# Patient Record
Sex: Female | Born: 1970 | Race: Black or African American | Hispanic: No | Marital: Single | Smoking: Never smoker
Health system: Southern US, Community
[De-identification: ages and names within clinical notes are randomized; demographics above are authoritative.]

## PROBLEM LIST (undated history)

## (undated) DIAGNOSIS — N289 Disorder of kidney and ureter, unspecified: Secondary | ICD-10-CM

## (undated) DIAGNOSIS — R7881 Bacteremia: Secondary | ICD-10-CM

## (undated) DIAGNOSIS — I5189 Other ill-defined heart diseases: Secondary | ICD-10-CM

## (undated) DIAGNOSIS — E7800 Pure hypercholesterolemia, unspecified: Secondary | ICD-10-CM

## (undated) DIAGNOSIS — N186 End stage renal disease: Secondary | ICD-10-CM

## (undated) DIAGNOSIS — M1990 Unspecified osteoarthritis, unspecified site: Secondary | ICD-10-CM

## (undated) DIAGNOSIS — I161 Hypertensive emergency: Secondary | ICD-10-CM

## (undated) DIAGNOSIS — M109 Gout, unspecified: Secondary | ICD-10-CM

## (undated) DIAGNOSIS — E119 Type 2 diabetes mellitus without complications: Secondary | ICD-10-CM

## (undated) DIAGNOSIS — I340 Nonrheumatic mitral (valve) insufficiency: Secondary | ICD-10-CM

## (undated) DIAGNOSIS — T829XXD Unspecified complication of cardiac and vascular prosthetic device, implant and graft, subsequent encounter: Secondary | ICD-10-CM

## (undated) DIAGNOSIS — I10 Essential (primary) hypertension: Secondary | ICD-10-CM

## (undated) DIAGNOSIS — I5022 Chronic systolic (congestive) heart failure: Secondary | ICD-10-CM

## (undated) HISTORY — DX: Other ill-defined heart diseases: I51.89

## (undated) HISTORY — PX: AV FISTULA PLACEMENT: SHX1204

## (undated) HISTORY — DX: Chronic systolic (congestive) heart failure: I50.22

## (undated) HISTORY — DX: Nonrheumatic mitral (valve) insufficiency: I34.0

## (undated) HISTORY — DX: Other disorders of calcium metabolism: E83.59

---

## 2019-05-11 ENCOUNTER — Emergency Department (HOSPITAL_COMMUNITY)
Admission: EM | Admit: 2019-05-11 | Discharge: 2019-05-11 | Disposition: A | Discharge status: Home / Self Care | Payer: Medicare Other | Attending: Emergency Medicine | Admitting: Emergency Medicine

## 2019-05-11 ENCOUNTER — Other Ambulatory Visit: Payer: Self-pay

## 2019-05-11 ENCOUNTER — Emergency Department (HOSPITAL_COMMUNITY): Payer: Medicare Other

## 2019-05-11 ENCOUNTER — Encounter (HOSPITAL_COMMUNITY): Payer: Self-pay | Admitting: Emergency Medicine

## 2019-05-11 DIAGNOSIS — Z20822 Contact with and (suspected) exposure to covid-19: Secondary | ICD-10-CM | POA: Insufficient documentation

## 2019-05-11 DIAGNOSIS — Z992 Dependence on renal dialysis: Secondary | ICD-10-CM | POA: Insufficient documentation

## 2019-05-11 DIAGNOSIS — Z7984 Long term (current) use of oral hypoglycemic drugs: Secondary | ICD-10-CM | POA: Diagnosis not present

## 2019-05-11 DIAGNOSIS — N186 End stage renal disease: Secondary | ICD-10-CM | POA: Insufficient documentation

## 2019-05-11 DIAGNOSIS — E1122 Type 2 diabetes mellitus with diabetic chronic kidney disease: Secondary | ICD-10-CM | POA: Insufficient documentation

## 2019-05-11 DIAGNOSIS — R112 Nausea with vomiting, unspecified: Secondary | ICD-10-CM | POA: Diagnosis present

## 2019-05-11 DIAGNOSIS — Z79899 Other long term (current) drug therapy: Secondary | ICD-10-CM | POA: Diagnosis not present

## 2019-05-11 DIAGNOSIS — I120 Hypertensive chronic kidney disease with stage 5 chronic kidney disease or end stage renal disease: Secondary | ICD-10-CM | POA: Diagnosis not present

## 2019-05-11 HISTORY — DX: Disorder of kidney and ureter, unspecified: N28.9

## 2019-05-11 HISTORY — DX: Type 2 diabetes mellitus without complications: E11.9

## 2019-05-11 HISTORY — DX: Pure hypercholesterolemia, unspecified: E78.00

## 2019-05-11 HISTORY — DX: Essential (primary) hypertension: I10

## 2019-05-11 LAB — COMPREHENSIVE METABOLIC PANEL
ALT: 18 U/L (ref 0–44)
AST: 18 U/L (ref 15–41)
Albumin: 4 g/dL (ref 3.5–5.0)
Alkaline Phosphatase: 69 U/L (ref 38–126)
Anion gap: 14 (ref 5–15)
BUN: 44 mg/dL — ABNORMAL HIGH (ref 6–20)
CO2: 26 mmol/L (ref 22–32)
Calcium: 8.8 mg/dL — ABNORMAL LOW (ref 8.9–10.3)
Chloride: 95 mmol/L — ABNORMAL LOW (ref 98–111)
Creatinine, Ser: 10.25 mg/dL — ABNORMAL HIGH (ref 0.44–1.00)
GFR calc Af Amer: 5 mL/min — ABNORMAL LOW (ref >60)
GFR calc non Af Amer: 4 mL/min — ABNORMAL LOW (ref >60)
Glucose, Bld: 313 mg/dL — ABNORMAL HIGH (ref 70–99)
Potassium: 5.2 mmol/L — ABNORMAL HIGH (ref 3.5–5.1)
Sodium: 135 mmol/L (ref 135–145)
Total Bilirubin: 0.6 mg/dL (ref 0.3–1.2)
Total Protein: 7.8 g/dL (ref 6.5–8.1)

## 2019-05-11 LAB — CBC
HCT: 23.9 % — ABNORMAL LOW (ref 36.0–46.0)
Hemoglobin: 7.7 g/dL — ABNORMAL LOW (ref 12.0–15.0)
MCH: 30.4 pg (ref 26.0–34.0)
MCHC: 32.2 g/dL (ref 30.0–36.0)
MCV: 94.5 fL (ref 80.0–100.0)
Platelets: 164 10*3/uL (ref 150–400)
RBC: 2.53 MIL/uL — ABNORMAL LOW (ref 3.87–5.11)
RDW: 14.6 % (ref 11.5–15.5)
WBC: 9 10*3/uL (ref 4.0–10.5)
nRBC: 0 % (ref 0.0–0.2)

## 2019-05-11 LAB — LIPASE, BLOOD: Lipase: 64 U/L — ABNORMAL HIGH (ref 11–51)

## 2019-05-11 LAB — RESPIRATORY PANEL BY RT PCR (FLU A&B, COVID)
Influenza A by PCR: NEGATIVE
Influenza B by PCR: NEGATIVE
SARS Coronavirus 2 by RT PCR: NEGATIVE

## 2019-05-11 MED ORDER — ONDANSETRON HCL 4 MG PO TABS: 4.0000 mg | ORAL_TABLET | Freq: Four times a day (QID) | ORAL | 0 refills | Status: DC

## 2019-05-11 MED ORDER — ONDANSETRON HCL 4 MG/2ML IJ SOLN
4.0000 mg | Freq: Once | INTRAMUSCULAR | Status: AC
  Administered 2019-05-11: 4 mg via INTRAVENOUS
  Filled 2019-05-11: qty 2

## 2019-05-11 NOTE — ED Triage Notes (Signed)
Pt reports emesis, abd pain, frequent bowel movements. Pt reports had dialysis yesterday and was at dialysis today for sequential session but reports due to pt emesis did not want to perform dialysis. Pt reports dyspnea with exertion and reports " I get this way when I don't have dialysis."

## 2019-05-11 NOTE — ED Provider Notes (Signed)
[facility] EMERGENCY DEPARTMENT Provider Note   CSN: [id number] Arrival date & time: 05/11/19  1143     History Chief Complaint  Patient presents with  . Emesis    Courtney Gray is a 49 y.o. female.  Patient is a 49 year old obese female with past medical history of diabetes, hypercholesterolemia, hypertension, end-stage renal disease on dialysis presenting to the emergency department for nausea, vomiting for the last couple of days.  Patient reports that she completed dialysis yesterday and returned today for subsequent to have treatment which she is usually scheduled for but was told to come and be evaluated in the emergency department due to her symptoms.  She reports that she has had about 4 episodes of nonbloody vomiting in the last 2 days with generalized stomach upset.  She makes only a small amount of urine per day since dialysis.  Denies any diarrhea.  Her thinks she might have had a fever with a temporal thermometer today at dialysis.  Also reports congestion and runny nose for the last several days.  Denies any chest pain, shortness of breath, leg swelling, hemoptysis        Past Medical History:  Diagnosis Date  . Diabetes mellitus without complication ([place])   . High cholesterol   . Hypertension   . Renal disorder     There are no problems to display for this patient.   Past Surgical History:  Procedure Laterality Date  . AV FISTULA PLACEMENT     x2, unsuccessful.      OB History   No obstetric history on file.     History reviewed. No pertinent family history.  Social History   Tobacco Use  . Smoking status: Never Smoker  . Smokeless tobacco: Never Used  Substance Use Topics  . Alcohol use: Never  . Drug use: Never    Home Medications Prior to Admission medications   Medication Sig Start Date End Date Taking? Authorizing Provider  amLODipine (NORVASC) 10 MG tablet Take 1 tablet by mouth daily. 03/27/18  Yes [provider]    atorvastatin (LIPITOR) 10 MG tablet Take 10 mg by mouth at bedtime. 03/29/19  Yes [provider]  carvedilol (COREG) 25 MG tablet Take 25 mg by mouth 2 (two) times daily. 04/20/19  Yes [provider]  gabapentin (NEURONTIN) 100 MG capsule Take 1 capsule by mouth at bedtime. 04/20/19  Yes [provider]  glipiZIDE (GLUCOTROL) 5 MG tablet Take 5 mg by mouth 2 (two) times daily. 03/18/19  Yes [provider]  LANTUS SOLOSTAR 100 UNIT/ML Solostar Pen Inject 10 Units into the skin at bedtime. 03/18/19  Yes [provider]  lisinopril (ZESTRIL) 40 MG tablet Take 40 mg by mouth daily. 04/20/19  Yes [provider]  tiZANidine (ZANAFLEX) 4 MG tablet Take 4 mg by mouth 3 (three) times daily. 04/20/19  Yes [provider]  ondansetron (ZOFRAN) 4 MG tablet Take 1 tablet (4 mg total) by mouth every 6 (six) hours. 05/11/19   Alveria Apley, PA-C    Allergies    Benzonatate, Hydralazine, Hydrocodone, Morphine, and Oxycodone  Review of Systems   Review of Systems  Constitutional: Positive for fatigue and fever. Negative for appetite change and chills.  HENT: Positive for congestion, rhinorrhea and sinus pain.   Respiratory: Negative for cough and shortness of breath.   Cardiovascular: Negative for chest pain.  Gastrointestinal: Positive for abdominal pain, nausea and vomiting. Negative for diarrhea.  Genitourinary: Negative for dysuria and pelvic  pain.  Musculoskeletal: Negative for arthralgias and myalgias.  Skin: Negative for rash.  Neurological: Negative for dizziness, light-headedness and headaches.  Hematological: Does not bruise/bleed easily.    Physical Exam Updated Vital Signs BP (!) 157/74   Pulse 66   Temp 98.9 F (37.2 C)   Resp 20   Ht 5\' 5"  (1.651 m)   Wt (!) 158.8 kg   SpO2 95%   BMI 58.24 kg/m   Physical Exam Vitals and nursing note reviewed.  Constitutional:      General: She is not in acute distress.     Appearance: Normal appearance. She is obese. She is not ill-appearing, toxic-appearing or diaphoretic.  HENT:     Head: Normocephalic and atraumatic.     Mouth/Throat:     Mouth: Mucous membranes are moist.  Eyes:     Conjunctiva/sclera: Conjunctivae normal.  Cardiovascular:     Rate and Rhythm: Normal rate and regular rhythm.     Pulses: Normal pulses.  Pulmonary:     Effort: Pulmonary effort is normal.     Breath sounds: Normal breath sounds.  Abdominal:     Palpations: Abdomen is soft.     Tenderness: There is abdominal tenderness (very mild and diffuse). There is no right CVA tenderness, left CVA tenderness, guarding or rebound.  Skin:    General: Skin is warm and dry.     Capillary Refill: Capillary refill takes less than 2 seconds.     Coloration: Skin is not jaundiced.     Findings: No bruising or erythema.  Neurological:     Mental Status: She is alert.  Psychiatric:        Mood and Affect: Mood normal.     ED Results / Procedures / Treatments   Labs (all labs ordered are listed, but only abnormal results are displayed) Labs Reviewed  LIPASE, BLOOD - Abnormal; Notable for the following components:      Result Value   Lipase 64 (*)    All other components within normal limits  COMPREHENSIVE METABOLIC PANEL - Abnormal; Notable for the following components:   Potassium 5.2 (*)    Chloride 95 (*)    Glucose, Bld 313 (*)    BUN 44 (*)    Creatinine, Ser 10.25 (*)    Calcium 8.8 (*)    GFR calc non Af Amer 4 (*)    GFR calc Af Amer 5 (*)    All other components within normal limits  CBC - Abnormal; Notable for the following components:   RBC 2.53 (*)    Hemoglobin 7.7 (*)    HCT 23.9 (*)    All other components within normal limits  RESPIRATORY PANEL BY RT PCR (FLU A&B, COVID)    EKG None  Radiology DG Chest Portable 1 View  Result Date: 05/11/2019 CLINICAL DATA:  Vomiting, abdominal pain, frequent bowel movements, had dialysis yesterday, congestion,  diabetes mellitus, hypertension EXAM: PORTABLE CHEST 1 VIEW COMPARISON:  Portable exam 1247 hours without priors for comparison FINDINGS: RIGHT jugular dual-lumen central venous catheter with tip projecting over high RIGHT atrium. Enlargement of cardiac silhouette with pulmonary vascular congestion. Lungs clear. No definite infiltrate, pleural effusion or pneumothorax. IMPRESSION: Enlargement of cardiac silhouette with pulmonary vascular congestion. No definite acute infiltrate. Electronically Signed   By: Lavonia Dana M.D.   On: 05/11/2019 13:01    Procedures Procedures (including critical care time)  Medications Ordered in ED Medications  ondansetron (ZOFRAN) injection 4 mg (4 mg Intravenous Given 05/11/19  1255)    ED Course  I have reviewed the triage vital signs and the nursing notes.  Pertinent labs & imaging results that were available during my care of the patient were reviewed by me and considered in my medical decision making (see chart for details).  Clinical Course as of May 12 821  Wed May 11, 2019  1357 49 y/o presenting from dialysis due to n/v and diffuse abd pain for the last few days. Patient feels like "I have a virus". Patient appears well on my exam. She is mildly hypertensive but this may be due to the fact that she is in need of some dialysis. She completed a session yesterday but was to go for an additional 2 hours today which is not unusual for her. Her workup is largely negative. She has no significant pain or peritoneal signs on my exam either. Discussed with Dr. Roderic Palau and we agree she is ok for d/c. Will send home with rx for zofran. She is due for dialysis tomorrow again and has appt set up   [KM]    Clinical Course User Index [KM] Kristine Royal   MDM Rules/Calculators/A&P                      Based on review of vitals, medical screening exam, lab work and/or imaging, there does not appear to be an acute, emergent etiology for the patient's symptoms.  Counseled pt on good return precautions and encouraged both PCP and ED follow-up as needed.  Prior to discharge, I also discussed incidental imaging findings with patient in detail and advised appropriate, recommended follow-up in detail.  Clinical Impression: 1. Non-intractable vomiting with nausea, unspecified vomiting type     Disposition: Discharge  Prior to providing a prescription for a controlled substance, I independently reviewed the patient's recent prescription history on the [facility]. The patient had no recent or regular prescriptions and was deemed appropriate for a brief, less than 3 day prescription of narcotic for acute analgesia.  This note was prepared with assistance of Systems analyst. Occasional wrong-word or sound-a-like substitutions may have occurred due to the inherent limitations of voice recognition software.  Final Clinical Impression(s) / ED Diagnoses Final diagnoses:  Non-intractable vomiting with nausea, unspecified vomiting type    Rx / DC Orders ED Discharge Orders         Ordered    ondansetron (ZOFRAN) 4 MG tablet  Every 6 hours     05/11/19 1409           Kristine Royal 05/13/19 [id number]    Milton Ferguson, MD 05/16/19 [phone]

## 2019-05-11 NOTE — Discharge Instructions (Signed)
Thank you for allowing me to care for you today. Please return to the emergency department if you have new or worsening symptoms. Take your medications as instructed.  ° °

## 2019-05-19 ENCOUNTER — Other Ambulatory Visit (HOSPITAL_COMMUNITY): Payer: Self-pay | Admitting: Nephrology

## 2019-05-19 DIAGNOSIS — Z992 Dependence on renal dialysis: Secondary | ICD-10-CM

## 2019-05-23 ENCOUNTER — Ambulatory Visit (HOSPITAL_COMMUNITY): Payer: Medicare Other

## 2019-05-26 ENCOUNTER — Other Ambulatory Visit: Payer: Self-pay | Admitting: Radiology

## 2019-05-27 ENCOUNTER — Encounter (HOSPITAL_COMMUNITY): Payer: Self-pay

## 2019-05-27 ENCOUNTER — Ambulatory Visit (HOSPITAL_COMMUNITY): Admission: RE | Admit: 2019-05-27 | Payer: Medicare Other | Source: Ambulatory Visit

## 2019-06-06 DIAGNOSIS — E8770 Fluid overload, unspecified: Secondary | ICD-10-CM | POA: Diagnosis not present

## 2019-06-06 DIAGNOSIS — E8779 Other fluid overload: Secondary | ICD-10-CM | POA: Diagnosis not present

## 2019-06-06 DIAGNOSIS — Z23 Encounter for immunization: Secondary | ICD-10-CM | POA: Diagnosis not present

## 2019-06-06 DIAGNOSIS — N186 End stage renal disease: Secondary | ICD-10-CM | POA: Diagnosis not present

## 2019-06-06 DIAGNOSIS — Z992 Dependence on renal dialysis: Secondary | ICD-10-CM | POA: Diagnosis not present

## 2019-06-07 DIAGNOSIS — Z23 Encounter for immunization: Secondary | ICD-10-CM | POA: Diagnosis not present

## 2019-06-07 DIAGNOSIS — E8770 Fluid overload, unspecified: Secondary | ICD-10-CM | POA: Diagnosis not present

## 2019-06-07 DIAGNOSIS — N186 End stage renal disease: Secondary | ICD-10-CM | POA: Diagnosis not present

## 2019-06-07 DIAGNOSIS — Z992 Dependence on renal dialysis: Secondary | ICD-10-CM | POA: Diagnosis not present

## 2019-06-07 DIAGNOSIS — E8779 Other fluid overload: Secondary | ICD-10-CM | POA: Diagnosis not present

## 2019-06-07 DIAGNOSIS — N2581 Secondary hyperparathyroidism of renal origin: Secondary | ICD-10-CM | POA: Diagnosis not present

## 2019-06-09 DIAGNOSIS — E8770 Fluid overload, unspecified: Secondary | ICD-10-CM | POA: Diagnosis not present

## 2019-06-09 DIAGNOSIS — E8779 Other fluid overload: Secondary | ICD-10-CM | POA: Diagnosis not present

## 2019-06-09 DIAGNOSIS — N186 End stage renal disease: Secondary | ICD-10-CM | POA: Diagnosis not present

## 2019-06-09 DIAGNOSIS — Z23 Encounter for immunization: Secondary | ICD-10-CM | POA: Diagnosis not present

## 2019-06-09 DIAGNOSIS — Z992 Dependence on renal dialysis: Secondary | ICD-10-CM | POA: Diagnosis not present

## 2019-06-11 DIAGNOSIS — E8779 Other fluid overload: Secondary | ICD-10-CM | POA: Diagnosis not present

## 2019-06-11 DIAGNOSIS — Z23 Encounter for immunization: Secondary | ICD-10-CM | POA: Diagnosis not present

## 2019-06-11 DIAGNOSIS — E8770 Fluid overload, unspecified: Secondary | ICD-10-CM | POA: Diagnosis not present

## 2019-06-11 DIAGNOSIS — N186 End stage renal disease: Secondary | ICD-10-CM | POA: Diagnosis not present

## 2019-06-11 DIAGNOSIS — Z992 Dependence on renal dialysis: Secondary | ICD-10-CM | POA: Diagnosis not present

## 2019-06-13 ENCOUNTER — Ambulatory Visit: Payer: Medicare Other | Admitting: "Endocrinology

## 2019-06-14 DIAGNOSIS — E8770 Fluid overload, unspecified: Secondary | ICD-10-CM | POA: Diagnosis not present

## 2019-06-14 DIAGNOSIS — N186 End stage renal disease: Secondary | ICD-10-CM | POA: Diagnosis not present

## 2019-06-14 DIAGNOSIS — Z23 Encounter for immunization: Secondary | ICD-10-CM | POA: Diagnosis not present

## 2019-06-14 DIAGNOSIS — E8779 Other fluid overload: Secondary | ICD-10-CM | POA: Diagnosis not present

## 2019-06-14 DIAGNOSIS — Z992 Dependence on renal dialysis: Secondary | ICD-10-CM | POA: Diagnosis not present

## 2019-06-14 DIAGNOSIS — N2581 Secondary hyperparathyroidism of renal origin: Secondary | ICD-10-CM | POA: Diagnosis not present

## 2019-06-16 DIAGNOSIS — Z992 Dependence on renal dialysis: Secondary | ICD-10-CM | POA: Diagnosis not present

## 2019-06-16 DIAGNOSIS — N186 End stage renal disease: Secondary | ICD-10-CM | POA: Diagnosis not present

## 2019-06-16 DIAGNOSIS — Z23 Encounter for immunization: Secondary | ICD-10-CM | POA: Diagnosis not present

## 2019-06-16 DIAGNOSIS — E8770 Fluid overload, unspecified: Secondary | ICD-10-CM | POA: Diagnosis not present

## 2019-06-16 DIAGNOSIS — E8779 Other fluid overload: Secondary | ICD-10-CM | POA: Diagnosis not present

## 2019-06-17 DIAGNOSIS — E1165 Type 2 diabetes mellitus with hyperglycemia: Secondary | ICD-10-CM | POA: Diagnosis not present

## 2019-06-17 DIAGNOSIS — Z299 Encounter for prophylactic measures, unspecified: Secondary | ICD-10-CM | POA: Diagnosis not present

## 2019-06-17 DIAGNOSIS — I10 Essential (primary) hypertension: Secondary | ICD-10-CM | POA: Diagnosis not present

## 2019-06-17 DIAGNOSIS — N186 End stage renal disease: Secondary | ICD-10-CM | POA: Diagnosis not present

## 2019-06-17 DIAGNOSIS — K0889 Other specified disorders of teeth and supporting structures: Secondary | ICD-10-CM | POA: Diagnosis not present

## 2019-06-17 DIAGNOSIS — E1122 Type 2 diabetes mellitus with diabetic chronic kidney disease: Secondary | ICD-10-CM | POA: Diagnosis not present

## 2019-06-18 DIAGNOSIS — E8779 Other fluid overload: Secondary | ICD-10-CM | POA: Diagnosis not present

## 2019-06-18 DIAGNOSIS — N186 End stage renal disease: Secondary | ICD-10-CM | POA: Diagnosis not present

## 2019-06-18 DIAGNOSIS — E8770 Fluid overload, unspecified: Secondary | ICD-10-CM | POA: Diagnosis not present

## 2019-06-18 DIAGNOSIS — Z23 Encounter for immunization: Secondary | ICD-10-CM | POA: Diagnosis not present

## 2019-06-18 DIAGNOSIS — Z992 Dependence on renal dialysis: Secondary | ICD-10-CM | POA: Diagnosis not present

## 2019-06-21 DIAGNOSIS — E8770 Fluid overload, unspecified: Secondary | ICD-10-CM | POA: Diagnosis not present

## 2019-06-21 DIAGNOSIS — Z23 Encounter for immunization: Secondary | ICD-10-CM | POA: Diagnosis not present

## 2019-06-21 DIAGNOSIS — N186 End stage renal disease: Secondary | ICD-10-CM | POA: Diagnosis not present

## 2019-06-21 DIAGNOSIS — Z992 Dependence on renal dialysis: Secondary | ICD-10-CM | POA: Diagnosis not present

## 2019-06-21 DIAGNOSIS — E8779 Other fluid overload: Secondary | ICD-10-CM | POA: Diagnosis not present

## 2019-06-23 DIAGNOSIS — N186 End stage renal disease: Secondary | ICD-10-CM | POA: Diagnosis not present

## 2019-06-23 DIAGNOSIS — Z23 Encounter for immunization: Secondary | ICD-10-CM | POA: Diagnosis not present

## 2019-06-23 DIAGNOSIS — E8779 Other fluid overload: Secondary | ICD-10-CM | POA: Diagnosis not present

## 2019-06-23 DIAGNOSIS — Z992 Dependence on renal dialysis: Secondary | ICD-10-CM | POA: Diagnosis not present

## 2019-06-23 DIAGNOSIS — E8770 Fluid overload, unspecified: Secondary | ICD-10-CM | POA: Diagnosis not present

## 2019-06-25 DIAGNOSIS — Z992 Dependence on renal dialysis: Secondary | ICD-10-CM | POA: Diagnosis not present

## 2019-06-25 DIAGNOSIS — N186 End stage renal disease: Secondary | ICD-10-CM | POA: Diagnosis not present

## 2019-06-25 DIAGNOSIS — E8779 Other fluid overload: Secondary | ICD-10-CM | POA: Diagnosis not present

## 2019-06-25 DIAGNOSIS — Z23 Encounter for immunization: Secondary | ICD-10-CM | POA: Diagnosis not present

## 2019-06-25 DIAGNOSIS — E8770 Fluid overload, unspecified: Secondary | ICD-10-CM | POA: Diagnosis not present

## 2019-06-28 DIAGNOSIS — N186 End stage renal disease: Secondary | ICD-10-CM | POA: Diagnosis not present

## 2019-06-28 DIAGNOSIS — D509 Iron deficiency anemia, unspecified: Secondary | ICD-10-CM | POA: Diagnosis not present

## 2019-06-28 DIAGNOSIS — E8770 Fluid overload, unspecified: Secondary | ICD-10-CM | POA: Diagnosis not present

## 2019-06-28 DIAGNOSIS — Z992 Dependence on renal dialysis: Secondary | ICD-10-CM | POA: Diagnosis not present

## 2019-06-28 DIAGNOSIS — Z23 Encounter for immunization: Secondary | ICD-10-CM | POA: Diagnosis not present

## 2019-06-28 DIAGNOSIS — E119 Type 2 diabetes mellitus without complications: Secondary | ICD-10-CM | POA: Diagnosis not present

## 2019-06-28 DIAGNOSIS — E8779 Other fluid overload: Secondary | ICD-10-CM | POA: Diagnosis not present

## 2019-06-30 DIAGNOSIS — N186 End stage renal disease: Secondary | ICD-10-CM | POA: Diagnosis not present

## 2019-06-30 DIAGNOSIS — Z23 Encounter for immunization: Secondary | ICD-10-CM | POA: Diagnosis not present

## 2019-06-30 DIAGNOSIS — E8770 Fluid overload, unspecified: Secondary | ICD-10-CM | POA: Diagnosis not present

## 2019-06-30 DIAGNOSIS — Z992 Dependence on renal dialysis: Secondary | ICD-10-CM | POA: Diagnosis not present

## 2019-06-30 DIAGNOSIS — E8779 Other fluid overload: Secondary | ICD-10-CM | POA: Diagnosis not present

## 2019-07-01 ENCOUNTER — Encounter: Payer: Self-pay | Admitting: Vascular Surgery

## 2019-07-01 ENCOUNTER — Other Ambulatory Visit: Payer: Self-pay

## 2019-07-01 ENCOUNTER — Ambulatory Visit (INDEPENDENT_AMBULATORY_CARE_PROVIDER_SITE_OTHER): Payer: Medicare Other | Admitting: Vascular Surgery

## 2019-07-01 VITALS — BP 186/92 | HR 83 | Temp 97.6°F | Resp 16 | Ht 65.0 in | Wt 367.0 lb

## 2019-07-01 DIAGNOSIS — N186 End stage renal disease: Secondary | ICD-10-CM | POA: Diagnosis not present

## 2019-07-01 NOTE — Progress Notes (Signed)
Patient ID: Courtney Gray, female   DOB: 10/29/1970, 49 y.o.   MRN: [id number]  Reason for Consult: New Patient (Initial Visit) (to schedule fistulagram)   Referred by Anthonette Legato, MD  Subjective:     HPI:  Courtney Gray is a 49 y.o. female history of end-stage she states that she is now dialyzing in [place] after moving from [place].  Previous dialysis access placed twice in her right upper extremity which appears to be 1 fistula and a revision.  Currently on dialysis via catheter.  She is left-hand dominant.  No previous left arm access procedures.  She has had multiple infiltration events in the right upper extremity  Past Medical History:  Diagnosis Date  . Diabetes mellitus without complication ([place])   . High cholesterol   . Hypertension   . Renal disorder    History reviewed. No pertinent family history. Past Surgical History:  Procedure Laterality Date  . AV FISTULA PLACEMENT     x2, unsuccessful.     Short Social History:  Social History   Tobacco Use  . Smoking status: Never Smoker  . Smokeless tobacco: Never Used  Substance Use Topics  . Alcohol use: Never    Allergies  Allergen Reactions  . Benzonatate     Other reaction(s): Hallucinations  . Hydralazine     Other reaction(s): Hallucinations  . No Known Allergies   . Hydrocodone Itching  . Morphine Itching  . Oxycodone Itching    Current Outpatient Medications  Medication Sig Dispense Refill  . amLODipine (NORVASC) 10 MG tablet Take 1 tablet by mouth daily.    Marland Kitchen atorvastatin (LIPITOR) 10 MG tablet Take 10 mg by mouth at bedtime.    . carvedilol (COREG) 25 MG tablet Take 25 mg by mouth 2 (two) times daily.    Marland Kitchen gabapentin (NEURONTIN) 100 MG capsule Take 1 capsule by mouth at bedtime.    Marland Kitchen glipiZIDE (GLUCOTROL) 5 MG tablet Take 5 mg by mouth 2 (two) times daily.    Marland Kitchen LANTUS SOLOSTAR 100 UNIT/ML Solostar Pen Inject 10 Units into the skin at bedtime.    Marland Kitchen lisinopril (ZESTRIL) 40 MG tablet  Take 40 mg by mouth daily.    . ondansetron (ZOFRAN) 4 MG tablet Take 1 tablet (4 mg total) by mouth every 6 (six) hours. 12 tablet 0  . tiZANidine (ZANAFLEX) 4 MG tablet Take 4 mg by mouth 3 (three) times daily.     No current facility-administered medications for this visit.    Review of Systems  Constitutional:  Constitutional negative. HENT: HENT negative.  Eyes: Eyes negative.  Respiratory: Respiratory negative.  Cardiovascular: Cardiovascular negative.  GI: Gastrointestinal negative.  Musculoskeletal: Musculoskeletal negative.  Skin: Skin negative.  Neurological: Neurological negative. Hematologic: Hematologic/lymphatic negative.  Psychiatric: Psychiatric negative.        Objective:  Objective   Vitals:   07/01/19 1045  BP: (!) 186/92  Pulse: 83  Resp: 16  Temp: 97.6 F (36.4 C)  TempSrc: Temporal  SpO2: 99%  Weight: (!) 367 lb (166.5 kg)  Height: 5\' 5"  (1.651 m)   Body mass index is 61.07 kg/m.  Physical Exam HENT:     Head: Normocephalic.     Nose: Nose normal.  Eyes:     Pupils: Pupils are equal, round, and reactive to light.  Pulmonary:     Effort: Pulmonary effort is normal.  Abdominal:     General: Abdomen is flat.     Palpations: Abdomen is soft.  Musculoskeletal:  General: Swelling present. Normal range of motion.     Comments: There is pulsatility pulsatility in the right upper extremity but appears to be too deep for cannulation  Skin:    General: Skin is warm.     Capillary Refill: Capillary refill takes less than 2 seconds.  Neurological:     General: No focal deficit present.     Mental Status: She is alert.  Psychiatric:        Mood and Affect: Mood normal.        Behavior: Behavior normal.        Thought Content: Thought content normal.        Judgment: Judgment normal.     Data: No studies today     Assessment/Plan:     49 year old female on dialysis via tunneled dialysis catheter.  Has a previous what appears to be  right arm cephalic vein fistula creation with superficialization.  This has been infiltrated has never really worked.  I have recommended fistulogram with further procedures from there which would possibly be revision of the fistula versus conversion to graft in the right upper extremity which could be placed more lateral on the arm and would be less likely to be infiltrated.  At the very worst we would have to consider left arm access which is her dominant arm.  She demonstrates good understanding we will get her scheduled today.     Waynetta Sandy MD [facility]

## 2019-07-02 DIAGNOSIS — E8779 Other fluid overload: Secondary | ICD-10-CM | POA: Diagnosis not present

## 2019-07-02 DIAGNOSIS — Z23 Encounter for immunization: Secondary | ICD-10-CM | POA: Diagnosis not present

## 2019-07-02 DIAGNOSIS — E8770 Fluid overload, unspecified: Secondary | ICD-10-CM | POA: Diagnosis not present

## 2019-07-02 DIAGNOSIS — Z992 Dependence on renal dialysis: Secondary | ICD-10-CM | POA: Diagnosis not present

## 2019-07-02 DIAGNOSIS — N186 End stage renal disease: Secondary | ICD-10-CM | POA: Diagnosis not present

## 2019-07-02 DIAGNOSIS — E785 Hyperlipidemia, unspecified: Secondary | ICD-10-CM | POA: Diagnosis not present

## 2019-07-05 DIAGNOSIS — D509 Iron deficiency anemia, unspecified: Secondary | ICD-10-CM | POA: Diagnosis not present

## 2019-07-05 DIAGNOSIS — N2581 Secondary hyperparathyroidism of renal origin: Secondary | ICD-10-CM | POA: Diagnosis not present

## 2019-07-05 DIAGNOSIS — Z23 Encounter for immunization: Secondary | ICD-10-CM | POA: Diagnosis not present

## 2019-07-05 DIAGNOSIS — Z992 Dependence on renal dialysis: Secondary | ICD-10-CM | POA: Diagnosis not present

## 2019-07-05 DIAGNOSIS — E8770 Fluid overload, unspecified: Secondary | ICD-10-CM | POA: Diagnosis not present

## 2019-07-05 DIAGNOSIS — E8779 Other fluid overload: Secondary | ICD-10-CM | POA: Diagnosis not present

## 2019-07-05 DIAGNOSIS — N186 End stage renal disease: Secondary | ICD-10-CM | POA: Diagnosis not present

## 2019-07-06 DIAGNOSIS — N186 End stage renal disease: Secondary | ICD-10-CM | POA: Diagnosis not present

## 2019-07-06 DIAGNOSIS — Z992 Dependence on renal dialysis: Secondary | ICD-10-CM | POA: Diagnosis not present

## 2019-07-07 DIAGNOSIS — Z9111 Patient's noncompliance with dietary regimen: Secondary | ICD-10-CM | POA: Diagnosis not present

## 2019-07-07 DIAGNOSIS — Z992 Dependence on renal dialysis: Secondary | ICD-10-CM | POA: Diagnosis not present

## 2019-07-07 DIAGNOSIS — N186 End stage renal disease: Secondary | ICD-10-CM | POA: Diagnosis not present

## 2019-07-07 DIAGNOSIS — E8770 Fluid overload, unspecified: Secondary | ICD-10-CM | POA: Diagnosis not present

## 2019-07-07 DIAGNOSIS — Z23 Encounter for immunization: Secondary | ICD-10-CM | POA: Diagnosis not present

## 2019-07-07 DIAGNOSIS — E8779 Other fluid overload: Secondary | ICD-10-CM | POA: Diagnosis not present

## 2019-07-08 ENCOUNTER — Other Ambulatory Visit (HOSPITAL_COMMUNITY)
Admission: RE | Admit: 2019-07-08 | Discharge: 2019-07-08 | Disposition: A | Discharge status: Home / Self Care | Payer: Medicare Other | Source: Ambulatory Visit | Attending: Vascular Surgery | Admitting: Vascular Surgery

## 2019-07-09 DIAGNOSIS — Z23 Encounter for immunization: Secondary | ICD-10-CM | POA: Diagnosis not present

## 2019-07-09 DIAGNOSIS — E8770 Fluid overload, unspecified: Secondary | ICD-10-CM | POA: Diagnosis not present

## 2019-07-09 DIAGNOSIS — E8779 Other fluid overload: Secondary | ICD-10-CM | POA: Diagnosis not present

## 2019-07-09 DIAGNOSIS — N186 End stage renal disease: Secondary | ICD-10-CM | POA: Diagnosis not present

## 2019-07-09 DIAGNOSIS — Z992 Dependence on renal dialysis: Secondary | ICD-10-CM | POA: Diagnosis not present

## 2019-07-09 DIAGNOSIS — Z9111 Patient's noncompliance with dietary regimen: Secondary | ICD-10-CM | POA: Diagnosis not present

## 2019-07-11 ENCOUNTER — Encounter (HOSPITAL_COMMUNITY): Admission: RE | Payer: Self-pay | Source: Home / Self Care

## 2019-07-11 ENCOUNTER — Ambulatory Visit (HOSPITAL_COMMUNITY): Admission: RE | Admit: 2019-07-11 | Payer: Medicare Other | Source: Home / Self Care | Admitting: Vascular Surgery

## 2019-07-11 SURGERY — A/V FISTULAGRAM
Anesthesia: LOCAL | Laterality: Right

## 2019-07-12 DIAGNOSIS — Z23 Encounter for immunization: Secondary | ICD-10-CM | POA: Diagnosis not present

## 2019-07-12 DIAGNOSIS — N186 End stage renal disease: Secondary | ICD-10-CM | POA: Diagnosis not present

## 2019-07-12 DIAGNOSIS — Z9111 Patient's noncompliance with dietary regimen: Secondary | ICD-10-CM | POA: Diagnosis not present

## 2019-07-12 DIAGNOSIS — Z992 Dependence on renal dialysis: Secondary | ICD-10-CM | POA: Diagnosis not present

## 2019-07-12 DIAGNOSIS — E8770 Fluid overload, unspecified: Secondary | ICD-10-CM | POA: Diagnosis not present

## 2019-07-12 DIAGNOSIS — E8779 Other fluid overload: Secondary | ICD-10-CM | POA: Diagnosis not present

## 2019-07-14 DIAGNOSIS — Z992 Dependence on renal dialysis: Secondary | ICD-10-CM | POA: Diagnosis not present

## 2019-07-14 DIAGNOSIS — Z9111 Patient's noncompliance with dietary regimen: Secondary | ICD-10-CM | POA: Diagnosis not present

## 2019-07-14 DIAGNOSIS — Z23 Encounter for immunization: Secondary | ICD-10-CM | POA: Diagnosis not present

## 2019-07-14 DIAGNOSIS — E8770 Fluid overload, unspecified: Secondary | ICD-10-CM | POA: Diagnosis not present

## 2019-07-14 DIAGNOSIS — N186 End stage renal disease: Secondary | ICD-10-CM | POA: Diagnosis not present

## 2019-07-14 DIAGNOSIS — E8779 Other fluid overload: Secondary | ICD-10-CM | POA: Diagnosis not present

## 2019-07-16 DIAGNOSIS — E8779 Other fluid overload: Secondary | ICD-10-CM | POA: Diagnosis not present

## 2019-07-16 DIAGNOSIS — Z992 Dependence on renal dialysis: Secondary | ICD-10-CM | POA: Diagnosis not present

## 2019-07-16 DIAGNOSIS — E8770 Fluid overload, unspecified: Secondary | ICD-10-CM | POA: Diagnosis not present

## 2019-07-16 DIAGNOSIS — N186 End stage renal disease: Secondary | ICD-10-CM | POA: Diagnosis not present

## 2019-07-16 DIAGNOSIS — Z9111 Patient's noncompliance with dietary regimen: Secondary | ICD-10-CM | POA: Diagnosis not present

## 2019-07-16 DIAGNOSIS — Z23 Encounter for immunization: Secondary | ICD-10-CM | POA: Diagnosis not present

## 2019-07-18 DIAGNOSIS — N186 End stage renal disease: Secondary | ICD-10-CM | POA: Diagnosis not present

## 2019-07-18 DIAGNOSIS — E8779 Other fluid overload: Secondary | ICD-10-CM | POA: Diagnosis not present

## 2019-07-18 DIAGNOSIS — Z23 Encounter for immunization: Secondary | ICD-10-CM | POA: Diagnosis not present

## 2019-07-18 DIAGNOSIS — Z992 Dependence on renal dialysis: Secondary | ICD-10-CM | POA: Diagnosis not present

## 2019-07-18 DIAGNOSIS — E8770 Fluid overload, unspecified: Secondary | ICD-10-CM | POA: Diagnosis not present

## 2019-07-18 DIAGNOSIS — Z9111 Patient's noncompliance with dietary regimen: Secondary | ICD-10-CM | POA: Diagnosis not present

## 2019-07-19 DIAGNOSIS — Z992 Dependence on renal dialysis: Secondary | ICD-10-CM | POA: Diagnosis not present

## 2019-07-19 DIAGNOSIS — N2581 Secondary hyperparathyroidism of renal origin: Secondary | ICD-10-CM | POA: Diagnosis not present

## 2019-07-19 DIAGNOSIS — Z9111 Patient's noncompliance with dietary regimen: Secondary | ICD-10-CM | POA: Diagnosis not present

## 2019-07-19 DIAGNOSIS — Z23 Encounter for immunization: Secondary | ICD-10-CM | POA: Diagnosis not present

## 2019-07-19 DIAGNOSIS — E8779 Other fluid overload: Secondary | ICD-10-CM | POA: Diagnosis not present

## 2019-07-19 DIAGNOSIS — E8770 Fluid overload, unspecified: Secondary | ICD-10-CM | POA: Diagnosis not present

## 2019-07-19 DIAGNOSIS — N186 End stage renal disease: Secondary | ICD-10-CM | POA: Diagnosis not present

## 2019-07-21 DIAGNOSIS — Z992 Dependence on renal dialysis: Secondary | ICD-10-CM | POA: Diagnosis not present

## 2019-07-21 DIAGNOSIS — Z9111 Patient's noncompliance with dietary regimen: Secondary | ICD-10-CM | POA: Diagnosis not present

## 2019-07-21 DIAGNOSIS — Z23 Encounter for immunization: Secondary | ICD-10-CM | POA: Diagnosis not present

## 2019-07-21 DIAGNOSIS — N186 End stage renal disease: Secondary | ICD-10-CM | POA: Diagnosis not present

## 2019-07-21 DIAGNOSIS — E8779 Other fluid overload: Secondary | ICD-10-CM | POA: Diagnosis not present

## 2019-07-21 DIAGNOSIS — E8770 Fluid overload, unspecified: Secondary | ICD-10-CM | POA: Diagnosis not present

## 2019-07-23 DIAGNOSIS — Z992 Dependence on renal dialysis: Secondary | ICD-10-CM | POA: Diagnosis not present

## 2019-07-23 DIAGNOSIS — Z23 Encounter for immunization: Secondary | ICD-10-CM | POA: Diagnosis not present

## 2019-07-23 DIAGNOSIS — Z9111 Patient's noncompliance with dietary regimen: Secondary | ICD-10-CM | POA: Diagnosis not present

## 2019-07-23 DIAGNOSIS — E8770 Fluid overload, unspecified: Secondary | ICD-10-CM | POA: Diagnosis not present

## 2019-07-23 DIAGNOSIS — E8779 Other fluid overload: Secondary | ICD-10-CM | POA: Diagnosis not present

## 2019-07-23 DIAGNOSIS — N186 End stage renal disease: Secondary | ICD-10-CM | POA: Diagnosis not present

## 2019-07-26 DIAGNOSIS — Z9111 Patient's noncompliance with dietary regimen: Secondary | ICD-10-CM | POA: Diagnosis not present

## 2019-07-26 DIAGNOSIS — E8770 Fluid overload, unspecified: Secondary | ICD-10-CM | POA: Diagnosis not present

## 2019-07-26 DIAGNOSIS — Z992 Dependence on renal dialysis: Secondary | ICD-10-CM | POA: Diagnosis not present

## 2019-07-26 DIAGNOSIS — N186 End stage renal disease: Secondary | ICD-10-CM | POA: Diagnosis not present

## 2019-07-26 DIAGNOSIS — Z23 Encounter for immunization: Secondary | ICD-10-CM | POA: Diagnosis not present

## 2019-07-26 DIAGNOSIS — E8779 Other fluid overload: Secondary | ICD-10-CM | POA: Diagnosis not present

## 2019-07-28 DIAGNOSIS — E8779 Other fluid overload: Secondary | ICD-10-CM | POA: Diagnosis not present

## 2019-07-28 DIAGNOSIS — Z9111 Patient's noncompliance with dietary regimen: Secondary | ICD-10-CM | POA: Diagnosis not present

## 2019-07-28 DIAGNOSIS — Z23 Encounter for immunization: Secondary | ICD-10-CM | POA: Diagnosis not present

## 2019-07-28 DIAGNOSIS — Z992 Dependence on renal dialysis: Secondary | ICD-10-CM | POA: Diagnosis not present

## 2019-07-28 DIAGNOSIS — E8770 Fluid overload, unspecified: Secondary | ICD-10-CM | POA: Diagnosis not present

## 2019-07-28 DIAGNOSIS — N186 End stage renal disease: Secondary | ICD-10-CM | POA: Diagnosis not present

## 2019-07-30 DIAGNOSIS — Z992 Dependence on renal dialysis: Secondary | ICD-10-CM | POA: Diagnosis not present

## 2019-07-30 DIAGNOSIS — Z9111 Patient's noncompliance with dietary regimen: Secondary | ICD-10-CM | POA: Diagnosis not present

## 2019-07-30 DIAGNOSIS — N186 End stage renal disease: Secondary | ICD-10-CM | POA: Diagnosis not present

## 2019-07-30 DIAGNOSIS — Z23 Encounter for immunization: Secondary | ICD-10-CM | POA: Diagnosis not present

## 2019-07-30 DIAGNOSIS — E8770 Fluid overload, unspecified: Secondary | ICD-10-CM | POA: Diagnosis not present

## 2019-07-30 DIAGNOSIS — E8779 Other fluid overload: Secondary | ICD-10-CM | POA: Diagnosis not present

## 2019-08-02 DIAGNOSIS — N186 End stage renal disease: Secondary | ICD-10-CM | POA: Diagnosis not present

## 2019-08-02 DIAGNOSIS — E8779 Other fluid overload: Secondary | ICD-10-CM | POA: Diagnosis not present

## 2019-08-02 DIAGNOSIS — Z23 Encounter for immunization: Secondary | ICD-10-CM | POA: Diagnosis not present

## 2019-08-02 DIAGNOSIS — E8770 Fluid overload, unspecified: Secondary | ICD-10-CM | POA: Diagnosis not present

## 2019-08-02 DIAGNOSIS — Z9111 Patient's noncompliance with dietary regimen: Secondary | ICD-10-CM | POA: Diagnosis not present

## 2019-08-02 DIAGNOSIS — D509 Iron deficiency anemia, unspecified: Secondary | ICD-10-CM | POA: Diagnosis not present

## 2019-08-02 DIAGNOSIS — N2581 Secondary hyperparathyroidism of renal origin: Secondary | ICD-10-CM | POA: Diagnosis not present

## 2019-08-02 DIAGNOSIS — Z992 Dependence on renal dialysis: Secondary | ICD-10-CM | POA: Diagnosis not present

## 2019-08-04 DIAGNOSIS — N186 End stage renal disease: Secondary | ICD-10-CM | POA: Diagnosis not present

## 2019-08-04 DIAGNOSIS — E8779 Other fluid overload: Secondary | ICD-10-CM | POA: Diagnosis not present

## 2019-08-04 DIAGNOSIS — Z992 Dependence on renal dialysis: Secondary | ICD-10-CM | POA: Diagnosis not present

## 2019-08-04 DIAGNOSIS — Z23 Encounter for immunization: Secondary | ICD-10-CM | POA: Diagnosis not present

## 2019-08-04 DIAGNOSIS — Z9111 Patient's noncompliance with dietary regimen: Secondary | ICD-10-CM | POA: Diagnosis not present

## 2019-08-04 DIAGNOSIS — E8770 Fluid overload, unspecified: Secondary | ICD-10-CM | POA: Diagnosis not present

## 2019-08-05 DIAGNOSIS — E8770 Fluid overload, unspecified: Secondary | ICD-10-CM | POA: Diagnosis not present

## 2019-08-05 DIAGNOSIS — Z7689 Persons encountering health services in other specified circumstances: Secondary | ICD-10-CM | POA: Diagnosis not present

## 2019-08-05 DIAGNOSIS — E8779 Other fluid overload: Secondary | ICD-10-CM | POA: Diagnosis not present

## 2019-08-05 DIAGNOSIS — N186 End stage renal disease: Secondary | ICD-10-CM | POA: Diagnosis not present

## 2019-08-05 DIAGNOSIS — Z299 Encounter for prophylactic measures, unspecified: Secondary | ICD-10-CM | POA: Diagnosis not present

## 2019-08-05 DIAGNOSIS — Z23 Encounter for immunization: Secondary | ICD-10-CM | POA: Diagnosis not present

## 2019-08-05 DIAGNOSIS — Z992 Dependence on renal dialysis: Secondary | ICD-10-CM | POA: Diagnosis not present

## 2019-08-05 DIAGNOSIS — Z9111 Patient's noncompliance with dietary regimen: Secondary | ICD-10-CM | POA: Diagnosis not present

## 2019-08-05 DIAGNOSIS — Z789 Other specified health status: Secondary | ICD-10-CM | POA: Diagnosis not present

## 2019-08-05 DIAGNOSIS — E1165 Type 2 diabetes mellitus with hyperglycemia: Secondary | ICD-10-CM | POA: Diagnosis not present

## 2019-08-05 DIAGNOSIS — K0889 Other specified disorders of teeth and supporting structures: Secondary | ICD-10-CM | POA: Diagnosis not present

## 2019-08-05 DIAGNOSIS — E7800 Pure hypercholesterolemia, unspecified: Secondary | ICD-10-CM | POA: Diagnosis not present

## 2019-08-06 DIAGNOSIS — Z992 Dependence on renal dialysis: Secondary | ICD-10-CM | POA: Diagnosis not present

## 2019-08-06 DIAGNOSIS — N186 End stage renal disease: Secondary | ICD-10-CM | POA: Diagnosis not present

## 2019-08-06 DIAGNOSIS — N2581 Secondary hyperparathyroidism of renal origin: Secondary | ICD-10-CM | POA: Diagnosis not present

## 2019-08-06 DIAGNOSIS — E8770 Fluid overload, unspecified: Secondary | ICD-10-CM | POA: Diagnosis not present

## 2019-08-09 DIAGNOSIS — N186 End stage renal disease: Secondary | ICD-10-CM | POA: Diagnosis not present

## 2019-08-09 DIAGNOSIS — N2581 Secondary hyperparathyroidism of renal origin: Secondary | ICD-10-CM | POA: Diagnosis not present

## 2019-08-09 DIAGNOSIS — E8770 Fluid overload, unspecified: Secondary | ICD-10-CM | POA: Diagnosis not present

## 2019-08-09 DIAGNOSIS — Z992 Dependence on renal dialysis: Secondary | ICD-10-CM | POA: Diagnosis not present

## 2019-08-11 DIAGNOSIS — N2581 Secondary hyperparathyroidism of renal origin: Secondary | ICD-10-CM | POA: Diagnosis not present

## 2019-08-11 DIAGNOSIS — Z992 Dependence on renal dialysis: Secondary | ICD-10-CM | POA: Diagnosis not present

## 2019-08-11 DIAGNOSIS — N186 End stage renal disease: Secondary | ICD-10-CM | POA: Diagnosis not present

## 2019-08-11 DIAGNOSIS — E8770 Fluid overload, unspecified: Secondary | ICD-10-CM | POA: Diagnosis not present

## 2019-08-13 DIAGNOSIS — Z992 Dependence on renal dialysis: Secondary | ICD-10-CM | POA: Diagnosis not present

## 2019-08-13 DIAGNOSIS — E8770 Fluid overload, unspecified: Secondary | ICD-10-CM | POA: Diagnosis not present

## 2019-08-13 DIAGNOSIS — N2581 Secondary hyperparathyroidism of renal origin: Secondary | ICD-10-CM | POA: Diagnosis not present

## 2019-08-13 DIAGNOSIS — N186 End stage renal disease: Secondary | ICD-10-CM | POA: Diagnosis not present

## 2019-08-15 DIAGNOSIS — Z299 Encounter for prophylactic measures, unspecified: Secondary | ICD-10-CM | POA: Diagnosis not present

## 2019-08-15 DIAGNOSIS — I10 Essential (primary) hypertension: Secondary | ICD-10-CM | POA: Diagnosis not present

## 2019-08-15 DIAGNOSIS — N186 End stage renal disease: Secondary | ICD-10-CM | POA: Diagnosis not present

## 2019-08-15 DIAGNOSIS — J069 Acute upper respiratory infection, unspecified: Secondary | ICD-10-CM | POA: Diagnosis not present

## 2019-08-15 DIAGNOSIS — Z992 Dependence on renal dialysis: Secondary | ICD-10-CM | POA: Diagnosis not present

## 2019-08-16 DIAGNOSIS — Z20822 Contact with and (suspected) exposure to covid-19: Secondary | ICD-10-CM | POA: Diagnosis not present

## 2019-08-16 DIAGNOSIS — Z885 Allergy status to narcotic agent status: Secondary | ICD-10-CM | POA: Diagnosis not present

## 2019-08-16 DIAGNOSIS — Z992 Dependence on renal dialysis: Secondary | ICD-10-CM | POA: Diagnosis not present

## 2019-08-16 DIAGNOSIS — Z7401 Bed confinement status: Secondary | ICD-10-CM | POA: Diagnosis not present

## 2019-08-16 DIAGNOSIS — I10 Essential (primary) hypertension: Secondary | ICD-10-CM | POA: Diagnosis not present

## 2019-08-16 DIAGNOSIS — R1110 Vomiting, unspecified: Secondary | ICD-10-CM | POA: Diagnosis not present

## 2019-08-16 DIAGNOSIS — I120 Hypertensive chronic kidney disease with stage 5 chronic kidney disease or end stage renal disease: Secondary | ICD-10-CM | POA: Diagnosis not present

## 2019-08-16 DIAGNOSIS — E875 Hyperkalemia: Secondary | ICD-10-CM | POA: Diagnosis not present

## 2019-08-16 DIAGNOSIS — N186 End stage renal disease: Secondary | ICD-10-CM | POA: Diagnosis not present

## 2019-08-16 DIAGNOSIS — E119 Type 2 diabetes mellitus without complications: Secondary | ICD-10-CM | POA: Diagnosis not present

## 2019-08-17 DIAGNOSIS — Z885 Allergy status to narcotic agent status: Secondary | ICD-10-CM | POA: Diagnosis not present

## 2019-08-17 DIAGNOSIS — Z992 Dependence on renal dialysis: Secondary | ICD-10-CM | POA: Diagnosis not present

## 2019-08-17 DIAGNOSIS — Z79899 Other long term (current) drug therapy: Secondary | ICD-10-CM | POA: Diagnosis not present

## 2019-08-17 DIAGNOSIS — E1122 Type 2 diabetes mellitus with diabetic chronic kidney disease: Secondary | ICD-10-CM | POA: Diagnosis not present

## 2019-08-17 DIAGNOSIS — Z7984 Long term (current) use of oral hypoglycemic drugs: Secondary | ICD-10-CM | POA: Diagnosis not present

## 2019-08-17 DIAGNOSIS — N186 End stage renal disease: Secondary | ICD-10-CM | POA: Diagnosis not present

## 2019-08-17 DIAGNOSIS — I120 Hypertensive chronic kidney disease with stage 5 chronic kidney disease or end stage renal disease: Secondary | ICD-10-CM | POA: Diagnosis not present

## 2019-08-17 DIAGNOSIS — E875 Hyperkalemia: Secondary | ICD-10-CM | POA: Diagnosis not present

## 2019-08-18 DIAGNOSIS — N2581 Secondary hyperparathyroidism of renal origin: Secondary | ICD-10-CM | POA: Diagnosis not present

## 2019-08-18 DIAGNOSIS — N186 End stage renal disease: Secondary | ICD-10-CM | POA: Diagnosis not present

## 2019-08-18 DIAGNOSIS — Z992 Dependence on renal dialysis: Secondary | ICD-10-CM | POA: Diagnosis not present

## 2019-08-18 DIAGNOSIS — E8770 Fluid overload, unspecified: Secondary | ICD-10-CM | POA: Diagnosis not present

## 2019-08-20 DIAGNOSIS — Z992 Dependence on renal dialysis: Secondary | ICD-10-CM | POA: Diagnosis not present

## 2019-08-20 DIAGNOSIS — N186 End stage renal disease: Secondary | ICD-10-CM | POA: Diagnosis not present

## 2019-08-20 DIAGNOSIS — N2581 Secondary hyperparathyroidism of renal origin: Secondary | ICD-10-CM | POA: Diagnosis not present

## 2019-08-20 DIAGNOSIS — E8770 Fluid overload, unspecified: Secondary | ICD-10-CM | POA: Diagnosis not present

## 2019-08-22 DIAGNOSIS — E8770 Fluid overload, unspecified: Secondary | ICD-10-CM | POA: Diagnosis not present

## 2019-08-22 DIAGNOSIS — Z992 Dependence on renal dialysis: Secondary | ICD-10-CM | POA: Diagnosis not present

## 2019-08-22 DIAGNOSIS — N2581 Secondary hyperparathyroidism of renal origin: Secondary | ICD-10-CM | POA: Diagnosis not present

## 2019-08-22 DIAGNOSIS — N186 End stage renal disease: Secondary | ICD-10-CM | POA: Diagnosis not present

## 2019-08-23 DIAGNOSIS — Z992 Dependence on renal dialysis: Secondary | ICD-10-CM | POA: Diagnosis not present

## 2019-08-23 DIAGNOSIS — E8770 Fluid overload, unspecified: Secondary | ICD-10-CM | POA: Diagnosis not present

## 2019-08-23 DIAGNOSIS — N2581 Secondary hyperparathyroidism of renal origin: Secondary | ICD-10-CM | POA: Diagnosis not present

## 2019-08-23 DIAGNOSIS — N186 End stage renal disease: Secondary | ICD-10-CM | POA: Diagnosis not present

## 2019-08-25 DIAGNOSIS — E8770 Fluid overload, unspecified: Secondary | ICD-10-CM | POA: Diagnosis not present

## 2019-08-25 DIAGNOSIS — Z992 Dependence on renal dialysis: Secondary | ICD-10-CM | POA: Diagnosis not present

## 2019-08-25 DIAGNOSIS — N186 End stage renal disease: Secondary | ICD-10-CM | POA: Diagnosis not present

## 2019-08-25 DIAGNOSIS — N2581 Secondary hyperparathyroidism of renal origin: Secondary | ICD-10-CM | POA: Diagnosis not present

## 2019-08-27 DIAGNOSIS — Z992 Dependence on renal dialysis: Secondary | ICD-10-CM | POA: Diagnosis not present

## 2019-08-27 DIAGNOSIS — N2581 Secondary hyperparathyroidism of renal origin: Secondary | ICD-10-CM | POA: Diagnosis not present

## 2019-08-27 DIAGNOSIS — N186 End stage renal disease: Secondary | ICD-10-CM | POA: Diagnosis not present

## 2019-08-27 DIAGNOSIS — E8770 Fluid overload, unspecified: Secondary | ICD-10-CM | POA: Diagnosis not present

## 2019-08-30 DIAGNOSIS — E8770 Fluid overload, unspecified: Secondary | ICD-10-CM | POA: Diagnosis not present

## 2019-08-30 DIAGNOSIS — N186 End stage renal disease: Secondary | ICD-10-CM | POA: Diagnosis not present

## 2019-08-30 DIAGNOSIS — Z992 Dependence on renal dialysis: Secondary | ICD-10-CM | POA: Diagnosis not present

## 2019-08-30 DIAGNOSIS — N2581 Secondary hyperparathyroidism of renal origin: Secondary | ICD-10-CM | POA: Diagnosis not present

## 2019-09-01 DIAGNOSIS — N186 End stage renal disease: Secondary | ICD-10-CM | POA: Diagnosis not present

## 2019-09-01 DIAGNOSIS — E8770 Fluid overload, unspecified: Secondary | ICD-10-CM | POA: Diagnosis not present

## 2019-09-01 DIAGNOSIS — Z992 Dependence on renal dialysis: Secondary | ICD-10-CM | POA: Diagnosis not present

## 2019-09-01 DIAGNOSIS — N2581 Secondary hyperparathyroidism of renal origin: Secondary | ICD-10-CM | POA: Diagnosis not present

## 2019-09-03 DIAGNOSIS — N186 End stage renal disease: Secondary | ICD-10-CM | POA: Diagnosis not present

## 2019-09-03 DIAGNOSIS — E8770 Fluid overload, unspecified: Secondary | ICD-10-CM | POA: Diagnosis not present

## 2019-09-03 DIAGNOSIS — N2581 Secondary hyperparathyroidism of renal origin: Secondary | ICD-10-CM | POA: Diagnosis not present

## 2019-09-03 DIAGNOSIS — Z992 Dependence on renal dialysis: Secondary | ICD-10-CM | POA: Diagnosis not present

## 2019-09-05 DIAGNOSIS — Z992 Dependence on renal dialysis: Secondary | ICD-10-CM | POA: Diagnosis not present

## 2019-09-05 DIAGNOSIS — N186 End stage renal disease: Secondary | ICD-10-CM | POA: Diagnosis not present

## 2019-09-06 DIAGNOSIS — N2581 Secondary hyperparathyroidism of renal origin: Secondary | ICD-10-CM | POA: Diagnosis not present

## 2019-09-06 DIAGNOSIS — Z992 Dependence on renal dialysis: Secondary | ICD-10-CM | POA: Diagnosis not present

## 2019-09-06 DIAGNOSIS — E8770 Fluid overload, unspecified: Secondary | ICD-10-CM | POA: Diagnosis not present

## 2019-09-06 DIAGNOSIS — E8779 Other fluid overload: Secondary | ICD-10-CM | POA: Diagnosis not present

## 2019-09-06 DIAGNOSIS — N186 End stage renal disease: Secondary | ICD-10-CM | POA: Diagnosis not present

## 2019-09-08 DIAGNOSIS — N186 End stage renal disease: Secondary | ICD-10-CM | POA: Diagnosis not present

## 2019-09-08 DIAGNOSIS — Z992 Dependence on renal dialysis: Secondary | ICD-10-CM | POA: Diagnosis not present

## 2019-09-08 DIAGNOSIS — E8779 Other fluid overload: Secondary | ICD-10-CM | POA: Diagnosis not present

## 2019-09-08 DIAGNOSIS — E8770 Fluid overload, unspecified: Secondary | ICD-10-CM | POA: Diagnosis not present

## 2019-09-08 DIAGNOSIS — N2581 Secondary hyperparathyroidism of renal origin: Secondary | ICD-10-CM | POA: Diagnosis not present

## 2019-09-09 DIAGNOSIS — E8779 Other fluid overload: Secondary | ICD-10-CM | POA: Diagnosis not present

## 2019-09-09 DIAGNOSIS — E8770 Fluid overload, unspecified: Secondary | ICD-10-CM | POA: Diagnosis not present

## 2019-09-09 DIAGNOSIS — Z992 Dependence on renal dialysis: Secondary | ICD-10-CM | POA: Diagnosis not present

## 2019-09-09 DIAGNOSIS — N186 End stage renal disease: Secondary | ICD-10-CM | POA: Diagnosis not present

## 2019-09-09 DIAGNOSIS — N2581 Secondary hyperparathyroidism of renal origin: Secondary | ICD-10-CM | POA: Diagnosis not present

## 2019-09-10 DIAGNOSIS — N186 End stage renal disease: Secondary | ICD-10-CM | POA: Diagnosis not present

## 2019-09-10 DIAGNOSIS — N2581 Secondary hyperparathyroidism of renal origin: Secondary | ICD-10-CM | POA: Diagnosis not present

## 2019-09-10 DIAGNOSIS — E8779 Other fluid overload: Secondary | ICD-10-CM | POA: Diagnosis not present

## 2019-09-10 DIAGNOSIS — Z992 Dependence on renal dialysis: Secondary | ICD-10-CM | POA: Diagnosis not present

## 2019-09-10 DIAGNOSIS — E8770 Fluid overload, unspecified: Secondary | ICD-10-CM | POA: Diagnosis not present

## 2019-09-13 ENCOUNTER — Telehealth: Payer: Self-pay

## 2019-09-13 DIAGNOSIS — Z992 Dependence on renal dialysis: Secondary | ICD-10-CM | POA: Diagnosis not present

## 2019-09-13 DIAGNOSIS — N2581 Secondary hyperparathyroidism of renal origin: Secondary | ICD-10-CM | POA: Diagnosis not present

## 2019-09-13 DIAGNOSIS — E8770 Fluid overload, unspecified: Secondary | ICD-10-CM | POA: Diagnosis not present

## 2019-09-13 DIAGNOSIS — E8779 Other fluid overload: Secondary | ICD-10-CM | POA: Diagnosis not present

## 2019-09-13 DIAGNOSIS — N186 End stage renal disease: Secondary | ICD-10-CM | POA: Diagnosis not present

## 2019-09-13 NOTE — Telephone Encounter (Signed)
Joy with [facility] called to let us know pt is unwilling to have fistulogram on 6/14 as she is moving to [place] on that day.  Pt is requesting an arrival time of 11am but has been explained to that this is often difficult, as procedures start early in the morning. Joy will call back in the next couple of weeks to r/s her fistulogram for pt.

## 2019-09-15 DIAGNOSIS — E8779 Other fluid overload: Secondary | ICD-10-CM | POA: Diagnosis not present

## 2019-09-15 DIAGNOSIS — N186 End stage renal disease: Secondary | ICD-10-CM | POA: Diagnosis not present

## 2019-09-15 DIAGNOSIS — E8770 Fluid overload, unspecified: Secondary | ICD-10-CM | POA: Diagnosis not present

## 2019-09-15 DIAGNOSIS — N2581 Secondary hyperparathyroidism of renal origin: Secondary | ICD-10-CM | POA: Diagnosis not present

## 2019-09-15 DIAGNOSIS — Z992 Dependence on renal dialysis: Secondary | ICD-10-CM | POA: Diagnosis not present

## 2019-09-17 DIAGNOSIS — E8770 Fluid overload, unspecified: Secondary | ICD-10-CM | POA: Diagnosis not present

## 2019-09-17 DIAGNOSIS — N2581 Secondary hyperparathyroidism of renal origin: Secondary | ICD-10-CM | POA: Diagnosis not present

## 2019-09-17 DIAGNOSIS — Z992 Dependence on renal dialysis: Secondary | ICD-10-CM | POA: Diagnosis not present

## 2019-09-17 DIAGNOSIS — N186 End stage renal disease: Secondary | ICD-10-CM | POA: Diagnosis not present

## 2019-09-17 DIAGNOSIS — E8779 Other fluid overload: Secondary | ICD-10-CM | POA: Diagnosis not present

## 2019-09-20 DIAGNOSIS — E8770 Fluid overload, unspecified: Secondary | ICD-10-CM | POA: Diagnosis not present

## 2019-09-20 DIAGNOSIS — E8779 Other fluid overload: Secondary | ICD-10-CM | POA: Diagnosis not present

## 2019-09-20 DIAGNOSIS — N2581 Secondary hyperparathyroidism of renal origin: Secondary | ICD-10-CM | POA: Diagnosis not present

## 2019-09-20 DIAGNOSIS — N186 End stage renal disease: Secondary | ICD-10-CM | POA: Diagnosis not present

## 2019-09-20 DIAGNOSIS — Z992 Dependence on renal dialysis: Secondary | ICD-10-CM | POA: Diagnosis not present

## 2019-09-22 DIAGNOSIS — Z992 Dependence on renal dialysis: Secondary | ICD-10-CM | POA: Diagnosis not present

## 2019-09-22 DIAGNOSIS — N186 End stage renal disease: Secondary | ICD-10-CM | POA: Diagnosis not present

## 2019-09-22 DIAGNOSIS — E8770 Fluid overload, unspecified: Secondary | ICD-10-CM | POA: Diagnosis not present

## 2019-09-22 DIAGNOSIS — N2581 Secondary hyperparathyroidism of renal origin: Secondary | ICD-10-CM | POA: Diagnosis not present

## 2019-09-22 DIAGNOSIS — E8779 Other fluid overload: Secondary | ICD-10-CM | POA: Diagnosis not present

## 2019-09-24 DIAGNOSIS — E8770 Fluid overload, unspecified: Secondary | ICD-10-CM | POA: Diagnosis not present

## 2019-09-24 DIAGNOSIS — E8779 Other fluid overload: Secondary | ICD-10-CM | POA: Diagnosis not present

## 2019-09-24 DIAGNOSIS — Z992 Dependence on renal dialysis: Secondary | ICD-10-CM | POA: Diagnosis not present

## 2019-09-24 DIAGNOSIS — N186 End stage renal disease: Secondary | ICD-10-CM | POA: Diagnosis not present

## 2019-09-24 DIAGNOSIS — N2581 Secondary hyperparathyroidism of renal origin: Secondary | ICD-10-CM | POA: Diagnosis not present

## 2019-09-27 DIAGNOSIS — Z992 Dependence on renal dialysis: Secondary | ICD-10-CM | POA: Diagnosis not present

## 2019-09-27 DIAGNOSIS — E8770 Fluid overload, unspecified: Secondary | ICD-10-CM | POA: Diagnosis not present

## 2019-09-27 DIAGNOSIS — N186 End stage renal disease: Secondary | ICD-10-CM | POA: Diagnosis not present

## 2019-09-27 DIAGNOSIS — N2581 Secondary hyperparathyroidism of renal origin: Secondary | ICD-10-CM | POA: Diagnosis not present

## 2019-09-27 DIAGNOSIS — E8779 Other fluid overload: Secondary | ICD-10-CM | POA: Diagnosis not present

## 2019-09-28 DIAGNOSIS — Z992 Dependence on renal dialysis: Secondary | ICD-10-CM | POA: Diagnosis not present

## 2019-09-28 DIAGNOSIS — E8770 Fluid overload, unspecified: Secondary | ICD-10-CM | POA: Diagnosis not present

## 2019-09-28 DIAGNOSIS — E8779 Other fluid overload: Secondary | ICD-10-CM | POA: Diagnosis not present

## 2019-09-28 DIAGNOSIS — N186 End stage renal disease: Secondary | ICD-10-CM | POA: Diagnosis not present

## 2019-09-28 DIAGNOSIS — N2581 Secondary hyperparathyroidism of renal origin: Secondary | ICD-10-CM | POA: Diagnosis not present

## 2019-09-29 DIAGNOSIS — E8770 Fluid overload, unspecified: Secondary | ICD-10-CM | POA: Diagnosis not present

## 2019-09-29 DIAGNOSIS — E8779 Other fluid overload: Secondary | ICD-10-CM | POA: Diagnosis not present

## 2019-09-29 DIAGNOSIS — N186 End stage renal disease: Secondary | ICD-10-CM | POA: Diagnosis not present

## 2019-09-29 DIAGNOSIS — N2581 Secondary hyperparathyroidism of renal origin: Secondary | ICD-10-CM | POA: Diagnosis not present

## 2019-09-29 DIAGNOSIS — Z992 Dependence on renal dialysis: Secondary | ICD-10-CM | POA: Diagnosis not present

## 2019-10-01 DIAGNOSIS — Z992 Dependence on renal dialysis: Secondary | ICD-10-CM | POA: Diagnosis not present

## 2019-10-01 DIAGNOSIS — E8779 Other fluid overload: Secondary | ICD-10-CM | POA: Diagnosis not present

## 2019-10-01 DIAGNOSIS — N2581 Secondary hyperparathyroidism of renal origin: Secondary | ICD-10-CM | POA: Diagnosis not present

## 2019-10-01 DIAGNOSIS — N186 End stage renal disease: Secondary | ICD-10-CM | POA: Diagnosis not present

## 2019-10-01 DIAGNOSIS — E8770 Fluid overload, unspecified: Secondary | ICD-10-CM | POA: Diagnosis not present

## 2019-10-02 DIAGNOSIS — R0902 Hypoxemia: Secondary | ICD-10-CM | POA: Diagnosis not present

## 2019-10-02 DIAGNOSIS — D649 Anemia, unspecified: Secondary | ICD-10-CM | POA: Diagnosis not present

## 2019-10-02 DIAGNOSIS — R918 Other nonspecific abnormal finding of lung field: Secondary | ICD-10-CM | POA: Diagnosis not present

## 2019-10-02 DIAGNOSIS — Z992 Dependence on renal dialysis: Secondary | ICD-10-CM | POA: Diagnosis not present

## 2019-10-02 DIAGNOSIS — J9811 Atelectasis: Secondary | ICD-10-CM | POA: Diagnosis not present

## 2019-10-02 DIAGNOSIS — I120 Hypertensive chronic kidney disease with stage 5 chronic kidney disease or end stage renal disease: Secondary | ICD-10-CM | POA: Diagnosis not present

## 2019-10-02 DIAGNOSIS — R112 Nausea with vomiting, unspecified: Secondary | ICD-10-CM | POA: Diagnosis not present

## 2019-10-02 DIAGNOSIS — R748 Abnormal levels of other serum enzymes: Secondary | ICD-10-CM | POA: Diagnosis not present

## 2019-10-02 DIAGNOSIS — E785 Hyperlipidemia, unspecified: Secondary | ICD-10-CM | POA: Diagnosis not present

## 2019-10-02 DIAGNOSIS — M1990 Unspecified osteoarthritis, unspecified site: Secondary | ICD-10-CM | POA: Diagnosis not present

## 2019-10-02 DIAGNOSIS — N186 End stage renal disease: Secondary | ICD-10-CM | POA: Diagnosis not present

## 2019-10-02 DIAGNOSIS — E1122 Type 2 diabetes mellitus with diabetic chronic kidney disease: Secondary | ICD-10-CM | POA: Diagnosis not present

## 2019-10-02 DIAGNOSIS — R110 Nausea: Secondary | ICD-10-CM | POA: Diagnosis not present

## 2019-10-02 DIAGNOSIS — I10 Essential (primary) hypertension: Secondary | ICD-10-CM | POA: Diagnosis not present

## 2019-10-02 DIAGNOSIS — I517 Cardiomegaly: Secondary | ICD-10-CM | POA: Diagnosis not present

## 2019-10-02 DIAGNOSIS — E875 Hyperkalemia: Secondary | ICD-10-CM | POA: Diagnosis not present

## 2019-10-02 DIAGNOSIS — Z8673 Personal history of transient ischemic attack (TIA), and cerebral infarction without residual deficits: Secondary | ICD-10-CM | POA: Diagnosis not present

## 2019-10-02 DIAGNOSIS — R52 Pain, unspecified: Secondary | ICD-10-CM | POA: Diagnosis not present

## 2019-10-02 DIAGNOSIS — R600 Localized edema: Secondary | ICD-10-CM | POA: Diagnosis not present

## 2019-10-04 ENCOUNTER — Other Ambulatory Visit: Payer: Self-pay

## 2019-10-04 DIAGNOSIS — N2581 Secondary hyperparathyroidism of renal origin: Secondary | ICD-10-CM | POA: Diagnosis not present

## 2019-10-04 DIAGNOSIS — N186 End stage renal disease: Secondary | ICD-10-CM | POA: Diagnosis not present

## 2019-10-04 DIAGNOSIS — D509 Iron deficiency anemia, unspecified: Secondary | ICD-10-CM | POA: Diagnosis not present

## 2019-10-04 DIAGNOSIS — E8779 Other fluid overload: Secondary | ICD-10-CM | POA: Diagnosis not present

## 2019-10-04 DIAGNOSIS — Z992 Dependence on renal dialysis: Secondary | ICD-10-CM | POA: Diagnosis not present

## 2019-10-04 DIAGNOSIS — E8770 Fluid overload, unspecified: Secondary | ICD-10-CM | POA: Diagnosis not present

## 2019-10-04 DIAGNOSIS — E119 Type 2 diabetes mellitus without complications: Secondary | ICD-10-CM | POA: Diagnosis not present

## 2019-10-05 DIAGNOSIS — I10 Essential (primary) hypertension: Secondary | ICD-10-CM | POA: Diagnosis not present

## 2019-10-05 DIAGNOSIS — Z992 Dependence on renal dialysis: Secondary | ICD-10-CM | POA: Diagnosis not present

## 2019-10-05 DIAGNOSIS — E785 Hyperlipidemia, unspecified: Secondary | ICD-10-CM | POA: Diagnosis not present

## 2019-10-05 DIAGNOSIS — N186 End stage renal disease: Secondary | ICD-10-CM | POA: Diagnosis not present

## 2019-10-06 DIAGNOSIS — E8779 Other fluid overload: Secondary | ICD-10-CM | POA: Diagnosis not present

## 2019-10-06 DIAGNOSIS — N186 End stage renal disease: Secondary | ICD-10-CM | POA: Diagnosis not present

## 2019-10-06 DIAGNOSIS — N2581 Secondary hyperparathyroidism of renal origin: Secondary | ICD-10-CM | POA: Diagnosis not present

## 2019-10-06 DIAGNOSIS — Z992 Dependence on renal dialysis: Secondary | ICD-10-CM | POA: Diagnosis not present

## 2019-10-07 DIAGNOSIS — E8779 Other fluid overload: Secondary | ICD-10-CM | POA: Diagnosis not present

## 2019-10-07 DIAGNOSIS — N2581 Secondary hyperparathyroidism of renal origin: Secondary | ICD-10-CM | POA: Diagnosis not present

## 2019-10-07 DIAGNOSIS — N186 End stage renal disease: Secondary | ICD-10-CM | POA: Diagnosis not present

## 2019-10-07 DIAGNOSIS — Z992 Dependence on renal dialysis: Secondary | ICD-10-CM | POA: Diagnosis not present

## 2019-10-08 DIAGNOSIS — Z992 Dependence on renal dialysis: Secondary | ICD-10-CM | POA: Diagnosis not present

## 2019-10-08 DIAGNOSIS — E8779 Other fluid overload: Secondary | ICD-10-CM | POA: Diagnosis not present

## 2019-10-08 DIAGNOSIS — N186 End stage renal disease: Secondary | ICD-10-CM | POA: Diagnosis not present

## 2019-10-08 DIAGNOSIS — N2581 Secondary hyperparathyroidism of renal origin: Secondary | ICD-10-CM | POA: Diagnosis not present

## 2019-10-11 DIAGNOSIS — N186 End stage renal disease: Secondary | ICD-10-CM | POA: Diagnosis not present

## 2019-10-11 DIAGNOSIS — Z992 Dependence on renal dialysis: Secondary | ICD-10-CM | POA: Diagnosis not present

## 2019-10-11 DIAGNOSIS — E8779 Other fluid overload: Secondary | ICD-10-CM | POA: Diagnosis not present

## 2019-10-11 DIAGNOSIS — N2581 Secondary hyperparathyroidism of renal origin: Secondary | ICD-10-CM | POA: Diagnosis not present

## 2019-10-13 DIAGNOSIS — N2581 Secondary hyperparathyroidism of renal origin: Secondary | ICD-10-CM | POA: Diagnosis not present

## 2019-10-13 DIAGNOSIS — N186 End stage renal disease: Secondary | ICD-10-CM | POA: Diagnosis not present

## 2019-10-13 DIAGNOSIS — E8779 Other fluid overload: Secondary | ICD-10-CM | POA: Diagnosis not present

## 2019-10-13 DIAGNOSIS — Z992 Dependence on renal dialysis: Secondary | ICD-10-CM | POA: Diagnosis not present

## 2019-10-14 ENCOUNTER — Inpatient Hospital Stay (HOSPITAL_COMMUNITY): Admission: RE | Admit: 2019-10-14 | Payer: Medicaid Other | Source: Ambulatory Visit

## 2019-10-15 ENCOUNTER — Other Ambulatory Visit (HOSPITAL_COMMUNITY): Payer: Medicaid Other

## 2019-10-15 DIAGNOSIS — N186 End stage renal disease: Secondary | ICD-10-CM | POA: Diagnosis not present

## 2019-10-15 DIAGNOSIS — Z992 Dependence on renal dialysis: Secondary | ICD-10-CM | POA: Diagnosis not present

## 2019-10-15 DIAGNOSIS — E8779 Other fluid overload: Secondary | ICD-10-CM | POA: Diagnosis not present

## 2019-10-15 DIAGNOSIS — N2581 Secondary hyperparathyroidism of renal origin: Secondary | ICD-10-CM | POA: Diagnosis not present

## 2019-10-17 ENCOUNTER — Encounter (HOSPITAL_COMMUNITY): Admission: RE | Payer: Self-pay | Source: Home / Self Care

## 2019-10-17 ENCOUNTER — Ambulatory Visit (HOSPITAL_COMMUNITY): Admission: RE | Admit: 2019-10-17 | Payer: Medicare Other | Source: Home / Self Care | Admitting: Vascular Surgery

## 2019-10-17 SURGERY — Surgical Case
Anesthesia: *Unknown

## 2019-10-17 SURGERY — A/V FISTULAGRAM
Anesthesia: LOCAL | Laterality: Right

## 2019-10-18 DIAGNOSIS — N2581 Secondary hyperparathyroidism of renal origin: Secondary | ICD-10-CM | POA: Diagnosis not present

## 2019-10-18 DIAGNOSIS — Z992 Dependence on renal dialysis: Secondary | ICD-10-CM | POA: Diagnosis not present

## 2019-10-18 DIAGNOSIS — N186 End stage renal disease: Secondary | ICD-10-CM | POA: Diagnosis not present

## 2019-10-18 DIAGNOSIS — E8779 Other fluid overload: Secondary | ICD-10-CM | POA: Diagnosis not present

## 2019-10-20 DIAGNOSIS — N186 End stage renal disease: Secondary | ICD-10-CM | POA: Diagnosis not present

## 2019-10-20 DIAGNOSIS — N2581 Secondary hyperparathyroidism of renal origin: Secondary | ICD-10-CM | POA: Diagnosis not present

## 2019-10-20 DIAGNOSIS — Z992 Dependence on renal dialysis: Secondary | ICD-10-CM | POA: Diagnosis not present

## 2019-10-20 DIAGNOSIS — E8779 Other fluid overload: Secondary | ICD-10-CM | POA: Diagnosis not present

## 2019-10-22 DIAGNOSIS — N2581 Secondary hyperparathyroidism of renal origin: Secondary | ICD-10-CM | POA: Diagnosis not present

## 2019-10-22 DIAGNOSIS — Z992 Dependence on renal dialysis: Secondary | ICD-10-CM | POA: Diagnosis not present

## 2019-10-22 DIAGNOSIS — N186 End stage renal disease: Secondary | ICD-10-CM | POA: Diagnosis not present

## 2019-10-22 DIAGNOSIS — E8779 Other fluid overload: Secondary | ICD-10-CM | POA: Diagnosis not present

## 2019-10-25 DIAGNOSIS — N2581 Secondary hyperparathyroidism of renal origin: Secondary | ICD-10-CM | POA: Diagnosis not present

## 2019-10-25 DIAGNOSIS — E8779 Other fluid overload: Secondary | ICD-10-CM | POA: Diagnosis not present

## 2019-10-25 DIAGNOSIS — Z992 Dependence on renal dialysis: Secondary | ICD-10-CM | POA: Diagnosis not present

## 2019-10-25 DIAGNOSIS — N186 End stage renal disease: Secondary | ICD-10-CM | POA: Diagnosis not present

## 2019-10-27 DIAGNOSIS — E8779 Other fluid overload: Secondary | ICD-10-CM | POA: Diagnosis not present

## 2019-10-27 DIAGNOSIS — N186 End stage renal disease: Secondary | ICD-10-CM | POA: Diagnosis not present

## 2019-10-27 DIAGNOSIS — Z992 Dependence on renal dialysis: Secondary | ICD-10-CM | POA: Diagnosis not present

## 2019-10-27 DIAGNOSIS — N2581 Secondary hyperparathyroidism of renal origin: Secondary | ICD-10-CM | POA: Diagnosis not present

## 2019-10-29 DIAGNOSIS — E8779 Other fluid overload: Secondary | ICD-10-CM | POA: Diagnosis not present

## 2019-10-29 DIAGNOSIS — Z992 Dependence on renal dialysis: Secondary | ICD-10-CM | POA: Diagnosis not present

## 2019-10-29 DIAGNOSIS — N186 End stage renal disease: Secondary | ICD-10-CM | POA: Diagnosis not present

## 2019-10-29 DIAGNOSIS — N2581 Secondary hyperparathyroidism of renal origin: Secondary | ICD-10-CM | POA: Diagnosis not present

## 2019-11-01 DIAGNOSIS — E8779 Other fluid overload: Secondary | ICD-10-CM | POA: Diagnosis not present

## 2019-11-01 DIAGNOSIS — Z992 Dependence on renal dialysis: Secondary | ICD-10-CM | POA: Diagnosis not present

## 2019-11-01 DIAGNOSIS — N2581 Secondary hyperparathyroidism of renal origin: Secondary | ICD-10-CM | POA: Diagnosis not present

## 2019-11-01 DIAGNOSIS — N186 End stage renal disease: Secondary | ICD-10-CM | POA: Diagnosis not present

## 2019-11-03 ENCOUNTER — Other Ambulatory Visit: Payer: Self-pay

## 2019-11-03 DIAGNOSIS — N186 End stage renal disease: Secondary | ICD-10-CM | POA: Diagnosis not present

## 2019-11-03 DIAGNOSIS — E8779 Other fluid overload: Secondary | ICD-10-CM | POA: Diagnosis not present

## 2019-11-03 DIAGNOSIS — Z992 Dependence on renal dialysis: Secondary | ICD-10-CM | POA: Diagnosis not present

## 2019-11-03 DIAGNOSIS — N2581 Secondary hyperparathyroidism of renal origin: Secondary | ICD-10-CM | POA: Diagnosis not present

## 2019-11-03 MED ORDER — SODIUM CHLORIDE 0.9 % IV SOLN
250.0000 mL | INTRAVENOUS | Status: DC | PRN
Start: 2019-11-03 — End: 2019-11-29

## 2019-11-05 DIAGNOSIS — N2581 Secondary hyperparathyroidism of renal origin: Secondary | ICD-10-CM | POA: Diagnosis not present

## 2019-11-05 DIAGNOSIS — Z992 Dependence on renal dialysis: Secondary | ICD-10-CM | POA: Diagnosis not present

## 2019-11-05 DIAGNOSIS — N186 End stage renal disease: Secondary | ICD-10-CM | POA: Diagnosis not present

## 2019-11-05 DIAGNOSIS — E8779 Other fluid overload: Secondary | ICD-10-CM | POA: Diagnosis not present

## 2019-11-08 DIAGNOSIS — Z23 Encounter for immunization: Secondary | ICD-10-CM | POA: Diagnosis not present

## 2019-11-08 DIAGNOSIS — Z992 Dependence on renal dialysis: Secondary | ICD-10-CM | POA: Diagnosis not present

## 2019-11-08 DIAGNOSIS — N2581 Secondary hyperparathyroidism of renal origin: Secondary | ICD-10-CM | POA: Diagnosis not present

## 2019-11-08 DIAGNOSIS — N186 End stage renal disease: Secondary | ICD-10-CM | POA: Diagnosis not present

## 2019-11-10 DIAGNOSIS — N186 End stage renal disease: Secondary | ICD-10-CM | POA: Diagnosis not present

## 2019-11-10 DIAGNOSIS — N2581 Secondary hyperparathyroidism of renal origin: Secondary | ICD-10-CM | POA: Diagnosis not present

## 2019-11-10 DIAGNOSIS — Z23 Encounter for immunization: Secondary | ICD-10-CM | POA: Diagnosis not present

## 2019-11-10 DIAGNOSIS — Z992 Dependence on renal dialysis: Secondary | ICD-10-CM | POA: Diagnosis not present

## 2019-11-12 DIAGNOSIS — Z992 Dependence on renal dialysis: Secondary | ICD-10-CM | POA: Diagnosis not present

## 2019-11-12 DIAGNOSIS — N2581 Secondary hyperparathyroidism of renal origin: Secondary | ICD-10-CM | POA: Diagnosis not present

## 2019-11-12 DIAGNOSIS — Z23 Encounter for immunization: Secondary | ICD-10-CM | POA: Diagnosis not present

## 2019-11-12 DIAGNOSIS — N186 End stage renal disease: Secondary | ICD-10-CM | POA: Diagnosis not present

## 2019-11-15 DIAGNOSIS — Z23 Encounter for immunization: Secondary | ICD-10-CM | POA: Diagnosis not present

## 2019-11-15 DIAGNOSIS — Z992 Dependence on renal dialysis: Secondary | ICD-10-CM | POA: Diagnosis not present

## 2019-11-15 DIAGNOSIS — N2581 Secondary hyperparathyroidism of renal origin: Secondary | ICD-10-CM | POA: Diagnosis not present

## 2019-11-15 DIAGNOSIS — N186 End stage renal disease: Secondary | ICD-10-CM | POA: Diagnosis not present

## 2019-11-17 DIAGNOSIS — N186 End stage renal disease: Secondary | ICD-10-CM | POA: Diagnosis not present

## 2019-11-17 DIAGNOSIS — Z992 Dependence on renal dialysis: Secondary | ICD-10-CM | POA: Diagnosis not present

## 2019-11-17 DIAGNOSIS — N2581 Secondary hyperparathyroidism of renal origin: Secondary | ICD-10-CM | POA: Diagnosis not present

## 2019-11-17 DIAGNOSIS — Z23 Encounter for immunization: Secondary | ICD-10-CM | POA: Diagnosis not present

## 2019-11-19 DIAGNOSIS — N2581 Secondary hyperparathyroidism of renal origin: Secondary | ICD-10-CM | POA: Diagnosis not present

## 2019-11-19 DIAGNOSIS — Z23 Encounter for immunization: Secondary | ICD-10-CM | POA: Diagnosis not present

## 2019-11-19 DIAGNOSIS — N186 End stage renal disease: Secondary | ICD-10-CM | POA: Diagnosis not present

## 2019-11-19 DIAGNOSIS — Z992 Dependence on renal dialysis: Secondary | ICD-10-CM | POA: Diagnosis not present

## 2019-11-21 DIAGNOSIS — N186 End stage renal disease: Secondary | ICD-10-CM | POA: Diagnosis not present

## 2019-11-21 DIAGNOSIS — J329 Chronic sinusitis, unspecified: Secondary | ICD-10-CM | POA: Diagnosis not present

## 2019-11-21 DIAGNOSIS — Z299 Encounter for prophylactic measures, unspecified: Secondary | ICD-10-CM | POA: Diagnosis not present

## 2019-11-21 DIAGNOSIS — I10 Essential (primary) hypertension: Secondary | ICD-10-CM | POA: Diagnosis not present

## 2019-11-21 DIAGNOSIS — Z789 Other specified health status: Secondary | ICD-10-CM | POA: Diagnosis not present

## 2019-11-21 DIAGNOSIS — Z992 Dependence on renal dialysis: Secondary | ICD-10-CM | POA: Diagnosis not present

## 2019-11-22 DIAGNOSIS — Z992 Dependence on renal dialysis: Secondary | ICD-10-CM | POA: Diagnosis not present

## 2019-11-22 DIAGNOSIS — N186 End stage renal disease: Secondary | ICD-10-CM | POA: Diagnosis not present

## 2019-11-22 DIAGNOSIS — Z23 Encounter for immunization: Secondary | ICD-10-CM | POA: Diagnosis not present

## 2019-11-22 DIAGNOSIS — N2581 Secondary hyperparathyroidism of renal origin: Secondary | ICD-10-CM | POA: Diagnosis not present

## 2019-11-24 DIAGNOSIS — N186 End stage renal disease: Secondary | ICD-10-CM | POA: Diagnosis not present

## 2019-11-24 DIAGNOSIS — Z23 Encounter for immunization: Secondary | ICD-10-CM | POA: Diagnosis not present

## 2019-11-24 DIAGNOSIS — N2581 Secondary hyperparathyroidism of renal origin: Secondary | ICD-10-CM | POA: Diagnosis not present

## 2019-11-24 DIAGNOSIS — Z992 Dependence on renal dialysis: Secondary | ICD-10-CM | POA: Diagnosis not present

## 2019-11-26 DIAGNOSIS — Z992 Dependence on renal dialysis: Secondary | ICD-10-CM | POA: Diagnosis not present

## 2019-11-26 DIAGNOSIS — N186 End stage renal disease: Secondary | ICD-10-CM | POA: Diagnosis not present

## 2019-11-26 DIAGNOSIS — Z23 Encounter for immunization: Secondary | ICD-10-CM | POA: Diagnosis not present

## 2019-11-26 DIAGNOSIS — N2581 Secondary hyperparathyroidism of renal origin: Secondary | ICD-10-CM | POA: Diagnosis not present

## 2019-11-28 ENCOUNTER — Inpatient Hospital Stay (HOSPITAL_COMMUNITY): Payer: Medicare Other

## 2019-11-28 ENCOUNTER — Observation Stay (HOSPITAL_COMMUNITY)
Admission: AD | Admit: 2019-11-28 | Discharge: 2019-11-29 | Disposition: A | Discharge status: Home / Self Care | Payer: Medicare Other | Attending: Family Medicine | Admitting: Family Medicine

## 2019-11-28 ENCOUNTER — Encounter (HOSPITAL_COMMUNITY)
Admission: AD | Disposition: A | Discharge status: Home / Self Care | Payer: Self-pay | Source: Home / Self Care | Attending: Internal Medicine

## 2019-11-28 DIAGNOSIS — D638 Anemia in other chronic diseases classified elsewhere: Secondary | ICD-10-CM | POA: Diagnosis not present

## 2019-11-28 DIAGNOSIS — I160 Hypertensive urgency: Secondary | ICD-10-CM | POA: Diagnosis present

## 2019-11-28 DIAGNOSIS — J9601 Acute respiratory failure with hypoxia: Principal | ICD-10-CM | POA: Insufficient documentation

## 2019-11-28 DIAGNOSIS — E1122 Type 2 diabetes mellitus with diabetic chronic kidney disease: Secondary | ICD-10-CM | POA: Insufficient documentation

## 2019-11-28 DIAGNOSIS — D631 Anemia in chronic kidney disease: Secondary | ICD-10-CM | POA: Diagnosis present

## 2019-11-28 DIAGNOSIS — Z79899 Other long term (current) drug therapy: Secondary | ICD-10-CM | POA: Diagnosis not present

## 2019-11-28 DIAGNOSIS — I120 Hypertensive chronic kidney disease with stage 5 chronic kidney disease or end stage renal disease: Secondary | ICD-10-CM | POA: Insufficient documentation

## 2019-11-28 DIAGNOSIS — E8770 Fluid overload, unspecified: Secondary | ICD-10-CM | POA: Diagnosis not present

## 2019-11-28 DIAGNOSIS — Z20822 Contact with and (suspected) exposure to covid-19: Secondary | ICD-10-CM | POA: Diagnosis not present

## 2019-11-28 DIAGNOSIS — IMO0002 Reserved for concepts with insufficient information to code with codable children: Secondary | ICD-10-CM

## 2019-11-28 DIAGNOSIS — Z794 Long term (current) use of insulin: Secondary | ICD-10-CM

## 2019-11-28 DIAGNOSIS — J9621 Acute and chronic respiratory failure with hypoxia: Secondary | ICD-10-CM | POA: Diagnosis present

## 2019-11-28 DIAGNOSIS — N186 End stage renal disease: Secondary | ICD-10-CM | POA: Insufficient documentation

## 2019-11-28 DIAGNOSIS — Z992 Dependence on renal dialysis: Secondary | ICD-10-CM | POA: Diagnosis not present

## 2019-11-28 DIAGNOSIS — E1129 Type 2 diabetes mellitus with other diabetic kidney complication: Secondary | ICD-10-CM | POA: Diagnosis not present

## 2019-11-28 DIAGNOSIS — R0602 Shortness of breath: Secondary | ICD-10-CM

## 2019-11-28 DIAGNOSIS — R0902 Hypoxemia: Secondary | ICD-10-CM | POA: Diagnosis not present

## 2019-11-28 DIAGNOSIS — I517 Cardiomegaly: Secondary | ICD-10-CM | POA: Diagnosis not present

## 2019-11-28 DIAGNOSIS — Z7984 Long term (current) use of oral hypoglycemic drugs: Secondary | ICD-10-CM | POA: Diagnosis not present

## 2019-11-28 DIAGNOSIS — N189 Chronic kidney disease, unspecified: Secondary | ICD-10-CM | POA: Diagnosis present

## 2019-11-28 DIAGNOSIS — E1169 Type 2 diabetes mellitus with other specified complication: Secondary | ICD-10-CM

## 2019-11-28 DIAGNOSIS — E11649 Type 2 diabetes mellitus with hypoglycemia without coma: Secondary | ICD-10-CM | POA: Diagnosis present

## 2019-11-28 LAB — CBC WITH DIFFERENTIAL/PLATELET
Abs Immature Granulocytes: 0.08 10*3/uL — ABNORMAL HIGH (ref 0.00–0.07)
Basophils Absolute: 0.1 10*3/uL (ref 0.0–0.1)
Basophils Relative: 0 %
Eosinophils Absolute: 0.4 10*3/uL (ref 0.0–0.5)
Eosinophils Relative: 3 %
HCT: 28.8 % — ABNORMAL LOW (ref 36.0–46.0)
Hemoglobin: 8.8 g/dL — ABNORMAL LOW (ref 12.0–15.0)
Immature Granulocytes: 1 %
Lymphocytes Relative: 9 %
Lymphs Abs: 1.2 10*3/uL (ref 0.7–4.0)
MCH: 29.5 pg (ref 26.0–34.0)
MCHC: 30.6 g/dL (ref 30.0–36.0)
MCV: 96.6 fL (ref 80.0–100.0)
Monocytes Absolute: 0.7 10*3/uL (ref 0.1–1.0)
Monocytes Relative: 6 %
Neutro Abs: 10.9 10*3/uL — ABNORMAL HIGH (ref 1.7–7.7)
Neutrophils Relative %: 81 %
Platelets: 237 10*3/uL (ref 150–400)
RBC: 2.98 MIL/uL — ABNORMAL LOW (ref 3.87–5.11)
RDW: 14.3 % (ref 11.5–15.5)
WBC: 13.4 10*3/uL — ABNORMAL HIGH (ref 4.0–10.5)
nRBC: 0 % (ref 0.0–0.2)

## 2019-11-28 LAB — BRAIN NATRIURETIC PEPTIDE: B Natriuretic Peptide: 629.7 pg/mL — ABNORMAL HIGH (ref 0.0–100.0)

## 2019-11-28 LAB — HEPATITIS B CORE ANTIBODY, TOTAL: Hep B Core Total Ab: NONREACTIVE

## 2019-11-28 LAB — POCT I-STAT, CHEM 8
BUN: 54 mg/dL — ABNORMAL HIGH (ref 6–20)
Calcium, Ion: 1.02 mmol/L — ABNORMAL LOW (ref 1.15–1.40)
Chloride: 100 mmol/L (ref 98–111)
Creatinine, Ser: 12.1 mg/dL — ABNORMAL HIGH (ref 0.44–1.00)
Glucose, Bld: 162 mg/dL — ABNORMAL HIGH (ref 70–99)
HCT: 28 % — ABNORMAL LOW (ref 36.0–46.0)
Hemoglobin: 9.5 g/dL — ABNORMAL LOW (ref 12.0–15.0)
Potassium: 4.7 mmol/L (ref 3.5–5.1)
Sodium: 139 mmol/L (ref 135–145)
TCO2: 24 mmol/L (ref 22–32)

## 2019-11-28 LAB — HEPATITIS B SURFACE ANTIGEN: Hepatitis B Surface Ag: NONREACTIVE

## 2019-11-28 LAB — RENAL FUNCTION PANEL
Albumin: 3.5 g/dL (ref 3.5–5.0)
Anion gap: 15 (ref 5–15)
BUN: 57 mg/dL — ABNORMAL HIGH (ref 6–20)
CO2: 23 mmol/L (ref 22–32)
Calcium: 8.1 mg/dL — ABNORMAL LOW (ref 8.9–10.3)
Chloride: 100 mmol/L (ref 98–111)
Creatinine, Ser: 12.2 mg/dL — ABNORMAL HIGH (ref 0.44–1.00)
GFR calc Af Amer: 4 mL/min — ABNORMAL LOW (ref >60)
GFR calc non Af Amer: 3 mL/min — ABNORMAL LOW (ref >60)
Glucose, Bld: 128 mg/dL — ABNORMAL HIGH (ref 70–99)
Phosphorus: 7.5 mg/dL — ABNORMAL HIGH (ref 2.5–4.6)
Potassium: 5.1 mmol/L (ref 3.5–5.1)
Sodium: 138 mmol/L (ref 135–145)

## 2019-11-28 LAB — HIV ANTIBODY (ROUTINE TESTING W REFLEX): HIV Screen 4th Generation wRfx: NONREACTIVE

## 2019-11-28 LAB — TROPONIN I (HIGH SENSITIVITY): Troponin I (High Sensitivity): 84 ng/L — ABNORMAL HIGH (ref <18)

## 2019-11-28 LAB — GLUCOSE, CAPILLARY
Glucose-Capillary: 114 mg/dL — ABNORMAL HIGH (ref 70–99)
Glucose-Capillary: 126 mg/dL — ABNORMAL HIGH (ref 70–99)

## 2019-11-28 LAB — HEPATITIS B SURFACE ANTIBODY,QUALITATIVE: Hep B S Ab: REACTIVE — AB

## 2019-11-28 LAB — SARS CORONAVIRUS 2 BY RT PCR (HOSPITAL ORDER, PERFORMED IN ~~LOC~~ HOSPITAL LAB): SARS Coronavirus 2: NEGATIVE

## 2019-11-28 SURGERY — INVASIVE LAB ABORTED CASE

## 2019-11-28 MED ORDER — NITROGLYCERIN 0.4 MG SL SUBL: 0.4000 mg | SUBLINGUAL_TABLET | SUBLINGUAL | Status: DC | PRN

## 2019-11-28 MED ORDER — GABAPENTIN 100 MG PO CAPS: 200.0000 mg | ORAL_CAPSULE | ORAL | Status: DC

## 2019-11-28 MED ORDER — DIPHENHYDRAMINE HCL 25 MG PO CAPS: 50.0000 mg | ORAL_CAPSULE | Freq: Every day | ORAL | Status: DC | PRN

## 2019-11-28 MED ORDER — ACETAMINOPHEN 325 MG PO TABS
ORAL_TABLET | ORAL | Status: AC
  Administered 2019-11-28: 650 mg via ORAL
  Filled 2019-11-28: qty 2

## 2019-11-28 MED ORDER — SODIUM CHLORIDE 0.9% FLUSH: 3.0000 mL | INTRAVENOUS | Status: DC | PRN

## 2019-11-28 MED ORDER — ACETAMINOPHEN 650 MG RE SUPP
650.0000 mg | Freq: Four times a day (QID) | RECTAL | Status: DC | PRN
  Filled 2019-11-28: qty 1

## 2019-11-28 MED ORDER — DOCUSATE SODIUM 100 MG PO CAPS: 100.0000 mg | ORAL_CAPSULE | Freq: Every day | ORAL | Status: DC | PRN

## 2019-11-28 MED ORDER — INSULIN GLARGINE 100 UNIT/ML ~~LOC~~ SOLN
12.0000 [IU] | Freq: Every day | SUBCUTANEOUS | Status: DC
  Administered 2019-11-29: 12 [IU] via SUBCUTANEOUS
  Filled 2019-11-28 (×2): qty 0.12

## 2019-11-28 MED ORDER — LABETALOL HCL 5 MG/ML IV SOLN
10.0000 mg | INTRAVENOUS | Status: DC | PRN
  Administered 2019-11-28: 10 mg via INTRAVENOUS
  Filled 2019-11-28: qty 4

## 2019-11-28 MED ORDER — GABAPENTIN 100 MG PO CAPS: 100.0000 mg | ORAL_CAPSULE | ORAL | Status: DC

## 2019-11-28 MED ORDER — LABETALOL HCL 5 MG/ML IV SOLN
INTRAVENOUS | Status: AC
  Filled 2019-11-28: qty 4

## 2019-11-28 MED ORDER — ACETAMINOPHEN 325 MG PO TABS: 650.0000 mg | ORAL_TABLET | Freq: Four times a day (QID) | ORAL | Status: DC | PRN

## 2019-11-28 MED ORDER — SEVELAMER CARBONATE 800 MG PO TABS: 800.0000 mg | ORAL_TABLET | ORAL | Status: DC

## 2019-11-28 MED ORDER — SIMETHICONE 80 MG PO CHEW: 80.0000 mg | CHEWABLE_TABLET | Freq: Four times a day (QID) | ORAL | Status: DC | PRN

## 2019-11-28 MED ORDER — FAMOTIDINE 20 MG PO TABS
20.0000 mg | ORAL_TABLET | Freq: Every day | ORAL | Status: DC | PRN
  Administered 2019-11-29: 20 mg via ORAL
  Filled 2019-11-28: qty 1

## 2019-11-28 MED ORDER — ASPIRIN EC 81 MG PO TBEC
81.0000 mg | DELAYED_RELEASE_TABLET | Freq: Every day | ORAL | Status: DC
  Administered 2019-11-29: 81 mg via ORAL
  Filled 2019-11-28: qty 1

## 2019-11-28 MED ORDER — ALBUTEROL SULFATE (2.5 MG/3ML) 0.083% IN NEBU: 2.5000 mg | INHALATION_SOLUTION | Freq: Four times a day (QID) | RESPIRATORY_TRACT | Status: DC | PRN

## 2019-11-28 MED ORDER — SODIUM CHLORIDE 0.9% FLUSH: 3.0000 mL | Freq: Two times a day (BID) | INTRAVENOUS | Status: DC

## 2019-11-28 MED ORDER — CARVEDILOL 25 MG PO TABS
25.0000 mg | ORAL_TABLET | Freq: Two times a day (BID) | ORAL | Status: DC
  Administered 2019-11-28 (×2): 25 mg via ORAL
  Filled 2019-11-28 (×3): qty 1

## 2019-11-28 MED ORDER — TIZANIDINE HCL 4 MG PO TABS: 4.0000 mg | ORAL_TABLET | Freq: Three times a day (TID) | ORAL | Status: DC | PRN

## 2019-11-28 MED ORDER — LISINOPRIL 5 MG PO TABS
5.0000 mg | ORAL_TABLET | Freq: Every day | ORAL | Status: DC
  Administered 2019-11-28 – 2019-11-29 (×2): 5 mg via ORAL
  Filled 2019-11-28 (×2): qty 1

## 2019-11-28 MED ORDER — LABETALOL HCL 5 MG/ML IV SOLN
20.0000 mg | Freq: Once | INTRAVENOUS | Status: AC
  Administered 2019-11-28: 20 mg via INTRAVENOUS

## 2019-11-28 MED ORDER — HEPARIN SODIUM (PORCINE) 1000 UNIT/ML IJ SOLN
INTRAMUSCULAR | Status: AC
  Filled 2019-11-28: qty 4

## 2019-11-28 MED ORDER — SALINE SPRAY 0.65 % NA SOLN
1.0000 | NASAL | Status: DC | PRN
  Filled 2019-11-28: qty 44

## 2019-11-28 MED ORDER — ATORVASTATIN CALCIUM 10 MG PO TABS
10.0000 mg | ORAL_TABLET | Freq: Every evening | ORAL | Status: DC
  Administered 2019-11-28 – 2019-11-29 (×2): 10 mg via ORAL
  Filled 2019-11-28 (×2): qty 1

## 2019-11-28 MED ORDER — RENA-VITE PO TABS
1.0000 | ORAL_TABLET | Freq: Every day | ORAL | Status: DC
  Administered 2019-11-29: 1 via ORAL
  Filled 2019-11-28: qty 1

## 2019-11-28 MED ORDER — HEPARIN SODIUM (PORCINE) 5000 UNIT/ML IJ SOLN
5000.0000 [IU] | Freq: Three times a day (TID) | INTRAMUSCULAR | Status: DC
  Administered 2019-11-28 (×2): 5000 [IU] via SUBCUTANEOUS
  Filled 2019-11-28 (×2): qty 1

## 2019-11-28 MED ORDER — SEVELAMER CARBONATE 800 MG PO TABS
1600.0000 mg | ORAL_TABLET | Freq: Three times a day (TID) | ORAL | Status: DC
  Administered 2019-11-29 (×2): 1600 mg via ORAL
  Filled 2019-11-28 (×2): qty 2

## 2019-11-28 MED ORDER — AMLODIPINE BESYLATE 10 MG PO TABS
10.0000 mg | ORAL_TABLET | Freq: Every day | ORAL | Status: DC
  Administered 2019-11-28 – 2019-11-29 (×2): 10 mg via ORAL
  Filled 2019-11-28 (×2): qty 1

## 2019-11-28 MED ORDER — LIDOCAINE-PRILOCAINE 2.5-2.5 % EX CREA: 1.0000 "application " | TOPICAL_CREAM | CUTANEOUS | Status: DC | PRN

## 2019-11-28 MED ORDER — INSULIN ASPART 100 UNIT/ML ~~LOC~~ SOLN
0.0000 [IU] | Freq: Three times a day (TID) | SUBCUTANEOUS | Status: DC
  Administered 2019-11-29 (×2): 1 [IU] via SUBCUTANEOUS

## 2019-11-28 MED ORDER — CHLORHEXIDINE GLUCONATE CLOTH 2 % EX PADS
6.0000 | MEDICATED_PAD | Freq: Every day | CUTANEOUS | Status: DC
  Administered 2019-11-29: 6 via TOPICAL

## 2019-11-28 SURGICAL SUPPLY — 10 items
BAG SNAP BAND KOVER 36X36 (MISCELLANEOUS) ×1 IMPLANT
COVER DOME SNAP 22 D (MISCELLANEOUS) ×1 IMPLANT
KIT MICROPUNCTURE NIT STIFF (SHEATH) IMPLANT
MAT PREVALON FULL STRYKER (MISCELLANEOUS) IMPLANT
PROTECTION STATION PRESSURIZED (MISCELLANEOUS)
SHEATH PROBE COVER 6X72 (BAG) ×1 IMPLANT
STATION PROTECTION PRESSURIZED (MISCELLANEOUS) ×1 IMPLANT
STOPCOCK MORSE 400PSI 3WAY (MISCELLANEOUS) ×1 IMPLANT
TRAY PV CATH (CUSTOM PROCEDURE TRAY) ×1 IMPLANT
TUBING CIL FLEX 10 FLL-RA (TUBING) ×1 IMPLANT

## 2019-11-28 NOTE — Progress Notes (Addendum)
Breathing easier; respirations less labored, less tachypneic. Dry nonproductive cough.  Patient stating she can't go home like this.

## 2019-11-28 NOTE — Progress Notes (Signed)
Patient transported from cath lab to 3E08 on bipap without complications. Patient taken off bipap after arrival and placed on 3L [place]. Vitals are stable. RN at bedside.

## 2019-11-28 NOTE — H&P (Signed)
Subjective:    Courtney Gray is a 49 y.o. female history of end-stage she states that she is now dialyzing in [place] after moving from [place].  Previous dialysis access placed twice in her right upper extremity which appears to be 1 fistula and a revision.  Currently on dialysis via catheter.  She is left-hand dominant.  No previous left arm access procedures.  She has had multiple infiltration events in the right upper extremity      Past Medical History:  Diagnosis Date  . Diabetes mellitus without complication ([place])   . High cholesterol   . Hypertension   . Renal disorder    History reviewed. No pertinent family history.      Past Surgical History:  Procedure Laterality Date  . AV FISTULA PLACEMENT     x2, unsuccessful.     Short Social History:  Social History       Tobacco Use  . Smoking status: Never Smoker  . Smokeless tobacco: Never Used  Substance Use Topics  . Alcohol use: Never         Allergies  Allergen Reactions  . Benzonatate     Other reaction(s): Hallucinations  . Hydralazine     Other reaction(s): Hallucinations  . No Known Allergies   . Hydrocodone Itching  . Morphine Itching  . Oxycodone Itching          Current Outpatient Medications  Medication Sig Dispense Refill  . amLODipine (NORVASC) 10 MG tablet Take 1 tablet by mouth daily.    Marland Kitchen atorvastatin (LIPITOR) 10 MG tablet Take 10 mg by mouth at bedtime.    . carvedilol (COREG) 25 MG tablet Take 25 mg by mouth 2 (two) times daily.    Marland Kitchen gabapentin (NEURONTIN) 100 MG capsule Take 1 capsule by mouth at bedtime.    Marland Kitchen glipiZIDE (GLUCOTROL) 5 MG tablet Take 5 mg by mouth 2 (two) times daily.    Marland Kitchen LANTUS SOLOSTAR 100 UNIT/ML Solostar Pen Inject 10 Units into the skin at bedtime.    Marland Kitchen lisinopril (ZESTRIL) 40 MG tablet Take 40 mg by mouth daily.    . ondansetron (ZOFRAN) 4 MG tablet Take 1 tablet (4 mg total) by mouth every 6 (six) hours. 12 tablet 0    . tiZANidine (ZANAFLEX) 4 MG tablet Take 4 mg by mouth 3 (three) times daily.     No current facility-administered medications for this visit.    Review of Systems  Constitutional:  Constitutional negative. HENT: HENT negative.  Eyes: Eyes negative.  Respiratory: Respiratory negative.  Cardiovascular: Cardiovascular negative.  GI: Gastrointestinal negative.  Musculoskeletal: Musculoskeletal negative.  Skin: Skin negative.  Neurological: Neurological negative. Hematologic: Hematologic/lymphatic negative.  Psychiatric: Psychiatric negative.        Objective:   Vitals:   11/28/19 0555  BP: (!) 173/108  Pulse: 87  Resp: 16  Temp: 98.4 F (36.9 C)  SpO2: 97%     Physical Exam HENT:     Head: Normocephalic.     Nose: Nose normal.  Eyes:     Pupils: Pupils are equal, round, and reactive to light.  Pulmonary:     Effort: Pulmonary effort is normal.  Abdominal:     General: Abdomen is flat.     Palpations: Abdomen is soft.  Musculoskeletal:        General: Swelling present. Normal range of motion.     Comments: There is pulsatility pulsatility in the right upper extremity but appears to be too deep for cannulation  Skin:    General: Skin is warm.     Capillary Refill: Capillary refill takes less than 2 seconds.  Neurological:     General: No focal deficit present.     Mental Status: She is alert.  Psychiatric:        Mood and Affect: Mood normal.        Behavior: Behavior normal.        Thought Content: Thought content normal.        Judgment: Judgment normal.         Assessment/Plan:      49 year old female on dialysis via tunneled dialysis catheter.  Has a previous what appears to be right arm cephalic vein fistula creation with superficialization.  This has been infiltrated has never really worked.  I have recommended fistulogram with further procedures from there which would possibly be revision of the fistula versus conversion to graft in  the right upper extremity which could be placed more lateral on the arm and would be less likely to be infiltrated.  At the very worst we would have to consider left arm access which is her dominant arm.    Hanson Medeiros C. Donzetta Matters, MD [facility] [place] Office: [phone] Pager: [phone]

## 2019-11-28 NOTE — Consult Note (Signed)
[facility] Nephrology Consult Note: Reason for Consult: To manage dialysis and dialysis related needs Referring Physician: Dr Tamala Julian, R  HPI:  Courtney Gray is an 49 y.o. female with history of HTN, HLD, DM, morbid obesity, ESRD on HD TTS at [facility], presented for outpatient fistulogram when she was short of breath on the table therefore we are consulted to manage dialysis.  Patient reports she is regular to her dialysis, last was on Saturday.  Usually gets 4 hours of treatment and she has been watching fluid.  She has right IJ tunnel catheter for the access.  The AV fistula has been clotted for around 2 years therefore she is following with vascular for further plan including fistulogram. On the table during procedure she was complaining of shortness of breath.  The chest x-ray with bibasilar atelectasis and possible edema. She currently on BiPAP.  Blood pressure elevated.  She has mild increased work of breathing however able to speak full sentences.  Denies chest pain, nausea, vomiting, headache or dizziness.  She reports getting heparin with her dialysis. Lab consistent with a potassium 5.1, phosphorus 7.5, hemoglobin 8.8.  Past Medical History:  Diagnosis Date  . Diabetes mellitus without complication ([place])   . High cholesterol   . Hypertension   . Renal disorder     Past Surgical History:  Procedure Laterality Date  . AV FISTULA PLACEMENT     x2, unsuccessful.     No family history on file.  Social History:  reports that she has never smoked. She has never used smokeless tobacco. She reports that she does not drink alcohol and does not use drugs.  Allergies:  Allergies  Allergen Reactions  . Benzonatate     Hallucinations  . Hydralazine     Hallucinations  . Amoxicillin Nausea And Vomiting  . Clonidine     Altered mental state  . Hydrocodone Itching  . Morphine Itching  . Oxycodone Itching    Medications: I have reviewed the patient's current  medications.   Results for orders placed or performed during the hospital encounter of 11/28/19 (from the past 48 hour(s))  SARS Coronavirus 2 by RT PCR (hospital order, performed in [facility] hospital lab) Nasopharyngeal Nasopharyngeal Swab     Status: None   Collection Time: 11/28/19  6:23 AM   Specimen: Nasopharyngeal Swab  Result Value Ref Range   SARS Coronavirus 2 NEGATIVE NEGATIVE    Comment: (NOTE) SARS-CoV-2 target nucleic acids are NOT DETECTED.  The SARS-CoV-2 RNA is generally detectable in upper and lower respiratory specimens during the acute phase of infection. The lowest concentration of SARS-CoV-2 viral copies this assay can detect is 250 copies / mL. A negative result does not preclude SARS-CoV-2 infection and should not be used as the sole basis for treatment or other patient management decisions.  A negative result may occur with improper specimen collection / handling, submission of specimen other than nasopharyngeal swab, presence of viral mutation(s) within the areas targeted by this assay, and inadequate number of viral copies (<250 copies / mL). A negative result must be combined with clinical observations, patient history, and epidemiological information.  Fact Sheet for Patients:   StrictlyIdeas.no  Fact Sheet for Healthcare Providers: BankingDealers.co.za  This test is not yet approved or  cleared by the [place] FDA and has been authorized for detection and/or diagnosis of SARS-CoV-2 by FDA under an Emergency Use Authorization (EUA).  This EUA will remain in effect (meaning this test can be used)  for the duration of the COVID-19 declaration under Section 564(b)(1) of the Act, 21 U.S.C. section 360bbb-3(b)(1), unless the authorization is terminated or revoked sooner.  Performed at [facility], [place] [address] [place], [place] [address]   I-STAT, [place] [address]     Status: Abnormal   Collection  Time: 11/28/19  6:46 AM  Result Value Ref Range   Sodium 139 135 - 145 mmol/L   Potassium 4.7 3.5 - 5.1 mmol/L   Chloride 100 98 - 111 mmol/L   BUN 54 (H) 6 - 20 mg/dL   Creatinine, Ser 12.10 (H) 0.44 - 1.00 mg/dL   Glucose, Bld 162 (H) 70 - 99 mg/dL    Comment: Glucose reference range applies only to samples taken after fasting for at least 8 hours.   Calcium, Ion 1.02 (L) 1.15 - 1.40 mmol/L   TCO2 24 22 - 32 mmol/L   Hemoglobin 9.5 (L) 12.0 - 15.0 g/dL   HCT 28.0 (L) 36 - 46 %  CBC with Differential/Platelet     Status: Abnormal   Collection Time: 11/28/19 11:49 AM  Result Value Ref Range   WBC 13.4 (H) 4.0 - 10.5 K/uL   RBC 2.98 (L) 3.87 - 5.11 MIL/uL   Hemoglobin 8.8 (L) 12.0 - 15.0 g/dL   HCT 28.8 (L) 36 - 46 %   MCV 96.6 80.0 - 100.0 fL   MCH 29.5 26.0 - 34.0 pg   MCHC 30.6 30.0 - 36.0 g/dL   RDW 14.3 11.5 - 15.5 %   Platelets 237 150 - 400 K/uL   nRBC 0.0 0.0 - 0.2 %   Neutrophils Relative % 81 %   Neutro Abs 10.9 (H) 1.7 - 7.7 K/uL   Lymphocytes Relative 9 %   Lymphs Abs 1.2 0.7 - 4.0 K/uL   Monocytes Relative 6 %   Monocytes Absolute 0.7 0 - 1 K/uL   Eosinophils Relative 3 %   Eosinophils Absolute 0.4 0 - 0 K/uL   Basophils Relative 0 %   Basophils Absolute 0.1 0 - 0 K/uL   Immature Granulocytes 1 %   Abs Immature Granulocytes 0.08 (H) 0.00 - 0.07 K/uL    Comment: Performed at [facility], [address] [place], [place] [address]  Renal function panel     Status: Abnormal   Collection Time: 11/28/19 11:49 AM  Result Value Ref Range   Sodium 138 135 - 145 mmol/L   Potassium 5.1 3.5 - 5.1 mmol/L   Chloride 100 98 - 111 mmol/L   CO2 23 22 - 32 mmol/L   Glucose, Bld 128 (H) 70 - 99 mg/dL    Comment: Glucose reference range applies only to samples taken after fasting for at least 8 hours.   BUN 57 (H) 6 - 20 mg/dL   Creatinine, Ser 12.20 (H) 0.44 - 1.00 mg/dL   Calcium 8.1 (L) 8.9 - 10.3 mg/dL   Phosphorus 7.5 (H) 2.5 - 4.6 mg/dL   Albumin 3.5 3.5 -  5.0 g/dL   GFR calc non Af Amer 3 (L) >60 mL/min   GFR calc Af Amer 4 (L) >60 mL/min   Anion gap 15 5 - 15    Comment: Performed at [facility] [address] [place], [place] [address]    X-ray chest PA or AP  Result Date: 11/28/2019 CLINICAL DATA:  Hypoxia. EXAM: CHEST  1 VIEW COMPARISON:  May 11, 2019. FINDINGS: Stable cardiomegaly. No pneumothorax or pleural effusion is noted.  Mild bibasilar subsegmental atelectasis or edema is noted. Bony thorax is unremarkable. Right internal jugular dialysis catheter is unchanged in position. IMPRESSION: Mild bibasilar subsegmental atelectasis or edema. Electronically Signed   By: Marijo Conception M.D.   On: 11/28/2019 11:30    ROS: As per H&P.  Rest systems were reviewed and are negative. Blood pressure (!) 191/92, pulse 69, temperature 98.4 F (36.9 C), temperature source Oral, resp. rate 18, height 5\' 5"  (1.651 m), weight (!) 158.8 kg, SpO2 100 %. Gen: NAD, comfortable Respiratory: Clear bilateral, no wheezing or crackle Cardiovascular: Regular rate rhythm S1-S2 normal, no rubs GI: Abdomen soft, nontender, nondistended Extremities, no cyanosis or clubbing, no edema Skin: No rash or ulcer Neurology: Alert, awake, following commands, oriented Dialysis Access: Right IJ TDC.  AV fistula has thrill and bruit.  Assessment/Plan:  #Acute shortness of breath/pulmonary edema: Plan for dialysis today with a goal UF of 3 to 4 kg.  Currently on BiPAP.  # ESRD: TTS at [facility].  HD today as above.  She will probably need another treatment tomorrow.  We can use tunnel catheter for the access.  Further plan of AV fistula including fistulogram per vascular surgery.  # Hypertensive urgency: Volume management with dialysis.  Resume home antihypertensive medication.  # Anemia of ESRD: Hemoglobin 8.8.  We will try to obtain outpatient record about erythropoietin and iron studies.  # Metabolic Bone Disease: Phosphorus level elevated therefore I will  start sevelamer.   Arlee Santosuosso Tanna Furry 11/28/2019, 12:51 PM

## 2019-11-28 NOTE — Progress Notes (Signed)
PCXR done

## 2019-11-28 NOTE — Plan of Care (Signed)

## 2019-11-28 NOTE — Op Note (Signed)
   Patient presented for outpatient fistulogram.  Unfortunately when she was placed on the table she began having difficulty breathing.  Upon sitting her up she did have significant coughing.  She was unable to lie flat and the procedure was subsequently canceled and will be rescheduled for another nondialysis day.  Jetta Murray C. Donzetta Matters, MD [facility] Office: [phone] Pager: [phone]

## 2019-11-28 NOTE — Progress Notes (Signed)
RT here to place patient on CPAP

## 2019-11-28 NOTE — H&P (Signed)
History and Physical    Courtney Gray IZT:[id number] DOB: 03-16-1971 DOA: 11/28/2019  Referring MD/NP/PA: Servando Snare, MD PCP: Patient, No Pcp Per  Patient coming from: Preop  Chief Complaint: Evaluation for AV fistula placed  I have personally briefly reviewed patient's old medical records in [place]   HPI: Courtney Gray is a 49 y.o. female with medical history significant of ESRD on HD(TTS), HTN, HLD, DM type II, and morbid obesity who initially presented for evaluation for AV fistula placement with vascular surgery.  Plan had been to do a fistulogram to decide on best possible placement of fistula due to multiple infiltration events in the right upper extremity.  She noticed that over the last 2 days she has not been able to lay flat.  Patient associated symptoms to her recently getting over a sinus infection and being anemic.  Noted associated symptoms of leg swelling and a nonproductive cough.  She dialyzes at [facility] dialysis in [place] and last had a full dialysis session on 8/21.  However, in preparation for the procedure patient was noted to be unable to lay back even 30 degrees.  She complained of being unable to catch her breath and was noted to have O2 saturation dropping less than 88%.  BiPAP was placed due to patient's symptoms.  Vital signs revealed her to be afebrile, pulse 69-1 15, respiration 18-32, and blood pressure elevated up to 222/62.  Labs obtained this morning revealed hemoglobin 9.5, potassium 4.7, BUN 54, creatinine 12.1, and  ionized calcium 1.02.  Chest x-ray showed mild bibasilar atelectasis or edema.  TRH was called from preop holding to admit patient.  Review of Systems  Constitutional: Positive for malaise/fatigue. Negative for fever.  HENT: Positive for sore throat. Negative for hearing loss.   Eyes: Negative for photophobia and discharge.  Respiratory: Positive for cough and shortness of breath. Negative for sputum production.   Cardiovascular:  Positive for chest pain and leg swelling.  Gastrointestinal: Negative for abdominal pain and vomiting.  Genitourinary: Negative for dysuria and hematuria.  Musculoskeletal: Negative for falls.  Neurological: Negative for focal weakness and loss of consciousness.  Endo/Heme/Allergies: Does not bruise/bleed easily.  Psychiatric/Behavioral: Negative for substance abuse. The patient has insomnia.     Past Medical History:  Diagnosis Date  . Diabetes mellitus without complication ([place])   . High cholesterol   . Hypertension   . Renal disorder     Past Surgical History:  Procedure Laterality Date  . AV FISTULA PLACEMENT     x2, unsuccessful.      reports that she has never smoked. She has never used smokeless tobacco. She reports that she does not drink alcohol and does not use drugs.  Allergies  Allergen Reactions  . Benzonatate     Hallucinations  . Hydralazine     Hallucinations  . Amoxicillin Nausea And Vomiting  . Clonidine     Altered mental state  . Hydrocodone Itching  . Morphine Itching  . Oxycodone Itching    No family history on file.  Prior to Admission medications   Medication Sig Start Date End Date Taking? Authorizing Provider  acetaminophen (TYLENOL) 500 MG tablet Take 1,000 mg by mouth every 6 (six) hours as needed for moderate pain or headache.   Yes [provider]  amLODipine (NORVASC) 10 MG tablet Take 10 mg by mouth daily at 12 noon.  03/27/18  Yes [provider]  aspirin EC 81 MG tablet Take 81 mg by mouth daily.  Yes [provider]  atorvastatin (LIPITOR) 10 MG tablet Take 10 mg by mouth every evening.  03/29/19  Yes [provider]  B Complex-C-Folic Acid (DIALYVITE TABLET) TABS Take 1 tablet by mouth daily.   Yes [provider]  carvedilol (COREG) 25 MG tablet Take 25 mg by mouth 2 (two) times daily. 04/20/19  Yes [provider]  diphenhydrAMINE (BENADRYL) 25 MG tablet Take 50 mg by mouth  daily as needed for allergies.   Yes [provider]  docusate sodium (COLACE) 100 MG capsule Take 100 mg by mouth daily as needed for mild constipation.    Yes [provider]  famotidine (PEPCID) 20 MG tablet Take 20 mg by mouth daily as needed for heartburn or indigestion.   Yes [provider]  gabapentin (NEURONTIN) 100 MG capsule Take 100-200 mg by mouth See admin instructions. Take 100 mg at night. Take 200 mg after dialysis on Tuesdays, Thursdays,and Saturdays 04/20/19  Yes [provider]  glipiZIDE (GLUCOTROL) 5 MG tablet Take 5 mg by mouth daily.   Yes [provider]  LANTUS SOLOSTAR 100 UNIT/ML Solostar Pen Inject 12 Units into the skin at bedtime.  03/18/19  Yes [provider]  lisinopril (ZESTRIL) 5 MG tablet Take 5 mg by mouth daily. 11/11/19  Yes [provider]  nitroGLYCERIN (NITROSTAT) 0.4 MG SL tablet Place 0.4 mg under the tongue every 5 (five) minutes as needed for chest pain.   Yes [provider]  ondansetron (ZOFRAN) 4 MG tablet Take 1 tablet (4 mg total) by mouth every 6 (six) hours. Patient taking differently: Take 4 mg by mouth every 8 (eight) hours as needed for nausea or vomiting.  05/11/19  Yes Madilyn Hook A, PA-C  sevelamer carbonate (RENVELA) 800 MG tablet Take 800-1,600 mg by mouth See admin instructions. Take 1600 mg with each meal and 800 mg with each snack 05/12/19  Yes [provider]  Simethicone (GAS-X PO) Take 1 tablet by mouth daily as needed (gas).   Yes [provider]  sodium chloride (OCEAN) 0.65 % SOLN nasal spray Place 1 spray into both nostrils in the morning and at bedtime.   Yes [provider]  tiZANidine (ZANAFLEX) 4 MG tablet Take 4 mg by mouth every 8 (eight) hours.  04/20/19  Yes [provider]  lidocaine-prilocaine (EMLA) cream Apply 1 application topically as needed (port access).    [provider]    Physical  Exam:  Constitutional: Morbidly obese female who appears to be more comfortable on BiPAP Vitals:   11/28/19 0925 11/28/19 0930 11/28/19 0940 11/28/19 0955  BP: (!) 222/62 (!) 145/105 (!) 171/99 (!) 196/94  Pulse: 88 69 69 71  Resp: (!) 24 19 19  (!) 24  Temp:      TempSrc:      SpO2: 97% 96% 97% 97%  Weight:      Height:       Eyes: PERRL, lids and conjunctivae normal ENMT: Mucous membranes are moist. Posterior pharynx clear of any exudate or lesions.   Neck: normal, supple, no masses, no thyromegaly Respiratory: Tachypneic with positive crackles heard in the mid to lower lung fields.  Currently on BiPAP with O2 saturations maintained greater than 90%. Cardiovascular: Regular rate and rhythm, no murmurs / rubs / gallops.  Trace lower extremity edema. 2+ pedal pulses. No carotid bruits.  Abdomen: no tenderness, no masses palpated. No hepatosplenomegaly. Bowel sounds positive.  Musculoskeletal: no clubbing / cyanosis. No joint deformity  upper and lower extremities. Good ROM, no contractures. Normal muscle tone.  Skin: no rashes, lesions, ulcers. No induration Neurologic: CN 2-12 grossly intact. Sensation intact, DTR normal. Strength 5/5 in all 4.  Psychiatric: Normal judgment and insight. Alert and oriented x 3. Normal mood.     Labs on Admission: I have personally reviewed following labs and imaging studies  CBC: Recent Labs  Lab 11/28/19 0646  HGB 9.5*  HCT 17.0*   Basic Metabolic Panel: Recent Labs  Lab 11/28/19 0646  NA 139  K 4.7  CL 100  GLUCOSE 162*  BUN 54*  CREATININE 12.10*   GFR: Estimated Creatinine Clearance: 8.7 mL/min (A) (by C-G formula based on SCr of 12.1 mg/dL (H)). Liver Function Tests: No results for input(s): AST, ALT, ALKPHOS, BILITOT, PROT, ALBUMIN in the last 168 hours. No results for input(s): LIPASE, AMYLASE in the last 168 hours. No results for input(s): AMMONIA in the last 168 hours. Coagulation Profile: No results for input(s): INR,  PROTIME in the last 168 hours. Cardiac Enzymes: No results for input(s): CKTOTAL, CKMB, CKMBINDEX, TROPONINI in the last 168 hours. BNP (last 3 results) No results for input(s): PROBNP in the last 8760 hours. HbA1C: No results for input(s): HGBA1C in the last 72 hours. CBG: No results for input(s): GLUCAP in the last 168 hours. Lipid Profile: No results for input(s): CHOL, HDL, LDLCALC, TRIG, CHOLHDL, LDLDIRECT in the last 72 hours. Thyroid Function Tests: No results for input(s): TSH, T4TOTAL, FREET4, T3FREE, THYROIDAB in the last 72 hours. Anemia Panel: No results for input(s): VITAMINB12, FOLATE, FERRITIN, TIBC, IRON, RETICCTPCT in the last 72 hours. Urine analysis: No results found for: COLORURINE, APPEARANCEUR, LABSPEC, PHURINE, GLUCOSEU, HGBUR, BILIRUBINUR, KETONESUR, PROTEINUR, UROBILINOGEN, NITRITE, LEUKOCYTESUR Sepsis Labs: Recent Results (from the past 240 hour(s))  SARS Coronavirus 2 by RT PCR (hospital order, performed in [facility] hospital lab) Nasopharyngeal Nasopharyngeal Swab     Status: None   Collection Time: 11/28/19  6:23 AM   Specimen: Nasopharyngeal Swab  Result Value Ref Range Status   SARS Coronavirus 2 NEGATIVE NEGATIVE Final    Comment: (NOTE) SARS-CoV-2 target nucleic acids are NOT DETECTED.  The SARS-CoV-2 RNA is generally detectable in upper and lower respiratory specimens during the acute phase of infection. The lowest concentration of SARS-CoV-2 viral copies this assay can detect is 250 copies / mL. A negative result does not preclude SARS-CoV-2 infection and should not be used as the sole basis for treatment or other patient management decisions.  A negative result may occur with improper specimen collection / handling, submission of specimen other than nasopharyngeal swab, presence of viral mutation(s) within the areas targeted by this assay, and inadequate number of viral copies (<250 copies / mL). A negative result must be combined with  clinical observations, patient history, and epidemiological information.  Fact Sheet for Patients:   StrictlyIdeas.no  Fact Sheet for Healthcare Providers: BankingDealers.co.za  This test is not yet approved or  cleared by the [place] FDA and has been authorized for detection and/or diagnosis of SARS-CoV-2 by FDA under an Emergency Use Authorization (EUA).  This EUA will remain in effect (meaning this test can be used) for the duration of the COVID-19 declaration under Section 564(b)(1) of the Act, 21 U.S.C. section 360bbb-3(b)(1), unless the authorization is terminated or revoked sooner.  Performed at [facility], [place] [address], [place], [place] [address]      Radiological Exams on Admission: No results found.  Chest x-ray: Independently reviewed.  Chest x-ray noted show concerning for mild bilateral pulmonary edema  Assessment/Plan Acute respiratory failure with hypoxia likely secondary to fluid overload: Patient was noted to have significant respiratory distress after attempts to lay down prior to her procedure.  O2 saturations noted to drop less than 88%.  On physical exam patient with crackles in the mid to lower lung fields.  Chest x-ray on BiPAP revealed mild basilar atelectasis/edema. -Admit to a progressive bed -Continuous pulse oximetry with oxygen as needed to maintain O2 saturation -Continue BiPAP and wean off when able -N.p.o. while on BiPAP -Albuterol nebs as needed for shortness of breath/wheezing -Check CBC with differential -Check BNP and troponin  ESRD on HD: Patient had presented for need of AV fistula access with vascular surgery.  Normally dialyzes at [facility] dialysis care in [place], [place].  She last dialyzed 2 days ago and is on a Tuesday, Thursday, Saturday schedule.  Labs from today noted potassium 4.7, BUN 54, and creatinine 12.10 on admission.  Suspect symptoms above secondary to patient  being acutely fluid overloaded. -Nephrology consulted and plan on taking patient for hemodialysis today -Monitor renal function panels daily -Follow-up with vascular surgery to see if they plan on possibly reattempting procedure while hospitalized.   Hypertensive urgency: Acute.  Systolic blood pressures were noted to be up into the 220s.  Patient had been given 20 mg of labetalol IV x1 dose.  Home blood pressure regimen includes Coreg 25 mg 2 times daily, lisinopril 5 mg daily, amlodipine 10 mg daily at noon. -Restart home blood pressure medications when able -Labetalol IV as needed as needed for elevated blood pressure greater than 180.  Diabetes mellitus type 2: Initial glucose which is mildly elevated at 162.  Home medication regimen includes glipizide 5 mg daily and Lantus 12 units nightly. -Hypoglycemic protocols -Check hemoglobin A1c -Hold glipizide -Continue Lantus 12 units nightly -CBGs before every meal with very sensitive SSI -Adjust insulin regimen as needed  Anemia of chronic disease: Hemoglobin 9.5 g/dL on admission which appears improved from previous. -Follow-up CBC with differential  Morbid obesity: BMI 58.24 kg/m  DVT prophylaxis: Heparin Code Status: Full Family Communication: To be updated Disposition Plan: Hopefully discharge home in 2 to 3 days Consults called: Nephrology Admission status: inpatient   Norval Morton MD [facility] Pager [phone]   If 7PM-7AM, please contact night-coverage [email or web address] Password [email or web address]  11/28/2019, 10:45 AM

## 2019-11-29 DIAGNOSIS — Z7984 Long term (current) use of oral hypoglycemic drugs: Secondary | ICD-10-CM | POA: Diagnosis not present

## 2019-11-29 DIAGNOSIS — Z992 Dependence on renal dialysis: Secondary | ICD-10-CM | POA: Diagnosis not present

## 2019-11-29 DIAGNOSIS — J9601 Acute respiratory failure with hypoxia: Secondary | ICD-10-CM | POA: Diagnosis not present

## 2019-11-29 DIAGNOSIS — E8770 Fluid overload, unspecified: Secondary | ICD-10-CM | POA: Diagnosis not present

## 2019-11-29 DIAGNOSIS — I160 Hypertensive urgency: Secondary | ICD-10-CM | POA: Diagnosis not present

## 2019-11-29 DIAGNOSIS — E1129 Type 2 diabetes mellitus with other diabetic kidney complication: Secondary | ICD-10-CM | POA: Diagnosis not present

## 2019-11-29 DIAGNOSIS — I120 Hypertensive chronic kidney disease with stage 5 chronic kidney disease or end stage renal disease: Secondary | ICD-10-CM | POA: Diagnosis not present

## 2019-11-29 DIAGNOSIS — Z79899 Other long term (current) drug therapy: Secondary | ICD-10-CM | POA: Diagnosis not present

## 2019-11-29 DIAGNOSIS — N186 End stage renal disease: Secondary | ICD-10-CM | POA: Diagnosis not present

## 2019-11-29 DIAGNOSIS — E1122 Type 2 diabetes mellitus with diabetic chronic kidney disease: Secondary | ICD-10-CM | POA: Diagnosis not present

## 2019-11-29 LAB — RENAL FUNCTION PANEL
Albumin: 3.3 g/dL — ABNORMAL LOW (ref 3.5–5.0)
Anion gap: 14 (ref 5–15)
BUN: 32 mg/dL — ABNORMAL HIGH (ref 6–20)
CO2: 24 mmol/L (ref 22–32)
Calcium: 8.3 mg/dL — ABNORMAL LOW (ref 8.9–10.3)
Chloride: 97 mmol/L — ABNORMAL LOW (ref 98–111)
Creatinine, Ser: 7.93 mg/dL — ABNORMAL HIGH (ref 0.44–1.00)
GFR calc Af Amer: 6 mL/min — ABNORMAL LOW (ref >60)
GFR calc non Af Amer: 5 mL/min — ABNORMAL LOW (ref >60)
Glucose, Bld: 158 mg/dL — ABNORMAL HIGH (ref 70–99)
Phosphorus: 5.1 mg/dL — ABNORMAL HIGH (ref 2.5–4.6)
Potassium: 3.5 mmol/L (ref 3.5–5.1)
Sodium: 135 mmol/L (ref 135–145)

## 2019-11-29 LAB — CBC
HCT: 26.5 % — ABNORMAL LOW (ref 36.0–46.0)
Hemoglobin: 8.4 g/dL — ABNORMAL LOW (ref 12.0–15.0)
MCH: 30.7 pg (ref 26.0–34.0)
MCHC: 31.7 g/dL (ref 30.0–36.0)
MCV: 96.7 fL (ref 80.0–100.0)
Platelets: 236 10*3/uL (ref 150–400)
RBC: 2.74 MIL/uL — ABNORMAL LOW (ref 3.87–5.11)
RDW: 14.5 % (ref 11.5–15.5)
WBC: 11.2 10*3/uL — ABNORMAL HIGH (ref 4.0–10.5)
nRBC: 0 % (ref 0.0–0.2)

## 2019-11-29 LAB — TROPONIN I (HIGH SENSITIVITY): Troponin I (High Sensitivity): 69 ng/L — ABNORMAL HIGH (ref <18)

## 2019-11-29 LAB — GLUCOSE, CAPILLARY
Glucose-Capillary: 178 mg/dL — ABNORMAL HIGH (ref 70–99)
Glucose-Capillary: 153 mg/dL — ABNORMAL HIGH (ref 70–99)
Glucose-Capillary: 134 mg/dL — ABNORMAL HIGH (ref 70–99)

## 2019-11-29 MED ORDER — HEPARIN SODIUM (PORCINE) 1000 UNIT/ML IJ SOLN
INTRAMUSCULAR | Status: AC
  Administered 2019-11-29: 1000 [IU]
  Filled 2019-11-29: qty 1

## 2019-11-29 MED ORDER — SEVELAMER CARBONATE 800 MG PO TABS: 1600.0000 mg | ORAL_TABLET | Freq: Three times a day (TID) | ORAL | Status: DC

## 2019-11-29 MED ORDER — SEVELAMER CARBONATE 800 MG PO TABS
800.0000 mg | ORAL_TABLET | ORAL | Status: DC | PRN
  Filled 2019-11-29: qty 1

## 2019-11-29 MED ORDER — CHLORHEXIDINE GLUCONATE CLOTH 2 % EX PADS: 6.0000 | MEDICATED_PAD | Freq: Every day | CUTANEOUS | Status: DC

## 2019-11-29 NOTE — Progress Notes (Signed)
D/C instructions given and reviewed. No questions asked at this time, encouraged to call with any concerns.

## 2019-11-29 NOTE — Discharge Instructions (Signed)
Courtney Gray,  You were in the hospital because of trouble breathing from too much fluid. You received hemodialysis with improvement of symptoms. You also qualified for oxygen. Please follow-up with your primary care physician.

## 2019-11-29 NOTE — Plan of Care (Signed)

## 2019-11-29 NOTE — Progress Notes (Signed)
Patient concerned about getting a cpap and O2 for home use. Says she needs a sleep study according to her healthcare team. She states has a hard time sleeping at night and her O2 sats decrease while she sleeps.

## 2019-11-29 NOTE — Progress Notes (Signed)
Courtney Gray  Assessment/ Plan: Pt is a 49 y.o. yo female  with history of HTN, HLD, DM, morbid obesity, ESRD on HD TTS at [facility], presented for outpatient fistulogram when she was short of breath on the table therefore we are consulted to manage dialysis.  #Acute shortness of breath/pulmonary edema: Had dialysis yesterday with 1.7 L UF, tolerated well.  Plan for next HD today as per schedule.  Shortness of breath is better.  Still requiring some oxygen however clinically looks much better.  # ESRD: TTS at [place].  Plan for hemodialysis today via tunneled dialysis catheter. Further plan of AV fistula including fistulogram per vascular surgery.  # Hypertensive urgency: Volume management with dialysis.  Continue home antihypertensive medication.  Monitor BP.  # Anemia of ESRD:  Hemoglobin is stable.  She might be able to go home today after dialysis.  ESA and iron treatment as outpatient.   # Metabolic Bone Disease:  Continue Renvela.  Subjective: Seen and examined at bedside.  Reports shortness of breath is much better.  Denies nausea, vomiting, chest pain.  Plan for another treatment today.  Likely going home today. Objective Vital signs in last 24 hours: Vitals:   11/28/19 2259 11/28/19 2354 11/29/19 0356 11/29/19 0800  BP: (!) 175/45 (!) 155/75 (!) 159/104 (!) 166/98  Pulse: 71 64 68 61  Resp: 16 20 19 19   Temp: 98.1 F (36.7 C) 98.3 F (36.8 C) 97.8 F (36.6 C) 98.1 F (36.7 C)  TempSrc: Oral Oral Oral Oral  SpO2: 100% 100% 100% 100%  Weight: (!) 167.7 kg  (!) 167.1 kg   Height:       Weight change: 9.341 kg  Intake/Output Summary (Last 24 hours) at 11/29/2019 1112 Last data filed at 11/29/2019 0500 Gross per 24 hour  Intake 480 ml  Output 1733 ml  Net -1253 ml       Labs: Basic Metabolic Panel: Recent Labs  Lab 11/28/19 0646 11/28/19 1149 11/29/19 0120  NA 139 138 135  K 4.7 5.1 3.5  CL 100 100 97*  CO2  --  23  24  GLUCOSE 162* 128* 158*  BUN 54* 57* 32*  CREATININE 12.10* 12.20* 7.93*  CALCIUM  --  8.1* 8.3*  PHOS  --  7.5* 5.1*   Liver Function Tests: Recent Labs  Lab 11/28/19 1149 11/29/19 0120  ALBUMIN 3.5 3.3*   No results for input(s): LIPASE, AMYLASE in the last 168 hours. No results for input(s): AMMONIA in the last 168 hours. CBC: Recent Labs  Lab 11/28/19 0646 11/28/19 1149 11/29/19 0120  WBC  --  13.4* 11.2*  NEUTROABS  --  10.9*  --   HGB 9.5* 8.8* 8.4*  HCT 28.0* 28.8* 26.5*  MCV  --  96.6 96.7  PLT  --  237 236   Cardiac Enzymes: No results for input(s): CKTOTAL, CKMB, CKMBINDEX, TROPONINI in the last 168 hours. CBG: Recent Labs  Lab 11/28/19 1419 11/28/19 2352 11/29/19 0612  GLUCAP 126* 114* 178*    Iron Studies: No results for input(s): IRON, TIBC, TRANSFERRIN, FERRITIN in the last 72 hours. Studies/Results: X-ray chest PA or AP  Result Date: 11/28/2019 CLINICAL DATA:  Hypoxia. EXAM: CHEST  1 VIEW COMPARISON:  May 11, 2019. FINDINGS: Stable cardiomegaly. No pneumothorax or pleural effusion is noted. Mild bibasilar subsegmental atelectasis or edema is noted. Bony thorax is unremarkable. Right internal jugular dialysis catheter is unchanged in position. IMPRESSION: Mild bibasilar subsegmental atelectasis or edema.  Electronically Signed   By: Marijo Conception M.D.   On: 11/28/2019 11:30   ABORTED INVASIVE LAB PROCEDURE  Result Date: 11/28/2019 This case was aborted.   Medications: Infusions:   Scheduled Medications: . amLODipine  10 mg Oral Q1200  . aspirin EC  81 mg Oral Daily  . atorvastatin  10 mg Oral QPM  . carvedilol  25 mg Oral BID  . Chlorhexidine Gluconate Cloth  6 each Topical Q0600  . Chlorhexidine Gluconate Cloth  6 each Topical Q0600  . [START ON 11/30/2019] gabapentin  100 mg Oral Once per day on Sun Mon Wed Fri  . gabapentin  200 mg Oral Once per day on Tue Thu Sat  . heparin  5,000 Units Subcutaneous Q8H  . insulin aspart  0-6  Units Subcutaneous TID WC  . insulin glargine  12 Units Subcutaneous QHS  . lisinopril  5 mg Oral Daily  . multivitamin  1 tablet Oral QHS  . sevelamer carbonate  1,600 mg Oral TID WC  . sevelamer carbonate  1,600 mg Oral TID WC    have reviewed scheduled and prn medications.  Physical Exam: General:NAD, comfortable Heart:RRR, s1s2 nl, no rub Lungs:clear b/l, no crackle Abdomen:soft, Non-tender, non-distended Extremities:No edema Dialysis Access: Has a TDC, AV fistula has  thrill  Shavanna Furnari Tanna Furry 11/29/2019,11:12 AM  LOS: 1 day  Pager: [phone]

## 2019-11-29 NOTE — Discharge Summary (Signed)
Physician Discharge Summary  Courtney Gray GYK:[id number] DOB: Oct 25, 1970 DOA: 11/28/2019  PCP: Patient, No Pcp Per  Admit date: 11/28/2019 Discharge date: 11/29/2019  Admitted From: Home Disposition: Home  Recommendations for Outpatient Follow-up:  1. Follow up with PCP in 1 week 2. Please obtain BMP/CBC in one week 3. Please follow up on the following pending results: None  Discharge Condition: Stable CODE STATUS: Full code Diet recommendation: Renal/carb modified   Brief/Interim Summary:  Admission HPI written by Norval Morton, MD   HPI: Courtney Gray is a 48 y.o. female with medical history significant of ESRD on HD(TTS), HTN, HLD, DM type II, and morbid obesity who initially presented for evaluation for AV fistula placement with vascular surgery.  Plan had been to do a fistulogram to decide on best possible placement of fistula due to multiple infiltration events in the right upper extremity.  She noticed that over the last 2 days she has not been able to lay flat.  Patient associated symptoms to her recently getting over a sinus infection and being anemic.  Noted associated symptoms of leg swelling and a nonproductive cough.  She dialyzes at [facility] dialysis in [place] and last had a full dialysis session on 8/21.  However, in preparation for the procedure patient was noted to be unable to lay back even 30 degrees.  She complained of being unable to catch her breath and was noted to have O2 saturation dropping less than 88%.  BiPAP was placed due to patient's symptoms.  Vital signs revealed her to be afebrile, pulse 69-1 15, respiration 18-32, and blood pressure elevated up to 222/62.  Labs obtained this morning revealed hemoglobin 9.5, potassium 4.7, BUN 54, creatinine 12.1, and  ionized calcium 1.02.  Chest x-ray showed mild bibasilar atelectasis or edema.  TRH was called from preop holding to admit patient.    Hospital course:  Acute respiratory failure with  hypoxia Secondary to volume overload. BiPAP required on admission. Symptoms improved with short HD session overnight. Patient ambulated and qualifies for home oxygen. Home with 3 L via [place].  Volume overload Secondary to need for HD. Improved with HD inpatient.  ESRD on HD Hypertensive urgency Diabetes mellitus, type 2 On insulin. Discontinued glipizide  Anemia of chronic disease Morbid obesity Body mass index is 61.3 kg/m.  Discharge Diagnoses:  Principal Problem:   Acute respiratory failure with hypoxia (HCC) Active Problems:   Fluid overload   Hypertensive urgency   Type 2 diabetes mellitus with other specified complication (HCC)   Anemia of chronic disease   Morbid obesity ([place])    Discharge Instructions   Allergies as of 11/29/2019      Reactions   Benzonatate    Hallucinations   Hydralazine    Hallucinations   Amoxicillin Nausea And Vomiting   Clonidine    Altered mental state   Hydrocodone Itching   Morphine Itching   Oxycodone Itching      Medication List    TAKE these medications   acetaminophen 500 MG tablet Commonly known as: TYLENOL Take 1,000 mg by mouth every 6 (six) hours as needed for moderate pain or headache.   amLODipine 10 MG tablet Commonly known as: NORVASC Take 10 mg by mouth daily at 12 noon.   aspirin EC 81 MG tablet Take 81 mg by mouth daily.   atorvastatin 10 MG tablet Commonly known as: LIPITOR Take 10 mg by mouth every evening.   carvedilol 25 MG tablet Commonly known as: COREG Take  25 mg by mouth 2 (two) times daily.   DIALYVITE TABLET Tabs Take 1 tablet by mouth daily.   diphenhydrAMINE 25 MG tablet Commonly known as: BENADRYL Take 50 mg by mouth daily as needed for allergies.   docusate sodium 100 MG capsule Commonly known as: COLACE Take 100 mg by mouth daily as needed for mild constipation.   famotidine 20 MG tablet Commonly known as: PEPCID Take 20 mg by mouth daily as needed for heartburn or  indigestion.   gabapentin 100 MG capsule Commonly known as: NEURONTIN Take 100-200 mg by mouth See admin instructions. Take 100 mg at night. Take 200 mg after dialysis on Tuesdays, Thursdays,and Saturdays   GAS-X PO Take 1 tablet by mouth daily as needed (gas).   glipiZIDE 5 MG tablet Commonly known as: GLUCOTROL Take 5 mg by mouth daily.   Lantus SoloStar 100 UNIT/ML Solostar Pen Generic drug: insulin glargine Inject 12 Units into the skin at bedtime.   lidocaine-prilocaine cream Commonly known as: EMLA Apply 1 application topically as needed (port access).   lisinopril 5 MG tablet Commonly known as: ZESTRIL Take 5 mg by mouth daily.   nitroGLYCERIN 0.4 MG SL tablet Commonly known as: NITROSTAT Place 0.4 mg under the tongue every 5 (five) minutes as needed for chest pain.   ondansetron 4 MG tablet Commonly known as: ZOFRAN Take 1 tablet (4 mg total) by mouth every 6 (six) hours. What changed:   when to take this  reasons to take this   sevelamer carbonate 800 MG tablet Commonly known as: RENVELA Take 800-1,600 mg by mouth See admin instructions. Take 1600 mg with each meal and 800 mg with each snack   sodium chloride 0.65 % Soln nasal spray Commonly known as: OCEAN Place 1 spray into both nostrils in the morning and at bedtime.   tiZANidine 4 MG tablet Commonly known as: ZANAFLEX Take 4 mg by mouth every 8 (eight) hours.            Durable Medical Equipment  (From admission, onward)         Start     Ordered   11/29/19 1401  For home use only DME oxygen  Once       Question Answer Comment  Length of Need Lifetime   Mode or (Route) Nasal cannula   Liters per Minute 3   Frequency Continuous (stationary and portable oxygen unit needed)   Oxygen delivery system Gas      11/29/19 1400          Allergies  Allergen Reactions  . Benzonatate     Hallucinations  . Hydralazine     Hallucinations  . Amoxicillin Nausea And Vomiting  . Clonidine      Altered mental state  . Hydrocodone Itching  . Morphine Itching  . Oxycodone Itching    Consultations:  Nephrology   Procedures/Studies: X-ray chest PA or AP  Result Date: 11/28/2019 CLINICAL DATA:  Hypoxia. EXAM: CHEST  1 VIEW COMPARISON:  May 11, 2019. FINDINGS: Stable cardiomegaly. No pneumothorax or pleural effusion is noted. Mild bibasilar subsegmental atelectasis or edema is noted. Bony thorax is unremarkable. Right internal jugular dialysis catheter is unchanged in position. IMPRESSION: Mild bibasilar subsegmental atelectasis or edema. Electronically Signed   By: Marijo Conception M.D.   On: 11/28/2019 11:30   ABORTED INVASIVE LAB PROCEDURE  Result Date: 11/28/2019 This case was aborted.      Subjective: Breathing improved.  Discharge Exam: Vitals:   11/29/19  0356 11/29/19 0800  BP: (!) 159/104 (!) 166/98  Pulse: 68 61  Resp: 19 19  Temp: 97.8 F (36.6 C) 98.1 F (36.7 C)  SpO2: 100% 100%   Vitals:   11/28/19 2259 11/28/19 2354 11/29/19 0356 11/29/19 0800  BP: (!) 175/45 (!) 155/75 (!) 159/104 (!) 166/98  Pulse: 71 64 68 61  Resp: 16 20 19 19   Temp: 98.1 F (36.7 C) 98.3 F (36.8 C) 97.8 F (36.6 C) 98.1 F (36.7 C)  TempSrc: Oral Oral Oral Oral  SpO2: 100% 100% 100% 100%  Weight: (!) 167.7 kg  (!) 167.1 kg   Height:        General: Pt is alert, awake, not in acute distress Cardiovascular: RRR, S1/S2 +, no rubs, no gallops Respiratory: Diminished, no wheezing, no rhonchi Abdominal: Soft, NT, ND, bowel sounds + Extremities: BLE edema, no cyanosis    The results of significant diagnostics from this hospitalization (including imaging, microbiology, ancillary and laboratory) are listed below for reference.     Microbiology: Recent Results (from the past 240 hour(s))  SARS Coronavirus 2 by RT PCR (hospital order, performed in [facility] hospital lab) Nasopharyngeal Nasopharyngeal Swab     Status: None   Collection Time: 11/28/19  6:23 AM    Specimen: Nasopharyngeal Swab  Result Value Ref Range Status   SARS Coronavirus 2 NEGATIVE NEGATIVE Final    Comment: (NOTE) SARS-CoV-2 target nucleic acids are NOT DETECTED.  The SARS-CoV-2 RNA is generally detectable in upper and lower respiratory specimens during the acute phase of infection. The lowest concentration of SARS-CoV-2 viral copies this assay can detect is 250 copies / mL. A negative result does not preclude SARS-CoV-2 infection and should not be used as the sole basis for treatment or other patient management decisions.  A negative result may occur with improper specimen collection / handling, submission of specimen other than nasopharyngeal swab, presence of viral mutation(s) within the areas targeted by this assay, and inadequate number of viral copies (<250 copies / mL). A negative result must be combined with clinical observations, patient history, and epidemiological information.  Fact Sheet for Patients:   StrictlyIdeas.no  Fact Sheet for Healthcare Providers: BankingDealers.co.za  This test is not yet approved or  cleared by the [place] FDA and has been authorized for detection and/or diagnosis of SARS-CoV-2 by FDA under an Emergency Use Authorization (EUA).  This EUA will remain in effect (meaning this test can be used) for the duration of the COVID-19 declaration under Section 564(b)(1) of the Act, 21 U.S.C. section 360bbb-3(b)(1), unless the authorization is terminated or revoked sooner.  Performed at [facility], [place] [address], [place], [place] [address]      Labs: BNP (last 3 results) Recent Labs    11/28/19 1149  BNP 301.6*   Basic Metabolic Panel: Recent Labs  Lab 11/28/19 0646 11/28/19 1149 11/29/19 0120  NA 139 138 135  K 4.7 5.1 3.5  CL 100 100 97*  CO2  --  23 24  GLUCOSE 162* 128* 158*  BUN 54* 57* 32*  CREATININE 12.10* 12.20* 7.93*  CALCIUM  --  8.1* 8.3*  PHOS   --  7.5* 5.1*   Liver Function Tests: Recent Labs  Lab 11/28/19 1149 11/29/19 0120  ALBUMIN 3.5 3.3*   No results for input(s): LIPASE, AMYLASE in the last 168 hours. No results for input(s): AMMONIA in the last 168 hours. CBC: Recent Labs  Lab 11/28/19 0646 11/28/19 1149 11/29/19 0120  WBC  --  13.4* 11.2*  NEUTROABS  --  10.9*  --   HGB 9.5* 8.8* 8.4*  HCT 28.0* 28.8* 26.5*  MCV  --  96.6 96.7  PLT  --  237 236   Cardiac Enzymes: No results for input(s): CKTOTAL, CKMB, CKMBINDEX, TROPONINI in the last 168 hours. BNP: Invalid input(s): POCBNP CBG: Recent Labs  Lab 11/28/19 1419 11/28/19 2352 11/29/19 0612  GLUCAP 126* 114* 178*   D-Dimer No results for input(s): DDIMER in the last 72 hours. Hgb A1c No results for input(s): HGBA1C in the last 72 hours. Lipid Profile No results for input(s): CHOL, HDL, LDLCALC, TRIG, CHOLHDL, LDLDIRECT in the last 72 hours. Thyroid function studies No results for input(s): TSH, T4TOTAL, T3FREE, THYROIDAB in the last 72 hours.  Invalid input(s): FREET3 Anemia work up No results for input(s): VITAMINB12, FOLATE, FERRITIN, TIBC, IRON, RETICCTPCT in the last 72 hours. Urinalysis No results found for: COLORURINE, APPEARANCEUR, [place], [place], [place], [place], [place], [place], PROTEINUR, UROBILINOGEN, NITRITE, LEUKOCYTESUR Sepsis Labs Invalid input(s): PROCALCITONIN,  WBC,  LACTICIDVEN Microbiology Recent Results (from the past 240 hour(s))  SARS Coronavirus 2 by RT PCR (hospital order, performed in [facility] hospital lab) Nasopharyngeal Nasopharyngeal Swab     Status: None   Collection Time: 11/28/19  6:23 AM   Specimen: Nasopharyngeal Swab  Result Value Ref Range Status   SARS Coronavirus 2 NEGATIVE NEGATIVE Final    Comment: (NOTE) SARS-CoV-2 target nucleic acids are NOT DETECTED.  The SARS-CoV-2 RNA is generally detectable in upper and lower respiratory specimens during the acute phase of infection. The  lowest concentration of SARS-CoV-2 viral copies this assay can detect is 250 copies / mL. A negative result does not preclude SARS-CoV-2 infection and should not be used as the sole basis for treatment or other patient management decisions.  A negative result may occur with improper specimen collection / handling, submission of specimen other than nasopharyngeal swab, presence of viral mutation(s) within the areas targeted by this assay, and inadequate number of viral copies (<250 copies / mL). A negative result must be combined with clinical observations, patient history, and epidemiological information.  Fact Sheet for Patients:   StrictlyIdeas.no  Fact Sheet for Healthcare Providers: BankingDealers.co.za  This test is not yet approved or  cleared by the [place] FDA and has been authorized for detection and/or diagnosis of SARS-CoV-2 by FDA under an Emergency Use Authorization (EUA).  This EUA will remain in effect (meaning this test can be used) for the duration of the COVID-19 declaration under Section 564(b)(1) of the Act, 21 U.S.C. section 360bbb-3(b)(1), unless the authorization is terminated or revoked sooner.  Performed at [facility], [place] [address], [place], [place] [address]      SIGNED:   Cordelia Poche, MD [facility] 11/29/2019, 9:29 AM

## 2019-11-29 NOTE — Progress Notes (Signed)
Renal Navigator received notification from HD RN that patient may be discharged today and may be able to go to her OP HD unit for treatment. Per clinic, her seat time is 12:15pm [facility]). Per bedside RN, patient needs to be evaluated for home O2, which will then need to be put in place prior to discharge readiness. Navigator discussed barriers to quick discharge today with patient's clinic, who can offer OP HD tomorrow at 2:30pm if this is acceptable.  Navigator met with patient at bedside to discuss. She is thankful that her breathing/potential need for home O2 is being evaluated, as this has been a concern for her. Navigator initially asked RN to call when O2 assessment has been completed, but feels there will not be adequate time for patient to get to her OP HD appointment today if home O2 is needed and if she is late to this appointment, she will not get a full treatment, which is needed given respiratory status. Transportation also poses as a barrier, as patient relies on [facility] for OP HD transportation. She lives with her daughter who does not have a car. It may be too late by the time we know about discharge to put [facility] in place for today or tomorrow. Navigator, patient and Dr. Carolin Sicks discussed and have decided that patient will dialyze here today. Patient was agreeable to anything, but appreciative of plan for HD here. She will need transportation home after-RN CM notified. Navigator notified inpt and outpatient HD units of plan. Navigator will fax discharge summary and Renal note to OP HD clinic to provide continuity of care.  Alphonzo Cruise, [place] Renal Navigator [phone]

## 2019-11-29 NOTE — TOC Progression Note (Signed)
Transition of Care [facility]) - Progression Note    Patient Details  Name: Courtney Gray MRN: [id number] Date of Birth: Feb 12, 1971  Transition of [facility] CM/SW Contact  Zenon Mayo, RN Phone Number: 11/29/2019, 3:55 PM  Clinical Narrative:    Patient is back from HD, she will need transportation home, she has the oxygen in her room.  NCM will assist with transport home.        Expected Discharge Plan and Services           Expected Discharge Date: 11/29/19                                     Social Determinants of Health (SDOH) Interventions    Readmission Risk Interventions No flowsheet data found.

## 2019-11-29 NOTE — Care Management CC44 (Signed)
Condition Code 44 Documentation Completed  Patient Details  Name: Rashiya Lofland MRN: [id number] Date of Birth: Aug 31, 1970   Condition Code 44 given:  Yes Patient signature on Condition Code 44 notice:  Yes Documentation of 2 MD's agreement:  Yes Code 44 added to claim:  Yes    Zenon Mayo, RN 11/29/2019, 4:12 PM

## 2019-11-29 NOTE — Care Management Obs Status (Signed)
[place] NOTIFICATION   Patient Details  Name: Kauri Garson MRN: [id number] Date of Birth: 10/09/1970   Medicare Observation Status Notification Given:  Yes    Zenon Mayo, RN 11/29/2019, 4:12 PM

## 2019-11-29 NOTE — Progress Notes (Signed)
  Mobility Specialist Criteria Algorithm Info.  SATURATION QUALIFICATIONS: (This note is used to comply with regulatory documentation for home oxygen)  Patient Saturations on Room Air at Rest = 93%  Patient Saturations on Room Air while Ambulating = 87%  Patient Saturations on 3 Liters of oxygen while Ambulating = 98%  Please briefly explain why patient needs home oxygen: Pt desaturated on room air while ambulating requiring 3L O2 to maintain a saturation > 95%.  Mobility Team:  [place] elevated:Self regulated Activity: Ambulated in hall (In chair before and after ambulation) Range of motion: Active;All extremities Level of assistance: Standby assist, set-up cues, supervision of patient - no hands on Assistive device: Cane Minutes sitting in chair:  Minutes stood: 20 minutes Minutes ambulated: 20 minutes Distance ambulated (ft): 1500 ft Mobility response: Tolerated well;RN notified (Required standing rest breaks x5) Bed Position: Chair (Recliner chair)  Pt tolerated ambulation well w/o any complaints. Was 2/4 dyspneic requiring multiple standing rest breaks x5 to catch breath. Completed education on energy conservation and pursed breathing.  11/29/2019 1:57 PM

## 2019-11-30 LAB — HEMOGLOBIN A1C
Hgb A1c MFr Bld: 8.4 % — ABNORMAL HIGH (ref 4.8–5.6)
Mean Plasma Glucose: 194 mg/dL

## 2019-12-01 DIAGNOSIS — N186 End stage renal disease: Secondary | ICD-10-CM | POA: Diagnosis not present

## 2019-12-01 DIAGNOSIS — N2581 Secondary hyperparathyroidism of renal origin: Secondary | ICD-10-CM | POA: Diagnosis not present

## 2019-12-01 DIAGNOSIS — Z992 Dependence on renal dialysis: Secondary | ICD-10-CM | POA: Diagnosis not present

## 2019-12-01 DIAGNOSIS — Z23 Encounter for immunization: Secondary | ICD-10-CM | POA: Diagnosis not present

## 2019-12-05 DIAGNOSIS — N2581 Secondary hyperparathyroidism of renal origin: Secondary | ICD-10-CM | POA: Diagnosis not present

## 2019-12-05 DIAGNOSIS — N186 End stage renal disease: Secondary | ICD-10-CM | POA: Diagnosis not present

## 2019-12-05 DIAGNOSIS — Z23 Encounter for immunization: Secondary | ICD-10-CM | POA: Diagnosis not present

## 2019-12-05 DIAGNOSIS — Z992 Dependence on renal dialysis: Secondary | ICD-10-CM | POA: Diagnosis not present

## 2019-12-06 DIAGNOSIS — Z992 Dependence on renal dialysis: Secondary | ICD-10-CM | POA: Diagnosis not present

## 2019-12-06 DIAGNOSIS — N2581 Secondary hyperparathyroidism of renal origin: Secondary | ICD-10-CM | POA: Diagnosis not present

## 2019-12-06 DIAGNOSIS — I10 Essential (primary) hypertension: Secondary | ICD-10-CM | POA: Diagnosis not present

## 2019-12-06 DIAGNOSIS — E785 Hyperlipidemia, unspecified: Secondary | ICD-10-CM | POA: Diagnosis not present

## 2019-12-06 DIAGNOSIS — N186 End stage renal disease: Secondary | ICD-10-CM | POA: Diagnosis not present

## 2019-12-06 DIAGNOSIS — Z23 Encounter for immunization: Secondary | ICD-10-CM | POA: Diagnosis not present

## 2019-12-08 DIAGNOSIS — N186 End stage renal disease: Secondary | ICD-10-CM | POA: Diagnosis not present

## 2019-12-08 DIAGNOSIS — N2581 Secondary hyperparathyroidism of renal origin: Secondary | ICD-10-CM | POA: Diagnosis not present

## 2019-12-08 DIAGNOSIS — E8779 Other fluid overload: Secondary | ICD-10-CM | POA: Diagnosis not present

## 2019-12-08 DIAGNOSIS — Z992 Dependence on renal dialysis: Secondary | ICD-10-CM | POA: Diagnosis not present

## 2019-12-08 DIAGNOSIS — E8770 Fluid overload, unspecified: Secondary | ICD-10-CM | POA: Diagnosis not present

## 2019-12-10 DIAGNOSIS — Z992 Dependence on renal dialysis: Secondary | ICD-10-CM | POA: Diagnosis not present

## 2019-12-10 DIAGNOSIS — E8779 Other fluid overload: Secondary | ICD-10-CM | POA: Diagnosis not present

## 2019-12-10 DIAGNOSIS — N186 End stage renal disease: Secondary | ICD-10-CM | POA: Diagnosis not present

## 2019-12-10 DIAGNOSIS — E8770 Fluid overload, unspecified: Secondary | ICD-10-CM | POA: Diagnosis not present

## 2019-12-10 DIAGNOSIS — N2581 Secondary hyperparathyroidism of renal origin: Secondary | ICD-10-CM | POA: Diagnosis not present

## 2019-12-13 DIAGNOSIS — N2581 Secondary hyperparathyroidism of renal origin: Secondary | ICD-10-CM | POA: Diagnosis not present

## 2019-12-13 DIAGNOSIS — E8779 Other fluid overload: Secondary | ICD-10-CM | POA: Diagnosis not present

## 2019-12-13 DIAGNOSIS — E8770 Fluid overload, unspecified: Secondary | ICD-10-CM | POA: Diagnosis not present

## 2019-12-13 DIAGNOSIS — N186 End stage renal disease: Secondary | ICD-10-CM | POA: Diagnosis not present

## 2019-12-13 DIAGNOSIS — Z992 Dependence on renal dialysis: Secondary | ICD-10-CM | POA: Diagnosis not present

## 2019-12-15 DIAGNOSIS — E8770 Fluid overload, unspecified: Secondary | ICD-10-CM | POA: Diagnosis not present

## 2019-12-15 DIAGNOSIS — E8779 Other fluid overload: Secondary | ICD-10-CM | POA: Diagnosis not present

## 2019-12-15 DIAGNOSIS — N186 End stage renal disease: Secondary | ICD-10-CM | POA: Diagnosis not present

## 2019-12-15 DIAGNOSIS — Z992 Dependence on renal dialysis: Secondary | ICD-10-CM | POA: Diagnosis not present

## 2019-12-15 DIAGNOSIS — N2581 Secondary hyperparathyroidism of renal origin: Secondary | ICD-10-CM | POA: Diagnosis not present

## 2019-12-15 NOTE — Progress Notes (Signed)
Attempted to reach pt Courtney Gray multiple attempts to reschedule for Rt arm fistulogram with possible revision versus graft conversion with Dr. Donzetta Matters. UTR letter mailed to patient.

## 2019-12-17 DIAGNOSIS — E8770 Fluid overload, unspecified: Secondary | ICD-10-CM | POA: Diagnosis not present

## 2019-12-17 DIAGNOSIS — N186 End stage renal disease: Secondary | ICD-10-CM | POA: Diagnosis not present

## 2019-12-17 DIAGNOSIS — Z992 Dependence on renal dialysis: Secondary | ICD-10-CM | POA: Diagnosis not present

## 2019-12-17 DIAGNOSIS — N2581 Secondary hyperparathyroidism of renal origin: Secondary | ICD-10-CM | POA: Diagnosis not present

## 2019-12-17 DIAGNOSIS — E8779 Other fluid overload: Secondary | ICD-10-CM | POA: Diagnosis not present

## 2019-12-20 DIAGNOSIS — N186 End stage renal disease: Secondary | ICD-10-CM | POA: Diagnosis not present

## 2019-12-20 DIAGNOSIS — Z992 Dependence on renal dialysis: Secondary | ICD-10-CM | POA: Diagnosis not present

## 2019-12-20 DIAGNOSIS — E8770 Fluid overload, unspecified: Secondary | ICD-10-CM | POA: Diagnosis not present

## 2019-12-20 DIAGNOSIS — E8779 Other fluid overload: Secondary | ICD-10-CM | POA: Diagnosis not present

## 2019-12-20 DIAGNOSIS — N2581 Secondary hyperparathyroidism of renal origin: Secondary | ICD-10-CM | POA: Diagnosis not present

## 2019-12-22 DIAGNOSIS — Z992 Dependence on renal dialysis: Secondary | ICD-10-CM | POA: Diagnosis not present

## 2019-12-22 DIAGNOSIS — N2581 Secondary hyperparathyroidism of renal origin: Secondary | ICD-10-CM | POA: Diagnosis not present

## 2019-12-22 DIAGNOSIS — E8779 Other fluid overload: Secondary | ICD-10-CM | POA: Diagnosis not present

## 2019-12-22 DIAGNOSIS — N186 End stage renal disease: Secondary | ICD-10-CM | POA: Diagnosis not present

## 2019-12-22 DIAGNOSIS — E8770 Fluid overload, unspecified: Secondary | ICD-10-CM | POA: Diagnosis not present

## 2019-12-23 DIAGNOSIS — Z7189 Other specified counseling: Secondary | ICD-10-CM | POA: Diagnosis not present

## 2019-12-23 DIAGNOSIS — Z0000 Encounter for general adult medical examination without abnormal findings: Secondary | ICD-10-CM | POA: Diagnosis not present

## 2019-12-23 DIAGNOSIS — E1165 Type 2 diabetes mellitus with hyperglycemia: Secondary | ICD-10-CM | POA: Diagnosis not present

## 2019-12-23 DIAGNOSIS — I10 Essential (primary) hypertension: Secondary | ICD-10-CM | POA: Diagnosis not present

## 2019-12-23 DIAGNOSIS — E119 Type 2 diabetes mellitus without complications: Secondary | ICD-10-CM | POA: Diagnosis not present

## 2019-12-23 DIAGNOSIS — Z299 Encounter for prophylactic measures, unspecified: Secondary | ICD-10-CM | POA: Diagnosis not present

## 2019-12-24 DIAGNOSIS — E8770 Fluid overload, unspecified: Secondary | ICD-10-CM | POA: Diagnosis not present

## 2019-12-24 DIAGNOSIS — Z992 Dependence on renal dialysis: Secondary | ICD-10-CM | POA: Diagnosis not present

## 2019-12-24 DIAGNOSIS — E8779 Other fluid overload: Secondary | ICD-10-CM | POA: Diagnosis not present

## 2019-12-24 DIAGNOSIS — N186 End stage renal disease: Secondary | ICD-10-CM | POA: Diagnosis not present

## 2019-12-24 DIAGNOSIS — N2581 Secondary hyperparathyroidism of renal origin: Secondary | ICD-10-CM | POA: Diagnosis not present

## 2019-12-27 DIAGNOSIS — E8779 Other fluid overload: Secondary | ICD-10-CM | POA: Diagnosis not present

## 2019-12-27 DIAGNOSIS — Z992 Dependence on renal dialysis: Secondary | ICD-10-CM | POA: Diagnosis not present

## 2019-12-27 DIAGNOSIS — N2581 Secondary hyperparathyroidism of renal origin: Secondary | ICD-10-CM | POA: Diagnosis not present

## 2019-12-27 DIAGNOSIS — N186 End stage renal disease: Secondary | ICD-10-CM | POA: Diagnosis not present

## 2019-12-27 DIAGNOSIS — E8770 Fluid overload, unspecified: Secondary | ICD-10-CM | POA: Diagnosis not present

## 2019-12-28 DIAGNOSIS — J9601 Acute respiratory failure with hypoxia: Secondary | ICD-10-CM | POA: Diagnosis not present

## 2019-12-29 DIAGNOSIS — N2581 Secondary hyperparathyroidism of renal origin: Secondary | ICD-10-CM | POA: Diagnosis not present

## 2019-12-29 DIAGNOSIS — Z992 Dependence on renal dialysis: Secondary | ICD-10-CM | POA: Diagnosis not present

## 2019-12-29 DIAGNOSIS — N186 End stage renal disease: Secondary | ICD-10-CM | POA: Diagnosis not present

## 2019-12-29 DIAGNOSIS — E8770 Fluid overload, unspecified: Secondary | ICD-10-CM | POA: Diagnosis not present

## 2019-12-29 DIAGNOSIS — E8779 Other fluid overload: Secondary | ICD-10-CM | POA: Diagnosis not present

## 2019-12-30 DIAGNOSIS — J9601 Acute respiratory failure with hypoxia: Secondary | ICD-10-CM | POA: Diagnosis not present

## 2019-12-31 DIAGNOSIS — N186 End stage renal disease: Secondary | ICD-10-CM | POA: Diagnosis not present

## 2019-12-31 DIAGNOSIS — E8770 Fluid overload, unspecified: Secondary | ICD-10-CM | POA: Diagnosis not present

## 2019-12-31 DIAGNOSIS — E8779 Other fluid overload: Secondary | ICD-10-CM | POA: Diagnosis not present

## 2019-12-31 DIAGNOSIS — N2581 Secondary hyperparathyroidism of renal origin: Secondary | ICD-10-CM | POA: Diagnosis not present

## 2019-12-31 DIAGNOSIS — Z992 Dependence on renal dialysis: Secondary | ICD-10-CM | POA: Diagnosis not present

## 2020-01-03 DIAGNOSIS — E8770 Fluid overload, unspecified: Secondary | ICD-10-CM | POA: Diagnosis not present

## 2020-01-03 DIAGNOSIS — E8779 Other fluid overload: Secondary | ICD-10-CM | POA: Diagnosis not present

## 2020-01-03 DIAGNOSIS — D509 Iron deficiency anemia, unspecified: Secondary | ICD-10-CM | POA: Diagnosis not present

## 2020-01-03 DIAGNOSIS — Z992 Dependence on renal dialysis: Secondary | ICD-10-CM | POA: Diagnosis not present

## 2020-01-03 DIAGNOSIS — E119 Type 2 diabetes mellitus without complications: Secondary | ICD-10-CM | POA: Diagnosis not present

## 2020-01-03 DIAGNOSIS — N186 End stage renal disease: Secondary | ICD-10-CM | POA: Diagnosis not present

## 2020-01-03 DIAGNOSIS — N2581 Secondary hyperparathyroidism of renal origin: Secondary | ICD-10-CM | POA: Diagnosis not present

## 2020-01-04 DIAGNOSIS — E8779 Other fluid overload: Secondary | ICD-10-CM | POA: Diagnosis not present

## 2020-01-04 DIAGNOSIS — E8770 Fluid overload, unspecified: Secondary | ICD-10-CM | POA: Diagnosis not present

## 2020-01-04 DIAGNOSIS — N2581 Secondary hyperparathyroidism of renal origin: Secondary | ICD-10-CM | POA: Diagnosis not present

## 2020-01-04 DIAGNOSIS — Z992 Dependence on renal dialysis: Secondary | ICD-10-CM | POA: Diagnosis not present

## 2020-01-04 DIAGNOSIS — N186 End stage renal disease: Secondary | ICD-10-CM | POA: Diagnosis not present

## 2020-01-05 DIAGNOSIS — N186 End stage renal disease: Secondary | ICD-10-CM | POA: Diagnosis not present

## 2020-01-05 DIAGNOSIS — E8770 Fluid overload, unspecified: Secondary | ICD-10-CM | POA: Diagnosis not present

## 2020-01-05 DIAGNOSIS — N2581 Secondary hyperparathyroidism of renal origin: Secondary | ICD-10-CM | POA: Diagnosis not present

## 2020-01-05 DIAGNOSIS — Z992 Dependence on renal dialysis: Secondary | ICD-10-CM | POA: Diagnosis not present

## 2020-01-05 DIAGNOSIS — E8779 Other fluid overload: Secondary | ICD-10-CM | POA: Diagnosis not present

## 2020-01-07 DIAGNOSIS — N186 End stage renal disease: Secondary | ICD-10-CM | POA: Diagnosis not present

## 2020-01-07 DIAGNOSIS — E8770 Fluid overload, unspecified: Secondary | ICD-10-CM | POA: Diagnosis not present

## 2020-01-07 DIAGNOSIS — N2581 Secondary hyperparathyroidism of renal origin: Secondary | ICD-10-CM | POA: Diagnosis not present

## 2020-01-07 DIAGNOSIS — Z992 Dependence on renal dialysis: Secondary | ICD-10-CM | POA: Diagnosis not present

## 2020-01-10 DIAGNOSIS — N2581 Secondary hyperparathyroidism of renal origin: Secondary | ICD-10-CM | POA: Diagnosis not present

## 2020-01-10 DIAGNOSIS — E8770 Fluid overload, unspecified: Secondary | ICD-10-CM | POA: Diagnosis not present

## 2020-01-10 DIAGNOSIS — N186 End stage renal disease: Secondary | ICD-10-CM | POA: Diagnosis not present

## 2020-01-10 DIAGNOSIS — Z992 Dependence on renal dialysis: Secondary | ICD-10-CM | POA: Diagnosis not present

## 2020-01-11 DIAGNOSIS — N186 End stage renal disease: Secondary | ICD-10-CM | POA: Diagnosis not present

## 2020-01-11 DIAGNOSIS — E8770 Fluid overload, unspecified: Secondary | ICD-10-CM | POA: Diagnosis not present

## 2020-01-11 DIAGNOSIS — N2581 Secondary hyperparathyroidism of renal origin: Secondary | ICD-10-CM | POA: Diagnosis not present

## 2020-01-11 DIAGNOSIS — Z992 Dependence on renal dialysis: Secondary | ICD-10-CM | POA: Diagnosis not present

## 2020-01-12 DIAGNOSIS — Z992 Dependence on renal dialysis: Secondary | ICD-10-CM | POA: Diagnosis not present

## 2020-01-12 DIAGNOSIS — E8770 Fluid overload, unspecified: Secondary | ICD-10-CM | POA: Diagnosis not present

## 2020-01-12 DIAGNOSIS — N186 End stage renal disease: Secondary | ICD-10-CM | POA: Diagnosis not present

## 2020-01-12 DIAGNOSIS — N2581 Secondary hyperparathyroidism of renal origin: Secondary | ICD-10-CM | POA: Diagnosis not present

## 2020-01-14 DIAGNOSIS — Z992 Dependence on renal dialysis: Secondary | ICD-10-CM | POA: Diagnosis not present

## 2020-01-14 DIAGNOSIS — N186 End stage renal disease: Secondary | ICD-10-CM | POA: Diagnosis not present

## 2020-01-14 DIAGNOSIS — E8770 Fluid overload, unspecified: Secondary | ICD-10-CM | POA: Diagnosis not present

## 2020-01-14 DIAGNOSIS — N2581 Secondary hyperparathyroidism of renal origin: Secondary | ICD-10-CM | POA: Diagnosis not present

## 2020-01-17 DIAGNOSIS — N186 End stage renal disease: Secondary | ICD-10-CM | POA: Diagnosis not present

## 2020-01-17 DIAGNOSIS — E8770 Fluid overload, unspecified: Secondary | ICD-10-CM | POA: Diagnosis not present

## 2020-01-17 DIAGNOSIS — Z992 Dependence on renal dialysis: Secondary | ICD-10-CM | POA: Diagnosis not present

## 2020-01-17 DIAGNOSIS — N2581 Secondary hyperparathyroidism of renal origin: Secondary | ICD-10-CM | POA: Diagnosis not present

## 2020-01-19 DIAGNOSIS — E8770 Fluid overload, unspecified: Secondary | ICD-10-CM | POA: Diagnosis not present

## 2020-01-19 DIAGNOSIS — N186 End stage renal disease: Secondary | ICD-10-CM | POA: Diagnosis not present

## 2020-01-19 DIAGNOSIS — Z992 Dependence on renal dialysis: Secondary | ICD-10-CM | POA: Diagnosis not present

## 2020-01-19 DIAGNOSIS — N2581 Secondary hyperparathyroidism of renal origin: Secondary | ICD-10-CM | POA: Diagnosis not present

## 2020-01-20 DIAGNOSIS — I10 Essential (primary) hypertension: Secondary | ICD-10-CM | POA: Diagnosis not present

## 2020-01-20 DIAGNOSIS — E1122 Type 2 diabetes mellitus with diabetic chronic kidney disease: Secondary | ICD-10-CM | POA: Diagnosis not present

## 2020-01-20 DIAGNOSIS — E1165 Type 2 diabetes mellitus with hyperglycemia: Secondary | ICD-10-CM | POA: Diagnosis not present

## 2020-01-20 DIAGNOSIS — M79674 Pain in right toe(s): Secondary | ICD-10-CM | POA: Diagnosis not present

## 2020-01-20 DIAGNOSIS — Z299 Encounter for prophylactic measures, unspecified: Secondary | ICD-10-CM | POA: Diagnosis not present

## 2020-01-21 DIAGNOSIS — N186 End stage renal disease: Secondary | ICD-10-CM | POA: Diagnosis not present

## 2020-01-21 DIAGNOSIS — Z992 Dependence on renal dialysis: Secondary | ICD-10-CM | POA: Diagnosis not present

## 2020-01-21 DIAGNOSIS — E8770 Fluid overload, unspecified: Secondary | ICD-10-CM | POA: Diagnosis not present

## 2020-01-21 DIAGNOSIS — N2581 Secondary hyperparathyroidism of renal origin: Secondary | ICD-10-CM | POA: Diagnosis not present

## 2020-01-24 ENCOUNTER — Emergency Department (HOSPITAL_COMMUNITY)
Admission: EM | Admit: 2020-01-24 | Discharge: 2020-01-25 | Disposition: A | Discharge status: Home / Self Care | Payer: Medicare Other | Attending: Emergency Medicine | Admitting: Emergency Medicine

## 2020-01-24 ENCOUNTER — Other Ambulatory Visit: Payer: Self-pay

## 2020-01-24 ENCOUNTER — Encounter (HOSPITAL_COMMUNITY): Payer: Self-pay

## 2020-01-24 DIAGNOSIS — Z794 Long term (current) use of insulin: Secondary | ICD-10-CM | POA: Insufficient documentation

## 2020-01-24 DIAGNOSIS — Z992 Dependence on renal dialysis: Secondary | ICD-10-CM | POA: Insufficient documentation

## 2020-01-24 DIAGNOSIS — I10 Essential (primary) hypertension: Secondary | ICD-10-CM | POA: Diagnosis not present

## 2020-01-24 DIAGNOSIS — E8770 Fluid overload, unspecified: Secondary | ICD-10-CM | POA: Diagnosis not present

## 2020-01-24 DIAGNOSIS — E119 Type 2 diabetes mellitus without complications: Secondary | ICD-10-CM | POA: Diagnosis not present

## 2020-01-24 DIAGNOSIS — Z7982 Long term (current) use of aspirin: Secondary | ICD-10-CM | POA: Insufficient documentation

## 2020-01-24 DIAGNOSIS — R252 Cramp and spasm: Secondary | ICD-10-CM | POA: Insufficient documentation

## 2020-01-24 DIAGNOSIS — Z79899 Other long term (current) drug therapy: Secondary | ICD-10-CM | POA: Insufficient documentation

## 2020-01-24 DIAGNOSIS — N186 End stage renal disease: Secondary | ICD-10-CM | POA: Diagnosis not present

## 2020-01-24 DIAGNOSIS — N2581 Secondary hyperparathyroidism of renal origin: Secondary | ICD-10-CM | POA: Diagnosis not present

## 2020-01-24 LAB — CBC WITH DIFFERENTIAL/PLATELET
Abs Immature Granulocytes: 0.05 10*3/uL (ref 0.00–0.07)
Basophils Absolute: 0.1 10*3/uL (ref 0.0–0.1)
Basophils Relative: 0 %
Eosinophils Absolute: 0.4 10*3/uL (ref 0.0–0.5)
Eosinophils Relative: 3 %
HCT: 32.9 % — ABNORMAL LOW (ref 36.0–46.0)
Hemoglobin: 10.4 g/dL — ABNORMAL LOW (ref 12.0–15.0)
Immature Granulocytes: 0 %
Lymphocytes Relative: 9 %
Lymphs Abs: 1 10*3/uL (ref 0.7–4.0)
MCH: 30.9 pg (ref 26.0–34.0)
MCHC: 31.6 g/dL (ref 30.0–36.0)
MCV: 97.6 fL (ref 80.0–100.0)
Monocytes Absolute: 0.5 10*3/uL (ref 0.1–1.0)
Monocytes Relative: 5 %
Neutro Abs: 9.5 10*3/uL — ABNORMAL HIGH (ref 1.7–7.7)
Neutrophils Relative %: 83 %
Platelets: 253 10*3/uL (ref 150–400)
RBC: 3.37 MIL/uL — ABNORMAL LOW (ref 3.87–5.11)
RDW: 15.3 % (ref 11.5–15.5)
WBC: 11.6 10*3/uL — ABNORMAL HIGH (ref 4.0–10.5)
nRBC: 0 % (ref 0.0–0.2)

## 2020-01-24 LAB — COMPREHENSIVE METABOLIC PANEL
ALT: 15 U/L (ref 0–44)
AST: 16 U/L (ref 15–41)
Albumin: 4.1 g/dL (ref 3.5–5.0)
Alkaline Phosphatase: 83 U/L (ref 38–126)
Anion gap: 18 — ABNORMAL HIGH (ref 5–15)
BUN: 24 mg/dL — ABNORMAL HIGH (ref 6–20)
CO2: 26 mmol/L (ref 22–32)
Calcium: 8.9 mg/dL (ref 8.9–10.3)
Chloride: 91 mmol/L — ABNORMAL LOW (ref 98–111)
Creatinine, Ser: 7.65 mg/dL — ABNORMAL HIGH (ref 0.44–1.00)
GFR, Estimated: 6 mL/min — ABNORMAL LOW (ref >60)
Glucose, Bld: 80 mg/dL (ref 70–99)
Potassium: 3.2 mmol/L — ABNORMAL LOW (ref 3.5–5.1)
Sodium: 135 mmol/L (ref 135–145)
Total Bilirubin: 1 mg/dL (ref 0.3–1.2)
Total Protein: 9.1 g/dL — ABNORMAL HIGH (ref 6.5–8.1)

## 2020-01-24 LAB — CBG MONITORING, ED: Glucose-Capillary: 81 mg/dL (ref 70–99)

## 2020-01-24 MED ORDER — HYDRALAZINE HCL 25 MG PO TABS: 25.0000 mg | ORAL_TABLET | Freq: Once | ORAL | Status: DC

## 2020-01-24 NOTE — ED Notes (Signed)
This RN spoke with Delcie Roch at [employer]. Transportation home arranged with the assistance of Delcie Roch through [employer] States that transportation services can take up to 3 hours for transportation after discharge.  [place] number [phone] and can call [phone] to check status of transportation request.  Pt made aware of transportation at this time.

## 2020-01-24 NOTE — ED Notes (Signed)
This RN spoke to [employer] regarding transportation. Per [employer], transportation is projected to be 0045, but if staff have not heard from staff prior to or at [place] then to call and request a status update. This RN verbalized understanding at this time.

## 2020-01-24 NOTE — ED Notes (Signed)
Pt assisted into a recliner for comfort at this time. Provided with a meal tray and drink per request. All questions and concerns voiced about current plan of care addressed at this time. Pt educated on transportation arrangements and is in agreement. This RN notes that patient is sitting in recliner, new oxygen tank at 2,000 psi in place at 3L [place]. Pt sitting in front of nurses stations for better observation.

## 2020-01-24 NOTE — ED Notes (Addendum)
Introduced self to patient and explained role in plan of care. Bed is locked in the lowest position, side rails x2, call bell within reach. All questions and concerns voiced addressed by this RN. ED provider at bedside for assessment at this time.

## 2020-01-24 NOTE — ED Notes (Signed)
Pt provided a cup of ice at this time, educated that pt is okay to eat and drink. This RN notes that patient is tolerating PO intake without distress. Pt provided with a warm blanket and placed on a new portable oxygen tank at this time.

## 2020-01-24 NOTE — Discharge Instructions (Signed)
Please call your PCP in the morning to discuss your ED visit and be sure to make your route dialysis appointment.

## 2020-01-24 NOTE — ED Provider Notes (Signed)
[facility] Provider Note   CSN: [id number] Arrival date & time: 01/24/20  1652     History Chief Complaint  Patient presents with  . Hypertension    Courtney Gray is a 49 y.o. female.  The history is provided by the patient. No language interpreter was used.  Hypertension This is a recurrent problem. The current episode started 12 to 24 hours ago. The problem occurs constantly. The problem has been gradually worsening. Nothing aggravates the symptoms. Nothing relieves the symptoms. She has tried nothing for the symptoms. The treatment provided no relief.  Pt complains of cramps in her legs and her blood pressure being high.  Pt reports she had full dialysis today.  Pt reports her blood pressure goes up when she is hot and stressed.  Pt reports she did not eat or drink today.  Pt reports she has leg cramps when she does not eat and drink.      Past Medical History:  Diagnosis Date  . Diabetes mellitus without complication ([place])   . High cholesterol   . Hypertension   . Renal disorder     Patient Active Problem List   Diagnosis Date Noted  . Acute respiratory failure with hypoxia ([place]) 11/28/2019  . Fluid overload 11/28/2019  . Hypertensive urgency 11/28/2019  . Type 2 diabetes mellitus with other specified complication ([place]) 45/06/8880  . Anemia of chronic disease 11/28/2019  . Morbid obesity ([place]) 11/28/2019    Past Surgical History:  Procedure Laterality Date  . AV FISTULA PLACEMENT     x2, unsuccessful.      OB History   No obstetric history on file.     History reviewed. No pertinent family history.  Social History   Tobacco Use  . Smoking status: Never Smoker  . Smokeless tobacco: Never Used  Substance Use Topics  . Alcohol use: Never  . Drug use: Never    Home Medications Prior to Admission medications   Medication Sig Start Date End Date Taking? Authorizing Provider  acetaminophen (TYLENOL) 500 MG tablet Take 1,000 mg  by mouth every 6 (six) hours as needed for moderate pain or headache.    [provider]  amLODipine (NORVASC) 10 MG tablet Take 10 mg by mouth daily at 12 noon.  03/27/18   [provider]  aspirin EC 81 MG tablet Take 81 mg by mouth daily.    [provider]  atorvastatin (LIPITOR) 10 MG tablet Take 10 mg by mouth every evening.  03/29/19   [provider]  B Complex-C-Folic Acid (DIALYVITE TABLET) TABS Take 1 tablet by mouth daily.    [provider]  carvedilol (COREG) 25 MG tablet Take 25 mg by mouth 2 (two) times daily. 04/20/19   [provider]  diphenhydrAMINE (BENADRYL) 25 MG tablet Take 50 mg by mouth daily as needed for allergies.    [provider]  docusate sodium (COLACE) 100 MG capsule Take 100 mg by mouth daily as needed for mild constipation.     [provider]  famotidine (PEPCID) 20 MG tablet Take 20 mg by mouth daily as needed for heartburn or indigestion.    [provider]  gabapentin (NEURONTIN) 100 MG capsule Take 100-200 mg by mouth See admin instructions. Take 100 mg at night. Take 200 mg after dialysis on Tuesdays, Thursdays,and Saturdays 04/20/19   [provider]  glipiZIDE (GLUCOTROL) 5 MG tablet Take 5 mg by mouth daily.    [provider]  LANTUS SOLOSTAR 100 UNIT/ML Solostar Pen Inject 12 Units into the skin at bedtime.  03/18/19   [provider]  lidocaine-prilocaine (EMLA) cream Apply 1 application topically as needed (port access).    [provider]  lisinopril (ZESTRIL) 5 MG tablet Take 5 mg by mouth daily. 11/11/19   [provider]  nitroGLYCERIN (NITROSTAT) 0.4 MG SL tablet Place 0.4 mg under the tongue every 5 (five) minutes as needed for chest pain.    [provider]  ondansetron (ZOFRAN) 4 MG tablet Take 1 tablet (4 mg total) by mouth every 6 (six) hours. Patient taking differently: Take 4 mg by mouth every 8 (eight) hours  as needed for nausea or vomiting.  05/11/19   Alveria Apley, PA-C  sevelamer carbonate (RENVELA) 800 MG tablet Take 800-1,600 mg by mouth See admin instructions. Take 1600 mg with each meal and 800 mg with each snack 05/12/19   [provider]  Simethicone (GAS-X PO) Take 1 tablet by mouth daily as needed (gas).    [provider]  sodium chloride (OCEAN) 0.65 % SOLN nasal spray Place 1 spray into both nostrils in the morning and at bedtime.    [provider]  tiZANidine (ZANAFLEX) 4 MG tablet Take 4 mg by mouth every 8 (eight) hours.  04/20/19   [provider]    Allergies    Benzonatate, Hydralazine, Amoxicillin, Clonidine, Hydrocodone, Morphine, and Oxycodone  Review of Systems   Review of Systems  All other systems reviewed and are negative.   Physical Exam Updated Vital Signs BP (!) 215/96 (BP Location: Left Arm)   Pulse (!) 55   Temp 98.7 F (37.1 C) (Oral)   Resp (!) 24   Ht 5\' 5"  (1.651 m)   Wt (!) 163.3 kg   SpO2 100%   BMI 59.91 kg/m   Physical Exam Vitals and nursing note reviewed.  Constitutional:      Appearance: She is well-developed.  HENT:     Head: Normocephalic.  Cardiovascular:     Rate and Rhythm: Normal rate.  Pulmonary:     Effort: Pulmonary effort is normal.  Abdominal:     General: There is no distension.  Musculoskeletal:        General: Normal range of motion.     Cervical back: Normal range of motion.  Skin:    General: Skin is warm.  Neurological:     Mental Status: She is alert and oriented to person, place, and time.  Psychiatric:        Mood and Affect: Mood normal.     ED Results / Procedures / Treatments   Labs (all labs ordered are listed, but only abnormal results are displayed) Labs Reviewed  CBC WITH DIFFERENTIAL/PLATELET  COMPREHENSIVE METABOLIC PANEL  CBG MONITORING, ED    EKG None  Radiology No results found.  Procedures Procedures (including critical care  time)  Medications Ordered in ED Medications - No data to display  ED Course  I have reviewed the triage vital signs and the nursing notes.  Pertinent labs & imaging results that were available during my care of the patient were reviewed by me and considered in my medical decision making (see chart for details).    MDM Rules/Calculators/A&P                          Pt's care turned over to Dr. Laverta Baltimore for reevaluation  Final Clinical Impression(s) / ED  Diagnoses Final diagnoses:  Hypertension, unspecified type  Leg cramps    Rx / DC Orders ED Discharge Orders    None       Sidney Ace 01/24/20 1843    Margette Fast, MD 01/24/20 [phone]

## 2020-01-24 NOTE — ED Triage Notes (Signed)
Pt arrives EMS, coming from Dialysis center.  Reports that pt took all home BP medications today, Amlodipine 10mg  at 1330 and Lisinopril at 1130.  Pt had full course of dialysis today at Saturday, pt still remains hypertensive.  EMS BP was 230/126.  Pt is alert and oriented, all other vitals remain stable.

## 2020-01-25 DIAGNOSIS — I10 Essential (primary) hypertension: Secondary | ICD-10-CM | POA: Diagnosis not present

## 2020-01-25 MED ORDER — ASPIRIN EC 81 MG PO TBEC
81.0000 mg | DELAYED_RELEASE_TABLET | Freq: Once | ORAL | Status: AC
  Administered 2020-01-25: 81 mg via ORAL
  Filled 2020-01-25: qty 1

## 2020-01-25 MED ORDER — ONDANSETRON 4 MG PO TBDP
4.0000 mg | ORAL_TABLET | Freq: Once | ORAL | Status: AC
  Administered 2020-01-25: 4 mg via ORAL
  Filled 2020-01-25: qty 1

## 2020-01-25 MED ORDER — GABAPENTIN 100 MG PO CAPS
200.0000 mg | ORAL_CAPSULE | Freq: Once | ORAL | Status: AC
  Administered 2020-01-25: 200 mg via ORAL
  Filled 2020-01-25: qty 2

## 2020-01-25 MED ORDER — LISINOPRIL 5 MG PO TABS
5.0000 mg | ORAL_TABLET | Freq: Every day | ORAL | Status: DC
  Administered 2020-01-25: 5 mg via ORAL
  Filled 2020-01-25 (×2): qty 1

## 2020-01-25 MED ORDER — ATORVASTATIN CALCIUM 10 MG PO TABS
10.0000 mg | ORAL_TABLET | Freq: Every day | ORAL | Status: DC
  Administered 2020-01-25: 10 mg via ORAL
  Filled 2020-01-25 (×2): qty 1

## 2020-01-25 MED ORDER — INSULIN GLARGINE 100 UNIT/ML ~~LOC~~ SOLN
12.0000 [IU] | Freq: Every day | SUBCUTANEOUS | Status: DC
  Administered 2020-01-25: 12 [IU] via SUBCUTANEOUS
  Filled 2020-01-25 (×2): qty 0.12

## 2020-01-25 MED ORDER — CARVEDILOL 12.5 MG PO TABS
25.0000 mg | ORAL_TABLET | Freq: Once | ORAL | Status: DC
  Filled 2020-01-25: qty 2

## 2020-01-25 MED ORDER — TIZANIDINE HCL 4 MG PO TABS
4.0000 mg | ORAL_TABLET | Freq: Once | ORAL | Status: AC
  Administered 2020-01-25: 4 mg via ORAL
  Filled 2020-01-25: qty 1

## 2020-01-25 NOTE — Clinical Social Work Note (Signed)
Transition of Care [facility]) - Emergency Department Mini Assessment  Patient Details  Name: Courtney Gray MRN: [id number] Date of Birth: [birth year]  Transition of Care [facility] CM/SW Contact:    Sherie Don, LCSW Phone Number: 01/25/2020, 9:21 AM  Clinical Narrative: Patient is a 49 year old female who presented to the ED for hypertension. CSW received call from RN that the patient was discharged yesterday evening, but her transportation through [employer] with her insurance benefits did not arrive to transport her home. CSW called [phone] to follow up with the status of the transportation reservation [phone]. Per [employer], the patient is scheduled to be picked up today, but they are awaiting driver availability so [employer] [facility] will contact CSW or the ED secretary when they have an ETA.   ED Mini Assessment: What brought you to the Emergency Department? 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 Acute respiratory failure with hypoxia ([place]) 11/28/2019  . Fluid overload 11/28/2019  . Hypertensive urgency 11/28/2019  . Type 2 diabetes mellitus with other specified complication ([place]) 56/38/9373  . Anemia of chronic disease 11/28/2019  . Morbid obesity ([place]) 11/28/2019   PCP:  Monico Blitz, MD Pharmacy:   [facility],  [place] [address] [place] [address] Phone: [phone] Fax: [phone]

## 2020-01-25 NOTE — ED Notes (Signed)
Pt assisted to bathroom and back to chair

## 2020-01-25 NOTE — ED Notes (Signed)
Pt informed that [facility] does not have transportation after midnight and she would need to wait til 7am; pt verbalized understanding

## 2020-01-25 NOTE — ED Notes (Signed)
Spoke with Nicole Kindred from [facility] reports that pt does not have an assigned driver to pick her up.  He will reach out to Supervisor to escalate for a driver.

## 2020-01-25 NOTE — ED Notes (Signed)
Pt requesting her night time meds; Dr. Tomi Bamberger informed and order given to administer her medications

## 2020-01-26 DIAGNOSIS — Z992 Dependence on renal dialysis: Secondary | ICD-10-CM | POA: Diagnosis not present

## 2020-01-26 DIAGNOSIS — E8770 Fluid overload, unspecified: Secondary | ICD-10-CM | POA: Diagnosis not present

## 2020-01-26 DIAGNOSIS — N2581 Secondary hyperparathyroidism of renal origin: Secondary | ICD-10-CM | POA: Diagnosis not present

## 2020-01-26 DIAGNOSIS — N186 End stage renal disease: Secondary | ICD-10-CM | POA: Diagnosis not present

## 2020-01-27 DIAGNOSIS — J9601 Acute respiratory failure with hypoxia: Secondary | ICD-10-CM | POA: Diagnosis not present

## 2020-01-28 DIAGNOSIS — N186 End stage renal disease: Secondary | ICD-10-CM | POA: Diagnosis not present

## 2020-01-28 DIAGNOSIS — Z992 Dependence on renal dialysis: Secondary | ICD-10-CM | POA: Diagnosis not present

## 2020-01-28 DIAGNOSIS — E8770 Fluid overload, unspecified: Secondary | ICD-10-CM | POA: Diagnosis not present

## 2020-01-28 DIAGNOSIS — N2581 Secondary hyperparathyroidism of renal origin: Secondary | ICD-10-CM | POA: Diagnosis not present

## 2020-01-29 DIAGNOSIS — J9601 Acute respiratory failure with hypoxia: Secondary | ICD-10-CM | POA: Diagnosis not present

## 2020-01-31 DIAGNOSIS — E8770 Fluid overload, unspecified: Secondary | ICD-10-CM | POA: Diagnosis not present

## 2020-01-31 DIAGNOSIS — Z992 Dependence on renal dialysis: Secondary | ICD-10-CM | POA: Diagnosis not present

## 2020-01-31 DIAGNOSIS — N186 End stage renal disease: Secondary | ICD-10-CM | POA: Diagnosis not present

## 2020-01-31 DIAGNOSIS — N2581 Secondary hyperparathyroidism of renal origin: Secondary | ICD-10-CM | POA: Diagnosis not present

## 2020-02-02 DIAGNOSIS — N186 End stage renal disease: Secondary | ICD-10-CM | POA: Diagnosis not present

## 2020-02-02 DIAGNOSIS — N2581 Secondary hyperparathyroidism of renal origin: Secondary | ICD-10-CM | POA: Diagnosis not present

## 2020-02-02 DIAGNOSIS — E8770 Fluid overload, unspecified: Secondary | ICD-10-CM | POA: Diagnosis not present

## 2020-02-02 DIAGNOSIS — Z992 Dependence on renal dialysis: Secondary | ICD-10-CM | POA: Diagnosis not present

## 2020-02-03 DIAGNOSIS — I10 Essential (primary) hypertension: Secondary | ICD-10-CM | POA: Diagnosis not present

## 2020-02-03 DIAGNOSIS — E785 Hyperlipidemia, unspecified: Secondary | ICD-10-CM | POA: Diagnosis not present

## 2020-02-04 DIAGNOSIS — E8770 Fluid overload, unspecified: Secondary | ICD-10-CM | POA: Diagnosis not present

## 2020-02-04 DIAGNOSIS — N186 End stage renal disease: Secondary | ICD-10-CM | POA: Diagnosis not present

## 2020-02-04 DIAGNOSIS — Z992 Dependence on renal dialysis: Secondary | ICD-10-CM | POA: Diagnosis not present

## 2020-02-04 DIAGNOSIS — N2581 Secondary hyperparathyroidism of renal origin: Secondary | ICD-10-CM | POA: Diagnosis not present

## 2020-02-05 DIAGNOSIS — N186 End stage renal disease: Secondary | ICD-10-CM | POA: Diagnosis not present

## 2020-02-05 DIAGNOSIS — Z992 Dependence on renal dialysis: Secondary | ICD-10-CM | POA: Diagnosis not present

## 2020-02-07 DIAGNOSIS — N186 End stage renal disease: Secondary | ICD-10-CM | POA: Diagnosis not present

## 2020-02-07 DIAGNOSIS — E8779 Other fluid overload: Secondary | ICD-10-CM | POA: Diagnosis not present

## 2020-02-07 DIAGNOSIS — N2581 Secondary hyperparathyroidism of renal origin: Secondary | ICD-10-CM | POA: Diagnosis not present

## 2020-02-07 DIAGNOSIS — E8770 Fluid overload, unspecified: Secondary | ICD-10-CM | POA: Diagnosis not present

## 2020-02-07 DIAGNOSIS — Z992 Dependence on renal dialysis: Secondary | ICD-10-CM | POA: Diagnosis not present

## 2020-02-08 DIAGNOSIS — K3184 Gastroparesis: Secondary | ICD-10-CM | POA: Diagnosis not present

## 2020-02-08 DIAGNOSIS — R0602 Shortness of breath: Secondary | ICD-10-CM | POA: Diagnosis not present

## 2020-02-08 DIAGNOSIS — K582 Mixed irritable bowel syndrome: Secondary | ICD-10-CM | POA: Diagnosis not present

## 2020-02-08 DIAGNOSIS — I10 Essential (primary) hypertension: Secondary | ICD-10-CM | POA: Diagnosis not present

## 2020-02-08 DIAGNOSIS — Z2821 Immunization not carried out because of patient refusal: Secondary | ICD-10-CM | POA: Diagnosis not present

## 2020-02-08 DIAGNOSIS — E1122 Type 2 diabetes mellitus with diabetic chronic kidney disease: Secondary | ICD-10-CM | POA: Diagnosis not present

## 2020-02-08 DIAGNOSIS — Z299 Encounter for prophylactic measures, unspecified: Secondary | ICD-10-CM | POA: Diagnosis not present

## 2020-02-09 DIAGNOSIS — N2581 Secondary hyperparathyroidism of renal origin: Secondary | ICD-10-CM | POA: Diagnosis not present

## 2020-02-09 DIAGNOSIS — E8779 Other fluid overload: Secondary | ICD-10-CM | POA: Diagnosis not present

## 2020-02-09 DIAGNOSIS — E8770 Fluid overload, unspecified: Secondary | ICD-10-CM | POA: Diagnosis not present

## 2020-02-09 DIAGNOSIS — N186 End stage renal disease: Secondary | ICD-10-CM | POA: Diagnosis not present

## 2020-02-09 DIAGNOSIS — Z992 Dependence on renal dialysis: Secondary | ICD-10-CM | POA: Diagnosis not present

## 2020-02-11 DIAGNOSIS — Z992 Dependence on renal dialysis: Secondary | ICD-10-CM | POA: Diagnosis not present

## 2020-02-11 DIAGNOSIS — E8779 Other fluid overload: Secondary | ICD-10-CM | POA: Diagnosis not present

## 2020-02-11 DIAGNOSIS — N186 End stage renal disease: Secondary | ICD-10-CM | POA: Diagnosis not present

## 2020-02-11 DIAGNOSIS — N2581 Secondary hyperparathyroidism of renal origin: Secondary | ICD-10-CM | POA: Diagnosis not present

## 2020-02-11 DIAGNOSIS — E8770 Fluid overload, unspecified: Secondary | ICD-10-CM | POA: Diagnosis not present

## 2020-02-14 DIAGNOSIS — E8779 Other fluid overload: Secondary | ICD-10-CM | POA: Diagnosis not present

## 2020-02-14 DIAGNOSIS — Z992 Dependence on renal dialysis: Secondary | ICD-10-CM | POA: Diagnosis not present

## 2020-02-14 DIAGNOSIS — N186 End stage renal disease: Secondary | ICD-10-CM | POA: Diagnosis not present

## 2020-02-14 DIAGNOSIS — E8770 Fluid overload, unspecified: Secondary | ICD-10-CM | POA: Diagnosis not present

## 2020-02-14 DIAGNOSIS — N2581 Secondary hyperparathyroidism of renal origin: Secondary | ICD-10-CM | POA: Diagnosis not present

## 2020-02-16 DIAGNOSIS — Z992 Dependence on renal dialysis: Secondary | ICD-10-CM | POA: Diagnosis not present

## 2020-02-16 DIAGNOSIS — E8779 Other fluid overload: Secondary | ICD-10-CM | POA: Diagnosis not present

## 2020-02-16 DIAGNOSIS — N186 End stage renal disease: Secondary | ICD-10-CM | POA: Diagnosis not present

## 2020-02-16 DIAGNOSIS — E8770 Fluid overload, unspecified: Secondary | ICD-10-CM | POA: Diagnosis not present

## 2020-02-16 DIAGNOSIS — N2581 Secondary hyperparathyroidism of renal origin: Secondary | ICD-10-CM | POA: Diagnosis not present

## 2020-02-18 DIAGNOSIS — E8779 Other fluid overload: Secondary | ICD-10-CM | POA: Diagnosis not present

## 2020-02-18 DIAGNOSIS — N186 End stage renal disease: Secondary | ICD-10-CM | POA: Diagnosis not present

## 2020-02-18 DIAGNOSIS — Z992 Dependence on renal dialysis: Secondary | ICD-10-CM | POA: Diagnosis not present

## 2020-02-18 DIAGNOSIS — N2581 Secondary hyperparathyroidism of renal origin: Secondary | ICD-10-CM | POA: Diagnosis not present

## 2020-02-18 DIAGNOSIS — E8770 Fluid overload, unspecified: Secondary | ICD-10-CM | POA: Diagnosis not present

## 2020-02-20 DIAGNOSIS — E8779 Other fluid overload: Secondary | ICD-10-CM | POA: Diagnosis not present

## 2020-02-20 DIAGNOSIS — N186 End stage renal disease: Secondary | ICD-10-CM | POA: Diagnosis not present

## 2020-02-20 DIAGNOSIS — Z992 Dependence on renal dialysis: Secondary | ICD-10-CM | POA: Diagnosis not present

## 2020-02-20 DIAGNOSIS — N2581 Secondary hyperparathyroidism of renal origin: Secondary | ICD-10-CM | POA: Diagnosis not present

## 2020-02-20 DIAGNOSIS — E8770 Fluid overload, unspecified: Secondary | ICD-10-CM | POA: Diagnosis not present

## 2020-02-21 DIAGNOSIS — N2581 Secondary hyperparathyroidism of renal origin: Secondary | ICD-10-CM | POA: Diagnosis not present

## 2020-02-21 DIAGNOSIS — N186 End stage renal disease: Secondary | ICD-10-CM | POA: Diagnosis not present

## 2020-02-21 DIAGNOSIS — Z992 Dependence on renal dialysis: Secondary | ICD-10-CM | POA: Diagnosis not present

## 2020-02-21 DIAGNOSIS — E8779 Other fluid overload: Secondary | ICD-10-CM | POA: Diagnosis not present

## 2020-02-21 DIAGNOSIS — E8770 Fluid overload, unspecified: Secondary | ICD-10-CM | POA: Diagnosis not present

## 2020-02-23 DIAGNOSIS — E8779 Other fluid overload: Secondary | ICD-10-CM | POA: Diagnosis not present

## 2020-02-23 DIAGNOSIS — Z992 Dependence on renal dialysis: Secondary | ICD-10-CM | POA: Diagnosis not present

## 2020-02-23 DIAGNOSIS — N2581 Secondary hyperparathyroidism of renal origin: Secondary | ICD-10-CM | POA: Diagnosis not present

## 2020-02-23 DIAGNOSIS — N186 End stage renal disease: Secondary | ICD-10-CM | POA: Diagnosis not present

## 2020-02-23 DIAGNOSIS — E8770 Fluid overload, unspecified: Secondary | ICD-10-CM | POA: Diagnosis not present

## 2020-02-25 DIAGNOSIS — E8779 Other fluid overload: Secondary | ICD-10-CM | POA: Diagnosis not present

## 2020-02-25 DIAGNOSIS — E8770 Fluid overload, unspecified: Secondary | ICD-10-CM | POA: Diagnosis not present

## 2020-02-25 DIAGNOSIS — N2581 Secondary hyperparathyroidism of renal origin: Secondary | ICD-10-CM | POA: Diagnosis not present

## 2020-02-25 DIAGNOSIS — N186 End stage renal disease: Secondary | ICD-10-CM | POA: Diagnosis not present

## 2020-02-25 DIAGNOSIS — Z992 Dependence on renal dialysis: Secondary | ICD-10-CM | POA: Diagnosis not present

## 2020-02-27 DIAGNOSIS — J9601 Acute respiratory failure with hypoxia: Secondary | ICD-10-CM | POA: Diagnosis not present

## 2020-02-28 DIAGNOSIS — E8770 Fluid overload, unspecified: Secondary | ICD-10-CM | POA: Diagnosis not present

## 2020-02-28 DIAGNOSIS — E8779 Other fluid overload: Secondary | ICD-10-CM | POA: Diagnosis not present

## 2020-02-28 DIAGNOSIS — N186 End stage renal disease: Secondary | ICD-10-CM | POA: Diagnosis not present

## 2020-02-28 DIAGNOSIS — N2581 Secondary hyperparathyroidism of renal origin: Secondary | ICD-10-CM | POA: Diagnosis not present

## 2020-02-28 DIAGNOSIS — Z992 Dependence on renal dialysis: Secondary | ICD-10-CM | POA: Diagnosis not present

## 2020-02-29 DIAGNOSIS — E8770 Fluid overload, unspecified: Secondary | ICD-10-CM | POA: Diagnosis not present

## 2020-02-29 DIAGNOSIS — Z992 Dependence on renal dialysis: Secondary | ICD-10-CM | POA: Diagnosis not present

## 2020-02-29 DIAGNOSIS — N186 End stage renal disease: Secondary | ICD-10-CM | POA: Diagnosis not present

## 2020-02-29 DIAGNOSIS — E8779 Other fluid overload: Secondary | ICD-10-CM | POA: Diagnosis not present

## 2020-02-29 DIAGNOSIS — N2581 Secondary hyperparathyroidism of renal origin: Secondary | ICD-10-CM | POA: Diagnosis not present

## 2020-03-03 DIAGNOSIS — N186 End stage renal disease: Secondary | ICD-10-CM | POA: Diagnosis not present

## 2020-03-03 DIAGNOSIS — E8779 Other fluid overload: Secondary | ICD-10-CM | POA: Diagnosis not present

## 2020-03-03 DIAGNOSIS — E8770 Fluid overload, unspecified: Secondary | ICD-10-CM | POA: Diagnosis not present

## 2020-03-03 DIAGNOSIS — N2581 Secondary hyperparathyroidism of renal origin: Secondary | ICD-10-CM | POA: Diagnosis not present

## 2020-03-03 DIAGNOSIS — Z992 Dependence on renal dialysis: Secondary | ICD-10-CM | POA: Diagnosis not present

## 2020-03-06 DIAGNOSIS — N2581 Secondary hyperparathyroidism of renal origin: Secondary | ICD-10-CM | POA: Diagnosis not present

## 2020-03-06 DIAGNOSIS — E785 Hyperlipidemia, unspecified: Secondary | ICD-10-CM | POA: Diagnosis not present

## 2020-03-06 DIAGNOSIS — Z992 Dependence on renal dialysis: Secondary | ICD-10-CM | POA: Diagnosis not present

## 2020-03-06 DIAGNOSIS — E8779 Other fluid overload: Secondary | ICD-10-CM | POA: Diagnosis not present

## 2020-03-06 DIAGNOSIS — I10 Essential (primary) hypertension: Secondary | ICD-10-CM | POA: Diagnosis not present

## 2020-03-06 DIAGNOSIS — E8770 Fluid overload, unspecified: Secondary | ICD-10-CM | POA: Diagnosis not present

## 2020-03-06 DIAGNOSIS — N186 End stage renal disease: Secondary | ICD-10-CM | POA: Diagnosis not present

## 2020-03-08 DIAGNOSIS — Z992 Dependence on renal dialysis: Secondary | ICD-10-CM | POA: Diagnosis not present

## 2020-03-08 DIAGNOSIS — N186 End stage renal disease: Secondary | ICD-10-CM | POA: Diagnosis not present

## 2020-03-08 DIAGNOSIS — E8770 Fluid overload, unspecified: Secondary | ICD-10-CM | POA: Diagnosis not present

## 2020-03-10 DIAGNOSIS — E8770 Fluid overload, unspecified: Secondary | ICD-10-CM | POA: Diagnosis not present

## 2020-03-10 DIAGNOSIS — Z992 Dependence on renal dialysis: Secondary | ICD-10-CM | POA: Diagnosis not present

## 2020-03-10 DIAGNOSIS — N186 End stage renal disease: Secondary | ICD-10-CM | POA: Diagnosis not present

## 2020-03-13 DIAGNOSIS — N186 End stage renal disease: Secondary | ICD-10-CM | POA: Diagnosis not present

## 2020-03-13 DIAGNOSIS — E8770 Fluid overload, unspecified: Secondary | ICD-10-CM | POA: Diagnosis not present

## 2020-03-13 DIAGNOSIS — Z992 Dependence on renal dialysis: Secondary | ICD-10-CM | POA: Diagnosis not present

## 2020-03-15 DIAGNOSIS — E8770 Fluid overload, unspecified: Secondary | ICD-10-CM | POA: Diagnosis not present

## 2020-03-15 DIAGNOSIS — Z992 Dependence on renal dialysis: Secondary | ICD-10-CM | POA: Diagnosis not present

## 2020-03-15 DIAGNOSIS — N186 End stage renal disease: Secondary | ICD-10-CM | POA: Diagnosis not present

## 2020-03-17 DIAGNOSIS — E8770 Fluid overload, unspecified: Secondary | ICD-10-CM | POA: Diagnosis not present

## 2020-03-17 DIAGNOSIS — Z992 Dependence on renal dialysis: Secondary | ICD-10-CM | POA: Diagnosis not present

## 2020-03-17 DIAGNOSIS — N186 End stage renal disease: Secondary | ICD-10-CM | POA: Diagnosis not present

## 2020-03-20 DIAGNOSIS — N2581 Secondary hyperparathyroidism of renal origin: Secondary | ICD-10-CM | POA: Diagnosis not present

## 2020-03-20 DIAGNOSIS — E8770 Fluid overload, unspecified: Secondary | ICD-10-CM | POA: Diagnosis not present

## 2020-03-20 DIAGNOSIS — Z992 Dependence on renal dialysis: Secondary | ICD-10-CM | POA: Diagnosis not present

## 2020-03-20 DIAGNOSIS — N186 End stage renal disease: Secondary | ICD-10-CM | POA: Diagnosis not present

## 2020-03-22 DIAGNOSIS — Z992 Dependence on renal dialysis: Secondary | ICD-10-CM | POA: Diagnosis not present

## 2020-03-22 DIAGNOSIS — N186 End stage renal disease: Secondary | ICD-10-CM | POA: Diagnosis not present

## 2020-03-22 DIAGNOSIS — E8770 Fluid overload, unspecified: Secondary | ICD-10-CM | POA: Diagnosis not present

## 2020-03-24 DIAGNOSIS — E8770 Fluid overload, unspecified: Secondary | ICD-10-CM | POA: Diagnosis not present

## 2020-03-24 DIAGNOSIS — Z992 Dependence on renal dialysis: Secondary | ICD-10-CM | POA: Diagnosis not present

## 2020-03-24 DIAGNOSIS — N186 End stage renal disease: Secondary | ICD-10-CM | POA: Diagnosis not present

## 2020-03-27 DIAGNOSIS — E8770 Fluid overload, unspecified: Secondary | ICD-10-CM | POA: Diagnosis not present

## 2020-03-27 DIAGNOSIS — N186 End stage renal disease: Secondary | ICD-10-CM | POA: Diagnosis not present

## 2020-03-27 DIAGNOSIS — Z992 Dependence on renal dialysis: Secondary | ICD-10-CM | POA: Diagnosis not present

## 2020-03-29 DIAGNOSIS — N186 End stage renal disease: Secondary | ICD-10-CM | POA: Diagnosis not present

## 2020-03-29 DIAGNOSIS — E8770 Fluid overload, unspecified: Secondary | ICD-10-CM | POA: Diagnosis not present

## 2020-03-29 DIAGNOSIS — Z992 Dependence on renal dialysis: Secondary | ICD-10-CM | POA: Diagnosis not present

## 2020-03-30 DIAGNOSIS — E8770 Fluid overload, unspecified: Secondary | ICD-10-CM | POA: Diagnosis not present

## 2020-03-30 DIAGNOSIS — N186 End stage renal disease: Secondary | ICD-10-CM | POA: Diagnosis not present

## 2020-03-30 DIAGNOSIS — Z992 Dependence on renal dialysis: Secondary | ICD-10-CM | POA: Diagnosis not present

## 2020-04-02 DIAGNOSIS — E8770 Fluid overload, unspecified: Secondary | ICD-10-CM | POA: Diagnosis not present

## 2020-04-02 DIAGNOSIS — N186 End stage renal disease: Secondary | ICD-10-CM | POA: Diagnosis not present

## 2020-04-02 DIAGNOSIS — Z992 Dependence on renal dialysis: Secondary | ICD-10-CM | POA: Diagnosis not present

## 2020-04-05 DIAGNOSIS — E8770 Fluid overload, unspecified: Secondary | ICD-10-CM | POA: Diagnosis not present

## 2020-04-05 DIAGNOSIS — N186 End stage renal disease: Secondary | ICD-10-CM | POA: Diagnosis not present

## 2020-04-05 DIAGNOSIS — Z992 Dependence on renal dialysis: Secondary | ICD-10-CM | POA: Diagnosis not present

## 2020-04-06 DIAGNOSIS — N186 End stage renal disease: Secondary | ICD-10-CM | POA: Diagnosis not present

## 2020-04-06 DIAGNOSIS — Z992 Dependence on renal dialysis: Secondary | ICD-10-CM | POA: Diagnosis not present

## 2020-04-07 DIAGNOSIS — N186 End stage renal disease: Secondary | ICD-10-CM | POA: Diagnosis not present

## 2020-04-07 DIAGNOSIS — J9601 Acute respiratory failure with hypoxia: Secondary | ICD-10-CM | POA: Diagnosis not present

## 2020-04-07 DIAGNOSIS — Z992 Dependence on renal dialysis: Secondary | ICD-10-CM | POA: Diagnosis not present

## 2020-04-08 ENCOUNTER — Emergency Department (HOSPITAL_COMMUNITY)
Admission: EM | Admit: 2020-04-08 | Discharge: 2020-04-08 | Disposition: A | Discharge status: Home / Self Care | Payer: Medicare Other | Attending: Emergency Medicine | Admitting: Emergency Medicine

## 2020-04-08 ENCOUNTER — Other Ambulatory Visit: Payer: Self-pay

## 2020-04-08 ENCOUNTER — Encounter (HOSPITAL_COMMUNITY): Payer: Self-pay | Admitting: Emergency Medicine

## 2020-04-08 ENCOUNTER — Emergency Department (HOSPITAL_COMMUNITY): Payer: Medicare Other

## 2020-04-08 DIAGNOSIS — R069 Unspecified abnormalities of breathing: Secondary | ICD-10-CM | POA: Diagnosis not present

## 2020-04-08 DIAGNOSIS — Z79899 Other long term (current) drug therapy: Secondary | ICD-10-CM | POA: Insufficient documentation

## 2020-04-08 DIAGNOSIS — Z20822 Contact with and (suspected) exposure to covid-19: Secondary | ICD-10-CM | POA: Diagnosis not present

## 2020-04-08 DIAGNOSIS — Z794 Long term (current) use of insulin: Secondary | ICD-10-CM | POA: Diagnosis not present

## 2020-04-08 DIAGNOSIS — R059 Cough, unspecified: Secondary | ICD-10-CM | POA: Diagnosis not present

## 2020-04-08 DIAGNOSIS — I10 Essential (primary) hypertension: Secondary | ICD-10-CM | POA: Diagnosis not present

## 2020-04-08 DIAGNOSIS — Z7984 Long term (current) use of oral hypoglycemic drugs: Secondary | ICD-10-CM | POA: Diagnosis not present

## 2020-04-08 DIAGNOSIS — R0602 Shortness of breath: Secondary | ICD-10-CM | POA: Insufficient documentation

## 2020-04-08 DIAGNOSIS — R1110 Vomiting, unspecified: Secondary | ICD-10-CM | POA: Diagnosis not present

## 2020-04-08 DIAGNOSIS — I700 Atherosclerosis of aorta: Secondary | ICD-10-CM | POA: Diagnosis not present

## 2020-04-08 DIAGNOSIS — E119 Type 2 diabetes mellitus without complications: Secondary | ICD-10-CM | POA: Insufficient documentation

## 2020-04-08 DIAGNOSIS — Z7982 Long term (current) use of aspirin: Secondary | ICD-10-CM | POA: Insufficient documentation

## 2020-04-08 DIAGNOSIS — R1084 Generalized abdominal pain: Secondary | ICD-10-CM | POA: Diagnosis not present

## 2020-04-08 DIAGNOSIS — G4489 Other headache syndrome: Secondary | ICD-10-CM | POA: Diagnosis not present

## 2020-04-08 LAB — CBC WITH DIFFERENTIAL/PLATELET
Abs Immature Granulocytes: 0.06 10*3/uL (ref 0.00–0.07)
Basophils Absolute: 0.1 10*3/uL (ref 0.0–0.1)
Basophils Relative: 1 %
Eosinophils Absolute: 0.3 10*3/uL (ref 0.0–0.5)
Eosinophils Relative: 3 %
HCT: 37.9 % (ref 36.0–46.0)
Hemoglobin: 11.7 g/dL — ABNORMAL LOW (ref 12.0–15.0)
Immature Granulocytes: 1 %
Lymphocytes Relative: 11 %
Lymphs Abs: 1.1 10*3/uL (ref 0.7–4.0)
MCH: 31.4 pg (ref 26.0–34.0)
MCHC: 30.9 g/dL (ref 30.0–36.0)
MCV: 101.6 fL — ABNORMAL HIGH (ref 80.0–100.0)
Monocytes Absolute: 0.9 10*3/uL (ref 0.1–1.0)
Monocytes Relative: 9 %
Neutro Abs: 7.9 10*3/uL — ABNORMAL HIGH (ref 1.7–7.7)
Neutrophils Relative %: 75 %
Platelets: 185 10*3/uL (ref 150–400)
RBC: 3.73 MIL/uL — ABNORMAL LOW (ref 3.87–5.11)
RDW: 14.8 % (ref 11.5–15.5)
WBC: 10.3 10*3/uL (ref 4.0–10.5)
nRBC: 0.2 % (ref 0.0–0.2)

## 2020-04-08 LAB — HEPATIC FUNCTION PANEL
ALT: 14 U/L (ref 0–44)
AST: 13 U/L — ABNORMAL LOW (ref 15–41)
Albumin: 4.1 g/dL (ref 3.5–5.0)
Alkaline Phosphatase: 73 U/L (ref 38–126)
Bilirubin, Direct: 0.1 mg/dL (ref 0.0–0.2)
Indirect Bilirubin: 0.6 mg/dL (ref 0.3–0.9)
Total Bilirubin: 0.7 mg/dL (ref 0.3–1.2)
Total Protein: 8.2 g/dL — ABNORMAL HIGH (ref 6.5–8.1)

## 2020-04-08 LAB — BASIC METABOLIC PANEL
Anion gap: 10 (ref 5–15)
BUN: 31 mg/dL — ABNORMAL HIGH (ref 6–20)
CO2: 26 mmol/L (ref 22–32)
Calcium: 9.1 mg/dL (ref 8.9–10.3)
Chloride: 98 mmol/L (ref 98–111)
Creatinine, Ser: 10.4 mg/dL — ABNORMAL HIGH (ref 0.44–1.00)
GFR, Estimated: 4 mL/min — ABNORMAL LOW (ref >60)
Glucose, Bld: 73 mg/dL (ref 70–99)
Potassium: 4.1 mmol/L (ref 3.5–5.1)
Sodium: 134 mmol/L — ABNORMAL LOW (ref 135–145)

## 2020-04-08 LAB — RESP PANEL BY RT-PCR (FLU A&B, COVID) ARPGX2
Influenza A by PCR: NEGATIVE
Influenza B by PCR: NEGATIVE
SARS Coronavirus 2 by RT PCR: NEGATIVE

## 2020-04-08 LAB — LIPASE, BLOOD: Lipase: 27 U/L (ref 11–51)

## 2020-04-08 MED ORDER — DOXYCYCLINE HYCLATE 100 MG PO CAPS: 100.0000 mg | ORAL_CAPSULE | Freq: Two times a day (BID) | ORAL | 0 refills | Status: DC

## 2020-04-08 MED ORDER — LEVOFLOXACIN 500 MG PO TABS: 500.0000 mg | ORAL_TABLET | Freq: Once | ORAL | Status: DC

## 2020-04-08 MED ORDER — ONDANSETRON 4 MG PO TBDP
4.0000 mg | ORAL_TABLET | Freq: Once | ORAL | Status: DC
  Filled 2020-04-08: qty 1

## 2020-04-08 MED ORDER — PROMETHAZINE HCL 25 MG RE SUPP: 25.0000 mg | Freq: Four times a day (QID) | RECTAL | 0 refills | Status: DC | PRN

## 2020-04-08 MED ORDER — PROMETHAZINE HCL 25 MG/ML IJ SOLN
12.5000 mg | Freq: Once | INTRAMUSCULAR | Status: AC
  Administered 2020-04-08: 12.5 mg via INTRAVENOUS
  Filled 2020-04-08: qty 1

## 2020-04-08 MED ORDER — DOXYCYCLINE HYCLATE 100 MG PO TABS
100.0000 mg | ORAL_TABLET | Freq: Once | ORAL | Status: AC
  Administered 2020-04-08: 100 mg via ORAL
  Filled 2020-04-08: qty 1

## 2020-04-08 NOTE — ED Provider Notes (Signed)
[facility] EMERGENCY DEPARTMENT Provider Note   CSN: [id number] Arrival date & time: 04/08/20  1018     History Chief Complaint  Patient presents with  . Shortness of Breath    Courtney Gray is a 50 y.o. female.  Patient with cough and vomiting.  Patient states that she vomits when she coughs.  This is been going on for a number days no fever no chills.  The history is provided by the patient.  Cough Cough characteristics:  Dry Sputum characteristics:  Nondescript Severity:  Moderate Onset quality:  Sudden Timing:  Constant Progression:  Waxing and waning Chronicity:  New Smoker: no   Context: not animal exposure   Relieved by:  Nothing Worsened by:  Nothing Ineffective treatments:  None tried Associated symptoms: no chest pain, no eye discharge, no headaches and no rash        Past Medical History:  Diagnosis Date  . Diabetes mellitus without complication ([place])   . High cholesterol   . Hypertension   . Renal disorder     Patient Active Problem List   Diagnosis Date Noted  . Acute respiratory failure with hypoxia ([place]) 11/28/2019  . Fluid overload 11/28/2019  . Hypertensive urgency 11/28/2019  . Type 2 diabetes mellitus with other specified complication ([place]) 95/63/8756  . Anemia of chronic disease 11/28/2019  . Morbid obesity ([place]) 11/28/2019    Past Surgical History:  Procedure Laterality Date  . AV FISTULA PLACEMENT     x2, unsuccessful.      OB History    Gravida  2   Para  2   Term  2   Preterm      AB      Living  2     SAB      IAB      Ectopic      Multiple      Live Births              Family History  Problem Relation Age of Onset  . Diabetes Mother   . Diabetes Father     Social History   Tobacco Use  . Smoking status: Never Smoker  . Smokeless tobacco: Never Used  Vaping Use  . Vaping Use: Never used  Substance Use Topics  . Alcohol use: Never  . Drug use: Never    Home Medications Prior to  Admission medications   Medication Sig Start Date End Date Taking? Authorizing Provider  doxycycline (VIBRAMYCIN) 100 MG capsule Take 1 capsule (100 mg total) by mouth 2 (two) times daily. One po bid x 7 days 04/08/20  Yes Milton Ferguson, MD  promethazine (PHENERGAN) 25 MG suppository Place 1 suppository (25 mg total) rectally every 6 (six) hours as needed for nausea or vomiting. 04/08/20  Yes Milton Ferguson, MD  acetaminophen (TYLENOL) 500 MG tablet Take 1,000 mg by mouth every 6 (six) hours as needed for moderate pain or headache.    [provider]  amLODipine (NORVASC) 10 MG tablet Take 10 mg by mouth daily at 12 noon.  03/27/18   [provider]  aspirin EC 81 MG tablet Take 81 mg by mouth daily.    [provider]  atorvastatin (LIPITOR) 10 MG tablet Take 10 mg by mouth every evening.  03/29/19   [provider]  carvedilol (COREG) 25 MG tablet Take 25 mg by mouth 2 (two) times daily. 04/20/19   [provider]  diphenhydrAMINE (BENADRYL) 25 MG tablet Take 50 mg by  mouth daily as needed for allergies.    [provider]  docusate sodium (COLACE) 100 MG capsule Take 100 mg by mouth daily as needed for mild constipation.     [provider]  famotidine (PEPCID) 20 MG tablet Take 20 mg by mouth daily as needed for heartburn or indigestion.    [provider]  gabapentin (NEURONTIN) 100 MG capsule Take 100-200 mg by mouth See admin instructions. Take 100 mg at night. Take 200 mg after dialysis on Tuesdays, Thursdays,and Saturdays 04/20/19   [provider]  glipiZIDE (GLUCOTROL) 5 MG tablet Take 5 mg by mouth daily.    [provider]  LANTUS SOLOSTAR 100 UNIT/ML Solostar Pen Inject 12 Units into the skin at bedtime.  03/18/19   [provider]  lidocaine-prilocaine (EMLA) cream Apply 1 application topically as needed (port access).    [provider]  lisinopril (ZESTRIL) 5 MG tablet Take 5 mg by  mouth daily. 11/11/19   [provider]  nitroGLYCERIN (NITROSTAT) 0.4 MG SL tablet Place 0.4 mg under the tongue every 5 (five) minutes as needed for chest pain.    [provider]  ondansetron (ZOFRAN) 4 MG tablet Take 1 tablet (4 mg total) by mouth every 6 (six) hours. Patient not taking: Reported on 01/24/2020 05/11/19   Madilyn Hook A, PA-C  sevelamer carbonate (RENVELA) 800 MG tablet Take 800-1,600 mg by mouth See admin instructions. Take 1600 mg with each meal and 800 mg with each snack Patient not taking: Reported on 01/24/2020 05/12/19   [provider]  Simethicone (GAS-X PO) Take 1 tablet by mouth daily as needed (gas).    [provider]  sodium chloride (OCEAN) 0.65 % SOLN nasal spray Place 1 spray into both nostrils in the morning and at bedtime.    [provider]  tiZANidine (ZANAFLEX) 4 MG tablet Take 4 mg by mouth every 8 (eight) hours.  04/20/19   [provider]    Allergies    Benzonatate, Hydralazine, Amoxicillin, Clonidine, Hydrocodone, Morphine, and Oxycodone  Review of Systems   Review of Systems  Constitutional: Negative for appetite change and fatigue.  HENT: Negative for congestion, ear discharge and sinus pressure.   Eyes: Negative for discharge.  Respiratory: Positive for cough.   Cardiovascular: Negative for chest pain.  Gastrointestinal: Negative for abdominal pain and diarrhea.  Genitourinary: Negative for frequency and hematuria.  Musculoskeletal: Negative for back pain.  Skin: Negative for rash.  Neurological: Negative for seizures and headaches.  Psychiatric/Behavioral: Negative for hallucinations.    Physical Exam Updated Vital Signs BP (!) 218/88   Pulse (!) 59   Temp 98.5 F (36.9 C) (Oral)   Resp 19   Ht 5\' 5"  (1.651 m)   Wt (!) 163.3 kg   SpO2 93%   BMI 59.91 kg/m   Physical Exam Vitals and nursing note reviewed.  Constitutional:      Appearance: She is well-developed.  HENT:      Head: Normocephalic.     Nose: Nose normal.  Eyes:     General: No scleral icterus.    Extraocular Movements: EOM normal.     Conjunctiva/sclera: Conjunctivae normal.  Neck:     Thyroid: No thyromegaly.  Cardiovascular:     Rate and Rhythm: Normal rate and regular rhythm.     Heart sounds: No murmur heard. No friction rub. No gallop.   Pulmonary:     Breath sounds: No stridor. No wheezing or rales.  Chest:  Chest wall: No tenderness.  Abdominal:     General: There is no distension.     Tenderness: There is no abdominal tenderness. There is no rebound.  Musculoskeletal:        General: No edema. Normal range of motion.     Cervical back: Neck supple.  Lymphadenopathy:     Cervical: No cervical adenopathy.  Skin:    Findings: No erythema or rash.  Neurological:     Mental Status: She is alert and oriented to person, place, and time.     Motor: No abnormal muscle tone.     Coordination: Coordination normal.  Psychiatric:        Mood and Affect: Mood and affect normal.        Behavior: Behavior normal.     ED Results / Procedures / Treatments   Labs (all labs ordered are listed, but only abnormal results are displayed) Labs Reviewed  CBC WITH DIFFERENTIAL/PLATELET - Abnormal; Notable for the following components:      Result Value   RBC 3.73 (*)    Hemoglobin 11.7 (*)    MCV 101.6 (*)    Neutro Abs 7.9 (*)    All other components within normal limits  BASIC METABOLIC PANEL - Abnormal; Notable for the following components:   Sodium 134 (*)    BUN 31 (*)    Creatinine, Ser 10.40 (*)    GFR, Estimated 4 (*)    All other components within normal limits  HEPATIC FUNCTION PANEL - Abnormal; Notable for the following components:   Total Protein 8.2 (*)    AST 13 (*)    All other components within normal limits  RESP PANEL BY RT-PCR (FLU A&B, COVID) ARPGX2  LIPASE, BLOOD    EKG None  Radiology CT ABDOMEN PELVIS WO CONTRAST  Result Date: 04/08/2020 CLINICAL  DATA:  Acute generalized abdominal pain. EXAM: CT ABDOMEN AND PELVIS WITHOUT CONTRAST TECHNIQUE: Multidetector CT imaging of the abdomen and pelvis was performed following the standard protocol without IV contrast. COMPARISON:  None. FINDINGS: Lower chest: No acute abnormality. Hepatobiliary: No focal liver abnormality is seen. No gallstones, gallbladder wall thickening, or biliary dilatation. Pancreas: Unremarkable. No pancreatic ductal dilatation or surrounding inflammatory changes. Spleen: Normal in size without focal abnormality. Adrenals/Urinary Tract: Adrenal glands appear normal. Severe bilateral renal atrophy is noted consistent with history of end-stage renal disease. No hydronephrosis or renal obstruction is noted. Urinary bladder is decompressed. Stomach/Bowel: Stomach is within normal limits. Appendix appears normal. No evidence of bowel wall thickening, distention, or inflammatory changes. Vascular/Lymphatic: Aortic atherosclerosis. No enlarged abdominal or pelvic lymph nodes. Reproductive: Uterus and bilateral adnexa are unremarkable. Other: No abdominal wall hernia or abnormality. No abdominopelvic ascites. Musculoskeletal: No acute or significant osseous findings. IMPRESSION: 1. Severe bilateral renal atrophy is noted consistent with history of end-stage renal disease. 2. No acute abnormality seen in the abdomen or pelvis. 3. Aortic atherosclerosis. Aortic Atherosclerosis (ICD10-I70.0). Electronically Signed   By: Marijo Conception M.D.   On: 04/08/2020 13:13    Procedures Procedures (including critical care time)  Medications Ordered in ED Medications  ondansetron (ZOFRAN-ODT) disintegrating tablet 4 mg (4 mg Oral Not Given 04/08/20 1229)  promethazine (PHENERGAN) injection 12.5 mg (has no administration in time range)  doxycycline (VIBRA-TABS) tablet 100 mg (has no administration in time range)    ED Course  I have reviewed the triage vital signs and the nursing notes.  Pertinent labs &  imaging results that were available during my  care of the patient were reviewed by me and considered in my medical decision making (see chart for details).    MDM Rules/Calculators/A&P                          Labs and CT unremarkable.  Patient does not have Covid.  Patient given nausea medicine and doxycycline for possible atypical infection.  And will follow up with her doctor Final Clinical Impression(s) / ED Diagnoses Final diagnoses:  Cough    Rx / DC Orders ED Discharge Orders         Ordered    promethazine (PHENERGAN) 25 MG suppository  Every 6 hours PRN        04/08/20 1450    doxycycline (VIBRAMYCIN) 100 MG capsule  2 times daily        04/08/20 1450           Milton Ferguson, MD 04/12/20 1045

## 2020-04-08 NOTE — ED Notes (Signed)
Pt to CT at this time.

## 2020-04-08 NOTE — Discharge Instructions (Addendum)
Follow-up with [facility] gastroenterology if your stomach does not improve.

## 2020-04-08 NOTE — ED Triage Notes (Signed)
Pt presented to via [facility] for SOB, x 1 week, decreased appetite, chills, cough and headache. Last dialysis was Thurs , (Tues, ' thurs' sat pt.) Afebrile. 4 lpm applied b/o= 138/82, 71, 100% 4 lpm, 99.1

## 2020-04-08 NOTE — ED Notes (Addendum)
Entered room and introduced self to patient. Pt appears to be resting in bed, respirations are even and unlabored with equal chest rise and fall. Bed is locked in the lowest position, side rails x2, call bell within reach. Pt educated on call light use and hourly rounding, verbalized understanding and in agreement at this time. All questions and concerns voiced addressed. Refreshments offered and provided per patient request. Cardiac monitor in place with vital signs cycling q30 minutes. Will continue to monitor.  

## 2020-04-09 DIAGNOSIS — Z992 Dependence on renal dialysis: Secondary | ICD-10-CM | POA: Diagnosis not present

## 2020-04-09 DIAGNOSIS — Z789 Other specified health status: Secondary | ICD-10-CM | POA: Diagnosis not present

## 2020-04-09 DIAGNOSIS — K529 Noninfective gastroenteritis and colitis, unspecified: Secondary | ICD-10-CM | POA: Diagnosis not present

## 2020-04-09 DIAGNOSIS — R059 Cough, unspecified: Secondary | ICD-10-CM | POA: Diagnosis not present

## 2020-04-09 DIAGNOSIS — N186 End stage renal disease: Secondary | ICD-10-CM | POA: Diagnosis not present

## 2020-04-09 DIAGNOSIS — Z299 Encounter for prophylactic measures, unspecified: Secondary | ICD-10-CM | POA: Diagnosis not present

## 2020-04-10 DIAGNOSIS — N2581 Secondary hyperparathyroidism of renal origin: Secondary | ICD-10-CM | POA: Diagnosis not present

## 2020-04-10 DIAGNOSIS — N186 End stage renal disease: Secondary | ICD-10-CM | POA: Diagnosis not present

## 2020-04-10 DIAGNOSIS — E119 Type 2 diabetes mellitus without complications: Secondary | ICD-10-CM | POA: Diagnosis not present

## 2020-04-10 DIAGNOSIS — D509 Iron deficiency anemia, unspecified: Secondary | ICD-10-CM | POA: Diagnosis not present

## 2020-04-10 DIAGNOSIS — Z992 Dependence on renal dialysis: Secondary | ICD-10-CM | POA: Diagnosis not present

## 2020-04-12 DIAGNOSIS — Z992 Dependence on renal dialysis: Secondary | ICD-10-CM | POA: Diagnosis not present

## 2020-04-12 DIAGNOSIS — N186 End stage renal disease: Secondary | ICD-10-CM | POA: Diagnosis not present

## 2020-04-14 DIAGNOSIS — Z992 Dependence on renal dialysis: Secondary | ICD-10-CM | POA: Diagnosis not present

## 2020-04-14 DIAGNOSIS — N186 End stage renal disease: Secondary | ICD-10-CM | POA: Diagnosis not present

## 2020-04-17 DIAGNOSIS — N2581 Secondary hyperparathyroidism of renal origin: Secondary | ICD-10-CM | POA: Diagnosis not present

## 2020-04-17 DIAGNOSIS — N186 End stage renal disease: Secondary | ICD-10-CM | POA: Diagnosis not present

## 2020-04-17 DIAGNOSIS — Z992 Dependence on renal dialysis: Secondary | ICD-10-CM | POA: Diagnosis not present

## 2020-04-19 DIAGNOSIS — N186 End stage renal disease: Secondary | ICD-10-CM | POA: Diagnosis not present

## 2020-04-19 DIAGNOSIS — Z992 Dependence on renal dialysis: Secondary | ICD-10-CM | POA: Diagnosis not present

## 2020-04-21 DIAGNOSIS — N186 End stage renal disease: Secondary | ICD-10-CM | POA: Diagnosis not present

## 2020-04-21 DIAGNOSIS — Z992 Dependence on renal dialysis: Secondary | ICD-10-CM | POA: Diagnosis not present

## 2020-04-24 DIAGNOSIS — N186 End stage renal disease: Secondary | ICD-10-CM | POA: Diagnosis not present

## 2020-04-24 DIAGNOSIS — Z992 Dependence on renal dialysis: Secondary | ICD-10-CM | POA: Diagnosis not present

## 2020-04-26 DIAGNOSIS — N186 End stage renal disease: Secondary | ICD-10-CM | POA: Diagnosis not present

## 2020-04-26 DIAGNOSIS — Z992 Dependence on renal dialysis: Secondary | ICD-10-CM | POA: Diagnosis not present

## 2020-04-30 DIAGNOSIS — N186 End stage renal disease: Secondary | ICD-10-CM | POA: Diagnosis not present

## 2020-04-30 DIAGNOSIS — Z992 Dependence on renal dialysis: Secondary | ICD-10-CM | POA: Diagnosis not present

## 2020-05-01 DIAGNOSIS — Z992 Dependence on renal dialysis: Secondary | ICD-10-CM | POA: Diagnosis not present

## 2020-05-01 DIAGNOSIS — N186 End stage renal disease: Secondary | ICD-10-CM | POA: Diagnosis not present

## 2020-05-01 DIAGNOSIS — N2581 Secondary hyperparathyroidism of renal origin: Secondary | ICD-10-CM | POA: Diagnosis not present

## 2020-05-01 DIAGNOSIS — D509 Iron deficiency anemia, unspecified: Secondary | ICD-10-CM | POA: Diagnosis not present

## 2020-05-03 DIAGNOSIS — Z992 Dependence on renal dialysis: Secondary | ICD-10-CM | POA: Diagnosis not present

## 2020-05-03 DIAGNOSIS — N186 End stage renal disease: Secondary | ICD-10-CM | POA: Diagnosis not present

## 2020-05-07 DIAGNOSIS — Z992 Dependence on renal dialysis: Secondary | ICD-10-CM | POA: Diagnosis not present

## 2020-05-07 DIAGNOSIS — Z789 Other specified health status: Secondary | ICD-10-CM | POA: Diagnosis not present

## 2020-05-07 DIAGNOSIS — Z20822 Contact with and (suspected) exposure to covid-19: Secondary | ICD-10-CM | POA: Diagnosis not present

## 2020-05-07 DIAGNOSIS — Z299 Encounter for prophylactic measures, unspecified: Secondary | ICD-10-CM | POA: Diagnosis not present

## 2020-05-07 DIAGNOSIS — R059 Cough, unspecified: Secondary | ICD-10-CM | POA: Diagnosis not present

## 2020-05-07 DIAGNOSIS — N186 End stage renal disease: Secondary | ICD-10-CM | POA: Diagnosis not present

## 2020-05-08 DIAGNOSIS — N186 End stage renal disease: Secondary | ICD-10-CM | POA: Diagnosis not present

## 2020-05-08 DIAGNOSIS — Z992 Dependence on renal dialysis: Secondary | ICD-10-CM | POA: Diagnosis not present

## 2020-05-08 DIAGNOSIS — E8770 Fluid overload, unspecified: Secondary | ICD-10-CM | POA: Diagnosis not present

## 2020-05-08 DIAGNOSIS — N2581 Secondary hyperparathyroidism of renal origin: Secondary | ICD-10-CM | POA: Diagnosis not present

## 2020-05-10 DIAGNOSIS — E8770 Fluid overload, unspecified: Secondary | ICD-10-CM | POA: Diagnosis not present

## 2020-05-10 DIAGNOSIS — Z992 Dependence on renal dialysis: Secondary | ICD-10-CM | POA: Diagnosis not present

## 2020-05-10 DIAGNOSIS — N186 End stage renal disease: Secondary | ICD-10-CM | POA: Diagnosis not present

## 2020-05-12 DIAGNOSIS — N186 End stage renal disease: Secondary | ICD-10-CM | POA: Diagnosis not present

## 2020-05-12 DIAGNOSIS — Z992 Dependence on renal dialysis: Secondary | ICD-10-CM | POA: Diagnosis not present

## 2020-05-12 DIAGNOSIS — E8770 Fluid overload, unspecified: Secondary | ICD-10-CM | POA: Diagnosis not present

## 2020-05-15 DIAGNOSIS — J9691 Respiratory failure, unspecified with hypoxia: Secondary | ICD-10-CM | POA: Diagnosis not present

## 2020-05-15 DIAGNOSIS — E8770 Fluid overload, unspecified: Secondary | ICD-10-CM | POA: Diagnosis not present

## 2020-05-15 DIAGNOSIS — Z992 Dependence on renal dialysis: Secondary | ICD-10-CM | POA: Diagnosis not present

## 2020-05-15 DIAGNOSIS — N186 End stage renal disease: Secondary | ICD-10-CM | POA: Diagnosis not present

## 2020-05-17 DIAGNOSIS — Z992 Dependence on renal dialysis: Secondary | ICD-10-CM | POA: Diagnosis not present

## 2020-05-17 DIAGNOSIS — E8770 Fluid overload, unspecified: Secondary | ICD-10-CM | POA: Diagnosis not present

## 2020-05-17 DIAGNOSIS — N186 End stage renal disease: Secondary | ICD-10-CM | POA: Diagnosis not present

## 2020-05-19 DIAGNOSIS — N186 End stage renal disease: Secondary | ICD-10-CM | POA: Diagnosis not present

## 2020-05-19 DIAGNOSIS — Z992 Dependence on renal dialysis: Secondary | ICD-10-CM | POA: Diagnosis not present

## 2020-05-19 DIAGNOSIS — E8770 Fluid overload, unspecified: Secondary | ICD-10-CM | POA: Diagnosis not present

## 2020-05-22 DIAGNOSIS — E8770 Fluid overload, unspecified: Secondary | ICD-10-CM | POA: Diagnosis not present

## 2020-05-22 DIAGNOSIS — Z992 Dependence on renal dialysis: Secondary | ICD-10-CM | POA: Diagnosis not present

## 2020-05-22 DIAGNOSIS — N186 End stage renal disease: Secondary | ICD-10-CM | POA: Diagnosis not present

## 2020-05-22 DIAGNOSIS — N2581 Secondary hyperparathyroidism of renal origin: Secondary | ICD-10-CM | POA: Diagnosis not present

## 2020-05-24 DIAGNOSIS — E8770 Fluid overload, unspecified: Secondary | ICD-10-CM | POA: Diagnosis not present

## 2020-05-24 DIAGNOSIS — Z992 Dependence on renal dialysis: Secondary | ICD-10-CM | POA: Diagnosis not present

## 2020-05-24 DIAGNOSIS — N186 End stage renal disease: Secondary | ICD-10-CM | POA: Diagnosis not present

## 2020-05-25 DIAGNOSIS — Z992 Dependence on renal dialysis: Secondary | ICD-10-CM | POA: Diagnosis not present

## 2020-05-25 DIAGNOSIS — E8770 Fluid overload, unspecified: Secondary | ICD-10-CM | POA: Diagnosis not present

## 2020-05-25 DIAGNOSIS — N186 End stage renal disease: Secondary | ICD-10-CM | POA: Diagnosis not present

## 2020-05-26 DIAGNOSIS — Z992 Dependence on renal dialysis: Secondary | ICD-10-CM | POA: Diagnosis not present

## 2020-05-26 DIAGNOSIS — E8770 Fluid overload, unspecified: Secondary | ICD-10-CM | POA: Diagnosis not present

## 2020-05-26 DIAGNOSIS — N186 End stage renal disease: Secondary | ICD-10-CM | POA: Diagnosis not present

## 2020-05-29 DIAGNOSIS — N186 End stage renal disease: Secondary | ICD-10-CM | POA: Diagnosis not present

## 2020-05-29 DIAGNOSIS — Z992 Dependence on renal dialysis: Secondary | ICD-10-CM | POA: Diagnosis not present

## 2020-05-29 DIAGNOSIS — E8770 Fluid overload, unspecified: Secondary | ICD-10-CM | POA: Diagnosis not present

## 2020-05-31 DIAGNOSIS — N186 End stage renal disease: Secondary | ICD-10-CM | POA: Diagnosis not present

## 2020-05-31 DIAGNOSIS — E8770 Fluid overload, unspecified: Secondary | ICD-10-CM | POA: Diagnosis not present

## 2020-05-31 DIAGNOSIS — Z992 Dependence on renal dialysis: Secondary | ICD-10-CM | POA: Diagnosis not present

## 2020-06-02 DIAGNOSIS — N186 End stage renal disease: Secondary | ICD-10-CM | POA: Diagnosis not present

## 2020-06-02 DIAGNOSIS — E8770 Fluid overload, unspecified: Secondary | ICD-10-CM | POA: Diagnosis not present

## 2020-06-02 DIAGNOSIS — Z992 Dependence on renal dialysis: Secondary | ICD-10-CM | POA: Diagnosis not present

## 2020-06-04 DIAGNOSIS — N186 End stage renal disease: Secondary | ICD-10-CM | POA: Diagnosis not present

## 2020-06-04 DIAGNOSIS — Z992 Dependence on renal dialysis: Secondary | ICD-10-CM | POA: Diagnosis not present

## 2020-06-05 DIAGNOSIS — D509 Iron deficiency anemia, unspecified: Secondary | ICD-10-CM | POA: Diagnosis not present

## 2020-06-05 DIAGNOSIS — E8770 Fluid overload, unspecified: Secondary | ICD-10-CM | POA: Diagnosis not present

## 2020-06-05 DIAGNOSIS — N186 End stage renal disease: Secondary | ICD-10-CM | POA: Diagnosis not present

## 2020-06-05 DIAGNOSIS — Z992 Dependence on renal dialysis: Secondary | ICD-10-CM | POA: Diagnosis not present

## 2020-06-05 DIAGNOSIS — E8779 Other fluid overload: Secondary | ICD-10-CM | POA: Diagnosis not present

## 2020-06-05 DIAGNOSIS — N2581 Secondary hyperparathyroidism of renal origin: Secondary | ICD-10-CM | POA: Diagnosis not present

## 2020-06-07 DIAGNOSIS — N186 End stage renal disease: Secondary | ICD-10-CM | POA: Diagnosis not present

## 2020-06-07 DIAGNOSIS — Z992 Dependence on renal dialysis: Secondary | ICD-10-CM | POA: Diagnosis not present

## 2020-06-07 DIAGNOSIS — E8770 Fluid overload, unspecified: Secondary | ICD-10-CM | POA: Diagnosis not present

## 2020-06-07 DIAGNOSIS — N2581 Secondary hyperparathyroidism of renal origin: Secondary | ICD-10-CM | POA: Diagnosis not present

## 2020-06-07 DIAGNOSIS — E8779 Other fluid overload: Secondary | ICD-10-CM | POA: Diagnosis not present

## 2020-06-08 DIAGNOSIS — E8770 Fluid overload, unspecified: Secondary | ICD-10-CM | POA: Diagnosis not present

## 2020-06-08 DIAGNOSIS — E8779 Other fluid overload: Secondary | ICD-10-CM | POA: Diagnosis not present

## 2020-06-08 DIAGNOSIS — N2581 Secondary hyperparathyroidism of renal origin: Secondary | ICD-10-CM | POA: Diagnosis not present

## 2020-06-08 DIAGNOSIS — N186 End stage renal disease: Secondary | ICD-10-CM | POA: Diagnosis not present

## 2020-06-08 DIAGNOSIS — Z992 Dependence on renal dialysis: Secondary | ICD-10-CM | POA: Diagnosis not present

## 2020-06-09 DIAGNOSIS — Z992 Dependence on renal dialysis: Secondary | ICD-10-CM | POA: Diagnosis not present

## 2020-06-09 DIAGNOSIS — N2581 Secondary hyperparathyroidism of renal origin: Secondary | ICD-10-CM | POA: Diagnosis not present

## 2020-06-09 DIAGNOSIS — N186 End stage renal disease: Secondary | ICD-10-CM | POA: Diagnosis not present

## 2020-06-09 DIAGNOSIS — E8779 Other fluid overload: Secondary | ICD-10-CM | POA: Diagnosis not present

## 2020-06-09 DIAGNOSIS — E8770 Fluid overload, unspecified: Secondary | ICD-10-CM | POA: Diagnosis not present

## 2020-06-12 DIAGNOSIS — E8779 Other fluid overload: Secondary | ICD-10-CM | POA: Diagnosis not present

## 2020-06-12 DIAGNOSIS — N2581 Secondary hyperparathyroidism of renal origin: Secondary | ICD-10-CM | POA: Diagnosis not present

## 2020-06-12 DIAGNOSIS — E8770 Fluid overload, unspecified: Secondary | ICD-10-CM | POA: Diagnosis not present

## 2020-06-12 DIAGNOSIS — N186 End stage renal disease: Secondary | ICD-10-CM | POA: Diagnosis not present

## 2020-06-12 DIAGNOSIS — J9691 Respiratory failure, unspecified with hypoxia: Secondary | ICD-10-CM | POA: Diagnosis not present

## 2020-06-12 DIAGNOSIS — Z992 Dependence on renal dialysis: Secondary | ICD-10-CM | POA: Diagnosis not present

## 2020-06-14 DIAGNOSIS — E8779 Other fluid overload: Secondary | ICD-10-CM | POA: Diagnosis not present

## 2020-06-14 DIAGNOSIS — E8770 Fluid overload, unspecified: Secondary | ICD-10-CM | POA: Diagnosis not present

## 2020-06-14 DIAGNOSIS — N2581 Secondary hyperparathyroidism of renal origin: Secondary | ICD-10-CM | POA: Diagnosis not present

## 2020-06-14 DIAGNOSIS — Z992 Dependence on renal dialysis: Secondary | ICD-10-CM | POA: Diagnosis not present

## 2020-06-14 DIAGNOSIS — N186 End stage renal disease: Secondary | ICD-10-CM | POA: Diagnosis not present

## 2020-06-16 DIAGNOSIS — Z992 Dependence on renal dialysis: Secondary | ICD-10-CM | POA: Diagnosis not present

## 2020-06-16 DIAGNOSIS — N2581 Secondary hyperparathyroidism of renal origin: Secondary | ICD-10-CM | POA: Diagnosis not present

## 2020-06-16 DIAGNOSIS — E8779 Other fluid overload: Secondary | ICD-10-CM | POA: Diagnosis not present

## 2020-06-16 DIAGNOSIS — E8770 Fluid overload, unspecified: Secondary | ICD-10-CM | POA: Diagnosis not present

## 2020-06-16 DIAGNOSIS — N186 End stage renal disease: Secondary | ICD-10-CM | POA: Diagnosis not present

## 2020-06-19 DIAGNOSIS — N186 End stage renal disease: Secondary | ICD-10-CM | POA: Diagnosis not present

## 2020-06-19 DIAGNOSIS — E8770 Fluid overload, unspecified: Secondary | ICD-10-CM | POA: Diagnosis not present

## 2020-06-19 DIAGNOSIS — N2581 Secondary hyperparathyroidism of renal origin: Secondary | ICD-10-CM | POA: Diagnosis not present

## 2020-06-19 DIAGNOSIS — E8779 Other fluid overload: Secondary | ICD-10-CM | POA: Diagnosis not present

## 2020-06-19 DIAGNOSIS — Z992 Dependence on renal dialysis: Secondary | ICD-10-CM | POA: Diagnosis not present

## 2020-06-21 DIAGNOSIS — N2581 Secondary hyperparathyroidism of renal origin: Secondary | ICD-10-CM | POA: Diagnosis not present

## 2020-06-21 DIAGNOSIS — Z992 Dependence on renal dialysis: Secondary | ICD-10-CM | POA: Diagnosis not present

## 2020-06-21 DIAGNOSIS — E8770 Fluid overload, unspecified: Secondary | ICD-10-CM | POA: Diagnosis not present

## 2020-06-21 DIAGNOSIS — N186 End stage renal disease: Secondary | ICD-10-CM | POA: Diagnosis not present

## 2020-06-21 DIAGNOSIS — E8779 Other fluid overload: Secondary | ICD-10-CM | POA: Diagnosis not present

## 2020-06-23 DIAGNOSIS — E8770 Fluid overload, unspecified: Secondary | ICD-10-CM | POA: Diagnosis not present

## 2020-06-23 DIAGNOSIS — E8779 Other fluid overload: Secondary | ICD-10-CM | POA: Diagnosis not present

## 2020-06-23 DIAGNOSIS — N186 End stage renal disease: Secondary | ICD-10-CM | POA: Diagnosis not present

## 2020-06-23 DIAGNOSIS — N2581 Secondary hyperparathyroidism of renal origin: Secondary | ICD-10-CM | POA: Diagnosis not present

## 2020-06-23 DIAGNOSIS — Z992 Dependence on renal dialysis: Secondary | ICD-10-CM | POA: Diagnosis not present

## 2020-06-26 DIAGNOSIS — Z992 Dependence on renal dialysis: Secondary | ICD-10-CM | POA: Diagnosis not present

## 2020-06-26 DIAGNOSIS — E8770 Fluid overload, unspecified: Secondary | ICD-10-CM | POA: Diagnosis not present

## 2020-06-26 DIAGNOSIS — N2581 Secondary hyperparathyroidism of renal origin: Secondary | ICD-10-CM | POA: Diagnosis not present

## 2020-06-26 DIAGNOSIS — E8779 Other fluid overload: Secondary | ICD-10-CM | POA: Diagnosis not present

## 2020-06-26 DIAGNOSIS — N186 End stage renal disease: Secondary | ICD-10-CM | POA: Diagnosis not present

## 2020-06-28 DIAGNOSIS — N2581 Secondary hyperparathyroidism of renal origin: Secondary | ICD-10-CM | POA: Diagnosis not present

## 2020-06-28 DIAGNOSIS — E8779 Other fluid overload: Secondary | ICD-10-CM | POA: Diagnosis not present

## 2020-06-28 DIAGNOSIS — E8770 Fluid overload, unspecified: Secondary | ICD-10-CM | POA: Diagnosis not present

## 2020-06-28 DIAGNOSIS — N186 End stage renal disease: Secondary | ICD-10-CM | POA: Diagnosis not present

## 2020-06-28 DIAGNOSIS — Z992 Dependence on renal dialysis: Secondary | ICD-10-CM | POA: Diagnosis not present

## 2020-06-30 DIAGNOSIS — Z992 Dependence on renal dialysis: Secondary | ICD-10-CM | POA: Diagnosis not present

## 2020-06-30 DIAGNOSIS — N2581 Secondary hyperparathyroidism of renal origin: Secondary | ICD-10-CM | POA: Diagnosis not present

## 2020-06-30 DIAGNOSIS — E8770 Fluid overload, unspecified: Secondary | ICD-10-CM | POA: Diagnosis not present

## 2020-06-30 DIAGNOSIS — N186 End stage renal disease: Secondary | ICD-10-CM | POA: Diagnosis not present

## 2020-06-30 DIAGNOSIS — E8779 Other fluid overload: Secondary | ICD-10-CM | POA: Diagnosis not present

## 2020-07-03 DIAGNOSIS — D509 Iron deficiency anemia, unspecified: Secondary | ICD-10-CM | POA: Diagnosis not present

## 2020-07-03 DIAGNOSIS — E8779 Other fluid overload: Secondary | ICD-10-CM | POA: Diagnosis not present

## 2020-07-03 DIAGNOSIS — Z992 Dependence on renal dialysis: Secondary | ICD-10-CM | POA: Diagnosis not present

## 2020-07-03 DIAGNOSIS — E119 Type 2 diabetes mellitus without complications: Secondary | ICD-10-CM | POA: Diagnosis not present

## 2020-07-03 DIAGNOSIS — E8770 Fluid overload, unspecified: Secondary | ICD-10-CM | POA: Diagnosis not present

## 2020-07-03 DIAGNOSIS — N2581 Secondary hyperparathyroidism of renal origin: Secondary | ICD-10-CM | POA: Diagnosis not present

## 2020-07-03 DIAGNOSIS — N186 End stage renal disease: Secondary | ICD-10-CM | POA: Diagnosis not present

## 2020-07-04 DIAGNOSIS — E1165 Type 2 diabetes mellitus with hyperglycemia: Secondary | ICD-10-CM | POA: Diagnosis not present

## 2020-07-04 DIAGNOSIS — E785 Hyperlipidemia, unspecified: Secondary | ICD-10-CM | POA: Diagnosis not present

## 2020-07-05 DIAGNOSIS — E8779 Other fluid overload: Secondary | ICD-10-CM | POA: Diagnosis not present

## 2020-07-05 DIAGNOSIS — N186 End stage renal disease: Secondary | ICD-10-CM | POA: Diagnosis not present

## 2020-07-05 DIAGNOSIS — Z992 Dependence on renal dialysis: Secondary | ICD-10-CM | POA: Diagnosis not present

## 2020-07-05 DIAGNOSIS — E8770 Fluid overload, unspecified: Secondary | ICD-10-CM | POA: Diagnosis not present

## 2020-07-05 DIAGNOSIS — N2581 Secondary hyperparathyroidism of renal origin: Secondary | ICD-10-CM | POA: Diagnosis not present

## 2020-07-07 DIAGNOSIS — N186 End stage renal disease: Secondary | ICD-10-CM | POA: Diagnosis not present

## 2020-07-07 DIAGNOSIS — N2581 Secondary hyperparathyroidism of renal origin: Secondary | ICD-10-CM | POA: Diagnosis not present

## 2020-07-07 DIAGNOSIS — Z992 Dependence on renal dialysis: Secondary | ICD-10-CM | POA: Diagnosis not present

## 2020-07-10 DIAGNOSIS — N2581 Secondary hyperparathyroidism of renal origin: Secondary | ICD-10-CM | POA: Diagnosis not present

## 2020-07-10 DIAGNOSIS — N186 End stage renal disease: Secondary | ICD-10-CM | POA: Diagnosis not present

## 2020-07-10 DIAGNOSIS — Z992 Dependence on renal dialysis: Secondary | ICD-10-CM | POA: Diagnosis not present

## 2020-07-12 DIAGNOSIS — E559 Vitamin D deficiency, unspecified: Secondary | ICD-10-CM | POA: Diagnosis not present

## 2020-07-12 DIAGNOSIS — N186 End stage renal disease: Secondary | ICD-10-CM | POA: Diagnosis not present

## 2020-07-12 DIAGNOSIS — N2581 Secondary hyperparathyroidism of renal origin: Secondary | ICD-10-CM | POA: Diagnosis not present

## 2020-07-12 DIAGNOSIS — Z992 Dependence on renal dialysis: Secondary | ICD-10-CM | POA: Diagnosis not present

## 2020-07-13 DIAGNOSIS — J9691 Respiratory failure, unspecified with hypoxia: Secondary | ICD-10-CM | POA: Diagnosis not present

## 2020-07-14 DIAGNOSIS — N2581 Secondary hyperparathyroidism of renal origin: Secondary | ICD-10-CM | POA: Diagnosis not present

## 2020-07-14 DIAGNOSIS — N186 End stage renal disease: Secondary | ICD-10-CM | POA: Diagnosis not present

## 2020-07-14 DIAGNOSIS — Z992 Dependence on renal dialysis: Secondary | ICD-10-CM | POA: Diagnosis not present

## 2020-07-17 DIAGNOSIS — Z992 Dependence on renal dialysis: Secondary | ICD-10-CM | POA: Diagnosis not present

## 2020-07-17 DIAGNOSIS — N186 End stage renal disease: Secondary | ICD-10-CM | POA: Diagnosis not present

## 2020-07-17 DIAGNOSIS — N2581 Secondary hyperparathyroidism of renal origin: Secondary | ICD-10-CM | POA: Diagnosis not present

## 2020-07-19 DIAGNOSIS — N2581 Secondary hyperparathyroidism of renal origin: Secondary | ICD-10-CM | POA: Diagnosis not present

## 2020-07-19 DIAGNOSIS — N186 End stage renal disease: Secondary | ICD-10-CM | POA: Diagnosis not present

## 2020-07-19 DIAGNOSIS — Z992 Dependence on renal dialysis: Secondary | ICD-10-CM | POA: Diagnosis not present

## 2020-07-21 DIAGNOSIS — Z992 Dependence on renal dialysis: Secondary | ICD-10-CM | POA: Diagnosis not present

## 2020-07-21 DIAGNOSIS — N2581 Secondary hyperparathyroidism of renal origin: Secondary | ICD-10-CM | POA: Diagnosis not present

## 2020-07-21 DIAGNOSIS — N186 End stage renal disease: Secondary | ICD-10-CM | POA: Diagnosis not present

## 2020-07-24 DIAGNOSIS — Z992 Dependence on renal dialysis: Secondary | ICD-10-CM | POA: Diagnosis not present

## 2020-07-24 DIAGNOSIS — N2581 Secondary hyperparathyroidism of renal origin: Secondary | ICD-10-CM | POA: Diagnosis not present

## 2020-07-24 DIAGNOSIS — N186 End stage renal disease: Secondary | ICD-10-CM | POA: Diagnosis not present

## 2020-07-26 DIAGNOSIS — N2581 Secondary hyperparathyroidism of renal origin: Secondary | ICD-10-CM | POA: Diagnosis not present

## 2020-07-26 DIAGNOSIS — N186 End stage renal disease: Secondary | ICD-10-CM | POA: Diagnosis not present

## 2020-07-26 DIAGNOSIS — Z992 Dependence on renal dialysis: Secondary | ICD-10-CM | POA: Diagnosis not present

## 2020-07-28 DIAGNOSIS — Z992 Dependence on renal dialysis: Secondary | ICD-10-CM | POA: Diagnosis not present

## 2020-07-28 DIAGNOSIS — N186 End stage renal disease: Secondary | ICD-10-CM | POA: Diagnosis not present

## 2020-07-28 DIAGNOSIS — N2581 Secondary hyperparathyroidism of renal origin: Secondary | ICD-10-CM | POA: Diagnosis not present

## 2020-07-31 DIAGNOSIS — Z992 Dependence on renal dialysis: Secondary | ICD-10-CM | POA: Diagnosis not present

## 2020-07-31 DIAGNOSIS — N186 End stage renal disease: Secondary | ICD-10-CM | POA: Diagnosis not present

## 2020-07-31 DIAGNOSIS — N2581 Secondary hyperparathyroidism of renal origin: Secondary | ICD-10-CM | POA: Diagnosis not present

## 2020-07-31 DIAGNOSIS — D509 Iron deficiency anemia, unspecified: Secondary | ICD-10-CM | POA: Diagnosis not present

## 2020-08-02 DIAGNOSIS — Z992 Dependence on renal dialysis: Secondary | ICD-10-CM | POA: Diagnosis not present

## 2020-08-02 DIAGNOSIS — E46 Unspecified protein-calorie malnutrition: Secondary | ICD-10-CM | POA: Diagnosis not present

## 2020-08-02 DIAGNOSIS — N186 End stage renal disease: Secondary | ICD-10-CM | POA: Diagnosis not present

## 2020-08-02 DIAGNOSIS — N2581 Secondary hyperparathyroidism of renal origin: Secondary | ICD-10-CM | POA: Diagnosis not present

## 2020-08-04 DIAGNOSIS — N186 End stage renal disease: Secondary | ICD-10-CM | POA: Diagnosis not present

## 2020-08-04 DIAGNOSIS — Z992 Dependence on renal dialysis: Secondary | ICD-10-CM | POA: Diagnosis not present

## 2020-08-04 DIAGNOSIS — N2581 Secondary hyperparathyroidism of renal origin: Secondary | ICD-10-CM | POA: Diagnosis not present

## 2020-08-07 DIAGNOSIS — N2581 Secondary hyperparathyroidism of renal origin: Secondary | ICD-10-CM | POA: Diagnosis not present

## 2020-08-07 DIAGNOSIS — Z992 Dependence on renal dialysis: Secondary | ICD-10-CM | POA: Diagnosis not present

## 2020-08-07 DIAGNOSIS — N186 End stage renal disease: Secondary | ICD-10-CM | POA: Diagnosis not present

## 2020-08-09 DIAGNOSIS — N186 End stage renal disease: Secondary | ICD-10-CM | POA: Diagnosis not present

## 2020-08-09 DIAGNOSIS — Z992 Dependence on renal dialysis: Secondary | ICD-10-CM | POA: Diagnosis not present

## 2020-08-09 DIAGNOSIS — E559 Vitamin D deficiency, unspecified: Secondary | ICD-10-CM | POA: Diagnosis not present

## 2020-08-09 DIAGNOSIS — N2581 Secondary hyperparathyroidism of renal origin: Secondary | ICD-10-CM | POA: Diagnosis not present

## 2020-08-11 DIAGNOSIS — N2581 Secondary hyperparathyroidism of renal origin: Secondary | ICD-10-CM | POA: Diagnosis not present

## 2020-08-11 DIAGNOSIS — N186 End stage renal disease: Secondary | ICD-10-CM | POA: Diagnosis not present

## 2020-08-11 DIAGNOSIS — Z992 Dependence on renal dialysis: Secondary | ICD-10-CM | POA: Diagnosis not present

## 2020-08-12 DIAGNOSIS — J9691 Respiratory failure, unspecified with hypoxia: Secondary | ICD-10-CM | POA: Diagnosis not present

## 2020-08-14 DIAGNOSIS — N2581 Secondary hyperparathyroidism of renal origin: Secondary | ICD-10-CM | POA: Diagnosis not present

## 2020-08-14 DIAGNOSIS — Z992 Dependence on renal dialysis: Secondary | ICD-10-CM | POA: Diagnosis not present

## 2020-08-14 DIAGNOSIS — N186 End stage renal disease: Secondary | ICD-10-CM | POA: Diagnosis not present

## 2020-08-16 DIAGNOSIS — N2581 Secondary hyperparathyroidism of renal origin: Secondary | ICD-10-CM | POA: Diagnosis not present

## 2020-08-16 DIAGNOSIS — N186 End stage renal disease: Secondary | ICD-10-CM | POA: Diagnosis not present

## 2020-08-16 DIAGNOSIS — Z992 Dependence on renal dialysis: Secondary | ICD-10-CM | POA: Diagnosis not present

## 2020-08-18 DIAGNOSIS — Z992 Dependence on renal dialysis: Secondary | ICD-10-CM | POA: Diagnosis not present

## 2020-08-18 DIAGNOSIS — N2581 Secondary hyperparathyroidism of renal origin: Secondary | ICD-10-CM | POA: Diagnosis not present

## 2020-08-18 DIAGNOSIS — N186 End stage renal disease: Secondary | ICD-10-CM | POA: Diagnosis not present

## 2020-08-21 DIAGNOSIS — N186 End stage renal disease: Secondary | ICD-10-CM | POA: Diagnosis not present

## 2020-08-21 DIAGNOSIS — Z992 Dependence on renal dialysis: Secondary | ICD-10-CM | POA: Diagnosis not present

## 2020-08-21 DIAGNOSIS — N2581 Secondary hyperparathyroidism of renal origin: Secondary | ICD-10-CM | POA: Diagnosis not present

## 2020-08-23 DIAGNOSIS — N186 End stage renal disease: Secondary | ICD-10-CM | POA: Diagnosis not present

## 2020-08-23 DIAGNOSIS — Z992 Dependence on renal dialysis: Secondary | ICD-10-CM | POA: Diagnosis not present

## 2020-08-23 DIAGNOSIS — N2581 Secondary hyperparathyroidism of renal origin: Secondary | ICD-10-CM | POA: Diagnosis not present

## 2020-08-25 ENCOUNTER — Other Ambulatory Visit: Payer: Self-pay

## 2020-08-25 ENCOUNTER — Emergency Department (HOSPITAL_COMMUNITY)
Admission: EM | Admit: 2020-08-25 | Discharge: 2020-08-25 | Disposition: A | Discharge status: Home / Self Care | Payer: Medicare Other | Source: Home / Self Care | Attending: Emergency Medicine | Admitting: Emergency Medicine

## 2020-08-25 ENCOUNTER — Emergency Department (HOSPITAL_COMMUNITY): Payer: Medicare Other

## 2020-08-25 ENCOUNTER — Encounter (HOSPITAL_COMMUNITY): Payer: Self-pay | Admitting: Emergency Medicine

## 2020-08-25 DIAGNOSIS — I10 Essential (primary) hypertension: Secondary | ICD-10-CM | POA: Insufficient documentation

## 2020-08-25 DIAGNOSIS — Z7982 Long term (current) use of aspirin: Secondary | ICD-10-CM | POA: Insufficient documentation

## 2020-08-25 DIAGNOSIS — T8249XA Other complication of vascular dialysis catheter, initial encounter: Secondary | ICD-10-CM | POA: Insufficient documentation

## 2020-08-25 DIAGNOSIS — Z992 Dependence on renal dialysis: Secondary | ICD-10-CM | POA: Diagnosis not present

## 2020-08-25 DIAGNOSIS — E119 Type 2 diabetes mellitus without complications: Secondary | ICD-10-CM | POA: Insufficient documentation

## 2020-08-25 DIAGNOSIS — Z7984 Long term (current) use of oral hypoglycemic drugs: Secondary | ICD-10-CM | POA: Insufficient documentation

## 2020-08-25 DIAGNOSIS — N19 Unspecified kidney failure: Secondary | ICD-10-CM | POA: Insufficient documentation

## 2020-08-25 DIAGNOSIS — N186 End stage renal disease: Secondary | ICD-10-CM | POA: Diagnosis not present

## 2020-08-25 DIAGNOSIS — R531 Weakness: Secondary | ICD-10-CM | POA: Insufficient documentation

## 2020-08-25 DIAGNOSIS — T82838A Hemorrhage of vascular prosthetic devices, implants and grafts, initial encounter: Secondary | ICD-10-CM | POA: Diagnosis not present

## 2020-08-25 DIAGNOSIS — Z789 Other specified health status: Secondary | ICD-10-CM

## 2020-08-25 DIAGNOSIS — N2581 Secondary hyperparathyroidism of renal origin: Secondary | ICD-10-CM | POA: Diagnosis not present

## 2020-08-25 DIAGNOSIS — E8770 Fluid overload, unspecified: Secondary | ICD-10-CM | POA: Diagnosis not present

## 2020-08-25 DIAGNOSIS — Z79899 Other long term (current) drug therapy: Secondary | ICD-10-CM | POA: Insufficient documentation

## 2020-08-25 LAB — CBC WITH DIFFERENTIAL/PLATELET
Abs Immature Granulocytes: 0.06 10*3/uL (ref 0.00–0.07)
Basophils Absolute: 0.1 10*3/uL (ref 0.0–0.1)
Basophils Relative: 0 %
Eosinophils Absolute: 0.5 10*3/uL (ref 0.0–0.5)
Eosinophils Relative: 4 %
HCT: 35.2 % — ABNORMAL LOW (ref 36.0–46.0)
Hemoglobin: 10.7 g/dL — ABNORMAL LOW (ref 12.0–15.0)
Immature Granulocytes: 1 %
Lymphocytes Relative: 8 %
Lymphs Abs: 0.9 10*3/uL (ref 0.7–4.0)
MCH: 31.3 pg (ref 26.0–34.0)
MCHC: 30.4 g/dL (ref 30.0–36.0)
MCV: 102.9 fL — ABNORMAL HIGH (ref 80.0–100.0)
Monocytes Absolute: 0.8 10*3/uL (ref 0.1–1.0)
Monocytes Relative: 7 %
Neutro Abs: 9 10*3/uL — ABNORMAL HIGH (ref 1.7–7.7)
Neutrophils Relative %: 80 %
Platelets: 174 10*3/uL (ref 150–400)
RBC: 3.42 MIL/uL — ABNORMAL LOW (ref 3.87–5.11)
RDW: 13.4 % (ref 11.5–15.5)
WBC: 11.3 10*3/uL — ABNORMAL HIGH (ref 4.0–10.5)
nRBC: 0 % (ref 0.0–0.2)

## 2020-08-25 LAB — BASIC METABOLIC PANEL
Anion gap: 10 (ref 5–15)
BUN: 33 mg/dL — ABNORMAL HIGH (ref 6–20)
CO2: 29 mmol/L (ref 22–32)
Calcium: 8.4 mg/dL — ABNORMAL LOW (ref 8.9–10.3)
Chloride: 98 mmol/L (ref 98–111)
Creatinine, Ser: 7.39 mg/dL — ABNORMAL HIGH (ref 0.44–1.00)
GFR, Estimated: 6 mL/min — ABNORMAL LOW (ref >60)
Glucose, Bld: 132 mg/dL — ABNORMAL HIGH (ref 70–99)
Potassium: 3.1 mmol/L — ABNORMAL LOW (ref 3.5–5.1)
Sodium: 137 mmol/L (ref 135–145)

## 2020-08-25 NOTE — ED Notes (Signed)
Pt taking her PO HTN medications.

## 2020-08-25 NOTE — Discharge Instructions (Addendum)
You are suppose to get a call Monday morning from interventional radiology to tell you what time and where you need to be on Monday to get your dialysis catheter replaced.  If you do not hear from them by 9:30 AM Monday then you can call [phone] and tell them you are supposed to get your dialysis catheter replaced on Monday.  The nephrologist Dr. Posey Pronto is the physician who I spoke to today who wants it done on Monday.  If the catheter comes out completely you do not need to be seen right away just put a bandage over it and get seen Monday

## 2020-08-25 NOTE — ED Provider Notes (Signed)
[facility] EMERGENCY DEPARTMENT Provider Note   CSN: [id number] Arrival date & time: 08/25/20  1455     History Chief Complaint  Patient presents with  . Vascular Access Problem    Courtney Gray is a 50 y.o. female.  Patient was getting dialysis today when her dialysis catheter came out of her chest about 5 cm.  She was sent over here for further evaluation.  She got about half her dialysis time today.  Patient has no  The history is provided by the patient and medical records. No language interpreter was used.  Weakness Severity:  Unable to specify Onset quality:  Sudden Timing:  Constant Progression:  Waxing and waning Chronicity:  New Context: not alcohol use   Relieved by:  Nothing Worsened by:  Nothing Ineffective treatments:  None tried Associated symptoms: no abdominal pain, no chest pain, no cough, no diarrhea, no frequency, no headaches and no seizures        Past Medical History:  Diagnosis Date  . Diabetes mellitus without complication ([place])   . High cholesterol   . Hypertension   . Renal disorder     Patient Active Problem List   Diagnosis Date Noted  . Acute respiratory failure with hypoxia ([place]) 11/28/2019  . Fluid overload 11/28/2019  . Hypertensive urgency 11/28/2019  . Type 2 diabetes mellitus with other specified complication ([place]) [id number]  . Anemia of chronic disease 11/28/2019  . Morbid obesity ([place]) 11/28/2019    Past Surgical History:  Procedure Laterality Date  . AV FISTULA PLACEMENT     x2, unsuccessful.      OB History    Gravida  2   Para  2   Term  2   Preterm      AB      Living  2     SAB      IAB      Ectopic      Multiple      Live Births              Family History  Problem Relation Age of Onset  . Diabetes Mother   . Diabetes Father     Social History   Tobacco Use  . Smoking status: Never Smoker  . Smokeless tobacco: Never Used  Vaping Use  . Vaping Use: Never used  Substance  Use Topics  . Alcohol use: Never  . Drug use: Never    Home Medications Prior to Admission medications   Medication Sig Start Date End Date Taking? Authorizing Provider  acetaminophen (TYLENOL) 500 MG tablet Take 1,000 mg by mouth every 6 (six) hours as needed for moderate pain or headache.   Yes [provider]  amLODipine (NORVASC) 10 MG tablet Take 10 mg by mouth daily at 12 noon.  03/27/18  Yes [provider]  aspirin EC 81 MG tablet Take 81 mg by mouth daily.   Yes [provider]  atorvastatin (LIPITOR) 10 MG tablet Take 10 mg by mouth every evening.  03/29/19  Yes [provider]  carvedilol (COREG) 25 MG tablet Take 25 mg by mouth 2 (two) times daily. 04/20/19  Yes [provider]  diphenhydrAMINE (BENADRYL) 25 MG tablet Take 50 mg by mouth daily as needed for allergies.   Yes [provider]  docusate sodium (COLACE) 100 MG capsule Take 100 mg by mouth daily as needed for mild constipation.    Yes [provider]  famotidine (PEPCID) 20 MG tablet Take  20 mg by mouth daily as needed for heartburn or indigestion.   Yes [provider]  gabapentin (NEURONTIN) 100 MG capsule Take 100-200 mg by mouth See admin instructions. Take 100 mg at night. Take 200 mg after dialysis on Tuesdays, Thursdays,and Saturdays 04/20/19  Yes [provider]  glipiZIDE (GLUCOTROL) 5 MG tablet Take 5 mg by mouth daily.   Yes [provider]  LANTUS SOLOSTAR 100 UNIT/ML Solostar Pen Inject 12 Units into the skin at bedtime.  03/18/19  Yes [provider]  lidocaine-prilocaine (EMLA) cream Apply 1 application topically as needed (port access).   Yes [provider]  lisinopril (ZESTRIL) 5 MG tablet Take 5 mg by mouth daily. 11/11/19  Yes [provider]  nitroGLYCERIN (NITROSTAT) 0.4 MG SL tablet Place 0.4 mg under the tongue every 5 (five) minutes as needed for chest pain.   Yes [provider]  ondansetron (ZOFRAN) 4 MG tablet Take 1 tablet (4 mg total) by mouth every 6 (six) hours. 05/11/19  Yes Alveria Apley, PA-C  promethazine (PHENERGAN) 25 MG suppository Place 1 suppository (25 mg total) rectally every 6 (six) hours as needed for nausea or vomiting. 04/08/20  Yes Milton Ferguson, MD  Simethicone (GAS-X PO) Take 1 tablet by mouth daily as needed (gas).   Yes [provider]  sodium chloride (OCEAN) 0.65 % SOLN nasal spray Place 1 spray into both nostrils in the morning and at bedtime.   Yes [provider]  tiZANidine (ZANAFLEX) 4 MG tablet Take 4 mg by mouth every 8 (eight) hours.  04/20/19  Yes [provider]    Allergies    Benzonatate, Hydralazine, Amoxicillin, Clonidine, Morphine, and Oxycodone  Review of Systems   Review of Systems  Constitutional: Negative for appetite change and fatigue.  HENT: Negative for congestion, ear discharge and sinus pressure.   Eyes: Negative for discharge.  Respiratory: Negative for cough.   Cardiovascular: Negative for chest pain.       Dialysis catheter slipping out of chest  Gastrointestinal: Negative for abdominal pain and diarrhea.  Genitourinary: Negative for frequency and hematuria.  Musculoskeletal: Negative for back pain.  Skin: Negative for rash.  Neurological: Negative for seizures and headaches.  Psychiatric/Behavioral: Negative for hallucinations.    Physical Exam Updated Vital Signs BP (!) 238/107 (BP Location: Left Wrist)   Pulse 66   Temp 97.9 F (36.6 C) (Oral)   Resp 20   Ht '5\' 5"'$  (1.651 m)   Wt (!) 163.3 kg   SpO2 100%   BMI 59.91 kg/m   Physical Exam Vitals and nursing note reviewed.  Constitutional:      Appearance: She is well-developed.  HENT:     Head: Normocephalic.  Eyes:     General: No scleral icterus.    Conjunctiva/sclera: Conjunctivae normal.  Neck:     Thyroid: No thyromegaly.  Cardiovascular:     Rate and Rhythm: Normal rate and regular rhythm.     Heart  sounds: No murmur heard. No friction rub. No gallop.   Pulmonary:     Breath sounds: No stridor. No wheezing or rales.     Comments: Patient has a right subclavian dialysis catheter that has come out about 5 cm out of her chest Chest:     Chest wall: No tenderness.  Abdominal:     General: There is no distension.     Tenderness: There is no abdominal tenderness. There is no rebound.  Musculoskeletal:  General: Normal range of motion.     Cervical back: Neck supple.  Lymphadenopathy:     Cervical: No cervical adenopathy.  Skin:    Findings: No erythema or rash.  Neurological:     Mental Status: She is alert and oriented to person, place, and time.     Motor: No abnormal muscle tone.     Coordination: Coordination normal.  Psychiatric:        Behavior: Behavior normal.     ED Results / Procedures / Treatments   Labs (all labs ordered are listed, but only abnormal results are displayed) Labs Reviewed  CBC WITH DIFFERENTIAL/PLATELET - Abnormal; Notable for the following components:      Result Value   WBC 11.3 (*)    RBC 3.42 (*)    Hemoglobin 10.7 (*)    HCT 35.2 (*)    MCV 102.9 (*)    Neutro Abs 9.0 (*)    All other components within normal limits  BASIC METABOLIC PANEL - Abnormal; Notable for the following components:   Potassium 3.1 (*)    Glucose, Bld 132 (*)    BUN 33 (*)    Creatinine, Ser 7.39 (*)    Calcium 8.4 (*)    GFR, Estimated 6 (*)    All other components within normal limits    EKG None  Radiology DG Chest Port 1 View  Result Date: 08/25/2020 CLINICAL DATA:  Weakness. EXAM: PORTABLE CHEST 1 VIEW COMPARISON:  November 28, 2019 FINDINGS: A right-sided venous catheter is seen with its distal tip overlying the midportion of the superior vena cava. This is approximately 5.0 cm proximal to the junction of the superior vena cava and right atrium. There is no evidence of acute infiltrate, pleural effusion or pneumothorax. Stable moderate to marked  severity enlargement of the cardiac silhouette is seen. The visualized skeletal structures are unremarkable. IMPRESSION: Right-sided venous catheter positioning, as described above. Electronically Signed   By: Virgina Norfolk M.D.   On: 08/25/2020 15:43    Procedures Procedures   Medications Ordered in ED Medications - No data to display  ED Course  I have reviewed the triage vital signs and the nursing notes.  Pertinent labs & imaging results that were available during my care of the patient were reviewed by me and considered in my medical decision making (see chart for details). I spoke with nephrology Dr. Posey Pronto and he stated that the patient needs to have interventional radiology at [facility] replace the catheter on Monday.  I called IR and they are going to contact her Monday morning and tell her where to go and when   MDM Rules/Calculators/A&P                         Patient with a dialysis catheter that slipping out of her chest.  She is going in 2 days to [facility] to get it replaced x-ray shows no heart failure and lab work just shows kidney failure but no need for dialysis today  Final Clinical Impression(s) / ED Diagnoses Final diagnoses:  Problem with vascular access    Rx / DC Orders ED Discharge Orders    None       Milton Ferguson, MD 08/25/20 1718

## 2020-08-25 NOTE — ED Triage Notes (Signed)
Pt arrived via [facility] from dialysis. Pt was receiving dialysis when her right chest dialysis catheter slide out about 4 to 5 inches. Pt only received about 2 hours of her treatment.

## 2020-08-25 NOTE — Care Management (Addendum)
Consulted as patient is without transportation to home. She wears oxygen and walks with a cane. Has Medicaid uses [employer] she can call number ( given to nurse) and give them her code to see if they will take her home and arrange for transportation on Monday for catheter exchange. IF they do not go to [place], then will arrange safe transport.    [place] scheduled

## 2020-08-27 ENCOUNTER — Other Ambulatory Visit (HOSPITAL_COMMUNITY): Payer: Self-pay | Admitting: Medical

## 2020-08-27 ENCOUNTER — Telehealth (HOSPITAL_COMMUNITY): Payer: Self-pay

## 2020-08-27 DIAGNOSIS — N186 End stage renal disease: Secondary | ICD-10-CM

## 2020-08-27 NOTE — Telephone Encounter (Signed)
Returned pt's call, no answer, left vm. AW  

## 2020-08-28 ENCOUNTER — Other Ambulatory Visit: Payer: Self-pay

## 2020-08-28 ENCOUNTER — Emergency Department (HOSPITAL_COMMUNITY): Payer: Medicare Other

## 2020-08-28 ENCOUNTER — Inpatient Hospital Stay (HOSPITAL_COMMUNITY)
Admission: EM | Admit: 2020-08-28 | Discharge: 2020-08-31 | DRG: 640 | Disposition: A | Discharge status: Home / Self Care | Payer: Medicare Other | Attending: Hospitalist | Admitting: Hospitalist

## 2020-08-28 ENCOUNTER — Ambulatory Visit (HOSPITAL_COMMUNITY)
Admission: RE | Admit: 2020-08-28 | Discharge: 2020-08-28 | Disposition: A | Discharge status: Home / Self Care | Payer: Medicare Other | Source: Ambulatory Visit | Attending: Medical | Admitting: Medical

## 2020-08-28 DIAGNOSIS — R531 Weakness: Secondary | ICD-10-CM | POA: Diagnosis present

## 2020-08-28 DIAGNOSIS — Z794 Long term (current) use of insulin: Secondary | ICD-10-CM

## 2020-08-28 DIAGNOSIS — Z888 Allergy status to other drugs, medicaments and biological substances status: Secondary | ICD-10-CM

## 2020-08-28 DIAGNOSIS — E1122 Type 2 diabetes mellitus with diabetic chronic kidney disease: Secondary | ICD-10-CM | POA: Diagnosis present

## 2020-08-28 DIAGNOSIS — Z4902 Encounter for fitting and adjustment of peritoneal dialysis catheter: Secondary | ICD-10-CM | POA: Insufficient documentation

## 2020-08-28 DIAGNOSIS — I161 Hypertensive emergency: Secondary | ICD-10-CM | POA: Diagnosis not present

## 2020-08-28 DIAGNOSIS — I953 Hypotension of hemodialysis: Secondary | ICD-10-CM | POA: Diagnosis not present

## 2020-08-28 DIAGNOSIS — J9690 Respiratory failure, unspecified, unspecified whether with hypoxia or hypercapnia: Secondary | ICD-10-CM | POA: Diagnosis present

## 2020-08-28 DIAGNOSIS — E8770 Fluid overload, unspecified: Principal | ICD-10-CM | POA: Diagnosis present

## 2020-08-28 DIAGNOSIS — Z885 Allergy status to narcotic agent status: Secondary | ICD-10-CM | POA: Diagnosis not present

## 2020-08-28 DIAGNOSIS — E11649 Type 2 diabetes mellitus with hypoglycemia without coma: Secondary | ICD-10-CM | POA: Diagnosis not present

## 2020-08-28 DIAGNOSIS — Z7984 Long term (current) use of oral hypoglycemic drugs: Secondary | ICD-10-CM

## 2020-08-28 DIAGNOSIS — M898X9 Other specified disorders of bone, unspecified site: Secondary | ICD-10-CM | POA: Diagnosis not present

## 2020-08-28 DIAGNOSIS — Z7689 Persons encountering health services in other specified circumstances: Secondary | ICD-10-CM | POA: Diagnosis not present

## 2020-08-28 DIAGNOSIS — N250 Renal osteodystrophy: Secondary | ICD-10-CM | POA: Diagnosis not present

## 2020-08-28 DIAGNOSIS — R509 Fever, unspecified: Secondary | ICD-10-CM | POA: Diagnosis not present

## 2020-08-28 DIAGNOSIS — D631 Anemia in chronic kidney disease: Secondary | ICD-10-CM | POA: Diagnosis not present

## 2020-08-28 DIAGNOSIS — Z833 Family history of diabetes mellitus: Secondary | ICD-10-CM | POA: Diagnosis not present

## 2020-08-28 DIAGNOSIS — R0603 Acute respiratory distress: Secondary | ICD-10-CM | POA: Diagnosis not present

## 2020-08-28 DIAGNOSIS — I517 Cardiomegaly: Secondary | ICD-10-CM | POA: Diagnosis not present

## 2020-08-28 DIAGNOSIS — J9621 Acute and chronic respiratory failure with hypoxia: Secondary | ICD-10-CM | POA: Diagnosis present

## 2020-08-28 DIAGNOSIS — Z79899 Other long term (current) drug therapy: Secondary | ICD-10-CM

## 2020-08-28 DIAGNOSIS — T8242XA Displacement of vascular dialysis catheter, initial encounter: Secondary | ICD-10-CM | POA: Diagnosis present

## 2020-08-28 DIAGNOSIS — Z20822 Contact with and (suspected) exposure to covid-19: Secondary | ICD-10-CM | POA: Diagnosis present

## 2020-08-28 DIAGNOSIS — Z881 Allergy status to other antibiotic agents status: Secondary | ICD-10-CM

## 2020-08-28 DIAGNOSIS — N186 End stage renal disease: Secondary | ICD-10-CM | POA: Insufficient documentation

## 2020-08-28 DIAGNOSIS — E785 Hyperlipidemia, unspecified: Secondary | ICD-10-CM | POA: Diagnosis not present

## 2020-08-28 DIAGNOSIS — I132 Hypertensive heart and chronic kidney disease with heart failure and with stage 5 chronic kidney disease, or end stage renal disease: Secondary | ICD-10-CM | POA: Diagnosis not present

## 2020-08-28 DIAGNOSIS — I509 Heart failure, unspecified: Secondary | ICD-10-CM | POA: Diagnosis not present

## 2020-08-28 DIAGNOSIS — J9601 Acute respiratory failure with hypoxia: Secondary | ICD-10-CM

## 2020-08-28 DIAGNOSIS — Z992 Dependence on renal dialysis: Secondary | ICD-10-CM | POA: Diagnosis not present

## 2020-08-28 DIAGNOSIS — Y828 Other medical devices associated with adverse incidents: Secondary | ICD-10-CM | POA: Diagnosis present

## 2020-08-28 DIAGNOSIS — J449 Chronic obstructive pulmonary disease, unspecified: Secondary | ICD-10-CM | POA: Diagnosis not present

## 2020-08-28 DIAGNOSIS — Z7982 Long term (current) use of aspirin: Secondary | ICD-10-CM

## 2020-08-28 DIAGNOSIS — E7800 Pure hypercholesterolemia, unspecified: Secondary | ICD-10-CM | POA: Diagnosis not present

## 2020-08-28 DIAGNOSIS — Z6841 Body Mass Index (BMI) 40.0 and over, adult: Secondary | ICD-10-CM

## 2020-08-28 DIAGNOSIS — J90 Pleural effusion, not elsewhere classified: Secondary | ICD-10-CM | POA: Diagnosis not present

## 2020-08-28 DIAGNOSIS — R21 Rash and other nonspecific skin eruption: Secondary | ICD-10-CM | POA: Diagnosis not present

## 2020-08-28 DIAGNOSIS — E8779 Other fluid overload: Secondary | ICD-10-CM | POA: Diagnosis not present

## 2020-08-28 DIAGNOSIS — J9811 Atelectasis: Secondary | ICD-10-CM | POA: Diagnosis not present

## 2020-08-28 HISTORY — PX: IR FLUORO GUIDE CV LINE RIGHT: IMG2283

## 2020-08-28 HISTORY — PX: IR FLUORO GUIDE CV LINE LEFT: IMG2282

## 2020-08-28 LAB — CBC WITH DIFFERENTIAL/PLATELET
Abs Immature Granulocytes: 0.06 10*3/uL (ref 0.00–0.07)
Basophils Absolute: 0 10*3/uL (ref 0.0–0.1)
Basophils Relative: 0 %
Eosinophils Absolute: 0.6 10*3/uL — ABNORMAL HIGH (ref 0.0–0.5)
Eosinophils Relative: 4 %
HCT: 36 % (ref 36.0–46.0)
Hemoglobin: 10.9 g/dL — ABNORMAL LOW (ref 12.0–15.0)
Immature Granulocytes: 1 %
Lymphocytes Relative: 9 %
Lymphs Abs: 1.2 10*3/uL (ref 0.7–4.0)
MCH: 31.2 pg (ref 26.0–34.0)
MCHC: 30.3 g/dL (ref 30.0–36.0)
MCV: 103.2 fL — ABNORMAL HIGH (ref 80.0–100.0)
Monocytes Absolute: 0.8 10*3/uL (ref 0.1–1.0)
Monocytes Relative: 6 %
Neutro Abs: 10.4 10*3/uL — ABNORMAL HIGH (ref 1.7–7.7)
Neutrophils Relative %: 80 %
Platelets: 194 10*3/uL (ref 150–400)
RBC: 3.49 MIL/uL — ABNORMAL LOW (ref 3.87–5.11)
RDW: 13.3 % (ref 11.5–15.5)
WBC: 13 10*3/uL — ABNORMAL HIGH (ref 4.0–10.5)
nRBC: 0 % (ref 0.0–0.2)

## 2020-08-28 LAB — RESP PANEL BY RT-PCR (FLU A&B, COVID) ARPGX2
Influenza A by PCR: NEGATIVE
Influenza B by PCR: NEGATIVE
SARS Coronavirus 2 by RT PCR: NEGATIVE

## 2020-08-28 LAB — HEPATITIS B SURFACE ANTIBODY,QUALITATIVE: Hep B S Ab: NONREACTIVE

## 2020-08-28 LAB — I-STAT CHEM 8, ED
BUN: 69 mg/dL — ABNORMAL HIGH (ref 6–20)
Calcium, Ion: 0.87 mmol/L — CL (ref 1.15–1.40)
Chloride: 106 mmol/L (ref 98–111)
Creatinine, Ser: 15.8 mg/dL — ABNORMAL HIGH (ref 0.44–1.00)
Glucose, Bld: 132 mg/dL — ABNORMAL HIGH (ref 70–99)
HCT: 32 % — ABNORMAL LOW (ref 36.0–46.0)
Hemoglobin: 10.9 g/dL — ABNORMAL LOW (ref 12.0–15.0)
Potassium: 4.3 mmol/L (ref 3.5–5.1)
Sodium: 137 mmol/L (ref 135–145)
TCO2: 22 mmol/L (ref 22–32)

## 2020-08-28 LAB — COMPREHENSIVE METABOLIC PANEL
ALT: 20 U/L (ref 0–44)
AST: 19 U/L (ref 15–41)
Albumin: 3.9 g/dL (ref 3.5–5.0)
Alkaline Phosphatase: 58 U/L (ref 38–126)
Anion gap: 14 (ref 5–15)
BUN: 71 mg/dL — ABNORMAL HIGH (ref 6–20)
CO2: 22 mmol/L (ref 22–32)
Calcium: 8.4 mg/dL — ABNORMAL LOW (ref 8.9–10.3)
Chloride: 102 mmol/L (ref 98–111)
Creatinine, Ser: 14.79 mg/dL — ABNORMAL HIGH (ref 0.44–1.00)
GFR, Estimated: 3 mL/min — ABNORMAL LOW (ref >60)
Glucose, Bld: 134 mg/dL — ABNORMAL HIGH (ref 70–99)
Potassium: 4.4 mmol/L (ref 3.5–5.1)
Sodium: 138 mmol/L (ref 135–145)
Total Bilirubin: 0.9 mg/dL (ref 0.3–1.2)
Total Protein: 7.5 g/dL (ref 6.5–8.1)

## 2020-08-28 LAB — HEPATITIS B SURFACE ANTIGEN: Hepatitis B Surface Ag: NONREACTIVE

## 2020-08-28 LAB — GLUCOSE, CAPILLARY: Glucose-Capillary: 71 mg/dL (ref 70–99)

## 2020-08-28 LAB — TROPONIN I (HIGH SENSITIVITY): Troponin I (High Sensitivity): 22 ng/L — ABNORMAL HIGH (ref <18)

## 2020-08-28 MED ORDER — HEPARIN SODIUM (PORCINE) 1000 UNIT/ML IJ SOLN
INTRAMUSCULAR | Status: AC
  Filled 2020-08-28: qty 1

## 2020-08-28 MED ORDER — NITROGLYCERIN IN D5W 200-5 MCG/ML-% IV SOLN
0.0000 ug/min | INTRAVENOUS | Status: DC
  Administered 2020-08-28 (×2): 5 ug/min via INTRAVENOUS
  Administered 2020-08-29: 120 ug/min via INTRAVENOUS
  Filled 2020-08-28 (×2): qty 250

## 2020-08-28 MED ORDER — SODIUM CHLORIDE 0.9 % IV SOLN
12.5000 mg | Freq: Four times a day (QID) | INTRAVENOUS | Status: DC | PRN
  Administered 2020-08-30: 12.5 mg via INTRAVENOUS
  Filled 2020-08-28: qty 0.5

## 2020-08-28 MED ORDER — VANCOMYCIN HCL 1500 MG/300ML IV SOLN
1500.0000 mg | Freq: Once | INTRAVENOUS | Status: AC
  Administered 2020-08-28: 1500 mg via INTRAVENOUS
  Filled 2020-08-28: qty 300

## 2020-08-28 MED ORDER — CHLORHEXIDINE GLUCONATE CLOTH 2 % EX PADS
6.0000 | MEDICATED_PAD | Freq: Every day | CUTANEOUS | Status: DC
  Administered 2020-08-29: 6 via TOPICAL

## 2020-08-28 MED ORDER — HEPARIN SODIUM (PORCINE) 5000 UNIT/ML IJ SOLN
5000.0000 [IU] | Freq: Three times a day (TID) | INTRAMUSCULAR | Status: DC
  Filled 2020-08-28 (×3): qty 1

## 2020-08-28 MED ORDER — POLYETHYLENE GLYCOL 3350 17 G PO PACK: 17.0000 g | PACK | Freq: Every day | ORAL | Status: DC | PRN

## 2020-08-28 MED ORDER — INSULIN ASPART 100 UNIT/ML IJ SOLN: 0.0000 [IU] | INTRAMUSCULAR | Status: DC

## 2020-08-28 MED ORDER — VANCOMYCIN HCL IN DEXTROSE 1-5 GM/200ML-% IV SOLN: 1000.0000 mg | Freq: Once | INTRAVENOUS | Status: DC

## 2020-08-28 MED ORDER — DOCUSATE SODIUM 100 MG PO CAPS: 100.0000 mg | ORAL_CAPSULE | Freq: Two times a day (BID) | ORAL | Status: DC | PRN

## 2020-08-28 MED ORDER — LIDOCAINE HCL (PF) 1 % IJ SOLN
INTRAMUSCULAR | Status: AC
  Filled 2020-08-28: qty 30

## 2020-08-28 MED ORDER — IOHEXOL 300 MG/ML  SOLN
50.0000 mL | Freq: Once | INTRAMUSCULAR | Status: AC | PRN
  Administered 2020-08-28: 20 mL via INTRAVENOUS

## 2020-08-28 MED ORDER — DARBEPOETIN ALFA 25 MCG/0.42ML IJ SOSY: 25.0000 ug | PREFILLED_SYRINGE | INTRAMUSCULAR | Status: DC

## 2020-08-28 NOTE — ED Provider Notes (Signed)
[facility] EMERGENCY DEPARTMENT Provider Note   CSN: [id number] Arrival date & time: 08/28/20  1538     History Chief Complaint  Patient presents with  . Respiratory Distress    Courtney Gray is a 50 y.o. female.  The history is provided by the patient and medical records. No language interpreter was used.  Shortness of Breath Severity:  Severe Onset quality:  Gradual Duration:  30 minutes Timing:  Constant Progression:  Unchanged Chronicity:  Recurrent Relieved by:  Nothing Worsened by:  Nothing Associated symptoms: no abdominal pain, no chest pain, no claudication, no cough, no diaphoresis, no fever, no headaches, no hemoptysis, no sputum production, no vomiting and no wheezing   Risk factors: obesity        Past Medical History:  Diagnosis Date  . Diabetes mellitus without complication ([place])   . High cholesterol   . Hypertension   . Renal disorder     Patient Active Problem List   Diagnosis Date Noted  . Acute respiratory failure with hypoxia ([place]) 11/28/2019  . Fluid overload 11/28/2019  . Hypertensive urgency 11/28/2019  . Type 2 diabetes mellitus with other specified complication ([place]) [id number]  . Anemia of chronic disease 11/28/2019  . Morbid obesity ([place]) 11/28/2019    Past Surgical History:  Procedure Laterality Date  . AV FISTULA PLACEMENT     x2, unsuccessful.      OB History    Gravida  2   Para  2   Term  2   Preterm      AB      Living  2     SAB      IAB      Ectopic      Multiple      Live Births              Family History  Problem Relation Age of Onset  . Diabetes Mother   . Diabetes Father     Social History   Tobacco Use  . Smoking status: Never Smoker  . Smokeless tobacco: Never Used  Vaping Use  . Vaping Use: Never used  Substance Use Topics  . Alcohol use: Never  . Drug use: Never    Home Medications Prior to Admission medications   Medication Sig Start Date End Date  Taking? Authorizing Provider  acetaminophen (TYLENOL) 500 MG tablet Take 1,000 mg by mouth every 6 (six) hours as needed for moderate pain or headache.    [provider]  amLODipine (NORVASC) 10 MG tablet Take 10 mg by mouth daily at 12 noon.  03/27/18   [provider]  aspirin EC 81 MG tablet Take 81 mg by mouth daily.    [provider]  atorvastatin (LIPITOR) 10 MG tablet Take 10 mg by mouth every evening.  03/29/19   [provider]  carvedilol (COREG) 25 MG tablet Take 25 mg by mouth 2 (two) times daily. 04/20/19   [provider]  diphenhydrAMINE (BENADRYL) 25 MG tablet Take 50 mg by mouth daily as needed for allergies.    [provider]  docusate sodium (COLACE) 100 MG capsule Take 100 mg by mouth daily as needed for mild constipation.     [provider]  famotidine (PEPCID) 20 MG tablet Take 20 mg by mouth daily as needed for heartburn or indigestion.    [provider]  gabapentin (NEURONTIN) 100 MG capsule Take 100-200 mg by mouth See admin instructions. Take 100 mg  at night. Take 200 mg after dialysis on Tuesdays, Thursdays,and Saturdays 04/20/19   [provider]  glipiZIDE (GLUCOTROL) 5 MG tablet Take 5 mg by mouth daily.    [provider]  LANTUS SOLOSTAR 100 UNIT/ML Solostar Pen Inject 12 Units into the skin at bedtime.  03/18/19   [provider]  lidocaine-prilocaine (EMLA) cream Apply 1 application topically as needed (port access).    [provider]  lisinopril (ZESTRIL) 5 MG tablet Take 5 mg by mouth daily. 11/11/19   [provider]  nitroGLYCERIN (NITROSTAT) 0.4 MG SL tablet Place 0.4 mg under the tongue every 5 (five) minutes as needed for chest pain.    [provider]  ondansetron (ZOFRAN) 4 MG tablet Take 1 tablet (4 mg total) by mouth every 6 (six) hours. 05/11/19   Alveria Apley, PA-C  promethazine (PHENERGAN) 25 MG suppository Place 1 suppository  (25 mg total) rectally every 6 (six) hours as needed for nausea or vomiting. 04/08/20   Milton Ferguson, MD  Simethicone (GAS-X PO) Take 1 tablet by mouth daily as needed (gas).    [provider]  sodium chloride (OCEAN) 0.65 % SOLN nasal spray Place 1 spray into both nostrils in the morning and at bedtime.    [provider]  tiZANidine (ZANAFLEX) 4 MG tablet Take 4 mg by mouth every 8 (eight) hours.  04/20/19   [provider]    Allergies    Benzonatate, Hydralazine, Amoxicillin, Clonidine, Morphine, and Oxycodone  Review of Systems   Review of Systems  Constitutional: Negative for chills, diaphoresis, fatigue and fever.  HENT: Negative for congestion.   Respiratory: Positive for chest tightness and shortness of breath. Negative for cough, hemoptysis, sputum production, wheezing and stridor.   Cardiovascular: Positive for leg swelling. Negative for chest pain, palpitations and claudication.  Gastrointestinal: Negative for abdominal pain, constipation, diarrhea, nausea and vomiting.  Musculoskeletal: Negative for back pain.  Neurological: Negative for light-headedness and headaches.  Psychiatric/Behavioral: Negative for agitation and confusion.  All other systems reviewed and are negative.   Physical Exam Updated Vital Signs BP (!) 239/105   Pulse 74   Temp 98.2 F (36.8 C) (Oral)   Resp 19   SpO2 100%   Physical Exam Vitals and nursing note reviewed.  Constitutional:      Appearance: She is well-developed. She is ill-appearing.  HENT:     Head: Normocephalic and atraumatic.     Nose: No congestion or rhinorrhea.     Mouth/Throat:     Mouth: Mucous membranes are moist.     Pharynx: No oropharyngeal exudate or posterior oropharyngeal erythema.  Eyes:     Extraocular Movements: Extraocular movements intact.     Conjunctiva/sclera: Conjunctivae normal.     Pupils: Pupils are equal, round, and reactive to light.  Cardiovascular:     Rate and Rhythm:  Normal rate and regular rhythm.     Heart sounds: No murmur heard.   Pulmonary:     Effort: Respiratory distress present.     Breath sounds: Rales present. No rhonchi.  Chest:     Chest wall: No tenderness.  Abdominal:     General: Abdomen is flat.     Palpations: Abdomen is soft.     Tenderness: There is no abdominal tenderness. There is no right CVA tenderness, guarding or rebound.  Musculoskeletal:        General: No tenderness.     Cervical back: Neck supple. No tenderness.  Right lower leg: Edema present.     Left lower leg: Edema present.  Skin:    General: Skin is warm and dry.     Capillary Refill: Capillary refill takes less than 2 seconds.     Findings: No erythema.  Neurological:     General: No focal deficit present.     Mental Status: She is alert.  Psychiatric:        Mood and Affect: Mood normal.     ED Results / Procedures / Treatments   Labs (all labs ordered are listed, but only abnormal results are displayed) Labs Reviewed  CBC WITH DIFFERENTIAL/PLATELET - Abnormal; Notable for the following components:      Result Value   WBC 13.0 (*)    RBC 3.49 (*)    Hemoglobin 10.9 (*)    MCV 103.2 (*)    Neutro Abs 10.4 (*)    Eosinophils Absolute 0.6 (*)    All other components within normal limits  COMPREHENSIVE METABOLIC PANEL - Abnormal; Notable for the following components:   Glucose, Bld 134 (*)    BUN 71 (*)    Creatinine, Ser 14.79 (*)    Calcium 8.4 (*)    GFR, Estimated 3 (*)    All other components within normal limits  I-STAT CHEM 8, ED - Abnormal; Notable for the following components:   BUN 69 (*)    Creatinine, Ser 15.80 (*)    Glucose, Bld 132 (*)    Calcium, Ion 0.87 (*)    Hemoglobin 10.9 (*)    HCT 32.0 (*)    All other components within normal limits  TROPONIN I (HIGH SENSITIVITY) - Abnormal; Notable for the following components:   Troponin I (High Sensitivity) 22 (*)    All other components within normal limits  TROPONIN I  (HIGH SENSITIVITY) - Abnormal; Notable for the following components:   Troponin I (High Sensitivity) 34 (*)    All other components within normal limits  RESP PANEL BY RT-PCR (FLU A&B, COVID) ARPGX2  MRSA PCR SCREENING  HEPATITIS B SURFACE ANTIBODY,QUALITATIVE  HEPATITIS B SURFACE ANTIGEN  GLUCOSE, CAPILLARY  BASIC METABOLIC PANEL  CBC  MAGNESIUM  PHOSPHORUS  HEMOGLOBIN A1C  HEPATITIS B SURFACE ANTIBODY, QUANTITATIVE    EKG EKG Interpretation  Date/Time:  Tuesday Aug 28 2020 15:43:55 EDT Ventricular Rate:  77 PR Interval:  171 QRS Duration: 105 QT Interval:  412 QTC Calculation: 467 R Axis:   67 Text Interpretation: Sinus rhythm Probable left atrial enlargement When compared to prior, similar appearance. NO sTEMI Confirmed by Antony Blackbird [phone]) on 08/29/2020 12:24:29 AM   Radiology IR Fluoro Guide CV Line Left  Result Date: 08/28/2020 INDICATION: 50 year old female with a history of malfunctioning and displaced hemodialysis catheter EXAM: PLACEMENT OF TUNNELED HEMODIALYSIS CATHETER WITH IMAGING GUIDANCE MEDICATIONS: 1 g vancomycin. The antibiotic was given in an appropriate time interval prior to skin puncture. ANESTHESIA/SEDATION: No conscious sedation with lack of IV access. FLUOROSCOPY TIME:  Fluoroscopy Time: 2 minutes 6 seconds (56.5 mGy). COMPLICATIONS: None PROCEDURE: Informed written consent was obtained from the patient after a discussion of the risks, benefits, and alternatives to treatment. Questions regarding the procedure were encouraged and answered. The right neck and chest, including the indwelling catheter were prepped with chlorhexidine in a sterile fashion, and a sterile drape was applied covering the operative field. Maximum barrier sterile technique with sterile gowns and gloves were used for the procedure. A timeout was performed prior to the initiation of the procedure.  Initial imaging of the chest demonstrates that the catheter has been withdrawn 2 above the  clavicle, outside of the internal jugular vein. Gentle mount of contrast injected confirms that the catheter has been withdrawn entirely outside of the vein. The catheter was then removed entirely. Through the tract in angle [place] the catheter was placed. Gentle contrast injected identifies a thin residual tract through the internal jugular vein. We were thus 6s full with advancing a Glidewire through this tract and into the IVC. Once the Glidewire was within the IVC the [place] the catheter was removed. Twelve French dilation of the tract was performed on the Glidewire. We then placed a new 23 cm tip to cuff catheter. Once the catheter was in position, the wire was removed and the introducers were removed. Both of the hubs of the catheter were aspirated and flushed. Final catheter positioning was confirmed and documented with a spot radiographic image. The catheter aspirates and flushes normally. The catheter was flushed with appropriate volume heparin dwells. Catheter was then sutured to the chest. The patient tolerated the procedure well without immediate post procedural complication. IMPRESSION: Status post placement of a right IJ tunneled hemodialysis catheter, replacing a displaced and withdrawn, nonfunctional hemodialysis catheter. Signed, Dulcy Fanny. Dellia Nims, RPVI [facility] Radiology Electronically Signed   By: Corrie Mckusick D.O.   On: 08/28/2020 17:25   DG Chest Portable 1 View  Result Date: 08/28/2020 CLINICAL DATA:  50 year old female with respiratory failure. EXAM: PORTABLE CHEST 1 VIEW COMPARISON:  Chest radiograph dated 08/25/2020. FINDINGS: Right IJ dialysis catheter with tip over the right atrium. There is shallow inspiration. There is cardiomegaly with vascular congestion and edema. Significant worsening of CHF or fluid overload compared to the prior radiograph. Small bilateral pleural effusions and bibasilar atelectasis. Pneumonia is not  excluded. No pneumothorax. No acute osseous pathology. IMPRESSION: Cardiomegaly with significant worsening of CHF or fluid overload compared to the prior radiograph. Electronically Signed   By: Anner Crete M.D.   On: 08/28/2020 16:23    Procedures Procedures   CRITICAL CARE Performed by: Gwenyth Allegra [name] Total critical care time: 45 minutes Critical care time was exclusive of separately billable procedures and treating other patients. Critical care was necessary to treat or prevent imminent or life-threatening deterioration. Critical care was time spent personally by me on the following activities: development of treatment plan with patient and/or surrogate as well as nursing, discussions with consultants, evaluation of patient's response to treatment, examination of patient, obtaining history from patient or surrogate, ordering and performing treatments and interventions, ordering and review of laboratory studies, ordering and review of radiographic studies, pulse oximetry and re-evaluation of patient's condition.   Medications Ordered in ED Medications  nitroGLYCERIN 50 mg in dextrose 5 % 250 mL (0.2 mg/mL) infusion (5 mcg/min Intravenous New Bag/Given 08/28/20 2308)  Chlorhexidine Gluconate Cloth 2 % PADS 6 each (has no administration in time range)  docusate sodium (COLACE) capsule 100 mg (has no administration in time range)  polyethylene glycol (MIRALAX / GLYCOLAX) packet 17 g (has no administration in time range)  heparin injection 5,000 Units (has no administration in time range)  insulin aspart (novoLOG) injection 0-6 Units (0 Units Subcutaneous Not Given 08/28/20 2341)  promethazine (PHENERGAN) 12.5 mg in sodium chloride 0.9 % 50 mL IVPB (has no administration in time range)  Darbepoetin Alfa (ARANESP) injection 25 mcg (has no administration in time range)    ED Course  I have reviewed the triage vital  signs and the nursing notes.  Pertinent labs & imaging results that  were available during my care of the patient were reviewed by me and considered in my medical decision making (see chart for details).    MDM Rules/Calculators/A&P                          Jernei Padillo is a 50 y.o. female with a past medical history significant for ESRD on dialysis TTS, hypertension, hypercholesterolemia, diabetes, morbid obesity, and previous respiratory failure with hypoxia who presents in respiratory distress from IR procedure.  Patient reportedly was having a dialysis catheter placed in her right upper chest and after having the procedure, instead of being discharged started having worsened breathing.  She is normally on 3 L nasal cannula but had to be scheduled for liters and then on arrival escalated to nonrebreather.  She reports that her shortness of breath was not present prior to the procedure and she is denying any chest pain.  She has increased work of breathing and oxygen saturations were getting into the 80s.  Patient had blood pressure elevated to the 99991111 systolic range.  Previous team concerned about flash pulmonary edema related to laying flat for the procedure.  On exam, patient is in respiratory distress.  She is tachypneic but is not tachycardic.  She is afebrile.  She is on nonrebreather and has difficulty speaking.  Her breath sounds had rales in all lung fields with no significant wheezing or rhonchi appreciated.  Chest and back were nontender.  Patient is requesting the BiPAP mask.  She denies any complaints before procedure and scheduled for dialysis tomorrow.  She missed dialysis today because there was a problem with her catheter which is why was being replaced.  Patient has not had dialysis today and only had a partial treatment on Saturday.  Patient started on BiPAP and started on a nitro drip for her likely hypertensive emergency and flash pulmonary edema.  We will get screening labs including a COVID swab.  She denies any other complaints and denies  any chest pain.  Given her symmetric breath sounds, low suspicion for a procedurally related pneumothorax we will get chest x-ray as well.   Will call nephrology now as anticipate she will need dialysis emergently.  She now has the dialysis catheter in place to be able to get it.  Anticipate admission and discussion with critical care based on her response to BiPAP and blood pressure control.    Final Clinical Impression(s) / ED Diagnoses Final diagnoses:  Respiratory distress  Hypertensive emergency  Hypervolemia, unspecified hypervolemia type      Clinical Impression: 1. Respiratory distress   2. Hypertensive emergency   3. Hypervolemia, unspecified hypervolemia type     Disposition: Admit  This note was prepared with assistance of Dragon voice recognition software. Occasional wrong-word or sound-a-like substitutions may have occurred due to the inherent limitations of voice recognition software.     Keyon Winnick, Gwenyth Allegra, MD 08/29/20 [phone]

## 2020-08-28 NOTE — Progress Notes (Signed)
Patient stated unable to lay flat for procedure and needs to transfer herself due to fluid shift. Patient on home oxygen 3L [place]. History of being placed on Cpap and admission. Placed head of bed elevated position of comfort for procedure. Patient became short of breath and oxygen increased 4L [place]. Patient stated cannot be transferred to stretcher and needs to use stool and sit in wheelchair. Patient informed need to be evaluated in ED and verbalized understanding. HR 90 pulse oximetry 94%-96% on 4L [place] Transferred patient to ED and reported given to [employer]

## 2020-08-28 NOTE — H&P (Signed)
NAME:  Courtney Gray, MRN:  [id number], DOB:  Jun 29, 1970, LOS: 0 ADMISSION DATE:  08/28/2020, CONSULTATION DATE:  08/28/2020 REFERRING MD:  Dr. Sherry Ruffing, CHIEF COMPLAINT:  Acute respiratory distress    History of Present Illness:  Courtney Gray is a 50 y.o. female with PMHx significant for ESRD (on HD T/Th/Sat), HTN, HLD, T2DM and morbid obesity who presented for elective tunnel HD cath insertion. Patient developed acute respiratory distress post-procedure. Transferred from IR to the ED for further evaluation.   On ED transfer, patient was noted to have tachypnea and hypertensive crisis with BP 220/96. CXR demonstrated cardiomegaly with worsening CHF vs. fluid overload. Given tachypnea with respiratory distress and concern for volume overload in the setting of ESRD, patient was placed on BiPAP and Nephrology was consulted for emergency dialysis.   PCCM was consulted for admission and further management.  Pertinent  Medical History  ESRD on HD T, TH, Sat HTN HLD Type 2 diabetes Morbid obesity  Significant Hospital Events: Including procedures, antibiotic start and stop dates in addition to other pertinent events   . 5/24 presented for elective exchange of tunneled HD cath and later developed acute respiratory distress felt secondary to volume overload. Placed on BiPAP.  Interim History / Subjective:  As above   Objective   Blood pressure (!) 169/86, pulse 67, temperature 98.2 F (36.8 C), temperature source Oral, resp. rate (!) 22, SpO2 97 %.    FiO2 (%):  [35 %] 35 %  No intake or output data in the 24 hours ending 08/28/20 1724 There were no vitals filed for this visit.  Physical Examination: General: Acutely ill-appearing middle-aged female in mild respiratory distress. Appears anxious. HEENT: [place]/AT, anicteric sclera, PERRL, moist mucous membranes. Neuro: Awake, oriented x 4. Responds to verbal stimuli. Following commands consistently. Moves all 4 extremities  spontaneously. CV: RRR, no m/g/r. PULM: Breathing labored on BiPAP. Lung fields with bilateral crackles. GI: Soft, nontender, nondistended.  Extremities: Bilateral symmetric 2+ pitting LE edema noted. Skin: Warm/dry, no rashes.  Labs/imaging that I have personally reviewed    5/24 CXR with cardiomegaly with significant for worsening CHF or fluid overload.  5/24 Pertinent lab; glucose 134, creatinine 14.79, HS troponin 22, WBC 13.0, Hgb 10.9  Resolved Hospital Problem list     Assessment & Plan:  Acute Hypoxic Respiratory Failure  -In the setting of volume overload, occurring post-HD catheter exchange P: Continue BIPAP Wean FiO2 for sats greater than 90%. Head of bed elevated 30 degrees. Volume removal per HD  Ensure adequate pulmonary hygiene   ESRD  -on iHF T, TH, Sa -Taken off of HD one hour early during last session (5/21) due to catheter issues - S/p tunneled dialysis catheter replacement 5/24 P: Nephrology following  Follow renal function Trend Bmet Avoid nephrotoxins Ensure adequate renal perfusion  HD per nephrology   Hypertensive urgency  Hx of HTN  -Home medications not yet reconciled  P: PRN IV antihypertensives  Continuous telemetry  IV NTG drip, continue during HD, wean as able  Hx of diabetes  P: SSI  CBG q4   Best practice  Diet:  NPO while on BIPAP Pain/Anxiety/Delirium protocol (if indicated): No VAP protocol (if indicated): Not indicated DVT prophylaxis: LMWH GI prophylaxis: PPI Glucose control:  SSI Yes Central venous access:  N/A Arterial line:  N/A Foley:  N/A Mobility:  bed rest  PT consulted: N/A Last date of multidisciplinary goals of care discussion Pending  Code Status:  full code Disposition: ICU  Labs  CBC: Recent Labs  Lab 08/25/20 1551 08/28/20 1538 08/28/20 1604  WBC 11.3* 13.0*  --   NEUTROABS 9.0* 10.4*  --   HGB 10.7* 10.9* 10.9*  HCT 35.2* 36.0 32.0*  MCV 102.9* 103.2*  --   PLT 174 194  --     Basic  Metabolic Panel: Recent Labs  Lab 08/25/20 1551 08/28/20 1538 08/28/20 1604  NA 137 138 137  K 3.1* 4.4 4.3  CL 98 102 106  CO2 29 22  --   GLUCOSE 132* 134* 132*  BUN 33* 71* 69*  CREATININE 7.39* 14.79* 15.80*  CALCIUM 8.4* 8.4*  --    GFR: Estimated Creatinine Clearance: 6.7 mL/min (A) (by C-G formula based on SCr of 15.8 mg/dL (H)). Recent Labs  Lab 08/25/20 1551 08/28/20 1538  WBC 11.3* 13.0*    Liver Function Tests: Recent Labs  Lab 08/28/20 1538  AST 19  ALT 20  ALKPHOS 58  BILITOT 0.9  PROT 7.5  ALBUMIN 3.9   No results for input(s): LIPASE, AMYLASE in the last 168 hours. No results for input(s): AMMONIA in the last 168 hours.  ABG    Component Value Date/Time   TCO2 22 08/28/2020 1604     Coagulation Profile: No results for input(s): INR, PROTIME in the last 168 hours.  Cardiac Enzymes: No results for input(s): CKTOTAL, CKMB, CKMBINDEX, TROPONINI in the last 168 hours.  HbA1C: Hgb A1c MFr Bld  Date/Time Value Ref Range Status  11/28/2019 08:00 PM 8.4 (H) 4.8 - 5.6 % Final    Comment:    (NOTE)         Prediabetes: 5.7 - 6.4         Diabetes: >6.4         Glycemic control for adults with diabetes: <7.0     CBG: No results for input(s): GLUCAP in the last 168 hours.  Review of Systems:   Please see the history of present illness. All other systems reviewed and are negative   Past Medical History:  She,  has a past medical history of Diabetes mellitus without complication ([place]), High cholesterol, Hypertension, and Renal disorder.   Surgical History:   Past Surgical History:  Procedure Laterality Date  . AV FISTULA PLACEMENT     x2, unsuccessful.      Social History:   reports that she has never smoked. She has never used smokeless tobacco. She reports that she does not drink alcohol and does not use drugs.   Family History:  Her family history includes Diabetes in her father and mother.   Allergies Allergies  Allergen  Reactions  . Benzonatate Other (See Comments)    Hallucinations  . Hydralazine Other (See Comments)    Hallucinations  . Amoxicillin Nausea And Vomiting  . Clonidine Other (See Comments)    Altered mental state  . Morphine Itching  . Oxycodone Itching     Home Medications  Prior to Admission medications   Medication Sig Start Date End Date Taking? Authorizing Provider  acetaminophen (TYLENOL) 500 MG tablet Take 1,000 mg by mouth every 6 (six) hours as needed for moderate pain or headache.    [provider]  amLODipine (NORVASC) 10 MG tablet Take 10 mg by mouth daily at 12 noon.  03/27/18   [provider]  aspirin EC 81 MG tablet Take 81 mg by mouth daily.    [provider]  atorvastatin (LIPITOR) 10 MG tablet Take 10 mg by mouth every evening.  03/29/19   [provider]  carvedilol (COREG) 25 MG tablet Take 25 mg by mouth 2 (two) times daily. 04/20/19   [provider]  diphenhydrAMINE (BENADRYL) 25 MG tablet Take 50 mg by mouth daily as needed for allergies.    [provider]  docusate sodium (COLACE) 100 MG capsule Take 100 mg by mouth daily as needed for mild constipation.     [provider]  famotidine (PEPCID) 20 MG tablet Take 20 mg by mouth daily as needed for heartburn or indigestion.    [provider]  gabapentin (NEURONTIN) 100 MG capsule Take 100-200 mg by mouth See admin instructions. Take 100 mg at night. Take 200 mg after dialysis on Tuesdays, Thursdays,and Saturdays 04/20/19   [provider]  glipiZIDE (GLUCOTROL) 5 MG tablet Take 5 mg by mouth daily.    [provider]  LANTUS SOLOSTAR 100 UNIT/ML Solostar Pen Inject 12 Units into the skin at bedtime.  03/18/19   [provider]  lidocaine-prilocaine (EMLA) cream Apply 1 application topically as needed (port access).    [provider]  lisinopril (ZESTRIL) 5 MG tablet Take 5 mg by mouth daily. 11/11/19    [provider]  nitroGLYCERIN (NITROSTAT) 0.4 MG SL tablet Place 0.4 mg under the tongue every 5 (five) minutes as needed for chest pain.    [provider]  ondansetron (ZOFRAN) 4 MG tablet Take 1 tablet (4 mg total) by mouth every 6 (six) hours. 05/11/19   Alveria Apley, PA-C  promethazine (PHENERGAN) 25 MG suppository Place 1 suppository (25 mg total) rectally every 6 (six) hours as needed for nausea or vomiting. 04/08/20   Milton Ferguson, MD  Simethicone (GAS-X PO) Take 1 tablet by mouth daily as needed (gas).    [provider]  sodium chloride (OCEAN) 0.65 % SOLN nasal spray Place 1 spray into both nostrils in the morning and at bedtime.    [provider]  tiZANidine (ZANAFLEX) 4 MG tablet Take 4 mg by mouth every 8 (eight) hours.  04/20/19   [provider]     Critical care time:    Rhae Lerner [facility] 08/28/20 5:57 PM  Please see [email or web address] for pager details.  From 7A-7P if no response, please call [phone] After hours, please call ELink [phone]

## 2020-08-28 NOTE — Progress Notes (Signed)
Pt transported from Dialysis to 2M10 on Bipap without complications.

## 2020-08-28 NOTE — Progress Notes (Addendum)
Pt with episode of syncope around 2030 during tx. Unresponsive and diaphoretic, remained NSR on tele. 59m ns given and nitro drip stopped. Hypotension resolved and pt a/ox4 post interventions.  Pt remained stable throughout rest of treatment with stable vitals. Likely d/t elevated UFR with nitro. ICU RN notified of incident during report. Dialysis completed without further issue. MD D. SAlbanotified and nephrology paged.

## 2020-08-28 NOTE — Consult Note (Signed)
[facility] Renal Consultation Note    Indication for Consultation:  Management of ESRD/hemodialysis; anemia, hypertension/volume and secondary hyperparathyroidism  HPI: Courtney Gray is a 50 y.o. female with a PMH significant for DM, HTN, morbid obesity, and ESRD on HD TTS at [facility] who had her RIJ TDC fall out during HD on 08/25/20.  She was sent to IR for replacement of RIJ [facility] today but then developed acute respiratory distress after the procedure and was transferred to the emergency room.  In the ED she was noted to be hypoxic, tachycardic, and hypertensive.  CXR with cardiomegaly and worsening CHF/volume overload.  She was placed on BiPAP and oxygen with improvement of her acute distress.  We were consulted to provide urgent HD with UF to help with her respiratory status and HTN.  Past Medical History:  Diagnosis Date  . Diabetes mellitus without complication ([place])   . High cholesterol   . Hypertension   . Renal disorder    Past Surgical History:  Procedure Laterality Date  . AV FISTULA PLACEMENT     x2, unsuccessful.   . IR FLUORO GUIDE CV LINE LEFT  08/28/2020   Family History:   Family History  Problem Relation Age of Onset  . Diabetes Mother   . Diabetes Father    Social History:  reports that she has never smoked. She has never used smokeless tobacco. She reports that she does not drink alcohol and does not use drugs. Allergies  Allergen Reactions  . Benzonatate Other (See Comments)    Hallucinations  . Hydralazine Other (See Comments)    Hallucinations  . Amoxicillin Nausea And Vomiting  . Clonidine Other (See Comments)    Altered mental state  . Morphine Itching  . Oxycodone Itching   Prior to Admission medications   Medication Sig Start Date End Date Taking? Authorizing Provider  acetaminophen (TYLENOL) 500 MG tablet Take 1,000 mg by mouth every 6 (six) hours as needed for moderate pain or headache.    [provider]  amLODipine  (NORVASC) 10 MG tablet Take 10 mg by mouth daily at 12 noon.  03/27/18   [provider]  aspirin EC 81 MG tablet Take 81 mg by mouth daily.    [provider]  atorvastatin (LIPITOR) 10 MG tablet Take 10 mg by mouth every evening.  03/29/19   [provider]  carvedilol (COREG) 25 MG tablet Take 25 mg by mouth 2 (two) times daily. 04/20/19   [provider]  diphenhydrAMINE (BENADRYL) 25 MG tablet Take 50 mg by mouth daily as needed for allergies.    [provider]  docusate sodium (COLACE) 100 MG capsule Take 100 mg by mouth daily as needed for mild constipation.     [provider]  famotidine (PEPCID) 20 MG tablet Take 20 mg by mouth daily as needed for heartburn or indigestion.    [provider]  gabapentin (NEURONTIN) 100 MG capsule Take 100-200 mg by mouth See admin instructions. Take 100 mg at night. Take 200 mg after dialysis on Tuesdays, Thursdays,and Saturdays 04/20/19   [provider]  glipiZIDE (GLUCOTROL) 5 MG tablet Take 5 mg by mouth daily.    [provider]  LANTUS SOLOSTAR 100 UNIT/ML Solostar Pen Inject 12 Units into the skin at bedtime.  03/18/19   [provider]  lidocaine-prilocaine (EMLA) cream Apply 1 application topically as needed (port access).    [provider]  lisinopril (ZESTRIL) 5 MG tablet Take  5 mg by mouth daily. 11/11/19   [provider]  nitroGLYCERIN (NITROSTAT) 0.4 MG SL tablet Place 0.4 mg under the tongue every 5 (five) minutes as needed for chest pain.    [provider]  ondansetron (ZOFRAN) 4 MG tablet Take 1 tablet (4 mg total) by mouth every 6 (six) hours. 05/11/19   Alveria Apley, PA-C  promethazine (PHENERGAN) 25 MG suppository Place 1 suppository (25 mg total) rectally every 6 (six) hours as needed for nausea or vomiting. 04/08/20   Milton Ferguson, MD  Simethicone (GAS-X PO) Take 1 tablet by mouth daily as needed (gas).    [provider]  sodium chloride (OCEAN) 0.65 % SOLN nasal spray Place 1 spray into both nostrils in the morning and at bedtime.    [provider]  tiZANidine (ZANAFLEX) 4 MG tablet Take 4 mg by mouth every 8 (eight) hours.  04/20/19   [provider]   Current Facility-Administered Medications  Medication Dose Route Frequency Provider Last Rate Last Admin  . [START ON 08/29/2020] Chlorhexidine Gluconate Cloth 2 % PADS 6 each  6 each Topical Q0600 Edrick Oh, MD      . docusate sodium (COLACE) capsule 100 mg  100 mg Oral BID PRN Candee Furbish, MD      . heparin injection 5,000 Units  5,000 Units Subcutaneous Q8H Candee Furbish, MD      . insulin aspart (novoLOG) injection 0-6 Units  0-6 Units Subcutaneous Q4H Candee Furbish, MD      . nitroGLYCERIN 50 mg in dextrose 5 % 250 mL (0.2 mg/mL) infusion  0-200 mcg/min Intravenous Continuous Tegeler, Gwenyth Allegra, MD 1.5 mL/hr at 08/28/20 1600 5 mcg/min at 08/28/20 1600  . polyethylene glycol (MIRALAX / GLYCOLAX) packet 17 g  17 g Oral Daily PRN Candee Furbish, MD       Current Outpatient Medications  Medication Sig Dispense Refill  . acetaminophen (TYLENOL) 500 MG tablet Take 1,000 mg by mouth every 6 (six) hours as needed for moderate pain or headache.    Marland Kitchen amLODipine (NORVASC) 10 MG tablet Take 10 mg by mouth daily at 12 noon.     Marland Kitchen aspirin EC 81 MG tablet Take 81 mg by mouth daily.    Marland Kitchen atorvastatin (LIPITOR) 10 MG tablet Take 10 mg by mouth every evening.     . carvedilol (COREG) 25 MG tablet Take 25 mg by mouth 2 (two) times daily.    . diphenhydrAMINE (BENADRYL) 25 MG tablet Take 50 mg by mouth daily as needed for allergies.    Marland Kitchen docusate sodium (COLACE) 100 MG capsule Take 100 mg by mouth daily as needed for mild constipation.     . famotidine (PEPCID) 20 MG tablet Take 20 mg by mouth daily as needed for heartburn or indigestion.    . gabapentin (NEURONTIN) 100 MG capsule Take 100-200 mg by mouth See admin  instructions. Take 100 mg at night. Take 200 mg after dialysis on Tuesdays, Thursdays,and Saturdays    . glipiZIDE (GLUCOTROL) 5 MG tablet Take 5 mg by mouth daily.    Marland Kitchen LANTUS SOLOSTAR 100 UNIT/ML Solostar Pen Inject 12 Units into the skin at bedtime.     . lidocaine-prilocaine (EMLA) cream Apply 1 application topically as needed (port access).    Marland Kitchen lisinopril (ZESTRIL) 5 MG tablet Take 5 mg by mouth daily.    . nitroGLYCERIN (NITROSTAT) 0.4 MG SL tablet Place 0.4 mg under the tongue every 5 (five)  minutes as needed for chest pain.    Marland Kitchen ondansetron (ZOFRAN) 4 MG tablet Take 1 tablet (4 mg total) by mouth every 6 (six) hours. 12 tablet 0  . promethazine (PHENERGAN) 25 MG suppository Place 1 suppository (25 mg total) rectally every 6 (six) hours as needed for nausea or vomiting. 12 each 0  . Simethicone (GAS-X PO) Take 1 tablet by mouth daily as needed (gas).    . sodium chloride (OCEAN) 0.65 % SOLN nasal spray Place 1 spray into both nostrils in the morning and at bedtime.    Marland Kitchen tiZANidine (ZANAFLEX) 4 MG tablet Take 4 mg by mouth every 8 (eight) hours.      Facility-Administered Medications Ordered in Other Encounters  Medication Dose Route Frequency Provider Last Rate Last Admin  . heparin sodium (porcine) 1000 UNIT/ML injection           . heparin sodium (porcine) 1000 UNIT/ML injection           . heparin sodium (porcine) 1000 UNIT/ML injection           . lidocaine (PF) (XYLOCAINE) 1 % injection            Labs: Basic Metabolic Panel: Recent Labs  Lab 08/25/20 1551 08/28/20 1538 08/28/20 1604  NA 137 138 137  K 3.1* 4.4 4.3  CL 98 102 106  CO2 29 22  --   GLUCOSE 132* 134* 132*  BUN 33* 71* 69*  CREATININE 7.39* 14.79* 15.80*  CALCIUM 8.4* 8.4*  --    Liver Function Tests: Recent Labs  Lab 08/28/20 1538  AST 19  ALT 20  ALKPHOS 58  BILITOT 0.9  PROT 7.5  ALBUMIN 3.9   No results for input(s): LIPASE, AMYLASE in the last 168 hours. No results for input(s): AMMONIA in  the last 168 hours. CBC: Recent Labs  Lab 08/25/20 1551 08/28/20 1538 08/28/20 1604  WBC 11.3* 13.0*  --   NEUTROABS 9.0* 10.4*  --   HGB 10.7* 10.9* 10.9*  HCT 35.2* 36.0 32.0*  MCV 102.9* 103.2*  --   PLT 174 194  --    Cardiac Enzymes: No results for input(s): CKTOTAL, CKMB, CKMBINDEX, TROPONINI in the last 168 hours. CBG: No results for input(s): GLUCAP in the last 168 hours. Iron Studies: No results for input(s): IRON, TIBC, TRANSFERRIN, FERRITIN in the last 72 hours. Studies/Results: IR Fluoro Guide CV Line Left  Result Date: 08/28/2020 INDICATION: 50 year old female with a history of malfunctioning and displaced hemodialysis catheter EXAM: PLACEMENT OF TUNNELED HEMODIALYSIS CATHETER WITH IMAGING GUIDANCE MEDICATIONS: 1 g vancomycin. The antibiotic was given in an appropriate time interval prior to skin puncture. ANESTHESIA/SEDATION: No conscious sedation with lack of IV access. FLUOROSCOPY TIME:  Fluoroscopy Time: 2 minutes 6 seconds (56.5 mGy). COMPLICATIONS: None PROCEDURE: Informed written consent was obtained from the patient after a discussion of the risks, benefits, and alternatives to treatment. Questions regarding the procedure were encouraged and answered. The right neck and chest, including the indwelling catheter were prepped with chlorhexidine in a sterile fashion, and a sterile drape was applied covering the operative field. Maximum barrier sterile technique with sterile gowns and gloves were used for the procedure. A timeout was performed prior to the initiation of the procedure. Initial imaging of the chest demonstrates that the catheter has been withdrawn 2 above the clavicle, outside of the internal jugular vein. Gentle mount of contrast injected confirms that the catheter has been withdrawn entirely outside of the vein. The catheter was  then removed entirely. Through the tract in angle [place] the catheter was placed. Gentle contrast injected identifies a thin residual  tract through the internal jugular vein. We were thus 6s full with advancing a Glidewire through this tract and into the IVC. Once the Glidewire was within the IVC the [place] the catheter was removed. Twelve French dilation of the tract was performed on the Glidewire. We then placed a new 23 cm tip to cuff catheter. Once the catheter was in position, the wire was removed and the introducers were removed. Both of the hubs of the catheter were aspirated and flushed. Final catheter positioning was confirmed and documented with a spot radiographic image. The catheter aspirates and flushes normally. The catheter was flushed with appropriate volume heparin dwells. Catheter was then sutured to the chest. The patient tolerated the procedure well without immediate post procedural complication. IMPRESSION: Status post placement of a right IJ tunneled hemodialysis catheter, replacing a displaced and withdrawn, nonfunctional hemodialysis catheter. Signed, Dulcy Fanny. Dellia Nims, RPVI [facility] Radiology Electronically Signed   By: Corrie Mckusick D.O.   On: 08/28/2020 17:25   DG Chest Portable 1 View  Result Date: 08/28/2020 CLINICAL DATA:  50 year old female with respiratory failure. EXAM: PORTABLE CHEST 1 VIEW COMPARISON:  Chest radiograph dated 08/25/2020. FINDINGS: Right IJ dialysis catheter with tip over the right atrium. There is shallow inspiration. There is cardiomegaly with vascular congestion and edema. Significant worsening of CHF or fluid overload compared to the prior radiograph. Small bilateral pleural effusions and bibasilar atelectasis. Pneumonia is not excluded. No pneumothorax. No acute osseous pathology. IMPRESSION: Cardiomegaly with significant worsening of CHF or fluid overload compared to the prior radiograph. Electronically Signed   By: Anner Crete M.D.   On: 08/28/2020 16:23    ROS: Pertinent items are noted in HPI. Physical Exam: Vitals:    08/28/20 1645 08/28/20 1723 08/28/20 1745 08/28/20 1806  BP: (!) 169/86 (!) 175/84 (!) 176/74   Pulse:  (!) 58 63   Resp: (!) 22 15 (!) 21 17  Temp:   98 F (36.7 C) 97.7 F (36.5 C)  TempSrc:   Tympanic Axillary  SpO2:  96% 93%       Weight change:  No intake or output data in the 24 hours ending 08/28/20 1816 BP (!) 176/74 (BP Location: Right Arm)   Pulse 63   Temp 97.7 F (36.5 C) (Axillary)   Resp 17   SpO2 93%  General appearance: alert, cooperative, no distress, morbidly obese and wearing BiPAP Head: Normocephalic, without obvious abnormality, atraumatic Eyes: negative findings: lids and lashes normal, conjunctivae and sclerae normal and corneas clear Resp: rales bibasilar and rhonchi bibasilar Cardio: regular rate and rhythm and faint HS, no rub GI: soft, non-tender; bowel sounds normal; no masses,  no organomegaly Extremities: edema 1+ bilateral lower extremity pitting edema Dialysis Access:  Dialysis Orders: Center: [name]  on TTS . EDW 169 kg HD Bath 1K/2.5Ca  Time 4 hours Heparin 2600 unit bolus then 600 units/hr. Access RIJ TDC BFR 400 DFR 500    Hectoral 5 mcg IV/HD Epogen 7000   Units IV/HD    Other cinacalcet 30 mg every HD  Assessment/Plan: 1.  acute hypoxic respiratory distress - presumably due to volume overload.  Plan for urgent HD with UF and follow response. 2.  ESRD -  For urgent HD as above. 3.  Hypertension/volume  - had hypertensive urgency in ED after procedure.  Will UF and  follow BP.  May need another session of HD tomorrow. 4.  Anemia  - stable.  Will give ESA 5.  Metabolic bone disease -  Resume outpatient meds 6.  Nutrition -  Renal diet, carb modified 7. Disposition - pending response to HD and UF.  Will be admitted to SDU/ICU and PCCM following.   Donetta Potts, MD [place] Pager [phone] 08/28/2020, 6:16 PM

## 2020-08-28 NOTE — Procedures (Signed)
Interventional Radiology Procedure Note  Procedure: Placement of a right IJ approach tunneled HD catheter.  23cm tip to cuff.   Tip is positioned at the superior cavoatrial junction and catheter is ready for immediate use.  Complications: None Recommendations:  - Ok to use - Do not submerge - Routine line care   Signed,  Dulcy Fanny. Earleen Newport, DO

## 2020-08-28 NOTE — ED Triage Notes (Signed)
Pt here from IR with resp distress. Pt with audible crackles, labored breathing. Dialysis pt, T, TH, Sat, last dialysis Saturday was cut 1 hour short.

## 2020-08-28 NOTE — Progress Notes (Signed)
Pt transported to hemodialysis on BIPAP. No complications noted.

## 2020-08-29 ENCOUNTER — Encounter (HOSPITAL_COMMUNITY): Payer: Self-pay

## 2020-08-29 DIAGNOSIS — R0603 Acute respiratory distress: Secondary | ICD-10-CM

## 2020-08-29 DIAGNOSIS — I161 Hypertensive emergency: Secondary | ICD-10-CM | POA: Diagnosis not present

## 2020-08-29 LAB — BASIC METABOLIC PANEL
Anion gap: 15 (ref 5–15)
BUN: 34 mg/dL — ABNORMAL HIGH (ref 6–20)
CO2: 23 mmol/L (ref 22–32)
Calcium: 8.8 mg/dL — ABNORMAL LOW (ref 8.9–10.3)
Chloride: 99 mmol/L (ref 98–111)
Creatinine, Ser: 8.43 mg/dL — ABNORMAL HIGH (ref 0.44–1.00)
GFR, Estimated: 5 mL/min — ABNORMAL LOW (ref >60)
Glucose, Bld: 78 mg/dL (ref 70–99)
Potassium: 3.9 mmol/L (ref 3.5–5.1)
Sodium: 137 mmol/L (ref 135–145)

## 2020-08-29 LAB — MRSA PCR SCREENING: MRSA by PCR: NEGATIVE

## 2020-08-29 LAB — RENAL FUNCTION PANEL
Albumin: 3.3 g/dL — ABNORMAL LOW (ref 3.5–5.0)
Anion gap: 12 (ref 5–15)
BUN: 40 mg/dL — ABNORMAL HIGH (ref 6–20)
CO2: 25 mmol/L (ref 22–32)
Calcium: 7.9 mg/dL — ABNORMAL LOW (ref 8.9–10.3)
Chloride: 96 mmol/L — ABNORMAL LOW (ref 98–111)
Creatinine, Ser: 9.48 mg/dL — ABNORMAL HIGH (ref 0.44–1.00)
GFR, Estimated: 5 mL/min — ABNORMAL LOW (ref >60)
Glucose, Bld: 90 mg/dL (ref 70–99)
Phosphorus: 3.7 mg/dL (ref 2.5–4.6)
Potassium: 3.4 mmol/L — ABNORMAL LOW (ref 3.5–5.1)
Sodium: 133 mmol/L — ABNORMAL LOW (ref 135–145)

## 2020-08-29 LAB — GLUCOSE, CAPILLARY
Glucose-Capillary: 101 mg/dL — ABNORMAL HIGH (ref 70–99)
Glucose-Capillary: 66 mg/dL — ABNORMAL LOW (ref 70–99)
Glucose-Capillary: 105 mg/dL — ABNORMAL HIGH (ref 70–99)
Glucose-Capillary: 63 mg/dL — ABNORMAL LOW (ref 70–99)
Glucose-Capillary: 81 mg/dL (ref 70–99)

## 2020-08-29 LAB — CBC
HCT: 35.8 % — ABNORMAL LOW (ref 36.0–46.0)
HCT: 28.6 % — ABNORMAL LOW (ref 36.0–46.0)
Hemoglobin: 11.4 g/dL — ABNORMAL LOW (ref 12.0–15.0)
Hemoglobin: 8.8 g/dL — ABNORMAL LOW (ref 12.0–15.0)
MCH: 32 pg (ref 26.0–34.0)
MCH: 31.3 pg (ref 26.0–34.0)
MCHC: 31.8 g/dL (ref 30.0–36.0)
MCHC: 30.8 g/dL (ref 30.0–36.0)
MCV: 100.6 fL — ABNORMAL HIGH (ref 80.0–100.0)
MCV: 101.8 fL — ABNORMAL HIGH (ref 80.0–100.0)
Platelets: 190 10*3/uL (ref 150–400)
Platelets: 151 10*3/uL (ref 150–400)
RBC: 3.56 MIL/uL — ABNORMAL LOW (ref 3.87–5.11)
RBC: 2.81 MIL/uL — ABNORMAL LOW (ref 3.87–5.11)
RDW: 13.2 % (ref 11.5–15.5)
RDW: 12.9 % (ref 11.5–15.5)
WBC: 16.2 10*3/uL — ABNORMAL HIGH (ref 4.0–10.5)
WBC: 11.3 10*3/uL — ABNORMAL HIGH (ref 4.0–10.5)
nRBC: 0 % (ref 0.0–0.2)
nRBC: 0 % (ref 0.0–0.2)

## 2020-08-29 LAB — MAGNESIUM: Magnesium: 2.3 mg/dL (ref 1.7–2.4)

## 2020-08-29 LAB — HEPATITIS B SURFACE ANTIBODY, QUANTITATIVE: Hep B S AB Quant (Post): 5.9 m[IU]/mL — ABNORMAL LOW (ref >9.9)

## 2020-08-29 LAB — PHOSPHORUS: Phosphorus: 3.6 mg/dL (ref 2.5–4.6)

## 2020-08-29 LAB — TROPONIN I (HIGH SENSITIVITY): Troponin I (High Sensitivity): 34 ng/L — ABNORMAL HIGH (ref <18)

## 2020-08-29 MED ORDER — SALINE SPRAY 0.65 % NA SOLN
1.0000 | Freq: Two times a day (BID) | NASAL | Status: DC
  Administered 2020-08-29 – 2020-08-30 (×3): 1 via NASAL
  Filled 2020-08-29: qty 44

## 2020-08-29 MED ORDER — CHLORHEXIDINE GLUCONATE CLOTH 2 % EX PADS
6.0000 | MEDICATED_PAD | Freq: Every day | CUTANEOUS | Status: DC
  Administered 2020-08-31: 6 via TOPICAL

## 2020-08-29 MED ORDER — NICARDIPINE HCL IN NACL 20-0.86 MG/200ML-% IV SOLN
3.0000 mg/h | INTRAVENOUS | Status: DC
  Filled 2020-08-29: qty 200

## 2020-08-29 MED ORDER — LIDOCAINE-PRILOCAINE 2.5-2.5 % EX CREA: 1.0000 "application " | TOPICAL_CREAM | CUTANEOUS | Status: DC | PRN

## 2020-08-29 MED ORDER — LIDOCAINE HCL (PF) 1 % IJ SOLN: 5.0000 mL | INTRAMUSCULAR | Status: DC | PRN

## 2020-08-29 MED ORDER — LISINOPRIL 5 MG PO TABS
5.0000 mg | ORAL_TABLET | Freq: Every day | ORAL | Status: DC
  Administered 2020-08-29 – 2020-08-31 (×3): 5 mg via ORAL
  Filled 2020-08-29 (×4): qty 1

## 2020-08-29 MED ORDER — GABAPENTIN 100 MG PO CAPS
100.0000 mg | ORAL_CAPSULE | Freq: Three times a day (TID) | ORAL | Status: DC
  Administered 2020-08-29 – 2020-08-31 (×6): 100 mg via ORAL
  Filled 2020-08-29 (×7): qty 1

## 2020-08-29 MED ORDER — LIDOCAINE-PRILOCAINE 2.5-2.5 % EX CREA
1.0000 "application " | TOPICAL_CREAM | CUTANEOUS | Status: DC | PRN
  Filled 2020-08-29: qty 5

## 2020-08-29 MED ORDER — GABAPENTIN 100 MG PO CAPS: 100.0000 mg | ORAL_CAPSULE | Freq: Every day | ORAL | Status: DC

## 2020-08-29 MED ORDER — DOXERCALCIFEROL 4 MCG/2ML IV SOLN
5.0000 ug | INTRAVENOUS | Status: DC
  Filled 2020-08-29: qty 4

## 2020-08-29 MED ORDER — SODIUM CHLORIDE 0.9 % IV SOLN: 100.0000 mL | INTRAVENOUS | Status: DC | PRN

## 2020-08-29 MED ORDER — INSULIN ASPART 100 UNIT/ML IJ SOLN
0.0000 [IU] | Freq: Three times a day (TID) | INTRAMUSCULAR | Status: DC
  Administered 2020-08-31: 2 [IU] via SUBCUTANEOUS
  Administered 2020-08-31: 3 [IU] via SUBCUTANEOUS

## 2020-08-29 MED ORDER — DEXTROSE 50 % IV SOLN
INTRAVENOUS | Status: AC
  Administered 2020-08-29: 12.5 g via INTRAVENOUS
  Filled 2020-08-29: qty 50

## 2020-08-29 MED ORDER — TIZANIDINE HCL 2 MG PO TABS
4.0000 mg | ORAL_TABLET | Freq: Three times a day (TID) | ORAL | Status: DC
  Administered 2020-08-29 – 2020-08-31 (×7): 4 mg via ORAL
  Filled 2020-08-29: qty 2
  Filled 2020-08-29 (×3): qty 1
  Filled 2020-08-29: qty 2
  Filled 2020-08-29 (×4): qty 1

## 2020-08-29 MED ORDER — DEXTROSE 50 % IV SOLN
12.5000 g | INTRAVENOUS | Status: AC
  Administered 2020-08-29: 12.5 g via INTRAVENOUS

## 2020-08-29 MED ORDER — CARVEDILOL 25 MG PO TABS
25.0000 mg | ORAL_TABLET | Freq: Two times a day (BID) | ORAL | Status: DC
  Administered 2020-08-29 – 2020-08-31 (×4): 25 mg via ORAL
  Filled 2020-08-29 (×5): qty 1

## 2020-08-29 MED ORDER — SIMETHICONE 80 MG PO CHEW
80.0000 mg | CHEWABLE_TABLET | Freq: Four times a day (QID) | ORAL | Status: DC | PRN
  Filled 2020-08-29: qty 1

## 2020-08-29 MED ORDER — NITROGLYCERIN IN D5W 200-5 MCG/ML-% IV SOLN
0.0000 ug/min | INTRAVENOUS | Status: DC
  Administered 2020-08-29: 5 ug/min via INTRAVENOUS
  Administered 2020-08-29: 100 ug/min via INTRAVENOUS
  Filled 2020-08-29: qty 250

## 2020-08-29 MED ORDER — CINACALCET HCL 30 MG PO TABS
30.0000 mg | ORAL_TABLET | ORAL | Status: DC
  Filled 2020-08-29: qty 1

## 2020-08-29 MED ORDER — ACETAMINOPHEN 325 MG PO TABS
650.0000 mg | ORAL_TABLET | Freq: Four times a day (QID) | ORAL | Status: DC | PRN
  Administered 2020-08-29 – 2020-08-30 (×2): 650 mg via ORAL
  Filled 2020-08-29 (×2): qty 2

## 2020-08-29 MED ORDER — ALTEPLASE 2 MG IJ SOLR
2.0000 mg | Freq: Once | INTRAMUSCULAR | Status: DC | PRN
  Filled 2020-08-29: qty 2

## 2020-08-29 MED ORDER — CHLORHEXIDINE GLUCONATE CLOTH 2 % EX PADS
6.0000 | MEDICATED_PAD | Freq: Every day | CUTANEOUS | Status: DC
  Administered 2020-08-29 – 2020-08-31 (×2): 6 via TOPICAL

## 2020-08-29 MED ORDER — HEPARIN SODIUM (PORCINE) 1000 UNIT/ML DIALYSIS: 1000.0000 [IU] | INTRAMUSCULAR | Status: DC | PRN

## 2020-08-29 MED ORDER — AMLODIPINE BESYLATE 10 MG PO TABS
10.0000 mg | ORAL_TABLET | Freq: Every day | ORAL | Status: DC
  Administered 2020-08-29 – 2020-08-31 (×2): 10 mg via ORAL
  Filled 2020-08-29 (×4): qty 1

## 2020-08-29 MED ORDER — GABAPENTIN 100 MG PO CAPS: 100.0000 mg | ORAL_CAPSULE | ORAL | Status: DC

## 2020-08-29 MED ORDER — NITROGLYCERIN 0.4 MG SL SUBL: 0.4000 mg | SUBLINGUAL_TABLET | SUBLINGUAL | Status: DC | PRN

## 2020-08-29 MED ORDER — ASPIRIN EC 81 MG PO TBEC
81.0000 mg | DELAYED_RELEASE_TABLET | Freq: Every day | ORAL | Status: DC
  Administered 2020-08-29 – 2020-08-31 (×3): 81 mg via ORAL
  Filled 2020-08-29 (×4): qty 1

## 2020-08-29 MED ORDER — DOCUSATE SODIUM 100 MG PO CAPS: 100.0000 mg | ORAL_CAPSULE | Freq: Every day | ORAL | Status: DC | PRN

## 2020-08-29 MED ORDER — ATORVASTATIN CALCIUM 10 MG PO TABS
10.0000 mg | ORAL_TABLET | Freq: Every evening | ORAL | Status: DC
  Administered 2020-08-29 – 2020-08-30 (×2): 10 mg via ORAL
  Filled 2020-08-29 (×3): qty 1

## 2020-08-29 MED ORDER — PENTAFLUOROPROP-TETRAFLUOROETH EX AERO: 1.0000 "application " | INHALATION_SPRAY | CUTANEOUS | Status: DC | PRN

## 2020-08-29 MED ORDER — INSULIN GLARGINE 100 UNIT/ML ~~LOC~~ SOLN
12.0000 [IU] | Freq: Every day | SUBCUTANEOUS | Status: DC
  Filled 2020-08-29: qty 0.12

## 2020-08-29 MED ORDER — FAMOTIDINE 20 MG PO TABS: 20.0000 mg | ORAL_TABLET | Freq: Every day | ORAL | Status: DC | PRN

## 2020-08-29 MED ORDER — GLIPIZIDE 5 MG PO TABS
5.0000 mg | ORAL_TABLET | Freq: Every day | ORAL | Status: DC
  Administered 2020-08-29: 5 mg via ORAL
  Filled 2020-08-29: qty 1

## 2020-08-29 NOTE — Progress Notes (Signed)
NAME:  Courtney Gray, MRN:  [id number], DOB:  April 23, 1970, LOS: 1 ADMISSION DATE:  08/28/2020, CONSULTATION DATE:  08/28/2020 REFERRING MD:  Dr. Sherry Ruffing, CHIEF COMPLAINT:  Acute respiratory distress    History of Present Illness:   50 year old woman with hx of ESRD on HD TTS, HTN, HLD, T2DM and morbid obesity, presenting with hypertensive emergency and respiratory failure.  Has been having access issues and had revision of  Now on BIPAP and nitroglycerin drip feeling slightly better but still very dyspneic.  Receievd urgent HD 5/24 and started on nitro gtt for BP control.  Pertinent  Medical History   ESRD on HD T, TH, Sat HTN HLD Type 2 diabetes Morbid obesity  Significant Hospital Events: Including procedures, antibiotic start and stop dates in addition to other pertinent events    5/24 presented for elective exchange of tunneled HD cath and later developed acute respiratory distress felt secondary to volume overload. Placed on BiPAP.   Interim History / Subjective:  Patient is feeling better. SOB has improved. Has no complaint.   Objective   Blood pressure (!) 156/143, pulse 67, temperature 98.2 F (36.8 C), temperature source Axillary, resp. rate 17, SpO2 98 %.    FiO2 (%):  [35 %] 35 %   Intake/Output Summary (Last 24 hours) at 08/29/2020 0739 Last data filed at 08/29/2020 0700 Gross per 24 hour  Intake 211.32 ml  Output 3115 ml  Net -2903.68 ml   There were no vitals filed for this visit.  Examination: Gen:      Obese lady. No acute distress HEENT:  EOMI, sclera anicteric Lungs:    On [place], bibasilar fine crackles bilaterally; normal respiratory effort CV:         Regular rate and rhythm; no murmurs Abd:      + bowel sounds; soft, non-tender; no palpable masses, no distension Ext:    2-3+ bl pitting edema; adequate peripheral perfusion Skin:      Warm and dry; no rash Neuro:    Alert and oriented x 3. Moves all of the extremities, follows commands and answers all  of the questions appropriately   Labs/imaging that I havepersonally reviewed  (right click and "Reselect all SmartList Selections" daily)   BUN/Cr: 34 / 8.43 Hb 11.4 Resolved Hospital Problem list     Assessment & Plan:   Acute hypoxemic respiratory failure due to hypertensive urgency and volume overload.   Dry weight 154kg, w today is 163  -Now off of Bipap -S/p urgent HD last night. Will do another HD today per nephrology -On Nitro drip 120. Resuming home antihypertensive meds -SBP goal<160 -Starting diet  DM2  Class 3 obesity   CBG (last 3)  Recent Labs    08/28/20 2325  GLUCAP 71   -On ssi -Starting diet -Monitor CBG  Best practice (right click and "Reselect all SmartList Selections" daily)  Diet:  Oral Pain/Anxiety/Delirium protocol (if indicated): No VAP protocol (if indicated): Not indicated DVT prophylaxis: LMWH GI prophylaxis: PPI Glucose control:  SSI Yes Central venous access:  N/A Arterial line:  N/A Foley:  N/A Mobility:  bed rest  PT consulted: N/A Last date of multidisciplinary goals of care discussion '[]'$  Code Status:  full code Disposition: ICU  Labs   CBC: Recent Labs  Lab 08/25/20 1551 08/28/20 1538 08/28/20 1604 08/28/20 2310  WBC 11.3* 13.0*  --  16.2*  NEUTROABS 9.0* 10.4*  --   --   HGB 10.7* 10.9* 10.9* 11.4*  HCT 35.2*  36.0 32.0* 35.8*  MCV 102.9* 103.2*  --  100.6*  PLT 174 194  --  99991111    Basic Metabolic Panel: Recent Labs  Lab 08/25/20 1551 08/28/20 1538 08/28/20 1604 08/28/20 2310  NA 137 138 137 137  K 3.1* 4.4 4.3 3.9  CL 98 102 106 99  CO2 29 22  --  23  GLUCOSE 132* 134* 132* 78  BUN 33* 71* 69* 34*  CREATININE 7.39* 14.79* 15.80* 8.43*  CALCIUM 8.4* 8.4*  --  8.8*  MG  --   --   --  2.3  PHOS  --   --   --  3.6   GFR: Estimated Creatinine Clearance: 12.5 mL/min (A) (by C-G formula based on SCr of 8.43 mg/dL (H)). Recent Labs  Lab 08/25/20 1551 08/28/20 1538 08/28/20 2310  WBC 11.3* 13.0*  16.2*    Liver Function Tests: Recent Labs  Lab 08/28/20 1538  AST 19  ALT 20  ALKPHOS 58  BILITOT 0.9  PROT 7.5  ALBUMIN 3.9   No results for input(s): LIPASE, AMYLASE in the last 168 hours. No results for input(s): AMMONIA in the last 168 hours.  ABG    Component Value Date/Time   TCO2 22 08/28/2020 1604     Coagulation Profile: No results for input(s): INR, PROTIME in the last 168 hours.  Cardiac Enzymes: No results for input(s): CKTOTAL, CKMB, CKMBINDEX, TROPONINI in the last 168 hours.  HbA1C: Hgb A1c MFr Bld  Date/Time Value Ref Range Status  11/28/2019 08:00 PM 8.4 (H) 4.8 - 5.6 % Final    Comment:    (NOTE)         Prediabetes: 5.7 - 6.4         Diabetes: >6.4         Glycemic control for adults with diabetes: <7.0     CBG: Recent Labs  Lab 08/28/20 2325  GLUCAP 71    Review of Systems:   SOB (improved) Cough  Past Medical History:  She,  has a past medical history of Diabetes mellitus without complication ([place]), High cholesterol, Hypertension, and Renal disorder.   Surgical History:   Past Surgical History:  Procedure Laterality Date  . AV FISTULA PLACEMENT     x2, unsuccessful.   . IR FLUORO GUIDE CV LINE RIGHT  08/28/2020     Social History:   reports that she has never smoked. She has never used smokeless tobacco. She reports that she does not drink alcohol and does not use drugs.   Family History:  Her family history includes Diabetes in her father and mother.   Allergies Allergies  Allergen Reactions  . Benzonatate Other (See Comments)    Hallucinations  . Hydralazine Other (See Comments)    Hallucinations  . Amoxicillin Nausea And Vomiting  . Clonidine Other (See Comments)    Altered mental state  . Morphine Itching  . Oxycodone Itching     Home Medications  Prior to Admission medications   Medication Sig Start Date End Date Taking? Authorizing Provider  acetaminophen (TYLENOL) 500 MG tablet Take 1,000 mg by mouth  every 6 (six) hours as needed for moderate pain or headache.    [provider]  amLODipine (NORVASC) 10 MG tablet Take 10 mg by mouth daily at 12 noon.  03/27/18   [provider]  aspirin EC 81 MG tablet Take 81 mg by mouth daily.    [provider]  atorvastatin (LIPITOR) 10 MG tablet Take 10  mg by mouth every evening.  03/29/19   [provider]  carvedilol (COREG) 25 MG tablet Take 25 mg by mouth 2 (two) times daily. 04/20/19   [provider]  diphenhydrAMINE (BENADRYL) 25 MG tablet Take 50 mg by mouth daily as needed for allergies.    [provider]  docusate sodium (COLACE) 100 MG capsule Take 100 mg by mouth daily as needed for mild constipation.     [provider]  famotidine (PEPCID) 20 MG tablet Take 20 mg by mouth daily as needed for heartburn or indigestion.    [provider]  gabapentin (NEURONTIN) 100 MG capsule Take 100-200 mg by mouth See admin instructions. Take 100 mg at night. Take 200 mg after dialysis on Tuesdays, Thursdays,and Saturdays 04/20/19   [provider]  glipiZIDE (GLUCOTROL) 5 MG tablet Take 5 mg by mouth daily.    [provider]  LANTUS SOLOSTAR 100 UNIT/ML Solostar Pen Inject 12 Units into the skin at bedtime.  03/18/19   [provider]  lidocaine-prilocaine (EMLA) cream Apply 1 application topically as needed (port access).    [provider]  lisinopril (ZESTRIL) 5 MG tablet Take 5 mg by mouth daily. 11/11/19   [provider]  nitroGLYCERIN (NITROSTAT) 0.4 MG SL tablet Place 0.4 mg under the tongue every 5 (five) minutes as needed for chest pain.    [provider]  ondansetron (ZOFRAN) 4 MG tablet Take 1 tablet (4 mg total) by mouth every 6 (six) hours. 05/11/19   Alveria Apley, PA-C  promethazine (PHENERGAN) 25 MG suppository Place 1 suppository (25 mg total) rectally every 6 (six) hours as needed for nausea or vomiting. 04/08/20    Milton Ferguson, MD  Simethicone (GAS-X PO) Take 1 tablet by mouth daily as needed (gas).    [provider]  sodium chloride (OCEAN) 0.65 % SOLN nasal spray Place 1 spray into both nostrils in the morning and at bedtime.    [provider]  tiZANidine (ZANAFLEX) 4 MG tablet Take 4 mg by mouth every 8 (eight) hours.  04/20/19   [provider]     Critical care time: Dewayne Hatch, MD [id number] 08/29/2020, 7:58 AM Pager: [phone]

## 2020-08-29 NOTE — Progress Notes (Signed)
Patient completed 2.45 hours of a 3.5 hour treatment. She requested to end treatment due to having it scheduled already for tomorrow. Alerted on call MD floor RN aware.

## 2020-08-29 NOTE — Progress Notes (Signed)
[facility] Progress Note  Name: Catheryn Younis MRN: [id number] DOB: 24-Apr-1970   Subjective:  had HD overnight with 3.1 kg UF. Feels ok this am but states breathing is only about "70%" which states is better but not great. She has been on nitro gtt at 120 mg/min. States they normally pull 4 kg.  States AVF placed 3 years ago and hasn't worked.   Review of systems:  Reports shortness of breath though improved   ---------- Background on consult:  Lindalou Masloski is a 50 y.o. female with a PMH significant for DM, HTN, morbid obesity, and ESRD on HD TTS at [facility] who had her RIJ TDC fall out during HD on 08/25/20.  She was sent to IR for replacement of RIJ [facility] today but then developed acute respiratory distress after the procedure and was transferred to the emergency room.  In the ED she was noted to be hypoxic, tachycardic, and hypertensive.  CXR with cardiomegaly and worsening CHF/volume overload.  She was placed on BiPAP and oxygen with improvement of her acute distress.  We were consulted to provide urgent HD with UF to help with her respiratory status and HTN.   Intake/Output Summary (Last 24 hours) at 08/29/2020 0740 Last data filed at 08/29/2020 0700 Gross per 24 hour  Intake 211.32 ml  Output 3115 ml  Net -2903.68 ml    Vitals:  Vitals:   08/29/20 0645 08/29/20 0700 08/29/20 0715 08/29/20 0730  BP: (!) 178/72 (!) 187/82 (!) 182/85 (!) 156/143  Pulse: 67 66 66 67  Resp: '19 13 17 17  '$ Temp:      TempSrc:      SpO2: 96% 95% 96% 98%     Physical Exam:  General adult female in bed in no acute distress HEENT normocephalic atraumatic extraocular movements intact sclera anicteric Neck supple trachea midline Lungs clear to auscultation bilaterally normal work of breathing at rest and inc with exertion  Heart S1S2 no rub Abdomen soft nontender nondistended Extremities 2+ edema  Psych normal mood and affect Neuro alert and oriented x 3 provides hx and follows  commands Access RIJ nontunneled catheter in place; right AVF bruit no thrill   Medications reviewed   Labs:  BMP Latest Ref Rng & Units 08/28/2020 08/28/2020 08/28/2020  Glucose 70 - 99 mg/dL 78 132(H) 134(H)  BUN 6 - 20 mg/dL 34(H) 69(H) 71(H)  Creatinine 0.44 - 1.00 mg/dL 8.43(H) 15.80(H) 14.79(H)  Sodium 135 - 145 mmol/L 137 137 138  Potassium 3.5 - 5.1 mmol/L 3.9 4.3 4.4  Chloride 98 - 111 mmol/L 99 106 102  CO2 22 - 32 mmol/L 23 - 22  Calcium 8.9 - 10.3 mg/dL 8.8(L) - 8.4(L)    HD Center: [facility]  on TTS   (these may be from old orders as patient states EDW 167.5 kg.   EDW 167.5 kg  1K/2.5 Ca Heparin 2600 units bolus then 600 unit hourly 4 hours  BF 400  DF 500 RIJ catheter Last post weight - 5/21 169.4 kg  Patient prefers to keep BP elevated per HD charge RN (pt thinks 150 is low); struggle  Hectoral 5 mcg IV/HD Epogen 7000 Units IV/HD    Other cinacalcet 30 mg every HD   Assessment/Plan:   1.  Acute hypoxic respiratory distress - presumably due to volume overload.  Plan for urgent HD with UF and follow response.  2.  ESRD - HD today to optimize volume HTN emergency then resume HD per TTS schedule.  Late PM 5/24 labs ok.  If still on nitro gtt will need to be done in ICU  3. HTN - HTN urgency in ER after procedure. S/p UF with HD.  On nitro gtt currently and team is restarting her meds  4. Anemia CKD - defer ESA at this time.  Hb 11.4  5. Metabolic bone disease sensipar with HD; continue. Continue hectorol while here.   Disposition - she is in ICU.  On nitro gtt    Claudia Desanctis, MD 08/29/2020 8:10 AM

## 2020-08-30 DIAGNOSIS — I161 Hypertensive emergency: Secondary | ICD-10-CM | POA: Diagnosis not present

## 2020-08-30 LAB — GLUCOSE, CAPILLARY
Glucose-Capillary: 95 mg/dL (ref 70–99)
Glucose-Capillary: 91 mg/dL (ref 70–99)
Glucose-Capillary: 77 mg/dL (ref 70–99)
Glucose-Capillary: 67 mg/dL — ABNORMAL LOW (ref 70–99)
Glucose-Capillary: 81 mg/dL (ref 70–99)
Glucose-Capillary: 142 mg/dL — ABNORMAL HIGH (ref 70–99)
Glucose-Capillary: 108 mg/dL — ABNORMAL HIGH (ref 70–99)
Glucose-Capillary: 174 mg/dL — ABNORMAL HIGH (ref 70–99)

## 2020-08-30 LAB — BASIC METABOLIC PANEL
Anion gap: 11 (ref 5–15)
BUN: 25 mg/dL — ABNORMAL HIGH (ref 6–20)
CO2: 27 mmol/L (ref 22–32)
Calcium: 8.2 mg/dL — ABNORMAL LOW (ref 8.9–10.3)
Chloride: 95 mmol/L — ABNORMAL LOW (ref 98–111)
Creatinine, Ser: 7.63 mg/dL — ABNORMAL HIGH (ref 0.44–1.00)
GFR, Estimated: 6 mL/min — ABNORMAL LOW (ref >60)
Glucose, Bld: 78 mg/dL (ref 70–99)
Potassium: 3.6 mmol/L (ref 3.5–5.1)
Sodium: 133 mmol/L — ABNORMAL LOW (ref 135–145)

## 2020-08-30 LAB — CBC
HCT: 30 % — ABNORMAL LOW (ref 36.0–46.0)
Hemoglobin: 9.3 g/dL — ABNORMAL LOW (ref 12.0–15.0)
MCH: 31.2 pg (ref 26.0–34.0)
MCHC: 31 g/dL (ref 30.0–36.0)
MCV: 100.7 fL — ABNORMAL HIGH (ref 80.0–100.0)
Platelets: 158 10*3/uL (ref 150–400)
RBC: 2.98 MIL/uL — ABNORMAL LOW (ref 3.87–5.11)
RDW: 13.1 % (ref 11.5–15.5)
WBC: 12.5 10*3/uL — ABNORMAL HIGH (ref 4.0–10.5)
nRBC: 0 % (ref 0.0–0.2)

## 2020-08-30 LAB — HEMOGLOBIN A1C
Hgb A1c MFr Bld: 6.6 % — ABNORMAL HIGH (ref 4.8–5.6)
Mean Plasma Glucose: 143 mg/dL

## 2020-08-30 LAB — IRON AND TIBC
Iron: 32 ug/dL (ref 28–170)
Saturation Ratios: 18 % (ref 10.4–31.8)
TIBC: 175 ug/dL — ABNORMAL LOW (ref 250–450)
UIBC: 143 ug/dL

## 2020-08-30 LAB — FERRITIN: Ferritin: 940 ng/mL — ABNORMAL HIGH (ref 11–307)

## 2020-08-30 LAB — MAGNESIUM: Magnesium: 2.1 mg/dL (ref 1.7–2.4)

## 2020-08-30 LAB — PHOSPHORUS: Phosphorus: 4.5 mg/dL (ref 2.5–4.6)

## 2020-08-30 MED ORDER — HEPARIN SODIUM (PORCINE) 1000 UNIT/ML IJ SOLN
INTRAMUSCULAR | Status: AC
  Administered 2020-08-30: 1000 [IU] via INTRAVENOUS_CENTRAL
  Filled 2020-08-30: qty 1

## 2020-08-30 MED ORDER — DARBEPOETIN ALFA 60 MCG/0.3ML IJ SOSY
60.0000 ug | PREFILLED_SYRINGE | Freq: Once | INTRAMUSCULAR | Status: AC
  Administered 2020-08-30: 60 ug via INTRAVENOUS
  Filled 2020-08-30 (×2): qty 0.3

## 2020-08-30 MED ORDER — DARBEPOETIN ALFA 40 MCG/0.4ML IJ SOSY
PREFILLED_SYRINGE | INTRAMUSCULAR | Status: AC
  Filled 2020-08-30: qty 0.4

## 2020-08-30 MED ORDER — ONDANSETRON 4 MG PO TBDP: 4.0000 mg | ORAL_TABLET | Freq: Four times a day (QID) | ORAL | Status: DC | PRN

## 2020-08-30 MED ORDER — CINACALCET HCL 30 MG PO TABS
ORAL_TABLET | ORAL | Status: AC
  Filled 2020-08-30: qty 1

## 2020-08-30 MED ORDER — DOXERCALCIFEROL 4 MCG/2ML IV SOLN
INTRAVENOUS | Status: AC
  Administered 2020-08-30: 5 ug via INTRAVENOUS
  Filled 2020-08-30: qty 4

## 2020-08-30 NOTE — Progress Notes (Signed)
   NAME:  Courtney Gray, MRN:  [id number], DOB:  21-Oct-1970, LOS: 2 ADMISSION DATE:  08/28/2020, CONSULTATION DATE:  08/28/2020 REFERRING MD:  Dr. Sherry Ruffing, CHIEF COMPLAINT:  Acute respiratory distress    History of Present Illness:   50 year old woman with hx of ESRD on HD TTS, HTN, HLD, T2DM and morbid obesity, presenting with hypertensive emergency and respiratory failure.  Has been having access issues and had revision of  Now on BIPAP and nitroglycerin drip feeling slightly better but still very dyspneic.  Receievd urgent HD 5/24 and started on nitro gtt for BP control.  Pertinent  Medical History   ESRD on HD T, TH, Sat HTN HLD Type 2 diabetes Morbid obesity  Significant Hospital Events: Including procedures, antibiotic start and stop dates in addition to other pertinent events    5/24 presented for elective exchange of tunneled HD cath and later developed acute respiratory distress felt secondary to volume overload. Placed on BiPAP.   Interim History / Subjective:  Weaned off nitro gtt.  Bps are fluctuating.  Sugars low.  Objective   Blood pressure (!) 160/71, pulse (!) 57, temperature 97.8 F (36.6 C), temperature source Oral, resp. rate 10, weight (!) 174.2 kg, SpO2 100 %.        Intake/Output Summary (Last 24 hours) at 08/30/2020 0723 Last data filed at 08/30/2020 0700 Gross per 24 hour  Intake 409.15 ml  Output 3027 ml  Net -2617.85 ml   Filed Weights   08/29/20 1945 08/30/20 0459  Weight: (!) 172 kg (!) 174.2 kg    Examination: Constitutional: no acute distress  Eyes: EOMI, pupils equal Ears, nose, mouth, and throat: [place] 4, MMM Cardiovascular: slightly bradycardic, ext warm Respiratory: diminished at bases, no accessory muscle use Gastrointestinal: soft, +BS Ext: improving edema Skin: No rashes, normal turgor Neurologic: Moves all 4 ext to command, good strength Psychiatric: good insight    Labs/imaging that I havepersonally reviewed  (right  click and "Reselect all SmartList Selections" daily)  Chemistries improved with HD CBC stable Weight is up?  Resolved Hospital Problem list     Assessment & Plan:   Acute hypoxemic respiratory failure due to hypertensive urgency and volume overload.  Off BIPAP x 24h Dry weight 154kg, weight today is 174?  -S/p HD 5/25 and 5/26 -Renal diet -Continue home meds, will hold off on isordil for now given BP at eval is 117/62  DM2with hypoglycemia Class 3 obesity  - Hold oral antiglycemic agents - Liberalize diet  Best practice (right click and "Reselect all SmartList Selections" daily)  Diet:  Oral Pain/Anxiety/Delirium protocol (if indicated): No VAP protocol (if indicated): Not indicated DVT prophylaxis: LMWH GI prophylaxis: PPI Glucose control:  SSI Yes Central venous access:  N/A Arterial line:  N/A Foley:  N/A Mobility:  bed rest  PT consulted: N/A Last date of multidisciplinary goals of care discussion [n/a] Code Status:  full code Disposition: stable for floor, appreciate TRH taking over starting 08/31/20  Erskine Emery MD [facility]  Prefer epic messenger for cross cover needs If after hours, please call E-link

## 2020-08-30 NOTE — Progress Notes (Signed)
[facility] Progress Note  Name: Courtney Gray MRN: [id number] DOB: June 23, 1970   Subjective:  had HD on 5/25 pm with 3.0 kg UF.  She is off of nitro.  States she doesn't take over 4 kg off with HD.  She states is on 3-4 liters oxygen at home and is on 5 liters here.   Review of systems:   denies shortness of breath  Some nausea no vomiting; states gastroparesis.   Denies chest pain   ---------- Background on consult:  Courtney Gray is a 50 y.o. female with a PMH significant for DM, HTN, morbid obesity, and ESRD on HD TTS at [facility] who had her RIJ TDC fall out during HD on 08/25/20.  She was sent to IR for replacement of RIJ [facility] today but then developed acute respiratory distress after the procedure and was transferred to the emergency room.  In the ED she was noted to be hypoxic, tachycardic, and hypertensive.  CXR with cardiomegaly and worsening CHF/volume overload.  She was placed on BiPAP and oxygen with improvement of her acute distress.  We were consulted to provide urgent HD with UF to help with her respiratory status and HTN.   Intake/Output Summary (Last 24 hours) at 08/30/2020 0704 Last data filed at 08/30/2020 0400 Gross per 24 hour  Intake 392.68 ml  Output 3027 ml  Net -2634.32 ml    Vitals:  Vitals:   08/30/20 0200 08/30/20 0330 08/30/20 0342 08/30/20 0459  BP: 123/62 (!) 160/71    Pulse: (!) 49 (!) 57    Resp: 16 10    Temp:   97.8 F (36.6 C)   TempSrc:   Oral   SpO2: 99% 100%    Weight:    (!) 174.2 kg     Physical Exam:   General adult female in bed in no acute distress HEENT normocephalic atraumatic extraocular movements intact sclera anicteric Neck supple trachea midline Lungs clear to auscultation bilaterally unlabored at rest on 5 liters  Heart S1S2 no rub Abdomen soft nontender nondistended Extremities 2+ edema  Psych normal mood and affect Neuro alert and oriented x 3 provides hx and follows commands Access RIJ nontunneled  catheter in place; right AVF bruit no thrill   Medications reviewed   Labs:  BMP Latest Ref Rng & Units 08/30/2020 08/29/2020 08/28/2020  Glucose 70 - 99 mg/dL 78 90 78  BUN 6 - 20 mg/dL 25(H) 40(H) 34(H)  Creatinine 0.44 - 1.00 mg/dL 7.63(H) 9.48(H) 8.43(H)  Sodium 135 - 145 mmol/L 133(L) 133(L) 137  Potassium 3.5 - 5.1 mmol/L 3.6 3.4(L) 3.9  Chloride 98 - 111 mmol/L 95(L) 96(L) 99  CO2 22 - 32 mmol/L '27 25 23  '$ Calcium 8.9 - 10.3 mg/dL 8.2(L) 7.9(L) 8.8(L)    HD Center: [place]  on TTS   (these may be from old orders as patient states EDW 167.5 kg.   EDW 167.5 kg  1K/2.5 Ca Heparin 2600 units bolus then 600 unit hourly 4 hours  BF 400  DF 500 RIJ catheter Last post weight - 5/21 169.4 kg  Patient prefers to keep BP elevated per HD charge RN (her HD outpatient unit pt thinks 150 is low)  Hectoral 5 mcg IV/HD Epogen 7000 Units IV/HD    Other cinacalcet 30 mg every HD   Assessment/Plan:   1.  Acute hypoxic respiratory distress - presumably due to volume overload. S/p serial HD  2.  ESRD - HD per TTS schedule.  S/p additional  HD on 5/25 to optimize.  On a 1 K bath outpatient.  Continue 2K here   3. HTN - HTN urgency in ER after procedure. S/p UF with HD.  Team restarted home meds.  Optimize volume with HD.  She has evidence of overload on exam   4. Anemia CKD - aranesp 60 mcg on 5/26  5. Metabolic bone disease. Phos ok. sensipar with HD - continue here. Continue hectorol while here.   Disposition - per primary team    Claudia Desanctis, MD 08/30/2020 7:26 AM

## 2020-08-30 NOTE — Progress Notes (Signed)
Round on patient today secondary to concern during HD treatment. Spoke with patient at great length to gain insights. This nurse with plan to follow-up with evening nurse and leadership. This nurse also spoke with the patient about reasons for her admission. Of note the patient is trying to start a vegan diet and is finding it difficult with the renal diet. Patient also verbalizes difficulty following her fluid restrictions in the outpatient setting. This nurse re-educated on the importance of fluid restriction. Patient verbalizes an understanding. Will place consult to the registered dietician and provide handouts on hyperphosphatemia and hyperkalemia. Will continue to follow to resolve.  Dorthey Sawyer, RN  Dialysis Nurse Coordinator [phone]

## 2020-08-30 NOTE — Evaluation (Signed)
Physical Therapy Evaluation Patient Details Name: Courtney Gray MRN: [id number] DOB: [birth year] Today's Date: 08/30/2020   History of Present Illness  50 year old woman 5/24 presented for elective exchange of tunneled HD cath and later developed acute respiratory distress and HTN urgency felt secondary to volume overload. Placed on BiPAP.  PMH: ESRD on HD TTS, HTN, HLD, T2DM and morbid obesity  Clinical Impression  Pt admitted with above diagnosis. Pt was able to ambulate with supervision with rollator.  Pt educated on how to use the rollator today. Pt demonstrates good technique and is interested in getting one for home for incr stability. Will follow acutely.  Pt currently with functional limitations due to the deficits listed below (see PT Problem List). Pt will benefit from skilled PT to increase their independence and safety with mobility to allow discharge to the venue listed below.      Follow Up Recommendations No PT follow up    Equipment Recommendations  Other (comment) (Bariatric rollator)    Recommendations for Other Services       Precautions / Restrictions Precautions Precautions: Fall Restrictions Weight Bearing Restrictions: No      Mobility  Bed Mobility Overal bed mobility: Independent             General bed mobility comments: Took incr time but pt was able to come to EOB without assist.    Transfers Overall transfer level: Needs assistance Equipment used: 4-wheeled walker Transfers: Sit to/from Stand Sit to Stand: Min guard;Supervision         General transfer comment: Pt demonstrates safe technique with sit to stand.  Is able to ensure brakes are locked on rollator prior to use each stand attempt.  Ambulation/Gait Ambulation/Gait assistance: Supervision Gait Distance (Feet): 280 Feet Assistive device: 4-wheeled walker Gait Pattern/deviations: Step-through pattern;Decreased stride length;Drifts right/left   Gait velocity interpretation: <1.8  ft/sec, indicate of risk for recurrent falls General Gait Details: Pt was able to ambulate with rollator with good safety.  Pt used appropriate technique to take a sitting rest break locking brakes appropriately.  Stairs            Wheelchair Mobility    Modified Rankin (Stroke Patients Only)       Balance Overall balance assessment: Needs assistance Sitting-balance support: No upper extremity supported;Feet supported Sitting balance-[name] Scale: Fair     Standing balance support: Bilateral upper extremity supported;During functional activity;No upper extremity supported Standing balance-[name] Scale: Fair Standing balance comment: can stand statically without rollator/cane.  Pt relies on UE support of rollator with dynamic activity.                             Pertinent Vitals/Pain Pain Assessment: No/denies pain    Home Living Family/patient expects to be discharged to:: Private residence Living Arrangements: Children Available Help at Discharge: Family;Available 24 hours/day Type of Home: Apartment Home Access: Stairs to enter   Entrance Stairs-Number of Steps: 1 Home Layout: One level Home Equipment: Hand held shower head;Shower seat;Cane - single point;Grab bars - tub/shower      Prior Function Level of Independence: Independent with assistive device(s)         Comments: used cane PTA     Hand Dominance   Dominant Hand: Left    Extremity/Trunk Assessment   Upper Extremity Assessment Upper Extremity Assessment: Defer to OT evaluation    Lower Extremity Assessment Lower Extremity Assessment: Generalized weakness    Cervical /  Trunk Assessment Cervical / Trunk Assessment: Normal  Communication   Communication: No difficulties  Cognition Arousal/Alertness: Awake/alert Behavior During Therapy: WFL for tasks assessed/performed Overall Cognitive Status: Within Functional Limits for tasks assessed                                         General Comments General comments (skin integrity, edema, etc.): 57 bpm at rest; 100% 6LO2 at rest.  Pt desat to 87% on 6L with activity but did not sustain at 87% for longer than a few seconds.  Pt HR to 124 bpm but at times the waveform looked like artifact.  DOE at end of walk 3/4.    Exercises     Assessment/Plan    PT Assessment Patient needs continued PT services  PT Problem List Decreased activity tolerance;Decreased balance;Decreased knowledge of use of DME;Decreased safety awareness;Decreased knowledge of precautions;Cardiopulmonary status limiting activity;Decreased mobility       PT Treatment Interventions DME instruction;Gait training;Functional mobility training;Therapeutic activities;Therapeutic exercise;Balance training;Patient/family education    PT Goals (Current goals can be found in the Care Plan section)  Acute Rehab PT Goals Patient Stated Goal: to go home PT Goal Formulation: With patient Time For Goal Achievement: 09/13/20 Potential to Achieve Goals: Good    Frequency Min 3X/week   Barriers to discharge        Co-evaluation               AM-PAC PT "6 Clicks" Mobility  Outcome Measure Help needed turning from your back to your side while in a flat bed without using bedrails?: None Help needed moving from lying on your back to sitting on the side of a flat bed without using bedrails?: None Help needed moving to and from a bed to a chair (including a wheelchair)?: None Help needed standing up from a chair using your arms (e.g., wheelchair or bedside chair)?: None Help needed to walk in hospital room?: A Little Help needed climbing 3-5 steps with a railing? : A Little 6 Click Score: 22    End of Session Equipment Utilized During Treatment: Gait belt;Oxygen Activity Tolerance: Patient limited by fatigue Patient left: in chair;with call bell/phone within reach;with chair alarm set Nurse Communication: Mobility status PT Visit  Diagnosis: Muscle weakness (generalized) (M62.81)    Time: [id number] PT Time Calculation (min) (ACUTE ONLY): 42 min   Charges:   PT Evaluation $PT Eval Moderate Complexity: 1 Mod PT Treatments $Gait Training: 23-37 mins        Gerry Heaphy M,PT Acute Rehab Services [phone] (pager)  Alvira Philips 08/30/2020, 12:59 PM

## 2020-08-31 DIAGNOSIS — J9621 Acute and chronic respiratory failure with hypoxia: Secondary | ICD-10-CM

## 2020-08-31 LAB — CBC
HCT: 30.7 % — ABNORMAL LOW (ref 36.0–46.0)
Hemoglobin: 9.6 g/dL — ABNORMAL LOW (ref 12.0–15.0)
MCH: 31.4 pg (ref 26.0–34.0)
MCHC: 31.3 g/dL (ref 30.0–36.0)
MCV: 100.3 fL — ABNORMAL HIGH (ref 80.0–100.0)
Platelets: 161 10*3/uL (ref 150–400)
RBC: 3.06 MIL/uL — ABNORMAL LOW (ref 3.87–5.11)
RDW: 13.2 % (ref 11.5–15.5)
WBC: 10.9 10*3/uL — ABNORMAL HIGH (ref 4.0–10.5)
nRBC: 0 % (ref 0.0–0.2)

## 2020-08-31 LAB — MAGNESIUM: Magnesium: 2.1 mg/dL (ref 1.7–2.4)

## 2020-08-31 LAB — BASIC METABOLIC PANEL
Anion gap: 10 (ref 5–15)
BUN: 27 mg/dL — ABNORMAL HIGH (ref 6–20)
CO2: 28 mmol/L (ref 22–32)
Calcium: 8.3 mg/dL — ABNORMAL LOW (ref 8.9–10.3)
Chloride: 94 mmol/L — ABNORMAL LOW (ref 98–111)
Creatinine, Ser: 7.44 mg/dL — ABNORMAL HIGH (ref 0.44–1.00)
GFR, Estimated: 6 mL/min — ABNORMAL LOW (ref >60)
Glucose, Bld: 192 mg/dL — ABNORMAL HIGH (ref 70–99)
Potassium: 3.9 mmol/L (ref 3.5–5.1)
Sodium: 132 mmol/L — ABNORMAL LOW (ref 135–145)

## 2020-08-31 LAB — PHOSPHORUS: Phosphorus: 5.1 mg/dL — ABNORMAL HIGH (ref 2.5–4.6)

## 2020-08-31 LAB — GLUCOSE, CAPILLARY
Glucose-Capillary: 172 mg/dL — ABNORMAL HIGH (ref 70–99)
Glucose-Capillary: 212 mg/dL — ABNORMAL HIGH (ref 70–99)

## 2020-08-31 MED ORDER — DOXERCALCIFEROL 4 MCG/2ML IV SOLN: 5.0000 ug | INTRAVENOUS | Status: DC

## 2020-08-31 MED ORDER — CINACALCET HCL 30 MG PO TABS: 30.0000 mg | ORAL_TABLET | ORAL | 0 refills | Status: DC

## 2020-08-31 NOTE — TOC Initial Note (Signed)
Transition of Care [facility]) - Initial/Assessment Note    Patient Details  Name: Courtney Gray MRN: [id number] Date of Birth: March 06, 1971  [facility] CM/SW Contact:    Marilu Favre, RN Phone Number: 08/31/2020, 11:28 AM  Clinical Narrative:                 Patient from home. Wanting a purple rollator. Ordered rollator through [place] with [facility], they do not have a purple rollator. Patient aware and is OK with another color. [employer] will bring rollator to room.   PAtient has home oxygen with [employer]. She has a portable tank at bedside , however it is empty. Patient stated she was fine to go home with no oxygen. NCM advised that [place] is [place] long drive. NCM called [employer] to bring portable tank to room prior to discharge. Patient aware.   When patient ready for discharge, bedside nurse will call [facility].   Expected Discharge Plan: Home/Self Care Barriers to Discharge: No Barriers Identified   Patient Goals and CMS Choice Patient states their goals for this hospitalization and ongoing recovery are:: to go home CMS Medicare.gov Compare Post Acute Care list provided to:: Patient    Expected Discharge Plan and Services Expected Discharge Plan: Home/Self Care       Living arrangements for the past 2 months: Single Family Home Expected Discharge Date: 08/31/20               DME Arranged: Oxygen DME Agency: [place] Date DME Agency Contacted: 08/31/20 Time DME Agency Contacted: [phone] Representative spoke with at DME Agency: [place]  portable oxygen tank [place] Arranged: NA          Prior Living Arrangements/Services Living arrangements for the past 2 months: Single Family Home Lives with:: Self Patient language and need for interpreter reviewed:: Yes Do you feel safe going back to the place where you live?: Yes      Need for Family Participation in Patient Care: Yes (Comment) Care giver support system in place?: Yes (comment) Current home services:  DME Criminal Activity/Legal Involvement Pertinent to Current Situation/Hospitalization: No - Comment as needed  Activities of Daily Living Home Assistive Devices/Equipment: Oxygen ADL Screening (condition at time of admission) Patient's cognitive ability adequate to safely complete daily activities?: Yes Is the patient deaf or have difficulty hearing?: No Does the patient have difficulty seeing, even when wearing glasses/contacts?: No Does the patient have difficulty concentrating, remembering, or making decisions?: No Patient able to express need for assistance with ADLs?: Yes Does the patient have difficulty dressing or bathing?: No Independently performs ADLs?: Yes (appropriate for developmental age) Does the patient have difficulty walking or climbing stairs?: Yes Weakness of Legs: None Weakness of Arms/Hands: None  Permission Sought/Granted   Permission granted to share information with : No              Emotional Assessment Appearance:: Appears stated age Attitude/Demeanor/Rapport: Engaged Affect (typically observed): Accepting Orientation: : Oriented to Self,Oriented to Place,Oriented to  Time,Oriented to Situation Alcohol / Substance Use: Not Applicable Psych Involvement: No (comment)  Admission diagnosis:  Respiratory failure ([place]) [J96.90] Respiratory distress [R06.03] Hypertensive emergency [I16.1] Hypervolemia, unspecified hypervolemia type [E87.70] Patient Active Problem List   Diagnosis Date Noted  . Respiratory distress   . Hypertensive emergency   . Respiratory failure ([place]) 08/28/2020  . Acute respiratory failure with hypoxia ([place]) 11/28/2019  . Fluid overload 11/28/2019  . Hypertensive urgency 11/28/2019  . Type 2 diabetes mellitus with  other specified complication ([place]) [id number]  . Anemia of chronic disease 11/28/2019  . Morbid obesity ([place]) 11/28/2019   PCP:  Monico Blitz, MD Pharmacy:   [facility] [place], [place] [address] Phone: [phone] Fax: [phone]     Social Determinants of Health (SDOH) Interventions    Readmission Risk Interventions No flowsheet data found.

## 2020-08-31 NOTE — Discharge Summary (Signed)
Physician Discharge Summary   Courtney Gray  female DOB: [birth year]  [id number]  PCP: Monico Blitz, MD  Admit date: 08/28/2020 Discharge date: 08/31/2020  Admitted From: home Disposition:  home CODE STATUS: Full code  Discharge Instructions    Discharge instructions   Complete by: As directed    Your blood sugars dropped while on glipizide.  Please stop taking glipizide.   [facility] Course:  For full details, please see H&P, progress notes, consult notes and ancillary notes.  Briefly,  Courtney Gray is a 50 year old woman with hx of ESRD on HD TTS, HTN, HLD,T2DMand morbid obesity.  Pt presented for elective tunnel HD cath insertion. Patient developed acute respiratory distress post-procedure. Transferred from IR to the ED for further evaluation.   On ED transfer, patient was noted to have tachypnea and hypertensive crisis with BP 220/96. CXR demonstrated cardiomegaly with worsening CHF vs. fluid overload. Given tachypnea with respiratory distress and concern for volume overload in the setting of ESRD, patient was placed on BiPAP and nitro gtt, and Nephrology was consulted for emergency dialysis.   PCCM was consulted for admission and further management.  Pt was transferred out of ICU on 5/27.  Acute on chronic hypoxemic respiratory failure due to hypertensive urgency and volume overload Chronic hypoxic respiratory failure on home 3L O2 Pt received HD 5/25 and 5/26 and was off BiPAP and back on home 3L O2 prior to discharge.  Pt worked with PT and felt eager and ready for discharge.  BP improved after nitro gtt and dialysis.  Pt was discharged on home amlodipine, coreg, Lisinopril.  DM2 Class 3 obesity Pt did received home glipizide during her initial hospitalization, and had 2 episodes of hypoglycemia.  Given her ESRD status, glipizide was held and d/c'ed at discharge.  Pt continued on home Lantus.   Discharge Diagnoses:  Active Problems:    Respiratory failure (HCC)   Respiratory distress   Hypertensive emergency   30 Day Unplanned Readmission Risk Score   Flowsheet Row ED to Hosp-Admission (Current) from 08/28/2020 in [facility]  30 Day Unplanned Readmission Risk Score (%) 25.98 Filed at 08/31/2020 0801     This score is the patient's risk of an unplanned readmission within 30 days of being discharged (0 -100%). The score is based on dignosis, age, lab data, medications, orders, and past utilization.   Low:  0-14.9   Medium: 15-21.9   High: 22-29.9   Extreme: 30 and above        Discharge Instructions:  Allergies as of 08/31/2020      Reactions   Benzonatate Other (See Comments)   Hallucinations   Hydralazine Other (See Comments)   Hallucinations   Amoxicillin Nausea And Vomiting   Clonidine Other (See Comments)   Altered mental state   Morphine Itching   Oxycodone Itching      Medication List    STOP taking these medications   glipiZIDE 5 MG tablet Commonly known as: GLUCOTROL     TAKE these medications   acetaminophen 500 MG tablet Commonly known as: TYLENOL Take 1,000 mg by mouth every 6 (six) hours as needed for moderate pain or headache.   amLODipine 10 MG tablet Commonly known as: NORVASC Take 10 mg by mouth See admin instructions. Every other day (on dialysis days) if blood pressure is in the 200's.   aspirin EC 81 MG tablet Take 81 mg by mouth  daily.   atorvastatin 10 MG tablet Commonly known as: LIPITOR Take 10 mg by mouth daily.   carvedilol 25 MG tablet Commonly known as: COREG Take 25 mg by mouth 2 (two) times daily.   cinacalcet 30 MG tablet Commonly known as: SENSIPAR Take 1 tablet (30 mg total) by mouth Every Tuesday,Thursday,and Saturday with dialysis. Start taking on: Sep 01, 2020   diphenhydrAMINE 25 MG tablet Commonly known as: BENADRYL Take 50 mg by mouth daily as needed for allergies.   docusate sodium 100 MG capsule Commonly  known as: COLACE Take 100 mg by mouth daily.   doxercalciferol 4 MCG/2ML injection Commonly known as: HECTOROL Inject 2.5 mLs (5 mcg total) into the vein Every Tuesday,Thursday,and Saturday with dialysis. Start taking on: Sep 01, 2020   famotidine 20 MG tablet Commonly known as: PEPCID Take 20 mg by mouth daily as needed for heartburn or indigestion.   gabapentin 100 MG capsule Commonly known as: NEURONTIN Take 100-200 mg by mouth See admin instructions. Take 100 mg twice daily on non dialysis days.  Take 100 mg at 9am before dialysis, '100mg'$  after dialysis at 5pm, and '100mg'$  at 1am on Tuesdays, Thursdays,and Saturdays.   GAS-X PO Take 1 tablet by mouth daily as needed (gas).   Lantus SoloStar 100 UNIT/ML Solostar Pen Generic drug: insulin glargine Inject 12 Units into the skin at bedtime.   lidocaine-prilocaine cream Commonly known as: EMLA Apply 1 application topically as needed (port access).   lisinopril 5 MG tablet Commonly known as: ZESTRIL Take 5 mg by mouth daily.   nitroGLYCERIN 0.4 MG SL tablet Commonly known as: NITROSTAT Place 0.4 mg under the tongue every 5 (five) minutes as needed for chest pain.   sodium chloride 0.65 % Soln nasal spray Commonly known as: OCEAN Place 1 spray into both nostrils in the morning and at bedtime.   tiZANidine 4 MG tablet Commonly known as: ZANAFLEX Take 4 mg by mouth every 8 (eight) hours.            Durable Medical Equipment  (From admission, onward)         Start     Ordered   08/31/20 0844  For home use only DME 4 wheeled rolling walker with seat  Once       Comments: Bariatric rollator  Question:  Patient needs a walker to treat with the following condition  Answer:  Weakness   08/31/20 0844           Follow-up Information    Monico Blitz, MD. Schedule an appointment as soon as possible for a visit in 1 week(s).   Specialty: Internal Medicine Contact information: [address] [place]  [address] [phone]               Allergies  Allergen Reactions  . Benzonatate Other (See Comments)    Hallucinations  . Hydralazine Other (See Comments)    Hallucinations  . Amoxicillin Nausea And Vomiting  . Clonidine Other (See Comments)    Altered mental state  . Morphine Itching  . Oxycodone Itching     The results of significant diagnostics from this hospitalization (including imaging, microbiology, ancillary and laboratory) are listed below for reference.   Consultations:   Procedures/Studies: DG Chest Portable 1 View  Result Date: 08/28/2020 CLINICAL DATA:  50 year old female with respiratory failure. EXAM: PORTABLE CHEST 1 VIEW COMPARISON:  Chest radiograph dated 08/25/2020. FINDINGS: Right IJ dialysis catheter with tip over the right atrium. There is shallow inspiration.  There is cardiomegaly with vascular congestion and edema. Significant worsening of CHF or fluid overload compared to the prior radiograph. Small bilateral pleural effusions and bibasilar atelectasis. Pneumonia is not excluded. No pneumothorax. No acute osseous pathology. IMPRESSION: Cardiomegaly with significant worsening of CHF or fluid overload compared to the prior radiograph. Electronically Signed   By: Anner Crete M.D.   On: 08/28/2020 16:23   DG Chest Port 1 View  Result Date: 08/25/2020 CLINICAL DATA:  Weakness. EXAM: PORTABLE CHEST 1 VIEW COMPARISON:  November 28, 2019 FINDINGS: A right-sided venous catheter is seen with its distal tip overlying the midportion of the superior vena cava. This is approximately 5.0 cm proximal to the junction of the superior vena cava and right atrium. There is no evidence of acute infiltrate, pleural effusion or pneumothorax. Stable moderate to marked severity enlargement of the cardiac silhouette is seen. The visualized skeletal structures are unremarkable. IMPRESSION: Right-sided venous catheter positioning, as described above. Electronically Signed   By:  Virgina Norfolk M.D.   On: 08/25/2020 15:43      Labs: BNP (last 3 results) Recent Labs    11/28/19 1149  BNP 0000000*   Basic Metabolic Panel: Recent Labs  Lab 08/28/20 1538 08/28/20 1604 08/28/20 2310 08/29/20 1948 08/30/20 0224 08/31/20 0714  NA 138 137 137 133* 133* 132*  K 4.4 4.3 3.9 3.4* 3.6 3.9  CL 102 106 99 96* 95* 94*  CO2 22  --  '23 25 27 28  '$ GLUCOSE 134* 132* 78 90 78 192*  BUN 71* 69* 34* 40* 25* 27*  CREATININE 14.79* 15.80* 8.43* 9.48* 7.63* 7.44*  CALCIUM 8.4*  --  8.8* 7.9* 8.2* 8.3*  MG  --   --  2.3  --  2.1 2.1  PHOS  --   --  3.6 3.7 4.5 5.1*   Liver Function Tests: Recent Labs  Lab 08/28/20 1538 08/29/20 1948  AST 19  --   ALT 20  --   ALKPHOS 58  --   BILITOT 0.9  --   PROT 7.5  --   ALBUMIN 3.9 3.3*   No results for input(s): LIPASE, AMYLASE in the last 168 hours. No results for input(s): AMMONIA in the last 168 hours. CBC: Recent Labs  Lab 08/25/20 1551 08/28/20 1538 08/28/20 1604 08/28/20 2310 08/29/20 1948 08/30/20 0224 08/31/20 0714  WBC 11.3* 13.0*  --  16.2* 11.3* 12.5* 10.9*  NEUTROABS 9.0* 10.4*  --   --   --   --   --   HGB 10.7* 10.9* 10.9* 11.4* 8.8* 9.3* 9.6*  HCT 35.2* 36.0 32.0* 35.8* 28.6* 30.0* 30.7*  MCV 102.9* 103.2*  --  100.6* 101.8* 100.7* 100.3*  PLT 174 194  --  190 151 158 161   Cardiac Enzymes: No results for input(s): CKTOTAL, CKMB, CKMBINDEX, TROPONINI in the last 168 hours. BNP: Invalid input(s): POCBNP CBG: Recent Labs  Lab 08/30/20 0723 08/30/20 1111 08/30/20 1730 08/30/20 2131 08/31/20 0731  GLUCAP 81 142* 108* 174* 172*   D-Dimer No results for input(s): DDIMER in the last 72 hours. Hgb A1c Recent Labs    08/28/20 2310  HGBA1C 6.6*   Lipid Profile No results for input(s): CHOL, HDL, LDLCALC, TRIG, CHOLHDL, LDLDIRECT in the last 72 hours. Thyroid function studies No results for input(s): TSH, T4TOTAL, T3FREE, THYROIDAB in the last 72 hours.  Invalid input(s):  FREET3 Anemia work up Recent Labs    08/30/20 0224  FERRITIN 940*  TIBC 175*  IRON 32  Urinalysis No results found for: COLORURINE, APPEARANCEUR, LABSPEC, [facility], GLUCOSEU, [place], [place], [place], PROTEINUR, UROBILINOGEN, NITRITE, LEUKOCYTESUR Sepsis Labs Invalid input(s): PROCALCITONIN,  WBC,  LACTICIDVEN Microbiology Recent Results (from the past 240 hour(s))  Resp Panel by RT-PCR (Flu A&B, Covid) Nasopharyngeal Swab     Status: None   Collection Time: 08/28/20  3:54 PM   Specimen: Nasopharyngeal Swab; Nasopharyngeal(NP) swabs in vial transport medium  Result Value Ref Range Status   SARS Coronavirus 2 by RT PCR NEGATIVE NEGATIVE Final    Comment: (NOTE) SARS-CoV-2 target nucleic acids are NOT DETECTED.  The SARS-CoV-2 RNA is generally detectable in upper respiratory specimens during the acute phase of infection. The lowest concentration of SARS-CoV-2 viral copies this assay can detect is 138 copies/mL. A negative result does not preclude SARS-Cov-2 infection and should not be used as the sole basis for treatment or other patient management decisions. A negative result may occur with  improper specimen collection/handling, submission of specimen other than nasopharyngeal swab, presence of viral mutation(s) within the areas targeted by this assay, and inadequate number of viral copies(<138 copies/mL). A negative result must be combined with clinical observations, patient history, and epidemiological information. The expected result is Negative.  Fact Sheet for Patients:  EntrepreneurPulse.com.au  Fact Sheet for Healthcare Providers:  IncredibleEmployment.be  This test is no t yet approved or cleared by the [place] FDA and  has been authorized for detection and/or diagnosis of SARS-CoV-2 by FDA under an Emergency Use Authorization (EUA). This EUA will remain  in effect (meaning this test can be used) for the duration of  the COVID-19 declaration under Section 564(b)(1) of the Act, 21 U.S.C.section 360bbb-3(b)(1), unless the authorization is terminated  or revoked sooner.       Influenza A by PCR NEGATIVE NEGATIVE Final   Influenza B by PCR NEGATIVE NEGATIVE Final    Comment: (NOTE) The Xpert Xpress SARS-CoV-2/FLU/RSV plus assay is intended as an aid in the diagnosis of influenza from Nasopharyngeal swab specimens and should not be used as a sole basis for treatment. Nasal washings and aspirates are unacceptable for Xpert Xpress SARS-CoV-2/FLU/RSV testing.  Fact Sheet for Patients: EntrepreneurPulse.com.au  Fact Sheet for Healthcare Providers: IncredibleEmployment.be  This test is not yet approved or cleared by the [place] FDA and has been authorized for detection and/or diagnosis of SARS-CoV-2 by FDA under an Emergency Use Authorization (EUA). This EUA will remain in effect (meaning this test can be used) for the duration of the COVID-19 declaration under Section 564(b)(1) of the Act, 21 U.S.C. section 360bbb-3(b)(1), unless the authorization is terminated or revoked.  Performed at [facility], [place] [address], [place], [place] [address]   MRSA PCR Screening     Status: None   Collection Time: 08/28/20 11:48 PM   Specimen: Nasopharyngeal  Result Value Ref Range Status   MRSA by PCR NEGATIVE NEGATIVE Final    Comment:        The GeneXpert MRSA Assay (FDA approved for NASAL specimens only), is one component of a comprehensive MRSA colonization surveillance program. It is not intended to diagnose MRSA infection nor to guide or monitor treatment for MRSA infections. Performed at [facility], [place] [address] [place], [place] [address]      Total time spend on discharging this patient, including the last patient exam, discussing the hospital stay, instructions for ongoing care as it relates to all pertinent caregivers, as well as  preparing the medical discharge records, prescriptions, and/or referrals  as applicable, is 40 minutes.    Enzo Bi, MD  [facility] 08/31/2020, 10:04 AM

## 2020-08-31 NOTE — Progress Notes (Signed)
[facility] Progress Note  Name: Courtney Gray MRN: [id number] DOB: 1971/01/05   Subjective:  had HD on 5/26 pm with 2 kg UF.  States she doesn't take over 4 kg off with HD.  She states is on 3-4 liters oxygen at home and is on 3 liters oxygen here. She's excited to be going home today.   Review of systems:     denies shortness of breath  Denies n/v Denies chest pain   ---------- Background on consult:  Courtney Gray is a 50 y.o. female with a PMH significant for DM, HTN, morbid obesity, and ESRD on HD TTS at [facility] who had her RIJ TDC fall out during HD on 08/25/20.  She was sent to IR for replacement of RIJ [facility] today but then developed acute respiratory distress after the procedure and was transferred to the emergency room.  In the ED she was noted to be hypoxic, tachycardic, and hypertensive.  CXR with cardiomegaly and worsening CHF/volume overload.  She was placed on BiPAP and oxygen with improvement of her acute distress.  We were consulted to provide urgent HD with UF to help with her respiratory status and HTN.   Intake/Output Summary (Last 24 hours) at 08/31/2020 1152 Last data filed at 08/31/2020 0903 Gross per 24 hour  Intake 350 ml  Output 2000 ml  Net -1650 ml    Vitals:  Vitals:   08/31/20 0000 08/31/20 0621 08/31/20 0625 08/31/20 1112  BP: (!) 157/75  (!) 153/63 (!) 161/57  Pulse: (!) 58  (!) 59   Resp: 20  17   Temp: 98.2 F (36.8 C)  98.9 F (37.2 C)   TempSrc: Oral  Oral   SpO2: 100%  100% 100%  Weight:  (!) 170 kg       Physical Exam:  General adult female in bed in no acute distress HEENT normocephalic atraumatic extraocular movements intact sclera anicteric Neck supple trachea midline Lungs clear to auscultation bilaterally unlabored at rest on 5 liters  Heart S1S2 no rub Abdomen soft nontender nondistended Extremities 2+ edema  Psych normal mood and affect Neuro alert and oriented x 3 provides hx and follows commands Access  RIJ tunneled catheter in place  Medications reviewed   Labs:  BMP Latest Ref Rng & Units 08/31/2020 08/30/2020 08/29/2020  Glucose 70 - 99 mg/dL 192(H) 78 90  BUN 6 - 20 mg/dL 27(H) 25(H) 40(H)  Creatinine 0.44 - 1.00 mg/dL 7.44(H) 7.63(H) 9.48(H)  Sodium 135 - 145 mmol/L 132(L) 133(L) 133(L)  Potassium 3.5 - 5.1 mmol/L 3.9 3.6 3.4(L)  Chloride 98 - 111 mmol/L 94(L) 95(L) 96(L)  CO2 22 - 32 mmol/L '28 27 25  '$ Calcium 8.9 - 10.3 mg/dL 8.3(L) 8.2(L) 7.9(L)    HD Center: [facility]  on TTS   (these may be from old orders as patient states EDW 167.5 kg.   EDW 167.5 kg  1K/2.5 Ca Heparin 2600 units bolus then 600 unit hourly 4 hours  BF 400  DF 500 RIJ catheter Last post weight - 5/21 169.4 kg  Patient prefers to keep BP elevated per HD charge RN (her HD outpatient unit pt thinks 150 is low)  Hectoral 5 mcg IV/HD Epogen 7000 Units IV/HD    Other cinacalcet 30 mg every HD   Assessment/Plan:   1.  Acute hypoxic respiratory distress - presumably due to volume overload. S/p serial HD  2.  ESRD - HD per TTS schedule.  S/p additional HD on 5/25 to  optimize.    3. HTN - HTN urgency in ER after procedure. S/p UF with HD.  Team restarted home meds.  Optimize volume with HD - nephrology will continue efforts as outpatient   4. Anemia CKD - aranesp 60 mcg on 5/26  5. Metabolic bone disease. Phos ok. sensipar with HD - continue here. Continue hectorol while here.   Disposition - per primary team.  She is discharging home today.    Claudia Desanctis, MD 08/31/2020 11:59 AM

## 2020-08-31 NOTE — Progress Notes (Signed)
Discharge instructions given and patient stated understanding.

## 2020-08-31 NOTE — Progress Notes (Signed)
Renal Navigator notified patient's outpatient HD clinic/[name] of patient's plan for discharge today and faxed Nephrology note for continuity of care.   Alphonzo Cruise, [place] Renal Navigator [phone]

## 2020-08-31 NOTE — Progress Notes (Signed)
Nutrition Education Note  RD consulted for Renal Education-specifically focusing on fluid restriction but also discussing pt's desire to transition to a vegan diet.   Reviewed pt's current diet at home; pt currently eats seafood, chicken and eggs but no red meat. Pt reports she is hoping to eventually transition to a vegan/plant based diet but reports she is going to take it slowly. Discussed plant-based source of protein including tofu, soy, dried beans/legumes, lentils. Explained that pt my substitute this items for poultry/seafood at meal times. Provided pt with sample vegetarian/vegan recipes incorporating some of these ingredients. Reviewed importance of limiting high potassium fruit and veggies in her diet, food lists provided. Pt with hx of hyperkalemia, runs on 1 K+ bath as outpatient. Potassium currently wdl.   Also provided patient with handouts from AND and [facility] with regards to transitioning to a vegan or vegetarian/plant based diet  Pt is very knowledgeable with regards to renal diet. With regards to fluid restriction, pt reports she is aware of many of the techniques to manage thirst including using hard candy/sour candy/gum, frozen grapes/berries, etc. Discussed use of dry mouth mouthwashes, pt may suck on ice but must include this in her total fluid volume for the day. Provided pt with handout on managing fluid in dialysis patients .  Explained why diet restrictions are needed and provided lists of foods to limit/avoid that are high potassium, sodium, and phosphorus. Provided specific recommendations on safer alternatives of these foods. Strongly encouraged compliance of this diet.   Encouraged pt to discuss specific diet questions/concerns with RD at HD outpatient facility. Pt reports she talks to the dietitian at the outpatient facility but does not feel he has been very helpful. Teach back method used.  Expect good compliance.  Body mass index is 62.37 kg/m. Pt meets criteria for  morbid obesity based on current BMI.  Pt is being discharged to home today.     Kerman Passey MS, RDN, LDN, CNSC Registered Dietitian III Clinical Nutrition RD Pager and On-Call Pager Number Located in [place]

## 2020-09-01 ENCOUNTER — Encounter (HOSPITAL_COMMUNITY): Payer: Self-pay

## 2020-09-01 ENCOUNTER — Inpatient Hospital Stay (HOSPITAL_COMMUNITY)
Admission: EM | Admit: 2020-09-01 | Discharge: 2020-09-07 | DRG: 314 | Disposition: A | Discharge status: Home / Self Care | Payer: Medicare Other | Attending: Internal Medicine | Admitting: Internal Medicine

## 2020-09-01 ENCOUNTER — Observation Stay (HOSPITAL_COMMUNITY): Payer: Medicare Other

## 2020-09-01 ENCOUNTER — Emergency Department (HOSPITAL_COMMUNITY): Payer: Medicare Other

## 2020-09-01 DIAGNOSIS — Z452 Encounter for adjustment and management of vascular access device: Secondary | ICD-10-CM

## 2020-09-01 DIAGNOSIS — N2581 Secondary hyperparathyroidism of renal origin: Secondary | ICD-10-CM | POA: Diagnosis not present

## 2020-09-01 DIAGNOSIS — Z9981 Dependence on supplemental oxygen: Secondary | ICD-10-CM

## 2020-09-01 DIAGNOSIS — Z992 Dependence on renal dialysis: Secondary | ICD-10-CM

## 2020-09-01 DIAGNOSIS — G4733 Obstructive sleep apnea (adult) (pediatric): Secondary | ICD-10-CM | POA: Diagnosis present

## 2020-09-01 DIAGNOSIS — Z833 Family history of diabetes mellitus: Secondary | ICD-10-CM

## 2020-09-01 DIAGNOSIS — E785 Hyperlipidemia, unspecified: Secondary | ICD-10-CM | POA: Diagnosis present

## 2020-09-01 DIAGNOSIS — N189 Chronic kidney disease, unspecified: Secondary | ICD-10-CM | POA: Diagnosis present

## 2020-09-01 DIAGNOSIS — E1169 Type 2 diabetes mellitus with other specified complication: Secondary | ICD-10-CM | POA: Diagnosis not present

## 2020-09-01 DIAGNOSIS — B965 Pseudomonas (aeruginosa) (mallei) (pseudomallei) as the cause of diseases classified elsewhere: Secondary | ICD-10-CM | POA: Diagnosis not present

## 2020-09-01 DIAGNOSIS — Z885 Allergy status to narcotic agent status: Secondary | ICD-10-CM | POA: Diagnosis not present

## 2020-09-01 DIAGNOSIS — I161 Hypertensive emergency: Secondary | ICD-10-CM | POA: Diagnosis not present

## 2020-09-01 DIAGNOSIS — I10 Essential (primary) hypertension: Secondary | ICD-10-CM | POA: Diagnosis present

## 2020-09-01 DIAGNOSIS — Z880 Allergy status to penicillin: Secondary | ICD-10-CM

## 2020-09-01 DIAGNOSIS — Z794 Long term (current) use of insulin: Secondary | ICD-10-CM | POA: Diagnosis present

## 2020-09-01 DIAGNOSIS — T80211A Bloodstream infection due to central venous catheter, initial encounter: Secondary | ICD-10-CM | POA: Diagnosis not present

## 2020-09-01 DIAGNOSIS — E8770 Fluid overload, unspecified: Secondary | ICD-10-CM | POA: Diagnosis present

## 2020-09-01 DIAGNOSIS — E7800 Pure hypercholesterolemia, unspecified: Secondary | ICD-10-CM | POA: Diagnosis not present

## 2020-09-01 DIAGNOSIS — N186 End stage renal disease: Secondary | ICD-10-CM | POA: Diagnosis not present

## 2020-09-01 DIAGNOSIS — D638 Anemia in other chronic diseases classified elsewhere: Secondary | ICD-10-CM | POA: Diagnosis not present

## 2020-09-01 DIAGNOSIS — Z20822 Contact with and (suspected) exposure to covid-19: Secondary | ICD-10-CM | POA: Diagnosis present

## 2020-09-01 DIAGNOSIS — D631 Anemia in chronic kidney disease: Secondary | ICD-10-CM | POA: Diagnosis not present

## 2020-09-01 DIAGNOSIS — R110 Nausea: Secondary | ICD-10-CM | POA: Diagnosis present

## 2020-09-01 DIAGNOSIS — E1122 Type 2 diabetes mellitus with diabetic chronic kidney disease: Secondary | ICD-10-CM | POA: Diagnosis present

## 2020-09-01 DIAGNOSIS — J9611 Chronic respiratory failure with hypoxia: Secondary | ICD-10-CM | POA: Diagnosis present

## 2020-09-01 DIAGNOSIS — R7881 Bacteremia: Secondary | ICD-10-CM | POA: Diagnosis present

## 2020-09-01 DIAGNOSIS — R509 Fever, unspecified: Secondary | ICD-10-CM | POA: Diagnosis present

## 2020-09-01 DIAGNOSIS — E871 Hypo-osmolality and hyponatremia: Secondary | ICD-10-CM | POA: Diagnosis not present

## 2020-09-01 DIAGNOSIS — I120 Hypertensive chronic kidney disease with stage 5 chronic kidney disease or end stage renal disease: Secondary | ICD-10-CM | POA: Diagnosis present

## 2020-09-01 DIAGNOSIS — Z79899 Other long term (current) drug therapy: Secondary | ICD-10-CM

## 2020-09-01 DIAGNOSIS — R531 Weakness: Secondary | ICD-10-CM | POA: Diagnosis not present

## 2020-09-01 DIAGNOSIS — B9689 Other specified bacterial agents as the cause of diseases classified elsewhere: Secondary | ICD-10-CM | POA: Diagnosis present

## 2020-09-01 DIAGNOSIS — Z888 Allergy status to other drugs, medicaments and biological substances status: Secondary | ICD-10-CM

## 2020-09-01 DIAGNOSIS — Z6841 Body Mass Index (BMI) 40.0 and over, adult: Secondary | ICD-10-CM

## 2020-09-01 DIAGNOSIS — Z7982 Long term (current) use of aspirin: Secondary | ICD-10-CM

## 2020-09-01 LAB — CBC WITH DIFFERENTIAL/PLATELET
Abs Immature Granulocytes: 0.05 10*3/uL (ref 0.00–0.07)
Basophils Absolute: 0 10*3/uL (ref 0.0–0.1)
Basophils Relative: 0 %
Eosinophils Absolute: 0.1 10*3/uL (ref 0.0–0.5)
Eosinophils Relative: 1 %
HCT: 29.8 % — ABNORMAL LOW (ref 36.0–46.0)
Hemoglobin: 9.2 g/dL — ABNORMAL LOW (ref 12.0–15.0)
Immature Granulocytes: 0 %
Lymphocytes Relative: 6 %
Lymphs Abs: 0.7 10*3/uL (ref 0.7–4.0)
MCH: 31.9 pg (ref 26.0–34.0)
MCHC: 30.9 g/dL (ref 30.0–36.0)
MCV: 103.5 fL — ABNORMAL HIGH (ref 80.0–100.0)
Monocytes Absolute: 1.1 10*3/uL — ABNORMAL HIGH (ref 0.1–1.0)
Monocytes Relative: 9 %
Neutro Abs: 10 10*3/uL — ABNORMAL HIGH (ref 1.7–7.7)
Neutrophils Relative %: 84 %
Platelets: 158 10*3/uL (ref 150–400)
RBC: 2.88 MIL/uL — ABNORMAL LOW (ref 3.87–5.11)
RDW: 13.1 % (ref 11.5–15.5)
WBC: 12 10*3/uL — ABNORMAL HIGH (ref 4.0–10.5)
nRBC: 0 % (ref 0.0–0.2)

## 2020-09-01 LAB — COMPREHENSIVE METABOLIC PANEL
ALT: 17 U/L (ref 0–44)
AST: 14 U/L — ABNORMAL LOW (ref 15–41)
Albumin: 3.5 g/dL (ref 3.5–5.0)
Alkaline Phosphatase: 49 U/L (ref 38–126)
Anion gap: 11 (ref 5–15)
BUN: 51 mg/dL — ABNORMAL HIGH (ref 6–20)
CO2: 24 mmol/L (ref 22–32)
Calcium: 8 mg/dL — ABNORMAL LOW (ref 8.9–10.3)
Chloride: 96 mmol/L — ABNORMAL LOW (ref 98–111)
Creatinine, Ser: 11.23 mg/dL — ABNORMAL HIGH (ref 0.44–1.00)
GFR, Estimated: 4 mL/min — ABNORMAL LOW (ref >60)
Glucose, Bld: 179 mg/dL — ABNORMAL HIGH (ref 70–99)
Potassium: 3.9 mmol/L (ref 3.5–5.1)
Sodium: 131 mmol/L — ABNORMAL LOW (ref 135–145)
Total Bilirubin: 0.7 mg/dL (ref 0.3–1.2)
Total Protein: 7.4 g/dL (ref 6.5–8.1)

## 2020-09-01 LAB — RESP PANEL BY RT-PCR (FLU A&B, COVID) ARPGX2
Influenza A by PCR: NEGATIVE
Influenza B by PCR: NEGATIVE
SARS Coronavirus 2 by RT PCR: NEGATIVE

## 2020-09-01 LAB — GLUCOSE, CAPILLARY: Glucose-Capillary: 114 mg/dL — ABNORMAL HIGH (ref 70–99)

## 2020-09-01 LAB — LIPASE, BLOOD: Lipase: 35 U/L (ref 11–51)

## 2020-09-01 MED ORDER — SODIUM CHLORIDE 0.9% FLUSH
3.0000 mL | Freq: Two times a day (BID) | INTRAVENOUS | Status: DC
  Administered 2020-09-02 – 2020-09-07 (×8): 3 mL via INTRAVENOUS

## 2020-09-01 MED ORDER — CARVEDILOL 12.5 MG PO TABS
25.0000 mg | ORAL_TABLET | Freq: Two times a day (BID) | ORAL | Status: DC
  Administered 2020-09-01: 25 mg via ORAL
  Filled 2020-09-01: qty 2

## 2020-09-01 MED ORDER — NITROGLYCERIN 0.4 MG SL SUBL: 0.4000 mg | SUBLINGUAL_TABLET | SUBLINGUAL | Status: DC | PRN

## 2020-09-01 MED ORDER — ATORVASTATIN CALCIUM 10 MG PO TABS
10.0000 mg | ORAL_TABLET | Freq: Every day | ORAL | Status: DC
  Administered 2020-09-01 – 2020-09-06 (×5): 10 mg via ORAL
  Filled 2020-09-01 (×6): qty 1

## 2020-09-01 MED ORDER — ACETAMINOPHEN 650 MG RE SUPP: 650.0000 mg | Freq: Four times a day (QID) | RECTAL | Status: DC | PRN

## 2020-09-01 MED ORDER — SODIUM CHLORIDE 0.9 % IV SOLN
2.0000 g | Freq: Once | INTRAVENOUS | Status: AC
  Administered 2020-09-01: 2 g via INTRAVENOUS
  Filled 2020-09-01: qty 2

## 2020-09-01 MED ORDER — IOHEXOL 9 MG/ML PO SOLN
ORAL | Status: AC
  Filled 2020-09-01: qty 1000

## 2020-09-01 MED ORDER — TIZANIDINE HCL 4 MG PO TABS
4.0000 mg | ORAL_TABLET | Freq: Three times a day (TID) | ORAL | Status: DC
  Administered 2020-09-01 – 2020-09-07 (×17): 4 mg via ORAL
  Filled 2020-09-01 (×17): qty 1

## 2020-09-01 MED ORDER — GABAPENTIN 100 MG PO CAPS: 100.0000 mg | ORAL_CAPSULE | ORAL | Status: DC

## 2020-09-01 MED ORDER — SENNOSIDES-DOCUSATE SODIUM 8.6-50 MG PO TABS
2.0000 | ORAL_TABLET | Freq: Every day | ORAL | Status: DC
  Administered 2020-09-02 – 2020-09-06 (×5): 2 via ORAL
  Filled 2020-09-01 (×5): qty 2

## 2020-09-01 MED ORDER — INSULIN ASPART 100 UNIT/ML IJ SOLN: 0.0000 [IU] | Freq: Every day | INTRAMUSCULAR | Status: DC

## 2020-09-01 MED ORDER — INSULIN ASPART 100 UNIT/ML IJ SOLN
0.0000 [IU] | Freq: Three times a day (TID) | INTRAMUSCULAR | Status: DC
  Administered 2020-09-02: 2 [IU] via SUBCUTANEOUS
  Administered 2020-09-04 – 2020-09-07 (×7): 1 [IU] via SUBCUTANEOUS

## 2020-09-01 MED ORDER — VANCOMYCIN HCL 2000 MG/400ML IV SOLN
2000.0000 mg | Freq: Once | INTRAVENOUS | Status: AC
  Administered 2020-09-01: 2000 mg via INTRAVENOUS
  Filled 2020-09-01: qty 400

## 2020-09-01 MED ORDER — ONDANSETRON HCL 4 MG PO TABS: 4.0000 mg | ORAL_TABLET | Freq: Four times a day (QID) | ORAL | Status: DC | PRN

## 2020-09-01 MED ORDER — SODIUM CHLORIDE 0.9% FLUSH
3.0000 mL | INTRAVENOUS | Status: DC | PRN
  Administered 2020-09-04: 3 mL via INTRAVENOUS

## 2020-09-01 MED ORDER — HEPARIN SODIUM (PORCINE) 5000 UNIT/ML IJ SOLN
5000.0000 [IU] | Freq: Three times a day (TID) | INTRAMUSCULAR | Status: DC
  Administered 2020-09-02: 5000 [IU] via SUBCUTANEOUS
  Filled 2020-09-01 (×6): qty 1

## 2020-09-01 MED ORDER — AMLODIPINE BESYLATE 5 MG PO TABS: 10.0000 mg | ORAL_TABLET | ORAL | Status: DC

## 2020-09-01 MED ORDER — FAMOTIDINE 20 MG PO TABS
20.0000 mg | ORAL_TABLET | Freq: Every day | ORAL | Status: DC
  Administered 2020-09-04 – 2020-09-07 (×4): 20 mg via ORAL
  Filled 2020-09-01 (×5): qty 1

## 2020-09-01 MED ORDER — ACETAMINOPHEN 325 MG PO TABS
650.0000 mg | ORAL_TABLET | Freq: Four times a day (QID) | ORAL | Status: DC | PRN
  Administered 2020-09-02 – 2020-09-05 (×4): 650 mg via ORAL
  Administered 2020-09-05: 325 mg via ORAL
  Administered 2020-09-06: 650 mg via ORAL
  Filled 2020-09-01 (×6): qty 2

## 2020-09-01 MED ORDER — CHLORHEXIDINE GLUCONATE CLOTH 2 % EX PADS
6.0000 | MEDICATED_PAD | Freq: Every day | CUTANEOUS | Status: DC
  Administered 2020-09-02 – 2020-09-03 (×2): 6 via TOPICAL

## 2020-09-01 MED ORDER — DILTIAZEM HCL 25 MG/5ML IV SOLN
15.0000 mg | Freq: Once | INTRAVENOUS | Status: AC
  Administered 2020-09-01: 15 mg via INTRAVENOUS
  Filled 2020-09-01: qty 5

## 2020-09-01 MED ORDER — ASPIRIN EC 81 MG PO TBEC
81.0000 mg | DELAYED_RELEASE_TABLET | Freq: Every day | ORAL | Status: DC
  Administered 2020-09-02 – 2020-09-07 (×6): 81 mg via ORAL
  Filled 2020-09-01 (×6): qty 1

## 2020-09-01 MED ORDER — CINACALCET HCL 30 MG PO TABS
30.0000 mg | ORAL_TABLET | ORAL | Status: DC
  Filled 2020-09-01: qty 1

## 2020-09-01 MED ORDER — TRAZODONE HCL 50 MG PO TABS: 50.0000 mg | ORAL_TABLET | Freq: Every evening | ORAL | Status: DC | PRN

## 2020-09-01 MED ORDER — SODIUM CHLORIDE 0.9% FLUSH
3.0000 mL | Freq: Two times a day (BID) | INTRAVENOUS | Status: DC
  Administered 2020-09-06: 3 mL via INTRAVENOUS

## 2020-09-01 MED ORDER — SODIUM CHLORIDE 0.9 % IV SOLN: 250.0000 mL | INTRAVENOUS | Status: DC | PRN

## 2020-09-01 MED ORDER — HEPARIN SODIUM (PORCINE) 1000 UNIT/ML IJ SOLN
INTRAMUSCULAR | Status: AC
  Administered 2020-09-02: 3800 [IU] via INTRAVENOUS_CENTRAL
  Filled 2020-09-01: qty 4

## 2020-09-01 MED ORDER — DOXERCALCIFEROL 4 MCG/2ML IV SOLN
INTRAVENOUS | Status: AC
  Administered 2020-09-01: 5 ug via INTRAVENOUS
  Filled 2020-09-01: qty 4

## 2020-09-01 MED ORDER — SODIUM CHLORIDE 0.9 % IV SOLN
1.0000 g | INTRAVENOUS | Status: DC
  Filled 2020-09-01: qty 1

## 2020-09-01 MED ORDER — HEPARIN SODIUM (PORCINE) 1000 UNIT/ML DIALYSIS: 1000.0000 [IU] | INTRAMUSCULAR | Status: DC | PRN

## 2020-09-01 MED ORDER — ONDANSETRON HCL 4 MG/2ML IJ SOLN: 4.0000 mg | Freq: Four times a day (QID) | INTRAMUSCULAR | Status: DC | PRN

## 2020-09-01 MED ORDER — SODIUM CHLORIDE 0.9 % IV SOLN: 100.0000 mL | INTRAVENOUS | Status: DC | PRN

## 2020-09-01 MED ORDER — INSULIN GLARGINE 100 UNIT/ML ~~LOC~~ SOLN
12.0000 [IU] | Freq: Every day | SUBCUTANEOUS | Status: DC
  Administered 2020-09-02 – 2020-09-06 (×6): 12 [IU] via SUBCUTANEOUS
  Filled 2020-09-01 (×9): qty 0.12

## 2020-09-01 MED ORDER — LABETALOL HCL 5 MG/ML IV SOLN
10.0000 mg | INTRAVENOUS | Status: DC | PRN
  Administered 2020-09-01 – 2020-09-06 (×4): 10 mg via INTRAVENOUS
  Filled 2020-09-01 (×5): qty 4

## 2020-09-01 MED ORDER — ALTEPLASE 2 MG IJ SOLR: 2.0000 mg | Freq: Once | INTRAMUSCULAR | Status: DC | PRN

## 2020-09-01 MED ORDER — LIDOCAINE-PRILOCAINE 2.5-2.5 % EX CREA: 1.0000 "application " | TOPICAL_CREAM | CUTANEOUS | Status: DC | PRN

## 2020-09-01 MED ORDER — VANCOMYCIN HCL IN DEXTROSE 1-5 GM/200ML-% IV SOLN: 1000.0000 mg | INTRAVENOUS | Status: DC | PRN

## 2020-09-01 MED ORDER — POLYETHYLENE GLYCOL 3350 17 G PO PACK: 17.0000 g | PACK | Freq: Every day | ORAL | Status: DC | PRN

## 2020-09-01 MED ORDER — LISINOPRIL 5 MG PO TABS: 5.0000 mg | ORAL_TABLET | Freq: Every day | ORAL | Status: DC

## 2020-09-01 MED ORDER — GABAPENTIN 100 MG PO CAPS
100.0000 mg | ORAL_CAPSULE | ORAL | Status: DC
  Administered 2020-09-02 – 2020-09-06 (×7): 100 mg via ORAL
  Filled 2020-09-01 (×8): qty 1

## 2020-09-01 MED ORDER — HEPARIN SODIUM (PORCINE) 1000 UNIT/ML DIALYSIS: 3800.0000 [IU] | INTRAMUSCULAR | Status: DC | PRN

## 2020-09-01 MED ORDER — GABAPENTIN 100 MG PO CAPS
100.0000 mg | ORAL_CAPSULE | ORAL | Status: DC
  Administered 2020-09-02 – 2020-09-07 (×7): 100 mg via ORAL
  Filled 2020-09-01 (×7): qty 1

## 2020-09-01 MED ORDER — DIPHENHYDRAMINE HCL 25 MG PO CAPS
50.0000 mg | ORAL_CAPSULE | Freq: Every day | ORAL | Status: DC | PRN
  Administered 2020-09-04: 50 mg via ORAL
  Filled 2020-09-01: qty 2

## 2020-09-01 MED ORDER — DOXERCALCIFEROL 4 MCG/2ML IV SOLN
5.0000 ug | INTRAVENOUS | Status: DC
  Administered 2020-09-06: 5 ug via INTRAVENOUS
  Filled 2020-09-01 (×2): qty 4

## 2020-09-01 MED ORDER — BISACODYL 10 MG RE SUPP: 10.0000 mg | Freq: Every day | RECTAL | Status: DC | PRN

## 2020-09-01 NOTE — ED Notes (Signed)
Pt stated she could not drink any more contrast. States she needs to know dry weight wants a scale. Got roll scale  to pt room klg was 172.8.

## 2020-09-01 NOTE — ED Notes (Signed)
Limb restriction applied to right arm

## 2020-09-01 NOTE — Procedures (Signed)
   HEMODIALYSIS TREATMENT NOTE:  3.5 hour heparin-free HD completed via RIJ TDC.   Hypertensive throughout session with SBP climbing despite ultrafiltration.  Labetalol 39m IVP given once (per standing order) halfway through treatment for peak BP 229/87.  SBP remained >200 despite this.  During the last hour of treatment, Cardizem 143mIVP was given along with scheduled Coreg 2558mo - per Dr. ZieClearence PedSBPs < 170 thereafter.  Goal met: 4.3 liters removed.  No interruption in UF.  Vancomycin 2g load given during last 1.5 hours of treatment.      AngRockwell AlexandriaN

## 2020-09-01 NOTE — H&P (Signed)
Patient Demographics:    Courtney Gray, is a 50 y.o. female  MRN: [id number]   DOB - Aug 12, 1970  Admit Date - 09/01/2020  Outpatient Primary MD for the patient is Medicine, [facility]   Assessment & Plan:    Principal Problem:   Fever Active Problems:   HTN (hypertension)   ESRD (end stage renal disease) (HCC)   Chronic respiratory failure with hypoxia (HCC)   Type 2 diabetes mellitus with other specified complication (HCC)   Anemia of chronic disease   Morbid obesity ([place])    1)Febrile illness of unknown etiology----now with abdominal discomfort and nausea, CT abdomen and pelvis pending, WBC 12 -Patient had fevers above 102 on 08/31/2020 and again on 09/01/2020 -Patient had a new right IJ HD catheter placed on 08/28/2020 after previous HD catheter fell out on 08/25/2020 -Patient is on hemodialysis on the autonomic ER -Okay to cover empirically with IV vancomycin and cefepime pending blood culture data and CT abdomen and pelvis report -persistent fevers in the hemodialysis patient with new right IJ HD catheter-needs IV antibiotics pending bacteremia and sepsis rule out   2)ESRD--has been on hemodialysis for about 4 years -Last HD session on 08/30/2020 -Discussed with Dr. Harrie Jeans from nephrology service -Currently does not appear to be volume overloaded -Right upper extremity AV fistula is nonfunctional hemodialysis is being done through right IJ HD catheter which was placed on 08/28/2020 (prior HD catheter fell out on 08/25/20)  3) chronic anemia of ESRD--- Hgb currently at baseline  -Procrit/Aranesp per nephrology team  4)DM2-current A1c 6.6 reflecting excellent diabetic control PTA -Continue Lantus insulin 12 units qhs Use Novolog/Humalog Sliding scale insulin with Accu-Cheks/Fingersticks as  ordered -Glipizide was recently discontinued  5)HTN-okay to continue lisinopril, Coreg and amlodipine  may use IV labetalol when necessary  Every 4 hours for systolic blood pressure over 160 mmhg  6)HLD-  C/n Lipitor and aspirin  7) chronic hypoxic respiratory failure--- appears to be at baseline, continue oxygen supplementation via nasal cannula  8)Morbid Obesity-suspect OSA -Low calorie diet, portion control and increase physical activity discussed with patient -Body mass index is 62.37 kg/m.  -- CPAP/BiPAP as advised qhs    Disposition/Need for in-Hospital Stay- patient unable to be discharged at this time due to --persistent fevers in the hemodialysis patient with new right IJ HD catheter-needs IV antibiotics pending bacteremia and sepsis rule out  Dispo: The patient is from: Home              Anticipated d/c is to: Home              Anticipated d/c date is: 1 day              Patient currently is not medically stable to d/c. Barriers: Not Clinically Stable-    With History of - Reviewed by me  Past Medical History:  Diagnosis Date  . Diabetes mellitus without complication ([place])   . High  cholesterol   . Hypertension   . Renal disorder       Past Surgical History:  Procedure Laterality Date  . AV FISTULA PLACEMENT     x2, unsuccessful.   . IR FLUORO GUIDE CV LINE RIGHT  08/28/2020    Chief Complaint  Patient presents with  . Weakness  . Fever      HPI:    Courtney Gray  is a 50 y.o. female with past medical history relevant for morbid obesity, HTN, chronic hypoxic respiratory failure on 2 to 3 L of oxygen via nasal cannula, ESRD on HD Tuesdays Thursdays and Saturdays with last HD session on 08/30/2020, diabetes and HTN who presents to the ED with recurrent fevers -Patient was apparently discharged from [facility] on 08/31/2020 after being admitted on 08/28/2020 for volume overload and hypertensive urgency with BiPAP and IV nitro and diarrhea back-to-back  hemodialysis sessions -- As per patient on 08/31/2020 when she got home she had fever above 102 and chills took some Tylenol and felt better -Patient went to hemodialysis session on 09/01/2020 and was found to have a fever above 102 so she was sent for hemodialysis center to the ED for further evaluation -Patient endorses right upper quadrant tenderness with nausea but no emesis last BM 08/31/2020, no diarrhea, no chest pains no palpitations no shortness of breath -Patient denies productive cough -In the ED chest x-ray without acute findings and COVID negative -In ED WBC is 12.0 hemoglobin is 9.2 -Lipase is 35, sodium is 131 chloride is 96 glucose 170 creatinine 11.2 -CT abdomen pelvis ordered and pending -Blood cultures pending -Patient does not make urine --Patient had a new right IJ HD catheter placed on 08/28/2020 after previous HD catheter fell out on 08/25/2020    Review of systems:    In addition to the HPI above,   A full Review of  Systems was done, all other systems reviewed are negative except as noted above in HPI , .    Social History:  Reviewed by me    Social History   Tobacco Use  . Smoking status: Never Smoker  . Smokeless tobacco: Never Used  Substance Use Topics  . Alcohol use: Never     Family History :  Reviewed by me    Family History  Problem Relation Age of Onset  . Diabetes Mother   . Diabetes Father      Home Medications:   Prior to Admission medications   Medication Sig Start Date End Date Taking? Authorizing Provider  acetaminophen (TYLENOL) 500 MG tablet Take 1,000 mg by mouth every 6 (six) hours as needed for moderate pain or headache.   Yes [provider]  amLODipine (NORVASC) 10 MG tablet Take 10 mg by mouth See admin instructions. Every other day (on dialysis days) if blood pressure is in the 200's. 03/27/18  Yes [provider]  aspirin EC 81 MG tablet Take 81 mg by mouth daily.   Yes [provider]   atorvastatin (LIPITOR) 10 MG tablet Take 10 mg by mouth daily. 03/29/19  Yes [provider]  carvedilol (COREG) 25 MG tablet Take 25 mg by mouth 2 (two) times daily. 04/20/19  Yes [provider]  diphenhydrAMINE (BENADRYL) 25 MG tablet Take 50 mg by mouth daily as needed for allergies.   Yes [provider]  docusate sodium (COLACE) 100 MG capsule Take 100 mg by mouth daily.   Yes [provider]  doxercalciferol (HECTOROL) 4 MCG/2ML  injection Inject 2.5 mLs (5 mcg total) into the vein Every Tuesday,Thursday,and Saturday with dialysis. 09/01/20  Yes Enzo Bi, MD  gabapentin (NEURONTIN) 100 MG capsule Take 100-200 mg by mouth See admin instructions. Take 100 mg twice daily on non dialysis days.  Take 100 mg at 9am before dialysis, '100mg'$  after dialysis at 5pm, and '100mg'$  at 1am on Tuesdays, Thursdays,and Saturdays. 04/20/19  Yes [provider]  LANTUS SOLOSTAR 100 UNIT/ML Solostar Pen Inject 12 Units into the skin at bedtime.  03/18/19  Yes [provider]  lisinopril (ZESTRIL) 5 MG tablet Take 5 mg by mouth daily. 11/11/19  Yes [provider]  nitroGLYCERIN (NITROSTAT) 0.4 MG SL tablet Place 0.4 mg under the tongue every 5 (five) minutes as needed for chest pain.   Yes [provider]  Simethicone (GAS-X PO) Take 1 tablet by mouth daily as needed (gas).   Yes [provider]  sodium chloride (OCEAN) 0.65 % SOLN nasal spray Place 1 spray into both nostrils in the morning and at bedtime.   Yes [provider]  tiZANidine (ZANAFLEX) 4 MG tablet Take 4 mg by mouth every 8 (eight) hours.  04/20/19  Yes [provider]  cinacalcet (SENSIPAR) 30 MG tablet Take 1 tablet (30 mg total) by mouth Every Tuesday,Thursday,and Saturday with dialysis. 09/01/20 11/30/20  Enzo Bi, MD  famotidine (PEPCID) 20 MG tablet Take 20 mg by mouth daily as needed for heartburn or indigestion. Patient not taking: Reported on 09/01/2020     [provider]  lidocaine-prilocaine (EMLA) cream Apply 1 application topically as needed (port access). Patient not taking: Reported on 09/01/2020    [provider]     Allergies:     Allergies  Allergen Reactions  . Benzonatate Other (See Comments)    Hallucinations  . Hydralazine Other (See Comments)    Hallucinations  . Amoxicillin Nausea And Vomiting  . Clonidine Other (See Comments)    Altered mental state  . Morphine Itching  . Oxycodone Itching     Physical Exam:   Vitals  Blood pressure (!) 179/58, pulse 65, temperature 98.7 F (37.1 C), temperature source Rectal, resp. rate 18, height '5\' 5"'$  (1.651 m), weight (!) 170 kg, SpO2 100 %.  Physical Examination: General appearance - alert, morbidly obese appearing, and in no distress  Mental status - alert, oriented to person, place, and time,  Eyes - sclera anicteric Nose- [place] 3L/min Neck - supple, no JVD elevation , Chest - clear  to auscultation bilaterally, symmetrical air movement,  Heart - S1 and S2 normal, regular  Abdomen - soft, right upper quadrant tenderness without rebound or guarding, no CVA area tenderness, nondistended, increased truncal adiposity Neurological - screening mental status exam normal, neck supple without rigidity, cranial nerves II through XII intact, DTR's normal and symmetric Extremities - +ve pitting pedal edema noted, intact peripheral pulses  Skin - warm, dry MSK-right arm AV fistula is nonfunctional -Right IJ HD catheter site clean dry intact     Data Review:    CBC Recent Labs  Lab 08/28/20 1538 08/28/20 1604 08/28/20 2310 08/29/20 1948 08/30/20 0224 08/31/20 0714 09/01/20 1500  WBC 13.0*  --  16.2* 11.3* 12.5* 10.9* 12.0*  HGB 10.9*   < > 11.4* 8.8* 9.3* 9.6* 9.2*  HCT 36.0   < > 35.8* 28.6* 30.0* 30.7* 29.8*  PLT 194  --  190 151 158 161 158  MCV 103.2*  --  100.6* 101.8* 100.7* 100.3* 103.5[facility]  31.2  --  32.0 31.3 31.2 31.4 31.9  MCHC 30.3  --   31.8 30.8 31.0 31.3 30.9  RDW 13.3  --  13.2 12.9 13.1 13.2 13.1  LYMPHSABS 1.2  --   --   --   --   --  0.7  MONOABS 0.8  --   --   --   --   --  1.1*  EOSABS 0.6*  --   --   --   --   --  0.1  BASOSABS 0.0  --   --   --   --   --  0.0   < > = values in this interval not displayed.    Chemistries  Recent Labs  Lab 08/28/20 1538 08/28/20 1604 08/28/20 2310 08/29/20 1948 08/30/20 0224 08/31/20 0714 09/01/20 1500  NA 138   < > 137 133* 133* 132* 131*  K 4.4   < > 3.9 3.4* 3.6 3.9 3.9  CL 102   < > 99 96* 95* 94* 96*  CO2 22  --  '23 25 27 28 24  '$ GLUCOSE 134*   < > 78 90 78 192* 179*  BUN 71*   < > 34* 40* 25* 27* 51*  CREATININE 14.79*   < > 8.43* 9.48* 7.63* 7.44* 11.23*  CALCIUM 8.4*  --  8.8* 7.9* 8.2* 8.3* 8.0*  MG  --   --  2.3  --  2.1 2.1  --   AST 19  --   --   --   --   --  14*  ALT 20  --   --   --   --   --  17  ALKPHOS 58  --   --   --   --   --  49  BILITOT 0.9  --   --   --   --   --  0.7   < > = values in this interval not displayed.   ------------------------------------------------------------------------------------------------------------------ estimated creatinine clearance is 9.7 mL/min (A) (by C-G formula based on SCr of 11.23 mg/dL (H)). ------------------------------------------------------------------------------------------------------------------ No results for input(s): TSH, T4TOTAL, T3FREE, THYROIDAB in the last 72 hours.  Invalid input(s): FREET3   Coagulation profile No results for input(s): INR, PROTIME in the last 168 hours. ------------------------------------------------------------------------------------------------------------------- No results for input(s): DDIMER in the last 72 hours. -------------------------------------------------------------------------------------------------------------------  Cardiac Enzymes No results for input(s): CKMB, TROPONINI, MYOGLOBIN in the last 168 hours.  Invalid input(s):  CK ------------------------------------------------------------------------------------------------------------------    Component Value Date/Time   BNP 629.7 (H) 11/28/2019 1149    Urinalysis No results found for: COLORURINE, APPEARANCEUR, LABSPEC, PHURINE, GLUCOSEU, HGBUR, BILIRUBINUR, KETONESUR, PROTEINUR, UROBILINOGEN, NITRITE, LEUKOCYTESUR    Imaging Results:    DG Chest Portable 1 View  Result Date: 09/01/2020 CLINICAL DATA:  Elevated temperature during dialysis today. Weakness and fever. EXAM: PORTABLE CHEST 1 VIEW COMPARISON:  08/28/2020 and older exams. FINDINGS: Cardiac silhouette normal in size.  No mediastinal or hilar masses. Lungs clear. Opacity noted at the lung bases on the recent prior study is no longer visualized. Lung bases are somewhat suboptimally visualized due to the significant overlying soft tissue. Cannot exclude a small effusion. No pneumothorax. Right anterior chest wall internal jugular tunneled central venous catheter is stable. IMPRESSION: 1. No acute cardiopulmonary disease. Electronically Signed   By: Lajean Manes M.D.   On: 09/01/2020 14:55    Radiological Exams on Admission: DG Chest Portable 1 View  Result Date: 09/01/2020 CLINICAL DATA:  Elevated temperature  during dialysis today. Weakness and fever. EXAM: PORTABLE CHEST 1 VIEW COMPARISON:  08/28/2020 and older exams. FINDINGS: Cardiac silhouette normal in size.  No mediastinal or hilar masses. Lungs clear. Opacity noted at the lung bases on the recent prior study is no longer visualized. Lung bases are somewhat suboptimally visualized due to the significant overlying soft tissue. Cannot exclude a small effusion. No pneumothorax. Right anterior chest wall internal jugular tunneled central venous catheter is stable. IMPRESSION: 1. No acute cardiopulmonary disease. Electronically Signed   By: Lajean Manes M.D.   On: 09/01/2020 14:55    DVT Prophylaxis -SCD /heparin AM Labs Ordered, also please review  Full Orders  Family Communication: Admission, patients condition and plan of care including tests being ordered have been discussed with the patient  who indicate understanding and agree with the plan   Code Status - Full Code  Likely DC to  home  Condition   -stable  Roxan Hockey M.D on 09/01/2020 at 6:09 PM Go to [email or web address] -  for contact info  [facility] - Office  [phone]

## 2020-09-01 NOTE — ED Triage Notes (Signed)
103 temp at dialysis today, unable to get dialysis, pt complaining of weakness and fever, needs a negative COVID test to get dialysis, EMS gave pt 1g tylenol PO.

## 2020-09-01 NOTE — ED Provider Notes (Signed)
Emergency Department Provider Note   I have reviewed the triage vital signs and the nursing notes.   HISTORY  Chief Complaint Weakness and Fever   HPI Courtney Gray is a 50 y.o. female with past medical history of end-stage renal disease, diabetes, high cholesterol, hypertension, presents to the emergency department with fever from dialysis.  She was discharged from the hospital yesterday after hypoxic respiratory failure and hypertensive emergency.  She required BiPAP with her shortness of breath.  She had complications at her dialysis access and had an HD cath placed in the right chest.  She states has been feeling okay since discharge with some mild weakness.  No cough or specific shortness of breath.  She has some mild abdominal pain on the right abdomen which she associates with injecting herself with insulin in this area.  Denies any rashes.  She does not make urine.  When she showed up to dialysis her temp was 102 F and was brought to the emergency department by EMS.   Past Medical History:  Diagnosis Date  . Diabetes mellitus without complication ([place])   . High cholesterol   . Hypertension   . Renal disorder     Patient Active Problem List   Diagnosis Date Noted  . Respiratory distress   . Hypertensive emergency   . Respiratory failure ([place]) 08/28/2020  . Acute respiratory failure with hypoxia ([place]) 11/28/2019  . Fluid overload 11/28/2019  . Hypertensive urgency 11/28/2019  . Type 2 diabetes mellitus with other specified complication ([place]) [id number]  . Anemia of chronic disease 11/28/2019  . Morbid obesity ([place]) 11/28/2019    Past Surgical History:  Procedure Laterality Date  . AV FISTULA PLACEMENT     x2, unsuccessful.   . IR FLUORO GUIDE CV LINE RIGHT  08/28/2020    Allergies Benzonatate, Hydralazine, Amoxicillin, Clonidine, Morphine, and Oxycodone  Family History  Problem Relation Age of Onset  . Diabetes Mother   . Diabetes Father     Social  History Social History   Tobacco Use  . Smoking status: Never Smoker  . Smokeless tobacco: Never Used  Vaping Use  . Vaping Use: Never used  Substance Use Topics  . Alcohol use: Never  . Drug use: Never    Review of Systems  Constitutional: Positive fever/chills Eyes: No visual changes. ENT: No sore throat. Cardiovascular: Denies chest pain. Respiratory: Denies shortness of breath. Gastrointestinal: No abdominal pain.  No nausea, no vomiting.  No diarrhea.  No constipation. Genitourinary: Negative for dysuria. Musculoskeletal: Negative for back pain. Skin: Negative for rash. Neurological: Negative for headaches, focal weakness or numbness.  10-point ROS otherwise negative.  ____________________________________________   PHYSICAL EXAM:  VITAL SIGNS: ED Triage Vitals  Enc Vitals Group     BP 09/01/20 1400 (!) 185/76     Pulse Rate 09/01/20 1340 69     Resp 09/01/20 1400 (!) 30     Temp --      Temp src --      SpO2 09/01/20 1340 100 %     Weight 09/01/20 1341 (!) 374 lb 12.5 oz (170 kg)     Height 09/01/20 1341 '5\' 5"'$  (1.651 m)   Constitutional: Alert and oriented. Well appearing and in no acute distress. Eyes: Conjunctivae are normal.  Head: Atraumatic. Nose: No congestion/rhinnorhea. Mouth/Throat: Mucous membranes are moist.  Neck: No stridor.  Cardiovascular: Normal rate, regular rhythm. Good peripheral circulation. Grossly normal heart sounds.  Respiratory: Normal respiratory effort.  No retractions. Lungs CTAB.  Gastrointestinal: Soft and nontender. No distention.  Musculoskeletal: No lower extremity tenderness nor edema. No gross deformities of extremities. Neurologic:  Normal speech and language. No gross focal neurologic deficits are appreciated.  Skin:  Skin is warm, dry and intact. No rash noted. Well appearing HD cath in the right chest without surrounding erythema.    ____________________________________________   LABS (all labs ordered are  listed, but only abnormal results are displayed)  Labs Reviewed  COMPREHENSIVE METABOLIC PANEL - Abnormal; Notable for the following components:      Result Value   Sodium 131 (*)    Chloride 96 (*)    Glucose, Bld 179 (*)    BUN 51 (*)    Creatinine, Ser 11.23 (*)    Calcium 8.0 (*)    AST 14 (*)    GFR, Estimated 4 (*)    All other components within normal limits  CBC WITH DIFFERENTIAL/PLATELET - Abnormal; Notable for the following components:   WBC 12.0 (*)    RBC 2.88 (*)    Hemoglobin 9.2 (*)    HCT 29.8 (*)    MCV 103.5 (*)    Neutro Abs 10.0 (*)    Monocytes Absolute 1.1 (*)    All other components within normal limits  RESP PANEL BY RT-PCR (FLU A&B, COVID) [id number]  CULTURE, BLOOD (ROUTINE X 2)  CULTURE, BLOOD (ROUTINE X 2)  LIPASE, BLOOD   ____________________________________________  RADIOLOGY  DG Chest Portable 1 View  Result Date: 09/01/2020 CLINICAL DATA:  Elevated temperature during dialysis today. Weakness and fever. EXAM: PORTABLE CHEST 1 VIEW COMPARISON:  08/28/2020 and older exams. FINDINGS: Cardiac silhouette normal in size.  No mediastinal or hilar masses. Lungs clear. Opacity noted at the lung bases on the recent prior study is no longer visualized. Lung bases are somewhat suboptimally visualized due to the significant overlying soft tissue. Cannot exclude a small effusion. No pneumothorax. Right anterior chest wall internal jugular tunneled central venous catheter is stable. IMPRESSION: 1. No acute cardiopulmonary disease. Electronically Signed   By: Lajean Manes M.D.   On: 09/01/2020 14:55    ____________________________________________   PROCEDURES  Procedure(s) performed:   Procedures  None  ____________________________________________   INITIAL IMPRESSION / ASSESSMENT AND PLAN / ED COURSE  Pertinent labs & imaging results that were available during my care of the patient were reviewed by me and considered in my medical decision making (see  chart for details).   Patient presents to the emergency department with fever and some fatigue after recent hospitalization.  She missed dialysis today as she showed up with fever.  No clear source for infection.  COVID and flu are negative.  I have sent blood cultures.  The patient's HD cath is overall well-appearing with no obvious signs of infection.  Chest x-ray without infiltrate.  Patient does have leukocytosis.  She is afebrile here but we will continue to monitor her inpatient and arrange for dialysis in the next 24 hours.  Not see an emergent indication for dialysis on review of lab work.   Discussed patient's case with [facility] to request admission. Patient and family (if present) updated with plan. Care transferred to [facility] service.  I reviewed all nursing notes, vitals, pertinent old records, EKGs, labs, imaging (as available).    ____________________________________________  FINAL CLINICAL IMPRESSION(S) / ED DIAGNOSES  Final diagnoses:  Fever, unspecified fever cause    Note:  This document was prepared using Dragon voice recognition software and may include unintentional dictation errors.  Nanda Quinton, MD, [facility] Emergency Medicine    Trevell Pariseau, [facility], MD 09/01/20 [id number]

## 2020-09-01 NOTE — Progress Notes (Signed)
Pharmacy Antibiotic Note  Courtney Gray is a 50 y.o. female admitted on 09/01/2020 with sepsis.  Pharmacy has been consulted for Vancomycin and cefepime dosing.  Plan: Vancomycin 2gm IV loading dose, then 1gm IV every Hemodialysis session Cefepime 2gm IV loading dose, then 1gm IV q24h F/U cxs and clinical progress Monitor V/S, labs and levels as indicated  Height: '5\' 5"'$  (165.1 cm) Weight: (!) 170 kg (374 lb 12.5 oz) IBW/kg (Calculated) : 57  Temp (24hrs), Avg:98.3 F (36.8 C), Min:97.8 F (36.6 C), Max:98.7 F (37.1 C)  Recent Labs  Lab 08/28/20 2310 08/29/20 1948 08/30/20 0224 08/31/20 0714 09/01/20 1500  WBC 16.2* 11.3* 12.5* 10.9* 12.0*  CREATININE 8.43* 9.48* 7.63* 7.44* 11.23*    Estimated Creatinine Clearance: 9.7 mL/min (A) (by C-G formula based on SCr of 11.23 mg/dL (H)).    Allergies  Allergen Reactions  . Benzonatate Other (See Comments)    Hallucinations  . Hydralazine Other (See Comments)    Hallucinations  . Amoxicillin Nausea And Vomiting  . Clonidine Other (See Comments)    Altered mental state  . Morphine Itching  . Oxycodone Itching    Antimicrobials this admission: Vancomycin 5/28 >>  Cefepime 5/28 >>   Dose adjustments this admission: Adjust based on HD sessions  Microbiology results: 5/28 BCx: pending 5/28 UCx: pending  MRSA PCR:   Thank you for allowing pharmacy to be a part of this patient's care.  Isac Sarna, BS [place], [place] Clinical Pharmacist Pager [phone] 09/01/2020 5:05 PM

## 2020-09-02 ENCOUNTER — Observation Stay (HOSPITAL_COMMUNITY): Payer: Medicare Other

## 2020-09-02 ENCOUNTER — Other Ambulatory Visit: Payer: Self-pay

## 2020-09-02 DIAGNOSIS — E1169 Type 2 diabetes mellitus with other specified complication: Secondary | ICD-10-CM | POA: Diagnosis not present

## 2020-09-02 DIAGNOSIS — I10 Essential (primary) hypertension: Secondary | ICD-10-CM | POA: Diagnosis not present

## 2020-09-02 DIAGNOSIS — E871 Hypo-osmolality and hyponatremia: Secondary | ICD-10-CM | POA: Diagnosis not present

## 2020-09-02 DIAGNOSIS — Z880 Allergy status to penicillin: Secondary | ICD-10-CM | POA: Diagnosis not present

## 2020-09-02 DIAGNOSIS — M47816 Spondylosis without myelopathy or radiculopathy, lumbar region: Secondary | ICD-10-CM | POA: Diagnosis not present

## 2020-09-02 DIAGNOSIS — Z452 Encounter for adjustment and management of vascular access device: Secondary | ICD-10-CM | POA: Diagnosis not present

## 2020-09-02 DIAGNOSIS — E1122 Type 2 diabetes mellitus with diabetic chronic kidney disease: Secondary | ICD-10-CM | POA: Diagnosis present

## 2020-09-02 DIAGNOSIS — N2581 Secondary hyperparathyroidism of renal origin: Secondary | ICD-10-CM | POA: Diagnosis not present

## 2020-09-02 DIAGNOSIS — Z992 Dependence on renal dialysis: Secondary | ICD-10-CM | POA: Diagnosis not present

## 2020-09-02 DIAGNOSIS — I120 Hypertensive chronic kidney disease with stage 5 chronic kidney disease or end stage renal disease: Secondary | ICD-10-CM | POA: Diagnosis present

## 2020-09-02 DIAGNOSIS — Z9981 Dependence on supplemental oxygen: Secondary | ICD-10-CM | POA: Diagnosis not present

## 2020-09-02 DIAGNOSIS — I509 Heart failure, unspecified: Secondary | ICD-10-CM | POA: Diagnosis not present

## 2020-09-02 DIAGNOSIS — N261 Atrophy of kidney (terminal): Secondary | ICD-10-CM | POA: Diagnosis not present

## 2020-09-02 DIAGNOSIS — B965 Pseudomonas (aeruginosa) (mallei) (pseudomallei) as the cause of diseases classified elsewhere: Secondary | ICD-10-CM | POA: Diagnosis not present

## 2020-09-02 DIAGNOSIS — I132 Hypertensive heart and chronic kidney disease with heart failure and with stage 5 chronic kidney disease, or end stage renal disease: Secondary | ICD-10-CM | POA: Diagnosis not present

## 2020-09-02 DIAGNOSIS — I161 Hypertensive emergency: Secondary | ICD-10-CM | POA: Diagnosis present

## 2020-09-02 DIAGNOSIS — E785 Hyperlipidemia, unspecified: Secondary | ICD-10-CM | POA: Diagnosis present

## 2020-09-02 DIAGNOSIS — R509 Fever, unspecified: Secondary | ICD-10-CM | POA: Diagnosis not present

## 2020-09-02 DIAGNOSIS — T80212A Local infection due to central venous catheter, initial encounter: Secondary | ICD-10-CM | POA: Diagnosis not present

## 2020-09-02 DIAGNOSIS — I700 Atherosclerosis of aorta: Secondary | ICD-10-CM | POA: Diagnosis not present

## 2020-09-02 DIAGNOSIS — Z888 Allergy status to other drugs, medicaments and biological substances status: Secondary | ICD-10-CM | POA: Diagnosis not present

## 2020-09-02 DIAGNOSIS — E8779 Other fluid overload: Secondary | ICD-10-CM | POA: Diagnosis not present

## 2020-09-02 DIAGNOSIS — N186 End stage renal disease: Secondary | ICD-10-CM | POA: Diagnosis not present

## 2020-09-02 DIAGNOSIS — D631 Anemia in chronic kidney disease: Secondary | ICD-10-CM | POA: Diagnosis not present

## 2020-09-02 DIAGNOSIS — E7800 Pure hypercholesterolemia, unspecified: Secondary | ICD-10-CM | POA: Diagnosis present

## 2020-09-02 DIAGNOSIS — D638 Anemia in other chronic diseases classified elsewhere: Secondary | ICD-10-CM | POA: Diagnosis not present

## 2020-09-02 DIAGNOSIS — T80211A Bloodstream infection due to central venous catheter, initial encounter: Secondary | ICD-10-CM | POA: Diagnosis present

## 2020-09-02 DIAGNOSIS — J9611 Chronic respiratory failure with hypoxia: Secondary | ICD-10-CM | POA: Diagnosis not present

## 2020-09-02 DIAGNOSIS — Z833 Family history of diabetes mellitus: Secondary | ICD-10-CM | POA: Diagnosis not present

## 2020-09-02 DIAGNOSIS — Z885 Allergy status to narcotic agent status: Secondary | ICD-10-CM | POA: Diagnosis not present

## 2020-09-02 DIAGNOSIS — Z4901 Encounter for fitting and adjustment of extracorporeal dialysis catheter: Secondary | ICD-10-CM | POA: Diagnosis not present

## 2020-09-02 DIAGNOSIS — Z20822 Contact with and (suspected) exposure to covid-19: Secondary | ICD-10-CM | POA: Diagnosis present

## 2020-09-02 DIAGNOSIS — Z6841 Body Mass Index (BMI) 40.0 and over, adult: Secondary | ICD-10-CM | POA: Diagnosis not present

## 2020-09-02 DIAGNOSIS — R7881 Bacteremia: Secondary | ICD-10-CM | POA: Diagnosis not present

## 2020-09-02 DIAGNOSIS — Z794 Long term (current) use of insulin: Secondary | ICD-10-CM | POA: Diagnosis not present

## 2020-09-02 LAB — CBC
HCT: 30.2 % — ABNORMAL LOW (ref 36.0–46.0)
Hemoglobin: 9.2 g/dL — ABNORMAL LOW (ref 12.0–15.0)
MCH: 31.4 pg (ref 26.0–34.0)
MCHC: 30.5 g/dL (ref 30.0–36.0)
MCV: 103.1 fL — ABNORMAL HIGH (ref 80.0–100.0)
Platelets: 153 10*3/uL (ref 150–400)
RBC: 2.93 MIL/uL — ABNORMAL LOW (ref 3.87–5.11)
RDW: 13.1 % (ref 11.5–15.5)
WBC: 14.9 10*3/uL — ABNORMAL HIGH (ref 4.0–10.5)
nRBC: 0 % (ref 0.0–0.2)

## 2020-09-02 LAB — BLOOD CULTURE ID PANEL (REFLEXED) - BCID2

## 2020-09-02 LAB — COMPREHENSIVE METABOLIC PANEL
ALT: 23 U/L (ref 0–44)
AST: 25 U/L (ref 15–41)
Albumin: 3.6 g/dL (ref 3.5–5.0)
Alkaline Phosphatase: 52 U/L (ref 38–126)
Anion gap: 16 — ABNORMAL HIGH (ref 5–15)
BUN: 30 mg/dL — ABNORMAL HIGH (ref 6–20)
CO2: 23 mmol/L (ref 22–32)
Calcium: 8.1 mg/dL — ABNORMAL LOW (ref 8.9–10.3)
Chloride: 93 mmol/L — ABNORMAL LOW (ref 98–111)
Creatinine, Ser: 8.01 mg/dL — ABNORMAL HIGH (ref 0.44–1.00)
GFR, Estimated: 6 mL/min — ABNORMAL LOW (ref >60)
Glucose, Bld: 144 mg/dL — ABNORMAL HIGH (ref 70–99)
Potassium: 3.3 mmol/L — ABNORMAL LOW (ref 3.5–5.1)
Sodium: 132 mmol/L — ABNORMAL LOW (ref 135–145)
Total Bilirubin: 0.9 mg/dL (ref 0.3–1.2)
Total Protein: 7.5 g/dL (ref 6.5–8.1)

## 2020-09-02 LAB — GLUCOSE, CAPILLARY
Glucose-Capillary: 116 mg/dL — ABNORMAL HIGH (ref 70–99)
Glucose-Capillary: 136 mg/dL — ABNORMAL HIGH (ref 70–99)
Glucose-Capillary: 207 mg/dL — ABNORMAL HIGH (ref 70–99)
Glucose-Capillary: 156 mg/dL — ABNORMAL HIGH (ref 70–99)
Glucose-Capillary: 211 mg/dL — ABNORMAL HIGH (ref 70–99)

## 2020-09-02 LAB — HEPATITIS B SURFACE ANTIGEN: Hepatitis B Surface Ag: NONREACTIVE

## 2020-09-02 MED ORDER — CARVEDILOL 12.5 MG PO TABS
12.5000 mg | ORAL_TABLET | Freq: Two times a day (BID) | ORAL | Status: DC
  Administered 2020-09-02 – 2020-09-04 (×4): 12.5 mg via ORAL
  Filled 2020-09-02 (×5): qty 1

## 2020-09-02 MED ORDER — SODIUM CHLORIDE 0.9 % IV SOLN: 2.0000 g | INTRAVENOUS | Status: DC

## 2020-09-02 MED ORDER — CHLORHEXIDINE GLUCONATE CLOTH 2 % EX PADS: 6.0000 | MEDICATED_PAD | Freq: Every day | CUTANEOUS | Status: DC

## 2020-09-02 MED ORDER — CEFTRIAXONE SODIUM 1 G IJ SOLR
2.0000 g | Freq: Once | INTRAMUSCULAR | Status: AC
  Administered 2020-09-02: 2 g via INTRAMUSCULAR
  Filled 2020-09-02: qty 20

## 2020-09-02 NOTE — Plan of Care (Signed)
Patient was at [facility] in [place] for fever and this afternoon was found to have gram negative rod bacteremia   Plan for HD tomorrow AM and then will need to remove the hemodialysis catheter after HD.    Potassium normally high outpatient (she's on a 1K bath outpatient).  Would defer repletion for now   After HD will plan for line holiday and then later this week replacing her tunneled catheter  I spoke with Dr. Constance Haw and she is able to pull the tunneled catheter after dialysis tomorrow AM.  Agree with local anesthetic.   Claudia Desanctis, MD 09/02/2020  4:06 PM

## 2020-09-02 NOTE — Progress Notes (Signed)
[facility]  Patient refuses any additional IV access. I came to the hospital for IV access with central line.  Discussed that she has a bacterial infection in her bloodstream.  Recommendation would be for IV antibiotics.  She says she will only take oral. Updated Dr. Dyann Kief.   He will cover patient with IM medication.  Curlene Labrum, MD [facility] [address] [place], [place] [address] [phone] (office)

## 2020-09-02 NOTE — Progress Notes (Signed)
Pt refused sq heparin injection. MD [facility] notified.

## 2020-09-02 NOTE — Progress Notes (Signed)
Date and time results received: 09/02/20  @ 1210   Test: Blood culture prelim Critical Value: Gram (-) rods in aerobic bottle only  Name of Provider Notified: [place]  Orders Received? Or Actions Taken?: none at present

## 2020-09-02 NOTE — Progress Notes (Signed)
Went in to assess patient for IV placement using ultrasound. Patient told this RN to not place IV in [facility] as she has had multiple placed there that do not last. Assessment of upper and lower arm done using ultrasound. 1 stick attempted and unsuccessful in upper arm. AC assessed to be the only adequate location to place IV however patient is insistent she does not want IV placed there. Patient states she has been through this too often and just left another hospital where this occurred as well. Patient states she knows she has the right to refuse.  Patient is also not a candidate for midline or PICC placement at this time.  Primary RN Crystal informed and notified Dr. Dyann Kief.

## 2020-09-02 NOTE — Plan of Care (Signed)

## 2020-09-02 NOTE — Progress Notes (Signed)
PROGRESS NOTE    Courtney Gray  [id number] DOB: [birth year] DOA: 09/01/2020 PCP: Medicine, [facility]   Chief Complaint  Patient presents with  . Weakness  . Fever    Brief admission narrative:  As per H&P written by Dr. Denton Brick 09/01/2020 Courtney Gray  is a 50 y.o. female with past medical history relevant for morbid obesity, HTN, chronic hypoxic respiratory failure on 2 to 3 L of oxygen via nasal cannula, ESRD on HD Tuesdays Thursdays and Saturdays with last HD session on 08/30/2020, diabetes and HTN who presents to the ED with recurrent fevers -Patient was apparently discharged from [facility] on 08/31/2020 after being admitted on 08/28/2020 for volume overload and hypertensive urgency with BiPAP and IV nitro and diarrhea back-to-back hemodialysis sessions -- As per patient on 08/31/2020 when she got home she had fever above 102 and chills took some Tylenol and felt better -Patient went to hemodialysis session on 09/01/2020 and was found to have a fever above 102 so she was sent for hemodialysis center to the ED for further evaluation -Patient endorses right upper quadrant tenderness with nausea but no emesis last BM 08/31/2020, no diarrhea, no chest pains no palpitations no shortness of breath -Patient denies productive cough -In the ED chest x-ray without acute findings and COVID negative -In ED WBC is 12.0 hemoglobin is 9.2 -Lipase is 35, sodium is 131 chloride is 96 glucose 170 creatinine 11.2 -CT abdomen pelvis ordered and pending -Blood cultures pending -Patient does not make urine --Patient had a new right IJ HD catheter placed on 08/28/2020 after previous HD catheter fell out on 08/25/2020  Assessment & Plan: 1-fever in the setting of gram-negative rods bacteremia -Final speciation and sensitivity pending -Patient has lost IV and is refusing to get central line; antibiotic has been transition to Rocephin 2 g intramuscularly until able to have IV  access. -Nephrology service has denied accessing dialysis catheter for infusions at this moment. -Continue as needed antipyretics -Follow results of CT abdomen -Continue supportive care and follow clinical response.  2-ESRD -Dialysis today Tuesday-Thursday-Saturday -Nephrology service is on board and will follow further recommendations and dialysis orders while inpatient.  3-anemia of chronic kidney disease -IV iron and Epogen therapy as per nephrology service.  4-morbid obesity -Low calorie diet, portion control and increase physical activity has been discussed with patient -Body mass index is 53.12 kg/m.  5-hypertension -Continue to follow vital signs and adjust antihypertensive agents as needed -Soft blood pressure appreciated this morning, patient reported feeling lightheaded and medication has not been adjusted.  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  7-hyperlipidemia -Continue Lipitor.  8-chronic respiratory failure with hypoxia -Continue chronic oxygen supplementation and as needed bronchodilators.   DVT prophylaxis: Heparin Code Status: Full code Family Communication: No family at bedside. Disposition:   Status is: Inpatient  Dispo: The patient is from: Home              Anticipated d/c is to: Home              Patient currently no medically stable for discharge; still spiking fever and now with blood cultures suggesting gram-negative rods bacteremia.   Difficult to place patient no     Consultants:   Nephrology  General surgery (consulted to assist with IV access; something that was refused and declined by patient)   Procedures:  See below for x-ray reports.   Antimicrobials:  Rocephin and vancomycin   Subjective: Still spiking fever overnight; reporting no chest pain, no nausea, no vomiting.  Objective: Vitals:   09/02/20  0519 09/02/20 0524 09/02/20 0800 09/02/20 0835  BP: (!) 153/73  (!) 106/55 125/74  Pulse: 60  (!) 53 (!) 53  Resp: '20  20 20  '$ Temp: 98.6 F (37 C)  97.9 F (36.6 C)   TempSrc: Oral  Oral   SpO2: 98%  100%   Weight: (!) 167.5 kg (!) 144.8 kg    Height:        Intake/Output Summary (Last 24 hours) at 09/02/2020 1414 Last data filed at 09/02/2020 1100 Gross per 24 hour  Intake 980 ml  Output 4000 ml  Net -3020 ml   Filed Weights   09/01/20 2045 09/02/20 0519 09/02/20 0524  Weight: (!) 172.8 kg (!) 167.5 kg (!) 144.8 kg    Examination:  General exam: Appears calm and comfortable; currently afebrile; no chest pain, no nausea, no vomiting.  Overnight still spiking fever. Respiratory system: Clear to auscultation. Respiratory effort normal.  Good oxygen saturations on chronic supplementation. Cardiovascular system: S1 & S2 heard, RRR.  No rubs or gallops appreciated; unable to assess JVD with body habitus. Gastrointestinal system: Abdomen is obese, nondistended, soft and nontender. No organomegaly or masses felt. Normal bowel sounds heard. Central nervous system: Alert and oriented. No focal neurological deficits. Extremities: Cyanosis or clubbing. Skin: No petechiae. Psychiatry: Judgement and insight appear normal. Mood & affect appropriate.     Data Reviewed: I have personally reviewed following labs and imaging studies  CBC: Recent Labs  Lab 08/28/20 1538 08/28/20 1604 08/29/20 1948 08/30/20 0224 08/31/20 0714 09/01/20 1500 09/02/20 0617  WBC 13.0*   < > 11.3* 12.5* 10.9* 12.0* 14.9*  NEUTROABS 10.4*  --   --   --   --  10.0*  --   HGB 10.9*   < > 8.8* 9.3* 9.6* 9.2* 9.2*  HCT 36.0   < > 28.6* 30.0* 30.7* 29.8* 30.2*  MCV 103.2*   < > 101.8* 100.7* 100.3* 103.5* 103.1*  PLT 194   < > 151 158 161 158 153   < > = values in this interval not displayed.    Basic Metabolic Panel: Recent Labs  Lab 08/28/20 2310 08/29/20 1948 08/30/20 0224 08/31/20 0714  09/01/20 1500 09/02/20 0617  NA 137 133* 133* 132* 131* 132*  K 3.9 3.4* 3.6 3.9 3.9 3.3*  CL 99 96* 95* 94* 96* 93*  CO2 '23 25 27 28 24 23  '$ GLUCOSE 78 90 78 192* 179* 144*  BUN 34* 40* 25* 27* 51* 30*  CREATININE 8.43* 9.48* 7.63* 7.44* 11.23* 8.01*  CALCIUM 8.8* 7.9* 8.2* 8.3* 8.0* 8.1*  MG 2.3  --  2.1 2.1  --   --   PHOS 3.6 3.7 4.5 5.1*  --   --     GFR: Estimated Creatinine Clearance: 12.2 mL/min (A) (by C-G formula based on SCr of 8.01 mg/dL (H)).  Liver Function Tests: Recent Labs  Lab 08/28/20 1538 08/29/20 1948 09/01/20 1500 09/02/20 0617  AST 19  --  14* 25  ALT 20  --  17 23  ALKPHOS 58  --  49 52  BILITOT 0.9  --  0.7 0.9  PROT 7.5  --  7.4 7.5  ALBUMIN 3.9 3.3* 3.5 3.6    CBG: Recent Labs  Lab 08/31/20 1128 09/01/20 2017 09/02/20 0133 09/02/20 0736 09/02/20 1145  GLUCAP 212* 114* 116* 136* 207*  Recent Results (from the past 240 hour(s))  Resp Panel by RT-PCR (Flu A&B, Covid) Nasopharyngeal Swab     Status: None   Collection Time: 08/28/20  3:54 PM   Specimen: Nasopharyngeal Swab; Nasopharyngeal(NP) swabs in vial transport medium  Result Value Ref Range Status   SARS Coronavirus 2 by RT PCR NEGATIVE NEGATIVE Final    Comment: (NOTE) SARS-CoV-2 target nucleic acids are NOT DETECTED.  The SARS-CoV-2 RNA is generally detectable in upper respiratory specimens during the acute phase of infection. The lowest concentration of SARS-CoV-2 viral copies this assay can detect is 138 copies/mL. A negative result does not preclude SARS-Cov-2 infection and should not be used as the sole basis for treatment or other patient management decisions. A negative result may occur with  improper specimen collection/handling, submission of specimen other than nasopharyngeal swab, presence of viral mutation(s) within the areas targeted by this assay, and inadequate number of viral copies(<138 copies/mL). A negative result must be combined with clinical  observations, patient history, and epidemiological information. The expected result is Negative.  Fact Sheet for Patients:  EntrepreneurPulse.com.au  Fact Sheet for Healthcare Providers:  IncredibleEmployment.be  This test is no t yet approved or cleared by the [place] FDA and  has been authorized for detection and/or diagnosis of SARS-CoV-2 by FDA under an Emergency Use Authorization (EUA). This EUA will remain  in effect (meaning this test can be used) for the duration of the COVID-19 declaration under Section 564(b)(1) of the Act, 21 U.S.C.section 360bbb-3(b)(1), unless the authorization is terminated  or revoked sooner.       Influenza A by PCR NEGATIVE NEGATIVE Final   Influenza B by PCR NEGATIVE NEGATIVE Final    Comment: (NOTE) The Xpert Xpress SARS-CoV-2/FLU/RSV plus assay is intended as an aid in the diagnosis of influenza from Nasopharyngeal swab specimens and should not be used as a sole basis for treatment. Nasal washings and aspirates are unacceptable for Xpert Xpress SARS-CoV-2/FLU/RSV testing.  Fact Sheet for Patients: EntrepreneurPulse.com.au  Fact Sheet for Healthcare Providers: IncredibleEmployment.be  This test is not yet approved or cleared by the [place] FDA and has been authorized for detection and/or diagnosis of SARS-CoV-2 by FDA under an Emergency Use Authorization (EUA). This EUA will remain in effect (meaning this test can be used) for the duration of the COVID-19 declaration under Section 564(b)(1) of the Act, 21 U.S.C. section 360bbb-3(b)(1), unless the authorization is terminated or revoked.  Performed at [facility], [place] [address] [place], [place] [address]   MRSA PCR Screening     Status: None   Collection Time: 08/28/20 11:48 PM   Specimen: Nasopharyngeal  Result Value Ref Range Status   MRSA by PCR NEGATIVE NEGATIVE Final    Comment:         The GeneXpert MRSA Assay (FDA approved for NASAL specimens only), is one component of a comprehensive MRSA colonization surveillance program. It is not intended to diagnose MRSA infection nor to guide or monitor treatment for MRSA infections. Performed at [facility], [place] [name], [place], [place] [address]   Resp Panel by RT-PCR (Flu A&B, Covid) Nasopharyngeal Swab     Status: None   Collection Time: 09/01/20  1:45 PM   Specimen: Nasopharyngeal Swab; Nasopharyngeal(NP) swabs in vial transport medium  Result Value Ref Range Status   SARS Coronavirus 2 by RT PCR NEGATIVE NEGATIVE Final    Comment: (NOTE) SARS-CoV-2 target nucleic acids are NOT DETECTED.  The SARS-CoV-2 RNA is  generally detectable in upper respiratory specimens during the acute phase of infection. The lowest concentration of SARS-CoV-2 viral copies this assay can detect is 138 copies/mL. A negative result does not preclude SARS-Cov-2 infection and should not be used as the sole basis for treatment or other patient management decisions. A negative result may occur with  improper specimen collection/handling, submission of specimen other than nasopharyngeal swab, presence of viral mutation(s) within the areas targeted by this assay, and inadequate number of viral copies(<138 copies/mL). A negative result must be combined with clinical observations, patient history, and epidemiological information. The expected result is Negative.  Fact Sheet for Patients:  EntrepreneurPulse.com.au  Fact Sheet for Healthcare Providers:  IncredibleEmployment.be  This test is no t yet approved or cleared by the [place] FDA and  has been authorized for detection and/or diagnosis of SARS-CoV-2 by FDA under an Emergency Use Authorization (EUA). This EUA will remain  in effect (meaning this test can be used) for the duration of the COVID-19 declaration under Section 564(b)(1) of the  Act, 21 U.S.C.section 360bbb-3(b)(1), unless the authorization is terminated  or revoked sooner.       Influenza A by PCR NEGATIVE NEGATIVE Final   Influenza B by PCR NEGATIVE NEGATIVE Final    Comment: (NOTE) The Xpert Xpress SARS-CoV-2/FLU/RSV plus assay is intended as an aid in the diagnosis of influenza from Nasopharyngeal swab specimens and should not be used as a sole basis for treatment. Nasal washings and aspirates are unacceptable for Xpert Xpress SARS-CoV-2/FLU/RSV testing.  Fact Sheet for Patients: EntrepreneurPulse.com.au  Fact Sheet for Healthcare Providers: IncredibleEmployment.be  This test is not yet approved or cleared by the [place] FDA and has been authorized for detection and/or diagnosis of SARS-CoV-2 by FDA under an Emergency Use Authorization (EUA). This EUA will remain in effect (meaning this test can be used) for the duration of the COVID-19 declaration under Section 564(b)(1) of the Act, 21 U.S.C. section 360bbb-3(b)(1), unless the authorization is terminated or revoked.  Performed at [facility], [address], [place], [place] [address]   Culture, blood (routine x 2)     Status: None (Preliminary result)   Collection Time: 09/01/20  3:00 PM   Specimen: Right Antecubital; Blood  Result Value Ref Range Status   Specimen Description RIGHT ANTECUBITAL  Final   Special Requests   Final    Blood Culture results may not be optimal due to an inadequate volume of blood received in culture bottles BOTTLES DRAWN AEROBIC AND ANAEROBIC   Culture  Setup Time   Final    GRAM NEGATIVE RODS Gram Stain Report Called to,Read Back By and Verified With: RACHEAL TATE,RN '@1154'$  09/02/2020 KAY AEROBIC BOTTLE ONLY Performed at [facility], [address], [place], [place] [address]    Culture PENDING  Incomplete   Report Status PENDING  Incomplete  Culture, blood (routine x 2)     Status: None (Preliminary result)   Collection  Time: 09/01/20  3:49 PM   Specimen: BLOOD LEFT HAND  Result Value Ref Range Status   Specimen Description BLOOD LEFT HAND  Final   Special Requests   Final    Blood Culture adequate volume BOTTLES DRAWN AEROBIC AND ANAEROBIC   Culture  Setup Time   Final    GRAM NEGATIVE RODS ANAEROBIC BOTTLE ONLY CRYSTAL REYNOLDS,RN '@1306'$  09/02/2020 KAY Performed at [facility], [address] [place], [place] [address]    Culture PENDING  Incomplete   Report Status PENDING  Incomplete  Radiology Studies: DG Chest Portable 1 View  Result Date: 09/01/2020 CLINICAL DATA:  Elevated temperature during dialysis today. Weakness and fever. EXAM: PORTABLE CHEST 1 VIEW COMPARISON:  08/28/2020 and older exams. FINDINGS: Cardiac silhouette normal in size.  No mediastinal or hilar masses. Lungs clear. Opacity noted at the lung bases on the recent prior study is no longer visualized. Lung bases are somewhat suboptimally visualized due to the significant overlying soft tissue. Cannot exclude a small effusion. No pneumothorax. Right anterior chest wall internal jugular tunneled central venous catheter is stable. IMPRESSION: 1. No acute cardiopulmonary disease. Electronically Signed   By: Lajean Manes M.D.   On: 09/01/2020 14:55   Scheduled Meds: . aspirin EC  81 mg Oral Daily  . atorvastatin  10 mg Oral q1800  . carvedilol  12.5 mg Oral BID  . Chlorhexidine Gluconate Cloth  6 each Topical Q0600  . cinacalcet  30 mg Oral Q T,Th,Sa-HD  . doxercalciferol  5 mcg Intravenous Q T,Th,Sa-HD  . famotidine  20 mg Oral Daily  . gabapentin  100 mg Oral 2 times per day on Sun Mon Wed Fri   And  . gabapentin  100 mg Oral 3 times per day on Tue Thu Sat  . heparin  5,000 Units Subcutaneous Q8H  . insulin aspart  0-5 Units Subcutaneous QHS  . insulin aspart  0-6 Units Subcutaneous TID WC  . insulin glargine  12 Units Subcutaneous QHS  . senna-docusate  2 tablet Oral QHS  . sodium chloride flush  3 mL Intravenous Q12H  .  sodium chloride flush  3 mL Intravenous Q12H  . tiZANidine  4 mg Oral Q8H   Continuous Infusions: . sodium chloride    . sodium chloride    . sodium chloride    . ceFEPime (MAXIPIME) IV    . vancomycin       LOS: 0 days    Time spent: 30 minutes  Barton Dubois, MD [facility]   To contact the attending provider between 7A-7P or the covering provider during after hours 7P-7A, please log into the web site [email or web address] and access using universal [place] password for that web site. If you do not have the password, please call the hospital operator.  09/02/2020, 2:14 PM

## 2020-09-03 ENCOUNTER — Inpatient Hospital Stay (HOSPITAL_COMMUNITY): Payer: Medicare Other

## 2020-09-03 DIAGNOSIS — N186 End stage renal disease: Secondary | ICD-10-CM | POA: Diagnosis not present

## 2020-09-03 DIAGNOSIS — J9611 Chronic respiratory failure with hypoxia: Secondary | ICD-10-CM | POA: Diagnosis not present

## 2020-09-03 DIAGNOSIS — D638 Anemia in other chronic diseases classified elsewhere: Secondary | ICD-10-CM | POA: Diagnosis not present

## 2020-09-03 DIAGNOSIS — R509 Fever, unspecified: Secondary | ICD-10-CM | POA: Diagnosis not present

## 2020-09-03 DIAGNOSIS — R7881 Bacteremia: Secondary | ICD-10-CM

## 2020-09-03 LAB — CBC
HCT: 28.6 % — ABNORMAL LOW (ref 36.0–46.0)
Hemoglobin: 8.9 g/dL — ABNORMAL LOW (ref 12.0–15.0)
MCH: 31.8 pg (ref 26.0–34.0)
MCHC: 31.1 g/dL (ref 30.0–36.0)
MCV: 102.1 fL — ABNORMAL HIGH (ref 80.0–100.0)
Platelets: 158 10*3/uL (ref 150–400)
RBC: 2.8 MIL/uL — ABNORMAL LOW (ref 3.87–5.11)
RDW: 13.2 % (ref 11.5–15.5)
WBC: 10 10*3/uL (ref 4.0–10.5)
nRBC: 0 % (ref 0.0–0.2)

## 2020-09-03 LAB — RENAL FUNCTION PANEL
Albumin: 3.4 g/dL — ABNORMAL LOW (ref 3.5–5.0)
Anion gap: 13 (ref 5–15)
BUN: 54 mg/dL — ABNORMAL HIGH (ref 6–20)
CO2: 25 mmol/L (ref 22–32)
Calcium: 7.8 mg/dL — ABNORMAL LOW (ref 8.9–10.3)
Chloride: 91 mmol/L — ABNORMAL LOW (ref 98–111)
Creatinine, Ser: 11.37 mg/dL — ABNORMAL HIGH (ref 0.44–1.00)
GFR, Estimated: 4 mL/min — ABNORMAL LOW (ref >60)
Glucose, Bld: 239 mg/dL — ABNORMAL HIGH (ref 70–99)
Phosphorus: 5.9 mg/dL — ABNORMAL HIGH (ref 2.5–4.6)
Potassium: 3.5 mmol/L (ref 3.5–5.1)
Sodium: 129 mmol/L — ABNORMAL LOW (ref 135–145)

## 2020-09-03 LAB — GLUCOSE, CAPILLARY
Glucose-Capillary: 116 mg/dL — ABNORMAL HIGH (ref 70–99)
Glucose-Capillary: 132 mg/dL — ABNORMAL HIGH (ref 70–99)
Glucose-Capillary: 214 mg/dL — ABNORMAL HIGH (ref 70–99)

## 2020-09-03 MED ORDER — DARBEPOETIN ALFA 100 MCG/0.5ML IJ SOSY
100.0000 ug | PREFILLED_SYRINGE | Freq: Once | INTRAMUSCULAR | Status: AC
  Administered 2020-09-06: 100 ug via INTRAVENOUS
  Filled 2020-09-03: qty 0.5

## 2020-09-03 MED ORDER — SODIUM CHLORIDE 0.9 % IV SOLN
1.0000 g | Freq: Once | INTRAVENOUS | Status: AC
  Administered 2020-09-03: 1 g via INTRAVENOUS
  Filled 2020-09-03 (×2): qty 1

## 2020-09-03 MED ORDER — ALPRAZOLAM 0.5 MG PO TABS
0.5000 mg | ORAL_TABLET | Freq: Once | ORAL | Status: AC
  Administered 2020-09-03: 0.5 mg via ORAL
  Filled 2020-09-03: qty 1

## 2020-09-03 MED ORDER — LIDOCAINE-EPINEPHRINE (PF) 2 %-1:200000 IJ SOLN
20.0000 mL | Freq: Once | INTRAMUSCULAR | Status: DC
  Filled 2020-09-03: qty 20

## 2020-09-03 MED ORDER — HEPARIN SODIUM (PORCINE) 1000 UNIT/ML IJ SOLN
INTRAMUSCULAR | Status: AC
  Filled 2020-09-03: qty 6

## 2020-09-03 MED ORDER — SODIUM CHLORIDE 0.9 % IV SOLN
1.0000 g | INTRAVENOUS | Status: DC
  Administered 2020-09-05: 1 g via INTRAVENOUS
  Filled 2020-09-03 (×3): qty 1

## 2020-09-03 MED ORDER — AMLODIPINE BESYLATE 5 MG PO TABS
10.0000 mg | ORAL_TABLET | Freq: Every day | ORAL | Status: DC
  Administered 2020-09-03 – 2020-09-07 (×5): 10 mg via ORAL
  Filled 2020-09-03 (×5): qty 2

## 2020-09-03 NOTE — Procedures (Signed)
Procedure Note  09/03/20    Preoperative Diagnosis: ESRD on dialysis, tunneled right internal jugular in place, bacteremia, poor IV access    Postoperative Diagnosis: Same   Procedure(s) Performed: Removal of tunneled line from right internal jugular vein, placement of external jugular left intravenous access    Surgeon: Lanell Matar. Constance Haw, MD   Assistants: None   Anesthesia: 1% lidocaine   Complications: None    Indications: Courtney Gray is a 50 y.o. with ESRD on dialysis who has a tunneled line in place for dialysis and bacteremia. She needs the tunneled line removed but also needs access due to inability to get an intravenous access. We discussed removal of the tunneled line and risk of bleeding, infection, needing additional access and risk of placement of a new central line including bleeding, infection pneumothorax. We discussed attempting external jugular access on the left prior to central access and she agreed.  A JACHO approved time out was performed.   Procedure: The patient placed semi upright, and the right tunneled catheter bandage was removed. The area was prepped and lidocaine 1% with epinephrine was injected around the area. The cuff of the tunneled catheter was dissected out with scissors. My assistant held pressure at the site of insertion of the right internal jugular, and I removed the catheter. A gauze and tegaderm dressing were placed on the exit site.  Pressure was held for 15 minutes on the internal jugular puncture site. No hematoma or bleeding was noted.   From here the external jugular on the left was identified with ultrasound.  With RN assistance the left external jugular vein was accessed with a 20 gauge IV which flushed and drew back blood. This will allow the patient to have a line holiday to clear her bacteremia.    The patient tolerated the procedure well, and the CXR was ordered to confirm removal of the tunneled line.    Courtney Labrum, MD [facility] [address] [place], [place] [address] [phone] (office)

## 2020-09-03 NOTE — Progress Notes (Signed)
[facility]  CXR documented tunneled catheter removed.  No complications.   Curlene Labrum, MD [facility] [address] [place], [place] [address] [phone] (office)

## 2020-09-03 NOTE — Progress Notes (Signed)
PROGRESS NOTE    Courtney Gray  [id number] DOB: 1970-10-23 DOA: 09/01/2020 PCP: Medicine, [facility]   Chief Complaint  Patient presents with  . Weakness  . Fever    Brief admission narrative:  As per H&P written by Dr. Denton Brick 09/01/2020 Courtney Gray  is a 50 y.o. female with past medical history relevant for morbid obesity, HTN, chronic hypoxic respiratory failure on 2 to 3 L of oxygen via nasal cannula, ESRD on HD Tuesdays Thursdays and Saturdays with last HD session on 08/30/2020, diabetes and HTN who presents to the ED with recurrent fevers -Patient was apparently discharged from [facility] on 08/31/2020 after being admitted on 08/28/2020 for volume overload and hypertensive urgency with BiPAP and IV nitro and diarrhea back-to-back hemodialysis sessions -- As per patient on 08/31/2020 when she got home she had fever above 102 and chills took some Tylenol and felt better -Patient went to hemodialysis session on 09/01/2020 and was found to have a fever above 102 so she was sent for hemodialysis center to the ED for further evaluation -Patient endorses right upper quadrant tenderness with nausea but no emesis last BM 08/31/2020, no diarrhea, no chest pains no palpitations no shortness of breath -Patient denies productive cough -In the ED chest x-ray without acute findings and COVID negative -In ED WBC is 12.0 hemoglobin is 9.2 -Lipase is 35, sodium is 131 chloride is 96 glucose 170 creatinine 11.2 -CT abdomen pelvis ordered and pending -Blood cultures pending -Patient does not make urine --Patient had a new right IJ HD catheter placed on 08/28/2020 after previous HD catheter fell out on 08/25/2020  Assessment & Plan: 1-fever in the setting of gram-negative rods/Pseudomonas bacteremia -Final speciation and sensitivity pending -Patient with positive Pseudomonas bacteremia as per blood culture results -Will receive dialysis today in anticipation for dialysis catheter removal  and line holiday -Repeating blood cultures on 09/04/2020 -Continue IV cefepime. Maybe Tressie Ellis to be continue later on as an outpatient to complete therapy. -Continue as needed antipyretics -CT abdomen not demonstrating source of infection. -Continue supportive care and follow clinical response.  2-ESRD -Patient chronically on Tuesday-Thursday-Saturday dialysis schedule; will have hemodialysis today in anticipation of catheter removal and line holiday. -Nephrology service is on board and we will continue to follow further recommendations and dialysis orders while inpatient.  3-anemia of chronic kidney disease -IV iron and Epogen therapy as per nephrology service. -Hemoglobin stable and no signs of overt bleeding currently.  4-morbid obesity -Low calorie diet, portion control and increase physical activity has been discussed with patient -Body mass index is 60.39 kg/m.  5-hypertension -Continue to follow vital signs and adjust antihypertensive agents as needed -Soft blood pressure appreciated this morning, patient reported feeling lightheaded and medication has not been adjusted.  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  7-hyperlipidemia -Continue Lipitor.  8-chronic respiratory failure with hypoxia -Continue chronic oxygen supplementation and as needed bronchodilators. -Good saturation on chronic supplementation.  Reports no shortness of breath.   DVT prophylaxis: Heparin Code Status: Full code Family Communication: No family at bedside. Disposition:   Status is: Inpatient  Dispo: The patient is from: Home              Anticipated d/c is to: Home              Patient currently no medically stable for discharge; still spiking fever and now with blood cultures suggesting gram-negative  rods bacteremia.   Difficult to place patient no     Consultants:    Nephrology  General surgery (consulted to assist with IV access and for removal of previous dialysis catheter).   Procedures:  See below for x-ray reports.   Antimicrobials:  Initially Rocephin and vancomycin; based on culture results patient initiated on cefepime. Vancomycin discontinued 09/03/2020   Subjective: No fever in the last 24 hours appreciated; denies chest pain, no nausea, no vomiting, no palpitations or shortness of breath.  Objective: Vitals:   09/03/20 1130 09/03/20 1150 09/03/20 1156 09/03/20 1346  BP: (!) 220/77 (!) 193/77  (!) 185/63  Pulse: 61 63  66  Resp:   16 18  Temp:   98.8 F (37.1 C) 99.6 F (37.6 C)  TempSrc:   Oral Oral  SpO2:    100%  Weight:      Height:        Intake/Output Summary (Last 24 hours) at 09/03/2020 1656 Last data filed at 09/03/2020 1120 Gross per 24 hour  Intake 240 ml  Output 1923 ml  Net -1683 ml   Filed Weights   09/02/20 0524 09/03/20 0500 09/03/20 0700  Weight: (!) 144.8 kg (!) 164.6 kg (!) 164.6 kg    Examination: General exam: Alert, awake, oriented x 3; currently afebrile, no chest pain, no nausea, no vomiting, no palpitations and expressed no acute distress.  Patient slightly frustrated and overwhelmed with ongoing COVID infection and he needs for hemodialysis catheter removal and new need for IV access. Respiratory system: Good air movement bilaterally; no wheezing or crackles on exam.  No using accessory muscles.  Good oxygen saturation on chronic supplementation. Cardiovascular system:RRR. No murmurs, rubs, gallops.  Unable to assess JVD with body habitus. Gastrointestinal system: Abdomen is obese, nondistended, soft and nontender. No organomegaly or masses felt. Normal bowel sounds heard. Central nervous system: Alert and oriented. No focal neurological deficits. Extremities: No cyanosis or clubbing. Skin: No petechiae. Psychiatry: Judgement and insight appear normal. Mood & affect appropriate.   Data  Reviewed: I have personally reviewed following labs and imaging studies  CBC: Recent Labs  Lab 08/28/20 1538 08/28/20 1604 08/30/20 0224 08/31/20 0714 09/01/20 1500 09/02/20 0617 09/03/20 0914  WBC 13.0*   < > 12.5* 10.9* 12.0* 14.9* 10.0  NEUTROABS 10.4*  --   --   --  10.0*  --   --   HGB 10.9*   < > 9.3* 9.6* 9.2* 9.2* 8.9*  HCT 36.0   < > 30.0* 30.7* 29.8* 30.2* 28.6*  MCV 103.2*   < > 100.7* 100.3* 103.5* 103.1* 102.1*  PLT 194   < > 158 161 158 153 158   < > = values in this interval not displayed.    Basic Metabolic Panel: Recent Labs  Lab 08/28/20 2310 08/29/20 1948 08/30/20 0224 08/31/20 0714 09/01/20 1500 09/02/20 0617 09/03/20 0914  NA 137 133* 133* 132* 131* 132* 129*  K 3.9 3.4* 3.6 3.9 3.9 3.3* 3.5  CL 99 96* 95* 94* 96* 93* 91*  CO2 '23 25 27 28 24 23 25  '$ GLUCOSE 78 90 78 192* 179* 144* 239*  BUN 34* 40* 25* 27* 51* 30* 54*  CREATININE 8.43* 9.48* 7.63* 7.44* 11.23* 8.01* 11.37*  CALCIUM 8.8* 7.9* 8.2* 8.3* 8.0* 8.1* 7.8*  MG 2.3  --  2.1 2.1  --   --   --   PHOS 3.6 3.7 4.5 5.1*  --   --  5.9*    GFR:  Estimated Creatinine Clearance: 9.3 mL/min (A) (by C-G formula based on SCr of 11.37 mg/dL (H)).  Liver Function Tests: Recent Labs  Lab 08/28/20 1538 08/29/20 1948 09/01/20 1500 09/02/20 0617 09/03/20 0914  AST 19  --  14* 25  --   ALT 20  --  17 23  --   ALKPHOS 58  --  49 52  --   BILITOT 0.9  --  0.7 0.9  --   PROT 7.5  --  7.4 7.5  --   ALBUMIN 3.9 3.3* 3.5 3.6 3.4*   CBG: Recent Labs  Lab 09/02/20 0736 09/02/20 1145 09/02/20 1623 09/02/20 2129 09/03/20 1604  GLUCAP 136* 207* 156* 211* 132*    Recent Results (from the past 240 hour(s))  Resp Panel by RT-PCR (Flu A&B, Covid) Nasopharyngeal Swab     Status: None   Collection Time: 08/28/20  3:54 PM   Specimen: Nasopharyngeal Swab; Nasopharyngeal(NP) swabs in vial transport medium  Result Value Ref Range Status   SARS Coronavirus 2 by RT PCR NEGATIVE NEGATIVE Final     Comment: (NOTE) SARS-CoV-2 target nucleic acids are NOT DETECTED.  The SARS-CoV-2 RNA is generally detectable in upper respiratory specimens during the acute phase of infection. The lowest concentration of SARS-CoV-2 viral copies this assay can detect is 138 copies/mL. A negative result does not preclude SARS-Cov-2 infection and should not be used as the sole basis for treatment or other patient management decisions. A negative result may occur with  improper specimen collection/handling, submission of specimen other than nasopharyngeal swab, presence of viral mutation(s) within the areas targeted by this assay, and inadequate number of viral copies(<138 copies/mL). A negative result must be combined with clinical observations, patient history, and epidemiological information. The expected result is Negative.  Fact Sheet for Patients:  EntrepreneurPulse.com.au  Fact Sheet for Healthcare Providers:  IncredibleEmployment.be  This test is no t yet approved or cleared by the [place] FDA and  has been authorized for detection and/or diagnosis of SARS-CoV-2 by FDA under an Emergency Use Authorization (EUA). This EUA will remain  in effect (meaning this test can be used) for the duration of the COVID-19 declaration under Section 564(b)(1) of the Act, 21 U.S.C.section 360bbb-3(b)(1), unless the authorization is terminated  or revoked sooner.       Influenza A by PCR NEGATIVE NEGATIVE Final   Influenza B by PCR NEGATIVE NEGATIVE Final    Comment: (NOTE) The Xpert Xpress SARS-CoV-2/FLU/RSV plus assay is intended as an aid in the diagnosis of influenza from Nasopharyngeal swab specimens and should not be used as a sole basis for treatment. Nasal washings and aspirates are unacceptable for Xpert Xpress SARS-CoV-2/FLU/RSV testing.  Fact Sheet for Patients: EntrepreneurPulse.com.au  Fact Sheet for Healthcare  Providers: IncredibleEmployment.be  This test is not yet approved or cleared by the [place] FDA and has been authorized for detection and/or diagnosis of SARS-CoV-2 by FDA under an Emergency Use Authorization (EUA). This EUA will remain in effect (meaning this test can be used) for the duration of the COVID-19 declaration under Section 564(b)(1) of the Act, 21 U.S.C. section 360bbb-3(b)(1), unless the authorization is terminated or revoked.  Performed at [facility], [place] [address] [place], [place] [address]   MRSA PCR Screening     Status: None   Collection Time: 08/28/20 11:48 PM   Specimen: Nasopharyngeal  Result Value Ref Range Status   MRSA by PCR NEGATIVE NEGATIVE Final    Comment:  The GeneXpert MRSA Assay (FDA approved for NASAL specimens only), is one component of a comprehensive MRSA colonization surveillance program. It is not intended to diagnose MRSA infection nor to guide or monitor treatment for MRSA infections. Performed at [facility], [place] [address] [place], [place] [address]   Resp Panel by RT-PCR (Flu A&B, Covid) Nasopharyngeal Swab     Status: None   Collection Time: 09/01/20  1:45 PM   Specimen: Nasopharyngeal Swab; Nasopharyngeal(NP) swabs in vial transport medium  Result Value Ref Range Status   SARS Coronavirus 2 by RT PCR NEGATIVE NEGATIVE Final    Comment: (NOTE) SARS-CoV-2 target nucleic acids are NOT DETECTED.  The SARS-CoV-2 RNA is generally detectable in upper respiratory specimens during the acute phase of infection. The lowest concentration of SARS-CoV-2 viral copies this assay can detect is 138 copies/mL. A negative result does not preclude SARS-Cov-2 infection and should not be used as the sole basis for treatment or other patient management decisions. A negative result may occur with  improper specimen collection/handling, submission of specimen other than nasopharyngeal swab, presence of  viral mutation(s) within the areas targeted by this assay, and inadequate number of viral copies(<138 copies/mL). A negative result must be combined with clinical observations, patient history, and epidemiological information. The expected result is Negative.  Fact Sheet for Patients:  EntrepreneurPulse.com.au  Fact Sheet for Healthcare Providers:  IncredibleEmployment.be  This test is no t yet approved or cleared by the [place] FDA and  has been authorized for detection and/or diagnosis of SARS-CoV-2 by FDA under an Emergency Use Authorization (EUA). This EUA will remain  in effect (meaning this test can be used) for the duration of the COVID-19 declaration under Section 564(b)(1) of the Act, 21 U.S.C.section 360bbb-3(b)(1), unless the authorization is terminated  or revoked sooner.       Influenza A by PCR NEGATIVE NEGATIVE Final   Influenza B by PCR NEGATIVE NEGATIVE Final    Comment: (NOTE) The Xpert Xpress SARS-CoV-2/FLU/RSV plus assay is intended as an aid in the diagnosis of influenza from Nasopharyngeal swab specimens and should not be used as a sole basis for treatment. Nasal washings and aspirates are unacceptable for Xpert Xpress SARS-CoV-2/FLU/RSV testing.  Fact Sheet for Patients: EntrepreneurPulse.com.au  Fact Sheet for Healthcare Providers: IncredibleEmployment.be  This test is not yet approved or cleared by the [place] FDA and has been authorized for detection and/or diagnosis of SARS-CoV-2 by FDA under an Emergency Use Authorization (EUA). This EUA will remain in effect (meaning this test can be used) for the duration of the COVID-19 declaration under Section 564(b)(1) of the Act, 21 U.S.C. section 360bbb-3(b)(1), unless the authorization is terminated or revoked.  Performed at [facility], [address], [place], [place] [address]   Culture, blood (routine x 2)      Status: Abnormal (Preliminary result)   Collection Time: 09/01/20  3:00 PM   Specimen: Right Antecubital; Blood  Result Value Ref Range Status   Specimen Description   Final    RIGHT ANTECUBITAL Performed at [facility], [address] [place], [place] [address]    Special Requests   Final    Blood Culture results may not be optimal due to an inadequate volume of blood received in culture bottles BOTTLES DRAWN AEROBIC AND ANAEROBIC Performed at [facility], [address], [place], [place] [address]    Culture  Setup Time   Final    GRAM NEGATIVE RODS Gram Stain Report Called to,Read Back  By and Verified With: RACHEAL TATE,RN '@1154'$  09/02/2020 KAY AEROBIC BOTTLE ONLY CRITICAL RESULT CALLED TO, READ BACK BY AND VERIFIED WITH: CRYSTAL REYNOLDS RN '@1625'$  09/02/20 EB    Culture (A)  Final    PSEUDOMONAS AERUGINOSA SUSCEPTIBILITIES TO FOLLOW Performed at [facility], [address], [place], [place] [address]    Report Status PENDING  Incomplete  Blood Culture ID Panel (Reflexed)     Status: Abnormal   Collection Time: 09/01/20  3:00 PM  Result Value Ref Range Status   Enterococcus faecalis NOT DETECTED NOT DETECTED Final   Enterococcus Faecium NOT DETECTED NOT DETECTED Final   Listeria monocytogenes NOT DETECTED NOT DETECTED Final   Staphylococcus species NOT DETECTED NOT DETECTED Final   Staphylococcus aureus (BCID) NOT DETECTED NOT DETECTED Final   Staphylococcus epidermidis NOT DETECTED NOT DETECTED Final   Staphylococcus lugdunensis NOT DETECTED NOT DETECTED Final   Streptococcus species NOT DETECTED NOT DETECTED Final   Streptococcus agalactiae NOT DETECTED NOT DETECTED Final   Streptococcus pneumoniae NOT DETECTED NOT DETECTED Final   Streptococcus pyogenes NOT DETECTED NOT DETECTED Final   A.calcoaceticus-baumannii NOT DETECTED NOT DETECTED Final   Bacteroides fragilis NOT DETECTED NOT DETECTED Final   Enterobacterales NOT DETECTED NOT DETECTED Final   Enterobacter  cloacae complex NOT DETECTED NOT DETECTED Final   Escherichia coli NOT DETECTED NOT DETECTED Final   Klebsiella aerogenes NOT DETECTED NOT DETECTED Final   Klebsiella oxytoca NOT DETECTED NOT DETECTED Final   Klebsiella pneumoniae NOT DETECTED NOT DETECTED Final   Proteus species NOT DETECTED NOT DETECTED Final   Salmonella species NOT DETECTED NOT DETECTED Final   Serratia marcescens NOT DETECTED NOT DETECTED Final   Haemophilus influenzae NOT DETECTED NOT DETECTED Final   Neisseria meningitidis NOT DETECTED NOT DETECTED Final   Pseudomonas aeruginosa DETECTED (A) NOT DETECTED Final    Comment: CRITICAL RESULT CALLED TO, READ BACK BY AND VERIFIED WITH: CRYSTAL REYNOLDS RN '@1625'$  09/02/20 EB    Stenotrophomonas maltophilia NOT DETECTED NOT DETECTED Final   Candida albicans NOT DETECTED NOT DETECTED Final   Candida auris NOT DETECTED NOT DETECTED Final   Candida glabrata NOT DETECTED NOT DETECTED Final   Candida krusei NOT DETECTED NOT DETECTED Final   Candida parapsilosis NOT DETECTED NOT DETECTED Final   Candida tropicalis NOT DETECTED NOT DETECTED Final   Cryptococcus neoformans/gattii NOT DETECTED NOT DETECTED Final   CTX-M ESBL NOT DETECTED NOT DETECTED Final   Carbapenem resistance IMP NOT DETECTED NOT DETECTED Final   Carbapenem resistance KPC NOT DETECTED NOT DETECTED Final   Carbapenem resistance NDM NOT DETECTED NOT DETECTED Final   Carbapenem resistance VIM NOT DETECTED NOT DETECTED Final    Comment: Performed at [facility], [address] [place], [place] [address]  Culture, blood (routine x 2)     Status: Abnormal (Preliminary result)   Collection Time: 09/01/20  3:49 PM   Specimen: BLOOD LEFT HAND  Result Value Ref Range Status   Specimen Description   Final    BLOOD LEFT HAND Performed at [facility], [address], [place], [place] [address]    Special Requests   Final    Blood Culture adequate volume BOTTLES DRAWN AEROBIC AND ANAEROBIC Performed at [facility], [address], [place], [place] [address]    Culture  Setup Time   Final    GRAM NEGATIVE RODS ANAEROBIC BOTTLE ONLY CRYSTAL REYNOLDS,RN '@1306'$  09/02/2020 KAY ANAEROBIC BOTTLE GRAM NEGATIVE RODS RESULT PREVIOUSLY CALLED Performed at [facility], [address] [place], [place] [address]    Culture PSEUDOMONAS AERUGINOSA (A)  Final   Report Status PENDING  Incomplete     Radiology Studies: CT ABDOMEN PELVIS WO CONTRAST  Result Date: 09/02/2020 CLINICAL DATA:  Abdominal pain and fever EXAM: CT ABDOMEN AND PELVIS WITHOUT CONTRAST TECHNIQUE: Multidetector CT imaging of the abdomen and pelvis was performed following the standard protocol without IV contrast. COMPARISON:  04/08/2020 FINDINGS: Lower chest: Mild scarring is noted in the lung bases bilaterally. No sizable effusion is seen. Hepatobiliary: No focal liver abnormality is seen. No gallstones, gallbladder wall thickening, or biliary dilatation. Pancreas: Unremarkable. No pancreatic ductal dilatation or surrounding inflammatory changes. Spleen: Normal in size without focal abnormality. Adrenals/Urinary Tract: Adrenal glands are within normal limits. Kidneys demonstrate mild atrophy bilaterally. Perinephric stranding is noted similar to that seen on prior exams. Vascular calcifications are noted. No obstructive changes are seen. Bladder is decompressed. Stomach/Bowel: The appendix is well visualized and within normal limits. Colon shows no obstructive or inflammatory changes. Small bowel and stomach appear within normal limits. Vascular/Lymphatic: Aortic atherosclerosis. No enlarged abdominal or pelvic lymph nodes. Reproductive: Uterus and bilateral adnexa are unremarkable. Other: No abdominal wall hernia or abnormality. No abdominopelvic ascites. Musculoskeletal: Degenerative changes of lumbar spine are noted. IMPRESSION: Renal atrophy similar to that seen on the prior exam. No acute abnormality noted. Electronically Signed   By: Inez Catalina M.D.   On: 09/02/2020 15:47   DG Chest Port 1 View  Result Date: 09/03/2020 CLINICAL DATA:  Status post dialysis catheter removal EXAM: PORTABLE CHEST 1 VIEW COMPARISON:  09/01/2020 FINDINGS: Right jugular dialysis catheter has been removed in the interval. Cardiac shadow remains prominent. Lungs are well aerated bilaterally. No focal infiltrate or effusion is seen. No acute bony abnormality is noted. IMPRESSION: No acute abnormality noted. Electronically Signed   By: Inez Catalina M.D.   On: 09/03/2020 15:39   Scheduled Meds: . amLODipine  10 mg Oral Daily  . aspirin EC  81 mg Oral Daily  . atorvastatin  10 mg Oral q1800  . carvedilol  12.5 mg Oral BID  . Chlorhexidine Gluconate Cloth  6 each Topical Q0600  . cinacalcet  30 mg Oral Q T,Th,Sa-HD  . darbepoetin (ARANESP) injection - DIALYSIS  100 mcg Intravenous Once  . doxercalciferol  5 mcg Intravenous Q T,Th,Sa-HD  . famotidine  20 mg Oral Daily  . gabapentin  100 mg Oral 2 times per day on Sun Mon Wed Fri   And  . gabapentin  100 mg Oral 3 times per day on Tue Thu Sat  . heparin  5,000 Units Subcutaneous Q8H  . heparin sodium (porcine)      . insulin aspart  0-5 Units Subcutaneous QHS  . insulin aspart  0-6 Units Subcutaneous TID WC  . insulin glargine  12 Units Subcutaneous QHS  . lidocaine-EPINEPHrine  20 mL Intradermal Once  . senna-docusate  2 tablet Oral QHS  . sodium chloride flush  3 mL Intravenous Q12H  . sodium chloride flush  3 mL Intravenous Q12H  . tiZANidine  4 mg Oral Q8H   Continuous Infusions: . sodium chloride    . sodium chloride    . sodium chloride    . [START ON 09/04/2020] ceFEPime (MAXIPIME) IV       LOS: 1 day    Time spent: 30 minutes  Barton Dubois, MD [facility]   To contact the attending provider between 7A-7P or the covering provider during  after hours 7P-7A, please log into the web site [email or web address] and access using universal [place] password for that web site. If you do  not have the password, please call the hospital operator.  09/03/2020, 4:56 PM

## 2020-09-03 NOTE — Progress Notes (Addendum)
[facility] NEPHROLOGY PROGRESS NOTE  Assessment/ Plan: Pt is a 50 y.o. yo female with morbid obesity, DM, HTN, ESRD on HD TTS at [facility] was admitted with fever, seen as a consultation for ESRD management.  Outpatient HD: [name], TTS, EDW 167.5 kg, 1K/2.5 Ca,Heparin 2600 units bolus then 600 unit hourly,4 hours,BF 400,DF 500,RIJ catheter Hectoral 28mg IV/HD Epogen 7000Units IV/HD  Othercinacalcet 30 mg every HD  #Acute febrile illness due to Pseudomonas bacteremia related with dialysis catheter.  Currently on IV cefepime with dialysis.  Plan to remove tunneled HD catheter today by Dr BConstance Hawafter the dialysis session.  Recommend catheter holiday for few days and repeat culture in the morning.  If the repeat culture is negative for 48 hours then I think it is safe to place another HD catheter.  I have discussed this with the patient at the HD unit and her daughter over the phone.  Both of them agreed with the plan.  May consider ID consult/curbside if they are not available at this hospital.  # ESRD TTS: Off schedule dialysis today because of need of discontinuing HD catheter.  Dr. BConstance Hawis following for the access needs, appreciated.  Most likely next dialysis will be on Thursday. Patient has some edema and elevated blood pressure however she is not willing to UF more than 2 kg.  Education provided to the patient.  Recommend fluid restriction.  # Anemia: Monitor hemoglobin.  Order a dose of Aranesp today.  No iron because of bacteremia.  # Secondary hyperparathyroidism: Monitor calcium and phosphorus level.  Given mild hypercalcemia I will hold Sensipar.  # HTN/volume: Elevated blood pressure and volume up.  Patient refused UF more than 2 L today.  Blood pressure is variable which she attributes to stress.  Continue carvedilol and I will resume amlodipine ( home med).   #Hyponatremia, hypervolemic: Managed with dialysis.  Fluid restriction.  Monitor  lab.  Discussed with the primary team and dialysis nurse ALevada Dy  Subjective: Seen and examined in dialysis room.  Patient was frustrated with multiple attempts of placing IV access, hospitalization etc.  I explained about bacteremia, need for catheter removal, catheter holiday, reinsertion of catheter after few days etc. with the patient.  Her daughter was over the phone.  She denies chest pain, shortness of breath, nausea or vomiting.  Blood pressure was variable throughout the dialysis. Objective Vital signs in last 24 hours: Vitals:   09/03/20 1015 09/03/20 1030 09/03/20 1045 09/03/20 1100  BP: (!) 217/77 (!) 225/87 (!) 215/75 (!) 217/76  Pulse: (!) 59 60 (!) 58 60  Resp:      Temp:      TempSrc:      SpO2:      Weight:      Height:       Weight change: -5.4 kg  Intake/Output Summary (Last 24 hours) at 09/03/2020 1109 Last data filed at 09/02/2020 1900 Gross per 24 hour  Intake 720 ml  Output --  Net 720 ml       Labs: Basic Metabolic Panel: Recent Labs  Lab 08/30/20 0224 08/31/20 0714 09/01/20 1500 09/02/20 0617 09/03/20 0914  NA 133* 132* 131* 132* 129*  K 3.6 3.9 3.9 3.3* 3.5  CL 95* 94* 96* 93* 91*  CO2 '27 28 24 23 25  '$ GLUCOSE 78 192* 179* 144* 239*  BUN 25* 27* 51* 30* 54*  CREATININE 7.63* 7.44* 11.23* 8.01* 11.37*  CALCIUM 8.2* 8.3* 8.0* 8.1* 7.8*  PHOS 4.5  5.1*  --   --  5.9*   Liver Function Tests: Recent Labs  Lab 08/28/20 1538 08/29/20 1948 09/01/20 1500 09/02/20 0617 09/03/20 0914  AST 19  --  14* 25  --   ALT 20  --  17 23  --   ALKPHOS 58  --  49 52  --   BILITOT 0.9  --  0.7 0.9  --   PROT 7.5  --  7.4 7.5  --   ALBUMIN 3.9   < > 3.5 3.6 3.4*   < > = values in this interval not displayed.   Recent Labs  Lab 09/01/20 1500  LIPASE 35   No results for input(s): AMMONIA in the last 168 hours. CBC: Recent Labs  Lab 08/28/20 1538 08/28/20 1604 08/30/20 0224 08/31/20 0714 09/01/20 1500 09/02/20 0617 09/03/20 0914  WBC 13.0*    < > 12.5* 10.9* 12.0* 14.9* 10.0  NEUTROABS 10.4*  --   --   --  10.0*  --   --   HGB 10.9*   < > 9.3* 9.6* 9.2* 9.2* 8.9*  HCT 36.0   < > 30.0* 30.7* 29.8* 30.2* 28.6*  MCV 103.2*   < > 100.7* 100.3* 103.5* 103.1* 102.1*  PLT 194   < > 158 161 158 153 158   < > = values in this interval not displayed.   Cardiac Enzymes: No results for input(s): CKTOTAL, CKMB, CKMBINDEX, TROPONINI in the last 168 hours. CBG: Recent Labs  Lab 09/02/20 0133 09/02/20 0736 09/02/20 1145 09/02/20 1623 09/02/20 2129  GLUCAP 116* 136* 207* 156* 211*    Iron Studies: No results for input(s): IRON, TIBC, TRANSFERRIN, FERRITIN in the last 72 hours. Studies/Results: CT ABDOMEN PELVIS WO CONTRAST  Result Date: 09/02/2020 CLINICAL DATA:  Abdominal pain and fever EXAM: CT ABDOMEN AND PELVIS WITHOUT CONTRAST TECHNIQUE: Multidetector CT imaging of the abdomen and pelvis was performed following the standard protocol without IV contrast. COMPARISON:  04/08/2020 FINDINGS: Lower chest: Mild scarring is noted in the lung bases bilaterally. No sizable effusion is seen. Hepatobiliary: No focal liver abnormality is seen. No gallstones, gallbladder wall thickening, or biliary dilatation. Pancreas: Unremarkable. No pancreatic ductal dilatation or surrounding inflammatory changes. Spleen: Normal in size without focal abnormality. Adrenals/Urinary Tract: Adrenal glands are within normal limits. Kidneys demonstrate mild atrophy bilaterally. Perinephric stranding is noted similar to that seen on prior exams. Vascular calcifications are noted. No obstructive changes are seen. Bladder is decompressed. Stomach/Bowel: The appendix is well visualized and within normal limits. Colon shows no obstructive or inflammatory changes. Small bowel and stomach appear within normal limits. Vascular/Lymphatic: Aortic atherosclerosis. No enlarged abdominal or pelvic lymph nodes. Reproductive: Uterus and bilateral adnexa are unremarkable. Other: No  abdominal wall hernia or abnormality. No abdominopelvic ascites. Musculoskeletal: Degenerative changes of lumbar spine are noted. IMPRESSION: Renal atrophy similar to that seen on the prior exam. No acute abnormality noted. Electronically Signed   By: Inez Catalina M.D.   On: 09/02/2020 15:47   DG Chest Portable 1 View  Result Date: 09/01/2020 CLINICAL DATA:  Elevated temperature during dialysis today. Weakness and fever. EXAM: PORTABLE CHEST 1 VIEW COMPARISON:  08/28/2020 and older exams. FINDINGS: Cardiac silhouette normal in size.  No mediastinal or hilar masses. Lungs clear. Opacity noted at the lung bases on the recent prior study is no longer visualized. Lung bases are somewhat suboptimally visualized due to the significant overlying soft tissue. Cannot exclude a small effusion. No pneumothorax. Right anterior chest wall internal  jugular tunneled central venous catheter is stable. IMPRESSION: 1. No acute cardiopulmonary disease. Electronically Signed   By: Lajean Manes M.D.   On: 09/01/2020 14:55    Medications: Infusions: . sodium chloride    . sodium chloride    . sodium chloride    . ceFEPime (MAXIPIME) IV 1 g (09/03/20 1050)  . [START ON 09/04/2020] ceFEPime (MAXIPIME) IV      Scheduled Medications: . aspirin EC  81 mg Oral Daily  . atorvastatin  10 mg Oral q1800  . carvedilol  12.5 mg Oral BID  . Chlorhexidine Gluconate Cloth  6 each Topical Q0600  . cinacalcet  30 mg Oral Q T,Th,Sa-HD  . doxercalciferol  5 mcg Intravenous Q T,Th,Sa-HD  . famotidine  20 mg Oral Daily  . gabapentin  100 mg Oral 2 times per day on Sun Mon Wed Fri   And  . gabapentin  100 mg Oral 3 times per day on Tue Thu Sat  . heparin  5,000 Units Subcutaneous Q8H  . heparin sodium (porcine)      . insulin aspart  0-5 Units Subcutaneous QHS  . insulin aspart  0-6 Units Subcutaneous TID WC  . insulin glargine  12 Units Subcutaneous QHS  . lidocaine-EPINEPHrine  20 mL Intradermal Once  . senna-docusate  2  tablet Oral QHS  . sodium chloride flush  3 mL Intravenous Q12H  . sodium chloride flush  3 mL Intravenous Q12H  . tiZANidine  4 mg Oral Q8H    have reviewed scheduled and prn medications.  Physical Exam: General:NAD, comfortable Heart:RRR, s1s2 nl Lungs:clear b/l, no crackle Abdomen:soft, Non-tender, non-distended Extremities: LE edema 1+ Dialysis Access: Right IJ TDC catheter site looks clean  Courtney Gray 09/03/2020,11:09 AM  LOS: 1 day

## 2020-09-03 NOTE — Procedures (Signed)
   HEMODIALYSIS TREATMENT NOTE:  Pt has multiple complaints - upset about not being included in discussions yesterday regarding IV access, needs to be kept informed of plan and changes to plan, concerned about UF goal for dialysis.  "I'm below my dry weight.  How can you pull fluid that's not there?"  We had a lengthy discussion about this.  She is hypertensive and has 2+ pitting pedal edema.  She repeatedly expressed concern about cramping / being unable to tolerate UF.  Agreed to "try for 2 liters; that's enough of a fluid challenge" but then later said "if you can work some magic to where I won't cramp go for it."  Halfway through session she realized goal was set for 4L and she became tearful and upset, claiming I never told her I was trying to pull 4L, said no one is communicating with her about anything, and declared she was "just joking" when she told me to "go for it [UF goal]" .  Of note, BP was steadily rising throughout treatment and she had no cramps. Still, she refuses UF goal of 4L.  2 liters had been removed at this point and UF was stopped for the remainder of the session.  Dr. Carolin Sicks is aware.  4 hour session completed via RIJ TDC which will be removed later today.  BP steadily rose throughout treatment.  Pt agreed to allow Labetalol '10mg'$  IVP although "My pressure is connected to my emotions."  Carvedilol 12.'5mg'$  po also given.  BP came down to 154/63 p62 but then increased after blood was returned.  Pt wants some time to relax prior to catheter removal.  No changes from pre-dialysis assessment.    [name], RN

## 2020-09-04 DIAGNOSIS — N186 End stage renal disease: Secondary | ICD-10-CM | POA: Diagnosis not present

## 2020-09-04 DIAGNOSIS — D638 Anemia in other chronic diseases classified elsewhere: Secondary | ICD-10-CM | POA: Diagnosis not present

## 2020-09-04 DIAGNOSIS — J9611 Chronic respiratory failure with hypoxia: Secondary | ICD-10-CM | POA: Diagnosis not present

## 2020-09-04 DIAGNOSIS — R509 Fever, unspecified: Secondary | ICD-10-CM | POA: Diagnosis not present

## 2020-09-04 LAB — CULTURE, BLOOD (ROUTINE X 2): Special Requests: ADEQUATE

## 2020-09-04 LAB — GLUCOSE, CAPILLARY
Glucose-Capillary: 151 mg/dL — ABNORMAL HIGH (ref 70–99)
Glucose-Capillary: 161 mg/dL — ABNORMAL HIGH (ref 70–99)
Glucose-Capillary: 192 mg/dL — ABNORMAL HIGH (ref 70–99)

## 2020-09-04 MED ORDER — LISINOPRIL 5 MG PO TABS
5.0000 mg | ORAL_TABLET | Freq: Every day | ORAL | Status: DC
  Administered 2020-09-04 – 2020-09-07 (×4): 5 mg via ORAL
  Filled 2020-09-04 (×4): qty 1

## 2020-09-04 MED ORDER — CARVEDILOL 12.5 MG PO TABS
25.0000 mg | ORAL_TABLET | Freq: Two times a day (BID) | ORAL | Status: DC
  Administered 2020-09-04 – 2020-09-07 (×5): 25 mg via ORAL
  Filled 2020-09-04 (×6): qty 2

## 2020-09-04 NOTE — Plan of Care (Signed)
  Problem: Education: Goal: Knowledge of General Education information will improve Description Including pain rating scale, medication(s)/side effects and non-pharmacologic comfort measures Outcome: Progressing   Problem: Health Behavior/Discharge Planning: Goal: Ability to manage health-related needs will improve Outcome: Progressing   

## 2020-09-04 NOTE — Progress Notes (Signed)
PROGRESS NOTE    Courtney Gray  [id number] DOB: Sep 24, 1970 DOA: 09/01/2020 PCP: Medicine, [facility] Internal   Chief Complaint  Patient presents with  . Weakness  . Fever    Brief admission narrative:  As per H&P written by Dr. Denton Brick 09/01/2020 Courtney Gray  is a 50 y.o. female with past medical history relevant for morbid obesity, HTN, chronic hypoxic respiratory failure on 2 to 3 L of oxygen via nasal cannula, ESRD on HD Tuesdays Thursdays and Saturdays with last HD session on 08/30/2020, diabetes and HTN who presents to the ED with recurrent fevers -Patient was apparently discharged from [facility] on 08/31/2020 after being admitted on 08/28/2020 for volume overload and hypertensive urgency with BiPAP and IV nitro and diarrhea back-to-back hemodialysis sessions -- As per patient on 08/31/2020 when she got home she had fever above 102 and chills took some Tylenol and felt better -Patient went to hemodialysis session on 09/01/2020 and was found to have a fever above 102 so she was sent for hemodialysis center to the ED for further evaluation -Patient endorses right upper quadrant tenderness with nausea but no emesis last BM 08/31/2020, no diarrhea, no chest pains no palpitations no shortness of breath -Patient denies productive cough -In the ED chest x-ray without acute findings and COVID negative -In ED WBC is 12.0 hemoglobin is 9.2 -Lipase is 35, sodium is 131 chloride is 96 glucose 170 creatinine 11.2 -CT abdomen pelvis ordered and pending -Blood cultures pending -Patient does not make urine --Patient had a new right IJ HD catheter placed on 08/28/2020 after previous HD catheter fell out on 08/25/2020  Assessment & Plan: 1-fever in the setting of gram-negative rods/Pseudomonas bacteremia -Patient with positive Pseudomonas bacteremia as per blood culture results. -Dialysis catheter removed on 09/03/2020 after last dialysis treatment. -Repeating blood cultures on 09/04/2020  done. -Continue IV cefepime for now. Maybe Tressie Ellis to be continue later on as an outpatient to complete therapy. -Cultures results demonstrating pansensitive Pseudomonas. -Continue as needed antipyretics -Continue supportive care and follow clinical response. -Orders for IR to replace dialysis catheter on 09-06-20 in place.  2-ESRD -Patient chronically on Tuesday-Thursday-Saturday dialysis schedule -Last hemodialysis as an inpatient on 09/03/2020; at this moment we are anticipating a line holiday while providing antibiotics for his Pseudomonas bacteremia and will have intention to replace dialysis catheter on 6-22 for her next dialysis treatment. -Nephrology service is on board and we will continue to follow further recommendations and dialysis orders while inpatient.  3-anemia of chronic kidney disease -IV iron and Epogen therapy as per nephrology service. -Hemoglobin stable and no signs of overt bleeding currently.  4-morbid obesity -Low calorie diet, portion control and increase physical activity has been discussed with patient -Body mass index is 60.39 kg/m.  5-hypertension -Continue to follow vital signs and adjust antihypertensive agents as needed -Heart healthy/renal diet encouraged. 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  7-hyperlipidemia -Continue Lipitor. -Low-fat diet encouraged.  8-chronic respiratory failure with hypoxia -Continue chronic oxygen supplementation and as needed bronchodilators. -Good saturation on chronic supplementation.   -Reports no shortness of breath.   DVT prophylaxis: Heparin Code Status: Full code Family Communication: No family at bedside. Disposition:   Status is: Inpatient  Dispo: The patient is from: Home              Anticipated d/c is to: Home  Patient currently no medically  stable for discharge; still spiking fever and now with blood cultures suggesting gram-negative rods bacteremia.   Difficult to place patient no     Consultants:   Nephrology  General surgery (consulted to assist with IV access and for removal of previous dialysis catheter).   Procedures:  See below for x-ray reports.   Antimicrobials:  Initially Rocephin and vancomycin Vancomycin discontinued 09/03/2020 Cefepime    Subjective: T-max 99.6 on 09/03/2020; no frank fever appreciated.  Patient reports no hardness or chills sensations.  Denies shortness of breath, chest pain, nausea, vomiting, abdominal pain and orthopnea currently.  Reports that amount of food provided in her tray was not sufficient.  Objective: Vitals:   09/03/20 1346 09/03/20 2129 09/04/20 0527 09/04/20 1500  BP: (!) 185/63 (!) 159/79 (!) 177/77 (!) 190/75  Pulse: 66 62 64 60  Resp: '18 20 19   '$ Temp: 99.6 F (37.6 C) 99.1 F (37.3 C) 98.2 F (36.8 C) 98.2 F (36.8 C)  TempSrc: Oral Oral Oral Oral  SpO2: 100% 100% 100% 100%  Weight:      Height:        Intake/Output Summary (Last 24 hours) at 09/04/2020 1552 Last data filed at 09/04/2020 1300 Gross per 24 hour  Intake 960 ml  Output --  Net 960 ml   Filed Weights   09/02/20 0524 09/03/20 0500 09/03/20 0700  Weight: (!) 144.8 kg (!) 164.6 kg (!) 164.6 kg    Examination: General exam: Alert, awake, oriented x 3; sinew to be afebrile; no chest pain, no nausea, no vomiting, no palpitations and is in no acute distress. Respiratory system: Good air movement bilaterally; no wheezing or crackles appreciated on exam.  No use of accessory muscles seen.  Good oxygen saturation on chronic supplementation appreciated. Cardiovascular system:RRR. No murmurs, rubs, gallops.  Unable to properly assess JVD with body habitus. Gastrointestinal system: Abdomen is obese, nondistended, soft and nontender. No organomegaly or masses felt. Normal bowel sounds heard. Central  nervous system: Alert and oriented. No focal neurological deficits. Extremities: No cyanosis or clubbing. Skin: No petechiae. Psychiatry: Judgement and insight appear normal. Mood & affect appropriate.    Data Reviewed: I have personally reviewed following labs and imaging studies  CBC: Recent Labs  Lab 08/30/20 0224 08/31/20 0714 09/01/20 1500 09/02/20 0617 09/03/20 0914  WBC 12.5* 10.9* 12.0* 14.9* 10.0  NEUTROABS  --   --  10.0*  --   --   HGB 9.3* 9.6* 9.2* 9.2* 8.9*  HCT 30.0* 30.7* 29.8* 30.2* 28.6*  MCV 100.7* 100.3* 103.5* 103.1* 102.1*  PLT 158 161 158 153 0000000    Basic Metabolic Panel: Recent Labs  Lab 08/28/20 2310 08/29/20 1948 08/30/20 0224 08/31/20 0714 09/01/20 1500 09/02/20 0617 09/03/20 0914  NA 137 133* 133* 132* 131* 132* 129*  K 3.9 3.4* 3.6 3.9 3.9 3.3* 3.5  CL 99 96* 95* 94* 96* 93* 91*  CO2 '23 25 27 28 24 23 25  '$ GLUCOSE 78 90 78 192* 179* 144* 239*  BUN 34* 40* 25* 27* 51* 30* 54*  CREATININE 8.43* 9.48* 7.63* 7.44* 11.23* 8.01* 11.37*  CALCIUM 8.8* 7.9* 8.2* 8.3* 8.0* 8.1* 7.8*  MG 2.3  --  2.1 2.1  --   --   --   PHOS 3.6 3.7 4.5 5.1*  --   --  5.9*    GFR: Estimated Creatinine Clearance: 9.3 mL/min (A) (by C-G formula based on SCr of 11.37 mg/dL (H)).  Liver Function  Tests: Recent Labs  Lab 08/29/20 1948 09/01/20 1500 09/02/20 0617 09/03/20 0914  AST  --  14* 25  --   ALT  --  17 23  --   ALKPHOS  --  49 52  --   BILITOT  --  0.7 0.9  --   PROT  --  7.4 7.5  --   ALBUMIN 3.3* 3.5 3.6 3.4*   CBG: Recent Labs  Lab 09/03/20 1152 09/03/20 1604 09/03/20 2236 09/04/20 0736 09/04/20 1117  GLUCAP 116* 132* 214* 151* 161*    Recent Results (from the past 240 hour(s))  Resp Panel by RT-PCR (Flu A&B, Covid) Nasopharyngeal Swab     Status: None   Collection Time: 08/28/20  3:54 PM   Specimen: Nasopharyngeal Swab; Nasopharyngeal(NP) swabs in vial transport medium  Result Value Ref Range Status   SARS Coronavirus 2 by RT PCR  NEGATIVE NEGATIVE Final    Comment: (NOTE) SARS-CoV-2 target nucleic acids are NOT DETECTED.  The SARS-CoV-2 RNA is generally detectable in upper respiratory specimens during the acute phase of infection. The lowest concentration of SARS-CoV-2 viral copies this assay can detect is 138 copies/mL. A negative result does not preclude SARS-Cov-2 infection and should not be used as the sole basis for treatment or other patient management decisions. A negative result may occur with  improper specimen collection/handling, submission of specimen other than nasopharyngeal swab, presence of viral mutation(s) within the areas targeted by this assay, and inadequate number of viral copies(<138 copies/mL). A negative result must be combined with clinical observations, patient history, and epidemiological information. The expected result is Negative.  Fact Sheet for Patients:  EntrepreneurPulse.com.au  Fact Sheet for Healthcare Providers:  IncredibleEmployment.be  This test is no t yet approved or cleared by the [place] FDA and  has been authorized for detection and/or diagnosis of SARS-CoV-2 by FDA under an Emergency Use Authorization (EUA). This EUA will remain  in effect (meaning this test can be used) for the duration of the COVID-19 declaration under Section 564(b)(1) of the Act, 21 U.S.C.section 360bbb-3(b)(1), unless the authorization is terminated  or revoked sooner.       Influenza A by PCR NEGATIVE NEGATIVE Final   Influenza B by PCR NEGATIVE NEGATIVE Final    Comment: (NOTE) The Xpert Xpress SARS-CoV-2/FLU/RSV plus assay is intended as an aid in the diagnosis of influenza from Nasopharyngeal swab specimens and should not be used as a sole basis for treatment. Nasal washings and aspirates are unacceptable for Xpert Xpress SARS-CoV-2/FLU/RSV testing.  Fact Sheet for Patients: EntrepreneurPulse.com.au  Fact Sheet for  Healthcare Providers: IncredibleEmployment.be  This test is not yet approved or cleared by the [place] FDA and has been authorized for detection and/or diagnosis of SARS-CoV-2 by FDA under an Emergency Use Authorization (EUA). This EUA will remain in effect (meaning this test can be used) for the duration of the COVID-19 declaration under Section 564(b)(1) of the Act, 21 U.S.C. section 360bbb-3(b)(1), unless the authorization is terminated or revoked.  Performed at [facility], [place] [address], [place], [place] [address]   MRSA PCR Screening     Status: None   Collection Time: 08/28/20 11:48 PM   Specimen: Nasopharyngeal  Result Value Ref Range Status   MRSA by PCR NEGATIVE NEGATIVE Final    Comment:        The GeneXpert MRSA Assay (FDA approved for NASAL specimens only), is one component of a comprehensive MRSA colonization surveillance program. It is not intended  to diagnose MRSA infection nor to guide or monitor treatment for MRSA infections. Performed at [facility], [place] [address] [place], [place] [address]   Resp Panel by RT-PCR (Flu A&B, Covid) Nasopharyngeal Swab     Status: None   Collection Time: 09/01/20  1:45 PM   Specimen: Nasopharyngeal Swab; Nasopharyngeal(NP) swabs in vial transport medium  Result Value Ref Range Status   SARS Coronavirus 2 by RT PCR NEGATIVE NEGATIVE Final    Comment: (NOTE) SARS-CoV-2 target nucleic acids are NOT DETECTED.  The SARS-CoV-2 RNA is generally detectable in upper respiratory specimens during the acute phase of infection. The lowest concentration of SARS-CoV-2 viral copies this assay can detect is 138 copies/mL. A negative result does not preclude SARS-Cov-2 infection and should not be used as the sole basis for treatment or other patient management decisions. A negative result may occur with  improper specimen collection/handling, submission of specimen other than nasopharyngeal swab,  presence of viral mutation(s) within the areas targeted by this assay, and inadequate number of viral copies(<138 copies/mL). A negative result must be combined with clinical observations, patient history, and epidemiological information. The expected result is Negative.  Fact Sheet for Patients:  EntrepreneurPulse.com.au  Fact Sheet for Healthcare Providers:  IncredibleEmployment.be  This test is no t yet approved or cleared by the [place] FDA and  has been authorized for detection and/or diagnosis of SARS-CoV-2 by FDA under an Emergency Use Authorization (EUA). This EUA will remain  in effect (meaning this test can be used) for the duration of the COVID-19 declaration under Section 564(b)(1) of the Act, 21 U.S.C.section 360bbb-3(b)(1), unless the authorization is terminated  or revoked sooner.       Influenza A by PCR NEGATIVE NEGATIVE Final   Influenza B by PCR NEGATIVE NEGATIVE Final    Comment: (NOTE) The Xpert Xpress SARS-CoV-2/FLU/RSV plus assay is intended as an aid in the diagnosis of influenza from Nasopharyngeal swab specimens and should not be used as a sole basis for treatment. Nasal washings and aspirates are unacceptable for Xpert Xpress SARS-CoV-2/FLU/RSV testing.  Fact Sheet for Patients: EntrepreneurPulse.com.au  Fact Sheet for Healthcare Providers: IncredibleEmployment.be  This test is not yet approved or cleared by the [place] FDA and has been authorized for detection and/or diagnosis of SARS-CoV-2 by FDA under an Emergency Use Authorization (EUA). This EUA will remain in effect (meaning this test can be used) for the duration of the COVID-19 declaration under Section 564(b)(1) of the Act, 21 U.S.C. section 360bbb-3(b)(1), unless the authorization is terminated or revoked.  Performed at [facility], [address], [place], [place] [address]   Culture, blood (routine x  2)     Status: Abnormal   Collection Time: 09/01/20  3:00 PM   Specimen: Right Antecubital; Blood  Result Value Ref Range Status   Specimen Description   Final    RIGHT ANTECUBITAL Performed at [facility], [address] [place], [place] [address]    Special Requests   Final    Blood Culture results may not be optimal due to an inadequate volume of blood received in culture bottles BOTTLES DRAWN AEROBIC AND ANAEROBIC Performed at [facility], [address] [place], [place] [address]    Culture  Setup Time   Final    GRAM NEGATIVE RODS Gram Stain Report Called to,Read Back By and Verified With: RACHEAL TATE,RN '@1154'$  09/02/2020 KAY AEROBIC BOTTLE ONLY CRITICAL RESULT CALLED TO, READ BACK BY AND VERIFIED WITH: CRYSTAL REYNOLDS RN '@1625'$   09/02/20 EB Performed at [place] [address] [place], [place] [address]    Culture PSEUDOMONAS AERUGINOSA (A)  Final   Report Status 09/04/2020 FINAL  Final   Organism ID, Bacteria PSEUDOMONAS AERUGINOSA  Final      Susceptibility   Pseudomonas aeruginosa - MIC*    CEFTAZIDIME <=1 SENSITIVE Sensitive     CIPROFLOXACIN <=0.25 SENSITIVE Sensitive     GENTAMICIN <=1 SENSITIVE Sensitive     IMIPENEM 1 SENSITIVE Sensitive     PIP/TAZO <=4 SENSITIVE Sensitive     CEFEPIME 1 SENSITIVE Sensitive     * PSEUDOMONAS AERUGINOSA  Blood Culture ID Panel (Reflexed)     Status: Abnormal   Collection Time: 09/01/20  3:00 PM  Result Value Ref Range Status   Enterococcus faecalis NOT DETECTED NOT DETECTED Final   Enterococcus Faecium NOT DETECTED NOT DETECTED Final   Listeria monocytogenes NOT DETECTED NOT DETECTED Final   Staphylococcus species NOT DETECTED NOT DETECTED Final   Staphylococcus aureus (BCID) NOT DETECTED NOT DETECTED Final   Staphylococcus epidermidis NOT DETECTED NOT DETECTED Final   Staphylococcus lugdunensis NOT DETECTED NOT DETECTED Final   Streptococcus species NOT DETECTED NOT DETECTED Final   Streptococcus agalactiae NOT  DETECTED NOT DETECTED Final   Streptococcus pneumoniae NOT DETECTED NOT DETECTED Final   Streptococcus pyogenes NOT DETECTED NOT DETECTED Final   A.calcoaceticus-baumannii NOT DETECTED NOT DETECTED Final   Bacteroides fragilis NOT DETECTED NOT DETECTED Final   Enterobacterales NOT DETECTED NOT DETECTED Final   Enterobacter cloacae complex NOT DETECTED NOT DETECTED Final   Escherichia coli NOT DETECTED NOT DETECTED Final   Klebsiella aerogenes NOT DETECTED NOT DETECTED Final   Klebsiella oxytoca NOT DETECTED NOT DETECTED Final   Klebsiella pneumoniae NOT DETECTED NOT DETECTED Final   Proteus species NOT DETECTED NOT DETECTED Final   Salmonella species NOT DETECTED NOT DETECTED Final   Serratia marcescens NOT DETECTED NOT DETECTED Final   Haemophilus influenzae NOT DETECTED NOT DETECTED Final   Neisseria meningitidis NOT DETECTED NOT DETECTED Final   Pseudomonas aeruginosa DETECTED (A) NOT DETECTED Final    Comment: CRITICAL RESULT CALLED TO, READ BACK BY AND VERIFIED WITH: CRYSTAL REYNOLDS RN '@1625'$  09/02/20 EB    Stenotrophomonas maltophilia NOT DETECTED NOT DETECTED Final   Candida albicans NOT DETECTED NOT DETECTED Final   Candida auris NOT DETECTED NOT DETECTED Final   Candida glabrata NOT DETECTED NOT DETECTED Final   Candida krusei NOT DETECTED NOT DETECTED Final   Candida parapsilosis NOT DETECTED NOT DETECTED Final   Candida tropicalis NOT DETECTED NOT DETECTED Final   Cryptococcus neoformans/gattii NOT DETECTED NOT DETECTED Final   CTX-M ESBL NOT DETECTED NOT DETECTED Final   Carbapenem resistance IMP NOT DETECTED NOT DETECTED Final   Carbapenem resistance KPC NOT DETECTED NOT DETECTED Final   Carbapenem resistance NDM NOT DETECTED NOT DETECTED Final   Carbapenem resistance VIM NOT DETECTED NOT DETECTED Final    Comment: Performed at [facility], [address] [place], [place] [address]  Culture, blood (routine x 2)     Status: Abnormal   Collection Time: 09/01/20   3:49 PM   Specimen: BLOOD LEFT HAND  Result Value Ref Range Status   Specimen Description   Final    BLOOD LEFT HAND Performed at [facility], [address], [place], [place] [address]    Special Requests   Final    Blood Culture adequate volume BOTTLES DRAWN AEROBIC AND ANAEROBIC Performed at [facility],  [address], [place], [place] [address]    Culture  Setup Time   Final    GRAM NEGATIVE RODS ANAEROBIC BOTTLE ONLY CRYSTAL REYNOLDS,RN '@1306'$  09/02/2020 KAY ANAEROBIC BOTTLE GRAM NEGATIVE RODS RESULT PREVIOUSLY CALLED Performed at [facility], [address] [place], [place] [address]    Culture (A)  Final    PSEUDOMONAS AERUGINOSA SUSCEPTIBILITIES PERFORMED ON PREVIOUS CULTURE WITHIN THE LAST 5 DAYS. Performed at [facility], [place] [address], [place], [place] [address]    Report Status 09/04/2020 FINAL  Final  Culture, blood (Routine X 2) w Reflex to ID Panel     Status: None (Preliminary result)   Collection Time: 09/03/20 11:54 PM   Specimen: BLOOD LEFT FOREARM  Result Value Ref Range Status   Specimen Description BLOOD LEFT FOREARM  Final   Special Requests   Final    BOTTLES DRAWN AEROBIC AND ANAEROBIC Blood Culture adequate volume   Culture   Final    NO GROWTH < 12 HOURS Performed at [facility], [address], [place], [place] [address]    Report Status PENDING  Incomplete  Culture, blood (Routine X 2) w Reflex to ID Panel     Status: None (Preliminary result)   Collection Time: 09/03/20 11:59 PM   Specimen: BLOOD LEFT HAND  Result Value Ref Range Status   Specimen Description BLOOD LEFT HAND  Final   Special Requests   Final    BOTTLES DRAWN AEROBIC AND ANAEROBIC Blood Culture adequate volume   Culture   Final    NO GROWTH < 12 HOURS Performed at [facility], [address], [place], [place] [address]    Report Status PENDING  Incomplete     Radiology Studies: DG Chest Port 1 View  Result Date: 09/03/2020 CLINICAL DATA:  Status post  dialysis catheter removal EXAM: PORTABLE CHEST 1 VIEW COMPARISON:  09/01/2020 FINDINGS: Right jugular dialysis catheter has been removed in the interval. Cardiac shadow remains prominent. Lungs are well aerated bilaterally. No focal infiltrate or effusion is seen. No acute bony abnormality is noted. IMPRESSION: No acute abnormality noted. Electronically Signed   By: Inez Catalina M.D.   On: 09/03/2020 15:39   Scheduled Meds: . amLODipine  10 mg Oral Daily  . aspirin EC  81 mg Oral Daily  . atorvastatin  10 mg Oral q1800  . carvedilol  12.5 mg Oral BID  . Chlorhexidine Gluconate Cloth  6 each Topical Q0600  . darbepoetin (ARANESP) injection - DIALYSIS  100 mcg Intravenous Once  . doxercalciferol  5 mcg Intravenous Q T,Th,Sa-HD  . famotidine  20 mg Oral Daily  . gabapentin  100 mg Oral 2 times per day on Sun Mon Wed Fri   And  . gabapentin  100 mg Oral 3 times per day on Tue Thu Sat  . heparin  5,000 Units Subcutaneous Q8H  . insulin aspart  0-5 Units Subcutaneous QHS  . insulin aspart  0-6 Units Subcutaneous TID WC  . insulin glargine  12 Units Subcutaneous QHS  . lidocaine-EPINEPHrine  20 mL Intradermal Once  . lisinopril  5 mg Oral Daily  . senna-docusate  2 tablet Oral QHS  . sodium chloride flush  3 mL Intravenous Q12H  . sodium chloride flush  3 mL Intravenous Q12H  . tiZANidine  4 mg Oral Q8H   Continuous Infusions: . sodium chloride    . sodium chloride    . sodium chloride    . ceFEPime (MAXIPIME) IV  LOS: 2 days    Time spent: 30 minutes  Barton Dubois, MD [facility]   To contact the attending provider between 7A-7P or the covering provider during after hours 7P-7A, please log into the web site [email or web address] and access using universal [place] password for that web site. If you do not have the password, please call the hospital operator.  09/04/2020, 3:52 PM

## 2020-09-04 NOTE — Progress Notes (Signed)
Taking over patient care for night shift. Informed by dayshift RN that patient had multiple complaints today and had spoken with management. Went into patient's room and received bedside report. Patient had nothing to add at that time.

## 2020-09-04 NOTE — Progress Notes (Signed)
Assumed care of patient at 0700. I was informed by the night RN caring for patient that patient had multiple complaints and concerns and had requested the night charge nurse to come speak with her. Informed by night RN that charge nurse had attempted to speak with her twice but patient was busy both times. Day charge nurse, Mardene Celeste, went and spoke with her to address her concerns. Later in the afternoon, Angie Sophronia Simas was rounding on patients on the floor and I requested that she round on this patient. Ace Gins, the Therapist, sports, rounded on patient this afternoon around 4pm. Estill Bamberg gave patient her card. Later I went into patient's room and asked her if Angie and Estill Bamberg had addressed her concerns. She said she was satisfied with the conversation she had with them. While I was in the room patient received a phone call. I told her I would come back and check on her. About 20 minutes later I went back into her room and she asked me for an admission packet because she did not have one. I went into her room again to give her the packet and she was then on the phone with her daughter. She had me find the page and circle the phone number for patient experience. She stated that she wanted to talk with Estill Bamberg or Angie again or someone above them. I told her the best thing to do at this point would be for her to call [facility]. She called Estill Bamberg and left her a Advertising account executive. I told patient that I would pass on this information to the night charge nurse which I did. I explained the situation to [employer]  Bedside report given to Augustin Coupe. Patient asked if she had any questions about the report or if she wanted to add any information. She said that she had nothing to add at the moment.

## 2020-09-04 NOTE — Progress Notes (Addendum)
[facility] NEPHROLOGY PROGRESS NOTE  Assessment/ Plan: Pt is a 50 y.o. yo female with morbid obesity, DM, HTN, ESRD on HD TTS at [facility] was admitted with fever, seen as a consultation for ESRD management.  Outpatient HD: [name], TTS, EDW 167.5 kg, 1K/2.5 Ca,Heparin 2600 units bolus then 600 unit hourly,4 hours,BF 400,DF 500,RIJ catheter Hectoral 46mg IV/HD Epogen 7000Units IV/HD  Othercinacalcet 30 mg every HD  #Acute febrile illness due to Pseudomonas bacteremia related with dialysis catheter.  Currently on IV cefepime with dialysis.  The HD catheter was removed on 5/30 after dialysis.  The culture was repeated.  If the culture is negative in 48 hours then I think it is safe to place another HD catheter.  Dr. BConstance Hawis not able to place HD catheter on Wednesday or Thursday therefore patient might need to go to [facility].  I have discussed this with the patient and the primary team.  May consider ID consult/curbside if they are not available at this hospital.  # ESRD TTS: Received HD on 5/30 off schedule because of need need to remove HD catheter.  She is clinically stable.  Plan for next HD on Thursday.    # Anemia: Monitor hemoglobin.  Continue ESA.  No iron because of bacteremia.  # Secondary hyperparathyroidism: Monitor calcium and phosphorus level.  Given mild hypocalcemia, I will hold Sensipar.  # HTN/volume: BP remains elevated.  Continue amlodipine and carvedilol.  Resumed lisinopril (home medication).  May be able to uptitrate the dose.  #Hyponatremia, hypervolemic: Managed with dialysis.  Fluid restriction.  Monitor lab.  Discussed with the primary team.  Subjective: The HD catheter was removed yesterday.  Patient was sitting on chair comfortable.  She reports frustration because of her ongoing medical condition, hospitalization etc.  Denies chest pain, shortness of breath, nausea, vomiting, headache or dizziness. Objective Vital signs in last 24  hours: Vitals:   09/03/20 1156 09/03/20 1346 09/03/20 2129 09/04/20 0527  BP:  (!) 185/63 (!) 159/79 (!) 177/77  Pulse:  66 62 64  Resp: '16 18 20 19  '$ Temp: 98.8 F (37.1 C) 99.6 F (37.6 C) 99.1 F (37.3 C) 98.2 F (36.8 C)  TempSrc: Oral Oral Oral Oral  SpO2:  100% 100% 100%  Weight:      Height:       Weight change:   Intake/Output Summary (Last 24 hours) at 09/04/2020 0945 Last data filed at 09/03/2020 1120 Gross per 24 hour  Intake --  Output 1923 ml  Net -1923 ml       Labs: Basic Metabolic Panel: Recent Labs  Lab 08/30/20 0224 08/31/20 0714 09/01/20 1500 09/02/20 0617 09/03/20 0914  NA 133* 132* 131* 132* 129*  K 3.6 3.9 3.9 3.3* 3.5  CL 95* 94* 96* 93* 91*  CO2 '27 28 24 23 25  '$ GLUCOSE 78 192* 179* 144* 239*  BUN 25* 27* 51* 30* 54*  CREATININE 7.63* 7.44* 11.23* 8.01* 11.37*  CALCIUM 8.2* 8.3* 8.0* 8.1* 7.8*  PHOS 4.5 5.1*  --   --  5.9*   Liver Function Tests: Recent Labs  Lab 08/28/20 1538 08/29/20 1948 09/01/20 1500 09/02/20 0617 09/03/20 0914  AST 19  --  14* 25  --   ALT 20  --  17 23  --   ALKPHOS 58  --  49 52  --   BILITOT 0.9  --  0.7 0.9  --   PROT 7.5  --  7.4 7.5  --  ALBUMIN 3.9   < > 3.5 3.6 3.4*   < > = values in this interval not displayed.   Recent Labs  Lab 09/01/20 1500  LIPASE 35   No results for input(s): AMMONIA in the last 168 hours. CBC: Recent Labs  Lab 08/28/20 1538 08/28/20 1604 08/30/20 0224 08/31/20 0714 09/01/20 1500 09/02/20 0617 09/03/20 0914  WBC 13.0*   < > 12.5* 10.9* 12.0* 14.9* 10.0  NEUTROABS 10.4*  --   --   --  10.0*  --   --   HGB 10.9*   < > 9.3* 9.6* 9.2* 9.2* 8.9*  HCT 36.0   < > 30.0* 30.7* 29.8* 30.2* 28.6*  MCV 103.2*   < > 100.7* 100.3* 103.5* 103.1* 102.1*  PLT 194   < > 158 161 158 153 158   < > = values in this interval not displayed.   Cardiac Enzymes: No results for input(s): CKTOTAL, CKMB, CKMBINDEX, TROPONINI in the last 168 hours. CBG: Recent Labs  Lab  09/02/20 2129 09/03/20 1152 09/03/20 1604 09/03/20 2236 09/04/20 0736  GLUCAP 211* 116* 132* 214* 151*    Iron Studies: No results for input(s): IRON, TIBC, TRANSFERRIN, FERRITIN in the last 72 hours. Studies/Results: CT ABDOMEN PELVIS WO CONTRAST  Result Date: 09/02/2020 CLINICAL DATA:  Abdominal pain and fever EXAM: CT ABDOMEN AND PELVIS WITHOUT CONTRAST TECHNIQUE: Multidetector CT imaging of the abdomen and pelvis was performed following the standard protocol without IV contrast. COMPARISON:  04/08/2020 FINDINGS: Lower chest: Mild scarring is noted in the lung bases bilaterally. No sizable effusion is seen. Hepatobiliary: No focal liver abnormality is seen. No gallstones, gallbladder wall thickening, or biliary dilatation. Pancreas: Unremarkable. No pancreatic ductal dilatation or surrounding inflammatory changes. Spleen: Normal in size without focal abnormality. Adrenals/Urinary Tract: Adrenal glands are within normal limits. Kidneys demonstrate mild atrophy bilaterally. Perinephric stranding is noted similar to that seen on prior exams. Vascular calcifications are noted. No obstructive changes are seen. Bladder is decompressed. Stomach/Bowel: The appendix is well visualized and within normal limits. Colon shows no obstructive or inflammatory changes. Small bowel and stomach appear within normal limits. Vascular/Lymphatic: Aortic atherosclerosis. No enlarged abdominal or pelvic lymph nodes. Reproductive: Uterus and bilateral adnexa are unremarkable. Other: No abdominal wall hernia or abnormality. No abdominopelvic ascites. Musculoskeletal: Degenerative changes of lumbar spine are noted. IMPRESSION: Renal atrophy similar to that seen on the prior exam. No acute abnormality noted. Electronically Signed   By: Inez Catalina M.D.   On: 09/02/2020 15:47   DG Chest Port 1 View  Result Date: 09/03/2020 CLINICAL DATA:  Status post dialysis catheter removal EXAM: PORTABLE CHEST 1 VIEW COMPARISON:   09/01/2020 FINDINGS: Right jugular dialysis catheter has been removed in the interval. Cardiac shadow remains prominent. Lungs are well aerated bilaterally. No focal infiltrate or effusion is seen. No acute bony abnormality is noted. IMPRESSION: No acute abnormality noted. Electronically Signed   By: Inez Catalina M.D.   On: 09/03/2020 15:39    Medications: Infusions: . sodium chloride    . sodium chloride    . sodium chloride    . ceFEPime (MAXIPIME) IV      Scheduled Medications: . amLODipine  10 mg Oral Daily  . aspirin EC  81 mg Oral Daily  . atorvastatin  10 mg Oral q1800  . carvedilol  12.5 mg Oral BID  . Chlorhexidine Gluconate Cloth  6 each Topical Q0600  . darbepoetin (ARANESP) injection - DIALYSIS  100 mcg Intravenous Once  . doxercalciferol  5 mcg Intravenous Q T,Th,Sa-HD  . famotidine  20 mg Oral Daily  . gabapentin  100 mg Oral 2 times per day on Sun Mon Wed Fri   And  . gabapentin  100 mg Oral 3 times per day on Tue Thu Sat  . heparin  5,000 Units Subcutaneous Q8H  . insulin aspart  0-5 Units Subcutaneous QHS  . insulin aspart  0-6 Units Subcutaneous TID WC  . insulin glargine  12 Units Subcutaneous QHS  . lidocaine-EPINEPHrine  20 mL Intradermal Once  . senna-docusate  2 tablet Oral QHS  . sodium chloride flush  3 mL Intravenous Q12H  . sodium chloride flush  3 mL Intravenous Q12H  . tiZANidine  4 mg Oral Q8H    have reviewed scheduled and prn medications.  Physical Exam: General:NAD, comfortable Heart:RRR, s1s2 nl Lungs: Clear b/l, no crackle Abdomen:soft, Non-tender, non-distended Extremities: Trace LE edema  Dialysis Access: Right IJ TDC catheter was removed.  Lani Mendiola Prasad Jorje Vanatta 09/04/2020,9:45 AM  LOS: 2 days

## 2020-09-04 NOTE — Progress Notes (Signed)
Performed shift physical assessment with patient. No complaints at this time. Patient voiced concern of being in room and encouraged her to walk in hallway. Retrieved portable O2 tank and readied patient. Patient used rolling walker and walked approximately 150 feet. Patient stopped once to rest and continued on back to room. Tolerated well. Patient hooked back up to wall O2. Petroleum jelly applied to feet and new socks applied. Left in good condition. Told her I would be back at 10 pm with meds

## 2020-09-04 NOTE — Progress Notes (Signed)
Patient ambulated in hallway with staff. Patient in no distress at this time.

## 2020-09-05 DIAGNOSIS — J9611 Chronic respiratory failure with hypoxia: Secondary | ICD-10-CM | POA: Diagnosis not present

## 2020-09-05 DIAGNOSIS — R7881 Bacteremia: Secondary | ICD-10-CM

## 2020-09-05 DIAGNOSIS — N186 End stage renal disease: Secondary | ICD-10-CM | POA: Diagnosis not present

## 2020-09-05 DIAGNOSIS — I10 Essential (primary) hypertension: Secondary | ICD-10-CM | POA: Diagnosis not present

## 2020-09-05 DIAGNOSIS — Z794 Long term (current) use of insulin: Secondary | ICD-10-CM

## 2020-09-05 DIAGNOSIS — E1169 Type 2 diabetes mellitus with other specified complication: Secondary | ICD-10-CM

## 2020-09-05 LAB — GLUCOSE, CAPILLARY
Glucose-Capillary: 177 mg/dL — ABNORMAL HIGH (ref 70–99)
Glucose-Capillary: 181 mg/dL — ABNORMAL HIGH (ref 70–99)
Glucose-Capillary: 173 mg/dL — ABNORMAL HIGH (ref 70–99)
Glucose-Capillary: 175 mg/dL — ABNORMAL HIGH (ref 70–99)
Glucose-Capillary: 217 mg/dL — ABNORMAL HIGH (ref 70–99)

## 2020-09-05 MED ORDER — VANCOMYCIN HCL 1500 MG/300ML IV SOLN: 1500.0000 mg | INTRAVENOUS | Status: AC

## 2020-09-05 MED ORDER — CHLORHEXIDINE GLUCONATE CLOTH 2 % EX PADS: 6.0000 | MEDICATED_PAD | Freq: Every day | CUTANEOUS | Status: DC

## 2020-09-05 NOTE — Progress Notes (Signed)
[facility] ROUNDING NOTE   Subjective:   Brief history: 50 year old lady I diabetes hypertension stage renal disease Tuesday Thursday Saturday dialysis.  Admitted with fever cultures positive for Pseudomonas bacteremia probably related to catheter related infection.  HD catheter was removed 09/03/2020.  Interventional radiology to place catheter 09/06/2020.  Blood pressure 179/90 pulse 60 temperature 98 O2 sats 98% nasal cannula  Labs pending 09/05/2020   last potassium 3.5 CO2 25 sodium 139 creatinine 11.36 calcium 7.8 albumin 3.4 phosphorus 5.9 hemoglobin 8.9  Outpatient HD: [name], TTS, EDW 167.5 kg, 1K/2.5 Ca,Heparin 2600 units bolus then 600 unit hourly,4 hours,BF 400,DF 500,RIJ catheter Hectoral 42mg IV/HD Epogen 7000Units IV/HD  Othercinacalcet 30 mg every HD   Objective:  Vital signs in last 24 hours:  Temp:  [98.1 F (36.7 C)-98.2 F (36.8 C)] 98.2 F (36.8 C) (06/01 0516) Pulse Rate:  [60-63] 63 (06/01 0516) Resp:  [18-19] 18 (06/01 0516) BP: (146-190)/(70-90) 179/90 (06/01 0516) SpO2:  [98 %-100 %] 98 % (06/01 0516) Weight:  [163.7 kg] 163.7 kg (06/01 0616)  Weight change:  Filed Weights   09/03/20 0500 09/03/20 0700 09/05/20 0616  Weight: (!) 164.6 kg (!) 164.6 kg (!) 163.7 kg    Intake/Output: I/O last 3 completed shifts: In: 960 [P.O.:960] Out: 1923 [[id number]  Intake/Output this shift:  No intake/output data recorded.  CVS- RRR RS- CTA ABD- BS present soft non-distended EXT- no edema   Basic Metabolic Panel: Recent Labs  Lab 08/29/20 1948 08/30/20 0224 08/31/20 0714 09/01/20 1500 09/02/20 0617 09/03/20 0914  NA 133* 133* 132* 131* 132* 129*  K 3.4* 3.6 3.9 3.9 3.3* 3.5  CL 96* 95* 94* 96* 93* 91*  CO2 '25 27 28 24 23 25  '$ GLUCOSE 90 78 192* 179* 144* 239*  BUN 40* 25* 27* 51* 30* 54*  CREATININE 9.48* 7.63* 7.44* 11.23* 8.01* 11.37*  CALCIUM 7.9* 8.2* 8.3* 8.0* 8.1* 7.8*  MG  --  2.1 2.1  --   --   --   PHOS 3.7  4.5 5.1*  --   --  5.9*    Liver Function Tests: Recent Labs  Lab 08/29/20 1948 09/01/20 1500 09/02/20 0617 09/03/20 0914  AST  --  14* 25  --   ALT  --  17 23  --   ALKPHOS  --  49 52  --   BILITOT  --  0.7 0.9  --   PROT  --  7.4 7.5  --   ALBUMIN 3.3* 3.5 3.6 3.4*   Recent Labs  Lab 09/01/20 1500  LIPASE 35   No results for input(s): AMMONIA in the last 168 hours.  CBC: Recent Labs  Lab 08/30/20 0224 08/31/20 0714 09/01/20 1500 09/02/20 0617 09/03/20 0914  WBC 12.5* 10.9* 12.0* 14.9* 10.0  NEUTROABS  --   --  10.0*  --   --   HGB 9.3* 9.6* 9.2* 9.2* 8.9*  HCT 30.0* 30.7* 29.8* 30.2* 28.6*  MCV 100.7* 100.3* 103.5* 103.1* 102.1*  PLT 158 161 158 153 158    Cardiac Enzymes: No results for input(s): CKTOTAL, CKMB, CKMBINDEX, TROPONINI in the last 168 hours.  BNP: Invalid input(s): POCBNP  CBG: Recent Labs  Lab 09/03/20 1604 09/03/20 2236 09/04/20 0736 09/04/20 1117 09/04/20 1657  GLUCAP 132* 214* 151* 161* 192*    Microbiology: Results for orders placed or performed during the hospital encounter of 09/01/20  Resp Panel by RT-PCR (Flu A&B, Covid) Nasopharyngeal Swab     Status:  None   Collection Time: 09/01/20  1:45 PM   Specimen: Nasopharyngeal Swab; Nasopharyngeal(NP) swabs in vial transport medium  Result Value Ref Range Status   SARS Coronavirus 2 by RT PCR NEGATIVE NEGATIVE Final    Comment: (NOTE) SARS-CoV-2 target nucleic acids are NOT DETECTED.  The SARS-CoV-2 RNA is generally detectable in upper respiratory specimens during the acute phase of infection. The lowest concentration of SARS-CoV-2 viral copies this assay can detect is 138 copies/mL. A negative result does not preclude SARS-Cov-2 infection and should not be used as the sole basis for treatment or other patient management decisions. A negative result may occur with  improper specimen collection/handling, submission of specimen other than nasopharyngeal swab, presence of viral  mutation(s) within the areas targeted by this assay, and inadequate number of viral copies(<138 copies/mL). A negative result must be combined with clinical observations, patient history, and epidemiological information. The expected result is Negative.  Fact Sheet for Patients:  EntrepreneurPulse.com.au  Fact Sheet for Healthcare Providers:  IncredibleEmployment.be  This test is no t yet approved or cleared by the [place] FDA and  has been authorized for detection and/or diagnosis of SARS-CoV-2 by FDA under an Emergency Use Authorization (EUA). This EUA will remain  in effect (meaning this test can be used) for the duration of the COVID-19 declaration under Section 564(b)(1) of the Act, 21 U.S.C.section 360bbb-3(b)(1), unless the authorization is terminated  or revoked sooner.       Influenza A by PCR NEGATIVE NEGATIVE Final   Influenza B by PCR NEGATIVE NEGATIVE Final    Comment: (NOTE) The Xpert Xpress SARS-CoV-2/FLU/RSV plus assay is intended as an aid in the diagnosis of influenza from Nasopharyngeal swab specimens and should not be used as a sole basis for treatment. Nasal washings and aspirates are unacceptable for Xpert Xpress SARS-CoV-2/FLU/RSV testing.  Fact Sheet for Patients: EntrepreneurPulse.com.au  Fact Sheet for Healthcare Providers: IncredibleEmployment.be  This test is not yet approved or cleared by the [place] FDA and has been authorized for detection and/or diagnosis of SARS-CoV-2 by FDA under an Emergency Use Authorization (EUA). This EUA will remain in effect (meaning this test can be used) for the duration of the COVID-19 declaration under Section 564(b)(1) of the Act, 21 U.S.C. section 360bbb-3(b)(1), unless the authorization is terminated or revoked.  Performed at [facility], [address], [place], [place] [address]   Culture, blood (routine x 2)     Status:  Abnormal   Collection Time: 09/01/20  3:00 PM   Specimen: Right Antecubital; Blood  Result Value Ref Range Status   Specimen Description   Final    RIGHT ANTECUBITAL Performed at [facility], [address], [place], [place] [address]    Special Requests   Final    Blood Culture results may not be optimal due to an inadequate volume of blood received in culture bottles BOTTLES DRAWN AEROBIC AND ANAEROBIC Performed at [facility], [address], [place], [place] [address]    Culture  Setup Time   Final    GRAM NEGATIVE RODS Gram Stain Report Called to,Read Back By and Verified With: RACHEAL TATE,RN '@1154'$  09/02/2020 KAY AEROBIC BOTTLE ONLY CRITICAL RESULT CALLED TO, READ BACK BY AND VERIFIED WITH: CRYSTAL REYNOLDS RN '@1625'$  09/02/20 EB Performed at [facility] [address], [place], [place] [address]    Culture PSEUDOMONAS AERUGINOSA (A)  Final   Report Status 09/04/2020 FINAL  Final   Organism ID, Bacteria PSEUDOMONAS AERUGINOSA  Final  Susceptibility   Pseudomonas aeruginosa - MIC*    CEFTAZIDIME <=1 SENSITIVE Sensitive     CIPROFLOXACIN <=0.25 SENSITIVE Sensitive     GENTAMICIN <=1 SENSITIVE Sensitive     IMIPENEM 1 SENSITIVE Sensitive     PIP/TAZO <=4 SENSITIVE Sensitive     CEFEPIME 1 SENSITIVE Sensitive     * PSEUDOMONAS AERUGINOSA  Blood Culture ID Panel (Reflexed)     Status: Abnormal   Collection Time: 09/01/20  3:00 PM  Result Value Ref Range Status   Enterococcus faecalis NOT DETECTED NOT DETECTED Final   Enterococcus Faecium NOT DETECTED NOT DETECTED Final   Listeria monocytogenes NOT DETECTED NOT DETECTED Final   Staphylococcus species NOT DETECTED NOT DETECTED Final   Staphylococcus aureus (BCID) NOT DETECTED NOT DETECTED Final   Staphylococcus epidermidis NOT DETECTED NOT DETECTED Final   Staphylococcus lugdunensis NOT DETECTED NOT DETECTED Final   Streptococcus species NOT DETECTED NOT DETECTED Final   Streptococcus agalactiae NOT DETECTED NOT  DETECTED Final   Streptococcus pneumoniae NOT DETECTED NOT DETECTED Final   Streptococcus pyogenes NOT DETECTED NOT DETECTED Final   A.calcoaceticus-baumannii NOT DETECTED NOT DETECTED Final   Bacteroides fragilis NOT DETECTED NOT DETECTED Final   Enterobacterales NOT DETECTED NOT DETECTED Final   Enterobacter cloacae complex NOT DETECTED NOT DETECTED Final   Escherichia coli NOT DETECTED NOT DETECTED Final   Klebsiella aerogenes NOT DETECTED NOT DETECTED Final   Klebsiella oxytoca NOT DETECTED NOT DETECTED Final   Klebsiella pneumoniae NOT DETECTED NOT DETECTED Final   Proteus species NOT DETECTED NOT DETECTED Final   Salmonella species NOT DETECTED NOT DETECTED Final   Serratia marcescens NOT DETECTED NOT DETECTED Final   Haemophilus influenzae NOT DETECTED NOT DETECTED Final   Neisseria meningitidis NOT DETECTED NOT DETECTED Final   Pseudomonas aeruginosa DETECTED (A) NOT DETECTED Final    Comment: CRITICAL RESULT CALLED TO, READ BACK BY AND VERIFIED WITH: CRYSTAL REYNOLDS RN '@1625'$  09/02/20 EB    Stenotrophomonas maltophilia NOT DETECTED NOT DETECTED Final   Candida albicans NOT DETECTED NOT DETECTED Final   Candida auris NOT DETECTED NOT DETECTED Final   Candida glabrata NOT DETECTED NOT DETECTED Final   Candida krusei NOT DETECTED NOT DETECTED Final   Candida parapsilosis NOT DETECTED NOT DETECTED Final   Candida tropicalis NOT DETECTED NOT DETECTED Final   Cryptococcus neoformans/gattii NOT DETECTED NOT DETECTED Final   CTX-M ESBL NOT DETECTED NOT DETECTED Final   Carbapenem resistance IMP NOT DETECTED NOT DETECTED Final   Carbapenem resistance KPC NOT DETECTED NOT DETECTED Final   Carbapenem resistance NDM NOT DETECTED NOT DETECTED Final   Carbapenem resistance VIM NOT DETECTED NOT DETECTED Final    Comment: Performed at [facility], [address] [place], [place] [address]  Culture, blood (routine x 2)     Status: Abnormal   Collection Time: 09/01/20  3:49 PM    Specimen: BLOOD LEFT HAND  Result Value Ref Range Status   Specimen Description   Final    BLOOD LEFT HAND Performed at [facility], [address], [place], [place] [address]    Special Requests   Final    Blood Culture adequate volume BOTTLES DRAWN AEROBIC AND ANAEROBIC Performed at [facility], [address], [place], [place] [address]    Culture  Setup Time   Final    GRAM NEGATIVE RODS ANAEROBIC BOTTLE ONLY CRYSTAL REYNOLDS,RN '@1306'$  09/02/2020 KAY ANAEROBIC BOTTLE GRAM NEGATIVE RODS RESULT PREVIOUSLY CALLED Performed at [facility], [address]  [place], [place] [address]    Culture (A)  Final    PSEUDOMONAS AERUGINOSA SUSCEPTIBILITIES PERFORMED ON PREVIOUS CULTURE WITHIN THE LAST 5 DAYS. Performed at [facility], [place] [address], [place], [place] [address]    Report Status 09/04/2020 FINAL  Final  Culture, blood (Routine X 2) w Reflex to ID Panel     Status: None (Preliminary result)   Collection Time: 09/03/20 11:54 PM   Specimen: BLOOD LEFT FOREARM  Result Value Ref Range Status   Specimen Description BLOOD LEFT FOREARM  Final   Special Requests   Final    BOTTLES DRAWN AEROBIC AND ANAEROBIC Blood Culture adequate volume   Culture   Final    NO GROWTH < 12 HOURS Performed at [facility], [address] [place], [place] [address]    Report Status PENDING  Incomplete  Culture, blood (Routine X 2) w Reflex to ID Panel     Status: None (Preliminary result)   Collection Time: 09/03/20 11:59 PM   Specimen: BLOOD LEFT HAND  Result Value Ref Range Status   Specimen Description BLOOD LEFT HAND  Final   Special Requests   Final    BOTTLES DRAWN AEROBIC AND ANAEROBIC Blood Culture adequate volume   Culture   Final    NO GROWTH < 12 HOURS Performed at [facility], [address] [place],  [address]    Report Status PENDING  Incomplete    Coagulation Studies: No results for input(s): LABPROT, INR in the last 72 hours.  Urinalysis: No results for  input(s): COLORURINE, LABSPEC, PHURINE, GLUCOSEU, HGBUR, BILIRUBINUR, KETONESUR, PROTEINUR, UROBILINOGEN, NITRITE, LEUKOCYTESUR in the last 72 hours.  Invalid input(s): APPERANCEUR    Imaging: DG Chest Port 1 View  Result Date: 09/03/2020 CLINICAL DATA:  Status post dialysis catheter removal EXAM: PORTABLE CHEST 1 VIEW COMPARISON:  09/01/2020 FINDINGS: Right jugular dialysis catheter has been removed in the interval. Cardiac shadow remains prominent. Lungs are well aerated bilaterally. No focal infiltrate or effusion is seen. No acute bony abnormality is noted. IMPRESSION: No acute abnormality noted. Electronically Signed   By: Inez Catalina M.D.   On: 09/03/2020 15:39     Medications:   . sodium chloride    . sodium chloride    . sodium chloride    . ceFEPime (MAXIPIME) IV     . amLODipine  10 mg Oral Daily  . aspirin EC  81 mg Oral Daily  . atorvastatin  10 mg Oral q1800  . carvedilol  25 mg Oral BID  . Chlorhexidine Gluconate Cloth  6 each Topical Q0600  . darbepoetin (ARANESP) injection - DIALYSIS  100 mcg Intravenous Once  . doxercalciferol  5 mcg Intravenous Q T,Th,Sa-HD  . famotidine  20 mg Oral Daily  . gabapentin  100 mg Oral 2 times per day on Sun Mon Wed Fri   And  . gabapentin  100 mg Oral 3 times per day on Tue Thu Sat  . heparin  5,000 Units Subcutaneous Q8H  . insulin aspart  0-5 Units Subcutaneous QHS  . insulin aspart  0-6 Units Subcutaneous TID WC  . insulin glargine  12 Units Subcutaneous QHS  . lidocaine-EPINEPHrine  20 mL Intradermal Once  . lisinopril  5 mg Oral Daily  . senna-docusate  2 tablet Oral QHS  . sodium chloride flush  3 mL Intravenous Q12H  . sodium chloride flush  3 mL Intravenous Q12H  . tiZANidine  4 mg Oral Q8H   sodium chloride, sodium  chloride, sodium chloride, acetaminophen **OR** acetaminophen, alteplase, bisacodyl, diphenhydrAMINE, heparin, labetalol, lidocaine-prilocaine, nitroGLYCERIN, ondansetron **OR** ondansetron (ZOFRAN) IV,  polyethylene glycol, sodium chloride flush, traZODone  Assessment/ Plan:  #Acute febrile illness due to Pseudomonas bacteremia related with dialysis catheter.  Currently on IV cefepime with dialysis.  The HD catheter was removed on 5/30 after dialysis.  The culture was repeated.  If the culture is negative in 48 hours then I think it is safe to place another HD catheter.  Patient to go to interventional radiology [facility] 09/06/2020  # ESRD TTS: Received HD on 5/30 off schedule because of need need to remove HD catheter.  She is clinically stable.  Plan for next HD on 09/06/2020  # Anemia: Monitor hemoglobin.  Continue ESA.  No iron because of bacteremia.  # Secondary hyperparathyroidism: Monitor calcium and phosphorus level.    Sensipar on hold at this point  # HTN/volume: BP remains elevated.  Continue amlodipine and carvedilol.  Resumed lisinopril (home medication).  May be able to uptitrate the dose.  #Hyponatremia, hypervolemic: Managed with dialysis.  Fluid restriction.  Monitor lab.   LOS: [place] '@TODAY''@6'$ :44 AM

## 2020-09-05 NOTE — Progress Notes (Signed)
Entering patient's room again this am to give insulin and address needs at this time. Breakfast tray and coffee were warmed for patient, also she requested grits instead of oatmeal that was resolved as well.Cleaned off her table and filled water pitcher with ice and water.No other needs noted at this time.

## 2020-09-05 NOTE — Progress Notes (Signed)
Patient rounded on this morning, Knocked before entering the room and introduced myself, asked if their was anything she needed at this time report was also being given by night shift nurse..Will continue to monitor and assess needs.

## 2020-09-05 NOTE — Progress Notes (Signed)
Medication pass this am with patient, I went over each medication with the patient. Patient verbalized concerns about receiving baked chips that she wasn't suppose to have, patient is very concerned. I reached out to Dietitian Colman Cater to help with educating the patient regarding her diet. Before leaving the room, I asked if patient needed anything else. Patient requested clean towels and wash cloths, patient provided with both.Will continue to monitor.

## 2020-09-05 NOTE — Progress Notes (Signed)
IR was requested for a image guided hemodialysis catheter placement on 6/2.   Discussed IR schedule with [facility] IR charge RN, 12 pm on 09/06/20 is available.  Informed RN Larene Beach from [facility], patient to be transfered via [facility] to [facility] IR by 11~11:30 am on 6/2.  Patient to be transferred back to [facility] once procedure is done.   The procedure is tentatively scheduled at 12pm on 09/06/20.  Npo at midnight.  Vancomycin ordered.  Repeat blood cx on 5/30 showed no growth in 1 days.  CBC with no leukocytosis, VSS  Formal consult to follow once patient transfered to [facility] IR.    Armando Gang Aireanna Luellen PA-C 09/05/2020 10:26 AM

## 2020-09-05 NOTE — Progress Notes (Signed)
Attempted to round on patient, staff stated that she was asleep, requested that I not distrub her.

## 2020-09-05 NOTE — Progress Notes (Signed)
PROGRESS NOTE  Courtney Gray [id number] DOB: 1971/04/01 DOA: 09/01/2020 PCP: Medicine, [facility]  Brief History:  50 y.o. female with past medical history relevant for morbid obesity, HTN, chronic hypoxic respiratory failure on 2 to 3 L of oxygen via nasal cannula, ESRD on HD Tuesdays Thursdays and Saturdays with last HD session on 08/30/2020, diabetes and HTN who presents to the ED with recurrent fevers -Patient was apparently discharged from [facility] on 08/31/2020 after being admitted on 08/28/2020 for volume overload and hypertensive urgency with BiPAP and IV nitro and diarrhea back-to-back hemodialysis sessions -- As per patient on 08/31/2020 when she got home she had fever above 102 and chills took some Tylenol and felt better -Patient went to hemodialysis session on 09/01/2020 and was found to have a fever above 102 so she was sent for hemodialysis center to the ED for further evaluation -Patient endorses right upper quadrant tenderness with nausea but no emesis last BM 08/31/2020, no diarrhea, no chest pains no palpitations no shortness of breath -Patient denies productive cough -In the ED chest x-ray without acute findings and COVID negative -In ED WBC is 12.0 hemoglobin is 9.2 -Lipase is 35, sodium is 131 chloride is 96 glucose 170 creatinine 11.2 -CT abdomen pelvis ordered and pending -Blood cultures pending -Patient does not make urine --Patient had a new right IJ HD catheter placed on 08/28/2020 after previous HD catheter fell out on 08/25/2020  Assessment/Plan: Pseudomonas bacteremia -09/01/20 blood culture = Pseudomonas -09/03/20 blood culture = neg -Dialysis catheter removed on 09/03/2020 after last dialysis treatment. -Repeating blood cultures on 09/04/2020 done. -Continue IV cefepime for now. -Cultures results demonstrating pansensitive Pseudomonas. -Continue as needed antipyretics -Continue supportive care and follow clinical response. -Orders for IR  to replace dialysis catheter on 09-06-20 in place.  ESRD -Patient chronically on Tuesday-Thursday-Saturday dialysis schedule -Last hemodialysis as an inpatient on 09/03/2020; at this moment we are anticipating a line holiday while providing antibiotics for his Pseudomonas bacteremia and will have intention to replace dialysis catheter on 6-22 for her next dialysis treatment. -Nephrology service is on board and we will continue to follow further recommendations and dialysis orders while inpatient.  anemia of chronic kidney disease -IV iron and Epogen therapy as per nephrology service. -Hemoglobin stable and no signs of overt bleeding currently.  morbid obesity -Low calorie diet, portion control and increase physical activity has been discussed with patient -Body mass index is 60.39 kg/m.  hypertension -Continue to follow vital signs and adjust antihypertensive agents as needed -Heart healthy/renal diet encouraged. 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  hyperlipidemia -Continue Lipitor. -Low-fat diet encouraged.  chronic respiratory failure with hypoxia -Continue chronic oxygen supplementationat 3L -Good saturation on chronic supplementation.   -Reports no shortness of breath.      Status is: Inpatient  Remains inpatient appropriate because:IV treatments appropriate due to intensity of illness or inability to take PO   Dispo: The patient is from: Home              Anticipated d/c is to: Home              Patient currently is not medically stable to d/c.   Difficult to place patient No        Family Communication:   Family at bedside  Consultants:  renal  Code Status:  FULL  DVT Prophylaxis:  [place] Heparin    Procedures: As Listed in Progress Note  Above  Antibiotics: Cefepime 5/28>>>      Subjective: Patient denies fevers, chills, headache, chest pain, dyspnea, nausea, vomiting, diarrhea, abdominal pain, dysuria, hematuria, hematochezia, and melena.   Objective: Vitals:   09/05/20 0516 09/05/20 0616 09/05/20 0800 09/05/20 1328  BP: (!) 179/90  (!) 141/70 (!) 151/96  Pulse: 63   60  Resp: 18     Temp: 98.2 F (36.8 C)   97.9 F (36.6 C)  TempSrc: Oral   Oral  SpO2: 98%   100%  Weight:  (!) 163.7 kg    Height:        Intake/Output Summary (Last 24 hours) at 09/05/2020 1453 Last data filed at 09/05/2020 0931 Gross per 24 hour  Intake 100 ml  Output --  Net 100 ml   Weight change:  Exam:   General:  Pt is alert, follows commands appropriately, not in acute distress  HEENT: No icterus, No thrush, No neck mass, [place]/AT  Cardiovascular: RRR, S1/S2, no rubs, no gallops  Respiratory: fine bibasilar crackles. No wheeze  Abdomen: Soft/+BS, non tender, non distended, no guarding  Extremities: 2+LE edema, No lymphangitis, No petechiae, No rashes, no synovitis   Data Reviewed: I have personally reviewed following labs and imaging studies Basic Metabolic Panel: Recent Labs  Lab 08/29/20 1948 08/30/20 0224 08/31/20 0714 09/01/20 1500 09/02/20 0617 09/03/20 0914  NA 133* 133* 132* 131* 132* 129*  K 3.4* 3.6 3.9 3.9 3.3* 3.5  CL 96* 95* 94* 96* 93* 91*  CO2 '25 27 28 24 23 25  '$ GLUCOSE 90 78 192* 179* 144* 239*  BUN 40* 25* 27* 51* 30* 54*  CREATININE 9.48* 7.63* 7.44* 11.23* 8.01* 11.37*  CALCIUM 7.9* 8.2* 8.3* 8.0* 8.1* 7.8*  MG  --  2.1 2.1  --   --   --   PHOS 3.7 4.5 5.1*  --   --  5.9*   Liver Function Tests: Recent Labs  Lab 08/29/20 1948 09/01/20 1500 09/02/20 0617 09/03/20 0914  AST  --  14* 25  --   ALT  --  17 23  --   ALKPHOS  --  49 52  --   BILITOT  --  0.7 0.9  --   PROT  --  7.4 7.5  --   ALBUMIN 3.3* 3.5 3.6 3.4*   Recent Labs  Lab 09/01/20 1500  LIPASE 35   No results for  input(s): AMMONIA in the last 168 hours. Coagulation Profile: No results for input(s): INR, PROTIME in the last 168 hours. CBC: Recent Labs  Lab 08/30/20 0224 08/31/20 0714 09/01/20 1500 09/02/20 0617 09/03/20 0914  WBC 12.5* 10.9* 12.0* 14.9* 10.0  NEUTROABS  --   --  10.0*  --   --   HGB 9.3* 9.6* 9.2* 9.2* 8.9*  HCT 30.0* 30.7* 29.8* 30.2* 28.6*  MCV 100.7* 100.3* 103.5* 103.1* 102.1*  PLT 158 161 158 153 158   Cardiac Enzymes: No results for input(s): CKTOTAL, CKMB, CKMBINDEX, TROPONINI in the last 168 hours. BNP: Invalid input(s): POCBNP CBG: Recent Labs  Lab 09/04/20 0736 09/04/20 1117 09/04/20 1657 09/05/20 0742 09/05/20 1150  GLUCAP 151* 161* 192* 181* 173*   HbA1C: No results for input(s): HGBA1C in the last 72 hours. Urine analysis: No results found for: COLORURINE, APPEARANCEUR, LABSPEC, PHURINE, GLUCOSEU, HGBUR, BILIRUBINUR, KETONESUR, PROTEINUR, UROBILINOGEN, NITRITE, LEUKOCYTESUR Sepsis Labs: '@LABRCNTIP'$ (procalcitonin:4,lacticidven:4) ) Recent  Results (from the past 240 hour(s))  Resp Panel by RT-PCR (Flu A&B, Covid) Nasopharyngeal Swab     Status: None   Collection Time: 08/28/20  3:54 PM   Specimen: Nasopharyngeal Swab; Nasopharyngeal(NP) swabs in vial transport medium  Result Value Ref Range Status   SARS Coronavirus 2 by RT PCR NEGATIVE NEGATIVE Final    Comment: (NOTE) SARS-CoV-2 target nucleic acids are NOT DETECTED.  The SARS-CoV-2 RNA is generally detectable in upper respiratory specimens during the acute phase of infection. The lowest concentration of SARS-CoV-2 viral copies this assay can detect is 138 copies/mL. A negative result does not preclude SARS-Cov-2 infection and should not be used as the sole basis for treatment or other patient management decisions. A negative result may occur with  improper specimen collection/handling, submission of specimen other than nasopharyngeal swab, presence of viral mutation(s) within the areas  targeted by this assay, and inadequate number of viral copies(<138 copies/mL). A negative result must be combined with clinical observations, patient history, and epidemiological information. The expected result is Negative.  Fact Sheet for Patients:  EntrepreneurPulse.com.au  Fact Sheet for Healthcare Providers:  IncredibleEmployment.be  This test is no t yet approved or cleared by the [place] FDA and  has been authorized for detection and/or diagnosis of SARS-CoV-2 by FDA under an Emergency Use Authorization (EUA). This EUA will remain  in effect (meaning this test can be used) for the duration of the COVID-19 declaration under Section 564(b)(1) of the Act, 21 U.S.C.section 360bbb-3(b)(1), unless the authorization is terminated  or revoked sooner.       Influenza A by PCR NEGATIVE NEGATIVE Final   Influenza B by PCR NEGATIVE NEGATIVE Final    Comment: (NOTE) The Xpert Xpress SARS-CoV-2/FLU/RSV plus assay is intended as an aid in the diagnosis of influenza from Nasopharyngeal swab specimens and should not be used as a sole basis for treatment. Nasal washings and aspirates are unacceptable for Xpert Xpress SARS-CoV-2/FLU/RSV testing.  Fact Sheet for Patients: EntrepreneurPulse.com.au  Fact Sheet for Healthcare Providers: IncredibleEmployment.be  This test is not yet approved or cleared by the [place] FDA and has been authorized for detection and/or diagnosis of SARS-CoV-2 by FDA under an Emergency Use Authorization (EUA). This EUA will remain in effect (meaning this test can be used) for the duration of the COVID-19 declaration under Section 564(b)(1) of the Act, 21 U.S.C. section 360bbb-3(b)(1), unless the authorization is terminated or revoked.  Performed at [facility], [place] [address], [place], [place] [address]   MRSA PCR Screening     Status: None   Collection Time: 08/28/20  11:48 PM   Specimen: Nasopharyngeal  Result Value Ref Range Status   MRSA by PCR NEGATIVE NEGATIVE Final    Comment:        The GeneXpert MRSA Assay (FDA approved for NASAL specimens only), is one component of a comprehensive MRSA colonization surveillance program. It is not intended to diagnose MRSA infection nor to guide or monitor treatment for MRSA infections. Performed at [facility], [place] [address], [place], [place] [address]   Resp Panel by RT-PCR (Flu A&B, Covid) Nasopharyngeal Swab     Status: None   Collection Time: 09/01/20  1:45 PM   Specimen: Nasopharyngeal Swab; Nasopharyngeal(NP) swabs in vial transport medium  Result Value Ref Range Status   SARS Coronavirus 2 by RT PCR NEGATIVE NEGATIVE Final    Comment: (NOTE) SARS-CoV-2 target nucleic acids are NOT DETECTED.  The SARS-CoV-2 RNA is generally  detectable in upper respiratory specimens during the acute phase of infection. The lowest concentration of SARS-CoV-2 viral copies this assay can detect is 138 copies/mL. A negative result does not preclude SARS-Cov-2 infection and should not be used as the sole basis for treatment or other patient management decisions. A negative result may occur with  improper specimen collection/handling, submission of specimen other than nasopharyngeal swab, presence of viral mutation(s) within the areas targeted by this assay, and inadequate number of viral copies(<138 copies/mL). A negative result must be combined with clinical observations, patient history, and epidemiological information. The expected result is Negative.  Fact Sheet for Patients:  EntrepreneurPulse.com.au  Fact Sheet for Healthcare Providers:  IncredibleEmployment.be  This test is no t yet approved or cleared by the [place] FDA and  has been authorized for detection and/or diagnosis of SARS-CoV-2 by FDA under an Emergency Use Authorization (EUA). This EUA will  remain  in effect (meaning this test can be used) for the duration of the COVID-19 declaration under Section 564(b)(1) of the Act, 21 U.S.C.section 360bbb-3(b)(1), unless the authorization is terminated  or revoked sooner.       Influenza A by PCR NEGATIVE NEGATIVE Final   Influenza B by PCR NEGATIVE NEGATIVE Final    Comment: (NOTE) The Xpert Xpress SARS-CoV-2/FLU/RSV plus assay is intended as an aid in the diagnosis of influenza from Nasopharyngeal swab specimens and should not be used as a sole basis for treatment. Nasal washings and aspirates are unacceptable for Xpert Xpress SARS-CoV-2/FLU/RSV testing.  Fact Sheet for Patients: EntrepreneurPulse.com.au  Fact Sheet for Healthcare Providers: IncredibleEmployment.be  This test is not yet approved or cleared by the [place] FDA and has been authorized for detection and/or diagnosis of SARS-CoV-2 by FDA under an Emergency Use Authorization (EUA). This EUA will remain in effect (meaning this test can be used) for the duration of the COVID-19 declaration under Section 564(b)(1) of the Act, 21 U.S.C. section 360bbb-3(b)(1), unless the authorization is terminated or revoked.  Performed at [facility], [address], [place], [place] [address]   Culture, blood (routine x 2)     Status: Abnormal   Collection Time: 09/01/20  3:00 PM   Specimen: Right Antecubital; Blood  Result Value Ref Range Status   Specimen Description   Final    RIGHT ANTECUBITAL Performed at [facility], [address] [place], [place] [address]    Special Requests   Final    Blood Culture results may not be optimal due to an inadequate volume of blood received in culture bottles BOTTLES DRAWN AEROBIC AND ANAEROBIC Performed at [facility], [address] [place], [place] [address]    Culture  Setup Time   Final    GRAM NEGATIVE RODS Gram Stain Report Called to,Read Back By and Verified With: RACHEAL TATE,RN  '@1154'$  09/02/2020 KAY AEROBIC BOTTLE ONLY CRITICAL RESULT CALLED TO, READ BACK BY AND VERIFIED WITH: CRYSTAL REYNOLDS RN '@1625'$  09/02/20 EB Performed at [facility] [address] [place], [place] [address]    Culture PSEUDOMONAS AERUGINOSA (A)  Final   Report Status 09/04/2020 FINAL  Final   Organism ID, Bacteria PSEUDOMONAS AERUGINOSA  Final      Susceptibility   Pseudomonas aeruginosa - MIC*    CEFTAZIDIME <=1 SENSITIVE Sensitive     CIPROFLOXACIN <=0.25 SENSITIVE Sensitive     GENTAMICIN <=1 SENSITIVE Sensitive     IMIPENEM 1 SENSITIVE Sensitive     PIP/TAZO <=4 SENSITIVE Sensitive     CEFEPIME  1 SENSITIVE Sensitive     * PSEUDOMONAS AERUGINOSA  Blood Culture ID Panel (Reflexed)     Status: Abnormal   Collection Time: 09/01/20  3:00 PM  Result Value Ref Range Status   Enterococcus faecalis NOT DETECTED NOT DETECTED Final   Enterococcus Faecium NOT DETECTED NOT DETECTED Final   Listeria monocytogenes NOT DETECTED NOT DETECTED Final   Staphylococcus species NOT DETECTED NOT DETECTED Final   Staphylococcus aureus (BCID) NOT DETECTED NOT DETECTED Final   Staphylococcus epidermidis NOT DETECTED NOT DETECTED Final   Staphylococcus lugdunensis NOT DETECTED NOT DETECTED Final   Streptococcus species NOT DETECTED NOT DETECTED Final   Streptococcus agalactiae NOT DETECTED NOT DETECTED Final   Streptococcus pneumoniae NOT DETECTED NOT DETECTED Final   Streptococcus pyogenes NOT DETECTED NOT DETECTED Final   A.calcoaceticus-baumannii NOT DETECTED NOT DETECTED Final   Bacteroides fragilis NOT DETECTED NOT DETECTED Final   Enterobacterales NOT DETECTED NOT DETECTED Final   Enterobacter cloacae complex NOT DETECTED NOT DETECTED Final   Escherichia coli NOT DETECTED NOT DETECTED Final   Klebsiella aerogenes NOT DETECTED NOT DETECTED Final   Klebsiella oxytoca NOT DETECTED NOT DETECTED Final   Klebsiella pneumoniae NOT DETECTED NOT DETECTED Final   Proteus species NOT DETECTED NOT  DETECTED Final   Salmonella species NOT DETECTED NOT DETECTED Final   Serratia marcescens NOT DETECTED NOT DETECTED Final   Haemophilus influenzae NOT DETECTED NOT DETECTED Final   Neisseria meningitidis NOT DETECTED NOT DETECTED Final   Pseudomonas aeruginosa DETECTED (A) NOT DETECTED Final    Comment: CRITICAL RESULT CALLED TO, READ BACK BY AND VERIFIED WITH: CRYSTAL REYNOLDS RN '@1625'$  09/02/20 EB    Stenotrophomonas maltophilia NOT DETECTED NOT DETECTED Final   Candida albicans NOT DETECTED NOT DETECTED Final   Candida auris NOT DETECTED NOT DETECTED Final   Candida glabrata NOT DETECTED NOT DETECTED Final   Candida krusei NOT DETECTED NOT DETECTED Final   Candida parapsilosis NOT DETECTED NOT DETECTED Final   Candida tropicalis NOT DETECTED NOT DETECTED Final   Cryptococcus neoformans/gattii NOT DETECTED NOT DETECTED Final   CTX-M ESBL NOT DETECTED NOT DETECTED Final   Carbapenem resistance IMP NOT DETECTED NOT DETECTED Final   Carbapenem resistance KPC NOT DETECTED NOT DETECTED Final   Carbapenem resistance NDM NOT DETECTED NOT DETECTED Final   Carbapenem resistance VIM NOT DETECTED NOT DETECTED Final    Comment: Performed at [facility], [address], [place], [place] [address]  Culture, blood (routine x 2)     Status: Abnormal   Collection Time: 09/01/20  3:49 PM   Specimen: BLOOD LEFT HAND  Result Value Ref Range Status   Specimen Description   Final    BLOOD LEFT HAND Performed at [facility], [address] [place], [place] [address]    Special Requests   Final    Blood Culture adequate volume BOTTLES DRAWN AEROBIC AND ANAEROBIC Performed at [facility], [address], [place], [place] [address]    Culture  Setup Time   Final    GRAM NEGATIVE RODS ANAEROBIC BOTTLE ONLY CRYSTAL REYNOLDS,RN '@1306'$  09/02/2020 KAY ANAEROBIC BOTTLE GRAM NEGATIVE RODS RESULT PREVIOUSLY CALLED Performed at [facility], [address] [place], [place] [address]    Culture (A)   Final    PSEUDOMONAS AERUGINOSA SUSCEPTIBILITIES PERFORMED ON PREVIOUS CULTURE WITHIN THE LAST 5 DAYS. Performed at [facility], [place] [address] [place], [place] [address]    Report Status 09/04/2020 FINAL  Final  Culture, blood (Routine  X 2) w Reflex to ID Panel     Status: None (Preliminary result)   Collection Time: 09/03/20 11:54 PM   Specimen: BLOOD LEFT FOREARM  Result Value Ref Range Status   Specimen Description BLOOD LEFT FOREARM  Final   Special Requests   Final    BOTTLES DRAWN AEROBIC AND ANAEROBIC Blood Culture adequate volume   Culture   Final    NO GROWTH 1 DAY Performed at [facility], [address] [place], [place] [address]    Report Status PENDING  Incomplete  Culture, blood (Routine X 2) w Reflex to ID Panel     Status: None (Preliminary result)   Collection Time: 09/03/20 11:59 PM   Specimen: BLOOD LEFT HAND  Result Value Ref Range Status   Specimen Description BLOOD LEFT HAND  Final   Special Requests   Final    BOTTLES DRAWN AEROBIC AND ANAEROBIC Blood Culture adequate volume   Culture   Final    NO GROWTH 1 DAY Performed at [facility], [address], [place], [place] [address]    Report Status PENDING  Incomplete     Scheduled Meds: . amLODipine  10 mg Oral Daily  . aspirin EC  81 mg Oral Daily  . atorvastatin  10 mg Oral q1800  . carvedilol  25 mg Oral BID  . Chlorhexidine Gluconate Cloth  6 each Topical Q0600  . Chlorhexidine Gluconate Cloth  6 each Topical Q0600  . darbepoetin (ARANESP) injection - DIALYSIS  100 mcg Intravenous Once  . doxercalciferol  5 mcg Intravenous Q T,Th,Sa-HD  . famotidine  20 mg Oral Daily  . gabapentin  100 mg Oral 2 times per day on Sun Mon Wed Fri   And  . gabapentin  100 mg Oral 3 times per day on Tue Thu Sat  . heparin  5,000 Units Subcutaneous Q8H  . insulin aspart  0-5 Units Subcutaneous QHS  . insulin aspart  0-6 Units Subcutaneous TID WC  . insulin glargine  12 Units Subcutaneous QHS  .  lidocaine-EPINEPHrine  20 mL Intradermal Once  . lisinopril  5 mg Oral Daily  . senna-docusate  2 tablet Oral QHS  . sodium chloride flush  3 mL Intravenous Q12H  . sodium chloride flush  3 mL Intravenous Q12H  . tiZANidine  4 mg Oral Q8H   Continuous Infusions: . sodium chloride    . sodium chloride    . sodium chloride    . ceFEPime (MAXIPIME) IV    . [START ON 09/06/2020] vancomycin      Procedures/Studies: CT ABDOMEN PELVIS WO CONTRAST  Result Date: 09/02/2020 CLINICAL DATA:  Abdominal pain and fever EXAM: CT ABDOMEN AND PELVIS WITHOUT CONTRAST TECHNIQUE: Multidetector CT imaging of the abdomen and pelvis was performed following the standard protocol without IV contrast. COMPARISON:  04/08/2020 FINDINGS: Lower chest: Mild scarring is noted in the lung bases bilaterally. No sizable effusion is seen. Hepatobiliary: No focal liver abnormality is seen. No gallstones, gallbladder wall thickening, or biliary dilatation. Pancreas: Unremarkable. No pancreatic ductal dilatation or surrounding inflammatory changes. Spleen: Normal in size without focal abnormality. Adrenals/Urinary Tract: Adrenal glands are within normal limits. Kidneys demonstrate mild atrophy bilaterally. Perinephric stranding is noted similar to that seen on prior exams. Vascular calcifications are noted. No obstructive changes are seen. Bladder is decompressed. Stomach/Bowel: The appendix is well visualized and within normal limits. Colon shows no obstructive or inflammatory changes. Small bowel and stomach appear within normal limits. Vascular/Lymphatic: Aortic atherosclerosis. No  enlarged abdominal or pelvic lymph nodes. Reproductive: Uterus and bilateral adnexa are unremarkable. Other: No abdominal wall hernia or abnormality. No abdominopelvic ascites. Musculoskeletal: Degenerative changes of lumbar spine are noted. IMPRESSION: Renal atrophy similar to that seen on the prior exam. No acute abnormality noted. Electronically Signed    By: Inez Catalina M.D.   On: 09/02/2020 15:47   DG Chest Port 1 View  Result Date: 09/03/2020 CLINICAL DATA:  Status post dialysis catheter removal EXAM: PORTABLE CHEST 1 VIEW COMPARISON:  09/01/2020 FINDINGS: Right jugular dialysis catheter has been removed in the interval. Cardiac shadow remains prominent. Lungs are well aerated bilaterally. No focal infiltrate or effusion is seen. No acute bony abnormality is noted. IMPRESSION: No acute abnormality noted. Electronically Signed   By: Inez Catalina M.D.   On: 09/03/2020 15:39   DG Chest Portable 1 View  Result Date: 09/01/2020 CLINICAL DATA:  Elevated temperature during dialysis today. Weakness and fever. EXAM: PORTABLE CHEST 1 VIEW COMPARISON:  08/28/2020 and older exams. FINDINGS: Cardiac silhouette normal in size.  No mediastinal or hilar masses. Lungs clear. Opacity noted at the lung bases on the recent prior study is no longer visualized. Lung bases are somewhat suboptimally visualized due to the significant overlying soft tissue. Cannot exclude a small effusion. No pneumothorax. Right anterior chest wall internal jugular tunneled central venous catheter is stable. IMPRESSION: 1. No acute cardiopulmonary disease. Electronically Signed   By: Lajean Manes M.D.   On: 09/01/2020 14:55   DG Chest Portable 1 View  Result Date: 08/28/2020 CLINICAL DATA:  50 year old female with respiratory failure. EXAM: PORTABLE CHEST 1 VIEW COMPARISON:  Chest radiograph dated 08/25/2020. FINDINGS: Right IJ dialysis catheter with tip over the right atrium. There is shallow inspiration. There is cardiomegaly with vascular congestion and edema. Significant worsening of CHF or fluid overload compared to the prior radiograph. Small bilateral pleural effusions and bibasilar atelectasis. Pneumonia is not excluded. No pneumothorax. No acute osseous pathology. IMPRESSION: Cardiomegaly with significant worsening of CHF or fluid overload compared to the prior radiograph.  Electronically Signed   By: Anner Crete M.D.   On: 08/28/2020 16:23   DG Chest Port 1 View  Result Date: 08/25/2020 CLINICAL DATA:  Weakness. EXAM: PORTABLE CHEST 1 VIEW COMPARISON:  November 28, 2019 FINDINGS: A right-sided venous catheter is seen with its distal tip overlying the midportion of the superior vena cava. This is approximately 5.0 cm proximal to the junction of the superior vena cava and right atrium. There is no evidence of acute infiltrate, pleural effusion or pneumothorax. Stable moderate to marked severity enlargement of the cardiac silhouette is seen. The visualized skeletal structures are unremarkable. IMPRESSION: Right-sided venous catheter positioning, as described above. Electronically Signed   By: Virgina Norfolk M.D.   On: 08/25/2020 15:43    Orson Eva, DO  [facility]  If 7PM-7AM, please contact night-coverage [email or web address] Password [email or web address] 09/05/2020, 2:53 PM   LOS: 3 days

## 2020-09-05 NOTE — Progress Notes (Addendum)
Patient called needing help getting in bed. Helped her lay down in bed and adjusted pillows behind her back and neck. Covered her with blankets and moved bedside table/trashcan within reach. Call bell within reach. Also brought in toiletry items she requested for bed bath in the a.m.

## 2020-09-05 NOTE — Progress Notes (Signed)
Again attempted to round on patient, still resting at this time.

## 2020-09-06 ENCOUNTER — Ambulatory Visit (HOSPITAL_COMMUNITY)
Admission: RE | Admit: 2020-09-06 | Discharge: 2020-09-06 | Disposition: A | Discharge status: Home / Self Care | Payer: Medicare Other | Source: Ambulatory Visit | Attending: Internal Medicine | Admitting: Internal Medicine

## 2020-09-06 ENCOUNTER — Encounter (HOSPITAL_COMMUNITY): Payer: Self-pay

## 2020-09-06 DIAGNOSIS — J9611 Chronic respiratory failure with hypoxia: Secondary | ICD-10-CM | POA: Diagnosis not present

## 2020-09-06 DIAGNOSIS — Z4901 Encounter for fitting and adjustment of extracorporeal dialysis catheter: Secondary | ICD-10-CM | POA: Diagnosis not present

## 2020-09-06 DIAGNOSIS — Z7982 Long term (current) use of aspirin: Secondary | ICD-10-CM | POA: Insufficient documentation

## 2020-09-06 DIAGNOSIS — Z885 Allergy status to narcotic agent status: Secondary | ICD-10-CM | POA: Insufficient documentation

## 2020-09-06 DIAGNOSIS — Z794 Long term (current) use of insulin: Secondary | ICD-10-CM | POA: Insufficient documentation

## 2020-09-06 DIAGNOSIS — R7881 Bacteremia: Secondary | ICD-10-CM | POA: Insufficient documentation

## 2020-09-06 DIAGNOSIS — Z992 Dependence on renal dialysis: Secondary | ICD-10-CM | POA: Insufficient documentation

## 2020-09-06 DIAGNOSIS — Z79899 Other long term (current) drug therapy: Secondary | ICD-10-CM | POA: Insufficient documentation

## 2020-09-06 DIAGNOSIS — Z888 Allergy status to other drugs, medicaments and biological substances status: Secondary | ICD-10-CM | POA: Insufficient documentation

## 2020-09-06 DIAGNOSIS — I120 Hypertensive chronic kidney disease with stage 5 chronic kidney disease or end stage renal disease: Secondary | ICD-10-CM | POA: Insufficient documentation

## 2020-09-06 DIAGNOSIS — E1122 Type 2 diabetes mellitus with diabetic chronic kidney disease: Secondary | ICD-10-CM | POA: Insufficient documentation

## 2020-09-06 DIAGNOSIS — Z881 Allergy status to other antibiotic agents status: Secondary | ICD-10-CM | POA: Insufficient documentation

## 2020-09-06 DIAGNOSIS — N186 End stage renal disease: Secondary | ICD-10-CM | POA: Diagnosis not present

## 2020-09-06 HISTORY — PX: IR US GUIDE VASC ACCESS LEFT: IMG2389

## 2020-09-06 HISTORY — PX: IR PERC TUN PERIT CATH WO PORT S&I /IMAG: IMG2327

## 2020-09-06 LAB — GLUCOSE, CAPILLARY
Glucose-Capillary: 161 mg/dL — ABNORMAL HIGH (ref 70–99)
Glucose-Capillary: 138 mg/dL — ABNORMAL HIGH (ref 70–99)

## 2020-09-06 MED ORDER — MIDAZOLAM HCL 2 MG/2ML IJ SOLN
INTRAMUSCULAR | Status: AC | PRN
  Administered 2020-09-06: 1 mg via INTRAVENOUS

## 2020-09-06 MED ORDER — MIDAZOLAM HCL 2 MG/2ML IJ SOLN
INTRAMUSCULAR | Status: AC
  Filled 2020-09-06: qty 4

## 2020-09-06 MED ORDER — HEPARIN SODIUM (PORCINE) 1000 UNIT/ML IJ SOLN
INTRAMUSCULAR | Status: AC
  Filled 2020-09-06: qty 1

## 2020-09-06 MED ORDER — HEPARIN SODIUM (PORCINE) 1000 UNIT/ML IJ SOLN
INTRAMUSCULAR | Status: AC
  Filled 2020-09-06: qty 5

## 2020-09-06 MED ORDER — HEPARIN SODIUM (PORCINE) 1000 UNIT/ML IJ SOLN
INTRAMUSCULAR | Status: AC | PRN
  Administered 2020-09-06 (×2): 1.2 mL via INTRAVENOUS

## 2020-09-06 MED ORDER — VANCOMYCIN HCL IN DEXTROSE 1-5 GM/200ML-% IV SOLN: 1000.0000 mg | Freq: Once | INTRAVENOUS | Status: DC

## 2020-09-06 MED ORDER — VANCOMYCIN HCL 1500 MG/300ML IV SOLN
1500.0000 mg | INTRAVENOUS | Status: AC
  Administered 2020-09-06: 1500 mg via INTRAVENOUS
  Filled 2020-09-06: qty 300

## 2020-09-06 MED ORDER — LIDOCAINE HCL (PF) 1 % IJ SOLN
INTRAMUSCULAR | Status: AC | PRN
  Administered 2020-09-06: 5 mL

## 2020-09-06 MED ORDER — HEPARIN SODIUM (PORCINE) 1000 UNIT/ML IJ SOLN
4200.0000 [IU] | Freq: Once | INTRAMUSCULAR | Status: AC
  Administered 2020-09-06: 4200 [IU]

## 2020-09-06 MED ORDER — DARBEPOETIN ALFA 100 MCG/0.5ML IJ SOSY
PREFILLED_SYRINGE | INTRAMUSCULAR | Status: AC
  Filled 2020-09-06: qty 0.5

## 2020-09-06 MED ORDER — FENTANYL CITRATE (PF) 100 MCG/2ML IJ SOLN
INTRAMUSCULAR | Status: AC
  Filled 2020-09-06: qty 2

## 2020-09-06 MED ORDER — DOXERCALCIFEROL 4 MCG/2ML IV SOLN
INTRAVENOUS | Status: AC
  Filled 2020-09-06: qty 4

## 2020-09-06 MED ORDER — LIDOCAINE HCL (PF) 1 % IJ SOLN
INTRAMUSCULAR | Status: AC
  Filled 2020-09-06: qty 30

## 2020-09-06 MED ORDER — SODIUM CHLORIDE 0.9 % IV SOLN
INTRAVENOUS | Status: AC | PRN
  Administered 2020-09-06: 250 mL via INTRAVENOUS

## 2020-09-06 MED ORDER — SODIUM CHLORIDE 0.9 % IV SOLN: 2.0000 g | INTRAVENOUS | Status: DC

## 2020-09-06 MED ORDER — FENTANYL CITRATE (PF) 100 MCG/2ML IJ SOLN
INTRAMUSCULAR | Status: AC | PRN
  Administered 2020-09-06: 50 ug via INTRAVENOUS

## 2020-09-06 MED ORDER — GELATIN ABSORBABLE 12-7 MM EX MISC
CUTANEOUS | Status: AC
  Filled 2020-09-06: qty 1

## 2020-09-06 NOTE — Progress Notes (Signed)
PROGRESS NOTE  Courtney Gray [id number] DOB: Feb 18, 1971 DOA: 09/01/2020 PCP: [facility], [facility]  Brief History:  50 y.o.femalewith past medical history relevant for morbid obesity, HTN, chronic hypoxic respiratory failure on 2 to 3 L of oxygen via nasal cannula, ESRD on HD Tuesdays Thursdays and Saturdays with last HD session on 08/30/2020,diabetes and HTN who presents to the ED with recurrent fevers -Patient was apparently discharged from [facility] on 08/31/2020 after being admitted on 08/28/2020 for volume overload and hypertensive urgency with BiPAP and IV nitro and diarrhea back-to-back hemodialysis sessions --As per patient on 08/31/2020 when she got home she had fever above 102 and chills took some Tylenol and felt better -Patient went to hemodialysis session on 09/01/2020 and was found to have a fever above 102 so she was sent for hemodialysis center to the ED for further evaluation -Patient endorses right upper quadrant tenderness with nausea but no emesis last BM 08/31/2020,no diarrhea, no chest pains no palpitations no shortness of breath -Patient denies productive cough -In the ED chest x-ray without acute findings and COVID negative -In ED WBC is 12.0 hemoglobin is 9.2 -Lipase is 35,sodium is 131 chloride is 96 glucose 170 creatinine 11.2 -CT abdomen pelvis ordered and pending -Blood cultures pending -Patient does not make urine --Patient had a new right IJ HD catheter placed on 08/28/2020 after previous HD catheter fell out on 08/25/2020  Assessment/Plan: Pseudomonas bacteremia -09/01/20 blood culture = Pseudomonas -09/03/20 blood culture = neg -Dialysis catheter removed on 09/03/2020 after last dialysis treatment. -Repeating blood cultures on 09/03/2020--neg -Continue IV cefepimefor now. -Cultures results demonstrating pansensitive Pseudomonas. -Continue as needed antipyretics -Continue supportive care and follow clinical response. -IR placed new  dialysis catheter on 09-06-20  -plan to d/c with cefepime or ceftazidime on HD with last dose on 09/15/20 HD  ESRD -Patient chronically on Tuesday-Thursday-Saturday dialysis schedule -Last hemodialysis as an inpatient on 09/03/2020; at this moment we are anticipating a line holiday while providing antibiotics for his Pseudomonas bacteremia and will have intention to replace dialysis catheter on 6-22 forhernext dialysis treatment. -Nephrology service is on board and we will continue to follow further recommendations and dialysis orders while inpatient.  anemia of chronic kidney disease -IV iron and Epogen therapy as per nephrology service. -Hemoglobin stable and no signs of overt bleeding currently.  morbid obesity -Low calorie diet, portion control and increase physical activity has been discussed with patient -Body mass index is 60.39 kg/m.  hypertension -Continue to follow vital signs and adjust antihypertensive agents as needed -Heart healthy/renal diet encouraged. 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  hyperlipidemia -Continue Lipitor. -Low-fat diet encouraged.  chronic respiratory failure with hypoxia -Continue chronic oxygen supplementationat 3L -Good saturation on chronic supplementation. -Reports no shortness of breath.      Status is: Inpatient  Remains inpatient appropriate because:IV treatments appropriate due to intensity of illness or inability to take PO   Dispo: The patient is from: Home  Anticipated d/c is to: Home  Patient currently is not medically stable to d/c.              Difficult to place patient No        Family Communication: no  Family at bedside  Consultants:  renal  Code Status:  FULL  DVT  Prophylaxis:  [place] Heparin  Procedures: As Listed in Progress Note Above  Antibiotics: Cefepime 5/28>>>    Subjective: Patient denies fevers, chills, headache, chest pain, dyspnea, nausea, vomiting, diarrhea, abdominal pain, dysuria,   Objective: Vitals:   09/06/20 1611 09/06/20 1630 09/06/20 1700 09/06/20 1716  BP: (!) 170/100 (!) 180/82 (!) 179/75 (!) 167/75  Pulse: 62 64 65 60  Resp: '16 16 16 16  '$ Temp: 98 F (36.7 C)     TempSrc: Oral     SpO2: 100%     Weight:      Height:        Intake/Output Summary (Last 24 hours) at 09/06/2020 1755 Last data filed at 09/06/2020 1423 Gross per 24 hour  Intake 996.11 ml  Output --  Net 996.11 ml   Weight change: 1 kg Exam:   General:  Pt is alert, follows commands appropriately, not in acute distress  HEENT: No icterus, No thrush, No neck mass, /AT  Cardiovascular: RRR, S1/S2, no rubs, no gallops  Respiratory: bibasilar rales. No wheeze  Abdomen: Soft/+BS, non tender, non distended, no guarding  Extremities: 1+ LE edema, No lymphangitis, No petechiae, No rashes, no synovitis   Data Reviewed: I have personally reviewed following labs and imaging studies Basic Metabolic Panel: Recent Labs  Lab 08/31/20 0714 09/01/20 1500 09/02/20 0617 09/03/20 0914  NA 132* 131* 132* 129*  K 3.9 3.9 3.3* 3.5  CL 94* 96* 93* 91*  CO2 '28 24 23 25  '$ GLUCOSE 192* 179* 144* 239*  BUN 27* 51* 30* 54*  CREATININE 7.44* 11.23* 8.01* 11.37*  CALCIUM 8.3* 8.0* 8.1* 7.8*  MG 2.1  --   --   --   PHOS 5.1*  --   --  5.9*   Liver Function Tests: Recent Labs  Lab 09/01/20 1500 09/02/20 0617 09/03/20 0914  AST 14* 25  --   ALT 17 23  --   ALKPHOS 49 52  --   BILITOT 0.7 0.9  --   PROT 7.4 7.5  --   ALBUMIN 3.5 3.6 3.4*   Recent Labs  Lab 09/01/20 1500  LIPASE 35   No results for input(s): AMMONIA in the last 168 hours. Coagulation Profile: No results for input(s): INR, PROTIME in the last 168 hours. CBC: Recent Labs   Lab 08/31/20 0714 09/01/20 1500 09/02/20 0617 09/03/20 0914  WBC 10.9* 12.0* 14.9* 10.0  NEUTROABS  --  10.0*  --   --   HGB 9.6* 9.2* 9.2* 8.9*  HCT 30.7* 29.8* 30.2* 28.6*  MCV 100.3* 103.5* 103.1* 102.1*  PLT 161 158 153 158   Cardiac Enzymes: No results for input(s): CKTOTAL, CKMB, CKMBINDEX, TROPONINI in the last 168 hours. BNP: Invalid input(s): POCBNP CBG: Recent Labs  Lab 09/05/20 0742 09/05/20 1150 09/05/20 1718 09/05/20 2205 09/06/20 0728  GLUCAP 181* 173* 175* 217* 161*   HbA1C: No results for input(s): HGBA1C in the last 72 hours. Urine analysis: No results found for: COLORURINE, APPEARANCEUR, LABSPEC, PHURINE, GLUCOSEU, HGBUR, BILIRUBINUR, KETONESUR, PROTEINUR, UROBILINOGEN, NITRITE, LEUKOCYTESUR Sepsis Labs: '@LABRCNTIP'$ (procalcitonin:4,lacticidven:4) ) Recent Results (from the past 240 hour(s))  Resp Panel by RT-PCR (Flu A&B, Covid) Nasopharyngeal Swab     Status: None   Collection Time: 08/28/20  3:54 PM   Specimen: Nasopharyngeal Swab; Nasopharyngeal(NP) swabs in vial transport medium  Result Value Ref Range Status   SARS Coronavirus 2 by RT PCR NEGATIVE NEGATIVE Final    Comment: (NOTE) SARS-CoV-2 target nucleic acids are NOT DETECTED.  The SARS-CoV-2 RNA is generally detectable in upper  respiratory specimens during the acute phase of infection. The lowest concentration of SARS-CoV-2 viral copies this assay can detect is 138 copies/mL. A negative result does not preclude SARS-Cov-2 infection and should not be used as the sole basis for treatment or other patient management decisions. A negative result may occur with  improper specimen collection/handling, submission of specimen other than nasopharyngeal swab, presence of viral mutation(s) within the areas targeted by this assay, and inadequate number of viral copies(<138 copies/mL). A negative result must be combined with clinical observations, patient history, and epidemiological information.  The expected result is Negative.  Fact Sheet for Patients:  EntrepreneurPulse.com.au  Fact Sheet for Healthcare Providers:  IncredibleEmployment.be  This test is no t yet approved or cleared by the [place] FDA and  has been authorized for detection and/or diagnosis of SARS-CoV-2 by FDA under an Emergency Use Authorization (EUA). This EUA will remain  in effect (meaning this test can be used) for the duration of the COVID-19 declaration under Section 564(b)(1) of the Act, 21 U.S.C.section 360bbb-3(b)(1), unless the authorization is terminated  or revoked sooner.       Influenza A by PCR NEGATIVE NEGATIVE Final   Influenza B by PCR NEGATIVE NEGATIVE Final    Comment: (NOTE) The Xpert Xpress SARS-CoV-2/FLU/RSV plus assay is intended as an aid in the diagnosis of influenza from Nasopharyngeal swab specimens and should not be used as a sole basis for treatment. Nasal washings and aspirates are unacceptable for Xpert Xpress SARS-CoV-2/FLU/RSV testing.  Fact Sheet for Patients: EntrepreneurPulse.com.au  Fact Sheet for Healthcare Providers: IncredibleEmployment.be  This test is not yet approved or cleared by the [place] FDA and has been authorized for detection and/or diagnosis of SARS-CoV-2 by FDA under an Emergency Use Authorization (EUA). This EUA will remain in effect (meaning this test can be used) for the duration of the COVID-19 declaration under Section 564(b)(1) of the Act, 21 U.S.C. section 360bbb-3(b)(1), unless the authorization is terminated or revoked.  Performed at [facility], [place] [address] [place], [place] [address]   MRSA PCR Screening     Status: None   Collection Time: 08/28/20 11:48 PM   Specimen: Nasopharyngeal  Result Value Ref Range Status   MRSA by PCR NEGATIVE NEGATIVE Final    Comment:        The GeneXpert MRSA Assay (FDA approved for NASAL specimens only),  is one component of a comprehensive MRSA colonization surveillance program. It is not intended to diagnose MRSA infection nor to guide or monitor treatment for MRSA infections. Performed at [facility], [place] [address], [place], [place] [address]   Resp Panel by RT-PCR (Flu A&B, Covid) Nasopharyngeal Swab     Status: None   Collection Time: 09/01/20  1:45 PM   Specimen: Nasopharyngeal Swab; Nasopharyngeal(NP) swabs in vial transport medium  Result Value Ref Range Status   SARS Coronavirus 2 by RT PCR NEGATIVE NEGATIVE Final    Comment: (NOTE) SARS-CoV-2 target nucleic acids are NOT DETECTED.  The SARS-CoV-2 RNA is generally detectable in upper respiratory specimens during the acute phase of infection. The lowest concentration of SARS-CoV-2 viral copies this assay can detect is 138 copies/mL. A negative result does not preclude SARS-Cov-2 infection and should not be used as the sole basis for treatment or other patient management decisions. A negative result may occur with  improper specimen collection/handling, submission of specimen other than nasopharyngeal swab, presence of viral mutation(s) within the areas targeted by this assay, and inadequate  number of viral copies(<138 copies/mL). A negative result must be combined with clinical observations, patient history, and epidemiological information. The expected result is Negative.  Fact Sheet for Patients:  EntrepreneurPulse.com.au  Fact Sheet for Healthcare Providers:  IncredibleEmployment.be  This test is no t yet approved or cleared by the [place] FDA and  has been authorized for detection and/or diagnosis of SARS-CoV-2 by FDA under an Emergency Use Authorization (EUA). This EUA will remain  in effect (meaning this test can be used) for the duration of the COVID-19 declaration under Section 564(b)(1) of the Act, 21 U.S.C.section 360bbb-3(b)(1), unless the authorization is  terminated  or revoked sooner.       Influenza A by PCR NEGATIVE NEGATIVE Final   Influenza B by PCR NEGATIVE NEGATIVE Final    Comment: (NOTE) The Xpert Xpress SARS-CoV-2/FLU/RSV plus assay is intended as an aid in the diagnosis of influenza from Nasopharyngeal swab specimens and should not be used as a sole basis for treatment. Nasal washings and aspirates are unacceptable for Xpert Xpress SARS-CoV-2/FLU/RSV testing.  Fact Sheet for Patients: EntrepreneurPulse.com.au  Fact Sheet for Healthcare Providers: IncredibleEmployment.be  This test is not yet approved or cleared by the [place] FDA and has been authorized for detection and/or diagnosis of SARS-CoV-2 by FDA under an Emergency Use Authorization (EUA). This EUA will remain in effect (meaning this test can be used) for the duration of the COVID-19 declaration under Section 564(b)(1) of the Act, 21 U.S.C. section 360bbb-3(b)(1), unless the authorization is terminated or revoked.  Performed at [facility], [address] [place], [place] [address]   Culture, blood (routine x 2)     Status: Abnormal   Collection Time: 09/01/20  3:00 PM   Specimen: Right Antecubital; Blood  Result Value Ref Range Status   Specimen Description   Final    RIGHT ANTECUBITAL Performed at [facility], [address] [place], [place] [address]    Special Requests   Final    Blood Culture results may not be optimal due to an inadequate volume of blood received in culture bottles BOTTLES DRAWN AEROBIC AND ANAEROBIC Performed at [facility], [address] [place], [place] [address]    Culture  Setup Time   Final    GRAM NEGATIVE RODS Gram Stain Report Called to,Read Back By and Verified With: RACHEAL TATE,RN '@1154'$  09/02/2020 KAY AEROBIC BOTTLE ONLY CRITICAL RESULT CALLED TO, READ BACK BY AND VERIFIED WITH: CRYSTAL REYNOLDS RN '@1625'$  09/02/20 EB Performed at [facility], [place] [address],  [place], [place] [address]    Culture PSEUDOMONAS AERUGINOSA (A)  Final   Report Status 09/04/2020 FINAL  Final   Organism ID, Bacteria PSEUDOMONAS AERUGINOSA  Final      Susceptibility   Pseudomonas aeruginosa - MIC*    CEFTAZIDIME <=1 SENSITIVE Sensitive     CIPROFLOXACIN <=0.25 SENSITIVE Sensitive     GENTAMICIN <=1 SENSITIVE Sensitive     IMIPENEM 1 SENSITIVE Sensitive     PIP/TAZO <=4 SENSITIVE Sensitive     CEFEPIME 1 SENSITIVE Sensitive     * PSEUDOMONAS AERUGINOSA  Blood Culture ID Panel (Reflexed)     Status: Abnormal   Collection Time: 09/01/20  3:00 PM  Result Value Ref Range Status   Enterococcus faecalis NOT DETECTED NOT DETECTED Final   Enterococcus Faecium NOT DETECTED NOT DETECTED Final   Listeria monocytogenes NOT DETECTED NOT DETECTED Final   Staphylococcus species NOT DETECTED NOT DETECTED Final   Staphylococcus aureus (BCID) NOT DETECTED  NOT DETECTED Final   Staphylococcus epidermidis NOT DETECTED NOT DETECTED Final   Staphylococcus lugdunensis NOT DETECTED NOT DETECTED Final   Streptococcus species NOT DETECTED NOT DETECTED Final   Streptococcus agalactiae NOT DETECTED NOT DETECTED Final   Streptococcus pneumoniae NOT DETECTED NOT DETECTED Final   Streptococcus pyogenes NOT DETECTED NOT DETECTED Final   A.calcoaceticus-baumannii NOT DETECTED NOT DETECTED Final   Bacteroides fragilis NOT DETECTED NOT DETECTED Final   Enterobacterales NOT DETECTED NOT DETECTED Final   Enterobacter cloacae complex NOT DETECTED NOT DETECTED Final   Escherichia coli NOT DETECTED NOT DETECTED Final   Klebsiella aerogenes NOT DETECTED NOT DETECTED Final   Klebsiella oxytoca NOT DETECTED NOT DETECTED Final   Klebsiella pneumoniae NOT DETECTED NOT DETECTED Final   Proteus species NOT DETECTED NOT DETECTED Final   Salmonella species NOT DETECTED NOT DETECTED Final   Serratia marcescens NOT DETECTED NOT DETECTED Final   Haemophilus influenzae NOT DETECTED NOT DETECTED Final   Neisseria  meningitidis NOT DETECTED NOT DETECTED Final   Pseudomonas aeruginosa DETECTED (A) NOT DETECTED Final    Comment: CRITICAL RESULT CALLED TO, READ BACK BY AND VERIFIED WITH: CRYSTAL REYNOLDS RN '@1625'$  09/02/20 EB    Stenotrophomonas maltophilia NOT DETECTED NOT DETECTED Final   Candida albicans NOT DETECTED NOT DETECTED Final   Candida auris NOT DETECTED NOT DETECTED Final   Candida glabrata NOT DETECTED NOT DETECTED Final   Candida krusei NOT DETECTED NOT DETECTED Final   Candida parapsilosis NOT DETECTED NOT DETECTED Final   Candida tropicalis NOT DETECTED NOT DETECTED Final   Cryptococcus neoformans/gattii NOT DETECTED NOT DETECTED Final   CTX-M ESBL NOT DETECTED NOT DETECTED Final   Carbapenem resistance IMP NOT DETECTED NOT DETECTED Final   Carbapenem resistance KPC NOT DETECTED NOT DETECTED Final   Carbapenem resistance NDM NOT DETECTED NOT DETECTED Final   Carbapenem resistance VIM NOT DETECTED NOT DETECTED Final    Comment: Performed at [facility], [address] [place], [place] [address]  Culture, blood (routine x 2)     Status: Abnormal   Collection Time: 09/01/20  3:49 PM   Specimen: BLOOD LEFT HAND  Result Value Ref Range Status   Specimen Description   Final    BLOOD LEFT HAND Performed at [facility], [address] [place], [place] [address]    Special Requests   Final    Blood Culture adequate volume BOTTLES DRAWN AEROBIC AND ANAEROBIC Performed at [facility], [address] [place], [place] [address]    Culture  Setup Time   Final    GRAM NEGATIVE RODS ANAEROBIC BOTTLE ONLY CRYSTAL REYNOLDS,RN '@1306'$  09/02/2020 KAY ANAEROBIC BOTTLE GRAM NEGATIVE RODS RESULT PREVIOUSLY CALLED Performed at [facility], [address] [place], [place] [address]    Culture (A)  Final    PSEUDOMONAS AERUGINOSA SUSCEPTIBILITIES PERFORMED ON PREVIOUS CULTURE WITHIN THE LAST 5 DAYS. Performed at [facility], [place] [address] [place], [place] [address]    Report  Status 09/04/2020 FINAL  Final  Culture, blood (Routine X 2) w Reflex to ID Panel     Status: None (Preliminary result)   Collection Time: 09/03/20 11:54 PM   Specimen: BLOOD LEFT FOREARM  Result Value Ref Range Status   Specimen Description BLOOD LEFT FOREARM  Final   Special Requests   Final    BOTTLES DRAWN AEROBIC AND ANAEROBIC Blood Culture adequate volume   Culture   Final    NO GROWTH 2 DAYS Performed at [facility], [address], [place], [place] [address]    Report Status PENDING  Incomplete  Culture, blood (Routine X 2) w Reflex to ID Panel     Status: None (Preliminary result)   Collection Time: 09/03/20 11:59 PM   Specimen: BLOOD LEFT HAND  Result Value Ref Range Status   Specimen Description BLOOD LEFT HAND  Final   Special Requests   Final    BOTTLES DRAWN AEROBIC AND ANAEROBIC Blood Culture adequate volume   Culture   Final    NO GROWTH 2 DAYS Performed at [facility], [address], [place], [place] [address]    Report Status PENDING  Incomplete     Scheduled Meds: . amLODipine  10 mg Oral Daily  . aspirin EC  81 mg Oral Daily  . atorvastatin  10 mg Oral q1800  . carvedilol  25 mg Oral BID  . Chlorhexidine Gluconate Cloth  6 each Topical Q0600  . Chlorhexidine Gluconate Cloth  6 each Topical Q0600  . Darbepoetin Alfa      . darbepoetin (ARANESP) injection - DIALYSIS  100 mcg Intravenous Once  . doxercalciferol      . doxercalciferol  5 mcg Intravenous Q T,Th,Sa-HD  . famotidine  20 mg Oral Daily  . gabapentin  100 mg Oral 2 times per day on Sun Mon Wed Fri   And  . gabapentin  100 mg Oral 3 times per day on Tue Thu Sat  . heparin  5,000 Units Subcutaneous Q8H  . heparin sodium (porcine)      . heparin sodium (porcine)  4,200 Units Intracatheter Once  . insulin aspart  0-5 Units Subcutaneous QHS  . insulin aspart  0-6 Units Subcutaneous TID WC  . insulin glargine  12 Units Subcutaneous QHS  . lidocaine-EPINEPHrine  20 mL Intradermal Once  .  lisinopril  5 mg Oral Daily  . senna-docusate  2 tablet Oral QHS  . sodium chloride flush  3 mL Intravenous Q12H  . sodium chloride flush  3 mL Intravenous Q12H  . tiZANidine  4 mg Oral Q8H   Continuous Infusions: . sodium chloride    . sodium chloride    . sodium chloride    . ceFEPime (MAXIPIME) IV    . vancomycin      Procedures/Studies: CT ABDOMEN PELVIS WO CONTRAST  Result Date: 09/02/2020 CLINICAL DATA:  Abdominal pain and fever EXAM: CT ABDOMEN AND PELVIS WITHOUT CONTRAST TECHNIQUE: Multidetector CT imaging of the abdomen and pelvis was performed following the standard protocol without IV contrast. COMPARISON:  04/08/2020 FINDINGS: Lower chest: Mild scarring is noted in the lung bases bilaterally. No sizable effusion is seen. Hepatobiliary: No focal liver abnormality is seen. No gallstones, gallbladder wall thickening, or biliary dilatation. Pancreas: Unremarkable. No pancreatic ductal dilatation or surrounding inflammatory changes. Spleen: Normal in size without focal abnormality. Adrenals/Urinary Tract: Adrenal glands are within normal limits. Kidneys demonstrate mild atrophy bilaterally. Perinephric stranding is noted similar to that seen on prior exams. Vascular calcifications are noted. No obstructive changes are seen. Bladder is decompressed. Stomach/Bowel: The appendix is well visualized and within normal limits. Colon shows no obstructive or inflammatory changes. Small bowel and stomach appear within normal limits. Vascular/Lymphatic: Aortic atherosclerosis. No enlarged abdominal or pelvic lymph nodes. Reproductive: Uterus and bilateral adnexa are unremarkable. Other: No abdominal wall hernia or abnormality. No abdominopelvic ascites. Musculoskeletal: Degenerative changes of lumbar spine are noted. IMPRESSION: Renal atrophy similar to that seen on the prior exam. No acute abnormality noted. Electronically Signed  By: Inez Catalina M.D.   On: 09/02/2020 15:47   IR US Guide Vasc  Access Left  Result Date: 09/06/2020 CLINICAL DATA:  History of end-stage renal disease. Previously placed right-sided tunneled hemodialysis catheter has been removed due to bacteremia. Bacteremia has been adequately treated and the patient now requires a new tunneled dialysis catheter. EXAM: TUNNELED CENTRAL VENOUS HEMODIALYSIS CATHETER PLACEMENT WITH ULTRASOUND AND FLUOROSCOPIC GUIDANCE ANESTHESIA/SEDATION: 1.0 mg IV Versed; 50 mcg IV Fentanyl. Total Moderate Sedation Time:   32 minutes. The patient's level of consciousness and physiologic status were continuously monitored during the procedure by Radiology nursing. MEDICATIONS: 1.5 g IV vancomycin. FLUOROSCOPY TIME:  42 seconds.  10.7 mGy. PROCEDURE: The procedure, risks, benefits, and alternatives were explained to the patient. Questions regarding the procedure were encouraged and answered. The patient understands and consents to the procedure. A timeout was performed prior to initiating the procedure. The left neck and chest were prepped with chlorhexidine in a sterile fashion, and a sterile drape was applied covering the operative field. Maximum barrier sterile technique with sterile gowns and gloves were used for the procedure. Local anesthesia was provided with 1% lidocaine. After creating a small venotomy incision, a 21 gauge needle was advanced into the left internal jugular vein under direct, real-time ultrasound guidance. Ultrasound image documentation was performed. After securing guidewire access, an 8 Fr dilator was placed. A J-wire was kinked to measure appropriate catheter length. A Palindrome tunneled hemodialysis catheter measuring 28 cm from tip to cuff was chosen for placement. This was tunneled in a retrograde fashion from the chest wall to the venotomy incision. At the venotomy, serial dilatation was performed and a 15 Fr peel-away sheath was placed over a guidewire. The catheter was then placed through the sheath and the sheath removed.  Final catheter positioning was confirmed and documented with a fluoroscopic spot image. The catheter was aspirated, flushed with saline, and injected with appropriate volume heparin dwells. The venotomy incision was closed with subcuticular 4-0 Vicryl. Dermabond was applied to the incision. The catheter exit site was secured with 0-Prolene retention sutures. COMPLICATIONS: None.  No pneumothorax. FINDINGS: After catheter placement, the tip lies in the right atrium. The catheter aspirates normally and is ready for immediate use. IMPRESSION: Placement of tunneled hemodialysis catheter via the left internal jugular vein. The catheter tip lies in the right atrium. The catheter is ready for immediate use. Electronically Signed   By: Aletta Edouard M.D.   On: 09/06/2020 16:12   DG Chest Port 1 View  Result Date: 09/03/2020 CLINICAL DATA:  Status post dialysis catheter removal EXAM: PORTABLE CHEST 1 VIEW COMPARISON:  09/01/2020 FINDINGS: Right jugular dialysis catheter has been removed in the interval. Cardiac shadow remains prominent. Lungs are well aerated bilaterally. No focal infiltrate or effusion is seen. No acute bony abnormality is noted. IMPRESSION: No acute abnormality noted. Electronically Signed   By: Inez Catalina M.D.   On: 09/03/2020 15:39   DG Chest Portable 1 View  Result Date: 09/01/2020 CLINICAL DATA:  Elevated temperature during dialysis today. Weakness and fever. EXAM: PORTABLE CHEST 1 VIEW COMPARISON:  08/28/2020 and older exams. FINDINGS: Cardiac silhouette normal in size.  No mediastinal or hilar masses. Lungs clear. Opacity noted at the lung bases on the recent prior study is no longer visualized. Lung bases are somewhat suboptimally visualized due to the significant overlying soft tissue. Cannot exclude a small effusion. No pneumothorax. Right anterior chest wall internal jugular tunneled central venous catheter is stable. IMPRESSION: 1.  No acute cardiopulmonary disease. Electronically  Signed   By: Lajean Manes M.D.   On: 09/01/2020 14:55   DG Chest Portable 1 View  Result Date: 08/28/2020 CLINICAL DATA:  50 year old female with respiratory failure. EXAM: PORTABLE CHEST 1 VIEW COMPARISON:  Chest radiograph dated 08/25/2020. FINDINGS: Right IJ dialysis catheter with tip over the right atrium. There is shallow inspiration. There is cardiomegaly with vascular congestion and edema. Significant worsening of CHF or fluid overload compared to the prior radiograph. Small bilateral pleural effusions and bibasilar atelectasis. Pneumonia is not excluded. No pneumothorax. No acute osseous pathology. IMPRESSION: Cardiomegaly with significant worsening of CHF or fluid overload compared to the prior radiograph. Electronically Signed   By: Anner Crete M.D.   On: 08/28/2020 16:23   DG Chest Port 1 View  Result Date: 08/25/2020 CLINICAL DATA:  Weakness. EXAM: PORTABLE CHEST 1 VIEW COMPARISON:  November 28, 2019 FINDINGS: A right-sided venous catheter is seen with its distal tip overlying the midportion of the superior vena cava. This is approximately 5.0 cm proximal to the junction of the superior vena cava and right atrium. There is no evidence of acute infiltrate, pleural effusion or pneumothorax. Stable moderate to marked severity enlargement of the cardiac silhouette is seen. The visualized skeletal structures are unremarkable. IMPRESSION: Right-sided venous catheter positioning, as described above. Electronically Signed   By: Virgina [place] M.D.   On: 08/25/2020 15:43   IR TUNNELED CENTRAL VENOUS CATHETER PLACEMENT  Result Date: 09/06/2020 CLINICAL DATA:  History of end-stage renal disease. Previously placed right-sided tunneled hemodialysis catheter has been removed due to bacteremia. Bacteremia has been adequately treated and the patient now requires a new tunneled dialysis catheter. EXAM: TUNNELED CENTRAL VENOUS HEMODIALYSIS CATHETER PLACEMENT WITH ULTRASOUND AND FLUOROSCOPIC GUIDANCE  ANESTHESIA/SEDATION: 1.0 mg IV Versed; 50 mcg IV Fentanyl. Total Moderate Sedation Time:   32 minutes. The patient's level of consciousness and physiologic status were continuously monitored during the procedure by Radiology nursing. MEDICATIONS: 1.5 g IV vancomycin. FLUOROSCOPY TIME:  42 seconds.  10.7 mGy. PROCEDURE: The procedure, risks, benefits, and alternatives were explained to the patient. Questions regarding the procedure were encouraged and answered. The patient understands and consents to the procedure. A timeout was performed prior to initiating the procedure. The left neck and chest were prepped with chlorhexidine in a sterile fashion, and a sterile drape was applied covering the operative field. Maximum barrier sterile technique with sterile gowns and gloves were used for the procedure. Local anesthesia was provided with 1% lidocaine. After creating a small venotomy incision, a 21 gauge needle was advanced into the left internal jugular vein under direct, real-time ultrasound guidance. Ultrasound image documentation was performed. After securing guidewire access, an 8 Fr dilator was placed. A J-wire was kinked to measure appropriate catheter length. A Palindrome tunneled hemodialysis catheter measuring 28 cm from tip to cuff was chosen for placement. This was tunneled in a retrograde fashion from the chest wall to the venotomy incision. At the venotomy, serial dilatation was performed and a 15 Fr peel-away sheath was placed over a guidewire. The catheter was then placed through the sheath and the sheath removed. Final catheter positioning was confirmed and documented with a fluoroscopic spot image. The catheter was aspirated, flushed with saline, and injected with appropriate volume heparin dwells. The venotomy incision was closed with subcuticular 4-0 Vicryl. Dermabond was applied to the incision. The catheter exit site was secured with 0-Prolene retention sutures. COMPLICATIONS: None.  No  pneumothorax. FINDINGS: After catheter placement, the  tip lies in the right atrium. The catheter aspirates normally and is ready for immediate use. IMPRESSION: Placement of tunneled hemodialysis catheter via the left internal jugular vein. The catheter tip lies in the right atrium. The catheter is ready for immediate use. Electronically Signed   By: Aletta Edouard M.D.   On: 09/06/2020 16:12    Orson Eva, DO  [facility]  If 7PM-7AM, please contact night-coverage [email or web address] Password [email or web address] 09/06/2020, 5:55 PM   LOS: 4 days

## 2020-09-06 NOTE — Progress Notes (Signed)
[facility] ROUNDING NOTE   Subjective:   Brief history: 50 year old lady I diabetes hypertension stage renal disease Tuesday Thursday Saturday dialysis.  Admitted with fever cultures positive for Pseudomonas bacteremia probably related to catheter related infection.  HD catheter was removed 09/03/2020.  Interventional radiology to place catheter 09/06/2020.  Blood pressure 189/88 pulse 68 temperature 97.6 O2 sats 90% 3 L nasal cannula  There were no labs 09/05/2020 or 09/06/2020.  It may be a good idea to check daily labs.  Outpatient HD: [name], TTS, EDW 167.5 kg, 1K/2.5 Ca,Heparin 2600 units bolus then 600 unit hourly,4 hours,BF 400,DF 500,RIJ catheter Hectoral 15mg IV/HD Epogen 7000Units IV/HD  Othercinacalcet 30 mg every HD   Objective:  Vital signs in last 24 hours:  Temp:  [97.6 F (36.4 C)-97.9 F (36.6 C)] 97.6 F (36.4 C) (06/01 2038) Pulse Rate:  [57-68] 68 (06/02 0648) Resp:  [18] 18 (06/02 0648) BP: (141-189)/(57-96) 189/88 (06/02 0648) SpO2:  [100 %] 100 % (06/02 0648)  Weight change:  Filed Weights   09/03/20 0500 09/03/20 0700 09/05/20 0616  Weight: (!) 164.6 kg (!) 164.6 kg (!) 163.7 kg    Intake/Output: I/O last 3 completed shifts: In: 1670.9 [P.O.:1574; IV Piggyback:96.9] Out: -    Intake/Output this shift:  Total I/O In: 280 [P.O.:280] Out: -   General:NAD, comfortable Heart:RRR, s1s2 nl Lungs: Clear b/l, no crackle Abdomen:soft, Non-tender, non-distended Extremities: Trace LE edema  Dialysis Access: None at present   Basic Metabolic Panel: Recent Labs  Lab 08/31/20 0714 09/01/20 1500 09/02/20 0617 09/03/20 0914  NA 132* 131* 132* 129*  K 3.9 3.9 3.3* 3.5  CL 94* 96* 93* 91*  CO2 '28 24 23 25  '$ GLUCOSE 192* 179* 144* 239*  BUN 27* 51* 30* 54*  CREATININE 7.44* 11.23* 8.01* 11.37*  CALCIUM 8.3* 8.0* 8.1* 7.8*  MG 2.1  --   --   --   PHOS 5.1*  --   --  5.9*    Liver Function Tests: Recent Labs  Lab  09/01/20 1500 09/02/20 0617 09/03/20 0914  AST 14* 25  --   ALT 17 23  --   ALKPHOS 49 52  --   BILITOT 0.7 0.9  --   PROT 7.4 7.5  --   ALBUMIN 3.5 3.6 3.4*   Recent Labs  Lab 09/01/20 1500  LIPASE 35   No results for input(s): AMMONIA in the last 168 hours.  CBC: Recent Labs  Lab 08/31/20 0714 09/01/20 1500 09/02/20 0617 09/03/20 0914  WBC 10.9* 12.0* 14.9* 10.0  NEUTROABS  --  10.0*  --   --   HGB 9.6* 9.2* 9.2* 8.9*  HCT 30.7* 29.8* 30.2* 28.6*  MCV 100.3* 103.5* 103.1* 102.1*  PLT 161 158 153 158    Cardiac Enzymes: No results for input(s): CKTOTAL, CKMB, CKMBINDEX, TROPONINI in the last 168 hours.  BNP: Invalid input(s): POCBNP  CBG: Recent Labs  Lab 09/04/20 2115 09/05/20 0742 09/05/20 1150 09/05/20 1718 09/05/20 2205  GLUCAP 177* 181* 173* 175* 217*    Microbiology: Results for orders placed or performed during the hospital encounter of 09/01/20  Resp Panel by RT-PCR (Flu A&B, Covid) Nasopharyngeal Swab     Status: None   Collection Time: 09/01/20  1:45 PM   Specimen: Nasopharyngeal Swab; Nasopharyngeal(NP) swabs in vial transport medium  Result Value Ref Range Status   SARS Coronavirus 2 by RT PCR NEGATIVE NEGATIVE Final    Comment: (NOTE) SARS-CoV-2 target nucleic acids are NOT  DETECTED.  The SARS-CoV-2 RNA is generally detectable in upper respiratory specimens during the acute phase of infection. The lowest concentration of SARS-CoV-2 viral copies this assay can detect is 138 copies/mL. A negative result does not preclude SARS-Cov-2 infection and should not be used as the sole basis for treatment or other patient management decisions. A negative result may occur with  improper specimen collection/handling, submission of specimen other than nasopharyngeal swab, presence of viral mutation(s) within the areas targeted by this assay, and inadequate number of viral copies(<138 copies/mL). A negative result must be combined with clinical  observations, patient history, and epidemiological information. The expected result is Negative.  Fact Sheet for Patients:  EntrepreneurPulse.com.au  Fact Sheet for Healthcare Providers:  IncredibleEmployment.be  This test is no t yet approved or cleared by the [place] FDA and  has been authorized for detection and/or diagnosis of SARS-CoV-2 by FDA under an Emergency Use Authorization (EUA). This EUA will remain  in effect (meaning this test can be used) for the duration of the COVID-19 declaration under Section 564(b)(1) of the Act, 21 U.S.C.section 360bbb-3(b)(1), unless the authorization is terminated  or revoked sooner.       Influenza A by PCR NEGATIVE NEGATIVE Final   Influenza B by PCR NEGATIVE NEGATIVE Final    Comment: (NOTE) The Xpert Xpress SARS-CoV-2/FLU/RSV plus assay is intended as an aid in the diagnosis of influenza from Nasopharyngeal swab specimens and should not be used as a sole basis for treatment. Nasal washings and aspirates are unacceptable for Xpert Xpress SARS-CoV-2/FLU/RSV testing.  Fact Sheet for Patients: EntrepreneurPulse.com.au  Fact Sheet for Healthcare Providers: IncredibleEmployment.be  This test is not yet approved or cleared by the [place] FDA and has been authorized for detection and/or diagnosis of SARS-CoV-2 by FDA under an Emergency Use Authorization (EUA). This EUA will remain in effect (meaning this test can be used) for the duration of the COVID-19 declaration under Section 564(b)(1) of the Act, 21 U.S.C. section 360bbb-3(b)(1), unless the authorization is terminated or revoked.  Performed at [facility], [address], [place], [place] [address]   Culture, blood (routine x 2)     Status: Abnormal   Collection Time: 09/01/20  3:00 PM   Specimen: Right Antecubital; Blood  Result Value Ref Range Status   Specimen Description   Final    RIGHT  ANTECUBITAL Performed at [facility], [address] [place], [place] [address]    Special Requests   Final    Blood Culture results may not be optimal due to an inadequate volume of blood received in culture bottles BOTTLES DRAWN AEROBIC AND ANAEROBIC Performed at [facility], [address] [place], [place] [address]    Culture  Setup Time   Final    GRAM NEGATIVE RODS Gram Stain Report Called to,Read Back By and Verified With: RACHEAL TATE,RN '@1154'$  09/02/2020 KAY AEROBIC BOTTLE ONLY CRITICAL RESULT CALLED TO, READ BACK BY AND VERIFIED WITH: CRYSTAL REYNOLDS RN '@1625'$  09/02/20 EB Performed at [facility] [address] [place], [place] [address]    Culture PSEUDOMONAS AERUGINOSA (A)  Final   Report Status 09/04/2020 FINAL  Final   Organism ID, Bacteria PSEUDOMONAS AERUGINOSA  Final      Susceptibility   Pseudomonas aeruginosa - MIC*    CEFTAZIDIME <=1 SENSITIVE Sensitive     CIPROFLOXACIN <=0.25 SENSITIVE Sensitive     GENTAMICIN <=1 SENSITIVE Sensitive     IMIPENEM 1 SENSITIVE Sensitive     PIP/TAZO <=4  SENSITIVE Sensitive     CEFEPIME 1 SENSITIVE Sensitive     * PSEUDOMONAS AERUGINOSA  Blood Culture ID Panel (Reflexed)     Status: Abnormal   Collection Time: 09/01/20  3:00 PM  Result Value Ref Range Status   Enterococcus faecalis NOT DETECTED NOT DETECTED Final   Enterococcus Faecium NOT DETECTED NOT DETECTED Final   Listeria monocytogenes NOT DETECTED NOT DETECTED Final   Staphylococcus species NOT DETECTED NOT DETECTED Final   Staphylococcus aureus (BCID) NOT DETECTED NOT DETECTED Final   Staphylococcus epidermidis NOT DETECTED NOT DETECTED Final   Staphylococcus lugdunensis NOT DETECTED NOT DETECTED Final   Streptococcus species NOT DETECTED NOT DETECTED Final   Streptococcus agalactiae NOT DETECTED NOT DETECTED Final   Streptococcus pneumoniae NOT DETECTED NOT DETECTED Final   Streptococcus pyogenes NOT DETECTED NOT DETECTED Final   A.calcoaceticus-baumannii  NOT DETECTED NOT DETECTED Final   Bacteroides fragilis NOT DETECTED NOT DETECTED Final   Enterobacterales NOT DETECTED NOT DETECTED Final   Enterobacter cloacae complex NOT DETECTED NOT DETECTED Final   Escherichia coli NOT DETECTED NOT DETECTED Final   Klebsiella aerogenes NOT DETECTED NOT DETECTED Final   Klebsiella oxytoca NOT DETECTED NOT DETECTED Final   Klebsiella pneumoniae NOT DETECTED NOT DETECTED Final   Proteus species NOT DETECTED NOT DETECTED Final   Salmonella species NOT DETECTED NOT DETECTED Final   Serratia marcescens NOT DETECTED NOT DETECTED Final   Haemophilus influenzae NOT DETECTED NOT DETECTED Final   Neisseria meningitidis NOT DETECTED NOT DETECTED Final   Pseudomonas aeruginosa DETECTED (A) NOT DETECTED Final    Comment: CRITICAL RESULT CALLED TO, READ BACK BY AND VERIFIED WITH: CRYSTAL REYNOLDS RN '@1625'$  09/02/20 EB    Stenotrophomonas maltophilia NOT DETECTED NOT DETECTED Final   Candida albicans NOT DETECTED NOT DETECTED Final   Candida auris NOT DETECTED NOT DETECTED Final   Candida glabrata NOT DETECTED NOT DETECTED Final   Candida krusei NOT DETECTED NOT DETECTED Final   Candida parapsilosis NOT DETECTED NOT DETECTED Final   Candida tropicalis NOT DETECTED NOT DETECTED Final   Cryptococcus neoformans/gattii NOT DETECTED NOT DETECTED Final   CTX-M ESBL NOT DETECTED NOT DETECTED Final   Carbapenem resistance IMP NOT DETECTED NOT DETECTED Final   Carbapenem resistance KPC NOT DETECTED NOT DETECTED Final   Carbapenem resistance NDM NOT DETECTED NOT DETECTED Final   Carbapenem resistance VIM NOT DETECTED NOT DETECTED Final    Comment: Performed at [facility], [address], [place], [place] [address]  Culture, blood (routine x 2)     Status: Abnormal   Collection Time: 09/01/20  3:49 PM   Specimen: BLOOD LEFT HAND  Result Value Ref Range Status   Specimen Description   Final    BLOOD LEFT HAND Performed at [facility], [address],  [place], [place] [address]    Special Requests   Final    Blood Culture adequate volume BOTTLES DRAWN AEROBIC AND ANAEROBIC Performed at [facility], [address] [place], [place] [address]    Culture  Setup Time   Final    GRAM NEGATIVE RODS ANAEROBIC BOTTLE ONLY CRYSTAL REYNOLDS,RN '@1306'$  09/02/2020 KAY ANAEROBIC BOTTLE GRAM NEGATIVE RODS RESULT PREVIOUSLY CALLED Performed at [facility], [address], [place], [place] [address]    Culture (A)  Final    PSEUDOMONAS AERUGINOSA SUSCEPTIBILITIES PERFORMED ON PREVIOUS CULTURE WITHIN THE LAST 5 DAYS. Performed at [facility], [place] [address], [place], [place] [address]    Report Status 09/04/2020  FINAL  Final  Culture, blood (Routine X 2) w Reflex to ID Panel     Status: None (Preliminary result)   Collection Time: 09/03/20 11:54 PM   Specimen: BLOOD LEFT FOREARM  Result Value Ref Range Status   Specimen Description BLOOD LEFT FOREARM  Final   Special Requests   Final    BOTTLES DRAWN AEROBIC AND ANAEROBIC Blood Culture adequate volume   Culture   Final    NO GROWTH 1 DAY Performed at [facility], [address], [place], [place] [address]    Report Status PENDING  Incomplete  Culture, blood (Routine X 2) w Reflex to ID Panel     Status: None (Preliminary result)   Collection Time: 09/03/20 11:59 PM   Specimen: BLOOD LEFT HAND  Result Value Ref Range Status   Specimen Description BLOOD LEFT HAND  Final   Special Requests   Final    BOTTLES DRAWN AEROBIC AND ANAEROBIC Blood Culture adequate volume   Culture   Final    NO GROWTH 1 DAY Performed at [facility], [address], [place], [place] [address]    Report Status PENDING  Incomplete    Coagulation Studies: No results for input(s): LABPROT, INR in the last 72 hours.  Urinalysis: No results for input(s): COLORURINE, LABSPEC, PHURINE, GLUCOSEU, HGBUR, BILIRUBINUR, KETONESUR, PROTEINUR, UROBILINOGEN, NITRITE, LEUKOCYTESUR in the last 72 hours.  Invalid input(s):  APPERANCEUR    Imaging: No results found.   Medications:   . sodium chloride    . sodium chloride    . sodium chloride    . ceFEPime (MAXIPIME) IV 1 g (09/05/20 1652)  . vancomycin     . amLODipine  10 mg Oral Daily  . aspirin EC  81 mg Oral Daily  . atorvastatin  10 mg Oral q1800  . carvedilol  25 mg Oral BID  . Chlorhexidine Gluconate Cloth  6 each Topical Q0600  . Chlorhexidine Gluconate Cloth  6 each Topical Q0600  . darbepoetin (ARANESP) injection - DIALYSIS  100 mcg Intravenous Once  . doxercalciferol  5 mcg Intravenous Q T,Th,Sa-HD  . famotidine  20 mg Oral Daily  . gabapentin  100 mg Oral 2 times per day on Sun Mon Wed Fri   And  . gabapentin  100 mg Oral 3 times per day on Tue Thu Sat  . heparin  5,000 Units Subcutaneous Q8H  . insulin aspart  0-5 Units Subcutaneous QHS  . insulin aspart  0-6 Units Subcutaneous TID WC  . insulin glargine  12 Units Subcutaneous QHS  . lidocaine-EPINEPHrine  20 mL Intradermal Once  . lisinopril  5 mg Oral Daily  . senna-docusate  2 tablet Oral QHS  . sodium chloride flush  3 mL Intravenous Q12H  . sodium chloride flush  3 mL Intravenous Q12H  . tiZANidine  4 mg Oral Q8H   sodium chloride, sodium chloride, sodium chloride, acetaminophen **OR** acetaminophen, alteplase, bisacodyl, diphenhydrAMINE, heparin, labetalol, lidocaine-prilocaine, nitroGLYCERIN, ondansetron **OR** ondansetron (ZOFRAN) IV, polyethylene glycol, sodium chloride flush, traZODone  Assessment/ Plan:  #Acute febrile illness due to Pseudomonas bacteremia related with dialysis catheter.  Currently on IV cefepime with dialysis.  The HD catheter was removed on 5/30 after dialysis.  The culture was repeated.  If the culture is negative in 48 hours then I think it is safe to place another HD catheter.  Patient to go to interventional radiology [facility] 09/06/2020  # ESRD TTS: Received HD on 5/30 off schedule because of need  need to remove HD catheter.  She is  clinically stable.  Plan for next HD on 09/06/2020  # Anemia: Monitor hemoglobin.  Continue ESA.  No iron because of bacteremia.  # Secondary hyperparathyroidism: Monitor calcium and phosphorus level.    Sensipar on hold at this point  # HTN/volume: BP remains elevated.  Continue amlodipine and carvedilol.  Resumed lisinopril (home medication).  May be able to uptitrate the dose.  #Hyponatremia, hypervolemic: Managed with dialysis.  Fluid restriction.  Monitor lab.   LOS: Mack '@TODAY''@6'$ :53 AM

## 2020-09-06 NOTE — Consult Note (Signed)
Chief Complaint: Patient was seen in consultation today for renal failure, bacteremia  Referring Physician(s): Notus  Supervising Physician: Aletta Edouard  Patient Status: [facility] - In-pt  History of Present Illness: Courtney Gray is a 50 y.o. female with history of DM, HTN, chronic renal disease s/p AV fistula creation x2 which are non-functional who is HD dependent via HD catheter placement.  She recently underwent placement 08/28/20, however returned to the ED 09/01/20 with fever and chills.  She was found to have pseudomonas bacteremia and her tunneled dialysis was removed.  She is now in need of replacement.  She has been on several days on IV abx, and her repeat blood cultures have been negative for 48 hrs.   Patient presents to [facility] Radiology today in her usual state of health.  She is feeling better.  She has not noticed any drainage from her prior catheter site.  In fact, she reports she never had any tenderness, erythema, drainage, or pus related to the catheter. She has a L neck IV in place as her only IV access currently.   Past Medical History:  Diagnosis Date  . Diabetes mellitus without complication ([place])   . High cholesterol   . Hypertension   . Renal disorder     Past Surgical History:  Procedure Laterality Date  . AV FISTULA PLACEMENT     x2, unsuccessful.   . IR FLUORO GUIDE CV LINE RIGHT  08/28/2020    Allergies: Benzonatate, Hydralazine, Amoxicillin, Clonidine, Morphine, and Oxycodone  Medications: Prior to Admission medications   Medication Sig Start Date End Date Taking? Authorizing Provider  acetaminophen (TYLENOL) 500 MG tablet Take 1,000 mg by mouth every 6 (six) hours as needed for moderate pain or headache.    [provider]  amLODipine (NORVASC) 10 MG tablet Take 10 mg by mouth See admin instructions. Every other day (on dialysis days) if blood pressure is in the 200's. 03/27/18   [provider]  aspirin EC 81 MG tablet  Take 81 mg by mouth daily.    [provider]  atorvastatin (LIPITOR) 10 MG tablet Take 10 mg by mouth daily. 03/29/19   [provider]  carvedilol (COREG) 25 MG tablet Take 25 mg by mouth 2 (two) times daily. 04/20/19   [provider]  cinacalcet (SENSIPAR) 30 MG tablet Take 1 tablet (30 mg total) by mouth Every Tuesday,Thursday,and Saturday with dialysis. 09/01/20 11/30/20  Enzo Bi, MD  diphenhydrAMINE (BENADRYL) 25 MG tablet Take 50 mg by mouth daily as needed for allergies.    [provider]  docusate sodium (COLACE) 100 MG capsule Take 100 mg by mouth daily.    [provider]  doxercalciferol (HECTOROL) 4 MCG/2ML injection Inject 2.5 mLs (5 mcg total) into the vein Every Tuesday,Thursday,and Saturday with dialysis. 09/01/20   Enzo Bi, MD  famotidine (PEPCID) 20 MG tablet Take 20 mg by mouth daily as needed for heartburn or indigestion. Patient not taking: Reported on 09/01/2020    [provider]  gabapentin (NEURONTIN) 100 MG capsule Take 100-200 mg by mouth See admin instructions. Take 100 mg twice daily on non dialysis days.  Take 100 mg at 9am before dialysis, '100mg'$  after dialysis at 5pm, and '100mg'$  at 1am on Tuesdays, Thursdays,and Saturdays. 04/20/19   [provider]  LANTUS SOLOSTAR 100 UNIT/ML Solostar Pen Inject 12 Units into the skin at bedtime.  03/18/19   [provider]  lidocaine-prilocaine (EMLA) cream Apply 1 application topically as needed (port  access). Patient not taking: Reported on 09/01/2020    [provider]  lisinopril (ZESTRIL) 5 MG tablet Take 5 mg by mouth daily. 11/11/19   [provider]  nitroGLYCERIN (NITROSTAT) 0.4 MG SL tablet Place 0.4 mg under the tongue every 5 (five) minutes as needed for chest pain.    [provider]  Simethicone (GAS-X PO) Take 1 tablet by mouth daily as needed (gas).    [provider]  sodium chloride (OCEAN) 0.65 % SOLN nasal  spray Place 1 spray into both nostrils in the morning and at bedtime.    [provider]  tiZANidine (ZANAFLEX) 4 MG tablet Take 4 mg by mouth every 8 (eight) hours.  04/20/19   [provider]     Family History  Problem Relation Age of Onset  . Diabetes Mother   . Diabetes Father     Social History   Socioeconomic History  . Marital status: Single    Spouse name: Not on file  . Number of children: Not on file  . Years of education: Not on file  . Highest education level: Not on file  Occupational History  . Not on file  Tobacco Use  . Smoking status: Never Smoker  . Smokeless tobacco: Never Used  Vaping Use  . Vaping Use: Never used  Substance and Sexual Activity  . Alcohol use: Never  . Drug use: Never  . Sexual activity: Not on file  Other Topics Concern  . Not on file  Social History Narrative  . Not on file   Social Determinants of Health   Financial Resource Strain: Not on file  Food Insecurity: Not on file  Transportation Needs: Not on file  Physical Activity: Not on file  Stress: Not on file  Social Connections: Not on file     Review of Systems: A 12 point ROS discussed and pertinent positives are indicated in the HPI above.  All other systems are negative.  Review of Systems  Constitutional: Negative for fatigue and fever.  Respiratory: Negative for cough and shortness of breath.   Cardiovascular: Negative for chest pain.  Gastrointestinal: Negative for abdominal pain.  Musculoskeletal: Negative for back pain.  Psychiatric/Behavioral: Negative for behavioral problems and confusion.    Vital Signs: There were no vitals taken for this visit.  Physical Exam Vitals and nursing note reviewed.  Constitutional:      Appearance: Normal appearance.  HENT:     Mouth/Throat:     Mouth: Mucous membranes are moist.     Pharynx: Oropharynx is clear.  Neck:     Comments: Dressing in place from recent removal. Clean and dry.  L neck IV  in place.  Cardiovascular:     Rate and Rhythm: Normal rate and regular rhythm.  Pulmonary:     Effort: Pulmonary effort is normal. No respiratory distress.     Breath sounds: Normal breath sounds.  Musculoskeletal:     Cervical back: Normal range of motion and neck supple.  Skin:    General: Skin is warm and dry.  Neurological:     General: No focal deficit present.     Mental Status: She is alert and oriented to person, place, and time. Mental status is at baseline.  Psychiatric:        Mood and Affect: Mood normal.        Behavior: Behavior normal.        Thought Content: Thought content normal.  Judgment: Judgment normal.      MD Evaluation Airway: WNL Heart: WNL Abdomen: WNL Chest/ Lungs: WNL ASA  Classification: 3 Mallampati/Airway Score: Two   Imaging: CT ABDOMEN PELVIS WO CONTRAST  Result Date: 09/02/2020 CLINICAL DATA:  Abdominal pain and fever EXAM: CT ABDOMEN AND PELVIS WITHOUT CONTRAST TECHNIQUE: Multidetector CT imaging of the abdomen and pelvis was performed following the standard protocol without IV contrast. COMPARISON:  04/08/2020 FINDINGS: Lower chest: Mild scarring is noted in the lung bases bilaterally. No sizable effusion is seen. Hepatobiliary: No focal liver abnormality is seen. No gallstones, gallbladder wall thickening, or biliary dilatation. Pancreas: Unremarkable. No pancreatic ductal dilatation or surrounding inflammatory changes. Spleen: Normal in size without focal abnormality. Adrenals/Urinary Tract: Adrenal glands are within normal limits. Kidneys demonstrate mild atrophy bilaterally. Perinephric stranding is noted similar to that seen on prior exams. Vascular calcifications are noted. No obstructive changes are seen. Bladder is decompressed. Stomach/Bowel: The appendix is well visualized and within normal limits. Colon shows no obstructive or inflammatory changes. Small bowel and stomach appear within normal limits. Vascular/Lymphatic: Aortic  atherosclerosis. No enlarged abdominal or pelvic lymph nodes. Reproductive: Uterus and bilateral adnexa are unremarkable. Other: No abdominal wall hernia or abnormality. No abdominopelvic ascites. Musculoskeletal: Degenerative changes of lumbar spine are noted. IMPRESSION: Renal atrophy similar to that seen on the prior exam. No acute abnormality noted. Electronically Signed   By: Inez Catalina M.D.   On: 09/02/2020 15:47   DG Chest Port 1 View  Result Date: 09/03/2020 CLINICAL DATA:  Status post dialysis catheter removal EXAM: PORTABLE CHEST 1 VIEW COMPARISON:  09/01/2020 FINDINGS: Right jugular dialysis catheter has been removed in the interval. Cardiac shadow remains prominent. Lungs are well aerated bilaterally. No focal infiltrate or effusion is seen. No acute bony abnormality is noted. IMPRESSION: No acute abnormality noted. Electronically Signed   By: Inez Catalina M.D.   On: 09/03/2020 15:39   DG Chest Portable 1 View  Result Date: 09/01/2020 CLINICAL DATA:  Elevated temperature during dialysis today. Weakness and fever. EXAM: PORTABLE CHEST 1 VIEW COMPARISON:  08/28/2020 and older exams. FINDINGS: Cardiac silhouette normal in size.  No mediastinal or hilar masses. Lungs clear. Opacity noted at the lung bases on the recent prior study is no longer visualized. Lung bases are somewhat suboptimally visualized due to the significant overlying soft tissue. Cannot exclude a small effusion. No pneumothorax. Right anterior chest wall internal jugular tunneled central venous catheter is stable. IMPRESSION: 1. No acute cardiopulmonary disease. Electronically Signed   By: Lajean Manes M.D.   On: 09/01/2020 14:55   DG Chest Portable 1 View  Result Date: 08/28/2020 CLINICAL DATA:  50 year old female with respiratory failure. EXAM: PORTABLE CHEST 1 VIEW COMPARISON:  Chest radiograph dated 08/25/2020. FINDINGS: Right IJ dialysis catheter with tip over the right atrium. There is shallow inspiration. There is  cardiomegaly with vascular congestion and edema. Significant worsening of CHF or fluid overload compared to the prior radiograph. Small bilateral pleural effusions and bibasilar atelectasis. Pneumonia is not excluded. No pneumothorax. No acute osseous pathology. IMPRESSION: Cardiomegaly with significant worsening of CHF or fluid overload compared to the prior radiograph. Electronically Signed   By: Anner Crete M.D.   On: 08/28/2020 16:23   DG Chest Port 1 View  Result Date: 08/25/2020 CLINICAL DATA:  Weakness. EXAM: PORTABLE CHEST 1 VIEW COMPARISON:  November 28, 2019 FINDINGS: A right-sided venous catheter is seen with its distal tip overlying the midportion of the superior vena cava. This is approximately  5.0 cm proximal to the junction of the superior vena cava and right atrium. There is no evidence of acute infiltrate, pleural effusion or pneumothorax. Stable moderate to marked severity enlargement of the cardiac silhouette is seen. The visualized skeletal structures are unremarkable. IMPRESSION: Right-sided venous catheter positioning, as described above. Electronically Signed   By: Virgina Norfolk M.D.   On: 08/25/2020 15:43    Labs:  CBC: Recent Labs    08/31/20 0714 09/01/20 1500 09/02/20 0617 09/03/20 0914  WBC 10.9* 12.0* 14.9* 10.0  HGB 9.6* 9.2* 9.2* 8.9*  HCT 30.7* 29.8* 30.2* 28.6*  PLT 161 158 153 158    COAGS: No results for input(s): INR, APTT in the last 8760 hours.  BMP: Recent Labs    11/28/19 1149 11/29/19 0120 01/24/20 1733 08/31/20 0714 09/01/20 1500 09/02/20 0617 09/03/20 0914  NA 138 135   < > 132* 131* 132* 129*  K 5.1 3.5   < > 3.9 3.9 3.3* 3.5  CL 100 97*   < > 94* 96* 93* 91*  CO2 23 24   < > '28 24 23 25  '$ GLUCOSE 128* 158*   < > 192* 179* 144* 239*  BUN 57* 32*   < > 27* 51* 30* 54*  CALCIUM 8.1* 8.3*   < > 8.3* 8.0* 8.1* 7.8*  CREATININE 12.20* 7.93*   < > 7.44* 11.23* 8.01* 11.37*  GFRNONAA 3* 5*   < > 6* 4* 6* 4*  GFRAA 4* 6*  --   --    --   --   --    < > = values in this interval not displayed.    LIVER FUNCTION TESTS: Recent Labs    04/08/20 1246 08/28/20 1538 08/29/20 1948 09/01/20 1500 09/02/20 0617 09/03/20 0914  BILITOT 0.7 0.9  --  0.7 0.9  --   AST 13* 19  --  14* 25  --   ALT 14 20  --  17 23  --   ALKPHOS 73 58  --  49 52  --   PROT 8.2* 7.5  --  7.4 7.5  --   ALBUMIN 4.1 3.9 3.3* 3.5 3.6 3.4*    TUMOR MARKERS: No results for input(s): AFPTM, CEA, CA199, CHROMGRNA in the last 8760 hours.  Assessment and Plan: Renal Failure, HD dependent Bacteremia Patient on HD via tunneled HD catheters.  Recent s/p placement 08/28/20, then admitted with fever, chills, Pseudomonas bacteremia.  Her fever curve and WBC have improved.  She is feeling better after several days of IV abx.  Repeat cultures have thus far been negative. She is in need of access for HD.  Plan made for replacement of tunneled HD catheter today in IR.  Patients from [facility] with plans to return to unit.  She has been NPO.  She is agreeable to proceed.   Risks and benefits discussed with the patient including, but not limited to bleeding, infection, vascular injury, pneumothorax which may require chest tube placement, air embolism or even death  All of the patient's questions were answered, patient is agreeable to proceed. Consent signed and in chart.  Thank you for this interesting consult.  I greatly enjoyed meeting Courtney Gray and look forward to participating in their care.  A copy of this report was sent to the requesting provider on this date.  Electronically Signed: Docia Barrier, PA 09/06/2020, 12:25 PM   I spent a total of 40 Minutes    in face to face in  clinical consultation, greater than 50% of which was counseling/coordinating care for renal failure.

## 2020-09-06 NOTE — Plan of Care (Signed)

## 2020-09-06 NOTE — Procedures (Signed)
Interventional Radiology Procedure Note  Procedure: Tunneled HD catheter placement  Complications: None  Estimated Blood Loss: < 10 mL  Findings: 28 cm tip to cuff length Palindrome catheter placed via left IJ vein. Tip in RA. OK to use.  Venetia Night. Kathlene Cote, M.D Pager:  [phone]

## 2020-09-06 NOTE — Sedation Documentation (Signed)
[facility] at bedside to transport pt. Disconnected from unit monitor. Pt transferred to [place] independently. Tolerated well. No s/s of distress at this time.

## 2020-09-06 NOTE — Progress Notes (Signed)
PRN Labetalol effective SBP below 170 at this time  09/06/20 1716  Vitals  BP (!) 167/75  Pulse Rate 60  Resp 16  MEWS COLOR  MEWS Score Color Green  MEWS Score  MEWS Temp 0  MEWS Systolic 0  MEWS Pulse 0  MEWS RR 0  MEWS LOC 0  MEWS Score 0

## 2020-09-06 NOTE — TOC Initial Note (Addendum)
Transition of Care [facility]) - Initial/Assessment Note    Patient Details  Name: Courtney Courtney MRN: [id number] Date of Birth: 1970/06/01  [facility] CM/SW Contact:    Ihor Gully, LCSW Phone Number: 09/06/2020, 1:34 PM  Clinical Narrative:                 Patient from home with adult daughter. At baseline, patient ambulates independently. When she feels weakened she uses a cane. Patient is on HD at [facility]. Spoke with Joy with [facility] and discussed that patient will need IV abx at each HD session. TOC to send IV abx order when it is ready.  Patient on home oxygen through [place]. Patient states that she will need a portable delivered to her room at d/c by [employer].  Patient uses her [facility] transportation to go to appointments and HD.   Expected Discharge Plan: Home/Self Care Barriers to Discharge: Continued Medical Work up   Patient Goals and CMS Choice Patient states their goals for this hospitalization and ongoing recovery are:: return home      Expected Discharge Plan and Services Expected Discharge Plan: Home/Self Care     Post Acute Care Choice: Dialysis Living arrangements for the past 2 months: Single Family Home                                      Prior Living Arrangements/Services Living arrangements for the past 2 months: Single Family Home Lives with:: Adult Children Patient language and need for interpreter reviewed:: Yes Do you feel safe going back to the place where you live?: Yes      Need for Family Participation in Patient Care: Yes (Comment) Care giver support system in place?: Yes (comment) Current home services: DME Criminal Activity/Legal Involvement Pertinent to Current Situation/Hospitalization: No - Comment as needed  Activities of Daily Living Home Assistive Devices/Equipment: CBG Meter ADL Screening (condition at time of admission) Patient's cognitive ability adequate to safely complete daily activities?: Yes Is the  patient deaf or have difficulty hearing?: No Does the patient have difficulty seeing, even when wearing glasses/contacts?: No Does the patient have difficulty concentrating, remembering, or making decisions?: No Patient able to express need for assistance with ADLs?: Yes Does the patient have difficulty dressing or bathing?: No Independently performs ADLs?: Yes (appropriate for developmental age) Does the patient have difficulty walking or climbing stairs?: No Weakness of Legs: None Weakness of Arms/Hands: None  Permission Sought/Granted                  Emotional Assessment     Affect (typically observed): Appropriate Orientation: : Oriented to Self,Oriented to Place,Oriented to  Time,Oriented to Situation Alcohol / Substance Use: Not Applicable Psych Involvement: No (comment)  Admission diagnosis:  Fever [R50.9] Fever, unspecified fever cause [R50.9] Patient Active Problem List   Diagnosis Date Noted  . Bacteremia   . Fever 09/01/2020  . HTN (hypertension) 09/01/2020  . ESRD (end stage renal disease) ([place]) 09/01/2020  . Chronic respiratory failure with hypoxia ([place]) 09/01/2020  . Respiratory distress   . Hypertensive emergency   . Respiratory failure ([place]) 08/28/2020  . Acute respiratory failure with hypoxia ([place]) 11/28/2019  . Fluid overload 11/28/2019  . Hypertensive urgency 11/28/2019  . Type 2 diabetes mellitus with other specified complication ([place]) [id number]  . Anemia of chronic disease 11/28/2019  . Morbid obesity ([place]) 11/28/2019   PCP:  Medicine, [facility] Pharm[facility]:   [place], [place] [address] [place] [address] Phone: [phone] Fax: [phone]     Social Determinants of Health (SDOH) Interventions    Readmission Risk Interventions No flowsheet data found.

## 2020-09-06 NOTE — Progress Notes (Signed)
Patient transported by [facility] to [facility] for IR portacath placement

## 2020-09-06 NOTE — Progress Notes (Signed)
PRN Labetalol given for BP 179/75.   09/06/20 1700  Vitals  BP (!) 179/75

## 2020-09-06 NOTE — Progress Notes (Signed)
Arrived via [facility] from [facility] for tunneled dialysis catheter placement appointment in [place]. Report received from [facility] EMT. PIV in Left EJ flushed and patent. Denies need to use restroom prior to procedure. Remains on 3L nasal cannula as baseline home oxygen use.

## 2020-09-06 NOTE — Progress Notes (Signed)
Patient returned [facility] post procedure for IR Hemodialysis catheter placed to left side. Patient alert and oriented. Given snack per her request and transferred to dialysis . Refused her [facility] states they gave her antibiotics at [facility] . Will notiify Dr. Carles Collet

## 2020-09-07 DIAGNOSIS — N186 End stage renal disease: Secondary | ICD-10-CM | POA: Diagnosis not present

## 2020-09-07 DIAGNOSIS — R7881 Bacteremia: Secondary | ICD-10-CM | POA: Diagnosis not present

## 2020-09-07 DIAGNOSIS — J9611 Chronic respiratory failure with hypoxia: Secondary | ICD-10-CM | POA: Diagnosis not present

## 2020-09-07 LAB — RENAL FUNCTION PANEL
Albumin: 3.6 g/dL (ref 3.5–5.0)
Anion gap: 12 (ref 5–15)
BUN: 46 mg/dL — ABNORMAL HIGH (ref 6–20)
CO2: 27 mmol/L (ref 22–32)
Calcium: 8.2 mg/dL — ABNORMAL LOW (ref 8.9–10.3)
Chloride: 96 mmol/L — ABNORMAL LOW (ref 98–111)
Creatinine, Ser: 10.7 mg/dL — ABNORMAL HIGH (ref 0.44–1.00)
GFR, Estimated: 4 mL/min — ABNORMAL LOW (ref >60)
Glucose, Bld: 162 mg/dL — ABNORMAL HIGH (ref 70–99)
Phosphorus: 5.6 mg/dL — ABNORMAL HIGH (ref 2.5–4.6)
Potassium: 3.9 mmol/L (ref 3.5–5.1)
Sodium: 135 mmol/L (ref 135–145)

## 2020-09-07 LAB — CBC
HCT: 31 % — ABNORMAL LOW (ref 36.0–46.0)
Hemoglobin: 9.5 g/dL — ABNORMAL LOW (ref 12.0–15.0)
MCH: 31.8 pg (ref 26.0–34.0)
MCHC: 30.6 g/dL (ref 30.0–36.0)
MCV: 103.7 fL — ABNORMAL HIGH (ref 80.0–100.0)
Platelets: 188 10*3/uL (ref 150–400)
RBC: 2.99 MIL/uL — ABNORMAL LOW (ref 3.87–5.11)
RDW: 12.9 % (ref 11.5–15.5)
WBC: 9.4 10*3/uL (ref 4.0–10.5)
nRBC: 0 % (ref 0.0–0.2)

## 2020-09-07 LAB — GLUCOSE, CAPILLARY
Glucose-Capillary: 166 mg/dL — ABNORMAL HIGH (ref 70–99)
Glucose-Capillary: 157 mg/dL — ABNORMAL HIGH (ref 70–99)
Glucose-Capillary: 167 mg/dL — ABNORMAL HIGH (ref 70–99)

## 2020-09-07 MED ORDER — LISINOPRIL 10 MG PO TABS: 10.0000 mg | ORAL_TABLET | Freq: Every day | ORAL | 1 refills | Status: DC

## 2020-09-07 MED ORDER — CHLORHEXIDINE GLUCONATE CLOTH 2 % EX PADS: 6.0000 | MEDICATED_PAD | Freq: Every day | CUTANEOUS | Status: DC

## 2020-09-07 MED ORDER — LISINOPRIL 10 MG PO TABS: 10.0000 mg | ORAL_TABLET | Freq: Every day | ORAL | Status: DC

## 2020-09-07 NOTE — TOC Transition Note (Addendum)
Transition of Care [facility]) - CM/SW Discharge Note   Patient Details  Name: Courtney Gray MRN: [id number] Date of Birth: 06-17-1970  Transition of Care [facility]) CM/SW Contact:  Natasha Bence, LCSW Phone Number: 09/07/2020, 12:02 PM   Clinical Narrative:    CSW notified of patient's readiness for discharge. CSW faxed discharge summary for IV antibiotics with hemo dialysis to [facility] at [phone]. TOC signing off.   Addendum CSW received "Busy" results form fax. CSW attempted again with same results. CSW attempted to call facilty. CSW received busy signal when call was made. CSW contacted [facility] who's staff reported that [facility] had been having issues with their phone line. CSW contacted [facility] admissions. [facility] admissions provided CSW with [name]'s Admin's email. CSW emailed Patient's discharge summary. TOC signing off.    Final next level of care: Home/Self Care Barriers to Discharge: Barriers Resolved   Patient Goals and CMS Choice Patient states their goals for this hospitalization and ongoing recovery are:: Return home CMS Medicare.gov Compare Post Acute Care list provided to:: Patient Choice offered to / list presented to : Patient  Discharge Placement                    Patient and family notified of of transfer: 09/07/20  Discharge Plan and Services     Post Acute Care Choice: Dialysis                               Social Determinants of Health (SDOH) Interventions     Readmission Risk Interventions No flowsheet data found.

## 2020-09-07 NOTE — Progress Notes (Signed)
[facility] NEPHROLOGY PROGRESS NOTE  Assessment/ Plan: Pt is a 50 y.o. yo female with morbid obesity, DM, HTN, ESRD on HD TTS at [facility] was admitted with fever, seen as a consultation for ESRD management.  Outpatient HD: [name], TTS, EDW 167.5 kg, 1K/2.5 Ca,Heparin 2600 units bolus then 600 unit hourly,4 hours,BF 400,DF 500,RIJ catheter Hectoral 37mg IV/HD Epogen 7000Units IV/HD  Othercinacalcet 30 mg every HD  #Acute febrile illness due to Pseudomonas bacteremia related with dialysis catheter.   The HD catheter was removed on 5/30 after dialysis.  The repeat blood culture done on 5/30 is no growth in more than 3 days.  The tunnel catheter was placed on 09/06/2020 by IR on left side.  She is afebrile and clinically recovering well.  Plan for IV ceftazidime day postdialysis till 6/11 per primary team.  # ESRD TTS: Last HD on 6/2 with 2 L UF, tolerated well.  Clinically stable.  No issue with the new catheter.  Plan to resume regular dialysis tomorrow.  Reportedly the patient is being discharged home today.   # Anemia: Monitor hemoglobin.  Continue ESA.  No iron because of bacteremia.  # Secondary hyperparathyroidism: Monitor calcium and phosphorus level.  Given mild hypocalcemia, I will continue to hold Sensipar.  # HTN/volume: BP remains elevated.  I will increase lisinopril to 10 mg.  Continue amlodipine and carvedilol.  UF during HD.  #Hyponatremia, hypervolemic: Managed with dialysis.  Fluid restriction.  Monitor lab. Discussed with the primary team.  Subjective: Patient reports he is doing well and ready to go home today.  She had new catheter placed yesterday and received dialysis.  She denies nausea, vomiting, chest pain, shortness of breath.  Remains afebrile.  She is very cheerful today.  Objective Vital signs in last 24 hours: Vitals:   09/06/20 1911 09/06/20 2037 09/07/20 0446 09/07/20 0500  BP: (!) 160/70 (!) 169/74 (!) 173/81   Pulse: 66 60 (!)  59   Resp: '16 20 20   '$ Temp: 98.2 F (36.8 C) 98.4 F (36.9 C) 98.1 F (36.7 C)   TempSrc: Oral Oral Oral   SpO2: 100% 100% 100%   Weight: (!) 162 kg   4.63 kg  Height:       Weight change: 0 kg  Intake/Output Summary (Last 24 hours) at 09/07/2020 1106 Last data filed at 09/07/2020 0900 Gross per 24 hour  Intake 905.17 ml  Output 2000 ml  Net -1094.83 ml       Labs: Basic Metabolic Panel: Recent Labs  Lab 09/02/20 0617 09/03/20 0914 09/07/20 0601  NA 132* 129* 135  K 3.3* 3.5 3.9  CL 93* 91* 96*  CO2 '23 25 27  '$ GLUCOSE 144* 239* 162*  BUN 30* 54* 46*  CREATININE 8.01* 11.37* 10.70*  CALCIUM 8.1* 7.8* 8.2*  PHOS  --  5.9* 5.6*   Liver Function Tests: Recent Labs  Lab 09/01/20 1500 09/02/20 0617 09/03/20 0914 09/07/20 0601  AST 14* 25  --   --   ALT 17 23  --   --   ALKPHOS 49 52  --   --   BILITOT 0.7 0.9  --   --   PROT 7.4 7.5  --   --   ALBUMIN 3.5 3.6 3.4* 3.6   Recent Labs  Lab 09/01/20 1500  LIPASE 35   No results for input(s): AMMONIA in the last 168 hours. CBC: Recent Labs  Lab 09/01/20 1500 09/02/20 0617 09/03/20 0914 09/07/20 0601  WBC 12.0* 14.9* 10.0 9.4  NEUTROABS 10.0*  --   --   --   HGB 9.2* 9.2* 8.9* 9.5*  HCT 29.8* 30.2* 28.6* 31.0*  MCV 103.5* 103.1* 102.1* 103.7*  PLT 158 153 158 188   Cardiac Enzymes: No results for input(s): CKTOTAL, CKMB, CKMBINDEX, TROPONINI in the last 168 hours. CBG: Recent Labs  Lab 09/05/20 1718 09/05/20 2205 09/06/20 0728 09/06/20 2112 09/07/20 0755  GLUCAP 175* 217* 161* 138* 157*    Iron Studies: No results for input(s): IRON, TIBC, TRANSFERRIN, FERRITIN in the last 72 hours. Studies/Results: IR US Guide Vasc Access Left  Result Date: 09/06/2020 CLINICAL DATA:  History of end-stage renal disease. Previously placed right-sided tunneled hemodialysis catheter has been removed due to bacteremia. Bacteremia has been adequately treated and the patient now requires a new tunneled dialysis  catheter. EXAM: TUNNELED CENTRAL VENOUS HEMODIALYSIS CATHETER PLACEMENT WITH ULTRASOUND AND FLUOROSCOPIC GUIDANCE ANESTHESIA/SEDATION: 1.0 mg IV Versed; 50 mcg IV Fentanyl. Total Moderate Sedation Time:   32 minutes. The patient's level of consciousness and physiologic status were continuously monitored during the procedure by Radiology nursing. MEDICATIONS: 1.5 g IV vancomycin. FLUOROSCOPY TIME:  42 seconds.  10.7 mGy. PROCEDURE: The procedure, risks, benefits, and alternatives were explained to the patient. Questions regarding the procedure were encouraged and answered. The patient understands and consents to the procedure. A timeout was performed prior to initiating the procedure. The left neck and chest were prepped with chlorhexidine in a sterile fashion, and a sterile drape was applied covering the operative field. Maximum barrier sterile technique with sterile gowns and gloves were used for the procedure. Local anesthesia was provided with 1% lidocaine. After creating a small venotomy incision, a 21 gauge needle was advanced into the left internal jugular vein under direct, real-time ultrasound guidance. Ultrasound image documentation was performed. After securing guidewire access, an 8 Fr dilator was placed. A J-wire was kinked to measure appropriate catheter length. A Palindrome tunneled hemodialysis catheter measuring 28 cm from tip to cuff was chosen for placement. This was tunneled in a retrograde fashion from the chest wall to the venotomy incision. At the venotomy, serial dilatation was performed and a 15 Fr peel-away sheath was placed over a guidewire. The catheter was then placed through the sheath and the sheath removed. Final catheter positioning was confirmed and documented with a fluoroscopic spot image. The catheter was aspirated, flushed with saline, and injected with appropriate volume heparin dwells. The venotomy incision was closed with subcuticular 4-0 Vicryl. Dermabond was applied to the  incision. The catheter exit site was secured with 0-Prolene retention sutures. COMPLICATIONS: None.  No pneumothorax. FINDINGS: After catheter placement, the tip lies in the right atrium. The catheter aspirates normally and is ready for immediate use. IMPRESSION: Placement of tunneled hemodialysis catheter via the left internal jugular vein. The catheter tip lies in the right atrium. The catheter is ready for immediate use. Electronically Signed   By: Aletta Edouard M.D.   On: 09/06/2020 16:12   IR TUNNELED CENTRAL VENOUS CATHETER PLACEMENT  Result Date: 09/06/2020 CLINICAL DATA:  History of end-stage renal disease. Previously placed right-sided tunneled hemodialysis catheter has been removed due to bacteremia. Bacteremia has been adequately treated and the patient now requires a new tunneled dialysis catheter. EXAM: TUNNELED CENTRAL VENOUS HEMODIALYSIS CATHETER PLACEMENT WITH ULTRASOUND AND FLUOROSCOPIC GUIDANCE ANESTHESIA/SEDATION: 1.0 mg IV Versed; 50 mcg IV Fentanyl. Total Moderate Sedation Time:   32 minutes. The patient's level of consciousness and physiologic status were continuously monitored during  the procedure by Radiology nursing. MEDICATIONS: 1.5 g IV vancomycin. FLUOROSCOPY TIME:  42 seconds.  10.7 mGy. PROCEDURE: The procedure, risks, benefits, and alternatives were explained to the patient. Questions regarding the procedure were encouraged and answered. The patient understands and consents to the procedure. A timeout was performed prior to initiating the procedure. The left neck and chest were prepped with chlorhexidine in a sterile fashion, and a sterile drape was applied covering the operative field. Maximum barrier sterile technique with sterile gowns and gloves were used for the procedure. Local anesthesia was provided with 1% lidocaine. After creating a small venotomy incision, a 21 gauge needle was advanced into the left internal jugular vein under direct, real-time ultrasound guidance.  Ultrasound image documentation was performed. After securing guidewire access, an 8 Fr dilator was placed. A J-wire was kinked to measure appropriate catheter length. A Palindrome tunneled hemodialysis catheter measuring 28 cm from tip to cuff was chosen for placement. This was tunneled in a retrograde fashion from the chest wall to the venotomy incision. At the venotomy, serial dilatation was performed and a 15 Fr peel-away sheath was placed over a guidewire. The catheter was then placed through the sheath and the sheath removed. Final catheter positioning was confirmed and documented with a fluoroscopic spot image. The catheter was aspirated, flushed with saline, and injected with appropriate volume heparin dwells. The venotomy incision was closed with subcuticular 4-0 Vicryl. Dermabond was applied to the incision. The catheter exit site was secured with 0-Prolene retention sutures. COMPLICATIONS: None.  No pneumothorax. FINDINGS: After catheter placement, the tip lies in the right atrium. The catheter aspirates normally and is ready for immediate use. IMPRESSION: Placement of tunneled hemodialysis catheter via the left internal jugular vein. The catheter tip lies in the right atrium. The catheter is ready for immediate use. Electronically Signed   By: Aletta Edouard M.D.   On: 09/06/2020 16:12    Medications: Infusions: . sodium chloride    . sodium chloride    . sodium chloride    . ceFEPime (MAXIPIME) IV      Scheduled Medications: . amLODipine  10 mg Oral Daily  . aspirin EC  81 mg Oral Daily  . atorvastatin  10 mg Oral q1800  . carvedilol  25 mg Oral BID  . Chlorhexidine Gluconate Cloth  6 each Topical Q0600  . Chlorhexidine Gluconate Cloth  6 each Topical Q0600  . doxercalciferol  5 mcg Intravenous Q T,Th,Sa-HD  . famotidine  20 mg Oral Daily  . gabapentin  100 mg Oral 2 times per day on Sun Mon Wed Fri   And  . gabapentin  100 mg Oral 3 times per day on Tue Thu Sat  . heparin  5,000  Units Subcutaneous Q8H  . insulin aspart  0-5 Units Subcutaneous QHS  . insulin aspart  0-6 Units Subcutaneous TID WC  . insulin glargine  12 Units Subcutaneous QHS  . lidocaine-EPINEPHrine  20 mL Intradermal Once  . lisinopril  5 mg Oral Daily  . senna-docusate  2 tablet Oral QHS  . sodium chloride flush  3 mL Intravenous Q12H  . sodium chloride flush  3 mL Intravenous Q12H  . tiZANidine  4 mg Oral Q8H    have reviewed scheduled and prn medications.  Physical Exam: General:NAD, comfortable Heart:RRR, s1s2 nl Lungs: Clear b/l, no crackle Abdomen:soft, Non-tender, non-distended Extremities: Trace LE edema  Dialysis Access: Left IJ TDC catheter site is clean.  Sherrick Araki Tanna Furry 09/07/2020,11:06 AM  LOS: 5  days

## 2020-09-07 NOTE — Progress Notes (Signed)
EJ removed from left neck.  No bleeding noted.  Dressing changed to hemodialysis catheter due gauze being saturated.  Discharge instructions reviewed and to continue with OP dialysis tomorrow.  Transported by WC to main entrance to [employer] and to go home.

## 2020-09-07 NOTE — Discharge Summary (Addendum)
Physician Discharge Summary  Courtney Gray [id number] DOB: 04-13-1970 DOA: 09/01/2020  PCP: [facility]  Admit date: 09/01/2020 Discharge date: 09/07/2020  Admitted From: Home Disposition:  Home   Recommendations for Outpatient Follow-up:  1. Follow up with PCP in 1-2 weeks 2. Please obtain BMP/CBC in one week    Equipment/Devices: 3L oxygen [place]  Discharge Condition: Stable CODE STATUS: FULL Diet recommendation: Heart Healthy / Carb Modified    Brief/Interim Summary: 50 y.o.femalewith past medical history relevant for morbid obesity, HTN, chronic hypoxic respiratory failure on 2 to 3 L of oxygen via nasal cannula, ESRD on HD Tuesdays Thursdays and Saturdays with last HD session on 08/30/2020,diabetes and HTN who presents to the ED with recurrent fevers -Patient was apparently discharged from [facility] on 08/31/2020 after being admitted on 08/28/2020 for volume overload and hypertensive urgency with BiPAP and IV nitro and diarrhea back-to-back hemodialysis sessions --As per patient on 08/31/2020 when she got home she had fever above 102 and chills took some Tylenol and felt better -Patient went to hemodialysis session on 09/01/2020 and was found to have a fever above 102 so she was sent for hemodialysis center to the ED for further evaluation -Patient endorses right upper quadrant tenderness with nausea but no emesis last BM 08/31/2020,no diarrhea, no chest pains no palpitations no shortness of breath -Patient denies productive cough -In the ED chest x-ray without acute findings and COVID negative -In ED WBC is 12.0 hemoglobin is 9.2 -Lipase is 35,sodium is 131 chloride is 96 glucose 170 creatinine 11.2 -CT abdomen pelvis neg for acute abnormalities -Blood cultures pending -Patient does not make urine --Patient had a new right IJ HD catheter placed on 08/28/2020 after previous HD catheter fell out on 08/25/2020 prior to this admission  Discharge Diagnoses:   Pseudomonas bacteremia -09/01/20 blood culture = Pseudomonas -09/03/20 blood culture = remains neg on day of dd/c -Dialysis catheter removed on 09/03/2020 after last dialysis treatment. -Repeating blood cultures on 09/03/2020--neg -Continue IV cefepimefor now. -Cultures results demonstrating pansensitive Pseudomonas. -Continue as needed antipyretics -Continue supportive care and follow clinical response. -IR placed new dialysis catheter on 09-06-20  -last HD on 09/06/20 -plan d/c with ceftazidime 2 grams with each HD (Tues-Thur-Sat)  with last dose on 09/15/20 which would last till 14 days from last neg blood culture  ESRD -Patient chronically on Tuesday-Thursday-Saturday dialysis schedule -Last hemodialysis as an inpatient on 09/03/2020; at this moment we are anticipating a line holiday while providing antibiotics for his Pseudomonas bacteremia and will have intention to replace dialysis catheter on 6-22 forhernext dialysis treatment. -Nephrology service is on board and we will continue to follow further recommendations and dialysis orders while inpatient.  anemia of chronic kidney disease -IV iron and Epogen therapy as per nephrology service. -Hemoglobin stable and no signs of overt bleeding currently.  morbid obesity -Low calorie diet, portion control and increase physical activity has been discussed with patient -Body mass index is 60.39 kg/m.  hypertension -Continue to follow vital signs and adjust antihypertensive agents as needed -Heart healthy/renal diet encouraged. 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  hyperlipidemia -Continue Lipitor. -Low-fat diet encouraged.  chronic respiratory failure with hypoxia -Continue chronic oxygen supplementationat 3L -Good saturation on chronic  supplementation. -Reports no shortness of breath.  Discharge Instructions   Allergies as of 09/07/2020      Reactions  Benzonatate Other (See Comments)   Hallucinations   Hydralazine Other (See Comments)   Hallucinations   Amoxicillin Nausea And Vomiting   Clonidine Other (See Comments)   Altered mental state   Morphine Itching   Oxycodone Itching      Medication List    STOP taking these medications   lidocaine-prilocaine cream Commonly known as: EMLA     TAKE these medications   acetaminophen 500 MG tablet Commonly known as: TYLENOL Take 1,000 mg by mouth every 6 (six) hours as needed for moderate pain or headache.   amLODipine 10 MG tablet Commonly known as: NORVASC Take 10 mg by mouth See admin instructions. Every other day (on dialysis days) if blood pressure is in the 200's.   aspirin EC 81 MG tablet Take 81 mg by mouth daily.   atorvastatin 10 MG tablet Commonly known as: LIPITOR Take 10 mg by mouth daily.   carvedilol 25 MG tablet Commonly known as: COREG Take 25 mg by mouth 2 (two) times daily.   cinacalcet 30 MG tablet Commonly known as: SENSIPAR Take 1 tablet (30 mg total) by mouth Every Tuesday,Thursday,and Saturday with dialysis.   diphenhydrAMINE 25 MG tablet Commonly known as: BENADRYL Take 50 mg by mouth daily as needed for allergies.   docusate sodium 100 MG capsule Commonly known as: COLACE Take 100 mg by mouth daily.   doxercalciferol 4 MCG/2ML injection Commonly known as: HECTOROL Inject 2.5 mLs (5 mcg total) into the vein Every Tuesday,Thursday,and Saturday with dialysis.   famotidine 20 MG tablet Commonly known as: PEPCID Take 20 mg by mouth daily as needed for heartburn or indigestion.   gabapentin 100 MG capsule Commonly known as: NEURONTIN Take 100-200 mg by mouth See admin instructions. Take 100 mg twice daily on non dialysis days.  Take 100 mg at 9am before dialysis, '100mg'$  after dialysis at 5pm, and '100mg'$  at 1am on  Tuesdays, Thursdays,and Saturdays.   GAS-X PO Take 1 tablet by mouth daily as needed (gas).   Lantus SoloStar 100 UNIT/ML Solostar Pen Generic drug: insulin glargine Inject 12 Units into the skin at bedtime.   lisinopril 10 MG tablet Commonly known as: ZESTRIL Take 1 tablet (10 mg total) by mouth daily. Start taking on: September 08, 2020 What changed:   medication strength  how much to take   nitroGLYCERIN 0.4 MG SL tablet Commonly known as: NITROSTAT Place 0.4 mg under the tongue every 5 (five) minutes as needed for chest pain.   sodium chloride 0.65 % Soln nasal spray Commonly known as: OCEAN Place 1 spray into both nostrils in the morning and at bedtime.   tiZANidine 4 MG tablet Commonly known as: ZANAFLEX Take 4 mg by mouth every 8 (eight) hours.       Allergies  Allergen Reactions  . Benzonatate Other (See Comments)    Hallucinations  . Hydralazine Other (See Comments)    Hallucinations  . Amoxicillin Nausea And Vomiting  . Clonidine Other (See Comments)    Altered mental state  . Morphine Itching  . Oxycodone Itching    Consultations:  renal   Procedures/Studies: CT ABDOMEN PELVIS WO CONTRAST  Result Date: 09/02/2020 CLINICAL DATA:  Abdominal pain and fever EXAM: CT ABDOMEN AND PELVIS WITHOUT CONTRAST TECHNIQUE: Multidetector CT imaging of the abdomen and pelvis was performed following the standard protocol without IV contrast. COMPARISON:  04/08/2020 FINDINGS: Lower chest: Mild scarring is noted in the lung bases bilaterally. No sizable effusion is seen. Hepatobiliary: No focal liver abnormality  is seen. No gallstones, gallbladder wall thickening, or biliary dilatation. Pancreas: Unremarkable. No pancreatic ductal dilatation or surrounding inflammatory changes. Spleen: Normal in size without focal abnormality. Adrenals/Urinary Tract: Adrenal glands are within normal limits. Kidneys demonstrate mild atrophy bilaterally. Perinephric stranding is noted similar to  that seen on prior exams. Vascular calcifications are noted. No obstructive changes are seen. Bladder is decompressed. Stomach/Bowel: The appendix is well visualized and within normal limits. Colon shows no obstructive or inflammatory changes. Small bowel and stomach appear within normal limits. Vascular/Lymphatic: Aortic atherosclerosis. No enlarged abdominal or pelvic lymph nodes. Reproductive: Uterus and bilateral adnexa are unremarkable. Other: No abdominal wall hernia or abnormality. No abdominopelvic ascites. Musculoskeletal: Degenerative changes of lumbar spine are noted. IMPRESSION: Renal atrophy similar to that seen on the prior exam. No acute abnormality noted. Electronically Signed   By: Inez Catalina M.D.   On: 09/02/2020 15:47   IR US Guide Vasc Access Left  Result Date: 09/06/2020 CLINICAL DATA:  History of end-stage renal disease. Previously placed right-sided tunneled hemodialysis catheter has been removed due to bacteremia. Bacteremia has been adequately treated and the patient now requires a new tunneled dialysis catheter. EXAM: TUNNELED CENTRAL VENOUS HEMODIALYSIS CATHETER PLACEMENT WITH ULTRASOUND AND FLUOROSCOPIC GUIDANCE ANESTHESIA/SEDATION: 1.0 mg IV Versed; 50 mcg IV Fentanyl. Total Moderate Sedation Time:   32 minutes. The patient's level of consciousness and physiologic status were continuously monitored during the procedure by Radiology nursing. MEDICATIONS: 1.5 g IV vancomycin. FLUOROSCOPY TIME:  42 seconds.  10.7 mGy. PROCEDURE: The procedure, risks, benefits, and alternatives were explained to the patient. Questions regarding the procedure were encouraged and answered. The patient understands and consents to the procedure. A timeout was performed prior to initiating the procedure. The left neck and chest were prepped with chlorhexidine in a sterile fashion, and a sterile drape was applied covering the operative field. Maximum barrier sterile technique with sterile gowns and gloves  were used for the procedure. Local anesthesia was provided with 1% lidocaine. After creating a small venotomy incision, a 21 gauge needle was advanced into the left internal jugular vein under direct, real-time ultrasound guidance. Ultrasound image documentation was performed. After securing guidewire access, an 8 Fr dilator was placed. A J-wire was kinked to measure appropriate catheter length. A Palindrome tunneled hemodialysis catheter measuring 28 cm from tip to cuff was chosen for placement. This was tunneled in a retrograde fashion from the chest wall to the venotomy incision. At the venotomy, serial dilatation was performed and a 15 Fr peel-away sheath was placed over a guidewire. The catheter was then placed through the sheath and the sheath removed. Final catheter positioning was confirmed and documented with a fluoroscopic spot image. The catheter was aspirated, flushed with saline, and injected with appropriate volume heparin dwells. The venotomy incision was closed with subcuticular 4-0 Vicryl. Dermabond was applied to the incision. The catheter exit site was secured with 0-Prolene retention sutures. COMPLICATIONS: None.  No pneumothorax. FINDINGS: After catheter placement, the tip lies in the right atrium. The catheter aspirates normally and is ready for immediate use. IMPRESSION: Placement of tunneled hemodialysis catheter via the left internal jugular vein. The catheter tip lies in the right atrium. The catheter is ready for immediate use. Electronically Signed   By: Aletta Edouard M.D.   On: 09/06/2020 16:12   DG Chest Port 1 View  Result Date: 09/03/2020 CLINICAL DATA:  Status post dialysis catheter removal EXAM: PORTABLE CHEST 1 VIEW COMPARISON:  09/01/2020 FINDINGS: Right jugular dialysis catheter has  been removed in the interval. Cardiac shadow remains prominent. Lungs are well aerated bilaterally. No focal infiltrate or effusion is seen. No acute bony abnormality is noted. IMPRESSION: No  acute abnormality noted. Electronically Signed   By: Inez Catalina M.D.   On: 09/03/2020 15:39   DG Chest Portable 1 View  Result Date: 09/01/2020 CLINICAL DATA:  Elevated temperature during dialysis today. Weakness and fever. EXAM: PORTABLE CHEST 1 VIEW COMPARISON:  08/28/2020 and older exams. FINDINGS: Cardiac silhouette normal in size.  No mediastinal or hilar masses. Lungs clear. Opacity noted at the lung bases on the recent prior study is no longer visualized. Lung bases are somewhat suboptimally visualized due to the significant overlying soft tissue. Cannot exclude a small effusion. No pneumothorax. Right anterior chest wall internal jugular tunneled central venous catheter is stable. IMPRESSION: 1. No acute cardiopulmonary disease. Electronically Signed   By: Lajean Manes M.D.   On: 09/01/2020 14:55   DG Chest Portable 1 View  Result Date: 08/28/2020 CLINICAL DATA:  50 year old female with respiratory failure. EXAM: PORTABLE CHEST 1 VIEW COMPARISON:  Chest radiograph dated 08/25/2020. FINDINGS: Right IJ dialysis catheter with tip over the right atrium. There is shallow inspiration. There is cardiomegaly with vascular congestion and edema. Significant worsening of CHF or fluid overload compared to the prior radiograph. Small bilateral pleural effusions and bibasilar atelectasis. Pneumonia is not excluded. No pneumothorax. No acute osseous pathology. IMPRESSION: Cardiomegaly with significant worsening of CHF or fluid overload compared to the prior radiograph. Electronically Signed   By: Anner Crete M.D.   On: 08/28/2020 16:23   DG Chest Port 1 View  Result Date: 08/25/2020 CLINICAL DATA:  Weakness. EXAM: PORTABLE CHEST 1 VIEW COMPARISON:  November 28, 2019 FINDINGS: A right-sided venous catheter is seen with its distal tip overlying the midportion of the superior vena cava. This is approximately 5.0 cm proximal to the junction of the superior vena cava and right atrium. There is no evidence of  acute infiltrate, pleural effusion or pneumothorax. Stable moderate to marked severity enlargement of the cardiac silhouette is seen. The visualized skeletal structures are unremarkable. IMPRESSION: Right-sided venous catheter positioning, as described above. Electronically Signed   By: Virgina Norfolk M.D.   On: 08/25/2020 15:43   IR TUNNELED CENTRAL VENOUS CATHETER PLACEMENT  Result Date: 09/06/2020 CLINICAL DATA:  History of end-stage renal disease. Previously placed right-sided tunneled hemodialysis catheter has been removed due to bacteremia. Bacteremia has been adequately treated and the patient now requires a new tunneled dialysis catheter. EXAM: TUNNELED CENTRAL VENOUS HEMODIALYSIS CATHETER PLACEMENT WITH ULTRASOUND AND FLUOROSCOPIC GUIDANCE ANESTHESIA/SEDATION: 1.0 mg IV Versed; 50 mcg IV Fentanyl. Total Moderate Sedation Time:   32 minutes. The patient's level of consciousness and physiologic status were continuously monitored during the procedure by Radiology nursing. MEDICATIONS: 1.5 g IV vancomycin. FLUOROSCOPY TIME:  42 seconds.  10.7 mGy. PROCEDURE: The procedure, risks, benefits, and alternatives were explained to the patient. Questions regarding the procedure were encouraged and answered. The patient understands and consents to the procedure. A timeout was performed prior to initiating the procedure. The left neck and chest were prepped with chlorhexidine in a sterile fashion, and a sterile drape was applied covering the operative field. Maximum barrier sterile technique with sterile gowns and gloves were used for the procedure. Local anesthesia was provided with 1% lidocaine. After creating a small venotomy incision, a 21 gauge needle was advanced into the left internal jugular vein under direct, real-time ultrasound guidance. Ultrasound image documentation was performed.  After securing guidewire access, an 8 Fr dilator was placed. A J-wire was kinked to measure appropriate catheter length. A  Palindrome tunneled hemodialysis catheter measuring 28 cm from tip to cuff was chosen for placement. This was tunneled in a retrograde fashion from the chest wall to the venotomy incision. At the venotomy, serial dilatation was performed and a 15 Fr peel-away sheath was placed over a guidewire. The catheter was then placed through the sheath and the sheath removed. Final catheter positioning was confirmed and documented with a fluoroscopic spot image. The catheter was aspirated, flushed with saline, and injected with appropriate volume heparin dwells. The venotomy incision was closed with subcuticular 4-0 Vicryl. Dermabond was applied to the incision. The catheter exit site was secured with 0-Prolene retention sutures. COMPLICATIONS: None.  No pneumothorax. FINDINGS: After catheter placement, the tip lies in the right atrium. The catheter aspirates normally and is ready for immediate use. IMPRESSION: Placement of tunneled hemodialysis catheter via the left internal jugular vein. The catheter tip lies in the right atrium. The catheter is ready for immediate use. Electronically Signed   By: Aletta Edouard M.D.   On: 09/06/2020 16:12         Discharge Exam: Vitals:   09/06/20 2037 09/07/20 0446  BP: (!) 169/74 (!) 173/81  Pulse: 60 (!) 59  Resp: 20 20  Temp: 98.4 F (36.9 C) 98.1 F (36.7 C)  SpO2: 100% 100%   Vitals:   09/06/20 1911 09/06/20 2037 09/07/20 0446 09/07/20 0500  BP: (!) 160/70 (!) 169/74 (!) 173/81   Pulse: 66 60 (!) 59   Resp: '16 20 20   '$ Temp: 98.2 F (36.8 C) 98.4 F (36.9 C) 98.1 F (36.7 C)   TempSrc: Oral Oral Oral   SpO2: 100% 100% 100%   Weight: (!) 162 kg   4.63 kg  Height:        General: Pt is alert, awake, not in acute distress Cardiovascular: RRR, S1/S2 +, no rubs, no gallops Respiratory: CTA bilaterally, no wheezing, no rhonchi Abdominal: Soft, NT, ND, bowel sounds + Extremities: 1 + LE edema, no cyanosis   The results of significant diagnostics from  this hospitalization (including imaging, microbiology, ancillary and laboratory) are listed below for reference.    Significant Diagnostic Studies: CT ABDOMEN PELVIS WO CONTRAST  Result Date: 09/02/2020 CLINICAL DATA:  Abdominal pain and fever EXAM: CT ABDOMEN AND PELVIS WITHOUT CONTRAST TECHNIQUE: Multidetector CT imaging of the abdomen and pelvis was performed following the standard protocol without IV contrast. COMPARISON:  04/08/2020 FINDINGS: Lower chest: Mild scarring is noted in the lung bases bilaterally. No sizable effusion is seen. Hepatobiliary: No focal liver abnormality is seen. No gallstones, gallbladder wall thickening, or biliary dilatation. Pancreas: Unremarkable. No pancreatic ductal dilatation or surrounding inflammatory changes. Spleen: Normal in size without focal abnormality. Adrenals/Urinary Tract: Adrenal glands are within normal limits. Kidneys demonstrate mild atrophy bilaterally. Perinephric stranding is noted similar to that seen on prior exams. Vascular calcifications are noted. No obstructive changes are seen. Bladder is decompressed. Stomach/Bowel: The appendix is well visualized and within normal limits. Colon shows no obstructive or inflammatory changes. Small bowel and stomach appear within normal limits. Vascular/Lymphatic: Aortic atherosclerosis. No enlarged abdominal or pelvic lymph nodes. Reproductive: Uterus and bilateral adnexa are unremarkable. Other: No abdominal wall hernia or abnormality. No abdominopelvic ascites. Musculoskeletal: Degenerative changes of lumbar spine are noted. IMPRESSION: Renal atrophy similar to that seen on the prior exam. No acute abnormality noted. Electronically Signed   By:  Inez Catalina M.D.   On: 09/02/2020 15:47   IR US Guide Vasc Access Left  Result Date: 09/06/2020 CLINICAL DATA:  History of end-stage renal disease. Previously placed right-sided tunneled hemodialysis catheter has been removed due to bacteremia. Bacteremia has been  adequately treated and the patient now requires a new tunneled dialysis catheter. EXAM: TUNNELED CENTRAL VENOUS HEMODIALYSIS CATHETER PLACEMENT WITH ULTRASOUND AND FLUOROSCOPIC GUIDANCE ANESTHESIA/SEDATION: 1.0 mg IV Versed; 50 mcg IV Fentanyl. Total Moderate Sedation Time:   32 minutes. The patient's level of consciousness and physiologic status were continuously monitored during the procedure by Radiology nursing. MEDICATIONS: 1.5 g IV vancomycin. FLUOROSCOPY TIME:  42 seconds.  10.7 mGy. PROCEDURE: The procedure, risks, benefits, and alternatives were explained to the patient. Questions regarding the procedure were encouraged and answered. The patient understands and consents to the procedure. A timeout was performed prior to initiating the procedure. The left neck and chest were prepped with chlorhexidine in a sterile fashion, and a sterile drape was applied covering the operative field. Maximum barrier sterile technique with sterile gowns and gloves were used for the procedure. Local anesthesia was provided with 1% lidocaine. After creating a small venotomy incision, a 21 gauge needle was advanced into the left internal jugular vein under direct, real-time ultrasound guidance. Ultrasound image documentation was performed. After securing guidewire access, an 8 Fr dilator was placed. A J-wire was kinked to measure appropriate catheter length. A Palindrome tunneled hemodialysis catheter measuring 28 cm from tip to cuff was chosen for placement. This was tunneled in a retrograde fashion from the chest wall to the venotomy incision. At the venotomy, serial dilatation was performed and a 15 Fr peel-away sheath was placed over a guidewire. The catheter was then placed through the sheath and the sheath removed. Final catheter positioning was confirmed and documented with a fluoroscopic spot image. The catheter was aspirated, flushed with saline, and injected with appropriate volume heparin dwells. The venotomy incision  was closed with subcuticular 4-0 Vicryl. Dermabond was applied to the incision. The catheter exit site was secured with 0-Prolene retention sutures. COMPLICATIONS: None.  No pneumothorax. FINDINGS: After catheter placement, the tip lies in the right atrium. The catheter aspirates normally and is ready for immediate use. IMPRESSION: Placement of tunneled hemodialysis catheter via the left internal jugular vein. The catheter tip lies in the right atrium. The catheter is ready for immediate use. Electronically Signed   By: Aletta Edouard M.D.   On: 09/06/2020 16:12   DG Chest Port 1 View  Result Date: 09/03/2020 CLINICAL DATA:  Status post dialysis catheter removal EXAM: PORTABLE CHEST 1 VIEW COMPARISON:  09/01/2020 FINDINGS: Right jugular dialysis catheter has been removed in the interval. Cardiac shadow remains prominent. Lungs are well aerated bilaterally. No focal infiltrate or effusion is seen. No acute bony abnormality is noted. IMPRESSION: No acute abnormality noted. Electronically Signed   By: Inez Catalina M.D.   On: 09/03/2020 15:39   DG Chest Portable 1 View  Result Date: 09/01/2020 CLINICAL DATA:  Elevated temperature during dialysis today. Weakness and fever. EXAM: PORTABLE CHEST 1 VIEW COMPARISON:  08/28/2020 and older exams. FINDINGS: Cardiac silhouette normal in size.  No mediastinal or hilar masses. Lungs clear. Opacity noted at the lung bases on the recent prior study is no longer visualized. Lung bases are somewhat suboptimally visualized due to the significant overlying soft tissue. Cannot exclude a small effusion. No pneumothorax. Right anterior chest wall internal jugular tunneled central venous catheter is stable. IMPRESSION: 1. No  acute cardiopulmonary disease. Electronically Signed   By: Lajean Manes M.D.   On: 09/01/2020 14:55   DG Chest Portable 1 View  Result Date: 08/28/2020 CLINICAL DATA:  50 year old female with respiratory failure. EXAM: PORTABLE CHEST 1 VIEW COMPARISON:   Chest radiograph dated 08/25/2020. FINDINGS: Right IJ dialysis catheter with tip over the right atrium. There is shallow inspiration. There is cardiomegaly with vascular congestion and edema. Significant worsening of CHF or fluid overload compared to the prior radiograph. Small bilateral pleural effusions and bibasilar atelectasis. Pneumonia is not excluded. No pneumothorax. No acute osseous pathology. IMPRESSION: Cardiomegaly with significant worsening of CHF or fluid overload compared to the prior radiograph. Electronically Signed   By: Anner Crete M.D.   On: 08/28/2020 16:23   DG Chest Port 1 View  Result Date: 08/25/2020 CLINICAL DATA:  Weakness. EXAM: PORTABLE CHEST 1 VIEW COMPARISON:  November 28, 2019 FINDINGS: A right-sided venous catheter is seen with its distal tip overlying the midportion of the superior vena cava. This is approximately 5.0 cm proximal to the junction of the superior vena cava and right atrium. There is no evidence of acute infiltrate, pleural effusion or pneumothorax. Stable moderate to marked severity enlargement of the cardiac silhouette is seen. The visualized skeletal structures are unremarkable. IMPRESSION: Right-sided venous catheter positioning, as described above. Electronically Signed   By: Virgina Norfolk M.D.   On: 08/25/2020 15:43   IR TUNNELED CENTRAL VENOUS CATHETER PLACEMENT  Result Date: 09/06/2020 CLINICAL DATA:  History of end-stage renal disease. Previously placed right-sided tunneled hemodialysis catheter has been removed due to bacteremia. Bacteremia has been adequately treated and the patient now requires a new tunneled dialysis catheter. EXAM: TUNNELED CENTRAL VENOUS HEMODIALYSIS CATHETER PLACEMENT WITH ULTRASOUND AND FLUOROSCOPIC GUIDANCE ANESTHESIA/SEDATION: 1.0 mg IV Versed; 50 mcg IV Fentanyl. Total Moderate Sedation Time:   32 minutes. The patient's level of consciousness and physiologic status were continuously monitored during the procedure by  Radiology nursing. MEDICATIONS: 1.5 g IV vancomycin. FLUOROSCOPY TIME:  42 seconds.  10.7 mGy. PROCEDURE: The procedure, risks, benefits, and alternatives were explained to the patient. Questions regarding the procedure were encouraged and answered. The patient understands and consents to the procedure. A timeout was performed prior to initiating the procedure. The left neck and chest were prepped with chlorhexidine in a sterile fashion, and a sterile drape was applied covering the operative field. Maximum barrier sterile technique with sterile gowns and gloves were used for the procedure. Local anesthesia was provided with 1% lidocaine. After creating a small venotomy incision, a 21 gauge needle was advanced into the left internal jugular vein under direct, real-time ultrasound guidance. Ultrasound image documentation was performed. After securing guidewire access, an 8 Fr dilator was placed. A J-wire was kinked to measure appropriate catheter length. A Palindrome tunneled hemodialysis catheter measuring 28 cm from tip to cuff was chosen for placement. This was tunneled in a retrograde fashion from the chest wall to the venotomy incision. At the venotomy, serial dilatation was performed and a 15 Fr peel-away sheath was placed over a guidewire. The catheter was then placed through the sheath and the sheath removed. Final catheter positioning was confirmed and documented with a fluoroscopic spot image. The catheter was aspirated, flushed with saline, and injected with appropriate volume heparin dwells. The venotomy incision was closed with subcuticular 4-0 Vicryl. Dermabond was applied to the incision. The catheter exit site was secured with 0-Prolene retention sutures. COMPLICATIONS: None.  No pneumothorax. FINDINGS: After catheter placement, the tip lies  in the right atrium. The catheter aspirates normally and is ready for immediate use. IMPRESSION: Placement of tunneled hemodialysis catheter via the left internal  jugular vein. The catheter tip lies in the right atrium. The catheter is ready for immediate use. Electronically Signed   By: Aletta Edouard M.D.   On: 09/06/2020 16:12     Microbiology: Recent Results (from the past 240 hour(s))  Resp Panel by RT-PCR (Flu A&B, Covid) Nasopharyngeal Swab     Status: None   Collection Time: 08/28/20  3:54 PM   Specimen: Nasopharyngeal Swab; Nasopharyngeal(NP) swabs in vial transport medium  Result Value Ref Range Status   SARS Coronavirus 2 by RT PCR NEGATIVE NEGATIVE Final    Comment: (NOTE) SARS-CoV-2 target nucleic acids are NOT DETECTED.  The SARS-CoV-2 RNA is generally detectable in upper respiratory specimens during the acute phase of infection. The lowest concentration of SARS-CoV-2 viral copies this assay can detect is 138 copies/mL. A negative result does not preclude SARS-Cov-2 infection and should not be used as the sole basis for treatment or other patient management decisions. A negative result may occur with  improper specimen collection/handling, submission of specimen other than nasopharyngeal swab, presence of viral mutation(s) within the areas targeted by this assay, and inadequate number of viral copies(<138 copies/mL). A negative result must be combined with clinical observations, patient history, and epidemiological information. The expected result is Negative.  Fact Sheet for Patients:  EntrepreneurPulse.com.au  Fact Sheet for Healthcare Providers:  IncredibleEmployment.be  This test is no t yet approved or cleared by the [place] FDA and  has been authorized for detection and/or diagnosis of SARS-CoV-2 by FDA under an Emergency Use Authorization (EUA). This EUA will remain  in effect (meaning this test can be used) for the duration of the COVID-19 declaration under Section 564(b)(1) of the Act, 21 U.S.C.section 360bbb-3(b)(1), unless the authorization is terminated  or revoked  sooner.       Influenza A by PCR NEGATIVE NEGATIVE Final   Influenza B by PCR NEGATIVE NEGATIVE Final    Comment: (NOTE) The Xpert Xpress SARS-CoV-2/FLU/RSV plus assay is intended as an aid in the diagnosis of influenza from Nasopharyngeal swab specimens and should not be used as a sole basis for treatment. Nasal washings and aspirates are unacceptable for Xpert Xpress SARS-CoV-2/FLU/RSV testing.  Fact Sheet for Patients: EntrepreneurPulse.com.au  Fact Sheet for Healthcare Providers: IncredibleEmployment.be  This test is not yet approved or cleared by the [place] FDA and has been authorized for detection and/or diagnosis of SARS-CoV-2 by FDA under an Emergency Use Authorization (EUA). This EUA will remain in effect (meaning this test can be used) for the duration of the COVID-19 declaration under Section 564(b)(1) of the Act, 21 U.S.C. section 360bbb-3(b)(1), unless the authorization is terminated or revoked.  Performed at [facility], [place] [address], [place], [place] [address]   MRSA PCR Screening     Status: None   Collection Time: 08/28/20 11:48 PM   Specimen: Nasopharyngeal  Result Value Ref Range Status   MRSA by PCR NEGATIVE NEGATIVE Final    Comment:        The GeneXpert MRSA Assay (FDA approved for NASAL specimens only), is one component of a comprehensive MRSA colonization surveillance program. It is not intended to diagnose MRSA infection nor to guide or monitor treatment for MRSA infections. Performed at [facility], [place] [address], [place], [place] [address]   Resp Panel by RT-PCR (Flu A&B, Covid) Nasopharyngeal  Swab     Status: None   Collection Time: 09/01/20  1:45 PM   Specimen: Nasopharyngeal Swab; Nasopharyngeal(NP) swabs in vial transport medium  Result Value Ref Range Status   SARS Coronavirus 2 by RT PCR NEGATIVE NEGATIVE Final    Comment: (NOTE) SARS-CoV-2 target nucleic acids are NOT  DETECTED.  The SARS-CoV-2 RNA is generally detectable in upper respiratory specimens during the acute phase of infection. The lowest concentration of SARS-CoV-2 viral copies this assay can detect is 138 copies/mL. A negative result does not preclude SARS-Cov-2 infection and should not be used as the sole basis for treatment or other patient management decisions. A negative result may occur with  improper specimen collection/handling, submission of specimen other than nasopharyngeal swab, presence of viral mutation(s) within the areas targeted by this assay, and inadequate number of viral copies(<138 copies/mL). A negative result must be combined with clinical observations, patient history, and epidemiological information. The expected result is Negative.  Fact Sheet for Patients:  EntrepreneurPulse.com.au  Fact Sheet for Healthcare Providers:  IncredibleEmployment.be  This test is no t yet approved or cleared by the [place] FDA and  has been authorized for detection and/or diagnosis of SARS-CoV-2 by FDA under an Emergency Use Authorization (EUA). This EUA will remain  in effect (meaning this test can be used) for the duration of the COVID-19 declaration under Section 564(b)(1) of the Act, 21 U.S.C.section 360bbb-3(b)(1), unless the authorization is terminated  or revoked sooner.       Influenza A by PCR NEGATIVE NEGATIVE Final   Influenza B by PCR NEGATIVE NEGATIVE Final    Comment: (NOTE) The Xpert Xpress SARS-CoV-2/FLU/RSV plus assay is intended as an aid in the diagnosis of influenza from Nasopharyngeal swab specimens and should not be used as a sole basis for treatment. Nasal washings and aspirates are unacceptable for Xpert Xpress SARS-CoV-2/FLU/RSV testing.  Fact Sheet for Patients: EntrepreneurPulse.com.au  Fact Sheet for Healthcare Providers: IncredibleEmployment.be  This test is not yet  approved or cleared by the [place] FDA and has been authorized for detection and/or diagnosis of SARS-CoV-2 by FDA under an Emergency Use Authorization (EUA). This EUA will remain in effect (meaning this test can be used) for the duration of the COVID-19 declaration under Section 564(b)(1) of the Act, 21 U.S.C. section 360bbb-3(b)(1), unless the authorization is terminated or revoked.  Performed at [facility], [address], [place], [place] [address]   Culture, blood (routine x 2)     Status: Abnormal   Collection Time: 09/01/20  3:00 PM   Specimen: Right Antecubital; Blood  Result Value Ref Range Status   Specimen Description   Final    RIGHT ANTECUBITAL Performed at [facility], [address] [place], [place] [address]    Special Requests   Final    Blood Culture results may not be optimal due to an inadequate volume of blood received in culture bottles BOTTLES DRAWN AEROBIC AND ANAEROBIC Performed at [facility], [address], [place], [place] [address]    Culture  Setup Time   Final    GRAM NEGATIVE RODS Gram Stain Report Called to,Read Back By and Verified With: RACHEAL TATE,RN '@1154'$  09/02/2020 KAY AEROBIC BOTTLE ONLY CRITICAL RESULT CALLED TO, READ BACK BY AND VERIFIED WITH: CRYSTAL REYNOLDS RN '@1625'$  09/02/20 EB Performed at [facility], [place] [address] [place], [place] [address]    Culture PSEUDOMONAS AERUGINOSA (A)  Final   Report Status 09/04/2020 FINAL  Final   Organism ID,  Bacteria PSEUDOMONAS AERUGINOSA  Final      Susceptibility   Pseudomonas aeruginosa - MIC*    CEFTAZIDIME <=1 SENSITIVE Sensitive     CIPROFLOXACIN <=0.25 SENSITIVE Sensitive     GENTAMICIN <=1 SENSITIVE Sensitive     IMIPENEM 1 SENSITIVE Sensitive     PIP/TAZO <=4 SENSITIVE Sensitive     CEFEPIME 1 SENSITIVE Sensitive     * PSEUDOMONAS AERUGINOSA  Blood Culture ID Panel (Reflexed)     Status: Abnormal   Collection Time: 09/01/20  3:00 PM  Result Value Ref Range Status    Enterococcus faecalis NOT DETECTED NOT DETECTED Final   Enterococcus Faecium NOT DETECTED NOT DETECTED Final   Listeria monocytogenes NOT DETECTED NOT DETECTED Final   Staphylococcus species NOT DETECTED NOT DETECTED Final   Staphylococcus aureus (BCID) NOT DETECTED NOT DETECTED Final   Staphylococcus epidermidis NOT DETECTED NOT DETECTED Final   Staphylococcus lugdunensis NOT DETECTED NOT DETECTED Final   Streptococcus species NOT DETECTED NOT DETECTED Final   Streptococcus agalactiae NOT DETECTED NOT DETECTED Final   Streptococcus pneumoniae NOT DETECTED NOT DETECTED Final   Streptococcus pyogenes NOT DETECTED NOT DETECTED Final   A.calcoaceticus-baumannii NOT DETECTED NOT DETECTED Final   Bacteroides fragilis NOT DETECTED NOT DETECTED Final   Enterobacterales NOT DETECTED NOT DETECTED Final   Enterobacter cloacae complex NOT DETECTED NOT DETECTED Final   Escherichia coli NOT DETECTED NOT DETECTED Final   Klebsiella aerogenes NOT DETECTED NOT DETECTED Final   Klebsiella oxytoca NOT DETECTED NOT DETECTED Final   Klebsiella pneumoniae NOT DETECTED NOT DETECTED Final   Proteus species NOT DETECTED NOT DETECTED Final   Salmonella species NOT DETECTED NOT DETECTED Final   Serratia marcescens NOT DETECTED NOT DETECTED Final   Haemophilus influenzae NOT DETECTED NOT DETECTED Final   Neisseria meningitidis NOT DETECTED NOT DETECTED Final   Pseudomonas aeruginosa DETECTED (A) NOT DETECTED Final    Comment: CRITICAL RESULT CALLED TO, READ BACK BY AND VERIFIED WITH: CRYSTAL REYNOLDS RN '@1625'$  09/02/20 EB    Stenotrophomonas maltophilia NOT DETECTED NOT DETECTED Final   Candida albicans NOT DETECTED NOT DETECTED Final   Candida auris NOT DETECTED NOT DETECTED Final   Candida glabrata NOT DETECTED NOT DETECTED Final   Candida krusei NOT DETECTED NOT DETECTED Final   Candida parapsilosis NOT DETECTED NOT DETECTED Final   Candida tropicalis NOT DETECTED NOT DETECTED Final   Cryptococcus  neoformans/gattii NOT DETECTED NOT DETECTED Final   CTX-M ESBL NOT DETECTED NOT DETECTED Final   Carbapenem resistance IMP NOT DETECTED NOT DETECTED Final   Carbapenem resistance KPC NOT DETECTED NOT DETECTED Final   Carbapenem resistance NDM NOT DETECTED NOT DETECTED Final   Carbapenem resistance VIM NOT DETECTED NOT DETECTED Final    Comment: Performed at [facility], [address], [place], [place] [address]  Culture, blood (routine x 2)     Status: Abnormal   Collection Time: 09/01/20  3:49 PM   Specimen: BLOOD LEFT HAND  Result Value Ref Range Status   Specimen Description   Final    BLOOD LEFT HAND Performed at [facility], [address] [place], [place] [address]    Special Requests   Final    Blood Culture adequate volume BOTTLES DRAWN AEROBIC AND ANAEROBIC Performed at [facility], [address] [place], [place] [address]    Culture  Setup Time   Final    GRAM NEGATIVE RODS ANAEROBIC BOTTLE ONLY CRYSTAL REYNOLDS,RN '@1306'$  09/02/2020 KAY ANAEROBIC BOTTLE GRAM NEGATIVE RODS RESULT  PREVIOUSLY CALLED Performed at [facility], [address] [place], [place] [address]    Culture (A)  Final    PSEUDOMONAS AERUGINOSA SUSCEPTIBILITIES PERFORMED ON PREVIOUS CULTURE WITHIN THE LAST 5 DAYS. Performed at [facility], [place] [address] [place], [place] [address]    Report Status 09/04/2020 FINAL  Final  Culture, blood (Routine X 2) w Reflex to ID Panel     Status: None (Preliminary result)   Collection Time: 09/03/20 11:54 PM   Specimen: BLOOD LEFT FOREARM  Result Value Ref Range Status   Specimen Description BLOOD LEFT FOREARM  Final   Special Requests   Final    BOTTLES DRAWN AEROBIC AND ANAEROBIC Blood Culture adequate volume   Culture   Final    NO GROWTH 3 DAYS Performed at [facility], [address], [place], [place] [address]    Report Status PENDING  Incomplete  Culture, blood (Routine X 2) w Reflex to ID Panel     Status: None (Preliminary result)    Collection Time: 09/03/20 11:59 PM   Specimen: BLOOD LEFT HAND  Result Value Ref Range Status   Specimen Description BLOOD LEFT HAND  Final   Special Requests   Final    BOTTLES DRAWN AEROBIC AND ANAEROBIC Blood Culture adequate volume   Culture   Final    NO GROWTH 3 DAYS Performed at [facility], [address], [place], [place] [address]    Report Status PENDING  Incomplete     Labs: Basic Metabolic Panel: Recent Labs  Lab 09/01/20 1500 09/02/20 0617 09/03/20 0914 09/07/20 0601  NA 131* 132* 129* 135  K 3.9 3.3* 3.5 3.9  CL 96* 93* 91* 96*  CO2 '24 23 25 27  '$ GLUCOSE 179* 144* 239* 162*  BUN 51* 30* 54* 46*  CREATININE 11.23* 8.01* 11.37* 10.70*  CALCIUM 8.0* 8.1* 7.8* 8.2*  PHOS  --   --  5.9* 5.6*   Liver Function Tests: Recent Labs  Lab 09/01/20 1500 09/02/20 0617 09/03/20 0914 09/07/20 0601  AST 14* 25  --   --   ALT 17 23  --   --   ALKPHOS 49 52  --   --   BILITOT 0.7 0.9  --   --   PROT 7.4 7.5  --   --   ALBUMIN 3.5 3.6 3.4* 3.6   Recent Labs  Lab 09/01/20 1500  LIPASE 35   No results for input(s): AMMONIA in the last 168 hours. CBC: Recent Labs  Lab 09/01/20 1500 09/02/20 0617 09/03/20 0914 09/07/20 0601  WBC 12.0* 14.9* 10.0 9.4  NEUTROABS 10.0*  --   --   --   HGB 9.2* 9.2* 8.9* 9.5*  HCT 29.8* 30.2* 28.6* 31.0*  MCV 103.5* 103.1* 102.1* 103.7*  PLT 158 153 158 188   Cardiac Enzymes: No results for input(s): CKTOTAL, CKMB, CKMBINDEX, TROPONINI in the last 168 hours. BNP: Invalid input(s): POCBNP CBG: Recent Labs  Lab 09/05/20 1718 09/05/20 2205 09/06/20 0728 09/06/20 2112 09/07/20 0755  GLUCAP 175* 217* 161* 138* 157*    Time coordinating discharge:  36 minutes  Signed:  Orson Eva, DO [facility] Pager: [phone] 09/07/2020, 11:14 AM

## 2020-09-07 NOTE — Care Management Important Message (Signed)
Important Message  Patient Details  Name: Courtney Gray MRN: [id number] Date of Birth: [birth year]   Medicare Important Message Given:  Yes  Late entry, given on 09/06/20   Tommy Medal 09/07/2020, 11:28 AM

## 2020-09-08 DIAGNOSIS — N2581 Secondary hyperparathyroidism of renal origin: Secondary | ICD-10-CM | POA: Diagnosis not present

## 2020-09-08 DIAGNOSIS — N186 End stage renal disease: Secondary | ICD-10-CM | POA: Diagnosis not present

## 2020-09-08 DIAGNOSIS — Z992 Dependence on renal dialysis: Secondary | ICD-10-CM | POA: Diagnosis not present

## 2020-09-09 LAB — CULTURE, BLOOD (ROUTINE X 2)
Culture: NO GROWTH
Culture: NO GROWTH
Special Requests: ADEQUATE
Special Requests: ADEQUATE

## 2020-09-11 DIAGNOSIS — Z992 Dependence on renal dialysis: Secondary | ICD-10-CM | POA: Diagnosis not present

## 2020-09-11 DIAGNOSIS — N2581 Secondary hyperparathyroidism of renal origin: Secondary | ICD-10-CM | POA: Diagnosis not present

## 2020-09-11 DIAGNOSIS — N186 End stage renal disease: Secondary | ICD-10-CM | POA: Diagnosis not present

## 2020-09-12 DIAGNOSIS — J9691 Respiratory failure, unspecified with hypoxia: Secondary | ICD-10-CM | POA: Diagnosis not present

## 2020-09-13 DIAGNOSIS — Z992 Dependence on renal dialysis: Secondary | ICD-10-CM | POA: Diagnosis not present

## 2020-09-13 DIAGNOSIS — E559 Vitamin D deficiency, unspecified: Secondary | ICD-10-CM | POA: Diagnosis not present

## 2020-09-13 DIAGNOSIS — N2581 Secondary hyperparathyroidism of renal origin: Secondary | ICD-10-CM | POA: Diagnosis not present

## 2020-09-13 DIAGNOSIS — N186 End stage renal disease: Secondary | ICD-10-CM | POA: Diagnosis not present

## 2020-09-15 DIAGNOSIS — N2581 Secondary hyperparathyroidism of renal origin: Secondary | ICD-10-CM | POA: Diagnosis not present

## 2020-09-15 DIAGNOSIS — Z992 Dependence on renal dialysis: Secondary | ICD-10-CM | POA: Diagnosis not present

## 2020-09-15 DIAGNOSIS — N186 End stage renal disease: Secondary | ICD-10-CM | POA: Diagnosis not present

## 2020-09-18 DIAGNOSIS — Z992 Dependence on renal dialysis: Secondary | ICD-10-CM | POA: Diagnosis not present

## 2020-09-18 DIAGNOSIS — N2581 Secondary hyperparathyroidism of renal origin: Secondary | ICD-10-CM | POA: Diagnosis not present

## 2020-09-18 DIAGNOSIS — N186 End stage renal disease: Secondary | ICD-10-CM | POA: Diagnosis not present

## 2020-09-20 DIAGNOSIS — Z992 Dependence on renal dialysis: Secondary | ICD-10-CM | POA: Diagnosis not present

## 2020-09-20 DIAGNOSIS — N186 End stage renal disease: Secondary | ICD-10-CM | POA: Diagnosis not present

## 2020-09-20 DIAGNOSIS — N2581 Secondary hyperparathyroidism of renal origin: Secondary | ICD-10-CM | POA: Diagnosis not present

## 2020-09-22 DIAGNOSIS — N186 End stage renal disease: Secondary | ICD-10-CM | POA: Diagnosis not present

## 2020-09-22 DIAGNOSIS — Z992 Dependence on renal dialysis: Secondary | ICD-10-CM | POA: Diagnosis not present

## 2020-09-22 DIAGNOSIS — N2581 Secondary hyperparathyroidism of renal origin: Secondary | ICD-10-CM | POA: Diagnosis not present

## 2020-09-25 DIAGNOSIS — N186 End stage renal disease: Secondary | ICD-10-CM | POA: Diagnosis not present

## 2020-09-25 DIAGNOSIS — N2581 Secondary hyperparathyroidism of renal origin: Secondary | ICD-10-CM | POA: Diagnosis not present

## 2020-09-25 DIAGNOSIS — Z992 Dependence on renal dialysis: Secondary | ICD-10-CM | POA: Diagnosis not present

## 2020-09-27 DIAGNOSIS — N2581 Secondary hyperparathyroidism of renal origin: Secondary | ICD-10-CM | POA: Diagnosis not present

## 2020-09-27 DIAGNOSIS — Z992 Dependence on renal dialysis: Secondary | ICD-10-CM | POA: Diagnosis not present

## 2020-09-27 DIAGNOSIS — N186 End stage renal disease: Secondary | ICD-10-CM | POA: Diagnosis not present

## 2020-09-29 DIAGNOSIS — N186 End stage renal disease: Secondary | ICD-10-CM | POA: Diagnosis not present

## 2020-09-29 DIAGNOSIS — N2581 Secondary hyperparathyroidism of renal origin: Secondary | ICD-10-CM | POA: Diagnosis not present

## 2020-09-29 DIAGNOSIS — Z992 Dependence on renal dialysis: Secondary | ICD-10-CM | POA: Diagnosis not present

## 2020-10-01 DIAGNOSIS — N186 End stage renal disease: Secondary | ICD-10-CM | POA: Diagnosis not present

## 2020-10-01 DIAGNOSIS — Z299 Encounter for prophylactic measures, unspecified: Secondary | ICD-10-CM | POA: Diagnosis not present

## 2020-10-01 DIAGNOSIS — A498 Other bacterial infections of unspecified site: Secondary | ICD-10-CM | POA: Diagnosis not present

## 2020-10-01 DIAGNOSIS — E1165 Type 2 diabetes mellitus with hyperglycemia: Secondary | ICD-10-CM | POA: Diagnosis not present

## 2020-10-01 DIAGNOSIS — E1122 Type 2 diabetes mellitus with diabetic chronic kidney disease: Secondary | ICD-10-CM | POA: Diagnosis not present

## 2020-10-02 DIAGNOSIS — E119 Type 2 diabetes mellitus without complications: Secondary | ICD-10-CM | POA: Diagnosis not present

## 2020-10-02 DIAGNOSIS — D509 Iron deficiency anemia, unspecified: Secondary | ICD-10-CM | POA: Diagnosis not present

## 2020-10-02 DIAGNOSIS — Z992 Dependence on renal dialysis: Secondary | ICD-10-CM | POA: Diagnosis not present

## 2020-10-02 DIAGNOSIS — N186 End stage renal disease: Secondary | ICD-10-CM | POA: Diagnosis not present

## 2020-10-02 DIAGNOSIS — N2581 Secondary hyperparathyroidism of renal origin: Secondary | ICD-10-CM | POA: Diagnosis not present

## 2020-10-04 DIAGNOSIS — E785 Hyperlipidemia, unspecified: Secondary | ICD-10-CM | POA: Diagnosis not present

## 2020-10-04 DIAGNOSIS — N186 End stage renal disease: Secondary | ICD-10-CM | POA: Diagnosis not present

## 2020-10-04 DIAGNOSIS — R5383 Other fatigue: Secondary | ICD-10-CM | POA: Diagnosis not present

## 2020-10-04 DIAGNOSIS — N2581 Secondary hyperparathyroidism of renal origin: Secondary | ICD-10-CM | POA: Diagnosis not present

## 2020-10-04 DIAGNOSIS — Z992 Dependence on renal dialysis: Secondary | ICD-10-CM | POA: Diagnosis not present

## 2020-10-06 DIAGNOSIS — E8770 Fluid overload, unspecified: Secondary | ICD-10-CM | POA: Diagnosis not present

## 2020-10-06 DIAGNOSIS — Z992 Dependence on renal dialysis: Secondary | ICD-10-CM | POA: Diagnosis not present

## 2020-10-06 DIAGNOSIS — N186 End stage renal disease: Secondary | ICD-10-CM | POA: Diagnosis not present

## 2020-10-06 DIAGNOSIS — N2581 Secondary hyperparathyroidism of renal origin: Secondary | ICD-10-CM | POA: Diagnosis not present

## 2020-10-09 DIAGNOSIS — N2581 Secondary hyperparathyroidism of renal origin: Secondary | ICD-10-CM | POA: Diagnosis not present

## 2020-10-09 DIAGNOSIS — Z992 Dependence on renal dialysis: Secondary | ICD-10-CM | POA: Diagnosis not present

## 2020-10-09 DIAGNOSIS — E8770 Fluid overload, unspecified: Secondary | ICD-10-CM | POA: Diagnosis not present

## 2020-10-09 DIAGNOSIS — N186 End stage renal disease: Secondary | ICD-10-CM | POA: Diagnosis not present

## 2020-10-11 DIAGNOSIS — N186 End stage renal disease: Secondary | ICD-10-CM | POA: Diagnosis not present

## 2020-10-11 DIAGNOSIS — N2581 Secondary hyperparathyroidism of renal origin: Secondary | ICD-10-CM | POA: Diagnosis not present

## 2020-10-11 DIAGNOSIS — Z992 Dependence on renal dialysis: Secondary | ICD-10-CM | POA: Diagnosis not present

## 2020-10-11 DIAGNOSIS — E8770 Fluid overload, unspecified: Secondary | ICD-10-CM | POA: Diagnosis not present

## 2020-10-12 DIAGNOSIS — J9691 Respiratory failure, unspecified with hypoxia: Secondary | ICD-10-CM | POA: Diagnosis not present

## 2020-10-13 DIAGNOSIS — Z992 Dependence on renal dialysis: Secondary | ICD-10-CM | POA: Diagnosis not present

## 2020-10-13 DIAGNOSIS — N2581 Secondary hyperparathyroidism of renal origin: Secondary | ICD-10-CM | POA: Diagnosis not present

## 2020-10-13 DIAGNOSIS — N186 End stage renal disease: Secondary | ICD-10-CM | POA: Diagnosis not present

## 2020-10-13 DIAGNOSIS — E8770 Fluid overload, unspecified: Secondary | ICD-10-CM | POA: Diagnosis not present

## 2020-10-16 DIAGNOSIS — Z992 Dependence on renal dialysis: Secondary | ICD-10-CM | POA: Diagnosis not present

## 2020-10-16 DIAGNOSIS — N2581 Secondary hyperparathyroidism of renal origin: Secondary | ICD-10-CM | POA: Diagnosis not present

## 2020-10-16 DIAGNOSIS — E8770 Fluid overload, unspecified: Secondary | ICD-10-CM | POA: Diagnosis not present

## 2020-10-16 DIAGNOSIS — N186 End stage renal disease: Secondary | ICD-10-CM | POA: Diagnosis not present

## 2020-10-18 DIAGNOSIS — Z992 Dependence on renal dialysis: Secondary | ICD-10-CM | POA: Diagnosis not present

## 2020-10-18 DIAGNOSIS — N186 End stage renal disease: Secondary | ICD-10-CM | POA: Diagnosis not present

## 2020-10-18 DIAGNOSIS — E8770 Fluid overload, unspecified: Secondary | ICD-10-CM | POA: Diagnosis not present

## 2020-10-18 DIAGNOSIS — N2581 Secondary hyperparathyroidism of renal origin: Secondary | ICD-10-CM | POA: Diagnosis not present

## 2020-10-20 DIAGNOSIS — E8770 Fluid overload, unspecified: Secondary | ICD-10-CM | POA: Diagnosis not present

## 2020-10-20 DIAGNOSIS — N186 End stage renal disease: Secondary | ICD-10-CM | POA: Diagnosis not present

## 2020-10-20 DIAGNOSIS — N2581 Secondary hyperparathyroidism of renal origin: Secondary | ICD-10-CM | POA: Diagnosis not present

## 2020-10-20 DIAGNOSIS — Z992 Dependence on renal dialysis: Secondary | ICD-10-CM | POA: Diagnosis not present

## 2020-10-21 ENCOUNTER — Other Ambulatory Visit: Payer: Self-pay

## 2020-10-21 ENCOUNTER — Emergency Department (HOSPITAL_COMMUNITY)
Admission: EM | Admit: 2020-10-21 | Discharge: 2020-10-21 | Disposition: A | Discharge status: Home / Self Care | Payer: Medicare Other | Attending: Emergency Medicine | Admitting: Emergency Medicine

## 2020-10-21 ENCOUNTER — Encounter (HOSPITAL_COMMUNITY): Payer: Self-pay

## 2020-10-21 ENCOUNTER — Emergency Department (HOSPITAL_COMMUNITY): Payer: Medicare Other

## 2020-10-21 DIAGNOSIS — R6889 Other general symptoms and signs: Secondary | ICD-10-CM | POA: Diagnosis not present

## 2020-10-21 DIAGNOSIS — I517 Cardiomegaly: Secondary | ICD-10-CM | POA: Diagnosis not present

## 2020-10-21 DIAGNOSIS — I120 Hypertensive chronic kidney disease with stage 5 chronic kidney disease or end stage renal disease: Secondary | ICD-10-CM | POA: Insufficient documentation

## 2020-10-21 DIAGNOSIS — N186 End stage renal disease: Secondary | ICD-10-CM | POA: Diagnosis not present

## 2020-10-21 DIAGNOSIS — Z95828 Presence of other vascular implants and grafts: Secondary | ICD-10-CM

## 2020-10-21 DIAGNOSIS — T82594A Other mechanical complication of infusion catheter, initial encounter: Secondary | ICD-10-CM | POA: Diagnosis not present

## 2020-10-21 DIAGNOSIS — Z7982 Long term (current) use of aspirin: Secondary | ICD-10-CM | POA: Diagnosis not present

## 2020-10-21 DIAGNOSIS — Z79899 Other long term (current) drug therapy: Secondary | ICD-10-CM | POA: Insufficient documentation

## 2020-10-21 DIAGNOSIS — Y713 Surgical instruments, materials and cardiovascular devices (including sutures) associated with adverse incidents: Secondary | ICD-10-CM | POA: Insufficient documentation

## 2020-10-21 DIAGNOSIS — R0902 Hypoxemia: Secondary | ICD-10-CM | POA: Diagnosis not present

## 2020-10-21 DIAGNOSIS — Z978 Presence of other specified devices: Secondary | ICD-10-CM

## 2020-10-21 DIAGNOSIS — Z794 Long term (current) use of insulin: Secondary | ICD-10-CM | POA: Insufficient documentation

## 2020-10-21 DIAGNOSIS — Z743 Need for continuous supervision: Secondary | ICD-10-CM | POA: Diagnosis not present

## 2020-10-21 DIAGNOSIS — E1122 Type 2 diabetes mellitus with diabetic chronic kidney disease: Secondary | ICD-10-CM | POA: Insufficient documentation

## 2020-10-21 DIAGNOSIS — Z452 Encounter for adjustment and management of vascular access device: Secondary | ICD-10-CM | POA: Diagnosis not present

## 2020-10-21 DIAGNOSIS — R739 Hyperglycemia, unspecified: Secondary | ICD-10-CM | POA: Diagnosis not present

## 2020-10-21 DIAGNOSIS — D631 Anemia in chronic kidney disease: Secondary | ICD-10-CM | POA: Insufficient documentation

## 2020-10-21 DIAGNOSIS — Z992 Dependence on renal dialysis: Secondary | ICD-10-CM | POA: Insufficient documentation

## 2020-10-21 DIAGNOSIS — I10 Essential (primary) hypertension: Secondary | ICD-10-CM | POA: Diagnosis not present

## 2020-10-21 NOTE — ED Triage Notes (Signed)
Pt arrives via [facility] for bleeding to dialysis port to left chest. Pt says she might have scratched it while she was hot and site was itching. No active bleeding present. Stitches intact to top and bottom of catheter.

## 2020-10-21 NOTE — ED Provider Notes (Signed)
[facility] EMERGENCY DEPARTMENT Provider Note   CSN: [id number] Arrival date & time: 10/21/20  0205     History Chief Complaint  Patient presents with   Vascular Access Problem    Courtney Gray is a 50 y.o. female.  Patient with a tunneled catheter in her left subclavian.  Said it was itching tonight and she was scratching.  Shortly afterwards she started having oozing of blood per       Past Medical History:  Diagnosis Date   Diabetes mellitus without complication ([place])    High cholesterol    Hypertension    Renal disorder     Patient Active Problem List   Diagnosis Date Noted   Bacteremia    Fever 09/01/2020   HTN (hypertension) 09/01/2020   ESRD (end stage renal disease) ([place]) 09/01/2020   Chronic respiratory failure with hypoxia ([place]) 09/01/2020   Respiratory distress    Hypertensive emergency    Respiratory failure ([place]) 08/28/2020   Acute respiratory failure with hypoxia ([place]) 11/28/2019   Fluid overload 11/28/2019   Hypertensive urgency 11/28/2019   Type 2 diabetes mellitus with other specified complication ([place]) [id number]   Anemia of chronic disease 11/28/2019   Morbid obesity ([place]) 11/28/2019    Past Surgical History:  Procedure Laterality Date   AV FISTULA PLACEMENT     x2, unsuccessful.    IR FLUORO GUIDE CV LINE RIGHT  08/28/2020   IR PERC TUN PERIT CATH WO PORT S&I /IMAG  09/06/2020   IR US GUIDE VASC ACCESS LEFT  09/06/2020     OB History     Gravida  2   Para  2   Term  2   Preterm      AB      Living  2      SAB      IAB      Ectopic      Multiple      Live Births              Family History  Problem Relation Age of Onset   Diabetes Mother    Diabetes Father     Social History   Tobacco Use   Smoking status: Never   Smokeless tobacco: Never  Vaping Use   Vaping Use: Never used  Substance Use Topics   Alcohol use: Never   Drug use: Never    Home Medications Prior to Admission medications    Medication Sig Start Date End Date Taking? Authorizing Provider  acetaminophen (TYLENOL) 500 MG tablet Take 1,000 mg by mouth every 6 (six) hours as needed for moderate pain or headache.    [provider]  amLODipine (NORVASC) 10 MG tablet Take 10 mg by mouth See admin instructions. Every other day (on dialysis days) if blood pressure is in the 200's. 03/27/18   [provider]  aspirin EC 81 MG tablet Take 81 mg by mouth daily.    [provider]  atorvastatin (LIPITOR) 10 MG tablet Take 10 mg by mouth daily. 03/29/19   [provider]  carvedilol (COREG) 25 MG tablet Take 25 mg by mouth 2 (two) times daily. 04/20/19   [provider]  cinacalcet (SENSIPAR) 30 MG tablet Take 1 tablet (30 mg total) by mouth Every Tuesday,Thursday,and Saturday with dialysis. 09/01/20 11/30/20  Enzo Bi, MD  diphenhydrAMINE (BENADRYL) 25 MG tablet Take 50 mg by mouth daily as needed for allergies.    [provider]  docusate sodium (COLACE) 100  MG capsule Take 100 mg by mouth daily.    [provider]  doxercalciferol (HECTOROL) 4 MCG/2ML injection Inject 2.5 mLs (5 mcg total) into the vein Every Tuesday,Thursday,and Saturday with dialysis. 09/01/20   Enzo Bi, MD  famotidine (PEPCID) 20 MG tablet Take 20 mg by mouth daily as needed for heartburn or indigestion. Patient not taking: Reported on 09/01/2020    [provider]  gabapentin (NEURONTIN) 100 MG capsule Take 100-200 mg by mouth See admin instructions. Take 100 mg twice daily on non dialysis days.  Take 100 mg at 9am before dialysis, '100mg'$  after dialysis at 5pm, and '100mg'$  at 1am on Tuesdays, Thursdays,and Saturdays. 04/20/19   [provider]  LANTUS SOLOSTAR 100 UNIT/ML Solostar Pen Inject 12 Units into the skin at bedtime.  03/18/19   [provider]  lisinopril (ZESTRIL) 10 MG tablet Take 1 tablet (10 mg total) by mouth daily. 09/08/20   Orson Eva, MD  nitroGLYCERIN  (NITROSTAT) 0.4 MG SL tablet Place 0.4 mg under the tongue every 5 (five) minutes as needed for chest pain.    [provider]  Simethicone (GAS-X PO) Take 1 tablet by mouth daily as needed (gas).    [provider]  sodium chloride (OCEAN) 0.65 % SOLN nasal spray Place 1 spray into both nostrils in the morning and at bedtime.    [provider]  tiZANidine (ZANAFLEX) 4 MG tablet Take 4 mg by mouth every 8 (eight) hours.  04/20/19   [provider]    Allergies    Benzonatate, Hydralazine, Amoxicillin, Clonidine, Morphine, and Oxycodone  Review of Systems   Review of Systems  All other systems reviewed and are negative.  Physical Exam Updated Vital Signs BP (!) 217/78 (BP Location: Left Wrist)   Pulse 89   Temp 98.6 F (37 C)   Resp 19   SpO2 98%   Physical Exam Vitals and nursing note reviewed.  Constitutional:      Appearance: She is well-developed.  HENT:     Head: Normocephalic and atraumatic.     Mouth/Throat:     Mouth: Mucous membranes are moist.     Pharynx: Oropharynx is clear.  Eyes:     Pupils: Pupils are equal, round, and reactive to light.  Cardiovascular:     Rate and Rhythm: Normal rate and regular rhythm.  Pulmonary:     Effort: No respiratory distress.     Breath sounds: No stridor.  Abdominal:     General: There is no distension.  Musculoskeletal:        General: No swelling or tenderness.     Cervical back: Normal range of motion.  Skin:    General: Skin is warm and dry.     Comments: Dried blood around vascath insertion site. Cath intact. Sutures intact. Appears to be at likely appropriate positioning.   Neurological:     General: No focal deficit present.     Mental Status: She is alert.    ED Results / Procedures / Treatments   Labs (all labs ordered are listed, but only abnormal results are displayed) Labs Reviewed - No data to display  EKG None  Radiology DG Chest 2 View  Result Date:  10/21/2020 CLINICAL DATA:  Pericatheter hemorrhage EXAM: CHEST - 2 VIEW COMPARISON:  09/03/2020 FINDINGS: Lungs are clear. No pneumothorax or pleural effusion. Mild cardiomegaly is stable. Left internal jugular hemodialysis catheter tips noted within the superior right atrium. Pulmonary vascularity is normal. No acute bone  abnormality. IMPRESSION: No active cardiopulmonary disease. Left internal jugular dialysis catheter in appropriate position. Stable cardiomegaly. Electronically Signed   By: Fidela Salisbury MD   On: 10/21/2020 03:43    Procedures Procedures   Medications Ordered in ED Medications - No data to display  ED Course  I have reviewed the triage vital signs and the nursing notes.  Pertinent labs & imaging results that were available during my care of the patient were reviewed by me and considered in my medical decision making (see chart for details).    MDM Rules/Calculators/A&P                          Observed for 4 hours without rebleeding after bandage placed. Suspect it was from being loosened up when she scratched it. Very low suspicion for cahteter malfunction or vascular bleed.  Cath intact on xr.  Stable for discharge.   Final Clinical Impression(s) / ED Diagnoses Final diagnoses:  Port-A-Cath in place  Central vascular catheter in place upon arrival    Rx / DC Orders ED Discharge Orders     None        Danetta Prom, Corene Cornea, MD 10/21/20 [phone]

## 2020-10-22 DIAGNOSIS — E8770 Fluid overload, unspecified: Secondary | ICD-10-CM | POA: Diagnosis not present

## 2020-10-22 DIAGNOSIS — Z992 Dependence on renal dialysis: Secondary | ICD-10-CM | POA: Diagnosis not present

## 2020-10-22 DIAGNOSIS — N186 End stage renal disease: Secondary | ICD-10-CM | POA: Diagnosis not present

## 2020-10-22 DIAGNOSIS — N2581 Secondary hyperparathyroidism of renal origin: Secondary | ICD-10-CM | POA: Diagnosis not present

## 2020-10-23 DIAGNOSIS — N2581 Secondary hyperparathyroidism of renal origin: Secondary | ICD-10-CM | POA: Diagnosis not present

## 2020-10-23 DIAGNOSIS — N186 End stage renal disease: Secondary | ICD-10-CM | POA: Diagnosis not present

## 2020-10-23 DIAGNOSIS — E8770 Fluid overload, unspecified: Secondary | ICD-10-CM | POA: Diagnosis not present

## 2020-10-23 DIAGNOSIS — Z992 Dependence on renal dialysis: Secondary | ICD-10-CM | POA: Diagnosis not present

## 2020-10-25 ENCOUNTER — Telehealth: Payer: Self-pay | Admitting: *Deleted

## 2020-10-25 DIAGNOSIS — N2581 Secondary hyperparathyroidism of renal origin: Secondary | ICD-10-CM | POA: Diagnosis not present

## 2020-10-25 DIAGNOSIS — N186 End stage renal disease: Secondary | ICD-10-CM | POA: Diagnosis not present

## 2020-10-25 DIAGNOSIS — Z992 Dependence on renal dialysis: Secondary | ICD-10-CM | POA: Diagnosis not present

## 2020-10-25 DIAGNOSIS — E8770 Fluid overload, unspecified: Secondary | ICD-10-CM | POA: Diagnosis not present

## 2020-10-25 NOTE — Telephone Encounter (Signed)
Received call from Ramiro Harvest PA about scheduling an appt for pt. Pt had AVF placed in 2018 that has never been used. In aug 2021 pt had resp distress during fistulogram and the procedure was aborted. Pt has been using [facility] for dialysis and has been reluctant to pursue perm access. She is now willing to discuss perm access. I have scheduled her an appt with Dr. Donzetta Matters.

## 2020-10-27 DIAGNOSIS — Z992 Dependence on renal dialysis: Secondary | ICD-10-CM | POA: Diagnosis not present

## 2020-10-27 DIAGNOSIS — E8770 Fluid overload, unspecified: Secondary | ICD-10-CM | POA: Diagnosis not present

## 2020-10-27 DIAGNOSIS — N2581 Secondary hyperparathyroidism of renal origin: Secondary | ICD-10-CM | POA: Diagnosis not present

## 2020-10-27 DIAGNOSIS — N186 End stage renal disease: Secondary | ICD-10-CM | POA: Diagnosis not present

## 2020-10-30 DIAGNOSIS — N2581 Secondary hyperparathyroidism of renal origin: Secondary | ICD-10-CM | POA: Diagnosis not present

## 2020-10-30 DIAGNOSIS — E8770 Fluid overload, unspecified: Secondary | ICD-10-CM | POA: Diagnosis not present

## 2020-10-30 DIAGNOSIS — N186 End stage renal disease: Secondary | ICD-10-CM | POA: Diagnosis not present

## 2020-10-30 DIAGNOSIS — D509 Iron deficiency anemia, unspecified: Secondary | ICD-10-CM | POA: Diagnosis not present

## 2020-10-30 DIAGNOSIS — Z992 Dependence on renal dialysis: Secondary | ICD-10-CM | POA: Diagnosis not present

## 2020-11-01 DIAGNOSIS — N186 End stage renal disease: Secondary | ICD-10-CM | POA: Diagnosis not present

## 2020-11-01 DIAGNOSIS — N2581 Secondary hyperparathyroidism of renal origin: Secondary | ICD-10-CM | POA: Diagnosis not present

## 2020-11-01 DIAGNOSIS — E8770 Fluid overload, unspecified: Secondary | ICD-10-CM | POA: Diagnosis not present

## 2020-11-01 DIAGNOSIS — Z992 Dependence on renal dialysis: Secondary | ICD-10-CM | POA: Diagnosis not present

## 2020-11-03 DIAGNOSIS — Z992 Dependence on renal dialysis: Secondary | ICD-10-CM | POA: Diagnosis not present

## 2020-11-03 DIAGNOSIS — N186 End stage renal disease: Secondary | ICD-10-CM | POA: Diagnosis not present

## 2020-11-03 DIAGNOSIS — N2581 Secondary hyperparathyroidism of renal origin: Secondary | ICD-10-CM | POA: Diagnosis not present

## 2020-11-03 DIAGNOSIS — E8770 Fluid overload, unspecified: Secondary | ICD-10-CM | POA: Diagnosis not present

## 2020-11-04 DIAGNOSIS — Z992 Dependence on renal dialysis: Secondary | ICD-10-CM | POA: Diagnosis not present

## 2020-11-04 DIAGNOSIS — E785 Hyperlipidemia, unspecified: Secondary | ICD-10-CM | POA: Diagnosis not present

## 2020-11-04 DIAGNOSIS — R5383 Other fatigue: Secondary | ICD-10-CM | POA: Diagnosis not present

## 2020-11-04 DIAGNOSIS — N186 End stage renal disease: Secondary | ICD-10-CM | POA: Diagnosis not present

## 2020-11-06 DIAGNOSIS — Z992 Dependence on renal dialysis: Secondary | ICD-10-CM | POA: Diagnosis not present

## 2020-11-06 DIAGNOSIS — N2581 Secondary hyperparathyroidism of renal origin: Secondary | ICD-10-CM | POA: Diagnosis not present

## 2020-11-06 DIAGNOSIS — N186 End stage renal disease: Secondary | ICD-10-CM | POA: Diagnosis not present

## 2020-11-08 DIAGNOSIS — N186 End stage renal disease: Secondary | ICD-10-CM | POA: Diagnosis not present

## 2020-11-08 DIAGNOSIS — N2581 Secondary hyperparathyroidism of renal origin: Secondary | ICD-10-CM | POA: Diagnosis not present

## 2020-11-08 DIAGNOSIS — Z992 Dependence on renal dialysis: Secondary | ICD-10-CM | POA: Diagnosis not present

## 2020-11-10 DIAGNOSIS — Z992 Dependence on renal dialysis: Secondary | ICD-10-CM | POA: Diagnosis not present

## 2020-11-10 DIAGNOSIS — N186 End stage renal disease: Secondary | ICD-10-CM | POA: Diagnosis not present

## 2020-11-10 DIAGNOSIS — N2581 Secondary hyperparathyroidism of renal origin: Secondary | ICD-10-CM | POA: Diagnosis not present

## 2020-11-12 DIAGNOSIS — J9691 Respiratory failure, unspecified with hypoxia: Secondary | ICD-10-CM | POA: Diagnosis not present

## 2020-11-13 DIAGNOSIS — Z992 Dependence on renal dialysis: Secondary | ICD-10-CM | POA: Diagnosis not present

## 2020-11-13 DIAGNOSIS — N2581 Secondary hyperparathyroidism of renal origin: Secondary | ICD-10-CM | POA: Diagnosis not present

## 2020-11-13 DIAGNOSIS — N186 End stage renal disease: Secondary | ICD-10-CM | POA: Diagnosis not present

## 2020-11-15 DIAGNOSIS — N186 End stage renal disease: Secondary | ICD-10-CM | POA: Diagnosis not present

## 2020-11-15 DIAGNOSIS — N2581 Secondary hyperparathyroidism of renal origin: Secondary | ICD-10-CM | POA: Diagnosis not present

## 2020-11-15 DIAGNOSIS — Z992 Dependence on renal dialysis: Secondary | ICD-10-CM | POA: Diagnosis not present

## 2020-11-17 DIAGNOSIS — N2581 Secondary hyperparathyroidism of renal origin: Secondary | ICD-10-CM | POA: Diagnosis not present

## 2020-11-17 DIAGNOSIS — Z992 Dependence on renal dialysis: Secondary | ICD-10-CM | POA: Diagnosis not present

## 2020-11-17 DIAGNOSIS — N186 End stage renal disease: Secondary | ICD-10-CM | POA: Diagnosis not present

## 2020-11-20 DIAGNOSIS — N2581 Secondary hyperparathyroidism of renal origin: Secondary | ICD-10-CM | POA: Diagnosis not present

## 2020-11-20 DIAGNOSIS — Z992 Dependence on renal dialysis: Secondary | ICD-10-CM | POA: Diagnosis not present

## 2020-11-20 DIAGNOSIS — N186 End stage renal disease: Secondary | ICD-10-CM | POA: Diagnosis not present

## 2020-11-22 DIAGNOSIS — N2581 Secondary hyperparathyroidism of renal origin: Secondary | ICD-10-CM | POA: Diagnosis not present

## 2020-11-22 DIAGNOSIS — Z992 Dependence on renal dialysis: Secondary | ICD-10-CM | POA: Diagnosis not present

## 2020-11-22 DIAGNOSIS — N186 End stage renal disease: Secondary | ICD-10-CM | POA: Diagnosis not present

## 2020-11-23 ENCOUNTER — Ambulatory Visit: Payer: Medicaid Other | Admitting: Vascular Surgery

## 2020-11-24 DIAGNOSIS — Z992 Dependence on renal dialysis: Secondary | ICD-10-CM | POA: Diagnosis not present

## 2020-11-24 DIAGNOSIS — N186 End stage renal disease: Secondary | ICD-10-CM | POA: Diagnosis not present

## 2020-11-24 DIAGNOSIS — N2581 Secondary hyperparathyroidism of renal origin: Secondary | ICD-10-CM | POA: Diagnosis not present

## 2020-11-26 ENCOUNTER — Other Ambulatory Visit: Payer: Self-pay

## 2020-11-26 ENCOUNTER — Other Ambulatory Visit (HOSPITAL_COMMUNITY): Payer: Medicaid Other

## 2020-11-26 ENCOUNTER — Emergency Department (HOSPITAL_COMMUNITY): Payer: Medicare Other

## 2020-11-26 ENCOUNTER — Inpatient Hospital Stay (HOSPITAL_COMMUNITY)
Admission: EM | Admit: 2020-11-26 | Discharge: 2020-11-28 | DRG: 640 | Disposition: A | Discharge status: Home / Self Care | Payer: Medicare Other | Attending: Family Medicine | Admitting: Family Medicine

## 2020-11-26 DIAGNOSIS — Z794 Long term (current) use of insulin: Secondary | ICD-10-CM | POA: Diagnosis not present

## 2020-11-26 DIAGNOSIS — R0602 Shortness of breath: Secondary | ICD-10-CM | POA: Diagnosis not present

## 2020-11-26 DIAGNOSIS — Z9981 Dependence on supplemental oxygen: Secondary | ICD-10-CM | POA: Diagnosis not present

## 2020-11-26 DIAGNOSIS — I248 Other forms of acute ischemic heart disease: Secondary | ICD-10-CM | POA: Diagnosis not present

## 2020-11-26 DIAGNOSIS — Z833 Family history of diabetes mellitus: Secondary | ICD-10-CM

## 2020-11-26 DIAGNOSIS — R059 Cough, unspecified: Secondary | ICD-10-CM | POA: Diagnosis not present

## 2020-11-26 DIAGNOSIS — D631 Anemia in chronic kidney disease: Secondary | ICD-10-CM | POA: Diagnosis present

## 2020-11-26 DIAGNOSIS — N2581 Secondary hyperparathyroidism of renal origin: Secondary | ICD-10-CM | POA: Diagnosis present

## 2020-11-26 DIAGNOSIS — Z20822 Contact with and (suspected) exposure to covid-19: Secondary | ICD-10-CM | POA: Diagnosis not present

## 2020-11-26 DIAGNOSIS — J9601 Acute respiratory failure with hypoxia: Secondary | ICD-10-CM | POA: Diagnosis not present

## 2020-11-26 DIAGNOSIS — Z79899 Other long term (current) drug therapy: Secondary | ICD-10-CM

## 2020-11-26 DIAGNOSIS — N250 Renal osteodystrophy: Secondary | ICD-10-CM | POA: Diagnosis not present

## 2020-11-26 DIAGNOSIS — Z992 Dependence on renal dialysis: Secondary | ICD-10-CM

## 2020-11-26 DIAGNOSIS — Z7982 Long term (current) use of aspirin: Secondary | ICD-10-CM

## 2020-11-26 DIAGNOSIS — R0989 Other specified symptoms and signs involving the circulatory and respiratory systems: Secondary | ICD-10-CM

## 2020-11-26 DIAGNOSIS — I120 Hypertensive chronic kidney disease with stage 5 chronic kidney disease or end stage renal disease: Secondary | ICD-10-CM | POA: Diagnosis present

## 2020-11-26 DIAGNOSIS — R0689 Other abnormalities of breathing: Secondary | ICD-10-CM | POA: Diagnosis not present

## 2020-11-26 DIAGNOSIS — E1140 Type 2 diabetes mellitus with diabetic neuropathy, unspecified: Secondary | ICD-10-CM | POA: Diagnosis not present

## 2020-11-26 DIAGNOSIS — E8770 Fluid overload, unspecified: Principal | ICD-10-CM | POA: Diagnosis present

## 2020-11-26 DIAGNOSIS — J811 Chronic pulmonary edema: Secondary | ICD-10-CM | POA: Diagnosis not present

## 2020-11-26 DIAGNOSIS — K219 Gastro-esophageal reflux disease without esophagitis: Secondary | ICD-10-CM | POA: Diagnosis present

## 2020-11-26 DIAGNOSIS — J9611 Chronic respiratory failure with hypoxia: Secondary | ICD-10-CM | POA: Diagnosis present

## 2020-11-26 DIAGNOSIS — J310 Chronic rhinitis: Secondary | ICD-10-CM | POA: Diagnosis not present

## 2020-11-26 DIAGNOSIS — Z881 Allergy status to other antibiotic agents status: Secondary | ICD-10-CM | POA: Diagnosis not present

## 2020-11-26 DIAGNOSIS — R0609 Other forms of dyspnea: Secondary | ICD-10-CM | POA: Diagnosis not present

## 2020-11-26 DIAGNOSIS — Z6841 Body Mass Index (BMI) 40.0 and over, adult: Secondary | ICD-10-CM | POA: Diagnosis not present

## 2020-11-26 DIAGNOSIS — E1122 Type 2 diabetes mellitus with diabetic chronic kidney disease: Secondary | ICD-10-CM | POA: Diagnosis not present

## 2020-11-26 DIAGNOSIS — I509 Heart failure, unspecified: Secondary | ICD-10-CM | POA: Diagnosis not present

## 2020-11-26 DIAGNOSIS — I160 Hypertensive urgency: Secondary | ICD-10-CM | POA: Diagnosis not present

## 2020-11-26 DIAGNOSIS — I517 Cardiomegaly: Secondary | ICD-10-CM | POA: Diagnosis not present

## 2020-11-26 DIAGNOSIS — N186 End stage renal disease: Secondary | ICD-10-CM | POA: Diagnosis present

## 2020-11-26 DIAGNOSIS — J810 Acute pulmonary edema: Secondary | ICD-10-CM | POA: Diagnosis not present

## 2020-11-26 DIAGNOSIS — D649 Anemia, unspecified: Secondary | ICD-10-CM | POA: Diagnosis not present

## 2020-11-26 DIAGNOSIS — Z743 Need for continuous supervision: Secondary | ICD-10-CM | POA: Diagnosis not present

## 2020-11-26 DIAGNOSIS — G894 Chronic pain syndrome: Secondary | ICD-10-CM | POA: Diagnosis not present

## 2020-11-26 DIAGNOSIS — J9621 Acute and chronic respiratory failure with hypoxia: Secondary | ICD-10-CM | POA: Diagnosis present

## 2020-11-26 DIAGNOSIS — Z885 Allergy status to narcotic agent status: Secondary | ICD-10-CM | POA: Diagnosis not present

## 2020-11-26 DIAGNOSIS — Z888 Allergy status to other drugs, medicaments and biological substances status: Secondary | ICD-10-CM | POA: Diagnosis not present

## 2020-11-26 DIAGNOSIS — I161 Hypertensive emergency: Secondary | ICD-10-CM | POA: Diagnosis present

## 2020-11-26 DIAGNOSIS — E1169 Type 2 diabetes mellitus with other specified complication: Secondary | ICD-10-CM | POA: Diagnosis present

## 2020-11-26 DIAGNOSIS — E785 Hyperlipidemia, unspecified: Secondary | ICD-10-CM | POA: Diagnosis not present

## 2020-11-26 DIAGNOSIS — E1129 Type 2 diabetes mellitus with other diabetic kidney complication: Secondary | ICD-10-CM | POA: Diagnosis not present

## 2020-11-26 DIAGNOSIS — R6889 Other general symptoms and signs: Secondary | ICD-10-CM | POA: Diagnosis not present

## 2020-11-26 DIAGNOSIS — E8779 Other fluid overload: Secondary | ICD-10-CM | POA: Diagnosis not present

## 2020-11-26 LAB — COMPREHENSIVE METABOLIC PANEL
ALT: 20 U/L (ref 0–44)
AST: 16 U/L (ref 15–41)
Albumin: 4.3 g/dL (ref 3.5–5.0)
Alkaline Phosphatase: 64 U/L (ref 38–126)
Anion gap: 12 (ref 5–15)
BUN: 57 mg/dL — ABNORMAL HIGH (ref 6–20)
CO2: 24 mmol/L (ref 22–32)
Calcium: 7.9 mg/dL — ABNORMAL LOW (ref 8.9–10.3)
Chloride: 98 mmol/L (ref 98–111)
Creatinine, Ser: 11.52 mg/dL — ABNORMAL HIGH (ref 0.44–1.00)
GFR, Estimated: 4 mL/min — ABNORMAL LOW (ref >60)
Glucose, Bld: 202 mg/dL — ABNORMAL HIGH (ref 70–99)
Potassium: 4.7 mmol/L (ref 3.5–5.1)
Sodium: 134 mmol/L — ABNORMAL LOW (ref 135–145)
Total Bilirubin: 0.7 mg/dL (ref 0.3–1.2)
Total Protein: 8.9 g/dL — ABNORMAL HIGH (ref 6.5–8.1)

## 2020-11-26 LAB — GLUCOSE, CAPILLARY: Glucose-Capillary: 81 mg/dL (ref 70–99)

## 2020-11-26 LAB — CBC WITH DIFFERENTIAL/PLATELET
Abs Immature Granulocytes: 0.08 10*3/uL — ABNORMAL HIGH (ref 0.00–0.07)
Basophils Absolute: 0.1 10*3/uL (ref 0.0–0.1)
Basophils Relative: 0 %
Eosinophils Absolute: 0.4 10*3/uL (ref 0.0–0.5)
Eosinophils Relative: 3 %
HCT: 42.5 % (ref 36.0–46.0)
Hemoglobin: 13.1 g/dL (ref 12.0–15.0)
Immature Granulocytes: 1 %
Lymphocytes Relative: 11 %
Lymphs Abs: 1.5 10*3/uL (ref 0.7–4.0)
MCH: 31.3 pg (ref 26.0–34.0)
MCHC: 30.8 g/dL (ref 30.0–36.0)
MCV: 101.4 fL — ABNORMAL HIGH (ref 80.0–100.0)
Monocytes Absolute: 0.8 10*3/uL (ref 0.1–1.0)
Monocytes Relative: 6 %
Neutro Abs: 10.7 10*3/uL — ABNORMAL HIGH (ref 1.7–7.7)
Neutrophils Relative %: 79 %
Platelets: 222 10*3/uL (ref 150–400)
RBC: 4.19 MIL/uL (ref 3.87–5.11)
RDW: 13.9 % (ref 11.5–15.5)
WBC: 13.6 10*3/uL — ABNORMAL HIGH (ref 4.0–10.5)
nRBC: 0 % (ref 0.0–0.2)

## 2020-11-26 LAB — I-STAT CHEM 8, ED
BUN: 88 mg/dL — ABNORMAL HIGH (ref 6–20)
Calcium, Ion: 0.84 mmol/L — CL (ref 1.15–1.40)
Chloride: 104 mmol/L (ref 98–111)
Creatinine, Ser: 12.7 mg/dL — ABNORMAL HIGH (ref 0.44–1.00)
Glucose, Bld: 200 mg/dL — ABNORMAL HIGH (ref 70–99)
HCT: 44 % (ref 36.0–46.0)
Hemoglobin: 15 g/dL (ref 12.0–15.0)
Potassium: 7.4 mmol/L (ref 3.5–5.1)
Sodium: 135 mmol/L (ref 135–145)
TCO2: 27 mmol/L (ref 22–32)

## 2020-11-26 LAB — TROPONIN I (HIGH SENSITIVITY)
Troponin I (High Sensitivity): 30 ng/L — ABNORMAL HIGH (ref <18)
Troponin I (High Sensitivity): 50 ng/L — ABNORMAL HIGH (ref <18)

## 2020-11-26 LAB — RESP PANEL BY RT-PCR (FLU A&B, COVID) ARPGX2
Influenza A by PCR: NEGATIVE
Influenza B by PCR: NEGATIVE
SARS Coronavirus 2 by RT PCR: NEGATIVE

## 2020-11-26 LAB — BLOOD GAS, VENOUS
Acid-base deficit: 2.5 mmol/L — ABNORMAL HIGH (ref 0.0–2.0)
Bicarbonate: 21 mmol/L (ref 20.0–28.0)
FIO2: 100
O2 Saturation: 91.7 %
Patient temperature: 36.5
pCO2, Ven: 59.4 mmHg (ref 44.0–60.0)
pH, Ven: 7.229 — ABNORMAL LOW (ref 7.250–7.430)
pO2, Ven: 75.9 mmHg — ABNORMAL HIGH (ref 32.0–45.0)

## 2020-11-26 LAB — POTASSIUM: Potassium: 4.5 mmol/L (ref 3.5–5.1)

## 2020-11-26 LAB — HEPATITIS B SURFACE ANTIGEN: Hepatitis B Surface Ag: NONREACTIVE

## 2020-11-26 MED ORDER — HEPARIN SODIUM (PORCINE) 5000 UNIT/ML IJ SOLN
5000.0000 [IU] | Freq: Three times a day (TID) | INTRAMUSCULAR | Status: DC
  Administered 2020-11-26 – 2020-11-27 (×2): 5000 [IU] via SUBCUTANEOUS
  Filled 2020-11-26 (×5): qty 1

## 2020-11-26 MED ORDER — ONDANSETRON HCL 4 MG/2ML IJ SOLN
4.0000 mg | Freq: Four times a day (QID) | INTRAMUSCULAR | Status: DC | PRN
  Administered 2020-11-27: 4 mg via INTRAVENOUS
  Filled 2020-11-26 (×2): qty 2

## 2020-11-26 MED ORDER — HEPARIN SODIUM (PORCINE) 1000 UNIT/ML DIALYSIS: 1000.0000 [IU] | INTRAMUSCULAR | Status: DC | PRN

## 2020-11-26 MED ORDER — ASPIRIN EC 81 MG PO TBEC
81.0000 mg | DELAYED_RELEASE_TABLET | Freq: Every day | ORAL | Status: DC
  Administered 2020-11-27 – 2020-11-28 (×2): 81 mg via ORAL
  Filled 2020-11-26 (×2): qty 1

## 2020-11-26 MED ORDER — ALTEPLASE 2 MG IJ SOLR
2.0000 mg | Freq: Once | INTRAMUSCULAR | Status: AC | PRN
  Filled 2020-11-26: qty 2

## 2020-11-26 MED ORDER — SALINE SPRAY 0.65 % NA SOLN
1.0000 | NASAL | Status: DC | PRN
  Filled 2020-11-26: qty 44

## 2020-11-26 MED ORDER — ONDANSETRON HCL 4 MG PO TABS
4.0000 mg | ORAL_TABLET | Freq: Four times a day (QID) | ORAL | Status: DC | PRN
  Administered 2020-11-28: 4 mg via ORAL
  Filled 2020-11-26: qty 1

## 2020-11-26 MED ORDER — CINACALCET HCL 30 MG PO TABS
30.0000 mg | ORAL_TABLET | ORAL | Status: DC
  Administered 2020-11-27: 30 mg via ORAL
  Filled 2020-11-26 (×2): qty 1

## 2020-11-26 MED ORDER — INSULIN GLARGINE 100 UNIT/ML ~~LOC~~ SOLN
10.0000 [IU] | Freq: Every day | SUBCUTANEOUS | Status: DC
  Administered 2020-11-27: 10 [IU] via SUBCUTANEOUS
  Filled 2020-11-26 (×3): qty 0.1

## 2020-11-26 MED ORDER — LABETALOL HCL 5 MG/ML IV SOLN
20.0000 mg | Freq: Once | INTRAVENOUS | Status: AC
  Administered 2020-11-26: 20 mg via INTRAVENOUS
  Filled 2020-11-26: qty 4

## 2020-11-26 MED ORDER — TIZANIDINE HCL 2 MG PO TABS
4.0000 mg | ORAL_TABLET | Freq: Three times a day (TID) | ORAL | Status: DC
  Administered 2020-11-26 – 2020-11-28 (×5): 4 mg via ORAL
  Filled 2020-11-26 (×5): qty 2

## 2020-11-26 MED ORDER — CARVEDILOL 12.5 MG PO TABS
25.0000 mg | ORAL_TABLET | Freq: Two times a day (BID) | ORAL | Status: DC
  Administered 2020-11-26 – 2020-11-28 (×4): 25 mg via ORAL
  Filled 2020-11-26 (×4): qty 2

## 2020-11-26 MED ORDER — AMLODIPINE BESYLATE 5 MG PO TABS
5.0000 mg | ORAL_TABLET | Freq: Every day | ORAL | Status: DC
  Administered 2020-11-28: 5 mg via ORAL
  Filled 2020-11-26 (×2): qty 1

## 2020-11-26 MED ORDER — HEPARIN SODIUM (PORCINE) 1000 UNIT/ML DIALYSIS
20.0000 [IU]/kg | INTRAMUSCULAR | Status: DC | PRN
  Administered 2020-11-26: 3300 [IU] via INTRAVENOUS_CENTRAL

## 2020-11-26 MED ORDER — NITROGLYCERIN IN D5W 200-5 MCG/ML-% IV SOLN
0.0000 ug/min | INTRAVENOUS | Status: DC
  Administered 2020-11-26: 5 ug/min via INTRAVENOUS
  Filled 2020-11-26: qty 250

## 2020-11-26 MED ORDER — ACETAMINOPHEN 325 MG PO TABS: 650.0000 mg | ORAL_TABLET | Freq: Four times a day (QID) | ORAL | Status: DC | PRN

## 2020-11-26 MED ORDER — NITROGLYCERIN IN D5W 200-5 MCG/ML-% IV SOLN: 5.0000 ug/min | INTRAVENOUS | Status: DC

## 2020-11-26 MED ORDER — DOCUSATE SODIUM 100 MG PO CAPS
100.0000 mg | ORAL_CAPSULE | Freq: Every day | ORAL | Status: DC
  Filled 2020-11-26: qty 1

## 2020-11-26 MED ORDER — GABAPENTIN 100 MG PO CAPS
100.0000 mg | ORAL_CAPSULE | ORAL | Status: DC
  Administered 2020-11-27: 100 mg via ORAL
  Filled 2020-11-26 (×2): qty 1

## 2020-11-26 MED ORDER — ALBUTEROL SULFATE (2.5 MG/3ML) 0.083% IN NEBU: 10.0000 mg | INHALATION_SOLUTION | Freq: Once | RESPIRATORY_TRACT | Status: DC

## 2020-11-26 MED ORDER — LORATADINE 10 MG PO TABS
10.0000 mg | ORAL_TABLET | Freq: Every day | ORAL | Status: DC
  Administered 2020-11-27 – 2020-11-28 (×2): 10 mg via ORAL
  Filled 2020-11-26 (×2): qty 1

## 2020-11-26 MED ORDER — INSULIN ASPART 100 UNIT/ML IJ SOLN
0.0000 [IU] | Freq: Three times a day (TID) | INTRAMUSCULAR | Status: DC
  Administered 2020-11-27 (×2): 2 [IU] via SUBCUTANEOUS
  Administered 2020-11-28: 1 [IU] via SUBCUTANEOUS

## 2020-11-26 MED ORDER — CHLORHEXIDINE GLUCONATE CLOTH 2 % EX PADS
6.0000 | MEDICATED_PAD | Freq: Every day | CUTANEOUS | Status: DC
  Administered 2020-11-27 – 2020-11-28 (×2): 6 via TOPICAL

## 2020-11-26 MED ORDER — DOXERCALCIFEROL 4 MCG/2ML IV SOLN: 5.0000 ug | INTRAVENOUS | Status: DC

## 2020-11-26 MED ORDER — DEXTROSE 50 % IV SOLN: 1.0000 | Freq: Once | INTRAVENOUS | Status: DC

## 2020-11-26 MED ORDER — ACETAMINOPHEN 650 MG RE SUPP: 650.0000 mg | Freq: Four times a day (QID) | RECTAL | Status: DC | PRN

## 2020-11-26 MED ORDER — INSULIN ASPART 100 UNIT/ML IV SOLN: 5.0000 [IU] | Freq: Once | INTRAVENOUS | Status: DC

## 2020-11-26 MED ORDER — GABAPENTIN 100 MG PO CAPS
100.0000 mg | ORAL_CAPSULE | ORAL | Status: DC
  Administered 2020-11-26 – 2020-11-28 (×2): 100 mg via ORAL
  Filled 2020-11-26 (×2): qty 1

## 2020-11-26 MED ORDER — CALCIUM GLUCONATE-NACL 1-0.675 GM/50ML-% IV SOLN: 1.0000 g | Freq: Once | INTRAVENOUS | Status: DC

## 2020-11-26 MED ORDER — SODIUM CHLORIDE 0.9 % IV SOLN: 100.0000 mL | INTRAVENOUS | Status: DC | PRN

## 2020-11-26 MED ORDER — ATORVASTATIN CALCIUM 10 MG PO TABS
10.0000 mg | ORAL_TABLET | Freq: Every day | ORAL | Status: DC
  Administered 2020-11-27 – 2020-11-28 (×2): 10 mg via ORAL
  Filled 2020-11-26 (×2): qty 1

## 2020-11-26 MED ORDER — INSULIN ASPART 100 UNIT/ML IJ SOLN: 0.0000 [IU] | Freq: Every day | INTRAMUSCULAR | Status: DC

## 2020-11-26 MED ORDER — DM-GUAIFENESIN ER 30-600 MG PO TB12
1.0000 | ORAL_TABLET | Freq: Two times a day (BID) | ORAL | Status: DC
  Administered 2020-11-26 – 2020-11-27 (×3): 1 via ORAL
  Filled 2020-11-26 (×3): qty 1

## 2020-11-26 MED ORDER — NITROGLYCERIN 0.4 MG SL SUBL
0.4000 mg | SUBLINGUAL_TABLET | Freq: Once | SUBLINGUAL | Status: AC
  Administered 2020-11-26: 0.4 mg via SUBLINGUAL
  Filled 2020-11-26: qty 1

## 2020-11-26 MED ORDER — PANTOPRAZOLE SODIUM 40 MG PO TBEC
40.0000 mg | DELAYED_RELEASE_TABLET | Freq: Every day | ORAL | Status: DC
  Administered 2020-11-26 – 2020-11-28 (×3): 40 mg via ORAL
  Filled 2020-11-26 (×3): qty 1

## 2020-11-26 MED ORDER — NITROGLYCERIN IN D5W 200-5 MCG/ML-% IV SOLN
INTRAVENOUS | Status: AC
  Administered 2020-11-26: 50 ug/min via INTRAVENOUS
  Filled 2020-11-26: qty 250

## 2020-11-26 MED ORDER — HEPARIN SODIUM (PORCINE) 1000 UNIT/ML DIALYSIS
4200.0000 [IU] | INTRAMUSCULAR | Status: DC | PRN
  Administered 2020-11-26 – 2020-11-27 (×2): 4200 [IU] via INTRAVENOUS_CENTRAL
  Filled 2020-11-26 (×3): qty 5

## 2020-11-26 MED ORDER — GABAPENTIN 100 MG PO CAPS: 100.0000 mg | ORAL_CAPSULE | ORAL | Status: DC

## 2020-11-26 NOTE — Progress Notes (Signed)
Was informed that this patient was in ER with SOB/pulm edema/malignant HTN-  req bipap and nitro drip  Last HD OP orders in the chart were  [name] TTS 4 hours TDC 1 K bath 400 BFR-  EDW 167.5 Heparin yes Hect 5 Epo 7000 and sensipar 30 mg per treatment   Have contacted Levada Dy, the HD nurse to come in and do a semi emergent treatment on this patient today.  Full renal consult to follow   Courtney Gray

## 2020-11-26 NOTE — ED Triage Notes (Signed)
Pt arrived by [facility]. Called out from home for SOB. In route she became more lethargic and SOB. Pt arrived on CPAP. Pt went from 100% to 70% in route. Pt now in ED on bipap.

## 2020-11-26 NOTE — ED Provider Notes (Addendum)
[facility] EMERGENCY DEPARTMENT Provider Note   CSN: [id number] Arrival date & time: 11/26/20  1053     History Chief Complaint  Patient presents with   Shortness of Breath    Courtney Gray is a 50 y.o. female with PMH DM, ESRD on dialysis Tuesday Thursday Saturday who presents to the emergency department in active respiratory distress.  Per EMS, the patient was not requiring external oxygen when she initially was seen in her home, but while in the ambulance, had an acute worsening of her shortness of breath with associated hypertension and hypoxia.  The patient arrives on CPAP saturating 98% with blood pressures greater than 200.  She appears diaphoretic and in respiratory distress with accessory muscle movement.   Shortness of Breath Associated symptoms: cough   Associated symptoms: no abdominal pain, no chest pain, no ear pain, no fever, no rash, no sore throat and no vomiting       Past Medical History:  Diagnosis Date   Diabetes mellitus without complication ([place])    High cholesterol    Hypertension    Renal disorder     Patient Active Problem List   Diagnosis Date Noted   Bacteremia    Fever 09/01/2020   HTN (hypertension) 09/01/2020   ESRD (end stage renal disease) ([place]) 09/01/2020   Chronic respiratory failure with hypoxia ([place]) 09/01/2020   Respiratory distress    Hypertensive emergency    Respiratory failure ([place]) 08/28/2020   Acute respiratory failure with hypoxia ([place]) 11/28/2019   Fluid overload 11/28/2019   Hypertensive urgency 11/28/2019   Type 2 diabetes mellitus with other specified complication ([place]) [id number]   Anemia of chronic disease 11/28/2019   Morbid obesity ([place]) 11/28/2019    Past Surgical History:  Procedure Laterality Date   AV FISTULA PLACEMENT     x2, unsuccessful.    IR FLUORO GUIDE CV LINE RIGHT  08/28/2020   IR PERC TUN PERIT CATH WO PORT S&I /IMAG  09/06/2020   IR US GUIDE VASC ACCESS LEFT  09/06/2020     OB History      Gravida  2   Para  2   Term  2   Preterm      AB      Living  2      SAB      IAB      Ectopic      Multiple      Live Births              Family History  Problem Relation Age of Onset   Diabetes Mother    Diabetes Father     Social History   Tobacco Use   Smoking status: Never   Smokeless tobacco: Never  Vaping Use   Vaping Use: Never used  Substance Use Topics   Alcohol use: Never   Drug use: Never    Home Medications Prior to Admission medications   Medication Sig Start Date End Date Taking? Authorizing Provider  acetaminophen (TYLENOL) 500 MG tablet Take 1,000 mg by mouth every 6 (six) hours as needed for moderate pain or headache.    [provider]  amLODipine (NORVASC) 10 MG tablet Take 10 mg by mouth See admin instructions. Every other day (on dialysis days) if blood pressure is in the 200's. 03/27/18   [provider]  aspirin EC 81 MG tablet Take 81 mg by mouth daily.    [provider]  atorvastatin (LIPITOR) 10 MG tablet Take 10  mg by mouth daily. 03/29/19   [provider]  carvedilol (COREG) 25 MG tablet Take 25 mg by mouth 2 (two) times daily. 04/20/19   [provider]  cinacalcet (SENSIPAR) 30 MG tablet Take 1 tablet (30 mg total) by mouth Every Tuesday,Thursday,and Saturday with dialysis. 09/01/20 11/30/20  Enzo Bi, MD  diphenhydrAMINE (BENADRYL) 25 MG tablet Take 50 mg by mouth daily as needed for allergies.    [provider]  docusate sodium (COLACE) 100 MG capsule Take 100 mg by mouth daily.    [provider]  doxercalciferol (HECTOROL) 4 MCG/2ML injection Inject 2.5 mLs (5 mcg total) into the vein Every Tuesday,Thursday,and Saturday with dialysis. 09/01/20   Enzo Bi, MD  famotidine (PEPCID) 20 MG tablet Take 20 mg by mouth daily as needed for heartburn or indigestion. Patient not taking: Reported on 09/01/2020    [provider]  gabapentin (NEURONTIN) 100 MG  capsule Take 100-200 mg by mouth See admin instructions. Take 100 mg twice daily on non dialysis days.  Take 100 mg at 9am before dialysis, '100mg'$  after dialysis at 5pm, and '100mg'$  at 1am on Tuesdays, Thursdays,and Saturdays. 04/20/19   [provider]  LANTUS SOLOSTAR 100 UNIT/ML Solostar Pen Inject 12 Units into the skin at bedtime.  03/18/19   [provider]  lisinopril (ZESTRIL) 10 MG tablet Take 1 tablet (10 mg total) by mouth daily. 09/08/20   Orson Eva, MD  nitroGLYCERIN (NITROSTAT) 0.4 MG SL tablet Place 0.4 mg under the tongue every 5 (five) minutes as needed for chest pain.    [provider]  Simethicone (GAS-X PO) Take 1 tablet by mouth daily as needed (gas).    [provider]  sodium chloride (OCEAN) 0.65 % SOLN nasal spray Place 1 spray into both nostrils in the morning and at bedtime.    [provider]  tiZANidine (ZANAFLEX) 4 MG tablet Take 4 mg by mouth every 8 (eight) hours.  04/20/19   [provider]    Allergies    Benzonatate, Hydralazine, Amoxicillin, Clonidine, Morphine, and Oxycodone  Review of Systems   Review of Systems  Constitutional:  Negative for chills and fever.  HENT:  Negative for ear pain and sore throat.   Eyes:  Negative for pain and visual disturbance.  Respiratory:  Positive for cough and shortness of breath.   Cardiovascular:  Negative for chest pain and palpitations.  Gastrointestinal:  Negative for abdominal pain and vomiting.  Genitourinary:  Negative for dysuria and hematuria.  Musculoskeletal:  Negative for arthralgias and back pain.  Skin:  Negative for color change and rash.  Neurological:  Negative for seizures and syncope.  All other systems reviewed and are negative.  Physical Exam Updated Vital Signs BP (!) 176/106   Pulse 60   Temp 97.7 F (36.5 C) (Axillary)   Resp 15   Ht '5\' 5"'$  (1.651 m)   Wt (!) 163 kg   SpO2 100%   BMI 59.80 kg/m   Physical Exam Vitals and nursing note  reviewed.  Constitutional:      General: She is in acute distress.     Appearance: She is well-developed. She is ill-appearing and diaphoretic.  HENT:     Head: Normocephalic and atraumatic.  Eyes:     Conjunctiva/sclera: Conjunctivae normal.  Cardiovascular:     Rate and Rhythm: Regular rhythm. Tachycardia present.     Heart sounds: No murmur heard. Pulmonary:     Effort: Tachypnea, accessory muscle usage and  respiratory distress present.     Breath sounds: Rales present.  Abdominal:     Palpations: Abdomen is soft.     Tenderness: There is no abdominal tenderness.  Musculoskeletal:     Cervical back: Neck supple.  Skin:    General: Skin is warm.  Neurological:     Mental Status: She is alert.    ED Results / Procedures / Treatments   Labs (all labs ordered are listed, but only abnormal results are displayed) Labs Reviewed  CBC WITH DIFFERENTIAL/PLATELET - Abnormal; Notable for the following components:      Result Value   WBC 13.6 (*)    MCV 101.4 (*)    Neutro Abs 10.7 (*)    Abs Immature Granulocytes 0.08 (*)    All other components within normal limits  COMPREHENSIVE METABOLIC PANEL - Abnormal; Notable for the following components:   Sodium 134 (*)    Glucose, Bld 202 (*)    BUN 57 (*)    Creatinine, Ser 11.52 (*)    Calcium 7.9 (*)    Total Protein 8.9 (*)    GFR, Estimated 4 (*)    All other components within normal limits  BLOOD GAS, VENOUS - Abnormal; Notable for the following components:   pH, Ven 7.229 (*)    pO2, Ven 75.9 (*)    Acid-base deficit 2.5 (*)    All other components within normal limits  I-STAT CHEM 8, ED - Abnormal; Notable for the following components:   Potassium 7.4 (*)    BUN 88 (*)    Creatinine, Ser 12.70 (*)    Glucose, Bld 200 (*)    Calcium, Ion 0.84 (*)    All other components within normal limits  TROPONIN I (HIGH SENSITIVITY) - Abnormal; Notable for the following components:   Troponin I (High Sensitivity) 30 (*)    All  other components within normal limits  TROPONIN I (HIGH SENSITIVITY) - Abnormal; Notable for the following components:   Troponin I (High Sensitivity) 50 (*)    All other components within normal limits  RESP PANEL BY RT-PCR (FLU A&B, COVID) ARPGX2  POTASSIUM  POTASSIUM  POTASSIUM  POTASSIUM  HEPATITIS B SURFACE ANTIGEN  POTASSIUM    EKG EKG Interpretation  Date/Time:  Monday November 26 2020 11:07:41 EDT Ventricular Rate:  86 PR Interval:  173 QRS Duration: 103 QT Interval:  400 QTC Calculation: 479 R Axis:   80 Text Interpretation: Sinus rhythm Probable left atrial enlargement Confirmed by Leannah Guse ([id number]) on 11/26/2020 4:04:50 PM  Radiology DG Chest Portable 1 View  Result Date: 11/26/2020 CLINICAL DATA:  Severe shortness of breath, lethargy, arrived on CPAP, history hypertension, diabetes mellitus, renal disease on dialysis EXAM: PORTABLE CHEST 1 VIEW COMPARISON:  Portable exam 1108 hours compared to 10/21/2020 FINDINGS: LEFT jugular central venous catheter with tip projecting over cavoatrial junction. Enlargement of cardiac silhouette with pulmonary vascular congestion. Scattered infiltrates consistent with pulmonary edema. Lateral RIGHT lung base excluded. No gross pleural effusion or pneumothorax. IMPRESSION: Enlargement of cardiac silhouette with pulmonary vascular congestion and probable mild pulmonary edema. Electronically Signed   By: Lavonia Dana M.D.   On: 11/26/2020 11:34    Procedures Procedures   Medications Ordered in ED Medications  Chlorhexidine Gluconate Cloth 2 % PADS 6 each (has no administration in time range)  0.9 %  sodium chloride infusion (has no administration in time range)  0.9 %  sodium chloride infusion (has no administration in time range)  alteplase (CATHFLO  ACTIVASE) injection 2 mg (has no administration in time range)  heparin injection 3,300 Units (has no administration in time range)  heparin injection 5,000 Units (5,000 Units  Subcutaneous Given 11/26/20 1326)  heparin injection 4,200 Units (has no administration in time range)  nitroGLYCERIN (NITROSTAT) SL tablet 0.4 mg (0.4 mg Sublingual Given 11/26/20 1110)  labetalol (NORMODYNE) injection 20 mg (20 mg Intravenous Given 11/26/20 1319)    ED Course  I have reviewed the triage vital signs and the nursing notes.  Pertinent labs & imaging results that were available during my care of the patient were reviewed by me and considered in my medical decision making (see chart for details).    MDM Rules/Calculators/A&P                           Patient seen in the emergency department for evaluation of respiratory distress.  Physical exam reveals a toxic appearing patient in respiratory distress with diaphoresis and accessory muscle movement.  Patient immediately placed on BiPAP and in the setting of probable fluid overload with rales on exam and significant hypertension, patient clinically appears to be in flash pulmonary edema.  This was confirmed on chest x-ray.  Patient given 0.4 mg sublingual nitro and started on a nitro drip at 50/min and titrated up to 100.  Initial i-STAT with a potassium of 7.4, but I suspect this was likely hemolyzed as CMP with a normal potassium of 4.7, creatinine 11.52, BUN 57.  Patient respiratory effort improved with BiPAP and nitroglycerin, and patient was admitted to the ICU.  CRITICAL CARE Performed by: Teressa Lower   Total critical care time: 30 minutes  Critical care time was exclusive of separately billable procedures and treating other patients.  Critical care was necessary to treat or prevent imminent or life-threatening deterioration.  Critical care was time spent personally by me on the following activities: development of treatment plan with patient and/or surrogate as well as nursing, discussions with consultants, evaluation of patient's response to treatment, examination of patient, obtaining history from patient or  surrogate, ordering and performing treatments and interventions, ordering and review of laboratory studies, ordering and review of radiographic studies, pulse oximetry and re-evaluation of patient's condition.  Final Clinical Impression(s) / ED Diagnoses Final diagnoses:  None    Rx / DC Orders ED Discharge Orders     None        Shane Badeaux, MD 11/26/20 1607    Teressa Lower, MD 11/26/20 [phone]

## 2020-11-26 NOTE — Procedures (Signed)
    HEMODIALYSIS TREATMENT NOTE:  Emergent HD performed in ED12.  Pt verbalized fear of cramping with aggressive UF but agreed to attempt 5L goal over 4 hours.  Catheter entry site is unremarkable.  Dressing was changed per policy.  NTG infusion was discontinued by primary RN at onset of HD per Dr. Anson Fret order to maximize UF.  HD was initiated at Qb 300 per pt's request "or the machine will alarm."  Lines were reversed twice.  Frequent AP alarms with movement, coughing, deep inspiration.  Average Qb 279m/min.  Will attempt activase prior to next session.  Historically, pt tolerates a maximum removal of 2 liters per session.  After 3L had been removed pt reported abdominal cramping radiating to her back unrelieved with NS bolus and interruption in UF.  She asked to end the session after 3 hours and [facility].  All blood was returned.  Net UF 2.9 liters.  She was able to transition from BiPAP to 4L O2 via [place] post-dialysis.   ARockwell Alexandria RN

## 2020-11-26 NOTE — ED Notes (Signed)
Verbal order by MD to titrate drip to 121mg/hr on nitro.

## 2020-11-26 NOTE — ED Notes (Addendum)
Date and time results received: 11/26/20 3:16 PM  Test: troponin Critical Value: 69  Name of Provider Notified: Dr Johnna Acosta  Orders Received?  see orders

## 2020-11-26 NOTE — ED Triage Notes (Signed)
MD at bedside for MSE

## 2020-11-26 NOTE — ED Notes (Signed)
Verbal order to titrate drip to 23mg/hr on nitro.

## 2020-11-26 NOTE — H&P (Signed)
History and Physical    Courtney Gray [id number] DOB: Oct 22, 1970 DOA: 11/26/2020  PCP: [facility], [facility]   Patient coming from: Home  I have personally briefly reviewed patient's old medical records in [place]  Chief Complaint: Shortness of breath, hypoxia and orthopnea  HPI: Courtney Gray is a 50 y.o. 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  Patient reports symptoms appears sudden in the last 24-48 hours and progressing.  She expressed experiencing intermittent frothy coughing spells and inability to lay down flat.  Patient reported no significant fluid removal during her scheduled dialysis on Saturday, 11/24/2020 and expressed increase fluid intake as she has been battling some sinusitis with increased dry mouth.  Patient reports no fever, no chills, no melena, no hematochezia, no chest pain, no palpitations, no focal weakness, no sick contacts.  Patient is now vaccinated against COVID  ED Course: Significantly hypoxic and requiring BiPAP to maintain O2 sats on presentation to the emergency department.  Patient also noted to have hypertensive urgency with flat elevation in troponins.  Chest x-ray with positive vascular congestion and interstitial edema.  Nephrology service was consulted for emergent dialysis.  TRH has been called to place patient in the hospital for further evaluation and management.  Review of Systems: As per HPI otherwise all other systems reviewed and are negative.  Past Medical History:  Diagnosis Date   Diabetes mellitus without complication (HCC)    High cholesterol    Hypertension     Renal disorder     Past Surgical History:  Procedure Laterality Date   AV FISTULA PLACEMENT     x2, unsuccessful.    IR FLUORO GUIDE CV LINE RIGHT  08/28/2020   IR PERC TUN PERIT CATH WO PORT S&I /IMAG  09/06/2020   IR US GUIDE VASC ACCESS LEFT  09/06/2020    Social History  reports that she has never smoked. She has never used smokeless tobacco. She reports that she does not drink alcohol and does not use drugs.  Allergies  Allergen Reactions   Benzonatate Other (See Comments)    Hallucinations   Hydralazine Other (See Comments)    Hallucinations   Amoxicillin Nausea And Vomiting   Clonidine Other (See Comments)    Altered mental state   Morphine Itching   Oxycodone Itching    Family History  Problem Relation Age of Onset   Diabetes Mother    Diabetes Father     Prior to Admission medications   Medication Sig Start Date End Date Taking? Authorizing Provider  acetaminophen (TYLENOL) 500 MG tablet Take 1,000 mg by mouth every 6 (six) hours as needed for moderate pain or headache.    [provider]  amLODipine (NORVASC) 10 MG tablet Take 10 mg by mouth See admin instructions. Every other day (on dialysis days) if blood pressure is in the 200's. 03/27/18   [provider]  aspirin EC 81 MG tablet Take 81 mg by mouth daily.    [provider]  atorvastatin (LIPITOR) 10 MG tablet Take 10 mg by mouth daily. 03/29/19   [provider]  carvedilol (COREG) 25 MG tablet Take 25 mg by mouth 2 (two) times daily. 04/20/19   [provider]  cinacalcet (SENSIPAR) 30 MG tablet Take 1 tablet (30 mg total) by mouth Every Tuesday,Thursday,and Saturday with dialysis. 09/01/20 11/30/20  Enzo Bi, MD  diphenhydrAMINE (BENADRYL) 25 MG tablet Take 50 mg by mouth daily as needed for allergies.    [provider]  docusate sodium (COLACE) 100 MG capsule Take 100 mg by mouth daily.    [provider]  doxercalciferol (HECTOROL) 4 MCG/2ML  injection Inject 2.5 mLs (5 mcg total) into the vein Every Tuesday,Thursday,and Saturday with dialysis. 09/01/20   Enzo Bi, MD  famotidine (PEPCID) 20 MG tablet Take 20 mg by mouth daily as needed for heartburn or indigestion. Patient not taking: Reported on 09/01/2020    [provider]  gabapentin (NEURONTIN) 100 MG capsule Take 100-200 mg by mouth See admin instructions. Take 100 mg twice daily on non dialysis days.  Take 100 mg at 9am before dialysis, '100mg'$  after dialysis at 5pm, and '100mg'$  at 1am on Tuesdays, Thursdays,and Saturdays. 04/20/19   [provider]  LANTUS SOLOSTAR 100 UNIT/ML Solostar Pen Inject 12 Units into the skin at bedtime.  03/18/19   [provider]  lisinopril (ZESTRIL) 10 MG tablet Take 1 tablet (10 mg total) by mouth daily. 09/08/20   Orson Eva, MD  nitroGLYCERIN (NITROSTAT) 0.4 MG SL tablet Place 0.4 mg under the tongue every 5 (five) minutes as needed for chest pain.    [provider]  Simethicone (GAS-X PO) Take 1 tablet by mouth daily as needed (gas).    [provider]  sodium chloride (OCEAN) 0.65 % SOLN nasal spray Place 1 spray into both nostrils in the morning and at bedtime.    [provider]  tiZANidine (ZANAFLEX) 4 MG tablet Take 4 mg by mouth every 8 (eight) hours.  04/20/19   [provider]    Physical Exam: Vitals:   11/26/20 1700 11/26/20 1730 11/26/20 1745 11/26/20 1800  BP: (!) 171/94 (!) 192/94 (!) 198/96 (!) 178/80  Pulse: 64 63 61 65  Resp: '16 16 16 14  '$ Temp:      TempSrc:      SpO2:  100% 100% 100%  Weight:      Height:        Constitutional: NAD, calm, comfortable; denying chest pain.  Patient has been seen after dialysis and after the use of BiPAP.  She was in significant respiratory distress on presentation. Vitals:   11/26/20 1700 11/26/20 1730 11/26/20 1745 11/26/20 1800  BP: (!) 171/94 (!) 192/94 (!) 198/96 (!) 178/80  Pulse: 64 63 61 65  Resp: '16 16 16 14  '$ Temp:       TempSrc:      SpO2:  100% 100% 100%  Weight:      Height:       Eyes: PERRL, lids and conjunctivae normal, no icterus, no nystagmus. ENMT: Mucous membranes are moist. Posterior pharynx clear of any exudate or lesions. Poor dentition with some missing pieces. Neck: normal, supple, no masses, no thyromegaly, JVD unable to assess due to body habitus Respiratory: Positive crackles bilaterally, no using accessory muscles.  4 L nasal cannula supplementation in place at time of my evaluation (patient was using BiPAP earlier). Cardiovascular: Regular rate and rhythm, no rubs, no gallops, positive 2-3+ edema/lymphedema Abdomen: Obese, no tenderness, no masses palpated. No hepatosplenomegaly. Bowel sounds positive.  Musculoskeletal: no clubbing / cyanosis. No joint deformity upper and lower extremities. Good ROM, no contractures. Normal muscle tone.  Skin: no petechiae. Neurologic: CN 2-12 grossly intact.  Sensation intact, DTR normal. Strength 5/5 in all 4.  Psychiatric: Normal judgment and insight. Alert and oriented x 3. Normal mood.   Labs on Admission: I have personally reviewed following labs and imaging studies  CBC: Recent Labs  Lab 11/26/20 1111 11/26/20 1122  WBC 13.6*  --   NEUTROABS 10.7*  --   HGB 13.1 15.0  HCT 42.5 44.0  MCV 101.4*  --   PLT 222  --     Basic Metabolic Panel: Recent Labs  Lab 11/26/20 1111 11/26/20 1122 11/26/20 1138  NA 134* 135  --   K 4.7 7.4* 4.5  CL 98 104  --   CO2 24  --   --   GLUCOSE 202* 200*  --   BUN 57* 88*  --   CREATININE 11.52* 12.70*  --   CALCIUM 7.9*  --   --     GFR: Estimated Creatinine Clearance: 8.3 mL/min (A) (by C-G formula based on SCr of 12.7 mg/dL (H)).  Liver Function Tests: Recent Labs  Lab 11/26/20 1111  AST 16  ALT 20  ALKPHOS 64  BILITOT 0.7  PROT 8.9*  ALBUMIN 4.3   Radiological Exams on Admission: DG Chest Portable 1 View  Result Date: 11/26/2020 CLINICAL DATA:  Severe shortness of breath,  lethargy, arrived on CPAP, history hypertension, diabetes mellitus, renal disease on dialysis EXAM: PORTABLE CHEST 1 VIEW COMPARISON:  Portable exam 1108 hours compared to 10/21/2020 FINDINGS: LEFT jugular central venous catheter with tip projecting over cavoatrial junction. Enlargement of cardiac silhouette with pulmonary vascular congestion. Scattered infiltrates consistent with pulmonary edema. Lateral RIGHT lung base excluded. No gross pleural effusion or pneumothorax. IMPRESSION: Enlargement of cardiac silhouette with pulmonary vascular congestion and probable mild pulmonary edema. Electronically Signed   By: Lavonia Dana M.D.   On: 11/26/2020 11:34    EKG: Independently reviewed. No acute ischemic changes. Sinus rhythm   Assessment/Plan 1-acute on chronic respiratory failure with hypoxia (HCC) -Patient with underlying history of chronic respiratory failure using 3 L nasal cannula supplementation; presented in the need of BiPAP secondary to acute pulmonary edema. -Emergent dialysis provided by nephrology service -Low-sodium diet has been encouraged -Patient received nitroglycerin drip and BiPAP which alleviated her respiratory distress. -Repeat chest x-ray in a.m. -Most likely will require back-to-back HD treatments in order to facilitate resolution of pulmonary edema and vascular congestion. -Wean oxygen down to baseline as tolerated.  2-Hypertensive urgency -In the setting of not receiving her medications along with pulmonary edema. -Improve after dialysis and IV labetalol -Patient also received nitroglycerin. -Will resume home antihypertensive agents and follow vital signs closely.  3-Type 2 diabetes mellitus with other specified complication ([place]) -Started on sliding scale insulin and will continue the use of Lantus nightly -Modified carbohydrate diet has been ordered.  4-Morbid obesity (HCC) -Body mass index is 59.8 kg/m. -Low calorie diet, portion control and increase physical  activity discussed with patient.  5-ESRD (end stage renal disease) [facility]) -Nephrology consulted and will follow recommendations for dialysis treatments.  6-secondary hyperparathyroidism -Continue Hectorol and Sensipar  7-gastroesophageal reflux disease -Continue PPI  8-chronic pain -Continue the use of Neurontin and as needed tizanidine.  9-allergy rhinitis -Continue Claritin, Mucinex and as needed nasal spray.  10-hyperlipidemia -Continue statins.  11-elevated troponin -In the setting of demand ischemia from hypertensive urgency and pulmonary edema in a patient with underlying end-stage renal disease -Will check 2D echo -Continue beta-blocker and statins -Patient denies chest pain.  Sinus rhythm, no acute ischemic changes.  DVT prophylaxis: Heparin Code Status:  Full code Family Communication:  No family at bedside. Disposition Plan:   Patient is from:  Home  Anticipated DC to:  Home  Anticipated DC date:  To be determined  Anticipated DC barriers: Stabilization of patient's respiratory status  Consults called:  Nephrology service. Admission status:  Inpatient, length of stay more than 2 midnights; stepdown bed.  Severity of Illness: The appropriate patient status for this patient is INPATIENT. Inpatient status is judged to be reasonable and necessary in order to provide the required intensity of service to ensure the patient's safety. The patient's presenting symptoms, physical exam findings, and initial radiographic and laboratory data in the context of their chronic comorbidities is felt to place them at high risk for further clinical deterioration. Furthermore, it is not anticipated that the patient will be medically stable for discharge from the hospital within 2 midnights of admission. The following factors support the patient status of inpatient.   " The patient's presenting symptoms include SOB and hypoxia. " The worrisome physical exam findings include requiring  BiPAP to maintain O2 sat above 90%; positive crackles on auscultation, tachypnea and difficulty speaking in full sentences.. " The initial radiographic and laboratory data are worrisome because of chest x-ray demonstrating vascular congestion and pulmonary edema.. " The chronic co-morbidities include end-stage renal disease, chronic respiratory failure with hypoxia (normally uses 3 L nasal cannula supplementation), morbid obesity..   * I certify that at the point of admission it is my clinical judgment that the patient will require inpatient hospital care spanning beyond 2 midnights from the point of admission due to high intensity of service, high risk for further deterioration and high frequency of surveillance required.Barton Dubois MD [facility]  How to contact the [facility] Attending or Consulting provider [place] or covering provider during after hours [place], for this patient?   Check the care team in [facility] and look for a) attending/consulting [facility] provider listed and b) the [facility] team listed Log into [email or web address] and use [facility]'s universal password to access. If you do not have the password, please contact the hospital operator. Locate the [facility] provider you are looking for under [facility] and page to a number that you can be directly reached. If you still have difficulty reaching the provider, please page the [facility] (Director on Call) for the Hospitalists listed on amion for assistance.  11/26/2020, 6:31 PM

## 2020-11-26 NOTE — Progress Notes (Signed)
**Note De-Identified  Obfuscation** Patient removed from BIPAP and placed on 4 L [place], tolerating well, VS WNL.  RRT to continue to monitor.

## 2020-11-26 NOTE — Progress Notes (Signed)
Attempted to set patient up on BIPAP pt just received meal to eat will attempt again later

## 2020-11-27 ENCOUNTER — Inpatient Hospital Stay (HOSPITAL_COMMUNITY): Payer: Medicare Other

## 2020-11-27 DIAGNOSIS — R0609 Other forms of dyspnea: Secondary | ICD-10-CM

## 2020-11-27 LAB — GLUCOSE, CAPILLARY
Glucose-Capillary: 171 mg/dL — ABNORMAL HIGH (ref 70–99)
Glucose-Capillary: 177 mg/dL — ABNORMAL HIGH (ref 70–99)
Glucose-Capillary: 141 mg/dL — ABNORMAL HIGH (ref 70–99)
Glucose-Capillary: 127 mg/dL — ABNORMAL HIGH (ref 70–99)

## 2020-11-27 LAB — ECHOCARDIOGRAM COMPLETE
AR max vel: 2.19 cm2
AV Area VTI: 2.03 cm2
AV Area mean vel: 2.03 cm2
AV Mean grad: 8 mmHg
AV Peak grad: 16.6 mmHg
Ao pk vel: 2.04 m/s
Area-P 1/2: 1.87 cm2
Height: 65 in
S' Lateral: 3.48 cm
Weight: 5777.82 oz

## 2020-11-27 LAB — BASIC METABOLIC PANEL
Anion gap: 16 — ABNORMAL HIGH (ref 5–15)
BUN: 48 mg/dL — ABNORMAL HIGH (ref 6–20)
CO2: 21 mmol/L — ABNORMAL LOW (ref 22–32)
Calcium: 7.3 mg/dL — ABNORMAL LOW (ref 8.9–10.3)
Chloride: 97 mmol/L — ABNORMAL LOW (ref 98–111)
Creatinine, Ser: 10.4 mg/dL — ABNORMAL HIGH (ref 0.44–1.00)
GFR, Estimated: 4 mL/min — ABNORMAL LOW (ref >60)
Glucose, Bld: 138 mg/dL — ABNORMAL HIGH (ref 70–99)
Potassium: 4.1 mmol/L (ref 3.5–5.1)
Sodium: 134 mmol/L — ABNORMAL LOW (ref 135–145)

## 2020-11-27 LAB — CBC
HCT: 33.3 % — ABNORMAL LOW (ref 36.0–46.0)
Hemoglobin: 10.3 g/dL — ABNORMAL LOW (ref 12.0–15.0)
MCH: 31.5 pg (ref 26.0–34.0)
MCHC: 30.9 g/dL (ref 30.0–36.0)
MCV: 101.8 fL — ABNORMAL HIGH (ref 80.0–100.0)
Platelets: 163 10*3/uL (ref 150–400)
RBC: 3.27 MIL/uL — ABNORMAL LOW (ref 3.87–5.11)
RDW: 13.6 % (ref 11.5–15.5)
WBC: 10.4 10*3/uL (ref 4.0–10.5)
nRBC: 0 % (ref 0.0–0.2)

## 2020-11-27 LAB — PHOSPHORUS: Phosphorus: 6 mg/dL — ABNORMAL HIGH (ref 2.5–4.6)

## 2020-11-27 LAB — MAGNESIUM: Magnesium: 2.2 mg/dL (ref 1.7–2.4)

## 2020-11-27 LAB — MRSA NEXT GEN BY PCR, NASAL: MRSA by PCR Next Gen: NOT DETECTED

## 2020-11-27 MED ORDER — DARBEPOETIN ALFA 60 MCG/0.3ML IJ SOSY
PREFILLED_SYRINGE | INTRAMUSCULAR | Status: AC
  Administered 2020-11-27: 60 ug via INTRAVENOUS
  Filled 2020-11-27: qty 0.3

## 2020-11-27 MED ORDER — CALCITRIOL 0.25 MCG PO CAPS
1.0000 ug | ORAL_CAPSULE | ORAL | Status: DC
  Administered 2020-11-27: 1 ug via ORAL
  Filled 2020-11-27: qty 4

## 2020-11-27 MED ORDER — HEPARIN SODIUM (PORCINE) 1000 UNIT/ML IJ SOLN
INTRAMUSCULAR | Status: AC
  Administered 2020-11-27: 3300 [IU] via INTRAVENOUS_CENTRAL
  Filled 2020-11-27: qty 8

## 2020-11-27 MED ORDER — LABETALOL HCL 5 MG/ML IV SOLN
20.0000 mg | Freq: Once | INTRAVENOUS | Status: AC
  Administered 2020-11-27: 20 mg via INTRAVENOUS
  Filled 2020-11-27: qty 4

## 2020-11-27 MED ORDER — LOPERAMIDE HCL 2 MG PO CAPS
2.0000 mg | ORAL_CAPSULE | ORAL | Status: DC | PRN
  Administered 2020-11-27: 2 mg via ORAL
  Filled 2020-11-27 (×2): qty 1

## 2020-11-27 MED ORDER — ALTEPLASE 2 MG IJ SOLR
INTRAMUSCULAR | Status: AC
  Administered 2020-11-27: 4 mg
  Filled 2020-11-27: qty 4

## 2020-11-27 MED ORDER — CHLORHEXIDINE GLUCONATE CLOTH 2 % EX PADS
6.0000 | MEDICATED_PAD | Freq: Every day | CUTANEOUS | Status: DC
  Administered 2020-11-27 – 2020-11-28 (×2): 6 via TOPICAL

## 2020-11-27 MED ORDER — DARBEPOETIN ALFA 60 MCG/0.3ML IJ SOSY
60.0000 ug | PREFILLED_SYRINGE | INTRAMUSCULAR | Status: DC
  Filled 2020-11-27: qty 0.3

## 2020-11-27 NOTE — Progress Notes (Signed)
  Echocardiogram 2D Echocardiogram has been performed.  Courtney Gray 11/27/2020, 10:04 AM

## 2020-11-27 NOTE — Progress Notes (Signed)
PROGRESS NOTE    Courtney Gray  [id number] DOB: [birth year] DOA: 11/26/2020 PCP: Medicine, [facility]   Chief Complaint  Patient presents with   Shortness of Breath    Brief admission narrative:  Courtney Gray is a 50 y.o. 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  Patient reports symptoms appears sudden in the last 24-48 hours and progressing.  She expressed experiencing intermittent frothy coughing spells and inability to lay down flat.  Patient reported no significant fluid removal during her scheduled dialysis on Saturday, 11/24/2020 and expressed increase fluid intake as she has been battling some sinusitis with increased dry mouth.  Patient reports no fever, no chills, no melena, no hematochezia, no chest pain, no palpitations, no focal weakness, no sick contacts.   Patient is now vaccinated against COVID   ED Course: Significantly hypoxic and requiring BiPAP to maintain O2 sats on presentation to the emergency department.  Patient also noted to have hypertensive urgency with flat elevation in troponins.  Chest x-ray with positive vascular congestion and interstitial edema.  Nephrology service was consulted for emergent dialysis.  TR[facility] has been called to place patient in the hospital for further evaluation and management.  Assessment & Plan: 1-acute on chronic respiratory failure with hypoxia -In the setting of pulmonary edema and vascular congestion. -Improved after emergent dialysis provided on 820-second 22 -Still with some decreased breath sounds at her bases bilaterally; no frank crackles or wheezing. -Patient  remains afebrile. -Repeat x-ray demonstrating improvement in vascular congestion but is still having decreased lung volumes with persistent fluid changes in her lungs. -Saturation is back to baseline on chronic supplementation (3-4 L nasal cannula supplementation). -Plan is for dialysis again today in order to get patient back on schedule and facilitate extra fluid removal and volume status optimization. -Patient instructed to follow low-sodium diet and to be compliant with restricted fluid intake. -Continue to follow daily weights and strict I's and O's.  2-hypertensive urgency -Patient off nitro drip -Overall improvement in her blood pressure appreciated; is still running high prior to dialysis -Nephrology service asking to hold patient's Norvasc in morning to have more room for fluid removal during dialysis. -Continue the use of Coreg and as needed labetalol.  3-elevated troponin -In the setting of demand ischemia -Patient reports no chest pain. -2D echo reassuring, preserved ejection fraction and no wall motion abnormalities.  There is moderate left ventricle hypertrophy but diastolic parameters are indeterminate. -Continue blood pressure management  4-morbid obesity -Body mass index is 60.09 kg/m. -Low calorie diet and portion control discussed with patient.  5-type 2 diabetes with nephropathy -Continue sliding scale insulin and Lantus -Follow CBGs and adjust management as needed Modified carbohydrate diet has been ordered.  6-ESRD/anemia of chronic kidney disease -No overt bleeding appreciated -Epogen therapy as per nephrology discretion -Continue to follow nephrology recommendations for dialysis management  7-secondary hyperparathyroidism -Continue Hectorol and Sensipar  8-gastroesophageal reflux disease -Continue PPI.  9-chronic neuropathic pain -Continue the use of Neurontin and as needed tizanidine.  10-hyperlipidemia -Continue statins.  11-allergy  rhinitis -Continue Claritin, Mucinex and as needed saline spray.   DVT prophylaxis: Heparin Code Status: Full code. Family Communication: No family at bedside. Disposition:   Status is: Inpatient  Remains inpatient  appropriate because:IV treatments appropriate due to intensity of illness or inability to take PO  Dispo: The patient is from: Home              Anticipated d/c is to: Home              Patient currently is not medically stable to d/c. Probably ready for discharge in am. Patient needs assistance with transportation back home.   Difficult to place patient No   Consultants:  Nephrology service  Procedures:  See below for x-ray reports. 2D echo: 1. Left ventricular ejection fraction, by estimation, is 55 to 60%. The  left ventricle has normal function. The left ventricle has no regional  wall motion abnormalities. There is moderate left ventricular hypertrophy.  Left ventricular diastolic  parameters are indeterminate. Elevated left atrial pressure.   2. Right ventricular systolic function is normal. The right ventricular  size is normal. Tricuspid regurgitation signal is inadequate for assessing  PA pressure.   3. The mitral valve is abnormal. Mild mitral valve regurgitation. No  evidence of mitral stenosis.   4. The aortic valve is tricuspid. There is mild calcification of the  aortic valve. There is mild thickening of the aortic valve. Aortic valve  regurgitation is not visualized. No aortic stenosis is present.   Antimicrobials:  None   Subjective: Morbidly obese, reporting no chest pain or palpitations.  Expressing improvement in her breathing and currently down to 3-4 L nasal cannula supplementation.  Patient reports some abdominal pain and also diarrhea.  Objective: Vitals:   11/27/20 0720 11/27/20 0800 11/27/20 0900 11/27/20 1100  BP:  (!) 158/69 (!) 167/52   Pulse: (!) 49 (!) 56 (!) 51 (!) 58  Resp: '20 16 17 19  '$ Temp: 98.5 F (36.9 C)   98.5 F  (36.9 C)  TempSrc: Oral   Oral  SpO2: 100% 100% 100% 100%  Weight:      Height:        Intake/Output Summary (Last 24 hours) at 11/27/2020 1512 Last data filed at 11/26/2020 1730 Gross per 24 hour  Intake 2.39 ml  Output 2967 ml  Net -2964.61 ml   Filed Weights   11/26/20 1415 11/26/20 1851 11/27/20 0400  Weight: (!) 163 kg (!) 163.8 kg (!) 163.8 kg    Examination:  General exam: Appears more comfortable today; complaining of intermittent abdominal pain and having some diarrhea.  No nausea, no vomiting, back to 3-4 L nasal cannula supplementation at this moment.  No fever Respiratory system: Improved air movement bilaterally, no using accessory muscles; decreased breath sounds at the bases. Cardiovascular system: S1 & S2 heard, RRR.  No rubs or gallops; unable to assess JVD Due to body habitus Gastrointestinal system: Abdomen is obese nondistended, soft and mildly tender to palpation in her right lower and right flank area.  No guarding.  Positive bowel sounds.   Central nervous system: Alert and oriented. No focal neurological deficits. Extremities: No cyanosis or clubbing. Skin: No petechiae. Psychiatry: Judgement and insight appear normal. Mood & affect appropriate.     Data Reviewed: I have personally reviewed following labs and imaging studies  CBC: Recent Labs  Lab 11/26/20 1111 11/26/20 1122 11/27/20 0442  WBC 13.6*  --  10.4  NEUTROABS 10.7*  --   --   HGB 13.1 15.0 10.3*  HCT 42.5 44.0 33.3*  MCV 101.4*  --  101.8*  PLT 222  --  [id number]    Basic Metabolic Panel: Recent Labs  Lab 11/26/20 1111 11/26/20 1122 11/26/20 1138 11/27/20 0442  NA 134* 135  --  134*  K 4.7 7.4* 4.5 4.1  CL 98 104  --  97*  CO2 24  --   --  21*  GLUCOSE 202* 200*  --  138*  BUN 57* 88*  --  48*  CREATININE 11.52* 12.70*  --  10.40*  CALCIUM 7.9*  --   --  7.3*  MG  --   --   --  2.2  PHOS  --   --   --  6.0*    GFR: Estimated Creatinine Clearance: 10.2 mL/min (A) (by C-G  formula based on SCr of 10.4 mg/dL (H)).  Liver Function Tests: Recent Labs  Lab 11/26/20 1111  AST 16  ALT 20  ALKPHOS 64  BILITOT 0.7  PROT 8.9*  ALBUMIN 4.3    CBG: Recent Labs  Lab 11/26/20 2122 11/27/20 0719 11/27/20 1102  GLUCAP 81 171* 177*    Recent Results (from the past 240 hour(s))  Resp Panel by RT-PCR (Flu A&B, Covid) Nasopharyngeal Swab     Status: None   Collection Time: 11/26/20  1:02 PM   Specimen: Nasopharyngeal Swab; Nasopharyngeal(NP) swabs in vial transport medium  Result Value Ref Range Status   SARS Coronavirus 2 by RT PCR NEGATIVE NEGATIVE Final    Comment: (NOTE) SARS-CoV-2 target nucleic acids are NOT DETECTED.  The SARS-CoV-2 RNA is generally detectable in upper respiratory specimens during the acute phase of infection. The lowest concentration of SARS-CoV-2 viral copies this assay can detect is 138 copies/mL. A negative result does not preclude SARS-Cov-2 infection and should not be used as the sole basis for treatment or other patient management decisions. A negative result may occur with  improper specimen collection/handling, submission of specimen other than nasopharyngeal swab, presence of viral mutation(s) within the areas targeted by this assay, and inadequate number of viral copies(<138 copies/mL). A negative result must be combined with clinical observations, patient history, and epidemiological information. The expected result is Negative.  Fact Sheet for Patients:  EntrepreneurPulse.com.au  Fact Sheet for Healthcare Providers:  IncredibleEmployment.be  This test is no t yet approved or cleared by the [place] FDA and  has been authorized for detection and/or diagnosis of SARS-CoV-2 by FDA under an Emergency Use Authorization (EUA). This EUA will remain  in effect (meaning this test can be used) for the duration of the COVID-19 declaration under Section 564(b)(1) of the Act,  21 U.S.C.section 360bbb-3(b)(1), unless the authorization is terminated  or revoked sooner.       Influenza A by PCR NEGATIVE NEGATIVE Final   Influenza B by PCR NEGATIVE NEGATIVE Final    Comment: (NOTE) The Xpert Xpress SARS-CoV-2/FLU/RSV plus assay is intended as an aid in the diagnosis of influenza from Nasopharyngeal swab specimens and should not be used as a sole basis for treatment. Nasal washings and aspirates are unacceptable for Xpert Xpress SARS-CoV-2/FLU/RSV testing.  Fact Sheet for Patients: EntrepreneurPulse.com.au  Fact Sheet for Healthcare Providers: IncredibleEmployment.be  This test is not yet approved or cleared by the [place] FDA and has been authorized for detection and/or diagnosis of SARS-CoV-2 by FDA under an Emergency Use Authorization (EUA). This EUA will remain in effect (meaning this test can be used) for the duration of the COVID-19 declaration under Section 564(b)(1) of the Act, 21 U.S.C. section 360bbb-3(b)(1), unless the authorization is terminated or revoked.  Performed at [facility], [address], [place], [place] [address]  MRSA Next Gen by PCR, Nasal     Status: None   Collection Time: 11/26/20  6:51 PM   Specimen: Nasal Mucosa; Nasal Swab  Result Value Ref Range Status   MRSA by PCR Next Gen NOT DETECTED NOT DETECTED Final    Comment: (NOTE) The GeneXpert MRSA Assay (FDA approved for NASAL specimens only), is one component of a comprehensive MRSA colonization surveillance program. It is not intended to diagnose MRSA infection nor to guide or monitor treatment for MRSA infections. Test performance is not FDA approved in patients less than 56 years old. Performed at [facility], [address], [place],  [address]      Radiology Studies: DG Chest 2 View  Result Date: 11/27/2020 CLINICAL DATA:  50 year old female with history of pulmonary vascular congestion. EXAM: CHEST - 2 VIEW  COMPARISON:  Chest x-ray 11/26/2020. FINDINGS: Left-sided internal jugular PermCath with tip terminating at the superior cavoatrial junction. Lung volumes are low. No consolidative airspace disease. Possible trace bilateral pleural effusions. No pneumothorax. There is cephalization of the pulmonary vasculature and slight indistinctness of the interstitial markings suggestive of mild pulmonary edema. Mild cardiomegaly. The patient is rotated to the left on today's exam, resulting in distortion of the mediastinal contours and reduced diagnostic sensitivity and specificity for mediastinal pathology. Atherosclerotic calcifications in the thoracic aorta. IMPRESSION: 1. Low lung volumes with persistent (but improving) evidence of congestive heart failure, as above. 2. Aortic atherosclerosis. Electronically Signed   By: Vinnie Langton M.D.   On: 11/27/2020 06:06   DG Chest Portable 1 View  Result Date: 11/26/2020 CLINICAL DATA:  Severe shortness of breath, lethargy, arrived on CPAP, history hypertension, diabetes mellitus, renal disease on dialysis EXAM: PORTABLE CHEST 1 VIEW COMPARISON:  Portable exam 1108 hours compared to 10/21/2020 FINDINGS: LEFT jugular central venous catheter with tip projecting over cavoatrial junction. Enlargement of cardiac silhouette with pulmonary vascular congestion. Scattered infiltrates consistent with pulmonary edema. Lateral RIGHT lung base excluded. No gross pleural effusion or pneumothorax. IMPRESSION: Enlargement of cardiac silhouette with pulmonary vascular congestion and probable mild pulmonary edema. Electronically Signed   By: Lavonia Dana M.D.   On: 11/26/2020 11:34   ECHOCARDIOGRAM COMPLETE  Result Date: 11/27/2020    ECHOCARDIOGRAM REPORT   Patient Name:   JASMANE LARANCE Date of Exam: 11/27/2020 Medical Rec #:  [id number]       Height:       65.0 in Accession #:    [id number]      Weight:       361.1 lb Date of Birth:  [birth year]        BSA:          2.543 m Patient Age:     77 years        BP:           167/52 mmHg Patient Gender: F               HR:           51 bpm. Exam Location:  [facility] Procedure: 2D Echo, Cardiac Doppler and Color Doppler Indications:    Dyspnea  History:        Patient has no prior history of Echocardiogram examinations.                 Signs/Symptoms:Shortness of Breath, Dyspnea and ESRD, resp.                 failure; Risk Factors:morbid obesity, Hypertension, Diabetes and  Dyslipidemia.  Sonographer:    Dustin Flock RDCS Referring Phys: [id number] Camdynn Maranto  Sonographer Comments: Technically difficult study due to poor echo windows and patient is morbidly obese. IMPRESSIONS  1. Left ventricular ejection fraction, by estimation, is 55 to 60%. The left ventricle has normal function. The left ventricle has no regional wall motion abnormalities. There is moderate left ventricular hypertrophy. Left ventricular diastolic parameters are indeterminate. Elevated left atrial pressure.  2. Right ventricular systolic function is normal. The right ventricular size is normal. Tricuspid regurgitation signal is inadequate for assessing PA pressure.  3. The mitral valve is abnormal. Mild mitral valve regurgitation. No evidence of mitral stenosis.  4. The aortic valve is tricuspid. There is mild calcification of the aortic valve. There is mild thickening of the aortic valve. Aortic valve regurgitation is not visualized. No aortic stenosis is present. FINDINGS  Left Ventricle: Left ventricular ejection fraction, by estimation, is 55 to 60%. The left ventricle has normal function. The left ventricle has no regional wall motion abnormalities. The left ventricular internal cavity size was normal in size. There is  moderate left ventricular hypertrophy. Left ventricular diastolic parameters are indeterminate. Elevated left atrial pressure. Right Ventricle: The right ventricular size is normal. No increase in right ventricular wall thickness. Right ventricular  systolic function is normal. Tricuspid regurgitation signal is inadequate for assessing PA pressure. Left Atrium: Left atrial size was normal in size. Right Atrium: Right atrial size was normal in size. Pericardium: There is no evidence of pericardial effusion. Mitral Valve: The mitral valve is abnormal. There is mild thickening of the mitral valve leaflet(s). There is mild calcification of the mitral valve leaflet(s). Mild mitral annular calcification. Mild mitral valve regurgitation. No evidence of mitral valve stenosis. Tricuspid Valve: The tricuspid valve is normal in structure. Tricuspid valve regurgitation is mild . No evidence of tricuspid stenosis. Aortic Valve: The aortic valve is tricuspid. There is mild calcification of the aortic valve. There is mild thickening of the aortic valve. There is mild aortic valve annular calcification. Aortic valve regurgitation is not visualized. No aortic stenosis  is present. Aortic valve mean gradient measures 8.0 mmHg. Aortic valve peak gradient measures 16.6 mmHg. Aortic valve area, by VTI measures 2.03 cm. Pulmonic Valve: The pulmonic valve was not well visualized. Pulmonic valve regurgitation is not visualized. No evidence of pulmonic stenosis. Aorta: The aortic root is normal in size and structure. IAS/Shunts: The interatrial septum was not well visualized.  LEFT VENTRICLE PLAX 2D LVIDd:         4.88 cm  Diastology LVIDs:         3.48 cm  LV e' medial:    4.47 cm/s LV PW:         1.56 cm  LV E/e' medial:  23.5 LV IVS:        1.58 cm  LV e' lateral:   6.31 cm/s LVOT diam:     2.40 cm  LV E/e' lateral: 16.6 LV SV:         100 LV SV Index:   39 LVOT Area:     4.52 cm  RIGHT VENTRICLE RV Basal diam:  3.72 cm RV S prime:     15.00 cm/s TAPSE (M-mode): 2.3 cm LEFT ATRIUM             Index       RIGHT ATRIUM           Index LA diam:        4.60  cm 1.81 cm/m  RA Area:     23.40 cm LA Vol (A2C):   83.8 ml 32.96 ml/m RA Volume:   75.50 ml  29.69 ml/m LA Vol (A4C):   75.9  ml 29.85 ml/m LA Biplane Vol: 85.5 ml 33.62 ml/m  AORTIC VALVE AV Area (Vmax):    2.19 cm AV Area (Vmean):   2.03 cm AV Area (VTI):     2.03 cm AV Vmax:           204.00 cm/s AV Vmean:          134.000 cm/s AV VTI:            0.492 m AV Peak Grad:      16.6 mmHg AV Mean Grad:      8.0 mmHg LVOT Vmax:         98.80 cm/s LVOT Vmean:        60.000 cm/s LVOT VTI:          0.221 m LVOT/AV VTI ratio: 0.45  AORTA Ao Root diam: 2.70 cm MITRAL VALVE MV Area (PHT): 1.87 cm     SHUNTS MV Decel Time: 405 msec     Systemic VTI:  0.22 m MV E velocity: 105.00 cm/s  Systemic Diam: 2.40 cm MV A velocity: 78.30 cm/s MV E/A ratio:  1.34 Carlyle Dolly MD Electronically signed by Carlyle Dolly MD Signature Date/Time: 11/27/2020/11:50:36 AM    Final      Scheduled Meds:  amLODipine  5 mg Oral Daily   aspirin EC  81 mg Oral Daily   atorvastatin  10 mg Oral Daily   calcitRIOL  1 mcg Oral Q T,Th,Sa-HD   carvedilol  25 mg Oral BID   Chlorhexidine Gluconate Cloth  6 each Topical Q0600   Chlorhexidine Gluconate Cloth  6 each Topical Q0600   cinacalcet  30 mg Oral Q T,Th,Sa-HD   darbepoetin (ARANESP) injection - DIALYSIS  60 mcg Intravenous Q Tue-HD   dextromethorphan-guaiFENesin  1 tablet Oral BID   docusate sodium  100 mg Oral Daily   gabapentin  100 mg Oral 2 times per day on Sun Mon Wed Fri   gabapentin  100 mg Oral 3 times per day on Tue Thu Sat   heparin injection (subcutaneous)  5,000 Units Subcutaneous Q8H   heparin sodium (porcine)       insulin aspart  0-5 Units Subcutaneous QHS   insulin aspart  0-9 Units Subcutaneous TID WC   insulin glargine  10 Units Subcutaneous QHS   labetalol  20 mg Intravenous Once   loratadine  10 mg Oral Daily   pantoprazole  40 mg Oral Daily   tiZANidine  4 mg Oral Q8H   Continuous Infusions:  sodium chloride     sodium chloride     nitroGLYCERIN Stopped (11/26/20 2300)     LOS: 1 day    Time spent: 35 minutes   Barton Dubois, MD [facility]   To  contact the attending provider between 7A-7P or the covering provider during after hours 7P-7A, please log into the web site [email or web address] and access using universal [place] password for that web site. If you do not have the password, please call the hospital operator.  11/27/2020, 3:12 PM

## 2020-11-27 NOTE — Procedures (Signed)
   HEMODIALYSIS TREATMENT NOTE:  Activase instilled pre-HD x 2 hours.  Pt wanted to wait until after a meal and pain pill before she was dialyzed.  In the interim, had multiple requests (O2 concentrator, CPAP or sleep study to assess for need) which have been discussed with attending and TOC team.  Was scheduled for abdominal CT prior to HD but she has declined the oral contrast.  She was concerned the stitches had come loose on her HD catheter and requested the dressing be loosened so she could inspect the site herself.  Dressing was reinforced as she didn't want it changed.  She later asked if dressing could be partially removed to expose the sutures.    Asked for UF to be turned off after 2.9 liters removed due to cramping.  We were attempting a 3.5 liter goal, although she pointed out earlier that I had "failed to discuss that" with her.  She next asked to end treatment 20 minutes early, again, for cramping in her hands.  She signed the [facility] waiver.  Post-dialysis weight on standing scale was 163.0 kg.  "My dry weight exactly."  No changes from pre-HD assessment.   Total run time: 3 hours 40 minutes Net UF 2.6 liters   [name], RN

## 2020-11-27 NOTE — Consult Note (Signed)
Reason for Consult: To manage dialysis and dialysis related needs Referring Physician: Dyann Kief  Courtney Gray is an 50 y.o. female with morbid obesity, DM, HTN, chronic respiratory failure with baseline O2 at 3 L [place], chronic pain as well as ESRD-  TTS [name]-  according to HD unit she dictates her UF goal and BFR at the kidney center-  has not been getting to her EDW as OP due to large gains-  was scheduled for an extra treatment this week.  Unfortunately presented 8/22 with SOB/orthopnea/PND-  CXR c/w pulm edema-  req bipap and NTG drip-  req emergent HD-  only able to remove 2900 and she signed off early. She feels better -  speaking in full sentences-  back down to 4 liters- says she uses that sometimes at home.     Dialyzes at [facility]-  TTS - 4 hours  EDW 163- but not getting to-  left out on Sat at 166.6. HD Bath 1K/2.5 calc, Dialyzer rev300, Heparin load 2600- then 600 per hour. Access [facility] (refuses other ) -  400 BFR but refuses that high.  Epo 7600, calcitriol 1 mcg, sensipar 30 q treatment   Past Medical History:  Diagnosis Date   Diabetes mellitus without complication (HCC)    High cholesterol    Hypertension    Renal disorder     Past Surgical History:  Procedure Laterality Date   AV FISTULA PLACEMENT     x2, unsuccessful.    IR FLUORO GUIDE CV LINE RIGHT  08/28/2020   IR PERC TUN PERIT CATH WO PORT S&I /IMAG  09/06/2020   IR US GUIDE VASC ACCESS LEFT  09/06/2020    Family History  Problem Relation Age of Onset   Diabetes Mother    Diabetes Father     Social History:  reports that she has never smoked. She has never used smokeless tobacco. She reports that she does not drink alcohol and does not use drugs.  Allergies:  Allergies  Allergen Reactions   Benzonatate Other (See Comments)    Hallucinations   Hydralazine Other (See Comments)    Hallucinations   Amoxicillin Nausea And Vomiting   Clonidine Other (See Comments)    Altered mental state   Morphine  Itching   Oxycodone Itching    Medications: I have reviewed the patient's current medications.  Results for orders placed or performed during the hospital encounter of 11/26/20 (from the past 48 hour(s))  CBC with Differential     Status: Abnormal   Collection Time: 11/26/20 11:11 AM  Result Value Ref Range   WBC 13.6 (H) 4.0 - 10.5 K/uL   RBC 4.19 3.87 - 5.11 MIL/uL   Hemoglobin 13.1 12.0 - 15.0 g/dL   HCT 42.5 36.0 - 46.0 %   MCV 101.4 (H) 80.0 - 100.0 fL   MCH 31.3 26.0 - 34.0 pg   MCHC 30.8 30.0 - 36.0 g/dL   RDW 13.9 11.5 - 15.5 %   Platelets 222 150 - 400 K/uL   nRBC 0.0 0.0 - 0.2 %   Neutrophils Relative % 79 %   Neutro Abs 10.7 (H) 1.7 - 7.7 K/uL   Lymphocytes Relative 11 %   Lymphs Abs 1.5 0.7 - 4.0 K/uL   Monocytes Relative 6 %   Monocytes Absolute 0.8 0.1 - 1.0 K/uL   Eosinophils Relative 3 %   Eosinophils Absolute 0.4 0.0 - 0.5 K/uL   Basophils Relative 0 %   Basophils Absolute 0.1 0.0 -  0.1 K/uL   Immature Granulocytes 1 %   Abs Immature Granulocytes 0.08 (H) 0.00 - 0.07 K/uL    Comment: Performed at [facility], [address] [place], [place] [address]  Comprehensive metabolic panel     Status: Abnormal   Collection Time: 11/26/20 11:11 AM  Result Value Ref Range   Sodium 134 (L) 135 - 145 mmol/L   Potassium 4.7 3.5 - 5.1 mmol/L   Chloride 98 98 - 111 mmol/L   CO2 24 22 - 32 mmol/L   Glucose, Bld 202 (H) 70 - 99 mg/dL    Comment: Glucose reference range applies only to samples taken after fasting for at least 8 hours.   BUN 57 (H) 6 - 20 mg/dL   Creatinine, Ser 11.52 (H) 0.44 - 1.00 mg/dL   Calcium 7.9 (L) 8.9 - 10.3 mg/dL   Total Protein 8.9 (H) 6.5 - 8.1 g/dL   Albumin 4.3 3.5 - 5.0 g/dL   AST 16 15 - 41 U/L   ALT 20 0 - 44 U/L   Alkaline Phosphatase 64 38 - 126 U/L   Total Bilirubin 0.7 0.3 - 1.2 mg/dL   GFR, Estimated 4 (L) >60 mL/min    Comment: (NOTE) Calculated using the CKD-EPI Creatinine Equation (2021)    Anion gap 12 5 - 15     Comment: Performed at [facility], [address], [place], [place] [address]  Troponin I (High Sensitivity)     Status: Abnormal   Collection Time: 11/26/20 11:11 AM  Result Value Ref Range   Troponin I (High Sensitivity) 30 (H) <18 ng/L    Comment: (NOTE) Elevated high sensitivity troponin I (hsTnI) values and significant  changes across serial measurements may suggest ACS but many other  chronic and acute conditions are known to elevate hsTnI results.  Refer to the "Links" section for chest pain algorithms and additional  guidance. Performed at [facility], [address] [place], [place] [address]   I-stat chem 8, ED (not at [facility] or [facility])     Status: Abnormal   Collection Time: 11/26/20 11:22 AM  Result Value Ref Range   Sodium 135 135 - 145 mmol/L   Potassium 7.4 (HH) 3.5 - 5.1 mmol/L   Chloride 104 98 - 111 mmol/L   BUN 88 (H) 6 - 20 mg/dL   Creatinine, Ser 12.70 (H) 0.44 - 1.00 mg/dL   Glucose, Bld 200 (H) 70 - 99 mg/dL    Comment: Glucose reference range applies only to samples taken after fasting for at least 8 hours.   Calcium, Ion 0.84 (LL) 1.15 - 1.40 mmol/L   TCO2 27 22 - 32 mmol/L   Hemoglobin 15.0 12.0 - 15.0 g/dL   HCT 44.0 36.0 - 46.0 %  Blood gas, venous (at [facility] and [facility], not at [facility])     Status: Abnormal   Collection Time: 11/26/20 11:37 AM  Result Value Ref Range   FIO2 100.00    pH, Ven 7.229 (L) 7.250 - 7.430   pCO2, Ven 59.4 44.0 - 60.0 mmHg   pO2, Ven 75.9 (H) 32.0 - 45.0 mmHg   Bicarbonate 21.0 20.0 - 28.0 mmol/L   Acid-base deficit 2.5 (H) 0.0 - 2.0 mmol/L   O2 Saturation 91.7 %   Patient temperature 36.5     Comment: Performed at [facility], [address], [place], [place] [address]  Potassium     Status: None   Collection Time: 11/26/20 11:38 AM  Result Value Ref Range  Potassium 4.5 3.5 - 5.1 mmol/L    Comment: DELTA CHECK NOTED Performed at [facility], [address] [place], [place] [address]   Hepatitis B surface antigen     Status: None    Collection Time: 11/26/20 11:38 AM  Result Value Ref Range   Hepatitis B Surface Ag NON REACTIVE NON REACTIVE    Comment: Performed at [place] [address] [place], [place] [address]  Troponin I (High Sensitivity)     Status: Abnormal   Collection Time: 11/26/20 12:25 PM  Result Value Ref Range   Troponin I (High Sensitivity) 50 (H) <18 ng/L    Comment: RESULT CALLED TO, READ BACK BY AND VERIFIED WITH: OAKLEY,B [id number] ON 8.22.22 BY RUCINSKI,B DELTA CHECK NOTED (NOTE) Elevated high sensitivity troponin I (hsTnI) values and significant  changes across serial measurements may suggest ACS but many other  chronic and acute conditions are known to elevate hsTnI results.  Refer to the Links section for chest pain algorithms and additional  guidance. Performed at [facility], [address], [place], [place] [address]   Resp Panel by RT-PCR (Flu A&B, Covid) Nasopharyngeal Swab     Status: None   Collection Time: 11/26/20  1:02 PM   Specimen: Nasopharyngeal Swab; Nasopharyngeal(NP) swabs in vial transport medium  Result Value Ref Range   SARS Coronavirus 2 by RT PCR NEGATIVE NEGATIVE    Comment: (NOTE) SARS-CoV-2 target nucleic acids are NOT DETECTED.  The SARS-CoV-2 RNA is generally detectable in upper respiratory specimens during the acute phase of infection. The lowest concentration of SARS-CoV-2 viral copies this assay can detect is 138 copies/mL. A negative result does not preclude SARS-Cov-2 infection and should not be used as the sole basis for treatment or other patient management decisions. A negative result may occur with  improper specimen collection/handling, submission of specimen other than nasopharyngeal swab, presence of viral mutation(s) within the areas targeted by this assay, and inadequate number of viral copies(<138 copies/mL). A negative result must be combined with clinical observations, patient history, and epidemiological information. The  expected result is Negative.  Fact Sheet for Patients:  EntrepreneurPulse.com.au  Fact Sheet for Healthcare Providers:  IncredibleEmployment.be  This test is no t yet approved or cleared by the [place] FDA and  has been authorized for detection and/or diagnosis of SARS-CoV-2 by FDA under an Emergency Use Authorization (EUA). This EUA will remain  in effect (meaning this test can be used) for the duration of the COVID-19 declaration under Section 564(b)(1) of the Act, 21 U.S.C.section 360bbb-3(b)(1), unless the authorization is terminated  or revoked sooner.       Influenza A by PCR NEGATIVE NEGATIVE   Influenza B by PCR NEGATIVE NEGATIVE    Comment: (NOTE) The Xpert Xpress SARS-CoV-2/FLU/RSV plus assay is intended as an aid in the diagnosis of influenza from Nasopharyngeal swab specimens and should not be used as a sole basis for treatment. Nasal washings and aspirates are unacceptable for Xpert Xpress SARS-CoV-2/FLU/RSV testing.  Fact Sheet for Patients: EntrepreneurPulse.com.au  Fact Sheet for Healthcare Providers: IncredibleEmployment.be  This test is not yet approved or cleared by the [place] FDA and has been authorized for detection and/or diagnosis of SARS-CoV-2 by FDA under an Emergency Use Authorization (EUA). This EUA will remain in effect (meaning this test can be used) for the duration of the COVID-19 declaration under Section 564(b)(1) of the Act, 21 U.S.C. section 360bbb-3(b)(1), unless the authorization is terminated or revoked.  Performed at [facility], [address], [place], [place] [address]   Glucose, capillary     Status: None   Collection Time: 11/26/20  9:22 PM  Result Value Ref Range   Glucose-Capillary 81 70 - 99 mg/dL    Comment: Glucose reference range applies only to samples taken after fasting for at least 8 hours.   Comment 1 Notify RN    Comment 2 Document  in Chart   Basic metabolic panel     Status: Abnormal   Collection Time: 11/27/20  4:42 AM  Result Value Ref Range   Sodium 134 (L) 135 - 145 mmol/L   Potassium 4.1 3.5 - 5.1 mmol/L   Chloride 97 (L) 98 - 111 mmol/L   CO2 21 (L) 22 - 32 mmol/L   Glucose, Bld 138 (H) 70 - 99 mg/dL    Comment: Glucose reference range applies only to samples taken after fasting for at least 8 hours.   BUN 48 (H) 6 - 20 mg/dL   Creatinine, Ser 10.40 (H) 0.44 - 1.00 mg/dL   Calcium 7.3 (L) 8.9 - 10.3 mg/dL   GFR, Estimated 4 (L) >60 mL/min    Comment: (NOTE) Calculated using the CKD-EPI Creatinine Equation (2021)    Anion gap 16 (H) 5 - 15    Comment: Performed at [facility], [address] [place], [place] [address]  CBC     Status: Abnormal   Collection Time: 11/27/20  4:42 AM  Result Value Ref Range   WBC 10.4 4.0 - 10.5 K/uL   RBC 3.27 (L) 3.87 - 5.11 MIL/uL   Hemoglobin 10.3 (L) 12.0 - 15.0 g/dL   HCT 33.3 (L) 36.0 - 46.0 %   MCV 101.8 (H) 80.0 - 100.0 fL   MCH 31.5 26.0 - 34.0 pg   MCHC 30.9 30.0 - 36.0 g/dL   RDW 13.6 11.5 - 15.5 %   Platelets 163 150 - 400 K/uL   nRBC 0.0 0.0 - 0.2 %    Comment: Performed at [facility], [address] [place], [place] [address]  Magnesium     Status: None   Collection Time: 11/27/20  4:42 AM  Result Value Ref Range   Magnesium 2.2 1.7 - 2.4 mg/dL    Comment: Performed at [facility], [address] [place], [place] [address]  Phosphorus     Status: Abnormal   Collection Time: 11/27/20  4:42 AM  Result Value Ref Range   Phosphorus 6.0 (H) 2.5 - 4.6 mg/dL    Comment: Performed at [facility], [name], [place], [place] [address]  Glucose, capillary     Status: Abnormal   Collection Time: 11/27/20  7:19 AM  Result Value Ref Range   Glucose-Capillary 171 (H) 70 - 99 mg/dL    Comment: Glucose reference range applies only to samples taken after fasting for at least 8 hours.    DG Chest 2 View  Result Date: 11/27/2020 CLINICAL DATA:   50 year old female with history of pulmonary vascular congestion. EXAM: CHEST - 2 VIEW COMPARISON:  Chest x-ray 11/26/2020. FINDINGS: Left-sided internal jugular PermCath with tip terminating at the superior cavoatrial junction. Lung volumes are low. No consolidative airspace disease. Possible trace bilateral pleural effusions. No pneumothorax. There is cephalization of the pulmonary vasculature and slight indistinctness of the interstitial markings suggestive of mild pulmonary edema. Mild cardiomegaly. The patient is rotated to the left on today's exam, resulting in distortion of the mediastinal contours and reduced diagnostic sensitivity and specificity for mediastinal  pathology. Atherosclerotic calcifications in the thoracic aorta. IMPRESSION: 1. Low lung volumes with persistent (but improving) evidence of congestive heart failure, as above. 2. Aortic atherosclerosis. Electronically Signed   By: Vinnie Langton M.D.   On: 11/27/2020 06:06   DG Chest Portable 1 View  Result Date: 11/26/2020 CLINICAL DATA:  Severe shortness of breath, lethargy, arrived on CPAP, history hypertension, diabetes mellitus, renal disease on dialysis EXAM: PORTABLE CHEST 1 VIEW COMPARISON:  Portable exam 1108 hours compared to 10/21/2020 FINDINGS: LEFT jugular central venous catheter with tip projecting over cavoatrial junction. Enlargement of cardiac silhouette with pulmonary vascular congestion. Scattered infiltrates consistent with pulmonary edema. Lateral RIGHT lung base excluded. No gross pleural effusion or pneumothorax. IMPRESSION: Enlargement of cardiac silhouette with pulmonary vascular congestion and probable mild pulmonary edema. Electronically Signed   By: Lavonia Dana M.D.   On: 11/26/2020 11:34    ROS: LLQ cramping-  sinus congestion that is clear Blood pressure (!) 178/66, pulse (!) 49, temperature 98.5 F (36.9 C), temperature source Oral, resp. rate 20, height '5\' 5"'$  (1.651 m), weight (!) 163.8 kg, SpO2 100  %. General appearance: alert and no distress Resp: diminished breath sounds bilaterally Cardio: regular rate and rhythm, S1, S2 normal, no murmur, click, rub or gallop GI: soft, non-tender; bowel sounds normal; no masses,  no organomegaly Extremities: edema 2+  in lower legs TDC in left chest-  clean and dry   Assessment/Plan: 50 year old BF with many medical issues including ESRD-  admitted with volume overload severe enough to require bipap and nitro drip 1 SOB-  c/w volume overload - out of critical condition with removal of 2900 yesterday but still have a ways to go-  HD again today with more removal 2 ESRD: normally TTS via [facility]-  signs off and dictates the parameters of her treatment and that is an issue-  got her to agree that most likely her EDW should be lower-  agrees in theory to more volume removal today.  Done yesterday extra and plan on HD today on schedule 3 Hypertension: malignant and impacted by the extra volume-  is better than on admission-  amlodipine and coreg.... 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 Metabolic Bone Disease: will continue her OP meds of calcitriol and sensipar -  no binder listed on med list-  last phos 6-  no action for now 6. Sinus congestion-  no WBC-  on claritin and mucinex  Courtney Gray 11/27/2020, 8:33 AM

## 2020-11-27 NOTE — Progress Notes (Signed)
Second attempt to put BIPAP on patient

## 2020-11-27 NOTE — Progress Notes (Signed)
Pt refused to drink oral contrast. Pt informed that abd CT scan can not be performed without drinking contrast. Pt aware and still refused. Also, refused sq heparin. Pt educated on importance of taking heparin as ordered including all risks associated with not taking. Teach back method used to verify understanding. Pt still refusedsq heparin at this time. Bp elevated >200.  Attending notified. Prn bp med requested. Waiting for medication verification and will give per orders.

## 2020-11-28 LAB — GLUCOSE, CAPILLARY
Glucose-Capillary: 136 mg/dL — ABNORMAL HIGH (ref 70–99)
Glucose-Capillary: 132 mg/dL — ABNORMAL HIGH (ref 70–99)

## 2020-11-28 MED ORDER — LABETALOL HCL 200 MG PO TABS
100.0000 mg | ORAL_TABLET | Freq: Two times a day (BID) | ORAL | Status: DC
  Administered 2020-11-28: 100 mg via ORAL
  Filled 2020-11-28: qty 1

## 2020-11-28 MED ORDER — PANTOPRAZOLE SODIUM 40 MG PO TBEC: 40.0000 mg | DELAYED_RELEASE_TABLET | Freq: Every day | ORAL | 5 refills | Status: DC

## 2020-11-28 MED ORDER — ATORVASTATIN CALCIUM 10 MG PO TABS: 10.0000 mg | ORAL_TABLET | Freq: Every day | ORAL | 5 refills | Status: DC

## 2020-11-28 MED ORDER — AMLODIPINE BESYLATE 10 MG PO TABS: 10.0000 mg | ORAL_TABLET | Freq: Every day | ORAL | 5 refills | Status: DC

## 2020-11-28 MED ORDER — CARVEDILOL 25 MG PO TABS: 25.0000 mg | ORAL_TABLET | Freq: Two times a day (BID) | ORAL | 3 refills | Status: DC

## 2020-11-28 MED ORDER — ONDANSETRON HCL 4 MG/2ML IJ SOLN
4.0000 mg | Freq: Once | INTRAMUSCULAR | Status: AC
  Administered 2020-11-28: 4 mg via INTRAVENOUS

## 2020-11-28 MED ORDER — CINACALCET HCL 30 MG PO TABS: 30.0000 mg | ORAL_TABLET | ORAL | 4 refills | Status: AC

## 2020-11-28 MED ORDER — ISOSORBIDE MONONITRATE ER 30 MG PO TB24: 30.0000 mg | ORAL_TABLET | Freq: Every day | ORAL | 11 refills | Status: DC

## 2020-11-28 MED ORDER — AMLODIPINE BESYLATE 5 MG PO TABS
10.0000 mg | ORAL_TABLET | Freq: Every day | ORAL | Status: DC
  Administered 2020-11-28: 10 mg via ORAL
  Filled 2020-11-28: qty 2

## 2020-11-28 MED ORDER — LISINOPRIL 20 MG PO TABS: 20.0000 mg | ORAL_TABLET | Freq: Every day | ORAL | 3 refills | Status: DC

## 2020-11-28 MED ORDER — ASPIRIN EC 81 MG PO TBEC: 81.0000 mg | DELAYED_RELEASE_TABLET | Freq: Every day | ORAL | 11 refills | Status: DC

## 2020-11-28 NOTE — Discharge Summary (Signed)
Courtney Gray, is a 50 y.o. female  DOB 1970-08-30  MRN [id number].  Admission date:  11/26/2020  Admitting Physician  Barton Dubois, MD  Discharge Date:  11/28/2020   Primary MD  Medicine, [facility] Internal  Recommendations for primary care physician for things to follow:   1)Very low-salt diet advised 2)Weigh yourself daily, call if you gain more than 3 pounds in 1 day or more than 5 pounds in 1 week as your Hemodialysis may need to be adjusted 3)Limit your Fluid  intake to no more than 40 ounces (1.2 Liters) per day 4)Your blood pressure medications have been adjusted please note several changes   Admission Diagnosis  Flash pulmonary edema ([place]) [J81.0] Acute respiratory failure with hypoxia ([place]) [J96.01] Pulmonary vascular congestion [R09.89]   Discharge Diagnosis  Flash pulmonary edema ([place]) [J81.0] Acute respiratory failure with hypoxia ([place]) [J96.01] Pulmonary vascular congestion [R09.89]    Principal Problem:   Acute respiratory failure with hypoxia (HCC) Active Problems:   ESRD (end stage renal disease) (HCC)   Chronic respiratory failure with hypoxia (HCC)   Hypertensive urgency   Type 2 diabetes mellitus with other specified complication ([place])   Morbid obesity ([place])   Hypertensive emergency      Past Medical History:  Diagnosis Date   Diabetes mellitus without complication ([place])    High cholesterol    Hypertension    Renal disorder     Past Surgical History:  Procedure Laterality Date   AV FISTULA PLACEMENT     x2, unsuccessful.    IR FLUORO GUIDE CV LINE RIGHT  08/28/2020   IR PERC TUN PERIT CATH WO PORT S&I /IMAG  09/06/2020   IR US GUIDE VASC ACCESS LEFT  09/06/2020     HPI  from the history and physical done on the day of admission:      Chief Complaint: Shortness of breath, hypoxia and orthopnea   HPI: Courtney Gray is a 50 y.o. 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  Patient reports symptoms appears sudden in the last 24-48 hours and progressing.  She expressed experiencing intermittent frothy coughing spells and inability to lay down flat.  Patient reported no significant fluid removal during her scheduled dialysis on Saturday, 11/24/2020 and expressed increase fluid intake as she has been battling some sinusitis with increased dry mouth.  Patient reports no fever, no chills, no melena, no hematochezia, no chest pain, no palpitations, no focal weakness, no sick contacts.   Patient is now vaccinated against COVID   ED Course: Significantly hypoxic and requiring BiPAP to maintain O2 sats on presentation to the emergency department.  Patient also noted to have hypertensive urgency with flat elevation in troponins.  Chest x-ray with positive vascular congestion and interstitial edema.  Nephrology service was consulted for emergent dialysis.  TRH has been called to place patient in the hospital for further evaluation and management.   Review of Systems: As per HPI otherwise all other systems reviewed and are negative.     Hospital Course:     Assessment & Plan: 1-acute on chronic respiratory failure with hypoxia -In the setting of pulmonary edema and vascular congestion. Plan was for serial/back-to-back hemodialysis on 11/26/2020 and 11/27/2020 -Patient signed off prematurely from hemodialysis and HD, she signed [facility] waiver each day on 11/26/2020 and 11/27/2020 -Chest x-ray done before hemodialysis on 11/27/2020 showed improved CHF -On 11/28/2020--patient requested discharge home so wanted to do  outpatient dialysis instead -Patient says she was breathing better, she felt better, she was able to come off oxygen   2-hypertensive urgency--resolved -Patient off nitro drip -BP control improved significantly after back-to-back hemodialysis sessions, okay to discharge on amlodipine, Coreg, isosorbide and lisinopril   3-elevated troponin -In the setting of demand ischemia -Patient reports no chest pain. -2D echo reassuring, preserved ejection fraction and no wall motion abnormalities.  There is moderate left ventricle hypertrophy but diastolic parameters are indeterminate. -No ACS type symptoms   4-morbid obesity -Body mass index is 60.09 kg/m. -Low calorie diet and portion control discussed with patient.   5-type 2 diabetes with nephropathy -Recent A1c 6.6 reflecting excellent diabetic control PTA -Continue PTA diabetic regimen   6-ESRD/anemia of chronic kidney disease -No overt bleeding appreciated -Epogen therapy as per nephrology discretion -Continue to follow nephrology recommendations for dialysis management   7-secondary hyperparathyroidism -Continue Hectorol and Sensipar   8-gastroesophageal reflux disease -Continue Protonix  9-chronic neuropathic pain -Continue the use of Neurontin and as needed tizanidine.   10-hyperlipidemia -Continue Lipitor.   11-allergy rhinitis -Continue Claritin, Mucinex and as needed saline spray.     Code Status: Full code. Family Communication: Patient insisted on leaving  disposition:   Dispo: The patient is from: Home              Anticipated d/c is to: Home               Consultants:  Nephrology service   Discharge Condition: Stable  Follow UP--PCP and nephrologist  Diet and Activity recommendation:  As advised  Discharge Instructions    Discharge Instructions     Call MD for:  difficulty breathing, headache or visual disturbances   Complete by: As directed    Call MD for:  persistant dizziness or  light-headedness   Complete by: As directed    Call MD for:  persistant nausea and vomiting   Complete by: As directed    Call MD for:  temperature >100.4   Complete by: As directed    Diet - low sodium heart healthy   Complete by: As directed    Diet Carb Modified   Complete by: As directed    Discharge instructions   Complete by: As directed    1)Very low-salt diet advised 2)Weigh yourself daily, call if you gain more than 3 pounds in 1 day or more than 5 pounds in 1 week as your Hemodialysis may need to be adjusted 3)Limit your Fluid  intake to no more than 40 ounces (1.2 Liters) per day 4)Your blood pressure medications have been adjusted please note several changes   Increase activity slowly   Complete by: As directed    No wound care   Complete by: As directed          Discharge Medications  Allergies as of 11/28/2020       Reactions   Benzonatate Other (See Comments)   Hallucinations   Hydralazine Other (See Comments)   Hallucinations   Amoxicillin Nausea And Vomiting   Clonidine Other (See Comments)   Altered mental state   Morphine Itching   Oxycodone Itching        Medication List     STOP taking these medications    famotidine 20 MG tablet Commonly known as: PEPCID       TAKE these medications    acetaminophen 500 MG tablet Commonly known as: TYLENOL Take 1,000 mg by mouth every 6 (six) hours as needed for moderate pain or headache.   amLODipine 10 MG tablet Commonly known as: NORVASC Take 1 tablet (10 mg total) by mouth daily. Start taking on: November 29, 2020 What changed:  when to take this additional instructions   aspirin EC 81 MG tablet Take 1 tablet (81 mg total) by mouth daily with breakfast. What changed: when to take this   atorvastatin 10 MG tablet Commonly known as: LIPITOR Take 1 tablet (10 mg total) by mouth daily.   carvedilol 25 MG tablet Commonly known as: COREG Take 1 tablet (25 mg total) by mouth 2 (two)  times daily.   cinacalcet 30 MG tablet Commonly known as: SENSIPAR Take 1 tablet (30 mg total) by mouth Every Tuesday,Thursday,and Saturday with dialysis. Start taking on: November 29, 2020   diphenhydrAMINE 25 MG tablet Commonly known as: BENADRYL Take 50 mg by mouth daily as needed for allergies.   docusate sodium 100 MG capsule Commonly known as: COLACE Take 100 mg by mouth daily.   doxercalciferol 4 MCG/2ML injection Commonly known as: HECTOROL Inject 2.5 mLs (5 mcg total) into the vein Every Tuesday,Thursday,and Saturday with dialysis.   gabapentin 100 MG capsule Commonly known as: NEURONTIN Take 100-200 mg by mouth See admin instructions. Take 100 mg twice daily on non dialysis days.  Take 100 mg at 9am before dialysis, '100mg'$  after dialysis at 5pm, and '100mg'$  at 1am on Tuesdays, Thursdays,and Saturdays.   GAS-X PO Take 1 tablet by mouth daily as needed (gas).   isosorbide mononitrate 30 MG 24 hr tablet Commonly known as: IMDUR Take 1 tablet (30 mg total) by mouth daily.   Lantus SoloStar 100 UNIT/ML Solostar Pen Generic drug: insulin glargine Inject 12 Units into the skin at bedtime.   lisinopril 20 MG tablet Commonly known as: ZESTRIL Take 1 tablet (20 mg total) by mouth daily. What changed:  medication strength how much to take   loperamide 2 MG tablet Commonly known as: IMODIUM A-D Take 2 mg by mouth 4 (four) times daily as needed for diarrhea or loose stools.   nitroGLYCERIN 0.4 MG SL tablet Commonly known as: NITROSTAT Place 0.4 mg under the tongue every 5 (five) minutes as needed for chest pain.   pantoprazole 40 MG tablet Commonly known as: PROTONIX Take 1 tablet (40 mg total) by mouth daily. Start taking on: November 29, 2020   sodium chloride 0.65 % Soln nasal spray Commonly known as: OCEAN Place 1 spray into both nostrils in the morning and at bedtime.   tiZANidine 4 MG tablet Commonly known as: ZANAFLEX Take 4 mg by mouth every 8 (eight)  hours.        Major procedures and Radiology Reports - PLEASE review detailed and final reports for all details, in brief -   DG Chest 2 View  Result Date: 11/27/2020 CLINICAL DATA:  50 year old female with history of pulmonary vascular congestion. EXAM: CHEST - 2 VIEW COMPARISON:  Chest x-ray 11/26/2020. FINDINGS: Left-sided internal jugular PermCath with tip terminating at the superior cavoatrial junction. Lung volumes are low. No consolidative airspace disease. Possible trace bilateral pleural effusions. No pneumothorax. There is cephalization of the pulmonary vasculature and slight indistinctness of the interstitial markings suggestive of mild pulmonary edema. Mild cardiomegaly. The patient is rotated to the left on today's exam, resulting in distortion of the mediastinal contours and reduced diagnostic sensitivity and specificity for mediastinal pathology. Atherosclerotic calcifications in the thoracic aorta. IMPRESSION: 1. Low lung volumes with persistent (but improving) evidence of congestive heart failure, as above. 2. Aortic atherosclerosis. Electronically Signed   By: Vinnie Langton M.D.   On: 11/27/2020 06:06   DG Chest Portable 1 View  Result Date: 11/26/2020 CLINICAL DATA:  Severe shortness of breath, lethargy, arrived on CPAP, history hypertension, diabetes mellitus, renal disease on dialysis EXAM: PORTABLE CHEST 1 VIEW COMPARISON:  Portable exam 1108 hours compared to 10/21/2020 FINDINGS: LEFT jugular central venous catheter with tip projecting over cavoatrial junction. Enlargement of cardiac silhouette with pulmonary vascular congestion. Scattered infiltrates consistent with pulmonary edema. Lateral RIGHT lung base excluded. No gross pleural effusion or pneumothorax. IMPRESSION: Enlargement of cardiac silhouette with pulmonary vascular congestion and probable mild pulmonary edema. Electronically Signed   By: Lavonia Dana M.D.   On: 11/26/2020 11:34   ECHOCARDIOGRAM  COMPLETE  Result Date: 11/27/2020    ECHOCARDIOGRAM REPORT   Patient Name:   LATITA INGALLS Date of Exam: 11/27/2020 Medical Rec #:  [id number]       Height:       65.0 in Accession #:    [id number]      Weight:       361.1 lb Date of Birth:  04-07-1971        BSA:          2.543 m Patient Age:    22 years        BP:           167/52 mmHg Patient Gender: F               HR:           51 bpm. Exam Location:  [facility] Procedure: 2D Echo, Cardiac Doppler and Color Doppler Indications:    Dyspnea  History:        Patient has no prior history of Echocardiogram examinations.                 Signs/Symptoms:Shortness of Breath, Dyspnea and ESRD, resp.                 failure; Risk Factors:morbid obesity, Hypertension, Diabetes and                 Dyslipidemia.  Sonographer:    Dustin Flock RDCS Referring Phys: [phone] CARLOS MADERA  Sonographer Comments: Technically difficult study due to poor echo windows and patient is morbidly obese. IMPRESSIONS  1. Left ventricular ejection fraction, by estimation, is 55 to 60%. The left ventricle has normal function. The left ventricle has no regional wall motion abnormalities. There is moderate left ventricular hypertrophy. Left ventricular diastolic parameters are indeterminate. Elevated left atrial pressure.  2. Right ventricular systolic function is normal. The right ventricular size is normal. Tricuspid regurgitation signal is inadequate for assessing PA pressure.  3. The mitral valve is abnormal. Mild mitral valve regurgitation. No evidence of mitral stenosis.  4.  The aortic valve is tricuspid. There is mild calcification of the aortic valve. There is mild thickening of the aortic valve. Aortic valve regurgitation is not visualized. No aortic stenosis is present. FINDINGS  Left Ventricle: Left ventricular ejection fraction, by estimation, is 55 to 60%. The left ventricle has normal function. The left ventricle has no regional wall motion abnormalities. The left ventricular  internal cavity size was normal in size. There is  moderate left ventricular hypertrophy. Left ventricular diastolic parameters are indeterminate. Elevated left atrial pressure. Right Ventricle: The right ventricular size is normal. No increase in right ventricular wall thickness. Right ventricular systolic function is normal. Tricuspid regurgitation signal is inadequate for assessing PA pressure. Left Atrium: Left atrial size was normal in size. Right Atrium: Right atrial size was normal in size. Pericardium: There is no evidence of pericardial effusion. Mitral Valve: The mitral valve is abnormal. There is mild thickening of the mitral valve leaflet(s). There is mild calcification of the mitral valve leaflet(s). Mild mitral annular calcification. Mild mitral valve regurgitation. No evidence of mitral valve stenosis. Tricuspid Valve: The tricuspid valve is normal in structure. Tricuspid valve regurgitation is mild . No evidence of tricuspid stenosis. Aortic Valve: The aortic valve is tricuspid. There is mild calcification of the aortic valve. There is mild thickening of the aortic valve. There is mild aortic valve annular calcification. Aortic valve regurgitation is not visualized. No aortic stenosis  is present. Aortic valve mean gradient measures 8.0 mmHg. Aortic valve peak gradient measures 16.6 mmHg. Aortic valve area, by VTI measures 2.03 cm. Pulmonic Valve: The pulmonic valve was not well visualized. Pulmonic valve regurgitation is not visualized. No evidence of pulmonic stenosis. Aorta: The aortic root is normal in size and structure. IAS/Shunts: The interatrial septum was not well visualized.  LEFT VENTRICLE PLAX 2D LVIDd:         4.88 cm  Diastology LVIDs:         3.48 cm  LV e' medial:    4.47 cm/s LV PW:         1.56 cm  LV E/e' medial:  23.5 LV IVS:        1.58 cm  LV e' lateral:   6.31 cm/s LVOT diam:     2.40 cm  LV E/e' lateral: 16.6 LV SV:         100 LV SV Index:   39 LVOT Area:     4.52 cm  RIGHT  VENTRICLE RV Basal diam:  3.72 cm RV S prime:     15.00 cm/s TAPSE (M-mode): 2.3 cm LEFT ATRIUM             Index       RIGHT ATRIUM           Index LA diam:        4.60 cm 1.81 cm/m  RA Area:     23.40 cm LA Vol (A2C):   83.8 ml 32.96 ml/m RA Volume:   75.50 ml  29.69 ml/m LA Vol (A4C):   75.9 ml 29.85 ml/m LA Biplane Vol: 85.5 ml 33.62 ml/m  AORTIC VALVE AV Area (Vmax):    2.19 cm AV Area (Vmean):   2.03 cm AV Area (VTI):     2.03 cm AV Vmax:           204.00 cm/s AV Vmean:          134.000 cm/s AV VTI:            0.492  m AV Peak Grad:      16.6 mmHg AV Mean Grad:      8.0 mmHg LVOT Vmax:         98.80 cm/s LVOT Vmean:        60.000 cm/s LVOT VTI:          0.221 m LVOT/AV VTI ratio: 0.45  AORTA Ao Root diam: 2.70 cm MITRAL VALVE MV Area (PHT): 1.87 cm     SHUNTS MV Decel Time: 405 msec     Systemic VTI:  0.22 m MV E velocity: 105.00 cm/s  Systemic Diam: 2.40 cm MV A velocity: 78.30 cm/s MV E/A ratio:  1.34 Carlyle Dolly MD Electronically signed by Carlyle Dolly MD Signature Date/Time: 11/27/2020/11:50:36 AM    Final     Micro Results   Recent Results (from the past 240 hour(s))  Resp Panel by RT-PCR (Flu A&B, Covid) Nasopharyngeal Swab     Status: None   Collection Time: 11/26/20  1:02 PM   Specimen: Nasopharyngeal Swab; Nasopharyngeal(NP) swabs in vial transport medium  Result Value Ref Range Status   SARS Coronavirus 2 by RT PCR NEGATIVE NEGATIVE Final    Comment: (NOTE) SARS-CoV-2 target nucleic acids are NOT DETECTED.  The SARS-CoV-2 RNA is generally detectable in upper respiratory specimens during the acute phase of infection. The lowest concentration of SARS-CoV-2 viral copies this assay can detect is 138 copies/mL. A negative result does not preclude SARS-Cov-2 infection and should not be used as the sole basis for treatment or other patient management decisions. A negative result may occur with  improper specimen collection/handling, submission of specimen other than  nasopharyngeal swab, presence of viral mutation(s) within the areas targeted by this assay, and inadequate number of viral copies(<138 copies/mL). A negative result must be combined with clinical observations, patient history, and epidemiological information. The expected result is Negative.  Fact Sheet for Patients:  EntrepreneurPulse.com.au  Fact Sheet for Healthcare Providers:  IncredibleEmployment.be  This test is no t yet approved or cleared by the [place] FDA and  has been authorized for detection and/or diagnosis of SARS-CoV-2 by FDA under an Emergency Use Authorization (EUA). This EUA will remain  in effect (meaning this test can be used) for the duration of the COVID-19 declaration under Section 564(b)(1) of the Act, 21 U.S.C.section 360bbb-3(b)(1), unless the authorization is terminated  or revoked sooner.       Influenza A by PCR NEGATIVE NEGATIVE Final   Influenza B by PCR NEGATIVE NEGATIVE Final    Comment: (NOTE) The Xpert Xpress SARS-CoV-2/FLU/RSV plus assay is intended as an aid in the diagnosis of influenza from Nasopharyngeal swab specimens and should not be used as a sole basis for treatment. Nasal washings and aspirates are unacceptable for Xpert Xpress SARS-CoV-2/FLU/RSV testing.  Fact Sheet for Patients: EntrepreneurPulse.com.au  Fact Sheet for Healthcare Providers: IncredibleEmployment.be  This test is not yet approved or cleared by the [place] FDA and has been authorized for detection and/or diagnosis of SARS-CoV-2 by FDA under an Emergency Use Authorization (EUA). This EUA will remain in effect (meaning this test can be used) for the duration of the COVID-19 declaration under Section 564(b)(1) of the Act, 21 U.S.C. section 360bbb-3(b)(1), unless the authorization is terminated or revoked.  Performed at [facility], [address] [place], [place] [address]   MRSA  Next Gen by PCR, Nasal     Status: None   Collection Time: 11/26/20  6:51 PM   Specimen: Nasal Mucosa; Nasal Swab  Result Value Ref Range Status   MRSA by PCR Next Gen NOT DETECTED NOT DETECTED Final    Comment: (NOTE) The GeneXpert MRSA Assay (FDA approved for NASAL specimens only), is one component of a comprehensive MRSA colonization surveillance program. It is not intended to diagnose MRSA infection nor to guide or monitor treatment for MRSA infections. Test performance is not FDA approved in patients less than 46 years old. Performed at [facility], [address] [place], [place] [address]        Today   Subjective    Irha Arnet today has no new complaints No fever  Or chills  -Chest pains no palpitations no dizziness no shortness of breath, denies orthopnea or dyspnea on exertion -Patient requesting discharge home -She prefers outpatient hemodialysis          Patient has been seen and examined prior to discharge   Objective   Blood pressure (!) 177/62, pulse (!) 58, temperature 98.4 F (36.9 C), temperature source Oral, resp. rate 16, height '5\' 5"'$  (1.651 m), weight (!) 163 kg, SpO2 100 %.   Intake/Output Summary (Last 24 hours) at 11/28/2020 1226 Last data filed at 11/28/2020 0200 Gross per 24 hour  Intake 33 ml  Output 2667 ml  Net -2634 ml    Exam Gen:- Awake Alert, morbidly obese, no acute distress, speaking in complete sentences HEENT:- [place].AT, No sclera icterus Neck-Supple Neck,No JVD,.  Lungs-fair symmetrical air movement, no wheezing  CV- S1, S2 normal, regular Abd-  +ve B.Sounds, Abd Soft, No tenderness,    Extremity/Skin:-trace  edema,   good pulses Psych-affect is appropriate, oriented x3 Neuro-no new focal deficits, no tremors    Data Review   CBC w Diff:  Lab Results  Component Value Date   WBC 10.4 11/27/2020   HGB 10.3 (L) 11/27/2020   HCT 33.3 (L) 11/27/2020   PLT 163 11/27/2020   LYMPHOPCT 11 11/26/2020   MONOPCT 6 11/26/2020    EOSPCT 3 11/26/2020   BASOPCT 0 11/26/2020    CMP:  Lab Results  Component Value Date   NA 134 (L) 11/27/2020   K 4.1 11/27/2020   CL 97 (L) 11/27/2020   CO2 21 (L) 11/27/2020   BUN 48 (H) 11/27/2020   CREATININE 10.40 (H) 11/27/2020   PROT 8.9 (H) 11/26/2020   ALBUMIN 4.3 11/26/2020   BILITOT 0.7 11/26/2020   ALKPHOS 64 11/26/2020   AST 16 11/26/2020   ALT 20 11/26/2020  .  Total Discharge time is about 33 minutes  Roxan Hockey M.D on 11/28/2020 at 12:26 PM  Go to [email or web address] -  for contact info  [facility] - Office  [phone]

## 2020-11-28 NOTE — Progress Notes (Signed)
Pt ready for d/c, reviewed d/c plan and new medications with pt, PIV removed, belongings returned. No distress noted.

## 2020-11-28 NOTE — Plan of Care (Signed)
Pt is alert and oriented, denies pain. Pt expressed that she did not feel as tho she is receiving the appropriate care. When ask to elaborate pt stated that she was concerned that she is at her usual dry weight following dialysis but is concerned that she may become fluid overloaded again. I asked pt what her fluid allowance is, she stated 1L, I asked pt if she had been going over 1L just prior to hospitalization. Pt stated that she was drinking extra fluid due to congestion. I assured pt that she is at the desired dry weight as well as educated pt to the importance of maintaining strict control of her fluid intake. She expressed that she understood.   Problem: Education: Goal: Knowledge of General Education information will improve Description: Including pain rating scale, medication(s)/side effects and non-pharmacologic comfort measures Outcome: Progressing   Problem: Health Behavior/Discharge Planning: Goal: Ability to manage health-related needs will improve Outcome: Progressing   Problem: Clinical Measurements: Goal: Ability to maintain clinical measurements within normal limits will improve Outcome: Progressing Goal: Will remain free from infection Outcome: Progressing Goal: Diagnostic test results will improve Outcome: Progressing Goal: Respiratory complications will improve Outcome: Progressing Goal: Cardiovascular complication will be avoided Outcome: Progressing   Problem: Activity: Goal: Risk for activity intolerance will decrease Outcome: Progressing   Problem: Nutrition: Goal: Adequate nutrition will be maintained Outcome: Progressing   Problem: Coping: Goal: Level of anxiety will decrease Outcome: Progressing   Problem: Elimination: Goal: Will not experience complications related to bowel motility Outcome: Progressing Goal: Will not experience complications related to urinary retention Outcome: Progressing   Problem: Pain Managment: Goal: General experience of  comfort will improve Outcome: Progressing   Problem: Safety: Goal: Ability to remain free from injury will improve Outcome: Progressing   Problem: Skin Integrity: Goal: Risk for impaired skin integrity will decrease Outcome: Progressing

## 2020-11-28 NOTE — Discharge Instructions (Signed)
1)Very low-salt diet advised 2)Weigh yourself daily, call if you gain more than 3 pounds in 1 day or more than 5 pounds in 1 week as your Hemodialysis may need to be adjusted 3)Limit your Fluid  intake to no more than 40 ounces (1.2 Liters) per day 4)Your blood pressure medications have been adjusted please note several changes

## 2020-11-28 NOTE — Progress Notes (Signed)
Patient ID: Courtney Gray, female   DOB: 1970/12/12, 50 y.o.   MRN: [id number]  [facility] Progress Note    Subjective:   Feels better   Objective:   BP (!) 214/86   Pulse (!) 50   Temp 98.4 F (36.9 C) (Oral)   Resp 18   Ht '5\' 5"'$  (1.651 m)   Wt (!) 163 kg   SpO2 100%   BMI 59.80 kg/m   Intake/Output: I/O last 3 completed shifts: In: 60 [P.O.:30; Other:3] Out: 2667 [Other:2667]   Intake/Output this shift:  No intake/output data recorded. Weight change: 0 kg  Physical Exam: Gen: NAD, morbidly obese CVS:RRR Resp:CTA Abd: obese, +BS, soft, NT/ND Ext: trace pretibial edema  Labs: BMET Recent Labs  Lab 11/26/20 1111 11/26/20 1122 11/26/20 1138 11/27/20 0442  NA 134* 135  --  134*  K 4.7 7.4* 4.5 4.1  CL 98 104  --  97*  CO2 24  --   --  21*  GLUCOSE 202* 200*  --  138*  BUN 57* 88*  --  48*  CREATININE 11.52* 12.70*  --  10.40*  ALBUMIN 4.3  --   --   --   CALCIUM 7.9*  --   --  7.3*  PHOS  --   --   --  6.0*   CBC Recent Labs  Lab 11/26/20 1111 11/26/20 1122 11/27/20 0442  WBC 13.6*  --  10.4  NEUTROABS 10.7*  --   --   HGB 13.1 15.0 10.3*  HCT 42.5 44.0 33.3*  MCV 101.4*  --  101.8*  PLT 222  --  163      Medications:     amLODipine  10 mg Oral Daily   aspirin EC  81 mg Oral Daily   atorvastatin  10 mg Oral Daily   calcitRIOL  1 mcg Oral Q T,Th,Sa-HD   carvedilol  25 mg Oral BID   Chlorhexidine Gluconate Cloth  6 each Topical Q0600   Chlorhexidine Gluconate Cloth  6 each Topical Q0600   cinacalcet  30 mg Oral Q T,Th,Sa-HD   darbepoetin (ARANESP) injection - DIALYSIS  60 mcg Intravenous Q Tue-HD   dextromethorphan-guaiFENesin  1 tablet Oral BID   docusate sodium  100 mg Oral Daily   gabapentin  100 mg Oral 2 times per day on Sun Mon Wed Fri   gabapentin  100 mg Oral 3 times per day on Tue Thu Sat   heparin injection (subcutaneous)  5,000 Units Subcutaneous Q8H   insulin aspart  0-5 Units Subcutaneous QHS   insulin  aspart  0-9 Units Subcutaneous TID WC   insulin glargine  10 Units Subcutaneous QHS   labetalol  100 mg Oral BID   loratadine  10 mg Oral Daily   pantoprazole  40 mg Oral Daily   tiZANidine  4 mg Oral Q8H   Dialyzes at [facility]-  TTS - 4 hours  EDW 163- but not getting to-  left out on Sat at 166.6. HD Bath 1K/2.5 calc, Dialyzer rev300, Heparin load 2600- then 600 per hour. Access [facility] (refuses other ) -  400 BFR but refuses that high.  Epo 7600, calcitriol 1 mcg, sensipar 30 q treatment   Assessment/ Plan:   Acute hypoxic respiratory failure - due to volume overload.  Markedly improved with serial dialysis. ESRD - continue with TTS schedule at [facility]. Anemia:stable CKD-MBD: continue with meds Nutrition: renal diet Hypertension/Volume - pt with large idwg and s/o dialysis  early.  She will likely require longer HD as an outpatient and/or better fluid restriction. Disposition - stable for discharge and for outpatient HD tomorrow.   Donetta Potts, MD [place] Pager [phone] 11/28/2020, 10:52 AM

## 2020-11-29 DIAGNOSIS — N2581 Secondary hyperparathyroidism of renal origin: Secondary | ICD-10-CM | POA: Diagnosis not present

## 2020-11-29 DIAGNOSIS — Z992 Dependence on renal dialysis: Secondary | ICD-10-CM | POA: Diagnosis not present

## 2020-11-29 DIAGNOSIS — N186 End stage renal disease: Secondary | ICD-10-CM | POA: Diagnosis not present

## 2020-12-01 DIAGNOSIS — Z992 Dependence on renal dialysis: Secondary | ICD-10-CM | POA: Diagnosis not present

## 2020-12-01 DIAGNOSIS — N186 End stage renal disease: Secondary | ICD-10-CM | POA: Diagnosis not present

## 2020-12-01 DIAGNOSIS — N2581 Secondary hyperparathyroidism of renal origin: Secondary | ICD-10-CM | POA: Diagnosis not present

## 2020-12-04 DIAGNOSIS — N2581 Secondary hyperparathyroidism of renal origin: Secondary | ICD-10-CM | POA: Diagnosis not present

## 2020-12-04 DIAGNOSIS — Z992 Dependence on renal dialysis: Secondary | ICD-10-CM | POA: Diagnosis not present

## 2020-12-04 DIAGNOSIS — N186 End stage renal disease: Secondary | ICD-10-CM | POA: Diagnosis not present

## 2020-12-05 DIAGNOSIS — R5383 Other fatigue: Secondary | ICD-10-CM | POA: Diagnosis not present

## 2020-12-05 DIAGNOSIS — E785 Hyperlipidemia, unspecified: Secondary | ICD-10-CM | POA: Diagnosis not present

## 2020-12-06 DIAGNOSIS — E8770 Fluid overload, unspecified: Secondary | ICD-10-CM | POA: Diagnosis not present

## 2020-12-06 DIAGNOSIS — N2581 Secondary hyperparathyroidism of renal origin: Secondary | ICD-10-CM | POA: Diagnosis not present

## 2020-12-06 DIAGNOSIS — N186 End stage renal disease: Secondary | ICD-10-CM | POA: Diagnosis not present

## 2020-12-06 DIAGNOSIS — Z992 Dependence on renal dialysis: Secondary | ICD-10-CM | POA: Diagnosis not present

## 2020-12-06 DIAGNOSIS — E8779 Other fluid overload: Secondary | ICD-10-CM | POA: Diagnosis not present

## 2020-12-07 DIAGNOSIS — N186 End stage renal disease: Secondary | ICD-10-CM | POA: Diagnosis not present

## 2020-12-07 DIAGNOSIS — E1165 Type 2 diabetes mellitus with hyperglycemia: Secondary | ICD-10-CM | POA: Diagnosis not present

## 2020-12-07 DIAGNOSIS — Z299 Encounter for prophylactic measures, unspecified: Secondary | ICD-10-CM | POA: Diagnosis not present

## 2020-12-07 DIAGNOSIS — J309 Allergic rhinitis, unspecified: Secondary | ICD-10-CM | POA: Diagnosis not present

## 2020-12-07 DIAGNOSIS — E1122 Type 2 diabetes mellitus with diabetic chronic kidney disease: Secondary | ICD-10-CM | POA: Diagnosis not present

## 2020-12-08 ENCOUNTER — Emergency Department (HOSPITAL_COMMUNITY)
Admission: EM | Admit: 2020-12-08 | Discharge: 2020-12-09 | Disposition: A | Discharge status: Home / Self Care | Payer: Medicare Other | Attending: Emergency Medicine | Admitting: Emergency Medicine

## 2020-12-08 ENCOUNTER — Encounter (HOSPITAL_COMMUNITY): Payer: Self-pay | Admitting: Emergency Medicine

## 2020-12-08 ENCOUNTER — Other Ambulatory Visit: Payer: Self-pay

## 2020-12-08 ENCOUNTER — Emergency Department (HOSPITAL_COMMUNITY): Payer: Medicare Other

## 2020-12-08 DIAGNOSIS — Z794 Long term (current) use of insulin: Secondary | ICD-10-CM | POA: Insufficient documentation

## 2020-12-08 DIAGNOSIS — T8249XA Other complication of vascular dialysis catheter, initial encounter: Secondary | ICD-10-CM | POA: Insufficient documentation

## 2020-12-08 DIAGNOSIS — E8779 Other fluid overload: Secondary | ICD-10-CM | POA: Diagnosis not present

## 2020-12-08 DIAGNOSIS — Z743 Need for continuous supervision: Secondary | ICD-10-CM | POA: Diagnosis not present

## 2020-12-08 DIAGNOSIS — Z992 Dependence on renal dialysis: Secondary | ICD-10-CM | POA: Insufficient documentation

## 2020-12-08 DIAGNOSIS — I120 Hypertensive chronic kidney disease with stage 5 chronic kidney disease or end stage renal disease: Secondary | ICD-10-CM | POA: Diagnosis not present

## 2020-12-08 DIAGNOSIS — Z79899 Other long term (current) drug therapy: Secondary | ICD-10-CM | POA: Diagnosis not present

## 2020-12-08 DIAGNOSIS — T829XXA Unspecified complication of cardiac and vascular prosthetic device, implant and graft, initial encounter: Secondary | ICD-10-CM

## 2020-12-08 DIAGNOSIS — J90 Pleural effusion, not elsewhere classified: Secondary | ICD-10-CM | POA: Diagnosis not present

## 2020-12-08 DIAGNOSIS — J9811 Atelectasis: Secondary | ICD-10-CM | POA: Diagnosis not present

## 2020-12-08 DIAGNOSIS — Z7982 Long term (current) use of aspirin: Secondary | ICD-10-CM | POA: Diagnosis not present

## 2020-12-08 DIAGNOSIS — N186 End stage renal disease: Secondary | ICD-10-CM | POA: Insufficient documentation

## 2020-12-08 DIAGNOSIS — E1122 Type 2 diabetes mellitus with diabetic chronic kidney disease: Secondary | ICD-10-CM | POA: Diagnosis not present

## 2020-12-08 DIAGNOSIS — N2581 Secondary hyperparathyroidism of renal origin: Secondary | ICD-10-CM | POA: Diagnosis not present

## 2020-12-08 DIAGNOSIS — I517 Cardiomegaly: Secondary | ICD-10-CM | POA: Diagnosis not present

## 2020-12-08 DIAGNOSIS — R6889 Other general symptoms and signs: Secondary | ICD-10-CM | POA: Diagnosis not present

## 2020-12-08 DIAGNOSIS — E8770 Fluid overload, unspecified: Secondary | ICD-10-CM | POA: Diagnosis not present

## 2020-12-08 LAB — CBC
HCT: 33.1 % — ABNORMAL LOW (ref 36.0–46.0)
Hemoglobin: 10.2 g/dL — ABNORMAL LOW (ref 12.0–15.0)
MCH: 31.6 pg (ref 26.0–34.0)
MCHC: 30.8 g/dL (ref 30.0–36.0)
MCV: 102.5 fL — ABNORMAL HIGH (ref 80.0–100.0)
Platelets: 161 10*3/uL (ref 150–400)
RBC: 3.23 MIL/uL — ABNORMAL LOW (ref 3.87–5.11)
RDW: 13.4 % (ref 11.5–15.5)
WBC: 11.8 10*3/uL — ABNORMAL HIGH (ref 4.0–10.5)
nRBC: 0 % (ref 0.0–0.2)

## 2020-12-08 LAB — BASIC METABOLIC PANEL
Anion gap: 12 (ref 5–15)
BUN: 67 mg/dL — ABNORMAL HIGH (ref 6–20)
CO2: 23 mmol/L (ref 22–32)
Calcium: 7.4 mg/dL — ABNORMAL LOW (ref 8.9–10.3)
Chloride: 102 mmol/L (ref 98–111)
Creatinine, Ser: 14.2 mg/dL — ABNORMAL HIGH (ref 0.44–1.00)
GFR, Estimated: 3 mL/min — ABNORMAL LOW (ref >60)
Glucose, Bld: 203 mg/dL — ABNORMAL HIGH (ref 70–99)
Potassium: 4.7 mmol/L (ref 3.5–5.1)
Sodium: 137 mmol/L (ref 135–145)

## 2020-12-08 LAB — DIFFERENTIAL
Abs Immature Granulocytes: 0.06 10*3/uL (ref 0.00–0.07)
Basophils Absolute: 0.1 10*3/uL (ref 0.0–0.1)
Basophils Relative: 0 %
Eosinophils Absolute: 0.4 10*3/uL (ref 0.0–0.5)
Eosinophils Relative: 4 %
Immature Granulocytes: 1 %
Lymphocytes Relative: 11 %
Lymphs Abs: 1.3 10*3/uL (ref 0.7–4.0)
Monocytes Absolute: 0.8 10*3/uL (ref 0.1–1.0)
Monocytes Relative: 7 %
Neutro Abs: 9.2 10*3/uL — ABNORMAL HIGH (ref 1.7–7.7)
Neutrophils Relative %: 77 %

## 2020-12-08 MED ORDER — HEPARIN SODIUM (PORCINE) 1000 UNIT/ML DIALYSIS
2600.0000 [IU] | INTRAMUSCULAR | Status: DC | PRN
  Administered 2020-12-08: 2600 [IU] via INTRAVENOUS_CENTRAL

## 2020-12-08 MED ORDER — ALTEPLASE 2 MG IJ SOLR
2.0000 mg | Freq: Once | INTRAMUSCULAR | Status: AC | PRN
  Administered 2020-12-08: 4 mg
  Filled 2020-12-08: qty 2

## 2020-12-08 MED ORDER — HEPARIN SODIUM (PORCINE) 1000 UNIT/ML DIALYSIS: 1000.0000 [IU] | INTRAMUSCULAR | Status: DC | PRN

## 2020-12-08 MED ORDER — CARVEDILOL 12.5 MG PO TABS
25.0000 mg | ORAL_TABLET | Freq: Once | ORAL | Status: AC
  Administered 2020-12-08: 25 mg via ORAL
  Filled 2020-12-08: qty 2

## 2020-12-08 MED ORDER — SODIUM CHLORIDE 0.9 % IV SOLN: 100.0000 mL | INTRAVENOUS | Status: DC | PRN

## 2020-12-08 MED ORDER — HEPARIN SODIUM (PORCINE) 1000 UNIT/ML IJ SOLN
INTRAMUSCULAR | Status: AC
  Administered 2020-12-08: 4200 [IU] via INTRAVENOUS_CENTRAL
  Filled 2020-12-08: qty 5

## 2020-12-08 MED ORDER — ALTEPLASE 2 MG IJ SOLR
INTRAMUSCULAR | Status: AC
  Filled 2020-12-08: qty 4

## 2020-12-08 MED ORDER — CHLORHEXIDINE GLUCONATE CLOTH 2 % EX PADS: 6.0000 | MEDICATED_PAD | Freq: Every day | CUTANEOUS | Status: DC

## 2020-12-08 NOTE — ED Provider Notes (Signed)
[facility] EMERGENCY DEPARTMENT Provider Note   CSN: [id number] Arrival date & time: 12/08/20  1721     History Chief Complaint  Patient presents with   Vascular Access Problem    Courtney Gray is a 50 y.o. female.  HPI 51 year old female presents with a dialysis catheter complication.  Since leaving the hospital she has been having issues with getting good flow rates to her left chest dialysis catheter.  This was placed back in June.  Apparently she had to have lower flow rates to get dialysis done.  They can only use one of the ports 2 days ago and then today could not get any access.  She was sent here for evaluation.  She denies any pain, shortness of breath, fevers, or any other new/concerning symptoms.  Past Medical History:  Diagnosis Date   Diabetes mellitus without complication ([place])    High cholesterol    Hypertension    Renal disorder     Patient Active Problem List   Diagnosis Date Noted   Bacteremia    Fever 09/01/2020   HTN (hypertension) 09/01/2020   ESRD (end stage renal disease) ([place]) 09/01/2020   Chronic respiratory failure with hypoxia ([place]) 09/01/2020   Respiratory distress    Hypertensive emergency    Respiratory failure ([place]) 08/28/2020   Acute respiratory failure with hypoxia ([place]) 11/28/2019   Fluid overload 11/28/2019   Hypertensive urgency 11/28/2019   Type 2 diabetes mellitus with other specified complication ([place]) [id number]   Anemia of chronic disease 11/28/2019   Morbid obesity ([place]) 11/28/2019    Past Surgical History:  Procedure Laterality Date   AV FISTULA PLACEMENT     x2, unsuccessful.    IR FLUORO GUIDE CV LINE RIGHT  08/28/2020   IR PERC TUN PERIT CATH WO PORT S&I /IMAG  09/06/2020   IR US GUIDE VASC ACCESS LEFT  09/06/2020     OB History     Gravida  2   Para  2   Term  2   Preterm      AB      Living  2      SAB      IAB      Ectopic      Multiple      Live Births              Family History   Problem Relation Age of Onset   Diabetes Mother    Diabetes Father     Social History   Tobacco Use   Smoking status: Never   Smokeless tobacco: Never  Vaping Use   Vaping Use: Never used  Substance Use Topics   Alcohol use: Never   Drug use: Never    Home Medications Prior to Admission medications   Medication Sig Start Date End Date Taking? Authorizing Provider  acetaminophen (TYLENOL) 500 MG tablet Take 1,000 mg by mouth every 6 (six) hours as needed for moderate pain or headache.    [provider]  amLODipine (NORVASC) 10 MG tablet Take 1 tablet (10 mg total) by mouth daily. 11/29/20   Roxan Hockey, MD  aspirin EC 81 MG tablet Take 1 tablet (81 mg total) by mouth daily with breakfast. 11/28/20   Denton Brick, Courage, MD  atorvastatin (LIPITOR) 10 MG tablet Take 1 tablet (10 mg total) by mouth daily. 11/28/20   Roxan Hockey, MD  carvedilol (COREG) 25 MG tablet Take 1 tablet (25 mg total) by mouth 2 (two) times daily. 11/28/20  Roxan Hockey, MD  cinacalcet (SENSIPAR) 30 MG tablet Take 1 tablet (30 mg total) by mouth Every Tuesday,Thursday,and Saturday with dialysis. 11/29/20 02/27/21  Roxan Hockey, MD  diphenhydrAMINE (BENADRYL) 25 MG tablet Take 50 mg by mouth daily as needed for allergies.    [provider]  docusate sodium (COLACE) 100 MG capsule Take 100 mg by mouth daily.    [provider]  doxercalciferol (HECTOROL) 4 MCG/2ML injection Inject 2.5 mLs (5 mcg total) into the vein Every Tuesday,Thursday,and Saturday with dialysis. 09/01/20   Enzo Bi, MD  gabapentin (NEURONTIN) 100 MG capsule Take 100-200 mg by mouth See admin instructions. Take 100 mg twice daily on non dialysis days.  Take 100 mg at 9am before dialysis, '100mg'$  after dialysis at 5pm, and '100mg'$  at 1am on Tuesdays, Thursdays,and Saturdays. 04/20/19   [provider]  isosorbide mononitrate (IMDUR) 30 MG 24 hr tablet Take 1 tablet (30 mg total) by mouth daily. 11/28/20  11/28/21  Roxan Hockey, MD  LANTUS SOLOSTAR 100 UNIT/ML Solostar Pen Inject 12 Units into the skin at bedtime.  03/18/19   [provider]  lisinopril (ZESTRIL) 20 MG tablet Take 1 tablet (20 mg total) by mouth daily. 11/28/20   Roxan Hockey, MD  loperamide (IMODIUM A-D) 2 MG tablet Take 2 mg by mouth 4 (four) times daily as needed for diarrhea or loose stools.    [provider]  nitroGLYCERIN (NITROSTAT) 0.4 MG SL tablet Place 0.4 mg under the tongue every 5 (five) minutes as needed for chest pain.    [provider]  pantoprazole (PROTONIX) 40 MG tablet Take 1 tablet (40 mg total) by mouth daily. 11/29/20   Roxan Hockey, MD  Simethicone (GAS-X PO) Take 1 tablet by mouth daily as needed (gas).    [provider]  sodium chloride (OCEAN) 0.65 % SOLN nasal spray Place 1 spray into both nostrils in the morning and at bedtime.    [provider]  tiZANidine (ZANAFLEX) 4 MG tablet Take 4 mg by mouth every 8 (eight) hours.  04/20/19   [provider]    Allergies    Benzonatate, Hydralazine, Amoxicillin, Clonidine, Morphine, and Oxycodone  Review of Systems   Review of Systems  Constitutional:  Negative for fever.  Respiratory:  Negative for shortness of breath.   Cardiovascular:  Negative for chest pain.  Skin:  Negative for color change.  All other systems reviewed and are negative.  Physical Exam Updated Vital Signs BP (!) 220/80   Pulse (!) 52   Temp 98.2 F (36.8 C) (Oral)   Resp 17   Ht '5\' 5"'$  (1.651 m)   Wt (!) 165 kg   SpO2 100%   BMI 60.53 kg/m   Physical Exam Vitals and nursing note reviewed.  Constitutional:      Appearance: She is well-developed. She is obese.  HENT:     Head: Normocephalic and atraumatic.     Right Ear: External ear normal.     Left Ear: External ear normal.     Nose: Nose normal.  Eyes:     General:        Right eye: No discharge.        Left eye: No discharge.  Cardiovascular:      Rate and Rhythm: Normal rate and regular rhythm.     Heart sounds: Normal heart sounds.  Pulmonary:     Effort: Pulmonary effort is normal.     Breath sounds: Normal breath sounds.  Abdominal:  Palpations: Abdomen is soft.     Tenderness: There is no abdominal tenderness.  Skin:    General: Skin is warm and dry.  Neurological:     Mental Status: She is alert.  Psychiatric:        Mood and Affect: Mood is not anxious.    ED Results / Procedures / Treatments   Labs (all labs ordered are listed, but only abnormal results are displayed) Labs Reviewed  BASIC METABOLIC PANEL - Abnormal; Notable for the following components:      Result Value   Glucose, Bld 203 (*)    BUN 67 (*)    Creatinine, Ser 14.20 (*)    Calcium 7.4 (*)    GFR, Estimated 3 (*)    All other components within normal limits  CBC - Abnormal; Notable for the following components:   WBC 11.8 (*)    RBC 3.23 (*)    Hemoglobin 10.2 (*)    HCT 33.1 (*)    MCV 102.5 (*)    All other components within normal limits  DIFFERENTIAL - Abnormal; Notable for the following components:   Neutro Abs 9.2 (*)    All other components within normal limits  CBC WITH DIFFERENTIAL/PLATELET    EKG None  Radiology DG Chest Portable 1 View  Result Date: 12/08/2020 CLINICAL DATA:  Dialysis catheter malfunction. EXAM: PORTABLE CHEST 1 VIEW COMPARISON:  11/27/2020 FINDINGS: Left-sided dialysis catheter unchanged in position with tip in the SVC. No discontinuity or kink. Similar cardiomegaly. Unchanged mediastinal contours. Aortic atherosclerosis. Vascular congestion with improvement from prior exam. Limited assessment for pleural effusion on this AP view. Mild bibasilar atelectasis. No pneumothorax. Stable osseous structures. IMPRESSION: 1. Left-sided dialysis catheter unchanged in position with tip in the SVC. No discontinuity or kink. 2. Unchanged cardiomegaly. Persistent but improving vascular congestion from prior. Electronically  Signed   By: Keith Rake M.D.   On: 12/08/2020 18:34    Procedures Procedures   Medications Ordered in ED Medications  Chlorhexidine Gluconate Cloth 2 % PADS 6 each (has no administration in time range)  0.9 %  sodium chloride infusion (has no administration in time range)  0.9 %  sodium chloride infusion (has no administration in time range)  heparin injection 1,000 Units (has no administration in time range)  heparin injection 2,600 Units (2,600 Units Dialysis Given 12/08/20 2020)  heparin sodium (porcine) 1000 UNIT/ML injection (has no administration in time range)  alteplase (CATHFLO ACTIVASE) injection 2 mg (4 mg Intracatheter Given 12/08/20 1900)  carvedilol (COREG) tablet 25 mg (25 mg Oral Given 12/08/20 2342)    ED Course  I have reviewed the triage vital signs and the nursing notes.  Pertinent labs & imaging results that were available during my care of the patient were reviewed by me and considered in my medical decision making (see chart for details).    MDM Rules/Calculators/A&P                           Nephrology consulted and nephrology nurse was able to come down and troubleshoot the catheter and take her to dialysis.  If the dialysis catheter is working for a full session and she can likely be discharged.  If it does not work she may need admission.  She is currently off the floor and care was transferred to Dr. Wyvonnia Dusky. Final Clinical Impression(s) / ED Diagnoses Final diagnoses:  Complication associated with dialysis catheter    Rx /  DC Orders ED Discharge Orders     None        Sherwood Gambler, MD 12/08/20 2350

## 2020-12-08 NOTE — Progress Notes (Signed)
Informed of patient in ER. Sent from home HD unit due to catheter malfunction. Discussed with HD RN, catheter flushing and aspirating without resistance. Markedly hypertensive but on RA sat'ing 100%. Persistent but improving vascular congestion with tip of TDC in IVC with no discontinuity or kinks on CXR today. Labs pending. Will attempt a short HD treatment today, 2.5 hrs with same outpatient heparin bolus (2,600 units). If any issues with catheter, then will need to get [facility] exchanged. Please contact us if patient is to be admitted for full consult.  Gean Quint, MD [facility]

## 2020-12-08 NOTE — ED Triage Notes (Signed)
Pt arrives via [facility] after being referred from dialysis center d/t port access problems. She was able to complete part of session on Thursday but went today and they were unable to give her dialysis. Port placed in June. Also noted to be hypertensive per EMS, 200/114 and then 180/80. Took HTN meds this morning.

## 2020-12-08 NOTE — ED Notes (Signed)
Pt states her sbp is normally 170s-190s at baseline despite taking bp meds (carvedilol, amlodipine, lisinopril - took all 3 this morning)

## 2020-12-09 DIAGNOSIS — Z7401 Bed confinement status: Secondary | ICD-10-CM | POA: Diagnosis not present

## 2020-12-09 DIAGNOSIS — T8249XA Other complication of vascular dialysis catheter, initial encounter: Secondary | ICD-10-CM | POA: Diagnosis not present

## 2020-12-09 DIAGNOSIS — R6889 Other general symptoms and signs: Secondary | ICD-10-CM | POA: Diagnosis not present

## 2020-12-09 DIAGNOSIS — Z743 Need for continuous supervision: Secondary | ICD-10-CM | POA: Diagnosis not present

## 2020-12-09 DIAGNOSIS — R279 Unspecified lack of coordination: Secondary | ICD-10-CM | POA: Diagnosis not present

## 2020-12-09 MED ORDER — GABAPENTIN 100 MG PO CAPS
100.0000 mg | ORAL_CAPSULE | Freq: Once | ORAL | Status: AC
  Administered 2020-12-09: 100 mg via ORAL
  Filled 2020-12-09: qty 1

## 2020-12-09 MED ORDER — ACETAMINOPHEN 500 MG PO TABS
1000.0000 mg | ORAL_TABLET | Freq: Once | ORAL | Status: DC
  Filled 2020-12-09: qty 2

## 2020-12-09 MED ORDER — TIZANIDINE HCL 4 MG PO TABS
4.0000 mg | ORAL_TABLET | Freq: Once | ORAL | Status: AC
  Administered 2020-12-09: 4 mg via ORAL
  Filled 2020-12-09: qty 1

## 2020-12-09 NOTE — Discharge Instructions (Addendum)
Take your medications as prescribed and follow-up with your dialysis center as scheduled.  Return to the ED with new or worsening symptoms

## 2020-12-09 NOTE — ED Notes (Signed)
Pt given sandwich, chips,

## 2020-12-09 NOTE — ED Provider Notes (Signed)
Patient returned from dialysis.  She tolerated well.  Dialysis staff reports her catheter is functioning appropriately.  Patient denies any chest pain or shortness of breath.  She was hypertensive and received her regular Coreg. She is requesting tizanidine and gabapentin.  She is not due for any other medications for blood pressure tonight.  No hypoxia on her home oxygen.  She feels that she is breathing comfortably.  No chest pain.  Discussed follow-up with her doctor.  Take your blood pressure medications as prescribed and continue her dialysis as scheduled.  Return precautions discussed   Ezequiel Essex, MD 12/09/20 [phone]

## 2020-12-09 NOTE — ED Notes (Signed)
Patient still currently awaiting discharge ride from [facility]

## 2020-12-09 NOTE — ED Notes (Signed)
Patient repositioned and placed in recliner in hallway

## 2020-12-11 DIAGNOSIS — E8770 Fluid overload, unspecified: Secondary | ICD-10-CM | POA: Diagnosis not present

## 2020-12-11 DIAGNOSIS — E8779 Other fluid overload: Secondary | ICD-10-CM | POA: Diagnosis not present

## 2020-12-11 DIAGNOSIS — N2581 Secondary hyperparathyroidism of renal origin: Secondary | ICD-10-CM | POA: Diagnosis not present

## 2020-12-11 DIAGNOSIS — Z992 Dependence on renal dialysis: Secondary | ICD-10-CM | POA: Diagnosis not present

## 2020-12-11 DIAGNOSIS — N186 End stage renal disease: Secondary | ICD-10-CM | POA: Diagnosis not present

## 2020-12-13 ENCOUNTER — Other Ambulatory Visit (HOSPITAL_COMMUNITY): Payer: Self-pay | Admitting: Medical

## 2020-12-13 ENCOUNTER — Other Ambulatory Visit (HOSPITAL_COMMUNITY): Payer: Self-pay | Admitting: Physician Assistant

## 2020-12-13 DIAGNOSIS — N2581 Secondary hyperparathyroidism of renal origin: Secondary | ICD-10-CM | POA: Diagnosis not present

## 2020-12-13 DIAGNOSIS — E8779 Other fluid overload: Secondary | ICD-10-CM | POA: Diagnosis not present

## 2020-12-13 DIAGNOSIS — E8770 Fluid overload, unspecified: Secondary | ICD-10-CM | POA: Diagnosis not present

## 2020-12-13 DIAGNOSIS — Z992 Dependence on renal dialysis: Secondary | ICD-10-CM | POA: Diagnosis not present

## 2020-12-13 DIAGNOSIS — N186 End stage renal disease: Secondary | ICD-10-CM | POA: Diagnosis not present

## 2020-12-13 DIAGNOSIS — J9691 Respiratory failure, unspecified with hypoxia: Secondary | ICD-10-CM | POA: Diagnosis not present

## 2020-12-14 ENCOUNTER — Encounter (HOSPITAL_COMMUNITY): Payer: Self-pay | Admitting: Interventional Radiology

## 2020-12-14 ENCOUNTER — Ambulatory Visit (HOSPITAL_COMMUNITY)
Admission: RE | Admit: 2020-12-14 | Discharge: 2020-12-14 | Disposition: A | Discharge status: Home / Self Care | Payer: Medicare Other | Source: Ambulatory Visit | Attending: Medical | Admitting: Medical

## 2020-12-14 ENCOUNTER — Other Ambulatory Visit: Payer: Self-pay

## 2020-12-14 DIAGNOSIS — N186 End stage renal disease: Secondary | ICD-10-CM | POA: Insufficient documentation

## 2020-12-14 DIAGNOSIS — Y841 Kidney dialysis as the cause of abnormal reaction of the patient, or of later complication, without mention of misadventure at the time of the procedure: Secondary | ICD-10-CM | POA: Diagnosis not present

## 2020-12-14 DIAGNOSIS — T8242XA Displacement of vascular dialysis catheter, initial encounter: Secondary | ICD-10-CM | POA: Diagnosis not present

## 2020-12-14 DIAGNOSIS — T829XXA Unspecified complication of cardiac and vascular prosthetic device, implant and graft, initial encounter: Secondary | ICD-10-CM | POA: Insufficient documentation

## 2020-12-14 HISTORY — PX: IR FLUORO GUIDE CV LINE LEFT: IMG2282

## 2020-12-14 LAB — GLUCOSE, CAPILLARY: Glucose-Capillary: 213 mg/dL — ABNORMAL HIGH (ref 70–99)

## 2020-12-14 MED ORDER — SODIUM CHLORIDE 0.9 % IV SOLN: INTRAVENOUS | Status: DC

## 2020-12-14 MED ORDER — VANCOMYCIN HCL 1500 MG/300ML IV SOLN
1500.0000 mg | INTRAVENOUS | Status: DC
  Filled 2020-12-14 (×2): qty 300

## 2020-12-14 MED ORDER — DIPHENHYDRAMINE HCL 25 MG PO CAPS: 25.0000 mg | ORAL_CAPSULE | Freq: Once | ORAL | Status: DC

## 2020-12-14 MED ORDER — DIPHENHYDRAMINE HCL 25 MG PO CAPS
25.0000 mg | ORAL_CAPSULE | ORAL | Status: AC
  Administered 2020-12-14: 25 mg via ORAL
  Filled 2020-12-14: qty 1

## 2020-12-14 MED ORDER — VANCOMYCIN HCL 1500 MG/300ML IV SOLN
1500.0000 mg | INTRAVENOUS | Status: AC
  Administered 2020-12-14: 1500 mg via INTRAVENOUS
  Filled 2020-12-14: qty 300

## 2020-12-14 MED ORDER — CHLORHEXIDINE GLUCONATE 4 % EX LIQD
CUTANEOUS | Status: AC
  Filled 2020-12-14: qty 15

## 2020-12-14 MED ORDER — HEPARIN SODIUM (PORCINE) 1000 UNIT/ML IJ SOLN
INTRAMUSCULAR | Status: AC
  Filled 2020-12-14: qty 1

## 2020-12-14 MED ORDER — LIDOCAINE HCL (PF) 1 % IJ SOLN
INTRAMUSCULAR | Status: DC | PRN
  Administered 2020-12-14: 10 mL

## 2020-12-14 MED ORDER — LIDOCAINE HCL 1 % IJ SOLN
INTRAMUSCULAR | Status: AC
  Filled 2020-12-14: qty 20

## 2020-12-14 NOTE — Procedures (Signed)
Interventional Radiology Procedure Note  Procedure: Exchange for a new left IJ 28 cm Palindrome HD catheter.  Tips positioned into the RA and catheter working very well.   Complications: None  Estimated Blood Loss: None  Recommendations: - Routine line care   Signed,  Criselda Peaches, MD

## 2020-12-14 NOTE — Progress Notes (Signed)
Pt tolerated procedure well. D/C home by wheelchair with transport. awake and alert. In no distress.

## 2020-12-15 DIAGNOSIS — N2581 Secondary hyperparathyroidism of renal origin: Secondary | ICD-10-CM | POA: Diagnosis not present

## 2020-12-15 DIAGNOSIS — E8779 Other fluid overload: Secondary | ICD-10-CM | POA: Diagnosis not present

## 2020-12-15 DIAGNOSIS — N186 End stage renal disease: Secondary | ICD-10-CM | POA: Diagnosis not present

## 2020-12-15 DIAGNOSIS — Z992 Dependence on renal dialysis: Secondary | ICD-10-CM | POA: Diagnosis not present

## 2020-12-15 DIAGNOSIS — E8770 Fluid overload, unspecified: Secondary | ICD-10-CM | POA: Diagnosis not present

## 2020-12-18 DIAGNOSIS — E8779 Other fluid overload: Secondary | ICD-10-CM | POA: Diagnosis not present

## 2020-12-18 DIAGNOSIS — Z992 Dependence on renal dialysis: Secondary | ICD-10-CM | POA: Diagnosis not present

## 2020-12-18 DIAGNOSIS — E8770 Fluid overload, unspecified: Secondary | ICD-10-CM | POA: Diagnosis not present

## 2020-12-18 DIAGNOSIS — N2581 Secondary hyperparathyroidism of renal origin: Secondary | ICD-10-CM | POA: Diagnosis not present

## 2020-12-18 DIAGNOSIS — N186 End stage renal disease: Secondary | ICD-10-CM | POA: Diagnosis not present

## 2020-12-20 DIAGNOSIS — N2581 Secondary hyperparathyroidism of renal origin: Secondary | ICD-10-CM | POA: Diagnosis not present

## 2020-12-20 DIAGNOSIS — E8770 Fluid overload, unspecified: Secondary | ICD-10-CM | POA: Diagnosis not present

## 2020-12-20 DIAGNOSIS — N186 End stage renal disease: Secondary | ICD-10-CM | POA: Diagnosis not present

## 2020-12-20 DIAGNOSIS — Z992 Dependence on renal dialysis: Secondary | ICD-10-CM | POA: Diagnosis not present

## 2020-12-20 DIAGNOSIS — E8779 Other fluid overload: Secondary | ICD-10-CM | POA: Diagnosis not present

## 2020-12-22 DIAGNOSIS — E8770 Fluid overload, unspecified: Secondary | ICD-10-CM | POA: Diagnosis not present

## 2020-12-22 DIAGNOSIS — E8779 Other fluid overload: Secondary | ICD-10-CM | POA: Diagnosis not present

## 2020-12-22 DIAGNOSIS — Z992 Dependence on renal dialysis: Secondary | ICD-10-CM | POA: Diagnosis not present

## 2020-12-22 DIAGNOSIS — N2581 Secondary hyperparathyroidism of renal origin: Secondary | ICD-10-CM | POA: Diagnosis not present

## 2020-12-22 DIAGNOSIS — N186 End stage renal disease: Secondary | ICD-10-CM | POA: Diagnosis not present

## 2020-12-23 DIAGNOSIS — E109 Type 1 diabetes mellitus without complications: Secondary | ICD-10-CM | POA: Diagnosis not present

## 2020-12-23 DIAGNOSIS — Z0100 Encounter for examination of eyes and vision without abnormal findings: Secondary | ICD-10-CM | POA: Diagnosis not present

## 2020-12-25 DIAGNOSIS — N2581 Secondary hyperparathyroidism of renal origin: Secondary | ICD-10-CM | POA: Diagnosis not present

## 2020-12-25 DIAGNOSIS — E8779 Other fluid overload: Secondary | ICD-10-CM | POA: Diagnosis not present

## 2020-12-25 DIAGNOSIS — N186 End stage renal disease: Secondary | ICD-10-CM | POA: Diagnosis not present

## 2020-12-25 DIAGNOSIS — E8770 Fluid overload, unspecified: Secondary | ICD-10-CM | POA: Diagnosis not present

## 2020-12-25 DIAGNOSIS — Z992 Dependence on renal dialysis: Secondary | ICD-10-CM | POA: Diagnosis not present

## 2020-12-27 DIAGNOSIS — E8779 Other fluid overload: Secondary | ICD-10-CM | POA: Diagnosis not present

## 2020-12-27 DIAGNOSIS — N2581 Secondary hyperparathyroidism of renal origin: Secondary | ICD-10-CM | POA: Diagnosis not present

## 2020-12-27 DIAGNOSIS — E8770 Fluid overload, unspecified: Secondary | ICD-10-CM | POA: Diagnosis not present

## 2020-12-27 DIAGNOSIS — N186 End stage renal disease: Secondary | ICD-10-CM | POA: Diagnosis not present

## 2020-12-27 DIAGNOSIS — Z992 Dependence on renal dialysis: Secondary | ICD-10-CM | POA: Diagnosis not present

## 2020-12-29 DIAGNOSIS — Z992 Dependence on renal dialysis: Secondary | ICD-10-CM | POA: Diagnosis not present

## 2020-12-29 DIAGNOSIS — N2581 Secondary hyperparathyroidism of renal origin: Secondary | ICD-10-CM | POA: Diagnosis not present

## 2020-12-29 DIAGNOSIS — N186 End stage renal disease: Secondary | ICD-10-CM | POA: Diagnosis not present

## 2020-12-29 DIAGNOSIS — E8770 Fluid overload, unspecified: Secondary | ICD-10-CM | POA: Diagnosis not present

## 2020-12-29 DIAGNOSIS — E8779 Other fluid overload: Secondary | ICD-10-CM | POA: Diagnosis not present

## 2020-12-31 DIAGNOSIS — N186 End stage renal disease: Secondary | ICD-10-CM | POA: Diagnosis not present

## 2020-12-31 DIAGNOSIS — Z992 Dependence on renal dialysis: Secondary | ICD-10-CM | POA: Diagnosis not present

## 2020-12-31 DIAGNOSIS — E8770 Fluid overload, unspecified: Secondary | ICD-10-CM | POA: Diagnosis not present

## 2020-12-31 DIAGNOSIS — E8779 Other fluid overload: Secondary | ICD-10-CM | POA: Diagnosis not present

## 2020-12-31 DIAGNOSIS — N2581 Secondary hyperparathyroidism of renal origin: Secondary | ICD-10-CM | POA: Diagnosis not present

## 2021-01-01 DIAGNOSIS — D509 Iron deficiency anemia, unspecified: Secondary | ICD-10-CM | POA: Diagnosis not present

## 2021-01-01 DIAGNOSIS — Z992 Dependence on renal dialysis: Secondary | ICD-10-CM | POA: Diagnosis not present

## 2021-01-01 DIAGNOSIS — N186 End stage renal disease: Secondary | ICD-10-CM | POA: Diagnosis not present

## 2021-01-01 DIAGNOSIS — N2581 Secondary hyperparathyroidism of renal origin: Secondary | ICD-10-CM | POA: Diagnosis not present

## 2021-01-01 DIAGNOSIS — E8770 Fluid overload, unspecified: Secondary | ICD-10-CM | POA: Diagnosis not present

## 2021-01-01 DIAGNOSIS — E8779 Other fluid overload: Secondary | ICD-10-CM | POA: Diagnosis not present

## 2021-01-02 ENCOUNTER — Ambulatory Visit: Payer: Medicaid Other | Admitting: Vascular Surgery

## 2021-01-03 DIAGNOSIS — N2581 Secondary hyperparathyroidism of renal origin: Secondary | ICD-10-CM | POA: Diagnosis not present

## 2021-01-03 DIAGNOSIS — Z992 Dependence on renal dialysis: Secondary | ICD-10-CM | POA: Diagnosis not present

## 2021-01-03 DIAGNOSIS — N186 End stage renal disease: Secondary | ICD-10-CM | POA: Diagnosis not present

## 2021-01-03 DIAGNOSIS — E8770 Fluid overload, unspecified: Secondary | ICD-10-CM | POA: Diagnosis not present

## 2021-01-03 DIAGNOSIS — E8779 Other fluid overload: Secondary | ICD-10-CM | POA: Diagnosis not present

## 2021-01-04 DIAGNOSIS — R5383 Other fatigue: Secondary | ICD-10-CM | POA: Diagnosis not present

## 2021-01-04 DIAGNOSIS — Z992 Dependence on renal dialysis: Secondary | ICD-10-CM | POA: Diagnosis not present

## 2021-01-04 DIAGNOSIS — E785 Hyperlipidemia, unspecified: Secondary | ICD-10-CM | POA: Diagnosis not present

## 2021-01-04 DIAGNOSIS — N186 End stage renal disease: Secondary | ICD-10-CM | POA: Diagnosis not present

## 2021-01-05 DIAGNOSIS — E8779 Other fluid overload: Secondary | ICD-10-CM | POA: Diagnosis not present

## 2021-01-05 DIAGNOSIS — Z992 Dependence on renal dialysis: Secondary | ICD-10-CM | POA: Diagnosis not present

## 2021-01-05 DIAGNOSIS — N186 End stage renal disease: Secondary | ICD-10-CM | POA: Diagnosis not present

## 2021-01-08 DIAGNOSIS — E119 Type 2 diabetes mellitus without complications: Secondary | ICD-10-CM | POA: Diagnosis not present

## 2021-01-08 DIAGNOSIS — Z992 Dependence on renal dialysis: Secondary | ICD-10-CM | POA: Diagnosis not present

## 2021-01-08 DIAGNOSIS — E8779 Other fluid overload: Secondary | ICD-10-CM | POA: Diagnosis not present

## 2021-01-08 DIAGNOSIS — D509 Iron deficiency anemia, unspecified: Secondary | ICD-10-CM | POA: Diagnosis not present

## 2021-01-08 DIAGNOSIS — N2581 Secondary hyperparathyroidism of renal origin: Secondary | ICD-10-CM | POA: Diagnosis not present

## 2021-01-08 DIAGNOSIS — N186 End stage renal disease: Secondary | ICD-10-CM | POA: Diagnosis not present

## 2021-01-10 DIAGNOSIS — N186 End stage renal disease: Secondary | ICD-10-CM | POA: Diagnosis not present

## 2021-01-10 DIAGNOSIS — Z992 Dependence on renal dialysis: Secondary | ICD-10-CM | POA: Diagnosis not present

## 2021-01-10 DIAGNOSIS — E8779 Other fluid overload: Secondary | ICD-10-CM | POA: Diagnosis not present

## 2021-01-12 DIAGNOSIS — J9691 Respiratory failure, unspecified with hypoxia: Secondary | ICD-10-CM | POA: Diagnosis not present

## 2021-01-12 DIAGNOSIS — E8779 Other fluid overload: Secondary | ICD-10-CM | POA: Diagnosis not present

## 2021-01-12 DIAGNOSIS — Z992 Dependence on renal dialysis: Secondary | ICD-10-CM | POA: Diagnosis not present

## 2021-01-12 DIAGNOSIS — N186 End stage renal disease: Secondary | ICD-10-CM | POA: Diagnosis not present

## 2021-01-15 DIAGNOSIS — N186 End stage renal disease: Secondary | ICD-10-CM | POA: Diagnosis not present

## 2021-01-15 DIAGNOSIS — Z992 Dependence on renal dialysis: Secondary | ICD-10-CM | POA: Diagnosis not present

## 2021-01-15 DIAGNOSIS — E8779 Other fluid overload: Secondary | ICD-10-CM | POA: Diagnosis not present

## 2021-01-17 DIAGNOSIS — Z992 Dependence on renal dialysis: Secondary | ICD-10-CM | POA: Diagnosis not present

## 2021-01-17 DIAGNOSIS — N186 End stage renal disease: Secondary | ICD-10-CM | POA: Diagnosis not present

## 2021-01-17 DIAGNOSIS — E8779 Other fluid overload: Secondary | ICD-10-CM | POA: Diagnosis not present

## 2021-01-18 DIAGNOSIS — E8779 Other fluid overload: Secondary | ICD-10-CM | POA: Diagnosis not present

## 2021-01-18 DIAGNOSIS — N186 End stage renal disease: Secondary | ICD-10-CM | POA: Diagnosis not present

## 2021-01-18 DIAGNOSIS — Z992 Dependence on renal dialysis: Secondary | ICD-10-CM | POA: Diagnosis not present

## 2021-01-21 DIAGNOSIS — E8779 Other fluid overload: Secondary | ICD-10-CM | POA: Diagnosis not present

## 2021-01-21 DIAGNOSIS — N186 End stage renal disease: Secondary | ICD-10-CM | POA: Diagnosis not present

## 2021-01-21 DIAGNOSIS — Z992 Dependence on renal dialysis: Secondary | ICD-10-CM | POA: Diagnosis not present

## 2021-01-22 DIAGNOSIS — Z992 Dependence on renal dialysis: Secondary | ICD-10-CM | POA: Diagnosis not present

## 2021-01-22 DIAGNOSIS — E8779 Other fluid overload: Secondary | ICD-10-CM | POA: Diagnosis not present

## 2021-01-22 DIAGNOSIS — N186 End stage renal disease: Secondary | ICD-10-CM | POA: Diagnosis not present

## 2021-01-22 DIAGNOSIS — N2581 Secondary hyperparathyroidism of renal origin: Secondary | ICD-10-CM | POA: Diagnosis not present

## 2021-01-23 ENCOUNTER — Ambulatory Visit: Payer: Medicaid Other | Admitting: Vascular Surgery

## 2021-01-24 DIAGNOSIS — Z992 Dependence on renal dialysis: Secondary | ICD-10-CM | POA: Diagnosis not present

## 2021-01-24 DIAGNOSIS — E8779 Other fluid overload: Secondary | ICD-10-CM | POA: Diagnosis not present

## 2021-01-24 DIAGNOSIS — N186 End stage renal disease: Secondary | ICD-10-CM | POA: Diagnosis not present

## 2021-01-25 DIAGNOSIS — Z299 Encounter for prophylactic measures, unspecified: Secondary | ICD-10-CM | POA: Diagnosis not present

## 2021-01-25 DIAGNOSIS — E1165 Type 2 diabetes mellitus with hyperglycemia: Secondary | ICD-10-CM | POA: Diagnosis not present

## 2021-01-25 DIAGNOSIS — J9691 Respiratory failure, unspecified with hypoxia: Secondary | ICD-10-CM | POA: Diagnosis not present

## 2021-01-25 DIAGNOSIS — Z713 Dietary counseling and surveillance: Secondary | ICD-10-CM | POA: Diagnosis not present

## 2021-01-26 DIAGNOSIS — N186 End stage renal disease: Secondary | ICD-10-CM | POA: Diagnosis not present

## 2021-01-26 DIAGNOSIS — E8779 Other fluid overload: Secondary | ICD-10-CM | POA: Diagnosis not present

## 2021-01-26 DIAGNOSIS — Z992 Dependence on renal dialysis: Secondary | ICD-10-CM | POA: Diagnosis not present

## 2021-01-29 DIAGNOSIS — N186 End stage renal disease: Secondary | ICD-10-CM | POA: Diagnosis not present

## 2021-01-29 DIAGNOSIS — Z992 Dependence on renal dialysis: Secondary | ICD-10-CM | POA: Diagnosis not present

## 2021-01-29 DIAGNOSIS — E8779 Other fluid overload: Secondary | ICD-10-CM | POA: Diagnosis not present

## 2021-01-30 ENCOUNTER — Ambulatory Visit: Payer: Medicaid Other | Admitting: Vascular Surgery

## 2021-01-31 DIAGNOSIS — N186 End stage renal disease: Secondary | ICD-10-CM | POA: Diagnosis not present

## 2021-01-31 DIAGNOSIS — E8779 Other fluid overload: Secondary | ICD-10-CM | POA: Diagnosis not present

## 2021-01-31 DIAGNOSIS — Z992 Dependence on renal dialysis: Secondary | ICD-10-CM | POA: Diagnosis not present

## 2021-02-02 DIAGNOSIS — Z992 Dependence on renal dialysis: Secondary | ICD-10-CM | POA: Diagnosis not present

## 2021-02-02 DIAGNOSIS — N186 End stage renal disease: Secondary | ICD-10-CM | POA: Diagnosis not present

## 2021-02-02 DIAGNOSIS — E8779 Other fluid overload: Secondary | ICD-10-CM | POA: Diagnosis not present

## 2021-02-04 DIAGNOSIS — Z992 Dependence on renal dialysis: Secondary | ICD-10-CM | POA: Diagnosis not present

## 2021-02-04 DIAGNOSIS — E785 Hyperlipidemia, unspecified: Secondary | ICD-10-CM | POA: Diagnosis not present

## 2021-02-04 DIAGNOSIS — N186 End stage renal disease: Secondary | ICD-10-CM | POA: Diagnosis not present

## 2021-02-04 DIAGNOSIS — R5383 Other fatigue: Secondary | ICD-10-CM | POA: Diagnosis not present

## 2021-02-05 DIAGNOSIS — I509 Heart failure, unspecified: Secondary | ICD-10-CM | POA: Diagnosis not present

## 2021-02-05 DIAGNOSIS — E8779 Other fluid overload: Secondary | ICD-10-CM | POA: Diagnosis not present

## 2021-02-05 DIAGNOSIS — N2581 Secondary hyperparathyroidism of renal origin: Secondary | ICD-10-CM | POA: Diagnosis not present

## 2021-02-05 DIAGNOSIS — Z992 Dependence on renal dialysis: Secondary | ICD-10-CM | POA: Diagnosis not present

## 2021-02-05 DIAGNOSIS — E8770 Fluid overload, unspecified: Secondary | ICD-10-CM | POA: Diagnosis not present

## 2021-02-05 DIAGNOSIS — N186 End stage renal disease: Secondary | ICD-10-CM | POA: Diagnosis not present

## 2021-02-07 DIAGNOSIS — E8779 Other fluid overload: Secondary | ICD-10-CM | POA: Diagnosis not present

## 2021-02-07 DIAGNOSIS — N186 End stage renal disease: Secondary | ICD-10-CM | POA: Diagnosis not present

## 2021-02-07 DIAGNOSIS — I509 Heart failure, unspecified: Secondary | ICD-10-CM | POA: Diagnosis not present

## 2021-02-07 DIAGNOSIS — Z992 Dependence on renal dialysis: Secondary | ICD-10-CM | POA: Diagnosis not present

## 2021-02-07 DIAGNOSIS — E8770 Fluid overload, unspecified: Secondary | ICD-10-CM | POA: Diagnosis not present

## 2021-02-08 DIAGNOSIS — Z0000 Encounter for general adult medical examination without abnormal findings: Secondary | ICD-10-CM | POA: Diagnosis not present

## 2021-02-08 DIAGNOSIS — R059 Cough, unspecified: Secondary | ICD-10-CM | POA: Diagnosis not present

## 2021-02-08 DIAGNOSIS — K0889 Other specified disorders of teeth and supporting structures: Secondary | ICD-10-CM | POA: Diagnosis not present

## 2021-02-08 DIAGNOSIS — Z299 Encounter for prophylactic measures, unspecified: Secondary | ICD-10-CM | POA: Diagnosis not present

## 2021-02-08 DIAGNOSIS — Z7189 Other specified counseling: Secondary | ICD-10-CM | POA: Diagnosis not present

## 2021-02-08 DIAGNOSIS — J329 Chronic sinusitis, unspecified: Secondary | ICD-10-CM | POA: Diagnosis not present

## 2021-02-09 DIAGNOSIS — Z992 Dependence on renal dialysis: Secondary | ICD-10-CM | POA: Diagnosis not present

## 2021-02-09 DIAGNOSIS — N186 End stage renal disease: Secondary | ICD-10-CM | POA: Diagnosis not present

## 2021-02-09 DIAGNOSIS — E8779 Other fluid overload: Secondary | ICD-10-CM | POA: Diagnosis not present

## 2021-02-09 DIAGNOSIS — I509 Heart failure, unspecified: Secondary | ICD-10-CM | POA: Diagnosis not present

## 2021-02-09 DIAGNOSIS — E8770 Fluid overload, unspecified: Secondary | ICD-10-CM | POA: Diagnosis not present

## 2021-02-12 DIAGNOSIS — J9691 Respiratory failure, unspecified with hypoxia: Secondary | ICD-10-CM | POA: Diagnosis not present

## 2021-02-12 DIAGNOSIS — E8779 Other fluid overload: Secondary | ICD-10-CM | POA: Diagnosis not present

## 2021-02-12 DIAGNOSIS — I509 Heart failure, unspecified: Secondary | ICD-10-CM | POA: Diagnosis not present

## 2021-02-12 DIAGNOSIS — E8770 Fluid overload, unspecified: Secondary | ICD-10-CM | POA: Diagnosis not present

## 2021-02-12 DIAGNOSIS — Z992 Dependence on renal dialysis: Secondary | ICD-10-CM | POA: Diagnosis not present

## 2021-02-12 DIAGNOSIS — N186 End stage renal disease: Secondary | ICD-10-CM | POA: Diagnosis not present

## 2021-02-13 DIAGNOSIS — J9691 Respiratory failure, unspecified with hypoxia: Secondary | ICD-10-CM | POA: Diagnosis not present

## 2021-02-14 DIAGNOSIS — I509 Heart failure, unspecified: Secondary | ICD-10-CM | POA: Diagnosis not present

## 2021-02-14 DIAGNOSIS — N186 End stage renal disease: Secondary | ICD-10-CM | POA: Diagnosis not present

## 2021-02-14 DIAGNOSIS — E8779 Other fluid overload: Secondary | ICD-10-CM | POA: Diagnosis not present

## 2021-02-14 DIAGNOSIS — E8770 Fluid overload, unspecified: Secondary | ICD-10-CM | POA: Diagnosis not present

## 2021-02-14 DIAGNOSIS — Z992 Dependence on renal dialysis: Secondary | ICD-10-CM | POA: Diagnosis not present

## 2021-02-16 DIAGNOSIS — E8779 Other fluid overload: Secondary | ICD-10-CM | POA: Diagnosis not present

## 2021-02-16 DIAGNOSIS — Z992 Dependence on renal dialysis: Secondary | ICD-10-CM | POA: Diagnosis not present

## 2021-02-16 DIAGNOSIS — E8770 Fluid overload, unspecified: Secondary | ICD-10-CM | POA: Diagnosis not present

## 2021-02-16 DIAGNOSIS — I509 Heart failure, unspecified: Secondary | ICD-10-CM | POA: Diagnosis not present

## 2021-02-16 DIAGNOSIS — N186 End stage renal disease: Secondary | ICD-10-CM | POA: Diagnosis not present

## 2021-02-18 ENCOUNTER — Other Ambulatory Visit (HOSPITAL_COMMUNITY): Payer: Self-pay | Admitting: Medical

## 2021-02-18 DIAGNOSIS — N186 End stage renal disease: Secondary | ICD-10-CM

## 2021-02-19 ENCOUNTER — Other Ambulatory Visit: Payer: Self-pay

## 2021-02-19 ENCOUNTER — Emergency Department (HOSPITAL_COMMUNITY)
Admission: EM | Admit: 2021-02-19 | Discharge: 2021-02-20 | Disposition: A | Discharge status: Home / Self Care | Payer: Medicare Other | Attending: Emergency Medicine | Admitting: Emergency Medicine

## 2021-02-19 ENCOUNTER — Ambulatory Visit (HOSPITAL_COMMUNITY)
Admission: RE | Admit: 2021-02-19 | Discharge: 2021-02-19 | Disposition: A | Discharge status: Home / Self Care | Payer: Medicare Other | Source: Ambulatory Visit | Attending: Medical | Admitting: Medical

## 2021-02-19 ENCOUNTER — Other Ambulatory Visit: Payer: Self-pay | Admitting: Radiology

## 2021-02-19 DIAGNOSIS — Y841 Kidney dialysis as the cause of abnormal reaction of the patient, or of later complication, without mention of misadventure at the time of the procedure: Secondary | ICD-10-CM | POA: Insufficient documentation

## 2021-02-19 DIAGNOSIS — Z20822 Contact with and (suspected) exposure to covid-19: Secondary | ICD-10-CM | POA: Insufficient documentation

## 2021-02-19 DIAGNOSIS — R0602 Shortness of breath: Secondary | ICD-10-CM | POA: Diagnosis not present

## 2021-02-19 DIAGNOSIS — E1122 Type 2 diabetes mellitus with diabetic chronic kidney disease: Secondary | ICD-10-CM | POA: Insufficient documentation

## 2021-02-19 DIAGNOSIS — Z539 Procedure and treatment not carried out, unspecified reason: Secondary | ICD-10-CM | POA: Insufficient documentation

## 2021-02-19 DIAGNOSIS — E162 Hypoglycemia, unspecified: Secondary | ICD-10-CM

## 2021-02-19 DIAGNOSIS — I120 Hypertensive chronic kidney disease with stage 5 chronic kidney disease or end stage renal disease: Secondary | ICD-10-CM | POA: Diagnosis not present

## 2021-02-19 DIAGNOSIS — Z992 Dependence on renal dialysis: Secondary | ICD-10-CM | POA: Insufficient documentation

## 2021-02-19 DIAGNOSIS — E875 Hyperkalemia: Secondary | ICD-10-CM | POA: Insufficient documentation

## 2021-02-19 DIAGNOSIS — Z452 Encounter for adjustment and management of vascular access device: Secondary | ICD-10-CM

## 2021-02-19 DIAGNOSIS — T8241XA Breakdown (mechanical) of vascular dialysis catheter, initial encounter: Secondary | ICD-10-CM | POA: Insufficient documentation

## 2021-02-19 DIAGNOSIS — E11649 Type 2 diabetes mellitus with hypoglycemia without coma: Secondary | ICD-10-CM | POA: Diagnosis not present

## 2021-02-19 DIAGNOSIS — N186 End stage renal disease: Secondary | ICD-10-CM | POA: Insufficient documentation

## 2021-02-19 DIAGNOSIS — T8249XA Other complication of vascular dialysis catheter, initial encounter: Secondary | ICD-10-CM | POA: Diagnosis not present

## 2021-02-19 LAB — POCT I-STAT, CHEM 8
BUN: >130 mg/dL — ABNORMAL HIGH (ref 6–20)
Calcium, Ion: 1.09 mmol/L — ABNORMAL LOW (ref 1.15–1.40)
Chloride: 104 mmol/L (ref 98–111)
Creatinine, Ser: >18 mg/dL — ABNORMAL HIGH (ref 0.44–1.00)
Glucose, Bld: 77 mg/dL (ref 70–99)
HCT: 34 % — ABNORMAL LOW (ref 36.0–46.0)
Hemoglobin: 11.6 g/dL — ABNORMAL LOW (ref 12.0–15.0)
Potassium: 6.9 mmol/L (ref 3.5–5.1)
Sodium: 138 mmol/L (ref 135–145)
TCO2: 27 mmol/L (ref 22–32)

## 2021-02-19 LAB — HEPATITIS B SURFACE ANTIGEN: Hepatitis B Surface Ag: NONREACTIVE

## 2021-02-19 LAB — RESP PANEL BY RT-PCR (FLU A&B, COVID) ARPGX2
Influenza A by PCR: NEGATIVE
Influenza B by PCR: NEGATIVE
SARS Coronavirus 2 by RT PCR: NEGATIVE

## 2021-02-19 LAB — BASIC METABOLIC PANEL
Anion gap: 20 — ABNORMAL HIGH (ref 5–15)
BUN: 113 mg/dL — ABNORMAL HIGH (ref 6–20)
CO2: 19 mmol/L — ABNORMAL LOW (ref 22–32)
Calcium: 8.7 mg/dL — ABNORMAL LOW (ref 8.9–10.3)
Chloride: 100 mmol/L (ref 98–111)
Creatinine, Ser: 18.72 mg/dL — ABNORMAL HIGH (ref 0.44–1.00)
GFR, Estimated: 2 mL/min — ABNORMAL LOW (ref >60)
Glucose, Bld: 76 mg/dL (ref 70–99)
Potassium: 7 mmol/L (ref 3.5–5.1)
Sodium: 139 mmol/L (ref 135–145)

## 2021-02-19 LAB — GLUCOSE, CAPILLARY
Glucose-Capillary: 123 mg/dL — ABNORMAL HIGH (ref 70–99)
Glucose-Capillary: 46 mg/dL — ABNORMAL LOW (ref 70–99)
Glucose-Capillary: 80 mg/dL (ref 70–99)

## 2021-02-19 LAB — CBG MONITORING, ED
Glucose-Capillary: 81 mg/dL (ref 70–99)
Glucose-Capillary: 136 mg/dL — ABNORMAL HIGH (ref 70–99)

## 2021-02-19 LAB — I-STAT BETA HCG BLOOD, ED (MC, WL, AP ONLY): I-stat hCG, quantitative: 5.1 m[IU]/mL — ABNORMAL HIGH (ref <5)

## 2021-02-19 LAB — HEPATITIS B SURFACE ANTIBODY,QUALITATIVE: Hep B S Ab: NONREACTIVE

## 2021-02-19 MED ORDER — LIDOCAINE HCL (PF) 1 % IJ SOLN: 5.0000 mL | INTRAMUSCULAR | Status: DC | PRN

## 2021-02-19 MED ORDER — SODIUM CHLORIDE 0.9% FLUSH
3.0000 mL | Freq: Two times a day (BID) | INTRAVENOUS | Status: DC
  Administered 2021-02-19: 3 mL via INTRAVENOUS

## 2021-02-19 MED ORDER — PENTAFLUOROPROP-TETRAFLUOROETH EX AERO: 1.0000 "application " | INHALATION_SPRAY | CUTANEOUS | Status: DC | PRN

## 2021-02-19 MED ORDER — SODIUM CHLORIDE 0.9 % IV SOLN: 250.0000 mL | INTRAVENOUS | Status: DC | PRN

## 2021-02-19 MED ORDER — HEPARIN SODIUM (PORCINE) 1000 UNIT/ML DIALYSIS
1000.0000 [IU] | INTRAMUSCULAR | Status: DC | PRN
  Filled 2021-02-19 (×2): qty 1

## 2021-02-19 MED ORDER — LIDOCAINE HCL 1 % IJ SOLN
INTRAMUSCULAR | Status: AC
  Filled 2021-02-19: qty 20

## 2021-02-19 MED ORDER — SODIUM ZIRCONIUM CYCLOSILICATE 10 G PO PACK
10.0000 g | PACK | Freq: Once | ORAL | Status: AC
  Administered 2021-02-19: 10 g via ORAL
  Filled 2021-02-19: qty 1

## 2021-02-19 MED ORDER — CARVEDILOL 12.5 MG PO TABS
25.0000 mg | ORAL_TABLET | Freq: Once | ORAL | Status: AC
  Administered 2021-02-19: 25 mg via ORAL
  Filled 2021-02-19: qty 2

## 2021-02-19 MED ORDER — AMLODIPINE BESYLATE 5 MG PO TABS
10.0000 mg | ORAL_TABLET | Freq: Once | ORAL | Status: AC
  Administered 2021-02-19: 10 mg via ORAL
  Filled 2021-02-19: qty 2

## 2021-02-19 MED ORDER — SODIUM CHLORIDE 0.9 % IV SOLN: 100.0000 mL | INTRAVENOUS | Status: DC | PRN

## 2021-02-19 MED ORDER — DEXTROSE 50 % IV SOLN: 25.0000 mL | Freq: Once | INTRAVENOUS | Status: AC

## 2021-02-19 MED ORDER — ALTEPLASE 2 MG IJ SOLR: 2.0000 mg | Freq: Once | INTRAMUSCULAR | Status: DC | PRN

## 2021-02-19 MED ORDER — SODIUM CHLORIDE 0.9 % IV SOLN: INTRAVENOUS | Status: DC

## 2021-02-19 MED ORDER — CHLORHEXIDINE GLUCONATE CLOTH 2 % EX PADS: 6.0000 | MEDICATED_PAD | Freq: Every day | CUTANEOUS | Status: DC

## 2021-02-19 MED ORDER — DEXTROSE 50 % IV SOLN
INTRAVENOUS | Status: AC
  Administered 2021-02-19: 25 mL via INTRAVENOUS
  Filled 2021-02-19: qty 50

## 2021-02-19 MED ORDER — LIDOCAINE-PRILOCAINE 2.5-2.5 % EX CREA: 1.0000 "application " | TOPICAL_CREAM | CUTANEOUS | Status: DC | PRN

## 2021-02-19 MED ORDER — SODIUM CHLORIDE 0.9% FLUSH: 3.0000 mL | INTRAVENOUS | Status: DC | PRN

## 2021-02-19 MED ORDER — HEPARIN SODIUM (PORCINE) 1000 UNIT/ML IJ SOLN
INTRAMUSCULAR | Status: AC
  Filled 2021-02-19: qty 1

## 2021-02-19 MED ORDER — LIDOCAINE-EPINEPHRINE 1 %-1:100000 IJ SOLN
INTRAMUSCULAR | Status: AC
  Filled 2021-02-19: qty 1

## 2021-02-19 NOTE — ED Provider Notes (Signed)
[facility] EMERGENCY DEPARTMENT Provider Note   CSN: [id number] Arrival date & time: 02/19/21  1716     History Chief Complaint  Patient presents with   Hypoglycemia   Abnormal Lab    Courtney Gray is a 50 y.o. female.  HPI 50 year old female presents after being sent down from radiology with a potassium of 7.0.  Patient was in IR to have HD catheter exchange as she has been having difficulty getting full dialysis treatments because of slow filtration rates.  Patient also started to feel drowsy because she had not been eating for the procedure and her glucose had up going down to 46.  She was given D50.  Patient states that from a hypoglycemia standpoint she is feeling better.  She otherwise denies acute illness such as fever, shortness of breath, chest pain.  Has been having issues with this catheter for about 2 weeks.  Past Medical History:  Diagnosis Date   Diabetes mellitus without complication ([place])    High cholesterol    Hypertension    Renal disorder     Patient Active Problem List   Diagnosis Date Noted   Bacteremia    Fever 09/01/2020   HTN (hypertension) 09/01/2020   ESRD (end stage renal disease) ([place]) 09/01/2020   Chronic respiratory failure with hypoxia ([place]) 09/01/2020   Respiratory distress    Hypertensive emergency    Respiratory failure ([place]) 08/28/2020   Acute respiratory failure with hypoxia ([place]) 11/28/2019   Fluid overload 11/28/2019   Hypertensive urgency 11/28/2019   Type 2 diabetes mellitus with other specified complication ([place]) 26/83/4196   Anemia of chronic disease 11/28/2019   Morbid obesity ([place]) 11/28/2019    Past Surgical History:  Procedure Laterality Date   AV FISTULA PLACEMENT     x2, unsuccessful.    IR FLUORO GUIDE CV LINE LEFT  12/14/2020   IR FLUORO GUIDE CV LINE RIGHT  08/28/2020   IR PERC TUN PERIT CATH WO PORT S&I /IMAG  09/06/2020   IR US GUIDE VASC ACCESS LEFT  09/06/2020     OB History     Gravida  2    Para  2   Term  2   Preterm      AB      Living  2      SAB      IAB      Ectopic      Multiple      Live Births              Family History  Problem Relation Age of Onset   Diabetes Mother    Diabetes Father     Social History   Tobacco Use   Smoking status: Never   Smokeless tobacco: Never  Vaping Use   Vaping Use: Never used  Substance Use Topics   Alcohol use: Never   Drug use: Never    Home Medications Prior to Admission medications   Medication Sig Start Date End Date Taking? Authorizing Provider  acetaminophen (TYLENOL) 500 MG tablet Take 1,000 mg by mouth every 6 (six) hours as needed for moderate pain or headache.    [provider]  amLODipine (NORVASC) 10 MG tablet Take 1 tablet (10 mg total) by mouth daily. Patient taking differently: Take 10 mg by mouth at bedtime. 11/29/20   Roxan Hockey, MD  aspirin EC 81 MG tablet Take 1 tablet (81 mg total) by mouth daily with breakfast. 11/28/20   Roxan Hockey, MD  atorvastatin (LIPITOR) 10 MG tablet Take 1 tablet (10 mg total) by mouth daily. Patient taking differently: Take 10 mg by mouth at bedtime. 11/28/20   Roxan Hockey, MD  calcium carbonate (TUMS SMOOTHIES) 750 MG chewable tablet Chew 750-1,500 mg by mouth See admin instructions. Take 1500 mg with each meal and 750 mg with each snack    [provider]  carvedilol (COREG) 25 MG tablet Take 1 tablet (25 mg total) by mouth 2 (two) times daily. 11/28/20   Roxan Hockey, MD  cinacalcet (SENSIPAR) 30 MG tablet Take 1 tablet (30 mg total) by mouth Every Tuesday,Thursday,and Saturday with dialysis. 11/29/20 02/27/21  Roxan Hockey, MD  diphenhydrAMINE (BENADRYL) 25 MG tablet Take 25 mg by mouth daily as needed for allergies or sleep.    [provider]  docusate sodium (COLACE) 100 MG capsule Take 100 mg by mouth 2 (two) times daily.    [provider]  doxercalciferol (HECTOROL) 4 MCG/2ML injection  Inject 2.5 mLs (5 mcg total) into the vein Every Tuesday,Thursday,and Saturday with dialysis. 09/01/20   Enzo Bi, MD  gabapentin (NEURONTIN) 100 MG capsule Take 100-200 mg by mouth See admin instructions. Take 100 mg twice daily on non dialysis days.  Take 100 mg at 9am before dialysis. Take 100-200 mg at bedtime. May take a 100 mg in the evening as needed for pain. Tuesdays, Thursdays,and Saturdays. 04/20/19   [provider]  glipiZIDE (GLUCOTROL) 5 MG tablet Take 5 mg by mouth daily.    [provider]  isosorbide mononitrate (IMDUR) 30 MG 24 hr tablet Take 1 tablet (30 mg total) by mouth daily. 11/28/20 11/28/21  Roxan Hockey, MD  LANTUS SOLOSTAR 100 UNIT/ML Solostar Pen Inject 13 Units into the skin at bedtime. 03/18/19   [provider]  lisinopril (ZESTRIL) 20 MG tablet Take 1 tablet (20 mg total) by mouth daily. 11/28/20   Roxan Hockey, MD  loperamide (IMODIUM A-D) 2 MG tablet Take 4 mg by mouth as needed for diarrhea or loose stools.    [provider]  nitroGLYCERIN (NITROSTAT) 0.4 MG SL tablet Place 0.4 mg under the tongue every 5 (five) minutes as needed for chest pain.    [provider]  pantoprazole (PROTONIX) 40 MG tablet Take 1 tablet (40 mg total) by mouth daily. 11/29/20   Roxan Hockey, MD  Phenol (CHLORASEPTIC MT) Use as directed 4 sprays in the mouth or throat 4 (four) times daily as needed (throat pain).    [provider]  simethicone (MYLICON) 510 MG chewable tablet Chew 125 mg by mouth 2 (two) times daily.    [provider]  sodium chloride (OCEAN) 0.65 % SOLN nasal spray Place 1 spray into both nostrils 2 (two) times daily as needed for congestion.    [provider]  tiZANidine (ZANAFLEX) 4 MG tablet Take 4 mg by mouth every 8 (eight) hours.  04/20/19   [provider]    Allergies    Benzonatate, Hydralazine, Amoxicillin, Clonidine, Morphine, and Oxycodone  Review of Systems    Review of Systems  Constitutional:  Negative for fever.  Respiratory:  Negative for cough and shortness of breath.   Cardiovascular:  Negative for chest pain.  Gastrointestinal:  Negative for abdominal pain and vomiting.  All other systems reviewed and are negative.  Physical Exam Updated Vital Signs BP (!) 170/71   Pulse 60   Temp 97.8 F (36.6 C) (Oral)   Resp 15   Ht 5\' 5"  (1.651 m)  Wt (!) 168.9 kg   SpO2 100%   BMI 61.96 kg/m   Physical Exam Vitals and nursing note reviewed.  Constitutional:      Appearance: She is well-developed. She is obese.  HENT:     Head: Normocephalic and atraumatic.     Right Ear: External ear normal.     Left Ear: External ear normal.     Nose: Nose normal.  Eyes:     General:        Right eye: No discharge.        Left eye: No discharge.  Cardiovascular:     Rate and Rhythm: Normal rate and regular rhythm.     Heart sounds: Normal heart sounds.  Pulmonary:     Effort: Pulmonary effort is normal.     Breath sounds: Normal breath sounds.  Abdominal:     General: There is no distension.  Skin:    General: Skin is warm and dry.  Neurological:     Mental Status: She is alert.  Psychiatric:        Mood and Affect: Mood is not anxious.    ED Results / Procedures / Treatments   Labs (all labs ordered are listed, but only abnormal results are displayed) Labs Reviewed  CBG MONITORING, ED - Abnormal; Notable for the following components:      Result Value   Glucose-Capillary 136 (*)    All other components within normal limits  I-STAT BETA HCG BLOOD, ED (MC, [facility], [facility] ONLY) - Abnormal; Notable for the following components:   I-stat hCG, quantitative 5.1 (*)    All other components within normal limits  RESP PANEL BY RT-PCR (FLU A&B, COVID) ARPGX2  HEPATITIS B SURFACE ANTIGEN  HEPATITIS B SURFACE ANTIBODY,QUALITATIVE  HEPATITIS B SURFACE ANTIBODY, QUANTITATIVE  CBG MONITORING, ED  CBG MONITORING, ED  CBG MONITORING, ED     EKG ED ECG REPORT   Date: 02/19/2021  Rate: 62  Rhythm: normal sinus rhythm  QRS Axis: normal  Intervals: normal  ST/T Wave abnormalities: normal  Conduction Disutrbances:none  Narrative Interpretation:   Old EKG Reviewed: unchanged  I have personally reviewed the EKG tracing and agree with the computerized printout as noted.    Radiology No results found.  Procedures Procedures   Medications Ordered in ED Medications  sodium chloride flush (NS) 0.9 % injection 3 mL (3 mLs Intravenous Given 02/19/21 2131)  sodium chloride flush (NS) 0.9 % injection 3 mL (has no administration in time range)  0.9 %  sodium chloride infusion (has no administration in time range)  Chlorhexidine Gluconate Cloth 2 % PADS 6 each (has no administration in time range)  pentafluoroprop-tetrafluoroeth (GEBAUERS) aerosol 1 application (has no administration in time range)  lidocaine (PF) (XYLOCAINE) 1 % injection 5 mL (has no administration in time range)  lidocaine-prilocaine (EMLA) cream 1 application (has no administration in time range)  0.9 %  sodium chloride infusion (has no administration in time range)  0.9 %  sodium chloride infusion (has no administration in time range)  heparin injection 1,000 Units (has no administration in time range)  alteplase (CATHFLO ACTIVASE) injection 2 mg (has no administration in time range)  sodium zirconium cyclosilicate (LOKELMA) packet 10 g (10 g Oral Given 02/19/21 2005)  amLODipine (NORVASC) tablet 10 mg (10 mg Oral Given 02/19/21 2042)  carvedilol (COREG) tablet 25 mg (25 mg Oral Given 02/19/21 2042)    ED Course  I have reviewed the triage vital signs and the nursing notes.  Pertinent labs & imaging results that were available during my care of the patient were reviewed by me and considered in my medical decision making (see chart for details).  Clinical Course as of 02/19/21 2313  Tue Feb 19, 2021  1827 I discussed with nephrology, Dr Joylene Grapes.  Asks for lokelma, he will dialyze, then d/c and she can re-arrange outpatient replacement of dialysis catheter. Given ECG appears stable, asks for now insulin/glucose or driving in of potassium to make dialysis easier [SG]    Clinical Course User Index [SG] Sherwood Gambler, MD   MDM Rules/Calculators/A&P                           Patient has remained stable in the ED save her hypertension.  ECG without obvious acute changes to reflect the hyperkalemia.  She is now going to dialysis and will likely be discharged when she is done. Final Clinical Impression(s) / ED Diagnoses Final diagnoses:  Hyperkalemia  Hypoglycemia    Rx / DC Orders ED Discharge Orders     None        Sherwood Gambler, MD 02/19/21 2313

## 2021-02-19 NOTE — ED Notes (Signed)
Pt offered a sandwich and gingerale at this time

## 2021-02-19 NOTE — Progress Notes (Signed)
Patient of Javier Docker but well known to our service.  Came in for catheter exchange with IR but K was 7 so sent to ER. Usual schedule is TTS. Catheter is functional but flows have been low.  Give lokelma 10g now. COVID test and hepatitis B serologies. Will perform 3hr HD session then suggest discharge and reschedule catheter exchange.

## 2021-02-19 NOTE — Progress Notes (Signed)
Notified via lab patient critical potassium of 7.0 from [facility].  Patient taken to the ED to be treated for critical value.

## 2021-02-19 NOTE — ED Notes (Signed)
Report given to Dialysis RN at this time. Waiting for transport to take pt to unit.

## 2021-02-19 NOTE — Progress Notes (Signed)
Patient presented to IR today for HD catheter exchange. She has not had a complete dialysis treatment in over two weeks due to slow flow through her catheter. I-STAT chem 8 showed a potassium of 6.9. The IR team drew a BMET and sent this to the lab - results still pending. The patient later complained of feeling weak  - CBG was 46. She received D50.   IR recommending patient  go to the ED for further evaluation/admission, nephrology consult and dialysis. We are hoping she can receive enough HD to get her potassium below 6 and IR can safely sedate/exchange HD catheter. Ramiro Harvest, [facility] who referred patient to [place] today was made aware.   Soyla Dryer, [place] [phone] 02/19/2021, 5:05 PM

## 2021-02-19 NOTE — Progress Notes (Signed)
Pt very drowsy but still arousable and able to hold a conversation. Current CBG = 46. Dr. Serafina Royals notified. Per MD, give D50 at this time.

## 2021-02-19 NOTE — ED Notes (Signed)
HD called and pt will be able to go in approx. 1 hour. Pt resting comfortable on stretcher at this time. No acute changes noted. Will continue to monitor.

## 2021-02-19 NOTE — ED Triage Notes (Signed)
Pt arrives from IR for hypoglycemia and hyperkalemia. Pt was in IR for procedure to fix her dialysis catheter which has not been flowing appropriately for 2 weeks. CBG 46, 1 amp D50 given CBG up to 86. States she is a diabetic and has not eaten since yesterday. Istat revealed K 6.6. [place] IV started and K sent off to the lab. Tuesday, Thursday, Saturday dialysis. Last tx Sat. Pt states she hasn't had a good tx in 2 week. Pt wears 3 L [place] at baseline.

## 2021-02-20 ENCOUNTER — Emergency Department (HOSPITAL_COMMUNITY): Payer: Medicare Other

## 2021-02-20 DIAGNOSIS — E875 Hyperkalemia: Secondary | ICD-10-CM | POA: Diagnosis not present

## 2021-02-20 DIAGNOSIS — E11649 Type 2 diabetes mellitus with hypoglycemia without coma: Secondary | ICD-10-CM | POA: Diagnosis not present

## 2021-02-20 DIAGNOSIS — I10 Essential (primary) hypertension: Secondary | ICD-10-CM | POA: Diagnosis not present

## 2021-02-20 DIAGNOSIS — R0602 Shortness of breath: Secondary | ICD-10-CM | POA: Diagnosis not present

## 2021-02-20 DIAGNOSIS — T8249XA Other complication of vascular dialysis catheter, initial encounter: Secondary | ICD-10-CM | POA: Diagnosis not present

## 2021-02-20 HISTORY — PX: IR FLUORO GUIDE CV LINE LEFT: IMG2282

## 2021-02-20 HISTORY — PX: IR PTA VENOUS EXCEPT DIALYSIS CIRCUIT: IMG6126

## 2021-02-20 HISTORY — PX: IR VENOCAVAGRAM SVC: IMG679

## 2021-02-20 MED ORDER — LIDOCAINE HCL 1 % IJ SOLN
INTRAMUSCULAR | Status: AC
  Filled 2021-02-20: qty 20

## 2021-02-20 MED ORDER — LIDOCAINE HCL (PF) 1 % IJ SOLN
INTRAMUSCULAR | Status: DC | PRN
  Administered 2021-02-20: 10 mL

## 2021-02-20 MED ORDER — HEPARIN SODIUM (PORCINE) 1000 UNIT/ML IJ SOLN
INTRAMUSCULAR | Status: AC
  Filled 2021-02-20: qty 1

## 2021-02-20 MED ORDER — CHLORHEXIDINE GLUCONATE 4 % EX LIQD
CUTANEOUS | Status: AC
  Filled 2021-02-20: qty 15

## 2021-02-20 MED ORDER — IOHEXOL 300 MG/ML  SOLN
100.0000 mL | Freq: Once | INTRAMUSCULAR | Status: AC | PRN
  Administered 2021-02-20: 50 mL via INTRAVENOUS

## 2021-02-20 NOTE — Progress Notes (Signed)
Inpatient Diabetes Program Recommendations  AACE/ADA: New Consensus Statement on Inpatient Glycemic Control   Target Ranges:  Prepandial:   less than 140 mg/dL      Peak postprandial:   less than 180 mg/dL (1-2 hours)      Critically ill patients:  140 - 180 mg/dL  Results for KAETLYN, NOA (MRN [id number]) as of 02/20/2021 14:18  Ref. Range 02/19/2021 13:16 02/19/2021 16:37 02/19/2021 17:04 02/19/2021 17:24 02/19/2021 19:04  Glucose-Capillary Latest Ref Range: 70 - 99 mg/dL 123 (H) 46 (L) 80 81 136 (H)    Review of Glycemic Control  Diabetes history: DM2 Outpatient Diabetes medications: Glipizide 5 mg daily, Lantus 13 units QHS Current orders for Inpatient glycemic control: None; in ER  Inpatient Diabetes Program Recommendations:    Insulin: No CBG checked since 02/19/21 at 19:04. Please consider ordering CBGs AC&HS and Novolog 0-9 units TID with meals.  NOTE: Sent secure message to Dr. Kathrynn Humble and [facility], [place].  Thanks, Barnie Alderman, RN, MSN, CDE Diabetes Coordinator Inpatient Diabetes Program [phone] (Team Pager from 8am to 5pm)

## 2021-02-20 NOTE — ED Notes (Signed)
Patient went to dialysis.

## 2021-02-20 NOTE — ED Notes (Signed)
Refused discharge vitals.

## 2021-02-20 NOTE — ED Notes (Signed)
Pt off unit.. unable to assess vitals

## 2021-02-20 NOTE — ED Notes (Signed)
Patient noted to be up for discharge. Patient expressing concerns that the problem she checked in for was not fixed. She was under the understanding that IR MD was to re-eval and possibly fix the dialysis cath. Patient voicing frustrated concerns to this nurse. MD notified of patient concerns.

## 2021-02-20 NOTE — ED Notes (Signed)
Pt assisted into a recliner. Pt also asked for soap, water, basin, washcloths to clean up. New gown provided and new linen placed on stretcher. No acute changes noted. Will continue to monitor.

## 2021-02-20 NOTE — ED Notes (Signed)
MD notified of pt being back in the department. Pt updated with plan of care and being D/C home like Dr. Renette Butters discussed. Pt states she wants to stay to have her vascath fixed and not have to come back plus the difficulty with transport. MD aware. Will continue to monitor.

## 2021-02-20 NOTE — Procedures (Signed)
CVC poorly functioning for dialysis treatment. Lines reversed. Unable to aspirate from arterial line. Per clinic activase not working. Very positional. Set up changed mid-treatment due to clotting pressure from low BFR. Unable to reach prescribed BFR.

## 2021-02-20 NOTE — ED Notes (Signed)
To IR via stretcher.

## 2021-02-20 NOTE — Discharge Planning (Signed)
RNCM consulted regarding transportation needs for pt.  RNCM provided [place] [facility] as pt has no transportation from hospital.  RNCM has [place] and Release of Liability Form on file from 11/30/19.  Pt signed waiver, therefore agreeing to written terms.

## 2021-02-20 NOTE — ED Notes (Signed)
ED Provider at bedside. Pt to stay and be evaluated by the dayshift IR team for vascath replacement per MD. No acute changes noted. Pt updated with plan of care. Will continue to monitor.

## 2021-02-20 NOTE — ED Provider Notes (Signed)
[facility] Emergency Department Provider Note MRN:  [id number]  Arrival date & time: 02/20/21     Chief Complaint   Catheter problem History of Present Illness   Courtney Gray is a 50 y.o. year-old female with a history of hypertension, diabetes, ESRD presenting to the ED with chief complaint of catheter problem.  Patient was here in the emergency department yesterday and found to have hyperkalemia, underwent emergent dialysis and is back to the emergency department for evaluation.  Has been having issues with her tunneled line.  Not able to get full treatments.  She is currently without complaints but is worried about the plan to go home and try to follow-up and get a new catheter at some point and continue trying her dialysis, which has not been going well.  Review of Systems  A complete 10 system review of systems was obtained and all systems are negative except as noted in the HPI and PMH.   Patient's Health History    Past Medical History:  Diagnosis Date   Diabetes mellitus without complication ([place])    High cholesterol    Hypertension    Renal disorder     Past Surgical History:  Procedure Laterality Date   AV FISTULA PLACEMENT     x2, unsuccessful.    IR FLUORO GUIDE CV LINE LEFT  12/14/2020   IR FLUORO GUIDE CV LINE RIGHT  08/28/2020   IR PERC TUN PERIT CATH WO PORT S&I /IMAG  09/06/2020   IR US GUIDE VASC ACCESS LEFT  09/06/2020    Family History  Problem Relation Age of Onset   Diabetes Mother    Diabetes Father     Social History   Socioeconomic History   Marital status: Single    Spouse name: Not on file   Number of children: Not on file   Years of education: Not on file   Highest education level: Not on file  Occupational History   Not on file  Tobacco Use   Smoking status: Never   Smokeless tobacco: Never  Vaping Use   Vaping Use: Never used  Substance and Sexual Activity   Alcohol use: Never   Drug use: Never   Sexual  activity: Not on file  Other Topics Concern   Not on file  Social History Narrative   Not on file   Social Determinants of Health   Financial Resource Strain: Not on file  Food Insecurity: Not on file  Transportation Needs: Not on file  Physical Activity: Not on file  Stress: Not on file  Social Connections: Not on file  Intimate Partner Violence: Not on file     Physical Exam   Vitals:   02/20/21 0358 02/20/21 0430  BP: (!) (P) 165/70 (!) 157/65  Pulse: (!) (P) 56 65  Resp: (P) 20 20  Temp: (!) (P) 97.4 F (36.3 C) 98 F (36.7 C)  SpO2: (P) 100% 100%    CONSTITUTIONAL: Well-appearing, NAD NEURO:  Alert and oriented x 3, no focal deficits EYES:  eyes equal and reactive ENT/NECK:  no LAD, no JVD CARDIO: Regular rate, well-perfused, normal S1 and S2; tunneled line left chest PULM:  CTAB no wheezing or rhonchi GI/GU:  normal bowel sounds, non-distended, non-tender MSK/SPINE:  No gross deformities, no edema SKIN:  no rash, atraumatic PSYCH:  Appropriate speech and behavior  *Additional and/or pertinent findings included in MDM below  Diagnostic and Interventional Summary    EKG Interpretation  Date/Time:  Ventricular Rate:    PR Interval:    QRS Duration:   QT Interval:    QTC Calculation:   R Axis:     Text Interpretation:         Labs Reviewed  CBG MONITORING, ED - Abnormal; Notable for the following components:      Result Value   Glucose-Capillary 136 (*)    All other components within normal limits  I-STAT BETA HCG BLOOD, ED ([facility], [facility], [facility] ONLY) - Abnormal; Notable for the following components:   I-stat hCG, quantitative 5.1 (*)    All other components within normal limits  RESP PANEL BY RT-PCR (FLU A&B, COVID) ARPGX2  HEPATITIS B SURFACE ANTIGEN  HEPATITIS B SURFACE ANTIBODY,QUALITATIVE  HEPATITIS B SURFACE ANTIBODY, QUANTITATIVE  CBG MONITORING, ED  CBG MONITORING, ED  CBG MONITORING, ED    No orders to display    Medications  sodium  chloride flush (NS) 0.9 % injection 3 mL (3 mLs Intravenous Given 02/19/21 2131)  sodium chloride flush (NS) 0.9 % injection 3 mL (has no administration in time range)  0.9 %  sodium chloride infusion (has no administration in time range)  Chlorhexidine Gluconate Cloth 2 % PADS 6 each (has no administration in time range)  pentafluoroprop-tetrafluoroeth (GEBAUERS) aerosol 1 application (has no administration in time range)  lidocaine (PF) (XYLOCAINE) 1 % injection 5 mL (has no administration in time range)  lidocaine-prilocaine (EMLA) cream 1 application (has no administration in time range)  0.9 %  sodium chloride infusion (has no administration in time range)  0.9 %  sodium chloride infusion (has no administration in time range)  heparin injection 1,000 Units (has no administration in time range)  alteplase (CATHFLO ACTIVASE) injection 2 mg (has no administration in time range)  sodium zirconium cyclosilicate (LOKELMA) packet 10 g (10 g Oral Given 02/19/21 2005)  amLODipine (NORVASC) tablet 10 mg (10 mg Oral Given 02/19/21 2042)  carvedilol (COREG) tablet 25 mg (25 mg Oral Given 02/19/21 2042)     Procedures  /  Critical Care Procedures  ED Course and Medical Decision Making  I have reviewed the triage vital signs, the nursing notes, and pertinent available records from the EMR.  Listed above are laboratory and imaging tests that I personally ordered, reviewed, and interpreted and then considered in my medical decision making (see below for details).  Patient seems to be doing well postdialysis, there is minimal documentation but it sounds like the dialysis session was again very challenging due to the tunneled line not functioning properly.  The plan was for discharge after dialysis however patient is very hesitant regarding this plan.  I reached out to Dr. Osborne Casco again to discuss any other options.  Plan is to consult the morning IR team to see if they can do a tunneled catheter line  exchange at some point today.  Patient is very happy about this potential management plan.  Seems to be the safest plan for the patient given that her inadequate dialysis sessions led to a dangerously high potassium level yesterday.  Signed out to oncoming provider at shift change.  Barth Kirks. [place]onia Small, [place] [email or web address]  Final Clinical Impressions(s) / ED Diagnoses     ICD-10-CM   1. Hyperkalemia  E87.5     2. Hypoglycemia  E16.2       ED Discharge Orders     None        Discharge Instructions Discussed with and Provided  to Patient:   Discharge Instructions   None       Maudie Flakes, MD 02/20/21 [phone]

## 2021-02-20 NOTE — Discharge Instructions (Addendum)
You have a new hemodialysis catheter placed. X-ray does not show any fluid overload.  Please get the dialysis as planned tomorrow.

## 2021-02-20 NOTE — ED Provider Notes (Addendum)
  Physical Exam  BP (!) 174/54   Pulse (!) 56   Temp 98 F (36.7 C) (Oral)   Resp 12   Ht 5\' 5"  (1.651 m)   Wt (!) (P) 165.4 kg   SpO2 100%   BMI (P) 60.68 kg/m   Physical Exam  ED Course/Procedures   Clinical Course as of 02/20/21 0824  Tue Feb 19, 2021  1827 I discussed with nephrology, Dr Joylene Grapes. Asks for lokelma, he will dialyze, then d/c and she can re-arrange outpatient replacement of dialysis catheter. Given ECG appears stable, asks for now insulin/glucose or driving in of potassium to make dialysis easier [SG]    Clinical Course User Index [SG] Sherwood Gambler, MD    .Critical Care Performed by: Varney Biles, MD Authorized by: Varney Biles, MD   Critical care provider statement:    Critical care time (minutes):  30   Critical care was time spent personally by me on the following activities:  Development of treatment plan with patient or surrogate, discussions with consultants, evaluation of patient's response to treatment, examination of patient, ordering and review of laboratory studies, ordering and review of radiographic studies, ordering and performing treatments and interventions, pulse oximetry, re-evaluation of patient's condition and review of old charts  MDM   Patient had come in with hyperkalemia.  She received dialysis, but was informed that her tunneled catheter is not working well.  She will need that to be exchanged.  Dr. Sedonia Small had seen the patient last night.  Patient is now dialyzed, I consulted interventional radiology and spoke with Dr.Mugweru, who has taken patient's information and will schedule her for tunneled cath exchange on urgent basis.  Pt made aware of the plan.    Varney Biles, MD 02/20/21 0825  10 AM: There were a lot of barriers to moving the procedure tomorrow.  Spoke with interventional radiology again and they had taken the patient for tunnel catheter placement in the afternoon.  I spoke with Dr. Marval Regal,  nephrology.  He does not think patient needs emergent hemodialysis.  We discussed that patient did not have complete dialysis as far as fluid removal yesterday.  Patient is already scheduled with hemodialysis tomorrow at her facility, that has been confirmed by [place] with [facility] in [place].  Dr. Loletha Grayer does not think that we need to do any further hemodialysis in the hospital setting.  Patient will be discharged. I will get an x-ray of her chest and i-STAT Chem-8 before she is discharged to ensure there is no clinically concenring hyper-K or volume overload.    Varney Biles, MD 02/20/21 1424  3:14 PM SW helping patient get transportation.    Varney Biles, MD 02/20/21 [phone]

## 2021-02-20 NOTE — Procedures (Signed)
Vascular and Interventional Radiology Procedure Note  Patient: Courtney Gray DOB: 1970/12/12 Medical Record Number: [id number] Note Date/Time: 02/20/21 1:11 PM   Performing Physician: Michaelle Birks, MD Assistant(s): None  Diagnosis: ESRD requiring Hemodialysis and malfunctioning HD catheter  Procedure:  CENTRAL VENOGRAM and VENOPLASTY TUNNELED HEMODIALYSIS CATHETER EXCHANGE  Anesthesia: Local Anesthetic Complications: None Estimated Blood Loss: Minimal Specimens:  None  Findings:  Malfunctioning LIJV HD catheter, unable to aspirate from arterial limb. Central venogram and venoplasty of fibrin sheath with 37mm balloon. Successful exchange for a left-sided, 27 cm (tip-to-cuff), tunneled hemodialysis catheter with the tip of the catheter in the proximal right atrium.  Plan: Catheter ready for use.  See detailed procedure note with images in PACS. The patient tolerated the procedure well without incident or complication and was returned to Floor Bed in stable condition.    Michaelle Birks, MD [facility] Radiology   Pager. [place]

## 2021-02-20 NOTE — ED Notes (Signed)
ED Provider at bedside. 

## 2021-02-21 DIAGNOSIS — N186 End stage renal disease: Secondary | ICD-10-CM | POA: Diagnosis not present

## 2021-02-21 DIAGNOSIS — Z992 Dependence on renal dialysis: Secondary | ICD-10-CM | POA: Diagnosis not present

## 2021-02-21 DIAGNOSIS — E8779 Other fluid overload: Secondary | ICD-10-CM | POA: Diagnosis not present

## 2021-02-21 DIAGNOSIS — I509 Heart failure, unspecified: Secondary | ICD-10-CM | POA: Diagnosis not present

## 2021-02-21 DIAGNOSIS — E8770 Fluid overload, unspecified: Secondary | ICD-10-CM | POA: Diagnosis not present

## 2021-02-21 LAB — HEPATITIS B SURFACE ANTIBODY, QUANTITATIVE: Hep B S AB Quant (Post): <3.1 m[IU]/mL — ABNORMAL LOW (ref >9.9)

## 2021-02-23 DIAGNOSIS — Z992 Dependence on renal dialysis: Secondary | ICD-10-CM | POA: Diagnosis not present

## 2021-02-23 DIAGNOSIS — I509 Heart failure, unspecified: Secondary | ICD-10-CM | POA: Diagnosis not present

## 2021-02-23 DIAGNOSIS — E8779 Other fluid overload: Secondary | ICD-10-CM | POA: Diagnosis not present

## 2021-02-23 DIAGNOSIS — N186 End stage renal disease: Secondary | ICD-10-CM | POA: Diagnosis not present

## 2021-02-23 DIAGNOSIS — E8770 Fluid overload, unspecified: Secondary | ICD-10-CM | POA: Diagnosis not present

## 2021-02-26 DIAGNOSIS — E8779 Other fluid overload: Secondary | ICD-10-CM | POA: Diagnosis not present

## 2021-02-26 DIAGNOSIS — Z992 Dependence on renal dialysis: Secondary | ICD-10-CM | POA: Diagnosis not present

## 2021-02-26 DIAGNOSIS — N186 End stage renal disease: Secondary | ICD-10-CM | POA: Diagnosis not present

## 2021-02-26 DIAGNOSIS — E8770 Fluid overload, unspecified: Secondary | ICD-10-CM | POA: Diagnosis not present

## 2021-02-26 DIAGNOSIS — I509 Heart failure, unspecified: Secondary | ICD-10-CM | POA: Diagnosis not present

## 2021-02-27 DIAGNOSIS — E8770 Fluid overload, unspecified: Secondary | ICD-10-CM | POA: Diagnosis not present

## 2021-02-27 DIAGNOSIS — I509 Heart failure, unspecified: Secondary | ICD-10-CM | POA: Diagnosis not present

## 2021-02-27 DIAGNOSIS — N186 End stage renal disease: Secondary | ICD-10-CM | POA: Diagnosis not present

## 2021-02-27 DIAGNOSIS — E8779 Other fluid overload: Secondary | ICD-10-CM | POA: Diagnosis not present

## 2021-02-27 DIAGNOSIS — Z7689 Persons encountering health services in other specified circumstances: Secondary | ICD-10-CM | POA: Diagnosis not present

## 2021-02-27 DIAGNOSIS — Z992 Dependence on renal dialysis: Secondary | ICD-10-CM | POA: Diagnosis not present

## 2021-03-01 DIAGNOSIS — Z992 Dependence on renal dialysis: Secondary | ICD-10-CM | POA: Diagnosis not present

## 2021-03-01 DIAGNOSIS — N186 End stage renal disease: Secondary | ICD-10-CM | POA: Diagnosis not present

## 2021-03-01 DIAGNOSIS — I509 Heart failure, unspecified: Secondary | ICD-10-CM | POA: Diagnosis not present

## 2021-03-01 DIAGNOSIS — E8770 Fluid overload, unspecified: Secondary | ICD-10-CM | POA: Diagnosis not present

## 2021-03-01 DIAGNOSIS — E8779 Other fluid overload: Secondary | ICD-10-CM | POA: Diagnosis not present

## 2021-03-02 DIAGNOSIS — Z992 Dependence on renal dialysis: Secondary | ICD-10-CM | POA: Diagnosis not present

## 2021-03-02 DIAGNOSIS — N186 End stage renal disease: Secondary | ICD-10-CM | POA: Diagnosis not present

## 2021-03-02 DIAGNOSIS — E8770 Fluid overload, unspecified: Secondary | ICD-10-CM | POA: Diagnosis not present

## 2021-03-02 DIAGNOSIS — E8779 Other fluid overload: Secondary | ICD-10-CM | POA: Diagnosis not present

## 2021-03-02 DIAGNOSIS — I509 Heart failure, unspecified: Secondary | ICD-10-CM | POA: Diagnosis not present

## 2021-03-05 DIAGNOSIS — Z992 Dependence on renal dialysis: Secondary | ICD-10-CM | POA: Diagnosis not present

## 2021-03-05 DIAGNOSIS — E8779 Other fluid overload: Secondary | ICD-10-CM | POA: Diagnosis not present

## 2021-03-05 DIAGNOSIS — I509 Heart failure, unspecified: Secondary | ICD-10-CM | POA: Diagnosis not present

## 2021-03-05 DIAGNOSIS — E8770 Fluid overload, unspecified: Secondary | ICD-10-CM | POA: Diagnosis not present

## 2021-03-05 DIAGNOSIS — N186 End stage renal disease: Secondary | ICD-10-CM | POA: Diagnosis not present

## 2021-03-06 DIAGNOSIS — Z992 Dependence on renal dialysis: Secondary | ICD-10-CM | POA: Diagnosis not present

## 2021-03-06 DIAGNOSIS — N186 End stage renal disease: Secondary | ICD-10-CM | POA: Diagnosis not present

## 2021-03-06 DIAGNOSIS — I10 Essential (primary) hypertension: Secondary | ICD-10-CM | POA: Diagnosis not present

## 2021-03-06 DIAGNOSIS — E7800 Pure hypercholesterolemia, unspecified: Secondary | ICD-10-CM | POA: Diagnosis not present

## 2021-03-07 DIAGNOSIS — Z992 Dependence on renal dialysis: Secondary | ICD-10-CM | POA: Diagnosis not present

## 2021-03-07 DIAGNOSIS — N186 End stage renal disease: Secondary | ICD-10-CM | POA: Diagnosis not present

## 2021-03-09 DIAGNOSIS — N186 End stage renal disease: Secondary | ICD-10-CM | POA: Diagnosis not present

## 2021-03-09 DIAGNOSIS — Z992 Dependence on renal dialysis: Secondary | ICD-10-CM | POA: Diagnosis not present

## 2021-03-12 DIAGNOSIS — Z992 Dependence on renal dialysis: Secondary | ICD-10-CM | POA: Diagnosis not present

## 2021-03-12 DIAGNOSIS — N186 End stage renal disease: Secondary | ICD-10-CM | POA: Diagnosis not present

## 2021-03-14 DIAGNOSIS — N186 End stage renal disease: Secondary | ICD-10-CM | POA: Diagnosis not present

## 2021-03-14 DIAGNOSIS — Z992 Dependence on renal dialysis: Secondary | ICD-10-CM | POA: Diagnosis not present

## 2021-03-15 DIAGNOSIS — J9691 Respiratory failure, unspecified with hypoxia: Secondary | ICD-10-CM | POA: Diagnosis not present

## 2021-03-16 DIAGNOSIS — Z992 Dependence on renal dialysis: Secondary | ICD-10-CM | POA: Diagnosis not present

## 2021-03-16 DIAGNOSIS — N186 End stage renal disease: Secondary | ICD-10-CM | POA: Diagnosis not present

## 2021-03-19 DIAGNOSIS — N2581 Secondary hyperparathyroidism of renal origin: Secondary | ICD-10-CM | POA: Diagnosis not present

## 2021-03-19 DIAGNOSIS — N186 End stage renal disease: Secondary | ICD-10-CM | POA: Diagnosis not present

## 2021-03-19 DIAGNOSIS — Z992 Dependence on renal dialysis: Secondary | ICD-10-CM | POA: Diagnosis not present

## 2021-03-21 DIAGNOSIS — N186 End stage renal disease: Secondary | ICD-10-CM | POA: Diagnosis not present

## 2021-03-21 DIAGNOSIS — Z992 Dependence on renal dialysis: Secondary | ICD-10-CM | POA: Diagnosis not present

## 2021-03-22 DIAGNOSIS — Z299 Encounter for prophylactic measures, unspecified: Secondary | ICD-10-CM | POA: Diagnosis not present

## 2021-03-22 DIAGNOSIS — H109 Unspecified conjunctivitis: Secondary | ICD-10-CM | POA: Diagnosis not present

## 2021-03-22 DIAGNOSIS — Z2821 Immunization not carried out because of patient refusal: Secondary | ICD-10-CM | POA: Diagnosis not present

## 2021-03-22 DIAGNOSIS — I10 Essential (primary) hypertension: Secondary | ICD-10-CM | POA: Diagnosis not present

## 2021-03-22 DIAGNOSIS — Z7689 Persons encountering health services in other specified circumstances: Secondary | ICD-10-CM | POA: Diagnosis not present

## 2021-03-22 DIAGNOSIS — M79671 Pain in right foot: Secondary | ICD-10-CM | POA: Diagnosis not present

## 2021-03-22 DIAGNOSIS — E1165 Type 2 diabetes mellitus with hyperglycemia: Secondary | ICD-10-CM | POA: Diagnosis not present

## 2021-03-23 DIAGNOSIS — Z992 Dependence on renal dialysis: Secondary | ICD-10-CM | POA: Diagnosis not present

## 2021-03-23 DIAGNOSIS — N186 End stage renal disease: Secondary | ICD-10-CM | POA: Diagnosis not present

## 2021-03-26 DIAGNOSIS — Z992 Dependence on renal dialysis: Secondary | ICD-10-CM | POA: Diagnosis not present

## 2021-03-26 DIAGNOSIS — N186 End stage renal disease: Secondary | ICD-10-CM | POA: Diagnosis not present

## 2021-03-28 DIAGNOSIS — Z992 Dependence on renal dialysis: Secondary | ICD-10-CM | POA: Diagnosis not present

## 2021-03-28 DIAGNOSIS — N186 End stage renal disease: Secondary | ICD-10-CM | POA: Diagnosis not present

## 2021-03-30 DIAGNOSIS — Z992 Dependence on renal dialysis: Secondary | ICD-10-CM | POA: Diagnosis not present

## 2021-03-30 DIAGNOSIS — N186 End stage renal disease: Secondary | ICD-10-CM | POA: Diagnosis not present

## 2021-04-02 DIAGNOSIS — D509 Iron deficiency anemia, unspecified: Secondary | ICD-10-CM | POA: Diagnosis not present

## 2021-04-02 DIAGNOSIS — N2581 Secondary hyperparathyroidism of renal origin: Secondary | ICD-10-CM | POA: Diagnosis not present

## 2021-04-02 DIAGNOSIS — N186 End stage renal disease: Secondary | ICD-10-CM | POA: Diagnosis not present

## 2021-04-02 DIAGNOSIS — Z992 Dependence on renal dialysis: Secondary | ICD-10-CM | POA: Diagnosis not present

## 2021-04-02 DIAGNOSIS — E119 Type 2 diabetes mellitus without complications: Secondary | ICD-10-CM | POA: Diagnosis not present

## 2021-04-04 DIAGNOSIS — N186 End stage renal disease: Secondary | ICD-10-CM | POA: Diagnosis not present

## 2021-04-04 DIAGNOSIS — Z992 Dependence on renal dialysis: Secondary | ICD-10-CM | POA: Diagnosis not present

## 2021-04-05 DIAGNOSIS — I10 Essential (primary) hypertension: Secondary | ICD-10-CM | POA: Diagnosis not present

## 2021-04-05 DIAGNOSIS — E7800 Pure hypercholesterolemia, unspecified: Secondary | ICD-10-CM | POA: Diagnosis not present

## 2021-04-06 DIAGNOSIS — N186 End stage renal disease: Secondary | ICD-10-CM | POA: Diagnosis not present

## 2021-04-06 DIAGNOSIS — Z992 Dependence on renal dialysis: Secondary | ICD-10-CM | POA: Diagnosis not present

## 2021-04-09 DIAGNOSIS — Z992 Dependence on renal dialysis: Secondary | ICD-10-CM | POA: Diagnosis not present

## 2021-04-09 DIAGNOSIS — N186 End stage renal disease: Secondary | ICD-10-CM | POA: Diagnosis not present

## 2021-04-11 DIAGNOSIS — N186 End stage renal disease: Secondary | ICD-10-CM | POA: Diagnosis not present

## 2021-04-11 DIAGNOSIS — Z992 Dependence on renal dialysis: Secondary | ICD-10-CM | POA: Diagnosis not present

## 2021-04-13 DIAGNOSIS — Z992 Dependence on renal dialysis: Secondary | ICD-10-CM | POA: Diagnosis not present

## 2021-04-13 DIAGNOSIS — N186 End stage renal disease: Secondary | ICD-10-CM | POA: Diagnosis not present

## 2021-04-15 DIAGNOSIS — J9691 Respiratory failure, unspecified with hypoxia: Secondary | ICD-10-CM | POA: Diagnosis not present

## 2021-04-16 DIAGNOSIS — N186 End stage renal disease: Secondary | ICD-10-CM | POA: Diagnosis not present

## 2021-04-16 DIAGNOSIS — Z992 Dependence on renal dialysis: Secondary | ICD-10-CM | POA: Diagnosis not present

## 2021-04-18 DIAGNOSIS — Z992 Dependence on renal dialysis: Secondary | ICD-10-CM | POA: Diagnosis not present

## 2021-04-18 DIAGNOSIS — N186 End stage renal disease: Secondary | ICD-10-CM | POA: Diagnosis not present

## 2021-04-20 DIAGNOSIS — Z992 Dependence on renal dialysis: Secondary | ICD-10-CM | POA: Diagnosis not present

## 2021-04-20 DIAGNOSIS — N186 End stage renal disease: Secondary | ICD-10-CM | POA: Diagnosis not present

## 2021-04-22 ENCOUNTER — Other Ambulatory Visit: Payer: Self-pay

## 2021-04-22 ENCOUNTER — Encounter (HOSPITAL_COMMUNITY): Payer: Self-pay

## 2021-04-22 ENCOUNTER — Emergency Department (HOSPITAL_COMMUNITY): Payer: Medicare Other

## 2021-04-22 ENCOUNTER — Observation Stay (HOSPITAL_COMMUNITY)
Admission: EM | Admit: 2021-04-22 | Discharge: 2021-04-23 | Disposition: A | Discharge status: Home / Self Care | Payer: Medicare Other | Attending: Internal Medicine | Admitting: Internal Medicine

## 2021-04-22 DIAGNOSIS — D72829 Elevated white blood cell count, unspecified: Secondary | ICD-10-CM | POA: Insufficient documentation

## 2021-04-22 DIAGNOSIS — R079 Chest pain, unspecified: Secondary | ICD-10-CM | POA: Diagnosis not present

## 2021-04-22 DIAGNOSIS — Z992 Dependence on renal dialysis: Secondary | ICD-10-CM | POA: Insufficient documentation

## 2021-04-22 DIAGNOSIS — E1165 Type 2 diabetes mellitus with hyperglycemia: Secondary | ICD-10-CM | POA: Insufficient documentation

## 2021-04-22 DIAGNOSIS — Z7984 Long term (current) use of oral hypoglycemic drugs: Secondary | ICD-10-CM | POA: Diagnosis not present

## 2021-04-22 DIAGNOSIS — Z794 Long term (current) use of insulin: Secondary | ICD-10-CM | POA: Diagnosis not present

## 2021-04-22 DIAGNOSIS — D539 Nutritional anemia, unspecified: Secondary | ICD-10-CM | POA: Diagnosis not present

## 2021-04-22 DIAGNOSIS — Z20822 Contact with and (suspected) exposure to covid-19: Secondary | ICD-10-CM | POA: Insufficient documentation

## 2021-04-22 DIAGNOSIS — D631 Anemia in chronic kidney disease: Secondary | ICD-10-CM | POA: Diagnosis not present

## 2021-04-22 DIAGNOSIS — Z743 Need for continuous supervision: Secondary | ICD-10-CM | POA: Diagnosis not present

## 2021-04-22 DIAGNOSIS — N186 End stage renal disease: Secondary | ICD-10-CM | POA: Diagnosis not present

## 2021-04-22 DIAGNOSIS — I120 Hypertensive chronic kidney disease with stage 5 chronic kidney disease or end stage renal disease: Secondary | ICD-10-CM | POA: Diagnosis not present

## 2021-04-22 DIAGNOSIS — Z79899 Other long term (current) drug therapy: Secondary | ICD-10-CM | POA: Insufficient documentation

## 2021-04-22 DIAGNOSIS — R0689 Other abnormalities of breathing: Secondary | ICD-10-CM | POA: Diagnosis not present

## 2021-04-22 DIAGNOSIS — I517 Cardiomegaly: Secondary | ICD-10-CM | POA: Diagnosis not present

## 2021-04-22 DIAGNOSIS — E782 Mixed hyperlipidemia: Secondary | ICD-10-CM | POA: Insufficient documentation

## 2021-04-22 DIAGNOSIS — E1122 Type 2 diabetes mellitus with diabetic chronic kidney disease: Secondary | ICD-10-CM | POA: Diagnosis not present

## 2021-04-22 DIAGNOSIS — Z7982 Long term (current) use of aspirin: Secondary | ICD-10-CM | POA: Diagnosis not present

## 2021-04-22 DIAGNOSIS — I10 Essential (primary) hypertension: Secondary | ICD-10-CM | POA: Diagnosis not present

## 2021-04-22 DIAGNOSIS — E1169 Type 2 diabetes mellitus with other specified complication: Secondary | ICD-10-CM

## 2021-04-22 DIAGNOSIS — R0789 Other chest pain: Secondary | ICD-10-CM | POA: Diagnosis not present

## 2021-04-22 DIAGNOSIS — M546 Pain in thoracic spine: Secondary | ICD-10-CM | POA: Diagnosis present

## 2021-04-22 DIAGNOSIS — R6889 Other general symptoms and signs: Secondary | ICD-10-CM | POA: Diagnosis not present

## 2021-04-22 MED ORDER — ASPIRIN 81 MG PO CHEW
324.0000 mg | CHEWABLE_TABLET | Freq: Once | ORAL | Status: AC
  Administered 2021-04-22: 324 mg via ORAL
  Filled 2021-04-22: qty 4
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
## 2021-04-23 ENCOUNTER — Observation Stay (HOSPITAL_COMMUNITY): Payer: Medicare Other

## 2021-04-23 ENCOUNTER — Observation Stay (HOSPITAL_BASED_OUTPATIENT_CLINIC_OR_DEPARTMENT_OTHER): Payer: Medicare Other

## 2021-04-23 DIAGNOSIS — E1165 Type 2 diabetes mellitus with hyperglycemia: Secondary | ICD-10-CM

## 2021-04-23 DIAGNOSIS — D539 Nutritional anemia, unspecified: Secondary | ICD-10-CM | POA: Diagnosis not present

## 2021-04-23 DIAGNOSIS — Z20822 Contact with and (suspected) exposure to covid-19: Secondary | ICD-10-CM | POA: Diagnosis not present

## 2021-04-23 DIAGNOSIS — E782 Mixed hyperlipidemia: Secondary | ICD-10-CM | POA: Diagnosis not present

## 2021-04-23 DIAGNOSIS — R079 Chest pain, unspecified: Secondary | ICD-10-CM | POA: Diagnosis present

## 2021-04-23 DIAGNOSIS — Z7984 Long term (current) use of oral hypoglycemic drugs: Secondary | ICD-10-CM | POA: Diagnosis not present

## 2021-04-23 DIAGNOSIS — D6489 Other specified anemias: Secondary | ICD-10-CM | POA: Diagnosis not present

## 2021-04-23 DIAGNOSIS — E875 Hyperkalemia: Secondary | ICD-10-CM | POA: Diagnosis not present

## 2021-04-23 DIAGNOSIS — Z992 Dependence on renal dialysis: Secondary | ICD-10-CM | POA: Diagnosis not present

## 2021-04-23 DIAGNOSIS — N250 Renal osteodystrophy: Secondary | ICD-10-CM | POA: Diagnosis not present

## 2021-04-23 DIAGNOSIS — N186 End stage renal disease: Secondary | ICD-10-CM

## 2021-04-23 DIAGNOSIS — Z794 Long term (current) use of insulin: Secondary | ICD-10-CM | POA: Diagnosis not present

## 2021-04-23 DIAGNOSIS — M546 Pain in thoracic spine: Secondary | ICD-10-CM | POA: Diagnosis present

## 2021-04-23 DIAGNOSIS — I10 Essential (primary) hypertension: Secondary | ICD-10-CM

## 2021-04-23 DIAGNOSIS — I120 Hypertensive chronic kidney disease with stage 5 chronic kidney disease or end stage renal disease: Secondary | ICD-10-CM | POA: Diagnosis not present

## 2021-04-23 DIAGNOSIS — Z7982 Long term (current) use of aspirin: Secondary | ICD-10-CM | POA: Diagnosis not present

## 2021-04-23 DIAGNOSIS — Z79899 Other long term (current) drug therapy: Secondary | ICD-10-CM | POA: Diagnosis not present

## 2021-04-23 DIAGNOSIS — D72829 Elevated white blood cell count, unspecified: Secondary | ICD-10-CM | POA: Diagnosis not present

## 2021-04-23 DIAGNOSIS — E1122 Type 2 diabetes mellitus with diabetic chronic kidney disease: Secondary | ICD-10-CM | POA: Diagnosis not present

## 2021-04-23 DIAGNOSIS — D631 Anemia in chronic kidney disease: Secondary | ICD-10-CM | POA: Diagnosis not present

## 2021-04-23 DIAGNOSIS — M7989 Other specified soft tissue disorders: Secondary | ICD-10-CM | POA: Diagnosis not present

## 2021-04-23 LAB — BASIC METABOLIC PANEL
Anion gap: 14 (ref 5–15)
Anion gap: 16 — ABNORMAL HIGH (ref 5–15)
BUN: 87 mg/dL — ABNORMAL HIGH (ref 6–20)
BUN: 89 mg/dL — ABNORMAL HIGH (ref 6–20)
CO2: 20 mmol/L — ABNORMAL LOW (ref 22–32)
CO2: 21 mmol/L — ABNORMAL LOW (ref 22–32)
Calcium: 8.5 mg/dL — ABNORMAL LOW (ref 8.9–10.3)
Calcium: 8.6 mg/dL — ABNORMAL LOW (ref 8.9–10.3)
Chloride: 102 mmol/L (ref 98–111)
Chloride: 99 mmol/L (ref 98–111)
Creatinine, Ser: 14.09 mg/dL — ABNORMAL HIGH (ref 0.44–1.00)
Creatinine, Ser: 14.54 mg/dL — ABNORMAL HIGH (ref 0.44–1.00)
GFR, Estimated: 3 mL/min — ABNORMAL LOW (ref >60)
GFR, Estimated: 3 mL/min — ABNORMAL LOW (ref >60)
Glucose, Bld: 162 mg/dL — ABNORMAL HIGH (ref 70–99)
Glucose, Bld: 173 mg/dL — ABNORMAL HIGH (ref 70–99)
Potassium: 6 mmol/L — ABNORMAL HIGH (ref 3.5–5.1)
Potassium: 5.5 mmol/L — ABNORMAL HIGH (ref 3.5–5.1)
Sodium: 136 mmol/L (ref 135–145)
Sodium: 136 mmol/L (ref 135–145)

## 2021-04-23 LAB — HIV ANTIBODY (ROUTINE TESTING W REFLEX): HIV Screen 4th Generation wRfx: NONREACTIVE

## 2021-04-23 LAB — APTT: aPTT: 40 seconds — ABNORMAL HIGH (ref 24–36)

## 2021-04-23 LAB — ECHOCARDIOGRAM LIMITED
Height: 65 in
S' Lateral: 3.3 cm
Weight: 6084.7 oz

## 2021-04-23 LAB — CBC
HCT: 29.7 % — ABNORMAL LOW (ref 36.0–46.0)
HCT: 25.9 % — ABNORMAL LOW (ref 36.0–46.0)
Hemoglobin: 8.9 g/dL — ABNORMAL LOW (ref 12.0–15.0)
Hemoglobin: 7.8 g/dL — ABNORMAL LOW (ref 12.0–15.0)
MCH: 30.5 pg (ref 26.0–34.0)
MCH: 30.5 pg (ref 26.0–34.0)
MCHC: 30 g/dL (ref 30.0–36.0)
MCHC: 30.1 g/dL (ref 30.0–36.0)
MCV: 101.7 fL — ABNORMAL HIGH (ref 80.0–100.0)
MCV: 101.2 fL — ABNORMAL HIGH (ref 80.0–100.0)
Platelets: 197 10*3/uL (ref 150–400)
Platelets: 169 10*3/uL (ref 150–400)
RBC: 2.92 MIL/uL — ABNORMAL LOW (ref 3.87–5.11)
RBC: 2.56 MIL/uL — ABNORMAL LOW (ref 3.87–5.11)
RDW: 14.2 % (ref 11.5–15.5)
RDW: 14.2 % (ref 11.5–15.5)
WBC: 12.5 10*3/uL — ABNORMAL HIGH (ref 4.0–10.5)
WBC: 10.6 10*3/uL — ABNORMAL HIGH (ref 4.0–10.5)
nRBC: 0 % (ref 0.0–0.2)
nRBC: 0 % (ref 0.0–0.2)

## 2021-04-23 LAB — TROPONIN I (HIGH SENSITIVITY)
Troponin I (High Sensitivity): 56 ng/L — ABNORMAL HIGH (ref <18)
Troponin I (High Sensitivity): 56 ng/L — ABNORMAL HIGH (ref <18)

## 2021-04-23 LAB — GLUCOSE, CAPILLARY
Glucose-Capillary: 199 mg/dL — ABNORMAL HIGH (ref 70–99)
Glucose-Capillary: 188 mg/dL — ABNORMAL HIGH (ref 70–99)

## 2021-04-23 LAB — MAGNESIUM: Magnesium: 2.4 mg/dL (ref 1.7–2.4)

## 2021-04-23 LAB — FOLATE: Folate: 7.8 ng/mL (ref >5.9)

## 2021-04-23 LAB — RESP PANEL BY RT-PCR (FLU A&B, COVID) ARPGX2
Influenza A by PCR: NEGATIVE
Influenza B by PCR: NEGATIVE
SARS Coronavirus 2 by RT PCR: NEGATIVE

## 2021-04-23 LAB — VITAMIN B12: Vitamin B-12: 599 pg/mL (ref 180–914)

## 2021-04-23 LAB — HEMOGLOBIN A1C
Hgb A1c MFr Bld: 8.9 % — ABNORMAL HIGH (ref 4.8–5.6)
Mean Plasma Glucose: 208.73 mg/dL

## 2021-04-23 LAB — HEPATITIS B SURFACE ANTIBODY,QUALITATIVE: Hep B S Ab: NONREACTIVE

## 2021-04-23 LAB — PHOSPHORUS: Phosphorus: 9.6 mg/dL — ABNORMAL HIGH (ref 2.5–4.6)

## 2021-04-23 LAB — HEPATITIS B SURFACE ANTIGEN: Hepatitis B Surface Ag: NONREACTIVE

## 2021-04-23 MED ORDER — ISOSORBIDE MONONITRATE ER 60 MG PO TB24: 60.0000 mg | ORAL_TABLET | Freq: Every day | ORAL | Status: DC

## 2021-04-23 MED ORDER — DARBEPOETIN ALFA 60 MCG/0.3ML IJ SOSY
PREFILLED_SYRINGE | INTRAMUSCULAR | Status: AC
  Administered 2021-04-23: 60 ug via INTRAVENOUS
  Filled 2021-04-23: qty 0.3

## 2021-04-23 MED ORDER — GABAPENTIN 100 MG PO CAPS
100.0000 mg | ORAL_CAPSULE | ORAL | Status: DC
  Administered 2021-04-23: 100 mg via ORAL
  Filled 2021-04-23: qty 1

## 2021-04-23 MED ORDER — NITROGLYCERIN 0.4 MG SL SUBL: 0.4000 mg | SUBLINGUAL_TABLET | SUBLINGUAL | Status: DC | PRN

## 2021-04-23 MED ORDER — INSULIN GLARGINE-YFGN 100 UNIT/ML ~~LOC~~ SOLN
6.0000 [IU] | Freq: Every day | SUBCUTANEOUS | Status: DC
  Filled 2021-04-23: qty 0.06

## 2021-04-23 MED ORDER — INSULIN ASPART 100 UNIT/ML IJ SOLN: 0.0000 [IU] | Freq: Every day | INTRAMUSCULAR | Status: DC

## 2021-04-23 MED ORDER — ALTEPLASE 2 MG IJ SOLR: 2.0000 mg | Freq: Once | INTRAMUSCULAR | Status: DC | PRN

## 2021-04-23 MED ORDER — ISOSORBIDE MONONITRATE ER 60 MG PO TB24: 60.0000 mg | ORAL_TABLET | Freq: Every day | ORAL | 11 refills | Status: DC

## 2021-04-23 MED ORDER — SODIUM CHLORIDE 0.9 % IV SOLN: 100.0000 mL | INTRAVENOUS | Status: DC | PRN

## 2021-04-23 MED ORDER — TIZANIDINE HCL 4 MG PO TABS
4.0000 mg | ORAL_TABLET | Freq: Three times a day (TID) | ORAL | Status: DC
  Administered 2021-04-23: 4 mg via ORAL
  Filled 2021-04-23: qty 1

## 2021-04-23 MED ORDER — DOXERCALCIFEROL 4 MCG/2ML IV SOLN: 5.0000 ug | INTRAVENOUS | Status: DC

## 2021-04-23 MED ORDER — GABAPENTIN 100 MG PO CAPS: 100.0000 mg | ORAL_CAPSULE | ORAL | Status: DC

## 2021-04-23 MED ORDER — HEPARIN SODIUM (PORCINE) 1000 UNIT/ML DIALYSIS
1000.0000 [IU] | INTRAMUSCULAR | Status: DC | PRN
  Administered 2021-04-23: 4200 [IU] via INTRAVENOUS_CENTRAL

## 2021-04-23 MED ORDER — ISOSORBIDE MONONITRATE ER 60 MG PO TB24
30.0000 mg | ORAL_TABLET | Freq: Every day | ORAL | Status: DC
  Administered 2021-04-23: 30 mg via ORAL
  Filled 2021-04-23: qty 1

## 2021-04-23 MED ORDER — AMLODIPINE BESYLATE 5 MG PO TABS
10.0000 mg | ORAL_TABLET | Freq: Every day | ORAL | Status: DC
  Administered 2021-04-23: 10 mg via ORAL
  Filled 2021-04-23: qty 2

## 2021-04-23 MED ORDER — PANTOPRAZOLE SODIUM 40 MG PO TBEC
40.0000 mg | DELAYED_RELEASE_TABLET | Freq: Every day | ORAL | Status: DC
  Administered 2021-04-23: 40 mg via ORAL
  Filled 2021-04-23: qty 1

## 2021-04-23 MED ORDER — HEPARIN SODIUM (PORCINE) 1000 UNIT/ML DIALYSIS: 20.0000 [IU]/kg | INTRAMUSCULAR | Status: DC | PRN

## 2021-04-23 MED ORDER — DOXERCALCIFEROL 4 MCG/2ML IV SOLN
INTRAVENOUS | Status: AC
  Administered 2021-04-23: 5 ug via INTRAVENOUS
  Filled 2021-04-23: qty 2

## 2021-04-23 MED ORDER — HEPARIN SODIUM (PORCINE) 1000 UNIT/ML IJ SOLN
INTRAMUSCULAR | Status: AC
  Administered 2021-04-23: 3500 [IU] via INTRAVENOUS_CENTRAL
  Filled 2021-04-23: qty 10

## 2021-04-23 MED ORDER — ATORVASTATIN CALCIUM 10 MG PO TABS
10.0000 mg | ORAL_TABLET | Freq: Every day | ORAL | Status: DC
  Administered 2021-04-23: 10 mg via ORAL
  Filled 2021-04-23: qty 1

## 2021-04-23 MED ORDER — LISINOPRIL 10 MG PO TABS
20.0000 mg | ORAL_TABLET | Freq: Every day | ORAL | Status: DC
  Administered 2021-04-23: 20 mg via ORAL
  Filled 2021-04-23: qty 2

## 2021-04-23 MED ORDER — ASPIRIN EC 81 MG PO TBEC
81.0000 mg | DELAYED_RELEASE_TABLET | Freq: Every day | ORAL | Status: DC
  Administered 2021-04-23: 81 mg via ORAL
  Filled 2021-04-23: qty 1

## 2021-04-23 MED ORDER — SODIUM ZIRCONIUM CYCLOSILICATE 5 G PO PACK
10.0000 g | PACK | Freq: Once | ORAL | Status: AC
  Administered 2021-04-23: 10 g via ORAL
  Filled 2021-04-23: qty 2

## 2021-04-23 MED ORDER — HEPARIN SODIUM (PORCINE) 5000 UNIT/ML IJ SOLN
5000.0000 [IU] | Freq: Three times a day (TID) | INTRAMUSCULAR | Status: DC
  Administered 2021-04-23: 5000 [IU] via SUBCUTANEOUS
  Filled 2021-04-23: qty 1

## 2021-04-23 MED ORDER — INSULIN ASPART 100 UNIT/ML IJ SOLN
0.0000 [IU] | Freq: Three times a day (TID) | INTRAMUSCULAR | Status: DC
  Administered 2021-04-23: 1 [IU] via SUBCUTANEOUS

## 2021-04-23 MED ORDER — CHLORHEXIDINE GLUCONATE CLOTH 2 % EX PADS: 6.0000 | MEDICATED_PAD | Freq: Every day | CUTANEOUS | Status: DC

## 2021-04-23 MED ORDER — DARBEPOETIN ALFA 60 MCG/0.3ML IJ SOSY
60.0000 ug | PREFILLED_SYRINGE | Freq: Once | INTRAMUSCULAR | Status: AC
  Filled 2021-04-23: qty 0.3

## 2021-04-23 MED ORDER — ISOSORBIDE MONONITRATE ER 60 MG PO TB24: 30.0000 mg | ORAL_TABLET | Freq: Every day | ORAL | Status: DC

## 2021-04-23 MED ORDER — CARVEDILOL 12.5 MG PO TABS
25.0000 mg | ORAL_TABLET | Freq: Two times a day (BID) | ORAL | Status: DC
  Administered 2021-04-23: 25 mg via ORAL
  Filled 2021-04-23: qty 2

## 2021-04-23 MED ORDER — INSULIN ASPART 100 UNIT/ML IJ SOLN
2.0000 [IU] | Freq: Three times a day (TID) | INTRAMUSCULAR | Status: DC
  Administered 2021-04-23: 2 [IU] via SUBCUTANEOUS

## 2021-04-23 NOTE — Discharge Summary (Signed)
Courtney Gray, is a 51 y.o. female  DOB November 18, 1970  MRN [id number].  Admission date:  04/22/2021  Admitting Physician  Bernadette Hoit, DO  Discharge Date:  04/23/2021   Primary MD  Medicine, [facility]  Recommendations for primary care physician for things to follow:   1)INcrease isosorbide/Imdur to 60 mg daily-- 2) please follow-up with PCP at Medicine, [facility] -as previously advised 3) please go for hemodialysis on Thursday, 25 April 2021   Admission Diagnosis  Chest pain [R07.9] Nonspecific chest pain [R07.9]   Discharge Diagnosis  Chest pain [R07.9] Nonspecific chest pain [R07.9]    Principal Problem:   Chest pain      Past Medical History:  Diagnosis Date   Diabetes mellitus without complication ([place])    High cholesterol    Hypertension    Renal disorder     Past Surgical History:  Procedure Laterality Date   AV FISTULA PLACEMENT     x2, unsuccessful.    IR FLUORO GUIDE CV LINE LEFT  12/14/2020   IR FLUORO GUIDE CV LINE LEFT  02/20/2021   IR FLUORO GUIDE CV LINE RIGHT  08/28/2020   IR PERC TUN PERIT CATH WO PORT S&I /IMAG  09/06/2020   IR PTA VENOUS EXCEPT DIALYSIS CIRCUIT  02/20/2021   IR US GUIDE VASC ACCESS LEFT  09/06/2020   IR VENOCAVAGRAM SVC  02/20/2021     HPI  from the history and physical done on the day of admission:     Chief Complaint: Chest pain   HPI: Courtney Gray is a 51 y.o. 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  Pain was described as pressure, "like an elephant  sitting on my chest ".  Patient states that the chest pain also have a reproducible component.  Chest pain was rated as 10/10 on pain scale, it was nonradiating and self resolved after about 30 minutes there was no diaphoresis.  Chest pain occurred again in the evening, she took nitroglycerin and this alleviated the chest pain she decided to go to the ED for further evaluation and management.  Patient ambulates with a cane at baseline   ED Course:  In the emergency department, she was hemodynamically stable.  BP was 159/69.  Work-up in the ED showed hyperkalemia, hyperglycemia, BUN/creatinine 87/14.09 with an eGFR of 3.  CBC showed leukocytosis, macrocytic anemia.  Troponin x1 was 56. Chest x-ray showed stable cardiomegaly without evidence of acute or active cardiopulmonary disease Aspirin 324 mg x 1 was given, Lokelma was given.  Hospitalist was asked to admit patient for further evaluation and management.   Review of Systems: A full 10 point Review of Systems was done, except as stated above, all other  Review of systems were negative.       Hospital Course:       Chest pain  Cardiovascular risk factors include hypertension, hyperlipidemia, obesity, T2DM Continue on telemetry monitored unit no significant arrhythmias   Troponins x 1 - 56 (chronically elevated) -Patient ruled out for ACS by EKGs and cardiac enzymes EKG personally reviewed showed NSR at a rate of 68 continue bpm -Cardiology consult appreciated Echo with EF of 55 to 60% , Continue aspirin,  -Cardiology service recommends increasing isosorbide to 60 mg daily -Cardiology service recommends against further cardiovascular testing at this time   DM2--A1c 8.9 reflecting uncontrolled DM with hyperglycemia PTA- -Follow-up with PCP for adjustment of insulin regimen strongly advised  Hyperkalemia K+ 6.0, no acute EKG changes, Lokelma was given -Corrected with hemodialysis  Leukocytosis possibly reactive -WBC is down to 10.6  from 12.5 no evidence of acute infection at this time  Macrocytic anemia as well as anemia of ESRD --Procrit/ESA agent as per nephrology service -   Chronic respiratory failure Continuous supplemental oxygen per home regimen   ESRD on HD (TThS) -Discussed with cardiology/nephrology service -Patient received hemodialysis prior to discharge   GERD Continue Protonix  Essential hypertension Continue Norvasc, lisinopril, and Coreg -Isosorbide adjusted  Mixed hyperlipidemia Continue Lipitor   Chronic neuropathic pain Continue the use of Neurontin and tizanidine.   Secondary hyperparathyroidism Continue Hectorol  Obesity (BMI 59.91 kg/) Patient was counseled on diet and lifestyle modification Patient will need to follow-up with outpatient PCP for weight loss program     Discharge Condition: Stable, chest pain-free  Follow UP   Follow-up Information     Medicine, [facility]. Schedule an appointment as soon as possible for a visit in 1 week(s).   Specialty: Internal Medicine Contact information: [place] [address] [phone]                  Consults obtained -cardiology and nephrology  Diet and Activity recommendation:  As advised  Discharge Instructions     Discharge Instructions     Call MD for:  difficulty breathing, headache or visual disturbances   Complete by: As directed    Call MD for:  persistant dizziness or light-headedness   Complete by: As directed    Call MD for:  persistant nausea and vomiting   Complete by: As directed    Call MD for:  severe uncontrolled pain   Complete by: As directed    Call MD for:  temperature >100.4   Complete by: As directed    Diet - low sodium heart healthy   Complete by: As directed    Diet Carb Modified   Complete by: As directed    Discharge instructions   Complete by: As directed    1)INcrease isosorbide/Imdur to 60 mg daily-- 2) please follow-up with PCP at Medicine, [facility]  Internal -as previously advised 3) please go for hemodialysis on Thursday, 25 April 2021   Increase activity slowly   Complete by: As directed    No wound care   Complete by: As directed          Discharge Medications     Allergies as of 04/23/2021       Reactions   Benzonatate Other (See Comments)   Hallucinations   Hydralazine Other (See Comments)   Hallucinations   Amoxicillin Nausea And Vomiting   Clonidine Other (See Comments)   Altered mental state   Morphine Itching   Oxycodone Itching  Medication List     TAKE these medications    acetaminophen 500 MG tablet Commonly known as: TYLENOL Take 1,000 mg by mouth every 6 (six) hours as needed for moderate pain or headache.   amLODipine 10 MG tablet Commonly known as: NORVASC Take 1 tablet (10 mg total) by mouth daily. What changed: when to take this   aspirin EC 81 MG tablet Take 1 tablet (81 mg total) by mouth daily with breakfast.   atorvastatin 10 MG tablet Commonly known as: LIPITOR Take 1 tablet (10 mg total) by mouth daily. What changed: when to take this   carvedilol 25 MG tablet Commonly known as: COREG Take 1 tablet (25 mg total) by mouth 2 (two) times daily. What changed: additional instructions   CHLORASEPTIC MT Use as directed 4 sprays in the mouth or throat 4 (four) times daily as needed (throat pain).   diphenhydrAMINE 25 MG tablet Commonly known as: BENADRYL Take 25 mg by mouth daily as needed for allergies or sleep.   docusate sodium 100 MG capsule Commonly known as: COLACE Take 100 mg by mouth 2 (two) times daily.   doxercalciferol 4 MCG/2ML injection Commonly known as: HECTOROL Inject 2.5 mLs (5 mcg total) into the vein Every Tuesday,Thursday,and Saturday with dialysis.   gabapentin 100 MG capsule Commonly known as: NEURONTIN Take 100-200 mg by mouth See admin instructions. Take 100 mg twice daily on non dialysis days.  Take 100 mg at 9am before dialysis. Take  100-200 mg at bedtime. May take a 100 mg in the evening as needed for pain. Tuesdays, Thursdays,and Saturdays.   glipiZIDE 2.5 MG 24 hr tablet Commonly known as: GLUCOTROL XL Take 2.5 mg by mouth daily.   isosorbide mononitrate 60 MG 24 hr tablet Commonly known as: IMDUR Take 1 tablet (60 mg total) by mouth daily. What changed:  medication strength how much to take   lanthanum 500 MG chewable tablet Commonly known as: FOSRENOL Chew 3 tablets by mouth 3 (three) times daily.   Lantus SoloStar 100 UNIT/ML Solostar Pen Generic drug: insulin glargine Inject 12 Units into the skin at bedtime.   lisinopril 20 MG tablet Commonly known as: ZESTRIL Take 1 tablet (20 mg total) by mouth daily.   loperamide 2 MG tablet Commonly known as: IMODIUM A-D Take 4 mg by mouth as needed for diarrhea or loose stools.   nitroGLYCERIN 0.4 MG SL tablet Commonly known as: NITROSTAT Place 0.4 mg under the tongue every 5 (five) minutes as needed for chest pain.   pantoprazole 40 MG tablet Commonly known as: PROTONIX Take 1 tablet (40 mg total) by mouth daily.   sodium chloride 0.65 % Soln nasal spray Commonly known as: OCEAN Place 1 spray into both nostrils 2 (two) times daily as needed for congestion.   SPS 15 GM/60ML suspension Generic drug: sodium polystyrene Take 60 mLs by mouth See admin instructions. Weekly as needed to lower potassium   tiZANidine 4 MG tablet Commonly known as: ZANAFLEX Take 4 mg by mouth every 8 (eight) hours. Take with gabapentin at same time   Tums Smoothies 750 MG chewable tablet Generic drug: calcium carbonate Chew 750-1,500 mg by mouth See admin instructions. Take 1500 mg with each meal and 750 mg with each snack        Major procedures and Radiology Reports - PLEASE review detailed and final reports for all details, in brief -   DG Chest 2 View  Result Date: 04/22/2021 CLINICAL DATA:  Chest pain. EXAM:  CHEST - 2 VIEW COMPARISON:  None. FINDINGS: There  is stable left internal jugular venous catheter positioning. There is mild to moderate severity enlargement of the cardiac silhouette which is unchanged in severity. Both lungs are clear. The visualized skeletal structures are unremarkable. IMPRESSION: Stable cardiomegaly without evidence of acute or active cardiopulmonary disease. Electronically Signed   By: Virgina Norfolk M.D.   On: 04/22/2021 21:56   DG Foot 2 Views Right  Result Date: 04/23/2021 CLINICAL DATA:  Evaluate for fracture. Complains of pain in the first and second phalanx of the right foot. EXAM: RIGHT FOOT - 2 VIEW COMPARISON:  None. FINDINGS: The bones appear diffusely osteopenic. There is diffuse soft tissue swelling identified. Extensive vascular calcifications are identified. Hallux valgus deformity. Second through fifth hammertoe deformities. No signs of acute fracture or dislocation. IMPRESSION: 1. No acute bone abnormality. 2. Osteopenia. 3. Soft tissue swelling. Electronically Signed   By: Kerby Moors M.D.   On: 04/23/2021 05:28   ECHOCARDIOGRAM LIMITED  Result Date: 04/23/2021    ECHOCARDIOGRAM LIMITED REPORT   Patient Name:   ALPHONSINE MINIUM Date of Exam: 04/23/2021 Medical Rec #:  [id number]       Height:       65.0 in Accession #:    [id number]      Weight:       380.3 lb Date of Birth:  Oct 21, 1970        BSA:          2.599 m Patient Age:    18 years        BP:           165/93 mmHg Patient Gender: F               HR:           68 bpm. Exam Location:  [facility] Procedure: Limited Echo Indications:    Assess EF and wall motion  History:        Patient has prior history of Echocardiogram examinations, most                 recent 11/27/2020. Signs/Symptoms:Chest Pain; Risk                 Factors:Hypertension and Diabetes.  Sonographer:    Wenda Low Referring Phys: [id number] Erma Heritage  Sonographer Comments: Technically challenging study due to limited acoustic windows, Technically difficult study due to poor echo  windows and patient is morbidly obese. Study completed with patient sitting in recliner. She refused to be moved to the bed. IMPRESSIONS  1. Left ventricular ejection fraction, by estimation, is 55 to 60%. The left ventricle has normal function. Left ventricular endocardial border not optimally defined to evaluate regional wall motion.  2. Limited echo to evaluate LV function. FINDINGS  Left Ventricle: Left ventricular ejection fraction, by estimation, is 55 to 60%. The left ventricle has normal function. Left ventricular endocardial border not optimally defined to evaluate regional wall motion. The left ventricular internal cavity size was normal in size. Suboptimal image quality limits for assessment of left ventricular hypertrophy. LEFT VENTRICLE PLAX 2D LVIDd:         4.80 cm LVIDs:         3.30 cm LV PW:         1.70 cm LV IVS:        1.60 cm  LEFT ATRIUM         Index LA diam:    4.20 cm  1.62 cm/m Carlyle Dolly MD Electronically signed by Carlyle Dolly MD Signature Date/Time: 04/23/2021/11:29:46 AM    Final     Micro Results   Recent Results (from the past 240 hour(s))  Resp Panel by RT-PCR (Flu A&B, Covid) Nasopharyngeal Swab     Status: None   Collection Time: 04/23/21  4:53 AM   Specimen: Nasopharyngeal Swab; Nasopharyngeal(NP) swabs in vial transport medium  Result Value Ref Range Status   SARS Coronavirus 2 by RT PCR NEGATIVE NEGATIVE Final    Comment: (NOTE) SARS-CoV-2 target nucleic acids are NOT DETECTED.  The SARS-CoV-2 RNA is generally detectable in upper respiratory specimens during the acute phase of infection. The lowest concentration of SARS-CoV-2 viral copies this assay can detect is 138 copies/mL. A negative result does not preclude SARS-Cov-2 infection and should not be used as the sole basis for treatment or other patient management decisions. A negative result may occur with  improper specimen collection/handling, submission of specimen other than nasopharyngeal swab,  presence of viral mutation(s) within the areas targeted by this assay, and inadequate number of viral copies(<138 copies/mL). A negative result must be combined with clinical observations, patient history, and epidemiological information. The expected result is Negative.  Fact Sheet for Patients:  EntrepreneurPulse.com.au  Fact Sheet for Healthcare Providers:  IncredibleEmployment.be  This test is no t yet approved or cleared by the [place] FDA and  has been authorized for detection and/or diagnosis of SARS-CoV-2 by FDA under an Emergency Use Authorization (EUA). This EUA will remain  in effect (meaning this test can be used) for the duration of the COVID-19 declaration under Section 564(b)(1) of the Act, 21 U.S.C.section 360bbb-3(b)(1), unless the authorization is terminated  or revoked sooner.       Influenza A by PCR NEGATIVE NEGATIVE Final   Influenza B by PCR NEGATIVE NEGATIVE Final    Comment: (NOTE) The Xpert Xpress SARS-CoV-2/FLU/RSV plus assay is intended as an aid in the diagnosis of influenza from Nasopharyngeal swab specimens and should not be used as a sole basis for treatment. Nasal washings and aspirates are unacceptable for Xpert Xpress SARS-CoV-2/FLU/RSV testing.  Fact Sheet for Patients: EntrepreneurPulse.com.au  Fact Sheet for Healthcare Providers: IncredibleEmployment.be  This test is not yet approved or cleared by the [place] FDA and has been authorized for detection and/or diagnosis of SARS-CoV-2 by FDA under an Emergency Use Authorization (EUA). This EUA will remain in effect (meaning this test can be used) for the duration of the COVID-19 declaration under Section 564(b)(1) of the Act, 21 U.S.C. section 360bbb-3(b)(1), unless the authorization is terminated or revoked.  Performed at [facility], [address] [place], [place] [address]    Today   Subjective     Salima Rumer today has no new complaints, -No further chest pains -Tolerated hemodialysis well          Patient has been seen and examined prior to discharge   Objective   Blood pressure (!) 162/58, pulse 64, temperature 98.6 F (37 C), temperature source Oral, resp. rate 18, height 5' 5" (1.651 m), weight (!) 168.5 kg, SpO2 99 %.   Intake/Output Summary (Last 24 hours) at 04/23/2021 1729 Last data filed at 04/23/2021 1645 Gross per 24 hour  Intake 116 ml  Output 4049 ml  Net -3933 ml    Exam Gen:- Awake Alert, no acute distress, morbidly obese, HEENT:- [place].AT, No sclera icterus Neck-Supple Neck,No JVD,.  Lungs-  CTAB , good air movement bilaterally  CV- S1, S2 normal,  regular Abd-  +ve B.Sounds, Abd Soft, No tenderness,   increased truncal adiposity Extremity/Skin:- No  edema,   good pulses, and RUE AVF, + bruit, pulsatile Psych-affect is appropriate, oriented x3 Neuro-no new focal deficits, no tremors    Data Review   CBC w Diff:  Lab Results  Component Value Date   WBC 10.6 (H) 04/23/2021   HGB 7.8 (L) 04/23/2021   HCT 25.9 (L) 04/23/2021   PLT 169 04/23/2021   LYMPHOPCT 11 12/08/2020   MONOPCT 7 12/08/2020   EOSPCT 4 12/08/2020   BASOPCT 0 12/08/2020    CMP:  Lab Results  Component Value Date   NA 136 04/23/2021   K 5.5 (H) 04/23/2021   CL 99 04/23/2021   CO2 21 (L) 04/23/2021   BUN 89 (H) 04/23/2021   CREATININE 14.54 (H) 04/23/2021   PROT 8.9 (H) 11/26/2020   ALBUMIN 4.3 11/26/2020   BILITOT 0.7 11/26/2020   ALKPHOS 64 11/26/2020   AST 16 11/26/2020   ALT 20 11/26/2020  .   Total Discharge time is about 33 minutes  Roxan Hockey M.D on 04/23/2021 at 5:29 PM  Go to [email or web address] -  for contact info  [facility] - Office  [phone]

## 2021-04-23 NOTE — Progress Notes (Signed)
Patient arrived to floor. Explained admission diagnosis, reason to see the cardiologist and nephrologist.  Educated patient on reasons for chest pain and answered questions as needed for patient.   Explained to patient that she would probably have further cardiac testing (echo, stress test, etc) to rule out cardiac event.  Explained to patient that dialysis would probably be performed later today.  Also explained to patient about meals times and food choices and options with dietary. Patient verbalized understanding at this time.

## 2021-04-23 NOTE — Progress Notes (Signed)
Patient off floor to dialysis

## 2021-04-23 NOTE — Consult Note (Addendum)
Cardiology Consultation:   Patient ID: Courtney Gray MRN: [id number]; DOB: [birth year]  Admit date: 04/22/2021 Date of Consult: 04/23/2021  PCP:  [facility]   [facility] Providers Cardiologist: New to [facility] - Dr. Harl Bowie  Patient Profile:   Courtney Gray is a 51 y.o. female with a hx of HTN, HLD, Type 2 DM, ESRD (on HD - T/TH/SAT schedule) and chronic hypoxic respiratory failure (on 3L [place] at baseline) who is being seen 04/23/2021 for the evaluation of chest pain at the request of Dr. Josephine Cables.  History of Present Illness:   Courtney Gray presented to [facility] ED during the evening hours of 04/22/2021 for evaluation of chest pain which had been occurring intermittently throughout the day. In talking with the patient today, she reports initially having stomach discomfort on Sunday evening and took some Gas-X with improvement in symptoms. The following morning, she reports having pressure along her chest and tried taking Gas-X along with fiber powder as she thought her symptoms were possibly due to a GI etiology but her pain persisted. She did go to the restroom and reports straining for several hours but feels like her symptoms might have improved during that time. She had recurrent pain last night which prompted her to come to the ED. She did take SL NTG x1 at home and says this did help with her symptoms. Denies any recurrent pain since admission. She reports having baseline dyspnea on exertion and is on 3 to 4 L nasal cannula at baseline. 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 She uses a walker or cane for ambulation. She denies any specific orthopnea, PND or pitting edema. She is concerned that she is significantly above her dry weight as she reports her prior dry weight was 165 kg and she is now at 172 kg. She no longer produces urine.  She is unaware of any personal history of CAD or cardiac arrhythmias. Reports she was told she had CHF in the past.   No known family history of cardiac issues.  Initial labs show WBC 12.5, Hgb 8.9 (previously 11.6 in 02/2021), platelets 197, Na+ 136, K+ 6.0 and creatinine 14.  Negative for COVID and influenza. Initial Hs Troponin 56 with repeat of 56 which is similar to prior values over the past year. CXR showed stable cardiomegaly without acute findings. EKG shows normal sinus rhythm, heart rate 68 with no acute ST changes when compared to prior tracings.  She did receive Lokelma while in the ED but a repeat BMET was not obtained this AM.    Past Medical History:  Diagnosis Date   Diabetes mellitus without complication ([place])    High cholesterol    Hypertension    Renal disorder     Past Surgical History:  Procedure Laterality Date   AV FISTULA PLACEMENT     x2, unsuccessful.    IR FLUORO GUIDE CV LINE LEFT  12/14/2020   IR FLUORO GUIDE CV LINE LEFT  02/20/2021   IR FLUORO GUIDE CV LINE RIGHT  08/28/2020   IR PERC TUN PERIT CATH WO PORT S&I /IMAG  09/06/2020   IR PTA VENOUS EXCEPT DIALYSIS CIRCUIT  02/20/2021   IR US GUIDE VASC ACCESS LEFT  09/06/2020   IR VENOCAVAGRAM SVC  02/20/2021     Home Medications:  Prior to Admission medications   Medication Sig Start Date End Date Taking? Authorizing Provider  acetaminophen (TYLENOL) 500 MG tablet Take 1,000 mg by mouth every 6 (six) hours  as needed for moderate pain or headache.   Yes [provider]  amLODipine (NORVASC) 10 MG tablet Take 1 tablet (10 mg total) by mouth daily. Patient taking differently: Take 10 mg by mouth at bedtime. 11/29/20  Yes Roxan Hockey, MD  aspirin EC 81 MG tablet Take 1 tablet (81 mg total) by mouth daily with breakfast. 11/28/20  Yes Emokpae, Courage, MD  atorvastatin (LIPITOR) 10 MG tablet Take 1 tablet (10 mg total) by mouth daily. Patient taking differently: Take 10 mg by mouth at bedtime. 11/28/20  Yes Emokpae, Courage, MD  calcium carbonate (TUMS SMOOTHIES) 750 MG chewable tablet Chew 750-1,500 mg by mouth See  admin instructions. Take 1500 mg with each meal and 750 mg with each snack   Yes [provider]  carvedilol (COREG) 25 MG tablet Take 1 tablet (25 mg total) by mouth 2 (two) times daily. Patient taking differently: Take 25 mg by mouth 2 (two) times daily. 12.5mg   twice a day on dialysis days Tuesday Thur. And Sat. 11/28/20  Yes Emokpae, Courage, MD  diphenhydrAMINE (BENADRYL) 25 MG tablet Take 25 mg by mouth daily as needed for allergies or sleep.   Yes [provider]  docusate sodium (COLACE) 100 MG capsule Take 100 mg by mouth 2 (two) times daily.   Yes [provider]  doxercalciferol (HECTOROL) 4 MCG/2ML injection Inject 2.5 mLs (5 mcg total) into the vein Every Tuesday,Thursday,and Saturday with dialysis. 09/01/20  Yes Enzo Bi, MD  gabapentin (NEURONTIN) 100 MG capsule Take 100-200 mg by mouth See admin instructions. Take 100 mg twice daily on non dialysis days.  Take 100 mg at 9am before dialysis. Take 100-200 mg at bedtime. May take a 100 mg in the evening as needed for pain. Tuesdays, Thursdays,and Saturdays. 04/20/19  Yes [provider]  glipiZIDE (GLUCOTROL XL) 2.5 MG 24 hr tablet Take 2.5 mg by mouth daily. 03/22/21  Yes [provider]  isosorbide mononitrate (IMDUR) 30 MG 24 hr tablet Take 1 tablet (30 mg total) by mouth daily. 11/28/20 11/28/21 Yes Emokpae, Courage, MD  lanthanum (FOSRENOL) 500 MG chewable tablet Chew 3 tablets by mouth 3 (three) times daily. 04/02/21  Yes [provider]  LANTUS SOLOSTAR 100 UNIT/ML Solostar Pen Inject 12 Units into the skin at bedtime. 03/18/19  Yes [provider]  lisinopril (ZESTRIL) 20 MG tablet Take 1 tablet (20 mg total) by mouth daily. 11/28/20  Yes Emokpae, Courage, MD  loperamide (IMODIUM A-D) 2 MG tablet Take 4 mg by mouth as needed for diarrhea or loose stools.   Yes [provider]  nitroGLYCERIN (NITROSTAT) 0.4 MG SL tablet Place 0.4 mg under the tongue every 5 (five)  minutes as needed for chest pain.   Yes [provider]  pantoprazole (PROTONIX) 40 MG tablet Take 1 tablet (40 mg total) by mouth daily. 11/29/20  Yes Emokpae, Courage, MD  Phenol (CHLORASEPTIC MT) Use as directed 4 sprays in the mouth or throat 4 (four) times daily as needed (throat pain).   Yes [provider]  sodium chloride (OCEAN) 0.65 % SOLN nasal spray Place 1 spray into both nostrils 2 (two) times daily as needed for congestion.   Yes [provider]  SPS 15 GM/60ML suspension Take 60 mLs by mouth See admin instructions. Weekly as needed to lower potassium 02/07/21  Yes [provider]  tiZANidine (ZANAFLEX) 4 MG tablet Take 4 mg by mouth every 8 (eight) hours. Take with gabapentin at same time 04/20/19  Yes [provider]    Inpatient Medications: Scheduled Meds:  amLODipine  10 mg Oral Daily   aspirin EC  81 mg Oral Daily   atorvastatin  10 mg Oral Daily   carvedilol  25 mg Oral BID   doxercalciferol  5 mcg Intravenous Q T,Th,Sa-HD   gabapentin  100 mg Oral Once per day on Tue Thu Sat   [START ON 04/24/2021] gabapentin  100 mg Oral 2 times per day on Sun Mon Wed Fri   heparin  5,000 Units Subcutaneous Q8H   insulin aspart  0-5 Units Subcutaneous QHS   insulin aspart  0-6 Units Subcutaneous TID WC   insulin aspart  2 Units Subcutaneous TID WC   insulin glargine-yfgn  6 Units Subcutaneous QHS   lisinopril  20 mg Oral Daily   pantoprazole  40 mg Oral Daily   tiZANidine  4 mg Oral Q8H   Continuous Infusions:  PRN Meds: nitroGLYCERIN  Allergies:    Allergies  Allergen Reactions   Benzonatate Other (See Comments)    Hallucinations   Hydralazine Other (See Comments)    Hallucinations   Amoxicillin Nausea And Vomiting   Clonidine Other (See Comments)    Altered mental state   Morphine Itching   Oxycodone Itching    Social History:   Social History   Socioeconomic History   Marital status: Single    Spouse name: Not on  file   Number of children: Not on file   Years of education: Not on file   Highest education level: Not on file  Occupational History   Not on file  Tobacco Use   Smoking status: Never   Smokeless tobacco: Never  Vaping Use   Vaping Use: Never used  Substance and Sexual Activity   Alcohol use: Never   Drug use: Never   Sexual activity: Not on file  Other Topics Concern   Not on file  Social History Narrative   Not on file   Social Determinants of Health   Financial Resource Strain: Not on file  Food Insecurity: Not on file  Transportation Needs: Not on file  Physical Activity: Not on file  Stress: Not on file  Social Connections: Not on file  Intimate Partner Violence: Not on file    Family History:    Family History  Problem Relation Age of Onset   Diabetes Mother    Diabetes Father      ROS:  Please see the history of present illness.   All other ROS reviewed and negative.     Physical Exam/Data:   Vitals:   04/23/21 0130 04/23/21 0321 04/23/21 0700 04/23/21 0757  BP: (!) 154/67 (!) 158/63 (!) 165/93 (!) 165/93  Pulse: 72 66 68 68  Resp: 15 (!) 23 18 18   Temp:   98 F (36.7 C)   TempSrc:    Oral  SpO2: 99% 100% 100%   Weight:   (!) 172.5 kg (!) 172.5 kg  Height:   5\' 5"  (1.651 m) 5\' 5"  (1.651 m)   No intake or output data in the 24 hours ending 04/23/21 0846 Last 3 Weights 04/23/2021 04/23/2021 04/22/2021  Weight (lbs) 380 lb 4.7 oz 380 lb 4.7 oz 360 lb  Weight (kg) 172.5 kg 172.5 kg 163.295 kg     Body mass index is 63.28 kg/m.  General: Pleasant, morbidly obese female appearing in no acute distress.  HEENT: normal Neck: JVD difficult to assess secondary to body habitus.  Vascular: No carotid  bruits; Distal pulses 2+ bilaterally Cardiac:  normal S1, S2; RRR; no murmur.  Lungs:  clear to auscultation bilaterally, no wheezing, rhonchi or rales  Abd: soft, nontender, no hepatomegaly  Ext: trace ankle edema bilaterally.  Musculoskeletal:  No  deformities, BUE and BLE strength normal and equal Skin: warm and dry  Neuro:  CNs 2-12 intact, no focal abnormalities noted Psych:  Normal affect   EKG:  The EKG was personally reviewed and demonstrates: normal sinus rhythm, heart rate 68 with no acute ST changes when compared to prior tracings.  Telemetry:  Telemetry was personally reviewed and demonstrates:  NSR, HR in 60's to 70's.   Relevant CV Studies:  Echocardiogram: 11/27/2020 IMPRESSIONS     1. Left ventricular ejection fraction, by estimation, is 55 to 60%. The  left ventricle has normal function. The left ventricle has no regional  wall motion abnormalities. There is moderate left ventricular hypertrophy.  Left ventricular diastolic  parameters are indeterminate. Elevated left atrial pressure.   2. Right ventricular systolic function is normal. The right ventricular  size is normal. Tricuspid regurgitation signal is inadequate for assessing  PA pressure.   3. The mitral valve is abnormal. Mild mitral valve regurgitation. No  evidence of mitral stenosis.   4. The aortic valve is tricuspid. There is mild calcification of the  aortic valve. There is mild thickening of the aortic valve. Aortic valve  regurgitation is not visualized. No aortic stenosis is present.   Laboratory Data:  High Sensitivity Troponin:   Recent Labs  Lab 04/22/21 0211 04/23/21 0453  TROPONINIHS 56* 56*     Chemistry Recent Labs  Lab 04/22/21 0211 04/23/21 0900  NA 136  --   K 6.0*  --   CL 102  --   CO2 20*  --   GLUCOSE 162*  --   BUN 87*  --   CREATININE 14.09*  --   CALCIUM 8.5*  --   MG  --  2.4  GFRNONAA 3*  --   ANIONGAP 14  --     No results for input(s): PROT, ALBUMIN, AST, ALT, ALKPHOS, BILITOT in the last 168 hours. Lipids No results for input(s): CHOL, TRIG, HDL, LABVLDL, LDLCALC, CHOLHDL in the last 168 hours.  Hematology Recent Labs  Lab 04/22/21 0211  WBC 12.5*  RBC 2.92*  HGB 8.9*  HCT 29.7*  MCV 101.7*   MCH 30.5  MCHC 30.0  RDW 14.2  PLT 197   Thyroid No results for input(s): TSH, FREET4 in the last 168 hours.  BNPNo results for input(s): BNP, PROBNP in the last 168 hours.  DDimer No results for input(s): DDIMER in the last 168 hours.   Radiology/Studies:  DG Chest 2 View  Result Date: 04/22/2021 CLINICAL DATA:  Chest pain. EXAM: CHEST - 2 VIEW COMPARISON:  None. FINDINGS: There is stable left internal jugular venous catheter positioning. There is mild to moderate severity enlargement of the cardiac silhouette which is unchanged in severity. Both lungs are clear. The visualized skeletal structures are unremarkable. IMPRESSION: Stable cardiomegaly without evidence of acute or active cardiopulmonary disease. Electronically Signed   By: Virgina Norfolk M.D.   On: 04/22/2021 21:56   DG Foot 2 Views Right  Result Date: 04/23/2021 CLINICAL DATA:  Evaluate for fracture. Complains of pain in the first and second phalanx of the right foot. EXAM: RIGHT FOOT - 2 VIEW COMPARISON:  None. FINDINGS: The bones appear diffusely osteopenic. There is diffuse soft tissue swelling identified. Extensive vascular  calcifications are identified. Hallux valgus deformity. Second through fifth hammertoe deformities. No signs of acute fracture or dislocation. IMPRESSION: 1. No acute bone abnormality. 2. Osteopenia. 3. Soft tissue swelling. Electronically Signed   By: Kerby Moors M.D.   On: 04/23/2021 05:28     Assessment and Plan:   1. Chest Pain with Mixed Features/Dyspnea on Exertion - She reports 2 main episodes of chest pain yesterday with the first lasting for over an hour but the second lasting for 15 to 20 minutes and resolving with nitroglycerin. She does have baseline dyspnea on exertion which is likely multifactorial in the setting of her chronic hypoxic respiratory failure, likely OSA, anemia, body habitus and deconditioning. - Hs Troponin values have been flat at 56 and her EKG is without acute ST  changes.  Will plan to obtain a limited echocardiogram for reassessment of her EF and wall motion. She did consume breakfast this morning but is overall not an ideal candidate for noninvasive ischemic evaluation given her body habitus with BMI of 63. If her echo shows change from prior imaging in 11/2020, she may require a cardiac catheterization for definitive evaluation. If echocardiogram is reassuring, would likely focus on medical management initially and can titrate Imdur (listed as being on 30mg  daily PTA but not yet ordered). She is already on ASA 81 mg daily, Coreg 25 mg twice daily and Lipitor 10 mg daily.  2. HTN - Her blood pressure is elevated at 165/93 on most recent check but she has not yet received her morning medications. Currently on Amlodipine 10mg  daily, Coreg 25mg  BID, and Lisinopril 20mg  daily. Will defer continuation of Lisinopril to Nephrology in the setting of her elevated K+. Will order her PTA Imdur.   3. HLD - She has been continued on PTA Atorvastatin 10mg  daily.   4. ESRD/Hyperkalemia - Nephrology has been consulted by the admitting team. Her K+ was at 6.0 on admission and she received Lokelma. Will recheck K+ this AM. Due for HD later today.   5. Chronic Hypoxic Respiratory Failure - She in on 3-4L [place] at baseline.   6. Anemia - Likely in the setting of chronic disease. Hgb was at 11.6 in 02/2021. At 8.9 this admission and repeat labs pending. Further workup per the admitting team.    Risk Assessment/Risk Scores:     HEAR Score (for undifferentiated chest pain):  HEAR Score: 4   For questions or updates, please contact [place] Please consult [email or web address] for contact info under    Signed, Erma Heritage, PA-C  04/23/2021 8:46 AM  Attending note Patient seen and discussed with PA Ahmed Prima, I agree with her documentation. 51 yo female history of HTN, HL, DM2, ESRD, chronic respiratory failure on 3L  at home presents with chest pain. Reports  initially stomach pains and constipation yesterday. Later in the day epigastric and midchest pain/pressure 10/10, lasted about 20 minutes. She thought was gas. Recurrent episode later in the day similar in character, had some improvement with SL NG   K 6 BUN 87 Cr 14 WBC 12.5 Hgb 8.9 Trop 56-->56--> COVID neg flu neg CXR no acute process EKG NSR, no ischemic changes  11/2020 echo: LVEF 19-14%, indet diastolic fxn, normal RV, mild MR   1.Chest pain - EKG without ischemic changes, chronic mild trop elevation in setting of ESRD that is flat and not consistent with ACS - BMI 63 would make noninvasive stress testing insufficent. Essentially if the pretest probability was high enough would have to  consider cath. At this time no convincing objective evidence of ACS. Will f/u limited echo, if no significant finding would not pursue further cardiac testing and would increase imdur to 60mg  and monitor symptoms   2.Anemia - Hgb 8.9 on admission, down from 11.6 2 months ago - per primary team  3. ESRD - per nephrology - she reports she is above her dry weihgt, unclear if could be playing a role in her symptoms. Has not missed any HD sessions - nephrology to see, monitor symptoms after HD.    Addendum 1130AM Limited echo shows normal LVEF. No plans for additional cardiac testing, we did increase her imdur to 60mg  daily. Monitor how she feels after HD today, we will sign off inpatient care.    Carlyle Dolly MD

## 2021-04-23 NOTE — Discharge Instructions (Signed)
1)INcrease isosorbide/Imdur to 60 mg daily-- 2) please follow-up with PCP at [facility], [facility] -as previously advised 3) please go for hemodialysis on Thursday, 25 April 2021

## 2021-04-23 NOTE — Consult Note (Signed)
[facility] Renal Consultation Note    Indication for Consultation:  Management of ESRD/hemodialysis; anemia, hypertension/volume and secondary hyperparathyroidism  HPI: Courtney Gray is a 51 y.o. female with a PMH significant for DM, HTN, morbid obesity, and ESRD on HD TTS at [facility] who presented to [facility] ED with substernal chest pain that woke her from sleep on the morning of 04/23/21.  CP was relieved with NTG.  In the ED she was hemodynamically stable.  CXR with stable cardiomegaly without acute cardiopulmonary disease.  Pt was admitted for observation and we were consulted to provide HD during her hospitalization.    She is currently without chest pain.  Past Medical History:  Diagnosis Date   Diabetes mellitus without complication (HCC)    High cholesterol    Hypertension    Renal disorder    Past Surgical History:  Procedure Laterality Date   AV FISTULA PLACEMENT     x2, unsuccessful.    IR FLUORO GUIDE CV LINE LEFT  12/14/2020   IR FLUORO GUIDE CV LINE LEFT  02/20/2021   IR FLUORO GUIDE CV LINE RIGHT  08/28/2020   IR PERC TUN PERIT CATH WO PORT S&I /IMAG  09/06/2020   IR PTA VENOUS EXCEPT DIALYSIS CIRCUIT  02/20/2021   IR US GUIDE VASC ACCESS LEFT  09/06/2020   IR VENOCAVAGRAM SVC  02/20/2021   Family History:   Family History  Problem Relation Age of Onset   Diabetes Mother    Diabetes Father    Social History:  reports that she has never smoked. She has never used smokeless tobacco. She reports that she does not drink alcohol and does not use drugs. Allergies  Allergen Reactions   Benzonatate Other (See Comments)    Hallucinations   Hydralazine Other (See Comments)    Hallucinations   Amoxicillin Nausea And Vomiting   Clonidine Other (See Comments)    Altered mental state   Morphine Itching   Oxycodone Itching   Prior to Admission medications   Medication Sig Start Date End Date Taking? Authorizing Provider  acetaminophen (TYLENOL) 500 MG tablet  Take 1,000 mg by mouth every 6 (six) hours as needed for moderate pain or headache.   Yes [provider]  amLODipine (NORVASC) 10 MG tablet Take 1 tablet (10 mg total) by mouth daily. Patient taking differently: Take 10 mg by mouth at bedtime. 11/29/20  Yes Roxan Hockey, MD  aspirin EC 81 MG tablet Take 1 tablet (81 mg total) by mouth daily with breakfast. 11/28/20  Yes Emokpae, Courage, MD  atorvastatin (LIPITOR) 10 MG tablet Take 1 tablet (10 mg total) by mouth daily. Patient taking differently: Take 10 mg by mouth at bedtime. 11/28/20  Yes Emokpae, Courage, MD  calcium carbonate (TUMS SMOOTHIES) 750 MG chewable tablet Chew 750-1,500 mg by mouth See admin instructions. Take 1500 mg with each meal and 750 mg with each snack   Yes [provider]  carvedilol (COREG) 25 MG tablet Take 1 tablet (25 mg total) by mouth 2 (two) times daily. Patient taking differently: Take 25 mg by mouth 2 (two) times daily. 12.5mg   twice a day on dialysis days Tuesday Thur. And Sat. 11/28/20  Yes Emokpae, Courage, MD  diphenhydrAMINE (BENADRYL) 25 MG tablet Take 25 mg by mouth daily as needed for allergies or sleep.   Yes [provider]  docusate sodium (COLACE) 100 MG capsule Take 100 mg by mouth 2 (two) times daily.   Yes [provider]  doxercalciferol (  HECTOROL) 4 MCG/2ML injection Inject 2.5 mLs (5 mcg total) into the vein Every Tuesday,Thursday,and Saturday with dialysis. 09/01/20  Yes Enzo Bi, MD  gabapentin (NEURONTIN) 100 MG capsule Take 100-200 mg by mouth See admin instructions. Take 100 mg twice daily on non dialysis days.  Take 100 mg at 9am before dialysis. Take 100-200 mg at bedtime. May take a 100 mg in the evening as needed for pain. Tuesdays, Thursdays,and Saturdays. 04/20/19  Yes [provider]  glipiZIDE (GLUCOTROL XL) 2.5 MG 24 hr tablet Take 2.5 mg by mouth daily. 03/22/21  Yes [provider]  isosorbide mononitrate (IMDUR) 30 MG 24 hr tablet  Take 1 tablet (30 mg total) by mouth daily. 11/28/20 11/28/21 Yes Emokpae, Courage, MD  lanthanum (FOSRENOL) 500 MG chewable tablet Chew 3 tablets by mouth 3 (three) times daily. 04/02/21  Yes [provider]  LANTUS SOLOSTAR 100 UNIT/ML Solostar Pen Inject 12 Units into the skin at bedtime. 03/18/19  Yes [provider]  lisinopril (ZESTRIL) 20 MG tablet Take 1 tablet (20 mg total) by mouth daily. 11/28/20  Yes Emokpae, Courage, MD  loperamide (IMODIUM A-D) 2 MG tablet Take 4 mg by mouth as needed for diarrhea or loose stools.   Yes [provider]  nitroGLYCERIN (NITROSTAT) 0.4 MG SL tablet Place 0.4 mg under the tongue every 5 (five) minutes as needed for chest pain.   Yes [provider]  pantoprazole (PROTONIX) 40 MG tablet Take 1 tablet (40 mg total) by mouth daily. 11/29/20  Yes Emokpae, Courage, MD  Phenol (CHLORASEPTIC MT) Use as directed 4 sprays in the mouth or throat 4 (four) times daily as needed (throat pain).   Yes [provider]  sodium chloride (OCEAN) 0.65 % SOLN nasal spray Place 1 spray into both nostrils 2 (two) times daily as needed for congestion.   Yes [provider]  SPS 15 GM/60ML suspension Take 60 mLs by mouth See admin instructions. Weekly as needed to lower potassium 02/07/21  Yes [provider]  tiZANidine (ZANAFLEX) 4 MG tablet Take 4 mg by mouth every 8 (eight) hours. Take with gabapentin at same time 04/20/19  Yes [provider]   Current Facility-Administered Medications  Medication Dose Route Frequency Provider Last Rate Last Admin   amLODipine (NORVASC) tablet 10 mg  10 mg Oral Daily Adefeso, Oladapo, DO   10 mg at 04/23/21 5176   aspirin EC tablet 81 mg  81 mg Oral Daily Adefeso, Oladapo, DO   81 mg at 04/23/21 0942   atorvastatin (LIPITOR) tablet 10 mg  10 mg Oral Daily Adefeso, Oladapo, DO   10 mg at 04/23/21 0942   carvedilol (COREG) tablet 25 mg  25 mg Oral BID Adefeso, Oladapo, DO   25 mg  at 04/23/21 0941   Chlorhexidine Gluconate Cloth 2 % PADS 6 each  6 each Topical Q0600 Donato Heinz, MD       doxercalciferol (HECTOROL) injection 5 mcg  5 mcg Intravenous Q T,Th,Sa-HD Adefeso, Oladapo, DO       gabapentin (NEURONTIN) capsule 100 mg  100 mg Oral Once per day on Tue Thu Sat Adefeso, Oladapo, DO   100 mg at 04/23/21 0944   [START ON 04/24/2021] gabapentin (NEURONTIN) capsule 100 mg  100 mg Oral 2 times per day on Sun Mon Wed Fri Roxan Hockey, MD       heparin injection 5,000 Units  5,000 Units Subcutaneous Q8H Adefeso, Oladapo, DO   5,000 Units at 04/23/21 [phone]  insulin aspart (novoLOG) injection 0-5 Units  0-5 Units Subcutaneous QHS Adefeso, Oladapo, DO       insulin aspart (novoLOG) injection 0-6 Units  0-6 Units Subcutaneous TID WC Adefeso, Oladapo, DO   1 Units at 04/23/21 0944   insulin aspart (novoLOG) injection 2 Units  2 Units Subcutaneous TID WC Adefeso, Oladapo, DO   2 Units at 04/23/21 0943   insulin glargine-yfgn (SEMGLEE) injection 6 Units  6 Units Subcutaneous QHS Adefeso, Oladapo, DO       [START ON 04/24/2021] isosorbide mononitrate (IMDUR) 24 hr tablet 60 mg  60 mg Oral Daily Branch, Alphonse Guild, MD       lisinopril (ZESTRIL) tablet 20 mg  20 mg Oral Daily Adefeso, Oladapo, DO   20 mg at 04/23/21 0942   nitroGLYCERIN (NITROSTAT) SL tablet 0.4 mg  0.4 mg Sublingual Q5 min PRN Adefeso, Oladapo, DO       pantoprazole (PROTONIX) EC tablet 40 mg  40 mg Oral Daily Adefeso, Oladapo, DO   40 mg at 04/23/21 0942   tiZANidine (ZANAFLEX) tablet 4 mg  4 mg Oral Q8H Adefeso, Oladapo, DO   4 mg at 04/23/21 0810   Labs: Basic Metabolic Panel: Recent Labs  Lab 04/22/21 0211 04/23/21 0900  NA 136  --   K 6.0*  --   CL 102  --   CO2 20*  --   GLUCOSE 162*  --   BUN 87*  --   CREATININE 14.09*  --   CALCIUM 8.5*  --   PHOS  --  9.6*   Liver Function Tests: No results for input(s): AST, ALT, ALKPHOS, BILITOT, PROT, ALBUMIN in the last 168 hours. No results for  input(s): LIPASE, AMYLASE in the last 168 hours. No results for input(s): AMMONIA in the last 168 hours. CBC: Recent Labs  Lab 04/22/21 0211  WBC 12.5*  HGB 8.9*  HCT 29.7*  MCV 101.7*  PLT 197   Cardiac Enzymes: No results for input(s): CKTOTAL, CKMB, CKMBINDEX, TROPONINI in the last 168 hours. CBG: Recent Labs  Lab 04/23/21 0902  GLUCAP 199*   Iron Studies: No results for input(s): IRON, TIBC, TRANSFERRIN, FERRITIN in the last 72 hours. Studies/Results: DG Chest 2 View  Result Date: 04/22/2021 CLINICAL DATA:  Chest pain. EXAM: CHEST - 2 VIEW COMPARISON:  None. FINDINGS: There is stable left internal jugular venous catheter positioning. There is mild to moderate severity enlargement of the cardiac silhouette which is unchanged in severity. Both lungs are clear. The visualized skeletal structures are unremarkable. IMPRESSION: Stable cardiomegaly without evidence of acute or active cardiopulmonary disease. Electronically Signed   By: Virgina Norfolk M.D.   On: 04/22/2021 21:56   DG Foot 2 Views Right  Result Date: 04/23/2021 CLINICAL DATA:  Evaluate for fracture. Complains of pain in the first and second phalanx of the right foot. EXAM: RIGHT FOOT - 2 VIEW COMPARISON:  None. FINDINGS: The bones appear diffusely osteopenic. There is diffuse soft tissue swelling identified. Extensive vascular calcifications are identified. Hallux valgus deformity. Second through fifth hammertoe deformities. No signs of acute fracture or dislocation. IMPRESSION: 1. No acute bone abnormality. 2. Osteopenia. 3. Soft tissue swelling. Electronically Signed   By: Kerby Moors M.D.   On: 04/23/2021 05:28    ROS: Pertinent items are noted in HPI. Physical Exam: Vitals:   04/23/21 0130 04/23/21 0321 04/23/21 0700 04/23/21 0757  BP: (!) 154/67 (!) 158/63 (!) 165/93 (!) 165/93  Pulse: 72 66 68 68  Resp: 15 (!)  23 18 18   Temp:   98 F (36.7 C)   TempSrc:    Oral  SpO2: 99% 100% 100%   Weight:   (!)  172.5 kg (!) 172.5 kg  Height:   5\' 5"  (1.651 m) 5\' 5"  (1.651 m)      Weight change:  No intake or output data in the 24 hours ending 04/23/21 1039 BP (!) 165/93    Pulse 68    Temp 98 F (36.7 C)    Resp 18    Ht 5\' 5"  (1.651 m)    Wt (!) 172.5 kg    SpO2 100%    BMI 63.28 kg/m  General appearance: fatigued, no distress, and morbidly obese Head: Normocephalic, without obvious abnormality, atraumatic Resp: clear to auscultation bilaterally Cardio: regular rate and rhythm, S1, S2 normal, no murmur, click, rub or gallop GI: soft, non-tender; bowel sounds normal; no masses,  no organomegaly Extremities: extremities normal, atraumatic, no cyanosis or edema and RUE AVF without thrill, + bruit, pulsatile Dialysis Access:  Dialyzes at [facility]-  TTS - 4 hours  EDW 164.5kg. HD Bath 1K/2.5 calc, Dialyzer rev300, Heparin load 2600- then 600 per hour. Access [facility] (refuses other ) -  400 BFR but refuses that high.  Epo 7600, calcitriol 1 mcg, sensipar 30 q treatment   Assessment/Plan:  Chest pain - Cardiology following.  Troponin I flat at 56.  For ECHO today per Cardiology.  ESRD -  plan for HD today.  Hypertension/volume  - Pt is 8 kg above edw.  Will try to uf 4-5 liters  Anemia  - stable  Metabolic bone disease -  continue with home meds  Nutrition - renal diet Hyperkalemia - plan for HD with 1K bath (her usual outpatient prescription).   Donetta Potts, MD [place] Pager [phone] 04/23/2021, 10:39 AM

## 2021-04-23 NOTE — ED Notes (Signed)
This nurse unable to start IV, attempted twice on left side. Lab also was unable to obtain labs due to pt wanting ultrasound and not wanting to be stuck multiple times.

## 2021-04-23 NOTE — Progress Notes (Incomplete)
*  PRELIMINARY RESULTS* Echocardiogram Limited 2D Echocardiogram has been performed.  Courtney Gray 04/23/2021, 11:06 AM

## 2021-04-23 NOTE — H&P (Signed)
History and Physical  Courtney Gray:[id number] DOB: January 25, 1971 DOA: 04/22/2021  Referring physician: Merrily Pew, MD PCP: Medicine, [facility] Internal  Patient coming from: Home  Chief Complaint: Chest pain  HPI: Courtney Gray is a 51 y.o. 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  Pain was described as pressure, "like an elephant sitting on my chest ".  Patient states that the chest pain also have a reproducible component.  Chest pain was rated as 10/10 on pain scale, it was nonradiating and self resolved after about 30 minutes there was no diaphoresis.  Chest pain occurred again in the evening, she took nitroglycerin and this alleviated the chest pain she decided to go to the ED for further evaluation and management.  Patient ambulates with a cane at baseline  ED Course:  In the emergency department, she was hemodynamically stable.  BP was 159/69.  Work-up in the ED showed hyperkalemia, hyperglycemia, BUN/creatinine 87/14.09 with an eGFR of 3.  CBC showed leukocytosis, macrocytic anemia.  Troponin x1 was 56. Chest x-ray showed stable cardiomegaly without evidence of acute or active cardiopulmonary disease Aspirin 324 mg x 1 was given, Lokelma was given.  Hospitalist was asked to admit patient for further evaluation and management.  Review of Systems: A full 10 point Review of Systems was done, except as stated above, all other Review of systems were negative.  Past Medical History:  Diagnosis Date   Diabetes mellitus without complication (HCC)    High cholesterol    Hypertension    Renal disorder     Past Surgical History:  Procedure Laterality Date   AV FISTULA PLACEMENT     x2, unsuccessful.    IR FLUORO GUIDE CV LINE LEFT  12/14/2020   IR FLUORO GUIDE CV LINE LEFT  02/20/2021   IR FLUORO GUIDE CV LINE RIGHT  08/28/2020   IR PERC TUN PERIT CATH WO PORT S&I /IMAG  09/06/2020   IR PTA VENOUS EXCEPT DIALYSIS CIRCUIT  02/20/2021   IR US GUIDE VASC ACCESS LEFT  09/06/2020   IR VENOCAVAGRAM SVC  02/20/2021    Social History:  reports that she has never smoked. She has never used smokeless tobacco. She reports that she does not drink alcohol and does not use drugs.   Allergies  Allergen Reactions   Benzonatate Other (See Comments)    Hallucinations   Hydralazine Other (See Comments)    Hallucinations   Amoxicillin Nausea And Vomiting   Clonidine Other (See Comments)    Altered mental state   Morphine Itching   Oxycodone Itching    Family History  Problem Relation Age of Onset   Diabetes Mother    Diabetes Father     Prior to Admission medications   Medication Sig Start Date End Date Taking? Authorizing Provider  acetaminophen (TYLENOL) 500 MG tablet Take 1,000 mg by mouth every 6 (six) hours as needed for moderate pain or headache.    [provider]  amLODipine (NORVASC) 10 MG tablet Take 1 tablet (10 mg total) by mouth daily. Patient taking differently: Take 10 mg by mouth at bedtime. 11/29/20   Roxan Hockey, MD  aspirin EC 81 MG tablet Take 1 tablet (81  mg total) by mouth daily with breakfast. 11/28/20   Roxan Hockey, MD  atorvastatin (LIPITOR) 10 MG tablet Take 1 tablet (10 mg total) by mouth daily. Patient taking differently: Take 10 mg by mouth at bedtime. 11/28/20   Roxan Hockey, MD  calcium carbonate (TUMS SMOOTHIES) 750 MG chewable tablet Chew 750-1,500 mg by mouth See admin instructions. Take 1500 mg with each meal and 750 mg with each snack    [provider]  carvedilol (COREG) 25 MG tablet Take 1 tablet (25 mg total) by mouth 2 (two)  times daily. 11/28/20   Roxan Hockey, MD  diphenhydrAMINE (BENADRYL) 25 MG tablet Take 25 mg by mouth daily as needed for allergies or sleep.    [provider]  docusate sodium (COLACE) 100 MG capsule Take 100 mg by mouth 2 (two) times daily.    [provider]  doxercalciferol (HECTOROL) 4 MCG/2ML injection Inject 2.5 mLs (5 mcg total) into the vein Every Tuesday,Thursday,and Saturday with dialysis. 09/01/20   Enzo Bi, MD  gabapentin (NEURONTIN) 100 MG capsule Take 100-200 mg by mouth See admin instructions. Take 100 mg twice daily on non dialysis days.  Take 100 mg at 9am before dialysis. Take 100-200 mg at bedtime. May take a 100 mg in the evening as needed for pain. Tuesdays, Thursdays,and Saturdays. 04/20/19   [provider]  glipiZIDE (GLUCOTROL) 5 MG tablet Take 5 mg by mouth daily.    [provider]  isosorbide mononitrate (IMDUR) 30 MG 24 hr tablet Take 1 tablet (30 mg total) by mouth daily. 11/28/20 11/28/21  Roxan Hockey, MD  LANTUS SOLOSTAR 100 UNIT/ML Solostar Pen Inject 13 Units into the skin at bedtime. 03/18/19   [provider]  lisinopril (ZESTRIL) 20 MG tablet Take 1 tablet (20 mg total) by mouth daily. 11/28/20   Roxan Hockey, MD  loperamide (IMODIUM A-D) 2 MG tablet Take 4 mg by mouth as needed for diarrhea or loose stools.    [provider]  nitroGLYCERIN (NITROSTAT) 0.4 MG SL tablet Place 0.4 mg under the tongue every 5 (five) minutes as needed for chest pain.    [provider]  pantoprazole (PROTONIX) 40 MG tablet Take 1 tablet (40 mg total) by mouth daily. 11/29/20   Roxan Hockey, MD  Phenol (CHLORASEPTIC MT) Use as directed 4 sprays in the mouth or throat 4 (four) times daily as needed (throat pain).    [provider]  simethicone (MYLICON) 700 MG chewable tablet Chew 125 mg by mouth 2 (two) times daily.    [provider]  sodium chloride (OCEAN) 0.65 % SOLN nasal spray Place 1  spray into both nostrils 2 (two) times daily as needed for congestion.    [provider]  SPS 15 GM/60ML suspension Take 60 mLs by mouth See admin instructions. Weekly as needed to lower potassium 02/07/21   [provider]  tiZANidine (ZANAFLEX) 4 MG tablet Take 4 mg by mouth every 8 (eight) hours.  04/20/19   [provider]    Physical Exam: BP (!) 158/63    Pulse 66    Temp 97.9 F (36.6 C)    Resp (!) 23    Ht 5' 5"  (1.651 m)    Wt (!) 163.3 kg    SpO2 100%    BMI 59.91 kg/m   General: 51 y.o. year-old female well developed well nourished in no acute distress.  Alert and oriented x3. HEENT: NCAT, EOMI Neck: Supple, trachea medial Cardiovascular: Regular  rate and rhythm with no rubs or gallops.  No thyromegaly or JVD noted.  No lower extremity edema. 2/4 pulses in all 4 extremities. Respiratory: Clear to auscultation with no wheezes or rales. Good inspiratory effort. Abdomen: Soft, nontender nondistended with normal bowel sounds x4 quadrants. Muskuloskeletal: No cyanosis, clubbing or edema noted bilaterally Neuro: CN II-XII intact, strength 5/5 x 4, sensation, reflexes intact Skin: No ulcerative lesions noted or rashes Psychiatry: Judgement and insight appear normal. Mood is appropriate for condition and setting          Labs on Admission:  Basic Metabolic Panel: Recent Labs  Lab 04/22/21 0211  NA 136  K 6.0*  CL 102  CO2 20*  GLUCOSE 162*  BUN 87*  CREATININE 14.09*  CALCIUM 8.5*   Liver Function Tests: No results for input(s): AST, ALT, ALKPHOS, BILITOT, PROT, ALBUMIN in the last 168 hours. No results for input(s): LIPASE, AMYLASE in the last 168 hours. No results for input(s): AMMONIA in the last 168 hours. CBC: Recent Labs  Lab 04/22/21 0211  WBC 12.5*  HGB 8.9*  HCT 29.7*  MCV 101.7*  PLT 197   Cardiac Enzymes: No results for input(s): CKTOTAL, CKMB, CKMBINDEX, TROPONINI in the last 168 hours.  BNP (last 3 results) No results  for input(s): BNP in the last 8760 hours.  ProBNP (last 3 results) No results for input(s): PROBNP in the last 8760 hours.  CBG: No results for input(s): GLUCAP in the last 168 hours.  Radiological Exams on Admission: DG Chest 2 View  Result Date: 04/22/2021 CLINICAL DATA:  Chest pain. EXAM: CHEST - 2 VIEW COMPARISON:  None. FINDINGS: There is stable left internal jugular venous catheter positioning. There is mild to moderate severity enlargement of the cardiac silhouette which is unchanged in severity. Both lungs are clear. The visualized skeletal structures are unremarkable. IMPRESSION: Stable cardiomegaly without evidence of acute or active cardiopulmonary disease. Electronically Signed   By: Virgina Norfolk M.D.   On: 04/22/2021 21:56    EKG: I independently viewed the EKG done and my findings are as followed: Normal sinus rhythm at a rate of 68 bpm  Assessment/Plan Present on Admission:  Chest pain  Principal Problem:   Chest pain   Chest pain rule out ACS Cardiovascular risk factors include hypertension, hyperlipidemia, obesity, T2DM Continue telemetry  Troponins x 1 - 56 (chronically elevated) EKG personally reviewed showed NSR at a rate of 68 continue bpm Cardiology will be consulted to help decide if Stress test is needed in am Versus other  diagnostic modalities.    Continue aspirin, nitroglycerin prn  Hyperglycemia secondary to T2DM Continue ISS and hypoglycemic protocol Continue Semglee 6 units nightly (home insulin dose at 13 units nightly)  Hyperkalemia K+ 6.0, no acute EKG changes, Lokelma was given This will be corrected during dialysis  Leukocytosis possibly reactive WBC 12.5, continue to monitor WBC with morning labs  Macrocytic anemia MCV 101.7; H/H 8.9/29.7 Folate and vitamin B12 levels will be checked  Chronic respiratory failure Continuous supplemental oxygen per home regimen  ESRD on HD (TThS) Last HD was on Saturday (1/14) Nephrology  consult was placed for maintenance dialysis today  GERD Continue Protonix  Essential hypertension Continue Norvasc, lisinopril, and Coreg  Mixed hyperlipidemia Continue Lipitor  Chronic neuropathic pain Continue the use of Neurontin and tizanidine.  Secondary hyperparathyroidism Continue Hectorol  Obesity (BMI 59.91 kg/) Patient was counseled on diet and lifestyle modification Patient will need to follow-up with outpatient PCP for weight loss program  DVT prophylaxis: Heparin subcu  Code Status: Full code  Family Communication: None at bedside  Disposition Plan:  Patient is from:                        home Anticipated DC to:                   SNF or family members home Anticipated DC date:               2-3 days Anticipated DC barriers:          Patient requires inpatient management due to chest pain requiring pending cardiology consult and hemodialysis pending nephrology consult  Consults called: Nephrology, cardiology  Admission status: Observation    Bernadette Hoit MD [facility]   04/23/2021, 4:16 AM

## 2021-04-23 NOTE — Procedures (Signed)
° ° °  HEMODIALYSIS TREATMENT NOTE:   After d/w Dr. Marval Regal and at pt's request, HD time was extended to 4:45 to attempt UF of 5L.  Pre-HD K 5.5 - low K dialysate utilized as per her outpatient prescription. Hgb 7.8 - Darbepoetin 92mcg given.  4.75 hour session completed.  4L removed.  UF was interrupted for 20 minutes for hypotension (asymptomatic).  Standing post weight 168.5kg (EDW 165 kg).    She states she now wishes to dc home.  Daughter arrived just as HD was ending and can provide transportation.  Fluid and dietary discretion were advised.   [name], RN

## 2021-04-23 NOTE — ED Notes (Signed)
Attempted ultrasound IV, was able to obtain blood but was unable to establish IV. Blood sent to lab.

## 2021-04-24 LAB — HEPATITIS B SURFACE ANTIBODY, QUANTITATIVE: Hep B S AB Quant (Post): <3.1 m[IU]/mL — ABNORMAL LOW (ref >9.9)

## 2021-04-25 DIAGNOSIS — N186 End stage renal disease: Secondary | ICD-10-CM | POA: Diagnosis not present

## 2021-04-25 DIAGNOSIS — Z992 Dependence on renal dialysis: Secondary | ICD-10-CM | POA: Diagnosis not present

## 2021-04-26 DIAGNOSIS — E1165 Type 2 diabetes mellitus with hyperglycemia: Secondary | ICD-10-CM | POA: Diagnosis not present

## 2021-04-26 DIAGNOSIS — Z992 Dependence on renal dialysis: Secondary | ICD-10-CM | POA: Diagnosis not present

## 2021-04-26 DIAGNOSIS — G629 Polyneuropathy, unspecified: Secondary | ICD-10-CM | POA: Diagnosis not present

## 2021-04-26 DIAGNOSIS — I700 Atherosclerosis of aorta: Secondary | ICD-10-CM | POA: Diagnosis not present

## 2021-04-26 DIAGNOSIS — N186 End stage renal disease: Secondary | ICD-10-CM | POA: Diagnosis not present

## 2021-04-26 DIAGNOSIS — Z299 Encounter for prophylactic measures, unspecified: Secondary | ICD-10-CM | POA: Diagnosis not present

## 2021-04-27 DIAGNOSIS — N186 End stage renal disease: Secondary | ICD-10-CM | POA: Diagnosis not present

## 2021-04-27 DIAGNOSIS — Z992 Dependence on renal dialysis: Secondary | ICD-10-CM | POA: Diagnosis not present

## 2021-04-29 DIAGNOSIS — E1165 Type 2 diabetes mellitus with hyperglycemia: Secondary | ICD-10-CM | POA: Diagnosis not present

## 2021-04-29 DIAGNOSIS — I10 Essential (primary) hypertension: Secondary | ICD-10-CM | POA: Diagnosis not present

## 2021-04-29 DIAGNOSIS — E1122 Type 2 diabetes mellitus with diabetic chronic kidney disease: Secondary | ICD-10-CM | POA: Diagnosis not present

## 2021-04-29 DIAGNOSIS — N186 End stage renal disease: Secondary | ICD-10-CM | POA: Diagnosis not present

## 2021-04-29 DIAGNOSIS — Z789 Other specified health status: Secondary | ICD-10-CM | POA: Diagnosis not present

## 2021-04-29 DIAGNOSIS — Z299 Encounter for prophylactic measures, unspecified: Secondary | ICD-10-CM | POA: Diagnosis not present

## 2021-04-30 DIAGNOSIS — Z992 Dependence on renal dialysis: Secondary | ICD-10-CM | POA: Diagnosis not present

## 2021-04-30 DIAGNOSIS — N186 End stage renal disease: Secondary | ICD-10-CM | POA: Diagnosis not present

## 2021-05-02 DIAGNOSIS — N186 End stage renal disease: Secondary | ICD-10-CM | POA: Diagnosis not present

## 2021-05-02 DIAGNOSIS — Z992 Dependence on renal dialysis: Secondary | ICD-10-CM | POA: Diagnosis not present

## 2021-05-04 DIAGNOSIS — Z992 Dependence on renal dialysis: Secondary | ICD-10-CM | POA: Diagnosis not present

## 2021-05-04 DIAGNOSIS — N186 End stage renal disease: Secondary | ICD-10-CM | POA: Diagnosis not present

## 2021-05-07 DIAGNOSIS — E1165 Type 2 diabetes mellitus with hyperglycemia: Secondary | ICD-10-CM | POA: Diagnosis not present

## 2021-05-07 DIAGNOSIS — E7800 Pure hypercholesterolemia, unspecified: Secondary | ICD-10-CM | POA: Diagnosis not present

## 2021-05-07 DIAGNOSIS — N186 End stage renal disease: Secondary | ICD-10-CM | POA: Diagnosis not present

## 2021-05-07 DIAGNOSIS — Z789 Other specified health status: Secondary | ICD-10-CM | POA: Diagnosis not present

## 2021-05-07 DIAGNOSIS — Z299 Encounter for prophylactic measures, unspecified: Secondary | ICD-10-CM | POA: Diagnosis not present

## 2021-05-07 DIAGNOSIS — I10 Essential (primary) hypertension: Secondary | ICD-10-CM | POA: Diagnosis not present

## 2021-05-07 DIAGNOSIS — Z992 Dependence on renal dialysis: Secondary | ICD-10-CM | POA: Diagnosis not present

## 2021-05-07 DIAGNOSIS — K3184 Gastroparesis: Secondary | ICD-10-CM | POA: Diagnosis not present

## 2021-05-09 ENCOUNTER — Other Ambulatory Visit (HOSPITAL_COMMUNITY): Payer: Self-pay | Admitting: Medical

## 2021-05-09 DIAGNOSIS — Z992 Dependence on renal dialysis: Secondary | ICD-10-CM | POA: Diagnosis not present

## 2021-05-09 DIAGNOSIS — N186 End stage renal disease: Secondary | ICD-10-CM

## 2021-05-11 DIAGNOSIS — I959 Hypotension, unspecified: Secondary | ICD-10-CM | POA: Diagnosis not present

## 2021-05-11 DIAGNOSIS — N186 End stage renal disease: Secondary | ICD-10-CM | POA: Diagnosis not present

## 2021-05-11 DIAGNOSIS — Z992 Dependence on renal dialysis: Secondary | ICD-10-CM | POA: Diagnosis not present

## 2021-05-13 ENCOUNTER — Other Ambulatory Visit (HOSPITAL_COMMUNITY): Payer: Self-pay | Admitting: Medical

## 2021-05-13 ENCOUNTER — Other Ambulatory Visit: Payer: Self-pay

## 2021-05-13 ENCOUNTER — Ambulatory Visit (HOSPITAL_COMMUNITY)
Admission: RE | Admit: 2021-05-13 | Discharge: 2021-05-13 | Disposition: A | Discharge status: Home / Self Care | Payer: Medicare Other | Source: Ambulatory Visit | Attending: Medical | Admitting: Medical

## 2021-05-13 DIAGNOSIS — R0602 Shortness of breath: Secondary | ICD-10-CM | POA: Diagnosis not present

## 2021-05-13 DIAGNOSIS — N186 End stage renal disease: Secondary | ICD-10-CM

## 2021-05-13 DIAGNOSIS — I2510 Atherosclerotic heart disease of native coronary artery without angina pectoris: Secondary | ICD-10-CM | POA: Diagnosis not present

## 2021-05-13 DIAGNOSIS — E875 Hyperkalemia: Secondary | ICD-10-CM | POA: Diagnosis not present

## 2021-05-13 DIAGNOSIS — T82510A Breakdown (mechanical) of surgically created arteriovenous fistula, initial encounter: Secondary | ICD-10-CM | POA: Diagnosis not present

## 2021-05-13 DIAGNOSIS — M79672 Pain in left foot: Secondary | ICD-10-CM | POA: Diagnosis not present

## 2021-05-13 DIAGNOSIS — Z992 Dependence on renal dialysis: Secondary | ICD-10-CM | POA: Insufficient documentation

## 2021-05-13 DIAGNOSIS — K648 Other hemorrhoids: Secondary | ICD-10-CM | POA: Diagnosis not present

## 2021-05-13 DIAGNOSIS — K594 Anal spasm: Secondary | ICD-10-CM | POA: Diagnosis not present

## 2021-05-13 DIAGNOSIS — Y841 Kidney dialysis as the cause of abnormal reaction of the patient, or of later complication, without mention of misadventure at the time of the procedure: Secondary | ICD-10-CM | POA: Insufficient documentation

## 2021-05-13 DIAGNOSIS — E1165 Type 2 diabetes mellitus with hyperglycemia: Secondary | ICD-10-CM | POA: Diagnosis not present

## 2021-05-13 DIAGNOSIS — T8241XA Breakdown (mechanical) of vascular dialysis catheter, initial encounter: Secondary | ICD-10-CM | POA: Insufficient documentation

## 2021-05-13 DIAGNOSIS — J9611 Chronic respiratory failure with hypoxia: Secondary | ICD-10-CM | POA: Diagnosis not present

## 2021-05-13 DIAGNOSIS — I517 Cardiomegaly: Secondary | ICD-10-CM | POA: Diagnosis not present

## 2021-05-13 DIAGNOSIS — K581 Irritable bowel syndrome with constipation: Secondary | ICD-10-CM | POA: Diagnosis not present

## 2021-05-13 DIAGNOSIS — R079 Chest pain, unspecified: Secondary | ICD-10-CM | POA: Diagnosis not present

## 2021-05-13 DIAGNOSIS — E1122 Type 2 diabetes mellitus with diabetic chronic kidney disease: Secondary | ICD-10-CM | POA: Diagnosis not present

## 2021-05-13 DIAGNOSIS — I350 Nonrheumatic aortic (valve) stenosis: Secondary | ICD-10-CM | POA: Diagnosis not present

## 2021-05-13 DIAGNOSIS — I953 Hypotension of hemodialysis: Secondary | ICD-10-CM | POA: Diagnosis not present

## 2021-05-13 DIAGNOSIS — K921 Melena: Secondary | ICD-10-CM | POA: Diagnosis not present

## 2021-05-13 DIAGNOSIS — Z20822 Contact with and (suspected) exposure to covid-19: Secondary | ICD-10-CM | POA: Diagnosis not present

## 2021-05-13 DIAGNOSIS — K625 Hemorrhage of anus and rectum: Secondary | ICD-10-CM | POA: Diagnosis not present

## 2021-05-13 DIAGNOSIS — I5032 Chronic diastolic (congestive) heart failure: Secondary | ICD-10-CM | POA: Diagnosis not present

## 2021-05-13 DIAGNOSIS — Z9981 Dependence on supplemental oxygen: Secondary | ICD-10-CM | POA: Diagnosis not present

## 2021-05-13 DIAGNOSIS — I132 Hypertensive heart and chronic kidney disease with heart failure and with stage 5 chronic kidney disease, or end stage renal disease: Secondary | ICD-10-CM | POA: Diagnosis not present

## 2021-05-13 DIAGNOSIS — D631 Anemia in chronic kidney disease: Secondary | ICD-10-CM | POA: Diagnosis not present

## 2021-05-13 DIAGNOSIS — J90 Pleural effusion, not elsewhere classified: Secondary | ICD-10-CM | POA: Diagnosis not present

## 2021-05-13 DIAGNOSIS — Z7689 Persons encountering health services in other specified circumstances: Secondary | ICD-10-CM | POA: Diagnosis not present

## 2021-05-13 DIAGNOSIS — E871 Hypo-osmolality and hyponatremia: Secondary | ICD-10-CM | POA: Diagnosis not present

## 2021-05-13 DIAGNOSIS — I214 Non-ST elevation (NSTEMI) myocardial infarction: Secondary | ICD-10-CM | POA: Diagnosis not present

## 2021-05-13 DIAGNOSIS — M79671 Pain in right foot: Secondary | ICD-10-CM | POA: Diagnosis not present

## 2021-05-13 DIAGNOSIS — T8249XA Other complication of vascular dialysis catheter, initial encounter: Secondary | ICD-10-CM | POA: Diagnosis not present

## 2021-05-13 HISTORY — PX: IR FLUORO GUIDE CV LINE RIGHT: IMG2283

## 2021-05-13 HISTORY — PX: IR PTA VENOUS EXCEPT DIALYSIS CIRCUIT: IMG6126

## 2021-05-13 MED ORDER — VANCOMYCIN HCL IN DEXTROSE 1-5 GM/200ML-% IV SOLN
INTRAVENOUS | Status: AC
  Administered 2021-05-13: 1000 mg via INTRAVENOUS
  Filled 2021-05-13: qty 200

## 2021-05-13 MED ORDER — VANCOMYCIN HCL IN DEXTROSE 1-5 GM/200ML-% IV SOLN: 1000.0000 mg | Freq: Once | INTRAVENOUS | Status: AC

## 2021-05-13 MED ORDER — HEPARIN SODIUM (PORCINE) 1000 UNIT/ML IJ SOLN
INTRAMUSCULAR | Status: AC
  Filled 2021-05-13: qty 10

## 2021-05-13 MED ORDER — LIDOCAINE HCL 1 % IJ SOLN
INTRAMUSCULAR | Status: AC
  Filled 2021-05-13: qty 20

## 2021-05-13 MED ORDER — IOHEXOL 300 MG/ML  SOLN
100.0000 mL | Freq: Once | INTRAMUSCULAR | Status: AC | PRN
  Administered 2021-05-13: 15 mL via INTRA_ARTERIAL

## 2021-05-14 DIAGNOSIS — Z992 Dependence on renal dialysis: Secondary | ICD-10-CM | POA: Diagnosis not present

## 2021-05-14 DIAGNOSIS — N186 End stage renal disease: Secondary | ICD-10-CM | POA: Diagnosis not present

## 2021-05-16 ENCOUNTER — Emergency Department: Payer: Self-pay

## 2021-05-16 ENCOUNTER — Emergency Department (HOSPITAL_COMMUNITY): Payer: Medicare Other

## 2021-05-16 ENCOUNTER — Encounter (HOSPITAL_COMMUNITY): Payer: Self-pay | Admitting: Emergency Medicine

## 2021-05-16 ENCOUNTER — Inpatient Hospital Stay (HOSPITAL_COMMUNITY)
Admission: EM | Admit: 2021-05-16 | Discharge: 2021-05-24 | DRG: 280 | Disposition: A | Discharge status: Home Health | Payer: Medicare Other | Attending: Internal Medicine | Admitting: Internal Medicine

## 2021-05-16 ENCOUNTER — Observation Stay (HOSPITAL_COMMUNITY): Payer: Medicare Other

## 2021-05-16 DIAGNOSIS — I350 Nonrheumatic aortic (valve) stenosis: Secondary | ICD-10-CM | POA: Diagnosis not present

## 2021-05-16 DIAGNOSIS — R7989 Other specified abnormal findings of blood chemistry: Secondary | ICD-10-CM | POA: Diagnosis present

## 2021-05-16 DIAGNOSIS — D631 Anemia in chronic kidney disease: Secondary | ICD-10-CM | POA: Diagnosis not present

## 2021-05-16 DIAGNOSIS — Z6841 Body Mass Index (BMI) 40.0 and over, adult: Secondary | ICD-10-CM

## 2021-05-16 DIAGNOSIS — E8779 Other fluid overload: Secondary | ICD-10-CM | POA: Diagnosis not present

## 2021-05-16 DIAGNOSIS — M79671 Pain in right foot: Secondary | ICD-10-CM | POA: Diagnosis not present

## 2021-05-16 DIAGNOSIS — T82510A Breakdown (mechanical) of surgically created arteriovenous fistula, initial encounter: Secondary | ICD-10-CM | POA: Diagnosis not present

## 2021-05-16 DIAGNOSIS — Z880 Allergy status to penicillin: Secondary | ICD-10-CM

## 2021-05-16 DIAGNOSIS — M79672 Pain in left foot: Secondary | ICD-10-CM | POA: Diagnosis not present

## 2021-05-16 DIAGNOSIS — K594 Anal spasm: Secondary | ICD-10-CM | POA: Diagnosis not present

## 2021-05-16 DIAGNOSIS — Z20822 Contact with and (suspected) exposure to covid-19: Secondary | ICD-10-CM | POA: Diagnosis not present

## 2021-05-16 DIAGNOSIS — Z992 Dependence on renal dialysis: Secondary | ICD-10-CM | POA: Diagnosis not present

## 2021-05-16 DIAGNOSIS — I953 Hypotension of hemodialysis: Secondary | ICD-10-CM | POA: Diagnosis not present

## 2021-05-16 DIAGNOSIS — K625 Hemorrhage of anus and rectum: Secondary | ICD-10-CM | POA: Diagnosis present

## 2021-05-16 DIAGNOSIS — E875 Hyperkalemia: Secondary | ICD-10-CM | POA: Diagnosis present

## 2021-05-16 DIAGNOSIS — K219 Gastro-esophageal reflux disease without esophagitis: Secondary | ICD-10-CM | POA: Diagnosis present

## 2021-05-16 DIAGNOSIS — R739 Hyperglycemia, unspecified: Secondary | ICD-10-CM | POA: Diagnosis not present

## 2021-05-16 DIAGNOSIS — Z7984 Long term (current) use of oral hypoglycemic drugs: Secondary | ICD-10-CM

## 2021-05-16 DIAGNOSIS — I132 Hypertensive heart and chronic kidney disease with heart failure and with stage 5 chronic kidney disease, or end stage renal disease: Secondary | ICD-10-CM | POA: Diagnosis present

## 2021-05-16 DIAGNOSIS — J90 Pleural effusion, not elsewhere classified: Secondary | ICD-10-CM | POA: Diagnosis not present

## 2021-05-16 DIAGNOSIS — R52 Pain, unspecified: Secondary | ICD-10-CM

## 2021-05-16 DIAGNOSIS — Z79899 Other long term (current) drug therapy: Secondary | ICD-10-CM

## 2021-05-16 DIAGNOSIS — K649 Unspecified hemorrhoids: Secondary | ICD-10-CM | POA: Diagnosis not present

## 2021-05-16 DIAGNOSIS — Z4901 Encounter for fitting and adjustment of extracorporeal dialysis catheter: Secondary | ICD-10-CM | POA: Diagnosis not present

## 2021-05-16 DIAGNOSIS — Z7982 Long term (current) use of aspirin: Secondary | ICD-10-CM

## 2021-05-16 DIAGNOSIS — R6889 Other general symptoms and signs: Secondary | ICD-10-CM | POA: Diagnosis not present

## 2021-05-16 DIAGNOSIS — E1165 Type 2 diabetes mellitus with hyperglycemia: Secondary | ICD-10-CM | POA: Diagnosis not present

## 2021-05-16 DIAGNOSIS — I5032 Chronic diastolic (congestive) heart failure: Secondary | ICD-10-CM | POA: Diagnosis not present

## 2021-05-16 DIAGNOSIS — E785 Hyperlipidemia, unspecified: Secondary | ICD-10-CM | POA: Diagnosis present

## 2021-05-16 DIAGNOSIS — Z9981 Dependence on supplemental oxygen: Secondary | ICD-10-CM

## 2021-05-16 DIAGNOSIS — E871 Hypo-osmolality and hyponatremia: Secondary | ICD-10-CM | POA: Diagnosis not present

## 2021-05-16 DIAGNOSIS — Z794 Long term (current) use of insulin: Secondary | ICD-10-CM | POA: Diagnosis not present

## 2021-05-16 DIAGNOSIS — N186 End stage renal disease: Secondary | ICD-10-CM | POA: Diagnosis not present

## 2021-05-16 DIAGNOSIS — D72829 Elevated white blood cell count, unspecified: Secondary | ICD-10-CM

## 2021-05-16 DIAGNOSIS — G8929 Other chronic pain: Secondary | ICD-10-CM | POA: Diagnosis present

## 2021-05-16 DIAGNOSIS — Y712 Prosthetic and other implants, materials and accessory cardiovascular devices associated with adverse incidents: Secondary | ICD-10-CM | POA: Diagnosis not present

## 2021-05-16 DIAGNOSIS — Z743 Need for continuous supervision: Secondary | ICD-10-CM | POA: Diagnosis not present

## 2021-05-16 DIAGNOSIS — I10 Essential (primary) hypertension: Secondary | ICD-10-CM | POA: Diagnosis present

## 2021-05-16 DIAGNOSIS — R079 Chest pain, unspecified: Secondary | ICD-10-CM | POA: Diagnosis not present

## 2021-05-16 DIAGNOSIS — I214 Non-ST elevation (NSTEMI) myocardial infarction: Principal | ICD-10-CM | POA: Diagnosis present

## 2021-05-16 DIAGNOSIS — I700 Atherosclerosis of aorta: Secondary | ICD-10-CM | POA: Diagnosis not present

## 2021-05-16 DIAGNOSIS — I517 Cardiomegaly: Secondary | ICD-10-CM | POA: Diagnosis not present

## 2021-05-16 DIAGNOSIS — E1169 Type 2 diabetes mellitus with other specified complication: Secondary | ICD-10-CM | POA: Diagnosis not present

## 2021-05-16 DIAGNOSIS — K648 Other hemorrhoids: Secondary | ICD-10-CM | POA: Diagnosis not present

## 2021-05-16 DIAGNOSIS — K581 Irritable bowel syndrome with constipation: Secondary | ICD-10-CM | POA: Diagnosis not present

## 2021-05-16 DIAGNOSIS — N261 Atrophy of kidney (terminal): Secondary | ICD-10-CM | POA: Diagnosis not present

## 2021-05-16 DIAGNOSIS — I120 Hypertensive chronic kidney disease with stage 5 chronic kidney disease or end stage renal disease: Secondary | ICD-10-CM | POA: Diagnosis not present

## 2021-05-16 DIAGNOSIS — M898X9 Other specified disorders of bone, unspecified site: Secondary | ICD-10-CM | POA: Diagnosis present

## 2021-05-16 DIAGNOSIS — J9611 Chronic respiratory failure with hypoxia: Secondary | ICD-10-CM | POA: Diagnosis not present

## 2021-05-16 DIAGNOSIS — I2510 Atherosclerotic heart disease of native coronary artery without angina pectoris: Secondary | ICD-10-CM | POA: Diagnosis not present

## 2021-05-16 DIAGNOSIS — K921 Melena: Secondary | ICD-10-CM | POA: Diagnosis present

## 2021-05-16 DIAGNOSIS — N189 Chronic kidney disease, unspecified: Secondary | ICD-10-CM | POA: Diagnosis not present

## 2021-05-16 DIAGNOSIS — M8580 Other specified disorders of bone density and structure, unspecified site: Secondary | ICD-10-CM | POA: Diagnosis present

## 2021-05-16 DIAGNOSIS — M7989 Other specified soft tissue disorders: Secondary | ICD-10-CM | POA: Diagnosis not present

## 2021-05-16 DIAGNOSIS — K5909 Other constipation: Secondary | ICD-10-CM | POA: Diagnosis present

## 2021-05-16 DIAGNOSIS — F419 Anxiety disorder, unspecified: Secondary | ICD-10-CM | POA: Diagnosis present

## 2021-05-16 DIAGNOSIS — Z9115 Patient's noncompliance with renal dialysis: Secondary | ICD-10-CM

## 2021-05-16 DIAGNOSIS — Z833 Family history of diabetes mellitus: Secondary | ICD-10-CM

## 2021-05-16 DIAGNOSIS — E1122 Type 2 diabetes mellitus with diabetic chronic kidney disease: Secondary | ICD-10-CM | POA: Diagnosis not present

## 2021-05-16 DIAGNOSIS — K644 Residual hemorrhoidal skin tags: Secondary | ICD-10-CM | POA: Diagnosis present

## 2021-05-16 DIAGNOSIS — Z885 Allergy status to narcotic agent status: Secondary | ICD-10-CM

## 2021-05-16 DIAGNOSIS — E1129 Type 2 diabetes mellitus with other diabetic kidney complication: Secondary | ICD-10-CM | POA: Diagnosis not present

## 2021-05-16 DIAGNOSIS — R0789 Other chest pain: Secondary | ICD-10-CM | POA: Diagnosis not present

## 2021-05-16 DIAGNOSIS — Z452 Encounter for adjustment and management of vascular access device: Secondary | ICD-10-CM | POA: Diagnosis not present

## 2021-05-16 DIAGNOSIS — N185 Chronic kidney disease, stage 5: Secondary | ICD-10-CM | POA: Diagnosis not present

## 2021-05-16 DIAGNOSIS — Z888 Allergy status to other drugs, medicaments and biological substances status: Secondary | ICD-10-CM

## 2021-05-16 DIAGNOSIS — R0602 Shortness of breath: Secondary | ICD-10-CM | POA: Diagnosis not present

## 2021-05-16 DIAGNOSIS — E7800 Pure hypercholesterolemia, unspecified: Secondary | ICD-10-CM | POA: Diagnosis present

## 2021-05-16 DIAGNOSIS — N250 Renal osteodystrophy: Secondary | ICD-10-CM | POA: Diagnosis not present

## 2021-05-16 DIAGNOSIS — J9691 Respiratory failure, unspecified with hypoxia: Secondary | ICD-10-CM | POA: Diagnosis not present

## 2021-05-16 DIAGNOSIS — T829XXA Unspecified complication of cardiac and vascular prosthetic device, implant and graft, initial encounter: Secondary | ICD-10-CM

## 2021-05-16 DIAGNOSIS — R1010 Upper abdominal pain, unspecified: Secondary | ICD-10-CM | POA: Diagnosis present

## 2021-05-16 LAB — TROPONIN I (HIGH SENSITIVITY)
Troponin I (High Sensitivity): 837 ng/L (ref <18)
Troponin I (High Sensitivity): 1355 ng/L (ref <18)

## 2021-05-16 LAB — RESP PANEL BY RT-PCR (FLU A&B, COVID) ARPGX2
Influenza A by PCR: NEGATIVE
Influenza B by PCR: NEGATIVE
SARS Coronavirus 2 by RT PCR: NEGATIVE

## 2021-05-16 LAB — CBC WITH DIFFERENTIAL/PLATELET
Abs Immature Granulocytes: 0.11 10*3/uL — ABNORMAL HIGH (ref 0.00–0.07)
Basophils Absolute: 0.1 10*3/uL (ref 0.0–0.1)
Basophils Relative: 1 %
Eosinophils Absolute: 0.4 10*3/uL (ref 0.0–0.5)
Eosinophils Relative: 3 %
HCT: 28.9 % — ABNORMAL LOW (ref 36.0–46.0)
Hemoglobin: 8.5 g/dL — ABNORMAL LOW (ref 12.0–15.0)
Immature Granulocytes: 1 %
Lymphocytes Relative: 8 %
Lymphs Abs: 1.1 10*3/uL (ref 0.7–4.0)
MCH: 29.6 pg (ref 26.0–34.0)
MCHC: 29.4 g/dL — ABNORMAL LOW (ref 30.0–36.0)
MCV: 100.7 fL — ABNORMAL HIGH (ref 80.0–100.0)
Monocytes Absolute: 0.9 10*3/uL (ref 0.1–1.0)
Monocytes Relative: 7 %
Neutro Abs: 10.2 10*3/uL — ABNORMAL HIGH (ref 1.7–7.7)
Neutrophils Relative %: 80 %
Platelets: 217 10*3/uL (ref 150–400)
RBC: 2.87 MIL/uL — ABNORMAL LOW (ref 3.87–5.11)
RDW: 15.5 % (ref 11.5–15.5)
WBC: 12.7 10*3/uL — ABNORMAL HIGH (ref 4.0–10.5)
nRBC: 0.3 % — ABNORMAL HIGH (ref 0.0–0.2)

## 2021-05-16 LAB — COMPREHENSIVE METABOLIC PANEL
ALT: 17 U/L (ref 0–44)
AST: 14 U/L — ABNORMAL LOW (ref 15–41)
Albumin: 3.5 g/dL (ref 3.5–5.0)
Alkaline Phosphatase: 69 U/L (ref 38–126)
Anion gap: 13 (ref 5–15)
BUN: 71 mg/dL — ABNORMAL HIGH (ref 6–20)
CO2: 24 mmol/L (ref 22–32)
Calcium: 9 mg/dL (ref 8.9–10.3)
Chloride: 93 mmol/L — ABNORMAL LOW (ref 98–111)
Creatinine, Ser: 14.11 mg/dL — ABNORMAL HIGH (ref 0.44–1.00)
GFR, Estimated: 3 mL/min — ABNORMAL LOW (ref >60)
Glucose, Bld: 166 mg/dL — ABNORMAL HIGH (ref 70–99)
Potassium: 5.1 mmol/L (ref 3.5–5.1)
Sodium: 130 mmol/L — ABNORMAL LOW (ref 135–145)
Total Bilirubin: 0.7 mg/dL (ref 0.3–1.2)
Total Protein: 7.9 g/dL (ref 6.5–8.1)

## 2021-05-16 LAB — CBG MONITORING, ED: Glucose-Capillary: 73 mg/dL (ref 70–99)

## 2021-05-16 LAB — GLUCOSE, CAPILLARY: Glucose-Capillary: 148 mg/dL — ABNORMAL HIGH (ref 70–99)

## 2021-05-16 MED ORDER — INSULIN ASPART 100 UNIT/ML IJ SOLN: 0.0000 [IU] | Freq: Four times a day (QID) | INTRAMUSCULAR | Status: DC

## 2021-05-16 MED ORDER — GABAPENTIN 100 MG PO CAPS
100.0000 mg | ORAL_CAPSULE | ORAL | Status: DC
  Administered 2021-05-17 – 2021-05-22 (×9): 100 mg via ORAL
  Filled 2021-05-16 (×7): qty 1

## 2021-05-16 MED ORDER — WITCH HAZEL-GLYCERIN EX PADS
1.0000 "application " | MEDICATED_PAD | Freq: Two times a day (BID) | CUTANEOUS | Status: DC | PRN
  Administered 2021-05-17 – 2021-05-20 (×3): 1 via TOPICAL
  Filled 2021-05-16 (×3): qty 100

## 2021-05-16 MED ORDER — ASPIRIN 81 MG PO CHEW
324.0000 mg | CHEWABLE_TABLET | Freq: Once | ORAL | Status: AC
  Administered 2021-05-16: 324 mg via ORAL
  Filled 2021-05-16: qty 4

## 2021-05-16 MED ORDER — SODIUM CHLORIDE 0.9 % IV SOLN: INTRAVENOUS | Status: DC

## 2021-05-16 MED ORDER — ATORVASTATIN CALCIUM 10 MG PO TABS
10.0000 mg | ORAL_TABLET | Freq: Every day | ORAL | Status: DC
  Administered 2021-05-17 – 2021-05-23 (×8): 10 mg via ORAL
  Filled 2021-05-16 (×8): qty 1

## 2021-05-16 MED ORDER — NITROGLYCERIN 0.4 MG SL SUBL: 0.4000 mg | SUBLINGUAL_TABLET | SUBLINGUAL | Status: DC | PRN

## 2021-05-16 MED ORDER — ASPIRIN 81 MG PO CHEW: 324.0000 mg | CHEWABLE_TABLET | Freq: Once | ORAL | Status: DC

## 2021-05-16 MED ORDER — GABAPENTIN 100 MG PO CAPS: 100.0000 mg | ORAL_CAPSULE | ORAL | Status: DC

## 2021-05-16 MED ORDER — CARVEDILOL 25 MG PO TABS
25.0000 mg | ORAL_TABLET | ORAL | Status: DC
  Filled 2021-05-16 (×2): qty 1

## 2021-05-16 MED ORDER — ONDANSETRON HCL 4 MG/2ML IJ SOLN: 4.0000 mg | Freq: Four times a day (QID) | INTRAMUSCULAR | Status: DC | PRN

## 2021-05-16 MED ORDER — CARVEDILOL 25 MG PO TABS
25.0000 mg | ORAL_TABLET | ORAL | Status: DC
  Administered 2021-05-17: 25 mg via ORAL
  Filled 2021-05-16 (×2): qty 1

## 2021-05-16 MED ORDER — CALCIUM CARBONATE ANTACID 500 MG PO CHEW
1500.0000 mg | CHEWABLE_TABLET | Freq: Three times a day (TID) | ORAL | Status: DC
  Administered 2021-05-17 – 2021-05-22 (×9): 1500 mg via ORAL
  Filled 2021-05-16 (×7): qty 8

## 2021-05-16 MED ORDER — DIPHENHYDRAMINE HCL 25 MG PO CAPS
50.0000 mg | ORAL_CAPSULE | Freq: Once | ORAL | Status: AC | PRN
  Administered 2021-05-17: 50 mg via ORAL
  Filled 2021-05-16: qty 2

## 2021-05-16 MED ORDER — NITROGLYCERIN 0.4 MG SL SUBL
0.4000 mg | SUBLINGUAL_TABLET | SUBLINGUAL | Status: AC | PRN
  Administered 2021-05-18 – 2021-05-24 (×3): 0.4 mg via SUBLINGUAL
  Filled 2021-05-16 (×3): qty 1

## 2021-05-16 MED ORDER — ISOSORBIDE MONONITRATE ER 60 MG PO TB24
60.0000 mg | ORAL_TABLET | Freq: Every day | ORAL | Status: DC
  Administered 2021-05-16 – 2021-05-19 (×4): 60 mg via ORAL
  Filled 2021-05-16 (×4): qty 1

## 2021-05-16 MED ORDER — ONDANSETRON HCL 4 MG PO TABS: 4.0000 mg | ORAL_TABLET | Freq: Four times a day (QID) | ORAL | Status: DC | PRN

## 2021-05-16 MED ORDER — HEPARIN BOLUS VIA INFUSION: 4000.0000 [IU] | Freq: Once | INTRAVENOUS | Status: DC

## 2021-05-16 MED ORDER — GABAPENTIN 100 MG PO CAPS
100.0000 mg | ORAL_CAPSULE | ORAL | Status: DC
  Administered 2021-05-18 – 2021-05-23 (×3): 100 mg via ORAL
  Filled 2021-05-16 (×5): qty 1

## 2021-05-16 MED ORDER — HEPARIN (PORCINE) 25000 UT/250ML-% IV SOLN: 1400.0000 [IU]/h | INTRAVENOUS | Status: DC

## 2021-05-16 MED ORDER — PANTOPRAZOLE SODIUM 40 MG PO TBEC
40.0000 mg | DELAYED_RELEASE_TABLET | Freq: Every day | ORAL | Status: DC
  Administered 2021-05-16 – 2021-05-24 (×9): 40 mg via ORAL
  Filled 2021-05-16 (×9): qty 1

## 2021-05-16 MED ORDER — CALCIUM CARBONATE ANTACID 500 MG PO CHEW
1.5000 | CHEWABLE_TABLET | ORAL | Status: DC | PRN
  Filled 2021-05-16 (×5): qty 2

## 2021-05-16 MED ORDER — CARVEDILOL 12.5 MG PO TABS: 25.0000 mg | ORAL_TABLET | Freq: Two times a day (BID) | ORAL | Status: DC

## 2021-05-16 NOTE — ED Notes (Signed)
[facility] is here  

## 2021-05-16 NOTE — ED Notes (Signed)
Patient has 3 hemorrhoids to rectum; rectum examine performed by Almyra Free, [place]

## 2021-05-16 NOTE — Progress Notes (Signed)
Spoke with Roselyn Reef, RN regarding the PICC order.  PICC cannot be placed in patient without nephrology approval due to the fact that she is active dialysis patient.  Roselyn Reef, RN states that she is notifying the doctor about this.  Will continue to monitor.

## 2021-05-16 NOTE — ED Provider Notes (Signed)
[facility] EMERGENCY DEPARTMENT Provider Note   CSN: [id number] Arrival date & time: 05/16/21  1106     History  Chief Complaint  Patient presents with   Blood In Stools    Courtney Gray is a 51 y.o. female with a history including type 2 diabetes, end-stage renal disease on dialysis Tuesday Thursday Saturday, GERD, morbid obesity presenting with multiple complaints.  Firstly, she has complaints of rectal bleeding with bowel movements along with pain full swelling at her rectum.  She describes pain with every bowel movement and has noted moderate blood in the toilet bowl with each bowel movement.  She is not bleeding in between episodes of bowel movements.  She has had a history of constipation in the past but has been taking OTC medications to try to prevent straining.  She denies abdominal pain, nausea or vomiting.  Secondly, she has complaints of persistent chest pain since her previous admission for this last month.  She describes midsternal chest pressure which is triggered by eating meals but can occur at other times as well.  She was admitted for this complaint last month at which time her workup was stable.  She did not undergo any provocative testing, but had a stable echocardiogram at which time medicine management was employed. She states that when she eats she has a heavy sensation in the middle of her chest which eventually improves.  She also is her pain seems worse at night frequently waking her from sleep.  She has been placed on Protonix and is also using Tums which does offer some relief but not completely relief.  She endorses waking at night with this pain as well.  She does have shortness of breath which is a chronic finding for her.  Not worsened today.  The history is provided by the patient.      Home Medications Prior to Admission medications   Medication Sig Start Date End Date Taking? Authorizing Provider  acetaminophen (TYLENOL) 500 MG tablet Take 1,000 mg by  mouth every 6 (six) hours as needed for moderate pain or headache.   Yes [provider]  amLODipine (NORVASC) 10 MG tablet Take 1 tablet (10 mg total) by mouth daily. Patient taking differently: Take 10 mg by mouth daily as needed (Blood pressusre). 11/29/20  Yes Roxan Hockey, MD  aspirin EC 81 MG tablet Take 1 tablet (81 mg total) by mouth daily with breakfast. 11/28/20  Yes Emokpae, Courage, MD  atorvastatin (LIPITOR) 10 MG tablet Take 1 tablet (10 mg total) by mouth daily. Patient taking differently: Take 10 mg by mouth at bedtime. 11/28/20  Yes Emokpae, Courage, MD  calcium carbonate (TUMS SMOOTHIES) 750 MG chewable tablet Chew 750-1,500 mg by mouth See admin instructions. Take 1500 mg with each meal and 750 mg with each snack   Yes [provider]  carvedilol (COREG) 25 MG tablet Take 1 tablet (25 mg total) by mouth 2 (two) times daily. Patient taking differently: Take 25 mg by mouth 2 (two) times daily. Do not take on dialysis days Tuesday Thur. And Sat. Take as needed. Take if blood pressure is over 140 11/28/20  Yes Emokpae, Courage, MD  diphenhydrAMINE (BENADRYL) 25 MG tablet Take 25 mg by mouth daily as needed for allergies or sleep.   Yes [provider]  docusate sodium (COLACE) 100 MG capsule Take 100 mg by mouth 2 (two) times daily.   Yes [provider]  doxercalciferol (HECTOROL) 4 MCG/2ML injection Inject 2.5 mLs (5 mcg  total) into the vein Every Tuesday,Thursday,and Saturday with dialysis. 09/01/20  Yes Enzo Bi, MD  gabapentin (NEURONTIN) 100 MG capsule Take 100-200 mg by mouth See admin instructions. Take 100 mg twice daily on non dialysis days.  Take 100 mg at 9am before dialysis. Take 100-200 mg at bedtime. May take a 100 mg in the evening as needed for pain. Tuesdays, Thursdays,and Saturdays. 04/20/19  Yes [provider]  glipiZIDE (GLUCOTROL XL) 2.5 MG 24 hr tablet Take 2.5 mg by mouth daily. 03/22/21  Yes [provider]   isosorbide mononitrate (IMDUR) 60 MG 24 hr tablet Take 1 tablet (60 mg total) by mouth daily. 04/23/21 04/23/22 Yes Emokpae, Courage, MD  LANTUS SOLOSTAR 100 UNIT/ML Solostar Pen Inject 12 Units into the skin at bedtime. 03/18/19  Yes [provider]  lisinopril (ZESTRIL) 20 MG tablet Take 1 tablet (20 mg total) by mouth daily. Patient taking differently: Take 20 mg by mouth daily as needed (Blood pressure). 11/28/20  Yes Emokpae, Courage, MD  loperamide (IMODIUM A-D) 2 MG tablet Take 4 mg by mouth as needed for diarrhea or loose stools.   Yes [provider]  nitroGLYCERIN (NITROSTAT) 0.4 MG SL tablet Place 0.4 mg under the tongue every 5 (five) minutes as needed for chest pain.   Yes [provider]  pantoprazole (PROTONIX) 40 MG tablet Take 1 tablet (40 mg total) by mouth daily. 11/29/20  Yes Emokpae, Courage, MD  Phenol (CHLORASEPTIC MT) Use as directed 4 sprays in the mouth or throat 4 (four) times daily as needed (throat pain).   Yes [provider]  tiZANidine (ZANAFLEX) 4 MG tablet Take 4 mg by mouth every 8 (eight) hours. Take with gabapentin at same time 04/20/19  Yes [provider]      Allergies    Benzonatate, Hydralazine, Amoxicillin, Clonidine, Morphine, and Oxycodone    Review of Systems   Review of Systems  Constitutional:  Negative for chills and fever.  HENT:  Negative for congestion and sore throat.   Eyes: Negative.   Respiratory:  Positive for shortness of breath. Negative for chest tightness.   Cardiovascular:  Positive for chest pain.  Gastrointestinal:  Positive for anal bleeding and rectal pain. Negative for abdominal pain, constipation and nausea.  Genitourinary: Negative.   Musculoskeletal:  Negative for arthralgias, joint swelling and neck pain.  Skin: Negative.  Negative for rash and wound.  Neurological:  Negative for dizziness, weakness, light-headedness, numbness and headaches.  Psychiatric/Behavioral: Negative.     All other systems reviewed and are negative.  Physical Exam Updated Vital Signs BP 106/83    Pulse 83    Temp 98.8 F (37.1 C) (Oral)    Resp 20    Ht 5\' 5"  (1.651 m)    Wt (!) 168.5 kg    SpO2 100%    BMI 61.82 kg/m  Physical Exam Vitals and nursing note reviewed. Exam conducted with a chaperone present.  Constitutional:      Appearance: She is well-developed. She is not ill-appearing.  HENT:     Head: Normocephalic and atraumatic.  Eyes:     Conjunctiva/sclera: Conjunctivae normal.  Cardiovascular:     Rate and Rhythm: Normal rate and regular rhythm.     Heart sounds: Normal heart sounds.  Pulmonary:     Effort: Pulmonary effort is normal.     Breath sounds: Normal breath sounds. No wheezing or rhonchi.     Comments: Distant breath sounds throughout all lung fields. Abdominal:  General: Abdomen is protuberant. Bowel sounds are normal.     Palpations: Abdomen is soft.     Tenderness: There is no abdominal tenderness.  Genitourinary:    Rectum: External hemorrhoid present.     Comments: 3 External hemorrhoids present,  tender but not thrombosed.  Soft brown stool present externally. No visible blood.  Musculoskeletal:        General: Normal range of motion.     Cervical back: Normal range of motion.  Skin:    General: Skin is warm and dry.  Neurological:     Mental Status: She is alert.    ED Results / Procedures / Treatments   Labs (all labs ordered are listed, but only abnormal results are displayed) Labs Reviewed  CBC WITH DIFFERENTIAL/PLATELET - Abnormal; Notable for the following components:      Result Value   WBC 12.7 (*)    RBC 2.87 (*)    Hemoglobin 8.5 (*)    HCT 28.9 (*)    MCV 100.7 (*)    MCHC 29.4 (*)    nRBC 0.3 (*)    Neutro Abs 10.2 (*)    Abs Immature Granulocytes 0.11 (*)    All other components within normal limits  COMPREHENSIVE METABOLIC PANEL - Abnormal; Notable for the following components:   Sodium 130 (*)    Chloride 93 (*)     Glucose, Bld 166 (*)    BUN 71 (*)    Creatinine, Ser 14.11 (*)    AST 14 (*)    GFR, Estimated 3 (*)    All other components within normal limits  TROPONIN I (HIGH SENSITIVITY) - Abnormal; Notable for the following components:   Troponin I (High Sensitivity) 837 (*)    All other components within normal limits  TROPONIN I (HIGH SENSITIVITY) - Abnormal; Notable for the following components:   Troponin I (High Sensitivity) 1,355 (*)    All other components within normal limits  RESP PANEL BY RT-PCR (FLU A&B, COVID) ARPGX2  CBC    EKG EKG Interpretation  Date/Time:  Thursday May 16 2021 11:45:33 EST Ventricular Rate:  76 PR Interval:  168 QRS Duration: 107 QT Interval:  409 QTC Calculation: 460 R Axis:   72 Text Interpretation: Sinus rhythm no ST elevation, no arrhythmia Confirmed by Noemi Chapel [phone]) on 05/16/2021 3:21:52 PM  Radiology DG Chest Port 1 View  Result Date: 05/16/2021 CLINICAL DATA:  Shortness of breath, chest pain EXAM: PORTABLE CHEST 1 VIEW COMPARISON:  Previous studies including the examination of 04/22/2021 FINDINGS: Transverse diameter of heart is increased. Central pulmonary vessels are prominent without signs of alveolar pulmonary edema. There is no focal pulmonary consolidation. Left lateral CP angle is indistinct. There is questionable minimal blunting of right lateral CP angle. There is no pneumothorax. IMPRESSION: Cardiomegaly. Central pulmonary vessels are prominent suggesting mild CHF. There are no signs of alveolar pulmonary edema or focal pulmonary consolidation. Small bilateral pleural effusions, more so on the left side. Electronically Signed   By: Elmer Picker M.D.   On: 05/16/2021 12:11   [place] EKG SITE RITE  Result Date: 05/16/2021 If Site Rite image not attached, placement could not be confirmed due to current cardiac rhythm.   Procedures Procedures  Try to prevent straining.  Medications Ordered in ED Medications  nitroGLYCERIN  (NITROSTAT) SL tablet 0.4 mg (has no administration in time range)  aspirin chewable tablet 324 mg (324 mg Oral Given 05/16/21 1331)    ED Course/ Medical Decision Making/  A&P                           Medical Decision Making Patient presenting with 2 complaints today, rectal bleeding with rectal pain associated with hemorrhoids.  Second complaint is ongoing chest pressure intermittent since her last hospitalization in January, worsened with food intake and frequently wakes from sleep.  Described as pressure.  No nausea or vomiting, diaphoresis with these episodes.  Amount and/or Complexity of Data Reviewed External Data Reviewed: labs and notes.    Details: Review of prior hospitalization including cardiology consult and hospital course.  Labs reviewed also from this hospitalization. Labs: ordered.    Details: Labs showing mild leukocytosis with a WBC count of 12.7.  She has a hemoglobin of 8.5, this is a macrocytic anemia, this is actually a little bit improved from prior hemoglobin which was 7.8.  Her creatinine is consistent with her kidney failure disease.  Her first troponin is concernedly elevated at 837.  Repeat troponin is 1355. Radiology: ordered.    Details: Mild chronic CHF on chest x-ray. ECG/medicine tests: ordered.    Details: No acute STEMI, normal sinus rhythm. Discussion of management or test interpretation with external provider(s): No acute STEMI, normal sinus rhythm.  Patient discussed with Dr. Admission of cardiology including presentation, labs and EKG.  He has recommended hospitalist admission here but with plans for her to get transferred to [facility] when there is a bed available.  He has deferred heparin at this point given her rectal bleeding.  Cardiology consult pending.  Discussed with Dr. Denton Brick of hospitalist service who accepts pt for admission.  Risk Prescription drug management. Decision regarding hospitalization.           Final Clinical Impression(s)  / ED Diagnoses Final diagnoses:  NSTEMI (non-ST elevated myocardial infarction) ([place])  Hemorrhoids, unspecified hemorrhoid type  Rectal bleeding    Rx / DC Orders ED Discharge Orders     None         Landis Martins 05/16/21 Delia Chimes, MD 05/17/21 [phone]

## 2021-05-16 NOTE — ED Notes (Signed)
Report given to [facility]. 

## 2021-05-16 NOTE — ED Notes (Signed)
Report Given to Summer, Rn.

## 2021-05-16 NOTE — ED Notes (Signed)
Patient does not complain of any chest pain at this time. Patient states she only has chest pain when moving.

## 2021-05-16 NOTE — ED Notes (Signed)
PICC team called this nurse and states, unless nephrology signs off on the patient to place PICC line, the vascular team cannot and willnot place PICC line. Vascular team also states there should be orders for IJ. Dr. Sabra Heck made aware and verbalized to document interaction. Awaiting further orders.

## 2021-05-16 NOTE — ED Notes (Signed)
Right arm extremity limb alert. 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 Fistula has not been used in 4 years. Patient has Perm Cath in Left IJ.

## 2021-05-16 NOTE — ED Provider Notes (Signed)
Medical screening examination/treatment/procedure(s) were conducted as a shared visit with non-physician practitioner(s) and myself.  I personally evaluated the patient during the encounter.  Clinical Impression:   Final diagnoses:  NSTEMI (non-ST elevated myocardial infarction) (H[facility])  Hemorrhoids, unspecified hemorrhoid type  Rectal bleeding    This patient is a unfortunately morbidly obese patient with a BMI of 61 at the height of 5 foot 5, she presents to the hospital with a complaint of possible rectal bleeding or hemorrhoidal bleeding and also found to have some ongoing chest pain.  The patient definitely has evidence of myocardial infarction based on a troponin of 830, thankfully the EKG does not show STEMI.  The hemoglobin is 8.5 which seems to be baseline or slightly higher than recently checked, January 17 it was 7.8, January 16 it was 8.9, back in November it was 11.6.  She is not anticoagulated, she is on dialysis, accesses in the right chest.  Right arm is restricted.  On exam the patient has clear lungs, clear heart sounds, she is not tachycardic, she is wide awake and answering question appropriately.  She has ongoing chest pain and is tearful regarding her diagnosis.  With a severely elevated troponin discussion with cardiology was had and they decided that they would like to do further investigation which may involve catheterization however given the patient's body habitus this may be restricted significantly.  They would like for her to be admitted to the hospital service and transferred to [facility].  They will consult.  The patient is agreeable to the plan, she is critically ill  .Critical Care Performed by: Noemi Chapel, MD Authorized by: Noemi Chapel, MD   Critical care provider statement:    Critical care time (minutes):  30   Critical care time was exclusive of:  Separately billable procedures and treating other patients and teaching time   Critical care was necessary  to treat or prevent imminent or life-threatening deterioration of the following conditions:  Cardiac failure   Critical care was time spent personally by me on the following activities:  Development of treatment plan with patient or surrogate, discussions with consultants, evaluation of patient's response to treatment, examination of patient, ordering and review of laboratory studies, ordering and review of radiographic studies, ordering and performing treatments and interventions, pulse oximetry, re-evaluation of patient's condition, review of old charts and obtaining history from patient or surrogate   I assumed direction of critical care for this patient from another provider in my specialty: no     Care discussed with: admitting provider   Comments:          Noemi Chapel, MD 05/17/21 [phone]

## 2021-05-16 NOTE — H&P (Signed)
History and Physical    Courtney Gray RDE:[id number] DOB: July 11, 1970 DOA: 05/16/2021  PCP: [facility], [facility]   Patient coming from: Home  I have personally briefly reviewed patient's old medical records in [place]  Chief Complaint: Bloody stool, chest pain  HPI: Courtney Gray is a 51 y.o. female with medical history significant for ESRD, DM, HTN. Patient presented to the ED with complaints of bleeding from her rectum.  She reports 2 episodes of same, 2 days ago and today.  She reports there was a lot of blood that filled the toilet, with presence of clots.  She denies black stools.  No abdominal pain.  No vomiting of blood.  She does not take NSAIDs.  Reports she has never had a colonoscopy. Patient was recently admitted for chest pain 1/16 to 1/17, cardiology was consulted, recommended some changes to her medication further cardiac testing was deferred at that time. She reports since discharge chest pain is persistent.  Chest pain is midsternal, present at rest and with exertion.  Nonradiating.  She reports difficulty breathing with mild exertion.  No leg swelling.   ED Course: Temperature 98.8.  Heart rate 72-99.  Respiratory rate 14-25.  Blood pressure systolic 631 497.  O2 sat 100% on room air.  Troponin 837 > 1355.  Sodium 130.  WBC 12.7.  Hemoglobin stable at 8.5. EDP did a rectal exam, findings of hemorrhoids. Cardiology was consulted, recommended admission to [facility].  Review of Systems: As per HPI all other systems reviewed and negative.  Past Medical History:  Diagnosis Date   Diabetes mellitus without complication (HCC)    High cholesterol    Hypertension    Renal disorder     Past Surgical History:  Procedure Laterality Date   AV FISTULA PLACEMENT     x2, unsuccessful.    IR FLUORO GUIDE CV LINE LEFT  12/14/2020   IR FLUORO GUIDE CV LINE LEFT  02/20/2021   IR FLUORO GUIDE CV LINE RIGHT  08/28/2020   IR FLUORO GUIDE CV LINE RIGHT  05/13/2021   IR PERC  TUN PERIT CATH WO PORT S&I /IMAG  09/06/2020   IR PTA VENOUS EXCEPT DIALYSIS CIRCUIT  02/20/2021   IR PTA VENOUS EXCEPT DIALYSIS CIRCUIT  05/13/2021   IR US GUIDE VASC ACCESS LEFT  09/06/2020   IR VENOCAVAGRAM SVC  02/20/2021     reports that she has never smoked. She has never used smokeless tobacco. She reports that she does not drink alcohol and does not use drugs.  Allergies  Allergen Reactions   Benzonatate Other (See Comments)    Hallucinations   Hydralazine Other (See Comments)    Hallucinations   Amoxicillin Nausea And Vomiting   Clonidine Other (See Comments)    Altered mental state   Morphine Itching   Oxycodone Itching    Family History  Problem Relation Age of Onset   Diabetes Mother    Diabetes Father    Prior to Admission medications   Medication Sig Start Date End Date Taking? Authorizing Provider  acetaminophen (TYLENOL) 500 MG tablet Take 1,000 mg by mouth every 6 (six) hours as needed for moderate pain or headache.   Yes [provider]  amLODipine (NORVASC) 10 MG tablet Take 1 tablet (10 mg total) by mouth daily. Patient taking differently: Take 10 mg by mouth daily as needed (Blood pressusre). 11/29/20  Yes Roxan Hockey, MD  aspirin EC 81 MG tablet Take 1 tablet (81 mg total) by mouth daily  with breakfast. 11/28/20  Yes Akoni Parton, [place], MD  atorvastatin (LIPITOR) 10 MG tablet Take 1 tablet (10 mg total) by mouth daily. Patient taking differently: Take 10 mg by mouth at bedtime. 11/28/20  Yes Briona Korpela, Courage, MD  calcium carbonate (TUMS SMOOTHIES) 750 MG chewable tablet Chew 750-1,500 mg by mouth See admin instructions. Take 1500 mg with each meal and 750 mg with each snack   Yes [provider]  carvedilol (COREG) 25 MG tablet Take 1 tablet (25 mg total) by mouth 2 (two) times daily. Patient taking differently: Take 25 mg by mouth 2 (two) times daily. Do not take on dialysis days Tuesday Thur. And Sat. Take as needed. Take if blood pressure is  over 140 11/28/20  Yes Caroljean Monsivais, Courage, MD  diphenhydrAMINE (BENADRYL) 25 MG tablet Take 25 mg by mouth daily as needed for allergies or sleep.   Yes [provider]  docusate sodium (COLACE) 100 MG capsule Take 100 mg by mouth 2 (two) times daily.   Yes [provider]  doxercalciferol (HECTOROL) 4 MCG/2ML injection Inject 2.5 mLs (5 mcg total) into the vein Every Tuesday,Thursday,and Saturday with dialysis. 09/01/20  Yes Enzo Bi, MD  gabapentin (NEURONTIN) 100 MG capsule Take 100-200 mg by mouth See admin instructions. Take 100 mg twice daily on non dialysis days.  Take 100 mg at 9am before dialysis. Take 100-200 mg at bedtime. May take a 100 mg in the evening as needed for pain. Tuesdays, Thursdays,and Saturdays. 04/20/19  Yes [provider]  glipiZIDE (GLUCOTROL XL) 2.5 MG 24 hr tablet Take 2.5 mg by mouth daily. 03/22/21  Yes [provider]  isosorbide mononitrate (IMDUR) 60 MG 24 hr tablet Take 1 tablet (60 mg total) by mouth daily. 04/23/21 04/23/22 Yes Azalie Harbeck, Courage, MD  LANTUS SOLOSTAR 100 UNIT/ML Solostar Pen Inject 12 Units into the skin at bedtime. 03/18/19  Yes [provider]  lisinopril (ZESTRIL) 20 MG tablet Take 1 tablet (20 mg total) by mouth daily. Patient taking differently: Take 20 mg by mouth daily as needed (Blood pressure). 11/28/20  Yes Kit Mollett, Courage, MD  loperamide (IMODIUM A-D) 2 MG tablet Take 4 mg by mouth as needed for diarrhea or loose stools.   Yes [provider]  nitroGLYCERIN (NITROSTAT) 0.4 MG SL tablet Place 0.4 mg under the tongue every 5 (five) minutes as needed for chest pain.   Yes [provider]  pantoprazole (PROTONIX) 40 MG tablet Take 1 tablet (40 mg total) by mouth daily. 11/29/20  Yes Dajahnae Vondra, Courage, MD  Phenol (CHLORASEPTIC MT) Use as directed 4 sprays in the mouth or throat 4 (four) times daily as needed (throat pain).   Yes [provider]  tiZANidine (ZANAFLEX) 4 MG tablet  Take 4 mg by mouth every 8 (eight) hours. Take with gabapentin at same time 04/20/19  Yes [provider]    Physical Exam: Vitals:   05/16/21 1730 05/16/21 1800 05/16/21 1930 05/16/21 1937  BP: (!) 158/67 (!) 156/73 (!) 145/64   Pulse: 88 92 83   Resp:  18 14   Temp:    98.8 F (37.1 C)  TempSrc:    Oral  SpO2: 100% 100% 100%   Weight:      Height:        Constitutional: Morbidly obese, NAD, calm,  Vitals:   05/16/21 1730 05/16/21 1800 05/16/21 1930 05/16/21 1937  BP: (!) 158/67 (!) 156/73 (!) 145/64   Pulse: 88 92 83   Resp:  18 14  Temp:    98.8 F (37.1 C)  TempSrc:    Oral  SpO2: 100% 100% 100%   Weight:      Height:       Eyes: PERRL, lids and conjunctivae normal ENMT: Mucous membranes are moist.  Neck: normal, supple, no masses, no thyromegaly Respiratory: clear to auscultation bilaterally, no wheezing, no crackles. Normal respiratory effort. No accessory muscle use.  Cardiovascular: Regular rate and rhythm, no murmurs / rubs / gallops.  Trace bilateral extremity edema. 2+ pedal pulses.  HD access left upper chest. Abdomen: obese, no tenderness, no masses palpated. No hepatosplenomegaly. Bowel sounds positive.  Musculoskeletal: no clubbing / cyanosis. No joint deformity upper and lower extremities. Good ROM, no contractures. Normal muscle tone.  Skin: no rashes, lesions, ulcers. No induration Neurologic: No apparent cranial nerve abnormality, moving extremities spontaneously. Psychiatric: Normal judgment and insight. Alert and oriented x 3. Normal mood.   Labs on Admission: I have personally reviewed following labs and imaging studies  CBC: Recent Labs  Lab 05/16/21 1115  WBC 12.7*  NEUTROABS 10.2*  HGB 8.5*  HCT 28.9*  MCV 100.7*  PLT 353   Basic Metabolic Panel: Recent Labs  Lab 05/16/21 1115  NA 130*  K 5.1  CL 93*  CO2 24  GLUCOSE 166*  BUN 71*  CREATININE 14.11*  CALCIUM 9.0   GFR: Estimated Creatinine Clearance: 7.6 mL/min (A)  (by C-G formula based on SCr of 14.11 mg/dL (H)). Liver Function Tests: Recent Labs  Lab 05/16/21 1115  AST 14*  ALT 17  ALKPHOS 69  BILITOT 0.7  PROT 7.9  ALBUMIN 3.5   CBG: Recent Labs  Lab 05/16/21 1919  GLUCAP 73    Radiological Exams on Admission: DG Chest Portable 1 View  Result Date: 05/16/2021 CLINICAL DATA:  IV access EXAM: PORTABLE CHEST 1 VIEW COMPARISON:  05/16/2021 FINDINGS: Left dialysis catheter with tip over the cavoatrial junction region, unchanged. New right central venous catheter is present with tip over the low SVC region. No pneumothorax. Cardiac enlargement. No vascular congestion, edema, or consolidation. Mediastinal contours appear intact. IMPRESSION: Appliances appear in satisfactory position.  Cardiac enlargement. Electronically Signed   By: Lucienne Capers M.D.   On: 05/16/2021 19:30   DG Chest Port 1 View  Result Date: 05/16/2021 CLINICAL DATA:  Shortness of breath, chest pain EXAM: PORTABLE CHEST 1 VIEW COMPARISON:  Previous studies including the examination of 04/22/2021 FINDINGS: Transverse diameter of heart is increased. Central pulmonary vessels are prominent without signs of alveolar pulmonary edema. There is no focal pulmonary consolidation. Left lateral CP angle is indistinct. There is questionable minimal blunting of right lateral CP angle. There is no pneumothorax. IMPRESSION: Cardiomegaly. Central pulmonary vessels are prominent suggesting mild CHF. There are no signs of alveolar pulmonary edema or focal pulmonary consolidation. Small bilateral pleural effusions, more so on the left side. Electronically Signed   By: Elmer Picker M.D.   On: 05/16/2021 12:11   [place] EKG SITE RITE  Result Date: 05/16/2021 If Site Rite image not attached, placement could not be confirmed due to current cardiac rhythm.   EKG: Independently reviewed.  Sinus rhythm rate 76, QTc 460.  No ST or T wave abnormalities compared to prior EKG.  Assessment/Plan Principal  Problem:   NSTEMI (non-ST elevated myocardial infarction) ([place]) Active Problems:   Type 2 diabetes mellitus with other specified complication ([place])   Morbid obesity ([place])   HTN (hypertension)   ESRD (end stage renal disease) (HCC)   Chronic  respiratory failure with hypoxia (HCC)   NSTEMI-chest pains, troponin elevated and trending up 837 >> 1355.  EKG unchanged.   - Evaluated by cardiology here at [facility]- Dr. Johnsie Cancel, recommended admission to [facility].  Hold off on heparin due to rectal bleeding.  Patient high risk for Cath. Continue nitrates, beta-blocker can use IV nitro if pain reoccurs.  Recheck echo in the morning.  Will need GI evaluation and nephrology to do dialysis. -Trend troponins -Repeat EKG in the morning -Care or instructions to page cardiology on arrival to [facility] -N.p.o. midnight - PICC line requested by EDP and placed  Rectal bleeding-2 episodes of same.  Has hemorrhoids.  Has never had colonoscopy.  Denies prior GI bleed, no melena, denies NSAID use.  Hemoglobin today stable 8.5, baseline 7.8-10.3, vitals stable. -Will need GI evaluation, as requested by cardiology, please consult GI in the morning -Trend CBC  ESRD-schedule Tuesday Thursday Saturday.  Last HD session 2 days ago.  She did not get dialyzed today.  At this time she appears euvolemic.  Without congestion or edema. -Please consult nephrology in the morning.  Uncontrolled diabetes mellitus-random glucose 166 > 73.  Hemoglobin A1c 8.9 - SSi-Us, Q6h -Hold home Lantus  Hyponatremia sodium 130, baseline 1 36-1 38. - per hd  Hypertension-  -Resume Imdur, carvedilol. -She takes Norvasc and lisinopril as needed.  Chronic hypoxic respiratory failure-on 3 L.  O2 sats currently 100%.   DVT prophylaxis: SCDs Code Status: Full Family Communication: Daughter at bedside Disposition Plan:   ~ 2 days Consults called:  Cardiology.  Please consult nephrology and GI in the morning. Admission status: Inpt  tele  I certify that at the point of admission it is my clinical judgment that the patient will require inpatient hospital care spanning beyond 2 midnights from the point of admission due to high intensity of service, high risk for further deterioration and high frequency of surveillance required.    Bethena Roys MD [facility]  05/16/2021, 8:34 PM

## 2021-05-16 NOTE — ED Notes (Signed)
PICC team at bedside

## 2021-05-16 NOTE — ED Notes (Signed)
Patient given Juice, crackers, and applesauce.

## 2021-05-16 NOTE — Consult Note (Signed)
CARDIOLOGY CONSULT NOTE       Patient ID: Courtney Gray MRN: [id number] DOB/AGE: 1971-04-06 51 y.o.  Admit date: 05/16/2021 Referring Physician: Evalee Jefferson AP ED Primary Physician: Medicine, Ledell Noss Internal Primary Cardiologist: Carlyle Dolly Reason for Consultation: SEMI  Active Problems:   * No active hospital problems. *   HPI:  51 y.o. morbidly obese black female on chronic dialysis History of DM, HLD, HTN  Notes indicate chronic respiratory failure on 3 L [place] at home Seen by Dr Harl Bowie 04/23/21 for chest pain some GI overtones but SL nitro helped Dr Harl Bowie felt TTE showed no RWMA normal EF and troponin negative Added imdur and did not feel any other w/u needed. She returns to ER today missing dialysis She has a hemorrhoidal bleed Chronically anemic with Hct stable 28.9.  She also complained of intermittent SSCP First troponin is elevated this time at 837.  She is currently pain free with no acute ECG changes   ROS All other systems reviewed and negative except as noted above  Past Medical History:  Diagnosis Date   Diabetes mellitus without complication (HCC)    High cholesterol    Hypertension    Renal disorder     Family History  Problem Relation Age of Onset   Diabetes Mother    Diabetes Father     Social History   Socioeconomic History   Marital status: Single    Spouse name: Not on file   Number of children: Not on file   Years of education: Not on file   Highest education level: Not on file  Occupational History   Not on file  Tobacco Use   Smoking status: Never   Smokeless tobacco: Never  Vaping Use   Vaping Use: Never used  Substance and Sexual Activity   Alcohol use: Never   Drug use: Never   Sexual activity: Not on file  Other Topics Concern   Not on file  Social History Narrative   Not on file   Social Determinants of Health   Financial Resource Strain: Not on file  Food Insecurity: Not on file  Transportation Needs: Not on file  Physical  Activity: Not on file  Stress: Not on file  Social Connections: Not on file  Intimate Partner Violence: Not on file    Past Surgical History:  Procedure Laterality Date   AV FISTULA PLACEMENT     x2, unsuccessful.    IR FLUORO GUIDE CV LINE LEFT  12/14/2020   IR FLUORO GUIDE CV LINE LEFT  02/20/2021   IR FLUORO GUIDE CV LINE RIGHT  08/28/2020   IR FLUORO GUIDE CV LINE RIGHT  05/13/2021   IR PERC TUN PERIT CATH WO PORT S&I /IMAG  09/06/2020   IR PTA VENOUS EXCEPT DIALYSIS CIRCUIT  02/20/2021   IR PTA VENOUS EXCEPT DIALYSIS CIRCUIT  05/13/2021   IR US GUIDE VASC ACCESS LEFT  09/06/2020   IR VENOCAVAGRAM SVC  02/20/2021      Current Facility-Administered Medications:    heparin ADULT infusion 100 units/mL (25000 units/26mL), 1,400 Units/hr, Intravenous, Continuous, Milton Ferguson, MD   heparin bolus via infusion 4,000 Units, 4,000 Units, Intravenous, Once, Milton Ferguson, MD   nitroGLYCERIN (NITROSTAT) SL tablet 0.4 mg, 0.4 mg, Sublingual, Q5 min PRN, Evalee Jefferson, PA-C  Current Outpatient Medications:    acetaminophen (TYLENOL) 500 MG tablet, Take 1,000 mg by mouth every 6 (six) hours as needed for moderate pain or headache., Disp: , Rfl:    amLODipine (NORVASC) 10  MG tablet, Take 1 tablet (10 mg total) by mouth daily. (Patient taking differently: Take 10 mg by mouth at bedtime.), Disp: 30 tablet, Rfl: 5   aspirin EC 81 MG tablet, Take 1 tablet (81 mg total) by mouth daily with breakfast., Disp: 30 tablet, Rfl: 11   atorvastatin (LIPITOR) 10 MG tablet, Take 1 tablet (10 mg total) by mouth daily. (Patient taking differently: Take 10 mg by mouth at bedtime.), Disp: 30 tablet, Rfl: 5   calcium carbonate (TUMS SMOOTHIES) 750 MG chewable tablet, Chew 750-1,500 mg by mouth See admin instructions. Take 1500 mg with each meal and 750 mg with each snack, Disp: , Rfl:    carvedilol (COREG) 25 MG tablet, Take 1 tablet (25 mg total) by mouth 2 (two) times daily. (Patient taking differently: Take 25 mg by  mouth 2 (two) times daily. 12.5mg   twice a day on dialysis days Tuesday Thur. And Sat.), Disp: 60 tablet, Rfl: 3   diphenhydrAMINE (BENADRYL) 25 MG tablet, Take 25 mg by mouth daily as needed for allergies or sleep., Disp: , Rfl:    docusate sodium (COLACE) 100 MG capsule, Take 100 mg by mouth 2 (two) times daily., Disp: , Rfl:    doxercalciferol (HECTOROL) 4 MCG/2ML injection, Inject 2.5 mLs (5 mcg total) into the vein Every Tuesday,Thursday,and Saturday with dialysis., Disp: 2 mL, Rfl:    gabapentin (NEURONTIN) 100 MG capsule, Take 100-200 mg by mouth See admin instructions. Take 100 mg twice daily on non dialysis days.  Take 100 mg at 9am before dialysis. Take 100-200 mg at bedtime. May take a 100 mg in the evening as needed for pain. Tuesdays, Thursdays,and Saturdays., Disp: , Rfl:    glipiZIDE (GLUCOTROL XL) 2.5 MG 24 hr tablet, Take 2.5 mg by mouth daily., Disp: , Rfl:    isosorbide mononitrate (IMDUR) 60 MG 24 hr tablet, Take 1 tablet (60 mg total) by mouth daily., Disp: 30 tablet, Rfl: 11   lanthanum (FOSRENOL) 500 MG chewable tablet, Chew 3 tablets by mouth 3 (three) times daily., Disp: , Rfl:    LANTUS SOLOSTAR 100 UNIT/ML Solostar Pen, Inject 12 Units into the skin at bedtime., Disp: , Rfl:    lisinopril (ZESTRIL) 20 MG tablet, Take 1 tablet (20 mg total) by mouth daily., Disp: 30 tablet, Rfl: 3   loperamide (IMODIUM A-D) 2 MG tablet, Take 4 mg by mouth as needed for diarrhea or loose stools., Disp: , Rfl:    nitroGLYCERIN (NITROSTAT) 0.4 MG SL tablet, Place 0.4 mg under the tongue every 5 (five) minutes as needed for chest pain., Disp: , Rfl:    pantoprazole (PROTONIX) 40 MG tablet, Take 1 tablet (40 mg total) by mouth daily., Disp: 30 tablet, Rfl: 5   Phenol (CHLORASEPTIC MT), Use as directed 4 sprays in the mouth or throat 4 (four) times daily as needed (throat pain)., Disp: , Rfl:    sodium chloride (OCEAN) 0.65 % SOLN nasal spray, Place 1 spray into both nostrils 2 (two) times daily as  needed for congestion., Disp: , Rfl:    SPS 15 GM/60ML suspension, Take 60 mLs by mouth See admin instructions. Weekly as needed to lower potassium, Disp: , Rfl:    tiZANidine (ZANAFLEX) 4 MG tablet, Take 4 mg by mouth every 8 (eight) hours. Take with gabapentin at same time, Disp: , Rfl:   heparin  4,000 Units Intravenous Once    heparin      Physical Exam: Blood pressure 106/83, pulse 83, temperature 98.8 F (37.1  C), temperature source Oral, resp. rate 20, height 5\' 5"  (1.651 m), weight (!) 168.5 kg, SpO2 100 %.    Morbidly obese black female Lungs clear anteriorly Distant heart sounds Huge pannus unable to easily palpate femorals Fistual in RUE Palpable left radial Plus one edema bilaterally   Labs:   Lab Results  Component Value Date   WBC 12.7 (H) 05/16/2021   HGB 8.5 (L) 05/16/2021   HCT 28.9 (L) 05/16/2021   MCV 100.7 (H) 05/16/2021   PLT 217 05/16/2021    Recent Labs  Lab 05/16/21 1115  NA 130*  K 5.1  CL 93*  CO2 24  BUN 71*  CREATININE 14.11*  CALCIUM 9.0  PROT 7.9  BILITOT 0.7  ALKPHOS 69  ALT 17  AST 14*  GLUCOSE 166*   No results found for: CKTOTAL, CKMB, CKMBINDEX, TROPONINI No results found for: CHOL No results found for: HDL No results found for: LDLCALC No results found for: TRIG No results found for: CHOLHDL No results found for: LDLDIRECT    Radiology: DG Chest 2 View  Result Date: 04/22/2021 CLINICAL DATA:  Chest pain. EXAM: CHEST - 2 VIEW COMPARISON:  None. FINDINGS: There is stable left internal jugular venous catheter positioning. There is mild to moderate severity enlargement of the cardiac silhouette which is unchanged in severity. Both lungs are clear. The visualized skeletal structures are unremarkable. IMPRESSION: Stable cardiomegaly without evidence of acute or active cardiopulmonary disease. Electronically Signed   By: Virgina Norfolk M.D.   On: 04/22/2021 21:56   IR Fluoro Guide CV Line Right  Result Date:  05/13/2021 INDICATION: 51 year old female with a history of end-stage renal disease on hemodialysis via a left IJ 28 cm palindrome hemodialysis catheter. This catheter was last exchanged on 02/20/2021 at which time balloon disruption of an underlying fibrin sheath was performed. The catheter then worked well until recently when she began experience positional dependence and poor flows during hemodialysis. She presents for catheter evaluation with possible repeat balloon disruption and replacement. Of note, in the future the patient would like to switch to a right sided hemodialysis catheter. However, she was not prepared for moderate sedation today. EXAM: IR RIGHT FLUORO GUIDE CV LINE; IR PTA VENOUS EXCEPT DIALYSIS CIRCUIT MEDICATIONS: None. ANESTHESIA/SEDATION: None. FLUOROSCOPY TIME:  145 mGy air kerma COMPLICATIONS: None immediate. PROCEDURE: Informed written consent was obtained from the patient after a thorough discussion of the procedural risks, benefits and alternatives. All questions were addressed. Maximal Sterile Barrier Technique was utilized including caps, mask, sterile gowns, sterile gloves, sterile drape, hand hygiene and skin antiseptic. A timeout was performed prior to the initiation of the procedure. Initial spot fluoroscopic imaging was performed. The 28 cm palindrome tunneled hemodialysis catheter is relatively well position with the catheter tip overlying the upper right atrium. The venous port flushes and aspirates easily. The arterial port could not be aspirated. Contrast injection was first performed through the venous port. This demonstrates normal filling of the right atrium. Local anesthesia was attained by infiltration with 1% lidocaine. The retention cuff was released from the surrounding soft tissues using sharp dissection. A stiff glidewire was advanced through the venous lumen, through the right heart and into the inferior vena cava. As the catheter was manipulated, aspiration was  maintained on the arterial port in till there was successful aspiration of the indwelling heparin and blood. At this time, contrast was injected through the arterial lumen under digital subtraction angiography. There is clear evidence of a fibrin sheath along  the distal aspect of the catheter. This is likely the underlying source of catheter malfunction. The catheter was removed. A 16 mm atlas balloon was advanced over the wire and positioned in the region of the fibrin sheath. The balloon was then inflated to full effacement disrupting the fibrin sheath. The balloon was removed. A new 28 cm palindrome tunneled hemodialysis catheter was advanced over the wire and position with the tip in the right mid atrium. Both the arterial and venous lumens flush and aspirate easily. The catheter was flushed with saline in the lumens locked with 2.1 mL heparin. The catheter was capped and secured to the skin with 0 Prolene suture. Sterile bandages were applied. The patient tolerated the procedure well. IMPRESSION: 1. Fibrin sheath partially obstructing flow through the arterial lumen. 2. Successful balloon disruption of fibrin sheath. 3. Successful exchange for a new 28 cm palindrome tunneled hemodialysis catheter. Catheter tips are in the right mid atrium and the device is ready for immediate use. Electronically Signed   By: Jacqulynn Cadet M.D.   On: 05/13/2021 13:09   DG Chest Port 1 View  Result Date: 05/16/2021 CLINICAL DATA:  Shortness of breath, chest pain EXAM: PORTABLE CHEST 1 VIEW COMPARISON:  Previous studies including the examination of 04/22/2021 FINDINGS: Transverse diameter of heart is increased. Central pulmonary vessels are prominent without signs of alveolar pulmonary edema. There is no focal pulmonary consolidation. Left lateral CP angle is indistinct. There is questionable minimal blunting of right lateral CP angle. There is no pneumothorax. IMPRESSION: Cardiomegaly. Central pulmonary vessels are  prominent suggesting mild CHF. There are no signs of alveolar pulmonary edema or focal pulmonary consolidation. Small bilateral pleural effusions, more so on the left side. Electronically Signed   By: Elmer Picker M.D.   On: 05/16/2021 12:11   DG Foot 2 Views Right  Result Date: 04/23/2021 CLINICAL DATA:  Evaluate for fracture. Complains of pain in the first and second phalanx of the right foot. EXAM: RIGHT FOOT - 2 VIEW COMPARISON:  None. FINDINGS: The bones appear diffusely osteopenic. There is diffuse soft tissue swelling identified. Extensive vascular calcifications are identified. Hallux valgus deformity. Second through fifth hammertoe deformities. No signs of acute fracture or dislocation. IMPRESSION: 1. No acute bone abnormality. 2. Osteopenia. 3. Soft tissue swelling. Electronically Signed   By: Kerby Moors M.D.   On: 04/23/2021 05:28   IR PTA VENOUS EXCEPT DIALYSIS CIRCUIT  Result Date: 05/13/2021 INDICATION: 51 year old female with a history of end-stage renal disease on hemodialysis via a left IJ 28 cm palindrome hemodialysis catheter. This catheter was last exchanged on 02/20/2021 at which time balloon disruption of an underlying fibrin sheath was performed. The catheter then worked well until recently when she began experience positional dependence and poor flows during hemodialysis. She presents for catheter evaluation with possible repeat balloon disruption and replacement. Of note, in the future the patient would like to switch to a right sided hemodialysis catheter. However, she was not prepared for moderate sedation today. EXAM: IR RIGHT FLUORO GUIDE CV LINE; IR PTA VENOUS EXCEPT DIALYSIS CIRCUIT MEDICATIONS: None. ANESTHESIA/SEDATION: None. FLUOROSCOPY TIME:  145 mGy air kerma COMPLICATIONS: None immediate. PROCEDURE: Informed written consent was obtained from the patient after a thorough discussion of the procedural risks, benefits and alternatives. All questions were addressed.  Maximal Sterile Barrier Technique was utilized including caps, mask, sterile gowns, sterile gloves, sterile drape, hand hygiene and skin antiseptic. A timeout was performed prior to the initiation of the procedure. Initial spot fluoroscopic  imaging was performed. The 28 cm palindrome tunneled hemodialysis catheter is relatively well position with the catheter tip overlying the upper right atrium. The venous port flushes and aspirates easily. The arterial port could not be aspirated. Contrast injection was first performed through the venous port. This demonstrates normal filling of the right atrium. Local anesthesia was attained by infiltration with 1% lidocaine. The retention cuff was released from the surrounding soft tissues using sharp dissection. A stiff glidewire was advanced through the venous lumen, through the right heart and into the inferior vena cava. As the catheter was manipulated, aspiration was maintained on the arterial port in till there was successful aspiration of the indwelling heparin and blood. At this time, contrast was injected through the arterial lumen under digital subtraction angiography. There is clear evidence of a fibrin sheath along the distal aspect of the catheter. This is likely the underlying source of catheter malfunction. The catheter was removed. A 16 mm atlas balloon was advanced over the wire and positioned in the region of the fibrin sheath. The balloon was then inflated to full effacement disrupting the fibrin sheath. The balloon was removed. A new 28 cm palindrome tunneled hemodialysis catheter was advanced over the wire and position with the tip in the right mid atrium. Both the arterial and venous lumens flush and aspirate easily. The catheter was flushed with saline in the lumens locked with 2.1 mL heparin. The catheter was capped and secured to the skin with 0 Prolene suture. Sterile bandages were applied. The patient tolerated the procedure well. IMPRESSION: 1. Fibrin  sheath partially obstructing flow through the arterial lumen. 2. Successful balloon disruption of fibrin sheath. 3. Successful exchange for a new 28 cm palindrome tunneled hemodialysis catheter. Catheter tips are in the right mid atrium and the device is ready for immediate use. Electronically Signed   By: Jacqulynn Cadet M.D.   On: 05/13/2021 13:09   ECHOCARDIOGRAM LIMITED  Result Date: 04/23/2021    ECHOCARDIOGRAM LIMITED REPORT   Patient Name:   MALLORI ARAQUE Date of Exam: 04/23/2021 Medical Rec #:  [id number]       Height:       65.0 in Accession #:    [id number]      Weight:       380.3 lb Date of Birth:  Oct 22, 1970        BSA:          2.599 m Patient Age:    67 years        BP:           165/93 mmHg Patient Gender: F               HR:           68 bpm. Exam Location:  [facility] Procedure: Limited Echo Indications:    Assess EF and wall motion  History:        Patient has prior history of Echocardiogram examinations, most                 recent 11/27/2020. Signs/Symptoms:Chest Pain; Risk                 Factors:Hypertension and Diabetes.  Sonographer:    Wenda Low Referring Phys: [id number] Erma Heritage  Sonographer Comments: Technically challenging study due to limited acoustic windows, Technically difficult study due to poor echo windows and patient is morbidly obese. Study completed with patient sitting in recliner. She refused to be moved to the bed.  IMPRESSIONS  1. Left ventricular ejection fraction, by estimation, is 55 to 60%. The left ventricle has normal function. Left ventricular endocardial border not optimally defined to evaluate regional wall motion.  2. Limited echo to evaluate LV function. FINDINGS  Left Ventricle: Left ventricular ejection fraction, by estimation, is 55 to 60%. The left ventricle has normal function. Left ventricular endocardial border not optimally defined to evaluate regional wall motion. The left ventricular internal cavity size was normal in size. Suboptimal  image quality limits for assessment of left ventricular hypertrophy. LEFT VENTRICLE PLAX 2D LVIDd:         4.80 cm LVIDs:         3.30 cm LV PW:         1.70 cm LV IVS:        1.60 cm  LEFT ATRIUM         Index LA diam:    4.20 cm 1.62 cm/m Carlyle Dolly MD Electronically signed by Carlyle Dolly MD Signature Date/Time: 04/23/2021/11:29:46 AM    Final    [place] EKG SITE RITE  Result Date: 05/16/2021 If Site Rite image not attached, placement could not be confirmed due to current cardiac rhythm.   EKG: SR rate 76 normal   ASSESSMENT AND PLAN:  SEMI:  recurrent SSCP since seen by Dr Harl Bowie and in hospital end of January Previously troponin negative and ECG still not acute. No tropnin elevation to 837. She is high risk for cath. Would not cath from femoral high risk of bleeding Typically would not cath dialysis patient from [place] but in this case would think left radial better than femoral. No heparin with hemorrhoid  bleeding Hct stable Continue nitrates and beta blocker Can use iv nitro if pain recurs. Hospitalist admission with transfer to [facility] on hospitalist service when bed available Will need GI to assess further bleeding risk of hemorrhoid and ability to use heparin/DAT Will need nephrology to do dialysis Pending results of serial troponin and ECG she will likely need cath from left radial. She has multiple co morbidities and extremely limited functional status  Can recheck TTE in am to see if EF still normal or new RWMA but previous limited echo had poor windows   Signed: Jenkins Rouge 05/16/2021, 4:22 PM

## 2021-05-16 NOTE — ED Triage Notes (Signed)
Pt to the ED with complaints of rectal bleeding, intermittent chest pain, and weakness.  Pt is on chronic oxygen 3-4 liters at all times. The pt last had dialysis on Tuesday.

## 2021-05-17 ENCOUNTER — Inpatient Hospital Stay (HOSPITAL_COMMUNITY): Payer: Medicare Other

## 2021-05-17 ENCOUNTER — Other Ambulatory Visit: Payer: Self-pay

## 2021-05-17 DIAGNOSIS — I214 Non-ST elevation (NSTEMI) myocardial infarction: Secondary | ICD-10-CM | POA: Diagnosis not present

## 2021-05-17 DIAGNOSIS — M79671 Pain in right foot: Secondary | ICD-10-CM | POA: Diagnosis not present

## 2021-05-17 DIAGNOSIS — K921 Melena: Secondary | ICD-10-CM

## 2021-05-17 DIAGNOSIS — I132 Hypertensive heart and chronic kidney disease with heart failure and with stage 5 chronic kidney disease, or end stage renal disease: Secondary | ICD-10-CM | POA: Diagnosis not present

## 2021-05-17 DIAGNOSIS — N185 Chronic kidney disease, stage 5: Secondary | ICD-10-CM | POA: Diagnosis not present

## 2021-05-17 DIAGNOSIS — Z6841 Body Mass Index (BMI) 40.0 and over, adult: Secondary | ICD-10-CM

## 2021-05-17 DIAGNOSIS — N186 End stage renal disease: Secondary | ICD-10-CM | POA: Diagnosis not present

## 2021-05-17 DIAGNOSIS — Z20822 Contact with and (suspected) exposure to covid-19: Secondary | ICD-10-CM | POA: Diagnosis not present

## 2021-05-17 DIAGNOSIS — K625 Hemorrhage of anus and rectum: Secondary | ICD-10-CM | POA: Diagnosis present

## 2021-05-17 DIAGNOSIS — D631 Anemia in chronic kidney disease: Secondary | ICD-10-CM | POA: Diagnosis present

## 2021-05-17 DIAGNOSIS — J9611 Chronic respiratory failure with hypoxia: Secondary | ICD-10-CM | POA: Diagnosis not present

## 2021-05-17 LAB — ECHOCARDIOGRAM COMPLETE
AR max vel: 1.47 cm2
AV Area VTI: 1.58 cm2
AV Area mean vel: 1.41 cm2
AV Mean grad: 13 mmHg
AV Peak grad: 24 mmHg
Ao pk vel: 2.45 m/s
Area-P 1/2: 2.41 cm2
Height: 65 in
S' Lateral: 3.4 cm
Weight: 6003.57 oz

## 2021-05-17 LAB — BASIC METABOLIC PANEL
Anion gap: 16 — ABNORMAL HIGH (ref 5–15)
BUN: 76 mg/dL — ABNORMAL HIGH (ref 6–20)
CO2: 22 mmol/L (ref 22–32)
Calcium: 8.9 mg/dL (ref 8.9–10.3)
Chloride: 95 mmol/L — ABNORMAL LOW (ref 98–111)
Creatinine, Ser: 15.64 mg/dL — ABNORMAL HIGH (ref 0.44–1.00)
GFR, Estimated: 3 mL/min — ABNORMAL LOW (ref >60)
Glucose, Bld: 139 mg/dL — ABNORMAL HIGH (ref 70–99)
Potassium: 5 mmol/L (ref 3.5–5.1)
Sodium: 133 mmol/L — ABNORMAL LOW (ref 135–145)

## 2021-05-17 LAB — CBC
HCT: 28.2 % — ABNORMAL LOW (ref 36.0–46.0)
HCT: 28.2 % — ABNORMAL LOW (ref 36.0–46.0)
Hemoglobin: 8.4 g/dL — ABNORMAL LOW (ref 12.0–15.0)
Hemoglobin: 8.2 g/dL — ABNORMAL LOW (ref 12.0–15.0)
MCH: 29.7 pg (ref 26.0–34.0)
MCH: 29.3 pg (ref 26.0–34.0)
MCHC: 29.8 g/dL — ABNORMAL LOW (ref 30.0–36.0)
MCHC: 29.1 g/dL — ABNORMAL LOW (ref 30.0–36.0)
MCV: 99.6 fL (ref 80.0–100.0)
MCV: 100.7 fL — ABNORMAL HIGH (ref 80.0–100.0)
Platelets: 220 10*3/uL (ref 150–400)
Platelets: 205 10*3/uL (ref 150–400)
RBC: 2.83 MIL/uL — ABNORMAL LOW (ref 3.87–5.11)
RBC: 2.8 MIL/uL — ABNORMAL LOW (ref 3.87–5.11)
RDW: 15.9 % — ABNORMAL HIGH (ref 11.5–15.5)
RDW: 16 % — ABNORMAL HIGH (ref 11.5–15.5)
WBC: 14.3 10*3/uL — ABNORMAL HIGH (ref 4.0–10.5)
WBC: 11.5 10*3/uL — ABNORMAL HIGH (ref 4.0–10.5)
nRBC: 0.1 % (ref 0.0–0.2)
nRBC: 0 % (ref 0.0–0.2)

## 2021-05-17 LAB — GLUCOSE, CAPILLARY
Glucose-Capillary: 132 mg/dL — ABNORMAL HIGH (ref 70–99)
Glucose-Capillary: 127 mg/dL — ABNORMAL HIGH (ref 70–99)
Glucose-Capillary: 106 mg/dL — ABNORMAL HIGH (ref 70–99)
Glucose-Capillary: 96 mg/dL (ref 70–99)
Glucose-Capillary: 109 mg/dL — ABNORMAL HIGH (ref 70–99)
Glucose-Capillary: 191 mg/dL — ABNORMAL HIGH (ref 70–99)

## 2021-05-17 LAB — TROPONIN I (HIGH SENSITIVITY): Troponin I (High Sensitivity): 1843 ng/L (ref <18)

## 2021-05-17 LAB — PHOSPHORUS: Phosphorus: 8.2 mg/dL — ABNORMAL HIGH (ref 2.5–4.6)

## 2021-05-17 MED ORDER — INSULIN ASPART 100 UNIT/ML IJ SOLN
0.0000 [IU] | Freq: Three times a day (TID) | INTRAMUSCULAR | Status: DC
  Administered 2021-05-18 (×2): 2 [IU] via SUBCUTANEOUS
  Administered 2021-05-19 (×3): 1 [IU] via SUBCUTANEOUS
  Administered 2021-05-20: 2 [IU] via SUBCUTANEOUS
  Administered 2021-05-20: 1 [IU] via SUBCUTANEOUS
  Administered 2021-05-21: 2 [IU] via SUBCUTANEOUS
  Administered 2021-05-22 – 2021-05-23 (×2): 1 [IU] via SUBCUTANEOUS
  Administered 2021-05-23 – 2021-05-24 (×2): 2 [IU] via SUBCUTANEOUS

## 2021-05-17 MED ORDER — TIZANIDINE HCL 4 MG PO TABS
4.0000 mg | ORAL_TABLET | Freq: Every day | ORAL | Status: DC
  Administered 2021-05-17 – 2021-05-19 (×2): 4 mg via ORAL
  Filled 2021-05-17 (×3): qty 1

## 2021-05-17 MED ORDER — CHLORHEXIDINE GLUCONATE CLOTH 2 % EX PADS
6.0000 | MEDICATED_PAD | Freq: Every day | CUTANEOUS | Status: DC
  Administered 2021-05-17 – 2021-05-24 (×6): 6 via TOPICAL

## 2021-05-17 MED ORDER — INSULIN ASPART 100 UNIT/ML IJ SOLN
0.0000 [IU] | Freq: Every day | INTRAMUSCULAR | Status: DC
  Administered 2021-05-19: 2 [IU] via SUBCUTANEOUS

## 2021-05-17 MED ORDER — SODIUM CHLORIDE 0.9 % IV SOLN: INTRAVENOUS | Status: DC

## 2021-05-17 MED ORDER — CALCITRIOL 0.5 MCG PO CAPS
1.0000 ug | ORAL_CAPSULE | ORAL | Status: DC
  Administered 2021-05-18 – 2021-05-23 (×2): 1 ug via ORAL
  Filled 2021-05-17 (×3): qty 2

## 2021-05-17 MED ORDER — SODIUM CHLORIDE 0.9 % IV SOLN
62.5000 mg | INTRAVENOUS | Status: DC
  Administered 2021-05-23: 62.5 mg via INTRAVENOUS
  Filled 2021-05-17: qty 5

## 2021-05-17 MED ORDER — PERFLUTREN LIPID MICROSPHERE
1.0000 mL | INTRAVENOUS | Status: AC | PRN
  Administered 2021-05-17: 3 mL via INTRAVENOUS
  Filled 2021-05-17: qty 10

## 2021-05-17 MED ORDER — ASPIRIN 81 MG PO CHEW
81.0000 mg | CHEWABLE_TABLET | Freq: Every day | ORAL | Status: DC
  Administered 2021-05-18 – 2021-05-24 (×6): 81 mg via ORAL
  Filled 2021-05-17 (×7): qty 1

## 2021-05-17 MED ORDER — SODIUM CHLORIDE 0.9% FLUSH: 3.0000 mL | INTRAVENOUS | Status: DC | PRN

## 2021-05-17 MED ORDER — TIZANIDINE HCL 4 MG PO TABS
4.0000 mg | ORAL_TABLET | Freq: Once | ORAL | Status: AC
  Administered 2021-05-17: 4 mg via ORAL
  Filled 2021-05-17: qty 1

## 2021-05-17 MED ORDER — SODIUM CHLORIDE 0.9% FLUSH
3.0000 mL | Freq: Two times a day (BID) | INTRAVENOUS | Status: DC
  Administered 2021-05-17 – 2021-05-22 (×10): 3 mL via INTRAVENOUS

## 2021-05-17 MED ORDER — ACETAMINOPHEN 325 MG PO TABS
650.0000 mg | ORAL_TABLET | Freq: Four times a day (QID) | ORAL | Status: DC | PRN
  Administered 2021-05-17 – 2021-05-24 (×18): 650 mg via ORAL
  Filled 2021-05-17 (×18): qty 2

## 2021-05-17 MED ORDER — HEPARIN SODIUM (PORCINE) 1000 UNIT/ML IJ SOLN
INTRAMUSCULAR | Status: AC
  Filled 2021-05-17: qty 5

## 2021-05-17 MED ORDER — CHLORHEXIDINE GLUCONATE CLOTH 2 % EX PADS
6.0000 | MEDICATED_PAD | Freq: Every day | CUTANEOUS | Status: DC
  Administered 2021-05-18: 6 via TOPICAL

## 2021-05-17 MED ORDER — SENNOSIDES-DOCUSATE SODIUM 8.6-50 MG PO TABS
1.0000 | ORAL_TABLET | Freq: Two times a day (BID) | ORAL | Status: DC
  Administered 2021-05-17 – 2021-05-19 (×5): 1 via ORAL
  Filled 2021-05-17 (×6): qty 1

## 2021-05-17 MED ORDER — HYDROCODONE-ACETAMINOPHEN 5-325 MG PO TABS
1.0000 | ORAL_TABLET | Freq: Four times a day (QID) | ORAL | Status: DC | PRN
Start: 2021-05-17 — End: 2021-05-24
  Administered 2021-05-17 – 2021-05-24 (×19): 1 via ORAL
  Filled 2021-05-17 (×20): qty 1

## 2021-05-17 MED ORDER — SODIUM CHLORIDE 0.9 % IV SOLN: 250.0000 mL | INTRAVENOUS | Status: DC | PRN

## 2021-05-17 MED ORDER — ASPIRIN 81 MG PO CHEW
81.0000 mg | CHEWABLE_TABLET | ORAL | Status: AC
  Administered 2021-05-17: 81 mg via ORAL
  Filled 2021-05-17: qty 1

## 2021-05-17 NOTE — Consult Note (Addendum)
[facility] Consult: 9:37 AM 05/17/2021  LOS: 1 day    Referring Provider: Dr Claybon Jabs  Primary Care Physician:  Medicine, [facility] Primary Gastroenterologist:  unassigned, none    Reason for Consultation: Hematochezia, abdominal pain.   HPI: Courtney Gray is a 51 y.o. female.  PMH IDDM.  ESRD on HD TTS.  Hypertension.  Degenerative spine/disc disease.  Cardiomegaly.  Morbid obesity.  Chronic oxygen requirements 3 to 4 L 24/7.  No formal testing for OSA.  Chronic anemia.  Osteopenia. No previous EGD, colonoscopy or gastric emptying study  Evaluation about 3 weeks ago for SS chest pain with GI overtones in that chest symptoms at rest and with activity which showed some improvement with increase pantoprazole 40 daily to 40 bid.  SL nitro helped.  Troponins negative.  TTE with no wall motion abnormalities, normal EF.  Imdur added.  Patient has chronic constipation and that she has to strain to have bowel movements on a frequent basis.  However there are days when she will have 3 or 4 bowel movements over the period of a few hours in the morning, on dialysis day takes Imodium to avoid having to evacuate while on dialysis.  She has tried stool softeners but nothing additionally to treat constipation.  Her other strategy for constipation is fruit juices.  Occasional scant blood on tissue paper.  Occasional rectal pain.  On Tuesday morning as well as yesterday, Thursday, morning strained to have a bowel movement felt some rectal pain and produced a large amount of blood which turned the commode water entirely red.  She did not have a bowel movement on Wednesday or yet today, Friday.  Moderate severity, diffuse abdominal pain for a few days, worse yesterday morning w TTP on abdominal exam.  On rectal exam yesterday in  the ED had nonthrombosed hemorrhoids, brown/nonbloody stool.    Additional GI issue includes what she has been told is gastroparesis though there is never been any formal testing for this.  She has frequent nausea, reflux symptoms.  Last week her PPI was changed from once to twice a day which resulted in improving symptoms.  Labs show markedly elevated troponins 837 to 1843.   Cardiology had tentatively scheduled catheterization for today but because of the bleeding and potential need for DAPT, this has been postponed pending GI evaluation/opinion.  Lab abnormalities include sodium low 133.  Glucose 130s to 170s.  GFR 3.  Normal LFTs and lipase. Hgb 8.2, was between 10 and 11.5 in September through November 2022.  MCV 101.7, was 102 in fall 2022.  B12, folate, iron, iron sat normal.  Ferritin elevated and TIBC low in May 2022.  Lives in [place] with her daughter.  Previously lived in the [place] area.  Family history negative for peptic ulcer disease, GI bleed, anemia, colorectal cancer.  Both parents had kidney disease.    Past Medical History:  Diagnosis Date   Diabetes mellitus without complication (HCC)    High cholesterol    Hypertension    Renal disorder  Past Surgical History:  Procedure Laterality Date   AV FISTULA PLACEMENT     x2, unsuccessful.    IR FLUORO GUIDE CV LINE LEFT  12/14/2020   IR FLUORO GUIDE CV LINE LEFT  02/20/2021   IR FLUORO GUIDE CV LINE RIGHT  08/28/2020   IR FLUORO GUIDE CV LINE RIGHT  05/13/2021   IR PERC TUN PERIT CATH WO PORT S&I /IMAG  09/06/2020   IR PTA VENOUS EXCEPT DIALYSIS CIRCUIT  02/20/2021   IR PTA VENOUS EXCEPT DIALYSIS CIRCUIT  05/13/2021   IR US GUIDE VASC ACCESS LEFT  09/06/2020   IR VENOCAVAGRAM SVC  02/20/2021    Prior to Admission medications   Medication Sig Start Date End Date Taking? Authorizing Provider  acetaminophen (TYLENOL) 500 MG tablet Take 1,000 mg by mouth every 6 (six) hours as needed for moderate pain or  headache.   Yes [provider]  amLODipine (NORVASC) 10 MG tablet Take 1 tablet (10 mg total) by mouth daily. Patient taking differently: Take 10 mg by mouth daily as needed (Blood pressusre). 11/29/20  Yes Roxan Hockey, MD  aspirin EC 81 MG tablet Take 1 tablet (81 mg total) by mouth daily with breakfast. 11/28/20  Yes Emokpae, Courage, MD  atorvastatin (LIPITOR) 10 MG tablet Take 1 tablet (10 mg total) by mouth daily. Patient taking differently: Take 10 mg by mouth at bedtime. 11/28/20  Yes Emokpae, Courage, MD  calcium carbonate (TUMS SMOOTHIES) 750 MG chewable tablet Chew 750-1,500 mg by mouth See admin instructions. Take 1500 mg with each meal and 750 mg with each snack   Yes [provider]  carvedilol (COREG) 25 MG tablet Take 1 tablet (25 mg total) by mouth 2 (two) times daily. Patient taking differently: Take 25 mg by mouth 2 (two) times daily. Do not take on dialysis days Tuesday Thur. And Sat. Take as needed. Take if blood pressure is over 140 11/28/20  Yes Emokpae, Courage, MD  diphenhydrAMINE (BENADRYL) 25 MG tablet Take 25 mg by mouth daily as needed for allergies or sleep.   Yes [provider]  docusate sodium (COLACE) 100 MG capsule Take 100 mg by mouth 2 (two) times daily.   Yes [provider]  doxercalciferol (HECTOROL) 4 MCG/2ML injection Inject 2.5 mLs (5 mcg total) into the vein Every Tuesday,Thursday,and Saturday with dialysis. 09/01/20  Yes Enzo Bi, MD  gabapentin (NEURONTIN) 100 MG capsule Take 100-200 mg by mouth See admin instructions. Take 100 mg twice daily on non dialysis days.  Take 100 mg at 9am before dialysis. Take 100-200 mg at bedtime. May take a 100 mg in the evening as needed for pain. Tuesdays, Thursdays,and Saturdays. 04/20/19  Yes [provider]  glipiZIDE (GLUCOTROL XL) 2.5 MG 24 hr tablet Take 2.5 mg by mouth daily. 03/22/21  Yes [provider]  isosorbide mononitrate (IMDUR) 60 MG 24 hr tablet Take 1  tablet (60 mg total) by mouth daily. 04/23/21 04/23/22 Yes Emokpae, Courage, MD  LANTUS SOLOSTAR 100 UNIT/ML Solostar Pen Inject 12 Units into the skin at bedtime. 03/18/19  Yes [provider]  lisinopril (ZESTRIL) 20 MG tablet Take 1 tablet (20 mg total) by mouth daily. Patient taking differently: Take 20 mg by mouth daily as needed (Blood pressure). 11/28/20  Yes Emokpae, Courage, MD  loperamide (IMODIUM A-D) 2 MG tablet Take 4 mg by mouth as needed for diarrhea or loose stools.   Yes [provider]  nitroGLYCERIN (NITROSTAT) 0.4 MG SL tablet Place 0.4 mg  under the tongue every 5 (five) minutes as needed for chest pain.   Yes [provider]  pantoprazole (PROTONIX) 40 MG tablet Take 1 tablet (40 mg total) by mouth daily. 11/29/20  Yes Emokpae, Courage, MD  Phenol (CHLORASEPTIC MT) Use as directed 4 sprays in the mouth or throat 4 (four) times daily as needed (throat pain).   Yes [provider]  tiZANidine (ZANAFLEX) 4 MG tablet Take 4 mg by mouth every 8 (eight) hours. Take with gabapentin at same time 04/20/19  Yes [provider]    Scheduled Meds:  aspirin  81 mg Oral Daily   atorvastatin  10 mg Oral QHS   calcium carbonate  1,500 mg Oral TID WC   carvedilol  25 mg Oral 2 times per day on Sun Mon Wed Fri   carvedilol  25 mg Oral Q T,Th,Sa-HD   Chlorhexidine Gluconate Cloth  6 each Topical Daily   [START ON 05/18/2021] gabapentin  100 mg Oral Once per day on Tue Thu Sat   gabapentin  100 mg Oral 2 times per day on Sun Mon Wed Fri   insulin aspart  0-6 Units Subcutaneous Q6H   isosorbide mononitrate  60 mg Oral Daily   pantoprazole  40 mg Oral Daily   senna-docusate  1 tablet Oral BID   sodium chloride flush  3 mL Intravenous Q12H   Infusions:  sodium chloride     sodium chloride     PRN Meds: sodium chloride, acetaminophen, calcium carbonate, nitroGLYCERIN, ondansetron **OR** ondansetron (ZOFRAN) IV, sodium chloride flush, witch  hazel-glycerin   Allergies as of 05/16/2021 - Review Complete 05/16/2021  Allergen Reaction Noted   Benzonatate Other (See Comments) 04/20/2017   Hydralazine Other (See Comments) 04/20/2017   Amoxicillin Nausea And Vomiting 10/05/2019   Clonidine Other (See Comments) 11/18/2019   Morphine Itching 03/02/2017   Oxycodone Itching 03/02/2017    Family History  Problem Relation Age of Onset   Diabetes Mother    Diabetes Father     Social History   Socioeconomic History   Marital status: Single    Spouse name: Not on file   Number of children: Not on file   Years of education: Not on file   Highest education level: Not on file  Occupational History   Not on file  Tobacco Use   Smoking status: Never   Smokeless tobacco: Never  Vaping Use   Vaping Use: Never used  Substance and Sexual Activity   Alcohol use: Never   Drug use: Never   Sexual activity: Not on file  Other Topics Concern   Not on file  Social History Narrative   Not on file   Social Determinants of Health   Financial Resource Strain: Not on file  Food Insecurity: Not on file  Transportation Needs: Not on file  Physical Activity: Not on file  Stress: Not on file  Social Connections: Not on file  Intimate Partner Violence: Not on file    REVIEW OF SYSTEMS: Constitutional: Gets extremely fatigued following dialysis.  Because of this her dialysis sessions were prolonged from 4 to 4-1/2 hours.  Weight stable between 350 to 360 pounds where it fluctuates back-and-forth. ENT:  No nose bleeds Pulm: Stable dyspnea.  No active cough. CV:  No palpitations, no LE edema.  Substernal chest pain is not severe.  Occurs at rest and with her minor activity.  Patient is not able to exert herself heavily due to musculoskeletal issues. GU:  No hematuria,  no frequency Gyn.  Not entirely menopausal.  Last year she had 3 menstrual bleeds. GI: See HPI.  No dysphagia. Heme: Denies unusual bleeding or bruising. Transfusions:  Per HPI. Neuro: Suffers tremors in her hands she attributes to gabapentin and tizanidine.  No headaches, no peripheral tingling or numbness Derm: Dry skin with occasional pruritus. Endocrine:  No sweats or chills.  No polyuria or dysuria.  Patient does not check her blood sugars with any regularity and goes more than a week between checks.  She is not on sliding scale insulin. Immunization: Not queried. Travel:  None beyond local counties in last few months.    PHYSICAL EXAM: Vital signs in last 24 hours: Vitals:   05/16/21 2000 05/16/21 2133  BP: (!) 148/108 127/83  Pulse: 95 88  Resp: (!) 21 20  Temp:  98.8 F (37.1 C)  SpO2: 100% 98%   Wt Readings from Last 3 Encounters:  05/16/21 (!) 170.2 kg  04/23/21 (!) 168.5 kg  02/20/21 (!) (P) 165.4 kg    General: Alert.  Morbidly obese.  No acute distress.  Appears stated age. Head: No facial asymmetry or signs of head trauma. Eyes: No conjunctival pallor or scleral icterus.  EOMI. Ears: No hearing deficit. Nose: No congestion or discharge. Mouth: Missing teeth.  Moist, clear oral mucosa.  Tongue midline. Neck: Obese without obvious JVD.  No masses. Lungs: Diminished breath sounds bilaterally.  No labored breathing, adventitious sounds or dyspnea.  No cough. Heart: RRR.  No MRG.  S1, S2 present Abdomen: Obese, soft.  Mild to moderate diffuse tenderness without focus.  No HSM, masses, bruits, hernias appreciated..   Rectal: No digital exam but visual exam reveals nonthrombosed hemorrhoids.  There is a tiny nonbleeding focus/ulcer in one of the hemorrhoids.  Do not see obvious tears. Musc/Skeltl: No obvious joint deformities. Extremities: Nonpitting edema/swelling in the lower extremities. Neurologic: No obvious deficits though formal strength testing not performed.  Tremors not observed. Skin: Shallow, excoriated area on the mid right back from patient's scratching. Nodes: No cervical adenopathy. Psych: Anxious but pleasant,  appreciative of detailed explanation of issues.  Intake/Output from previous day: No intake/output data recorded. Intake/Output this shift: No intake/output data recorded.  LAB RESULTS: Recent Labs    05/16/21 1115 05/17/21 0628  WBC 12.7* 11.5*  HGB 8.5* 8.2*  HCT 28.9* 28.2*  PLT 217 205   BMET Lab Results  Component Value Date   NA 133 (L) 05/17/2021   NA 130 (L) 05/16/2021   NA 136 04/23/2021   K 5.0 05/17/2021   K 5.1 05/16/2021   K 5.5 (H) 04/23/2021   CL 95 (L) 05/17/2021   CL 93 (L) 05/16/2021   CL 99 04/23/2021   CO2 22 05/17/2021   CO2 24 05/16/2021   CO2 21 (L) 04/23/2021   GLUCOSE 139 (H) 05/17/2021   GLUCOSE 166 (H) 05/16/2021   GLUCOSE 173 (H) 04/23/2021   BUN 76 (H) 05/17/2021   BUN 71 (H) 05/16/2021   BUN 89 (H) 04/23/2021   CREATININE 15.64 (H) 05/17/2021   CREATININE 14.11 (H) 05/16/2021   CREATININE 14.54 (H) 04/23/2021   CALCIUM 8.9 05/17/2021   CALCIUM 9.0 05/16/2021   CALCIUM 8.6 (L) 04/23/2021   LFT Recent Labs    05/16/21 1115  PROT 7.9  ALBUMIN 3.5  AST 14*  ALT 17  ALKPHOS 69  BILITOT 0.7   PT/INR No results found for: INR, PROTIME Hepatitis Panel No results for input(s): HEPBSAG, HCVAB, HEPAIGM, HEPBIGM in  the last 72 hours. C-Diff No components found for: CDIFF Lipase     Component Value Date/Time   LIPASE 35 09/01/2020 1500    Drugs of Abuse  No results found for: LABOPIA, COCAINSCRNUR, LABBENZ, AMPHETMU, THCU, LABBARB   RADIOLOGY STUDIES: DG Chest Portable 1 View  Result Date: 05/16/2021 CLINICAL DATA:  IV access EXAM: PORTABLE CHEST 1 VIEW COMPARISON:  05/16/2021 FINDINGS: Left dialysis catheter with tip over the cavoatrial junction region, unchanged. New right central venous catheter is present with tip over the low SVC region. No pneumothorax. Cardiac enlargement. No vascular congestion, edema, or consolidation. Mediastinal contours appear intact. IMPRESSION: Appliances appear in satisfactory position.  Cardiac  enlargement. Electronically Signed   By: Lucienne Capers M.D.   On: 05/16/2021 19:30   DG Chest Port 1 View  Result Date: 05/16/2021 CLINICAL DATA:  Shortness of breath, chest pain EXAM: PORTABLE CHEST 1 VIEW COMPARISON:  Previous studies including the examination of 04/22/2021 FINDINGS: Transverse diameter of heart is increased. Central pulmonary vessels are prominent without signs of alveolar pulmonary edema. There is no focal pulmonary consolidation. Left lateral CP angle is indistinct. There is questionable minimal blunting of right lateral CP angle. There is no pneumothorax. IMPRESSION: Cardiomegaly. Central pulmonary vessels are prominent suggesting mild CHF. There are no signs of alveolar pulmonary edema or focal pulmonary consolidation. Small bilateral pleural effusions, more so on the left side. Electronically Signed   By: Elmer Picker M.D.   On: 05/16/2021 12:11   [place] EKG SITE RITE  Result Date: 05/16/2021 If Site Rite image not attached, placement could not be confirmed due to current cardiac rhythm.    IMPRESSION:     Hematochezia, abd pain.  No prior colonoscopy.  Differential includes ischemic colitis, diverticular bleeding, anal fissure, hemorrhoidal bleeding (though volume of bleeding and lack of significant previous rectal bleeding weighs against hemorrhoids as source).  Background GERD and or gastroparesis.  Symptoms improved with pantoprazole.  No formal testing (no GES or EGD).    NSTEMI.  Cardiac cath on hold due to hematochezia.  CXR with cardiomegaly, mild CHF, small pleural effusions.  Chronic anemia, macrocytic indices.  May 2022 elevated ferritin, low TIBC but otherwise normal iron/B12/folate studies.    Chronic oxygen requirements, 3 to 4 L 24/7.     ESRD.  HD TTS.  Dialysis patient for 5.5 years  IDDM     Anxiety.  Morbid obesity.    PLAN:       Per Dr Candis Schatz.  High risk for sedation required for colonoscopy.     Azucena Freed  05/17/2021,  9:37 AM Phone [phone]  I have taken a history, reviewed the chart and examined the patient. I performed a substantive portion of this encounter, including complete performance of at least one of the key components, in conjunction with the APP. I agree with the APP's note, impression and recommendations  51 year old female with history of morbid obesity, end-stage renal disease on dialysis, insulin-dependent diabetes who was admitted with symptoms of rectal bleeding, found to have significant troponin anemia and diagnosed with NSTEMI.  Cardiology is planning on doing a cardiac catheterization with possible stenting, but requests colonoscopy prior to stenting in the event there is a l high risk of bleeding lesion which would be difficult to control on dual antiplatelet therapy. The patient reports to me having a large bloody bowel movement on Tuesday, then no bowel movement on Wednesday, then passing a normal stool on Thursday.  Today she thought she  had to go the bathroom, but was unable to.  She had a small amount of blood on the toilet paper when she wiped.  Her hemoglobin has been stable for the past month, which suggest against a significant bleed.  The patient states she has been diagnosed with IBS and has issues with chronic abdominal pain, and although she had abdominal pain in the last few days, she said it is not significantly more severe than her usual.  She has significant rectal discomfort with the passage of stool and sometimes with just sitting. With regards to her NSTEMI, the patient denies chest pain or chest pressure, but states that when she gets up and walks around she does have a little heaviness in her chest and shortness of breath.  On my exam, there were external hemorrhoids.  I was not able to positively identify an anal fissure either visually or via palpation, but the patient had significant discomfort with palpation suspicious for a fissure.  The intermittent nature of her  bleeding is also more consistent with a benign anorectal source. I am less suspicious of diverticular bleeding given the patient's report of a normal bowel movement yesterday and the stable hemoglobin from a month ago.  I am less suspicious of ischemic colitis given the lack of severity of her abdominal pain and the lack of bloody stool in the last 48 hours. Under normal circumstances we would proceed with a colonoscopy tomorrow.  However, given the patient's NSTEMI and continuing rise in troponins, I think there needs to be an increased threshold for subjecting her to a sedated procedure.  Anesthesia from the colonoscopy can potentially precipitate fatal cardiac events in someone with significant coronary artery disease is untreated.  I discussed this patient's case with Dr. Stanford Breed and relayed to him my suspicion of a benign anorectal outlet source for her hematochezia and my concerns about doing a colonoscopy with an NSTEMI.  He understood my concerns, and also relayed his concerns about the potential for causing serious bleeding she requires intervention and is committed to dual antiplatelet therapy without possibility for cessation.  This is a very valid concern.   If she does not require an emergent catheterization, I suggested that we wait a little longer to see if she is going to show any further evidence of GI bleeding, and also see which directions her troponins are going.  If she has no bleeding overnight, I think her risk of having a significant GI bleed is low, and would recommend she proceed with catheterization prior to colonoscopy.  If she does have bleeding overnight, then we can proceed with a colonoscopy Sunday, assuming that her heart is felt to be stable enough.  Dr. Stanford Breed was amenable to watching and waiting another day.  In the meantime, we can get a CT of the abdomen which can assess for finding such as ischemic colitis.  Hematochezia, suspected anorectal - Observe today for  ongoing bleeding, recheck hemoglobin in the morning - If ongoing bleeding and drop in hemoglobin, plan for bowel prep tomorrow and colonoscopy on Sunday - Consider CT of the abdomen pelvis to assess for evidence of ischemic colitis   Eris Breck E. Candis Schatz, MD [facility]

## 2021-05-17 NOTE — Consult Note (Signed)
Renal Service Consult Note [facility]  Courtney Gray 05/17/2021 Courtney Blazing, MD Requesting Physician: Dr. Alfredia Ferguson Reason for Consult: ESRD pt w/ rectal bleeding and chest pain HPI: The patient is a 51 y.o. year-old w/ hx of DM2, ESRD on HD [name] TTS, HL, HTN , chronic resp failure on 3L O2 [place] at home, chronic pain who presented 2/09 w/ c/o rectal bleeding having 2 episodes, the 1st was 2 days ago and the 2nd was the day of presentation. Also had some chest pain and ^trop as below. In ED VSS, BP  160/ 88, Trop  837 > 1355. Hb was 8.5. Digital exam in ED showed hemorrhoids. Pt admitted last night w/ dx of NSTEMI, rectal bleeding. Last HD was on 2/07. Asked to see for dialysis.   Pt seen in room. On HD for 5 yrs, gets HD at [facility] on TTS schedule. Weighed here today and is "up 5 kg".  Has sig DOE, okay at rest though. Hard to lie flat.   ROS - denies CP, no joint pain, no HA, no blurry vision, no rash, no diarrhea, no nausea/ vomiting, no dysuria, no difficulty voiding   Past Medical History  Past Medical History:  Diagnosis Date   Diabetes mellitus without complication ([place])    High cholesterol    Hypertension    Renal disorder    Past Surgical History  Past Surgical History:  Procedure Laterality Date   AV FISTULA PLACEMENT     x2, unsuccessful.    IR FLUORO GUIDE CV LINE LEFT  12/14/2020   IR FLUORO GUIDE CV LINE LEFT  02/20/2021   IR FLUORO GUIDE CV LINE RIGHT  08/28/2020   IR FLUORO GUIDE CV LINE RIGHT  05/13/2021   IR PERC TUN PERIT CATH WO PORT S&I /IMAG  09/06/2020   IR PTA VENOUS EXCEPT DIALYSIS CIRCUIT  02/20/2021   IR PTA VENOUS EXCEPT DIALYSIS CIRCUIT  05/13/2021   IR US GUIDE VASC ACCESS LEFT  09/06/2020   IR VENOCAVAGRAM SVC  02/20/2021   Family History  Family History  Problem Relation Age of Onset   Diabetes Mother    Diabetes Father    Social History  reports that she has never smoked. She has never used smokeless tobacco. She reports  that she does not drink alcohol and does not use drugs. Allergies  Allergies  Allergen Reactions   Benzonatate Other (See Comments)    Hallucinations   Hydralazine Other (See Comments)    Hallucinations   Amoxicillin Nausea And Vomiting   Clonidine Other (See Comments)    Altered mental state   Morphine Itching   Oxycodone Itching   Home medications Prior to Admission medications   Medication Sig Start Date End Date Taking? Authorizing Provider  acetaminophen (TYLENOL) 500 MG tablet Take 1,000 mg by mouth every 6 (six) hours as needed for moderate pain or headache.   Yes [provider]  amLODipine (NORVASC) 10 MG tablet Take 1 tablet (10 mg total) by mouth daily. Patient taking differently: Take 10 mg by mouth daily as needed (Blood pressusre). 11/29/20  Yes Roxan Hockey, MD  aspirin EC 81 MG tablet Take 1 tablet (81 mg total) by mouth daily with breakfast. 11/28/20  Yes Emokpae, Courage, MD  atorvastatin (LIPITOR) 10 MG tablet Take 1 tablet (10 mg total) by mouth daily. Patient taking differently: Take 10 mg by mouth at bedtime. 11/28/20  Yes Emokpae, Courage, MD  calcium carbonate (TUMS SMOOTHIES) 750 MG chewable tablet  Chew 750-1,500 mg by mouth See admin instructions. Take 1500 mg with each meal and 750 mg with each snack   Yes [provider]  carvedilol (COREG) 25 MG tablet Take 1 tablet (25 mg total) by mouth 2 (two) times daily. Patient taking differently: Take 25 mg by mouth 2 (two) times daily. Do not take on dialysis days Tuesday Thur. And Sat. Take as needed. Take if blood pressure is over 140 11/28/20  Yes Emokpae, Courage, MD  diphenhydrAMINE (BENADRYL) 25 MG tablet Take 25 mg by mouth daily as needed for allergies or sleep.   Yes [provider]  docusate sodium (COLACE) 100 MG capsule Take 100 mg by mouth 2 (two) times daily.   Yes [provider]  doxercalciferol (HECTOROL) 4 MCG/2ML injection Inject 2.5 mLs (5 mcg total) into the vein  Every Tuesday,Thursday,and Saturday with dialysis. 09/01/20  Yes Enzo Bi, MD  gabapentin (NEURONTIN) 100 MG capsule Take 100-200 mg by mouth See admin instructions. Take 100 mg twice daily on non dialysis days.  Take 100 mg at 9am before dialysis. Take 100-200 mg at bedtime. May take a 100 mg in the evening as needed for pain. Tuesdays, Thursdays,and Saturdays. 04/20/19  Yes [provider]  glipiZIDE (GLUCOTROL XL) 2.5 MG 24 hr tablet Take 2.5 mg by mouth daily. 03/22/21  Yes [provider]  isosorbide mononitrate (IMDUR) 60 MG 24 hr tablet Take 1 tablet (60 mg total) by mouth daily. 04/23/21 04/23/22 Yes Emokpae, Courage, MD  LANTUS SOLOSTAR 100 UNIT/ML Solostar Pen Inject 12 Units into the skin at bedtime. 03/18/19  Yes [provider]  lisinopril (ZESTRIL) 20 MG tablet Take 1 tablet (20 mg total) by mouth daily. Patient taking differently: Take 20 mg by mouth daily as needed (Blood pressure). 11/28/20  Yes Emokpae, Courage, MD  loperamide (IMODIUM A-D) 2 MG tablet Take 4 mg by mouth as needed for diarrhea or loose stools.   Yes [provider]  nitroGLYCERIN (NITROSTAT) 0.4 MG SL tablet Place 0.4 mg under the tongue every 5 (five) minutes as needed for chest pain.   Yes [provider]  pantoprazole (PROTONIX) 40 MG tablet Take 1 tablet (40 mg total) by mouth daily. 11/29/20  Yes Emokpae, Courage, MD  Phenol (CHLORASEPTIC MT) Use as directed 4 sprays in the mouth or throat 4 (four) times daily as needed (throat pain).   Yes [provider]  tiZANidine (ZANAFLEX) 4 MG tablet Take 4 mg by mouth every 8 (eight) hours. Take with gabapentin at same time 04/20/19  Yes [provider]     Vitals:   05/16/21 2000 05/16/21 2133 05/17/21 1139 05/17/21 1246  BP: (!) 148/108 127/83 (!) 177/67 (!) 135/56  Pulse: 95 88 69 77  Resp: (!) 21 20 20 20   Temp:  98.8 F (37.1 C) 97.7 F (36.5 C) 98.6 F (37 C)  TempSrc:  Oral Oral Oral  SpO2: 100%  98%  100%  Weight:  (!) 170.2 kg    Height:  5\' 5"  (1.651 m)     Exam Gen alert, no distress No rash, cyanosis or gangrene Sclera anicteric, throat clear  No jvd or bruits Chest faint rales R base, L clear RRR no MRG Abd soft ntnd no mass or ascites +bs obese GU deferred MS no joint effusions or deformity Ext no LE or UE edema, no wounds or ulcers Neuro is alert, Ox 3 , nf  L IJ TDC, old nonfunctioning RUE AVF  Home meds include - norvasc 10, coreg, lipitor, neurontin, glipizide , imdur, lantus insulin, lisinopril 20mg  prn, sl ntg prn, protonix, prns/ vits/ supp    OP HD: TTS [name]  4h 47min   400/500  165kg  1K/2.5Ca bath  TDC /(not using AVF) Hep 2600 + 1500 unit /hr  - rocaltrol 1.0 ug tiw  - mircera 200 ug q 2 wks, last 2/03  - venofer 50 mg weekly      CXR 2/09 - borderline edema    Na 133  K 5.0  BUN 76  Cr 15.6  CO2 22  CA 8.9    WBC 11K     HB 8.4, 8.2  Assessment/ Plan: NSTEMI - per cards/ pmd DOE - w/ borderline edema per CXR. Up 5kg by wts. Plan HD this evening. ESRD - on HD TTS.  Last HD was 2/07. HD tonight. Due to staffing issues we are doing stable pts only 2 times this week.  DM2 - per pmd Anemia ckd - Hb 8's, next esa due on 2/17. Cont weekly IV fe.  MBD ckd - Ca in range, add on phos. Cont vdra, ?binder.        Kelly Splinter  MD 05/17/2021, 2:28 PM  Recent Labs  Lab 05/17/21 0053 05/17/21 0628  WBC 14.3* 11.5*  HGB 8.4* 8.2*   Recent Labs  Lab 05/16/21 1115 05/17/21 0628  K 5.1 5.0  BUN 71* 76*  CREATININE 14.11* 15.64*  ALBUMIN 3.5  --   CALCIUM 9.0 8.9

## 2021-05-17 NOTE — Progress Notes (Signed)
Patient taken to HD via bed at 1720hrs.

## 2021-05-17 NOTE — Progress Notes (Signed)
Patient remains in HD at change of shift, report given to Rayford Halsted RN.

## 2021-05-17 NOTE — Progress Notes (Signed)
Patient states she does not want to sign consent for cardiac catheterization at this time, she would like to talk to doctor about plan.  States she is concerned about HD not being done.  Cardiology notified.

## 2021-05-17 NOTE — Assessment & Plan Note (Signed)
-  Complicates overall prognosis and care -Estimated body mass index is 62.33 kg/m as calculated from the following:   Height as of this encounter: 5\' 5"  (1.651 m).   Weight as of this encounter: 169.9 kg.  -Weight Loss and Dietary Counseling given

## 2021-05-17 NOTE — Progress Notes (Addendum)
Progress Note  Patient Name: Courtney Gray Date of Encounter: 05/17/2021  [facility] Cardiologist: Carlyle Dolly  Subjective   No CP or dyspnea  Inpatient Medications    Scheduled Meds:  atorvastatin  10 mg Oral QHS   calcium carbonate  1,500 mg Oral TID WC   carvedilol  25 mg Oral 2 times per day on Sun Mon Wed Fri   carvedilol  25 mg Oral Q T,Th,Sa-HD   Chlorhexidine Gluconate Cloth  6 each Topical Daily   [START ON 05/18/2021] gabapentin  100 mg Oral Once per day on Tue Thu Sat   gabapentin  100 mg Oral 2 times per day on Sun Mon Wed Fri   insulin aspart  0-6 Units Subcutaneous Q6H   isosorbide mononitrate  60 mg Oral Daily   pantoprazole  40 mg Oral Daily   sodium chloride flush  3 mL Intravenous Q12H   Continuous Infusions:  sodium chloride     sodium chloride     PRN Meds: sodium chloride, acetaminophen, calcium carbonate, nitroGLYCERIN, ondansetron **OR** ondansetron (ZOFRAN) IV, sodium chloride flush, witch hazel-glycerin   Vital Signs    Vitals:   05/16/21 1930 05/16/21 1937 05/16/21 2000 05/16/21 2133  BP: (!) 145/64  (!) 148/108 127/83  Pulse: 83  95 88  Resp: 14  (!) 21 20  Temp:  98.8 F (37.1 C)  98.8 F (37.1 C)  TempSrc:  Oral  Oral  SpO2: 100%  100% 98%  Weight:    (!) 170.2 kg  Height:    5\' 5"  (1.651 m)   No intake or output data in the 24 hours ending 05/17/21 0831 Last 3 Weights 05/16/2021 05/16/2021 04/23/2021  Weight (lbs) 375 lb 3.6 oz 371 lb 7.6 oz 371 lb 7.6 oz  Weight (kg) 170.2 kg 168.5 kg 168.5 kg      Telemetry    Sinus - Personally Reviewed  Physical Exam   GEN: No acute distress.  Morbidly obese Neck: No JVD Cardiac: RRR, no murmurs, rubs, or gallops.  Respiratory: Clear to auscultation bilaterally. GI: Soft, nontender, non-distended  MS: No edema Neuro:  Nonfocal  Psych: Normal affect   Labs    High Sensitivity Troponin:   Recent Labs  Lab 04/22/21 0211 04/23/21 0453 05/16/21 1345 05/16/21 1615  05/17/21 0053  TROPONINIHS 56* 56* 837* 1,355* 1,843*     Chemistry Recent Labs  Lab 05/16/21 1115 05/17/21 0628  NA 130* 133*  K 5.1 5.0  CL 93* 95*  CO2 24 22  GLUCOSE 166* 139*  BUN 71* 76*  CREATININE 14.11* 15.64*  CALCIUM 9.0 8.9  PROT 7.9  --   ALBUMIN 3.5  --   AST 14*  --   ALT 17  --   ALKPHOS 69  --   BILITOT 0.7  --   GFRNONAA 3* 3*  ANIONGAP 13 16*     Hematology Recent Labs  Lab 05/16/21 1115 05/17/21 0628  WBC 12.7* 11.5*  RBC 2.87* 2.80*  HGB 8.5* 8.2*  HCT 28.9* 28.2*  MCV 100.7* 100.7*  MCH 29.6 29.3  MCHC 29.4* 29.1*  RDW 15.5 16.0*  PLT 217 205    Radiology    DG Chest Portable 1 View  Result Date: 05/16/2021 CLINICAL DATA:  IV access EXAM: PORTABLE CHEST 1 VIEW COMPARISON:  05/16/2021 FINDINGS: Left dialysis catheter with tip over the cavoatrial junction region, unchanged. New right central venous catheter is present with tip over the low SVC region. No pneumothorax. Cardiac enlargement.  No vascular congestion, edema, or consolidation. Mediastinal contours appear intact. IMPRESSION: Appliances appear in satisfactory position.  Cardiac enlargement. Electronically Signed   By: Lucienne Capers M.D.   On: 05/16/2021 19:30   DG Chest Port 1 View  Result Date: 05/16/2021 CLINICAL DATA:  Shortness of breath, chest pain EXAM: PORTABLE CHEST 1 VIEW COMPARISON:  Previous studies including the examination of 04/22/2021 FINDINGS: Transverse diameter of heart is increased. Central pulmonary vessels are prominent without signs of alveolar pulmonary edema. There is no focal pulmonary consolidation. Left lateral CP angle is indistinct. There is questionable minimal blunting of right lateral CP angle. There is no pneumothorax. IMPRESSION: Cardiomegaly. Central pulmonary vessels are prominent suggesting mild CHF. There are no signs of alveolar pulmonary edema or focal pulmonary consolidation. Small bilateral pleural effusions, more so on the left side.  Electronically Signed   By: Elmer Picker M.D.   On: 05/16/2021 12:11   [place] EKG SITE RITE  Result Date: 05/16/2021 If Site Rite image not attached, placement could not be confirmed due to current cardiac rhythm.   Patient Profile     51 y.o. female with past medical history of end-stage renal disease dialysis dependent, hypertension, diabetes mellitus, hyperlipidemia with non-ST elevation myocardial infarction.  Assessment & Plan    1 non-ST elevation myocardial infarction-patient is pain-free.  Plan to continue aspirin, carvedilol and statin.  Patient is not on heparin given recent rectal bleeding.  Plan ultimately is for cardiac catheterization.  However need GI input concerning use of heparin and dual antiplatelet therapy given ongoing rectal bleeding.  We will plan catheterization pending their evaluation.  2 rectal bleeding-GI evaluation today.  Hemoglobin unchanged.  3 end-stage renal disease-we will need nephrology for dialysis.  4 anemia-likely secondary to renal insufficiency with possible contribution from rectal bleeding.  Hemoglobin unchanged.  5 hypertension-continue carvedilol and isosorbide.  Follow blood pressure and adjust regimen as needed.  6 morbid obesity-needs weight loss.  For questions or updates, please contact [place] Please consult [email or web address] for contact info under        Signed, Kirk Ruths, MD  05/17/2021, 8:31 AM

## 2021-05-17 NOTE — Assessment & Plan Note (Addendum)
Hemoglobin A1c 8.9 on 04/23/2021, poorly controlled.  Home regimen includes Lantus 12 units subcutaneously daily, glipizide 2.5 mg p.o. daily.

## 2021-05-17 NOTE — Assessment & Plan Note (Addendum)
Patient dialyzes on a Tuesday/Thursday/Saturday schedule outpatient.  Patient had HD catheter exchanged on 05/24/2021.  Continue outpatient hemodialysis as scheduled.

## 2021-05-17 NOTE — Progress Notes (Signed)
°   05/17/21 1155  Clinical Encounter Type  Visited With Patient and family together  Visit Type Initial;Spiritual support  Referral From Nurse  Consult/Referral To None   Chaplain responded to a spiritual consult for prayer. Patient has received a lot of information which she was trying to process. Family is with her and we gathered in prayer asking for clarity in thought for all involved in this process.    [place] Resident [facility]  [phone]
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
## 2021-05-17 NOTE — Assessment & Plan Note (Addendum)
Home regimen includes carvedilol, Imdur, amlodipine, lisinopril.  Continue carvedilol 12 mg p.o. twice daily on nondialysis days and may take on dialysis days following HD if SBP greater than 140 per cardiology.  Discontinued home Imdur and lisinopril.  Continue amlodipine as needed.  Continue aspirin and statin.  Outpatient follow-up with cardiology.

## 2021-05-17 NOTE — Assessment & Plan Note (Addendum)
Patient reported episodes of rectal bleeding on admission, onset 2 days prior to hospitalization.  Patient with history of IBS, constipation predominant with EDP findings of hemorrhoids on rectal examination.  No previous history of colonoscopy in the past.  See abdomen/pelvis without contrast with no findings to explain localized rectal bleeding.  Seen by [facility]; and to defer colonoscopy to outpatient. Outpatient follow-up scheduled with GI, Dr. Candis Schatz on 06/11/2021.

## 2021-05-17 NOTE — Assessment & Plan Note (Addendum)
Continue home 2 L per nasal cannula

## 2021-05-17 NOTE — Assessment & Plan Note (Addendum)
Anemia panel iron level 25, TIBC 179, saturation ratios of 14%, ferritin level of 833, 4 level 9.2, vitamin B12 642. Nephrology plans ferric gluconate 62.5 mg IV every Thursday

## 2021-05-17 NOTE — Progress Notes (Signed)
Telephone consult with Volney Presser, pt had central line placed at [facility]. Documentation is labeled PICC but placed in chest. Informed RN it is OK to document on line as entered, it is clear on documentation line is placed in chest.

## 2021-05-17 NOTE — Hospital Course (Addendum)
Courtney Gray is a 51 year old female with past medical history significant for ESRD on HD TTS, type 2 diabetes mellitus, essential hypertension, hyperlipidemia, neuropathy, IBS, anemia of chronic renal disease, morbid obesity who presented to the ED on 2/9 with reported rectal bleeding, persistent chest pain.  Patient reports 2 episodes of bleeding with no presence of clots, bright red in color.  Denies black stools and denies NSAID abuse.  No history of colonoscopy in the past.  No recent medication changes.  In addition, patient with continued chest pain since recent discharge, midsternal and present both at rest and with exertion; nonradiating with associated shortness of breath.  Denies lower extremity edema.  In the ED, temperature 98.8 F, HR 72, RR 25, SBP 160.  SPO2 100% on room air.  Sodium 130, potassium 5.1, chloride 93, CO2 24, glucose 166, BUN 71, creatinine 14.11, AST 14, ALT 17, total bilirubin 0.7.  WBC 12.7, hemoglobin 8.5, platelets 217.  COVID-19 PCR negative.  Influenza A/B PCR negative.  Chest x-ray with cardiomegaly, no alveolar pulmonary edema or focal consolidation.  EDP performed rectal exam with findings of hemorrhoids.  Cardiology was consulted and recommended transfer to [facility] for further evaluation and GI consultation for GI bleeding.  TRH consulted for further evaluation and management of rectal bleeding and chest pain.

## 2021-05-17 NOTE — Assessment & Plan Note (Addendum)
Body mass index is 60.83 kg/m.  Discussed with patient needs for aggressive lifestyle changes/weight loss as this complicates all facets of care. Outpatient follow-up with PCP.  May benefit from bariatric evaluation outpatient.

## 2021-05-17 NOTE — Assessment & Plan Note (Addendum)
Left foot x-ray with no fracture/dislocation, plantar spur noted calcaneus with no focal lytic lesions.  Uric acid level 4.6, within normal limits. Continue home gabapentin.

## 2021-05-17 NOTE — Progress Notes (Signed)
I discussed the patient with Dr. Candis Schatz.  He feels this was likely a hemorrhoidal bleed but patient does eventually require colonoscopy.  She will be high risk given her size and recent non-ST elevation myocardial infarction.  We have agreed that we will watch her over the weekend for any recurrent GI bleeding.  If she has recurrences then she may need colonoscopy prior to catheterization.  If no recurrences we will plan catheterization on Monday (if patient requires PCI she will need to be on dual antiplatelet therapy).  If she has issues over the weekend then we may need to proceed on a more urgent basis. Kirk Ruths, MD

## 2021-05-17 NOTE — Assessment & Plan Note (Addendum)
Patient presenting with persistent chest pain, constant, nonradiating localized to her anterior chest associated with shortness of breath. hs troponin 837>1355>1843>652>625>302>326.  TTE 2/10 with LVEF 55-60%, LV normal function, no regional wall motion abnormalities, moderate concentric LVH, grade 2 diastolic dysfunction, LA mildly dilated, moderate/severe mitral valve alveolar calcification with mild MV regurgitation, mild aortic valve stenosis.  Cardiology was consulted and followed during hospital course, now signed off with recommendations of outpatient follow-up; with deferment of heart catheterization outpatient.  Continue carvedilol 25 mg p.o. twice daily on nondialysis days and may take during HD days following HD if SBP greater than 140 per cardiology.  Outpatient follow-up with cardiology in 3 to 4 weeks.  Continue aspirin and statin.

## 2021-05-18 DIAGNOSIS — K625 Hemorrhage of anus and rectum: Secondary | ICD-10-CM

## 2021-05-18 DIAGNOSIS — I10 Essential (primary) hypertension: Secondary | ICD-10-CM

## 2021-05-18 DIAGNOSIS — N186 End stage renal disease: Secondary | ICD-10-CM | POA: Diagnosis not present

## 2021-05-18 DIAGNOSIS — I214 Non-ST elevation (NSTEMI) myocardial infarction: Secondary | ICD-10-CM | POA: Diagnosis not present

## 2021-05-18 DIAGNOSIS — N189 Chronic kidney disease, unspecified: Secondary | ICD-10-CM | POA: Diagnosis not present

## 2021-05-18 DIAGNOSIS — K649 Unspecified hemorrhoids: Secondary | ICD-10-CM | POA: Diagnosis not present

## 2021-05-18 DIAGNOSIS — N185 Chronic kidney disease, stage 5: Secondary | ICD-10-CM | POA: Diagnosis not present

## 2021-05-18 DIAGNOSIS — E785 Hyperlipidemia, unspecified: Secondary | ICD-10-CM | POA: Diagnosis present

## 2021-05-18 DIAGNOSIS — D631 Anemia in chronic kidney disease: Secondary | ICD-10-CM

## 2021-05-18 LAB — CBC WITH DIFFERENTIAL/PLATELET
Abs Immature Granulocytes: 0.07 10*3/uL (ref 0.00–0.07)
Basophils Absolute: 0 10*3/uL (ref 0.0–0.1)
Basophils Relative: 0 %
Eosinophils Absolute: 0.4 10*3/uL (ref 0.0–0.5)
Eosinophils Relative: 3 %
HCT: 26.1 % — ABNORMAL LOW (ref 36.0–46.0)
Hemoglobin: 7.9 g/dL — ABNORMAL LOW (ref 12.0–15.0)
Immature Granulocytes: 1 %
Lymphocytes Relative: 10 %
Lymphs Abs: 1.1 10*3/uL (ref 0.7–4.0)
MCH: 30.2 pg (ref 26.0–34.0)
MCHC: 30.3 g/dL (ref 30.0–36.0)
MCV: 99.6 fL (ref 80.0–100.0)
Monocytes Absolute: 0.9 10*3/uL (ref 0.1–1.0)
Monocytes Relative: 8 %
Neutro Abs: 8.9 10*3/uL — ABNORMAL HIGH (ref 1.7–7.7)
Neutrophils Relative %: 78 %
Platelets: 187 10*3/uL (ref 150–400)
RBC: 2.62 MIL/uL — ABNORMAL LOW (ref 3.87–5.11)
RDW: 16.1 % — ABNORMAL HIGH (ref 11.5–15.5)
WBC: 11.4 10*3/uL — ABNORMAL HIGH (ref 4.0–10.5)
nRBC: 0 % (ref 0.0–0.2)

## 2021-05-18 LAB — COMPREHENSIVE METABOLIC PANEL
ALT: 14 U/L (ref 0–44)
AST: 14 U/L — ABNORMAL LOW (ref 15–41)
Albumin: 3.1 g/dL — ABNORMAL LOW (ref 3.5–5.0)
Alkaline Phosphatase: 59 U/L (ref 38–126)
Anion gap: 12 (ref 5–15)
BUN: 47 mg/dL — ABNORMAL HIGH (ref 6–20)
CO2: 25 mmol/L (ref 22–32)
Calcium: 8.6 mg/dL — ABNORMAL LOW (ref 8.9–10.3)
Chloride: 97 mmol/L — ABNORMAL LOW (ref 98–111)
Creatinine, Ser: 11.57 mg/dL — ABNORMAL HIGH (ref 0.44–1.00)
GFR, Estimated: 4 mL/min — ABNORMAL LOW (ref >60)
Glucose, Bld: 236 mg/dL — ABNORMAL HIGH (ref 70–99)
Potassium: 4.7 mmol/L (ref 3.5–5.1)
Sodium: 134 mmol/L — ABNORMAL LOW (ref 135–145)
Total Bilirubin: 0.3 mg/dL (ref 0.3–1.2)
Total Protein: 7 g/dL (ref 6.5–8.1)

## 2021-05-18 LAB — TROPONIN I (HIGH SENSITIVITY)
Troponin I (High Sensitivity): 652 ng/L (ref <18)
Troponin I (High Sensitivity): 625 ng/L (ref <18)

## 2021-05-18 LAB — MAGNESIUM: Magnesium: 2.3 mg/dL (ref 1.7–2.4)

## 2021-05-18 LAB — LIPID PANEL
Cholesterol: 107 mg/dL (ref 0–200)
HDL: 23 mg/dL — ABNORMAL LOW (ref >40)
LDL Cholesterol: 40 mg/dL (ref 0–99)
Total CHOL/HDL Ratio: 4.7 RATIO
Triglycerides: 222 mg/dL — ABNORMAL HIGH (ref <150)
VLDL: 44 mg/dL — ABNORMAL HIGH (ref 0–40)

## 2021-05-18 LAB — GLUCOSE, CAPILLARY
Glucose-Capillary: 216 mg/dL — ABNORMAL HIGH (ref 70–99)
Glucose-Capillary: 221 mg/dL — ABNORMAL HIGH (ref 70–99)
Glucose-Capillary: 138 mg/dL — ABNORMAL HIGH (ref 70–99)
Glucose-Capillary: 206 mg/dL — ABNORMAL HIGH (ref 70–99)

## 2021-05-18 LAB — IRON AND TIBC
Iron: 25 ug/dL — ABNORMAL LOW (ref 28–170)
Saturation Ratios: 14 % (ref 10.4–31.8)
TIBC: 179 ug/dL — ABNORMAL LOW (ref 250–450)
UIBC: 154 ug/dL

## 2021-05-18 LAB — PHOSPHORUS: Phosphorus: 6.2 mg/dL — ABNORMAL HIGH (ref 2.5–4.6)

## 2021-05-18 LAB — FERRITIN: Ferritin: 833 ng/mL — ABNORMAL HIGH (ref 11–307)

## 2021-05-18 LAB — RETICULOCYTES
Immature Retic Fract: 43.4 % — ABNORMAL HIGH (ref 2.3–15.9)
RBC.: 2.61 MIL/uL — ABNORMAL LOW (ref 3.87–5.11)
Retic Count, Absolute: 134.4 10*3/uL (ref 19.0–186.0)
Retic Ct Pct: 5.2 % — ABNORMAL HIGH (ref 0.4–3.1)

## 2021-05-18 LAB — VITAMIN B12: Vitamin B-12: 642 pg/mL (ref 180–914)

## 2021-05-18 LAB — FOLATE: Folate: 9.2 ng/mL (ref >5.9)

## 2021-05-18 MED ORDER — HEPARIN SODIUM (PORCINE) 1000 UNIT/ML IJ SOLN
INTRAMUSCULAR | Status: AC
  Administered 2021-05-18: 3200 [IU]
  Filled 2021-05-18: qty 3

## 2021-05-18 NOTE — Progress Notes (Addendum)
Progress Note  Patient Name: Courtney Gray Date of Encounter: 05/18/2021  [facility] Cardiologist: Carlyle Dolly  Subjective   Complains of some chest discomfort this morning after she sat up.  She says it hurts worse when she takes a deep breath then and thinks she might have slept wrong on her side.  She does not know if it was similar to what she came in with.  She denies any  shortness of breath .  Still having some hemorrhoidal bleeding with mild blood in the toilet and some of the more falls when wiping  Inpatient Medications    Scheduled Meds:  aspirin  81 mg Oral Daily   atorvastatin  10 mg Oral QHS   calcitRIOL  1 mcg Oral Q T,Th,Sa-HD   calcium carbonate  1,500 mg Oral TID WC   carvedilol  25 mg Oral 2 times per day on Sun Mon Wed Fri   carvedilol  25 mg Oral Q T,Th,Sa-HD   Chlorhexidine Gluconate Cloth  6 each Topical Daily   Chlorhexidine Gluconate Cloth  6 each Topical Q0600   gabapentin  100 mg Oral Once per day on Tue Thu Sat   gabapentin  100 mg Oral 2 times per day on Sun Mon Wed Fri   insulin aspart  0-5 Units Subcutaneous QHS   insulin aspart  0-6 Units Subcutaneous TID WC   isosorbide mononitrate  60 mg Oral Daily   pantoprazole  40 mg Oral Daily   senna-docusate  1 tablet Oral BID   sodium chloride flush  3 mL Intravenous Q12H   tiZANidine  4 mg Oral Daily   Continuous Infusions:  sodium chloride     sodium chloride     [START ON 05/23/2021] ferric gluconate (FERRLECIT) IVPB     PRN Meds: sodium chloride, acetaminophen, calcium carbonate, HYDROcodone-acetaminophen, nitroGLYCERIN, ondansetron **OR** ondansetron (ZOFRAN) IV, sodium chloride flush, witch hazel-glycerin   Vital Signs    Vitals:   05/17/21 2100 05/17/21 2134 05/17/21 2234 05/18/21 0400  BP: (!) 155/43 (!) 149/53 (!) 157/49 (!) 137/41  Pulse: 75 74 74 68  Resp: 17 18  18   Temp: 97.8 F (36.6 C) 98.4 F (36.9 C)  97.9 F (36.6 C)  TempSrc: Temporal Oral  Oral  SpO2: 100%  100% 98% 99%  Weight: (!) 167.9 kg (!) 168.3 kg    Height:        Intake/Output Summary (Last 24 hours) at 05/18/2021 7591 Last data filed at 05/17/2021 2100 Gross per 24 hour  Intake 240 ml  Output 1895 ml  Net -1655 ml   Last 3 Weights 05/17/2021 05/17/2021 05/17/2021  Weight (lbs) 371 lb 0.6 oz 370 lb 2.4 oz 374 lb 9 oz  Weight (kg) 168.3 kg 167.9 kg 169.9 kg      Telemetry    NSR - Personally Reviewed  Physical Exam   GEN: Well nourished, well developed in no acute distress HEENT: Normal NECK: No JVD; No carotid bruits LYMPHATICS: No lymphadenopathy CARDIAC:RRR, no murmurs, rubs, gallops RESPIRATORY:  Clear to auscultation without rales, wheezing or rhonchi  ABDOMEN: Soft, non-tender, non-distended MUSCULOSKELETAL:  No edema; No deformity  SKIN: Warm and dry NEUROLOGIC:  Alert and oriented x 3 PSYCHIATRIC:  Normal affect   Labs    High Sensitivity Troponin:   Recent Labs  Lab 04/22/21 0211 04/23/21 0453 05/16/21 1345 05/16/21 1615 05/17/21 0053  TROPONINIHS 56* 56* 837* 1,355* 1,843*      Chemistry Recent Labs  Lab 05/16/21 1115 05/17/21  1884 05/18/21 0148  NA 130* 133* 134*  K 5.1 5.0 4.7  CL 93* 95* 97*  CO2 24 22 25   GLUCOSE 166* 139* 236*  BUN 71* 76* 47*  CREATININE 14.11* 15.64* 11.57*  CALCIUM 9.0 8.9 8.6*  MG  --   --  2.3  PROT 7.9  --  7.0  ALBUMIN 3.5  --  3.1*  AST 14*  --  14*  ALT 17  --  14  ALKPHOS 69  --  59  BILITOT 0.7  --  0.3  GFRNONAA 3* 3* 4*  ANIONGAP 13 16* 12      Hematology Recent Labs  Lab 05/17/21 0053 05/17/21 0628 05/18/21 0148 05/18/21 0149  WBC 14.3* 11.5* 11.4*  --   RBC 2.83* 2.80* 2.62* 2.61*  HGB 8.4* 8.2* 7.9*  --   HCT 28.2* 28.2* 26.1*  --   MCV 99.6 100.7* 99.6  --   MCH 29.7 29.3 30.2  --   MCHC 29.8* 29.1* 30.3  --   RDW 15.9* 16.0* 16.1*  --   PLT 220 205 187  --      Radiology    DG Chest Portable 1 View  Result Date: 05/16/2021 CLINICAL DATA:  IV access EXAM: PORTABLE CHEST 1  VIEW COMPARISON:  05/16/2021 FINDINGS: Left dialysis catheter with tip over the cavoatrial junction region, unchanged. New right central venous catheter is present with tip over the low SVC region. No pneumothorax. Cardiac enlargement. No vascular congestion, edema, or consolidation. Mediastinal contours appear intact. IMPRESSION: Appliances appear in satisfactory position.  Cardiac enlargement. Electronically Signed   By: Lucienne Capers M.D.   On: 05/16/2021 19:30   DG Chest Port 1 View  Result Date: 05/16/2021 CLINICAL DATA:  Shortness of breath, chest pain EXAM: PORTABLE CHEST 1 VIEW COMPARISON:  Previous studies including the examination of 04/22/2021 FINDINGS: Transverse diameter of heart is increased. Central pulmonary vessels are prominent without signs of alveolar pulmonary edema. There is no focal pulmonary consolidation. Left lateral CP angle is indistinct. There is questionable minimal blunting of right lateral CP angle. There is no pneumothorax. IMPRESSION: Cardiomegaly. Central pulmonary vessels are prominent suggesting mild CHF. There are no signs of alveolar pulmonary edema or focal pulmonary consolidation. Small bilateral pleural effusions, more so on the left side. Electronically Signed   By: Elmer Picker M.D.   On: 05/16/2021 12:11   DG Foot 2 Views Left  Result Date: 05/17/2021 CLINICAL DATA:  Pain and swelling EXAM: LEFT FOOT - 2 VIEW COMPARISON:  None. FINDINGS: No fracture or dislocation is seen. Extensive arterial calcifications are seen. Plantar spur is seen in calcaneus. There is mild hallux valgus deformity. Cortical irregularity in the medial aspect of head of the first metatarsal may be residual from previous injury. IMPRESSION: No fracture or dislocation is seen. There are no focal lytic lesions. Plantar spur is seen in calcaneus. Electronically Signed   By: Elmer Picker M.D.   On: 05/17/2021 13:41   DG Foot 2 Views Right  Result Date: 05/17/2021 CLINICAL DATA:   Pain and swelling EXAM: RIGHT FOOT - 2 VIEW COMPARISON:  04/23/2021 FINDINGS: No fracture or dislocation is seen. Plantar spur is seen in calcaneus. Degenerative changes are noted in the intertarsal joints with bony spurs in the dorsal aspect. Extensive arterial calcifications are seen in the soft tissues. There is mild hallux valgus deformity. IMPRESSION: No fracture or dislocation is seen. There are no focal lytic lesions. Plantar spur is seen in calcaneus. Electronically  Signed   By: Elmer Picker M.D.   On: 05/17/2021 13:43   ECHOCARDIOGRAM COMPLETE  Result Date: 05/17/2021    ECHOCARDIOGRAM REPORT   Patient Name:   LONA SIX Date of Exam: 05/17/2021 Medical Rec #:  [id number]       Height:       65.0 in Accession #:    [id number]      Weight:       375.2 lb Date of Birth:  [birth year]        BSA:          2.585 m Patient Age:    51 years        BP:           127/83 mmHg Patient Gender: F               HR:           70 bpm. Exam Location:  Inpatient Procedure: 2D Echo, Cardiac Doppler, Color Doppler and Intracardiac            Opacification Agent Indications:    NSTEMI  History:        Patient has prior history of Echocardiogram examinations, most                 recent 04/23/2021. Risk Factors:Hypertension, Diabetes and                 Dyslipidemia. ESRD.  Sonographer:    Clayton Lefort RDCS (AE) Referring Phys: [id number] [facility]  Sonographer Comments: Patient is morbidly obese. Image acquisition challenging due to patient body habitus. IMPRESSIONS  1. Left ventricular ejection fraction, by estimation, is 55 to 60%. The left ventricle has normal function. The left ventricle has no regional wall motion abnormalities. There is moderate concentric left ventricular hypertrophy. Left ventricular diastolic parameters are consistent with Grade II diastolic dysfunction (pseudonormalization). Elevated left atrial pressure.  2. Right ventricular systolic function is normal. The right ventricular size is  normal. Tricuspid regurgitation signal is inadequate for assessing PA pressure.  3. Left atrial size was mildly dilated.  4. The mitral valve is degenerative. Mild mitral valve regurgitation. No evidence of mitral stenosis. The mean mitral valve gradient is 3.0 mmHg with average heart rate of 68 bpm. Moderate to severe mitral annular calcification.  5. The aortic valve is tricuspid. There is moderate calcification of the aortic valve. There is moderate thickening of the aortic valve. Aortic valve regurgitation is not visualized. Mild aortic valve stenosis. Aortic valve mean gradient measures 13.0 mmHg. Aortic valve Vmax measures 2.45 m/s. Comparison(s): Prior images reviewed side by side. There is now mild aortic stenosis. FINDINGS  Left Ventricle: Left ventricular ejection fraction, by estimation, is 55 to 60%. The left ventricle has normal function. The left ventricle has no regional wall motion abnormalities. Definity contrast agent was given IV to delineate the left ventricular  endocardial borders. The left ventricular internal cavity size was normal in size. There is moderate concentric left ventricular hypertrophy. Left ventricular diastolic parameters are consistent with Grade II diastolic dysfunction (pseudonormalization).  Elevated left atrial pressure. Right Ventricle: The right ventricular size is normal. No increase in right ventricular wall thickness. Right ventricular systolic function is normal. Tricuspid regurgitation signal is inadequate for assessing PA pressure. Left Atrium: Left atrial size was mildly dilated. Right Atrium: Right atrial size was normal in size. Pericardium: There is no evidence of pericardial effusion. Mitral Valve: The mitral valve is degenerative in appearance. Moderate to severe  mitral annular calcification. Mild mitral valve regurgitation. No evidence of mitral valve stenosis. MV peak gradient, 6.7 mmHg. The mean mitral valve gradient is 3.0 mmHg with average heart rate of  68 bpm. Tricuspid Valve: The tricuspid valve is normal in structure. Tricuspid valve regurgitation is trivial. Aortic Valve: The aortic valve is tricuspid. There is moderate calcification of the aortic valve. There is moderate thickening of the aortic valve. Aortic valve regurgitation is not visualized. Mild aortic stenosis is present. Aortic valve mean gradient measures 13.0 mmHg. Aortic valve peak gradient measures 24.0 mmHg. Aortic valve area, by VTI measures 1.58 cm. Pulmonic Valve: The pulmonic valve was grossly normal. Pulmonic valve regurgitation is not visualized. Aorta: The aortic root is normal in size and structure. IAS/Shunts: No atrial level shunt detected by color flow Doppler.  LEFT VENTRICLE PLAX 2D LVIDd:         5.00 cm   Diastology LVIDs:         3.40 cm   LV e' medial:    5.70 cm/s LV PW:         1.60 cm   LV E/e' medial:  20.7 LV IVS:        1.80 cm   LV e' lateral:   7.58 cm/s LVOT diam:     2.16 cm   LV E/e' lateral: 15.6 LV SV:         81 LV SV Index:   31 LVOT Area:     3.66 cm  RIGHT VENTRICLE RV Basal diam:  3.30 cm RV S prime:     17.70 cm/s TAPSE (M-mode): 3.3 cm LEFT ATRIUM              Index        RIGHT ATRIUM           Index LA diam:        3.00 cm  1.16 cm/m   RA Area:     14.60 cm LA Vol (A2C):   104.0 ml 40.24 ml/m  RA Volume:   37.00 ml  14.32 ml/m LA Vol (A4C):   67.4 ml  26.08 ml/m LA Biplane Vol: 83.6 ml  32.35 ml/m  AORTIC VALVE AV Area (Vmax):    1.47 cm AV Area (Vmean):   1.41 cm AV Area (VTI):     1.58 cm AV Vmax:           245.00 cm/s AV Vmean:          168.000 cm/s AV VTI:            0.516 m AV Peak Grad:      24.0 mmHg AV Mean Grad:      13.0 mmHg LVOT Vmax:         98.50 cm/s LVOT Vmean:        64.600 cm/s LVOT VTI:          0.222 m LVOT/AV VTI ratio: 0.43  AORTA Ao Root diam: 3.30 cm Ao Asc diam:  3.40 cm MITRAL VALVE MV Area (PHT): 2.41 cm     SHUNTS MV Peak grad:  6.7 mmHg     Systemic VTI:  0.22 m MV Mean grad:  3.0 mmHg     Systemic Diam: 2.16 cm MV  Vmax:       1.29 m/s MV Vmean:      83.9 cm/s MV Decel Time: 315 msec MV E velocity: 118.00 cm/s MV A velocity: 108.00 cm/s MV E/A ratio:  1.09 Mihai Croitoru  MD Electronically signed by Sanda Klein MD Signature Date/Time: 05/17/2021/1:29:46 PM    Final    [place] EKG SITE RITE  Result Date: 05/16/2021 If Site Rite image not attached, placement could not be confirmed due to current cardiac rhythm.   Patient Profile     51 y.o. female with past medical history of end-stage renal disease dialysis dependent, hypertension, diabetes mellitus, hyperlipidemia with non-ST elevation myocardial infarction.  Assessment & Plan    1 non-ST elevation myocardial infarction -She is having some chest discomfort this morning but it does not sound cardiac.  It is worse with deep breathing.  She thinks it was the way she slept last night -I will get a stat EKG and send off a set of at hs troponin -2D echo showed normal LV function with EF 55 to 60% no focal wall motion maladies.  There is moderate LVH and grade 2 diastolic dysfunction. -Continue aspirin 81 mg daily, carvedilol 25 mg twice daily and atorvastatin 10 mg daily  -Patient is not on heparin given recent rectal bleeding.   -Tentatively plan for cardiac catheterization on Monday  -She has had some hemorrhoidal bleeding and if she has any recurrent GI bleeding over the weekend will need colonoscopy prior to  2 rectal bleeding -GI feels this is hemorrhoidal bleeding but still having moderate blood in the toilet and on washcloths when wiping -Hemoglobin dropped from 8.2-7.9 this morning  -Await further recs from GI  3 end-stage renal disease -Nephrology following  4 anemia -likely secondary to renal insufficiency with possible contribution from rectal bleeding.   -Hemoglobin slightly decreased from yesterday but may be due to blood draws  5 hypertension -BP is well controlled on exam -Continue carvedilol 25 mg daily twice daily and Imdur 60 mg  daily  6 morbid obesity-needs weight loss.  7 aortic valve stenosis -Mild by echo this admission with mean aortic valve gradient 13 mmHg  I have spent a total of 30 minutes with patient reviewing 2D echo, telemetry, EKGs, labs and examining patient as well as establishing an assessment and plan that was discussed with the patient.  > 50% of time was spent in direct patient care.     For questions or updates, please contact [place] Please consult [email or web address] for contact info under        Signed, Fransico Him, MD  05/18/2021, 8:11 AM

## 2021-05-18 NOTE — Progress Notes (Addendum)
[facility] Progress Note  CC:  Hematochezia, abdominal pain.  Subjective: Last bowel movement was 2 days ago.  She went to the bathroom to urinate, passed some gas  and she saw a few drops of dark red blood in the toilet water.  Her RN noted bright red blood on the bed pad this morning.  She has minor anterolateral constant described as sharp cut like pain.  She has intermittent upper abdominal pain which is not severe.  No nausea or vomiting.  She had midsternal chest pain earlier this morning, twelve-lead EKG was done and she was seen by cardiologist Dr. Golden Hurter.  No chest pain, palpitations or shortness of breath at this time.   Objective:  Vital signs in last 24 hours: Temp:  [97.6 F (36.4 C)-98.7 F (37.1 C)] 98.7 F (37.1 C) (02/11 1124) Pulse Rate:  [58-77] 69 (02/11 1124) Resp:  [13-25] 16 (02/11 1124) BP: (83-177)/(26-147) 131/105 (02/11 1124) SpO2:  [98 %-100 %] 100 % (02/11 1124) Weight:  [167.9 kg-169.9 kg] 168.3 kg (02/10 2134) Last BM Date: 05/17/21 General: Morbidly obese 51 year old female sitting up in the chair in no acute distress Heart: Regular rate and rhythm, no murmurs. Pulm: Sounds clear, diminished in the bases. Abdomen: Soft, mild upper abdominal tenderness without rebound or guarding. Extremities:  Without edema. RUW AV fistula with + bruit and thrill.  Neurologic:  Alert and  oriented x 4. Grossly normal neurologically. Psych:  Alert and cooperative. Normal mood and affect.  Intake/Output from previous day: 02/10 0701 - 02/11 0700 In: 240 [P.O.:240] Out: 1895  Intake/Output this shift: No intake/output data recorded.  Lab Results: Recent Labs    05/17/21 0053 05/17/21 0628 05/18/21 0148  WBC 14.3* 11.5* 11.4*  HGB 8.4* 8.2* 7.9*  HCT 28.2* 28.2* 26.1*  PLT 220 205 187   BMET Recent Labs    05/16/21 1115 05/17/21 0628 05/18/21 0148  NA 130* 133* 134*  K 5.1 5.0 4.7  CL 93* 95* 97*  CO2 24 22 25   GLUCOSE 166*  139* 236*  BUN 71* 76* 47*  CREATININE 14.11* 15.64* 11.57*  CALCIUM 9.0 8.9 8.6*   LFT Recent Labs    05/18/21 0148  PROT 7.0  ALBUMIN 3.1*  AST 14*  ALT 14  ALKPHOS 59  BILITOT 0.3   PT/INR No results for input(s): LABPROT, INR in the last 72 hours. Hepatitis Panel No results for input(s): HEPBSAG, HCVAB, HEPAIGM, HEPBIGM in the last 72 hours.  DG Chest Portable 1 View  Result Date: 05/16/2021 CLINICAL DATA:  IV access EXAM: PORTABLE CHEST 1 VIEW COMPARISON:  05/16/2021 FINDINGS: Left dialysis catheter with tip over the cavoatrial junction region, unchanged. New right central venous catheter is present with tip over the low SVC region. No pneumothorax. Cardiac enlargement. No vascular congestion, edema, or consolidation. Mediastinal contours appear intact. IMPRESSION: Appliances appear in satisfactory position.  Cardiac enlargement. Electronically Signed   By: Lucienne Capers M.D.   On: 05/16/2021 19:30   DG Chest Port 1 View  Result Date: 05/16/2021 CLINICAL DATA:  Shortness of breath, chest pain EXAM: PORTABLE CHEST 1 VIEW COMPARISON:  Previous studies including the examination of 04/22/2021 FINDINGS: Transverse diameter of heart is increased. Central pulmonary vessels are prominent without signs of alveolar pulmonary edema. There is no focal pulmonary consolidation. Left lateral CP angle is indistinct. There is questionable minimal blunting of right lateral CP angle. There is no pneumothorax. IMPRESSION: Cardiomegaly. Central pulmonary vessels are prominent suggesting mild  CHF. There are no signs of alveolar pulmonary edema or focal pulmonary consolidation. Small bilateral pleural effusions, more so on the left side. Electronically Signed   By: Elmer Picker M.D.   On: 05/16/2021 12:11   DG Foot 2 Views Left  Result Date: 05/17/2021 CLINICAL DATA:  Pain and swelling EXAM: LEFT FOOT - 2 VIEW COMPARISON:  None. FINDINGS: No fracture or dislocation is seen. Extensive arterial  calcifications are seen. Plantar spur is seen in calcaneus. There is mild hallux valgus deformity. Cortical irregularity in the medial aspect of head of the first metatarsal may be residual from previous injury. IMPRESSION: No fracture or dislocation is seen. There are no focal lytic lesions. Plantar spur is seen in calcaneus. Electronically Signed   By: Elmer Picker M.D.   On: 05/17/2021 13:41   DG Foot 2 Views Right  Result Date: 05/17/2021 CLINICAL DATA:  Pain and swelling EXAM: RIGHT FOOT - 2 VIEW COMPARISON:  04/23/2021 FINDINGS: No fracture or dislocation is seen. Plantar spur is seen in calcaneus. Degenerative changes are noted in the intertarsal joints with bony spurs in the dorsal aspect. Extensive arterial calcifications are seen in the soft tissues. There is mild hallux valgus deformity. IMPRESSION: No fracture or dislocation is seen. There are no focal lytic lesions. Plantar spur is seen in calcaneus. Electronically Signed   By: Elmer Picker M.D.   On: 05/17/2021 13:43   ECHOCARDIOGRAM COMPLETE  Result Date: 05/17/2021    ECHOCARDIOGRAM REPORT   Patient Name:   Courtney Gray Date of Exam: 05/17/2021 Medical Rec #:  [id number]       Height:       65.0 in Accession #:    [id number]      Weight:       375.2 lb Date of Birth:  1970-06-09        BSA:          2.585 m Patient Age:    37 years        BP:           127/83 mmHg Patient Gender: F               HR:           70 bpm. Exam Location:  Inpatient Procedure: 2D Echo, Cardiac Doppler, Color Doppler and Intracardiac            Opacification Agent Indications:    NSTEMI  History:        Patient has prior history of Echocardiogram examinations, most                 recent 04/23/2021. Risk Factors:Hypertension, Diabetes and                 Dyslipidemia. ESRD.  Sonographer:    Clayton Lefort RDCS (AE) Referring Phys: [id number] Leanne Chang [facility]  Sonographer Comments: Patient is morbidly obese. Image acquisition challenging due to patient body  habitus. IMPRESSIONS  1. Left ventricular ejection fraction, by estimation, is 55 to 60%. The left ventricle has normal function. The left ventricle has no regional wall motion abnormalities. There is moderate concentric left ventricular hypertrophy. Left ventricular diastolic parameters are consistent with Grade II diastolic dysfunction (pseudonormalization). Elevated left atrial pressure.  2. Right ventricular systolic function is normal. The right ventricular size is normal. Tricuspid regurgitation signal is inadequate for assessing PA pressure.  3. Left atrial size was mildly dilated.  4. The mitral valve is degenerative. Mild mitral valve regurgitation. No  evidence of mitral stenosis. The mean mitral valve gradient is 3.0 mmHg with average heart rate of 68 bpm. Moderate to severe mitral annular calcification.  5. The aortic valve is tricuspid. There is moderate calcification of the aortic valve. There is moderate thickening of the aortic valve. Aortic valve regurgitation is not visualized. Mild aortic valve stenosis. Aortic valve mean gradient measures 13.0 mmHg. Aortic valve Vmax measures 2.45 m/s. Comparison(s): Prior images reviewed side by side. There is now mild aortic stenosis. FINDINGS  Left Ventricle: Left ventricular ejection fraction, by estimation, is 55 to 60%. The left ventricle has normal function. The left ventricle has no regional wall motion abnormalities. Definity contrast agent was given IV to delineate the left ventricular  endocardial borders. The left ventricular internal cavity size was normal in size. There is moderate concentric left ventricular hypertrophy. Left ventricular diastolic parameters are consistent with Grade II diastolic dysfunction (pseudonormalization).  Elevated left atrial pressure. Right Ventricle: The right ventricular size is normal. No increase in right ventricular wall thickness. Right ventricular systolic function is normal. Tricuspid regurgitation signal is  inadequate for assessing PA pressure. Left Atrium: Left atrial size was mildly dilated. Right Atrium: Right atrial size was normal in size. Pericardium: There is no evidence of pericardial effusion. Mitral Valve: The mitral valve is degenerative in appearance. Moderate to severe mitral annular calcification. Mild mitral valve regurgitation. No evidence of mitral valve stenosis. MV peak gradient, 6.7 mmHg. The mean mitral valve gradient is 3.0 mmHg with average heart rate of 68 bpm. Tricuspid Valve: The tricuspid valve is normal in structure. Tricuspid valve regurgitation is trivial. Aortic Valve: The aortic valve is tricuspid. There is moderate calcification of the aortic valve. There is moderate thickening of the aortic valve. Aortic valve regurgitation is not visualized. Mild aortic stenosis is present. Aortic valve mean gradient measures 13.0 mmHg. Aortic valve peak gradient measures 24.0 mmHg. Aortic valve area, by VTI measures 1.58 cm. Pulmonic Valve: The pulmonic valve was grossly normal. Pulmonic valve regurgitation is not visualized. Aorta: The aortic root is normal in size and structure. IAS/Shunts: No atrial level shunt detected by color flow Doppler.  LEFT VENTRICLE PLAX 2D LVIDd:         5.00 cm   Diastology LVIDs:         3.40 cm   LV e' medial:    5.70 cm/s LV PW:         1.60 cm   LV E/e' medial:  20.7 LV IVS:        1.80 cm   LV e' lateral:   7.58 cm/s LVOT diam:     2.16 cm   LV E/e' lateral: 15.6 LV SV:         81 LV SV Index:   31 LVOT Area:     3.66 cm  RIGHT VENTRICLE RV Basal diam:  3.30 cm RV S prime:     17.70 cm/s TAPSE (M-mode): 3.3 cm LEFT ATRIUM              Index        RIGHT ATRIUM           Index LA diam:        3.00 cm  1.16 cm/m   RA Area:     14.60 cm LA Vol (A2C):   104.0 ml 40.24 ml/m  RA Volume:   37.00 ml  14.32 ml/m LA Vol (A4C):   67.4 ml  26.08 ml/m LA Biplane Vol: 83.6 ml  32.35 ml/m  AORTIC VALVE AV Area (Vmax):    1.47 cm AV Area (Vmean):   1.41 cm AV Area (VTI):      1.58 cm AV Vmax:           245.00 cm/s AV Vmean:          168.000 cm/s AV VTI:            0.516 m AV Peak Grad:      24.0 mmHg AV Mean Grad:      13.0 mmHg LVOT Vmax:         98.50 cm/s LVOT Vmean:        64.600 cm/s LVOT VTI:          0.222 m LVOT/AV VTI ratio: 0.43  AORTA Ao Root diam: 3.30 cm Ao Asc diam:  3.40 cm MITRAL VALVE MV Area (PHT): 2.41 cm     SHUNTS MV Peak grad:  6.7 mmHg     Systemic VTI:  0.22 m MV Mean grad:  3.0 mmHg     Systemic Diam: 2.16 cm MV Vmax:       1.29 m/s MV Vmean:      83.9 cm/s MV Decel Time: 315 msec MV E velocity: 118.00 cm/s MV A velocity: 108.00 cm/s MV E/A ratio:  1.09 Mihai Croitoru MD Electronically signed by Sanda Klein MD Signature Date/Time: 05/17/2021/1:29:46 PM    Final    [place] EKG SITE RITE  Result Date: 05/16/2021 If Site Rite image not attached, placement could not be confirmed due to current cardiac rhythm.   Assessment / Plan:  1) Rectal bleeding, suspect hemorrhoidal and possible anal fissure. She passes a small amount of red blood per the rectum this morning, patient reports blood was darker red, RN reported blood is bright red. Hg 8.2 -> today 7.9. Per rectal exam by Dr. Candis Schatz 2/10 patient had external hemorrhoids and anal fissure was suspected. -Plan was to consider a colonoscopy 05/19/2021 if she continued to have rectal bleeding and if her Hg level dropped. I discussed scheduling a colonoscopy tomorrow. Patient wishes to wait a few days, needs time to rest before pursuing a colonoscopy. -Changed her diet to clear liquids in case she is appropriate and agreeable to pursuing a colonoscopy tomorrow -Defer further recommendations to Dr. Candis Schatz  2) Chronic anemia. Hg 8.2 -> today 7.9.  On IV Venofer weekly with dialysis.   3) Admitted to the hospital 05/16/2021 with chest pain. + NSTEMI.   Cardiac cath on hold due to hematochezia.  4) ESRD on HD every T/TH/Sat  5) DM II   6) Chronic respiratory failure on oxygen 2L [place]  7) Mild upper  abdominal pain -Continue Pantoprazole 40 mg p.o. daily -CTAP if abdominal pain worsens   Principal Problem:   NSTEMI (non-ST elevated myocardial infarction) ([facility]) Active Problems:   Type 2 diabetes mellitus with other specified complication (HCC)   Morbid obesity (HCC)   HTN (hypertension)   ESRD (end stage renal disease) (HCC)   Chronic respiratory failure with hypoxia (HCC)   Rectal bleeding   Bilateral foot pain   Anemia of chronic renal failure   Morbid obesity with BMI of 60.0-69.9, adult ([place])     LOS: 2 days   Noralyn Pick  05/18/2021, 11:34 AM  I have taken a history, reviewed the chart and examined the patient. I performed a substantive portion of this encounter, including complete performance of at least one of the key components, in conjunction with the  APP. I agree with the APP's note, impression and recommendations  Please see note yesterday for in-depth discussion on my thoughts on the etiology of her hematochezia.  Overnight, patient had some more bright red blood per rectum in the absence of passage of stool.  The patient describes this bleeding is similar to the bright red blood she has had intermittently in the past.  She has not had any of the larger volume bloody bowel movements since Thursday morning (which was then followed by a normal bowel movement later that day).  Her hemoglobin has drifted by few tenths of a point each day without any significant abrupt changes.  The likelihood of her having high risk bleeding lesion in her colon is very low.  Diverticular bleeding can present with profuse hematochezia, but she has not had any such bowel movements in 48 hours.  In addition, it would be impossible to identify a suspect diverticulum to treat in the absence of active bleeding (which she does not currently have). Hemorrhoid banding could be considered to reduce her risk of worsening bleeding after she is placed on dual antiplatelet therapy.  However, the  bleeding risk following hemorrhoid banding is highest in the first 2 weeks after the bands were placed.  I recommended against proceeding with a bowel prep today and colonoscopy tomorrow given the absence of any clinically significant bleeding and the very high risk of perioperative cardiovascular complications given NSTEMI.   I think we can get the CT scan now the patient has been dialyzed, and this will further exclude etiologies such as ischemic colitis or obvious colonic mass.  Continue treating constipation with senna twice daily, add MiraLAX daily if no bowel movement by tomorrow  We will reassess patient tomorrow.  Tawnie Ehresman E. Candis Schatz, MD [facility]
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
## 2021-05-18 NOTE — Assessment & Plan Note (Addendum)
Lipid panel with total cholesterol 107, HDL 23, LDL 40, triglycerides of 222, VLDL 44.  Continue atorvastatin 10 mg PO nightly

## 2021-05-18 NOTE — Progress Notes (Addendum)
Pt having 6/10 CP mid-sternum. At 1324, BP 140/94, 1 nitro given. MD Turner notified. At 1329, CP 0/10, BP 157/73. EKG obtained. PA Steinauer notified. MD Johney Frame on the floor, notified.

## 2021-05-18 NOTE — Progress Notes (Signed)
Patient having lots of physical requests/needs and some emotional. Unsure if possibly related to anxiety or just a desire to have someone in the room. Foot pain remained elevated most of shift as patient feels PRN meds are starting to be less effective. Some of her home meds are ordered differently here, if at all, and patient expressing frustration around that. Encouraging to discuss during rounds. Wrote down several of patient's concerns to help her remember for discussion. Also has concerns around her blood pressure meds given before/around dialysis, as her evening session on 2/10 caused her pressure to drop to 90s/40s (per report) and she states she became symptomatic - lightheaded, weak, etc. She asked to skip her night time carvedilol for fear of another incident. But still had an episode this shift mid-night getting from the bathroom back to her bed where she felt weak and "funny", not completely able to articulate how feeling, maybe some drowsiness, and felt like even her voice briefly sounded different to her. Resolved on own. PRN Tucks pads utilized for hemorrhoid bleeding (moderate blood in toilet and some on washcloths when wiping).

## 2021-05-19 ENCOUNTER — Inpatient Hospital Stay (HOSPITAL_COMMUNITY): Payer: Medicare Other

## 2021-05-19 DIAGNOSIS — I214 Non-ST elevation (NSTEMI) myocardial infarction: Secondary | ICD-10-CM | POA: Diagnosis not present

## 2021-05-19 DIAGNOSIS — K625 Hemorrhage of anus and rectum: Secondary | ICD-10-CM | POA: Diagnosis not present

## 2021-05-19 DIAGNOSIS — N186 End stage renal disease: Secondary | ICD-10-CM | POA: Diagnosis not present

## 2021-05-19 DIAGNOSIS — N185 Chronic kidney disease, stage 5: Secondary | ICD-10-CM | POA: Diagnosis not present

## 2021-05-19 DIAGNOSIS — N189 Chronic kidney disease, unspecified: Secondary | ICD-10-CM | POA: Diagnosis not present

## 2021-05-19 DIAGNOSIS — I10 Essential (primary) hypertension: Secondary | ICD-10-CM | POA: Diagnosis not present

## 2021-05-19 DIAGNOSIS — K649 Unspecified hemorrhoids: Secondary | ICD-10-CM | POA: Diagnosis not present

## 2021-05-19 LAB — CBC WITH DIFFERENTIAL/PLATELET
Abs Immature Granulocytes: 0.08 10*3/uL — ABNORMAL HIGH (ref 0.00–0.07)
Basophils Absolute: 0 10*3/uL (ref 0.0–0.1)
Basophils Relative: 0 %
Eosinophils Absolute: 0.4 10*3/uL (ref 0.0–0.5)
Eosinophils Relative: 3 %
HCT: 28.1 % — ABNORMAL LOW (ref 36.0–46.0)
Hemoglobin: 8.3 g/dL — ABNORMAL LOW (ref 12.0–15.0)
Immature Granulocytes: 1 %
Lymphocytes Relative: 10 %
Lymphs Abs: 1.1 10*3/uL (ref 0.7–4.0)
MCH: 29.2 pg (ref 26.0–34.0)
MCHC: 29.5 g/dL — ABNORMAL LOW (ref 30.0–36.0)
MCV: 98.9 fL (ref 80.0–100.0)
Monocytes Absolute: 1.1 10*3/uL — ABNORMAL HIGH (ref 0.1–1.0)
Monocytes Relative: 10 %
Neutro Abs: 8.5 10*3/uL — ABNORMAL HIGH (ref 1.7–7.7)
Neutrophils Relative %: 76 %
Platelets: 176 10*3/uL (ref 150–400)
RBC: 2.84 MIL/uL — ABNORMAL LOW (ref 3.87–5.11)
RDW: 15.9 % — ABNORMAL HIGH (ref 11.5–15.5)
WBC: 11.3 10*3/uL — ABNORMAL HIGH (ref 4.0–10.5)
nRBC: 0 % (ref 0.0–0.2)

## 2021-05-19 LAB — URIC ACID: Uric Acid, Serum: 4.6 mg/dL (ref 2.5–7.1)

## 2021-05-19 LAB — COMPREHENSIVE METABOLIC PANEL
ALT: 14 U/L (ref 0–44)
AST: 14 U/L — ABNORMAL LOW (ref 15–41)
Albumin: 3.3 g/dL — ABNORMAL LOW (ref 3.5–5.0)
Alkaline Phosphatase: 64 U/L (ref 38–126)
Anion gap: 13 (ref 5–15)
BUN: 39 mg/dL — ABNORMAL HIGH (ref 6–20)
CO2: 26 mmol/L (ref 22–32)
Calcium: 9.6 mg/dL (ref 8.9–10.3)
Chloride: 95 mmol/L — ABNORMAL LOW (ref 98–111)
Creatinine, Ser: 10.92 mg/dL — ABNORMAL HIGH (ref 0.44–1.00)
GFR, Estimated: 4 mL/min — ABNORMAL LOW (ref >60)
Glucose, Bld: 187 mg/dL — ABNORMAL HIGH (ref 70–99)
Potassium: 4.7 mmol/L (ref 3.5–5.1)
Sodium: 134 mmol/L — ABNORMAL LOW (ref 135–145)
Total Bilirubin: 0.4 mg/dL (ref 0.3–1.2)
Total Protein: 7.9 g/dL (ref 6.5–8.1)

## 2021-05-19 LAB — GLUCOSE, CAPILLARY
Glucose-Capillary: 164 mg/dL — ABNORMAL HIGH (ref 70–99)
Glucose-Capillary: 170 mg/dL — ABNORMAL HIGH (ref 70–99)
Glucose-Capillary: 164 mg/dL — ABNORMAL HIGH (ref 70–99)
Glucose-Capillary: 171 mg/dL — ABNORMAL HIGH (ref 70–99)
Glucose-Capillary: 197 mg/dL — ABNORMAL HIGH (ref 70–99)
Glucose-Capillary: 201 mg/dL — ABNORMAL HIGH (ref 70–99)

## 2021-05-19 LAB — MAGNESIUM: Magnesium: 2.2 mg/dL (ref 1.7–2.4)

## 2021-05-19 LAB — PHOSPHORUS: Phosphorus: 6.8 mg/dL — ABNORMAL HIGH (ref 2.5–4.6)

## 2021-05-19 MED ORDER — MIDODRINE HCL 5 MG PO TABS: 5.0000 mg | ORAL_TABLET | Freq: Three times a day (TID) | ORAL | Status: DC

## 2021-05-19 MED ORDER — SODIUM CHLORIDE 0.9 % IV BOLUS
250.0000 mL | Freq: Once | INTRAVENOUS | Status: AC
  Administered 2021-05-19: 250 mL via INTRAVENOUS

## 2021-05-19 MED ORDER — SODIUM CHLORIDE 0.9% FLUSH
3.0000 mL | Freq: Two times a day (BID) | INTRAVENOUS | Status: DC
  Administered 2021-05-19 – 2021-05-23 (×8): 3 mL via INTRAVENOUS

## 2021-05-19 MED ORDER — MIDODRINE HCL 5 MG PO TABS
5.0000 mg | ORAL_TABLET | Freq: Two times a day (BID) | ORAL | Status: DC
  Administered 2021-05-21 – 2021-05-22 (×2): 5 mg via ORAL
  Filled 2021-05-19 (×4): qty 1

## 2021-05-19 MED ORDER — TIZANIDINE HCL 4 MG PO TABS
4.0000 mg | ORAL_TABLET | Freq: Every day | ORAL | Status: DC
  Administered 2021-05-19 – 2021-05-21 (×3): 4 mg via ORAL
  Filled 2021-05-19 (×4): qty 1

## 2021-05-19 MED ORDER — POLYETHYLENE GLYCOL 3350 17 G PO PACK
17.0000 g | PACK | Freq: Two times a day (BID) | ORAL | Status: DC
  Filled 2021-05-19: qty 1

## 2021-05-19 MED ORDER — SODIUM CHLORIDE 0.9 % IV BOLUS: 250.0000 mL | Freq: Once | INTRAVENOUS | Status: DC

## 2021-05-19 NOTE — Progress Notes (Signed)
Only scant/small bleed noticed when patient wiped with a papertowel. Made patient aware of plan for CT scan with oral contrast to possibly replace doing colonoscopy. Patient had many questions and concerns, mostly around the idea of drinking a liter of fluid after just having had dialysis and with inability to produce urine. Contacted physician who stated it was not an emergent scan and if patient did not feel comfortable, she could wait to discuss further with day team. She decided to hold off and wants to have her daughter involved for rounds. Patient expressing general anxiety and concern with her care. She states that it takes time for her to mentally and emotionally process new information, and that when new procedures and things are ordered for her it often overwhelms her. She would like information in more tangible mediums, like paper information and videos. She is beginning to anticipate heart cath planned for Monday but still lacking knowledge around what is involved. Began explaining procedure and what to expect with help from charge nurse, but still need physician explanation and to sign consent form.

## 2021-05-19 NOTE — Progress Notes (Signed)
Pt reports feeling sleepy, SBP in the 80's. Paged cardiology advised to contact attending. Paged attending and advised Dr. Alfredia Ferguson. 20 bolus of normal saline order.

## 2021-05-19 NOTE — Progress Notes (Addendum)
[facility] Progress Note  Subjective: rapid response called this am for low BP w/ symptoms, they dc'd coreg and imdur and gave NS bolus 250 cc I believe. SOB better since HD x 2 otherwise.   Vitals:   05/19/21 0853 05/19/21 1000 05/19/21 1005 05/19/21 1255  BP: (!) 149/72 (!) 82/30 (!) 95/47 (!) 99/57  Pulse: 71     Resp:      Temp:      TempSrc:      SpO2:      Weight:      Height:        Exam: Gen alert, no distress No jvd or bruits Chest mostly clear, faint basilar rales , better RRR no MRG Abd soft ntnd no mass or ascites +bs obese Ext no LE or UE edema Neuro is alert, Ox 3 , nf  L IJ TDC, old nonfunctioning RUE AVF     Home meds include - norvasc 10, coreg, lipitor, neurontin, glipizide , imdur, lantus insulin, lisinopril 20mg  prn, sl ntg prn, protonix, prns/ vits/ supp      OP HD: TTS [name]  4h 39min   400/500  165kg  1K/2.5Ca bath  TDC /(not using AVF) Hep 2600 + 1500 unit /hr  - rocaltrol 1.0 ug tiw  - mircera 200 ug q 2 wks, last 2/03  - venofer 50 mg weekly       CXR 2/09 - borderline edema      Assessment/ Plan: NSTEMI - per cards. Plan is for [facility] on Monday.  DOE - due to vol overload, had early edema by CXR and was up 5kg on admission. Has had HD x 2 here w/ 5 L total net UF, wt's are down close to dry wt, 1kg up. Next HD Tuesday. Lower vol if BP's tolerate HypOtension - imdur dc'd today and got NS bolus due to low BP in the 80's. BP's have been high last 2 days but are now low, possibly due to HD/ vol lowering vs ischemia vs other. Will dc coreg also now and start midodrine. Going for L heart cath tomorrow.  ESRD - on HD TTS.  Had HD here Fri and Sat. Next HD Tuesday 2/14 to get back on schedule.  DM2 - per pmd Anemia ckd - Hb 8's, next esa due on 2/17. Cont weekly IV fe.  MBD ckd - Ca in range, add on phos. Cont vdra, ?binder.           Rob Doctor, hospital 05/18/2021, 2:15 PM   Recent Labs  Lab 05/18/21 0148 05/19/21 0404  K 4.7  4.7  BUN 47* 39*  CREATININE 11.57* 10.92*  ALBUMIN 3.1* 3.3*  CALCIUM 8.6* 9.6  PHOS 6.2* 6.8*  HGB 7.9* 8.3*    Inpatient medications:  aspirin  81 mg Oral Daily   atorvastatin  10 mg Oral QHS   calcitRIOL  1 mcg Oral Q T,Th,Sa-HD   calcium carbonate  1,500 mg Oral TID WC   carvedilol  25 mg Oral 2 times per day on Sun Mon Wed Fri   carvedilol  25 mg Oral Q T,Th,Sa-HD   Chlorhexidine Gluconate Cloth  6 each Topical Daily   Chlorhexidine Gluconate Cloth  6 each Topical Q0600   gabapentin  100 mg Oral Once per day on Tue Thu Sat   gabapentin  100 mg Oral 2 times per day on Sun Mon Wed Fri   insulin aspart  0-5 Units Subcutaneous QHS   insulin aspart  0-6  Units Subcutaneous TID WC   pantoprazole  40 mg Oral Daily   senna-docusate  1 tablet Oral BID   sodium chloride flush  3 mL Intravenous Q12H   sodium chloride flush  3 mL Intravenous Q12H   tiZANidine  4 mg Oral QHS    sodium chloride     sodium chloride     [START ON 05/23/2021] ferric gluconate (FERRLECIT) IVPB     sodium chloride, acetaminophen, calcium carbonate, HYDROcodone-acetaminophen, nitroGLYCERIN, ondansetron **OR** ondansetron (ZOFRAN) IV, sodium chloride flush, witch hazel-glycerin

## 2021-05-19 NOTE — Progress Notes (Signed)
[facility] Progress Note  Subjective: no new c/o  Vitals:   05/18/21 1949 05/18/21 2100 05/18/21 2210 05/19/21 0447  BP: 114/70 (!) 147/62 130/74 (!) 139/52  Pulse: 78     Resp: 20   20  Temp: 98.9 F (37.2 C)   98.1 F (36.7 C)  TempSrc: Oral   Oral  SpO2:    99%  Weight:  (!) 166.4 kg    Height:        Exam: Gen alert, no distress No jvd or bruits Chest CTA bilat RRR no MRG Abd soft ntnd no mass or ascites +bs obese Ext no LE or UE edema Neuro is alert, Ox 3 , nf  L IJ TDC, old nonfunctioning RUE AVF     Home meds include - norvasc 10, coreg, lipitor, neurontin, glipizide , imdur, lantus insulin, lisinopril 20mg  prn, sl ntg prn, protonix, prns/ vits/ supp      OP HD: TTS [name]  4h 41min   400/500  165kg  1K/2.5Ca bath  TDC /(not using AVF) Hep 2600 + 1500 unit /hr  - rocaltrol 1.0 ug tiw  - mircera 200 ug q 2 wks, last 2/03  - venofer 50 mg weekly       CXR 2/09 - borderline edema      Assessment/ Plan: NSTEMI - per cards/ pmd DOE - due to vol overload. Early edema by CXR. Up 5kg by wts. Had HD yesterday w/ 2 L off. Planning HD again today, max UF.  ESRD - on HD TTS.  Last HD was 2/07. HD today to get back on schedule, lower vol further as above.  DM2 - per pmd Anemia ckd - Hb 8's, next esa due on 2/17. Cont weekly IV fe.  MBD ckd - Ca in range, add on phos. Cont vdra, ?binder.           Rob Doctor, hospital 05/18/2021, 2:15 PM   Recent Labs  Lab 05/18/21 0148 05/19/21 0404  K 4.7 4.7  BUN 47* 39*  CREATININE 11.57* 10.92*  ALBUMIN 3.1* 3.3*  CALCIUM 8.6* 9.6  PHOS 6.2* 6.8*  HGB 7.9* 8.3*   Inpatient medications:  aspirin  81 mg Oral Daily   atorvastatin  10 mg Oral QHS   calcitRIOL  1 mcg Oral Q T,Th,Sa-HD   calcium carbonate  1,500 mg Oral TID WC   carvedilol  25 mg Oral 2 times per day on Sun Mon Wed Fri   carvedilol  25 mg Oral Q T,Th,Sa-HD   Chlorhexidine Gluconate Cloth  6 each Topical Daily   Chlorhexidine Gluconate  Cloth  6 each Topical Q0600   gabapentin  100 mg Oral Once per day on Tue Thu Sat   gabapentin  100 mg Oral 2 times per day on Sun Mon Wed Fri   insulin aspart  0-5 Units Subcutaneous QHS   insulin aspart  0-6 Units Subcutaneous TID WC   isosorbide mononitrate  60 mg Oral Daily   pantoprazole  40 mg Oral Daily   senna-docusate  1 tablet Oral BID   sodium chloride flush  3 mL Intravenous Q12H   tiZANidine  4 mg Oral Daily    sodium chloride     sodium chloride     [START ON 05/23/2021] ferric gluconate (FERRLECIT) IVPB     sodium chloride, acetaminophen, calcium carbonate, HYDROcodone-acetaminophen, nitroGLYCERIN, ondansetron **OR** ondansetron (ZOFRAN) IV, sodium chloride flush, witch hazel-glycerin

## 2021-05-19 NOTE — Progress Notes (Signed)
[facility] ROUNDING NOTE   Subjective: No acute events overnight.  Patient still is yet to have bowel movement.  She tried again to have a bowel movement, but passed only small amounts of bright red blood, consistent with hemorrhoidal bleeding.  We again discussed doing the CT scan to further exclude etiologies such as colon mass or ischemic colitis.  She had significant concerns about drinking the contrast and the effect it would have on her volume status.  I tried to allay her concerns that the contrast does not door by the kidneys and it should have no effect on her intravascular volume.  Objective: Vital signs in last 24 hours: Temp:  [98.1 F (36.7 C)-98.9 F (37.2 C)] 98.1 F (36.7 C) (02/12 0447) Pulse Rate:  [71-78] 71 (02/12 0853) Resp:  [20] 20 (02/12 0447) BP: (82-149)/(30-74) 135/60 (02/12 1635) SpO2:  [99 %] 99 % (02/12 0447) Weight:  [166.4 kg] 166.4 kg (02/11 2100) Last BM Date: 05/17/21 General: NAD, morbidly obese, sitting in chair     Intake/Output from previous day: 02/11 0701 - 02/12 0700 In: -  Out: 3000  Intake/Output this shift: Total I/O In: 480 [P.O.:480] Out: -    Lab Results: Recent Labs    05/17/21 0628 05/18/21 0148 05/19/21 0404  WBC 11.5* 11.4* 11.3*  HGB 8.2* 7.9* 8.3*  PLT 205 187 176  MCV 100.7* 99.6 98.9   BMET Recent Labs    05/17/21 0628 05/18/21 0148 05/19/21 0404  NA 133* 134* 134*  K 5.0 4.7 4.7  CL 95* 97* 95*  CO2 22 25 26   GLUCOSE 139* 236* 187*  BUN 76* 47* 39*  CREATININE 15.64* 11.57* 10.92*  CALCIUM 8.9 8.6* 9.6   LFT Recent Labs    05/18/21 0148 05/19/21 0404  PROT 7.0 7.9  ALBUMIN 3.1* 3.3*  AST 14* 14*  ALT 14 14  ALKPHOS 59 64  BILITOT 0.3 0.4   PT/INR No results for input(s): INR in the last 72 hours.    Imaging/Other results: CT ABDOMEN PELVIS WO CONTRAST  Result Date: 05/19/2021 CLINICAL DATA:  Rectal bleeding, rule out GMS EXAM: CT ABDOMEN AND PELVIS WITHOUT CONTRAST  TECHNIQUE: Multidetector CT imaging of the abdomen and pelvis was performed following the standard protocol without IV contrast. RADIATION DOSE REDUCTION: This exam was performed according to the departmental dose-optimization program which includes automated exposure control, adjustment of the mA and/or kV according to patient size and/or use of iterative reconstruction technique. COMPARISON:  09/02/2020 FINDINGS: Lower chest: No acute abnormality. Extensive three-vessel artery calcifications. Hepatobiliary: No solid liver abnormality is seen. No gallstones, gallbladder wall thickening, or biliary dilatation. Pancreas: Unremarkable. No pancreatic ductal dilatation or surrounding inflammatory changes. Spleen: Normal in size without significant abnormality. Adrenals/Urinary Tract: Adrenal glands are unremarkable. Very atrophic bilateral kidneys. No calculi or hydronephrosis. Bladder is unremarkable. Stomach/Bowel: Stomach is within normal limits. Appendix appears normal. No evidence of bowel wall thickening, distention, or inflammatory changes. Previously ingested barium contrast admixed with stool in the distal colon and rectum. Vascular/Lymphatic: Aortic atherosclerosis and vascular calcinosis. No enlarged abdominal or pelvic lymph nodes. Reproductive: No mass or other significant abnormality. Other: No abdominal wall hernia or abnormality. No ascites. Musculoskeletal: No acute or significant osseous findings. IMPRESSION: 1. Previously ingested barium contrast admixed with stool in the distal colon and rectum. Within this limitation, no acute noncontrast CT findings of the abdomen or pelvis to explain or localize rectal bleeding. 2. Very atrophic bilateral kidneys. 3. Coronary artery disease. Aortic Atherosclerosis (ICD10-I70.0).  Electronically Signed   By: Delanna Ahmadi M.D.   On: 05/19/2021 18:02      Assessment and Plan:  51 year old female with morbid obesity, end-stage renal disease presented with bright  red blood per rectum, found to have NSTEMI with troponins peaking at 1800.  Her hemoglobin has been stable since January.  Her description of her bleeding is consistent with hemorrhoidal plus minus anal fissures.  As previously noted, a colonoscopy is warranted, but an urgent colonoscopy in the midst of an NSTEMI was felt to be high risk in the absence of active bleeding.  Given her stable hemoglobin since admission, in the absence of any GI bleeding, I recommended against proceeding with a colonoscopy prior to addressing her coronary artery disease.  We will increase her bowel regimen today by adding MiraLAX twice daily in addition to her senna twice daily to improve her constipation and reduce hemorrhoidal bleeding.  The patient was apparently not willing to drink contrast for her CT scan which was intended to exclude etiologies such as ischemic colitis or an obvious colonic mass.  I do not think her cardiac catheterization needs to be delayed because of this.  Hematochezia, secondary to hemorrhoids vs anal fissure - Add MiraLAX twice daily to senna twice daily - Would recommend adding fiber supplementation on a daily basis as well - Patient will need a colonoscopy in the near future, pending results of her cardiac catheterization - We will arrange for outpatient follow-up with myself. -GI will sign off for now.  Please reconsult if patient has significant overt bleeding or acute drop in hemoglobin   [name], MD  05/19/2021, 6:25 PM [facility]

## 2021-05-19 NOTE — Progress Notes (Addendum)
Progress Note  Patient Name: Courtney Gray Date of Encounter: 05/19/2021  [facility] Cardiologist: Carlyle Dolly  Subjective  Patient had chest pain yesterday but was atypical and improved with belching. HS troponin continues to trend downward.  EKG was nonischemic.  She has not had any more chest pain since then.  Has a lot of questions about the heart cath.  Still only having scant bleeding when wiping on a paper towel.  From presumed hemorrhoids.  Plan is for CT scan with oral contrast to replace colonoscopy today.  Normal sinus rhythm  Inpatient Medications    Scheduled Meds:  aspirin  81 mg Oral Daily   atorvastatin  10 mg Oral QHS   calcitRIOL  1 mcg Oral Q T,Th,Sa-HD   calcium carbonate  1,500 mg Oral TID WC   carvedilol  25 mg Oral 2 times per day on Sun Mon Wed Fri   carvedilol  25 mg Oral Q T,Th,Sa-HD   Chlorhexidine Gluconate Cloth  6 each Topical Daily   Chlorhexidine Gluconate Cloth  6 each Topical Q0600   gabapentin  100 mg Oral Once per day on Tue Thu Sat   gabapentin  100 mg Oral 2 times per day on Sun Mon Wed Fri   insulin aspart  0-5 Units Subcutaneous QHS   insulin aspart  0-6 Units Subcutaneous TID WC   isosorbide mononitrate  60 mg Oral Daily   pantoprazole  40 mg Oral Daily   senna-docusate  1 tablet Oral BID   sodium chloride flush  3 mL Intravenous Q12H   tiZANidine  4 mg Oral Daily   Continuous Infusions:  sodium chloride     sodium chloride     [START ON 05/23/2021] ferric gluconate (FERRLECIT) IVPB     PRN Meds: sodium chloride, acetaminophen, calcium carbonate, HYDROcodone-acetaminophen, nitroGLYCERIN, ondansetron **OR** ondansetron (ZOFRAN) IV, sodium chloride flush, witch hazel-glycerin   Vital Signs    Vitals:   05/18/21 1949 05/18/21 2100 05/18/21 2210 05/19/21 0447  BP: 114/70 (!) 147/62 130/74 (!) 139/52  Pulse: 78     Resp: 20   20  Temp: 98.9 F (37.2 C)   98.1 F (36.7 C)  TempSrc: Oral   Oral  SpO2:    99%  Weight:   (!) 166.4 kg    Height:        Intake/Output Summary (Last 24 hours) at 05/19/2021 0801 Last data filed at 05/18/2021 1810 Gross per 24 hour  Intake --  Output 3000 ml  Net -3000 ml    Last 3 Weights 05/18/2021 05/18/2021 05/17/2021  Weight (lbs) 366 lb 13.5 oz 375 lb 14.2 oz 371 lb 0.6 oz  Weight (kg) 166.4 kg 170.5 kg 168.3 kg      Telemetry    NSR - Personally Reviewed  Physical Exam   GEN: Well nourished, well developed in no acute distress, morbidly obese HEENT: Normal NECK: No JVD; No carotid bruits LYMPHATICS: No lymphadenopathy CARDIAC:RRR, no murmurs, rubs, gallops RESPIRATORY:  Clear to auscultation without rales, wheezing or rhonchi  ABDOMEN: Soft, non-tender, non-distended MUSCULOSKELETAL:  No edema; No deformity  SKIN: Warm and dry NEUROLOGIC:  Alert and oriented x 3 PSYCHIATRIC:  Normal affect   Labs    High Sensitivity Troponin:   Recent Labs  Lab 05/16/21 1345 05/16/21 1615 05/17/21 0053 05/18/21 0837 05/18/21 1038  TROPONINIHS 837* 1,355* 1,843* 652* 625*      Chemistry Recent Labs  Lab 05/16/21 1115 05/17/21 0628 05/18/21 0148 05/19/21 0404  NA 130*  133* 134* 134*  K 5.1 5.0 4.7 4.7  CL 93* 95* 97* 95*  CO2 24 22 25 26   GLUCOSE 166* 139* 236* 187*  BUN 71* 76* 47* 39*  CREATININE 14.11* 15.64* 11.57* 10.92*  CALCIUM 9.0 8.9 8.6* 9.6  MG  --   --  2.3 2.2  PROT 7.9  --  7.0 7.9  ALBUMIN 3.5  --  3.1* 3.3*  AST 14*  --  14* 14*  ALT 17  --  14 14  ALKPHOS 69  --  59 64  BILITOT 0.7  --  0.3 0.4  GFRNONAA 3* 3* 4* 4*  ANIONGAP 13 16* 12 13      Hematology Recent Labs  Lab 05/17/21 0628 05/18/21 0148 05/18/21 0149 05/19/21 0404  WBC 11.5* 11.4*  --  11.3*  RBC 2.80* 2.62* 2.61* 2.84*  HGB 8.2* 7.9*  --  8.3*  HCT 28.2* 26.1*  --  28.1*  MCV 100.7* 99.6  --  98.9  MCH 29.3 30.2  --  29.2  MCHC 29.1* 30.3  --  29.5*  RDW 16.0* 16.1*  --  15.9*  PLT 205 187  --  176     Radiology    DG Foot 2 Views Left  Result  Date: 05/17/2021 CLINICAL DATA:  Pain and swelling EXAM: LEFT FOOT - 2 VIEW COMPARISON:  None. FINDINGS: No fracture or dislocation is seen. Extensive arterial calcifications are seen. Plantar spur is seen in calcaneus. There is mild hallux valgus deformity. Cortical irregularity in the medial aspect of head of the first metatarsal may be residual from previous injury. IMPRESSION: No fracture or dislocation is seen. There are no focal lytic lesions. Plantar spur is seen in calcaneus. Electronically Signed   By: Elmer Picker M.D.   On: 05/17/2021 13:41   DG Foot 2 Views Right  Result Date: 05/17/2021 CLINICAL DATA:  Pain and swelling EXAM: RIGHT FOOT - 2 VIEW COMPARISON:  04/23/2021 FINDINGS: No fracture or dislocation is seen. Plantar spur is seen in calcaneus. Degenerative changes are noted in the intertarsal joints with bony spurs in the dorsal aspect. Extensive arterial calcifications are seen in the soft tissues. There is mild hallux valgus deformity. IMPRESSION: No fracture or dislocation is seen. There are no focal lytic lesions. Plantar spur is seen in calcaneus. Electronically Signed   By: Elmer Picker M.D.   On: 05/17/2021 13:43   ECHOCARDIOGRAM COMPLETE  Result Date: 05/17/2021    ECHOCARDIOGRAM REPORT   Patient Name:   Courtney Gray Date of Exam: 05/17/2021 Medical Rec #:  [id number]       Height:       65.0 in Accession #:    [id number]      Weight:       375.2 lb Date of Birth:  1970/11/24        BSA:          2.585 m Patient Age:    51 years        BP:           127/83 mmHg Patient Gender: F               HR:           70 bpm. Exam Location:  Inpatient Procedure: 2D Echo, Cardiac Doppler, Color Doppler and Intracardiac            Opacification Agent Indications:    NSTEMI  History:        Patient  has prior history of Echocardiogram examinations, most                 recent 04/23/2021. Risk Factors:Hypertension, Diabetes and                 Dyslipidemia. ESRD.  Sonographer:    Clayton Lefort RDCS (AE) Referring Phys: [id number] Leanne Chang [facility]  Sonographer Comments: Patient is morbidly obese. Image acquisition challenging due to patient body habitus. IMPRESSIONS  1. Left ventricular ejection fraction, by estimation, is 55 to 60%. The left ventricle has normal function. The left ventricle has no regional wall motion abnormalities. There is moderate concentric left ventricular hypertrophy. Left ventricular diastolic parameters are consistent with Grade II diastolic dysfunction (pseudonormalization). Elevated left atrial pressure.  2. Right ventricular systolic function is normal. The right ventricular size is normal. Tricuspid regurgitation signal is inadequate for assessing PA pressure.  3. Left atrial size was mildly dilated.  4. The mitral valve is degenerative. Mild mitral valve regurgitation. No evidence of mitral stenosis. The mean mitral valve gradient is 3.0 mmHg with average heart rate of 68 bpm. Moderate to severe mitral annular calcification.  5. The aortic valve is tricuspid. There is moderate calcification of the aortic valve. There is moderate thickening of the aortic valve. Aortic valve regurgitation is not visualized. Mild aortic valve stenosis. Aortic valve mean gradient measures 13.0 mmHg. Aortic valve Vmax measures 2.45 m/s. Comparison(s): Prior images reviewed side by side. There is now mild aortic stenosis. FINDINGS  Left Ventricle: Left ventricular ejection fraction, by estimation, is 55 to 60%. The left ventricle has normal function. The left ventricle has no regional wall motion abnormalities. Definity contrast agent was given IV to delineate the left ventricular  endocardial borders. The left ventricular internal cavity size was normal in size. There is moderate concentric left ventricular hypertrophy. Left ventricular diastolic parameters are consistent with Grade II diastolic dysfunction (pseudonormalization).  Elevated left atrial pressure. Right Ventricle: The right  ventricular size is normal. No increase in right ventricular wall thickness. Right ventricular systolic function is normal. Tricuspid regurgitation signal is inadequate for assessing PA pressure. Left Atrium: Left atrial size was mildly dilated. Right Atrium: Right atrial size was normal in size. Pericardium: There is no evidence of pericardial effusion. Mitral Valve: The mitral valve is degenerative in appearance. Moderate to severe mitral annular calcification. Mild mitral valve regurgitation. No evidence of mitral valve stenosis. MV peak gradient, 6.7 mmHg. The mean mitral valve gradient is 3.0 mmHg with average heart rate of 68 bpm. Tricuspid Valve: The tricuspid valve is normal in structure. Tricuspid valve regurgitation is trivial. Aortic Valve: The aortic valve is tricuspid. There is moderate calcification of the aortic valve. There is moderate thickening of the aortic valve. Aortic valve regurgitation is not visualized. Mild aortic stenosis is present. Aortic valve mean gradient measures 13.0 mmHg. Aortic valve peak gradient measures 24.0 mmHg. Aortic valve area, by VTI measures 1.58 cm. Pulmonic Valve: The pulmonic valve was grossly normal. Pulmonic valve regurgitation is not visualized. Aorta: The aortic root is normal in size and structure. IAS/Shunts: No atrial level shunt detected by color flow Doppler.  LEFT VENTRICLE PLAX 2D LVIDd:         5.00 cm   Diastology LVIDs:         3.40 cm   LV e' medial:    5.70 cm/s LV PW:         1.60 cm   LV E/e' medial:  20.7 LV IVS:  1.80 cm   LV e' lateral:   7.58 cm/s LVOT diam:     2.16 cm   LV E/e' lateral: 15.6 LV SV:         81 LV SV Index:   31 LVOT Area:     3.66 cm  RIGHT VENTRICLE RV Basal diam:  3.30 cm RV S prime:     17.70 cm/s TAPSE (M-mode): 3.3 cm LEFT ATRIUM              Index        RIGHT ATRIUM           Index LA diam:        3.00 cm  1.16 cm/m   RA Area:     14.60 cm LA Vol (A2C):   104.0 ml 40.24 ml/m  RA Volume:   37.00 ml  14.32 ml/m  LA Vol (A4C):   67.4 ml  26.08 ml/m LA Biplane Vol: 83.6 ml  32.35 ml/m  AORTIC VALVE AV Area (Vmax):    1.47 cm AV Area (Vmean):   1.41 cm AV Area (VTI):     1.58 cm AV Vmax:           245.00 cm/s AV Vmean:          168.000 cm/s AV VTI:            0.516 m AV Peak Grad:      24.0 mmHg AV Mean Grad:      13.0 mmHg LVOT Vmax:         98.50 cm/s LVOT Vmean:        64.600 cm/s LVOT VTI:          0.222 m LVOT/AV VTI ratio: 0.43  AORTA Ao Root diam: 3.30 cm Ao Asc diam:  3.40 cm MITRAL VALVE MV Area (PHT): 2.41 cm     SHUNTS MV Peak grad:  6.7 mmHg     Systemic VTI:  0.22 m MV Mean grad:  3.0 mmHg     Systemic Diam: 2.16 cm MV Vmax:       1.29 m/s MV Vmean:      83.9 cm/s MV Decel Time: 315 msec MV E velocity: 118.00 cm/s MV A velocity: 108.00 cm/s MV E/A ratio:  1.09 Mihai Croitoru MD Electronically signed by Sanda Klein MD Signature Date/Time: 05/17/2021/1:29:46 PM    Final     Patient Profile     51 y.o. female with past medical history of end-stage renal disease dialysis dependent, hypertension, diabetes mellitus, hyperlipidemia with non-ST elevation myocardial infarction.  Assessment & Plan    1 non-ST elevation myocardial infarction -She had chest discomfort yesterday but was relieved with belching.  At bedtime troponin is trending downward.  EKG was nonischemic.  She has had no further chest pain.-I will get a stat EKG and send off a set of at hs troponin -2D echo showed normal LV function with EF 55 to 60% no focal wall motion abnormalities.  There is moderate LVH and grade 2 diastolic dysfunction. -Continue aspirin 81 mg daily and atorvastatin 10 mg daily  -Hold carvedilol due to soft blood pressure  -We will stop Imdur for now due to soft blood pressure -Patient is not on heparin given recent rectal bleeding.   -Plan is for left heart cath tomorrow Shared Decision Making/Informed Consent for The risks [stroke (1 in 1000), death (1 in 1000), kidney failure [usually temporary] (1 in 500),  bleeding (1 in 200), allergic reaction [possibly  serious] (1 in 200)], benefits (diagnostic support and management of coronary artery disease) and alternatives of a cardiac catheterization were discussed in detail with Ms. Waldman and she is willing to proceed.   2 rectal bleeding -GI feels this is hemorrhoidal bleeding but still having moderate blood in the toilet and on washcloths when wiping -Hemoglobin dropped from 8.2-7.9 this morning  -Plan is for CT with oral contrast to replace colonoscopy  3 end-stage renal disease -Nephrology following  4 anemia -likely secondary to renal insufficiency with possible contribution from rectal bleeding.   -Hemoglobin has increased from 7.9-8.3 this morning  5 hypertension -BP is well controlled on exam and actually soft today -Carvedilol held last night and this morning.  Will defer blood pressure management to her nephrologist  6 morbid obesity-needs weight loss.  7 aortic valve stenosis -Mild by echo this admission with mean aortic valve gradient 13 mmHg  I have spent a total of 35 minutes with patient reviewing 2D echo, telemetry, EKGs, labs and examining patient as well as establishing an assessment and plan that was discussed with the patient.  > 50% of time was spent in direct patient care.     For questions or updates, please contact [place] Please consult [email or web address] for contact info under        Signed, Fransico Him, MD  05/19/2021, 8:01 AM
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
## 2021-05-20 ENCOUNTER — Encounter (HOSPITAL_COMMUNITY)
Admission: EM | Disposition: A | Discharge status: Home / Self Care | Payer: Self-pay | Source: Home / Self Care | Attending: Internal Medicine

## 2021-05-20 DIAGNOSIS — K649 Unspecified hemorrhoids: Secondary | ICD-10-CM | POA: Diagnosis present

## 2021-05-20 DIAGNOSIS — I214 Non-ST elevation (NSTEMI) myocardial infarction: Secondary | ICD-10-CM | POA: Diagnosis not present

## 2021-05-20 DIAGNOSIS — N186 End stage renal disease: Secondary | ICD-10-CM | POA: Diagnosis not present

## 2021-05-20 DIAGNOSIS — Z6841 Body Mass Index (BMI) 40.0 and over, adult: Secondary | ICD-10-CM

## 2021-05-20 DIAGNOSIS — E1169 Type 2 diabetes mellitus with other specified complication: Secondary | ICD-10-CM

## 2021-05-20 DIAGNOSIS — K625 Hemorrhage of anus and rectum: Secondary | ICD-10-CM | POA: Diagnosis not present

## 2021-05-20 LAB — COMPREHENSIVE METABOLIC PANEL WITH GFR
ALT: 13 U/L (ref 0–44)
AST: 15 U/L (ref 15–41)
Albumin: 3.1 g/dL — ABNORMAL LOW (ref 3.5–5.0)
Alkaline Phosphatase: 57 U/L (ref 38–126)
Anion gap: 14 (ref 5–15)
BUN: 58 mg/dL — ABNORMAL HIGH (ref 6–20)
CO2: 24 mmol/L (ref 22–32)
Calcium: 8.7 mg/dL — ABNORMAL LOW (ref 8.9–10.3)
Chloride: 96 mmol/L — ABNORMAL LOW (ref 98–111)
Creatinine, Ser: 13.96 mg/dL — ABNORMAL HIGH (ref 0.44–1.00)
GFR, Estimated: 3 mL/min — ABNORMAL LOW
Glucose, Bld: 287 mg/dL — ABNORMAL HIGH (ref 70–99)
Potassium: 4.9 mmol/L (ref 3.5–5.1)
Sodium: 134 mmol/L — ABNORMAL LOW (ref 135–145)
Total Bilirubin: 0.5 mg/dL (ref 0.3–1.2)
Total Protein: 7.3 g/dL (ref 6.5–8.1)

## 2021-05-20 LAB — GLUCOSE, CAPILLARY
Glucose-Capillary: 240 mg/dL — ABNORMAL HIGH (ref 70–99)
Glucose-Capillary: 130 mg/dL — ABNORMAL HIGH (ref 70–99)
Glucose-Capillary: 170 mg/dL — ABNORMAL HIGH (ref 70–99)
Glucose-Capillary: 160 mg/dL — ABNORMAL HIGH (ref 70–99)

## 2021-05-20 LAB — CBC WITH DIFFERENTIAL/PLATELET
Abs Immature Granulocytes: 0.07 10*3/uL (ref 0.00–0.07)
Basophils Absolute: 0.1 10*3/uL (ref 0.0–0.1)
Basophils Relative: 1 %
Eosinophils Absolute: 0.4 10*3/uL (ref 0.0–0.5)
Eosinophils Relative: 4 %
HCT: 25.8 % — ABNORMAL LOW (ref 36.0–46.0)
Hemoglobin: 7.7 g/dL — ABNORMAL LOW (ref 12.0–15.0)
Immature Granulocytes: 1 %
Lymphocytes Relative: 13 %
Lymphs Abs: 1.3 10*3/uL (ref 0.7–4.0)
MCH: 30 pg (ref 26.0–34.0)
MCHC: 29.8 g/dL — ABNORMAL LOW (ref 30.0–36.0)
MCV: 100.4 fL — ABNORMAL HIGH (ref 80.0–100.0)
Monocytes Absolute: 0.9 10*3/uL (ref 0.1–1.0)
Monocytes Relative: 10 %
Neutro Abs: 6.8 10*3/uL (ref 1.7–7.7)
Neutrophils Relative %: 71 %
Platelets: 167 10*3/uL (ref 150–400)
RBC: 2.57 MIL/uL — ABNORMAL LOW (ref 3.87–5.11)
RDW: 15.9 % — ABNORMAL HIGH (ref 11.5–15.5)
WBC: 9.5 10*3/uL (ref 4.0–10.5)
nRBC: 0 % (ref 0.0–0.2)

## 2021-05-20 LAB — MAGNESIUM: Magnesium: 2.2 mg/dL (ref 1.7–2.4)

## 2021-05-20 LAB — PHOSPHORUS: Phosphorus: 8.3 mg/dL — ABNORMAL HIGH (ref 2.5–4.6)

## 2021-05-20 LAB — TROPONIN I (HIGH SENSITIVITY): Troponin I (High Sensitivity): 302 ng/L (ref <18)

## 2021-05-20 LAB — URIC ACID: Uric Acid, Serum: 6.4 mg/dL (ref 2.5–7.1)

## 2021-05-20 SURGERY — LEFT HEART CATH AND CORONARY ANGIOGRAPHY
Anesthesia: LOCAL

## 2021-05-20 MED ORDER — NALOXEGOL OXALATE 25 MG PO TABS: 25.0000 mg | ORAL_TABLET | Freq: Every day | ORAL | Status: DC

## 2021-05-20 MED ORDER — HYDROCORTISONE ACETATE 25 MG RE SUPP
25.0000 mg | Freq: Two times a day (BID) | RECTAL | Status: DC
  Administered 2021-05-20: 25 mg via RECTAL
  Filled 2021-05-20 (×11): qty 1

## 2021-05-20 MED ORDER — PEG-KCL-NACL-NASULF-NA ASC-C 100 G PO SOLR
1.0000 | Freq: Once | ORAL | Status: AC
  Administered 2021-05-20: 200 g via ORAL
  Filled 2021-05-20: qty 1

## 2021-05-20 MED ORDER — LINACLOTIDE 145 MCG PO CAPS
145.0000 ug | ORAL_CAPSULE | Freq: Every day | ORAL | Status: DC
  Administered 2021-05-21: 145 ug via ORAL
  Filled 2021-05-20 (×2): qty 1

## 2021-05-20 MED ORDER — ASPIRIN 81 MG PO CHEW
81.0000 mg | CHEWABLE_TABLET | ORAL | Status: AC
  Administered 2021-05-20: 81 mg via ORAL
  Filled 2021-05-20: qty 1

## 2021-05-20 MED ORDER — SIMETHICONE 80 MG PO CHEW
80.0000 mg | CHEWABLE_TABLET | Freq: Four times a day (QID) | ORAL | Status: DC | PRN
  Administered 2021-05-20 – 2021-05-23 (×4): 80 mg via ORAL
  Filled 2021-05-20 (×5): qty 1

## 2021-05-20 MED ORDER — SODIUM CHLORIDE 0.9 % IV SOLN: INTRAVENOUS | Status: DC

## 2021-05-20 MED ORDER — ASPIRIN 81 MG PO CHEW: 81.0000 mg | CHEWABLE_TABLET | ORAL | Status: DC

## 2021-05-20 NOTE — Assessment & Plan Note (Addendum)
Gastroenterology now signed off and deferring colonoscopy to outpatient setting.  Continue Anusol.  Outpatient follow-up with GI scheduled on 06/11/2021
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
## 2021-05-20 NOTE — Progress Notes (Addendum)
Progress Note  Patient Name: Courtney Gray Date of Encounter: 05/20/2021  Primary Cardiologist: None   Subjective   Patient is seen at bedside with the daughter.  She reports a bloody bowel movement this morning which she has to strain.  States that the blood was dark red.  Her last BM was last Thursday.  She is concerned about the bleeding and would not proceed with a heart cath and to bleeding is resolved.  She expresses frustration with her weight.  Say that she has gained significant weight due to her medications which she did not remember the names.  We talked briefly about multidisciplinary approach to her weight loss.  Inpatient Medications    Scheduled Meds:  aspirin  81 mg Oral Daily   atorvastatin  10 mg Oral QHS   calcitRIOL  1 mcg Oral Q T,Th,Sa-HD   calcium carbonate  1,500 mg Oral TID WC   Chlorhexidine Gluconate Cloth  6 each Topical Daily   gabapentin  100 mg Oral Once per day on Tue Thu Sat   gabapentin  100 mg Oral 2 times per day on Sun Mon Wed Fri   insulin aspart  0-5 Units Subcutaneous QHS   insulin aspart  0-6 Units Subcutaneous TID WC   midodrine  5 mg Oral BID WC   pantoprazole  40 mg Oral Daily   polyethylene glycol  17 g Oral BID   senna-docusate  1 tablet Oral BID   sodium chloride flush  3 mL Intravenous Q12H   sodium chloride flush  3 mL Intravenous Q12H   tiZANidine  4 mg Oral QHS   Continuous Infusions:  sodium chloride     sodium chloride     sodium chloride     [START ON 05/23/2021] ferric gluconate (FERRLECIT) IVPB     sodium chloride     PRN Meds: sodium chloride, acetaminophen, calcium carbonate, HYDROcodone-acetaminophen, nitroGLYCERIN, ondansetron **OR** ondansetron (ZOFRAN) IV, simethicone, sodium chloride flush, witch hazel-glycerin   Vital Signs    Vitals:   05/19/21 1425 05/19/21 1635 05/19/21 2000 05/20/21 0735  BP: (!) 98/41 135/60 (!) 137/58 124/89  Pulse:   78   Resp:   20   Temp:   98.8 F (37.1 C)   TempSrc:    Oral   SpO2:   100%   Weight:      Height:        Intake/Output Summary (Last 24 hours) at 05/20/2021 0849 Last data filed at 05/19/2021 1703 Gross per 24 hour  Intake 480 ml  Output --  Net 480 ml   Filed Weights   05/17/21 2134 05/18/21 1415 05/18/21 2100  Weight: (!) 168.3 kg (!) 170.5 kg (!) 166.4 kg    Telemetry    Sinus rhythm- Personally Reviewed  ECG    2/13: Normal sinus rhythm- Personally Reviewed  Physical Exam   GEN: No acute distress. On home O2 3 L Neck: Difficult to visualize due to her body habitus Cardiac: RRR, no murmurs, rubs, or gallops.  Respiratory: Clear to auscultation bilaterally. MS: Trace edema of bilateral lower extremity.  Warm to touch. Neuro:  Nonfocal  Psych: Normal affect   Labs    Chemistry Recent Labs  Lab 05/18/21 0148 05/19/21 0404 05/20/21 0618  NA 134* 134* 134*  K 4.7 4.7 4.9  CL 97* 95* 96*  CO2 25 26 24   GLUCOSE 236* 187* 287*  BUN 47* 39* 58*  CREATININE 11.57* 10.92* 13.96*  CALCIUM 8.6* 9.6 8.7*  PROT  7.0 7.9 7.3  ALBUMIN 3.1* 3.3* 3.1*  AST 14* 14* 15  ALT 14 14 13   ALKPHOS 59 64 57  BILITOT 0.3 0.4 0.5  GFRNONAA 4* 4* 3*  ANIONGAP 12 13 14      Hematology Recent Labs  Lab 05/18/21 0148 05/18/21 0149 05/19/21 0404 05/20/21 0618  WBC 11.4*  --  11.3* 9.5  RBC 2.62* 2.61* 2.84* 2.57*  HGB 7.9*  --  8.3* 7.7*  HCT 26.1*  --  28.1* 25.8*  MCV 99.6  --  98.9 100.4*  MCH 30.2  --  29.2 30.0  MCHC 30.3  --  29.5* 29.8*  RDW 16.1*  --  15.9* 15.9*  PLT 187  --  176 167    Cardiac EnzymesNo results for input(s): TROPONINI in the last 168 hours. No results for input(s): TROPIPOC in the last 168 hours.   BNPNo results for input(s): BNP, PROBNP in the last 168 hours.   DDimer No results for input(s): DDIMER in the last 168 hours.   Radiology    CT ABDOMEN PELVIS WO CONTRAST  Result Date: 05/19/2021 CLINICAL DATA:  Rectal bleeding, rule out GMS EXAM: CT ABDOMEN AND PELVIS WITHOUT CONTRAST  TECHNIQUE: Multidetector CT imaging of the abdomen and pelvis was performed following the standard protocol without IV contrast. RADIATION DOSE REDUCTION: This exam was performed according to the departmental dose-optimization program which includes automated exposure control, adjustment of the mA and/or kV according to patient size and/or use of iterative reconstruction technique. COMPARISON:  09/02/2020 FINDINGS: Lower chest: No acute abnormality. Extensive three-vessel artery calcifications. Hepatobiliary: No solid liver abnormality is seen. No gallstones, gallbladder wall thickening, or biliary dilatation. Pancreas: Unremarkable. No pancreatic ductal dilatation or surrounding inflammatory changes. Spleen: Normal in size without significant abnormality. Adrenals/Urinary Tract: Adrenal glands are unremarkable. Very atrophic bilateral kidneys. No calculi or hydronephrosis. Bladder is unremarkable. Stomach/Bowel: Stomach is within normal limits. Appendix appears normal. No evidence of bowel wall thickening, distention, or inflammatory changes. Previously ingested barium contrast admixed with stool in the distal colon and rectum. Vascular/Lymphatic: Aortic atherosclerosis and vascular calcinosis. No enlarged abdominal or pelvic lymph nodes. Reproductive: No mass or other significant abnormality. Other: No abdominal wall hernia or abnormality. No ascites. Musculoskeletal: No acute or significant osseous findings. IMPRESSION: 1. Previously ingested barium contrast admixed with stool in the distal colon and rectum. Within this limitation, no acute noncontrast CT findings of the abdomen or pelvis to explain or localize rectal bleeding. 2. Very atrophic bilateral kidneys. 3. Coronary artery disease. Aortic Atherosclerosis (ICD10-I70.0). Electronically Signed   By: Delanna Ahmadi M.D.   On: 05/19/2021 18:02    Cardiac Studies   ECHOCARDIOGRAM COMPLETE  Result Date: 05/17/2021    ECHOCARDIOGRAM REPORT   Patient Name:    Courtney Gray Date of Exam: 05/17/2021 Medical Rec #:  [id number]       Height:       65.0 in Accession #:    [id number]      Weight:       375.2 lb Date of Birth:  [birth year]        BSA:          2.585 m Patient Age:    51 years        BP:           127/83 mmHg Patient Gender: F               HR:           70 bpm.  Exam Location:  Inpatient Procedure: 2D Echo, Cardiac Doppler, Color Doppler and Intracardiac            Opacification Agent Indications:    NSTEMI  History:        Patient has prior history of Echocardiogram examinations, most                 recent 04/23/2021. Risk Factors:Hypertension, Diabetes and                 Dyslipidemia. ESRD.  Sonographer:    Clayton Lefort RDCS (AE) Referring Phys: [id number] Leanne Chang [facility]  Sonographer Comments: Patient is morbidly obese. Image acquisition challenging due to patient body habitus. IMPRESSIONS  1. Left ventricular ejection fraction, by estimation, is 55 to 60%. The left ventricle has normal function. The left ventricle has no regional wall motion abnormalities. There is moderate concentric left ventricular hypertrophy. Left ventricular diastolic parameters are consistent with Grade II diastolic dysfunction (pseudonormalization). Elevated left atrial pressure.  2. Right ventricular systolic function is normal. The right ventricular size is normal. Tricuspid regurgitation signal is inadequate for assessing PA pressure.  3. Left atrial size was mildly dilated.  4. The mitral valve is degenerative. Mild mitral valve regurgitation. No evidence of mitral stenosis. The mean mitral valve gradient is 3.0 mmHg with average heart rate of 68 bpm. Moderate to severe mitral annular calcification.  5. The aortic valve is tricuspid. There is moderate calcification of the aortic valve. There is moderate thickening of the aortic valve. Aortic valve regurgitation is not visualized. Mild aortic valve stenosis. Aortic valve mean gradient measures 13.0 mmHg. Aortic valve Vmax measures  2.45 m/s. Comparison(s): Prior images reviewed side by side. There is now mild aortic stenosis.   Patient Profile     51 year old female with past medical history of ESRD on HD, hypertension, hyperlipidemia, type 2 diabetes, who was admitted for rectal bleeding.  Currently evaluated for NSTEMI.  Assessment & Plan    NSTEMI  Troponin peak at 1820 down to 600.  EKG throughout this admission has been sinus rhythm without any ST changes.  Echocardiogram this admission was unremarkable with normal EF and no regional wall abnormalities.  No significant valvular dysfunction.  No heparin due to GI bleed. -Original plan was LHC today.  However patient declined due to a bloody bowel movement this morning.  GI was reconsulted. -Continue holding Coreg, Imdur and lisinopril given hypotension.  Blood pressure improved today with midodrine.  If evidence of CAD on cath, will try to slowly resume GDMT. -Continue aspirin.   Hematochezia Likely hemorrhoidal bleeding per GI.  The toilet bowl appears dark red, suspect old bleed.  Hemoglobin dropped slightly from 8.3-7.7 today. -Pending GI evaluation. -Trend hemoglobin -She will need better bowel regiment to prevent straining  ESRD on HD (TTS) She no longer makes urine.  She had 3 L output with HD on 2/11.  HD again tomorrow.  Electrolytes WNL.  Morbid obesity We talked briefly about multidisciplinary approach to help with weight problem. -She will need weight loss counseling after discharge  Hyperlipidemia LDL 40 -Continue atorvastatin 10 mg  Type 2 diabetes -Per primary team   For questions or updates, please contact Sophia Please consult [email or web address] for contact info under Cardiology/STEMI.      Signed, Gaylan Gerold, DO  05/20/2021, 8:49 AM     I have seen and examined the patient along with Gaylan Gerold, DO .  I have reviewed the chart, notes and new data.  I agree with his note.  Key new complaints: No active chest pain or  dyspnea.  Had a bloody stool earlier this morning. Key examination changes: Super-morbidly obese.  Limited cardiovascular examination possible, but no definitive abnormalities detected.  Dialysis catheter and PICC line in place.  Nonfunctioning AV fistula right upper extremity.  No edema.  Clear lungs.  Unable to evaluate JVD. Key new findings / data: Mild-moderate rise and fall pattern of high-sensitivity troponin I, peak around 1800.  Normal ECG.  No wall motion abnormalities on echocardiogram.  PLAN: She has had a small NSTEMI by biomarkers and did have chest pain earlier.  Unfortunately, ECG and echocardiogram do not give any clues as to the extent of possibly ischemic myocardium that remains in jeopardy.  Not a good candidate for noninvasive studies such as CT angiography or nuclear perfusion studies due to BMI over 60.   Note extensive coronary calcifications incidentally seen on CT of the abdomen pelvis, as well as extensive, possibly even occlusive disease in the mesenteric arteries. Best test to assess coronary status is invasive coronary angiography, preferably via radial approach to reduce risk of bleeding, not withstanding her end-stage renal disease status. However, I do not think we should proceed to any percutaneous intervention with angioplasty or stent placement until we know for sure there is no serious active bleeding from the gastrointestinal tract. In my opinion, the safest way to proceed would be to perform a diagnostic coronary angiogram first, to make sure that she is not in danger of major cardiovascular events during colonoscopy.  If the diagnostic study shows low risk findings that do not require revascularization, we will focus on medical therapy only.  If angiographic findings suggest that she needs percutaneous revascularization, would have to wait until GI bleeding source has healed.  Sanda Klein, MD, [place] [phone] 05/20/2021, 11:36 AM

## 2021-05-20 NOTE — Progress Notes (Signed)
[facility] Progress Note  Subjective: pt seen and examined bedside. Daughter at bedside as well. Had a bloody BM this AM. Had issues with low BP (typically happens towards the end of her outpatient HD treatments) over the weekend which improved.  Vitals:   05/19/21 1425 05/19/21 1635 05/19/21 2000 05/20/21 0735  BP: (!) 98/41 135/60 (!) 137/58 124/89  Pulse:   78   Resp:   20   Temp:   98.8 F (37.1 C)   TempSrc:   Oral   SpO2:   100%   Weight:      Height:        Exam: Gen: NAD, sitting up in chair Rext: cta bl Cardiac: s1s2, rrr Abd: soft ntnd no mass or ascites +bs obese Ext: trace edema b/l Neuro: alert, awake, moves all ext spontaneously, speech clear and coherent Dialysis access: [facility], old nonfunctioning RUE AVF     Recent Labs  Lab 05/19/21 0404 05/20/21 0618  K 4.7 4.9  BUN 39* 58*  CREATININE 10.92* 13.96*  ALBUMIN 3.3* 3.1*  CALCIUM 9.6 8.7*  PHOS 6.8* 8.3*  HGB 8.3* 7.7*   Inpatient medications:  aspirin  81 mg Oral Daily   atorvastatin  10 mg Oral QHS   calcitRIOL  1 mcg Oral Q T,Th,Sa-HD   calcium carbonate  1,500 mg Oral TID WC   Chlorhexidine Gluconate Cloth  6 each Topical Daily   gabapentin  100 mg Oral Once per day on Tue Thu Sat   gabapentin  100 mg Oral 2 times per day on Sun Mon Wed Fri   insulin aspart  0-5 Units Subcutaneous QHS   insulin aspart  0-6 Units Subcutaneous TID WC   midodrine  5 mg Oral BID WC   pantoprazole  40 mg Oral Daily   polyethylene glycol  17 g Oral BID   senna-docusate  1 tablet Oral BID   sodium chloride flush  3 mL Intravenous Q12H   sodium chloride flush  3 mL Intravenous Q12H   tiZANidine  4 mg Oral QHS    sodium chloride     sodium chloride     sodium chloride     [START ON 05/23/2021] ferric gluconate (FERRLECIT) IVPB     sodium chloride     sodium chloride, acetaminophen, calcium carbonate, HYDROcodone-acetaminophen, nitroGLYCERIN, ondansetron **OR** ondansetron (ZOFRAN) IV,  simethicone, sodium chloride flush, witch hazel-glycerin   Home meds include - norvasc 10, coreg, lipitor, neurontin, glipizide , imdur, lantus insulin, lisinopril 20mg  prn, sl ntg prn, protonix, prns/ vits/ supp     OP HD: TTS [name]  4h 74min   400/500  165kg  1K/2.5Ca bath  TDC /(not using AVF) Hep 2600 + 1500 unit /hr  - rocaltrol 1.0 ug tiw  - mircera 200 ug q 2 wks, last 2/03  - venofer 50 mg weekly       CXR 2/09 - borderline edema     Assessment/ Plan: NSTEMI - per cards. Plan is for LHC once eval'd by GI DOE - due to vol overload, had early edema by CXR and was up 5kg on admission. Has had HD x 2 here w/ 5 L total net UF, wt's are down close to dry wt, 1kg up. Next HD Tuesday. Lower vol if BP's tolerate HypOtension - imdur dc' and got NS bolus due to low BP in the 80's. BP's have been high last 2 days but are now low, possibly due to HD/ vol lowering vs ischemia  vs other. Coreg d/c'ed also now and start midodrine. BP better today. LHC pending ESRD - on HD TTS.  Had HD here Fri and Sat. Next HD Tuesday 2/14 to get back on TTS schedule.  DM2 - per pmd Anemia ckd, hematochezia - next esa due on 2/17. Cont weekly IV fe. GI to see patient, hgb down to 7.7 MBD ckd - Ca in range, add on phos. Cont vdra, ?binder.    Gean Quint, MD [facility]

## 2021-05-20 NOTE — Progress Notes (Signed)
Pt has decided she does not want to complete Movie prep as ordered by GI MD for constipation tonight d/t having cardiac cath in am. I had conversation with her and her daughter regarding why this was ordered-which they both understand, but would prefer to wait until after procedure tomorrow am. Pt is very anxious about cardiac cath, and also concerned she will be unable to rest tonight d/t frequent need to go to bathroom, as well as developing loose stools pre procedure. Pt has multiple complaints about care/caregivers /MD's during this hospitalization which I will pass on to Unit leadership to address. Jessie Foot, RN

## 2021-05-20 NOTE — H&P (Signed)
ESRD on dialysis Recent rectal bleeding Non-ST elevation MI with coronary angio being done to define anatomy

## 2021-05-20 NOTE — Progress Notes (Signed)
Pt had complained of Chest pain after pt was pulled up in bed. EKG done, nothing significant changes noted. BP 152/133. Will recheck. Dr. Chana Bode, Dr.Nguyen updated. New order received for troponin level.

## 2021-05-20 NOTE — Care Management Important Message (Signed)
Important Message  Patient Details  Name: Courtney Gray MRN: [id number] Date of Birth: 10/10/1970   Medicare Important Message Given:  Yes     Shelda Altes 05/20/2021, 9:23 AM

## 2021-05-20 NOTE — Progress Notes (Addendum)
Attending physician's note   I have taken a history, reviewed the chart, and examined the patient. I performed a substantive portion of this encounter, including complete performance of at least one of the key components, in conjunction with the APP. I agree with the APP's note, impression, and recommendations with my edits.   I had a long conversation with the patient and her daughter at bedside today.  Bloody BM this a.m. with straining.  Last BM was 4 days ago.  She does report a longstanding history of chronic constipation, but other than using MiraLAX x3 days at 1 point in the past, does not take any laxatives, stool softeners, etc. regularly.  H/H otherwise largely stable at 7.7/25.8.  We discussed diagnostic and treatment options.  I discussed the elevated risks of colonoscopy and sedation with the patient today.  Clinically seems most c/w hemorrhoidal bleeding or possibly stercoral ulcer.  I also discussed her care with her inpatient Cardiologist.  We are in agreement that diagnostic cardiac catheterization tomorrow takes precedence.  If significant findings that require cardiac intervention and subsequent DAPT, plan for subsequent inpatient colonoscopy which would then allow for percutaneous cardiovascular revascularization.  I discussed that with the patient and her daughter today, and they are also in agreement.  - Okay for diet today from my standpoint - Agree with bowel preparation for treatment of chronic constipation - Will start Linzess - Will be okay to restart MiraLAX 3-7 days after starting Linzess if needed to maintain soft stools - Anusol suppositories x7-10 days - Timing of colonoscopy depending on cardiac catheterization.  If no significant CV disease that requires revascularization, can defer colonoscopy to a later date after recovery    Gerrit Heck, DO, FACG [phone] office          Progress Note   Subjective  Chief Complaint: Constipation, rectal  bleeding  Today, the patient tells me that she had her first bowel movement in about 5 days this morning and had to strain a lot.  She saw bright red blood in the toilet with her stool (which is still there at time of my interview).  Since then she has not had anything else.  Tells me she is frustrated with ongoing bleeding and wants to be on a better regimen for constipation because "MiraLAX is not working".  Tells me she is tired as she did not get good rest and everybody keeps telling her about the same problems including her heart.  Tells me she is slightly overwhelmed.  Denies any new abdominal pain but tells me she feels full up.  Apparently has history of IBS-C as an outpatient for which she uses juice, she has never been on medicines.   Objective   Vital signs in last 24 hours: Temp:  [98.8 F (37.1 C)] 98.8 F (37.1 C) (02/12 2000) Pulse Rate:  [78] 78 (02/12 2000) Resp:  [20] 20 (02/12 2000) BP: (98-137)/(41-89) 124/89 (02/13 0735) SpO2:  [100 %] 100 % (02/12 2000) Last BM Date: 05/16/21 General:    Morbidly obese African-American female in NAD Heart:  Regular rate and rhythm; no murmurs Lungs: Respirations even and unlabored, lungs CTA bilaterally Abdomen:  Soft, mild generalized TTP and nondistended. Normal bowel sounds. Psych:  Cooperative. Normal mood and affect.  Intake/Output from previous day: 02/12 0701 - 02/13 0700 In: 480 [P.O.:480] Out: -   Lab Results: Recent Labs    05/18/21 0148 05/19/21 0404 05/20/21 0618  WBC 11.4* 11.3* 9.5  HGB 7.9* 8.3* 7.7*  HCT 26.1* 28.1* 25.8*  PLT 187 176 167   BMET Recent Labs    05/18/21 0148 05/19/21 0404 05/20/21 0618  NA 134* 134* 134*  K 4.7 4.7 4.9  CL 97* 95* 96*  CO2 25 26 24   GLUCOSE 236* 187* 287*  BUN 47* 39* 58*  CREATININE 11.57* 10.92* 13.96*  CALCIUM 8.6* 9.6 8.7*   LFT Recent Labs    05/20/21 0618  PROT 7.3  ALBUMIN 3.1*  AST 15  ALT 13  ALKPHOS 57  BILITOT 0.5    Studies/Results: CT  ABDOMEN PELVIS WO CONTRAST  Result Date: 05/19/2021 CLINICAL DATA:  Rectal bleeding, rule out GMS EXAM: CT ABDOMEN AND PELVIS WITHOUT CONTRAST TECHNIQUE: Multidetector CT imaging of the abdomen and pelvis was performed following the standard protocol without IV contrast. RADIATION DOSE REDUCTION: This exam was performed according to the departmental dose-optimization program which includes automated exposure control, adjustment of the mA and/or kV according to patient size and/or use of iterative reconstruction technique. COMPARISON:  09/02/2020 FINDINGS: Lower chest: No acute abnormality. Extensive three-vessel artery calcifications. Hepatobiliary: No solid liver abnormality is seen. No gallstones, gallbladder wall thickening, or biliary dilatation. Pancreas: Unremarkable. No pancreatic ductal dilatation or surrounding inflammatory changes. Spleen: Normal in size without significant abnormality. Adrenals/Urinary Tract: Adrenal glands are unremarkable. Very atrophic bilateral kidneys. No calculi or hydronephrosis. Bladder is unremarkable. Stomach/Bowel: Stomach is within normal limits. Appendix appears normal. No evidence of bowel wall thickening, distention, or inflammatory changes. Previously ingested barium contrast admixed with stool in the distal colon and rectum. Vascular/Lymphatic: Aortic atherosclerosis and vascular calcinosis. No enlarged abdominal or pelvic lymph nodes. Reproductive: No mass or other significant abnormality. Other: No abdominal wall hernia or abnormality. No ascites. Musculoskeletal: No acute or significant osseous findings. IMPRESSION: 1. Previously ingested barium contrast admixed with stool in the distal colon and rectum. Within this limitation, no acute noncontrast CT findings of the abdomen or pelvis to explain or localize rectal bleeding. 2. Very atrophic bilateral kidneys. 3. Coronary artery disease. Aortic Atherosclerosis (ICD10-I70.0). Electronically Signed   By: Delanna Ahmadi  M.D.   On: 05/19/2021 18:02      Assessment / Plan:   Assessment: 1.  Hematochezia, secondary to hemorrhoids versus anal fissure: Another episode this morning with a bowel movement for which she strained 2.  End-stage renal disease on dialysis 3.  Morbid obesity 4.  NSTEMI  Plan: 1.  No plans for emergent endoscopic procedures.  At some point in the future may benefit from a colonoscopy pending results of her cardiac cath as an outpatient 2.  Ordered movi prep to be completed today. 3.  Placed orders for heart healthy diet and n.p.o. at midnight (cardiology has plans for cath tomorrow) 4.  Stopped Senokot and MiraLAX as patient tells me these are not working.  We will try Linzess 145 mcg daily which will start tomorrow after bowel prep today 5.  Ordered Hydrocortisone (Anusol) 25 mg suppositories twice daily times a week   With no plans for colonoscopy we will likely sign off.  Patient can follow-up in outpatient clinic to adjust her bowel regimen as needed. Let us know if we can be of any further assistance.    LOS: 4 days   Levin Erp  05/20/2021, 12:44 PM

## 2021-05-21 ENCOUNTER — Encounter (HOSPITAL_COMMUNITY)
Admission: EM | Disposition: A | Discharge status: Home / Self Care | Payer: Self-pay | Source: Home / Self Care | Attending: Internal Medicine

## 2021-05-21 ENCOUNTER — Telehealth: Payer: Self-pay

## 2021-05-21 DIAGNOSIS — E875 Hyperkalemia: Secondary | ICD-10-CM | POA: Diagnosis present

## 2021-05-21 DIAGNOSIS — N185 Chronic kidney disease, stage 5: Secondary | ICD-10-CM | POA: Diagnosis not present

## 2021-05-21 DIAGNOSIS — D72829 Elevated white blood cell count, unspecified: Secondary | ICD-10-CM

## 2021-05-21 DIAGNOSIS — K625 Hemorrhage of anus and rectum: Secondary | ICD-10-CM | POA: Diagnosis not present

## 2021-05-21 DIAGNOSIS — I214 Non-ST elevation (NSTEMI) myocardial infarction: Secondary | ICD-10-CM | POA: Diagnosis not present

## 2021-05-21 DIAGNOSIS — K649 Unspecified hemorrhoids: Secondary | ICD-10-CM | POA: Diagnosis not present

## 2021-05-21 LAB — CBC WITH DIFFERENTIAL/PLATELET
Abs Immature Granulocytes: 0.08 10*3/uL — ABNORMAL HIGH (ref 0.00–0.07)
Basophils Absolute: 0.1 10*3/uL (ref 0.0–0.1)
Basophils Relative: 0 %
Eosinophils Absolute: 0.5 10*3/uL (ref 0.0–0.5)
Eosinophils Relative: 5 %
HCT: 26.7 % — ABNORMAL LOW (ref 36.0–46.0)
Hemoglobin: 7.9 g/dL — ABNORMAL LOW (ref 12.0–15.0)
Immature Granulocytes: 1 %
Lymphocytes Relative: 11 %
Lymphs Abs: 1.3 10*3/uL (ref 0.7–4.0)
MCH: 29.3 pg (ref 26.0–34.0)
MCHC: 29.6 g/dL — ABNORMAL LOW (ref 30.0–36.0)
MCV: 98.9 fL (ref 80.0–100.0)
Monocytes Absolute: 1.1 10*3/uL — ABNORMAL HIGH (ref 0.1–1.0)
Monocytes Relative: 9 %
Neutro Abs: 8.7 10*3/uL — ABNORMAL HIGH (ref 1.7–7.7)
Neutrophils Relative %: 74 %
Platelets: 183 10*3/uL (ref 150–400)
RBC: 2.7 MIL/uL — ABNORMAL LOW (ref 3.87–5.11)
RDW: 15.6 % — ABNORMAL HIGH (ref 11.5–15.5)
WBC: 11.7 10*3/uL — ABNORMAL HIGH (ref 4.0–10.5)
nRBC: 0 % (ref 0.0–0.2)

## 2021-05-21 LAB — COMPREHENSIVE METABOLIC PANEL
ALT: 14 U/L (ref 0–44)
AST: 15 U/L (ref 15–41)
Albumin: 3.4 g/dL — ABNORMAL LOW (ref 3.5–5.0)
Alkaline Phosphatase: 58 U/L (ref 38–126)
Anion gap: 16 — ABNORMAL HIGH (ref 5–15)
BUN: 72 mg/dL — ABNORMAL HIGH (ref 6–20)
CO2: 22 mmol/L (ref 22–32)
Calcium: 9.1 mg/dL (ref 8.9–10.3)
Chloride: 94 mmol/L — ABNORMAL LOW (ref 98–111)
Creatinine, Ser: 16.61 mg/dL — ABNORMAL HIGH (ref 0.44–1.00)
GFR, Estimated: 2 mL/min — ABNORMAL LOW (ref >60)
Glucose, Bld: 239 mg/dL — ABNORMAL HIGH (ref 70–99)
Potassium: 5.3 mmol/L — ABNORMAL HIGH (ref 3.5–5.1)
Sodium: 132 mmol/L — ABNORMAL LOW (ref 135–145)
Total Bilirubin: 0.4 mg/dL (ref 0.3–1.2)
Total Protein: 7.8 g/dL (ref 6.5–8.1)

## 2021-05-21 LAB — TROPONIN I (HIGH SENSITIVITY): Troponin I (High Sensitivity): 326 ng/L (ref <18)

## 2021-05-21 LAB — GLUCOSE, CAPILLARY
Glucose-Capillary: 206 mg/dL — ABNORMAL HIGH (ref 70–99)
Glucose-Capillary: 134 mg/dL — ABNORMAL HIGH (ref 70–99)
Glucose-Capillary: 113 mg/dL — ABNORMAL HIGH (ref 70–99)
Glucose-Capillary: 196 mg/dL — ABNORMAL HIGH (ref 70–99)

## 2021-05-21 LAB — MAGNESIUM: Magnesium: 2.4 mg/dL (ref 1.7–2.4)

## 2021-05-21 LAB — PHOSPHORUS: Phosphorus: 9.1 mg/dL — ABNORMAL HIGH (ref 2.5–4.6)

## 2021-05-21 SURGERY — LEFT HEART CATH AND CORONARY ANGIOGRAPHY
Anesthesia: LOCAL

## 2021-05-21 MED ORDER — SODIUM CHLORIDE 0.9 % IV SOLN: INTRAVENOUS | Status: DC

## 2021-05-21 MED ORDER — SEVELAMER CARBONATE 800 MG PO TABS
800.0000 mg | ORAL_TABLET | Freq: Three times a day (TID) | ORAL | Status: DC
  Administered 2021-05-21: 800 mg via ORAL
  Filled 2021-05-21 (×2): qty 1

## 2021-05-21 MED ORDER — GABAPENTIN 100 MG PO CAPS
100.0000 mg | ORAL_CAPSULE | Freq: Once | ORAL | Status: AC
  Administered 2021-05-21: 100 mg via ORAL
  Filled 2021-05-21: qty 1

## 2021-05-21 MED ORDER — LIDOCAINE HCL (PF) 1 % IJ SOLN
INTRAMUSCULAR | Status: AC
  Filled 2021-05-21: qty 30

## 2021-05-21 MED ORDER — PANTOPRAZOLE SODIUM 20 MG PO TBEC
20.0000 mg | DELAYED_RELEASE_TABLET | Freq: Once | ORAL | Status: AC
  Administered 2021-05-21: 20 mg via ORAL
  Filled 2021-05-21: qty 1

## 2021-05-21 MED ORDER — VERAPAMIL HCL 2.5 MG/ML IV SOLN
INTRAVENOUS | Status: AC
  Filled 2021-05-21: qty 2

## 2021-05-21 MED ORDER — HEPARIN (PORCINE) IN NACL 1000-0.9 UT/500ML-% IV SOLN
INTRAVENOUS | Status: AC
  Filled 2021-05-21: qty 1000

## 2021-05-21 NOTE — Assessment & Plan Note (Addendum)
Patient with elevated potassium, received Lokelma and improved/resolved with HD.  Potassium 4.7 at time of discharge.

## 2021-05-21 NOTE — Progress Notes (Signed)
Consult for R IJ "line kinked". Red lumen with sluggish blood return. Brisk blood return noted from purple lumen. Lab drawn. Line noted to be bent with dressing near clavicle. Recommended dressing change to remove the mechanical occlusion in the line. Pt in agreement. Dressing change done, line now secured toward posterior neck, removing 2 bends in the catheter.

## 2021-05-21 NOTE — Assessment & Plan Note (Signed)
-  WBC went from 9.5 -> 11.7 -Likely reactive -Continue Motrin trend for signs and symptoms of infection -Repeat CBC in a.m.

## 2021-05-21 NOTE — Progress Notes (Signed)
[facility] Progress Note  Subjective: pt seen and examined bedside. Daughter at bedside as well. Started bowel prep---has been having a lot of diarrhea and now with some rectal/anal pain, unable to sit for a long time. She wants to hold off on LHC because of her frequent BM's. She also wants to delay HD to later today for the same reasons.  Vitals:   05/20/21 1253 05/20/21 2023 05/21/21 0002 05/21/21 0453  BP:  (!) 161/98 (!) 157/55 (!) 140/57  Pulse: 78 90 76 69  Resp: 18     Temp: 98.3 F (36.8 C) 99.7 F (37.6 C)  97.6 F (36.4 C)  TempSrc: Oral Oral  Oral  SpO2: 100% 99% 100% 97%  Weight:      Height:        Exam: Gen: NAD, sitting up in chair Rext: cta bl Cardiac: s1s2, rrr Abd: soft ntnd no mass or ascites +bs obese Ext: trace edema b/l Neuro: alert, awake, moves all ext spontaneously, speech clear and coherent Dialysis access: [facility], old nonfunctioning RUE AVF     Recent Labs  Lab 05/20/21 0618 05/21/21 0638  K 4.9 5.3*  BUN 58* 72*  CREATININE 13.96* 16.61*  ALBUMIN 3.1* 3.4*  CALCIUM 8.7* 9.1  PHOS 8.3* 9.1*  HGB 7.7* 7.9*   Inpatient medications:  aspirin  81 mg Oral Daily   atorvastatin  10 mg Oral QHS   calcitRIOL  1 mcg Oral Q T,Th,Sa-HD   calcium carbonate  1,500 mg Oral TID WC   Chlorhexidine Gluconate Cloth  6 each Topical Daily   gabapentin  100 mg Oral Once per day on Tue Thu Sat   gabapentin  100 mg Oral 2 times per day on Sun Mon Wed Fri   hydrocortisone  25 mg Rectal BID   insulin aspart  0-5 Units Subcutaneous QHS   insulin aspart  0-6 Units Subcutaneous TID WC   linaclotide  145 mcg Oral QAC breakfast   midodrine  5 mg Oral BID WC   pantoprazole  40 mg Oral Daily   sodium chloride flush  3 mL Intravenous Q12H   sodium chloride flush  3 mL Intravenous Q12H   tiZANidine  4 mg Oral QHS    sodium chloride     sodium chloride     sodium chloride 10 mL/hr at 05/21/21 0624   [START ON 05/23/2021] ferric gluconate  (FERRLECIT) IVPB     sodium chloride     sodium chloride, acetaminophen, calcium carbonate, HYDROcodone-acetaminophen, nitroGLYCERIN, ondansetron **OR** ondansetron (ZOFRAN) IV, simethicone, sodium chloride flush, witch hazel-glycerin   Home meds include - norvasc 10, coreg, lipitor, neurontin, glipizide , imdur, lantus insulin, lisinopril 20mg  prn, sl ntg prn, protonix, prns/ vits/ supp     OP HD: TTS [name]  4h 22min   400/500  165kg  1K/2.5Ca bath  TDC /(not using AVF) Hep 2600 + 1500 unit /hr  - rocaltrol 1.0 ug tiw  - mircera 200 ug q 2 wks, last 2/03  - venofer 50 mg weekly       CXR 2/09 - borderline edema     Assessment/ Plan: NSTEMI - per cards. LHC pending DOE - due to vol overload, had early edema by CXR and was up 5kg on admission. Has had HD x 2 here w/ 5 L total net UF, wt's are down close to dry wt, 1kg up. UF as tolerated HypOtension - imdur dc' and got NS bolus due to low BP in the  80's over the weekend. Bps are now stable, low BP possibly due to HD/ vol lowering vs ischemia vs other. Coreg d/c'ed also now and started midodrine. BP better today. LHC pending ESRD - on HD TTS.  Had HD here Fri and Sat. HD later today. Recommend renal diet. DM2 - per pmd Anemia ckd, hematochezia - next esa due on 2/17. Cont weekly IV fe. GI following, hgb stable 7.9 MBD ckd - Ca in range, phos high. Cont vdra, on tums tidac, starting renvela  Gean Quint, MD [employer]

## 2021-05-21 NOTE — Progress Notes (Signed)
°   05/21/21 1400  Clinical Encounter Type  Visited With Patient and family together  Visit Type Follow-up  Consult/Referral To None   Chaplain visited with patient to see how she was doing. On my first visit the patient shared many decisions she was facing. Today she was a confident of what is taking place. Patient shared some of her concerns about what her new normal will look like when she returns home. She expressed concern for her daughter with the added stress she was going to have. Patient is learning to advocate for herself realizing it is important to do so, and both her and her daughter realize that self care is going to be more critical that it has ever been for one another  Lindsay Resident [facility] [phone]
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
## 2021-05-21 NOTE — Telephone Encounter (Signed)
Pt scheduled to see Dr. Candis Schatz 06/11/21 at 11:10am. Letter mailed to pt.

## 2021-05-21 NOTE — Telephone Encounter (Signed)
-----   Message from [name], MD sent at 05/19/2021  9:05 PM EST ----- Courtney Gray,  Can you get a follow up clinic appointment for this patient (hematochezia)?

## 2021-05-21 NOTE — Progress Notes (Addendum)
Progress Note  Patient Name: Courtney Gray Date of Encounter: 05/21/2021  Primary Cardiologist: None   Subjective   Patient is seen this morning with her daughter. She in acute distress due to anal pain. She reports a small BM early this morning at 4 AM which was bloody. She then had another large BM later that was not bloody. States that her back side is hurting after the bowel movement. She is concerned about have diarrhea this AM while getting her heart cath.   Endorses SOB due to anal pain but denies any CP.   Inpatient Medications    Scheduled Meds:  aspirin  81 mg Oral Daily   atorvastatin  10 mg Oral QHS   calcitRIOL  1 mcg Oral Q T,Th,Sa-HD   calcium carbonate  1,500 mg Oral TID WC   Chlorhexidine Gluconate Cloth  6 each Topical Daily   gabapentin  100 mg Oral Once per day on Tue Thu Sat   gabapentin  100 mg Oral 2 times per day on Sun Mon Wed Fri   hydrocortisone  25 mg Rectal BID   insulin aspart  0-5 Units Subcutaneous QHS   insulin aspart  0-6 Units Subcutaneous TID WC   linaclotide  145 mcg Oral QAC breakfast   midodrine  5 mg Oral BID WC   pantoprazole  40 mg Oral Daily   sodium chloride flush  3 mL Intravenous Q12H   sodium chloride flush  3 mL Intravenous Q12H   tiZANidine  4 mg Oral QHS   Continuous Infusions:  sodium chloride     sodium chloride     sodium chloride 10 mL/hr at 05/21/21 0624   [START ON 05/23/2021] ferric gluconate (FERRLECIT) IVPB     sodium chloride     PRN Meds: sodium chloride, acetaminophen, calcium carbonate, HYDROcodone-acetaminophen, nitroGLYCERIN, ondansetron **OR** ondansetron (ZOFRAN) IV, simethicone, sodium chloride flush, witch hazel-glycerin   Vital Signs    Vitals:   05/20/21 1253 05/20/21 2023 05/21/21 0002 05/21/21 0453  BP:  (!) 161/98 (!) 157/55 (!) 140/57  Pulse: 78 90 76 69  Resp: 18     Temp: 98.3 F (36.8 C) 99.7 F (37.6 C)  97.6 F (36.4 C)  TempSrc: Oral Oral  Oral  SpO2: 100% 99% 100% 97%  Weight:       Height:       No intake or output data in the 24 hours ending 05/21/21 0837 Filed Weights   05/17/21 2134 05/18/21 1415 05/18/21 2100  Weight: (!) 168.3 kg (!) 170.5 kg (!) 166.4 kg    Telemetry    NSR - Personally Reviewed  ECG    2/14 - NSR, mild ST depression of V5-6 - Personally Reviewed  Physical Exam   GEN: In acute distress.   Cardiac: RRR, no murmurs, rubs, or gallops.  Respiratory: Clear to auscultation bilaterally. MS: trace edema bilat LE. Extremities warm to touch.  Neuro:  Nonfocal  Psych: Normal affect   Labs    Chemistry Recent Labs  Lab 05/19/21 0404 05/20/21 0618 05/21/21 0638  NA 134* 134* 132*  K 4.7 4.9 5.3*  CL 95* 96* 94*  CO2 26 24 22   GLUCOSE 187* 287* 239*  BUN 39* 58* 72*  CREATININE 10.92* 13.96* 16.61*  CALCIUM 9.6 8.7* 9.1  PROT 7.9 7.3 7.8  ALBUMIN 3.3* 3.1* 3.4*  AST 14* 15 15  ALT 14 13 14   ALKPHOS 64 57 58  BILITOT 0.4 0.5 0.4  GFRNONAA 4* 3* 2*  ANIONGAP 13 14 16*     Hematology Recent Labs  Lab 05/19/21 0404 05/20/21 0618 05/21/21 0638  WBC 11.3* 9.5 11.7*  RBC 2.84* 2.57* 2.70*  HGB 8.3* 7.7* 7.9*  HCT 28.1* 25.8* 26.7*  MCV 98.9 100.4* 98.9  MCH 29.2 30.0 29.3  MCHC 29.5* 29.8* 29.6*  RDW 15.9* 15.9* 15.6*  PLT 176 167 183    Cardiac EnzymesNo results for input(s): TROPONINI in the last 168 hours. No results for input(s): TROPIPOC in the last 168 hours.   BNPNo results for input(s): BNP, PROBNP in the last 168 hours.   DDimer No results for input(s): DDIMER in the last 168 hours.   Radiology    CT ABDOMEN PELVIS WO CONTRAST  Result Date: 05/19/2021 CLINICAL DATA:  Rectal bleeding, rule out GMS EXAM: CT ABDOMEN AND PELVIS WITHOUT CONTRAST TECHNIQUE: Multidetector CT imaging of the abdomen and pelvis was performed following the standard protocol without IV contrast. RADIATION DOSE REDUCTION: This exam was performed according to the departmental dose-optimization program which includes automated  exposure control, adjustment of the mA and/or kV according to patient size and/or use of iterative reconstruction technique. COMPARISON:  09/02/2020 FINDINGS: Lower chest: No acute abnormality. Extensive three-vessel artery calcifications. Hepatobiliary: No solid liver abnormality is seen. No gallstones, gallbladder wall thickening, or biliary dilatation. Pancreas: Unremarkable. No pancreatic ductal dilatation or surrounding inflammatory changes. Spleen: Normal in size without significant abnormality. Adrenals/Urinary Tract: Adrenal glands are unremarkable. Very atrophic bilateral kidneys. No calculi or hydronephrosis. Bladder is unremarkable. Stomach/Bowel: Stomach is within normal limits. Appendix appears normal. No evidence of bowel wall thickening, distention, or inflammatory changes. Previously ingested barium contrast admixed with stool in the distal colon and rectum. Vascular/Lymphatic: Aortic atherosclerosis and vascular calcinosis. No enlarged abdominal or pelvic lymph nodes. Reproductive: No mass or other significant abnormality. Other: No abdominal wall hernia or abnormality. No ascites. Musculoskeletal: No acute or significant osseous findings. IMPRESSION: 1. Previously ingested barium contrast admixed with stool in the distal colon and rectum. Within this limitation, no acute noncontrast CT findings of the abdomen or pelvis to explain or localize rectal bleeding. 2. Very atrophic bilateral kidneys. 3. Coronary artery disease. Aortic Atherosclerosis (ICD10-I70.0). Electronically Signed   By: Delanna Ahmadi M.D.   On: 05/19/2021 18:02    Cardiac Studies   Result Date: 05/17/2021    ECHOCARDIOGRAM REPORT   Patient Name:   Courtney Gray Date of Exam: 05/17/2021 Medical Rec #:  [id number]       Height:       65.0 in Accession #:    [id number]      Weight:       375.2 lb Date of Birth:  1970-07-23        BSA:          2.585 m Patient Age:    51 years        BP:           127/83 mmHg Patient Gender: F                HR:           70 bpm. Exam Location:  Inpatient Procedure: 2D Echo, Cardiac Doppler, Color Doppler and Intracardiac            Opacification Agent Indications:    NSTEMI  History:        Patient has prior history of Echocardiogram examinations, most  recent 04/23/2021. Risk Factors:Hypertension, Diabetes and                 Dyslipidemia. ESRD.  Sonographer:    Clayton Lefort RDCS (AE) Referring Phys: [id number] Leanne Chang [facility]  Sonographer Comments: Patient is morbidly obese. Image acquisition challenging due to patient body habitus. IMPRESSIONS  1. Left ventricular ejection fraction, by estimation, is 55 to 60%. The left ventricle has normal function. The left ventricle has no regional wall motion abnormalities. There is moderate concentric left ventricular hypertrophy. Left ventricular diastolic parameters are consistent with Grade II diastolic dysfunction (pseudonormalization). Elevated left atrial pressure.  2. Right ventricular systolic function is normal. The right ventricular size is normal. Tricuspid regurgitation signal is inadequate for assessing PA pressure.  3. Left atrial size was mildly dilated.  4. The mitral valve is degenerative. Mild mitral valve regurgitation. No evidence of mitral stenosis. The mean mitral valve gradient is 3.0 mmHg with average heart rate of 68 bpm. Moderate to severe mitral annular calcification.  5. The aortic valve is tricuspid. There is moderate calcification of the aortic valve. There is moderate thickening of the aortic valve. Aortic valve regurgitation is not visualized. Mild aortic valve stenosis. Aortic valve mean gradient measures 13.0 mmHg. Aortic valve Vmax measures 2.45 m/s. Comparison(s): Prior images reviewed side by side. There is now mild aortic stenosis.   Patient Profile     51 year old female with past medical history of ESRD on HD, hypertension, hyperlipidemia, type 2 diabetes, who was admitted for rectal bleeding.  Currently being  evaluated for NSTEMI.   Assessment & Plan    NSTEMI  Patient needs a LHC to rule out severe CAD causing her chest pain. She is not a candidate for other methods such as CTA due to body habitus. Will not perform any intervention until GI bleed stabilizes. If there is no significant obstruction, she may proceed with a colonoscopy.  -Her LHC this AM was delayed due to her GI issue. Will check with the cath lab about her schedule.  -Continue holding Coreg, Imdur and lisinopril given hypotension.  Blood pressure improved today with midodrine.  If evidence of CAD on cath, will try to slowly resume GDMT. -Continue aspirin.    Hematochezia Likely hemorrhoidal bleeding per GI.  Hgb stable today. She now has anal pain due to excessive straining this AM.  -Trend hemoglobin -Continue bowel regimen to prevent straining   ESRD on HD (TTS) She no longer makes urine.  She had 3 L output with HD on 2/11. The original plan is to get HD after LHC but now LHC is delayed. Will discuss with the team about her schedule. She does not appear volume overloaded on exam today   Morbid obesity She will need weight loss counseling after discharge   Hyperlipidemia LDL 40 -Continue atorvastatin 10 mg   Type 2 diabetes -Per primary team   For questions or updates, please contact [place] Please consult [email or web address] for contact info under Cardiology/STEMI.      Signed, Gaylan Gerold, DO  05/21/2021, 8:37 AM     I have seen and examined the patient along with Gaylan Gerold, DO.  I have reviewed the chart, notes and new data.  I agree with PA/NP's note.  Key new complaints: Complains of runny bowel movement and perirectal pain.  No chest pain.  Denies shortness of breath. Key examination changes: No change Key new findings / data: Labs consistent with renal failure with creatinine 16.6, BUN 72 and  borderline abnormal potassium at 5.3.  Troponin flat.  Hemoglobin 7.9.  PLAN: Mrs. Massmann has decided that  she will not have cardiac catheterization today due to her intestinal issues. Despite my efforts to explain how complicated it is to set up this necessary procedure due to her multiple comorbid conditions, she has decided to cancel the procedure for today. This is the second time this has happened.  Regardless of the reasons, it is leading to what I consider to be unnecessary delays in her care, as well as complicating the logistics of already complex coordination between the invasive cardiology lab, the hemodialysis unit and even the gastroenterology service.  Sanda Klein, MD, [place] [phone] 05/21/2021, 12:05 PM

## 2021-05-22 DIAGNOSIS — J9611 Chronic respiratory failure with hypoxia: Secondary | ICD-10-CM

## 2021-05-22 DIAGNOSIS — I214 Non-ST elevation (NSTEMI) myocardial infarction: Secondary | ICD-10-CM | POA: Diagnosis not present

## 2021-05-22 DIAGNOSIS — N185 Chronic kidney disease, stage 5: Secondary | ICD-10-CM | POA: Diagnosis not present

## 2021-05-22 DIAGNOSIS — K625 Hemorrhage of anus and rectum: Secondary | ICD-10-CM | POA: Diagnosis not present

## 2021-05-22 DIAGNOSIS — K649 Unspecified hemorrhoids: Secondary | ICD-10-CM

## 2021-05-22 DIAGNOSIS — M79671 Pain in right foot: Secondary | ICD-10-CM

## 2021-05-22 DIAGNOSIS — M79672 Pain in left foot: Secondary | ICD-10-CM

## 2021-05-22 LAB — CBC WITH DIFFERENTIAL/PLATELET
Abs Immature Granulocytes: 0.04 10*3/uL (ref 0.00–0.07)
Basophils Absolute: 0.1 10*3/uL (ref 0.0–0.1)
Basophils Relative: 0 %
Eosinophils Absolute: 0.6 10*3/uL — ABNORMAL HIGH (ref 0.0–0.5)
Eosinophils Relative: 5 %
HCT: 25.5 % — ABNORMAL LOW (ref 36.0–46.0)
Hemoglobin: 7.4 g/dL — ABNORMAL LOW (ref 12.0–15.0)
Immature Granulocytes: 0 %
Lymphocytes Relative: 10 %
Lymphs Abs: 1.2 10*3/uL (ref 0.7–4.0)
MCH: 29.1 pg (ref 26.0–34.0)
MCHC: 29 g/dL — ABNORMAL LOW (ref 30.0–36.0)
MCV: 100.4 fL — ABNORMAL HIGH (ref 80.0–100.0)
Monocytes Absolute: 1 10*3/uL (ref 0.1–1.0)
Monocytes Relative: 9 %
Neutro Abs: 8.7 10*3/uL — ABNORMAL HIGH (ref 1.7–7.7)
Neutrophils Relative %: 76 %
Platelets: 179 10*3/uL (ref 150–400)
RBC: 2.54 MIL/uL — ABNORMAL LOW (ref 3.87–5.11)
RDW: 15.6 % — ABNORMAL HIGH (ref 11.5–15.5)
WBC: 11.6 10*3/uL — ABNORMAL HIGH (ref 4.0–10.5)
nRBC: 0 % (ref 0.0–0.2)

## 2021-05-22 LAB — COMPREHENSIVE METABOLIC PANEL
ALT: 13 U/L (ref 0–44)
AST: 13 U/L — ABNORMAL LOW (ref 15–41)
Albumin: 3.2 g/dL — ABNORMAL LOW (ref 3.5–5.0)
Alkaline Phosphatase: 55 U/L (ref 38–126)
Anion gap: 15 (ref 5–15)
BUN: 84 mg/dL — ABNORMAL HIGH (ref 6–20)
CO2: 23 mmol/L (ref 22–32)
Calcium: 8.8 mg/dL — ABNORMAL LOW (ref 8.9–10.3)
Chloride: 96 mmol/L — ABNORMAL LOW (ref 98–111)
Creatinine, Ser: 18.74 mg/dL — ABNORMAL HIGH (ref 0.44–1.00)
GFR, Estimated: 2 mL/min — ABNORMAL LOW (ref >60)
Glucose, Bld: 206 mg/dL — ABNORMAL HIGH (ref 70–99)
Potassium: 6.2 mmol/L — ABNORMAL HIGH (ref 3.5–5.1)
Sodium: 134 mmol/L — ABNORMAL LOW (ref 135–145)
Total Bilirubin: 0.5 mg/dL (ref 0.3–1.2)
Total Protein: 7.5 g/dL (ref 6.5–8.1)

## 2021-05-22 LAB — GLUCOSE, CAPILLARY
Glucose-Capillary: 130 mg/dL — ABNORMAL HIGH (ref 70–99)
Glucose-Capillary: 163 mg/dL — ABNORMAL HIGH (ref 70–99)
Glucose-Capillary: 140 mg/dL — ABNORMAL HIGH (ref 70–99)

## 2021-05-22 LAB — MAGNESIUM: Magnesium: 2.5 mg/dL — ABNORMAL HIGH (ref 1.7–2.4)

## 2021-05-22 LAB — PHOSPHORUS: Phosphorus: 10.6 mg/dL — ABNORMAL HIGH (ref 2.5–4.6)

## 2021-05-22 MED ORDER — SODIUM ZIRCONIUM CYCLOSILICATE 10 G PO PACK
10.0000 g | PACK | Freq: Once | ORAL | Status: AC
  Administered 2021-05-22: 10 g via ORAL
  Filled 2021-05-22: qty 1

## 2021-05-22 MED ORDER — TIZANIDINE HCL 4 MG PO TABS
4.0000 mg | ORAL_TABLET | Freq: Two times a day (BID) | ORAL | Status: DC
  Administered 2021-05-22 – 2021-05-24 (×4): 4 mg via ORAL
  Filled 2021-05-22 (×5): qty 1

## 2021-05-22 MED ORDER — CARVEDILOL 25 MG PO TABS: 25.0000 mg | ORAL_TABLET | Freq: Every day | ORAL | Status: DC | PRN

## 2021-05-22 MED ORDER — CARVEDILOL 25 MG PO TABS
25.0000 mg | ORAL_TABLET | ORAL | Status: DC
  Administered 2021-05-24: 25 mg via ORAL
  Filled 2021-05-22: qty 1

## 2021-05-22 MED ORDER — ALTEPLASE 2 MG IJ SOLR
INTRAMUSCULAR | Status: AC
  Administered 2021-05-22: 4 mg
  Filled 2021-05-22: qty 4

## 2021-05-22 NOTE — Progress Notes (Signed)
[facility] Progress Note   Subjective:   Patient seen and examined at bedside in HD.  Tolerating well so far.  Heart cath cancelled by patient due to diarrhea, also why dialysis postponed until today.  Diarrhea improved today.  Reports mild CP.  Denies SOB, abdominal pain, and n/v.  Objective Vitals:   05/21/21 0002 05/21/21 0453 05/21/21 1945 05/22/21 0745  BP: (!) 157/55 (!) 140/57 (!) 148/85 (!) 198/62  Pulse: 76 69 68 67  Resp:   18 19  Temp:  97.6 F (36.4 C) 98.3 F (36.8 C) 98.5 F (36.9 C)  TempSrc:  Oral Oral   SpO2: 100% 97% 100% 100%  Weight:    (!) 169.5 kg  Height:       Physical Exam General:chronically ill appearing, obese female in NAD Heart:RRR, no mrg Lungs:CTAB anteriorly  Abdomen:soft, NTND Extremities:trace LE edema Dialysis Access: TDC in use   Filed Weights   05/18/21 1415 05/18/21 2100 05/22/21 0745  Weight: (!) 170.5 kg (!) 166.4 kg (!) 169.5 kg    Intake/Output Summary (Last 24 hours) at 05/22/2021 0815 Last data filed at 05/22/2021 0300 Gross per 24 hour  Intake 578.58 ml  Output --  Net 578.58 ml    Additional Objective Labs: Basic Metabolic Panel: Recent Labs  Lab 05/20/21 0618 05/21/21 0638 05/22/21 0606  NA 134* 132* 134*  K 4.9 5.3* 6.2*  CL 96* 94* 96*  CO2 24 22 23   GLUCOSE 287* 239* 206*  BUN 58* 72* 84*  CREATININE 13.96* 16.61* 18.74*  CALCIUM 8.7* 9.1 8.8*  PHOS 8.3* 9.1* 10.6*   Liver Function Tests: Recent Labs  Lab 05/20/21 0618 05/21/21 0638 05/22/21 0606  AST 15 15 13*  ALT 13 14 13   ALKPHOS 57 58 55  BILITOT 0.5 0.4 0.5  PROT 7.3 7.8 7.5  ALBUMIN 3.1* 3.4* 3.2*   No results for input(s): LIPASE, AMYLASE in the last 168 hours. CBC: Recent Labs  Lab 05/18/21 0148 05/19/21 0404 05/20/21 0618 05/21/21 0638 05/22/21 0606  WBC 11.4* 11.3* 9.5 11.7* 11.6*  NEUTROABS 8.9* 8.5* 6.8 8.7* 8.7*  HGB 7.9* 8.3* 7.7* 7.9* 7.4*  HCT 26.1* 28.1* 25.8* 26.7* 25.5*  MCV 99.6 98.9 100.4* 98.9  100.4*  PLT 187 176 167 183 179   CBG: Recent Labs  Lab 05/20/21 2145 05/21/21 0738 05/21/21 1154 05/21/21 1551 05/21/21 2054  GLUCAP 160* 206* 134* 113* 196*   IMedications:  sodium chloride     sodium chloride     sodium chloride 10 mL/hr at 05/21/21 0624   [START ON 05/23/2021] ferric gluconate (FERRLECIT) IVPB     sodium chloride      aspirin  81 mg Oral Daily   atorvastatin  10 mg Oral QHS   calcitRIOL  1 mcg Oral Q T,Th,Sa-HD   calcium carbonate  1,500 mg Oral TID WC   Chlorhexidine Gluconate Cloth  6 each Topical Daily   gabapentin  100 mg Oral Once per day on Tue Thu Sat   gabapentin  100 mg Oral 2 times per day on Sun Mon Wed Fri   hydrocortisone  25 mg Rectal BID   insulin aspart  0-5 Units Subcutaneous QHS   insulin aspart  0-6 Units Subcutaneous TID WC   linaclotide  145 mcg Oral QAC breakfast   midodrine  5 mg Oral BID WC   pantoprazole  40 mg Oral Daily   sevelamer carbonate  800 mg Oral TID WC   sodium chloride flush  3 mL Intravenous Q12H   sodium chloride flush  3 mL Intravenous Q12H   sodium zirconium cyclosilicate  10 g Oral Once   tiZANidine  4 mg Oral QHS    Dialysis Orders: TTS [name]  4h 59min   400/500  165kg  1K/2.5Ca bath  TDC /(not using AVF) Hep 2600 + 1500 unit /hr  - rocaltrol 1.0 ug tiw  - mircera 200 ug q 2 wks, last 2/03  - venofer 50 mg weekly       CXR 2/09 - borderline edema     Assessment/ Plan: NSTEMI - per cards. LHC pending DOE - due to vol overload, had early edema by CXR and was up 5kg on admission. 4kg over dry if weight correct, UF as tolerated.  HypOtension - imdur dc' and got NS bolus due to low BP in the 80's over the weekend. BP elevated this AM.  Midodrine held.  Should improve post HD.  May need to add back coreg. LHC pending ESRD - on HD TTS.  HD today off schedule.  Will resume regular schedule tomorrow if possible.  DM2 - per pmd Anemia ckd, hematochezia - next esa due on 2/17. Cont weekly IV fe. GI  following, hgb drop 7.4. MBD ckd - Ca in range, phos high. Cont vdra, on tums tidac, starting renvela Nutrition - renal diet w/fluid restrictions.   Jen Mow, PA-C [facility] 05/22/2021,8:15 AM  LOS: 6 days

## 2021-05-22 NOTE — Progress Notes (Signed)
Progress Note   Patient: Courtney Gray SEG:[id number] DOB: Aug 16, 1970 DOA: 05/16/2021     6 DOS: the patient was seen and examined on 05/22/2021   Brief hospital course: Courtney Gray is a 51 year old female with past medical history significant for ESRD on HD TTS, type 2 diabetes mellitus, essential hypertension, hyperlipidemia, neuropathy, IBS, anemia of chronic renal disease, morbid obesity who presented to the ED on 2/9 with reported rectal bleeding, persistent chest pain.  Patient reports 2 episodes of bleeding with no presence of clots, bright red in color.  Denies black stools and denies NSAID abuse.  No history of colonoscopy in the past.  No recent medication changes.  In addition, patient with continued chest pain since recent discharge, midsternal and present both at rest and with exertion; nonradiating with associated shortness of breath.  Denies lower extremity edema.  In the ED, temperature 98.8 F, HR 72, RR 25, SBP 160.  SPO2 100% on room air.  Sodium 130, potassium 5.1, chloride 93, CO2 24, glucose 166, BUN 71, creatinine 14.11, AST 14, ALT 17, total bilirubin 0.7.  WBC 12.7, hemoglobin 8.5, platelets 217.  COVID-19 PCR negative.  Influenza A/B PCR negative.  Chest x-ray with cardiomegaly, no alveolar pulmonary edema or focal consolidation.  EDP performed rectal exam with findings of hemorrhoids.  Cardiology was consulted and recommended transfer to [facility] for further evaluation and GI consultation for GI bleeding.  TRH consulted for further evaluation and management of rectal bleeding and chest pain.  Assessment and Plan: * NSTEMI (non-ST elevated myocardial infarction) ([place])- (present on admission) Patient presenting with persistent chest pain, constant, nonradiating localized to her anterior chest associated with shortness of breath. hs troponin 837>1355>[id number]>652>625>302>326.  TTE 2/10 with LVEF 55-60%, LV normal function, no regional wall motion abnormalities, moderate  concentric LVH, grade 2 diastolic dysfunction, LA mildly dilated, moderate/severe mitral valve alveolar calcification with mild MV regurgitation, mild aortic valve stenosis. --Cardiology following, appreciate assistance --Carvedilol 25 mg p.o. twice daily on Sun/Mon/Wed/Fri --Cardiology plans LHC; patient has refused on 2 occasions so far during hospitalization due to GI symptoms/diarrhea; now looks like cardiology deferring heart catheterization to outpatient --Continue to monitor on telemetry --Further per cardiology  Rectal bleeding- (present on admission) Patient reported episodes of rectal bleeding on admission, onset 2 days prior to hospitalization.  Patient with history of IBS, constipation predominant with EDP findings of hemorrhoids on rectal examination.  No previous history of colonoscopy in the past.  See abdomen/pelvis without contrast with no findings to explain localized rectal bleeding.  Seen by [facility]; and to defer colonoscopy to outpatient unless patient will require stenting with DAPT after LHC; then will plan inpatient colonoscopy. --[facility] following, appreciate assistance --Continue to closely monitor bowel movements, hemoglobin  ESRD (end stage renal disease) ([place])- (present on admission) Patient dialyzes on a Tuesday/Thursday/Saturday schedule outpatient. --Nephrology following, appreciate assistance --Renal diet, strict I's and O's, daily weights --HD today   HTN (hypertension)- (present on admission) Home regimen includes carvedilol, Imdur, amlodipine, lisinopril. --Continue carvedilol 25 mg p.o. twice daily on Sun/Mon/Wed/Fri --Holding home Imdur, amlodipine, lisinopril for borderline hypotension --Continue aspirin and statin --Continue monitor BP closely  Chronic respiratory failure with hypoxia (HCC)- (present on admission) --Continue home 2 L per nasal cannula   Anemia of chronic renal failure- (present on admission) Anemia panel iron level 25, TIBC  179, saturation ratios of 14%, ferritin level of 833, 4 level 9.2, vitamin B12 642 --Nephrology plans ferric gluconate 62.5 mg IV every Thursday --Repeat  CBC in a.m.   Type 2 diabetes mellitus with other specified complication ([place])- (present on admission) Hemoglobin A1c 8.9 on 04/23/2021, poorly controlled.  Home regimen includes Lantus 12 units subcutaneously daily, glipizide 2.5 mg p.o. daily.   Bilateral foot pain- (present on admission) Left foot x-ray with no fracture/dislocation, plantar spur noted calcaneus with no focal lytic lesions.  Uric acid level 4.6, within normal limits. --Continue home gabapentin --Norco 5-325mg  PO q6h PRN moderate pain   Hyperlipidemia- (present on admission) Lipid panel with total cholesterol 107, HDL 23, LDL 40, triglycerides of 222, VLDL 44 --started atorvastatin 10 mg PO nightly  Hemorrhoids- (present on admission) --GI following, appreciate assistance --Currently deferring colonoscopy at this time; likely outpatient given cardiology not planning on heart catheterization now inpatient. --Continue Anusol   Hyperkalemia- (present on admission) Potassium 6.2 this morning, plan HD today. --Receiving [facility] --Further per nephrology --Monitor on telemetry. --Repeat BMP in a.m.  Morbid obesity with BMI of 60.0-69.9, adult (HCC) Body mass index is 60.83 kg/m.  Discussed with patient needs for aggressive lifestyle changes/weight loss as this complicates all facets of care. Outpatient follow-up with PCP.  May benefit from bariatric evaluation outpatient.         Subjective:  Patient seen examined bedside, resting comfortably.  Currently in HD.  Currently chest pain-free.  Patient reports, "I want to make sure I am not having any diarrhea before performing heart catheterization as I will be immobile on the table".  Seen by cardiology, likely plan to defer heart catheterization to outpatient.  No other specific complaints or concerns at this  time.  Denies headache, no dizziness, no chest pain, palpitations, no shortness of breath, no abdominal pain, no weakness, no fatigue, no paresthesias.  No acute events overnight per nursing staff.  Physical Exam: Vitals:   05/22/21 1000 05/22/21 1030 05/22/21 1052 05/22/21 1100  BP: (!) 186/69 (!) 145/67 (!) 146/106 90/68  Pulse: 71 96 76 80  Resp: 18 15 15 20   Temp:   97.7 F (36.5 C) 97.7 F (36.5 C)  TempSrc:      SpO2: 100% (!) 88% 100% 100%  Weight:    (!) 165.8 kg  Height:       Physical Exam GEN: 51 yo female in NAD, alert and oriented x 3, obese HEENT: NCAT, PERRL, EOMI, sclera clear, MMM PULM: CTAB w/o wheezes/crackles, normal respiratory effort, on 2 L nasal cannula with SPO2 100% at rest CV: RRR w/o M/G/R GI: abd soft, NTND, NABS, no R/G/M MSK: no peripheral edema, muscle strength globally intact 5/5 bilateral upper/lower extremities NEURO: CN II-XII intact, no focal deficits, sensation to light touch intact PSYCH: normal mood/affect Integumentary: dry/intact, no rashes or wounds   Data Reviewed:  CBC, BMP reviewed personally this morning.  Family Communication: No family present at bedside this morning  Disposition: Status is: Inpatient Remains inpatient appropriate because: Need to finalize recommendations from cardiology and gastroenterology prior to readiness for discharge, if no planned procedures inpatient, anticipate discharge home tomorrow.          Planned Discharge Destination: Home     Time spent: 49 minutes spent on chart review, discussion with nursing staff, consultants, updating family and interview/physical exam; more than 50% of that time was spent in counseling and/or coordination of care.  Author: Donnamarie Poag [place] DO 05/22/2021 1:42 PM  For on call review [email or web address].

## 2021-05-22 NOTE — Progress Notes (Addendum)
Progress Note  Patient Name: Courtney Gray Date of Encounter: 05/22/2021  Primary Cardiologist: None   Subjective   Patient is seen in HD.  She appears comfortable in no acute respiratory distress.  She did not have any additional bowel meant after yesterday morning.  Said that her anal spasm and pain has resolved.  Still worried about having diarrhea.  She would like to watch her bowel meant today and decide if she will get a heart cath tomorrow.  Inpatient Medications    Scheduled Meds:  aspirin  81 mg Oral Daily   atorvastatin  10 mg Oral QHS   calcitRIOL  1 mcg Oral Q T,Th,Sa-HD   calcium carbonate  1,500 mg Oral TID WC   Chlorhexidine Gluconate Cloth  6 each Topical Daily   gabapentin  100 mg Oral Once per day on Tue Thu Sat   gabapentin  100 mg Oral 2 times per day on Sun Mon Wed Fri   hydrocortisone  25 mg Rectal BID   insulin aspart  0-5 Units Subcutaneous QHS   insulin aspart  0-6 Units Subcutaneous TID WC   linaclotide  145 mcg Oral QAC breakfast   midodrine  5 mg Oral BID WC   pantoprazole  40 mg Oral Daily   sevelamer carbonate  800 mg Oral TID WC   sodium chloride flush  3 mL Intravenous Q12H   sodium chloride flush  3 mL Intravenous Q12H   sodium zirconium cyclosilicate  10 g Oral Once   tiZANidine  4 mg Oral QHS   Continuous Infusions:  sodium chloride     sodium chloride     sodium chloride 10 mL/hr at 05/21/21 0624   [START ON 05/23/2021] ferric gluconate (FERRLECIT) IVPB     sodium chloride     PRN Meds: sodium chloride, acetaminophen, calcium carbonate, HYDROcodone-acetaminophen, nitroGLYCERIN, ondansetron **OR** ondansetron (ZOFRAN) IV, simethicone, sodium chloride flush, witch hazel-glycerin   Vital Signs    Vitals:   05/21/21 0002 05/21/21 0453 05/21/21 1945 05/22/21 0745  BP: (!) 157/55 (!) 140/57 (!) 148/85 (!) 198/62  Pulse: 76 69 68 67  Resp:   18 19  Temp:  97.6 F (36.4 C) 98.3 F (36.8 C) 98.5 F (36.9 C)  TempSrc:  Oral Oral    SpO2: 100% 97% 100% 100%  Weight:      Height:        Intake/Output Summary (Last 24 hours) at 05/22/2021 0810 Last data filed at 05/22/2021 0300 Gross per 24 hour  Intake 578.58 ml  Output --  Net 578.58 ml   Filed Weights   05/17/21 2134 05/18/21 1415 05/18/21 2100  Weight: (!) 168.3 kg (!) 170.5 kg (!) 166.4 kg    Telemetry    ECG   2/14 - NSR, mild ST depression of V5-6 - Personally Reviewed  Physical Exam   GEN: No acute distress.   Neck: Could not assess due to body habitus Cardiac: RRR, no murmurs, rubs, or gallops.  Respiratory: Normal work of breathing GI: Soft, nontender, non-distended  MS: Trace edema; No deformity. Neuro:  Nonfocal  Psych: Normal affect   Labs    Chemistry Recent Labs  Lab 05/20/21 0618 05/21/21 0638 05/22/21 0606  NA 134* 132* 134*  K 4.9 5.3* 6.2*  CL 96* 94* 96*  CO2 24 22 23   GLUCOSE 287* 239* 206*  BUN 58* 72* 84*  CREATININE 13.96* 16.61* 18.74*  CALCIUM 8.7* 9.1 8.8*  PROT 7.3 7.8 7.5  ALBUMIN 3.1* 3.4*  3.2*  AST 15 15 13*  ALT 13 14 13   ALKPHOS 57 58 55  BILITOT 0.5 0.4 0.5  GFRNONAA 3* 2* 2*  ANIONGAP 14 16* 15     Hematology Recent Labs  Lab 05/20/21 0618 05/21/21 0638 05/22/21 0606  WBC 9.5 11.7* 11.6*  RBC 2.57* 2.70* 2.54*  HGB 7.7* 7.9* 7.4*  HCT 25.8* 26.7* 25.5*  MCV 100.4* 98.9 100.4*  MCH 30.0 29.3 29.1  MCHC 29.8* 29.6* 29.0*  RDW 15.9* 15.6* 15.6*  PLT 167 183 179    Cardiac EnzymesNo results for input(s): TROPONINI in the last 168 hours. No results for input(s): TROPIPOC in the last 168 hours.   BNPNo results for input(s): BNP, PROBNP in the last 168 hours.   DDimer No results for input(s): DDIMER in the last 168 hours.   Radiology    No results found.  Cardiac Studies   Result Date: 05/17/2021    ECHOCARDIOGRAM REPORT   Patient Name:   EVEE LISKA Date of Exam: 05/17/2021 Medical Rec #:  [id number]       Height:       65.0 in Accession #:    [id number]      Weight:        375.2 lb Date of Birth:  06/11/1970        BSA:          2.585 m Patient Age:    52 years        BP:           127/83 mmHg Patient Gender: F               HR:           70 bpm. Exam Location:  Inpatient Procedure: 2D Echo, Cardiac Doppler, Color Doppler and Intracardiac            Opacification Agent Indications:    NSTEMI  History:        Patient has prior history of Echocardiogram examinations, most                 recent 04/23/2021. Risk Factors:Hypertension, Diabetes and                 Dyslipidemia. ESRD.  Sonographer:    Clayton Lefort RDCS (AE) Referring Phys: [id number] Leanne Chang [facility]  Sonographer Comments: Patient is morbidly obese. Image acquisition challenging due to patient body habitus. IMPRESSIONS  1. Left ventricular ejection fraction, by estimation, is 55 to 60%. The left ventricle has normal function. The left ventricle has no regional wall motion abnormalities. There is moderate concentric left ventricular hypertrophy. Left ventricular diastolic parameters are consistent with Grade II diastolic dysfunction (pseudonormalization). Elevated left atrial pressure.  2. Right ventricular systolic function is normal. The right ventricular size is normal. Tricuspid regurgitation signal is inadequate for assessing PA pressure.  3. Left atrial size was mildly dilated.  4. The mitral valve is degenerative. Mild mitral valve regurgitation. No evidence of mitral stenosis. The mean mitral valve gradient is 3.0 mmHg with average heart rate of 68 bpm. Moderate to severe mitral annular calcification.  5. The aortic valve is tricuspid. There is moderate calcification of the aortic valve. There is moderate thickening of the aortic valve. Aortic valve regurgitation is not visualized. Mild aortic valve stenosis. Aortic valve mean gradient measures 13.0 mmHg. Aortic valve Vmax measures 2.45 m/s. Comparison(s): Prior images reviewed side by side. There is now mild aortic stenosis.   Patient Profile  51 year old female  with past medical history of ESRD on HD, hypertension, hyperlipidemia, type 2 diabetes, who was admitted for rectal bleeding.  Currently being evaluated for NSTEMI.   Assessment & Plan    NSTEMI  Patient needs a LHC to rule out severe CAD causing her chest pain. She is not a candidate for other methods such as CTA due to body habitus. Will not perform any intervention until GI bleed stabilizes. If there is no significant obstruction, she may proceed with a colonoscopy.  -LHC canceled yesterday due to GI issue.  Patient still not comfortable getting it today concerning for diarrhea. -Will resume Coreg today. Monitor HR.  -Unclear benefit of ACE/ARB in ESRD.  -Will not resume Imdur given she did not report symptom of persistent chest pain.  -Continue aspirin.    Hematochezia Hemoglobin 7.4 down from 7.9. No further BM since yesterday.  -Trend hemoglobin -Continue bowel regimen to prevent straining   ESRD on HD (TTS) Patient did not get HD yesterday. HD this morning.  Does not appear volume overloaded on exam.    Morbid obesity She will need weight loss counseling after discharge   Hyperlipidemia LDL 40 -Continue atorvastatin 10 mg   Type 2 diabetes -Per primary team  For questions or updates, please contact [place] Please consult [email or web address] for contact info under Cardiology/STEMI.      Signed, Gaylan Gerold, DO  05/22/2021, 8:10 AM      In the absence of active angina, now > 72 hours from presentation, with normal ECG and absence of wall motion abnormalities on echocardiography, coronary angiography is no longer urgent. I do believe she would benefit from cardiac cath and angiography for further diagnosis and risk stratification, as there is no appropriate noninvasive study that would answer those questions.  We have scheduled the cardiac cath several times, on consecutive days, and the patient has canceled the procedure due to GI issues. The active GI issues also make  it undesirable to perform any revascularization procedure until the bleeding has subsided.  The cardiac catheterization can be scheduled as an outpatient, when the patient feels she is ready.  Meanwhile continue ASA, statin and beta blocker therapy. OK to provide with SL NTG as a prn medication (although she has not had any angina in the last 3 days). She should promptly return to hospital for chest discomfort not relieved within 25 minutes.  Sanda Klein, MD, [facility] [phone] office [phone] pager

## 2021-05-22 NOTE — Progress Notes (Addendum)
Attending physician's note   I have taken a history, reviewed the chart, and examined the patient. I performed a substantive portion of this encounter, including complete performance of at least one of the key components, in conjunction with the APP. I agree with the APP's note, impression, and recommendations with my edits.   Patient declined dialysis yesterday and declined cardiac catheterization yesterday and again today citing concerns about having diarrhea during procedure.  Was given dose of Linzess yesterday with subsequent diarrhea.  No hematochezia yesterday or today.  No stools today.  1) Chronic constipation - Suspected IBS-C - Had a long conversation with the patient today regarding acute and chronic treatment of chronic constipation.  Seems like the Linzess was actually efficacious as she had diarrhea.  I explained that this can happen not infrequently in up to a week after starting Linzess.  She would ultimately like to be on Linzess or similar medication as an outpatient, but not now as an inpatient - Continue adequate fluid intake - Continue MiraLAX 1 cap/day for soft stools until ready to add Linzess back to her regimen - Again, if starting Linzess or similar medication as an outpatient, can start taking on a Saturday so to minimize disruption to hemodialysis as much as possible  2) Hematochezia 3) Hemorrhoids - Exacerbation of hemorrhoids related to underlying constipation.  No bleeding the last 2 days - Continue Anusol suppositories - Continue treatment of underlying constipation as above - As previously discussed, no plan for colonoscopy right now given uncertain cardiac disease.  If cardiac catheterization shows nonobstructive disease, would continue out medical management alone for her GI issues.  If obstructive disease that requires revascularization, plan would be for colonoscopy to rule out high risk lesions that  could then allow for cardiac revascularization with antiplatelet therapy/anticoagulation as needed.  Nonetheless, as she is currently without hematochezia and is now declining cardiac catheterization, no plan to move forward with colonoscopy at this juncture  4) NSTEMI - As above, patient declined cardiac catheterization multiple times.  Per Cardiology notes, this will be now scheduled as an outpatient when patient is ready  5) ESRD - HD per Nephrology  6) Diabetes 7) Chronic Nausea - Discussed relationship of diabetes and some of her GI complaints, to include risk of gastroparesis, decreased bowel transit, etc. - Can consider outpatient gastric emptying study  GI service will remain available as needed  Gerrit Heck, DO, [place] [phone] office                Daily Rounding Note  05/22/2021, 11:04 AM  LOS: 6 days   SUBJECTIVE:   Chief complaint:  bleeding PR, abd pain, anxiety, NSTEMI   Laxatives on Sat Sunday, bloody BM on Monday.  Linzess yesterday AM, subsequent non-bloody diarhea.     Tolerating solid food.  No cath yet.    OBJECTIVE:         Vital signs in last 24 hours:    Temp:  [98.3 F (36.8 C)-98.5 F (36.9 C)] 98.5 F (36.9 C) (02/15 0758) Pulse Rate:  [65-96] 96 (02/15 1030) Resp:  [11-20] 15 (02/15 1030) BP: (145-198)/(57-85) 145/67 (02/15 1030) SpO2:  [88 %-100 %] 88 % (02/15 1030) Weight:  [169.5 kg] 169.5 kg (02/15 0745) Last BM Date : 05/21/21 Filed Weights   05/18/21 1415 05/18/21 2100 05/22/21 0745  Weight: (!) 170.5 kg (!) 166.4 kg (!) 169.5 kg   General: obese, anxious   Heart: RRR Chest: no dyspnea or dough  Abdomen: soft, obese.  Mild general abd tenderness, no G/R.  BS active  Extremities: edema Neuro/Psych:  oriented x 3.  Fluid speech.  Anxious.    Intake/Output from previous day: 02/14 0701 - 02/15 0700 In: 578.6 [P.O.:480; I.V.:98.6] Out: -   Intake/Output this shift: No intake/output data recorded.  Lab Results: Recent  Labs    05/20/21 0618 05/21/21 0638 05/22/21 0606  WBC 9.5 11.7* 11.6*  HGB 7.7* 7.9* 7.4*  HCT 25.8* 26.7* 25.5*  PLT 167 183 179   BMET Recent Labs    05/20/21 0618 05/21/21 0638 05/22/21 0606  NA 134* 132* 134*  K 4.9 5.3* 6.2*  CL 96* 94* 96*  CO2 24 22 23   GLUCOSE 287* 239* 206*  BUN 58* 72* 84*  CREATININE 13.96* 16.61* 18.74*  CALCIUM 8.7* 9.1 8.8*   LFT Recent Labs    05/20/21 0618 05/21/21 0638 05/22/21 0606  PROT 7.3 7.8 7.5  ALBUMIN 3.1* 3.4* 3.2*  AST 15 15 13*  ALT 13 14 13   ALKPHOS 57 58 55  BILITOT 0.5 0.4 0.5   PT/INR No results for input(s): LABPROT, INR in the last 72 hours. Hepatitis Panel No results for input(s): HEPBSAG, HCVAB, HEPAIGM, HEPBIGM in the last 72 hours.  Studies/Results: No results found.  Scheduled Meds:  aspirin  81 mg Oral Daily   atorvastatin  10 mg Oral QHS   calcitRIOL  1 mcg Oral Q T,Th,Sa-HD   calcium carbonate  1,500 mg Oral TID WC   carvedilol  25 mg Oral BID   Chlorhexidine Gluconate Cloth  6 each Topical Daily   gabapentin  100 mg Oral Once per day on Tue Thu Sat   gabapentin  100 mg Oral 2 times per day on Sun Mon Wed Fri   hydrocortisone  25 mg Rectal BID   insulin aspart  0-5 Units Subcutaneous QHS   insulin aspart  0-6 Units Subcutaneous TID WC   linaclotide  145 mcg Oral QAC breakfast   midodrine  5 mg Oral BID WC   pantoprazole  40 mg Oral Daily   sevelamer carbonate  800 mg Oral TID WC   sodium chloride flush  3 mL Intravenous Q12H   sodium chloride flush  3 mL Intravenous Q12H   sodium zirconium cyclosilicate  10 g Oral Once   tiZANidine  4 mg Oral QHS   Continuous Infusions:  sodium chloride     sodium chloride     sodium chloride 10 mL/hr at 05/21/21 0624   [START ON 05/23/2021] ferric gluconate (FERRLECIT) IVPB     sodium chloride     PRN Meds:.sodium chloride, acetaminophen, calcium carbonate, HYDROcodone-acetaminophen, nitroGLYCERIN, ondansetron **OR** ondansetron (ZOFRAN) IV,  simethicone, sodium chloride flush, witch hazel-glycerin   ASSESMENT:     Bleeding PR in pt w chronic constipation, hemorrhoids.  High risk for colonoscopy/sedation due to obesity,  chronic oxygen, NSTEMI.  Linzess added 2/13.    Chronic anemia.  Macrocytic indices.  Iron, TIBC low.  Elevated ferritin. B12 and folate okay.  Q. Thursday ferrlecit in place.  No PRBCs to date.      Chronic, frequent nausea, GERD sxs.  Told she has gastroparesis but never had formal testing for this.  Recent increase from q day to BID PPI at home.  Now on Protonix 40 mg po daily.      NSTEMI.  Pt declined cardiac cath due to diarrhea issues.  Not clear when it will happen.      ESRD.  Recently refused dialysis but under went dialysis today.  Anxiety, this is a significant issue and colors patient's medical decision making.  Not currently on any behavioral health meds.    PLAN   If, when she undergoes cardiac cath, she has significant coronary disease requiring intervention, GI will proceed with colonoscopy.  If nonobstructive CAD, plan to treat constipation and hemorrhoids as doing and not pursue colonoscopy while inpt but consider this in outpt setting.    Stopping Liinzess for now, will strategize constipation mgt w Dr C, orders to follow.      Azucena Freed  05/22/2021, 11:04 AM Phone [phone]

## 2021-05-23 DIAGNOSIS — I214 Non-ST elevation (NSTEMI) myocardial infarction: Secondary | ICD-10-CM | POA: Diagnosis not present

## 2021-05-23 LAB — BASIC METABOLIC PANEL
Anion gap: 16 — ABNORMAL HIGH (ref 5–15)
BUN: 67 mg/dL — ABNORMAL HIGH (ref 6–20)
CO2: 23 mmol/L (ref 22–32)
Calcium: 9.2 mg/dL (ref 8.9–10.3)
Chloride: 95 mmol/L — ABNORMAL LOW (ref 98–111)
Creatinine, Ser: 15.77 mg/dL — ABNORMAL HIGH (ref 0.44–1.00)
GFR, Estimated: 2 mL/min — ABNORMAL LOW (ref >60)
Glucose, Bld: 232 mg/dL — ABNORMAL HIGH (ref 70–99)
Potassium: 5.4 mmol/L — ABNORMAL HIGH (ref 3.5–5.1)
Sodium: 134 mmol/L — ABNORMAL LOW (ref 135–145)

## 2021-05-23 LAB — CBC
HCT: 26.1 % — ABNORMAL LOW (ref 36.0–46.0)
Hemoglobin: 7.7 g/dL — ABNORMAL LOW (ref 12.0–15.0)
MCH: 29.7 pg (ref 26.0–34.0)
MCHC: 29.5 g/dL — ABNORMAL LOW (ref 30.0–36.0)
MCV: 100.8 fL — ABNORMAL HIGH (ref 80.0–100.0)
Platelets: 178 10*3/uL (ref 150–400)
RBC: 2.59 MIL/uL — ABNORMAL LOW (ref 3.87–5.11)
RDW: 15.7 % — ABNORMAL HIGH (ref 11.5–15.5)
WBC: 10.5 10*3/uL (ref 4.0–10.5)
nRBC: 0 % (ref 0.0–0.2)

## 2021-05-23 LAB — GLUCOSE, CAPILLARY
Glucose-Capillary: 208 mg/dL — ABNORMAL HIGH (ref 70–99)
Glucose-Capillary: 164 mg/dL — ABNORMAL HIGH (ref 70–99)
Glucose-Capillary: 138 mg/dL — ABNORMAL HIGH (ref 70–99)
Glucose-Capillary: 150 mg/dL — ABNORMAL HIGH (ref 70–99)

## 2021-05-23 LAB — HEPATITIS B SURFACE ANTIGEN: Hepatitis B Surface Ag: NONREACTIVE

## 2021-05-23 LAB — HEPATITIS B SURFACE ANTIBODY,QUALITATIVE: Hep B S Ab: NONREACTIVE

## 2021-05-23 LAB — HEPATITIS B CORE ANTIBODY, TOTAL: Hep B Core Total Ab: NONREACTIVE

## 2021-05-23 MED ORDER — GABAPENTIN 100 MG PO CAPS
100.0000 mg | ORAL_CAPSULE | Freq: Once | ORAL | Status: AC
  Administered 2021-05-23: 100 mg via ORAL
  Filled 2021-05-23: qty 1

## 2021-05-23 MED ORDER — HEPARIN SODIUM (PORCINE) 1000 UNIT/ML IJ SOLN
INTRAMUSCULAR | Status: AC
  Administered 2021-05-23: 3200 [IU]
  Filled 2021-05-23: qty 3

## 2021-05-23 MED ORDER — GABAPENTIN 100 MG PO CAPS: 100.0000 mg | ORAL_CAPSULE | ORAL | Status: DC

## 2021-05-23 MED ORDER — GABAPENTIN 100 MG PO CAPS: 100.0000 mg | ORAL_CAPSULE | Freq: Two times a day (BID) | ORAL | Status: DC

## 2021-05-23 MED ORDER — GABAPENTIN 100 MG PO CAPS
100.0000 mg | ORAL_CAPSULE | Freq: Two times a day (BID) | ORAL | Status: DC
  Administered 2021-05-24: 100 mg via ORAL
  Filled 2021-05-23: qty 1

## 2021-05-23 MED ORDER — DOCUSATE SODIUM 100 MG PO CAPS
100.0000 mg | ORAL_CAPSULE | Freq: Every day | ORAL | Status: DC | PRN
  Administered 2021-05-23: 100 mg via ORAL
  Filled 2021-05-23: qty 1

## 2021-05-23 NOTE — Evaluation (Signed)
Occupational Therapy Evaluation Patient Details Name: Courtney Gray MRN: [id number] DOB: Sep 07, 1970 Today's Date: 05/23/2021   History of Present Illness 51 yo admitted 2/9 with chest pain and rectal bleed. Pt with NSTEMI. PMhx: ESRD on HD TTS, DM, HTN, HLD, IBS, neuropathy, obesity   Clinical Impression   PTA pt lives at home with her daughter who assists with ADL and IADL tasks as needed. At baseline, pt uses 3LO2 and ambulates with a cane. Recently experienced a foot injury and has been using a RW. Pt wants to be more independent with self care and would greatly benefit from education on compensatory techniques in addition to use of AE. Acute OT to follow. Recommend follow up with [facility]. Pt asking about getting a home health aide - CM notified.      Recommendations for follow up therapy are one component of a multi-disciplinary discharge planning process, led by the attending physician.  Recommendations may be updated based on patient status, additional functional criteria and insurance authorization.   Follow Up Recommendations  Home health OT    Assistance Recommended at Discharge Intermittent Supervision/Assistance  Patient can return home with the following A little help with walking and/or transfers;A lot of help with bathing/dressing/bathroom;Assistance with cooking/housework;Assist for transportation;Help with stairs or ramp for entrance    Functional Status Assessment  Patient has had a recent decline in their functional status and demonstrates the ability to make significant improvements in function in a reasonable and predictable amount of time.  Equipment Recommendations  None recommended by OT    Recommendations for Other Services       Precautions / Restrictions Precautions Precautions: Fall Restrictions Weight Bearing Restrictions: No      Mobility Bed Mobility               General bed mobility comments: EOB on arrival    Transfers Overall transfer  level: Modified independent                        Balance Overall balance assessment: Mild deficits observed, not formally tested                                         ADL either performed or assessed with clinical judgement   ADL Overall ADL's : Needs assistance/impaired     Grooming: Modified independent;Standing   Upper Body Bathing: Minimal assistance Upper Body Bathing Details (indicate cue type and reason): ssist to get under skin folds Lower Body Bathing: Moderate assistance;Sit to/from stand   Upper Body Dressing : Set up   Lower Body Dressing: Moderate assistance;Sit to/from stand (pt unable o reach feet)   Toilet Transfer: Supervision/safety;Ambulation   Toileting- Clothing Manipulation and Hygiene: Maximal assistance Toileting - Clothing Manipulation Details (indicate cue type and reason): difficulty with pericare     Functional mobility during ADLs: Standard walker;Supervision/safety General ADL Comments: Would benefit from AE for pericare nad LB ADL; pt unable to get out of her tub - has a showerseat however may benefit form a tub bench     Vision         Perception     Praxis      Pertinent Vitals/Pain Pain Assessment Pain Assessment: Faces Faces Pain Scale: Hurts little more Pain Location: bil feet Pain Descriptors / Indicators: Aching Pain Intervention(s): Limited activity within patient's tolerance     Hand Dominance  Left   Extremity/Trunk Assessment Upper Extremity Assessment Upper Extremity Assessment: Overall WFL for tasks assessed   Lower Extremity Assessment Lower Extremity Assessment: Defer to PT evaluation   Cervical / Trunk Assessment Cervical / Trunk Assessment: Lordotic   Communication Communication Communication: No difficulties   Cognition Arousal/Alertness: Awake/alert Behavior During Therapy: WFL for tasks assessed/performed Overall Cognitive Status: Within Functional Limits for tasks  assessed                                       General Comments  VSS on RA    Exercises     Shoulder Instructions      Home Living Family/patient expects to be discharged to:: Private residence Living Arrangements: Children Available Help at Discharge: Family;Available 24 hours/day Type of Home: Apartment Home Access: Stairs to enter Entrance Stairs-Number of Steps: 1 Entrance Stairs-Rails: None Home Layout: One level     Bathroom Shower/Tub: Tub/shower unit;Curtain   Bathroom Toilet: Standard Bathroom Accessibility: No   Home Equipment: Hand held shower head;Shower seat;Cane - single point;Grab bars - tub/shower;Rollator (4 wheels)          Prior Functioning/Environment Prior Level of Function : Needs assist       Physical Assist : ADLs (physical)     Mobility Comments: walks with rollator ADLs Comments: daughter assists with pericare, bathing, dressing        OT Problem List: Decreased activity tolerance;Decreased knowledge of use of DME or AE;Cardiopulmonary status limiting activity;Obesity;Pain      OT Treatment/Interventions: Self-care/ADL training;Energy conservation;DME and/or AE instruction;Therapeutic activities;Patient/family education    OT Goals(Current goals can be found in the care plan section) Acute Rehab OT Goals Patient Stated Goal: to bemore independent OT Goal Formulation: With patient Time For Goal Achievement: 06/07/21 Potential to Achieve Goals: Good  OT Frequency: Min 3X/week    Co-evaluation              AM-PAC OT "6 Clicks" Daily Activity     Outcome Measure Help from another person eating meals?: None Help from another person taking care of personal grooming?: None Help from another person toileting, which includes using toliet, bedpan, or urinal?: A Lot Help from another person bathing (including washing, rinsing, drying)?: A Lot Help from another person to put on and taking off regular upper body  clothing?: A Little Help from another person to put on and taking off regular lower body clothing?: A Lot 6 Click Score: 17   End of Session Equipment Utilized During Treatment: Rolling walker (2 wheels) Nurse Communication: Mobility status;Other (comment) (DC needs)  Activity Tolerance: Patient tolerated treatment well Patient left: Other (comment) (with PT)  OT Visit Diagnosis: Muscle weakness (generalized) (M62.81);Pain Pain - Right/Left: Right Pain - part of body:  (B feet; R>L)                Time: 5400-8676 OT Time Calculation (min): 15 min Charges:  OT General Charges $OT Visit: 1 Visit OT Evaluation $OT Eval Moderate Complexity: [place], OT/L   Acute OT Clinical Specialist [place] Pager [phone] Office [phone]   [facility] 05/23/2021, 11:24 AM

## 2021-05-23 NOTE — Progress Notes (Signed)
Attempted service recovery on patient and hemodialysis concerns. Department leadership aware. Will follow-up with nephrology leadership.  Dorthey Sawyer, RN  Dialysis Coordinator [phone]

## 2021-05-23 NOTE — Care Management Important Message (Signed)
Important Message  Patient Details  Name: Courtney Gray MRN: [id number] Date of Birth: Jul 09, 1970   Medicare Important Message Given:  Yes     Shelda Altes 05/23/2021, 9:52 AM

## 2021-05-23 NOTE — Progress Notes (Signed)
PROGRESS NOTE    Courtney Gray  WSF:[id number] DOB: 25-Jan-1971 DOA: 05/16/2021 PCP: Medicine, [facility]    Brief Narrative:  Courtney Gray is a 51 year old female with past medical history significant for ESRD on HD TTS, type 2 diabetes mellitus, essential hypertension, hyperlipidemia, neuropathy, IBS, anemia of chronic renal disease, morbid obesity who presented to the ED on 2/9 with reported rectal bleeding, persistent chest pain.  Patient reports 2 episodes of bleeding with no presence of clots, bright red in color.  Denies black stools and denies NSAID abuse.  No history of colonoscopy in the past.  No recent medication changes.  In addition, patient with continued chest pain since recent discharge, midsternal and present both at rest and with exertion; nonradiating with associated shortness of breath.  Denies lower extremity edema.  In the ED, temperature 98.8 F, HR 72, RR 25, SBP 160.  SPO2 100% on room air.  Sodium 130, potassium 5.1, chloride 93, CO2 24, glucose 166, BUN 71, creatinine 14.11, AST 14, ALT 17, total bilirubin 0.7.  WBC 12.7, hemoglobin 8.5, platelets 217.  COVID-19 PCR negative.  Influenza A/B PCR negative.  Chest x-ray with cardiomegaly, no alveolar pulmonary edema or focal consolidation.  EDP performed rectal exam with findings of hemorrhoids.  Cardiology was consulted and recommended transfer to [facility] for further evaluation and GI consultation for GI bleeding.  TRH consulted for further evaluation and management of rectal bleeding and chest pain.    Assessment & Plan:   Assessment and Plan: * NSTEMI (non-ST elevated myocardial infarction) ([place])- (present on admission) Patient presenting with persistent chest pain, constant, nonradiating localized to her anterior chest associated with shortness of breath. hs troponin [id number]>652>625>302>326.  TTE 2/10 with LVEF 55-60%, LV normal function, no regional wall motion abnormalities, moderate concentric  LVH, grade 2 diastolic dysfunction, LA mildly dilated, moderate/severe mitral valve alveolar calcification with mild MV regurgitation, mild aortic valve stenosis.  Cardiology was consulted and followed during hospital course, now signed off with recommendations of outpatient follow-up; with deferment of heart catheterization outpatient. --Carvedilol 25 mg p.o. twice daily on Sun/Mon/Wed/Fri --Continue to monitor on telemetry  Rectal bleeding- (present on admission) Patient reported episodes of rectal bleeding on admission, onset 2 days prior to hospitalization.  Patient with history of IBS, constipation predominant with EDP findings of hemorrhoids on rectal examination.  No previous history of colonoscopy in the past.  See abdomen/pelvis without contrast with no findings to explain localized rectal bleeding.  Seen by  GI; and to defer colonoscopy to outpatient. Outpatient follow-up scheduled with GI, Dr. Candis Schatz on 06/11/2021.  ESRD (end stage renal disease) ([place])- (present on admission) Patient dialyzes on a Tuesday/Thursday/Saturday schedule outpatient. --Nephrology following, appreciate assistance --Renal diet, strict I's and O's, daily weights --HD today; has had issues with her current catheter, may need exchange   HTN (hypertension)- (present on admission) Home regimen includes carvedilol, Imdur, amlodipine, lisinopril. --Continue carvedilol 25 mg p.o. twice daily on Sun/Mon/Wed/Fri --Holding home Imdur, amlodipine, lisinopril for borderline hypotension --Continue aspirin and statin --Continue monitor BP closely  Chronic respiratory failure with hypoxia (HCC)- (present on admission) --Continue home 2 L per nasal cannula   Anemia of chronic renal failure- (present on admission) Anemia panel iron level 25, TIBC 179, saturation ratios of 14%, ferritin level of 833, 4 level 9.2, vitamin B12 642 --Nephrology plans ferric gluconate 62.5 mg IV every Thursday   Type 2 diabetes  mellitus with other specified complication ([place])- (present on admission) Hemoglobin A1c 8.9 on 04/23/2021,  poorly controlled.  Home regimen includes Lantus 12 units subcutaneously daily, glipizide 2.5 mg p.o. daily.   Bilateral foot pain- (present on admission) Left foot x-ray with no fracture/dislocation, plantar spur noted calcaneus with no focal lytic lesions.  Uric acid level 4.6, within normal limits. --Continue home gabapentin --Norco 5-325mg  PO q6h PRN moderate pain   Hyperlipidemia- (present on admission) Lipid panel with total cholesterol 107, HDL 23, LDL 40, triglycerides of 222, VLDL 44 --started atorvastatin 10 mg PO nightly  Hemorrhoids- (present on admission) Gastroenterology now signed off and deferring colonoscopy to outpatient setting.  Continue Anusol.  Outpatient follow-up with GI scheduled on 06/11/2021   Hyperkalemia- (present on admission) Potassium 6.2 this morning, plan HD today. --Receiving [facility] --Further per nephrology --Monitor on telemetry. --Repeat BMP in a.m.  Morbid obesity with BMI of 60.0-69.9, adult (HCC) Body mass index is 60.83 kg/m.  Discussed with patient needs for aggressive lifestyle changes/weight loss as this complicates all facets of care. Outpatient follow-up with PCP.  May benefit from bariatric evaluation outpatient.     DVT prophylaxis: SCDs Start: 05/16/21 2243    Code Status: Full Code Family Communication:   Disposition Plan:  Level of care: Telemetry Status is: Inpatient Remains inpatient appropriate because: Awaiting nephrology sign off, may need HD catheter exchange due to issues with catheter during hemodialysis yesterday, plan HD today.     Consultants:  [facility] -signed off 2/15; deferring colonoscopy outpatient Cardiology -signed off 2/16; deferring heart catheterization outpatient Nephrology  Procedures:  TTE  Antimicrobials:  None   Subjective: Patient seen examined bedside, resting comfortably.   Sitting at edge of bed.  Family present.  Cardiology and GI signed off and deferring colonoscopy/heart catheterization to outpatient setting.  Seen by nephrology this morning, concern with issues with HD catheter and may need exchange; attempting HD today.  Seen by PT/OT with recommendations of home health on discharge.  No other questions or concerns at this time, patient remains chest pain-free.  Patient further denies headache, no dizziness, no fever/chills/night sweats, no palpitations, no nausea/vomiting/diarrhea, no abdominal pain, no weakness, no fatigue, no paresthesias.  No acute events overnight per nursing staff.  Objective: Vitals:   05/22/21 1916 05/23/21 0935 05/23/21 1025 05/23/21 1127  BP: (!) 115/47   (!) 111/32  Pulse: 71 72 (!) 103 69  Resp: 18   16  Temp: 98.2 F (36.8 C)     TempSrc: Oral     SpO2: 100% 100% 100% 100%  Weight:      Height:       No intake or output data in the 24 hours ending 05/23/21 1231 Filed Weights   05/18/21 2100 05/22/21 0745 05/22/21 1100  Weight: (!) 166.4 kg (!) 169.5 kg (!) 165.8 kg    Examination:  Physical Exam: GEN: NAD, alert and oriented x 3, obese HEENT: NCAT, PERRL, EOMI, sclera clear, MMM PULM: CTAB w/o wheezes/crackles, normal respiratory effort on 2 L nasal cannula with SPO2 96% at rest which is her typical baseline CV: RRR w/o M/G/R GI: abd soft, NTND, NABS, no R/G/M MSK: no peripheral edema, muscle strength globally intact 5/5 bilateral upper/lower extremities NEURO: CN II-XII intact, no focal deficits, sensation to light touch intact PSYCH: normal mood/affect Integumentary: dry/intact, no rashes or wounds    Data Reviewed: I have personally reviewed following labs and imaging studies  CBC: Recent Labs  Lab 05/18/21 0148 05/19/21 0404 05/20/21 0618 05/21/21 3500 05/22/21 0606 05/23/21 0637  WBC 11.4* 11.3* 9.5 11.7* 11.6* 10.5  NEUTROABS 8.9* 8.5* 6.8 8.7* 8.7*  --   HGB 7.9* 8.3* 7.7* 7.9* 7.4* 7.7*  HCT  26.1* 28.1* 25.8* 26.7* 25.5* 26.1*  MCV 99.6 98.9 100.4* 98.9 100.4* 100.8*  PLT 187 176 167 183 179 299   Basic Metabolic Panel: Recent Labs  Lab 05/18/21 0148 05/19/21 0404 05/20/21 0618 05/21/21 0638 05/22/21 0606 05/23/21 0637  NA 134* 134* 134* 132* 134* 134*  K 4.7 4.7 4.9 5.3* 6.2* 5.4*  CL 97* 95* 96* 94* 96* 95*  CO2 25 26 24 22 23 23   GLUCOSE 236* 187* 287* 239* 206* 232*  BUN 47* 39* 58* 72* 84* 67*  CREATININE 11.57* 10.92* 13.96* 16.61* 18.74* 15.77*  CALCIUM 8.6* 9.6 8.7* 9.1 8.8* 9.2  MG 2.3 2.2 2.2 2.4 2.5*  --   PHOS 6.2* 6.8* 8.3* 9.1* 10.6*  --    GFR: Estimated Creatinine Clearance: 6.7 mL/min (A) (by C-G formula based on SCr of 15.77 mg/dL (H)). Liver Function Tests: Recent Labs  Lab 05/18/21 0148 05/19/21 0404 05/20/21 0618 05/21/21 3716 05/22/21 0606  AST 14* 14* 15 15 13*  ALT 14 14 13 14 13   ALKPHOS 59 64 57 58 55  BILITOT 0.3 0.4 0.5 0.4 0.5  PROT 7.0 7.9 7.3 7.8 7.5  ALBUMIN 3.1* 3.3* 3.1* 3.4* 3.2*   No results for input(s): LIPASE, AMYLASE in the last 168 hours. No results for input(s): AMMONIA in the last 168 hours. Coagulation Profile: No results for input(s): INR, PROTIME in the last 168 hours. Cardiac Enzymes: No results for input(s): CKTOTAL, CKMB, CKMBINDEX, TROPONINI in the last 168 hours. BNP (last 3 results) No results for input(s): PROBNP in the last 8760 hours. HbA1C: No results for input(s): HGBA1C in the last 72 hours. CBG: Recent Labs  Lab 05/22/21 1216 05/22/21 1629 05/22/21 2245 05/23/21 0743 05/23/21 1142  GLUCAP 130* 163* 140* 208* 164*   Lipid Profile: No results for input(s): CHOL, HDL, LDLCALC, TRIG, CHOLHDL, LDLDIRECT in the last 72 hours. Thyroid Function Tests: No results for input(s): TSH, T4TOTAL, FREET4, T3FREE, THYROIDAB in the last 72 hours. Anemia Panel: No results for input(s): VITAMINB12, FOLATE, FERRITIN, TIBC, IRON, RETICCTPCT in the last 72 hours. Sepsis Labs: No results for input(s):  PROCALCITON, LATICACIDVEN in the last 168 hours.  Recent Results (from the past 240 hour(s))  Resp Panel by RT-PCR (Flu A&B, Covid) Nasopharyngeal Swab     Status: None   Collection Time: 05/16/21 12:35 PM   Specimen: Nasopharyngeal Swab; Nasopharyngeal(NP) swabs in vial transport medium  Result Value Ref Range Status   SARS Coronavirus 2 by RT PCR NEGATIVE NEGATIVE Final    Comment: (NOTE) SARS-CoV-2 target nucleic acids are NOT DETECTED.  The SARS-CoV-2 RNA is generally detectable in upper respiratory specimens during the acute phase of infection. The lowest concentration of SARS-CoV-2 viral copies this assay can detect is 138 copies/mL. A negative result does not preclude SARS-Cov-2 infection and should not be used as the sole basis for treatment or other patient management decisions. A negative result may occur with  improper specimen collection/handling, submission of specimen other than nasopharyngeal swab, presence of viral mutation(s) within the areas targeted by this assay, and inadequate number of viral copies(<138 copies/mL). A negative result must be combined with clinical observations, patient history, and epidemiological information. The expected result is Negative.  Fact Sheet for Patients:  EntrepreneurPulse.com.au  Fact Sheet for Healthcare Providers:  IncredibleEmployment.be  This test is no t yet approved or cleared by the [place] FDA  and  has been authorized for detection and/or diagnosis of SARS-CoV-2 by FDA under an Emergency Use Authorization (EUA). This EUA will remain  in effect (meaning this test can be used) for the duration of the COVID-19 declaration under Section 564(b)(1) of the Act, 21 U.S.C.section 360bbb-3(b)(1), unless the authorization is terminated  or revoked sooner.       Influenza A by PCR NEGATIVE NEGATIVE Final   Influenza B by PCR NEGATIVE NEGATIVE Final    Comment: (NOTE) The Xpert Xpress  SARS-CoV-2/FLU/RSV plus assay is intended as an aid in the diagnosis of influenza from Nasopharyngeal swab specimens and should not be used as a sole basis for treatment. Nasal washings and aspirates are unacceptable for Xpert Xpress SARS-CoV-2/FLU/RSV testing.  Fact Sheet for Patients: EntrepreneurPulse.com.au  Fact Sheet for Healthcare Providers: IncredibleEmployment.be  This test is not yet approved or cleared by the [place] FDA and has been authorized for detection and/or diagnosis of SARS-CoV-2 by FDA under an Emergency Use Authorization (EUA). This EUA will remain in effect (meaning this test can be used) for the duration of the COVID-19 declaration under Section 564(b)(1) of the Act, 21 U.S.C. section 360bbb-3(b)(1), unless the authorization is terminated or revoked.  Performed at [facility], [address], [place], [place] [address]          Radiology Studies: No results found.      Scheduled Meds:  aspirin  81 mg Oral Daily   atorvastatin  10 mg Oral QHS   calcitRIOL  1 mcg Oral Q T,Th,Sa-HD   calcium carbonate  1,500 mg Oral TID WC   [START ON 05/24/2021] carvedilol  25 mg Oral 2 times per day on Sun Mon Wed Fri   Chlorhexidine Gluconate Cloth  6 each Topical Daily   gabapentin  100 mg Oral Once per day on Tue Thu Sat   gabapentin  100 mg Oral 2 times per day on Sun Mon Wed Fri   hydrocortisone  25 mg Rectal BID   insulin aspart  0-5 Units Subcutaneous QHS   insulin aspart  0-6 Units Subcutaneous TID WC   pantoprazole  40 mg Oral Daily   sevelamer carbonate  800 mg Oral TID WC   sodium chloride flush  3 mL Intravenous Q12H   sodium chloride flush  3 mL Intravenous Q12H   tiZANidine  4 mg Oral BID   Continuous Infusions:  sodium chloride     sodium chloride     sodium chloride 10 mL/hr at 05/21/21 8466   ferric gluconate (FERRLECIT) IVPB     sodium chloride       LOS: 7 days    Time spent: 47 minutes  spent on chart review, discussion with nursing staff, consultants, updating family and interview/physical exam; more than 50% of that time was spent in counseling and/or coordination of care.    Emara Lichter J [place] DO [facility] Available via Epic secure chat 7am-7pm After these hours, please refer to coverage provider listed on [email or web address] 05/23/2021, 12:31 PM

## 2021-05-23 NOTE — Progress Notes (Addendum)
Progress Note  Patient Name: Courtney Gray Date of Encounter: 05/23/2021  Primary Cardiologist: None   Subjective   She feels very stressed about all of her medical problems. She feels she does not tolerate 4 hr and 45 minutes dialysis sessions She feels her dry weight is too high Her abdomen is doing okay today No chest pain  Inpatient Medications    Scheduled Meds:  aspirin  81 mg Oral Daily   atorvastatin  10 mg Oral QHS   calcitRIOL  1 mcg Oral Q T,Th,Sa-HD   calcium carbonate  1,500 mg Oral TID WC   [START ON 05/24/2021] carvedilol  25 mg Oral 2 times per day on Sun Mon Wed Fri   Chlorhexidine Gluconate Cloth  6 each Topical Daily   gabapentin  100 mg Oral Once per day on Tue Thu Sat   gabapentin  100 mg Oral 2 times per day on Sun Mon Wed Fri   hydrocortisone  25 mg Rectal BID   insulin aspart  0-5 Units Subcutaneous QHS   insulin aspart  0-6 Units Subcutaneous TID WC   pantoprazole  40 mg Oral Daily   sevelamer carbonate  800 mg Oral TID WC   sodium chloride flush  3 mL Intravenous Q12H   sodium chloride flush  3 mL Intravenous Q12H   tiZANidine  4 mg Oral BID   Continuous Infusions:  sodium chloride     sodium chloride     sodium chloride 10 mL/hr at 05/21/21 9518   ferric gluconate (FERRLECIT) IVPB     sodium chloride     PRN Meds: sodium chloride, acetaminophen, calcium carbonate, carvedilol, docusate sodium, HYDROcodone-acetaminophen, nitroGLYCERIN, ondansetron **OR** ondansetron (ZOFRAN) IV, simethicone, sodium chloride flush, witch hazel-glycerin   Vital Signs    Vitals:   05/22/21 1052 05/22/21 1100 05/22/21 1632 05/22/21 1916  BP: (!) 146/106 90/68 128/70 (!) 115/47  Pulse: 76 80 71 71  Resp: 15 20 16 18   Temp: 97.7 F (36.5 C) 97.7 F (36.5 C) 98.8 F (37.1 C) 98.2 F (36.8 C)  TempSrc:   Oral Oral  SpO2: 100% 100% 100% 100%  Weight:  (!) 165.8 kg    Height:        Intake/Output Summary (Last 24 hours) at 05/23/2021 0815 Last data  filed at 05/22/2021 1100 Gross per 24 hour  Intake --  Output 3627 ml  Net -3627 ml   Filed Weights   05/18/21 2100 05/22/21 0745 05/22/21 1100  Weight: (!) 166.4 kg (!) 169.5 kg (!) 165.8 kg    Telemetry   Sinus rhythm  ECG   None today Personally Reviewed  Physical Exam   General: Well developed, morbidly obese, female with BMI 60, in no acute distress Head: Eyes PERRLA, Head normocephalic and atraumatic Lungs: Clear bilaterally to auscultation. Heart: HRRR S1 S2, without rub or gallop. No murmur. 4/4 extremity pulses are 2+ & equal. JVD seen on the left at 9 cm, right side has an IV Abdomen: Bowel sounds are present, abdomen soft and non-tender without masses or  hernias noted. Msk: Normal strength and tone for age. Extremities: No clubbing, cyanosis or edema.    Skin:  No rashes or lesions noted. Neuro: Alert and oriented X 3. Psych:  Good affect, responds appropriately   Labs    Chemistry Recent Labs  Lab 05/20/21 0618 05/21/21 0638 05/22/21 0606 05/23/21 0637  NA 134* 132* 134* 134*  K 4.9 5.3* 6.2* 5.4*  CL 96* 94* 96* 95*  CO2 24 22 23 23   GLUCOSE 287* 239* 206* 232*  BUN 58* 72* 84* 67*  CREATININE 13.96* 16.61* 18.74* 15.77*  CALCIUM 8.7* 9.1 8.8* 9.2  PROT 7.3 7.8 7.5  --   ALBUMIN 3.1* 3.4* 3.2*  --   AST 15 15 13*  --   ALT 13 14 13   --   ALKPHOS 57 58 55  --   BILITOT 0.5 0.4 0.5  --   GFRNONAA 3* 2* 2* 2*  ANIONGAP 14 16* 15 16*     Hematology Recent Labs  Lab 05/21/21 0638 05/22/21 0606 05/23/21 0637  WBC 11.7* 11.6* 10.5  RBC 2.70* 2.54* 2.59*  HGB 7.9* 7.4* 7.7*  HCT 26.7* 25.5* 26.1*  MCV 98.9 100.4* 100.8*  MCH 29.3 29.1 29.7  MCHC 29.6* 29.0* 29.5*  RDW 15.6* 15.6* 15.7*  PLT 183 179 178   Lab Results  Component Value Date   CHOL 107 05/18/2021   HDL 23 (L) 05/18/2021   LDLCALC 40 05/18/2021   TRIG 222 (H) 05/18/2021   CHOLHDL 4.7 05/18/2021    Lab Results  Component Value Date   HGBA1C 8.9 (H) 04/23/2021    No results found for: TSH  Iron/TIBC/Ferritin/ %Sat    Component Value Date/Time   IRON 25 (L) 05/18/2021 0148   TIBC 179 (L) 05/18/2021 0148   FERRITIN 833 (H) 05/18/2021 0148   IRONPCTSAT 14 05/18/2021 0148    Cardiac Enzymes  High Sensitivity Troponin:   Recent Labs  Lab 05/17/21 0053 05/18/21 0837 05/18/21 1038 05/20/21 1648 05/21/21 0006  TROPONINIHS 1,843* 652* 625* 302* 326*     BNPNo results for input(s): BNP, PROBNP in the last 168 hours.   DDimer No results for input(s): DDIMER in the last 168 hours.   Radiology    No results found.  Cardiac Studies   Result Date: 05/17/2021    ECHOCARDIOGRAM REPORT   Patient Name:   Courtney Gray Date of Exam: 05/17/2021 Medical Rec #:  [id number]       Height:       65.0 in Accession #:    [id number]      Weight:       375.2 lb Date of Birth:  September 22, 1970        BSA:          2.585 m Patient Age:    84 years        BP:           127/83 mmHg Patient Gender: F               HR:           70 bpm.   IMPRESSIONS  1. Left ventricular ejection fraction, by estimation, is 55 to 60%. The left ventricle has normal function. The left ventricle has no regional wall motion abnormalities. There is moderate concentric left ventricular hypertrophy. Left ventricular diastolic parameters are consistent with Grade II diastolic dysfunction (pseudonormalization). Elevated left atrial pressure.  2. Right ventricular systolic function is normal. The right ventricular size is normal. Tricuspid regurgitation signal is inadequate for assessing PA pressure.  3. Left atrial size was mildly dilated.  4. The mitral valve is degenerative. Mild mitral valve regurgitation. No evidence of mitral stenosis. The mean mitral valve gradient is 3.0 mmHg with average heart rate of 68 bpm. Moderate to severe mitral annular calcification.  5. The aortic valve is tricuspid. There is moderate calcification of the aortic valve. There is moderate thickening of the  aortic valve.  Aortic valve regurgitation is not visualized. Mild aortic valve stenosis. Aortic valve mean gradient measures 13.0 mmHg. Aortic valve Vmax measures 2.45 m/s. Comparison(s): Prior images reviewed side by side. There is now mild aortic stenosis.   Patient Profile     51 year old female with past medical history of ESRD on HD, hypertension, hyperlipidemia, type 2 diabetes, who was admitted for rectal bleeding.  Currently being evaluated for NSTEMI.   Assessment & Plan    NSTEMI  -Has not yet had LHC due to GI bleeding -Per Dr. Victorino December note 02/15, defer cath for now because of her other medical issues and follow-up as an outpatient to schedule it then.  Due to body habitus, not a candidate for any other procedures - Explained this to her patient and a friend/relative in the room.  She was very grateful that she did not have to think about a procedure at this admission.  She is happy to follow-up as an outpatient. -Continue ASA 81 mg, Coreg 25 mg twice daily on nondialysis days -Continue Lipitor 10 mg daily, LDL is 40 and LFTs are okay -SBP 90 at times, no chest pain, hold off on Imdur -Discuss with MD if she should be on DAPT   Hematochezia -No more bowel movement since yesterday - Hemoglobin slightly improved at 7.7 - +Iron deficiency by labs - Per IM   ESRD on HD (TTS) -Lungs are clear, no significant edema, mild JVD on exam - She had HD yesterday and will get it again today -Patient feels her dry weight should be reduced   Morbid obesity -Nutritional counseling   Hyperlipidemia -Continue Lipitor at current dose with LDL 40   Type 2 diabetes -Per IM  For questions or updates, please contact [facility] Please consult [email or web address] for contact info under Cardiology/STEMI.      Signed, Rosaria Ferries, PA-C  05/23/2021, 8:15 AM     I have seen and examined the patient along with Rosaria Ferries, PA-C.  I have reviewed the chart, notes and new data.  I agree with PA/NP's  note.  Key new complaints: no angina, walking in hallway with PT Key examination changes: no overt hypervolemia Key new findings / data: stable Hgb Aspirin 81 mg daily Atorvastatin 10 mg daily Carvedilol 25 mg twice daily on nondialysis days (do not take before hemodialysis, take after hemodialysis if systolic blood pressure over 140 mmHg) PLAN: [facility] will sign off.   Medication Recommendations:   Aspirin 81 mg daily Atorvastatin 10 mg daily Carvedilol 25 mg twice daily on nondialysis days (do not take before hemodialysis, take after hemodialysis if systolic blood pressure over 140 mmHg) Other recommendations (labs, testing, etc): Monitor hemoglobin as outpatient (this can be done at dialysis center) Follow up as an outpatient: Arrange follow-up in 3-4 weeks   Sanda Klein, MD, [place] [phone] 05/23/2021, 10:19 AM

## 2021-05-23 NOTE — Progress Notes (Signed)
[facility] Progress Note   Subjective:   Had issues with HD catheter yesterday, BFR dropped to 200s. Cath flo dwell overnight. Pt reports she does usually require heparin with HD but this has been on hold due to blood in stool. Also requests alcohol not be used to clean around her catheter as it irritates her skin. K+ 5.4. Denies SOB, CP, abdominal pain and nausea.   Objective Vitals:   05/22/21 1052 05/22/21 1100 05/22/21 1632 05/22/21 1916  BP: (!) 146/106 90/68 128/70 (!) 115/47  Pulse: 76 80 71 71  Resp: 15 20 16 18   Temp: 97.7 F (36.5 C) 97.7 F (36.5 C) 98.8 F (37.1 C) 98.2 F (36.8 C)  TempSrc:   Oral Oral  SpO2: 100% 100% 100% 100%  Weight:  (!) 165.8 kg    Height:       Physical Exam General: Obese appearing female, alert and in NAD Heart: RRR, no murmurs, rubs or gallops Lungs: CTA bilaterally without wheezing, rhonchi or rales Abdomen: Soft, non-distended, +BS Extremities: No edema b/l lower extremities  Dialysis Access: [facility] with dry, intact bandage  Additional Objective Labs: Basic Metabolic Panel: Recent Labs  Lab 05/20/21 0618 05/21/21 0638 05/22/21 0606 05/23/21 0637  NA 134* 132* 134* 134*  K 4.9 5.3* 6.2* 5.4*  CL 96* 94* 96* 95*  CO2 24 22 23 23   GLUCOSE 287* 239* 206* 232*  BUN 58* 72* 84* 67*  CREATININE 13.96* 16.61* 18.74* 15.77*  CALCIUM 8.7* 9.1 8.8* 9.2  PHOS 8.3* 9.1* 10.6*  --    Liver Function Tests: Recent Labs  Lab 05/20/21 0618 05/21/21 0638 05/22/21 0606  AST 15 15 13*  ALT 13 14 13   ALKPHOS 57 58 55  BILITOT 0.5 0.4 0.5  PROT 7.3 7.8 7.5  ALBUMIN 3.1* 3.4* 3.2*   No results for input(s): LIPASE, AMYLASE in the last 168 hours. CBC: Recent Labs  Lab 05/19/21 0404 05/20/21 0618 05/21/21 4503 05/22/21 0606 05/23/21 0637  WBC 11.3* 9.5 11.7* 11.6* 10.5  NEUTROABS 8.5* 6.8 8.7* 8.7*  --   HGB 8.3* 7.7* 7.9* 7.4* 7.7*  HCT 28.1* 25.8* 26.7* 25.5* 26.1*  MCV 98.9 100.4* 98.9 100.4* 100.8*  PLT 176  167 183 179 178   Blood Culture    Component Value Date/Time   SDES BLOOD LEFT HAND 09/03/2020 2359   SPECREQUEST  09/03/2020 2359    BOTTLES DRAWN AEROBIC AND ANAEROBIC Blood Culture adequate volume   CULT  09/03/2020 2359    NO GROWTH 5 DAYS Performed at [facility], [address], [place], [place] [address]    REPTSTATUS 09/09/2020 FINAL 09/03/2020 2359    Cardiac Enzymes: No results for input(s): CKTOTAL, CKMB, CKMBINDEX, TROPONINI in the last 168 hours. CBG: Recent Labs  Lab 05/21/21 2054 05/22/21 1216 05/22/21 1629 05/22/21 2245 05/23/21 0743  GLUCAP 196* 130* 163* 140* 208*   Iron Studies: No results for input(s): IRON, TIBC, TRANSFERRIN, FERRITIN in the last 72 hours. @lablastinr3 @ Studies/Results: No results found. Medications:  sodium chloride     sodium chloride     sodium chloride 10 mL/hr at 05/21/21 0624   ferric gluconate (FERRLECIT) IVPB     sodium chloride      aspirin  81 mg Oral Daily   atorvastatin  10 mg Oral QHS   calcitRIOL  1 mcg Oral Q T,Th,Sa-HD   calcium carbonate  1,500 mg Oral TID WC   [START ON 05/24/2021] carvedilol  25 mg Oral 2 times per day  on Sun Mon Wed Fri   Chlorhexidine Gluconate Cloth  6 each Topical Daily   gabapentin  100 mg Oral Once per day on Tue Thu Sat   gabapentin  100 mg Oral 2 times per day on Sun Mon Wed Fri   hydrocortisone  25 mg Rectal BID   insulin aspart  0-5 Units Subcutaneous QHS   insulin aspart  0-6 Units Subcutaneous TID WC   pantoprazole  40 mg Oral Daily   sevelamer carbonate  800 mg Oral TID WC   sodium chloride flush  3 mL Intravenous Q12H   sodium chloride flush  3 mL Intravenous Q12H   tiZANidine  4 mg Oral BID    Dialysis Orders: TTS [name]  4h 26min   400/500  165kg  1K/2.5Ca bath  TDC /(not using AVF) Hep 2600 + 1500 unit /hr  - rocaltrol 1.0 ug tiw  - mircera 200 ug q 2 wks, last 2/03  - venofer 50 mg weekly       CXR 2/09 - borderline edema  Assessment/Plan: NSTEMI - per  cards. ? Possibly planning [facility] inpatient, was cancelled yesterday due to diarrhea.  DOE - due to vol overload, had early edema by CXR and was up 5kg on admission. Now close to EDW, continue UF as tolerated.  HypOtension - imdur dc' and got NS bolus due to low BP in the 80's over the weekend. BP now slightly elevated.  Midodrine held.  Should improve post HD.  May need to add back coreg.  ESRD - on HD TTS.  Had HD yesterday off schedule. TDC issues yesterday, cath flo dwell overnight. Likely due to not receiving heparin but anticoagulation is on hold due to hematochezia.  Will attempt HD again today to ensure [facility] is running well, may need exchange if it continues to have issues.  DM2 - per pmd Anemia ckd, hematochezia - next esa due on 2/17. Cont weekly IV fe. GI following, hgb 7.7 MBD ckd - Ca in range, phos high. Cont vdra, on tums tidac, starting renvela Nutrition - renal diet w/fluid restrictions.   Anice Paganini, PA-C 05/23/2021, 8:29 AM  [facility] Pager: [phone]
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
## 2021-05-23 NOTE — TOC Initial Note (Addendum)
Transition of Care [facility]) - Initial/Assessment Note    Patient Details  Name: Courtney Gray MRN: [id number] Date of Birth: May 26, 1970  Transition of [facility] CM/SW Contact:    Bethena Roys, RN Phone Number: 05/23/2021, 1:16 PM  Clinical Narrative:  Risk for readmission assessment completed. Case Manager discussed home health needs with the patient. Patient currently lives in [place] with her daughter. Patient states she has oxygen via [employer] at 3 Liters. Patient has a portable oxygen tank for travel home. Patient is ESRD-HD TTS. Patient has transportation to HD and additional appointments. Patient had questions regarding in home aide. Case Manager did discuss with both patient and daughter how to navigate with her DSS Clinical Social Worker in [place] to see if she appropriate for hours for an in home aide. Case Manager offered patient choice for home health PT-patient declined OT services. Patient chose [facility]- staffing not available in [facility]. 2nd choice was [facility]- call sent to them- awaiting call back. If [facility] cannot accept the patient Case Manager will call [facility].    1344 05-23-21 [facility] called back and the office can accept the patient. Start of care to begin within 24-48 hours post transition home. No further needs from Case Manager at this time.  Expected Discharge Plan: [place] Barriers to Discharge: No Barriers Identified   Patient Goals and CMS Choice Patient states their goals for this hospitalization and ongoing recovery are:: to return home CMS Medicare.gov Compare Post Acute Care list provided to:: Patient Choice offered to / list presented to : Patient, Adult Children  Expected Discharge Plan and Services Expected Discharge Plan: [place] In-house Referral: NA Discharge Planning Services: CM Consult Post Acute Care Choice: [place] arrangements for the past 2 months: Apartment                  DME Arranged: N/A DME Agency: NA       HH Arranged: PT   Date HH Agency Contacted: 05/23/21 Time HH Agency Contacted: [phone]    Prior Living Arrangements/Services Living arrangements for the past 2 months: Apartment Lives with:: Adult Children Patient language and need for interpreter reviewed:: Yes Do you feel safe going back to the place where you live?: Yes      Need for Family Participation in Patient Care: Yes (Comment) Care giver support system in place?: Yes (comment)   Criminal Activity/Legal Involvement Pertinent to Current Situation/Hospitalization: No - Comment as needed  Activities of Daily Living Home Assistive Devices/Equipment: Walker (specify type) ADL Screening (condition at time of admission) Patient's cognitive ability adequate to safely complete daily activities?: Yes Is the patient deaf or have difficulty hearing?: No Does the patient have difficulty seeing, even when wearing glasses/contacts?: No Does the patient have difficulty concentrating, remembering, or making decisions?: No Patient able to express need for assistance with ADLs?: Yes Does the patient have difficulty dressing or bathing?: Yes Independently performs ADLs?: No Communication: Independent Dressing (OT): Needs assistance Is this a change from baseline?: Change from baseline, expected to last >3 days Grooming: Needs assistance Is this a change from baseline?: Change from baseline, expected to last >3 days Feeding: Independent Bathing: Needs assistance Is this a change from baseline?: Change from baseline, expected to last >3 days Toileting: Needs assistance Is this a change from baseline?: Change from baseline, expected to last >3days In/Out Bed: Needs assistance Is this a change from baseline?: Change from baseline, expected to last >3  days Walks in Home: Needs assistance Is this a change from baseline?: Change from baseline, expected to last >3 days Does the patient have  difficulty walking or climbing stairs?: Yes Weakness of Legs: Both Weakness of Arms/Hands: None  Permission Sought/Granted Permission sought to share information with : Case Manager, Family Supports, Chartered certified accountant granted to share information with : Yes, Verbal Permission Granted              Emotional Assessment Appearance:: Appears stated age Attitude/Demeanor/Rapport: Engaged Affect (typically observed): Appropriate Orientation: : Oriented to Situation, Oriented to  Time, Oriented to Place, Oriented to Self Alcohol / Substance Use: Not Applicable Psych Involvement: No (comment)  Admission diagnosis:  Rectal bleeding [K62.5] NSTEMI (non-ST elevated myocardial infarction) ([place]) [I21.4] Hemorrhoids, unspecified hemorrhoid type [K64.9] Patient Active Problem List   Diagnosis Date Noted   Hyperkalemia 05/21/2021   Hemorrhoids    Hyperlipidemia 05/18/2021   Rectal bleeding 05/17/2021   Bilateral foot pain 05/17/2021   Anemia of chronic renal failure 05/17/2021   Morbid obesity with BMI of 60.0-69.9, adult ([place]) 05/17/2021   NSTEMI (non-ST elevated myocardial infarction) ([place]) 05/16/2021   Chest pain 04/23/2021   Bacteremia    Fever 09/01/2020   HTN (hypertension) 09/01/2020   ESRD (end stage renal disease) ([place]) 09/01/2020   Chronic respiratory failure with hypoxia ([place]) 09/01/2020   Respiratory distress    Hypertensive emergency    Respiratory failure ([place]) 08/28/2020   Acute respiratory failure with hypoxia ([place]) 11/28/2019   Fluid overload 11/28/2019   Hypertensive urgency 11/28/2019   Type 2 diabetes mellitus with other specified complication ([place]) 88/02/314   Anemia of chronic disease 11/28/2019   PCP:  Medicine, [facility]:   [place] [place] [address] Phone: [phone] Fax: [phone]  Readmission Risk Interventions Readmission Risk Prevention  Plan 05/23/2021  Transportation Screening Complete  Medication Review (RN Care Manager) Complete  PCP or Specialist appointment within 3-5 days of discharge Complete  HRI or [place] Complete  SW Recovery Care/Counseling Consult Complete  Palliative Care Screening Not [place] Not Applicable

## 2021-05-24 ENCOUNTER — Inpatient Hospital Stay (HOSPITAL_COMMUNITY): Payer: Medicare Other

## 2021-05-24 DIAGNOSIS — I214 Non-ST elevation (NSTEMI) myocardial infarction: Secondary | ICD-10-CM | POA: Diagnosis not present

## 2021-05-24 HISTORY — PX: IR FLUORO GUIDE CV LINE RIGHT: IMG2283

## 2021-05-24 HISTORY — PX: IR REMOVAL TUN CV CATH W/O FL: IMG2289

## 2021-05-24 HISTORY — PX: IR US GUIDE VASC ACCESS RIGHT: IMG2390

## 2021-05-24 LAB — GLUCOSE, CAPILLARY
Glucose-Capillary: 166 mg/dL — ABNORMAL HIGH (ref 70–99)
Glucose-Capillary: 201 mg/dL — ABNORMAL HIGH (ref 70–99)

## 2021-05-24 LAB — RENAL FUNCTION PANEL
Albumin: 3.1 g/dL — ABNORMAL LOW (ref 3.5–5.0)
Anion gap: 13 (ref 5–15)
BUN: 52 mg/dL — ABNORMAL HIGH (ref 6–20)
CO2: 24 mmol/L (ref 22–32)
Calcium: 9 mg/dL (ref 8.9–10.3)
Chloride: 95 mmol/L — ABNORMAL LOW (ref 98–111)
Creatinine, Ser: 12.75 mg/dL — ABNORMAL HIGH (ref 0.44–1.00)
GFR, Estimated: 3 mL/min — ABNORMAL LOW (ref >60)
Glucose, Bld: 227 mg/dL — ABNORMAL HIGH (ref 70–99)
Phosphorus: 8 mg/dL — ABNORMAL HIGH (ref 2.5–4.6)
Potassium: 4.7 mmol/L (ref 3.5–5.1)
Sodium: 132 mmol/L — ABNORMAL LOW (ref 135–145)

## 2021-05-24 MED ORDER — FENTANYL CITRATE (PF) 100 MCG/2ML IJ SOLN
INTRAMUSCULAR | Status: AC | PRN
  Administered 2021-05-24: 50 ug via INTRAVENOUS
  Administered 2021-05-24 (×2): 25 ug via INTRAVENOUS

## 2021-05-24 MED ORDER — FENTANYL CITRATE (PF) 100 MCG/2ML IJ SOLN
INTRAMUSCULAR | Status: AC
  Filled 2021-05-24: qty 4

## 2021-05-24 MED ORDER — HYDROCORTISONE ACETATE 25 MG RE SUPP: 25.0000 mg | Freq: Two times a day (BID) | RECTAL | 0 refills | Status: AC

## 2021-05-24 MED ORDER — NITROGLYCERIN 0.4 MG SL SUBL: 0.4000 mg | SUBLINGUAL_TABLET | SUBLINGUAL | 3 refills | Status: DC | PRN

## 2021-05-24 MED ORDER — MIDAZOLAM HCL 2 MG/2ML IJ SOLN
INTRAMUSCULAR | Status: AC | PRN
  Administered 2021-05-24: 1 mg via INTRAVENOUS
  Administered 2021-05-24: .5 mg via INTRAVENOUS

## 2021-05-24 MED ORDER — VANCOMYCIN HCL IN DEXTROSE 1-5 GM/200ML-% IV SOLN
INTRAVENOUS | Status: AC
  Administered 2021-05-24: 1000 mg via INTRAVENOUS
  Filled 2021-05-24: qty 200

## 2021-05-24 MED ORDER — LIDOCAINE-EPINEPHRINE 1 %-1:100000 IJ SOLN
INTRAMUSCULAR | Status: AC
  Filled 2021-05-24: qty 1

## 2021-05-24 MED ORDER — MIDAZOLAM HCL 2 MG/2ML IJ SOLN
INTRAMUSCULAR | Status: AC
  Filled 2021-05-24: qty 4

## 2021-05-24 MED ORDER — LIDOCAINE HCL (PF) 1 % IJ SOLN
INTRAMUSCULAR | Status: AC | PRN
  Administered 2021-05-24: 10 mL

## 2021-05-24 MED ORDER — HEPARIN SODIUM (PORCINE) 1000 UNIT/ML IJ SOLN
INTRAMUSCULAR | Status: AC
  Administered 2021-05-24: 2800 [IU]
  Filled 2021-05-24: qty 10

## 2021-05-24 MED ORDER — VANCOMYCIN HCL IN DEXTROSE 1-5 GM/200ML-% IV SOLN
1000.0000 mg | INTRAVENOUS | Status: AC
  Filled 2021-05-24: qty 200

## 2021-05-24 NOTE — Procedures (Signed)
°  Procedure: R IJ tunneled HD CVC placement Palindrome 23  Removal of L tunneled CVC   EBL:   minimal Complications:  none immediate  See full dictation in [employer].  Dillard Cannon MD Main # [phone] Pager  [phone] Mobile [phone]

## 2021-05-24 NOTE — Discharge Summary (Signed)
Physician Discharge Summary  Courtney Gray SVX:[id number] DOB: 11/13/1970 DOA: 05/16/2021  PCP: Medicine, [facility]  Admit date: 05/16/2021 Discharge date: 05/24/2021  Admitted From: Home Disposition: Home  Recommendations for Outpatient Follow-up:  Follow up with PCP in 1-2 weeks Follow-up with cardiology in 3-4 weeks following hospitalization Follow-up with gastroenterology, Dr. Candis Schatz as scheduled on 06/11/2021 Continue Anusol suppositories twice daily to complete 7-day course  Home Health: PT/OT Equipment/Devices: None  Discharge Condition: Stable CODE STATUS: Full code Diet recommendation: Renal diet  History of present illness:  Courtney Gray is a 51 year old female with past medical history significant for ESRD on HD TTS, type 2 diabetes mellitus, essential hypertension, hyperlipidemia, neuropathy, IBS, anemia of chronic renal disease, morbid obesity who presented to the ED on 2/9 with reported rectal bleeding, persistent chest pain.  Patient reports 2 episodes of bleeding with no presence of clots, bright red in color.  Denies black stools and denies NSAID abuse.  No history of colonoscopy in the past.  No recent medication changes.  In addition, patient with continued chest pain since recent discharge, midsternal and present both at rest and with exertion; nonradiating with associated shortness of breath.  Denies lower extremity edema.  In the ED, temperature 98.8 F, HR 72, RR 25, SBP 160.  SPO2 100% on room air.  Sodium 130, potassium 5.1, chloride 93, CO2 24, glucose 166, BUN 71, creatinine 14.11, AST 14, ALT 17, total bilirubin 0.7.  WBC 12.7, hemoglobin 8.5, platelets 217.  COVID-19 PCR negative.  Influenza A/B PCR negative.  Chest x-ray with cardiomegaly, no alveolar pulmonary edema or focal consolidation.  EDP performed rectal exam with findings of hemorrhoids.  Cardiology was consulted and recommended transfer to [facility] for further evaluation and GI  consultation for GI bleeding.  TRH consulted for further evaluation and management of rectal bleeding and chest pain.  Hospital course:  Assessment and Plan: * NSTEMI (non-ST elevated myocardial infarction) ([place])- (present on admission) Patient presenting with persistent chest pain, constant, nonradiating localized to her anterior chest associated with shortness of breath. hs troponin 837>1355>1843>652>625>302>326.  TTE 2/10 with LVEF 55-60%, LV normal function, no regional wall motion abnormalities, moderate concentric LVH, grade 2 diastolic dysfunction, LA mildly dilated, moderate/severe mitral valve alveolar calcification with mild MV regurgitation, mild aortic valve stenosis.  Cardiology was consulted and followed during hospital course, now signed off with recommendations of outpatient follow-up; with deferment of heart catheterization outpatient.  Continue carvedilol 25 mg p.o. twice daily on nondialysis days and may take during HD days following HD if SBP greater than 140 per cardiology.  Outpatient follow-up with cardiology in 3 to 4 weeks.  Continue aspirin and statin.  Rectal bleeding- (present on admission) Patient reported episodes of rectal bleeding on admission, onset 2 days prior to hospitalization.  Patient with history of IBS, constipation predominant with EDP findings of hemorrhoids on rectal examination.  No previous history of colonoscopy in the past.  See abdomen/pelvis without contrast with no findings to explain localized rectal bleeding.  Seen by [facility]; and to defer colonoscopy to outpatient. Outpatient follow-up scheduled with GI, Dr. Candis Schatz on 06/11/2021.  ESRD (end stage renal disease) ([place])- (present on admission) Patient dialyzes on a Tuesday/Thursday/Saturday schedule outpatient.  Patient had HD catheter exchanged on 05/24/2021.  Continue outpatient hemodialysis as scheduled.   HTN (hypertension)- (present on admission) Home regimen includes carvedilol, Imdur,  amlodipine, lisinopril.  Continue carvedilol 12 mg p.o. twice daily on nondialysis days and may take on dialysis days following  HD if SBP greater than 140 per cardiology.  Discontinued home Imdur and lisinopril.  Continue amlodipine as needed.  Continue aspirin and statin.  Outpatient follow-up with cardiology.  Chronic respiratory failure with hypoxia (HCC)- (present on admission) Continue home 2 L per nasal cannula   Anemia of chronic renal failure- (present on admission) Anemia panel iron level 25, TIBC 179, saturation ratios of 14%, ferritin level of 833, 4 level 9.2, vitamin B12 642. Nephrology plans ferric gluconate 62.5 mg IV every Thursday   Type 2 diabetes mellitus with other specified complication ([place])- (present on admission) Hemoglobin A1c 8.9 on 04/23/2021, poorly controlled.  Home regimen includes Lantus 12 units subcutaneously daily, glipizide 2.5 mg p.o. daily.   Bilateral foot pain- (present on admission) Left foot x-ray with no fracture/dislocation, plantar spur noted calcaneus with no focal lytic lesions.  Uric acid level 4.6, within normal limits. Continue home gabapentin.   Hyperlipidemia- (present on admission) Lipid panel with total cholesterol 107, HDL 23, LDL 40, triglycerides of 222, VLDL 44.  Continue atorvastatin 10 mg PO nightly  Hemorrhoids- (present on admission) Gastroenterology now signed off and deferring colonoscopy to outpatient setting.  Continue Anusol.  Outpatient follow-up with GI scheduled on 06/11/2021   Hyperkalemia- (present on admission) Patient with elevated potassium, received Lokelma and improved/resolved with HD.  Potassium 4.7 at time of discharge.  Morbid obesity with BMI of 60.0-69.9, adult (HCC) Body mass index is 60.83 kg/m.  Discussed with patient needs for aggressive lifestyle changes/weight loss as this complicates all facets of care. Outpatient follow-up with PCP.  May benefit from bariatric evaluation  outpatient.        Discharge Diagnoses:  Principal Problem:   NSTEMI (non-ST elevated myocardial infarction) ([place]) Active Problems:   Rectal bleeding   ESRD (end stage renal disease) ([place])   HTN (hypertension)   Chronic respiratory failure with hypoxia (HCC)   Anemia of chronic renal failure   Type 2 diabetes mellitus with other specified complication (HCC)   Bilateral foot pain   Hyperlipidemia   Hemorrhoids   Hyperkalemia   Morbid obesity with BMI of 60.0-69.9, adult [facility])    Discharge Instructions  Discharge Instructions     Call MD for:  difficulty breathing, headache or visual disturbances   Complete by: As directed    Call MD for:  extreme fatigue   Complete by: As directed    Call MD for:  persistant dizziness or light-headedness   Complete by: As directed    Call MD for:  persistant nausea and vomiting   Complete by: As directed    Call MD for:  severe uncontrolled pain   Complete by: As directed    Call MD for:  temperature >100.4   Complete by: As directed    Diet - low sodium heart healthy   Complete by: As directed    Increase activity slowly   Complete by: As directed       Allergies as of 05/24/2021       Reactions   Benzonatate Other (See Comments)   Hallucinations   Hydralazine Other (See Comments)   Hallucinations   Amoxicillin Nausea And Vomiting   Clonidine Other (See Comments)   Altered mental state   Morphine Itching   Oxycodone Itching        Medication List     STOP taking these medications    isosorbide mononitrate 60 MG 24 hr tablet Commonly known as: IMDUR   lisinopril 20 MG tablet Commonly known as: ZESTRIL  TAKE these medications    acetaminophen 500 MG tablet Commonly known as: TYLENOL Take 1,000 mg by mouth every 6 (six) hours as needed for moderate pain or headache.   amLODipine 10 MG tablet Commonly known as: NORVASC Take 1 tablet (10 mg total) by mouth daily. What changed:  when to take  this reasons to take this   aspirin EC 81 MG tablet Take 1 tablet (81 mg total) by mouth daily with breakfast.   atorvastatin 10 MG tablet Commonly known as: LIPITOR Take 1 tablet (10 mg total) by mouth daily. What changed: when to take this   carvedilol 25 MG tablet Commonly known as: COREG Take 1 tablet (25 mg total) by mouth 2 (two) times daily. What changed: additional instructions   CHLORASEPTIC MT Use as directed 4 sprays in the mouth or throat 4 (four) times daily as needed (throat pain).   diphenhydrAMINE 25 MG tablet Commonly known as: BENADRYL Take 25 mg by mouth daily as needed for allergies or sleep.   docusate sodium 100 MG capsule Commonly known as: COLACE Take 100 mg by mouth 2 (two) times daily.   doxercalciferol 4 MCG/2ML injection Commonly known as: HECTOROL Inject 2.5 mLs (5 mcg total) into the vein Every Tuesday,Thursday,and Saturday with dialysis.   gabapentin 100 MG capsule Commonly known as: NEURONTIN Take 100-200 mg by mouth See admin instructions. Take 100 mg twice daily on non dialysis days.  Take 100 mg at 9am before dialysis. Take 100-200 mg at bedtime. May take a 100 mg in the evening as needed for pain. Tuesdays, Thursdays,and Saturdays.   glipiZIDE 2.5 MG 24 hr tablet Commonly known as: GLUCOTROL XL Take 2.5 mg by mouth daily.   hydrocortisone 25 MG suppository Commonly known as: ANUSOL-HC Place 1 suppository (25 mg total) rectally 2 (two) times daily for 3 days.   Lantus SoloStar 100 UNIT/ML Solostar Pen Generic drug: insulin glargine Inject 12 Units into the skin at bedtime.   loperamide 2 MG tablet Commonly known as: IMODIUM A-D Take 4 mg by mouth as needed for diarrhea or loose stools.   nitroGLYCERIN 0.4 MG SL tablet Commonly known as: NITROSTAT Place 0.4 mg under the tongue every 5 (five) minutes as needed for chest pain.   pantoprazole 40 MG tablet Commonly known as: PROTONIX Take 1 tablet (40 mg total) by mouth daily.    tiZANidine 4 MG tablet Commonly known as: ZANAFLEX Take 4 mg by mouth every 8 (eight) hours. Take with gabapentin at same time   Tums Smoothies 750 MG chewable tablet Generic drug: calcium carbonate Chew 750-1,500 mg by mouth See admin instructions. Take 1500 mg with each meal and 750 mg with each snack        Follow-up Information     Care, [facility] Follow up.   Specialty: Home Health Services Why: [place] to call with visit times. HD days are TTS- please shedule on off days- MWF. Contact information: [address] [place] [address] [phone]         Medicine, [facility]. Schedule an appointment as soon as possible for a visit in 1 week(s).   Specialty: Internal Medicine Contact information: [place] [address] [phone]                Allergies  Allergen Reactions   Benzonatate Other (See Comments)    Hallucinations   Hydralazine Other (See Comments)    Hallucinations   Amoxicillin Nausea  And Vomiting   Clonidine Other (See Comments)    Altered mental state   Morphine Itching   Oxycodone Itching    Consultations: Cardiology Nephrology Gastroenterology   Procedures/Studies: CT ABDOMEN PELVIS WO CONTRAST  Result Date: 05/19/2021 CLINICAL DATA:  Rectal bleeding, rule out GMS EXAM: CT ABDOMEN AND PELVIS WITHOUT CONTRAST TECHNIQUE: Multidetector CT imaging of the abdomen and pelvis was performed following the standard protocol without IV contrast. RADIATION DOSE REDUCTION: This exam was performed according to the departmental dose-optimization program which includes automated exposure control, adjustment of the mA and/or kV according to patient size and/or use of iterative reconstruction technique. COMPARISON:  09/02/2020 FINDINGS: Lower chest: No acute abnormality. Extensive three-vessel artery calcifications. Hepatobiliary: No solid liver abnormality is seen. No gallstones,  gallbladder wall thickening, or biliary dilatation. Pancreas: Unremarkable. No pancreatic ductal dilatation or surrounding inflammatory changes. Spleen: Normal in size without significant abnormality. Adrenals/Urinary Tract: Adrenal glands are unremarkable. Very atrophic bilateral kidneys. No calculi or hydronephrosis. Bladder is unremarkable. Stomach/Bowel: Stomach is within normal limits. Appendix appears normal. No evidence of bowel wall thickening, distention, or inflammatory changes. Previously ingested barium contrast admixed with stool in the distal colon and rectum. Vascular/Lymphatic: Aortic atherosclerosis and vascular calcinosis. No enlarged abdominal or pelvic lymph nodes. Reproductive: No mass or other significant abnormality. Other: No abdominal wall hernia or abnormality. No ascites. Musculoskeletal: No acute or significant osseous findings. IMPRESSION: 1. Previously ingested barium contrast admixed with stool in the distal colon and rectum. Within this limitation, no acute noncontrast CT findings of the abdomen or pelvis to explain or localize rectal bleeding. 2. Very atrophic bilateral kidneys. 3. Coronary artery disease. Aortic Atherosclerosis (ICD10-I70.0). Electronically Signed   By: Delanna Ahmadi M.D.   On: 05/19/2021 18:02   IR Fluoro Guide CV Line Right  Result Date: 05/13/2021 INDICATION: 51 year old female with a history of end-stage renal disease on hemodialysis via a left IJ 28 cm palindrome hemodialysis catheter. This catheter was last exchanged on 02/20/2021 at which time balloon disruption of an underlying fibrin sheath was performed. The catheter then worked well until recently when she began experience positional dependence and poor flows during hemodialysis. She presents for catheter evaluation with possible repeat balloon disruption and replacement. Of note, in the future the patient would like to switch to a right sided hemodialysis catheter. However, she was not prepared for  moderate sedation today. EXAM: IR RIGHT FLUORO GUIDE CV LINE; IR PTA VENOUS EXCEPT DIALYSIS CIRCUIT MEDICATIONS: None. ANESTHESIA/SEDATION: None. FLUOROSCOPY TIME:  145 mGy air kerma COMPLICATIONS: None immediate. PROCEDURE: Informed written consent was obtained from the patient after a thorough discussion of the procedural risks, benefits and alternatives. All questions were addressed. Maximal Sterile Barrier Technique was utilized including caps, mask, sterile gowns, sterile gloves, sterile drape, hand hygiene and skin antiseptic. A timeout was performed prior to the initiation of the procedure. Initial spot fluoroscopic imaging was performed. The 28 cm palindrome tunneled hemodialysis catheter is relatively well position with the catheter tip overlying the upper right atrium. The venous port flushes and aspirates easily. The arterial port could not be aspirated. Contrast injection was first performed through the venous port. This demonstrates normal filling of the right atrium. Local anesthesia was attained by infiltration with 1% lidocaine. The retention cuff was released from the surrounding soft tissues using sharp dissection. A stiff glidewire was advanced through the venous lumen, through the right heart and into the inferior vena cava. As the catheter was manipulated, aspiration was maintained on the arterial port  in till there was successful aspiration of the indwelling heparin and blood. At this time, contrast was injected through the arterial lumen under digital subtraction angiography. There is clear evidence of a fibrin sheath along the distal aspect of the catheter. This is likely the underlying source of catheter malfunction. The catheter was removed. A 16 mm atlas balloon was advanced over the wire and positioned in the region of the fibrin sheath. The balloon was then inflated to full effacement disrupting the fibrin sheath. The balloon was removed. A new 28 cm palindrome tunneled hemodialysis  catheter was advanced over the wire and position with the tip in the right mid atrium. Both the arterial and venous lumens flush and aspirate easily. The catheter was flushed with saline in the lumens locked with 2.1 mL heparin. The catheter was capped and secured to the skin with 0 Prolene suture. Sterile bandages were applied. The patient tolerated the procedure well. IMPRESSION: 1. Fibrin sheath partially obstructing flow through the arterial lumen. 2. Successful balloon disruption of fibrin sheath. 3. Successful exchange for a new 28 cm palindrome tunneled hemodialysis catheter. Catheter tips are in the right mid atrium and the device is ready for immediate use. Electronically Signed   By: Jacqulynn Cadet M.D.   On: 05/13/2021 13:09   DG Chest Portable 1 View  Result Date: 05/16/2021 CLINICAL DATA:  IV access EXAM: PORTABLE CHEST 1 VIEW COMPARISON:  05/16/2021 FINDINGS: Left dialysis catheter with tip over the cavoatrial junction region, unchanged. New right central venous catheter is present with tip over the low SVC region. No pneumothorax. Cardiac enlargement. No vascular congestion, edema, or consolidation. Mediastinal contours appear intact. IMPRESSION: Appliances appear in satisfactory position.  Cardiac enlargement. Electronically Signed   By: Lucienne Capers M.D.   On: 05/16/2021 19:30   DG Chest Port 1 View  Result Date: 05/16/2021 CLINICAL DATA:  Shortness of breath, chest pain EXAM: PORTABLE CHEST 1 VIEW COMPARISON:  Previous studies including the examination of 04/22/2021 FINDINGS: Transverse diameter of heart is increased. Central pulmonary vessels are prominent without signs of alveolar pulmonary edema. There is no focal pulmonary consolidation. Left lateral CP angle is indistinct. There is questionable minimal blunting of right lateral CP angle. There is no pneumothorax. IMPRESSION: Cardiomegaly. Central pulmonary vessels are prominent suggesting mild CHF. There are no signs of alveolar  pulmonary edema or focal pulmonary consolidation. Small bilateral pleural effusions, more so on the left side. Electronically Signed   By: Elmer Picker M.D.   On: 05/16/2021 12:11   DG Foot 2 Views Left  Result Date: 05/17/2021 CLINICAL DATA:  Pain and swelling EXAM: LEFT FOOT - 2 VIEW COMPARISON:  None. FINDINGS: No fracture or dislocation is seen. Extensive arterial calcifications are seen. Plantar spur is seen in calcaneus. There is mild hallux valgus deformity. Cortical irregularity in the medial aspect of head of the first metatarsal may be residual from previous injury. IMPRESSION: No fracture or dislocation is seen. There are no focal lytic lesions. Plantar spur is seen in calcaneus. Electronically Signed   By: Elmer Picker M.D.   On: 05/17/2021 13:41   DG Foot 2 Views Right  Result Date: 05/17/2021 CLINICAL DATA:  Pain and swelling EXAM: RIGHT FOOT - 2 VIEW COMPARISON:  04/23/2021 FINDINGS: No fracture or dislocation is seen. Plantar spur is seen in calcaneus. Degenerative changes are noted in the intertarsal joints with bony spurs in the dorsal aspect. Extensive arterial calcifications are seen in the soft tissues. There is mild hallux valgus  deformity. IMPRESSION: No fracture or dislocation is seen. There are no focal lytic lesions. Plantar spur is seen in calcaneus. Electronically Signed   By: Elmer Picker M.D.   On: 05/17/2021 13:43   IR PTA VENOUS EXCEPT DIALYSIS CIRCUIT  Result Date: 05/13/2021 INDICATION: 50 year old female with a history of end-stage renal disease on hemodialysis via a left IJ 28 cm palindrome hemodialysis catheter. This catheter was last exchanged on 02/20/2021 at which time balloon disruption of an underlying fibrin sheath was performed. The catheter then worked well until recently when she began experience positional dependence and poor flows during hemodialysis. She presents for catheter evaluation with possible repeat balloon disruption and  replacement. Of note, in the future the patient would like to switch to a right sided hemodialysis catheter. However, she was not prepared for moderate sedation today. EXAM: IR RIGHT FLUORO GUIDE CV LINE; IR PTA VENOUS EXCEPT DIALYSIS CIRCUIT MEDICATIONS: None. ANESTHESIA/SEDATION: None. FLUOROSCOPY TIME:  145 mGy air kerma COMPLICATIONS: None immediate. PROCEDURE: Informed written consent was obtained from the patient after a thorough discussion of the procedural risks, benefits and alternatives. All questions were addressed. Maximal Sterile Barrier Technique was utilized including caps, mask, sterile gowns, sterile gloves, sterile drape, hand hygiene and skin antiseptic. A timeout was performed prior to the initiation of the procedure. Initial spot fluoroscopic imaging was performed. The 28 cm palindrome tunneled hemodialysis catheter is relatively well position with the catheter tip overlying the upper right atrium. The venous port flushes and aspirates easily. The arterial port could not be aspirated. Contrast injection was first performed through the venous port. This demonstrates normal filling of the right atrium. Local anesthesia was attained by infiltration with 1% lidocaine. The retention cuff was released from the surrounding soft tissues using sharp dissection. A stiff glidewire was advanced through the venous lumen, through the right heart and into the inferior vena cava. As the catheter was manipulated, aspiration was maintained on the arterial port in till there was successful aspiration of the indwelling heparin and blood. At this time, contrast was injected through the arterial lumen under digital subtraction angiography. There is clear evidence of a fibrin sheath along the distal aspect of the catheter. This is likely the underlying source of catheter malfunction. The catheter was removed. A 16 mm atlas balloon was advanced over the wire and positioned in the region of the fibrin sheath. The  balloon was then inflated to full effacement disrupting the fibrin sheath. The balloon was removed. A new 28 cm palindrome tunneled hemodialysis catheter was advanced over the wire and position with the tip in the right mid atrium. Both the arterial and venous lumens flush and aspirate easily. The catheter was flushed with saline in the lumens locked with 2.1 mL heparin. The catheter was capped and secured to the skin with 0 Prolene suture. Sterile bandages were applied. The patient tolerated the procedure well. IMPRESSION: 1. Fibrin sheath partially obstructing flow through the arterial lumen. 2. Successful balloon disruption of fibrin sheath. 3. Successful exchange for a new 28 cm palindrome tunneled hemodialysis catheter. Catheter tips are in the right mid atrium and the device is ready for immediate use. Electronically Signed   By: Jacqulynn Cadet M.D.   On: 05/13/2021 13:09   ECHOCARDIOGRAM COMPLETE  Result Date: 05/17/2021    ECHOCARDIOGRAM REPORT   Patient Name:   Courtney Gray Date of Exam: 05/17/2021 Medical Rec #:  [id number]       Height:       65.0 in  Accession #:    [id number]      Weight:       375.2 lb Date of Birth:  [birth year]        BSA:          2.585 m Patient Age:    51 years        BP:           127/83 mmHg Patient Gender: F               HR:           70 bpm. Exam Location:  Inpatient Procedure: 2D Echo, Cardiac Doppler, Color Doppler and Intracardiac            Opacification Agent Indications:    NSTEMI  History:        Patient has prior history of Echocardiogram examinations, most                 recent 04/23/2021. Risk Factors:Hypertension, Diabetes and                 Dyslipidemia. ESRD.  Sonographer:    Clayton Lefort RDCS (AE) Referring Phys: [id number] Leanne Chang [facility]  Sonographer Comments: Patient is morbidly obese. Image acquisition challenging due to patient body habitus. IMPRESSIONS  1. Left ventricular ejection fraction, by estimation, is 55 to 60%. The left ventricle has normal  function. The left ventricle has no regional wall motion abnormalities. There is moderate concentric left ventricular hypertrophy. Left ventricular diastolic parameters are consistent with Grade II diastolic dysfunction (pseudonormalization). Elevated left atrial pressure.  2. Right ventricular systolic function is normal. The right ventricular size is normal. Tricuspid regurgitation signal is inadequate for assessing PA pressure.  3. Left atrial size was mildly dilated.  4. The mitral valve is degenerative. Mild mitral valve regurgitation. No evidence of mitral stenosis. The mean mitral valve gradient is 3.0 mmHg with average heart rate of 68 bpm. Moderate to severe mitral annular calcification.  5. The aortic valve is tricuspid. There is moderate calcification of the aortic valve. There is moderate thickening of the aortic valve. Aortic valve regurgitation is not visualized. Mild aortic valve stenosis. Aortic valve mean gradient measures 13.0 mmHg. Aortic valve Vmax measures 2.45 m/s. Comparison(s): Prior images reviewed side by side. There is now mild aortic stenosis. FINDINGS  Left Ventricle: Left ventricular ejection fraction, by estimation, is 55 to 60%. The left ventricle has normal function. The left ventricle has no regional wall motion abnormalities. Definity contrast agent was given IV to delineate the left ventricular  endocardial borders. The left ventricular internal cavity size was normal in size. There is moderate concentric left ventricular hypertrophy. Left ventricular diastolic parameters are consistent with Grade II diastolic dysfunction (pseudonormalization).  Elevated left atrial pressure. Right Ventricle: The right ventricular size is normal. No increase in right ventricular wall thickness. Right ventricular systolic function is normal. Tricuspid regurgitation signal is inadequate for assessing PA pressure. Left Atrium: Left atrial size was mildly dilated. Right Atrium: Right atrial size was  normal in size. Pericardium: There is no evidence of pericardial effusion. Mitral Valve: The mitral valve is degenerative in appearance. Moderate to severe mitral annular calcification. Mild mitral valve regurgitation. No evidence of mitral valve stenosis. MV peak gradient, 6.7 mmHg. The mean mitral valve gradient is 3.0 mmHg with average heart rate of 68 bpm. Tricuspid Valve: The tricuspid valve is normal in structure. Tricuspid valve regurgitation is trivial. Aortic Valve: The aortic valve is tricuspid. There is moderate  calcification of the aortic valve. There is moderate thickening of the aortic valve. Aortic valve regurgitation is not visualized. Mild aortic stenosis is present. Aortic valve mean gradient measures 13.0 mmHg. Aortic valve peak gradient measures 24.0 mmHg. Aortic valve area, by VTI measures 1.58 cm. Pulmonic Valve: The pulmonic valve was grossly normal. Pulmonic valve regurgitation is not visualized. Aorta: The aortic root is normal in size and structure. IAS/Shunts: No atrial level shunt detected by color flow Doppler.  LEFT VENTRICLE PLAX 2D LVIDd:         5.00 cm   Diastology LVIDs:         3.40 cm   LV e' medial:    5.70 cm/s LV PW:         1.60 cm   LV E/e' medial:  20.7 LV IVS:        1.80 cm   LV e' lateral:   7.58 cm/s LVOT diam:     2.16 cm   LV E/e' lateral: 15.6 LV SV:         81 LV SV Index:   31 LVOT Area:     3.66 cm  RIGHT VENTRICLE RV Basal diam:  3.30 cm RV S prime:     17.70 cm/s TAPSE (M-mode): 3.3 cm LEFT ATRIUM              Index        RIGHT ATRIUM           Index LA diam:        3.00 cm  1.16 cm/m   RA Area:     14.60 cm LA Vol (A2C):   104.0 ml 40.24 ml/m  RA Volume:   37.00 ml  14.32 ml/m LA Vol (A4C):   67.4 ml  26.08 ml/m LA Biplane Vol: 83.6 ml  32.35 ml/m  AORTIC VALVE AV Area (Vmax):    1.47 cm AV Area (Vmean):   1.41 cm AV Area (VTI):     1.58 cm AV Vmax:           245.00 cm/s AV Vmean:          168.000 cm/s AV VTI:            0.516 m AV Peak Grad:       24.0 mmHg AV Mean Grad:      13.0 mmHg LVOT Vmax:         98.50 cm/s LVOT Vmean:        64.600 cm/s LVOT VTI:          0.222 m LVOT/AV VTI ratio: 0.43  AORTA Ao Root diam: 3.30 cm Ao Asc diam:  3.40 cm MITRAL VALVE MV Area (PHT): 2.41 cm     SHUNTS MV Peak grad:  6.7 mmHg     Systemic VTI:  0.22 m MV Mean grad:  3.0 mmHg     Systemic Diam: 2.16 cm MV Vmax:       1.29 m/s MV Vmean:      83.9 cm/s MV Decel Time: 315 msec MV E velocity: 118.00 cm/s MV A velocity: 108.00 cm/s MV E/A ratio:  1.09 Mihai Croitoru MD Electronically signed by Sanda Klein MD Signature Date/Time: 05/17/2021/1:29:46 PM    Final    [place] EKG SITE RITE  Result Date: 05/16/2021 If Site Rite image not attached, placement could not be confirmed due to current cardiac rhythm.    Subjective: Patient seen examined bedside, resting comfortably.  Multiple family members present.  Awaiting for HD catheter exchange this morning then plan for discharge home.  Has outpatient follow-up with cardiology and gastroenterology.  No other questions or concerns at this time.  Denies headache, no visual changes, no chest pain, no palpitations, no shortness of breath, no abdominal pain, no weakness, no fatigue, no paresthesias.  No acute events overnight per nursing staff.  Discharge Exam: Vitals:   05/24/21 1155 05/24/21 1200  BP: (!) 163/68 (!) 165/69  Pulse: 67 66  Resp: 16 16  Temp:    SpO2: 100% 100%   Vitals:   05/24/21 0435 05/24/21 1140 05/24/21 1155 05/24/21 1200  BP: (!) 149/45 (!) 163/53 (!) 163/68 (!) 165/69  Pulse: 74 66 67 66  Resp:  16 16 16   Temp: 98.1 F (36.7 C)     TempSrc: Oral     SpO2: 100% 100% 100% 100%  Weight:      Height:        Physical Exam: GEN: NAD, alert and oriented x 3, obese HEENT: NCAT, PERRL, EOMI, sclera clear, MMM PULM: CTAB w/o wheezes/crackles, normal respiratory effort, on 2 L nasal cannula which is her baseline CV: RRR w/o M/G/R GI: abd soft, NTND, NABS, no R/G/M MSK: no peripheral edema,  muscle strength globally intact 5/5 bilateral upper/lower extremities NEURO: CN II-XII intact, no focal deficits, sensation to light touch intact PSYCH: normal mood/affect Integumentary: dry/intact, no rashes or wounds    The results of significant diagnostics from this hospitalization (including imaging, microbiology, ancillary and laboratory) are listed below for reference.     Microbiology: Recent Results (from the past 240 hour(s))  Resp Panel by RT-PCR (Flu A&B, Covid) Nasopharyngeal Swab     Status: None   Collection Time: 05/16/21 12:35 PM   Specimen: Nasopharyngeal Swab; Nasopharyngeal(NP) swabs in vial transport medium  Result Value Ref Range Status   SARS Coronavirus 2 by RT PCR NEGATIVE NEGATIVE Final    Comment: (NOTE) SARS-CoV-2 target nucleic acids are NOT DETECTED.  The SARS-CoV-2 RNA is generally detectable in upper respiratory specimens during the acute phase of infection. The lowest concentration of SARS-CoV-2 viral copies this assay can detect is 138 copies/mL. A negative result does not preclude SARS-Cov-2 infection and should not be used as the sole basis for treatment or other patient management decisions. A negative result may occur with  improper specimen collection/handling, submission of specimen other than nasopharyngeal swab, presence of viral mutation(s) within the areas targeted by this assay, and inadequate number of viral copies(<138 copies/mL). A negative result must be combined with clinical observations, patient history, and epidemiological information. The expected result is Negative.  Fact Sheet for Patients:  EntrepreneurPulse.com.au  Fact Sheet for Healthcare Providers:  IncredibleEmployment.be  This test is no t yet approved or cleared by the [place] FDA and  has been authorized for detection and/or diagnosis of SARS-CoV-2 by FDA under an Emergency Use Authorization (EUA). This EUA will remain   in effect (meaning this test can be used) for the duration of the COVID-19 declaration under Section 564(b)(1) of the Act, 21 U.S.C.section 360bbb-3(b)(1), unless the authorization is terminated  or revoked sooner.       Influenza A by PCR NEGATIVE NEGATIVE Final   Influenza B by PCR NEGATIVE NEGATIVE Final    Comment: (NOTE) The Xpert Xpress SARS-CoV-2/FLU/RSV plus assay is intended as an aid in the diagnosis of influenza from Nasopharyngeal swab specimens and should not be used as a sole basis for treatment. Nasal washings and aspirates are unacceptable  for Xpert Xpress SARS-CoV-2/FLU/RSV testing.  Fact Sheet for Patients: EntrepreneurPulse.com.au  Fact Sheet for Healthcare Providers: IncredibleEmployment.be  This test is not yet approved or cleared by the [place] FDA and has been authorized for detection and/or diagnosis of SARS-CoV-2 by FDA under an Emergency Use Authorization (EUA). This EUA will remain in effect (meaning this test can be used) for the duration of the COVID-19 declaration under Section 564(b)(1) of the Act, 21 U.S.C. section 360bbb-3(b)(1), unless the authorization is terminated or revoked.  Performed at [facility], [address], [place], [place] [address]      Labs: BNP (last 3 results) No results for input(s): BNP in the last 8760 hours. Basic Metabolic Panel: Recent Labs  Lab 05/18/21 0148 05/19/21 0404 05/20/21 0618 05/21/21 0086 05/22/21 0606 05/23/21 0637 05/24/21 0456  NA 134* 134* 134* 132* 134* 134* 132*  K 4.7 4.7 4.9 5.3* 6.2* 5.4* 4.7  CL 97* 95* 96* 94* 96* 95* 95*  CO2 25 26 24 22 23 23 24   GLUCOSE 236* 187* 287* 239* 206* 232* 227*  BUN 47* 39* 58* 72* 84* 67* 52*  CREATININE 11.57* 10.92* 13.96* 16.61* 18.74* 15.77* 12.75*  CALCIUM 8.6* 9.6 8.7* 9.1 8.8* 9.2 9.0  MG 2.3 2.2 2.2 2.4 2.5*  --   --   PHOS 6.2* 6.8* 8.3* 9.1* 10.6*  --  8.0*   Liver Function Tests: Recent Labs   Lab 05/18/21 0148 05/19/21 0404 05/20/21 0618 05/21/21 0638 05/22/21 0606 05/24/21 0456  AST 14* 14* 15 15 13*  --   ALT 14 14 13 14 13   --   ALKPHOS 59 64 57 58 55  --   BILITOT 0.3 0.4 0.5 0.4 0.5  --   PROT 7.0 7.9 7.3 7.8 7.5  --   ALBUMIN 3.1* 3.3* 3.1* 3.4* 3.2* 3.1*   No results for input(s): LIPASE, AMYLASE in the last 168 hours. No results for input(s): AMMONIA in the last 168 hours. CBC: Recent Labs  Lab 05/18/21 0148 05/19/21 0404 05/20/21 0618 05/21/21 7619 05/22/21 0606 05/23/21 0637  WBC 11.4* 11.3* 9.5 11.7* 11.6* 10.5  NEUTROABS 8.9* 8.5* 6.8 8.7* 8.7*  --   HGB 7.9* 8.3* 7.7* 7.9* 7.4* 7.7*  HCT 26.1* 28.1* 25.8* 26.7* 25.5* 26.1*  MCV 99.6 98.9 100.4* 98.9 100.4* 100.8*  PLT 187 176 167 183 179 178   Cardiac Enzymes: No results for input(s): CKTOTAL, CKMB, CKMBINDEX, TROPONINI in the last 168 hours. BNP: Invalid input(s): POCBNP CBG: Recent Labs  Lab 05/23/21 0743 05/23/21 1142 05/23/21 1646 05/23/21 2055 05/24/21 0759  GLUCAP 208* 164* 138* 150* 166*   D-Dimer No results for input(s): DDIMER in the last 72 hours. Hgb A1c No results for input(s): HGBA1C in the last 72 hours. Lipid Profile No results for input(s): CHOL, HDL, LDLCALC, TRIG, CHOLHDL, LDLDIRECT in the last 72 hours. Thyroid function studies No results for input(s): TSH, T4TOTAL, T3FREE, THYROIDAB in the last 72 hours.  Invalid input(s): FREET3 Anemia work up No results for input(s): VITAMINB12, FOLATE, FERRITIN, TIBC, IRON, RETICCTPCT in the last 72 hours. Urinalysis No results found for: COLORURINE, APPEARANCEUR, [place], [place], [place], [place], [place], [place], PROTEINUR, UROBILINOGEN, NITRITE, LEUKOCYTESUR Sepsis Labs Invalid input(s): PROCALCITONIN,  WBC,  LACTICIDVEN Microbiology Recent Results (from the past 240 hour(s))  Resp Panel by RT-PCR (Flu A&B, Covid) Nasopharyngeal Swab     Status: None   Collection Time: 05/16/21 12:35 PM   Specimen:  Nasopharyngeal Swab; Nasopharyngeal(NP) swabs in vial transport medium  Result Value  Ref Range Status   SARS Coronavirus 2 by RT PCR NEGATIVE NEGATIVE Final    Comment: (NOTE) SARS-CoV-2 target nucleic acids are NOT DETECTED.  The SARS-CoV-2 RNA is generally detectable in upper respiratory specimens during the acute phase of infection. The lowest concentration of SARS-CoV-2 viral copies this assay can detect is 138 copies/mL. A negative result does not preclude SARS-Cov-2 infection and should not be used as the sole basis for treatment or other patient management decisions. A negative result may occur with  improper specimen collection/handling, submission of specimen other than nasopharyngeal swab, presence of viral mutation(s) within the areas targeted by this assay, and inadequate number of viral copies(<138 copies/mL). A negative result must be combined with clinical observations, patient history, and epidemiological information. The expected result is Negative.  Fact Sheet for Patients:  EntrepreneurPulse.com.au  Fact Sheet for Healthcare Providers:  IncredibleEmployment.be  This test is no t yet approved or cleared by the [place] FDA and  has been authorized for detection and/or diagnosis of SARS-CoV-2 by FDA under an Emergency Use Authorization (EUA). This EUA will remain  in effect (meaning this test can be used) for the duration of the COVID-19 declaration under Section 564(b)(1) of the Act, 21 U.S.C.section 360bbb-3(b)(1), unless the authorization is terminated  or revoked sooner.       Influenza A by PCR NEGATIVE NEGATIVE Final   Influenza B by PCR NEGATIVE NEGATIVE Final    Comment: (NOTE) The Xpert Xpress SARS-CoV-2/FLU/RSV plus assay is intended as an aid in the diagnosis of influenza from Nasopharyngeal swab specimens and should not be used as a sole basis for treatment. Nasal washings and aspirates are unacceptable for  Xpert Xpress SARS-CoV-2/FLU/RSV testing.  Fact Sheet for Patients: EntrepreneurPulse.com.au  Fact Sheet for Healthcare Providers: IncredibleEmployment.be  This test is not yet approved or cleared by the [place] FDA and has been authorized for detection and/or diagnosis of SARS-CoV-2 by FDA under an Emergency Use Authorization (EUA). This EUA will remain in effect (meaning this test can be used) for the duration of the COVID-19 declaration under Section 564(b)(1) of the Act, 21 U.S.C. section 360bbb-3(b)(1), unless the authorization is terminated or revoked.  Performed at [facility], [address] [place], [place] [address]      Time coordinating discharge: Over 30 minutes  SIGNED:   Jhovanny Guinta J [place] DO  [facility] 05/24/2021, 12:05 PM.

## 2021-05-24 NOTE — Consult Note (Signed)
Chief Complaint: Low pressures during dialysis.Request if for tunneled HD catheter exchange -placement ( possible move to RIJ). Possible fibrin sheath removal.   Referring Physician(s): L. Penninger PA  Supervising Physician: Arne Cleveland  Patient Status: [facility] - In-pt  History of Present Illness: Courtney Gray is a 51 y.o. female History of DM, HTN, ESRD via HD via a LIJ 28 cm palindrome catheter.  Presented to the ED  at [facility] on 2.9.23 with melena and persistent chest pain. Last exchanged on 2.6.23 with angioplasty and fibrin sheath removal.  Found to have sluggish return on the arterial line and low pressures. LIJ placed on 6.2022 after line holiday Per procedure note dated 2.6.23. The patient would like a RIJ catheter during her next exchange. Patient presents for tunneled HD catheter exchange - placement and possible fibrin sheath removal.   Endorses anxiety. Patient alert and laying in bed, calm and comfortable. Denies any fevers, headache, chest pain, SOB, cough, abdominal pain, nausea, vomiting or bleeding. Return precautions and treatment recommendations and follow-up discussed with the patient  who is agreeable with the plan.    Past Medical History:  Diagnosis Date   Diabetes mellitus without complication (HCC)    High cholesterol    Hypertension    Renal disorder     Past Surgical History:  Procedure Laterality Date   AV FISTULA PLACEMENT     x2, unsuccessful.    IR FLUORO GUIDE CV LINE LEFT  12/14/2020   IR FLUORO GUIDE CV LINE LEFT  02/20/2021   IR FLUORO GUIDE CV LINE RIGHT  08/28/2020   IR FLUORO GUIDE CV LINE RIGHT  05/13/2021   IR PERC TUN PERIT CATH WO PORT S&I /IMAG  09/06/2020   IR PTA VENOUS EXCEPT DIALYSIS CIRCUIT  02/20/2021   IR PTA VENOUS EXCEPT DIALYSIS CIRCUIT  05/13/2021   IR US GUIDE VASC ACCESS LEFT  09/06/2020   IR VENOCAVAGRAM SVC  02/20/2021    Allergies: Benzonatate, Hydralazine, Amoxicillin, Clonidine, Morphine, and  Oxycodone  Medications: Prior to Admission medications   Medication Sig Start Date End Date Taking? Authorizing Provider  acetaminophen (TYLENOL) 500 MG tablet Take 1,000 mg by mouth every 6 (six) hours as needed for moderate pain or headache.   Yes [provider]  amLODipine (NORVASC) 10 MG tablet Take 1 tablet (10 mg total) by mouth daily. Patient taking differently: Take 10 mg by mouth daily as needed (Blood pressure). 11/29/20  Yes Hemophage, Courage, MD  aspirin EC 81 MG tablet Take 1 tablet (81 mg total) by mouth daily with breakfast. 11/28/20  Yes Hemophage, Courage, MD  atorvastatin (LIPITOR) 10 MG tablet Take 1 tablet (10 mg total) by mouth daily. Patient taking differently: Take 10 mg by mouth at bedtime. 11/28/20  Yes Hemophage, Courage, MD  calcium carbonate (TUMS SMOOTHIES) 750 MG chewable tablet Chew 750-1,500 mg by mouth See admin instructions. Take 1500 mg with each meal and 750 mg with each snack   Yes [provider]  carvedilol (COREG) 25 MG tablet Take 1 tablet (25 mg total) by mouth 2 (two) times daily. Patient taking differently: Take 25 mg by mouth 2 (two) times daily. Do not take on dialysis days Tuesday Thur. And Sat. Take as needed. Take if blood pressure is over 140 11/28/20  Yes Hemophage, Courage, MD  diphenhydrAMINE (BENADRYL) 25 MG tablet Take 25 mg by mouth daily as needed for allergies or sleep.   Yes [provider]  docusate sodium (COLACE) 100 MG capsule Take 100  mg by mouth 2 (two) times daily.   Yes [provider]  doxercalciferol (HECTOROL) 4 MCG/2ML injection Inject 2.5 mLs (5 mcg total) into the vein Every Tuesday,Thursday,and Saturday with dialysis. 09/01/20  Yes Enzo Bi, MD  gabapentin (NEURONTIN) 100 MG capsule Take 100-200 mg by mouth See admin instructions. Take 100 mg twice daily on non dialysis days.  Take 100 mg at 9am before dialysis. Take 100-200 mg at bedtime. May take a 100 mg in the evening as needed for pain.  Tuesdays, Thursdays,and Saturdays. 04/20/19  Yes [provider]  glipiZIDE (GLUCOTROL XL) 2.5 MG 24 hr tablet Take 2.5 mg by mouth daily. 03/22/21  Yes [provider]  isosorbide mononitrate (IMDUR) 60 MG 24 hr tablet Take 1 tablet (60 mg total) by mouth daily. 04/23/21 04/23/22 Yes Hemophage, Courage, MD  LANTUS SOLOSTAR 100 UNIT/ML Solostar Pen Inject 12 Units into the skin at bedtime. 03/18/19  Yes [provider]  lisinopril (ZESTRIL) 20 MG tablet Take 1 tablet (20 mg total) by mouth daily. Patient taking differently: Take 20 mg by mouth daily as needed (Blood pressure). 11/28/20  Yes Hemophage, Courage, MD  loperamide (IMODIUM A-D) 2 MG tablet Take 4 mg by mouth as needed for diarrhea or loose stools.   Yes [provider]  nitroGLYCERIN (NITROSTAT) 0.4 MG SL tablet Place 0.4 mg under the tongue every 5 (five) minutes as needed for chest pain.   Yes [provider]  pantoprazole (PROTONIX) 40 MG tablet Take 1 tablet (40 mg total) by mouth daily. 11/29/20  Yes Hemophage, Courage, MD  Phenol (CHLORASEPTIC MT) Use as directed 4 sprays in the mouth or throat 4 (four) times daily as needed (throat pain).   Yes [provider]  tiZANidine (ZANAFLEX) 4 MG tablet Take 4 mg by mouth every 8 (eight) hours. Take with gabapentin at same time 04/20/19  Yes [provider]     Family History  Problem Relation Age of Onset   Diabetes Mother    Diabetes Father     Social History   Socioeconomic History   Marital status: Single    Spouse name: Not on file   Number of children: Not on file   Years of education: Not on file   Highest education level: Not on file  Occupational History   Not on file  Tobacco Use   Smoking status: Never   Smokeless tobacco: Never  Vaping Use   Vaping Use: Never used  Substance and Sexual Activity   Alcohol use: Never   Drug use: Never   Sexual activity: Not on file  Other Topics Concern   Not on file   Social History Narrative   Not on file   Social Determinants of Health   Financial Resource Strain: Not on file  Food Insecurity: Not on file  Transportation Needs: Not on file  Physical Activity: Not on file  Stress: Not on file  Social Connections: Not on file    Review of Systems: A 12 point ROS discussed and pertinent positives are indicated in the HPI above.  All other systems are negative.  Review of Systems  Constitutional:  Negative for fatigue and fever.  HENT:  Negative for congestion.   Respiratory:  Negative for cough and shortness of breath.   Gastrointestinal:  Negative for abdominal pain, diarrhea, nausea and vomiting.   Vital Signs: BP (!) 149/45 (BP Location: Left Arm)    Pulse 74    Temp 98.1 F (36.7 C) (Oral)  Resp 14    Ht 5\' 5"  (1.651 m)    Wt (!) 365 lb 8.4 oz (165.8 kg)    SpO2 100%    BMI 60.83 kg/m   Physical Exam Vitals and nursing note reviewed.  Constitutional:      Appearance: She is well-developed.  HENT:     Head: Normocephalic and atraumatic.  Eyes:     Conjunctiva/sclera: Conjunctivae normal.  Cardiovascular:     Rate and Rhythm: Normal rate and regular rhythm.     Heart sounds: Normal heart sounds.     Comments: LIJ tunneled HD catheter. RIJ central line. Pulmonary:     Effort: Pulmonary effort is normal.     Breath sounds: Normal breath sounds.  Musculoskeletal:     Cervical back: Normal range of motion.  Neurological:     Mental Status: She is alert and oriented to person, place, and time.    Imaging: CT ABDOMEN PELVIS WO CONTRAST  Result Date: 05/19/2021 CLINICAL DATA:  Rectal bleeding, rule out GMS EXAM: CT ABDOMEN AND PELVIS WITHOUT CONTRAST TECHNIQUE: Multidetector CT imaging of the abdomen and pelvis was performed following the standard protocol without IV contrast. RADIATION DOSE REDUCTION: This exam was performed according to the departmental dose-optimization program which includes automated exposure control,  adjustment of the mA and/or kV according to patient size and/or use of iterative reconstruction technique. COMPARISON:  09/02/2020 FINDINGS: Lower chest: No acute abnormality. Extensive three-vessel artery calcifications. Hepatobiliary: No solid liver abnormality is seen. No gallstones, gallbladder wall thickening, or biliary dilatation. Pancreas: Unremarkable. No pancreatic ductal dilatation or surrounding inflammatory changes. Spleen: Normal in size without significant abnormality. Adrenals/Urinary Tract: Adrenal glands are unremarkable. Very atrophic bilateral kidneys. No calculi or hydronephrosis. Bladder is unremarkable. Stomach/Bowel: Stomach is within normal limits. Appendix appears normal. No evidence of bowel wall thickening, distention, or inflammatory changes. Previously ingested barium contrast admixed with stool in the distal colon and rectum. Vascular/Lymphatic: Aortic atherosclerosis and vascular calcinosis. No enlarged abdominal or pelvic lymph nodes. Reproductive: No mass or other significant abnormality. Other: No abdominal wall hernia or abnormality. No ascites. Musculoskeletal: No acute or significant osseous findings. IMPRESSION: 1. Previously ingested barium contrast admixed with stool in the distal colon and rectum. Within this limitation, no acute noncontrast CT findings of the abdomen or pelvis to explain or localize rectal bleeding. 2. Very atrophic bilateral kidneys. 3. Coronary artery disease. Aortic Atherosclerosis (ICD10-I70.0). Electronically Signed   By: Delanna Ahmadi M.D.   On: 05/19/2021 18:02   IR Fluoro Guide CV Line Right  Result Date: 05/13/2021 INDICATION: 51 year old female with a history of end-stage renal disease on hemodialysis via a left IJ 28 cm palindrome hemodialysis catheter. This catheter was last exchanged on 02/20/2021 at which time balloon disruption of an underlying fibrin sheath was performed. The catheter then worked well until recently when she began  experience positional dependence and poor flows during hemodialysis. She presents for catheter evaluation with possible repeat balloon disruption and replacement. Of note, in the future the patient would like to switch to a right sided hemodialysis catheter. However, she was not prepared for moderate sedation today. EXAM: IR RIGHT FLUORO GUIDE CV LINE; IR PTA VENOUS EXCEPT DIALYSIS CIRCUIT MEDICATIONS: None. ANESTHESIA/SEDATION: None. FLUOROSCOPY TIME:  145 mGy air kerma COMPLICATIONS: None immediate. PROCEDURE: Informed written consent was obtained from the patient after a thorough discussion of the procedural risks, benefits and alternatives. All questions were addressed. Maximal Sterile Barrier Technique was utilized including caps, mask, sterile gowns, sterile gloves, sterile  drape, hand hygiene and skin antiseptic. A timeout was performed prior to the initiation of the procedure. Initial spot fluoroscopic imaging was performed. The 28 cm palindrome tunneled hemodialysis catheter is relatively well position with the catheter tip overlying the upper right atrium. The venous port flushes and aspirates easily. The arterial port could not be aspirated. Contrast injection was first performed through the venous port. This demonstrates normal filling of the right atrium. Local anesthesia was attained by infiltration with 1% lidocaine. The retention cuff was released from the surrounding soft tissues using sharp dissection. A stiff glidewire was advanced through the venous lumen, through the right heart and into the inferior vena cava. As the catheter was manipulated, aspiration was maintained on the arterial port in till there was successful aspiration of the indwelling heparin and blood. At this time, contrast was injected through the arterial lumen under digital subtraction angiography. There is clear evidence of a fibrin sheath along the distal aspect of the catheter. This is likely the underlying source of catheter  malfunction. The catheter was removed. A 16 mm atlas balloon was advanced over the wire and positioned in the region of the fibrin sheath. The balloon was then inflated to full effacement disrupting the fibrin sheath. The balloon was removed. A new 28 cm palindrome tunneled hemodialysis catheter was advanced over the wire and position with the tip in the right mid atrium. Both the arterial and venous lumens flush and aspirate easily. The catheter was flushed with saline in the lumens locked with 2.1 mL heparin. The catheter was capped and secured to the skin with 0 Prolene suture. Sterile bandages were applied. The patient tolerated the procedure well. IMPRESSION: 1. Fibrin sheath partially obstructing flow through the arterial lumen. 2. Successful balloon disruption of fibrin sheath. 3. Successful exchange for a new 28 cm palindrome tunneled hemodialysis catheter. Catheter tips are in the right mid atrium and the device is ready for immediate use. Electronically Signed   By: Jacqulynn Cadet M.D.   On: 05/13/2021 13:09   DG Chest Portable 1 View  Result Date: 05/16/2021 CLINICAL DATA:  IV access EXAM: PORTABLE CHEST 1 VIEW COMPARISON:  05/16/2021 FINDINGS: Left dialysis catheter with tip over the cavoatrial junction region, unchanged. New right central venous catheter is present with tip over the low SVC region. No pneumothorax. Cardiac enlargement. No vascular congestion, edema, or consolidation. Mediastinal contours appear intact. IMPRESSION: Appliances appear in satisfactory position.  Cardiac enlargement. Electronically Signed   By: Lucienne Capers M.D.   On: 05/16/2021 19:30   DG Chest Port 1 View  Result Date: 05/16/2021 CLINICAL DATA:  Shortness of breath, chest pain EXAM: PORTABLE CHEST 1 VIEW COMPARISON:  Previous studies including the examination of 04/22/2021 FINDINGS: Transverse diameter of heart is increased. Central pulmonary vessels are prominent without signs of alveolar pulmonary edema.  There is no focal pulmonary consolidation. Left lateral CP angle is indistinct. There is questionable minimal blunting of right lateral CP angle. There is no pneumothorax. IMPRESSION: Cardiomegaly. Central pulmonary vessels are prominent suggesting mild CHF. There are no signs of alveolar pulmonary edema or focal pulmonary consolidation. Small bilateral pleural effusions, more so on the left side. Electronically Signed   By: Elmer Picker M.D.   On: 05/16/2021 12:11   DG Foot 2 Views Left  Result Date: 05/17/2021 CLINICAL DATA:  Pain and swelling EXAM: LEFT FOOT - 2 VIEW COMPARISON:  None. FINDINGS: No fracture or dislocation is seen. Extensive arterial calcifications are seen. Plantar spur is seen  in calcaneus. There is mild hallux valgus deformity. Cortical irregularity in the medial aspect of head of the first metatarsal may be residual from previous injury. IMPRESSION: No fracture or dislocation is seen. There are no focal lytic lesions. Plantar spur is seen in calcaneus. Electronically Signed   By: Elmer Picker M.D.   On: 05/17/2021 13:41   DG Foot 2 Views Right  Result Date: 05/17/2021 CLINICAL DATA:  Pain and swelling EXAM: RIGHT FOOT - 2 VIEW COMPARISON:  04/23/2021 FINDINGS: No fracture or dislocation is seen. Plantar spur is seen in calcaneus. Degenerative changes are noted in the intertarsal joints with bony spurs in the dorsal aspect. Extensive arterial calcifications are seen in the soft tissues. There is mild hallux valgus deformity. IMPRESSION: No fracture or dislocation is seen. There are no focal lytic lesions. Plantar spur is seen in calcaneus. Electronically Signed   By: Elmer Picker M.D.   On: 05/17/2021 13:43   IR PTA VENOUS EXCEPT DIALYSIS CIRCUIT  Result Date: 05/13/2021 INDICATION: 51 year old female with a history of end-stage renal disease on hemodialysis via a left IJ 28 cm palindrome hemodialysis catheter. This catheter was last exchanged on 02/20/2021 at  which time balloon disruption of an underlying fibrin sheath was performed. The catheter then worked well until recently when she began experience positional dependence and poor flows during hemodialysis. She presents for catheter evaluation with possible repeat balloon disruption and replacement. Of note, in the future the patient would like to switch to a right sided hemodialysis catheter. However, she was not prepared for moderate sedation today. EXAM: IR RIGHT FLUORO GUIDE CV LINE; IR PTA VENOUS EXCEPT DIALYSIS CIRCUIT MEDICATIONS: None. ANESTHESIA/SEDATION: None. FLUOROSCOPY TIME:  145 mGy air kerma COMPLICATIONS: None immediate. PROCEDURE: Informed written consent was obtained from the patient after a thorough discussion of the procedural risks, benefits and alternatives. All questions were addressed. Maximal Sterile Barrier Technique was utilized including caps, mask, sterile gowns, sterile gloves, sterile drape, hand hygiene and skin antiseptic. A timeout was performed prior to the initiation of the procedure. Initial spot fluoroscopic imaging was performed. The 28 cm palindrome tunneled hemodialysis catheter is relatively well position with the catheter tip overlying the upper right atrium. The venous port flushes and aspirates easily. The arterial port could not be aspirated. Contrast injection was first performed through the venous port. This demonstrates normal filling of the right atrium. Local anesthesia was attained by infiltration with 1% lidocaine. The retention cuff was released from the surrounding soft tissues using sharp dissection. A stiff glidewire was advanced through the venous lumen, through the right heart and into the inferior vena cava. As the catheter was manipulated, aspiration was maintained on the arterial port in till there was successful aspiration of the indwelling heparin and blood. At this time, contrast was injected through the arterial lumen under digital subtraction  angiography. There is clear evidence of a fibrin sheath along the distal aspect of the catheter. This is likely the underlying source of catheter malfunction. The catheter was removed. A 16 mm atlas balloon was advanced over the wire and positioned in the region of the fibrin sheath. The balloon was then inflated to full effacement disrupting the fibrin sheath. The balloon was removed. A new 28 cm palindrome tunneled hemodialysis catheter was advanced over the wire and position with the tip in the right mid atrium. Both the arterial and venous lumens flush and aspirate easily. The catheter was flushed with saline in the lumens locked with 2.1 mL heparin. The  catheter was capped and secured to the skin with 0 Prolene suture. Sterile bandages were applied. The patient tolerated the procedure well. IMPRESSION: 1. Fibrin sheath partially obstructing flow through the arterial lumen. 2. Successful balloon disruption of fibrin sheath. 3. Successful exchange for a new 28 cm palindrome tunneled hemodialysis catheter. Catheter tips are in the right mid atrium and the device is ready for immediate use. Electronically Signed   By: Jacqulynn Cadet M.D.   On: 05/13/2021 13:09   ECHOCARDIOGRAM COMPLETE  Result Date: 05/17/2021    ECHOCARDIOGRAM REPORT   Patient Name:   Courtney Gray Date of Exam: 05/17/2021 Medical Rec #:  [id number]       Height:       65.0 in Accession #:    [id number]      Weight:       375.2 lb Date of Birth:  [birth year]        BSA:          2.585 m Patient Age:    69 years        BP:           127/83 mmHg Patient Gender: F               HR:           70 bpm. Exam Location:  Inpatient Procedure: 2D Echo, Cardiac Doppler, Color Doppler and Intracardiac            Opacification Agent Indications:    NSTEMI  History:        Patient has prior history of Echocardiogram examinations, most                 recent 04/23/2021. Risk Factors:Hypertension, Diabetes and                 Dyslipidemia. ESRD.  Sonographer:     Clayton Lefort RDCS (AE) Referring Phys: [id number] Leanne Chang [facility]  Sonographer Comments: Patient is morbidly obese. Image acquisition challenging due to patient body habitus. IMPRESSIONS  1. Left ventricular ejection fraction, by estimation, is 55 to 60%. The left ventricle has normal function. The left ventricle has no regional wall motion abnormalities. There is moderate concentric left ventricular hypertrophy. Left ventricular diastolic parameters are consistent with Grade II diastolic dysfunction (pseudonormalization). Elevated left atrial pressure.  2. Right ventricular systolic function is normal. The right ventricular size is normal. Tricuspid regurgitation signal is inadequate for assessing PA pressure.  3. Left atrial size was mildly dilated.  4. The mitral valve is degenerative. Mild mitral valve regurgitation. No evidence of mitral stenosis. The mean mitral valve gradient is 3.0 mmHg with average heart rate of 68 bpm. Moderate to severe mitral annular calcification.  5. The aortic valve is tricuspid. There is moderate calcification of the aortic valve. There is moderate thickening of the aortic valve. Aortic valve regurgitation is not visualized. Mild aortic valve stenosis. Aortic valve mean gradient measures 13.0 mmHg. Aortic valve Vmax measures 2.45 m/s. Comparison(s): Prior images reviewed side by side. There is now mild aortic stenosis. FINDINGS  Left Ventricle: Left ventricular ejection fraction, by estimation, is 55 to 60%. The left ventricle has normal function. The left ventricle has no regional wall motion abnormalities. Definity contrast agent was given IV to delineate the left ventricular  endocardial borders. The left ventricular internal cavity size was normal in size. There is moderate concentric left ventricular hypertrophy. Left ventricular diastolic parameters are consistent with Grade II diastolic dysfunction (pseudonormalization).  Elevated left  atrial pressure. Right Ventricle: The  right ventricular size is normal. No increase in right ventricular wall thickness. Right ventricular systolic function is normal. Tricuspid regurgitation signal is inadequate for assessing PA pressure. Left Atrium: Left atrial size was mildly dilated. Right Atrium: Right atrial size was normal in size. Pericardium: There is no evidence of pericardial effusion. Mitral Valve: The mitral valve is degenerative in appearance. Moderate to severe mitral annular calcification. Mild mitral valve regurgitation. No evidence of mitral valve stenosis. MV peak gradient, 6.7 mmHg. The mean mitral valve gradient is 3.0 mmHg with average heart rate of 68 bpm. Tricuspid Valve: The tricuspid valve is normal in structure. Tricuspid valve regurgitation is trivial. Aortic Valve: The aortic valve is tricuspid. There is moderate calcification of the aortic valve. There is moderate thickening of the aortic valve. Aortic valve regurgitation is not visualized. Mild aortic stenosis is present. Aortic valve mean gradient measures 13.0 mmHg. Aortic valve peak gradient measures 24.0 mmHg. Aortic valve area, by VTI measures 1.58 cm. Pulmonic Valve: The pulmonic valve was grossly normal. Pulmonic valve regurgitation is not visualized. Aorta: The aortic root is normal in size and structure. IAS/Shunts: No atrial level shunt detected by color flow Doppler.  LEFT VENTRICLE PLAX 2D LVIDd:         5.00 cm   Diastology LVIDs:         3.40 cm   LV e' medial:    5.70 cm/s LV PW:         1.60 cm   LV E/e' medial:  20.7 LV IVS:        1.80 cm   LV e' lateral:   7.58 cm/s LVOT diam:     2.16 cm   LV E/e' lateral: 15.6 LV SV:         81 LV SV Index:   31 LVOT Area:     3.66 cm  RIGHT VENTRICLE RV Basal diam:  3.30 cm RV S prime:     17.70 cm/s TAPSE (M-mode): 3.3 cm LEFT ATRIUM              Index        RIGHT ATRIUM           Index LA diam:        3.00 cm  1.16 cm/m   RA Area:     14.60 cm LA Vol (A2C):   104.0 ml 40.24 ml/m  RA Volume:   37.00 ml  14.32  ml/m LA Vol (A4C):   67.4 ml  26.08 ml/m LA Biplane Vol: 83.6 ml  32.35 ml/m  AORTIC VALVE AV Area (Vmax):    1.47 cm AV Area (Vmean):   1.41 cm AV Area (VTI):     1.58 cm AV Vmax:           245.00 cm/s AV Vmean:          168.000 cm/s AV VTI:            0.516 m AV Peak Grad:      24.0 mmHg AV Mean Grad:      13.0 mmHg LVOT Vmax:         98.50 cm/s LVOT Vmean:        64.600 cm/s LVOT VTI:          0.222 m LVOT/AV VTI ratio: 0.43  AORTA Ao Root diam: 3.30 cm Ao Asc diam:  3.40 cm MITRAL VALVE MV Area (PHT): 2.41 cm     SHUNTS MV  Peak grad:  6.7 mmHg     Systemic VTI:  0.22 m MV Mean grad:  3.0 mmHg     Systemic Diam: 2.16 cm MV Vmax:       1.29 m/s MV Vmean:      83.9 cm/s MV Decel Time: 315 msec MV E velocity: 118.00 cm/s MV A velocity: 108.00 cm/s MV E/A ratio:  1.09 Mihai Croitoru MD Electronically signed by Sanda Klein MD Signature Date/Time: 05/17/2021/1:29:46 PM    Final    [place] EKG SITE RITE  Result Date: 05/16/2021 If Site Rite image not attached, placement could not be confirmed due to current cardiac rhythm.   Labs:  CBC: Recent Labs    05/20/21 0618 05/21/21 0638 05/22/21 0606 05/23/21 0637  WBC 9.5 11.7* 11.6* 10.5  HGB 7.7* 7.9* 7.4* 7.7*  HCT 25.8* 26.7* 25.5* 26.1*  PLT 167 183 179 178    COAGS: Recent Labs    04/23/21 1214  APTT 40*    BMP: Recent Labs    05/21/21 0638 05/22/21 0606 05/23/21 0637 05/24/21 0456  NA 132* 134* 134* 132*  K 5.3* 6.2* 5.4* 4.7  CL 94* 96* 95* 95*  CO2 22 23 23 24   GLUCOSE 239* 206* 232* 227*  BUN 72* 84* 67* 52*  CALCIUM 9.1 8.8* 9.2 9.0  CREATININE 16.61* 18.74* 15.77* 12.75*  GFRNONAA 2* 2* 2* 3*    LIVER FUNCTION TESTS: Recent Labs    05/19/21 0404 05/20/21 0618 05/21/21 0638 05/22/21 0606 05/24/21 0456  BILITOT 0.4 0.5 0.4 0.5  --   AST 14* 15 15 13*  --   ALT 14 13 14 13   --   ALKPHOS 64 57 58 55  --   PROT 7.9 7.3 7.8 7.5  --   ALBUMIN 3.3* 3.1* 3.4* 3.2* 3.1*     Assessment and Plan:  51 y.o.  female inpatient. History of DM, HTN, ESRD via HD via a LIJ 28 cm palindrome catheter.  Presented to the ED  at [facility] on 2.9.23 with melena and persistent chest pain. Last exchanged on 2.6.23 with angioplasty and fibrin sheath removal.  Found to have sluggish return on the arterial line and low pressures.Marland Kitchen LIJ placed 6.2022 after line holiday. Per procedure note dated 2.6.23. Of note patient has a RIJ central line placed on 2.9.23. The patient would like a RIJ catheter during her next exchange. Patient presents for tunneled HD catheter exchange - placement and possible fibrin sheath removal.   Sodium 132, chloride 95, BUN 52, Cr 12.75, Albumin 3.1, GFR < 3. Chest xray from 2.9.23 shows LIJ tunneled HD catheter and a RIJ tunneled central line. Sodium 132, BUN 52, Cr 12.75, GFR < 3. Allergies include amxocillin, morphone, oxycodone. Patient has been NPO since midnight.Patient has been NPO since midnight.  Risks and benefits discussed with the patient including, but not limited to bleeding, infection, vascular injury, pneumothorax which may require chest tube placement, air embolism or even death.  All of the patient's questions were answered, patient is agreeable to proceed. Consent signed and in chart.   Thank you for this interesting consult.  I greatly enjoyed meeting Lucresia Simic and look forward to participating in their care.  A copy of this report was sent to the requesting provider on this date.  Electronically Signed: Jacqualine Mau, NP 05/24/2021, 9:47 AM   I spent a total of 40 Minutes    in face to face in clinical consultation, greater than 50% of which was counseling/coordinating  care for HD catheter exchange possible fibrin sheath removal.

## 2021-05-24 NOTE — Progress Notes (Signed)
D/C order noted. Contacted [facility] and spoke to Cougar, Therapist, sports. Clinic advised of pt's d/c today and that pt will resume care tomorrow. D/C summary faxed to clinic for continuation of care.   Melven Sartorius Renal Navigator [phone]

## 2021-05-24 NOTE — Progress Notes (Signed)
[facility] Progress Note   Subjective:   Pt seen after placement of a new R IJ TDC. She has a visitor and declines exam at present but I did speak to her daughter. They would like her dialysis time reduced, explained this will need to be determined by her outpatient nephrologist. Otherwise, no new concerns.   Objective Vitals:   05/24/21 1210 05/24/21 1215 05/24/21 1220 05/24/21 1235  BP: (!) 168/56 (!) 178/66 (!) 165/63 (!) 149/53  Pulse: 66 65 64 64  Resp: 16 14 15 14   Temp:      TempSrc:      SpO2: 100% 100% 100% 100%  Weight:      Height:       Physical Exam General: obese appearing female, alert and in NAD Full exam deferred  Additional Objective Labs: Basic Metabolic Panel: Recent Labs  Lab 05/21/21 0638 05/22/21 0606 05/23/21 0637 05/24/21 0456  NA 132* 134* 134* 132*  K 5.3* 6.2* 5.4* 4.7  CL 94* 96* 95* 95*  CO2 22 23 23 24   GLUCOSE 239* 206* 232* 227*  BUN 72* 84* 67* 52*  CREATININE 16.61* 18.74* 15.77* 12.75*  CALCIUM 9.1 8.8* 9.2 9.0  PHOS 9.1* 10.6*  --  8.0*   Liver Function Tests: Recent Labs  Lab 05/20/21 0618 05/21/21 0638 05/22/21 0606 05/24/21 0456  AST 15 15 13*  --   ALT 13 14 13   --   ALKPHOS 57 58 55  --   BILITOT 0.5 0.4 0.5  --   PROT 7.3 7.8 7.5  --   ALBUMIN 3.1* 3.4* 3.2* 3.1*   No results for input(s): LIPASE, AMYLASE in the last 168 hours. CBC: Recent Labs  Lab 05/19/21 0404 05/20/21 0618 05/21/21 2094 05/22/21 0606 05/23/21 0637  WBC 11.3* 9.5 11.7* 11.6* 10.5  NEUTROABS 8.5* 6.8 8.7* 8.7*  --   HGB 8.3* 7.7* 7.9* 7.4* 7.7*  HCT 28.1* 25.8* 26.7* 25.5* 26.1*  MCV 98.9 100.4* 98.9 100.4* 100.8*  PLT 176 167 183 179 178   Blood Culture    Component Value Date/Time   SDES BLOOD LEFT HAND 09/03/2020 2359   SPECREQUEST  09/03/2020 2359    BOTTLES DRAWN AEROBIC AND ANAEROBIC Blood Culture adequate volume   CULT  09/03/2020 2359    NO GROWTH 5 DAYS Performed at [facility], [address],  [place], [place] [address]    REPTSTATUS 09/09/2020 FINAL 09/03/2020 2359    Cardiac Enzymes: No results for input(s): CKTOTAL, CKMB, CKMBINDEX, TROPONINI in the last 168 hours. CBG: Recent Labs  Lab 05/23/21 0743 05/23/21 1142 05/23/21 1646 05/23/21 2055 05/24/21 0759  GLUCAP 208* 164* 138* 150* 166*   Iron Studies: No results for input(s): IRON, TIBC, TRANSFERRIN, FERRITIN in the last 72 hours. @lablastinr3 @ Studies/Results: No results found. Medications:  sodium chloride     sodium chloride     sodium chloride 10 mL/hr at 05/21/21 0624   ferric gluconate (FERRLECIT) IVPB 62.5 mg (05/23/21 1340)   sodium chloride      aspirin  81 mg Oral Daily   atorvastatin  10 mg Oral QHS   calcitRIOL  1 mcg Oral Q T,Th,Sa-HD   calcium carbonate  1,500 mg Oral TID WC   carvedilol  25 mg Oral 2 times per day on Sun Mon Wed Fri   Chlorhexidine Gluconate Cloth  6 each Topical Daily   fentaNYL       gabapentin  100 mg Oral Q12H   hydrocortisone  25 mg  Rectal BID   insulin aspart  0-5 Units Subcutaneous QHS   insulin aspart  0-6 Units Subcutaneous TID WC   lidocaine-EPINEPHrine       midazolam       pantoprazole  40 mg Oral Daily   sevelamer carbonate  800 mg Oral TID WC   sodium chloride flush  3 mL Intravenous Q12H   sodium chloride flush  3 mL Intravenous Q12H   tiZANidine  4 mg Oral BID    Dialysis Orders: TTS [name]  4h 57min   400/500  165kg  1K/2.5Ca bath  TDC /(not using AVF) Hep 2600 + 1500 unit /hr  - rocaltrol 1.0 ug tiw  - mircera 200 ug q 2 wks, last 2/03  - venofer 50 mg weekly       CXR 2/09 - borderline edema  Assessment/Plan: NSTEMI - per cardiology, planned for outpatient follow up DOE - due to vol overload, had early edema by CXR and was up 5kg on admission. Now close to EDW, continue UF as tolerated.  HypOtension - imdur dc' and got NS bolus due to low BP in the 80's over the weekend. BP now slightly elevated.  Midodrine held.  Should improve post HD.   Coreg resumed per cardiology.  ESRD - on HD TTS.  TDC issues this admission, cath flo dwell overnight and still poorly functional so had a new R IJ TDC placed today.Anticoagulation has been on hold due to hematochezia.  DM2 - per pmd Anemia ckd, hematochezia - next esa due on 2/17. Cont weekly IV fe. GI following, hgb 7.7 MBD ckd - Ca in range, phos high. Cont vdra, on tums tid ac, started renvela here Nutrition - renal diet w/fluid restrictions.     Anice Paganini, PA-C 05/24/2021, 1:00 PM  [facility] Pager: [phone]

## 2021-05-24 NOTE — TOC Transition Note (Signed)
Transition of Care [facility]) - CM/SW Discharge Note   Patient Details  Name: Courtney Gray MRN: [id number] Date of Birth: [birth year]  Transition of Care [facility]) CM/SW Contact:  Bethena Roys, RN Phone Number: 05/24/2021, 10:24 AM   Clinical Narrative:  Case Manager did speak with the patient regarding disposition needs. Patient is aware that Alvis Lemmings will visit for PT needs. Patient discussed that she has emotions regarding her heart attack. Patient voices that she would benefit from a therapist or counselor. Case Manager did discuss palliative care with the patient and she declines services at this time. Case Manager did state that she could call her insurance to see if which therapist in [facility] are in network. Patient did ask if the MD could write a note for railings for support at her apartment- MD is willing to do so. No further needs identified from Case Manager at this time.   Final next level of care: [place] Barriers to Discharge: No Barriers Identified   Patient Goals and CMS Choice Patient states their goals for this hospitalization and ongoing recovery are:: to return home CMS Medicare.gov Compare Post Acute Care list provided to:: Patient Choice offered to / list presented to : Patient, Adult Children  Discharge Placement                       Discharge Plan and Services In-house Referral: NA Discharge Planning Services: CM Consult Post Acute Care Choice: Home Health          DME Arranged: N/A DME Agency: NA       HH Arranged: PT   Date HH Agency Contacted: 05/23/21 Time [place] Contacted: [phone]    Readmission Risk Interventions Readmission Risk Prevention Plan 05/23/2021  Transportation Screening Complete  Medication Review Press photographer) Complete  PCP or Specialist appointment within 3-5 days of discharge Complete  HRI or [place] Complete  SW Recovery Care/Counseling Consult Complete  [place] Not Applicable

## 2021-05-24 NOTE — Progress Notes (Signed)
Occupational Therapy Treatment Patient Details Name: Courtney Gray MRN: [id number] DOB: 1970/09/15 Today's Date: 05/24/2021   History of present illness 51 yo admitted 2/9 with chest pain and rectal bleed. Pt with NSTEMI. PMhx: ESRD on HD TTS, DM, HTN, HLD, IBS, neuropathy, obesity   OT comments  Began education on compensatory strategies for ADL and use of AE. Session limited by pt going to procedure. Will attempt to follow up after procedure as schedule allows. Discussed recommendation for HHOT and pt stated she misunderstood and wants HHOT.    Recommendations for follow up therapy are one component of a multi-disciplinary discharge planning process, led by the attending physician.  Recommendations may be updated based on patient status, additional functional criteria and insurance authorization.    Follow Up Recommendations  Home health OT    Assistance Recommended at Discharge Intermittent Supervision/Assistance  Patient can return home with the following  A little help with walking and/or transfers;A lot of help with bathing/dressing/bathroom;Assistance with cooking/housework;Assist for transportation;Help with stairs or ramp for entrance   Equipment Recommendations  None recommended by OT    Recommendations for Other Services      Precautions / Restrictions Precautions Precautions: Fall Restrictions Weight Bearing Restrictions: No       Mobility Bed Mobility Overal bed mobility: Modified Independent                  Transfers Overall transfer level: Modified independent                       Balance                                           ADL either performed or assessed with clinical judgement   ADL Overall ADL's : Needs assistance/impaired                                       General ADL Comments: Educated pt on use fo AE for LB ADL wtih use of reacher adn sock aid; demonstrated use of sponge for bathing;  began educaiton on use of toilet tongs however transport in to take pt; daughter present for education    Extremity/Trunk Assessment Upper Extremity Assessment Upper Extremity Assessment: Overall WFL for tasks assessed   Lower Extremity Assessment Lower Extremity Assessment: Defer to PT evaluation        Vision       Perception     Praxis      Cognition Arousal/Alertness: Awake/alert Behavior During Therapy: WFL for tasks assessed/performed Overall Cognitive Status: Within Functional Limits for tasks assessed                                          Exercises      Shoulder Instructions       General Comments      Pertinent Vitals/ Pain       Pain Assessment Pain Assessment: Faces Faces Pain Scale: Hurts little more Pain Location: bil feet Pain Descriptors / Indicators: Aching Pain Intervention(s): Limited activity within patient's tolerance  Home Living  Prior Functioning/Environment              Frequency  Min 3X/week        Progress Toward Goals  OT Goals(current goals can now be found in the care plan section)  Progress towards OT goals: Progressing toward goals  Acute Rehab OT Goals Patient Stated Goal: to be more independent OT Goal Formulation: With patient Time For Goal Achievement: 06/07/21 Potential to Achieve Goals: Good ADL Goals Pt Will Perform Lower Body Bathing: with set-up;with supervision;sit to/from stand;with adaptive equipment Pt Will Perform Lower Body Dressing: with set-up;with supervision;with adaptive equipment;sit to/from stand Pt Will Perform Toileting - Clothing Manipulation and hygiene: with set-up;with supervision;sit to/from stand;sitting/lateral leans;with adaptive equipment Additional ADL Goal #1: Pt will independently verbalize 3 energy conservation strategies  Plan Discharge plan remains appropriate    Co-evaluation                  AM-PAC OT "6 Clicks" Daily Activity     Outcome Measure   Help from another person eating meals?: None Help from another person taking care of personal grooming?: None Help from another person toileting, which includes using toliet, bedpan, or urinal?: A Lot Help from another person bathing (including washing, rinsing, drying)?: A Lot Help from another person to put on and taking off regular upper body clothing?: A Little Help from another person to put on and taking off regular lower body clothing?: A Lot 6 Click Score: 17    End of Session    OT Visit Diagnosis: Muscle weakness (generalized) (M62.81);Pain Pain - part of body:  (feet)   Activity Tolerance Other (comment) (treatment limited by pt going for procedure)   Patient Left in bed;with call bell/phone within reach;with family/visitor present   Nurse Communication Other (comment) (DC needs)        Time: 1010-1026 OT Time Calculation (min): 16 min  Charges: OT General Charges $OT Visit: 1 Visit OT Treatments $Self Care/Home Management : 8-22 mins  Maurie Boettcher, OT/L   Acute OT Clinical Specialist [place] Pager [phone] Office [phone]   [facility] 05/24/2021, 11:15 AM

## 2021-05-24 NOTE — TOC Transition Note (Signed)
Transition of Care [facility]) - CM/SW Discharge Note   Patient Details  Name: Courtney Gray MRN: [id number] Date of Birth: [birth year]  Transition [facility]) CM/SW Contact:  Milas Gain, [place] Phone Number: 05/24/2021, 2:42 PM   Clinical Narrative:     Patient will DC to: Home address-[address] [address]  Anticipated DC date: 05/24/2021  Family notified: Antoinette   Transport by: Larence Penning transport/uber  ?  Per MD patient ready for DC to home . RN, patient, patient's family, notified of DC. Antoinette informed CSW she can assist with helping patient get in and out of car. Melburn Popper transport requested for patient.  CSW signing off.   Final next level of care: [place] Barriers to Discharge: No Barriers Identified   Patient Goals and CMS Choice Patient states their goals for this hospitalization and ongoing recovery are:: to return home CMS Medicare.gov Compare Post Acute Care list provided to:: Patient Choice offered to / list presented to : Patient, Adult Children  Discharge Placement                       Discharge Plan and Services In-house Referral: NA Discharge Planning Services: CM Consult Post Acute Care Choice: Home Health          DME Arranged: N/A DME Agency: NA       HH Arranged: PT   Date HH Agency Contacted: 05/23/21 Time HH Agency Contacted: [phone]    Social Determinants of Health (SDOH) Interventions     Readmission Risk Interventions Readmission Risk Prevention Plan 05/23/2021  Transportation Screening Complete  Medication Review Press photographer) Complete  PCP or Specialist appointment within 3-5 days of discharge Complete  HRI or [place] Complete  SW Recovery Care/Counseling Consult Complete  [place] Not Applicable

## 2021-05-24 NOTE — Progress Notes (Signed)
Patient has significant anxiety and stress around current admission for chest pain/NSTEMI. During her admission she expresses that stress continues to trigger chest pain and she has worry of that being serious and triggering another heart attack. Worries about what to do at home and asked about resources to manage her stress. Ordered Transition of Care. Encouraged patient and daughter to continue asking all the questions they need during rounds.

## 2021-05-24 NOTE — Progress Notes (Signed)
Occupational Therapy Treatment Note  Completed education regarding use of AE to increase independence with LB ADL and pericare. Also educated on use of Interdry to decrease MASD in skin folds. Recommend follow up [place].    05/24/21 1500  OT Visit Information  Last OT Received On 05/24/21  Assistance Needed +1  History of Present Illness 51 yo admitted 2/9 with chest pain and rectal bleed. Pt with NSTEMI. PMhx: ESRD on HD TTS, DM, HTN, HLD, IBS, neuropathy, obesity  Precautions  Precautions Fall  Pain Assessment  Pain Assessment Faces  Faces Pain Scale 2  Pain Location bil feet  Pain Descriptors / Indicators Aching  Pain Intervention(s) Limited activity within patient's tolerance  Cognition  Arousal/Alertness Awake/alert  Behavior During Therapy WFL for tasks assessed/performed  Overall Cognitive Status Within Functional Limits for tasks assessed  ADL  General ADL Comments Educated pt/daughter on AE (reacher, long handled sponge, wide sock aide and toilet tong) to help with LB ADL and pericare. Independence significantly improved with use of AE. Also educated pt/daughter on use of Interdry in skin folds to reduce MASD - verbalized understanding.  OT - End of Session  Activity Tolerance Patient tolerated treatment well  Patient left in bed  Nurse Communication Other (comment) (DC needs)  OT Assessment/Plan  OT Plan Other (comment) (further needs to be addressed byt HHOT)  Pain - part of body  (feet)  Follow Up Recommendations Home health OT  Assistance recommended at discharge Intermittent Supervision/Assistance  Patient can return home with the following A little help with walking and/or transfers;A lot of help with bathing/dressing/bathroom;Assistance with cooking/housework;Assist for transportation;Help with stairs or ramp for entrance  OT Equipment None recommended by OT  AM-PAC OT "6 Clicks" Daily Activity Outcome Measure (Version 2)  Help from another person eating meals? 4   Help from another person taking care of personal grooming? 4  Help from another person toileting, which includes using toliet, bedpan, or urinal? 3  Help from another person bathing (including washing, rinsing, drying)? 2  Help from another person to put on and taking off regular upper body clothing? 3  Help from another person to put on and taking off regular lower body clothing? 3  6 Click Score 19  OT Goal Progression  Progress towards OT goals Progressing toward goals  Acute Rehab OT Goals  Patient Stated Goal to be less dependent on her daughter  OT Goal Formulation With patient  Time For Goal Achievement 06/07/21  Potential to Achieve Goals Good  ADL Goals  Pt Will Perform Lower Body Bathing with set-up;with supervision;sit to/from stand;with adaptive equipment  Pt Will Perform Lower Body Dressing with set-up;with supervision;with adaptive equipment;sit to/from stand  Pt Will Perform Toileting - Clothing Manipulation and hygiene with set-up;with supervision;sit to/from stand;sitting/lateral leans;with adaptive equipment  Additional ADL Goal #1 Pt will independently verbalize 3 energy conservation strategies  OT Time Calculation  OT Start Time (ACUTE ONLY) 1305  OT Stop Time (ACUTE ONLY) 1326  OT Time Calculation (min) 21 min  OT General Charges  $OT Visit 1 Visit  OT Treatments  $Self Care/Home Management  8-22 mins   Maurie Boettcher, OT/L   Acute OT Clinical Specialist [place] Pager [phone] Office [phone]

## 2021-05-24 NOTE — Progress Notes (Signed)
PT Cancellation Note  Patient Details Name: Courtney Gray MRN: [id number] DOB: 1970-06-28   Cancelled Treatment:    Reason Eval/Treat Not Completed: Patient at procedure or test/unavailable (pt getting ready for transport for procedure. Pt declined need to practice step today before D/C)   Asra Gambrel B [name] 05/24/2021, 10:29 AM Bayard Males, PT Acute Rehabilitation Services Pager: [phone] Office: [phone]

## 2021-05-25 DIAGNOSIS — Z992 Dependence on renal dialysis: Secondary | ICD-10-CM | POA: Diagnosis not present

## 2021-05-25 DIAGNOSIS — N186 End stage renal disease: Secondary | ICD-10-CM | POA: Diagnosis not present

## 2021-05-28 DIAGNOSIS — N186 End stage renal disease: Secondary | ICD-10-CM | POA: Diagnosis not present

## 2021-05-28 DIAGNOSIS — Z992 Dependence on renal dialysis: Secondary | ICD-10-CM | POA: Diagnosis not present

## 2021-05-29 DIAGNOSIS — K649 Unspecified hemorrhoids: Secondary | ICD-10-CM | POA: Diagnosis not present

## 2021-05-29 DIAGNOSIS — Z789 Other specified health status: Secondary | ICD-10-CM | POA: Diagnosis not present

## 2021-05-29 DIAGNOSIS — I2510 Atherosclerotic heart disease of native coronary artery without angina pectoris: Secondary | ICD-10-CM | POA: Diagnosis not present

## 2021-05-29 DIAGNOSIS — Z992 Dependence on renal dialysis: Secondary | ICD-10-CM | POA: Diagnosis not present

## 2021-05-29 DIAGNOSIS — M79606 Pain in leg, unspecified: Secondary | ICD-10-CM | POA: Diagnosis not present

## 2021-05-29 DIAGNOSIS — Z299 Encounter for prophylactic measures, unspecified: Secondary | ICD-10-CM | POA: Diagnosis not present

## 2021-05-30 DIAGNOSIS — N186 End stage renal disease: Secondary | ICD-10-CM | POA: Diagnosis not present

## 2021-05-30 DIAGNOSIS — Z992 Dependence on renal dialysis: Secondary | ICD-10-CM | POA: Diagnosis not present

## 2021-05-31 DIAGNOSIS — D631 Anemia in chronic kidney disease: Secondary | ICD-10-CM | POA: Diagnosis not present

## 2021-05-31 DIAGNOSIS — I1311 Hypertensive heart and chronic kidney disease without heart failure, with stage 5 chronic kidney disease, or end stage renal disease: Secondary | ICD-10-CM | POA: Diagnosis not present

## 2021-05-31 DIAGNOSIS — E1122 Type 2 diabetes mellitus with diabetic chronic kidney disease: Secondary | ICD-10-CM | POA: Diagnosis not present

## 2021-05-31 DIAGNOSIS — J90 Pleural effusion, not elsewhere classified: Secondary | ICD-10-CM | POA: Diagnosis not present

## 2021-05-31 DIAGNOSIS — I700 Atherosclerosis of aorta: Secondary | ICD-10-CM | POA: Diagnosis not present

## 2021-05-31 DIAGNOSIS — Z9181 History of falling: Secondary | ICD-10-CM | POA: Diagnosis not present

## 2021-05-31 DIAGNOSIS — K581 Irritable bowel syndrome with constipation: Secondary | ICD-10-CM | POA: Diagnosis not present

## 2021-05-31 DIAGNOSIS — J9611 Chronic respiratory failure with hypoxia: Secondary | ICD-10-CM | POA: Diagnosis not present

## 2021-05-31 DIAGNOSIS — Z7982 Long term (current) use of aspirin: Secondary | ICD-10-CM | POA: Diagnosis not present

## 2021-05-31 DIAGNOSIS — Z992 Dependence on renal dialysis: Secondary | ICD-10-CM | POA: Diagnosis not present

## 2021-05-31 DIAGNOSIS — N186 End stage renal disease: Secondary | ICD-10-CM | POA: Diagnosis not present

## 2021-05-31 DIAGNOSIS — Z794 Long term (current) use of insulin: Secondary | ICD-10-CM | POA: Diagnosis not present

## 2021-05-31 DIAGNOSIS — I214 Non-ST elevation (NSTEMI) myocardial infarction: Secondary | ICD-10-CM | POA: Diagnosis not present

## 2021-05-31 DIAGNOSIS — Z7984 Long term (current) use of oral hypoglycemic drugs: Secondary | ICD-10-CM | POA: Diagnosis not present

## 2021-05-31 DIAGNOSIS — E785 Hyperlipidemia, unspecified: Secondary | ICD-10-CM | POA: Diagnosis not present

## 2021-05-31 DIAGNOSIS — I083 Combined rheumatic disorders of mitral, aortic and tricuspid valves: Secondary | ICD-10-CM | POA: Diagnosis not present

## 2021-05-31 DIAGNOSIS — Z9981 Dependence on supplemental oxygen: Secondary | ICD-10-CM | POA: Diagnosis not present

## 2021-05-31 DIAGNOSIS — I2510 Atherosclerotic heart disease of native coronary artery without angina pectoris: Secondary | ICD-10-CM | POA: Diagnosis not present

## 2021-05-31 DIAGNOSIS — E1140 Type 2 diabetes mellitus with diabetic neuropathy, unspecified: Secondary | ICD-10-CM | POA: Diagnosis not present

## 2021-06-01 DIAGNOSIS — Z992 Dependence on renal dialysis: Secondary | ICD-10-CM | POA: Diagnosis not present

## 2021-06-01 DIAGNOSIS — N186 End stage renal disease: Secondary | ICD-10-CM | POA: Diagnosis not present

## 2021-06-03 DIAGNOSIS — K581 Irritable bowel syndrome with constipation: Secondary | ICD-10-CM | POA: Diagnosis not present

## 2021-06-03 DIAGNOSIS — I1311 Hypertensive heart and chronic kidney disease without heart failure, with stage 5 chronic kidney disease, or end stage renal disease: Secondary | ICD-10-CM | POA: Diagnosis not present

## 2021-06-03 DIAGNOSIS — J90 Pleural effusion, not elsewhere classified: Secondary | ICD-10-CM | POA: Diagnosis not present

## 2021-06-03 DIAGNOSIS — Z992 Dependence on renal dialysis: Secondary | ICD-10-CM | POA: Diagnosis not present

## 2021-06-03 DIAGNOSIS — E1140 Type 2 diabetes mellitus with diabetic neuropathy, unspecified: Secondary | ICD-10-CM | POA: Diagnosis not present

## 2021-06-03 DIAGNOSIS — E1122 Type 2 diabetes mellitus with diabetic chronic kidney disease: Secondary | ICD-10-CM | POA: Diagnosis not present

## 2021-06-03 DIAGNOSIS — I700 Atherosclerosis of aorta: Secondary | ICD-10-CM | POA: Diagnosis not present

## 2021-06-03 DIAGNOSIS — J9611 Chronic respiratory failure with hypoxia: Secondary | ICD-10-CM | POA: Diagnosis not present

## 2021-06-03 DIAGNOSIS — Z9181 History of falling: Secondary | ICD-10-CM | POA: Diagnosis not present

## 2021-06-03 DIAGNOSIS — Z7984 Long term (current) use of oral hypoglycemic drugs: Secondary | ICD-10-CM | POA: Diagnosis not present

## 2021-06-03 DIAGNOSIS — N186 End stage renal disease: Secondary | ICD-10-CM | POA: Diagnosis not present

## 2021-06-03 DIAGNOSIS — D631 Anemia in chronic kidney disease: Secondary | ICD-10-CM | POA: Diagnosis not present

## 2021-06-03 DIAGNOSIS — E785 Hyperlipidemia, unspecified: Secondary | ICD-10-CM | POA: Diagnosis not present

## 2021-06-03 DIAGNOSIS — Z7982 Long term (current) use of aspirin: Secondary | ICD-10-CM | POA: Diagnosis not present

## 2021-06-03 DIAGNOSIS — Z9981 Dependence on supplemental oxygen: Secondary | ICD-10-CM | POA: Diagnosis not present

## 2021-06-03 DIAGNOSIS — Z794 Long term (current) use of insulin: Secondary | ICD-10-CM | POA: Diagnosis not present

## 2021-06-03 DIAGNOSIS — I083 Combined rheumatic disorders of mitral, aortic and tricuspid valves: Secondary | ICD-10-CM | POA: Diagnosis not present

## 2021-06-03 DIAGNOSIS — I2510 Atherosclerotic heart disease of native coronary artery without angina pectoris: Secondary | ICD-10-CM | POA: Diagnosis not present

## 2021-06-03 DIAGNOSIS — I214 Non-ST elevation (NSTEMI) myocardial infarction: Secondary | ICD-10-CM | POA: Diagnosis not present

## 2021-06-04 DIAGNOSIS — E7800 Pure hypercholesterolemia, unspecified: Secondary | ICD-10-CM | POA: Diagnosis not present

## 2021-06-04 DIAGNOSIS — N186 End stage renal disease: Secondary | ICD-10-CM | POA: Diagnosis not present

## 2021-06-04 DIAGNOSIS — I10 Essential (primary) hypertension: Secondary | ICD-10-CM | POA: Diagnosis not present

## 2021-06-04 DIAGNOSIS — Z992 Dependence on renal dialysis: Secondary | ICD-10-CM | POA: Diagnosis not present

## 2021-06-06 DIAGNOSIS — N186 End stage renal disease: Secondary | ICD-10-CM | POA: Diagnosis not present

## 2021-06-06 DIAGNOSIS — Z992 Dependence on renal dialysis: Secondary | ICD-10-CM | POA: Diagnosis not present

## 2021-06-06 DIAGNOSIS — E8779 Other fluid overload: Secondary | ICD-10-CM | POA: Diagnosis not present

## 2021-06-06 DIAGNOSIS — Z7689 Persons encountering health services in other specified circumstances: Secondary | ICD-10-CM | POA: Diagnosis not present

## 2021-06-07 DIAGNOSIS — M79672 Pain in left foot: Secondary | ICD-10-CM | POA: Diagnosis not present

## 2021-06-07 DIAGNOSIS — Z7689 Persons encountering health services in other specified circumstances: Secondary | ICD-10-CM | POA: Diagnosis not present

## 2021-06-07 DIAGNOSIS — M79671 Pain in right foot: Secondary | ICD-10-CM | POA: Diagnosis not present

## 2021-06-07 DIAGNOSIS — M109 Gout, unspecified: Secondary | ICD-10-CM | POA: Diagnosis not present

## 2021-06-08 DIAGNOSIS — N186 End stage renal disease: Secondary | ICD-10-CM | POA: Diagnosis not present

## 2021-06-08 DIAGNOSIS — E8779 Other fluid overload: Secondary | ICD-10-CM | POA: Diagnosis not present

## 2021-06-08 DIAGNOSIS — Z992 Dependence on renal dialysis: Secondary | ICD-10-CM | POA: Diagnosis not present

## 2021-06-10 ENCOUNTER — Inpatient Hospital Stay (HOSPITAL_COMMUNITY)
Admission: EM | Admit: 2021-06-10 | Discharge: 2021-06-12 | DRG: 640 | Disposition: A | Discharge status: Home / Self Care | Payer: Medicare Other | Attending: Internal Medicine | Admitting: Internal Medicine

## 2021-06-10 ENCOUNTER — Other Ambulatory Visit: Payer: Self-pay

## 2021-06-10 ENCOUNTER — Encounter (HOSPITAL_COMMUNITY): Payer: Self-pay | Admitting: Emergency Medicine

## 2021-06-10 ENCOUNTER — Emergency Department (HOSPITAL_COMMUNITY): Payer: Medicare Other

## 2021-06-10 DIAGNOSIS — Z794 Long term (current) use of insulin: Secondary | ICD-10-CM

## 2021-06-10 DIAGNOSIS — I517 Cardiomegaly: Secondary | ICD-10-CM | POA: Diagnosis not present

## 2021-06-10 DIAGNOSIS — Z6841 Body Mass Index (BMI) 40.0 and over, adult: Secondary | ICD-10-CM

## 2021-06-10 DIAGNOSIS — E7800 Pure hypercholesterolemia, unspecified: Secondary | ICD-10-CM | POA: Diagnosis present

## 2021-06-10 DIAGNOSIS — J80 Acute respiratory distress syndrome: Secondary | ICD-10-CM | POA: Diagnosis not present

## 2021-06-10 DIAGNOSIS — E785 Hyperlipidemia, unspecified: Secondary | ICD-10-CM | POA: Diagnosis present

## 2021-06-10 DIAGNOSIS — Z7984 Long term (current) use of oral hypoglycemic drugs: Secondary | ICD-10-CM

## 2021-06-10 DIAGNOSIS — Z20822 Contact with and (suspected) exposure to covid-19: Secondary | ICD-10-CM | POA: Diagnosis present

## 2021-06-10 DIAGNOSIS — N186 End stage renal disease: Secondary | ICD-10-CM | POA: Diagnosis not present

## 2021-06-10 DIAGNOSIS — Z713 Dietary counseling and surveillance: Secondary | ICD-10-CM

## 2021-06-10 DIAGNOSIS — Z7982 Long term (current) use of aspirin: Secondary | ICD-10-CM | POA: Diagnosis not present

## 2021-06-10 DIAGNOSIS — E871 Hypo-osmolality and hyponatremia: Secondary | ICD-10-CM | POA: Diagnosis not present

## 2021-06-10 DIAGNOSIS — E1169 Type 2 diabetes mellitus with other specified complication: Secondary | ICD-10-CM | POA: Diagnosis not present

## 2021-06-10 DIAGNOSIS — E875 Hyperkalemia: Secondary | ICD-10-CM | POA: Diagnosis present

## 2021-06-10 DIAGNOSIS — I120 Hypertensive chronic kidney disease with stage 5 chronic kidney disease or end stage renal disease: Secondary | ICD-10-CM | POA: Diagnosis not present

## 2021-06-10 DIAGNOSIS — I10 Essential (primary) hypertension: Secondary | ICD-10-CM | POA: Diagnosis not present

## 2021-06-10 DIAGNOSIS — E1122 Type 2 diabetes mellitus with diabetic chronic kidney disease: Secondary | ICD-10-CM | POA: Diagnosis present

## 2021-06-10 DIAGNOSIS — J810 Acute pulmonary edema: Secondary | ICD-10-CM | POA: Diagnosis not present

## 2021-06-10 DIAGNOSIS — D72829 Elevated white blood cell count, unspecified: Secondary | ICD-10-CM | POA: Diagnosis present

## 2021-06-10 DIAGNOSIS — Z833 Family history of diabetes mellitus: Secondary | ICD-10-CM

## 2021-06-10 DIAGNOSIS — Z743 Need for continuous supervision: Secondary | ICD-10-CM | POA: Diagnosis not present

## 2021-06-10 DIAGNOSIS — J9601 Acute respiratory failure with hypoxia: Secondary | ICD-10-CM | POA: Diagnosis not present

## 2021-06-10 DIAGNOSIS — E8770 Fluid overload, unspecified: Principal | ICD-10-CM | POA: Diagnosis present

## 2021-06-10 DIAGNOSIS — E1165 Type 2 diabetes mellitus with hyperglycemia: Secondary | ICD-10-CM | POA: Diagnosis present

## 2021-06-10 DIAGNOSIS — Z888 Allergy status to other drugs, medicaments and biological substances status: Secondary | ICD-10-CM

## 2021-06-10 DIAGNOSIS — Z5329 Procedure and treatment not carried out because of patient's decision for other reasons: Secondary | ICD-10-CM | POA: Diagnosis not present

## 2021-06-10 DIAGNOSIS — D631 Anemia in chronic kidney disease: Secondary | ICD-10-CM | POA: Diagnosis present

## 2021-06-10 DIAGNOSIS — Z992 Dependence on renal dialysis: Secondary | ICD-10-CM | POA: Diagnosis not present

## 2021-06-10 DIAGNOSIS — J9621 Acute and chronic respiratory failure with hypoxia: Secondary | ICD-10-CM | POA: Diagnosis not present

## 2021-06-10 DIAGNOSIS — Z9114 Patient's other noncompliance with medication regimen: Secondary | ICD-10-CM

## 2021-06-10 DIAGNOSIS — Z9981 Dependence on supplemental oxygen: Secondary | ICD-10-CM

## 2021-06-10 DIAGNOSIS — M898X9 Other specified disorders of bone, unspecified site: Secondary | ICD-10-CM | POA: Diagnosis not present

## 2021-06-10 DIAGNOSIS — M109 Gout, unspecified: Secondary | ICD-10-CM | POA: Diagnosis not present

## 2021-06-10 DIAGNOSIS — K5900 Constipation, unspecified: Secondary | ICD-10-CM | POA: Diagnosis present

## 2021-06-10 DIAGNOSIS — Z885 Allergy status to narcotic agent status: Secondary | ICD-10-CM

## 2021-06-10 DIAGNOSIS — Z79899 Other long term (current) drug therapy: Secondary | ICD-10-CM

## 2021-06-10 DIAGNOSIS — R0602 Shortness of breath: Secondary | ICD-10-CM | POA: Diagnosis not present

## 2021-06-10 DIAGNOSIS — R6889 Other general symptoms and signs: Secondary | ICD-10-CM | POA: Diagnosis not present

## 2021-06-10 DIAGNOSIS — I161 Hypertensive emergency: Secondary | ICD-10-CM | POA: Diagnosis present

## 2021-06-10 DIAGNOSIS — J90 Pleural effusion, not elsewhere classified: Secondary | ICD-10-CM | POA: Diagnosis not present

## 2021-06-10 LAB — CBC WITH DIFFERENTIAL/PLATELET
Abs Immature Granulocytes: 0.23 10*3/uL — ABNORMAL HIGH (ref 0.00–0.07)
Basophils Absolute: 0 10*3/uL (ref 0.0–0.1)
Basophils Relative: 0 %
Eosinophils Absolute: 0 10*3/uL (ref 0.0–0.5)
Eosinophils Relative: 0 %
HCT: 27.5 % — ABNORMAL LOW (ref 36.0–46.0)
Hemoglobin: 7.9 g/dL — ABNORMAL LOW (ref 12.0–15.0)
Immature Granulocytes: 1 %
Lymphocytes Relative: 4 %
Lymphs Abs: 0.7 10*3/uL (ref 0.7–4.0)
MCH: 29 pg (ref 26.0–34.0)
MCHC: 28.7 g/dL — ABNORMAL LOW (ref 30.0–36.0)
MCV: 101.1 fL — ABNORMAL HIGH (ref 80.0–100.0)
Monocytes Absolute: 0.7 10*3/uL (ref 0.1–1.0)
Monocytes Relative: 4 %
Neutro Abs: 17.2 10*3/uL — ABNORMAL HIGH (ref 1.7–7.7)
Neutrophils Relative %: 91 %
Platelets: 222 10*3/uL (ref 150–400)
RBC: 2.72 MIL/uL — ABNORMAL LOW (ref 3.87–5.11)
RDW: 15.9 % — ABNORMAL HIGH (ref 11.5–15.5)
WBC: 18.9 10*3/uL — ABNORMAL HIGH (ref 4.0–10.5)
nRBC: 0 % (ref 0.0–0.2)

## 2021-06-10 LAB — BASIC METABOLIC PANEL
Anion gap: 19 — ABNORMAL HIGH (ref 5–15)
BUN: 89 mg/dL — ABNORMAL HIGH (ref 6–20)
CO2: 20 mmol/L — ABNORMAL LOW (ref 22–32)
Calcium: 9 mg/dL (ref 8.9–10.3)
Chloride: 93 mmol/L — ABNORMAL LOW (ref 98–111)
Creatinine, Ser: 12.41 mg/dL — ABNORMAL HIGH (ref 0.44–1.00)
GFR, Estimated: 3 mL/min — ABNORMAL LOW (ref >60)
Glucose, Bld: 325 mg/dL — ABNORMAL HIGH (ref 70–99)
Potassium: 6.1 mmol/L — ABNORMAL HIGH (ref 3.5–5.1)
Sodium: 132 mmol/L — ABNORMAL LOW (ref 135–145)

## 2021-06-10 MED ORDER — NITROGLYCERIN IN D5W 200-5 MCG/ML-% IV SOLN: 50.0000 ug/min | INTRAVENOUS | Status: DC

## 2021-06-10 MED ORDER — INSULIN ASPART 100 UNIT/ML IV SOLN: 5.0000 [IU] | Freq: Once | INTRAVENOUS | Status: DC

## 2021-06-10 MED ORDER — SODIUM ZIRCONIUM CYCLOSILICATE 5 G PO PACK: 10.0000 g | PACK | Freq: Once | ORAL | Status: DC

## 2021-06-10 MED ORDER — DEXTROSE 50 % IV SOLN: 1.0000 | Freq: Once | INTRAVENOUS | Status: DC

## 2021-06-10 MED ORDER — CHLORHEXIDINE GLUCONATE CLOTH 2 % EX PADS: 6.0000 | MEDICATED_PAD | Freq: Every day | CUTANEOUS | Status: DC

## 2021-06-10 NOTE — ED Triage Notes (Signed)
Pt c/o sob started today. Pt did not finish her dialysis session Saturday due to foot pain.  ?

## 2021-06-10 NOTE — Progress Notes (Addendum)
Nephrology Brief note: ?Called by ER at [facility] hospital. ?ESRD TTS patient p/w sob, pulmonary edema, hypoxia and hyperkalemia. Recent hospital discharge. ? ?Plan to do HD tonight urgently. ?TDC, UF as tolerated. ?Discussed with dialysis nurse and EDP. ? ?OP HD orders as per recent nephrology note on 05/24/21 ?TTS [name] ? 4h 76min   400/500  165kg  1K/2.5Ca bath  TDC /(not using AVF) Hep 2600 + 1500 unit /hr ? - rocaltrol 1.0 ug tiw ? - mircera 200 ug q 2 wks, last 2/03 ? - venofer 50 mg weekly  ? ?Full consult to follow. ?Dr. Carolin Sicks. ?

## 2021-06-11 ENCOUNTER — Ambulatory Visit: Payer: Medicare Other | Admitting: Gastroenterology

## 2021-06-11 DIAGNOSIS — Z20822 Contact with and (suspected) exposure to covid-19: Secondary | ICD-10-CM | POA: Diagnosis present

## 2021-06-11 DIAGNOSIS — R0602 Shortness of breath: Secondary | ICD-10-CM | POA: Diagnosis not present

## 2021-06-11 DIAGNOSIS — J9601 Acute respiratory failure with hypoxia: Secondary | ICD-10-CM | POA: Diagnosis not present

## 2021-06-11 DIAGNOSIS — J9621 Acute and chronic respiratory failure with hypoxia: Secondary | ICD-10-CM | POA: Diagnosis not present

## 2021-06-11 DIAGNOSIS — Z794 Long term (current) use of insulin: Secondary | ICD-10-CM

## 2021-06-11 DIAGNOSIS — E785 Hyperlipidemia, unspecified: Secondary | ICD-10-CM | POA: Diagnosis not present

## 2021-06-11 DIAGNOSIS — Z992 Dependence on renal dialysis: Secondary | ICD-10-CM | POA: Diagnosis not present

## 2021-06-11 DIAGNOSIS — E1165 Type 2 diabetes mellitus with hyperglycemia: Secondary | ICD-10-CM | POA: Diagnosis present

## 2021-06-11 DIAGNOSIS — Z6841 Body Mass Index (BMI) 40.0 and over, adult: Secondary | ICD-10-CM | POA: Diagnosis not present

## 2021-06-11 DIAGNOSIS — I120 Hypertensive chronic kidney disease with stage 5 chronic kidney disease or end stage renal disease: Secondary | ICD-10-CM | POA: Diagnosis not present

## 2021-06-11 DIAGNOSIS — M898X9 Other specified disorders of bone, unspecified site: Secondary | ICD-10-CM | POA: Diagnosis present

## 2021-06-11 DIAGNOSIS — D72829 Elevated white blood cell count, unspecified: Secondary | ICD-10-CM | POA: Diagnosis present

## 2021-06-11 DIAGNOSIS — E8779 Other fluid overload: Secondary | ICD-10-CM | POA: Diagnosis not present

## 2021-06-11 DIAGNOSIS — Z7984 Long term (current) use of oral hypoglycemic drugs: Secondary | ICD-10-CM | POA: Diagnosis not present

## 2021-06-11 DIAGNOSIS — E1122 Type 2 diabetes mellitus with diabetic chronic kidney disease: Secondary | ICD-10-CM | POA: Diagnosis present

## 2021-06-11 DIAGNOSIS — Z79899 Other long term (current) drug therapy: Secondary | ICD-10-CM | POA: Diagnosis not present

## 2021-06-11 DIAGNOSIS — E8770 Fluid overload, unspecified: Secondary | ICD-10-CM | POA: Diagnosis present

## 2021-06-11 DIAGNOSIS — D631 Anemia in chronic kidney disease: Secondary | ICD-10-CM | POA: Diagnosis not present

## 2021-06-11 DIAGNOSIS — E1169 Type 2 diabetes mellitus with other specified complication: Secondary | ICD-10-CM | POA: Diagnosis not present

## 2021-06-11 DIAGNOSIS — N186 End stage renal disease: Secondary | ICD-10-CM | POA: Diagnosis not present

## 2021-06-11 DIAGNOSIS — Z7982 Long term (current) use of aspirin: Secondary | ICD-10-CM | POA: Diagnosis not present

## 2021-06-11 DIAGNOSIS — I161 Hypertensive emergency: Secondary | ICD-10-CM | POA: Diagnosis not present

## 2021-06-11 DIAGNOSIS — K5900 Constipation, unspecified: Secondary | ICD-10-CM | POA: Diagnosis present

## 2021-06-11 DIAGNOSIS — E871 Hypo-osmolality and hyponatremia: Secondary | ICD-10-CM | POA: Diagnosis present

## 2021-06-11 DIAGNOSIS — E875 Hyperkalemia: Secondary | ICD-10-CM | POA: Diagnosis not present

## 2021-06-11 DIAGNOSIS — E7800 Pure hypercholesterolemia, unspecified: Secondary | ICD-10-CM | POA: Diagnosis present

## 2021-06-11 DIAGNOSIS — M109 Gout, unspecified: Secondary | ICD-10-CM | POA: Diagnosis present

## 2021-06-11 DIAGNOSIS — J810 Acute pulmonary edema: Secondary | ICD-10-CM | POA: Diagnosis not present

## 2021-06-11 LAB — BLOOD GAS, VENOUS
Acid-Base Excess: 0.2 mmol/L (ref 0.0–2.0)
Bicarbonate: 26.7 mmol/L (ref 20.0–28.0)
Drawn by: 6381
FIO2: 36 %
O2 Saturation: 72.3 %
Patient temperature: 36.8
pCO2, Ven: 53 mmHg (ref 44–60)
pH, Ven: 7.31 (ref 7.25–7.43)
pO2, Ven: 45 mmHg (ref 32–45)

## 2021-06-11 LAB — COMPREHENSIVE METABOLIC PANEL
ALT: 14 U/L (ref 0–44)
AST: 14 U/L — ABNORMAL LOW (ref 15–41)
Albumin: 3.9 g/dL (ref 3.5–5.0)
Alkaline Phosphatase: 65 U/L (ref 38–126)
Anion gap: 15 (ref 5–15)
BUN: 44 mg/dL — ABNORMAL HIGH (ref 6–20)
CO2: 27 mmol/L (ref 22–32)
Calcium: 9.1 mg/dL (ref 8.9–10.3)
Chloride: 93 mmol/L — ABNORMAL LOW (ref 98–111)
Creatinine, Ser: 7.46 mg/dL — ABNORMAL HIGH (ref 0.44–1.00)
GFR, Estimated: 6 mL/min — ABNORMAL LOW (ref >60)
Glucose, Bld: 94 mg/dL (ref 70–99)
Potassium: 3.8 mmol/L (ref 3.5–5.1)
Sodium: 135 mmol/L (ref 135–145)
Total Bilirubin: 0.8 mg/dL (ref 0.3–1.2)
Total Protein: 8.8 g/dL — ABNORMAL HIGH (ref 6.5–8.1)

## 2021-06-11 LAB — CBC WITH DIFFERENTIAL/PLATELET
Abs Immature Granulocytes: 0.15 10*3/uL — ABNORMAL HIGH (ref 0.00–0.07)
Basophils Absolute: 0.1 10*3/uL (ref 0.0–0.1)
Basophils Relative: 0 %
Eosinophils Absolute: 0.1 10*3/uL (ref 0.0–0.5)
Eosinophils Relative: 1 %
HCT: 27.2 % — ABNORMAL LOW (ref 36.0–46.0)
Hemoglobin: 8 g/dL — ABNORMAL LOW (ref 12.0–15.0)
Immature Granulocytes: 1 %
Lymphocytes Relative: 9 %
Lymphs Abs: 1.5 10*3/uL (ref 0.7–4.0)
MCH: 29.1 pg (ref 26.0–34.0)
MCHC: 29.4 g/dL — ABNORMAL LOW (ref 30.0–36.0)
MCV: 98.9 fL (ref 80.0–100.0)
Monocytes Absolute: 1.2 10*3/uL — ABNORMAL HIGH (ref 0.1–1.0)
Monocytes Relative: 7 %
Neutro Abs: 14.3 10*3/uL — ABNORMAL HIGH (ref 1.7–7.7)
Neutrophils Relative %: 82 %
Platelets: 226 10*3/uL (ref 150–400)
RBC: 2.75 MIL/uL — ABNORMAL LOW (ref 3.87–5.11)
RDW: 16 % — ABNORMAL HIGH (ref 11.5–15.5)
WBC: 17.4 10*3/uL — ABNORMAL HIGH (ref 4.0–10.5)
nRBC: 0 % (ref 0.0–0.2)

## 2021-06-11 LAB — RESP PANEL BY RT-PCR (FLU A&B, COVID) ARPGX2
Influenza A by PCR: NEGATIVE
Influenza B by PCR: NEGATIVE
SARS Coronavirus 2 by RT PCR: NEGATIVE

## 2021-06-11 LAB — CBG MONITORING, ED
Glucose-Capillary: 251 mg/dL — ABNORMAL HIGH (ref 70–99)
Glucose-Capillary: 107 mg/dL — ABNORMAL HIGH (ref 70–99)
Glucose-Capillary: 87 mg/dL (ref 70–99)
Glucose-Capillary: 114 mg/dL — ABNORMAL HIGH (ref 70–99)
Glucose-Capillary: 105 mg/dL — ABNORMAL HIGH (ref 70–99)

## 2021-06-11 LAB — TSH: TSH: 3.935 u[IU]/mL (ref 0.350–4.500)

## 2021-06-11 LAB — MAGNESIUM: Magnesium: 2 mg/dL (ref 1.7–2.4)

## 2021-06-11 MED ORDER — ACETAMINOPHEN 650 MG RE SUPP: 650.0000 mg | Freq: Four times a day (QID) | RECTAL | Status: DC | PRN

## 2021-06-11 MED ORDER — INSULIN GLARGINE-YFGN 100 UNIT/ML ~~LOC~~ SOLN
8.0000 [IU] | Freq: Every day | SUBCUTANEOUS | Status: DC
Start: 2021-06-11 — End: 2021-06-12
  Administered 2021-06-11 – 2021-06-12 (×2): 8 [IU] via SUBCUTANEOUS
  Filled 2021-06-11 (×3): qty 0.08

## 2021-06-11 MED ORDER — HEPARIN SODIUM (PORCINE) 1000 UNIT/ML DIALYSIS
20.0000 [IU]/kg | INTRAMUSCULAR | Status: DC | PRN
  Administered 2021-06-11: 3300 [IU] via INTRAVENOUS_CENTRAL

## 2021-06-11 MED ORDER — ALTEPLASE 2 MG IJ SOLR
2.0000 mg | Freq: Once | INTRAMUSCULAR | Status: DC | PRN
  Filled 2021-06-11: qty 2

## 2021-06-11 MED ORDER — ASPIRIN EC 81 MG PO TBEC
81.0000 mg | DELAYED_RELEASE_TABLET | Freq: Every day | ORAL | Status: DC
  Administered 2021-06-11 – 2021-06-12 (×2): 81 mg via ORAL
  Filled 2021-06-11 (×2): qty 1

## 2021-06-11 MED ORDER — TIZANIDINE HCL 4 MG PO TABS
4.0000 mg | ORAL_TABLET | Freq: Three times a day (TID) | ORAL | Status: DC
Start: 2021-06-11 — End: 2021-06-12
  Administered 2021-06-11 – 2021-06-12 (×4): 4 mg via ORAL
  Filled 2021-06-11 (×4): qty 1

## 2021-06-11 MED ORDER — GABAPENTIN 100 MG PO CAPS
200.0000 mg | ORAL_CAPSULE | ORAL | Status: DC
  Administered 2021-06-11: 200 mg via ORAL

## 2021-06-11 MED ORDER — PREDNISONE 10 MG PO TABS
10.0000 mg | ORAL_TABLET | Freq: Every day | ORAL | Status: AC
  Administered 2021-06-12: 10 mg via ORAL
  Filled 2021-06-11: qty 1

## 2021-06-11 MED ORDER — HEPARIN SODIUM (PORCINE) 5000 UNIT/ML IJ SOLN: 5000.0000 [IU] | Freq: Three times a day (TID) | INTRAMUSCULAR | Status: DC

## 2021-06-11 MED ORDER — HEPARIN SODIUM (PORCINE) 1000 UNIT/ML DIALYSIS
1000.0000 [IU] | INTRAMUSCULAR | Status: DC | PRN
  Administered 2021-06-11: 3800 [IU] via INTRAVENOUS_CENTRAL

## 2021-06-11 MED ORDER — ACETAMINOPHEN 325 MG PO TABS: 650.0000 mg | ORAL_TABLET | Freq: Four times a day (QID) | ORAL | Status: DC | PRN

## 2021-06-11 MED ORDER — INSULIN ASPART 100 UNIT/ML IJ SOLN: 0.0000 [IU] | Freq: Four times a day (QID) | INTRAMUSCULAR | Status: DC

## 2021-06-11 MED ORDER — PREDNISONE 10 MG PO TABS
5.0000 mg | ORAL_TABLET | Freq: Every day | ORAL | Status: DC
Start: 2021-06-13 — End: 2021-06-12

## 2021-06-11 MED ORDER — SIMETHICONE 80 MG PO CHEW: 80.0000 mg | CHEWABLE_TABLET | Freq: Four times a day (QID) | ORAL | Status: DC | PRN

## 2021-06-11 MED ORDER — CARVEDILOL 12.5 MG PO TABS
25.0000 mg | ORAL_TABLET | Freq: Two times a day (BID) | ORAL | Status: DC
  Administered 2021-06-11: 25 mg via ORAL
  Filled 2021-06-11 (×3): qty 2

## 2021-06-11 MED ORDER — DIPHENHYDRAMINE HCL 25 MG PO CAPS: 25.0000 mg | ORAL_CAPSULE | Freq: Every day | ORAL | Status: DC | PRN

## 2021-06-11 MED ORDER — NITROGLYCERIN 0.4 MG SL SUBL: 0.4000 mg | SUBLINGUAL_TABLET | SUBLINGUAL | Status: DC | PRN

## 2021-06-11 MED ORDER — SODIUM CHLORIDE 0.9 % IV SOLN: 100.0000 mL | INTRAVENOUS | Status: DC | PRN

## 2021-06-11 MED ORDER — INSULIN ASPART 100 UNIT/ML IJ SOLN: 10.0000 [IU] | Freq: Once | INTRAMUSCULAR | Status: DC

## 2021-06-11 MED ORDER — FENTANYL CITRATE PF 50 MCG/ML IJ SOSY
12.5000 ug | PREFILLED_SYRINGE | INTRAMUSCULAR | Status: DC | PRN
  Administered 2021-06-12: 50 ug via INTRAVENOUS
  Filled 2021-06-11: qty 1

## 2021-06-11 MED ORDER — GABAPENTIN 100 MG PO CAPS
100.0000 mg | ORAL_CAPSULE | ORAL | Status: DC
Start: 2021-06-12 — End: 2021-06-12

## 2021-06-11 MED ORDER — GABAPENTIN 100 MG PO CAPS: 100.0000 mg | ORAL_CAPSULE | ORAL | Status: DC

## 2021-06-11 MED ORDER — ONDANSETRON HCL 4 MG/2ML IJ SOLN: 4.0000 mg | Freq: Four times a day (QID) | INTRAMUSCULAR | Status: DC | PRN

## 2021-06-11 MED ORDER — GABAPENTIN 100 MG PO CAPS
200.0000 mg | ORAL_CAPSULE | Freq: Every day | ORAL | Status: DC
  Administered 2021-06-12: 200 mg via ORAL
  Filled 2021-06-11 (×2): qty 2

## 2021-06-11 MED ORDER — TIZANIDINE HCL 4 MG PO TABS: 4.0000 mg | ORAL_TABLET | Freq: Three times a day (TID) | ORAL | Status: DC

## 2021-06-11 MED ORDER — ONDANSETRON HCL 4 MG PO TABS
4.0000 mg | ORAL_TABLET | Freq: Four times a day (QID) | ORAL | Status: DC | PRN
  Administered 2021-06-11: 4 mg via ORAL
  Filled 2021-06-11: qty 1

## 2021-06-11 MED ORDER — AMLODIPINE BESYLATE 5 MG PO TABS
10.0000 mg | ORAL_TABLET | Freq: Every day | ORAL | Status: DC
  Filled 2021-06-11 (×2): qty 2

## 2021-06-11 MED ORDER — HYDROCODONE-ACETAMINOPHEN 5-325 MG PO TABS
1.0000 | ORAL_TABLET | Freq: Once | ORAL | Status: AC
  Administered 2021-06-11: 1 via ORAL
  Filled 2021-06-11: qty 1

## 2021-06-11 MED ORDER — GABAPENTIN 100 MG PO CAPS
100.0000 mg | ORAL_CAPSULE | Freq: Every day | ORAL | Status: DC
  Administered 2021-06-11 – 2021-06-12 (×2): 100 mg via ORAL
  Filled 2021-06-11 (×2): qty 1

## 2021-06-11 MED ORDER — INSULIN ASPART 100 UNIT/ML IJ SOLN
0.0000 [IU] | Freq: Three times a day (TID) | INTRAMUSCULAR | Status: DC
  Administered 2021-06-12: 3 [IU] via SUBCUTANEOUS
  Filled 2021-06-11: qty 1

## 2021-06-11 MED ORDER — HEPARIN SODIUM (PORCINE) 5000 UNIT/ML IJ SOLN
5000.0000 [IU] | Freq: Three times a day (TID) | INTRAMUSCULAR | Status: DC
  Filled 2021-06-11: qty 1

## 2021-06-11 MED ORDER — OXYCODONE HCL 5 MG PO TABS
5.0000 mg | ORAL_TABLET | ORAL | Status: DC | PRN
  Filled 2021-06-11: qty 1

## 2021-06-11 MED ORDER — ATORVASTATIN CALCIUM 10 MG PO TABS
10.0000 mg | ORAL_TABLET | Freq: Every day | ORAL | Status: DC
  Administered 2021-06-12: 10 mg via ORAL
  Filled 2021-06-11: qty 1

## 2021-06-11 MED ORDER — CALCIUM CARBONATE ANTACID 500 MG PO CHEW
1500.0000 mg | CHEWABLE_TABLET | Freq: Three times a day (TID) | ORAL | Status: DC
  Administered 2021-06-11: 1500 mg via ORAL
  Filled 2021-06-11: qty 8

## 2021-06-11 MED ORDER — PREDNISONE 10 MG PO TABS
15.0000 mg | ORAL_TABLET | Freq: Every day | ORAL | Status: AC
  Administered 2021-06-11: 15 mg via ORAL
  Filled 2021-06-11: qty 2

## 2021-06-11 MED ORDER — PANTOPRAZOLE SODIUM 40 MG PO TBEC
40.0000 mg | DELAYED_RELEASE_TABLET | Freq: Every day | ORAL | Status: DC
  Administered 2021-06-11 – 2021-06-12 (×2): 40 mg via ORAL
  Filled 2021-06-11 (×2): qty 1

## 2021-06-11 MED ORDER — DOCUSATE SODIUM 100 MG PO CAPS
100.0000 mg | ORAL_CAPSULE | Freq: Two times a day (BID) | ORAL | Status: DC
  Administered 2021-06-11 – 2021-06-12 (×3): 100 mg via ORAL
  Filled 2021-06-11 (×3): qty 1

## 2021-06-11 NOTE — Assessment & Plan Note (Signed)
-   Continue statin medication  

## 2021-06-11 NOTE — ED Notes (Signed)
Joelyn Oms, MD with [facility] returned my call and reports that the pt is to continue with discharge and have outpatient HD completed as instructed, the pt is encouraged to return if she develops difficulty breathing before her next scheduled HD appt tomorrow 06/12/21. Social worker at bedside per pts request ?

## 2021-06-11 NOTE — H&P (Signed)
History and Physical    Patient: Courtney Gray BJS:[id number] DOB: May 15, 1970 DOA: 06/10/2021 DOS: the patient was seen and examined on 06/11/2021 PCP: Medicine, [facility]  Patient coming from: Home  Chief Complaint:  Chief Complaint  Patient presents with   Shortness of Breath   HPI: Courtney Gray is a 51 y.o. female with medical history significant of morbid obesity, diabetes mellitus type 2, hyperlipidemia, hypertension, ESRD, and more presents ED with a chief complaint of fluid overload.  Patient reports that she did not finish a full dialysis session on Saturday.  Her normal schedule is Tuesday Thursday Saturday, so she is due today.  She reports her session was cut short because of her gout pain she had to go home.  Today she started becoming short of breath which was worse when she would talk or when she would exert herself.  She also noticed rattling in her lungs when she was breathing.  She became more fatigued.  Patient denies chest pain, but admits to exertional palpitations.  She denies any change in her normal headache and also denies any blurry vision.  She reports she has been taking prednisone which make her sugar and her blood pressure go up.  She knew that with all these symptoms she was fluid overloaded so she came into the ED.  Patient does report she was taking the prednisone for gouty arthritis.  She is also been constipated and feeling a tightness and fullness in her abdomen.  Patient admits to nausea but denies vomiting.  Patient has antihypertensives at home, amlodipine and Coreg.  She reports last time she took Coreg was March 6.  She is not sure the last time she took amlodipine.  She reports she does not take either one of them every day.  She was advised to check her blood pressure and if it is over 140 then take 1 of those medications per her report.  Patient has no further complaints at this time.  Patient does not smoke, does not drink alcohol, does not use illicit  drugs.  She is vaccinated for COVID.  Patient is full code. Review of Systems: As mentioned in the history of present illness. All other systems reviewed and are negative. Past Medical History:  Diagnosis Date   Diabetes mellitus without complication (HCC)    High cholesterol    Hypertension    Renal disorder    Past Surgical History:  Procedure Laterality Date   AV FISTULA PLACEMENT     x2, unsuccessful.    IR FLUORO GUIDE CV LINE LEFT  12/14/2020   IR FLUORO GUIDE CV LINE LEFT  02/20/2021   IR FLUORO GUIDE CV LINE RIGHT  08/28/2020   IR FLUORO GUIDE CV LINE RIGHT  05/13/2021   IR FLUORO GUIDE CV LINE RIGHT  05/24/2021   IR PERC TUN PERIT CATH WO PORT S&I /IMAG  09/06/2020   IR PTA VENOUS EXCEPT DIALYSIS CIRCUIT  02/20/2021   IR PTA VENOUS EXCEPT DIALYSIS CIRCUIT  05/13/2021   IR REMOVAL TUN CV CATH W/O FL  05/24/2021   IR US GUIDE VASC ACCESS LEFT  09/06/2020   IR US GUIDE VASC ACCESS RIGHT  05/24/2021   IR VENOCAVAGRAM SVC  02/20/2021   Social History:  reports that she has never smoked. She has never used smokeless tobacco. She reports that she does not drink alcohol and does not use drugs.  Allergies  Allergen Reactions   Benzonatate Other (See Comments)    Hallucinations   Hydralazine  Other (See Comments)    Hallucinations   Amoxicillin Nausea And Vomiting   Clonidine Other (See Comments)    Altered mental state   Morphine Itching   Oxycodone Itching    Family History  Problem Relation Age of Onset   Diabetes Mother    Diabetes Father     Prior to Admission medications   Medication Sig Start Date End Date Taking? Authorizing Provider  acetaminophen (TYLENOL) 500 MG tablet Take 1,000 mg by mouth every 6 (six) hours as needed for moderate pain or headache.   Yes [provider]  amLODipine (NORVASC) 10 MG tablet Take 1 tablet (10 mg total) by mouth daily. Patient taking differently: Take 10 mg by mouth daily as needed (Blood pressusre). 11/29/20  Yes Roxan Hockey,  MD  aspirin EC 81 MG tablet Take 1 tablet (81 mg total) by mouth daily with breakfast. 11/28/20  Yes Emokpae, Courage, MD  atorvastatin (LIPITOR) 10 MG tablet Take 1 tablet (10 mg total) by mouth daily. Patient taking differently: Take 10 mg by mouth at bedtime. 11/28/20  Yes Emokpae, Courage, MD  calcium carbonate (TUMS SMOOTHIES) 750 MG chewable tablet Chew 750-1,500 mg by mouth See admin instructions. Take 1500 mg with each meal and 750 mg with each snack   Yes [provider]  carvedilol (COREG) 25 MG tablet Take 1 tablet (25 mg total) by mouth 2 (two) times daily. Patient taking differently: Take 25 mg by mouth 2 (two) times daily. Do not take on dialysis days Tuesday Thur. And Sat. Take as needed. Take if blood pressure is over 140 11/28/20  Yes Emokpae, Courage, MD  diphenhydrAMINE (BENADRYL) 25 MG tablet Take 25 mg by mouth daily as needed for allergies or sleep.   Yes [provider]  docusate sodium (COLACE) 100 MG capsule Take 100 mg by mouth 2 (two) times daily.   Yes [provider]  gabapentin (NEURONTIN) 100 MG capsule Take 100-200 mg by mouth See admin instructions. 1 tablet in the morning and 1 tablet around 5pm then take 1 tablet at bedtime. Dialysis days take 2 capsules at 5pm 04/20/19  Yes [provider]  glipiZIDE (GLUCOTROL XL) 2.5 MG 24 hr tablet Take 2.5 mg by mouth daily. 03/22/21  Yes [provider]  HYDROcodone-acetaminophen (NORCO/VICODIN) 5-325 MG tablet Take 1 tablet by mouth 3 (three) times daily as needed. 05/29/21  Yes [provider]  LANTUS SOLOSTAR 100 UNIT/ML Solostar Pen Inject 12 Units into the skin at bedtime. 03/18/19  Yes [provider]  loperamide (IMODIUM A-D) 2 MG tablet Take 4 mg by mouth as needed for diarrhea or loose stools.   Yes [provider]  nitroGLYCERIN (NITROSTAT) 0.4 MG SL tablet Place 1 tablet (0.4 mg total) under the tongue every 5 (five) minutes as needed for chest pain.  05/24/21  Yes [place] Eric J, DO  pantoprazole (PROTONIX) 40 MG tablet Take 1 tablet (40 mg total) by mouth daily. 11/29/20  Yes Emokpae, Courage, MD  Phenol (CHLORASEPTIC MT) Use as directed 4 sprays in the mouth or throat 4 (four) times daily as needed (throat pain).   Yes [provider]  predniSONE (STERAPRED UNI-PAK 21 TAB) 5 MG (21) TBPK tablet Take 5 mg by mouth See admin instructions. Take as directed 6,5,4 ,3,2,1 06/07/21  Yes [provider]  PROCTOZONE-HC 2.5 % rectal cream Place 1 application. rectally daily as needed for hemorrhoids. 05/29/21  Yes [provider]  simethicone (MYLICON) 299 MG chewable tablet Chew  125 mg by mouth 2 (two) times daily as needed for flatulence.   Yes [provider]  tiZANidine (ZANAFLEX) 4 MG tablet Take 4 mg by mouth every 8 (eight) hours. Take with gabapentin at same time 04/20/19  Yes [provider]  doxercalciferol (HECTOROL) 4 MCG/2ML injection Inject 2.5 mLs (5 mcg total) into the vein Every Tuesday,Thursday,and Saturday with dialysis. Patient not taking: Reported on 06/10/2021 09/01/20   Enzo Bi, MD    Physical Exam: Vitals:   06/11/21 0315 06/11/21 0330 06/11/21 0345 06/11/21 0415  BP: (!) 216/66 (!) 202/77 (!) 209/85 (!) 184/70  Pulse: 66 66 67 66  Resp: 15 16 18 18   Temp:      TempSrc:      SpO2:      Weight:      Height:       1.  General: Patient lying supine in bed, head of bed elevated, no acute distress   2. Psychiatric: Alert and oriented x 3, mood and behavior normal for situation, pleasant and cooperative with exam   3. Neurologic: Speech and language are normal, face is symmetric, moves all 4 extremities voluntarily, at baseline without acute deficits on limited exam   4. HEENMT:  Head is atraumatic, normocephalic, pupils reactive to light, neck is supple, trachea is midline, mucous membranes are moist   5. Respiratory : Lungs are difficult to auscultate due to body habitus, but  at the time of my exam they seem to be clear to auscultation bilaterally without wheezing, rhonchi, rales, no cyanosis, no increase in work of breathing or accessory muscle use   6. Cardiovascular : Heart rate normal, rhythm is regular, no murmurs, rubs or gallops, significant peripheral edema, peripheral pulses palpated   7. Gastrointestinal:  Abdomen is soft, nondistended, mildly tender diffusely, bowel sounds active, no masses or organomegaly palpated   8. Skin:  Skin is warm, dry and intact with mild erythema at right lower extremity great toe.  There is also Vaseline between her toes that initially appears as drainage, but is not.   9.Musculoskeletal:  No acute deformities or trauma, no asymmetry in tone,  peripheral pulses palpated, no tenderness to palpation in the extremities  Data Reviewed:  In the ED Temp 98.3, heart rate 60-88, respiratory 15-26, blood pressure 123/ 108-224/100s With O2 sats 97% on 4 L Leukocytosis 19, hemoglobin 7.9, platelets 222 Chemistry panel reveals a creatinine of 12.41 and a hyperglycemia at 325 Chest x-ray shows perihilar congestion and bilateral interstitial edema with small pleural effusions findings consistent with CHF or fluid overload EKG shows a heart rate 73, QTc 462, sinus rhythm Patient hypoxic and placed on 4 L nasal cannula Patient initially placed on BiPAP, but not on BiPAP at time of dialysis Admission requested for hypertensive emergency  Assessment and Plan: * Acute respiratory failure with hypoxia (HCC) Oxygen requirement up to 4 L Secondary to fluid overload and hypertensive emergency with pulmonary edema BiPAP ordered in the ED, but not started Patient comfortable on 4 L during dialysis at the time of my exam Continue nitro drip Continue to monitor  ESRD (end stage renal disease) [facility]) Consult nephrology Patient in dialysis at the time of my exam Trend chemistry, Phos, magnesium Continue to  monitor  Leukocytosis Leukocytosis 19 No consolidation on chest x-ray No other infectious symptoms Respiratory panel negative Likely related to recent gout flare and prednisone taper Trend in the a.m.  Hyperkalemia Secondary to ESRD and partial dialysis at last session Initially [facility]  ordered We will, was held so patient can go to dialysis Trend chemistry in the morning after dialysis Continue to monitor  Hyperlipidemia Continue statin medication  Morbid obesity with BMI of 60.0-69.9, adult [facility]) Patient given extensive counseling regarding weight loss, tracking calories, appropriate diets and appropriate exercises Patient verbalizes understanding  Hypertensive emergency Blood pressures as high as 224/100 Continue to titrate nitro drip Life-threatening endorgan damage evidenced by respiratory failure Continue to monitor  Type 2 diabetes mellitus with other specified complication [facility]) Patient has a glucose of 325 in the ED, her bicarb 20, gap 19 pH normal on VBG Mentation normal Does not meet criteria for DKA Every 4 hour sliding scale coverage Trend chemistry in the a.m. Closely monitor CBGs Last hemoglobin A1c was 8.9 Continue reduced dose of basal insulin Continue to monitor       Advance Care Planning:   Code Status: Full Code   Consults: Nephrology  Family Communication: No family at bedside  Severity of Illness: The appropriate patient status for this patient is INPATIENT. Inpatient status is judged to be reasonable and necessary in order to provide the required intensity of service to ensure the patient's safety. The patient's presenting symptoms, physical exam findings, and initial radiographic and laboratory data in the context of their chronic comorbidities is felt to place them at high risk for further clinical deterioration. Furthermore, it is not anticipated that the patient will be medically stable for discharge from the hospital within 2 midnights  of admission.   * I certify that at the point of admission it is my clinical judgment that the patient will require inpatient hospital care spanning beyond 2 midnights from the point of admission due to high intensity of service, high risk for further deterioration and high frequency of surveillance required.*  Author: Rolla Plate, DO 06/11/2021 4:22 AM  For on call review [email or web address].

## 2021-06-11 NOTE — ED Notes (Addendum)
Rounded on patient. She states she has not heard from her appeal and that she also was awaiting a call from her doctor. Discussed transportation options and comfort. States she needs her medication especially her steroids. ? ?Camera operator, Mardee Postin aware of patient's needs for meds. She will contact hospitalist for further orders. ?

## 2021-06-11 NOTE — Discharge Instructions (Addendum)
Please go to hemodialysis on your regular schedule.   ? ? ?IMPORTANT INFORMATION: PAY CLOSE ATTENTION  ? ?PHYSICIAN DISCHARGE INSTRUCTIONS ? ?Follow with Primary care provider  Medicine, [facility]  and other consultants as instructed by your Hospitalist Physician ? ?SEEK MEDICAL CARE OR RETURN TO EMERGENCY ROOM IF SYMPTOMS COME BACK, WORSEN OR NEW PROBLEM DEVELOPS  ? ?Please note: ?You were cared for by a hospitalist during your hospital stay. Every effort will be made to forward records to your primary care provider.  You can request that your primary care provider send for your hospital records if they have not received them.  Once you are discharged, your primary care physician will handle any further medical issues. Please note that NO REFILLS for any discharge medications will be authorized once you are discharged, as it is imperative that you return to your primary care physician (or establish a relationship with a primary care physician if you do not have one) for your post hospital discharge needs so that they can reassess your need for medications and monitor your lab values. ? ?Please get a complete blood count and chemistry panel checked by your Primary MD at your next visit, and again as instructed by your Primary MD. ? ?Get Medicines reviewed and adjusted: ?Please take all your medications with you for your next visit with your Primary MD ? ?Laboratory/radiological data: ?Please request your Primary MD to go over all hospital tests and procedure/radiological results at the follow up, please ask your primary care provider to get all Hospital records sent to his/her office. ? ?In some cases, they will be blood work, cultures and biopsy results pending at the time of your discharge. Please request that your primary care provider follow up on these results. ? ?If you are diabetic, please bring your blood sugar readings with you to your follow up appointment with primary care.   ? ?Please call and make  your follow up appointments as soon as possible.   ? ?Also Note the following: ?If you experience worsening of your admission symptoms, develop shortness of breath, life threatening emergency, suicidal or homicidal thoughts you must seek medical attention immediately by calling 911 or calling your MD immediately  if symptoms less severe. ? ?You must read complete instructions/literature along with all the possible adverse reactions/side effects for all the Medicines you take and that have been prescribed to you. Take any new Medicines after you have completely understood and accpet all the possible adverse reactions/side effects.  ? ?Do not drive when taking Pain medications or sleeping medications (Benzodiazepines) ? ?Do not take more than prescribed Pain, Sleep and Anxiety Medications. It is not advisable to combine anxiety,sleep and pain medications without talking with your primary care practitioner ? ?Special Instructions: If you have smoked or chewed Tobacco  in the last 2 yrs please stop smoking, stop any regular Alcohol  and or any Recreational drug use. ? ?Wear Seat belts while driving.  Do not drive if taking any narcotic, mind altering or controlled substances or recreational drugs or alcohol.  ? ? ? ? ? ?

## 2021-06-11 NOTE — ED Notes (Signed)
Courtney Oms, MD reports that the director can speak with the pt as he has nothing new to add since the last conversation that he had with the pt, AC contacted to speak with the pt ?

## 2021-06-11 NOTE — ED Notes (Signed)
MD notified re: pts questions pertaining to HD and her dry weight, attempting to speak with nephrology, pt requesting to talk with Social worker,  pt encouraged to contact transportation or let this RN know if she will require transportation in the event she continues to be up for discharge, PT aware that her requests have been discussed with the Wynetta Emery, MD ?

## 2021-06-11 NOTE — Progress Notes (Signed)
Still attempting to patient on machine with no success. MD in dialysis room talking with patient Rt will check back in a few minutes ?

## 2021-06-11 NOTE — Assessment & Plan Note (Addendum)
Patient has a glucose of 325 in the ED, her bicarb 20, gap 19 ?pH normal on VBG ?Mentation normal ?Does not meet criteria for DKA ?Every 4 hour sliding scale coverage ?04/23/21 hemoglobin A1c was 8.9 ?Continue reduced dose of basal insulin ? ?

## 2021-06-11 NOTE — Assessment & Plan Note (Signed)
Pt is currently completing a steroid taper.  ?

## 2021-06-11 NOTE — Assessment & Plan Note (Signed)
Patient given extensive counseling regarding weight loss, tracking calories, appropriate diets and appropriate exercises ?Patient verbalizes understanding ?

## 2021-06-11 NOTE — Discharge Summary (Signed)
Physician Discharge Summary  Kensli Bowley ACZ:[id number] DOB: Jan 13, 1971 DOA: 06/10/2021  PCP: [facility]  Admit date: 06/10/2021 Discharge date: 06/11/2021  Admitted From:  Home  Disposition: Home   Recommendations for Outpatient Follow-up:  Resume regular hemodialysis schedule Please see your PCP for follow up in 1 week Follow up with cardiologist as scheduled.   Discharge Condition: STABLE   CODE STATUS: FULL  DIET: resume renal carb modified    Brief Hospitalization Summary: Please see all hospital notes, images, labs for full details of the hospitalization. ADMISSION HPI:   51 y.o. female with medical history significant of morbid obesity, diabetes mellitus type 2, hyperlipidemia, hypertension, ESRD, and more presents ED with a chief complaint of fluid overload.  Patient reports that she did not finish a full dialysis session on Saturday.  Her normal schedule is Tuesday Thursday Saturday, so she is due today.  She reports her session was cut short because of her gout pain she had to go home.  Today she started becoming short of breath which was worse when she would talk or when she would exert herself.  She also noticed rattling in her lungs when she was breathing.  She became more fatigued.  Patient denies chest pain, but admits to exertional palpitations.  She denies any change in her normal headache and also denies any blurry vision.  She reports she has been taking prednisone which make her sugar and her blood pressure go up.  She knew that with all these symptoms she was fluid overloaded so she came into the ED.  Patient does report she was taking the prednisone for gouty arthritis.  She is also been constipated and feeling a tightness and fullness in her abdomen.  Patient admits to nausea but denies vomiting.  Patient has antihypertensives at home, amlodipine and Coreg.  She reports last time she took Coreg was March 6.  She is not sure the last time she took amlodipine.  She  reports she does not take either one of them every day.  She was advised to check her blood pressure and if it is over 140 then take 1 of those medications per her report.  Patient has no further complaints at this time.   Patient does not smoke, does not drink alcohol, does not use illicit drugs.  She is vaccinated for COVID.  Patient is full code.  Hospital Course Pt was volume overloaded from not having completed her HD treatment and needed emergency hemodialysis treatment. Nephrologist and dialysis nurse called and HD was completed and 4L was removed with improvement in weight, BP and volume status.  Pt was evaluated again this morning by nephrologist Dr. Joelyn Oms and no further HD needed for today.  He says patient can resume her regular HD schedule 3/9.  BPs have improved and patient off all drips, not requiring bipap and back at baseline status. DC home today.    Discharge Diagnoses:  Principal Problem:   Acute respiratory failure with hypoxia ([place]) Active Problems:   Type 2 diabetes mellitus with other specified complication (HCC)   Hypertensive emergency   ESRD (end stage renal disease) ([place])   Morbid obesity with BMI of 60.0-69.9, adult (HCC)   Hyperlipidemia   Hyperkalemia   Leukocytosis  Discharge Instructions:  Allergies as of 06/11/2021       Reactions   Benzonatate Other (See Comments)   Hallucinations   Hydralazine Other (See Comments)   Hallucinations   Amoxicillin Nausea And Vomiting   Clonidine Other (  See Comments)   Altered mental state   Morphine Itching   Oxycodone Itching        Medication List     STOP taking these medications    doxercalciferol 4 MCG/2ML injection Commonly known as: HECTOROL   glipiZIDE 2.5 MG 24 hr tablet Commonly known as: GLUCOTROL XL       TAKE these medications    acetaminophen 500 MG tablet Commonly known as: TYLENOL Take 1,000 mg by mouth every 6 (six) hours as needed for moderate pain or headache.   amLODipine 10  MG tablet Commonly known as: NORVASC Take 1 tablet (10 mg total) by mouth daily. What changed:  when to take this reasons to take this   aspirin EC 81 MG tablet Take 1 tablet (81 mg total) by mouth daily with breakfast.   atorvastatin 10 MG tablet Commonly known as: LIPITOR Take 1 tablet (10 mg total) by mouth daily. What changed: when to take this   carvedilol 25 MG tablet Commonly known as: COREG Take 1 tablet (25 mg total) by mouth 2 (two) times daily. What changed: additional instructions   CHLORASEPTIC MT Use as directed 4 sprays in the mouth or throat 4 (four) times daily as needed (throat pain).   diphenhydrAMINE 25 MG tablet Commonly known as: BENADRYL Take 25 mg by mouth daily as needed for allergies or sleep.   docusate sodium 100 MG capsule Commonly known as: COLACE Take 100 mg by mouth 2 (two) times daily.   gabapentin 100 MG capsule Commonly known as: NEURONTIN Take 100-200 mg by mouth See admin instructions. 1 tablet in the morning and 1 tablet around 5pm then take 1 tablet at bedtime. Dialysis days take 2 capsules at 5pm   HYDROcodone-acetaminophen 5-325 MG tablet Commonly known as: NORCO/VICODIN Take 1 tablet by mouth 3 (three) times daily as needed.   Lantus SoloStar 100 UNIT/ML Solostar Pen Generic drug: insulin glargine Inject 12 Units into the skin at bedtime.   loperamide 2 MG tablet Commonly known as: IMODIUM A-D Take 4 mg by mouth as needed for diarrhea or loose stools.   nitroGLYCERIN 0.4 MG SL tablet Commonly known as: NITROSTAT Place 1 tablet (0.4 mg total) under the tongue every 5 (five) minutes as needed for chest pain.   pantoprazole 40 MG tablet Commonly known as: PROTONIX Take 1 tablet (40 mg total) by mouth daily.   predniSONE 5 MG (21) Tbpk tablet Commonly known as: STERAPRED UNI-PAK 21 TAB Take 5 mg by mouth See admin instructions. Take as directed 6,5,4 ,3,2,1   Proctozone-HC 2.5 % rectal cream Generic drug:  hydrocortisone Place 1 application. rectally daily as needed for hemorrhoids.   simethicone 125 MG chewable tablet Commonly known as: MYLICON Chew 102 mg by mouth 2 (two) times daily as needed for flatulence.   tiZANidine 4 MG tablet Commonly known as: ZANAFLEX Take 4 mg by mouth every 8 (eight) hours. Take with gabapentin at same time   Tums Smoothies 750 MG chewable tablet Generic drug: calcium carbonate Chew 750-1,500 mg by mouth See admin instructions. Take 1500 mg with each meal and 750 mg with each snack        Follow-up Information     [facility]. Schedule an appointment as soon as possible for a visit.   Specialty: Internal Medicine Why: Hospital Follow Up Contact information: [place] [id number] [phone]                Allergies  Allergen Reactions   Benzonatate Other (See Comments)    Hallucinations   Hydralazine Other (See Comments)    Hallucinations   Amoxicillin Nausea And Vomiting   Clonidine Other (See Comments)    Altered mental state   Morphine Itching   Oxycodone Itching   Allergies as of 06/11/2021       Reactions   Benzonatate Other (See Comments)   Hallucinations   Hydralazine Other (See Comments)   Hallucinations   Amoxicillin Nausea And Vomiting   Clonidine Other (See Comments)   Altered mental state   Morphine Itching   Oxycodone Itching        Medication List     STOP taking these medications    doxercalciferol 4 MCG/2ML injection Commonly known as: HECTOROL   glipiZIDE 2.5 MG 24 hr tablet Commonly known as: GLUCOTROL XL       TAKE these medications    acetaminophen 500 MG tablet Commonly known as: TYLENOL Take 1,000 mg by mouth every 6 (six) hours as needed for moderate pain or headache.   amLODipine 10 MG tablet Commonly known as: NORVASC Take 1 tablet (10 mg total) by mouth daily. What changed:  when to take this reasons to take this   aspirin EC 81 MG tablet Take 1 tablet  (81 mg total) by mouth daily with breakfast.   atorvastatin 10 MG tablet Commonly known as: LIPITOR Take 1 tablet (10 mg total) by mouth daily. What changed: when to take this   carvedilol 25 MG tablet Commonly known as: COREG Take 1 tablet (25 mg total) by mouth 2 (two) times daily. What changed: additional instructions   CHLORASEPTIC MT Use as directed 4 sprays in the mouth or throat 4 (four) times daily as needed (throat pain).   diphenhydrAMINE 25 MG tablet Commonly known as: BENADRYL Take 25 mg by mouth daily as needed for allergies or sleep.   docusate sodium 100 MG capsule Commonly known as: COLACE Take 100 mg by mouth 2 (two) times daily.   gabapentin 100 MG capsule Commonly known as: NEURONTIN Take 100-200 mg by mouth See admin instructions. 1 tablet in the morning and 1 tablet around 5pm then take 1 tablet at bedtime. Dialysis days take 2 capsules at 5pm   HYDROcodone-acetaminophen 5-325 MG tablet Commonly known as: NORCO/VICODIN Take 1 tablet by mouth 3 (three) times daily as needed.   Lantus SoloStar 100 UNIT/ML Solostar Pen Generic drug: insulin glargine Inject 12 Units into the skin at bedtime.   loperamide 2 MG tablet Commonly known as: IMODIUM A-D Take 4 mg by mouth as needed for diarrhea or loose stools.   nitroGLYCERIN 0.4 MG SL tablet Commonly known as: NITROSTAT Place 1 tablet (0.4 mg total) under the tongue every 5 (five) minutes as needed for chest pain.   pantoprazole 40 MG tablet Commonly known as: PROTONIX Take 1 tablet (40 mg total) by mouth daily.   predniSONE 5 MG (21) Tbpk tablet Commonly known as: STERAPRED UNI-PAK 21 TAB Take 5 mg by mouth See admin instructions. Take as directed 6,5,4 ,3,2,1   Proctozone-HC 2.5 % rectal cream Generic drug: hydrocortisone Place 1 application. rectally daily as needed for hemorrhoids.   simethicone 125 MG chewable tablet Commonly known as: MYLICON Chew 287 mg by mouth 2 (two) times daily as needed  for flatulence.   tiZANidine 4 MG tablet Commonly known as: ZANAFLEX Take 4 mg by mouth every 8 (eight) hours. Take with gabapentin at same time   Tums Smoothies 750  MG chewable tablet Generic drug: calcium carbonate Chew 750-1,500 mg by mouth See admin instructions. Take 1500 mg with each meal and 750 mg with each snack        Procedures/Studies: CT ABDOMEN PELVIS WO CONTRAST  Result Date: 05/19/2021 CLINICAL DATA:  Rectal bleeding, rule out GMS EXAM: CT ABDOMEN AND PELVIS WITHOUT CONTRAST TECHNIQUE: Multidetector CT imaging of the abdomen and pelvis was performed following the standard protocol without IV contrast. RADIATION DOSE REDUCTION: This exam was performed according to the departmental dose-optimization program which includes automated exposure control, adjustment of the mA and/or kV according to patient size and/or use of iterative reconstruction technique. COMPARISON:  09/02/2020 FINDINGS: Lower chest: No acute abnormality. Extensive three-vessel artery calcifications. Hepatobiliary: No solid liver abnormality is seen. No gallstones, gallbladder wall thickening, or biliary dilatation. Pancreas: Unremarkable. No pancreatic ductal dilatation or surrounding inflammatory changes. Spleen: Normal in size without significant abnormality. Adrenals/Urinary Tract: Adrenal glands are unremarkable. Very atrophic bilateral kidneys. No calculi or hydronephrosis. Bladder is unremarkable. Stomach/Bowel: Stomach is within normal limits. Appendix appears normal. No evidence of bowel wall thickening, distention, or inflammatory changes. Previously ingested barium contrast admixed with stool in the distal colon and rectum. Vascular/Lymphatic: Aortic atherosclerosis and vascular calcinosis. No enlarged abdominal or pelvic lymph nodes. Reproductive: No mass or other significant abnormality. Other: No abdominal wall hernia or abnormality. No ascites. Musculoskeletal: No acute or significant osseous findings.  IMPRESSION: 1. Previously ingested barium contrast admixed with stool in the distal colon and rectum. Within this limitation, no acute noncontrast CT findings of the abdomen or pelvis to explain or localize rectal bleeding. 2. Very atrophic bilateral kidneys. 3. Coronary artery disease. Aortic Atherosclerosis (ICD10-I70.0). Electronically Signed   By: Delanna Ahmadi M.D.   On: 05/19/2021 18:02   IR Fluoro Guide CV Line Right  Result Date: 05/24/2021 CLINICAL DATA:  End-stage renal disease needing hemodialysis long-term. Poor function of left tunneled IJ hemodialysis catheter despite recent revision. Contralateral catheter placement requested. EXAM: TUNNELED HEMODIALYSIS CATHETER PLACEMENT WITH ULTRASOUND AND FLUOROSCOPIC GUIDANCE REMOVAL OF TUNNELED HEMODIALYSIS CATHETER TECHNIQUE: The procedure, risks, benefits, and alternatives were explained to the patient. Questions regarding the procedure were encouraged and answered. The patient understands and consents to the procedure. As antibiotic prophylaxis, vancomycin 1 G was ordered pre-procedure and administered intravenously within one hour of incision.Patency of the right IJ vein was confirmed with ultrasound with image documentation. An appropriate skin site was determined. Region was prepped using maximum barrier technique including cap and mask, sterile gown, sterile gloves, large sterile sheet, and Chlorhexidine as cutaneous antisepsis. The region was infiltrated locally with 1% lidocaine. Intravenous Fentanyl 164mcg and Versed 1.5mg  were administered as conscious sedation during continuous monitoring of the patient's level of consciousness and physiological / cardiorespiratory status by the radiology RN, with a total moderate sedation time of 15 minutes. Under real-time ultrasound guidance, the right IJ vein was accessed with a 21 gauge micropuncture needle; the needle tip within the vein was confirmed with ultrasound image documentation. Needle exchanged  over the 018 guidewire for transitional dilator, which allowed advancement of a Benson wire into the IVC. Over this, an MPA catheter was advanced. A Palindrome 23 hemodialysis catheter was tunneled from the right anterior chest wall approach to the right IJ dermatotomy site. The MPA catheter was exchanged over an Amplatz wire for serial vascular dilators which allow placement of a peel-away sheath, through which the catheter was advanced under intermittent fluoroscopy, positioned with its tips in the proximal and midright atrium. Spot  chest radiograph confirms good catheter position. No pneumothorax. Catheter was flushed and primed per protocol. Catheter secured externally with O Prolene sutures. The right IJ dermatotomy site was closed with Dermabond. Attention was turned to the previously placed left tunneled hemodialysis catheter. The retention sutures were cut. The catheter was removed intact using gentle traction. The patient tolerated the procedure well. COMPLICATIONS: COMPLICATIONS None immediate FLUOROSCOPY: Radiation Exposure Index (as provided by the fluoroscopic device): 55 mGy Kerma COMPARISON:  None IMPRESSION: 1. Technically successful placement of tunneled right IJ hemodialysis catheter with ultrasound and fluoroscopic guidance. Ready for routine use. 2. Removal of tunneled left chest hemodialysis catheter ACCESS: Remains approachable for percutaneous intervention as needed. Electronically Signed   By: Lucrezia Europe M.D.   On: 05/24/2021 14:13   IR Fluoro Guide CV Line Right  Result Date: 05/13/2021 INDICATION: 51 year old female with a history of end-stage renal disease on hemodialysis via a left IJ 28 cm palindrome hemodialysis catheter. This catheter was last exchanged on 02/20/2021 at which time balloon disruption of an underlying fibrin sheath was performed. The catheter then worked well until recently when she began experience positional dependence and poor flows during hemodialysis. She  presents for catheter evaluation with possible repeat balloon disruption and replacement. Of note, in the future the patient would like to switch to a right sided hemodialysis catheter. However, she was not prepared for moderate sedation today. EXAM: IR RIGHT FLUORO GUIDE CV LINE; IR PTA VENOUS EXCEPT DIALYSIS CIRCUIT MEDICATIONS: None. ANESTHESIA/SEDATION: None. FLUOROSCOPY TIME:  145 mGy air kerma COMPLICATIONS: None immediate. PROCEDURE: Informed written consent was obtained from the patient after a thorough discussion of the procedural risks, benefits and alternatives. All questions were addressed. Maximal Sterile Barrier Technique was utilized including caps, mask, sterile gowns, sterile gloves, sterile drape, hand hygiene and skin antiseptic. A timeout was performed prior to the initiation of the procedure. Initial spot fluoroscopic imaging was performed. The 28 cm palindrome tunneled hemodialysis catheter is relatively well position with the catheter tip overlying the upper right atrium. The venous port flushes and aspirates easily. The arterial port could not be aspirated. Contrast injection was first performed through the venous port. This demonstrates normal filling of the right atrium. Local anesthesia was attained by infiltration with 1% lidocaine. The retention cuff was released from the surrounding soft tissues using sharp dissection. A stiff glidewire was advanced through the venous lumen, through the right heart and into the inferior vena cava. As the catheter was manipulated, aspiration was maintained on the arterial port in till there was successful aspiration of the indwelling heparin and blood. At this time, contrast was injected through the arterial lumen under digital subtraction angiography. There is clear evidence of a fibrin sheath along the distal aspect of the catheter. This is likely the underlying source of catheter malfunction. The catheter was removed. A 16 mm atlas balloon was  advanced over the wire and positioned in the region of the fibrin sheath. The balloon was then inflated to full effacement disrupting the fibrin sheath. The balloon was removed. A new 28 cm palindrome tunneled hemodialysis catheter was advanced over the wire and position with the tip in the right mid atrium. Both the arterial and venous lumens flush and aspirate easily. The catheter was flushed with saline in the lumens locked with 2.1 mL heparin. The catheter was capped and secured to the skin with 0 Prolene suture. Sterile bandages were applied. The patient tolerated the procedure well. IMPRESSION: 1. Fibrin sheath partially obstructing flow through  the arterial lumen. 2. Successful balloon disruption of fibrin sheath. 3. Successful exchange for a new 28 cm palindrome tunneled hemodialysis catheter. Catheter tips are in the right mid atrium and the device is ready for immediate use. Electronically Signed   By: Jacqulynn Cadet M.D.   On: 05/13/2021 13:09   IR Removal Tun Cv Cath W/O FL  Result Date: 05/24/2021 CLINICAL DATA:  End-stage renal disease needing hemodialysis long-term. Poor function of left tunneled IJ hemodialysis catheter despite recent revision. Contralateral catheter placement requested. EXAM: TUNNELED HEMODIALYSIS CATHETER PLACEMENT WITH ULTRASOUND AND FLUOROSCOPIC GUIDANCE REMOVAL OF TUNNELED HEMODIALYSIS CATHETER TECHNIQUE: The procedure, risks, benefits, and alternatives were explained to the patient. Questions regarding the procedure were encouraged and answered. The patient understands and consents to the procedure. As antibiotic prophylaxis, vancomycin 1 G was ordered pre-procedure and administered intravenously within one hour of incision.Patency of the right IJ vein was confirmed with ultrasound with image documentation. An appropriate skin site was determined. Region was prepped using maximum barrier technique including cap and mask, sterile gown, sterile gloves, large sterile sheet,  and Chlorhexidine as cutaneous antisepsis. The region was infiltrated locally with 1% lidocaine. Intravenous Fentanyl 141mcg and Versed 1.5mg  were administered as conscious sedation during continuous monitoring of the patient's level of consciousness and physiological / cardiorespiratory status by the radiology RN, with a total moderate sedation time of 15 minutes. Under real-time ultrasound guidance, the right IJ vein was accessed with a 21 gauge micropuncture needle; the needle tip within the vein was confirmed with ultrasound image documentation. Needle exchanged over the 018 guidewire for transitional dilator, which allowed advancement of a Benson wire into the IVC. Over this, an MPA catheter was advanced. A Palindrome 23 hemodialysis catheter was tunneled from the right anterior chest wall approach to the right IJ dermatotomy site. The MPA catheter was exchanged over an Amplatz wire for serial vascular dilators which allow placement of a peel-away sheath, through which the catheter was advanced under intermittent fluoroscopy, positioned with its tips in the proximal and midright atrium. Spot chest radiograph confirms good catheter position. No pneumothorax. Catheter was flushed and primed per protocol. Catheter secured externally with O Prolene sutures. The right IJ dermatotomy site was closed with Dermabond. Attention was turned to the previously placed left tunneled hemodialysis catheter. The retention sutures were cut. The catheter was removed intact using gentle traction. The patient tolerated the procedure well. COMPLICATIONS: COMPLICATIONS None immediate FLUOROSCOPY: Radiation Exposure Index (as provided by the fluoroscopic device): 55 mGy Kerma COMPARISON:  None IMPRESSION: 1. Technically successful placement of tunneled right IJ hemodialysis catheter with ultrasound and fluoroscopic guidance. Ready for routine use. 2. Removal of tunneled left chest hemodialysis catheter ACCESS: Remains approachable for  percutaneous intervention as needed. Electronically Signed   By: Lucrezia Europe M.D.   On: 05/24/2021 14:13   IR US Guide Vasc Access Right  Result Date: 05/24/2021 CLINICAL DATA:  End-stage renal disease needing hemodialysis long-term. Poor function of left tunneled IJ hemodialysis catheter despite recent revision. Contralateral catheter placement requested. EXAM: TUNNELED HEMODIALYSIS CATHETER PLACEMENT WITH ULTRASOUND AND FLUOROSCOPIC GUIDANCE REMOVAL OF TUNNELED HEMODIALYSIS CATHETER TECHNIQUE: The procedure, risks, benefits, and alternatives were explained to the patient. Questions regarding the procedure were encouraged and answered. The patient understands and consents to the procedure. As antibiotic prophylaxis, vancomycin 1 G was ordered pre-procedure and administered intravenously within one hour of incision.Patency of the right IJ vein was confirmed with ultrasound with image documentation. An appropriate skin site was determined. Region was prepped  using maximum barrier technique including cap and mask, sterile gown, sterile gloves, large sterile sheet, and Chlorhexidine as cutaneous antisepsis. The region was infiltrated locally with 1% lidocaine. Intravenous Fentanyl 153mcg and Versed 1.5mg  were administered as conscious sedation during continuous monitoring of the patient's level of consciousness and physiological / cardiorespiratory status by the radiology RN, with a total moderate sedation time of 15 minutes. Under real-time ultrasound guidance, the right IJ vein was accessed with a 21 gauge micropuncture needle; the needle tip within the vein was confirmed with ultrasound image documentation. Needle exchanged over the 018 guidewire for transitional dilator, which allowed advancement of a Benson wire into the IVC. Over this, an MPA catheter was advanced. A Palindrome 23 hemodialysis catheter was tunneled from the right anterior chest wall approach to the right IJ dermatotomy site. The MPA catheter  was exchanged over an Amplatz wire for serial vascular dilators which allow placement of a peel-away sheath, through which the catheter was advanced under intermittent fluoroscopy, positioned with its tips in the proximal and midright atrium. Spot chest radiograph confirms good catheter position. No pneumothorax. Catheter was flushed and primed per protocol. Catheter secured externally with O Prolene sutures. The right IJ dermatotomy site was closed with Dermabond. Attention was turned to the previously placed left tunneled hemodialysis catheter. The retention sutures were cut. The catheter was removed intact using gentle traction. The patient tolerated the procedure well. COMPLICATIONS: COMPLICATIONS None immediate FLUOROSCOPY: Radiation Exposure Index (as provided by the fluoroscopic device): 55 mGy Kerma COMPARISON:  None IMPRESSION: 1. Technically successful placement of tunneled right IJ hemodialysis catheter with ultrasound and fluoroscopic guidance. Ready for routine use. 2. Removal of tunneled left chest hemodialysis catheter ACCESS: Remains approachable for percutaneous intervention as needed. Electronically Signed   By: Lucrezia Europe M.D.   On: 05/24/2021 14:13   DG Chest Port 1 View  Result Date: 06/10/2021 CLINICAL DATA:  Hemodialysis patient with shortness of breath, did not finish dialysis session two days ago due to foot pain. EXAM: PORTABLE CHEST 1 VIEW COMPARISON:  Portable chest 05/16/2021. FINDINGS: The heart is moderately enlarged. Interval removal left IJ dialysis catheter with interval insertion of a right IJ dialysis catheter with tip in the right atrium interval removal right IJ central line. There is increased perihilar vascular congestion, mild increased interstitial consolidation consistent with edema and development of small pleural effusions. There is a stable mediastinum with calcification in the aortic arch, mild aortic tortuosity. Perihilar hazy opacities are also present most likely  ground-glass edema, less likely pneumonitis. IMPRESSION: Cardiomegaly with perihilar vascular congestion and bilateral interstitial edema with small pleural effusions, findings consistent with CHF or fluid overload. Additional perihilar opacities probably due to ground-glass edema, less likely infectious process. Clinical correlation and radiographic follow-up recommended. Electronically Signed   By: Telford Nab M.D.   On: 06/10/2021 22:15   DG Chest Portable 1 View  Result Date: 05/16/2021 CLINICAL DATA:  IV access EXAM: PORTABLE CHEST 1 VIEW COMPARISON:  05/16/2021 FINDINGS: Left dialysis catheter with tip over the cavoatrial junction region, unchanged. New right central venous catheter is present with tip over the low SVC region. No pneumothorax. Cardiac enlargement. No vascular congestion, edema, or consolidation. Mediastinal contours appear intact. IMPRESSION: Appliances appear in satisfactory position.  Cardiac enlargement. Electronically Signed   By: Lucienne Capers M.D.   On: 05/16/2021 19:30   DG Chest Port 1 View  Result Date: 05/16/2021 CLINICAL DATA:  Shortness of breath, chest pain EXAM: PORTABLE CHEST 1 VIEW COMPARISON:  Previous studies including the examination of 04/22/2021 FINDINGS: Transverse diameter of heart is increased. Central pulmonary vessels are prominent without signs of alveolar pulmonary edema. There is no focal pulmonary consolidation. Left lateral CP angle is indistinct. There is questionable minimal blunting of right lateral CP angle. There is no pneumothorax. IMPRESSION: Cardiomegaly. Central pulmonary vessels are prominent suggesting mild CHF. There are no signs of alveolar pulmonary edema or focal pulmonary consolidation. Small bilateral pleural effusions, more so on the left side. Electronically Signed   By: Elmer Picker M.D.   On: 05/16/2021 12:11   DG Foot 2 Views Left  Result Date: 05/17/2021 CLINICAL DATA:  Pain and swelling EXAM: LEFT FOOT - 2 VIEW  COMPARISON:  None. FINDINGS: No fracture or dislocation is seen. Extensive arterial calcifications are seen. Plantar spur is seen in calcaneus. There is mild hallux valgus deformity. Cortical irregularity in the medial aspect of head of the first metatarsal may be residual from previous injury. IMPRESSION: No fracture or dislocation is seen. There are no focal lytic lesions. Plantar spur is seen in calcaneus. Electronically Signed   By: Elmer Picker M.D.   On: 05/17/2021 13:41   DG Foot 2 Views Right  Result Date: 05/17/2021 CLINICAL DATA:  Pain and swelling EXAM: RIGHT FOOT - 2 VIEW COMPARISON:  04/23/2021 FINDINGS: No fracture or dislocation is seen. Plantar spur is seen in calcaneus. Degenerative changes are noted in the intertarsal joints with bony spurs in the dorsal aspect. Extensive arterial calcifications are seen in the soft tissues. There is mild hallux valgus deformity. IMPRESSION: No fracture or dislocation is seen. There are no focal lytic lesions. Plantar spur is seen in calcaneus. Electronically Signed   By: Elmer Picker M.D.   On: 05/17/2021 13:43   IR PTA VENOUS EXCEPT DIALYSIS CIRCUIT  Result Date: 05/13/2021 INDICATION: 51 year old female with a history of end-stage renal disease on hemodialysis via a left IJ 28 cm palindrome hemodialysis catheter. This catheter was last exchanged on 02/20/2021 at which time balloon disruption of an underlying fibrin sheath was performed. The catheter then worked well until recently when she began experience positional dependence and poor flows during hemodialysis. She presents for catheter evaluation with possible repeat balloon disruption and replacement. Of note, in the future the patient would like to switch to a right sided hemodialysis catheter. However, she was not prepared for moderate sedation today. EXAM: IR RIGHT FLUORO GUIDE CV LINE; IR PTA VENOUS EXCEPT DIALYSIS CIRCUIT MEDICATIONS: None. ANESTHESIA/SEDATION: None. FLUOROSCOPY  TIME:  145 mGy air kerma COMPLICATIONS: None immediate. PROCEDURE: Informed written consent was obtained from the patient after a thorough discussion of the procedural risks, benefits and alternatives. All questions were addressed. Maximal Sterile Barrier Technique was utilized including caps, mask, sterile gowns, sterile gloves, sterile drape, hand hygiene and skin antiseptic. A timeout was performed prior to the initiation of the procedure. Initial spot fluoroscopic imaging was performed. The 28 cm palindrome tunneled hemodialysis catheter is relatively well position with the catheter tip overlying the upper right atrium. The venous port flushes and aspirates easily. The arterial port could not be aspirated. Contrast injection was first performed through the venous port. This demonstrates normal filling of the right atrium. Local anesthesia was attained by infiltration with 1% lidocaine. The retention cuff was released from the surrounding soft tissues using sharp dissection. A stiff glidewire was advanced through the venous lumen, through the right heart and into the inferior vena cava. As the catheter was manipulated, aspiration was maintained on the  arterial port in till there was successful aspiration of the indwelling heparin and blood. At this time, contrast was injected through the arterial lumen under digital subtraction angiography. There is clear evidence of a fibrin sheath along the distal aspect of the catheter. This is likely the underlying source of catheter malfunction. The catheter was removed. A 16 mm atlas balloon was advanced over the wire and positioned in the region of the fibrin sheath. The balloon was then inflated to full effacement disrupting the fibrin sheath. The balloon was removed. A new 28 cm palindrome tunneled hemodialysis catheter was advanced over the wire and position with the tip in the right mid atrium. Both the arterial and venous lumens flush and aspirate easily. The catheter  was flushed with saline in the lumens locked with 2.1 mL heparin. The catheter was capped and secured to the skin with 0 Prolene suture. Sterile bandages were applied. The patient tolerated the procedure well. IMPRESSION: 1. Fibrin sheath partially obstructing flow through the arterial lumen. 2. Successful balloon disruption of fibrin sheath. 3. Successful exchange for a new 28 cm palindrome tunneled hemodialysis catheter. Catheter tips are in the right mid atrium and the device is ready for immediate use. Electronically Signed   By: Jacqulynn Cadet M.D.   On: 05/13/2021 13:09   ECHOCARDIOGRAM COMPLETE  Result Date: 05/17/2021    ECHOCARDIOGRAM REPORT   Patient Name:   HILDA RYNDERS Date of Exam: 05/17/2021 Medical Rec #:  [id number]       Height:       65.0 in Accession #:    [id number]      Weight:       375.2 lb Date of Birth:  28-Jan-1971        BSA:          2.585 m Patient Age:    30 years        BP:           127/83 mmHg Patient Gender: F               HR:           70 bpm. Exam Location:  Inpatient Procedure: 2D Echo, Cardiac Doppler, Color Doppler and Intracardiac            Opacification Agent Indications:    NSTEMI  History:        Patient has prior history of Echocardiogram examinations, most                 recent 04/23/2021. Risk Factors:Hypertension, Diabetes and                 Dyslipidemia. ESRD.  Sonographer:    Clayton Lefort RDCS (AE) Referring Phys: [id number] Leanne Chang [facility]  Sonographer Comments: Patient is morbidly obese. Image acquisition challenging due to patient body habitus. IMPRESSIONS  1. Left ventricular ejection fraction, by estimation, is 55 to 60%. The left ventricle has normal function. The left ventricle has no regional wall motion abnormalities. There is moderate concentric left ventricular hypertrophy. Left ventricular diastolic parameters are consistent with Grade II diastolic dysfunction (pseudonormalization). Elevated left atrial pressure.  2. Right ventricular systolic  function is normal. The right ventricular size is normal. Tricuspid regurgitation signal is inadequate for assessing PA pressure.  3. Left atrial size was mildly dilated.  4. The mitral valve is degenerative. Mild mitral valve regurgitation. No evidence of mitral stenosis. The mean mitral valve gradient is 3.0 mmHg with average heart rate of 68 bpm.  Moderate to severe mitral annular calcification.  5. The aortic valve is tricuspid. There is moderate calcification of the aortic valve. There is moderate thickening of the aortic valve. Aortic valve regurgitation is not visualized. Mild aortic valve stenosis. Aortic valve mean gradient measures 13.0 mmHg. Aortic valve Vmax measures 2.45 m/s. Comparison(s): Prior images reviewed side by side. There is now mild aortic stenosis. FINDINGS  Left Ventricle: Left ventricular ejection fraction, by estimation, is 55 to 60%. The left ventricle has normal function. The left ventricle has no regional wall motion abnormalities. Definity contrast agent was given IV to delineate the left ventricular  endocardial borders. The left ventricular internal cavity size was normal in size. There is moderate concentric left ventricular hypertrophy. Left ventricular diastolic parameters are consistent with Grade II diastolic dysfunction (pseudonormalization).  Elevated left atrial pressure. Right Ventricle: The right ventricular size is normal. No increase in right ventricular wall thickness. Right ventricular systolic function is normal. Tricuspid regurgitation signal is inadequate for assessing PA pressure. Left Atrium: Left atrial size was mildly dilated. Right Atrium: Right atrial size was normal in size. Pericardium: There is no evidence of pericardial effusion. Mitral Valve: The mitral valve is degenerative in appearance. Moderate to severe mitral annular calcification. Mild mitral valve regurgitation. No evidence of mitral valve stenosis. MV peak gradient, 6.7 mmHg. The mean mitral valve  gradient is 3.0 mmHg with average heart rate of 68 bpm. Tricuspid Valve: The tricuspid valve is normal in structure. Tricuspid valve regurgitation is trivial. Aortic Valve: The aortic valve is tricuspid. There is moderate calcification of the aortic valve. There is moderate thickening of the aortic valve. Aortic valve regurgitation is not visualized. Mild aortic stenosis is present. Aortic valve mean gradient measures 13.0 mmHg. Aortic valve peak gradient measures 24.0 mmHg. Aortic valve area, by VTI measures 1.58 cm. Pulmonic Valve: The pulmonic valve was grossly normal. Pulmonic valve regurgitation is not visualized. Aorta: The aortic root is normal in size and structure. IAS/Shunts: No atrial level shunt detected by color flow Doppler.  LEFT VENTRICLE PLAX 2D LVIDd:         5.00 cm   Diastology LVIDs:         3.40 cm   LV e' medial:    5.70 cm/s LV PW:         1.60 cm   LV E/e' medial:  20.7 LV IVS:        1.80 cm   LV e' lateral:   7.58 cm/s LVOT diam:     2.16 cm   LV E/e' lateral: 15.6 LV SV:         81 LV SV Index:   31 LVOT Area:     3.66 cm  RIGHT VENTRICLE RV Basal diam:  3.30 cm RV S prime:     17.70 cm/s TAPSE (M-mode): 3.3 cm LEFT ATRIUM              Index        RIGHT ATRIUM           Index LA diam:        3.00 cm  1.16 cm/m   RA Area:     14.60 cm LA Vol (A2C):   104.0 ml 40.24 ml/m  RA Volume:   37.00 ml  14.32 ml/m LA Vol (A4C):   67.4 ml  26.08 ml/m LA Biplane Vol: 83.6 ml  32.35 ml/m  AORTIC VALVE AV Area (Vmax):    1.47 cm AV Area (Vmean):  1.41 cm AV Area (VTI):     1.58 cm AV Vmax:           245.00 cm/s AV Vmean:          168.000 cm/s AV VTI:            0.516 m AV Peak Grad:      24.0 mmHg AV Mean Grad:      13.0 mmHg LVOT Vmax:         98.50 cm/s LVOT Vmean:        64.600 cm/s LVOT VTI:          0.222 m LVOT/AV VTI ratio: 0.43  AORTA Ao Root diam: 3.30 cm Ao Asc diam:  3.40 cm MITRAL VALVE MV Area (PHT): 2.41 cm     SHUNTS MV Peak grad:  6.7 mmHg     Systemic VTI:  0.22 m MV Mean  grad:  3.0 mmHg     Systemic Diam: 2.16 cm MV Vmax:       1.29 m/s MV Vmean:      83.9 cm/s MV Decel Time: 315 msec MV E velocity: 118.00 cm/s MV A velocity: 108.00 cm/s MV E/A ratio:  1.09 Mihai Croitoru MD Electronically signed by Sanda Klein MD Signature Date/Time: 05/17/2021/1:29:46 PM    Final    [place] EKG SITE RITE  Result Date: 05/16/2021 If Site Rite image not attached, placement could not be confirmed due to current cardiac rhythm.    Subjective: Pt sitting up in recliner, calm comfortable, no distress.  No chest pain or SOB complaints.    Discharge Exam: Vitals:   06/11/21 0926 06/11/21 1052  BP: (!) 166/95 (!) 110/43  Pulse:  63  Resp: (!) 21 15  Temp:    SpO2:  94%   Vitals:   06/11/21 0830 06/11/21 0845 06/11/21 0926 06/11/21 1052  BP: (!) 174/70  (!) 166/95 (!) 110/43  Pulse: 70 71  63  Resp: (!) 22 15 (!) 21 15  Temp:      TempSrc:      SpO2: 99% 100%  94%  Weight:      Height:       General: Pt is morbidly obese, alert, awake, not in acute distress Cardiovascular: distant heart sounds Respiratory: no increased work of breathing,  no wheezing, no rhonchi Abdominal: Soft, NT, ND, bowel sounds + Extremities:  no cyanosis   The results of significant diagnostics from this hospitalization (including imaging, microbiology, ancillary and laboratory) are listed below for reference.     Microbiology: Recent Results (from the past 240 hour(s))  Resp Panel by RT-PCR (Flu A&B, Covid) Nasopharyngeal Swab     Status: None   Collection Time: 06/10/21 10:40 PM   Specimen: Nasopharyngeal Swab; Nasopharyngeal(NP) swabs in vial transport medium  Result Value Ref Range Status   SARS Coronavirus 2 by RT PCR NEGATIVE NEGATIVE Final    Comment: (NOTE) SARS-CoV-2 target nucleic acids are NOT DETECTED.  The SARS-CoV-2 RNA is generally detectable in upper respiratory specimens during the acute phase of infection. The lowest concentration of SARS-CoV-2 viral copies this assay can  detect is 138 copies/mL. A negative result does not preclude SARS-Cov-2 infection and should not be used as the sole basis for treatment or other patient management decisions. A negative result may occur with  improper specimen collection/handling, submission of specimen other than nasopharyngeal swab, presence of viral mutation(s) within the areas targeted by this assay, and inadequate number of viral copies(<138 copies/mL). A negative  result must be combined with clinical observations, patient history, and epidemiological information. The expected result is Negative.  Fact Sheet for Patients:  EntrepreneurPulse.com.au  Fact Sheet for Healthcare Providers:  IncredibleEmployment.be  This test is no t yet approved or cleared by the [place] FDA and  has been authorized for detection and/or diagnosis of SARS-CoV-2 by FDA under an Emergency Use Authorization (EUA). This EUA will remain  in effect (meaning this test can be used) for the duration of the COVID-19 declaration under Section 564(b)(1) of the Act, 21 U.S.C.section 360bbb-3(b)(1), unless the authorization is terminated  or revoked sooner.       Influenza A by PCR NEGATIVE NEGATIVE Final   Influenza B by PCR NEGATIVE NEGATIVE Final    Comment: (NOTE) The Xpert Xpress SARS-CoV-2/FLU/RSV plus assay is intended as an aid in the diagnosis of influenza from Nasopharyngeal swab specimens and should not be used as a sole basis for treatment. Nasal washings and aspirates are unacceptable for Xpert Xpress SARS-CoV-2/FLU/RSV testing.  Fact Sheet for Patients: EntrepreneurPulse.com.au  Fact Sheet for Healthcare Providers: IncredibleEmployment.be  This test is not yet approved or cleared by the [place] FDA and has been authorized for detection and/or diagnosis of SARS-CoV-2 by FDA under an Emergency Use Authorization (EUA). This EUA will remain in  effect (meaning this test can be used) for the duration of the COVID-19 declaration under Section 564(b)(1) of the Act, 21 U.S.C. section 360bbb-3(b)(1), unless the authorization is terminated or revoked.  Performed at [facility], [address] [place], [place] [address]      Labs: BNP (last 3 results) No results for input(s): BNP in the last 8760 hours. Basic Metabolic Panel: Recent Labs  Lab 06/10/21 2124 06/11/21 0825  NA 132* 135  K 6.1* 3.8  CL 93* 93*  CO2 20* 27  GLUCOSE 325* 94  BUN 89* 44*  CREATININE 12.41* 7.46*  CALCIUM 9.0 9.1  MG  --  2.0   Liver Function Tests: Recent Labs  Lab 06/11/21 0825  AST 14*  ALT 14  ALKPHOS 65  BILITOT 0.8  PROT 8.8*  ALBUMIN 3.9   No results for input(s): LIPASE, AMYLASE in the last 168 hours. No results for input(s): AMMONIA in the last 168 hours. CBC: Recent Labs  Lab 06/10/21 2124 06/11/21 0825  WBC 18.9* 17.4*  NEUTROABS 17.2* 14.3*  HGB 7.9* 8.0*  HCT 27.5* 27.2*  MCV 101.1* 98.9  PLT 222 226   Cardiac Enzymes: No results for input(s): CKTOTAL, CKMB, CKMBINDEX, TROPONINI in the last 168 hours. BNP: Invalid input(s): POCBNP CBG: Recent Labs  Lab 06/11/21 0116 06/11/21 0425 06/11/21 0801  GLUCAP 251* 107* 87   D-Dimer No results for input(s): DDIMER in the last 72 hours. Hgb A1c No results for input(s): HGBA1C in the last 72 hours. Lipid Profile No results for input(s): CHOL, HDL, LDLCALC, TRIG, CHOLHDL, LDLDIRECT in the last 72 hours. Thyroid function studies Recent Labs    06/11/21 0825  TSH 3.935   Anemia work up No results for input(s): VITAMINB12, FOLATE, FERRITIN, TIBC, IRON, RETICCTPCT in the last 72 hours. Urinalysis No results found for: COLORURINE, APPEARANCEUR, [place], [place], [place], [place], [place], [place], PROTEINUR, UROBILINOGEN, NITRITE, LEUKOCYTESUR Sepsis Labs Invalid input(s): PROCALCITONIN,  WBC,  LACTICIDVEN Microbiology Recent Results (from the past 240  hour(s))  Resp Panel by RT-PCR (Flu A&B, Covid) Nasopharyngeal Swab     Status: None   Collection Time: 06/10/21 10:40 PM   Specimen: Nasopharyngeal Swab; Nasopharyngeal(NP) swabs in vial  transport medium  Result Value Ref Range Status   SARS Coronavirus 2 by RT PCR NEGATIVE NEGATIVE Final    Comment: (NOTE) SARS-CoV-2 target nucleic acids are NOT DETECTED.  The SARS-CoV-2 RNA is generally detectable in upper respiratory specimens during the acute phase of infection. The lowest concentration of SARS-CoV-2 viral copies this assay can detect is 138 copies/mL. A negative result does not preclude SARS-Cov-2 infection and should not be used as the sole basis for treatment or other patient management decisions. A negative result may occur with  improper specimen collection/handling, submission of specimen other than nasopharyngeal swab, presence of viral mutation(s) within the areas targeted by this assay, and inadequate number of viral copies(<138 copies/mL). A negative result must be combined with clinical observations, patient history, and epidemiological information. The expected result is Negative.  Fact Sheet for Patients:  EntrepreneurPulse.com.au  Fact Sheet for Healthcare Providers:  IncredibleEmployment.be  This test is no t yet approved or cleared by the [place] FDA and  has been authorized for detection and/or diagnosis of SARS-CoV-2 by FDA under an Emergency Use Authorization (EUA). This EUA will remain  in effect (meaning this test can be used) for the duration of the COVID-19 declaration under Section 564(b)(1) of the Act, 21 U.S.C.section 360bbb-3(b)(1), unless the authorization is terminated  or revoked sooner.       Influenza A by PCR NEGATIVE NEGATIVE Final   Influenza B by PCR NEGATIVE NEGATIVE Final    Comment: (NOTE) The Xpert Xpress SARS-CoV-2/FLU/RSV plus assay is intended as an aid in the diagnosis of influenza from  Nasopharyngeal swab specimens and should not be used as a sole basis for treatment. Nasal washings and aspirates are unacceptable for Xpert Xpress SARS-CoV-2/FLU/RSV testing.  Fact Sheet for Patients: EntrepreneurPulse.com.au  Fact Sheet for Healthcare Providers: IncredibleEmployment.be  This test is not yet approved or cleared by the [place] FDA and has been authorized for detection and/or diagnosis of SARS-CoV-2 by FDA under an Emergency Use Authorization (EUA). This EUA will remain in effect (meaning this test can be used) for the duration of the COVID-19 declaration under Section 564(b)(1) of the Act, 21 U.S.C. section 360bbb-3(b)(1), unless the authorization is terminated or revoked.  Performed at [facility], [address] [place], [place] [address]     Time coordinating discharge:   SIGNED:  Irwin Brakeman, MD  [facility] 06/11/2021, 11:43 AM How to contact the [facility] Attending or Consulting provider [place] or covering provider during after hours [place], for this patient?  Check the care team in [facility] and look for a) attending/consulting [facility] provider listed and b) the [facility] team listed Log into [email or web address] and use [place]'s universal password to access. If you do not have the password, please contact the hospital operator. Locate the [facility] provider you are looking for under [facility] and page to a number that you can be directly reached. If you still have difficulty reaching the provider, please page the [facility] (Director on Call) for the Hospitalists listed on amion for assistance.

## 2021-06-11 NOTE — ED Provider Notes (Signed)
[facility] EMERGENCY DEPARTMENT Provider Note   CSN: [id number] Arrival date & time: 06/10/21  1938     History  Chief Complaint  Patient presents with   Shortness of Breath    Courtney Gray is a 51 y.o. female.  HPI      51 y.o. female with medical history significant of morbid obesity, diabetes mellitus type 2, hyperlipidemia, hypertension, ESRD, and more presents ED with a chief complaint of shortness of breath.  Patient indicates that because of her gout, she did not get her complete hemodialysis on Saturday.  Yesterday she started noticing some shortness of breath, but today the shortness of breath is progressed.  She is having worsening shortness of breath when laying down.  She denies any associated chest pain.  Home Medications Prior to Admission medications   Medication Sig Start Date End Date Taking? Authorizing Provider  acetaminophen (TYLENOL) 500 MG tablet Take 1,000 mg by mouth every 6 (six) hours as needed for moderate pain or headache.   Yes [provider]  amLODipine (NORVASC) 10 MG tablet Take 1 tablet (10 mg total) by mouth daily. Patient taking differently: Take 10 mg by mouth daily as needed (Blood pressusre). 11/29/20  Yes Roxan Hockey, MD  aspirin EC 81 MG tablet Take 1 tablet (81 mg total) by mouth daily with breakfast. 11/28/20  Yes Emokpae, Courage, MD  atorvastatin (LIPITOR) 10 MG tablet Take 1 tablet (10 mg total) by mouth daily. Patient taking differently: Take 10 mg by mouth at bedtime. 11/28/20  Yes Emokpae, Courage, MD  calcium carbonate (TUMS SMOOTHIES) 750 MG chewable tablet Chew 750-1,500 mg by mouth See admin instructions. Take 1500 mg with each meal and 750 mg with each snack   Yes [provider]  carvedilol (COREG) 25 MG tablet Take 1 tablet (25 mg total) by mouth 2 (two) times daily. Patient taking differently: Take 25 mg by mouth 2 (two) times daily. Do not take on dialysis days Tuesday Thur. And Sat. Take as needed.  Take if blood pressure is over 140 11/28/20  Yes Emokpae, Courage, MD  diphenhydrAMINE (BENADRYL) 25 MG tablet Take 25 mg by mouth daily as needed for allergies or sleep.   Yes [provider]  docusate sodium (COLACE) 100 MG capsule Take 100 mg by mouth 2 (two) times daily.   Yes [provider]  gabapentin (NEURONTIN) 100 MG capsule Take 100-200 mg by mouth See admin instructions. 1 tablet in the morning and 1 tablet around 5pm then take 1 tablet at bedtime. Dialysis days take 2 capsules at 5pm 04/20/19  Yes [provider]  glipiZIDE (GLUCOTROL XL) 2.5 MG 24 hr tablet Take 2.5 mg by mouth daily. 03/22/21  Yes [provider]  HYDROcodone-acetaminophen (NORCO/VICODIN) 5-325 MG tablet Take 1 tablet by mouth 3 (three) times daily as needed. 05/29/21  Yes [provider]  LANTUS SOLOSTAR 100 UNIT/ML Solostar Pen Inject 12 Units into the skin at bedtime. 03/18/19  Yes [provider]  loperamide (IMODIUM A-D) 2 MG tablet Take 4 mg by mouth as needed for diarrhea or loose stools.   Yes [provider]  nitroGLYCERIN (NITROSTAT) 0.4 MG SL tablet Place 1 tablet (0.4 mg total) under the tongue every 5 (five) minutes as needed for chest pain. 05/24/21  Yes [place] Eric J, DO  pantoprazole (PROTONIX) 40 MG tablet Take 1 tablet (40 mg total) by mouth daily. 11/29/20  Yes Emokpae, Courage, MD  Phenol (CHLORASEPTIC MT) Use as directed 4 sprays in  the mouth or throat 4 (four) times daily as needed (throat pain).   Yes [provider]  predniSONE (STERAPRED UNI-PAK 21 TAB) 5 MG (21) TBPK tablet Take 5 mg by mouth See admin instructions. Take as directed 6,5,4 ,3,2,1 06/07/21  Yes [provider]  PROCTOZONE-HC 2.5 % rectal cream Place 1 application. rectally daily as needed for hemorrhoids. 05/29/21  Yes [provider]  simethicone (MYLICON) 284 MG chewable tablet Chew 125 mg by mouth 2 (two) times daily as needed for flatulence.    Yes [provider]  tiZANidine (ZANAFLEX) 4 MG tablet Take 4 mg by mouth every 8 (eight) hours. Take with gabapentin at same time 04/20/19  Yes [provider]  doxercalciferol (HECTOROL) 4 MCG/2ML injection Inject 2.5 mLs (5 mcg total) into the vein Every Tuesday,Thursday,and Saturday with dialysis. Patient not taking: Reported on 06/10/2021 09/01/20   Enzo Bi, MD      Allergies    Benzonatate, Hydralazine, Amoxicillin, Clonidine, Morphine, and Oxycodone    Review of Systems   Review of Systems  All other systems reviewed and are negative.  Physical Exam Updated Vital Signs BP (!) 194/71 (BP Location: Right Arm)    Pulse 68    Temp 98.3 F (36.8 C) (Oral)    Resp 18    Ht 5\' 5"  (1.651 m)    Wt (!) 165.8 kg    SpO2 97%    BMI 60.83 kg/m  Physical Exam Vitals and nursing note reviewed.  Constitutional:      Appearance: She is well-developed.  HENT:     Head: Atraumatic.  Cardiovascular:     Rate and Rhythm: Normal rate.  Pulmonary:     Effort: Pulmonary effort is normal. Tachypnea present.     Breath sounds: Examination of the right-lower field reveals rales. Examination of the left-lower field reveals rales. Rales present.     Comments: Mild respiratory distress Musculoskeletal:     Cervical back: Normal range of motion and neck supple.  Skin:    General: Skin is warm and dry.  Neurological:     Mental Status: She is alert and oriented to person, place, and time.    ED Results / Procedures / Treatments   Labs (all labs ordered are listed, but only abnormal results are displayed) Labs Reviewed  BASIC METABOLIC PANEL - Abnormal; Notable for the following components:      Result Value   Sodium 132 (*)    Potassium 6.1 (*)    Chloride 93 (*)    CO2 20 (*)    Glucose, Bld 325 (*)    BUN 89 (*)    Creatinine, Ser 12.41 (*)    GFR, Estimated 3 (*)    Anion gap 19 (*)    All other components within normal limits  CBC WITH DIFFERENTIAL/PLATELET -  Abnormal; Notable for the following components:   WBC 18.9 (*)    RBC 2.72 (*)    Hemoglobin 7.9 (*)    HCT 27.5 (*)    MCV 101.1 (*)    MCHC 28.7 (*)    RDW 15.9 (*)    Neutro Abs 17.2 (*)    Abs Immature Granulocytes 0.23 (*)    All other components within normal limits  RESP PANEL BY RT-PCR (FLU A&B, COVID) [id number]    EKG EKG Interpretation  Date/Time:  Monday June 10 2021 21:29:55 EST Ventricular Rate:  73 PR Interval:  168 QRS Duration: 117 QT Interval:  419 QTC Calculation: 462  R Axis:   89 Text Interpretation: Sinus rhythm Nonspecific intraventricular conduction delay Minimal ST depression, inferior leads No acute changes Confirmed by Varney Biles [phone]) on 06/10/2021 10:41:25 PM  Radiology DG Chest Port 1 View  Result Date: 06/10/2021 CLINICAL DATA:  Hemodialysis patient with shortness of breath, did not finish dialysis session two days ago due to foot pain. EXAM: PORTABLE CHEST 1 VIEW COMPARISON:  Portable chest 05/16/2021. FINDINGS: The heart is moderately enlarged. Interval removal left IJ dialysis catheter with interval insertion of a right IJ dialysis catheter with tip in the right atrium interval removal right IJ central line. There is increased perihilar vascular congestion, mild increased interstitial consolidation consistent with edema and development of small pleural effusions. There is a stable mediastinum with calcification in the aortic arch, mild aortic tortuosity. Perihilar hazy opacities are also present most likely ground-glass edema, less likely pneumonitis. IMPRESSION: Cardiomegaly with perihilar vascular congestion and bilateral interstitial edema with small pleural effusions, findings consistent with CHF or fluid overload. Additional perihilar opacities probably due to ground-glass edema, less likely infectious process. Clinical correlation and radiographic follow-up recommended. Electronically Signed   By: Telford Nab M.D.   On: 06/10/2021 22:15     Procedures .Critical Care Performed by: Varney Biles, MD Authorized by: Varney Biles, MD   Critical care provider statement:    Critical care time (minutes):  11   Critical care was necessary to treat or prevent imminent or life-threatening deterioration of the following conditions:  Metabolic crisis and renal failure   Critical care was time spent personally by me on the following activities:  Development of treatment plan with patient or surrogate, discussions with consultants, evaluation of patient's response to treatment, examination of patient, ordering and review of laboratory studies, ordering and review of radiographic studies, ordering and performing treatments and interventions, pulse oximetry, re-evaluation of patient's condition, review of old charts and obtaining history from patient or surrogate    Medications Ordered in ED Medications  insulin aspart (novoLOG) injection 5 Units (has no administration in time range)    And  dextrose 50 % solution 50 mL (has no administration in time range)  sodium zirconium cyclosilicate (LOKELMA) packet 10 g (has no administration in time range)  nitroGLYCERIN 50 mg in dextrose 5 % 250 mL (0.2 mg/mL) infusion (has no administration in time range)  Chlorhexidine Gluconate Cloth 2 % PADS 6 each (has no administration in time range)    ED Course/ Medical Decision Making/ A&P                           Medical Decision Making Amount and/or Complexity of Data Reviewed Labs: ordered. Radiology: ordered.  Risk OTC drugs. Prescription drug management. Decision regarding hospitalization.    This patient presents to the ED with chief complaint(s) of shortness of breath with pertinent past medical history of ESRD on HD, diabetes, morbid obesity and congestive heart failure which further complicates the presenting complaint. The complaint involves an extensive differential diagnosis and treatment options and also carries with it a high  risk of complications and morbidity.    The differential diagnosis includes: Hypertensive emergency, pulmonary edema, CHF exacerbation, ACS, pleural effusion, pneumonia  The initial plan is to get x-ray of the chest and basic labs.  Had incomplete last session of dialysis.  Indicates that she is having orthopnea, paroxysmal nocturnal dyspnea like symptoms.  Shortness of breath is getting worse.  Noted to be hypertensive in the ER.  Discussed case with Dr. Carolin Sicks nephrology.  They will try to do dialysis soon as possible, but think it is less likely it will be done until morning.  We will start nitro drip and put patient on BiPAP. We will give Lokelma, insulin and D50 for hyperkalemia.  Additional history obtained: Records reviewed previous admission documents    Final Clinical Impression(s) / ED Diagnoses Final diagnoses:  Acute pulmonary edema [facility])    Rx / DC Orders ED Discharge Orders     None         Varney Biles, MD 06/11/21 2315

## 2021-06-11 NOTE — ED Notes (Signed)
Pt is requesting her potassium to be rechecked at this time. Pt was informed that it was checked this morning at 0825 and was normal. Pt seems to be satisfied with the explanation. ? ?

## 2021-06-11 NOTE — ED Notes (Signed)
AC speaking with the pt at this time re: pt concerns ?

## 2021-06-11 NOTE — Assessment & Plan Note (Addendum)
Blood pressures as high as 224/100 ?Continue to titrate nitro drip>>weaned off once pt took home meds ?Patient noncompliant with home meds ?

## 2021-06-11 NOTE — TOC Progression Note (Signed)
?  Transition of Care (TOC) Screening Note ? ? ?Patient Details  ?Name: Courtney Gray ?Date of Birth: 08/19/1970 ? ? ?Transition of Care (TOC) CM/SW Contact:    ?Shade Flood, LCSW ?Phone Number: ?06/11/2021, 11:43 AM ? ? ? ?Transition of Care Department [facility]) has reviewed patient and no TOC needs have been identified at this time. We will continue to monitor patient advancement through interdisciplinary progression rounds. If new patient transition needs arise, please place a TOC consult. ? ? ?

## 2021-06-11 NOTE — Consult Note (Signed)
Courtney Gray Admit Date: 06/10/2021 06/11/2021 Rexene Agent Requesting Physician:  Murvin Natal MD  Reason for Consult:  ESRD, hypervolemia, SOB/AHRF, Hyperkalemia HPI:  11F ESRD Courtney Gray THS R IJ TDC, PMH including DM 2, obesity, hyperlipidemia, hypertension, presented yesterday to the ED with shortness of breath and edema.  Her last treatment was on 3/4 and she signed off early because of progressive pain in her foot, leaving by her report 2 kg above her EDW.  Upon presentation to the ED her blood pressures were elevated, she required 4 L nasal cannula (she uses home oxygen) and her potassium was 6.1.  She received dialysis already last night, having 3.9 L of UF.  She tells me that this started because of progressive pain in her right foot which has been treated as a gout flare.  She feels that the prednisone she is receiving increased her oral intake and also the pain caused her to sign off early.  The foot still hurts.  She does feel much improved this morning, having no dyspnea above her baseline.  Other notable labs include a hemoglobin of 8.0, WBC of 17.4 which could be related to use of corticosteroids, bicarbonate of 27.  She does not currently have a functioning fistula or graft.  ROS Denies fever, chills, no productive cough Balance of 12 systems is negative w/ exceptions as above  PMH  Past Medical History:  Diagnosis Date   Diabetes mellitus without complication (HCC)    High cholesterol    Hypertension    Renal disorder    PSH  Past Surgical History:  Procedure Laterality Date   AV FISTULA PLACEMENT     x2, unsuccessful.    IR FLUORO GUIDE CV LINE LEFT  12/14/2020   IR FLUORO GUIDE CV LINE LEFT  02/20/2021   IR FLUORO GUIDE CV LINE RIGHT  08/28/2020   IR FLUORO GUIDE CV LINE RIGHT  05/13/2021   IR FLUORO GUIDE CV LINE RIGHT  05/24/2021   IR PERC TUN PERIT CATH WO PORT S&I /IMAG  09/06/2020   IR PTA VENOUS EXCEPT DIALYSIS CIRCUIT  02/20/2021   IR PTA VENOUS EXCEPT DIALYSIS  CIRCUIT  05/13/2021   IR REMOVAL TUN CV CATH W/O FL  05/24/2021   IR US GUIDE VASC ACCESS LEFT  09/06/2020   IR US GUIDE VASC ACCESS RIGHT  05/24/2021   IR VENOCAVAGRAM SVC  02/20/2021   FH  Family History  Problem Relation Age of Onset   Diabetes Mother    Diabetes Father    SH  reports that she has never smoked. She has never used smokeless tobacco. She reports that she does not drink alcohol and does not use drugs. Allergies  Allergies  Allergen Reactions   Benzonatate Other (See Comments)    Hallucinations   Hydralazine Other (See Comments)    Hallucinations   Amoxicillin Nausea And Vomiting   Clonidine Other (See Comments)    Altered mental state   Morphine Itching   Oxycodone Itching   Home medications Prior to Admission medications   Medication Sig Start Date End Date Taking? Authorizing Provider  acetaminophen (TYLENOL) 500 MG tablet Take 1,000 mg by mouth every 6 (six) hours as needed for moderate pain or headache.   Yes [provider]  amLODipine (NORVASC) 10 MG tablet Take 1 tablet (10 mg total) by mouth daily. Patient taking differently: Take 10 mg by mouth daily as needed (Blood pressusre). 11/29/20  Yes Roxan Hockey, MD  aspirin EC 81 MG  tablet Take 1 tablet (81 mg total) by mouth daily with breakfast. 11/28/20  Yes Emokpae, Courage, MD  atorvastatin (LIPITOR) 10 MG tablet Take 1 tablet (10 mg total) by mouth daily. Patient taking differently: Take 10 mg by mouth at bedtime. 11/28/20  Yes Emokpae, Courage, MD  calcium carbonate (TUMS SMOOTHIES) 750 MG chewable tablet Chew 750-1,500 mg by mouth See admin instructions. Take 1500 mg with each meal and 750 mg with each snack   Yes [provider]  carvedilol (COREG) 25 MG tablet Take 1 tablet (25 mg total) by mouth 2 (two) times daily. Patient taking differently: Take 25 mg by mouth 2 (two) times daily. Do not take on dialysis days Tuesday Thur. And Sat. Take as needed. Take if blood pressure is over 140  11/28/20  Yes Emokpae, Courage, MD  diphenhydrAMINE (BENADRYL) 25 MG tablet Take 25 mg by mouth daily as needed for allergies or sleep.   Yes [provider]  docusate sodium (COLACE) 100 MG capsule Take 100 mg by mouth 2 (two) times daily.   Yes [provider]  gabapentin (NEURONTIN) 100 MG capsule Take 100-200 mg by mouth See admin instructions. 1 tablet in the morning and 1 tablet around 5pm then take 1 tablet at bedtime. Dialysis days take 2 capsules at 5pm 04/20/19  Yes [provider]  glipiZIDE (GLUCOTROL XL) 2.5 MG 24 hr tablet Take 2.5 mg by mouth daily. 03/22/21  Yes [provider]  HYDROcodone-acetaminophen (NORCO/VICODIN) 5-325 MG tablet Take 1 tablet by mouth 3 (three) times daily as needed. 05/29/21  Yes [provider]  LANTUS SOLOSTAR 100 UNIT/ML Solostar Pen Inject 12 Units into the skin at bedtime. 03/18/19  Yes [provider]  loperamide (IMODIUM A-D) 2 MG tablet Take 4 mg by mouth as needed for diarrhea or loose stools.   Yes [provider]  nitroGLYCERIN (NITROSTAT) 0.4 MG SL tablet Place 1 tablet (0.4 mg total) under the tongue every 5 (five) minutes as needed for chest pain. 05/24/21  Yes [place] Eric J, DO  pantoprazole (PROTONIX) 40 MG tablet Take 1 tablet (40 mg total) by mouth daily. 11/29/20  Yes Emokpae, Courage, MD  Phenol (CHLORASEPTIC MT) Use as directed 4 sprays in the mouth or throat 4 (four) times daily as needed (throat pain).   Yes [provider]  predniSONE (STERAPRED UNI-PAK 21 TAB) 5 MG (21) TBPK tablet Take 5 mg by mouth See admin instructions. Take as directed 6,5,4 ,3,2,1 06/07/21  Yes [provider]  PROCTOZONE-HC 2.5 % rectal cream Place 1 application. rectally daily as needed for hemorrhoids. 05/29/21  Yes [provider]  simethicone (MYLICON) 998 MG chewable tablet Chew 125 mg by mouth 2 (two) times daily as needed for flatulence.   Yes [provider]   tiZANidine (ZANAFLEX) 4 MG tablet Take 4 mg by mouth every 8 (eight) hours. Take with gabapentin at same time 04/20/19  Yes [provider]  doxercalciferol (HECTOROL) 4 MCG/2ML injection Inject 2.5 mLs (5 mcg total) into the vein Every Tuesday,Thursday,and Saturday with dialysis. Patient not taking: Reported on 06/10/2021 09/01/20   Enzo Bi, MD    Current Medications Scheduled Meds:  amLODipine  10 mg Oral Daily   aspirin EC  81 mg Oral Q breakfast   atorvastatin  10 mg Oral QHS   calcium carbonate  1,500 mg Oral TID with meals   carvedilol  25 mg Oral BID   Chlorhexidine Gluconate Cloth  6 each Topical Q0600  docusate sodium  100 mg Oral BID   gabapentin  100 mg Oral Daily   [START ON 06/12/2021] gabapentin  100 mg Oral Once per day on Sun Mon Wed Fri   gabapentin  200 mg Oral QHS   gabapentin  200 mg Oral Once per day on Tue Thu Sat   heparin  5,000 Units Subcutaneous Q8H   insulin aspart  0-6 Units Subcutaneous TID WC   insulin glargine-yfgn  8 Units Subcutaneous QHS   pantoprazole  40 mg Oral Daily   tiZANidine  4 mg Oral TID   Continuous Infusions:  sodium chloride     sodium chloride     nitroGLYCERIN     PRN Meds:.sodium chloride, sodium chloride, acetaminophen **OR** acetaminophen, alteplase, diphenhydrAMINE, fentaNYL (SUBLIMAZE) injection, heparin, heparin, nitroGLYCERIN, ondansetron **OR** ondansetron (ZOFRAN) IV, oxyCODONE, simethicone  CBC Recent Labs  Lab 06/10/21 2124 06/11/21 0825  WBC 18.9* 17.4*  NEUTROABS 17.2* 14.3*  HGB 7.9* 8.0*  HCT 27.5* 27.2*  MCV 101.1* 98.9  PLT 222 754   Basic Metabolic Panel Recent Labs  Lab 06/10/21 2124 06/11/21 0825  NA 132* 135  K 6.1* 3.8  CL 93* 93*  CO2 20* 27  GLUCOSE 325* 94  BUN 89* 44*  CREATININE 12.41* 7.46*  CALCIUM 9.0 9.1    Physical Exam   Blood pressure (!) 174/70, pulse 71, temperature 98.2 F (36.8 C), temperature source Oral, resp. rate 15, height 5\' 5"  (1.651 m), weight (!) 165.8  kg, SpO2 100 %. GEN: NAD, on supplemental O2, obese ENT: NCAT EYES: EOMI CV: Regular, normal S1 and S2 PULM: Diminished throughout, no crackles, normal work of breathing ABD: Significant adiposity, limited exam, soft SKIN: No rashes or lesions EXT: 1+ edema  Assessment 35F ESRD THS admitted with inc SOB, Hyperkalemia, Acute HTN.   ESRD THS R IJ Courtney Gray:  Presented with SOB, Inc K, s/p HD overnight 3.9L UF, symptoms improved, K now 3.8 Cont on THS schedule now, next Tx 3/9 inpatient or outpatient HTN/Vol: some scale weight discrepancy. Seems she needs to achieve her outpatient EDW Anemia, unclear history, trend, outpatient management Right foot pain, treated as gout, per primary Leukocytosis likely secondary to corticosteroids BMM: Calcium stable, no acute/inpatient issues  Plan No further dialysis today Probably safe to receive dialysis on her outpatient schedule 06/13/21 Will follow along   Rexene Agent  06/11/2021, 9:15 AM

## 2021-06-11 NOTE — ED Notes (Signed)
Requested MD to come speak with the pt prior to d/c ?

## 2021-06-11 NOTE — Assessment & Plan Note (Addendum)
Oxygen requirement up to 4 L ?Secondary to fluid overload and hypertensive emergency with pulmonary edema ?BiPAP ordered in the ED, but not started ?Patient comfortable on 4 L during dialysis at the time of my exam ?Continue nitro drip>>weaned off ?

## 2021-06-11 NOTE — Progress Notes (Signed)
BIPAP place in patient medium mask with 4lpm bleed in auto titrate mode. Patient tolerate well machine plugged in red outlet, patient in dialysis ?

## 2021-06-11 NOTE — ED Notes (Signed)
Courtney Gray, Pt experience, pt is speaking with her HD team to arrange HD tomorrow, per Courtney, [facility] will not transport to HD tomorrow if it is not scheduled by the dialysis service in [place], so the pt is working on that at this time, at this time the pt is waiting on a [employer] to determine if she could get an appeal for the hospital discharge which may take 2 days, at this time the pt is not admitted and is attempting to self arrange HD for tomorrow before being discharge home and will require transportation from this facility, Charge RN aware ?

## 2021-06-11 NOTE — Progress Notes (Signed)
Still waiting to set up patient ?

## 2021-06-11 NOTE — ED Notes (Signed)
Pt still in recliner ?

## 2021-06-11 NOTE — Assessment & Plan Note (Signed)
Leukocytosis 19 ?No consolidation on chest x-ray ?No other infectious symptoms ?Respiratory panel negative ?Likely related to recent gout flare and prednisone taper ?Trend in the a.m. ?

## 2021-06-11 NOTE — ED Notes (Addendum)
Returned to patient room and moved her to [address] to make room for an incoming emergent patient. Patient relocated to [address] with her belongings and oxygen. Patient states she is not being heard and wants to stay because she does not feel it is safe to go home due to her dry weight being 168 and she only was pulled to 173. She said she is on steroids for gout and that makes her thirsty and hungry and she feels this is contributing to her fluid overload. She said her doctor told her she should not stop them suddenly so she feels the steroids will cause her to be dialyzed more often while she is on them. She states he recently had a heart attack and was afraid to go home only to have to return tomorrow for dialysis and that transportation was difficult for her. She does not feel physicians have adequately explained her condition to her. D Dr. Richarda Blade spoke with patient about alternative to hospitalization and for her to consult with her nephrologist to determine if he feels she needs additional dialysis tomorrow. Patient was attempting to do this with A. Smith.  ?

## 2021-06-11 NOTE — Progress Notes (Signed)
06/11/2021 ?7:02 PM ? ?Pt is medically stable to discharge home and has been discharged.  I discussed with patient and with nephrologist who agreed that she can discharge home today.  She is on room air and pulse ox is 100%.  Pt refusing to go home. She also is refusing treatments in the hospital. She is refusing to take home meds, refusing DVT prophylaxis, etc.  I explained to her that because of her immunocompromised state it is much more risky for her to be holding in the ED hallway all day and night than for her to be home because of her risk of exposure to communicable disease. She insists on appealing discharge and is content to hold in the ED. We reordered her home steroid that she was taking for gout but she has refused to take it. We reviewed her labs with her and they are stable. Her potassium corrected after HD and her hemoglobin is stable.   Courtney Natal  MD ?

## 2021-06-11 NOTE — ED Notes (Signed)
Per off going paramedic, the pt just returned from HD, and meds were not given at the time ordered d/t pt not being available ?

## 2021-06-11 NOTE — ED Notes (Signed)
Pt moved from stretcher to bedside recliner, walker at bedside, pt tolerated well ?

## 2021-06-11 NOTE — ED Notes (Signed)
Patient Experience Advocate Courtney Gray and I went in to see th patient regarding her concern over being discharged. Patient states she is on the phone with CMS appealing her discharge. We were asked to wait and come back in 5 minutes. ?

## 2021-06-11 NOTE — Progress Notes (Signed)
Waiting to put patient on BIPAP, pt going to dialysis will setup there ?

## 2021-06-11 NOTE — Assessment & Plan Note (Deleted)
Oxygen requirement up to 4 L ?Secondary to pulmonary edema which is secondary to hypertensive emergency ?BiPAP ordered in the ED, but not started ?Patient comfortable on 4 L during dialysis at the time of my exam ?Continue nitro drip ?Continue to monitor ?

## 2021-06-11 NOTE — Assessment & Plan Note (Addendum)
Consulted nephrology ?Patient in dialysis at the time of my exam ?Trend chemistry, Phos, magnesium ?Received HD early am 3/7 ?

## 2021-06-11 NOTE — ED Notes (Signed)
pt received HD, pharmacist notified and discussed medications, pt not to receive 10 units at this time, will use sliding scale insulin, meds that were due last night was marked as not given d/t pt unavailable as she was receiving HD, the next due time will auto generate according to the Pharmacist, pt received Heparin with HD, per pharmacy next dose will need to be given this afternoon ?

## 2021-06-11 NOTE — Procedures (Signed)
? ?  EMERGENT HEMODIALYSIS TREATMENT NOTE: ? ? ?Ready to receive pt in HD unit at [place].  As of 0200, pt is refusing to come up due to pain in her foot.  I asked ED staff to relay to patient that I would not be coming to the ED to dialyze her as there is another patient currently receiving HD in our unit. ? ?___________________________________________________________________ ? ? ?Pt arrived at [place] [facility] and HD started at [place].  224/100, NSR 60s, saturating 96% on her baseline home O2 of 4L. Catheter entry site was unremarkable.  TDC tolerated a max flow of 350 while maintaining stable arterial pressures. Pt agreed to 4L goal over a 4 hour treatment.  1K bath was utilized for the first hour, then 2K.  Hypertensive throughout treatment and tolerating UF.  After 2.3 liters removed, pt asked to pause UF.  "I feel like if I move I'm going to cramp."  UF was paused for 25 minutes, then resumed with her permission.   ? ?4 hour session completed.  All blood was returned.  Net UF 3.9 L.  After HD, pt was returned to ED to await bed placement.  Dr. Clearence Ped still requests stepdown bed; she would like to start NTG gtt.  Above d/w Marylen Ponto, EMT-P in the ED. ? ? ?[name], RN ?

## 2021-06-11 NOTE — Progress Notes (Signed)
Pt refused BIPAP machine ?

## 2021-06-11 NOTE — ED Notes (Signed)
Lab notified to collect 5am labs ?

## 2021-06-11 NOTE — Assessment & Plan Note (Addendum)
Secondary to ESRD and partial dialysis at last session ?Initially Courtney Gray [facility] ordered ?Improved with HD ?

## 2021-06-11 NOTE — Progress Notes (Signed)
Inpatient Diabetes Program Recommendations ? ?AACE/ADA: New Consensus Statement on Inpatient Glycemic Control (2015) ? ?Target Ranges:  Prepandial:   less than 140 mg/dL ?     Peak postprandial:   less than 180 mg/dL (1-2 hours) ?     Critically ill patients:  140 - 180 mg/dL  ? ?Lab Results  ?Component Value Date  ? GLUCAP 87 06/11/2021  ? HGBA1C 8.9 (H) 04/23/2021  ? ? ?Review of Glycemic Control ? Latest Reference Range & Units 06/11/21 01:16 06/11/21 04:25 06/11/21 08:01  ?Glucose-Capillary 70 - 99 mg/dL 251 (H) 107 (H) 87  ? ?Diabetes history:  ?DM 2 ?Outpatient Diabetes medications:  ?Lantus 12 units q HS ?Glucotrol 2.5 mg daily ?Current orders for Inpatient glycemic control:  ?Novolog 0-6 units tid with meas ?Semglee 8 units q HS ? ?Inpatient Diabetes Program Recommendations:   ? ?Agree with current orders.  ? ?Thanks,  ?Adah Perl, RN, BC-ADM ?Inpatient Diabetes Coordinator ?Pager [phone]  (8a-5p) ? ? ?

## 2021-06-11 NOTE — Care Management Important Message (Signed)
Important Message ? ?Patient Details  ?Name: Courtney Gray ?MRN: [id number] ?Date of Birth: [birth year] ? ? ?Medicare Important Message Given:  Yes (late entry, copy given at 1:10pm 06/11/21) ? ? ? ? ?Tommy Medal ?06/11/2021, 2:34 PM ?

## 2021-06-12 DIAGNOSIS — E875 Hyperkalemia: Secondary | ICD-10-CM

## 2021-06-12 DIAGNOSIS — J9621 Acute and chronic respiratory failure with hypoxia: Secondary | ICD-10-CM

## 2021-06-12 DIAGNOSIS — Z992 Dependence on renal dialysis: Secondary | ICD-10-CM | POA: Diagnosis not present

## 2021-06-12 DIAGNOSIS — N186 End stage renal disease: Secondary | ICD-10-CM

## 2021-06-12 DIAGNOSIS — J810 Acute pulmonary edema: Secondary | ICD-10-CM | POA: Insufficient documentation

## 2021-06-12 DIAGNOSIS — I161 Hypertensive emergency: Secondary | ICD-10-CM

## 2021-06-12 DIAGNOSIS — E8779 Other fluid overload: Secondary | ICD-10-CM | POA: Diagnosis not present

## 2021-06-12 LAB — CBG MONITORING, ED
Glucose-Capillary: 253 mg/dL — ABNORMAL HIGH (ref 70–99)
Glucose-Capillary: 279 mg/dL — ABNORMAL HIGH (ref 70–99)

## 2021-06-12 LAB — HEMOGLOBIN AND HEMATOCRIT, BLOOD
HCT: 24.3 % — ABNORMAL LOW (ref 36.0–46.0)
Hemoglobin: 7.3 g/dL — ABNORMAL LOW (ref 12.0–15.0)

## 2021-06-12 LAB — MAGNESIUM: Magnesium: 2 mg/dL (ref 1.7–2.4)

## 2021-06-12 LAB — RENAL FUNCTION PANEL
Albumin: 3.4 g/dL — ABNORMAL LOW (ref 3.5–5.0)
Anion gap: 14 (ref 5–15)
BUN: 74 mg/dL — ABNORMAL HIGH (ref 6–20)
CO2: 22 mmol/L (ref 22–32)
Calcium: 8.5 mg/dL — ABNORMAL LOW (ref 8.9–10.3)
Chloride: 93 mmol/L — ABNORMAL LOW (ref 98–111)
Creatinine, Ser: 10.2 mg/dL — ABNORMAL HIGH (ref 0.44–1.00)
GFR, Estimated: 4 mL/min — ABNORMAL LOW (ref >60)
Glucose, Bld: 350 mg/dL — ABNORMAL HIGH (ref 70–99)
Phosphorus: 8.7 mg/dL — ABNORMAL HIGH (ref 2.5–4.6)
Potassium: 6.2 mmol/L — ABNORMAL HIGH (ref 3.5–5.1)
Sodium: 129 mmol/L — ABNORMAL LOW (ref 135–145)

## 2021-06-12 MED ORDER — INSULIN ASPART 100 UNIT/ML IJ SOLN: 3.0000 [IU] | Freq: Three times a day (TID) | INTRAMUSCULAR | Status: DC

## 2021-06-12 MED ORDER — SODIUM ZIRCONIUM CYCLOSILICATE 5 G PO PACK: 10.0000 g | PACK | Freq: Once | ORAL | Status: DC

## 2021-06-12 MED ORDER — CHLORHEXIDINE GLUCONATE CLOTH 2 % EX PADS: 6.0000 | MEDICATED_PAD | Freq: Every day | CUTANEOUS | Status: DC

## 2021-06-12 NOTE — ED Notes (Signed)
Pt given bag lunch to take with her ?

## 2021-06-12 NOTE — ED Notes (Signed)
[facility] called to transport pt to [place]. Informed by dispatch [facility] that he cancelled the transportation due to pt still being in the ED. Spoke with social worker [place], Tim Mertzon, and Tiffany DON for approval to set up transportation with [facility] to transport pt to [place] and home after her dialysis. Consent was given transportation set up with [facility] at this time. ?

## 2021-06-12 NOTE — TOC Progression Note (Signed)
Transition of Care (TOC) - Progression Note  ? ? ?Patient Details  ?Name: Courtney Gray ?MRN: [id number] ?Date of Birth: 21-Jan-1971 ? ?Transition of Care (TOC) CM/SW Contact  ?Salome Arnt, LCSW ?Phone Number: ?06/12/2021, 9:58 AM ? ?Clinical Narrative:  Per ED RN, pt plans to go to outpatient dialysis today and will drop appeal. ED has arranged transportation to and from dialysis today with [facility].   ? ? ? ?  ?Barriers to Discharge: Barriers Resolved ? ?Expected Discharge Plan and Services ?  ?  ?  ?  ?  ?Expected Discharge Date: 06/11/21               ?  ?  ?  ?  ?  ?  ?  ?  ?  ?  ? ? ?Social Determinants of Health (SDOH) Interventions ?  ? ?Readmission Risk Interventions ?Readmission Risk Prevention Plan 05/23/2021  ?Transportation Screening Complete  ?Medication Review Press photographer) Complete  ?PCP or Specialist appointment within 3-5 days of discharge Complete  ?[place] or Home Care Consult Complete  ?SW Recovery Care/Counseling Consult Complete  ?Palliative Care Screening Not Applicable  ?[place] Not Applicable  ? ? ?

## 2021-06-12 NOTE — ED Notes (Signed)
Pt states that gout is causing pain and that she would like something for it. Patient provided with 52mcg of Fentanyl  ?

## 2021-06-12 NOTE — ED Notes (Signed)
Pt upset that lab "woke her up, since I just got in a room and just got to sleep." Pt stating she does not want anymore blood draws and wants to be left alone. CBG and VS refused at this time.  ?

## 2021-06-12 NOTE — Progress Notes (Addendum)
[facility] ?NEPHROLOGY PROGRESS NOTE ? ?Assessment/ Plan: ?Pt is a 51 y.o. yo female  with HTN, DM, HLD, obesity, ESRD [name] TTS via right IJ TDC presents with shortness of breath, fluid overload, hyperkalemia and generalized weakness. ? ?#ESRD on HD TTS, patient with significant fluid overload, hyperkalemia.  I think patient will benefit from dialysis today.  Reportedly, the outpatient unit can take her around 11:00 today.  Patient is agreed to go to outpatient HD if transportation can be arranged and I think this is reasonable for her and earliest she can receive HD. ?If she remains in the hospital today for some reason then, I will go ahead and order 3 hours of treatment.  She will need regular HD tomorrow, hopefully outpatient. ? ?It was discussed with the patient, ER nurse, dialysis nurse and Dr. Carles Collet. ? ?#Hyperkalemia: Seems to be chronic issue.  Order a dose of Lokelma.  HD today. ? ?# Anemia of ESRD: Hemoglobin down trended probably dilution.  Unclear if she received ESA outpatient.   ? 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  Need to take binders and manage outpatient. ? ?# HTN/volume: Significant volume overload.  The UF as tolerated.  Discussed about salt and fluid restriction.  Continue antihypertensives. ? ?#Hyponatremia, hypervolemic: Managed with dialysis and ultrafiltration. ? ?Subjective: Seen and examined in the ER.  Patient is concerned about significant fluid overload.  Reports baseline shortness of breath.  Denies nausea vomiting or chest pain. ?Objective ?Vital signs in last 24 hours: ?Vitals:  ? 06/11/21 1300 06/11/21 1330 06/11/21 1600 06/12/21 0859  ?BP: 131/68 (!) 147/72 (!) 204/70 (!) 187/65  ?Pulse:   70 72  ?Resp: (!) 25 (!) 26 20 20   ?Temp:      ?TempSrc:      ?SpO2:   100% 98%  ?Weight:      ?Height:      ? ?Weight change:  ?No intake or output data in the 24 hours ending 06/12/21 0929 ? ? ? ? ?Labs: ?Basic Metabolic Panel: ?Recent Labs  ?Lab  06/10/21 ?2124 06/11/21 ?0825 06/12/21 ?4132  ?NA 132* 135 129*  ?K 6.1* 3.8 6.2*  ?CL 93* 93* 93*  ?CO2 20* 27 22  ?GLUCOSE 325* 94 350*  ?BUN 89* 44* 74*  ?CREATININE 12.41* 7.46* 10.20*  ?CALCIUM 9.0 9.1 8.5*  ?PHOS  --   --  8.7*  ? ?Liver Function Tests: ?Recent Labs  ?Lab 06/11/21 ?0825 06/12/21 ?4401  ?AST 14*  --   ?ALT 14  --   ?ALKPHOS 65  --   ?BILITOT 0.8  --   ?PROT 8.8*  --   ?ALBUMIN 3.9 3.4*  ? ?No results for input(s): LIPASE, AMYLASE in the last 168 hours. ?No results for input(s): AMMONIA in the last 168 hours. ?CBC: ?Recent Labs  ?Lab 06/10/21 ?2124 06/11/21 ?0825 06/12/21 ?0272  ?WBC 18.9* 17.4*  --   ?NEUTROABS 17.2* 14.3*  --   ?HGB 7.9* 8.0* 7.3*  ?HCT 27.5* 27.2* 24.3*  ?MCV 101.1* 98.9  --   ?PLT 222 226  --   ? ?Cardiac Enzymes: ?No results for input(s): CKTOTAL, CKMB, CKMBINDEX, TROPONINI in the last 168 hours. ?CBG: ?Recent Labs  ?Lab 06/11/21 ?0801 06/11/21 ?1158 06/11/21 ?1639 06/12/21 ?0119 06/12/21 ?5366  ?GLUCAP 87 114* 105* 253* 279*  ? ? ?Iron Studies: No results for input(s): IRON, TIBC, TRANSFERRIN, FERRITIN in the last 72 hours. ?Studies/Results: ?DG Chest Port 1 View ? ?Result Date: 06/10/2021 ?CLINICAL DATA:  Hemodialysis patient with shortness of breath, did not finish dialysis session two days ago due to foot pain. EXAM: PORTABLE CHEST 1 VIEW COMPARISON:  Portable chest 05/16/2021. FINDINGS: The heart is moderately enlarged. Interval removal left IJ dialysis catheter with interval insertion of a right IJ dialysis catheter with tip in the right atrium interval removal right IJ central line. There is increased perihilar vascular congestion, mild increased interstitial consolidation consistent with edema and development of small pleural effusions. There is a stable mediastinum with calcification in the aortic arch, mild aortic tortuosity. Perihilar hazy opacities are also present most likely ground-glass edema, less likely pneumonitis. IMPRESSION: Cardiomegaly with perihilar  vascular congestion and bilateral interstitial edema with small pleural effusions, findings consistent with CHF or fluid overload. Additional perihilar opacities probably due to ground-glass edema, less likely infectious process. Clinical correlation and radiographic follow-up recommended. Electronically Signed   By: Telford Nab M.D.   On: 06/10/2021 22:15   ? ?Medications: ?Infusions: ? sodium chloride    ? sodium chloride    ? ? ?Scheduled Medications: ? amLODipine  10 mg Oral Daily  ? aspirin EC  81 mg Oral Q breakfast  ? atorvastatin  10 mg Oral QHS  ? calcium carbonate  1,500 mg Oral TID with meals  ? carvedilol  25 mg Oral BID  ? Chlorhexidine Gluconate Cloth  6 each Topical Q0600  ? docusate sodium  100 mg Oral BID  ? gabapentin  100 mg Oral Daily  ? gabapentin  100 mg Oral Once per day on Sun Mon Wed Fri  ? gabapentin  200 mg Oral QHS  ? gabapentin  200 mg Oral Once per day on Tue Thu Sat  ? heparin  5,000 Units Subcutaneous Q8H  ? insulin aspart  0-6 Units Subcutaneous TID WC  ? insulin aspart  3 Units Subcutaneous TID WC  ? insulin glargine-yfgn  8 Units Subcutaneous QHS  ? pantoprazole  40 mg Oral Daily  ? [START ON 06/13/2021] predniSONE  5 mg Oral Q breakfast  ? tiZANidine  4 mg Oral TID  ? ? have reviewed scheduled and prn medications. ? ?Physical Exam: ?General:NAD, comfortable ?Heart:RRR, s1s2 nl ?Lungs: Distant breath sound, no increased work of breathing ?Abdomen:soft, Non-tender,  ?Extremities:LE edema+ ?Dialysis Access: Right IJ TDC ? ?Courtney Gray Tanna Furry ?06/12/2021,9:29 AM ? LOS: 1 day  ? ?

## 2021-06-12 NOTE — ED Notes (Signed)
Pt agrees to discharge to dialysis center, working on transportation to dialysis clinic and then home for discharge ?

## 2021-06-12 NOTE — Progress Notes (Addendum)
PROGRESS NOTE  Courtney Gray TWS:[id number] DOB: [birth year] DOA: 06/10/2021 PCP: [facility]  Brief History:   51 y.o. female with medical history significant of morbid obesity, diabetes mellitus type 2, hyperlipidemia, hypertension, ESRD, and more presents ED with a chief complaint of fluid overload.  Patient reports that she did not finish a full dialysis session on Saturday.  Her normal schedule is Tuesday Thursday Saturday, so she is due today.  She reports her session was cut short because of her gout pain she had to go home.  Today she started becoming short of breath which was worse when she would talk or when she would exert herself.  She also noticed rattling in her lungs when she was breathing.  She became more fatigued.  Patient denies chest pain, but admits to exertional palpitations.  She denies any change in her normal headache and also denies any blurry vision.  She reports she has been taking prednisone which make her sugar and her blood pressure go up.  She knew that with all these symptoms she was fluid overloaded so she came into the ED.  Patient does report she was taking the prednisone for gouty arthritis.  She is also been constipated and feeling a tightness and fullness in her abdomen.  Patient admits to nausea but denies vomiting.  Patient has antihypertensives at home, amlodipine and Coreg.  She reports last time she took Coreg was March 6.  She is not sure the last time she took amlodipine.  She reports she does not take either one of them every day.  She was advised to check her blood pressure and if it is over 140 then take 1 of those medications per her report.  Patient has no further complaints at this time.   Patient does not smoke, does not drink alcohol, does not use illicit drugs.  She is vaccinated for COVID.  Patient is full code.   Hospital Course Pt was volume overloaded from not having completed her HD treatment and needed emergency hemodialysis  treatment. Nephrologist and dialysis nurse called and HD was completed and 4L was removed on early morning of 3/7 with improvement in weight, BP and volume status.  Pt was evaluated again this morning by nephrologist Dr. Joelyn Oms and no further HD needed for 3/7.  He says patient can resume her regular HD schedule 3/9.  BPs have improved and patient off all drips, not requiring bipap and back at baseline status. DC home today.  DC was delayed as patient contested her discharge.  During the entire hospitalization, she repeatedly refused to take her po meds and refused vital signs and blood draws.  On 3/8 morning, patient continues to refuse discharge initially.  However, nephrology went to speak with pt and arranged for patient to have oupt HD on 3/8 (off day for patient) at 1115 AM.  Ultimately, patient agreed with this plan and transportation was arranged to transport patient to HD at her outpatient center.  Lokelma x 1 was given to pt prior to her d/c 06/12/21 am.    Assessment and Plan: * Acute on chronic respiratory failure with hypoxia (HCC) Oxygen requirement up to 4 L Secondary to fluid overload and hypertensive emergency with pulmonary edema BiPAP ordered in the ED, but not started Patient comfortable on 4 L during dialysis at the time of my exam Continue nitro drip>>weaned off  ESRD (end stage renal disease) ([place]) Consulted nephrology Patient in dialysis  at the time of my exam Trend chemistry, Phos, magnesium Received HD early am 3/7  Acute gout Pt is currently completing a steroid taper.   Leukocytosis Leukocytosis 19 No consolidation on chest x-ray No other infectious symptoms Respiratory panel negative Likely related to recent gout flare and prednisone taper Trend in the a.m.  Hyperkalemia Secondary to ESRD and partial dialysis at last session Initially [facility] ordered Improved with HD  Hyperlipidemia Continue statin medication  Morbid obesity with BMI of 60.0-69.9,  adult [facility]) Patient given extensive counseling regarding weight loss, tracking calories, appropriate diets and appropriate exercises Patient verbalizes understanding  Hypertensive emergency Blood pressures as high as 224/100 Continue to titrate nitro drip>>weaned off once pt took home meds Patient noncompliant with home meds  Type 2 diabetes mellitus with other specified complication [facility]) Patient has a glucose of 325 in the ED, her bicarb 20, gap 19 pH normal on VBG Mentation normal Does not meet criteria for DKA Every 4 hour sliding scale coverage 04/23/21 hemoglobin A1c was 8.9 Continue reduced dose of basal insulin          Status is: Inpatient Remains inpatient appropriate because: severity of illness requiring emergent HD and acute on chronic respiratory failure    Family Communication:   no Family at bedside  Consultants:  renal  Code Status:  FULL  DVT Prophylaxis:  [place] Heparin--refuses   Procedures: As Listed in Progress Note Above  Antibiotics: None   Total time spent 55 minutes.  Greater than 50% spent face to face counseling and coordinating care.     Subjective: Patient denies fevers, chills, headache, chest pain, dyspnea, nausea, vomiting, diarrhea, abdominal pain,   Objective: Vitals:   06/11/21 1230 06/11/21 1300 06/11/21 1330 06/11/21 1600  BP: (!) 145/45 131/68 (!) 147/72 (!) 204/70  Pulse:    70  Resp: 18 (!) 25 (!) 26 20  Temp:      TempSrc:      SpO2:    100%  Weight:      Height:       No intake or output data in the 24 hours ending 06/12/21 0841 Weight change:  Exam:  General:  Pt is alert, follows commands appropriately, not in acute distress HEENT: No icterus, No thrush, No neck mass, [place]/AT Cardiovascular: RRR, S1/S2, no rubs, no gallops Respiratory: bibasilar crackles. No wheeze Abdomen: Soft/+BS, non tender, non distended, no guarding Extremities: trace LE edema, No lymphangitis, No petechiae, No rashes, no  synovitis   Data Reviewed: I have personally reviewed following labs and imaging studies Basic Metabolic Panel: Recent Labs  Lab 06/10/21 2124 06/11/21 0825 06/12/21 0556  NA 132* 135 129*  K 6.1* 3.8 6.2*  CL 93* 93* 93*  CO2 20* 27 22  GLUCOSE 325* 94 350*  BUN 89* 44* 74*  CREATININE 12.41* 7.46* 10.20*  CALCIUM 9.0 9.1 8.5*  MG  --  2.0 2.0  PHOS  --   --  8.7*   Liver Function Tests: Recent Labs  Lab 06/11/21 0825 06/12/21 0556  AST 14*  --   ALT 14  --   ALKPHOS 65  --   BILITOT 0.8  --   PROT 8.8*  --   ALBUMIN 3.9 3.4*   No results for input(s): LIPASE, AMYLASE in the last 168 hours. No results for input(s): AMMONIA in the last 168 hours. Coagulation Profile: No results for input(s): INR, PROTIME in the last 168 hours. CBC: Recent Labs  Lab 06/10/21 2124 06/11/21 0825 06/12/21  0556  WBC 18.9* 17.4*  --   NEUTROABS 17.2* 14.3*  --   HGB 7.9* 8.0* 7.3*  HCT 27.5* 27.2* 24.3*  MCV 101.1* 98.9  --   PLT 222 226  --    Cardiac Enzymes: No results for input(s): CKTOTAL, CKMB, CKMBINDEX, TROPONINI in the last 168 hours. BNP: Invalid input(s): POCBNP CBG: Recent Labs  Lab 06/11/21 0425 06/11/21 0801 06/11/21 1158 06/11/21 1639 06/12/21 0119  GLUCAP 107* 87 114* 105* 253*   HbA1C: No results for input(s): HGBA1C in the last 72 hours. Urine analysis: No results found for: COLORURINE, APPEARANCEUR, LABSPEC, PHURINE, GLUCOSEU, HGBUR, BILIRUBINUR, KETONESUR, PROTEINUR, UROBILINOGEN, NITRITE, LEUKOCYTESUR Sepsis Labs: @LABRCNTIP (procalcitonin:4,lacticidven:4) ) Recent Results (from the past 240 hour(s))  Resp Panel by RT-PCR (Flu A&B, Covid) Nasopharyngeal Swab     Status: None   Collection Time: 06/10/21 10:40 PM   Specimen: Nasopharyngeal Swab; Nasopharyngeal(NP) swabs in vial transport medium  Result Value Ref Range Status   SARS Coronavirus 2 by RT PCR NEGATIVE NEGATIVE Final    Comment: (NOTE) SARS-CoV-2 target nucleic acids are NOT  DETECTED.  The SARS-CoV-2 RNA is generally detectable in upper respiratory specimens during the acute phase of infection. The lowest concentration of SARS-CoV-2 viral copies this assay can detect is 138 copies/mL. A negative result does not preclude SARS-Cov-2 infection and should not be used as the sole basis for treatment or other patient management decisions. A negative result may occur with  improper specimen collection/handling, submission of specimen other than nasopharyngeal swab, presence of viral mutation(s) within the areas targeted by this assay, and inadequate number of viral copies(<138 copies/mL). A negative result must be combined with clinical observations, patient history, and epidemiological information. The expected result is Negative.  Fact Sheet for Patients:  EntrepreneurPulse.com.au  Fact Sheet for Healthcare Providers:  IncredibleEmployment.be  This test is no t yet approved or cleared by the [place] FDA and  has been authorized for detection and/or diagnosis of SARS-CoV-2 by FDA under an Emergency Use Authorization (EUA). This EUA will remain  in effect (meaning this test can be used) for the duration of the COVID-19 declaration under Section 564(b)(1) of the Act, 21 U.S.C.section 360bbb-3(b)(1), unless the authorization is terminated  or revoked sooner.       Influenza A by PCR NEGATIVE NEGATIVE Final   Influenza B by PCR NEGATIVE NEGATIVE Final    Comment: (NOTE) The Xpert Xpress SARS-CoV-2/FLU/RSV plus assay is intended as an aid in the diagnosis of influenza from Nasopharyngeal swab specimens and should not be used as a sole basis for treatment. Nasal washings and aspirates are unacceptable for Xpert Xpress SARS-CoV-2/FLU/RSV testing.  Fact Sheet for Patients: EntrepreneurPulse.com.au  Fact Sheet for Healthcare Providers: IncredibleEmployment.be  This test is not yet  approved or cleared by the [place] FDA and has been authorized for detection and/or diagnosis of SARS-CoV-2 by FDA under an Emergency Use Authorization (EUA). This EUA will remain in effect (meaning this test can be used) for the duration of the COVID-19 declaration under Section 564(b)(1) of the Act, 21 U.S.C. section 360bbb-3(b)(1), unless the authorization is terminated or revoked.  Performed at [facility], [address], [place], [place] [address]      Scheduled Meds:  amLODipine  10 mg Oral Daily   aspirin EC  81 mg Oral Q breakfast   atorvastatin  10 mg Oral QHS   calcium carbonate  1,500 mg Oral TID with meals   carvedilol  25 mg Oral BID  Chlorhexidine Gluconate Cloth  6 each Topical Q0600   docusate sodium  100 mg Oral BID   gabapentin  100 mg Oral Daily   gabapentin  100 mg Oral Once per day on Sun Mon Wed Fri   gabapentin  200 mg Oral QHS   gabapentin  200 mg Oral Once per day on Tue Thu Sat   heparin  5,000 Units Subcutaneous Q8H   insulin aspart  0-6 Units Subcutaneous TID WC   insulin aspart  3 Units Subcutaneous TID WC   insulin glargine-yfgn  8 Units Subcutaneous QHS   pantoprazole  40 mg Oral Daily   predniSONE  10 mg Oral Q breakfast   Followed by   Derrill Memo ON 06/13/2021] predniSONE  5 mg Oral Q breakfast   tiZANidine  4 mg Oral TID   Continuous Infusions:  sodium chloride     sodium chloride      Procedures/Studies: CT ABDOMEN PELVIS WO CONTRAST  Result Date: 05/19/2021 CLINICAL DATA:  Rectal bleeding, rule out GMS EXAM: CT ABDOMEN AND PELVIS WITHOUT CONTRAST TECHNIQUE: Multidetector CT imaging of the abdomen and pelvis was performed following the standard protocol without IV contrast. RADIATION DOSE REDUCTION: This exam was performed according to the departmental dose-optimization program which includes automated exposure control, adjustment of the mA and/or kV according to patient size and/or use of iterative reconstruction technique. COMPARISON:   09/02/2020 FINDINGS: Lower chest: No acute abnormality. Extensive three-vessel artery calcifications. Hepatobiliary: No solid liver abnormality is seen. No gallstones, gallbladder wall thickening, or biliary dilatation. Pancreas: Unremarkable. No pancreatic ductal dilatation or surrounding inflammatory changes. Spleen: Normal in size without significant abnormality. Adrenals/Urinary Tract: Adrenal glands are unremarkable. Very atrophic bilateral kidneys. No calculi or hydronephrosis. Bladder is unremarkable. Stomach/Bowel: Stomach is within normal limits. Appendix appears normal. No evidence of bowel wall thickening, distention, or inflammatory changes. Previously ingested barium contrast admixed with stool in the distal colon and rectum. Vascular/Lymphatic: Aortic atherosclerosis and vascular calcinosis. No enlarged abdominal or pelvic lymph nodes. Reproductive: No mass or other significant abnormality. Other: No abdominal wall hernia or abnormality. No ascites. Musculoskeletal: No acute or significant osseous findings. IMPRESSION: 1. Previously ingested barium contrast admixed with stool in the distal colon and rectum. Within this limitation, no acute noncontrast CT findings of the abdomen or pelvis to explain or localize rectal bleeding. 2. Very atrophic bilateral kidneys. 3. Coronary artery disease. Aortic Atherosclerosis (ICD10-I70.0). Electronically Signed   By: Delanna Ahmadi M.D.   On: 05/19/2021 18:02   IR Fluoro Guide CV Line Right  Result Date: 05/24/2021 CLINICAL DATA:  End-stage renal disease needing hemodialysis long-term. Poor function of left tunneled IJ hemodialysis catheter despite recent revision. Contralateral catheter placement requested. EXAM: TUNNELED HEMODIALYSIS CATHETER PLACEMENT WITH ULTRASOUND AND FLUOROSCOPIC GUIDANCE REMOVAL OF TUNNELED HEMODIALYSIS CATHETER TECHNIQUE: The procedure, risks, benefits, and alternatives were explained to the patient. Questions regarding the procedure  were encouraged and answered. The patient understands and consents to the procedure. As antibiotic prophylaxis, vancomycin 1 G was ordered pre-procedure and administered intravenously within one hour of incision.Patency of the right IJ vein was confirmed with ultrasound with image documentation. An appropriate skin site was determined. Region was prepped using maximum barrier technique including cap and mask, sterile gown, sterile gloves, large sterile sheet, and Chlorhexidine as cutaneous antisepsis. The region was infiltrated locally with 1% lidocaine. Intravenous Fentanyl 150mcg and Versed 1.5mg  were administered as conscious sedation during continuous monitoring of the patient's level of consciousness and physiological / cardiorespiratory status by the radiology  RN, with a total moderate sedation time of 15 minutes. Under real-time ultrasound guidance, the right IJ vein was accessed with a 21 gauge micropuncture needle; the needle tip within the vein was confirmed with ultrasound image documentation. Needle exchanged over the 018 guidewire for transitional dilator, which allowed advancement of a Benson wire into the IVC. Over this, an MPA catheter was advanced. A Palindrome 23 hemodialysis catheter was tunneled from the right anterior chest wall approach to the right IJ dermatotomy site. The MPA catheter was exchanged over an Amplatz wire for serial vascular dilators which allow placement of a peel-away sheath, through which the catheter was advanced under intermittent fluoroscopy, positioned with its tips in the proximal and midright atrium. Spot chest radiograph confirms good catheter position. No pneumothorax. Catheter was flushed and primed per protocol. Catheter secured externally with O Prolene sutures. The right IJ dermatotomy site was closed with Dermabond. Attention was turned to the previously placed left tunneled hemodialysis catheter. The retention sutures were cut. The catheter was removed intact  using gentle traction. The patient tolerated the procedure well. COMPLICATIONS: COMPLICATIONS None immediate FLUOROSCOPY: Radiation Exposure Index (as provided by the fluoroscopic device): 55 mGy Kerma COMPARISON:  None IMPRESSION: 1. Technically successful placement of tunneled right IJ hemodialysis catheter with ultrasound and fluoroscopic guidance. Ready for routine use. 2. Removal of tunneled left chest hemodialysis catheter ACCESS: Remains approachable for percutaneous intervention as needed. Electronically Signed   By: Lucrezia Europe M.D.   On: 05/24/2021 14:13   IR Fluoro Guide CV Line Right  Result Date: 05/13/2021 INDICATION: 51 year old female with a history of end-stage renal disease on hemodialysis via a left IJ 28 cm palindrome hemodialysis catheter. This catheter was last exchanged on 02/20/2021 at which time balloon disruption of an underlying fibrin sheath was performed. The catheter then worked well until recently when she began experience positional dependence and poor flows during hemodialysis. She presents for catheter evaluation with possible repeat balloon disruption and replacement. Of note, in the future the patient would like to switch to a right sided hemodialysis catheter. However, she was not prepared for moderate sedation today. EXAM: IR RIGHT FLUORO GUIDE CV LINE; IR PTA VENOUS EXCEPT DIALYSIS CIRCUIT MEDICATIONS: None. ANESTHESIA/SEDATION: None. FLUOROSCOPY TIME:  145 mGy air kerma COMPLICATIONS: None immediate. PROCEDURE: Informed written consent was obtained from the patient after a thorough discussion of the procedural risks, benefits and alternatives. All questions were addressed. Maximal Sterile Barrier Technique was utilized including caps, mask, sterile gowns, sterile gloves, sterile drape, hand hygiene and skin antiseptic. A timeout was performed prior to the initiation of the procedure. Initial spot fluoroscopic imaging was performed. The 28 cm palindrome tunneled hemodialysis  catheter is relatively well position with the catheter tip overlying the upper right atrium. The venous port flushes and aspirates easily. The arterial port could not be aspirated. Contrast injection was first performed through the venous port. This demonstrates normal filling of the right atrium. Local anesthesia was attained by infiltration with 1% lidocaine. The retention cuff was released from the surrounding soft tissues using sharp dissection. A stiff glidewire was advanced through the venous lumen, through the right heart and into the inferior vena cava. As the catheter was manipulated, aspiration was maintained on the arterial port in till there was successful aspiration of the indwelling heparin and blood. At this time, contrast was injected through the arterial lumen under digital subtraction angiography. There is clear evidence of a fibrin sheath along the distal aspect of the catheter. This is  likely the underlying source of catheter malfunction. The catheter was removed. A 16 mm atlas balloon was advanced over the wire and positioned in the region of the fibrin sheath. The balloon was then inflated to full effacement disrupting the fibrin sheath. The balloon was removed. A new 28 cm palindrome tunneled hemodialysis catheter was advanced over the wire and position with the tip in the right mid atrium. Both the arterial and venous lumens flush and aspirate easily. The catheter was flushed with saline in the lumens locked with 2.1 mL heparin. The catheter was capped and secured to the skin with 0 Prolene suture. Sterile bandages were applied. The patient tolerated the procedure well. IMPRESSION: 1. Fibrin sheath partially obstructing flow through the arterial lumen. 2. Successful balloon disruption of fibrin sheath. 3. Successful exchange for a new 28 cm palindrome tunneled hemodialysis catheter. Catheter tips are in the right mid atrium and the device is ready for immediate use. Electronically Signed    By: Jacqulynn Cadet M.D.   On: 05/13/2021 13:09   IR Removal Tun Cv Cath W/O FL  Result Date: 05/24/2021 CLINICAL DATA:  End-stage renal disease needing hemodialysis long-term. Poor function of left tunneled IJ hemodialysis catheter despite recent revision. Contralateral catheter placement requested. EXAM: TUNNELED HEMODIALYSIS CATHETER PLACEMENT WITH ULTRASOUND AND FLUOROSCOPIC GUIDANCE REMOVAL OF TUNNELED HEMODIALYSIS CATHETER TECHNIQUE: The procedure, risks, benefits, and alternatives were explained to the patient. Questions regarding the procedure were encouraged and answered. The patient understands and consents to the procedure. As antibiotic prophylaxis, vancomycin 1 G was ordered pre-procedure and administered intravenously within one hour of incision.Patency of the right IJ vein was confirmed with ultrasound with image documentation. An appropriate skin site was determined. Region was prepped using maximum barrier technique including cap and mask, sterile gown, sterile gloves, large sterile sheet, and Chlorhexidine as cutaneous antisepsis. The region was infiltrated locally with 1% lidocaine. Intravenous Fentanyl 176mcg and Versed 1.5mg  were administered as conscious sedation during continuous monitoring of the patient's level of consciousness and physiological / cardiorespiratory status by the radiology RN, with a total moderate sedation time of 15 minutes. Under real-time ultrasound guidance, the right IJ vein was accessed with a 21 gauge micropuncture needle; the needle tip within the vein was confirmed with ultrasound image documentation. Needle exchanged over the 018 guidewire for transitional dilator, which allowed advancement of a Benson wire into the IVC. Over this, an MPA catheter was advanced. A Palindrome 23 hemodialysis catheter was tunneled from the right anterior chest wall approach to the right IJ dermatotomy site. The MPA catheter was exchanged over an Amplatz wire for serial vascular  dilators which allow placement of a peel-away sheath, through which the catheter was advanced under intermittent fluoroscopy, positioned with its tips in the proximal and midright atrium. Spot chest radiograph confirms good catheter position. No pneumothorax. Catheter was flushed and primed per protocol. Catheter secured externally with O Prolene sutures. The right IJ dermatotomy site was closed with Dermabond. Attention was turned to the previously placed left tunneled hemodialysis catheter. The retention sutures were cut. The catheter was removed intact using gentle traction. The patient tolerated the procedure well. COMPLICATIONS: COMPLICATIONS None immediate FLUOROSCOPY: Radiation Exposure Index (as provided by the fluoroscopic device): 55 mGy Kerma COMPARISON:  None IMPRESSION: 1. Technically successful placement of tunneled right IJ hemodialysis catheter with ultrasound and fluoroscopic guidance. Ready for routine use. 2. Removal of tunneled left chest hemodialysis catheter ACCESS: Remains approachable for percutaneous intervention as needed. Electronically Signed   By: Keturah Barre  Vernard Gambles M.D.   On: 05/24/2021 14:13   IR US Guide Vasc Access Right  Result Date: 05/24/2021 CLINICAL DATA:  End-stage renal disease needing hemodialysis long-term. Poor function of left tunneled IJ hemodialysis catheter despite recent revision. Contralateral catheter placement requested. EXAM: TUNNELED HEMODIALYSIS CATHETER PLACEMENT WITH ULTRASOUND AND FLUOROSCOPIC GUIDANCE REMOVAL OF TUNNELED HEMODIALYSIS CATHETER TECHNIQUE: The procedure, risks, benefits, and alternatives were explained to the patient. Questions regarding the procedure were encouraged and answered. The patient understands and consents to the procedure. As antibiotic prophylaxis, vancomycin 1 G was ordered pre-procedure and administered intravenously within one hour of incision.Patency of the right IJ vein was confirmed with ultrasound with image documentation. An  appropriate skin site was determined. Region was prepped using maximum barrier technique including cap and mask, sterile gown, sterile gloves, large sterile sheet, and Chlorhexidine as cutaneous antisepsis. The region was infiltrated locally with 1% lidocaine. Intravenous Fentanyl 134mcg and Versed 1.5mg  were administered as conscious sedation during continuous monitoring of the patient's level of consciousness and physiological / cardiorespiratory status by the radiology RN, with a total moderate sedation time of 15 minutes. Under real-time ultrasound guidance, the right IJ vein was accessed with a 21 gauge micropuncture needle; the needle tip within the vein was confirmed with ultrasound image documentation. Needle exchanged over the 018 guidewire for transitional dilator, which allowed advancement of a Benson wire into the IVC. Over this, an MPA catheter was advanced. A Palindrome 23 hemodialysis catheter was tunneled from the right anterior chest wall approach to the right IJ dermatotomy site. The MPA catheter was exchanged over an Amplatz wire for serial vascular dilators which allow placement of a peel-away sheath, through which the catheter was advanced under intermittent fluoroscopy, positioned with its tips in the proximal and midright atrium. Spot chest radiograph confirms good catheter position. No pneumothorax. Catheter was flushed and primed per protocol. Catheter secured externally with O Prolene sutures. The right IJ dermatotomy site was closed with Dermabond. Attention was turned to the previously placed left tunneled hemodialysis catheter. The retention sutures were cut. The catheter was removed intact using gentle traction. The patient tolerated the procedure well. COMPLICATIONS: COMPLICATIONS None immediate FLUOROSCOPY: Radiation Exposure Index (as provided by the fluoroscopic device): 55 mGy Kerma COMPARISON:  None IMPRESSION: 1. Technically successful placement of tunneled right IJ hemodialysis  catheter with ultrasound and fluoroscopic guidance. Ready for routine use. 2. Removal of tunneled left chest hemodialysis catheter ACCESS: Remains approachable for percutaneous intervention as needed. Electronically Signed   By: Lucrezia Europe M.D.   On: 05/24/2021 14:13   DG Chest Port 1 View  Result Date: 06/10/2021 CLINICAL DATA:  Hemodialysis patient with shortness of breath, did not finish dialysis session two days ago due to foot pain. EXAM: PORTABLE CHEST 1 VIEW COMPARISON:  Portable chest 05/16/2021. FINDINGS: The heart is moderately enlarged. Interval removal left IJ dialysis catheter with interval insertion of a right IJ dialysis catheter with tip in the right atrium interval removal right IJ central line. There is increased perihilar vascular congestion, mild increased interstitial consolidation consistent with edema and development of small pleural effusions. There is a stable mediastinum with calcification in the aortic arch, mild aortic tortuosity. Perihilar hazy opacities are also present most likely ground-glass edema, less likely pneumonitis. IMPRESSION: Cardiomegaly with perihilar vascular congestion and bilateral interstitial edema with small pleural effusions, findings consistent with CHF or fluid overload. Additional perihilar opacities probably due to ground-glass edema, less likely infectious process. Clinical correlation and radiographic follow-up recommended. Electronically Signed  By: Telford Nab M.D.   On: 06/10/2021 22:15   DG Chest Portable 1 View  Result Date: 05/16/2021 CLINICAL DATA:  IV access EXAM: PORTABLE CHEST 1 VIEW COMPARISON:  05/16/2021 FINDINGS: Left dialysis catheter with tip over the cavoatrial junction region, unchanged. New right central venous catheter is present with tip over the low SVC region. No pneumothorax. Cardiac enlargement. No vascular congestion, edema, or consolidation. Mediastinal contours appear intact. IMPRESSION: Appliances appear in satisfactory  position.  Cardiac enlargement. Electronically Signed   By: Lucienne Capers M.D.   On: 05/16/2021 19:30   DG Chest Port 1 View  Result Date: 05/16/2021 CLINICAL DATA:  Shortness of breath, chest pain EXAM: PORTABLE CHEST 1 VIEW COMPARISON:  Previous studies including the examination of 04/22/2021 FINDINGS: Transverse diameter of heart is increased. Central pulmonary vessels are prominent without signs of alveolar pulmonary edema. There is no focal pulmonary consolidation. Left lateral CP angle is indistinct. There is questionable minimal blunting of right lateral CP angle. There is no pneumothorax. IMPRESSION: Cardiomegaly. Central pulmonary vessels are prominent suggesting mild CHF. There are no signs of alveolar pulmonary edema or focal pulmonary consolidation. Small bilateral pleural effusions, more so on the left side. Electronically Signed   By: Elmer Picker M.D.   On: 05/16/2021 12:11   DG Foot 2 Views Left  Result Date: 05/17/2021 CLINICAL DATA:  Pain and swelling EXAM: LEFT FOOT - 2 VIEW COMPARISON:  None. FINDINGS: No fracture or dislocation is seen. Extensive arterial calcifications are seen. Plantar spur is seen in calcaneus. There is mild hallux valgus deformity. Cortical irregularity in the medial aspect of head of the first metatarsal may be residual from previous injury. IMPRESSION: No fracture or dislocation is seen. There are no focal lytic lesions. Plantar spur is seen in calcaneus. Electronically Signed   By: Elmer Picker M.D.   On: 05/17/2021 13:41   DG Foot 2 Views Right  Result Date: 05/17/2021 CLINICAL DATA:  Pain and swelling EXAM: RIGHT FOOT - 2 VIEW COMPARISON:  04/23/2021 FINDINGS: No fracture or dislocation is seen. Plantar spur is seen in calcaneus. Degenerative changes are noted in the intertarsal joints with bony spurs in the dorsal aspect. Extensive arterial calcifications are seen in the soft tissues. There is mild hallux valgus deformity. IMPRESSION: No  fracture or dislocation is seen. There are no focal lytic lesions. Plantar spur is seen in calcaneus. Electronically Signed   By: Elmer Picker M.D.   On: 05/17/2021 13:43   IR PTA VENOUS EXCEPT DIALYSIS CIRCUIT  Result Date: 05/13/2021 INDICATION: 50 year old female with a history of end-stage renal disease on hemodialysis via a left IJ 28 cm palindrome hemodialysis catheter. This catheter was last exchanged on 02/20/2021 at which time balloon disruption of an underlying fibrin sheath was performed. The catheter then worked well until recently when she began experience positional dependence and poor flows during hemodialysis. She presents for catheter evaluation with possible repeat balloon disruption and replacement. Of note, in the future the patient would like to switch to a right sided hemodialysis catheter. However, she was not prepared for moderate sedation today. EXAM: IR RIGHT FLUORO GUIDE CV LINE; IR PTA VENOUS EXCEPT DIALYSIS CIRCUIT MEDICATIONS: None. ANESTHESIA/SEDATION: None. FLUOROSCOPY TIME:  145 mGy air kerma COMPLICATIONS: None immediate. PROCEDURE: Informed written consent was obtained from the patient after a thorough discussion of the procedural risks, benefits and alternatives. All questions were addressed. Maximal Sterile Barrier Technique was utilized including caps, mask, sterile gowns, sterile gloves, sterile drape, hand  hygiene and skin antiseptic. A timeout was performed prior to the initiation of the procedure. Initial spot fluoroscopic imaging was performed. The 28 cm palindrome tunneled hemodialysis catheter is relatively well position with the catheter tip overlying the upper right atrium. The venous port flushes and aspirates easily. The arterial port could not be aspirated. Contrast injection was first performed through the venous port. This demonstrates normal filling of the right atrium. Local anesthesia was attained by infiltration with 1% lidocaine. The retention cuff  was released from the surrounding soft tissues using sharp dissection. A stiff glidewire was advanced through the venous lumen, through the right heart and into the inferior vena cava. As the catheter was manipulated, aspiration was maintained on the arterial port in till there was successful aspiration of the indwelling heparin and blood. At this time, contrast was injected through the arterial lumen under digital subtraction angiography. There is clear evidence of a fibrin sheath along the distal aspect of the catheter. This is likely the underlying source of catheter malfunction. The catheter was removed. A 16 mm atlas balloon was advanced over the wire and positioned in the region of the fibrin sheath. The balloon was then inflated to full effacement disrupting the fibrin sheath. The balloon was removed. A new 28 cm palindrome tunneled hemodialysis catheter was advanced over the wire and position with the tip in the right mid atrium. Both the arterial and venous lumens flush and aspirate easily. The catheter was flushed with saline in the lumens locked with 2.1 mL heparin. The catheter was capped and secured to the skin with 0 Prolene suture. Sterile bandages were applied. The patient tolerated the procedure well. IMPRESSION: 1. Fibrin sheath partially obstructing flow through the arterial lumen. 2. Successful balloon disruption of fibrin sheath. 3. Successful exchange for a new 28 cm palindrome tunneled hemodialysis catheter. Catheter tips are in the right mid atrium and the device is ready for immediate use. Electronically Signed   By: Jacqulynn Cadet M.D.   On: 05/13/2021 13:09   ECHOCARDIOGRAM COMPLETE  Result Date: 05/17/2021    ECHOCARDIOGRAM REPORT   Patient Name:   KIALEE KHAM Date of Exam: 05/17/2021 Medical Rec #:  [id number]       Height:       65.0 in Accession #:    [id number]      Weight:       375.2 lb Date of Birth:  1970-07-12        BSA:          2.585 m Patient Age:    45 years         BP:           127/83 mmHg Patient Gender: F               HR:           70 bpm. Exam Location:  Inpatient Procedure: 2D Echo, Cardiac Doppler, Color Doppler and Intracardiac            Opacification Agent Indications:    NSTEMI  History:        Patient has prior history of Echocardiogram examinations, most                 recent 04/23/2021. Risk Factors:Hypertension, Diabetes and                 Dyslipidemia. ESRD.  Sonographer:    Clayton Lefort RDCS (AE) Referring Phys: [id number] [facility]  Sonographer Comments: Patient is  morbidly obese. Image acquisition challenging due to patient body habitus. IMPRESSIONS  1. Left ventricular ejection fraction, by estimation, is 55 to 60%. The left ventricle has normal function. The left ventricle has no regional wall motion abnormalities. There is moderate concentric left ventricular hypertrophy. Left ventricular diastolic parameters are consistent with Grade II diastolic dysfunction (pseudonormalization). Elevated left atrial pressure.  2. Right ventricular systolic function is normal. The right ventricular size is normal. Tricuspid regurgitation signal is inadequate for assessing PA pressure.  3. Left atrial size was mildly dilated.  4. The mitral valve is degenerative. Mild mitral valve regurgitation. No evidence of mitral stenosis. The mean mitral valve gradient is 3.0 mmHg with average heart rate of 68 bpm. Moderate to severe mitral annular calcification.  5. The aortic valve is tricuspid. There is moderate calcification of the aortic valve. There is moderate thickening of the aortic valve. Aortic valve regurgitation is not visualized. Mild aortic valve stenosis. Aortic valve mean gradient measures 13.0 mmHg. Aortic valve Vmax measures 2.45 m/s. Comparison(s): Prior images reviewed side by side. There is now mild aortic stenosis. FINDINGS  Left Ventricle: Left ventricular ejection fraction, by estimation, is 55 to 60%. The left ventricle has normal function. The left  ventricle has no regional wall motion abnormalities. Definity contrast agent was given IV to delineate the left ventricular  endocardial borders. The left ventricular internal cavity size was normal in size. There is moderate concentric left ventricular hypertrophy. Left ventricular diastolic parameters are consistent with Grade II diastolic dysfunction (pseudonormalization).  Elevated left atrial pressure. Right Ventricle: The right ventricular size is normal. No increase in right ventricular wall thickness. Right ventricular systolic function is normal. Tricuspid regurgitation signal is inadequate for assessing PA pressure. Left Atrium: Left atrial size was mildly dilated. Right Atrium: Right atrial size was normal in size. Pericardium: There is no evidence of pericardial effusion. Mitral Valve: The mitral valve is degenerative in appearance. Moderate to severe mitral annular calcification. Mild mitral valve regurgitation. No evidence of mitral valve stenosis. MV peak gradient, 6.7 mmHg. The mean mitral valve gradient is 3.0 mmHg with average heart rate of 68 bpm. Tricuspid Valve: The tricuspid valve is normal in structure. Tricuspid valve regurgitation is trivial. Aortic Valve: The aortic valve is tricuspid. There is moderate calcification of the aortic valve. There is moderate thickening of the aortic valve. Aortic valve regurgitation is not visualized. Mild aortic stenosis is present. Aortic valve mean gradient measures 13.0 mmHg. Aortic valve peak gradient measures 24.0 mmHg. Aortic valve area, by VTI measures 1.58 cm. Pulmonic Valve: The pulmonic valve was grossly normal. Pulmonic valve regurgitation is not visualized. Aorta: The aortic root is normal in size and structure. IAS/Shunts: No atrial level shunt detected by color flow Doppler.  LEFT VENTRICLE PLAX 2D LVIDd:         5.00 cm   Diastology LVIDs:         3.40 cm   LV e' medial:    5.70 cm/s LV PW:         1.60 cm   LV E/e' medial:  20.7 LV IVS:         1.80 cm   LV e' lateral:   7.58 cm/s LVOT diam:     2.16 cm   LV E/e' lateral: 15.6 LV SV:         81 LV SV Index:   31 LVOT Area:     3.66 cm  RIGHT VENTRICLE RV Basal diam:  3.30 cm RV S  prime:     17.70 cm/s TAPSE (M-mode): 3.3 cm LEFT ATRIUM              Index        RIGHT ATRIUM           Index LA diam:        3.00 cm  1.16 cm/m   RA Area:     14.60 cm LA Vol (A2C):   104.0 ml 40.24 ml/m  RA Volume:   37.00 ml  14.32 ml/m LA Vol (A4C):   67.4 ml  26.08 ml/m LA Biplane Vol: 83.6 ml  32.35 ml/m  AORTIC VALVE AV Area (Vmax):    1.47 cm AV Area (Vmean):   1.41 cm AV Area (VTI):     1.58 cm AV Vmax:           245.00 cm/s AV Vmean:          168.000 cm/s AV VTI:            0.516 m AV Peak Grad:      24.0 mmHg AV Mean Grad:      13.0 mmHg LVOT Vmax:         98.50 cm/s LVOT Vmean:        64.600 cm/s LVOT VTI:          0.222 m LVOT/AV VTI ratio: 0.43  AORTA Ao Root diam: 3.30 cm Ao Asc diam:  3.40 cm MITRAL VALVE MV Area (PHT): 2.41 cm     SHUNTS MV Peak grad:  6.7 mmHg     Systemic VTI:  0.22 m MV Mean grad:  3.0 mmHg     Systemic Diam: 2.16 cm MV Vmax:       1.29 m/s MV Vmean:      83.9 cm/s MV Decel Time: 315 msec MV E velocity: 118.00 cm/s MV A velocity: 108.00 cm/s MV E/A ratio:  1.09 Mihai Croitoru MD Electronically signed by Sanda Klein MD Signature Date/Time: 05/17/2021/1:29:46 PM    Final    [place] EKG SITE RITE  Result Date: 05/16/2021 If Site Rite image not attached, placement could not be confirmed due to current cardiac rhythm.   Orson Eva, DO  [facility]  If 7PM-7AM, please contact night-coverage [email or web address] Password [email or web address] 06/12/2021, 8:41 AM   LOS: 1 day

## 2021-06-12 NOTE — ED Notes (Signed)
Pt upset because she is unable to sleep due to being in hallway and due to bright lights in hallway. Explained to patient that we didn't have any rooms available at the moment but once we do we would move her into a room. ?

## 2021-06-12 NOTE — ED Notes (Signed)
Pt requested something to eat, was unable to find her the sandwich that she wanted but gave her meal tray with [place], green beans and mashed potatoes was given.   ?

## 2021-06-13 DIAGNOSIS — N186 End stage renal disease: Secondary | ICD-10-CM | POA: Diagnosis not present

## 2021-06-13 DIAGNOSIS — E8779 Other fluid overload: Secondary | ICD-10-CM | POA: Diagnosis not present

## 2021-06-13 DIAGNOSIS — Z992 Dependence on renal dialysis: Secondary | ICD-10-CM | POA: Diagnosis not present

## 2021-06-13 DIAGNOSIS — J9691 Respiratory failure, unspecified with hypoxia: Secondary | ICD-10-CM | POA: Diagnosis not present

## 2021-06-14 ENCOUNTER — Ambulatory Visit: Payer: Medicare Other | Admitting: Nurse Practitioner

## 2021-06-14 DIAGNOSIS — I10 Essential (primary) hypertension: Secondary | ICD-10-CM | POA: Diagnosis not present

## 2021-06-14 DIAGNOSIS — M109 Gout, unspecified: Secondary | ICD-10-CM | POA: Diagnosis not present

## 2021-06-14 DIAGNOSIS — Z299 Encounter for prophylactic measures, unspecified: Secondary | ICD-10-CM | POA: Diagnosis not present

## 2021-06-15 DIAGNOSIS — N186 End stage renal disease: Secondary | ICD-10-CM | POA: Diagnosis not present

## 2021-06-15 DIAGNOSIS — E8779 Other fluid overload: Secondary | ICD-10-CM | POA: Diagnosis not present

## 2021-06-15 DIAGNOSIS — Z992 Dependence on renal dialysis: Secondary | ICD-10-CM | POA: Diagnosis not present

## 2021-06-18 ENCOUNTER — Ambulatory Visit (HOSPITAL_BASED_OUTPATIENT_CLINIC_OR_DEPARTMENT_OTHER): Payer: Medicare Other | Admitting: General Practice

## 2021-06-18 DIAGNOSIS — E8779 Other fluid overload: Secondary | ICD-10-CM | POA: Diagnosis not present

## 2021-06-18 DIAGNOSIS — Z992 Dependence on renal dialysis: Secondary | ICD-10-CM | POA: Diagnosis not present

## 2021-06-18 DIAGNOSIS — N186 End stage renal disease: Secondary | ICD-10-CM | POA: Diagnosis not present

## 2021-06-20 DIAGNOSIS — Z992 Dependence on renal dialysis: Secondary | ICD-10-CM | POA: Diagnosis not present

## 2021-06-20 DIAGNOSIS — N186 End stage renal disease: Secondary | ICD-10-CM | POA: Diagnosis not present

## 2021-06-20 DIAGNOSIS — E8779 Other fluid overload: Secondary | ICD-10-CM | POA: Diagnosis not present

## 2021-06-21 DIAGNOSIS — I10 Essential (primary) hypertension: Secondary | ICD-10-CM | POA: Diagnosis not present

## 2021-06-21 DIAGNOSIS — E1165 Type 2 diabetes mellitus with hyperglycemia: Secondary | ICD-10-CM | POA: Diagnosis not present

## 2021-06-21 DIAGNOSIS — G629 Polyneuropathy, unspecified: Secondary | ICD-10-CM | POA: Diagnosis not present

## 2021-06-21 DIAGNOSIS — M79671 Pain in right foot: Secondary | ICD-10-CM | POA: Diagnosis not present

## 2021-06-21 DIAGNOSIS — Z299 Encounter for prophylactic measures, unspecified: Secondary | ICD-10-CM | POA: Diagnosis not present

## 2021-06-21 NOTE — Progress Notes (Incomplete)
?Cardiology Office Note:   ? ?Date:  06/21/2021  ? ?ID:  Courtney Gray, DOB 09-Oct-1970, MRN [id number] ? ?PCP:  [facility]  ?Cardiologist:  None ? ?Referring MD: Medicine, [facility]  ? ?No chief complaint on file. ? ? ?History of Present Illness:   ? ?Courtney Gray is a 51 y.o. female with a hx of *** who is seen as a new consult at the request of Medicine, [facility] for the evaluation and management of ***. ? ?Cardiovascular risk factors: ?Prior clinical ASCVD:  ?Comorbid conditions, including hypertension, hyperlipidemia, diabetes, chronic kidney disease:  ?Metabolic syndrome/Obesity: ?Chronic inflammatory conditions: ?Tobacco use history: ?Family history: ?Prior cardiac testing and/or incidental findings on other testing (ie coronary calcium): ?Exercise level: ?Current diet: ? ?Overall, ? ?*** denies any palpitations, chest pain, shortness of breath, or peripheral edema. No lightheadedness, headaches, syncope, orthopnea, or PND. ? ?Past Medical History:  ?Diagnosis Date  ? Diabetes mellitus without complication ([place])   ? High cholesterol   ? Hypertension   ? Renal disorder   ? ? ?Past Surgical History:  ?Procedure Laterality Date  ? AV FISTULA PLACEMENT    ? x2, unsuccessful.   ? IR FLUORO GUIDE CV LINE LEFT  12/14/2020  ? IR FLUORO GUIDE CV LINE LEFT  02/20/2021  ? IR FLUORO GUIDE CV LINE RIGHT  08/28/2020  ? IR FLUORO GUIDE CV LINE RIGHT  05/13/2021  ? IR FLUORO GUIDE CV LINE RIGHT  05/24/2021  ? IR PERC TUN PERIT CATH WO PORT S&I /IMAG  09/06/2020  ? IR PTA VENOUS EXCEPT DIALYSIS CIRCUIT  02/20/2021  ? IR PTA VENOUS EXCEPT DIALYSIS CIRCUIT  05/13/2021  ? IR REMOVAL TUN CV CATH W/O FL  05/24/2021  ? IR US GUIDE VASC ACCESS LEFT  09/06/2020  ? IR US GUIDE VASC ACCESS RIGHT  05/24/2021  ? IR VENOCAVAGRAM SVC  02/20/2021  ? ? ?Current Medications: ?Current Outpatient Medications on File Prior to Visit  ?Medication Sig  ? acetaminophen (TYLENOL) 500 MG tablet Take 1,000 mg by mouth every 6 (six) hours as  needed for moderate pain or headache.  ? amLODipine (NORVASC) 10 MG tablet Take 1 tablet (10 mg total) by mouth daily. (Patient taking differently: Take 10 mg by mouth daily as needed (Blood pressusre).)  ? aspirin EC 81 MG tablet Take 1 tablet (81 mg total) by mouth daily with breakfast.  ? atorvastatin (LIPITOR) 10 MG tablet Take 1 tablet (10 mg total) by mouth daily. (Patient taking differently: Take 10 mg by mouth at bedtime.)  ? calcium carbonate (TUMS SMOOTHIES) 750 MG chewable tablet Chew 750-1,500 mg by mouth See admin instructions. Take 1500 mg with each meal and 750 mg with each snack  ? carvedilol (COREG) 25 MG tablet Take 1 tablet (25 mg total) by mouth 2 (two) times daily. (Patient taking differently: Take 25 mg by mouth 2 (two) times daily. Do not take on dialysis days Tuesday Thur. And Sat. Take as needed. Take if blood pressure is over 140)  ? diphenhydrAMINE (BENADRYL) 25 MG tablet Take 25 mg by mouth daily as needed for allergies or sleep.  ? docusate sodium (COLACE) 100 MG capsule Take 100 mg by mouth 2 (two) times daily.  ? gabapentin (NEURONTIN) 100 MG capsule Take 100-200 mg by mouth See admin instructions. 1 tablet in the morning and 1 tablet around 5pm then take 1 tablet at bedtime. Dialysis days take 2 capsules at 5pm  ? HYDROcodone-acetaminophen (NORCO/VICODIN) 5-325 MG tablet Take 1 tablet  by mouth 3 (three) times daily as needed.  ? LANTUS SOLOSTAR 100 UNIT/ML Solostar Pen Inject 12 Units into the skin at bedtime.  ? loperamide (IMODIUM A-D) 2 MG tablet Take 4 mg by mouth as needed for diarrhea or loose stools.  ? nitroGLYCERIN (NITROSTAT) 0.4 MG SL tablet Place 1 tablet (0.4 mg total) under the tongue every 5 (five) minutes as needed for chest pain.  ? pantoprazole (PROTONIX) 40 MG tablet Take 1 tablet (40 mg total) by mouth daily.  ? Phenol (CHLORASEPTIC MT) Use as directed 4 sprays in the mouth or throat 4 (four) times daily as needed (throat pain).  ? predniSONE (STERAPRED UNI-PAK 21  TAB) 5 MG (21) TBPK tablet Take 5 mg by mouth See admin instructions. Take as directed 6,5,4 ,3,2,1  ? PROCTOZONE-HC 2.5 % rectal cream Place 1 application. rectally daily as needed for hemorrhoids.  ? simethicone (MYLICON) 027 MG chewable tablet Chew 125 mg by mouth 2 (two) times daily as needed for flatulence.  ? tiZANidine (ZANAFLEX) 4 MG tablet Take 4 mg by mouth every 8 (eight) hours. Take with gabapentin at same time  ? ?No current facility-administered medications on file prior to visit.  ?  ? ?Allergies:   Benzonatate, Hydralazine, Amoxicillin, Clonidine, Morphine, and Oxycodone  ? ?Social History  ? ?Tobacco Use  ? Smoking status: Never  ? Smokeless tobacco: Never  ?Vaping Use  ? Vaping Use: Never used  ?Substance Use Topics  ? Alcohol use: Never  ? Drug use: Never  ? ? ?Family History: ?family history includes Diabetes in her father and mother. ? ?ROS:   ?Please see the history of present illness.  Additional pertinent ROS: ?Constitutional: Negative for chills, fever, night sweats, unintentional weight loss  ?HENT: Negative for ear pain and hearing loss.   ?Eyes: Negative for loss of vision and eye pain.  ?Respiratory: Negative for cough, sputum, wheezing.   ?Cardiovascular: See HPI. ?Gastrointestinal: Negative for abdominal pain, melena, and hematochezia.  ?Genitourinary: Negative for dysuria and hematuria.  ?Musculoskeletal: Negative for falls and myalgias.  ?Skin: Negative for itching and rash.  ?Neurological: Negative for focal weakness, focal sensory changes and loss of consciousness.  ?Endo/Heme/Allergies: Does not bruise/bleed easily.   ? ? ?EKGs/Labs/Other Studies Reviewed:   ? ?The following studies were reviewed today: ? ?*** ? ?EKG:  EKG is personally reviewed.   ?06/24/2021: *** ? ?Recent Labs: ?06/11/2021: ALT 14; Platelets 226; TSH 3.935 ?06/12/2021: BUN 74; Creatinine, Ser 10.20; Hemoglobin 7.3; Magnesium 2.0; Potassium 6.2; Sodium 129  ? ?Recent Lipid Panel ?   ?Component Value Date/Time  ? CHOL  107 05/18/2021 0150  ? TRIG 222 (H) 05/18/2021 0150  ? HDL 23 (L) 05/18/2021 0150  ? CHOLHDL 4.7 05/18/2021 0150  ? VLDL 44 (H) 05/18/2021 0150  ? [place] 40 05/18/2021 0150  ? ? ?Physical Exam:   ? ?VS:  There were no vitals taken for this visit.   ? ?Wt Readings from Last 3 Encounters:  ?06/11/21 (!) 365 lb 8.4 oz (165.8 kg)  ?05/22/21 (!) 365 lb 8.4 oz (165.8 kg)  ?04/23/21 (!) 371 lb 7.6 oz (168.5 kg)  ?  ?GEN: Well nourished, well developed in no acute distress ?HEENT: Normal, moist mucous membranes ?NECK: No JVD ?CARDIAC: regular rhythm, normal S1 and S2, no rubs or gallops. No murmur. ?VASCULAR: Radial and DP pulses 2+ bilaterally. No carotid bruits ?RESPIRATORY:  Clear to auscultation without rales, wheezing or rhonchi  ?ABDOMEN: Soft, non-tender, non-distended ?MUSCULOSKELETAL:  Ambulates independently ?SKIN:  Warm and dry, no edema ?NEUROLOGIC:  Alert and oriented x 3. No focal neuro deficits noted. ?PSYCHIATRIC:  Normal affect   ? ?ASSESSMENT:   ? ?No diagnosis found. ?PLAN:   ? ? ?Cardiac risk counseling and prevention recommendations: ?-recommend heart healthy/Mediterranean diet, with whole grains, fruits, vegetable, fish, lean meats, nuts, and olive oil. Limit salt. ?-recommend moderate walking, 3-5 times/week for 30-50 minutes each session. Aim for at least 150 minutes.week. Goal should be pace of 3 miles/hours, or walking 1.5 miles in 30 minutes ?-recommend avoidance of tobacco products. Avoid excess alcohol. ?-ASCVD risk score: ?The ASCVD Risk score (Arnett DK, et al., 2019) failed to calculate for the following reasons: ?  The patient has a prior MI or stroke diagnosis   ? ?Plan for follow up: *** or sooner as needed. ? ?Buford Dresser, MD, PhD, [facility] ?[place]   ? ?Medication Adjustments/Labs and Tests Ordered: ?Current medicines are reviewed at length with the patient today.  Concerns regarding medicines are outlined above.  ? ?No orders of the defined types were placed  in this encounter. ? ?No orders of the defined types were placed in this encounter. ? ?There are no Patient Instructions on file for this visit.  ? ?I,Mathew Stumpf,acting as a Education administrator for [employer]

## 2021-06-22 DIAGNOSIS — Z992 Dependence on renal dialysis: Secondary | ICD-10-CM | POA: Diagnosis not present

## 2021-06-22 DIAGNOSIS — N186 End stage renal disease: Secondary | ICD-10-CM | POA: Diagnosis not present

## 2021-06-22 DIAGNOSIS — E8779 Other fluid overload: Secondary | ICD-10-CM | POA: Diagnosis not present

## 2021-06-24 ENCOUNTER — Ambulatory Visit (HOSPITAL_BASED_OUTPATIENT_CLINIC_OR_DEPARTMENT_OTHER): Payer: Medicare Other | Admitting: Cardiology

## 2021-06-25 DIAGNOSIS — Z992 Dependence on renal dialysis: Secondary | ICD-10-CM | POA: Diagnosis not present

## 2021-06-25 DIAGNOSIS — E8779 Other fluid overload: Secondary | ICD-10-CM | POA: Diagnosis not present

## 2021-06-25 DIAGNOSIS — N186 End stage renal disease: Secondary | ICD-10-CM | POA: Diagnosis not present

## 2021-06-26 DIAGNOSIS — M79671 Pain in right foot: Secondary | ICD-10-CM | POA: Diagnosis not present

## 2021-06-26 DIAGNOSIS — Z992 Dependence on renal dialysis: Secondary | ICD-10-CM | POA: Diagnosis not present

## 2021-06-26 DIAGNOSIS — Z299 Encounter for prophylactic measures, unspecified: Secondary | ICD-10-CM | POA: Diagnosis not present

## 2021-06-26 DIAGNOSIS — I10 Essential (primary) hypertension: Secondary | ICD-10-CM | POA: Diagnosis not present

## 2021-06-27 DIAGNOSIS — E8779 Other fluid overload: Secondary | ICD-10-CM | POA: Diagnosis not present

## 2021-06-27 DIAGNOSIS — N186 End stage renal disease: Secondary | ICD-10-CM | POA: Diagnosis not present

## 2021-06-27 DIAGNOSIS — Z992 Dependence on renal dialysis: Secondary | ICD-10-CM | POA: Diagnosis not present

## 2021-06-28 ENCOUNTER — Ambulatory Visit: Payer: Medicare Other | Admitting: Nurse Practitioner

## 2021-06-29 DIAGNOSIS — E8779 Other fluid overload: Secondary | ICD-10-CM | POA: Diagnosis not present

## 2021-06-29 DIAGNOSIS — Z992 Dependence on renal dialysis: Secondary | ICD-10-CM | POA: Diagnosis not present

## 2021-06-29 DIAGNOSIS — N186 End stage renal disease: Secondary | ICD-10-CM | POA: Diagnosis not present

## 2021-07-01 DIAGNOSIS — M109 Gout, unspecified: Secondary | ICD-10-CM | POA: Diagnosis not present

## 2021-07-01 DIAGNOSIS — I10 Essential (primary) hypertension: Secondary | ICD-10-CM | POA: Diagnosis not present

## 2021-07-01 DIAGNOSIS — E1122 Type 2 diabetes mellitus with diabetic chronic kidney disease: Secondary | ICD-10-CM | POA: Diagnosis not present

## 2021-07-01 DIAGNOSIS — E1165 Type 2 diabetes mellitus with hyperglycemia: Secondary | ICD-10-CM | POA: Diagnosis not present

## 2021-07-01 DIAGNOSIS — Z299 Encounter for prophylactic measures, unspecified: Secondary | ICD-10-CM | POA: Diagnosis not present

## 2021-07-01 DIAGNOSIS — M79671 Pain in right foot: Secondary | ICD-10-CM | POA: Diagnosis not present

## 2021-07-02 DIAGNOSIS — Z992 Dependence on renal dialysis: Secondary | ICD-10-CM | POA: Diagnosis not present

## 2021-07-02 DIAGNOSIS — E8779 Other fluid overload: Secondary | ICD-10-CM | POA: Diagnosis not present

## 2021-07-02 DIAGNOSIS — N186 End stage renal disease: Secondary | ICD-10-CM | POA: Diagnosis not present

## 2021-07-02 DIAGNOSIS — E119 Type 2 diabetes mellitus without complications: Secondary | ICD-10-CM | POA: Diagnosis not present

## 2021-07-03 DIAGNOSIS — R2681 Unsteadiness on feet: Secondary | ICD-10-CM | POA: Diagnosis not present

## 2021-07-03 DIAGNOSIS — M1388 Other specified arthritis, other site: Secondary | ICD-10-CM | POA: Diagnosis not present

## 2021-07-03 DIAGNOSIS — G629 Polyneuropathy, unspecified: Secondary | ICD-10-CM | POA: Diagnosis not present

## 2021-07-04 DIAGNOSIS — E8779 Other fluid overload: Secondary | ICD-10-CM | POA: Diagnosis not present

## 2021-07-04 DIAGNOSIS — I10 Essential (primary) hypertension: Secondary | ICD-10-CM | POA: Diagnosis not present

## 2021-07-04 DIAGNOSIS — E7800 Pure hypercholesterolemia, unspecified: Secondary | ICD-10-CM | POA: Diagnosis not present

## 2021-07-04 DIAGNOSIS — N186 End stage renal disease: Secondary | ICD-10-CM | POA: Diagnosis not present

## 2021-07-04 DIAGNOSIS — Z992 Dependence on renal dialysis: Secondary | ICD-10-CM | POA: Diagnosis not present

## 2021-07-05 DIAGNOSIS — Z992 Dependence on renal dialysis: Secondary | ICD-10-CM | POA: Diagnosis not present

## 2021-07-05 DIAGNOSIS — E1165 Type 2 diabetes mellitus with hyperglycemia: Secondary | ICD-10-CM | POA: Diagnosis not present

## 2021-07-05 DIAGNOSIS — Z299 Encounter for prophylactic measures, unspecified: Secondary | ICD-10-CM | POA: Diagnosis not present

## 2021-07-05 DIAGNOSIS — N186 End stage renal disease: Secondary | ICD-10-CM | POA: Diagnosis not present

## 2021-07-05 DIAGNOSIS — R0602 Shortness of breath: Secondary | ICD-10-CM | POA: Diagnosis not present

## 2021-07-05 DIAGNOSIS — M109 Gout, unspecified: Secondary | ICD-10-CM | POA: Diagnosis not present

## 2021-07-09 ENCOUNTER — Encounter (HOSPITAL_COMMUNITY): Payer: Self-pay

## 2021-07-09 ENCOUNTER — Inpatient Hospital Stay (HOSPITAL_COMMUNITY)
Admission: EM | Admit: 2021-07-09 | Discharge: 2021-07-12 | DRG: 637 | Disposition: A | Discharge status: Skilled Nursing Facility | Payer: Medicare Other | Attending: Internal Medicine | Admitting: Internal Medicine

## 2021-07-09 ENCOUNTER — Emergency Department (HOSPITAL_COMMUNITY): Payer: Medicare Other

## 2021-07-09 ENCOUNTER — Other Ambulatory Visit: Payer: Self-pay

## 2021-07-09 DIAGNOSIS — I499 Cardiac arrhythmia, unspecified: Secondary | ICD-10-CM | POA: Diagnosis not present

## 2021-07-09 DIAGNOSIS — I509 Heart failure, unspecified: Secondary | ICD-10-CM | POA: Diagnosis not present

## 2021-07-09 DIAGNOSIS — G9341 Metabolic encephalopathy: Secondary | ICD-10-CM | POA: Diagnosis present

## 2021-07-09 DIAGNOSIS — Z91158 Patient's noncompliance with renal dialysis for other reason: Secondary | ICD-10-CM

## 2021-07-09 DIAGNOSIS — R0600 Dyspnea, unspecified: Secondary | ICD-10-CM | POA: Diagnosis not present

## 2021-07-09 DIAGNOSIS — E1142 Type 2 diabetes mellitus with diabetic polyneuropathy: Secondary | ICD-10-CM | POA: Diagnosis present

## 2021-07-09 DIAGNOSIS — E1169 Type 2 diabetes mellitus with other specified complication: Secondary | ICD-10-CM | POA: Diagnosis not present

## 2021-07-09 DIAGNOSIS — I120 Hypertensive chronic kidney disease with stage 5 chronic kidney disease or end stage renal disease: Secondary | ICD-10-CM | POA: Diagnosis present

## 2021-07-09 DIAGNOSIS — E162 Hypoglycemia, unspecified: Secondary | ICD-10-CM | POA: Diagnosis not present

## 2021-07-09 DIAGNOSIS — Z6841 Body Mass Index (BMI) 40.0 and over, adult: Secondary | ICD-10-CM | POA: Diagnosis not present

## 2021-07-09 DIAGNOSIS — K649 Unspecified hemorrhoids: Secondary | ICD-10-CM | POA: Diagnosis present

## 2021-07-09 DIAGNOSIS — J9611 Chronic respiratory failure with hypoxia: Secondary | ICD-10-CM | POA: Diagnosis not present

## 2021-07-09 DIAGNOSIS — M109 Gout, unspecified: Secondary | ICD-10-CM | POA: Diagnosis present

## 2021-07-09 DIAGNOSIS — E11649 Type 2 diabetes mellitus with hypoglycemia without coma: Principal | ICD-10-CM | POA: Diagnosis present

## 2021-07-09 DIAGNOSIS — E875 Hyperkalemia: Secondary | ICD-10-CM | POA: Diagnosis not present

## 2021-07-09 DIAGNOSIS — Z992 Dependence on renal dialysis: Secondary | ICD-10-CM | POA: Diagnosis not present

## 2021-07-09 DIAGNOSIS — E1122 Type 2 diabetes mellitus with diabetic chronic kidney disease: Secondary | ICD-10-CM | POA: Diagnosis not present

## 2021-07-09 DIAGNOSIS — Z862 Personal history of diseases of the blood and blood-forming organs and certain disorders involving the immune mechanism: Secondary | ICD-10-CM | POA: Diagnosis present

## 2021-07-09 DIAGNOSIS — R9431 Abnormal electrocardiogram [ECG] [EKG]: Secondary | ICD-10-CM | POA: Diagnosis not present

## 2021-07-09 DIAGNOSIS — Z9981 Dependence on supplemental oxygen: Secondary | ICD-10-CM

## 2021-07-09 DIAGNOSIS — Z79899 Other long term (current) drug therapy: Secondary | ICD-10-CM | POA: Diagnosis not present

## 2021-07-09 DIAGNOSIS — N2581 Secondary hyperparathyroidism of renal origin: Secondary | ICD-10-CM | POA: Diagnosis present

## 2021-07-09 DIAGNOSIS — E8770 Fluid overload, unspecified: Secondary | ICD-10-CM | POA: Diagnosis present

## 2021-07-09 DIAGNOSIS — Z7982 Long term (current) use of aspirin: Secondary | ICD-10-CM

## 2021-07-09 DIAGNOSIS — Z743 Need for continuous supervision: Secondary | ICD-10-CM | POA: Diagnosis not present

## 2021-07-09 DIAGNOSIS — Z7984 Long term (current) use of oral hypoglycemic drugs: Secondary | ICD-10-CM

## 2021-07-09 DIAGNOSIS — N186 End stage renal disease: Secondary | ICD-10-CM | POA: Diagnosis present

## 2021-07-09 DIAGNOSIS — J449 Chronic obstructive pulmonary disease, unspecified: Secondary | ICD-10-CM | POA: Diagnosis present

## 2021-07-09 DIAGNOSIS — N189 Chronic kidney disease, unspecified: Secondary | ICD-10-CM | POA: Diagnosis present

## 2021-07-09 DIAGNOSIS — Z794 Long term (current) use of insulin: Secondary | ICD-10-CM | POA: Diagnosis present

## 2021-07-09 DIAGNOSIS — T50995A Adverse effect of other drugs, medicaments and biological substances, initial encounter: Secondary | ICD-10-CM | POA: Diagnosis present

## 2021-07-09 DIAGNOSIS — M7989 Other specified soft tissue disorders: Secondary | ICD-10-CM | POA: Diagnosis not present

## 2021-07-09 DIAGNOSIS — I160 Hypertensive urgency: Secondary | ICD-10-CM | POA: Diagnosis present

## 2021-07-09 DIAGNOSIS — R6889 Other general symptoms and signs: Secondary | ICD-10-CM | POA: Diagnosis not present

## 2021-07-09 DIAGNOSIS — R4020 Unspecified coma: Secondary | ICD-10-CM | POA: Diagnosis not present

## 2021-07-09 DIAGNOSIS — Z833 Family history of diabetes mellitus: Secondary | ICD-10-CM | POA: Diagnosis not present

## 2021-07-09 DIAGNOSIS — E7800 Pure hypercholesterolemia, unspecified: Secondary | ICD-10-CM | POA: Diagnosis present

## 2021-07-09 DIAGNOSIS — D638 Anemia in other chronic diseases classified elsewhere: Secondary | ICD-10-CM | POA: Diagnosis present

## 2021-07-09 DIAGNOSIS — D631 Anemia in chronic kidney disease: Secondary | ICD-10-CM | POA: Diagnosis present

## 2021-07-09 DIAGNOSIS — I517 Cardiomegaly: Secondary | ICD-10-CM | POA: Diagnosis not present

## 2021-07-09 DIAGNOSIS — E785 Hyperlipidemia, unspecified: Secondary | ICD-10-CM | POA: Diagnosis present

## 2021-07-09 HISTORY — DX: Gout, unspecified: M10.9

## 2021-07-09 HISTORY — DX: Unspecified osteoarthritis, unspecified site: M19.90

## 2021-07-09 HISTORY — DX: Unspecified complication of cardiac and vascular prosthetic device, implant and graft, subsequent encounter: T82.9XXD

## 2021-07-09 LAB — GLUCOSE, CAPILLARY
Glucose-Capillary: 101 mg/dL — ABNORMAL HIGH (ref 70–99)
Glucose-Capillary: 22 mg/dL — CL (ref 70–99)
Glucose-Capillary: 39 mg/dL — CL (ref 70–99)
Glucose-Capillary: 45 mg/dL — ABNORMAL LOW (ref 70–99)
Glucose-Capillary: 46 mg/dL — ABNORMAL LOW (ref 70–99)
Glucose-Capillary: 48 mg/dL — ABNORMAL LOW (ref 70–99)
Glucose-Capillary: 53 mg/dL — ABNORMAL LOW (ref 70–99)
Glucose-Capillary: 70 mg/dL (ref 70–99)
Glucose-Capillary: 73 mg/dL (ref 70–99)
Glucose-Capillary: 80 mg/dL (ref 70–99)
Glucose-Capillary: 80 mg/dL (ref 70–99)

## 2021-07-09 LAB — CBG MONITORING, ED
Glucose-Capillary: 229 mg/dL — ABNORMAL HIGH (ref 70–99)
Glucose-Capillary: 102 mg/dL — ABNORMAL HIGH (ref 70–99)
Glucose-Capillary: 37 mg/dL — CL (ref 70–99)
Glucose-Capillary: 39 mg/dL — CL (ref 70–99)
Glucose-Capillary: 61 mg/dL — ABNORMAL LOW (ref 70–99)
Glucose-Capillary: 32 mg/dL — CL (ref 70–99)
Glucose-Capillary: 38 mg/dL — CL (ref 70–99)
Glucose-Capillary: 185 mg/dL — ABNORMAL HIGH (ref 70–99)

## 2021-07-09 LAB — CBC WITH DIFFERENTIAL/PLATELET
Abs Immature Granulocytes: 0.04 10*3/uL (ref 0.00–0.07)
Basophils Absolute: 0 10*3/uL (ref 0.0–0.1)
Basophils Relative: 0 %
Eosinophils Absolute: 0.1 10*3/uL (ref 0.0–0.5)
Eosinophils Relative: 1 %
HCT: 27.1 % — ABNORMAL LOW (ref 36.0–46.0)
Hemoglobin: 7.7 g/dL — ABNORMAL LOW (ref 12.0–15.0)
Immature Granulocytes: 1 %
Lymphocytes Relative: 6 %
Lymphs Abs: 0.4 10*3/uL — ABNORMAL LOW (ref 0.7–4.0)
MCH: 29.1 pg (ref 26.0–34.0)
MCHC: 28.4 g/dL — ABNORMAL LOW (ref 30.0–36.0)
MCV: 102.3 fL — ABNORMAL HIGH (ref 80.0–100.0)
Monocytes Absolute: 0.6 10*3/uL (ref 0.1–1.0)
Monocytes Relative: 7 %
Neutro Abs: 6.6 10*3/uL (ref 1.7–7.7)
Neutrophils Relative %: 85 %
Platelets: 207 10*3/uL (ref 150–400)
RBC: 2.65 MIL/uL — ABNORMAL LOW (ref 3.87–5.11)
RDW: 17.1 % — ABNORMAL HIGH (ref 11.5–15.5)
WBC: 7.7 10*3/uL (ref 4.0–10.5)
nRBC: 0 % (ref 0.0–0.2)

## 2021-07-09 LAB — URIC ACID: Uric Acid, Serum: 9.2 mg/dL — ABNORMAL HIGH (ref 2.5–7.1)

## 2021-07-09 LAB — BASIC METABOLIC PANEL
Anion gap: 19 — ABNORMAL HIGH (ref 5–15)
BUN: 108 mg/dL — ABNORMAL HIGH (ref 6–20)
CO2: 21 mmol/L — ABNORMAL LOW (ref 22–32)
Calcium: 9 mg/dL (ref 8.9–10.3)
Chloride: 102 mmol/L (ref 98–111)
Creatinine, Ser: 18.98 mg/dL — ABNORMAL HIGH (ref 0.44–1.00)
GFR, Estimated: 2 mL/min — ABNORMAL LOW (ref >60)
Glucose, Bld: 60 mg/dL — ABNORMAL LOW (ref 70–99)
Potassium: 6.3 mmol/L (ref 3.5–5.1)
Sodium: 142 mmol/L (ref 135–145)

## 2021-07-09 LAB — BRAIN NATRIURETIC PEPTIDE: B Natriuretic Peptide: 1448 pg/mL — ABNORMAL HIGH (ref 0.0–100.0)

## 2021-07-09 LAB — GLUCOSE, RANDOM: Glucose, Bld: 75 mg/dL (ref 70–99)

## 2021-07-09 MED ORDER — ALTEPLASE 2 MG IJ SOLR
2.0000 mg | Freq: Once | INTRAMUSCULAR | Status: DC | PRN
  Filled 2021-07-09: qty 2

## 2021-07-09 MED ORDER — DEXTROSE 50 % IV SOLN
25.0000 g | INTRAVENOUS | Status: DC | PRN
  Administered 2021-07-09 – 2021-07-10 (×7): 25 g via INTRAVENOUS
  Filled 2021-07-09: qty 100
  Filled 2021-07-09 (×2): qty 50
  Filled 2021-07-09: qty 150

## 2021-07-09 MED ORDER — DIPHENHYDRAMINE HCL 25 MG PO CAPS
25.0000 mg | ORAL_CAPSULE | Freq: Every day | ORAL | Status: DC | PRN
  Administered 2021-07-10 – 2021-07-12 (×3): 25 mg via ORAL
  Filled 2021-07-09 (×3): qty 1

## 2021-07-09 MED ORDER — HYDROCODONE-ACETAMINOPHEN 5-325 MG PO TABS
2.0000 | ORAL_TABLET | Freq: Once | ORAL | Status: AC
  Administered 2021-07-09: 2 via ORAL
  Filled 2021-07-09: qty 2

## 2021-07-09 MED ORDER — PANTOPRAZOLE SODIUM 40 MG PO TBEC
40.0000 mg | DELAYED_RELEASE_TABLET | Freq: Every day | ORAL | Status: DC
  Administered 2021-07-12: 40 mg via ORAL
  Filled 2021-07-09 (×3): qty 1

## 2021-07-09 MED ORDER — INSULIN GLARGINE-YFGN 100 UNIT/ML ~~LOC~~ SOLN
12.0000 [IU] | Freq: Every day | SUBCUTANEOUS | Status: DC
  Filled 2021-07-09 (×2): qty 0.12

## 2021-07-09 MED ORDER — SODIUM CHLORIDE 0.9 % IV SOLN: 250.0000 mL | INTRAVENOUS | Status: DC | PRN

## 2021-07-09 MED ORDER — HYDRALAZINE HCL 20 MG/ML IJ SOLN: 10.0000 mg | INTRAMUSCULAR | Status: DC | PRN

## 2021-07-09 MED ORDER — CARVEDILOL 12.5 MG PO TABS
25.0000 mg | ORAL_TABLET | Freq: Two times a day (BID) | ORAL | Status: DC
  Administered 2021-07-10 – 2021-07-12 (×4): 25 mg via ORAL
  Filled 2021-07-09 (×4): qty 2

## 2021-07-09 MED ORDER — DEXTROSE 50 % IV SOLN
1.0000 | Freq: Once | INTRAVENOUS | Status: AC
  Administered 2021-07-09: 50 mL via INTRAVENOUS

## 2021-07-09 MED ORDER — DEXTROSE 50 % IV SOLN
INTRAVENOUS | Status: AC
Start: 2021-07-09 — End: 2021-07-09
  Administered 2021-07-09: 50 mL via INTRAVENOUS
  Filled 2021-07-09: qty 50

## 2021-07-09 MED ORDER — IPRATROPIUM BROMIDE 0.02 % IN SOLN
0.5000 mg | Freq: Four times a day (QID) | RESPIRATORY_TRACT | Status: DC | PRN
  Administered 2021-07-09: 0.5 mg via RESPIRATORY_TRACT
  Filled 2021-07-09: qty 2.5

## 2021-07-09 MED ORDER — LEVALBUTEROL HCL 0.63 MG/3ML IN NEBU: 0.6300 mg | INHALATION_SOLUTION | Freq: Four times a day (QID) | RESPIRATORY_TRACT | Status: DC | PRN

## 2021-07-09 MED ORDER — DEXTROSE 10 % IV SOLN: INTRAVENOUS | Status: DC

## 2021-07-09 MED ORDER — GABAPENTIN 100 MG PO CAPS
100.0000 mg | ORAL_CAPSULE | ORAL | Status: DC | PRN
  Administered 2021-07-11: 100 mg via ORAL

## 2021-07-09 MED ORDER — SODIUM CHLORIDE 0.9% FLUSH
3.0000 mL | Freq: Two times a day (BID) | INTRAVENOUS | Status: DC
  Administered 2021-07-09 – 2021-07-12 (×6): 3 mL via INTRAVENOUS

## 2021-07-09 MED ORDER — CALCIUM CARBONATE ANTACID 500 MG PO CHEW
1500.0000 mg | CHEWABLE_TABLET | Freq: Three times a day (TID) | ORAL | Status: DC
  Administered 2021-07-10: 1500 mg via ORAL
  Filled 2021-07-09: qty 8

## 2021-07-09 MED ORDER — DEXTROSE 10 % IV SOLN
INTRAVENOUS | Status: DC
Start: 2021-07-09 — End: 2021-07-09

## 2021-07-09 MED ORDER — BISACODYL 5 MG PO TBEC: 5.0000 mg | DELAYED_RELEASE_TABLET | Freq: Every day | ORAL | Status: DC | PRN

## 2021-07-09 MED ORDER — ATORVASTATIN CALCIUM 10 MG PO TABS
10.0000 mg | ORAL_TABLET | Freq: Every day | ORAL | Status: DC
  Administered 2021-07-11: 10 mg via ORAL
  Filled 2021-07-09: qty 1

## 2021-07-09 MED ORDER — SODIUM CHLORIDE 0.9 % IV SOLN: 100.0000 mL | INTRAVENOUS | Status: DC | PRN

## 2021-07-09 MED ORDER — HEPARIN SODIUM (PORCINE) 5000 UNIT/ML IJ SOLN
5000.0000 [IU] | Freq: Three times a day (TID) | INTRAMUSCULAR | Status: DC
  Administered 2021-07-09 – 2021-07-12 (×7): 5000 [IU] via SUBCUTANEOUS
  Filled 2021-07-09 (×8): qty 1

## 2021-07-09 MED ORDER — ASPIRIN EC 81 MG PO TBEC
81.0000 mg | DELAYED_RELEASE_TABLET | Freq: Every day | ORAL | Status: DC
  Administered 2021-07-10 – 2021-07-12 (×2): 81 mg via ORAL
  Filled 2021-07-09 (×3): qty 1

## 2021-07-09 MED ORDER — NITROGLYCERIN 0.4 MG SL SUBL: 0.4000 mg | SUBLINGUAL_TABLET | SUBLINGUAL | Status: DC | PRN

## 2021-07-09 MED ORDER — CHLORHEXIDINE GLUCONATE CLOTH 2 % EX PADS: 6.0000 | MEDICATED_PAD | Freq: Every day | CUTANEOUS | Status: DC

## 2021-07-09 MED ORDER — DEXTROSE 50 % IV SOLN
50.0000 mL | Freq: Once | INTRAVENOUS | Status: AC
  Administered 2021-07-09: 50 mL via INTRAVENOUS

## 2021-07-09 MED ORDER — HYDROCORTISONE (PERIANAL) 2.5 % EX CREA
1.0000 "application " | TOPICAL_CREAM | Freq: Every day | CUTANEOUS | Status: DC | PRN
  Filled 2021-07-09: qty 28.35

## 2021-07-09 MED ORDER — AMLODIPINE BESYLATE 5 MG PO TABS
10.0000 mg | ORAL_TABLET | Freq: Once | ORAL | Status: AC
  Administered 2021-07-09: 10 mg via ORAL
  Filled 2021-07-09: qty 2

## 2021-07-09 MED ORDER — PENTAFLUOROPROP-TETRAFLUOROETH EX AERO: 1.0000 "application " | INHALATION_SPRAY | CUTANEOUS | Status: DC | PRN

## 2021-07-09 MED ORDER — IPRATROPIUM-ALBUTEROL 0.5-2.5 (3) MG/3ML IN SOLN
3.0000 mL | Freq: Once | RESPIRATORY_TRACT | Status: AC
  Administered 2021-07-09: 3 mL via RESPIRATORY_TRACT
  Filled 2021-07-09: qty 3

## 2021-07-09 MED ORDER — AMLODIPINE BESYLATE 5 MG PO TABS
10.0000 mg | ORAL_TABLET | Freq: Every day | ORAL | Status: DC
  Administered 2021-07-12: 10 mg via ORAL
  Filled 2021-07-09 (×3): qty 2

## 2021-07-09 MED ORDER — INSULIN GLARGINE 100 UNIT/ML SOLOSTAR PEN: 12.0000 [IU] | PEN_INJECTOR | Freq: Every day | SUBCUTANEOUS | Status: DC

## 2021-07-09 MED ORDER — GABAPENTIN 100 MG PO CAPS
100.0000 mg | ORAL_CAPSULE | Freq: Three times a day (TID) | ORAL | Status: DC
  Administered 2021-07-10 (×2): 100 mg via ORAL
  Filled 2021-07-09 (×3): qty 1

## 2021-07-09 MED ORDER — LIDOCAINE-PRILOCAINE 2.5-2.5 % EX CREA: 1.0000 "application " | TOPICAL_CREAM | CUTANEOUS | Status: DC | PRN

## 2021-07-09 MED ORDER — SODIUM CHLORIDE 0.9% FLUSH
3.0000 mL | Freq: Two times a day (BID) | INTRAVENOUS | Status: DC
  Administered 2021-07-09 – 2021-07-12 (×2): 3 mL via INTRAVENOUS

## 2021-07-09 MED ORDER — ONDANSETRON HCL 4 MG PO TABS: 4.0000 mg | ORAL_TABLET | Freq: Four times a day (QID) | ORAL | Status: DC | PRN

## 2021-07-09 MED ORDER — LABETALOL HCL 5 MG/ML IV SOLN: 10.0000 mg | INTRAVENOUS | Status: DC | PRN

## 2021-07-09 MED ORDER — HYDROMORPHONE HCL 1 MG/ML IJ SOLN
0.5000 mg | INTRAMUSCULAR | Status: DC | PRN
  Administered 2021-07-09 (×2): 1 mg via INTRAVENOUS
  Filled 2021-07-09 (×3): qty 1

## 2021-07-09 MED ORDER — LIDOCAINE HCL (PF) 1 % IJ SOLN: 5.0000 mL | INTRAMUSCULAR | Status: DC | PRN

## 2021-07-09 MED ORDER — DOCUSATE SODIUM 100 MG PO CAPS
100.0000 mg | ORAL_CAPSULE | Freq: Two times a day (BID) | ORAL | Status: DC
  Administered 2021-07-12: 100 mg via ORAL
  Filled 2021-07-09 (×4): qty 1

## 2021-07-09 MED ORDER — CALCIUM CARBONATE ANTACID 500 MG PO CHEW: 750.0000 mg | CHEWABLE_TABLET | ORAL | Status: DC

## 2021-07-09 MED ORDER — CARVEDILOL 12.5 MG PO TABS
25.0000 mg | ORAL_TABLET | Freq: Once | ORAL | Status: AC
  Administered 2021-07-09: 25 mg via ORAL
  Filled 2021-07-09: qty 2

## 2021-07-09 MED ORDER — DEXTROSE 50 % IV SOLN
25.0000 g | INTRAVENOUS | Status: AC
  Administered 2021-07-09: 25 g via INTRAVENOUS
  Filled 2021-07-09: qty 50

## 2021-07-09 MED ORDER — HEPARIN SODIUM (PORCINE) 1000 UNIT/ML DIALYSIS
1000.0000 [IU] | INTRAMUSCULAR | Status: DC | PRN
  Administered 2021-07-11: 3800 [IU] via INTRAVENOUS_CENTRAL

## 2021-07-09 MED ORDER — DEXTROSE 50 % IV SOLN: 50.0000 mL | Freq: Once | INTRAVENOUS | Status: AC

## 2021-07-09 MED ORDER — HYDROCODONE-ACETAMINOPHEN 5-325 MG PO TABS
1.0000 | ORAL_TABLET | Freq: Three times a day (TID) | ORAL | Status: DC | PRN
  Administered 2021-07-10 – 2021-07-11 (×2): 1 via ORAL
  Filled 2021-07-09 (×2): qty 1

## 2021-07-09 MED ORDER — SODIUM ZIRCONIUM CYCLOSILICATE 5 G PO PACK
10.0000 g | PACK | Freq: Once | ORAL | Status: AC
  Administered 2021-07-09: 10 g via ORAL
  Filled 2021-07-09: qty 2

## 2021-07-09 MED ORDER — ACETAMINOPHEN 325 MG PO TABS
650.0000 mg | ORAL_TABLET | Freq: Four times a day (QID) | ORAL | Status: DC | PRN
Start: 2021-07-09 — End: 2021-07-12
  Administered 2021-07-12: 650 mg via ORAL
  Filled 2021-07-09 (×2): qty 2

## 2021-07-09 MED ORDER — INSULIN ASPART 100 UNIT/ML IJ SOLN: 0.0000 [IU] | Freq: Three times a day (TID) | INTRAMUSCULAR | Status: DC

## 2021-07-09 MED ORDER — HYDROMORPHONE HCL 1 MG/ML IJ SOLN
1.0000 mg | INTRAMUSCULAR | Status: DC | PRN
  Administered 2021-07-09 – 2021-07-10 (×5): 1 mg via INTRAVENOUS
  Filled 2021-07-09 (×5): qty 1

## 2021-07-09 MED ORDER — GABAPENTIN 100 MG PO CAPS
100.0000 mg | ORAL_CAPSULE | ORAL | Status: DC
  Administered 2021-07-11: 100 mg via ORAL
  Filled 2021-07-09: qty 1

## 2021-07-09 MED ORDER — GLUCAGON HCL RDNA (DIAGNOSTIC) 1 MG IJ SOLR
1.0000 mg | Freq: Once | INTRAMUSCULAR | Status: AC
  Administered 2021-07-09: 1 mg via INTRAMUSCULAR
  Filled 2021-07-09: qty 1

## 2021-07-09 MED ORDER — ONDANSETRON HCL 4 MG/2ML IJ SOLN
4.0000 mg | Freq: Four times a day (QID) | INTRAMUSCULAR | Status: DC | PRN
  Administered 2021-07-09 – 2021-07-10 (×2): 4 mg via INTRAVENOUS
  Filled 2021-07-09 (×2): qty 2

## 2021-07-09 MED ORDER — TRAZODONE HCL 50 MG PO TABS: 25.0000 mg | ORAL_TABLET | Freq: Every evening | ORAL | Status: DC | PRN

## 2021-07-09 MED ORDER — SODIUM CHLORIDE 0.9% FLUSH
3.0000 mL | INTRAVENOUS | Status: DC | PRN
Start: 2021-07-09 — End: 2021-07-12

## 2021-07-09 MED ORDER — ACETAMINOPHEN 650 MG RE SUPP: 650.0000 mg | Freq: Four times a day (QID) | RECTAL | Status: DC | PRN

## 2021-07-09 NOTE — Assessment & Plan Note (Signed)
-   Nonbleeding continue rectal steroids ?

## 2021-07-09 NOTE — Hospital Course (Addendum)
? ?  Courtney Gray is a 51 year old African-American female with significant history of morbid obesity, DM 2, HTN, HLD, chronic respiratory failure on 4 L at home , ESRD hemodialysis on Tuesday -Thursdays - Saturdays... Presenting today as she missed her hemodialysis on Saturday. ?She was supposed to go today, that again as she was not feeling well, was having neuropathic foot pain, ? ?Patient reports she was not feeling well this morning, was hypoglycemic for unknown reason.  Her blood sugar on arrival was 44 1 amp of D50 was given, blood sugar has improved to 133, ? ?She did not take her home medication this morning...  ? ?-Patient was admitted with uncontrolled hypertension as well as hyperkalemia in the setting of missed hemodialysis.  She was also noted to be hypoglycemic per daughter at bedside.  She has undergone hemodialysis on 4/4 and this was uneventful.  Unfortunately, she has remained quite hypoglycemic overnight requiring D10 infusion.  She continues to have some periods of ongoing confusion overnight and into this morning.  Blood glucose levels have stabilized. ?

## 2021-07-09 NOTE — Procedures (Signed)
? ?  HEMODIALYSIS TREATMENT NOTE: ? ? ?Pt was transferred from ED to HD unit for emergent HD session r/t hyperkalemia.  spO2 100% on 3L O2 via [place] (on home O2 3L chronically).  Drowsy but oriented.  CNA who transported pt reported last CBG was 185. ? ?One hour into treatment pt was noted to be increasingly restless, hypoxic and hypoglycemic--CBG 23, required NRB to keep sats>90.  One amp D50 was given, HD was stopped, and pt was transferred to ICU.  After she stabilized (two more amps of D50 + initiation of D10 infusion), HD was resumed and tx was completed. ? ?Blood pressures are being taken in pt's left ankle and the readings are widely variable and questionable. ? ? ?Total run time: 3:30 (3 hours total on 1K, as ordered, last 30 minutes on 2K) ?Net UF 742 + 3000  = 3.7 liters. ? ? ? ?[name], RN ? ?

## 2021-07-09 NOTE — Assessment & Plan Note (Signed)
-   In setting of end-stage renal disease, missed hemodialysis today ?-Pursuing hemodialysis today ?

## 2021-07-09 NOTE — Assessment & Plan Note (Signed)
-   Missed hemodialysis today, ?-Resuming hemodialysis ?

## 2021-07-09 NOTE — Progress Notes (Signed)
Inpatient Diabetes Program Recommendations ? ?AACE/ADA: New Consensus Statement on Inpatient Glycemic Control (2015) ? ?Target Ranges:  Prepandial:   less than 140 mg/dL ?     Peak postprandial:   less than 180 mg/dL (1-2 hours) ?     Critically ill patients:  140 - 180 mg/dL  ? ?Lab Results  ?Component Value Date  ? GLUCAP 229 (H) 07/09/2021  ? HGBA1C 8.9 (H) 04/23/2021  ? ? ?Review of Glycemic Control ? Latest Reference Range & Units 07/09/21 10:40  ?Glucose-Capillary 70 - 99 mg/dL 229 (H)  ?(H): Data is abnormally high ?Diabetes history: Type 2 DM ?Outpatient Diabetes medications: Lantus 12 units QHS ?Current orders for Inpatient glycemic control: none ? ?Inpatient Diabetes Program Recommendations:   ?Noted hypoglycemia per EMS note. Assuming related to renal status in the setting of missed HD.  ?Consider adding CBGs Q4H.  ? ?Thanks, ?Bronson Curb, MSN, RNC-OB ?Diabetes Coordinator ?[phone] (8a-5p) ? ? ? ?

## 2021-07-09 NOTE — Assessment & Plan Note (Signed)
-   Counseled regarding weight loss, follow-up PCP, ?Healthy diet and exercise ?Follow-up with PCP regarding weight loss ? ? ?

## 2021-07-09 NOTE — Assessment & Plan Note (Signed)
-  Denies have having any headache, visual changes, or focal neurological findings ?- Systolic blood pressure> 585 on arrival, diastolic>100 ?-Patient is received home medication including Norvasc, Coreg ?-As needed labetalol ordered ?-Titrating on lowering blood pressure slowly ?

## 2021-07-09 NOTE — Assessment & Plan Note (Signed)
-   Monitoring H&H closely ?Currently stable ?- ?

## 2021-07-09 NOTE — ED Provider Notes (Signed)
?[facility] ?Provider Note ? ? ?CSN: [id number] ?Arrival date & time: 07/09/21  1022 ? ?  ? ?History ? ?Chief Complaint  ?Patient presents with  ? Hypoglycemia  ? ? ?Courtney Gray is a 51 y.o. female. ? ?Patient has a history of hypertension diabetes renal failure, patient was altered this morning and the family could not wake her up.  Her sugar was low.  They gave her some glucose and she improved and called the paramedics to give her more glucose.  She states that she started an over-the-counter medicine for neuritis for her gout in her foot and the side effect is dropping her sugar.  She missed her last dialysis today and today she was scheduled to get it done but did not go. ? ?The history is provided by the patient and a relative. No language interpreter was used.  ?Altered Mental Status ?Presenting symptoms: behavior changes and confusion   ?Severity:  Severe ?Most recent episode:  Today ?Episode history:  Multiple ?Timing:  Intermittent ?Progression:  Resolved ?Chronicity:  New ?Context: not alcohol use   ?Associated symptoms: no abdominal pain, no hallucinations, no headaches, no rash and no seizures   ? ?  ? ?Home Medications ?Prior to Admission medications   ?Medication Sig Start Date End Date Taking? Authorizing Provider  ?acetaminophen (TYLENOL) 500 MG tablet Take 1,000 mg by mouth every 6 (six) hours as needed for moderate pain or headache.   Yes [provider]  ?amLODipine (NORVASC) 10 MG tablet Take 1 tablet (10 mg total) by mouth daily. ?Patient taking differently: Take 10 mg by mouth daily as needed (Blood pressusre). 11/29/20  Yes Roxan Hockey, MD  ?aspirin EC 81 MG tablet Take 1 tablet (81 mg total) by mouth daily with breakfast. 11/28/20  Yes Emokpae, Courage, MD  ?atorvastatin (LIPITOR) 10 MG tablet Take 1 tablet (10 mg total) by mouth daily. ?Patient taking differently: Take 10 mg by mouth at bedtime. 11/28/20  Yes Roxan Hockey, MD  ?calcium carbonate (TUMS  SMOOTHIES) 750 MG chewable tablet Chew 750-1,500 mg by mouth See admin instructions. Take 1500 mg with each meal and 750 mg with each snack   Yes [provider]  ?carvedilol (COREG) 25 MG tablet Take 1 tablet (25 mg total) by mouth 2 (two) times daily. ?Patient taking differently: Take 25 mg by mouth 2 (two) times daily. Do not take on dialysis days Tuesday Thur. And Sat. Take as needed. Take if blood pressure is over 140 11/28/20  Yes Emokpae, Courage, MD  ?diphenhydrAMINE (BENADRYL) 25 MG tablet Take 25 mg by mouth daily as needed for allergies or sleep.   Yes [provider]  ?docusate sodium (COLACE) 100 MG capsule Take 100 mg by mouth 2 (two) times daily.   Yes [provider]  ?gabapentin (NEURONTIN) 100 MG capsule Take 100-200 mg by mouth See admin instructions. 1 tablet in the morning and 1 tablet around 5pm then take 1 tablet at bedtime. Dialysis days take 2 capsules at 5pm 04/20/19  Yes [provider]  ?glipiZIDE (GLUCOTROL XL) 2.5 MG 24 hr tablet Take 2.5 mg by mouth daily with breakfast.   Yes [provider]  ?HYDROcodone-acetaminophen (NORCO/VICODIN) 5-325 MG tablet Take 1 tablet by mouth 3 (three) times daily as needed. 05/29/21  Yes [provider]  ?LANTUS SOLOSTAR 100 UNIT/ML Solostar Pen Inject 12 Units into the skin at bedtime. 03/18/19  Yes [provider]  ?loperamide (IMODIUM A-D) 2 MG tablet Take 4 mg by  mouth as needed for diarrhea or loose stools.   Yes [provider]  ?nitroGLYCERIN (NITROSTAT) 0.4 MG SL tablet Place 1 tablet (0.4 mg total) under the tongue every 5 (five) minutes as needed for chest pain. 05/24/21  Yes [place] Donnamarie Poag, DO  ?pantoprazole (PROTONIX) 40 MG tablet Take 1 tablet (40 mg total) by mouth daily. 11/29/20  Yes Roxan Hockey, MD  ?Phenol (CHLORASEPTIC MT) Use as directed 4 sprays in the mouth or throat 4 (four) times daily as needed (throat pain).   Yes [provider]  ?simethicone  (MYLICON) 778 MG chewable tablet Chew 125 mg by mouth 2 (two) times daily as needed for flatulence.   Yes [provider]  ?tiZANidine (ZANAFLEX) 4 MG tablet Take 4 mg by mouth every 8 (eight) hours. Take with gabapentin at same time 04/20/19  Yes [provider]  ?predniSONE (STERAPRED UNI-PAK 21 TAB) 5 MG (21) TBPK tablet Take 5 mg by mouth See admin instructions. Take as directed 6,5,4 ,3,2,1 ?Patient not taking: Reported on 07/09/2021 06/07/21   [provider]  ?   ? ?Allergies    ?Benzonatate, Hydralazine, Amoxicillin, Clonidine, Morphine, and Oxycodone   ? ?Review of Systems   ?Review of Systems  ?Constitutional:  Negative for appetite change and fatigue.  ?HENT:  Negative for congestion, ear discharge and sinus pressure.   ?Eyes:  Negative for discharge.  ?Respiratory:  Negative for cough.   ?Cardiovascular:  Negative for chest pain.  ?Gastrointestinal:  Negative for abdominal pain and diarrhea.  ?Genitourinary:  Negative for frequency and hematuria.  ?Musculoskeletal:  Negative for back pain.  ?Skin:  Negative for rash.  ?Neurological:  Negative for seizures and headaches.  ?Psychiatric/Behavioral:  Positive for confusion. Negative for hallucinations.   ? ?Physical Exam ?Updated Vital Signs ?BP (!) 208/114 (BP Location: Left Arm)   Pulse 71   Temp 97.9 ?F (36.6 ?C) (Oral)   Resp 20   Ht 5\' 5"  (1.651 m)   Wt (!) 167.8 kg   SpO2 100%   BMI 61.57 kg/m?  ?Physical Exam ?Vitals reviewed.  ?Constitutional:   ?   Appearance: She is well-developed.  ?HENT:  ?   Head: Normocephalic.  ?   Nose: Nose normal.  ?Eyes:  ?   General: No scleral icterus. ?   Conjunctiva/sclera: Conjunctivae normal.  ?Neck:  ?   Thyroid: No thyromegaly.  ?Cardiovascular:  ?   Rate and Rhythm: Normal rate and regular rhythm.  ?   Heart sounds: No murmur heard. ?  No friction rub. No gallop.  ?Pulmonary:  ?   Breath sounds: No stridor. No wheezing or rales.  ?Chest:  ?   Chest wall: No tenderness.  ?Abdominal:  ?    General: There is no distension.  ?   Tenderness: There is no abdominal tenderness. There is no rebound.  ?Musculoskeletal:     ?   General: Normal range of motion.  ?   Cervical back: Neck supple.  ?Lymphadenopathy:  ?   Cervical: No cervical adenopathy.  ?Skin: ?   Findings: No erythema or rash.  ?Neurological:  ?   Mental Status: She is oriented to person, place, and time.  ?   Motor: No abnormal muscle tone.  ?   Coordination: Coordination normal.  ?Psychiatric:     ?   Behavior: Behavior normal.  ? ? ?ED Results / Procedures / Treatments   ?Labs ?(all labs ordered are listed, but only abnormal results are displayed) ?Labs Reviewed  ?  CBC WITH DIFFERENTIAL/PLATELET - Abnormal; Notable for the following components:  ?    Result Value  ? RBC 2.65 (*)   ? Hemoglobin 7.7 (*)   ? HCT 27.1 (*)   ? MCV 102.3 (*)   ? MCHC 28.4 (*)   ? RDW 17.1 (*)   ? Lymphs Abs 0.4 (*)   ? All other components within normal limits  ?BASIC METABOLIC PANEL - Abnormal; Notable for the following components:  ? Potassium 6.3 (*)   ? CO2 21 (*)   ? Glucose, Bld 60 (*)   ? BUN 108 (*)   ? Creatinine, Ser 18.98 (*)   ? GFR, Estimated 2 (*)   ? Anion gap 19 (*)   ? All other components within normal limits  ?URIC ACID - Abnormal; Notable for the following components:  ? Uric Acid, Serum 9.2 (*)   ? All other components within normal limits  ?CBG MONITORING, ED - Abnormal; Notable for the following components:  ? Glucose-Capillary 229 (*)   ? All other components within normal limits  ?CBG MONITORING, ED - Abnormal; Notable for the following components:  ? Glucose-Capillary 39 (*)   ? All other components within normal limits  ?BRAIN NATRIURETIC PEPTIDE  ? ? ?EKG ?EKG Interpretation ? ?Date/Time:  Tuesday July 09 2021 11:00:49 EDT ?Ventricular Rate:  67 ?PR Interval:  172 ?QRS Duration: 127 ?QT Interval:  443 ?QTC Calculation: 468 ?R Axis:     ?Text Interpretation: [facility]AS[facility] Derived Leads Confirmed by Milton Ferguson [phone]) on 07/09/2021 11:46:45  AM ? ?Radiology ?DG Chest Port 1 View ? ?Result Date: 07/09/2021 ?CLINICAL DATA:  Dyspnea EXAM: PORTABLE CHEST 1 VIEW COMPARISON:  06/10/2021 chest radiograph. FINDINGS: Right internal jugular central venous catheter

## 2021-07-09 NOTE — Assessment & Plan Note (Addendum)
-   We will resume home medication of glipizide ?-We will check her blood sugar q. [place] with SSI coverage ?-Hemoglobin A1c 8.9 in January 2023 ?

## 2021-07-09 NOTE — Progress Notes (Signed)
I was informed that this patient is in the ER with low sugar and gout-  will be admitted-  is TTS at [facility] but missed Sat and today.  K is 6.3, BUN of 108 and is volume overloaded.  Will look to get her a treatment here in the next 24 hours - will be seen in person tomorrow  ? ? ?Dialysis Orders: as of Feb 2023 ?TTS [name] ? 4h 30min   400/500  165kg  1K/2.5Ca bath  TDC /(not using AVF) Hep 2600 + 1500 unit /hr ? - rocaltrol 1.0 ug tiw ? - mircera 200 ug q 2 wks ? - venofer 50 mg weekly  ? ?Louis Meckel ? ?

## 2021-07-09 NOTE — Assessment & Plan Note (Signed)
-   Continue statins -atorvastatin 10 mg daily ?

## 2021-07-09 NOTE — Assessment & Plan Note (Signed)
-   In the setting of end-stage renal disease ?-Potassium 6.3 ?-Nephrology has been notified by ED provider ?-Instructed patient to receive [facility] we will proceed with hemodialysis later today ?Currently asymptomatic denies any chest pain, no change in rhythm or EKG ?

## 2021-07-09 NOTE — ED Triage Notes (Signed)
"  Call came out for hypoglycemia, on our arrival blood sugar was 44 and patient had decreased responsiveness, gave 1 amp D50, came up to 133. She was supposed to have dialysis Saturday and did not go, she was also supposed to have dialysis today and did not go because her foot hurts from neuropathy. On oxygen at home at 4L via " per EMS ?

## 2021-07-09 NOTE — ED Notes (Signed)
Date and time results received: 07/09/21 1146 ? ?Test: potassium ?Critical Value: 6.3 ? ?Name of Provider Notified: Dr. Roderic Palau ? ?Orders Received? Or Actions Taken?: notified ?

## 2021-07-09 NOTE — H&P (Signed)
?History and Physical  ? ?Patient: Courtney Gray                            PCP: [facility]                    ?DOB: [birth year]            DOA: 07/09/2021 ?NWG:[id number]             DOS: 07/09/2021, 12:43 PM ? ?Medicine, [facility] ? ?Patient coming from:   HOME  ?I have personally reviewed patient's medical records, in electronic medical records, including:  ?[facility] link, and care everywhere.  ? ? ?Chief Complaint:  ? ?Chief Complaint  ?Patient presents with  ? Hypoglycemia  ? ? ?History of present illness:  ? ? ? ? ?Courtney Gray is a 51 year old African-American female with significant history of morbid obesity, DM 2, HTN, HLD, chronic respiratory failure on 4 L at home , ESRD hemodialysis on Tuesday -Thursdays - Saturdays... Presenting today as she missed her hemodialysis on Saturday. ?She was supposed to go today, that again as she was not feeling well, was having neuropathic foot pain, ? ?Patient reports she was not feeling well this morning, was hypoglycemic for unknown reason.  Her blood sugar on arrival was 44 1 amp of D50 was given, blood sugar has improved to 133, ? ?She did not take her home medication this morning...  ? ? ?ED; ?Blood pressure (!) 208/114, pulse 71, temperature 97.9 ?F (36.6 ?C), temperature source Oral, resp. rate 20, height 5\' 5"  (1.651 m), weight (!) 167.8 kg, SpO2 100 %. ? ?Labs; ?Hemoglobin 7.7, hematocrit 27.1, sodium 142, potassium 6.3, BUN 108 Creatinine 18.98,   serum glucose level 60, following CBG 229 ? ? ?Patient noted to be in accelerated hypertensive state, and hyperkalemic state ?Nephrologist was consulted for urgent hemodialysis today. ?[name] has been given ? ? ?Note patient had a similar presentation and discharged on 06/11/2021 ? ? ? Patient Denies having: Fever, Chills, Cough, SOB, Chest Pain, Abd pain, N/V/D, headache, dizziness, lightheadedness,  Dysuria, Joint pain, rash, open wounds ? ?ED Course:   ?Blood pressure (!) 208/114, pulse 71,  temperature 97.9 ?F (36.6 ?C), temperature source Oral, resp. rate 20, height 5\' 5"  (1.651 m), weight (!) 167.8 kg, SpO2 100 %. ?Abnormal labs; ? ? ?Review of Systems: As per HPI, otherwise 10 point review of systems were negative.  ? ?---------------------------------------------------------------------------------------------------------------------- ? ?Allergies  ?Allergen Reactions  ? Benzonatate Other (See Comments)  ?  Hallucinations  ? Hydralazine Other (See Comments)  ?  Hallucinations  ? Amoxicillin Nausea And Vomiting  ? Clonidine Other (See Comments)  ?  Altered mental state  ? Morphine Itching  ? Oxycodone Itching  ? ? ?Home MEDs:  ?Prior to Admission medications   ?Medication Sig Start Date End Date Taking? Authorizing Provider  ?acetaminophen (TYLENOL) 500 MG tablet Take 1,000 mg by mouth every 6 (six) hours as needed for moderate pain or headache.   Yes [provider]  ?amLODipine (NORVASC) 10 MG tablet Take 1 tablet (10 mg total) by mouth daily. ?Patient taking differently: Take 10 mg by mouth daily as needed (Blood pressusre). 11/29/20  Yes Roxan Hockey, MD  ?aspirin EC 81 MG tablet Take 1 tablet (81 mg total) by mouth daily with breakfast. 11/28/20  Yes Emokpae, Courage, MD  ?atorvastatin (LIPITOR) 10 MG tablet Take 1 tablet (10 mg total) by mouth daily. ?  Patient taking differently: Take 10 mg by mouth at bedtime. 11/28/20  Yes Roxan Hockey, MD  ?calcium carbonate (TUMS SMOOTHIES) 750 MG chewable tablet Chew 750-1,500 mg by mouth See admin instructions. Take 1500 mg with each meal and 750 mg with each snack   Yes [provider]  ?carvedilol (COREG) 25 MG tablet Take 1 tablet (25 mg total) by mouth 2 (two) times daily. ?Patient taking differently: Take 25 mg by mouth 2 (two) times daily. Do not take on dialysis days Tuesday Thur. And Sat. Take as needed. Take if blood pressure is over 140 11/28/20  Yes Emokpae, Courage, MD  ?diphenhydrAMINE (BENADRYL) 25 MG tablet Take 25 mg  by mouth daily as needed for allergies or sleep.   Yes [provider]  ?docusate sodium (COLACE) 100 MG capsule Take 100 mg by mouth 2 (two) times daily.   Yes [provider]  ?gabapentin (NEURONTIN) 100 MG capsule Take 100-200 mg by mouth See admin instructions. 1 tablet in the morning and 1 tablet around 5pm then take 1 tablet at bedtime. Dialysis days take 2 capsules at 5pm 04/20/19  Yes [provider]  ?glipiZIDE (GLUCOTROL XL) 2.5 MG 24 hr tablet Take 2.5 mg by mouth daily with breakfast.   Yes [provider]  ?HYDROcodone-acetaminophen (NORCO/VICODIN) 5-325 MG tablet Take 1 tablet by mouth 3 (three) times daily as needed. 05/29/21  Yes [provider]  ?LANTUS SOLOSTAR 100 UNIT/ML Solostar Pen Inject 12 Units into the skin at bedtime. 03/18/19  Yes [provider]  ?loperamide (IMODIUM A-D) 2 MG tablet Take 4 mg by mouth as needed for diarrhea or loose stools.   Yes [provider]  ?nitroGLYCERIN (NITROSTAT) 0.4 MG SL tablet Place 1 tablet (0.4 mg total) under the tongue every 5 (five) minutes as needed for chest pain. 05/24/21  Yes [place] Donnamarie Poag, DO  ?pantoprazole (PROTONIX) 40 MG tablet Take 1 tablet (40 mg total) by mouth daily. 11/29/20  Yes Roxan Hockey, MD  ?Phenol (CHLORASEPTIC MT) Use as directed 4 sprays in the mouth or throat 4 (four) times daily as needed (throat pain).   Yes [provider]  ?simethicone (MYLICON) 017 MG chewable tablet Chew 125 mg by mouth 2 (two) times daily as needed for flatulence.   Yes [provider]  ?tiZANidine (ZANAFLEX) 4 MG tablet Take 4 mg by mouth every 8 (eight) hours. Take with gabapentin at same time 04/20/19  Yes [provider]  ?predniSONE (STERAPRED UNI-PAK 21 TAB) 5 MG (21) TBPK tablet Take 5 mg by mouth See admin instructions. Take as directed 6,5,4 ,3,2,1 ?Patient not taking: Reported on 07/09/2021 06/07/21   [provider]  ? ? ?PRN MEDs: sodium chloride,  acetaminophen **OR** acetaminophen, bisacodyl, HYDROmorphone (DILAUDID) injection, ipratropium, labetalol, levalbuterol, ondansetron **OR** ondansetron (ZOFRAN) IV, sodium chloride flush, traZODone ? ?Past Medical History:  ?Diagnosis Date  ? Arthritis   ? Diabetes mellitus without complication ([place])   ? Dialysis complication, subsequent encounter   ? Gout   ? High cholesterol   ? Hypertension   ? Renal disorder   ? ? ?Past Surgical History:  ?Procedure Laterality Date  ? AV FISTULA PLACEMENT    ? x2, unsuccessful.   ? IR FLUORO GUIDE CV LINE LEFT  12/14/2020  ? IR FLUORO GUIDE CV LINE LEFT  02/20/2021  ? IR FLUORO GUIDE CV LINE RIGHT  08/28/2020  ? IR FLUORO GUIDE CV LINE RIGHT  05/13/2021  ? IR FLUORO GUIDE CV LINE RIGHT  05/24/2021  ? IR PERC TUN PERIT CATH WO PORT S&I /IMAG  09/06/2020  ? IR PTA VENOUS EXCEPT DIALYSIS CIRCUIT  02/20/2021  ? IR PTA VENOUS EXCEPT DIALYSIS CIRCUIT  05/13/2021  ? IR REMOVAL TUN CV CATH W/O FL  05/24/2021  ? IR US GUIDE VASC ACCESS LEFT  09/06/2020  ? IR US GUIDE VASC ACCESS RIGHT  05/24/2021  ? IR VENOCAVAGRAM SVC  02/20/2021  ? ? ? reports that she has never smoked. She has never used smokeless tobacco. She reports that she does not drink alcohol and does not use drugs. ? ? ?Family History  ?Problem Relation Age of Onset  ? Diabetes Mother   ? Diabetes Father   ? ? ?Physical Exam:  ? ?Vitals:  ? 07/09/21 1039 07/09/21 1048  ?BP:  (!) 208/114  ?Pulse:  71  ?Resp:  20  ?Temp:  97.9 ?F (36.6 ?C)  ?TempSrc:  Oral  ?SpO2:  100%  ?Weight: (!) 167.8 kg   ?Height: 5\' 5"  (1.651 m)   ? ?Constitutional: NAD, calm, comfortable ?Eyes: PERRL, lids and conjunctivae normal ?ENMT: Mucous membranes are moist. Posterior pharynx clear of any exudate or lesions.Normal dentition.  ?Neck: normal, supple, no masses, no thyromegaly ?Respiratory: clear to auscultation bilaterally, no wheezing, no crackles. Normal respiratory effort. No accessory muscle use.  ?Cardiovascular: Regular rate and rhythm, no murmurs / rubs /  gallops. No extremity edema. 2+ pedal pulses. No carotid bruits.  ?Abdomen: no tenderness, no masses palpated. No hepatosplenomegaly. Bowel sounds positive.  ?Musculoskeletal: no clubbing / cyanosis. No joint d

## 2021-07-10 DIAGNOSIS — N2581 Secondary hyperparathyroidism of renal origin: Secondary | ICD-10-CM | POA: Diagnosis present

## 2021-07-10 DIAGNOSIS — J449 Chronic obstructive pulmonary disease, unspecified: Secondary | ICD-10-CM | POA: Diagnosis present

## 2021-07-10 DIAGNOSIS — E8779 Other fluid overload: Secondary | ICD-10-CM | POA: Diagnosis not present

## 2021-07-10 DIAGNOSIS — E162 Hypoglycemia, unspecified: Secondary | ICD-10-CM | POA: Diagnosis present

## 2021-07-10 DIAGNOSIS — E785 Hyperlipidemia, unspecified: Secondary | ICD-10-CM | POA: Diagnosis not present

## 2021-07-10 DIAGNOSIS — I160 Hypertensive urgency: Secondary | ICD-10-CM | POA: Diagnosis present

## 2021-07-10 DIAGNOSIS — R269 Unspecified abnormalities of gait and mobility: Secondary | ICD-10-CM | POA: Diagnosis not present

## 2021-07-10 DIAGNOSIS — Z91158 Patient's noncompliance with renal dialysis for other reason: Secondary | ICD-10-CM | POA: Diagnosis not present

## 2021-07-10 DIAGNOSIS — M109 Gout, unspecified: Secondary | ICD-10-CM | POA: Diagnosis not present

## 2021-07-10 DIAGNOSIS — Z9981 Dependence on supplemental oxygen: Secondary | ICD-10-CM | POA: Diagnosis not present

## 2021-07-10 DIAGNOSIS — E1122 Type 2 diabetes mellitus with diabetic chronic kidney disease: Secondary | ICD-10-CM | POA: Diagnosis present

## 2021-07-10 DIAGNOSIS — G9341 Metabolic encephalopathy: Secondary | ICD-10-CM | POA: Diagnosis present

## 2021-07-10 DIAGNOSIS — E1169 Type 2 diabetes mellitus with other specified complication: Secondary | ICD-10-CM | POA: Diagnosis present

## 2021-07-10 DIAGNOSIS — R2689 Other abnormalities of gait and mobility: Secondary | ICD-10-CM | POA: Diagnosis not present

## 2021-07-10 DIAGNOSIS — Z992 Dependence on renal dialysis: Secondary | ICD-10-CM | POA: Diagnosis not present

## 2021-07-10 DIAGNOSIS — Z833 Family history of diabetes mellitus: Secondary | ICD-10-CM | POA: Diagnosis not present

## 2021-07-10 DIAGNOSIS — E1165 Type 2 diabetes mellitus with hyperglycemia: Secondary | ICD-10-CM | POA: Diagnosis not present

## 2021-07-10 DIAGNOSIS — E1142 Type 2 diabetes mellitus with diabetic polyneuropathy: Secondary | ICD-10-CM | POA: Diagnosis present

## 2021-07-10 DIAGNOSIS — N250 Renal osteodystrophy: Secondary | ICD-10-CM | POA: Diagnosis not present

## 2021-07-10 DIAGNOSIS — J9621 Acute and chronic respiratory failure with hypoxia: Secondary | ICD-10-CM | POA: Diagnosis not present

## 2021-07-10 DIAGNOSIS — Z6841 Body Mass Index (BMI) 40.0 and over, adult: Secondary | ICD-10-CM | POA: Diagnosis not present

## 2021-07-10 DIAGNOSIS — Z79899 Other long term (current) drug therapy: Secondary | ICD-10-CM | POA: Diagnosis not present

## 2021-07-10 DIAGNOSIS — R531 Weakness: Secondary | ICD-10-CM | POA: Diagnosis not present

## 2021-07-10 DIAGNOSIS — E1340 Other specified diabetes mellitus with diabetic neuropathy, unspecified: Secondary | ICD-10-CM | POA: Diagnosis not present

## 2021-07-10 DIAGNOSIS — E7800 Pure hypercholesterolemia, unspecified: Secondary | ICD-10-CM | POA: Diagnosis present

## 2021-07-10 DIAGNOSIS — D638 Anemia in other chronic diseases classified elsewhere: Secondary | ICD-10-CM | POA: Diagnosis not present

## 2021-07-10 DIAGNOSIS — M6281 Muscle weakness (generalized): Secondary | ICD-10-CM | POA: Diagnosis not present

## 2021-07-10 DIAGNOSIS — I120 Hypertensive chronic kidney disease with stage 5 chronic kidney disease or end stage renal disease: Secondary | ICD-10-CM | POA: Diagnosis not present

## 2021-07-10 DIAGNOSIS — N186 End stage renal disease: Secondary | ICD-10-CM | POA: Diagnosis not present

## 2021-07-10 DIAGNOSIS — E8770 Fluid overload, unspecified: Secondary | ICD-10-CM

## 2021-07-10 DIAGNOSIS — J9611 Chronic respiratory failure with hypoxia: Secondary | ICD-10-CM | POA: Diagnosis present

## 2021-07-10 DIAGNOSIS — E11649 Type 2 diabetes mellitus with hypoglycemia without coma: Secondary | ICD-10-CM | POA: Diagnosis not present

## 2021-07-10 DIAGNOSIS — Z7401 Bed confinement status: Secondary | ICD-10-CM | POA: Diagnosis not present

## 2021-07-10 DIAGNOSIS — E875 Hyperkalemia: Secondary | ICD-10-CM | POA: Diagnosis not present

## 2021-07-10 DIAGNOSIS — I10 Essential (primary) hypertension: Secondary | ICD-10-CM | POA: Diagnosis not present

## 2021-07-10 DIAGNOSIS — R6889 Other general symptoms and signs: Secondary | ICD-10-CM | POA: Diagnosis not present

## 2021-07-10 DIAGNOSIS — K649 Unspecified hemorrhoids: Secondary | ICD-10-CM | POA: Diagnosis present

## 2021-07-10 DIAGNOSIS — D631 Anemia in chronic kidney disease: Secondary | ICD-10-CM | POA: Diagnosis not present

## 2021-07-10 DIAGNOSIS — Z743 Need for continuous supervision: Secondary | ICD-10-CM | POA: Diagnosis not present

## 2021-07-10 LAB — CBC
HCT: 25.5 % — ABNORMAL LOW (ref 36.0–46.0)
Hemoglobin: 7.2 g/dL — ABNORMAL LOW (ref 12.0–15.0)
MCH: 29 pg (ref 26.0–34.0)
MCHC: 28.2 g/dL — ABNORMAL LOW (ref 30.0–36.0)
MCV: 102.8 fL — ABNORMAL HIGH (ref 80.0–100.0)
Platelets: 159 10*3/uL (ref 150–400)
RBC: 2.48 MIL/uL — ABNORMAL LOW (ref 3.87–5.11)
RDW: 17 % — ABNORMAL HIGH (ref 11.5–15.5)
WBC: 10.6 10*3/uL — ABNORMAL HIGH (ref 4.0–10.5)
nRBC: 0 % (ref 0.0–0.2)

## 2021-07-10 LAB — HEPATITIS B SURFACE ANTIGEN: Hepatitis B Surface Ag: NONREACTIVE

## 2021-07-10 LAB — GLUCOSE, CAPILLARY
Glucose-Capillary: 105 mg/dL — ABNORMAL HIGH (ref 70–99)
Glucose-Capillary: 117 mg/dL — ABNORMAL HIGH (ref 70–99)
Glucose-Capillary: 121 mg/dL — ABNORMAL HIGH (ref 70–99)
Glucose-Capillary: 122 mg/dL — ABNORMAL HIGH (ref 70–99)
Glucose-Capillary: 123 mg/dL — ABNORMAL HIGH (ref 70–99)
Glucose-Capillary: 130 mg/dL — ABNORMAL HIGH (ref 70–99)
Glucose-Capillary: 135 mg/dL — ABNORMAL HIGH (ref 70–99)
Glucose-Capillary: 140 mg/dL — ABNORMAL HIGH (ref 70–99)
Glucose-Capillary: 173 mg/dL — ABNORMAL HIGH (ref 70–99)
Glucose-Capillary: 183 mg/dL — ABNORMAL HIGH (ref 70–99)
Glucose-Capillary: 23 mg/dL — CL (ref 70–99)
Glucose-Capillary: 48 mg/dL — ABNORMAL LOW (ref 70–99)
Glucose-Capillary: 51 mg/dL — ABNORMAL LOW (ref 70–99)
Glucose-Capillary: 62 mg/dL — ABNORMAL LOW (ref 70–99)
Glucose-Capillary: 63 mg/dL — ABNORMAL LOW (ref 70–99)
Glucose-Capillary: 70 mg/dL (ref 70–99)
Glucose-Capillary: 70 mg/dL (ref 70–99)
Glucose-Capillary: 71 mg/dL (ref 70–99)
Glucose-Capillary: 78 mg/dL (ref 70–99)
Glucose-Capillary: 80 mg/dL (ref 70–99)
Glucose-Capillary: 88 mg/dL (ref 70–99)
Glucose-Capillary: 89 mg/dL (ref 70–99)
Glucose-Capillary: 90 mg/dL (ref 70–99)
Glucose-Capillary: 91 mg/dL (ref 70–99)
Glucose-Capillary: 99 mg/dL (ref 70–99)

## 2021-07-10 LAB — BASIC METABOLIC PANEL
Anion gap: 16 — ABNORMAL HIGH (ref 5–15)
BUN: 59 mg/dL — ABNORMAL HIGH (ref 6–20)
CO2: 22 mmol/L (ref 22–32)
Calcium: 8.5 mg/dL — ABNORMAL LOW (ref 8.9–10.3)
Chloride: 95 mmol/L — ABNORMAL LOW (ref 98–111)
Creatinine, Ser: 12.08 mg/dL — ABNORMAL HIGH (ref 0.44–1.00)
GFR, Estimated: 3 mL/min — ABNORMAL LOW (ref >60)
Glucose, Bld: 120 mg/dL — ABNORMAL HIGH (ref 70–99)
Potassium: 4.9 mmol/L (ref 3.5–5.1)
Sodium: 133 mmol/L — ABNORMAL LOW (ref 135–145)

## 2021-07-10 LAB — HEMOGLOBIN A1C
Hgb A1c MFr Bld: 5.7 % — ABNORMAL HIGH (ref 4.8–5.6)
Mean Plasma Glucose: 117 mg/dL

## 2021-07-10 LAB — MRSA NEXT GEN BY PCR, NASAL: MRSA by PCR Next Gen: NOT DETECTED

## 2021-07-10 LAB — HEPATITIS B SURFACE ANTIBODY,QUALITATIVE: Hep B S Ab: NONREACTIVE

## 2021-07-10 LAB — SEDIMENTATION RATE: Sed Rate: 58 mm/hr — ABNORMAL HIGH (ref 0–22)

## 2021-07-10 LAB — PROTIME-INR
INR: 1.3 — ABNORMAL HIGH (ref 0.8–1.2)
Prothrombin Time: 15.9 seconds — ABNORMAL HIGH (ref 11.4–15.2)

## 2021-07-10 LAB — C-REACTIVE PROTEIN: CRP: 8.2 mg/dL — ABNORMAL HIGH (ref <1.0)

## 2021-07-10 LAB — URIC ACID: Uric Acid, Serum: 5.9 mg/dL (ref 2.5–7.1)

## 2021-07-10 MED ORDER — GLUCAGON HCL RDNA (DIAGNOSTIC) 1 MG IJ SOLR
1.0000 mg | Freq: Once | INTRAMUSCULAR | Status: AC | PRN
  Administered 2021-07-10: 1 mg via INTRAVENOUS
  Filled 2021-07-10: qty 1

## 2021-07-10 MED ORDER — OCTREOTIDE ACETATE 50 MCG/ML IJ SOLN
50.0000 ug | Freq: Once | INTRAMUSCULAR | Status: AC
  Administered 2021-07-10: 50 ug via SUBCUTANEOUS
  Filled 2021-07-10: qty 1

## 2021-07-10 MED ORDER — PREDNISONE 20 MG PO TABS
40.0000 mg | ORAL_TABLET | Freq: Every day | ORAL | Status: DC
  Administered 2021-07-10 – 2021-07-12 (×3): 40 mg via ORAL
  Filled 2021-07-10 (×3): qty 2

## 2021-07-10 MED ORDER — CHLORHEXIDINE GLUCONATE CLOTH 2 % EX PADS
6.0000 | MEDICATED_PAD | Freq: Every day | CUTANEOUS | Status: DC
  Administered 2021-07-10 – 2021-07-11 (×2): 6 via TOPICAL

## 2021-07-10 MED ORDER — OCTREOTIDE ACETATE 100 MCG/ML IJ SOLN
INTRAMUSCULAR | Status: AC
  Filled 2021-07-10: qty 1

## 2021-07-10 NOTE — [place] FL2 (Signed)
?[place] MEDICAID FL2 LEVEL OF CARE SCREENING TOOL  ?  ? ?IDENTIFICATION  ?Patient Name: ?Courtney Gray Birthdate: 10/23/1970 Sex: female Admission Date (Current Location): ?07/09/2021  ?[place] and [place] Number: [place] ?[id number] [place] and Address:  ?[facility] [address], [place] ?     Provider Number: ?[id number]  ?Attending Physician Name and Address:  ?Manuella Ghazi, Pratik D, DO ? Relative Name and Phone Number:  ?  ?   ?Current Level of Care: ?Hospital Recommended Level of Care: ?[place] Prior Approval Number: ?  ? ?Date Approved/Denied: ?  PASRR Number: ?[id number] A ? ?Discharge Plan: ?SNF ?  ? ?Current Diagnoses: ?Patient Active Problem List  ? Diagnosis Date Noted  ? Hypoglycemia 07/10/2021  ? Volume overload 07/09/2021  ? Acute pulmonary edema (HCC)   ? Acute gout 06/11/2021  ? Hyperkalemia 05/21/2021  ? Hemorrhoids   ? Hyperlipidemia 05/18/2021  ? Rectal bleeding 05/17/2021  ? Morbid obesity with BMI of 60.0-69.9, adult ([place]) 05/17/2021  ? NSTEMI (non-ST elevated myocardial infarction) ([place]) 05/16/2021  ? HTN (hypertension) 09/01/2020  ? ESRD (end stage renal disease) ([place]) 09/01/2020  ? Chronic respiratory failure with hypoxia ([place]) 09/01/2020  ? Hypertensive urgency 11/28/2019  ? Type 2 diabetes mellitus with other specified complication ([place]) 16/96/7893  ? Anemia of chronic disease 11/28/2019  ? ? ?Orientation RESPIRATION BLADDER Height & Weight   ?  ?Self, Situation, Place ? O2 (4L) Incontinent Weight: (!) 381 lb 9.9 oz (173.1 kg) ?Height:  5\' 5"  (165.1 cm)  ?BEHAVIORAL SYMPTOMS/MOOD NEUROLOGICAL BOWEL NUTRITION STATUS  ?    Incontinent Diet (Clear liquid. See d/c summary for updates.)  ?AMBULATORY STATUS COMMUNICATION OF NEEDS Skin   ?Extensive Assist Verbally Other (Comment) (Redness to right foot) ?  ?  ?  ?    ?     ?     ? ? ?Personal Care Assistance Level of Assistance  ?Bathing, Feeding, Dressing Bathing Assistance: Maximum assistance ?Feeding  assistance: Limited assistance ?Dressing Assistance: Maximum assistance ?   ? ?Functional Limitations Info  ?Sight, Hearing, Speech Sight Info: Adequate ?Hearing Info: Adequate ?Speech Info: Adequate  ? ? ?SPECIAL CARE FACTORS FREQUENCY  ?PT (By licensed PT)   ?  ?PT Frequency: 5x weekly ?  ?  ?  ?  ?   ? ? ?Contractures    ? ? ?Additional Factors Info  ?Code Status, Allergies Code Status Info: Full code ?Allergies Info: Benzonatate, Hydralazine, Amoxicillin, Clonidine, Morphine, Oxycodone ?  ?  ?  ?   ? ?Current Medications (07/10/2021):  This is the current hospital active medication list ?Current Facility-Administered Medications  ?Medication Dose Route Frequency Provider Last Rate Last Admin  ? 0.9 %  sodium chloride infusion  100 mL Intravenous PRN Corliss Parish, MD      ? 0.9 %  sodium chloride infusion  100 mL Intravenous PRN Corliss Parish, MD      ? 0.9 %  sodium chloride infusion  250 mL Intravenous PRN Shahmehdi, Seyed A, MD      ? acetaminophen (TYLENOL) tablet 650 mg  650 mg Oral Q6H PRN Shahmehdi, Seyed A, MD      ? Or  ? acetaminophen (TYLENOL) suppository 650 mg  650 mg Rectal Q6H PRN Shahmehdi, Seyed A, MD      ? alteplase (CATHFLO ACTIVASE) injection 2 mg  2 mg Intracatheter Once PRN Corliss Parish, MD      ? amLODipine (NORVASC) tablet 10 mg  10 mg Oral Daily  ShahmehdiValeria Batman, MD      ? aspirin EC tablet 81 mg  81 mg Oral Q breakfast Shahmehdi, Seyed A, MD   81 mg at 07/10/21 0849  ? atorvastatin (LIPITOR) tablet 10 mg  10 mg Oral QHS Shahmehdi, Seyed A, MD      ? bisacodyl (DULCOLAX) EC tablet 5 mg  5 mg Oral Daily PRN Shahmehdi, Seyed A, MD      ? calcium carbonate (TUMS - dosed in mg elemental calcium) chewable tablet 1,500 mg  1,500 mg Oral TID with meals Shahmehdi, Seyed A, MD   1,500 mg at 07/10/21 0849  ? calcium carbonate (TUMS - dosed in mg elemental calcium) chewable tablet 750 mg  750 mg Oral With snacks Shahmehdi, Seyed A, MD      ? carvedilol (COREG) tablet 25 mg   25 mg Oral BID Shahmehdi, Seyed A, MD   25 mg at 07/10/21 1045  ? Chlorhexidine Gluconate Cloth 2 % PADS 6 each  6 each Topical Daily Deatra James, MD   6 each at 07/10/21 0944  ? dextrose 10 % infusion   Intravenous Continuous Heath Lark D, DO 50 mL/hr at 07/10/21 0944 Rate Change at 07/10/21 0944  ? dextrose 50 % solution 25 g  25 g Intravenous Q15 min PRN Zierle-Ghosh, Asia B, DO   25 g at 07/10/21 0330  ? diphenhydrAMINE (BENADRYL) capsule 25 mg  25 mg Oral Daily PRN Shahmehdi, Seyed A, MD      ? docusate sodium (COLACE) capsule 100 mg  100 mg Oral BID Shahmehdi, Seyed A, MD      ? gabapentin (NEURONTIN) capsule 100 mg  100 mg Oral TID Skipper Cliche A, MD   100 mg at 07/10/21 1038  ? gabapentin (NEURONTIN) capsule 100 mg  100 mg Oral Q dialysis Shahmehdi, Seyed A, MD      ? [START ON 07/11/2021] gabapentin (NEURONTIN) capsule 100 mg  100 mg Oral Q T,Th,Sa-HD Shahmehdi, Seyed A, MD      ? heparin injection 1,000 Units  1,000 Units Dialysis PRN Corliss Parish, MD      ? heparin injection 5,000 Units  5,000 Units Subcutaneous Q8H Deatra James, MD   5,000 Units at 07/10/21 1326  ? HYDROcodone-acetaminophen (NORCO/VICODIN) 5-325 MG per tablet 1 tablet  1 tablet Oral TID PRN Deatra James, MD   1 tablet at 07/10/21 1331  ? hydrocortisone (ANUSOL-HC) 2.5 % rectal cream 1 application.  1 application. Rectal Daily PRN Shahmehdi, Seyed A, MD      ? HYDROmorphone (DILAUDID) injection 1 mg  1 mg Intravenous Q2H PRN Shahmehdi, Seyed A, MD   1 mg at 07/10/21 1050  ? insulin aspart (novoLOG) injection 0-6 Units  0-6 Units Subcutaneous TID WC Shahmehdi, Seyed A, MD      ? ipratropium (ATROVENT) nebulizer solution 0.5 mg  0.5 mg Nebulization Q6H PRN Shahmehdi, Seyed A, MD   0.5 mg at 07/09/21 1332  ? labetalol (NORMODYNE) injection 10 mg  10 mg Intravenous Q2H PRN Shahmehdi, Seyed A, MD      ? levalbuterol (XOPENEX) nebulizer solution 0.63 mg  0.63 mg Nebulization Q6H PRN Shahmehdi, Seyed A, MD      ?  lidocaine (PF) (XYLOCAINE) 1 % injection 5 mL  5 mL Intradermal PRN Corliss Parish, MD      ? lidocaine-prilocaine (EMLA) cream 1 application.  1 application. Topical PRN Corliss Parish, MD      ? nitroGLYCERIN (NITROSTAT) SL tablet 0.4  mg  0.4 mg Sublingual Q5 min PRN Shahmehdi, Seyed A, MD      ? ondansetron (ZOFRAN) tablet 4 mg  4 mg Oral Q6H PRN Shahmehdi, Seyed A, MD      ? Or  ? ondansetron (ZOFRAN) injection 4 mg  4 mg Intravenous Q6H PRN Skipper Cliche A, MD   4 mg at 07/10/21 0902  ? pantoprazole (PROTONIX) EC tablet 40 mg  40 mg Oral Daily Shahmehdi, Seyed A, MD      ? pentafluoroprop-tetrafluoroeth (GEBAUERS) aerosol 1 application.  1 application. Topical PRN Corliss Parish, MD      ? predniSONE (DELTASONE) tablet 40 mg  40 mg Oral Q breakfast Heath Lark D, DO   40 mg at 07/10/21 1038  ? sodium chloride flush (NS) 0.9 % injection 3 mL  3 mL Intravenous Q12H Shahmehdi, Seyed A, MD   3 mL at 07/09/21 1333  ? sodium chloride flush (NS) 0.9 % injection 3 mL  3 mL Intravenous Q12H Shahmehdi, Seyed A, MD   3 mL at 07/10/21 0944  ? sodium chloride flush (NS) 0.9 % injection 3 mL  3 mL Intravenous PRN Shahmehdi, Seyed A, MD      ? traZODone (DESYREL) tablet 25 mg  25 mg Oral QHS PRN Deatra James, MD      ? ? ? ?Discharge Medications: ?Please see discharge summary for a list of discharge medications. ? ?Relevant Imaging Results: ? ?Relevant Lab Results: ? ? ?Additional Information ?SSN: [id number]. Dialysis at [facility] 2nd shift. ? ?Salome Arnt, LCSW ? ? ? ? ?

## 2021-07-10 NOTE — Progress Notes (Signed)
Inpatient Diabetes Program Recommendations ? ?AACE/ADA: New Consensus Statement on Inpatient Glycemic Control  ? ?Target Ranges:  Prepandial:   less than 140 mg/dL ?     Peak postprandial:   less than 180 mg/dL (1-2 hours) ?     Critically ill patients:  140 - 180 mg/dL  ? ? Latest Reference Range & Units 07/10/21 02:44 07/10/21 03:05 07/10/21 03:26 07/10/21 03:45 07/10/21 04:10 07/10/21 04:48 07/10/21 06:16 07/10/21 07:55  ?Glucose-Capillary 70 - 99 mg/dL 71 70 62 (L) 140 (H) 122 (H) 121 (H) 135 (H) 117 (H)  ? ? Latest Reference Range & Units 07/10/21 00:04 07/10/21 00:35 07/10/21 00:52 07/10/21 01:13 07/10/21 01:35 07/10/21 02:01 07/10/21 02:23  ?Glucose-Capillary 70 - 99 mg/dL 70 90 80 51 (L) 91 48 (L) 89  ? ? Latest Reference Range & Units 07/09/21 20:24 07/09/21 20:59 07/09/21 22:02 07/09/21 22:36 07/09/21 22:39 07/09/21 22:54 07/09/21 23:21 07/09/21 23:48  ?Glucose-Capillary 70 - 99 mg/dL 48 (L) 80 70 39 (LL) 22 (LL) 73 45 (L) 101 (H)  ? ?Review of Glycemic Control ? ?Diabetes history: DM2 ?Outpatient Diabetes medications: Lantus 12 units QHS ?Current orders for Inpatient glycemic control: Semglee 12 units QHS, Novolog 0-6 units TID with meals ? ?Inpatient Diabetes Program Recommendations:   ? ?Insulin: No insulin given since arrival to hospital and multiple hypoglycemia episodes over past 12 hours. Please discontinue Semglee. ? ?Thanks, ?Barnie Alderman, RN, MSN, CDE ?Diabetes Coordinator ?Inpatient Diabetes Program ?[phone] (Team Pager from 8am to 5pm) ? ? ? ?

## 2021-07-10 NOTE — Evaluation (Addendum)
Physical Therapy Evaluation ?Patient Details ?Name: Courtney Gray ?MRN: [id number] ?DOB: [birth year] ?Today's Date: 07/10/2021 ? ?History of Present Illness ? Courtney Gray is a 51 year old African-American female with significant history of morbid obesity, DM 2, HTN, HLD, chronic respiratory failure on 4 L at home , ESRD hemodialysis on Tuesday -Thursdays - Saturdays... Presenting today as she missed her hemodialysis on Saturday.  She was supposed to go today, that again as she was not feeling well, was having neuropathic foot pain,     Patient reports she was not feeling well this morning, was hypoglycemic for unknown reason.  Her blood sugar on arrival was 44 1 amp of D50 was given, blood sugar has improved to 133,     She did not take her home medication this morning...      -Patient was admitted with uncontrolled hypertension as well as hyperkalemia in the setting of missed hemodialysis.  She was also noted to be hypoglycemic per daughter at bedside.  She has undergone hemodialysis on 4/4 and this was uneventful.  Unfortunately, she has remained quite hypoglycemic overnight requiring D10 infusion. ?  ?Clinical Impression ? Patient demonstrates slow labored movement for sitting up at bedside with poor tolerance for handling of BLE due to increased pain mostly in right ankle/foot.  Patient required [facility] fully raised and required frequent rest breaks mostly due to drowsiness after receiving pain medication.  Patient unable to stand or scoot laterally due to generalized weakness and required bed in head down position and Max assist to reposition when put back to bed.  Patient will benefit from continued skilled physical therapy in hospital and recommended venue below to increase strength, balance, endurance for safe ADLs and gait.  ?   ?   ? ?Recommendations for follow up therapy are one component of a multi-disciplinary discharge planning process, led by the attending physician.  Recommendations may be updated based  on patient status, additional functional criteria and insurance authorization. ? ?Follow Up Recommendations Skilled nursing-short term rehab (<3 hours/day) ? ?  ?Assistance Recommended at Discharge Intermittent Supervision/Assistance  ?Patient can return home with the following ? A lot of help with bathing/dressing/bathroom;Help with stairs or ramp for entrance;Assistance with cooking/housework;A lot of help with walking and/or transfers ? ?  ?Equipment Recommendations None recommended by PT  ?Recommendations for Other Services ?    ?  ?Functional Status Assessment Patient has had a recent decline in their functional status and demonstrates the ability to make significant improvements in function in a reasonable and predictable amount of time.  ? ?  ?Precautions / Restrictions Precautions ?Precautions: Fall ?Restrictions ?Weight Bearing Restrictions: No  ? ?  ? ?Mobility ? Bed Mobility ?Overal bed mobility: Needs Assistance ?Bed Mobility: Sit to Supine, Supine to Sit ?  ?  ?Supine to sit: Min assist, HOB elevated ?Sit to supine: Mod assist, Max assist ?  ?General bed mobility comments: required HOB fully raised for supine to sitting, Mod/max to move BLE during sit to supine ?  ? ?Transfers ?  ?  ?  ?  ?  ?  ?  ?  ?  ?  ?  ? ?Ambulation/Gait ?  ?  ?  ?  ?  ?  ?  ?  ? ?Stairs ?  ?  ?  ?  ?  ? ?Wheelchair Mobility ?  ? ?Modified Rankin (Stroke Patients Only) ?  ? ?  ? ?Balance Overall balance assessment: Needs assistance ?Sitting-balance support: Feet supported, No upper extremity supported ?Sitting  balance-[name] Scale: Fair ?Sitting balance - Comments: fair/good seated at EOB ?  ?  ?  ?  ?  ?  ?  ?  ?  ?  ?  ?  ?  ?  ?  ?   ? ? ? ?Pertinent Vitals/Pain Pain Assessment ?Pain Assessment: Faces ?Faces Pain Scale: Hurts whole lot ?Pain Location: BLE mostly right ankle/foot ?Pain Descriptors / Indicators: Sore, Grimacing, Guarding, Sharp ?Pain Intervention(s): Limited activity within patient's tolerance, Monitored during  session, Repositioned, Premedicated before session  ? ? ?Home Living Family/patient expects to be discharged to:: Private residence ?Living Arrangements: Children ?Available Help at Discharge: Family;Available 24 hours/day ?Type of Home: Apartment ?Home Access: Stairs to enter ?Entrance Stairs-Rails: None ?Entrance Stairs-Number of Steps: 1 ?  ?Home Layout: One level ?Home Equipment: Hand held shower head;Shower seat;Cane - single point;Grab bars - tub/shower;Rollator (4 wheels) ?   ?  ?Prior Function Prior Level of Function : Needs assist ?  ?  ?  ?Physical Assist : ADLs (physical);Mobility (physical) ?Mobility (physical): Bed mobility;Transfers;Gait;Stairs ?  ?Mobility Comments: household Geologist, engineering, recently assisted for transfers and using wheelchair due to severe right foot/ankle pain due to gout, "per patient & daughter ?ADLs Comments: daughter assists with pericare, bathing, dressing ?  ? ? ?Hand Dominance  ? Dominant Hand: Left ? ?  ?Extremity/Trunk Assessment  ? Upper Extremity Assessment ?Upper Extremity Assessment: Defer to OT evaluation ?  ? ?Lower Extremity Assessment ?Lower Extremity Assessment: Generalized weakness;RLE deficits/detail ?RLE Deficits / Details: grossly -3/5 mostly due ankle/foot pain ?RLE: Unable to fully assess due to pain ?RLE Sensation: WNL ?RLE Coordination: WNL ?  ? ?Cervical / Trunk Assessment ?Cervical / Trunk Assessment: Normal  ?Communication  ? Communication: No difficulties  ?Cognition Arousal/Alertness: Awake/alert ?Behavior During Therapy: [facility] for tasks assessed/performed ?Overall Cognitive Status: Within Functional Limits for tasks assessed ?  ?  ?  ?  ?  ?  ?  ?  ?  ?  ?  ?  ?  ?  ?  ?  ?  ?  ?  ? ?  ?General Comments   ? ?  ?Exercises    ? ?Assessment/Plan  ?  ?PT Assessment Patient needs continued PT services  ?PT Problem List Decreased strength;Decreased activity tolerance;Decreased balance;Decreased mobility ? ?   ?  ?PT Treatment Interventions DME  instruction;Gait training;Stair training;Functional mobility training;Therapeutic activities;Therapeutic exercise;Patient/family education;Balance training   ? ?PT Goals (Current goals can be found in the Care Plan section)  ?Acute Rehab PT Goals ?Patient Stated Goal: return home after rehab ?PT Goal Formulation: With patient/family ?Time For Goal Achievement: 07/24/21 ?Potential to Achieve Goals: Good ? ?  ?Frequency Min 3X/week ?  ? ? ?Co-evaluation   ?  ?  ?  ?  ? ? ?  ?AM-PAC PT "6 Clicks" Mobility  ?Outcome Measure Help needed turning from your back to your side while in a flat bed without using bedrails?: A Lot ?Help needed moving from lying on your back to sitting on the side of a flat bed without using bedrails?: A Lot ?Help needed moving to and from a bed to a chair (including a wheelchair)?: Total ?Help needed standing up from a chair using your arms (e.g., wheelchair or bedside chair)?: Total ?Help needed to walk in hospital room?: Total ?Help needed climbing 3-5 steps with a railing? : Total ?6 Click Score: 8 ? ?  ?End of Session Equipment Utilized During Treatment: Oxygen ?Activity Tolerance: Patient tolerated treatment well;Patient limited  by fatigue;Patient limited by pain ?Patient left: in bed;with call bell/phone within reach ?Nurse Communication: Mobility status ?PT Visit Diagnosis: Unsteadiness on feet (R26.81);Other abnormalities of gait and mobility (R26.89);Muscle weakness (generalized) (M62.81) ?  ? ?Time: 9826-4158 ?PT Time Calculation (min) (ACUTE ONLY): 30 min ? ? ?Charges:   PT Evaluation ?$PT Eval Moderate Complexity: 1 Mod ?PT Treatments ?$Therapeutic Activity: 23-37 mins ?  ?   ? ? ?3:13 PM, 07/10/21 ?Lonell Grandchild, MPT ?Physical Therapist with [facility] ?[facility] ?[phone] office ?[phone] mobile phone ? ? ?

## 2021-07-10 NOTE — Progress Notes (Addendum)
1945:  Assumed care of pt at this time.  ?2055:  Dr. Lazarus Salines notified of frequent low blood sugars and needing to give D50.  Received order for PRN order.   ?2305:  Dr. Lazarus Salines notified of blood sugars continuing to drop even with receiving D50 and being on D10 infusion.  She ordered glucagon IM at this time.   ?0015:  Dr. Lazarus Salines on the unit to check on how blood sugars are going and ordered another dose of glucagon IV.  See MAR and lab results for specific times and results.   ?0200:  Received order for octreatide SQ at this time to assist with blood sugar drops. Continuing to check blood sugars as needed.  ?0600:  Blood sugars have remained stable since 0345.  Ranged from 121 to 140.  Made dr aware.  ?

## 2021-07-10 NOTE — Consult Note (Signed)
[facility] ?Renal Consultation Note  ?  ?Indication for Consultation:  Management of ESRD/hemodialysis; anemia, hypertension/volume and secondary hyperparathyroidism ? ?HPI: Courtney Gray is a 51 y.o. female with a PMH significant for DM, HTN, COPD (on home oxygen), noncompliance with HD, morbid obesity, and ESRD on HD TTS at [facility] who presented to [facility] ED with AMS and hypoglycemia.  In the ED, she was noted to be confused/lethargic.  BP 208/114, afebrile, RR 20.  Labs were notable for Hgb 7.7, K 6.3, BUN 108, Cr 18.98, BNP 1448, uric acid 9.2, glucose 39.  She missed HD on Saturday because she wasn't feeling well and has been signing off of HD an hour early due to agitation and right foot pain.  We were consulted to provide urgent HD.  She was admitted under observation initially and given IV D50 and underwent urgent HD yesterday.  After an hour of HD, she became agitated and hypoxic.  CBG was 23 at that time and required NRB.  Given another amp of D50 and transferred to ICU for D10 infusion.  HD was resumed and able to UF 3.7 liters.  She remains lethargic this am and CBG was 78 despite D10 infusion. ? ?Past Medical History:  ?Diagnosis Date  ? Arthritis   ? Diabetes mellitus without complication ([place])   ? Dialysis complication, subsequent encounter   ? Gout   ? High cholesterol   ? Hypertension   ? Renal disorder   ? ?Past Surgical History:  ?Procedure Laterality Date  ? AV FISTULA PLACEMENT    ? x2, unsuccessful.   ? IR FLUORO GUIDE CV LINE LEFT  12/14/2020  ? IR FLUORO GUIDE CV LINE LEFT  02/20/2021  ? IR FLUORO GUIDE CV LINE RIGHT  08/28/2020  ? IR FLUORO GUIDE CV LINE RIGHT  05/13/2021  ? IR FLUORO GUIDE CV LINE RIGHT  05/24/2021  ? IR PERC TUN PERIT CATH WO PORT S&I /IMAG  09/06/2020  ? IR PTA VENOUS EXCEPT DIALYSIS CIRCUIT  02/20/2021  ? IR PTA VENOUS EXCEPT DIALYSIS CIRCUIT  05/13/2021  ? IR REMOVAL TUN CV CATH W/O FL  05/24/2021  ? IR US GUIDE VASC ACCESS LEFT  09/06/2020  ? IR US GUIDE VASC  ACCESS RIGHT  05/24/2021  ? IR VENOCAVAGRAM SVC  02/20/2021  ? ?Family History:   ?Family History  ?Problem Relation Age of Onset  ? Diabetes Mother   ? Diabetes Father   ? ?Social History: ? reports that she has never smoked. She has never used smokeless tobacco. She reports that she does not drink alcohol and does not use drugs. ?Allergies  ?Allergen Reactions  ? Benzonatate Other (See Comments)  ?  Hallucinations  ? Hydralazine Other (See Comments)  ?  Hallucinations  ? Amoxicillin Nausea And Vomiting  ? Clonidine Other (See Comments)  ?  Altered mental state  ? Morphine Itching  ? Oxycodone Itching  ? ?Prior to Admission medications   ?Medication Sig Start Date End Date Taking? Authorizing Provider  ?acetaminophen (TYLENOL) 500 MG tablet Take 1,000 mg by mouth every 6 (six) hours as needed for moderate pain or headache.   Yes [provider]  ?amLODipine (NORVASC) 10 MG tablet Take 1 tablet (10 mg total) by mouth daily. ?Patient taking differently: Take 10 mg by mouth daily as needed (Blood pressusre). 11/29/20  Yes Roxan Hockey, MD  ?aspirin EC 81 MG tablet Take 1 tablet (81 mg total) by mouth daily with breakfast. 11/28/20  Yes Emokpae, Courage,  MD  ?atorvastatin (LIPITOR) 10 MG tablet Take 1 tablet (10 mg total) by mouth daily. ?Patient taking differently: Take 10 mg by mouth at bedtime. 11/28/20  Yes Roxan Hockey, MD  ?calcium carbonate (TUMS SMOOTHIES) 750 MG chewable tablet Chew 750-1,500 mg by mouth See admin instructions. Take 1500 mg with each meal and 750 mg with each snack   Yes [provider]  ?carvedilol (COREG) 25 MG tablet Take 1 tablet (25 mg total) by mouth 2 (two) times daily. ?Patient taking differently: Take 25 mg by mouth 2 (two) times daily. Do not take on dialysis days Tuesday Thur. And Sat. Take as needed. Take if blood pressure is over 140 11/28/20  Yes Emokpae, Courage, MD  ?diphenhydrAMINE (BENADRYL) 25 MG tablet Take 25 mg by mouth daily as needed for allergies  or sleep.   Yes [provider]  ?docusate sodium (COLACE) 100 MG capsule Take 100 mg by mouth 2 (two) times daily.   Yes [provider]  ?gabapentin (NEURONTIN) 100 MG capsule Take 100-200 mg by mouth See admin instructions. 1 tablet in the morning and 1 tablet around 5pm then take 1 tablet at bedtime. Dialysis days take 2 capsules at 5pm 04/20/19  Yes [provider]  ?glipiZIDE (GLUCOTROL XL) 2.5 MG 24 hr tablet Take 2.5 mg by mouth daily with breakfast.   Yes [provider]  ?HYDROcodone-acetaminophen (NORCO/VICODIN) 5-325 MG tablet Take 1 tablet by mouth 3 (three) times daily as needed. 05/29/21  Yes [provider]  ?LANTUS SOLOSTAR 100 UNIT/ML Solostar Pen Inject 12 Units into the skin at bedtime. 03/18/19  Yes [provider]  ?loperamide (IMODIUM A-D) 2 MG tablet Take 4 mg by mouth as needed for diarrhea or loose stools.   Yes [provider]  ?nitroGLYCERIN (NITROSTAT) 0.4 MG SL tablet Place 1 tablet (0.4 mg total) under the tongue every 5 (five) minutes as needed for chest pain. 05/24/21  Yes [place] Donnamarie Poag, DO  ?pantoprazole (PROTONIX) 40 MG tablet Take 1 tablet (40 mg total) by mouth daily. 11/29/20  Yes Roxan Hockey, MD  ?Phenol (CHLORASEPTIC MT) Use as directed 4 sprays in the mouth or throat 4 (four) times daily as needed (throat pain).   Yes [provider]  ?simethicone (MYLICON) 597 MG chewable tablet Chew 125 mg by mouth 2 (two) times daily as needed for flatulence.   Yes [provider]  ?tiZANidine (ZANAFLEX) 4 MG tablet Take 4 mg by mouth every 8 (eight) hours. Take with gabapentin at same time 04/20/19  Yes [provider]  ?predniSONE (STERAPRED UNI-PAK 21 TAB) 5 MG (21) TBPK tablet Take 5 mg by mouth See admin instructions. Take as directed 6,5,4 ,3,2,1 ?Patient not taking: Reported on 07/09/2021 06/07/21   [provider]  ? ?Current Facility-Administered Medications  ?Medication Dose Route  Frequency Provider Last Rate Last Admin  ? 0.9 %  sodium chloride infusion  100 mL Intravenous PRN Corliss Parish, MD      ? 0.9 %  sodium chloride infusion  100 mL Intravenous PRN Corliss Parish, MD      ? 0.9 %  sodium chloride infusion  250 mL Intravenous PRN Shahmehdi, Seyed A, MD      ? acetaminophen (TYLENOL) tablet 650 mg  650 mg Oral Q6H PRN Shahmehdi, Seyed A, MD      ? Or  ? acetaminophen (TYLENOL) suppository 650 mg  650 mg Rectal Q6H PRN Shahmehdi, Valeria Batman, MD      ? alteplase (  CATHFLO ACTIVASE) injection 2 mg  2 mg Intracatheter Once PRN Corliss Parish, MD      ? amLODipine (NORVASC) tablet 10 mg  10 mg Oral Daily Shahmehdi, Seyed A, MD      ? aspirin EC tablet 81 mg  81 mg Oral Q breakfast Skipper Cliche A, MD   81 mg at 07/10/21 0849  ? atorvastatin (LIPITOR) tablet 10 mg  10 mg Oral QHS Shahmehdi, Seyed A, MD      ? bisacodyl (DULCOLAX) EC tablet 5 mg  5 mg Oral Daily PRN Shahmehdi, Seyed A, MD      ? calcium carbonate (TUMS - dosed in mg elemental calcium) chewable tablet 1,500 mg  1,500 mg Oral TID with meals Shahmehdi, Seyed A, MD   1,500 mg at 07/10/21 0849  ? calcium carbonate (TUMS - dosed in mg elemental calcium) chewable tablet 750 mg  750 mg Oral With snacks Shahmehdi, Seyed A, MD      ? carvedilol (COREG) tablet 25 mg  25 mg Oral BID Shahmehdi, Seyed A, MD   25 mg at 07/10/21 1045  ? Chlorhexidine Gluconate Cloth 2 % PADS 6 each  6 each Topical Daily Deatra James, MD   6 each at 07/10/21 0944  ? dextrose 10 % infusion   Intravenous Continuous Heath Lark D, DO 50 mL/hr at 07/10/21 0944 Rate Change at 07/10/21 0944  ? dextrose 50 % solution 25 g  25 g Intravenous Q15 min PRN Zierle-Ghosh, Asia B, DO   25 g at 07/10/21 0330  ? diphenhydrAMINE (BENADRYL) capsule 25 mg  25 mg Oral Daily PRN Shahmehdi, Seyed A, MD      ? docusate sodium (COLACE) capsule 100 mg  100 mg Oral BID Shahmehdi, Seyed A, MD      ? gabapentin (NEURONTIN) capsule 100 mg  100 mg Oral TID  Skipper Cliche A, MD   100 mg at 07/10/21 1038  ? gabapentin (NEURONTIN) capsule 100 mg  100 mg Oral Q dialysis Shahmehdi, Seyed A, MD      ? [START ON 07/11/2021] gabapentin (NEURONTIN) capsule 100 mg  100 mg Oral

## 2021-07-10 NOTE — Progress Notes (Signed)
Pt had an episode of emesis this morning when trying to eat breakfast. MD made aware. Order changed to clear liquid diet and for D10 rate to be changed. MD wants CBG's to be spot checked approx. Every 2 hours. Steroids also started for patient's complains of gout and glucose control.  ? ?Will continue to monitor. ?

## 2021-07-10 NOTE — Progress Notes (Signed)
?PROGRESS NOTE ? ? ? ?Courtney Gray  JJO:[id number] DOB: 1970/10/09 DOA: 07/09/2021 ?PCP: [facility] ? ? ?Brief Narrative:  ? ? ?Courtney Gray is a 51 year old African-American female with significant history of morbid obesity, DM 2, HTN, HLD, chronic respiratory failure on 4 L at home , ESRD hemodialysis on Tuesday -Thursdays - Saturdays... Presenting today as she missed her hemodialysis on Saturday. ?She was supposed to go today, that again as she was not feeling well, was having neuropathic foot pain, ? ?Patient reports she was not feeling well this morning, was hypoglycemic for unknown reason.  Her blood sugar on arrival was 44 1 amp of D50 was given, blood sugar has improved to 133, ? ?She did not take her home medication this morning...  ? ?-Patient was admitted with uncontrolled hypertension as well as hyperkalemia in the setting of missed hemodialysis.  She was also noted to be hypoglycemic per daughter at bedside.  She has undergone hemodialysis on 4/4 and this was uneventful.  Unfortunately, she has remained quite hypoglycemic overnight requiring D10 infusion.  ? ? ?Assessment & Plan: ?  ?Principal Problem: ?  Volume overload ?Active Problems: ?  Hyperkalemia ?  Hypertensive urgency ?  Type 2 diabetes mellitus with other specified complication ([place]) ?  ESRD (end stage renal disease) ([place]) ?  Anemia of chronic disease ?  Morbid obesity with BMI of 60.0-69.9, adult ([place]) ?  Hemorrhoids ?  Hyperlipidemia ? ?Assessment and Plan: ? ? ?Volume overload ?- In setting of end-stage renal disease, missed hemodialysis today ?-Pursuing hemodialysis today ?  ?Hyperkalemia-resolved ?- In the setting of end-stage renal disease ?-Continue to monitor ?  ?ESRD (end stage renal disease) ([place]) ?-Hemodialysis on 4/4, plan to repeat 4/6 ? ?Acute gout flare ?-Start on prednisone taper/5 ?  ?Type 2 diabetes mellitus with hypoglycemia ?-Hold home glipizide and Semglee ?-A1c 5.7% which has dropped from 8.9% 1/23 ?-Query  if this is related to her ALA supplementation at home ?-Continue D10 infusion and monitor closely ?-Clear liquid diet for nausea ?  ?Hypertensive urgency-resolved ?-Denies have having any headache, visual changes, or focal neurological findings ?- Systolic blood pressure> 160 on arrival, diastolic>100 ?-Patient is received home medication including Norvasc, Coreg ?-As needed labetalol ordered ?-Titrating on lowering blood pressure slowly ?  ?Hemorrhoids ?- Nonbleeding continue rectal steroids ?  ?Morbid obesity with BMI of 60.0-69.9, adult ([place]) ?- Counseled regarding weight loss, follow-up PCP, ?Healthy diet and exercise ?Follow-up with PCP regarding weight loss ?  ?  ?Anemia of chronic disease ?- Monitoring H&H closely and transfuse for hemoglobin less than 7 ?-Aranesp to be given with hemodialysis tomorrow ?  ?Hyperlipidemia ?- Continue statins -atorvastatin 10 mg daily ?  ?DVT prophylaxis: Heparin ?Code Status: Full ?Family Communication: Daughter at bedside 4/5 ?Disposition Plan:  ?Status is: Observation ?The patient will require care spanning > 2 midnights and should be moved to inpatient because: Need for IV medications and ongoing close monitoring. ? ? ?Consultants:  ?Nephrology ? ?Procedures:  ?See below ? ?Antimicrobials:  ?None ? ? ?Subjective: ?Patient seen and evaluated today with ongoing confusion and lethargy noted.  She was noted to have significant amounts of hypoglycemia overnight requiring initiation of D10 infusion as well as administration of glucagon and octreotide.  Daughter at bedside states that she is worried this may be related to her ALA supplement that she has been taking for neuropathy. ? ?Objective: ?Vitals:  ? 07/10/21 1000 07/10/21 1030 07/10/21 1100 07/10/21 1130  ?BP: 139/73 (!) 158/97 (!) 161/100 Marland Kitchen)  156/76  ?Pulse: 70  71 74  ?Resp: (!) 21 16 (!) 8 10  ?Temp:   97.8 ?F (36.6 ?C)   ?TempSrc:   Oral   ?SpO2: 97%  95% 100%  ?Weight:      ?Height:      ? ? ?Intake/Output Summary  (Last 24 hours) at 07/10/2021 1203 ?Last data filed at 07/10/2021 0600 ?Gross per 24 hour  ?Intake 1218.25 ml  ?Output 3742 ml  ?Net -2523.75 ml  ? ?Filed Weights  ? 07/09/21 1720 07/09/21 1922 07/10/21 0500  ?Weight: (!) 167.8 kg (!) 176.3 kg (!) 173.1 kg  ? ? ?Examination: ? ?General exam: Appears calm and comfortable, morbidly obese, lethargic ?Respiratory system: Clear to auscultation. Respiratory effort normal.  Currently on nasal cannula ?Cardiovascular system: S1 & S2 heard, RRR.  ?Gastrointestinal system: Abdomen is soft ?Central nervous system: Awake and intermittently somnolent ?Extremities: No edema ?Skin: No significant lesions noted ?Psychiatry: Flat affect. ? ? ? ?Data Reviewed: I have personally reviewed following labs and imaging studies ? ?CBC: ?Recent Labs  ?Lab 07/09/21 ?1105 07/10/21 ?0424  ?WBC 7.7 10.6*  ?NEUTROABS 6.6  --   ?HGB 7.7* 7.2*  ?HCT 27.1* 25.5*  ?MCV 102.3* 102.8*  ?PLT 207 159  ? ?Basic Metabolic Panel: ?Recent Labs  ?Lab 07/09/21 ?1105 07/09/21 ?2139 07/10/21 ?0424  ?NA 142  --  133*  ?K 6.3*  --  4.9  ?CL 102  --  95*  ?CO2 21*  --  22  ?GLUCOSE 60* 75 120*  ?BUN 108*  --  59*  ?CREATININE 18.98*  --  12.08*  ?CALCIUM 9.0  --  8.5*  ? ?GFR: ?Estimated Creatinine Clearance: 9 mL/min (A) (by C-G formula based on SCr of 12.08 mg/dL (H)). ?Liver Function Tests: ?No results for input(s): AST, ALT, ALKPHOS, BILITOT, PROT, ALBUMIN in the last 168 hours. ?No results for input(s): LIPASE, AMYLASE in the last 168 hours. ?No results for input(s): AMMONIA in the last 168 hours. ?Coagulation Profile: ?Recent Labs  ?Lab 07/10/21 ?5409  ?INR 1.3*  ? ?Cardiac Enzymes: ?No results for input(s): CKTOTAL, CKMB, CKMBINDEX, TROPONINI in the last 168 hours. ?BNP (last 3 results) ?No results for input(s): PROBNP in the last 8760 hours. ?HbA1C: ?Recent Labs  ?  07/09/21 ?1429  ?HGBA1C 5.7*  ? ?CBG: ?Recent Labs  ?Lab 07/10/21 ?0448 07/10/21 ?8119 07/10/21 ?1478 07/10/21 ?1033 07/10/21 ?1121  ?GLUCAP 121*  135* 117* 78 99  ? ?Lipid Profile: ?No results for input(s): CHOL, HDL, LDLCALC, TRIG, CHOLHDL, LDLDIRECT in the last 72 hours. ?Thyroid Function Tests: ?No results for input(s): TSH, T4TOTAL, FREET4, T3FREE, THYROIDAB in the last 72 hours. ?Anemia Panel: ?No results for input(s): VITAMINB12, FOLATE, FERRITIN, TIBC, IRON, RETICCTPCT in the last 72 hours. ?Sepsis Labs: ?No results for input(s): PROCALCITON, LATICACIDVEN in the last 168 hours. ? ?Recent Results (from the past 240 hour(s))  ?MRSA Next Gen by PCR, Nasal     Status: None  ? Collection Time: 07/10/21  6:50 AM  ? Specimen: Nasal Mucosa; Nasal Swab  ?Result Value Ref Range Status  ? MRSA by PCR Next Gen NOT DETECTED NOT DETECTED Final  ?  Comment: (NOTE) ?The GeneXpert MRSA Assay (FDA approved for NASAL specimens only), ?is one component of a comprehensive MRSA colonization surveillance ?program. It is not intended to diagnose MRSA infection nor to guide ?or monitor treatment for MRSA infections. ?Test performance is not FDA approved in patients less than 2 years ?old. ?Performed at [facility], [place]  [address] [place], [place] [address] ?  ?  ? ? ? ? ? ?Radiology Studies: ?DG Chest Port 1 View ? ?Result Date: 07/09/2021 ?CLINICAL DATA:  Dyspnea EXAM: PORTABLE CHEST 1 VIEW COMPARISON:  06/10/2021 chest radiograph. FINDINGS: Right internal jugular central venous catheter terminates over the cavoatrial junction. Stable cardiomediastinal silhouette with mild cardiomegaly. No pneumothorax. No pleural effusion. Mild pulmonary edema. Mild hazy bibasilar lung opacities, favor atelectasis. IMPRESSION: 1. Mild congestive heart failure. 2. Mild hazy bibasilar lung opacities, favor atelectasis. Electronically Signed   By: Ilona Sorrel M.D.   On: 07/09/2021 11:29  ? ?DG Foot Complete Right ? ?Result Date: 07/09/2021 ?CLINICAL DATA:  Foot pain.  Neuropathy. EXAM: RIGHT FOOT COMPLETE - 3+ VIEW COMPARISON:  05/17/2021 FINDINGS: Soft tissue swelling. Extensive regional  arterial calcification. No evidence of fracture or apparent osteomyelitis. Small bunion incidentally noted. IMPRESSION: Soft tissue swelling. Vascular calcification. Small bunion. No acute radiographic finding.

## 2021-07-10 NOTE — TOC Initial Note (Signed)
Transition of Care (TOC) - Initial/Assessment Note  ? ? ?Patient Details  ?Name: Courtney Gray ?MRN: [id number] ?Date of Birth: 1970-10-28 ? ?Transition of Care (TOC) CM/SW Contact:    ?Salome Arnt, LCSW ?Phone Number: ?07/10/2021, 2:41 PM ? ?Clinical Narrative:   Pt admitted due to volume overload and hyperkalemia. Assessment completed with daughter, Courtney Gray. Pt's daughter reports pt lives with her. She is on TTS 2nd shift at [facility] in [place]. PT evaluated pt and recommend SNF. LCSW discussed placement process and daughter requests SNF in [place] due to dialysis being there. LCSW will initiate bed search. CMA starting authorization.  ? ? ?Expected Discharge Plan: [place] ?Barriers to Discharge: Continued Medical Work up ? ? ?Patient Goals and CMS Choice ?Patient states their goals for this hospitalization and ongoing recovery are:: short term rehab ?  ?Choice offered to / list presented to : Adult Children ? ?Expected Discharge Plan and Services ?Expected Discharge Plan: [place] ?In-house Referral: Clinical Social Work ?  ?  ?Living arrangements for the past 2 months: Apartment ?                ?  ?  ?  ?  ?  ?  ?  ?  ?  ?  ? ?Prior Living Arrangements/Services ?Living arrangements for the past 2 months: Apartment ?Lives with:: Adult Children ?Patient language and need for interpreter reviewed:: Yes ?Do you feel safe going back to the place where you live?: Yes      ?Need for Family Participation in Patient Care: Yes (Comment) ?Care giver support system in place?: Yes (comment) ?Current home services: DME (wheelchair, home O2) ?Criminal Activity/Legal Involvement Pertinent to Current Situation/Hospitalization: No - Comment as needed ? ?Activities of Daily Living ?Home Assistive Devices/Equipment: None ?ADL Screening (condition at time of admission) ?Patient's cognitive ability adequate to safely complete daily activities?: No ?Is the patient deaf or have difficulty  hearing?: No ?Does the patient have difficulty seeing, even when wearing glasses/contacts?: No ?Does the patient have difficulty concentrating, remembering, or making decisions?: Yes ?Patient able to express need for assistance with ADLs?: Yes ?Does the patient have difficulty dressing or bathing?: No ?Independently performs ADLs?: No ?Does the patient have difficulty walking or climbing stairs?: Yes ?Weakness of Legs: Both ?Weakness of Arms/Hands: None ? ?Permission Sought/Granted ?  ?  ?   ?   ?   ?   ? ?Emotional Assessment ?  ?  ?  ?  ?Alcohol / Substance Use: Not Applicable ?Psych Involvement: No (comment) ? ?Admission diagnosis:  Hyperkalemia [E87.5] ?Volume overload [E87.70] ?Hypoglycemia [E16.2] ?Patient Active Problem List  ? Diagnosis Date Noted  ? Hypoglycemia 07/10/2021  ? Volume overload 07/09/2021  ? Acute pulmonary edema (HCC)   ? Acute gout 06/11/2021  ? Hyperkalemia 05/21/2021  ? Hemorrhoids   ? Hyperlipidemia 05/18/2021  ? Rectal bleeding 05/17/2021  ? Morbid obesity with BMI of 60.0-69.9, adult ([place]) 05/17/2021  ? NSTEMI (non-ST elevated myocardial infarction) ([place]) 05/16/2021  ? HTN (hypertension) 09/01/2020  ? ESRD (end stage renal disease) ([place]) 09/01/2020  ? Chronic respiratory failure with hypoxia ([place]) 09/01/2020  ? Hypertensive urgency 11/28/2019  ? Type 2 diabetes mellitus with other specified complication ([place]) 72/53/6644  ? Anemia of chronic disease 11/28/2019  ? ?PCP:  [facility] ?Pharmacy:   ?[facility] ?[place] ?[place] [address] ?Phone: [phone] Fax: [phone] ? ? ? ? ?Social  Determinants of Health (SDOH) Interventions ?  ? ?Readmission Risk Interventions ? ?  05/23/2021  ?  1:12 PM  ?Readmission Risk Prevention Plan  ?Transportation Screening Complete  ?Medication Review Press photographer) Complete  ?PCP or Specialist appointment within 3-5 days of discharge Complete  ?[place] or Home Care Consult Complete  ?SW  Recovery Care/Counseling Consult Complete  ?Palliative Care Screening Not Applicable  ?[place] Not Applicable  ? ? ? ?

## 2021-07-10 NOTE — Plan of Care (Signed)
?  Problem: Acute Rehab PT Goals(only PT should resolve) ?Goal: Pt Will Go Supine/Side To Sit ?Outcome: Progressing ?Flowsheets (Taken 07/10/2021 1510) ?Pt will go Supine/Side to Sit: ? with min guard assist ? with minimal assist ?Goal: Patient Will Transfer Sit To/From Stand ?Outcome: Progressing ?Flowsheets (Taken 07/10/2021 1510) ?Patient will transfer sit to/from stand: with moderate assist ?Goal: Pt Will Transfer Bed To Chair/Chair To Bed ?Outcome: Progressing ?Flowsheets (Taken 07/10/2021 1510) ?Pt will Transfer Bed to Chair/Chair to Bed: with mod assist ?Goal: Pt Will Ambulate ?Outcome: Progressing ?Flowsheets (Taken 07/10/2021 1510) ?Pt will Ambulate: ? 10 feet ? with moderate assist ? with rolling walker ?  ?3:10 PM, 07/10/21 ?Lonell Grandchild, MPT ?Physical Therapist with [facility] ?[facility] ?[phone] office ?[phone] mobile phone ? ?

## 2021-07-11 DIAGNOSIS — E8770 Fluid overload, unspecified: Secondary | ICD-10-CM | POA: Diagnosis not present

## 2021-07-11 LAB — BLOOD GAS, VENOUS
Acid-base deficit: 4.2 mmol/L — ABNORMAL HIGH (ref 0.0–2.0)
Bicarbonate: 21.6 mmol/L (ref 20.0–28.0)
Drawn by: 6381
FIO2: 32 %
O2 Saturation: 100 %
Patient temperature: 36.7
pCO2, Ven: 41 mmHg — ABNORMAL LOW (ref 44–60)
pH, Ven: 7.32 (ref 7.25–7.43)
pO2, Ven: 100 mmHg — ABNORMAL HIGH (ref 32–45)

## 2021-07-11 LAB — CBC
HCT: 26.8 % — ABNORMAL LOW (ref 36.0–46.0)
Hemoglobin: 7.5 g/dL — ABNORMAL LOW (ref 12.0–15.0)
MCH: 27.6 pg (ref 26.0–34.0)
MCHC: 28 g/dL — ABNORMAL LOW (ref 30.0–36.0)
MCV: 98.5 fL (ref 80.0–100.0)
Platelets: 221 10*3/uL (ref 150–400)
RBC: 2.72 MIL/uL — ABNORMAL LOW (ref 3.87–5.11)
RDW: 16.4 % — ABNORMAL HIGH (ref 11.5–15.5)
WBC: 6.8 10*3/uL (ref 4.0–10.5)
nRBC: 0 % (ref 0.0–0.2)

## 2021-07-11 LAB — GLUCOSE, CAPILLARY
Glucose-Capillary: 155 mg/dL — ABNORMAL HIGH (ref 70–99)
Glucose-Capillary: 141 mg/dL — ABNORMAL HIGH (ref 70–99)
Glucose-Capillary: 128 mg/dL — ABNORMAL HIGH (ref 70–99)
Glucose-Capillary: 153 mg/dL — ABNORMAL HIGH (ref 70–99)
Glucose-Capillary: 179 mg/dL — ABNORMAL HIGH (ref 70–99)

## 2021-07-11 LAB — C-REACTIVE PROTEIN: CRP: 6.5 mg/dL — ABNORMAL HIGH (ref <1.0)

## 2021-07-11 LAB — BASIC METABOLIC PANEL
Anion gap: 19 — ABNORMAL HIGH (ref 5–15)
BUN: 74 mg/dL — ABNORMAL HIGH (ref 6–20)
CO2: 23 mmol/L (ref 22–32)
Calcium: 9.3 mg/dL (ref 8.9–10.3)
Chloride: 88 mmol/L — ABNORMAL LOW (ref 98–111)
Creatinine, Ser: 14.14 mg/dL — ABNORMAL HIGH (ref 0.44–1.00)
GFR, Estimated: 3 mL/min — ABNORMAL LOW (ref >60)
Glucose, Bld: 165 mg/dL — ABNORMAL HIGH (ref 70–99)
Potassium: 6.2 mmol/L — ABNORMAL HIGH (ref 3.5–5.1)
Sodium: 130 mmol/L — ABNORMAL LOW (ref 135–145)

## 2021-07-11 LAB — AMMONIA: Ammonia: 13 umol/L (ref 9–35)

## 2021-07-11 LAB — MAGNESIUM: Magnesium: 2.4 mg/dL (ref 1.7–2.4)

## 2021-07-11 LAB — HEPATITIS B SURFACE ANTIBODY, QUANTITATIVE: Hep B S AB Quant (Post): <3.1 m[IU]/mL — ABNORMAL LOW (ref >9.9)

## 2021-07-11 LAB — SEDIMENTATION RATE: Sed Rate: 50 mm/hr — ABNORMAL HIGH (ref 0–22)

## 2021-07-11 MED ORDER — OXYCODONE HCL 5 MG PO TABS
5.0000 mg | ORAL_TABLET | ORAL | Status: DC | PRN
  Administered 2021-07-11 – 2021-07-12 (×4): 5 mg via ORAL
  Filled 2021-07-11 (×4): qty 1

## 2021-07-11 MED ORDER — HEPARIN SODIUM (PORCINE) 1000 UNIT/ML IJ SOLN
INTRAMUSCULAR | Status: AC
  Administered 2021-07-11: 3500 [IU] via INTRAVENOUS_CENTRAL
  Filled 2021-07-11: qty 7

## 2021-07-11 MED ORDER — HEPARIN SODIUM (PORCINE) 1000 UNIT/ML DIALYSIS: 20.0000 [IU]/kg | INTRAMUSCULAR | Status: DC | PRN

## 2021-07-11 MED ORDER — HYDROMORPHONE HCL 1 MG/ML IJ SOLN: 0.5000 mg | INTRAMUSCULAR | Status: DC | PRN

## 2021-07-11 MED ORDER — DARBEPOETIN ALFA 200 MCG/0.4ML IJ SOSY
200.0000 ug | PREFILLED_SYRINGE | INTRAMUSCULAR | Status: DC
  Filled 2021-07-11: qty 0.4

## 2021-07-11 MED ORDER — DARBEPOETIN ALFA 200 MCG/0.4ML IJ SOSY
PREFILLED_SYRINGE | INTRAMUSCULAR | Status: AC
  Administered 2021-07-11: 200 ug via INTRAVENOUS
  Filled 2021-07-11: qty 0.4

## 2021-07-11 MED ORDER — NYSTATIN 100000 UNIT/GM EX POWD
Freq: Two times a day (BID) | CUTANEOUS | Status: DC
  Filled 2021-07-11: qty 15

## 2021-07-11 NOTE — Progress Notes (Signed)
?PROGRESS NOTE ? ? ? ?Courtney Gray  OEU:[id number] DOB: Nov 08, 1970 DOA: 07/09/2021 ?PCP: [facility] ? ? ?Brief Narrative:  ? ? ?Courtney Gray is a 51 year old African-American female with significant history of morbid obesity, DM 2, HTN, HLD, chronic respiratory failure on 4 L at home , ESRD hemodialysis on Tuesday -Thursdays - Saturdays... Presenting today as she missed her hemodialysis on Saturday. ?She was supposed to go today, that again as she was not feeling well, was having neuropathic foot pain, ? ?Patient reports she was not feeling well this morning, was hypoglycemic for unknown reason.  Her blood sugar on arrival was 44 1 amp of D50 was given, blood sugar has improved to 133, ? ?She did not take her home medication this morning...  ? ?-Patient was admitted with uncontrolled hypertension as well as hyperkalemia in the setting of missed hemodialysis.  She was also noted to be hypoglycemic per daughter at bedside.  She has undergone hemodialysis on 4/4 and this was uneventful.  Unfortunately, she has remained quite hypoglycemic overnight requiring D10 infusion.  She continues to have some periods of ongoing confusion overnight and into this morning.  Blood glucose levels have stabilized.  ? ? ?Assessment & Plan: ?  ?Principal Problem: ?  Volume overload ?Active Problems: ?  Hyperkalemia ?  Hypertensive urgency ?  Type 2 diabetes mellitus with other specified complication ([place]) ?  ESRD (end stage renal disease) ([place]) ?  Anemia of chronic disease ?  Morbid obesity with BMI of 60.0-69.9, adult ([place]) ?  Hemorrhoids ?  Hyperlipidemia ?  Hypoglycemia ? ?Assessment and Plan: ? ? ?Acute encephalopathy ?-Suspect metabolic, ongoing ?-TSH and VBG within normal limits ?-Check ammonia levels ?-PT recommending SNF on discharge ? ?Volume overload ?- In setting of end-stage renal disease, missed hemodialysis today ?-Pursuing hemodialysis today ?  ?Hyperkalemia ?- In the setting of end-stage renal  disease ?-Continue to monitor ?-Plans for hemodialysis today 4/6 ?  ?ESRD (end stage renal disease) ([place]) ?-Hemodialysis on 4/4, plan to repeat 4/6 ?  ?Acute gout flare ?-Start on prednisone taper 4/5 ?-No significant improvement noted as of yet ?  ?Type 2 diabetes mellitus with hypoglycemia ?-Hold home glipizide and Semglee ?-A1c 5.7% which has dropped from 8.9% 1/23 ?-Query if this is related to her ALA supplementation at home ?-Continue D10 infusion and monitor closely ?-Clear liquid diet for nausea ?  ?Hypertensive urgency-resolved ?-Denies have having any headache, visual changes, or focal neurological findings ?- Systolic blood pressure> 154 on arrival, diastolic>100 ?-Patient is received home medication including Norvasc, Coreg ?-As needed labetalol ordered ?-Titrating on lowering blood pressure slowly ?  ?Hemorrhoids ?- Nonbleeding continue rectal steroids ?  ?Morbid obesity with BMI of 60.0-69.9, adult ([place]) ?- Counseled regarding weight loss, follow-up PCP, ?Healthy diet and exercise ?Follow-up with PCP regarding weight loss ?  ?  ?Anemia of chronic disease ?- Monitoring H&H closely and transfuse for hemoglobin less than 7 ?-Aranesp to be given with hemodialysis today ?  ?Hyperlipidemia ?- Continue statins -atorvastatin 10 mg daily ?  ?DVT prophylaxis: Heparin ?Code Status: Full ?Family Communication: Daughter at bedside 4/5 ?Disposition Plan:  ?Status is: Inpatient ?Remains inpatient appropriate because: Ongoing confusion and need for IV medications. ?  ?  ?Consultants:  ?Nephrology ?  ?Procedures:  ?See below ?  ?Antimicrobials:  ?None ?  ? ?Subjective: ?Patient seen and evaluated today with improvement in alertness and confusion, but still continues to have some ongoing motor control issues and difficulty with forgetfulness.  She was less  alert last night and had VBG performed which was unremarkable.  Blood glucose levels stabilizing. ? ?Objective: ?Vitals:  ? 07/11/21 0800 07/11/21 0900 07/11/21 0930  07/11/21 1000  ?BP: (!) 166/69 (!) 174/121  (!) 134/110  ?Pulse: 83  69 67  ?Resp: 20 16 14 15   ?Temp:      ?TempSrc:      ?SpO2: 100%  100% 100%  ?Weight:      ?Height:      ? ? ?Intake/Output Summary (Last 24 hours) at 07/11/2021 1031 ?Last data filed at 07/11/2021 0841 ?Gross per 24 hour  ?Intake 1197.43 ml  ?Output --  ?Net 1197.43 ml  ? ?Filed Weights  ? 07/09/21 1922 07/10/21 0500 07/11/21 0500  ?Weight: (!) 176.3 kg (!) 173.1 kg (!) 174.7 kg  ? ? ?Examination: ? ?General exam: Appears calm and comfortable, morbidly obese ?Respiratory system: Clear to auscultation. Respiratory effort normal.  Nasal cannula. ?Cardiovascular system: S1 & S2 heard, RRR.  ?Gastrointestinal system: Abdomen is soft ?Central nervous system: Alert and awake ?Extremities: No edema ?Skin: No significant lesions noted ?Psychiatry: Flat affect. ? ? ? ?Data Reviewed: I have personally reviewed following labs and imaging studies ? ?CBC: ?Recent Labs  ?Lab 07/09/21 ?1105 07/10/21 ?0424 07/11/21 ?6269  ?WBC 7.7 10.6* 6.8  ?NEUTROABS 6.6  --   --   ?HGB 7.7* 7.2* 7.5*  ?HCT 27.1* 25.5* 26.8*  ?MCV 102.3* 102.8* 98.5  ?PLT 207 159 221  ? ?Basic Metabolic Panel: ?Recent Labs  ?Lab 07/09/21 ?1105 07/09/21 ?2139 07/10/21 ?0424 07/11/21 ?4854  ?NA 142  --  133* 130*  ?K 6.3*  --  4.9 6.2*  ?CL 102  --  95* 88*  ?CO2 21*  --  22 23  ?GLUCOSE 60* 75 120* 165*  ?BUN 108*  --  59* 74*  ?CREATININE 18.98*  --  12.08* 14.14*  ?CALCIUM 9.0  --  8.5* 9.3  ?MG  --   --   --  2.4  ? ?GFR: ?Estimated Creatinine Clearance: 7.7 mL/min (A) (by C-G formula based on SCr of 14.14 mg/dL (H)). ?Liver Function Tests: ?No results for input(s): AST, ALT, ALKPHOS, BILITOT, PROT, ALBUMIN in the last 168 hours. ?No results for input(s): LIPASE, AMYLASE in the last 168 hours. ?No results for input(s): AMMONIA in the last 168 hours. ?Coagulation Profile: ?Recent Labs  ?Lab 07/10/21 ?6270  ?INR 1.3*  ? ?Cardiac Enzymes: ?No results for input(s): CKTOTAL, CKMB, CKMBINDEX,  TROPONINI in the last 168 hours. ?BNP (last 3 results) ?No results for input(s): PROBNP in the last 8760 hours. ?HbA1C: ?Recent Labs  ?  07/09/21 ?1429  ?HGBA1C 5.7*  ? ?CBG: ?Recent Labs  ?Lab 07/10/21 ?1614 07/10/21 ?2023 07/10/21 ?2315 07/11/21 ?0444 07/11/21 ?0734  ?GLUCAP 130* 173* 183* 155* 141*  ? ?Lipid Profile: ?No results for input(s): CHOL, HDL, LDLCALC, TRIG, CHOLHDL, LDLDIRECT in the last 72 hours. ?Thyroid Function Tests: ?No results for input(s): TSH, T4TOTAL, FREET4, T3FREE, THYROIDAB in the last 72 hours. ?Anemia Panel: ?No results for input(s): VITAMINB12, FOLATE, FERRITIN, TIBC, IRON, RETICCTPCT in the last 72 hours. ?Sepsis Labs: ?No results for input(s): PROCALCITON, LATICACIDVEN in the last 168 hours. ? ?Recent Results (from the past 240 hour(s))  ?MRSA Next Gen by PCR, Nasal     Status: None  ? Collection Time: 07/10/21  6:50 AM  ? Specimen: Nasal Mucosa; Nasal Swab  ?Result Value Ref Range Status  ? MRSA by PCR Next Gen NOT DETECTED NOT DETECTED Final  ?  Comment: (  NOTE) ?The GeneXpert MRSA Assay (FDA approved for NASAL specimens only), ?is one component of a comprehensive MRSA colonization surveillance ?program. It is not intended to diagnose MRSA infection nor to guide ?or monitor treatment for MRSA infections. ?Test performance is not FDA approved in patients less than 2 years ?old. ?Performed at [facility], [address] [place], [place] [address] ?  ?  ? ? ? ? ? ?Radiology Studies: ?DG Chest Port 1 View ? ?Result Date: 07/09/2021 ?CLINICAL DATA:  Dyspnea EXAM: PORTABLE CHEST 1 VIEW COMPARISON:  06/10/2021 chest radiograph. FINDINGS: Right internal jugular central venous catheter terminates over the cavoatrial junction. Stable cardiomediastinal silhouette with mild cardiomegaly. No pneumothorax. No pleural effusion. Mild pulmonary edema. Mild hazy bibasilar lung opacities, favor atelectasis. IMPRESSION: 1. Mild congestive heart failure. 2. Mild hazy bibasilar lung opacities, favor  atelectasis. Electronically Signed   By: Ilona Sorrel M.D.   On: 07/09/2021 11:29  ? ?DG Foot Complete Right ? ?Result Date: 07/09/2021 ?CLINICAL DATA:  Foot pain.  Neuropathy. EXAM: RIGHT FOOT COMPLETE - 3+ VIEW COMPARISON:  02/1

## 2021-07-11 NOTE — Progress Notes (Addendum)
2315- Upon reassessment pt is very drowsy and has brief eye opening response to physical stimulus. Pt falls asleep as soon as stimulus is decreased and is snoring and drooling. Pt was able to answer questions earlier in shift. PRN pain medications administered earlier in day (see eMAR) Dr. Josephine Cables notified and orders received for arterial blood gas.  ? ?With continued stimulus and attempt to draw blood gas pt became more responsive and is able to answer questions and follow commands. ? ?0030- pt is alert and responsive to verbal stimuli. Pt's daughter updated via telephone.  ? ?RT unable to draw arterial blood gas. Dr. Josephine Cables notified and order changed for venous draw.  ?

## 2021-07-11 NOTE — Progress Notes (Signed)
Patient ID: Courtney Gray, female   DOB: 10/02/1970, 51 y.o.   MRN: [id number] ?S: More awake and alert today but still lethargic.  Off of D10 drip. ?O:BP (!) 134/110   Pulse 67   Temp 98.6 ?F (37 ?C) (Oral)   Resp 15   Ht 5\' 5"  (1.651 m)   Wt (!) 174.7 kg   SpO2 100%   BMI 64.10 kg/m?  ? ?Intake/Output Summary (Last 24 hours) at 07/11/2021 1010 ?Last data filed at 07/11/2021 0841 ?Gross per 24 hour  ?Intake 1197.43 ml  ?Output --  ?Net 1197.43 ml  ? ?Intake/Output: ?I/O last 3 completed shifts: ?In: 2221.2 [P.O.:240; I.V.:1981.2] ?Out: 3000 [Other:3000] ? Intake/Output this shift: ? Total I/O ?In: 194.5 [P.O.:75; I.V.:119.5] ?Out: -  ?Weight change: 6.895 kg ?Gen:NAD ?CVS:RRR ?Resp: CTA ?Abd: +BS, soft, NT/ND ?Ext: 1+ edema BLE, LUE AVG faint bruit, pulsatile ? ?Recent Labs  ?Lab 07/09/21 ?1105 07/09/21 ?2139 07/10/21 ?0424 07/11/21 ?6629  ?NA 142  --  133* 130*  ?K 6.3*  --  4.9 6.2*  ?CL 102  --  95* 88*  ?CO2 21*  --  22 23  ?GLUCOSE 60* 75 120* 165*  ?BUN 108*  --  59* 74*  ?CREATININE 18.98*  --  12.08* 14.14*  ?CALCIUM 9.0  --  8.5* 9.3  ? ?Liver Function Tests: ?No results for input(s): AST, ALT, ALKPHOS, BILITOT, PROT, ALBUMIN in the last 168 hours. ?No results for input(s): LIPASE, AMYLASE in the last 168 hours. ?No results for input(s): AMMONIA in the last 168 hours. ?CBC: ?Recent Labs  ?Lab 07/09/21 ?1105 07/10/21 ?0424 07/11/21 ?4765  ?WBC 7.7 10.6* 6.8  ?NEUTROABS 6.6  --   --   ?HGB 7.7* 7.2* 7.5*  ?HCT 27.1* 25.5* 26.8*  ?MCV 102.3* 102.8* 98.5  ?PLT 207 159 221  ? ?Cardiac Enzymes: ?No results for input(s): CKTOTAL, CKMB, CKMBINDEX, TROPONINI in the last 168 hours. ?CBG: ?Recent Labs  ?Lab 07/10/21 ?1614 07/10/21 ?2023 07/10/21 ?2315 07/11/21 ?0444 07/11/21 ?0734  ?GLUCAP 130* 173* 183* 155* 141*  ? ? ?Iron Studies: No results for input(s): IRON, TIBC, TRANSFERRIN, FERRITIN in the last 72 hours. ?Studies/Results: ?DG Chest Port 1 View ? ?Result Date: 07/09/2021 ?CLINICAL DATA:  Dyspnea EXAM:  PORTABLE CHEST 1 VIEW COMPARISON:  06/10/2021 chest radiograph. FINDINGS: Right internal jugular central venous catheter terminates over the cavoatrial junction. Stable cardiomediastinal silhouette with mild cardiomegaly. No pneumothorax. No pleural effusion. Mild pulmonary edema. Mild hazy bibasilar lung opacities, favor atelectasis. IMPRESSION: 1. Mild congestive heart failure. 2. Mild hazy bibasilar lung opacities, favor atelectasis. Electronically Signed   By: Ilona Sorrel M.D.   On: 07/09/2021 11:29  ? ?DG Foot Complete Right ? ?Result Date: 07/09/2021 ?CLINICAL DATA:  Foot pain.  Neuropathy. EXAM: RIGHT FOOT COMPLETE - 3+ VIEW COMPARISON:  05/17/2021 FINDINGS: Soft tissue swelling. Extensive regional arterial calcification. No evidence of fracture or apparent osteomyelitis. Small bunion incidentally noted. IMPRESSION: Soft tissue swelling. Vascular calcification. Small bunion. No acute radiographic finding. Electronically Signed   By: Nelson Chimes M.D.   On: 07/09/2021 11:22   ? amLODipine  10 mg Oral Daily  ? aspirin EC  81 mg Oral Q breakfast  ? atorvastatin  10 mg Oral QHS  ? calcium carbonate  1,500 mg Oral TID with meals  ? calcium carbonate  750 mg Oral With snacks  ? carvedilol  25 mg Oral BID  ? Chlorhexidine Gluconate Cloth  6 each Topical Daily  ? darbepoetin (ARANESP) injection - DIALYSIS  200 mcg Intravenous Q Thu-HD  ? docusate sodium  100 mg Oral BID  ? gabapentin  100 mg Oral TID  ? gabapentin  100 mg Oral Q T,Th,Sa-HD  ? heparin  5,000 Units Subcutaneous Q8H  ? insulin aspart  0-6 Units Subcutaneous TID WC  ? nystatin   Topical BID  ? pantoprazole  40 mg Oral Daily  ? predniSONE  40 mg Oral Q breakfast  ? sodium chloride flush  3 mL Intravenous Q12H  ? sodium chloride flush  3 mL Intravenous Q12H  ? ? ?BMET ?   ?Component Value Date/Time  ? NA 130 (L) 07/11/2021 9758  ? K 6.2 (H) 07/11/2021 8325  ? CL 88 (L) 07/11/2021 4982  ? CO2 23 07/11/2021 0627  ? GLUCOSE 165 (H) 07/11/2021 6415  ? BUN 74  (H) 07/11/2021 8309  ? CREATININE 14.14 (H) 07/11/2021 4076  ? CALCIUM 9.3 07/11/2021 0627  ? GFRNONAA 3 (L) 07/11/2021 8088  ? GFRAA 6 (L) 11/29/2019 0120  ? ?CBC ?   ?Component Value Date/Time  ? WBC 6.8 07/11/2021 0627  ? RBC 2.72 (L) 07/11/2021 1103  ? HGB 7.5 (L) 07/11/2021 1594  ? HCT 26.8 (L) 07/11/2021 5859  ? PLT 221 07/11/2021 0627  ? MCV 98.5 07/11/2021 0627  ? MCH 27.6 07/11/2021 0627  ? MCHC 28.0 (L) 07/11/2021 2924  ? RDW 16.4 (H) 07/11/2021 4628  ? LYMPHSABS 0.4 (L) 07/09/2021 1105  ? MONOABS 0.6 07/09/2021 1105  ? EOSABS 0.1 07/09/2021 1105  ? BASOSABS 0.0 07/09/2021 1105  ? ? ?Dialysis Orders:  [name]-  TTS - 4:45  EDW 165kg. ?HD Bath 1K/2.5 calc, Dialyzer rev300, Heparin load 1300- then 800 per hour. Access [facility] (refuses other ) -  400 BFR but refuses that high.  Micera 200 mcg IV every 2 weeks, calcitriol 1 mcg, sensipar 30 q treatment  ?  ?Assessment/Plan: ? Hypoglycemia - persistent and required D10 infusion, currently off.  Advance diet as tolerated.  Hold all diabetic medications.  Started on prednisone taper for gout and also help with glucose levels. ? ESRD -  Will continue with HD on TTS schedule ? Hyperkalemia - improved with HD to 4.9 but is up again to 6.2.  will use 1K bath today. ? Hypertension/volume  - UF as tolerated.  Bp under better control since admission and HD. ? Anemia  - Hgb low, transfuse if falls below 7.  Will dose aranesp with HD tomorrow. ? Metabolic bone disease -  continue with meds when able to take po ? Nutrition - renal diet ? Gout - started prednisone taper ? DM type 2 - stop glipizide as above. ? ?Donetta Potts, MD ?[facility] ? ? ?

## 2021-07-11 NOTE — TOC Progression Note (Addendum)
Transition of Care (TOC) - Progression Note  ? ? ?Patient Details  ?Name: Courtney Gray ?MRN: [id number] ?Date of Birth: 04/09/1970 ? ?Transition of Care (TOC) CM/SW Contact  ?Boneta Lucks, RN ?Phone Number: ?07/11/2021, 12:40 PM ? ?Clinical Narrative:   No bed offers, Sent FL2 out to more facilities for a bed offer. DC plan 2-3 days. TOC to follow  INS AUTH started.  ? ?Addendum : [facility] offered, Daughter accepted.  ? ?Expected Discharge Plan: [place] ?Barriers to Discharge: Continued Medical Work up ? ?Expected Discharge Plan and Services ?Expected Discharge Plan: [place] ?In-house Referral: Clinical Social Work ?  ? Living arrangements for the past 2 months: Apartment ?               ?   ?Readmission Risk Interventions ? ?  05/23/2021  ?  1:12 PM  ?Readmission Risk Prevention Plan  ?Transportation Screening Complete  ?Medication Review Press photographer) Complete  ?PCP or Specialist appointment within 3-5 days of discharge Complete  ?[place] or Home Care Consult Complete  ?SW Recovery Care/Counseling Consult Complete  ?Palliative Care Screening Not Applicable  ?[place] Not Applicable  ? ? ?

## 2021-07-11 NOTE — Procedures (Signed)
? ?  HEMODIALYSIS TREATMENT NOTE: ? ? ?Uneventful 3.5 hour treatment completed using RIJ TDC. Goal met: 4 liters removed. No interruption in UF.  All blood was returned. ? ?[name], RN ?

## 2021-07-12 DIAGNOSIS — R6889 Other general symptoms and signs: Secondary | ICD-10-CM | POA: Diagnosis not present

## 2021-07-12 DIAGNOSIS — M109 Gout, unspecified: Secondary | ICD-10-CM | POA: Diagnosis not present

## 2021-07-12 DIAGNOSIS — J9621 Acute and chronic respiratory failure with hypoxia: Secondary | ICD-10-CM | POA: Diagnosis not present

## 2021-07-12 DIAGNOSIS — K219 Gastro-esophageal reflux disease without esophagitis: Secondary | ICD-10-CM | POA: Diagnosis not present

## 2021-07-12 DIAGNOSIS — D649 Anemia, unspecified: Secondary | ICD-10-CM | POA: Diagnosis not present

## 2021-07-12 DIAGNOSIS — R2689 Other abnormalities of gait and mobility: Secondary | ICD-10-CM | POA: Diagnosis not present

## 2021-07-12 DIAGNOSIS — E8770 Fluid overload, unspecified: Secondary | ICD-10-CM | POA: Diagnosis not present

## 2021-07-12 DIAGNOSIS — Z743 Need for continuous supervision: Secondary | ICD-10-CM | POA: Diagnosis not present

## 2021-07-12 DIAGNOSIS — J9691 Respiratory failure, unspecified with hypoxia: Secondary | ICD-10-CM | POA: Diagnosis not present

## 2021-07-12 DIAGNOSIS — Z992 Dependence on renal dialysis: Secondary | ICD-10-CM | POA: Diagnosis not present

## 2021-07-12 DIAGNOSIS — N250 Renal osteodystrophy: Secondary | ICD-10-CM | POA: Diagnosis not present

## 2021-07-12 DIAGNOSIS — N186 End stage renal disease: Secondary | ICD-10-CM | POA: Diagnosis not present

## 2021-07-12 DIAGNOSIS — M1990 Unspecified osteoarthritis, unspecified site: Secondary | ICD-10-CM | POA: Diagnosis not present

## 2021-07-12 DIAGNOSIS — D638 Anemia in other chronic diseases classified elsewhere: Secondary | ICD-10-CM | POA: Diagnosis not present

## 2021-07-12 DIAGNOSIS — R269 Unspecified abnormalities of gait and mobility: Secondary | ICD-10-CM | POA: Diagnosis not present

## 2021-07-12 DIAGNOSIS — D631 Anemia in chronic kidney disease: Secondary | ICD-10-CM | POA: Diagnosis not present

## 2021-07-12 DIAGNOSIS — E11649 Type 2 diabetes mellitus with hypoglycemia without coma: Secondary | ICD-10-CM | POA: Diagnosis not present

## 2021-07-12 DIAGNOSIS — E1340 Other specified diabetes mellitus with diabetic neuropathy, unspecified: Secondary | ICD-10-CM | POA: Diagnosis not present

## 2021-07-12 DIAGNOSIS — G629 Polyneuropathy, unspecified: Secondary | ICD-10-CM | POA: Diagnosis not present

## 2021-07-12 DIAGNOSIS — R531 Weakness: Secondary | ICD-10-CM | POA: Diagnosis not present

## 2021-07-12 DIAGNOSIS — E8779 Other fluid overload: Secondary | ICD-10-CM | POA: Diagnosis not present

## 2021-07-12 DIAGNOSIS — I10 Essential (primary) hypertension: Secondary | ICD-10-CM | POA: Diagnosis not present

## 2021-07-12 DIAGNOSIS — I120 Hypertensive chronic kidney disease with stage 5 chronic kidney disease or end stage renal disease: Secondary | ICD-10-CM | POA: Diagnosis not present

## 2021-07-12 DIAGNOSIS — E785 Hyperlipidemia, unspecified: Secondary | ICD-10-CM | POA: Diagnosis not present

## 2021-07-12 DIAGNOSIS — E875 Hyperkalemia: Secondary | ICD-10-CM | POA: Diagnosis not present

## 2021-07-12 DIAGNOSIS — M6281 Muscle weakness (generalized): Secondary | ICD-10-CM | POA: Diagnosis not present

## 2021-07-12 DIAGNOSIS — E1165 Type 2 diabetes mellitus with hyperglycemia: Secondary | ICD-10-CM | POA: Diagnosis not present

## 2021-07-12 DIAGNOSIS — E119 Type 2 diabetes mellitus without complications: Secondary | ICD-10-CM | POA: Diagnosis not present

## 2021-07-12 DIAGNOSIS — M79671 Pain in right foot: Secondary | ICD-10-CM | POA: Diagnosis not present

## 2021-07-12 DIAGNOSIS — Z299 Encounter for prophylactic measures, unspecified: Secondary | ICD-10-CM | POA: Diagnosis not present

## 2021-07-12 DIAGNOSIS — Z7401 Bed confinement status: Secondary | ICD-10-CM | POA: Diagnosis not present

## 2021-07-12 DIAGNOSIS — E559 Vitamin D deficiency, unspecified: Secondary | ICD-10-CM | POA: Diagnosis not present

## 2021-07-12 DIAGNOSIS — N2581 Secondary hyperparathyroidism of renal origin: Secondary | ICD-10-CM | POA: Diagnosis not present

## 2021-07-12 LAB — BASIC METABOLIC PANEL
Anion gap: 16 — ABNORMAL HIGH (ref 5–15)
BUN: 60 mg/dL — ABNORMAL HIGH (ref 6–20)
CO2: 24 mmol/L (ref 22–32)
Calcium: 9 mg/dL (ref 8.9–10.3)
Chloride: 89 mmol/L — ABNORMAL LOW (ref 98–111)
Creatinine, Ser: 11.03 mg/dL — ABNORMAL HIGH (ref 0.44–1.00)
GFR, Estimated: 4 mL/min — ABNORMAL LOW (ref >60)
Glucose, Bld: 107 mg/dL — ABNORMAL HIGH (ref 70–99)
Potassium: 4.7 mmol/L (ref 3.5–5.1)
Sodium: 129 mmol/L — ABNORMAL LOW (ref 135–145)

## 2021-07-12 LAB — GLUCOSE, CAPILLARY
Glucose-Capillary: 105 mg/dL — ABNORMAL HIGH (ref 70–99)
Glucose-Capillary: 112 mg/dL — ABNORMAL HIGH (ref 70–99)

## 2021-07-12 LAB — CBC
HCT: 25.3 % — ABNORMAL LOW (ref 36.0–46.0)
Hemoglobin: 7.7 g/dL — ABNORMAL LOW (ref 12.0–15.0)
MCH: 29.1 pg (ref 26.0–34.0)
MCHC: 30.4 g/dL (ref 30.0–36.0)
MCV: 95.5 fL (ref 80.0–100.0)
Platelets: 236 10*3/uL (ref 150–400)
RBC: 2.65 MIL/uL — ABNORMAL LOW (ref 3.87–5.11)
RDW: 15.9 % — ABNORMAL HIGH (ref 11.5–15.5)
WBC: 8.5 10*3/uL (ref 4.0–10.5)
nRBC: 0.2 % (ref 0.0–0.2)

## 2021-07-12 LAB — SEDIMENTATION RATE: Sed Rate: 41 mm/hr — ABNORMAL HIGH (ref 0–22)

## 2021-07-12 LAB — C-REACTIVE PROTEIN: CRP: 3.4 mg/dL — ABNORMAL HIGH (ref <1.0)

## 2021-07-12 MED ORDER — OXYCODONE HCL 5 MG PO TABS: 5.0000 mg | ORAL_TABLET | ORAL | 0 refills | Status: DC | PRN

## 2021-07-12 MED ORDER — PREDNISONE 10 MG PO TABS: ORAL_TABLET | ORAL | 0 refills | Status: AC

## 2021-07-12 NOTE — Care Management Important Message (Signed)
Important Message ? ?Patient Details  ?Name: Courtney Gray ?MRN: [id number] ?Date of Birth: 1970/12/31 ? ? ?Medicare Important Message Given:  Yes ? ?Reviewed Medicare IM with Mirian Mo, daughter, via room phone [phone]).  Aware of Medicare right to appeal discharge.  Declined copy at this time.  ? ? ?Dannette Barbara ?07/12/2021, 2:51 PM ?

## 2021-07-12 NOTE — TOC Transition Note (Signed)
Transition of Care (TOC) - CM/SW Discharge Note ? ? ?Patient Details  ?Name: Courtney Gray ?MRN: [id number] ?Date of Birth: [birth year] ? ?Transition of Care (TOC) CM/SW Contact:  ?Boneta Lucks, RN ?Phone Number: ?07/12/2021, 12:39 PM ? ? ?Clinical Narrative:   Auth received, DC summary send to [facility]. RN to call report. EMS will be scheduled when [facility] is ready. Med necessity completed. TOC left daughter a message with discharge plan.  ? ?Final next level of care: [place] ?Barriers to Discharge: Barriers Resolved ? ? ?Patient Goals and CMS Choice ?Patient states their goals for this hospitalization and ongoing recovery are:: SNF ?CMS Medicare.gov Compare Post Acute Care list provided to:: Patient Represenative (must comment) ?Choice offered to / list presented to : Adult Children ? ?Discharge Placement ?  ?           ?  ?Patient to be transferred to facility by: EMS ?Name of family member notified: Antoinette- left message ?Patient and family notified of of transfer: 07/12/21 ? ?Discharge Plan and Services ?In-house Referral: Clinical Social Work ?  ?         ? ?Readmission Risk Interventions ? ?  07/12/2021  ? 12:38 PM 05/23/2021  ?  1:12 PM  ?Readmission Risk Prevention Plan  ?Transportation Screening Complete Complete  ?Medication Review Press photographer) Complete Complete  ?PCP or Specialist appointment within 3-5 days of discharge Complete Complete  ?[place] or Home Care Consult Complete Complete  ?SW Recovery Care/Counseling Consult Complete Complete  ?Palliative Care Screening Not Applicable Not Applicable  ?Skilled Nursing Facility Complete Not Applicable  ? ? ? ? ? ?

## 2021-07-12 NOTE — Progress Notes (Signed)
Patient ID: Courtney Gray, female   DOB: [birth year], 51 y.o.   MRN: [id number] ?S: Patient awake and alert.  States that she has a funny sensation breathing but saturations are very good.  She is lying flat without any problems.  No chest pain.  Does not think she needs a lot of fluid off tomorrow despite her blood pressure being fairly high ?O:BP (!) 189/82   Pulse 64   Temp 97.6 ?F (36.4 ?C) (Oral)   Resp 20   Ht 5\' 5"  (1.651 m)   Wt (!) 174.7 kg   SpO2 100%   BMI 64.09 kg/m?  ? ?Intake/Output Summary (Last 24 hours) at 07/12/2021 1121 ?Last data filed at 07/11/2021 1431 ?Gross per 24 hour  ?Intake --  ?Output 4000 ml  ?Net -4000 ml  ? ?Intake/Output: ?I/O last 3 completed shifts: ?In: 647.9 [P.O.:75; I.V.:572.9] ?Out: 4000 [Other:4000] ? Intake/Output this shift: ? No intake/output data recorded. ?Weight change: -0.026 kg ?Gen:NAD, lying in bed ?CVS: Normal rate, no audible rub ?Resp: Bilateral chest rise with no increased work of breathing ?Abd: +BS, soft, NT/ND ?Ext: 1+ edema BLE, warm and well perfused, no cyanosis ? ?Recent Labs  ?Lab 07/09/21 ?1105 07/09/21 ?2139 07/10/21 ?0424 07/11/21 ?0272 07/12/21 ?5366  ?NA 142  --  133* 130* 129*  ?K 6.3*  --  4.9 6.2* 4.7  ?CL 102  --  95* 88* 89*  ?CO2 21*  --  22 23 24   ?GLUCOSE 60* 75 120* 165* 107*  ?BUN 108*  --  59* 74* 60*  ?CREATININE 18.98*  --  12.08* 14.14* 11.03*  ?CALCIUM 9.0  --  8.5* 9.3 9.0  ? ?Liver Function Tests: ?No results for input(s): AST, ALT, ALKPHOS, BILITOT, PROT, ALBUMIN in the last 168 hours. ?No results for input(s): LIPASE, AMYLASE in the last 168 hours. ?Recent Labs  ?Lab 07/11/21 ?1048  ?AMMONIA 13  ? ?CBC: ?Recent Labs  ?Lab 07/09/21 ?1105 07/10/21 ?0424 07/11/21 ?4403 07/12/21 ?4742  ?WBC 7.7 10.6* 6.8 8.5  ?NEUTROABS 6.6  --   --   --   ?HGB 7.7* 7.2* 7.5* 7.7*  ?HCT 27.1* 25.5* 26.8* 25.3*  ?MCV 102.3* 102.8* 98.5 95.5  ?PLT 207 159 221 236  ? ?Cardiac Enzymes: ?No results for input(s): CKTOTAL, CKMB, CKMBINDEX, TROPONINI in the  last 168 hours. ?CBG: ?Recent Labs  ?Lab 07/11/21 ?1108 07/11/21 ?1554 07/11/21 ?1953 07/12/21 ?0730 07/12/21 ?1119  ?GLUCAP 128* 153* 179* 105* 112*  ? ? ?Iron Studies: No results for input(s): IRON, TIBC, TRANSFERRIN, FERRITIN in the last 72 hours. ?Studies/Results: ?No results found. ? amLODipine  10 mg Oral Daily  ? aspirin EC  81 mg Oral Q breakfast  ? atorvastatin  10 mg Oral QHS  ? calcium carbonate  1,500 mg Oral TID with meals  ? calcium carbonate  750 mg Oral With snacks  ? carvedilol  25 mg Oral BID  ? Chlorhexidine Gluconate Cloth  6 each Topical Daily  ? darbepoetin (ARANESP) injection - DIALYSIS  200 mcg Intravenous Q Thu-HD  ? docusate sodium  100 mg Oral BID  ? gabapentin  100 mg Oral Q T,Th,Sa-HD  ? heparin  5,000 Units Subcutaneous Q8H  ? insulin aspart  0-6 Units Subcutaneous TID WC  ? nystatin   Topical BID  ? pantoprazole  40 mg Oral Daily  ? predniSONE  40 mg Oral Q breakfast  ? sodium chloride flush  3 mL Intravenous Q12H  ? sodium chloride flush  3 mL Intravenous Q12H  ? ? ?  BMET ?   ?Component Value Date/Time  ? NA 129 (L) 07/12/2021 0836  ? K 4.7 07/12/2021 0836  ? CL 89 (L) 07/12/2021 0836  ? CO2 24 07/12/2021 0836  ? GLUCOSE 107 (H) 07/12/2021 0836  ? BUN 60 (H) 07/12/2021 0836  ? CREATININE 11.03 (H) 07/12/2021 0836  ? CALCIUM 9.0 07/12/2021 0836  ? GFRNONAA 4 (L) 07/12/2021 5329  ? GFRAA 6 (L) 11/29/2019 0120  ? ?CBC ?   ?Component Value Date/Time  ? WBC 8.5 07/12/2021 0836  ? RBC 2.65 (L) 07/12/2021 0836  ? HGB 7.7 (L) 07/12/2021 0836  ? HCT 25.3 (L) 07/12/2021 0836  ? PLT 236 07/12/2021 0836  ? MCV 95.5 07/12/2021 0836  ? MCH 29.1 07/12/2021 0836  ? MCHC 30.4 07/12/2021 0836  ? RDW 15.9 (H) 07/12/2021 0836  ? LYMPHSABS 0.4 (L) 07/09/2021 1105  ? MONOABS 0.6 07/09/2021 1105  ? EOSABS 0.1 07/09/2021 1105  ? BASOSABS 0.0 07/09/2021 1105  ? ? ?Dialysis Orders:  [name]-  TTS - 4:45  EDW 165kg. ?HD Bath 1K/2.5 calc, Dialyzer rev300, Heparin load 1300- then 800 per hour. Access [facility]  (refuses other ) -  400 BFR but refuses that high.  Micera 200 mcg IV every 2 weeks, calcitriol 1 mcg, sensipar 30 q treatment  ?  ?Assessment/Plan: ? Hypoglycemia -has improved no longer requiring IV glucose ? ESRD -  Will continue with HD on TTS schedule ? Hyperkalemia -intermittent issue requiring Lokelma.  Also used a 1K bath on 4/6.  Continue to monitor ? Hypertension/volume  -blood pressure remains high but sometimes not getting her blood pressure medications.  I think the patient continues to have a fair amount of volume that she needs removed.  Patient is fairly resistant.  Remove UF as patient allows.  Continue home blood pressure medicines ? Anemia  - Hgb low, transfuse if falls below 7.  aranesp given on 4/6. ? Metabolic bone disease -  continue with meds when able to take po ? Nutrition - renal diet ? Gout - mgmt per primary ? DM type 2 - stop glipizide as above. ?10. Dispo: she stable to DC and resume outpatient dialysis from nephrology perspective if she is no longer having hypoxia. ? ? ?

## 2021-07-12 NOTE — Discharge Summary (Signed)
Physician Discharge Summary  ?Jacqueleen Pulver EXH:[id number] DOB: 10-18-1970 DOA: 07/09/2021 ? ?PCP: Medicine, [facility] ? ?Admit date: 07/09/2021 ? ?Discharge date: 07/12/2021 ? ?Admitted From:Home ? ?Disposition:  SNF ? ?Recommendations for Outpatient Follow-up:  ?Follow up with PCP in 1-2 weeks ?Continue on prednisone taper as prescribed for acute gout flare ?Continue other medications as noted below, hold home glipizide and Lantus for now and monitor blood glucose levels and resume as needed ?Discussed discontinuation of alpha lipoic acid (ALA) supplement as this may have been contributing to her hypoglycemia ?Narcotic prescription provided in chart for 10 tablets and 0 refills ?Resume hemodialysis 4/8 for TTS schedule ? ?Home Health: None ? ?Equipment/Devices: None ? ?Discharge Condition:Stable ? ?CODE STATUS: Full ? ?Diet recommendation: Heart Healthy/carb modified ? ?Brief/Interim Summary: ?Per HPI: ?Courtney Gray is a 51 year old African-American female with significant history of morbid obesity, DM 2, HTN, HLD, chronic respiratory failure on 4 L at home , ESRD hemodialysis on Tuesday -Thursdays - Saturdays... Presenting today as she missed her hemodialysis on Saturday. ?She was supposed to go today, that again as she was not feeling well, was having neuropathic foot pain, ?  ?Patient reports she was not feeling well this morning, was hypoglycemic for unknown reason.  Her blood sugar on arrival was 44 1 amp of D50 was given, blood sugar has improved to 133. ? ?-Patient was admitted with uncontrolled hypertension as well as hyperkalemia in the setting of missed hemodialysis.  She was also noted to be hypoglycemic per daughter at bedside.  She did become profoundly hypoglycemic during the course of hospitalization requiring D10 infusion temporarily as well as glucagon and octreotide.  Her home glipizide and insulin were discontinued.  It appears that she was taking alpha lipoic acid supplements at home to help  with her neuropathy and this may have contributed to her severe hypoglycemia.  She has undergone hemodialysis during the stay which was otherwise uneventful.  Her mentation has improved and her glucose levels are currently stable as she is tolerating diet.  She does have acute gout flare for which she has been started on prednisone taper and continues to have some ongoing pain that will hopefully improve with time.  She has been assessed by PT with recommendation for SNF/rehab at this time. ? ?Discharge Diagnoses:  ?Principal Problem: ?  Volume overload ?Active Problems: ?  Hyperkalemia ?  Hypertensive urgency ?  Type 2 diabetes mellitus with other specified complication ([place]) ?  ESRD (end stage renal disease) ([place]) ?  Anemia of chronic disease ?  Morbid obesity with BMI of 60.0-69.9, adult ([place]) ?  Hemorrhoids ?  Hyperlipidemia ?  Hypoglycemia ? ?Principal discharge diagnosis: Severe hypoglycemia likely in the setting of alpha lipoic acid use with missed hemodialysis.  Acute gout flare to right foot. ? ?Discharge Instructions ? ?Discharge Instructions   ? ? Diet - low sodium heart healthy   Complete by: As directed ?  ? Increase activity slowly   Complete by: As directed ?  ? No wound care   Complete by: As directed ?  ? ?  ? ?Allergies as of 07/12/2021   ? ?   Reactions  ? Benzonatate Other (See Comments)  ? Hallucinations  ? Hydralazine Other (See Comments)  ? Hallucinations  ? Amoxicillin Nausea And Vomiting  ? Clonidine Other (See Comments)  ? Altered mental state  ? Morphine Itching  ? Oxycodone Itching  ? ?  ? ?  ?Medication List  ?  ? ?STOP taking these  medications   ? ?glipiZIDE 2.5 MG 24 hr tablet ?Commonly known as: GLUCOTROL XL ?  ?HYDROcodone-acetaminophen 5-325 MG tablet ?Commonly known as: NORCO/VICODIN ?  ?Lantus SoloStar 100 UNIT/ML Solostar Pen ?Generic drug: insulin glargine ?  ?predniSONE 5 MG (21) Tbpk tablet ?Commonly known as: STERAPRED UNI-PAK 21 TAB ?Replaced by: predniSONE 10 MG tablet ?   ?tiZANidine 4 MG tablet ?Commonly known as: ZANAFLEX ?  ? ?  ? ?TAKE these medications   ? ?acetaminophen 500 MG tablet ?Commonly known as: TYLENOL ?Take 1,000 mg by mouth every 6 (six) hours as needed for moderate pain or headache. ?  ?amLODipine 10 MG tablet ?Commonly known as: NORVASC ?Take 1 tablet (10 mg total) by mouth daily. ?What changed:  ?when to take this ?reasons to take this ?  ?aspirin EC 81 MG tablet ?Take 1 tablet (81 mg total) by mouth daily with breakfast. ?  ?atorvastatin 10 MG tablet ?Commonly known as: LIPITOR ?Take 1 tablet (10 mg total) by mouth daily. ?What changed: when to take this ?  ?carvedilol 25 MG tablet ?Commonly known as: COREG ?Take 1 tablet (25 mg total) by mouth 2 (two) times daily. ?What changed: additional instructions ?  ?CHLORASEPTIC MT ?Use as directed 4 sprays in the mouth or throat 4 (four) times daily as needed (throat pain). ?  ?diphenhydrAMINE 25 MG tablet ?Commonly known as: BENADRYL ?Take 25 mg by mouth daily as needed for allergies or sleep. ?  ?docusate sodium 100 MG capsule ?Commonly known as: COLACE ?Take 100 mg by mouth 2 (two) times daily. ?  ?gabapentin 100 MG capsule ?Commonly known as: NEURONTIN ?Take 100-200 mg by mouth See admin instructions. 1 tablet in the morning and 1 tablet around 5pm then take 1 tablet at bedtime. Dialysis days take 2 capsules at 5pm ?  ?loperamide 2 MG tablet ?Commonly known as: IMODIUM A-D ?Take 4 mg by mouth as needed for diarrhea or loose stools. ?  ?nitroGLYCERIN 0.4 MG SL tablet ?Commonly known as: NITROSTAT ?Place 1 tablet (0.4 mg total) under the tongue every 5 (five) minutes as needed for chest pain. ?  ?oxyCODONE 5 MG immediate release tablet ?Commonly known as: Oxy IR/ROXICODONE ?Take 1 tablet (5 mg total) by mouth every 4 (four) hours as needed for breakthrough pain. ?  ?pantoprazole 40 MG tablet ?Commonly known as: PROTONIX ?Take 1 tablet (40 mg total) by mouth daily. ?  ?predniSONE 10 MG tablet ?Commonly known as:  DELTASONE ?Take 4 tablets (40 mg total) by mouth daily with breakfast for 2 days, THEN 3 tablets (30 mg total) daily with breakfast for 2 days, THEN 2 tablets (20 mg total) daily with breakfast for 2 days, THEN 1 tablet (10 mg total) daily with breakfast for 2 days. ?Start taking on: July 13, 2021 ?Replaces: predniSONE 5 MG (21) Tbpk tablet ?  ?simethicone 125 MG chewable tablet ?Commonly known as: MYLICON ?Chew 125 mg by mouth 2 (two) times daily as needed for flatulence. ?  ?Tums Smoothies 750 MG chewable tablet ?Generic drug: calcium carbonate ?Chew 750-1,500 mg by mouth See admin instructions. Take 1500 mg with each meal and 750 mg with each snack ?  ? ?  ? ? Contact information for follow-up providers   ? ? Medicine, [facility]. Schedule an appointment as soon as possible for a visit in 1 week(s).   ?Specialty: Internal Medicine ?Contact information: ?[address] ?[place] [address] ?[phone] ? ? ?  ?  ? ?  ?  ? ?  Contact information for after-discharge care   ? ? Destination   ? ? [facility] .   ?Service: Skilled Nursing ?Contact information: ?[place] ?[place] ?[phone] ? ?  ?  ? ?  ?  ? ?  ?  ? ?  ? ?Allergies  ?Allergen Reactions  ? Benzonatate Other (See Comments)  ?  Hallucinations  ? Hydralazine Other (See Comments)  ?  Hallucinations  ? Amoxicillin Nausea And Vomiting  ? Clonidine Other (See Comments)  ?  Altered mental state  ? Morphine Itching  ? Oxycodone Itching  ? ? ?Consultations: ?Nephrology ? ? ?Procedures/Studies: ?DG Chest Port 1 View ? ?Result Date: 07/09/2021 ?CLINICAL DATA:  Dyspnea EXAM: PORTABLE CHEST 1 VIEW COMPARISON:  06/10/2021 chest radiograph. FINDINGS: Right internal jugular central venous catheter terminates over the cavoatrial junction. Stable cardiomediastinal silhouette with mild cardiomegaly. No pneumothorax. No pleural effusion. Mild pulmonary edema. Mild hazy bibasilar lung opacities, favor atelectasis.  IMPRESSION: 1. Mild congestive heart failure. 2. Mild hazy bibasilar lung opacities, favor atelectasis. Electronically Signed   By: Ilona Sorrel M.D.   On: 07/09/2021 11:29  ? ?DG Foot Complete Right ? ?Result D

## 2021-07-13 DIAGNOSIS — E559 Vitamin D deficiency, unspecified: Secondary | ICD-10-CM | POA: Diagnosis not present

## 2021-07-13 DIAGNOSIS — N250 Renal osteodystrophy: Secondary | ICD-10-CM | POA: Diagnosis not present

## 2021-07-13 DIAGNOSIS — Z992 Dependence on renal dialysis: Secondary | ICD-10-CM | POA: Diagnosis not present

## 2021-07-13 DIAGNOSIS — N186 End stage renal disease: Secondary | ICD-10-CM | POA: Diagnosis not present

## 2021-07-13 DIAGNOSIS — N2581 Secondary hyperparathyroidism of renal origin: Secondary | ICD-10-CM | POA: Diagnosis not present

## 2021-07-14 DIAGNOSIS — J9691 Respiratory failure, unspecified with hypoxia: Secondary | ICD-10-CM | POA: Diagnosis not present

## 2021-07-15 DIAGNOSIS — E875 Hyperkalemia: Secondary | ICD-10-CM | POA: Diagnosis not present

## 2021-07-15 DIAGNOSIS — Z992 Dependence on renal dialysis: Secondary | ICD-10-CM | POA: Diagnosis not present

## 2021-07-15 DIAGNOSIS — M109 Gout, unspecified: Secondary | ICD-10-CM | POA: Diagnosis not present

## 2021-07-15 DIAGNOSIS — E785 Hyperlipidemia, unspecified: Secondary | ICD-10-CM | POA: Diagnosis not present

## 2021-07-15 DIAGNOSIS — D649 Anemia, unspecified: Secondary | ICD-10-CM | POA: Diagnosis not present

## 2021-07-15 DIAGNOSIS — N186 End stage renal disease: Secondary | ICD-10-CM | POA: Diagnosis not present

## 2021-07-15 DIAGNOSIS — I10 Essential (primary) hypertension: Secondary | ICD-10-CM | POA: Diagnosis not present

## 2021-07-15 DIAGNOSIS — E119 Type 2 diabetes mellitus without complications: Secondary | ICD-10-CM | POA: Diagnosis not present

## 2021-07-16 DIAGNOSIS — Z992 Dependence on renal dialysis: Secondary | ICD-10-CM | POA: Diagnosis not present

## 2021-07-16 DIAGNOSIS — N2581 Secondary hyperparathyroidism of renal origin: Secondary | ICD-10-CM | POA: Diagnosis not present

## 2021-07-16 DIAGNOSIS — E559 Vitamin D deficiency, unspecified: Secondary | ICD-10-CM | POA: Diagnosis not present

## 2021-07-16 DIAGNOSIS — N250 Renal osteodystrophy: Secondary | ICD-10-CM | POA: Diagnosis not present

## 2021-07-16 DIAGNOSIS — N186 End stage renal disease: Secondary | ICD-10-CM | POA: Diagnosis not present

## 2021-07-18 DIAGNOSIS — N2581 Secondary hyperparathyroidism of renal origin: Secondary | ICD-10-CM | POA: Diagnosis not present

## 2021-07-18 DIAGNOSIS — E559 Vitamin D deficiency, unspecified: Secondary | ICD-10-CM | POA: Diagnosis not present

## 2021-07-18 DIAGNOSIS — N186 End stage renal disease: Secondary | ICD-10-CM | POA: Diagnosis not present

## 2021-07-18 DIAGNOSIS — Z992 Dependence on renal dialysis: Secondary | ICD-10-CM | POA: Diagnosis not present

## 2021-07-18 DIAGNOSIS — N250 Renal osteodystrophy: Secondary | ICD-10-CM | POA: Diagnosis not present

## 2021-07-19 DIAGNOSIS — M109 Gout, unspecified: Secondary | ICD-10-CM | POA: Diagnosis not present

## 2021-07-19 DIAGNOSIS — D649 Anemia, unspecified: Secondary | ICD-10-CM | POA: Diagnosis not present

## 2021-07-19 DIAGNOSIS — I10 Essential (primary) hypertension: Secondary | ICD-10-CM | POA: Diagnosis not present

## 2021-07-19 DIAGNOSIS — E785 Hyperlipidemia, unspecified: Secondary | ICD-10-CM | POA: Diagnosis not present

## 2021-07-19 DIAGNOSIS — Z992 Dependence on renal dialysis: Secondary | ICD-10-CM | POA: Diagnosis not present

## 2021-07-19 DIAGNOSIS — E119 Type 2 diabetes mellitus without complications: Secondary | ICD-10-CM | POA: Diagnosis not present

## 2021-07-19 DIAGNOSIS — N186 End stage renal disease: Secondary | ICD-10-CM | POA: Diagnosis not present

## 2021-07-19 DIAGNOSIS — G629 Polyneuropathy, unspecified: Secondary | ICD-10-CM | POA: Diagnosis not present

## 2021-07-20 DIAGNOSIS — N250 Renal osteodystrophy: Secondary | ICD-10-CM | POA: Diagnosis not present

## 2021-07-20 DIAGNOSIS — Z992 Dependence on renal dialysis: Secondary | ICD-10-CM | POA: Diagnosis not present

## 2021-07-20 DIAGNOSIS — N186 End stage renal disease: Secondary | ICD-10-CM | POA: Diagnosis not present

## 2021-07-20 DIAGNOSIS — N2581 Secondary hyperparathyroidism of renal origin: Secondary | ICD-10-CM | POA: Diagnosis not present

## 2021-07-20 DIAGNOSIS — E559 Vitamin D deficiency, unspecified: Secondary | ICD-10-CM | POA: Diagnosis not present

## 2021-07-22 DIAGNOSIS — E785 Hyperlipidemia, unspecified: Secondary | ICD-10-CM | POA: Diagnosis not present

## 2021-07-22 DIAGNOSIS — I10 Essential (primary) hypertension: Secondary | ICD-10-CM | POA: Diagnosis not present

## 2021-07-22 DIAGNOSIS — J9621 Acute and chronic respiratory failure with hypoxia: Secondary | ICD-10-CM | POA: Diagnosis not present

## 2021-07-22 DIAGNOSIS — E11649 Type 2 diabetes mellitus with hypoglycemia without coma: Secondary | ICD-10-CM | POA: Diagnosis not present

## 2021-07-22 DIAGNOSIS — N186 End stage renal disease: Secondary | ICD-10-CM | POA: Diagnosis not present

## 2021-07-23 DIAGNOSIS — N2581 Secondary hyperparathyroidism of renal origin: Secondary | ICD-10-CM | POA: Diagnosis not present

## 2021-07-23 DIAGNOSIS — Z992 Dependence on renal dialysis: Secondary | ICD-10-CM | POA: Diagnosis not present

## 2021-07-23 DIAGNOSIS — E559 Vitamin D deficiency, unspecified: Secondary | ICD-10-CM | POA: Diagnosis not present

## 2021-07-23 DIAGNOSIS — N186 End stage renal disease: Secondary | ICD-10-CM | POA: Diagnosis not present

## 2021-07-23 DIAGNOSIS — N250 Renal osteodystrophy: Secondary | ICD-10-CM | POA: Diagnosis not present

## 2021-07-24 DIAGNOSIS — E119 Type 2 diabetes mellitus without complications: Secondary | ICD-10-CM | POA: Diagnosis not present

## 2021-07-24 DIAGNOSIS — I10 Essential (primary) hypertension: Secondary | ICD-10-CM | POA: Diagnosis not present

## 2021-07-25 DIAGNOSIS — N186 End stage renal disease: Secondary | ICD-10-CM | POA: Diagnosis not present

## 2021-07-25 DIAGNOSIS — Z992 Dependence on renal dialysis: Secondary | ICD-10-CM | POA: Diagnosis not present

## 2021-07-25 DIAGNOSIS — N2581 Secondary hyperparathyroidism of renal origin: Secondary | ICD-10-CM | POA: Diagnosis not present

## 2021-07-25 DIAGNOSIS — E559 Vitamin D deficiency, unspecified: Secondary | ICD-10-CM | POA: Diagnosis not present

## 2021-07-25 DIAGNOSIS — N250 Renal osteodystrophy: Secondary | ICD-10-CM | POA: Diagnosis not present

## 2021-07-26 DIAGNOSIS — D649 Anemia, unspecified: Secondary | ICD-10-CM | POA: Diagnosis not present

## 2021-07-26 DIAGNOSIS — N186 End stage renal disease: Secondary | ICD-10-CM | POA: Diagnosis not present

## 2021-07-26 DIAGNOSIS — E1165 Type 2 diabetes mellitus with hyperglycemia: Secondary | ICD-10-CM | POA: Diagnosis not present

## 2021-07-26 DIAGNOSIS — M1990 Unspecified osteoarthritis, unspecified site: Secondary | ICD-10-CM | POA: Diagnosis not present

## 2021-07-26 DIAGNOSIS — Z299 Encounter for prophylactic measures, unspecified: Secondary | ICD-10-CM | POA: Diagnosis not present

## 2021-07-26 DIAGNOSIS — E11649 Type 2 diabetes mellitus with hypoglycemia without coma: Secondary | ICD-10-CM | POA: Diagnosis not present

## 2021-07-26 DIAGNOSIS — G629 Polyneuropathy, unspecified: Secondary | ICD-10-CM | POA: Diagnosis not present

## 2021-07-26 DIAGNOSIS — E119 Type 2 diabetes mellitus without complications: Secondary | ICD-10-CM | POA: Diagnosis not present

## 2021-07-26 DIAGNOSIS — M109 Gout, unspecified: Secondary | ICD-10-CM | POA: Diagnosis not present

## 2021-07-26 DIAGNOSIS — K219 Gastro-esophageal reflux disease without esophagitis: Secondary | ICD-10-CM | POA: Diagnosis not present

## 2021-07-26 DIAGNOSIS — I10 Essential (primary) hypertension: Secondary | ICD-10-CM | POA: Diagnosis not present

## 2021-07-26 DIAGNOSIS — Z992 Dependence on renal dialysis: Secondary | ICD-10-CM | POA: Diagnosis not present

## 2021-07-27 DIAGNOSIS — N250 Renal osteodystrophy: Secondary | ICD-10-CM | POA: Diagnosis not present

## 2021-07-27 DIAGNOSIS — Z992 Dependence on renal dialysis: Secondary | ICD-10-CM | POA: Diagnosis not present

## 2021-07-27 DIAGNOSIS — N2581 Secondary hyperparathyroidism of renal origin: Secondary | ICD-10-CM | POA: Diagnosis not present

## 2021-07-27 DIAGNOSIS — E559 Vitamin D deficiency, unspecified: Secondary | ICD-10-CM | POA: Diagnosis not present

## 2021-07-27 DIAGNOSIS — N186 End stage renal disease: Secondary | ICD-10-CM | POA: Diagnosis not present

## 2021-07-29 DIAGNOSIS — M109 Gout, unspecified: Secondary | ICD-10-CM | POA: Diagnosis not present

## 2021-07-29 DIAGNOSIS — E785 Hyperlipidemia, unspecified: Secondary | ICD-10-CM | POA: Diagnosis not present

## 2021-07-29 DIAGNOSIS — I10 Essential (primary) hypertension: Secondary | ICD-10-CM | POA: Diagnosis not present

## 2021-07-29 DIAGNOSIS — Z992 Dependence on renal dialysis: Secondary | ICD-10-CM | POA: Diagnosis not present

## 2021-07-29 DIAGNOSIS — E119 Type 2 diabetes mellitus without complications: Secondary | ICD-10-CM | POA: Diagnosis not present

## 2021-07-29 DIAGNOSIS — N186 End stage renal disease: Secondary | ICD-10-CM | POA: Diagnosis not present

## 2021-07-29 DIAGNOSIS — M1990 Unspecified osteoarthritis, unspecified site: Secondary | ICD-10-CM | POA: Diagnosis not present

## 2021-07-29 DIAGNOSIS — D649 Anemia, unspecified: Secondary | ICD-10-CM | POA: Diagnosis not present

## 2021-07-30 DIAGNOSIS — N250 Renal osteodystrophy: Secondary | ICD-10-CM | POA: Diagnosis not present

## 2021-07-30 DIAGNOSIS — N186 End stage renal disease: Secondary | ICD-10-CM | POA: Diagnosis not present

## 2021-07-30 DIAGNOSIS — E559 Vitamin D deficiency, unspecified: Secondary | ICD-10-CM | POA: Diagnosis not present

## 2021-07-30 DIAGNOSIS — Z7689 Persons encountering health services in other specified circumstances: Secondary | ICD-10-CM | POA: Diagnosis not present

## 2021-07-30 DIAGNOSIS — Z992 Dependence on renal dialysis: Secondary | ICD-10-CM | POA: Diagnosis not present

## 2021-07-30 DIAGNOSIS — N2581 Secondary hyperparathyroidism of renal origin: Secondary | ICD-10-CM | POA: Diagnosis not present

## 2021-07-31 DIAGNOSIS — E1122 Type 2 diabetes mellitus with diabetic chronic kidney disease: Secondary | ICD-10-CM | POA: Diagnosis not present

## 2021-07-31 DIAGNOSIS — E1140 Type 2 diabetes mellitus with diabetic neuropathy, unspecified: Secondary | ICD-10-CM | POA: Diagnosis not present

## 2021-07-31 DIAGNOSIS — M6281 Muscle weakness (generalized): Secondary | ICD-10-CM | POA: Diagnosis not present

## 2021-07-31 DIAGNOSIS — N186 End stage renal disease: Secondary | ICD-10-CM | POA: Diagnosis not present

## 2021-07-31 DIAGNOSIS — D631 Anemia in chronic kidney disease: Secondary | ICD-10-CM | POA: Diagnosis not present

## 2021-07-31 DIAGNOSIS — I120 Hypertensive chronic kidney disease with stage 5 chronic kidney disease or end stage renal disease: Secondary | ICD-10-CM | POA: Diagnosis not present

## 2021-07-31 DIAGNOSIS — M10371 Gout due to renal impairment, right ankle and foot: Secondary | ICD-10-CM | POA: Diagnosis not present

## 2021-08-01 DIAGNOSIS — Z7689 Persons encountering health services in other specified circumstances: Secondary | ICD-10-CM | POA: Diagnosis not present

## 2021-08-01 DIAGNOSIS — Z992 Dependence on renal dialysis: Secondary | ICD-10-CM | POA: Diagnosis not present

## 2021-08-01 DIAGNOSIS — E559 Vitamin D deficiency, unspecified: Secondary | ICD-10-CM | POA: Diagnosis not present

## 2021-08-01 DIAGNOSIS — N250 Renal osteodystrophy: Secondary | ICD-10-CM | POA: Diagnosis not present

## 2021-08-01 DIAGNOSIS — N2581 Secondary hyperparathyroidism of renal origin: Secondary | ICD-10-CM | POA: Diagnosis not present

## 2021-08-01 DIAGNOSIS — N186 End stage renal disease: Secondary | ICD-10-CM | POA: Diagnosis not present

## 2021-08-02 DIAGNOSIS — M10371 Gout due to renal impairment, right ankle and foot: Secondary | ICD-10-CM | POA: Diagnosis not present

## 2021-08-02 DIAGNOSIS — I120 Hypertensive chronic kidney disease with stage 5 chronic kidney disease or end stage renal disease: Secondary | ICD-10-CM | POA: Diagnosis not present

## 2021-08-02 DIAGNOSIS — D631 Anemia in chronic kidney disease: Secondary | ICD-10-CM | POA: Diagnosis not present

## 2021-08-02 DIAGNOSIS — E1122 Type 2 diabetes mellitus with diabetic chronic kidney disease: Secondary | ICD-10-CM | POA: Diagnosis not present

## 2021-08-02 DIAGNOSIS — E1140 Type 2 diabetes mellitus with diabetic neuropathy, unspecified: Secondary | ICD-10-CM | POA: Diagnosis not present

## 2021-08-02 DIAGNOSIS — N186 End stage renal disease: Secondary | ICD-10-CM | POA: Diagnosis not present

## 2021-08-03 ENCOUNTER — Observation Stay (HOSPITAL_COMMUNITY): Payer: 59

## 2021-08-03 ENCOUNTER — Inpatient Hospital Stay (HOSPITAL_COMMUNITY)
Admission: EM | Admit: 2021-08-03 | Discharge: 2021-08-28 | DRG: 239 | Disposition: A | Discharge status: Home / Self Care | Payer: 59 | Attending: Family Medicine | Admitting: Family Medicine

## 2021-08-03 ENCOUNTER — Emergency Department (HOSPITAL_COMMUNITY): Payer: 59

## 2021-08-03 ENCOUNTER — Other Ambulatory Visit: Payer: Self-pay

## 2021-08-03 DIAGNOSIS — M79673 Pain in unspecified foot: Secondary | ICD-10-CM

## 2021-08-03 DIAGNOSIS — I998 Other disorder of circulatory system: Secondary | ICD-10-CM | POA: Diagnosis not present

## 2021-08-03 DIAGNOSIS — E119 Type 2 diabetes mellitus without complications: Secondary | ICD-10-CM | POA: Diagnosis not present

## 2021-08-03 DIAGNOSIS — E1142 Type 2 diabetes mellitus with diabetic polyneuropathy: Secondary | ICD-10-CM | POA: Diagnosis present

## 2021-08-03 DIAGNOSIS — I96 Gangrene, not elsewhere classified: Secondary | ICD-10-CM

## 2021-08-03 DIAGNOSIS — E7800 Pure hypercholesterolemia, unspecified: Secondary | ICD-10-CM | POA: Diagnosis present

## 2021-08-03 DIAGNOSIS — M898X9 Other specified disorders of bone, unspecified site: Secondary | ICD-10-CM | POA: Diagnosis not present

## 2021-08-03 DIAGNOSIS — L98499 Non-pressure chronic ulcer of skin of other sites with unspecified severity: Secondary | ICD-10-CM | POA: Diagnosis present

## 2021-08-03 DIAGNOSIS — R6889 Other general symptoms and signs: Secondary | ICD-10-CM | POA: Diagnosis not present

## 2021-08-03 DIAGNOSIS — E1122 Type 2 diabetes mellitus with diabetic chronic kidney disease: Secondary | ICD-10-CM | POA: Diagnosis not present

## 2021-08-03 DIAGNOSIS — N186 End stage renal disease: Secondary | ICD-10-CM | POA: Diagnosis not present

## 2021-08-03 DIAGNOSIS — J9611 Chronic respiratory failure with hypoxia: Secondary | ICD-10-CM | POA: Diagnosis not present

## 2021-08-03 DIAGNOSIS — I2510 Atherosclerotic heart disease of native coronary artery without angina pectoris: Secondary | ICD-10-CM | POA: Diagnosis present

## 2021-08-03 DIAGNOSIS — M79672 Pain in left foot: Secondary | ICD-10-CM | POA: Diagnosis not present

## 2021-08-03 DIAGNOSIS — G4710 Hypersomnia, unspecified: Secondary | ICD-10-CM | POA: Diagnosis not present

## 2021-08-03 DIAGNOSIS — E11649 Type 2 diabetes mellitus with hypoglycemia without coma: Secondary | ICD-10-CM | POA: Diagnosis not present

## 2021-08-03 DIAGNOSIS — Z9981 Dependence on supplemental oxygen: Secondary | ICD-10-CM | POA: Diagnosis not present

## 2021-08-03 DIAGNOSIS — R52 Pain, unspecified: Secondary | ICD-10-CM | POA: Diagnosis not present

## 2021-08-03 DIAGNOSIS — I252 Old myocardial infarction: Secondary | ICD-10-CM

## 2021-08-03 DIAGNOSIS — Z5919 Other inadequate housing: Secondary | ICD-10-CM | POA: Diagnosis not present

## 2021-08-03 DIAGNOSIS — M109 Gout, unspecified: Secondary | ICD-10-CM | POA: Diagnosis present

## 2021-08-03 DIAGNOSIS — Z992 Dependence on renal dialysis: Secondary | ICD-10-CM

## 2021-08-03 DIAGNOSIS — D631 Anemia in chronic kidney disease: Secondary | ICD-10-CM | POA: Diagnosis present

## 2021-08-03 DIAGNOSIS — I701 Atherosclerosis of renal artery: Secondary | ICD-10-CM | POA: Diagnosis not present

## 2021-08-03 DIAGNOSIS — E1143 Type 2 diabetes mellitus with diabetic autonomic (poly)neuropathy: Secondary | ICD-10-CM | POA: Diagnosis present

## 2021-08-03 DIAGNOSIS — F32A Depression, unspecified: Secondary | ICD-10-CM | POA: Diagnosis present

## 2021-08-03 DIAGNOSIS — Z880 Allergy status to penicillin: Secondary | ICD-10-CM

## 2021-08-03 DIAGNOSIS — E1152 Type 2 diabetes mellitus with diabetic peripheral angiopathy with gangrene: Secondary | ICD-10-CM | POA: Diagnosis not present

## 2021-08-03 DIAGNOSIS — I7090 Unspecified atherosclerosis: Secondary | ICD-10-CM | POA: Diagnosis not present

## 2021-08-03 DIAGNOSIS — M2011 Hallux valgus (acquired), right foot: Secondary | ICD-10-CM | POA: Diagnosis not present

## 2021-08-03 DIAGNOSIS — Z833 Family history of diabetes mellitus: Secondary | ICD-10-CM

## 2021-08-03 DIAGNOSIS — Z885 Allergy status to narcotic agent status: Secondary | ICD-10-CM

## 2021-08-03 DIAGNOSIS — I10 Essential (primary) hypertension: Secondary | ICD-10-CM | POA: Diagnosis not present

## 2021-08-03 DIAGNOSIS — Z7982 Long term (current) use of aspirin: Secondary | ICD-10-CM

## 2021-08-03 DIAGNOSIS — Z743 Need for continuous supervision: Secondary | ICD-10-CM | POA: Diagnosis not present

## 2021-08-03 DIAGNOSIS — M7989 Other specified soft tissue disorders: Secondary | ICD-10-CM | POA: Diagnosis not present

## 2021-08-03 DIAGNOSIS — R262 Difficulty in walking, not elsewhere classified: Secondary | ICD-10-CM

## 2021-08-03 DIAGNOSIS — L0390 Cellulitis, unspecified: Secondary | ICD-10-CM | POA: Diagnosis not present

## 2021-08-03 DIAGNOSIS — I739 Peripheral vascular disease, unspecified: Secondary | ICD-10-CM | POA: Diagnosis not present

## 2021-08-03 DIAGNOSIS — I70261 Atherosclerosis of native arteries of extremities with gangrene, right leg: Secondary | ICD-10-CM | POA: Diagnosis present

## 2021-08-03 DIAGNOSIS — I120 Hypertensive chronic kidney disease with stage 5 chronic kidney disease or end stage renal disease: Secondary | ICD-10-CM | POA: Diagnosis present

## 2021-08-03 DIAGNOSIS — M2530 Other instability, unspecified joint: Secondary | ICD-10-CM | POA: Diagnosis not present

## 2021-08-03 DIAGNOSIS — Z79899 Other long term (current) drug therapy: Secondary | ICD-10-CM

## 2021-08-03 DIAGNOSIS — Z888 Allergy status to other drugs, medicaments and biological substances status: Secondary | ICD-10-CM

## 2021-08-03 DIAGNOSIS — G546 Phantom limb syndrome with pain: Secondary | ICD-10-CM | POA: Diagnosis not present

## 2021-08-03 DIAGNOSIS — M1990 Unspecified osteoarthritis, unspecified site: Secondary | ICD-10-CM | POA: Diagnosis not present

## 2021-08-03 DIAGNOSIS — Z6841 Body Mass Index (BMI) 40.0 and over, adult: Secondary | ICD-10-CM

## 2021-08-03 DIAGNOSIS — K5730 Diverticulosis of large intestine without perforation or abscess without bleeding: Secondary | ICD-10-CM | POA: Diagnosis not present

## 2021-08-03 DIAGNOSIS — M79642 Pain in left hand: Secondary | ICD-10-CM | POA: Diagnosis not present

## 2021-08-03 DIAGNOSIS — K3184 Gastroparesis: Secondary | ICD-10-CM | POA: Diagnosis not present

## 2021-08-03 HISTORY — DX: Other disorder of circulatory system: I99.8

## 2021-08-03 HISTORY — DX: Pain in unspecified foot: M79.673

## 2021-08-03 LAB — CBC WITH DIFFERENTIAL/PLATELET
Abs Immature Granulocytes: 0.03 10*3/uL (ref 0.00–0.07)
Basophils Absolute: 0.1 10*3/uL (ref 0.0–0.1)
Basophils Relative: 1 %
Eosinophils Absolute: 0.3 10*3/uL (ref 0.0–0.5)
Eosinophils Relative: 3 %
HCT: 28 % — ABNORMAL LOW (ref 36.0–46.0)
Hemoglobin: 7.8 g/dL — ABNORMAL LOW (ref 12.0–15.0)
Immature Granulocytes: 0 %
Lymphocytes Relative: 9 %
Lymphs Abs: 0.9 10*3/uL (ref 0.7–4.0)
MCH: 28.7 pg (ref 26.0–34.0)
MCHC: 27.9 g/dL — ABNORMAL LOW (ref 30.0–36.0)
MCV: 102.9 fL — ABNORMAL HIGH (ref 80.0–100.0)
Monocytes Absolute: 1 10*3/uL (ref 0.1–1.0)
Monocytes Relative: 10 %
Neutro Abs: 7.8 10*3/uL — ABNORMAL HIGH (ref 1.7–7.7)
Neutrophils Relative %: 77 %
Platelets: 212 10*3/uL (ref 150–400)
RBC: 2.72 MIL/uL — ABNORMAL LOW (ref 3.87–5.11)
RDW: 18.2 % — ABNORMAL HIGH (ref 11.5–15.5)
WBC: 10 10*3/uL (ref 4.0–10.5)
nRBC: 0 % (ref 0.0–0.2)

## 2021-08-03 LAB — HEPATITIS B SURFACE ANTIBODY,QUALITATIVE: Hep B S Ab: NONREACTIVE

## 2021-08-03 LAB — CBG MONITORING, ED: Glucose-Capillary: 143 mg/dL — ABNORMAL HIGH (ref 70–99)

## 2021-08-03 LAB — COMPREHENSIVE METABOLIC PANEL
ALT: 15 U/L (ref 0–44)
AST: 17 U/L (ref 15–41)
Albumin: 3.1 g/dL — ABNORMAL LOW (ref 3.5–5.0)
Alkaline Phosphatase: 61 U/L (ref 38–126)
Anion gap: 16 — ABNORMAL HIGH (ref 5–15)
BUN: 52 mg/dL — ABNORMAL HIGH (ref 6–20)
CO2: 24 mmol/L (ref 22–32)
Calcium: 7.9 mg/dL — ABNORMAL LOW (ref 8.9–10.3)
Chloride: 99 mmol/L (ref 98–111)
Creatinine, Ser: 11.89 mg/dL — ABNORMAL HIGH (ref 0.44–1.00)
GFR, Estimated: 3 mL/min — ABNORMAL LOW (ref >60)
Glucose, Bld: 160 mg/dL — ABNORMAL HIGH (ref 70–99)
Potassium: 4 mmol/L (ref 3.5–5.1)
Sodium: 139 mmol/L (ref 135–145)
Total Bilirubin: 0.7 mg/dL (ref 0.3–1.2)
Total Protein: 7.2 g/dL (ref 6.5–8.1)

## 2021-08-03 LAB — CK: Total CK: 131 U/L (ref 38–234)

## 2021-08-03 LAB — HEPATITIS B SURFACE ANTIGEN: Hepatitis B Surface Ag: NONREACTIVE

## 2021-08-03 MED ORDER — SODIUM CHLORIDE 0.9% FLUSH
3.0000 mL | Freq: Two times a day (BID) | INTRAVENOUS | Status: DC
  Administered 2021-08-03 – 2021-08-08 (×6): 3 mL via INTRAVENOUS

## 2021-08-03 MED ORDER — SODIUM CHLORIDE 0.9 % IV SOLN: 250.0000 mL | INTRAVENOUS | Status: DC | PRN

## 2021-08-03 MED ORDER — CHLORHEXIDINE GLUCONATE CLOTH 2 % EX PADS: 6.0000 | MEDICATED_PAD | Freq: Every day | CUTANEOUS | Status: DC

## 2021-08-03 MED ORDER — PANTOPRAZOLE SODIUM 40 MG PO TBEC
40.0000 mg | DELAYED_RELEASE_TABLET | Freq: Every day | ORAL | Status: DC
  Administered 2021-08-04 – 2021-08-28 (×21): 40 mg via ORAL
  Filled 2021-08-03 (×23): qty 1

## 2021-08-03 MED ORDER — LOPERAMIDE HCL 2 MG PO TABS: 4.0000 mg | ORAL_TABLET | ORAL | Status: DC | PRN

## 2021-08-03 MED ORDER — FENTANYL CITRATE PF 50 MCG/ML IJ SOSY
50.0000 ug | PREFILLED_SYRINGE | Freq: Once | INTRAMUSCULAR | Status: AC
  Administered 2021-08-03: 50 ug via INTRAVENOUS
  Filled 2021-08-03: qty 1

## 2021-08-03 MED ORDER — PENTAFLUOROPROP-TETRAFLUOROETH EX AERO: 1.0000 "application " | INHALATION_SPRAY | CUTANEOUS | Status: DC | PRN

## 2021-08-03 MED ORDER — CALCIUM CARBONATE ANTACID 500 MG PO CHEW
1.5000 | CHEWABLE_TABLET | ORAL | Status: DC
  Administered 2021-08-03 – 2021-08-27 (×9): 300 mg via ORAL
  Filled 2021-08-03 (×7): qty 2

## 2021-08-03 MED ORDER — SODIUM CHLORIDE 0.9% FLUSH: 3.0000 mL | INTRAVENOUS | Status: DC | PRN

## 2021-08-03 MED ORDER — MORPHINE SULFATE (PF) 4 MG/ML IV SOLN
4.0000 mg | Freq: Once | INTRAVENOUS | Status: AC
  Administered 2021-08-03: 4 mg via INTRAVENOUS
  Filled 2021-08-03: qty 1

## 2021-08-03 MED ORDER — ALTEPLASE 2 MG IJ SOLR
2.0000 mg | Freq: Once | INTRAMUSCULAR | Status: DC | PRN
  Filled 2021-08-03: qty 2

## 2021-08-03 MED ORDER — DOCUSATE SODIUM 100 MG PO CAPS
100.0000 mg | ORAL_CAPSULE | Freq: Two times a day (BID) | ORAL | Status: DC
  Administered 2021-08-03 – 2021-08-28 (×43): 100 mg via ORAL
  Filled 2021-08-03 (×45): qty 1

## 2021-08-03 MED ORDER — NITROGLYCERIN 0.4 MG SL SUBL: 0.4000 mg | SUBLINGUAL_TABLET | SUBLINGUAL | Status: DC | PRN

## 2021-08-03 MED ORDER — DIPHENHYDRAMINE HCL 50 MG/ML IJ SOLN
25.0000 mg | Freq: Once | INTRAMUSCULAR | Status: AC
  Administered 2021-08-04: 25 mg via INTRAVENOUS
  Filled 2021-08-03: qty 1

## 2021-08-03 MED ORDER — HEPARIN SODIUM (PORCINE) 1000 UNIT/ML DIALYSIS
1000.0000 [IU] | INTRAMUSCULAR | Status: DC | PRN
  Filled 2021-08-03: qty 1

## 2021-08-03 MED ORDER — LIDOCAINE-PRILOCAINE 2.5-2.5 % EX CREA: 1.0000 "application " | TOPICAL_CREAM | CUTANEOUS | Status: DC | PRN

## 2021-08-03 MED ORDER — DIPHENHYDRAMINE HCL 25 MG PO CAPS
25.0000 mg | ORAL_CAPSULE | Freq: Every day | ORAL | Status: DC | PRN
  Administered 2021-08-04 – 2021-08-05 (×2): 25 mg via ORAL
  Filled 2021-08-03 (×3): qty 1

## 2021-08-03 MED ORDER — HEPARIN BOLUS VIA INFUSION
5000.0000 [IU] | Freq: Once | INTRAVENOUS | Status: AC
  Administered 2021-08-04: 5000 [IU] via INTRAVENOUS

## 2021-08-03 MED ORDER — ACETAMINOPHEN 500 MG PO TABS: 1000.0000 mg | ORAL_TABLET | Freq: Four times a day (QID) | ORAL | Status: DC | PRN

## 2021-08-03 MED ORDER — GABAPENTIN 100 MG PO CAPS: 100.0000 mg | ORAL_CAPSULE | ORAL | Status: DC

## 2021-08-03 MED ORDER — LIDOCAINE HCL (PF) 1 % IJ SOLN: 5.0000 mL | INTRAMUSCULAR | Status: DC | PRN

## 2021-08-03 MED ORDER — HEPARIN (PORCINE) 25000 UT/250ML-% IV SOLN
2400.0000 [IU]/h | INTRAVENOUS | Status: DC
  Administered 2021-08-04 – 2021-08-05 (×2): 1600 [IU]/h via INTRAVENOUS
  Filled 2021-08-03 (×6): qty 250

## 2021-08-03 MED ORDER — HEPARIN SODIUM (PORCINE) 1000 UNIT/ML DIALYSIS: 1300.0000 [IU] | Freq: Once | INTRAMUSCULAR | Status: AC

## 2021-08-03 MED ORDER — ASPIRIN 81 MG PO TBEC
81.0000 mg | DELAYED_RELEASE_TABLET | Freq: Every day | ORAL | Status: DC
  Administered 2021-08-04 – 2021-08-28 (×22): 81 mg via ORAL
  Filled 2021-08-03 (×22): qty 1

## 2021-08-03 MED ORDER — IOHEXOL 350 MG/ML SOLN
100.0000 mL | Freq: Once | INTRAVENOUS | Status: AC | PRN
  Administered 2021-08-03: 100 mL via INTRAVENOUS

## 2021-08-03 MED ORDER — SODIUM CHLORIDE 0.9 % IV SOLN: 100.0000 mL | INTRAVENOUS | Status: DC | PRN

## 2021-08-03 MED ORDER — OXYCODONE HCL 5 MG PO TABS
5.0000 mg | ORAL_TABLET | ORAL | Status: DC | PRN
  Administered 2021-08-03 – 2021-08-04 (×4): 5 mg via ORAL
  Filled 2021-08-03 (×4): qty 1

## 2021-08-03 MED ORDER — CALCIUM CARBONATE ANTACID 500 MG PO CHEW
1500.0000 mg | CHEWABLE_TABLET | Freq: Three times a day (TID) | ORAL | Status: DC
  Administered 2021-08-04 – 2021-08-24 (×15): 1500 mg via ORAL
  Filled 2021-08-03 (×29): qty 8

## 2021-08-03 MED ORDER — LOPERAMIDE HCL 2 MG PO CAPS: 4.0000 mg | ORAL_CAPSULE | ORAL | Status: DC | PRN

## 2021-08-03 MED ORDER — GABAPENTIN 100 MG PO CAPS
100.0000 mg | ORAL_CAPSULE | Freq: Three times a day (TID) | ORAL | Status: DC
  Administered 2021-08-03 – 2021-08-05 (×7): 100 mg via ORAL
  Filled 2021-08-03 (×8): qty 1

## 2021-08-03 NOTE — Progress Notes (Signed)
Patient is refusing Heparin bolus and drip at this time. Pt states she was told in the ED she does not have a clot. Pt also refused to allow this nurse to assess foot or check pulses with doppler. MD made aware.  ?

## 2021-08-03 NOTE — Plan of Care (Signed)

## 2021-08-03 NOTE — ED Notes (Signed)
Pt alert, NAD, calm, interactive, resps e/u, speaking in clear complete sentences. EDP and EDPA at [facility]. IV established.  ?

## 2021-08-03 NOTE — Progress Notes (Signed)
ANTICOAGULATION CONSULT NOTE - Initial Consult ? ?Pharmacy Consult for Heparin ?Indication:  ischemic limb ? ?Allergies  ?Allergen Reactions  ? Benzonatate Other (See Comments)  ?  Hallucinations  ? Hydralazine Other (See Comments)  ?  Hallucinations  ? Amoxicillin Nausea And Vomiting  ? Clonidine Other (See Comments)  ?  Altered mental state  ? Morphine Itching  ? Oxycodone Itching  ? ? ?Patient Measurements: ?Height: 5\' 5"  (165.1 cm) ?Weight: (!) 163.3 kg (360 lb) ?IBW/kg (Calculated) : 57 ?HEPARIN DW (KG): 98.9  ? ?Vital Signs: ?Temp: 98.1 ?F (36.7 ?C) (04/29 3151) ?Temp Source: Oral (04/29 7616) ?BP: 135/88 (04/29 0737) ?Pulse Rate: 81 (04/29 1130) ? ?Labs: ?Recent Labs  ?  08/03/21 ?1217  ?HGB 7.8*  ?HCT 28.0*  ?PLT 212  ?CREATININE 11.89*  ? ? ?Estimated Creatinine Clearance: 8.8 mL/min (A) (by C-G formula based on SCr of 11.89 mg/dL (H)). ? ? ?Medical History: ?Past Medical History:  ?Diagnosis Date  ? Arthritis   ? Diabetes mellitus without complication ([employer])   ? Dialysis complication, subsequent encounter   ? Gout   ? High cholesterol   ? Hypertension   ? Renal disorder   ? ? ?Medications:  ?See med rec ? ?Assessment: ?51 yo female presents to ED with bilateral foot pain. Patient  with ESRD and on HD T,Th and Sat. Patient states earlier this week she started having bilateral foot pain similar to gout versus neuropathy. Its burning, worse with ambulation, and has been hurting since January 2023. Patient is not on oral anticoagulants. Pharmacy asked to start heparin for concerns of ischemic leg. ? ? ?Goal of Therapy:  ?Heparin level 0.3-0.7 units/ml ?Monitor platelets by anticoagulation protocol: Yes ?  ?Plan:  ?Give 5000 units bolus x 1 ?Start heparin infusion at 1600 units/hr ?Check anti-Xa level in ~6 hours and daily while on heparin ?Continue to monitor H&H and platelets ? ?Isac Sarna, BS Pharm D, BCPS ?Clinical Pharmacist ?08/03/2021,4:40 PM ? ? ?
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
## 2021-08-03 NOTE — Evaluation (Signed)
Physical Therapy Evaluation Patient Details Name: Courtney Gray MRN: [id number] DOB: [birth year] Today's Date: 08/03/2021  History of Present Illness  Courtney Gray is a 51 y.o. female.        Foot Pain        Patient with medical history notable for type 2 diabetes, dialysis, gout, hypertension, hyperlipidemia presents today due to bilateral foot pain and left middle finger pain.  Patient states she started having issues ambulating in January, she has had intermittent gout flare and neuropathy which is impaired her mobility despite walkers and wheelchairs.  She says she was admitted recently and discharged 6 days ago from a SNF.  She is getting home health but states she is unsteady at home, unable to safely take care of herself at her daughter's home.  She does not feel safe in the event of an accident or emergency in the home because she would be unable to ambulate independently.       The finger pain started after checking her blood sugar, she is having pain since then.  She says it was a blood pressure, she cut it with her nail clippers which she swears were clean.  Has not had any purulent discharge but notices pain with flexion of that finger.    Patient states earlier this week she started having bilateral foot pain similar to gout versus neuropathy.  She also states she dropped her oxygen tank on her right foot.  she is on 3 L of supplemental oxygen at baseline    Clinical Impression  Patient is in bed with HOB elevated on therapist arrival.  Agreeable to therapy evaluation.  Patient verbalizes significant bilateral foot pain that limits her function. Her feet are quite swollen and noted 2nd and 3rd toe on the Right foot have darkened skin.  Patient comes to the EOB taking extra time due to pain .  She is able to sit on the EOB with feet supported and hands supported. Therapist assists patient in donning shoes. She performs sit to stand to RW with mod A from therapist with max compliant of pain.   She stands for 20 seconds then needs to sit back down.  Patient states she needs to use the bathroom and has been constipated for several days.  Therapist brings in a [facility] and assists patient from bed to [facility] with min assist step pivot transfer with max compliant of pain.  Therapist left patient sitting on [facility] with nurse call button in reach.  Notified nursing of patient mobility status and leaving her on BSC.  Patient will benefit from continued skilled therapy services during her stay here at the hospital and at the following recommended venue of care to address deficits and promote optimal functional mobility. Of note, patient states she primarily uses WC for mobility but her apartment is not WC accessible; also she is unable to perform sit to stand without assistance and her daughter works some so she is sometimes stuck sitting for hours at a time until her daughter gets home.        Recommendations for follow up therapy are one component of a multi-disciplinary discharge planning process, led by the attending physician.  Recommendations may be updated based on patient status, additional functional criteria and insurance authorization.  Follow Up Recommendations Skilled nursing-short term rehab (<3 hours/day)    Assistance Recommended at Discharge Frequent or constant Supervision/Assistance  Patient can return home with the following  A lot of help with bathing/dressing/bathroom;A lot of help with  walking and/or transfers;Assistance with cooking/housework;Assist for transportation;Help with stairs or ramp for entrance    Equipment Recommendations None recommended by PT  Recommendations for Other Services       Functional Status Assessment Patient has had a recent decline in their functional status and demonstrates the ability to make significant improvements in function in a reasonable and predictable amount of time.     Precautions / Restrictions Precautions Precautions:  Fall Restrictions Weight Bearing Restrictions: No      Mobility  Bed Mobility Overal bed mobility: Needs Assistance (for legs) Bed Mobility: Supine to Sit           General bed mobility comments: takes extra time; needs Assist with legs    Transfers Overall transfer level: Needs assistance Equipment used: Rolling walker (2 wheels) Transfers: Sit to/from Stand, Bed to chair/wheelchair/BSC Sit to Stand: Mod assist, From elevated surface   Step pivot transfers: Mod assist       General transfer comment: feet very painful with transfers, standing    Ambulation/Gait                  Stairs            Wheelchair Mobility    Modified Rankin (Stroke Patients Only)       Balance Overall balance assessment: Needs assistance Sitting-balance support: Bilateral upper extremity supported, Feet supported Sitting balance-[name] Scale: Good     Standing balance support: Bilateral upper extremity supported, During functional activity, Reliant on assistive device for balance Standing balance-[name] Scale: Fair Standing balance comment: fair to good with RW and therapist mod to min A                             Pertinent Vitals/Pain Pain Assessment Pain Assessment: 0-10 Pain Score: 10-Worst pain ever Pain Location: feet Pain Descriptors / Indicators: Constant, Numbness, Burning, Sharp, Sore Pain Intervention(s): Limited activity within patient's tolerance, Monitored during session    Home Living Family/patient expects to be discharged to:: Private residence Living Arrangements: Children Available Help at Discharge: Family;Available PRN/intermittently Type of Home: Apartment Home Access: Stairs to enter (2 steps no railings) Entrance Stairs-Rails: None Entrance Stairs-Number of Steps: 2   Home Layout: One level Home Equipment: Hand held shower head;Shower seat;Cane - single point;Grab bars - tub/shower;Rollator (4 wheels) Additional Comments:  states she is unable to perform sit to stand at home without assistance    Prior Function Prior Level of Function : Needs assist             Mobility Comments: 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 darkened skin 2nd and 3rd toes on Right foot)    Cervical / Trunk Assessment Cervical / Trunk Assessment: Normal (forward flex at trunk with standing.)  Communication   Communication: No difficulties  Cognition Arousal/Alertness: Awake/alert Behavior During Therapy: WFL for tasks assessed/performed Overall Cognitive Status: Within Functional Limits for tasks assessed  General Comments: teary, moaning with standing, transfer sit to stand and bed to [facility]        General Comments      Exercises     Assessment/Plan    PT Assessment Patient needs continued PT services  PT Problem List Decreased strength;Decreased range of motion;Decreased activity tolerance;Decreased balance;Decreased mobility;Decreased coordination;Obesity;Pain       PT Treatment Interventions Gait training;Functional mobility training;Therapeutic activities;Therapeutic exercise;Balance training;Neuromuscular re-education;Patient/family education;Wheelchair mobility training    PT Goals (Current goals can be found in the Care Plan section)  Acute Rehab PT Goals Patient Stated Goal: return home PT Goal Formulation: With patient Potential to Achieve Goals: Good     Frequency Min 2X/week     Co-evaluation               AM-PAC PT "6 Clicks" Mobility  Outcome Measure Help needed turning from your back to your side while in a flat bed without using bedrails?: A Little Help needed moving from lying on your back to sitting on the side of a flat bed without using bedrails?: A Little Help needed moving to and from a bed to a chair (including a wheelchair)?: A Lot Help needed standing up from a chair using your arms (e.g., wheelchair or bedside chair)?: A Lot Help needed to walk in hospital room?: A Lot Help needed climbing 3-5 steps with a railing? : A Lot 6 Click Score: 14    End of Session Equipment Utilized During Treatment: Oxygen Activity Tolerance: Patient limited by pain;Patient limited by fatigue Patient left: with call bell/phone within reach;Other (comment) (on [facility]; nursing notified) Nurse Communication: Mobility status;Other (comment) (patient on [facility]) PT Visit Diagnosis: Other abnormalities of gait and mobility (R26.89);Muscle weakness (generalized) (M62.81);Difficulty in walking, not elsewhere classified (R26.2);Pain Pain - Right/Left: Left Pain - part of body: Ankle and joints of foot    Time: 1610-9604 PT Time Calculation (min) (ACUTE ONLY): 33 min   Charges:   PT Evaluation $PT Eval Low Complexity: 1 Low PT Treatments $Therapeutic Activity: 8-22 mins        11:35 AM, 08/03/21 [place] Small Bardia Wangerin MPT [facility] [place] [phone] Ph:[phone]

## 2021-08-03 NOTE — ED Notes (Signed)
Pt helped off bedside commode and back into bed. Pt given hand sanitizer per request.  ?

## 2021-08-03 NOTE — ED Triage Notes (Signed)
Pt arrived by EMS from [facility] complaining of bilateral foot pain from her gout. States she has not been able to walk due to pain. Pt also dropped an O2 tank on her left foot. Was recently d/c from rehab facility on Monday.  ?Middle finger on left hand started to swell and cause pain.  ? ?Pt on 3L [place] chronically  ?

## 2021-08-03 NOTE — H&P (Signed)
?History and Physical  ? ? ?Courtney Gray MBE:[id number] DOB: 09/16/1970 DOA: 08/03/2021 ? ?PCP: Medicine, [facility]  ?Patient coming from: Home ? ?Chief Complaint: Foot pain right greater than left cannot walk needs to go back to rehab ? ?HPI: Courtney Gray is a 51 y.o. female with medical history significant of end-stage renal disease on dialysis Tuesday Thursday Saturday, gout, diabetes, hypertension just discharged from rehab this past Monday was told that she ran out of her days and that she needed to pay out-of-pocket for any further rehab.  She has [place] in [employer].  She was sent home.  She reports that she has been having pain particularly in her right foot for several weeks.  She is overall a poor historian.  Patient insist this is her gout.  Patient is being referred for admission by the emergency department for placement and gout in her feet.  However on closer examination patient appears to have an ischemic right foot with her second and third toes black and blue and necrotic appearing.  Patient says that she does not know how long her toes have been that way but it is been probably several weeks.  She was hospitalized here and discharged on April 7 she does not recall if her foot looked like that but they have been painful for quite some time.  Case management on-call has been called and are working on getting her back into rehab as patient does not feel like she can ambulate and get around because of the pain.  She is missed her Thursday dialysis.  She denies any shortness of breath or chest pain. ? ?ED Course: Case management called for placement, nephrology service also consulted ? ?Review of Systems: As per HPI otherwise 10 point review of systems negative.  ? ?Past Medical History:  ?Diagnosis Date  ? Arthritis   ? Diabetes mellitus without complication ([place])   ? Dialysis complication, subsequent encounter   ? Gout   ? High cholesterol   ? Hypertension   ? Renal disorder   ? ? ?Past Surgical  History:  ?Procedure Laterality Date  ? AV FISTULA PLACEMENT    ? x2, unsuccessful.   ? IR FLUORO GUIDE CV LINE LEFT  12/14/2020  ? IR FLUORO GUIDE CV LINE LEFT  02/20/2021  ? IR FLUORO GUIDE CV LINE RIGHT  08/28/2020  ? IR FLUORO GUIDE CV LINE RIGHT  05/13/2021  ? IR FLUORO GUIDE CV LINE RIGHT  05/24/2021  ? IR PERC TUN PERIT CATH WO PORT S&I /IMAG  09/06/2020  ? IR PTA VENOUS EXCEPT DIALYSIS CIRCUIT  02/20/2021  ? IR PTA VENOUS EXCEPT DIALYSIS CIRCUIT  05/13/2021  ? IR REMOVAL TUN CV CATH W/O FL  05/24/2021  ? IR US GUIDE VASC ACCESS LEFT  09/06/2020  ? IR US GUIDE VASC ACCESS RIGHT  05/24/2021  ? IR VENOCAVAGRAM SVC  02/20/2021  ? ? ? reports that she has never smoked. She has never used smokeless tobacco. She reports that she does not drink alcohol and does not use drugs. ? ?Allergies  ?Allergen Reactions  ? Benzonatate Other (See Comments)  ?  Hallucinations  ? Hydralazine Other (See Comments)  ?  Hallucinations  ? Amoxicillin Nausea And Vomiting  ? Clonidine Other (See Comments)  ?  Altered mental state  ? Morphine Itching  ? Oxycodone Itching  ? ? ?Family History  ?Problem Relation Age of Onset  ? Diabetes Mother   ? Diabetes Father   ? ? ?Prior to Admission  medications   ?Medication Sig Start Date End Date Taking? Authorizing Provider  ?acetaminophen (TYLENOL) 500 MG tablet Take 1,000 mg by mouth every 6 (six) hours as needed for moderate pain or headache.   Yes [provider]  ?aspirin EC 81 MG tablet Take 1 tablet (81 mg total) by mouth daily with breakfast. 11/28/20  Yes Emokpae, Courage, MD  ?atorvastatin (LIPITOR) 10 MG tablet Take 1 tablet (10 mg total) by mouth daily. ?Patient taking differently: Take 10 mg by mouth at bedtime. 11/28/20  Yes Roxan Hockey, MD  ?calcium carbonate (TUMS SMOOTHIES) 750 MG chewable tablet Chew 750-1,500 mg by mouth See admin instructions. Take 1500 mg with each meal and 750 mg with each snack   Yes [provider]  ?carvedilol (COREG) 25 MG tablet Take 1 tablet (25  mg total) by mouth 2 (two) times daily. ?Patient taking differently: Take 25 mg by mouth 2 (two) times daily. Do not take on dialysis days Tuesday Thur. And Sat. Take as needed. Take if blood pressure is over 140 11/28/20  Yes Emokpae, Courage, MD  ?diphenhydrAMINE (BENADRYL) 25 MG tablet Take 25 mg by mouth daily as needed for allergies or sleep.   Yes [provider]  ?docusate sodium (COLACE) 100 MG capsule Take 100 mg by mouth 2 (two) times daily.   Yes [provider]  ?gabapentin (NEURONTIN) 100 MG capsule Take 100-200 mg by mouth See admin instructions. 1 tablet in the morning and 1 tablet around 5pm then take 1 tablet at bedtime. Dialysis days take 2 capsules at 5pm 04/20/19  Yes [provider]  ?loperamide (IMODIUM A-D) 2 MG tablet Take 4 mg by mouth as needed for diarrhea or loose stools.   Yes [provider]  ?nitroGLYCERIN (NITROSTAT) 0.4 MG SL tablet Place 1 tablet (0.4 mg total) under the tongue every 5 (five) minutes as needed for chest pain. 05/24/21  Yes [place] Donnamarie Poag, DO  ?oxyCODONE (OXY IR/ROXICODONE) 5 MG immediate release tablet Take 1 tablet (5 mg total) by mouth every 4 (four) hours as needed for breakthrough pain. 07/12/21  Yes Shah, Pratik D, DO  ?pantoprazole (PROTONIX) 40 MG tablet Take 1 tablet (40 mg total) by mouth daily. 11/29/20  Yes Roxan Hockey, MD  ?Phenol (CHLORASEPTIC MT) Use as directed 4 sprays in the mouth or throat 4 (four) times daily as needed (throat pain).   Yes [provider]  ?simethicone (MYLICON) 644 MG chewable tablet Chew 125 mg by mouth 2 (two) times daily as needed for flatulence.   Yes [provider]  ?amLODipine (NORVASC) 10 MG tablet Take 1 tablet (10 mg total) by mouth daily. ?Patient taking differently: Take 10 mg by mouth daily as needed (Blood pressusre). 11/29/20   Roxan Hockey, MD  ? ? ?Physical Exam: ?Vitals:  ? 08/03/21 0934 08/03/21 0937 08/03/21 1130  ?BP:  135/88   ?Pulse:  92 81  ?Resp:   (!) 23   ?Temp:  98.1 ?F (36.7 ?C)   ?TempSrc:  Oral   ?SpO2: 96% 100% 100%  ?Weight:  (!) 163.3 kg   ?Height:  5\' 5"  (1.651 m)   ? ? ? ? ?Constitutional: NAD, calm, comfortable ?Vitals:  ? 08/03/21 0934 08/03/21 0347 08/03/21 1130  ?BP:  135/88   ?Pulse:  92 81  ?Resp:  (!) 23   ?Temp:  98.1 ?F (36.7 ?C)   ?TempSrc:  Oral   ?SpO2: 96% 100% 100%  ?Weight:  (!) 163.3 kg   ?Height:  5\' 5"  (1.651 m)   ? ?Eyes: PERRL, lids and conjunctivae normal ?ENMT: Mucous membranes are moist. Posterior pharynx clear of any exudate or lesions.Normal dentition.  ?Neck: normal, supple, no masses, no thyromegaly ?Respiratory: clear to auscultation bilaterally, no wheezing, no crackles. Normal respiratory effort. No accessory muscle use.  ?Cardiovascular: Regular rate and rhythm, no murmurs / rubs / gallops.  1+ bilateral lower edema.  Decreased bilateral pedal pulses. No carotid bruits.  ?Abdomen: no tenderness, no masses palpated. No hepatosplenomegaly. Bowel sounds positive.  ?Musculoskeletal: no clubbing /positive lower extremity cyanosis. No joint deformity upper and lower extremities. Good ROM, no contractures. Normal muscle tone.  ?Skin: Patient has ischemic right second and third toes with some bluish discoloration on her forefoot her left foot appears better perfused to the her right there is some surrounding erythema to the right second and third toes also ?Neurologic: CN 2-12 grossly intact. Sensation intact, DTR normal. Strength 5/5 in all 4.  ?Psychiatric: Normal judgment and insight. Alert and oriented x 3. Normal mood.  ? ? ?Labs on Admission: I have personally reviewed following labs and imaging studies ? ?CBC: ?Recent Labs  ?Lab 08/03/21 ?1217  ?WBC 10.0  ?NEUTROABS 7.8*  ?HGB 7.8*  ?HCT 28.0*  ?MCV 102.9*  ?PLT 212  ? ?Basic Metabolic Panel: ?Recent Labs  ?Lab 08/03/21 ?1217  ?NA 139  ?K 4.0  ?CL 99  ?CO2 24  ?GLUCOSE 160*  ?BUN 52*  ?CREATININE 11.89*  ?CALCIUM 7.9*  ? ?GFR: ?Estimated Creatinine Clearance: 8.8 mL/min  (A) (by C-G formula based on SCr of 11.89 mg/dL (H)). ?Liver Function Tests: ?Recent Labs  ?Lab 08/03/21 ?1217  ?AST 17  ?ALT 15  ?ALKPHOS 61  ?BILITOT 0.7  ?PROT 7.2  ?ALBUMIN 3.1*  ? ?No results fo

## 2021-08-03 NOTE — Progress Notes (Signed)
CSW received notification from PA stating patient needs additional therapies as she is unable to function independently at home without assistance. Patient agreeable to placement in [facility]. ? ?CSW completed FL2 and faxed patient's clinicals to local facilities for review. ? ?Madilyn Fireman, MSW, LCSW ?Transitions of Care  Clinical Social Worker II ?[phone] ? ?

## 2021-08-03 NOTE — NC FL2 (Signed)
?[place] MEDICAID FL2 LEVEL OF CARE SCREENING TOOL  ?  ? ?IDENTIFICATION  ?Patient Name: ?Courtney Gray Birthdate: 1970/09/06 Sex: female Admission Date (Current Location): ?08/03/2021  ?[place] and [place] Number: ? [place] and Address:  ?[place] [address] [place] ?     Provider Number: ?[id number]  ?Attending Physician Name and Address:  ?Milton Ferguson, MD ? Relative Name and Phone Number:  ?  ?   ?Current Level of Care: ?Hospital Recommended Level of Care: ?[place] Prior Approval Number: ?  ? ?Date Approved/Denied: ?  PASRR Number: ?[id number] A ? ?Discharge Plan: ?SNF ?  ? ?Current Diagnoses: ?Patient Active Problem List  ? Diagnosis Date Noted  ? Hypoglycemia 07/10/2021  ? Volume overload 07/09/2021  ? Acute pulmonary edema (HCC)   ? Acute gout 06/11/2021  ? Hyperkalemia 05/21/2021  ? Hemorrhoids   ? Hyperlipidemia 05/18/2021  ? Rectal bleeding 05/17/2021  ? Morbid obesity with BMI of 60.0-69.9, adult ([place]) 05/17/2021  ? NSTEMI (non-ST elevated myocardial infarction) ([place]) 05/16/2021  ? HTN (hypertension) 09/01/2020  ? ESRD (end stage renal disease) ([place]) 09/01/2020  ? Chronic respiratory failure with hypoxia ([place]) 09/01/2020  ? Hypertensive urgency 11/28/2019  ? Type 2 diabetes mellitus with other specified complication ([place]) 58/52/7782  ? Anemia of chronic disease 11/28/2019  ? ? ?Orientation RESPIRATION BLADDER Height & Weight   ?  ?Self, Time, Situation, Place ? O2 (4L) Incontinent Weight: (!) 360 lb (163.3 kg) ?Height:  5\' 5"  (165.1 cm)  ?BEHAVIORAL SYMPTOMS/MOOD NEUROLOGICAL BOWEL NUTRITION STATUS  ?    Incontinent Diet  ?AMBULATORY STATUS COMMUNICATION OF NEEDS Skin   ?Extensive Assist Verbally Normal ?  ?  ?  ?    ?     ?     ? ? ?Personal Care Assistance Level of Assistance  ?Bathing, Feeding, Dressing Bathing Assistance: Maximum assistance ?Feeding assistance: Limited assistance ?Dressing Assistance: Maximum assistance ?    ? ?Functional Limitations Info  ?Sight, Hearing, Speech Sight Info: Adequate ?Hearing Info: Adequate ?Speech Info: Adequate  ? ? ?SPECIAL CARE FACTORS FREQUENCY  ?PT (By licensed PT), OT (By licensed OT)   ?  ?PT Frequency: 5x weekly ?OT Frequency: 5x weekly ?  ?  ?  ?   ? ? ?Contractures Contractures Info: Not present  ? ? ?Additional Factors Info  ?Code Status, Allergies Code Status Info: Full Code ?Allergies Info: Benzonatate, Hydralazine, Amoxicillin, Clonidine, Morphine, Oxycodone ?  ?  ?  ?   ? ?Current Medications (08/03/2021):  This is the current hospital active medication list ?No current facility-administered medications for this encounter.  ? ?Current Outpatient Medications  ?Medication Sig Dispense Refill  ? acetaminophen (TYLENOL) 500 MG tablet Take 1,000 mg by mouth every 6 (six) hours as needed for moderate pain or headache.    ? amLODipine (NORVASC) 10 MG tablet Take 1 tablet (10 mg total) by mouth daily. (Patient taking differently: Take 10 mg by mouth daily as needed (Blood pressusre).) 30 tablet 5  ? aspirin EC 81 MG tablet Take 1 tablet (81 mg total) by mouth daily with breakfast. 30 tablet 11  ? atorvastatin (LIPITOR) 10 MG tablet Take 1 tablet (10 mg total) by mouth daily. (Patient taking differently: Take 10 mg by mouth at bedtime.) 30 tablet 5  ? calcium carbonate (TUMS SMOOTHIES) 750 MG chewable tablet Chew 750-1,500 mg by mouth See admin instructions. Take 1500 mg with each meal and 750 mg with each snack    ?  carvedilol (COREG) 25 MG tablet Take 1 tablet (25 mg total) by mouth 2 (two) times daily. (Patient taking differently: Take 25 mg by mouth 2 (two) times daily. Do not take on dialysis days Tuesday Thur. And Sat. Take as needed. Take if blood pressure is over 140) 60 tablet 3  ? diphenhydrAMINE (BENADRYL) 25 MG tablet Take 25 mg by mouth daily as needed for allergies or sleep.    ? docusate sodium (COLACE) 100 MG capsule Take 100 mg by mouth 2 (two) times daily.    ? gabapentin  (NEURONTIN) 100 MG capsule Take 100-200 mg by mouth See admin instructions. 1 tablet in the morning and 1 tablet around 5pm then take 1 tablet at bedtime. Dialysis days take 2 capsules at 5pm    ? loperamide (IMODIUM A-D) 2 MG tablet Take 4 mg by mouth as needed for diarrhea or loose stools.    ? nitroGLYCERIN (NITROSTAT) 0.4 MG SL tablet Place 1 tablet (0.4 mg total) under the tongue every 5 (five) minutes as needed for chest pain. 10 tablet 3  ? oxyCODONE (OXY IR/ROXICODONE) 5 MG immediate release tablet Take 1 tablet (5 mg total) by mouth every 4 (four) hours as needed for breakthrough pain. 10 tablet 0  ? pantoprazole (PROTONIX) 40 MG tablet Take 1 tablet (40 mg total) by mouth daily. 30 tablet 5  ? Phenol (CHLORASEPTIC MT) Use as directed 4 sprays in the mouth or throat 4 (four) times daily as needed (throat pain).    ? simethicone (MYLICON) 741 MG chewable tablet Chew 125 mg by mouth 2 (two) times daily as needed for flatulence.    ? ? ? ?Discharge Medications: ?Please see discharge summary for a list of discharge medications. ? ?Relevant Imaging Results: ? ?Relevant Lab Results: ? ? ?Additional Information ?SSN: [id number]. Dialysis at [place] 2nd shift. ? ?Archie Endo, LCSW ? ? ? ? ?

## 2021-08-04 DIAGNOSIS — N186 End stage renal disease: Secondary | ICD-10-CM | POA: Diagnosis not present

## 2021-08-04 DIAGNOSIS — M1990 Unspecified osteoarthritis, unspecified site: Secondary | ICD-10-CM | POA: Diagnosis present

## 2021-08-04 DIAGNOSIS — Z9981 Dependence on supplemental oxygen: Secondary | ICD-10-CM | POA: Diagnosis not present

## 2021-08-04 DIAGNOSIS — I252 Old myocardial infarction: Secondary | ICD-10-CM | POA: Diagnosis not present

## 2021-08-04 DIAGNOSIS — E11649 Type 2 diabetes mellitus with hypoglycemia without coma: Secondary | ICD-10-CM | POA: Diagnosis not present

## 2021-08-04 DIAGNOSIS — Z6841 Body Mass Index (BMI) 40.0 and over, adult: Secondary | ICD-10-CM | POA: Diagnosis not present

## 2021-08-04 DIAGNOSIS — E1152 Type 2 diabetes mellitus with diabetic peripheral angiopathy with gangrene: Secondary | ICD-10-CM | POA: Diagnosis present

## 2021-08-04 DIAGNOSIS — E1143 Type 2 diabetes mellitus with diabetic autonomic (poly)neuropathy: Secondary | ICD-10-CM | POA: Diagnosis present

## 2021-08-04 DIAGNOSIS — Z833 Family history of diabetes mellitus: Secondary | ICD-10-CM | POA: Diagnosis not present

## 2021-08-04 DIAGNOSIS — R52 Pain, unspecified: Secondary | ICD-10-CM | POA: Diagnosis not present

## 2021-08-04 DIAGNOSIS — D631 Anemia in chronic kidney disease: Secondary | ICD-10-CM | POA: Diagnosis not present

## 2021-08-04 DIAGNOSIS — I998 Other disorder of circulatory system: Secondary | ICD-10-CM | POA: Diagnosis not present

## 2021-08-04 DIAGNOSIS — Z992 Dependence on renal dialysis: Secondary | ICD-10-CM | POA: Diagnosis not present

## 2021-08-04 DIAGNOSIS — J9611 Chronic respiratory failure with hypoxia: Secondary | ICD-10-CM | POA: Diagnosis present

## 2021-08-04 DIAGNOSIS — L0390 Cellulitis, unspecified: Secondary | ICD-10-CM | POA: Diagnosis not present

## 2021-08-04 DIAGNOSIS — I120 Hypertensive chronic kidney disease with stage 5 chronic kidney disease or end stage renal disease: Secondary | ICD-10-CM | POA: Diagnosis not present

## 2021-08-04 DIAGNOSIS — E7800 Pure hypercholesterolemia, unspecified: Secondary | ICD-10-CM | POA: Diagnosis not present

## 2021-08-04 DIAGNOSIS — I2510 Atherosclerotic heart disease of native coronary artery without angina pectoris: Secondary | ICD-10-CM | POA: Diagnosis present

## 2021-08-04 DIAGNOSIS — M109 Gout, unspecified: Secondary | ICD-10-CM | POA: Diagnosis present

## 2021-08-04 DIAGNOSIS — M898X9 Other specified disorders of bone, unspecified site: Secondary | ICD-10-CM | POA: Diagnosis present

## 2021-08-04 DIAGNOSIS — E1122 Type 2 diabetes mellitus with diabetic chronic kidney disease: Secondary | ICD-10-CM | POA: Diagnosis present

## 2021-08-04 DIAGNOSIS — E1142 Type 2 diabetes mellitus with diabetic polyneuropathy: Secondary | ICD-10-CM | POA: Diagnosis present

## 2021-08-04 DIAGNOSIS — G4710 Hypersomnia, unspecified: Secondary | ICD-10-CM | POA: Diagnosis not present

## 2021-08-04 DIAGNOSIS — G546 Phantom limb syndrome with pain: Secondary | ICD-10-CM | POA: Diagnosis not present

## 2021-08-04 DIAGNOSIS — M79673 Pain in unspecified foot: Secondary | ICD-10-CM | POA: Diagnosis not present

## 2021-08-04 DIAGNOSIS — F32A Depression, unspecified: Secondary | ICD-10-CM | POA: Diagnosis present

## 2021-08-04 DIAGNOSIS — Z5919 Other inadequate housing: Secondary | ICD-10-CM | POA: Diagnosis not present

## 2021-08-04 DIAGNOSIS — I70261 Atherosclerosis of native arteries of extremities with gangrene, right leg: Secondary | ICD-10-CM | POA: Diagnosis present

## 2021-08-04 DIAGNOSIS — I10 Essential (primary) hypertension: Secondary | ICD-10-CM | POA: Diagnosis not present

## 2021-08-04 DIAGNOSIS — E1129 Type 2 diabetes mellitus with other diabetic kidney complication: Secondary | ICD-10-CM | POA: Diagnosis not present

## 2021-08-04 DIAGNOSIS — E119 Type 2 diabetes mellitus without complications: Secondary | ICD-10-CM | POA: Diagnosis not present

## 2021-08-04 DIAGNOSIS — I96 Gangrene, not elsewhere classified: Secondary | ICD-10-CM | POA: Diagnosis not present

## 2021-08-04 DIAGNOSIS — N250 Renal osteodystrophy: Secondary | ICD-10-CM | POA: Diagnosis not present

## 2021-08-04 LAB — CBC
HCT: 28.2 % — ABNORMAL LOW (ref 36.0–46.0)
Hemoglobin: 7.8 g/dL — ABNORMAL LOW (ref 12.0–15.0)
MCH: 27.8 pg (ref 26.0–34.0)
MCHC: 27.7 g/dL — ABNORMAL LOW (ref 30.0–36.0)
MCV: 100.4 fL — ABNORMAL HIGH (ref 80.0–100.0)
Platelets: 247 10*3/uL (ref 150–400)
RBC: 2.81 MIL/uL — ABNORMAL LOW (ref 3.87–5.11)
RDW: 18 % — ABNORMAL HIGH (ref 11.5–15.5)
WBC: 10.1 10*3/uL (ref 4.0–10.5)
nRBC: 0.2 % (ref 0.0–0.2)

## 2021-08-04 LAB — BASIC METABOLIC PANEL
Anion gap: 15 (ref 5–15)
BUN: 57 mg/dL — ABNORMAL HIGH (ref 6–20)
CO2: 24 mmol/L (ref 22–32)
Calcium: 8 mg/dL — ABNORMAL LOW (ref 8.9–10.3)
Chloride: 98 mmol/L (ref 98–111)
Creatinine, Ser: 13.97 mg/dL — ABNORMAL HIGH (ref 0.44–1.00)
GFR, Estimated: 3 mL/min — ABNORMAL LOW (ref >60)
Glucose, Bld: 108 mg/dL — ABNORMAL HIGH (ref 70–99)
Potassium: 3.7 mmol/L (ref 3.5–5.1)
Sodium: 137 mmol/L (ref 135–145)

## 2021-08-04 LAB — GLUCOSE, CAPILLARY
Glucose-Capillary: 100 mg/dL — ABNORMAL HIGH (ref 70–99)
Glucose-Capillary: 110 mg/dL — ABNORMAL HIGH (ref 70–99)

## 2021-08-04 LAB — PHOSPHORUS: Phosphorus: 6.5 mg/dL — ABNORMAL HIGH (ref 2.5–4.6)

## 2021-08-04 LAB — HEPARIN LEVEL (UNFRACTIONATED)
Heparin Unfractionated: <0.1 IU/mL — ABNORMAL LOW (ref 0.30–0.70)
Heparin Unfractionated: 0.57 IU/mL (ref 0.30–0.70)

## 2021-08-04 MED ORDER — MORPHINE SULFATE (PF) 4 MG/ML IV SOLN
4.0000 mg | INTRAVENOUS | Status: DC | PRN
  Administered 2021-08-04: 4 mg via INTRAVENOUS
  Filled 2021-08-04: qty 1

## 2021-08-04 MED ORDER — HEPARIN SODIUM (PORCINE) 1000 UNIT/ML IJ SOLN
INTRAMUSCULAR | Status: AC
  Administered 2021-08-04: 1300 [IU] via INTRAVENOUS_CENTRAL
  Filled 2021-08-04: qty 7

## 2021-08-04 MED ORDER — CALCIUM ACETATE (PHOS BINDER) 667 MG PO CAPS: 1334.0000 mg | ORAL_CAPSULE | Freq: Three times a day (TID) | ORAL | Status: DC

## 2021-08-04 MED ORDER — OXYCODONE HCL 5 MG PO TABS
10.0000 mg | ORAL_TABLET | ORAL | Status: DC | PRN
Start: 2021-08-04 — End: 2021-08-05
  Administered 2021-08-05 (×2): 10 mg via ORAL
  Filled 2021-08-04 (×3): qty 2

## 2021-08-04 MED ORDER — BACITRACIN-NEOMYCIN-POLYMYXIN OINTMENT TUBE
TOPICAL_OINTMENT | Freq: Two times a day (BID) | CUTANEOUS | Status: DC
  Administered 2021-08-06 – 2021-08-09 (×3): 1 via TOPICAL
  Filled 2021-08-04: qty 14.17

## 2021-08-04 MED ORDER — MORPHINE SULFATE (PF) 4 MG/ML IV SOLN
4.0000 mg | Freq: Once | INTRAVENOUS | Status: AC
  Administered 2021-08-04: 4 mg via INTRAVENOUS
  Filled 2021-08-04: qty 1

## 2021-08-04 NOTE — Progress Notes (Signed)
ANTICOAGULATION CONSULT NOTE -  ? ?Pharmacy Consult for Heparin ?Indication:  ischemic limb ? ?Allergies  ?Allergen Reactions  ? Benzonatate Other (See Comments)  ?  Hallucinations  ? Hydralazine Other (See Comments)  ?  Hallucinations  ? Amoxicillin Nausea And Vomiting  ? Clonidine Other (See Comments)  ?  Altered mental state  ? Morphine Itching  ? Oxycodone Itching  ?  Pt tolerated hospital admission 07/2021  ? ? ?Patient Measurements: ?Height: 5\' 5"  (165.1 cm) ?Weight: (!) 173.3 kg (382 lb 0.9 oz) ?IBW/kg (Calculated) : 57 ?HEPARIN DW (KG): 98.9  ? ?Vital Signs: ?BP: 164/73 (04/30 1900) ?Pulse Rate: 86 (04/30 1900) ? ?Labs: ?Recent Labs  ?  08/03/21 ?1217 08/04/21 ?5621 08/04/21 ?1714  ?HGB 7.8* 7.8*  --   ?HCT 28.0* 28.2*  --   ?PLT 212 247  --   ?HEPARINUNFRC  --  <0.10* 0.57  ?CREATININE 11.89* 13.97*  --   ?CKTOTAL 131  --   --   ? ? ? ?Estimated Creatinine Clearance: 7.8 mL/min (A) (by C-G formula based on SCr of 13.97 mg/dL (H)). ? ? ?Medical History: ?Past Medical History:  ?Diagnosis Date  ? Arthritis   ? Diabetes mellitus without complication ([place])   ? Dialysis complication, subsequent encounter   ? Gout   ? High cholesterol   ? Hypertension   ? Renal disorder   ? ? ?Medications:  ?See med rec ? ?Assessment: ?51 yo female presents to ED with bilateral foot pain. Patient  with ESRD and on HD T,Th and Sat. Patient states earlier this week she started having bilateral foot pain similar to gout versus neuropathy. Its burning, worse with ambulation, and has been hurting since January 2023. Patient is not on oral anticoagulants. Pharmacy asked to start heparin for concerns of ischemic leg.  ?4/30 Patient finallly agreed to start heparin today ? ?HL 0.57, therapeutic, No issues with infusion ? ?Goal of Therapy:  ?Heparin level 0.3-0.7 units/ml ?Monitor platelets by anticoagulation protocol: Yes ?  ?Plan:  ?Continue heparin infusion at 1600 units/hr ?Check anti-Xa level in ~6 hours and daily while on  heparin ?Continue to monitor H&H and platelets ? ?Isac Sarna, BS Pharm D, BCPS ?Clinical Pharmacist ?08/04/2021,7:05 PM ? ? ?

## 2021-08-04 NOTE — Progress Notes (Signed)
? ?Fruma [place] WOE:[id number] DOB: 1970-06-25 DOA: 08/03/2021 ? ?PCP: [facility], [facility]  ?Patient coming from: Home ? ?Chief Complaint: Foot pain right greater than left cannot walk needs to go back to rehab ? ?HPI: Courtney Gray is a 51 y.o. female with medical history significant of end-stage renal disease on dialysis Tuesday Thursday Saturday, gout, diabetes, hypertension just discharged from rehab this past Monday was told that she ran out of her days and that she needed to pay out-of-pocket for any further rehab.  She has [place] in [employer].  She was sent home.  She reports that she has been having pain particularly in her right foot for several weeks.  She is overall a poor historian.  Patient insist this is her gout.  Patient is being referred for admission by the emergency department for placement and gout in her feet.  However on closer examination patient appears to have an ischemic right foot with her second and third toes black and blue and necrotic appearing.  Patient says that she does not know how long her toes have been that way but it is been probably several weeks.  She was hospitalized here and discharged on April 7 she does not recall if her foot looked like that but they have been painful for quite some time.  Case management on-call has been called and are working on getting her back into rehab as patient does not feel like she can ambulate and get around because of the pain.  She is missed her Thursday dialysis.  She denies any shortness of breath or chest pain. ? ?ED Course: Case management called for placement, nephrology service also consulted ? ?Subjective ?Foot still painful.  Toe still black.  Skin started to slough off.  Patient refused heparin drip last night.  She also refused for [place] to check Doppler pulses of her feet. ? ?Physical Exam: ?Vitals:  ? 08/03/21 1800 08/03/21 2205 08/04/21 0434 08/04/21 0500  ?BP: (!) 148/98 (!) 158/66 (!) 152/60   ?Pulse: 84 96 100   ?Resp: 20  20 20    ?Temp: 98.3 ?F (36.8 ?C)  98.1 ?F (36.7 ?C)   ?TempSrc:   Oral   ?SpO2: 100% 100% 97%   ?Weight:    (!) 173.4 kg  ?Height:      ? ? ? ? ?Constitutional: NAD, calm, comfortable ?Vitals:  ? 08/03/21 1800 08/03/21 2205 08/04/21 0434 08/04/21 0500  ?BP: (!) 148/98 (!) 158/66 (!) 152/60   ?Pulse: 84 96 100   ?Resp: 20 20 20    ?Temp: 98.3 ?F (36.8 ?C)  98.1 ?F (36.7 ?C)   ?TempSrc:   Oral   ?SpO2: 100% 100% 97%   ?Weight:    (!) 173.4 kg  ?Height:      ? ?Eyes: PERRL, lids and conjunctivae normal ?ENMT: Mucous membranes are moist. Posterior pharynx clear of any exudate or lesions.Normal dentition.  ?Neck: normal, supple, no masses, no thyromegaly ?Respiratory: clear to auscultation bilaterally, no wheezing, no crackles. Normal respiratory effort. No accessory muscle use.  ?Cardiovascular: Regular rate and rhythm, no murmurs / rubs / gallops.  1+ bilateral lower edema.  Decreased bilateral pedal pulses. No carotid bruits.  ?Abdomen: no tenderness, no masses palpated. No hepatosplenomegaly. Bowel sounds positive.  ?Musculoskeletal: no clubbing /positive lower extremity cyanosis. No joint deformity upper and lower extremities. Good ROM, no contractures. Normal muscle tone.  ?Skin: Patient has ischemic right second and third toes with some bluish discoloration on her forefoot her left foot appears better  perfused to the her right there is some surrounding erythema to the right second and third toes also ?Neurologic: CN 2-12 grossly intact. Sensation intact, DTR normal. Strength 5/5 in all 4.  ?Psychiatric: Normal judgment and insight. Alert and oriented x 3. Normal mood.  ? ? ?Labs on Admission: I have personally reviewed following labs and imaging studies ? ?CBC: ?Recent Labs  ?Lab 08/03/21 ?1217 08/04/21 ?1962  ?WBC 10.0 10.1  ?NEUTROABS 7.8*  --   ?HGB 7.8* 7.8*  ?HCT 28.0* 28.2*  ?MCV 102.9* 100.4*  ?PLT 212 247  ? ?Basic Metabolic Panel: ?Recent Labs  ?Lab 08/03/21 ?1217 08/04/21 ?2297  ?NA 139 137  ?K 4.0 3.7   ?CL 99 98  ?CO2 24 24  ?GLUCOSE 160* 108*  ?BUN 52* 57*  ?CREATININE 11.89* 13.97*  ?CALCIUM 7.9* 8.0*  ? ?GFR: ?Estimated Creatinine Clearance: 7.8 mL/min (A) (by C-G formula based on SCr of 13.97 mg/dL (H)). ?Liver Function Tests: ?Recent Labs  ?Lab 08/03/21 ?1217  ?AST 17  ?ALT 15  ?ALKPHOS 61  ?BILITOT 0.7  ?PROT 7.2  ?ALBUMIN 3.1*  ? ?No results for input(s): LIPASE, AMYLASE in the last 168 hours. ?No results for input(s): AMMONIA in the last 168 hours. ?Coagulation Profile: ?No results for input(s): INR, PROTIME in the last 168 hours. ?Cardiac Enzymes: ?Recent Labs  ?Lab 08/03/21 ?1217  ?CKTOTAL 131  ? ?BNP (last 3 results) ?No results for input(s): PROBNP in the last 8760 hours. ?HbA1C: ?No results for input(s): HGBA1C in the last 72 hours. ?CBG: ?Recent Labs  ?Lab 08/03/21 ?1141 08/04/21 ?0724  ?GLUCAP 143* 100*  ? ?Lipid Profile: ?No results for input(s): CHOL, HDL, LDLCALC, TRIG, CHOLHDL, LDLDIRECT in the last 72 hours. ?Thyroid Function Tests: ?No results for input(s): TSH, T4TOTAL, FREET4, T3FREE, THYROIDAB in the last 72 hours. ?Anemia Panel: ?No results for input(s): VITAMINB12, FOLATE, FERRITIN, TIBC, IRON, RETICCTPCT in the last 72 hours. ?Urine analysis: ?No results found for: COLORURINE, APPEARANCEUR, [place], [place], [place], [place], [place], KETONESUR, PROTEINUR, [place], NITRITE, LEUKOCYTESUR ?Sepsis Labs: !!!!!!!!!!!!!!!!!!!!!!!!!!!!!!!!!!!!!!!!!!!! ?@[email or web address] (procalcitonin:4,lacticidven:4) ?)No results found for this or any previous visit (from the past 240 hour(s)).  ? ?Radiological Exams on Admission: ?CT Angio Aortobifemoral W and/or Wo Contrast ? ?Result Date: 08/03/2021 ?CLINICAL DATA:  Provided history: Claudication or leg ischemia Peripheral arterial disease (PAD), asymptomatic EXAM: CT ANGIOGRAPHY OF ABDOMINAL AORTA WITH ILIOFEMORAL RUNOFF TECHNIQUE: Multidetector CT imaging of the abdomen, pelvis and lower extremities was performed using the standard protocol during bolus  administration of intravenous contrast. Multiplanar CT image reconstructions and MIPs were obtained to evaluate the vascular anatomy. RADIATION DOSE REDUCTION: This exam was performed according to the departmental dose-optimization program which includes automated exposure control, adjustment of the mA and/or kV according to patient size and/or use of iterative reconstruction technique. CONTRAST:  174mL OMNIPAQUE IOHEXOL 350 MG/ML SOLN COMPARISON:  None. FINDINGS: VASCULAR There is poor arterial opacification, examination is marginally diagnostic best. Aorta: Aortic atherosclerosis. No aortic aneurysm. No severe stenosis. Celiac: Densely calcified throughout its course.  No acute findings. SMA: Densely calcified. Replaced right hepatic artery. Poor opacification of the SMA, nondiagnostic evaluation of branches. Renals: 2 right renal arteries. Superior renal arteries severely stenotic and densely calcified at the origin. Lower renal artery is densely calcified and severely stenotic. Mildly stenotic left renal artery. Patient with end-stage renal disease. IMA: Patent but calcified. RIGHT Lower Extremity Inflow: Nondiagnostic assessment due to bolus timing and soft tissue attenuation from habitus. Densely calcified. Outflow: Essentially nondiagnostic assessment. Densely calcified. There is some opacification  of the common, superficial femoral and profundus arteries. No obvious occlusive disease, however more detailed assessment is limited. Runoff: Densely calcified.  Nondiagnostic assessment. LEFT Lower Extremity Inflow: Nondiagnostic assessment due to bolus timing and soft tissue attenuation from habitus. Densely calcified. Outflow: Essentially nondiagnostic assessment. Densely calcified. There is some opacification of the common, superficial femoral and profundus femoral arteries. No obvious occlusive disease, however more detailed assessment is limited. Runoff: Densely calcified.  Nondiagnostic assessment. Veins:  Venous system is unopacified and suboptimally assessed. Review of the MIP images confirms the above findings. NON-VASCULAR Lower chest: Trace bilateral pleural effusions. Cardiomegaly with coronary artery c

## 2021-08-04 NOTE — Progress Notes (Signed)
Patient being transferred to [facility] for continued care, transporting via carelink. Writer called report spoke wit accepting nurse [place] verbalized understanding of report provide. Patient' IV remain with heparin gtt infusing 16units/hr. Patient's belongings packed and sent with carelink and discharge packet given to carelink RN. ?

## 2021-08-04 NOTE — Progress Notes (Signed)
Reinforced teaching on heparin drip. Patient ok with administration. Heparin bolus administered. Awaiting IV by [place] to start continuous after losing IV access. Patient made decision to transfer to [facility] as discussed this morning with MD. MD made aware and initiated that process. Will continue to do frequent rounding's and reinforce education with patient. Call light within reach.  ?

## 2021-08-04 NOTE — Consult Note (Addendum)
[facility] ?Nephrology Consult Note: ?Reason for Consult: To manage dialysis and dialysis related needs ?Referring Physician: Dr. Shanon Brow, Raelene Bott ? ?HPI:  ?Courtney Gray is an 51 y.o. female with past medical history significant for DM, HTN, morbid obesity, COPD with chronic hypoxic respiratory failure on home oxygen, ESRD on HD TTS at [facility], recently discharged from rehab facility admitted for leg pain and safe placement, seen as a consultation for the management of ESRD. ?It sounds like the patient was discharged from the rehab facility after completing her days there.  She was sent home.  She reports having generalized body pain especially her right foot for several weeks.  She was placed on IV heparin however CT angio of aorta and femoral artery unremarkable except chronic calcification. ?The patient is hemodynamically stable, on oxygen which is her home dose. ?The labs showed potassium 3.7, BUN 57, creatinine 13.97, hemoglobin 7.8. ?Per patient her last dialysis was on Thursday.  She reports not able to complete full treatment because of leg pain. ?She is currently admitted under observation status. ? ?OP HD orders from recent hospitalization. ?Dialysis Orders:  [name]-  TTS - 4:45  EDW 165kg. ?HD Bath 1K/2.5 calc, Dialyzer rev300, Heparin load 1300- then 800 per hour. Access [facility] (refuses other ) -  400 BFR but refuses that high.  Micera 200 mcg IV every 2 weeks, calcitriol 1 mcg, sensipar 30 q treatm ? ?Past Medical History:  ?Diagnosis Date  ? Arthritis   ? Diabetes mellitus without complication ([place])   ? Dialysis complication, subsequent encounter   ? Gout   ? High cholesterol   ? Hypertension   ? Renal disorder   ? ? ?Past Surgical History:  ?Procedure Laterality Date  ? AV FISTULA PLACEMENT    ? x2, unsuccessful.   ? IR FLUORO GUIDE CV LINE LEFT  12/14/2020  ? IR FLUORO GUIDE CV LINE LEFT  02/20/2021  ? IR FLUORO GUIDE CV LINE RIGHT  08/28/2020  ? IR FLUORO GUIDE CV LINE RIGHT   05/13/2021  ? IR FLUORO GUIDE CV LINE RIGHT  05/24/2021  ? IR PERC TUN PERIT CATH WO PORT S&I /IMAG  09/06/2020  ? IR PTA VENOUS EXCEPT DIALYSIS CIRCUIT  02/20/2021  ? IR PTA VENOUS EXCEPT DIALYSIS CIRCUIT  05/13/2021  ? IR REMOVAL TUN CV CATH W/O FL  05/24/2021  ? IR US GUIDE VASC ACCESS LEFT  09/06/2020  ? IR US GUIDE VASC ACCESS RIGHT  05/24/2021  ? IR VENOCAVAGRAM SVC  02/20/2021  ? ? ?Family History  ?Problem Relation Age of Onset  ? Diabetes Mother   ? Diabetes Father   ? ? ?Social History:  reports that she has never smoked. She has never used smokeless tobacco. She reports that she does not drink alcohol and does not use drugs. ? ?Allergies:  ?Allergies  ?Allergen Reactions  ? Benzonatate Other (See Comments)  ?  Hallucinations  ? Hydralazine Other (See Comments)  ?  Hallucinations  ? Amoxicillin Nausea And Vomiting  ? Clonidine Other (See Comments)  ?  Altered mental state  ? Morphine Itching  ? Oxycodone Itching  ? ? ?Medications: I have reviewed the patient's current medications. ? ? ?Results for orders placed or performed during the hospital encounter of 08/03/21 (from the past 48 hour(s))  ?POC CBG, ED     Status: Abnormal  ? Collection Time: 08/03/21 11:41 AM  ?Result Value Ref Range  ? Glucose-Capillary 143 (H) 70 - 99 mg/dL  ?  Comment: Glucose reference range applies only to samples taken after fasting for at least 8 hours.  ?CBC with Differential     Status: Abnormal  ? Collection Time: 08/03/21 12:17 PM  ?Result Value Ref Range  ? WBC 10.0 4.0 - 10.5 K/uL  ? RBC 2.72 (L) 3.87 - 5.11 MIL/uL  ? Hemoglobin 7.8 (L) 12.0 - 15.0 g/dL  ? HCT 28.0 (L) 36.0 - 46.0 %  ? MCV 102.9 (H) 80.0 - 100.0 fL  ? MCH 28.7 26.0 - 34.0 pg  ? MCHC 27.9 (L) 30.0 - 36.0 g/dL  ? RDW 18.2 (H) 11.5 - 15.5 %  ? Platelets 212 150 - 400 K/uL  ? nRBC 0.0 0.0 - 0.2 %  ? Neutrophils Relative % 77 %  ? Neutro Abs 7.8 (H) 1.7 - 7.7 K/uL  ? Lymphocytes Relative 9 %  ? Lymphs Abs 0.9 0.7 - 4.0 K/uL  ? Monocytes Relative 10 %  ? Monocytes Absolute  1.0 0.1 - 1.0 K/uL  ? Eosinophils Relative 3 %  ? Eosinophils Absolute 0.3 0.0 - 0.5 K/uL  ? Basophils Relative 1 %  ? Basophils Absolute 0.1 0.0 - 0.1 K/uL  ? Immature Granulocytes 0 %  ? Abs Immature Granulocytes 0.03 0.00 - 0.07 K/uL  ?  Comment: Performed at [facility], [address], [place], [place] [address]  ?Comprehensive metabolic panel     Status: Abnormal  ? Collection Time: 08/03/21 12:17 PM  ?Result Value Ref Range  ? Sodium 139 135 - 145 mmol/L  ? Potassium 4.0 3.5 - 5.1 mmol/L  ? Chloride 99 98 - 111 mmol/L  ? CO2 24 22 - 32 mmol/L  ? Glucose, Bld 160 (H) 70 - 99 mg/dL  ?  Comment: Glucose reference range applies only to samples taken after fasting for at least 8 hours.  ? BUN 52 (H) 6 - 20 mg/dL  ? Creatinine, Ser 11.89 (H) 0.44 - 1.00 mg/dL  ? Calcium 7.9 (L) 8.9 - 10.3 mg/dL  ? Total Protein 7.2 6.5 - 8.1 g/dL  ? Albumin 3.1 (L) 3.5 - 5.0 g/dL  ? AST 17 15 - 41 U/L  ? ALT 15 0 - 44 U/L  ? Alkaline Phosphatase 61 38 - 126 U/L  ? Total Bilirubin 0.7 0.3 - 1.2 mg/dL  ? GFR, Estimated 3 (L) >60 mL/min  ?  Comment: (NOTE) ?Calculated using the CKD-EPI Creatinine Equation (2021) ?  ? Anion gap 16 (H) 5 - 15  ?  Comment: Performed at [facility], [address] [place], [place] [address]  ?CK     Status: None  ? Collection Time: 08/03/21 12:17 PM  ?Result Value Ref Range  ? Total CK 131 38 - 234 U/L  ?  Comment: Performed at [facility], [address] [place], [place] [address]  ?Hepatitis B surface antigen     Status: None  ? Collection Time: 08/03/21  4:06 PM  ?Result Value Ref Range  ? Hepatitis B Surface Ag NON REACTIVE NON REACTIVE  ?  Comment: Performed at [facility], [place] [address] [place], [place] [address]  ?Hepatitis B surface antibody     Status: None  ? Collection Time: 08/03/21  4:06 PM  ?Result Value Ref Range  ? Hep B S Ab NON REACTIVE NON REACTIVE  ?  Comment: (NOTE) ?Inconsistent with immunity, less than 10 mIU/mL. ? ?Performed at [facility], [place] [address]  [place], [place] ?[address] ?  ?Basic  metabolic panel     Status: Abnormal  ? Collection Time: 08/04/21  5:06 AM  ?Result Value Ref Range  ? Sodium 137 135 - 145 mmol/L  ? Potassium 3.7 3.5 - 5.1 mmol/L  ? Chloride 98 98 - 111 mmol/L  ? CO2 24 22 - 32 mmol/L  ? Glucose, Bld 108 (H) 70 - 99 mg/dL  ?  Comment: Glucose reference range applies only to samples taken after fasting for at least 8 hours.  ? BUN 57 (H) 6 - 20 mg/dL  ? Creatinine, Ser 13.97 (H) 0.44 - 1.00 mg/dL  ? Calcium 8.0 (L) 8.9 - 10.3 mg/dL  ? GFR, Estimated 3 (L) >60 mL/min  ?  Comment: (NOTE) ?Calculated using the CKD-EPI Creatinine Equation (2021) ?  ? Anion gap 15 5 - 15  ?  Comment: Performed at [facility], [address] [place], [place] [address]  ?CBC     Status: Abnormal  ? Collection Time: 08/04/21  5:06 AM  ?Result Value Ref Range  ? WBC 10.1 4.0 - 10.5 K/uL  ? RBC 2.81 (L) 3.87 - 5.11 MIL/uL  ? Hemoglobin 7.8 (L) 12.0 - 15.0 g/dL  ? HCT 28.2 (L) 36.0 - 46.0 %  ? MCV 100.4 (H) 80.0 - 100.0 fL  ? MCH 27.8 26.0 - 34.0 pg  ? MCHC 27.7 (L) 30.0 - 36.0 g/dL  ? RDW 18.0 (H) 11.5 - 15.5 %  ? Platelets 247 150 - 400 K/uL  ? nRBC 0.2 0.0 - 0.2 %  ?  Comment: Performed at [facility], [address], [place], [place] [address]  ?Heparin level (unfractionated)     Status: Abnormal  ? Collection Time: 08/04/21  5:06 AM  ?Result Value Ref Range  ? Heparin Unfractionated <0.10 (L) 0.30 - 0.70 IU/mL  ?  Comment: (NOTE) ?The clinical reportable range upper limit is being lowered to >1.10 ?to align with the FDA approved guidance for the current laboratory ?assay. ? ?If heparin results are below expected values, and patient dosage has  ?been confirmed, suggest follow up testing of antithrombin III levels. ?Performed at [facility], [address], [place],  [address] ?  ?Glucose, capillary     Status: Abnormal  ? Collection Time: 08/04/21  7:24 AM  ?Result Value Ref Range  ? Glucose-Capillary 100 (H) 70 - 99 mg/dL  ?  Comment: Glucose reference range  applies only to samples taken after fasting for at least 8 hours.  ? ? ?CT Angio Aortobifemoral W and/or Wo Contrast ? ?Result Date: 08/03/2021 ?CLINICAL DATA:  Provided history: Claudication or leg ischemia [place]

## 2021-08-05 ENCOUNTER — Encounter (HOSPITAL_COMMUNITY): Payer: 59

## 2021-08-05 ENCOUNTER — Encounter (HOSPITAL_COMMUNITY): Payer: Self-pay | Admitting: Family Medicine

## 2021-08-05 DIAGNOSIS — I96 Gangrene, not elsewhere classified: Secondary | ICD-10-CM | POA: Diagnosis not present

## 2021-08-05 DIAGNOSIS — N186 End stage renal disease: Secondary | ICD-10-CM | POA: Diagnosis not present

## 2021-08-05 DIAGNOSIS — E119 Type 2 diabetes mellitus without complications: Secondary | ICD-10-CM | POA: Diagnosis not present

## 2021-08-05 DIAGNOSIS — J9611 Chronic respiratory failure with hypoxia: Secondary | ICD-10-CM

## 2021-08-05 DIAGNOSIS — I10 Essential (primary) hypertension: Secondary | ICD-10-CM

## 2021-08-05 DIAGNOSIS — Z992 Dependence on renal dialysis: Secondary | ICD-10-CM | POA: Diagnosis not present

## 2021-08-05 DIAGNOSIS — M79673 Pain in unspecified foot: Secondary | ICD-10-CM | POA: Diagnosis not present

## 2021-08-05 DIAGNOSIS — I998 Other disorder of circulatory system: Secondary | ICD-10-CM

## 2021-08-05 LAB — CBC
HCT: 28 % — ABNORMAL LOW (ref 36.0–46.0)
Hemoglobin: 8 g/dL — ABNORMAL LOW (ref 12.0–15.0)
MCH: 28.7 pg (ref 26.0–34.0)
MCHC: 28.6 g/dL — ABNORMAL LOW (ref 30.0–36.0)
MCV: 100.4 fL — ABNORMAL HIGH (ref 80.0–100.0)
Platelets: 221 10*3/uL (ref 150–400)
RBC: 2.79 MIL/uL — ABNORMAL LOW (ref 3.87–5.11)
RDW: 18 % — ABNORMAL HIGH (ref 11.5–15.5)
WBC: 7 10*3/uL (ref 4.0–10.5)
nRBC: 0.3 % — ABNORMAL HIGH (ref 0.0–0.2)

## 2021-08-05 LAB — BASIC METABOLIC PANEL
Anion gap: 14 (ref 5–15)
BUN: 32 mg/dL — ABNORMAL HIGH (ref 6–20)
CO2: 25 mmol/L (ref 22–32)
Calcium: 8.2 mg/dL — ABNORMAL LOW (ref 8.9–10.3)
Chloride: 98 mmol/L (ref 98–111)
Creatinine, Ser: 10.76 mg/dL — ABNORMAL HIGH (ref 0.44–1.00)
GFR, Estimated: 4 mL/min — ABNORMAL LOW (ref >60)
Glucose, Bld: 109 mg/dL — ABNORMAL HIGH (ref 70–99)
Potassium: 3.8 mmol/L (ref 3.5–5.1)
Sodium: 137 mmol/L (ref 135–145)

## 2021-08-05 LAB — HEPATITIS B SURFACE ANTIBODY, QUANTITATIVE: Hep B S AB Quant (Post): <3.1 m[IU]/mL — ABNORMAL LOW (ref >9.9)

## 2021-08-05 LAB — HEPARIN LEVEL (UNFRACTIONATED)
Heparin Unfractionated: <0.1 IU/mL — ABNORMAL LOW (ref 0.30–0.70)
Heparin Unfractionated: 0.12 IU/mL — ABNORMAL LOW (ref 0.30–0.70)

## 2021-08-05 LAB — GLUCOSE, CAPILLARY
Glucose-Capillary: 116 mg/dL — ABNORMAL HIGH (ref 70–99)
Glucose-Capillary: 141 mg/dL — ABNORMAL HIGH (ref 70–99)
Glucose-Capillary: 121 mg/dL — ABNORMAL HIGH (ref 70–99)

## 2021-08-05 MED ORDER — ATORVASTATIN CALCIUM 10 MG PO TABS
10.0000 mg | ORAL_TABLET | Freq: Every day | ORAL | Status: DC
  Administered 2021-08-05 – 2021-08-27 (×21): 10 mg via ORAL
  Filled 2021-08-05 (×24): qty 1

## 2021-08-05 MED ORDER — NYSTATIN 100000 UNIT/GM EX POWD
Freq: Three times a day (TID) | CUTANEOUS | Status: DC
  Administered 2021-08-16 – 2021-08-22 (×2): 1 via TOPICAL
  Filled 2021-08-05 (×4): qty 15

## 2021-08-05 MED ORDER — CARVEDILOL 6.25 MG PO TABS
6.2500 mg | ORAL_TABLET | Freq: Two times a day (BID) | ORAL | Status: DC
  Administered 2021-08-06 – 2021-08-22 (×7): 6.25 mg via ORAL
  Filled 2021-08-05 (×25): qty 1

## 2021-08-05 MED ORDER — CALCITRIOL 0.25 MCG PO CAPS
1.0000 ug | ORAL_CAPSULE | ORAL | Status: DC
  Administered 2021-08-06 – 2021-08-27 (×6): 1 ug via ORAL
  Filled 2021-08-05 (×5): qty 4
  Filled 2021-08-05: qty 2
  Filled 2021-08-05 (×3): qty 4

## 2021-08-05 MED ORDER — SODIUM CHLORIDE 0.9% FLUSH: 10.0000 mL | INTRAVENOUS | Status: DC | PRN

## 2021-08-05 MED ORDER — ACETAMINOPHEN 500 MG PO TABS
1000.0000 mg | ORAL_TABLET | Freq: Four times a day (QID) | ORAL | Status: DC | PRN
  Administered 2021-08-10 – 2021-08-27 (×7): 1000 mg via ORAL
  Filled 2021-08-05 (×7): qty 2

## 2021-08-05 MED ORDER — DIPHENHYDRAMINE HCL 25 MG PO CAPS
25.0000 mg | ORAL_CAPSULE | Freq: Three times a day (TID) | ORAL | Status: DC | PRN
  Administered 2021-08-05 – 2021-08-06 (×2): 25 mg via ORAL
  Filled 2021-08-05 (×3): qty 1

## 2021-08-05 MED ORDER — HYDROCODONE-ACETAMINOPHEN 7.5-325 MG PO TABS
1.0000 | ORAL_TABLET | Freq: Four times a day (QID) | ORAL | Status: DC | PRN
  Administered 2021-08-05 – 2021-08-06 (×4): 1 via ORAL
  Filled 2021-08-05 (×5): qty 1

## 2021-08-05 MED ORDER — SODIUM CHLORIDE 0.9% FLUSH
10.0000 mL | Freq: Two times a day (BID) | INTRAVENOUS | Status: DC
  Administered 2021-08-08 – 2021-08-27 (×22): 10 mL

## 2021-08-05 MED ORDER — INSULIN ASPART 100 UNIT/ML IJ SOLN
0.0000 [IU] | Freq: Three times a day (TID) | INTRAMUSCULAR | Status: DC
  Administered 2021-08-12 – 2021-08-14 (×2): 1 [IU] via SUBCUTANEOUS
  Administered 2021-08-14: 3 [IU] via SUBCUTANEOUS
  Administered 2021-08-15: 2 [IU] via SUBCUTANEOUS
  Administered 2021-08-16: 1 [IU] via SUBCUTANEOUS
  Administered 2021-08-16: 3 [IU] via SUBCUTANEOUS
  Administered 2021-08-18: 2 [IU] via SUBCUTANEOUS
  Administered 2021-08-18 – 2021-08-28 (×4): 1 [IU] via SUBCUTANEOUS

## 2021-08-05 MED ORDER — CINACALCET HCL 30 MG PO TABS
30.0000 mg | ORAL_TABLET | ORAL | Status: DC
  Administered 2021-08-06 – 2021-08-13 (×4): 30 mg via ORAL
  Filled 2021-08-05 (×4): qty 1

## 2021-08-05 NOTE — Progress Notes (Signed)
ANTICOAGULATION CONSULT NOTE ? ?Pharmacy Consult for Heparin ?Indication:  ischemic limb ? ?Allergies  ?Allergen Reactions  ? Benzonatate Other (See Comments)  ?  Hallucinations  ? Hydralazine Other (See Comments)  ?  Hallucinations  ? Amoxicillin Nausea And Vomiting  ? Clonidine Other (See Comments)  ?  Altered mental state  ? Chlorhexidine Itching  ? Morphine Itching  ? Oxycodone Itching  ?  Pt tolerated hospital admission 07/2021  ? ? ?Patient Measurements: ?Height: 5\' 5"  (165.1 cm) ?Weight: (!) 168.2 kg (370 lb 13 oz) ?IBW/kg (Calculated) : 57 ?HEPARIN DW (KG): 100.3  ? ?Vital Signs: ?Temp: 98.2 ?F (36.8 ?C) (05/01 1631) ?Temp Source: Oral (05/01 3817) ?BP: 124/92 (05/01 1631) ?Pulse Rate: 60 (05/01 1631) ? ?Labs: ?Recent Labs  ?  08/03/21 ?1217 08/03/21 ?1217 08/04/21 ?7116 08/04/21 ?1714 08/05/21 ?5790 08/05/21 ?1430  ?HGB 7.8*  --  7.8*  --  8.0*  --   ?HCT 28.0*  --  28.2*  --  28.0*  --   ?PLT 212  --  247  --  221  --   ?HEPARINUNFRC  --    < > <0.10* 0.57 <0.10* 0.12*  ?CREATININE 11.89*  --  13.97*  --  10.76*  --   ?CKTOTAL 131  --   --   --   --   --   ? < > = values in this interval not displayed.  ? ? ? ?Estimated Creatinine Clearance: 9.9 mL/min (A) (by C-G formula based on SCr of 10.76 mg/dL (H)). ? ? ?Medical History: ?Past Medical History:  ?Diagnosis Date  ? Arthritis   ? Diabetes mellitus without complication ([place])   ? Dialysis complication, subsequent encounter   ? Gout   ? High cholesterol   ? Hypertension   ? Renal disorder   ? ? ?Medications:  ?See med rec ? ?Assessment: ?51 yo female presents to ED with bilateral foot pain. Patient  with ESRD and on HD T,Th and Sat. Patient states earlier this week she started having bilateral foot pain similar to gout versus neuropathy. Its burning, worse with ambulation, and has been hurting since January 2023. Patient is not on oral anticoagulants. Pharmacy consulted to start heparin for concerns of ischemic leg.  ? ?Heparin came back low after drawn out  of HD cath. Will increase rate then re-draw from HD cath again by IV team.  ? ?Goal of Therapy:  ?Heparin level 0.3-0.7 units/ml ?Monitor platelets by anticoagulation protocol: Yes ?  ?Plan:  ?Increase heparin infusion 1900 units/hr  ?Check heparin level when able and plan daily while on heparin ?Continue to monitor H&H and platelets ? ? ?Onnie Boer, PharmD, BCIDP, AAHIVP, CPP ?Infectious Disease Pharmacist ?08/05/2021 5:56 PM ? ? ? ?

## 2021-08-05 NOTE — Progress Notes (Signed)
Advised of pt's admission at progression this am. Pt is for snf placement per chart. Pt receives out-pt HD at [facility] on TTS. Pt needs to arrive at 10:15 for 10:30 chair time. Will assist as needed.  ? ?Melven Sartorius ?Renal Navigator ?[phone] ?

## 2021-08-05 NOTE — Progress Notes (Signed)
NEW ADMISSION NOTE ?New Admission Note:  ?  ?Arrival Method: Stretcher ?Mental Orientation: A&O X 4 ?Telemetry:# 18 ?Assessment: Completed ?Skin: Redness to lower abdominal folds, Rt necrotic toes, rt finger pain. ?IV: Rt UA fistula, Rt IJ. ?Pain: 8/10, toes, fingers ?Tubes: None ?Safety Measures: Reviewed with patient.  ?Admission: Initiated ?[facility] Orientation: Patient has been oriented to the room, unit and staff.  ?Family: spoke with pt on the phone ?  ?Orders have been reviewed and implemented. Will continue to monitor the patient. Call light has been placed within reach and bed alarm has been activated.  ?

## 2021-08-05 NOTE — Progress Notes (Signed)
ANTICOAGULATION CONSULT NOTE ? ?Pharmacy Consult for Heparin ?Indication:  ischemic limb ? ?Allergies  ?Allergen Reactions  ? Benzonatate Other (See Comments)  ?  Hallucinations  ? Hydralazine Other (See Comments)  ?  Hallucinations  ? Amoxicillin Nausea And Vomiting  ? Clonidine Other (See Comments)  ?  Altered mental state  ? Chlorhexidine Itching  ? Morphine Itching  ? Oxycodone Itching  ?  Pt tolerated hospital admission 07/2021  ? ? ?Patient Measurements: ?Height: 5\' 5"  (165.1 cm) ?Weight: (!) 168.2 kg (370 lb 13 oz) ?IBW/kg (Calculated) : 57 ?HEPARIN DW (KG): 100.3  ? ?Vital Signs: ?Temp: 98.1 ?F (36.7 ?C) (05/01 4268) ?Temp Source: Oral (05/01 3419) ?BP: 145/54 (05/01 0905) ?Pulse Rate: 94 (05/01 0905) ? ?Labs: ?Recent Labs  ?  08/03/21 ?1217 08/04/21 ?6222 08/04/21 ?1714 08/05/21 ?9798  ?HGB 7.8* 7.8*  --  8.0*  ?HCT 28.0* 28.2*  --  28.0*  ?PLT 212 247  --  221  ?HEPARINUNFRC  --  <0.10* 0.57 <0.10*  ?CREATININE 11.89* 13.97*  --  10.76*  ?CKTOTAL 131  --   --   --   ? ? ? ?Estimated Creatinine Clearance: 9.9 mL/min (A) (by C-G formula based on SCr of 10.76 mg/dL (H)). ? ? ?Medical History: ?Past Medical History:  ?Diagnosis Date  ? Arthritis   ? Diabetes mellitus without complication ([place])   ? Dialysis complication, subsequent encounter   ? Gout   ? High cholesterol   ? Hypertension   ? Renal disorder   ? ? ?Medications:  ?See med rec ? ?Assessment: ?51 yo female presents to ED with bilateral foot pain. Patient  with ESRD and on HD T,Th and Sat. Patient states earlier this week she started having bilateral foot pain similar to gout versus neuropathy. Its burning, worse with ambulation, and has been hurting since January 2023. Patient is not on oral anticoagulants. Pharmacy consulted to start heparin for concerns of ischemic leg.  ? ?No issues with infusion per RN. Unable to obtain repeat heparin level this morning. Have consulted IV team. Was previously therapeutic on this rate, prior to transfer. Hgb/Plt  stable. Plan for HD today. ? ?Goal of Therapy:  ?Heparin level 0.3-0.7 units/ml ?Monitor platelets by anticoagulation protocol: Yes ?  ?Plan:  ?Continue heparin infusion at 1600 units/hr for now ?Check heparin level when able and plan daily while on heparin ?Continue to monitor H&H and platelets ? ? ? ?Thank you for allowing [place] to participate in this patients care. ?Jens Som, PharmD ?08/05/2021 11:28 AM ? ?**Pharmacist phone directory can be found on [email or web address] listed under [place]** ? ? ?

## 2021-08-05 NOTE — Progress Notes (Signed)
[facility] PROGRESS NOTE   Courtney Gray:[id number] DOB: Oct 09, 1970 DOA: 08/03/2021  PCP: [facility]  Brief History/Interval Summary: 51 y.o. female with medical history significant of end-stage renal disease on dialysis Tuesday Thursday Saturday, gout, diabetes, hypertension just discharged from rehab this past Monday was told that she ran out of her days and that she needed to pay out-of-pocket for any further rehab. She was sent home.  Patient is a poor historian but presented mainly for pain in her right foot.  There was concern for ischemia.  Patient was hospitalized for further management.    Consultants: Vascular surgery.  Nephrology  Procedures: Hemodialysis    Subjective/Interval History: Patient is a poor historian.  Complains of some pain in the right foot.  She thinks she has an infection in her toes.  She was explained that there is concern for vascular compromise.  Does not seem to fully understand this.    Assessment/Plan:  Ischemic right foot There is evidence for gangrenous changes in some of her toes of the right foot.  Underwent CT angiogram which was inconclusive due to significant calcification.  Case was discussed with vascular surgery and the patient was transferred here.  She will likely need to undergo arteriogram.  Await vascular surgery input.  Remains on IV heparin.  End-stage renal disease Apparently missed her dialysis last week.  Nephrology is following.  Was dialyzed yesterday.  Chronic respiratory failure with hypoxia Chronically on 3 L of oxygen by nasal cannula.  Respiratory status seems to be stable.  Diabetes mellitus type 2 with renal complications, ESRD It looks like she had episodes of hypoglycemia during previous hospitalization.  HbA1c was 5.7.  Her glipizide and glargine were discontinued.  She required D10 infusion at that time. We will monitor CBGs.  Very sensitive SSI.  Essential hypertension Currently not  on any antihypertensives.  Was apparently on amlodipine and carvedilol previously.  Blood pressure noted to be elevated. Resume her home medications.  Anemia of chronic kidney disease Hemoglobin is low but stable.  History of gout Recently had gout flare treated with steroids.  History of hemorrhoids Stable.  Hyperlipidemia Supposed to be on statin prior to admission.  Will resume.  Morbid obesity Estimated body mass index is 61.71 kg/m as calculated from the following:   Height as of this encounter: 5\' 5"  (1.651 m).   Weight as of this encounter: 168.2 kg.   DVT Prophylaxis: On IV heparin Code Status: Full code Family Communication: Discussed with patient Disposition Plan: To be determined  Status is: Inpatient Remains inpatient appropriate because: Concern for ischemia involving right lower extremity      Medications: Scheduled:  aspirin EC  81 mg Oral Q breakfast   calcium carbonate  1,500 mg Oral TID with meals   calcium carbonate  1.5 tablet Oral With snacks   Chlorhexidine Gluconate Cloth  6 each Topical Q0600   docusate sodium  100 mg Oral BID   gabapentin  100 mg Oral TID   [START ON 08/06/2021] gabapentin  100 mg Oral Q T,Th,Sa-HD   neomycin-bacitracin-polymyxin   Topical BID   nystatin   Topical TID   pantoprazole  40 mg Oral Daily   sodium chloride flush  3 mL Intravenous Q12H   Continuous:  sodium chloride     sodium chloride     sodium chloride     heparin 1,600 Units/hr (08/04/21 1034)   WJX:BJYNWG chloride, sodium chloride, sodium chloride, acetaminophen, alteplase, diphenhydrAMINE, heparin, lidocaine (PF),  lidocaine-prilocaine, loperamide, nitroGLYCERIN, oxyCODONE, pentafluoroprop-tetrafluoroeth, sodium chloride flush  Antibiotics: Anti-infectives (From admission, onward)    None       Objective:  Vital Signs  Vitals:   08/04/21 2131 08/04/21 2348 08/05/21 0432 08/05/21 0905  BP: (!) 147/64 (!) 154/92 (!) 166/86 (!) 145/54  Pulse: 94  (!) 102 94 94  Resp: 20 19 20 19   Temp: 98.4 F (36.9 C) 98.8 F (37.1 C) 97.8 F (36.6 C) 98.1 F (36.7 C)  TempSrc: Oral Oral Oral Oral  SpO2: 100% 95% 94% 99%  Weight:  (!) 168.2 kg    Height:  5\' 5"  (1.651 m)      Intake/Output Summary (Last 24 hours) at 08/05/2021 1114 Last data filed at 08/05/2021 0800 Gross per 24 hour  Intake 715.96 ml  Output 2594 ml  Net -1878.04 ml   Filed Weights   08/04/21 1615 08/04/21 2030 08/04/21 2348  Weight: (!) 173.3 kg (!) 170.8 kg (!) 168.2 kg    General appearance: Awake alert.  In no distress Resp: Clear to auscultation bilaterally.  Normal effort Cardio: S1-S2 is normal regular.  No S3-S4.  No rubs murmurs or bruit GI: Abdomen is soft.  Nontender nondistended.  Bowel sounds are present normal.  No masses organomegaly Extremities: Gangrenous appearing toes on the right foot noted.  Cool to touch.  Pulses not palpable. Neurologic:  No focal neurological deficits.    Lab Results:  Data Reviewed: I have personally reviewed following labs and reports of the imaging studies  CBC: Recent Labs  Lab 08/03/21 1217 08/04/21 0506 08/05/21 0651  WBC 10.0 10.1 7.0  NEUTROABS 7.8*  --   --   HGB 7.8* 7.8* 8.0*  HCT 28.0* 28.2* 28.0*  MCV 102.9* 100.4* 100.4*  PLT 212 247 221    Basic Metabolic Panel: Recent Labs  Lab 08/03/21 1217 08/04/21 0506 08/05/21 0651  NA 139 137 137  K 4.0 3.7 3.8  CL 99 98 98  CO2 24 24 25   GLUCOSE 160* 108* 109*  BUN 52* 57* 32*  CREATININE 11.89* 13.97* 10.76*  CALCIUM 7.9* 8.0* 8.2*  PHOS  --  6.5*  --     GFR: Estimated Creatinine Clearance: 9.9 mL/min (A) (by C-G formula based on SCr of 10.76 mg/dL (H)).  Liver Function Tests: Recent Labs  Lab 08/03/21 1217  AST 17  ALT 15  ALKPHOS 61  BILITOT 0.7  PROT 7.2  ALBUMIN 3.1*     Cardiac Enzymes: Recent Labs  Lab 08/03/21 1217  CKTOTAL 131    CBG: Recent Labs  Lab 08/03/21 1141 08/04/21 0724 08/04/21 1948  GLUCAP 143* 100*  110*     Radiology Studies: CT Angio Aortobifemoral W and/or Wo Contrast  Result Date: 08/03/2021 CLINICAL DATA:  Provided history: Claudication or leg ischemia Peripheral arterial disease (PAD), asymptomatic EXAM: CT ANGIOGRAPHY OF ABDOMINAL AORTA WITH ILIOFEMORAL RUNOFF TECHNIQUE: Multidetector CT imaging of the abdomen, pelvis and lower extremities was performed using the standard protocol during bolus administration of intravenous contrast. Multiplanar CT image reconstructions and MIPs were obtained to evaluate the vascular anatomy. RADIATION DOSE REDUCTION: This exam was performed according to the departmental dose-optimization program which includes automated exposure control, adjustment of the mA and/or kV according to patient size and/or use of iterative reconstruction technique. CONTRAST:  OMNIPAQUE IOHEXOL 350 MG/ML SOLN COMPARISON:  None. FINDINGS: VASCULAR There is poor arterial opacification, examination is marginally diagnostic best. Aorta: Aortic atherosclerosis. No aortic aneurysm. No severe stenosis. Celiac: Densely calcified throughout  its course.  No acute findings. SMA: Densely calcified. Replaced right hepatic artery. Poor opacification of the SMA, nondiagnostic evaluation of branches. Renals: 2 right renal arteries. Superior renal arteries severely stenotic and densely calcified at the origin. Lower renal artery is densely calcified and severely stenotic. Mildly stenotic left renal artery. Patient with end-stage renal disease. IMA: Patent but calcified. RIGHT Lower Extremity Inflow: Nondiagnostic assessment due to bolus timing and soft tissue attenuation from habitus. Densely calcified. Outflow: Essentially nondiagnostic assessment. Densely calcified. There is some opacification of the common, superficial femoral and profundus arteries. No obvious occlusive disease, however more detailed assessment is limited. Runoff: Densely calcified.  Nondiagnostic assessment. LEFT Lower  Extremity Inflow: Nondiagnostic assessment due to bolus timing and soft tissue attenuation from habitus. Densely calcified. Outflow: Essentially nondiagnostic assessment. Densely calcified. There is some opacification of the common, superficial femoral and profundus femoral arteries. No obvious occlusive disease, however more detailed assessment is limited. Runoff: Densely calcified.  Nondiagnostic assessment. Veins: Venous system is unopacified and suboptimally assessed. Review of the MIP images confirms the above findings. NON-VASCULAR Lower chest: Trace bilateral pleural effusions. Cardiomegaly with coronary artery calcifications. Hepatobiliary: The liver is enlarged. Suspected hepatic steatosis. No evidence of focal lesion on this arterial phase exam. Gallbladder physiologically distended, no calcified stone. No biliary dilatation. Pancreas: No ductal dilatation or inflammation. Spleen: Normal in size without focal abnormality. Adrenals/Urinary Tract: Normal adrenal glands. Marked bilateral renal parenchymal atrophy consistent end-stage renal disease. There is symmetric perinephric edema. Empty bladder. Stomach/Bowel: Nondistended stomach. No obvious bowel obstruction or inflammation. Sigmoid diverticulosis without diverticulitis. Lymphatic: There is no bulky adenopathy, although assessment is limited by soft tissue attenuation from habitus. Reproductive: Uterine vascular calcifications, otherwise unremarkable. No adnexal mass. Other: No ascites.  Subcutaneous edema of the abdominal pannus. Musculoskeletal: Subcutaneous edema of the left lower extremity more prominent distally, however the entire extremity is involved. Subcutaneous edema of the right lower extremity from the calf through the foot. There is scattered soft tissue calcifications in the subcutaneous tissue is typical of end-stage renal disease. Please note that assessment of the lower extremity osseous structures is limited due to wide field-of-view  imaging. No obvious acute osseous findings. Bilateral knee osteoarthritis. IMPRESSION: VASCULAR 1. Essentially nondiagnostic CTA. The abdominal aorta and all branches are densely calcified. Contrast bolus timing is poor, and soft tissue attenuation from habitus obscures assessment of the iliac arteries and outflow. 2. Flow is demonstrated within both superficial femoral arteries, without obvious occlusive disease. The arteries are densely calcified and there is poor contrast opacification limiting further assessment. Nondiagnostic assessment below the knees. 3. As clinically indicated, catheter angiography is recommended for further assessment. NON-VASCULAR 1. Enlarged liver with suspected steatosis. 2. Cardiomegaly. 3. Sigmoid diverticulosis without diverticulitis. Electronically Signed   By: Narda Rutherford M.D.   On: 08/03/2021 19:12       LOS: 1 day   Katasha Riga  [facility] Pager on [email or web address]  08/05/2021, 11:14 AM

## 2021-08-05 NOTE — Progress Notes (Addendum)
Spoke with Kenney Houseman, pharmacist and explained that MD order is needed for HD cath access for labs. Explained that PIV cannot be placed for labs and since she'll need them regularly for heparin level monitoring that central access is recommended. HD cath does not have a pigtail (2 lumens).  ? ?VAT consult placed for PIV for labs. VAT RN messaged primary RN and informed her that team does not place PIV's for labs. If HD cath to be accessed, MD order needed.  ?Naleigha Raimondi Lorita Officer, RN ? ?

## 2021-08-05 NOTE — Progress Notes (Signed)
Called and notified pharmacy that Heparin level was drawn from HD catheter site per MD order. Fran Lowes, RN VAST ?

## 2021-08-05 NOTE — Plan of Care (Signed)
  Problem: Education: Goal: Knowledge of disease and its progression will improve Outcome: Progressing   

## 2021-08-05 NOTE — Progress Notes (Signed)
[facility] ?Progress Note ? ?Subjective: pt seen in room.  ? ?Vitals:  ? 08/04/21 2131 08/04/21 2348 08/05/21 0432 08/05/21 0905  ?BP: (!) 147/64 (!) 154/92 (!) 166/86 (!) 145/54  ?Pulse: 94 (!) 102 94 94  ?Resp: 20 19 20 19   ?Temp: 98.4 ?F (36.9 ?C) 98.8 ?F (37.1 ?C) 97.8 ?F (36.6 ?C) 98.1 ?F (36.7 ?C)  ?TempSrc: Oral Oral Oral Oral  ?SpO2: 100% 95% 94% 99%  ?Weight:  (!) 168.2 kg    ?Height:  5\' 5"  (1.651 m)    ? ? ?Exam: ?Gen alert, no distress ?No rash, cyanosis or gangrene ?Sclera anicteric, throat clear  ?No jvd or bruits ?Chest clear bilat to bases, no rales/ wheezing ?RRR no RG ?Abd soft obese ntnd no mass or ascites +bs ?GU defer ?MS no joint effusions or deformity ?Ext trace pretib edema, 2 toes on R foot w/ gangrenous changes, skin sloughing ?Neuro is alert, Ox 3 , nf ? RIJ TDC ? ? ? Home meds: asa, lipitor, tums, coreg 25 bid, neurontin, oxy IR, protonix, norvasc 10, prns/ vits/ supps ? ? ? OP HD: [name] ? 4h 76min  165kg  1K/2.5Ca bath  TDC  400 BFR ? - 4/29 > HBsAg neg w/ Abs < 10 ? - mircera 200ug q2 ? - calcitriol 1 ug tiw ? - sensipar 30 mg tiw ? ? ?Assessment/ Plan: ?Bilateral foot pain - w/ ischemic changes R foot+ gangrenous toes x 2. VVS consulted, planning for arteriogram.  ?ESRD - on HD TTS.  Had HD here yesterday 4/30. Next HD tomorrow ?Volume - bp's slightly high, no sig edema on exam. No resp issues, lungs clear. Plan 2.5 UFG w/ next HD.  ?HTN - home meds per pmd as needed.  ?Anemia ckd - Hb 7- 9 here. Gets esa at OP unit.  ?MBD ckd - CCa in range and phos up a bit. Cont sensipar and vdra w/ HD tiw. Not sure if on binders.  ? ? ? ? ?Rob Doctor, hospital ?08/05/2021, 12:30 PM ? ? ?Recent Labs  ?Lab 08/03/21 ?1217 08/04/21 ?9629 08/05/21 ?5284  ?HGB 7.8* 7.8* 8.0*  ?ALBUMIN 3.1*  --   --   ?CALCIUM 7.9* 8.0* 8.2*  ?PHOS  --  6.5*  --   ?CREATININE 11.89* 13.97* 10.76*  ?K 4.0 3.7 3.8  ? ?Inpatient medications: ? aspirin EC  81 mg Oral Q breakfast  ? atorvastatin  10 mg Oral QHS   ? calcium carbonate  1,500 mg Oral TID with meals  ? calcium carbonate  1.5 tablet Oral With snacks  ? carvedilol  6.25 mg Oral BID WC  ? Chlorhexidine Gluconate Cloth  6 each Topical Q0600  ? docusate sodium  100 mg Oral BID  ? gabapentin  100 mg Oral TID  ? [START ON 08/06/2021] gabapentin  100 mg Oral Q T,Th,Sa-HD  ? insulin aspart  0-6 Units Subcutaneous TID WC  ? neomycin-bacitracin-polymyxin   Topical BID  ? nystatin   Topical TID  ? pantoprazole  40 mg Oral Daily  ? sodium chloride flush  10-40 mL Intracatheter Q12H  ? sodium chloride flush  3 mL Intravenous Q12H  ? ? sodium chloride    ? sodium chloride    ? sodium chloride    ? heparin 1,600 Units/hr (08/04/21 1034)  ? ?sodium chloride, sodium chloride, sodium chloride, acetaminophen, alteplase, diphenhydrAMINE, heparin, lidocaine (PF), lidocaine-prilocaine, loperamide, nitroGLYCERIN, oxyCODONE, pentafluoroprop-tetrafluoroeth, sodium chloride flush, sodium chloride flush ? ? ? ? ? ? ?
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
## 2021-08-06 ENCOUNTER — Inpatient Hospital Stay (HOSPITAL_COMMUNITY): Payer: 59

## 2021-08-06 DIAGNOSIS — M79673 Pain in unspecified foot: Secondary | ICD-10-CM | POA: Diagnosis not present

## 2021-08-06 DIAGNOSIS — I998 Other disorder of circulatory system: Secondary | ICD-10-CM | POA: Diagnosis not present

## 2021-08-06 DIAGNOSIS — I96 Gangrene, not elsewhere classified: Secondary | ICD-10-CM | POA: Diagnosis not present

## 2021-08-06 DIAGNOSIS — J9611 Chronic respiratory failure with hypoxia: Secondary | ICD-10-CM | POA: Diagnosis not present

## 2021-08-06 DIAGNOSIS — I10 Essential (primary) hypertension: Secondary | ICD-10-CM | POA: Diagnosis not present

## 2021-08-06 LAB — CBC
HCT: 28.7 % — ABNORMAL LOW (ref 36.0–46.0)
Hemoglobin: 7.8 g/dL — ABNORMAL LOW (ref 12.0–15.0)
MCH: 27.8 pg (ref 26.0–34.0)
MCHC: 27.2 g/dL — ABNORMAL LOW (ref 30.0–36.0)
MCV: 102.1 fL — ABNORMAL HIGH (ref 80.0–100.0)
Platelets: 178 10*3/uL (ref 150–400)
RBC: 2.81 MIL/uL — ABNORMAL LOW (ref 3.87–5.11)
RDW: 17.8 % — ABNORMAL HIGH (ref 11.5–15.5)
WBC: 8.5 10*3/uL (ref 4.0–10.5)
nRBC: 0.7 % — ABNORMAL HIGH (ref 0.0–0.2)

## 2021-08-06 LAB — BASIC METABOLIC PANEL
Anion gap: 12 (ref 5–15)
BUN: 19 mg/dL (ref 6–20)
CO2: 27 mmol/L (ref 22–32)
Calcium: 8.1 mg/dL — ABNORMAL LOW (ref 8.9–10.3)
Chloride: 99 mmol/L (ref 98–111)
Creatinine, Ser: 8.49 mg/dL — ABNORMAL HIGH (ref 0.44–1.00)
GFR, Estimated: 5 mL/min — ABNORMAL LOW (ref >60)
Glucose, Bld: 72 mg/dL (ref 70–99)
Potassium: 3.9 mmol/L (ref 3.5–5.1)
Sodium: 138 mmol/L (ref 135–145)

## 2021-08-06 LAB — GLUCOSE, CAPILLARY
Glucose-Capillary: 92 mg/dL (ref 70–99)
Glucose-Capillary: 85 mg/dL (ref 70–99)
Glucose-Capillary: 69 mg/dL — ABNORMAL LOW (ref 70–99)
Glucose-Capillary: 72 mg/dL (ref 70–99)
Glucose-Capillary: 77 mg/dL (ref 70–99)

## 2021-08-06 LAB — HEPARIN LEVEL (UNFRACTIONATED)
Heparin Unfractionated: <0.1 IU/mL — ABNORMAL LOW (ref 0.30–0.70)
Heparin Unfractionated: 0.12 IU/mL — ABNORMAL LOW (ref 0.30–0.70)

## 2021-08-06 MED ORDER — HYDROCODONE-ACETAMINOPHEN 7.5-325 MG PO TABS: 1.0000 | ORAL_TABLET | Freq: Once | ORAL | Status: DC

## 2021-08-06 MED ORDER — GABAPENTIN 100 MG PO CAPS: 100.0000 mg | ORAL_CAPSULE | Freq: Two times a day (BID) | ORAL | Status: DC

## 2021-08-06 MED ORDER — HYDROMORPHONE HCL 1 MG/ML IJ SOLN
0.5000 mg | INTRAMUSCULAR | Status: DC | PRN
  Administered 2021-08-06 – 2021-08-14 (×32): 0.5 mg via INTRAVENOUS
  Filled 2021-08-06: qty 1
  Filled 2021-08-06 (×7): qty 0.5
  Filled 2021-08-06: qty 1
  Filled 2021-08-06 (×23): qty 0.5
  Filled 2021-08-06: qty 1
  Filled 2021-08-06 (×2): qty 0.5

## 2021-08-06 MED ORDER — HYDROXYZINE HCL 25 MG PO TABS
25.0000 mg | ORAL_TABLET | Freq: Three times a day (TID) | ORAL | Status: DC | PRN
  Administered 2021-08-06 – 2021-08-23 (×14): 25 mg via ORAL
  Filled 2021-08-06 (×14): qty 1

## 2021-08-06 MED ORDER — HEPARIN SODIUM (PORCINE) 1000 UNIT/ML IJ SOLN
1.9000 mL | Freq: Once | INTRAMUSCULAR | Status: AC
  Administered 2021-08-06: 1900 [IU] via INTRAVENOUS

## 2021-08-06 MED ORDER — HEPARIN BOLUS VIA INFUSION
2000.0000 [IU] | Freq: Once | INTRAVENOUS | Status: AC
Start: 2021-08-06 — End: 2021-08-06
  Administered 2021-08-06: 2000 [IU] via INTRAVENOUS
  Filled 2021-08-06: qty 2000

## 2021-08-06 MED ORDER — GABAPENTIN 100 MG PO CAPS
100.0000 mg | ORAL_CAPSULE | Freq: Two times a day (BID) | ORAL | Status: DC
  Administered 2021-08-07 – 2021-08-28 (×41): 100 mg via ORAL
  Filled 2021-08-06 (×42): qty 1

## 2021-08-06 MED ORDER — HEPARIN BOLUS VIA INFUSION
2500.0000 [IU] | Freq: Once | INTRAVENOUS | Status: AC
  Administered 2021-08-06: 2500 [IU] via INTRAVENOUS
  Filled 2021-08-06: qty 2500

## 2021-08-06 MED ORDER — TRAMADOL HCL 50 MG PO TABS
50.0000 mg | ORAL_TABLET | Freq: Four times a day (QID) | ORAL | Status: DC | PRN
  Administered 2021-08-06 – 2021-08-16 (×21): 50 mg via ORAL
  Filled 2021-08-06 (×22): qty 1

## 2021-08-06 NOTE — Progress Notes (Signed)
ANTICOAGULATION CONSULT NOTE ? ?Pharmacy Consult for Heparin ?Indication:  ischemic limb ? ?Allergies  ?Allergen Reactions  ? Benzonatate Other (See Comments)  ?  Hallucinations  ? Hydralazine Other (See Comments)  ?  Hallucinations  ? Amoxicillin Nausea And Vomiting  ? Clonidine Other (See Comments)  ?  Altered mental state  ? Chlorhexidine Itching  ? Morphine Itching  ? Oxycodone Itching  ?  Pt tolerated hospital admission 07/2021  ? ? ?Patient Measurements: ?Height: 5\' 5"  (165.1 cm) ?Weight: (Abnormal) 168.2 kg (370 lb 13 oz) ?IBW/kg (Calculated) : 57 ?HEPARIN DW (KG): 100.3  ? ?Vital Signs: ?Temp: 98.2 ?F (36.8 ?C) (05/01 2054) ?BP: 124/63 (05/01 2054) ?Pulse Rate: 84 (05/01 2054) ? ?Labs: ?Recent Labs  ?  08/03/21 ?1217 08/04/21 ?6578 08/04/21 ?1714 08/05/21 ?4696 08/05/21 ?1430 08/06/21 ?0428  ?HGB 7.8* 7.8*  --  8.0*  --   --   ?HCT 28.0* 28.2*  --  28.0*  --   --   ?PLT 212 247  --  221  --   --   ?HEPARINUNFRC  --  <0.10*   < > <0.10* 0.12* <0.10*  ?CREATININE 11.89* 13.97*  --  10.76*  --   --   ?CKTOTAL 131  --   --   --   --   --   ? < > = values in this interval not displayed.  ? ? ? ?Estimated Creatinine Clearance: 9.9 mL/min (A) (by C-G formula based on SCr of 10.76 mg/dL (H)). ? ? ?Medical History: ?Past Medical History:  ?Diagnosis Date  ? Arthritis   ? Diabetes mellitus without complication ([place])   ? Dialysis complication, subsequent encounter   ? Gout   ? High cholesterol   ? Hypertension   ? Renal disorder   ? ? ?Assessment: ?51 yo female presents to ED with bilateral foot pain. Patient  with ESRD and on HD T,Th and Sat. Patient states earlier this week she started having bilateral foot pain similar to gout versus neuropathy. Its burning, worse with ambulation, and has been hurting since January 2023. Patient is not on oral anticoagulants. Pharmacy consulted to start heparin for concerns of ischemic leg.  ? ?Heparin level undetectable: <0.10 after rate increase, RN reports labs collected  peripherally from right arm; suspect the therapeutic level from 4/30 was a result of previous bolus. Per RN no issues with infusion or s/sx of bleeding. CBC stable, Hgb remains low 8.0. ? ?Goal of Therapy:  ?Heparin level 0.3-0.7 units/ml ?Monitor platelets by anticoagulation protocol: Yes ?  ?Plan:  ?Bolus IV heparin 2000 units ?Increase heparin infusion 2200 units/hr  ?Check heparin level when able and plan daily while on heparin ?Continue to monitor H&H and platelets ? ? ?Georga Bora, PharmD ?Clinical Pharmacist ?08/06/2021 5:44 AM ?Please check AMION for all [place] numbers ? ? ? ?

## 2021-08-06 NOTE — Plan of Care (Signed)
?  Problem: Education: ?Goal: Knowledge of General Education information will improve ?Description: Including pain rating scale, medication(s)/side effects and non-pharmacologic comfort measures ?Outcome: Completed/Met ?  ?

## 2021-08-06 NOTE — Progress Notes (Signed)
? ?[facility] ?PROGRESS NOTE ? ? ?Courtney Gray YWV:[id number] DOB: 1970-08-28 DOA: 08/03/2021 ? ?PCP: [facility], [name] ? ?Brief History/Interval Summary: 51 y.o. female with medical history significant of end-stage renal disease on dialysis Tuesday Thursday Saturday, gout, diabetes, hypertension just discharged from rehab this past Monday was told that she ran out of her days and that she needed to pay out-of-pocket for any further rehab. She was sent home.  Patient is a poor historian but presented mainly for pain in her right foot.  There was concern for ischemia.  Patient was hospitalized for further management.   ? ?Consultants: Vascular surgery.  Nephrology.  Podiatry ? ?Procedures: Hemodialysis ? ? ? ?Subjective/Interval History: ?Patient seen at hemodialysis.  Wants her pain medication regimen to be modified.  Currently 8 out of 10 pain in the right foot.  Denies any shortness of breath.  Complaining of excessive somnolence.   ? ? ?Assessment/Plan: ? ?Ischemic right foot ?There is evidence for gangrenous changes in some of her toes of the right foot.  Underwent CT angiogram which was inconclusive due to significant calcification.  Case was discussed with vascular surgery and the patient was transferred here. ?Vascular surgery plans arteriogram tomorrow.  ABIs pending.  Remains on IV heparin. ?Pain medication regimen adjusted. ?Vascular surgery is recommending podiatry input as she may need amputation of toes.  Will consult podiatry. ? ?End-stage renal disease, Tuesday Thursday Saturday schedule ?Apparently missed her dialysis last week.  Nephrology is following.  ? ?Chronic respiratory failure with hypoxia ?Chronically on 3 L of oxygen by nasal cannula.  Respiratory status seems to be stable. ? ?Diabetes mellitus type 2 with renal complications, ESRD ?It looks like she had episodes of hypoglycemia during previous hospitalization.  HbA1c was 5.7.  Her glipizide and glargine were discontinued.   She required D10 infusion at that time. ?We will monitor CBGs.  Very sensitive SSI. ? ?Essential hypertension ?Currently not on any antihypertensives.  Was apparently on amlodipine and carvedilol previously.  Carvedilol was resumed at a lower dose.  Continue to monitor for now.  May need to resume amlodipine as well. ? ?Anemia of chronic kidney disease ?Hemoglobin is low but stable. ? ?Questionable history of gout ?Apparently treated for gout of her right foot recently. ? ?History of hemorrhoids ?Stable. ? ?Hyperlipidemia ?Continue atorvastatin ? ?Excessive somnolence ?Likely due to combination of medications and acute illness.  We will cut back on dose of gabapentin.  Judicious use of narcotics. ? ?Morbid obesity ?Estimated body mass index is 62.51 kg/m? as calculated from the following: ?  Height as of this encounter: 5\' 5"  (1.651 m). ?  Weight as of this encounter: 170.4 kg. ? ? ?DVT Prophylaxis: On IV heparin ?Code Status: Full code ?Family Communication: Discussed with patient ?Disposition Plan: To be determined ? ?Status is: Inpatient ?Remains inpatient appropriate because: Concern for ischemia involving right lower extremity ? ? ? ? ? ?Medications: Scheduled: ? aspirin EC  81 mg Oral Q breakfast  ? atorvastatin  10 mg Oral QHS  ? calcitRIOL  1 mcg Oral Q T,Th,Sa-HD  ? calcium carbonate  1,500 mg Oral TID with meals  ? calcium carbonate  1.5 tablet Oral With snacks  ? carvedilol  6.25 mg Oral BID WC  ? cinacalcet  30 mg Oral Q T,Th,Sa-HD  ? docusate sodium  100 mg Oral BID  ? [START ON 08/07/2021] gabapentin  100 mg Oral BID  ? HYDROcodone-acetaminophen  1 tablet Oral Once  ? insulin aspart  0-6  Units Subcutaneous TID WC  ? neomycin-bacitracin-polymyxin   Topical BID  ? nystatin   Topical TID  ? pantoprazole  40 mg Oral Daily  ? sodium chloride flush  10-40 mL Intracatheter Q12H  ? sodium chloride flush  3 mL Intravenous Q12H  ? ?Continuous: ? sodium chloride    ? sodium chloride    ? sodium chloride    ?  heparin 2,200 Units/hr (08/06/21 0555)  ? ?VVO:HYWVPX chloride, sodium chloride, sodium chloride, acetaminophen, alteplase, diphenhydrAMINE, heparin, HYDROcodone-acetaminophen, HYDROmorphone (DILAUDID) injection, lidocaine (PF), lidocaine-prilocaine, loperamide, nitroGLYCERIN, pentafluoroprop-tetrafluoroeth, sodium chloride flush, sodium chloride flush ? ?Antibiotics: ?Anti-infectives (From admission, onward)  ? ? None  ? ?  ? ? ?Objective: ? ?Vital Signs ? ?Vitals:  ? 08/06/21 0900 08/06/21 0930 08/06/21 1000 08/06/21 1030  ?BP: (!) 116/100 (!) 164/91 (!) 182/85 106/80  ?Pulse: 85 (!) 112 (!) 111 65  ?Resp: 14 17 16  (!) 23  ?Temp:      ?TempSrc:      ?SpO2:      ?Weight:      ?Height:      ? ? ?Intake/Output Summary (Last 24 hours) at 08/06/2021 1101 ?Last data filed at 08/06/2021 0600 ?Gross per 24 hour  ?Intake 1329.61 ml  ?Output 0 ml  ?Net 1329.61 ml  ? ? ?Filed Weights  ? 08/04/21 2030 08/04/21 2348 08/06/21 0745  ?Weight: (!) 170.8 kg (!) 168.2 kg (!) 170.4 kg  ? ? ?General appearance: Awake alert.  In no distress ?Resp: Clear to auscultation bilaterally.  Normal effort ?Cardio: S1-S2 is normal regular.  No S3-S4.  No rubs murmurs or bruit ?GI: Abdomen is soft.  Nontender nondistended.  Bowel sounds are present normal.  No masses organomegaly ?Extremities: Gangrenous appearing second third fourth toe on the right foot.  Cool to touch. ?Neurologic:   No focal neurological deficits.  ? ? ?Lab Results: ? ?Data Reviewed: I have personally reviewed following labs and reports of the imaging studies ? ?CBC: ?Recent Labs  ?Lab 08/03/21 ?1217 08/04/21 ?1062 08/05/21 ?6948  ?WBC 10.0 10.1 7.0  ?NEUTROABS 7.8*  --   --   ?HGB 7.8* 7.8* 8.0*  ?HCT 28.0* 28.2* 28.0*  ?MCV 102.9* 100.4* 100.4*  ?PLT 212 247 221  ? ? ? ?Basic Metabolic Panel: ?Recent Labs  ?Lab 08/03/21 ?1217 08/04/21 ?5462 08/05/21 ?7035  ?NA 139 137 137  ?K 4.0 3.7 3.8  ?CL 99 98 98  ?CO2 24 24 25   ?GLUCOSE 160* 108* 109*  ?BUN 52* 57* 32*  ?CREATININE 11.89*  13.97* 10.76*  ?CALCIUM 7.9* 8.0* 8.2*  ?PHOS  --  6.5*  --   ? ? ? ?GFR: ?Estimated Creatinine Clearance: 10 mL/min (A) (by C-G formula based on SCr of 10.76 mg/dL (H)). ? ?Liver Function Tests: ?Recent Labs  ?Lab 08/03/21 ?1217  ?AST 17  ?ALT 15  ?ALKPHOS 61  ?BILITOT 0.7  ?PROT 7.2  ?ALBUMIN 3.1*  ? ? ? ? ?Cardiac Enzymes: ?Recent Labs  ?Lab 08/03/21 ?1217  ?CKTOTAL 131  ? ? ? ?CBG: ?Recent Labs  ?Lab 08/04/21 ?1948 08/05/21 ?1145 08/05/21 ?1628 08/05/21 ?2053 08/06/21 ?0723  ?GLUCAP 110* 116* 141* 121* 92  ? ? ? ? ?Radiology Studies: ?No results found. ? ? ? ? LOS: 2 days  ? ?Bonnielee Haff ? ?[facility] ?Pager on [email or web address] ? ?08/06/2021, 11:01 AM ? ? ?

## 2021-08-06 NOTE — Progress Notes (Signed)
Physical Therapy Treatment ?Patient Details ?Name: Courtney Gray ?MRN: [id number] ?DOB: 03-16-1971 ?Today's Date: 08/06/2021 ? ? ?History of Present Illness Courtney Gray is a 51 y.o. female.        Foot Pain        Patient with medical history notable for type 2 diabetes, dialysis, gout, hypertension, hyperlipidemia presents today due to bilateral foot pain and left middle finger pain.  Patient states she started having issues ambulating in January, she has had intermittent gout flare and neuropathy which is impaired her mobility despite walkers and wheelchairs.  She says she was admitted recently and discharged 6 days ago from a SNF.  She is getting home health but states she is unsteady at home, unable to safely take care of herself at her daughter's home.  She does not feel safe in the event of an accident or emergency in the home because she would be unable to ambulate independently.       The finger pain started after checking her blood sugar, she is having pain since then.  She says it was a blood pressure, she cut it with her nail clippers which she swears were clean.  Has not had any purulent discharge but notices pain with flexion of that finger.    Patient states earlier this week she started having bilateral foot pain similar to gout versus neuropathy.  She also states she dropped her oxygen tank on her right foot.  she is on 3 L of supplemental oxygen at baseline ? ?  ?PT Comments  ? ? Patient received in bed, daughter present in room. Patient requires increased time due to concerns regarding plan and pain in B feet. She is able to sit dangled at edge of bed with R LE up on chair. Bed mobility mod independent. She is very painful and would not be able to tolerate standing at this time. Planning for procedure tomorrow ( arteriogram). She will continue to benefit from skilled PT while here to improve functional mobility and independence.  ?    ?Recommendations for follow up therapy are one component of a  multi-disciplinary discharge planning process, led by the attending physician.  Recommendations may be updated based on patient status, additional functional criteria and insurance authorization. ? ?Follow Up Recommendations ? Skilled nursing-short term rehab (<3 hours/day) ?  ?  ?Assistance Recommended at Discharge Frequent or constant Supervision/Assistance  ?Patient can return home with the following A lot of help with walking and/or transfers;Assistance with cooking/housework;Assist for transportation;Help with stairs or ramp for entrance;A little help with bathing/dressing/bathroom ?  ?Equipment Recommendations ? Hospital bed  ?  ?Recommendations for Other Services   ? ? ?  ?Precautions / Restrictions Precautions ?Precaution Comments: mod fall ?Restrictions ?Weight Bearing Restrictions: No  ?  ? ?Mobility ? Bed Mobility ?Overal bed mobility: Modified Independent ?Bed Mobility: Supine to Sit, Sit to Supine ?  ?  ?Supine to sit: Modified independent (Device/Increase time) ?Sit to supine: Modified independent (Device/Increase time) ?  ?General bed mobility comments: mod independent, use of bed rails ?  ? ?Transfers ?  ?  ?  ?  ?  ?  ?  ?  ?  ?General transfer comment: not attempted this date due to pain in B feet. Toes black on right foot ?  ? ?Ambulation/Gait ?  ?  ?  ?  ?  ?  ?  ?General Gait Details: not attempted due to pain ? ? ?Stairs ?  ?  ?  ?  ?  ? ? ?  Wheelchair Mobility ?  ? ?Modified Rankin (Stroke Patients Only) ?  ? ? ?  ?Balance Overall balance assessment: Modified Independent ?Sitting-balance support: No upper extremity supported, Feet unsupported ?Sitting balance-[name] Scale: Good ?  ?  ?  ?  ?  ?  ?  ?  ?  ?  ?  ?  ?  ?  ?  ?  ?  ? ?  ?Cognition Arousal/Alertness: Awake/alert ?Behavior During Therapy: [facility] for tasks assessed/performed ?Overall Cognitive Status: Within Functional Limits for tasks assessed ?  ?  ?  ?  ?  ?  ?  ?  ?  ?  ?  ?  ?  ?  ?  ?  ?General Comments: tearful, + time and  attention needed due to concerns ?  ?  ? ?  ?Exercises   ? ?  ?General Comments   ?  ?  ? ?Pertinent Vitals/Pain Pain Assessment ?Pain Assessment: 0-10 ?Pain Score: 10-Worst pain ever ?Pain Location: feet ?Pain Descriptors / Indicators: Constant, Numbness, Burning, Sharp, Sore ?Pain Intervention(s): Monitored during session, Repositioned  ? ? ?Home Living   ?  ?  ?  ?  ?  ?  ?  ?  ?  ?   ?  ?Prior Function    ?  ?  ?   ? ?PT Goals (current goals can now be found in the care plan section) Acute Rehab PT Goals ?Patient Stated Goal: be independent ?PT Goal Formulation: With patient ?Potential to Achieve Goals: Fair ?Progress towards PT goals: Not progressing toward goals - comment (pain limited) ? ?  ?Frequency ? ? ? Min 2X/week ? ? ? ?  ?PT Plan Current plan remains appropriate  ? ? ?Co-evaluation   ?  ?  ?  ?  ? ?  ?AM-PAC PT "6 Clicks" Mobility   ?Outcome Measure ? Help needed turning from your back to your side while in a flat bed without using bedrails?: A Little ?Help needed moving from lying on your back to sitting on the side of a flat bed without using bedrails?: A Little ?Help needed moving to and from a bed to a chair (including a wheelchair)?: A Lot ?Help needed standing up from a chair using your arms (e.g., wheelchair or bedside chair)?: A Lot ?Help needed to walk in hospital room?: A Lot ?Help needed climbing 3-5 steps with a railing? : A Lot ?6 Click Score: 14 ? ?  ?End of Session Equipment Utilized During Treatment: Oxygen ?Activity Tolerance: Patient limited by pain;Patient limited by fatigue ?Patient left: in bed;with call bell/phone within reach;with family/visitor present ?Nurse Communication: Mobility status ?PT Visit Diagnosis: Other abnormalities of gait and mobility (R26.89);Muscle weakness (generalized) (M62.81);Difficulty in walking, not elsewhere classified (R26.2);Pain ?Pain - Right/Left:  (Bilateral) ?Pain - part of body: Ankle and joints of foot ?  ? ? ?Time: 0347-4259 ?PT Time  Calculation (min) (ACUTE ONLY): 57 min ? ?Charges:  $Therapeutic Activity: 23-37 mins          ?          ? ?Amanda Cockayne, PT, GCS ?08/06/21,2:48 PM ? ?

## 2021-08-06 NOTE — Progress Notes (Signed)
Attempted ABI x2, first attempt patient in HD, second attempt patient eating. Will attempt again when patient is available as schedule permits. ? ?08/06/2021 1:18 PM ?Kelby Aline., MHA, RVT, RDCS, RDMS  ?

## 2021-08-06 NOTE — Progress Notes (Signed)
[facility] ?Progress Note ? ?Subjective: pt seen in room. R foot hurting. Wants to come off early of HD.  ? ?Vitals:  ? 08/06/21 0930 08/06/21 1000 08/06/21 1030 08/06/21 1100  ?BP: (!) 164/91 (!) 182/85 106/80 (!) (P) 173/68  ?Pulse: (!) 112 (!) 111 65 (P) 91  ?Resp: 17 16 (!) 23 (P) 12  ?Temp:      ?TempSrc:      ?SpO2:      ?Weight:      ?Height:      ? ? ?Exam: ?Gen alert, no distress ?No jvd or bruits ?Chest CTA bilat ?RRR no RG ?Abd soft obese ntnd no mass or ascites +bs ?Ext trace pretib edema, 2 toes on R foot w/ gangrenous changes ?Neuro is alert, Ox 3 , nf ? RIJ TDC ? ? ? Home meds: asa, lipitor, tums, coreg 25 bid, neurontin, oxy IR, protonix, norvasc 10, prns/ vits/ supps ? ? ? OP HD: [name] ? 4h 55min  165kg  1K/2.5Ca bath  TDC  400 BFR ? - 4/29 labs showed HBsAg neg w/ Abs < 10 ? - mircera 200ug q2 ? - calcitriol 1 ug tiw ? - sensipar 30 mg tiw ? ? ?Assessment/ Plan: ?Bilateral foot pain - w/ ischemic changes R foot+ gangrenous toes x 2. VVS consulted, planning for arteriogram.  ?ESRD - on HD TTS.  Had hospital HD on 4/30. Next HD today in progress. ?Volume - bp's slightly high, no sig edema on exam. No resp issues, lungs clear. Plan 2.5 UFG w/ HD today as tolerated.  ?HTN - getting low dose coreg here, BP's wnl.  ?Anemia ckd - Hb 7- 9 here. Gets esa at OP unit.  ?MBD ckd - CCa in range and phos up a bit. Cont sensipar and vdra w/ HD tiw. Not sure if on binders.  ? ? ? ? ?Courtney Gray Courtney Gray ?08/06/2021, 11:12 AM ? ? ?Recent Labs  ?Lab 08/03/21 ?1217 08/04/21 ?4656 08/05/21 ?8127  ?HGB 7.8* 7.8* 8.0*  ?ALBUMIN 3.1*  --   --   ?CALCIUM 7.9* 8.0* 8.2*  ?PHOS  --  6.5*  --   ?CREATININE 11.89* 13.97* 10.76*  ?K 4.0 3.7 3.8  ? ? ?Inpatient medications: ? aspirin EC  81 mg Oral Q breakfast  ? atorvastatin  10 mg Oral QHS  ? calcitRIOL  1 mcg Oral Q T,Th,Sa-HD  ? calcium carbonate  1,500 mg Oral TID with meals  ? calcium carbonate  1.5 tablet Oral With snacks  ? carvedilol  6.25 mg Oral BID WC   ? cinacalcet  30 mg Oral Q T,Th,Sa-HD  ? docusate sodium  100 mg Oral BID  ? [START ON 08/07/2021] gabapentin  100 mg Oral BID  ? HYDROcodone-acetaminophen  1 tablet Oral Once  ? insulin aspart  0-6 Units Subcutaneous TID WC  ? neomycin-bacitracin-polymyxin   Topical BID  ? nystatin   Topical TID  ? pantoprazole  40 mg Oral Daily  ? sodium chloride flush  10-40 mL Intracatheter Q12H  ? sodium chloride flush  3 mL Intravenous Q12H  ? ? sodium chloride    ? sodium chloride    ? sodium chloride    ? heparin 2,200 Units/hr (08/06/21 0555)  ? ?sodium chloride, sodium chloride, sodium chloride, acetaminophen, alteplase, diphenhydrAMINE, heparin, HYDROcodone-acetaminophen, HYDROmorphone (DILAUDID) injection, lidocaine (PF), lidocaine-prilocaine, loperamide, nitroGLYCERIN, pentafluoroprop-tetrafluoroeth, sodium chloride flush, sodium chloride flush ? ? ? ? ? ? ?

## 2021-08-06 NOTE — Progress Notes (Signed)
?[facility] ?Progress Note  ? ?Subjective:  ?Seen in HD unit. UF goal 3L. Having a lot of pain in foot.  ? ?Objective ?Vitals:  ? 08/05/21 2054 08/06/21 0545 08/06/21 0745 08/06/21 0800  ?BP: 124/63 103/74  (!) (P) 184/84  ?Pulse: 84 89  (!) (P) 108  ?Resp: 16 16  (P) 16  ?Temp: 98.2 ?F (36.8 ?C) 98.2 ?F (36.8 ?C)  (P) 98 ?F (36.7 ?C)  ?TempSrc:    (P) Oral  ?SpO2: 97% 96%  (P) 94%  ?Weight:   (!) (P) 170.4 kg   ?Height:      ?  ?(!) (P) 170.4 kg ? ?Additional Objective ?Labs: ?Basic Metabolic Panel: ?Recent Labs  ?Lab 08/03/21 ?1217 08/04/21 ?2542 08/05/21 ?7062  ?NA 139 137 137  ?K 4.0 3.7 3.8  ?CL 99 98 98  ?CO2 24 24 25   ?GLUCOSE 160* 108* 109*  ?BUN 52* 57* 32*  ?CREATININE 11.89* 13.97* 10.76*  ?CALCIUM 7.9* 8.0* 8.2*  ?PHOS  --  6.5*  --   ? ?CBC: ?Recent Labs  ?Lab 08/03/21 ?1217 08/04/21 ?3762 08/05/21 ?8315  ?WBC 10.0 10.1 7.0  ?NEUTROABS 7.8*  --   --   ?HGB 7.8* 7.8* 8.0*  ?HCT 28.0* 28.2* 28.0*  ?MCV 102.9* 100.4* 100.4*  ?PLT 212 247 221  ? ?Blood Culture ?   ?Component Value Date/Time  ? SDES BLOOD LEFT HAND 09/03/2020 2359  ? SPECREQUEST  09/03/2020 2359  ?  BOTTLES DRAWN AEROBIC AND ANAEROBIC Blood Culture adequate volume  ? CULT  09/03/2020 2359  ?  NO GROWTH 5 DAYS ?Performed at [facility], [address] [place],  [address] ?  ? REPTSTATUS 09/09/2020 FINAL 09/03/2020 2359  ? ? ? ?Physical Exam ?General: Obese woman, on nasal O2, nad  ?Heart: Distant No m,r,g  ?Lungs: Clear bilaterally  ?Abdomen: soft, non -tender ?Extremities: Trace LE edema; dusky toes R foot  ?Dialysis Access: R IJ TDC  ? ?Medications: ? sodium chloride    ? sodium chloride    ? sodium chloride    ? heparin 2,200 Units/hr (08/06/21 0555)  ? ? aspirin EC  81 mg Oral Q breakfast  ? atorvastatin  10 mg Oral QHS  ? calcitRIOL  1 mcg Oral Q T,Th,Sa-HD  ? calcium carbonate  1,500 mg Oral TID with meals  ? calcium carbonate  1.5 tablet Oral With snacks  ? carvedilol  6.25 mg Oral BID WC  ? cinacalcet  30 mg  Oral Q T,Th,Sa-HD  ? docusate sodium  100 mg Oral BID  ? [START ON 08/07/2021] gabapentin  100 mg Oral BID  ? HYDROcodone-acetaminophen  1 tablet Oral Once  ? insulin aspart  0-6 Units Subcutaneous TID WC  ? neomycin-bacitracin-polymyxin   Topical BID  ? nystatin   Topical TID  ? pantoprazole  40 mg Oral Daily  ? sodium chloride flush  10-40 mL Intracatheter Q12H  ? sodium chloride flush  3 mL Intravenous Q12H  ? ? ?Dialysis Orders:  ?[name] ? 4h 72min  165kg  1K/2.5Ca bath  TDC  400 BFR ? - 4/29 > HBsAg neg w/ Abs < 10 ? - mircera 200ug q2 ? - calcitriol 1 ug tiw ? - sensipar 30 mg tiw ? ?Assessment/Plan: ?Bilateral foot pain - w/ ischemic changes R foot+ gangrenous toes x 2. VVS consulted, planning for arteriogram tomorrow.  ?ESRD - on HD TTS.  HD today.  ?Volume - bp's slightly high, no sig edema on exam. No  resp issues, lungs clear. Attempting 3L UF with HD today  ?HTN - home meds per pmd as needed.  ?Anemia ckd - Hb 8.  Gets esa at OP unit.  ?MBD ckd - CCa in range and phos up a bit. Cont sensipar and vdra w/ HD tiw. Tums as binder. ?Chronic hypoxic respiratory failure - on home oxygen  ? ?[name] PA-C ?[name] ?08/06/2021,8:52 AM ? ? ? ? ? ? ? ?

## 2021-08-06 NOTE — Progress Notes (Signed)
ANTICOAGULATION CONSULT NOTE ? ?Pharmacy Consult for Heparin ?Indication:  ischemic limb ? ?Allergies  ?Allergen Reactions  ? Benzonatate Other (See Comments)  ?  Hallucinations  ? Hydralazine Other (See Comments)  ?  Hallucinations  ? Amoxicillin Nausea And Vomiting  ? Clonidine Other (See Comments)  ?  Altered mental state  ? Chlorhexidine Itching  ? Morphine Itching  ? Oxycodone Itching  ?  Pt tolerated hospital admission 07/2021  ? ? ?Patient Measurements: ?Height: 5\' 5"  (165.1 cm) ?Weight: (!) 168.2 kg (370 lb 13 oz) ?IBW/kg (Calculated) : 57 ?HEPARIN DW (KG): 100.3  ? ?Vital Signs: ?Temp: 99.1 ?F (37.3 ?C) (05/02 2020) ?Temp Source: Oral (05/02 2020) ?BP: 130/62 (05/02 2020) ?Pulse Rate: 79 (05/02 2020) ? ?Labs: ?Recent Labs  ?  08/04/21 ?4580 08/04/21 ?1714 08/05/21 ?9983 08/05/21 ?1430 08/06/21 ?0428 08/06/21 ?1707 08/06/21 ?1956  ?HGB 7.8*  --  8.0*  --   --   --  7.8*  ?HCT 28.2*  --  28.0*  --   --   --  28.7*  ?PLT 247  --  221  --   --   --  178  ?HEPARINUNFRC <0.10*   < > <0.10* 0.12* <0.10*  --  0.12*  ?CREATININE 13.97*  --  10.76*  --   --  8.49*  --   ? < > = values in this interval not displayed.  ? ? ? ?Estimated Creatinine Clearance: 12.6 mL/min (A) (by C-G formula based on SCr of 8.49 mg/dL (H)). ? ? ?Medical History: ?Past Medical History:  ?Diagnosis Date  ? Arthritis   ? Diabetes mellitus without complication ([place])   ? Dialysis complication, subsequent encounter   ? Gout   ? High cholesterol   ? Hypertension   ? Renal disorder   ? ? ?Assessment: ?51 yo female presents to ED with bilateral foot pain. Patient  with ESRD and on HD T,Th and Sat. Patient states earlier this week she started having bilateral foot pain similar to gout versus neuropathy. Its burning, worse with ambulation, and has been hurting since January 2023. Patient is not on oral anticoagulants. Pharmacy consulted to start heparin for concerns of ischemic leg.  ? ?Heparin level undetectable: <0.10 after rate increase, RN  reports labs collected peripherally from right arm; suspect the therapeutic level from 4/30 was a result of previous bolus. Per RN no issues with infusion or s/sx of bleeding. CBC stable, Hgb remains low 8.0. ? ?Was finally able to draw the heparin level tonight with a peripheral stick. It came back subtherapeutic again. We will increase the rate and check in AM.  ?Goal of Therapy:  ?Heparin level 0.3-0.7 units/ml ?Monitor platelets by anticoagulation protocol: Yes ?  ?Plan:  ?Bolus IV heparin 2500 units ?Increase heparin infusion 2400 units/hr  ?Check heparin level when able and plan daily while on heparin ?Continue to monitor H&H and platelets ? ? ?Onnie Boer, PharmD, BCIDP, AAHIVP, CPP ?Infectious Disease Pharmacist ?08/06/2021 9:16 PM ? ? ? ? ?

## 2021-08-06 NOTE — Progress Notes (Signed)
[facility] ? ?Subjective  - no complaints. ? ? ?Objective ?103/74 ?89 ?98.2 ?F (36.8 ?C) ?16 ?96% ? ?Intake/Output Summary (Last 24 hours) at 08/06/2021 2637 ?Last data filed at 08/06/2021 0600 ?Gross per 24 hour  ?Intake 1629.61 ml  ?Output 0 ml  ?Net 1629.61 ml  ? ? ? ? ? ?Laboratory ?Lab Results: ?Recent Labs  ?  08/04/21 ?8588 08/05/21 ?5027  ?WBC 10.1 7.0  ?HGB 7.8* 8.0*  ?HCT 28.2* 28.0*  ?PLT 247 221  ? ?BMET ?Recent Labs  ?  08/04/21 ?7412 08/05/21 ?8786  ?NA 137 137  ?K 3.7 3.8  ?CL 98 98  ?CO2 24 25  ?GLUCOSE 108* 109*  ?BUN 57* 32*  ?CREATININE 13.97* 10.76*  ?CALCIUM 8.0* 8.2*  ? ? ?COAG ?Lab Results  ?Component Value Date  ? INR 1.3 (H) 07/10/2021  ? ?No results found for: PTT ? ?Assessment/Planning: ? ?51 year old female that vascular surgery was consulted for right foot ischemia with gangrene as pictured above.  Plan aortogram with lower extremity arteriogram with a focus on the right leg tomorrow in the Cath Lab with Dr. Virl Cagey.  ABI's are still pending today.  This will allow her to get dialysis today.  Please keep n.p.o. after midnight.  Would engage podiatry as I suspect she will ultimately require toe amputations.  Risks benefits discussed.  She will be higher risk given her morbid obesity with a BMI of 61.  Discussed she would have to lay flat with moderate sedation and risk of groin complication. ? ?Marty Heck ?08/06/2021 ?6:27 AM ?-- ? ? ?

## 2021-08-06 NOTE — Progress Notes (Signed)
Per Dr Joylene Grapes nephrologist no lab draw from HD catheter. ?

## 2021-08-06 NOTE — Progress Notes (Signed)
PT Cancellation Note ? ?Patient Details ?Name: Courtney Gray ?MRN: [id number] ?DOB: Apr 14, 1970 ? ? ?Cancelled Treatment:    Reason Eval/Treat Not Completed: Patient at procedure or test/unavailable. Patient in HD this morning. Will re-attempt later today as time/availability allows.  ? ? ?[name] ?08/06/2021, 11:18 AM ? ? ?

## 2021-08-07 ENCOUNTER — Encounter (HOSPITAL_COMMUNITY): Payer: Self-pay | Admitting: Vascular Surgery

## 2021-08-07 ENCOUNTER — Encounter (HOSPITAL_COMMUNITY)
Admission: EM | Disposition: A | Discharge status: Home / Self Care | Payer: Self-pay | Source: Home / Self Care | Attending: Family Medicine

## 2021-08-07 ENCOUNTER — Inpatient Hospital Stay (HOSPITAL_COMMUNITY): Payer: 59

## 2021-08-07 ENCOUNTER — Ambulatory Visit (HOSPITAL_COMMUNITY): Admission: RE | Admit: 2021-08-07 | Payer: 59 | Source: Home / Self Care | Admitting: Vascular Surgery

## 2021-08-07 DIAGNOSIS — N186 End stage renal disease: Secondary | ICD-10-CM | POA: Diagnosis not present

## 2021-08-07 DIAGNOSIS — J9611 Chronic respiratory failure with hypoxia: Secondary | ICD-10-CM | POA: Diagnosis not present

## 2021-08-07 DIAGNOSIS — M79673 Pain in unspecified foot: Secondary | ICD-10-CM | POA: Diagnosis not present

## 2021-08-07 DIAGNOSIS — I96 Gangrene, not elsewhere classified: Secondary | ICD-10-CM | POA: Diagnosis not present

## 2021-08-07 HISTORY — PX: ABDOMINAL AORTOGRAM W/LOWER EXTREMITY: CATH118223

## 2021-08-07 LAB — GLUCOSE, CAPILLARY
Glucose-Capillary: 65 mg/dL — ABNORMAL LOW (ref 70–99)
Glucose-Capillary: 104 mg/dL — ABNORMAL HIGH (ref 70–99)
Glucose-Capillary: 72 mg/dL (ref 70–99)
Glucose-Capillary: 93 mg/dL (ref 70–99)

## 2021-08-07 LAB — CBC
HCT: 28.3 % — ABNORMAL LOW (ref 36.0–46.0)
Hemoglobin: 8.1 g/dL — ABNORMAL LOW (ref 12.0–15.0)
MCH: 28.7 pg (ref 26.0–34.0)
MCHC: 28.6 g/dL — ABNORMAL LOW (ref 30.0–36.0)
MCV: 100.4 fL — ABNORMAL HIGH (ref 80.0–100.0)
Platelets: 219 10*3/uL (ref 150–400)
RBC: 2.82 MIL/uL — ABNORMAL LOW (ref 3.87–5.11)
RDW: 18 % — ABNORMAL HIGH (ref 11.5–15.5)
WBC: 7.4 10*3/uL (ref 4.0–10.5)
nRBC: 0.4 % — ABNORMAL HIGH (ref 0.0–0.2)

## 2021-08-07 LAB — BASIC METABOLIC PANEL
Anion gap: 14 (ref 5–15)
BUN: 24 mg/dL — ABNORMAL HIGH (ref 6–20)
CO2: 24 mmol/L (ref 22–32)
Calcium: 7.8 mg/dL — ABNORMAL LOW (ref 8.9–10.3)
Chloride: 97 mmol/L — ABNORMAL LOW (ref 98–111)
Creatinine, Ser: 10.11 mg/dL — ABNORMAL HIGH (ref 0.44–1.00)
GFR, Estimated: 4 mL/min — ABNORMAL LOW (ref >60)
Glucose, Bld: 64 mg/dL — ABNORMAL LOW (ref 70–99)
Potassium: 4.1 mmol/L (ref 3.5–5.1)
Sodium: 135 mmol/L (ref 135–145)

## 2021-08-07 LAB — HEPARIN LEVEL (UNFRACTIONATED): Heparin Unfractionated: 0.31 IU/mL (ref 0.30–0.70)

## 2021-08-07 SURGERY — ABDOMINAL AORTOGRAM W/LOWER EXTREMITY
Anesthesia: LOCAL

## 2021-08-07 MED ORDER — DEXTROSE 50 % IV SOLN
INTRAVENOUS | Status: AC
  Administered 2021-08-07: 25 g
  Filled 2021-08-07: qty 50

## 2021-08-07 MED ORDER — MIDAZOLAM HCL 2 MG/2ML IJ SOLN
INTRAMUSCULAR | Status: DC | PRN
Start: 2021-08-07 — End: 2021-08-07
  Administered 2021-08-07: 1 mg via INTRAVENOUS

## 2021-08-07 MED ORDER — IODIXANOL 320 MG/ML IV SOLN
INTRAVENOUS | Status: DC | PRN
  Administered 2021-08-07: 132 mL

## 2021-08-07 MED ORDER — HEPARIN SODIUM (PORCINE) 1000 UNIT/ML DIALYSIS
1000.0000 [IU] | INTRAMUSCULAR | Status: DC | PRN
  Administered 2021-08-08: 1000 [IU] via INTRAVENOUS_CENTRAL
  Filled 2021-08-07: qty 1

## 2021-08-07 MED ORDER — LIDOCAINE-PRILOCAINE 2.5-2.5 % EX CREA: 1.0000 "application " | TOPICAL_CREAM | CUTANEOUS | Status: DC | PRN

## 2021-08-07 MED ORDER — ONDANSETRON HCL 4 MG/2ML IJ SOLN: 4.0000 mg | Freq: Four times a day (QID) | INTRAMUSCULAR | Status: DC | PRN

## 2021-08-07 MED ORDER — HEPARIN SODIUM (PORCINE) 1000 UNIT/ML DIALYSIS
20.0000 [IU]/kg | INTRAMUSCULAR | Status: DC | PRN
  Administered 2021-08-08: 3400 [IU] via INTRAVENOUS_CENTRAL
  Filled 2021-08-07: qty 4

## 2021-08-07 MED ORDER — LABETALOL HCL 5 MG/ML IV SOLN
INTRAVENOUS | Status: DC | PRN
  Administered 2021-08-07: 20 mg via INTRAVENOUS

## 2021-08-07 MED ORDER — SODIUM CHLORIDE 0.9 % IV SOLN: 250.0000 mL | INTRAVENOUS | Status: DC | PRN

## 2021-08-07 MED ORDER — HEPARIN SODIUM (PORCINE) 5000 UNIT/ML IJ SOLN: 5000.0000 [IU] | Freq: Three times a day (TID) | INTRAMUSCULAR | Status: DC

## 2021-08-07 MED ORDER — SODIUM CHLORIDE 0.9% FLUSH
3.0000 mL | Freq: Two times a day (BID) | INTRAVENOUS | Status: DC
  Administered 2021-08-07 – 2021-08-08 (×3): 3 mL via INTRAVENOUS

## 2021-08-07 MED ORDER — FENTANYL CITRATE (PF) 100 MCG/2ML IJ SOLN
INTRAMUSCULAR | Status: DC | PRN
Start: 2021-08-07 — End: 2021-08-07
  Administered 2021-08-07 (×2): 50 ug via INTRAVENOUS

## 2021-08-07 MED ORDER — MIDAZOLAM HCL 2 MG/2ML IJ SOLN
INTRAMUSCULAR | Status: AC
  Filled 2021-08-07: qty 2

## 2021-08-07 MED ORDER — SODIUM CHLORIDE 0.9 % IV SOLN: 100.0000 mL | INTRAVENOUS | Status: DC | PRN

## 2021-08-07 MED ORDER — MUPIROCIN 2 % EX OINT
1.0000 "application " | TOPICAL_OINTMENT | Freq: Two times a day (BID) | CUTANEOUS | Status: DC
  Administered 2021-08-07 – 2021-08-24 (×34): 1 via TOPICAL
  Filled 2021-08-07 (×6): qty 22

## 2021-08-07 MED ORDER — ACETAMINOPHEN 325 MG PO TABS: 650.0000 mg | ORAL_TABLET | ORAL | Status: DC | PRN

## 2021-08-07 MED ORDER — LABETALOL HCL 5 MG/ML IV SOLN
INTRAVENOUS | Status: AC
  Filled 2021-08-07: qty 4

## 2021-08-07 MED ORDER — LIDOCAINE HCL (PF) 1 % IJ SOLN
INTRAMUSCULAR | Status: DC | PRN
  Administered 2021-08-07: 12 mL

## 2021-08-07 MED ORDER — HEPARIN (PORCINE) IN NACL 1000-0.9 UT/500ML-% IV SOLN
INTRAVENOUS | Status: DC | PRN
  Administered 2021-08-07 (×2): 500 mL

## 2021-08-07 MED ORDER — PENTAFLUOROPROP-TETRAFLUOROETH EX AERO: 1.0000 "application " | INHALATION_SPRAY | CUTANEOUS | Status: DC | PRN

## 2021-08-07 MED ORDER — FENTANYL CITRATE (PF) 100 MCG/2ML IJ SOLN
INTRAMUSCULAR | Status: AC
  Filled 2021-08-07: qty 2

## 2021-08-07 MED ORDER — HEPARIN (PORCINE) IN NACL 1000-0.9 UT/500ML-% IV SOLN
INTRAVENOUS | Status: AC
  Filled 2021-08-07: qty 1000

## 2021-08-07 MED ORDER — LIDOCAINE 5 % EX PTCH
1.0000 | MEDICATED_PATCH | CUTANEOUS | Status: DC
  Administered 2021-08-11 – 2021-08-13 (×3): 1 via TRANSDERMAL
  Filled 2021-08-07 (×9): qty 1

## 2021-08-07 MED ORDER — ALTEPLASE 2 MG IJ SOLR: 2.0000 mg | Freq: Once | INTRAMUSCULAR | Status: DC | PRN

## 2021-08-07 MED ORDER — LIDOCAINE HCL (PF) 1 % IJ SOLN
INTRAMUSCULAR | Status: AC
  Filled 2021-08-07: qty 30

## 2021-08-07 MED ORDER — LABETALOL HCL 5 MG/ML IV SOLN: 10.0000 mg | INTRAVENOUS | Status: DC | PRN

## 2021-08-07 MED ORDER — SODIUM CHLORIDE 0.9% FLUSH: 3.0000 mL | INTRAVENOUS | Status: DC | PRN

## 2021-08-07 MED ORDER — LIDOCAINE HCL (PF) 1 % IJ SOLN: 5.0000 mL | INTRAMUSCULAR | Status: DC | PRN

## 2021-08-07 SURGICAL SUPPLY — 9 items
CATH OMNI FLUSH 5F 65CM (CATHETERS) ×1 IMPLANT
CLOSURE PERCLOSE PROSTYLE (VASCULAR PRODUCTS) ×1 IMPLANT
GLIDEWIRE ADV .035X260CM (WIRE) ×1 IMPLANT
KIT MICROPUNCTURE NIT STIFF (SHEATH) ×1 IMPLANT
KIT PV (KITS) ×2 IMPLANT
SHEATH PINNACLE 5F 10CM (SHEATH) ×2 IMPLANT
TRANSDUCER W/STOPCOCK (MISCELLANEOUS) ×2 IMPLANT
TRAY PV CATH (CUSTOM PROCEDURE TRAY) ×2 IMPLANT
WIRE BENTSON .035X145CM (WIRE) ×1 IMPLANT

## 2021-08-07 NOTE — Progress Notes (Signed)
?PROGRESS NOTE ? ? ? ?Kollyns Mickelson  ERD:[id number] DOB: Jun 23, 1970 DOA: 08/03/2021 ?PCP: Medicine, [facility]  ? ?Brief Narrative:  ?51 y.o. female with medical history significant of end-stage renal disease on hemodialysis, gout, diabetes mellitus type II, hypertension who just discharged from rehab this past Monday and was told that she had run out of her days and that she needed to pay out-of-pocket for any further rehab.  She presented again with worsening right foot pain and there was a concern for ischemic foot.  She was admitted and transferred to [facility]. ? ?Assessment & Plan: ?  ?Ischemic right foot ?-Evidence of gangrenous changes in some of her toes of the right foot. ?-CT angiogram was inconclusive due to significant calcification. ?-Vascular surgery planning for a arteriogram today.  Remains on IV heparin. ?-Podiatry also following. ? ?End-stage renal disease on hemodialysis ?-Nephrology following.  Dialysis as per nephrology schedule ? ?Chronic respiratory failure with hypoxia ?-Chronically on 3 L oxygen via nasal cannula.  Respiratory status currently stable ? ?Diabetes mellitus type 2 with hypoglycemia ?-A1c 5.7. ?-Still has intermittent episodes of hypoglycemia.  Continue CBGs. ?-Glipizide and Lantus were discontinued during last hospitalization because of episodes of hypoglycemia ? ?Essential hypertension ?-Blood pressure intermittently on the lower side.  Currently on lower dose of Coreg.  Amlodipine on hold ? ?Anemia of chronic kidney disease ?Macrocytosis ?-Hemoglobin currently stable.  Transfuse if hemoglobin is less than 7 ? ?Hyperlipidemia ?-continue statin ? ?Hypersomnolence ?-Likely due to combination of illness and medications.  Gabapentin dose has been decreased.  Judicious use of narcotics ? ?History of hemorrhoids ?-Stable ? ?Questionable history of gout ?-apparently treated for gout of her right foot recently ? ?Morbid obesity ?-Outpatient follow-up ? ? ?DVT prophylaxis:  IV heparin ?Code Status: Full ?Family Communication: None at bedside ?Disposition Plan: ?Status is: Inpatient ?Remains inpatient appropriate because: Of severity of illness ? ? ?Consultants: Vascular surgery/podiatry/nephrology ? ?Procedures: None ? ?Antimicrobials: None ? ? ?Subjective: ?Patient seen and examined at bedside.  Nursing staff reports episodes of low blood sugar.  Patient denies any dizziness, worsening shortness of breath, fever or vomiting.  Complains of intermittent lower extremity pain. ? ?Objective: ?Vitals:  ? 08/07/21 1139 08/07/21 1146 08/07/21 1200 08/07/21 1307  ?BP: (!) 141/70 (!) 150/47 (!) 152/54 (!) 131/59  ?Pulse: 70 70 71 72  ?Resp: 20 14 12 16   ?Temp:      ?TempSrc:      ?SpO2: 99% 100% 95% 98%  ?Weight:      ?Height:      ? ? ?Intake/Output Summary (Last 24 hours) at 08/07/2021 1348 ?Last data filed at 08/07/2021 0600 ?Gross per 24 hour  ?Intake 678.63 ml  ?Output 0 ml  ?Net 678.63 ml  ? ?Filed Weights  ? 08/04/21 2348 08/06/21 0745 08/06/21 1115  ?Weight: (!) 168.2 kg (!) 170.4 kg (!) 168.2 kg  ? ? ?Examination: ? ?General exam: Appears calm and comfortable.  Looks chronically ill and deconditioned.  Currently on room air. ?Respiratory system: Bilateral decreased breath sounds at bases with scattered crackles ?Cardiovascular system: S1 & S2 heard, Rate controlled ?Gastrointestinal system: Abdomen is nondistended, soft and nontender. Normal bowel sounds heard. ?Extremities: No cyanosis, clubbing; lower extremity edema present. ?Central nervous system: Alert and oriented.  Slow to respond.  Poor historian.  No focal neurological deficits. Moving extremities ?Skin: Right third and fourth toes with gangrenous changes ?Psychiatry: Affect is mostly flat.  No signs of agitation.   ? ? ?Data Reviewed: I have  personally reviewed following labs and imaging studies ? ?CBC: ?Recent Labs  ?Lab 08/03/21 ?1217 08/04/21 ?9476 08/05/21 ?5465 08/06/21 ?1956 08/07/21 ?0354  ?WBC 10.0 10.1 7.0 8.5 7.4   ?NEUTROABS 7.8*  --   --   --   --   ?HGB 7.8* 7.8* 8.0* 7.8* 8.1*  ?HCT 28.0* 28.2* 28.0* 28.7* 28.3*  ?MCV 102.9* 100.4* 100.4* 102.1* 100.4*  ?PLT 212 247 221 178 219  ? ?Basic Metabolic Panel: ?Recent Labs  ?Lab 08/03/21 ?1217 08/04/21 ?6568 08/05/21 ?1275 08/06/21 ?1707 08/07/21 ?1700  ?NA 139 137 137 138 135  ?K 4.0 3.7 3.8 3.9 4.1  ?CL 99 98 98 99 97*  ?CO2 24 24 25 27 24   ?GLUCOSE 160* 108* 109* 72 64*  ?BUN 52* 57* 32* 19 24*  ?CREATININE 11.89* 13.97* 10.76* 8.49* 10.11*  ?CALCIUM 7.9* 8.0* 8.2* 8.1* 7.8*  ?PHOS  --  6.5*  --   --   --   ? ?GFR: ?Estimated Creatinine Clearance: 10.5 mL/min (A) (by C-G formula based on SCr of 10.11 mg/dL (H)). ?Liver Function Tests: ?Recent Labs  ?Lab 08/03/21 ?1217  ?AST 17  ?ALT 15  ?ALKPHOS 61  ?BILITOT 0.7  ?PROT 7.2  ?ALBUMIN 3.1*  ? ?No results for input(s): LIPASE, AMYLASE in the last 168 hours. ?No results for input(s): AMMONIA in the last 168 hours. ?Coagulation Profile: ?No results for input(s): INR, PROTIME in the last 168 hours. ?Cardiac Enzymes: ?Recent Labs  ?Lab 08/03/21 ?1217  ?CKTOTAL 131  ? ?BNP (last 3 results) ?No results for input(s): PROBNP in the last 8760 hours. ?HbA1C: ?No results for input(s): HGBA1C in the last 72 hours. ?CBG: ?Recent Labs  ?Lab 08/06/21 ?1705 08/06/21 ?1744 08/06/21 ?2104 08/07/21 ?1749 08/07/21 ?0844  ?GLUCAP 69* 72 77 65* 104*  ? ?Lipid Profile: ?No results for input(s): CHOL, HDL, LDLCALC, TRIG, CHOLHDL, LDLDIRECT in the last 72 hours. ?Thyroid Function Tests: ?No results for input(s): TSH, T4TOTAL, FREET4, T3FREE, THYROIDAB in the last 72 hours. ?Anemia Panel: ?No results for input(s): VITAMINB12, FOLATE, FERRITIN, TIBC, IRON, RETICCTPCT in the last 72 hours. ?Sepsis Labs: ?No results for input(s): PROCALCITON, LATICACIDVEN in the last 168 hours. ? ?No results found for this or any previous visit (from the past 240 hour(s)).  ? ? ? ? ? ?Radiology Studies: ?PERIPHERAL VASCULAR CATHETERIZATION ? ?Result Date: 08/07/2021 ?Images  from the original result were not included. Patient name: Jannah Guardiola MRN: [id number] DOB: [birth year] Sex: female 08/07/2021 Pre-operative Diagnosis: Right lower extremity Rutherford 5 critical limb ischemia Post-operative diagnosis:  Same Surgeon:  Broadus John, MD Procedure Performed: 1.  Ultrasound-guided micropuncture access of the left common femoral artery 2.  Aortogram 3.  Second-order cannulation, right lower extremity angiogram 4.  Left lower extremity angiogram 5.  Device assisted closure-Pro-glide 6.  Contrast volume 132 7.  Sedation 40 minutes Indications:  51 year old female that vascular surgery was consulted for right foot ischemia with gangrene as pictured above.  Patient with Rutherford 5 critical limb ischemia in the right leg.  She would benefit from right lower extremity angiography in an effort to define and improve distal perfusion for wound healing. Prior to angiography today, I had a long discussion with her regarding the case, including risks and benefits. She was most worried she would not be able to lie flat.  We have placed her on a wedge in an effort to keep her head slightly elevated.  Furthermore, she is complaining of itching.  She initially wanted to cancel her case,  however elected to proceed after hearing that I would not be able to do her case later today and that she would have to be rescheduled either Thursday or Friday.  Veronica's morbid obesity places her in a high risk category for angiography, especially as the common femoral artery must be accessed.  I discussed with her that I would terminate the case should any portion be deemed unsafe, or if she became able to remain still. Overall the above conversation with Laila was difficult.  All questions she asked were accusatory.  I told Verdene Lennert that I was happy to take care of her and my first priority was her safety.  I appreciate hospital medicine bringing podiatry on board, as she will likely require amputations.  Findings: Aortogram: Bilateral renal arteries not appreciated, aortoiliac system widely patent On the right: Severe calcific disease throughout.  No flow-limiting stenosis appreciated in the common femoral, pr

## 2021-08-07 NOTE — Progress Notes (Signed)
Attempted ABI for third time, however patient was in the OR. Will attempt again as needed when patient is available, as schedule permits. ? ?08/07/2021 8:51 AM ?Kelby Aline., MHA, RVT, RDCS, RDMS   ?

## 2021-08-07 NOTE — Progress Notes (Signed)
ANTICOAGULATION CONSULT NOTE ? ?Pharmacy Consult for Heparin ?Indication:  ischemic limb ? ?Allergies  ?Allergen Reactions  ? Benzonatate Other (See Comments)  ?  Hallucinations  ? Hydralazine Other (See Comments)  ?  Hallucinations  ? Amoxicillin Nausea And Vomiting  ? Clonidine Other (See Comments)  ?  Altered mental state  ? Chlorhexidine Itching  ? Morphine Itching  ? Oxycodone Itching  ?  Pt tolerated hospital admission 07/2021  ? ? ?Patient Measurements: ?Height: 5\' 5"  (165.1 cm) ?Weight: (!) 168.2 kg (370 lb 13 oz) ?IBW/kg (Calculated) : 57 ?HEPARIN DW (KG): 100.3  ? ?Vital Signs: ?Temp: 99.8 ?F (37.7 ?C) (05/03 0456) ?Temp Source: Oral (05/03 0456) ?BP: 92/43 (05/03 0456) ?Pulse Rate: 79 (05/03 0456) ? ?Labs: ?Recent Labs  ?  08/05/21 ?0651 08/05/21 ?1430 08/06/21 ?0428 08/06/21 ?1707 08/06/21 ?1956 08/07/21 ?4917  ?HGB 8.0*  --   --   --  7.8* 8.1*  ?HCT 28.0*  --   --   --  28.7* 28.3*  ?PLT 221  --   --   --  178 219  ?HEPARINUNFRC <0.10*   < > <0.10*  --  0.12* 0.31  ?CREATININE 10.76*  --   --  8.49*  --  10.11*  ? < > = values in this interval not displayed.  ? ? ? ?Estimated Creatinine Clearance: 10.5 mL/min (A) (by C-G formula based on SCr of 10.11 mg/dL (H)). ? ? ?Medical History: ?Past Medical History:  ?Diagnosis Date  ? Arthritis   ? Diabetes mellitus without complication ([place])   ? Dialysis complication, subsequent encounter   ? Gout   ? High cholesterol   ? Hypertension   ? Renal disorder   ? ? ?Assessment: ?51 yo female presents to ED with bilateral foot pain. Patient  with ESRD and on HD T,Th and Sat. Patient states earlier this week she started having bilateral foot pain similar to gout versus neuropathy. Its burning, worse with ambulation, and has been hurting since January 2023. Patient is not on oral anticoagulants. Pharmacy consulted to start heparin for concerns of ischemic leg.  ? ?Heparin level this AM is 0.31 (therapeutic). Hgb stable ? ?Heparin level 0.3-0.7 units/ml ?Monitor  platelets by anticoagulation protocol: Yes ?  ?Plan:  ? ?Continue heparin infusion 2400 units/hr  ?Check confirmatory heparin level when able and plan daily while on heparin ?Continue to monitor H&H and platelets ? ? ?Psalm Arman A. Levada Dy, PharmD, BCPS, FNKF ?Clinical Pharmacist ?[place] ?Please utilize Amion for appropriate phone number to reach the unit pharmacist ([place]) ? ?08/07/2021 8:02 AM ? ? ? ? ?

## 2021-08-07 NOTE — Progress Notes (Signed)
?[facility] ?Progress Note  ? ?Subjective:  ?Dialysis yesterday net UF 2.2L  ?Had LE angiogram with [facility] this am. Comfortable in room. Wants something for itching.  ? ?Objective ?Vitals:  ? 08/07/21 1046 08/07/21 1051 08/07/21 1056 08/07/21 1124  ?BP: (!) 141/42 136/62 (!) 149/74 (!) 147/49  ?Pulse: 68 69 71 70  ?Resp: 16 17 20 16   ?Temp:    97.9 ?F (36.6 ?C)  ?TempSrc:    Oral  ?SpO2: 100% 90% 95% 99%  ?Weight:      ?Height:      ?  ? ? ? ?Additional Objective ?Labs: ?Basic Metabolic Panel: ?Recent Labs  ?Lab 08/04/21 ?0258 08/05/21 ?5277 08/06/21 ?1707 08/07/21 ?8242  ?NA 137 137 138 135  ?K 3.7 3.8 3.9 4.1  ?CL 98 98 99 97*  ?CO2 24 25 27 24   ?GLUCOSE 108* 109* 72 64*  ?BUN 57* 32* 19 24*  ?CREATININE 13.97* 10.76* 8.49* 10.11*  ?CALCIUM 8.0* 8.2* 8.1* 7.8*  ?PHOS 6.5*  --   --   --   ? ? ?CBC: ?Recent Labs  ?Lab 08/03/21 ?1217 08/04/21 ?3536 08/05/21 ?1443 08/06/21 ?1956 08/07/21 ?1540  ?WBC 10.0 10.1 7.0 8.5 7.4  ?NEUTROABS 7.8*  --   --   --   --   ?HGB 7.8* 7.8* 8.0* 7.8* 8.1*  ?HCT 28.0* 28.2* 28.0* 28.7* 28.3*  ?MCV 102.9* 100.4* 100.4* 102.1* 100.4*  ?PLT 212 247 221 178 219  ? ? ?Blood Culture ?   ?Component Value Date/Time  ? SDES BLOOD LEFT HAND 09/03/2020 2359  ? SPECREQUEST  09/03/2020 2359  ?  BOTTLES DRAWN AEROBIC AND ANAEROBIC Blood Culture adequate volume  ? CULT  09/03/2020 2359  ?  NO GROWTH 5 DAYS ?Performed at [facility], [address], [place], [place] [address] ?  ? REPTSTATUS 09/09/2020 FINAL 09/03/2020 2359  ? ? ? ?Physical Exam ?General: Obese woman, on nasal O2, nad  ?Heart: Distant No m,r,g  ?Lungs: Clear bilaterally  ?Abdomen: soft, non -tender ?Extremities: 1+pitting LE edema bilaterally, dusky toes R foot  ?Dialysis Access: R IJ TDC  ? ?Medications: ? sodium chloride    ? heparin 2,400 Units/hr (08/06/21 2119)  ? ? aspirin EC  81 mg Oral Q breakfast  ? atorvastatin  10 mg Oral QHS  ? calcitRIOL  1 mcg Oral Q T,Th,Sa-HD  ? calcium carbonate  1,500 mg Oral TID with  meals  ? calcium carbonate  1.5 tablet Oral With snacks  ? carvedilol  6.25 mg Oral BID WC  ? cinacalcet  30 mg Oral Q T,Th,Sa-HD  ? docusate sodium  100 mg Oral BID  ? gabapentin  100 mg Oral BID  ? insulin aspart  0-6 Units Subcutaneous TID WC  ? mupirocin ointment  1 application. Topical BID  ? neomycin-bacitracin-polymyxin   Topical BID  ? nystatin   Topical TID  ? pantoprazole  40 mg Oral Daily  ? sodium chloride flush  10-40 mL Intracatheter Q12H  ? sodium chloride flush  3 mL Intravenous Q12H  ? ? ?Dialysis Orders:  ?[name] ? 4h 22min  165kg  1K/2.5Ca bath  TDC  400 BFR ? - 4/29 > HBsAg neg w/ Abs < 10 ? - mircera 200ug q2 ? - calcitriol 1 ug tiw ? - sensipar 30 mg tiw ? ?Assessment/Plan: ?Bilateral foot pain - w/ ischemic changes R foot+ gangrenous toes x 2. VVS consulted. LE angiogram today Dr. Virl Cagey - severe calcific disease bilaterally. Does not have  any endovascular options for improvement.  ?ESRD - on HD TTS.  Next HD 5/4  ?Volume - BP ok.  Not to EDW yet.  Continue UF as able for volume.  ?HTN - home meds per pmd as needed.  ?Anemia ckd - Hb 8.  Gets esa at OP unit.  ?MBD ckd - CCa in range and phos up a bit. Cont sensipar and vdra w/ HD tiw. Tums as binder. ?Chronic hypoxic respiratory failure - on home oxygen  ? ?[name] PA-C ?[facility] ?08/07/2021,11:25 AM ? ? ? ? ? ? ? ?

## 2021-08-07 NOTE — Progress Notes (Signed)
Patient returned from cath lab and heparin no longer running. No orders to restart after cath procedure. Spoke with pharmacist regarding heparin levels. Pharmacist reached out to Dr. Virl Cagey and indicated to discontinue heparin at this time.  ?[place] C Loyd [place]  ?

## 2021-08-07 NOTE — Consult Note (Signed)
? ?Reason for Consult: Gangrene of right foot ?Referring Physician: Dr. Maryland Pink ? ?Courtney Gray is an 51 y.o. female.  ?HPI: She is admitted for right foot worsening pain and discoloration.  She injured the foot in January of this year and it has slowly worsened and failed to heal.  The discoloration and blistering started quite significantly over the last week.  She also has a small ulcer on a finger of the left hand that has been there about a week.  Some tenderness in the toes of the left foot as well. ? ?Past Medical History:  ?Diagnosis Date  ? Arthritis   ? Diabetes mellitus without complication ([place])   ? Dialysis complication, subsequent encounter   ? Gout   ? High cholesterol   ? Hypertension   ? Renal disorder   ? ? ?Past Surgical History:  ?Procedure Laterality Date  ? AV FISTULA PLACEMENT    ? x2, unsuccessful.   ? IR FLUORO GUIDE CV LINE LEFT  12/14/2020  ? IR FLUORO GUIDE CV LINE LEFT  02/20/2021  ? IR FLUORO GUIDE CV LINE RIGHT  08/28/2020  ? IR FLUORO GUIDE CV LINE RIGHT  05/13/2021  ? IR FLUORO GUIDE CV LINE RIGHT  05/24/2021  ? IR PERC TUN PERIT CATH WO PORT S&I /IMAG  09/06/2020  ? IR PTA VENOUS EXCEPT DIALYSIS CIRCUIT  02/20/2021  ? IR PTA VENOUS EXCEPT DIALYSIS CIRCUIT  05/13/2021  ? IR REMOVAL TUN CV CATH W/O FL  05/24/2021  ? IR US GUIDE VASC ACCESS LEFT  09/06/2020  ? IR US GUIDE VASC ACCESS RIGHT  05/24/2021  ? IR VENOCAVAGRAM SVC  02/20/2021  ? ? ?Family History  ?Problem Relation Age of Onset  ? Diabetes Mother   ? Diabetes Father   ? ? ?Social History:  reports that she has never smoked. She has never used smokeless tobacco. She reports that she does not drink alcohol and does not use drugs. ? ?Allergies:  ?Allergies  ?Allergen Reactions  ? Benzonatate Other (See Comments)  ?  Hallucinations  ? Hydralazine Other (See Comments)  ?  Hallucinations  ? Amoxicillin Nausea And Vomiting  ? Clonidine Other (See Comments)  ?  Altered mental state  ? Chlorhexidine Itching  ? Morphine Itching  ? Oxycodone  Itching  ?  Pt tolerated hospital admission 07/2021  ? ? ?Medications: I have reviewed the patient's current medications. ? ?Results for orders placed or performed during the hospital encounter of 08/03/21 (from the past 48 hour(s))  ?Glucose, capillary     Status: Abnormal  ? Collection Time: 08/05/21 11:45 AM  ?Result Value Ref Range  ? Glucose-Capillary 116 (H) 70 - 99 mg/dL  ?  Comment: Glucose reference range applies only to samples taken after fasting for at least 8 hours.  ?Heparin level (unfractionated)     Status: Abnormal  ? Collection Time: 08/05/21  2:30 PM  ?Result Value Ref Range  ? Heparin Unfractionated 0.12 (L) 0.30 - 0.70 IU/mL  ?  Comment: (NOTE) ?The clinical reportable range upper limit is being lowered to >1.10 ?to align with the FDA approved guidance for the current laboratory ?assay. ? ?If heparin results are below expected values, and patient dosage has  ?been confirmed, suggest follow up testing of antithrombin III levels. ?Performed at [facility], [place] [address] [place], [place] ?[address] ?  ?Glucose, capillary     Status: Abnormal  ? Collection Time: 08/05/21  4:28 PM  ?Result Value Ref Range  ?  Glucose-Capillary 141 (H) 70 - 99 mg/dL  ?  Comment: Glucose reference range applies only to samples taken after fasting for at least 8 hours.  ?Glucose, capillary     Status: Abnormal  ? Collection Time: 08/05/21  8:53 PM  ?Result Value Ref Range  ? Glucose-Capillary 121 (H) 70 - 99 mg/dL  ?  Comment: Glucose reference range applies only to samples taken after fasting for at least 8 hours.  ?Heparin level (unfractionated)     Status: Abnormal  ? Collection Time: 08/06/21  4:28 AM  ?Result Value Ref Range  ? Heparin Unfractionated <0.10 (L) 0.30 - 0.70 IU/mL  ?  Comment: (NOTE) ?The clinical reportable range upper limit is being lowered to >1.10 ?to align with the FDA approved guidance for the current laboratory ?assay. ? ?If heparin results are below expected values, and patient dosage  has  ?been confirmed, suggest follow up testing of antithrombin III levels. ?Performed at [facility], [place] [address], [place], [place] ?[address] ?  ?Glucose, capillary     Status: None  ? Collection Time: 08/06/21  7:23 AM  ?Result Value Ref Range  ? Glucose-Capillary 92 70 - 99 mg/dL  ?  Comment: Glucose reference range applies only to samples taken after fasting for at least 8 hours.  ?Glucose, capillary     Status: None  ? Collection Time: 08/06/21 11:50 AM  ?Result Value Ref Range  ? Glucose-Capillary 85 70 - 99 mg/dL  ?  Comment: Glucose reference range applies only to samples taken after fasting for at least 8 hours.  ?Glucose, capillary     Status: Abnormal  ? Collection Time: 08/06/21  5:05 PM  ?Result Value Ref Range  ? Glucose-Capillary 69 (L) 70 - 99 mg/dL  ?  Comment: Glucose reference range applies only to samples taken after fasting for at least 8 hours.  ?Basic metabolic panel     Status: Abnormal  ? Collection Time: 08/06/21  5:07 PM  ?Result Value Ref Range  ? Sodium 138 135 - 145 mmol/L  ? Potassium 3.9 3.5 - 5.1 mmol/L  ? Chloride 99 98 - 111 mmol/L  ? CO2 27 22 - 32 mmol/L  ? Glucose, Bld 72 70 - 99 mg/dL  ?  Comment: Glucose reference range applies only to samples taken after fasting for at least 8 hours.  ? BUN 19 6 - 20 mg/dL  ? Creatinine, Ser 8.49 (H) 0.44 - 1.00 mg/dL  ? Calcium 8.1 (L) 8.9 - 10.3 mg/dL  ? GFR, Estimated 5 (L) >60 mL/min  ?  Comment: (NOTE) ?Calculated using the CKD-EPI Creatinine Equation (2021) ?  ? Anion gap 12 5 - 15  ?  Comment: Performed at [facility], [place] [address], [place], [place] [address]  ?Glucose, capillary     Status: None  ? Collection Time: 08/06/21  5:44 PM  ?Result Value Ref Range  ? Glucose-Capillary 72 70 - 99 mg/dL  ?  Comment: Glucose reference range applies only to samples taken after fasting for at least 8 hours.  ?Heparin level (unfractionated)     Status: Abnormal  ? Collection Time: 08/06/21  7:56 PM  ?Result Value Ref Range  ?  Heparin Unfractionated 0.12 (L) 0.30 - 0.70 IU/mL  ?  Comment: (NOTE) ?The clinical reportable range upper limit is being lowered to >1.10 ?to align with the FDA approved guidance for the current laboratory ?assay. ? ?If heparin results are below expected values, and patient dosage  has  ?been confirmed, suggest follow up testing of antithrombin III levels. ?Performed at [facility], [place] [address] [place], [place] ?[address] ?  ?CBC     Status: Abnormal  ? Collection Time: 08/06/21  7:56 PM  ?Result Value Ref Range  ? WBC 8.5 4.0 - 10.5 K/uL  ? RBC 2.81 (L) 3.87 - 5.11 MIL/uL  ? Hemoglobin 7.8 (L) 12.0 - 15.0 g/dL  ? HCT 28.7 (L) 36.0 - 46.0 %  ? MCV 102.1 (H) 80.0 - 100.0 fL  ? MCH 27.8 26.0 - 34.0 pg  ? MCHC 27.2 (L) 30.0 - 36.0 g/dL  ? RDW 17.8 (H) 11.5 - 15.5 %  ? Platelets 178 150 - 400 K/uL  ? nRBC 0.7 (H) 0.0 - 0.2 %  ?  Comment: Performed at [facility], [place] [address] [place], [place] [address]  ?Glucose, capillary     Status: None  ? Collection Time: 08/06/21  9:04 PM  ?Result Value Ref Range  ? Glucose-Capillary 77 70 - 99 mg/dL  ?  Comment: Glucose reference range applies only to samples taken after fasting for at least 8 hours.  ?Basic metabolic panel     Status: Abnormal  ? Collection Time: 08/07/21  6:14 AM  ?Result Value Ref Range  ? Sodium 135 135 - 145 mmol/L  ? Potassium 4.1 3.5 - 5.1 mmol/L  ? Chloride 97 (L) 98 - 111 mmol/L  ? CO2 24 22 - 32 mmol/L  ? Glucose, Bld 64 (L) 70 - 99 mg/dL  ?  Comment: Glucose reference range applies only to samples taken after fasting for at least 8 hours.  ? BUN 24 (H) 6 - 20 mg/dL  ? Creatinine, Ser 10.11 (H) 0.44 - 1.00 mg/dL  ? Calcium 7.8 (L) 8.9 - 10.3 mg/dL  ? GFR, Estimated 4 (L) >60 mL/min  ?  Comment: (NOTE) ?Calculated using the CKD-EPI Creatinine Equation (2021) ?  ? Anion gap 14 5 - 15  ?  Comment: Performed at [facility], [place] [address], [place], [place] [address]  ?Heparin level (unfractionated)     Status: None  ? Collection  Time: 08/07/21  6:14 AM  ?Result Value Ref Range  ? Heparin Unfractionated 0.31 0.30 - 0.70 IU/mL  ?  Comment: (NOTE) ?The clinical reportable range upper limit is being lowered to >1.10 ?to align with the

## 2021-08-07 NOTE — Progress Notes (Signed)
[facility] [place] ? ?Subjective  - Itching this morning ? ? ?Objective ?(!) 92/43 ?79 ?99.8 ?F (37.7 ?C) (Oral) ?18 ?100% ? ?Intake/Output Summary (Last 24 hours) at 08/07/2021 0946 ?Last data filed at 08/07/2021 0600 ?Gross per 24 hour  ?Intake 798.63 ml  ?Output 2297 ml  ?Net -1498.37 ml  ? ?Pic 5/2 ? ? ? ?Laboratory ?Lab Results: ?Recent Labs  ?  08/06/21 ?1956 08/07/21 ?5176  ?WBC 8.5 7.4  ?HGB 7.8* 8.1*  ?HCT 28.7* 28.3*  ?PLT 178 219  ? ? ?BMET ?Recent Labs  ?  08/06/21 ?1707 08/07/21 ?0614  ?NA 138 135  ?K 3.9 4.1  ?CL 99 97*  ?CO2 27 24  ?GLUCOSE 72 64*  ?BUN 19 24*  ?CREATININE 8.49* 10.11*  ?CALCIUM 8.1* 7.8*  ? ? ? ?COAG ?Lab Results  ?Component Value Date  ? INR 1.3 (H) 07/10/2021  ? ?No results found for: PTT ? ?Assessment/Planning: ? ?51 year old female that vascular surgery was consulted for right foot ischemia with gangrene as pictured above.   ? ?Patient with Rutherford 5 critical limb ischemia in the right leg.  She would benefit from right lower extremity angiography in an effort to define and improve distal perfusion for wound healing. ? ?Prior to angiography today, I had a long discussion with her regarding the case, including risks and benefits.  She was most worried she would not be able to lie flat.  We have placed her on a wedge in an effort to keep her head slightly elevated.  Furthermore, she is complaining of itching.  She initially wanted to cancel her case, however elected to proceed after hearing that I would not be able to do her case later today and that she would have to be rescheduled either Thursday or Friday.   ? ?Veronica's morbid obesity places her in a high risk category for angiography, especially as the common femoral artery must be accessed.  I discussed with her that I would terminate the case should any portion be deemed unsafe, or if she became able to remain still. ? ?Overall the above conversation with Idaly was difficult.  All questions she  asked were accusatory.  I told Verdene Lennert that I was happy to take care of her and my first priority was her safety.  I appreciate hospital medicine bringing podiatry on board, as she will likely require amputations. ? ? ?Broadus John ?08/07/2021 ?9:46 AM ?-- ? ? ?

## 2021-08-07 NOTE — Care Management Important Message (Signed)
Important Message ? ?Patient Details  ?Name: Courtney Gray ?MRN: [id number] ?Date of Birth: [birth year] ? ? ?Medicare Important Message Given:  Yes ? ? ? ? ?Vonne Mcdanel ?08/07/2021, 3:45 PM ?

## 2021-08-07 NOTE — Op Note (Signed)
? ? ?Patient name: Courtney Gray MRN: [id number] DOB: [birth year] Sex: female ? ?08/07/2021 ?Pre-operative Diagnosis: Right lower extremity Rutherford 5 critical limb ischemia ?Post-operative diagnosis:  Same ?Surgeon:  Broadus John, MD ?Procedure Performed: ?1.  Ultrasound-guided micropuncture access of the left common femoral artery ?2.  Aortogram ?3.  Second-order cannulation, right lower extremity angiogram ?4.  Left lower extremity angiogram ?5.  Device assisted closure-Pro-glide ?6.  Contrast volume 132 ?7.  Sedation 40 minutes ? ? ?Indications:   ?51 year old female that vascular surgery was consulted for right foot ischemia with gangrene as pictured above.   ?Patient with Rutherford 5 critical limb ischemia in the right leg.  She would benefit from right lower extremity angiography in an effort to define and improve distal perfusion for wound healing. ?Prior to angiography today, I had a long discussion with her regarding the case, including risks and benefits. She was most worried she would not be able to lie flat.  We have placed her on a wedge in an effort to keep her head slightly elevated.  Furthermore, she is complaining of itching.  She initially wanted to cancel her case, however elected to proceed after hearing that I would not be able to do her case later today and that she would have to be rescheduled either Thursday or Friday.   ?Veronica's morbid obesity places her in a high risk category for angiography, especially as the common femoral artery must be accessed.  I discussed with her that I would terminate the case should any portion be deemed unsafe, or if she became able to remain still. ?Overall the above conversation with Christia was difficult.  All questions she asked were accusatory.  I told Verdene Lennert that I was happy to take care of her and my first priority was her safety.  I appreciate hospital medicine bringing podiatry on board, as she will likely require amputations. ? ?Findings:   ?Aortogram: Bilateral renal arteries not appreciated, aortoiliac system widely patent ? ?On the right: Severe calcific disease throughout.  No flow-limiting stenosis appreciated in the common femoral, profunda, superficial femoral, popliteal arteries.  The posterior tibial artery is occluded, peroneal artery is patent to the mid tibia, where it occludes.  There is single-vessel anterior tibial runoff to the ankle with flush occlusion.  The anterior tibial artery has diffuse disease throughout.  Flow into the foot is through collaterals. ? ?The left: Severe calcific disease throughout.  No flow-limiting stenosis appreciated in the common femoral, profunda, superficial femoral, popliteal arteries.  The anterior tibial artery is patent for 4 cm prior to occlusion.  The tibioperoneal trunk is patent with single-vessel runoff being the peroneal artery.  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  Peroneal artery provides runoff to the foot via collaterals. ?  ?Procedure:  The patient was identified in the holding area and taken to room 8.  The patient was then placed supine on the table and prepped and draped in the usual sterile fashion.  A time out was called.  Ultrasound was used to evaluate the left common femoral artery.  It was patent .  A digital ultrasound image was acquired.  A micropuncture needle was used to access the left common femoral artery under ultrasound guidance.  An 018 wire was advanced without resistance and a micropuncture sheath was placed.  The 018 wire was removed and a benson wire was placed.  The micropuncture  sheath was exchanged for a 5 french sheath.  An omniflush catheter was advanced over the wire to the level of L-1.  An abdominal angiogram was obtained.  Next, using the omniflush catheter and a benson wire, the aortic bifurcation was crossed and the catheter was placed into  theright external iliac artery and right runoff was obtained.  left runoff was performed via retrograde sheath injections. ? ?Impression: Severe calcific disease in bilateral lower extremities with single-vessel runoff to the level of the ankle.  This is via the anterior tibial artery on the right, and peroneal artery on the left.  Bilateral feet fill through collaterals. Patient is maximally revascularized on the right. She unfortunately does not have any endovascular or open surgical options to improve perfusion. ? ? ?J. Melene Muller, MD ?[facility] ?Office: [phone] ? ? ? ?

## 2021-08-07 NOTE — Progress Notes (Signed)
Patient arrived to 4E from Cath lab. Vitals taken and stable. Patient placed on tele and CCMD notified. Left groin site assessed and level 0. Right foot assessed. Patient oriented to unit. Call bell within reach. Family at the bedside.  ?Courtney Gray  ?

## 2021-08-07 NOTE — Progress Notes (Signed)
Hypoglycemic Event ? ?CBG: 65 ? ?Treatment: D50 25 mL (12.5 gm) ? ?Symptoms: None ? ?Follow-up CBG: [id number] CBG Result:104 ? ?Possible Reasons for Event: Inadequate meal intake ? ?Comments/MD notified: Patient NPO for procedure, Dr. Starla Link notified and was at bedside. ? ? ? ?Vira Agar ? ? ?

## 2021-08-07 NOTE — Progress Notes (Signed)
?   08/07/21 1005  ?Clinical Encounter Type  ?Visited With Patient not available;Health care provider  ?Visit Type Pre-op;Initial  ?Referral From Nurse ?(Raven I. Judeth Horn, RN)  ?Consult/Referral To Chaplain  ? ?Attempted Spiritual Care Consultation: Request had just changed from 4E to [place]. ?Spoke with nurse in [place], patient is already in Procedure Room. ?Not sure if she will be going to 4E or 60M following surgery. ? ?

## 2021-08-08 ENCOUNTER — Encounter (HOSPITAL_COMMUNITY): Payer: 59

## 2021-08-08 DIAGNOSIS — M79673 Pain in unspecified foot: Secondary | ICD-10-CM | POA: Diagnosis not present

## 2021-08-08 DIAGNOSIS — I998 Other disorder of circulatory system: Secondary | ICD-10-CM | POA: Diagnosis not present

## 2021-08-08 DIAGNOSIS — N186 End stage renal disease: Secondary | ICD-10-CM | POA: Diagnosis not present

## 2021-08-08 DIAGNOSIS — I96 Gangrene, not elsewhere classified: Secondary | ICD-10-CM

## 2021-08-08 DIAGNOSIS — J9611 Chronic respiratory failure with hypoxia: Secondary | ICD-10-CM | POA: Diagnosis not present

## 2021-08-08 LAB — RENAL FUNCTION PANEL
Albumin: 2.9 g/dL — ABNORMAL LOW (ref 3.5–5.0)
Anion gap: 15 (ref 5–15)
BUN: 32 mg/dL — ABNORMAL HIGH (ref 6–20)
CO2: 22 mmol/L (ref 22–32)
Calcium: 7.7 mg/dL — ABNORMAL LOW (ref 8.9–10.3)
Chloride: 97 mmol/L — ABNORMAL LOW (ref 98–111)
Creatinine, Ser: 11.66 mg/dL — ABNORMAL HIGH (ref 0.44–1.00)
GFR, Estimated: 4 mL/min — ABNORMAL LOW (ref >60)
Glucose, Bld: 82 mg/dL (ref 70–99)
Phosphorus: 5.2 mg/dL — ABNORMAL HIGH (ref 2.5–4.6)
Potassium: 4.4 mmol/L (ref 3.5–5.1)
Sodium: 134 mmol/L — ABNORMAL LOW (ref 135–145)

## 2021-08-08 LAB — GLUCOSE, CAPILLARY
Glucose-Capillary: 78 mg/dL (ref 70–99)
Glucose-Capillary: 85 mg/dL (ref 70–99)
Glucose-Capillary: 66 mg/dL — ABNORMAL LOW (ref 70–99)
Glucose-Capillary: 94 mg/dL (ref 70–99)
Glucose-Capillary: 92 mg/dL (ref 70–99)

## 2021-08-08 LAB — CBC
HCT: 26.4 % — ABNORMAL LOW (ref 36.0–46.0)
Hemoglobin: 7.8 g/dL — ABNORMAL LOW (ref 12.0–15.0)
MCH: 29.1 pg (ref 26.0–34.0)
MCHC: 29.5 g/dL — ABNORMAL LOW (ref 30.0–36.0)
MCV: 98.5 fL (ref 80.0–100.0)
Platelets: 211 10*3/uL (ref 150–400)
RBC: 2.68 MIL/uL — ABNORMAL LOW (ref 3.87–5.11)
RDW: 18.3 % — ABNORMAL HIGH (ref 11.5–15.5)
WBC: 7 10*3/uL (ref 4.0–10.5)
nRBC: 0 % (ref 0.0–0.2)

## 2021-08-08 LAB — LIPID PANEL
Cholesterol: 72 mg/dL (ref 0–200)
HDL: 26 mg/dL — ABNORMAL LOW (ref >40)
LDL Cholesterol: 23 mg/dL (ref 0–99)
Total CHOL/HDL Ratio: 2.8 RATIO
Triglycerides: 114 mg/dL (ref <150)
VLDL: 23 mg/dL (ref 0–40)

## 2021-08-08 MED ORDER — HEPARIN SODIUM (PORCINE) 5000 UNIT/ML IJ SOLN
5000.0000 [IU] | Freq: Three times a day (TID) | INTRAMUSCULAR | Status: DC
  Administered 2021-08-08 – 2021-08-14 (×18): 5000 [IU] via SUBCUTANEOUS
  Filled 2021-08-08 (×15): qty 1

## 2021-08-08 MED ORDER — DARBEPOETIN ALFA 200 MCG/0.4ML IJ SOSY
200.0000 ug | PREFILLED_SYRINGE | INTRAMUSCULAR | Status: DC
  Administered 2021-08-10: 200 ug via INTRAVENOUS
  Filled 2021-08-08 (×2): qty 0.4

## 2021-08-08 MED ORDER — CEFAZOLIN SODIUM-DEXTROSE 2-4 GM/100ML-% IV SOLN
2.0000 g | Freq: Once | INTRAVENOUS | Status: AC
  Administered 2021-08-08: 2 g via INTRAVENOUS
  Filled 2021-08-08: qty 100

## 2021-08-08 MED ORDER — CEFAZOLIN SODIUM-DEXTROSE 2-4 GM/100ML-% IV SOLN
2.0000 g | INTRAVENOUS | Status: DC
  Administered 2021-08-10 – 2021-08-13 (×2): 2 g via INTRAVENOUS
  Filled 2021-08-08 (×3): qty 100

## 2021-08-08 NOTE — Plan of Care (Signed)
?  Problem: Health Behavior/Discharge Planning: ?Goal: Ability to manage health-related needs will improve ?Outcome: Progressing ?  ?Problem: Clinical Measurements: ?Goal: Ability to maintain clinical measurements within normal limits will improve ?Outcome: Progressing ?Goal: Will remain free from infection ?Outcome: Progressing ?Goal: Diagnostic test results will improve ?Outcome: Progressing ?Goal: Respiratory complications will improve ?Outcome: Progressing ?Goal: Cardiovascular complication will be avoided ?Outcome: Progressing ?  ?Problem: Activity: ?Goal: Risk for activity intolerance will decrease ?Outcome: Progressing ?  ?Problem: Nutrition: ?Goal: Adequate nutrition will be maintained ?Outcome: Progressing ?  ?Problem: Coping: ?Goal: Level of anxiety will decrease ?Outcome: Progressing ?  ?Problem: Elimination: ?Goal: Will not experience complications related to bowel motility ?Outcome: Progressing ?Goal: Will not experience complications related to urinary retention ?Outcome: Progressing ?  ?Problem: Pain Managment: ?Goal: General experience of comfort will improve ?Outcome: Progressing ?  ?Problem: Safety: ?Goal: Ability to remain free from injury will improve ?Outcome: Progressing ?  ?Problem: Skin Integrity: ?Goal: Risk for impaired skin integrity will decrease ?Outcome: Progressing ?  ?Problem: Education: ?Goal: Knowledge of disease and its progression will improve ?Outcome: Progressing ?  ?Problem: Fluid Volume: ?Goal: Compliance with measures to maintain balanced fluid volume will improve ?Outcome: Progressing ?  ?Problem: Health Behavior/Discharge Planning: ?Goal: Ability to manage health-related needs will improve ?Outcome: Progressing ?  ?Problem: Nutritional: ?Goal: Ability to make healthy dietary choices will improve ?Outcome: Progressing ?  ?Problem: Clinical Measurements: ?Goal: Complications related to the disease process, condition or treatment will be avoided or minimized ?Outcome:  Progressing ?  ?

## 2021-08-08 NOTE — Progress Notes (Signed)
Responded to page to provided prayer and emotional support. Daughter at bedside supporting Mother. Chaplain available as needed. ? ?Courtney Gray, [facility], Pager [phone]  ?

## 2021-08-08 NOTE — Progress Notes (Signed)
?  Subjective:  ?Patient ID: Courtney Gray, female    DOB: [birth year],  MRN: [id number] ? ?Patient was seen at bedside with her daughter for most of our discussion today.  She still having quite a bit of pain.  Not in a good mood and upset about her prospects at saving the foot and leg ? ?Negative for chest pain and shortness of breath ?Review of all other systems is negative ?Objective:  ? ?Vitals:  ? 08/08/21 1853 08/08/21 1943  ?BP: (!) 123/111 (!) 151/86  ?Pulse:    ?Resp: 18   ?Temp:  100.3 ?F (37.9 ?C)  ?SpO2:    ? ?General AA&O x3. Normal mood and affect.  ?Vascular Nonpalpable pedal pulses, the distal forefoot is cool to touch  ?Neurologic Epicritic sensation grossly intact.  ?Dermatologic Increasing dysvascular necrosis, no wet gangrene developing  ?Orthopedic: Muscle intact she is having pain in the toes  ? ? ?Assessment & Plan:  ?Patient was evaluated and treated and all questions answered. ? ?Critical limb ischemia with gangrene ?-I had a long discussion today with the patient and her daughter.  We discussed the results of the angiography and prospects at saving the toes.  I think with the increasing necrosis she likely would not be able to save the toes alone, the only in foot amputation would be transmet or Chopart level.  I discussed with her that my opinion this likely has a very low chance of success.  We discussed the risk of further amputation of this versus a BKA now.  We also discussed the morbidity and mortality of such amputations.  Mostly I tried to listen and empathize with her situation, processing such a rapidly developing issue for her foot has been difficult to deal with understandably from a psychological and emotional standpoint.  She is frustrated and felt like her previous foot pain leading up to this week was ignored and misdiagnosed.  She needs more time to think and decide.  Discussed with her that nothing that would increase urgency for decision would be worsening infection which  I currently do not see.  I will meet with her again tomorrow and see how she is doing, encouraged her and her daughter to let me know if there is any questions I can answer for her.   ? ?Criselda Peaches, DPM ? ?Accessible via secure chat for questions or concerns. ? ?

## 2021-08-08 NOTE — Progress Notes (Signed)
Pharmacy Antibiotic Note ? ?Courtney Gray is a 51 y.o. female  with ischemic R foot s/p angiogram with no options for intervention. Pharmacy has been consulted for ancef dosing for R foot gangrene. She is noted with ESRD on HD TTS.  ? ? ? ?Plan: ?Ancef 2gm IV TTS with HD ?Will follow plans for length of therapy ? ? ?Height: 5\' 5"  (165.1 cm) ?Weight: (!) 168.2 kg (370 lb 13 oz) ?IBW/kg (Calculated) : 57 ? ?Temp (24hrs), Avg:98.3 ?F (36.8 ?C), Min:97.9 ?F (36.6 ?C), Max:98.5 ?F (36.9 ?C) ? ?Recent Labs  ?Lab 08/04/21 ?7035 08/05/21 ?0093 08/06/21 ?1707 08/06/21 ?1956 08/07/21 ?8182 08/08/21 ?9937  ?WBC 10.1 7.0  --  8.5 7.4 7.0  ?CREATININE 13.97* 10.76* 8.49*  --  10.11* 11.66*  ?  ?Estimated Creatinine Clearance: 9.1 mL/min (A) (by C-G formula based on SCr of 11.66 mg/dL (H)).   ? ?Allergies  ?Allergen Reactions  ? Benzonatate Other (See Comments)  ?  Hallucinations  ? Hydralazine Other (See Comments)  ?  Hallucinations  ? Amoxicillin Nausea And Vomiting  ? Clonidine Other (See Comments)  ?  Altered mental state  ? Chlorhexidine Itching  ? Morphine Itching  ? Oxycodone Itching  ?  Pt tolerated hospital admission 07/2021  ? ? ?Antimicrobials this admission: ?5/4 ancef>> ? ? ? ?Thank you for allowing pharmacy to be a part of this patient?s care. ? ?Hildred Laser, PharmD ?Clinical Pharmacist ?**Pharmacist phone directory can now be found on [email or web address] ([email or web address]).  Listed under [place]. ? ? ?

## 2021-08-08 NOTE — Progress Notes (Signed)
PT Cancellation Note ? ?Patient Details ?Name: Courtney Gray ?MRN: [id number] ?DOB: [birth year] ? ? ?Cancelled Treatment:    Reason Eval/Treat Not Completed: (P) Patient at procedure or test/unavailable (at HD dept). Will continue efforts per PT plan of care as schedule permits. ? ? ?Kara Pacer Sevan Mcbroom ?08/08/2021, 9:52 AM ? ? ?

## 2021-08-08 NOTE — Progress Notes (Signed)
?[facility] ?Progress Note  ? ?Subjective:  ?Seen in HD unit. Treatment just getting started. Attempting 3L UF. No new events overnight. Feels "burning" in her foot. She feels depressed, wants to talk to someone.  ? ?Objective ?Vitals:  ? 08/08/21 0830 08/08/21 0844 08/08/21 0850 08/08/21 0900  ?BP:  (!) 116/40 121/69 (!) 138/35  ?Pulse:  73 73 73  ?Resp:  14 (!) 24 20  ?Temp: 98.5 ?F (36.9 ?C)     ?TempSrc: Oral     ?SpO2:    99%  ?Weight: (!) 161.5 kg     ?Height:      ?  ? ?Additional Objective ?Labs: ?Basic Metabolic Panel: ?Recent Labs  ?Lab 08/04/21 ?9767 08/05/21 ?3419 08/06/21 ?1707 08/07/21 ?3790 08/08/21 ?2409  ?NA 137   < > 138 135 134*  ?K 3.7   < > 3.9 4.1 4.4  ?CL 98   < > 99 97* 97*  ?CO2 24   < > 27 24 22   ?GLUCOSE 108*   < > 72 64* 82  ?BUN 57*   < > 19 24* 32*  ?CREATININE 13.97*   < > 8.49* 10.11* 11.66*  ?CALCIUM 8.0*   < > 8.1* 7.8* 7.7*  ?PHOS 6.5*  --   --   --  5.2*  ? < > = values in this interval not displayed.  ? ? ?CBC: ?Recent Labs  ?Lab 08/03/21 ?1217 08/04/21 ?7353 08/05/21 ?2992 08/06/21 ?1956 08/07/21 ?4268 08/08/21 ?3419  ?WBC 10.0 10.1 7.0 8.5 7.4 7.0  ?NEUTROABS 7.8*  --   --   --   --   --   ?HGB 7.8* 7.8* 8.0* 7.8* 8.1* 7.8*  ?HCT 28.0* 28.2* 28.0* 28.7* 28.3* 26.4*  ?MCV 102.9* 100.4* 100.4* 102.1* 100.4* 98.5  ?PLT 212 247 221 178 219 211  ? ? ?Blood Culture ?   ?Component Value Date/Time  ? SDES BLOOD LEFT HAND 09/03/2020 2359  ? SPECREQUEST  09/03/2020 2359  ?  BOTTLES DRAWN AEROBIC AND ANAEROBIC Blood Culture adequate volume  ? CULT  09/03/2020 2359  ?  NO GROWTH 5 DAYS ?Performed at [facility], [address], [place], [place] [address] ?  ? REPTSTATUS 09/09/2020 FINAL 09/03/2020 2359  ? ? ? ?Physical Exam ?General: Obese woman, on nasal O2, nad  ?Heart: Distant No m,r,g  ?Lungs: Clear bilaterally  ?Abdomen: soft, non -tender ?Extremities: 1+pitting LE edema bilaterally, dusky toes R foot  ?Dialysis Access: R IJ TDC  ? ?Medications: ? sodium chloride    ?  sodium chloride    ? sodium chloride    ? sodium chloride    ?  ceFAZolin (ANCEF) IV    ? [START ON 08/10/2021]  ceFAZolin (ANCEF) IV    ? ? aspirin EC  81 mg Oral Q breakfast  ? atorvastatin  10 mg Oral QHS  ? calcitRIOL  1 mcg Oral Q T,Th,Sa-HD  ? calcium carbonate  1,500 mg Oral TID with meals  ? calcium carbonate  1.5 tablet Oral With snacks  ? carvedilol  6.25 mg Oral BID WC  ? cinacalcet  30 mg Oral Q T,Th,Sa-HD  ? docusate sodium  100 mg Oral BID  ? gabapentin  100 mg Oral BID  ? heparin injection (subcutaneous)  5,000 Units Subcutaneous Q8H  ? insulin aspart  0-6 Units Subcutaneous TID WC  ? lidocaine  1 patch Transdermal Q24H  ? mupirocin ointment  1 application. Topical BID  ? neomycin-bacitracin-polymyxin   Topical BID  ?  nystatin   Topical TID  ? pantoprazole  40 mg Oral Daily  ? sodium chloride flush  10-40 mL Intracatheter Q12H  ? sodium chloride flush  3 mL Intravenous Q12H  ? sodium chloride flush  3 mL Intravenous Q12H  ? ? ?Dialysis Orders:  ?[name] TTS ? 4h 19min  165kg  1K/2.5Ca bath  TDC  400 BFR ? - 4/29 > HBsAg neg w/ Abs < 10 ? - mircera 200ug q2 ? - calcitriol 1 ug tiw ? - sensipar 30 mg tiw ? ?Assessment/Plan: ?Bilateral foot pain - w/ ischemic changes R foot+ gangrenous toes x 2. VVS consulted. S/p Bilat LE angiogram 5/3 per Dr. Virl Cagey note - severe calcific disease bilaterally. No further vascular options, at risk of limb loss. ?ESRD - on HD TTS.  HD today  ?Volume - BP ok.  Not to EDW yet.  Continue UF as able for volume.  ?HTN - home meds per pmd as needed.  ?Anemia ckd - Hgb 7.8. Redose ESA with HD Sat.  ?MBD ckd - CCa in range and phos up a bit. Cont sensipar and vdra w/ HD tiw. Tums as binder. ?Chronic hypoxic respiratory failure - on home oxygen  ?Depressed mood -At risk of limb loss, chaplain to see for support.  ? ?[name] PA-C ?[facility] ?08/08/2021,9:03 AM ? ? ? ? ? ? ? ?

## 2021-08-08 NOTE — TOC Progression Note (Addendum)
Transition of Care (TOC) - Progression Note  ? ? ?Patient Details  ?Name: Courtney Gray ?MRN: [id number] ?Date of Birth: 1970-12-22 ? ?Transition of Care (TOC) CM/SW Contact  ?Vinie Sill, LCSW ?Phone Number: ?08/08/2021, 1:30 PM ? ?Clinical Narrative:    ? ?Patient has no bed offers at this time-  ? ?[facility]- left voice message w/Melani  ?[place]- left voice message w/Debbie ? ?TOC will continue to follow and seek placement  ? ? ?Thurmond Butts, MSW, LCSW ?Clinical Social Worker ? ? ? ?Expected Discharge Plan: [place] ?Barriers to Discharge: Ship broker, Continued Medical Work up, SNF Pending bed offer ? ?Expected Discharge Plan and Services ?Expected Discharge Plan: [place] ?In-house Referral: Clinical Social Work ?  ?  ?  ?                ?  ?  ?  ?  ?  ?  ?  ?  ?  ?  ? ? ?Social Determinants of Health (SDOH) Interventions ?  ? ?Readmission Risk Interventions ? ?  07/12/2021  ? 12:38 PM 05/23/2021  ?  1:12 PM  ?Readmission Risk Prevention Plan  ?Transportation Screening Complete Complete  ?Medication Review Press photographer) Complete Complete  ?PCP or Specialist appointment within 3-5 days of discharge Complete Complete  ?[place] or Home Care Consult Complete Complete  ?SW Recovery Care/Counseling Consult Complete Complete  ?Palliative Care Screening Not Applicable Not Applicable  ?Skilled Nursing Facility Complete Not Applicable  ? ? ?

## 2021-08-08 NOTE — Progress Notes (Signed)
?PROGRESS NOTE ? ? ? ?Courtney Gray  LPF:[id number] DOB: Sep 07, 1970 DOA: 08/03/2021 ?PCP: [facility], [facility]  ? ?Brief Narrative:  ?51 y.o. female with medical history significant of end-stage renal disease on hemodialysis, gout, diabetes mellitus type II, hypertension who just discharged from rehab this past Monday and was told that she had run out of her days and that she needed to pay out-of-pocket for any further rehab.  She presented again with worsening right foot pain and there was a concern for ischemic foot.  She was admitted and transferred to [facility].  CT angiogram was inconclusive.  She underwent arteriogram on 08/07/2021.  Podiatry following. ? ?Assessment & Plan: ?  ?Ischemic right foot ?-Evidence of gangrenous changes in some of her toes of the right foot. ?-CT angiogram was inconclusive due to significant calcification. ?-Vascular surgery following: Status post arteriogram on 08/07/2021 which showed severe calcific disease in bilateral lower extremities with no endovascular or open surgical options to improve perfusion.  IV heparin subsequently discontinued by vascular surgery. ?-Podiatry also following.  Recommended to start IV Ancef.  Patient will need surgical intervention at some point. ? ?End-stage renal disease on hemodialysis ?-Nephrology following.  Dialysis as per nephrology schedule ? ?Chronic respiratory failure with hypoxia ?-Chronically on 3 L oxygen via nasal cannula.  Respiratory status currently stable ? ?Diabetes mellitus type 2 with hypoglycemia ?-A1c 5.7. ?-Still has intermittent episodes of hypoglycemia.  Continue CBGs. ?-Glipizide and Lantus were discontinued during last hospitalization because of episodes of hypoglycemia ? ?Essential hypertension ?-Blood pressure intermittently on the lower side.  Currently on lower dose of Coreg.  Amlodipine on hold ? ?Anemia of chronic kidney disease ?Macrocytosis ?-Hemoglobin currently stable.  Transfuse if hemoglobin is less than  7 ? ?Hyperlipidemia ?-continue statin ? ?Hypersomnolence ?-Likely due to combination of illness and medications.  Gabapentin dose has been decreased.  Judicious use of narcotics ? ?History of hemorrhoids ?-Stable ? ?Questionable history of gout ?-apparently treated for gout of her right foot recently ? ?Morbid obesity ?-Outpatient follow-up ? ? ?DVT prophylaxis: Subcutaneous heparin  ?code Status: Full ?Family Communication: None at bedside ?Disposition Plan: ?Status is: Inpatient ?Remains inpatient appropriate because: Of severity of illness ? ? ?Consultants: Vascular surgery/podiatry/nephrology ? ?Procedures: Left lower extremity arteriogram on 08/07/2021 ?Antimicrobials: None ? ? ?Subjective: ?Patient seen and examined at bedside.  Still complains of intermittent lower extremity pain.  No overnight fever, vomiting, agitation reported.  Feels depressed. ?Objective: ?Vitals:  ? 08/07/21 2124 08/07/21 2345 08/08/21 0422 08/08/21 0606  ?BP: (!) 142/65 (!) 108/36 104/70   ?Pulse: 73 74 75   ?Resp: 19 17 12 20   ?Temp:  98.5 ?F (36.9 ?C) 98.4 ?F (36.9 ?C)   ?TempSrc:  Oral Oral   ?SpO2: 100% 98% 97%   ?Weight:      ?Height:      ? ? ?Intake/Output Summary (Last 24 hours) at 08/08/2021 0710 ?Last data filed at 08/08/2021 0100 ?Gross per 24 hour  ?Intake 120 ml  ?Output --  ?Net 120 ml  ? ? ?Filed Weights  ? 08/04/21 2348 08/06/21 0745 08/06/21 1115  ?Weight: (!) 168.2 kg (!) 170.4 kg (!) 168.2 kg  ? ? ?Examination: ? ?General exam: No distress.  Looks chronically ill and deconditioned.  On 3 L oxygen via nasal cannula.   ?Respiratory system: Decreased breath sounds at bases bilaterally with some crackles  ?cardiovascular system: Rate controlled currently; S1-S2 heard  ?gastrointestinal system: Abdomen is still distended; soft and nontender.  Bowel sounds are  heard  ?extremities: Mild lower extremity edema present; no clubbing ?Central nervous system: Awake; still slow to respond.  Poor historian.  No focal neurological  deficits.  Moves extremities  ?skin: Right third and fourth toes with gangrenous changes present ?Psychiatry: Currently not agitated.  Affect is flat. ? ? ?Data Reviewed: I have personally reviewed following labs and imaging studies ? ?CBC: ?Recent Labs  ?Lab 08/03/21 ?1217 08/04/21 ?6629 08/05/21 ?4765 08/06/21 ?1956 08/07/21 ?4650 08/08/21 ?3546  ?WBC 10.0 10.1 7.0 8.5 7.4 7.0  ?NEUTROABS 7.8*  --   --   --   --   --   ?HGB 7.8* 7.8* 8.0* 7.8* 8.1* 7.8*  ?HCT 28.0* 28.2* 28.0* 28.7* 28.3* 26.4*  ?MCV 102.9* 100.4* 100.4* 102.1* 100.4* 98.5  ?PLT 212 247 221 178 219 211  ? ? ?Basic Metabolic Panel: ?Recent Labs  ?Lab 08/03/21 ?1217 08/04/21 ?5681 08/05/21 ?2751 08/06/21 ?1707 08/07/21 ?7001  ?NA 139 137 137 138 135  ?K 4.0 3.7 3.8 3.9 4.1  ?CL 99 98 98 99 97*  ?CO2 24 24 25 27 24   ?GLUCOSE 160* 108* 109* 72 64*  ?BUN 52* 57* 32* 19 24*  ?CREATININE 11.89* 13.97* 10.76* 8.49* 10.11*  ?CALCIUM 7.9* 8.0* 8.2* 8.1* 7.8*  ?PHOS  --  6.5*  --   --   --   ? ? ?GFR: ?Estimated Creatinine Clearance: 10.5 mL/min (A) (by C-G formula based on SCr of 10.11 mg/dL (H)). ?Liver Function Tests: ?Recent Labs  ?Lab 08/03/21 ?1217  ?AST 17  ?ALT 15  ?ALKPHOS 61  ?BILITOT 0.7  ?PROT 7.2  ?ALBUMIN 3.1*  ? ? ?No results for input(s): LIPASE, AMYLASE in the last 168 hours. ?No results for input(s): AMMONIA in the last 168 hours. ?Coagulation Profile: ?No results for input(s): INR, PROTIME in the last 168 hours. ?Cardiac Enzymes: ?Recent Labs  ?Lab 08/03/21 ?1217  ?CKTOTAL 131  ? ? ?BNP (last 3 results) ?No results for input(s): PROBNP in the last 8760 hours. ?HbA1C: ?No results for input(s): HGBA1C in the last 72 hours. ?CBG: ?Recent Labs  ?Lab 08/07/21 ?0812 08/07/21 ?7494 08/07/21 ?1745 08/07/21 ?2110 08/08/21 ?0612  ?GLUCAP 65* 104* 72 93 78  ? ? ?Lipid Profile: ?No results for input(s): CHOL, HDL, LDLCALC, TRIG, CHOLHDL, LDLDIRECT in the last 72 hours. ?Thyroid Function Tests: ?No results for input(s): TSH, T4TOTAL, FREET4, T3FREE,  THYROIDAB in the last 72 hours. ?Anemia Panel: ?No results for input(s): VITAMINB12, FOLATE, FERRITIN, TIBC, IRON, RETICCTPCT in the last 72 hours. ?Sepsis Labs: ?No results for input(s): PROCALCITON, LATICACIDVEN in the last 168 hours. ? ?No results found for this or any previous visit (from the past 240 hour(s)).  ? ? ? ? ? ?Radiology Studies: ?PERIPHERAL VASCULAR CATHETERIZATION ? ?Result Date: 08/07/2021 ?Images from the original result were not included. Patient name: Courtney Gray MRN: [id number] DOB: 05-18-1970 Sex: female 08/07/2021 Pre-operative Diagnosis: Right lower extremity Rutherford 5 critical limb ischemia Post-operative diagnosis:  Same Surgeon:  Broadus John, MD Procedure Performed: 1.  Ultrasound-guided micropuncture access of the left common femoral artery 2.  Aortogram 3.  Second-order cannulation, right lower extremity angiogram 4.  Left lower extremity angiogram 5.  Device assisted closure-Pro-glide 6.  Contrast volume 132 7.  Sedation 40 minutes Indications:  50 year old female that vascular surgery was consulted for right foot ischemia with gangrene as pictured above.  Patient with Rutherford 5 critical limb ischemia in the right leg.  She would benefit from right lower extremity angiography in an effort to define  and improve distal perfusion for wound healing. Prior to angiography today, I had a long discussion with her regarding the case, including risks and benefits. She was most worried she would not be able to lie flat.  We have placed her on a wedge in an effort to keep her head slightly elevated.  Furthermore, she is complaining of itching.  She initially wanted to cancel her case, however elected to proceed after hearing that I would not be able to do her case later today and that she would have to be rescheduled either Thursday or Friday.  Veronica's morbid obesity places her in a high risk category for angiography, especially as the common femoral artery must be accessed.  I  discussed with her that I would terminate the case should any portion be deemed unsafe, or if she became able to remain still. Overall the above conversation with Zandria was difficult.  All questions she asked were

## 2021-08-08 NOTE — Progress Notes (Addendum)
removed 1669mls net fluid unable to remove more due to pt signing off tx ama 1.5 hours and hypotension. AMA form in pt chart.   pt had severe pain in toes mid. tx refused Tylenol and dilaudid stating she always hurts like this on dialysis and she wont take pain medicine on dialysis,.  i told her the dilaudid i could give now and then would be available again when she was finished in 3 hours. pt physically crying and wailing on treatment, pt also refused to reposition.  pts bed is broken and ask primary RN to contact whomever to get this issue fixed. unable to obtain accurate weights as well as inability to get the patient comfortable. pt refused to come to dialysis initially because her bp was low (409 systolic) and she wanted to eat first per transporter. alerted both day and night primary RNS. 1 hour later placed pt in transport to give her time to eat.  pt did not eat in that time and was given a breakfast sandwich upon arrival to dialysis. pt expressed she needed to speak to someone about her "mental" as she is sad about her diagnosis and potential treatment options. Relayed this information to primary care team when they visited her in the dialysis unit.   pt tx started shortly after bp improved and stable throughout treatment until 1.5 hours left. lowest record bp was 95 systolic . fluid removal turned off at this time. pre bp 116/40 post bp 95/30 pt keep removing blood pressure cuff and keeping it at wrist due to constantly rubbing foot during tx,  unable to get a weight due to broken bed.  pt states EDW 168.  closest bed came was 161kgs.  gave calcitriol as ordered. gave heparin 3400 units as ordered. catheter positional as patient moving around a lot ran much better at prescribed bfr when lines reversed.  ?

## 2021-08-08 NOTE — Progress Notes (Signed)
Mobility Specialist Progress Note ? ? 08/08/21 1629  ?Mobility  ?Activity Dangled on edge of bed  ?Level of Assistance Moderate assist, patient does 50-74%  ?Assistive Device  ?(HHA)  ?Activity Response Tolerated well  ?$Mobility charge 1 Mobility  ? ?Pre Mobility: 86 HR, 97% SpO2 ?During Mobility: 89 HR, 91% SpO2 ?Post Mobility: 74 HR, 96% SpO2 ? ?Session limited to pain tolerance today. Received laying on side of bed w/ foot propped on chair. Pt c/o of BLE pain(8/10) and pain in middle digit on L hand. Pt agreed to practice standing. Able to dangle at EOB but gravity to LE increased pain and pt requesting to lay back down. Left supine in bed w/ IV team in room.    ? ?Holland Falling ?Mobility Specialist ?Phone Number [phone] ? ?

## 2021-08-08 NOTE — Progress Notes (Addendum)
[facility] [place] ? ?Subjective  - No new complaints groins and intervention site ? ? ?Objective ?104/70 ?75 ?98.4 ?F (36.9 ?C) (Oral) ?20 ?97% ? ?Intake/Output Summary (Last 24 hours) at 08/08/2021 0831 ?Last data filed at 08/08/2021 0100 ?Gross per 24 hour  ?Intake 120 ml  ?Output --  ?Net 120 ml  ? ? ?Sitting up in bed in HD center ?Denies pain in left groin area, no sensation of edema ?Lungs no labored breathing ?General no acute distress ? 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  There is single-vessel anterior tibial runoff to the ankle with flush occlusion.  The anterior tibial artery has diffuse disease throughout.  Flow into the foot is through collaterals. ? ?Left LE Severe calcific disease throughout.  No flow-limiting stenosis appreciated in the common femoral, profunda, superficial femoral, popliteal arteries.  The anterior tibial artery is patent for 4 cm prior to occlusion.  The tibioperoneal trunk is patent with single-vessel runoff being the peroneal artery.  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  Peroneal artery provides runoff to the foot via collaterals. ? ?The patient has been maximally vascularized.  There are no endovascular or open surgical options to improve perfusion. ?Wound care management per DPM ?She is at risk of toe/limb loss. ?No further vascular options. ? ?Roxy Horseman ?08/08/2021 ?8:31 AM ?-- ? ?Laboratory ?Lab Results: ?Recent Labs  ?  08/07/21 ?3545 08/08/21 ?0620  ?WBC 7.4 7.0  ?HGB 8.1* 7.8*  ?HCT 28.3* 26.4*  ?PLT 219 211  ? ?BMET ?Recent Labs  ?  08/07/21 ?0614 08/08/21 ?0620  ?NA 135 134*  ?K 4.1 4.4  ?CL 97* 97*  ?CO2 24  22  ?GLUCOSE 64* 82  ?BUN 24* 32*  ?CREATININE 10.11* 11.66*  ?CALCIUM 7.8* 7.7*  ? ? ?COAG ?Lab Results  ?Component Value Date  ? INR 1.3 (H) 07/10/2021  ? ?No results found for: PTT ? ? ?I have seen and evaluated the patient. I agree with the PA note as documented above.  Postop day 1 status post right lower extremity arteriogram for tissue loss.  She had right leg runoff to the ankle through the anterior tibial but essentially no outflow into the foot with significant small vessel disease from her diabetes and end-stage renal disease.  Discussed I think she will be high risk for nonhealing toe amputations but her options would be toe amputations versus more proximal amputation like a BKA.  Appreciate podiatry input.  She needs more time to think about this. ? ?Marty Heck, MD ?[facility] ?Office: [phone] ? ?

## 2021-08-09 DIAGNOSIS — I96 Gangrene, not elsewhere classified: Secondary | ICD-10-CM | POA: Diagnosis not present

## 2021-08-09 DIAGNOSIS — I998 Other disorder of circulatory system: Secondary | ICD-10-CM | POA: Diagnosis not present

## 2021-08-09 DIAGNOSIS — M79673 Pain in unspecified foot: Secondary | ICD-10-CM | POA: Diagnosis not present

## 2021-08-09 LAB — BASIC METABOLIC PANEL
Anion gap: 14 (ref 5–15)
BUN: 21 mg/dL — ABNORMAL HIGH (ref 6–20)
CO2: 22 mmol/L (ref 22–32)
Calcium: 7.7 mg/dL — ABNORMAL LOW (ref 8.9–10.3)
Chloride: 99 mmol/L (ref 98–111)
Creatinine, Ser: 9.25 mg/dL — ABNORMAL HIGH (ref 0.44–1.00)
GFR, Estimated: 5 mL/min — ABNORMAL LOW (ref >60)
Glucose, Bld: 96 mg/dL (ref 70–99)
Potassium: 4.2 mmol/L (ref 3.5–5.1)
Sodium: 135 mmol/L (ref 135–145)

## 2021-08-09 LAB — GLUCOSE, CAPILLARY
Glucose-Capillary: 103 mg/dL — ABNORMAL HIGH (ref 70–99)
Glucose-Capillary: 92 mg/dL (ref 70–99)
Glucose-Capillary: 108 mg/dL — ABNORMAL HIGH (ref 70–99)
Glucose-Capillary: 115 mg/dL — ABNORMAL HIGH (ref 70–99)

## 2021-08-09 MED ORDER — ALTEPLASE 2 MG IJ SOLR
2.0000 mg | Freq: Once | INTRAMUSCULAR | Status: DC | PRN
Start: 2021-08-09 — End: 2021-08-13

## 2021-08-09 MED ORDER — SODIUM CHLORIDE 0.9 % IV SOLN: 100.0000 mL | INTRAVENOUS | Status: DC | PRN

## 2021-08-09 MED ORDER — POLYETHYLENE GLYCOL 3350 17 G PO PACK
17.0000 g | PACK | Freq: Every day | ORAL | Status: DC
  Administered 2021-08-09 – 2021-08-24 (×3): 17 g via ORAL
  Filled 2021-08-09 (×11): qty 1

## 2021-08-09 MED ORDER — CHLORHEXIDINE GLUCONATE CLOTH 2 % EX PADS: 6.0000 | MEDICATED_PAD | Freq: Every day | CUTANEOUS | Status: DC

## 2021-08-09 MED ORDER — PENTAFLUOROPROP-TETRAFLUOROETH EX AERO: 1.0000 "application " | INHALATION_SPRAY | CUTANEOUS | Status: DC | PRN

## 2021-08-09 MED ORDER — LIDOCAINE HCL (PF) 1 % IJ SOLN: 5.0000 mL | INTRAMUSCULAR | Status: DC | PRN

## 2021-08-09 MED ORDER — HEPARIN SODIUM (PORCINE) 1000 UNIT/ML DIALYSIS
1000.0000 [IU] | INTRAMUSCULAR | Status: DC | PRN
  Administered 2021-08-13: 1000 [IU] via INTRAVENOUS_CENTRAL
  Filled 2021-08-09 (×3): qty 1

## 2021-08-09 MED ORDER — LIDOCAINE-PRILOCAINE 2.5-2.5 % EX CREA
1.0000 "application " | TOPICAL_CREAM | CUTANEOUS | Status: DC | PRN
  Filled 2021-08-09: qty 5

## 2021-08-09 NOTE — Progress Notes (Signed)
?[facility] ?Progress Note  ? ?Subjective:    ?Seen and examined patient at bedside. Patient's daughter also at bedside. Patient very tearful on current situation with her right foot. Still deciding whether to proceed with amputation. Vascular and Podiatry following. Tolerated yesterday's HD with net UF 1.6L. Plan for HD 08/10/21. ? ?Objective ?Vitals:  ? 08/09/21 0040 08/09/21 0344 08/09/21 0823 08/09/21 1144  ?BP: 98/82 133/84 (!) 164/73 (!) 158/75  ?Pulse: 78 80 80   ?Resp: 14 (!) 21 18 18   ?Temp: 98.4 ?F (36.9 ?C) 97.8 ?F (36.6 ?C) 98.2 ?F (36.8 ?C) 98 ?F (36.7 ?C)  ?TempSrc: Oral Oral Oral   ?SpO2: 90% 90% 92% 96%  ?Weight:      ?Height:      ? ?Physical Exam ?General: Obese woman, on nasal O2, nad  ?Heart: Distant No m,r,g  ?Lungs: Clear bilaterally  ?Abdomen: soft, non -tender ?Extremities: 1+pitting LE edema bilaterally, dusky toes R foot  ?Dialysis Access: R IJ TDC  ? ?Filed Weights  ? 08/06/21 0745 08/06/21 1115 08/08/21 0830  ?Weight: (!) 170.4 kg (!) 168.2 kg (!) 161.5 kg  ? ? ?Intake/Output Summary (Last 24 hours) at 08/09/2021 1251 ?Last data filed at 08/09/2021 7619 ?Gross per 24 hour  ?Intake 480 ml  ?Output --  ?Net 480 ml  ? ? ?Additional Objective ?Labs: ?Basic Metabolic Panel: ?Recent Labs  ?Lab 08/04/21 ?5093 08/05/21 ?2671 08/07/21 ?2458 08/08/21 ?0998 08/09/21 ?3382  ?NA 137   < > 135 134* 135  ?K 3.7   < > 4.1 4.4 4.2  ?CL 98   < > 97* 97* 99  ?CO2 24   < > 24 22 22   ?GLUCOSE 108*   < > 64* 82 96  ?BUN 57*   < > 24* 32* 21*  ?CREATININE 13.97*   < > 10.11* 11.66* 9.25*  ?CALCIUM 8.0*   < > 7.8* 7.7* 7.7*  ?PHOS 6.5*  --   --  5.2*  --   ? < > = values in this interval not displayed.  ? ?Liver Function Tests: ?Recent Labs  ?Lab 08/03/21 ?1217 08/08/21 ?0620  ?AST 17  --   ?ALT 15  --   ?ALKPHOS 61  --   ?BILITOT 0.7  --   ?PROT 7.2  --   ?ALBUMIN 3.1* 2.9*  ? ?No results for input(s): LIPASE, AMYLASE in the last 168 hours. ?CBC: ?Recent Labs  ?Lab 08/03/21 ?1217 08/04/21 ?5053  08/05/21 ?9767 08/06/21 ?1956 08/07/21 ?3419 08/08/21 ?3790  ?WBC 10.0 10.1 7.0 8.5 7.4 7.0  ?NEUTROABS 7.8*  --   --   --   --   --   ?HGB 7.8* 7.8* 8.0* 7.8* 8.1* 7.8*  ?HCT 28.0* 28.2* 28.0* 28.7* 28.3* 26.4*  ?MCV 102.9* 100.4* 100.4* 102.1* 100.4* 98.5  ?PLT 212 247 221 178 219 211  ? ?Blood Culture ?   ?Component Value Date/Time  ? SDES BLOOD LEFT HAND 09/03/2020 2359  ? SPECREQUEST  09/03/2020 2359  ?  BOTTLES DRAWN AEROBIC AND ANAEROBIC Blood Culture adequate volume  ? CULT  09/03/2020 2359  ?  NO GROWTH 5 DAYS ?Performed at [facility], [address], [place], [place] [address] ?  ? REPTSTATUS 09/09/2020 FINAL 09/03/2020 2359  ? ? ?Cardiac Enzymes: ?Recent Labs  ?Lab 08/03/21 ?1217  ?CKTOTAL 131  ? ?CBG: ?Recent Labs  ?Lab 08/08/21 ?1723 08/08/21 ?2001 08/08/21 ?2122 08/09/21 ?3532 08/09/21 ?1142  ?GLUCAP 66* 94 92 103* 92  ? ?Iron Studies: No results  for input(s): IRON, TIBC, TRANSFERRIN, FERRITIN in the last 72 hours. ?Lab Results  ?Component Value Date  ? INR 1.3 (H) 07/10/2021  ? ?Studies/Results: ?No results found. ? ?Medications: ? [START ON 08/10/2021]  ceFAZolin (ANCEF) IV    ? ? aspirin EC  81 mg Oral Q breakfast  ? atorvastatin  10 mg Oral QHS  ? calcitRIOL  1 mcg Oral Q T,Th,Sa-HD  ? calcium carbonate  1,500 mg Oral TID with meals  ? calcium carbonate  1.5 tablet Oral With snacks  ? carvedilol  6.25 mg Oral BID WC  ? cinacalcet  30 mg Oral Q T,Th,Sa-HD  ? [START ON 08/10/2021] darbepoetin (ARANESP) injection - DIALYSIS  200 mcg Intravenous Q Sat-HD  ? docusate sodium  100 mg Oral BID  ? gabapentin  100 mg Oral BID  ? heparin injection (subcutaneous)  5,000 Units Subcutaneous Q8H  ? insulin aspart  0-6 Units Subcutaneous TID WC  ? lidocaine  1 patch Transdermal Q24H  ? mupirocin ointment  1 application. Topical BID  ? neomycin-bacitracin-polymyxin   Topical BID  ? nystatin   Topical TID  ? pantoprazole  40 mg Oral Daily  ? polyethylene glycol  17 g Oral Daily  ? sodium chloride flush  10-40 mL  Intracatheter Q12H  ? ? ?Dialysis Orders: ?[name] ? 4h 53min  165kg  1K/2.5Ca bath  TDC  400 BFR ? - 4/29 > HBsAg neg w/ Abs < 10 ? - mircera 200ug q2 ? - calcitriol 1 ug tiw ? - sensipar 30 mg tiw ? ?Assessment/Plan: ?Bilateral foot pain - w/ ischemic changes R foot+ gangrenous toes x 2. VVS consulted. S/p Bilat LE angiogram 5/3 per Dr. Virl Cagey note - severe calcific disease bilaterally. No further vascular options, at risk of limb loss, patient very emotional about this.Marland Kitchen ?ESRD - on HD TTS. Plan for HD 08/10/21.  ?Volume - BP ok.  Not to EDW yet.  Continue UF as able for volume.  ?HTN - home meds per pmd as needed.  ?Anemia ckd - Hgb 7.8. Redose ESA with HD Sat.  ?MBD ckd - CCa in range and phos up a bit. Cont sensipar and vdra w/ HD tiw. Tums as binder. ?Chronic hypoxic respiratory failure - on home oxygen  ?Depressed mood -At risk of limb loss, chaplain to see for support.  ? ?Tobie Poet, NP ?[facility] ?08/09/2021,12:51 PM ? LOS: 5 days  ?  ?

## 2021-08-09 NOTE — Progress Notes (Signed)
?  Subjective:  ?Patient ID: Courtney Gray, female    DOB: 1971-02-03,  MRN: [id number] ? ?Patient was seen at bedside again today. Foot still painful. She is still considering her deicision ? ?Negative for chest pain and shortness of breath ?Review of all other systems is negative ?Objective:  ? ?Vitals:  ? 08/09/21 1641 08/09/21 2026  ?BP: 140/66 95/79  ?Pulse: 81 77  ?Resp: (!) 22 19  ?Temp: 98 ?F (36.7 ?C) (!) 97.5 ?F (36.4 ?C)  ?SpO2: 93% 95%  ? ?General AA&O x3. Normal mood and affect.  ?Vascular Nonpalpable pedal pulses, the distal forefoot is cool to touch  ?Neurologic Epicritic sensation grossly intact.  ?Dermatologic Increasing dysvascular necrosis, no wet gangrene developing. No malodor or purulence  ?Orthopedic: Muscle intact she is having pain in the toes  ? ? ?Assessment & Plan:  ?Patient was evaluated and treated and all questions answered. ? ?Critical limb ischemia with gangrene ?-We again discussed options,  but mostly tried to provide support as best as possible. She has plans for family to visit next week and they will discuss and she will decide then. We will see  on Monday, I am available over weekend if she or family have questions or would like to meet again. ? ?Criselda Peaches, DPM ? ?Accessible via secure chat for questions or concerns. ? ?

## 2021-08-09 NOTE — Progress Notes (Signed)
[facility] [place] ? ?Subjective  -still debating her options for right lower extremity ? ? ?Objective ?133/84 ?80 ?97.8 ?F (36.6 ?C) (Oral) ?(!) 21 ?90% ? ?Intake/Output Summary (Last 24 hours) at 08/09/2021 0721 ?Last data filed at 08/09/2021 7622 ?Gross per 24 hour  ?Intake 480 ml  ?Output 1684 ml  ?Net -1204 ml  ? ? ?Necrotic right second and third toes with gangrene with some early necrosis of the big toe as well ? ?Laboratory ?Lab Results: ?Recent Labs  ?  08/07/21 ?6333 08/08/21 ?0620  ?WBC 7.4 7.0  ?HGB 8.1* 7.8*  ?HCT 28.3* 26.4*  ?PLT 219 211  ? ?BMET ?Recent Labs  ?  08/08/21 ?0620 08/09/21 ?0242  ?NA 134* 135  ?K 4.4 4.2  ?CL 97* 99  ?CO2 22 22  ?GLUCOSE 82 96  ?BUN 32* 21*  ?CREATININE 11.66* 9.25*  ?CALCIUM 7.7* 7.7*  ? ? ?COAG ?Lab Results  ?Component Value Date  ? INR 1.3 (H) 07/10/2021  ? ?No results found for: PTT ? ?Assessment/Planning: ? ?51 year old female that presented with critical limb ischemia with tissue loss of right lower extremity.  She underwent angiogram earlier in the week that showed inline flow through the AT down to the ankle with no outflow into the foot and severe small vessel disease.  Appreciate podiatry's input and they have been discussing TMA versus chopart level amputation with the patient given very high risk this will not heal.  I have also discussed option of more proximal amputation.  She feels she needs more time to think about her options.  Vascular will follow. ? ?Marty Heck ?08/09/2021 ?7:21 AM ?-- ? ? ?

## 2021-08-09 NOTE — Plan of Care (Signed)
?  Problem: Health Behavior/Discharge Planning: ?Goal: Ability to manage health-related needs will improve ?Outcome: Progressing ?  ?Problem: Clinical Measurements: ?Goal: Ability to maintain clinical measurements within normal limits will improve ?Outcome: Progressing ?Goal: Will remain free from infection ?Outcome: Progressing ?Goal: Diagnostic test results will improve ?Outcome: Progressing ?Goal: Respiratory complications will improve ?Outcome: Progressing ?Goal: Cardiovascular complication will be avoided ?Outcome: Progressing ?  ?Problem: Elimination: ?Goal: Will not experience complications related to bowel motility ?Outcome: Progressing ?Goal: Will not experience complications related to urinary retention ?Outcome: Progressing ?  ?Problem: Coping: ?Goal: Level of anxiety will decrease ?Outcome: Progressing ?  ?Problem: Education: ?Goal: Knowledge of disease and its progression will improve ?Outcome: Progressing ?  ?Problem: Skin Integrity: ?Goal: Risk for impaired skin integrity will decrease ?Outcome: Progressing ?  ?

## 2021-08-09 NOTE — Progress Notes (Signed)
Physical Therapy Treatment ?Patient Details ?Name: Courtney Gray ?MRN: [id number] ?DOB: 25-Jul-1970 ?Today's Date: 08/09/2021 ? ? ?History of Present Illness 51 y.o. female who just discharged from rehab this past Monday and was told that she had run out of her days and that she needed to pay out-of-pocket for any further rehab.  She presented again with worsening right foot pain and there was a concern for ischemic foot. Pt also reports she dropped her O2 canister on her R foot.  She was admitted and transferred to [facility].  CT angiogram was inconclusive.  She underwent arteriogram on 08/07/2021. Podiatry discussing TMA versus chopart level amputation with the patient and vascular MD recommending more proximal amputation. She feels she needs more time to think about her options.  PMH: end-stage renal disease on hemodialysis, gout, diabetes mellitus type II, hypertension ? ?  ?PT Comments  ? ? Pt received in supine, agreeable to therapy session with max encouragement after discussion on benefits of mobility/risks of immobility. Pt however adamantly refusing EOB/OOB mobility this date although she reports feeling constipated. Some discussion of safe plan for transfer OOB to [facility] but pt reporting this will have to happen in future sessions as pain today is 10/10 (RN aware). Pt agreeable to limited bed-level LE exercises and repositioning in bed for pressure relief. Plan to progress EOB/OOB transfers next session if pain better controlled. Pt continues to benefit from PT services to progress toward functional mobility goals.   ?Recommendations for follow up therapy are one component of a multi-disciplinary discharge planning process, led by the attending physician.  Recommendations may be updated based on patient status, additional functional criteria and insurance authorization. ? ?Follow Up Recommendations ? Skilled nursing-short term rehab (<3 hours/day) ?  ?  ?Assistance Recommended at Discharge Frequent or  constant Supervision/Assistance  ?Patient can return home with the following A lot of help with walking and/or transfers;Assistance with cooking/housework;Assist for transportation;Help with stairs or ramp for entrance;A little help with bathing/dressing/bathroom ?  ?Equipment Recommendations ? Hospital bed (continue to assess, may need mechanical lift vs slide board pending progress and surgical plan)  ?  ?Recommendations for Other Services   ? ? ?  ?Precautions / Restrictions Precautions ?Precautions: Fall ?Precaution Comments: mod fall ?Restrictions ?Weight Bearing Restrictions: No  ?  ? ?Mobility ? Bed Mobility ?  ?  ?  ?  ?  ?  ?  ?General bed mobility comments: pt refusing but per RN had been able to sit up with limited assist earlier in day. ?  ? ?Transfers ?  ?  ?  ?  ?  ?  ?  ?  ?  ?General transfer comment: not attempted this date due to pain in B feet. Toes black on right foot. Pt reports "I want to get up to [facility] so I can have a BM but I am not doing it now. The pain is too much." ?  ? ?Ambulation/Gait ?  ?  ?  ?  ?  ?  ?  ?General Gait Details: not attempted due to pain ? ? ?Stairs ?  ?  ?  ?  ?  ? ? ?Wheelchair Mobility ?  ? ?Modified Rankin (Stroke Patients Only) ?  ? ? ?  ?Balance   ?  ?  ?  ?  ?  ?  ?  ?  ?  ?  ?  ?  ?  ?  ?  ?  ?  ?  ?  ? ?  ?  Cognition Arousal/Alertness: Awake/alert ?Behavior During Therapy: [facility] for tasks assessed/performed ?Overall Cognitive Status: Within Functional Limits for tasks assessed ?  ?  ?  ?  ?  ?  ?  ?  ?  ?  ?  ?  ?  ?  ?  ?  ?General Comments: anxious regarding pain, self-limiting due to fatigue (she reports poor sleep). ?  ?  ? ?  ?Exercises Other Exercises ?Other Exercises: BLE AROM: quad sets, SAQ, ankle pumps (only on LLE due to pain), hip abduction (LLE only) heel slides (LLE only), glute sets x10 reps ea handout given ? ?  ?General Comments General comments (skin integrity, edema, etc.): VSS on 3L O2 [place] ?  ?  ? ?Pertinent Vitals/Pain Pain Assessment ?Pain  Assessment: 0-10 ?Pain Score: 10-Worst pain ever ?Pain Location: R toes, L knee (pt reports B knee arthritis) ?Pain Descriptors / Indicators: Constant, Numbness, Burning, Sharp, Sore, Tingling ?Pain Intervention(s): Limited activity within patient's tolerance, Monitored during session, Repositioned, Premedicated before session  ? ? ?Home Living   ?  ?  ?  ?  ?  ?  ?  ?  ?  ?   ?  ?Prior Function    ?  ?  ?   ? ?PT Goals (current goals can now be found in the care plan section) Acute Rehab PT Goals ?Patient Stated Goal: decreased pain, increased independence ?PT Goal Formulation: With patient ?Progress towards PT goals: Not progressing toward goals - comment ? ?  ?Frequency ? ? ? Min 2X/week ? ? ? ?  ?PT Plan Current plan remains appropriate  ? ? ?Co-evaluation   ?  ?  ?  ?  ? ?  ?AM-PAC PT "6 Clicks" Mobility   ?Outcome Measure ? Help needed turning from your back to your side while in a flat bed without using bedrails?: A Little ?Help needed moving from lying on your back to sitting on the side of a flat bed without using bedrails?: A Little ?Help needed moving to and from a bed to a chair (including a wheelchair)?: Total ?Help needed standing up from a chair using your arms (e.g., wheelchair or bedside chair)?: Total ?Help needed to walk in hospital room?: Total ?Help needed climbing 3-5 steps with a railing? : Total ?6 Click Score: 10 ? ?  ?End of Session Equipment Utilized During Treatment: Oxygen ?Activity Tolerance: Patient limited by pain;Patient limited by fatigue ?Patient left: in bed;with call bell/phone within reach;with bed alarm set ?Nurse Communication: Mobility status;Other (comment) (pt refusing EOB/OOB mobility) ?PT Visit Diagnosis: Other abnormalities of gait and mobility (R26.89);Muscle weakness (generalized) (M62.81);Difficulty in walking, not elsewhere classified (R26.2);Pain ?Pain - Right/Left:  (R foot/toes> L knee pain) ?Pain - part of body: Ankle and joints of foot;Knee ?  ? ? ?Time:  2248-2500 ?PT Time Calculation (min) (ACUTE ONLY): 15 min ? ?Charges:  $Therapeutic Exercise: 8-22 mins          ?          ? ?Nichelle Renwick P., PTA ?Acute Rehabilitation Services ?Secure Chat Preferred 9a-5:30pm ?Office: [phone]  ? ? ?Kara Pacer Dakari Cregger ?08/09/2021, 7:09 PM ? ?

## 2021-08-09 NOTE — Progress Notes (Signed)
?PROGRESS NOTE ? ? ? ?Courtney Gray  JXB:[id number] DOB: 07/03/1970 DOA: 08/03/2021 ?PCP: Medicine, [facility]  ? ?Brief Narrative:  ?51 y.o. female with medical history significant of end-stage renal disease on hemodialysis, gout, diabetes mellitus type II, hypertension who just discharged from rehab this past Monday and was told that she had run out of her days and that she needed to pay out-of-pocket for any further rehab.  She presented again with worsening right foot pain and there was a concern for ischemic foot.  She was admitted and transferred to [facility].  CT angiogram was inconclusive.  She underwent arteriogram on 08/07/2021.  Podiatry following. ? ?Assessment & Plan: ?  ?Ischemic right foot ?-Evidence of gangrenous changes in some of her toes of the right foot. ?-CT angiogram was inconclusive due to significant calcification. ?-Vascular surgery following: Status post arteriogram on 08/07/2021 which showed severe calcific disease in bilateral lower extremities with no endovascular or open surgical options to improve perfusion.  IV heparin subsequently discontinued by vascular surgery. ?-Podiatry also following.  Recommended to start IV Ancef.  Podiatry has recommended transmet or Chopart level.  Patient very emotional.  She has not made a decision yet.  Vascular following as well. ? ?End-stage renal disease on hemodialysis ?-Nephrology following.  Dialysis as per nephrology schedule ? ?Chronic respiratory failure with hypoxia ?-Chronically on 3 L oxygen via nasal cannula.  Respiratory status currently stable ? ?Diabetes mellitus type 2 with hypoglycemia ?-A1c 5.7. ?-Still has intermittent episodes of hypoglycemia.  Continue CBGs. ?-Glipizide and Lantus were discontinued during last hospitalization because of episodes of hypoglycemia.  Continue SSI. ? ?Essential hypertension ?-Blood pressure intermittently on the lower side.  Currently on lower dose of Coreg.  Amlodipine on hold ? ?Anemia of  chronic kidney disease ?Macrocytosis ?-Hemoglobin currently stable.  Transfuse if hemoglobin is less than 7 ? ?Hyperlipidemia ?-continue statin ? ?Hypersomnolence ?-Likely due to combination of illness and medications.  Gabapentin dose has been decreased.  Judicious use of narcotics ? ?History of hemorrhoids ?-Stable ? ?Questionable history of gout ?-apparently treated for gout of her right foot recently ? ?Morbid obesity ?-Outpatient follow-up ? ? ?DVT prophylaxis: Subcutaneous heparin  ?code Status: Full ?Family Communication: Daughter at bedside. ?Disposition Plan: ?Status is: Inpatient ?Remains inpatient appropriate because: Of severity of illness ? ? ?Consultants: Vascular surgery/podiatry/nephrology ? ?Procedures: Left lower extremity arteriogram on 08/07/2021 ?Antimicrobials: None ? ? ?Subjective: ? ?Patient seen and examined.  Sleeping.  Daughter also sleeping next to her.  She complains of right foot pain.  When I asked her if she has had made any decision regarding her surgery, she says " no, not yet, 1 day is not enough, I have lived with my leg for 51 years and you tell me that you were going to cut it and want me to make a decision within 1 day".  ?She was reassured that we are not rushing but medically asking and that she can take as much time as she wants to make a decision. ? ?Objective: ?Vitals:  ? 08/08/21 1943 08/09/21 0040 08/09/21 0344 08/09/21 1308  ?BP: (!) 151/86 98/82 133/84 (!) 164/73  ?Pulse:  78 80 80  ?Resp:  14 (!) 21 18  ?Temp: 100.3 ?F (37.9 ?C) 98.4 ?F (36.9 ?C) 97.8 ?F (36.6 ?C) 98.2 ?F (36.8 ?C)  ?TempSrc: Oral Oral Oral Oral  ?SpO2:  90% 90% 92%  ?Weight:      ?Height:      ? ? ?Intake/Output Summary (Last 24  hours) at 08/09/2021 1017 ?Last data filed at 08/09/2021 6301 ?Gross per 24 hour  ?Intake 480 ml  ?Output 1684 ml  ?Net -1204 ml  ? ? ?Filed Weights  ? 08/06/21 0745 08/06/21 1115 08/08/21 0830  ?Weight: (!) 170.4 kg (!) 168.2 kg (!) 161.5 kg  ? ? ?Examination: ? ?General exam:  Appears calm and comfortable, morbidly obese ?Respiratory system: Clear to auscultation. Respiratory effort normal. ?Cardiovascular system: S1 & S2 heard, RRR. No JVD, murmurs, rubs, gallops or clicks. No pedal edema. ?Gastrointestinal system: Abdomen is nondistended, soft and nontender. No organomegaly or masses felt. Normal bowel sounds heard. ?Central nervous system: Alert and oriented. No focal neurological deficits. ?Extremities: Symmetric 5 x 5 power. ?Skin: Dry gangrene right third and fourth toe. ? ? ? ?Data Reviewed: I have personally reviewed following labs and imaging studies ? ?CBC: ?Recent Labs  ?Lab 08/03/21 ?1217 08/04/21 ?6010 08/05/21 ?9323 08/06/21 ?1956 08/07/21 ?5573 08/08/21 ?2202  ?WBC 10.0 10.1 7.0 8.5 7.4 7.0  ?NEUTROABS 7.8*  --   --   --   --   --   ?HGB 7.8* 7.8* 8.0* 7.8* 8.1* 7.8*  ?HCT 28.0* 28.2* 28.0* 28.7* 28.3* 26.4*  ?MCV 102.9* 100.4* 100.4* 102.1* 100.4* 98.5  ?PLT 212 247 221 178 219 211  ? ? ?Basic Metabolic Panel: ?Recent Labs  ?Lab 08/04/21 ?5427 08/05/21 ?0623 08/06/21 ?1707 08/07/21 ?7628 08/08/21 ?3151 08/09/21 ?7616  ?NA 137 137 138 135 134* 135  ?K 3.7 3.8 3.9 4.1 4.4 4.2  ?CL 98 98 99 97* 97* 99  ?CO2 24 25 27 24 22 22   ?GLUCOSE 108* 109* 72 64* 82 96  ?BUN 57* 32* 19 24* 32* 21*  ?CREATININE 13.97* 10.76* 8.49* 10.11* 11.66* 9.25*  ?CALCIUM 8.0* 8.2* 8.1* 7.8* 7.7* 7.7*  ?PHOS 6.5*  --   --   --  5.2*  --   ? ? ?GFR: ?Estimated Creatinine Clearance: 11.2 mL/min (A) (by C-G formula based on SCr of 9.25 mg/dL (H)). ?Liver Function Tests: ?Recent Labs  ?Lab 08/03/21 ?1217 08/08/21 ?0620  ?AST 17  --   ?ALT 15  --   ?ALKPHOS 61  --   ?BILITOT 0.7  --   ?PROT 7.2  --   ?ALBUMIN 3.1* 2.9*  ? ? ?No results for input(s): LIPASE, AMYLASE in the last 168 hours. ?No results for input(s): AMMONIA in the last 168 hours. ?Coagulation Profile: ?No results for input(s): INR, PROTIME in the last 168 hours. ?Cardiac Enzymes: ?Recent Labs  ?Lab 08/03/21 ?1217  ?CKTOTAL 131  ? ? ?BNP  (last 3 results) ?No results for input(s): PROBNP in the last 8760 hours. ?HbA1C: ?No results for input(s): HGBA1C in the last 72 hours. ?CBG: ?Recent Labs  ?Lab 08/08/21 ?1251 08/08/21 ?1723 08/08/21 ?2001 08/08/21 ?2122 08/09/21 ?0737  ?GLUCAP 85 66* 94 92 103*  ? ? ?Lipid Profile: ?Recent Labs  ?  08/08/21 ?0620  ?CHOL 72  ?HDL 26*  ?[place] 23  ?TRIG 114  ?CHOLHDL 2.8  ? ?Thyroid Function Tests: ?No results for input(s): TSH, T4TOTAL, FREET4, T3FREE, THYROIDAB in the last 72 hours. ?Anemia Panel: ?No results for input(s): VITAMINB12, FOLATE, FERRITIN, TIBC, IRON, RETICCTPCT in the last 72 hours. ?Sepsis Labs: ?No results for input(s): PROCALCITON, LATICACIDVEN in the last 168 hours. ? ?No results found for this or any previous visit (from the past 240 hour(s)).  ? ? ? ? ? ?Radiology Studies: ?No results found. ? ? ? ? ? ?Scheduled Meds: ? aspirin EC  81  mg Oral Q breakfast  ? atorvastatin  10 mg Oral QHS  ? calcitRIOL  1 mcg Oral Q T,Th,Sa-HD  ? calcium carbonate  1,500 mg Oral TID with meals  ? calcium carbonate  1.5 tablet Oral With snacks  ? carvedilol  6.25 mg Oral BID WC  ? cinacalcet  30 mg Oral Q T,Th,Sa-HD  ? [START ON 08/10/2021] darbepoetin (ARANESP) injection - DIALYSIS  200 mcg Intravenous Q Sat-HD  ? docusate sodium  100 mg Oral BID  ? gabapentin  100 mg Oral BID  ? heparin injection (subcutaneous)  5,000 Units Subcutaneous Q8H  ? insulin aspart  0-6 Units Subcutaneous TID WC  ? lidocaine  1 patch Transdermal Q24H  ? mupirocin ointment  1 application. Topical BID  ? neomycin-bacitracin-polymyxin   Topical BID  ? nystatin   Topical TID  ? pantoprazole  40 mg Oral Daily  ? polyethylene glycol  17 g Oral Daily  ? sodium chloride flush  10-40 mL Intracatheter Q12H  ? ?Continuous Infusions: ? [START ON 08/10/2021]  ceFAZolin (ANCEF) IV    ? ? ?Darliss Cheney, MD ?[facility] ?08/09/2021, 10:17 AM  ? ?

## 2021-08-10 DIAGNOSIS — I998 Other disorder of circulatory system: Secondary | ICD-10-CM | POA: Diagnosis not present

## 2021-08-10 DIAGNOSIS — M79673 Pain in unspecified foot: Secondary | ICD-10-CM | POA: Diagnosis not present

## 2021-08-10 LAB — GLUCOSE, CAPILLARY
Glucose-Capillary: 85 mg/dL (ref 70–99)
Glucose-Capillary: 92 mg/dL (ref 70–99)
Glucose-Capillary: 101 mg/dL — ABNORMAL HIGH (ref 70–99)

## 2021-08-10 LAB — CBC WITH DIFFERENTIAL/PLATELET
Abs Immature Granulocytes: 0.08 10*3/uL — ABNORMAL HIGH (ref 0.00–0.07)
Basophils Absolute: 0.1 10*3/uL (ref 0.0–0.1)
Basophils Relative: 1 %
Eosinophils Absolute: 0.3 10*3/uL (ref 0.0–0.5)
Eosinophils Relative: 3 %
HCT: 26.1 % — ABNORMAL LOW (ref 36.0–46.0)
Hemoglobin: 7.6 g/dL — ABNORMAL LOW (ref 12.0–15.0)
Immature Granulocytes: 1 %
Lymphocytes Relative: 11 %
Lymphs Abs: 1 10*3/uL (ref 0.7–4.0)
MCH: 28.5 pg (ref 26.0–34.0)
MCHC: 29.1 g/dL — ABNORMAL LOW (ref 30.0–36.0)
MCV: 97.8 fL (ref 80.0–100.0)
Monocytes Absolute: 0.9 10*3/uL (ref 0.1–1.0)
Monocytes Relative: 10 %
Neutro Abs: 7 10*3/uL (ref 1.7–7.7)
Neutrophils Relative %: 74 %
Platelets: 252 10*3/uL (ref 150–400)
RBC: 2.67 MIL/uL — ABNORMAL LOW (ref 3.87–5.11)
RDW: 18.2 % — ABNORMAL HIGH (ref 11.5–15.5)
WBC: 9.4 10*3/uL (ref 4.0–10.5)
nRBC: 0.3 % — ABNORMAL HIGH (ref 0.0–0.2)

## 2021-08-10 LAB — BASIC METABOLIC PANEL
Anion gap: 15 (ref 5–15)
BUN: 27 mg/dL — ABNORMAL HIGH (ref 6–20)
CO2: 23 mmol/L (ref 22–32)
Calcium: 7.8 mg/dL — ABNORMAL LOW (ref 8.9–10.3)
Chloride: 97 mmol/L — ABNORMAL LOW (ref 98–111)
Creatinine, Ser: 11.58 mg/dL — ABNORMAL HIGH (ref 0.44–1.00)
GFR, Estimated: 4 mL/min — ABNORMAL LOW (ref >60)
Glucose, Bld: 91 mg/dL (ref 70–99)
Potassium: 4.2 mmol/L (ref 3.5–5.1)
Sodium: 135 mmol/L (ref 135–145)

## 2021-08-10 LAB — PHOSPHORUS: Phosphorus: 5.1 mg/dL — ABNORMAL HIGH (ref 2.5–4.6)

## 2021-08-10 MED ORDER — HEPARIN SODIUM (PORCINE) 1000 UNIT/ML IJ SOLN
INTRAMUSCULAR | Status: AC
  Filled 2021-08-10: qty 4

## 2021-08-10 NOTE — Progress Notes (Signed)
?PROGRESS NOTE ? ? ? ?Courtney Gray  KXF:[id number] DOB: 07/15/1970 DOA: 08/03/2021 ?PCP: Medicine, [facility]  ? ?Brief Narrative:  ?51 y.o. female with medical history significant of end-stage renal disease on hemodialysis, gout, diabetes mellitus type II, hypertension who just discharged from rehab this past Monday and was told that she had run out of her days and that she needed to pay out-of-pocket for any further rehab.  She presented again with worsening right foot pain and there was a concern for ischemic foot.  She was admitted and transferred to [facility]  CT angiogram was inconclusive.  She underwent arteriogram on 08/07/2021.  Podiatry following. ? ?Assessment & Plan: ?  ?Ischemic right foot ?-Evidence of gangrenous changes in some of her toes of the right foot. ?-CT angiogram was inconclusive due to significant calcification. ?-Vascular surgery following: Status post arteriogram on 08/07/2021 which showed severe calcific disease in bilateral lower extremities with no endovascular or open surgical options to improve perfusion.  IV heparin subsequently discontinued by vascular surgery. ?-Podiatry also following.  Recommended to start IV Ancef.  Podiatry has recommended transmet or Chopart level.  She is still thinking about the decision and says that her family will be here in the hospital early next week and until then, she is not going to make any decision. ? ?End-stage renal disease on hemodialysis ?-Nephrology following.  Dialysis as per nephrology schedule ? ?Chronic respiratory failure with hypoxia ?-Chronically on 3 L oxygen via nasal cannula.  Respiratory status currently stable ? ?Diabetes mellitus type 2 with hypoglycemia ?-A1c 5.7. ?-Still has intermittent episodes of hypoglycemia.  Continue CBGs. ?-Glipizide and Lantus were discontinued during last hospitalization because of episodes of hypoglycemia.  Continue SSI. ? ?Essential hypertension ?Blood pressure now better.  On Coreg.   Amlodipine on hold.  We will watch another day. ? ?Anemia of chronic kidney disease ?Macrocytosis ?-Hemoglobin currently stable.  Transfuse if hemoglobin is less than 7 ? ?Hyperlipidemia ?-continue statin ? ?Hypersomnolence ?-Likely due to combination of illness and medications.  Gabapentin dose has been decreased.  Judicious use of narcotics.  Feeling much better and fully awake and alert today. ? ?History of hemorrhoids ?-Stable ? ?Questionable history of gout ?-apparently treated for gout of her right foot recently ? ?Morbid obesity ?-Outpatient follow-up ? ? ?DVT prophylaxis: Subcutaneous heparin  ?code Status: Full ?Family Communication: None at bedside today. ?Disposition Plan: ?Status is: Inpatient ?Remains inpatient appropriate because: Of severity of illness, patient to make decision about surgical options. ? ? ?Consultants: Vascular surgery/podiatry/nephrology ? ?Procedures: Left lower extremity arteriogram on 08/07/2021 ?Antimicrobials: None ? ? ?Subjective: ? ?Seen and examined in the dialysis unit.  She has no complaints.  She is fully alert and oriented and she is in better mood today.  She will not make any decision of surgery until her family comes to the hospital early next week. ? ?Objective: ?Vitals:  ? 08/10/21 0900 08/10/21 0930 08/10/21 1000 08/10/21 1030  ?BP: (!) 165/69 (!) 110/95 (!) 124/93 (!) 148/63  ?Pulse: 77 76 78 74  ?Resp: 20 20 (!) 22 13  ?Temp:      ?TempSrc:      ?SpO2: 100% 100% 100% 100%  ?Weight:      ?Height:      ? ?No intake or output data in the 24 hours ending 08/10/21 1100 ? ?Filed Weights  ? 08/08/21 0830 08/10/21 0322 08/10/21 0703  ?Weight: (!) 161.5 kg (!) 163.5 kg (!) 172 kg  ? ? ?Examination: ? ?General  exam: Appears calm and comfortable, morbidly obese ?Respiratory system: Clear to auscultation. Respiratory effort normal. ?Cardiovascular system: S1 & S2 heard, RRR. No JVD, murmurs, rubs, gallops or clicks. No pedal edema. ?Gastrointestinal system: Abdomen is  nondistended, soft and nontender. No organomegaly or masses felt. Normal bowel sounds heard. ?Central nervous system: Alert and oriented. No focal neurological deficits. ?Extremities: Symmetric 5 x 5 power. ?Skin: Dry gangrene right third and fourth toe. ?Psychiatry: Judgement and insight appear normal. Mood & affect appropriate.  ? ?Data Reviewed: I have personally reviewed following labs and imaging studies ? ?CBC: ?Recent Labs  ?Lab 08/03/21 ?1217 08/04/21 ?8841 08/05/21 ?6606 08/06/21 ?1956 08/07/21 ?3016 08/08/21 ?0109 08/10/21 ?3235  ?WBC 10.0   < > 7.0 8.5 7.4 7.0 9.4  ?NEUTROABS 7.8*  --   --   --   --   --  7.0  ?HGB 7.8*   < > 8.0* 7.8* 8.1* 7.8* 7.6*  ?HCT 28.0*   < > 28.0* 28.7* 28.3* 26.4* 26.1*  ?MCV 102.9*   < > 100.4* 102.1* 100.4* 98.5 97.8  ?PLT 212   < > 221 178 219 211 252  ? < > = values in this interval not displayed.  ? ? ?Basic Metabolic Panel: ?Recent Labs  ?Lab 08/04/21 ?5732 08/05/21 ?2025 08/06/21 ?1707 08/07/21 ?4270 08/08/21 ?6237 08/09/21 ?6283 08/10/21 ?1517  ?NA 137   < > 138 135 134* 135 135  ?K 3.7   < > 3.9 4.1 4.4 4.2 4.2  ?CL 98   < > 99 97* 97* 99 97*  ?CO2 24   < > 27 24 22 22 23   ?GLUCOSE 108*   < > 72 64* 82 96 91  ?BUN 57*   < > 19 24* 32* 21* 27*  ?CREATININE 13.97*   < > 8.49* 10.11* 11.66* 9.25* 11.58*  ?CALCIUM 8.0*   < > 8.1* 7.8* 7.7* 7.7* 7.8*  ?PHOS 6.5*  --   --   --  5.2*  --  5.1*  ? < > = values in this interval not displayed.  ? ? ?GFR: ?Estimated Creatinine Clearance: 9.3 mL/min (A) (by C-G formula based on SCr of 11.58 mg/dL (H)). ?Liver Function Tests: ?Recent Labs  ?Lab 08/03/21 ?1217 08/08/21 ?0620  ?AST 17  --   ?ALT 15  --   ?ALKPHOS 61  --   ?BILITOT 0.7  --   ?PROT 7.2  --   ?ALBUMIN 3.1* 2.9*  ? ? ?No results for input(s): LIPASE, AMYLASE in the last 168 hours. ?No results for input(s): AMMONIA in the last 168 hours. ?Coagulation Profile: ?No results for input(s): INR, PROTIME in the last 168 hours. ?Cardiac Enzymes: ?Recent Labs  ?Lab 08/03/21 ?1217   ?CKTOTAL 131  ? ? ?BNP (last 3 results) ?No results for input(s): PROBNP in the last 8760 hours. ?HbA1C: ?No results for input(s): HGBA1C in the last 72 hours. ?CBG: ?Recent Labs  ?Lab 08/09/21 ?6160 08/09/21 ?1142 08/09/21 ?1642 08/09/21 ?2108 08/10/21 ?7371  ?GLUCAP 103* 92 108* 115* 85  ? ? ?Lipid Profile: ?Recent Labs  ?  08/08/21 ?0620  ?CHOL 72  ?HDL 26*  ?[place] 23  ?TRIG 114  ?CHOLHDL 2.8  ? ? ?Thyroid Function Tests: ?No results for input(s): TSH, T4TOTAL, FREET4, T3FREE, THYROIDAB in the last 72 hours. ?Anemia Panel: ?No results for input(s): VITAMINB12, FOLATE, FERRITIN, TIBC, IRON, RETICCTPCT in the last 72 hours. ?Sepsis Labs: ?No results for input(s): PROCALCITON, LATICACIDVEN in the last 168 hours. ? ?No results found  for this or any previous visit (from the past 240 hour(s)).  ? ? ? ? ? ?Radiology Studies: ?No results found. ? ? ? ? ? ?Scheduled Meds: ? aspirin EC  81 mg Oral Q breakfast  ? atorvastatin  10 mg Oral QHS  ? calcitRIOL  1 mcg Oral Q T,Th,Sa-HD  ? calcium carbonate  1,500 mg Oral TID with meals  ? calcium carbonate  1.5 tablet Oral With snacks  ? carvedilol  6.25 mg Oral BID WC  ? cinacalcet  30 mg Oral Q T,Th,Sa-HD  ? darbepoetin (ARANESP) injection - DIALYSIS  200 mcg Intravenous Q Sat-HD  ? docusate sodium  100 mg Oral BID  ? gabapentin  100 mg Oral BID  ? heparin injection (subcutaneous)  5,000 Units Subcutaneous Q8H  ? insulin aspart  0-6 Units Subcutaneous TID WC  ? lidocaine  1 patch Transdermal Q24H  ? mupirocin ointment  1 application. Topical BID  ? neomycin-bacitracin-polymyxin   Topical BID  ? nystatin   Topical TID  ? pantoprazole  40 mg Oral Daily  ? polyethylene glycol  17 g Oral Daily  ? sodium chloride flush  10-40 mL Intracatheter Q12H  ? ?Continuous Infusions: ? sodium chloride    ? sodium chloride    ?  ceFAZolin (ANCEF) IV    ? ? ?Darliss Cheney, MD ?[facility] ?08/10/2021, 11:00 AM  ? ?

## 2021-08-10 NOTE — Social Work (Signed)
Pt has no SNF bed offers. ? ?Emeterio Reeve, LCSW ?Clinical Social Worker ? ?

## 2021-08-10 NOTE — Progress Notes (Signed)
?[facility] ?Progress Note  ? ?Subjective:    ?Seen and examined patient on HD. C/o R foot pain. PRN dilaudid already given. Denies SOB, CP, and N/V. She reports low appetite over the past few weeks and is working on increasing her dietary intake. ? ?Objective ?Vitals:  ? 08/10/21 1275 08/10/21 0741 08/10/21 1700 08/10/21 0841  ?BP: (!) 174/74 (!) 181/85 (!) 183/70 (!) 171/82  ?Pulse: 71 73 76 76  ?Resp: 15 15 15 16   ?Temp:      ?TempSrc:      ?SpO2: 100% 100% 98% 100%  ?Weight:      ?Height:      ? ?Physical Exam ?General: Obese woman, on nasal O2, nad  ?Heart: Distant No m,r,g  ?Lungs: Clear bilaterally  ?Abdomen: soft, non -tender ?Extremities: unable to palpate d/t R foot pain, dusky toes R foot  ?Dialysis Access: R IJ TDC  ? ?Filed Weights  ? 08/08/21 0830 08/10/21 0322 08/10/21 0703  ?Weight: (!) 161.5 kg (!) 163.5 kg (!) 172 kg  ? ?No intake or output data in the 24 hours ending 08/10/21 0906 ? ?Additional Objective ?Labs: ?Basic Metabolic Panel: ?Recent Labs  ?Lab 08/04/21 ?1749 08/05/21 ?4496 08/08/21 ?7591 08/09/21 ?6384 08/10/21 ?0258  ?NA 137   < > 134* 135 135  ?K 3.7   < > 4.4 4.2 4.2  ?CL 98   < > 97* 99 97*  ?CO2 24   < > 22 22 23   ?GLUCOSE 108*   < > 82 96 91  ?BUN 57*   < > 32* 21* 27*  ?CREATININE 13.97*   < > 11.66* 9.25* 11.58*  ?CALCIUM 8.0*   < > 7.7* 7.7* 7.8*  ?PHOS 6.5*  --  5.2*  --  5.1*  ? < > = values in this interval not displayed.  ? ?Liver Function Tests: ?Recent Labs  ?Lab 08/03/21 ?1217 08/08/21 ?0620  ?AST 17  --   ?ALT 15  --   ?ALKPHOS 61  --   ?BILITOT 0.7  --   ?PROT 7.2  --   ?ALBUMIN 3.1* 2.9*  ? ?No results for input(s): LIPASE, AMYLASE in the last 168 hours. ?CBC: ?Recent Labs  ?Lab 08/03/21 ?1217 08/04/21 ?6659 08/05/21 ?9357 08/06/21 ?1956 08/07/21 ?0177 08/08/21 ?9390 08/10/21 ?3009  ?WBC 10.0   < > 7.0 8.5 7.4 7.0 9.4  ?NEUTROABS 7.8*  --   --   --   --   --  7.0  ?HGB 7.8*   < > 8.0* 7.8* 8.1* 7.8* 7.6*  ?HCT 28.0*   < > 28.0* 28.7* 28.3* 26.4* 26.1*   ?MCV 102.9*   < > 100.4* 102.1* 100.4* 98.5 97.8  ?PLT 212   < > 221 178 219 211 252  ? < > = values in this interval not displayed.  ? ?Blood Culture ?   ?Component Value Date/Time  ? SDES BLOOD LEFT HAND 09/03/2020 2359  ? SPECREQUEST  09/03/2020 2359  ?  BOTTLES DRAWN AEROBIC AND ANAEROBIC Blood Culture adequate volume  ? CULT  09/03/2020 2359  ?  NO GROWTH 5 DAYS ?Performed at [facility], [address], [place], [place] [address] ?  ? REPTSTATUS 09/09/2020 FINAL 09/03/2020 2359  ? ? ?Cardiac Enzymes: ?Recent Labs  ?Lab 08/03/21 ?1217  ?CKTOTAL 131  ? ?CBG: ?Recent Labs  ?Lab 08/09/21 ?7622 08/09/21 ?1142 08/09/21 ?1642 08/09/21 ?2108 08/10/21 ?6333  ?GLUCAP 103* 92 108* 115* 85  ? ?Iron Studies: No results for input(s): IRON,  TIBC, TRANSFERRIN, FERRITIN in the last 72 hours. ?Lab Results  ?Component Value Date  ? INR 1.3 (H) 07/10/2021  ? ?Studies/Results: ?No results found. ? ?Medications: ? sodium chloride    ? sodium chloride    ?  ceFAZolin (ANCEF) IV    ? ? aspirin EC  81 mg Oral Q breakfast  ? atorvastatin  10 mg Oral QHS  ? calcitRIOL  1 mcg Oral Q T,Th,Sa-HD  ? calcium carbonate  1,500 mg Oral TID with meals  ? calcium carbonate  1.5 tablet Oral With snacks  ? carvedilol  6.25 mg Oral BID WC  ? cinacalcet  30 mg Oral Q T,Th,Sa-HD  ? darbepoetin (ARANESP) injection - DIALYSIS  200 mcg Intravenous Q Sat-HD  ? docusate sodium  100 mg Oral BID  ? gabapentin  100 mg Oral BID  ? heparin injection (subcutaneous)  5,000 Units Subcutaneous Q8H  ? insulin aspart  0-6 Units Subcutaneous TID WC  ? lidocaine  1 patch Transdermal Q24H  ? mupirocin ointment  1 application. Topical BID  ? neomycin-bacitracin-polymyxin   Topical BID  ? nystatin   Topical TID  ? pantoprazole  40 mg Oral Daily  ? polyethylene glycol  17 g Oral Daily  ? sodium chloride flush  10-40 mL Intracatheter Q12H  ? ? ?Dialysis Orders: ?[name] ? 4h 18min  165kg  1K/2.5Ca bath  TDC  400 BFR ? - 4/29 > HBsAg neg w/ Abs < 10 ? - mircera  200ug q2 ? - calcitriol 1 ug tiw ? - sensipar 30 mg tiw ?  ?Assessment/Plan: ?Bilateral foot pain - w/ ischemic changes R foot+ gangrenous toes x VVS consulted. S/p Bilat LE angiogram 5/3 per Dr. Virl Cagey note - severe calcific disease bilaterally. Podiatry also following-now on IV Ancef per their recommendations. No further vascular options, at risk of limb loss, patient very emotional about this.Marland Kitchen ?ESRD - on HD TTS. On HD.  ?Volume - BP high this AM, lower BP with HD.  Not to EDW yet. Continue UF as able for volume. Tolerating UFG 3.5L. ?HTN - home meds per pmd as needed.  ?Anemia ckd - Hgb 7.6. Redose ESA with HD Sat.  ?MBD ckd - CCa in range and phos up a bit. Cont sensipar and vdra w/ HD tiw. Tums as binder. ?Chronic hypoxic respiratory failure - on home oxygen  ?Depressed mood -At risk of limb loss, chaplain to see for support.  ?  ?Tobie Poet, NP ?[facility] ?08/10/2021,9:06 AM ? LOS: 6 days  ?  ?

## 2021-08-10 NOTE — Progress Notes (Signed)
Pt IV working well and does not need an additional IV at this time per primary RN. IV Team will remain available as needed. ?

## 2021-08-11 ENCOUNTER — Inpatient Hospital Stay (HOSPITAL_COMMUNITY): Payer: 59

## 2021-08-11 DIAGNOSIS — I998 Other disorder of circulatory system: Secondary | ICD-10-CM | POA: Diagnosis not present

## 2021-08-11 DIAGNOSIS — L0390 Cellulitis, unspecified: Secondary | ICD-10-CM | POA: Diagnosis not present

## 2021-08-11 DIAGNOSIS — I96 Gangrene, not elsewhere classified: Secondary | ICD-10-CM | POA: Diagnosis not present

## 2021-08-11 DIAGNOSIS — M79673 Pain in unspecified foot: Secondary | ICD-10-CM | POA: Diagnosis not present

## 2021-08-11 LAB — GLUCOSE, CAPILLARY
Glucose-Capillary: 107 mg/dL — ABNORMAL HIGH (ref 70–99)
Glucose-Capillary: 103 mg/dL — ABNORMAL HIGH (ref 70–99)
Glucose-Capillary: 89 mg/dL (ref 70–99)
Glucose-Capillary: 176 mg/dL — ABNORMAL HIGH (ref 70–99)

## 2021-08-11 MED ORDER — LISINOPRIL 20 MG PO TABS
20.0000 mg | ORAL_TABLET | Freq: Every day | ORAL | Status: DC
  Administered 2021-08-13 – 2021-08-17 (×2): 20 mg via ORAL
  Filled 2021-08-11 (×13): qty 1

## 2021-08-11 MED ORDER — NEPRO/CARBSTEADY PO LIQD
237.0000 mL | Freq: Three times a day (TID) | ORAL | Status: DC
  Administered 2021-08-11 – 2021-08-28 (×7): 237 mL via ORAL

## 2021-08-11 NOTE — Progress Notes (Signed)
Pharmacy Antibiotic Note ? ?Courtney Gray is a 51 y.o. female  with ischemic R foot s/p angiogram with no options for intervention. Pharmacy has been consulted for ancef dosing for R foot gangrene. She is noted with ESRD on HD TTS.  Antibiotics to be continued while patient is making decision on amputation. WBC WNL and afebrile. ? ?Plan: ?Continue Ancef 2gm IV TTS with HD ?Will follow plans for length of therapy ? ? ?Height: 5\' 5"  (165.1 cm) ?Weight: (!) 165.7 kg (365 lb 4.8 oz) ?IBW/kg (Calculated) : 57 ? ?Temp (24hrs), Avg:98.3 ?F (36.8 ?C), Min:98 ?F (36.7 ?C), Max:98.7 ?F (37.1 ?C) ? ?Recent Labs  ?Lab 08/05/21 ?0017 08/06/21 ?1707 08/06/21 ?1956 08/07/21 ?4944 08/08/21 ?9675 08/09/21 ?9163 08/10/21 ?8466  ?WBC 7.0  --  8.5 7.4 7.0  --  9.4  ?CREATININE 10.76* 8.49*  --  10.11* 11.66* 9.25* 11.58*  ? ?  ?Estimated Creatinine Clearance: 9.1 mL/min (A) (by C-G formula based on SCr of 11.58 mg/dL (H)).   ? ?Allergies  ?Allergen Reactions  ? Benzonatate Other (See Comments)  ?  Hallucinations  ? Hydralazine Other (See Comments)  ?  Hallucinations  ? Amoxicillin Nausea And Vomiting  ? Clonidine Other (See Comments)  ?  Altered mental state  ? Chlorhexidine Itching  ? Morphine Itching  ? Oxycodone Itching  ?  Pt tolerated hospital admission 07/2021  ? ? ?Antimicrobials this admission: ?5/4 ancef>> ? ?Thank you for allowing pharmacy to participate in this patient's care. ? ?Reatha Harps, PharmD ?PGY1 Pharmacy Resident ?08/11/2021 9:14 AM ?Check [email or web address] for unit specific pharmacy number ? ? ? ?

## 2021-08-11 NOTE — Progress Notes (Signed)
VASCULAR LAB ? ? ? ?Left upper extremity arterial duplex has been performed. ? ?See CV proc for preliminary results. ? ? ?Benedicto Capozzi, RVT ?08/11/2021, 4:52 PM ? ?

## 2021-08-11 NOTE — Progress Notes (Addendum)
?  Progress Note ? ? ? ?08/11/2021 ?9:27 AM ?4 Days Post-Op ? ?Subjective:  Patient has decided to proceed with R BKA.  Also complaining of L 3rd finger pain ? ? ?Vitals:  ? 08/11/21 0346 08/11/21 0800  ?BP: (!) 150/56 (!) 142/75  ?Pulse:  72  ?Resp:  17  ?Temp: 98.7 ?F (37.1 ?C) 98 ?F (36.7 ?C)  ?SpO2:  96%  ? ?Physical Exam: ?Lungs:  non labored ?Extremities:  R foot toe and distal foot gangrene; unable to feel radial pulses; tissue changes L 3rd finger ?Neurologic: a&O ? ?CBC ?   ?Component Value Date/Time  ? WBC 9.4 08/10/2021 0258  ? RBC 2.67 (L) 08/10/2021 0258  ? HGB 7.6 (L) 08/10/2021 0258  ? HCT 26.1 (L) 08/10/2021 0258  ? PLT 252 08/10/2021 0258  ? MCV 97.8 08/10/2021 0258  ? MCH 28.5 08/10/2021 0258  ? MCHC 29.1 (L) 08/10/2021 0258  ? RDW 18.2 (H) 08/10/2021 0258  ? LYMPHSABS 1.0 08/10/2021 0258  ? MONOABS 0.9 08/10/2021 0258  ? EOSABS 0.3 08/10/2021 0258  ? BASOSABS 0.1 08/10/2021 0258  ? ? ?BMET ?   ?Component Value Date/Time  ? NA 135 08/10/2021 0258  ? K 4.2 08/10/2021 0258  ? CL 97 (L) 08/10/2021 0258  ? CO2 23 08/10/2021 0258  ? GLUCOSE 91 08/10/2021 0258  ? BUN 27 (H) 08/10/2021 0258  ? CREATININE 11.58 (H) 08/10/2021 0258  ? CALCIUM 7.8 (L) 08/10/2021 0258  ? GFRNONAA 4 (L) 08/10/2021 0258  ? GFRAA 6 (L) 11/29/2019 0120  ? ? ?INR ?   ?Component Value Date/Time  ? INR 1.3 (H) 07/10/2021 1962  ? ? ? ?Intake/Output Summary (Last 24 hours) at 08/11/2021 0927 ?Last data filed at 08/10/2021 1800 ?Gross per 24 hour  ?Intake 340 ml  ?Output 3000 ml  ?Net -2660 ml  ? ? ? ?Assessment/Plan:  51 y.o. female with R foot gangrene 4 Days Post-Op  ? ?Patient and family have decided to proceed with R BKA; this will likely be scheduled early to mid week ?Check L arm arterial duplex due to worsening tissue changes to L 3rd finger based on image from 4/29 ?Continue ancef ? ? ?Dagoberto Ligas, PA-C ?[facility] ?[phone] ?08/11/2021 ?9:27 AM ? ?I agree with the above assessment and plan as documented by  the PA.  The patient is now amenable to a right below-knee amputation.  This will be scheduled later in the week ? ?Courtney Gray ? ?

## 2021-08-11 NOTE — Progress Notes (Signed)
?PROGRESS NOTE ? ? ? ?Keajah Killough  SWF:[id number] DOB: [birth year] DOA: 08/03/2021 ?PCP: [facility], [facility]  ? ?Brief Narrative:  ?51 y.o. female with medical history significant of end-stage renal disease on hemodialysis, gout, diabetes mellitus type II, hypertension who just discharged from rehab this past Monday and was told that she had run out of her days and that she needed to pay out-of-pocket for any further rehab.  She presented again with worsening right foot pain and there was a concern for ischemic foot.  She was admitted and transferred to [facility].  CT angiogram was inconclusive.  She underwent arteriogram on 08/07/2021.  Podiatry following. ? ?Assessment & Plan: ?  ?Ischemic right foot ?-Evidence of gangrenous changes in some of her toes of the right foot. ?-CT angiogram was inconclusive due to significant calcification. ?-Vascular surgery following: Status post arteriogram on 08/07/2021 which showed severe calcific disease in bilateral lower extremities with no endovascular or open surgical options to improve perfusion.  IV heparin subsequently discontinued by vascular surgery. ?-Podiatry also following.  Recommended to start IV Ancef.  Podiatry has recommended transmet or [place] level.  Vascular is a following.  Patient has made a decision to proceed with right BKA but sometime in the middle of the week.  She has already informed her decision to the vascular surgery. ? ?End-stage renal disease on hemodialysis ?-Nephrology following.  Dialysis as per nephrology schedule ? ?Chronic respiratory failure with hypoxia ?-Chronically on 3 L oxygen via nasal cannula.  Respiratory status currently stable ? ?Diabetes mellitus type 2 with hypoglycemia ?-A1c 5.7. ?-Still has intermittent episodes of hypoglycemia.  Continue CBGs. ?-Glipizide and Lantus were discontinued during last hospitalization because of episodes of hypoglycemia.  Continue SSI. ? ?Essential hypertension ?Blood pressure now  better.  On Coreg.  Amlodipine on hold.  We will watch another day. ? ?Anemia of chronic kidney disease ?Macrocytosis ?-Hemoglobin currently stable.  Transfuse if hemoglobin is less than 7 ? ?Hyperlipidemia ?-continue statin ? ?Hypersomnolence ?-Likely due to combination of illness and medications.  Gabapentin dose has been decreased.  Judicious use of narcotics.  Feeling much better and fully awake and alert Since 08/10/2021. ? ?History of hemorrhoids ?-Stable ? ?Questionable history of gout ?-apparently treated for gout of her right foot recently ? ?Morbid obesity ?-Outpatient follow-up ? ? ?DVT prophylaxis: Subcutaneous heparin  ?code Status: Full ?Family Communication: Daughter at the bedside. ?Disposition Plan: ?Status is: Inpatient ?Remains inpatient appropriate because: Of severity of illness, patient to make decision about surgical options. ? ? ?Consultants: Vascular surgery/podiatry/nephrology ? ?Procedures: Left lower extremity arteriogram on 08/07/2021 ?Antimicrobials: None ? ? ?Subjective: ? ?Seen and examined.  She is fully awake and alert.  Daughter at the bedside.  She tells me that she has made her decision to proceed with right BKA and she is already informed vascular surgery. ? ?Objective: ?Vitals:  ? 08/10/21 2200 08/11/21 0300 08/11/21 0346 08/11/21 0800  ?BP: (!) 148/66  (!) 150/56 (!) 142/75  ?Pulse: 85   72  ?Resp: 18 (!) 22  17  ?Temp: 98 ?F (36.7 ?C)  98.7 ?F (37.1 ?C) 98 ?F (36.7 ?C)  ?TempSrc: Oral  Oral Oral  ?SpO2: 98%   96%  ?Weight:  (!) 165.7 kg    ?Height:      ? ? ?Intake/Output Summary (Last 24 hours) at 08/11/2021 1029 ?Last data filed at 08/10/2021 1800 ?Gross per 24 hour  ?Intake 340 ml  ?Output 3000 ml  ?Net -2660 ml  ? ? ?Filed  Weights  ? 08/10/21 0703 08/10/21 1125 08/11/21 0300  ?Weight: (!) 172 kg (!) 169.1 kg (!) 165.7 kg  ? ? ?Examination: ? ?General exam: Appears calm and comfortable, morbidly obese ?Respiratory system: Clear to auscultation. Respiratory effort  normal. ?Cardiovascular system: S1 & S2 heard, RRR. No JVD, murmurs, rubs, gallops or clicks. No pedal edema. ?Gastrointestinal system: Abdomen is nondistended, soft and nontender. No organomegaly or masses felt. Normal bowel sounds heard. ?Central nervous system: Alert and oriented. No focal neurological deficits. ?Extremities: Symmetric 5 x 5 power. ?Skin: Dry gangrene right third and fourth toe ?Psychiatry: Judgement and insight appear normal. Mood & affect appropriate.  ? ? ?Data Reviewed: I have personally reviewed following labs and imaging studies ? ?CBC: ?Recent Labs  ?Lab 08/05/21 ?0960 08/06/21 ?1956 08/07/21 ?4540 08/08/21 ?9811 08/10/21 ?0258  ?WBC 7.0 8.5 7.4 7.0 9.4  ?NEUTROABS  --   --   --   --  7.0  ?HGB 8.0* 7.8* 8.1* 7.8* 7.6*  ?HCT 28.0* 28.7* 28.3* 26.4* 26.1*  ?MCV 100.4* 102.1* 100.4* 98.5 97.8  ?PLT 221 178 219 211 252  ? ? ?Basic Metabolic Panel: ?Recent Labs  ?Lab 08/06/21 ?1707 08/07/21 ?9147 08/08/21 ?8295 08/09/21 ?6213 08/10/21 ?0258  ?NA 138 135 134* 135 135  ?K 3.9 4.1 4.4 4.2 4.2  ?CL 99 97* 97* 99 97*  ?CO2 27 24 22 22 23   ?GLUCOSE 72 64* 82 96 91  ?BUN 19 24* 32* 21* 27*  ?CREATININE 8.49* 10.11* 11.66* 9.25* 11.58*  ?CALCIUM 8.1* 7.8* 7.7* 7.7* 7.8*  ?PHOS  --   --  5.2*  --  5.1*  ? ? ?GFR: ?Estimated Creatinine Clearance: 9.1 mL/min (A) (by C-G formula based on SCr of 11.58 mg/dL (H)). ?Liver Function Tests: ?Recent Labs  ?Lab 08/08/21 ?0620  ?ALBUMIN 2.9*  ? ? ?No results for input(s): LIPASE, AMYLASE in the last 168 hours. ?No results for input(s): AMMONIA in the last 168 hours. ?Coagulation Profile: ?No results for input(s): INR, PROTIME in the last 168 hours. ?Cardiac Enzymes: ?No results for input(s): CKTOTAL, CKMB, CKMBINDEX, TROPONINI in the last 168 hours. ? ?BNP (last 3 results) ?No results for input(s): PROBNP in the last 8760 hours. ?HbA1C: ?No results for input(s): HGBA1C in the last 72 hours. ?CBG: ?Recent Labs  ?Lab 08/09/21 ?2108 08/10/21 ?0612 08/10/21 ?1631  08/10/21 ?2047 08/11/21 ?0865  ?GLUCAP 115* 85 92 101* 107*  ? ? ?Lipid Profile: ?No results for input(s): CHOL, HDL, LDLCALC, TRIG, CHOLHDL, LDLDIRECT in the last 72 hours. ? ?Thyroid Function Tests: ?No results for input(s): TSH, T4TOTAL, FREET4, T3FREE, THYROIDAB in the last 72 hours. ?Anemia Panel: ?No results for input(s): VITAMINB12, FOLATE, FERRITIN, TIBC, IRON, RETICCTPCT in the last 72 hours. ?Sepsis Labs: ?No results for input(s): PROCALCITON, LATICACIDVEN in the last 168 hours. ? ?No results found for this or any previous visit (from the past 240 hour(s)).  ? ? ? ? ? ?Radiology Studies: ?No results found. ? ? ? ? ? ?Scheduled Meds: ? aspirin EC  81 mg Oral Q breakfast  ? atorvastatin  10 mg Oral QHS  ? calcitRIOL  1 mcg Oral Q T,Th,Sa-HD  ? calcium carbonate  1,500 mg Oral TID with meals  ? calcium carbonate  1.5 tablet Oral With snacks  ? carvedilol  6.25 mg Oral BID WC  ? cinacalcet  30 mg Oral Q T,Th,Sa-HD  ? darbepoetin (ARANESP) injection - DIALYSIS  200 mcg Intravenous Q Sat-HD  ? docusate sodium  100 mg Oral BID  ?  gabapentin  100 mg Oral BID  ? heparin injection (subcutaneous)  5,000 Units Subcutaneous Q8H  ? insulin aspart  0-6 Units Subcutaneous TID WC  ? lidocaine  1 patch Transdermal Q24H  ? lisinopril  20 mg Oral Daily  ? mupirocin ointment  1 application. Topical BID  ? neomycin-bacitracin-polymyxin   Topical BID  ? nystatin   Topical TID  ? pantoprazole  40 mg Oral Daily  ? polyethylene glycol  17 g Oral Daily  ? sodium chloride flush  10-40 mL Intracatheter Q12H  ? ?Continuous Infusions: ? sodium chloride    ? sodium chloride    ?  ceFAZolin (ANCEF) IV Stopped (08/10/21 1505)  ? ? ?Darliss Cheney, MD ?[facility] ?08/11/2021, 10:29 AM  ? ?

## 2021-08-11 NOTE — Progress Notes (Signed)
?[facility] ?Progress Note  ? ?Subjective:    ?Seen and examined patient at bedside. Patient's daughter also at bedside. Tolerated yesterday's HD with net UF 3L. She has been reporting low appetite for several weeks. Also c/o nausea and constipation. Miralax ordered. Reviewed Vascular's recent note. Patient now agreeable to proceed with R BKA-likely procedure to be schedule early-mid this week ? ?Objective ?Vitals:  ? 08/10/21 2200 08/11/21 0300 08/11/21 0346 08/11/21 0800  ?BP: (!) 148/66  (!) 150/56 (!) 142/75  ?Pulse: 85   72  ?Resp: 18 (!) 22  17  ?Temp: 98 ?F (36.7 ?C)  98.7 ?F (37.1 ?C) 98 ?F (36.7 ?C)  ?TempSrc: Oral  Oral Oral  ?SpO2: 98%   96%  ?Weight:  (!) 165.7 kg    ?Height:      ? ?Physical Exam ?General: Obese woman, on nasal O2, nad  ?Heart: Distant No m,r,g  ?Lungs: Clear bilaterally  ?Abdomen: soft, non -tender ?Extremities: unable to palpate d/t R foot pain, dusky toes R foot  ?Dialysis Access: R IJ TDC  ?  ?Filed Weights  ? 08/10/21 0703 08/10/21 1125 08/11/21 0300  ?Weight: (!) 172 kg (!) 169.1 kg (!) 165.7 kg  ? ? ?Intake/Output Summary (Last 24 hours) at 08/11/2021 1214 ?Last data filed at 08/10/2021 1800 ?Gross per 24 hour  ?Intake 340 ml  ?Output --  ?Net 340 ml  ? ? ?Additional Objective ?Labs: ?Basic Metabolic Panel: ?Recent Labs  ?Lab 08/08/21 ?0620 08/09/21 ?0242 08/10/21 ?0258  ?NA 134* 135 135  ?K 4.4 4.2 4.2  ?CL 97* 99 97*  ?CO2 22 22 23   ?GLUCOSE 82 96 91  ?BUN 32* 21* 27*  ?CREATININE 11.66* 9.25* 11.58*  ?CALCIUM 7.7* 7.7* 7.8*  ?PHOS 5.2*  --  5.1*  ? ?Liver Function Tests: ?Recent Labs  ?Lab 08/08/21 ?0620  ?ALBUMIN 2.9*  ? ?No results for input(s): LIPASE, AMYLASE in the last 168 hours. ?CBC: ?Recent Labs  ?Lab 08/05/21 ?4268 08/06/21 ?1956 08/07/21 ?3419 08/08/21 ?6222 08/10/21 ?0258  ?WBC 7.0 8.5 7.4 7.0 9.4  ?NEUTROABS  --   --   --   --  7.0  ?HGB 8.0* 7.8* 8.1* 7.8* 7.6*  ?HCT 28.0* 28.7* 28.3* 26.4* 26.1*  ?MCV 100.4* 102.1* 100.4* 98.5 97.8  ?PLT 221 178 219  211 252  ? ?Blood Culture ?   ?Component Value Date/Time  ? SDES BLOOD LEFT HAND 09/03/2020 2359  ? SPECREQUEST  09/03/2020 2359  ?  BOTTLES DRAWN AEROBIC AND ANAEROBIC Blood Culture adequate volume  ? CULT  09/03/2020 2359  ?  NO GROWTH 5 DAYS ?Performed at [facility], [address] [place], [place] [address] ?  ? REPTSTATUS 09/09/2020 FINAL 09/03/2020 2359  ? ? ?Cardiac Enzymes: ?No results for input(s): CKTOTAL, CKMB, CKMBINDEX, TROPONINI in the last 168 hours. ?CBG: ?Recent Labs  ?Lab 08/09/21 ?2108 08/10/21 ?0612 08/10/21 ?1631 08/10/21 ?2047 08/11/21 ?2119  ?GLUCAP 115* 85 92 101* 107*  ? ?Iron Studies: No results for input(s): IRON, TIBC, TRANSFERRIN, FERRITIN in the last 72 hours. ?Lab Results  ?Component Value Date  ? INR 1.3 (H) 07/10/2021  ? ?Studies/Results: ?No results found. ? ?Medications: ? sodium chloride    ? sodium chloride    ?  ceFAZolin (ANCEF) IV Stopped (08/10/21 1505)  ? ? aspirin EC  81 mg Oral Q breakfast  ? atorvastatin  10 mg Oral QHS  ? calcitRIOL  1 mcg Oral Q T,Th,Sa-HD  ? calcium carbonate  1,500 mg Oral TID  with meals  ? calcium carbonate  1.5 tablet Oral With snacks  ? carvedilol  6.25 mg Oral BID WC  ? cinacalcet  30 mg Oral Q T,Th,Sa-HD  ? darbepoetin (ARANESP) injection - DIALYSIS  200 mcg Intravenous Q Sat-HD  ? docusate sodium  100 mg Oral BID  ? feeding supplement (NEPRO CARB STEADY)  237 mL Oral TID WC  ? gabapentin  100 mg Oral BID  ? heparin injection (subcutaneous)  5,000 Units Subcutaneous Q8H  ? insulin aspart  0-6 Units Subcutaneous TID WC  ? lidocaine  1 patch Transdermal Q24H  ? lisinopril  20 mg Oral Daily  ? mupirocin ointment  1 application. Topical BID  ? neomycin-bacitracin-polymyxin   Topical BID  ? nystatin   Topical TID  ? pantoprazole  40 mg Oral Daily  ? polyethylene glycol  17 g Oral Daily  ? sodium chloride flush  10-40 mL Intracatheter Q12H  ? ? ?Dialysis Orders: ?[name] ? 4h 79min  165kg  1K/2.5Ca bath  TDC  400 BFR ? - 4/29 > HBsAg neg w/  Abs < 10 ? - mircera 200ug q2 ? - calcitriol 1 ug tiw ? - sensipar 30 mg tiw ? ?Assessment/Plan: ?Bilateral foot pain - w/ ischemic changes R foot+ gangrenous toes x VVS consulted. S/p Bilat LE angiogram 5/3 per Dr. Virl Cagey note - severe calcific disease bilaterally. Podiatry also following-now on IV Ancef per their recommendations. No further vascular options. Patient now agreeable to proceed with R BKA-likely procedure to be scheduled early-mid this upcoming week. ?ESRD - on HD TTS. Plan for HD 08/13/21.  ?Volume - BP improving, lower BP with HD.  Not to EDW yet. Continue UF as able for volume. Tolerating UFG 3.5L. ?HTN - home meds per pmd as needed.  ?Anemia ckd - Hgb 7.6. Checking labs in AM. Redose ESA with HD Sat.  ?MBD ckd - CCa in range and phos up a bit. Cont sensipar and vdra w/ HD tiw. Tums as binder. ?Chronic hypoxic respiratory failure - on home oxygen  ?Depressed mood -At risk of limb loss, chaplain to see for support.  ?Nutrition-reporting low appetite over past few weeks. Will check Albumin. Ordered Nepro supplement. ?  ?Tobie Poet, NP ?[facility] ?08/11/2021,12:14 PM ? LOS: 7 days  ?  ?

## 2021-08-12 DIAGNOSIS — I96 Gangrene, not elsewhere classified: Secondary | ICD-10-CM | POA: Diagnosis not present

## 2021-08-12 DIAGNOSIS — I998 Other disorder of circulatory system: Secondary | ICD-10-CM | POA: Diagnosis not present

## 2021-08-12 DIAGNOSIS — M79673 Pain in unspecified foot: Secondary | ICD-10-CM | POA: Diagnosis not present

## 2021-08-12 LAB — CBC WITH DIFFERENTIAL/PLATELET
Abs Immature Granulocytes: 0.2 10*3/uL — ABNORMAL HIGH (ref 0.00–0.07)
Basophils Absolute: 0.1 10*3/uL (ref 0.0–0.1)
Basophils Relative: 1 %
Eosinophils Absolute: 0.4 10*3/uL (ref 0.0–0.5)
Eosinophils Relative: 3 %
HCT: 28.2 % — ABNORMAL LOW (ref 36.0–46.0)
Hemoglobin: 8.5 g/dL — ABNORMAL LOW (ref 12.0–15.0)
Immature Granulocytes: 2 %
Lymphocytes Relative: 10 %
Lymphs Abs: 1.3 10*3/uL (ref 0.7–4.0)
MCH: 28.7 pg (ref 26.0–34.0)
MCHC: 30.1 g/dL (ref 30.0–36.0)
MCV: 95.3 fL (ref 80.0–100.0)
Monocytes Absolute: 2.1 10*3/uL — ABNORMAL HIGH (ref 0.1–1.0)
Monocytes Relative: 16 %
Neutro Abs: 9.1 10*3/uL — ABNORMAL HIGH (ref 1.7–7.7)
Neutrophils Relative %: 68 %
Platelets: 299 10*3/uL (ref 150–400)
RBC: 2.96 MIL/uL — ABNORMAL LOW (ref 3.87–5.11)
RDW: 18 % — ABNORMAL HIGH (ref 11.5–15.5)
WBC: 13.2 10*3/uL — ABNORMAL HIGH (ref 4.0–10.5)
nRBC: 0.4 % — ABNORMAL HIGH (ref 0.0–0.2)

## 2021-08-12 LAB — GLUCOSE, CAPILLARY
Glucose-Capillary: 95 mg/dL (ref 70–99)
Glucose-Capillary: 103 mg/dL — ABNORMAL HIGH (ref 70–99)
Glucose-Capillary: 116 mg/dL — ABNORMAL HIGH (ref 70–99)
Glucose-Capillary: 158 mg/dL — ABNORMAL HIGH (ref 70–99)
Glucose-Capillary: 115 mg/dL — ABNORMAL HIGH (ref 70–99)

## 2021-08-12 LAB — RENAL FUNCTION PANEL
Albumin: 2.8 g/dL — ABNORMAL LOW (ref 3.5–5.0)
Anion gap: 15 (ref 5–15)
BUN: 26 mg/dL — ABNORMAL HIGH (ref 6–20)
CO2: 22 mmol/L (ref 22–32)
Calcium: 7.5 mg/dL — ABNORMAL LOW (ref 8.9–10.3)
Chloride: 94 mmol/L — ABNORMAL LOW (ref 98–111)
Creatinine, Ser: 9.77 mg/dL — ABNORMAL HIGH (ref 0.44–1.00)
GFR, Estimated: 4 mL/min — ABNORMAL LOW (ref >60)
Glucose, Bld: 119 mg/dL — ABNORMAL HIGH (ref 70–99)
Phosphorus: 4.3 mg/dL (ref 2.5–4.6)
Potassium: 4.4 mmol/L (ref 3.5–5.1)
Sodium: 131 mmol/L — ABNORMAL LOW (ref 135–145)

## 2021-08-12 MED ORDER — OXYCODONE HCL 5 MG PO TABS
10.0000 mg | ORAL_TABLET | Freq: Once | ORAL | Status: AC
  Administered 2021-08-12: 10 mg via ORAL
  Filled 2021-08-12: qty 2

## 2021-08-12 NOTE — Treatment Plan (Signed)
Patient has decided to move forward with BKA later in this week.  We will sign off at this point.  Please let us know if there are further issues that we can be of assistance with ? ?Lanae Crumbly, DPM ?08/12/2021 ? ? ?

## 2021-08-12 NOTE — Progress Notes (Signed)
Called to patients room by family member, Patient crying in pain to right foot stating its 10/10 pain. No scheduled or PRN meds due at this time. Paged MD. Waiting on return phone call.  ?

## 2021-08-12 NOTE — TOC Progression Note (Signed)
Transition of Care (TOC) - Progression Note  ? ? ?Patient Details  ?Name: Laynie Espy ?MRN: [id number] ?Date of Birth: Apr 05, 1971 ? ?Transition of Care (TOC) CM/SW Contact  ?Vinie Sill, LCSW ?Phone Number: ?08/12/2021, 1:50 PM ? ?Clinical Narrative:    ? ?CSW contacted [facility]), she offered short term rehab at their facility. CSW informed of dialysis schedule.  ? ?CSW spoke with the patient's daughter and informed of SNF offer.  ? ?TOC will continue to follow and assist with discharge planning.  ? ?Thurmond Butts, MSW, LCSW ?Clinical Social Worker ? ? ? ? ?Expected Discharge Plan: [place] ?Barriers to Discharge: Ship broker, Continued Medical Work up, SNF Pending bed offer ? ?Expected Discharge Plan and Services ?Expected Discharge Plan: [place] ?In-house Referral: Clinical Social Work ?  ?  ?  ?                ?  ?  ?  ?  ?  ?  ?  ?  ?  ?  ? ? ?Social Determinants of Health (SDOH) Interventions ?  ? ?Readmission Risk Interventions ? ?  07/12/2021  ? 12:38 PM 05/23/2021  ?  1:12 PM  ?Readmission Risk Prevention Plan  ?Transportation Screening Complete Complete  ?Medication Review Press photographer) Complete Complete  ?PCP or Specialist appointment within 3-5 days of discharge Complete Complete  ?[place] or Home Care Consult Complete Complete  ?SW Recovery Care/Counseling Consult Complete Complete  ?Palliative Care Screening Not Applicable Not Applicable  ?Skilled Nursing Facility Complete Not Applicable  ? ? ?

## 2021-08-12 NOTE — Progress Notes (Signed)
?[facility] ?Progress Note  ? ?Subjective:    ?Plans for BKA this week - she says Wed.  Only c/o is foot pain.   ? ?Objective ?Vitals:  ? 08/11/21 1248 08/11/21 1709 08/11/21 2200 08/12/21 0333  ?BP: (!) 153/74 140/88 133/67   ?Pulse:  80 80   ?Resp: 15 20 18 18   ?Temp: 98.2 ?F (36.8 ?C) 98.2 ?F (36.8 ?C) 98.1 ?F (36.7 ?C)   ?TempSrc: Oral Oral Oral   ?SpO2: 93% 98%    ?Weight:    (!) 163.7 kg  ?Height:      ? ?Physical Exam ?General: Obese woman, on nasal O2, nad  ?Heart: Distant No m,r,g  ?Lungs: Clear bilaterally  ?Abdomen: soft, non -tender ?Extremities: unable to palpate d/t R foot pain, dusky toes R foot  ?Dialysis Access: R IJ TDC  ?  ?Filed Weights  ? 08/10/21 1125 08/11/21 0300 08/12/21 0333  ?Weight: (!) 169.1 kg (!) 165.7 kg (!) 163.7 kg  ? ?No intake or output data in the 24 hours ending 08/12/21 0821 ? ? ?Additional Objective ?Labs: ?Basic Metabolic Panel: ?Recent Labs  ?Lab 08/08/21 ?3536 08/09/21 ?0242 08/10/21 ?0258 08/12/21 ?1443  ?NA 134* 135 135 131*  ?K 4.4 4.2 4.2 4.4  ?CL 97* 99 97* 94*  ?CO2 22 22 23 22   ?GLUCOSE 82 96 91 119*  ?BUN 32* 21* 27* 26*  ?CREATININE 11.66* 9.25* 11.58* 9.77*  ?CALCIUM 7.7* 7.7* 7.8* 7.5*  ?PHOS 5.2*  --  5.1* 4.3  ? ? ?Liver Function Tests: ?Recent Labs  ?Lab 08/08/21 ?0620 08/12/21 ?0238  ?ALBUMIN 2.9* 2.8*  ? ? ?No results for input(s): LIPASE, AMYLASE in the last 168 hours. ?CBC: ?Recent Labs  ?Lab 08/06/21 ?1956 08/07/21 ?1540 08/08/21 ?0867 08/10/21 ?6195 08/12/21 ?0932  ?WBC 8.5 7.4 7.0 9.4 13.2*  ?NEUTROABS  --   --   --  7.0 9.1*  ?HGB 7.8* 8.1* 7.8* 7.6* 8.5*  ?HCT 28.7* 28.3* 26.4* 26.1* 28.2*  ?MCV 102.1* 100.4* 98.5 97.8 95.3  ?PLT 178 219 211 252 299  ? ? ?Blood Culture ?   ?Component Value Date/Time  ? SDES BLOOD LEFT HAND 09/03/2020 2359  ? SPECREQUEST  09/03/2020 2359  ?  BOTTLES DRAWN AEROBIC AND ANAEROBIC Blood Culture adequate volume  ? CULT  09/03/2020 2359  ?  NO GROWTH 5 DAYS ?Performed at [facility], [address]  [place], [place] [address] ?  ? REPTSTATUS 09/09/2020 FINAL 09/03/2020 2359  ? ? ?Cardiac Enzymes: ?No results for input(s): CKTOTAL, CKMB, CKMBINDEX, TROPONINI in the last 168 hours. ?CBG: ?Recent Labs  ?Lab 08/11/21 ?5809 08/11/21 ?1247 08/11/21 ?1654 08/11/21 ?2046 08/12/21 ?9833  ?GLUCAP 107* 103* 89 176* 103*  ? ? ?Iron Studies: No results for input(s): IRON, TIBC, TRANSFERRIN, FERRITIN in the last 72 hours. ?Lab Results  ?Component Value Date  ? INR 1.3 (H) 07/10/2021  ? ?Studies/Results: ?VAS [place] UPPER EXTREMITY ARTERIAL DUPLEX ? ?Result Date: 08/11/2021 ? UPPER EXTREMITY DUPLEX STUDY Patient Name:  Courtney Gray  Date of Exam:   08/11/2021 Medical Rec #: [id number]        Accession #:    [id number] Date of Birth: April 12, 1970         Patient Gender: F Patient Age:   51 years Exam Location:  [facility] Procedure:      VAS [place] UPPER EXTREMITY ARTERIAL DUPLEX Referring Phys: Dagoberto Ligas --------------------------------------------------------------------------------  Indications: Painful wound on left third digit. Unable to palpate left radial  pulse per note  Other Factors: PAD (right foot and toe gangrene) Limitations: Body habitus (BMI 60.79), patient unable to rest back on bed              secondary to foot pain and SOB. Unable to keep arm rotated or              still. Comparison Study: No prior study Performing Technologist: Sharion Dove RVS  Examination Guidelines: A complete evaluation includes B-mode imaging, spectral Doppler, color Doppler, and power Doppler as needed of all accessible portions of each vessel. Bilateral testing is considered an integral part of a complete examination. Limited examinations for reoccurring indications may be performed as noted.  Left Doppler Findings: +--------------+----------+-----------+--------+-------------------------------+ Site          PSV (cm/s)Waveform   StenosisComments                         +--------------+----------+-----------+--------+-------------------------------+ Subclavian Mid84        multiphasic                                        +--------------+----------+-----------+--------+-------------------------------+ Axillary      65        multiphasic                                        +--------------+----------+-----------+--------+-------------------------------+ Brachial Prox 83        multiphasic                                        +--------------+----------+-----------+--------+-------------------------------+ Brachial Mid  114       multiphasic                                        +--------------+----------+-----------+--------+-------------------------------+ Brachial Dist 101       multiphasic                                        +--------------+----------+-----------+--------+-------------------------------+ Radial Prox   128       multiphasic                                        +--------------+----------+-----------+--------+-------------------------------+ Radial Mid    99        multiphasic                                        +--------------+----------+-----------+--------+-------------------------------+ Radial Dist   207                          Possible 50-74% stenosis noted  in the distal radial            +--------------+----------+-----------+--------+-------------------------------+ Ulnar Prox    71        multiphasic                                        +--------------+----------+-----------+--------+-------------------------------+ Ulnar Mid     42        multiphasic                                        +--------------+----------+-----------+--------+-------------------------------+ Ulnar Dist                                 not visualized                  +--------------+----------+-----------+--------+-------------------------------+  Palmar Arch   87                           unable to visualize full palmar                                            arch or Doppler flow to finger                                             as patient could not keep hand                                             open or still                   +--------------+----------+-----------+--------+-------------------------------+   Summary:  Left: Obstruction noted in the Possible 50-74% stenosis noted in the       distal radial artery. unable to visualize full palmar arch or       Doppler flow to finger as patient could not keep hand open or       still. *See table(s) above for measurements and observations. Electronically signed by Harold Barban MD on 08/11/2021 at 10:01:35 PM.    Final    ? ?Medications: ? sodium chloride    ? sodium chloride    ?  ceFAZolin (ANCEF) IV Stopped (08/10/21 1505)  ? ? aspirin EC  81 mg Oral Q breakfast  ? atorvastatin  10 mg Oral QHS  ? calcitRIOL  1 mcg Oral Q T,Th,Sa-HD  ? calcium carbonate  1,500 mg Oral TID with meals  ? calcium carbonate  1.5 tablet Oral With snacks  ? carvedilol  6.25 mg Oral BID WC  ? cinacalcet  30 mg Oral Q T,Th,Sa-HD  ? darbepoetin (ARANESP) injection - DIALYSIS  200 mcg Intravenous Q Sat-HD  ? docusate sodium  100 mg Oral BID  ? feeding supplement (NEPRO CARB STEADY)  237 mL Oral TID WC  ? gabapentin  100 mg Oral BID  ? heparin injection (subcutaneous)  5,000 Units Subcutaneous Q8H  ? insulin aspart  0-6 Units Subcutaneous TID WC  ? lidocaine  1 patch Transdermal Q24H  ? lisinopril  20 mg Oral Daily  ? mupirocin ointment  1 application. Topical BID  ? neomycin-bacitracin-polymyxin   Topical BID  ? nystatin   Topical TID  ? pantoprazole  40 mg Oral Daily  ? polyethylene glycol  17 g Oral Daily  ? sodium chloride flush  10-40 mL Intracatheter Q12H  ? ? ?Dialysis Orders: ?[name] ? 4h 23min  165kg  1K/2.5Ca bath  TDC  400 BFR ? - 4/29 > HBsAg neg w/ Abs < 10 ? - mircera 200ug q2 ?  - calcitriol 1 ug tiw ? - sensipar 30 mg tiw ? ?Assessment/Plan: ?Bilateral foot pain - w/ ischemic changes R foot+ gangrenous toes x VVS consulted. S/p Bilat LE angiogram 5/3 per Dr. Virl Cagey note - severe calcific disease bilaterally.  Patient now agreeable to proceed with R BKA-likely procedure to be scheduled early-mid this week (she says Wed). ?ESRD -

## 2021-08-12 NOTE — Care Management Important Message (Signed)
Important Message ? ?Patient Details  ?Name: Courtney Gray ?MRN: [id number] ?Date of Birth: 01/14/1971 ? ? ?Medicare Important Message Given:  Yes ? ? ? ? ?Shelda Altes ?08/12/2021, 8:37 AM ?

## 2021-08-12 NOTE — Progress Notes (Signed)
?PROGRESS NOTE ? ? ? ?Courtney Gray  XKP:[id number] DOB: 06-04-1970 DOA: 08/03/2021 ?PCP: [facility], [facility]  ? ?Brief Narrative:  ?51 y.o. female with medical history significant of end-stage renal disease on hemodialysis, gout, diabetes mellitus type II, hypertension who just discharged from rehab this past Monday and was told that she had run out of her days and that she needed to pay out-of-pocket for any further rehab.  She presented again with worsening right foot pain and there was a concern for ischemic foot.  She was admitted and transferred to [facility].  CT angiogram was inconclusive.  She underwent arteriogram on 08/07/2021.  Podiatry following. ? ?Assessment & Plan: ?  ?Ischemic right foot ?-Evidence of gangrenous changes in some of her toes of the right foot. ?-CT angiogram was inconclusive due to significant calcification. ?-Vascular surgery following: Status post arteriogram on 08/07/2021 which showed severe calcific disease in bilateral lower extremities with no endovascular or open surgical options to improve perfusion.  IV heparin subsequently discontinued by vascular surgery. ?-Podiatry also following.  Recommended to start IV Ancef.  Podiatry has recommended transmet or [place] level.  Vascular is a following.  Patient has made a decision to proceed with right BKA which is going to be scheduled for Wednesday by vascular surgery. ? ?End-stage renal disease on hemodialysis ?-Nephrology following.  Dialysis as per nephrology schedule ? ?Chronic respiratory failure with hypoxia ?-Chronically on 3 L oxygen via nasal cannula.  Respiratory status currently stable ? ?Diabetes mellitus type 2 with hypoglycemia ?-A1c 5.7. ?-Still has intermittent episodes of hypoglycemia.  Continue CBGs. ?-Glipizide and Lantus were discontinued during last hospitalization because of episodes of hypoglycemia.  Continue SSI. ? ?Essential hypertension ?Blood pressure now better.  On Coreg.  Amlodipine on hold.  We  will watch another day. ? ?Anemia of chronic kidney disease ?Macrocytosis ?-Hemoglobin currently stable.  Transfuse if hemoglobin is less than 7 ? ?Hyperlipidemia ?-continue statin ? ?Hypersomnolence ?-Likely due to combination of illness and medications.  Gabapentin dose has been decreased.  Judicious use of narcotics.  Feeling much better and fully awake and alert Since 08/10/2021. ? ?History of hemorrhoids ?-Stable ? ?Questionable history of gout ?-apparently treated for gout of her right foot recently ? ?Morbid obesity ?-Outpatient follow-up ? ? ?DVT prophylaxis: Subcutaneous heparin  ?code Status: Full ?Family Communication: None at bedside ?Disposition Plan: ?Status is: Inpatient ?Remains inpatient appropriate because: Of severity of illness, patient to make decision about surgical options. ? ? ?Consultants: Vascular surgery/podiatry/nephrology ? ?Procedures: Left lower extremity arteriogram on 08/07/2021 ?Antimicrobials: None ? ? ?Subjective: ? ?Seen and examined.  No new complaint other than right foot pain.  She has made her decision. ? ?Objective: ?Vitals:  ? 08/11/21 1709 08/11/21 2200 08/12/21 0333 08/12/21 0819  ?BP: 140/88 133/67  (!) 149/73  ?Pulse: 80 80  73  ?Resp: 20 18 18 19   ?Temp: 98.2 ?F (36.8 ?C) 98.1 ?F (36.7 ?C)  97.9 ?F (36.6 ?C)  ?TempSrc: Oral Oral  Oral  ?SpO2: 98%   100%  ?Weight:   (!) 163.7 kg   ?Height:      ? ?No intake or output data in the 24 hours ending 08/12/21 1015 ? ?Filed Weights  ? 08/10/21 1125 08/11/21 0300 08/12/21 0333  ?Weight: (!) 169.1 kg (!) 165.7 kg (!) 163.7 kg  ? ? ?Examination: ? ?General exam: Appears calm and comfortable  ?Respiratory system: Clear to auscultation. Respiratory effort normal. ?Cardiovascular system: S1 & S2 heard, RRR. No JVD, murmurs, rubs, gallops  or clicks. No pedal edema. ?Gastrointestinal system: Abdomen is nondistended, soft and nontender. No organomegaly or masses felt. Normal bowel sounds heard. ?Central nervous system: Alert and oriented.  No focal neurological deficits. ?Extremities: Symmetric 5 x 5 power. ?Skin: Dry gangrene right second third and fourth toe. ?Psychiatry: Judgement and insight appear normal. Mood & affect appropriate.  ? ?Data Reviewed: I have personally reviewed following labs and imaging studies ? ?CBC: ?Recent Labs  ?Lab 08/06/21 ?1956 08/07/21 ?8502 08/08/21 ?7741 08/10/21 ?2878 08/12/21 ?6767  ?WBC 8.5 7.4 7.0 9.4 13.2*  ?NEUTROABS  --   --   --  7.0 9.1*  ?HGB 7.8* 8.1* 7.8* 7.6* 8.5*  ?HCT 28.7* 28.3* 26.4* 26.1* 28.2*  ?MCV 102.1* 100.4* 98.5 97.8 95.3  ?PLT 178 219 211 252 299  ? ? ?Basic Metabolic Panel: ?Recent Labs  ?Lab 08/07/21 ?2094 08/08/21 ?7096 08/09/21 ?2836 08/10/21 ?6294 08/12/21 ?7654  ?NA 135 134* 135 135 131*  ?K 4.1 4.4 4.2 4.2 4.4  ?CL 97* 97* 99 97* 94*  ?CO2 24 22 22 23 22   ?GLUCOSE 64* 82 96 91 119*  ?BUN 24* 32* 21* 27* 26*  ?CREATININE 10.11* 11.66* 9.25* 11.58* 9.77*  ?CALCIUM 7.8* 7.7* 7.7* 7.8* 7.5*  ?PHOS  --  5.2*  --  5.1* 4.3  ? ? ?GFR: ?Estimated Creatinine Clearance: 10.7 mL/min (A) (by C-G formula based on SCr of 9.77 mg/dL (H)). ?Liver Function Tests: ?Recent Labs  ?Lab 08/08/21 ?0620 08/12/21 ?0238  ?ALBUMIN 2.9* 2.8*  ? ? ?No results for input(s): LIPASE, AMYLASE in the last 168 hours. ?No results for input(s): AMMONIA in the last 168 hours. ?Coagulation Profile: ?No results for input(s): INR, PROTIME in the last 168 hours. ?Cardiac Enzymes: ?No results for input(s): CKTOTAL, CKMB, CKMBINDEX, TROPONINI in the last 168 hours. ? ?BNP (last 3 results) ?No results for input(s): PROBNP in the last 8760 hours. ?HbA1C: ?No results for input(s): HGBA1C in the last 72 hours. ?CBG: ?Recent Labs  ?Lab 08/11/21 ?6503 08/11/21 ?1247 08/11/21 ?1654 08/11/21 ?2046 08/12/21 ?5465  ?GLUCAP 107* 103* 89 176* 103*  ? ? ?Lipid Profile: ?No results for input(s): CHOL, HDL, LDLCALC, TRIG, CHOLHDL, LDLDIRECT in the last 72 hours. ? ?Thyroid Function Tests: ?No results for input(s): TSH, T4TOTAL, FREET4, T3FREE,  THYROIDAB in the last 72 hours. ?Anemia Panel: ?No results for input(s): VITAMINB12, FOLATE, FERRITIN, TIBC, IRON, RETICCTPCT in the last 72 hours. ?Sepsis Labs: ?No results for input(s): PROCALCITON, LATICACIDVEN in the last 168 hours. ? ?No results found for this or any previous visit (from the past 240 hour(s)).  ? ? ? ? ? ?Radiology Studies: ?VAS [place] UPPER EXTREMITY ARTERIAL DUPLEX ? ?Result Date: 08/11/2021 ? UPPER EXTREMITY DUPLEX STUDY Patient Name:  LORANDA MASTEL  Date of Exam:   08/11/2021 Medical Rec #: [id number]        Accession #:    [id number] Date of Birth: 12-01-1970         Patient Gender: F Patient Age:   63 years [facility]am [facility] Procedure:      VAS [place] UPPER EXTREMITY ARTERIAL DUPLEX Referring Phys: Dagoberto Ligas --------------------------------------------------------------------------------  Indications: Painful wound on left third digit. Unable to palpate left radial              pulse per note  Other Factors: PAD (right foot and toe gangrene) Limitations: Body habitus (BMI 60.79), patient unable to rest back on bed  secondary to foot pain and SOB. Unable to keep arm rotated or              still. Comparison Study: No prior study Performing Technologist: Sharion Dove RVS  Examination Guidelines: A complete evaluation includes B-mode imaging, spectral Doppler, color Doppler, and power Doppler as needed of all accessible portions of each vessel. Bilateral testing is considered an integral part of a complete examination. Limited examinations for reoccurring indications may be performed as noted.  Left Doppler Findings: +--------------+----------+-----------+--------+-------------------------------+ Site          PSV (cm/s)Waveform   StenosisComments                        +--------------+----------+-----------+--------+-------------------------------+ Subclavian Mid84        multiphasic                                         +--------------+----------+-----------+--------+-------------------------------+ Axillary      65        multiphasic                                        +--------------+----------+-----------+--------+-------------------------------+ Brachia

## 2021-08-12 NOTE — Progress Notes (Addendum)
?  Progress Note ? ? ? ?08/12/2021 ?7:53 AM ?5 Days Post-Op ? ?Subjective:  Patient has decided to proceed with R BKA.  L third finger still painful ? ? ?Vitals:  ? 08/11/21 2200 08/12/21 0333  ?BP: 133/67   ?Pulse: 80   ?Resp: 18 18  ?Temp: 98.1 ?F (36.7 ?C)   ?SpO2:    ? ?Physical Exam: ?Lungs:  non labored ?Extremities:  R distal foot gangrene; L thrid finger wound ?Neurologic: A&O ? ?CBC ?   ?Component Value Date/Time  ? WBC 13.2 (H) 08/12/2021 0238  ? RBC 2.96 (L) 08/12/2021 0238  ? HGB 8.5 (L) 08/12/2021 0238  ? HCT 28.2 (L) 08/12/2021 0238  ? PLT 299 08/12/2021 0238  ? MCV 95.3 08/12/2021 0238  ? MCH 28.7 08/12/2021 0238  ? MCHC 30.1 08/12/2021 0238  ? RDW 18.0 (H) 08/12/2021 0238  ? LYMPHSABS 1.3 08/12/2021 0238  ? MONOABS 2.1 (H) 08/12/2021 0238  ? EOSABS 0.4 08/12/2021 0238  ? BASOSABS 0.1 08/12/2021 0238  ? ? ?BMET ?   ?Component Value Date/Time  ? NA 131 (L) 08/12/2021 0238  ? K 4.4 08/12/2021 0238  ? CL 94 (L) 08/12/2021 0238  ? CO2 22 08/12/2021 0238  ? GLUCOSE 119 (H) 08/12/2021 0238  ? BUN 26 (H) 08/12/2021 0238  ? CREATININE 9.77 (H) 08/12/2021 0238  ? CALCIUM 7.5 (L) 08/12/2021 0238  ? GFRNONAA 4 (L) 08/12/2021 0238  ? GFRAA 6 (L) 11/29/2019 0120  ? ? ?INR ?   ?Component Value Date/Time  ? INR 1.3 (H) 07/10/2021 9450  ? ? ?No intake or output data in the 24 hours ending 08/12/21 0753 ? ? ?Assessment/Plan:  51 y.o. female with distal R foot gangrene; L thrid finger wound 5 Days Post-Op  ? ?Plan is for R BKA Wednesday with Dr. Carlis Abbott ?L UE arterial duplex shows a distal radial artery lesion.  If left finger shows no sign of improvement, she will be considered for left arm angiogram later this week ? ?Dagoberto Ligas, PA-C ?[facility] ?[phone] ?08/12/2021 ?7:53 AM ? ?I have seen and evaluated the patient. I agree with the PA note as documented above.  51 year old female with critical limb ischemia of the right lower extremity with tissue loss.  Underwent angiogram last week showing  inline flow down to the ankle and no outflow into the foot with significant small vessel disease.  Ultimately she has decided for more proximal amputation of the right lower extremity instead of a TMA with podiatry.  I have tentatively posted her for BKA on Wednesday.  She made multiple mentions that she wants one operation with the best chance of healing and I discussed about 40% of BKAs do not heal and that would be an above-knee amputation.  She will continue to think about her options.  Surgery Wednesday. ? ?Marty Heck, MD ?[facility] ?Office: [phone] ? ? ?

## 2021-08-13 DIAGNOSIS — M79673 Pain in unspecified foot: Secondary | ICD-10-CM | POA: Diagnosis not present

## 2021-08-13 DIAGNOSIS — I998 Other disorder of circulatory system: Secondary | ICD-10-CM | POA: Diagnosis not present

## 2021-08-13 LAB — RENAL FUNCTION PANEL
Albumin: 2.7 g/dL — ABNORMAL LOW (ref 3.5–5.0)
Anion gap: 15 (ref 5–15)
BUN: 33 mg/dL — ABNORMAL HIGH (ref 6–20)
CO2: 21 mmol/L — ABNORMAL LOW (ref 22–32)
Calcium: 7.5 mg/dL — ABNORMAL LOW (ref 8.9–10.3)
Chloride: 96 mmol/L — ABNORMAL LOW (ref 98–111)
Creatinine, Ser: 12.02 mg/dL — ABNORMAL HIGH (ref 0.44–1.00)
GFR, Estimated: 3 mL/min — ABNORMAL LOW (ref >60)
Glucose, Bld: 129 mg/dL — ABNORMAL HIGH (ref 70–99)
Phosphorus: 5.2 mg/dL — ABNORMAL HIGH (ref 2.5–4.6)
Potassium: 3.9 mmol/L (ref 3.5–5.1)
Sodium: 132 mmol/L — ABNORMAL LOW (ref 135–145)

## 2021-08-13 LAB — GLUCOSE, CAPILLARY
Glucose-Capillary: 129 mg/dL — ABNORMAL HIGH (ref 70–99)
Glucose-Capillary: 140 mg/dL — ABNORMAL HIGH (ref 70–99)
Glucose-Capillary: 171 mg/dL — ABNORMAL HIGH (ref 70–99)

## 2021-08-13 LAB — CBC
HCT: 29.4 % — ABNORMAL LOW (ref 36.0–46.0)
Hemoglobin: 8.5 g/dL — ABNORMAL LOW (ref 12.0–15.0)
MCH: 27.8 pg (ref 26.0–34.0)
MCHC: 28.9 g/dL — ABNORMAL LOW (ref 30.0–36.0)
MCV: 96.1 fL (ref 80.0–100.0)
Platelets: UNDETERMINED 10*3/uL (ref 150–400)
RBC: 3.06 MIL/uL — ABNORMAL LOW (ref 3.87–5.11)
RDW: 18.1 % — ABNORMAL HIGH (ref 11.5–15.5)
WBC: 14.1 10*3/uL — ABNORMAL HIGH (ref 4.0–10.5)
nRBC: 0.2 % (ref 0.0–0.2)

## 2021-08-13 MED ORDER — HYDROMORPHONE HCL 1 MG/ML IJ SOLN
0.5000 mg | Freq: Once | INTRAMUSCULAR | Status: AC
  Administered 2021-08-13: 0.5 mg via INTRAVENOUS

## 2021-08-13 MED ORDER — DARBEPOETIN ALFA 200 MCG/0.4ML IJ SOSY
200.0000 ug | PREFILLED_SYRINGE | INTRAMUSCULAR | Status: DC
  Filled 2021-08-13 (×2): qty 0.4

## 2021-08-13 MED ORDER — LABETALOL HCL 5 MG/ML IV SOLN: 10.0000 mg | INTRAVENOUS | Status: DC | PRN

## 2021-08-13 MED ORDER — DARBEPOETIN ALFA 200 MCG/0.4ML IJ SOSY
200.0000 ug | PREFILLED_SYRINGE | INTRAMUSCULAR | Status: DC
  Filled 2021-08-13: qty 0.4

## 2021-08-13 NOTE — Plan of Care (Signed)
  Problem: Health Behavior/Discharge Planning: Goal: Ability to manage health-related needs will improve Outcome: Progressing   Problem: Clinical Measurements: Goal: Ability to maintain clinical measurements within normal limits will improve Outcome: Progressing Goal: Will remain free from infection Outcome: Progressing   Problem: Activity: Goal: Risk for activity intolerance will decrease Outcome: Progressing   

## 2021-08-13 NOTE — Progress Notes (Signed)
[facility] [place] ? ?Subjective  -seen in dialysis ? ? ?Objective ?(!) 122/42 ?81 ?98 ?F (36.7 ?C) (Oral) ?14 ?92% ?No intake or output data in the 24 hours ending 08/13/21 0836 ? ? ? ? ?Laboratory ?Lab Results: ?Recent Labs  ?  08/12/21 ?1093  ?WBC 13.2*  ?HGB 8.5*  ?HCT 28.2*  ?PLT 299  ? ?BMET ?Recent Labs  ?  08/12/21 ?0238 08/13/21 ?0619  ?NA 131* 132*  ?K 4.4 3.9  ?CL 94* 96*  ?CO2 22 21*  ?GLUCOSE 119* 129*  ?BUN 26* 33*  ?CREATININE 9.77* 12.02*  ?CALCIUM 7.5* 7.5*  ? ? ?COAG ?Lab Results  ?Component Value Date  ? INR 1.3 (H) 07/10/2021  ? ?No results found for: PTT ? ?Assessment/Planning: ? ?51 year old female with end-stage renal disease and diabetes presents with critical limb ischemia with tissue loss in the right lower extremity.  She underwent angiogram that shows inline flow down to the ankle through the AT and no flow into the foot with significant small vessel disease.  She was seen by podiatry and offered a TMA that would be high risk and instead has opted for more proximal amputation.  I have offered her a BKA but she instead would prefer an AKA as she does not want multiple operations and wants one operation with the best chance of healing.  OR tomorrow.  N.p.o. after midnight. ? ?Marty Heck ?08/13/2021 ?8:36 AM ?-- ? ? ?

## 2021-08-13 NOTE — Progress Notes (Signed)
?PROGRESS NOTE ? ? ? ?Farrah Skoda  LGX:[id number] DOB: Dec 23, 1970 DOA: 08/03/2021 ?PCP: [facility]  ? ?Brief Narrative:  ?51 y.o. female with medical history significant of end-stage renal disease on hemodialysis, gout, diabetes mellitus type II, hypertension who just discharged from rehab this past Monday and was told that she had run out of her days and that she needed to pay out-of-pocket for any further rehab.  She presented again with worsening right foot pain and there was a concern for ischemic foot.  She was admitted and transferred to [facility].  CT angiogram was inconclusive.  She underwent arteriogram on 08/07/2021.  Podiatry following. ? ?Assessment & Plan: ?  ?Ischemic right foot ?-Evidence of gangrenous changes in some of her toes of the right foot. ?-CT angiogram was inconclusive due to significant calcification. ?-Vascular surgery following: Status post arteriogram on 08/07/2021 which showed severe calcific disease in bilateral lower extremities with no endovascular or open surgical options to improve perfusion.  IV heparin subsequently discontinued by vascular surgery. ?-Podiatry also following.  Recommended to start IV Ancef.  Podiatry recommended transmet or [place] level.  Vascular is a following.  Patient instead has made a decision to proceed with right AKA since she does not want to have multiple surgery and wants to have the surgery with the best chances and vascular had recommended her the best chance would be with AKA.  She is a scheduled for OR tomorrow. ? ?End-stage renal disease on hemodialysis ?-Nephrology following.  Dialysis as per nephrology schedule ? ?Chronic respiratory failure with hypoxia ?-Chronically on 3 L oxygen via nasal cannula.  Respiratory status currently stable ? ?Diabetes mellitus type 2 with hypoglycemia ?-A1c 5.7. ?-Still has intermittent episodes of hypoglycemia.  Continue CBGs. ?-Glipizide and Lantus were discontinued during last  hospitalization because of episodes of hypoglycemia.  Continue SSI. ? ?Essential hypertension ?Blood pressure now better.  On Coreg.  Amlodipine on hold.  We will watch another day. ? ?Anemia of chronic kidney disease ?Macrocytosis ?-Hemoglobin currently stable.  Transfuse if hemoglobin is less than 7 ? ?Hyperlipidemia ?-continue statin ? ?Hypersomnolence ?-Likely due to combination of illness and medications.  Gabapentin dose has been decreased.  Judicious use of narcotics.  Feeling much better and fully awake and alert Since 08/10/2021. ? ?History of hemorrhoids ?-Stable ? ?Questionable history of gout ?-apparently treated for gout of her right foot recently ? ?Morbid obesity ?-Outpatient follow-up ? ? ?DVT prophylaxis: Subcutaneous heparin  ?code Status: Full ?Family Communication: None at bedside ?Disposition Plan: ?Status is: Inpatient ?Remains inpatient appropriate because: Of severity of illness, patient to make decision about surgical options. ? ? ?Consultants: Vascular surgery/podiatry/nephrology ? ?Procedures: Left lower extremity arteriogram on 08/07/2021 ?Antimicrobials: None ? ? ?Subjective: ? ?Seen and examined in dialysis unit.  No new complaint other than the right lower extremity pain.  She is already on 2 different opioid medications. ? ?Objective: ?Vitals:  ? 08/13/21 0800 08/13/21 0830 08/13/21 0900 08/13/21 0930  ?BP: (!) 189/114 (!) 122/42 (!) 165/53 (!) 142/47  ?Pulse: 79 81 77 81  ?Resp: 19 14 13  (!) 21  ?Temp:      ?TempSrc:      ?SpO2:      ?Weight:      ?Height:      ? ?No intake or output data in the 24 hours ending 08/13/21 0958 ? ?Filed Weights  ? 08/12/21 0333 08/13/21 0359 08/13/21 0703  ?Weight: (!) 163.7 kg (!) 161.2 kg (!) 161.2 kg  ? ? ?  Examination: ? ?General exam: Appears calm and comfortable, morbidly obese ?Respiratory system: Clear to auscultation. Respiratory effort normal. ?Cardiovascular system: S1 & S2 heard, RRR. No JVD, murmurs, rubs, gallops or clicks. No pedal  edema. ?Gastrointestinal system: Abdomen is nondistended, soft and nontender. No organomegaly or masses felt. Normal bowel sounds heard. ?Central nervous system: Alert and oriented. No focal neurological deficits. ?Extremities: Symmetric 5 x 5 power. ?Skin: Dry gangrene of the right second third and fourth toe. ?Psychiatry: Judgement and insight appear normal. Mood & affect appropriate.  ? ? ?Data Reviewed: I have personally reviewed following labs and imaging studies ? ?CBC: ?Recent Labs  ?Lab 08/07/21 ?2197 08/08/21 ?5883 08/10/21 ?2549 08/12/21 ?8264 08/13/21 ?1583  ?WBC 7.4 7.0 9.4 13.2* 14.1*  ?NEUTROABS  --   --  7.0 9.1*  --   ?HGB 8.1* 7.8* 7.6* 8.5* 8.5*  ?HCT 28.3* 26.4* 26.1* 28.2* 29.4*  ?MCV 100.4* 98.5 97.8 95.3 96.1  ?PLT 219 211 252 299 PLATELET CLUMPS NOTED ON SMEAR, UNABLE TO ESTIMATE  ? ? ?Basic Metabolic Panel: ?Recent Labs  ?Lab 08/08/21 ?0940 08/09/21 ?7680 08/10/21 ?8811 08/12/21 ?0315 08/13/21 ?9458  ?NA 134* 135 135 131* 132*  ?K 4.4 4.2 4.2 4.4 3.9  ?CL 97* 99 97* 94* 96*  ?CO2 22 22 23 22  21*  ?GLUCOSE 82 96 91 119* 129*  ?BUN 32* 21* 27* 26* 33*  ?CREATININE 11.66* 9.25* 11.58* 9.77* 12.02*  ?CALCIUM 7.7* 7.7* 7.8* 7.5* 7.5*  ?PHOS 5.2*  --  5.1* 4.3 5.2*  ? ? ?GFR: ?Estimated Creatinine Clearance: 8.6 mL/min (A) (by C-G formula based on SCr of 12.02 mg/dL (H)). ?Liver Function Tests: ?Recent Labs  ?Lab 08/08/21 ?0620 08/12/21 ?0238 08/13/21 ?5929  ?ALBUMIN 2.9* 2.8* 2.7*  ? ? ?No results for input(s): LIPASE, AMYLASE in the last 168 hours. ?No results for input(s): AMMONIA in the last 168 hours. ?Coagulation Profile: ?No results for input(s): INR, PROTIME in the last 168 hours. ?Cardiac Enzymes: ?No results for input(s): CKTOTAL, CKMB, CKMBINDEX, TROPONINI in the last 168 hours. ? ?BNP (last 3 results) ?No results for input(s): PROBNP in the last 8760 hours. ?HbA1C: ?No results for input(s): HGBA1C in the last 72 hours. ?CBG: ?Recent Labs  ?Lab 08/12/21 ?0607 08/12/21 ?1139  08/12/21 ?1639 08/12/21 ?2057 08/13/21 ?0600  ?GLUCAP 103* 116* 158* 115* 129*  ? ? ?Lipid Profile: ?No results for input(s): CHOL, HDL, LDLCALC, TRIG, CHOLHDL, LDLDIRECT in the last 72 hours. ? ?Thyroid Function Tests: ?No results for input(s): TSH, T4TOTAL, FREET4, T3FREE, THYROIDAB in the last 72 hours. ?Anemia Panel: ?No results for input(s): VITAMINB12, FOLATE, FERRITIN, TIBC, IRON, RETICCTPCT in the last 72 hours. ?Sepsis Labs: ?No results for input(s): PROCALCITON, LATICACIDVEN in the last 168 hours. ? ?No results found for this or any previous visit (from the past 240 hour(s)).  ? ? ? ? ? ?Radiology Studies: ?VAS [place] UPPER EXTREMITY ARTERIAL DUPLEX ? ?Result Date: 08/11/2021 ? UPPER EXTREMITY DUPLEX STUDY Patient Name:  MAXWELL MARTORANO  Date of Exam:   08/11/2021 Medical Rec #: [id number]        Accession #:    [id number] Date of Birth: [birth year]         Patient Gender: F Patient Age:   68 years Exam Location:  [facility] Procedure:      VAS [place] UPPER EXTREMITY ARTERIAL DUPLEX Referring Phys: Dagoberto Ligas --------------------------------------------------------------------------------  Indications: Painful wound on left third digit. Unable to palpate left radial  pulse per note  Other Factors: PAD (right foot and toe gangrene) Limitations: Body habitus (BMI 60.79), patient unable to rest back on bed              secondary to foot pain and SOB. Unable to keep arm rotated or              still. Comparison Study: No prior study Performing Technologist: Sharion Dove RVS  Examination Guidelines: A complete evaluation includes B-mode imaging, spectral Doppler, color Doppler, and power Doppler as needed of all accessible portions of each vessel. Bilateral testing is considered an integral part of a complete examination. Limited examinations for reoccurring indications may be performed as noted.  Left Doppler Findings: +--------------+----------+-----------+--------+-------------------------------+  Site          PSV (cm/s)Waveform   StenosisComments                        +--------------+----------+-----------+--------+-------------------------------+ Subclavian Mid84        multiphasic                                        +-----

## 2021-08-13 NOTE — Progress Notes (Signed)
Physical Therapy Treatment ?Patient Details ?Name: Courtney Gray ?MRN: [id number] ?DOB: 19-Feb-1971 ?Today's Date: 08/13/2021 ? ? ?History of Present Illness 51 y.o. female who just discharged from rehab this past Monday and was told that she had run out of her days and that she needed to pay out-of-pocket for any further rehab.  She presented again with worsening right foot pain and there was a concern for ischemic foot. Pt also reports she dropped her O2 canister on her R foot.  She was admitted and transferred to [facility].  CT angiogram was inconclusive.  She underwent arteriogram on 08/07/2021. Plan for R AKA on 5/10.  PMH: end-stage renal disease on hemodialysis, gout, diabetes mellitus type II, hypertension ? ?  ?PT Comments  ? ? Pt received in supine, agreeable to therapy session with goal of initiating transfer training. Pt reports improved pain control after getting IV pain meds earlier (although mildly drowsy during session) and eager to initiate OOB mobility. Pt able to perform lateral scoot transfer from bed>chair after verbal/visual demo with up to minA and mod cues for body mechanics/sequencing. Pt daughter present during session and encouraging. SpO2/HR WFL on 3L O2 [place]. Pt had multiple questions about therapies after surgery tomorrow and instructed on likely progression of therapies pending pain/medical stability post-op. Pt continues to benefit from PT services to progress toward functional mobility goals.   ?Recommendations for follow up therapy are one component of a multi-disciplinary discharge planning process, led by the attending physician.  Recommendations may be updated based on patient status, additional functional criteria and insurance authorization. ? ?Follow Up Recommendations ? Skilled nursing-short term rehab (<3 hours/day) ?  ?  ?Assistance Recommended at Discharge Frequent or constant Supervision/Assistance  ?Patient can return home with the following A lot of help with walking  and/or transfers;Assistance with cooking/housework;Assist for transportation;Help with stairs or ramp for entrance;A little help with bathing/dressing/bathroom ?  ?Equipment Recommendations ? Hospital bed;Other (comment) (continue to assess, may need mechanical lift vs slide board and wheelchair pending progress and surgical plan; all bariatric size)  ?  ?Recommendations for Other Services   ? ? ?  ?Precautions / Restrictions Precautions ?Precautions: Fall ?Precaution Comments: RLE pain (kept NWB for comfort 5/9) ?Restrictions ?Weight Bearing Restrictions: No  ?  ? ?Mobility ? Bed Mobility ?Overal bed mobility: Needs Assistance ?Bed Mobility: Supine to Sit ?  ?  ?Supine to sit: Min assist ?  ?  ?General bed mobility comments: pt modI for supine to long sit in bed but needing minA and increased time for advancing hips to EOB, transfer pad assist while scooting. ?  ? ?Transfers ?Overall transfer level: Needs assistance ?Equipment used: Sliding board ?Transfers: Sit to/from Stand, Bed to chair/wheelchair/BSC ?  ?  ?  ?  ?  ? Lateral/Scoot Transfers: Min assist, With slide board ?General transfer comment: Pt able to perform with mod cues for technique after initial demo and assist to stabilize slide board/chair while she scooted. Good carryover of instruction. Pt attempted squat at bedside prior to using slide board but was unable to fully lift her hips off the bed when standing with only L leg (pt NWB on RLE per preference due to pain) ?  ? ?Ambulation/Gait ?  ?  ?  ?  ?  ?  ?  ?General Gait Details: not attempted due to pain ? ? ? ?  ?Balance Overall balance assessment: Modified Independent ?Sitting-balance support: No upper extremity supported, Feet unsupported ?Sitting balance-[name] Scale: Good ?  ?  ?  ?  ?  Standing balance comment: pt unable to significantly lift hips off bed when attempting to stand with only LLE support ?  ?  ?  ?  ?  ?  ?  ?  ?  ?  ?  ?  ? ?  ?Cognition Arousal/Alertness: Awake/alert ?Behavior  During Therapy: [facility] for tasks assessed/performed ?Overall Cognitive Status: Within Functional Limits for tasks assessed ?  ?  ?  ?  ?  ?  ?  ?  ?  ?  ?  ?  ?  ?  ?  ?  ?General Comments: Pt drowsy due to premedication with IV pain meds but with improved pain tolerance today and cooperative, eager to progress mobility. Receptive to discussion on what to expect post-op as far as transfers and likely progression of therapies, pending post-op stability/pain. Pt likes to have all the information she can so she has time to process and reports "talking through" transfers with demo prior to attempting decreased her anxiety. ?  ?  ? ?  ?Exercises Other Exercises ?Other Exercises: verbal review for LE therex, pt reports she has been performing on her own when pain allows, likely would benefit from AKA HEP post-op. ? ?  ?General Comments   ?  ?  ? ?Pertinent Vitals/Pain Pain Assessment ?Pain Assessment: 0-10 ?Pain Score: 6  ?Pain Location: R toes/foot ?Pain Descriptors / Indicators: Discomfort ?Pain Intervention(s): Monitored during session, Repositioned, Other (comment), Premedicated before session (per pt she had IV pain meds earlier in day and has lidocaine patch on R foot)  ? ? ? ?PT Goals (current goals can now be found in the care plan section) Acute Rehab PT Goals ?Patient Stated Goal: decreased pain, increased independence ?PT Goal Formulation: With patient ?Progress towards PT goals: Progressing toward goals ? ?  ?Frequency ? ? ? Min 2X/week ? ? ? ?  ?PT Plan Current plan remains appropriate  ? ? ?   ?AM-PAC PT "6 Clicks" Mobility   ?Outcome Measure ? Help needed turning from your back to your side while in a flat bed without using bedrails?: A Little ?Help needed moving from lying on your back to sitting on the side of a flat bed without using bedrails?: A Little ?Help needed moving to and from a bed to a chair (including a wheelchair)?: A Lot (mod cues) ?Help needed standing up from a chair using your arms (e.g.,  wheelchair or bedside chair)?: Total ?Help needed to walk in hospital room?: Total ?Help needed climbing 3-5 steps with a railing? : Total ?6 Click Score: 11 ? ?  ?End of Session Equipment Utilized During Treatment: Oxygen ?Activity Tolerance: Patient tolerated treatment well ?Patient left: with call bell/phone within reach;in chair;with nursing/sitter in room (NT in room setting up her bed) ?Nurse Communication: Mobility status;Other (comment) (pt refusing EOB/OOB mobility) ?PT Visit Diagnosis: Other abnormalities of gait and mobility (R26.89);Muscle weakness (generalized) (M62.81);Difficulty in walking, not elsewhere classified (R26.2);Pain ?Pain - Right/Left:  (R foot/toes> L knee pain) ?Pain - part of body: Ankle and joints of foot;Knee ?  ? ? ?Time: 6606-3016 ?PT Time Calculation (min) (ACUTE ONLY): 36 min ? ?Charges:  $Therapeutic Activity: 23-37 mins          ?          ? ?Teyanna Thielman P., PTA ?Acute Rehabilitation Services ?Secure Chat Preferred 9a-5:30pm ?Office: [phone]  ? ? ?Kara Pacer Aleana Fifita ?08/13/2021, 6:11 PM ? ?

## 2021-08-13 NOTE — Anesthesia Preprocedure Evaluation (Addendum)
Anesthesia Evaluation  ?Patient identified by MRN, date of birth, ID band ?Patient awake ? ? ? ?Airway ?Mallampati: II ? ?TM Distance: >3 FB ? ? ? ? Dental ?  ?Pulmonary ? ?  ?breath sounds clear to auscultation ? ? ? ? ? ? Cardiovascular ?hypertension, + Past MI and + Peripheral Vascular Disease  ? ?Rhythm:Regular Rate:Normal ? ? ?  ?Neuro/Psych ?negative neurological ROS ?   ? GI/Hepatic ?negative GI ROS, Neg liver ROS,   ?Endo/Other  ?diabetes ? Renal/GU ?Renal disease  ? ?  ?Musculoskeletal ? ?(+) Arthritis ,  ? Abdominal ?  ?Peds ? Hematology ?  ?Anesthesia Other Findings ? ? Reproductive/Obstetrics ? ?  ? ? ? ? ? ? ? ? ? ? ? ? ? ?  ?  ? ? ? ? ? ? ? ?Anesthesia Physical ?Anesthesia Plan ? ?ASA: 3 ? ?Anesthesia Plan: General  ? ?Post-op Pain Management:   ? ?Induction: Intravenous ? ?PONV Risk Score and Plan: Ondansetron, Treatment may vary due to age or medical condition and Dexamethasone ? ?Airway Management Planned: Oral ETT and Video Laryngoscope Planned ? ?Additional Equipment:  ? ?Intra-op Plan:  ? ?Post-operative Plan: Possible Post-op intubation/ventilation ? ?Informed Consent: I have reviewed the patients History and Physical, chart, labs and discussed the procedure including the risks, benefits and alternatives for the proposed anesthesia with the patient or authorized representative who has indicated his/her understanding and acceptance.  ? ? ? ?Dental advisory given ? ?Plan Discussed with: Anesthesiologist and CRNA ? ?Anesthesia Plan Comments:   ? ? ? ? ? ?Anesthesia Quick Evaluation ? ?

## 2021-08-13 NOTE — TOC Progression Note (Signed)
Transition of Care (TOC) - Progression Note  ? ? ?Patient Details  ?Name: Courtney Gray ?MRN: [id number] ?Date of Birth: Sep 18, 1970 ? ?Transition of Care (TOC) CM/SW Contact  ?Vinie Sill, LCSW ?Phone Number: ?08/13/2021, 1:54 PM ? ?Clinical Narrative:    ? ?CSW spoke with [facility](Debbie) - she confirmed the patient was there 04/7-04/24 for 17 days. She will have 3 days of full coverage before she will be in co-pay status. Patient has medicaid. CSW will discuss with family options regarding coverage at SNF and co-pays. ? ?Thurmond Butts, MSW, LCSW ?Clinical Social Worker ? ? ? ?Expected Discharge Plan: [place] ?Barriers to Discharge: Ship broker, Continued Medical Work up, SNF Pending bed offer ? ?Expected Discharge Plan and Services ?Expected Discharge Plan: [place] ?In-house Referral: Clinical Social Work ?  ?  ?  ?                ?  ?  ?  ?  ?  ?  ?  ?  ?  ?  ? ? ?Social Determinants of Health (SDOH) Interventions ?  ? ?Readmission Risk Interventions ? ?  07/12/2021  ? 12:38 PM 05/23/2021  ?  1:12 PM  ?Readmission Risk Prevention Plan  ?Transportation Screening Complete Complete  ?Medication Review Press photographer) Complete Complete  ?PCP or Specialist appointment within 3-5 days of discharge Complete Complete  ?[place] or Home Care Consult Complete Complete  ?SW Recovery Care/Counseling Consult Complete Complete  ?Palliative Care Screening Not Applicable Not Applicable  ?Skilled Nursing Facility Complete Not Applicable  ? ? ?

## 2021-08-13 NOTE — Progress Notes (Addendum)
removed 3310mls net fluid. System clotted with 15 minutes left in tx approx 159ml blood loss.  pt in severe pain in foot throughout tx,  nephrology wrote for one time dialudid dose in addition to schedule prn dilaudid dose, gave 2 doses 0.5 mg dilaudid on dialysis for sevre pain.  pt moaned throughout treatment bfr reduced to help with blood flow demand.  pre bp 132/66 post bp 153/73 pre weight 161.2kg post weight 157.5kg bed scales.  catheter ran well reversed, packed with heparin clamped and capped.  gave sensipar, calcitriol as ordered. ?

## 2021-08-13 NOTE — Plan of Care (Signed)
?  Problem: Health Behavior/Discharge Planning: ?Goal: Ability to manage health-related needs will improve ?Outcome: Progressing ?  ?Problem: Clinical Measurements: ?Goal: Will remain free from infection ?Outcome: Progressing ?  ?Problem: Activity: ?Goal: Risk for activity intolerance will decrease ?Outcome: Progressing ?  ?Problem: Elimination: ?Goal: Will not experience complications related to bowel motility ?Outcome: Progressing ?  ?Problem: Skin Integrity: ?Goal: Risk for impaired skin integrity will decrease ?Outcome: Progressing ?  ?

## 2021-08-13 NOTE — Progress Notes (Addendum)
[facility] Progress Note   Subjective:    Seen on HD.  Only c/o is foot pain.    Objective Vitals:   08/13/21 0703 08/13/21 0716 08/13/21 0730 08/13/21 0800  BP: (!) 143/106 132/66 (!) 150/74 (!) 189/114  Pulse: 75 72 73 79  Resp:  16 18 19   Temp: 98 F (36.7 C)     TempSrc: Oral     SpO2:      Weight: (!) 161.2 kg     Height:       Physical Exam General: Obese woman, on nasal O2, nad  Heart: Distant No m,r,g  Lungs: Clear bilaterally  Abdomen: soft, non -tender Extremities: unable to palpate d/t R foot pain, dusky toes R foot  Dialysis Access: R [facility]    Filed Weights   08/12/21 0333 08/13/21 0359 08/13/21 0703  Weight: (!) 163.7 kg (!) 161.2 kg (!) 161.2 kg   No intake or output data in the 24 hours ending 08/13/21 0821   Additional Objective Labs: Basic Metabolic Panel: Recent Labs  Lab 08/10/21 0258 08/12/21 0238 08/13/21 0619  NA 135 131* 132*  K 4.2 4.4 3.9  CL 97* 94* 96*  CO2 23 22 21*  GLUCOSE 91 119* 129*  BUN 27* 26* 33*  CREATININE 11.58* 9.77* 12.02*  CALCIUM 7.8* 7.5* 7.5*  PHOS 5.1* 4.3 5.2*    Liver Function Tests: Recent Labs  Lab 08/08/21 0620 08/12/21 0238 08/13/21 0619  ALBUMIN 2.9* 2.8* 2.7*    No results for input(s): LIPASE, AMYLASE in the last 168 hours. CBC: Recent Labs  Lab 08/06/21 1956 08/07/21 0614 08/08/21 0620 08/10/21 0258 08/12/21 0238  WBC 8.5 7.4 7.0 9.4 13.2*  NEUTROABS  --   --   --  7.0 9.1*  HGB 7.8* 8.1* 7.8* 7.6* 8.5*  HCT 28.7* 28.3* 26.4* 26.1* 28.2*  MCV 102.1* 100.4* 98.5 97.8 95.3  PLT 178 219 211 252 299    Blood Culture    Component Value Date/Time   SDES BLOOD LEFT HAND 09/03/2020 2359   SPECREQUEST  09/03/2020 2359    BOTTLES DRAWN AEROBIC AND ANAEROBIC Blood Culture adequate volume   CULT  09/03/2020 2359    NO GROWTH 5 DAYS Performed at [facility], [address] [place], [place] [address]    REPTSTATUS 09/09/2020 FINAL 09/03/2020 2359    Cardiac  Enzymes: No results for input(s): CKTOTAL, CKMB, CKMBINDEX, TROPONINI in the last 168 hours. CBG: Recent Labs  Lab 08/12/21 0607 08/12/21 1139 08/12/21 1639 08/12/21 2057 08/13/21 0600  GLUCAP 103* 116* 158* 115* 129*    Iron Studies: No results for input(s): IRON, TIBC, TRANSFERRIN, FERRITIN in the last 72 hours. Lab Results  Component Value Date   INR 1.3 (H) 07/10/2021   Studies/Results: VAS [place] UPPER EXTREMITY ARTERIAL DUPLEX  Result Date: 08/11/2021  UPPER EXTREMITY DUPLEX STUDY Patient Name:  Courtney Gray  Date of Exam:   08/11/2021 Medical Rec #: [id number]        Accession #:    [id number] Date of Birth: 1970-08-05         Patient Gender: F Patient Age:   51 years Exam Location:  [facility] Procedure:      VAS [place] UPPER EXTREMITY ARTERIAL DUPLEX Referring Phys: Emilie Rutter --------------------------------------------------------------------------------  Indications: Painful wound on left third digit. Unable to palpate left radial              pulse per note  Other Factors: PAD (right foot and  toe gangrene) Limitations: Body habitus (BMI 60.79), patient unable to rest back on bed              secondary to foot pain and SOB. Unable to keep arm rotated or              still. Comparison Study: No prior study Performing Technologist: Sherren Kerns RVS  Examination Guidelines: A complete evaluation includes B-mode imaging, spectral Doppler, color Doppler, and power Doppler as needed of all accessible portions of each vessel. Bilateral testing is considered an integral part of a complete examination. Limited examinations for reoccurring indications may be performed as noted.  Left Doppler Findings: +--------------+----------+-----------+--------+-------------------------------+ Site          PSV (cm/s)Waveform   StenosisComments                        +--------------+----------+-----------+--------+-------------------------------+ Subclavian Mid84        multiphasic                                         +--------------+----------+-----------+--------+-------------------------------+ Axillary      65        multiphasic                                        +--------------+----------+-----------+--------+-------------------------------+ Brachial Prox 83        multiphasic                                        +--------------+----------+-----------+--------+-------------------------------+ Brachial Mid  114       multiphasic                                        +--------------+----------+-----------+--------+-------------------------------+ Brachial Dist 101       multiphasic                                        +--------------+----------+-----------+--------+-------------------------------+ Radial Prox   128       multiphasic                                        +--------------+----------+-----------+--------+-------------------------------+ Radial Mid    99        multiphasic                                        +--------------+----------+-----------+--------+-------------------------------+ Radial Dist   207                          Possible 50-74% stenosis noted                                             in the distal radial            +--------------+----------+-----------+--------+-------------------------------+  Ulnar Prox    71        multiphasic                                        +--------------+----------+-----------+--------+-------------------------------+ Ulnar Mid     42        multiphasic                                        +--------------+----------+-----------+--------+-------------------------------+ Ulnar Dist                                 not visualized                  +--------------+----------+-----------+--------+-------------------------------+ Palmar Arch   87                           unable to visualize full palmar                                            arch or  Doppler flow to finger                                             as patient could not keep hand                                             open or still                   +--------------+----------+-----------+--------+-------------------------------+   Summary:  Left: Obstruction noted in the Possible 50-74% stenosis noted in the       distal radial artery. unable to visualize full palmar arch or       Doppler flow to finger as patient could not keep hand open or       still. *See table(s) above for measurements and observations. Electronically signed by Coral Else MD on 08/11/2021 at 10:01:35 PM.    Final     Medications:  sodium chloride     sodium chloride      ceFAZolin (ANCEF) IV Stopped (08/10/21 1505)    aspirin EC  81 mg Oral Q breakfast   atorvastatin  10 mg Oral QHS   calcitRIOL  1 mcg Oral Q T,Th,Sa-HD   calcium carbonate  1,500 mg Oral TID with meals   calcium carbonate  1.5 tablet Oral With snacks   carvedilol  6.25 mg Oral BID WC   cinacalcet  30 mg Oral Q T,Th,Sa-HD   darbepoetin (ARANESP) injection - DIALYSIS  200 mcg Intravenous Q Sat-HD   docusate sodium  100 mg Oral BID   feeding supplement (NEPRO CARB STEADY)  237 mL Oral TID WC   gabapentin  100 mg Oral BID   heparin injection (subcutaneous)  5,000 Units Subcutaneous Q8H   insulin aspart  0-6 Units Subcutaneous TID WC  lidocaine  1 patch Transdermal Q24H   lisinopril  20 mg Oral Daily   mupirocin ointment  1 application. Topical BID   neomycin-bacitracin-polymyxin   Topical BID   nystatin   Topical TID   pantoprazole  40 mg Oral Daily   polyethylene glycol  17 g Oral Daily   sodium chloride flush  10-40 mL Intracatheter Q12H    Dialysis Orders: [name]  4h  165kg  1K/2.5Ca bath  TDC  400 BFR  - 4/29 > HBsAg neg w/ Abs < 10  - mircera 200ug q2  - calcitriol 1 ug tiw  - sensipar 30 mg tiw  Assessment/Plan: Bilateral foot pain - w/ ischemic changes R foot+ gangrenous toes x VVS  consulted. S/p Bilat LE angiogram 5/3 per Dr. Karin Lieu note - severe calcific disease bilaterally.  Patient now agreeable to proceed with R BKA-likely procedure to be scheduled early-mid this week (she says Wed). ESRD - on HD TTS. Today on schedule. Severe ischemic foot pain with HD - lower BFR 34mL/min and give extra pain med dose to facilitate ongoing HD Volume - BP improving, lower BP with HD.  Not to EDW yet. Continue UF as able for volume. Tolerating UFG 3.5L. HTN - home meds per pmd as needed.  Anemia ckd - Hgb 8.5. Redose ESA with HD Sat. MBD ckd - CCa in range and phos ok. Cont sensipar and vdra w/ HD tiw. On tums as binder. Chronic hypoxic respiratory failure - on home oxygen  Nutrition-Alb 2.8. Ordered Nepro supplement.   Estill Bakes MD [facility] Pager [phone]

## 2021-08-14 ENCOUNTER — Other Ambulatory Visit: Payer: Self-pay

## 2021-08-14 ENCOUNTER — Inpatient Hospital Stay (HOSPITAL_COMMUNITY): Payer: 59 | Admitting: Certified Registered Nurse Anesthetist

## 2021-08-14 ENCOUNTER — Encounter (HOSPITAL_COMMUNITY)
Admission: EM | Disposition: A | Discharge status: Home / Self Care | Payer: Self-pay | Source: Home / Self Care | Attending: Family Medicine

## 2021-08-14 DIAGNOSIS — I252 Old myocardial infarction: Secondary | ICD-10-CM

## 2021-08-14 DIAGNOSIS — I96 Gangrene, not elsewhere classified: Secondary | ICD-10-CM | POA: Diagnosis not present

## 2021-08-14 DIAGNOSIS — I998 Other disorder of circulatory system: Secondary | ICD-10-CM | POA: Diagnosis not present

## 2021-08-14 DIAGNOSIS — M79673 Pain in unspecified foot: Secondary | ICD-10-CM | POA: Diagnosis not present

## 2021-08-14 DIAGNOSIS — E1152 Type 2 diabetes mellitus with diabetic peripheral angiopathy with gangrene: Secondary | ICD-10-CM

## 2021-08-14 DIAGNOSIS — M1990 Unspecified osteoarthritis, unspecified site: Secondary | ICD-10-CM

## 2021-08-14 DIAGNOSIS — I10 Essential (primary) hypertension: Secondary | ICD-10-CM

## 2021-08-14 HISTORY — PX: AMPUTATION: SHX166

## 2021-08-14 LAB — GLUCOSE, CAPILLARY
Glucose-Capillary: 145 mg/dL — ABNORMAL HIGH (ref 70–99)
Glucose-Capillary: 111 mg/dL — ABNORMAL HIGH (ref 70–99)
Glucose-Capillary: 154 mg/dL — ABNORMAL HIGH (ref 70–99)
Glucose-Capillary: 262 mg/dL — ABNORMAL HIGH (ref 70–99)
Glucose-Capillary: 315 mg/dL — ABNORMAL HIGH (ref 70–99)

## 2021-08-14 LAB — POCT I-STAT, CHEM 8
BUN: 26 mg/dL — ABNORMAL HIGH (ref 6–20)
Calcium, Ion: 0.96 mmol/L — ABNORMAL LOW (ref 1.15–1.40)
Chloride: 96 mmol/L — ABNORMAL LOW (ref 98–111)
Creatinine, Ser: 10.4 mg/dL — ABNORMAL HIGH (ref 0.44–1.00)
Glucose, Bld: 127 mg/dL — ABNORMAL HIGH (ref 70–99)
HCT: 29 % — ABNORMAL LOW (ref 36.0–46.0)
Hemoglobin: 9.9 g/dL — ABNORMAL LOW (ref 12.0–15.0)
Potassium: 3.6 mmol/L (ref 3.5–5.1)
Sodium: 134 mmol/L — ABNORMAL LOW (ref 135–145)
TCO2: 27 mmol/L (ref 22–32)

## 2021-08-14 SURGERY — AMPUTATION, ABOVE KNEE
Anesthesia: General | Site: Knee | Laterality: Right

## 2021-08-14 MED ORDER — CEFAZOLIN SODIUM-DEXTROSE 1-4 GM/50ML-% IV SOLN
1.0000 g | INTRAVENOUS | Status: DC
  Filled 2021-08-14: qty 50

## 2021-08-14 MED ORDER — CEFAZOLIN SODIUM-DEXTROSE 2-4 GM/100ML-% IV SOLN
2.0000 g | INTRAVENOUS | Status: DC
  Administered 2021-08-17: 2 g via INTRAVENOUS
  Filled 2021-08-14: qty 100

## 2021-08-14 MED ORDER — PROPOFOL 10 MG/ML IV BOLUS
INTRAVENOUS | Status: DC | PRN
  Administered 2021-08-14: 80 mg via INTRAVENOUS

## 2021-08-14 MED ORDER — METOPROLOL TARTRATE 5 MG/5ML IV SOLN: 2.0000 mg | INTRAVENOUS | Status: DC | PRN

## 2021-08-14 MED ORDER — SODIUM CHLORIDE 0.9% FLUSH
3.0000 mL | INTRAVENOUS | Status: DC | PRN
  Administered 2021-08-21: 3 mL via INTRAVENOUS

## 2021-08-14 MED ORDER — LIDOCAINE 2% (20 MG/ML) 5 ML SYRINGE
INTRAMUSCULAR | Status: DC | PRN
Start: 2021-08-14 — End: 2021-08-14
  Administered 2021-08-14: 40 mg via INTRAVENOUS

## 2021-08-14 MED ORDER — ONDANSETRON HCL 4 MG/2ML IJ SOLN
4.0000 mg | Freq: Four times a day (QID) | INTRAMUSCULAR | Status: DC | PRN
  Filled 2021-08-14: qty 2

## 2021-08-14 MED ORDER — SODIUM CHLORIDE 0.9 % IV SOLN: INTRAVENOUS | Status: DC | PRN

## 2021-08-14 MED ORDER — FENTANYL CITRATE (PF) 100 MCG/2ML IJ SOLN: 25.0000 ug | INTRAMUSCULAR | Status: DC | PRN

## 2021-08-14 MED ORDER — ROCURONIUM BROMIDE 10 MG/ML (PF) SYRINGE
PREFILLED_SYRINGE | INTRAVENOUS | Status: DC | PRN
  Administered 2021-08-14: 50 mg via INTRAVENOUS

## 2021-08-14 MED ORDER — PROPOFOL 10 MG/ML IV BOLUS
INTRAVENOUS | Status: AC
Start: 2021-08-14 — End: ?
  Filled 2021-08-14: qty 20

## 2021-08-14 MED ORDER — ONDANSETRON HCL 4 MG/2ML IJ SOLN
INTRAMUSCULAR | Status: DC | PRN
  Administered 2021-08-14: 4 mg via INTRAVENOUS

## 2021-08-14 MED ORDER — 0.9 % SODIUM CHLORIDE (POUR BTL) OPTIME
TOPICAL | Status: DC | PRN
  Administered 2021-08-14: 1000 mL

## 2021-08-14 MED ORDER — PHENYLEPHRINE 80 MCG/ML (10ML) SYRINGE FOR IV PUSH (FOR BLOOD PRESSURE SUPPORT)
PREFILLED_SYRINGE | INTRAVENOUS | Status: AC
  Filled 2021-08-14: qty 10

## 2021-08-14 MED ORDER — PHENYLEPHRINE HCL-NACL 20-0.9 MG/250ML-% IV SOLN
INTRAVENOUS | Status: DC | PRN
Start: 2021-08-14 — End: 2021-08-14
  Administered 2021-08-14: 50 ug/min via INTRAVENOUS

## 2021-08-14 MED ORDER — ALBUMIN HUMAN 5 % IV SOLN: INTRAVENOUS | Status: DC | PRN

## 2021-08-14 MED ORDER — DEXAMETHASONE SODIUM PHOSPHATE 10 MG/ML IJ SOLN
INTRAMUSCULAR | Status: AC
  Filled 2021-08-14: qty 1

## 2021-08-14 MED ORDER — HYDROMORPHONE HCL 1 MG/ML IJ SOLN
0.5000 mg | INTRAMUSCULAR | Status: DC | PRN
  Administered 2021-08-14: 1 mg via INTRAVENOUS
  Administered 2021-08-14: 0.5 mg via INTRAVENOUS
  Administered 2021-08-14: 1 mg via INTRAVENOUS
  Administered 2021-08-15 (×2): 0.5 mg via INTRAVENOUS
  Administered 2021-08-15 (×3): 1 mg via INTRAVENOUS
  Administered 2021-08-15: 0.5 mg via INTRAVENOUS
  Administered 2021-08-16 (×2): 1 mg via INTRAVENOUS
  Administered 2021-08-16: 0.5 mg via INTRAVENOUS
  Administered 2021-08-16 – 2021-08-27 (×29): 1 mg via INTRAVENOUS
  Filled 2021-08-14 (×7): qty 1
  Filled 2021-08-14: qty 0.5
  Filled 2021-08-14 (×13): qty 1
  Filled 2021-08-14: qty 0.5
  Filled 2021-08-14 (×7): qty 1
  Filled 2021-08-14: qty 0.5
  Filled 2021-08-14 (×13): qty 1

## 2021-08-14 MED ORDER — LIDOCAINE 2% (20 MG/ML) 5 ML SYRINGE
INTRAMUSCULAR | Status: AC
  Filled 2021-08-14: qty 5

## 2021-08-14 MED ORDER — MAGNESIUM SULFATE 2 GM/50ML IV SOLN: 2.0000 g | Freq: Every day | INTRAVENOUS | Status: DC | PRN

## 2021-08-14 MED ORDER — SODIUM CHLORIDE 0.9% FLUSH
3.0000 mL | Freq: Two times a day (BID) | INTRAVENOUS | Status: DC
  Administered 2021-08-14 – 2021-08-27 (×25): 3 mL via INTRAVENOUS

## 2021-08-14 MED ORDER — FENTANYL CITRATE (PF) 250 MCG/5ML IJ SOLN
INTRAMUSCULAR | Status: DC | PRN
  Administered 2021-08-14 (×2): 50 ug via INTRAVENOUS
  Administered 2021-08-14: 25 ug via INTRAVENOUS

## 2021-08-14 MED ORDER — FENTANYL CITRATE (PF) 250 MCG/5ML IJ SOLN
INTRAMUSCULAR | Status: AC
  Filled 2021-08-14: qty 5

## 2021-08-14 MED ORDER — SODIUM CHLORIDE 0.9 % IV SOLN: 250.0000 mL | INTRAVENOUS | Status: DC | PRN

## 2021-08-14 MED ORDER — ONDANSETRON HCL 4 MG/2ML IJ SOLN
INTRAMUSCULAR | Status: AC
  Filled 2021-08-14: qty 2

## 2021-08-14 MED ORDER — MIDAZOLAM HCL 2 MG/2ML IJ SOLN
INTRAMUSCULAR | Status: AC
  Filled 2021-08-14: qty 2

## 2021-08-14 MED ORDER — CEFAZOLIN SODIUM-DEXTROSE 2-3 GM-%(50ML) IV SOLR
INTRAVENOUS | Status: DC | PRN
  Administered 2021-08-14: 2 g via INTRAVENOUS

## 2021-08-14 MED ORDER — PROPOFOL 10 MG/ML IV BOLUS
INTRAVENOUS | Status: AC
  Filled 2021-08-14: qty 20

## 2021-08-14 MED ORDER — ROCURONIUM BROMIDE 10 MG/ML (PF) SYRINGE
PREFILLED_SYRINGE | INTRAVENOUS | Status: AC
  Filled 2021-08-14: qty 10

## 2021-08-14 MED ORDER — PHENYLEPHRINE 80 MCG/ML (10ML) SYRINGE FOR IV PUSH (FOR BLOOD PRESSURE SUPPORT)
PREFILLED_SYRINGE | INTRAVENOUS | Status: DC | PRN
  Administered 2021-08-14 (×3): 80 ug via INTRAVENOUS
  Administered 2021-08-14 (×2): 120 ug via INTRAVENOUS
  Administered 2021-08-14 (×2): 80 ug via INTRAVENOUS

## 2021-08-14 MED ORDER — HEPARIN SODIUM (PORCINE) 5000 UNIT/ML IJ SOLN
5000.0000 [IU] | Freq: Three times a day (TID) | INTRAMUSCULAR | Status: DC
  Administered 2021-08-15 – 2021-08-28 (×34): 5000 [IU] via SUBCUTANEOUS
  Filled 2021-08-14 (×37): qty 1

## 2021-08-14 MED ORDER — POTASSIUM CHLORIDE CRYS ER 20 MEQ PO TBCR: 20.0000 meq | EXTENDED_RELEASE_TABLET | Freq: Every day | ORAL | Status: DC | PRN

## 2021-08-14 MED ORDER — SUGAMMADEX SODIUM 500 MG/5ML IV SOLN
INTRAVENOUS | Status: DC | PRN
  Administered 2021-08-14 (×5): 100 mg via INTRAVENOUS

## 2021-08-14 MED ORDER — HEPARIN SODIUM (PORCINE) 1000 UNIT/ML IJ SOLN
1900.0000 [IU] | Freq: Once | INTRAMUSCULAR | Status: AC
  Administered 2021-08-14: 1900 [IU]
  Filled 2021-08-14: qty 1.9

## 2021-08-14 MED ORDER — MIDAZOLAM HCL 2 MG/2ML IJ SOLN
INTRAMUSCULAR | Status: DC | PRN
  Administered 2021-08-14: 1 mg via INTRAVENOUS

## 2021-08-14 MED ORDER — DEXAMETHASONE SODIUM PHOSPHATE 10 MG/ML IJ SOLN
INTRAMUSCULAR | Status: DC | PRN
  Administered 2021-08-14: 5 mg via INTRAVENOUS

## 2021-08-14 SURGICAL SUPPLY — 48 items
BAG COUNTER SPONGE SURGICOUNT (BAG) ×2 IMPLANT
BANDAGE ESMARK 6X9 LF (GAUZE/BANDAGES/DRESSINGS) ×1 IMPLANT
BLADE SAW SAG 73X25 THK (BLADE) ×1
BLADE SAW SGTL 73X25 THK (BLADE) ×1 IMPLANT
BNDG COHESIVE 6X5 TAN STRL LF (GAUZE/BANDAGES/DRESSINGS) ×2 IMPLANT
BNDG ELASTIC 4X5.8 VLCR STR LF (GAUZE/BANDAGES/DRESSINGS) ×3 IMPLANT
BNDG ELASTIC 6X5.8 VLCR STR LF (GAUZE/BANDAGES/DRESSINGS) ×2 IMPLANT
BNDG ESMARK 6X9 LF (GAUZE/BANDAGES/DRESSINGS) ×2
BNDG GAUZE ELAST 4 BULKY (GAUZE/BANDAGES/DRESSINGS) ×3 IMPLANT
CANISTER SUCT 3000ML PPV (MISCELLANEOUS) ×2 IMPLANT
CLIP VESOCCLUDE MED 6/CT (CLIP) ×2 IMPLANT
COVER SURGICAL LIGHT HANDLE (MISCELLANEOUS) ×4 IMPLANT
DRAPE DERMATAC (DRAPES) IMPLANT
DRAPE INCISE IOBAN 66X45 STRL (DRAPES) IMPLANT
DRAPE ORTHO SPLIT 77X108 STRL (DRAPES) ×2
DRAPE SURG ORHT 6 SPLT 77X108 (DRAPES) ×2 IMPLANT
DRAPE U-SHAPE 47X51 STRL (DRAPES) IMPLANT
DRESSING PREVENA PLUS CUSTOM (GAUZE/BANDAGES/DRESSINGS) IMPLANT
DRSG ADAPTIC 3X8 NADH LF (GAUZE/BANDAGES/DRESSINGS) ×3 IMPLANT
DRSG PREVENA PLUS CUSTOM (GAUZE/BANDAGES/DRESSINGS)
ELECT CAUTERY BLADE 6.4 (BLADE) ×2 IMPLANT
ELECT REM PT RETURN 9FT ADLT (ELECTROSURGICAL) ×2
ELECTRODE REM PT RTRN 9FT ADLT (ELECTROSURGICAL) ×1 IMPLANT
GAUZE SPONGE 4X4 12PLY STRL (GAUZE/BANDAGES/DRESSINGS) ×3 IMPLANT
GLOVE BIO SURGEON STRL SZ7.5 (GLOVE) ×2 IMPLANT
GLOVE BIOGEL PI IND STRL 8 (GLOVE) ×1 IMPLANT
GLOVE BIOGEL PI INDICATOR 8 (GLOVE) ×1
GOWN STRL REUS W/ TWL XL LVL3 (GOWN DISPOSABLE) ×1 IMPLANT
GOWN STRL REUS W/TWL XL LVL3 (GOWN DISPOSABLE) ×2
KIT BASIN OR (CUSTOM PROCEDURE TRAY) ×2 IMPLANT
KIT TURNOVER KIT B (KITS) ×2 IMPLANT
NS IRRIG 1000ML POUR BTL (IV SOLUTION) ×2 IMPLANT
PACK GENERAL/GYN (CUSTOM PROCEDURE TRAY) ×2 IMPLANT
PAD ARMBOARD 7.5X6 YLW CONV (MISCELLANEOUS) ×4 IMPLANT
PREVENA RESTOR ARTHOFORM 46X30 (CANNISTER) IMPLANT
SPONGE T-LAP 18X18 ~~LOC~~+RFID (SPONGE) ×1 IMPLANT
STAPLER VISISTAT 35W (STAPLE) ×3 IMPLANT
STOCKINETTE IMPERVIOUS LG (DRAPES) ×2 IMPLANT
SUT ETHILON 3 0 PS 1 (SUTURE) IMPLANT
SUT SILK 0 TIES 10X30 (SUTURE) ×1 IMPLANT
SUT SILK 2 0 (SUTURE)
SUT SILK 2 0 SH CR/8 (SUTURE) ×2 IMPLANT
SUT SILK 2-0 18XBRD TIE 12 (SUTURE) ×1 IMPLANT
SUT VIC AB 2-0 CT1 18 (SUTURE) ×5 IMPLANT
SUT VIC AB 3-0 SH 18 (SUTURE) ×1 IMPLANT
TOWEL GREEN STERILE (TOWEL DISPOSABLE) ×4 IMPLANT
UNDERPAD 30X36 HEAVY ABSORB (UNDERPADS AND DIAPERS) ×2 IMPLANT
WATER STERILE IRR 1000ML POUR (IV SOLUTION) ×2 IMPLANT

## 2021-08-14 NOTE — Anesthesia Procedure Notes (Signed)
Procedure Name: Intubation ?Date/Time: 08/14/2021 9:07 AM ?Performed by: Janene Harvey, CRNA ?Pre-anesthesia Checklist: Patient identified, Emergency Drugs available, Suction available and Patient being monitored ?Patient Re-evaluated:Patient Re-evaluated prior to induction ?Oxygen Delivery Method: Circle system utilized ?Preoxygenation: Pre-oxygenation with 100% oxygen ?Induction Type: IV induction ?Ventilation: Two handed mask ventilation required and Oral airway inserted - appropriate to patient size ?Laryngoscope Size: Glidescope and 3 ?Grade View: Grade I ?Tube type: Oral ?Tube size: 7.0 mm ?Number of attempts: 1 ?Airway Equipment and Method: Stylet and Oral airway ?Placement Confirmation: ETT inserted through vocal cords under direct vision, positive ETCO2 and breath sounds checked- equal and bilateral ?Secured at: 22 cm ?Tube secured with: Tape ?Dental Injury: Teeth and Oropharynx as per pre-operative assessment  ?Comments: Elective glidescope ? ? ? ? ?

## 2021-08-14 NOTE — Progress Notes (Signed)
Inpatient Rehabilitation Admissions Coordinator  ? ?[employer] Medicare payor trends will not approve [facility] admit for this diagnosis. Other rehab venues need to be pursued. We will not pursue CIR. ? ?Danne Baxter, RN, MSN ?Rehab Admissions Coordinator ?([phone] ?08/14/2021 1:21 PM ? ?

## 2021-08-14 NOTE — Progress Notes (Signed)
?PROGRESS NOTE ? ? ? ?[facility]  MIW:[id number] DOB: [birth year] DOA: 08/03/2021 ?PCP: [facility], [facility]  ? ?Brief Narrative:  ?51 y.o. female with medical history significant of end-stage renal disease on hemodialysis, gout, diabetes mellitus type II, hypertension who just discharged from rehab this past Monday and was told that she had run out of her days and that she needed to pay out-of-pocket for any further rehab.  She presented again with worsening right foot pain and there was a concern for ischemic foot.  She was admitted and transferred to [facility].  CT angiogram was inconclusive.  She underwent arteriogram on 08/07/2021.  Podiatry following. ? ?Assessment & Plan: ?  ?Ischemic right foot ?-Evidence of gangrenous changes in some of her toes of the right foot. ?-CT angiogram was inconclusive due to significant calcification. ?-Vascular surgery following: Status post arteriogram on 08/07/2021 which showed severe calcific disease in bilateral lower extremities with no endovascular or open surgical options to improve perfusion.  IV heparin subsequently discontinued by vascular surgery. ?-Podiatry also following.  Recommended to start IV Ancef.  Podiatry recommended transmet or [place] level.  Vascular is a following.  Patient instead has made a decision to proceed with right AKA since she does not want to have multiple surgery and wants to have the surgery with the best chances and vascular had recommended her the best chance would be with AKA.  She is a scheduled for OR today. ? ?End-stage renal disease on hemodialysis ?-Nephrology following.  Dialysis as per nephrology schedule ? ?Chronic respiratory failure with hypoxia ?-Chronically on 3 L oxygen via nasal cannula.  Respiratory status currently stable ? ?Diabetes mellitus type 2 with hypoglycemia ?-A1c 5.7. ?-Still has intermittent episodes of hypoglycemia.  Continue CBGs. ?-Glipizide and Lantus were discontinued during last hospitalization  because of episodes of hypoglycemia.  Continue SSI. ? ?Essential hypertension ?Blood pressure now better.  On Coreg.  Amlodipine on hold.  We will watch another day. ? ?Anemia of chronic kidney disease ?Macrocytosis ?-Hemoglobin currently stable.  Transfuse if hemoglobin is less than 7 ? ?Hyperlipidemia ?-continue statin ? ?Hypersomnolence ?-Likely due to combination of illness and medications.  Gabapentin dose has been decreased.  Judicious use of narcotics.  Feeling much better and fully awake and alert Since 08/10/2021. ? ?History of hemorrhoids ?-Stable ? ?Questionable history of gout ?-apparently treated for gout of her right foot recently ? ?Morbid obesity ?-Outpatient follow-up ? ? ?DVT prophylaxis: Subcutaneous heparin  ?code Status: Full ?Family Communication: None at bedside ?Disposition Plan: ?Status is: Inpatient ?Remains inpatient appropriate because: Of severity of illness, patient to make decision about surgical options. ? ? ?Consultants: Vascular surgery/podiatry/nephrology ? ?Procedures: Left lower extremity arteriogram on 08/07/2021 ?Antimicrobials: None ? ? ?Subjective: ? ?Seen and examined in the morning before she went to the OR.  She was slightly nervous.  No complaints ? ?Objective: ?Vitals:  ? 08/14/21 1040 08/14/21 1055 08/14/21 1110 08/14/21 1125  ?BP: (!) 144/60 134/79 (!) 139/39 (!) 125/47  ?Pulse: 81 83 81 80  ?Resp: 17 18 15 14   ?Temp: 98.1 ?F (36.7 ?C)  98.1 ?F (36.7 ?C)   ?TempSrc:      ?SpO2: 100% 100% 100% 100%  ?Weight:      ?Height:      ? ? ?Intake/Output Summary (Last 24 hours) at 08/14/2021 1143 ?Last data filed at 08/14/2021 1110 ?Gross per 24 hour  ?Intake 1340 ml  ?Output 300 ml  ?Net 1040 ml  ? ? ?Filed Weights  ? 08/13/21  3825 08/13/21 1121 08/14/21 0659  ?Weight: (!) 161.2 kg (!) 159.4 kg (!) 159.4 kg  ? ? ?Examination: ? ?General exam: Appears calm and comfortable, morbidly obese ?Respiratory system: Clear to auscultation. Respiratory effort normal. ?Cardiovascular system: S1  & S2 heard, RRR. No JVD, murmurs, rubs, gallops or clicks. No pedal edema. ?Gastrointestinal system: Abdomen is nondistended, soft and nontender. No organomegaly or masses felt. Normal bowel sounds heard. ?Central nervous system: Alert and oriented. No focal neurological deficits. ?Extremities: Symmetric 5 x 5 power. ?Skin: Dry gangrene right second third and fourth toe. ?Psychiatry: Judgement and insight appear normal. Mood & affect appropriate.  ? ?Data Reviewed: I have personally reviewed following labs and imaging studies ? ?CBC: ?Recent Labs  ?Lab 08/08/21 ?0539 08/10/21 ?0258 08/12/21 ?7673 08/13/21 ?4193 08/14/21 ?7902  ?WBC 7.0 9.4 13.2* 14.1*  --   ?NEUTROABS  --  7.0 9.1*  --   --   ?HGB 7.8* 7.6* 8.5* 8.5* 9.9*  ?HCT 26.4* 26.1* 28.2* 29.4* 29.0*  ?MCV 98.5 97.8 95.3 96.1  --   ?PLT 211 252 299 PLATELET CLUMPS NOTED ON SMEAR, UNABLE TO ESTIMATE  --   ? ? ?Basic Metabolic Panel: ?Recent Labs  ?Lab 08/08/21 ?4097 08/09/21 ?3532 08/10/21 ?9924 08/12/21 ?2683 08/13/21 ?4196 08/14/21 ?2229  ?NA 134* 135 135 131* 132* 134*  ?K 4.4 4.2 4.2 4.4 3.9 3.6  ?CL 97* 99 97* 94* 96* 96*  ?CO2 22 22 23 22  21*  --   ?GLUCOSE 82 96 91 119* 129* 127*  ?BUN 32* 21* 27* 26* 33* 26*  ?CREATININE 11.66* 9.25* 11.58* 9.77* 12.02* 10.40*  ?CALCIUM 7.7* 7.7* 7.8* 7.5* 7.5*  --   ?PHOS 5.2*  --  5.1* 4.3 5.2*  --   ? ? ?GFR: ?Estimated Creatinine Clearance: 9.9 mL/min (A) (by C-G formula based on SCr of 10.4 mg/dL (H)). ?Liver Function Tests: ?Recent Labs  ?Lab 08/08/21 ?0620 08/12/21 ?0238 08/13/21 ?7989  ?ALBUMIN 2.9* 2.8* 2.7*  ? ? ?No results for input(s): LIPASE, AMYLASE in the last 168 hours. ?No results for input(s): AMMONIA in the last 168 hours. ?Coagulation Profile: ?No results for input(s): INR, PROTIME in the last 168 hours. ?Cardiac Enzymes: ?No results for input(s): CKTOTAL, CKMB, CKMBINDEX, TROPONINI in the last 168 hours. ? ?BNP (last 3 results) ?No results for input(s): PROBNP in the last 8760 hours. ?HbA1C: ?No  results for input(s): HGBA1C in the last 72 hours. ?CBG: ?Recent Labs  ?Lab 08/13/21 ?0600 08/13/21 ?1610 08/13/21 ?2104 08/14/21 ?2119 08/14/21 ?1039  ?GLUCAP 129* 140* 171* 145* 111*  ? ? ?Lipid Profile: ?No results for input(s): CHOL, HDL, LDLCALC, TRIG, CHOLHDL, LDLDIRECT in the last 72 hours. ? ?Thyroid Function Tests: ?No results for input(s): TSH, T4TOTAL, FREET4, T3FREE, THYROIDAB in the last 72 hours. ?Anemia Panel: ?No results for input(s): VITAMINB12, FOLATE, FERRITIN, TIBC, IRON, RETICCTPCT in the last 72 hours. ?Sepsis Labs: ?No results for input(s): PROCALCITON, LATICACIDVEN in the last 168 hours. ? ?No results found for this or any previous visit (from the past 240 hour(s)).  ? ? ? ? ? ?Radiology Studies: ?No results found. ? ? ? ? ? ?Scheduled Meds: ? [MAR Hold] aspirin EC  81 mg Oral Q breakfast  ? [MAR Hold] atorvastatin  10 mg Oral QHS  ? [MAR Hold] calcitRIOL  1 mcg Oral Q T,Th,Sa-HD  ? [MAR Hold] calcium carbonate  1,500 mg Oral TID with meals  ? [MAR Hold] calcium carbonate  1.5 tablet Oral With snacks  ? [facility] Hold]  carvedilol  6.25 mg Oral BID WC  ? [MAR Hold] cinacalcet  30 mg Oral Q T,Th,Sa-HD  ? [MAR Hold] darbepoetin (ARANESP) injection - DIALYSIS  200 mcg Intravenous Q Sat-HD  ? [MAR Hold] docusate sodium  100 mg Oral BID  ? [MAR Hold] feeding supplement (NEPRO CARB STEADY)  237 mL Oral TID WC  ? [MAR Hold] gabapentin  100 mg Oral BID  ? [MAR Hold] heparin injection (subcutaneous)  5,000 Units Subcutaneous Q8H  ? [MAR Hold] insulin aspart  0-6 Units Subcutaneous TID WC  ? [MAR Hold] lidocaine  1 patch Transdermal Q24H  ? [MAR Hold] lisinopril  20 mg Oral Daily  ? [MAR Hold] mupirocin ointment  1 application. Topical BID  ? [MAR Hold] neomycin-bacitracin-polymyxin   Topical BID  ? [MAR Hold] nystatin   Topical TID  ? [MAR Hold] pantoprazole  40 mg Oral Daily  ? [MAR Hold] polyethylene glycol  17 g Oral Daily  ? [MAR Hold] sodium chloride flush  10-40 mL Intracatheter Q12H  ? ?Continuous  Infusions: ? [MAR Hold]  ceFAZolin (ANCEF) IV 2 g (08/13/21 1258)  ? ? ?Darliss Cheney, MD ?[facility] ?08/14/2021, 11:43 AM  ? ?

## 2021-08-14 NOTE — TOC Progression Note (Signed)
Transition of Care (TOC) - Progression Note  ? ? ?Patient Details  ?Name: Courtney Gray ?MRN: [id number] ?Date of Birth: March 25, 1971 ? ?Transition of Care (TOC) CM/SW Contact  ?Vinie Sill, LCSW ?Phone Number: ?08/14/2021, 2:14 PM ? ?Clinical Narrative:    ? ?CSW spoke with patient's daughter- CSW advised [facility] has confirmed bed offer. Patient need insurance approval prior to discharge to SNF, CSW discussed insurance coverage and supplemental insurance coverage while at [facility]. CSW encourage family to talk with SNF regarding any future co-pays they may be required.  ? ?Thurmond Butts, MSW, LCSW ?Clinical Social Worker ? ? ? ?Expected Discharge Plan: [place] ?Barriers to Discharge: Continued Medical Work up, Ship broker ? ?Expected Discharge Plan and Services ?Expected Discharge Plan: [place] ?In-house Referral: Clinical Social Work ?  ?  ?  ?                ?  ?  ?  ?  ?  ?  ?  ?  ?  ?  ? ? ?Social Determinants of Health (SDOH) Interventions ?  ? ?Readmission Risk Interventions ? ?  07/12/2021  ? 12:38 PM 05/23/2021  ?  1:12 PM  ?Readmission Risk Prevention Plan  ?Transportation Screening Complete Complete  ?Medication Review Press photographer) Complete Complete  ?PCP or Specialist appointment within 3-5 days of discharge Complete Complete  ?[place] or Home Care Consult Complete Complete  ?SW Recovery Care/Counseling Consult Complete Complete  ?Palliative Care Screening Not Applicable Not Applicable  ?Skilled Nursing Facility Complete Not Applicable  ? ? ?

## 2021-08-14 NOTE — Anesthesia Postprocedure Evaluation (Signed)
Anesthesia Post Note ? ?Patient: Courtney Gray ? ?Procedure(s) Performed: AMPUTATION ABOVE KNEE (Right: Knee) ? ?  ? ?Patient location during evaluation: PACU ?Anesthesia Type: General ?Level of consciousness: awake ?Pain management: pain level controlled ?Vital Signs Assessment: post-procedure vital signs reviewed and stable ?Respiratory status: spontaneous breathing ?Cardiovascular status: stable ?Postop Assessment: no apparent nausea or vomiting ?Anesthetic complications: no ? ? ?No notable events documented. ? ?Last Vitals:  ?Vitals:  ? 08/14/21 1150 08/14/21 1151  ?BP: (!) 132/110 132/68  ?Pulse: 84   ?Resp: 17   ?Temp: 36.9 ?C   ?SpO2: 98%   ?  ?Last Pain:  ?Vitals:  ? 08/14/21 1217  ?TempSrc:   ?PainSc: 6   ? ? ?  ?  ?  ?  ?  ?  ? ?Zaela Graley ? ? ? ? ?

## 2021-08-14 NOTE — Progress Notes (Signed)
[facility] ? ?Subjective  - states her right toes have been hurting all night. ? ? ?Objective ?(!) 147/82 ?87 ?98.5 ?F (36.9 ?C) (Oral) ?(!) 22 ?100% ? ?Intake/Output Summary (Last 24 hours) at 08/14/2021 0813 ?Last data filed at 08/13/2021 1121 ?Gross per 24 hour  ?Intake --  ?Output 3338 ml  ?Net -3338 ml  ? ? ? ? ? ?Laboratory ?Lab Results: ?Recent Labs  ?  08/12/21 ?0238 08/13/21 ?2353 08/14/21 ?6144  ?WBC 13.2* 14.1*  --   ?HGB 8.5* 8.5* 9.9*  ?HCT 28.2* 29.4* 29.0*  ?PLT 299 PLATELET CLUMPS NOTED ON SMEAR, UNABLE TO ESTIMATE  --   ? ?BMET ?Recent Labs  ?  08/12/21 ?0238 08/13/21 ?0619 08/14/21 ?0807  ?NA 131* 132* 134*  ?K 4.4 3.9 3.6  ?CL 94* 96* 96*  ?CO2 22 21*  --   ?GLUCOSE 119* 129* 127*  ?BUN 26* 33* 26*  ?CREATININE 9.77* 12.02* 10.40*  ?CALCIUM 7.5* 7.5*  --   ? ? ?COAG ?Lab Results  ?Component Value Date  ? INR 1.3 (H) 07/10/2021  ? ?No results found for: PTT ? ?Assessment/Planning: ? ?51 year old female with end-stage renal disease and diabetes presents with critical limb ischemia with tissue loss in the right lower extremity.  She underwent angiogram that shows inline flow down to the ankle through the AT and no flow into the foot with significant small vessel disease.  She was seen by podiatry and offered a TMA that would be high risk and instead has opted for more proximal amputation.  I have offered her a BKA vs AKA and she would prefer an AKA as she does not want multiple operations and wants one operation with the best chance of healing.   ? ?Marty Heck ?08/14/2021 ?8:13 AM ?-- ? ? ?

## 2021-08-14 NOTE — Op Note (Signed)
? ? ?  OPERATIVE NOTE ? ? ?PROCEDURE: right above-the-knee amputation ? ?PRE-OPERATIVE DIAGNOSIS: right foot gangrene ? ?POST-OPERATIVE DIAGNOSIS: same as above ? ?SURGEON: Marty Heck, MD ? ?ASSISTANT(S): Arlee Muslim, PA ? ?ANESTHESIA: general ? ?ESTIMATED BLOOD LOSS: <200 mL ? ?FINDING(S): ?Right above-knee amputation performed with healthy tissue margins. ? ?SPECIMEN(S):  right above-the-knee amputation ? ?INDICATIONS:   ?Courtney Gray is a 51 y.o. female who presents with right foot gangrene.  The patient is scheduled for a right above-the-knee amputation.  I discussed in depth with the patient the risks, benefits, and alternatives to this procedure.  The patient is aware that the risk of this operation included but are not limited to:  bleeding, infection, myocardial infarction, stroke, death, failure to heal amputation wound, and possible need for more proximal amputation.  The patient is aware of the risks and agrees proceed forward with the procedure.  An assistant was needed to elevate the leg and help complete the amputation and for stump closure. ? ?DESCRIPTION: ?After full informed written consent was obtained from the patient, the patient was brought back to the operating room, and placed supine upon the operating table.  Prior to induction, the patient received IV antibiotics.  The patient was then prepped and draped in the standard fashion for an above-the-knee amputation.  I marked out the anterior and posterior flaps for a fish-mouth type of amputation. I made the incisions for these flaps, and then dissected through the subcutaneous tissue, fascia, and muscles circumferentially.  I elevated  the periosteal tissue more proximal than the anterior skin flap.  I then transected the femur with a power saw at this level.  The femoral vessels were clamped with a kelly clamp.  The posterior flap was completed.  The femoral vessels were ligated with 2-0 silk suture and 2-0 Vicryl suture.  At this  point, the specimen was passed off the field as the above-the-knee amputation.  At this point, I clamped all visibly bleeding arteries and veins using a combination of suture ligation with silk suture.  The stump was washed off with sterile normal saline and no further active bleeding was noted.  I reapproximated the anterior and posterior fascia  with interrupted stitches of 2-0 Vicryl.  This was completed along the entire length of anterior and posterior fascia until there were no more loose space in the fascial line.  The skin was then  reapproximated with staples.  The stump was washed off and dried.  The incision was dressed with [employer] and  then fluffs were applied.  Kerlix was wrapped around the leg and then gently an ACE wrap was applied.   ? ?COMPLICATIONS: None ? ?CONDITION: Stable ? ?Marty Heck, MD ?[facility] ?Office: [phone] ? ?Marty Heck ? ? ?08/14/2021, 10:16 AM ? ?

## 2021-08-14 NOTE — Progress Notes (Signed)
?[facility] ?Progress Note  ? ?Subjective:    ?AKA done this AM.  She is back in room doing fine.   ? ?Objective ?Vitals:  ? 08/14/21 1125 08/14/21 1140 08/14/21 1150 08/14/21 1151  ?BP: (!) 125/47 133/69 (!) 132/110 132/68  ?Pulse: 80 81 84   ?Resp: 14 16 17    ?Temp:   98.4 ?F (36.9 ?C)   ?TempSrc:   Oral   ?SpO2: 100% 100% 98%   ?Weight:      ?Height:      ? ?Physical Exam ?General: Obese woman, on nasal O2, nad  ?Heart: Distant No m,r,g  ?Lungs: Clear bilaterally  ?Abdomen: soft, non -tender ?Extremities: LLE no edema, R AKA noted ?Dialysis Access: R IJ TDC c/d/i ?  ?Filed Weights  ? 08/13/21 0703 08/13/21 1121 08/14/21 0659  ?Weight: (!) 161.2 kg (!) 159.4 kg (!) 159.4 kg  ? ? ?Intake/Output Summary (Last 24 hours) at 08/14/2021 1421 ?Last data filed at 08/14/2021 1110 ?Gross per 24 hour  ?Intake 1340 ml  ?Output 300 ml  ?Net 1040 ml  ? ? ? ?Additional Objective ?Labs: ?Basic Metabolic Panel: ?Recent Labs  ?Lab 08/10/21 ?0258 08/12/21 ?3007 08/13/21 ?6226 08/14/21 ?3335  ?NA 135 131* 132* 134*  ?K 4.2 4.4 3.9 3.6  ?CL 97* 94* 96* 96*  ?CO2 23 22 21*  --   ?GLUCOSE 91 119* 129* 127*  ?BUN 27* 26* 33* 26*  ?CREATININE 11.58* 9.77* 12.02* 10.40*  ?CALCIUM 7.8* 7.5* 7.5*  --   ?PHOS 5.1* 4.3 5.2*  --   ? ? ?Liver Function Tests: ?Recent Labs  ?Lab 08/08/21 ?0620 08/12/21 ?0238 08/13/21 ?4562  ?ALBUMIN 2.9* 2.8* 2.7*  ? ? ?No results for input(s): LIPASE, AMYLASE in the last 168 hours. ?CBC: ?Recent Labs  ?Lab 08/08/21 ?5638 08/10/21 ?0258 08/12/21 ?9373 08/13/21 ?4287 08/14/21 ?6811  ?WBC 7.0 9.4 13.2* 14.1*  --   ?NEUTROABS  --  7.0 9.1*  --   --   ?HGB 7.8* 7.6* 8.5* 8.5* 9.9*  ?HCT 26.4* 26.1* 28.2* 29.4* 29.0*  ?MCV 98.5 97.8 95.3 96.1  --   ?PLT 211 252 299 PLATELET CLUMPS NOTED ON SMEAR, UNABLE TO ESTIMATE  --   ? ? ?Blood Culture ?   ?Component Value Date/Time  ? SDES BLOOD LEFT HAND 09/03/2020 2359  ? SPECREQUEST  09/03/2020 2359  ?  BOTTLES DRAWN AEROBIC AND ANAEROBIC Blood Culture adequate  volume  ? CULT  09/03/2020 2359  ?  NO GROWTH 5 DAYS ?Performed at [facility], [address], [place], [place] [address] ?  ? REPTSTATUS 09/09/2020 FINAL 09/03/2020 2359  ? ? ?Cardiac Enzymes: ?No results for input(s): CKTOTAL, CKMB, CKMBINDEX, TROPONINI in the last 168 hours. ?CBG: ?Recent Labs  ?Lab 08/13/21 ?1610 08/13/21 ?2104 08/14/21 ?0355 08/14/21 ?1039 08/14/21 ?1238  ?GLUCAP 140* 171* 145* 111* 154*  ? ? ?Iron Studies: No results for input(s): IRON, TIBC, TRANSFERRIN, FERRITIN in the last 72 hours. ?Lab Results  ?Component Value Date  ? INR 1.3 (H) 07/10/2021  ? ?Studies/Results: ?No results found. ? ?Medications: ? sodium chloride    ? [START ON 08/15/2021]  ceFAZolin (ANCEF) IV    ? [START ON 08/17/2021]  ceFAZolin (ANCEF) IV    ? magnesium sulfate bolus IVPB    ? ? aspirin EC  81 mg Oral Q breakfast  ? atorvastatin  10 mg Oral QHS  ? calcitRIOL  1 mcg Oral Q T,Th,Sa-HD  ? calcium carbonate  1,500 mg Oral TID with meals  ?  calcium carbonate  1.5 tablet Oral With snacks  ? carvedilol  6.25 mg Oral BID WC  ? cinacalcet  30 mg Oral Q T,Th,Sa-HD  ? [START ON 08/17/2021] darbepoetin (ARANESP) injection - DIALYSIS  200 mcg Intravenous Q Sat-HD  ? docusate sodium  100 mg Oral BID  ? feeding supplement (NEPRO CARB STEADY)  237 mL Oral TID WC  ? gabapentin  100 mg Oral BID  ? [START ON 08/15/2021] heparin injection (subcutaneous)  5,000 Units Subcutaneous Q8H  ? insulin aspart  0-6 Units Subcutaneous TID WC  ? lidocaine  1 patch Transdermal Q24H  ? lisinopril  20 mg Oral Daily  ? mupirocin ointment  1 application. Topical BID  ? neomycin-bacitracin-polymyxin   Topical BID  ? nystatin   Topical TID  ? pantoprazole  40 mg Oral Daily  ? polyethylene glycol  17 g Oral Daily  ? sodium chloride flush  10-40 mL Intracatheter Q12H  ? sodium chloride flush  3 mL Intravenous Q12H  ? ? ?Dialysis Orders: ?[name] ? 4h 76min  165kg  1K/2.5Ca bath  TDC  400 BFR ? - 4/29 > HBsAg neg w/ Abs < 10 ? - mircera 200ug q2 ? -  calcitriol 1 ug tiw ? - sensipar 30 mg tiw ? ?Assessment/Plan: ?Bilateral foot pain - w/ ischemic changes R foot+ gangrenous toes x VVS consulted. S/p Bilat LE angiogram 5/3 per Dr. Virl Cagey note - severe calcific disease bilaterally.  Underwent AKA on 5/10 ?ESRD - on HD TTS. Next tomorrow.  Will need new EDW on DC.  ?Volume - Has been tolerating UF 3.5L.  Will likely need less tomorrow as periop po intake less.  ?HTN - home meds per pmd as needed.  ?Anemia ckd - Hgb 8.5. Redose ESA with HD Sat. 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 Cont sensipar and vdra w/ HD tiw. On tums as binder. ?Chronic hypoxic respiratory failure - on home oxygen  ?Nutrition-Alb 2.8. Ordered Nepro supplement but gives diarrhea so not drinking.  Daughter can bring protein bar. ?  ?Jannifer Hick MD ?[facility] ?Pager [phone] ? ?  ?

## 2021-08-14 NOTE — Transfer of Care (Signed)
Immediate Anesthesia Transfer of Care Note ? ?Patient: Courtney Gray ? ?Procedure(s) Performed: AMPUTATION ABOVE KNEE (Right: Knee) ? ?Patient Location: PACU ? ?Anesthesia Type:General ? ?Level of Consciousness: drowsy and patient cooperative ? ?Airway & Oxygen Therapy: Patient Spontanous Breathing and Patient connected to face mask oxygen ? ?Post-op Assessment: Report given to RN and Post -op Vital signs reviewed and stable ? ?Post vital signs: Reviewed and stable ? ?Last Vitals:  ?Vitals Value Taken Time  ?BP 144/60 08/14/21 1040  ?Temp    ?Pulse 81 08/14/21 1041  ?Resp 15 08/14/21 1041  ?SpO2 100 % 08/14/21 1041  ?Vitals shown include unvalidated device data. ? ?Last Pain:  ?Vitals:  ? 08/14/21 0630  ?TempSrc:   ?PainSc: Asleep  ?   ? ?Patients Stated Pain Goal: 3 (08/13/21 0541) ? ?Complications: No notable events documented. ?

## 2021-08-15 ENCOUNTER — Encounter (HOSPITAL_COMMUNITY): Payer: Self-pay | Admitting: Vascular Surgery

## 2021-08-15 DIAGNOSIS — I998 Other disorder of circulatory system: Secondary | ICD-10-CM | POA: Diagnosis not present

## 2021-08-15 DIAGNOSIS — M79673 Pain in unspecified foot: Secondary | ICD-10-CM | POA: Diagnosis not present

## 2021-08-15 LAB — BASIC METABOLIC PANEL
Anion gap: 13 (ref 5–15)
BUN: 36 mg/dL — ABNORMAL HIGH (ref 6–20)
CO2: 22 mmol/L (ref 22–32)
Calcium: 7.2 mg/dL — ABNORMAL LOW (ref 8.9–10.3)
Chloride: 98 mmol/L (ref 98–111)
Creatinine, Ser: 11.39 mg/dL — ABNORMAL HIGH (ref 0.44–1.00)
GFR, Estimated: 4 mL/min — ABNORMAL LOW (ref >60)
Glucose, Bld: 228 mg/dL — ABNORMAL HIGH (ref 70–99)
Potassium: 5 mmol/L (ref 3.5–5.1)
Sodium: 133 mmol/L — ABNORMAL LOW (ref 135–145)

## 2021-08-15 LAB — GLUCOSE, CAPILLARY
Glucose-Capillary: 234 mg/dL — ABNORMAL HIGH (ref 70–99)
Glucose-Capillary: 222 mg/dL — ABNORMAL HIGH (ref 70–99)
Glucose-Capillary: 150 mg/dL — ABNORMAL HIGH (ref 70–99)
Glucose-Capillary: 242 mg/dL — ABNORMAL HIGH (ref 70–99)

## 2021-08-15 LAB — CBC
HCT: 26.4 % — ABNORMAL LOW (ref 36.0–46.0)
Hemoglobin: 7.8 g/dL — ABNORMAL LOW (ref 12.0–15.0)
MCH: 28.6 pg (ref 26.0–34.0)
MCHC: 29.5 g/dL — ABNORMAL LOW (ref 30.0–36.0)
MCV: 96.7 fL (ref 80.0–100.0)
Platelets: 358 10*3/uL (ref 150–400)
RBC: 2.73 MIL/uL — ABNORMAL LOW (ref 3.87–5.11)
RDW: 18.3 % — ABNORMAL HIGH (ref 11.5–15.5)
WBC: 20.4 10*3/uL — ABNORMAL HIGH (ref 4.0–10.5)
nRBC: 0 % (ref 0.0–0.2)

## 2021-08-15 MED ORDER — HEPARIN SODIUM (PORCINE) 1000 UNIT/ML IJ SOLN
INTRAMUSCULAR | Status: AC
Start: 2021-08-15 — End: 2021-08-15
  Filled 2021-08-15: qty 4

## 2021-08-15 MED ORDER — INSULIN GLARGINE-YFGN 100 UNIT/ML ~~LOC~~ SOLN
10.0000 [IU] | Freq: Every day | SUBCUTANEOUS | Status: DC
Start: 2021-08-15 — End: 2021-08-28
  Administered 2021-08-16 – 2021-08-27 (×12): 10 [IU] via SUBCUTANEOUS
  Filled 2021-08-15 (×14): qty 0.1

## 2021-08-15 MED ORDER — HYDROMORPHONE HCL 1 MG/ML IJ SOLN
1.0000 mg | Freq: Once | INTRAMUSCULAR | Status: AC
  Administered 2021-08-15: 1 mg via INTRAVENOUS
  Filled 2021-08-15: qty 1

## 2021-08-15 NOTE — Progress Notes (Signed)
PT Cancellation Note ? ?Patient Details ?Name: Courtney Gray ?MRN: [id number] ?DOB: July 24, 1970 ? ? ?Cancelled Treatment:    Reason Eval/Treat Not Completed: (P) Patient at procedure or test/unavailable Pt is off floor for HD. PT will follow back after dialysis for Evaluation. ? ?Geoffrey Mankin B. Migdalia Dk PT, DPT ?Acute Rehabilitation Services ?Please use secure chat or  ?Call Office [phone] ? ? ? ?[place] ?08/15/2021, 8:36 AM ? ? ?

## 2021-08-15 NOTE — Progress Notes (Addendum)
?PROGRESS NOTE ? ? ? ?Courtney Gray  WSF:[id number] DOB: 1970/08/19 DOA: 08/03/2021 ?PCP: [facility], [facility]  ? ?Brief Narrative:  ?51 y.o. female with medical history significant of end-stage renal disease on hemodialysis, gout, diabetes mellitus type II, hypertension who just discharged from rehab this past Monday and was told that she had run out of her days and that she needed to pay out-of-pocket for any further rehab.  She presented again with worsening right foot pain and there was a concern for ischemic foot.  She was admitted and transferred to [facility].  CT angiogram was inconclusive.  She underwent arteriogram on 08/07/2021.  Podiatry following. ? ?Assessment & Plan: ?  ?Ischemic right foot ?-Evidence of gangrenous changes in some of her toes of the right foot. ?-CT angiogram was inconclusive due to significant calcification. ?-Vascular surgery following: Status post arteriogram on 08/07/2021 which showed severe calcific disease in bilateral lower extremities with no endovascular or open surgical options to improve perfusion.  IV heparin subsequently discontinued by vascular surgery. ?-Podiatry also following.  Recommended to start IV Ancef.  Podiatry recommended transmet or Chopart level.  Vascular is a following.  Patient instead made a decision to proceed with right AKA since she does not want to have multiple surgery and wants to have the surgery with the best chances and vascular had recommended her the best chance would be with AKA.  She underwent AKA on 08/14/2021.  Complains of pain but appears very comfortable.  Continue current pain management.  She will likely require discharge to SNF.  CIR has rejected her.  Appreciate vascular surgery assistance. ? ?End-stage renal disease on hemodialysis ?-Nephrology following.  Dialysis as per nephrology schedule ? ?Chronic respiratory failure with hypoxia ?-Chronically on 3 L oxygen via nasal cannula.  Respiratory status currently  stable ? ?Diabetes mellitus type 2 with hypoglycemia ?-A1c 5.7. ?-Still has intermittent episodes of hypoglycemia.  Continue CBGs. ?-Glipizide and Lantus were discontinued during last hospitalization because of episodes of hypoglycemia.  Blood sugar elevated.  Will start on Semglee 10 units and continue SSI. ? ?Essential hypertension ?Blood pressure now better.  On Coreg.  Amlodipine on hold.  We will watch another day. ? ?Acute on chronic anemia of chronic kidney disease: Hemoglobin dropped from 9.9 yesterday to 7.8 today, likely postop anemia.  Monitor closely and transfuse if less than 7. ? ?Hyperlipidemia ?-continue statin ? ?Hypersomnolence ?-Likely due to combination of illness and medications.  Gabapentin dose has been decreased.  Judicious use of narcotics.  Feeling much better and fully awake and alert Since 08/10/2021. ? ?History of hemorrhoids ?-Stable ? ?Questionable history of gout ?-apparently treated for gout of her right foot recently ? ?Morbid obesity ?-Outpatient follow-up ? ? ?DVT prophylaxis: Subcutaneous heparin  ?code Status: Full ?Family Communication: None at bedside ?Disposition Plan: ?Status is: Inpatient ?Remains inpatient appropriate because: Of severity of illness, patient to make decision about surgical options. ? ? ?Consultants: Vascular surgery/podiatry/nephrology ? ?Procedures: Left lower extremity arteriogram on 08/07/2021. ?Right AKA ?Antimicrobials: None ? ? ?Subjective: ? ?Seen and examined in dialysis unit.  She complains of pain in the right lower extremity.  No other complaint. ? ?Objective: ?Vitals:  ? 08/15/21 1100 08/15/21 1130 08/15/21 1138 08/15/21 1148  ?BP: (!) 152/71 (!) 143/77 98/79 (!) 158/82  ?Pulse: 89 94 95 95  ?Resp: 14 20 15 19   ?Temp:      ?TempSrc:      ?SpO2: 100% 100% 100% 100%  ?Weight:    (!) 152.8  kg  ?Height:      ? ? ?Intake/Output Summary (Last 24 hours) at 08/15/2021 1235 ?Last data filed at 08/15/2021 1148 ?Gross per 24 hour  ?Intake 250 ml  ?Output 2997  ml  ?Net -2747 ml  ? ? ?Filed Weights  ? 08/15/21 0500 08/15/21 0722 08/15/21 1148  ?Weight: (!) 155.9 kg (!) 155.9 kg (!) 152.8 kg  ? ? ?Examination: ? ?General exam: Appears calm and comfortable, morbidly obese ?Respiratory system: Clear to auscultation. Respiratory effort normal. ?Cardiovascular system: S1 & S2 heard, RRR. No JVD, murmurs, rubs, gallops or clicks. No pedal edema. ?Gastrointestinal system: Abdomen is nondistended, soft and nontender. No organomegaly or masses felt. Normal bowel sounds heard. ?Central nervous system: Alert and oriented. No focal neurological deficits. ?Extremities: Right lower extremity AKA ?Skin: No rashes, lesions or ulcers.  ?Psychiatry: Judgement and insight appear normal. Mood & affect appropriate.  ? ?Data Reviewed: I have personally reviewed following labs and imaging studies ? ?CBC: ?Recent Labs  ?Lab 08/10/21 ?0258 08/12/21 ?4315 08/13/21 ?4008 08/14/21 ?6761 08/15/21 ?9509  ?WBC 9.4 13.2* 14.1*  --  20.4*  ?NEUTROABS 7.0 9.1*  --   --   --   ?HGB 7.6* 8.5* 8.5* 9.9* 7.8*  ?HCT 26.1* 28.2* 29.4* 29.0* 26.4*  ?MCV 97.8 95.3 96.1  --  96.7  ?PLT 252 299 PLATELET CLUMPS NOTED ON SMEAR, UNABLE TO ESTIMATE  --  358  ? ? ?Basic Metabolic Panel: ?Recent Labs  ?Lab 08/09/21 ?0242 08/10/21 ?0258 08/12/21 ?3267 08/13/21 ?1245 08/14/21 ?8099 08/15/21 ?8338  ?NA 135 135 131* 132* 134* 133*  ?K 4.2 4.2 4.4 3.9 3.6 5.0  ?CL 99 97* 94* 96* 96* 98  ?CO2 22 23 22  21*  --  22  ?GLUCOSE 96 91 119* 129* 127* 228*  ?BUN 21* 27* 26* 33* 26* 36*  ?CREATININE 9.25* 11.58* 9.77* 12.02* 10.40* 11.39*  ?CALCIUM 7.7* 7.8* 7.5* 7.5*  --  7.2*  ?PHOS  --  5.1* 4.3 5.2*  --   --   ? ? ?GFR: ?Estimated Creatinine Clearance: 8.8 mL/min (A) (by C-G formula based on SCr of 11.39 mg/dL (H)). ?Liver Function Tests: ?Recent Labs  ?Lab 08/12/21 ?0238 08/13/21 ?2505  ?ALBUMIN 2.8* 2.7*  ? ? ?No results for input(s): LIPASE, AMYLASE in the last 168 hours. ?No results for input(s): AMMONIA in the last 168  hours. ?Coagulation Profile: ?No results for input(s): INR, PROTIME in the last 168 hours. ?Cardiac Enzymes: ?No results for input(s): CKTOTAL, CKMB, CKMBINDEX, TROPONINI in the last 168 hours. ? ?BNP (last 3 results) ?No results for input(s): PROBNP in the last 8760 hours. ?HbA1C: ?No results for input(s): HGBA1C in the last 72 hours. ?CBG: ?Recent Labs  ?Lab 08/14/21 ?1039 08/14/21 ?1238 08/14/21 ?1636 08/14/21 ?2146 08/15/21 ?3976  ?GLUCAP 111* 154* 262* 315* 234*  ? ? ?Lipid Profile: ?No results for input(s): CHOL, HDL, LDLCALC, TRIG, CHOLHDL, LDLDIRECT in the last 72 hours. ? ?Thyroid Function Tests: ?No results for input(s): TSH, T4TOTAL, FREET4, T3FREE, THYROIDAB in the last 72 hours. ?Anemia Panel: ?No results for input(s): VITAMINB12, FOLATE, FERRITIN, TIBC, IRON, RETICCTPCT in the last 72 hours. ?Sepsis Labs: ?No results for input(s): PROCALCITON, LATICACIDVEN in the last 168 hours. ? ?No results found for this or any previous visit (from the past 240 hour(s)).  ? ? ? ? ? ?Radiology Studies: ?No results found. ? ? ? ? ? ?Scheduled Meds: ? aspirin EC  81 mg Oral Q breakfast  ? atorvastatin  10 mg Oral QHS  ?  calcitRIOL  1 mcg Oral Q T,Th,Sa-HD  ? calcium carbonate  1,500 mg Oral TID with meals  ? calcium carbonate  1.5 tablet Oral With snacks  ? carvedilol  6.25 mg Oral BID WC  ? cinacalcet  30 mg Oral Q T,Th,Sa-HD  ? [START ON 08/17/2021] darbepoetin (ARANESP) injection - DIALYSIS  200 mcg Intravenous Q Sat-HD  ? docusate sodium  100 mg Oral BID  ? feeding supplement (NEPRO CARB STEADY)  237 mL Oral TID WC  ? gabapentin  100 mg Oral BID  ? heparin injection (subcutaneous)  5,000 Units Subcutaneous Q8H  ? heparin sodium (porcine)      ? insulin aspart  0-6 Units Subcutaneous TID WC  ? insulin glargine-yfgn  10 Units Subcutaneous Daily  ? lidocaine  1 patch Transdermal Q24H  ? lisinopril  20 mg Oral Daily  ? mupirocin ointment  1 application. Topical BID  ? neomycin-bacitracin-polymyxin   Topical BID  ?  nystatin   Topical TID  ? pantoprazole  40 mg Oral Daily  ? polyethylene glycol  17 g Oral Daily  ? sodium chloride flush  10-40 mL Intracatheter Q12H  ? sodium chloride flush  3 mL Intravenous Q12H  ? ?Continuous Infusions: ? so

## 2021-08-15 NOTE — Progress Notes (Signed)
Pt received HD alert and appropriate, complaining of phantom pain in left leg.  Pt requesting respositioning but required extensive emotional support before during and after.  RN and NT spent more than 30 mins at bedside, along with daughter in the room.  Will cont plan of care ?

## 2021-08-15 NOTE — Progress Notes (Signed)
Physical Therapy Re-Evaluation and Treatment ?Patient Details ?Name: Courtney Gray ?MRN: [id number] ?DOB: 1970/05/29 ?Today's Date: 08/15/2021 ? ? ?History of Present Illness 51 y.o. female who just discharged from rehab this past Monday and was told that she had run out of her days and that she needed to pay out-of-pocket for any further rehab.  She presented again with worsening right foot pain and there was a concern for ischemic foot. Pt also reports she dropped her O2 canister on her R foot.  She was admitted and transferred to [facility].  CT angiogram was inconclusive.  She underwent arteriogram on 08/07/2021. Podiatry discussing TMA versus chopart level amputation with the patient and vascular MD recommending more proximal amputation. She feels she needs more time to think about her options.  PMH: end-stage renal disease on hemodialysis, gout, diabetes mellitus type II, hypertension ? ?  ?PT Comments  ? ? Pt had been left in Trendelenburg by staff at pt request after being pulled up in bed. Provided maximal education about limb loss and grief, anxiety,deep breathing for improved tolerance to movement, phantom limb pain, exercise. Pt eventually able to utilize bed controls to elevate HoB to 50 degrees, perform light palpation of end of limb, and visual input of end of limb location. Pt then able to provide AAROM in hip flexion, and AB/Adduction. Gave realistic picture of need for staff to assist with rolling for bathing and use of bed pan and that she may require pain medication prior to activity. D/c plans remain appropriate. Goals have been reviewed and downgraded secondary to amputation. PT will continue to follow acutely.  ?      ?Recommendations for follow up therapy are one component of a multi-disciplinary discharge planning process, led by the attending physician.  Recommendations may be updated based on patient status, additional functional criteria and insurance authorization. ? ?Follow Up  Recommendations ? Skilled nursing-short term rehab (<3 hours/day) ?  ?  ?Assistance Recommended at Discharge Frequent or constant Supervision/Assistance  ?Patient can return home with the following Assistance with cooking/housework;Assist for transportation;Help with stairs or ramp for entrance;Two people to help with walking and/or transfers;Two people to help with bathing/dressing/bathroom;Direct supervision/assist for medications management ?  ?Equipment Recommendations ? Hospital bed;Other (comment) (mechanical lift and wheelchair pending progress and surgical plan; all bariatric size)  ?  ?   ?Precautions / Restrictions Precautions ?Precautions: Fall ?Precaution Comments: R AKA ?Restrictions ?Weight Bearing Restrictions: No  ?  ? ?Mobility ? Bed Mobility ?Overal bed mobility: Needs Assistance ?Bed Mobility: Supine to Sit ?  ?  ?Supine to sit: Max assist ?  ?  ?General bed mobility comments: maximal encouragement and use of deep breathing for going from trendelenberg position to HoB elevation with pt use of bed controls ?  ? ?  ?  ?  ?  ?  ?  ?   ?Cognition Arousal/Alertness: Awake/alert ?Behavior During Therapy: Anxious ?Overall Cognitive Status: Within Functional Limits for tasks assessed ?  ?  ?  ?  ?  ?  ?  ?  ?  ?  ?  ?  ?  ?  ?  ?  ?General Comments: pt with extreme anxiety about pain with moving today, maximal time spent on education of grief over loss of limb, anxiety management with deep breathing, phantom limb pain causes and techniques for mitigation and realities of need for assistance with bathing and toileting in bed until she can improve mobility ?  ?  ? ?  ?Exercises  General Exercises - Lower Extremity ?Ankle Circles/Pumps: Left, AROM, 5 reps, Supine ?Gluteal Sets: AROM, Both, 10 reps, Supine ?Heel Slides: AROM, Left, 5 reps, Supine ?Hip ABduction/ADduction: AAROM, Right, 5 reps, Supine ?Straight Leg Raises: AAROM, Right, 5 reps, Supine ?Other Exercises ?Other Exercises: deep breathing  exercises ? ?  ?General Comments General comments (skin integrity, edema, etc.): VSS on 3L O2 via [place], maximal education on neuromuscular re-education, techniques for reducing phantom limb pain, grief for loss of limb, ?  ?  ? ?Pertinent Vitals/Pain Pain Assessment ?Pain Assessment: 0-10 ?Pain Score: 8  ?Pain Location: residual limb, phantom pain ?Pain Descriptors / Indicators: Discomfort ?Pain Intervention(s): Monitored during session, Repositioned, Limited activity within patient's tolerance  ? ? ? ?PT Goals (current goals can now be found in the care plan section) Acute Rehab PT Goals ?Patient Stated Goal: decreased pain, increased independence ?PT Goal Formulation: With patient ?Potential to Achieve Goals: Fair ?Progress towards PT goals: Not progressing toward goals - comment (limited by pain and anxiety) ? ?  ?Frequency ? ? ? Min 2X/week ? ? ? ?  ?PT Plan Current plan remains appropriate  ? ? ?   ?AM-PAC PT "6 Clicks" Mobility   ?Outcome Measure ? Help needed turning from your back to your side while in a flat bed without using bedrails?: Total ?Help needed moving from lying on your back to sitting on the side of a flat bed without using bedrails?: Total ?Help needed moving to and from a bed to a chair (including a wheelchair)?: Total ?Help needed standing up from a chair using your arms (e.g., wheelchair or bedside chair)?: Total ?Help needed to walk in hospital room?: Total ?Help needed climbing 3-5 steps with a railing? : Total ?6 Click Score: 6 ? ?  ?End of Session Equipment Utilized During Treatment: Oxygen ?Activity Tolerance: Patient limited by pain;Other (comment) (limited by anxiety) ?Patient left: with call bell/phone within reach;in chair;with nursing/sitter in room;with family/visitor present ?Nurse Communication: Mobility status;Other (comment) (pt refusing EOB/OOB mobility) ?PT Visit Diagnosis: Other abnormalities of gait and mobility (R26.89);Muscle weakness (generalized) (M62.81);Difficulty in  walking, not elsewhere classified (R26.2);Pain ?Pain - Right/Left: Right ?Pain - part of body:  (residual limb) ?  ? ? ?Time: 4239-5320 ?PT Time Calculation (min) (ACUTE ONLY): 37 min ? ?Charges:  $Therapeutic Exercise: 8-22 mins ?$Neuromuscular Re-education: 8-22 mins          ?          ? ?Ura Yingling B. Migdalia Dk PT, DPT ?Acute Rehabilitation Services ?Please use secure chat or  ?Call Office [phone] ? ? ? ?[place] ?08/15/2021, 5:09 PM ? ?

## 2021-08-15 NOTE — Progress Notes (Addendum)
?  Progress Note ? ? ? ?08/15/2021 ?8:02 AM ?1 Day Post-Op ? ?Subjective: Patient was evaluated on her way up to dialysis.  She is relieved that surgery is behind her. ? ? ?Vitals:  ? 08/15/21 0722 08/15/21 0732  ?BP: (!) 153/84 (!) 161/87  ?Pulse: 85 84  ?Resp: 16 17  ?Temp: 97.6 ?F (36.4 ?C)   ?SpO2: 100% 100%  ? ?Physical Exam: ?Lungs: Nonlabored ?Incisions: Dressing left in place right AKA without breakthrough bleeding ?Neurologic: A&O ? ?CBC ?   ?Component Value Date/Time  ? WBC 20.4 (H) 08/15/2021 0137  ? RBC 2.73 (L) 08/15/2021 0137  ? HGB 7.8 (L) 08/15/2021 0137  ? HCT 26.4 (L) 08/15/2021 0137  ? PLT 358 08/15/2021 0137  ? MCV 96.7 08/15/2021 0137  ? MCH 28.6 08/15/2021 0137  ? MCHC 29.5 (L) 08/15/2021 0137  ? RDW 18.3 (H) 08/15/2021 0137  ? LYMPHSABS 1.3 08/12/2021 0238  ? MONOABS 2.1 (H) 08/12/2021 0238  ? EOSABS 0.4 08/12/2021 0238  ? BASOSABS 0.1 08/12/2021 0238  ? ? ?BMET ?   ?Component Value Date/Time  ? NA 133 (L) 08/15/2021 0137  ? K 5.0 08/15/2021 0137  ? CL 98 08/15/2021 0137  ? CO2 22 08/15/2021 0137  ? GLUCOSE 228 (H) 08/15/2021 0137  ? BUN 36 (H) 08/15/2021 0137  ? CREATININE 11.39 (H) 08/15/2021 0137  ? CALCIUM 7.2 (L) 08/15/2021 0137  ? GFRNONAA 4 (L) 08/15/2021 0137  ? GFRAA 6 (L) 11/29/2019 0120  ? ? ?INR ?   ?Component Value Date/Time  ? INR 1.3 (H) 07/10/2021 1660  ? ? ? ?Intake/Output Summary (Last 24 hours) at 08/15/2021 0802 ?Last data filed at 08/14/2021 1930 ?Gross per 24 hour  ?Intake 1590 ml  ?Output 300 ml  ?Net 1290 ml  ? ? ? ?Assessment/Plan:  51 y.o. female is s/p right AKA 1 Day Post-Op  ? ?Continue current dressing for another 24 hours; dressing change tomorrow ?Pain medicine on schedule today ?PT/OT to evaluate for placement ? ? ?Dagoberto Ligas, PA-C ?[facility] ?[phone] ?08/15/2021 ?8:02 AM ? ?I have seen and evaluated the patient. I agree with the PA note as documented above.  Postop day 1 status post right above-knee amputation.  She looks very  comfortable in dialysis.  Dressing is dry.  We will remove this tomorrow. ? ?Marty Heck, MD ?[facility] ?Office: [phone] ? ? ?

## 2021-08-15 NOTE — Progress Notes (Signed)
?[facility] ?Progress Note  ? ?Subjective:    ?On HD this AM - doing great.  ? ?Objective ?Vitals:  ? 08/15/21 0722 08/15/21 0732 08/15/21 0800 08/15/21 0830  ?BP: (!) 153/84 (!) 161/87 (!) 144/68 (!) 167/79  ?Pulse: 85 84 89 84  ?Resp: 16 17  15   ?Temp: 97.6 ?F (36.4 ?C)     ?TempSrc: Oral     ?SpO2: 100% 100% 100% 100%  ?Weight: (!) 155.9 kg     ?Height:      ? ?Physical Exam ?General: Obese woman, on nasal O2, nad  ?Heart: Distant No m,r,g  ?Lungs: Clear bilaterally  ?Abdomen: soft, non -tender ?Extremities: LLE no edema, R AKA noted ?Dialysis Access: R IJ TDC c/d/i ?  ?Filed Weights  ? 08/14/21 0659 08/15/21 0500 08/15/21 0722  ?Weight: (!) 159.4 kg (!) 155.9 kg (!) 155.9 kg  ? ? ?Intake/Output Summary (Last 24 hours) at 08/15/2021 0906 ?Last data filed at 08/14/2021 1930 ?Gross per 24 hour  ?Intake 1590 ml  ?Output 300 ml  ?Net 1290 ml  ? ? ? ?Additional Objective ?Labs: ?Basic Metabolic Panel: ?Recent Labs  ?Lab 08/10/21 ?0258 08/12/21 ?1610 08/13/21 ?9604 08/14/21 ?5409 08/15/21 ?8119  ?NA 135 131* 132* 134* 133*  ?K 4.2 4.4 3.9 3.6 5.0  ?CL 97* 94* 96* 96* 98  ?CO2 23 22 21*  --  22  ?GLUCOSE 91 119* 129* 127* 228*  ?BUN 27* 26* 33* 26* 36*  ?CREATININE 11.58* 9.77* 12.02* 10.40* 11.39*  ?CALCIUM 7.8* 7.5* 7.5*  --  7.2*  ?PHOS 5.1* 4.3 5.2*  --   --   ? ? ?Liver Function Tests: ?Recent Labs  ?Lab 08/12/21 ?0238 08/13/21 ?1478  ?ALBUMIN 2.8* 2.7*  ? ? ?No results for input(s): LIPASE, AMYLASE in the last 168 hours. ?CBC: ?Recent Labs  ?Lab 08/10/21 ?0258 08/12/21 ?2956 08/13/21 ?2130 08/14/21 ?8657 08/15/21 ?8469  ?WBC 9.4 13.2* 14.1*  --  20.4*  ?NEUTROABS 7.0 9.1*  --   --   --   ?HGB 7.6* 8.5* 8.5* 9.9* 7.8*  ?HCT 26.1* 28.2* 29.4* 29.0* 26.4*  ?MCV 97.8 95.3 96.1  --  96.7  ?PLT 252 299 PLATELET CLUMPS NOTED ON SMEAR, UNABLE TO ESTIMATE  --  358  ? ? ?Blood Culture ?   ?Component Value Date/Time  ? SDES BLOOD LEFT HAND 09/03/2020 2359  ? SPECREQUEST  09/03/2020 2359  ?  BOTTLES DRAWN  AEROBIC AND ANAEROBIC Blood Culture adequate volume  ? CULT  09/03/2020 2359  ?  NO GROWTH 5 DAYS ?Performed at [facility], [address] [place], [place] [address] ?  ? REPTSTATUS 09/09/2020 FINAL 09/03/2020 2359  ? ? ?Cardiac Enzymes: ?No results for input(s): CKTOTAL, CKMB, CKMBINDEX, TROPONINI in the last 168 hours. ?CBG: ?Recent Labs  ?Lab 08/14/21 ?1039 08/14/21 ?1238 08/14/21 ?1636 08/14/21 ?2146 08/15/21 ?8413  ?GLUCAP 111* 154* 262* 315* 234*  ? ? ?Iron Studies: No results for input(s): IRON, TIBC, TRANSFERRIN, FERRITIN in the last 72 hours. ?Lab Results  ?Component Value Date  ? INR 1.3 (H) 07/10/2021  ? ?Studies/Results: ?No results found. ? ?Medications: ? sodium chloride    ?  ceFAZolin (ANCEF) IV    ? [START ON 08/17/2021]  ceFAZolin (ANCEF) IV    ? magnesium sulfate bolus IVPB    ? ? aspirin EC  81 mg Oral Q breakfast  ? atorvastatin  10 mg Oral QHS  ? calcitRIOL  1 mcg Oral Q T,Th,Sa-HD  ? calcium carbonate  1,500  mg Oral TID with meals  ? calcium carbonate  1.5 tablet Oral With snacks  ? carvedilol  6.25 mg Oral BID WC  ? cinacalcet  30 mg Oral Q T,Th,Sa-HD  ? [START ON 08/17/2021] darbepoetin (ARANESP) injection - DIALYSIS  200 mcg Intravenous Q Sat-HD  ? docusate sodium  100 mg Oral BID  ? feeding supplement (NEPRO CARB STEADY)  237 mL Oral TID WC  ? gabapentin  100 mg Oral BID  ? heparin injection (subcutaneous)  5,000 Units Subcutaneous Q8H  ? insulin aspart  0-6 Units Subcutaneous TID WC  ? insulin glargine-yfgn  10 Units Subcutaneous Daily  ? lidocaine  1 patch Transdermal Q24H  ? lisinopril  20 mg Oral Daily  ? mupirocin ointment  1 application. Topical BID  ? neomycin-bacitracin-polymyxin   Topical BID  ? nystatin   Topical TID  ? pantoprazole  40 mg Oral Daily  ? polyethylene glycol  17 g Oral Daily  ? sodium chloride flush  10-40 mL Intracatheter Q12H  ? sodium chloride flush  3 mL Intravenous Q12H  ? ? ?Dialysis Orders: ?[name] ? 4h 76min  165kg  1K/2.5Ca bath  TDC  400 BFR ? -  4/29 > HBsAg neg w/ Abs < 10 ? - mircera 200ug q2 ? - calcitriol 1 ug tiw ? - sensipar 30 mg tiw ? ?Assessment/Plan: ?Bilateral foot pain - w/ ischemic changes R foot+ gangrenous toes x VVS consulted. S/p Bilat LE angiogram 5/3 per Dr. Virl Cagey note - severe calcific disease bilaterally.  Underwent AKA on 5/10 ?ESRD - on HD TTS. HD today. Will need new EDW on DC.  ?Volume - Has been tolerating UF 3.5L.  Will likely need less tomorrow as periop po intake less.  ?HTN - home meds per pmd as needed.  ?Anemia ckd - Hgb 8.5 > 7.8 post op. Redose ESA with HD Sat.  Will check iron indices.  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 Cont sensipar and vdra w/ HD tiw. On tums as binder. ?Chronic hypoxic respiratory failure - on home oxygen  ?Nutrition-Alb 2.8. Ordered Nepro supplement but gives diarrhea so not drinking.  Daughter can bring protein bar. ?  ?Jannifer Hick MD ?[facility] ?Pager [phone] ? ?  ?

## 2021-08-15 NOTE — Progress Notes (Signed)
Attempted to see pt today but pt in HD. Will check back as schedule allows. ? ?Jinger Neighbors, OTR/L ?[phone] ?

## 2021-08-16 DIAGNOSIS — M79673 Pain in unspecified foot: Secondary | ICD-10-CM | POA: Diagnosis not present

## 2021-08-16 DIAGNOSIS — I998 Other disorder of circulatory system: Secondary | ICD-10-CM | POA: Diagnosis not present

## 2021-08-16 LAB — GLUCOSE, CAPILLARY
Glucose-Capillary: 273 mg/dL — ABNORMAL HIGH (ref 70–99)
Glucose-Capillary: 107 mg/dL — ABNORMAL HIGH (ref 70–99)
Glucose-Capillary: 157 mg/dL — ABNORMAL HIGH (ref 70–99)
Glucose-Capillary: 113 mg/dL — ABNORMAL HIGH (ref 70–99)

## 2021-08-16 LAB — SURGICAL PATHOLOGY

## 2021-08-16 MED ORDER — SIMETHICONE 80 MG PO CHEW
80.0000 mg | CHEWABLE_TABLET | Freq: Four times a day (QID) | ORAL | Status: DC | PRN
  Administered 2021-08-17: 80 mg via ORAL
  Filled 2021-08-16: qty 1

## 2021-08-16 MED ORDER — HYDROCODONE-ACETAMINOPHEN 5-325 MG PO TABS
1.0000 | ORAL_TABLET | Freq: Four times a day (QID) | ORAL | Status: DC | PRN
  Administered 2021-08-16 – 2021-08-22 (×16): 2 via ORAL
  Administered 2021-08-23: 1 via ORAL
  Administered 2021-08-23 – 2021-08-26 (×5): 2 via ORAL
  Filled 2021-08-16 (×13): qty 2
  Filled 2021-08-16: qty 1
  Filled 2021-08-16 (×7): qty 2
  Filled 2021-08-16: qty 1
  Filled 2021-08-16 (×4): qty 2

## 2021-08-16 NOTE — TOC Progression Note (Signed)
Transition of Care (TOC) - Progression Note  ? ? ?Patient Details  ?Name: Courtney Gray ?MRN: [id number] ?Date of Birth: 07-31-1970 ? ?Transition of Care (TOC) CM/SW Contact  ?Vinie Sill, LCSW ?Phone Number: ?08/16/2021, 5:13 PM ? ?Clinical Narrative:    ? ?[place] admission 2x- unable to get admissions- to advise of anticipated discharge. ? ?CSW informed by MD, patient is medically stable for discharge. CSW started auth once updated PT/Ot notes were completed.  ? ?Pending auth referral # [id number]- will need authorization before she is discharged. Including, dialysis will need to inform her outpatient clinic if discharged over the weekend. ? ?TOC will continue to follow and assist with discharge planning. ? ?Expected Discharge Plan: [place] ?Barriers to Discharge: Continued Medical Work up, Ship broker ? ?Expected Discharge Plan and Services ?Expected Discharge Plan: [place] ?In-house Referral: Clinical Social Work ?  ?  ?  ?                ?  ?  ?  ?  ?  ?  ?  ?  ?  ?  ? ? ?Social Determinants of Health (SDOH) Interventions ?  ? ?Readmission Risk Interventions ? ?  07/12/2021  ? 12:38 PM 05/23/2021  ?  1:12 PM  ?Readmission Risk Prevention Plan  ?Transportation Screening Complete Complete  ?Medication Review Press photographer) Complete Complete  ?PCP or Specialist appointment within 3-5 days of discharge Complete Complete  ?[place] or Home Care Consult Complete Complete  ?SW Recovery Care/Counseling Consult Complete Complete  ?Palliative Care Screening Not Applicable Not Applicable  ?Skilled Nursing Facility Complete Not Applicable  ? ? ?

## 2021-08-16 NOTE — Progress Notes (Addendum)
?  Progress Note ? ? ? ?08/16/2021 ?8:03 AM ?2 Days Post-Op ? ?Subjective:  c/o pain ? ?Afebrile ? ?Vitals:  ? 08/16/21 0615 08/16/21 0748  ?BP: 135/69 135/62  ?Pulse: 83 84  ?Resp: 14 18  ?Temp: 97.6 ?F (36.4 ?C) 97.7 ?F (36.5 ?C)  ?SpO2: 100% 100%  ? ? ?Physical Exam: ?Incisions:  dressing removed and inicision looks good with staples in tact.  ? ? ?CBC ?   ?Component Value Date/Time  ? WBC 20.4 (H) 08/15/2021 0137  ? RBC 2.73 (L) 08/15/2021 0137  ? HGB 7.8 (L) 08/15/2021 0137  ? HCT 26.4 (L) 08/15/2021 0137  ? PLT 358 08/15/2021 0137  ? MCV 96.7 08/15/2021 0137  ? MCH 28.6 08/15/2021 0137  ? MCHC 29.5 (L) 08/15/2021 0137  ? RDW 18.3 (H) 08/15/2021 0137  ? LYMPHSABS 1.3 08/12/2021 0238  ? MONOABS 2.1 (H) 08/12/2021 0238  ? EOSABS 0.4 08/12/2021 0238  ? BASOSABS 0.1 08/12/2021 0238  ? ? ?BMET ?   ?Component Value Date/Time  ? NA 133 (L) 08/15/2021 0137  ? K 5.0 08/15/2021 0137  ? CL 98 08/15/2021 0137  ? CO2 22 08/15/2021 0137  ? GLUCOSE 228 (H) 08/15/2021 0137  ? BUN 36 (H) 08/15/2021 0137  ? CREATININE 11.39 (H) 08/15/2021 0137  ? CALCIUM 7.2 (L) 08/15/2021 0137  ? GFRNONAA 4 (L) 08/15/2021 0137  ? GFRAA 6 (L) 11/29/2019 0120  ? ? ?INR ?   ?Component Value Date/Time  ? INR 1.3 (H) 07/10/2021 9833  ? ? ? ?Intake/Output Summary (Last 24 hours) at 08/16/2021 0803 ?Last data filed at 08/16/2021 0620 ?Gross per 24 hour  ?Intake 200 ml  ?Output 2997 ml  ?Net -2797 ml  ? ? ? ?Assessment/Plan:  51 y.o. female is s/p right above knee amputation  2 Days Post-Op ? ?-dressing removed today and incision looks good.   ?-pain not controlled-will add Norco ?-will check back in on pt Monday if she is still hospitalized.  She will f/u with our office in 4 weeks for staple removal.  Our office will arrange appt.  Please call with questions.  ? ? ?Leontine Locket, PA-C ?[facility] ?[phone] ?08/16/2021 ?8:03 AM ? ? ?I have seen and evaluated the patient. I agree with the PA note as documented above.  Postop day 2  status post right above-knee amputation.  Dressing removed at bedside and stump looks great with staples in place.  Nursing asked about better oral pain control medicine and will add Norco.  Discussed staples stay about 4 to 6 weeks. ? ?Marty Heck, MD ?[facility] ?Office: [phone] ? ? ? ? ? ?  ? ?

## 2021-08-16 NOTE — Progress Notes (Signed)
?[facility] ?Progress Note  ? ?Subjective:    ?^d pain in past 12h.  Wound check looks good per VVS.  Awaiting dispo - to SNF soon.  ? ?Objective ?Vitals:  ? 08/15/21 2325 08/16/21 8889 08/16/21 1694 08/16/21 5038  ?BP: 121/62 135/69 135/62 (!) 141/69  ?Pulse: 87 83 84 83  ?Resp: 13 14 18    ?Temp: 97.8 ?F (36.6 ?C) 97.6 ?F (36.4 ?C) 97.7 ?F (36.5 ?C)   ?TempSrc: Oral Oral Oral   ?SpO2: 100% 100% 100% 100%  ?Weight:      ?Height:      ? ?Physical Exam ?General: Obese woman, on nasal O2, nad  ?Heart: Distant No m,r,g  ?Lungs: Clear bilaterally  ?Abdomen: soft, non -tender ?Extremities: LLE no edema, R AKA noted ?Dialysis Access: R IJ TDC c/d/i ?  ?Filed Weights  ? 08/15/21 0500 08/15/21 0722 08/15/21 1148  ?Weight: (!) 155.9 kg (!) 155.9 kg (!) 152.8 kg  ? ? ?Intake/Output Summary (Last 24 hours) at 08/16/2021 0928 ?Last data filed at 08/16/2021 0620 ?Gross per 24 hour  ?Intake 200 ml  ?Output 2997 ml  ?Net -2797 ml  ? ? ? ?Additional Objective ?Labs: ?Basic Metabolic Panel: ?Recent Labs  ?Lab 08/10/21 ?0258 08/12/21 ?8828 08/13/21 ?0034 08/14/21 ?9179 08/15/21 ?1505  ?NA 135 131* 132* 134* 133*  ?K 4.2 4.4 3.9 3.6 5.0  ?CL 97* 94* 96* 96* 98  ?CO2 23 22 21*  --  22  ?GLUCOSE 91 119* 129* 127* 228*  ?BUN 27* 26* 33* 26* 36*  ?CREATININE 11.58* 9.77* 12.02* 10.40* 11.39*  ?CALCIUM 7.8* 7.5* 7.5*  --  7.2*  ?PHOS 5.1* 4.3 5.2*  --   --   ? ? ?Liver Function Tests: ?Recent Labs  ?Lab 08/12/21 ?0238 08/13/21 ?6979  ?ALBUMIN 2.8* 2.7*  ? ? ?No results for input(s): LIPASE, AMYLASE in the last 168 hours. ?CBC: ?Recent Labs  ?Lab 08/10/21 ?0258 08/12/21 ?4801 08/13/21 ?6553 08/14/21 ?7482 08/15/21 ?7078  ?WBC 9.4 13.2* 14.1*  --  20.4*  ?NEUTROABS 7.0 9.1*  --   --   --   ?HGB 7.6* 8.5* 8.5* 9.9* 7.8*  ?HCT 26.1* 28.2* 29.4* 29.0* 26.4*  ?MCV 97.8 95.3 96.1  --  96.7  ?PLT 252 299 PLATELET CLUMPS NOTED ON SMEAR, UNABLE TO ESTIMATE  --  358  ? ? ?Blood Culture ?   ?Component Value Date/Time  ? SDES BLOOD LEFT  HAND 09/03/2020 2359  ? SPECREQUEST  09/03/2020 2359  ?  BOTTLES DRAWN AEROBIC AND ANAEROBIC Blood Culture adequate volume  ? CULT  09/03/2020 2359  ?  NO GROWTH 5 DAYS ?Performed at [facility], [address], [place], [place] [address] ?  ? REPTSTATUS 09/09/2020 FINAL 09/03/2020 2359  ? ? ?Cardiac Enzymes: ?No results for input(s): CKTOTAL, CKMB, CKMBINDEX, TROPONINI in the last 168 hours. ?CBG: ?Recent Labs  ?Lab 08/15/21 ?9201 08/15/21 ?1223 08/15/21 ?1630 08/15/21 ?2101 08/16/21 ?0071  ?GLUCAP 234* 222* 150* 242* 273*  ? ? ?Iron Studies: No results for input(s): IRON, TIBC, TRANSFERRIN, FERRITIN in the last 72 hours. ?Lab Results  ?Component Value Date  ? INR 1.3 (H) 07/10/2021  ? ?Studies/Results: ?No results found. ? ?Medications: ? sodium chloride    ?  ceFAZolin (ANCEF) IV    ? [START ON 08/17/2021]  ceFAZolin (ANCEF) IV    ? magnesium sulfate bolus IVPB    ? ? aspirin EC  81 mg Oral Q breakfast  ? atorvastatin  10 mg Oral QHS  ?  calcitRIOL  1 mcg Oral Q T,Th,Sa-HD  ? calcium carbonate  1,500 mg Oral TID with meals  ? calcium carbonate  1.5 tablet Oral With snacks  ? carvedilol  6.25 mg Oral BID WC  ? cinacalcet  30 mg Oral Q T,Th,Sa-HD  ? [START ON 08/17/2021] darbepoetin (ARANESP) injection - DIALYSIS  200 mcg Intravenous Q Sat-HD  ? docusate sodium  100 mg Oral BID  ? feeding supplement (NEPRO CARB STEADY)  237 mL Oral TID WC  ? gabapentin  100 mg Oral BID  ? heparin injection (subcutaneous)  5,000 Units Subcutaneous Q8H  ? insulin aspart  0-6 Units Subcutaneous TID WC  ? insulin glargine-yfgn  10 Units Subcutaneous Daily  ? lidocaine  1 patch Transdermal Q24H  ? lisinopril  20 mg Oral Daily  ? mupirocin ointment  1 application. Topical BID  ? neomycin-bacitracin-polymyxin   Topical BID  ? nystatin   Topical TID  ? pantoprazole  40 mg Oral Daily  ? polyethylene glycol  17 g Oral Daily  ? sodium chloride flush  10-40 mL Intracatheter Q12H  ? sodium chloride flush  3 mL Intravenous Q12H  ? ? ?Dialysis  Orders: ?[name] ? 4h 71min  165kg  1K/2.5Ca bath  TDC  400 BFR ? - 4/29 > HBsAg neg w/ Abs < 10 ? - mircera 200ug q2 ? - calcitriol 1 ug tiw ? - sensipar 30 mg tiw ? ?Assessment/Plan: ?Bilateral foot pain - w/ ischemic changes R foot+ gangrenous toes x VVS consulted. S/p Bilat LE angiogram 5/3 per Dr. Virl Cagey note - severe calcific disease bilaterally.  Underwent AKA on 5/10 ?ESRD - on HD TTS. HD tomorrow. Will need new EDW on DC.  ?Volume - euvolemic, UF 3L yesterday.  UF 2-3L tomorrow.  ?HTN - home meds per pmd as needed.  ?Anemia ckd - Hgb 8.5 > 7.8 post op. Redose ESA with HD Sat.  Checking iron indices.  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 Cont sensipar and vdra w/ HD tiw. On tums as binder. ?Chronic hypoxic respiratory failure - on home oxygen  ?Nutrition-Alb 2.8. Ordered Nepro supplement but gives diarrhea so not drinking.  Daughter can bring protein bar. ?OK for d/c per nephrology.   ?  ?Jannifer Hick MD ?[facility] ?Pager [phone] ? ?  ?

## 2021-08-16 NOTE — Progress Notes (Signed)
Mobility Specialist Progress Note ? ? 08/16/21 1824  ?Mobility  ?Activity  ?(Bed Level Exercise)  ?Range of Motion/Exercises All extremities  ?Level of Assistance Moderate assist, patient does 50-74%  ?Assistive Device  ?(HHA)  ?RLE Weight Bearing NWB  ?Activity Response Tolerated fair  ?$Mobility charge 1 Mobility  ? ?Pre Mobility: 94 HR, 125/71 BP, SpO2 ?During Mobility: 101 HR, 89% SpO2 ?Post Mobility: 87 HR, 94% SpO2 ? ?Session limited today d/t decr pain tolerance. Pt unable to manually maneuver R limb or receive assistance for R limb w/o being in excruciating pain. Resulted in bed level exercises targeted at strengthening core and hips. Tolerated exercises moderately, left w/ call bell in reach and all needs met. ? ?Holland Falling ?Mobility Specialist ?Phone Number [phone] ? ?

## 2021-08-16 NOTE — Evaluation (Signed)
Occupational Therapy Evaluation Patient Details Name: Courtney Gray MRN: [id number] DOB: [birth year] Today's Date: 08/16/2021   History of Present Illness 51 y.o. female who just discharged from rehab this past Monday and was told that she had run out of her days and that she needed to pay out-of-pocket for any further rehab.  She presented again with worsening right foot pain and there was a concern for ischemic foot. Pt also reports she dropped her O2 canister on her R foot.  She was admitted and transferred to [facility].  CT angiogram was inconclusive.  She underwent arteriogram on 08/07/2021. Podiatry discussing TMA versus chopart level amputation with the patient and vascular MD recommending more proximal amputation. She feels she needs more time to think about her options.  PMH: end-stage renal disease on hemodialysis, gout, diabetes mellitus type II, hypertension   Clinical Impression   Prior to this admission, patient was living with her daughter who would provide assist with ADL and patient would walk with her rollator household distances. Distances has lessened due to significant pain in R leg. Currently, patient requires significant time to participate due to anxiety and requires visuals in order to understand mobility. Patient engaging in bed mobility, long sitting, and grooming at bed level, taking maximum encouragement in order to complete. Patient is moderate to max A for ADLs, and mod to max A +2 for transfers OOB. OT recommending SNF at discharge due to current level of function. OT will continue to follow acutely.       Recommendations for follow up therapy are one component of a multi-disciplinary discharge planning process, led by the attending physician.  Recommendations may be updated based on patient status, additional functional criteria and insurance authorization.   Follow Up Recommendations  Skilled nursing-short term rehab (<3 hours/day)    Assistance Recommended  at Discharge Frequent or constant Supervision/Assistance  Patient can return home with the following Two people to help with walking and/or transfers;A lot of help with bathing/dressing/bathroom;Assistance with cooking/housework;Direct supervision/assist for financial management;Direct supervision/assist for medications management;Assist for transportation;Help with stairs or ramp for entrance    Functional Status Assessment  Patient has had a recent decline in their functional status and demonstrates the ability to make significant improvements in function in a reasonable and predictable amount of time.  Equipment Recommendations  Other (comment) (Defer to next venue)    Recommendations for Other Services       Precautions / Restrictions Precautions Precautions: Fall Precaution Comments: R AKA Restrictions Weight Bearing Restrictions: Yes      Mobility Bed Mobility Overal bed mobility: Needs Assistance Bed Mobility: Supine to Sit     Supine to sit: Supervision     General bed mobility comments: supervision to come into long sitting in bed, took 50 minutes to progress, did not assess sitting EOB to date    Transfers                   General transfer comment: Did not assess to date due to significant time needed for long sitting      Balance Overall balance assessment: Mild deficits observed, not formally tested                                         ADL either performed or assessed with clinical judgement   ADL Overall ADL's : Needs assistance/impaired Eating/Feeding: Set up;Sitting  Grooming: Set up;Sitting;Bed level   Upper Body Bathing: Set up;Bed level   Lower Body Bathing: Moderate assistance;Bed level   Upper Body Dressing : Set up;Bed level   Lower Body Dressing: Moderate assistance;Maximal assistance;Bed level   Toilet Transfer: +2 for physical assistance;+2 for safety/equipment;Maximal assistance;Total assistance Toilet  Transfer Details (indicate cue type and reason): Bed level         Functional mobility during ADLs: Moderate assistance;Maximal assistance;+2 for physical assistance;+2 for safety/equipment;Cueing for safety;Cueing for sequencing General ADL Comments: Patient with significant anxiety, taking 50 minutes to transition to long sitting and and work on shifting hips to promote OOB movement     Vision         Perception     Praxis      Pertinent Vitals/Pain Pain Assessment Pain Assessment: 0-10 Pain Score: 6  Pain Location: residual limb, phantom pain Pain Descriptors / Indicators: Discomfort Pain Intervention(s): Limited activity within patient's tolerance, Monitored during session, RN gave pain meds during session     Hand Dominance Left   Extremity/Trunk Assessment Upper Extremity Assessment Upper Extremity Assessment: Generalized weakness   Lower Extremity Assessment Lower Extremity Assessment: Defer to PT evaluation   Cervical / Trunk Assessment Cervical / Trunk Assessment: Other exceptions Cervical / Trunk Exceptions: increased body habitus   Communication Communication Communication: No difficulties   Cognition Arousal/Alertness: Awake/alert Behavior During Therapy: Anxious Overall Cognitive Status: Within Functional Limits for tasks assessed                                 General Comments: patient with anxiety and slow processing, but benefits from visuals in order to follow mobility (patient with anxiety and slow processing, but benefits from visuals in order to follow mobility)     General Comments       Exercises     Shoulder Instructions      Home Living Family/patient expects to be discharged to:: Private residence Living Arrangements: Children Available Help at Discharge: Family;Available PRN/intermittently Type of Home: Apartment Home Access: Stairs to enter Entrance Stairs-Number of Steps: 2 Entrance Stairs-Rails: None Home  Layout: One level     Bathroom Shower/Tub: Tub/shower unit;Curtain   Bathroom Toilet: Standard Bathroom Accessibility: No   Home Equipment: Hand held shower head;Shower seat;Cane - single point;Grab bars - tub/shower;Rollator (4 wheels)          Prior Functioning/Environment Prior Level of Function : Needs assist             Mobility Comments: 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, but recently assisted for transfers and using wheelchair due to severe right foot/ankle pain due to gout, "per patient ADLs Comments: daughter assists with pericare, bathing, dressing        OT Problem List: Decreased strength;Decreased range of motion;Decreased activity tolerance;Impaired balance (sitting and/or standing);Decreased coordination;Decreased cognition;Decreased safety awareness;Decreased knowledge of use of DME or AE;Decreased knowledge of precautions;Obesity;Pain      OT Treatment/Interventions: Therapeutic exercise;Self-care/ADL training;Energy conservation;DME and/or AE instruction;Manual therapy;Therapeutic activities;Cognitive remediation/compensation;Patient/family education;Balance training    OT Goals(Current goals can be found in the care plan section) Acute Rehab OT Goals Patient Stated Goal: to get better and get my prosthetic leg OT Goal Formulation: With patient Time For Goal Achievement: 08/30/21 Potential to Achieve Goals: Fair  OT Frequency: Min 2X/week    Co-evaluation PT/OT/SLP Co-Evaluation/Treatment: Yes Reason for Co-Treatment: For patient/therapist safety;To address functional/ADL transfers   OT goals addressed during session:  ADL's and self-care;Strengthening/ROM      AM-PAC OT "6 Clicks" Daily Activity     Outcome Measure Help from another person eating meals?: A Little Help from another person taking care of personal grooming?: A Little Help from another person toileting, which includes using toliet, bedpan, or urinal?: A Lot Help from  another person bathing (including washing, rinsing, drying)?: A Lot Help from another person to put on and taking off regular upper body clothing?: A Little Help from another person to put on and taking off regular lower body clothing?: A Lot 6 Click Score: 15   End of Session Nurse Communication: Mobility status;Need for lift equipment  Activity Tolerance: Patient limited by pain;Patient limited by fatigue Patient left: in bed;with call bell/phone within reach;with bed alarm set  OT Visit Diagnosis: Other abnormalities of gait and mobility (R26.89);Unsteadiness on feet (R26.81);Muscle weakness (generalized) (M62.81);History of falling (Z91.81);Pain Pain - Right/Left: Right Pain - part of body: Leg                Time: 2440-1027 OT Time Calculation (min): 48 min Charges:  OT General Charges $OT Visit: 1 Visit OT Evaluation $OT Eval Moderate Complexity: 1 Mod  Pollyann Glen E. Shanikka Wonders, OTR/L Acute Rehabilitation Services [phone]   Cherlyn Cushing 08/16/2021, 4:07 PM

## 2021-08-16 NOTE — Progress Notes (Signed)
?PROGRESS NOTE ? ? ? ?Chioma Mukherjee  ZDG:[id number] DOB: 01/31/1971 DOA: 08/03/2021 ?PCP: [facility], [facility]  ? ?Brief Narrative:  ?51 y.o. female with medical history significant of end-stage renal disease on hemodialysis, gout, diabetes mellitus type II, hypertension who just discharged from rehab this past Monday and was told that she had run out of her days and that she needed to pay out-of-pocket for any further rehab.  She presented again with worsening right foot pain and there was a concern for ischemic foot.  She was admitted and transferred to [facility].  CT angiogram was inconclusive.  She underwent arteriogram on 08/07/2021.  Podiatry following. ? ?Assessment & Plan: ?  ?Ischemic right foot ?-Evidence of gangrenous changes in some of her toes of the right foot. ?-CT angiogram was inconclusive due to significant calcification. ?-Vascular surgery following: Status post arteriogram on 08/07/2021 which showed severe calcific disease in bilateral lower extremities with no endovascular or open surgical options to improve perfusion.  IV heparin subsequently discontinued by vascular surgery. ?-Podiatry also following.  Recommended to start IV Ancef.  Podiatry recommended transmet or Chopart level.  Vascular is a following.  Patient instead made a decision to proceed with right AKA since she does not want to have multiple surgery and wants to have the surgery with the best chances and vascular had recommended her the best chance would be with AKA.  She underwent AKA on 08/14/2021.  Complains of pain but appears very comfortable.  Continue current pain management.  She will likely require discharge to SNF.  [facility] has rejected her.  Appreciate vascular surgery assistance.  I have sent a message to [facility] about update on placement.  Patient still complains of pain.  Vascular surgery has added Norco on top of IV Dilaudid. ? ?End-stage renal disease on hemodialysis ?-Nephrology following.  Dialysis as per  nephrology schedule ? ?Chronic respiratory failure with hypoxia ?-Chronically on 3 L oxygen via nasal cannula.  Respiratory status currently stable ? ?Diabetes mellitus type 2 with hypoglycemia ?-A1c 5.7. ?-Still has intermittent episodes of hypoglycemia.  Continue CBGs. ?-Glipizide and Lantus were discontinued during last hospitalization because of episodes of hypoglycemia.  Blood sugar were elevated yesterday, I had started her on Semglee 10 units but interestingly, this was not having given to the patient for some reason.  Blood sugar still remains elevated, continue Semglee 10 units as well as SSI. ? ?Essential hypertension ?Blood pressure labile but fairly controlled.  On Coreg.  Amlodipine on hold.  We will watch another day. ? ?Acute on chronic anemia of chronic kidney disease: Hemoglobin dropped from 9.9 yesterday to 7.8 today, likely postop anemia.  Monitor closely and transfuse if less than 7.  For some reason, morning labs are still not collected.  I have reordered CBC today.  We will follow-up. ? ?Hyperlipidemia ?-continue statin ? ?Hypersomnolence ?-Likely due to combination of illness and medications.  Gabapentin dose has been decreased.  Judicious use of narcotics.  Feeling much better and fully awake and alert Since 08/10/2021. ? ?History of hemorrhoids ?-Stable ? ?Questionable history of gout ?-apparently treated for gout of her right foot recently ? ?Morbid obesity ?-Outpatient follow-up ? ? ?DVT prophylaxis: Subcutaneous heparin  ?code Status: Full ?Family Communication: None at bedside ?Disposition Plan: ?Status is: Inpatient ?Remains inpatient appropriate because: Of severity of illness, patient to make decision about surgical options. ? ? ?Consultants: Vascular surgery/podiatry/nephrology ? ?Procedures: Left lower extremity arteriogram on 08/07/2021. ?Right AKA ?Antimicrobials: None ? ? ?Subjective: ? ?Seen  and examined.  No other complaint other than the pain in the right lower extremity.  When  discussed about discharge plan, she says " so you want to get rid of me?"  She was informed that it is not our intention but now that she is medically stable, we will need to discharge her once we find placement. ? ?Objective: ?Vitals:  ? 08/15/21 2325 08/16/21 2542 08/16/21 7062 08/16/21 3762  ?BP: 121/62 135/69 135/62 (!) 141/69  ?Pulse: 87 83 84 83  ?Resp: 13 14 18    ?Temp: 97.8 ?F (36.6 ?C) 97.6 ?F (36.4 ?C) 97.7 ?F (36.5 ?C)   ?TempSrc: Oral Oral Oral   ?SpO2: 100% 100% 100% 100%  ?Weight:      ?Height:      ? ? ?Intake/Output Summary (Last 24 hours) at 08/16/2021 1139 ?Last data filed at 08/16/2021 0620 ?Gross per 24 hour  ?Intake 200 ml  ?Output 2997 ml  ?Net -2797 ml  ? ?Filed Weights  ? 08/15/21 0500 08/15/21 0722 08/15/21 1148  ?Weight: (!) 155.9 kg (!) 155.9 kg (!) 152.8 kg  ? ? ?Examination: ? ?General exam: Appears calm and comfortable, morbidly obese ?Respiratory system: Clear to auscultation. Respiratory effort normal. ?Cardiovascular system: S1 & S2 heard, RRR. No JVD, murmurs, rubs, gallops or clicks. No pedal edema. ?Gastrointestinal system: Abdomen is nondistended, soft and nontender. No organomegaly or masses felt. Normal bowel sounds heard. ?Central nervous system: Alert and oriented. No focal neurological deficits. ?Extremities: Right AKA ?Skin: No rashes, lesions or ulcers.  ?Psychiatry: Judgement and insight appear normal. Mood & affect appropriate.  ? ? ?Data Reviewed: I have personally reviewed following labs and imaging studies ? ?CBC: ?Recent Labs  ?Lab 08/10/21 ?0258 08/12/21 ?8315 08/13/21 ?1761 08/14/21 ?6073 08/15/21 ?7106  ?WBC 9.4 13.2* 14.1*  --  20.4*  ?NEUTROABS 7.0 9.1*  --   --   --   ?HGB 7.6* 8.5* 8.5* 9.9* 7.8*  ?HCT 26.1* 28.2* 29.4* 29.0* 26.4*  ?MCV 97.8 95.3 96.1  --  96.7  ?PLT 252 299 PLATELET CLUMPS NOTED ON SMEAR, UNABLE TO ESTIMATE  --  358  ? ?Basic Metabolic Panel: ?Recent Labs  ?Lab 08/10/21 ?0258 08/12/21 ?2694 08/13/21 ?8546 08/14/21 ?2703 08/15/21 ?5009  ?NA 135  131* 132* 134* 133*  ?K 4.2 4.4 3.9 3.6 5.0  ?CL 97* 94* 96* 96* 98  ?CO2 23 22 21*  --  22  ?GLUCOSE 91 119* 129* 127* 228*  ?BUN 27* 26* 33* 26* 36*  ?CREATININE 11.58* 9.77* 12.02* 10.40* 11.39*  ?CALCIUM 7.8* 7.5* 7.5*  --  7.2*  ?PHOS 5.1* 4.3 5.2*  --   --   ? ?GFR: ?Estimated Creatinine Clearance: 8.8 mL/min (A) (by C-G formula based on SCr of 11.39 mg/dL (H)). ?Liver Function Tests: ?Recent Labs  ?Lab 08/12/21 ?0238 08/13/21 ?3818  ?ALBUMIN 2.8* 2.7*  ? ?No results for input(s): LIPASE, AMYLASE in the last 168 hours. ?No results for input(s): AMMONIA in the last 168 hours. ?Coagulation Profile: ?No results for input(s): INR, PROTIME in the last 168 hours. ?Cardiac Enzymes: ?No results for input(s): CKTOTAL, CKMB, CKMBINDEX, TROPONINI in the last 168 hours. ? ?BNP (last 3 results) ?No results for input(s): PROBNP in the last 8760 hours. ?HbA1C: ?No results for input(s): HGBA1C in the last 72 hours. ?CBG: ?Recent Labs  ?Lab 08/15/21 ?2993 08/15/21 ?1223 08/15/21 ?1630 08/15/21 ?2101 08/16/21 ?7169  ?GLUCAP 234* 222* 150* 242* 273*  ? ?Lipid Profile: ?No results for input(s): CHOL, HDL, LDLCALC, TRIG, CHOLHDL,  LDLDIRECT in the last 72 hours. ? ?Thyroid Function Tests: ?No results for input(s): TSH, T4TOTAL, FREET4, T3FREE, THYROIDAB in the last 72 hours. ?Anemia Panel: ?No results for input(s): VITAMINB12, FOLATE, FERRITIN, TIBC, IRON, RETICCTPCT in the last 72 hours. ?Sepsis Labs: ?No results for input(s): PROCALCITON, LATICACIDVEN in the last 168 hours. ? ?No results found for this or any previous visit (from the past 240 hour(s)).  ? ? ? ? ? ?Radiology Studies: ?No results found. ? ? ? ? ? ?Scheduled Meds: ? aspirin EC  81 mg Oral Q breakfast  ? atorvastatin  10 mg Oral QHS  ? calcitRIOL  1 mcg Oral Q T,Th,Sa-HD  ? calcium carbonate  1,500 mg Oral TID with meals  ? calcium carbonate  1.5 tablet Oral With snacks  ? carvedilol  6.25 mg Oral BID WC  ? cinacalcet  30 mg Oral Q T,Th,Sa-HD  ? [START ON 08/17/2021]  darbepoetin (ARANESP) injection - DIALYSIS  200 mcg Intravenous Q Sat-HD  ? docusate sodium  100 mg Oral BID  ? feeding supplement (NEPRO CARB STEADY)  237 mL Oral TID WC  ? gabapentin  100 mg Oral BID  ? hepa

## 2021-08-16 NOTE — Progress Notes (Signed)
Physical Therapy Treatment ?Patient Details ?Name: Courtney Gray ?MRN: [id number] ?DOB: 1971/02/07 ?Today's Date: 08/16/2021 ? ? ?History of Present Illness 51 y.o. female who just discharged from rehab this past Monday and was told that she had run out of her days and that she needed to pay out-of-pocket for any further rehab.  She presented again with worsening right foot pain and there was a concern for ischemic foot. Pt also reports she dropped her O2 canister on her R foot.  She was admitted and transferred to [facility].  CT angiogram was inconclusive.  She underwent arteriogram on 08/07/2021. 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 PMH: end-stage renal disease on hemodialysis, gout, diabetes mellitus type II, hypertension. ? ?  ?PT Comments  ? ? Pt received in supine, anxious regarding pain/mobility but expresses eagerness to progress functional mobility. Pt needing greatly increased time to initiate/perform bed mobility and able to progress to long sitting in bed with Supervision from elevated HOB posture and mod cues for sequencing/body mechanics. Emphasis on supine/seated LE exercises, benefits of frequent repositioning in bed for pressure relief, techniques for seated scooting for bed mobility and progression to EOB. Pt needing >30 mins to achieve long sitting after extensive discussion for technique and mental preparation, pt limited due to anxiety/pain after long sitting ~15 mins and ultimately defers transfer to edge of bed. Pt continues to benefit from PT services to progress toward functional mobility goals.   ?Recommendations for follow up therapy are one component of a multi-disciplinary discharge planning process, led by the attending physician.  Recommendations may be updated based on patient status, additional functional criteria and insurance authorization. ? ?Follow Up Recommendations ? Skilled nursing-short term rehab (<3 hours/day) ?  ?  ?Assistance Recommended at Discharge Frequent or constant  Supervision/Assistance  ?Patient can return home with the following Assistance with cooking/housework;Assist for transportation;Help with stairs or ramp for entrance;Two people to help with walking and/or transfers;Two people to help with bathing/dressing/bathroom;Direct supervision/assist for medications management ?  ?Equipment Recommendations ? Hospital bed (mechanical lift and wheelchair pending progress and surgical plan; all bariatric size)  ?  ?Recommendations for Other Services   ? ? ?  ?Precautions / Restrictions Precautions ?Precautions: Fall ?Precaution Comments: R AKA ?Restrictions ?Weight Bearing Restrictions: Yes ?RLE Weight Bearing: Non weight bearing  ?  ? ?Mobility ? Bed Mobility ?Overal bed mobility: Needs Assistance ?Bed Mobility: Supine to Sit ?  ?  ?Supine to sit: Supervision ?  ?  ?General bed mobility comments: supervision to come into long sitting in bed using both side rails after HOB elevated to ~50*;  she took 50 minutes to progress, did not assess sitting EOB today as pt defers to attempt. ?  ? ?Transfers ?  ?General transfer comment: Pt needing totalA for very limited posterior scooting in long sitting, pt guarding too much to hike hips/lift off bed. ?  ? ? ? ?  ?Balance Overall balance assessment: Needs assistance ?Sitting-balance support: No upper extremity supported, Feet unsupported ?Sitting balance-[name] Scale: Good ?Sitting balance - Comments: able to perform self-care tasks with BUE unsupported in long sit with no LOB ?  ?  ?  ?  ?  ?  ?  ? ?  ?Cognition Arousal/Alertness: Awake/alert ?Behavior During Therapy: Anxious ?Overall Cognitive Status: Within Functional Limits for tasks assessed ?  ?  ?  ?  ?  ?  ?  ?  ?  ?  ?General Comments: patient with anxiety and slow processing, but benefits from visuals in order  to follow mobility ?  ?  ? ?  ?Exercises General Exercises - Lower Extremity ?Ankle Circles/Pumps: Left, AROM, 5 reps, Supine ?Heel Slides: AROM, Left, 5 reps, Supine ?Hip  ABduction/ADduction: AROM, Left, 5 reps, Supine ?Other Exercises ?Other Exercises: cues for RLE AAROM, pt performed hip ab/adduction and "circles" with minimal ROM due to pain/body habitus ?Other Exercises: long sitting ~15 mins for core engagement/balance progression ?Other Exercises: lateral leans with elbow support to L/R sides ~60 sec each side ? ?  ?General Comments General comments (skin integrity, edema, etc.): SpO2 97-100% on 3L O2 [place], HR 86 bpm long sitting in bed, RR 15; no acute s/sx distress throughout. ?  ?  ? ?Pertinent Vitals/Pain Pain Assessment ?Pain Assessment: Faces ?Faces Pain Scale: Hurts even more ?Pain Location: Residual limb, phantom pain ?Pain Descriptors / Indicators: Discomfort, Operative site guarding, Grimacing ?Pain Intervention(s): Limited activity within patient's tolerance, Monitored during session, Repositioned, RN gave pain meds during session  ? ? ?Home Living Family/patient expects to be discharged to:: Private residence ?Living Arrangements: Children ?Available Help at Discharge: Family;Available PRN/intermittently ?Type of Home: Apartment ?Home Access: Stairs to enter ?Entrance Stairs-Rails: None ?Entrance Stairs-Number of Steps: 2 ?  ?Home Layout: One level ?Home Equipment: Hand held shower head;Shower seat;Cane - single point;Grab bars - tub/shower;Rollator (4 wheels) ?   ?  ?   ? ?PT Goals (current goals can now be found in the care plan section) Acute Rehab PT Goals ?Patient Stated Goal: decreased pain, increased independence ?PT Goal Formulation: With patient ?Potential to Achieve Goals: Fair ?Progress towards PT goals: Progressing toward goals (slowly progressing, to long sitting today) ? ?  ?Frequency ? ? ? Min 2X/week ? ? ? ?  ?PT Plan Current plan remains appropriate  ? ? ?Co-evaluation PT/OT/SLP Co-Evaluation/Treatment: Yes ?Reason for Co-Treatment: For patient/therapist safety;To address functional/ADL transfers ?PT goals addressed during session: Mobility/safety  with mobility;Balance;Strengthening/ROM ?OT goals addressed during session: ADL's and self-care;Strengthening/ROM ?  ? ?  ?AM-PAC PT "6 Clicks" Mobility   ?Outcome Measure ? Help needed turning from your back to your side while in a flat bed without using bedrails?: A Lot ?Help needed moving from lying on your back to sitting on the side of a flat bed without using bedrails?: A Lot ?Help needed moving to and from a bed to a chair (including a wheelchair)?: Total ?Help needed standing up from a chair using your arms (e.g., wheelchair or bedside chair)?: Total ?Help needed to walk in hospital room?: Total ?Help needed climbing 3-5 steps with a railing? : Total ?6 Click Score: 8 ? ?  ?End of Session Equipment Utilized During Treatment: Oxygen ?Activity Tolerance: Patient limited by pain;Other (comment) (anxiety limiting effort) ?Patient left: in bed;with call bell/phone within reach;with bed alarm set;Other (comment) (pt in long sitting in bed, awaiting RN return to assist with peri-care) ?Nurse Communication: Mobility status;Need for lift equipment;Other (comment) (not able to progress to EOB, pt agreeable now to have creams applied, likely needs peri-care as pt unable to clean this area during self-care tasks in session) ?PT Visit Diagnosis: Other abnormalities of gait and mobility (R26.89);Muscle weakness (generalized) (M62.81);Difficulty in walking, not elsewhere classified (R26.2);Pain ?Pain - Right/Left: Right ?Pain - part of body:  (AKA) ?  ? ? ?Time: 9476-5465 ?PT Time Calculation (min) (ACUTE ONLY): 48 min ? ?Charges:  $Therapeutic Exercise: 8-22 mins ?$Therapeutic Activity: 8-22 mins          ?          ? ?Courtney Gray  P., PTA ?Acute Rehabilitation Services ?Secure Chat Preferred 9a-5:30pm ?Office: [phone]  ? ? ?Courtney Gray ?08/16/2021, 4:56 PM ? ?

## 2021-08-16 NOTE — Care Management Important Message (Signed)
Important Message ? ?Patient Details  ?Name: Courtney Gray ?MRN: [id number] ?Date of Birth: 01-Nov-1970 ? ? ?Medicare Important Message Given:  Yes ? ? ? ? ?Shelda Altes ?08/16/2021, 7:58 AM ?

## 2021-08-16 NOTE — Progress Notes (Signed)
Patient refuses CHG bath, she states she is allergic to the wipes. Patient has also refused daily weight stating physical therapy staff can take her weight when they come for her therapy.  ?

## 2021-08-17 DIAGNOSIS — M79673 Pain in unspecified foot: Secondary | ICD-10-CM | POA: Diagnosis not present

## 2021-08-17 DIAGNOSIS — I998 Other disorder of circulatory system: Secondary | ICD-10-CM | POA: Diagnosis not present

## 2021-08-17 LAB — IRON AND TIBC
Iron: 28 ug/dL (ref 28–170)
Saturation Ratios: 20 % (ref 10.4–31.8)
TIBC: 141 ug/dL — ABNORMAL LOW (ref 250–450)
UIBC: 113 ug/dL

## 2021-08-17 LAB — GLUCOSE, CAPILLARY
Glucose-Capillary: 83 mg/dL (ref 70–99)
Glucose-Capillary: 142 mg/dL — ABNORMAL HIGH (ref 70–99)
Glucose-Capillary: 104 mg/dL — ABNORMAL HIGH (ref 70–99)
Glucose-Capillary: 151 mg/dL — ABNORMAL HIGH (ref 70–99)

## 2021-08-17 LAB — HEPATITIS B SURFACE ANTIGEN: Hepatitis B Surface Ag: NONREACTIVE

## 2021-08-17 LAB — FERRITIN: Ferritin: 996 ng/mL — ABNORMAL HIGH (ref 11–307)

## 2021-08-17 MED ORDER — AMLODIPINE BESYLATE 5 MG PO TABS
5.0000 mg | ORAL_TABLET | Freq: Every day | ORAL | Status: DC
  Filled 2021-08-17 (×9): qty 1

## 2021-08-17 MED ORDER — HEPARIN SODIUM (PORCINE) 1000 UNIT/ML IJ SOLN
INTRAMUSCULAR | Status: AC
  Filled 2021-08-17: qty 4

## 2021-08-17 NOTE — Progress Notes (Signed)
?[facility] ?Progress Note  ? ?Subjective:    ?Having gas pain and leg pain this AM.  Requesting HD later in the day which we will facilitate.  No other new issues.  ? ?Objective ?Vitals:  ? 08/17/21 0155 08/17/21 0332 08/17/21 0500 08/17/21 0715  ?BP: (!) 123/55 (!) 123/52  (!) 144/77  ?Pulse: 80 81  80  ?Resp: 13 13  17   ?Temp:  97.7 ?F (36.5 ?C)  97.9 ?F (36.6 ?C)  ?TempSrc:  Oral  Oral  ?SpO2: 100% 100%  100%  ?Weight:   (!) 151.1 kg   ?Height:      ? ?Physical Exam ?General: Obese woman, on nasal O2, nad but in pain ?Heart: Distant No m,r,g  ?Lungs: Clear bilaterally  ?Abdomen: soft, mild TTP ?Extremities: LLE no edema, R AKA noted staples c/d/i ?Dialysis Access: R IJ TDC c/d/i ?  ?Filed Weights  ? 08/15/21 0722 08/15/21 1148 08/17/21 0500  ?Weight: (!) 155.9 kg (!) 152.8 kg (!) 151.1 kg  ? ? ?Intake/Output Summary (Last 24 hours) at 08/17/2021 0842 ?Last data filed at 08/16/2021 2131 ?Gross per 24 hour  ?Intake 220 ml  ?Output --  ?Net 220 ml  ? ? ? ?Additional Objective ?Labs: ?Basic Metabolic Panel: ?Recent Labs  ?Lab 08/12/21 ?0238 08/13/21 ?1027 08/14/21 ?2536 08/15/21 ?0137  ?NA 131* 132* 134* 133*  ?K 4.4 3.9 3.6 5.0  ?CL 94* 96* 96* 98  ?CO2 22 21*  --  22  ?GLUCOSE 119* 129* 127* 228*  ?BUN 26* 33* 26* 36*  ?CREATININE 9.77* 12.02* 10.40* 11.39*  ?CALCIUM 7.5* 7.5*  --  7.2*  ?PHOS 4.3 5.2*  --   --   ? ? ?Liver Function Tests: ?Recent Labs  ?Lab 08/12/21 ?0238 08/13/21 ?6440  ?ALBUMIN 2.8* 2.7*  ? ? ?No results for input(s): LIPASE, AMYLASE in the last 168 hours. ?CBC: ?Recent Labs  ?Lab 08/12/21 ?0238 08/13/21 ?3474 08/14/21 ?2595 08/15/21 ?0137  ?WBC 13.2* 14.1*  --  20.4*  ?NEUTROABS 9.1*  --   --   --   ?HGB 8.5* 8.5* 9.9* 7.8*  ?HCT 28.2* 29.4* 29.0* 26.4*  ?MCV 95.3 96.1  --  96.7  ?PLT 299 PLATELET CLUMPS NOTED ON SMEAR, UNABLE TO ESTIMATE  --  358  ? ? ?Blood Culture ?   ?Component Value Date/Time  ? SDES BLOOD LEFT HAND 09/03/2020 2359  ? SPECREQUEST  09/03/2020 2359  ?   BOTTLES DRAWN AEROBIC AND ANAEROBIC Blood Culture adequate volume  ? CULT  09/03/2020 2359  ?  NO GROWTH 5 DAYS ?Performed at [facility], [address] [place], [place] [address] ?  ? REPTSTATUS 09/09/2020 FINAL 09/03/2020 2359  ? ? ?Cardiac Enzymes: ?No results for input(s): CKTOTAL, CKMB, CKMBINDEX, TROPONINI in the last 168 hours. ?CBG: ?Recent Labs  ?Lab 08/16/21 ?0618 08/16/21 ?1152 08/16/21 ?1554 08/16/21 ?2153 08/17/21 ?6433  ?GLUCAP 273* 107* 157* 113* 83  ? ? ?Iron Studies: No results for input(s): IRON, TIBC, TRANSFERRIN, FERRITIN in the last 72 hours. ?Lab Results  ?Component Value Date  ? INR 1.3 (H) 07/10/2021  ? ?Studies/Results: ?No results found. ? ?Medications: ? sodium chloride    ?  ceFAZolin (ANCEF) IV    ? magnesium sulfate bolus IVPB    ? ? aspirin EC  81 mg Oral Q breakfast  ? atorvastatin  10 mg Oral QHS  ? calcitRIOL  1 mcg Oral Q T,Th,Sa-HD  ? calcium carbonate  1,500 mg Oral TID with meals  ? calcium  carbonate  1.5 tablet Oral With snacks  ? carvedilol  6.25 mg Oral BID WC  ? cinacalcet  30 mg Oral Q T,Th,Sa-HD  ? darbepoetin (ARANESP) injection - DIALYSIS  200 mcg Intravenous Q Sat-HD  ? docusate sodium  100 mg Oral BID  ? feeding supplement (NEPRO CARB STEADY)  237 mL Oral TID WC  ? gabapentin  100 mg Oral BID  ? heparin injection (subcutaneous)  5,000 Units Subcutaneous Q8H  ? insulin aspart  0-6 Units Subcutaneous TID WC  ? insulin glargine-yfgn  10 Units Subcutaneous Daily  ? lidocaine  1 patch Transdermal Q24H  ? lisinopril  20 mg Oral Daily  ? mupirocin ointment  1 application. Topical BID  ? neomycin-bacitracin-polymyxin   Topical BID  ? nystatin   Topical TID  ? pantoprazole  40 mg Oral Daily  ? polyethylene glycol  17 g Oral Daily  ? sodium chloride flush  10-40 mL Intracatheter Q12H  ? sodium chloride flush  3 mL Intravenous Q12H  ? ? ?Dialysis Orders: ?[name] ? 4h 50min  165kg  1K/2.5Ca bath  TDC  400 BFR ? - 4/29 > HBsAg neg w/ Abs < 10 ? - mircera 200ug q2 ? -  calcitriol 1 ug tiw ? - sensipar 30 mg tiw ? ?Assessment/Plan: ?Bilateral foot pain - w/ ischemic changes R foot+ gangrenous toes x VVS consulted. S/p Bilat LE angiogram 5/3 per Dr. Virl Cagey note - severe calcific disease bilaterally.  Underwent AKA on 5/10 ?ESRD - on HD TTS. HD today. Will need new EDW on DC.  ?Volume - euvolemic, UF 3L 5/11.  UF 2-3L today.  ?HTN - home meds per pmd as needed.  ?Anemia ckd - Hgb 8.5 > 7.8 post op. Redose ESA with HD Sat.  Checking iron indices.  ?MBD ckd - CCa trending down and phos ok. Hold sensipar and cont vdra w/ HD tiw. On tums as binder. ?Chronic hypoxic respiratory failure - on home oxygen  ?Nutrition-Alb 2.8. Ordered Nepro supplement but gives diarrhea so not drinking.  Daughter can bring protein bar. ? ?OK for d/c per nephrology.   ?  ?Jannifer Hick MD ?[facility] ?Pager [phone] ? ?  ?

## 2021-08-17 NOTE — Plan of Care (Signed)
?  Problem: Health Behavior/Discharge Planning: ?Goal: Ability to manage health-related needs will improve ?Outcome: Progressing ?  ?Problem: Clinical Measurements: ?Goal: Ability to maintain clinical measurements within normal limits will improve ?Outcome: Progressing ?Goal: Will remain free from infection ?Outcome: Progressing ?Goal: Diagnostic test results will improve ?Outcome: Progressing ?Goal: Respiratory complications will improve ?Outcome: Progressing ?Goal: Cardiovascular complication will be avoided ?Outcome: Progressing ?  ?Problem: Activity: ?Goal: Risk for activity intolerance will decrease ?Outcome: Progressing ?  ?Problem: Nutrition: ?Goal: Adequate nutrition will be maintained ?Outcome: Progressing ?  ?Problem: Coping: ?Goal: Level of anxiety will decrease ?Outcome: Progressing ?  ?Problem: Elimination: ?Goal: Will not experience complications related to bowel motility ?Outcome: Progressing ?Goal: Will not experience complications related to urinary retention ?Outcome: Progressing ?  ?Problem: Pain Managment: ?Goal: General experience of comfort will improve ?Outcome: Progressing ?  ?Problem: Safety: ?Goal: Ability to remain free from injury will improve ?Outcome: Progressing ?  ?Problem: Skin Integrity: ?Goal: Risk for impaired skin integrity will decrease ?Outcome: Progressing ?  ?Problem: Education: ?Goal: Knowledge of disease and its progression will improve ?Outcome: Progressing ?  ?Problem: Fluid Volume: ?Goal: Compliance with measures to maintain balanced fluid volume will improve ?Outcome: Progressing ?  ?Problem: Health Behavior/Discharge Planning: ?Goal: Ability to manage health-related needs will improve ?Outcome: Progressing ?  ?Problem: Nutritional: ?Goal: Ability to make healthy dietary choices will improve ?Outcome: Progressing ?  ?Problem: Clinical Measurements: ?Goal: Complications related to the disease process, condition or treatment will be avoided or minimized ?Outcome:  Progressing ?  ?

## 2021-08-17 NOTE — Progress Notes (Signed)
?PROGRESS NOTE ? ? ? ?Courtney Gray  CBJ:[id number] DOB: [birth year] DOA: 08/03/2021 ?PCP: Medicine, [facility]  ? ?Brief Narrative:  ?51 y.o. female with medical history significant of end-stage renal disease on hemodialysis, gout, diabetes mellitus type II, hypertension who just discharged from rehab this past Monday and was told that she had run out of her days and that she needed to pay out-of-pocket for any further rehab.  She presented again with worsening right foot pain and there was a concern for ischemic foot.  She was admitted and transferred to [facility].  CT angiogram was inconclusive.  She underwent arteriogram on 08/07/2021.  Podiatry following. ? ?Assessment & Plan: ?  ?Ischemic right foot ?-Evidence of gangrenous changes in some of her toes of the right foot. ?-CT angiogram was inconclusive due to significant calcification. ?-Vascular surgery following: Status post arteriogram on 08/07/2021 which showed severe calcific disease in bilateral lower extremities with no endovascular or open surgical options to improve perfusion.  IV heparin subsequently discontinued by vascular surgery. ?-Podiatry also following.  Recommended to start IV Ancef.  Podiatry recommended transmet or [place] level.  Vascular is a following.  Patient instead made a decision to proceed with right AKA since she does not want to have multiple surgery and wants to have the surgery with the best chances and vascular had recommended her the best chance would be with AKA.  She underwent AKA on 08/14/2021.  Complains of pain but appears very comfortable.  Continue current pain management.  She will likely require discharge to SNF.  CIR has rejected her.  Appreciate vascular surgery assistance.  Vascular surgery has added Norco on top of IV Dilaudid.  Pain is now controlled. ? ?End-stage renal disease on hemodialysis ?-Nephrology following.  Dialysis as per nephrology schedule ? ?Chronic respiratory failure with  hypoxia ?-Chronically on 3 L oxygen via nasal cannula.  Respiratory status currently stable ? ?Diabetes mellitus type 2 with hypoglycemia ?-A1c 5.7. ?-Still has intermittent episodes of hypoglycemia.  Continue CBGs. ?-Glipizide and Lantus were discontinued during last hospitalization because of episodes of hypoglycemia.  On Semglee 10 units and SSI.  Blood sugar controlled. ? ?Essential hypertension ?Blood pressure labile but intermittently slightly elevated.  On Coreg.  Amlodipine on hold.  Will resume amlodipine at lower dose of 5 mg. ? ?Acute on chronic anemia of chronic kidney disease: Hemoglobin dropped from 9.9 yesterday to 7.8 today, likely postop anemia.  Monitor closely and transfuse if less than 7.  For some reason, labs were not collected yesterday and today. ? ?Hyperlipidemia ?-continue statin ? ?Hypersomnolence ?-Likely due to combination of illness and medications.  Gabapentin dose has been decreased.  Judicious use of narcotics.  Feeling much better and fully awake and alert Since 08/10/2021. ? ?History of hemorrhoids ?-Stable ? ?Questionable history of gout ?-apparently treated for gout of her right foot recently ? ?Morbid obesity ?-Outpatient follow-up ? ? ?DVT prophylaxis: Subcutaneous heparin  ?code Status: Full ?Family Communication: None at bedside ?Disposition Plan: ?Status is: Inpatient ?Remains inpatient appropriate because: Of severity of illness, patient to make decision about surgical options. ? ? ?Consultants: Vascular surgery/podiatry/nephrology ? ?Procedures: Left lower extremity arteriogram on 08/07/2021. ?Right AKA ?Antimicrobials: None ? ? ?Subjective: ? ?Seen and examined.  No complaints. ? ?Objective: ?Vitals:  ? 08/17/21 0500 08/17/21 0715 08/17/21 1009 08/17/21 1012  ?BP:  (!) 144/77 (!) 150/59 (!) 144/66  ?Pulse:  80 79 79  ?Resp:  17 17 (!) 22  ?Temp:  97.9 ?F (36.6 ?C)  98 ?F (36.7 ?C)   ?TempSrc:  Oral Tympanic   ?SpO2:  100% 100% 100%  ?Weight: (!) 151.1 kg  (!) 151 kg   ?Height:       ? ? ?Intake/Output Summary (Last 24 hours) at 08/17/2021 1033 ?Last data filed at 08/16/2021 2131 ?Gross per 24 hour  ?Intake 120 ml  ?Output --  ?Net 120 ml  ? ? ?Filed Weights  ? 08/15/21 1148 08/17/21 0500 08/17/21 1009  ?Weight: (!) 152.8 kg (!) 151.1 kg (!) 151 kg  ? ? ?Examination: ? ?General exam: Appears calm and comfortable, morbidly obese ?Respiratory system: Clear to auscultation. Respiratory effort normal. ?Cardiovascular system: S1 & S2 heard, RRR. No JVD, murmurs, rubs, gallops or clicks. No pedal edema. ?Gastrointestinal system: Abdomen is nondistended, soft and nontender. No organomegaly or masses felt. Normal bowel sounds heard. ?Central nervous system: Alert and oriented. No focal neurological deficits. ?Extremities: Right AKA ?Skin: No rashes, lesions or ulcers.  ?Psychiatry: Judgement and insight appear normal. Mood & affect appropriate.  ? ?Data Reviewed: I have personally reviewed following labs and imaging studies ? ?CBC: ?Recent Labs  ?Lab 08/12/21 ?0238 08/13/21 ?9562 08/14/21 ?1308 08/15/21 ?0137  ?WBC 13.2* 14.1*  --  20.4*  ?NEUTROABS 9.1*  --   --   --   ?HGB 8.5* 8.5* 9.9* 7.8*  ?HCT 28.2* 29.4* 29.0* 26.4*  ?MCV 95.3 96.1  --  96.7  ?PLT 299 PLATELET CLUMPS NOTED ON SMEAR, UNABLE TO ESTIMATE  --  358  ? ? ?Basic Metabolic Panel: ?Recent Labs  ?Lab 08/12/21 ?0238 08/13/21 ?6578 08/14/21 ?4696 08/15/21 ?0137  ?NA 131* 132* 134* 133*  ?K 4.4 3.9 3.6 5.0  ?CL 94* 96* 96* 98  ?CO2 22 21*  --  22  ?GLUCOSE 119* 129* 127* 228*  ?BUN 26* 33* 26* 36*  ?CREATININE 9.77* 12.02* 10.40* 11.39*  ?CALCIUM 7.5* 7.5*  --  7.2*  ?PHOS 4.3 5.2*  --   --   ? ? ?GFR: ?Estimated Creatinine Clearance: 8.7 mL/min (A) (by C-G formula based on SCr of 11.39 mg/dL (H)). ?Liver Function Tests: ?Recent Labs  ?Lab 08/12/21 ?0238 08/13/21 ?2952  ?ALBUMIN 2.8* 2.7*  ? ? ?No results for input(s): LIPASE, AMYLASE in the last 168 hours. ?No results for input(s): AMMONIA in the last 168 hours. ?Coagulation Profile: ?No  results for input(s): INR, PROTIME in the last 168 hours. ?Cardiac Enzymes: ?No results for input(s): CKTOTAL, CKMB, CKMBINDEX, TROPONINI in the last 168 hours. ? ?BNP (last 3 results) ?No results for input(s): PROBNP in the last 8760 hours. ?HbA1C: ?No results for input(s): HGBA1C in the last 72 hours. ?CBG: ?Recent Labs  ?Lab 08/16/21 ?0618 08/16/21 ?1152 08/16/21 ?1554 08/16/21 ?2153 08/17/21 ?8413  ?GLUCAP 273* 107* 157* 113* 83  ? ? ?Lipid Profile: ?No results for input(s): CHOL, HDL, LDLCALC, TRIG, CHOLHDL, LDLDIRECT in the last 72 hours. ? ?Thyroid Function Tests: ?No results for input(s): TSH, T4TOTAL, FREET4, T3FREE, THYROIDAB in the last 72 hours. ?Anemia Panel: ?No results for input(s): VITAMINB12, FOLATE, FERRITIN, TIBC, IRON, RETICCTPCT in the last 72 hours. ?Sepsis Labs: ?No results for input(s): PROCALCITON, LATICACIDVEN in the last 168 hours. ? ?No results found for this or any previous visit (from the past 240 hour(s)).  ? ? ? ? ? ?Radiology Studies: ?No results found. ? ? ? ? ? ?Scheduled Meds: ? aspirin EC  81 mg Oral Q breakfast  ? atorvastatin  10 mg Oral QHS  ? calcitRIOL  1 mcg Oral Q T,Th,Sa-HD  ?  calcium carbonate  1,500 mg Oral TID with meals  ? calcium carbonate  1.5 tablet Oral With snacks  ? carvedilol  6.25 mg Oral BID WC  ? darbepoetin (ARANESP) injection - DIALYSIS  200 mcg Intravenous Q Sat-HD  ? docusate sodium  100 mg Oral BID  ? feeding supplement (NEPRO CARB STEADY)  237 mL Oral TID WC  ? gabapentin  100 mg Oral BID  ? heparin injection (subcutaneous)  5,000 Units Subcutaneous Q8H  ? insulin aspart  0-6 Units Subcutaneous TID WC  ? insulin glargine-yfgn  10 Units Subcutaneous Daily  ? lidocaine  1 patch Transdermal Q24H  ? lisinopril  20 mg Oral Daily  ? mupirocin ointment  1 application. Topical BID  ? neomycin-bacitracin-polymyxin   Topical BID  ? nystatin   Topical TID  ? pantoprazole  40 mg Oral Daily  ? polyethylene glycol  17 g Oral Daily  ? sodium chloride flush  10-40 mL  Intracatheter Q12H  ? sodium chloride flush  3 mL Intravenous Q12H  ? ?Continuous Infusions: ? sodium chloride    ?  ceFAZolin (ANCEF) IV    ? magnesium sulfate bolus IVPB    ? ? ?Darliss Cheney, MD ?[facility]

## 2021-08-17 NOTE — Progress Notes (Incomplete)
Pt back from HD, refusing BP meds at this time due to history of BP bottoming out  ?

## 2021-08-17 NOTE — Progress Notes (Signed)
PT Cancellation Note ? ?Patient Details ?Name: Courtney Gray ?MRN: [id number] ?DOB: 04/15/1970 ? ? ?Cancelled Treatment:    Reason Eval/Treat Not Completed: Patient at procedure or test/unavailable this morning (at HD). Will continue to follow and attempt as time/schedule allows.  ? ?[place], PT, DPT  ? ?Acute Rehabilitation Department ?Pager #: [phone] - [phone] ? ? ?Sandra Cockayne ?08/17/2021, 11:34 AM ?

## 2021-08-17 NOTE — TOC Progression Note (Signed)
Transition of Care (TOC) - Progression Note  ? ? ?Patient Details  ?Name: Courtney Gray ?MRN: [id number] ?Date of Birth: 02/09/1971 ? ?Transition of Care (TOC) CM/SW Contact  ?Bary Castilla, LCSW ?Phone Number: [phone] ?08/17/2021, 4:14 PM ? ?Clinical Narrative:    ? ?Pt's authorization is still pending. ? ?TOC team will continue to assist with discharge planning needs.  ? ?Expected Discharge Plan: [place] ?Barriers to Discharge: Continued Medical Work up, Ship broker ? ?Expected Discharge Plan and Services ?Expected Discharge Plan: [place] ?In-house Referral: Clinical Social Work ?  ?  ?  ?                ?  ?  ?  ?  ?  ?  ?  ?  ?  ?  ? ? ?Social Determinants of Health (SDOH) Interventions ?  ? ?Readmission Risk Interventions ? ?  07/12/2021  ? 12:38 PM 05/23/2021  ?  1:12 PM  ?Readmission Risk Prevention Plan  ?Transportation Screening Complete Complete  ?Medication Review Press photographer) Complete Complete  ?PCP or Specialist appointment within 3-5 days of discharge Complete Complete  ?[place] or Home Care Consult Complete Complete  ?SW Recovery Care/Counseling Consult Complete Complete  ?Palliative Care Screening Not Applicable Not Applicable  ?Skilled Nursing Facility Complete Not Applicable  ? ? ?

## 2021-08-18 DIAGNOSIS — M79673 Pain in unspecified foot: Secondary | ICD-10-CM | POA: Diagnosis not present

## 2021-08-18 DIAGNOSIS — I998 Other disorder of circulatory system: Secondary | ICD-10-CM | POA: Diagnosis not present

## 2021-08-18 LAB — GLUCOSE, CAPILLARY
Glucose-Capillary: 166 mg/dL — ABNORMAL HIGH (ref 70–99)
Glucose-Capillary: 149 mg/dL — ABNORMAL HIGH (ref 70–99)
Glucose-Capillary: 203 mg/dL — ABNORMAL HIGH (ref 70–99)
Glucose-Capillary: 227 mg/dL — ABNORMAL HIGH (ref 70–99)

## 2021-08-18 MED ORDER — SODIUM CHLORIDE 0.9 % IV SOLN
125.0000 mg | INTRAVENOUS | Status: DC
  Administered 2021-08-20 – 2021-08-27 (×4): 125 mg via INTRAVENOUS
  Filled 2021-08-18 (×6): qty 10

## 2021-08-18 NOTE — Progress Notes (Signed)
?[facility] ?Progress Note  ? ?Subjective:    ?Tolerated HD ok yesterday, no new issues.  For d/c tomorrow daughter tells me.   ? ?Objective ?Vitals:  ? 08/17/21 1853 08/17/21 1958 08/18/21 0014 08/18/21 0400  ?BP: 132/67 (!) 145/65    ?Pulse: 85 85 78 94  ?Resp: 19 13 16 20   ?Temp: 98.2 ?F (36.8 ?C) 98.3 ?F (36.8 ?C)    ?TempSrc: Oral Oral Oral Oral  ?SpO2: 100% 100% 99% 98%  ?Weight:      ?Height:      ? ?Physical Exam ?General: Obese woman, on nasal O2, nad but in pain ?Heart: Distant No m,r,g  ?Lungs: Clear bilaterally  ?Abdomen: soft, mild TTP ?Extremities: LLE no edema, R AKA noted staples c/d/i ?Dialysis Access: R IJ TDC c/d/i ?  ?Filed Weights  ? 08/15/21 1148 08/17/21 0500 08/17/21 1009  ?Weight: (!) 152.8 kg (!) 151.1 kg (!) 151 kg  ? ? ?Intake/Output Summary (Last 24 hours) at 08/18/2021 0814 ?Last data filed at 08/17/2021 2037 ?Gross per 24 hour  ?Intake 3 ml  ?Output --  ?Net 3 ml  ? ? ? ?Additional Objective ?Labs: ?Basic Metabolic Panel: ?Recent Labs  ?Lab 08/12/21 ?0238 08/13/21 ?4967 08/14/21 ?5916 08/15/21 ?0137  ?NA 131* 132* 134* 133*  ?K 4.4 3.9 3.6 5.0  ?CL 94* 96* 96* 98  ?CO2 22 21*  --  22  ?GLUCOSE 119* 129* 127* 228*  ?BUN 26* 33* 26* 36*  ?CREATININE 9.77* 12.02* 10.40* 11.39*  ?CALCIUM 7.5* 7.5*  --  7.2*  ?PHOS 4.3 5.2*  --   --   ? ? ?Liver Function Tests: ?Recent Labs  ?Lab 08/12/21 ?0238 08/13/21 ?3846  ?ALBUMIN 2.8* 2.7*  ? ? ?No results for input(s): LIPASE, AMYLASE in the last 168 hours. ?CBC: ?Recent Labs  ?Lab 08/12/21 ?0238 08/13/21 ?6599 08/14/21 ?3570 08/15/21 ?0137  ?WBC 13.2* 14.1*  --  20.4*  ?NEUTROABS 9.1*  --   --   --   ?HGB 8.5* 8.5* 9.9* 7.8*  ?HCT 28.2* 29.4* 29.0* 26.4*  ?MCV 95.3 96.1  --  96.7  ?PLT 299 PLATELET CLUMPS NOTED ON SMEAR, UNABLE TO ESTIMATE  --  358  ? ? ?Blood Culture ?   ?Component Value Date/Time  ? SDES BLOOD LEFT HAND 09/03/2020 2359  ? SPECREQUEST  09/03/2020 2359  ?  BOTTLES DRAWN AEROBIC AND ANAEROBIC Blood Culture adequate  volume  ? CULT  09/03/2020 2359  ?  NO GROWTH 5 DAYS ?Performed at [facility], [address] [place], [place] [address] ?  ? REPTSTATUS 09/09/2020 FINAL 09/03/2020 2359  ? ? ?Cardiac Enzymes: ?No results for input(s): CKTOTAL, CKMB, CKMBINDEX, TROPONINI in the last 168 hours. ?CBG: ?Recent Labs  ?Lab 08/17/21 ?9030 08/17/21 ?1104 08/17/21 ?1706 08/17/21 ?2122 08/18/21 ?0923  ?GLUCAP 83 142* 104* 151* 166*  ? ? ?Iron Studies:  ?Recent Labs  ?  08/17/21 ?0929  ?IRON 28  ?TIBC 141*  ?FERRITIN 996*  ? ?Lab Results  ?Component Value Date  ? INR 1.3 (H) 07/10/2021  ? ?Studies/Results: ?No results found. ? ?Medications: ? sodium chloride    ?  ceFAZolin (ANCEF) IV 2 g (08/17/21 1657)  ? magnesium sulfate bolus IVPB    ? ? amLODipine  5 mg Oral Daily  ? aspirin EC  81 mg Oral Q breakfast  ? atorvastatin  10 mg Oral QHS  ? calcitRIOL  1 mcg Oral Q T,Th,Sa-HD  ? calcium carbonate  1,500 mg Oral TID with meals  ?  calcium carbonate  1.5 tablet Oral With snacks  ? carvedilol  6.25 mg Oral BID WC  ? darbepoetin (ARANESP) injection - DIALYSIS  200 mcg Intravenous Q Sat-HD  ? docusate sodium  100 mg Oral BID  ? feeding supplement (NEPRO CARB STEADY)  237 mL Oral TID WC  ? gabapentin  100 mg Oral BID  ? heparin injection (subcutaneous)  5,000 Units Subcutaneous Q8H  ? insulin aspart  0-6 Units Subcutaneous TID WC  ? insulin glargine-yfgn  10 Units Subcutaneous Daily  ? lidocaine  1 patch Transdermal Q24H  ? lisinopril  20 mg Oral Daily  ? mupirocin ointment  1 application. Topical BID  ? neomycin-bacitracin-polymyxin   Topical BID  ? nystatin   Topical TID  ? pantoprazole  40 mg Oral Daily  ? polyethylene glycol  17 g Oral Daily  ? sodium chloride flush  10-40 mL Intracatheter Q12H  ? sodium chloride flush  3 mL Intravenous Q12H  ? ? ?Dialysis Orders: ?[name] ? 4h 41min  165kg  1K/2.5Ca bath  TDC  400 BFR ? - 4/29 > HBsAg neg w/ Abs < 10 ? - mircera 200ug q2 ? - calcitriol 1 ug tiw ? - sensipar 30 mg  tiw ? ?Assessment/Plan: ?Bilateral foot pain - w/ ischemic changes R foot+ gangrenous toes x VVS consulted. S/p Bilat LE angiogram 5/3 per Dr. Virl Cagey note - severe calcific disease bilaterally.  Underwent AKA on 5/10 ?ESRD - on HD TTS. HD today. Will need new EDW on DC.  ?Volume - euvolemic, has been tolerated UF 3L with HD.  Will need new EDW on d/c.  Would say 151kg.  ?HTN - home meds per pmd as needed.  ?Anemia ckd - Hgb 8.5 > 7.8 post op. Redose ESA with HD Sat.  Iron sat 20% - give IV iron with hD.  ?MBD ckd - CCa trending down and phos ok. Hold sensipar and cont vdra w/ HD tiw. On tums as binder. ?Chronic hypoxic respiratory failure - on home oxygen  ?Nutrition-Alb 2.8. Ordered Nepro supplement but gives diarrhea so not drinking.  Daughter can bring protein bar. ? ?OK for d/c per nephrology.   ?  ?Jannifer Hick MD ?[facility] ?Pager [phone] ? ?  ?

## 2021-08-18 NOTE — TOC Progression Note (Addendum)
Transition of Care (TOC) - Progression Note  ? ? ?Patient Details  ?Name: Courtney Gray ?MRN: [id number] ?Date of Birth: 23-Jul-1970 ? ?Transition of Care (TOC) CM/SW Contact  ?Bary Castilla, LCSW ?Phone Number:[phone] ?08/18/2021, 11:58 AM ? ?Clinical Narrative:    ? ?CSW checked pt's authorization and it has been approved. ? Everlene Balls #[id number] ?Health Plan #- [id number] ?Authorization dates: 5/14-5/17 ?Fax Number: 1 [phone]   ? ?CSW spoke with [place] from [place] and she let CSW that she does not have any females bed due to renovations and it may not be until Tuesday and to have weekday CSW to follow up on Monday for a better DC date.  ? ?Pt currently does not have any other bed offers. ? ?TOC team will continue to assist with discharge planning needs.  ? ?Expected Discharge Plan: [place] ?Barriers to Discharge: Continued Medical Work up, Ship broker ? ?Expected Discharge Plan and Services ?Expected Discharge Plan: [place] ?In-house Referral: Clinical Social Work ?  ?  ?  ?                ?  ?  ?  ?  ?  ?  ?  ?  ?  ?  ? ? ?Social Determinants of Health (SDOH) Interventions ?  ? ?Readmission Risk Interventions ? ?  07/12/2021  ? 12:38 PM 05/23/2021  ?  1:12 PM  ?Readmission Risk Prevention Plan  ?Transportation Screening Complete Complete  ?Medication Review Press photographer) Complete Complete  ?PCP or Specialist appointment within 3-5 days of discharge Complete Complete  ?[place] or Home Care Consult Complete Complete  ?SW Recovery Care/Counseling Consult Complete Complete  ?Palliative Care Screening Not Applicable Not Applicable  ?Skilled Nursing Facility Complete Not Applicable  ? ? ?

## 2021-08-18 NOTE — Progress Notes (Signed)
?PROGRESS NOTE ? ? ? ?Courtney Gray  RXV:[id number] DOB: Jan 05, 1971 DOA: 08/03/2021 ?PCP: [facility]  ? ?Brief Narrative:  ?51 y.o. female with medical history significant of end-stage renal disease on hemodialysis, gout, diabetes mellitus type II, hypertension who just discharged from rehab this past Monday and was told that she had run out of her days and that she needed to pay out-of-pocket for any further rehab.  She presented again with worsening right foot pain and there was a concern for ischemic foot.  She was admitted and transferred to [facility].  CT angiogram was inconclusive.  She underwent arteriogram on 08/07/2021.  Podiatry following. ? ?Assessment & Plan: ?  ?Ischemic right foot ?-Evidence of gangrenous changes in some of her toes of the right foot. ?-CT angiogram was inconclusive due to significant calcification. ?-Vascular surgery following: Status post arteriogram on 08/07/2021 which showed severe calcific disease in bilateral lower extremities with no endovascular or open surgical options to improve perfusion.  IV heparin subsequently discontinued by vascular surgery. ?-Podiatry also following.  Recommended to start IV Ancef.  Podiatry recommended transmet or [place] level.  Vascular is a following.  Patient instead made a decision to proceed with right AKA since she does not want to have multiple surgery and wants to have the surgery with the best chances and vascular had recommended her the best chance would be with AKA.  She underwent AKA on 08/14/2021.  Complains of pain but appears very comfortable.  Continue current pain management.  She will likely require discharge to SNF.  CIR has rejected her.  Appreciate vascular surgery assistance.  Vascular surgery has added Norco on top of IV Dilaudid.  Pain is now controlled. ? ?End-stage renal disease on hemodialysis ?-Nephrology following.  Dialysis as per nephrology schedule ? ?Chronic respiratory failure with  hypoxia ?-Chronically on 3 L oxygen via nasal cannula.  Respiratory status currently stable ? ?Diabetes mellitus type 2 with hypoglycemia ?-A1c 5.7. ?-Still has intermittent episodes of hypoglycemia.  Continue CBGs. ?-Glipizide and Lantus were discontinued during last hospitalization because of episodes of hypoglycemia.  On Semglee 10 units and SSI.  Blood sugar controlled. ? ?Essential hypertension ?Blood pressure labile but intermittently slightly elevated.  On Coreg.  Amlodipine on hold.  Will resume amlodipine at lower dose of 5 mg. ? ?Acute on chronic anemia of chronic kidney disease: Hemoglobin dropped from 9.9 yesterday to 7.8 today, likely postop anemia.  Monitor closely and transfuse if less than 7.  For some reason, labs were not collected yesterday and today. ? ?Hyperlipidemia ?-continue statin ? ?Hypersomnolence ?-Likely due to combination of illness and medications.  Gabapentin dose has been decreased.  Judicious use of narcotics.  Feeling much better and fully awake and alert Since 08/10/2021. ? ?History of hemorrhoids ?-Stable ? ?Questionable history of gout ?-apparently treated for gout of her right foot recently ? ?Morbid obesity ?-Outpatient follow-up ? ? ?DVT prophylaxis: Subcutaneous heparin  ?code Status: Full ?Family Communication: None at bedside ?Disposition Plan: ?Status is: Inpatient ?Remains inpatient appropriate because: Patient is medically stable.  Waiting for SNF bed placement. ? ? ?Consultants: Vascular surgery/podiatry/nephrology ? ?Procedures: Left lower extremity arteriogram on 08/07/2021. ?Right AKA ?Antimicrobials: None ? ? ?Subjective: ? ?Seen and examined.  She is doing well.  No complaints.  Daughter at the bedside. ? ?Objective: ?Vitals:  ? 08/17/21 1958 08/18/21 0014 08/18/21 0400 08/18/21 0855  ?BP: (!) 145/65   136/61  ?Pulse: 85 78 94 79  ?Resp: 13 16 20 19   ?  Temp: 98.3 ?F (36.8 ?C)   97.8 ?F (36.6 ?C)  ?TempSrc: Oral Oral Oral Oral  ?SpO2: 100% 99% 98% 100%  ?Weight:       ?Height:      ? ? ?Intake/Output Summary (Last 24 hours) at 08/18/2021 1044 ?Last data filed at 08/17/2021 2037 ?Gross per 24 hour  ?Intake 3 ml  ?Output --  ?Net 3 ml  ? ? ?Filed Weights  ? 08/15/21 1148 08/17/21 0500 08/17/21 1009  ?Weight: (!) 152.8 kg (!) 151.1 kg (!) 151 kg  ? ? ?Examination: ? ?General exam: Appears calm and comfortable, morbidly obese ?Respiratory system: Clear to auscultation. Respiratory effort normal. ?Cardiovascular system: S1 & S2 heard, RRR. No JVD, murmurs, rubs, gallops or clicks. No pedal edema. ?Gastrointestinal system: Abdomen is nondistended, soft and nontender. No organomegaly or masses felt. Normal bowel sounds heard. ?Central nervous system: Alert and oriented. No focal neurological deficits. ?Extremities: Right AKA ?Skin: No rashes, lesions or ulcers.  ?Psychiatry: Judgement and insight appear normal. Mood & affect appropriate.  ? ? ?Data Reviewed: I have personally reviewed following labs and imaging studies ? ?CBC: ?Recent Labs  ?Lab 08/12/21 ?0238 08/13/21 ?2703 08/14/21 ?5009 08/15/21 ?0137  ?WBC 13.2* 14.1*  --  20.4*  ?NEUTROABS 9.1*  --   --   --   ?HGB 8.5* 8.5* 9.9* 7.8*  ?HCT 28.2* 29.4* 29.0* 26.4*  ?MCV 95.3 96.1  --  96.7  ?PLT 299 PLATELET CLUMPS NOTED ON SMEAR, UNABLE TO ESTIMATE  --  358  ? ? ?Basic Metabolic Panel: ?Recent Labs  ?Lab 08/12/21 ?0238 08/13/21 ?3818 08/14/21 ?2993 08/15/21 ?0137  ?NA 131* 132* 134* 133*  ?K 4.4 3.9 3.6 5.0  ?CL 94* 96* 96* 98  ?CO2 22 21*  --  22  ?GLUCOSE 119* 129* 127* 228*  ?BUN 26* 33* 26* 36*  ?CREATININE 9.77* 12.02* 10.40* 11.39*  ?CALCIUM 7.5* 7.5*  --  7.2*  ?PHOS 4.3 5.2*  --   --   ? ? ?GFR: ?Estimated Creatinine Clearance: 8.7 mL/min (A) (by C-G formula based on SCr of 11.39 mg/dL (H)). ?Liver Function Tests: ?Recent Labs  ?Lab 08/12/21 ?0238 08/13/21 ?7169  ?ALBUMIN 2.8* 2.7*  ? ? ?No results for input(s): LIPASE, AMYLASE in the last 168 hours. ?No results for input(s): AMMONIA in the last 168 hours. ?Coagulation  Profile: ?No results for input(s): INR, PROTIME in the last 168 hours. ?Cardiac Enzymes: ?No results for input(s): CKTOTAL, CKMB, CKMBINDEX, TROPONINI in the last 168 hours. ? ?BNP (last 3 results) ?No results for input(s): PROBNP in the last 8760 hours. ?HbA1C: ?No results for input(s): HGBA1C in the last 72 hours. ?CBG: ?Recent Labs  ?Lab 08/17/21 ?6789 08/17/21 ?1104 08/17/21 ?1706 08/17/21 ?2122 08/18/21 ?3810  ?GLUCAP 83 142* 104* 151* 166*  ? ? ?Lipid Profile: ?No results for input(s): CHOL, HDL, LDLCALC, TRIG, CHOLHDL, LDLDIRECT in the last 72 hours. ? ?Thyroid Function Tests: ?No results for input(s): TSH, T4TOTAL, FREET4, T3FREE, THYROIDAB in the last 72 hours. ?Anemia Panel: ?Recent Labs  ?  08/17/21 ?0929  ?FERRITIN 996*  ?TIBC 141*  ?IRON 28  ? ?Sepsis Labs: ?No results for input(s): PROCALCITON, LATICACIDVEN in the last 168 hours. ? ?No results found for this or any previous visit (from the past 240 hour(s)).  ? ? ? ? ? ?Radiology Studies: ?No results found. ? ? ? ? ? ?Scheduled Meds: ? amLODipine  5 mg Oral Daily  ? aspirin EC  81 mg Oral Q breakfast  ? atorvastatin  10 mg Oral QHS  ? calcitRIOL  1 mcg Oral Q T,Th,Sa-HD  ? calcium carbonate  1,500 mg Oral TID with meals  ? calcium carbonate  1.5 tablet Oral With snacks  ? carvedilol  6.25 mg Oral BID WC  ? darbepoetin (ARANESP) injection - DIALYSIS  200 mcg Intravenous Q Sat-HD  ? docusate sodium  100 mg Oral BID  ? feeding supplement (NEPRO CARB STEADY)  237 mL Oral TID WC  ? gabapentin  100 mg Oral BID  ? heparin injection (subcutaneous)  5,000 Units Subcutaneous Q8H  ? insulin aspart  0-6 Units Subcutaneous TID WC  ? insulin glargine-yfgn  10 Units Subcutaneous Daily  ? lidocaine  1 patch Transdermal Q24H  ? lisinopril  20 mg Oral Daily  ? mupirocin ointment  1 application. Topical BID  ? neomycin-bacitracin-polymyxin   Topical BID  ? nystatin   Topical TID  ? pantoprazole  40 mg Oral Daily  ? polyethylene glycol  17 g Oral Daily  ? sodium chloride  flush  10-40 mL Intracatheter Q12H  ? sodium chloride flush  3 mL Intravenous Q12H  ? ?Continuous Infusions: ? sodium chloride    ?  ceFAZolin (ANCEF) IV 2 g (08/17/21 1657)  ? [START ON 08/20/2021] ferric gluconate (FERRLECIT

## 2021-08-19 DIAGNOSIS — M79673 Pain in unspecified foot: Secondary | ICD-10-CM | POA: Diagnosis not present

## 2021-08-19 DIAGNOSIS — I998 Other disorder of circulatory system: Secondary | ICD-10-CM | POA: Diagnosis not present

## 2021-08-19 LAB — GLUCOSE, CAPILLARY
Glucose-Capillary: 133 mg/dL — ABNORMAL HIGH (ref 70–99)
Glucose-Capillary: 111 mg/dL — ABNORMAL HIGH (ref 70–99)
Glucose-Capillary: 125 mg/dL — ABNORMAL HIGH (ref 70–99)
Glucose-Capillary: 113 mg/dL — ABNORMAL HIGH (ref 70–99)

## 2021-08-19 MED ORDER — SODIUM CHLORIDE 0.9 % IV SOLN: 100.0000 mL | INTRAVENOUS | Status: DC | PRN

## 2021-08-19 MED ORDER — PENTAFLUOROPROP-TETRAFLUOROETH EX AERO: 1.0000 "application " | INHALATION_SPRAY | CUTANEOUS | Status: DC | PRN

## 2021-08-19 MED ORDER — HEPARIN SODIUM (PORCINE) 1000 UNIT/ML DIALYSIS
1000.0000 [IU] | INTRAMUSCULAR | Status: DC | PRN
  Filled 2021-08-19: qty 1

## 2021-08-19 MED ORDER — LIDOCAINE-PRILOCAINE 2.5-2.5 % EX CREA: 1.0000 "application " | TOPICAL_CREAM | CUTANEOUS | Status: DC | PRN

## 2021-08-19 MED ORDER — ALTEPLASE 2 MG IJ SOLR
2.0000 mg | Freq: Once | INTRAMUSCULAR | Status: DC | PRN
Start: 2021-08-19 — End: 2021-08-20

## 2021-08-19 MED ORDER — HYDROCODONE-ACETAMINOPHEN 5-325 MG PO TABS: 1.0000 | ORAL_TABLET | ORAL | 0 refills | Status: DC | PRN

## 2021-08-19 MED ORDER — LIDOCAINE HCL (PF) 1 % IJ SOLN: 5.0000 mL | INTRAMUSCULAR | Status: DC | PRN

## 2021-08-19 NOTE — Progress Notes (Signed)
? [facility] ?Progress Note  ? ?Subjective:   Patient seen and examined at bedside.  Patient reports pain, swelling and non healing ulcer in L middle finger.  States a few weeks ago she developed a blood blister from a finger stick.  She then drained it and later peeled the skin around it and has been doing wound care to try to help improve it but it is now getting worse.  Per nurse this AM the skin was soft and wet d/t antibiotic ointment and bandaid.  Nurse reports it now appears more swollen than previously with surrounding erythema.   ? ?Objective ?Vitals:  ? 08/18/21 2355 08/19/21 0330 08/19/21 0810 08/19/21 1236  ?BP: (!) 123/54 130/65 (!) 129/52 139/66  ?Pulse: 89 79 84 81  ?Resp: 17 16 19 19   ?Temp: 98.2 ?F (36.8 ?C) 98.2 ?F (36.8 ?C) 98.1 ?F (36.7 ?C) 98.1 ?F (36.7 ?C)  ?TempSrc: Oral Oral Oral Oral  ?SpO2: 100% 100% 100% 100%  ?Weight:  (!) 153.2 kg    ?Height:      ? ?Physical Exam ?[place] female in NAD, on O2 ?Heart:RRR, no mrg ?Lungs:CTAB, nml WOB on RA ?Abdomen:soft, NTND ?Extremities:no LE edema, R AKA w/staples in place, L middle finger with non healing ulcer with surrounding erythema and edema ?Dialysis Access: R IJ TDC  ? ?Filed Weights  ? 08/17/21 0500 08/17/21 1009 08/19/21 0330  ?Weight: (!) 151.1 kg (!) 151 kg (!) 153.2 kg  ? ? ?Intake/Output Summary (Last 24 hours) at 08/19/2021 1428 ?Last data filed at 08/18/2021 2101 ?Gross per 24 hour  ?Intake 3 ml  ?Output --  ?Net 3 ml  ? ? ?Additional Objective ?Labs: ?Basic Metabolic Panel: ?Recent Labs  ?Lab 08/13/21 ?1601 08/14/21 ?0807 08/15/21 ?0137  ?NA 132* 134* 133*  ?K 3.9 3.6 5.0  ?CL 96* 96* 98  ?CO2 21*  --  22  ?GLUCOSE 129* 127* 228*  ?BUN 33* 26* 36*  ?CREATININE 12.02* 10.40* 11.39*  ?CALCIUM 7.5*  --  7.2*  ?PHOS 5.2*  --   --   ? ?Liver Function Tests: ?Recent Labs  ?Lab 08/13/21 ?0932  ?ALBUMIN 2.7*  ? ?CBC: ?Recent Labs  ?Lab 08/13/21 ?3557 08/14/21 ?0807 08/15/21 ?0137  ?WBC 14.1*  --  20.4*  ?HGB 8.5* 9.9* 7.8*   ?HCT 29.4* 29.0* 26.4*  ?MCV 96.1  --  96.7  ?PLT PLATELET CLUMPS NOTED ON SMEAR, UNABLE TO ESTIMATE  --  358  ? ?CBG: ?Recent Labs  ?Lab 08/18/21 ?1121 08/18/21 ?1632 08/18/21 ?2128 08/19/21 ?3220 08/19/21 ?1228  ?GLUCAP 149* 203* 227* 133* 111*  ? ?Iron Studies:  ?Recent Labs  ?  08/17/21 ?0929  ?IRON 28  ?TIBC 141*  ?FERRITIN 996*  ? ?Lab Results  ?Component Value Date  ? INR 1.3 (H) 07/10/2021  ? ?Studies/Results: ?No results found. ? ?Medications: ? sodium chloride    ? [START ON 08/20/2021] ferric gluconate (FERRLECIT) IVPB    ? magnesium sulfate bolus IVPB    ? ? amLODipine  5 mg Oral Daily  ? aspirin EC  81 mg Oral Q breakfast  ? atorvastatin  10 mg Oral QHS  ? calcitRIOL  1 mcg Oral Q T,Th,Sa-HD  ? calcium carbonate  1,500 mg Oral TID with meals  ? calcium carbonate  1.5 tablet Oral With snacks  ? carvedilol  6.25 mg Oral BID WC  ? darbepoetin (ARANESP) injection - DIALYSIS  200 mcg Intravenous Q Sat-HD  ? docusate sodium  100 mg Oral BID  ?  feeding supplement (NEPRO CARB STEADY)  237 mL Oral TID WC  ? gabapentin  100 mg Oral BID  ? heparin injection (subcutaneous)  5,000 Units Subcutaneous Q8H  ? insulin aspart  0-6 Units Subcutaneous TID WC  ? insulin glargine-yfgn  10 Units Subcutaneous Daily  ? lidocaine  1 patch Transdermal Q24H  ? lisinopril  20 mg Oral Daily  ? mupirocin ointment  1 application. Topical BID  ? neomycin-bacitracin-polymyxin   Topical BID  ? nystatin   Topical TID  ? pantoprazole  40 mg Oral Daily  ? polyethylene glycol  17 g Oral Daily  ? sodium chloride flush  10-40 mL Intracatheter Q12H  ? sodium chloride flush  3 mL Intravenous Q12H  ? ? ?Dialysis Orders: ?[name] ? 4h 20min  165kg  1K/2.5Ca bath  TDC  400 BFR ? - 4/29 > HBsAg neg w/ Abs < 10 ? - mircera 200ug q2 ? - calcitriol 1 ug tiw ? - sensipar 30 mg tiw ?  ?Assessment/Plan: ?Bilateral foot pain - w/ ischemic changes R foot+ gangrenous toes x VVS consulted. S/p Bilat LE angiogram 5/3 per Dr. Virl Cagey note - severe  calcific disease bilaterally.  Underwent AKA on 5/10 ?Pain in R middle finger non healing ulcer w/surrounding erythema/edema - nurse notified PMD.  ?ESRD - on HD TTS. HD tomorrow per regular schedule.  ?Volume - euvolemic, has been tolerated UF 3L with HD.  Will need new EDW on d/c. ~151kg.  ?HTN - Blood pressure in goal. Home meds per pmd as needed.  ?Anemia ckd - Hgb 8.5 > 7.8 post op. Redose ESA with HD tomorrow.  Iron sat 20% - give IV iron with hD.  ?MBD ckd - CCa trending down and phos ok. Hold sensipar and cont vdra w/ HD tiw. On tums as binder. ?Chronic hypoxic respiratory failure - on home oxygen  ?Nutrition- Renal diet w/fluid restrictions.  Alb 2.8. Ordered Nepro supplement but gives diarrhea so not drinking.  Daughter can bring protein bar. ?Dispo - SNF, waiting for bed placement ? ?Jen Mow, PA-C ?[facility] ?08/19/2021,2:28 PM ? LOS: 15 days  ? ? ?

## 2021-08-19 NOTE — Progress Notes (Addendum)
?  Progress Note ? ? ? ?08/19/2021 ?7:47 AM ?5 Days Post-Op ? ?Subjective:  no new complaints ? ? ?Vitals:  ? 08/18/21 2355 08/19/21 0330  ?BP: (!) 123/54 130/65  ?Pulse: 89 79  ?Resp: 17 16  ?Temp: 98.2 ?F (36.8 ?C) 98.2 ?F (36.8 ?C)  ?SpO2: 100% 100%  ? ? ?Physical Exam: ?Incisions:  R AKA incision with viable skin edges, no purulence or areas of fluctuance ? ? ?CBC ?   ?Component Value Date/Time  ? WBC 20.4 (H) 08/15/2021 0137  ? RBC 2.73 (L) 08/15/2021 0137  ? HGB 7.8 (L) 08/15/2021 0137  ? HCT 26.4 (L) 08/15/2021 0137  ? PLT 358 08/15/2021 0137  ? MCV 96.7 08/15/2021 0137  ? MCH 28.6 08/15/2021 0137  ? MCHC 29.5 (L) 08/15/2021 0137  ? RDW 18.3 (H) 08/15/2021 0137  ? LYMPHSABS 1.3 08/12/2021 0238  ? MONOABS 2.1 (H) 08/12/2021 0238  ? EOSABS 0.4 08/12/2021 0238  ? BASOSABS 0.1 08/12/2021 0238  ? ? ?BMET ?   ?Component Value Date/Time  ? NA 133 (L) 08/15/2021 0137  ? K 5.0 08/15/2021 0137  ? CL 98 08/15/2021 0137  ? CO2 22 08/15/2021 0137  ? GLUCOSE 228 (H) 08/15/2021 0137  ? BUN 36 (H) 08/15/2021 0137  ? CREATININE 11.39 (H) 08/15/2021 0137  ? CALCIUM 7.2 (L) 08/15/2021 0137  ? GFRNONAA 4 (L) 08/15/2021 0137  ? GFRAA 6 (L) 11/29/2019 0120  ? ? ?INR ?   ?Component Value Date/Time  ? INR 1.3 (H) 07/10/2021 7353  ? ? ? ?Intake/Output Summary (Last 24 hours) at 08/19/2021 0747 ?Last data filed at 08/18/2021 2101 ?Gross per 24 hour  ?Intake 3 ml  ?Output --  ?Net 3 ml  ? ? ? ?Assessment/Plan:  51 y.o. female is s/p right above knee amputation  5 Days Post-Op ? ?- R AKA appears to be healing well ?- Ok for discharge from vascular standpoint ?- Office will arrange staple removal in another 5-6 weeks  ?- We will consider LUE arteriogram in the outpatient setting if the left third finger fails to heal ? ?Dagoberto Ligas, PA-C ?[facility] ?[phone] ?08/19/2021 ?7:47 AM ? ? ? ? ? ?

## 2021-08-19 NOTE — TOC Progression Note (Signed)
Transition of Care (TOC) - Progression Note  ? ? ?Patient Details  ?Name: Courtney Gray ?MRN: [id number] ?Date of Birth: 05-31-1970 ? ?Transition of Care (TOC) CM/SW Contact  ?Vinie Sill, LCSW ?Phone Number: ?08/19/2021, 3:06 PM ? ?Clinical Narrative:   ? ?[facility] [facility]- confirmed bed offer and advised patient can be admitted tomorrow.  ? ?CSW called  back to make sure they confirm outpatient dialysis schedule, at this time, SNF reports per their Regional Director they are unable to accept patient and rescinded their offer. ? ?Patient currently has no other bed offers. CSW will continue SNF search and try to secure placement. ? ?Thurmond Butts, MSW, LCSW ?Clinical Social Worker ? ? ? ? ? ?Expected Discharge Plan: [place] ?Barriers to Discharge: Continued Medical Work up, Ship broker ? ?Expected Discharge Plan and Services ?Expected Discharge Plan: [place] ?In-house Referral: Clinical Social Work ?  ?  ?  ?                ?  ?  ?  ?  ?  ?  ?  ?  ?  ?  ? ? ?Social Determinants of Health (SDOH) Interventions ?  ? ?Readmission Risk Interventions ? ?  07/12/2021  ? 12:38 PM 05/23/2021  ?  1:12 PM  ?Readmission Risk Prevention Plan  ?Transportation Screening Complete Complete  ?Medication Review Press photographer) Complete Complete  ?PCP or Specialist appointment within 3-5 days of discharge Complete Complete  ?[place] or Home Care Consult Complete Complete  ?SW Recovery Care/Counseling Consult Complete Complete  ?Palliative Care Screening Not Applicable Not Applicable  ?Skilled Nursing Facility Complete Not Applicable  ? ? ?

## 2021-08-19 NOTE — Progress Notes (Signed)
Message sent to MD : L hand, 3rd finger (middle) is swollen, painful with a healing blood blister.  She states it came from a blood sugar stick when she was at the rehab prior to admission.  She has been putting ABT ointment and Band-Aids on it but this am it was macerated so I ask her to leave it open to air.  Minimal redness present between 1st and middle phalanges and moderate swelling throughout this finger.  O2 sensor placed on finger registering 90-93 %.   She is concerned.   Mendel Ryder from Nephrology is also aware and present during exam.

## 2021-08-19 NOTE — Progress Notes (Signed)
Physical Therapy Treatment ?Patient Details ?Name: Courtney Gray ?MRN: [id number] ?DOB: 06/16/1970 ?Today's Date: 08/19/2021 ? ? ?History of Present Illness Pt is a 51 y.o. female admitted 4/29 with ischemic R foot. 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 PMH: ESRD on HD, gout, DMII, HTN. ? ?  ?PT Comments  ? ? Pt requiring supervision bed mobility with heavy use of bedrails. Pt prefers decreased hands on assist and moving at her own pace to assist with pain control. Pt performed LLE exercises supine in bed. Declining out of bed due to pain. "My leg just stopped hurting and I don't want to stir it up." Provided set up for bathing at end of session. ?   ?Recommendations for follow up therapy are one component of a multi-disciplinary discharge planning process, led by the attending physician.  Recommendations may be updated based on patient status, additional functional criteria and insurance authorization. ? ?Follow Up Recommendations ? Skilled nursing-short term rehab (<3 hours/day) ?  ?  ?Assistance Recommended at Discharge Frequent or constant Supervision/Assistance  ?Patient can return home with the following Assistance with cooking/housework;Assist for transportation;Help with stairs or ramp for entrance;Two people to help with walking and/or transfers;Two people to help with bathing/dressing/bathroom;Direct supervision/assist for medications management ?  ?Equipment Recommendations ? Hospital bed;Wheelchair (measurements PT);Other (comment) (bariatric, hoyer lift)  ?  ?Recommendations for Other Services   ? ? ?  ?Precautions / Restrictions Precautions ?Precautions: Fall;Other (comment) ?Precaution Comments: R AKA ?Restrictions ?RLE Weight Bearing: Non weight bearing  ?  ? ?Mobility ? Bed Mobility ?Overal bed mobility: Needs Assistance ?Bed Mobility: Supine to Sit ?  ?  ?Supine to sit: Supervision, HOB elevated ?Sit to supine: Supervision, HOB elevated ?  ?General bed mobility comments: Pt able to pull up on lower bed rails and  slide LLE off EOB in modified EOB sitting. ?  ? ?Transfers ?  ?  ?  ?  ?  ?  ?  ?  ?  ?General transfer comment: Pt declining due to not wanting to 'stir up' her pain. ?  ? ?Ambulation/Gait ?  ?  ?  ?  ?  ?  ?  ?  ? ? ?Stairs ?  ?  ?  ?  ?  ? ? ?Wheelchair Mobility ?  ? ?Modified Rankin (Stroke Patients Only) ?  ? ? ?  ?Balance Overall balance assessment: Needs assistance ?Sitting-balance support: No upper extremity supported, Feet unsupported ?Sitting balance-[name] Scale: Good ?  ?  ?  ?  ?  ?  ?  ?  ?  ?  ?  ?  ?  ?  ?  ?  ?  ? ?  ?Cognition Arousal/Alertness: Awake/alert ?Behavior During Therapy: Anxious ?Overall Cognitive Status: Within Functional Limits for tasks assessed ?  ?  ?  ?  ?  ?  ?  ?  ?  ?  ?  ?  ?  ?  ?  ?  ?General Comments: Anxiety and slow progressing. ?  ?  ? ?  ?Exercises General Exercises - Lower Extremity ?Ankle Circles/Pumps: Left, AROM, 10 reps, Supine ?Heel Slides: AROM, Left, 10 reps, Supine ?Hip ABduction/ADduction: AROM, Left, 10 reps, Supine ?Straight Leg Raises: AROM, Left, 5 reps, Supine ?Other Exercises ?Other Exercises: unilateral bridging LLE x 10 ?Other Exercises: Attempted hip abd/add R residual limb. Pt with ms spasm/quivering movement with all ROM attempts. Several minutes required for quivering to cease. ? ?  ?General Comments   ?  ?  ? ?Pertinent Vitals/Pain Pain  Assessment ?Pain Assessment: Faces ?Faces Pain Scale: Hurts little more ?Pain Location: R residual limb ?Pain Descriptors / Indicators: Grimacing, Guarding, Discomfort, Spasm ?Pain Intervention(s): Limited activity within patient's tolerance, Monitored during session, Repositioned  ? ? ?Home Living   ?  ?  ?  ?  ?  ?  ?  ?  ?  ?   ?  ?Prior Function    ?  ?  ?   ? ?PT Goals (current goals can now be found in the care plan section) Acute Rehab PT Goals ?Patient Stated Goal: decreased pain, increased independence ?Progress towards PT goals: Progressing toward goals (slowly) ? ?  ?Frequency ? ? ? Min 2X/week ? ? ? ?   ?PT Plan Current plan remains appropriate  ? ? ?Co-evaluation   ?  ?  ?  ?  ? ?  ?AM-PAC PT "6 Clicks" Mobility   ?Outcome Measure ? Help needed turning from your back to your side while in a flat bed without using bedrails?: A Little ?Help needed moving from lying on your back to sitting on the side of a flat bed without using bedrails?: A Lot ?Help needed moving to and from a bed to a chair (including a wheelchair)?: Total ?Help needed standing up from a chair using your arms (e.g., wheelchair or bedside chair)?: Total ?Help needed to walk in hospital room?: Total ?Help needed climbing 3-5 steps with a railing? : Total ?6 Click Score: 9 ? ?  ?End of Session   ?Activity Tolerance: Patient limited by pain;Other (comment) (anxiety limiting) ?Patient left: in bed;with call bell/phone within reach ?Nurse Communication: Mobility status ?PT Visit Diagnosis: Other abnormalities of gait and mobility (R26.89);Muscle weakness (generalized) (M62.81);Difficulty in walking, not elsewhere classified (R26.2);Pain ?Pain - Right/Left: Right ?Pain - part of body: Leg ?  ? ? ?Time: 8250-0370 ?PT Time Calculation (min) (ACUTE ONLY): 24 min ? ?Charges:  $Therapeutic Exercise: 8-22 mins ?$Therapeutic Activity: 8-22 mins          ?          ? ?Lorrin Goodell, PT  ?Office # [phone] ?Pager [phone] ? ? ? ?Courtney Gray ?08/19/2021, 10:36 AM ? ?

## 2021-08-19 NOTE — Progress Notes (Signed)
?PROGRESS NOTE ? ? ? ?Courtney Gray  SNK:[id number] DOB: 1971/02/26 DOA: 08/03/2021 ?PCP: [facility], [facility]  ? ?Brief Narrative:  ?51 y.o. female with medical history significant of end-stage renal disease on hemodialysis, gout, diabetes mellitus type II, hypertension who just discharged from rehab this past Monday and was told that she had run out of her days and that she needed to pay out-of-pocket for any further rehab.  She presented again with worsening right foot pain and there was a concern for ischemic foot.  She was admitted and transferred to [facility].  CT angiogram was inconclusive.  She underwent arteriogram on 08/07/2021.  Podiatry following. ? ?Assessment & Plan: ?  ?Ischemic right foot ?-Evidence of gangrenous changes in some of her toes of the right foot. ?-CT angiogram was inconclusive due to significant calcification. ?-Vascular surgery following: Status post arteriogram on 08/07/2021 which showed severe calcific disease in bilateral lower extremities with no endovascular or open surgical options to improve perfusion.  IV heparin subsequently discontinued by vascular surgery. ?-Podiatry also following.  Recommended to start IV Ancef.  Podiatry recommended transmet or Chopart level.  Vascular is a following.  Patient instead made a decision to proceed with right AKA since she does not want to have multiple surgery and wants to have the surgery with the best chances and vascular had recommended her the best chance would be with AKA.  She underwent AKA on 08/14/2021.  Complains of pain but appears very comfortable.  Continue current pain management.  She will likely require discharge to SNF.  [facility] has rejected her.  Appreciate vascular surgery assistance.  Vascular surgery has added Norco on top of IV Dilaudid.  Pain is now controlled.  We will discontinue IV antibiotics today. ? ?End-stage renal disease on hemodialysis ?-Nephrology following.  Dialysis as per nephrology schedule ? ?Chronic  respiratory failure with hypoxia ?-Chronically on 3 L oxygen via nasal cannula.  Respiratory status currently stable ? ?Diabetes mellitus type 2 with hypoglycemia ?-A1c 5.7. ?-Still has intermittent episodes of hypoglycemia.  Continue CBGs. ?-Glipizide and Lantus were discontinued during last hospitalization because of episodes of hypoglycemia.  On Semglee 10 units and SSI.  Blood sugar controlled. ? ?Essential hypertension ?Blood pressure labile but intermittently slightly elevated.  On Coreg.  Amlodipine on hold.  Will resume amlodipine at lower dose of 5 mg. ? ?Acute on chronic anemia of chronic kidney disease: Hemoglobin dropped from 9.9 yesterday to 7.8 today, likely postop anemia.  Monitor closely and transfuse if less than 7.  For some reason, labs were not collected yesterday and today. ? ?Hyperlipidemia ?-continue statin ? ?Hypersomnolence ?-Likely due to combination of illness and medications.  Gabapentin dose has been decreased.  Judicious use of narcotics.  Feeling much better and fully awake and alert Since 08/10/2021. ? ?History of hemorrhoids ?-Stable ? ?Questionable history of gout ?-apparently treated for gout of her right foot recently ? ?Morbid obesity ?-Outpatient follow-up ? ? ?DVT prophylaxis: Subcutaneous heparin  ?code Status: Full ?Family Communication: None at bedside ?Disposition Plan: ?Status is: Inpatient ?Remains inpatient appropriate because: Patient is medically stable.  Waiting for SNF bed placement. ? ? ?Consultants: Vascular surgery/podiatry/nephrology ? ?Procedures: Left lower extremity arteriogram on 08/07/2021. ?Right AKA ?Antimicrobials: None ? ? ?Subjective: ? ?Seen and examined.  No complaints.  Informed the patient about the fact that insurance authorization has been received but bed is not available at the facility as of today per TOC note.  She was disappointed. ? ?Objective: ?Vitals:  ?  08/18/21 2000 08/18/21 2355 08/19/21 0330 08/19/21 0810  ?BP: (!) 112/52 (!) 123/54 130/65  (!) 129/52  ?Pulse: 82 89 79 84  ?Resp: 17 17 16 19   ?Temp: 98.1 ?F (36.7 ?C) 98.2 ?F (36.8 ?C) 98.2 ?F (36.8 ?C) 98.1 ?F (36.7 ?C)  ?TempSrc: Oral Oral Oral Oral  ?SpO2: 100% 100% 100% 100%  ?Weight:   (!) 153.2 kg   ?Height:      ? ? ?Intake/Output Summary (Last 24 hours) at 08/19/2021 0959 ?Last data filed at 08/18/2021 2101 ?Gross per 24 hour  ?Intake 3 ml  ?Output --  ?Net 3 ml  ? ? ?Filed Weights  ? 08/17/21 0500 08/17/21 1009 08/19/21 0330  ?Weight: (!) 151.1 kg (!) 151 kg (!) 153.2 kg  ? ? ?Examination: ? ?General exam: Appears calm and comfortable , morbidly obese ?Respiratory system: Clear to auscultation. Respiratory effort normal. ?Cardiovascular system: S1 & S2 heard, RRR. No JVD, murmurs, rubs, gallops or clicks. No pedal edema. ?Gastrointestinal system: Abdomen is nondistended, soft and nontender. No organomegaly or masses felt. Normal bowel sounds heard. ?Central nervous system: Alert and oriented. No focal neurological deficits. ?Extremities: Right AKA ?Skin: No rashes, lesions or ulcers.  ?Psychiatry: Judgement and insight appear normal. Mood & affect appropriate.  ? ?Data Reviewed: I have personally reviewed following labs and imaging studies ? ?CBC: ?Recent Labs  ?Lab 08/13/21 ?0814 08/14/21 ?0807 08/15/21 ?0137  ?WBC 14.1*  --  20.4*  ?HGB 8.5* 9.9* 7.8*  ?HCT 29.4* 29.0* 26.4*  ?MCV 96.1  --  96.7  ?PLT PLATELET CLUMPS NOTED ON SMEAR, UNABLE TO ESTIMATE  --  358  ? ? ?Basic Metabolic Panel: ?Recent Labs  ?Lab 08/13/21 ?4818 08/14/21 ?0807 08/15/21 ?0137  ?NA 132* 134* 133*  ?K 3.9 3.6 5.0  ?CL 96* 96* 98  ?CO2 21*  --  22  ?GLUCOSE 129* 127* 228*  ?BUN 33* 26* 36*  ?CREATININE 12.02* 10.40* 11.39*  ?CALCIUM 7.5*  --  7.2*  ?PHOS 5.2*  --   --   ? ? ?GFR: ?Estimated Creatinine Clearance: 8.8 mL/min (A) (by C-G formula based on SCr of 11.39 mg/dL (H)). ?Liver Function Tests: ?Recent Labs  ?Lab 08/13/21 ?5631  ?ALBUMIN 2.7*  ? ? ?No results for input(s): LIPASE, AMYLASE in the last 168 hours. ?No  results for input(s): AMMONIA in the last 168 hours. ?Coagulation Profile: ?No results for input(s): INR, PROTIME in the last 168 hours. ?Cardiac Enzymes: ?No results for input(s): CKTOTAL, CKMB, CKMBINDEX, TROPONINI in the last 168 hours. ? ?BNP (last 3 results) ?No results for input(s): PROBNP in the last 8760 hours. ?HbA1C: ?No results for input(s): HGBA1C in the last 72 hours. ?CBG: ?Recent Labs  ?Lab 08/18/21 ?4970 08/18/21 ?1121 08/18/21 ?1632 08/18/21 ?2128 08/19/21 ?2637  ?GLUCAP 166* [place] ? ?Lipid Profile: ?No results for input(s): CHOL, HDL, LDLCALC, TRIG, CHOLHDL, LDLDIRECT in the last 72 hours. ? ?Thyroid Function Tests: ?No results for input(s): TSH, T4TOTAL, FREET4, T3FREE, THYROIDAB in the last 72 hours. ?Anemia Panel: ?Recent Labs  ?  08/17/21 ?0929  ?FERRITIN 996*  ?TIBC 141*  ?IRON 28  ? ? ?Sepsis Labs: ?No results for input(s): PROCALCITON, LATICACIDVEN in the last 168 hours. ? ?No results found for this or any previous visit (from the past 240 hour(s)).  ? ? ? ? ? ?Radiology Studies: ?No results found. ? ? ? ? ? ?Scheduled Meds: ? amLODipine  5 mg Oral Daily  ? aspirin EC  81 mg  Oral Q breakfast  ? atorvastatin  10 mg Oral QHS  ? calcitRIOL  1 mcg Oral Q T,Th,Sa-HD  ? calcium carbonate  1,500 mg Oral TID with meals  ? calcium carbonate  1.5 tablet Oral With snacks  ? carvedilol  6.25 mg Oral BID WC  ? darbepoetin (ARANESP) injection - DIALYSIS  200 mcg Intravenous Q Sat-HD  ? docusate sodium  100 mg Oral BID  ? feeding supplement (NEPRO CARB STEADY)  237 mL Oral TID WC  ? gabapentin  100 mg Oral BID  ? heparin injection (subcutaneous)  5,000 Units Subcutaneous Q8H  ? insulin aspart  0-6 Units Subcutaneous TID WC  ? insulin glargine-yfgn  10 Units Subcutaneous Daily  ? lidocaine  1 patch Transdermal Q24H  ? lisinopril  20 mg Oral Daily  ? mupirocin ointment  1 application. Topical BID  ? neomycin-bacitracin-polymyxin   Topical BID  ? nystatin   Topical TID  ? pantoprazole  40 mg  Oral Daily  ? polyethylene glycol  17 g Oral Daily  ? sodium chloride flush  10-40 mL Intracatheter Q12H  ? sodium chloride flush  3 mL Intravenous Q12H  ? ?Continuous Infusions: ? sodium chloride    ?

## 2021-08-20 ENCOUNTER — Inpatient Hospital Stay (HOSPITAL_COMMUNITY): Payer: 59

## 2021-08-20 DIAGNOSIS — I998 Other disorder of circulatory system: Secondary | ICD-10-CM | POA: Diagnosis not present

## 2021-08-20 DIAGNOSIS — M79673 Pain in unspecified foot: Secondary | ICD-10-CM | POA: Diagnosis not present

## 2021-08-20 LAB — RENAL FUNCTION PANEL
Albumin: 2.5 g/dL — ABNORMAL LOW (ref 3.5–5.0)
Anion gap: 12 (ref 5–15)
BUN: 50 mg/dL — ABNORMAL HIGH (ref 6–20)
CO2: 26 mmol/L (ref 22–32)
Calcium: 7.9 mg/dL — ABNORMAL LOW (ref 8.9–10.3)
Chloride: 98 mmol/L (ref 98–111)
Creatinine, Ser: 11.16 mg/dL — ABNORMAL HIGH (ref 0.44–1.00)
GFR, Estimated: 4 mL/min — ABNORMAL LOW (ref >60)
Glucose, Bld: 162 mg/dL — ABNORMAL HIGH (ref 70–99)
Phosphorus: 5.9 mg/dL — ABNORMAL HIGH (ref 2.5–4.6)
Potassium: 4.4 mmol/L (ref 3.5–5.1)
Sodium: 136 mmol/L (ref 135–145)

## 2021-08-20 LAB — CBC
HCT: 26.4 % — ABNORMAL LOW (ref 36.0–46.0)
Hemoglobin: 7.3 g/dL — ABNORMAL LOW (ref 12.0–15.0)
MCH: 27.2 pg (ref 26.0–34.0)
MCHC: 27.7 g/dL — ABNORMAL LOW (ref 30.0–36.0)
MCV: 98.5 fL (ref 80.0–100.0)
Platelets: 348 10*3/uL (ref 150–400)
RBC: 2.68 MIL/uL — ABNORMAL LOW (ref 3.87–5.11)
RDW: 18.6 % — ABNORMAL HIGH (ref 11.5–15.5)
WBC: 21.1 10*3/uL — ABNORMAL HIGH (ref 4.0–10.5)
nRBC: 0.1 % (ref 0.0–0.2)

## 2021-08-20 LAB — GLUCOSE, CAPILLARY
Glucose-Capillary: 172 mg/dL — ABNORMAL HIGH (ref 70–99)
Glucose-Capillary: 131 mg/dL — ABNORMAL HIGH (ref 70–99)
Glucose-Capillary: 145 mg/dL — ABNORMAL HIGH (ref 70–99)

## 2021-08-20 MED ORDER — DARBEPOETIN ALFA 200 MCG/0.4ML IJ SOSY
200.0000 ug | PREFILLED_SYRINGE | INTRAMUSCULAR | Status: DC
  Administered 2021-08-20 – 2021-08-27 (×2): 200 ug via INTRAVENOUS
  Filled 2021-08-20 (×3): qty 0.4

## 2021-08-20 NOTE — Care Management Important Message (Signed)
Important Message ? ?Patient Details  ?Name: Courtney Gray ?MRN: [id number] ?Date of Birth: [birth year] ? ? ?Medicare Important Message Given:  Yes ? ? ? ? ?Shelda Altes ?08/20/2021, 7:53 AM ?

## 2021-08-20 NOTE — Progress Notes (Addendum)
PROGRESS NOTE    Courtney Gray  BOF:[id number] DOB: 12-16-1970 DOA: 08/03/2021 PCP: [facility], [facility]   Brief Narrative:  51 y.o. female with medical history significant of end-stage renal disease on hemodialysis, gout, diabetes mellitus type II, hypertension who just discharged from rehab this past Monday and was told that she had run out of her days and that she needed to pay out-of-pocket for any further rehab.  She presented again with worsening right foot pain and there was a concern for ischemic foot.  She was admitted and transferred to [facility].  CT angiogram was inconclusive.  She underwent arteriogram on 08/07/2021.  Seen by podiatry as well as vascular surgery.  Eventually underwent right AKA.  She is hemodialysis patient.  She was accepted at the same facility and insurance authorization was initiated which was achieved on Sunday however facility has now declined her bed on Monday.  Assessment & Plan:   Ischemic right foot -Evidence of gangrenous changes in some of her toes of the right foot. -CT angiogram was inconclusive due to significant calcification. -Vascular surgery following: Status post arteriogram on 08/07/2021 which showed severe calcific disease in bilateral lower extremities with no endovascular or open surgical options to improve perfusion.  IV heparin subsequently discontinued by vascular surgery. -Podiatry also following.  Recommended to start IV Ancef.  Podiatry recommended transmet or Chopart level.  Vascular is a following.  Patient instead made a decision to proceed with right AKA since she does not want to have multiple surgery and wants to have the surgery with the best chances and vascular had recommended her the best chance would be with AKA.  She underwent AKA on 08/14/2021.  Her pain is controlled on current medications.  Ulcer left middle finger: Apparently, patient had developed this ulcer on the left middle finger even before admission this time  but she never mentioned this to anyone.  She mentioned this to the staff last night.  She has been applying cream on this and according to her, it has been healing.  On my examination, she does seem to have some necrotic skin and I am concerned that she may have osteomyelitis.  I do not see any erythema.  She is tender to palpation.  It is not warmth but it is edematous on examination.  I will proceed with x-ray of the left hand first, if that will be inconclusive, we may need to proceed with either CT or MRI.  Her leukocytosis is also getting worse but she has remained afebrile.  Picture below.     End-stage renal disease on hemodialysis -Nephrology following.  Dialysis as per nephrology schedule  Chronic respiratory failure with hypoxia -Chronically on 3 L oxygen via nasal cannula.  Respiratory status currently stable  Diabetes mellitus type 2 with hypoglycemia -A1c 5.7. -Glipizide and Lantus were discontinued during last hospitalization because of episodes of hypoglycemia.  On Semglee 10 units and SSI.  Blood sugar controlled.  Essential hypertension: Blood pressure controlled.  Continue amlodipine and Coreg.  Acute on chronic anemia of chronic kidney disease: Hemoglobin has remained fairly stable for last few days, currently 7.3.  Transfuse if less than 7.  Hyperlipidemia -continue statin  Hypersomnolence -Likely due to combination of illness and medications.  Gabapentin dose has been decreased.  Judicious use of narcotics.  Feeling much better and fully awake and alert Since 08/10/2021.  History of hemorrhoids -Stable  Questionable history of gout -apparently treated for gout of her right foot recently  Morbid  obesity: Weight loss encouraged.   DVT prophylaxis: Subcutaneous heparin  code Status: Full Family Communication: None at bedside Disposition Plan: Status is: Inpatient Remains inpatient appropriate because: Patient is medically stable.  Waiting for SNF bed  placement.   Consultants: Vascular surgery/podiatry/nephrology  Procedures: Left lower extremity arteriogram on 08/07/2021. Right AKA Antimicrobials: None   Subjective:  Seen and examined in dialysis unit.  She was upset because she was declined a bed at SNF.  Even today, she did not complain of left finger ulcer with me.  I was able to gather more information from her though.  Objective: Vitals:   08/20/21 1000 08/20/21 1030 08/20/21 1100 08/20/21 1213  BP: 137/79 (!) 155/83 (!) 144/61 (!) 152/91  Pulse: 61 74 84 (!) 106  Resp: 17 16 17 17   Temp:    98.1 F (36.7 C)  TempSrc:    Oral  SpO2:   99% 100%  Weight:      Height:        Intake/Output Summary (Last 24 hours) at 08/20/2021 1217 Last data filed at 08/19/2021 2000 Gross per 24 hour  Intake 600 ml  Output --  Net 600 ml    Filed Weights   08/17/21 1009 08/19/21 0330 08/20/21 0730  Weight: (!) 151 kg (!) 153.2 kg (!) 154.2 kg    Examination:  General exam: Appears calm and comfortable, morbidly obese. Respiratory system: Clear to auscultation. Respiratory effort normal. Cardiovascular system: S1 & S2 heard, RRR. No JVD, murmurs, rubs, gallops or clicks. No pedal edema. Gastrointestinal system: Abdomen is nondistended, soft and nontender. No organomegaly or masses felt. Normal bowel sounds heard. Central nervous system: Alert and oriented. No focal neurological deficits. Extremities: Right AKA.  Ulcer at the left middle finger. Skin: No rashes, lesions or ulcers.  Psychiatry: Judgement and insight appear normal. Mood & affect appropriate.   Data Reviewed: I have personally reviewed following labs and imaging studies  CBC: Recent Labs  Lab 08/14/21 0807 08/15/21 0137 08/20/21 0735  WBC  --  20.4* 21.1*  HGB 9.9* 7.8* 7.3*  HCT 29.0* 26.4* 26.4*  MCV  --  96.7 98.5  PLT  --  358 749    Basic Metabolic Panel: Recent Labs  Lab 08/14/21 0807 08/15/21 0137 08/20/21 0735  NA 134* 133* 136  K 3.6 5.0  4.4  CL 96* 98 98  CO2  --  22 26  GLUCOSE 127* 228* 162*  BUN 26* 36* 50*  CREATININE 10.40* 11.39* 11.16*  CALCIUM  --  7.2* 7.9*  PHOS  --   --  5.9*    GFR: Estimated Creatinine Clearance: 9 mL/min (A) (by C-G formula based on SCr of 11.16 mg/dL (H)). Liver Function Tests: Recent Labs  Lab 08/20/21 0735  ALBUMIN 2.5*    No results for input(s): LIPASE, AMYLASE in the last 168 hours. No results for input(s): AMMONIA in the last 168 hours. Coagulation Profile: No results for input(s): INR, PROTIME in the last 168 hours. Cardiac Enzymes: No results for input(s): CKTOTAL, CKMB, CKMBINDEX, TROPONINI in the last 168 hours.  BNP (last 3 results) No results for input(s): PROBNP in the last 8760 hours. HbA1C: No results for input(s): HGBA1C in the last 72 hours. CBG: Recent Labs  Lab 08/19/21 1228 08/19/21 1626 08/19/21 2136 08/20/21 0646 08/20/21 1209  GLUCAP 111* 125* 113* 172* 131*    Lipid Profile: No results for input(s): CHOL, HDL, LDLCALC, TRIG, CHOLHDL, LDLDIRECT in the last 72 hours.  Thyroid Function Tests:  No results for input(s): TSH, T4TOTAL, FREET4, T3FREE, THYROIDAB in the last 72 hours. Anemia Panel: No results for input(s): VITAMINB12, FOLATE, FERRITIN, TIBC, IRON, RETICCTPCT in the last 72 hours.  Sepsis Labs: No results for input(s): PROCALCITON, LATICACIDVEN in the last 168 hours.  No results found for this or any previous visit (from the past 240 hour(s)).       Radiology Studies: No results found.      Scheduled Meds:  amLODipine  5 mg Oral Daily   aspirin EC  81 mg Oral Q breakfast   atorvastatin  10 mg Oral QHS   calcitRIOL  1 mcg Oral Q T,Th,Sa-HD   calcium carbonate  1,500 mg Oral TID with meals   calcium carbonate  1.5 tablet Oral With snacks   carvedilol  6.25 mg Oral BID WC   darbepoetin (ARANESP) injection - DIALYSIS  200 mcg Intravenous Q Tue-HD   docusate sodium  100 mg Oral BID   feeding supplement (NEPRO CARB  STEADY)  237 mL Oral TID WC   gabapentin  100 mg Oral BID   heparin injection (subcutaneous)  5,000 Units Subcutaneous Q8H   insulin aspart  0-6 Units Subcutaneous TID WC   insulin glargine-yfgn  10 Units Subcutaneous Daily   lidocaine  1 patch Transdermal Q24H   lisinopril  20 mg Oral Daily   mupirocin ointment  1 application. Topical BID   nystatin   Topical TID   pantoprazole  40 mg Oral Daily   polyethylene glycol  17 g Oral Daily   sodium chloride flush  10-40 mL Intracatheter Q12H   sodium chloride flush  3 mL Intravenous Q12H   Continuous Infusions:  sodium chloride     ferric gluconate (FERRLECIT) IVPB 125 mg (08/20/21 1055)   magnesium sulfate bolus IVPB      Darliss Cheney, MD [facility] 08/20/2021, 12:17 PM

## 2021-08-20 NOTE — Progress Notes (Signed)
Patient refused blood sugar check this evening. Patient was educated. ?

## 2021-08-20 NOTE — TOC Progression Note (Signed)
Transition of Care (TOC) - Progression Note  ? ? ?Patient Details  ?Name: Courtney Gray ?MRN: [id number] ?Date of Birth: [birth year] ? ?Transition of Care (TOC) CM/SW Contact  ?Vinie Sill, LCSW ?Phone Number: ?08/20/2021, 3:44 PM ? ?Clinical Narrative:    ? ?CSW spoke with patient's daughter. Informed [facility] rescinded their bed offer and she would not to call SNF to discuss any concerns regarding denial. She was agreeable to CSW  sending referrals to other area SNFs for possible placement. CSW informed family to explore other care options incase patient is unable to secure SNF placement.  ? ?CSW has sent referral to [facility] for review but has expressed potential barriers, weight and dialysis needs.   ? ? ? ?Expected Discharge Plan: [place] ?Barriers to Discharge: Continued Medical Work up, Ship broker ? ?Expected Discharge Plan and Services ?Expected Discharge Plan: [place] ?In-house Referral: Clinical Social Work ?  ?  ?  ?                ?  ?  ?  ?  ?  ?  ?  ?  ?  ?  ? ? ?Social Determinants of Health (SDOH) Interventions ?  ? ?Readmission Risk Interventions ? ?  07/12/2021  ? 12:38 PM 05/23/2021  ?  1:12 PM  ?Readmission Risk Prevention Plan  ?Transportation Screening Complete Complete  ?Medication Review Press photographer) Complete Complete  ?PCP or Specialist appointment within 3-5 days of discharge Complete Complete  ?[place] or Home Care Consult Complete Complete  ?SW Recovery Care/Counseling Consult Complete Complete  ?Palliative Care Screening Not Applicable Not Applicable  ?Skilled Nursing Facility Complete Not Applicable  ? ? ?

## 2021-08-20 NOTE — Progress Notes (Signed)
?[facility] ?Progress Note  ? ?Subjective:   Patient seen and examined at bedside during dialysis.  Aggravated she is being woken several time through the night then brought to dialysis early in the AM.  Dialysis going well so far.  Denies CP, SOB, abdominal pain and n/v/d.  ? ?Objective ?Vitals:  ? 08/20/21 0734 08/20/21 0800 08/20/21 0830 08/20/21 0900  ?BP: (!) 162/70 (!) 153/72 (!) 162/68 (!) 156/71  ?Pulse: 78 78 78 77  ?Resp: 16 14 16 14   ?Temp:      ?TempSrc:      ?SpO2:      ?Weight:      ?Height:      ? ?Physical Exam ?General:chronically ill appearing, obese female in NAD ?Heart:RRR, no mrg ?Lungs:CTAB, nml WOB on 2L via [place] ?Abdomen:soft, NTND ?Extremities:no LE edema, R BKA, L middle finger dressed ?Dialysis Access: R IJ TDC in use  ? ?Filed Weights  ? 08/17/21 1009 08/19/21 0330 08/20/21 0730  ?Weight: (!) 151 kg (!) 153.2 kg (!) 154.2 kg  ? ? ?Intake/Output Summary (Last 24 hours) at 08/20/2021 1029 ?Last data filed at 08/19/2021 2000 ?Gross per 24 hour  ?Intake 600 ml  ?Output --  ?Net 600 ml  ? ? ?Additional Objective ?Labs: ?Basic Metabolic Panel: ?Recent Labs  ?Lab 08/14/21 ?0807 08/15/21 ?3790 08/20/21 ?2409  ?NA 134* 133* 136  ?K 3.6 5.0 4.4  ?CL 96* 98 98  ?CO2  --  22 26  ?GLUCOSE 127* 228* 162*  ?BUN 26* 36* 50*  ?CREATININE 10.40* 11.39* 11.16*  ?CALCIUM  --  7.2* 7.9*  ?PHOS  --   --  5.9*  ? ?Liver Function Tests: ?Recent Labs  ?Lab 08/20/21 ?7353  ?ALBUMIN 2.5*  ? ?CBC: ?Recent Labs  ?Lab 08/14/21 ?0807 08/15/21 ?2992 08/20/21 ?4268  ?WBC  --  20.4* 21.1*  ?HGB 9.9* 7.8* 7.3*  ?HCT 29.0* 26.4* 26.4*  ?MCV  --  96.7 98.5  ?PLT  --  358 348  ? ?CBG: ?Recent Labs  ?Lab 08/19/21 ?3419 08/19/21 ?1228 08/19/21 ?1626 08/19/21 ?2136 08/20/21 ?6222  ?GLUCAP 133* 111* 125* 113* 172*  ? ?Medications: ? sodium chloride    ? sodium chloride    ? sodium chloride    ? ferric gluconate (FERRLECIT) IVPB    ? magnesium sulfate bolus IVPB    ? ? amLODipine  5 mg Oral Daily  ? aspirin EC  81 mg  Oral Q breakfast  ? atorvastatin  10 mg Oral QHS  ? calcitRIOL  1 mcg Oral Q T,Th,Sa-HD  ? calcium carbonate  1,500 mg Oral TID with meals  ? calcium carbonate  1.5 tablet Oral With snacks  ? carvedilol  6.25 mg Oral BID WC  ? darbepoetin (ARANESP) injection - DIALYSIS  200 mcg Intravenous Q Sat-HD  ? docusate sodium  100 mg Oral BID  ? feeding supplement (NEPRO CARB STEADY)  237 mL Oral TID WC  ? gabapentin  100 mg Oral BID  ? heparin injection (subcutaneous)  5,000 Units Subcutaneous Q8H  ? insulin aspart  0-6 Units Subcutaneous TID WC  ? insulin glargine-yfgn  10 Units Subcutaneous Daily  ? lidocaine  1 patch Transdermal Q24H  ? lisinopril  20 mg Oral Daily  ? mupirocin ointment  1 application. Topical BID  ? neomycin-bacitracin-polymyxin   Topical BID  ? nystatin   Topical TID  ? pantoprazole  40 mg Oral Daily  ? polyethylene glycol  17 g Oral Daily  ? sodium chloride  flush  10-40 mL Intracatheter Q12H  ? sodium chloride flush  3 mL Intravenous Q12H  ? ? ?Dialysis Orders: ?[name] ? 4h 73min  165kg  1K/2.5Ca bath  TDC  400 BFR ? - 4/29 > HBsAg neg w/ Abs < 10 ? - mircera 200ug q2 ? - calcitriol 1 ug tiw ? - sensipar 30 mg tiw ?  ?Assessment/Plan: ?Bilateral foot pain - w/ ischemic changes R foot+ gangrenous toes x VVS consulted. S/p Bilat LE angiogram 5/3 per Dr. Virl Cagey note - severe calcific disease bilaterally.  Underwent AKA on 5/10 ?Pain in R middle finger non healing ulcer w/surrounding erythema/edema - nurse notified PMD.  ?ESRD - on HD TTS. HD today per regular schedule.  ?Volume - euvolemic, has been tolerated UF 3L with HD.  Will need new EDW on d/c. ~151kg.  ?HTN - Blood pressure in goal. Home meds per pmd as needed. Currently on amlodipine 5mg  qd, carvedilol 6.25mg  BID, and lisinopril 20mg  qd.   ?Anemia ckd - Hgb 7.3 post op. Redose ESA with HD tomorrow, not given on Saturday.  Iron sat 20% - give IV iron with hD.  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 Sensipar on hold. Cont vdra w/ HD tiw. On tums as  binder. ?Chronic hypoxic respiratory failure - on home oxygen  ?Nutrition- Renal diet w/fluid restrictions.  Alb 2.5. Ordered Nepro supplement but gives diarrhea so not drinking.  Daughter can bring protein bar. ?Dispo - SNF, waiting for bed placement ? ?Jen Mow, PA-C ?[facility] ?08/20/2021,10:29 AM ? LOS: 16 days  ? ? ?

## 2021-08-20 NOTE — Progress Notes (Signed)
Pt concerned with left hand middle finger. Bactroban applied. ?Pt has mild serosanguineous drainage of right leg stump.Pt will not allow staff to removed chuck pad with drainage. ? ?Daymon Larsen, RN  ?

## 2021-08-21 DIAGNOSIS — I998 Other disorder of circulatory system: Secondary | ICD-10-CM | POA: Diagnosis not present

## 2021-08-21 DIAGNOSIS — M79673 Pain in unspecified foot: Secondary | ICD-10-CM | POA: Diagnosis not present

## 2021-08-21 LAB — GLUCOSE, CAPILLARY
Glucose-Capillary: 95 mg/dL (ref 70–99)
Glucose-Capillary: 118 mg/dL — ABNORMAL HIGH (ref 70–99)
Glucose-Capillary: 171 mg/dL — ABNORMAL HIGH (ref 70–99)
Glucose-Capillary: 119 mg/dL — ABNORMAL HIGH (ref 70–99)

## 2021-08-21 NOTE — TOC Progression Note (Signed)
Transition of Care (TOC) - Progression Note  ? ? ?Patient Details  ?Name: Courtney Gray ?MRN: [id number] ?Date of Birth: 1970/08/26 ? ?Transition of Care (TOC) CM/SW Contact  ?Vinie Sill, LCSW ?Phone Number: ?08/21/2021, 4:24 PM ? ?Clinical Narrative:    ? ?CSW met with patient and ger daughter. CSW introduced self and explained role. CSW has been in communication with daughter, but explained to the patient of no bed offers. CSW explained bariatric needs and dialysis remains are barriers to placement. CSW also explained she may be in co-pay status. She states understanding. CSW encourage family to explore other care options because not sure of the probability of getting placement at SNF.  ? ?Patient expressed concerns about getting  in and out of the home ( she is dialysis patient) although their is only two steps to get in and out of the home. She reports, her landlord is aware and has agreed to get ramp but has not done so as of yet. He daughter states she will follow back up with her landlord. CSW provide family with [employer] contact number to inquire about the cost of renting a ramp.  ? ?Patient uses [facility] transport services for dialysis. Patient reports she will need hospital bed and prefers [facility] agency, if discharges home.  ? ?RNCM updated ? ?TOC will continue to follow and assist with discharge planning. ? ?Thurmond Butts, MSW, LCSW ?Clinical Social Worker ? ? ? ?Expected Discharge Plan: [place] ?Barriers to Discharge: Ship broker, SNF Pending bed offer (bariatric needs & dialysis needs) ? ?Expected Discharge Plan and Services ?Expected Discharge Plan: [place] ?In-house Referral: Clinical Social Work ?  ?  ?  ?                ?  ?  ?  ?  ?  ?  ?  ?  ?  ?  ? ? ?Social Determinants of Health (SDOH) Interventions ?  ? ?Readmission Risk Interventions ? ?  07/12/2021  ? 12:38 PM 05/23/2021  ?  1:12 PM  ?Readmission Risk Prevention Plan  ?Transportation  Screening Complete Complete  ?Medication Review Press photographer) Complete Complete  ?PCP or Specialist appointment within 3-5 days of discharge Complete Complete  ?[place] or Home Care Consult Complete Complete  ?SW Recovery Care/Counseling Consult Complete Complete  ?Palliative Care Screening Not Applicable Not Applicable  ?Skilled Nursing Facility Complete Not Applicable  ? ? ?

## 2021-08-21 NOTE — Plan of Care (Signed)
  Problem: Health Behavior/Discharge Planning: Goal: Ability to manage health-related needs will improve Outcome: Progressing   

## 2021-08-21 NOTE — Progress Notes (Signed)
Orthopedic Tech Progress Note ?Patient Details:  ?Courtney Gray ?September 26, 1970 ?[id number] ?Patient was not able to have an actual finger splint applied due it clamping down onto her wound. Instead I applied a xeroform wrap with coban to provide padding over the wound. ?Ortho Devices ?Type of Ortho Device: Ace wrap ?Ortho Device/Splint Location: Left middle digit ?Ortho Device/Splint Interventions: Application ?  ?Post Interventions ?Patient Tolerated: Well ? ?Jaicion Laurie E Shonte Beutler ?08/21/2021, 11:57 AM ? ?

## 2021-08-21 NOTE — Progress Notes (Signed)
?PROGRESS NOTE ? ? ? ?Courtney Gray  NOI:[id number] DOB: November 13, 1970 DOA: 08/03/2021 ?PCP: Medicine, [facility]  ? ?Brief Narrative:  ?51 y.o. female with medical history significant of end-stage renal disease on hemodialysis, gout, diabetes mellitus type II, hypertension who just discharged from rehab this past Monday and was told that she had run out of her days and that she needed to pay out-of-pocket for any further rehab.  She presented again with worsening right foot pain and there was a concern for ischemic foot.  She was admitted and transferred to [facility].  CT angiogram was inconclusive.  She underwent arteriogram on 08/07/2021.  Seen by podiatry as well as vascular surgery.  Eventually underwent right AKA.  She is hemodialysis patient.   ? ? ?Assessment & Plan: ?  ?Ischemic right foot ?-underwent AKA on 08/14/2021.  Her pain is controlled on current medications. ? ?Ulcer left middle finger:  ?-Apparently, patient had developed this ulcer on the left middle finger even before admission this time but she never mentioned this to anyone.   ?-She has been applying cream on this and according to her, it has been healing.  ?-x ray x 2 does not show osteomyelitis ?-per vascular:  discontinue ointment; start betadine paint every shift; plan was to allow patient to recover from R AKA prior to considering L arm angiography as an outpatient ? ?End-stage renal disease on hemodialysis ?-Nephrology following.  Dialysis as per nephrology schedule ? ?Chronic respiratory failure with hypoxia ?-Chronically on 3 L oxygen via nasal cannula.  Respiratory status currently stable ? ?Diabetes mellitus type 2 with hypoglycemia ?-A1c 5.7. ?-Glipizide and Lantus were discontinued during last hospitalization because of episodes of hypoglycemia.  On Semglee 10 units and SSI.  Blood sugar controlled. ? ?Essential hypertension:  ?-Continue amlodipine and Coreg. ? ?Acute on chronic anemia of chronic kidney disease: Hemoglobin  has remained fairly stable for last few days, currently 7.3.  Transfuse if less than 7. ? ?Hyperlipidemia ?-continue statin ? ?Hypersomnolence ?-Likely due to combination of illness and medications.  Gabapentin dose has been decreased.  Judicious use of narcotics.  Feeling much better and fully awake and alert Since 08/10/2021. ? ?History of hemorrhoids ?-Stable ? ?Questionable history of gout ?-apparently treated for gout of her right foot recently ? ?Morbid obesity:  ?Estimated body mass index is 55.47 kg/m? as calculated from the following: ?  Height as of this encounter: 5\' 5"  (1.651 m). ?  Weight as of this encounter: 151.2 kg.  ? ? ?DVT prophylaxis: Subcutaneous heparin  ?code Status: Full ?Family Communication: None at bedside ?Disposition Plan: ?Status is: Inpatient ?Remains inpatient appropriate because:   Waiting for SNF bed placement. ? ? ?Consultants: Vascular surgery/podiatry/nephrology ? ?Procedures: Left lower extremity arteriogram on 08/07/2021. ?Right AKA ? ? ? ?Subjective: ? ?No CP, no SOB ? ?Objective: ?Vitals:  ? 08/20/21 1942 08/21/21 0007 08/21/21 6945 08/21/21 0740  ?BP: (!) 137/92 135/86 121/88 (!) 144/87  ?Pulse: 94 88 92 88  ?Resp: 20 20 16 18   ?Temp: 98.3 ?F (36.8 ?C) 98.4 ?F (36.9 ?C) 98.1 ?F (36.7 ?C) 98.3 ?F (36.8 ?C)  ?TempSrc: Oral Oral Oral Oral  ?SpO2: 91% 98% 93% 98%  ?Weight:      ?Height:      ? ? ?Intake/Output Summary (Last 24 hours) at 08/21/2021 1215 ?Last data filed at 08/20/2021 2000 ?Gross per 24 hour  ?Intake 590 ml  ?Output 0 ml  ?Net 590 ml  ? ?Filed Weights  ? 08/19/21 0330  08/20/21 0730 08/20/21 1126  ?Weight: (!) 153.2 kg (!) 154.2 kg (!) 151.2 kg  ? ? ?Examination: ? ? ?General: Appearance:    Severely obese female in no acute distress  ?   ?Lungs:      respirations unlabored  ?Heart:    Normal heart rate.   ?MS:   Right above knee amputation noted.   ?Neurologic:   Awake, alert, oriented x 3. No apparent focal neurological           defect.   ?  ? ?Data Reviewed: I have  personally reviewed following labs and imaging studies ? ?CBC: ?Recent Labs  ?Lab 08/15/21 ?4401 08/20/21 ?0272  ?WBC 20.4* 21.1*  ?HGB 7.8* 7.3*  ?HCT 26.4* 26.4*  ?MCV 96.7 98.5  ?PLT 358 348  ? ?Basic Metabolic Panel: ?Recent Labs  ?Lab 08/15/21 ?5366 08/20/21 ?4403  ?NA 133* 136  ?K 5.0 4.4  ?CL 98 98  ?CO2 22 26  ?GLUCOSE 228* 162*  ?BUN 36* 50*  ?CREATININE 11.39* 11.16*  ?CALCIUM 7.2* 7.9*  ?PHOS  --  5.9*  ? ?GFR: ?Estimated Creatinine Clearance: 8.9 mL/min (A) (by C-G formula based on SCr of 11.16 mg/dL (H)). ?Liver Function Tests: ?Recent Labs  ?Lab 08/20/21 ?4742  ?ALBUMIN 2.5*  ? ?No results for input(s): LIPASE, AMYLASE in the last 168 hours. ?No results for input(s): AMMONIA in the last 168 hours. ?Coagulation Profile: ?No results for input(s): INR, PROTIME in the last 168 hours. ?Cardiac Enzymes: ?No results for input(s): CKTOTAL, CKMB, CKMBINDEX, TROPONINI in the last 168 hours. ? ?BNP (last 3 results) ?No results for input(s): PROBNP in the last 8760 hours. ?HbA1C: ?No results for input(s): HGBA1C in the last 72 hours. ?CBG: ?Recent Labs  ?Lab 08/20/21 ?0646 08/20/21 ?1209 08/20/21 ?1600 08/21/21 ?5956 08/21/21 ?1210  ?GLUCAP 172* 131* 145* 95 118*  ? ?Lipid Profile: ?No results for input(s): CHOL, HDL, LDLCALC, TRIG, CHOLHDL, LDLDIRECT in the last 72 hours. ? ?Thyroid Function Tests: ?No results for input(s): TSH, T4TOTAL, FREET4, T3FREE, THYROIDAB in the last 72 hours. ?Anemia Panel: ?No results for input(s): VITAMINB12, FOLATE, FERRITIN, TIBC, IRON, RETICCTPCT in the last 72 hours. ? ?Sepsis Labs: ?No results for input(s): PROCALCITON, LATICACIDVEN in the last 168 hours. ? ?No results found for this or any previous visit (from the past 240 hour(s)).  ? ? ? ? ? ?Radiology Studies: ?DG Hand 2 View Left ? ?Result Date: 08/20/2021 ?CLINICAL DATA:  Lesion post needle stick EXAM: LEFT HAND - 2 VIEW COMPARISON:  08/03/2021 FINDINGS: There is no evidence of fracture or dislocation. There is no evidence of  arthropathy or other focal bone abnormality. Extensive vascular calcifications. No subcutaneous gas or radiodense foreign body. IMPRESSION: No acute findings Electronically Signed   By: Lucrezia Europe M.D.   On: 08/20/2021 13:04   ? ? ? ? ? ?Scheduled Meds: ? amLODipine  5 mg Oral Daily  ? aspirin EC  81 mg Oral Q breakfast  ? atorvastatin  10 mg Oral QHS  ? calcitRIOL  1 mcg Oral Q T,Th,Sa-HD  ? calcium carbonate  1,500 mg Oral TID with meals  ? calcium carbonate  1.5 tablet Oral With snacks  ? carvedilol  6.25 mg Oral BID WC  ? darbepoetin (ARANESP) injection - DIALYSIS  200 mcg Intravenous Q Tue-HD  ? docusate sodium  100 mg Oral BID  ? feeding supplement (NEPRO CARB STEADY)  237 mL Oral TID WC  ? gabapentin  100 mg Oral BID  ? heparin  injection (subcutaneous)  5,000 Units Subcutaneous Q8H  ? insulin aspart  0-6 Units Subcutaneous TID WC  ? insulin glargine-yfgn  10 Units Subcutaneous Daily  ? lidocaine  1 patch Transdermal Q24H  ? lisinopril  20 mg Oral Daily  ? mupirocin ointment  1 application. Topical BID  ? nystatin   Topical TID  ? pantoprazole  40 mg Oral Daily  ? polyethylene glycol  17 g Oral Daily  ? sodium chloride flush  10-40 mL Intracatheter Q12H  ? sodium chloride flush  3 mL Intravenous Q12H  ? ?Continuous Infusions: ? sodium chloride    ? ferric gluconate (FERRLECIT) IVPB 125 mg (08/20/21 1055)  ? magnesium sulfate bolus IVPB    ? ? ?Geradine Girt, DO ?[facility] ?08/21/2021, 12:15 PM  ? ?

## 2021-08-21 NOTE — Progress Notes (Signed)
?[facility] ?Progress Note  ? ?Subjective:   Patient seen and examined at bedside.  Pain in finger a little better.  Denies CP, SOB, abdominal pain and n/v/d.   ? ?Objective ?Vitals:  ? 08/20/21 1942 08/21/21 0007 08/21/21 1610 08/21/21 0740  ?BP: (!) 137/92 135/86 121/88 (!) 144/87  ?Pulse: 94 88 92 88  ?Resp: 20 20 16 18   ?Temp: 98.3 ?F (36.8 ?C) 98.4 ?F (36.9 ?C) 98.1 ?F (36.7 ?C) 98.3 ?F (36.8 ?C)  ?TempSrc: Oral Oral Oral Oral  ?SpO2: 91% 98% 93% 98%  ?Weight:      ?Height:      ? ?Physical Exam ?General:chronically ill appearing, obese female in NAD ?Heart:RRR, no mrg ?Lungs:CTAB, nml WOB on 2L via [place] ?Abdomen:soft, NTND ?Extremities:no LE edema, R AKA, L middle finger has poorly healing ulcer with darkened tissue at center ?Dialysis Access: R IJ TDC in use  ? ?Filed Weights  ? 08/19/21 0330 08/20/21 0730 08/20/21 1126  ?Weight: (!) 153.2 kg (!) 154.2 kg (!) 151.2 kg  ? ? ?Intake/Output Summary (Last 24 hours) at 08/21/2021 1212 ?Last data filed at 08/20/2021 2000 ?Gross per 24 hour  ?Intake 590 ml  ?Output 0 ml  ?Net 590 ml  ? ? ?Additional Objective ?Labs: ?Basic Metabolic Panel: ?Recent Labs  ?Lab 08/15/21 ?9604 08/20/21 ?5409  ?NA 133* 136  ?K 5.0 4.4  ?CL 98 98  ?CO2 22 26  ?GLUCOSE 228* 162*  ?BUN 36* 50*  ?CREATININE 11.39* 11.16*  ?CALCIUM 7.2* 7.9*  ?PHOS  --  5.9*  ? ?Liver Function Tests: ?Recent Labs  ?Lab 08/20/21 ?8119  ?ALBUMIN 2.5*  ? ?CBC: ?Recent Labs  ?Lab 08/15/21 ?1478 08/20/21 ?2956  ?WBC 20.4* 21.1*  ?HGB 7.8* 7.3*  ?HCT 26.4* 26.4*  ?MCV 96.7 98.5  ?PLT 358 348  ? ?Studies/Results: ?DG Hand 2 View Left ? ?Result Date: 08/20/2021 ?CLINICAL DATA:  Lesion post needle stick EXAM: LEFT HAND - 2 VIEW COMPARISON:  08/03/2021 FINDINGS: There is no evidence of fracture or dislocation. There is no evidence of arthropathy or other focal bone abnormality. Extensive vascular calcifications. No subcutaneous gas or radiodense foreign body. IMPRESSION: No acute findings Electronically  Signed   By: Lucrezia Europe M.D.   On: 08/20/2021 13:04   ? ?Medications: ? sodium chloride    ? ferric gluconate (FERRLECIT) IVPB 125 mg (08/20/21 1055)  ? magnesium sulfate bolus IVPB    ? ? amLODipine  5 mg Oral Daily  ? aspirin EC  81 mg Oral Q breakfast  ? atorvastatin  10 mg Oral QHS  ? calcitRIOL  1 mcg Oral Q T,Th,Sa-HD  ? calcium carbonate  1,500 mg Oral TID with meals  ? calcium carbonate  1.5 tablet Oral With snacks  ? carvedilol  6.25 mg Oral BID WC  ? darbepoetin (ARANESP) injection - DIALYSIS  200 mcg Intravenous Q Tue-HD  ? docusate sodium  100 mg Oral BID  ? feeding supplement (NEPRO CARB STEADY)  237 mL Oral TID WC  ? gabapentin  100 mg Oral BID  ? heparin injection (subcutaneous)  5,000 Units Subcutaneous Q8H  ? insulin aspart  0-6 Units Subcutaneous TID WC  ? insulin glargine-yfgn  10 Units Subcutaneous Daily  ? lidocaine  1 patch Transdermal Q24H  ? lisinopril  20 mg Oral Daily  ? mupirocin ointment  1 application. Topical BID  ? nystatin   Topical TID  ? pantoprazole  40 mg Oral Daily  ? polyethylene glycol  17 g  Oral Daily  ? sodium chloride flush  10-40 mL Intracatheter Q12H  ? sodium chloride flush  3 mL Intravenous Q12H  ? ? ?Dialysis Orders: ?[name] ? 4h 51min  165kg  1K/2.5Ca bath  TDC  400 BFR ? - 4/29 > HBsAg neg w/ Abs < 10 ? - mircera 200ug q2 ? - calcitriol 1 ug tiw ? - sensipar 30 mg tiw ?  ?Assessment/Plan: ?Bilateral foot pain - w/ ischemic changes R foot+ gangrenous toes x VVS consulted. S/p Bilat LE angiogram 5/3 per Dr. Virl Cagey note - severe calcific disease bilaterally.  Underwent AKA on 5/10 ?Pain in R middle finger non healing ulcer w/surrounding erythema/edema.  Xray with no acute findings.  VVS recommends betadine wash every shift. Possible angiography as outpatient of L arm once patient recovered from R AKA.   ?ESRD - on HD TTS. HD tomorrow per regular schedule, 2nd shift if possible ?Volume - mostly euvolemic, has been tolerated UF 3L with HD.  Will need new EDW on  d/c. ~151kg.  ?HTN - Blood pressure well controlled. Home meds per pmd as needed. Currently on amlodipine 5mg  qd, carvedilol 6.25mg  BID, and lisinopril 20mg  qd.   ?Anemia ckd - last Hgb 7.3 post op. Redose ESA with HD yesterday, not given on Saturday.  Iron sat 20% - give IV iron with hD.  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 Sensipar on hold d/y hypocalcemia. Cont vdra w/ HD tiw. On tums as binder. ?Chronic hypoxic respiratory failure - on home oxygen  ?Nutrition- Renal diet w/fluid restrictions.  Alb 2.5. Ordered Nepro supplement but gives diarrhea so not drinking.  Daughter can bring protein bar. ?Dispo - SNF, waiting for bed placement ? ?Jen Mow, PA-C ?[facility] ?08/21/2021,12:12 PM ? LOS: 17 days  ? ? ?

## 2021-08-21 NOTE — TOC Progression Note (Signed)
Transition of Care (TOC) - Progression Note  ? ? ?Patient Details  ?Name: Courtney Gray ?MRN: [id number] ?Date of Birth: [birth year] ? ?Transition of Care (TOC) CM/SW Contact  ?Vinie Sill, LCSW ?Phone Number: ?08/21/2021, 8:55 AM ? ?Clinical Narrative:    ? ?Patient has no bed offers at this time. Daughter is agreeable to sending out referrals in the [place] area. [name] is reviewing - waiting their decision. Sent referral to [facility] and [facility] to review. ? ?TOC will continue to follow and assist with discharge planning. ? ?Thurmond Butts, MSW, LCSW ?Clinical Social Worker ? ? ? ?Expected Discharge Plan: [place] ?Barriers to Discharge: Ship broker, SNF Pending bed offer (bariatric needs & dialysis needs) ? ?Expected Discharge Plan and Services ?Expected Discharge Plan: [place] ?In-house Referral: Clinical Social Work ?  ?  ?  ?                ?  ?  ?  ?  ?  ?  ?  ?  ?  ?  ? ? ?Social Determinants of Health (SDOH) Interventions ?  ? ?Readmission Risk Interventions ? ?  07/12/2021  ? 12:38 PM 05/23/2021  ?  1:12 PM  ?Readmission Risk Prevention Plan  ?Transportation Screening Complete Complete  ?Medication Review Press photographer) Complete Complete  ?PCP or Specialist appointment within 3-5 days of discharge Complete Complete  ?[place] or Home Care Consult Complete Complete  ?SW Recovery Care/Counseling Consult Complete Complete  ?Palliative Care Screening Not Applicable Not Applicable  ?Skilled Nursing Facility Complete Not Applicable  ? ? ?

## 2021-08-21 NOTE — Progress Notes (Signed)
?  Progress Note ? ? ? ?08/21/2021 ?8:58 AM ?7 Days Post-Op ? ?Subjective:  L third finger pain ? ? ?Vitals:  ? 08/21/21 0651 08/21/21 0740  ?BP: 121/88 (!) 144/87  ?Pulse: 92 88  ?Resp: 16 18  ?Temp: 98.1 ?F (36.7 ?C) 98.3 ?F (36.8 ?C)  ?SpO2: 93% 98%  ? ?Physical Exam: ?Lungs:  non labored ?Incision: R AKA with some drainage, no purulence ?Extremities:  L foot warm with no non healing wounds; L thrid finger macerated ?Neurologic: A&O ? ?CBC ?   ?Component Value Date/Time  ? WBC 21.1 (H) 08/20/2021 0735  ? RBC 2.68 (L) 08/20/2021 0735  ? HGB 7.3 (L) 08/20/2021 0735  ? HCT 26.4 (L) 08/20/2021 0735  ? PLT 348 08/20/2021 0735  ? MCV 98.5 08/20/2021 0735  ? MCH 27.2 08/20/2021 0735  ? MCHC 27.7 (L) 08/20/2021 0735  ? RDW 18.6 (H) 08/20/2021 0735  ? LYMPHSABS 1.3 08/12/2021 0238  ? MONOABS 2.1 (H) 08/12/2021 0238  ? EOSABS 0.4 08/12/2021 0238  ? BASOSABS 0.1 08/12/2021 0238  ? ? ?BMET ?   ?Component Value Date/Time  ? NA 136 08/20/2021 0735  ? K 4.4 08/20/2021 0735  ? CL 98 08/20/2021 0735  ? CO2 26 08/20/2021 0735  ? GLUCOSE 162 (H) 08/20/2021 0735  ? BUN 50 (H) 08/20/2021 0735  ? CREATININE 11.16 (H) 08/20/2021 0735  ? CALCIUM 7.9 (L) 08/20/2021 0735  ? GFRNONAA 4 (L) 08/20/2021 0735  ? GFRAA 6 (L) 11/29/2019 0120  ? ? ?INR ?   ?Component Value Date/Time  ? INR 1.3 (H) 07/10/2021 0973  ? ? ? ?Intake/Output Summary (Last 24 hours) at 08/21/2021 0858 ?Last data filed at 08/20/2021 2000 ?Gross per 24 hour  ?Intake 830 ml  ?Output 3000 ml  ?Net -2170 ml  ? ? ? ?Assessment/Plan:  51 y.o. Gray is s/p R AKA; L third finger wound 7 Days Post-Op  ? ?-R AKA with some drainage; will write for dry dressing changes ever shift ?-L third finger wound; discontinue ointment; start betadine paint every shift; plan was to allow patient to recover from R AKA prior to considering L arm angiography as an outpatient;  will discuss with Dr. Carlis Abbott ?-L foot without rest pain or tissue changes.  Educated patient on tibial disease in LLE as well  however will not intervene unless she developed rest pain or tissue loss ? ? ?Dagoberto Ligas, PA-C ?[facility] ?[phone] ?08/21/2021 ?8:58 AM ? ? ? ?

## 2021-08-22 DIAGNOSIS — I998 Other disorder of circulatory system: Secondary | ICD-10-CM | POA: Diagnosis not present

## 2021-08-22 DIAGNOSIS — M79673 Pain in unspecified foot: Secondary | ICD-10-CM | POA: Diagnosis not present

## 2021-08-22 LAB — GLUCOSE, CAPILLARY
Glucose-Capillary: 117 mg/dL — ABNORMAL HIGH (ref 70–99)
Glucose-Capillary: 95 mg/dL (ref 70–99)
Glucose-Capillary: 135 mg/dL — ABNORMAL HIGH (ref 70–99)
Glucose-Capillary: 208 mg/dL — ABNORMAL HIGH (ref 70–99)

## 2021-08-22 NOTE — Progress Notes (Signed)
Patient refused morning labs. Patient stated she wanted to wait to get her labs drawn at dialysis.

## 2021-08-22 NOTE — Progress Notes (Signed)
Pt is refusing CT scan of her middle finger on left hand due to not being able to transfer from bed to CT table. Pt educated on transferring process and still refuses scan states it is "too much in one day." MD notified.  Daymon Larsen, RN

## 2021-08-22 NOTE — Progress Notes (Signed)
Physical Therapy Treatment Patient Details Name: Courtney Gray MRN: [id number] DOB: 1970-11-06 Today's Date: 08/22/2021   History of Present Illness Pt is a 51 y.o. female admitted 4/29 with ischemic R foot. 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 PMH: ESRD on HD, gout, DMII, HTN.    PT Comments    Pt received in supine, agreeable to therapy session with encouragement, with fair participation and tolerance for bed-level/sidelying exercises and transition to EOB sitting. Pt performed bed mobility with increased time due to anxiety/pain and mod cues for technique, needing up to minA for rolling and scooting to EOB, pt Supervision for supine<>long sitting prior to rotating hips. Pt refusing to attempt seated scooting/OOB transfers at end of session but agreeable to sit up EOB and eat lunch, encouraged her to sit up at least 1 hour. RN/mobility specialist notified she will need assist for safe return to supine in ~1hr. Bed pad under her appearing dirty (crumbs on it) but pt refusing to change this at time of session. Encouraged rolling Q2H for pressure relief while in bed vs transition to sitting EOB/sidelying more frequently. Plan to initiate hoyer OOB to chair vs seated scooting next session to prepare pt for sitting upright in chair at future HD appointments. Pt continues to benefit from PT services to progress toward functional mobility goals.   Recommendations for follow up therapy are one component of a multi-disciplinary discharge planning process, led by the attending physician.  Recommendations may be updated based on patient status, additional functional criteria and insurance authorization.  Follow Up Recommendations  Skilled nursing-short term rehab (<3 hours/day)     Assistance Recommended at Discharge Frequent or constant Supervision/Assistance  Patient can return home with the following Assistance with cooking/housework;Assist for transportation;Help with stairs or ramp for entrance;Two people to  help with walking and/or transfers;Two people to help with bathing/dressing/bathroom;Direct supervision/assist for medications management   Equipment Recommendations  Hospital bed;Wheelchair (measurements PT);Other (comment) (all bariatric equipment; mechanical lift, slide board)    Recommendations for Other Services       Precautions / Restrictions Precautions Precautions: Fall;Other (comment) Precaution Comments: R AKA, self-limiting, L finger ulcer Restrictions Weight Bearing Restrictions: Yes RLE Weight Bearing: Non weight bearing     Mobility  Bed Mobility Overal bed mobility: Needs Assistance Bed Mobility: Rolling, Supine to Sit Rolling: Min assist     Sit to supine: Min assist   General bed mobility comments: Supervision for long sitting in bed but needs minA for scooting anterior and rotating hips to EOB; minA for rolling fully onto L side.    Transfers Overall transfer level: Needs assistance                 General transfer comment: Pt declining due to not wanting to 'stir up' her pain, verbal discussion on need to progress to scoot transfers, especially given current deficits and difficulty finding SNF placement. May need hoyer OOB next session if pt unable to initiate scooting.    Ambulation/Gait                   Stairs             Wheelchair Mobility    Modified Rankin (Stroke Patients Only)       Balance Overall balance assessment: Needs assistance Sitting-balance support: No upper extremity supported, Feet unsupported Sitting balance-[name] Scale: Good Sitting balance - Comments: able to perform self-care tasks with BUE unsupported in long sit with no LOB  Cognition Arousal/Alertness: Awake/alert Behavior During Therapy: Anxious Overall Cognitive Status: Within Functional Limits for tasks assessed                                 General Comments: Anxiety and  slow progressing. Self-limiting at times but will do more with encouragement.        Exercises Amputee Exercises Hip Flexion/Marching: AROM, Both, 5 reps, Supine, Sidelying Other Exercises Other Exercises: supine/sidelying RLE AROM: hip circles with residual limb (only a few reps today due to pain) Other Exercises: pt defers seated LE exercises due to pain and wanting to eat lunch    General Comments General comments (skin integrity, edema, etc.): SpO2 100% on 3L O2 [place] (chronic 3L at home); HR 92 bpm sitting EOB      Pertinent Vitals/Pain Pain Assessment Pain Assessment: Faces Faces Pain Scale: Hurts even more Pain Location: R residual limb Pain Descriptors / Indicators: Grimacing, Guarding, Discomfort, Spasm Pain Intervention(s): Monitored during session, Repositioned, Premedicated before session     PT Goals (current goals can now be found in the care plan section) Acute Rehab PT Goals Patient Stated Goal: decreased pain, increased independence PT Goal Formulation: With patient Potential to Achieve Goals: Fair Progress towards PT goals: Progressing toward goals    Frequency    Min 2X/week      PT Plan Current plan remains appropriate       AM-PAC PT "6 Clicks" Mobility   Outcome Measure  Help needed turning from your back to your side while in a flat bed without using bedrails?: A Little Help needed moving from lying on your back to sitting on the side of a flat bed without using bedrails?: A Lot (mod cues) Help needed moving to and from a bed to a chair (including a wheelchair)?: Total Help needed standing up from a chair using your arms (e.g., wheelchair or bedside chair)?: Total Help needed to walk in hospital room?: Total Help needed climbing 3-5 steps with a railing? : Total 6 Click Score: 9    End of Session Equipment Utilized During Treatment: Oxygen Activity Tolerance: Patient limited by pain;Other (comment) (pain/anxiety limiting) Patient left: in  bed;with call bell/phone within reach;with bed alarm set Nurse Communication: Mobility status;Patient requests pain meds;Other (comment) (pt needs bari recliner for her room and needs chuck pad changed out when she gets her next bath.) PT Visit Diagnosis: Other abnormalities of gait and mobility (R26.89);Muscle weakness (generalized) (M62.81);Difficulty in walking, not elsewhere classified (R26.2);Pain Pain - Right/Left: Right Pain - part of body: Leg     Time: 1206-1230 PT Time Calculation (min) (ACUTE ONLY): 24 min  Charges:  $Therapeutic Exercise: 8-22 mins $Therapeutic Activity: 8-22 mins                     Reagen Haberman P., PTA Acute Rehabilitation Services Secure Chat Preferred 9a-5:30pm Office: [place] 08/22/2021, 1:43 PM

## 2021-08-22 NOTE — Progress Notes (Signed)
PROGRESS NOTE    Courtney Gray  FBP:[id number] DOB: 1970-05-03 DOA: 08/03/2021 PCP: [facility]   Brief Narrative:  51 y.o. female with medical history significant of end-stage renal disease on hemodialysis, gout, diabetes mellitus type II, hypertension who just discharged from rehab this past Monday and was told that she had run out of her days and that she needed to pay out-of-pocket for any further rehab.  She presented again with worsening right foot pain and there was a concern for ischemic foot.  She was admitted and transferred to [facility].  CT angiogram was inconclusive.  She underwent arteriogram on 08/07/2021.  Seen by podiatry as well as vascular surgery.  Eventually underwent right AKA.  She is hemodialysis patient.     Assessment & Plan:   Ischemic right foot -underwent AKA on 08/14/2021.  Her pain is controlled on current medications.  Ulcer left middle finger:  -Apparently, patient had developed this ulcer on the left middle finger even before admission this time but she never mentioned this to anyone.   -She has been applying cream on this and according to her, it has been healing.  -x ray x 2 does not show osteomyelitis -get CT scan as WBC count up with no explanation  -per vascular:  discontinue ointment; start betadine paint every shift; plan was to allow patient to recover from R AKA prior to considering L arm angiography as an outpatient  End-stage renal disease on hemodialysis -Nephrology following.  Dialysis as per nephrology schedule  Chronic respiratory failure with hypoxia -Chronically on 3 L oxygen via nasal cannula.  Respiratory status currently stable  Diabetes mellitus type 2 with hypoglycemia -A1c 5.7. -Glipizide and Lantus were discontinued during last hospitalization because of episodes of hypoglycemia.  On Semglee 10 units and SSI.  Blood sugar controlled.  Essential hypertension:  -Continue amlodipine and Coreg.  Acute on  chronic anemia of chronic kidney disease: Hemoglobin has remained fairly stable for last few days, currently 7.3.  Transfuse if less than 7.  Hyperlipidemia -continue statin  Hypersomnolence -Likely due to combination of illness and medications.  Gabapentin dose has been decreased.  Judicious use of narcotics.  Feeling much better and fully awake and alert Since 08/10/2021.  History of hemorrhoids -Stable  Questionable history of gout -apparently treated for gout of her right foot recently  Morbid obesity:  Estimated body mass index is 55.47 kg/m as calculated from the following:   Height as of this encounter: 5\' 5"  (1.651 m).   Weight as of this encounter: 151.2 kg.    DVT prophylaxis: Subcutaneous heparin  code Status: Full Disposition Plan: Status is: Inpatient Remains inpatient appropriate because:   Waiting for SNF bed placement vs home   Consultants: Vascular surgery/podiatry/nephrology  Procedures: Left lower extremity arteriogram on 08/07/2021. Right AKA    Subjective: Refused labs this AM  Objective: Vitals:   08/21/21 2027 08/21/21 2349 08/22/21 0754 08/22/21 1204  BP: (!) 141/68 (!) 120/43 (!) 155/88 138/87  Pulse: 78 94 90 89  Resp: 13 20 20 18   Temp: 98.1 F (36.7 C) 98.1 F (36.7 C) 98.6 F (37 C) 98 F (36.7 C)  TempSrc: Oral Oral Oral Oral  SpO2: 98% 96% 94% 100%  Weight:      Height:        Intake/Output Summary (Last 24 hours) at 08/22/2021 1302 Last data filed at 08/21/2021 2000 Gross per 24 hour  Intake 240 ml  Output --  Net 240 ml  Filed Weights   08/19/21 0330 08/20/21 0730 08/20/21 1126  Weight: (!) 153.2 kg (!) 154.2 kg (!) 151.2 kg    Examination:   General: Appearance:    Severely obese female in no acute distress   Ulcer on left middle finger  Lungs:     respirations unlabored  Heart:    Normal heart rate.    MS:   Right above knee amputation noted.   Neurologic:   Awake, alert, oriented x 3. No apparent focal  neurological           defect.        Data Reviewed: I have personally reviewed following labs and imaging studies  CBC: Recent Labs  Lab 08/20/21 0735  WBC 21.1*  HGB 7.3*  HCT 26.4*  MCV 98.5  PLT 893   Basic Metabolic Panel: Recent Labs  Lab 08/20/21 0735  NA 136  K 4.4  CL 98  CO2 26  GLUCOSE 162*  BUN 50*  CREATININE 11.16*  CALCIUM 7.9*  PHOS 5.9*   GFR: Estimated Creatinine Clearance: 8.9 mL/min (A) (by C-G formula based on SCr of 11.16 mg/dL (H)). Liver Function Tests: Recent Labs  Lab 08/20/21 0735  ALBUMIN 2.5*   No results for input(s): LIPASE, AMYLASE in the last 168 hours. No results for input(s): AMMONIA in the last 168 hours. Coagulation Profile: No results for input(s): INR, PROTIME in the last 168 hours. Cardiac Enzymes: No results for input(s): CKTOTAL, CKMB, CKMBINDEX, TROPONINI in the last 168 hours.  BNP (last 3 results) No results for input(s): PROBNP in the last 8760 hours. HbA1C: No results for input(s): HGBA1C in the last 72 hours. CBG: Recent Labs  Lab 08/21/21 1210 08/21/21 1658 08/21/21 2130 08/22/21 0705 08/22/21 1201  GLUCAP 118* 171* 119* 117* 95   Lipid Profile: No results for input(s): CHOL, HDL, LDLCALC, TRIG, CHOLHDL, LDLDIRECT in the last 72 hours.  Thyroid Function Tests: No results for input(s): TSH, T4TOTAL, FREET4, T3FREE, THYROIDAB in the last 72 hours. Anemia Panel: No results for input(s): VITAMINB12, FOLATE, FERRITIN, TIBC, IRON, RETICCTPCT in the last 72 hours.  Sepsis Labs: No results for input(s): PROCALCITON, LATICACIDVEN in the last 168 hours.  No results found for this or any previous visit (from the past 240 hour(s)).       Radiology Studies: No results found.      Scheduled Meds:  amLODipine  5 mg Oral Daily   aspirin EC  81 mg Oral Q breakfast   atorvastatin  10 mg Oral QHS   calcitRIOL  1 mcg Oral Q T,Th,Sa-HD   calcium carbonate  1,500 mg Oral TID with meals   calcium  carbonate  1.5 tablet Oral With snacks   carvedilol  6.25 mg Oral BID WC   darbepoetin (ARANESP) injection - DIALYSIS  200 mcg Intravenous Q Tue-HD   docusate sodium  100 mg Oral BID   feeding supplement (NEPRO CARB STEADY)  237 mL Oral TID WC   gabapentin  100 mg Oral BID   heparin injection (subcutaneous)  5,000 Units Subcutaneous Q8H   insulin aspart  0-6 Units Subcutaneous TID WC   insulin glargine-yfgn  10 Units Subcutaneous Daily   lidocaine  1 patch Transdermal Q24H   lisinopril  20 mg Oral Daily   mupirocin ointment  1 application. Topical BID   nystatin   Topical TID   pantoprazole  40 mg Oral Daily   polyethylene glycol  17 g Oral Daily   sodium chloride flush  10-40 mL Intracatheter Q12H   sodium chloride flush  3 mL Intravenous Q12H   Continuous Infusions:  sodium chloride     ferric gluconate (FERRLECIT) IVPB 125 mg (08/20/21 1055)   magnesium sulfate bolus IVPB      Geradine Girt, DO [facility] 08/22/2021, 1:02 PM

## 2021-08-22 NOTE — Progress Notes (Signed)
Patient has been sitting on a paper chuck for at least 2 days. The paper chuck is soiled from her stump. She refused to move or roll for staff to remove the soiled chuck. Patient has been sitting in the same spot and will not try. She has scissors and has been cutting at the chuck stating this was her project to get the chuck from under her. RN educated the patient, advising that she is putting herself at risk for infection and or wounds due to remaining on that soiled chuck in the same position. The patient will just dangle her left leg off the bed but will not make any more movement than that. RN advised she would get additional staff to assist in moving her and the patient continued to refuse. The patient was on face time with her sister and daughter who asked to speak to the nurse with questions. The nurse advised them the patient needed to be moved and she could not guarantee the patient would not feel any discomfort. The patient has had a major surgery and is healing but that does not mean she should stay in one spot because she is hurting herself more by doing this. The family convinced the patient to allow staff to reposition her in the bed. When the patient was moved her bottom was able to be cleaned and she did have a small circular, nickel sized pink skin tear, where she had been stuck to the paper chuck.

## 2021-08-22 NOTE — Progress Notes (Signed)
Mobility Specialist Progress Note   08/22/21 1500  Mobility  Activity Turned to back - supine  Level of Assistance Minimal assist, patient does 75% or more  RLE Weight Bearing NWB  Activity Response Tolerated fair  $Mobility charge 1 Mobility   Received pt sitting EOB requesting assistance to lay down. Incr time required d/t decr pain tolerance. Pt c/o pain in R limb and at bed wound sore throughout transition. MinA needed to reposition up in bed . Left w/ call bell placed in reach and IV team in room.   Holland Falling Mobility Specialist Phone Number [phone]

## 2021-08-22 NOTE — Progress Notes (Signed)
   Durable Medical Equipment (From admission, onward)        Start     Ordered  08/22/21 1552  For home use only DME Other see comment  Once      Comments: Slide board 29-30 inch Question:  Length of Need  Answer:  Lifetime  08/22/21 1551  08/22/21 1551  For home use only DME Hospital bed  Once      Question Answer Comment Length of Need Lifetime  Patient has (list medical condition): Amputation and obesity  The above medical condition requires: Patient requires the ability to reposition frequently  Head must be elevated greater than: 30 degrees  Bed type Semi-electric  Support Surface: Gel Overlay    08/22/21 1551

## 2021-08-22 NOTE — TOC Progression Note (Addendum)
Transition of Care (TOC) - Progression Note  Marvetta Gibbons RN, BSN Transitions of Care Unit 4E- RN Case Manager See Treatment Team for direct phone #    Patient Details  Name: Courtney Gray MRN: [id number] Date of Birth: [birth year]  Transition of Care [facility]) CM/SW Contact  Dahlia Client Romeo Rabon, RN Phone Number: 08/22/2021, 3:57 PM  Clinical Narrative:    Per CSW pt has no SNF bed offers and will need to look at return home w/ [facility] services. CM in to speak with pt about transition plans to return home with daughter to assist.  Per pt she uses transport to HD - TTS. Daughter assist as much as she is able to. Pt asking about aide services- explained Medicare does not cover aide services for ADLs, household assistance, discussed applying for Medicaid covered PCS services. Pt voiced understanding- CM will provide PCS application for pt to f/u with her primary care provider.  Pt also asking about handicapped housing which this is also something that she and her daughter will need to look into themselves-provided pt suggestions of reaching out to her DSS caseworker or asking her HD CSW for any community resources regarding handicapped housing .   Discussed with pt HH and DME needs- pt voiced that she was active with [place] for [facility] services and would like to continue [facility] services with them if possible. Will request HHPT/OT/aide and reach out to [facility] to confirm they can still service.  Pt voices that she has rollator, wheelchair, BSC, cane, as well as home 02 w/ [employer] at home. She also now has a ramp that has been installed.  Pt requesting hospital bed and slide board for home- will have [facility] DME orders- Pt voiced that she has not preference for DME agency. Will use in house provider- [facility]. Daughter is home TTS, works MWF- will attempt to have bed delivered within the next 24-48 hr- prior to pt returning home.   Address, phone #s and PCP all confirmed with pt in epic.   Also discussed  transportation home with pt, pt voices that she does not want to go by EMS, she would prefer w/c transport- pt would need to have her w/c in order to transport home via w/c van. Discussed with pt to think about safest way to transport home either EMS, w/c [name] Lei (needs her w/c), or daughter's care if feels she can safely transfer in and out. CM will f/u for final decision regarding transport.   Msg sent to [facility] for [facility] and DME orders.   Call made to [employer] for DME needs- hospital bed and sliding board- will attempt to deliver tomorrow if able.  1645- Per [employer]- pt has home 02 with them not [employer]  Call made to Angie with [place] left- return call pending to confirm [facility] services.  1600- received return call from [facility]- they have confirmed they will service pt again for [facility] needs- referral accepted for HHPT/OT/aide.    Expected Discharge Plan: [place] Barriers to Discharge: No SNF bed, Other (must enter comment), Continued Medical Work up (Awaiting DME delivery of hospital bed)  Expected Discharge Plan and Services Expected Discharge Plan: [place] In-house Referral: Clinical Social Work Discharge Planning Services: CM Consult Post Acute Care Choice: Durable Medical Equipment, Home Health Living arrangements for the past 2 months: Apartment                 DME Arranged: Other see comment,  Hospital bed (Slide board) DME Agency: [facility] Date DME Agency Contacted: 08/22/21 Time DME Agency Contacted: [phone] Representative spoke with at DME Agency: Jodell Cipro HH Arranged: PT, OT, Nurse's Aide [place] Agency: [place] Date [place]: 08/22/21 Time [place]: 60 Representative spoke with at [place]: [place] ([place]) Interventions    Readmission Risk Interventions    07/12/2021   12:38 PM 05/23/2021    1:12 PM  Readmission Risk Prevention Plan  Transportation Screening Complete Complete   Medication Review Press photographer) Complete Complete  PCP or Specialist appointment within 3-5 days of discharge Complete Complete  HRI or Home Care Consult Complete Complete  SW Recovery Care/Counseling Consult Complete Complete  Palliative Care Screening Not Applicable Not [place] Complete Not Applicable

## 2021-08-22 NOTE — Progress Notes (Signed)
Patient is expressing concern over her middle finger on her left hand and becoming upset stating nothing is being done. She wants the finger to be wrapped, but then states it hurts too bad. The patient stated she got a blood blister on the finger when she was at the last SNF from the needles used to take her blood sugar being too long, which caused the blister to form. The patient also stated that she bursted the blister and used fingernail clippers to cut the excess skin off herself. The finger is now dark, swollen and painful to the touch. The patient is insisting a doctor look at her finger. I assured the patient that the doctor has been made aware and it is up to them what the treatment will be if any.

## 2021-08-22 NOTE — Progress Notes (Signed)
[facility] Progress Note   Subjective:   Patient seen and examined at bedside.  Mild pain in L foot.  No other complaints.  Denies CP, SOB, abdominal pain and n/v/d.   Objective Vitals:   08/21/21 1656 08/21/21 2027 08/21/21 2349 08/22/21 0754  BP: (!) 148/70 (!) 141/68 (!) 120/43 (!) 155/88  Pulse: 81 78 94 90  Resp: 16 13 20 20   Temp: 98.1 F (36.7 C) 98.1 F (36.7 C) 98.1 F (36.7 C) 98.6 F (37 C)  TempSrc: Oral Oral Oral Oral  SpO2: 98% 98% 96% 94%  Weight:      Height:       Physical Exam General:chronically ill appearing, obese female in NAD Heart:RRR, no mrg Lungs:CTAB, nml WOB on 3L O2 via [place] Abdomen:soft, NTND Extremities:R AKA - staples intact, trace LLE edema, L middle finger dressed Dialysis Access: R IJ [facility]    Filed Weights   08/19/21 0330 08/20/21 0730 08/20/21 1126  Weight: (!) 153.2 kg (!) 154.2 kg (!) 151.2 kg    Intake/Output Summary (Last 24 hours) at 08/22/2021 1109 Last data filed at 08/21/2021 2000 Gross per 24 hour  Intake 240 ml  Output --  Net 240 ml    Additional Objective Labs: Basic Metabolic Panel: Recent Labs  Lab 08/20/21 0735  NA 136  K 4.4  CL 98  CO2 26  GLUCOSE 162*  BUN 50*  CREATININE 11.16*  CALCIUM 7.9*  PHOS 5.9*   Liver Function Tests: Recent Labs  Lab 08/20/21 0735  ALBUMIN 2.5*   CBC: Recent Labs  Lab 08/20/21 0735  WBC 21.1*  HGB 7.3*  HCT 26.4*  MCV 98.5  PLT 348   CBG: Recent Labs  Lab 08/21/21 0619 08/21/21 1210 08/21/21 1658 08/21/21 2130 08/22/21 0705  GLUCAP 95 118* 171* 119* 117*   Studies/Results: DG Hand 2 View Left  Result Date: 08/20/2021 CLINICAL DATA:  Lesion post needle stick EXAM: LEFT HAND - 2 VIEW COMPARISON:  08/03/2021 FINDINGS: There is no evidence of fracture or dislocation. There is no evidence of arthropathy or other focal bone abnormality. Extensive vascular calcifications. No subcutaneous gas or radiodense foreign body. IMPRESSION: No acute  findings Electronically Signed   By: Lucrezia Europe M.D.   On: 08/20/2021 13:04    Medications:  sodium chloride     ferric gluconate (FERRLECIT) IVPB 125 mg (08/20/21 1055)   magnesium sulfate bolus IVPB      amLODipine  5 mg Oral Daily   aspirin EC  81 mg Oral Q breakfast   atorvastatin  10 mg Oral QHS   calcitRIOL  1 mcg Oral Q T,Th,Sa-HD   calcium carbonate  1,500 mg Oral TID with meals   calcium carbonate  1.5 tablet Oral With snacks   carvedilol  6.25 mg Oral BID WC   darbepoetin (ARANESP) injection - DIALYSIS  200 mcg Intravenous Q Tue-HD   docusate sodium  100 mg Oral BID   feeding supplement (NEPRO CARB STEADY)  237 mL Oral TID WC   gabapentin  100 mg Oral BID   heparin injection (subcutaneous)  5,000 Units Subcutaneous Q8H   insulin aspart  0-6 Units Subcutaneous TID WC   insulin glargine-yfgn  10 Units Subcutaneous Daily   lidocaine  1 patch Transdermal Q24H   lisinopril  20 mg Oral Daily   mupirocin ointment  1 application. Topical BID   nystatin   Topical TID   pantoprazole  40 mg Oral Daily   polyethylene glycol  17 g Oral Daily   sodium chloride flush  10-40 mL Intracatheter Q12H   sodium chloride flush  3 mL Intravenous Q12H    Dialysis Orders: [name]  4h 75min  165kg  1K/2.5Ca bath  TDC  400 BFR  - 4/29 > HBsAg neg w/ Abs < 10  - mircera 200ug q2  - calcitriol 1 ug tiw  - sensipar 30 mg tiw   Assessment/Plan: Bilateral foot pain - w/ ischemic changes R foot+ gangrenous toes x VVS consulted. S/p Bilat LE angiogram 5/3 per Dr. Virl Cagey note - severe calcific disease bilaterally.  Underwent AKA on 5/10 Pain in R middle finger non healing ulcer w/surrounding erythema/edema.  Xray with no acute findings.  VVS recommends betadine wash every shift. Possible angiography as outpatient of L arm once patient recovered from R AKA.   ESRD - on HD TTS. HD today per regular schedule, 2nd shift if possible Volume - mostly euvolemic, has been tolerated UF 3L with HD.   Will need new EDW on d/c. ~151kg.  HTN - Blood pressure well controlled. Home meds per pmd as needed. Currently on amlodipine 5mg  qd, carvedilol 6.25mg  BID, and lisinopril 20mg  qd.   Anemia ckd - last Hgb 7.3 post op. Redose ESA with HD 5/16, not now change to qTues. Iron sat 20% - give IV iron with hD.  MBD ckd - CCa and phos ok. Sensipar on hold d/y hypocalcemia. Cont vdra w/ HD tiw. On tums as binder. Chronic hypoxic respiratory failure - on home oxygen  Nutrition- Renal diet w/fluid restrictions.  Alb 2.5. Ordered Nepro supplement but gives diarrhea so not drinking.  Daughter can bring protein bar. Dispo - SNF, waiting for bed placement  Jen Mow, PA-C [place] 08/22/2021,11:09 AM  LOS: 18 days

## 2021-08-22 NOTE — TOC Progression Note (Signed)
Transition of Care [facility]) - Progression Note    Patient Details  Name: Courtney Gray MRN: [id number] Date of Birth: 1971/01/18  Transition of Care [facility]) CM/SW Contact  Vinie Sill, [place] Phone Number: 08/22/2021, 2:20 PM  Clinical Narrative:     CSW informed patient's daughter, [name] declined.   CSW informed referrals has been sent to other surrounding areas and unfortunately still no bed offers. She inquired about hospital bed and lift chair-informed RNCM will contact. She states understanding.    TOC will continue to follow and assist with discharge planning.   Expected Discharge Plan: Skilled Nursing Facility Barriers to Discharge: Insurance Authorization, SNF Pending bed offer (bariatric needs & dialysis needs)  Expected Discharge Plan and Services Expected Discharge Plan: [place] In-house Referral: Clinical Social Work                                             Social Determinants of Health (SDOH) Interventions    Readmission Risk Interventions    07/12/2021   12:38 PM 05/23/2021    1:12 PM  Readmission Risk Prevention Plan  Transportation Screening Complete Complete  Medication Review Press photographer) Complete Complete  PCP or Specialist appointment within 3-5 days of discharge Complete Complete  HRI or Home Care Consult Complete Complete  SW Recovery Care/Counseling Consult Complete Complete  Palliative Care Screening Not Applicable Not [place] Complete Not Applicable

## 2021-08-23 DIAGNOSIS — M79673 Pain in unspecified foot: Secondary | ICD-10-CM | POA: Diagnosis not present

## 2021-08-23 DIAGNOSIS — I998 Other disorder of circulatory system: Secondary | ICD-10-CM | POA: Diagnosis not present

## 2021-08-23 LAB — GLUCOSE, CAPILLARY
Glucose-Capillary: 138 mg/dL — ABNORMAL HIGH (ref 70–99)
Glucose-Capillary: 112 mg/dL — ABNORMAL HIGH (ref 70–99)
Glucose-Capillary: 93 mg/dL (ref 70–99)
Glucose-Capillary: 149 mg/dL — ABNORMAL HIGH (ref 70–99)

## 2021-08-23 MED ORDER — DM-GUAIFENESIN ER 30-600 MG PO TB12: 1.0000 | ORAL_TABLET | Freq: Two times a day (BID) | ORAL | Status: DC | PRN

## 2021-08-23 MED ORDER — TRAZODONE HCL 50 MG PO TABS
50.0000 mg | ORAL_TABLET | Freq: Every evening | ORAL | Status: DC | PRN
  Administered 2021-08-23 – 2021-08-28 (×5): 50 mg via ORAL
  Filled 2021-08-23 (×6): qty 1

## 2021-08-23 MED ORDER — HEPARIN SODIUM (PORCINE) 1000 UNIT/ML IJ SOLN
INTRAMUSCULAR | Status: AC
  Filled 2021-08-23: qty 4

## 2021-08-23 NOTE — Plan of Care (Signed)
  Problem: Health Behavior/Discharge Planning: Goal: Ability to manage health-related needs will improve Outcome: Progressing   Problem: Clinical Measurements: Goal: Ability to maintain clinical measurements within normal limits will improve Outcome: Progressing Goal: Will remain free from infection Outcome: Progressing Goal: Diagnostic test results will improve Outcome: Progressing Goal: Respiratory complications will improve Outcome: Progressing Goal: Cardiovascular complication will be avoided Outcome: Progressing   Problem: Activity: Goal: Risk for activity intolerance will decrease Outcome: Progressing   Problem: Nutrition: Goal: Adequate nutrition will be maintained Outcome: Progressing   Problem: Coping: Goal: Level of anxiety will decrease Outcome: Progressing   Problem: Elimination: Goal: Will not experience complications related to bowel motility Outcome: Progressing Goal: Will not experience complications related to urinary retention Outcome: Progressing   Problem: Pain Managment: Goal: General experience of comfort will improve Outcome: Progressing   Problem: Safety: Goal: Ability to remain free from injury will improve Outcome: Progressing   Problem: Skin Integrity: Goal: Risk for impaired skin integrity will decrease Outcome: Progressing   Problem: Education: Goal: Knowledge of disease and its progression will improve Outcome: Progressing   Problem: Fluid Volume: Goal: Compliance with measures to maintain balanced fluid volume will improve Outcome: Progressing   Problem: Health Behavior/Discharge Planning: Goal: Ability to manage health-related needs will improve Outcome: Progressing   Problem: Nutritional: Goal: Ability to make healthy dietary choices will improve Outcome: Progressing   Problem: Clinical Measurements: Goal: Complications related to the disease process, condition or treatment will be avoided or minimized Outcome:  Progressing

## 2021-08-23 NOTE — Progress Notes (Signed)
PROGRESS NOTE    Courtney Gray  ZOX:[id number] DOB: September 06, 1970 DOA: 08/03/2021 PCP: [facility]   Brief Narrative:  51 y.o. female with medical history significant of end-stage renal disease on hemodialysis, gout, diabetes mellitus type II, hypertension who just discharged from rehab this past Monday and was told that she had run out of her days and that she needed to pay out-of-pocket for any further rehab.  She presented again with worsening right foot pain and there was a concern for ischemic foot.  She was admitted and transferred to [facility].  CT angiogram was inconclusive.  She underwent arteriogram on 08/07/2021.  Seen by podiatry as well as vascular surgery.  Eventually underwent right AKA.  She is hemodialysis patient.  Initially patient had a bed at SNF which was later taken away, now [facility] team working on placement again.   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  Supportive care, pain control.   Ulcer left middle finger:  -X-rays negative.  CT of the left finger ordered, pending.  Ultimately may require outpatient hand surgery follow-up per vascular. -get CT scan as WBC count up with no explanation  -Per vascular team-discontinue ointment; start betadine paint every shift; plan was to allow patient to recover from R AKA prior to considering L arm angiography as an outpatient   End-stage renal disease on hemodialysis -Nephrology team following   Chronic respiratory failure with hypoxia -Chronically 2 L nasal cannula   Diabetes mellitus type 2 with hypoglycemia Peripheral neuropathy -A1c 5.7.  Home glipizide on hold. -Semglee, sliding scale Accu-Cheks - Gabapentin   Essential hypertension:  -Norvasc and  Coreg  Acute on chronic anemia of chronic kidney disease:  -Closely monitor hemoglobin.   Hyperlipidemia -Statin   Hypersomnolence -Gabapentin dose reduced.  Closely monitor narcotic administration   History of hemorrhoids -Stable  History of gout -apparently treated for gout of her right foot recently  Morbid obesity:  Estimated body mass index is 55.47 kg/m as calculated from the following:   Height as of this encounter: 5\' 5"  (1.651 m).   Weight as of this encounter: 151.2 kg.       DVT prophylaxis: Subcu heparin Code Status: Full code Family Communication:    Status is: Inpatient Remains inpatient appropriate because: Plans for CT of her finger to rule out any deeper infection or osteomyelitis.  TOC team working on the placement in the meantime.     Subjective: Seen during hemodialysis.  Does not have any new complaints at this time.   Examination:  General exam: Appears calm and comfortable  Respiratory system: Clear to auscultation. Respiratory effort normal. Cardiovascular system: S1 & S2 heard, RRR. No JVD, murmurs, rubs, gallops or clicks. No pedal edema. Gastrointestinal system: Abdomen is nondistended, soft and nontender. No organomegaly or masses felt. Normal bowel sounds heard. Central nervous system: Alert and oriented. No focal neurological deficits. Extremities: Symmetric 5 x 5 power. Skin: Left finger dressing in place.  Right AKA dressing noted no evidence of bleeding Psychiatry: Judgement and insight appear normal. Mood & affect appropriate.     Objective: Vitals:   08/22/21 1642 08/22/21 2017 08/23/21 0554 08/23/21 0820  BP: (!) 144/96 (!) 162/73 (!) 146/73 (!) 123/97  Pulse: 86 93 74 77  Resp: 20 20 17 15   Temp: 97.9 F (36.6 C) 98.1 F (36.7 C) 98 F (36.7 C)   TempSrc: Oral Oral Oral Oral  SpO2: 94% 93% 98%   Weight:   (!) 153.3 kg (!) 152.8 kg  Height:        Intake/Output Summary (Last 24 hours) at 08/23/2021 0844 Last data  filed at 08/22/2021 2017 Gross per 24 hour  Intake 840 ml  Output --  Net 840 ml   Filed Weights   08/20/21 1126 08/23/21 0554 08/23/21 0820  Weight: (!) 151.2 kg (!) 153.3 kg (!) 152.8 kg     Data Reviewed:   CBC: Recent Labs  Lab 08/20/21 0735  WBC 21.1*  HGB 7.3*  HCT 26.4*  MCV 98.5  PLT 161   Basic Metabolic Panel: Recent Labs  Lab 08/20/21 0735  NA 136  K 4.4  CL 98  CO2 26  GLUCOSE 162*  BUN 50*  CREATININE 11.16*  CALCIUM 7.9*  PHOS 5.9*   GFR: Estimated Creatinine Clearance: 9 mL/min (A) (by C-G formula based on SCr of 11.16 mg/dL (H)). Liver Function Tests: Recent Labs  Lab 08/20/21 0735  ALBUMIN 2.5*   No results for input(s): LIPASE, AMYLASE in the last 168 hours. No results for input(s): AMMONIA in the last 168 hours. Coagulation Profile: No results for input(s): INR, PROTIME in the last 168 hours. Cardiac Enzymes: No results for input(s): CKTOTAL, CKMB, CKMBINDEX, TROPONINI in the last 168 hours. BNP (last 3 results) No results for input(s): PROBNP in the last 8760 hours. HbA1C: No results for input(s): HGBA1C in the last 72 hours. CBG: Recent Labs  Lab 08/22/21 0705 08/22/21 1201 08/22/21 1639 08/22/21 2149 08/23/21 0636  GLUCAP 117* 95 135* 208* 138*   Lipid Profile: No results for input(s): CHOL, HDL, LDLCALC, TRIG, CHOLHDL, LDLDIRECT in the last 72 hours. Thyroid Function Tests: No results for input(s): TSH, T4TOTAL, FREET4, T3FREE, THYROIDAB in the last 72 hours. Anemia Panel: No results for input(s): VITAMINB12, FOLATE, FERRITIN, TIBC, IRON, RETICCTPCT in the last 72 hours. Sepsis Labs: No results for input(s): PROCALCITON, LATICACIDVEN in the last 168 hours.  No results found for this or any previous visit (from the past 240 hour(s)).       Radiology Studies: No results found.      Scheduled Meds:  amLODipine  5 mg Oral Daily   aspirin EC  81 mg Oral Q breakfast   atorvastatin  10 mg Oral QHS   calcitRIOL   1 mcg Oral Q T,Th,Sa-HD   calcium carbonate  1,500 mg Oral TID with meals   calcium carbonate  1.5 tablet Oral With snacks   carvedilol  6.25 mg Oral BID WC   darbepoetin (ARANESP) injection - DIALYSIS  200 mcg Intravenous Q Tue-HD   docusate sodium  100 mg Oral BID   feeding supplement (NEPRO CARB STEADY)  237 mL Oral TID WC   gabapentin  100 mg Oral BID   heparin injection (subcutaneous)  5,000 Units Subcutaneous Q8H   insulin aspart  0-6 Units Subcutaneous TID WC   insulin glargine-yfgn  10 Units Subcutaneous Daily   lidocaine  1 patch Transdermal Q24H   lisinopril  20 mg Oral Daily   mupirocin ointment  1 application. Topical BID   nystatin   Topical TID   pantoprazole  40 mg Oral Daily   polyethylene glycol  17 g Oral Daily   sodium chloride flush  10-40 mL  Intracatheter Q12H   sodium chloride flush  3 mL Intravenous Q12H   Continuous Infusions:  sodium chloride     ferric gluconate (FERRLECIT) IVPB 125 mg (08/20/21 1055)   magnesium sulfate bolus IVPB       LOS: 19 days   Time spent= 35 mins    Amogh Komatsu Arsenio Loader, MD [facility]  If 7PM-7AM, please contact night-coverage  08/23/2021, 8:44 AM

## 2021-08-23 NOTE — TOC Progression Note (Signed)
Transition of Care (TOC) - Progression Note  Marvetta Gibbons RN, BSN Transitions of Care Unit 4E- RN Case Manager See Treatment Team for direct phone #    Patient Details  Name: Courtney Gray MRN: [id number] Date of Birth: 27-Oct-1970  Transition of Care [facility]) CM/SW Contact  Dahlia Client, Romeo Rabon, RN Phone Number: 08/23/2021, 2:24 PM  Clinical Narrative:    Notified by PT/OT that pt will need hoyer lift for home, called and spoke with Jodell Cipro at [employer] to add lift to DME order for hospital bed- [employer] will deliver lift to home as well.   Therapies also feel pt will need PTAR transport home as safest option as she was unable to transfer to chair.   CM spoke with pt at bedside, discussed above with pt, Pt reports her hospital bed and slide board have already been delivered to the home, explained that [employer] would delivery Lift to the home as well, anticipate by tomorrow.  Discussed EMS transport- which pt is not agreeable to as well- feel pt would not be safe to transfer to car or wheelchair and needs to go by PTAR.   CM provided pt with Medicaid PCS application, pt to f/u with Primary care.  TOC has DME/HH set up and pt ready for transition home from [facility] standpoint when medically cleared by MD- this has been shared with pt as well.    Expected Discharge Plan: [place] Barriers to Discharge: No SNF bed, Other (must enter comment), Continued Medical Work up (Awaiting DME delivery of hospital bed)  Expected Discharge Plan and Services Expected Discharge Plan: [place] In-house Referral: Clinical Social Work Discharge Planning Services: CM Consult Post Acute Care Choice: Durable Medical Equipment, Home Health Living arrangements for the past 2 months: Apartment                 DME Arranged: Other see comment, Hospital bed (Slide board) DME Agency: [employer] Date DME Agency Contacted: 08/22/21 Time DME Agency Contacted: [phone] Representative spoke  with at DME Agency: Jodell Cipro HH Arranged: PT, OT, Nurse's Aide [place] Agency: [place] Date [place]: 08/22/21 Time [place]: 98 Representative spoke with at [place]: [place] ([place]) Interventions    Readmission Risk Interventions    07/12/2021   12:38 PM 05/23/2021    1:12 PM  Readmission Risk Prevention Plan  Transportation Screening Complete Complete  Medication Review Press photographer) Complete Complete  PCP or Specialist appointment within 3-5 days of discharge Complete Complete  HRI or Home Care Consult Complete Complete  SW Recovery Care/Counseling Consult Complete Complete  Palliative Care Screening Not Applicable Not [place] Complete Not Applicable

## 2021-08-23 NOTE — Progress Notes (Signed)
Occupational Therapy Treatment Patient Details Name: Courtney Gray MRN: [id number] DOB: December 22, 1970 Today's Date: 08/23/2021   History of present illness Pt is a 51 y.o. female admitted 4/29 with ischemic R foot. 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 PMH: ESRD on HD, gout, DMII, HTN.   OT comments  Patient continues to make incremental progress towards goals in skilled OT session. Patient's session encompassed attempt at co-treat as patient was refused SNF and needs to be able to complete slide board transfer for dialysis. Patient adamantly refusing OOB attempts, stating she needs to do things in her own time. PTA and OT explaining to patient that therapy would have to recommend lift for OOB activity at home, with patient in agreement. Patient completing lower body dressing in long sitting at set up level, engaging in posterior scooting in long sit, however returning to supine instead  and able to pull herself up with overhead rail a few inches with only minA. +2 for rolling L/R today due to pain but anticipate pt could perform with +1 assist when pt pain properly controlled. OT recommendation made for [facility] with equipment recommendation updated. OT will continue to follow acutely.       Recommendations for follow up therapy are one component of a multi-disciplinary discharge planning process, led by the attending physician.  Recommendations may be updated based on patient status, additional functional criteria and insurance authorization.    Follow Up Recommendations  Home health OT    Assistance Recommended at Discharge Frequent or constant Supervision/Assistance  Patient can return home with the following  Two people to help with walking and/or transfers;A lot of help with bathing/dressing/bathroom;Assistance with cooking/housework;Direct supervision/assist for financial management;Direct supervision/assist for medications management;Assist for transportation;Help with stairs or ramp for entrance    Equipment Recommendations  Hospital bed;Other (comment) Product manager lift)    Recommendations for Other Services      Precautions / Restrictions Precautions Precautions: Fall;Other (comment) Precaution Comments: R AKA, self-limiting, L finger ulcer Restrictions Weight Bearing Restrictions: Yes RLE Weight Bearing: Non weight bearing       Mobility Bed Mobility Overal bed mobility: Needs Assistance Bed Mobility: Rolling, Supine to Sit, Sit to Supine Rolling: Min assist, +2 for safety/equipment   Supine to sit: HOB elevated, Modified independent (Device/Increase time) Sit to supine: Modified independent (Device/Increase time)   General bed mobility comments: Pt received in long sit and able to perform supine<>long sit multiple times during session from hospital bed elevated head posture; pt refusing EOB scooting and when attempting posterior scooting in long sit, pt returns to supine and able to pull herself up with overhead rail a few inches with only minA. +2 for rolling L/R today due to pain but anticipate pt could perform with +1 assist when pt pain properly controlled.    Transfers Overall transfer level: Needs assistance                 General transfer comment: Discussed technique for lateral vs posterior seated scooting in long sit, pt attempting posterior scooting but needing to lay supine to perform with overhead rail due to pain in long sitting. Ultimately pt agreeable it will be safer for her and daughter to use mechanical lift for OOB transfers to her WC.     Balance Overall balance assessment: Needs assistance Sitting-balance support: No upper extremity supported, Feet unsupported Sitting balance-[name] Scale: Good Sitting balance - Comments: able to perform self-care tasks unsupported in long sit with no LOB   Standing balance support: Bilateral upper extremity  supported, During functional activity, Reliant on assistive device for balance Standing balance-[name]  Scale: Fair Standing balance comment: pt unable to attempt due to pain                           ADL either performed or assessed with clinical judgement   ADL Overall ADL's : Needs assistance/impaired     Grooming: Set up;Sitting;Bed level               Lower Body Dressing: Set up;Bed level               Functional mobility during ADLs: Moderate assistance;Maximal assistance;+2 for physical assistance;+2 for safety/equipment;Cueing for safety;Cueing for sequencing General ADL Comments: Patient refusing to attempt slide board transfer, only engaging in scooting and bed level ADLs    Extremity/Trunk Assessment              Vision       Perception     Praxis      Cognition Arousal/Alertness: Awake/alert Behavior During Therapy: Anxious Overall Cognitive Status: Within Functional Limits for tasks assessed                                 General Comments: Anxiety and slow progressing, pt self-limiting due to fear of pain this date despite premedication in HD prior to session.        Exercises      Shoulder Instructions       General Comments      Pertinent Vitals/ Pain       Pain Assessment Pain Assessment: Faces Faces Pain Scale: Hurts even more Pain Location: R residual limb Pain Descriptors / Indicators: Grimacing, Guarding, Discomfort, Spasm, Moaning Pain Intervention(s): Limited activity within patient's tolerance, Monitored during session, Premedicated before session, Repositioned  Home Living                                          Prior Functioning/Environment              Frequency  Min 2X/week        Progress Toward Goals  OT Goals(current goals can now be found in the care plan section)  Progress towards OT goals: Not progressing toward goals - comment (self limiting in session, refusing attempts OOB)  Acute Rehab OT Goals Patient Stated Goal: to get this figured out OT Goal  Formulation: With patient Time For Goal Achievement: 08/30/21 Potential to Achieve Goals: Poor  Plan Discharge plan needs to be updated    Co-evaluation    PT/OT/SLP Co-Evaluation/Treatment: Yes Reason for Co-Treatment: Complexity of the patient's impairments (multi-system involvement);Necessary to address cognition/behavior during functional activity;For patient/therapist safety;To address functional/ADL transfers PT goals addressed during session: Mobility/safety with mobility;Balance;Strengthening/ROM;Proper use of DME OT goals addressed during session: ADL's and self-care;Strengthening/ROM      AM-PAC OT "6 Clicks" Daily Activity     Outcome Measure   Help from another person eating meals?: A Little Help from another person taking care of personal grooming?: A Little Help from another person toileting, which includes using toliet, bedpan, or urinal?: A Lot Help from another person bathing (including washing, rinsing, drying)?: A Lot Help from another person to put on and taking off regular upper body clothing?: A Little Help from another person to  put on and taking off regular lower body clothing?: A Little 6 Click Score: 16    End of Session    OT Visit Diagnosis: Other abnormalities of gait and mobility (R26.89);Unsteadiness on feet (R26.81);Muscle weakness (generalized) (M62.81);History of falling (Z91.81);Pain Pain - Right/Left: Right Pain - part of body: Leg   Activity Tolerance Patient limited by pain;Patient limited by fatigue   Patient Left in bed;with call bell/phone within reach;with bed alarm set   Nurse Communication Mobility status;Need for lift equipment        Time: 9833-8250 OT Time Calculation (min): 28 min  Charges: OT General Charges $OT Visit: 1 Visit OT Treatments $Self Care/Home Management : 8-22 mins  Corinne Ports E. Ayaat Jansma, OTR/L Acute Rehabilitation Services [phone] [place] 08/23/2021, 3:21 PM

## 2021-08-23 NOTE — Progress Notes (Signed)
Pt and family requested to speak w/ charge nurse about several concerns they have about pt going home tomorrow. Family and pt both agree they are not ready for d/c tomorrow due to a couple concerns:  -does pt still need MRI of her finger? -will pt need her other leg checked out/what the plan is for her other leg -lift has not yet been delivered to home -pt has not used a slide board yet and would like practice before going home -will pt be sent home w/ correct pain medications -can pt have a home health RN or aide as additional support for the daughter  I assured her and family that if disposition is deemed unsafe and they are uncomfortable, that d/c can be delayed until concerns are reasonably met.

## 2021-08-23 NOTE — Progress Notes (Signed)
[facility] Progress Note   Subjective:   Pt seen at start of HD. No dialysis related concerns but she is worried about her finger and redness on her right foot. She denies SOB, CP, dizziness, abdominal pain and nausea.   Objective Vitals:   08/22/21 1204 08/22/21 1642 08/22/21 2017 08/23/21 0554  BP: 138/87 (!) 144/96 (!) 162/73 (!) 146/73  Pulse: 89 86 93 74  Resp: 18 20 20 17   Temp: 98 F (36.7 C) 97.9 F (36.6 C) 98.1 F (36.7 C) 98 F (36.7 C)  TempSrc: Oral Oral Oral Oral  SpO2: 100% 94% 93% 98%  Weight:    (!) 153.3 kg  Height:       Physical Exam General: Obese female, alert and in NAD Heart: RRR, no murmurs, rubs or gallops Lungs: CTA bilaterally without wheezing, rhonchi or rales. Breath sounds diminished throughout Abdomen: Soft, non-distended, +BS Extremities: Trace LLE with mild erythema on first and second toes. R AKA Dialysis Access: R IJ TDC accessed  Additional Objective Labs: Basic Metabolic Panel: Recent Labs  Lab 08/20/21 0735  NA 136  K 4.4  CL 98  CO2 26  GLUCOSE 162*  BUN 50*  CREATININE 11.16*  CALCIUM 7.9*  PHOS 5.9*   Liver Function Tests: Recent Labs  Lab 08/20/21 0735  ALBUMIN 2.5*   No results for input(s): LIPASE, AMYLASE in the last 168 hours. CBC: Recent Labs  Lab 08/20/21 0735  WBC 21.1*  HGB 7.3*  HCT 26.4*  MCV 98.5  PLT 348   Blood Culture    Component Value Date/Time   SDES BLOOD LEFT HAND 09/03/2020 2359   SPECREQUEST  09/03/2020 2359    BOTTLES DRAWN AEROBIC AND ANAEROBIC Blood Culture adequate volume   CULT  09/03/2020 2359    NO GROWTH 5 DAYS Performed at [facility], [address] [place], [place] [address]    REPTSTATUS 09/09/2020 FINAL 09/03/2020 2359    Cardiac Enzymes: No results for input(s): CKTOTAL, CKMB, CKMBINDEX, TROPONINI in the last 168 hours. CBG: Recent Labs  Lab 08/22/21 0705 08/22/21 1201 08/22/21 1639 08/22/21 2149 08/23/21 0636  GLUCAP 117* 95 135* 208*  138*   Iron Studies: No results for input(s): IRON, TIBC, TRANSFERRIN, FERRITIN in the last 72 hours. @lablastinr3 @ Studies/Results: No results found. Medications:  sodium chloride     ferric gluconate (FERRLECIT) IVPB 125 mg (08/20/21 1055)   magnesium sulfate bolus IVPB      amLODipine  5 mg Oral Daily   aspirin EC  81 mg Oral Q breakfast   atorvastatin  10 mg Oral QHS   calcitRIOL  1 mcg Oral Q T,Th,Sa-HD   calcium carbonate  1,500 mg Oral TID with meals   calcium carbonate  1.5 tablet Oral With snacks   carvedilol  6.25 mg Oral BID WC   darbepoetin (ARANESP) injection - DIALYSIS  200 mcg Intravenous Q Tue-HD   docusate sodium  100 mg Oral BID   feeding supplement (NEPRO CARB STEADY)  237 mL Oral TID WC   gabapentin  100 mg Oral BID   heparin injection (subcutaneous)  5,000 Units Subcutaneous Q8H   insulin aspart  0-6 Units Subcutaneous TID WC   insulin glargine-yfgn  10 Units Subcutaneous Daily   lidocaine  1 patch Transdermal Q24H   lisinopril  20 mg Oral Daily   mupirocin ointment  1 application. Topical BID   nystatin   Topical TID   pantoprazole  40 mg Oral Daily   polyethylene glycol  17 g Oral Daily   sodium chloride flush  10-40 mL Intracatheter Q12H   sodium chloride flush  3 mL Intravenous Q12H    Dialysis Orders: [name] T[facility]  4h 62min  165kg  1K/2.5Ca bath  TDC  400 BFR  - 4/29 > HBsAg neg w/ Abs < 10  - mircera 200ug q2  - calcitriol 1 ug tiw  - sensipar 30 mg tiw  Assessment/Plan: Bilateral foot pain - w/ ischemic changes R foot+ gangrenous toes x [facility]VS consulted. S/p Bilat LE angiogram 5/3 per Dr. Virl Cagey note - severe calcific disease bilaterally.  Underwent AKA on 5/10. Per VVS/primary team Pain in R middle finger non healing ulcer w/surrounding erythema/edema.  Xray with no acute findings.  VVS recommends betadine wash every shift. Possible angiography as outpatient of L arm once patient recovered from R AKA.   ESRD - on HD TTS but treatment rolled  over to today. Tolerating dialysis well. Next HD tomorrow per regular schedule.  Volume - mostly euvolemic, has been tolerated UF 3L with HD.  Will need new EDW on d/c. ~151kg.  HTN - Blood pressure well controlled overall, moderately elevated pre-HD. Home meds per pmd as needed. Currently on amlodipine 5mg  qd, carvedilol 6.25mg  BID, and lisinopril 20mg  qd.   Anemia ckd - last Hgb 7.3 post op. Redose ESA with HD 5/16, not now change to qTues. Iron sat 20% - give IV iron with hD.  MBD ckd - CCa and phos ok. Sensipar on hold d/y hypocalcemia. Cont vdra w/ HD tiw. On tums as binder. Checking labs with HD today.  Chronic hypoxic respiratory failure - on home oxygen  Nutrition- Renal diet w/fluid restrictions.  Alb 2.5. Ordered Nepro supplement but gives diarrhea so not drinking.  Daughter can bring protein bar. Dispo - SNF, waiting for bed placement  Anice Paganini, PA-C 08/23/2021, 8:27 AM  [facility] Pager: [phone]

## 2021-08-23 NOTE — Progress Notes (Signed)
OT Cancellation Note  Patient Details Name: Courtney Gray MRN: [id number] DOB: 1970-05-17   Cancelled Treatment:    Reason Eval/Treat Not Completed: Patient at procedure or test/ unavailable Patient currently off floor at dialysis, OT will follow up as time permits.   Corinne Ports E. Esty Ahuja, OTR/L Acute Rehabilitation Services [phone] [place] 08/23/2021, 9:39 AM

## 2021-08-23 NOTE — Progress Notes (Signed)
[facility]  Subjective  -no complaint   Objective (!) 123/97 77 98 F (36.7 C) (Oral) 15 98%  Intake/Output Summary (Last 24 hours) at 08/23/2021 0852 Last data filed at 08/22/2021 2017 Gross per 24 hour  Intake 840 ml  Output --  Net 840 ml    Right AKA healing with staples in place  Laboratory Lab Results: No results for input(s): WBC, HGB, HCT, PLT in the last 72 hours. BMET No results for input(s): NA, K, CL, CO2, GLUCOSE, BUN, CREATININE, CALCIUM in the last 72 hours.  COAG Lab Results  Component Value Date   INR 1.3 (H) 07/10/2021   No results found for: PTT  Assessment/Planning:  51 year old female status post right AKA.  I looked at her amputation stump today and this looks good and is healing.  She has follow-up with [place] on 6/13 for staple removal.  She does have a ulcer on the tip of her left third finger.  Discussed she may ultimately require referral to a hand surgeon which can be done on an outpatient basis if this fails to heal.  She does have 1+ radial pulse at the wrist.  Marty Heck 08/23/2021 8:52 AM --

## 2021-08-23 NOTE — Progress Notes (Signed)
Physical Therapy Treatment Patient Details Name: Courtney Gray MRN: [id number] DOB: Aug 02, 1970 Today's Date: 08/23/2021   History of Present Illness Pt is a 51 y.o. female admitted 4/29 with ischemic R foot. 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 PMH: ESRD on HD, gout, DMII, HTN.    PT Comments    Pt received in long sit in bed, c/o severe RLE pain and fatigue after recently returning from HD dept. Pt frustrated due to fatigue and anxiety regarding discharge plan, so extensive discussion and time spent instructing her on safe transfer options for imminent DC home. Pt instructed on use of mechanical lift with sling as safest option for transfers OOB and to wheelchair for HD apts and mobility around the home. Pt will need improved pain control in order to initiate seated scooting. Pt needing up to +2 minA for rolling with increased time due to pain and for scooting in supine toward [facility] with hospital bed rails. Pt continues to benefit from PT services to progress toward functional mobility goals. Continue to recommend SNF as safest DC plan, however if unavailable to pt, she would benefit from max [facility] services to ensure progression of functional mobility.  Recommendations for follow up therapy are one component of a multi-disciplinary discharge planning process, led by the attending physician.  Recommendations may be updated based on patient status, additional functional criteria and insurance authorization.  Follow Up Recommendations  Skilled nursing-short term rehab (<3 hours/day)     Assistance Recommended at Discharge Frequent or constant Supervision/Assistance  Patient can return home with the following Assistance with cooking/housework;Assist for transportation;Help with stairs or ramp for entrance;Two people to help with walking and/or transfers;Two people to help with bathing/dressing/bathroom;Direct supervision/assist for medications management   Equipment Recommendations  Hospital bed;Wheelchair  (measurements PT);Other (comment) (all bariatric equipment; mechanical (hoyer) lift, slide board)    Recommendations for Other Services       Precautions / Restrictions Precautions Precautions: Fall;Other (comment) Precaution Comments: R AKA, self-limiting, L finger ulcer Restrictions Weight Bearing Restrictions: Yes RLE Weight Bearing: Non weight bearing     Mobility  Bed Mobility Overal bed mobility: Needs Assistance Bed Mobility: Rolling, Supine to Sit, Sit to Supine Rolling: Min assist, +2 for safety/equipment   Supine to sit: HOB elevated, Modified independent (Device/Increase time) Sit to supine: Modified independent (Device/Increase time)   General bed mobility comments: Pt received in long sit and able to perform supine<>long sit multiple times during session from hospital bed elevated head posture; pt refusing EOB scooting and when attempting posterior scooting in long sit, pt returns to supine and able to pull herself up with overhead rail a few inches with only minA. +2 for rolling L/R today due to pain but anticipate pt could perform with +1 assist when pt pain properly controlled.    Transfers Overall transfer level: Needs assistance Equipment used:  (bed pads)               General transfer comment: Discussed technique for lateral vs posterior seated scooting in long sit, pt attempting posterior scooting but needing to lay supine to perform with overhead rail due to pain in long sitting. Ultimately pt agreeable it will be safer for her and daughter to use mechanical lift for OOB transfers to her WC.        Information systems manager mobility:  (pt refusing due to pain)  Modified Rankin (Stroke Patients Only)       Balance Overall balance assessment: Needs assistance Sitting-balance support: No upper  extremity supported, Feet unsupported Sitting balance-[name] Scale: Good Sitting balance - Comments: able to perform self-care  tasks unsupported in long sit with no LOB       Standing balance comment: pt unable to attempt due to pain                            Cognition Arousal/Alertness: Awake/alert Behavior During Therapy: Anxious Overall Cognitive Status: Within Functional Limits for tasks assessed                                 General Comments: Anxiety and slow progressing, pt self-limiting due to fear of pain this date despite premedication in HD prior to session.        Exercises General Exercises - Lower Extremity Ankle Circles/Pumps: Left, AROM, 5 reps, Supine Amputee Exercises Hip Flexion/Marching: AROM, Both, 5 reps, Supine Knee Flexion: AROM, Left, 5 reps, Supine    General Comments        Pertinent Vitals/Pain Pain Assessment Pain Assessment: Faces Faces Pain Scale: Hurts even more Pain Location: R residual limb Pain Descriptors / Indicators: Grimacing, Guarding, Discomfort, Spasm, Moaning Pain Intervention(s): Limited activity within patient's tolerance, Premedicated before session, Monitored during session, Repositioned     PT Goals (current goals can now be found in the care plan section) Acute Rehab PT Goals Patient Stated Goal: decreased pain, increased independence PT Goal Formulation: With patient Potential to Achieve Goals: Fair Progress towards PT goals: Not progressing toward goals - comment (pain/fatigue limiting her)    Frequency    Min 2X/week (would benefit from more)      PT Plan Current plan remains appropriate    Co-evaluation PT/OT/SLP Co-Evaluation/Treatment: Yes Reason for Co-Treatment: Complexity of the patient's impairments (multi-system involvement);For patient/therapist safety;To address functional/ADL transfers PT goals addressed during session: Mobility/safety with mobility;Balance;Strengthening/ROM;Proper use of DME        AM-PAC PT "6 Clicks" Mobility   Outcome Measure  Help needed turning from your back to  your side while in a flat bed without using bedrails?: A Little Help needed moving from lying on your back to sitting on the side of a flat bed without using bedrails?: A Lot (mod cues) Help needed moving to and from a bed to a chair (including a wheelchair)?: Total Help needed standing up from a chair using your arms (e.g., wheelchair or bedside chair)?: Total Help needed to walk in hospital room?: Total Help needed climbing 3-5 steps with a railing? : Total 6 Click Score: 9    End of Session Equipment Utilized During Treatment: Oxygen Activity Tolerance: Patient limited by pain;Other (comment);Patient limited by fatigue (pain/anxiety limiting) Patient left: in bed;with call bell/phone within reach;with bed alarm set Nurse Communication: Mobility status;Other (comment);Need for lift equipment (pt stating pain meds not working well enough, will need PTAR transport home) PT Visit Diagnosis: Other abnormalities of gait and mobility (R26.89);Muscle weakness (generalized) (M62.81);Difficulty in walking, not elsewhere classified (R26.2);Pain Pain - Right/Left: Right Pain - part of body: Leg     Time: 1341-1409 PT Time Calculation (min) (ACUTE ONLY): 28 min  Charges:  $Therapeutic Activity: 8-22 mins                     Soraida Vickers P., PTA Acute Rehabilitation Services Secure Chat Preferred 9a-5:30pm Office: [place] 08/23/2021, 2:50 PM

## 2021-08-24 DIAGNOSIS — M79673 Pain in unspecified foot: Secondary | ICD-10-CM | POA: Diagnosis not present

## 2021-08-24 DIAGNOSIS — I998 Other disorder of circulatory system: Secondary | ICD-10-CM | POA: Diagnosis not present

## 2021-08-24 LAB — DIFFERENTIAL
Abs Immature Granulocytes: 0.65 10*3/uL — ABNORMAL HIGH (ref 0.00–0.07)
Basophils Absolute: 0.1 10*3/uL (ref 0.0–0.1)
Basophils Relative: 0 %
Eosinophils Absolute: 0.3 10*3/uL (ref 0.0–0.5)
Eosinophils Relative: 2 %
Immature Granulocytes: 4 %
Lymphocytes Relative: 8 %
Lymphs Abs: 1.4 10*3/uL (ref 0.7–4.0)
Monocytes Absolute: 2 10*3/uL — ABNORMAL HIGH (ref 0.1–1.0)
Monocytes Relative: 11 %
Neutro Abs: 13.7 10*3/uL — ABNORMAL HIGH (ref 1.7–7.7)
Neutrophils Relative %: 75 %

## 2021-08-24 LAB — RENAL FUNCTION PANEL
Albumin: 2.5 g/dL — ABNORMAL LOW (ref 3.5–5.0)
Anion gap: 10 (ref 5–15)
BUN: 27 mg/dL — ABNORMAL HIGH (ref 6–20)
CO2: 28 mmol/L (ref 22–32)
Calcium: 8 mg/dL — ABNORMAL LOW (ref 8.9–10.3)
Chloride: 96 mmol/L — ABNORMAL LOW (ref 98–111)
Creatinine, Ser: 7.33 mg/dL — ABNORMAL HIGH (ref 0.44–1.00)
GFR, Estimated: 6 mL/min — ABNORMAL LOW (ref >60)
Glucose, Bld: 111 mg/dL — ABNORMAL HIGH (ref 70–99)
Phosphorus: 5.1 mg/dL — ABNORMAL HIGH (ref 2.5–4.6)
Potassium: 4.2 mmol/L (ref 3.5–5.1)
Sodium: 134 mmol/L — ABNORMAL LOW (ref 135–145)

## 2021-08-24 LAB — MAGNESIUM: Magnesium: 2.1 mg/dL (ref 1.7–2.4)

## 2021-08-24 LAB — GLUCOSE, CAPILLARY
Glucose-Capillary: 134 mg/dL — ABNORMAL HIGH (ref 70–99)
Glucose-Capillary: 109 mg/dL — ABNORMAL HIGH (ref 70–99)
Glucose-Capillary: 154 mg/dL — ABNORMAL HIGH (ref 70–99)

## 2021-08-24 LAB — CBC
HCT: 24.3 % — ABNORMAL LOW (ref 36.0–46.0)
Hemoglobin: 7 g/dL — ABNORMAL LOW (ref 12.0–15.0)
MCH: 28.3 pg (ref 26.0–34.0)
MCHC: 28.8 g/dL — ABNORMAL LOW (ref 30.0–36.0)
MCV: 98.4 fL (ref 80.0–100.0)
Platelets: 253 10*3/uL (ref 150–400)
RBC: 2.47 MIL/uL — ABNORMAL LOW (ref 3.87–5.11)
RDW: 18.6 % — ABNORMAL HIGH (ref 11.5–15.5)
WBC: 18.1 10*3/uL — ABNORMAL HIGH (ref 4.0–10.5)
nRBC: 0.5 % — ABNORMAL HIGH (ref 0.0–0.2)

## 2021-08-24 MED ORDER — HEPARIN SODIUM (PORCINE) 1000 UNIT/ML IJ SOLN
INTRAMUSCULAR | Status: AC
  Administered 2021-08-24: 1000 [IU]
  Filled 2021-08-24: qty 4

## 2021-08-24 NOTE — Progress Notes (Signed)
[facility] Progress Note   Subjective:   Seen on HD, requests lower UF goal today, goal reduced to overnight gain. Having phantom limb pain. Denies SOB, CP and dizziness.   Objective Vitals:   08/24/21 0634 08/24/21 0811 08/24/21 0825 08/24/21 0900  BP: (!) 156/83 (!) 109/51 (!) 130/59 (!) 145/50  Pulse: 84 85    Resp: 14 19 15 14   Temp: 98.6 F (37 C) 97.9 F (36.6 C)    TempSrc: Oral Temporal    SpO2: 98% 95%    Weight:  (!) 152 kg    Height:       Physical Exam General: Obese female, alert and in NAD Heart: RRR, no murmurs, rubs or gallops Lungs: CTA bilaterally without wheezing, rhonchi or rales. Breath sounds diminished throughout Abdomen: Soft, non-distended, +BS Extremities: No pitting edema LLE. R AKA Dialysis Access: R [facility] accessed  Additional Objective Labs: Basic Metabolic Panel: Recent Labs  Lab 08/20/21 0735 08/24/21 0835  NA 136 134*  K 4.4 4.2  CL 98 96*  CO2 26 28  GLUCOSE 162* 111*  BUN 50* 27*  CREATININE 11.16* 7.33*  CALCIUM 7.9* 8.0*  PHOS 5.9* 5.1*   Liver Function Tests: Recent Labs  Lab 08/20/21 0735 08/24/21 0835  ALBUMIN 2.5* 2.5*   No results for input(s): LIPASE, AMYLASE in the last 168 hours. CBC: Recent Labs  Lab 08/20/21 0735 08/24/21 0834  WBC 21.1* 18.1*  NEUTROABS  --  13.7*  HGB 7.3* 7.0*  HCT 26.4* 24.3*  MCV 98.5 98.4  PLT 348 253   Blood Culture    Component Value Date/Time   SDES BLOOD LEFT HAND 09/03/2020 2359   SPECREQUEST  09/03/2020 2359    BOTTLES DRAWN AEROBIC AND ANAEROBIC Blood Culture adequate volume   CULT  09/03/2020 2359    NO GROWTH 5 DAYS Performed at [facility], [address] [place], [place] [address]    REPTSTATUS 09/09/2020 FINAL 09/03/2020 2359    Cardiac Enzymes: No results for input(s): CKTOTAL, CKMB, CKMBINDEX, TROPONINI in the last 168 hours. CBG: Recent Labs  Lab 08/23/21 0636 08/23/21 1138 08/23/21 1622 08/23/21 2114 08/24/21 0613  GLUCAP 138*  112* 93 149* 134*   Iron Studies: No results for input(s): IRON, TIBC, TRANSFERRIN, FERRITIN in the last 72 hours. @lablastinr3 @ Studies/Results: No results found. Medications:  sodium chloride     ferric gluconate (FERRLECIT) IVPB Stopped (08/23/21 1226)   magnesium sulfate bolus IVPB      amLODipine  5 mg Oral Daily   aspirin EC  81 mg Oral Q breakfast   atorvastatin  10 mg Oral QHS   calcitRIOL  1 mcg Oral Q T,Th,Sa-HD   calcium carbonate  1,500 mg Oral TID with meals   calcium carbonate  1.5 tablet Oral With snacks   carvedilol  6.25 mg Oral BID WC   darbepoetin (ARANESP) injection - DIALYSIS  200 mcg Intravenous Q Tue-HD   docusate sodium  100 mg Oral BID   feeding supplement (NEPRO CARB STEADY)  237 mL Oral TID WC   gabapentin  100 mg Oral BID   heparin injection (subcutaneous)  5,000 Units Subcutaneous Q8H   insulin aspart  0-6 Units Subcutaneous TID WC   insulin glargine-yfgn  10 Units Subcutaneous Daily   lidocaine  1 patch Transdermal Q24H   lisinopril  20 mg Oral Daily   mupirocin ointment  1 application. Topical BID   nystatin   Topical TID   pantoprazole  40 mg Oral  Daily   polyethylene glycol  17 g Oral Daily   sodium chloride flush  10-40 mL Intracatheter Q12H   sodium chloride flush  3 mL Intravenous Q12H    Dialysis Orders: [name]  4h 75min  165kg  1K/2.5Ca bath  TDC  400 BFR  - 4/29 > HBsAg neg w/ Abs < 10  - mircera 200ug q2  - calcitriol 1 ug tiw  - sensipar 30 mg tiw  Assessment/Plan: Bilateral foot pain - w/ ischemic changes R foot+ gangrenous toes x VVS consulted. S/p Bilat LE angiogram 5/3 per Dr. Virl Cagey note - severe calcific disease bilaterally.  Underwent AKA on 5/10. Per VVS/primary team Pain in R middle finger non healing ulcer w/surrounding erythema/edema.  Xray with no acute findings.  VVS recommends betadine wash every shift. Possible angiography as outpatient of L arm once patient recovered from R AKA.   ESRD - on HD TTS.  Tolerating dialysis well. Continue TTS schedule  Volume - mostly euvolemic, has been tolerated UF 3L with HD.  Will need new EDW on d/c. ~151kg.  HTN - Blood pressure well controlled overall, moderately elevated pre-HD. Home meds per pmd as needed. Currently on amlodipine 5mg  qd, carvedilol 6.25mg  BID, and lisinopril 20mg  qd.   Anemia ckd - last Hgb 7.0. Redose ESA with HD 5/16, not now change to qTues. Iron sat 20% - give IV iron with hD. Transfuse for Hgb <7 MBD ckd - CCa and phos ok. Sensipar on hold d/t hypocalcemia. Cont vdra w/ HD tiw. On tums as binder. Checking labs with HD today.  Chronic hypoxic respiratory failure - on home oxygen  Nutrition- Renal diet w/fluid restrictions.  Alb 2.5. Ordered Nepro supplement but gives diarrhea so not drinking.  Daughter can bring protein bar. Dispo - SNF, waiting for bed placement   Anice Paganini, PA-C 08/24/2021, 9:46 AM  [facility] Pager: [phone]

## 2021-08-24 NOTE — Progress Notes (Signed)
PROGRESS NOTE    Courtney Gray  TML:[id number] DOB: 05/08/1970 DOA: 08/03/2021 PCP: [facility]   Brief Narrative:  51 y.o. female with medical history significant of end-stage renal disease on hemodialysis, gout, diabetes mellitus type II, hypertension who just discharged from rehab this past Monday and was told that she had run out of her days and that she needed to pay out-of-pocket for any further rehab.  She presented again with worsening right foot pain and there was a concern for ischemic foot.  She was admitted and transferred to [facility].  CT angiogram was inconclusive.  She underwent arteriogram on 08/07/2021.  Seen by podiatry as well as vascular surgery.  Eventually underwent right AKA.  She is hemodialysis patient.     Assessment & Plan:   Ischemic right foot -underwent AKA on 08/14/2021.  Her pain is controlled on current medications.  Ulcer left middle finger:  -Apparently, patient had developed this ulcer on the left middle finger even before admission this time but she never mentioned this to anyone.   -She has been applying cream on this and according to her, it has been healing.  -x ray x 2 does not show osteomyelitis -per vascular:  discontinue ointment; start betadine paint every shift; plan was to allow patient to recover from R AKA prior to considering L arm angiography as an outpatient.  CT of the left hand is ordered since 08/22/2021 but is not completed yet.  End-stage renal disease on hemodialysis -Nephrology following.  Dialysis as per nephrology schedule  Chronic respiratory failure with hypoxia -Chronically on 2-3 L oxygen via nasal cannula.  Respiratory status currently stable  Diabetes mellitus type 2 with hypoglycemia -A1c 5.7. -Glipizide and Lantus were discontinued during last hospitalization because of episodes of hypoglycemia.  On Semglee 10 units and SSI.  Blood sugar controlled.  Essential hypertension:  -Continue amlodipine  and Coreg.  Acute on chronic anemia of chronic kidney disease: Hemoglobin is drifting down.  Currently 7.0.  Recheck in the morning.  If 7 or below, will transfuse.  Hyperlipidemia -continue statin  Hypersomnolence -Likely due to combination of illness and medications.  Gabapentin dose has been decreased.  Judicious use of narcotics.  Feeling much better and fully awake and alert Since 08/10/2021.  History of hemorrhoids -Stable  Questionable history of gout -apparently treated for gout of her right foot recently  Morbid obesity:  Estimated body mass index is 55.4 kg/m as calculated from the following:   Height as of this encounter: 5\' 5"  (1.651 m).   Weight as of this encounter: 151 kg.    DVT prophylaxis: Subcutaneous heparin  code Status: Full Family Communication: None at bedside Disposition Plan: Status is: Inpatient Remains inpatient appropriate because:   Waiting for DME to be delivered to her home.   Consultants: Vascular surgery/podiatry/nephrology  Procedures: Left lower extremity arteriogram on 08/07/2021. Right AKA    Subjective:  Seen and examined in the dialysis unit.  She has no complaints but has several concerns about going home even when all the DME is delivered.  She is concerned that she does not know how to use the left and needs to be trained and her daughter needs to be trained as well as she does not think her daughter can handle that either.  I have relayed her concerns to TOC.  Objective: Vitals:   08/24/21 1100 08/24/21 1130 08/24/21 1147 08/24/21 1323  BP: (!) 152/68 (!) 157/76 (!) 174/81 115/79  Pulse:  89 94  Resp: 16 15 14 20   Temp:   97.7 F (36.5 C) 98.5 F (36.9 C)  TempSrc:   Temporal Oral  SpO2:   100% 100%  Weight:   (!) 151 kg   Height:        Intake/Output Summary (Last 24 hours) at 08/24/2021 1328 Last data filed at 08/24/2021 1147 Gross per 24 hour  Intake 470 ml  Output 1084 ml  Net -614 ml    Filed Weights    08/24/21 0617 08/24/21 0811 08/24/21 1147  Weight: (!) 152 kg (!) 152 kg (!) 151 kg    Examination: General exam: Appears calm and comfortable, morbidly obese Respiratory system: Clear to auscultation. Respiratory effort normal. Cardiovascular system: S1 & S2 heard, RRR. No JVD, murmurs, rubs, gallops or clicks. No pedal edema. Gastrointestinal system: Abdomen is nondistended, soft and nontender. No organomegaly or masses felt. Normal bowel sounds heard. Central nervous system: Alert and oriented. No focal neurological deficits. Extremities: Right AKA Skin: No rashes, lesions or ulcers.  Psychiatry: Judgement and insight appear normal. Mood & affect appropriate.    Data Reviewed: I have personally reviewed following labs and imaging studies  CBC: Recent Labs  Lab 08/20/21 0735 08/24/21 0834  WBC 21.1* 18.1*  NEUTROABS  --  13.7*  HGB 7.3* 7.0*  HCT 26.4* 24.3*  MCV 98.5 98.4  PLT 348 347    Basic Metabolic Panel: Recent Labs  Lab 08/20/21 0735 08/24/21 0835  NA 136 134*  K 4.4 4.2  CL 98 96*  CO2 26 28  GLUCOSE 162* 111*  BUN 50* 27*  CREATININE 11.16* 7.33*  CALCIUM 7.9* 8.0*  MG  --  2.1  PHOS 5.9* 5.1*    GFR: Estimated Creatinine Clearance: 13.6 mL/min (A) (by C-G formula based on SCr of 7.33 mg/dL (H)). Liver Function Tests: Recent Labs  Lab 08/20/21 0735 08/24/21 0835  ALBUMIN 2.5* 2.5*    No results for input(s): LIPASE, AMYLASE in the last 168 hours. No results for input(s): AMMONIA in the last 168 hours. Coagulation Profile: No results for input(s): INR, PROTIME in the last 168 hours. Cardiac Enzymes: No results for input(s): CKTOTAL, CKMB, CKMBINDEX, TROPONINI in the last 168 hours.  BNP (last 3 results) No results for input(s): PROBNP in the last 8760 hours. HbA1C: No results for input(s): HGBA1C in the last 72 hours. CBG: Recent Labs  Lab 08/23/21 1138 08/23/21 1622 08/23/21 2114 08/24/21 0613 08/24/21 1325  GLUCAP 112* 93 149*  134* 109*    Lipid Profile: No results for input(s): CHOL, HDL, LDLCALC, TRIG, CHOLHDL, LDLDIRECT in the last 72 hours.  Thyroid Function Tests: No results for input(s): TSH, T4TOTAL, FREET4, T3FREE, THYROIDAB in the last 72 hours. Anemia Panel: No results for input(s): VITAMINB12, FOLATE, FERRITIN, TIBC, IRON, RETICCTPCT in the last 72 hours.  Sepsis Labs: No results for input(s): PROCALCITON, LATICACIDVEN in the last 168 hours.  No results found for this or any previous visit (from the past 240 hour(s)).       Radiology Studies: No results found.      Scheduled Meds:  amLODipine  5 mg Oral Daily   aspirin EC  81 mg Oral Q breakfast   atorvastatin  10 mg Oral QHS   calcitRIOL  1 mcg Oral Q T,Th,Sa-HD   calcium carbonate  1,500 mg Oral TID with meals   calcium carbonate  1.5 tablet Oral With snacks   carvedilol  6.25 mg Oral BID WC   darbepoetin (ARANESP)  injection - DIALYSIS  200 mcg Intravenous Q Tue-HD   docusate sodium  100 mg Oral BID   feeding supplement (NEPRO CARB STEADY)  237 mL Oral TID WC   gabapentin  100 mg Oral BID   heparin injection (subcutaneous)  5,000 Units Subcutaneous Q8H   insulin aspart  0-6 Units Subcutaneous TID WC   insulin glargine-yfgn  10 Units Subcutaneous Daily   lidocaine  1 patch Transdermal Q24H   lisinopril  20 mg Oral Daily   mupirocin ointment  1 application. Topical BID   nystatin   Topical TID   pantoprazole  40 mg Oral Daily   polyethylene glycol  17 g Oral Daily   sodium chloride flush  10-40 mL Intracatheter Q12H   sodium chloride flush  3 mL Intravenous Q12H   Continuous Infusions:  sodium chloride     ferric gluconate (FERRLECIT) IVPB Stopped (08/23/21 1226)   magnesium sulfate bolus IVPB      Darliss Cheney, MD [facility] 08/24/2021, 1:28 PM

## 2021-08-24 NOTE — Progress Notes (Signed)
Pt refused CBG.

## 2021-08-24 NOTE — Progress Notes (Signed)
PT Cancellation Note  Patient Details Name: Sully Manzi MRN: [id number] DOB: 13-Jan-1971   Cancelled Treatment:    Reason Eval/Treat Not Completed: (P) Patient at procedure or test/unavailable (pt at HD dept -AM). Making attempt to have pt seen by therapies later in the day when pt available. Will continue efforts per PT plan of care.   Kara Pacer Destinie Thornsberry 08/24/2021, 9:35 AM

## 2021-08-24 NOTE — Plan of Care (Signed)
  Problem: Health Behavior/Discharge Planning: Goal: Ability to manage health-related needs will improve Outcome: Progressing   Problem: Clinical Measurements: Goal: Ability to maintain clinical measurements within normal limits will improve Outcome: Progressing Goal: Will remain free from infection Outcome: Progressing   Problem: Activity: Goal: Risk for activity intolerance will decrease Outcome: Progressing   Problem: Pain Managment: Goal: General experience of comfort will improve Outcome: Progressing   

## 2021-08-25 DIAGNOSIS — M79673 Pain in unspecified foot: Secondary | ICD-10-CM | POA: Diagnosis not present

## 2021-08-25 DIAGNOSIS — I998 Other disorder of circulatory system: Secondary | ICD-10-CM | POA: Diagnosis not present

## 2021-08-25 LAB — CBC
HCT: 24.4 % — ABNORMAL LOW (ref 36.0–46.0)
Hemoglobin: 7.2 g/dL — ABNORMAL LOW (ref 12.0–15.0)
MCH: 28.8 pg (ref 26.0–34.0)
MCHC: 29.5 g/dL — ABNORMAL LOW (ref 30.0–36.0)
MCV: 97.6 fL (ref 80.0–100.0)
Platelets: 259 10*3/uL (ref 150–400)
RBC: 2.5 MIL/uL — ABNORMAL LOW (ref 3.87–5.11)
RDW: 18.4 % — ABNORMAL HIGH (ref 11.5–15.5)
WBC: 17.3 10*3/uL — ABNORMAL HIGH (ref 4.0–10.5)
nRBC: 0.2 % (ref 0.0–0.2)

## 2021-08-25 LAB — GLUCOSE, CAPILLARY
Glucose-Capillary: 111 mg/dL — ABNORMAL HIGH (ref 70–99)
Glucose-Capillary: 113 mg/dL — ABNORMAL HIGH (ref 70–99)
Glucose-Capillary: 148 mg/dL — ABNORMAL HIGH (ref 70–99)
Glucose-Capillary: 129 mg/dL — ABNORMAL HIGH (ref 70–99)

## 2021-08-25 LAB — MAGNESIUM: Magnesium: 2 mg/dL (ref 1.7–2.4)

## 2021-08-25 NOTE — Progress Notes (Signed)
PROGRESS NOTE    Courtney Gray  XBJ:[id number] DOB: 07-17-1970 DOA: 08/03/2021 PCP: [facility]   Brief Narrative:  51 y.o. female with medical history significant of end-stage renal disease on hemodialysis, gout, diabetes mellitus type II, hypertension who just discharged from rehab this past Monday and was told that she had run out of her days and that she needed to pay out-of-pocket for any further rehab.  She presented again with worsening right foot pain and there was a concern for ischemic foot.  She was admitted and transferred to [facility].  CT angiogram was inconclusive.  She underwent arteriogram on 08/07/2021.  Seen by podiatry as well as vascular surgery.  Eventually underwent right AKA.  She is hemodialysis patient.     Assessment & Plan:   Ischemic right foot -underwent AKA on 08/14/2021.  Her pain is controlled on current medications.  Ulcer left middle finger:  -Apparently, patient had developed this ulcer on the left middle finger even before admission this time but she never mentioned this to anyone.   -She has been applying cream on this and according to her, it has been healing.  -x ray x 2 does not show osteomyelitis -per vascular:  discontinue ointment; start betadine paint every shift; plan was to allow patient to recover from R AKA prior to considering L arm angiography as an outpatient.  CT of the left hand is ordered since 08/22/2021 but is not completed yet and according to vascular note, patient has refused CT scan multiple times, they will arrange outpatient follow-up for this.  Left second toe pain: Per vascular note, patient did have black discoloration since admission but that is worse.  Patient herself has not mentioned to me before until this morning.  On examination, she does appear to have brown/black left second toe which is tender to palpation but is cold instead of warm.  Patient is convinced that this is gout.  She was informed and  educated that this is more likely a vascular issue as vascular surgery has documented and does not fit criteria for gout diagnosis.  Patient very emotional and concerned about her left lower extremity as she has been told that she may not have any option for revascularization.  She is worried that she may end up losing her left foot as well.  End-stage renal disease on hemodialysis -Nephrology following.  Dialysis as per nephrology schedule  Chronic respiratory failure with hypoxia -Chronically on 2-3 L oxygen via nasal cannula.  Respiratory status currently stable  Diabetes mellitus type 2 with hypoglycemia -A1c 5.7. -Glipizide and Lantus were discontinued during last hospitalization because of episodes of hypoglycemia.  On Semglee 10 units and SSI.  Blood sugar controlled.  Essential hypertension:  -Continue amlodipine and Coreg.  Acute on chronic anemia of chronic kidney disease: Hemoglobin is drifting down.  Currently 7.0.  Recheck in the morning.  If 7 or below, will transfuse.  Hyperlipidemia -continue statin  Hypersomnolence -Likely due to combination of illness and medications.  Gabapentin dose has been decreased.  Judicious use of narcotics.  Feeling much better and fully awake and alert Since 08/10/2021.  History of hemorrhoids -Stable  Questionable history of gout -apparently treated for gout of her right foot recently  Morbid obesity:  Estimated body mass index is 55.4 kg/m as calculated from the following:   Height as of this encounter: 5\' 5"  (1.651 m).   Weight as of this encounter: 151 kg.    DVT prophylaxis: Subcutaneous heparin  code Status: Full Family Communication: None at bedside Disposition Plan: Status is: Inpatient Remains inpatient appropriate because:   Waiting for DME to be delivered to her home.   Consultants: Vascular surgery/podiatry/nephrology  Procedures: Left lower extremity arteriogram on 08/07/2021. Right AKA    Subjective:  Patient  seen and examined.  Apparently vascular surgery had seen her before I did this morning.  She was already emotional after talking to them.  She was complaining of left second toe pain and was convinced that this was gout.  Further discussion as above.  When I broached the topic of discharge and asked her if she is feeling ready to go home today as she had mentioned to the [facility] yesterday, she quickly became upset and said " I am not sure why you all are trying to discharge me home too soon, I have not received enough for physical therapy yet, I have not received any training about the [facility] lift and neither have by my daughter, I am not ready for discharge today, I will not be ready for discharge tomorrow or the day after".  She was reassured and counseled and was calmed down.   Objective: Vitals:   08/25/21 0400 08/25/21 0421 08/25/21 0806 08/25/21 1112  BP: (!) 134/59  (!) 149/97 (!) 140/58  Pulse: 77  85   Resp: 18  19 18   Temp: 98 F (36.7 C)  (!) 97.5 F (36.4 C) (!) 97.5 F (36.4 C)  TempSrc: Oral  Oral Oral  SpO2: 98%  100% 99%  Weight:  (!) 151 kg    Height:       No intake or output data in the 24 hours ending 08/25/21 1255  Filed Weights   08/24/21 0811 08/24/21 1147 08/25/21 0421  Weight: (!) 152 kg (!) 151 kg (!) 151 kg    Examination:  General exam: Appears calm and comfortable, morbidly obese Respiratory system: Clear to auscultation. Respiratory effort normal. Cardiovascular system: S1 & S2 heard, RRR. No JVD, murmurs, rubs, gallops or clicks. No pedal edema. Gastrointestinal system: Abdomen is nondistended, soft and nontender. No organomegaly or masses felt. Normal bowel sounds heard. Central nervous system: Alert and oriented. No focal neurological deficits. Extremities: Right AKA, staples in place.  Erythematous and edematous left second toe.  Tender to touch. Skin: No rashes, lesions or ulcers.  Psychiatry: Judgement and insight appear normal. Mood & affect  appropriate.    Data Reviewed: I have personally reviewed following labs and imaging studies  CBC: Recent Labs  Lab 08/20/21 0735 08/24/21 0834  WBC 21.1* 18.1*  NEUTROABS  --  13.7*  HGB 7.3* 7.0*  HCT 26.4* 24.3*  MCV 98.5 98.4  PLT 348 161    Basic Metabolic Panel: Recent Labs  Lab 08/20/21 0735 08/24/21 0835  NA 136 134*  K 4.4 4.2  CL 98 96*  CO2 26 28  GLUCOSE 162* 111*  BUN 50* 27*  CREATININE 11.16* 7.33*  CALCIUM 7.9* 8.0*  MG  --  2.1  PHOS 5.9* 5.1*    GFR: Estimated Creatinine Clearance: 13.6 mL/min (A) (by C-G formula based on SCr of 7.33 mg/dL (H)). Liver Function Tests: Recent Labs  Lab 08/20/21 0735 08/24/21 0835  ALBUMIN 2.5* 2.5*    No results for input(s): LIPASE, AMYLASE in the last 168 hours. No results for input(s): AMMONIA in the last 168 hours. Coagulation Profile: No results for input(s): INR, PROTIME in the last 168 hours. Cardiac Enzymes: No results for input(s): CKTOTAL, CKMB, CKMBINDEX,  TROPONINI in the last 168 hours.  BNP (last 3 results) No results for input(s): PROBNP in the last 8760 hours. HbA1C: No results for input(s): HGBA1C in the last 72 hours. CBG: Recent Labs  Lab 08/24/21 0613 08/24/21 1325 08/24/21 2120 08/25/21 0702 08/25/21 1120  GLUCAP 134* 109* 154* 111* 113*    Lipid Profile: No results for input(s): CHOL, HDL, LDLCALC, TRIG, CHOLHDL, LDLDIRECT in the last 72 hours.  Thyroid Function Tests: No results for input(s): TSH, T4TOTAL, FREET4, T3FREE, THYROIDAB in the last 72 hours. Anemia Panel: No results for input(s): VITAMINB12, FOLATE, FERRITIN, TIBC, IRON, RETICCTPCT in the last 72 hours.  Sepsis Labs: No results for input(s): PROCALCITON, LATICACIDVEN in the last 168 hours.  No results found for this or any previous visit (from the past 240 hour(s)).       Radiology Studies: No results found.      Scheduled Meds:  amLODipine  5 mg Oral Daily   aspirin EC  81 mg Oral Q  breakfast   atorvastatin  10 mg Oral QHS   calcitRIOL  1 mcg Oral Q T,Th,Sa-HD   calcium carbonate  1,500 mg Oral TID with meals   calcium carbonate  1.5 tablet Oral With snacks   carvedilol  6.25 mg Oral BID WC   darbepoetin (ARANESP) injection - DIALYSIS  200 mcg Intravenous Q Tue-HD   docusate sodium  100 mg Oral BID   feeding supplement (NEPRO CARB STEADY)  237 mL Oral TID WC   gabapentin  100 mg Oral BID   heparin injection (subcutaneous)  5,000 Units Subcutaneous Q8H   insulin aspart  0-6 Units Subcutaneous TID WC   insulin glargine-yfgn  10 Units Subcutaneous Daily   lidocaine  1 patch Transdermal Q24H   lisinopril  20 mg Oral Daily   nystatin   Topical TID   pantoprazole  40 mg Oral Daily   polyethylene glycol  17 g Oral Daily   sodium chloride flush  10-40 mL Intracatheter Q12H   sodium chloride flush  3 mL Intravenous Q12H   Continuous Infusions:  sodium chloride     ferric gluconate (FERRLECIT) IVPB 125 mg (08/24/21 1849)   magnesium sulfate bolus IVPB      Darliss Cheney, MD [facility] 08/25/2021, 12:55 PM

## 2021-08-25 NOTE — Progress Notes (Signed)
[facility] Progress Note   Subjective:   Signed off HD early yesterday, said she was cold and in pain but no cramping or HD-related concerns. Denies SOB, CP, dizziness, abdominal pain and nausea.   Objective Vitals:   08/24/21 2319 08/25/21 0400 08/25/21 0421 08/25/21 0806  BP: (!) 153/72 (!) 134/59  (!) 149/97  Pulse: 87 77  85  Resp: 17 18  19   Temp:  98 F (36.7 C)  (!) 97.5 F (36.4 C)  TempSrc:  Oral  Oral  SpO2: 97% 98%  100%  Weight:   (!) 151 kg   Height:       Physical Exam General: Obese female, alert and in NAD Heart: RRR, no murmurs, rubs or gallops Lungs: CTA bilaterally without wheezing, rhonchi or rales. Breath sounds diminished throughout Abdomen: Soft, non-distended, +BS Extremities: No pitting edema LLE. R AKA Dialysis Access: R IJ TDC with dry intact bandage  Additional Objective Labs: Basic Metabolic Panel: Recent Labs  Lab 08/20/21 0735 08/24/21 0835  NA 136 134*  K 4.4 4.2  CL 98 96*  CO2 26 28  GLUCOSE 162* 111*  BUN 50* 27*  CREATININE 11.16* 7.33*  CALCIUM 7.9* 8.0*  PHOS 5.9* 5.1*   Liver Function Tests: Recent Labs  Lab 08/20/21 0735 08/24/21 0835  ALBUMIN 2.5* 2.5*   No results for input(s): LIPASE, AMYLASE in the last 168 hours. CBC: Recent Labs  Lab 08/20/21 0735 08/24/21 0834  WBC 21.1* 18.1*  NEUTROABS  --  13.7*  HGB 7.3* 7.0*  HCT 26.4* 24.3*  MCV 98.5 98.4  PLT 348 253   Blood Culture    Component Value Date/Time   SDES BLOOD LEFT HAND 09/03/2020 2359   SPECREQUEST  09/03/2020 2359    BOTTLES DRAWN AEROBIC AND ANAEROBIC Blood Culture adequate volume   CULT  09/03/2020 2359    NO GROWTH 5 DAYS Performed at [facility], [address], [place], [place] [address]    REPTSTATUS 09/09/2020 FINAL 09/03/2020 2359    Cardiac Enzymes: No results for input(s): CKTOTAL, CKMB, CKMBINDEX, TROPONINI in the last 168 hours. CBG: Recent Labs  Lab 08/23/21 2114 08/24/21 0613 08/24/21 1325  08/24/21 2120 08/25/21 0702  GLUCAP 149* 134* 109* 154* 111*   Iron Studies: No results for input(s): IRON, TIBC, TRANSFERRIN, FERRITIN in the last 72 hours. @lablastinr3 @ Studies/Results: No results found. Medications:  sodium chloride     ferric gluconate (FERRLECIT) IVPB 125 mg (08/24/21 1849)   magnesium sulfate bolus IVPB      amLODipine  5 mg Oral Daily   aspirin EC  81 mg Oral Q breakfast   atorvastatin  10 mg Oral QHS   calcitRIOL  1 mcg Oral Q T,Th,Sa-HD   calcium carbonate  1,500 mg Oral TID with meals   calcium carbonate  1.5 tablet Oral With snacks   carvedilol  6.25 mg Oral BID WC   darbepoetin (ARANESP) injection - DIALYSIS  200 mcg Intravenous Q Tue-HD   docusate sodium  100 mg Oral BID   feeding supplement (NEPRO CARB STEADY)  237 mL Oral TID WC   gabapentin  100 mg Oral BID   heparin injection (subcutaneous)  5,000 Units Subcutaneous Q8H   insulin aspart  0-6 Units Subcutaneous TID WC   insulin glargine-yfgn  10 Units Subcutaneous Daily   lidocaine  1 patch Transdermal Q24H   lisinopril  20 mg Oral Daily   nystatin   Topical TID   pantoprazole  40 mg Oral Daily  polyethylene glycol  17 g Oral Daily   sodium chloride flush  10-40 mL Intracatheter Q12H   sodium chloride flush  3 mL Intravenous Q12H    Dialysis Orders: [name]  4h 27min  165kg  1K/2.5Ca bath  TDC  400 BFR  - 4/29 > HBsAg neg w/ Abs < 10  - mircera 200ug q2  - calcitriol 1 ug tiw  - sensipar 30 mg tiw  Assessment/Plan: Bilateral foot pain - w/ ischemic changes R foot+ gangrenous toes x VVS consulted. S/p Bilat LE angiogram 5/3 per Dr. Virl Cagey note - severe calcific disease bilaterally.  Underwent AKA on 5/10. Per VVS/primary team Pain in R middle finger non healing ulcer w/surrounding erythema/edema.  Xray with no acute findings.  VVS recommends betadine wash every shift. Possible angiography as outpatient of L arm once patient recovered from R AKA.   ESRD - on HD TTS. Signed off  early yesterday but no specific HD issues. Continue TTS schedule  Volume - appears euvolemic, Will need new EDW on d/c. ~151kg.  HTN - Blood pressure well controlled overall, moderately elevated pre-HD. Home meds per pmd as needed. Currently on amlodipine 5mg  qd, carvedilol 6.25mg  BID, and lisinopril 20mg  qd.   Anemia ckd - last Hgb 7.0. Receiving aranesp q Tuesday. Iron sat 20% - given dose of IV Fe. Transfuse PRN for Hgb <7 MBD ckd - CCa and phos controlled. Sensipar has on hold d/t hypocalcemia which is improving. Cont vdra w/ HD tiw. On tums as binder.  Chronic hypoxic respiratory failure - on home oxygen  Nutrition- Renal diet w/fluid restrictions.  Alb 2.5. Ordered Nepro supplement but gives diarrhea so not drinking.  Daughter can bring protein bar. Dispo - Now planning to d/c home, says she is waiting for equipment     Anice Paganini, PA-C 08/25/2021, 9:43 AM  [facility] Pager: [phone]

## 2021-08-25 NOTE — Progress Notes (Signed)
  Progress Note    08/25/2021 8:43 AM 11 Days Post-Op  Subjective:  asked by RN to see patient for continued left toe pain, redness, swelling. This apparently has been present since admission but at far lesser degree then right toes. Most of tenderness is in the left 2nd toe. No hurting all the time but tender to touch. Concerned that she was told previously that she had gout in both feet but then developed gangrene on her right   Vitals:   08/25/21 0400 08/25/21 0806  BP: (!) 134/59 (!) 149/97  Pulse: 77 85  Resp: 18 19  Temp: 98 F (36.7 C) (!) 97.5 F (36.4 C)  SpO2: 98% 100%   Physical Exam: Cardiac:  regular Lungs:  non labored, [place] Incisions:  Right AKA staples intact Extremities:  LLE warm. There is some tenderness to palpation of left 2nd and 1st toes. Mildly erythematous and swollen. No tissue loss. Motor and  sensation intact Abdomen:  obese Neurologic: alert and oriented  CBC    Component Value Date/Time   WBC 18.1 (H) 08/24/2021 0834   RBC 2.47 (L) 08/24/2021 0834   HGB 7.0 (L) 08/24/2021 0834   HCT 24.3 (L) 08/24/2021 0834   PLT 253 08/24/2021 0834   MCV 98.4 08/24/2021 0834   MCH 28.3 08/24/2021 0834   MCHC 28.8 (L) 08/24/2021 0834   RDW 18.6 (H) 08/24/2021 0834   LYMPHSABS 1.4 08/24/2021 0834   MONOABS 2.0 (H) 08/24/2021 0834   EOSABS 0.3 08/24/2021 0834   BASOSABS 0.1 08/24/2021 0834    BMET    Component Value Date/Time   NA 134 (L) 08/24/2021 0835   K 4.2 08/24/2021 0835   CL 96 (L) 08/24/2021 0835   CO2 28 08/24/2021 0835   GLUCOSE 111 (H) 08/24/2021 0835   BUN 27 (H) 08/24/2021 0835   CREATININE 7.33 (H) 08/24/2021 0835   CALCIUM 8.0 (L) 08/24/2021 0835   GFRNONAA 6 (L) 08/24/2021 0835   GFRAA 6 (L) 11/29/2019 0120    INR    Component Value Date/Time   INR 1.3 (H) 07/10/2021 0627     Intake/Output Summary (Last 24 hours) at 08/25/2021 0843 Last data filed at 08/24/2021 1147 Gross per 24 hour  Intake --  Output 1084 ml  Net -1084  ml     Assessment/Plan:  51 y.o. female is s/p Right AKA 11 Days Post-Op   Right AKA well appearing Has follow up already arranged on 6/13 for staple removal Left 3rd finger stable. She continues to have concerns about dark appearance CT of left hand pending as she has refused several times. Can arrange angiography as outpatient for further evaluation. Hand is warm with 1+ radial pulse Had a very long discussion with patient regarding her LLE perfusion at that she has single vessel peroneal runoff She likely is at high risk for worsening symptoms of left foot. Discussed with patient that this is not gout but a vascular issue. At this time she is without any rest pain or tissue loss Discussed continuing to monitor her left foot for rest pain or tissue loss. She does not have any revascularization options either open or endovascular   Karoline Caldwell, PA-C [facility] [phone] 08/25/2021 8:43 AM

## 2021-08-26 DIAGNOSIS — M79673 Pain in unspecified foot: Secondary | ICD-10-CM | POA: Diagnosis not present

## 2021-08-26 DIAGNOSIS — I998 Other disorder of circulatory system: Secondary | ICD-10-CM | POA: Diagnosis not present

## 2021-08-26 LAB — GLUCOSE, CAPILLARY
Glucose-Capillary: 117 mg/dL — ABNORMAL HIGH (ref 70–99)
Glucose-Capillary: 141 mg/dL — ABNORMAL HIGH (ref 70–99)
Glucose-Capillary: 82 mg/dL (ref 70–99)
Glucose-Capillary: 124 mg/dL — ABNORMAL HIGH (ref 70–99)

## 2021-08-26 LAB — MAGNESIUM: Magnesium: 2.1 mg/dL (ref 1.7–2.4)

## 2021-08-26 NOTE — Plan of Care (Signed)
  Problem: Health Behavior/Discharge Planning: Goal: Ability to manage health-related needs will improve Outcome: Progressing   

## 2021-08-26 NOTE — Care Management Important Message (Signed)
Important Message  Patient Details  Name: Courtney Gray MRN: [id number] Date of Birth: Sep 05, 1970   Medicare Important Message Given:  Yes     Shelda Altes 08/26/2021, 11:43 AM

## 2021-08-26 NOTE — Progress Notes (Signed)
PROGRESS NOTE    Courtney Gray  HUT:[id number] DOB: [birth year] DOA: 08/03/2021 PCP: [facility]   Brief Narrative:  51 y.o. female with medical history significant of end-stage renal disease on hemodialysis, gout, diabetes mellitus type II, hypertension who just discharged from rehab this past Monday and was told that she had run out of her days and that she needed to pay out-of-pocket for any further rehab.  She presented again with worsening right foot pain and there was a concern for ischemic foot.  She was admitted and transferred to [facility].  CT angiogram was inconclusive.  She underwent arteriogram on 08/07/2021.  Seen by podiatry as well as vascular surgery.  Eventually underwent right AKA.  She is hemodialysis patient.     Assessment & Plan:   Ischemic right foot -underwent AKA on 08/14/2021.  Her pain is controlled on current medications.  Ulcer left middle finger:  -Apparently, patient had developed this ulcer on the left middle finger even before admission this time but she never mentioned this to anyone.   -She has been applying cream on this and according to her, it has been healing.  -x ray x 2 does not show osteomyelitis -per vascular:  discontinue ointment; start betadine paint every shift; plan was to allow patient to recover from R AKA prior to considering L arm angiography as an outpatient.  CT of the left hand is ordered since 08/22/2021 but is not completed yet and according to vascular note, patient has refused CT scan multiple times, they will arrange outpatient follow-up for this.  Left second toe pain: Per vascular note, patient did have black discoloration since admission but that is worse. On examination, she does appear to have brown/black left second toe which is tender to palpation but is cold instead of warm.  Patient is convinced that this is gout.  She was informed and educated that this is more likely a vascular issue as vascular surgery has  documented and does not fit criteria for gout diagnosis.  Management per vascular surgery.  End-stage renal disease on hemodialysis -Nephrology following.  Dialysis as per nephrology schedule  Chronic respiratory failure with hypoxia -Chronically on 2-3 L oxygen via nasal cannula.  Respiratory status currently stable  Diabetes mellitus type 2 with hypoglycemia -A1c 5.7. -Glipizide and Lantus were discontinued during last hospitalization because of episodes of hypoglycemia.  On Semglee 10 units and SSI.  Blood sugar controlled.  Essential hypertension:  -Continue amlodipine and Coreg.  Acute on chronic anemia of chronic kidney disease: Hemoglobin is drifting down.  Currently 7.0.  Recheck in the morning.  If 7 or below, will transfuse.  Hyperlipidemia -continue statin  Hypersomnolence -Likely due to combination of illness and medications.  Gabapentin dose has been decreased.  Judicious use of narcotics.  Feeling much better and fully awake and alert Since 08/10/2021.  History of hemorrhoids -Stable  Questionable history of gout -apparently treated for gout of her right foot recently  Morbid obesity:  Estimated body mass index is 57.67 kg/m as calculated from the following:   Height as of this encounter: 5\' 5"  (1.651 m).   Weight as of this encounter: 157.2 kg.    DVT prophylaxis: Subcutaneous heparin  code Status: Full Family Communication: None at bedside Disposition Plan: Status is: Inpatient Remains inpatient appropriate because: Per TOC, all DME has been delivered to home.  Patient continues to complain that she has not received enough physical therapy, she needs to be trained and her daughter  also needs to be trained for her left before she would feel comfortable going home.   Consultants: Vascular surgery/podiatry/nephrology  Procedures: Left lower extremity arteriogram on 08/07/2021. Right AKA    Subjective:  Seen and examined.  She is upset that she was told  that she is going to be discharged today.  She was still complaining of left second toe pain.  Objective: Vitals:   08/25/21 2059 08/26/21 0636 08/26/21 0807 08/26/21 0906  BP: 127/66 (!) 143/59    Pulse: 85 85  86  Resp: 18 15 20    Temp: 98.5 F (36.9 C) 98.8 F (37.1 C)  98.4 F (36.9 C)  TempSrc: Oral Oral  Axillary  SpO2: 100% 100%    Weight:  (!) 157.2 kg    Height:        Intake/Output Summary (Last 24 hours) at 08/26/2021 1138 Last data filed at 08/25/2021 1300 Gross per 24 hour  Intake 120 ml  Output --  Net 120 ml    Filed Weights   08/24/21 1147 08/25/21 0421 08/26/21 0636  Weight: (!) 151 kg (!) 151 kg (!) 157.2 kg    Examination:  General exam: Appears calm and comfortable, morbidly obese Respiratory system: Clear to auscultation. Respiratory effort normal. Cardiovascular system: S1 & S2 heard, RRR. No JVD, murmurs, rubs, gallops or clicks. No pedal edema. Gastrointestinal system: Abdomen is nondistended, soft and nontender. No organomegaly or masses felt. Normal bowel sounds heard. Central nervous system: Alert and oriented. No focal neurological deficits. Extremities: Right AKA Skin: Edema and erythema of the second left toe.  Ulcer in the finger in the left hand. Psychiatry: Judgement and insight appear normal. Mood upset.   Data Reviewed: I have personally reviewed following labs and imaging studies  CBC: Recent Labs  Lab 08/20/21 0735 08/24/21 0834 08/25/21 1254  WBC 21.1* 18.1* 17.3*  NEUTROABS  --  13.7*  --   HGB 7.3* 7.0* 7.2*  HCT 26.4* 24.3* 24.4*  MCV 98.5 98.4 97.6  PLT 348 253 811    Basic Metabolic Panel: Recent Labs  Lab 08/20/21 0735 08/24/21 0835 08/25/21 1254  NA 136 134*  --   K 4.4 4.2  --   CL 98 96*  --   CO2 26 28  --   GLUCOSE 162* 111*  --   BUN 50* 27*  --   CREATININE 11.16* 7.33*  --   CALCIUM 7.9* 8.0*  --   MG  --  2.1 2.0  PHOS 5.9* 5.1*  --     GFR: Estimated Creatinine Clearance: 13.9 mL/min (A)  (by C-G formula based on SCr of 7.33 mg/dL (H)). Liver Function Tests: Recent Labs  Lab 08/20/21 0735 08/24/21 0835  ALBUMIN 2.5* 2.5*    No results for input(s): LIPASE, AMYLASE in the last 168 hours. No results for input(s): AMMONIA in the last 168 hours. Coagulation Profile: No results for input(s): INR, PROTIME in the last 168 hours. Cardiac Enzymes: No results for input(s): CKTOTAL, CKMB, CKMBINDEX, TROPONINI in the last 168 hours.  BNP (last 3 results) No results for input(s): PROBNP in the last 8760 hours. HbA1C: No results for input(s): HGBA1C in the last 72 hours. CBG: Recent Labs  Lab 08/25/21 0702 08/25/21 1120 08/25/21 1629 08/25/21 2105 08/26/21 0644  GLUCAP 111* 113* 148* 129* 117*    Lipid Profile: No results for input(s): CHOL, HDL, LDLCALC, TRIG, CHOLHDL, LDLDIRECT in the last 72 hours.  Thyroid Function Tests: No results for input(s): TSH, T4TOTAL, FREET4,  T3FREE, THYROIDAB in the last 72 hours. Anemia Panel: No results for input(s): VITAMINB12, FOLATE, FERRITIN, TIBC, IRON, RETICCTPCT in the last 72 hours.  Sepsis Labs: No results for input(s): PROCALCITON, LATICACIDVEN in the last 168 hours.  No results found for this or any previous visit (from the past 240 hour(s)).       Radiology Studies: No results found.      Scheduled Meds:  amLODipine  5 mg Oral Daily   aspirin EC  81 mg Oral Q breakfast   atorvastatin  10 mg Oral QHS   calcitRIOL  1 mcg Oral Q T,Th,Sa-HD   calcium carbonate  1,500 mg Oral TID with meals   calcium carbonate  1.5 tablet Oral With snacks   carvedilol  6.25 mg Oral BID WC   darbepoetin (ARANESP) injection - DIALYSIS  200 mcg Intravenous Q Tue-HD   docusate sodium  100 mg Oral BID   feeding supplement (NEPRO CARB STEADY)  237 mL Oral TID WC   gabapentin  100 mg Oral BID   heparin injection (subcutaneous)  5,000 Units Subcutaneous Q8H   insulin aspart  0-6 Units Subcutaneous TID WC   insulin glargine-yfgn  10  Units Subcutaneous Daily   lidocaine  1 patch Transdermal Q24H   lisinopril  20 mg Oral Daily   nystatin   Topical TID   pantoprazole  40 mg Oral Daily   polyethylene glycol  17 g Oral Daily   sodium chloride flush  10-40 mL Intracatheter Q12H   sodium chloride flush  3 mL Intravenous Q12H   Continuous Infusions:  sodium chloride     ferric gluconate (FERRLECIT) IVPB 125 mg (08/24/21 1849)   magnesium sulfate bolus IVPB      Darliss Cheney, MD [facility] 08/26/2021, 11:38 AM

## 2021-08-26 NOTE — Progress Notes (Addendum)
Physical Therapy Treatment Patient Details Name: Courtney Gray MRN: [id number] DOB: October 22, 1970 Today's Date: 08/26/2021   History of Present Illness Pt is a 51 y.o. female admitted 4/29 with ischemic R foot. 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 PMH: ESRD on HD, gout, DMII, HTN.    PT Comments    Pt received in supine, agreeable to therapy session with encouragement and emphasis on bed mobility and family training on placement of lift pad for mechanical lift transfers OOB. Mechanical lift likely to be safest method for pt/daughter to get her OOB to wheelchair/recliner at home and for hemodialysis appointments so session focus on body mechanics for rolling/repositioning, pressure relief, pursed-lip breathing given pain/anxiety during mobility tasks and pt did well after premedication by nursing and with daughter present via video call on pt's personal phone during transfer training. Pt also given HEP handouts to reinforce technique for slide board transfers to various surfaces once her pain in RLE is improved, for rolling in bed and for LE exercises in bed and seated exercises in chair. Pt daughter reports she is waiting at home today for arrival of technician to deliver mechanical lift and give her further intstruction. Pt continues to benefit from PT services to progress toward functional mobility goals, frequency increased to 3x/wk per discussion with PT until pt DC home, recommend max HH services to ensure pt safety at home as per case mgmt, SNF placement not currently available for her.    Recommendations for follow up therapy are one component of a multi-disciplinary discharge planning process, led by the attending physician.  Recommendations may be updated based on patient status, additional functional criteria and insurance authorization.  Follow Up Recommendations  Skilled nursing-short term rehab (<3 hours/day) (SNF would be best for further education/strengthening but per chart review pt difficult to  place, so recommend max HH services)     Assistance Recommended at Discharge Frequent or constant Supervision/Assistance  Patient can return home with the following Assistance with cooking/housework;Assist for transportation;Help with stairs or ramp for entrance;Two people to help with walking and/or transfers;Two people to help with bathing/dressing/bathroom;Direct supervision/assist for medications management   Equipment Recommendations  Hospital bed;Wheelchair (measurements PT);Other (comment) (all bariatric equipment; mechanical (hoyer) lift, slide board)    Recommendations for Other Services       Precautions / Restrictions Precautions Precautions: Fall;Other (comment) Precaution Comments: R AKA, self-limiting, L finger ulcer, possible hemorrhoid Restrictions Weight Bearing Restrictions: Yes RLE Weight Bearing: Non weight bearing     Mobility  Bed Mobility Overal bed mobility: Needs Assistance Bed Mobility: Rolling, Supine to Sit, Sit to Supine Rolling: Min assist, +2 for safety/equipment   Supine to sit: HOB elevated, Modified independent (Device/Increase time) Sit to supine: Modified independent (Device/Increase time)   General bed mobility comments: Pt performs supine<>long sit prior to rolling for lift pad placement; pt needing +1 physical assist mostly for cues for environmental awareness/body mechanics in narrow bed due to body habitus and second person present for safety/efficiency but not needing to physically assist her to roll.    Transfers Overall transfer level: Needs assistance   Transfers: Bed to chair/wheelchair/BSC             General transfer comment: Pt agreeable it will be safer for her and daughter to use mechanical lift for OOB transfers to her WC initially due to limited progress so far in seated scooting. Pt tolerated mechanical lift well and needed cues for pursed-lip breathing/safe body mechanics during transfer from bed>chair. Transfer via  Lift Equipment:  Maximove       Balance Overall balance assessment: Needs assistance Sitting-balance support: No upper extremity supported, Feet unsupported Sitting balance-[name] Scale: Good Sitting balance - Comments: able to perform self-care tasks unsupported in long sit with no LOB   Standing balance support: Bilateral upper extremity supported, During functional activity, Reliant on assistive device for balance Standing balance-[name] Scale: Fair Standing balance comment: pt unable to attempt due to pain           Cognition Arousal/Alertness: Awake/alert Behavior During Therapy: Anxious Overall Cognitive Status: Within Functional Limits for tasks assessed          General Comments: Anxiety and slow progressing, pt participates better with daughter on video conference to reassure her.        Exercises Other Exercises Other Exercises: HEP handout link: [email or web address]    Access Code: [email or web address] Other Exercises: pt also given printed instructions on rolling, seated scooting with SB for transfer to wheelchair, car and shower bench along with exercise packet in link above to reinforce all activities discussed with PT/OT during hospital stay. No handout available on mechanical lift transfers but company delivering equipment can review this technique with pt's caregiver, she was encouraged to ask questions and perform demo for teachback/understanding.    General Comments General comments (skin integrity, edema, etc.): HR/SpO2 WFL on 3L O2 [place], pt BP slightly soft when taken just prior to session but pt without report of dizziness during transfers/sitting upright and not dyspneic.      Pertinent Vitals/Pain Pain Assessment Pain Assessment: Faces Faces Pain Scale: Hurts little more Pain Location: R residual limb and L 2nd-3rd toes Pain Descriptors / Indicators: Grimacing, Guarding, Discomfort Pain Intervention(s): Monitored during session, Premedicated before session,  Repositioned           PT Goals (current goals can now be found in the care plan section) Acute Rehab PT Goals Patient Stated Goal: decreased pain, increased independence PT Goal Formulation: With patient Potential to Achieve Goals: Fair Progress towards PT goals: Progressing toward goals    Frequency    Min 3X/week (increased as disposition now for home)      PT Plan Current plan remains appropriate       AM-PAC PT "6 Clicks" Mobility   Outcome Measure  Help needed turning from your back to your side while in a flat bed without using bedrails?: A Little Help needed moving from lying on your back to sitting on the side of a flat bed without using bedrails?: A Little Help needed moving to and from a bed to a chair (including a wheelchair)?: Total Help needed standing up from a chair using your arms (e.g., wheelchair or bedside chair)?: Total Help needed to walk in hospital room?: Total Help needed climbing 3-5 steps with a railing? : Total 6 Click Score: 10    End of Session Equipment Utilized During Treatment: Oxygen Activity Tolerance: Patient tolerated treatment well Patient left: in chair;with call bell/phone within reach;Other (comment) (lift pad underneath her for ease of return transfer and pillows under pt bottom in chair for comfort) Nurse Communication: Mobility status;Need for lift equipment;Other (comment) (pt encouraged to sit up in chair 1-2 hours to get comfortable with this posture for HD sessions) PT Visit Diagnosis: Other abnormalities of gait and mobility (R26.89);Muscle weakness (generalized) (M62.81);Difficulty in walking, not elsewhere classified (R26.2);Pain Pain - Right/Left: Right Pain - part of body: Leg     Time: 5462-7035 PT Time Calculation (min) (ACUTE ONLY): 38 min  Charges:  $  Therapeutic Activity: 23-37 mins $Self Care/Home Management: 8-22                     Houston Siren., PTA Acute Rehabilitation Services Secure Chat Preferred  9a-5:30pm Office: [place] 08/26/2021, 2:24 PM

## 2021-08-26 NOTE — TOC Progression Note (Signed)
Transition of Care (TOC) - Progression Note  Marvetta Gibbons RN, BSN Transitions of Care Unit 4E- RN Case Manager See Treatment Team for direct phone #    Patient Details  Name: Courtney Gray MRN: [id number] Date of Birth: 05-11-1970  Transition of Care [facility]) CM/SW Contact  Dahlia Client Romeo Rabon, RN Phone Number: 08/26/2021, 2:48 PM  Clinical Narrative:    Damaris Schooner with Mardene Celeste at [employer] this AM regarding [facility] [place] delivery. Per Mardene Celeste, they have received a shipment in this AM and will have lift to deliver to home today. Mardene Celeste is going to try to get one on the 12noon truck for this afternoon delivery.  Nursing staff working with PT to have daughter educated on [name].     Expected Discharge Plan: [place] Barriers to Discharge: No SNF bed, Other (must enter comment), Continued Medical Work up (Awaiting DME delivery of hospital bed)  Expected Discharge Plan and Services Expected Discharge Plan: [place] In-house Referral: Clinical Social Work Discharge Planning Services: CM Consult Post Acute Care Choice: Durable Medical Equipment, Home Health Living arrangements for the past 2 months: Apartment                 DME Arranged: Other see comment, Hospital bed (Slide board) DME Agency: [employer] Date DME Agency Contacted: 08/22/21 Time DME Agency Contacted: [phone] Representative spoke with at DME Agency: Jodell Cipro HH Arranged: PT, OT, Nurse's Aide [place] Agency: [place] Date [place]: 08/22/21 Time [place]: 33 Representative spoke with at [place]: [place] ([place]) Interventions    Readmission Risk Interventions    07/12/2021   12:38 PM 05/23/2021    1:12 PM  Readmission Risk Prevention Plan  Transportation Screening Complete Complete  Medication Review Press photographer) Complete Complete  PCP or Specialist appointment within 3-5 days of discharge Complete  Complete  HRI or Home Care Consult Complete Complete  SW Recovery Care/Counseling Consult Complete Complete  Palliative Care Screening Not Applicable Not [place] Complete Not Applicable

## 2021-08-26 NOTE — Progress Notes (Signed)
[facility] Progress Note   Subjective:   Patient seen and examined at bedside.  Reports nausea this AM, believes gastroparesis is acting up again.  Denies vomiting, diarrhea, CP, SOB and fatigue.   Objective Vitals:   08/25/21 2059 08/26/21 0636 08/26/21 0807 08/26/21 0906  BP: 127/66 (!) 143/59    Pulse: 85 85  86  Resp: 18 15 20    Temp: 98.5 F (36.9 C) 98.8 F (37.1 C)  98.4 F (36.9 C)  TempSrc: Oral Oral  Axillary  SpO2: 100% 100%    Weight:  (!) 157.2 kg    Height:       Physical Exam General:chronically ill appearing. Obese female in NAD Heart:RRR, no mrg Lungs:CTAB, no crackles, wheezing or rhonchi. Breath sounds diminished. Nml WOB on 3L O2 via [place] Abdomen:soft, NTND Extremities:R AKA, trace LLE edema, darkened toes on L foot Dialysis Access: [facility]   Filed Weights   08/24/21 1147 08/25/21 0421 08/26/21 0636  Weight: (!) 151 kg (!) 151 kg (!) 157.2 kg    Intake/Output Summary (Last 24 hours) at 08/26/2021 1125 Last data filed at 08/25/2021 1300 Gross per 24 hour  Intake 120 ml  Output --  Net 120 ml    Additional Objective Labs: Basic Metabolic Panel: Recent Labs  Lab 08/20/21 0735 08/24/21 0835  NA 136 134*  K 4.4 4.2  CL 98 96*  CO2 26 28  GLUCOSE 162* 111*  BUN 50* 27*  CREATININE 11.16* 7.33*  CALCIUM 7.9* 8.0*  PHOS 5.9* 5.1*   Liver Function Tests: Recent Labs  Lab 08/20/21 0735 08/24/21 0835  ALBUMIN 2.5* 2.5*   CBC: Recent Labs  Lab 08/20/21 0735 08/24/21 0834 08/25/21 1254  WBC 21.1* 18.1* 17.3*  NEUTROABS  --  13.7*  --   HGB 7.3* 7.0* 7.2*  HCT 26.4* 24.3* 24.4*  MCV 98.5 98.4 97.6  PLT 348 253 259    CBG: Recent Labs  Lab 08/25/21 0702 08/25/21 1120 08/25/21 1629 08/25/21 2105 08/26/21 0644  GLUCAP 111* 113* 148* 129* 117*    Medications:  sodium chloride     ferric gluconate (FERRLECIT) IVPB 125 mg (08/24/21 1849)   magnesium sulfate bolus IVPB      amLODipine  5 mg Oral Daily   aspirin  EC  81 mg Oral Q breakfast   atorvastatin  10 mg Oral QHS   calcitRIOL  1 mcg Oral Q T,Th,Sa-HD   calcium carbonate  1,500 mg Oral TID with meals   calcium carbonate  1.5 tablet Oral With snacks   carvedilol  6.25 mg Oral BID WC   darbepoetin (ARANESP) injection - DIALYSIS  200 mcg Intravenous Q Tue-HD   docusate sodium  100 mg Oral BID   feeding supplement (NEPRO CARB STEADY)  237 mL Oral TID WC   gabapentin  100 mg Oral BID   heparin injection (subcutaneous)  5,000 Units Subcutaneous Q8H   insulin aspart  0-6 Units Subcutaneous TID WC   insulin glargine-yfgn  10 Units Subcutaneous Daily   lidocaine  1 patch Transdermal Q24H   lisinopril  20 mg Oral Daily   nystatin   Topical TID   pantoprazole  40 mg Oral Daily   polyethylene glycol  17 g Oral Daily   sodium chloride flush  10-40 mL Intracatheter Q12H   sodium chloride flush  3 mL Intravenous Q12H    Dialysis Orders: [name] T[facility]  4h 71min  165kg  1K/2.5Ca bath  TDC  400 BFR  -  4/29 > HBsAg neg w/ Abs < 10  - mircera 200ug q2  - calcitriol 1 ug tiw  - sensipar 30 mg tiw   Assessment/Plan: Bilateral foot pain - w/ ischemic changes R foot+ gangrenous toes x VVS consulted. S/p Bilat LE angiogram 5/3 per Dr. Virl Cagey note - severe calcific disease bilaterally.  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 Darkened toes on left foot and high risk for worsened symptoms. Per VVS/primary team Pain in R middle finger non healing ulcer w/surrounding erythema/edema.  Xray with no acute findings.  VVS recommends betadine wash every shift. Possible angiography as outpatient of L arm once patient recovered from R AKA.   ESRD - on HD TTS. HD tomorrow per regular schedule.  Volume - appears euvolemic, Will need new EDW on d/c. ~151kg.  HTN - Blood pressure well controlled overall, intermittently elevated, especially piror to HD. Home meds per pmd as needed. Currently on amlodipine 5mg  qd, carvedilol 6.25mg  BID, and lisinopril 20mg  qd.   Anemia ckd - last Hgb 7.2.  Receiving aranesp q Tuesday. Iron sat 20% - given dose of IV Fe. Transfuse PRN for Hgb <7 MBD ckd - CCa and phos controlled. Sensipar has on hold d/t hypocalcemia which is improving. Cont vdra w/ HD TIW. On tums as binder.  Chronic hypoxic respiratory failure - on home oxygen  Nutrition- Renal diet w/fluid restrictions.  Alb 2.5. Ordered Nepro supplement but gives diarrhea so not drinking.  Daughter can bring protein bar. Dispo - Now planning to d/c home, says she is waiting for equipment   Jen Mow, PA-C [place] 08/26/2021,11:25 AM  LOS: 22 days

## 2021-08-26 NOTE — Progress Notes (Signed)
Mobility Specialist: Progress Note   08/26/21 1736  Mobility  Activity Transferred from chair to bed  Level of Assistance +2 (takes two people)  Assistive Device MaxiMove  RLE Weight Bearing NWB  Activity Response Tolerated well  $Mobility charge 1 Mobility   Pt assisted back to bed per request with help from RN. Pt back in bed with call bell and phone in reach. Bed alarm is on.   [facility] Kole Hilyard Mobility Specialist Mobility Specialist 5 North: [phone] Mobility Specialist 6 North: [phone]

## 2021-08-26 NOTE — Progress Notes (Signed)
Pt noted bandaging her R middle finger with gauze and red coban.  She is requesting more supplies for this purpose.

## 2021-08-27 DIAGNOSIS — M79673 Pain in unspecified foot: Secondary | ICD-10-CM | POA: Diagnosis not present

## 2021-08-27 DIAGNOSIS — I998 Other disorder of circulatory system: Secondary | ICD-10-CM | POA: Diagnosis not present

## 2021-08-27 LAB — CBC
HCT: 21.1 % — ABNORMAL LOW (ref 36.0–46.0)
Hemoglobin: 6.1 g/dL — CL (ref 12.0–15.0)
MCH: 28.6 pg (ref 26.0–34.0)
MCHC: 28.9 g/dL — ABNORMAL LOW (ref 30.0–36.0)
MCV: 99.1 fL (ref 80.0–100.0)
Platelets: 220 10*3/uL (ref 150–400)
RBC: 2.13 MIL/uL — ABNORMAL LOW (ref 3.87–5.11)
RDW: 18.7 % — ABNORMAL HIGH (ref 11.5–15.5)
WBC: 15 10*3/uL — ABNORMAL HIGH (ref 4.0–10.5)
nRBC: 0 % (ref 0.0–0.2)

## 2021-08-27 LAB — MAGNESIUM: Magnesium: 2.4 mg/dL (ref 1.7–2.4)

## 2021-08-27 LAB — ABO/RH: ABO/RH(D): O POS

## 2021-08-27 LAB — PREPARE RBC (CROSSMATCH)

## 2021-08-27 MED ORDER — ALBUMIN HUMAN 25 % IV SOLN
INTRAVENOUS | Status: AC
  Filled 2021-08-27: qty 100

## 2021-08-27 MED ORDER — ALBUMIN HUMAN 25 % IV SOLN
25.0000 g | Freq: Once | INTRAVENOUS | Status: AC
  Administered 2021-08-27: 25 g via INTRAVENOUS

## 2021-08-27 MED ORDER — FLUTICASONE PROPIONATE 50 MCG/ACT NA SUSP
2.0000 | Freq: Every day | NASAL | Status: DC
  Filled 2021-08-27: qty 16

## 2021-08-27 MED ORDER — HYDROMORPHONE HCL 1 MG/ML IJ SOLN
0.5000 mg | Freq: Four times a day (QID) | INTRAMUSCULAR | Status: DC | PRN
  Administered 2021-08-27 – 2021-08-28 (×2): 0.5 mg via INTRAVENOUS
  Filled 2021-08-27 (×2): qty 0.5

## 2021-08-27 MED ORDER — HEPARIN SODIUM (PORCINE) 1000 UNIT/ML IJ SOLN
INTRAMUSCULAR | Status: AC
Start: 2021-08-27 — End: 2021-08-27
  Administered 2021-08-27: 1000 [IU]
  Filled 2021-08-27: qty 4

## 2021-08-27 MED ORDER — SODIUM CHLORIDE 0.9% IV SOLUTION: Freq: Once | INTRAVENOUS | Status: AC

## 2021-08-27 MED ORDER — HYDROCODONE-ACETAMINOPHEN 5-325 MG PO TABS
1.0000 | ORAL_TABLET | ORAL | Status: DC | PRN
  Administered 2021-08-27 – 2021-08-28 (×4): 2 via ORAL
  Filled 2021-08-27 (×5): qty 2

## 2021-08-27 NOTE — TOC Progression Note (Signed)
Transition of Care (TOC) - Progression Note  Marvetta Gibbons RN, BSN Transitions of Care Unit 4E- RN Case Manager See Treatment Team for direct phone #    Patient Details  Name: Courtney Gray MRN: [id number] Date of Birth: [birth year]  Transition of Care [facility]) CM/SW Contact  Dahlia Client Romeo Rabon, RN Phone Number: 08/27/2021, 2:59 PM  Clinical Narrative:    CM spoke with pt at bedside regarding concerns about manual lift that was delivered to home. Per pt her daughter is unable to use lift without assistance, and pt is asking about electric lift.  Call made to [facility] with [employer] to ask about electric lift option- per Thedore Mins electric lifts are not typically covered by insurances, and pt would have an out of pocket cost per month. Have asked Zach/[employer] to f/u with pt regarding the out of pocket cost for her and discuss with her the options for electric lift and what she would like to do. If pt decides to go with electric lift then [employer] can change out the current lift. If pt decides to keep the current lift then [employer] can send out someone to give additional training on the lift so pt and daughter feel comfortable using it.  Zach to f/u with this writer once the contact pt.   Pt also asked about electric wheelchair for home, advised pt this is something she would need to f/u with her primary care doctor about as there is paperwork that is needed that is not done while pt is in the hospital.   Pt also discussed in LLOS/QC with [facility] leadership for support with existing barriers to discharge.   TOC to continue to follow.    Expected Discharge Plan: [place] Barriers to Discharge: No SNF bed, Other (must enter comment), Continued Medical Work up (Awaiting DME delivery of hospital bed)  Expected Discharge Plan and Services Expected Discharge Plan: [place] In-house Referral: Clinical Social Work Discharge Planning Services: CM Consult Post Acute Care Choice:  Durable Medical Equipment, Home Health Living arrangements for the past 2 months: Apartment                 DME Arranged: Other see comment, Hospital bed (Slide board) DME Agency: [employer] Date DME Agency Contacted: 08/22/21 Time DME Agency Contacted: [phone] Representative spoke with at DME Agency: Jodell Cipro HH Arranged: PT, OT, Nurse's Aide [place] Agency: [place] Date [place]: 08/22/21 Time [place]: 96 Representative spoke with at [place]: [place] ([place]) Interventions    Readmission Risk Interventions    07/12/2021   12:38 PM 05/23/2021    1:12 PM  Readmission Risk Prevention Plan  Transportation Screening Complete Complete  Medication Review Press photographer) Complete Complete  PCP or Specialist appointment within 3-5 days of discharge Complete Complete  HRI or Home Care Consult Complete Complete  SW Recovery Care/Counseling Consult Complete Complete  Palliative Care Screening Not Applicable Not [place] Complete Not Applicable

## 2021-08-27 NOTE — Progress Notes (Signed)
OT Cancellation Note  Patient Details Name: Courtney Gray MRN: [id number] DOB: 09-08-1970   Cancelled Treatment:    Reason Eval/Treat Not Completed: Patient at procedure or test/ unavailable (In HD) Lodema Hong, [place]  Pager [phone] Office [phone]  Trixie Dredge 08/27/2021, 7:47 AM

## 2021-08-27 NOTE — Progress Notes (Addendum)
PROGRESS NOTE    Courtney Gray  GYB:[id number] DOB: August 01, 1970 DOA: 08/03/2021 PCP: [facility], [facility]   Brief Narrative:  51 y.o. female with medical history significant of end-stage renal disease on hemodialysis, gout, diabetes mellitus type II, hypertension who just discharged from rehab this past Monday and was told that she had run out of her days and that she needed to pay out-of-pocket for any further rehab.  She presented again with worsening right foot pain and there was a concern for ischemic foot.  She was admitted and transferred to [facility].  CT angiogram was inconclusive.  She underwent arteriogram on 08/07/2021.  Seen by podiatry as well as vascular surgery.  Eventually underwent right AKA.  She is hemodialysis patient.     Assessment & Plan:   Ischemic right foot -underwent AKA on 08/14/2021.  Her pain is controlled on current medications.  Ulcer left middle finger:  -Apparently, patient had developed this ulcer on the left middle finger even before admission this time but she never mentioned this to anyone.   -She has been applying cream on this and according to her, it has been healing.  -x ray x 2 does not show osteomyelitis -per vascular:  discontinue ointment; start betadine paint every shift; plan was to allow patient to recover from R AKA prior to considering L arm angiography as an outpatient.  CT of the left hand is ordered since 08/22/2021 but is not completed yet and according to vascular note, patient has refused CT scan multiple times, they will arrange outpatient follow-up for this.  Left second toe pain: Per vascular note, patient did have black discoloration since admission but that is worse. On examination, she does appear to have brown/black left second toe which is tender to palpation but is cold instead of warm.  Patient is convinced that this is gout.  She was informed and educated that this is more likely a vascular issue as vascular surgery has  documented and does not fit criteria for gout diagnosis.  Management per vascular surgery.  End-stage renal disease on hemodialysis -Nephrology following.  Dialysis as per nephrology schedule  Chronic respiratory failure with hypoxia -Chronically on 2-3 L oxygen via nasal cannula.  Respiratory status currently stable  Diabetes mellitus type 2 with hypoglycemia -A1c 5.7. -Glipizide and Lantus were discontinued during last hospitalization because of episodes of hypoglycemia.  On Semglee 10 units and SSI.  Blood sugar controlled.  Essential hypertension:  -Continue amlodipine and Coreg.  Acute on chronic anemia of chronic kidney disease: Hemoglobin is drifting down.  Currently 7.0.  Recheck in the morning.  If 7 or below, will transfuse.  Hyperlipidemia -continue statin  Hypersomnolence -Likely due to combination of illness and medications.  Gabapentin dose has been decreased.  Judicious use of narcotics.  Feeling much better and fully awake and alert Since 08/10/2021.  History of hemorrhoids -Stable  Questionable history of gout -apparently treated for gout of her right foot recently  Disposition: SNF was recommended by PT OT.  She was declined by the only SNF that had initially offered a bed and now no other significant offered a bed.  Plan was discussed with the patient to discharge home with all the DME that she requires.  Reportedly, [place] lift was delivered yesterday.  Patient initially had concerns that she has not received any training and neither her daughter did and that is why she was not comfortable going home.  Per PT, patient did receive some training virtually and  her daughter also had received some training via FaceTime yesterday and Harrel Lemon lift has been arranged.  However patient now states that the [facility] lift is mechanical and requires more than 1 person and she does not have anyone else other than her daughter and patient does not think that it would work until she has  another and or the [place] lift can be replaced electrical.  Concerns conveyed to [facility] team.  PS: This patient was reviewed by [facility] committee and TOC leadership.  Patient has been medically optimized for the last few days for discharge, she has also received all the DME that was needed.  Per TOC, she has been trained and her daughter has been trained to use the [place] lift as well however patient demanded electrical left today.  Per TOC, that is going to be out-of-pocket for her which patient is not willing to pay.  The patient was reviewed by [facility] committee as well.  I was told by Dr. Vernell Leep to place the discharge order.  After my discussion with him, we decided to space out/wean her IV pain medications and transition her to oral pain medication and have a discussion with the patient today and set up expectations for discharge tomorrow.  I went to see the patient at around 2:45 PM, I also took the patient's nurse Sharyn Lull with me as a witness to the conversation.  We sat down in patient's room, I started talking to the patient explaining to everything that is documented above, patient became upset as expected, she resisted going home saying that she has not been trained on anything and that if she were to go home, she will be in the bed and will not be able to go to dialysis and eventually will come back to the hospital.  She also accused to me that I was being dishonest.  Primary RN was in the room as mentioned above.  She asked me to send the people are leadership or physician who is making the decision to discharge her.  She was informed that I am the attending physician and the only physician that will talk to her.  She then told me that she has already requested to replace me (which I confirmed with the nurse in front of the patient, she had not requested as such).  Then she demanded for physician to be replaced.  At that time we ended the conversation and came out of the room.  I have discussed the whole  situation with my leadership.  Morbid obesity:  Estimated body mass index is 56.17 kg/m as calculated from the following:   Height as of this encounter: 5\' 5"  (1.651 m).   Weight as of this encounter: 153.1 kg.    DVT prophylaxis: Subcutaneous heparin  code Status: Full Family Communication: None at bedside Disposition Plan: Status is: Inpatient Remains inpatient appropriate because: As discussed above.  Patient medically stable otherwise.  Consultants: Vascular surgery/podiatry/nephrology  Procedures: Left lower extremity arteriogram on 08/07/2021. Right AKA    Subjective:  Seen and examined in dialysis unit.  She has no physical complaints but a lot of concerns as mentioned above.  Objective: Vitals:   08/27/21 0740 08/27/21 0800 08/27/21 0900 08/27/21 0930  BP: (!) 105/55 (!) 84/55 128/67 (!) 162/90  Pulse:    (!) 27  Resp: 10 13 (!) 22 12  Temp:      TempSrc:      SpO2:    (!) 81%  Weight:  Height:        Intake/Output Summary (Last 24 hours) at 08/27/2021 0957 Last data filed at 08/26/2021 1800 Gross per 24 hour  Intake 110 ml  Output --  Net 110 ml    Filed Weights   08/25/21 0421 08/26/21 0636 08/27/21 0730  Weight: (!) 151 kg (!) 157.2 kg (!) 153.1 kg    Examination:  General exam: Appears calm and comfortable, morbidly obese Respiratory system: Clear to auscultation. Respiratory effort normal. Cardiovascular system: S1 & S2 heard, RRR. No JVD, murmurs, rubs, gallops or clicks. No pedal edema. Gastrointestinal system: Abdomen is nondistended, soft and nontender. No organomegaly or masses felt. Normal bowel sounds heard. Central nervous system: Alert and oriented. No focal neurological deficits. Extremities: Right AKA Skin: Ulcer at the tip of the left index finger, erythema and edema on the second left toe. Psychiatry: Judgement and insight appear normal. Mood & affect appropriate.   Data Reviewed: I have personally reviewed following labs and  imaging studies  CBC: Recent Labs  Lab 08/24/21 0834 08/25/21 1254 08/27/21 0747  WBC 18.1* 17.3* 15.0*  NEUTROABS 13.7*  --   --   HGB 7.0* 7.2* 6.1*  HCT 24.3* 24.4* 21.1*  MCV 98.4 97.6 99.1  PLT 253 259 846    Basic Metabolic Panel: Recent Labs  Lab 08/24/21 0835 08/25/21 1254 08/26/21 1158 08/27/21 0747  NA 134*  --   --   --   K 4.2  --   --   --   CL 96*  --   --   --   CO2 28  --   --   --   GLUCOSE 111*  --   --   --   BUN 27*  --   --   --   CREATININE 7.33*  --   --   --   CALCIUM 8.0*  --   --   --   MG 2.1 2.0 2.1 2.4  PHOS 5.1*  --   --   --     GFR: Estimated Creatinine Clearance: 13.7 mL/min (A) (by C-G formula based on SCr of 7.33 mg/dL (H)). Liver Function Tests: Recent Labs  Lab 08/24/21 0835  ALBUMIN 2.5*    No results for input(s): LIPASE, AMYLASE in the last 168 hours. No results for input(s): AMMONIA in the last 168 hours. Coagulation Profile: No results for input(s): INR, PROTIME in the last 168 hours. Cardiac Enzymes: No results for input(s): CKTOTAL, CKMB, CKMBINDEX, TROPONINI in the last 168 hours.  BNP (last 3 results) No results for input(s): PROBNP in the last 8760 hours. HbA1C: No results for input(s): HGBA1C in the last 72 hours. CBG: Recent Labs  Lab 08/25/21 2105 08/26/21 0644 08/26/21 1241 08/26/21 1550 08/26/21 2213  GLUCAP 129* 117* 141* 82 124*    Lipid Profile: No results for input(s): CHOL, HDL, LDLCALC, TRIG, CHOLHDL, LDLDIRECT in the last 72 hours.  Thyroid Function Tests: No results for input(s): TSH, T4TOTAL, FREET4, T3FREE, THYROIDAB in the last 72 hours. Anemia Panel: No results for input(s): VITAMINB12, FOLATE, FERRITIN, TIBC, IRON, RETICCTPCT in the last 72 hours.  Sepsis Labs: No results for input(s): PROCALCITON, LATICACIDVEN in the last 168 hours.  No results found for this or any previous visit (from the past 240 hour(s)).       Radiology Studies: No results  found.      Scheduled Meds:  sodium chloride   Intravenous Once   amLODipine  5 mg Oral Daily  aspirin EC  81 mg Oral Q breakfast   atorvastatin  10 mg Oral QHS   calcitRIOL  1 mcg Oral Q T,Th,Sa-HD   calcium carbonate  1,500 mg Oral TID with meals   calcium carbonate  1.5 tablet Oral With snacks   carvedilol  6.25 mg Oral BID WC   darbepoetin (ARANESP) injection - DIALYSIS  200 mcg Intravenous Q Tue-HD   docusate sodium  100 mg Oral BID   feeding supplement (NEPRO CARB STEADY)  237 mL Oral TID WC   gabapentin  100 mg Oral BID   heparin injection (subcutaneous)  5,000 Units Subcutaneous Q8H   heparin sodium (porcine)       insulin aspart  0-6 Units Subcutaneous TID WC   insulin glargine-yfgn  10 Units Subcutaneous Daily   lidocaine  1 patch Transdermal Q24H   lisinopril  20 mg Oral Daily   nystatin   Topical TID   pantoprazole  40 mg Oral Daily   polyethylene glycol  17 g Oral Daily   sodium chloride flush  10-40 mL Intracatheter Q12H   sodium chloride flush  3 mL Intravenous Q12H   Continuous Infusions:  sodium chloride     albumin human     ferric gluconate (FERRLECIT) IVPB 125 mg (08/24/21 1849)   magnesium sulfate bolus IVPB      Darliss Cheney, MD [facility] 08/27/2021, 9:57 AM

## 2021-08-27 NOTE — Progress Notes (Signed)
[facility] Progress Note   Subjective:   Patient seen and examined at bedside in dialysis.  Hemoglobin dropped to 6.1 this AM.  Reports BRBPR x2 over the last 2 days which she attributes to constipation and Hx bleeding hemorrhoids.  States "they open up and bleed when I have to strain a lot like I did the last 2 days."  Denies CP, SOB, abdominal pain, n/v/d, and melena. Requests to "pray over the blood" before it is transfused.  Nurse aware.   Objective Vitals:   08/27/21 0900 08/27/21 0930 08/27/21 1000 08/27/21 1030  BP: 128/67 (!) 162/90 111/77 135/66  Pulse:  (!) 27    Resp: (!) 22 12 20 15   Temp:      TempSrc:      SpO2:  (!) 81%    Weight:      Height:       Physical Exam General:Alert, obese female in NAD Heart:RRR, no mrg Lungs:mostly CTAB, breath sounds decreased, nml WOb on 2L via [place] Abdomen:soft, NTND Extremities: trace LLE edema, +R AKA Dialysis Access: TDC in use   Filed Weights   08/25/21 0421 08/26/21 0636 08/27/21 0730  Weight: (!) 151 kg (!) 157.2 kg (!) 153.1 kg    Intake/Output Summary (Last 24 hours) at 08/27/2021 1048 Last data filed at 08/26/2021 1800 Gross per 24 hour  Intake 110 ml  Output --  Net 110 ml    Additional Objective Labs: Basic Metabolic Panel: Recent Labs  Lab 08/24/21 0835  NA 134*  K 4.2  CL 96*  CO2 28  GLUCOSE 111*  BUN 27*  CREATININE 7.33*  CALCIUM 8.0*  PHOS 5.1*   Liver Function Tests: Recent Labs  Lab 08/24/21 0835  ALBUMIN 2.5*   CBC: Recent Labs  Lab 08/24/21 0834 08/25/21 1254 08/27/21 0747  WBC 18.1* 17.3* 15.0*  NEUTROABS 13.7*  --   --   HGB 7.0* 7.2* 6.1*  HCT 24.3* 24.4* 21.1*  MCV 98.4 97.6 99.1  PLT 253 259 220   Blood Culture    Component Value Date/Time   SDES BLOOD LEFT HAND 09/03/2020 2359   SPECREQUEST  09/03/2020 2359    BOTTLES DRAWN AEROBIC AND ANAEROBIC Blood Culture adequate volume   CULT  09/03/2020 2359    NO GROWTH 5 DAYS Performed at [facility], [address] [place], [place] [address]    REPTSTATUS 09/09/2020 FINAL 09/03/2020 2359   CBG: Recent Labs  Lab 08/25/21 2105 08/26/21 0644 08/26/21 1241 08/26/21 1550 08/26/21 2213  GLUCAP 129* 117* 141* 82 124*    Medications:  sodium chloride     albumin human     ferric gluconate (FERRLECIT) IVPB 125 mg (08/24/21 1849)   magnesium sulfate bolus IVPB      sodium chloride   Intravenous Once   amLODipine  5 mg Oral Daily   aspirin EC  81 mg Oral Q breakfast   atorvastatin  10 mg Oral QHS   calcitRIOL  1 mcg Oral Q T,Th,Sa-HD   calcium carbonate  1,500 mg Oral TID with meals   calcium carbonate  1.5 tablet Oral With snacks   carvedilol  6.25 mg Oral BID WC   darbepoetin (ARANESP) injection - DIALYSIS  200 mcg Intravenous Q Tue-HD   docusate sodium  100 mg Oral BID   feeding supplement (NEPRO CARB STEADY)  237 mL Oral TID WC   gabapentin  100 mg Oral BID   heparin injection (subcutaneous)  5,000 Units Subcutaneous Q8H  heparin sodium (porcine)       insulin aspart  0-6 Units Subcutaneous TID WC   insulin glargine-yfgn  10 Units Subcutaneous Daily   lidocaine  1 patch Transdermal Q24H   lisinopril  20 mg Oral Daily   nystatin   Topical TID   pantoprazole  40 mg Oral Daily   polyethylene glycol  17 g Oral Daily   sodium chloride flush  10-40 mL Intracatheter Q12H   sodium chloride flush  3 mL Intravenous Q12H    Dialysis Orders: [name] TTS  4h 19min  165kg  1K/2.5Ca bath  TDC  400 BFR  - 4/29 > HBsAg neg w/ Abs < 10  - mircera 200ug q2  - calcitriol 1 ug tiw  - sensipar 30 mg tiw   Assessment/Plan: Bilateral foot pain - w/ ischemic changes R foot+ gangrenous toes x [facility]VS consulted. S/p Bilat LE angiogram 5/3 per Dr. Virl Cagey note - severe calcific disease bilaterally.  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 Darkened toes on left foot and high risk for worsened symptoms. Per VVS/primary team Pain in R middle finger non healing ulcer w/surrounding erythema/edema.  Xray with  no acute findings.  VVS recommends betadine wash every shift. Possible angiography as outpatient of L arm once patient recovered from R AKA.   ESRD - on HD TTS. HD today per regular schedule.  Volume - appears euvolemic, Will need new EDW on d/c. ~151kg.  HTN - Blood pressure well controlled overall, intermittently elevated.  Home meds per pmd as needed. Currently on amlodipine 5mg  qd, carvedilol 6.25mg  BID, and lisinopril 20mg  qd.   Anemia ckd - Hgb drop to 6.1 likely d/t bleeding hemorrhoids. 2 units pRBC ordered with HD today. Receiving aranesp q Tuesday. Iron sat 20% - given dose of IV Fe. Transfuse PRN for Hgb <7 MBD ckd - CCa and phos controlled. Sensipar has on hold d/t hypocalcemia which is improving. Cont vdra w/ HD TIW. On tums as binder.  Chronic hypoxic respiratory failure - on home oxygen  Nutrition- Renal diet w/fluid restrictions.  Alb 2.5. Ordered Nepro supplement but gives diarrhea so not drinking.  Daughter can bring protein bar. Dispo - Now planning to d/c home, says she is waiting for equipment  Jen Mow, PA-C [place] 08/27/2021,10:48 AM  LOS: 23 days

## 2021-08-27 NOTE — Progress Notes (Signed)
Pt refused cbg checks and insulin and coreg, MD aware, pt educated, she states that her home doc told her not to take coreg when sbp is less than 150

## 2021-08-27 NOTE — Progress Notes (Signed)
Patient is due for blood transfusion of 2 units bag. However, there is only 107mins left in dialysis treatment when blood bank called, therefore postpone administration of the blood here. Primary nurse Meriel Pica was informed regarding situation and endorse blood transfusion in the floor.

## 2021-08-28 ENCOUNTER — Other Ambulatory Visit (HOSPITAL_COMMUNITY): Payer: Self-pay

## 2021-08-28 LAB — TYPE AND SCREEN
ABO/RH(D): O POS
Antibody Screen: NEGATIVE
Unit division: 0
Unit division: 0

## 2021-08-28 LAB — CBC
HCT: 28 % — ABNORMAL LOW (ref 36.0–46.0)
Hemoglobin: 8.4 g/dL — ABNORMAL LOW (ref 12.0–15.0)
MCH: 28.1 pg (ref 26.0–34.0)
MCHC: 30 g/dL (ref 30.0–36.0)
MCV: 93.6 fL (ref 80.0–100.0)
Platelets: 226 10*3/uL (ref 150–400)
RBC: 2.99 MIL/uL — ABNORMAL LOW (ref 3.87–5.11)
RDW: 19.9 % — ABNORMAL HIGH (ref 11.5–15.5)
WBC: 15.1 10*3/uL — ABNORMAL HIGH (ref 4.0–10.5)
nRBC: 0 % (ref 0.0–0.2)

## 2021-08-28 LAB — BPAM RBC
Blood Product Expiration Date: 202306192359
Blood Product Expiration Date: 202306192359
ISSUE DATE / TIME: 202305231606
ISSUE DATE / TIME: 202305232114
Unit Type and Rh: 5100
Unit Type and Rh: 5100

## 2021-08-28 LAB — MAGNESIUM: Magnesium: 2.2 mg/dL (ref 1.7–2.4)

## 2021-08-28 LAB — GLUCOSE, CAPILLARY: Glucose-Capillary: 160 mg/dL — ABNORMAL HIGH (ref 70–99)

## 2021-08-28 MED ORDER — GLIPIZIDE ER 2.5 MG PO TB24
2.5000 mg | ORAL_TABLET | Freq: Every day | ORAL | 0 refills | Status: DC
  Filled 2021-08-28: qty 30, 30d supply, fill #0

## 2021-08-28 MED ORDER — HYDROMORPHONE HCL 1 MG/ML IJ SOLN: 0.5000 mg | Freq: Two times a day (BID) | INTRAMUSCULAR | Status: DC | PRN

## 2021-08-28 MED ORDER — LISINOPRIL 20 MG PO TABS
20.0000 mg | ORAL_TABLET | Freq: Every day | ORAL | 0 refills | Status: DC
  Filled 2021-08-28: qty 30, 30d supply, fill #0

## 2021-08-28 MED ORDER — CARVEDILOL 6.25 MG PO TABS
6.2500 mg | ORAL_TABLET | Freq: Two times a day (BID) | ORAL | 0 refills | Status: DC
Start: 2021-08-28 — End: 2021-10-21
  Filled 2021-08-28: qty 60, 30d supply, fill #0

## 2021-08-28 MED ORDER — TRAMADOL HCL 50 MG PO TABS
50.0000 mg | ORAL_TABLET | Freq: Four times a day (QID) | ORAL | 0 refills | Status: DC | PRN
  Filled 2021-08-28: qty 20, 5d supply, fill #0

## 2021-08-28 MED ORDER — GABAPENTIN 100 MG PO CAPS
100.0000 mg | ORAL_CAPSULE | Freq: Two times a day (BID) | ORAL | 0 refills | Status: DC
  Filled 2021-08-28: qty 60, 30d supply, fill #0

## 2021-08-28 NOTE — Discharge Summary (Signed)
PatientPhysician Discharge Summary  Courtney Gray FIE:[id number] DOB: March 13, 1971 DOA: 08/03/2021  PCP: Medicine, [facility]  Admit date: 08/03/2021 Discharge date: 08/28/2021 30 Day Unplanned Readmission Risk Score    Flowsheet Row ED to Hosp-Admission (Current) from 08/03/2021 in [facility] 4E CV SURGICAL PROGRESSIVE CARE  30 Day Unplanned Readmission Risk Score (%) 45.89 Filed at 08/28/2021 0801       This score is the patient's risk of an unplanned readmission within 30 days of being discharged (0 -100%). The score is based on dignosis, age, lab data, medications, orders, and past utilization.   Low:  0-14.9   Medium: 15-21.9   High: 22-29.9   Extreme: 30 and above          Admitted From: Home Disposition: Home  Recommendations for Outpatient Follow-up:  Follow up with PCP in 1-2 weeks Please obtain BMP/CBC in one week Follow-up for hemodialysis on your schedule. Follow-up with vascular surgery in 4 weeks, office will call you to arrange appointment. Please follow up with your PCP on the following pending results: Unresulted Labs (From admission, onward)     Start     Ordered   08/24/21 0500  Magnesium  Daily,   R     Question:  Specimen collection method  Answer:  Lab=Lab collect   08/23/21 0849   Signed and Held  Renal function panel  Once,   R       Question:  Specimen collection method  Answer:  Lab=Lab collect   Signed and Held   Signed and Held  CBC  Once,   R       Question:  Specimen collection method  Answer:  Lab=Lab collect   Signed and Held   Signed and Held  Renal function panel  Once,   R       Question:  Specimen collection method  Answer:  Lab=Lab collect   Signed and Held   Signed and Held  CBC  Once,   R       Question:  Specimen collection method  Answer:  Lab=Lab collect   Signed and [place]: Yes Equipment/Devices: Multiple Counselling psychologist lift  Discharge Condition: Stable CODE STATUS: Full code Diet  recommendation: Low-sodium/cardiac  Subjective: Patient seen and examined this morning.  No new complaint.  Lengthy discussion with the patient in presence of Dr. Thereasa Solo, Olga Coaster, Shelby Mattocks and charge nurse.  Details below but after that discussion, patient was fully agreeable with the discharge plan.  Brief/Interim Summary: 51 y.o. female with medical history significant of end-stage renal disease on hemodialysis, gout, diabetes mellitus type II, hypertension who just discharged from rehab this past Monday and was told that she had run out of her days and that she needed to pay out-of-pocket for any further rehab.  She presented again with worsening right foot pain and there was a concern for ischemic foot.  She was admitted and transferred to [facility].  CT angiogram was inconclusive.  She underwent arteriogram on 08/07/2021.  Seen by podiatry as well as vascular surgery.  Eventually underwent right AKA.  She is hemodialysis patient.     Ulcer left middle finger:  -Apparently, patient had developed this ulcer on the left middle finger even before admission this time but she never mentioned this to anyone until late during this hospitalization.   -She has been applying cream on this and according to her, it  has been healing.  -x ray x 2 does not show osteomyelitis -per vascular:  discontinue ointment; start betadine paint every shift; plan was to allow patient to recover from R AKA prior to considering L arm angiography as an outpatient.  CT of the left hand was ordered since 08/22/2021 but is not completed yet and according to vascular note, patient has refused CT scan multiple times, they will arrange outpatient follow-up for this.   Left second toe pain: Per vascular note, patient did have black discoloration since admission but that is worse. On examination, she does appear to have brown/black left second toe which is tender to palpation but is cold instead of warm.  Patient is  convinced that this is gout.  She was informed and educated that this is more likely a vascular issue as vascular surgery has documented and does not fit criteria for gout diagnosis.  Following copied from vascular surgery's note from 3 days ago  "Had a very long discussion with patient regarding her LLE perfusion at that she has single vessel peroneal runoff She likely is at high risk for worsening symptoms of left foot. Discussed with patient that this is not gout but a vascular issue. At this time she is without any rest pain or tissue loss Discussed continuing to monitor her left foot for rest pain or tissue loss. She does not have any revascularization options either open or endovascular".  Patient will follow-up with vascular surgery outpatient about this issue as well.   End-stage renal disease on hemodialysis -Nephrology following.  Dialysis as per nephrology schedule.  Follow-up outpatient dialysis schedule.   Chronic respiratory failure with hypoxia -Chronically on 2-3 L oxygen via nasal cannula.  Respiratory status currently stable   Diabetes mellitus type 2 with hypoglycemia -A1c 5.7. -Glipizide and Lantus were discontinued during last hospitalization because of episodes of hypoglycemia.  On Semglee 10 units and SSI.  Blood sugar controlled.  Based on the fact that she was requiring long-acting insulin here, I have prescribed her glipizide ER 2.5 mg daily.   Essential hypertension:  -Continue amlodipine and Coreg.  Acute on chronic anemia of chronic kidney disease: Hemoglobin is drifting down.  It was 6.3 yesterday.  Patient received 2 units of PRBC transfusion and hemoglobin is over 8 today.   Hyperlipidemia -continue statin   Hypersomnolence -Likely due to combination of illness and medications.  Gabapentin dose has been decreased.  Judicious use of narcotics.  Feeling much better and fully awake and alert Since 08/10/2021.   History of hemorrhoids -Stable  Questionable  history of gout -apparently treated for gout of her right foot recently   Disposition: SNF was recommended by PT OT.  She was declined by the only SNF that had initially offered a bed and she was not for bed at any other facility.  Plan was discussed with the patient to discharge home with all the DME that she requires.  All DME's as well as Harrel Lemon lift was delivered 2 to 3 days ago..  Patient initially had concerns that she has not received any training and neither her daughter did and that is why she was not comfortable going home.  Per PT, patient did receive some training virtually and her daughter also had received some training via FaceTime and Harrel Lemon lift has been arranged.  However patient had concerns that the [facility] lift is mechanical and requires more than 1 person and she does not have anyone else other than her daughter and patient does  not think that it would work until she has another and or the [place] lift can be replaced electrical.  Concerns conveyed to [facility] team who had a lengthy discussion with the patient.   Due to patient's continued refusal to be discharged, this patient was reviewed by UM committee and TOC leadership.  Patient has been medically optimized for the last few days for discharge, she has also received all the DME that was needed.  Per TOC, that is going to be out-of-pocket for her which patient is not willing to pay.  On 08/27/2021 I was told by Dr. Vernell Leep to place the discharge order.  After my discussion with him, we decided to space out/wean her IV pain medications and transition her to oral pain medication and have a discussion with the patient and set up expectations for discharge on 08/28/2021.  I went to see the patient at around 2:45 PM, I also took the patient's nurse Sharyn Lull with me as a witness to the conversation.  We sat down in patient's room, I started talking to the patient explaining to everything that is documented above, patient became upset as expected,  she resisted going home saying that she has not been trained on anything and that if she were to go home, she will be in the bed and will not be able to go to dialysis and eventually will come back to the hospital.  She also accused to me that I was being dishonest but she did not elaborate the reason for saying that. She asked me to send the people are leadership or physician who is making the decision to discharge her.  She was informed that I am the attending physician and the only physician that is responsible to talk to her.  She then told me that she has already requested to replace me (which I confirmed with the nurse in front of the patient, she had not requested as such).  Then she demanded for physician to be replaced.  At that time we ended the conversation and came out of the room.  I escalated the case and discussed with my leadership.  Today on 08/28/2021 me along with campus director Dr. Thereasa Solo, [facility] director Olga Coaster, Janett Billow social worker and charge nurse had a lengthy discussion with the patient.  Patient requested an Museum/gallery conservator lift but there is a co-pay required that patient is not agreeable to pay.  She was explained the use of manual Hoyer lift and that the DME company will send a representative to the home today to show the family members how to use it properly.  Patient also stated that she had Medicaid secondary and Janett Billow, Education officer, museum will assist with paperwork to provide additional hours in the home for aid that she requested.  All her questions and concerns were addressed appropriately and at the end of conversation, she fully agree with the discharge plan.  Morbid obesity:  Estimated body mass index is 56.17 kg/m as calculated from the following:   Height as of this encounter: 5\' 5"  (1.651 m).   Weight as of this encounter: 153.1 kg.  Weight loss counseling provided.  Discharge plan was discussed with patient and/or family member and they verbalized understanding and  agreed with it.  Discharge Diagnoses:  Principal Problem:   Ischemic foot pain at rest Active Problems:   ESRD (end stage renal disease) ([place])   Chronic respiratory failure with hypoxia ([facility])   Pain   Gangrene of left foot ([place])  Discharge Instructions   Allergies as of 08/28/2021       Reactions   Benzonatate Other (See Comments)   Hallucinations   Hydralazine Other (See Comments)   Hallucinations   Amoxicillin Nausea And Vomiting   Clonidine Other (See Comments)   Altered mental state   Chlorhexidine Itching   Morphine Itching   Oxycodone Itching   Pt tolerated hospital admission 07/2021        Medication List     STOP taking these medications    CHLORASEPTIC MT   oxyCODONE 5 MG immediate release tablet Commonly known as: Oxy IR/ROXICODONE       TAKE these medications    acetaminophen 500 MG tablet Commonly known as: TYLENOL Take 1,000 mg by mouth every 6 (six) hours as needed for moderate pain or headache.   amLODipine 10 MG tablet Commonly known as: NORVASC Take 1 tablet (10 mg total) by mouth daily. What changed:  when to take this reasons to take this   aspirin EC 81 MG tablet Take 1 tablet (81 mg total) by mouth daily with breakfast.   atorvastatin 10 MG tablet Commonly known as: LIPITOR Take 1 tablet (10 mg total) by mouth daily. What changed: when to take this   carvedilol 6.25 MG tablet Commonly known as: COREG Take 1 tablet (6.25 mg total) by mouth 2 (two) times daily with a meal. What changed:  medication strength how much to take when to take this   diphenhydrAMINE 25 MG tablet Commonly known as: BENADRYL Take 25 mg by mouth daily as needed for allergies or sleep.   docusate sodium 100 MG capsule Commonly known as: COLACE Take 100 mg by mouth 2 (two) times daily.   gabapentin 100 MG capsule Commonly known as: NEURONTIN Take 1 capsule (100 mg total) by mouth 2 (two) times daily. What changed:  how much to take when to  take this additional instructions   glipiZIDE 2.5 MG 24 hr tablet Commonly known as: GLUCOTROL XL Take 1 tablet (2.5 mg total) by mouth daily with breakfast.   HYDROcodone-acetaminophen 5-325 MG tablet Commonly known as: NORCO/VICODIN Take 1-2 tablets by mouth every 4 (four) hours as needed for moderate pain.   lisinopril 20 MG tablet Commonly known as: ZESTRIL Take 1 tablet (20 mg total) by mouth daily.   loperamide 2 MG tablet Commonly known as: IMODIUM A-D Take 4 mg by mouth as needed for diarrhea or loose stools.   nitroGLYCERIN 0.4 MG SL tablet Commonly known as: NITROSTAT Place 1 tablet (0.4 mg total) under the tongue every 5 (five) minutes as needed for chest pain.   pantoprazole 40 MG tablet Commonly known as: PROTONIX Take 1 tablet (40 mg total) by mouth daily.   simethicone 125 MG chewable tablet Commonly known as: MYLICON Chew 458 mg by mouth 2 (two) times daily as needed for flatulence.   traMADol 50 MG tablet Commonly known as: ULTRAM Take 1 tablet (50 mg total) by mouth every 6 (six) hours as needed for moderate pain.   Tums Smoothies 750 MG chewable tablet Generic drug: calcium carbonate Chew 750-1,500 mg by mouth See admin instructions. Take 1500 mg with each meal and 750 mg with each snack               Durable Medical Equipment  (From admission, onward)           Start     Ordered   08/23/21 1424  For home use only DME Other see comment  Once       Comments: Harrel Lemon lift  S/p right AKA  Question:  Length of Need  Answer:  Lifetime   08/23/21 1424   08/22/21 1643  For home use only DME Other see comment  Once       Comments: Slide board 29-30 inch S/P right AKA on 08/14/2021  Question:  Length of Need  Answer:  Lifetime   08/22/21 1643   08/22/21 1551  For home use only DME Hospital bed  Once       Question Answer Comment  Length of Need Lifetime   Patient has (list medical condition): Amputation and obesity   The above medical  condition requires: Patient requires the ability to reposition frequently   Head must be elevated greater than: 30 degrees   Bed type Semi-electric   Support Surface: Gel Overlay      08/22/21 1551            Follow-up Information     [facility] Follow up in 4 week(s).   Specialty: Vascular Surgery Why: Office will call you to arrange your appt (sent) Contact information: [address] [facility] Follow up.   Why: ([facility])- hospital bed and sliding board, hoyer lift arranged- to be delivered to home. Contact information: [place] [address] [place] Follow up.   Why: HHPT/OT/aide arranged- they will contact you to set up home visits- [phone])        [facility] Follow up in 1 week(s).   Specialty: Internal Medicine Contact information: [place] [address] [phone]                Allergies  Allergen Reactions   Benzonatate Other (See Comments)    Hallucinations   Hydralazine Other (See Comments)    Hallucinations   Amoxicillin Nausea And Vomiting   Clonidine Other (See Comments)    Altered mental state   Chlorhexidine Itching   Morphine Itching   Oxycodone Itching    Pt tolerated hospital admission 07/2021    Consultations: Vascular surgery and nephrology   Procedures/Studies: CT Angio Aortobifemoral W and/or Wo Contrast  Result Date: 08/03/2021 CLINICAL DATA:  Provided history: Claudication or leg ischemia Peripheral arterial disease (PAD), asymptomatic EXAM: CT ANGIOGRAPHY OF ABDOMINAL AORTA WITH ILIOFEMORAL RUNOFF TECHNIQUE: Multidetector CT imaging of the abdomen, pelvis and lower extremities was performed using the standard protocol during bolus administration of intravenous contrast. Multiplanar CT image reconstructions and MIPs were obtained to evaluate the  vascular anatomy. RADIATION DOSE REDUCTION: This exam was performed according to the departmental dose-optimization program which includes automated exposure control, adjustment of the mA and/or kV according to patient size and/or use of iterative reconstruction technique. CONTRAST:  154mL OMNIPAQUE IOHEXOL 350 MG/ML SOLN COMPARISON:  None. FINDINGS: VASCULAR There is poor arterial opacification, examination is marginally diagnostic best. Aorta: Aortic atherosclerosis. No aortic aneurysm. No severe stenosis. Celiac: Densely calcified throughout its course.  No acute findings. SMA: Densely calcified. Replaced right hepatic artery. Poor opacification of the SMA, nondiagnostic evaluation of branches. Renals: 2 right renal arteries. Superior renal arteries severely stenotic and densely calcified at the origin. Lower renal artery is densely calcified and severely stenotic. Mildly stenotic left renal artery. Patient with end-stage renal disease. IMA: Patent but calcified. RIGHT Lower Extremity Inflow: Nondiagnostic assessment due  to bolus timing and soft tissue attenuation from habitus. Densely calcified. Outflow: Essentially nondiagnostic assessment. Densely calcified. There is some opacification of the common, superficial femoral and profundus arteries. No obvious occlusive disease, however more detailed assessment is limited. Runoff: Densely calcified.  Nondiagnostic assessment. LEFT Lower Extremity Inflow: Nondiagnostic assessment due to bolus timing and soft tissue attenuation from habitus. Densely calcified. Outflow: Essentially nondiagnostic assessment. Densely calcified. There is some opacification of the common, superficial femoral and profundus femoral arteries. No obvious occlusive disease, however more detailed assessment is limited. Runoff: Densely calcified.  Nondiagnostic assessment. Veins: Venous system is unopacified and suboptimally assessed. Review of the MIP images confirms the above findings.  NON-VASCULAR Lower chest: Trace bilateral pleural effusions. Cardiomegaly with coronary artery calcifications. Hepatobiliary: The liver is enlarged. Suspected hepatic steatosis. No evidence of focal lesion on this arterial phase exam. Gallbladder physiologically distended, no calcified stone. No biliary dilatation. Pancreas: No ductal dilatation or inflammation. Spleen: Normal in size without focal abnormality. Adrenals/Urinary Tract: Normal adrenal glands. Marked bilateral renal parenchymal atrophy consistent end-stage renal disease. There is symmetric perinephric edema. Empty bladder. Stomach/Bowel: Nondistended stomach. No obvious bowel obstruction or inflammation. Sigmoid diverticulosis without diverticulitis. Lymphatic: There is no bulky adenopathy, although assessment is limited by soft tissue attenuation from habitus. Reproductive: Uterine vascular calcifications, otherwise unremarkable. No adnexal mass. Other: No ascites.  Subcutaneous edema of the abdominal pannus. Musculoskeletal: Subcutaneous edema of the left lower extremity more prominent distally, however the entire extremity is involved. Subcutaneous edema of the right lower extremity from the calf through the foot. There is scattered soft tissue calcifications in the subcutaneous tissue is typical of end-stage renal disease. Please note that assessment of the lower extremity osseous structures is limited due to wide field-of-view imaging. No obvious acute osseous findings. Bilateral knee osteoarthritis. IMPRESSION: VASCULAR 1. Essentially nondiagnostic CTA. The abdominal aorta and all branches are densely calcified. Contrast bolus timing is poor, and soft tissue attenuation from habitus obscures assessment of the iliac arteries and outflow. 2. Flow is demonstrated within both superficial femoral arteries, without obvious occlusive disease. The arteries are densely calcified and there is poor contrast opacification limiting further assessment.  Nondiagnostic assessment below the knees. 3. As clinically indicated, catheter angiography is recommended for further assessment. NON-VASCULAR 1. Enlarged liver with suspected steatosis. 2. Cardiomegaly. 3. Sigmoid diverticulosis without diverticulitis. Electronically Signed   By: Keith Rake M.D.   On: 08/03/2021 19:12   DG Hand 2 View Left  Result Date: 08/20/2021 CLINICAL DATA:  Lesion post needle stick EXAM: LEFT HAND - 2 VIEW COMPARISON:  08/03/2021 FINDINGS: There is no evidence of fracture or dislocation. There is no evidence of arthropathy or other focal bone abnormality. Extensive vascular calcifications. No subcutaneous gas or radiodense foreign body. IMPRESSION: No acute findings Electronically Signed   By: Lucrezia Europe M.D.   On: 08/20/2021 13:04   PERIPHERAL VASCULAR CATHETERIZATION  Result Date: 08/07/2021 Images from the original result were not included. Patient name: Courtney Gray MRN: [id number] DOB: Mar 16, 1971 Sex: female 08/07/2021 Pre-operative Diagnosis: Right lower extremity Rutherford 5 critical limb ischemia Post-operative diagnosis:  Same Surgeon:  Broadus John, MD Procedure Performed: 1.  Ultrasound-guided micropuncture access of the left common femoral artery 2.  Aortogram 3.  Second-order cannulation, right lower extremity angiogram 4.  Left lower extremity angiogram 5.  Device assisted closure-Pro-glide 6.  Contrast volume 132 7.  Sedation 40 minutes Indications:  51 year old female that vascular surgery was consulted for right foot ischemia with gangrene as pictured above.  Patient with Rutherford 5 critical limb ischemia in the right leg.  She would benefit from right lower extremity angiography in an effort to define and improve distal perfusion for wound healing. Prior to angiography today, I had a long discussion with her regarding the case, including risks and benefits. She was most worried she would not be able to lie flat.  We have placed her on a wedge in an effort to  keep her head slightly elevated.  Furthermore, she is complaining of itching.  She initially wanted to cancel her case, however elected to proceed after hearing that I would not be able to do her case later today and that she would have to be rescheduled either Thursday or Friday.  Veronica's morbid obesity places her in a high risk category for angiography, especially as the common femoral artery must be accessed.  I discussed with her that I would terminate the case should any portion be deemed unsafe, or if she became able to remain still. Overall the above conversation with Eudelia was difficult.  All questions she asked were accusatory.  I told Verdene Lennert that I was happy to take care of her and my first priority was her safety.  I appreciate hospital medicine bringing podiatry on board, as she will likely require amputations. Findings: Aortogram: Bilateral renal arteries not appreciated, aortoiliac system widely patent On the right: Severe calcific disease throughout.  No flow-limiting stenosis appreciated in the common femoral, profunda, superficial femoral, popliteal arteries.  The posterior tibial artery is occluded, peroneal artery is patent to the mid tibia, where it occludes.  There is single-vessel anterior tibial runoff to the ankle with flush occlusion.  The anterior tibial artery has diffuse disease throughout.  Flow into the foot is through collaterals. The left: Severe calcific disease throughout.  No flow-limiting stenosis appreciated in the common femoral, profunda, superficial femoral, popliteal arteries.  The anterior tibial artery is patent for 4 cm prior to occlusion.  The tibioperoneal trunk is patent with single-vessel runoff being the peroneal artery.  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  Peroneal artery provides runoff to the foot via collaterals.  Procedure:  The patient was  identified in the holding area and taken to room 8.  The patient was then placed supine on the table and prepped and draped in the usual sterile fashion.  A time out was called.  Ultrasound was used to evaluate the left common femoral artery.  It was patent .  A digital ultrasound image was acquired.  A micropuncture needle was used to access the left common femoral artery under ultrasound guidance.  An 018 wire was advanced without resistance and a micropuncture sheath was placed.  The 018 wire was removed and a benson wire was placed.  The micropuncture sheath was exchanged for a 5 french sheath.  An omniflush catheter was advanced over the wire to the level of L-1.  An abdominal angiogram was obtained.  Next, using the omniflush catheter and a benson wire, the aortic bifurcation was crossed and the catheter was placed into theright external iliac artery and right runoff was obtained.  left runoff was performed via retrograde sheath injections. Impression: Severe calcific disease in bilateral lower extremities with single-vessel runoff to the level of the ankle.  This is via the anterior tibial artery on the right, and peroneal artery on the left.  Bilateral feet fill through collaterals.  Patient is maximally revascularized on the right. She unfortunately does not have any endovascular or open surgical options to improve perfusion. Cassandria Santee, MD [facility] Office: [phone]  DG Hand Complete Left  Result Date: 08/03/2021 CLINICAL DATA:  Left hand pain EXAM: LEFT HAND - COMPLETE 3+ VIEW COMPARISON:  None. FINDINGS: There is no evidence of fracture or dislocation. There is no evidence of arthropathy or other focal bone abnormality. Generalized soft tissue swelling, most pronounced along the dorsum of the hand. Extensive atherosclerotic vascular calcifications. IMPRESSION: 1. Soft tissue swelling without acute osseous abnormality. 2. Extensive atherosclerotic vascular  calcifications. Electronically Signed   By: Davina Poke D.O.   On: 08/03/2021 10:53   DG Foot Complete Left  Result Date: 08/03/2021 CLINICAL DATA:  Foot pain EXAM: RIGHT FOOT COMPLETE - 3+ VIEW; LEFT FOOT - COMPLETE 3+ VIEW COMPARISON:  07/09/2021 FINDINGS: No evidence of acute fracture or dislocation of the bilateral feet. Mild hallux valgus deformities bilaterally. No erosion or periosteal elevation is seen. Generalized soft tissue swelling of both feet, left worse than right. Extensive atherosclerotic vascular calcifications of both feet. IMPRESSION: 1. No acute osseous abnormality of the bilateral feet. 2. Generalized soft tissue swelling, left worse than right. 3. Extensive atherosclerotic vascular calcifications bilaterally. Electronically Signed   By: Davina Poke D.O.   On: 08/03/2021 10:52   DG Foot Complete Right  Result Date: 08/03/2021 CLINICAL DATA:  Foot pain EXAM: RIGHT FOOT COMPLETE - 3+ VIEW; LEFT FOOT - COMPLETE 3+ VIEW COMPARISON:  07/09/2021 FINDINGS: No evidence of acute fracture or dislocation of the bilateral feet. Mild hallux valgus deformities bilaterally. No erosion or periosteal elevation is seen. Generalized soft tissue swelling of both feet, left worse than right. Extensive atherosclerotic vascular calcifications of both feet. IMPRESSION: 1. No acute osseous abnormality of the bilateral feet. 2. Generalized soft tissue swelling, left worse than right. 3. Extensive atherosclerotic vascular calcifications bilaterally. Electronically Signed   By: Davina Poke D.O.   On: 08/03/2021 10:52   VAS [place] UPPER EXTREMITY ARTERIAL DUPLEX  Result Date: 08/11/2021  UPPER EXTREMITY DUPLEX STUDY Patient Name:  Courtney Gray  Date of Exam:   08/11/2021 Medical Rec #: [id number]        Accession #:    [id number] Date of Birth: 1970-08-03         Patient Gender: F Patient Age:   60 years Exam Location:  [facility] Procedure:      VAS [place] UPPER EXTREMITY ARTERIAL DUPLEX Referring  Phys: Dagoberto Ligas --------------------------------------------------------------------------------  Indications: Painful wound on left third digit. Unable to palpate left radial              pulse per note  Other Factors: PAD (right foot and toe gangrene) Limitations: Body habitus (BMI 60.79), patient unable to rest back on bed              secondary to foot pain and SOB. Unable to keep arm rotated or              still. Comparison Study: No prior study Performing Technologist: Sharion Dove RVS  Examination Guidelines: A complete evaluation includes B-mode imaging, spectral Doppler, color Doppler, and power Doppler as needed of all accessible portions of each vessel. Bilateral testing is considered an integral part of a complete examination. Limited examinations for reoccurring indications may be performed as noted.  Left Doppler Findings: +--------------+----------+-----------+--------+-------------------------------+ Site          PSV (cm/s)Waveform  StenosisComments                        +--------------+----------+-----------+--------+-------------------------------+ Subclavian Mid84        multiphasic                                        +--------------+----------+-----------+--------+-------------------------------+ Axillary      65        multiphasic                                        +--------------+----------+-----------+--------+-------------------------------+ Brachial Prox 83        multiphasic                                        +--------------+----------+-----------+--------+-------------------------------+ Brachial Mid  114       multiphasic                                        +--------------+----------+-----------+--------+-------------------------------+ Brachial Dist 101       multiphasic                                        +--------------+----------+-----------+--------+-------------------------------+ Radial Prox   128        multiphasic                                        +--------------+----------+-----------+--------+-------------------------------+ Radial Mid    99        multiphasic                                        +--------------+----------+-----------+--------+-------------------------------+ Radial Dist   207                          Possible 50-74% stenosis noted                                             in the distal radial            +--------------+----------+-----------+--------+-------------------------------+ Ulnar Prox    71        multiphasic                                        +--------------+----------+-----------+--------+-------------------------------+ Ulnar Mid     42        multiphasic                                        +--------------+----------+-----------+--------+-------------------------------+ Ulnar Dist  not visualized                  +--------------+----------+-----------+--------+-------------------------------+ Palmar Arch   87                           unable to visualize full palmar                                            arch or Doppler flow to finger                                             as patient could not keep hand                                             open or still                   +--------------+----------+-----------+--------+-------------------------------+   Summary:  Left: Obstruction noted in the Possible 50-74% stenosis noted in the       distal radial artery. unable to visualize full palmar arch or       Doppler flow to finger as patient could not keep hand open or       still. *See table(s) above for measurements and observations. Electronically signed by Harold Barban MD on 08/11/2021 at 10:01:35 PM.    Final      Discharge Exam: Vitals:   08/28/21 0630 08/28/21 0835  BP: (!) 136/50 (!) 142/58  Pulse: 78 78  Resp: 15 14  Temp: 98.2 F (36.8 C) 97.7  F (36.5 C)  SpO2: 98% 100%   Vitals:   08/28/21 0018 08/28/21 0628 08/28/21 0630 08/28/21 0835  BP: (!) 144/73  (!) 136/50 (!) 142/58  Pulse: 88  78 78  Resp: 19  15 14   Temp: 98.6 F (37 C)  98.2 F (36.8 C) 97.7 F (36.5 C)  TempSrc: Axillary  Axillary Axillary  SpO2: 100%  98% 100%  Weight:  (!) 150.1 kg    Height:        General: Pt is alert, awake, not in acute distress, morbidly obese Cardiovascular: RRR, S1/S2 +, no rubs, no gallops Respiratory: CTA bilaterally, no wheezing, no rhonchi Abdominal: Soft, NT, ND, bowel sounds + Extremities: Right AKA, slightly erythematous and edematous left second toe.  Healing ulcer on the tip of the left index finger with no erythema.    The results of significant diagnostics from this hospitalization (including imaging, microbiology, ancillary and laboratory) are listed below for reference.     Microbiology: No results found for this or any previous visit (from the past 240 hour(s)).   Labs: BNP (last 3 results) Recent Labs    07/09/21 1105  BNP 9,924.2*   Basic Metabolic Panel: Recent Labs  Lab 08/24/21 0835 08/25/21 1254 08/26/21 1158 08/27/21 0747 08/28/21 0550  NA 134*  --   --   --   --   K 4.2  --   --   --   --   CL 96*  --   --   --   --  CO2 28  --   --   --   --   GLUCOSE 111*  --   --   --   --   BUN 27*  --   --   --   --   CREATININE 7.33*  --   --   --   --   CALCIUM 8.0*  --   --   --   --   MG 2.1 2.0 2.1 2.4 2.2  PHOS 5.1*  --   --   --   --    Liver Function Tests: Recent Labs  Lab 08/24/21 0835  ALBUMIN 2.5*   No results for input(s): LIPASE, AMYLASE in the last 168 hours. No results for input(s): AMMONIA in the last 168 hours. CBC: Recent Labs  Lab 08/24/21 0834 08/25/21 1254 08/27/21 0747 08/28/21 0550  WBC 18.1* 17.3* 15.0* 15.1*  NEUTROABS 13.7*  --   --   --   HGB 7.0* 7.2* 6.1* 8.4*  HCT 24.3* 24.4* 21.1* 28.0*  MCV 98.4 97.6 99.1 93.6  PLT 253 259 220 226   Cardiac  Enzymes: No results for input(s): CKTOTAL, CKMB, CKMBINDEX, TROPONINI in the last 168 hours. BNP: Invalid input(s): POCBNP CBG: Recent Labs  Lab 08/26/21 0644 08/26/21 1241 08/26/21 1550 08/26/21 2213 08/28/21 0634  GLUCAP 117* 141* 82 124* 160*   D-Dimer No results for input(s): DDIMER in the last 72 hours. Hgb A1c No results for input(s): HGBA1C in the last 72 hours. Lipid Profile No results for input(s): CHOL, HDL, LDLCALC, TRIG, CHOLHDL, LDLDIRECT in the last 72 hours. Thyroid function studies No results for input(s): TSH, T4TOTAL, T3FREE, THYROIDAB in the last 72 hours.  Invalid input(s): FREET3 Anemia work up No results for input(s): VITAMINB12, FOLATE, FERRITIN, TIBC, IRON, RETICCTPCT in the last 72 hours. Urinalysis No results found for: COLORURINE, APPEARANCEUR, LABSPEC, [place], GLUCOSEU, HGBUR, BILIRUBINUR, KETONESUR, PROTEINUR, UROBILINOGEN, NITRITE, LEUKOCYTESUR Sepsis Labs Invalid input(s): PROCALCITONIN,  WBC,  LACTICIDVEN Microbiology No results found for this or any previous visit (from the past 240 hour(s)).   Time coordinating discharge: Over 30 minutes  SIGNED:   Darliss Cheney, MD  [facility] 08/28/2021, 10:03 AM *Please note that this is a verbal dictation therefore any spelling or grammatical errors are due to the "[place]" system interpretation. If 7PM-7AM, please contact night-coverage [email or web address]

## 2021-08-28 NOTE — Progress Notes (Addendum)
Talked to patient at beside along with Dr Doristine Bosworth, Dr Thereasa Solo, Shelby Mattocks and bedside Nurse concerning DCP.   Patient requested an electric hoyer lift but there is a copay required that patient is not agreeable to pay. Explained the use of a manual hoyer lift and [employer] will send a representative to the home to show family members how to use it properly.  Patient stated that she has Medicaid secondary. Janett Billow LCSW will assist with paperwork to provide additional hours in the home.  All questions were answered, patient is in agreement for discharge home today via ambulance. Debbie RNCM updated. Mercie Eon Transition of Care Supervisor [phone]  10:29 am-  talked with Zack with [employer], he will send a rep to the patient's home today for additional training of the use of hoyer lift today. Mindi Slicker RN,MHA,CCM

## 2021-08-28 NOTE — Progress Notes (Addendum)
[facility] Progress Note   Subjective:   Patient seen and examined at bedside.  Sleepy but easily awaken to verbal stimuli.  Denies CP, SOB, abdominal pain, and n/v/d.    Objective Vitals:   08/28/21 0018 08/28/21 0628 08/28/21 0630 08/28/21 0835  BP: (!) 144/73  (!) 136/50 (!) 142/58  Pulse: 88  78 78  Resp: 19  15 14   Temp: 98.6 F (37 C)  98.2 F (36.8 C) 97.7 F (36.5 C)  TempSrc: Axillary  Axillary Axillary  SpO2: 100%  98% 100%  Weight:  (!) 150.1 kg    Height:       Physical Exam General:chronically ill appearing, obese female in NAD Heart:RRR, no mrg Lungs:CTAB, breath sounds decreased, nml WOB on 2L O2 via [place] Abdomen:soft, NTND Extremities:trace LE edema, R AKA Dialysis Access: [facility]   Filed Weights   08/27/21 0730 08/27/21 1210 08/28/21 0628  Weight: (!) 153.1 kg (!) 150.7 kg (!) 150.1 kg    Intake/Output Summary (Last 24 hours) at 08/28/2021 0937 Last data filed at 08/28/2021 0010 Gross per 24 hour  Intake 1115.5 ml  Output 2451 ml  Net -1335.5 ml    Additional Objective Labs: Basic Metabolic Panel: Recent Labs  Lab 08/24/21 0835  NA 134*  K 4.2  CL 96*  CO2 28  GLUCOSE 111*  BUN 27*  CREATININE 7.33*  CALCIUM 8.0*  PHOS 5.1*   Liver Function Tests: Recent Labs  Lab 08/24/21 0835  ALBUMIN 2.5*   CBC: Recent Labs  Lab 08/24/21 0834 08/25/21 1254 08/27/21 0747 08/28/21 0550  WBC 18.1* 17.3* 15.0* 15.1*  NEUTROABS 13.7*  --   --   --   HGB 7.0* 7.2* 6.1* 8.4*  HCT 24.3* 24.4* 21.1* 28.0*  MCV 98.4 97.6 99.1 93.6  PLT 253 259 220 226   CBG: Recent Labs  Lab 08/26/21 0644 08/26/21 1241 08/26/21 1550 08/26/21 2213 08/28/21 0634  GLUCAP 117* 141* 82 124* 160*    Medications:  sodium chloride     ferric gluconate (FERRLECIT) IVPB 125 mg (08/27/21 1056)   magnesium sulfate bolus IVPB      amLODipine  5 mg Oral Daily   aspirin EC  81 mg Oral Q breakfast   atorvastatin  10 mg Oral QHS   calcitRIOL  1 mcg  Oral Q T,Th,Sa-HD   calcium carbonate  1,500 mg Oral TID with meals   calcium carbonate  1.5 tablet Oral With snacks   carvedilol  6.25 mg Oral BID WC   darbepoetin (ARANESP) injection - DIALYSIS  200 mcg Intravenous Q Tue-HD   docusate sodium  100 mg Oral BID   feeding supplement (NEPRO CARB STEADY)  237 mL Oral TID WC   fluticasone  2 spray Each Nare Daily   gabapentin  100 mg Oral BID   heparin injection (subcutaneous)  5,000 Units Subcutaneous Q8H   insulin aspart  0-6 Units Subcutaneous TID WC   insulin glargine-yfgn  10 Units Subcutaneous Daily   lidocaine  1 patch Transdermal Q24H   lisinopril  20 mg Oral Daily   nystatin   Topical TID   pantoprazole  40 mg Oral Daily   polyethylene glycol  17 g Oral Daily   sodium chloride flush  10-40 mL Intracatheter Q12H   sodium chloride flush  3 mL Intravenous Q12H    Dialysis Orders: [name] TTS  4h 78min  165kg  1K/2.5Ca bath  TDC  400 BFR  - 4/29 > HBsAg neg w/  Abs < 10  - mircera 200ug q2  - calcitriol 1 ug tiw  - sensipar 30 mg tiw   Assessment/Plan: Bilateral foot pain - w/ ischemic changes R foot+ gangrenous toes x VVS consulted. S/p Bilat LE angiogram 5/3 per Dr. Virl Cagey note - severe calcific disease bilaterally.  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 Darkened toes on left foot and high risk for worsened symptoms. Per VVS/primary team Pain in R middle finger non healing ulcer w/surrounding erythema/edema.  Xray with no acute findings.  VVS recommends betadine wash every shift. Possible angiography as outpatient of L arm once patient recovered from R AKA.   ESRD - on HD TTS. HD tomorrow per regular schedule.  Volume - appears euvolemic, Will need new EDW on d/c. ~151kg. Continue UF to dry weight. HTN - Blood pressure well controlled overall, intermittently elevated.  Home meds per pmd as needed. Currently on amlodipine 5mg  qd, carvedilol 6.25mg  BID, and lisinopril 20mg  qd.   Anemia ckd - Hgb drop to 6.1 likely d/t bleeding hemorrhoids.  Hgb^8.4 s/p 2 units pRBC. Receiving aranesp q Tuesday. Iron sat 20% - given dose of IV Fe. Transfuse PRN for Hgb <7 MBD ckd - CCa and phos controlled. Sensipar has on hold d/t hypocalcemia which is improving. Cont vdra w/ HD TIW. On tums as binder.  Chronic hypoxic respiratory failure - on home oxygen  Nutrition- Renal diet w/fluid restrictions.  Alb 2.5. Ordered Nepro supplement but gives diarrhea so not drinking.  Daughter can bring protein bar. Dispo - Now planning to d/c home, refusing to d/c yesterday, see PMD notes.   Jen Mow, PA-C [facility] 08/28/2021,9:37 AM  LOS: 24 days

## 2021-08-28 NOTE — Progress Notes (Signed)
Mobility Specialist: Progress Note   08/28/21 1206  Mobility  Activity Transferred from bed to chair  Level of Assistance +2 (takes two people)  RLE Weight Bearing NWB  Activity Response Tolerated well  $Mobility charge 1 Mobility   Pt assisted to stretcher for discharge. Assisted pt with bed positioning and then +2 physical assist for transfer using blanket to assist with slide.   [facility] 2 Jax Abdelrahman Mobility Specialist Mobility Specialist 5 North: [phone] Mobility Specialist 6 North: [phone]

## 2021-08-28 NOTE — Care Management Important Message (Signed)
Important Message  Patient Details  Name: Courtney Gray MRN: [id number] Date of Birth: [birth year]   Medicare Important Message Given:  Yes     Shelda Altes 08/28/2021, 9:30 AM

## 2021-08-28 NOTE — TOC Transition Note (Signed)
Transition of Care (TOC) - CM/SW Discharge Note Marvetta Gibbons RN, BSN Transitions of Care Unit 4E- RN Case Manager See Treatment Team for direct phone #    Patient Details  Name: Courtney Gray MRN: [id number] Date of Birth: 1971/01/18  Transition of Care [facility]) CM/SW Contact:  Dawayne Patricia, RN Phone Number: 08/28/2021, 11:51 AM   Clinical Narrative:    Pt stable for transition home today, MD along with Medical staff Leadership, [facility] leadership and CSW met with pt this AM to discuss transition plan and pt agreeable to transition plan. This CM has spoken with Thedore Mins at [place] regarding additional training on home hoyer lift, which [employer] will schedule for this afternoon in the home with pt and daughter. All DME has been delivered to the home including lift, hospital bed and slide board (for future use). Pt reports she has ramp now as well as wheelchair and other needed DME.  Pt takes [facility] transport to HD, which her HD will schedule today for her outpt HD appointment tomorrow.   Pt has also been provided an additional [place] application for her to f/u with her primary care provider to see about getting aide assistance.   [facility] called for transport- per [facility] they will be here between 1130-1230 to transport, paperwork placed on shadow chart and bedside RN updated.   [facility] notified of discharge for start of care needs- they will contact pt to schedule visits   Final next level of care: Home w Home Health Services Barriers to Discharge: Barriers Resolved   Patient Goals and CMS Choice Patient states their goals for this hospitalization and ongoing recovery are:: to get stronger, be independent CMS Medicare.gov Compare Post Acute Care list provided to:: Patient Choice offered to / list presented to : Patient  Discharge Placement                 Home w/ [facility]      Discharge Plan and Services In-house Referral: Clinical Social Work Discharge Planning Services: CM Consult Post  Acute Care Choice: Museum/gallery conservator, Home Health          DME Arranged: Other see comment, Hospital bed (Slide board) DME Agency: [employer] Date DME Agency Contacted: 08/22/21 Time DME Agency Contacted: [phone] Representative spoke with at DME Agency: Jodell Cipro HH Arranged: PT, OT, Nurse's Aide [place] Agency: [place] Date [place]: 08/22/21 Time [place]: 74 Representative spoke with at [place]: [place] ([place]) Interventions     Readmission Risk Interventions    08/28/2021   11:50 AM 07/12/2021   12:38 PM 05/23/2021    1:12 PM  Readmission Risk Prevention Plan  Transportation Screening Complete Complete Complete  Medication Review Press photographer) Complete Complete Complete  PCP or Specialist appointment within 3-5 days of discharge Complete Complete Complete  HRI or Home Care Consult Complete Complete Complete  SW Recovery Care/Counseling Consult Complete Complete Complete  Palliative Care Screening Not Applicable Not Applicable Not [place] Not Applicable Complete Not Applicable

## 2021-08-28 NOTE — Progress Notes (Signed)
08/28/2021 11:54 AM [facility] is her to take pt home. [place]

## 2021-08-28 NOTE — Progress Notes (Signed)
08/28/2021 11:41 AM  Pt ready for Discharge.  Awaiting [facility] to take pt home. [place]

## 2021-08-28 NOTE — Progress Notes (Addendum)
Pt to d/c to home today. Contacted [employer] and spoke to [place]. Clinic advised pt will d/c today and will resume care at clinic tomorrow. Clinic inquired if pt has a ramp at her home in order for pt to get out of home to go to out-pt HD. They also requested that pt be informed that she needs to arrive at HD with hoyer pad/sling under pt in wheelchair so staff can lift pt from w/c to HD chair. Contacted TOC staff via secure chat. RN CM states that pt informed her that there is a ramp at pt's home. Navigator called and spoke to pt via phone. Pt advised that clinic is aware of pt's d/c and that she will resume tomorrow. Pt also advised that she needs to arrive at clinic tomorrow with hoyer pad/sling under her in wheelchair. Pt voices understanding. Also placed this information on pt's AVS as well. Returned call to clinic and spoke to [place] again. Joy advised that pt is reporting a ramp at the home and that pt was advised to arrive with hoyer pad/sling under pt. Joy to contact transportation today to make them aware that pt will need transportation tomorrow to out-pt HD appt. Will fax d/c summary to clinic for continuation of care once available.   Melven Sartorius Renal Navigator [phone]  Addendum at 12:13 pm: D/C summary faxed to [facility] for continuation of care (fax# [phone])

## 2021-09-03 ENCOUNTER — Encounter (HOSPITAL_COMMUNITY): Payer: Self-pay | Admitting: Emergency Medicine

## 2021-09-03 ENCOUNTER — Telehealth: Payer: Self-pay

## 2021-09-03 ENCOUNTER — Emergency Department (HOSPITAL_COMMUNITY)
Admission: EM | Admit: 2021-09-03 | Discharge: 2021-09-04 | Disposition: A | Discharge status: Home / Self Care | Payer: 59 | Attending: Emergency Medicine | Admitting: Emergency Medicine

## 2021-09-03 ENCOUNTER — Emergency Department (HOSPITAL_COMMUNITY): Payer: 59

## 2021-09-03 ENCOUNTER — Other Ambulatory Visit: Payer: Self-pay

## 2021-09-03 DIAGNOSIS — R630 Anorexia: Secondary | ICD-10-CM | POA: Insufficient documentation

## 2021-09-03 DIAGNOSIS — Z20822 Contact with and (suspected) exposure to covid-19: Secondary | ICD-10-CM | POA: Insufficient documentation

## 2021-09-03 DIAGNOSIS — Z7982 Long term (current) use of aspirin: Secondary | ICD-10-CM | POA: Insufficient documentation

## 2021-09-03 DIAGNOSIS — R531 Weakness: Secondary | ICD-10-CM | POA: Diagnosis present

## 2021-09-03 DIAGNOSIS — R109 Unspecified abdominal pain: Secondary | ICD-10-CM | POA: Insufficient documentation

## 2021-09-03 DIAGNOSIS — R110 Nausea: Secondary | ICD-10-CM | POA: Diagnosis not present

## 2021-09-03 DIAGNOSIS — R5383 Other fatigue: Secondary | ICD-10-CM | POA: Diagnosis not present

## 2021-09-03 LAB — CBC WITH DIFFERENTIAL/PLATELET
Abs Immature Granulocytes: 0.1 10*3/uL — ABNORMAL HIGH (ref 0.00–0.07)
Basophils Absolute: 0.1 10*3/uL (ref 0.0–0.1)
Basophils Relative: 1 %
Eosinophils Absolute: 0.5 10*3/uL (ref 0.0–0.5)
Eosinophils Relative: 4 %
HCT: 29.5 % — ABNORMAL LOW (ref 36.0–46.0)
Hemoglobin: 8.6 g/dL — ABNORMAL LOW (ref 12.0–15.0)
Immature Granulocytes: 1 %
Lymphocytes Relative: 8 %
Lymphs Abs: 1.1 10*3/uL (ref 0.7–4.0)
MCH: 28.2 pg (ref 26.0–34.0)
MCHC: 29.2 g/dL — ABNORMAL LOW (ref 30.0–36.0)
MCV: 96.7 fL (ref 80.0–100.0)
Monocytes Absolute: 0.9 10*3/uL (ref 0.1–1.0)
Monocytes Relative: 7 %
Neutro Abs: 10.6 10*3/uL — ABNORMAL HIGH (ref 1.7–7.7)
Neutrophils Relative %: 79 %
Platelets: 313 10*3/uL (ref 150–400)
RBC: 3.05 MIL/uL — ABNORMAL LOW (ref 3.87–5.11)
RDW: 18.9 % — ABNORMAL HIGH (ref 11.5–15.5)
WBC: 13.3 10*3/uL — ABNORMAL HIGH (ref 4.0–10.5)
nRBC: 0.2 % (ref 0.0–0.2)

## 2021-09-03 LAB — COMPREHENSIVE METABOLIC PANEL
ALT: 16 U/L (ref 0–44)
AST: 23 U/L (ref 15–41)
Albumin: 3 g/dL — ABNORMAL LOW (ref 3.5–5.0)
Alkaline Phosphatase: 77 U/L (ref 38–126)
Anion gap: 11 (ref 5–15)
BUN: 34 mg/dL — ABNORMAL HIGH (ref 6–20)
CO2: 25 mmol/L (ref 22–32)
Calcium: 8.1 mg/dL — ABNORMAL LOW (ref 8.9–10.3)
Chloride: 95 mmol/L — ABNORMAL LOW (ref 98–111)
Creatinine, Ser: 7.16 mg/dL — ABNORMAL HIGH (ref 0.44–1.00)
GFR, Estimated: 6 mL/min — ABNORMAL LOW (ref >60)
Glucose, Bld: 86 mg/dL (ref 70–99)
Potassium: 3.6 mmol/L (ref 3.5–5.1)
Sodium: 131 mmol/L — ABNORMAL LOW (ref 135–145)
Total Bilirubin: 0.4 mg/dL (ref 0.3–1.2)
Total Protein: 7.7 g/dL (ref 6.5–8.1)

## 2021-09-03 LAB — TROPONIN I (HIGH SENSITIVITY)
Troponin I (High Sensitivity): 29 ng/L — ABNORMAL HIGH (ref <18)
Troponin I (High Sensitivity): 27 ng/L — ABNORMAL HIGH (ref <18)

## 2021-09-03 LAB — LIPASE, BLOOD: Lipase: 45 U/L (ref 11–51)

## 2021-09-03 MED ORDER — HYDROCODONE-ACETAMINOPHEN 5-325 MG PO TABS
1.0000 | ORAL_TABLET | ORAL | Status: DC | PRN
  Administered 2021-09-03 – 2021-09-04 (×3): 2 via ORAL
  Filled 2021-09-03 (×3): qty 2

## 2021-09-03 MED ORDER — SODIUM CHLORIDE 0.9 % IV BOLUS
500.0000 mL | Freq: Once | INTRAVENOUS | Status: AC
  Administered 2021-09-03: 500 mL via INTRAVENOUS

## 2021-09-03 NOTE — Telephone Encounter (Signed)
Pt called with c/o "skin pulling away" from staples s/p R AKA. She was offered an appt later today but is in HD this afternoon. Pt is going to check with her transportation about coming in tomorrow for an appt. Here.

## 2021-09-03 NOTE — ED Notes (Signed)
X-ray at bedside

## 2021-09-03 NOTE — ED Triage Notes (Addendum)
Pt to the ED via [facility] from home with complaints of abdominal pain with nausea and decreased appetite.   Pt reports having a Rt AKA performed 2 weeks ago and states she feels like the staples are coming apart. There is no drainage and staples seem to be intact.  Pt reports weakness and lethargy for the past 2*-3 days. The pt is on chronic dialysis with her treatment days being Tuesday, Thursday, and Saturday. The pt did receive dialysis today but left one hour early due to feeling unwell.  The patient wears 3-4 L [place] at home. Pt is also seeking SNF placement as she has no one who can care for her with growing health issues.

## 2021-09-03 NOTE — ED Notes (Signed)
Pt is a dialysis pt and does not produce urine, per pt. Unable to obtain urine sample at this time

## 2021-09-03 NOTE — ED Notes (Addendum)
Graham crackers and gingerale given with pain medications per pt request O2 sensor put on left hand per pt request

## 2021-09-03 NOTE — ED Provider Notes (Signed)
[facility] Provider Note   CSN: [id number] Arrival date & time: 09/03/21  1729     History  Chief Complaint  Patient presents with   Fatigue    Courtney Gray is a 51 y.o. female.  Pt is a 51 yo with pmh of * presenting for weakness and fatigue. 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 Carlis Abbott. Admits to nausea without vomiting. Denies fevers. States wound has mucous/flesh appearance. No purulent drainage or new swelling.   The history is provided by the patient. No language interpreter was used.  Abdominal Pain Associated symptoms: fatigue and nausea   Associated symptoms: no chest pain, no chills, no cough, no dysuria, no fever, no hematuria, no shortness of breath, no sore throat and no vomiting       Home Medications Prior to Admission medications   Medication Sig Start Date End Date Taking? Authorizing Provider  acetaminophen (TYLENOL) 500 MG tablet Take 1,000 mg by mouth every 6 (six) hours as needed for moderate pain or headache.    [provider]  amLODipine (NORVASC) 10 MG tablet Take 1 tablet (10 mg total) by mouth daily. Patient taking differently: Take 10 mg by mouth daily as needed (Blood pressusre). 11/29/20   Roxan Hockey, MD  aspirin EC 81 MG tablet Take 1 tablet (81 mg total) by mouth daily with breakfast. 11/28/20   Denton Brick, Courage, MD  atorvastatin (LIPITOR) 10 MG tablet Take 1 tablet (10 mg total) by mouth daily. Patient taking differently: Take 10 mg by mouth at bedtime. 11/28/20   Roxan Hockey, MD  calcium carbonate (TUMS SMOOTHIES) 750 MG chewable tablet Chew 750-1,500 mg by mouth See admin instructions. Take 1500 mg with each meal and 750 mg with each snack    [provider]  carvedilol (COREG) 6.25 MG tablet Take 1 tablet (6.25 mg total) by mouth 2 (two) times daily with a meal. 08/28/21 09/27/21  Darliss Cheney, MD   diphenhydrAMINE (BENADRYL) 25 MG tablet Take 25 mg by mouth daily as needed for allergies or sleep.    [provider]  docusate sodium (COLACE) 100 MG capsule Take 100 mg by mouth 2 (two) times daily.    [provider]  doxycycline (VIBRAMYCIN) 100 MG capsule Take 100 mg by mouth 2 (two) times daily. 08/30/21   [provider]  gabapentin (NEURONTIN) 100 MG capsule Take 1 capsule (100 mg total) by mouth 2 (two) times daily. 08/28/21 09/27/21  Darliss Cheney, MD  glipiZIDE (GLUCOTROL XL) 2.5 MG 24 hr tablet Take 1 tablet (2.5 mg total) by mouth daily with breakfast. 08/28/21 09/27/21  Darliss Cheney, MD  HYDROcodone-acetaminophen (NORCO/VICODIN) 5-325 MG tablet Take 1-2 tablets by mouth every 4 (four) hours as needed for moderate pain. 08/19/21   Darliss Cheney, MD  lisinopril (ZESTRIL) 20 MG tablet Take 1 tablet (20 mg total) by mouth daily. 08/28/21 09/27/21  Darliss Cheney, MD  loperamide (IMODIUM A-D) 2 MG tablet Take 4 mg by mouth as needed for diarrhea or loose stools.    [provider]  nitroGLYCERIN (NITROSTAT) 0.4 MG SL tablet Place 1 tablet (0.4 mg total) under the tongue every 5 (five) minutes as needed for chest pain. 05/24/21   [place] Eric J, DO  pantoprazole (PROTONIX) 40 MG tablet Take 1 tablet (40 mg total) by mouth daily. 11/29/20   Roxan Hockey, MD  simethicone (MYLICON) 301 MG chewable tablet Chew  125 mg by mouth 2 (two) times daily as needed for flatulence.    [provider]  traMADol (ULTRAM) 50 MG tablet Take 1 tablet (50 mg total) by mouth every 6 (six) hours as needed for moderate pain. 08/28/21   Darliss Cheney, MD      Allergies    Benzonatate, Hydralazine, Amoxicillin, Clonidine, Chlorhexidine, Morphine, and Oxycodone    Review of Systems   Review of Systems  Constitutional:  Positive for fatigue. Negative for chills and fever.  HENT:  Negative for ear pain and sore throat.   Eyes:  Negative for pain and visual disturbance.   Respiratory:  Negative for cough and shortness of breath.   Cardiovascular:  Negative for chest pain and palpitations.  Gastrointestinal:  Positive for nausea. Negative for abdominal pain and vomiting.  Genitourinary:  Negative for dysuria and hematuria.  Musculoskeletal:  Negative for arthralgias and back pain.  Skin:  Negative for color change and rash.  Neurological:  Positive for weakness. Negative for seizures and syncope.  All other systems reviewed and are negative.  Physical Exam Updated Vital Signs BP (!) 136/111   Pulse (!) 57   Temp 97.9 F (36.6 C) (Oral)   Resp 19   Ht 5\' 5"  (1.651 m)   Wt (!) 150.6 kg   SpO2 100%   BMI 55.25 kg/m  Physical Exam Vitals and nursing note reviewed.  Constitutional:      General: She is not in acute distress.    Appearance: She is well-developed. She is morbidly obese.  HENT:     Head: Normocephalic and atraumatic.  Eyes:     Conjunctiva/sclera: Conjunctivae normal.  Cardiovascular:     Rate and Rhythm: Normal rate and regular rhythm.     Heart sounds: No murmur heard. Pulmonary:     Effort: Pulmonary effort is normal. No respiratory distress.     Breath sounds: Normal breath sounds.  Abdominal:     Palpations: Abdomen is soft.     Tenderness: There is no abdominal tenderness.  Musculoskeletal:        General: No swelling.     Cervical back: Neck supple.       Legs:  Skin:    General: Skin is warm and dry.     Capillary Refill: Capillary refill takes less than 2 seconds.  Neurological:     Mental Status: She is alert and oriented to person, place, and time.     GCS: GCS eye subscore is 4. GCS verbal subscore is 5. GCS motor subscore is 6.  Psychiatric:        Mood and Affect: Mood normal.    ED Results / Procedures / Treatments   Labs (all labs ordered are listed, but only abnormal results are displayed) Labs Reviewed  CBC WITH DIFFERENTIAL/PLATELET - Abnormal; Notable for the following components:      Result  Value   WBC 13.3 (*)    RBC 3.05 (*)    Hemoglobin 8.6 (*)    HCT 29.5 (*)    MCHC 29.2 (*)    RDW 18.9 (*)    Neutro Abs 10.6 (*)    Abs Immature Granulocytes 0.10 (*)    All other components within normal limits  COMPREHENSIVE METABOLIC PANEL - Abnormal; Notable for the following components:   Sodium 131 (*)    Chloride 95 (*)    BUN 34 (*)    Creatinine, Ser 7.16 (*)    Calcium 8.1 (*)    Albumin  3.0 (*)    GFR, Estimated 6 (*)    All other components within normal limits  TROPONIN I (HIGH SENSITIVITY) - Abnormal; Notable for the following components:   Troponin I (High Sensitivity) 29 (*)    All other components within normal limits  LIPASE, BLOOD  URINALYSIS, ROUTINE W REFLEX MICROSCOPIC  TROPONIN I (HIGH SENSITIVITY)    EKG None  Radiology DG Chest Portable 1 View  Result Date: 09/03/2021 CLINICAL DATA:  Cough. EXAM: PORTABLE CHEST 1 VIEW COMPARISON:  Chest x-ray 07/09/2021 FINDINGS: Right-sided central venous catheter tip projects over the distal SVC, unchanged. The heart is enlarged, unchanged. Lungs and costophrenic angles are clear. No pleural effusion or pneumothorax. No acute fractures. IMPRESSION: 1. No acute cardiopulmonary process. 2. Stable cardiomegaly. Electronically Signed   By: Ronney Asters M.D.   On: 09/03/2021 21:30    Procedures Procedures    Medications Ordered in ED Medications  HYDROcodone-acetaminophen (NORCO/VICODIN) 5-325 MG per tablet 1-2 tablet (2 tablets Oral Given 09/03/21 2145)  sodium chloride 0.9 % bolus 500 mL (500 mLs Intravenous New Bag/Given 09/03/21 2132)    ED Course/ Medical Decision Making/ A&P                           Medical Decision Making Amount and/or Complexity of Data Reviewed Labs: ordered. Radiology: ordered.  Risk Prescription drug management.   Patient is a 52 year old female presenting for weakness and fatigue.  Patient is alert and oriented x3, no acute distress, afebrile, stable vital signs.    Has  stable EKG with no ST segment elevation or depression.  Troponin elevated at 9.  Previous studies in February of this year demonstrates troponins in 600s ending down to 300s.  Patient denies any chest pain or shortness of breath this time.  Delta troponin pending.  Post week 2 for above-the-knee amputation of the right leg.  Wound appears well with minimal wound dehiscence.  No purulent drainage, swelling, warmth, or erythema.  Low suspicion section.  No signs or symptoms of sepsis.  Also admits to cough.  Chest x-ray demonstrates no aspiration pneumonia.  Patient signed out to oncoming physician while pending delta trop.         Final Clinical Impression(s) / ED Diagnoses Final diagnoses:  Generalized weakness  Decreased appetite    Rx / DC Orders ED Discharge Orders     None         Lianne Cure, DO 70/17/79 2300

## 2021-09-04 ENCOUNTER — Encounter: Payer: Self-pay | Admitting: Physician Assistant

## 2021-09-04 ENCOUNTER — Ambulatory Visit (INDEPENDENT_AMBULATORY_CARE_PROVIDER_SITE_OTHER): Payer: 59 | Admitting: Physician Assistant

## 2021-09-04 DIAGNOSIS — I739 Peripheral vascular disease, unspecified: Secondary | ICD-10-CM

## 2021-09-04 DIAGNOSIS — R531 Weakness: Secondary | ICD-10-CM | POA: Diagnosis not present

## 2021-09-04 LAB — SARS CORONAVIRUS 2 BY RT PCR: SARS Coronavirus 2 by RT PCR: NEGATIVE

## 2021-09-04 NOTE — Progress Notes (Signed)
POST OPERATIVE OFFICE NOTE    CC:  F/u for surgery  HPI:  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  She called yesterday with reports of stump incision separation.  She then went to the [facility] ED yesterday @2100  hour.  Her CC was feeling weak since surgery.  She was examined and discharged in stable.      Pt states she is afraid her right AKA incision is falling apart.  She denies fever and chills, no drainage.  She has home health working with her 1 time a week.  Her daughter is about to start work and they are considering SNF placement so she won't be at home alone.   Allergies  Allergen Reactions   Benzonatate Other (See Comments)    Hallucinations   Hydralazine Other (See Comments)    Hallucinations   Amoxicillin Nausea And Vomiting   Clonidine Other (See Comments)    Altered mental state   Chlorhexidine Itching   Morphine Itching   Oxycodone Itching    Pt tolerated hospital admission 07/2021    Current Outpatient Medications  Medication Sig Dispense Refill   acetaminophen (TYLENOL) 500 MG tablet Take 1,000 mg by mouth every 6 (six) hours as needed for moderate pain or headache.     amLODipine (NORVASC) 10 MG tablet Take 1 tablet (10 mg total) by mouth daily. (Patient taking differently: Take 10 mg by mouth daily as needed (Blood pressusre).) 30 tablet 5   aspirin EC 81 MG tablet Take 1 tablet (81 mg total) by mouth daily with breakfast. 30 tablet 11   atorvastatin (LIPITOR) 10 MG tablet Take 1 tablet (10 mg total) by mouth daily. (Patient taking differently: Take 10 mg by mouth at bedtime.) 30 tablet 5   calcium carbonate (TUMS SMOOTHIES) 750 MG chewable tablet Chew 750-1,500 mg by mouth See admin instructions. Take 1500 mg with each meal and 750 mg with each snack     carvedilol (COREG) 6.25 MG tablet Take 1 tablet (6.25 mg total) by mouth 2 (two) times daily with a meal. 60 tablet 0   diphenhydrAMINE  (BENADRYL) 25 MG tablet Take 25 mg by mouth daily as needed for allergies or sleep.     docusate sodium (COLACE) 100 MG capsule Take 100 mg by mouth 2 (two) times daily.     doxycycline (VIBRAMYCIN) 100 MG capsule Take 100 mg by mouth 2 (two) times daily.     gabapentin (NEURONTIN) 100 MG capsule Take 1 capsule (100 mg total) by mouth 2 (two) times daily. 60 capsule 0   glipiZIDE (GLUCOTROL XL) 2.5 MG 24 hr tablet Take 1 tablet (2.5 mg total) by mouth daily with breakfast. 30 tablet 0   HYDROcodone-acetaminophen (NORCO/VICODIN) 5-325 MG tablet Take 1-2 tablets by mouth every 4 (four) hours as needed for moderate pain. 20 tablet 0   lisinopril (ZESTRIL) 20 MG tablet Take 1 tablet (20 mg total) by mouth daily. 30 tablet 0   loperamide (IMODIUM A-D) 2 MG tablet Take 4 mg by mouth as needed for diarrhea or loose stools.     nitroGLYCERIN (NITROSTAT) 0.4 MG SL tablet Place 1 tablet (0.4 mg total) under the tongue every 5 (five) minutes as needed for chest pain. 10 tablet 3   pantoprazole (PROTONIX) 40 MG tablet Take 1 tablet (40 mg total) by mouth daily. 30 tablet 5   simethicone (MYLICON) 517 MG chewable tablet Chew 125 mg by mouth  2 (two) times daily as needed for flatulence.     traMADol (ULTRAM) 50 MG tablet Take 1 tablet (50 mg total) by mouth every 6 (six) hours as needed for moderate pain. 20 tablet 0   No current facility-administered medications for this visit.   Facility-Administered Medications Ordered in Other Visits  Medication Dose Route Frequency Provider Last Rate Last Admin   HYDROcodone-acetaminophen (NORCO/VICODIN) 5-325 MG per tablet 1-2 tablet  1-2 tablet Oral X5L PRN Lianne Cure, DO   2 tablet at 09/04/21 0845     ROS:  See HPI  Physical Exam:     No drainage or dehiscence of right AKA.      Assessment/Plan:  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    She was reassured that her right AKA is healing well and  she can wash it with soap and water.   She will f/u in 2-3 weeks for incision check and staple removal.     Courtney Gray. [name] [phone]   Clinic MD:  Donzetta Matters

## 2021-09-04 NOTE — ED Notes (Signed)
Assisted pt dress the ulcer on her 3rd digit on left hand

## 2021-09-04 NOTE — ED Notes (Signed)
C-com notified of patient needing transportation back home. 

## 2021-09-04 NOTE — ED Notes (Signed)
Assisted pt in plugging up pt's phone to charge. Pt is comfortable and resting peacefully at this time

## 2021-09-04 NOTE — ED Notes (Signed)
EDP at bedside  

## 2021-09-04 NOTE — ED Notes (Signed)
CSW updated by ED staff that EMS is here to pick pt up for D/C and that pt is aware she is being discharged home.

## 2021-09-04 NOTE — ED Notes (Signed)
Pt requested to speak to EDP regarding why pt was not tested for COVID despite symptoms and why pt is going home when [facility] This RN spoke to EDP. EDP will speak with pt when available

## 2021-09-09 ENCOUNTER — Inpatient Hospital Stay (HOSPITAL_COMMUNITY)
Admission: EM | Admit: 2021-09-09 | Discharge: 2021-10-21 | DRG: 853 | Disposition: A | Discharge status: Skilled Nursing Facility | Payer: 59 | Attending: Internal Medicine | Admitting: Internal Medicine

## 2021-09-09 ENCOUNTER — Encounter (HOSPITAL_COMMUNITY): Payer: Self-pay

## 2021-09-09 ENCOUNTER — Other Ambulatory Visit: Payer: Self-pay

## 2021-09-09 DIAGNOSIS — I96 Gangrene, not elsewhere classified: Secondary | ICD-10-CM

## 2021-09-09 DIAGNOSIS — D72829 Elevated white blood cell count, unspecified: Secondary | ICD-10-CM | POA: Diagnosis present

## 2021-09-09 DIAGNOSIS — R6521 Severe sepsis with septic shock: Secondary | ICD-10-CM | POA: Diagnosis present

## 2021-09-09 DIAGNOSIS — R778 Other specified abnormalities of plasma proteins: Secondary | ICD-10-CM | POA: Diagnosis present

## 2021-09-09 DIAGNOSIS — E11622 Type 2 diabetes mellitus with other skin ulcer: Secondary | ICD-10-CM | POA: Diagnosis present

## 2021-09-09 DIAGNOSIS — K581 Irritable bowel syndrome with constipation: Secondary | ICD-10-CM | POA: Diagnosis present

## 2021-09-09 DIAGNOSIS — N2581 Secondary hyperparathyroidism of renal origin: Secondary | ICD-10-CM | POA: Diagnosis present

## 2021-09-09 DIAGNOSIS — I2510 Atherosclerotic heart disease of native coronary artery without angina pectoris: Secondary | ICD-10-CM | POA: Diagnosis present

## 2021-09-09 DIAGNOSIS — T4275XA Adverse effect of unspecified antiepileptic and sedative-hypnotic drugs, initial encounter: Secondary | ICD-10-CM | POA: Diagnosis not present

## 2021-09-09 DIAGNOSIS — J189 Pneumonia, unspecified organism: Secondary | ICD-10-CM | POA: Diagnosis present

## 2021-09-09 DIAGNOSIS — E162 Hypoglycemia, unspecified: Secondary | ICD-10-CM

## 2021-09-09 DIAGNOSIS — I10 Essential (primary) hypertension: Secondary | ICD-10-CM | POA: Diagnosis present

## 2021-09-09 DIAGNOSIS — I76 Septic arterial embolism: Secondary | ICD-10-CM | POA: Diagnosis present

## 2021-09-09 DIAGNOSIS — R627 Adult failure to thrive: Secondary | ICD-10-CM | POA: Diagnosis present

## 2021-09-09 DIAGNOSIS — R230 Cyanosis: Secondary | ICD-10-CM | POA: Diagnosis present

## 2021-09-09 DIAGNOSIS — I952 Hypotension due to drugs: Secondary | ICD-10-CM | POA: Diagnosis not present

## 2021-09-09 DIAGNOSIS — Z7984 Long term (current) use of oral hypoglycemic drugs: Secondary | ICD-10-CM

## 2021-09-09 DIAGNOSIS — Z9981 Dependence on supplemental oxygen: Secondary | ICD-10-CM

## 2021-09-09 DIAGNOSIS — E46 Unspecified protein-calorie malnutrition: Secondary | ICD-10-CM | POA: Diagnosis not present

## 2021-09-09 DIAGNOSIS — E782 Mixed hyperlipidemia: Secondary | ICD-10-CM | POA: Diagnosis present

## 2021-09-09 DIAGNOSIS — I248 Other forms of acute ischemic heart disease: Secondary | ICD-10-CM | POA: Diagnosis present

## 2021-09-09 DIAGNOSIS — R7989 Other specified abnormal findings of blood chemistry: Secondary | ICD-10-CM | POA: Diagnosis present

## 2021-09-09 DIAGNOSIS — I083 Combined rheumatic disorders of mitral, aortic and tricuspid valves: Secondary | ICD-10-CM | POA: Diagnosis present

## 2021-09-09 DIAGNOSIS — Z794 Long term (current) use of insulin: Secondary | ICD-10-CM

## 2021-09-09 DIAGNOSIS — L89312 Pressure ulcer of right buttock, stage 2: Secondary | ICD-10-CM | POA: Diagnosis present

## 2021-09-09 DIAGNOSIS — L89322 Pressure ulcer of left buttock, stage 2: Secondary | ICD-10-CM | POA: Diagnosis present

## 2021-09-09 DIAGNOSIS — N186 End stage renal disease: Secondary | ICD-10-CM | POA: Diagnosis present

## 2021-09-09 DIAGNOSIS — J9621 Acute and chronic respiratory failure with hypoxia: Secondary | ICD-10-CM | POA: Diagnosis not present

## 2021-09-09 DIAGNOSIS — Z885 Allergy status to narcotic agent status: Secondary | ICD-10-CM

## 2021-09-09 DIAGNOSIS — Z6841 Body Mass Index (BMI) 40.0 and over, adult: Secondary | ICD-10-CM

## 2021-09-09 DIAGNOSIS — Z881 Allergy status to other antibiotic agents status: Secondary | ICD-10-CM

## 2021-09-09 DIAGNOSIS — J9811 Atelectasis: Secondary | ICD-10-CM | POA: Diagnosis not present

## 2021-09-09 DIAGNOSIS — I5032 Chronic diastolic (congestive) heart failure: Secondary | ICD-10-CM | POA: Diagnosis present

## 2021-09-09 DIAGNOSIS — R269 Unspecified abnormalities of gait and mobility: Secondary | ICD-10-CM | POA: Diagnosis present

## 2021-09-09 DIAGNOSIS — Y92239 Unspecified place in hospital as the place of occurrence of the external cause: Secondary | ICD-10-CM | POA: Diagnosis not present

## 2021-09-09 DIAGNOSIS — M898X9 Other specified disorders of bone, unspecified site: Secondary | ICD-10-CM | POA: Diagnosis present

## 2021-09-09 DIAGNOSIS — Z992 Dependence on renal dialysis: Secondary | ICD-10-CM

## 2021-09-09 DIAGNOSIS — E1140 Type 2 diabetes mellitus with diabetic neuropathy, unspecified: Secondary | ICD-10-CM | POA: Diagnosis present

## 2021-09-09 DIAGNOSIS — E1169 Type 2 diabetes mellitus with other specified complication: Secondary | ICD-10-CM | POA: Diagnosis present

## 2021-09-09 DIAGNOSIS — Z89611 Acquired absence of right leg above knee: Secondary | ICD-10-CM

## 2021-09-09 DIAGNOSIS — Z833 Family history of diabetes mellitus: Secondary | ICD-10-CM

## 2021-09-09 DIAGNOSIS — I252 Old myocardial infarction: Secondary | ICD-10-CM

## 2021-09-09 DIAGNOSIS — E872 Acidosis, unspecified: Secondary | ICD-10-CM | POA: Diagnosis present

## 2021-09-09 DIAGNOSIS — A4159 Other Gram-negative sepsis: Principal | ICD-10-CM | POA: Diagnosis present

## 2021-09-09 DIAGNOSIS — E662 Morbid (severe) obesity with alveolar hypoventilation: Secondary | ICD-10-CM | POA: Diagnosis present

## 2021-09-09 DIAGNOSIS — I2584 Coronary atherosclerosis due to calcified coronary lesion: Secondary | ICD-10-CM | POA: Diagnosis present

## 2021-09-09 DIAGNOSIS — D735 Infarction of spleen: Secondary | ICD-10-CM | POA: Diagnosis not present

## 2021-09-09 DIAGNOSIS — E11649 Type 2 diabetes mellitus with hypoglycemia without coma: Secondary | ICD-10-CM | POA: Diagnosis present

## 2021-09-09 DIAGNOSIS — K2970 Gastritis, unspecified, without bleeding: Secondary | ICD-10-CM | POA: Diagnosis not present

## 2021-09-09 DIAGNOSIS — K219 Gastro-esophageal reflux disease without esophagitis: Secondary | ICD-10-CM | POA: Diagnosis present

## 2021-09-09 DIAGNOSIS — Z888 Allergy status to other drugs, medicaments and biological substances status: Secondary | ICD-10-CM

## 2021-09-09 DIAGNOSIS — Z7982 Long term (current) use of aspirin: Secondary | ICD-10-CM

## 2021-09-09 DIAGNOSIS — Z880 Allergy status to penicillin: Secondary | ICD-10-CM

## 2021-09-09 DIAGNOSIS — E871 Hypo-osmolality and hyponatremia: Secondary | ICD-10-CM | POA: Diagnosis not present

## 2021-09-09 DIAGNOSIS — Q2112 Patent foramen ovale: Secondary | ICD-10-CM

## 2021-09-09 DIAGNOSIS — D62 Acute posthemorrhagic anemia: Secondary | ICD-10-CM | POA: Diagnosis not present

## 2021-09-09 DIAGNOSIS — D733 Abscess of spleen: Secondary | ICD-10-CM | POA: Diagnosis present

## 2021-09-09 DIAGNOSIS — L98499 Non-pressure chronic ulcer of skin of other sites with unspecified severity: Secondary | ICD-10-CM | POA: Diagnosis present

## 2021-09-09 DIAGNOSIS — Z79899 Other long term (current) drug therapy: Secondary | ICD-10-CM

## 2021-09-09 DIAGNOSIS — Z7401 Bed confinement status: Secondary | ICD-10-CM

## 2021-09-09 DIAGNOSIS — R1012 Left upper quadrant pain: Secondary | ICD-10-CM

## 2021-09-09 DIAGNOSIS — N920 Excessive and frequent menstruation with regular cycle: Secondary | ICD-10-CM | POA: Diagnosis present

## 2021-09-09 DIAGNOSIS — A419 Sepsis, unspecified organism: Secondary | ICD-10-CM

## 2021-09-09 DIAGNOSIS — M109 Gout, unspecified: Secondary | ICD-10-CM | POA: Diagnosis present

## 2021-09-09 DIAGNOSIS — E1122 Type 2 diabetes mellitus with diabetic chronic kidney disease: Secondary | ICD-10-CM | POA: Diagnosis present

## 2021-09-09 DIAGNOSIS — J9611 Chronic respiratory failure with hypoxia: Secondary | ICD-10-CM | POA: Diagnosis present

## 2021-09-09 DIAGNOSIS — I739 Peripheral vascular disease, unspecified: Secondary | ICD-10-CM

## 2021-09-09 DIAGNOSIS — D631 Anemia in chronic kidney disease: Secondary | ICD-10-CM | POA: Diagnosis present

## 2021-09-09 DIAGNOSIS — I82C13 Acute embolism and thrombosis of internal jugular vein, bilateral: Secondary | ICD-10-CM | POA: Diagnosis present

## 2021-09-09 DIAGNOSIS — L8990 Pressure ulcer of unspecified site, unspecified stage: Secondary | ICD-10-CM | POA: Insufficient documentation

## 2021-09-09 DIAGNOSIS — K750 Abscess of liver: Secondary | ICD-10-CM | POA: Diagnosis not present

## 2021-09-09 DIAGNOSIS — E1152 Type 2 diabetes mellitus with diabetic peripheral angiopathy with gangrene: Secondary | ICD-10-CM | POA: Diagnosis present

## 2021-09-09 DIAGNOSIS — R188 Other ascites: Secondary | ICD-10-CM | POA: Diagnosis present

## 2021-09-09 DIAGNOSIS — I959 Hypotension, unspecified: Secondary | ICD-10-CM | POA: Diagnosis present

## 2021-09-09 DIAGNOSIS — I132 Hypertensive heart and chronic kidney disease with heart failure and with stage 5 chronic kidney disease, or end stage renal disease: Secondary | ICD-10-CM | POA: Diagnosis present

## 2021-09-09 DIAGNOSIS — Z91199 Patient's noncompliance with other medical treatment and regimen due to unspecified reason: Secondary | ICD-10-CM

## 2021-09-10 ENCOUNTER — Inpatient Hospital Stay (HOSPITAL_COMMUNITY): Payer: 59

## 2021-09-10 ENCOUNTER — Encounter (HOSPITAL_COMMUNITY): Payer: Self-pay | Admitting: Internal Medicine

## 2021-09-10 ENCOUNTER — Emergency Department (HOSPITAL_COMMUNITY): Payer: 59

## 2021-09-10 DIAGNOSIS — K750 Abscess of liver: Secondary | ICD-10-CM | POA: Diagnosis not present

## 2021-09-10 DIAGNOSIS — E1169 Type 2 diabetes mellitus with other specified complication: Secondary | ICD-10-CM

## 2021-09-10 DIAGNOSIS — D733 Abscess of spleen: Secondary | ICD-10-CM | POA: Diagnosis not present

## 2021-09-10 DIAGNOSIS — D735 Infarction of spleen: Secondary | ICD-10-CM | POA: Diagnosis present

## 2021-09-10 DIAGNOSIS — Z794 Long term (current) use of insulin: Secondary | ICD-10-CM

## 2021-09-10 DIAGNOSIS — A4159 Other Gram-negative sepsis: Secondary | ICD-10-CM | POA: Diagnosis present

## 2021-09-10 DIAGNOSIS — M87045 Idiopathic aseptic necrosis of left finger(s): Secondary | ICD-10-CM | POA: Diagnosis not present

## 2021-09-10 DIAGNOSIS — E162 Hypoglycemia, unspecified: Secondary | ICD-10-CM

## 2021-09-10 DIAGNOSIS — I248 Other forms of acute ischemic heart disease: Secondary | ICD-10-CM | POA: Diagnosis present

## 2021-09-10 DIAGNOSIS — Z992 Dependence on renal dialysis: Secondary | ICD-10-CM | POA: Diagnosis not present

## 2021-09-10 DIAGNOSIS — E872 Acidosis, unspecified: Secondary | ICD-10-CM

## 2021-09-10 DIAGNOSIS — I10 Essential (primary) hypertension: Secondary | ICD-10-CM

## 2021-09-10 DIAGNOSIS — D631 Anemia in chronic kidney disease: Secondary | ICD-10-CM | POA: Diagnosis present

## 2021-09-10 DIAGNOSIS — Y92239 Unspecified place in hospital as the place of occurrence of the external cause: Secondary | ICD-10-CM | POA: Diagnosis not present

## 2021-09-10 DIAGNOSIS — D62 Acute posthemorrhagic anemia: Secondary | ICD-10-CM | POA: Diagnosis not present

## 2021-09-10 DIAGNOSIS — R188 Other ascites: Secondary | ICD-10-CM | POA: Diagnosis present

## 2021-09-10 DIAGNOSIS — I2510 Atherosclerotic heart disease of native coronary artery without angina pectoris: Secondary | ICD-10-CM | POA: Diagnosis not present

## 2021-09-10 DIAGNOSIS — I340 Nonrheumatic mitral (valve) insufficiency: Secondary | ICD-10-CM | POA: Diagnosis not present

## 2021-09-10 DIAGNOSIS — J9621 Acute and chronic respiratory failure with hypoxia: Secondary | ICD-10-CM | POA: Diagnosis not present

## 2021-09-10 DIAGNOSIS — R778 Other specified abnormalities of plasma proteins: Secondary | ICD-10-CM

## 2021-09-10 DIAGNOSIS — E662 Morbid (severe) obesity with alveolar hypoventilation: Secondary | ICD-10-CM | POA: Diagnosis present

## 2021-09-10 DIAGNOSIS — E11649 Type 2 diabetes mellitus with hypoglycemia without coma: Secondary | ICD-10-CM

## 2021-09-10 DIAGNOSIS — J9611 Chronic respiratory failure with hypoxia: Secondary | ICD-10-CM

## 2021-09-10 DIAGNOSIS — L89322 Pressure ulcer of left buttock, stage 2: Secondary | ICD-10-CM | POA: Diagnosis present

## 2021-09-10 DIAGNOSIS — E1122 Type 2 diabetes mellitus with diabetic chronic kidney disease: Secondary | ICD-10-CM | POA: Diagnosis not present

## 2021-09-10 DIAGNOSIS — E1152 Type 2 diabetes mellitus with diabetic peripheral angiopathy with gangrene: Secondary | ICD-10-CM | POA: Diagnosis present

## 2021-09-10 DIAGNOSIS — I132 Hypertensive heart and chronic kidney disease with heart failure and with stage 5 chronic kidney disease, or end stage renal disease: Secondary | ICD-10-CM | POA: Diagnosis present

## 2021-09-10 DIAGNOSIS — N186 End stage renal disease: Secondary | ICD-10-CM

## 2021-09-10 DIAGNOSIS — I083 Combined rheumatic disorders of mitral, aortic and tricuspid valves: Secondary | ICD-10-CM | POA: Diagnosis present

## 2021-09-10 DIAGNOSIS — J189 Pneumonia, unspecified organism: Secondary | ICD-10-CM

## 2021-09-10 DIAGNOSIS — E782 Mixed hyperlipidemia: Secondary | ICD-10-CM

## 2021-09-10 DIAGNOSIS — I998 Other disorder of circulatory system: Secondary | ICD-10-CM | POA: Diagnosis not present

## 2021-09-10 DIAGNOSIS — Q2112 Patent foramen ovale: Secondary | ICD-10-CM | POA: Diagnosis not present

## 2021-09-10 DIAGNOSIS — I350 Nonrheumatic aortic (valve) stenosis: Secondary | ICD-10-CM | POA: Diagnosis not present

## 2021-09-10 DIAGNOSIS — I96 Gangrene, not elsewhere classified: Secondary | ICD-10-CM | POA: Diagnosis not present

## 2021-09-10 DIAGNOSIS — I361 Nonrheumatic tricuspid (valve) insufficiency: Secondary | ICD-10-CM | POA: Diagnosis not present

## 2021-09-10 DIAGNOSIS — N2581 Secondary hyperparathyroidism of renal origin: Secondary | ICD-10-CM | POA: Diagnosis present

## 2021-09-10 DIAGNOSIS — E46 Unspecified protein-calorie malnutrition: Secondary | ICD-10-CM | POA: Diagnosis not present

## 2021-09-10 DIAGNOSIS — E1151 Type 2 diabetes mellitus with diabetic peripheral angiopathy without gangrene: Secondary | ICD-10-CM | POA: Diagnosis not present

## 2021-09-10 DIAGNOSIS — R7989 Other specified abnormal findings of blood chemistry: Secondary | ICD-10-CM | POA: Diagnosis present

## 2021-09-10 DIAGNOSIS — I5032 Chronic diastolic (congestive) heart failure: Secondary | ICD-10-CM | POA: Diagnosis present

## 2021-09-10 DIAGNOSIS — I70262 Atherosclerosis of native arteries of extremities with gangrene, left leg: Secondary | ICD-10-CM | POA: Diagnosis not present

## 2021-09-10 DIAGNOSIS — J9811 Atelectasis: Secondary | ICD-10-CM | POA: Diagnosis not present

## 2021-09-10 DIAGNOSIS — R6521 Severe sepsis with septic shock: Secondary | ICD-10-CM | POA: Diagnosis present

## 2021-09-10 DIAGNOSIS — I76 Septic arterial embolism: Secondary | ICD-10-CM | POA: Diagnosis present

## 2021-09-10 DIAGNOSIS — Z7984 Long term (current) use of oral hypoglycemic drugs: Secondary | ICD-10-CM | POA: Diagnosis not present

## 2021-09-10 DIAGNOSIS — R7881 Bacteremia: Secondary | ICD-10-CM | POA: Diagnosis not present

## 2021-09-10 DIAGNOSIS — Z6841 Body Mass Index (BMI) 40.0 and over, adult: Secondary | ICD-10-CM | POA: Diagnosis not present

## 2021-09-10 DIAGNOSIS — R1012 Left upper quadrant pain: Secondary | ICD-10-CM

## 2021-09-10 DIAGNOSIS — L89312 Pressure ulcer of right buttock, stage 2: Secondary | ICD-10-CM | POA: Diagnosis present

## 2021-09-10 DIAGNOSIS — D72829 Elevated white blood cell count, unspecified: Secondary | ICD-10-CM | POA: Diagnosis not present

## 2021-09-10 DIAGNOSIS — A419 Sepsis, unspecified organism: Secondary | ICD-10-CM | POA: Diagnosis not present

## 2021-09-10 DIAGNOSIS — I959 Hypotension, unspecified: Secondary | ICD-10-CM | POA: Diagnosis present

## 2021-09-10 HISTORY — PX: IR US GUIDE VASC ACCESS RIGHT: IMG2390

## 2021-09-10 HISTORY — PX: IR VENO/JUGULAR RIGHT: IMG2275

## 2021-09-10 LAB — CBC WITH DIFFERENTIAL/PLATELET
Abs Immature Granulocytes: 0.05 10*3/uL (ref 0.00–0.07)
Basophils Absolute: 0 10*3/uL (ref 0.0–0.1)
Basophils Relative: 0 %
Eosinophils Absolute: 0.1 10*3/uL (ref 0.0–0.5)
Eosinophils Relative: 1 %
HCT: 32.3 % — ABNORMAL LOW (ref 36.0–46.0)
Hemoglobin: 9.5 g/dL — ABNORMAL LOW (ref 12.0–15.0)
Immature Granulocytes: 0 %
Lymphocytes Relative: 4 %
Lymphs Abs: 0.6 10*3/uL — ABNORMAL LOW (ref 0.7–4.0)
MCH: 27.9 pg (ref 26.0–34.0)
MCHC: 29.4 g/dL — ABNORMAL LOW (ref 30.0–36.0)
MCV: 95 fL (ref 80.0–100.0)
Monocytes Absolute: 0.4 10*3/uL (ref 0.1–1.0)
Monocytes Relative: 3 %
Neutro Abs: 13.1 10*3/uL — ABNORMAL HIGH (ref 1.7–7.7)
Neutrophils Relative %: 92 %
Platelets: 384 10*3/uL (ref 150–400)
RBC: 3.4 MIL/uL — ABNORMAL LOW (ref 3.87–5.11)
RDW: 18.6 % — ABNORMAL HIGH (ref 11.5–15.5)
WBC: 14.2 10*3/uL — ABNORMAL HIGH (ref 4.0–10.5)
nRBC: 0.1 % (ref 0.0–0.2)

## 2021-09-10 LAB — ECHOCARDIOGRAM COMPLETE
AR max vel: 0.81 cm2
AV Area VTI: 0.8 cm2
AV Area mean vel: 0.77 cm2
AV Mean grad: 12 mmHg
AV Peak grad: 22.9 mmHg
Ao pk vel: 2.4 m/s
Area-P 1/2: 2.96 cm2
Height: 65 in
MV M vel: 3.91 m/s
MV Peak grad: 61.3 mmHg
MV VTI: 0.99 cm2
S' Lateral: 3.9 cm
Weight: 5312.2 oz

## 2021-09-10 LAB — CBG MONITORING, ED
Glucose-Capillary: 124 mg/dL — ABNORMAL HIGH (ref 70–99)
Glucose-Capillary: 132 mg/dL — ABNORMAL HIGH (ref 70–99)
Glucose-Capillary: 139 mg/dL — ABNORMAL HIGH (ref 70–99)
Glucose-Capillary: 18 mg/dL — CL (ref 70–99)
Glucose-Capillary: 36 mg/dL — CL (ref 70–99)
Glucose-Capillary: 41 mg/dL — CL (ref 70–99)
Glucose-Capillary: 44 mg/dL — CL (ref 70–99)
Glucose-Capillary: 50 mg/dL — ABNORMAL LOW (ref 70–99)
Glucose-Capillary: 61 mg/dL — ABNORMAL LOW (ref 70–99)
Glucose-Capillary: 62 mg/dL — ABNORMAL LOW (ref 70–99)

## 2021-09-10 LAB — I-STAT CHEM 8, ED
BUN: 48 mg/dL — ABNORMAL HIGH (ref 6–20)
Calcium, Ion: 0.95 mmol/L — ABNORMAL LOW (ref 1.15–1.40)
Chloride: 96 mmol/L — ABNORMAL LOW (ref 98–111)
Creatinine, Ser: 11.2 mg/dL — ABNORMAL HIGH (ref 0.44–1.00)
Glucose, Bld: 39 mg/dL — CL (ref 70–99)
HCT: 35 % — ABNORMAL LOW (ref 36.0–46.0)
Hemoglobin: 11.9 g/dL — ABNORMAL LOW (ref 12.0–15.0)
Potassium: 3.3 mmol/L — ABNORMAL LOW (ref 3.5–5.1)
Sodium: 135 mmol/L (ref 135–145)
TCO2: 25 mmol/L (ref 22–32)

## 2021-09-10 LAB — BASIC METABOLIC PANEL WITH GFR
Anion gap: 20 — ABNORMAL HIGH (ref 5–15)
BUN: 57 mg/dL — ABNORMAL HIGH (ref 6–20)
CO2: 21 mmol/L — ABNORMAL LOW (ref 22–32)
Calcium: 8.2 mg/dL — ABNORMAL LOW (ref 8.9–10.3)
Chloride: 94 mmol/L — ABNORMAL LOW (ref 98–111)
Creatinine, Ser: 10.95 mg/dL — ABNORMAL HIGH (ref 0.44–1.00)
GFR, Estimated: 4 mL/min — ABNORMAL LOW (ref >60)
Glucose, Bld: <20 mg/dL — CL (ref 70–99)
Potassium: 4.1 mmol/L (ref 3.5–5.1)
Sodium: 135 mmol/L (ref 135–145)

## 2021-09-10 LAB — CBC
HCT: 30.9 % — ABNORMAL LOW (ref 36.0–46.0)
Hemoglobin: 8.8 g/dL — ABNORMAL LOW (ref 12.0–15.0)
MCH: 27.7 pg (ref 26.0–34.0)
MCHC: 28.5 g/dL — ABNORMAL LOW (ref 30.0–36.0)
MCV: 97.2 fL (ref 80.0–100.0)
Platelets: 325 10*3/uL (ref 150–400)
RBC: 3.18 MIL/uL — ABNORMAL LOW (ref 3.87–5.11)
RDW: 18.6 % — ABNORMAL HIGH (ref 11.5–15.5)
WBC: 36 10*3/uL — ABNORMAL HIGH (ref 4.0–10.5)
nRBC: 0 % (ref 0.0–0.2)

## 2021-09-10 LAB — RENAL FUNCTION PANEL
Albumin: 2.5 g/dL — ABNORMAL LOW (ref 3.5–5.0)
Anion gap: 18 — ABNORMAL HIGH (ref 5–15)
BUN: 54 mg/dL — ABNORMAL HIGH (ref 6–20)
CO2: 19 mmol/L — ABNORMAL LOW (ref 22–32)
Calcium: 8 mg/dL — ABNORMAL LOW (ref 8.9–10.3)
Chloride: 97 mmol/L — ABNORMAL LOW (ref 98–111)
Creatinine, Ser: 10.7 mg/dL — ABNORMAL HIGH (ref 0.44–1.00)
GFR, Estimated: 4 mL/min — ABNORMAL LOW (ref >60)
Glucose, Bld: 75 mg/dL (ref 70–99)
Phosphorus: 5.5 mg/dL — ABNORMAL HIGH (ref 2.5–4.6)
Potassium: 3.3 mmol/L — ABNORMAL LOW (ref 3.5–5.1)
Sodium: 134 mmol/L — ABNORMAL LOW (ref 135–145)

## 2021-09-10 LAB — LACTIC ACID, PLASMA
Lactic Acid, Venous: 3.7 mmol/L (ref 0.5–1.9)
Lactic Acid, Venous: 2.2 mmol/L (ref 0.5–1.9)

## 2021-09-10 LAB — GLUCOSE, CAPILLARY
Glucose-Capillary: 119 mg/dL — ABNORMAL HIGH (ref 70–99)
Glucose-Capillary: 17 mg/dL — CL (ref 70–99)
Glucose-Capillary: 32 mg/dL — CL (ref 70–99)
Glucose-Capillary: 37 mg/dL — CL (ref 70–99)
Glucose-Capillary: 39 mg/dL — CL (ref 70–99)
Glucose-Capillary: 47 mg/dL — ABNORMAL LOW (ref 70–99)
Glucose-Capillary: 48 mg/dL — ABNORMAL LOW (ref 70–99)
Glucose-Capillary: 82 mg/dL (ref 70–99)
Glucose-Capillary: 93 mg/dL (ref 70–99)
Glucose-Capillary: <10 mg/dL — CL (ref 70–99)

## 2021-09-10 LAB — COMPREHENSIVE METABOLIC PANEL
ALT: 11 U/L (ref 0–44)
AST: 18 U/L (ref 15–41)
Albumin: 2.8 g/dL — ABNORMAL LOW (ref 3.5–5.0)
Alkaline Phosphatase: 79 U/L (ref 38–126)
Anion gap: 16 — ABNORMAL HIGH (ref 5–15)
BUN: 49 mg/dL — ABNORMAL HIGH (ref 6–20)
CO2: 24 mmol/L (ref 22–32)
Calcium: 8.4 mg/dL — ABNORMAL LOW (ref 8.9–10.3)
Chloride: 95 mmol/L — ABNORMAL LOW (ref 98–111)
Creatinine, Ser: 10.19 mg/dL — ABNORMAL HIGH (ref 0.44–1.00)
GFR, Estimated: 4 mL/min — ABNORMAL LOW (ref >60)
Glucose, Bld: 42 mg/dL — CL (ref 70–99)
Potassium: 3.4 mmol/L — ABNORMAL LOW (ref 3.5–5.1)
Sodium: 135 mmol/L (ref 135–145)
Total Bilirubin: 0.7 mg/dL (ref 0.3–1.2)
Total Protein: 7.8 g/dL (ref 6.5–8.1)

## 2021-09-10 LAB — TYPE AND SCREEN
ABO/RH(D): O POS
Antibody Screen: NEGATIVE

## 2021-09-10 LAB — HEPATITIS C ANTIBODY: HCV Ab: NONREACTIVE

## 2021-09-10 LAB — PROTIME-INR
INR: 1.3 — ABNORMAL HIGH (ref 0.8–1.2)
Prothrombin Time: 16.1 seconds — ABNORMAL HIGH (ref 11.4–15.2)

## 2021-09-10 LAB — HEPATITIS B CORE ANTIBODY, TOTAL: Hep B Core Total Ab: NONREACTIVE

## 2021-09-10 LAB — HEPATITIS B SURFACE ANTIBODY,QUALITATIVE: Hep B S Ab: NONREACTIVE

## 2021-09-10 LAB — TROPONIN I (HIGH SENSITIVITY)
Troponin I (High Sensitivity): 33 ng/L — ABNORMAL HIGH (ref <18)
Troponin I (High Sensitivity): 57 ng/L — ABNORMAL HIGH (ref <18)

## 2021-09-10 LAB — MRSA NEXT GEN BY PCR, NASAL: MRSA by PCR Next Gen: NOT DETECTED

## 2021-09-10 LAB — HEPATITIS B SURFACE ANTIGEN: Hepatitis B Surface Ag: NONREACTIVE

## 2021-09-10 LAB — LIPASE, BLOOD: Lipase: 37 U/L (ref 11–51)

## 2021-09-10 LAB — HEPARIN LEVEL (UNFRACTIONATED): Heparin Unfractionated: <0.1 IU/mL — ABNORMAL LOW (ref 0.30–0.70)

## 2021-09-10 MED ORDER — GLUCAGON HCL RDNA (DIAGNOSTIC) 1 MG IJ SOLR
1.0000 mg | Freq: Once | INTRAMUSCULAR | Status: AC
  Administered 2021-09-10: 1 mg via INTRAMUSCULAR
  Filled 2021-09-10: qty 1

## 2021-09-10 MED ORDER — LIDOCAINE HCL 1 % IJ SOLN
INTRAMUSCULAR | Status: DC | PRN
  Administered 2021-09-10: 15 mL

## 2021-09-10 MED ORDER — HEPARIN SODIUM (PORCINE) 1000 UNIT/ML DIALYSIS
1000.0000 [IU] | INTRAMUSCULAR | Status: DC | PRN
  Filled 2021-09-10 (×2): qty 1

## 2021-09-10 MED ORDER — CALCIUM CARBONATE ANTACID 500 MG PO CHEW
1500.0000 mg | CHEWABLE_TABLET | Freq: Three times a day (TID) | ORAL | Status: DC
  Administered 2021-09-10 – 2021-09-12 (×7): 1500 mg via ORAL
  Administered 2021-09-14: 1000 mg via ORAL
  Administered 2021-09-16 – 2021-09-22 (×5): 1500 mg via ORAL
  Administered 2021-09-28: 1000 mg via ORAL
  Filled 2021-09-10: qty 8
  Filled 2021-09-10: qty 3
  Filled 2021-09-10 (×4): qty 8
  Filled 2021-09-10 (×3): qty 3
  Filled 2021-09-10 (×10): qty 8
  Filled 2021-09-10: qty 3

## 2021-09-10 MED ORDER — GLUCOSE 4 G PO CHEW
CHEWABLE_TABLET | ORAL | Status: AC
  Administered 2021-09-10: 4 g via ORAL
  Filled 2021-09-10: qty 1

## 2021-09-10 MED ORDER — LIDOCAINE-PRILOCAINE 2.5-2.5 % EX CREA: 1.0000 "application " | TOPICAL_CREAM | CUTANEOUS | Status: DC | PRN

## 2021-09-10 MED ORDER — CALCITRIOL 0.5 MCG PO CAPS
1.0000 ug | ORAL_CAPSULE | ORAL | Status: DC
  Administered 2021-09-14 – 2021-10-19 (×16): 1 ug via ORAL
  Filled 2021-09-10 (×22): qty 2

## 2021-09-10 MED ORDER — DEXTROSE 50 % IV SOLN
1.0000 | Freq: Once | INTRAVENOUS | Status: AC
  Administered 2021-09-10: 50 mL via INTRAVENOUS
  Filled 2021-09-10: qty 50

## 2021-09-10 MED ORDER — HEPARIN BOLUS VIA INFUSION
7000.0000 [IU] | Freq: Once | INTRAVENOUS | Status: AC
Start: 2021-09-10 — End: 2021-09-10
  Administered 2021-09-10: 7000 [IU] via INTRAVENOUS
  Filled 2021-09-10: qty 7000

## 2021-09-10 MED ORDER — FENTANYL CITRATE (PF) 100 MCG/2ML IJ SOLN
25.0000 ug | INTRAMUSCULAR | Status: DC | PRN
  Administered 2021-09-11 – 2021-09-12 (×3): 25 ug via INTRAVENOUS
  Filled 2021-09-10 (×2): qty 2

## 2021-09-10 MED ORDER — SODIUM CHLORIDE 0.9 % IV SOLN
2.0000 g | Freq: Once | INTRAVENOUS | Status: AC
  Administered 2021-09-10: 2 g via INTRAVENOUS
  Filled 2021-09-10: qty 12.5

## 2021-09-10 MED ORDER — FENTANYL CITRATE PF 50 MCG/ML IJ SOSY
50.0000 ug | PREFILLED_SYRINGE | Freq: Once | INTRAMUSCULAR | Status: AC
  Administered 2021-09-10: 50 ug via INTRAVENOUS
  Filled 2021-09-10: qty 1

## 2021-09-10 MED ORDER — PHENYLEPHRINE 80 MCG/ML (10ML) SYRINGE FOR IV PUSH (FOR BLOOD PRESSURE SUPPORT)
PREFILLED_SYRINGE | INTRAVENOUS | Status: AC
  Filled 2021-09-10: qty 10

## 2021-09-10 MED ORDER — DEXTROSE 10 % IV SOLN: INTRAVENOUS | Status: DC

## 2021-09-10 MED ORDER — IOHEXOL 300 MG/ML  SOLN
100.0000 mL | Freq: Once | INTRAMUSCULAR | Status: AC | PRN
Start: 2021-09-10 — End: 2021-09-27
  Administered 2021-09-27: 100 mL via INTRAVENOUS

## 2021-09-10 MED ORDER — ATORVASTATIN CALCIUM 10 MG PO TABS
10.0000 mg | ORAL_TABLET | Freq: Every day | ORAL | Status: DC
Start: 2021-09-10 — End: 2021-10-21
  Administered 2021-09-10 – 2021-10-20 (×38): 10 mg via ORAL
  Filled 2021-09-10 (×38): qty 1

## 2021-09-10 MED ORDER — PHENYLEPHRINE HCL-NACL 20-0.9 MG/250ML-% IV SOLN
25.0000 ug/min | INTRAVENOUS | Status: DC
  Administered 2021-09-10: 25 ug/min via INTRAVENOUS
  Administered 2021-09-11: 120 ug/min via INTRAVENOUS
  Administered 2021-09-11: 110 ug/min via INTRAVENOUS
  Administered 2021-09-11: 120 ug/min via INTRAVENOUS
  Filled 2021-09-10 (×4): qty 250

## 2021-09-10 MED ORDER — SODIUM CHLORIDE 0.9 % IV SOLN: 100.0000 mL | INTRAVENOUS | Status: DC | PRN

## 2021-09-10 MED ORDER — IOHEXOL 300 MG/ML  SOLN
100.0000 mL | Freq: Once | INTRAMUSCULAR | Status: AC | PRN
  Administered 2021-09-10: 100 mL via INTRAVENOUS

## 2021-09-10 MED ORDER — GLUCOSE 4 G PO CHEW
1.0000 | CHEWABLE_TABLET | ORAL | Status: AC
Start: 2021-09-10 — End: 2021-09-10
  Administered 2021-09-10: 4 g via ORAL
  Filled 2021-09-10: qty 1

## 2021-09-10 MED ORDER — GLUCOSE 4 G PO CHEW: 1.0000 | CHEWABLE_TABLET | ORAL | Status: AC | PRN

## 2021-09-10 MED ORDER — LIDOCAINE HCL 1 % IJ SOLN
INTRAMUSCULAR | Status: AC
  Filled 2021-09-10: qty 20

## 2021-09-10 MED ORDER — CINACALCET HCL 30 MG PO TABS
30.0000 mg | ORAL_TABLET | ORAL | Status: DC
  Administered 2021-09-17 – 2021-10-21 (×14): 30 mg via ORAL
  Filled 2021-09-10 (×25): qty 1

## 2021-09-10 MED ORDER — FENTANYL CITRATE PF 50 MCG/ML IJ SOSY: 25.0000 ug | PREFILLED_SYRINGE | INTRAMUSCULAR | Status: DC | PRN

## 2021-09-10 MED ORDER — PANTOPRAZOLE SODIUM 40 MG PO TBEC
40.0000 mg | DELAYED_RELEASE_TABLET | Freq: Every day | ORAL | Status: DC
  Administered 2021-09-11 – 2021-09-14 (×4): 40 mg via ORAL
  Filled 2021-09-10 (×4): qty 1

## 2021-09-10 MED ORDER — FENTANYL CITRATE PF 50 MCG/ML IJ SOSY: 12.5000 ug | PREFILLED_SYRINGE | INTRAMUSCULAR | Status: DC | PRN

## 2021-09-10 MED ORDER — LACTULOSE 10 GM/15ML PO SOLN
30.0000 g | Freq: Three times a day (TID) | ORAL | Status: DC
  Administered 2021-09-10 – 2021-09-28 (×9): 30 g via ORAL
  Filled 2021-09-10 (×29): qty 45

## 2021-09-10 MED ORDER — DEXTROSE 50 % IV SOLN: 50.0000 mL | Freq: Once | INTRAVENOUS | Status: AC

## 2021-09-10 MED ORDER — ACETAMINOPHEN 325 MG PO TABS
650.0000 mg | ORAL_TABLET | Freq: Four times a day (QID) | ORAL | Status: DC | PRN
  Administered 2021-09-18 – 2021-10-21 (×10): 650 mg via ORAL
  Filled 2021-09-10 (×10): qty 2

## 2021-09-10 MED ORDER — VANCOMYCIN HCL IN DEXTROSE 1-5 GM/200ML-% IV SOLN
1000.0000 mg | INTRAVENOUS | Status: DC
  Administered 2021-09-14: 1000 mg via INTRAVENOUS
  Filled 2021-09-10 (×4): qty 200

## 2021-09-10 MED ORDER — SODIUM CHLORIDE 0.9% FLUSH
10.0000 mL | Freq: Two times a day (BID) | INTRAVENOUS | Status: DC
  Administered 2021-09-11: 10 mL
  Administered 2021-09-11: 20 mL
  Administered 2021-09-11 – 2021-09-13 (×4): 10 mL
  Administered 2021-09-13: 40 mL
  Administered 2021-09-14 – 2021-09-16 (×5): 10 mL
  Administered 2021-09-16: 20 mL
  Administered 2021-09-17 – 2021-09-21 (×9): 10 mL
  Administered 2021-09-22: 30 mL
  Administered 2021-09-22 – 2021-10-11 (×30): 10 mL
  Administered 2021-10-11: 30 mL
  Administered 2021-10-12 – 2021-10-13 (×4): 10 mL

## 2021-09-10 MED ORDER — MIDODRINE HCL 5 MG PO TABS
10.0000 mg | ORAL_TABLET | Freq: Three times a day (TID) | ORAL | Status: DC
  Administered 2021-09-10 – 2021-09-13 (×8): 10 mg via ORAL
  Filled 2021-09-10 (×11): qty 2

## 2021-09-10 MED ORDER — GABAPENTIN 100 MG PO CAPS: 200.0000 mg | ORAL_CAPSULE | ORAL | Status: DC

## 2021-09-10 MED ORDER — GLUCAGON HCL RDNA (DIAGNOSTIC) 1 MG IJ SOLR
1.0000 mg | Freq: Once | INTRAMUSCULAR | Status: DC | PRN
Start: 2021-09-10 — End: 2021-10-21

## 2021-09-10 MED ORDER — ONDANSETRON HCL 4 MG/2ML IJ SOLN
4.0000 mg | Freq: Four times a day (QID) | INTRAMUSCULAR | Status: DC | PRN
  Administered 2021-09-11 – 2021-10-11 (×9): 4 mg via INTRAVENOUS
  Filled 2021-09-10 (×8): qty 2

## 2021-09-10 MED ORDER — NITROGLYCERIN 0.4 MG SL SUBL: 0.4000 mg | SUBLINGUAL_TABLET | SUBLINGUAL | Status: DC | PRN

## 2021-09-10 MED ORDER — SENNOSIDES-DOCUSATE SODIUM 8.6-50 MG PO TABS
1.0000 | ORAL_TABLET | Freq: Two times a day (BID) | ORAL | Status: DC
  Administered 2021-09-10 – 2021-10-02 (×28): 1 via ORAL
  Filled 2021-09-10 (×32): qty 1

## 2021-09-10 MED ORDER — GLUCAGON HCL RDNA (DIAGNOSTIC) 1 MG IJ SOLR
INTRAMUSCULAR | Status: AC
  Administered 2021-09-10: 1 mg via INTRAMUSCULAR
  Filled 2021-09-10: qty 1

## 2021-09-10 MED ORDER — ACETAMINOPHEN 650 MG RE SUPP: 650.0000 mg | Freq: Four times a day (QID) | RECTAL | Status: DC | PRN

## 2021-09-10 MED ORDER — SODIUM CHLORIDE 0.9% FLUSH
3.0000 mL | Freq: Two times a day (BID) | INTRAVENOUS | Status: DC
  Administered 2021-09-10 – 2021-09-20 (×18): 3 mL via INTRAVENOUS
  Administered 2021-09-21: 10 mL via INTRAVENOUS
  Administered 2021-09-21 – 2021-10-21 (×40): 3 mL via INTRAVENOUS

## 2021-09-10 MED ORDER — POLYETHYLENE GLYCOL 3350 17 G PO PACK: 17.0000 g | PACK | Freq: Every day | ORAL | Status: DC | PRN

## 2021-09-10 MED ORDER — ONDANSETRON 4 MG PO TBDP
4.0000 mg | ORAL_TABLET | Freq: Once | ORAL | Status: AC
  Administered 2021-09-10: 4 mg via ORAL
  Filled 2021-09-10: qty 1

## 2021-09-10 MED ORDER — VANCOMYCIN HCL 2000 MG/400ML IV SOLN
2000.0000 mg | Freq: Once | INTRAVENOUS | Status: AC
  Administered 2021-09-10: 2000 mg via INTRAVENOUS
  Filled 2021-09-10: qty 400

## 2021-09-10 MED ORDER — DARBEPOETIN ALFA 200 MCG/0.4ML IJ SOSY
200.0000 ug | PREFILLED_SYRINGE | INTRAMUSCULAR | Status: DC
Start: 2021-09-12 — End: 2021-10-04
  Administered 2021-09-19 – 2021-09-26 (×2): 200 ug via INTRAVENOUS
  Filled 2021-09-10 (×7): qty 0.4

## 2021-09-10 MED ORDER — LIDOCAINE HCL (PF) 1 % IJ SOLN: 5.0000 mL | INTRAMUSCULAR | Status: DC | PRN

## 2021-09-10 MED ORDER — SODIUM CHLORIDE 0.9 % IV SOLN
2.0000 g | INTRAVENOUS | Status: DC
  Administered 2021-09-13 – 2021-10-19 (×17): 2 g via INTRAVENOUS
  Filled 2021-09-10 (×21): qty 12.5

## 2021-09-10 MED ORDER — ONDANSETRON HCL 4 MG PO TABS: 4.0000 mg | ORAL_TABLET | Freq: Four times a day (QID) | ORAL | Status: DC | PRN

## 2021-09-10 MED ORDER — DEXTROSE 50 % IV SOLN
INTRAVENOUS | Status: AC
  Filled 2021-09-10: qty 50

## 2021-09-10 MED ORDER — SODIUM CHLORIDE 0.9 % IV SOLN
250.0000 mL | INTRAVENOUS | Status: DC
  Administered 2021-09-12 – 2021-09-26 (×3): 250 mL via INTRAVENOUS

## 2021-09-10 MED ORDER — GLUCOSE 4 G PO CHEW
1.0000 | CHEWABLE_TABLET | Freq: Once | ORAL | Status: AC
  Administered 2021-09-10: 1 via ORAL

## 2021-09-10 MED ORDER — PENTAFLUOROPROP-TETRAFLUOROETH EX AERO: 1.0000 "application " | INHALATION_SPRAY | CUTANEOUS | Status: DC | PRN

## 2021-09-10 MED ORDER — LABETALOL HCL 5 MG/ML IV SOLN: 20.0000 mg | INTRAVENOUS | Status: DC | PRN

## 2021-09-10 MED ORDER — ALTEPLASE 2 MG IJ SOLR
2.0000 mg | Freq: Once | INTRAMUSCULAR | Status: DC | PRN
Start: 2021-09-10 — End: 2021-09-16

## 2021-09-10 MED ORDER — DEXTROSE 50 % IV SOLN
INTRAVENOUS | Status: AC
  Administered 2021-09-10: 50 mL via INTRAVENOUS
  Filled 2021-09-10: qty 50

## 2021-09-10 MED ORDER — GLUCOSE 4 G PO CHEW
1.0000 | CHEWABLE_TABLET | ORAL | Status: DC | PRN
Start: 2021-09-10 — End: 2021-10-21

## 2021-09-10 MED ORDER — GABAPENTIN 100 MG PO CAPS: 100.0000 mg | ORAL_CAPSULE | ORAL | Status: DC

## 2021-09-10 MED ORDER — DEXTROSE 50 % IV SOLN
25.0000 mL | Freq: Once | INTRAVENOUS | Status: AC
  Administered 2021-09-10: 25 mL via INTRAVENOUS
  Filled 2021-09-10: qty 50

## 2021-09-10 MED ORDER — POLYETHYLENE GLYCOL 3350 17 G PO PACK
17.0000 g | PACK | Freq: Every day | ORAL | Status: DC
  Administered 2021-09-10 – 2021-09-18 (×3): 17 g via ORAL
  Filled 2021-09-10 (×9): qty 1

## 2021-09-10 MED ORDER — OCTREOTIDE ACETATE 50 MCG/ML IJ SOLN
50.0000 ug | Freq: Once | INTRAMUSCULAR | Status: AC
Start: 2021-09-10 — End: 2021-09-10
  Administered 2021-09-10: 50 ug via SUBCUTANEOUS
  Filled 2021-09-10: qty 1

## 2021-09-10 MED ORDER — GABAPENTIN 100 MG PO CAPS
100.0000 mg | ORAL_CAPSULE | Freq: Two times a day (BID) | ORAL | Status: DC
  Administered 2021-09-10 – 2021-09-12 (×5): 100 mg via ORAL
  Filled 2021-09-10 (×5): qty 1

## 2021-09-10 MED ORDER — CHLORHEXIDINE GLUCONATE CLOTH 2 % EX PADS
6.0000 | MEDICATED_PAD | Freq: Every day | CUTANEOUS | Status: DC
Start: 2021-09-10 — End: 2021-09-13
  Administered 2021-09-11: 6 via TOPICAL

## 2021-09-10 MED ORDER — GLUCOSE 40 % PO GEL
ORAL | Status: AC
  Filled 2021-09-10: qty 1

## 2021-09-10 MED ORDER — HEPARIN (PORCINE) 25000 UT/250ML-% IV SOLN
2900.0000 [IU]/h | INTRAVENOUS | Status: DC
  Administered 2021-09-10: 1600 [IU]/h via INTRAVENOUS
  Administered 2021-09-12: 2300 [IU]/h via INTRAVENOUS
  Administered 2021-09-12: 2000 [IU]/h via INTRAVENOUS
  Administered 2021-09-13 (×2): 2800 [IU]/h via INTRAVENOUS
  Administered 2021-09-13: 2500 [IU]/h via INTRAVENOUS
  Administered 2021-09-14 – 2021-09-17 (×6): 2800 [IU]/h via INTRAVENOUS
  Administered 2021-09-17: 2900 [IU]/h via INTRAVENOUS
  Administered 2021-09-17: 2800 [IU]/h via INTRAVENOUS
  Administered 2021-09-18: 2900 [IU]/h via INTRAVENOUS
  Filled 2021-09-10 (×17): qty 250

## 2021-09-10 MED ORDER — CALCIUM CARBONATE ANTACID 500 MG PO CHEW: 1500.0000 mg | CHEWABLE_TABLET | ORAL | Status: DC | PRN

## 2021-09-10 MED ORDER — FENTANYL CITRATE (PF) 100 MCG/2ML IJ SOLN: 12.5000 ug | INTRAMUSCULAR | Status: DC | PRN

## 2021-09-10 NOTE — Assessment & Plan Note (Signed)
   Substantial lactic acidosis noted on initial  Possibly secondary to splenic infarction and other areas of poor perfusion or possibly secondary to underlying infectious process  Unable to provide patient with aggressive intravenous form resuscitation due to presence of ESRD  Treating underlying infection with broad-spectrum antibiotics  Performing serial lactic acid levels to ensure downtrending and resolution  Getting vascular surgery involved as noted above, their input is appreciated

## 2021-09-10 NOTE — Assessment & Plan Note (Signed)
.   Continuing home regimen of lipid lowering therapy.  

## 2021-09-10 NOTE — Progress Notes (Signed)
Pharmacy Antibiotic Note  Courtney Gray is a 51 y.o. female admitted on 09/09/2021 with sepsis.  Pharmacy has been consulted for Vancomycin dosing.  Plan: Vancomycin 2000 mg IV x 1, then 1000 mg IV qHD TTS Trend WBC, temp, HD schedule F/U infectious work-up Drug levels as indicated   Temp (24hrs), Avg:100.7 F (38.2 C), Min:99.7 F (37.6 C), Max:101.6 F (38.7 C)  Recent Labs  Lab 09/03/21 1840 09/03/21 2040 09/10/21 0031 09/10/21 0040  WBC 13.3*  --  14.2*  --   CREATININE  --  7.16* 10.19* 11.20*    Estimated Creatinine Clearance: 8.9 mL/min (A) (by C-G formula based on SCr of 11.2 mg/dL (H)).    Allergies  Allergen Reactions   Benzonatate Other (See Comments)    Hallucinations   Hydralazine Other (See Comments)    Hallucinations   Amoxicillin Nausea And Vomiting   Clonidine Other (See Comments)    Altered mental state   Chlorhexidine Itching   Morphine Itching   Oxycodone Itching    Pt tolerated hospital admission 07/2021    Narda Bonds, PharmD, [place] Pharmacist Phone: [phone]

## 2021-09-10 NOTE — Assessment & Plan Note (Addendum)
   Areas of splenic infarction noted on CT imaging of the abdomen pelvis as the cause of the patient's severe abdominal pain  This is particularly concerning in the setting of multiple other areas of sudden vascular compromise including a necrotic looking lesion of the third digit of the left hand hand, recent right lower extremity AKA due to acute limb ischemia as well as several dusky cold toes noted of the left foot with a pulse that cannot be palpated.  Concern for central embolic process.  Possible intracardiac thrombus or even infective endocarditis considering the patient's fever.    Ordering a transthoracic echocardiogram now which can be done today, additionally have discussed case with cardiology further assistance and have scheduled TEE for Friday.    Heparin infusion will be initiated for anticoagulation  Broad-spectrum intravenous antibiotics including vancomycin will be initiated in case of infective endocarditis  As needed low-dose opiate based analgesics for substantial associated pain  I have additionally asked vascular surgery to consult on this patient to provide input on overall management, their input is appreciated.

## 2021-09-10 NOTE — Assessment & Plan Note (Signed)
·   Slightly elevated serial troponins with flat trajectory of elevation °· Patient is chest pain-free °· Likely secondary to underlying illness, plaque rupture is unlikely °· Monitoring patient on telemetry ° ° ° ° ° ° °

## 2021-09-10 NOTE — ED Notes (Signed)
Checked patient blood sugar it was 124 notified RN of blood sugar patient is resting with call bell in reach

## 2021-09-10 NOTE — Assessment & Plan Note (Signed)
   Patient exhibiting recurrent bouts of profound hypoglycemia upon arrival to the emergency department  Likely secondary to poor p.o. intake in the setting of ongoing long-acting insulin use and oral hypoglycemics  Oral hypoglycemics and insulin have been held  Performing Accu-Cheks every 2 hours for now  As needed D50 being administered in addition to D10 infusion  We will space out Accu-Cheks once patient maintains stable blood sugars

## 2021-09-10 NOTE — Assessment & Plan Note (Addendum)
   Considering patient's fever and leukocytosis additional potential source of infection is a developing left lower lobe infiltrate seen on CT imaging of the abdomen pelvis  Providing patient with intravenous cefepime in addition to intravenous vancomycin to empirically cover for potential pneumonia  Blood cultures have been ordered and unfortunately staff had had quite a difficult time successfully drawing these from the patient.  I have asked staff to continue to try until we are able to obtain a sample  Supplemental oxygen to be titrated for bouts of hypoxia

## 2021-09-10 NOTE — ED Notes (Signed)
Patient transported to CT scan . 

## 2021-09-10 NOTE — Consult Note (Addendum)
[facility] [place] CONSULT NOTE   MRN : [id number]  Reason for Consult: Incisional check right AKA,  Referring Physician:   History of Present Illness: 51 y/o female known to our service with recent history of ischemic wounds on the right LE requiring AKA by Dr. Carlis Abbott on 08/14/21.  She has know tibial disease on the left LE with anterior tibial artery is patent for 4 cm prior to occlusion.  The tibioperoneal trunk is patent with single-vessel runoff being the peroneal artery.  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  Peroneal artery provides runoff to the foot via collaterals per angiogram report performed on 08/07/21.    Her Right AKA incision is healing well and she denies drainage, fever or chills.  The left foot has no open wounds, toes are cool to touch, but motor is intact.  She denies rest pain.  She is not ambulatory.     She was brought to [facility] ED by EMS with a chief complaint of abdominal pain.  Past medical history includes: end-stage renal disease (Tues Thurs, Saturday) , chronic diastolic congestive heart failure (Echo 05/2021 ef 55-60% with G2DD), coronary artery disease, insulin-dependent diabetes mellitus type 2 (Hgb A1C 07/09/2021 5.7%), chronic hypoxic respiratory failure (on 2LPM via [place] at home), hyperlipidemia, hypertension,and gout.     She is medically managed on ASA and daily Statin.  Current Facility-Administered Medications  Medication Dose Route Frequency Provider Last Rate Last Admin   acetaminophen (TYLENOL) tablet 650 mg  650 mg Oral Q6H PRN Shalhoub, Sherryll Burger, MD       Or   acetaminophen (TYLENOL) suppository 650 mg  650 mg Rectal Q6H PRN Shalhoub, Sherryll Burger, MD       alteplase (CATHFLO ACTIVASE) injection 2 mg  2 mg Intracatheter Once PRN Shalhoub, Sherryll Burger, MD       atorvastatin (LIPITOR) tablet 10 mg  10 mg Oral QHS Shalhoub, Sherryll Burger, MD        calcium carbonate (TUMS - dosed in mg elemental calcium) chewable tablet 1,500 mg  1,500 mg Oral TID with meals Shalhoub, Sherryll Burger, MD       calcium carbonate (TUMS - dosed in mg elemental calcium) chewable tablet 1,500 mg  1,500 mg Oral PRN Shalhoub, Sherryll Burger, MD       dextrose 10 % infusion   Intravenous Continuous Shalhoub, Sherryll Burger, MD       fentaNYL (SUBLIMAZE) injection 12.5 mcg  12.5 mcg Intravenous Q2H PRN Shalhoub, Sherryll Burger, MD       Or   fentaNYL (SUBLIMAZE) injection 25 mcg  25 mcg Intravenous Q2H PRN Shalhoub, Sherryll Burger, MD       gabapentin (NEURONTIN) capsule 100 mg  100 mg Oral BID Jardin, Carla G, RPH       gabapentin (NEURONTIN) capsule 200 mg  200 mg Oral Q T,Th,Sa-HD Jardin, Carla G, RPH       heparin ADULT infusion 100 units/mL (25000 units/224mL)  1,600 Units/hr Intravenous Continuous Jardin, Carla G, RPH       heparin bolus via infusion 7,000 Units  7,000 Units Intravenous Once Jardin, Carla G, RPH       heparin injection 1,000 Units  1,000 Units Dialysis PRN Shalhoub, Sherryll Burger, MD       labetalol (NORMODYNE) injection 20 mg  20 mg Intravenous Q2H PRN Shalhoub, Sherryll Burger, MD  lidocaine (PF) (XYLOCAINE) 1 % injection 5 mL  5 mL Intradermal PRN Shalhoub, Sherryll Burger, MD       lidocaine-prilocaine (EMLA) cream 1 application.  1 application. Topical PRN Shalhoub, Sherryll Burger, MD       nitroGLYCERIN (NITROSTAT) SL tablet 0.4 mg  0.4 mg Sublingual Q5 min PRN Shalhoub, Sherryll Burger, MD       ondansetron [facility]) tablet 4 mg  4 mg Oral Q6H PRN Shalhoub, Sherryll Burger, MD       Or   ondansetron [facility]) injection 4 mg  4 mg Intravenous Q6H PRN Shalhoub, Sherryll Burger, MD       pantoprazole (PROTONIX) EC tablet 40 mg  40 mg Oral Daily Shalhoub, Sherryll Burger, MD       pentafluoroprop-tetrafluoroeth (GEBAUERS) aerosol 1 application.  1 application. Topical PRN Shalhoub, Sherryll Burger, MD       polyethylene glycol (MIRALAX / GLYCOLAX) packet 17 g  17 g Oral Daily PRN Shalhoub, Sherryll Burger, MD       sodium  chloride flush (NS) 0.9 % injection 3 mL  3 mL Intravenous Q12H Shalhoub, Sherryll Burger, MD       vancomycin (VANCOCIN) IVPB 1000 mg/200 mL premix  1,000 mg Intravenous Q T,Th,Sa-HD Shalhoub, Sherryll Burger, MD       Current Outpatient Medications  Medication Sig Dispense Refill   acetaminophen (TYLENOL) 500 MG tablet Take 1,000 mg by mouth as needed for moderate pain.     amLODipine (NORVASC) 10 MG tablet Take 1 tablet (10 mg total) by mouth daily. (Patient taking differently: Take 10 mg by mouth daily as needed (Blood pressusre).) 30 tablet 5   aspirin EC 81 MG tablet Take 1 tablet (81 mg total) by mouth daily with breakfast. 30 tablet 11   atorvastatin (LIPITOR) 10 MG tablet Take 1 tablet (10 mg total) by mouth daily. (Patient taking differently: Take 10 mg by mouth at bedtime.) 30 tablet 5   calcium carbonate (TUMS SMOOTHIES) 750 MG chewable tablet Chew 1,500 mg by mouth See admin instructions. Take 1500 mg with each meal and 1500 mg with each snack     carvedilol (COREG) 6.25 MG tablet Take 1 tablet (6.25 mg total) by mouth 2 (two) times daily with a meal. (Patient taking differently: Take 6.25 mg by mouth 2 (two) times daily as needed (if BP > 150).) 60 tablet 0   diphenhydrAMINE (BENADRYL) 25 MG tablet Take 25 mg by mouth 2 (two) times daily as needed for allergies.     docusate sodium (COLACE) 100 MG capsule Take 100 mg by mouth 2 (two) times daily.     gabapentin (NEURONTIN) 100 MG capsule Take 1 capsule (100 mg total) by mouth 2 (two) times daily. (Patient taking differently: Take 100 mg by mouth See admin instructions. Take 100 mg by mouth twice a day on non dialysis days. On dialysis day take 100 mg in the morning, 200 mg after dialysis, and then 100 mg at bedtime.) 60 capsule 0   glipiZIDE (GLUCOTROL XL) 2.5 MG 24 hr tablet Take 1 tablet (2.5 mg total) by mouth daily with breakfast. 30 tablet 0   HYDROcodone-acetaminophen (NORCO/VICODIN) 5-325 MG tablet Take 1-2 tablets by mouth every 4 (four) hours  as needed for moderate pain. (Patient taking differently: Take 1 tablet by mouth every 4 (four) hours as needed for moderate pain.) 20 tablet 0   insulin glargine (LANTUS) 100 UNIT/ML Solostar Pen Inject 12 Units into the skin daily.     loperamide (  IMODIUM A-D) 2 MG tablet Take 4 mg by mouth as needed for diarrhea or loose stools.     nitroGLYCERIN (NITROSTAT) 0.4 MG SL tablet Place 1 tablet (0.4 mg total) under the tongue every 5 (five) minutes as needed for chest pain. 10 tablet 3   pantoprazole (PROTONIX) 40 MG tablet Take 1 tablet (40 mg total) by mouth daily. 30 tablet 5   simethicone (MYLICON) 865 MG chewable tablet Chew 125 mg by mouth in the morning and at bedtime.     tiZANidine (ZANAFLEX) 4 MG capsule Take 4 mg by mouth in the morning and at bedtime.     doxycycline (VIBRAMYCIN) 100 MG capsule Take 100 mg by mouth 2 (two) times daily. (Patient not taking: Reported on 09/10/2021)     lisinopril (ZESTRIL) 20 MG tablet Take 1 tablet (20 mg total) by mouth daily. (Patient not taking: Reported on 09/10/2021) 30 tablet 0   traMADol (ULTRAM) 50 MG tablet Take 1 tablet (50 mg total) by mouth every 6 (six) hours as needed for moderate pain. (Patient not taking: Reported on 09/10/2021) 20 tablet 0    Pt meds include: Statin :Yes Betablocker: Yes ASA: Yes Other anticoagulants/antiplatelets: None  Past Medical History:  Diagnosis Date   Arthritis    Diabetes mellitus without complication ([place])    Dialysis complication, subsequent encounter    Gout    High cholesterol    Hypertension    Renal disorder     Past Surgical History:  Procedure Laterality Date   ABDOMINAL AORTOGRAM W/LOWER EXTREMITY N/A 08/07/2021   Procedure: ABDOMINAL AORTOGRAM W/LOWER EXTREMITY;  Surgeon: Broadus John, MD;  Location: [place] [facility];  Service: Cardiovascular;  Laterality: N/A;   AMPUTATION Right 08/14/2021   Procedure: AMPUTATION ABOVE KNEE;  Surgeon: Marty Heck, MD;  Location: [place];  Service:  Vascular;  Laterality: Right;   AV FISTULA PLACEMENT     x2, unsuccessful.    IR FLUORO GUIDE CV LINE LEFT  12/14/2020   IR FLUORO GUIDE CV LINE LEFT  02/20/2021   IR FLUORO GUIDE CV LINE RIGHT  08/28/2020   IR FLUORO GUIDE CV LINE RIGHT  05/13/2021   IR FLUORO GUIDE CV LINE RIGHT  05/24/2021   IR PERC TUN PERIT CATH WO PORT S&I /IMAG  09/06/2020   IR PTA VENOUS EXCEPT DIALYSIS CIRCUIT  02/20/2021   IR PTA VENOUS EXCEPT DIALYSIS CIRCUIT  05/13/2021   IR REMOVAL TUN CV CATH W/O FL  05/24/2021   IR US GUIDE VASC ACCESS LEFT  09/06/2020   IR US GUIDE VASC ACCESS RIGHT  05/24/2021   IR VENOCAVAGRAM SVC  02/20/2021    Social History Social History   Tobacco Use   Smoking status: Never   Smokeless tobacco: Never  Vaping Use   Vaping Use: Never used  Substance Use Topics   Alcohol use: Never   Drug use: Never    Family History Family History  Problem Relation Age of Onset   Diabetes Mother    Diabetes Father     Allergies  Allergen Reactions   Benzonatate Other (See Comments)    Hallucinations   Hydralazine Other (See Comments)    Hallucinations   Amoxicillin Itching and Nausea And Vomiting   Clonidine Other (See Comments)    Altered mental state, insomnia    Chlorhexidine Itching   Morphine Itching   Oxycodone Itching    Pt tolerated hospital admission 07/2021     REVIEW OF SYSTEMS  General: [ ]   Weight loss, [ ]  Fever, [ ]  chills [x] Obese Neurologic: [ ]  Dizziness, [ ]  Blackouts, [ ]  Seizure [ ]  Stroke, [ ]  "Mini stroke", [ ]  Slurred speech, [ ]  Temporary blindness; [ ]  weakness in arms or legs, [ ]  Hoarseness [ ]  Dysphagia Cardiac: [ ]  Chest pain/pressure, [ ]  Shortness of breath at rest [ ]  Shortness of breath with exertion, [ ]  Atrial fibrillation or irregular heartbeat  Vascular: [ ]  Pain in legs with walking, [ ]  Pain in legs at rest, [ ]  Pain in legs at night,  [ ]  Non-healing ulcer, [ ]  Blood clot in vein/DVT,   Pulmonary: [ ]  Home oxygen, [ ]  Productive cough, [ ]   Coughing up blood, [ ]  Asthma,  [ ]  Wheezing [ ]  COPD Musculoskeletal:  [ ]  Arthritis, [ ]  Low back pain, [ ]  Joint pain Hematologic: [ ]  Easy Bruising, [ ]  Anemia; [ ]  Hepatitis Gastrointestinal: [ ]  Blood in stool, [ ]  Gastroesophageal Reflux/heartburn, Urinary: [ ]  chronic Kidney disease, [ ]  on HD - [ ]  MWF or [ ]  TTHS, [ ]  Burning with urination, [ ]  Difficulty urinating Skin: [ ]  Rashes, [ ]  Wounds Psychological: [ ]  Anxiety, [ ]  Depression  Physical Examination Vitals:   09/10/21 0945 09/10/21 0957 09/10/21 1000 09/10/21 1045  BP: (!) 82/49 (!) 82/24 (!) 83/58 (!) 84/54  Pulse:    64  Resp: 18 14 (!) 23 12  Temp:      TempSrc:      SpO2:    100%  Weight:      Height:       Body mass index is 55.25 kg/m.  General:  WDWN in NAD Gait: WC dependent HENT: WNL Eyes: Pupils equal Pulmonary: normal non-labored breathing , without Rales, rhonchi,  wheezing Cardiac: RRR, without  Murmurs, rubs or gallops; No carotid bruits  Skin: no rashes, ulcers noted;  no Gangrene , no cellulitis; no open wounds;   Vascular Exam/Pulses:Doppler AT /DP single on the left LE with Motor and gross sensation intact      Musculoskeletal: no muscle wasting or atrophy; no edema  Neurologic: A&O X 3; Appropriate Affect ;  SENSATION: grossly intact left LE  Speech is fluent/normal   Significant Diagnostic Studies: CBC Lab Results  Component Value Date   WBC 36.0 (H) 09/10/2021   HGB 8.8 (L) 09/10/2021   HCT 30.9 (L) 09/10/2021   MCV 97.2 09/10/2021   PLT 325 09/10/2021    BMET    Component Value Date/Time   NA 134 (L) 09/10/2021 0920   K 3.3 (L) 09/10/2021 0920   CL 97 (L) 09/10/2021 0920   CO2 19 (L) 09/10/2021 0920   GLUCOSE 75 09/10/2021 0920   BUN 54 (H) 09/10/2021 0920   CREATININE 10.70 (H) 09/10/2021 0920   CALCIUM 8.0 (L) 09/10/2021 0920   GFRNONAA 4 (L) 09/10/2021 0920   GFRAA 6 (L) 11/29/2019 0120   Estimated Creatinine Clearance: 9.3 mL/min (A) (by C-G formula  based on SCr of 10.7 mg/dL (H)).  COAG Lab Results  Component Value Date   INR 1.3 (H) 09/10/2021   INR 1.3 (H) 07/10/2021       ASSESSMENT/PLAN:  PAD with recent right AKA for ischemic changes and no re vascularization options.  Known sever tibial disease left LE without ischemic changes such as open wounds.  She denise rest pain and has intact motor on the left LE. The AKA is healing well with intact staples.  If she  develops open wounds on the left LE she will be at high risk of amputation on the left AS well.  She will be seen in our office 09/17/21 for staple removal.  If she is still hospitalized we can take them out then.    No vascular intervention is needed at this time.  Her left LE needs to be protected to prevent open wounds/injury.     Previous angiogram 08/07/21 Findings:  Aortogram: Bilateral renal arteries not appreciated, aortoiliac system widely patent   On the right: Severe calcific disease throughout.  No flow-limiting stenosis appreciated in the common femoral, profunda, superficial femoral, popliteal arteries.  The posterior tibial artery is occluded, peroneal artery is patent to the mid tibia, where it occludes.  There is single-vessel anterior tibial runoff to the ankle with flush occlusion.  The anterior tibial artery has diffuse disease throughout.  Flow into the foot is through collaterals.   The left: Severe calcific disease throughout.  No flow-limiting stenosis appreciated in the common femoral, profunda, superficial femoral, popliteal arteries.  The anterior tibial artery is patent for 4 cm prior to occlusion.  The tibioperoneal trunk is patent with single-vessel runoff being the peroneal artery.  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  Peroneal artery provides runoff to the foot via collaterals.   Roxy Horseman 09/10/2021 10:46 AM   I have  independently interviewed and examined patient and agree with PA assessment and plan above. Well known to our service. Will need hand surgery evaluation of left finger wound with multiphasic flow by recent duplex. CTA chest to rule out embolizing proximal aortic lesion. Staples in R AKA to removed in another week.  Aaliyah Gavel C. Donzetta Matters, MD [facility] [place] Office: [phone] Pager: [phone]

## 2021-09-10 NOTE — Progress Notes (Signed)
IV team consulted for midline. This pt. Is not a candidate for a midline at this time. She is right arm restricted with a CrCl of 9.3. Midlines require clearance to be at least 30. Also, with multiple lumens needed, midline only offers one lumen. IV team is available 24/7 for any other needs.

## 2021-09-10 NOTE — ED Notes (Addendum)
Unable to collect blood specimens ordered despite multiple venipuncture attempts by RN / Phlebotomist , [facility] notified .

## 2021-09-10 NOTE — ED Notes (Signed)
STUCK PATIENT X 3 NO BLOOD RETURN FOR MORNING LABS REPORT TO NURSE BOBBY RN.AND DOCTOR REES MD.

## 2021-09-10 NOTE — Progress Notes (Signed)
[facility] Progress Note Patient Name: Courtney Gray DOB: 17-Oct-1970 MRN: [id number]   Date of Service  09/10/2021  HPI/Events of Note  Patient with low MAP secondary to very low diastolic BP.  eICU Interventions  Peripheral Phenylephrine gtt ordered to augment diastolic BP and MAP.        Kerry Kass Meghin Thivierge 09/10/2021, 9:11 PM

## 2021-09-10 NOTE — Consult Note (Signed)
NAME:  Courtney Gray, MRN:  [id number], DOB:  07-Mar-1971, LOS: 0 ADMISSION DATE:  09/09/2021, CONSULTATION DATE:  6/6 REFERRING MD:  Dr. Pietro Cassis, CHIEF COMPLAINT:  Abdominal Pain    History of Present Illness:  51 y/o F who presented to [facility] ER on 6/5 with reports of abdominal pain, constipation and decreased appetite.    She was admitted from 4/29-5/24 with initial complaints of right foot pain.  She was found to have an ischemic foot with concern for gangrene.  Patient underwent a right AKA.  Additional findings during that hospitalization included an ulceration on the left middle finger and left second toe pain with concern for compromised perfusion (noted single-vessel peroneal runoff and concern for worsening symptoms in left foot).  [facility]VS informed her that she does not have any revascularization options.  X-rays of her hand at that time did not show osteomyelitis.  There were difficulties with disposition at that time that required involvement of TOC for discharge.  She returned to the ER on 5/30 with reports of weakness, fatigue. Wound was clean. She received HD, & work up was negative at that time & she was discharged home.    6/5 with reports of abdominal pain.  The patient reported one week of pain, constipation, nausea and emesis.  She has been on doxycycline for finger infection.  She had fever to 101.6. Her glucose was low and she was treated with IV dextrose.  She also reported LLE toe pain.  Given abdominal pain, CT of the abdomen was obtained which raised question of indistinctness along the margin of the spleen with concern for possible splenic hemorrhage or injury.  After discussion with Radiology, it was recommended to obtain CT with contrast for better images. CTA of the abdomen / pelvis showed hypoenhancing bands in the spleen with a small amount of perisplenic free fluid, suggesting infarcts, trace pleural effusion with atelectasis or infiltrate. Her initial labs showed Na+ 134, K+ 3.3,  Cl 97, CO2 19, glucose 75, BUN 54, Sr Cr 10.70, AG 18, Phos 5.5, albumin 2.5, lactic acid 3.7, WBC 36, Hgb 8.8, and platelets 325. CXR without infiltrate. She had low blood pressure readings with BP assessment on wrist but normal mental status.  Unfortunately, she had very limited IV access.  Nephrology was consulted for HD.  Her baseline is TTS.   PCCM consulted for evaluation of blood pressure and difficult IV access.   Pertinent  Medical History  DM II  HTN HLD  ESRD on HD  Gout   Significant Hospital Events: Including procedures, antibiotic start and stop dates in addition to other pertinent events   6/5 Admit with abdominal pain, work up concerning for splenic infarct  Interim History / Subjective:  Tmax 101.6 Pt reports feeling better after being in ER RN notes limited IV access > had to give oral glucose replacement for hypoglycemia  Objective   Blood pressure 94/68, pulse 66, temperature 99 F (37.2 C), temperature source Oral, resp. rate 14, height 5\' 5"  (1.651 m), weight (!) 150.6 kg, SpO2 98 %.        Intake/Output Summary (Last 24 hours) at 09/10/2021 1110 Last data filed at 09/10/2021 0800 Gross per 24 hour  Intake 400 ml  Output --  Net 400 ml   Filed Weights   09/10/21 0900  Weight: (!) 150.6 kg    Examination: General: chronically ill appearing adult female sitting up on stretcher in NAD HENT: MM pink/moist, [place] O2, anicteric,  Lungs: non-labored at  rest, lungs bilaterally clear  Cardiovascular: s1s2 RRR, no m/r/g Abdomen: protuberant, large panus, bsx4 active  Extremities: warm/dry, R lower extremity amputation with staples intact, small area of drainage Neuro: AAOx4, speech clear, MAE  No active IV access.  Resolved Hospital Problem list      Assessment & Plan:   Fever, R/o Sepsis May be from splenic process, less likely LLL as is more likely atelectasis.  Other differential to consider include surgical site but largely clean without drainage, left  finger wound & constipation (as source of fever).  -follow pan cultures -empiric abx - cefepime/vanco  -follow lactic acid, PCT  -bowel regimen -PRN tylenol   Splenic Infarction ABD Pain  -heparin infusion when IV access available  -follow abd exam  -given recent hx, ? Consider TEE at some point   Hypotension  Hx HTN, HLD  BP being checked on wrist, ? Validity. Normal mental status on exam  -BP soft normal currently -monitor in ICU, may need vasopressors for HD  -continue lipitor  -hold home norvasc, coreg, lisinopril  ESRD  On HD TTS -Nephrology consulted -anticipated HD 6/6, may need vasopressors for HD -Trend BMP / urinary output -Replace electrolytes as indicated -hepatitis antibiodies per Nephro  PVD s/p RLE AKA  AKA in early May per Dr. Carlis Abbott -wound care per VVS -elevate stump, keep clean / dry  Difficult IV Access  Has R IJ temp cath, very small left IJ / appears more narrow on US exam as it nears the clavicle.  Panus too large to place femoral line and compromised flow to LLE prohibits femoral placement.  -IR consulted for urgent IV access if possible, greatly appreciate assistance with patient care  Chronic Hypoxic Respiratory Failure  Suspected OHS -O2 as needed to support sats 90-95% -consider outpatient sleep study  DM II with Hypoglycemia  -hold home agents for now  -allow oral meds / food  -D50w PRN when able to establish IV access  Constipation  -bowel regimen  Left Hand Infection  -hold further doxycycline  -abx as above   Best Practice (right click and "Reselect all SmartList Selections" daily)   Diet/type: Regular consistency (see orders) DVT prophylaxis: systemic heparin GI prophylaxis: PPI Lines: Dialysis Catheter Foley:  N/A Code Status:  full code Last date of multidisciplinary goals of care discussion:  Patient and daughter updated on plan of care.  Labs   CBC: Recent Labs  Lab 09/03/21 1840 09/10/21 0031 09/10/21 0040  09/10/21 0920  WBC 13.3* 14.2*  --  36.0*  NEUTROABS 10.6* 13.1*  --   --   HGB 8.6* 9.5* 11.9* 8.8*  HCT 29.5* 32.3* 35.0* 30.9*  MCV 96.7 95.0  --  97.2  PLT 313 384  --  537    Basic Metabolic Panel: Recent Labs  Lab 09/03/21 2040 09/10/21 0031 09/10/21 0040 09/10/21 0920  NA 131* 135 135 134*  K 3.6 3.4* 3.3* 3.3*  CL 95* 95* 96* 97*  CO2 25 24  --  19*  GLUCOSE 86 42* 39* 75  BUN 34* 49* 48* 54*  CREATININE 7.16* 10.19* 11.20* 10.70*  CALCIUM 8.1* 8.4*  --  8.0*  PHOS  --   --   --  5.5*   GFR: Estimated Creatinine Clearance: 9.3 mL/min (A) (by C-G formula based on SCr of 10.7 mg/dL (H)). Recent Labs  Lab 09/03/21 1840 09/10/21 0031 09/10/21 0530 09/10/21 0920  WBC 13.3* 14.2*  --  36.0*  LATICACIDVEN  --   --  3.7*  2.2*    Liver Function Tests: Recent Labs  Lab 09/03/21 2040 09/10/21 0031 09/10/21 0920  AST 23 18  --   ALT 16 11  --   ALKPHOS 77 79  --   BILITOT 0.4 0.7  --   PROT 7.7 7.8  --   ALBUMIN 3.0* 2.8* 2.5*   Recent Labs  Lab 09/03/21 1840 09/10/21 0031  LIPASE 45 37   No results for input(s): AMMONIA in the last 168 hours.  ABG    Component Value Date/Time   HCO3 21.6 07/11/2021 0036   TCO2 25 09/10/2021 0040   ACIDBASEDEF 4.2 (H) 07/11/2021 0036   O2SAT 100 07/11/2021 0036     Coagulation Profile: Recent Labs  Lab 09/10/21 0403  INR 1.3*    Cardiac Enzymes: No results for input(s): CKTOTAL, CKMB, CKMBINDEX, TROPONINI in the last 168 hours.  HbA1C: Hgb A1c MFr Bld  Date/Time Value Ref Range Status  07/09/2021 02:29 PM 5.7 (H) 4.8 - 5.6 % Final    Comment:    (NOTE)         Prediabetes: 5.7 - 6.4         Diabetes: >6.4         Glycemic control for adults with diabetes: <7.0   04/23/2021 05:12 AM 8.9 (H) 4.8 - 5.6 % Final    Comment:    (NOTE) Pre diabetes:          5.7%-6.4%  Diabetes:              >6.4%  Glycemic control for   <7.0% adults with diabetes     CBG: Recent Labs  Lab 09/10/21 0410  09/10/21 0847 09/10/21 0928 09/10/21 0957 09/10/21 1050  GLUCAP 132* 18* 62* 124* 50*    Review of Systems: Positives in [place]   Gen: Denies fever, chills, weight change, fatigue, night sweats HEENT: Denies blurred vision, double vision, hearing loss, tinnitus, sinus congestion, rhinorrhea, sore throat, neck stiffness, dysphagia PULM: Denies shortness of breath, cough, sputum production, hemoptysis, wheezing CV: Denies chest pain, edema, orthopnea, paroxysmal nocturnal dyspnea, palpitations GI: Denies abdominal pain, nausea, vomiting, diarrhea, hematochezia, melena, constipation, change in bowel habits GU: Denies dysuria, hematuria, polyuria, oliguria, urethral discharge Endocrine: Denies hot or cold intolerance, polyuria, polyphagia or appetite change Derm: Denies rash, dry skin, scaling or peeling skin change Heme: Denies easy bruising, bleeding, bleeding gums Neuro: Denies headache, numbness, weakness, slurred speech, loss of memory or consciousness  Past Medical History:  She,  has a past medical history of Arthritis, Diabetes mellitus without complication ([place]), Dialysis complication, subsequent encounter, Gout, High cholesterol, Hypertension, and Renal disorder.   Surgical History:   Past Surgical History:  Procedure Laterality Date   ABDOMINAL AORTOGRAM W/LOWER EXTREMITY N/A 08/07/2021   Procedure: ABDOMINAL AORTOGRAM W/LOWER EXTREMITY;  Surgeon: Broadus John, MD;  Location: [facility];  Service: Cardiovascular;  Laterality: N/A;   AMPUTATION Right 08/14/2021   Procedure: AMPUTATION ABOVE KNEE;  Surgeon: Marty Heck, MD;  Location: [facility];  Service: Vascular;  Laterality: Right;   AV FISTULA PLACEMENT     x2, unsuccessful.    IR FLUORO GUIDE CV LINE LEFT  12/14/2020   IR FLUORO GUIDE CV LINE LEFT  02/20/2021   IR FLUORO GUIDE CV LINE RIGHT  08/28/2020   IR FLUORO GUIDE CV LINE RIGHT  05/13/2021   IR FLUORO GUIDE CV LINE RIGHT  05/24/2021   IR PERC TUN PERIT CATH  WO PORT S&I Dartha Lodge  09/06/2020  IR PTA VENOUS EXCEPT DIALYSIS CIRCUIT  02/20/2021   IR PTA VENOUS EXCEPT DIALYSIS CIRCUIT  05/13/2021   IR REMOVAL TUN CV CATH W/O FL  05/24/2021   IR US GUIDE VASC ACCESS LEFT  09/06/2020   IR US GUIDE VASC ACCESS RIGHT  05/24/2021   IR VENOCAVAGRAM SVC  02/20/2021     Social History:   reports that she has never smoked. She has never used smokeless tobacco. She reports that she does not drink alcohol and does not use drugs.   Family History:  Her family history includes Diabetes in her father and mother.   Allergies Allergies  Allergen Reactions   Benzonatate Other (See Comments)    Hallucinations   Hydralazine Other (See Comments)    Hallucinations   Amoxicillin Itching and Nausea And Vomiting   Clonidine Other (See Comments)    Altered mental state, insomnia    Chlorhexidine Itching   Morphine Itching   Oxycodone Itching    Pt tolerated hospital admission 07/2021     Home Medications  Prior to Admission medications   Medication Sig Start Date End Date Taking? Authorizing Provider  acetaminophen (TYLENOL) 500 MG tablet Take 1,000 mg by mouth as needed for moderate pain.   Yes [provider]  amLODipine (NORVASC) 10 MG tablet Take 1 tablet (10 mg total) by mouth daily. Patient taking differently: Take 10 mg by mouth daily as needed (Blood pressusre). 11/29/20  Yes Roxan Hockey, MD  aspirin EC 81 MG tablet Take 1 tablet (81 mg total) by mouth daily with breakfast. 11/28/20  Yes Emokpae, Courage, MD  atorvastatin (LIPITOR) 10 MG tablet Take 1 tablet (10 mg total) by mouth daily. Patient taking differently: Take 10 mg by mouth at bedtime. 11/28/20  Yes Emokpae, Courage, MD  calcium carbonate (TUMS SMOOTHIES) 750 MG chewable tablet Chew 1,500 mg by mouth See admin instructions. Take 1500 mg with each meal and 1500 mg with each snack   Yes [provider]  carvedilol (COREG) 6.25 MG tablet Take 1 tablet (6.25 mg total) by mouth 2 (two)  times daily with a meal. Patient taking differently: Take 6.25 mg by mouth 2 (two) times daily as needed (if BP > 150). 08/28/21 09/27/21 Yes Pahwani, Einar Grad, MD  diphenhydrAMINE (BENADRYL) 25 MG tablet Take 25 mg by mouth 2 (two) times daily as needed for allergies.   Yes [provider]  docusate sodium (COLACE) 100 MG capsule Take 100 mg by mouth 2 (two) times daily.   Yes [provider]  gabapentin (NEURONTIN) 100 MG capsule Take 1 capsule (100 mg total) by mouth 2 (two) times daily. Patient taking differently: Take 100 mg by mouth See admin instructions. Take 100 mg by mouth twice a day on non dialysis days. On dialysis day take 100 mg in the morning, 200 mg after dialysis, and then 100 mg at bedtime. 08/28/21 09/27/21 Yes Pahwani, Einar Grad, MD  glipiZIDE (GLUCOTROL XL) 2.5 MG 24 hr tablet Take 1 tablet (2.5 mg total) by mouth daily with breakfast. 08/28/21 09/27/21 Yes Pahwani, Einar Grad, MD  HYDROcodone-acetaminophen (NORCO/VICODIN) 5-325 MG tablet Take 1-2 tablets by mouth every 4 (four) hours as needed for moderate pain. Patient taking differently: Take 1 tablet by mouth every 4 (four) hours as needed for moderate pain. 08/19/21  Yes Pahwani, Einar Grad, MD  insulin glargine (LANTUS) 100 UNIT/ML Solostar Pen Inject 12 Units into the skin daily.   Yes [provider]  loperamide (IMODIUM A-D) 2 MG tablet Take 4 mg by  mouth as needed for diarrhea or loose stools.   Yes [provider]  nitroGLYCERIN (NITROSTAT) 0.4 MG SL tablet Place 1 tablet (0.4 mg total) under the tongue every 5 (five) minutes as needed for chest pain. 05/24/21  Yes [place] Eric J, DO  pantoprazole (PROTONIX) 40 MG tablet Take 1 tablet (40 mg total) by mouth daily. 11/29/20  Yes Roxan Hockey, MD  simethicone (MYLICON) 967 MG chewable tablet Chew 125 mg by mouth in the morning and at bedtime.   Yes [provider]  tiZANidine (ZANAFLEX) 4 MG capsule Take 4 mg by mouth in the morning and at bedtime.    Yes [provider]  doxycycline (VIBRAMYCIN) 100 MG capsule Take 100 mg by mouth 2 (two) times daily. Patient not taking: Reported on 09/10/2021 08/30/21   [provider]  lisinopril (ZESTRIL) 20 MG tablet Take 1 tablet (20 mg total) by mouth daily. Patient not taking: Reported on 09/10/2021 08/28/21 09/27/21  Darliss Cheney, MD  traMADol (ULTRAM) 50 MG tablet Take 1 tablet (50 mg total) by mouth every 6 (six) hours as needed for moderate pain. Patient not taking: Reported on 09/10/2021 08/28/21   Darliss Cheney, MD     Critical care time: 31 minutes     Noe Gens, MSN, APRN, NP-C, AGACNP-BC [facility] 09/10/2021, 1:51 PM   Please see [email or web address] for pager details.   From 7A-7P if no response, please call [phone] After hours, please call ELink [phone]

## 2021-09-10 NOTE — Progress Notes (Signed)
ANTICOAGULATION CONSULT NOTE - Initial Consult  Pharmacy Consult for Heparin infusion Indication:  splenic infarction  Allergies  Allergen Reactions   Benzonatate Other (See Comments)    Hallucinations   Hydralazine Other (See Comments)    Hallucinations   Amoxicillin Itching and Nausea And Vomiting   Clonidine Other (See Comments)    Altered mental state, insomnia    Chlorhexidine Itching   Morphine Itching   Oxycodone Itching    Pt tolerated hospital admission 07/2021    Patient Measurements: Height: 5\' 5"  (165.1 cm) Weight: (!) 150.6 kg (332 lb 0.2 oz) IBW/kg (Calculated) : 57 Heparin Dosing Weight: 95.1 kg  Vital Signs: Temp: 99 F (37.2 C) (06/06 0819) Temp Source: Oral (06/06 0819) BP: 148/63 (06/06 0819) Pulse Rate: 84 (06/06 0819)  Labs: Recent Labs    09/10/21 0031 09/10/21 0040 09/10/21 0340 09/10/21 0403  HGB 9.5* 11.9*  --   --   HCT 32.3* 35.0*  --   --   PLT 384  --   --   --   LABPROT  --   --   --  16.1*  INR  --   --   --  1.3*  CREATININE 10.19* 11.20*  --   --   TROPONINIHS 33*  --  57*  --     Estimated Creatinine Clearance: 8.9 mL/min (A) (by C-G formula based on SCr of 11.2 mg/dL (H)).   Medical History: Past Medical History:  Diagnosis Date   Arthritis    Diabetes mellitus without complication ([place])    Dialysis complication, subsequent encounter    Gout    High cholesterol    Hypertension    Renal disorder     Medications:  (Not in a hospital admission)   Assessment: [name] presented to ED with c/o abdominal pain, constipation, nausea, and vomiting x 1 week. CT abdomen shows splenic infarction. Pharmacy consulted to dose and manage heparin infusion.  Hgb 11.9, Plt 384, INR 1.3 (not on anticoagulation prior to admission)  Goal of Therapy:  Heparin level 0.3-0.7 units/ml Monitor platelets by anticoagulation protocol: Yes   Plan:  Heparin 7000 units x 1, followed by heparin infusion at 1600 units/hr Check heparin level  every 6 hours until in therapeutic range x2 Monitor CBC, heparin level, and for s/sx of bleeding daily  Kaleen Mask 09/10/2021,9:09 AM

## 2021-09-10 NOTE — Assessment & Plan Note (Signed)
   Continue home regimen supplemental oxygen which be titrated upwards for bouts of hypoxia

## 2021-09-10 NOTE — ED Provider Notes (Signed)
[facility] EMERGENCY DEPARTMENT Provider Note   CSN: [id number] Arrival date & time: 09/09/21  2333     History  Chief Complaint  Patient presents with   Nausea   Constipation    Courtney Gray is a 51 y.o. female.  The history is provided by the patient and medical records.  Constipation Courtney Gray is a 51 y.o. female who presents to the Emergency Department complaining of constipation.  She presents to the emergency department complaining of abdominal pain and constipation as well as nausea and vomiting.  Symptoms started 1 week ago with constipation and sensation that she needs to have a bowel movement but unable to get anything out.  She has ESRD and is on hemodialysis Tuesday Thursday Saturday.  She had her session on Saturday and did have a protein shake at that time.  She states that since then she has experienced progressive upper abdominal pain and today has been experiencing nausea with white emesis.  She has shortness of breath due to her abdominal discomfort.  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  No reported fevers at home.  Symptoms are severe, constant, worsening.    Home Medications Prior to Admission medications   Medication Sig Start Date End Date Taking? Authorizing Provider  acetaminophen (TYLENOL) 500 MG tablet Take 1,000 mg by mouth every 6 (six) hours as needed for moderate pain or headache.    [provider]  amLODipine (NORVASC) 10 MG tablet Take 1 tablet (10 mg total) by mouth daily. Patient taking differently: Take 10 mg by mouth daily as needed (Blood pressusre). 11/29/20   Roxan Hockey, MD  aspirin EC 81 MG tablet Take 1 tablet (81 mg total) by mouth daily with breakfast. 11/28/20   Denton Brick, Courage, MD  atorvastatin (LIPITOR) 10 MG tablet Take 1 tablet (10 mg total) by mouth daily. Patient taking differently: Take 10 mg by mouth  at bedtime. 11/28/20   Roxan Hockey, MD  calcium carbonate (TUMS SMOOTHIES) 750 MG chewable tablet Chew 750-1,500 mg by mouth See admin instructions. Take 1500 mg with each meal and 750 mg with each snack    [provider]  carvedilol (COREG) 6.25 MG tablet Take 1 tablet (6.25 mg total) by mouth 2 (two) times daily with a meal. 08/28/21 09/27/21  Darliss Cheney, MD  diphenhydrAMINE (BENADRYL) 25 MG tablet Take 25 mg by mouth daily as needed for allergies or sleep.    [provider]  docusate sodium (COLACE) 100 MG capsule Take 100 mg by mouth 2 (two) times daily.    [provider]  doxycycline (VIBRAMYCIN) 100 MG capsule Take 100 mg by mouth 2 (two) times daily. 08/30/21   [provider]  gabapentin (NEURONTIN) 100 MG capsule Take 1 capsule (100 mg total) by mouth 2 (two) times daily. 08/28/21 09/27/21  Darliss Cheney, MD  glipiZIDE (GLUCOTROL XL) 2.5 MG 24 hr tablet Take 1 tablet (2.5 mg total) by mouth daily with breakfast. 08/28/21 09/27/21  Darliss Cheney, MD  HYDROcodone-acetaminophen (NORCO/VICODIN) 5-325 MG tablet Take 1-2 tablets by mouth every 4 (four) hours as needed for moderate pain. 08/19/21   Darliss Cheney, MD  lisinopril (ZESTRIL) 20 MG tablet Take 1 tablet (20 mg total) by mouth daily. 08/28/21 09/27/21  Darliss Cheney, MD  loperamide (IMODIUM A-D) 2 MG tablet Take 4 mg by mouth as needed for diarrhea or loose stools.    [provider]  nitroGLYCERIN (NITROSTAT) 0.4 MG SL tablet Place 1 tablet (0.4 mg total) under the tongue every 5 (five) minutes as needed for chest pain. 05/24/21   [place] Eric J, DO  pantoprazole (PROTONIX) 40 MG tablet Take 1 tablet (40 mg total) by mouth daily. 11/29/20   Roxan Hockey, MD  simethicone (MYLICON) 162 MG chewable tablet Chew 125 mg by mouth 2 (two) times daily as needed for flatulence.    [provider]  traMADol (ULTRAM) 50 MG tablet Take 1 tablet (50 mg total) by mouth every 6 (six) hours as needed  for moderate pain. 08/28/21   Darliss Cheney, MD      Allergies    Benzonatate, Hydralazine, Amoxicillin, Clonidine, Chlorhexidine, Morphine, and Oxycodone    Review of Systems   Review of Systems  Gastrointestinal:  Positive for constipation.  All other systems reviewed and are negative.  Physical Exam Updated Vital Signs BP (!) 127/113 (BP Location: Left Arm)   Pulse 86   Temp (!) 101.6 F (38.7 C) (Rectal)   Resp 18   SpO2 98%  Physical Exam Vitals and nursing note reviewed.  Constitutional:      General: She is in acute distress.     Appearance: She is well-developed. She is ill-appearing.  HENT:     Head: Normocephalic and atraumatic.  Cardiovascular:     Rate and Rhythm: Regular rhythm. Tachycardia present.     Heart sounds: No murmur heard. Pulmonary:     Effort: Pulmonary effort is normal. No respiratory distress.     Breath sounds: Normal breath sounds.     Comments: Vas-Cath in the right upper chest wall with dressing in place that is clean, dry, intact Abdominal:     Palpations: Abdomen is soft.     Tenderness: There is no guarding or rebound.     Comments: Obese abdomen.  Tenderness to palpation across the entire upper abdomen greatest in the epigastric and left upper quadrant  Musculoskeletal:     Comments: Right lower extremity postsurgical AKA site clean, dry, intact.  There is swelling and ecchymosis to the left second toe with local tenderness to palpation.  Skin:    General: Skin is warm and dry.  Neurological:     Mental Status: She is alert and oriented to person, place, and time.  Psychiatric:        Behavior: Behavior normal.    ED Results / Procedures / Treatments   Labs (all labs ordered are listed, but only abnormal results are displayed) Labs Reviewed  COMPREHENSIVE METABOLIC PANEL - Abnormal; Notable for the following components:      Result Value   Potassium 3.4 (*)    Chloride 95 (*)    Glucose, Bld 42 (*)    BUN 49 (*)     Creatinine, Ser 10.19 (*)    Calcium 8.4 (*)    Albumin 2.8 (*)    GFR, Estimated 4 (*)    Anion gap 16 (*)    All other components within normal limits  CBC WITH DIFFERENTIAL/PLATELET - Abnormal; Notable for the following components:   WBC 14.2 (*)    RBC 3.40 (*)    Hemoglobin 9.5 (*)    HCT 32.3 (*)    MCHC 29.4 (*)    RDW 18.6 (*)    Neutro Abs 13.1 (*)    Lymphs Abs 0.6 (*)    All other components within normal limits  PROTIME-INR - Abnormal; Notable for the following components:  Prothrombin Time 16.1 (*)    INR 1.3 (*)    All other components within normal limits  LACTIC ACID, PLASMA - Abnormal; Notable for the following components:   Lactic Acid, Venous 3.7 (*)    All other components within normal limits  CBG MONITORING, ED - Abnormal; Notable for the following components:   Glucose-Capillary 44 (*)    All other components within normal limits  I-STAT CHEM 8, ED - Abnormal; Notable for the following components:   Potassium 3.3 (*)    Chloride 96 (*)    BUN 48 (*)    Creatinine, Ser 11.20 (*)    Glucose, Bld 39 (*)    Calcium, Ion 0.95 (*)    Hemoglobin 11.9 (*)    HCT 35.0 (*)    All other components within normal limits  CBG MONITORING, ED - Abnormal; Notable for the following components:   Glucose-Capillary 61 (*)    All other components within normal limits  CBG MONITORING, ED - Abnormal; Notable for the following components:   Glucose-Capillary 132 (*)    All other components within normal limits  TROPONIN I (HIGH SENSITIVITY) - Abnormal; Notable for the following components:   Troponin I (High Sensitivity) 33 (*)    All other components within normal limits  TROPONIN I (HIGH SENSITIVITY) - Abnormal; Notable for the following components:   Troponin I (High Sensitivity) 57 (*)    All other components within normal limits  CULTURE, BLOOD (ROUTINE X 2)  CULTURE, BLOOD (ROUTINE X 2)  LIPASE, BLOOD  LACTIC ACID, PLASMA  TYPE AND SCREEN    EKG EKG  Interpretation  Date/Time:  Tuesday September 10 2021 00:18:11 EDT Ventricular Rate:  83 PR Interval:  157 QRS Duration: 120 QT Interval:  337 QTC Calculation: 396 R Axis:   50 Text Interpretation: Sinus rhythm Atrial premature complex Nonspecific intraventricular conduction delay Inferior infarct, old Minimal ST elevation, anterior leads Lateral leads are also involved Confirmed by Quintella Reichert [phone]) on 09/10/2021 12:30:42 AM  Radiology CT Abdomen Pelvis Wo Contrast  Result Date: 09/10/2021 CLINICAL DATA:  Abdominal pain EXAM: CT ABDOMEN AND PELVIS WITHOUT CONTRAST TECHNIQUE: Multidetector CT imaging of the abdomen and pelvis was performed following the standard protocol without IV contrast. RADIATION DOSE REDUCTION: This exam was performed according to the departmental dose-optimization program which includes automated exposure control, adjustment of the mA and/or kV according to patient size and/or use of iterative reconstruction technique. COMPARISON:  05/19/2021 FINDINGS: Lower chest: Mild basilar atelectasis on the left is noted. No sizable effusion is seen. Cardiac enlargement is again noted with dialysis catheter in place. Hepatobiliary: No focal liver abnormality is seen. No gallstones, gallbladder wall thickening, or biliary dilatation. Pancreas: Unremarkable. No pancreatic ductal dilatation or surrounding inflammatory changes. Spleen: Spleen demonstrates decreased attenuation within the substance of the spleen as well as indistinctness along the superior and lateral contour of the spleen. This is of uncertain significance although the possibility of an underlying splenic injury could not be totally excluded. Adrenals/Urinary Tract: Adrenal glands are within normal limits. The kidneys are atrophic consistent with the known end-stage renal disease. Obstructive changes are seen. The bladder is decompressed. Stomach/Bowel: No obstructive or inflammatory changes of the colon are seen. The appendix  is within normal limits. Small bowel and stomach are unremarkable. Vascular/Lymphatic: Diffuse vascular calcifications are seen without acute abnormality. No aneurysmal dilatation is noted. Reproductive: Calcifications are noted within the uterus related to arterial calcifications. No adnexal mass is seen. Other: No abdominal  wall hernia or abnormality. No abdominopelvic ascites. Musculoskeletal: No acute or significant osseous findings. IMPRESSION: Indistinctness along the margin of the spleen as described. This is of uncertain significance although the possibility of a splenic hemorrhage or injury could not be totally excluded. Repeat CT of the abdomen with contrast is recommended for further evaluation. Basilar atelectasis on the left. No findings to suggest bowel obstruction are seen. Changes consistent with end-stage renal disease. Critical Value/emergent results were called by telephone at the time of interpretation on 09/10/2021 at 1:55 am to Dr. Quintella Reichert , who verbally acknowledged these results. Electronically Signed   By: Inez Catalina M.D.   On: 09/10/2021 02:10   CT ABDOMEN W CONTRAST  Result Date: 09/10/2021 CLINICAL DATA:  Nausea, vomiting, and abdominal pain. Abnormal spleen. EXAM: CT ABDOMEN WITH CONTRAST TECHNIQUE: Multidetector CT imaging of the abdomen was performed using the standard protocol following bolus administration of intravenous contrast. RADIATION DOSE REDUCTION: This exam was performed according to the departmental dose-optimization program which includes automated exposure control, adjustment of the mA and/or kV according to patient size and/or use of iterative reconstruction technique. CONTRAST:  141mL OMNIPAQUE IOHEXOL 300 MG/ML  SOLN COMPARISON:  09/10/2021 FINDINGS: Lower chest: Heart is enlarged and coronary artery calcifications are noted. The distal tip of a central venous catheter terminates in the right atrium. There is a small left pleural effusion with atelectasis or  infiltrate at the left lung base. Mild atelectasis is noted at the right lung base. Hepatobiliary: No focal liver abnormality is seen. No gallstones, gallbladder wall thickening, or biliary dilatation. Pancreas: Unremarkable. No pancreatic ductal dilatation or surrounding inflammatory changes. Spleen: Spleen is normal in size. There are linear hypodense bands in the upper and lower spleen with a trace amount of perisplenic free fluid. Adrenals/Urinary Tract: No adrenal nodule or mass. Renal atrophy is noted bilaterally. Vascular calcifications are noted bilaterally. No hydroureteronephrosis bilaterally. Stomach/Bowel: Stomach is within normal limits. The appendix is excluded from the field of view. No evidence of bowel wall thickening, distention, or inflammatory changes. Vascular/Lymphatic: Aortic atherosclerosis. No enlarged abdominal or pelvic lymph nodes. Other: Trace amount of free fluid in the perisplenic space. Musculoskeletal: Degenerative changes in the thoracolumbar spine. No acute osseous abnormality. IMPRESSION: 1. Hypoenhancing bands in the spleen with a small amount of perisplenic free fluid, suggesting infarcts. Laceration or rupture could also be considered if there is a history of trauma. 2. Trace left pleural effusion with atelectasis or infiltrate. 3. Bilateral renal atrophy. 4. Aortic atherosclerosis. Electronically Signed   By: Brett Fairy M.D.   On: 09/10/2021 03:25   DG Chest Port 1 View  Result Date: 09/10/2021 CLINICAL DATA:  Shortness of breath and cough. EXAM: PORTABLE CHEST 1 VIEW COMPARISON:  Sep 03, 2021 FINDINGS: There is stable right-sided venous catheter positioning. There is stable moderate to marked severity enlargement of the cardiac silhouette. There is no evidence of acute infiltrate, pleural effusion or pneumothorax the visualized skeletal structures are unremarkable. IMPRESSION: Stable cardiomegaly without evidence of acute or active cardiopulmonary disease.  Electronically Signed   By: Virgina Norfolk M.D.   On: 09/10/2021 01:16    Procedures Procedures   CRITICAL CARE Performed by: Quintella Reichert   Total critical care time: 45 minutes  Critical care time was exclusive of separately billable procedures and treating other patients.  Critical care was necessary to treat or prevent imminent or life-threatening deterioration.  Critical care was time spent personally by me on the following activities: development of treatment plan  with patient and/or surrogate as well as nursing, discussions with consultants, evaluation of patient's response to treatment, examination of patient, obtaining history from patient or surrogate, ordering and performing treatments and interventions, ordering and review of laboratory studies, ordering and review of radiographic studies, pulse oximetry and re-evaluation of patient's condition.  Medications Ordered in ED Medications  vancomycin (VANCOREADY) IVPB 2000 mg/400 mL (2,000 mg Intravenous New Bag/Given 09/10/21 0552)  vancomycin (VANCOCIN) IVPB 1000 mg/200 mL premix (has no administration in time range)  ondansetron (ZOFRAN-ODT) disintegrating tablet 4 mg (4 mg Oral Given 09/10/21 0011)  glucagon (human recombinant) (GLUCAGEN) injection 1 mg (1 mg Intramuscular Given 09/10/21 0012)  dextrose (GLUTOSE) 40 % oral gel (  Given 09/10/21 0035)  dextrose 50 % solution 25 mL (25 mLs Intravenous Given 09/10/21 0059)  dextrose 50 % solution 25 mL (25 mLs Intravenous Given 09/10/21 0202)  fentaNYL (SUBLIMAZE) injection 50 mcg (50 mcg Intravenous Given 09/10/21 0219)  iohexol (OMNIPAQUE) 300 MG/ML solution 100 mL (100 mLs Intravenous Contrast Given 09/10/21 0307)  ceFEPIme (MAXIPIME) 2 g in sodium chloride 0.9 % 100 mL IVPB (0 g Intravenous Stopped 09/10/21 0550)    ED Course/ Medical Decision Making/ A&P                           Medical Decision Making Amount and/or Complexity of Data Reviewed Labs: ordered. Radiology:  ordered.  Risk Prescription drug management. Decision regarding hospitalization.   Patient with history of ESRD on hemodialysis, recent right lower extremity AKA here for evaluation of 1 week of abdominal pain, vomiting.  Patient ill-appearing on ED arrival with hypoglycemia.  She was treated with oral glucose without improvement.  She was also treated with a half amp of D50 with persistent hypoglycemia and required additional D50.  CBC with leukocytosis, similar when compared to priors.  Initial CT abdomen pelvis obtained due to abdominal pain, constipation with no evidence of obstruction.  Discussed with radiologist concern for possible splenic issue-will check CT abdomen with IV contrast to further evaluate. CT abdomen with IV contrast concerning for splenic infarction.  During ED stay patient did develop fever to 101.6.  She was started on broad spectrum antibiotics for possible sepsis versus splenic abscess.  Lactate and cultures added on.  Multiple staff members attempted to obtain additional blood for cultures-unable to obtain at this time.  On repeat assessments her blood sugar did improve.  Discussed with patient findings of studies and recommendation for admission and she is in agreement with treatment plan.  Hospitalist consulted for admission.        Final Clinical Impression(s) / ED Diagnoses Final diagnoses:  Splenic infarction  Sepsis, due to unspecified organism, unspecified whether acute organ dysfunction present ([place])  ESRD (end stage renal disease) on dialysis ([place])  Left upper quadrant abdominal pain    Rx / DC Orders ED Discharge Orders     None         Quintella Reichert, MD 09/10/21 [phone]

## 2021-09-10 NOTE — ED Notes (Signed)
MD Chand at bedside.

## 2021-09-10 NOTE — ED Triage Notes (Signed)
Pt BIB EMSfor abd pain, decrease appetite, constipation. Pt is dialysis pt. Pt reports AKA performed 2 weeks ago. Pt BS in triage 44. Unable to gain IV access. Pt given IM gluc.

## 2021-09-10 NOTE — ED Notes (Signed)
This RN paged MD Dahal to notify him of pt's recent CBG of 36. MD called this nurse back and inquired on patient's orientation and alertness and told him to continue to keep him updated.

## 2021-09-10 NOTE — ED Notes (Signed)
Help straighten patient up in the bed

## 2021-09-10 NOTE — Procedures (Signed)
Interventional Radiology Procedure Note  Procedure:  1) Ultrasound guided access of the right IJ 2) Limited right central venogram 3) Ultrasound guided access of the right basilic vein 4) Image guided double lumen midline placement  Findings: Please refer to procedural dictation for full description. Right IJ patent peripheral to indwelling line, however unable to pass catheter and wire beyond indwelling line.  Tiny left external jugular versus perithyroidal collateral without definite evidence of direct central vein communication.  Therefore right basilic vein "midline" (traditional double lumen PICC cut to 15 cm) was placed without complication.    Complications: None immediate  Estimated Blood Loss: < 5 mL  Recommendations: RUE midline ready for immediate use.   Ruthann Cancer, MD

## 2021-09-10 NOTE — ED Notes (Signed)
Lunch order placed

## 2021-09-10 NOTE — ED Provider Triage Note (Signed)
Emergency Medicine Provider Triage Evaluation Note  Courtney Gray , a 51 y.o. female  was evaluated in triage.  Pt complains of concern for lower abdominal pain, decreased appetite, and intractable nausea with greater than 20 episodes of NBNB emesis this evening.  Recent AKA of the right leg 2 weeks ago.  No fevers or chills at home.    Review of Systems  Positive: Nausea, vomiting, constipation, decreased appetite Negative: Chest pain, palpitations, sick  Physical Exam  There were no vitals taken for this visit. Gen:   Awake, no distress   Resp:  Normal effort on baseline 3 L of oxygen MSK:   Moves extremities without difficulty  Other:  Right AKA with surgical site well-healing without erythema, induration, or purulent drainage.  Vomiting throughout exam.  Medical Decision Making  Medically screening exam initiated at 12:06 AM.  Appropriate orders placed.  Courtney Gray was informed that the remainder of the evaluation will be completed by another provider, this initial triage assessment does not replace that evaluation, and the importance of remaining in the ED until their evaluation is complete.  CBG critically low, 44.  Patient refusing all p.o. supplementation.  History of dialysis Tuesday/Thursday/Saturday with last session on Saturday.  Very hard IV stick, unable to obtain access in triage.  Glucagon administered IM and charge RN informed of need for room.  Patient transferred to room 15.  This chart was dictated using voice recognition software, Dragon. Despite the best efforts of this provider to proofread and correct errors, errors may still occur which can change documentation meaning.    Emeline Darling, PA-C 09/10/21 0017

## 2021-09-10 NOTE — Progress Notes (Signed)
This RN transported patient to Interventional Radiology, Right upper arm double lumen midline placed, intravenous dextrose given ASAP due to blood glucose of 47 prior to procedure. Recheck blood glucose 119.  Updates given to patient's RN.  Patient appreciative of her care.  Dewaine Oats

## 2021-09-10 NOTE — ED Notes (Signed)
Pt transported to CT ?

## 2021-09-10 NOTE — Progress Notes (Signed)
Pharmacy Antibiotic Note  Courtney Gray is a 51 y.o. female admitted on 09/09/2021 with sepsis.  Pharmacy has been consulted for Vancomycin and Cefepime dosing.  Plan: Cefepime 2g IV x 1 given in ED on 6/6 04:53 Schedule cefepime 2g IV qHD TTS Continue Vancomycin 1000 mg IV qHD TTS Trend WBC, temp, HD schedule F/U infectious work-up Vancomycin levels as indicated   Temp (24hrs), Avg:100.1 F (37.8 C), Min:99 F (37.2 C), Max:101.6 F (38.7 C)  Recent Labs  Lab 09/03/21 1840 09/03/21 2040 09/10/21 0031 09/10/21 0040 09/10/21 0530 09/10/21 0920  WBC 13.3*  --  14.2*  --   --  36.0*  CREATININE  --  7.16* 10.19* 11.20*  --  10.70*  LATICACIDVEN  --   --   --   --  3.7* 2.2*     Estimated Creatinine Clearance: 9.3 mL/min (A) (by C-G formula based on SCr of 10.7 mg/dL (H)).    Allergies  Allergen Reactions   Benzonatate Other (See Comments)    Hallucinations   Hydralazine Other (See Comments)    Hallucinations   Amoxicillin Itching and Nausea And Vomiting   Clonidine Other (See Comments)    Altered mental state, insomnia    Chlorhexidine Itching   Morphine Itching   Oxycodone Itching    Pt tolerated hospital admission 07/2021    Antimicrobials this admission: Cefepime 6/6 >>  Vancomycin 6/6 >>  Dose adjustments this admission: N/A  Microbiology results: 6/6 BCx: pending  Thank you for allowing pharmacy to be a part of this patient's care.  Kaleen Mask 09/10/2021 11:04 AM  Luisa Hart, PharmD, BCPS Clinical Pharmacist 09/10/2021 11:03 AM   Please refer to [facility] for pharmacy phone number

## 2021-09-10 NOTE — Consult Note (Signed)
[facility] Renal Consultation Note  Indication for Consultation:  Management of ESRD/hemodialysis; anemia, hypertension/volume and secondary hyperparathyroidism  HPI: Courtney Gray is a 51 y.o. female with ESRD (HD TTS [name] unit),DM T 2, morbid obesity, htn, chronic Diast. HF, (home oxygen) severe PAD SP Dr. Carlis Abbott R AKA 08/14/21 presents to the ER via EMS was abdominal pain complaints.  Reports increasing abdominal pain unable to move bowels at least last 5 days, they are soon to be hypoglycemic given several doses of IV dextrose, CT abdomen pelvis= suggesting perisplenic infarct.  left lower pleural effusion with atelectasis or infiltrate //was given 50 mcg of fentanyl because of severe pain ER group and being admitted to hospital. Consulted for hemodialysis last dialysis on schedule Saturday.  ER labs K3.3 post 75, WBC 36.0, Hgb 8.8 PLT 325 Note initially presentation BP 329/92 now systolic in the 42A after IV pain meds.  Temp spike to 101.6 in ER  Seen in the ER reporting abdominal discomfort but not in distress.  In addition abdominal pain reports right foot discomfort first 3 toes darkening but no ulcer.  Reports right AKA stump healing but having to take lots of pain medicine for discomfort in R stump and also left foot pain    Past Medical History:  Diagnosis Date   Arthritis    Diabetes mellitus without complication ([place])    Dialysis complication, subsequent encounter    Gout    High cholesterol    Hypertension    Renal disorder     Past Surgical History:  Procedure Laterality Date   ABDOMINAL AORTOGRAM W/LOWER EXTREMITY N/A 08/07/2021   Procedure: ABDOMINAL AORTOGRAM W/LOWER EXTREMITY;  Surgeon: Broadus John, MD;  Location: [place] [facility];  Service: Cardiovascular;  Laterality: N/A;   AMPUTATION Right 08/14/2021   Procedure: AMPUTATION ABOVE KNEE;  Surgeon: Marty Heck, MD;  Location: [place];  Service: Vascular;  Laterality: Right;   AV  FISTULA PLACEMENT     x2, unsuccessful.    IR FLUORO GUIDE CV LINE LEFT  12/14/2020   IR FLUORO GUIDE CV LINE LEFT  02/20/2021   IR FLUORO GUIDE CV LINE RIGHT  08/28/2020   IR FLUORO GUIDE CV LINE RIGHT  05/13/2021   IR FLUORO GUIDE CV LINE RIGHT  05/24/2021   IR PERC TUN PERIT CATH WO PORT S&I /IMAG  09/06/2020   IR PTA VENOUS EXCEPT DIALYSIS CIRCUIT  02/20/2021   IR PTA VENOUS EXCEPT DIALYSIS CIRCUIT  05/13/2021   IR REMOVAL TUN CV CATH W/O FL  05/24/2021   IR US GUIDE VASC ACCESS LEFT  09/06/2020   IR US GUIDE VASC ACCESS RIGHT  05/24/2021   IR VENOCAVAGRAM SVC  02/20/2021      Family History  Problem Relation Age of Onset   Diabetes Mother    Diabetes Father       reports that she has never smoked. She has never used smokeless tobacco. She reports that she does not drink alcohol and does not use drugs.   Allergies  Allergen Reactions   Benzonatate Other (See Comments)    Hallucinations   Hydralazine Other (See Comments)    Hallucinations   Amoxicillin Itching and Nausea And Vomiting   Clonidine Other (See Comments)    Altered mental state, insomnia    Chlorhexidine Itching   Morphine Itching   Oxycodone Itching    Pt tolerated hospital admission 07/2021    Prior to Admission medications   Medication Sig Start Date End Date  Taking? Authorizing Provider  acetaminophen (TYLENOL) 500 MG tablet Take 1,000 mg by mouth as needed for moderate pain.   Yes [provider]  amLODipine (NORVASC) 10 MG tablet Take 1 tablet (10 mg total) by mouth daily. Patient taking differently: Take 10 mg by mouth daily as needed (Blood pressusre). 11/29/20  Yes Roxan Hockey, MD  aspirin EC 81 MG tablet Take 1 tablet (81 mg total) by mouth daily with breakfast. 11/28/20  Yes Emokpae, Courage, MD  atorvastatin (LIPITOR) 10 MG tablet Take 1 tablet (10 mg total) by mouth daily. Patient taking differently: Take 10 mg by mouth at bedtime. 11/28/20  Yes Emokpae, Courage, MD  calcium carbonate (TUMS  SMOOTHIES) 750 MG chewable tablet Chew 1,500 mg by mouth See admin instructions. Take 1500 mg with each meal and 1500 mg with each snack   Yes [provider]  carvedilol (COREG) 6.25 MG tablet Take 1 tablet (6.25 mg total) by mouth 2 (two) times daily with a meal. Patient taking differently: Take 6.25 mg by mouth 2 (two) times daily as needed (if BP > 150). 08/28/21 09/27/21 Yes Pahwani, Einar Grad, MD  diphenhydrAMINE (BENADRYL) 25 MG tablet Take 25 mg by mouth 2 (two) times daily as needed for allergies.   Yes [provider]  docusate sodium (COLACE) 100 MG capsule Take 100 mg by mouth 2 (two) times daily.   Yes [provider]  gabapentin (NEURONTIN) 100 MG capsule Take 1 capsule (100 mg total) by mouth 2 (two) times daily. Patient taking differently: Take 100 mg by mouth See admin instructions. Take 100 mg by mouth twice a day on non dialysis days. On dialysis day take 100 mg in the morning, 200 mg after dialysis, and then 100 mg at bedtime. 08/28/21 09/27/21 Yes Pahwani, Einar Grad, MD  glipiZIDE (GLUCOTROL XL) 2.5 MG 24 hr tablet Take 1 tablet (2.5 mg total) by mouth daily with breakfast. 08/28/21 09/27/21 Yes Pahwani, Einar Grad, MD  HYDROcodone-acetaminophen (NORCO/VICODIN) 5-325 MG tablet Take 1-2 tablets by mouth every 4 (four) hours as needed for moderate pain. Patient taking differently: Take 1 tablet by mouth every 4 (four) hours as needed for moderate pain. 08/19/21  Yes Pahwani, Einar Grad, MD  insulin glargine (LANTUS) 100 UNIT/ML Solostar Pen Inject 12 Units into the skin daily.   Yes [provider]  loperamide (IMODIUM A-D) 2 MG tablet Take 4 mg by mouth as needed for diarrhea or loose stools.   Yes [provider]  nitroGLYCERIN (NITROSTAT) 0.4 MG SL tablet Place 1 tablet (0.4 mg total) under the tongue every 5 (five) minutes as needed for chest pain. 05/24/21  Yes [place] Eric J, DO  pantoprazole (PROTONIX) 40 MG tablet Take 1 tablet (40 mg total) by mouth daily.  11/29/20  Yes Roxan Hockey, MD  simethicone (MYLICON) 262 MG chewable tablet Chew 125 mg by mouth in the morning and at bedtime.   Yes [provider]  tiZANidine (ZANAFLEX) 4 MG capsule Take 4 mg by mouth in the morning and at bedtime.   Yes [provider]  doxycycline (VIBRAMYCIN) 100 MG capsule Take 100 mg by mouth 2 (two) times daily. Patient not taking: Reported on 09/10/2021 08/30/21   [provider]  lisinopril (ZESTRIL) 20 MG tablet Take 1 tablet (20 mg total) by mouth daily. Patient not taking: Reported on 09/10/2021 08/28/21 09/27/21  Darliss Cheney, MD  traMADol (ULTRAM) 50 MG tablet Take 1 tablet (50 mg total) by mouth every 6 (six) hours as needed for moderate pain.  Patient not taking: Reported on 09/10/2021 08/28/21   Darliss Cheney, MD      Results for orders placed or performed during the hospital encounter of 09/09/21 (from the past 48 hour(s))  CBG monitoring, ED     Status: Abnormal   Collection Time: 09/09/21 11:42 PM  Result Value Ref Range   Glucose-Capillary 44 (LL) 70 - 99 mg/dL    Comment: Glucose reference range applies only to samples taken after fasting for at least 8 hours.  Comprehensive metabolic panel     Status: Abnormal   Collection Time: 09/10/21 12:31 AM  Result Value Ref Range   Sodium 135 135 - 145 mmol/L   Potassium 3.4 (L) 3.5 - 5.1 mmol/L   Chloride 95 (L) 98 - 111 mmol/L   CO2 24 22 - 32 mmol/L   Glucose, Bld 42 (LL) 70 - 99 mg/dL    Comment: Glucose reference range applies only to samples taken after fasting for at least 8 hours. CRITICAL RESULT CALLED TO, READ BACK BY AND VERIFIED WITH: SANGELANG B,RN 09/10/21 0119 WAYK    BUN 49 (H) 6 - 20 mg/dL   Creatinine, Ser 10.19 (H) 0.44 - 1.00 mg/dL   Calcium 8.4 (L) 8.9 - 10.3 mg/dL   Total Protein 7.8 6.5 - 8.1 g/dL   Albumin 2.8 (L) 3.5 - 5.0 g/dL   AST 18 15 - 41 U/L   ALT 11 0 - 44 U/L   Alkaline Phosphatase 79 38 - 126 U/L   Total Bilirubin 0.7 0.3 - 1.2 mg/dL   GFR,  Estimated 4 (L) >60 mL/min    Comment: (NOTE) Calculated using the CKD-EPI Creatinine Equation (2021)    Anion gap 16 (H) 5 - 15    Comment: Performed at [facility], [place] [address], [place], [place] [address]  Lipase, blood     Status: None   Collection Time: 09/10/21 12:31 AM  Result Value Ref Range   Lipase 37 11 - 51 U/L    Comment: Performed at [facility], [place] [address], [place], [place] [address]  CBC with Differential     Status: Abnormal   Collection Time: 09/10/21 12:31 AM  Result Value Ref Range   WBC 14.2 (H) 4.0 - 10.5 K/uL   RBC 3.40 (L) 3.87 - 5.11 MIL/uL   Hemoglobin 9.5 (L) 12.0 - 15.0 g/dL   HCT 32.3 (L) 36.0 - 46.0 %   MCV 95.0 80.0 - 100.0 fL   MCH 27.9 26.0 - 34.0 pg   MCHC 29.4 (L) 30.0 - 36.0 g/dL   RDW 18.6 (H) 11.5 - 15.5 %   Platelets 384 150 - 400 K/uL   nRBC 0.1 0.0 - 0.2 %   Neutrophils Relative % 92 %   Neutro Abs 13.1 (H) 1.7 - 7.7 K/uL   Lymphocytes Relative 4 %   Lymphs Abs 0.6 (L) 0.7 - 4.0 K/uL   Monocytes Relative 3 %   Monocytes Absolute 0.4 0.1 - 1.0 K/uL   Eosinophils Relative 1 %   Eosinophils Absolute 0.1 0.0 - 0.5 K/uL   Basophils Relative 0 %   Basophils Absolute 0.0 0.0 - 0.1 K/uL   Immature Granulocytes 0 %   Abs Immature Granulocytes 0.05 0.00 - 0.07 K/uL    Comment: Performed at [facility], [place] [address] [place], [place] [address]  Troponin I (High Sensitivity)     Status: Abnormal   Collection Time: 09/10/21 12:31 AM  Result Value Ref  Range   Troponin I (High Sensitivity) 33 (H) <18 ng/L    Comment: (NOTE) Elevated high sensitivity troponin I (hsTnI) values and significant  changes across serial measurements may suggest ACS but many other  chronic and acute conditions are known to elevate hsTnI results.  Refer to the Links section for chest pain algorithms and additional  guidance. Performed at [facility], [place] [address] [place], [place] [address]   I-stat chem 8, ED     Status:  Abnormal   Collection Time: 09/10/21 12:40 AM  Result Value Ref Range   Sodium 135 135 - 145 mmol/L   Potassium 3.3 (L) 3.5 - 5.1 mmol/L   Chloride 96 (L) 98 - 111 mmol/L   BUN 48 (H) 6 - 20 mg/dL   Creatinine, Ser 11.20 (H) 0.44 - 1.00 mg/dL   Glucose, Bld 39 (LL) 70 - 99 mg/dL    Comment: Glucose reference range applies only to samples taken after fasting for at least 8 hours.   Calcium, Ion 0.95 (L) 1.15 - 1.40 mmol/L   TCO2 25 22 - 32 mmol/L   Hemoglobin 11.9 (L) 12.0 - 15.0 g/dL   HCT 35.0 (L) 36.0 - 46.0 %   Comment NOTIFIED PHYSICIAN   CBG monitoring, ED     Status: Abnormal   Collection Time: 09/10/21  1:50 AM  Result Value Ref Range   Glucose-Capillary 61 (L) 70 - 99 mg/dL    Comment: Glucose reference range applies only to samples taken after fasting for at least 8 hours.   Comment 1 Notify RN   Troponin I (High Sensitivity)     Status: Abnormal   Collection Time: 09/10/21  3:40 AM  Result Value Ref Range   Troponin I (High Sensitivity) 57 (H) <18 ng/L    Comment: RESULT CALLED TO, READ BACK BY AND VERIFIED WITH: SANGELANG B,RN 09/10/21 0459 WAYK (NOTE) Elevated high sensitivity troponin I (hsTnI) values and significant  changes across serial measurements may suggest ACS but many other  chronic and acute conditions are known to elevate hsTnI results.  Refer to the Links section for chest pain algorithms and additional  guidance. Performed at [facility], [place] [address], [place], [place] [address]   Protime-INR     Status: Abnormal   Collection Time: 09/10/21  4:03 AM  Result Value Ref Range   Prothrombin Time 16.1 (H) 11.4 - 15.2 seconds   INR 1.3 (H) 0.8 - 1.2    Comment: (NOTE) INR goal varies based on device and disease states. Performed at [facility], [place] [address] [place], [place] [address]   CBG monitoring, ED     Status: Abnormal   Collection Time: 09/10/21  4:10 AM  Result Value Ref Range   Glucose-Capillary 132 (H) 70 - 99 mg/dL     Comment: Glucose reference range applies only to samples taken after fasting for at least 8 hours.  Type and screen [place]     Status: None   Collection Time: 09/10/21  5:30 AM  Result Value Ref Range   ABO/RH(D) O POS    Antibody Screen NEG    Sample Expiration      09/13/2021,2359 Performed at [facility], [place] [address], [place], [place] [address]   Lactic acid, plasma     Status: Abnormal   Collection Time: 09/10/21  5:30 AM  Result Value Ref Range   Lactic Acid, Venous 3.7 (HH) 0.5 - 1.9  mmol/L    Comment: CRITICAL RESULT CALLED TO, READ BACK BY AND VERIFIED WITHGillis Ends RN 09/10/2021 7616 DAVISB Performed at [facility], [place] [address] [place], [place] [address]   CBG monitoring, ED     Status: Abnormal   Collection Time: 09/10/21  8:47 AM  Result Value Ref Range   Glucose-Capillary 18 (LL) 70 - 99 mg/dL    Comment: Glucose reference range applies only to samples taken after fasting for at least 8 hours.  CBC     Status: Abnormal   Collection Time: 09/10/21  9:20 AM  Result Value Ref Range   WBC 36.0 (H) 4.0 - 10.5 K/uL   RBC 3.18 (L) 3.87 - 5.11 MIL/uL   Hemoglobin 8.8 (L) 12.0 - 15.0 g/dL   HCT 30.9 (L) 36.0 - 46.0 %   MCV 97.2 80.0 - 100.0 fL   MCH 27.7 26.0 - 34.0 pg   MCHC 28.5 (L) 30.0 - 36.0 g/dL   RDW 18.6 (H) 11.5 - 15.5 %   Platelets 325 150 - 400 K/uL   nRBC 0.0 0.0 - 0.2 %    Comment: Performed at [place] [address], [place], [place] [address]   .  ROS: See HPI   Physical Exam: Vitals:   09/10/21 0601 09/10/21 0819  BP: (!) 127/113 (!) 148/63  Pulse: 86 84  Resp: 18 18  Temp:  99 F (37.2 C)  SpO2: 98% 100%     General: Obese female appears older than stated age chronically ill-appearing not in distress currently complaining of abdominal discomfort HEENT: [place], nonicteric, MM dry Neck: No JVD Heart: RRR 2/6 SEM, no rub appreciated2 Lungs: CTA, unlabored breathing 2 L nasal cannula  oxygen Abdomen: Overly obese NABS left-sided abdominal tenderness, soft, abdominal wall edema Extremities: Right AKA stump with surgical staples present minimal edema, Lfirst 3 toes  dusky discoloration, cool to touch ft lower extremity fistula Skin: Left foot discoloration of toes.   Neuro: Alert, O x3 moves all extremities independently no asterixis Dialysis Access: Right IJ tunnel cath nontender no discharge appreciated  Dialysis Orders: [name]  4h 75min  151 kg  1K/2.5Ca bath  TDC  400 BFR  - 4/29 > HBsAg neg w/ Abs < 10 Hep 2,600 load and 1,500 hourly   - mircera 200ug q2 due 09/10/21  - calcitriol 1 ug tiw  - sensipar 30 mg tiw  Assessment/Plan ESRD HD TTS= plan for dialysis today Later today (awaiting BP trend up after given fentanyl in ER), hold heparin on HD Abdominal pain splenic infarct on CT= plan per admit team Chronic respiratory failure with hypoxia (uses home O2 )-CT also showing pneumonia left lower lobe febrile illness-started on vancomycin and cefepime per admitting /blood cultures ordered-with volume  UF as tolerated Anemia of ESRD /acute illness-Hgb 8.8 ESA due today Aranesp 485 mcg Metabolic bone disease -Hectorol) on HD, phosphorus was 5.5 use binders when taking p.o.'s follow-up lab trend PAD status post right AKA/ LLE PAD= VVS following Diabetes mellitus type 2= per admitting  Ernest Haber, PA-C [place] [phone] 09/10/2021, 9:51 AM

## 2021-09-10 NOTE — ED Notes (Signed)
Attempted to call report x 1  

## 2021-09-10 NOTE — IPAL (Signed)
  Interdisciplinary Goals of Care Family Meeting   Date carried out: 09/10/2021  Location of the meeting: Conference room  Member's involved: Nurse Practitioner and Family Member or next of kin  Durable Power of Attorney or acting medical decision maker: Daughter, Lorenso Courier, and Sister.    Discussion: We discussed goals of care for Courtney Gray .  We reviewed patients extensive medical history, most recent hospitalization and current concerns for possible infection, hypotension, ESRD, hypoglycemia, limited IV access and difficulty delivering care patient needs due to access issues. We discussed plan of care moving forward and emergency care was discussed.  Reviewed with family that if patient became critically ill to the point of needing CPR or mechanical ventilation, that these interventions likely would not prolong her life, but rather add to her suffering.  Her daughter is aware of the nature of her mothers illness and complexity of her care.  She and her aunt discussed this very thing this am.  They are concerned about her decline since the beginning of the year and her ability to get back to independent self care.  Encouraged the family to talk about the circumstances of cardiac arrest and mechanical ventilation.  Support offered.  Will need further discussion.   Code status: Full Code  Disposition: Continue current acute care  Time spent for the meeting: 19 minutes   Noe Gens, MSN, APRN, NP-C, AGACNP-BC [facility] 09/10/2021, 3:12 PM   Please see [email or web address] for pager details.   From 7A-7P if no response, please call [phone] After hours, please call ELink [phone]

## 2021-09-10 NOTE — Assessment & Plan Note (Signed)
   Patient receives hemodialysis Tuesday Thursday Saturday  Case discussed with Dr. Posey Pronto with nephrology who will consult for resumption of hemodialysis services

## 2021-09-10 NOTE — Assessment & Plan Note (Signed)
   Currently normotensive  Holding usual regimen of oral hypoglycemics, will resume when able  As needed intravenous antihypertensives for markedly elevated blood pressure

## 2021-09-10 NOTE — H&P (Signed)
History and Physical    Patient: Courtney Gray MRN: [id number] DOA: 09/09/2021  Date of Service: the patient was seen and examined on 09/10/2021  Patient coming from: Home via EMS  Chief Complaint:  Chief Complaint  Patient presents with   Nausea   Constipation    HPI:   51 year old female with past medical history of end-stage renal disease (Tues Thurs, Saturday) , chronic diastolic congestive heart failure (Echo 05/2021 ef 55-60% with G2DD), coronary artery disease (S/P NSTEMI 05/2021), critical limb ischemia and gangrene of the right lower extremity with recent right AKA performed on 5/10 performed by Dr. Carlis Abbott, insulin-dependent diabetes mellitus type 2 (Hgb A1C 07/09/2021 5.7%), chronic hypoxic respiratory failure (on 2LPM via [place] at home), hyperlipidemia, hypertension, gout who presents to [facility] emergency department via EMS due to complaints of abdominal pain.  Patient explains that for the past week she has been experiencing generalized abdominal discomfort that she is attributed to being constipated.  This discomfort went on for the next 5 days and patient continued to not be able to move her bowels.  Patient denies any associated nausea or vomiting.  Approximately 48 hours ago, the patient began to experience a severe left upper quadrant pain.  Patient describes this pain as sharp in quality, nonradiating and worse with any movement or deep inspiration.  As a result it is been difficult for the patient to take a deep breath or ambulate.  Pain has been unrelenting with no alleviating factors.  Patient continued to not be able to move her bowels for the next 48 hours.  Upon further questioning patient denies any trauma, recent travel or sick contacts.  Patient symptoms continued to worsen until EMS was contacted.  They promptly came to evaluate the patient and brought her into [facility] emergency department for evaluation.  Upon evaluation in the emergency  department patient was initially found to be hypoglycemic which was addressed with several doses of intravenous dextrose.  CT imaging of the abdomen and pelvis with contrast revealed hypoenhancing bands in the spleen with a small amount of perisplenic free fluid suggesting infarct.  Patient also exhibited a fever during their stay in the emergency department prompting the ER provider to initiate intravenous vancomycin and cefepime.  50 mcg of fentanyl were administered for the patient's severe abdominal pain.  The hospitalist group was then called to assess the patient for admission to the hospital.  Review of Systems: Review of Systems  Gastrointestinal:  Positive for abdominal pain and constipation.  Neurological:  Positive for weakness.  All other systems reviewed and are negative.   Past Medical History:  Diagnosis Date   Arthritis    Diabetes mellitus without complication ([place])    Dialysis complication, subsequent encounter    Gout    High cholesterol    Hypertension    Renal disorder     Past Surgical History:  Procedure Laterality Date   ABDOMINAL AORTOGRAM W/LOWER EXTREMITY N/A 08/07/2021   Procedure: ABDOMINAL AORTOGRAM W/LOWER EXTREMITY;  Surgeon: Broadus John, MD;  Location: [place] [facility];  Service: Cardiovascular;  Laterality: N/A;   AMPUTATION Right 08/14/2021   Procedure: AMPUTATION ABOVE KNEE;  Surgeon: Marty Heck, MD;  Location: [place];  Service: Vascular;  Laterality: Right;   AV FISTULA PLACEMENT     x2, unsuccessful.    IR FLUORO GUIDE CV LINE LEFT  12/14/2020   IR FLUORO GUIDE CV LINE LEFT  02/20/2021   IR FLUORO GUIDE CV LINE  RIGHT  08/28/2020   IR FLUORO GUIDE CV LINE RIGHT  05/13/2021   IR FLUORO GUIDE CV LINE RIGHT  05/24/2021   IR PERC TUN PERIT CATH WO PORT S&I /IMAG  09/06/2020   IR PTA VENOUS EXCEPT DIALYSIS CIRCUIT  02/20/2021   IR PTA VENOUS EXCEPT DIALYSIS CIRCUIT  05/13/2021   IR REMOVAL TUN CV CATH W/O FL  05/24/2021   IR US GUIDE VASC ACCESS  LEFT  09/06/2020   IR US GUIDE VASC ACCESS RIGHT  05/24/2021   IR VENOCAVAGRAM SVC  02/20/2021    Social History:  reports that she has never smoked. She has never used smokeless tobacco. She reports that she does not drink alcohol and does not use drugs.  Allergies  Allergen Reactions   Benzonatate Other (See Comments)    Hallucinations   Hydralazine Other (See Comments)    Hallucinations   Amoxicillin Itching and Nausea And Vomiting   Clonidine Other (See Comments)    Altered mental state, insomnia    Chlorhexidine Itching   Morphine Itching   Oxycodone Itching    Pt tolerated hospital admission 07/2021    Family History  Problem Relation Age of Onset   Diabetes Mother    Diabetes Father     Prior to Admission medications   Medication Sig Start Date End Date Taking? Authorizing Provider  acetaminophen (TYLENOL) 500 MG tablet Take 1,000 mg by mouth every 6 (six) hours as needed for moderate pain or headache.    [provider]  amLODipine (NORVASC) 10 MG tablet Take 1 tablet (10 mg total) by mouth daily. Patient taking differently: Take 10 mg by mouth daily as needed (Blood pressusre). 11/29/20   Roxan Hockey, MD  aspirin EC 81 MG tablet Take 1 tablet (81 mg total) by mouth daily with breakfast. 11/28/20   Denton Brick, Courage, MD  atorvastatin (LIPITOR) 10 MG tablet Take 1 tablet (10 mg total) by mouth daily. Patient taking differently: Take 10 mg by mouth at bedtime. 11/28/20   Roxan Hockey, MD  calcium carbonate (TUMS SMOOTHIES) 750 MG chewable tablet Chew 750-1,500 mg by mouth See admin instructions. Take 1500 mg with each meal and 750 mg with each snack    [provider]  carvedilol (COREG) 6.25 MG tablet Take 1 tablet (6.25 mg total) by mouth 2 (two) times daily with a meal. 08/28/21 09/27/21  Darliss Cheney, MD  diphenhydrAMINE (BENADRYL) 25 MG tablet Take 25 mg by mouth daily as needed for allergies or sleep.    [provider]  docusate sodium  (COLACE) 100 MG capsule Take 100 mg by mouth 2 (two) times daily.    [provider]  doxycycline (VIBRAMYCIN) 100 MG capsule Take 100 mg by mouth 2 (two) times daily. 08/30/21   [provider]  gabapentin (NEURONTIN) 100 MG capsule Take 1 capsule (100 mg total) by mouth 2 (two) times daily. 08/28/21 09/27/21  Darliss Cheney, MD  glipiZIDE (GLUCOTROL XL) 2.5 MG 24 hr tablet Take 1 tablet (2.5 mg total) by mouth daily with breakfast. 08/28/21 09/27/21  Darliss Cheney, MD  HYDROcodone-acetaminophen (NORCO/VICODIN) 5-325 MG tablet Take 1-2 tablets by mouth every 4 (four) hours as needed for moderate pain. 08/19/21   Darliss Cheney, MD  lisinopril (ZESTRIL) 20 MG tablet Take 1 tablet (20 mg total) by mouth daily. 08/28/21 09/27/21  Darliss Cheney, MD  loperamide (IMODIUM A-D) 2 MG tablet Take 4 mg by mouth as needed for diarrhea or loose stools.    [provider]  nitroGLYCERIN (NITROSTAT) 0.4 MG SL tablet Place 1 tablet (0.4 mg total) under the tongue every 5 (five) minutes as needed for chest pain. 05/24/21   [place] Eric J, DO  pantoprazole (PROTONIX) 40 MG tablet Take 1 tablet (40 mg total) by mouth daily. 11/29/20   Roxan Hockey, MD  simethicone (MYLICON) 867 MG chewable tablet Chew 125 mg by mouth 2 (two) times daily as needed for flatulence.    [provider]  traMADol (ULTRAM) 50 MG tablet Take 1 tablet (50 mg total) by mouth every 6 (six) hours as needed for moderate pain. 08/28/21   Darliss Cheney, MD    Physical Exam:  Vitals:   09/10/21 0417 09/10/21 0601 09/10/21 0819 09/10/21 0900  BP:  (!) 127/113 (!) 148/63   Pulse:  86 84   Resp:  18 18   Temp: (!) 101.6 F (38.7 C)  99 F (37.2 C)   TempSrc: Rectal  Oral   SpO2:  98% 100%   Weight:    (!) 150.6 kg  Height:    5\' 5"  (1.651 m)    Constitutional: Patient is lethargic but arousable and oriented x3.  Patient is in distress due to abdominal pain. Skin: Some dusky discoloration of the first through the  fourth toes of the left foot.  No lesions, poor skin turgor noted. Eyes: Pupils are equally reactive to light.  No evidence of scleral icterus or conjunctival pallor.  ENMT: Slightly dry mucous membranes noted.  Posterior pharynx clear of any exudate or lesions.   Neck: normal, supple, no masses, no thyromegaly.  No evidence of jugular venous distension.   Respiratory: Notable left basilar rales without any evidence of wheezing.  Normal respiratory effort. No accessory muscle use.  Cardiovascular: 3 out of 6 systolic murmur noted.  Regular rate and rhythm.  No extremity edema. Unable to palpate pulses of left foot, verifiable by doppler however.  . No carotid bruits.  Chest:   Nontender without crepitus or deformity.   Back:   Nontender without crepitus or deformity. Abdomen: Severe left-sided abdominal tenderness noted on exam.  Abdomen is soft however.  No evidence of intra-abdominal masses.  Positive bowel sounds noted in all quadrants.   Musculoskeletal: Area of dusky discoloration of third digit of left handWith some surrounding erythema. Additional areas of first 3 digits of the left foot dusky discoloration of the foot are cool to the touch. Patient is status post  Right AKA Neurologic: Lethargic but arousable.  CN 2-12 grossly intact. Sensation intact.  Patient moving all 4 extremities spontaneously.  Patient is following all commands.  Patient is responsive to verbal stimuli.   Psychiatric: Patient exhibits depressed mood with flat affect.   Patient seems to possess insight as to their current situation.    Data Reviewed:  I have personally reviewed and interpreted labs, imaging.  Significant findings are:    Chemistry revealed sodium 135, potassium 3.4, chloride 95, bicarbonate 24, glucose 42, BUN 49, creatinine 10.19. Lipase 37 CBC revealing white blood cell count of 14.2, hemoglobin 9.5, hematocrit 32.3, platelet count of 384 Troponins are 33 and 57. INR 1.3 CT imaging of the  abdomen and pelvis with contrast revealing hypoenhancing bands in the spleen with a small amount of perisplenic free fluid suggesting infarcts.  Trace left pleural effusion with atelectasis or infiltrate.  Bilateral renal atrophy.  EKG: Personally reviewed.  Rhythm is normal sinus rhythm with heart rate of 83 bpm.  Minimal ST segment elevation in the anterior septal  leads but are not significant.    Assessment and Plan: * Splenic infarction Areas of splenic infarction noted on CT imaging of the abdomen pelvis as the cause of the patient's severe abdominal pain This is particularly concerning in the setting of multiple other areas of sudden vascular compromise including a necrotic looking lesion of the third digit of the left hand hand, recent right lower extremity AKA due to acute limb ischemia as well as several dusky cold toes noted of the left foot with a pulse that cannot be palpated. Concern for central embolic process.  Possible intracardiac thrombus or even infective endocarditis considering the patient's fever.   Ordering a transthoracic echocardiogram now which can be done today, additionally have discussed case with cardiology further assistance and have scheduled TEE for Friday.   Heparin infusion will be initiated for anticoagulation Broad-spectrum intravenous antibiotics including vancomycin will be initiated in case of infective endocarditis As needed low-dose opiate based analgesics for substantial associated pain I have additionally asked vascular surgery to consult on this patient to provide input on overall management, their input is appreciated.  Pneumonia of left lower lobe due to infectious organism Considering patient's fever and leukocytosis additional potential source of infection is a developing left lower lobe infiltrate seen on CT imaging of the abdomen pelvis Providing patient with intravenous cefepime in addition to intravenous vancomycin to empirically cover for  potential pneumonia Blood cultures have been ordered and unfortunately staff had had quite a difficult time successfully drawing these from the patient.  I have asked staff to continue to try until we are able to obtain a sample Supplemental oxygen to be titrated for bouts of hypoxia   Lactic acidosis Substantial lactic acidosis noted on initial Possibly secondary to splenic infarction and other areas of poor perfusion or possibly secondary to underlying infectious process Unable to provide patient with aggressive intravenous form resuscitation due to presence of ESRD Treating underlying infection with broad-spectrum antibiotics Performing serial lactic acid levels to ensure downtrending and resolution Getting vascular surgery involved as noted above, their input is appreciated  Elevated troponin level not due myocardial infarction Slightly elevated serial troponins with flat trajectory of elevation Patient is chest pain-free Likely secondary to underlying illness, plaque rupture is unlikely Monitoring patient on telemetry   Chronic respiratory failure with hypoxia (HCC) Continue home regimen supplemental oxygen which be titrated upwards for bouts of hypoxia  ESRD (end stage renal disease) ([place]) Patient receives hemodialysis Tuesday Thursday Saturday Case discussed with Dr. Posey Pronto with nephrology who will consult for resumption of hemodialysis services   Uncontrolled type 2 diabetes mellitus with hypoglycemia, with long-term current use of insulin [facility]) Patient exhibiting recurrent bouts of profound hypoglycemia upon arrival to the emergency department Likely secondary to poor p.o. intake in the setting of ongoing long-acting insulin use and oral hypoglycemics Oral hypoglycemics and insulin have been held Performing Accu-Cheks every 2 hours for now As needed D50 being administered in addition to D10 infusion We will space out Accu-Cheks once patient maintains stable blood  sugars  Essential hypertension Currently normotensive Holding usual regimen of oral hypoglycemics, will resume when able As needed intravenous antihypertensives for markedly elevated blood pressure  Mixed diabetic hyperlipidemia associated with type 2 diabetes mellitus ([place]) Continuing home regimen of lipid lowering therapy.        Code Status:  Full code  code status decision has been confirmed with: daughter/patient Family Communication:  Discussed plan of care with daughter via phone conversation.  Consults: Secure chat  sent to cardiolgy for consult for TEE.  Dr. Donzetta Matters with Vascular Surgery, Dr. Posey Pronto with Nephrology   Severity of Illness:  The appropriate patient status for this patient is INPATIENT. Inpatient status is judged to be reasonable and necessary in order to provide the required intensity of service to ensure the patient's safety. The patient's presenting symptoms, physical exam findings, and initial radiographic and laboratory data in the context of their chronic comorbidities is felt to place them at high risk for further clinical deterioration. Furthermore, it is not anticipated that the patient will be medically stable for discharge from the hospital within 2 midnights of admission.   * I certify that at the point of admission it is my clinical judgment that the patient will require inpatient hospital care spanning beyond 2 midnights from the point of admission due to high intensity of service, high risk for further deterioration and high frequency of surveillance required.*  Author:  Vernelle Emerald MD  09/10/2021 9:33 AM

## 2021-09-11 ENCOUNTER — Inpatient Hospital Stay (HOSPITAL_COMMUNITY): Payer: 59

## 2021-09-11 DIAGNOSIS — Z89611 Acquired absence of right leg above knee: Secondary | ICD-10-CM

## 2021-09-11 DIAGNOSIS — D735 Infarction of spleen: Secondary | ICD-10-CM | POA: Diagnosis not present

## 2021-09-11 DIAGNOSIS — I739 Peripheral vascular disease, unspecified: Secondary | ICD-10-CM

## 2021-09-11 HISTORY — PX: IR FLUORO GUIDE CV LINE LEFT: IMG2282

## 2021-09-11 HISTORY — PX: IR US GUIDE VASC ACCESS LEFT: IMG2389

## 2021-09-11 LAB — CBC WITH DIFFERENTIAL/PLATELET
Abs Immature Granulocytes: 1.96 10*3/uL — ABNORMAL HIGH (ref 0.00–0.07)
Basophils Absolute: 0.1 10*3/uL (ref 0.0–0.1)
Basophils Relative: 0 %
Eosinophils Absolute: 0 10*3/uL (ref 0.0–0.5)
Eosinophils Relative: 0 %
HCT: 30.4 % — ABNORMAL LOW (ref 36.0–46.0)
Hemoglobin: 9 g/dL — ABNORMAL LOW (ref 12.0–15.0)
Immature Granulocytes: 4 %
Lymphocytes Relative: 2 %
Lymphs Abs: 0.8 10*3/uL (ref 0.7–4.0)
MCH: 27.9 pg (ref 26.0–34.0)
MCHC: 29.6 g/dL — ABNORMAL LOW (ref 30.0–36.0)
MCV: 94.1 fL (ref 80.0–100.0)
Monocytes Absolute: 2 10*3/uL — ABNORMAL HIGH (ref 0.1–1.0)
Monocytes Relative: 5 %
Neutro Abs: 39.5 10*3/uL — ABNORMAL HIGH (ref 1.7–7.7)
Neutrophils Relative %: 89 %
Platelets: 328 10*3/uL (ref 150–400)
RBC: 3.23 MIL/uL — ABNORMAL LOW (ref 3.87–5.11)
RDW: 18.9 % — ABNORMAL HIGH (ref 11.5–15.5)
WBC: 44.3 10*3/uL — ABNORMAL HIGH (ref 4.0–10.5)
nRBC: 0 % (ref 0.0–0.2)

## 2021-09-11 LAB — GLUCOSE, CAPILLARY
Glucose-Capillary: 103 mg/dL — ABNORMAL HIGH (ref 70–99)
Glucose-Capillary: 105 mg/dL — ABNORMAL HIGH (ref 70–99)
Glucose-Capillary: 132 mg/dL — ABNORMAL HIGH (ref 70–99)
Glucose-Capillary: 139 mg/dL — ABNORMAL HIGH (ref 70–99)
Glucose-Capillary: 60 mg/dL — ABNORMAL LOW (ref 70–99)
Glucose-Capillary: 62 mg/dL — ABNORMAL LOW (ref 70–99)
Glucose-Capillary: 71 mg/dL (ref 70–99)
Glucose-Capillary: 82 mg/dL (ref 70–99)
Glucose-Capillary: 90 mg/dL (ref 70–99)
Glucose-Capillary: 94 mg/dL (ref 70–99)
Glucose-Capillary: 94 mg/dL (ref 70–99)

## 2021-09-11 LAB — HEPARIN LEVEL (UNFRACTIONATED)
Heparin Unfractionated: 0.86 IU/mL — ABNORMAL HIGH (ref 0.30–0.70)
Heparin Unfractionated: <0.1 IU/mL — ABNORMAL LOW (ref 0.30–0.70)
Heparin Unfractionated: <0.1 IU/mL — ABNORMAL LOW (ref 0.30–0.70)

## 2021-09-11 LAB — COMPREHENSIVE METABOLIC PANEL
ALT: 11 U/L (ref 0–44)
AST: 30 U/L (ref 15–41)
Albumin: 2.4 g/dL — ABNORMAL LOW (ref 3.5–5.0)
Alkaline Phosphatase: 76 U/L (ref 38–126)
Anion gap: 16 — ABNORMAL HIGH (ref 5–15)
BUN: 60 mg/dL — ABNORMAL HIGH (ref 6–20)
CO2: 22 mmol/L (ref 22–32)
Calcium: 7.9 mg/dL — ABNORMAL LOW (ref 8.9–10.3)
Chloride: 94 mmol/L — ABNORMAL LOW (ref 98–111)
Creatinine, Ser: 11.23 mg/dL — ABNORMAL HIGH (ref 0.44–1.00)
GFR, Estimated: 4 mL/min — ABNORMAL LOW (ref >60)
Glucose, Bld: 132 mg/dL — ABNORMAL HIGH (ref 70–99)
Potassium: 4.4 mmol/L (ref 3.5–5.1)
Sodium: 132 mmol/L — ABNORMAL LOW (ref 135–145)
Total Bilirubin: 0.8 mg/dL (ref 0.3–1.2)
Total Protein: 7.1 g/dL (ref 6.5–8.1)

## 2021-09-11 LAB — HEPATITIS B SURFACE ANTIBODY, QUANTITATIVE: Hep B S AB Quant (Post): <3.1 m[IU]/mL — ABNORMAL LOW (ref >9.9)

## 2021-09-11 LAB — PROCALCITONIN: Procalcitonin: 56.09 ng/mL

## 2021-09-11 LAB — LACTIC ACID, PLASMA: Lactic Acid, Venous: 0.9 mmol/L (ref 0.5–1.9)

## 2021-09-11 LAB — MAGNESIUM: Magnesium: 1.8 mg/dL (ref 1.7–2.4)

## 2021-09-11 MED ORDER — DEXTROSE 50 % IV SOLN
INTRAVENOUS | Status: AC
  Filled 2021-09-11: qty 50

## 2021-09-11 MED ORDER — LIDOCAINE HCL 1 % IJ SOLN
INTRAMUSCULAR | Status: AC
  Filled 2021-09-11: qty 20

## 2021-09-11 MED ORDER — FENTANYL CITRATE (PF) 100 MCG/2ML IJ SOLN
INTRAMUSCULAR | Status: AC
  Filled 2021-09-11: qty 2

## 2021-09-11 MED ORDER — GERHARDT'S BUTT CREAM
TOPICAL_CREAM | Freq: Two times a day (BID) | CUTANEOUS | Status: DC
  Administered 2021-09-27 – 2021-10-13 (×2): 1 via TOPICAL
  Administered 2021-10-13: 2 via TOPICAL
  Filled 2021-09-11 (×7): qty 1

## 2021-09-11 MED ORDER — HEPARIN BOLUS VIA INFUSION
2000.0000 [IU] | Freq: Once | INTRAVENOUS | Status: AC
  Administered 2021-09-11: 2000 [IU] via INTRAVENOUS
  Filled 2021-09-11: qty 2000

## 2021-09-11 MED ORDER — DEXTROSE 50 % IV SOLN
12.5000 g | INTRAVENOUS | Status: AC
  Administered 2021-09-11: 12.5 g via INTRAVENOUS

## 2021-09-11 MED ORDER — DICLOFENAC SODIUM 1 % EX GEL
2.0000 g | Freq: Four times a day (QID) | CUTANEOUS | Status: DC
  Administered 2021-09-11 – 2021-10-14 (×35): 2 g via TOPICAL
  Filled 2021-09-11 (×3): qty 100

## 2021-09-11 MED ORDER — NOREPINEPHRINE 4 MG/250ML-% IV SOLN
0.0000 ug/min | INTRAVENOUS | Status: DC
  Administered 2021-09-11: 2 ug/min via INTRAVENOUS
  Administered 2021-09-11: 6 ug/min via INTRAVENOUS
  Filled 2021-09-11 (×2): qty 250

## 2021-09-11 MED ORDER — DEXTROSE 50 % IV SOLN
1.0000 | INTRAVENOUS | Status: AC
Start: 2021-09-11 — End: 2021-09-11
  Administered 2021-09-11: 50 mL via INTRAVENOUS

## 2021-09-11 NOTE — Progress Notes (Signed)
ANTICOAGULATION CONSULT NOTE - Follow Up Consult  Pharmacy Consult for heparin Indication:  splenic infarct  Labs: Recent Labs    09/10/21 0031 09/10/21 0040 09/10/21 0340 09/10/21 0403 09/10/21 0920 09/10/21 1610 09/10/21 1945 09/11/21 0324 09/11/21 1314 09/11/21 2210  HGB 9.5* 11.9*  --   --  8.8*  --   --  9.0*  --   --   HCT 32.3* 35.0*  --   --  30.9*  --   --  30.4*  --   --   PLT 384  --   --   --  325  --   --  328  --   --   LABPROT  --   --   --  16.1*  --   --   --   --   --   --   INR  --   --   --  1.3*  --   --   --   --   --   --   HEPARINUNFRC  --   --   --   --   --   --    < > 0.86* <0.10* <0.10*  CREATININE 10.19* 11.20*  --   --  10.70* 10.95*  --  11.23*  --   --   TROPONINIHS 33*  --  57*  --   --   --   --   --   --   --    < > = values in this interval not displayed.    Assessment: 51yo female subtherapeutic on heparin after rate change; required rate of 2400 units/hr last month for low-therapeutic level; no infusion issues or signs of bleeding per RN.  Goal of Therapy:  Heparin level 0.3-0.7 units/ml   Plan:  Will rebolus with heparin 2000 units and increase heparin infusion by 4 units/kgABW/hr to 2000 units/hr and check level in 8 hours.    Wynona Neat, PharmD, BCPS  09/11/2021,11:30 PM

## 2021-09-11 NOTE — Progress Notes (Addendum)
  Progress Note    09/11/2021 8:04 AM   Subjective:  denies pain in her stump.  Inquires about hand surgery   afebrile  Gtts:  neo, heparin  Vitals:   09/11/21 0730 09/11/21 0735  BP: (!) 136/97   Pulse: 76 85  Resp: 12 (!) 22  Temp:    SpO2: 100% 100%    Physical Exam: General:  no distress Lungs:  non labored Incisions:  right AKA stump looks good Extremities:  + monophasic doppler signals left AT/PT/peroneal; motor and sensory are in tact   CBC    Component Value Date/Time   WBC 44.3 (H) 09/11/2021 0324   RBC 3.23 (L) 09/11/2021 0324   HGB 9.0 (L) 09/11/2021 0324   HCT 30.4 (L) 09/11/2021 0324   PLT 328 09/11/2021 0324   MCV 94.1 09/11/2021 0324   MCH 27.9 09/11/2021 0324   MCHC 29.6 (L) 09/11/2021 0324   RDW 18.9 (H) 09/11/2021 0324   LYMPHSABS 0.8 09/11/2021 0324   MONOABS 2.0 (H) 09/11/2021 0324   EOSABS 0.0 09/11/2021 0324   BASOSABS 0.1 09/11/2021 0324    BMET    Component Value Date/Time   NA 132 (L) 09/11/2021 0324   K 4.4 09/11/2021 0324   CL 94 (L) 09/11/2021 0324   CO2 22 09/11/2021 0324   GLUCOSE 132 (H) 09/11/2021 0324   BUN 60 (H) 09/11/2021 0324   CREATININE 11.23 (H) 09/11/2021 0324   CALCIUM 7.9 (L) 09/11/2021 0324   GFRNONAA 4 (L) 09/11/2021 0324   GFRAA 6 (L) 11/29/2019 0120    INR    Component Value Date/Time   INR 1.3 (H) 09/10/2021 0403     Intake/Output Summary (Last 24 hours) at 09/11/2021 0804 Last data filed at 09/11/2021 0700 Gross per 24 hour  Intake 2197.94 ml  Output --  Net 2197.94 ml     Assessment/Plan:  51 y.o. female is s/p:  S/p right AKA 08/14/2021 by Dr. Carlis Abbott     -leukocytosis-pt afebrile; amputation site looks fine.  Staples to stay for another week. -LLE with monophasic doppler signals AT/PT/peroneal -left radial and ulnar doppler signals present-will need hand surgery consult -CTA chest ordered to rule out embolizing proximal aortic lesion -DVT prophylaxis:  heparin gtt   Leontine Locket,  PA-C [facility] [phone] 09/11/2021 8:04 AM  I have seen and evaluated the patient. I agree with the PA note as documented above. Right AKA looks ok.  Hand surgery consult for left long finger ischemia.  Awaiting CTA Chest for embolic work-up given splenic infarcts.  Echo with no evidence of thrombus.  Marty Heck, MD [facility] Office: [phone]

## 2021-09-11 NOTE — Progress Notes (Signed)
Ok to run levophed through [facility] placed in [employer] per Loanne Drilling MD

## 2021-09-11 NOTE — H&P (View-Only) (Signed)
Reason for Consult:Left long finger ischemia Referring Physician: Rayann Heman Time called: 4709 Time at bedside: [place] is an 51 y.o. female.  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 She was also noted to have ischemic changes to her left long fingertip. This has continued to evolve over the last 5 weeks. She c/o skin changes and pain in the tip. VVS has said she does not have revascularization potential. Hand surgery was consulted today. She is LHD.  Past Medical History:  Diagnosis Date   Arthritis    Diabetes mellitus without complication ([place])    Dialysis complication, subsequent encounter    Gout    High cholesterol    Hypertension    Renal disorder     Past Surgical History:  Procedure Laterality Date   ABDOMINAL AORTOGRAM W/LOWER EXTREMITY N/A 08/07/2021   Procedure: ABDOMINAL AORTOGRAM W/LOWER EXTREMITY;  Surgeon: Broadus John, MD;  Location: [facility];  Service: Cardiovascular;  Laterality: N/A;   AMPUTATION Right 08/14/2021   Procedure: AMPUTATION ABOVE KNEE;  Surgeon: Marty Heck, MD;  Location: [facility];  Service: Vascular;  Laterality: Right;   AV FISTULA PLACEMENT     x2, unsuccessful.    IR FLUORO GUIDE CV LINE LEFT  12/14/2020   IR FLUORO GUIDE CV LINE LEFT  02/20/2021   IR FLUORO GUIDE CV LINE RIGHT  08/28/2020   IR FLUORO GUIDE CV LINE RIGHT  05/13/2021   IR FLUORO GUIDE CV LINE RIGHT  05/24/2021   IR PERC TUN PERIT CATH WO PORT S&I /IMAG  09/06/2020   IR PTA VENOUS EXCEPT DIALYSIS CIRCUIT  02/20/2021   IR PTA VENOUS EXCEPT DIALYSIS CIRCUIT  05/13/2021   IR REMOVAL TUN CV CATH W/O FL  05/24/2021   IR US GUIDE VASC ACCESS LEFT  09/06/2020   IR US GUIDE VASC ACCESS RIGHT  05/24/2021   IR US GUIDE VASC ACCESS RIGHT  09/10/2021   IR VENO/JUGULAR RIGHT  09/10/2021   IR VENOCAVAGRAM SVC  02/20/2021    Family History  Problem Relation Age of Onset    Diabetes Mother    Diabetes Father     Social History:  reports that she has never smoked. She has never used smokeless tobacco. She reports that she does not drink alcohol and does not use drugs.  Allergies:  Allergies  Allergen Reactions   Benzonatate Other (See Comments)    Hallucinations   Hydralazine Other (See Comments)    Hallucinations   Amoxicillin Itching and Nausea And Vomiting   Clonidine Other (See Comments)    Altered mental state, insomnia    Chlorhexidine Itching   Morphine Itching   Oxycodone Itching    Pt tolerated hospital admission 07/2021    Medications: I have reviewed the patient's current medications.  Results for orders placed or performed during the hospital encounter of 09/09/21 (from the past 48 hour(s))  CBG monitoring, ED     Status: Abnormal   Collection Time: 09/09/21 11:42 PM  Result Value Ref Range   Glucose-Capillary 44 (LL) 70 - 99 mg/dL    Comment: Glucose reference range applies only to samples taken after fasting for at least 8 hours.  Comprehensive metabolic panel     Status: Abnormal   Collection Time: 09/10/21 12:31 AM  Result Value Ref Range   Sodium 135 135 - 145 mmol/L   Potassium 3.4 (L) 3.5 -  5.1 mmol/L   Chloride 95 (L) 98 - 111 mmol/L   CO2 24 22 - 32 mmol/L   Glucose, Bld 42 (LL) 70 - 99 mg/dL    Comment: Glucose reference range applies only to samples taken after fasting for at least 8 hours. CRITICAL RESULT CALLED TO, READ BACK BY AND VERIFIED WITH: SANGELANG B,RN 09/10/21 0119 WAYK    BUN 49 (H) 6 - 20 mg/dL   Creatinine, Ser 10.19 (H) 0.44 - 1.00 mg/dL   Calcium 8.4 (L) 8.9 - 10.3 mg/dL   Total Protein 7.8 6.5 - 8.1 g/dL   Albumin 2.8 (L) 3.5 - 5.0 g/dL   AST 18 15 - 41 U/L   ALT 11 0 - 44 U/L   Alkaline Phosphatase 79 38 - 126 U/L   Total Bilirubin 0.7 0.3 - 1.2 mg/dL   GFR, Estimated 4 (L) >60 mL/min    Comment: (NOTE) Calculated using the CKD-EPI Creatinine Equation (2021)    Anion gap 16 (H) 5 - 15     Comment: Performed at [facility], [place] [address] [place], [place] [address]  Lipase, blood     Status: None   Collection Time: 09/10/21 12:31 AM  Result Value Ref Range   Lipase 37 11 - 51 U/L    Comment: Performed at [facility], [place] [address] [place], [place] [address]  CBC with Differential     Status: Abnormal   Collection Time: 09/10/21 12:31 AM  Result Value Ref Range   WBC 14.2 (H) 4.0 - 10.5 K/uL   RBC 3.40 (L) 3.87 - 5.11 MIL/uL   Hemoglobin 9.5 (L) 12.0 - 15.0 g/dL   HCT 32.3 (L) 36.0 - 46.0 %   MCV 95.0 80.0 - 100.0 fL   MCH 27.9 26.0 - 34.0 pg   MCHC 29.4 (L) 30.0 - 36.0 g/dL   RDW 18.6 (H) 11.5 - 15.5 %   Platelets 384 150 - 400 K/uL   nRBC 0.1 0.0 - 0.2 %   Neutrophils Relative % 92 %   Neutro Abs 13.1 (H) 1.7 - 7.7 K/uL   Lymphocytes Relative 4 %   Lymphs Abs 0.6 (L) 0.7 - 4.0 K/uL   Monocytes Relative 3 %   Monocytes Absolute 0.4 0.1 - 1.0 K/uL   Eosinophils Relative 1 %   Eosinophils Absolute 0.1 0.0 - 0.5 K/uL   Basophils Relative 0 %   Basophils Absolute 0.0 0.0 - 0.1 K/uL   Immature Granulocytes 0 %   Abs Immature Granulocytes 0.05 0.00 - 0.07 K/uL    Comment: Performed at [facility], [place] [address] [place], [place] [address]  Troponin I (High Sensitivity)     Status: Abnormal   Collection Time: 09/10/21 12:31 AM  Result Value Ref Range   Troponin I (High Sensitivity) 33 (H) <18 ng/L    Comment: (NOTE) Elevated high sensitivity troponin I (hsTnI) values and significant  changes across serial measurements may suggest ACS but many other  chronic and acute conditions are known to elevate hsTnI results.  Refer to the Links section for chest pain algorithms and additional  guidance. Performed at [facility], [place] [address] [place], [place] [address]   I-stat chem 8, ED     Status: Abnormal   Collection Time: 09/10/21 12:40 AM  Result Value Ref Range   Sodium 135 135 - 145 mmol/L   Potassium 3.3 (L) 3.5 - 5.1 mmol/L    Chloride 96 (L)  98 - 111 mmol/L   BUN 48 (H) 6 - 20 mg/dL   Creatinine, Ser 11.20 (H) 0.44 - 1.00 mg/dL   Glucose, Bld 39 (LL) 70 - 99 mg/dL    Comment: Glucose reference range applies only to samples taken after fasting for at least 8 hours.   Calcium, Ion 0.95 (L) 1.15 - 1.40 mmol/L   TCO2 25 22 - 32 mmol/L   Hemoglobin 11.9 (L) 12.0 - 15.0 g/dL   HCT 35.0 (L) 36.0 - 46.0 %   Comment NOTIFIED PHYSICIAN   CBG monitoring, ED     Status: Abnormal   Collection Time: 09/10/21  1:50 AM  Result Value Ref Range   Glucose-Capillary 61 (L) 70 - 99 mg/dL    Comment: Glucose reference range applies only to samples taken after fasting for at least 8 hours.   Comment 1 Notify RN   Troponin I (High Sensitivity)     Status: Abnormal   Collection Time: 09/10/21  3:40 AM  Result Value Ref Range   Troponin I (High Sensitivity) 57 (H) <18 ng/L    Comment: RESULT CALLED TO, READ BACK BY AND VERIFIED WITH: SANGELANG B,RN 09/10/21 0459 WAYK (NOTE) Elevated high sensitivity troponin I (hsTnI) values and significant  changes across serial measurements may suggest ACS but many other  chronic and acute conditions are known to elevate hsTnI results.  Refer to the Links section for chest pain algorithms and additional  guidance. Performed at [facility], [place] [address] [place], [place] [address]   Protime-INR     Status: Abnormal   Collection Time: 09/10/21  4:03 AM  Result Value Ref Range   Prothrombin Time 16.1 (H) 11.4 - 15.2 seconds   INR 1.3 (H) 0.8 - 1.2    Comment: (NOTE) INR goal varies based on device and disease states. Performed at [facility], [place] [address] [place], [place] [address]   CBG monitoring, ED     Status: Abnormal   Collection Time: 09/10/21  4:10 AM  Result Value Ref Range   Glucose-Capillary 132 (H) 70 - 99 mg/dL    Comment: Glucose reference range applies only to samples taken after fasting for at least 8 hours.  Type and screen [place]     Status: None   Collection Time: 09/10/21  5:30 AM  Result Value Ref Range   ABO/RH(D) O POS    Antibody Screen NEG    Sample Expiration      09/13/2021,2359 Performed at [facility], [place] [address], [place], [place] [address]   Lactic acid, plasma     Status: Abnormal   Collection Time: 09/10/21  5:30 AM  Result Value Ref Range   Lactic Acid, Venous 3.7 (HH) 0.5 - 1.9 mmol/L    Comment: CRITICAL RESULT CALLED TO, READ BACK BY AND VERIFIED WITH: Gillis Ends RN 09/10/2021 [id number] DAVISB Performed at [facility], [place] [address], [place], [place] [address]   CBG monitoring, ED     Status: Abnormal   Collection Time: 09/10/21  8:47 AM  Result Value Ref Range   Glucose-Capillary 18 (LL) 70 - 99 mg/dL    Comment: Glucose reference range applies only to samples taken after fasting for at least 8 hours.  Lactic acid, plasma     Status: Abnormal   Collection Time: 09/10/21  9:20 AM  Result Value Ref Range   Lactic Acid, Venous 2.2 (HH) 0.5 - 1.9 mmol/L  Comment: CRITICAL VALUE NOTED.  VALUE IS CONSISTENT WITH PREVIOUSLY REPORTED AND CALLED VALUE. Performed at [facility], [place] [address], [place], [place] [address]   CBC     Status: Abnormal   Collection Time: 09/10/21  9:20 AM  Result Value Ref Range   WBC 36.0 (H) 4.0 - 10.5 K/uL   RBC 3.18 (L) 3.87 - 5.11 MIL/uL   Hemoglobin 8.8 (L) 12.0 - 15.0 g/dL   HCT 30.9 (L) 36.0 - 46.0 %   MCV 97.2 80.0 - 100.0 fL   MCH 27.7 26.0 - 34.0 pg   MCHC 28.5 (L) 30.0 - 36.0 g/dL   RDW 18.6 (H) 11.5 - 15.5 %   Platelets 325 150 - 400 K/uL   nRBC 0.0 0.0 - 0.2 %    Comment: Performed at [facility], [place] [address], [place], [place] [address]  Renal function panel     Status: Abnormal   Collection Time: 09/10/21  9:20 AM  Result Value Ref Range   Sodium 134 (L) 135 - 145 mmol/L   Potassium 3.3 (L) 3.5 - 5.1 mmol/L   Chloride 97 (L) 98 - 111 mmol/L   CO2 19 (L) 22 - 32 mmol/L   Glucose, Bld 75 70 - 99 mg/dL     Comment: Glucose reference range applies only to samples taken after fasting for at least 8 hours.   BUN 54 (H) 6 - 20 mg/dL   Creatinine, Ser 10.70 (H) 0.44 - 1.00 mg/dL   Calcium 8.0 (L) 8.9 - 10.3 mg/dL   Phosphorus 5.5 (H) 2.5 - 4.6 mg/dL   Albumin 2.5 (L) 3.5 - 5.0 g/dL   GFR, Estimated 4 (L) >60 mL/min    Comment: (NOTE) Calculated using the CKD-EPI Creatinine Equation (2021)    Anion gap 18 (H) 5 - 15    Comment: Performed at [facility] [address], [place], [place] [address]  CBG monitoring, ED     Status: Abnormal   Collection Time: 09/10/21  9:28 AM  Result Value Ref Range   Glucose-Capillary 62 (L) 70 - 99 mg/dL    Comment: Glucose reference range applies only to samples taken after fasting for at least 8 hours.  CBG monitoring, ED     Status: Abnormal   Collection Time: 09/10/21  9:57 AM  Result Value Ref Range   Glucose-Capillary 124 (H) 70 - 99 mg/dL    Comment: Glucose reference range applies only to samples taken after fasting for at least 8 hours.  CBG monitoring, ED     Status: Abnormal   Collection Time: 09/10/21 10:50 AM  Result Value Ref Range   Glucose-Capillary 50 (L) 70 - 99 mg/dL    Comment: Glucose reference range applies only to samples taken after fasting for at least 8 hours.  CBG monitoring, ED     Status: Abnormal   Collection Time: 09/10/21 11:25 AM  Result Value Ref Range   Glucose-Capillary 36 (LL) 70 - 99 mg/dL    Comment: Glucose reference range applies only to samples taken after fasting for at least 8 hours.  CBG monitoring, ED     Status: Abnormal   Collection Time: 09/10/21 12:17 PM  Result Value Ref Range   Glucose-Capillary 41 (LL) 70 - 99 mg/dL    Comment: Glucose reference range applies only to samples taken after fasting for at least 8 hours.   Comment 1 Notify RN   CBG monitoring, ED  Status: Abnormal   Collection Time: 09/10/21 12:57 PM  Result Value Ref Range   Glucose-Capillary 139 (H) 70 - 99 mg/dL     Comment: Glucose reference range applies only to samples taken after fasting for at least 8 hours.  Glucose, capillary     Status: Abnormal   Collection Time: 09/10/21  2:20 PM  Result Value Ref Range   Glucose-Capillary 37 (LL) 70 - 99 mg/dL    Comment: Glucose reference range applies only to samples taken after fasting for at least 8 hours.  Glucose, capillary     Status: Abnormal   Collection Time: 09/10/21  3:23 PM  Result Value Ref Range   Glucose-Capillary 17 (LL) 70 - 99 mg/dL    Comment: Glucose reference range applies only to samples taken after fasting for at least 8 hours.   Comment 1 Repeat Test   Glucose, capillary     Status: Abnormal   Collection Time: 09/10/21  3:26 PM  Result Value Ref Range   Glucose-Capillary 39 (LL) 70 - 99 mg/dL    Comment: Glucose reference range applies only to samples taken after fasting for at least 8 hours.  Glucose, capillary     Status: Abnormal   Collection Time: 09/10/21  3:43 PM  Result Value Ref Range   Glucose-Capillary 32 (LL) 70 - 99 mg/dL    Comment: Glucose reference range applies only to samples taken after fasting for at least 8 hours.   Comment 1 Document in Chart   Hepatitis B surface antigen     Status: None   Collection Time: 09/10/21  3:58 PM  Result Value Ref Range   Hepatitis B Surface Ag NON REACTIVE NON REACTIVE    Comment: Performed at [facility] [address] [place], [place] [address]  Hepatitis B core antibody, total     Status: None   Collection Time: 09/10/21  3:58 PM  Result Value Ref Range   Hep B Core Total Ab NON REACTIVE NON REACTIVE    Comment: Performed at [facility] [address], [place], [place] [address]  Hepatitis B surface antibody,qualitative     Status: None   Collection Time: 09/10/21  3:58 PM  Result Value Ref Range   Hep B S Ab NON REACTIVE NON REACTIVE    Comment: (NOTE) Inconsistent with immunity, less than 10 mIU/mL.  Performed at [facility], [place]  [address], [place], [place] [address]   Hepatitis B surface antibody,quantitative     Status: Abnormal   Collection Time: 09/10/21  3:58 PM  Result Value Ref Range   Hepatitis B-Post <3.1 (L) Immunity>9.9 mIU/mL    Comment: (NOTE)  Status of Immunity                     Anti-HBs Level  ------------------                     -------------- Inconsistent with Immunity                   0.0 - 9.9 Consistent with Immunity                          >9.9 Performed At: [facility] [place], [place] [id number] Rush Farmer MD [id number]   Hepatitis C antibody     Status: None   Collection Time: 09/10/21  3:58 PM  Result Value  Ref Range   HCV Ab NON REACTIVE NON REACTIVE    Comment: (NOTE) Nonreactive HCV antibody screen is consistent with no HCV infections,  unless recent infection is suspected or other evidence exists to indicate HCV infection.  Performed at [facility], [place] [address] [place], [place] [address]   Basic metabolic panel     Status: Abnormal   Collection Time: 09/10/21  4:10 PM  Result Value Ref Range   Sodium 135 135 - 145 mmol/L   Potassium 4.1 3.5 - 5.1 mmol/L    Comment: NO VISIBLE HEMOLYSIS   Chloride 94 (L) 98 - 111 mmol/L   CO2 21 (L) 22 - 32 mmol/L   Glucose, Bld <20 (LL) 70 - 99 mg/dL    Comment: RESULT REPEATED AND VERIFIED CRITICAL RESULT CALLED TO, READ BACK BY AND VERIFIED WITH: M.DYBING,RN _0  063/09/2021 [email or web address]    BUN 57 (H) 6 - 20 mg/dL   Creatinine, Ser 10.95 (H) 0.44 - 1.00 mg/dL   Calcium 8.2 (L) 8.9 - 10.3 mg/dL   GFR, Estimated 4 (L) >60 mL/min    Comment: (NOTE) Calculated using the CKD-EPI Creatinine Equation (2021)    Anion gap 20 (H) 5 - 15    Comment: Performed at [facility] [address], [place], [place] [address]  Glucose, capillary     Status: Abnormal   Collection Time: 09/10/21  4:22 PM  Result Value Ref Range   Glucose-Capillary <10 (LL) 70 - 99 mg/dL    Comment: Glucose reference  range applies only to samples taken after fasting for at least 8 hours.  Glucose, capillary     Status: Abnormal   Collection Time: 09/10/21  5:10 PM  Result Value Ref Range   Glucose-Capillary 48 (L) 70 - 99 mg/dL    Comment: Glucose reference range applies only to samples taken after fasting for at least 8 hours.  MRSA Next Gen by PCR, Nasal     Status: None   Collection Time: 09/10/21  5:36 PM   Specimen: Nasal Mucosa; Nasal Swab  Result Value Ref Range   MRSA by PCR Next Gen NOT DETECTED NOT DETECTED    Comment: (NOTE) The GeneXpert MRSA Assay (FDA approved for NASAL specimens only), is one component of a comprehensive MRSA colonization surveillance program. It is not intended to diagnose MRSA infection nor to guide or monitor treatment for MRSA infections. Test performance is not FDA approved in patients less than 49 years old. Performed at [facility], [place] [address], [place], [place] [address]   Glucose, capillary     Status: Abnormal   Collection Time: 09/10/21  6:02 PM  Result Value Ref Range   Glucose-Capillary 47 (L) 70 - 99 mg/dL    Comment: Glucose reference range applies only to samples taken after fasting for at least 8 hours.  Glucose, capillary     Status: Abnormal   Collection Time: 09/10/21  7:44 PM  Result Value Ref Range   Glucose-Capillary 119 (H) 70 - 99 mg/dL    Comment: Glucose reference range applies only to samples taken after fasting for at least 8 hours.  Heparin level (unfractionated)     Status: Abnormal   Collection Time: 09/10/21  7:45 PM  Result Value Ref Range   Heparin Unfractionated <0.10 (L) 0.30 - 0.70 IU/mL    Comment: (NOTE) The clinical reportable range upper limit is being lowered to >1.10 to align with the FDA approved guidance for the current laboratory  assay.  If heparin results are below expected values, and patient dosage has  been confirmed, suggest follow up testing of antithrombin III levels. Performed at [facility], [place] [address], [place], [place] [address]   Glucose, capillary     Status: None   Collection Time: 09/10/21 10:20 PM  Result Value Ref Range   Glucose-Capillary 93 70 - 99 mg/dL    Comment: Glucose reference range applies only to samples taken after fasting for at least 8 hours.  Glucose, capillary     Status: None   Collection Time: 09/10/21 11:57 PM  Result Value Ref Range   Glucose-Capillary 82 70 - 99 mg/dL    Comment: Glucose reference range applies only to samples taken after fasting for at least 8 hours.  Glucose, capillary     Status: Abnormal   Collection Time: 09/11/21  2:13 AM  Result Value Ref Range   Glucose-Capillary 60 (L) 70 - 99 mg/dL    Comment: Glucose reference range applies only to samples taken after fasting for at least 8 hours.  Magnesium     Status: None   Collection Time: 09/11/21  3:24 AM  Result Value Ref Range   Magnesium 1.8 1.7 - 2.4 mg/dL    Comment: Performed at [facility] [address] [place], [place] [address]  CBC with Differential/Platelet     Status: Abnormal   Collection Time: 09/11/21  3:24 AM  Result Value Ref Range   WBC 44.3 (H) 4.0 - 10.5 K/uL   RBC 3.23 (L) 3.87 - 5.11 MIL/uL   Hemoglobin 9.0 (L) 12.0 - 15.0 g/dL   HCT 30.4 (L) 36.0 - 46.0 %   MCV 94.1 80.0 - 100.0 fL   MCH 27.9 26.0 - 34.0 pg   MCHC 29.6 (L) 30.0 - 36.0 g/dL   RDW 18.9 (H) 11.5 - 15.5 %   Platelets 328 150 - 400 K/uL   nRBC 0.0 0.0 - 0.2 %   Neutrophils Relative % 89 %   Neutro Abs 39.5 (H) 1.7 - 7.7 K/uL   Lymphocytes Relative 2 %   Lymphs Abs 0.8 0.7 - 4.0 K/uL   Monocytes Relative 5 %   Monocytes Absolute 2.0 (H) 0.1 - 1.0 K/uL   Eosinophils Relative 0 %   Eosinophils Absolute 0.0 0.0 - 0.5 K/uL   Basophils Relative 0 %   Basophils Absolute 0.1 0.0 - 0.1 K/uL   Immature Granulocytes 4 %   Abs Immature Granulocytes 1.96 (H) 0.00 - 0.07 K/uL    Comment: Performed at [facility] [address], [place], [place] [address]   Comprehensive metabolic panel     Status: Abnormal   Collection Time: 09/11/21  3:24 AM  Result Value Ref Range   Sodium 132 (L) 135 - 145 mmol/L   Potassium 4.4 3.5 - 5.1 mmol/L   Chloride 94 (L) 98 - 111 mmol/L   CO2 22 22 - 32 mmol/L   Glucose, Bld 132 (H) 70 - 99 mg/dL    Comment: Glucose reference range applies only to samples taken after fasting for at least 8 hours.   BUN 60 (H) 6 - 20 mg/dL   Creatinine, Ser 11.23 (H) 0.44 - 1.00 mg/dL   Calcium 7.9 (L) 8.9 - 10.3 mg/dL   Total Protein 7.1 6.5 - 8.1 g/dL   Albumin 2.4 (L) 3.5 - 5.0 g/dL   AST 30 15 - 41 U/L   ALT 11 0 - 44  U/L   Alkaline Phosphatase 76 38 - 126 U/L   Total Bilirubin 0.8 0.3 - 1.2 mg/dL   GFR, Estimated 4 (L) >60 mL/min    Comment: (NOTE) Calculated using the CKD-EPI Creatinine Equation (2021)    Anion gap 16 (H) 5 - 15    Comment: Performed at [facility] [address] [place], [place] [address]  Heparin level (unfractionated)     Status: Abnormal   Collection Time: 09/11/21  3:24 AM  Result Value Ref Range   Heparin Unfractionated 0.86 (H) 0.30 - 0.70 IU/mL    Comment: (NOTE) The clinical reportable range upper limit is being lowered to >1.10 to align with the FDA approved guidance for the current laboratory assay.  If heparin results are below expected values, and patient dosage has  been confirmed, suggest follow up testing of antithrombin III levels. Performed at [facility], [place] [address], [place], [place] [address]   Glucose, capillary     Status: Abnormal   Collection Time: 09/11/21  3:24 AM  Result Value Ref Range   Glucose-Capillary 139 (H) 70 - 99 mg/dL    Comment: Glucose reference range applies only to samples taken after fasting for at least 8 hours.  Glucose, capillary     Status: Abnormal   Collection Time: 09/11/21  5:45 AM  Result Value Ref Range   Glucose-Capillary 103 (H) 70 - 99 mg/dL    Comment: Glucose reference range applies only to samples taken after  fasting for at least 8 hours.  Glucose, capillary     Status: Abnormal   Collection Time: 09/11/21  8:13 AM  Result Value Ref Range   Glucose-Capillary 132 (H) 70 - 99 mg/dL    Comment: Glucose reference range applies only to samples taken after fasting for at least 8 hours.    CT Abdomen Pelvis Wo Contrast  Result Date: 09/10/2021 CLINICAL DATA:  Abdominal pain EXAM: CT ABDOMEN AND PELVIS WITHOUT CONTRAST TECHNIQUE: Multidetector CT imaging of the abdomen and pelvis was performed following the standard protocol without IV contrast. RADIATION DOSE REDUCTION: This exam was performed according to the departmental dose-optimization program which includes automated exposure control, adjustment of the mA and/or kV according to patient size and/or use of iterative reconstruction technique. COMPARISON:  05/19/2021 FINDINGS: Lower chest: Mild basilar atelectasis on the left is noted. No sizable effusion is seen. Cardiac enlargement is again noted with dialysis catheter in place. Hepatobiliary: No focal liver abnormality is seen. No gallstones, gallbladder wall thickening, or biliary dilatation. Pancreas: Unremarkable. No pancreatic ductal dilatation or surrounding inflammatory changes. Spleen: Spleen demonstrates decreased attenuation within the substance of the spleen as well as indistinctness along the superior and lateral contour of the spleen. This is of uncertain significance although the possibility of an underlying splenic injury could not be totally excluded. Adrenals/Urinary Tract: Adrenal glands are within normal limits. The kidneys are atrophic consistent with the known end-stage renal disease. Obstructive changes are seen. The bladder is decompressed. Stomach/Bowel: No obstructive or inflammatory changes of the colon are seen. The appendix is within normal limits. Small bowel and stomach are unremarkable. Vascular/Lymphatic: Diffuse vascular calcifications are seen without acute abnormality. No  aneurysmal dilatation is noted. Reproductive: Calcifications are noted within the uterus related to arterial calcifications. No adnexal mass is seen. Other: No abdominal wall hernia or abnormality. No abdominopelvic ascites. Musculoskeletal: No acute or significant osseous findings. IMPRESSION: Indistinctness along the margin of the spleen as described. This is of uncertain significance  although the possibility of a splenic hemorrhage or injury could not be totally excluded. Repeat CT of the abdomen with contrast is recommended for further evaluation. Basilar atelectasis on the left. No findings to suggest bowel obstruction are seen. Changes consistent with end-stage renal disease. Critical Value/emergent results were called by telephone at the time of interpretation on 09/10/2021 at 1:55 am to Dr. Quintella Reichert , who verbally acknowledged these results. Electronically Signed   By: Inez Catalina M.D.   On: 09/10/2021 02:10   CT ABDOMEN W CONTRAST  Result Date: 09/10/2021 CLINICAL DATA:  Nausea, vomiting, and abdominal pain. Abnormal spleen. EXAM: CT ABDOMEN WITH CONTRAST TECHNIQUE: Multidetector CT imaging of the abdomen was performed using the standard protocol following bolus administration of intravenous contrast. RADIATION DOSE REDUCTION: This exam was performed according to the departmental dose-optimization program which includes automated exposure control, adjustment of the mA and/or kV according to patient size and/or use of iterative reconstruction technique. CONTRAST:  183m OMNIPAQUE IOHEXOL 300 MG/ML  SOLN COMPARISON:  09/10/2021 FINDINGS: Lower chest: Heart is enlarged and coronary artery calcifications are noted. The distal tip of a central venous catheter terminates in the right atrium. There is a small left pleural effusion with atelectasis or infiltrate at the left lung base. Mild atelectasis is noted at the right lung base. Hepatobiliary: No focal liver abnormality is seen. No gallstones,  gallbladder wall thickening, or biliary dilatation. Pancreas: Unremarkable. No pancreatic ductal dilatation or surrounding inflammatory changes. Spleen: Spleen is normal in size. There are linear hypodense bands in the upper and lower spleen with a trace amount of perisplenic free fluid. Adrenals/Urinary Tract: No adrenal nodule or mass. Renal atrophy is noted bilaterally. Vascular calcifications are noted bilaterally. No hydroureteronephrosis bilaterally. Stomach/Bowel: Stomach is within normal limits. The appendix is excluded from the field of view. No evidence of bowel wall thickening, distention, or inflammatory changes. Vascular/Lymphatic: Aortic atherosclerosis. No enlarged abdominal or pelvic lymph nodes. Other: Trace amount of free fluid in the perisplenic space. Musculoskeletal: Degenerative changes in the thoracolumbar spine. No acute osseous abnormality. IMPRESSION: 1. Hypoenhancing bands in the spleen with a small amount of perisplenic free fluid, suggesting infarcts. Laceration or rupture could also be considered if there is a history of trauma. 2. Trace left pleural effusion with atelectasis or infiltrate. 3. Bilateral renal atrophy. 4. Aortic atherosclerosis. Electronically Signed   By: LBrett FairyM.D.   On: 09/10/2021 03:25   IR Veno/Jugular Right  Result Date: 09/11/2021 INDICATION: 51year old female with history of end-stage renal disease, currently with poor venous access. EXAM: 1. Ultrasound-guided vascular access of the right internal jugular vein. 2. Limited right central venogram. 3. Ultrasound-guided vascular access of the right basilic vein. 4. Placement of right upper extremity midline. MEDICATIONS: None. CONTRAST:  5 mL Omnipaque 300, intravenous FLUOROSCOPY TIME:  6 minutes, 36 seconds, 870.9mGy COMPLICATIONS: None immediate. TECHNIQUE: The procedure, risks, benefits, and alternatives were explained to the patient and informed written consent was obtained. A timeout was performed  prior to the initiation of the procedure. The right neck was prepped and draped in standard fashion. Preprocedure ultrasound evaluation demonstrated partially occluded right internal jugular vein with indwelling right internal jugular vein tunneled hemodialysis catheter. Subdermal Local anesthesia was administered at the planned needle entry site with 1% lidocaine. A small skin nick was made. Under direct ultrasound visualization, a 21 gauge micropuncture was used to puncture the right internal jugular vein. A permanent ultrasound image was captured and stored in the record. A 0.018 inch  wire was inserted in met resistance. Under fluoroscopic guidance, the wire was attempted to be navigated around the indwelling hemodialysis catheter which was unsuccessful. 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 The needle was removed and hemostasis was achieved with manual compression. The left neck was examined under ultrasound which demonstrated an occluded left internal jugular vein. There was a patent vein laterally, possibly representing the external jugular vein, however this vein was not visualized clearly communicating with the central veins. Therefore, the decision was made to place right upper extremity midline. This is the same extremity of the abandoned arteriovenous fistula. The right upper extremity was prepped with chlorhexidine in a sterile fashion, and a sterile drape was applied covering the operative field. Maximum barrier sterile technique with sterile gowns and gloves were used for the procedure. A timeout was performed prior to the initiation of the procedure. Local anesthesia was provided with 1% lidocaine. Under direct ultrasound guidance, the basilic vein was accessed with a micropuncture kit after the overlying soft tissues were anesthetized with 1%  lidocaine. After the overlying soft tissues were anesthetized, a small venotomy incision was created and a micropuncture kit was utilized to access the right basilic vein. Real-time ultrasound guidance was utilized for vascular access including the acquisition of a permanent ultrasound image documenting patency of the accessed vessel. A guidewire was advanced to the level of the axillary vein. The PICC line was cut to 15 cm. A peel-away sheath was placed and a cm, 5 [place], dual lumen was inserted to level of the axillary. A post procedure spot fluoroscopic was obtained. The catheter easily aspirated and flushed and was secured in place with stat lock device. A dressing was applied. The patient tolerated the procedure well without immediate post procedural complication. FINDINGS: Occluded bilateral internal jugular veins. After catheter placement, the tip lies within the right axillary vein the catheter aspirates and flushes normally and is ready for immediate use. IMPRESSION: Successful ultrasound and fluoroscopic guided placement of a right basilic vein approach, 15 cm, 5 [place], dual lumen midline with tip in the right axillary vein. The line is ready for immediate use. Ruthann Cancer, MD [facility] Radiology Electronically Signed   By: Ruthann Cancer M.D.   On: 09/11/2021 08:39   IR US Guide Vasc Access Right  Result Date: 09/11/2021 INDICATION: 51 year old female with history of end-stage renal disease, currently with poor venous access. EXAM: 1. Ultrasound-guided vascular access of the right internal jugular vein. 2. Limited right central venogram. 3. Ultrasound-guided vascular access of the right basilic vein. 4. Placement of right upper extremity midline. MEDICATIONS: None. CONTRAST:  5 mL Omnipaque 300, intravenous FLUOROSCOPY TIME:  6 minutes, 36 seconds, 41.6 mGy COMPLICATIONS: None immediate. TECHNIQUE: The procedure, risks, benefits, and alternatives  were explained to the patient and informed written consent was obtained. A timeout was performed prior to the initiation of the procedure. The right neck was prepped and draped in standard fashion. Preprocedure ultrasound evaluation demonstrated partially occluded right internal jugular vein with indwelling right internal jugular vein tunneled hemodialysis catheter. Subdermal Local anesthesia was administered at the planned needle entry site with 1% lidocaine. A small skin nick was made. Under direct ultrasound visualization, a 21 gauge micropuncture was used to puncture the right internal jugular vein. A permanent ultrasound image was captured and stored in the record. A 0.018 inch wire was inserted in met  resistance. Under fluoroscopic guidance, the wire was attempted to be navigated around the indwelling hemodialysis catheter which was unsuccessful. 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 The needle was removed and hemostasis was achieved with manual compression. The left neck was examined under ultrasound which demonstrated an occluded left internal jugular vein. There was a patent vein laterally, possibly representing the external jugular vein, however this vein was not visualized clearly communicating with the central veins. Therefore, the decision was made to place right upper extremity midline. This is the same extremity of the abandoned arteriovenous fistula. The right upper extremity was prepped with chlorhexidine in a sterile fashion, and a sterile drape was applied covering the operative field. Maximum barrier sterile technique with sterile gowns and gloves were used for the procedure. A timeout was performed prior to the initiation of the procedure. Local anesthesia was provided with 1% lidocaine. Under direct ultrasound guidance, the basilic vein was  accessed with a micropuncture kit after the overlying soft tissues were anesthetized with 1% lidocaine. After the overlying soft tissues were anesthetized, a small venotomy incision was created and a micropuncture kit was utilized to access the right basilic vein. Real-time ultrasound guidance was utilized for vascular access including the acquisition of a permanent ultrasound image documenting patency of the accessed vessel. A guidewire was advanced to the level of the axillary vein. The PICC line was cut to 15 cm. A peel-away sheath was placed and a cm, 5 [place], dual lumen was inserted to level of the axillary. A post procedure spot fluoroscopic was obtained. The catheter easily aspirated and flushed and was secured in place with stat lock device. A dressing was applied. The patient tolerated the procedure well without immediate post procedural complication. FINDINGS: Occluded bilateral internal jugular veins. After catheter placement, the tip lies within the right axillary vein the catheter aspirates and flushes normally and is ready for immediate use. IMPRESSION: Successful ultrasound and fluoroscopic guided placement of a right basilic vein approach, 15 cm, 5 [place], dual lumen midline with tip in the right axillary vein. The line is ready for immediate use. Ruthann Cancer, MD [facility] Radiology Electronically Signed   By: Ruthann Cancer M.D.   On: 09/11/2021 08:39   DG Chest Port 1 View  Result Date: 09/11/2021 CLINICAL DATA:  Acute respiratory difficulty EXAM: PORTABLE CHEST 1 VIEW COMPARISON:  09/10/2021 FINDINGS: Cardiac shadow is enlarged but stable. Gastric catheter is noted extending into the stomach. Right jugular dialysis catheter is again seen and stable. The lungs show mild left basilar atelectasis. No pneumothorax is seen. The recently placed right midline catheter is not appreciated on this exam. IMPRESSION: Mild left basilar atelectasis.  Electronically Signed   By: Inez Catalina M.D.   On: 09/11/2021 03:05   DG Chest Port 1 View  Result Date: 09/10/2021 CLINICAL DATA:  Shortness of breath and cough. EXAM: PORTABLE CHEST 1 VIEW COMPARISON:  Sep 03, 2021 FINDINGS: There is stable right-sided venous catheter positioning. There is stable moderate to marked severity enlargement of the cardiac silhouette. There is no evidence of acute infiltrate, pleural effusion or pneumothorax the visualized skeletal structures are unremarkable. IMPRESSION: Stable cardiomegaly without evidence of acute or active cardiopulmonary disease. Electronically Signed   By: Virgina Norfolk M.D.   On: 09/10/2021 01:16   DG Abd Portable 1V  Result Date: 09/10/2021 CLINICAL DATA:  [id number].  Feeding  tube placement. EXAM: PORTABLE ABDOMEN - 1 VIEW COMPARISON:  None Available. FINDINGS: Enteric tube coursing below the hemidiaphragm with side port overlying the expected region of the gastric lumen and tip collimated off view. Partially visualized dialysis catheter with tip overlying the right atrium. Visualized lungs demonstrate low lung volumes with airspace opacity-please see separately dictated chest x-ray 09/10/2021 and CT abdomen pelvis 09/10/2021. The visualized bowel gas pattern is normal. No radio-opaque calculi or other significant radiographic abnormality are seen. Atherosclerotic plaque. IMPRESSION: 1. Enteric tube in good position with tip collimated off view. 2.  Aortic Atherosclerosis (ICD10-I70.0). Electronically Signed   By: Iven Finn M.D.   On: 09/10/2021 18:19   ECHOCARDIOGRAM COMPLETE  Result Date: 09/10/2021    ECHOCARDIOGRAM REPORT   Patient Name:   Courtney Gray Date of Exam: 09/10/2021 Medical Rec #:  [id number]       Height:       65.0 in Accession #:    [id number]      Weight:       332.0 lb Date of Birth:  [birth year]        BSA:          2.454 m Patient Age:    64 years        BP:           79/35 mmHg Patient Gender: F               HR:            72 bpm. Exam Location:  Inpatient Procedure: 2D Echo, Cardiac Doppler and Color Doppler Indications:    check for thrombus  History:        Patient has prior history of Echocardiogram examinations, most                 recent 05/17/2021. Stroke; Risk Factors:Diabetes, Dyslipidemia                 and Hypertension. Cath2/14/23.  Sonographer:    Luisa Hart RDCS Referring Phys: [id number] [place]  Sonographer Comments: Technically difficult study due to poor echo windows and patient is morbidly obese. Image acquisition challenging due to patient body habitus. IMPRESSIONS  1. Poor quality study. Globally LV function appears normal.  2. Left ventricular ejection fraction, by estimation, is 50 to 55%. The left ventricle has low normal function. Left ventricular endocardial border not optimally defined to evaluate regional wall motion. There is moderate concentric left ventricular hypertrophy. Left ventricular diastolic parameters are consistent with Grade II diastolic dysfunction (pseudonormalization).  3. Right ventricular systolic function was not well visualized. The right ventricular size is not well visualized. There is mildly elevated pulmonary artery systolic pressure.  4. Left atrial size was mild to moderately dilated.  5. The mitral valve is degenerative. Trivial mitral valve regurgitation. No evidence of mitral stenosis.  6. Tricuspid valve regurgitation is mild to moderate.  7. The aortic valve is tricuspid. Aortic valve regurgitation is not visualized. Mild aortic valve stenosis.  8. The inferior vena cava is normal in size with <50% respiratory variability, suggesting right atrial pressure of 8 mmHg. Comparison(s): No significant change from prior study. FINDINGS  Left Ventricle: Left ventricular ejection fraction, by estimation, is 50 to 55%. The left ventricle has low normal function. Left ventricular endocardial border not optimally defined to evaluate regional wall motion. The left ventricular  internal cavity  size was normal in size. There is moderate concentric left ventricular hypertrophy. Left ventricular diastolic parameters  are consistent with Grade II diastolic dysfunction (pseudonormalization). Right Ventricle: The right ventricular size is not well visualized. Right vetricular wall thickness was not well visualized. Right ventricular systolic function was not well visualized. There is mildly elevated pulmonary artery systolic pressure. The tricuspid regurgitant velocity is 2.79 m/s, and with an assumed right atrial pressure of 8 mmHg, the estimated right ventricular systolic pressure is 50.5 mmHg. Left Atrium: Left atrial size was mild to moderately dilated. Right Atrium: Right atrial size was not well visualized. Pericardium: Trivial pericardial effusion is present. Mitral Valve: The mitral valve is degenerative in appearance. Mild to moderate mitral annular calcification. Trivial mitral valve regurgitation. No evidence of mitral valve stenosis. MV peak gradient, 4.2 mmHg. The mean mitral valve gradient is 2.0 mmHg. Tricuspid Valve: The tricuspid valve is grossly normal. Tricuspid valve regurgitation is mild to moderate. No evidence of tricuspid stenosis. Aortic Valve: The aortic valve is tricuspid. Aortic valve regurgitation is not visualized. Mild aortic stenosis is present. Aortic valve mean gradient measures 12.0 mmHg. Aortic valve peak gradient measures 22.9 mmHg. Aortic valve area, by VTI measures 0.80 cm. Pulmonic Valve: The pulmonic valve was grossly normal. Pulmonic valve regurgitation is not visualized. No evidence of pulmonic stenosis. Aorta: The aortic root and ascending aorta are structurally normal, with no evidence of dilitation. Venous: The inferior vena cava is normal in size with less than 50% respiratory variability, suggesting right atrial pressure of 8 mmHg. IAS/Shunts: The interatrial septum was not well visualized.  LEFT VENTRICLE PLAX 2D LVIDd:         4.85 cm    Diastology LVIDs:         3.90 cm   LV e' medial:    6.20 cm/s LV PW:         1.40 cm   LV E/e' medial:  15.6 LV IVS:        1.55 cm   LV e' lateral:   7.33 cm/s LVOT diam:     1.80 cm   LV E/e' lateral: 13.2 LV SV:         34 LV SV Index:   14 LVOT Area:     2.54 cm  RIGHT VENTRICLE RV S prime:     12.60 cm/s TAPSE (M-mode): 2.4 cm LEFT ATRIUM              Index LA diam:        3.60 cm  1.47 cm/m LA Vol (A2C):   77.6 ml  31.63 ml/m LA Vol (A4C):   116.0 ml 47.28 ml/m LA Biplane Vol: 96.2 ml  39.21 ml/m  AORTIC VALVE                     PULMONIC VALVE AV Area (Vmax):    0.81 cm      PV Vmax:       0.94 m/s AV Area (Vmean):   0.77 cm      PV Vmean:      64.000 cm/s AV Area (VTI):     0.80 cm      PV VTI:        0.187 m AV Vmax:           239.50 cm/s   PV Peak grad:  3.5 mmHg AV Vmean:          160.500 cm/s  PV Mean grad:  2.0 mmHg AV VTI:            0.424 m AV Peak  Grad:      22.9 mmHg AV Mean Grad:      12.0 mmHg LVOT Vmax:         76.00 cm/s LVOT Vmean:        48.500 cm/s LVOT VTI:          0.134 m LVOT/AV VTI ratio: 0.32  AORTA Ao Root diam: 3.00 cm Ao Asc diam:  3.30 cm MITRAL VALVE               TRICUSPID VALVE MV Area (PHT): 2.96 cm    TR Peak grad:   31.1 mmHg MV Area VTI:   0.99 cm    TR Vmax:        279.00 cm/s MV Peak grad:  4.2 mmHg MV Mean grad:  2.0 mmHg    SHUNTS MV Vmax:       1.03 m/s    Systemic VTI:  0.13 m MV Vmean:      74.7 cm/s   Systemic Diam: 1.80 cm MV Decel Time: 256 msec MR Peak grad: 61.3 mmHg MR Mean grad: 38.7 mmHg MR Vmax:      391.33 cm/s MR Vmean:     292.0 cm/s MV E velocity: 96.50 cm/s MV A velocity: 77.10 cm/s MV E/A ratio:  1.25 Eleonore Chiquito MD Electronically signed by Eleonore Chiquito MD Signature Date/Time: 09/10/2021/3:23:32 PM    Final    IR PICC PLACEMENT RIGHT >5 YRS INC IMG GUIDE  Result Date: 09/11/2021 INDICATION: 51 year old female with history of end-stage renal disease, currently with poor venous access. EXAM: 1. Ultrasound-guided vascular access of the right  internal jugular vein. 2. Limited right central venogram. 3. Ultrasound-guided vascular access of the right basilic vein. 4. Placement of right upper extremity midline. MEDICATIONS: None. CONTRAST:  5 mL Omnipaque 300, intravenous FLUOROSCOPY TIME:  6 minutes, 36 seconds, 95.2 mGy COMPLICATIONS: None immediate. TECHNIQUE: The procedure, risks, benefits, and alternatives were explained to the patient and informed written consent was obtained. A timeout was performed prior to the initiation of the procedure. The right neck was prepped and draped in standard fashion. Preprocedure ultrasound evaluation demonstrated partially occluded right internal jugular vein with indwelling right internal jugular vein tunneled hemodialysis catheter. Subdermal Local anesthesia was administered at the planned needle entry site with 1% lidocaine. A small skin nick was made. Under direct ultrasound visualization, a 21 gauge micropuncture was used to puncture the right internal jugular vein. A permanent ultrasound image was captured and stored in the record. A 0.018 inch wire was inserted in met resistance. Under fluoroscopic guidance, the wire was attempted to be navigated around the indwelling hemodialysis catheter which was unsuccessful. 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 The needle was removed and hemostasis was achieved with manual compression. The left neck was examined under ultrasound which demonstrated an occluded left internal jugular vein. There was a patent vein laterally, possibly representing the external jugular vein, however this vein was not visualized clearly communicating with the central veins. Therefore, the decision was made to place right upper extremity midline. This is the same extremity of the abandoned arteriovenous fistula. The right upper extremity was  prepped with chlorhexidine in a sterile fashion, and a sterile drape was applied covering the operative field. Maximum barrier sterile technique with sterile gowns and gloves were used for the procedure. A timeout was performed prior to the initiation of the procedure. Local anesthesia  was provided with 1% lidocaine. Under direct ultrasound guidance, the basilic vein was accessed with a micropuncture kit after the overlying soft tissues were anesthetized with 1% lidocaine. After the overlying soft tissues were anesthetized, a small venotomy incision was created and a micropuncture kit was utilized to access the right basilic vein. Real-time ultrasound guidance was utilized for vascular access including the acquisition of a permanent ultrasound image documenting patency of the accessed vessel. A guidewire was advanced to the level of the axillary vein. The PICC line was cut to 15 cm. A peel-away sheath was placed and a cm, 5 [place], dual lumen was inserted to level of the axillary. A post procedure spot fluoroscopic was obtained. The catheter easily aspirated and flushed and was secured in place with stat lock device. A dressing was applied. The patient tolerated the procedure well without immediate post procedural complication. FINDINGS: Occluded bilateral internal jugular veins. After catheter placement, the tip lies within the right axillary vein the catheter aspirates and flushes normally and is ready for immediate use. IMPRESSION: Successful ultrasound and fluoroscopic guided placement of a right basilic vein approach, 15 cm, 5 [place], dual lumen midline with tip in the right axillary vein. The line is ready for immediate use. Ruthann Cancer, MD [facility] Radiology Electronically Signed   By: Ruthann Cancer M.D.   On: 09/11/2021 08:39    Review of Systems  HENT:  Negative for ear discharge, ear pain, hearing loss and tinnitus.   Eyes:  Negative for photophobia  and pain.  Respiratory:  Negative for cough and shortness of breath.   Cardiovascular:  Negative for chest pain.  Gastrointestinal:  Negative for abdominal pain, nausea and vomiting.  Genitourinary:  Negative for dysuria, flank pain, frequency and urgency.  Musculoskeletal:  Positive for arthralgias (Left long finger). Negative for back pain, myalgias and neck pain.  Neurological:  Negative for dizziness and headaches.  Hematological:  Does not bruise/bleed easily.  Psychiatric/Behavioral:  The patient is not nervous/anxious.   Blood pressure (!) 135/58, pulse 73, temperature 98.3 F (36.8 C), temperature source Axillary, resp. rate 14, height _0  (1.651 m), weight (!) 151.3 kg, SpO2 100 %. Physical Exam Constitutional:      General: She is not in acute distress.    Appearance: She is well-developed. She is not diaphoretic.  HENT:     Head: Normocephalic and atraumatic.  Eyes:     General: No scleral icterus.       Right eye: No discharge.        Left eye: No discharge.     Conjunctiva/sclera: Conjunctivae normal.  Cardiovascular:     Rate and Rhythm: Normal rate and regular rhythm.  Pulmonary:     Effort: Pulmonary effort is normal. No respiratory distress.  Musculoskeletal:     Cervical back: Normal range of motion.     Comments: Left shoulder, elbow, wrist, digits- Ischemic appearing long finger with infarcted area volar tip, absent sensation or cap refill distal to DIP joint, coloration changes P2 but soft with intact sensation, P1 essentially spared, mod TTP P3, no instability, no blocks to motion  Sens  Ax/R/M/U intact  Mot   Ax/ R/ PIN/ M/ AIN/ U intact  Rad 0 but monophasic doppler signal present  Skin:    General: Skin is warm and dry.  Neurological:     Mental Status: She is alert.  Psychiatric:        Mood and Affect: Mood normal.  Behavior: Behavior normal.    Assessment/Plan: Left long finger ischemia -- As finger continues to evolve would hold off on  surgery for now. Will need at least tip amputation in the future. Advise treat pain including gabapentin or similar for the neuropathic component. Will also try some voltaren gel. Will update x-rays. F/u with Dr. Tempie Donning on discharge.    Lisette Abu, PA-C Orthopedic Surgery [phone] 09/11/2021, 9:36 AM   Pt seen and examined independently.  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  Has been evolving over the last several months.  No TTP or discoloration of finger proximal to middle aspect of middle phalanx.  Will plan on elective amputation after extent of necrosis declares itself.  Patient is agreeable and all questions answered.  Please let me know if her exam or the condition of her finger changes acutely during her inpatient stay.   Sherilyn Cooter, M.D. [place]

## 2021-09-11 NOTE — Progress Notes (Signed)
Patient ID: Courtney Gray, female   DOB: April 29, 1970, 51 y.o.   MRN: [id number] [facility] Progress Note   Assessment/ Plan:   1.  Abdominal pain/splenic infarct: Ongoing source identification of embolic source contributing to her splenic infarct with ongoing pain control and on heparin drip. 2. ESRD: She is usually on a TTS dialysis schedule and could not undertake hemodialysis yesterday due to hypotension and the need to access dialysis catheter for Neo-Synephrine.  At this time, labs are reviewed and patient examined.  There is no critical dialysis needs based on her electrolytes or volume status and we will continue to follow her closely with efforts of trying to wean her off of pressors.  If remains on pressors and indications emerge, she will need CRRT. 3. Anemia: Low hemoglobin/hematocrit noted, will continue ESA 4. CKD-MBD: Resume phosphorus binders when back on reliable oral intake/diet.  Calcium level acceptable when corrected for albumin.  On Hectorol for PTH suppression. 5.  Peripheral vascular disease status post right above-knee amputation: She had amputation earlier last month by vascular surgery and wound stump appears to be clean/dry with intact staples.  Plans noted by vascular surgery for staple removal as an outpatient. 6.  Fever/hypotension/leukocytosis: Currently suspected to be associated with her splenic infarct and has been evaluated by vascular surgery for integrity of recent amputation stump.  On antimicrobial coverage with cefepime and vancomycin and currently getting Neo-Synephrine drip.  Dialysis catheter after dislodgment of midline PICC line.   Subjective:   Reports some improvement of abdominal discomfort and complains of poor appetite/nausea.  Skin peeling over hands-suspected to be "reaction to doxycycline".   Objective:   BP (!) 136/97   Pulse 85   Temp 98.3 F (36.8 C) (Axillary)   Resp (!) 22   Ht 5\' 5"  (1.651 m)   Wt (!) 151.3 kg   SpO2 100%    BMI 55.51 kg/m   Physical Exam: Gen: Comfortably resting in bed, awake/alert, NGT in situ CVS: Pulse regular rhythm, normal rate, S1 and S2 normal Resp: Diminished breath sounds over bases, poor inspiratory effort.  No distinct rales or rhonchi.  Right IJ TDC with Neo-Synephrine infusion Abd: Soft, obese, tender over upper quadrants Ext: Status post right above-knee amputation with intact staples over stump, 1+ left lower extremity edema with duskiness of toes noted.  Labs: BMET Recent Labs  Lab 09/10/21 0031 09/10/21 0040 09/10/21 0920 09/10/21 1610 09/11/21 0324  NA 135 135 134* 135 132*  K 3.4* 3.3* 3.3* 4.1 4.4  CL 95* 96* 97* 94* 94*  CO2 24  --  19* 21* 22  GLUCOSE 42* 39* 75 <20* 132*  BUN 49* 48* 54* 57* 60*  CREATININE 10.19* 11.20* 10.70* 10.95* 11.23*  CALCIUM 8.4*  --  8.0* 8.2* 7.9*  PHOS  --   --  5.5*  --   --    CBC Recent Labs  Lab 09/10/21 0031 09/10/21 0040 09/10/21 0920 09/11/21 0324  WBC 14.2*  --  36.0* 44.3*  NEUTROABS 13.1*  --   --  39.5*  HGB 9.5* 11.9* 8.8* 9.0*  HCT 32.3* 35.0* 30.9* 30.4*  MCV 95.0  --  97.2 94.1  PLT 384  --  325 328      Medications:     atorvastatin  10 mg Oral QHS   [START ON 09/12/2021] calcitRIOL  1 mcg Oral Q T,Th,Sa-HD   calcium carbonate  1,500 mg Oral TID with meals   Chlorhexidine Gluconate Cloth  6 each  Topical Q0600   [START ON 09/12/2021] cinacalcet  30 mg Oral Q T,Th,Sa-HD   [START ON 09/12/2021] darbepoetin (ARANESP) injection - DIALYSIS  200 mcg Intravenous Q Thu-HD   gabapentin  100 mg Oral BID   gabapentin  200 mg Oral Q T,Th,Sa-HD   lactulose  30 g Oral TID   midodrine  10 mg Oral Q8H   pantoprazole  40 mg Oral Daily   polyethylene glycol  17 g Oral Daily   senna-docusate  1 tablet Oral BID   sodium chloride flush  10-40 mL Intracatheter Q12H   sodium chloride flush  3 mL Intravenous Q12H   Elmarie Shiley, MD 09/11/2021, 8:35 AM

## 2021-09-11 NOTE — Progress Notes (Signed)
[place] Progress Note Patient Name: Courtney Gray DOB: Jun 07, 1970 MRN: [id number]   Date of Service  09/11/2021  HPI/Events of Note  Patient's midline was pulled proximally by 2 inches .  eICU Interventions  Stat portable CXR to document  current position of the midline, will probably require central line placement.         Kerry Kass Desera Graffeo 09/11/2021, 2:36 AM

## 2021-09-11 NOTE — Progress Notes (Addendum)
NAME:  Courtney Gray, MRN:  [id number], DOB:  [birth year], LOS: 1 ADMISSION DATE:  09/09/2021, CONSULTATION DATE:  6/6 REFERRING MD:  Dr. Pietro Cassis, CHIEF COMPLAINT:  Abdominal Pain    History of Present Illness:  51 y/o F who presented to [facility] ER on 6/5 with reports of abdominal pain, constipation and decreased appetite.    She was admitted from 4/29-5/24 with initial complaints of right foot pain.  She was found to have an ischemic foot with concern for gangrene.  Patient underwent a right AKA.  Additional findings during that hospitalization included an ulceration on the left middle finger and left second toe pain with concern for compromised perfusion (noted single-vessel peroneal runoff and concern for worsening symptoms in left foot).  VVS informed her that she does not have any revascularization options.  X-rays of her hand at that time did not show osteomyelitis.  There were difficulties with disposition at that time that required involvement of TOC for discharge.  She returned to the ER on 5/30 with reports of weakness, fatigue. Wound was clean. She received HD, & work up was negative at that time & she was discharged home.    6/5 with reports of abdominal pain.  The patient reported one week of pain, constipation, nausea and emesis.  She has been on doxycycline for finger infection.  She had fever to 101.6. Her glucose was low and she was treated with IV dextrose.  She also reported LLE toe pain.  Given abdominal pain, CT of the abdomen was obtained which raised question of indistinctness along the margin of the spleen with concern for possible splenic hemorrhage or injury.  After discussion with Radiology, it was recommended to obtain CT with contrast for better images. CTA of the abdomen / pelvis showed hypoenhancing bands in the spleen with a small amount of perisplenic free fluid, suggesting infarcts, trace pleural effusion with atelectasis or infiltrate. Her initial labs showed Na+ 134, K+ 3.3,  Cl 97, CO2 19, glucose 75, BUN 54, Sr Cr 10.70, AG 18, Phos 5.5, albumin 2.5, lactic acid 3.7, WBC 36, Hgb 8.8, and platelets 325. CXR without infiltrate. She had low blood pressure readings with BP assessment on wrist but normal mental status.  Unfortunately, she had very limited IV access.  Nephrology was consulted for HD.  Her baseline is TTS.   PCCM consulted for evaluation of blood pressure and difficult IV access.   Pertinent  Medical History  DM II  HTN HLD  ESRD on HD  Gout   Significant Hospital Events: Including procedures, antibiotic start and stop dates in addition to other pertinent events   6/5 Admit with abdominal pain, work up concerning for splenic infarct  Interim History / Subjective:  Remains on neo. NAEON.  Objective   Blood pressure (!) 136/97, pulse 85, temperature 98.3 F (36.8 C), temperature source Axillary, resp. rate (!) 22, height 5\' 5"  (1.651 m), weight (!) 151.3 kg, SpO2 100 %.        Intake/Output Summary (Last 24 hours) at 09/11/2021 0839 Last data filed at 09/11/2021 0700 Gross per 24 hour  Intake 2197.94 ml  Output --  Net 2197.94 ml    Filed Weights   09/10/21 0900 09/10/21 2100  Weight: (!) 150.6 kg (!) 151.3 kg    Examination: General: chronically ill appearing adult female, resting comfortably, in NAD HEENT: MM pink/moist, eomi Lungs: non-labored at rest, lungs bilaterally clear  Cardiovascular: RRR, no m/r/g Abdomen: protuberant, large panus, bsx4 active  Extremities:  warm/dry, R AKA with staples intact, small area of drainage. L hand with wound to tip of  middle finger (see photo below)   Neuro: AAOx3, speech clear, MAE  Assessment & Plan:   Fever - unclear etiology though currently afebrile; however, WBC 44. May be from splenic process, less likely LLL as is more likely atelectasis.  Other differential to consider include surgical site but largely clean without drainage, left finger wound & constipation (as source of fever).   -follow cultures -continue empiric abx given degree of leukocytosis (cefepime/vanco) -check PCT to get baseline then trend -bowel regimen -PRN tylenol   Splenic Infarction -Continue heparin infusion -follow abd exam   Hypotension - ? Accuracy given cuff location. Hx HTN, HLD  -Switch neo to levo, continue as needed to maintain goal MAP > 65 -continue lipitor  -hold home norvasc, coreg, lisinopril  ESRD on HD TTS -Nephrology following, appreciate the assistance -Trend BMP / urinary output -Replace electrolytes as indicated -hepatitis antibiodies per Nephro  PVD s/p right AKA early May 2023 -VVS following, appreciate the assistance -VVS checking CTA chest to r/o embolizing proximal aortic lesion -elevate stump, keep clean / dry  Difficult IV Access  Has R IJ temp cath, very small left IJ / appears more narrow on US exam as it nears the clavicle.  Panus too large to place femoral line and compromised flow to LLE prohibits femoral placement. S/p midline placement by IR 6/6 - Depending on course and need for ongoing pressors etc, might need to ask IR for assistance with CVL placement  Chronic Hypoxic Respiratory Failure  Suspected OHS -O2 as needed to support sats 90-95% -consider outpatient sleep study  DM II with Hypoglycemia  -hold home agents for now   Constipation  -bowel regimen  Left Hand Infection - complicated by ischemia/compromised flow. UE arterial duplex 08/11/21 with possible 50-74% stenosis in distal radial artery/multiphasic flow noted by vascular surgery -hold further doxycycline as currently broadened out to cefepime/vanc -Hand surgery consult recommended by VVS, I have consulted them today 6/7  Best Practice (right click and "Reselect all SmartList Selections" daily)   Diet/type: Regular consistency (see orders) DVT prophylaxis: systemic heparin GI prophylaxis: PPI Lines: Dialysis Catheter Foley:  N/A Code Status:  full code Last date of  multidisciplinary goals of care discussion:  Patient and daughter updated on plan of care.  Critical care time: 40 minutes     Montey Hora, [place] - [facility] For pager details, please see AMION or use Epic chat  After 1900, please call [facility] for cross coverage needs 09/11/2021, 8:58 AM

## 2021-09-11 NOTE — Progress Notes (Signed)
This nurse received IV consult. Upon arrival to unit. It was noted that patient had a midline, not a PICC/CVC. Instructed nurse that vasopressor cannot infuse through a midline. Dr. Lucile Shutters notified. Order received from nephrology to infusing vasopressor through HD. Fran Lowes, RN VAST

## 2021-09-11 NOTE — Consult Note (Signed)
WOC Nurse Consult Note: Reason for Consult:Moisture associated skin damage to bilateral gluteal folds.  Currently having loose stools, admitted with constipation and on lactulose.  Obese body habitus with large abdominal pannus and recent AKA make mobility, in and out of bed challenging.  Is on mattress with low air loss feature.  Will request no disposable briefs or underpads. Skin to remain in contact with dermatherapy linen to wick moisture and improve skin microclimate.   Wound type:Moisture associated skin damage to buttocks Pressure Injury POA: breakdown is combination of pressure and moisture Measurement: left 3 cm x 2 cm x 0.1 cm and right 2 cm x 2 cm x 0.1 cm, obtained by bedside RN. Wound ZLD:JTTS and moist Drainage (amount, consistency, odor) scant weeping  no odor Periwound:intact  exposed to incontinence at times.  Dressing procedure/placement/frequency:Cleanse buttocks wounds with NS and pat dry. Apply gerhardts paste to open wounds.  Leave open to air and in contact with dermatherapy.  Turn and reposition.   Left finger ischemic and orthopedic surgery is following and monitoring for surgical need.  Paint open wound to left finger with betadine daily.  Will not follow at this time.  Please re-consult if needed.  Domenic Moras MSN, RN, FNP-BC CWON Wound, Ostomy, Continence Nurse Pager [phone]

## 2021-09-11 NOTE — Progress Notes (Signed)
ANTICOAGULATION CONSULT NOTE   Pharmacy Consult for Heparin infusion Indication:  splenic infarction  Allergies  Allergen Reactions   Benzonatate Other (See Comments)    Hallucinations   Hydralazine Other (See Comments)    Hallucinations   Amoxicillin Itching and Nausea And Vomiting   Clonidine Other (See Comments)    Altered mental state, insomnia    Chlorhexidine Itching   Morphine Itching   Oxycodone Itching    Pt tolerated hospital admission 07/2021    Patient Measurements: Height: 5\' 5"  (165.1 cm) Weight: (!) 150.6 kg (332 lb 0.2 oz) IBW/kg (Calculated) : 57 Heparin Dosing Weight: 95.1 kg  Vital Signs: Temp: 97.8 F (36.6 C) (06/07 0308) Temp Source: Oral (06/07 0308) BP: 100/47 (06/06 2145) Pulse Rate: 74 (06/06 2200)  Labs: Recent Labs    09/10/21 0031 09/10/21 0040 09/10/21 0340 09/10/21 0403 09/10/21 0920 09/10/21 1610 09/10/21 1945 09/11/21 0324  HGB 9.5* 11.9*  --   --  8.8*  --   --  9.0*  HCT 32.3* 35.0*  --   --  30.9*  --   --  30.4*  PLT 384  --   --   --  325  --   --  328  LABPROT  --   --   --  16.1*  --   --   --   --   INR  --   --   --  1.3*  --   --   --   --   HEPARINUNFRC  --   --   --   --   --   --  <0.10* 0.86*  CREATININE 10.19* 11.20*  --   --  10.70* 10.95*  --  11.23*  TROPONINIHS 33*  --  57*  --   --   --   --   --      Estimated Creatinine Clearance: 8.8 mL/min (A) (by C-G formula based on SCr of 11.23 mg/dL (H)).   Medical History: Past Medical History:  Diagnosis Date   Arthritis    Diabetes mellitus without complication ([place])    Dialysis complication, subsequent encounter    Gout    High cholesterol    Hypertension    Renal disorder     Medications:  Medications Prior to Admission  Medication Sig Dispense Refill Last Dose   acetaminophen (TYLENOL) 500 MG tablet Take 1,000 mg by mouth as needed for moderate pain.   09/09/2021   amLODipine (NORVASC) 10 MG tablet Take 1 tablet (10 mg total) by mouth daily.  (Patient taking differently: Take 10 mg by mouth daily as needed (Blood pressusre).) 30 tablet 5 unk   aspirin EC 81 MG tablet Take 1 tablet (81 mg total) by mouth daily with breakfast. 30 tablet 11 09/09/2021   atorvastatin (LIPITOR) 10 MG tablet Take 1 tablet (10 mg total) by mouth daily. (Patient taking differently: Take 10 mg by mouth at bedtime.) 30 tablet 5 09/08/2021   calcium carbonate (TUMS SMOOTHIES) 750 MG chewable tablet Chew 1,500 mg by mouth See admin instructions. Take 1500 mg with each meal and 1500 mg with each snack   09/09/2021   carvedilol (COREG) 6.25 MG tablet Take 1 tablet (6.25 mg total) by mouth 2 (two) times daily with a meal. (Patient taking differently: Take 6.25 mg by mouth 2 (two) times daily as needed (if BP > 150).) 60 tablet 0 Past Week at unk   diphenhydrAMINE (BENADRYL) 25 MG tablet Take 25 mg by mouth  2 (two) times daily as needed for allergies.   unk   docusate sodium (COLACE) 100 MG capsule Take 100 mg by mouth 2 (two) times daily.   09/09/2021   gabapentin (NEURONTIN) 100 MG capsule Take 1 capsule (100 mg total) by mouth 2 (two) times daily. (Patient taking differently: Take 100 mg by mouth See admin instructions. Take 100 mg by mouth twice a day on non dialysis days. On dialysis day take 100 mg in the morning, 200 mg after dialysis, and then 100 mg at bedtime.) 60 capsule 0 09/09/2021   glipiZIDE (GLUCOTROL XL) 2.5 MG 24 hr tablet Take 1 tablet (2.5 mg total) by mouth daily with breakfast. 30 tablet 0 09/09/2021   HYDROcodone-acetaminophen (NORCO/VICODIN) 5-325 MG tablet Take 1-2 tablets by mouth every 4 (four) hours as needed for moderate pain. (Patient taking differently: Take 1 tablet by mouth every 4 (four) hours as needed for moderate pain.) 20 tablet 0 09/08/2021   insulin glargine (LANTUS) 100 UNIT/ML Solostar Pen Inject 12 Units into the skin daily.   Past Week   loperamide (IMODIUM A-D) 2 MG tablet Take 4 mg by mouth as needed for diarrhea or loose stools.   unk    nitroGLYCERIN (NITROSTAT) 0.4 MG SL tablet Place 1 tablet (0.4 mg total) under the tongue every 5 (five) minutes as needed for chest pain. 10 tablet 3 unk   pantoprazole (PROTONIX) 40 MG tablet Take 1 tablet (40 mg total) by mouth daily. 30 tablet 5 09/09/2021   simethicone (MYLICON) 086 MG chewable tablet Chew 125 mg by mouth in the morning and at bedtime.   09/09/2021   tiZANidine (ZANAFLEX) 4 MG capsule Take 4 mg by mouth in the morning and at bedtime.   09/09/2021   doxycycline (VIBRAMYCIN) 100 MG capsule Take 100 mg by mouth 2 (two) times daily. (Patient not taking: Reported on 09/10/2021)   Completed Course   lisinopril (ZESTRIL) 20 MG tablet Take 1 tablet (20 mg total) by mouth daily. (Patient not taking: Reported on 09/10/2021) 30 tablet 0 Not Taking   traMADol (ULTRAM) 50 MG tablet Take 1 tablet (50 mg total) by mouth every 6 (six) hours as needed for moderate pain. (Patient not taking: Reported on 09/10/2021) 20 tablet 0 Completed Course    Assessment: [name] presented to ED with c/o abdominal pain, constipation, nausea, and vomiting x 1 week. CT abdomen shows splenic infarction. Pharmacy consulted to dose and manage heparin infusion.  Hgb 11.9, Plt 384, INR 1.3 (not on anticoagulation prior to admission)  6/7 AM update: Heparin level above goal  Goal of Therapy:  Heparin level 0.3-0.7 units/ml Monitor platelets by anticoagulation protocol: Yes   Plan:  Dec heparin to 1400 units/hr 1300 heparin level  Narda Bonds, PharmD, BCPS Clinical Pharmacist Phone: [phone]

## 2021-09-11 NOTE — Consult Note (Addendum)
Reason for Consult:Left long finger ischemia Referring Physician: Rayann Heman Time called: 4709 Time at bedside: [place] is an 51 y.o. female.  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 She was also noted to have ischemic changes to her left long fingertip. This has continued to evolve over the last 5 weeks. She c/o skin changes and pain in the tip. VVS has said she does not have revascularization potential. Hand surgery was consulted today. She is LHD.  Past Medical History:  Diagnosis Date   Arthritis    Diabetes mellitus without complication ([place])    Dialysis complication, subsequent encounter    Gout    High cholesterol    Hypertension    Renal disorder     Past Surgical History:  Procedure Laterality Date   ABDOMINAL AORTOGRAM W/LOWER EXTREMITY N/A 08/07/2021   Procedure: ABDOMINAL AORTOGRAM W/LOWER EXTREMITY;  Surgeon: Broadus John, MD;  Location: [facility];  Service: Cardiovascular;  Laterality: N/A;   AMPUTATION Right 08/14/2021   Procedure: AMPUTATION ABOVE KNEE;  Surgeon: Marty Heck, MD;  Location: [facility];  Service: Vascular;  Laterality: Right;   AV FISTULA PLACEMENT     x2, unsuccessful.    IR FLUORO GUIDE CV LINE LEFT  12/14/2020   IR FLUORO GUIDE CV LINE LEFT  02/20/2021   IR FLUORO GUIDE CV LINE RIGHT  08/28/2020   IR FLUORO GUIDE CV LINE RIGHT  05/13/2021   IR FLUORO GUIDE CV LINE RIGHT  05/24/2021   IR PERC TUN PERIT CATH WO PORT S&I /IMAG  09/06/2020   IR PTA VENOUS EXCEPT DIALYSIS CIRCUIT  02/20/2021   IR PTA VENOUS EXCEPT DIALYSIS CIRCUIT  05/13/2021   IR REMOVAL TUN CV CATH W/O FL  05/24/2021   IR US GUIDE VASC ACCESS LEFT  09/06/2020   IR US GUIDE VASC ACCESS RIGHT  05/24/2021   IR US GUIDE VASC ACCESS RIGHT  09/10/2021   IR VENO/JUGULAR RIGHT  09/10/2021   IR VENOCAVAGRAM SVC  02/20/2021    Family History  Problem Relation Age of Onset    Diabetes Mother    Diabetes Father     Social History:  reports that she has never smoked. She has never used smokeless tobacco. She reports that she does not drink alcohol and does not use drugs.  Allergies:  Allergies  Allergen Reactions   Benzonatate Other (See Comments)    Hallucinations   Hydralazine Other (See Comments)    Hallucinations   Amoxicillin Itching and Nausea And Vomiting   Clonidine Other (See Comments)    Altered mental state, insomnia    Chlorhexidine Itching   Morphine Itching   Oxycodone Itching    Pt tolerated hospital admission 07/2021    Medications: I have reviewed the patient's current medications.  Results for orders placed or performed during the hospital encounter of 09/09/21 (from the past 48 hour(s))  CBG monitoring, ED     Status: Abnormal   Collection Time: 09/09/21 11:42 PM  Result Value Ref Range   Glucose-Capillary 44 (LL) 70 - 99 mg/dL    Comment: Glucose reference range applies only to samples taken after fasting for at least 8 hours.  Comprehensive metabolic panel     Status: Abnormal   Collection Time: 09/10/21 12:31 AM  Result Value Ref Range   Sodium 135 135 - 145 mmol/L   Potassium 3.4 (L) 3.5 -  5.1 mmol/L   Chloride 95 (L) 98 - 111 mmol/L   CO2 24 22 - 32 mmol/L   Glucose, Bld 42 (LL) 70 - 99 mg/dL    Comment: Glucose reference range applies only to samples taken after fasting for at least 8 hours. CRITICAL RESULT CALLED TO, READ BACK BY AND VERIFIED WITH: SANGELANG B,RN 09/10/21 0119 WAYK    BUN 49 (H) 6 - 20 mg/dL   Creatinine, Ser 10.19 (H) 0.44 - 1.00 mg/dL   Calcium 8.4 (L) 8.9 - 10.3 mg/dL   Total Protein 7.8 6.5 - 8.1 g/dL   Albumin 2.8 (L) 3.5 - 5.0 g/dL   AST 18 15 - 41 U/L   ALT 11 0 - 44 U/L   Alkaline Phosphatase 79 38 - 126 U/L   Total Bilirubin 0.7 0.3 - 1.2 mg/dL   GFR, Estimated 4 (L) >60 mL/min    Comment: (NOTE) Calculated using the CKD-EPI Creatinine Equation (2021)    Anion gap 16 (H) 5 - 15     Comment: Performed at [facility], [place] [address] [place], [place] [address]  Lipase, blood     Status: None   Collection Time: 09/10/21 12:31 AM  Result Value Ref Range   Lipase 37 11 - 51 U/L    Comment: Performed at [facility], [place] [address] [place], [place] [address]  CBC with Differential     Status: Abnormal   Collection Time: 09/10/21 12:31 AM  Result Value Ref Range   WBC 14.2 (H) 4.0 - 10.5 K/uL   RBC 3.40 (L) 3.87 - 5.11 MIL/uL   Hemoglobin 9.5 (L) 12.0 - 15.0 g/dL   HCT 32.3 (L) 36.0 - 46.0 %   MCV 95.0 80.0 - 100.0 fL   MCH 27.9 26.0 - 34.0 pg   MCHC 29.4 (L) 30.0 - 36.0 g/dL   RDW 18.6 (H) 11.5 - 15.5 %   Platelets 384 150 - 400 K/uL   nRBC 0.1 0.0 - 0.2 %   Neutrophils Relative % 92 %   Neutro Abs 13.1 (H) 1.7 - 7.7 K/uL   Lymphocytes Relative 4 %   Lymphs Abs 0.6 (L) 0.7 - 4.0 K/uL   Monocytes Relative 3 %   Monocytes Absolute 0.4 0.1 - 1.0 K/uL   Eosinophils Relative 1 %   Eosinophils Absolute 0.1 0.0 - 0.5 K/uL   Basophils Relative 0 %   Basophils Absolute 0.0 0.0 - 0.1 K/uL   Immature Granulocytes 0 %   Abs Immature Granulocytes 0.05 0.00 - 0.07 K/uL    Comment: Performed at [facility], [place] [address] [place], [place] [address]  Troponin I (High Sensitivity)     Status: Abnormal   Collection Time: 09/10/21 12:31 AM  Result Value Ref Range   Troponin I (High Sensitivity) 33 (H) <18 ng/L    Comment: (NOTE) Elevated high sensitivity troponin I (hsTnI) values and significant  changes across serial measurements may suggest ACS but many other  chronic and acute conditions are known to elevate hsTnI results.  Refer to the Links section for chest pain algorithms and additional  guidance. Performed at [facility], [place] [address] [place], [place] [address]   I-stat chem 8, ED     Status: Abnormal   Collection Time: 09/10/21 12:40 AM  Result Value Ref Range   Sodium 135 135 - 145 mmol/L   Potassium 3.3 (L) 3.5 - 5.1 mmol/L    Chloride 96 (L)  98 - 111 mmol/L   BUN 48 (H) 6 - 20 mg/dL   Creatinine, Ser 11.20 (H) 0.44 - 1.00 mg/dL   Glucose, Bld 39 (LL) 70 - 99 mg/dL    Comment: Glucose reference range applies only to samples taken after fasting for at least 8 hours.   Calcium, Ion 0.95 (L) 1.15 - 1.40 mmol/L   TCO2 25 22 - 32 mmol/L   Hemoglobin 11.9 (L) 12.0 - 15.0 g/dL   HCT 35.0 (L) 36.0 - 46.0 %   Comment NOTIFIED PHYSICIAN   CBG monitoring, ED     Status: Abnormal   Collection Time: 09/10/21  1:50 AM  Result Value Ref Range   Glucose-Capillary 61 (L) 70 - 99 mg/dL    Comment: Glucose reference range applies only to samples taken after fasting for at least 8 hours.   Comment 1 Notify RN   Troponin I (High Sensitivity)     Status: Abnormal   Collection Time: 09/10/21  3:40 AM  Result Value Ref Range   Troponin I (High Sensitivity) 57 (H) <18 ng/L    Comment: RESULT CALLED TO, READ BACK BY AND VERIFIED WITH: SANGELANG B,RN 09/10/21 0459 WAYK (NOTE) Elevated high sensitivity troponin I (hsTnI) values and significant  changes across serial measurements may suggest ACS but many other  chronic and acute conditions are known to elevate hsTnI results.  Refer to the Links section for chest pain algorithms and additional  guidance. Performed at [facility], [place] [address] [place], [place] [address]   Protime-INR     Status: Abnormal   Collection Time: 09/10/21  4:03 AM  Result Value Ref Range   Prothrombin Time 16.1 (H) 11.4 - 15.2 seconds   INR 1.3 (H) 0.8 - 1.2    Comment: (NOTE) INR goal varies based on device and disease states. Performed at [facility], [place] [address] [place], [place] [address]   CBG monitoring, ED     Status: Abnormal   Collection Time: 09/10/21  4:10 AM  Result Value Ref Range   Glucose-Capillary 132 (H) 70 - 99 mg/dL    Comment: Glucose reference range applies only to samples taken after fasting for at least 8 hours.  Type and screen [place]     Status: None   Collection Time: 09/10/21  5:30 AM  Result Value Ref Range   ABO/RH(D) O POS    Antibody Screen NEG    Sample Expiration      09/13/2021,2359 Performed at [facility], [place] [address], [place], [place] [address]   Lactic acid, plasma     Status: Abnormal   Collection Time: 09/10/21  5:30 AM  Result Value Ref Range   Lactic Acid, Venous 3.7 (HH) 0.5 - 1.9 mmol/L    Comment: CRITICAL RESULT CALLED TO, READ BACK BY AND VERIFIED WITH: Gillis Ends RN 09/10/2021 [id number] DAVISB Performed at [facility], [place] [address], [place], [place] [address]   CBG monitoring, ED     Status: Abnormal   Collection Time: 09/10/21  8:47 AM  Result Value Ref Range   Glucose-Capillary 18 (LL) 70 - 99 mg/dL    Comment: Glucose reference range applies only to samples taken after fasting for at least 8 hours.  Lactic acid, plasma     Status: Abnormal   Collection Time: 09/10/21  9:20 AM  Result Value Ref Range   Lactic Acid, Venous 2.2 (HH) 0.5 - 1.9 mmol/L  Comment: CRITICAL VALUE NOTED.  VALUE IS CONSISTENT WITH PREVIOUSLY REPORTED AND CALLED VALUE. Performed at [facility], [place] [address], [place], [place] [address]   CBC     Status: Abnormal   Collection Time: 09/10/21  9:20 AM  Result Value Ref Range   WBC 36.0 (H) 4.0 - 10.5 K/uL   RBC 3.18 (L) 3.87 - 5.11 MIL/uL   Hemoglobin 8.8 (L) 12.0 - 15.0 g/dL   HCT 30.9 (L) 36.0 - 46.0 %   MCV 97.2 80.0 - 100.0 fL   MCH 27.7 26.0 - 34.0 pg   MCHC 28.5 (L) 30.0 - 36.0 g/dL   RDW 18.6 (H) 11.5 - 15.5 %   Platelets 325 150 - 400 K/uL   nRBC 0.0 0.0 - 0.2 %    Comment: Performed at [facility], [place] [address], [place], [place] [address]  Renal function panel     Status: Abnormal   Collection Time: 09/10/21  9:20 AM  Result Value Ref Range   Sodium 134 (L) 135 - 145 mmol/L   Potassium 3.3 (L) 3.5 - 5.1 mmol/L   Chloride 97 (L) 98 - 111 mmol/L   CO2 19 (L) 22 - 32 mmol/L   Glucose, Bld 75 70 - 99 mg/dL     Comment: Glucose reference range applies only to samples taken after fasting for at least 8 hours.   BUN 54 (H) 6 - 20 mg/dL   Creatinine, Ser 10.70 (H) 0.44 - 1.00 mg/dL   Calcium 8.0 (L) 8.9 - 10.3 mg/dL   Phosphorus 5.5 (H) 2.5 - 4.6 mg/dL   Albumin 2.5 (L) 3.5 - 5.0 g/dL   GFR, Estimated 4 (L) >60 mL/min    Comment: (NOTE) Calculated using the CKD-EPI Creatinine Equation (2021)    Anion gap 18 (H) 5 - 15    Comment: Performed at [facility] [address], [place], [place] [address]  CBG monitoring, ED     Status: Abnormal   Collection Time: 09/10/21  9:28 AM  Result Value Ref Range   Glucose-Capillary 62 (L) 70 - 99 mg/dL    Comment: Glucose reference range applies only to samples taken after fasting for at least 8 hours.  CBG monitoring, ED     Status: Abnormal   Collection Time: 09/10/21  9:57 AM  Result Value Ref Range   Glucose-Capillary 124 (H) 70 - 99 mg/dL    Comment: Glucose reference range applies only to samples taken after fasting for at least 8 hours.  CBG monitoring, ED     Status: Abnormal   Collection Time: 09/10/21 10:50 AM  Result Value Ref Range   Glucose-Capillary 50 (L) 70 - 99 mg/dL    Comment: Glucose reference range applies only to samples taken after fasting for at least 8 hours.  CBG monitoring, ED     Status: Abnormal   Collection Time: 09/10/21 11:25 AM  Result Value Ref Range   Glucose-Capillary 36 (LL) 70 - 99 mg/dL    Comment: Glucose reference range applies only to samples taken after fasting for at least 8 hours.  CBG monitoring, ED     Status: Abnormal   Collection Time: 09/10/21 12:17 PM  Result Value Ref Range   Glucose-Capillary 41 (LL) 70 - 99 mg/dL    Comment: Glucose reference range applies only to samples taken after fasting for at least 8 hours.   Comment 1 Notify RN   CBG monitoring, ED  Status: Abnormal   Collection Time: 09/10/21 12:57 PM  Result Value Ref Range   Glucose-Capillary 139 (H) 70 - 99 mg/dL     Comment: Glucose reference range applies only to samples taken after fasting for at least 8 hours.  Glucose, capillary     Status: Abnormal   Collection Time: 09/10/21  2:20 PM  Result Value Ref Range   Glucose-Capillary 37 (LL) 70 - 99 mg/dL    Comment: Glucose reference range applies only to samples taken after fasting for at least 8 hours.  Glucose, capillary     Status: Abnormal   Collection Time: 09/10/21  3:23 PM  Result Value Ref Range   Glucose-Capillary 17 (LL) 70 - 99 mg/dL    Comment: Glucose reference range applies only to samples taken after fasting for at least 8 hours.   Comment 1 Repeat Test   Glucose, capillary     Status: Abnormal   Collection Time: 09/10/21  3:26 PM  Result Value Ref Range   Glucose-Capillary 39 (LL) 70 - 99 mg/dL    Comment: Glucose reference range applies only to samples taken after fasting for at least 8 hours.  Glucose, capillary     Status: Abnormal   Collection Time: 09/10/21  3:43 PM  Result Value Ref Range   Glucose-Capillary 32 (LL) 70 - 99 mg/dL    Comment: Glucose reference range applies only to samples taken after fasting for at least 8 hours.   Comment 1 Document in Chart   Hepatitis B surface antigen     Status: None   Collection Time: 09/10/21  3:58 PM  Result Value Ref Range   Hepatitis B Surface Ag NON REACTIVE NON REACTIVE    Comment: Performed at [facility] [address] [place], [place] [address]  Hepatitis B core antibody, total     Status: None   Collection Time: 09/10/21  3:58 PM  Result Value Ref Range   Hep B Core Total Ab NON REACTIVE NON REACTIVE    Comment: Performed at [facility] [address], [place], [place] [address]  Hepatitis B surface antibody,qualitative     Status: None   Collection Time: 09/10/21  3:58 PM  Result Value Ref Range   Hep B S Ab NON REACTIVE NON REACTIVE    Comment: (NOTE) Inconsistent with immunity, less than 10 mIU/mL.  Performed at [facility], [place]  [address], [place], [place] [address]   Hepatitis B surface antibody,quantitative     Status: Abnormal   Collection Time: 09/10/21  3:58 PM  Result Value Ref Range   Hepatitis B-Post <3.1 (L) Immunity>9.9 mIU/mL    Comment: (NOTE)  Status of Immunity                     Anti-HBs Level  ------------------                     -------------- Inconsistent with Immunity                   0.0 - 9.9 Consistent with Immunity                          >9.9 Performed At: [facility] [place], [place] [id number] Rush Farmer MD [id number]   Hepatitis C antibody     Status: None   Collection Time: 09/10/21  3:58 PM  Result Value  Ref Range   HCV Ab NON REACTIVE NON REACTIVE    Comment: (NOTE) Nonreactive HCV antibody screen is consistent with no HCV infections,  unless recent infection is suspected or other evidence exists to indicate HCV infection.  Performed at [facility], [place] [address] [place], [place] [address]   Basic metabolic panel     Status: Abnormal   Collection Time: 09/10/21  4:10 PM  Result Value Ref Range   Sodium 135 135 - 145 mmol/L   Potassium 4.1 3.5 - 5.1 mmol/L    Comment: NO VISIBLE HEMOLYSIS   Chloride 94 (L) 98 - 111 mmol/L   CO2 21 (L) 22 - 32 mmol/L   Glucose, Bld <20 (LL) 70 - 99 mg/dL    Comment: RESULT REPEATED AND VERIFIED CRITICAL RESULT CALLED TO, READ BACK BY AND VERIFIED WITH: M.DYBING,RN _0  063/09/2021 [email or web address]    BUN 57 (H) 6 - 20 mg/dL   Creatinine, Ser 10.95 (H) 0.44 - 1.00 mg/dL   Calcium 8.2 (L) 8.9 - 10.3 mg/dL   GFR, Estimated 4 (L) >60 mL/min    Comment: (NOTE) Calculated using the CKD-EPI Creatinine Equation (2021)    Anion gap 20 (H) 5 - 15    Comment: Performed at [facility] [address], [place], [place] [address]  Glucose, capillary     Status: Abnormal   Collection Time: 09/10/21  4:22 PM  Result Value Ref Range   Glucose-Capillary <10 (LL) 70 - 99 mg/dL    Comment: Glucose reference  range applies only to samples taken after fasting for at least 8 hours.  Glucose, capillary     Status: Abnormal   Collection Time: 09/10/21  5:10 PM  Result Value Ref Range   Glucose-Capillary 48 (L) 70 - 99 mg/dL    Comment: Glucose reference range applies only to samples taken after fasting for at least 8 hours.  MRSA Next Gen by PCR, Nasal     Status: None   Collection Time: 09/10/21  5:36 PM   Specimen: Nasal Mucosa; Nasal Swab  Result Value Ref Range   MRSA by PCR Next Gen NOT DETECTED NOT DETECTED    Comment: (NOTE) The GeneXpert MRSA Assay (FDA approved for NASAL specimens only), is one component of a comprehensive MRSA colonization surveillance program. It is not intended to diagnose MRSA infection nor to guide or monitor treatment for MRSA infections. Test performance is not FDA approved in patients less than 49 years old. Performed at [facility], [place] [address], [place], [place] [address]   Glucose, capillary     Status: Abnormal   Collection Time: 09/10/21  6:02 PM  Result Value Ref Range   Glucose-Capillary 47 (L) 70 - 99 mg/dL    Comment: Glucose reference range applies only to samples taken after fasting for at least 8 hours.  Glucose, capillary     Status: Abnormal   Collection Time: 09/10/21  7:44 PM  Result Value Ref Range   Glucose-Capillary 119 (H) 70 - 99 mg/dL    Comment: Glucose reference range applies only to samples taken after fasting for at least 8 hours.  Heparin level (unfractionated)     Status: Abnormal   Collection Time: 09/10/21  7:45 PM  Result Value Ref Range   Heparin Unfractionated <0.10 (L) 0.30 - 0.70 IU/mL    Comment: (NOTE) The clinical reportable range upper limit is being lowered to >1.10 to align with the FDA approved guidance for the current laboratory  assay.  If heparin results are below expected values, and patient dosage has  been confirmed, suggest follow up testing of antithrombin III levels. Performed at [facility], [place] [address], [place], [place] [address]   Glucose, capillary     Status: None   Collection Time: 09/10/21 10:20 PM  Result Value Ref Range   Glucose-Capillary 93 70 - 99 mg/dL    Comment: Glucose reference range applies only to samples taken after fasting for at least 8 hours.  Glucose, capillary     Status: None   Collection Time: 09/10/21 11:57 PM  Result Value Ref Range   Glucose-Capillary 82 70 - 99 mg/dL    Comment: Glucose reference range applies only to samples taken after fasting for at least 8 hours.  Glucose, capillary     Status: Abnormal   Collection Time: 09/11/21  2:13 AM  Result Value Ref Range   Glucose-Capillary 60 (L) 70 - 99 mg/dL    Comment: Glucose reference range applies only to samples taken after fasting for at least 8 hours.  Magnesium     Status: None   Collection Time: 09/11/21  3:24 AM  Result Value Ref Range   Magnesium 1.8 1.7 - 2.4 mg/dL    Comment: Performed at [facility] [address] [place], [place] [address]  CBC with Differential/Platelet     Status: Abnormal   Collection Time: 09/11/21  3:24 AM  Result Value Ref Range   WBC 44.3 (H) 4.0 - 10.5 K/uL   RBC 3.23 (L) 3.87 - 5.11 MIL/uL   Hemoglobin 9.0 (L) 12.0 - 15.0 g/dL   HCT 30.4 (L) 36.0 - 46.0 %   MCV 94.1 80.0 - 100.0 fL   MCH 27.9 26.0 - 34.0 pg   MCHC 29.6 (L) 30.0 - 36.0 g/dL   RDW 18.9 (H) 11.5 - 15.5 %   Platelets 328 150 - 400 K/uL   nRBC 0.0 0.0 - 0.2 %   Neutrophils Relative % 89 %   Neutro Abs 39.5 (H) 1.7 - 7.7 K/uL   Lymphocytes Relative 2 %   Lymphs Abs 0.8 0.7 - 4.0 K/uL   Monocytes Relative 5 %   Monocytes Absolute 2.0 (H) 0.1 - 1.0 K/uL   Eosinophils Relative 0 %   Eosinophils Absolute 0.0 0.0 - 0.5 K/uL   Basophils Relative 0 %   Basophils Absolute 0.1 0.0 - 0.1 K/uL   Immature Granulocytes 4 %   Abs Immature Granulocytes 1.96 (H) 0.00 - 0.07 K/uL    Comment: Performed at [facility] [address], [place], [place] [address]   Comprehensive metabolic panel     Status: Abnormal   Collection Time: 09/11/21  3:24 AM  Result Value Ref Range   Sodium 132 (L) 135 - 145 mmol/L   Potassium 4.4 3.5 - 5.1 mmol/L   Chloride 94 (L) 98 - 111 mmol/L   CO2 22 22 - 32 mmol/L   Glucose, Bld 132 (H) 70 - 99 mg/dL    Comment: Glucose reference range applies only to samples taken after fasting for at least 8 hours.   BUN 60 (H) 6 - 20 mg/dL   Creatinine, Ser 11.23 (H) 0.44 - 1.00 mg/dL   Calcium 7.9 (L) 8.9 - 10.3 mg/dL   Total Protein 7.1 6.5 - 8.1 g/dL   Albumin 2.4 (L) 3.5 - 5.0 g/dL   AST 30 15 - 41 U/L   ALT 11 0 - 44  U/L   Alkaline Phosphatase 76 38 - 126 U/L   Total Bilirubin 0.8 0.3 - 1.2 mg/dL   GFR, Estimated 4 (L) >60 mL/min    Comment: (NOTE) Calculated using the CKD-EPI Creatinine Equation (2021)    Anion gap 16 (H) 5 - 15    Comment: Performed at [facility] [address] [place], [place] [address]  Heparin level (unfractionated)     Status: Abnormal   Collection Time: 09/11/21  3:24 AM  Result Value Ref Range   Heparin Unfractionated 0.86 (H) 0.30 - 0.70 IU/mL    Comment: (NOTE) The clinical reportable range upper limit is being lowered to >1.10 to align with the FDA approved guidance for the current laboratory assay.  If heparin results are below expected values, and patient dosage has  been confirmed, suggest follow up testing of antithrombin III levels. Performed at [facility], [place] [address], [place], [place] [address]   Glucose, capillary     Status: Abnormal   Collection Time: 09/11/21  3:24 AM  Result Value Ref Range   Glucose-Capillary 139 (H) 70 - 99 mg/dL    Comment: Glucose reference range applies only to samples taken after fasting for at least 8 hours.  Glucose, capillary     Status: Abnormal   Collection Time: 09/11/21  5:45 AM  Result Value Ref Range   Glucose-Capillary 103 (H) 70 - 99 mg/dL    Comment: Glucose reference range applies only to samples taken after  fasting for at least 8 hours.  Glucose, capillary     Status: Abnormal   Collection Time: 09/11/21  8:13 AM  Result Value Ref Range   Glucose-Capillary 132 (H) 70 - 99 mg/dL    Comment: Glucose reference range applies only to samples taken after fasting for at least 8 hours.    CT Abdomen Pelvis Wo Contrast  Result Date: 09/10/2021 CLINICAL DATA:  Abdominal pain EXAM: CT ABDOMEN AND PELVIS WITHOUT CONTRAST TECHNIQUE: Multidetector CT imaging of the abdomen and pelvis was performed following the standard protocol without IV contrast. RADIATION DOSE REDUCTION: This exam was performed according to the departmental dose-optimization program which includes automated exposure control, adjustment of the mA and/or kV according to patient size and/or use of iterative reconstruction technique. COMPARISON:  05/19/2021 FINDINGS: Lower chest: Mild basilar atelectasis on the left is noted. No sizable effusion is seen. Cardiac enlargement is again noted with dialysis catheter in place. Hepatobiliary: No focal liver abnormality is seen. No gallstones, gallbladder wall thickening, or biliary dilatation. Pancreas: Unremarkable. No pancreatic ductal dilatation or surrounding inflammatory changes. Spleen: Spleen demonstrates decreased attenuation within the substance of the spleen as well as indistinctness along the superior and lateral contour of the spleen. This is of uncertain significance although the possibility of an underlying splenic injury could not be totally excluded. Adrenals/Urinary Tract: Adrenal glands are within normal limits. The kidneys are atrophic consistent with the known end-stage renal disease. Obstructive changes are seen. The bladder is decompressed. Stomach/Bowel: No obstructive or inflammatory changes of the colon are seen. The appendix is within normal limits. Small bowel and stomach are unremarkable. Vascular/Lymphatic: Diffuse vascular calcifications are seen without acute abnormality. No  aneurysmal dilatation is noted. Reproductive: Calcifications are noted within the uterus related to arterial calcifications. No adnexal mass is seen. Other: No abdominal wall hernia or abnormality. No abdominopelvic ascites. Musculoskeletal: No acute or significant osseous findings. IMPRESSION: Indistinctness along the margin of the spleen as described. This is of uncertain significance  although the possibility of a splenic hemorrhage or injury could not be totally excluded. Repeat CT of the abdomen with contrast is recommended for further evaluation. Basilar atelectasis on the left. No findings to suggest bowel obstruction are seen. Changes consistent with end-stage renal disease. Critical Value/emergent results were called by telephone at the time of interpretation on 09/10/2021 at 1:55 am to Dr. Quintella Reichert , who verbally acknowledged these results. Electronically Signed   By: Inez Catalina M.D.   On: 09/10/2021 02:10   CT ABDOMEN W CONTRAST  Result Date: 09/10/2021 CLINICAL DATA:  Nausea, vomiting, and abdominal pain. Abnormal spleen. EXAM: CT ABDOMEN WITH CONTRAST TECHNIQUE: Multidetector CT imaging of the abdomen was performed using the standard protocol following bolus administration of intravenous contrast. RADIATION DOSE REDUCTION: This exam was performed according to the departmental dose-optimization program which includes automated exposure control, adjustment of the mA and/or kV according to patient size and/or use of iterative reconstruction technique. CONTRAST:  183m OMNIPAQUE IOHEXOL 300 MG/ML  SOLN COMPARISON:  09/10/2021 FINDINGS: Lower chest: Heart is enlarged and coronary artery calcifications are noted. The distal tip of a central venous catheter terminates in the right atrium. There is a small left pleural effusion with atelectasis or infiltrate at the left lung base. Mild atelectasis is noted at the right lung base. Hepatobiliary: No focal liver abnormality is seen. No gallstones,  gallbladder wall thickening, or biliary dilatation. Pancreas: Unremarkable. No pancreatic ductal dilatation or surrounding inflammatory changes. Spleen: Spleen is normal in size. There are linear hypodense bands in the upper and lower spleen with a trace amount of perisplenic free fluid. Adrenals/Urinary Tract: No adrenal nodule or mass. Renal atrophy is noted bilaterally. Vascular calcifications are noted bilaterally. No hydroureteronephrosis bilaterally. Stomach/Bowel: Stomach is within normal limits. The appendix is excluded from the field of view. No evidence of bowel wall thickening, distention, or inflammatory changes. Vascular/Lymphatic: Aortic atherosclerosis. No enlarged abdominal or pelvic lymph nodes. Other: Trace amount of free fluid in the perisplenic space. Musculoskeletal: Degenerative changes in the thoracolumbar spine. No acute osseous abnormality. IMPRESSION: 1. Hypoenhancing bands in the spleen with a small amount of perisplenic free fluid, suggesting infarcts. Laceration or rupture could also be considered if there is a history of trauma. 2. Trace left pleural effusion with atelectasis or infiltrate. 3. Bilateral renal atrophy. 4. Aortic atherosclerosis. Electronically Signed   By: LBrett FairyM.D.   On: 09/10/2021 03:25   IR Veno/Jugular Right  Result Date: 09/11/2021 INDICATION: 51year old female with history of end-stage renal disease, currently with poor venous access. EXAM: 1. Ultrasound-guided vascular access of the right internal jugular vein. 2. Limited right central venogram. 3. Ultrasound-guided vascular access of the right basilic vein. 4. Placement of right upper extremity midline. MEDICATIONS: None. CONTRAST:  5 mL Omnipaque 300, intravenous FLUOROSCOPY TIME:  6 minutes, 36 seconds, 870.9mGy COMPLICATIONS: None immediate. TECHNIQUE: The procedure, risks, benefits, and alternatives were explained to the patient and informed written consent was obtained. A timeout was performed  prior to the initiation of the procedure. The right neck was prepped and draped in standard fashion. Preprocedure ultrasound evaluation demonstrated partially occluded right internal jugular vein with indwelling right internal jugular vein tunneled hemodialysis catheter. Subdermal Local anesthesia was administered at the planned needle entry site with 1% lidocaine. A small skin nick was made. Under direct ultrasound visualization, a 21 gauge micropuncture was used to puncture the right internal jugular vein. A permanent ultrasound image was captured and stored in the record. A 0.018 inch  wire was inserted in met resistance. Under fluoroscopic guidance, the wire was attempted to be navigated around the indwelling hemodialysis catheter which was unsuccessful. 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 The needle was removed and hemostasis was achieved with manual compression. The left neck was examined under ultrasound which demonstrated an occluded left internal jugular vein. There was a patent vein laterally, possibly representing the external jugular vein, however this vein was not visualized clearly communicating with the central veins. Therefore, the decision was made to place right upper extremity midline. This is the same extremity of the abandoned arteriovenous fistula. The right upper extremity was prepped with chlorhexidine in a sterile fashion, and a sterile drape was applied covering the operative field. Maximum barrier sterile technique with sterile gowns and gloves were used for the procedure. A timeout was performed prior to the initiation of the procedure. Local anesthesia was provided with 1% lidocaine. Under direct ultrasound guidance, the basilic vein was accessed with a micropuncture kit after the overlying soft tissues were anesthetized with 1%  lidocaine. After the overlying soft tissues were anesthetized, a small venotomy incision was created and a micropuncture kit was utilized to access the right basilic vein. Real-time ultrasound guidance was utilized for vascular access including the acquisition of a permanent ultrasound image documenting patency of the accessed vessel. A guidewire was advanced to the level of the axillary vein. The PICC line was cut to 15 cm. A peel-away sheath was placed and a cm, 5 [place], dual lumen was inserted to level of the axillary. A post procedure spot fluoroscopic was obtained. The catheter easily aspirated and flushed and was secured in place with stat lock device. A dressing was applied. The patient tolerated the procedure well without immediate post procedural complication. FINDINGS: Occluded bilateral internal jugular veins. After catheter placement, the tip lies within the right axillary vein the catheter aspirates and flushes normally and is ready for immediate use. IMPRESSION: Successful ultrasound and fluoroscopic guided placement of a right basilic vein approach, 15 cm, 5 [place], dual lumen midline with tip in the right axillary vein. The line is ready for immediate use. Ruthann Cancer, MD [facility] Radiology Electronically Signed   By: Ruthann Cancer M.D.   On: 09/11/2021 08:39   IR US Guide Vasc Access Right  Result Date: 09/11/2021 INDICATION: 51 year old female with history of end-stage renal disease, currently with poor venous access. EXAM: 1. Ultrasound-guided vascular access of the right internal jugular vein. 2. Limited right central venogram. 3. Ultrasound-guided vascular access of the right basilic vein. 4. Placement of right upper extremity midline. MEDICATIONS: None. CONTRAST:  5 mL Omnipaque 300, intravenous FLUOROSCOPY TIME:  6 minutes, 36 seconds, 41.6 mGy COMPLICATIONS: None immediate. TECHNIQUE: The procedure, risks, benefits, and alternatives  were explained to the patient and informed written consent was obtained. A timeout was performed prior to the initiation of the procedure. The right neck was prepped and draped in standard fashion. Preprocedure ultrasound evaluation demonstrated partially occluded right internal jugular vein with indwelling right internal jugular vein tunneled hemodialysis catheter. Subdermal Local anesthesia was administered at the planned needle entry site with 1% lidocaine. A small skin nick was made. Under direct ultrasound visualization, a 21 gauge micropuncture was used to puncture the right internal jugular vein. A permanent ultrasound image was captured and stored in the record. A 0.018 inch wire was inserted in met  resistance. Under fluoroscopic guidance, the wire was attempted to be navigated around the indwelling hemodialysis catheter which was unsuccessful. 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 The needle was removed and hemostasis was achieved with manual compression. The left neck was examined under ultrasound which demonstrated an occluded left internal jugular vein. There was a patent vein laterally, possibly representing the external jugular vein, however this vein was not visualized clearly communicating with the central veins. Therefore, the decision was made to place right upper extremity midline. This is the same extremity of the abandoned arteriovenous fistula. The right upper extremity was prepped with chlorhexidine in a sterile fashion, and a sterile drape was applied covering the operative field. Maximum barrier sterile technique with sterile gowns and gloves were used for the procedure. A timeout was performed prior to the initiation of the procedure. Local anesthesia was provided with 1% lidocaine. Under direct ultrasound guidance, the basilic vein was  accessed with a micropuncture kit after the overlying soft tissues were anesthetized with 1% lidocaine. After the overlying soft tissues were anesthetized, a small venotomy incision was created and a micropuncture kit was utilized to access the right basilic vein. Real-time ultrasound guidance was utilized for vascular access including the acquisition of a permanent ultrasound image documenting patency of the accessed vessel. A guidewire was advanced to the level of the axillary vein. The PICC line was cut to 15 cm. A peel-away sheath was placed and a cm, 5 [place], dual lumen was inserted to level of the axillary. A post procedure spot fluoroscopic was obtained. The catheter easily aspirated and flushed and was secured in place with stat lock device. A dressing was applied. The patient tolerated the procedure well without immediate post procedural complication. FINDINGS: Occluded bilateral internal jugular veins. After catheter placement, the tip lies within the right axillary vein the catheter aspirates and flushes normally and is ready for immediate use. IMPRESSION: Successful ultrasound and fluoroscopic guided placement of a right basilic vein approach, 15 cm, 5 [place], dual lumen midline with tip in the right axillary vein. The line is ready for immediate use. Ruthann Cancer, MD [facility] Radiology Electronically Signed   By: Ruthann Cancer M.D.   On: 09/11/2021 08:39   DG Chest Port 1 View  Result Date: 09/11/2021 CLINICAL DATA:  Acute respiratory difficulty EXAM: PORTABLE CHEST 1 VIEW COMPARISON:  09/10/2021 FINDINGS: Cardiac shadow is enlarged but stable. Gastric catheter is noted extending into the stomach. Right jugular dialysis catheter is again seen and stable. The lungs show mild left basilar atelectasis. No pneumothorax is seen. The recently placed right midline catheter is not appreciated on this exam. IMPRESSION: Mild left basilar atelectasis.  Electronically Signed   By: Inez Catalina M.D.   On: 09/11/2021 03:05   DG Chest Port 1 View  Result Date: 09/10/2021 CLINICAL DATA:  Shortness of breath and cough. EXAM: PORTABLE CHEST 1 VIEW COMPARISON:  Sep 03, 2021 FINDINGS: There is stable right-sided venous catheter positioning. There is stable moderate to marked severity enlargement of the cardiac silhouette. There is no evidence of acute infiltrate, pleural effusion or pneumothorax the visualized skeletal structures are unremarkable. IMPRESSION: Stable cardiomegaly without evidence of acute or active cardiopulmonary disease. Electronically Signed   By: Virgina Norfolk M.D.   On: 09/10/2021 01:16   DG Abd Portable 1V  Result Date: 09/10/2021 CLINICAL DATA:  [id number].  Feeding  tube placement. EXAM: PORTABLE ABDOMEN - 1 VIEW COMPARISON:  None Available. FINDINGS: Enteric tube coursing below the hemidiaphragm with side port overlying the expected region of the gastric lumen and tip collimated off view. Partially visualized dialysis catheter with tip overlying the right atrium. Visualized lungs demonstrate low lung volumes with airspace opacity-please see separately dictated chest x-ray 09/10/2021 and CT abdomen pelvis 09/10/2021. The visualized bowel gas pattern is normal. No radio-opaque calculi or other significant radiographic abnormality are seen. Atherosclerotic plaque. IMPRESSION: 1. Enteric tube in good position with tip collimated off view. 2.  Aortic Atherosclerosis (ICD10-I70.0). Electronically Signed   By: Iven Finn M.D.   On: 09/10/2021 18:19   ECHOCARDIOGRAM COMPLETE  Result Date: 09/10/2021    ECHOCARDIOGRAM REPORT   Patient Name:   Courtney Gray Date of Exam: 09/10/2021 Medical Rec #:  [id number]       Height:       65.0 in Accession #:    [id number]      Weight:       332.0 lb Date of Birth:  [birth year]        BSA:          2.454 m Patient Age:    64 years        BP:           79/35 mmHg Patient Gender: F               HR:            72 bpm. Exam Location:  Inpatient Procedure: 2D Echo, Cardiac Doppler and Color Doppler Indications:    check for thrombus  History:        Patient has prior history of Echocardiogram examinations, most                 recent 05/17/2021. Stroke; Risk Factors:Diabetes, Dyslipidemia                 and Hypertension. Cath2/14/23.  Sonographer:    Luisa Hart RDCS Referring Phys: [id number] [place]  Sonographer Comments: Technically difficult study due to poor echo windows and patient is morbidly obese. Image acquisition challenging due to patient body habitus. IMPRESSIONS  1. Poor quality study. Globally LV function appears normal.  2. Left ventricular ejection fraction, by estimation, is 50 to 55%. The left ventricle has low normal function. Left ventricular endocardial border not optimally defined to evaluate regional wall motion. There is moderate concentric left ventricular hypertrophy. Left ventricular diastolic parameters are consistent with Grade II diastolic dysfunction (pseudonormalization).  3. Right ventricular systolic function was not well visualized. The right ventricular size is not well visualized. There is mildly elevated pulmonary artery systolic pressure.  4. Left atrial size was mild to moderately dilated.  5. The mitral valve is degenerative. Trivial mitral valve regurgitation. No evidence of mitral stenosis.  6. Tricuspid valve regurgitation is mild to moderate.  7. The aortic valve is tricuspid. Aortic valve regurgitation is not visualized. Mild aortic valve stenosis.  8. The inferior vena cava is normal in size with <50% respiratory variability, suggesting right atrial pressure of 8 mmHg. Comparison(s): No significant change from prior study. FINDINGS  Left Ventricle: Left ventricular ejection fraction, by estimation, is 50 to 55%. The left ventricle has low normal function. Left ventricular endocardial border not optimally defined to evaluate regional wall motion. The left ventricular  internal cavity  size was normal in size. There is moderate concentric left ventricular hypertrophy. Left ventricular diastolic parameters  are consistent with Grade II diastolic dysfunction (pseudonormalization). Right Ventricle: The right ventricular size is not well visualized. Right vetricular wall thickness was not well visualized. Right ventricular systolic function was not well visualized. There is mildly elevated pulmonary artery systolic pressure. The tricuspid regurgitant velocity is 2.79 m/s, and with an assumed right atrial pressure of 8 mmHg, the estimated right ventricular systolic pressure is 50.5 mmHg. Left Atrium: Left atrial size was mild to moderately dilated. Right Atrium: Right atrial size was not well visualized. Pericardium: Trivial pericardial effusion is present. Mitral Valve: The mitral valve is degenerative in appearance. Mild to moderate mitral annular calcification. Trivial mitral valve regurgitation. No evidence of mitral valve stenosis. MV peak gradient, 4.2 mmHg. The mean mitral valve gradient is 2.0 mmHg. Tricuspid Valve: The tricuspid valve is grossly normal. Tricuspid valve regurgitation is mild to moderate. No evidence of tricuspid stenosis. Aortic Valve: The aortic valve is tricuspid. Aortic valve regurgitation is not visualized. Mild aortic stenosis is present. Aortic valve mean gradient measures 12.0 mmHg. Aortic valve peak gradient measures 22.9 mmHg. Aortic valve area, by VTI measures 0.80 cm. Pulmonic Valve: The pulmonic valve was grossly normal. Pulmonic valve regurgitation is not visualized. No evidence of pulmonic stenosis. Aorta: The aortic root and ascending aorta are structurally normal, with no evidence of dilitation. Venous: The inferior vena cava is normal in size with less than 50% respiratory variability, suggesting right atrial pressure of 8 mmHg. IAS/Shunts: The interatrial septum was not well visualized.  LEFT VENTRICLE PLAX 2D LVIDd:         4.85 cm    Diastology LVIDs:         3.90 cm   LV e' medial:    6.20 cm/s LV PW:         1.40 cm   LV E/e' medial:  15.6 LV IVS:        1.55 cm   LV e' lateral:   7.33 cm/s LVOT diam:     1.80 cm   LV E/e' lateral: 13.2 LV SV:         34 LV SV Index:   14 LVOT Area:     2.54 cm  RIGHT VENTRICLE RV S prime:     12.60 cm/s TAPSE (M-mode): 2.4 cm LEFT ATRIUM              Index LA diam:        3.60 cm  1.47 cm/m LA Vol (A2C):   77.6 ml  31.63 ml/m LA Vol (A4C):   116.0 ml 47.28 ml/m LA Biplane Vol: 96.2 ml  39.21 ml/m  AORTIC VALVE                     PULMONIC VALVE AV Area (Vmax):    0.81 cm      PV Vmax:       0.94 m/s AV Area (Vmean):   0.77 cm      PV Vmean:      64.000 cm/s AV Area (VTI):     0.80 cm      PV VTI:        0.187 m AV Vmax:           239.50 cm/s   PV Peak grad:  3.5 mmHg AV Vmean:          160.500 cm/s  PV Mean grad:  2.0 mmHg AV VTI:            0.424 m AV Peak  Grad:      22.9 mmHg AV Mean Grad:      12.0 mmHg LVOT Vmax:         76.00 cm/s LVOT Vmean:        48.500 cm/s LVOT VTI:          0.134 m LVOT/AV VTI ratio: 0.32  AORTA Ao Root diam: 3.00 cm Ao Asc diam:  3.30 cm MITRAL VALVE               TRICUSPID VALVE MV Area (PHT): 2.96 cm    TR Peak grad:   31.1 mmHg MV Area VTI:   0.99 cm    TR Vmax:        279.00 cm/s MV Peak grad:  4.2 mmHg MV Mean grad:  2.0 mmHg    SHUNTS MV Vmax:       1.03 m/s    Systemic VTI:  0.13 m MV Vmean:      74.7 cm/s   Systemic Diam: 1.80 cm MV Decel Time: 256 msec MR Peak grad: 61.3 mmHg MR Mean grad: 38.7 mmHg MR Vmax:      391.33 cm/s MR Vmean:     292.0 cm/s MV E velocity: 96.50 cm/s MV A velocity: 77.10 cm/s MV E/A ratio:  1.25 Eleonore Chiquito MD Electronically signed by Eleonore Chiquito MD Signature Date/Time: 09/10/2021/3:23:32 PM    Final    IR PICC PLACEMENT RIGHT >5 YRS INC IMG GUIDE  Result Date: 09/11/2021 INDICATION: 51 year old female with history of end-stage renal disease, currently with poor venous access. EXAM: 1. Ultrasound-guided vascular access of the right  internal jugular vein. 2. Limited right central venogram. 3. Ultrasound-guided vascular access of the right basilic vein. 4. Placement of right upper extremity midline. MEDICATIONS: None. CONTRAST:  5 mL Omnipaque 300, intravenous FLUOROSCOPY TIME:  6 minutes, 36 seconds, 95.2 mGy COMPLICATIONS: None immediate. TECHNIQUE: The procedure, risks, benefits, and alternatives were explained to the patient and informed written consent was obtained. A timeout was performed prior to the initiation of the procedure. The right neck was prepped and draped in standard fashion. Preprocedure ultrasound evaluation demonstrated partially occluded right internal jugular vein with indwelling right internal jugular vein tunneled hemodialysis catheter. Subdermal Local anesthesia was administered at the planned needle entry site with 1% lidocaine. A small skin nick was made. Under direct ultrasound visualization, a 21 gauge micropuncture was used to puncture the right internal jugular vein. A permanent ultrasound image was captured and stored in the record. A 0.018 inch wire was inserted in met resistance. Under fluoroscopic guidance, the wire was attempted to be navigated around the indwelling hemodialysis catheter which was unsuccessful. 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 The needle was removed and hemostasis was achieved with manual compression. The left neck was examined under ultrasound which demonstrated an occluded left internal jugular vein. There was a patent vein laterally, possibly representing the external jugular vein, however this vein was not visualized clearly communicating with the central veins. Therefore, the decision was made to place right upper extremity midline. This is the same extremity of the abandoned arteriovenous fistula. The right upper extremity was  prepped with chlorhexidine in a sterile fashion, and a sterile drape was applied covering the operative field. Maximum barrier sterile technique with sterile gowns and gloves were used for the procedure. A timeout was performed prior to the initiation of the procedure. Local anesthesia  was provided with 1% lidocaine. Under direct ultrasound guidance, the basilic vein was accessed with a micropuncture kit after the overlying soft tissues were anesthetized with 1% lidocaine. After the overlying soft tissues were anesthetized, a small venotomy incision was created and a micropuncture kit was utilized to access the right basilic vein. Real-time ultrasound guidance was utilized for vascular access including the acquisition of a permanent ultrasound image documenting patency of the accessed vessel. A guidewire was advanced to the level of the axillary vein. The PICC line was cut to 15 cm. A peel-away sheath was placed and a cm, 5 [place], dual lumen was inserted to level of the axillary. A post procedure spot fluoroscopic was obtained. The catheter easily aspirated and flushed and was secured in place with stat lock device. A dressing was applied. The patient tolerated the procedure well without immediate post procedural complication. FINDINGS: Occluded bilateral internal jugular veins. After catheter placement, the tip lies within the right axillary vein the catheter aspirates and flushes normally and is ready for immediate use. IMPRESSION: Successful ultrasound and fluoroscopic guided placement of a right basilic vein approach, 15 cm, 5 [place], dual lumen midline with tip in the right axillary vein. The line is ready for immediate use. Ruthann Cancer, MD [facility] Radiology Electronically Signed   By: Ruthann Cancer M.D.   On: 09/11/2021 08:39    Review of Systems  HENT:  Negative for ear discharge, ear pain, hearing loss and tinnitus.   Eyes:  Negative for photophobia  and pain.  Respiratory:  Negative for cough and shortness of breath.   Cardiovascular:  Negative for chest pain.  Gastrointestinal:  Negative for abdominal pain, nausea and vomiting.  Genitourinary:  Negative for dysuria, flank pain, frequency and urgency.  Musculoskeletal:  Positive for arthralgias (Left long finger). Negative for back pain, myalgias and neck pain.  Neurological:  Negative for dizziness and headaches.  Hematological:  Does not bruise/bleed easily.  Psychiatric/Behavioral:  The patient is not nervous/anxious.   Blood pressure (!) 135/58, pulse 73, temperature 98.3 F (36.8 C), temperature source Axillary, resp. rate 14, height _0  (1.651 m), weight (!) 151.3 kg, SpO2 100 %. Physical Exam Constitutional:      General: She is not in acute distress.    Appearance: She is well-developed. She is not diaphoretic.  HENT:     Head: Normocephalic and atraumatic.  Eyes:     General: No scleral icterus.       Right eye: No discharge.        Left eye: No discharge.     Conjunctiva/sclera: Conjunctivae normal.  Cardiovascular:     Rate and Rhythm: Normal rate and regular rhythm.  Pulmonary:     Effort: Pulmonary effort is normal. No respiratory distress.  Musculoskeletal:     Cervical back: Normal range of motion.     Comments: Left shoulder, elbow, wrist, digits- Ischemic appearing long finger with infarcted area volar tip, absent sensation or cap refill distal to DIP joint, coloration changes P2 but soft with intact sensation, P1 essentially spared, mod TTP P3, no instability, no blocks to motion  Sens  Ax/R/M/U intact  Mot   Ax/ R/ PIN/ M/ AIN/ U intact  Rad 0 but monophasic doppler signal present  Skin:    General: Skin is warm and dry.  Neurological:     Mental Status: She is alert.  Psychiatric:        Mood and Affect: Mood normal.  Behavior: Behavior normal.    Assessment/Plan: Left long finger ischemia -- As finger continues to evolve would hold off on  surgery for now. Will need at least tip amputation in the future. Advise treat pain including gabapentin or similar for the neuropathic component. Will also try some voltaren gel. Will update x-rays. F/u with Dr. Tempie Donning on discharge.    Lisette Abu, PA-C Orthopedic Surgery [phone] 09/11/2021, 9:36 AM   Pt seen and examined independently.  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  Has been evolving over the last several months.  No TTP or discoloration of finger proximal to middle aspect of middle phalanx.  Will plan on elective amputation after extent of necrosis declares itself.  Patient is agreeable and all questions answered.  Please let me know if her exam or the condition of her finger changes acutely during her inpatient stay.   Sherilyn Cooter, M.D. [place]

## 2021-09-11 NOTE — Progress Notes (Signed)
[facility] Progress Note Patient Name: Courtney Gray DOB: 1971-02-04 MRN: [id number]   Date of Service  09/11/2021  HPI/Events of Note  Midline dislodged.  eICU Interventions  Phenylephrine gtt switched to dialysis catheter with the permission of Nephrology pending AM review by IR.        Kerry Kass Katana Berthold 09/11/2021, 3:33 AM

## 2021-09-11 NOTE — TOC Initial Note (Signed)
Transition of Care [facility]) - Initial/Assessment Note    Patient Details  Name: Courtney Gray MRN: [id number] Date of Birth: 28-Dec-1970  Transition of [facility]) CM/SW Contact:    Tom-Johnson, Renea Ee, RN Phone Number: 09/11/2021, 4:51 PM  Clinical Narrative:                  CM spoke with patient and daughter, Courtney Gray at bedside about needs for post hospital transition. Admitted for Splenic Infarction. Currently on 4 L O2. Recent discharge from the hospital.  From home with daughter. Has two children. 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 Has a cane, rollator, w/c, shower seat and home O2 by [employer] at home. Currently on disability. Uses [employer] Transportation to and from appointments.  PCP is Monico Blitz, MD and uses [facility].  Daughter requesting patient discharge to SNF.CM explained to daughter the process of transitioning at discharge and she voiced understanding.  No PT/OT eval noted at this time. CM will continue to follow with needs.    Barriers to Discharge: Continued Medical Work up   Patient Goals and CMS Choice Patient states their goals for this hospitalization and ongoing recovery are:: Daughter requesting patient go to SNF. CMS Medicare.gov Compare Post Acute Care list provided to:: Patient Choice offered to / list presented to : Patient, Adult Children (Daughter, Courtney Gray)  Expected Discharge Plan and Services     Discharge Planning Services: CM Consult Post Acute Care Choice: Durable Medical Equipment Living arrangements for the past 2 months: Apartment                                      Prior Living Arrangements/Services Living arrangements for the past 2 months: Apartment Lives with:: Adult Children ([place], Chartered certified accountant) Patient language and need for interpreter reviewed:: Yes Do you feel safe going back to the place where you live?: Yes      Need for Family Participation in Patient Care: Yes (Comment) Care giver  support system in place?: Yes (comment) Current home services: DME (Cane, w/c, shower sest, rollator, home O2) Criminal Activity/Legal Involvement Pertinent to Current Situation/Hospitalization: No - Comment as needed  Activities of Daily Living      Permission Sought/Granted Permission sought to share information with : Case Manager, Customer service manager, Family Supports Permission granted to share information with : Yes, Verbal Permission Granted              Emotional Assessment Appearance:: Appears stated age Attitude/Demeanor/Rapport: Engaged, Gracious Affect (typically observed): Accepting, Appropriate, Calm, Hopeful Orientation: : Oriented to Self, Oriented to Place, Oriented to  Time, Oriented to Situation Alcohol / Substance Use: Not Applicable Psych Involvement: No (comment)  Admission diagnosis:  Splenic infarction [D73.5] Hypoglycemia [E16.2] ESRD (end stage renal disease) on dialysis ([place]) [N18.6, Z99.2] Hypotension [I95.9] Sepsis, due to unspecified organism, unspecified whether acute organ dysfunction present ([place]) [A41.9] Left upper quadrant abdominal pain [R10.12] Patient Active Problem List   Diagnosis Date Noted   PVD (peripheral vascular disease) ([place])    Hx of AKA (above knee amputation), right ([place])    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   Pneumonia of left lower lobe due to infectious organism 09/10/2021   Lactic acidosis 09/10/2021   Mixed diabetic hyperlipidemia associated with type 2 diabetes mellitus ([place]) 09/10/2021   Coronary artery disease involving native coronary artery of native heart without angina pectoris 09/10/2021   Elevated  troponin level not due myocardial infarction 09/10/2021   Hypotension 09/10/2021   Gangrene of left foot ([place])    Pain 08/03/2021   Ischemic foot pain at rest 08/03/2021   Hypoglycemia 07/10/2021   Volume overload 07/09/2021   Acute pulmonary edema ([place])    Acute gout 06/11/2021   Hyperkalemia 05/21/2021    Hemorrhoids    Hyperlipidemia 05/18/2021   Rectal bleeding 05/17/2021   Morbid obesity with BMI of 60.0-69.9, adult ([place]) 05/17/2021   NSTEMI (non-ST elevated myocardial infarction) ([place]) 05/16/2021   Essential hypertension 09/01/2020   ESRD (end stage renal disease) ([place]) 09/01/2020   Chronic respiratory failure with hypoxia ([place]) 09/01/2020   Hypertensive urgency 11/28/2019   Uncontrolled type 2 diabetes mellitus with hypoglycemia, with long-term current use of insulin ([place]) 11/28/2019   Anemia of chronic disease 11/28/2019   PCP:  Medicine, Ledell Noss Internal Pharmacy:   [facility] [place] [address] [place] [address] Phone: [phone] Fax: [phone]  [facility] [address] [place] [address] Phone: [phone] Fax: [phone]     Social Determinants of Health (SDOH) Interventions    Readmission Risk Interventions    08/28/2021   11:50 AM 07/12/2021   12:38 PM 05/23/2021    1:12 PM  Readmission Risk Prevention Plan  Transportation Screening Complete Complete Complete  Medication Review Press photographer) Complete Complete Complete  PCP or Specialist appointment within 3-5 days of discharge Complete Complete Complete  HRI or Home Care Consult Complete Complete Complete  SW Recovery Care/Counseling Consult Complete Complete Complete  Palliative Care Screening Not Applicable Not Applicable Not [place] Not Applicable Complete Not Applicable

## 2021-09-11 NOTE — Progress Notes (Signed)
ANTICOAGULATION CONSULT NOTE   Pharmacy Consult for Heparin infusion Indication:  splenic infarction  Allergies  Allergen Reactions   Benzonatate Other (See Comments)    Hallucinations   Hydralazine Other (See Comments)    Hallucinations   Amoxicillin Itching and Nausea And Vomiting   Clonidine Other (See Comments)    Altered mental state, insomnia    Chlorhexidine Itching   Morphine Itching   Oxycodone Itching    Pt tolerated hospital admission 07/2021    Patient Measurements: Height: 5\' 5"  (165.1 cm) Weight: (!) 151.3 kg (333 lb 8.9 oz) IBW/kg (Calculated) : 57 Heparin Dosing Weight: 95.1 kg  Vital Signs: Temp: 98.9 F (37.2 C) (06/07 1227) Temp Source: Axillary (06/07 1227) BP: 113/101 (06/07 1330) Pulse Rate: 80 (06/07 1330)  Labs: Recent Labs    09/10/21 0031 09/10/21 0040 09/10/21 0340 09/10/21 0403 09/10/21 0920 09/10/21 1610 09/10/21 1945 09/11/21 0324 09/11/21 1314  HGB 9.5* 11.9*  --   --  8.8*  --   --  9.0*  --   HCT 32.3* 35.0*  --   --  30.9*  --   --  30.4*  --   PLT 384  --   --   --  325  --   --  328  --   LABPROT  --   --   --  16.1*  --   --   --   --   --   INR  --   --   --  1.3*  --   --   --   --   --   HEPARINUNFRC  --   --   --   --   --   --  <0.10* 0.86* <0.10*  CREATININE 10.19* 11.20*  --   --  10.70* 10.95*  --  11.23*  --   TROPONINIHS 33*  --  57*  --   --   --   --   --   --      Estimated Creatinine Clearance: 8.9 mL/min (A) (by C-G formula based on SCr of 11.23 mg/dL (H)).   Medical History: Past Medical History:  Diagnosis Date   Arthritis    Diabetes mellitus without complication ([place])    Dialysis complication, subsequent encounter    Gout    High cholesterol    Hypertension    Renal disorder     Medications:  Medications Prior to Admission  Medication Sig Dispense Refill Last Dose   acetaminophen (TYLENOL) 500 MG tablet Take 1,000 mg by mouth as needed for moderate pain.   09/09/2021   amLODipine (NORVASC)  10 MG tablet Take 1 tablet (10 mg total) by mouth daily. (Patient taking differently: Take 10 mg by mouth daily as needed (Blood pressusre).) 30 tablet 5 unk   aspirin EC 81 MG tablet Take 1 tablet (81 mg total) by mouth daily with breakfast. 30 tablet 11 09/09/2021   atorvastatin (LIPITOR) 10 MG tablet Take 1 tablet (10 mg total) by mouth daily. (Patient taking differently: Take 10 mg by mouth at bedtime.) 30 tablet 5 09/08/2021   calcium carbonate (TUMS SMOOTHIES) 750 MG chewable tablet Chew 1,500 mg by mouth See admin instructions. Take 1500 mg with each meal and 1500 mg with each snack   09/09/2021   carvedilol (COREG) 6.25 MG tablet Take 1 tablet (6.25 mg total) by mouth 2 (two) times daily with a meal. (Patient taking differently: Take 6.25 mg by mouth 2 (two) times daily as  needed (if BP > 150).) 60 tablet 0 Past Week at unk   diphenhydrAMINE (BENADRYL) 25 MG tablet Take 25 mg by mouth 2 (two) times daily as needed for allergies.   unk   docusate sodium (COLACE) 100 MG capsule Take 100 mg by mouth 2 (two) times daily.   09/09/2021   gabapentin (NEURONTIN) 100 MG capsule Take 1 capsule (100 mg total) by mouth 2 (two) times daily. (Patient taking differently: Take 100 mg by mouth See admin instructions. Take 100 mg by mouth twice a day on non dialysis days. On dialysis day take 100 mg in the morning, 200 mg after dialysis, and then 100 mg at bedtime.) 60 capsule 0 09/09/2021   glipiZIDE (GLUCOTROL XL) 2.5 MG 24 hr tablet Take 1 tablet (2.5 mg total) by mouth daily with breakfast. 30 tablet 0 09/09/2021   HYDROcodone-acetaminophen (NORCO/VICODIN) 5-325 MG tablet Take 1-2 tablets by mouth every 4 (four) hours as needed for moderate pain. (Patient taking differently: Take 1 tablet by mouth every 4 (four) hours as needed for moderate pain.) 20 tablet 0 09/08/2021   insulin glargine (LANTUS) 100 UNIT/ML Solostar Pen Inject 12 Units into the skin daily.   Past Week   loperamide (IMODIUM A-D) 2 MG tablet Take 4 mg by  mouth as needed for diarrhea or loose stools.   unk   nitroGLYCERIN (NITROSTAT) 0.4 MG SL tablet Place 1 tablet (0.4 mg total) under the tongue every 5 (five) minutes as needed for chest pain. 10 tablet 3 unk   pantoprazole (PROTONIX) 40 MG tablet Take 1 tablet (40 mg total) by mouth daily. 30 tablet 5 09/09/2021   simethicone (MYLICON) 248 MG chewable tablet Chew 125 mg by mouth in the morning and at bedtime.   09/09/2021   tiZANidine (ZANAFLEX) 4 MG capsule Take 4 mg by mouth in the morning and at bedtime.   09/09/2021   doxycycline (VIBRAMYCIN) 100 MG capsule Take 100 mg by mouth 2 (two) times daily. (Patient not taking: Reported on 09/10/2021)   Completed Course   lisinopril (ZESTRIL) 20 MG tablet Take 1 tablet (20 mg total) by mouth daily. (Patient not taking: Reported on 09/10/2021) 30 tablet 0 Not Taking   traMADol (ULTRAM) 50 MG tablet Take 1 tablet (50 mg total) by mouth every 6 (six) hours as needed for moderate pain. (Patient not taking: Reported on 09/10/2021) 20 tablet 0 Completed Course    Assessment: [name] presented to ED with c/o abdominal pain, constipation, nausea, and vomiting x 1 week. CT abdomen shows splenic infarction. Pharmacy consulted to dose and manage heparin infusion.  Hgb 11.9, Plt 384, INR 1.3 (not on anticoagulation prior to admission)  6/7 PM update: Heparin level is undetectable - running in a midline; had the nurse hold for 5 min, flush and then draw the level.  Level overnight may not have been drawn correctly  Goal of Therapy:  Heparin level 0.3-0.7 units/ml Monitor platelets by anticoagulation protocol: Yes   Plan:  Increase heparin to 1550 units/hr 2230 heparin level  Alanda Slim, PharmD, [facility] Clinical Pharmacist Please see AMION for all Pharmacists' Contact Phone Numbers 09/11/2021, 2:01 PM

## 2021-09-11 NOTE — Procedures (Signed)
Interventional Radiology Procedure Note  Procedure: Placement of a left EJ approach non tunneled triple lumen catheter.  Tip is positioned at the superior cavoatrial junction and catheter is ready for immediate use.  Complications: None Recommendations:  - Ok to use - Do not submerge - Routine line care   Signed,  Dulcy Fanny. Earleen Newport, DO

## 2021-09-12 ENCOUNTER — Inpatient Hospital Stay (HOSPITAL_COMMUNITY): Payer: 59

## 2021-09-12 ENCOUNTER — Other Ambulatory Visit (HOSPITAL_COMMUNITY): Payer: 59

## 2021-09-12 ENCOUNTER — Other Ambulatory Visit (HOSPITAL_COMMUNITY): Payer: Self-pay | Admitting: Radiology

## 2021-09-12 DIAGNOSIS — D735 Infarction of spleen: Secondary | ICD-10-CM | POA: Diagnosis not present

## 2021-09-12 LAB — BASIC METABOLIC PANEL
Anion gap: 16 — ABNORMAL HIGH (ref 5–15)
BUN: 69 mg/dL — ABNORMAL HIGH (ref 6–20)
CO2: 22 mmol/L (ref 22–32)
Calcium: 7.7 mg/dL — ABNORMAL LOW (ref 8.9–10.3)
Chloride: 92 mmol/L — ABNORMAL LOW (ref 98–111)
Creatinine, Ser: 11.84 mg/dL — ABNORMAL HIGH (ref 0.44–1.00)
GFR, Estimated: 4 mL/min — ABNORMAL LOW (ref >60)
Glucose, Bld: 74 mg/dL (ref 70–99)
Potassium: 3.9 mmol/L (ref 3.5–5.1)
Sodium: 130 mmol/L — ABNORMAL LOW (ref 135–145)

## 2021-09-12 LAB — PHOSPHORUS: Phosphorus: 5.9 mg/dL — ABNORMAL HIGH (ref 2.5–4.6)

## 2021-09-12 LAB — CBC
HCT: 27.5 % — ABNORMAL LOW (ref 36.0–46.0)
Hemoglobin: 8.2 g/dL — ABNORMAL LOW (ref 12.0–15.0)
MCH: 27.6 pg (ref 26.0–34.0)
MCHC: 29.8 g/dL — ABNORMAL LOW (ref 30.0–36.0)
MCV: 92.6 fL (ref 80.0–100.0)
Platelets: 274 10*3/uL (ref 150–400)
RBC: 2.97 MIL/uL — ABNORMAL LOW (ref 3.87–5.11)
RDW: 19.2 % — ABNORMAL HIGH (ref 11.5–15.5)
WBC: 34.3 10*3/uL — ABNORMAL HIGH (ref 4.0–10.5)
nRBC: 0 % (ref 0.0–0.2)

## 2021-09-12 LAB — GLUCOSE, CAPILLARY
Glucose-Capillary: 74 mg/dL (ref 70–99)
Glucose-Capillary: 117 mg/dL — ABNORMAL HIGH (ref 70–99)
Glucose-Capillary: 54 mg/dL — ABNORMAL LOW (ref 70–99)
Glucose-Capillary: 111 mg/dL — ABNORMAL HIGH (ref 70–99)
Glucose-Capillary: 79 mg/dL (ref 70–99)
Glucose-Capillary: 74 mg/dL (ref 70–99)
Glucose-Capillary: 77 mg/dL (ref 70–99)
Glucose-Capillary: 75 mg/dL (ref 70–99)
Glucose-Capillary: 61 mg/dL — ABNORMAL LOW (ref 70–99)
Glucose-Capillary: 82 mg/dL (ref 70–99)
Glucose-Capillary: 73 mg/dL (ref 70–99)
Glucose-Capillary: 68 mg/dL — ABNORMAL LOW (ref 70–99)

## 2021-09-12 LAB — HEPARIN LEVEL (UNFRACTIONATED)
Heparin Unfractionated: 0.12 IU/mL — ABNORMAL LOW (ref 0.30–0.70)
Heparin Unfractionated: 0.17 IU/mL — ABNORMAL LOW (ref 0.30–0.70)

## 2021-09-12 LAB — MAGNESIUM: Magnesium: 1.9 mg/dL (ref 1.7–2.4)

## 2021-09-12 LAB — PROCALCITONIN: Procalcitonin: 55.35 ng/mL

## 2021-09-12 MED ORDER — IOHEXOL 350 MG/ML SOLN
100.0000 mL | Freq: Once | INTRAVENOUS | Status: AC | PRN
Start: 2021-09-12 — End: 2021-09-12
  Administered 2021-09-12: 100 mL via INTRAVENOUS

## 2021-09-12 MED ORDER — RENA-VITE PO TABS
1.0000 | ORAL_TABLET | Freq: Every day | ORAL | Status: DC
  Administered 2021-09-13 – 2021-10-20 (×35): 1 via ORAL
  Filled 2021-09-12 (×35): qty 1

## 2021-09-12 MED ORDER — HEPARIN BOLUS VIA INFUSION
2000.0000 [IU] | Freq: Once | INTRAVENOUS | Status: AC
  Administered 2021-09-13: 2000 [IU] via INTRAVENOUS
  Filled 2021-09-12: qty 2000

## 2021-09-12 MED ORDER — METOCLOPRAMIDE HCL 5 MG/ML IJ SOLN
10.0000 mg | Freq: Four times a day (QID) | INTRAMUSCULAR | Status: DC
  Administered 2021-09-12 – 2021-10-02 (×66): 10 mg via INTRAVENOUS
  Filled 2021-09-12 (×69): qty 2

## 2021-09-12 MED ORDER — FENTANYL CITRATE PF 50 MCG/ML IJ SOSY: 25.0000 ug | PREFILLED_SYRINGE | INTRAMUSCULAR | Status: DC | PRN

## 2021-09-12 MED ORDER — DEXTROSE 50 % IV SOLN
INTRAVENOUS | Status: AC
  Filled 2021-09-12: qty 50

## 2021-09-12 MED ORDER — HYDROMORPHONE HCL 1 MG/ML IJ SOLN
0.5000 mg | INTRAMUSCULAR | Status: DC | PRN
Start: 2021-09-12 — End: 2021-09-12

## 2021-09-12 MED ORDER — NEPRO/CARBSTEADY PO LIQD: 237.0000 mL | Freq: Two times a day (BID) | ORAL | Status: DC

## 2021-09-12 MED ORDER — VITAL 1.5 CAL PO LIQD
1000.0000 mL | ORAL | Status: DC
  Administered 2021-09-12 (×2): 1000 mL
  Filled 2021-09-12: qty 1000

## 2021-09-12 MED ORDER — HYDROMORPHONE HCL 1 MG/ML IJ SOLN
0.5000 mg | Freq: Four times a day (QID) | INTRAMUSCULAR | Status: DC | PRN
  Administered 2021-09-12 – 2021-09-13 (×3): 0.5 mg via INTRAVENOUS
  Filled 2021-09-12 (×2): qty 1
  Filled 2021-09-12: qty 0.5

## 2021-09-12 MED ORDER — DEXTROSE 50 % IV SOLN
25.0000 g | INTRAVENOUS | Status: AC
  Administered 2021-09-12: 25 g via INTRAVENOUS

## 2021-09-12 MED ORDER — FENTANYL CITRATE PF 50 MCG/ML IJ SOSY: 12.5000 ug | PREFILLED_SYRINGE | INTRAMUSCULAR | Status: DC | PRN

## 2021-09-12 MED ORDER — HEPARIN BOLUS VIA INFUSION
2000.0000 [IU] | Freq: Once | INTRAVENOUS | Status: AC
  Administered 2021-09-12: 2000 [IU] via INTRAVENOUS
  Filled 2021-09-12: qty 2000

## 2021-09-12 NOTE — Progress Notes (Signed)
Initial Nutrition Assessment  DOCUMENTATION CODES:   Not applicable  INTERVENTION:   - Recommend exchanging NG tube for Cortrak and bridle on next Cortrak service date of 09/13/21  Initiate trickle tube feeds via NG tube: - Vital 1.5 @ 20 ml/hr (480 ml/day)  Trickle tube feeding regimen provides 720 kcal, 32 grams of protein, and 367 ml of H2O (meets 40% of minimum protein needs and 36% of minimum kcal needs).   RD will monitor for ability to advance tube feeds to goal regimen: - Vital 1.5 @ 50 ml/hr (1200 ml/day) - ProSource TF 45 ml BID  Recommended tube feeding regimen at goal rate would provide 1880 kcal, 103 grams of protein, and 917 ml of H2O.  - Trial Nepro Shake po BID, each supplement provides 425 kcal and 19 grams protein  - Renal MVI daily  NUTRITION DIAGNOSIS:   Inadequate oral intake related to nausea as evidenced by per patient/family report.  GOAL:   Patient will meet greater than or equal to 90% of their needs  MONITOR:   PO intake, Supplement acceptance, Labs, Weight trends, Skin, I & O's, TF tolerance  REASON FOR ASSESSMENT:   Consult Enteral/tube feeding initiation and management (trickle tube feeds)  ASSESSMENT:   51 year old female who presented to the ED on 6/06 with abdominal pain, N/V, constipation. PMH of ESRD on HD, gangrene of RLE s/p recent R AKA, CHF, CAD, T2DM, chronic hypoxic respiratory failure on home oxygen, HLD, HTN, gout, severe PAD. Pt admitted with splenic infarction, PNA of LLL, shock.  RD working remotely.  Received consult for initiation of trickle tube feeds. Noted plan for iHD today at bedside. Per CCM, shock improving and pt off levophed today. Pt with ongoing abdominal pain and nausea with minimal PO intake. No meal completions charted. NG tube placed by nursing on 6/06 with side port overlying gastric lumen per abdominal x-ray.  Unable to obtain diet and weight history at this time. Reviewed weight history in chart. Pt  with weight loss over the last 6 months. Overall, pt has lost 17.7 kg (10.5% weight loss) since 04/23/21. However, pt is s/p R AKA on 08/14/21. Weight loss is likely multifactorial and can be attributed to recent amputation as well and volume status given pt with ESRD on HD. Pt's current weight is consistent with EDW listed in Nephrology's notes.  Per nursing documentation, pt with non-pitting edema to BUE and BLE. True dry weight may lower than most recent EDW.  EDW: 151 kg Admit weight: 150.6 kg Current weight: 150.8 kg  Medications reviewed and include: calcitriol, tums 1500 mg TID, sensipar, aranesp, lactulose 30 grams TID, IV reglan 10 mg q 6 hours, protonix, miralax, senna, IV abx, heparin drip IVF: D10 @ 75 ml/hr  Labs reviewed: sodium 130, ionized calcium 0.95, phosphorus 5.5 on 6/6, WBC 34.3, hemoglobin 8.2 CBG's: 54-117 x 24 hours  I/O's: +4.6 L since admit  NUTRITION - FOCUSED PHYSICAL EXAM:  Unable to complete at this time. RD working remotely.  Diet Order:   Diet Order             Diet renal with fluid restriction Fluid restriction: 1200 mL Fluid; Room service appropriate? Yes; Fluid consistency: Thin  Diet effective now                   EDUCATION NEEDS:   Not appropriate for education at this time  Skin:  Skin Assessment: Skin Integrity Issues: Stage II: bilateral buttocks Incisions: RLE s/p AKA Other:  wound to L hand finger  Last BM:  09/12/21  Height:   Ht Readings from Last 1 Encounters:  09/10/21 5\' 5"  (1.651 m)    Weight:   Wt Readings from Last 1 Encounters:  09/12/21 (!) 150.8 kg    Ideal Body Weight:  52.3 kg (adjusted for R AKA)  BMI:  Body mass index is 55.32 kg/m.  Estimated Nutritional Needs:   Kcal:  1800-2000  Protein:  90-110 grams  Fluid:  1000 ml + UOP    Gustavus Bryant, MS, RD, LDN Inpatient Clinical Dietitian Please see AMiON for contact information.

## 2021-09-12 NOTE — Progress Notes (Addendum)
  Progress Note    09/12/2021 7:29 AM  Subjective:  no complaints  afebrile  Vitals:   09/12/21 0600 09/12/21 0615  BP: (!) 139/55 139/71  Pulse: 69 69  Resp: 17 12  Temp:    SpO2: 100% 100%    Physical Exam: General:  resting comfortably Lungs:  non labored Incisions:  right AKA stump looks fine with staples in tact Extremities:  LLE is warm and well perfused with motor and sensory in tact   CBC    Component Value Date/Time   WBC 34.3 (H) 09/12/2021 0225   RBC 2.97 (L) 09/12/2021 0225   HGB 8.2 (L) 09/12/2021 0225   HCT 27.5 (L) 09/12/2021 0225   PLT 274 09/12/2021 0225   MCV 92.6 09/12/2021 0225   MCH 27.6 09/12/2021 0225   MCHC 29.8 (L) 09/12/2021 0225   RDW 19.2 (H) 09/12/2021 0225   LYMPHSABS 0.8 09/11/2021 0324   MONOABS 2.0 (H) 09/11/2021 0324   EOSABS 0.0 09/11/2021 0324   BASOSABS 0.1 09/11/2021 0324    BMET    Component Value Date/Time   NA 130 (L) 09/12/2021 0225   K 3.9 09/12/2021 0225   CL 92 (L) 09/12/2021 0225   CO2 22 09/12/2021 0225   GLUCOSE 74 09/12/2021 0225   BUN 69 (H) 09/12/2021 0225   CREATININE 11.84 (H) 09/12/2021 0225   CALCIUM 7.7 (L) 09/12/2021 0225   GFRNONAA 4 (L) 09/12/2021 0225   GFRAA 6 (L) 11/29/2019 0120    INR    Component Value Date/Time   INR 1.3 (H) 09/10/2021 0403     Intake/Output Summary (Last 24 hours) at 09/12/2021 0729 Last data filed at 09/12/2021 0600 Gross per 24 hour  Intake 1616.63 ml  Output --  Net 1616.63 ml     Assessment/Plan:  51 y.o. female is s/p:  S/p right AKA 08/14/2021 by Dr. Carlis Abbott      -pt right AKA continues to look fine with staples in tact.  LLE continues to be warm and well perfused with motor and sensory in tact.  -CTA chest ordered a couple of days ago but not yet done.  Awaiting results to rule out embolizing proximal aortic lesion. Echo revealed no evidence of thrombus.  -ortho evaluated pt yesterday and recommend holding on surgery for now but will need at the least tip  amputation and will f/u with Dr. Tempie Donning at discharge.  -pt afebrile and leukocytosis improved today at 34k.  Do not feel her right AKA stump is source for leukocytosis.  -DVT prophylaxis:  heparin gtt   Leontine Locket, PA-C [facility] [phone] 09/12/2021 7:29 AM  I have seen and evaluated the patient. I agree with the PA note as documented above. Right AKA looks ok.  Will remove staples next week.  Hand surgery consult for left long finger ischemia and recommend outpatient follow-up.  Awaiting CTA Chest for embolic work-up given splenic infarcts.  Echo with no evidence of thrombus.  Marty Heck, MD [facility] [place] Office: [phone]

## 2021-09-12 NOTE — Progress Notes (Signed)
NAME:  Courtney Gray, MRN:  [id number], DOB:  31-Oct-1970, LOS: 2 ADMISSION DATE:  09/09/2021, CONSULTATION DATE:  6/6 REFERRING MD:  Dr. Pietro Cassis, CHIEF COMPLAINT:  Abdominal Pain    History of Present Illness:  51 y/o F who presented to [facility] ER on 6/5 with reports of abdominal pain, constipation and decreased appetite.    She was admitted from 4/29-5/24 with initial complaints of right foot pain.  She was found to have an ischemic foot with concern for gangrene.  Patient underwent a right AKA.  Additional findings during that hospitalization included an ulceration on the left middle finger and left second toe pain with concern for compromised perfusion (noted single-vessel peroneal runoff and concern for worsening symptoms in left foot).  VVS informed her that she does not have any revascularization options.  X-rays of her hand at that time did not show osteomyelitis.  There were difficulties with disposition at that time that required involvement of TOC for discharge.  She returned to the ER on 5/30 with reports of weakness, fatigue. Wound was clean. She received HD, & work up was negative at that time & she was discharged home.    6/5 with reports of abdominal pain.  The patient reported one week of pain, constipation, nausea and emesis.  She has been on doxycycline for finger infection.  She had fever to 101.6. Her glucose was low and she was treated with IV dextrose.  She also reported LLE toe pain.  Given abdominal pain, CT of the abdomen was obtained which raised question of indistinctness along the margin of the spleen with concern for possible splenic hemorrhage or injury.  After discussion with Radiology, it was recommended to obtain CT with contrast for better images. CTA of the abdomen / pelvis showed hypoenhancing bands in the spleen with a small amount of perisplenic free fluid, suggesting infarcts, trace pleural effusion with atelectasis or infiltrate. Her initial labs showed Na+ 134, K+ 3.3,  Cl 97, CO2 19, glucose 75, BUN 54, Sr Cr 10.70, AG 18, Phos 5.5, albumin 2.5, lactic acid 3.7, WBC 36, Hgb 8.8, and platelets 325. CXR without infiltrate. She had low blood pressure readings with BP assessment on wrist but normal mental status.  Unfortunately, she had very limited IV access.  Nephrology was consulted for HD.  Her baseline is TTS.   PCCM consulted for evaluation of blood pressure and difficult IV access.   Pertinent  Medical History  DM II  HTN HLD  ESRD on HD  Gout   Significant Hospital Events: Including procedures, antibiotic start and stop dates in addition to other pertinent events   6/5 Admit with abdominal pain, work up concerning for splenic infarct 6/8 off pressors since early AM around 0100  Interim History / Subjective:  Vitals stable. Has ongoing belly pain, unchanged from prior. For HD today. Off pressors since 0100  Objective   Blood pressure (!) 145/67, pulse 70, temperature 97.8 F (36.6 C), temperature source Axillary, resp. rate 13, height 5\' 5"  (1.651 m), weight (!) 150.8 kg, SpO2 100 %.        Intake/Output Summary (Last 24 hours) at 09/12/2021 0924 Last data filed at 09/12/2021 0900 Gross per 24 hour  Intake 1639.65 ml  Output --  Net 1639.65 ml    Filed Weights   09/10/21 0900 09/10/21 2100 09/12/21 0615  Weight: (!) 150.6 kg (!) 151.3 kg (!) 150.8 kg    Examination: General: chronically ill appearing adult female, sleeping and resting comfortably,  in NAD HEENT: MM pink/moist, EOMI Lungs: normal effort. CTAB Cardiovascular: RRR, no m/r/g Abdomen: BS x 4, protuberant, large panus, tender to deep palpation Extremities: warm/dry, R AKA with staples intact, no drainage. L hand with wound to tip of  middle finger (see photo below)   Neuro: AAOx3, speech clear, MAE  Assessment & Plan:   Fever - unclear etiology though currently afebrile; however, WBC 34 (slight improvement) and PCT 55. May be from splenic process, less likely LLL as is  more likely atelectasis.  Other differential to consider include surgical site but largely clean without drainage, left finger wound & constipation (as source of fever).  -follow cultures -continue empiric abx given degree of leukocytosis (cefepime/vanco) -Follow PCT -bowel regimen -PRN tylenol   Splenic Infarction -Continue heparin infusion -follow abd exam   Hypotension - resolved, off pressors Hx HTN, HLD  -continue lipitor  -hold home norvasc, coreg, lisinopril  ESRD on HD TTS -Nephrology following, appreciate the assistance. For HD today -Trend BMP / urinary output  PVD s/p right AKA early May 2023 -VVS following, appreciate the assistance -VVS checking CTA chest to r/o embolizing proximal aortic lesion -elevate stump, keep clean / dry  Difficult IV Access - L EJ CVL placed by IR 6/8. - Maintain TLC line given difficulty with access  Chronic Hypoxic Respiratory Failure  Suspected OHS -O2 as needed to support sats 90-95% -consider outpatient sleep study  DM II with Hypoglycemia  -hold home agents for now   Constipation  -bowel regimen  Left Hand Infection - complicated by ischemia/compromised flow. UE arterial duplex 08/11/21 with possible 50-74% stenosis in distal radial artery/multiphasic flow noted by vascular surgery -hold further doxycycline as currently broadened out to cefepime/vanc -Hand surgery recommending conservative care for now with outpatient follow up. Ultimately will eventually require at least tip amputation -Continue gabapentin for neuropathic pain   Stable for transfer out of ICU.  Will ask TRH to assume care in AM 6/9 with PCCM off.  Best Practice (right click and "Reselect all SmartList Selections" daily)   Diet/type: Regular consistency (see orders) DVT prophylaxis: systemic heparin GI prophylaxis: PPI Lines: Dialysis Catheter, CVL Foley:  N/A Code Status:  full code Last date of multidisciplinary goals of care discussion:  Patient and  daughter updated on plan of care.   Montey Hora, [facility] For pager details, please see AMION or use Epic chat  After 1900, please call [place] for cross coverage needs 09/12/2021, 9:24 AM

## 2021-09-12 NOTE — Progress Notes (Addendum)
ANTICOAGULATION CONSULT NOTE   Pharmacy Consult for Heparin infusion Indication:  splenic infarction  Allergies  Allergen Reactions   Benzonatate Other (See Comments)    Hallucinations   Hydralazine Other (See Comments)    Hallucinations   Amoxicillin Itching and Nausea And Vomiting   Clonidine Other (See Comments)    Altered mental state, insomnia    Chlorhexidine Itching   Morphine Itching   Oxycodone Itching    Pt tolerated hospital admission 07/2021    Patient Measurements: Height: 5\' 5"  (165.1 cm) Weight: (!) 150.8 kg (332 lb 7.3 oz) IBW/kg (Calculated) : 57 Heparin Dosing Weight: 95.1 kg  Vital Signs: Temp: 97.8 F (36.6 C) (06/08 0751) Temp Source: Axillary (06/08 0751) BP: 145/67 (06/08 0900) Pulse Rate: 70 (06/08 0900)  Labs: Recent Labs    09/10/21 0031 09/10/21 0040 09/10/21 0340 09/10/21 0403 09/10/21 0920 09/10/21 1610 09/10/21 1945 09/11/21 0324 09/11/21 1314 09/11/21 2210 09/12/21 0225 09/12/21 0846  HGB 9.5*   < >  --   --  8.8*  --   --  9.0*  --   --  8.2*  --   HCT 32.3*   < >  --   --  30.9*  --   --  30.4*  --   --  27.5*  --   PLT 384  --   --   --  325  --   --  328  --   --  274  --   LABPROT  --   --   --  16.1*  --   --   --   --   --   --   --   --   INR  --   --   --  1.3*  --   --   --   --   --   --   --   --   HEPARINUNFRC  --   --   --   --   --   --    < > 0.86* <0.10* <0.10*  --  0.12*  CREATININE 10.19*   < >  --   --  10.70* 10.95*  --  11.23*  --   --  11.84*  --   TROPONINIHS 33*  --  57*  --   --   --   --   --   --   --   --   --    < > = values in this interval not displayed.     Estimated Creatinine Clearance: 8.4 mL/min (A) (by C-G formula based on SCr of 11.84 mg/dL (H)).   Medical History: Past Medical History:  Diagnosis Date   Arthritis    Diabetes mellitus without complication ([place])    Dialysis complication, subsequent encounter    Gout    High cholesterol    Hypertension    Renal disorder      Medications:  Medications Prior to Admission  Medication Sig Dispense Refill Last Dose   acetaminophen (TYLENOL) 500 MG tablet Take 1,000 mg by mouth as needed for moderate pain.   09/09/2021   amLODipine (NORVASC) 10 MG tablet Take 1 tablet (10 mg total) by mouth daily. (Patient taking differently: Take 10 mg by mouth daily as needed (Blood pressusre).) 30 tablet 5 unk   aspirin EC 81 MG tablet Take 1 tablet (81 mg total) by mouth daily with breakfast. 30 tablet 11 09/09/2021   atorvastatin (LIPITOR) 10 MG tablet Take 1  tablet (10 mg total) by mouth daily. (Patient taking differently: Take 10 mg by mouth at bedtime.) 30 tablet 5 09/08/2021   calcium carbonate (TUMS SMOOTHIES) 750 MG chewable tablet Chew 1,500 mg by mouth See admin instructions. Take 1500 mg with each meal and 1500 mg with each snack   09/09/2021   carvedilol (COREG) 6.25 MG tablet Take 1 tablet (6.25 mg total) by mouth 2 (two) times daily with a meal. (Patient taking differently: Take 6.25 mg by mouth 2 (two) times daily as needed (if BP > 150).) 60 tablet 0 Past Week at unk   diphenhydrAMINE (BENADRYL) 25 MG tablet Take 25 mg by mouth 2 (two) times daily as needed for allergies.   unk   docusate sodium (COLACE) 100 MG capsule Take 100 mg by mouth 2 (two) times daily.   09/09/2021   gabapentin (NEURONTIN) 100 MG capsule Take 1 capsule (100 mg total) by mouth 2 (two) times daily. (Patient taking differently: Take 100 mg by mouth See admin instructions. Take 100 mg by mouth twice a day on non dialysis days. On dialysis day take 100 mg in the morning, 200 mg after dialysis, and then 100 mg at bedtime.) 60 capsule 0 09/09/2021   glipiZIDE (GLUCOTROL XL) 2.5 MG 24 hr tablet Take 1 tablet (2.5 mg total) by mouth daily with breakfast. 30 tablet 0 09/09/2021   HYDROcodone-acetaminophen (NORCO/VICODIN) 5-325 MG tablet Take 1-2 tablets by mouth every 4 (four) hours as needed for moderate pain. (Patient taking differently: Take 1 tablet by mouth every 4  (four) hours as needed for moderate pain.) 20 tablet 0 09/08/2021   insulin glargine (LANTUS) 100 UNIT/ML Solostar Pen Inject 12 Units into the skin daily.   Past Week   loperamide (IMODIUM A-D) 2 MG tablet Take 4 mg by mouth as needed for diarrhea or loose stools.   unk   nitroGLYCERIN (NITROSTAT) 0.4 MG SL tablet Place 1 tablet (0.4 mg total) under the tongue every 5 (five) minutes as needed for chest pain. 10 tablet 3 unk   pantoprazole (PROTONIX) 40 MG tablet Take 1 tablet (40 mg total) by mouth daily. 30 tablet 5 09/09/2021   simethicone (MYLICON) 947 MG chewable tablet Chew 125 mg by mouth in the morning and at bedtime.   09/09/2021   tiZANidine (ZANAFLEX) 4 MG capsule Take 4 mg by mouth in the morning and at bedtime.   09/09/2021   doxycycline (VIBRAMYCIN) 100 MG capsule Take 100 mg by mouth 2 (two) times daily. (Patient not taking: Reported on 09/10/2021)   Completed Course   lisinopril (ZESTRIL) 20 MG tablet Take 1 tablet (20 mg total) by mouth daily. (Patient not taking: Reported on 09/10/2021) 30 tablet 0 Not Taking   traMADol (ULTRAM) 50 MG tablet Take 1 tablet (50 mg total) by mouth every 6 (six) hours as needed for moderate pain. (Patient not taking: Reported on 09/10/2021) 20 tablet 0 Completed Course    Assessment: [name] presented to ED with c/o abdominal pain, constipation, nausea, and vomiting x 1 week. CT abdomen shows splenic infarction. Pharmacy consulted to dose and manage heparin infusion.  Hgb 11.9, Plt 384, INR 1.3 (not on anticoagulation prior to admission)  6/8 AM update: Heparin level is subtherapeutic - running in a midline; had the nurse hold for 5 min, flush and then draw the level.   Goal of Therapy:  Heparin level 0.3-0.7 units/ml Monitor platelets by anticoagulation protocol: Yes   Plan:  Heparin 2000 units IV x 1  Increase heparin to 2300 units/hr 1800 heparin level  Alanda Slim, PharmD, [facility] Clinical Pharmacist Please see AMION for all Pharmacists' Contact  Phone Numbers 09/12/2021, 9:29 AM

## 2021-09-12 NOTE — Progress Notes (Signed)
Patient ID: Courtney Gray, female   DOB: 11/22/1970, 51 y.o.   MRN: [id number] [facility] Progress Note   Assessment/ Plan:   1.  Abdominal pain/splenic infarct: Ongoing source identification of embolic source contributing to her splenic infarct with ongoing pain control and on heparin drip. 2. ESRD: She is usually on a TTS dialysis schedule and had her last hemodialysis treatment on Saturday (5 days ago).  We have been limited in dialysis delivery as she was hypotensive and dialysis catheter was being used for Neo-Synephrine infusion until yesterday.  Blood pressures have improved overnight and she is off pressors and I have ordered for hemodialysis at bedside in the ICU today.. 3. Anemia: Low hemoglobin/hematocrit noted, will continue ESA 4. CKD-MBD: Resume phosphorus binders when back on reliable oral intake/diet.  Calcium level acceptable when corrected for albumin.  On Hectorol for PTH suppression. 5.  Peripheral vascular disease status post right above-knee amputation: She had amputation earlier last month by vascular surgery and wound stump appears to be clean/dry with intact staples.  Plans noted by vascular surgery for staple removal as an outpatient. 6.  Fever/hypotension/leukocytosis: Currently suspected to be associated with her splenic infarct and has been evaluated by vascular surgery for integrity of recent amputation stump.  On antimicrobial coverage with cefepime and vancomycin and now off pressors.   Subjective:   With intermittent abdominal discomfort overnight, appetite still poor.  Off of pressors.   Objective:   BP (!) 145/67   Pulse 70   Temp 97.8 F (36.6 C) (Axillary)   Resp 13   Ht 5\' 5"  (1.651 m)   Wt (!) 150.8 kg   SpO2 100%   BMI 55.32 kg/m   Physical Exam: Gen: Comfortably resting in bed, awakens to conversation/calling her name, NGT in situ CVS: Pulse regular rhythm, normal rate, S1 and S2 normal Resp: Diminished breath sounds over bases,  poor inspiratory effort.  No distinct rales or rhonchi.  Right IJ TDC in place. Abd: Soft, obese, tender over upper quadrants Ext: Status post right above-knee amputation with intact staples over stump, 1+ left lower extremity edema with duskiness of toes noted.  Labs: BMET Recent Labs  Lab 09/10/21 0031 09/10/21 0040 09/10/21 0920 09/10/21 1610 09/11/21 0324 09/12/21 0225  NA 135 135 134* 135 132* 130*  K 3.4* 3.3* 3.3* 4.1 4.4 3.9  CL 95* 96* 97* 94* 94* 92*  CO2 24  --  19* 21* 22 22  GLUCOSE 42* 39* 75 <20* 132* 74  BUN 49* 48* 54* 57* 60* 69*  CREATININE 10.19* 11.20* 10.70* 10.95* 11.23* 11.84*  CALCIUM 8.4*  --  8.0* 8.2* 7.9* 7.7*  PHOS  --   --  5.5*  --   --   --    CBC Recent Labs  Lab 09/10/21 0031 09/10/21 0040 09/10/21 0920 09/11/21 0324 09/12/21 0225  WBC 14.2*  --  36.0* 44.3* 34.3*  NEUTROABS 13.1*  --   --  39.5*  --   HGB 9.5* 11.9* 8.8* 9.0* 8.2*  HCT 32.3* 35.0* 30.9* 30.4* 27.5*  MCV 95.0  --  97.2 94.1 92.6  PLT 384  --  325 328 274      Medications:     atorvastatin  10 mg Oral QHS   calcitRIOL  1 mcg Oral Q T,Th,Sa-HD   calcium carbonate  1,500 mg Oral TID with meals   Chlorhexidine Gluconate Cloth  6 each Topical Q0600   cinacalcet  30 mg Oral Q T,Th,Sa-HD  darbepoetin (ARANESP) injection - DIALYSIS  200 mcg Intravenous Q Thu-HD   diclofenac Sodium  2 g Topical QID   gabapentin  100 mg Oral BID   gabapentin  200 mg Oral Q T,Th,Sa-HD   Gerhardt's butt cream   Topical BID   lactulose  30 g Oral TID   midodrine  10 mg Oral Q8H   pantoprazole  40 mg Oral Daily   polyethylene glycol  17 g Oral Daily   senna-docusate  1 tablet Oral BID   sodium chloride flush  10-40 mL Intracatheter Q12H   sodium chloride flush  3 mL Intravenous Q12H   Elmarie Shiley, MD 09/12/2021, 9:03 AM

## 2021-09-12 NOTE — Progress Notes (Signed)
Pt's NGT dislodged while in CT. NGT will need to be reinserted. Unable to reinsert at this time d/t pt discomfort. Provider and RD notified via secure chat.

## 2021-09-12 NOTE — Progress Notes (Signed)
ANTICOAGULATION CONSULT NOTE   Pharmacy Consult for Heparin infusion Indication:  splenic infarction  Allergies  Allergen Reactions   Benzonatate Other (See Comments)    Hallucinations   Hydralazine Other (See Comments)    Hallucinations   Amoxicillin Itching and Nausea And Vomiting   Clonidine Other (See Comments)    Altered mental state, insomnia    Chlorhexidine Itching   Morphine Itching   Oxycodone Itching    Pt tolerated hospital admission 07/2021    Patient Measurements: Height: 5\' 5"  (165.1 cm) Weight: (!) 150.8 kg (332 lb 7.3 oz) IBW/kg (Calculated) : 57 Heparin Dosing Weight: 95.1 kg  Vital Signs: Temp: 98.3 F (36.8 C) (06/08 1945) Temp Source: Oral (06/08 1945) BP: 141/58 (06/08 1945) Pulse Rate: 71 (06/08 1945)  Labs: Recent Labs    09/10/21 0031 09/10/21 0040 09/10/21 0340 09/10/21 0403 09/10/21 0920 09/10/21 1610 09/10/21 1945 09/11/21 0324 09/11/21 1314 09/11/21 2210 09/12/21 0225 09/12/21 0846 09/12/21 2050  HGB 9.5*   < >  --   --  8.8*  --   --  9.0*  --   --  8.2*  --   --   HCT 32.3*   < >  --   --  30.9*  --   --  30.4*  --   --  27.5*  --   --   PLT 384  --   --   --  325  --   --  328  --   --  274  --   --   LABPROT  --   --   --  16.1*  --   --   --   --   --   --   --   --   --   INR  --   --   --  1.3*  --   --   --   --   --   --   --   --   --   HEPARINUNFRC  --   --   --   --   --   --    < > 0.86*   < > <0.10*  --  0.12* 0.17*  CREATININE 10.19*   < >  --   --  10.70* 10.95*  --  11.23*  --   --  11.84*  --   --   TROPONINIHS 33*  --  57*  --   --   --   --   --   --   --   --   --   --    < > = values in this interval not displayed.     Estimated Creatinine Clearance: 8.4 mL/min (A) (by C-G formula based on SCr of 11.84 mg/dL (H)).   Medical History: Past Medical History:  Diagnosis Date   Arthritis    Diabetes mellitus without complication ([place])    Dialysis complication, subsequent encounter    Gout    High  cholesterol    Hypertension    Renal disorder     Medications:  Medications Prior to Admission  Medication Sig Dispense Refill Last Dose   acetaminophen (TYLENOL) 500 MG tablet Take 1,000 mg by mouth as needed for moderate pain.   09/09/2021   amLODipine (NORVASC) 10 MG tablet Take 1 tablet (10 mg total) by mouth daily. (Patient taking differently: Take 10 mg by mouth daily as needed (Blood pressusre).) 30 tablet 5 unk   aspirin EC  81 MG tablet Take 1 tablet (81 mg total) by mouth daily with breakfast. 30 tablet 11 09/09/2021   atorvastatin (LIPITOR) 10 MG tablet Take 1 tablet (10 mg total) by mouth daily. (Patient taking differently: Take 10 mg by mouth at bedtime.) 30 tablet 5 09/08/2021   calcium carbonate (TUMS SMOOTHIES) 750 MG chewable tablet Chew 1,500 mg by mouth See admin instructions. Take 1500 mg with each meal and 1500 mg with each snack   09/09/2021   carvedilol (COREG) 6.25 MG tablet Take 1 tablet (6.25 mg total) by mouth 2 (two) times daily with a meal. (Patient taking differently: Take 6.25 mg by mouth 2 (two) times daily as needed (if BP > 150).) 60 tablet 0 Past Week at unk   diphenhydrAMINE (BENADRYL) 25 MG tablet Take 25 mg by mouth 2 (two) times daily as needed for allergies.   unk   docusate sodium (COLACE) 100 MG capsule Take 100 mg by mouth 2 (two) times daily.   09/09/2021   gabapentin (NEURONTIN) 100 MG capsule Take 1 capsule (100 mg total) by mouth 2 (two) times daily. (Patient taking differently: Take 100 mg by mouth See admin instructions. Take 100 mg by mouth twice a day on non dialysis days. On dialysis day take 100 mg in the morning, 200 mg after dialysis, and then 100 mg at bedtime.) 60 capsule 0 09/09/2021   glipiZIDE (GLUCOTROL XL) 2.5 MG 24 hr tablet Take 1 tablet (2.5 mg total) by mouth daily with breakfast. 30 tablet 0 09/09/2021   HYDROcodone-acetaminophen (NORCO/VICODIN) 5-325 MG tablet Take 1-2 tablets by mouth every 4 (four) hours as needed for moderate pain. (Patient  taking differently: Take 1 tablet by mouth every 4 (four) hours as needed for moderate pain.) 20 tablet 0 09/08/2021   insulin glargine (LANTUS) 100 UNIT/ML Solostar Pen Inject 12 Units into the skin daily.   Past Week   loperamide (IMODIUM A-D) 2 MG tablet Take 4 mg by mouth as needed for diarrhea or loose stools.   unk   nitroGLYCERIN (NITROSTAT) 0.4 MG SL tablet Place 1 tablet (0.4 mg total) under the tongue every 5 (five) minutes as needed for chest pain. 10 tablet 3 unk   pantoprazole (PROTONIX) 40 MG tablet Take 1 tablet (40 mg total) by mouth daily. 30 tablet 5 09/09/2021   simethicone (MYLICON) 025 MG chewable tablet Chew 125 mg by mouth in the morning and at bedtime.   09/09/2021   tiZANidine (ZANAFLEX) 4 MG capsule Take 4 mg by mouth in the morning and at bedtime.   09/09/2021   doxycycline (VIBRAMYCIN) 100 MG capsule Take 100 mg by mouth 2 (two) times daily. (Patient not taking: Reported on 09/10/2021)   Completed Course   lisinopril (ZESTRIL) 20 MG tablet Take 1 tablet (20 mg total) by mouth daily. (Patient not taking: Reported on 09/10/2021) 30 tablet 0 Not Taking   traMADol (ULTRAM) 50 MG tablet Take 1 tablet (50 mg total) by mouth every 6 (six) hours as needed for moderate pain. (Patient not taking: Reported on 09/10/2021) 20 tablet 0 Completed Course    Assessment: [name] presented to ED with c/o abdominal pain, constipation, nausea, and vomiting x 1 week. CT abdomen shows splenic infarction. Pharmacy consulted to dose and manage heparin infusion.  Hgb 11.9, Plt 384, INR 1.3 (not on anticoagulation prior to admission)  Heparin came back low again this PM. We will give a small bolus and increase rate again. Check level in AM.  Goal of Therapy:  Heparin level 0.3-0.7 units/ml Monitor platelets by anticoagulation protocol: Yes   Plan:  Heparin 2000 units IV x 1 Increase heparin to 2500 units/hr HL in AM  Onnie Boer, PharmD, [place], AAHIVP, CPP Infectious Disease Pharmacist 09/12/2021  9:24 PM

## 2021-09-13 ENCOUNTER — Encounter (HOSPITAL_COMMUNITY)
Admission: EM | Disposition: A | Discharge status: Skilled Nursing Facility | Payer: Self-pay | Source: Home / Self Care | Attending: Internal Medicine

## 2021-09-13 DIAGNOSIS — I739 Peripheral vascular disease, unspecified: Secondary | ICD-10-CM

## 2021-09-13 DIAGNOSIS — L8990 Pressure ulcer of unspecified site, unspecified stage: Secondary | ICD-10-CM | POA: Insufficient documentation

## 2021-09-13 DIAGNOSIS — J9611 Chronic respiratory failure with hypoxia: Secondary | ICD-10-CM | POA: Diagnosis not present

## 2021-09-13 DIAGNOSIS — R778 Other specified abnormalities of plasma proteins: Secondary | ICD-10-CM | POA: Diagnosis not present

## 2021-09-13 DIAGNOSIS — D735 Infarction of spleen: Secondary | ICD-10-CM | POA: Diagnosis not present

## 2021-09-13 DIAGNOSIS — N186 End stage renal disease: Secondary | ICD-10-CM | POA: Diagnosis not present

## 2021-09-13 DIAGNOSIS — I959 Hypotension, unspecified: Secondary | ICD-10-CM

## 2021-09-13 DIAGNOSIS — Z89611 Acquired absence of right leg above knee: Secondary | ICD-10-CM

## 2021-09-13 LAB — GLUCOSE, CAPILLARY
Glucose-Capillary: 100 mg/dL — ABNORMAL HIGH (ref 70–99)
Glucose-Capillary: 100 mg/dL — ABNORMAL HIGH (ref 70–99)
Glucose-Capillary: 103 mg/dL — ABNORMAL HIGH (ref 70–99)
Glucose-Capillary: 125 mg/dL — ABNORMAL HIGH (ref 70–99)
Glucose-Capillary: 146 mg/dL — ABNORMAL HIGH (ref 70–99)
Glucose-Capillary: 172 mg/dL — ABNORMAL HIGH (ref 70–99)
Glucose-Capillary: 35 mg/dL — CL (ref 70–99)
Glucose-Capillary: 85 mg/dL (ref 70–99)

## 2021-09-13 LAB — CBC
HCT: 27.3 % — ABNORMAL LOW (ref 36.0–46.0)
Hemoglobin: 8.3 g/dL — ABNORMAL LOW (ref 12.0–15.0)
MCH: 27.9 pg (ref 26.0–34.0)
MCHC: 30.4 g/dL (ref 30.0–36.0)
MCV: 91.9 fL (ref 80.0–100.0)
Platelets: 203 10*3/uL (ref 150–400)
RBC: 2.97 MIL/uL — ABNORMAL LOW (ref 3.87–5.11)
RDW: 19.4 % — ABNORMAL HIGH (ref 11.5–15.5)
WBC: 26 10*3/uL — ABNORMAL HIGH (ref 4.0–10.5)
nRBC: 0 % (ref 0.0–0.2)

## 2021-09-13 LAB — PROCALCITONIN: Procalcitonin: 41.45 ng/mL

## 2021-09-13 LAB — BASIC METABOLIC PANEL
Anion gap: 10 (ref 5–15)
BUN: 40 mg/dL — ABNORMAL HIGH (ref 6–20)
CO2: 26 mmol/L (ref 22–32)
Calcium: 7.5 mg/dL — ABNORMAL LOW (ref 8.9–10.3)
Chloride: 94 mmol/L — ABNORMAL LOW (ref 98–111)
Creatinine, Ser: 7.85 mg/dL — ABNORMAL HIGH (ref 0.44–1.00)
GFR, Estimated: 6 mL/min — ABNORMAL LOW (ref >60)
Glucose, Bld: 104 mg/dL — ABNORMAL HIGH (ref 70–99)
Potassium: 3.5 mmol/L (ref 3.5–5.1)
Sodium: 130 mmol/L — ABNORMAL LOW (ref 135–145)

## 2021-09-13 LAB — MAGNESIUM: Magnesium: 1.6 mg/dL — ABNORMAL LOW (ref 1.7–2.4)

## 2021-09-13 LAB — HEPARIN LEVEL (UNFRACTIONATED)
Heparin Unfractionated: 0.21 IU/mL — ABNORMAL LOW (ref 0.30–0.70)
Heparin Unfractionated: 0.36 IU/mL (ref 0.30–0.70)

## 2021-09-13 LAB — PHOSPHORUS: Phosphorus: 3.6 mg/dL (ref 2.5–4.6)

## 2021-09-13 SURGERY — ECHOCARDIOGRAM, TRANSESOPHAGEAL
Anesthesia: Monitor Anesthesia Care

## 2021-09-13 MED ORDER — HEPARIN BOLUS VIA INFUSION
2500.0000 [IU] | Freq: Once | INTRAVENOUS | Status: AC
  Administered 2021-09-13: 2500 [IU] via INTRAVENOUS
  Filled 2021-09-13: qty 2500

## 2021-09-13 MED ORDER — GABAPENTIN 100 MG PO CAPS
200.0000 mg | ORAL_CAPSULE | ORAL | Status: DC
  Administered 2021-09-17 – 2021-10-19 (×11): 200 mg via ORAL
  Filled 2021-09-13 (×16): qty 2

## 2021-09-13 MED ORDER — GABAPENTIN 250 MG/5ML PO SOLN: 200.0000 mg | ORAL | Status: DC

## 2021-09-13 MED ORDER — VANCOMYCIN HCL IN DEXTROSE 1-5 GM/200ML-% IV SOLN
1000.0000 mg | INTRAVENOUS | Status: AC
  Administered 2021-09-13: 1000 mg via INTRAVENOUS
  Filled 2021-09-13: qty 200

## 2021-09-13 MED ORDER — GABAPENTIN 250 MG/5ML PO SOLN
100.0000 mg | Freq: Two times a day (BID) | ORAL | Status: DC
  Filled 2021-09-13: qty 2

## 2021-09-13 MED ORDER — GABAPENTIN 100 MG PO CAPS
100.0000 mg | ORAL_CAPSULE | Freq: Two times a day (BID) | ORAL | Status: DC
  Administered 2021-09-13 – 2021-10-21 (×68): 100 mg via ORAL
  Filled 2021-09-13 (×69): qty 1

## 2021-09-13 MED ORDER — HYDROMORPHONE HCL 2 MG PO TABS
2.0000 mg | ORAL_TABLET | ORAL | Status: DC | PRN
  Administered 2021-09-14 – 2021-10-12 (×68): 2 mg via ORAL
  Filled 2021-09-13 (×73): qty 1

## 2021-09-13 MED ORDER — BOOST / RESOURCE BREEZE PO LIQD CUSTOM
1.0000 | Freq: Three times a day (TID) | ORAL | Status: DC
Start: 2021-09-13 — End: 2021-10-21
  Administered 2021-09-13 – 2021-10-21 (×33): 1 via ORAL

## 2021-09-13 MED ORDER — HYDROMORPHONE HCL 1 MG/ML IJ SOLN
0.5000 mg | Freq: Four times a day (QID) | INTRAMUSCULAR | Status: DC | PRN
  Administered 2021-09-14 – 2021-09-16 (×4): 0.5 mg via INTRAVENOUS
  Filled 2021-09-13 (×4): qty 1

## 2021-09-13 NOTE — Progress Notes (Signed)
Discussed with patient her reports that CHG causes her to itch.  I learned from her it is only the CHG 6-Cloth baths that cause her itching not the CHG used to care for her IV/HD sites.  She was agree able to continue to allow use of CHG to care for the IV/HD sites.   Richardean Canal RN, BSN, CCRN-K

## 2021-09-13 NOTE — Progress Notes (Signed)
[facility] Progress Note   Subjective:    Seen and examined patient at bedside. Now off pressors and appears patient downgraded to 6E. She reported vomiting during HD overnight causing treatment to stop an hour early. Also reported low Bps during HD. Noted net UF 1.8L. Currently denies SOB, CP, and N/V.   Objective Vitals:   09/12/21 2352 09/13/21 0222 09/13/21 0300 09/13/21 0332  BP: (!) 154/55  (!) 147/61 (!) 140/46  Pulse:   (!) 103   Resp: 11  17 17   Temp:  98.2 F (36.8 C) 98.5 F (36.9 C) 98.5 F (36.9 C)  TempSrc:  Oral Oral Oral  SpO2:  98%    Weight:    (!) 150.3 kg  Height:       Physical Exam General: Ill-appearing; on 4L via [place]; NAD Heart:S1 and S2; No murmurs, gallops, or rubs Lungs: Clear anteriorly and diminished laterally; distant air movement Abdomen: Large, soft, and non-tender, NGT in place Extremities: Trace edema LLE; R AKA Dialysis Access: R [facility]   Filed Weights   09/12/21 0615 09/12/21 2159 09/13/21 0332  Weight: (!) 150.8 kg (!) 146.7 kg (!) 150.3 kg    Intake/Output Summary (Last 24 hours) at 09/13/2021 0835 Last data filed at 09/13/2021 0420 Gross per 24 hour  Intake 3136.83 ml  Output 1800 ml  Net 1336.83 ml    Additional Objective Labs: Basic Metabolic Panel: Recent Labs  Lab 09/10/21 0920 09/10/21 1610 09/11/21 0324 09/12/21 0225 09/12/21 2050  NA 134* 135 132* 130*  --   K 3.3* 4.1 4.4 3.9  --   CL 97* 94* 94* 92*  --   CO2 19* 21* 22 22  --   GLUCOSE 75 <20* 132* 74  --   BUN 54* 57* 60* 69*  --   CREATININE 10.70* 10.95* 11.23* 11.84*  --   CALCIUM 8.0* 8.2* 7.9* 7.7*  --   PHOS 5.5*  --   --   --  5.9*   Liver Function Tests: Recent Labs  Lab 09/10/21 0031 09/10/21 0920 09/11/21 0324  AST 18  --  30  ALT 11  --  11  ALKPHOS 79  --  76  BILITOT 0.7  --  0.8  PROT 7.8  --  7.1  ALBUMIN 2.8* 2.5* 2.4*   Recent Labs  Lab 09/10/21 0031  LIPASE 37   CBC: Recent Labs  Lab 09/10/21 0031  09/10/21 0040 09/10/21 0920 09/11/21 0324 09/12/21 0225 09/13/21 0751  WBC 14.2*  --  36.0* 44.3* 34.3* 26.0*  NEUTROABS 13.1*  --   --  39.5*  --   --   HGB 9.5*   < > 8.8* 9.0* 8.2* 8.3*  HCT 32.3*   < > 30.9* 30.4* 27.5* 27.3*  MCV 95.0  --  97.2 94.1 92.6 91.9  PLT 384  --  325 328 274 203   < > = values in this interval not displayed.   Blood Culture    Component Value Date/Time   SDES BLOOD RIGHT FOREARM 09/10/2021 1600   SPECREQUEST  09/10/2021 1600    BOTTLES DRAWN AEROBIC ONLY Blood Culture results may not be optimal due to an inadequate volume of blood received in culture bottles   CULT  09/10/2021 1600    NO GROWTH 2 DAYS Performed at [facility], [place] [address], [place], [place] [address]    REPTSTATUS PENDING 09/10/2021 1600    Cardiac Enzymes: No results for input(s): "CKTOTAL", "CKMB", "  CKMBINDEX", "TROPONINI" in the last 168 hours. CBG: Recent Labs  Lab 09/12/21 2209 09/13/21 0008 09/13/21 0201 09/13/21 0408 09/13/21 0642  GLUCAP 68* 85 100* 35* 103*   Iron Studies: No results for input(s): "IRON", "TIBC", "TRANSFERRIN", "FERRITIN" in the last 72 hours. Lab Results  Component Value Date   INR 1.3 (H) 09/10/2021   INR 1.3 (H) 07/10/2021   Studies/Results: DG Abd 1 View  Result Date: 09/12/2021 CLINICAL DATA:  Nasogastric tube placement EXAM: ABDOMEN - 1 VIEW COMPARISON:  09/10/2021 FINDINGS: One view of the lower chest and upper abdomen. One view of the upper abdomen. Nasogastric tube is followed to the level of the gastric body, not visualized more distally. Expected limitations secondary to patient morbid obesity. No gross free intraperitoneal air. Cardiomegaly with probable layering bilateral pleural effusions. Dialysis catheter tip at low SVC. Left subclavian line tip at high SVC. IMPRESSION: Although the nasogastric tube is difficult to follow distally, likely terminates at the gastric body. Electronically Signed   By: Abigail Miyamoto M.D.   On:  09/12/2021 19:48   CT ANGIO CHEST AORTA W/CM & OR WO/CM  Addendum Date: 09/12/2021   ADDENDUM REPORT: 09/12/2021 16:34 ADDENDUM: These results were called by telephone at the time of interpretation on 09/12/2021 at 4:33 pm to provider Dr. Loanne Drilling, who verbally acknowledged these results. Electronically Signed   By: Ronney Asters M.D.   On: 09/12/2021 16:34   Result Date: 09/12/2021 CLINICAL DATA:  Evaluate for embolism.  Chest pain. EXAM: CT ANGIOGRAPHY CHEST WITH CONTRAST TECHNIQUE: Multidetector CT imaging of the chest was performed using the standard protocol during bolus administration of intravenous contrast. Multiplanar CT image reconstructions and MIPs were obtained to evaluate the vascular anatomy. RADIATION DOSE REDUCTION: This exam was performed according to the departmental dose-optimization program which includes automated exposure control, adjustment of the mA and/or kV according to patient size and/or use of iterative reconstruction technique. CONTRAST:  160m OMNIPAQUE IOHEXOL 350 MG/ML SOLN COMPARISON:  None Available. FINDINGS: Cardiovascular: Satisfactory opacification of the pulmonary arteries to the segmental level. No evidence of pulmonary embolism. Heart is enlarged. No pericardial effusion. There are atherosclerotic calcifications of the aorta and coronary arteries. Right-sided central venous catheter tip is in the right atrium. Left-sided central venous catheter tip is at the brachiocephalic SVC junction. Mediastinum/Nodes: No enlarged mediastinal, hilar, or axillary lymph nodes. Esophagus is nondilated. Enteric tube tip is in the upper thoracic esophagus near the level of the thoracic inlet. Visualized thyroid gland is within normal limits. Lungs/Pleura: There is a small amount of atelectasis and airspace disease in the inferior left lower lobe. There are trace bilateral pleural effusions. There are atelectatic changes in the right middle lobe and right lower lobe. No evidence for  pneumothorax. Trachea and central airways are patent. Upper Abdomen: No acute abnormality. Musculoskeletal: No chest wall abnormality. No acute or significant osseous findings. Review of the MIP images confirms the above findings. IMPRESSION: 1. No evidence for pulmonary embolism. 2. Small amount of atelectasis and airspace disease in the left lower lobe worrisome for infection. 3. Trace bilateral pleural effusions. 4. Enteric tube tip is in the proximal thoracic esophagus. Recommend repositioning. Aortic Atherosclerosis (ICD10-I70.0). Electronically Signed: By: ARonney AstersM.D. On: 09/12/2021 16:20   IR Fluoro Guide CV Line Left  Result Date: 09/12/2021 INDICATION: 51year old female referred for central venous catheter EXAM: IMAGE GUIDED PLACEMENT OF LEFT EXTERNAL JUGULAR VEIN CENTRAL VENOUS CATHETER MEDICATIONS: None ANESTHESIA/SEDATION: None FLUOROSCOPY: Radiation Exposure Index (as provided by the fluoroscopic  device): 11 mGy Kerma COMPLICATIONS: None PROCEDURE: Informed written consent was obtained from the patient's family after a discussion of the risks, benefits, and alternatives to treatment. Questions regarding the procedure were encouraged and answered. The right neck was prepped with chlorhexidine in a sterile fashion, and a sterile drape was applied covering the operative field. Maximum barrier sterile technique with sterile gowns and gloves were used for the procedure. A timeout was performed prior to the initiation of the procedure. A micropuncture kit was utilized to access the left external jugular vein under direct, real-time ultrasound guidance after the overlying soft tissues were anesthetized with 1% lidocaine with epinephrine. Ultrasound image documentation was performed. Guidewire was advanced to the level of the IVC. A non tunneled standard triple-lumen catheter was placed. Final catheter positioning was confirmed and documented with a spot radiographic image. The catheter aspirates and  flushes normally. Dressings were applied. The patient tolerated the procedure well without immediate post procedural complication. IMPRESSION: Status post image guided placement left external jugular vein central venous catheter. Signed, Dulcy Fanny. Dellia Nims, RPVI [facility] Radiology Electronically Signed   By: Corrie Mckusick D.O.   On: 09/12/2021 08:29   IR US Guide Vasc Access Left  Result Date: 09/12/2021 INDICATION: 51 year old female referred for central venous catheter EXAM: IMAGE GUIDED PLACEMENT OF LEFT EXTERNAL JUGULAR VEIN CENTRAL VENOUS CATHETER MEDICATIONS: None ANESTHESIA/SEDATION: None FLUOROSCOPY: Radiation Exposure Index (as provided by the fluoroscopic device): 11 mGy Kerma COMPLICATIONS: None PROCEDURE: Informed written consent was obtained from the patient's family after a discussion of the risks, benefits, and alternatives to treatment. Questions regarding the procedure were encouraged and answered. The right neck was prepped with chlorhexidine in a sterile fashion, and a sterile drape was applied covering the operative field. Maximum barrier sterile technique with sterile gowns and gloves were used for the procedure. A timeout was performed prior to the initiation of the procedure. A micropuncture kit was utilized to access the left external jugular vein under direct, real-time ultrasound guidance after the overlying soft tissues were anesthetized with 1% lidocaine with epinephrine. Ultrasound image documentation was performed. Guidewire was advanced to the level of the IVC. A non tunneled standard triple-lumen catheter was placed. Final catheter positioning was confirmed and documented with a spot radiographic image. The catheter aspirates and flushes normally. Dressings were applied. The patient tolerated the procedure well without immediate post procedural complication. IMPRESSION: Status post image guided placement left external jugular  vein central venous catheter. Signed, Dulcy Fanny. Dellia Nims, RPVI [facility] Radiology Electronically Signed   By: Corrie Mckusick D.O.   On: 09/12/2021 08:29   DG Finger Middle Left  Result Date: 09/11/2021 CLINICAL DATA:  [id number], ischemic ulcer at the tip of the left middle finger for a few weeks, evaluate for osteomyelitis EXAM: LEFT MIDDLE FINGER three views COMPARISON:  None Available. FINDINGS: The phalanges with attention of the distal phalanx of the middle finger have a normal appearance. Mild arthropathy and is more prominent at the distal interphalangeal joint. There is a small flat soft tissue defect at the pulp of the middle finger of likely known ulcer. There is mild tapering seen at the tip of the middle finger. Vascular calcifications. IMPRESSION: Phalanges of the middle finger with attention to the distal phalanx have a normal appearance without evidence of osteomyelitis. Mild arthropathy and is most prominent at the DIP. Mild tapering of the pulp of the middle finger. Vascular calcifications. Electronically Signed  By: Frazier Richards M.D.   On: 09/11/2021 10:17    Medications:  sodium chloride Stopped (09/13/21 0341)   ceFEPime (MAXIPIME) IV 200 mL/hr at 09/13/21 0420   dextrose 75 mL/hr at 09/13/21 0744   feeding supplement (VITAL 1.5 CAL) 20 mL/hr at 09/13/21 0420   heparin 2,500 Units/hr (09/13/21 0446)   vancomycin Stopped (09/10/21 1706)    atorvastatin  10 mg Oral QHS   calcitRIOL  1 mcg Oral Q T,Th,Sa-HD   calcium carbonate  1,500 mg Oral TID with meals   Chlorhexidine Gluconate Cloth  6 each Topical Q0600   cinacalcet  30 mg Oral Q T,Th,Sa-HD   darbepoetin (ARANESP) injection - DIALYSIS  200 mcg Intravenous Q Thu-HD   diclofenac Sodium  2 g Topical QID   feeding supplement (NEPRO CARB STEADY)  237 mL Oral BID BM   gabapentin  100 mg Oral Q12H   [START ON 09/14/2021] gabapentin  200 mg Oral Q T,Th,Sa-HD   Gerhardt's butt  cream   Topical BID   lactulose  30 g Oral TID   metoCLOPramide (REGLAN) injection  10 mg Intravenous Q6H   midodrine  10 mg Oral Q8H   multivitamin  1 tablet Oral QHS   pantoprazole  40 mg Oral Daily   polyethylene glycol  17 g Oral Daily   senna-docusate  1 tablet Oral BID   sodium chloride flush  10-40 mL Intracatheter Q12H   sodium chloride flush  3 mL Intravenous Q12H    Dialysis Orders: [name]TS 4h 91mn  151 kg  1K/2.5Ca bath  TDC  400 BFR - 4/29 > HBsAg neg w/ Abs < 10 Hep 2,600 load and 1,500 hourly  - mircera 200ug q2 due 09/10/21 - calcitriol 1 ug tiw - sensipar 30 mg tiw  Assessment/Plan: ESRD HD TTS: now back on routine schedule. Noted pt didn't tolerated HD overnight d/t vomiting and low Bps. Monitor trends closely. Continue Midodrine. Next HD 6/10. Abdominal pain splenic infarct on CT: continue current pain management and heparin drip. Anemia of ESRD /acute illness-Hgb 8.3 Continue Aranesp 2622mcg Metabolic bone disease -PO4 now at goal. Plan to start binders once patient can tolerate solids. PAD status post right AKA/ LLE PAD= VVS following. Plan for staple removal in outpatient. Diabetes mellitus type 2= per admitting  CTobie Poet NP CManly6/12/2021,8:35 AM  LOS: 3 days

## 2021-09-13 NOTE — Progress Notes (Signed)
Progress Note  Patient: Courtney Gray QQV:[id number] DOB: [birth year]  DOA: 09/09/2021  DOS: 09/13/2021    Brief hospital course: Glynnis Gavel is a 51 y.o. 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 She was hypoglycemic, febrile with CT revealing hypoenhancement in the spleen consistent with infarct.   Assessment and Plan: Distributive shock with lactic acidosis: Pt has gangrene of left third digit and splenic infarcts which are the likely culprit for presentation. No definite nidus of infection currently noted.  - Continuing empiric abx for at least 5 days with severity of illness. WBC improving, but still 26k.  - Weaned from pressors 6/8. Continue midodrine. Will add hold parameters. - Monitor blood cultures.   IDT2DM uncontrolled with hypoglycemia: HbA1c 5.7% and recurrently hypoglycemic here.  - Continue D10 gtt and close monitoring.  - Encourage po intake. D/w pt and daughter. We removed NGT w/trickle feeds with plans for cor trak which the patient has declined.  - Hold insulins. Glucagon prn  Splenic infarct: No embolic source noted on echo or CT's. - Heparin gtt - Pain control. DC fentanyl. Change dilaudid to po.   Severe PAD s/p right AKA: Significant vascular compromise of LLE as well.  - VVS consulted, planning outpatient suture removal 6/13, will see here if still admitted. Recommended monitoring and protected LLE as this would be at high risk for amputation if wounded. - Continue statin, anticoagulation. Can restart ASA once more stable.  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  - Hand surgery consulted, recommending outpatient follow up to allow for evolutionary changes prior to amputation.  - CT chest showed no embolizing proximal aortic  lesions. - Vascular surgery stated she has multiphasic flow per recent duplex - Pain control: Gabapentin, tylenol, dilaudid  Chronic hypoxic respiratory failure, suspected OHS, OSA:  - Continue supplemental O2 to maintain SpO2 90-95% to avoid risk of hypercarbia. Continue BiPAP qHS. - Atelectasis noted at LLL, needs to be instructed and encouraged on use of IS.   ESRD:  - Back on TTS scheduled for HD per nephrology.  - Continue midodrine as above to support BP during dialysis. Limited session 6/8.   Poor IV access: s/p left EJ triple lumen catheter placed by IR 6/7.   Morbid obesity: Body mass index is 55.14 kg/m.   HTN: Hold home BP meds, actually requiring midodrine after levophed wean.  - May need to reinitiate antihypertensive Tx once BPs rise.   Demand myocardial ischemia: Mild troponin elevation without angina.  - Defer further work up at this time.   Anemia of ESRD:  - Defer to nephrology whether ESA should be held.  HLD:  - Continue statin  Constipation: LBM 6/8 - Continue regular bowel regimen.   Stage 2 left and buttocks pressure injuries, POA:  - Offload as able.   Needs to start PT/OT  Subjective: Had pain in the left middle finger, pain at right AKA is minimal. Abdominal pain overall improved. She's most irritated by NG tube, hasn't had an appetite but not having nausea or vomiting. Had BM yesterday.   Objective: Vitals:   09/12/21 2352 09/13/21 0222 09/13/21 0300 09/13/21 0332  BP: (!) 154/55  (!) 147/61 (!) 140/46  Pulse:   (!) 103   Resp: 11  17 17  Temp:  98.2 F (36.8 C) 98.5 F (36.9 C) 98.5 F (36.9 C)  TempSrc:  Oral Oral Oral  SpO2:  98%    Weight:    (!) 150.3 kg  Height:       Gen: Chronically ill-appearing morbidly obese 51 y.o. female in no distress Pulm: Nonlabored breathing supplemental oxygen, no crackles or wheezes. CV: Regular rate and rhythm. Flor systolic, soft murmur at base no rub, or gallop. UTD JVD, no pitting dependent  edema. GI: Abdomen with large pannus, tender to deep palpation with normoactive bowel sounds.  Ext: R AKA as pictured elsewhere has no dehiscence or erythema or exudate w/staples in place. LLE with diminished pulses, no open wounds. Left 3rd finger with distal, volar ischemic necrotic change and proximal dry gangrene, tender along mid finger, no sensation distally. No exudate or odor Skin: R HD catheter in IJ, left EJ catheter both w/dressings c/d/I without bleeding or exudate. Neuro: Drowsy but rousable without focal neurological deficits. Psych: Judgement and insight appear marginal.    Data Personally reviewed: CBC: Recent Labs  Lab 09/10/21 0031 09/10/21 0040 09/10/21 0920 09/11/21 0324 09/12/21 0225 09/13/21 0751  WBC 14.2*  --  36.0* 44.3* 34.3* 26.0*  NEUTROABS 13.1*  --   --  39.5*  --   --   HGB 9.5* 11.9* 8.8* 9.0* 8.2* 8.3*  HCT 32.3* 35.0* 30.9* 30.4* 27.5* 27.3*  MCV 95.0  --  97.2 94.1 92.6 91.9  PLT 384  --  325 328 274 762   Basic Metabolic Panel: Recent Labs  Lab 09/10/21 0920 09/10/21 1610 09/11/21 0324 09/12/21 0225 09/12/21 2050 09/13/21 0751  NA 134* 135 132* 130*  --  130*  K 3.3* 4.1 4.4 3.9  --  3.5  CL 97* 94* 94* 92*  --  94*  CO2 19* 21* 22 22  --  26  GLUCOSE 75 <20* 132* 74  --  104*  BUN 54* 57* 60* 69*  --  40*  CREATININE 10.70* 10.95* 11.23* 11.84*  --  7.85*  CALCIUM 8.0* 8.2* 7.9* 7.7*  --  7.5*  MG  --   --  1.8  --  1.9 1.6*  PHOS 5.5*  --   --   --  5.9* 3.6   GFR: Estimated Creatinine Clearance: 12.6 mL/min (A) (by C-G formula based on SCr of 7.85 mg/dL (H)). Liver Function Tests: Recent Labs  Lab 09/10/21 0031 09/10/21 0920 09/11/21 0324  AST 18  --  30  ALT 11  --  11  ALKPHOS 79  --  76  BILITOT 0.7  --  0.8  PROT 7.8  --  7.1  ALBUMIN 2.8* 2.5* 2.4*   Recent Labs  Lab 09/10/21 0031  LIPASE 37   No results for input(s): "AMMONIA" in the last 168 hours. Coagulation Profile: Recent Labs  Lab 09/10/21 0403   INR 1.3*   Cardiac Enzymes: No results for input(s): "CKTOTAL", "CKMB", "CKMBINDEX", "TROPONINI" in the last 168 hours. BNP (last 3 results) No results for input(s): "PROBNP" in the last 8760 hours. HbA1C: No results for input(s): "HGBA1C" in the last 72 hours. CBG: Recent Labs  Lab 09/13/21 0201 09/13/21 0408 09/13/21 0642 09/13/21 0858 09/13/21 1138  GLUCAP 100* 35* 103* 125* 172*   Lipid Profile: No results for input(s): "CHOL", "HDL", "LDLCALC", "TRIG", "CHOLHDL", "LDLDIRECT" in the last 72 hours. Thyroid Function Tests: No results for input(s): "TSH", "T4TOTAL", "FREET4", "T3FREE", "THYROIDAB" in the last 72 hours. Anemia Panel: No results  for input(s): "VITAMINB12", "FOLATE", "FERRITIN", "TIBC", "IRON", "RETICCTPCT" in the last 72 hours. Urine analysis: No results found for: "COLORURINE", "APPEARANCEUR", "LABSPEC", "PHURINE", "GLUCOSEU", "HGBUR", "BILIRUBINUR", "KETONESUR", "PROTEINUR", "UROBILINOGEN", "NITRITE", "LEUKOCYTESUR" Recent Results (from the past 240 hour(s))  SARS Coronavirus 2 by RT PCR (hospital order, performed in [facility] hospital lab) *cepheid single result test* Anterior Nasal Swab     Status: None   Collection Time: 09/04/21  6:52 AM   Specimen: Anterior Nasal Swab  Result Value Ref Range Status   SARS Coronavirus 2 by RT PCR NEGATIVE NEGATIVE Final    Comment: (NOTE) SARS-CoV-2 target nucleic acids are NOT DETECTED.  The SARS-CoV-2 RNA is generally detectable in upper and lower respiratory specimens during the acute phase of infection. The lowest concentration of SARS-CoV-2 viral copies this assay can detect is 250 copies / mL. A negative result does not preclude SARS-CoV-2 infection and should not be used as the sole basis for treatment or other patient management decisions.  A negative result may occur with improper specimen collection / handling, submission of specimen other than nasopharyngeal swab, presence of viral mutation(s) within  the areas targeted by this assay, and inadequate number of viral copies (<250 copies / mL). A negative result must be combined with clinical observations, patient history, and epidemiological information.  Fact Sheet for Patients:   https://www.patel.info/  Fact Sheet for Healthcare Providers: https://hall.com/  This test is not yet approved or  cleared by the [place] FDA and has been authorized for detection and/or diagnosis of SARS-CoV-2 by FDA under an Emergency Use Authorization (EUA).  This EUA will remain in effect (meaning this test can be used) for the duration of the COVID-19 declaration under Section 564(b)(1) of the Act, 21 U.S.C. section 360bbb-3(b)(1), unless the authorization is terminated or revoked sooner.  Performed at [facility], [address], [place], [place] [address]   Culture, blood (routine x 2)     Status: None (Preliminary result)   Collection Time: 09/10/21  5:15 AM   Specimen: BLOOD LEFT HAND  Result Value Ref Range Status   Specimen Description BLOOD LEFT HAND  Final   Special Requests   Final    BOTTLES DRAWN AEROBIC ONLY Blood Culture results may not be optimal due to an inadequate volume of blood received in culture bottles   Culture   Final    NO GROWTH 3 DAYS Performed at [facility], [place] [address], [place], [place] [address]    Report Status PENDING  Incomplete  Culture, blood (routine x 2)     Status: None (Preliminary result)   Collection Time: 09/10/21  4:00 PM   Specimen: BLOOD RIGHT FOREARM  Result Value Ref Range Status   Specimen Description BLOOD RIGHT FOREARM  Final   Special Requests   Final    BOTTLES DRAWN AEROBIC ONLY Blood Culture results may not be optimal due to an inadequate volume of blood received in culture bottles   Culture   Final    NO GROWTH 3 DAYS Performed at [facility], [place] [address], [place], [place] [address]    Report Status PENDING   Incomplete  MRSA Next Gen by PCR, Nasal     Status: None   Collection Time: 09/10/21  5:36 PM   Specimen: Nasal Mucosa; Nasal Swab  Result Value Ref Range Status   MRSA by PCR Next Gen NOT DETECTED NOT DETECTED Final    Comment: (NOTE) The GeneXpert MRSA Assay (FDA approved for NASAL  specimens only), is one component of a comprehensive MRSA colonization surveillance program. It is not intended to diagnose MRSA infection nor to guide or monitor treatment for MRSA infections. Test performance is not FDA approved in patients less than 38 years old. Performed at [facility], [place] [address] [place], [place] [address]      DG Abd 1 View  Result Date: 09/12/2021 CLINICAL DATA:  Nasogastric tube placement EXAM: ABDOMEN - 1 VIEW COMPARISON:  09/10/2021 FINDINGS: One view of the lower chest and upper abdomen. One view of the upper abdomen. Nasogastric tube is followed to the level of the gastric body, not visualized more distally. Expected limitations secondary to patient morbid obesity. No gross free intraperitoneal air. Cardiomegaly with probable layering bilateral pleural effusions. Dialysis catheter tip at low SVC. Left subclavian line tip at high SVC. IMPRESSION: Although the nasogastric tube is difficult to follow distally, likely terminates at the gastric body. Electronically Signed   By: Abigail Miyamoto M.D.   On: 09/12/2021 19:48   CT ANGIO CHEST AORTA W/CM & OR WO/CM  Addendum Date: 09/12/2021   ADDENDUM REPORT: 09/12/2021 16:34 ADDENDUM: These results were called by telephone at the time of interpretation on 09/12/2021 at 4:33 pm to provider Dr. Loanne Drilling, who verbally acknowledged these results. Electronically Signed   By: Ronney Asters M.D.   On: 09/12/2021 16:34   Result Date: 09/12/2021 CLINICAL DATA:  Evaluate for embolism.  Chest pain. EXAM: CT ANGIOGRAPHY CHEST WITH CONTRAST TECHNIQUE: Multidetector CT imaging of the chest was performed using the standard protocol during bolus  administration of intravenous contrast. Multiplanar CT image reconstructions and MIPs were obtained to evaluate the vascular anatomy. RADIATION DOSE REDUCTION: This exam was performed according to the departmental dose-optimization program which includes automated exposure control, adjustment of the mA and/or kV according to patient size and/or use of iterative reconstruction technique. CONTRAST:  185m OMNIPAQUE IOHEXOL 350 MG/ML SOLN COMPARISON:  None Available. FINDINGS: Cardiovascular: Satisfactory opacification of the pulmonary arteries to the segmental level. No evidence of pulmonary embolism. Heart is enlarged. No pericardial effusion. There are atherosclerotic calcifications of the aorta and coronary arteries. Right-sided central venous catheter tip is in the right atrium. Left-sided central venous catheter tip is at the brachiocephalic SVC junction. Mediastinum/Nodes: No enlarged mediastinal, hilar, or axillary lymph nodes. Esophagus is nondilated. Enteric tube tip is in the upper thoracic esophagus near the level of the thoracic inlet. Visualized thyroid gland is within normal limits. Lungs/Pleura: There is a small amount of atelectasis and airspace disease in the inferior left lower lobe. There are trace bilateral pleural effusions. There are atelectatic changes in the right middle lobe and right lower lobe. No evidence for pneumothorax. Trachea and central airways are patent. Upper Abdomen: No acute abnormality. Musculoskeletal: No chest wall abnormality. No acute or significant osseous findings. Review of the MIP images confirms the above findings. IMPRESSION: 1. No evidence for pulmonary embolism. 2. Small amount of atelectasis and airspace disease in the left lower lobe worrisome for infection. 3. Trace bilateral pleural effusions. 4. Enteric tube tip is in the proximal thoracic esophagus. Recommend repositioning. Aortic Atherosclerosis (ICD10-I70.0). Electronically Signed: By: ARonney AstersM.D. On:  09/12/2021 16:20   IR Fluoro Guide CV Line Left  Result Date: 09/12/2021 INDICATION: 51year old female referred for central venous catheter EXAM: IMAGE GUIDED PLACEMENT OF LEFT EXTERNAL JUGULAR VEIN CENTRAL VENOUS CATHETER MEDICATIONS: None ANESTHESIA/SEDATION: None FLUOROSCOPY: Radiation Exposure Index (as provided by the fluoroscopic device): 11 mGy Kerma COMPLICATIONS: None PROCEDURE:  Informed written consent was obtained from the patient's family after a discussion of the risks, benefits, and alternatives to treatment. Questions regarding the procedure were encouraged and answered. The right neck was prepped with chlorhexidine in a sterile fashion, and a sterile drape was applied covering the operative field. Maximum barrier sterile technique with sterile gowns and gloves were used for the procedure. A timeout was performed prior to the initiation of the procedure. A micropuncture kit was utilized to access the left external jugular vein under direct, real-time ultrasound guidance after the overlying soft tissues were anesthetized with 1% lidocaine with epinephrine. Ultrasound image documentation was performed. Guidewire was advanced to the level of the IVC. A non tunneled standard triple-lumen catheter was placed. Final catheter positioning was confirmed and documented with a spot radiographic image. The catheter aspirates and flushes normally. Dressings were applied. The patient tolerated the procedure well without immediate post procedural complication. IMPRESSION: Status post image guided placement left external jugular vein central venous catheter. Signed, Dulcy Fanny. Dellia Nims, RPVI [facility]iology Electronically Signed   By: Corrie Mckusick D.O.   On: 09/12/2021 08:29   IR US Guide Vasc Access Left  Result Date: 09/12/2021 INDICATION: 51 year old female referred for central venous catheter EXAM: IMAGE GUIDED PLACEMENT OF LEFT EXTERNAL JUGULAR VEIN  CENTRAL VENOUS CATHETER MEDICATIONS: None ANESTHESIA/SEDATION: None FLUOROSCOPY: Radiation Exposure Index (as provided by the fluoroscopic device): 11 mGy Kerma COMPLICATIONS: None PROCEDURE: Informed written consent was obtained from the patient's family after a discussion of the risks, benefits, and alternatives to treatment. Questions regarding the procedure were encouraged and answered. The right neck was prepped with chlorhexidine in a sterile fashion, and a sterile drape was applied covering the operative field. Maximum barrier sterile technique with sterile gowns and gloves were used for the procedure. A timeout was performed prior to the initiation of the procedure. A micropuncture kit was utilized to access the left external jugular vein under direct, real-time ultrasound guidance after the overlying soft tissues were anesthetized with 1% lidocaine with epinephrine. Ultrasound image documentation was performed. Guidewire was advanced to the level of the IVC. A non tunneled standard triple-lumen catheter was placed. Final catheter positioning was confirmed and documented with a spot radiographic image. The catheter aspirates and flushes normally. Dressings were applied. The patient tolerated the procedure well without immediate post procedural complication. IMPRESSION: Status post image guided placement left external jugular vein central venous catheter. Signed, Dulcy Fanny. Dellia Nims, RPVI [facility] Radiology Electronically Signed   By: Corrie Mckusick D.O.   On: 09/12/2021 08:29     Family Communication: Daughter by phone today  Disposition: Status is: Inpatient Remains inpatient appropriate because: Remains critically ill Planned Discharge Destination:  TBD  Patrecia Pour, MD 09/13/2021 3:41 PM Page by [email or web address]

## 2021-09-13 NOTE — Progress Notes (Signed)
Received patient in bed, alert and oriented. Informed consent signed and in chart.  Time tx initiated:  Pre HD weight:  Pre HD VS:  Time tx completed:  HD treatment completed. Patient t not tolerated well, c/o feeling io her stomach' Fistula/Graft/HD catheter without signs and symptoms of complications. Patient transported back to the room, alerted and oriented and in no acute distress. Report given to bedside        Murray Hodgkins, Therapist, sports.  Total UF removed: 1.8L  Medication given:  Post HD VS:125/44,  Post HD weight: 145.6kg

## 2021-09-13 NOTE — Progress Notes (Signed)
Reviewed CTA chest and no evidence of proximal embolic source for splenic infarcts.  Right AKA is healing and looks good.  We will remove staples from right AKA next week if still here.  Otherwise has followup on 6/13 for post-op check in our office if discharged over weekend.   Marty Heck, MD [facility] Office: [place]

## 2021-09-13 NOTE — Progress Notes (Signed)
Nutrition Follow-up  DOCUMENTATION CODES:   Not applicable  INTERVENTION:   - Boost Breeze po TID, each supplement provides 250 kcal and 9 grams of protein  - Liberalize diet to Regular with 1200 ml fluid restriction to promote PO intake  - Renal MVI daily  - d/c tube feeding orders  NUTRITION DIAGNOSIS:   Inadequate oral intake related to nausea as evidenced by per patient/family report.  Ongoing  GOAL:   Patient will meet greater than or equal to 90% of their needs  Unmet  MONITOR:   PO intake, Supplement acceptance, Labs, Weight trends, Skin, I & O's  REASON FOR ASSESSMENT:   Consult Enteral/tube feeding initiation and management (advance tube feeds to goal)  ASSESSMENT:   51 year old female who presented to the ED on 6/06 with abdominal pain, N/V, constipation. PMH of ESRD on HD, gangrene of RLE s/p recent R AKA, CHF, CAD, T2DM, chronic hypoxic respiratory failure on home oxygen, HLD, HTN, gout, severe PAD. Pt admitted with splenic infarction, PNA of LLL, shock.  06/06 - NG tube placed 06/08 - NG tube removed during CT scan, replaced by nursing 06/09 - NG tube removed, Cortrak placement attempted but unsuccessful due to pt refusal  Noted pt vomited during HD overnight. Pt did not tolerate HD well due to low blood pressures. Net UF was 1800 ml. Next HD scheduled for 6/10.  Consult received to advance tube feeds to goal; however, pt refused Cortrak placement this morning. Spoke with pt and daughter at bedside. Pt confirms that she does not want Cortrak team to re-attempt placement today. She states that the Cortrak placement was making her "gag" and that she is tired of feeing that way. Discussed with Care Team. Will d/c Cortrak order and tube feeding order at this time.  Spoke with pt in-depth about the importance of adequate PO intake to promote healing, maintain lean muscle mass, and prevent dry weight loss. Pt expresses understanding. She reports that one of he  issues is "spitting up" constantly. Noted pt with emesis bag in hand. Pt reports that she is not vomiting but is spitting up saliva. Encouraged pt to request PRN anti-emetics to help with nausea. Reglan is currently ordered.  Pt reports that Nepro shakes cause her to have diarrhea (which she has been having a lot of) and that she does not want to drink them. She is willing to try Boost Breeze. RD provided a Boost Breeze supplement at time of visit and pt stated that she liked it.  Noted untouched lunch meal tray at bedside (broth, cookie, applesauce, juice). Pt's daughter states that she ordered liquids in hopes pt would tolerate these better than solid foods. RD to liberalize diet to Regular so that pt can have widest variety of food options to choose from to promote PO intake.  Pt reports feeling very weak and tired and wonders why this is. Explained to pt that lack of kcal and protein intake is likely contributing. Pt seemed more motivated to eat and drink at end of RD visit.  EDW: 151 kg Admit weight: 150.6 kg Current weight: 150.3 kg  Current TF: Vital 1.5 @ 20 ml/hr  Medications reviewed and include: calcitriol, tums 1500 mg TID, sensipar, aranesp, Nepro Shake BID, lactulose 30 grams TID, IV reglan 10 mg q 6 hours, rena-vit, protonix, miralax, senna, IV abx, heparin drip IVF: D10 @ 75 ml/hr  Labs reviewed: sodium 130, magnesium 1.6, WBC 26.0, hemoglobin 8.3 CBG's: 35-125 x 24 hours  I/O's: +5.6 L  since admit  NUTRITION - FOCUSED PHYSICAL EXAM:  Flowsheet Row Most Recent Value  Orbital Region No depletion  Upper Arm Region No depletion  Thoracic and Lumbar Region No depletion  Buccal Region No depletion  Temple Region No depletion  Clavicle Bone Region No depletion  Clavicle and Acromion Bone Region No depletion  Scapular Bone Region No depletion  Dorsal Hand No depletion  Patellar Region No depletion  Anterior Thigh Region No depletion  Posterior Calf Region No depletion   Edema (RD Assessment) Moderate  [BUE, BLE]  Hair Reviewed  Eyes Reviewed  Mouth Reviewed  Skin Reviewed  Nails Reviewed       Diet Order:   Diet Order             Diet regular Room service appropriate? Yes; Fluid consistency: Thin; Fluid restriction: 1200 mL Fluid  Diet effective now                   EDUCATION NEEDS:   Education needs have been addressed  Skin:  Skin Assessment: Skin Integrity Issues: Stage II: bilateral buttocks Incisions: RLE s/p AKA Other: wound to L hand finger  Last BM:  09/12/21  Height:   Ht Readings from Last 1 Encounters:  09/10/21 5\' 5"  (1.651 m)    Weight:   Wt Readings from Last 1 Encounters:  09/13/21 (!) 150.3 kg    Ideal Body Weight:  52.3 kg (adjusted for R AKA)  BMI:  Body mass index is 55.14 kg/m.  Estimated Nutritional Needs:   Kcal:  1800-2000  Protein:  90-110 grams  Fluid:  1000 ml + UOP    Gustavus Bryant, MS, RD, LDN Inpatient Clinical Dietitian Please see AMiON for contact information.

## 2021-09-13 NOTE — Progress Notes (Signed)
Pharmacy Antibiotic Note  Courtney Gray is a 51 y.o. female admitted on 09/09/2021 with sepsis. Source unclear - pt has splenic infarct, recent AKA, and recent L hand infection (on doxycycline PTA). Pharmacy has been consulted for Vancomycin and Cefepime dosing. Pt is ESRD on HD TTS, last HD last last night/early this morning. Cefepime given, no vancomycin given overnight.  Plan: Continue cefepime 2g IV qHD TTS Vancomycin 1000mg  IV x1 now then continue 1000mg  qHD TTS   Temp (24hrs), Avg:98.2 F (36.8 C), Min:97.8 F (36.6 C), Max:98.5 F (36.9 C)  Recent Labs  Lab 09/10/21 0031 09/10/21 0040 09/10/21 0530 09/10/21 0920 09/10/21 1610 09/11/21 0324 09/11/21 1809 09/12/21 0225  WBC 14.2*  --   --  36.0*  --  44.3*  --  34.3*  CREATININE 10.19* 11.20*  --  10.70* 10.95* 11.23*  --  11.84*  LATICACIDVEN  --   --  3.7* 2.2*  --   --  0.9  --      Estimated Creatinine Clearance: 8.4 mL/min (A) (by C-G formula based on SCr of 11.84 mg/dL (H)).    Allergies  Allergen Reactions   Benzonatate Other (See Comments)    Hallucinations   Hydralazine Other (See Comments)    Hallucinations   Amoxicillin Itching and Nausea And Vomiting   Clonidine Other (See Comments)    Altered mental state, insomnia    Chlorhexidine Itching   Morphine Itching   Oxycodone Itching    Pt tolerated hospital admission 07/2021    Antimicrobials this admission: Cefepime 6/6 >>  Vancomycin 6/6 >>    Microbiology results: 6/6 BCx: NGTD  Thank you for allowing pharmacy to be a part of this patient's care.   [name], PharmD, BCPS, [facility] Clinical Pharmacist [phone] Please check AMION for all [place] numbers 09/13/2021

## 2021-09-13 NOTE — Progress Notes (Signed)
ANTICOAGULATION CONSULT NOTE   Pharmacy Consult for Heparin infusion Indication:  splenic infarction  Allergies  Allergen Reactions   Benzonatate Other (See Comments)    Hallucinations   Hydralazine Other (See Comments)    Hallucinations   Amoxicillin Itching and Nausea And Vomiting   Clonidine Other (See Comments)    Altered mental state, insomnia    Chlorhexidine Itching    Patient reports it is the CHG baths that cause her itching not the CHG used for caring for her IV/HD access.     Morphine Itching   Oxycodone Itching    Pt tolerated hospital admission 07/2021    Patient Measurements: Height: 5\' 5"  (165.1 cm) Weight: (!) 150.3 kg (331 lb 5.6 oz) IBW/kg (Calculated) : 57 Heparin Dosing Weight: 95.1 kg  Vital Signs: Temp: 98.5 F (36.9 C) (06/09 0332) Temp Source: Oral (06/09 0332) BP: 140/46 (06/09 0332) Pulse Rate: 103 (06/09 0300)  Labs: Recent Labs    09/11/21 0324 09/11/21 1314 09/12/21 0225 09/12/21 0846 09/12/21 2050 09/13/21 0751 09/13/21 1345  HGB 9.0*  --  8.2*  --   --  8.3*  --   HCT 30.4*  --  27.5*  --   --  27.3*  --   PLT 328  --  274  --   --  203  --   HEPARINUNFRC 0.86*   < >  --  0.12* 0.17*  --  0.21*  CREATININE 11.23*  --  11.84*  --   --  7.85*  --    < > = values in this interval not displayed.     Estimated Creatinine Clearance: 12.6 mL/min (A) (by C-G formula based on SCr of 7.85 mg/dL (H)).   Medical History: Past Medical History:  Diagnosis Date   Arthritis    Diabetes mellitus without complication ([place])    Dialysis complication, subsequent encounter    Gout    High cholesterol    Hypertension    Renal disorder     Medications:  Medications Prior to Admission  Medication Sig Dispense Refill Last Dose   acetaminophen (TYLENOL) 500 MG tablet Take 1,000 mg by mouth as needed for moderate pain.   09/09/2021   amLODipine (NORVASC) 10 MG tablet Take 1 tablet (10 mg total) by mouth daily. (Patient taking differently: Take  10 mg by mouth daily as needed (Blood pressusre).) 30 tablet 5 unk   aspirin EC 81 MG tablet Take 1 tablet (81 mg total) by mouth daily with breakfast. 30 tablet 11 09/09/2021   atorvastatin (LIPITOR) 10 MG tablet Take 1 tablet (10 mg total) by mouth daily. (Patient taking differently: Take 10 mg by mouth at bedtime.) 30 tablet 5 09/08/2021   calcium carbonate (TUMS SMOOTHIES) 750 MG chewable tablet Chew 1,500 mg by mouth See admin instructions. Take 1500 mg with each meal and 1500 mg with each snack   09/09/2021   carvedilol (COREG) 6.25 MG tablet Take 1 tablet (6.25 mg total) by mouth 2 (two) times daily with a meal. (Patient taking differently: Take 6.25 mg by mouth 2 (two) times daily as needed (if BP > 150).) 60 tablet 0 Past Week at unk   diphenhydrAMINE (BENADRYL) 25 MG tablet Take 25 mg by mouth 2 (two) times daily as needed for allergies.   unk   docusate sodium (COLACE) 100 MG capsule Take 100 mg by mouth 2 (two) times daily.   09/09/2021   gabapentin (NEURONTIN) 100 MG capsule Take 1 capsule (100 mg total) by  mouth 2 (two) times daily. (Patient taking differently: Take 100 mg by mouth See admin instructions. Take 100 mg by mouth twice a day on non dialysis days. On dialysis day take 100 mg in the morning, 200 mg after dialysis, and then 100 mg at bedtime.) 60 capsule 0 09/09/2021   glipiZIDE (GLUCOTROL XL) 2.5 MG 24 hr tablet Take 1 tablet (2.5 mg total) by mouth daily with breakfast. 30 tablet 0 09/09/2021   HYDROcodone-acetaminophen (NORCO/VICODIN) 5-325 MG tablet Take 1-2 tablets by mouth every 4 (four) hours as needed for moderate pain. (Patient taking differently: Take 1 tablet by mouth every 4 (four) hours as needed for moderate pain.) 20 tablet 0 09/08/2021   insulin glargine (LANTUS) 100 UNIT/ML Solostar Pen Inject 12 Units into the skin daily.   Past Week   loperamide (IMODIUM A-D) 2 MG tablet Take 4 mg by mouth as needed for diarrhea or loose stools.   unk   nitroGLYCERIN (NITROSTAT) 0.4 MG SL  tablet Place 1 tablet (0.4 mg total) under the tongue every 5 (five) minutes as needed for chest pain. 10 tablet 3 unk   pantoprazole (PROTONIX) 40 MG tablet Take 1 tablet (40 mg total) by mouth daily. 30 tablet 5 09/09/2021   simethicone (MYLICON) 794 MG chewable tablet Chew 125 mg by mouth in the morning and at bedtime.   09/09/2021   tiZANidine (ZANAFLEX) 4 MG capsule Take 4 mg by mouth in the morning and at bedtime.   09/09/2021   doxycycline (VIBRAMYCIN) 100 MG capsule Take 100 mg by mouth 2 (two) times daily. (Patient not taking: Reported on 09/10/2021)   Completed Course   lisinopril (ZESTRIL) 20 MG tablet Take 1 tablet (20 mg total) by mouth daily. (Patient not taking: Reported on 09/10/2021) 30 tablet 0 Not Taking   traMADol (ULTRAM) 50 MG tablet Take 1 tablet (50 mg total) by mouth every 6 (six) hours as needed for moderate pain. (Patient not taking: Reported on 09/10/2021) 20 tablet 0 Completed Course    Assessment: [name] presented to ED with c/o abdominal pain, constipation, nausea, and vomiting x 1 week. CT abdomen shows splenic infarction. Pharmacy consulted to dose and manage heparin infusion. Pt not on AC prior to admit.  Repeat heparin level this afternoon remains subtherapeutic at 0.21. Level drawn appropriately by RN, no S/Sx bleeding noted.  Goal of Therapy:  Heparin level 0.3-0.7 units/ml Monitor platelets by anticoagulation protocol: Yes   Plan:  Rebolus heparin 2500 units x1 Increase heparin infusion to 2800 units/h Recheck heparin level in 8h   [name], PharmD, [place], [facility] Clinical Pharmacist [phone] Please check AMION for all [facility] Pharmacy numbers 09/13/2021

## 2021-09-13 NOTE — Care Management Important Message (Signed)
Important Message  Patient Details  Name: Courtney Gray MRN: [id number] Date of Birth: 10-23-1970   Medicare Important Message Given:  Yes     Shelda Altes 09/13/2021, 9:30 AM

## 2021-09-14 DIAGNOSIS — N186 End stage renal disease: Secondary | ICD-10-CM | POA: Diagnosis not present

## 2021-09-14 DIAGNOSIS — R778 Other specified abnormalities of plasma proteins: Secondary | ICD-10-CM | POA: Diagnosis not present

## 2021-09-14 DIAGNOSIS — D735 Infarction of spleen: Secondary | ICD-10-CM | POA: Diagnosis not present

## 2021-09-14 DIAGNOSIS — J9611 Chronic respiratory failure with hypoxia: Secondary | ICD-10-CM | POA: Diagnosis not present

## 2021-09-14 LAB — HEPATIC FUNCTION PANEL
ALT: 10 U/L (ref 0–44)
AST: 16 U/L (ref 15–41)
Albumin: 1.9 g/dL — ABNORMAL LOW (ref 3.5–5.0)
Alkaline Phosphatase: 124 U/L (ref 38–126)
Bilirubin, Direct: 0.2 mg/dL (ref 0.0–0.2)
Indirect Bilirubin: 0.5 mg/dL (ref 0.3–0.9)
Total Bilirubin: 0.7 mg/dL (ref 0.3–1.2)
Total Protein: 6 g/dL — ABNORMAL LOW (ref 6.5–8.1)

## 2021-09-14 LAB — CBC WITH DIFFERENTIAL/PLATELET
Abs Immature Granulocytes: 0.23 10*3/uL — ABNORMAL HIGH (ref 0.00–0.07)
Basophils Absolute: 0.1 10*3/uL (ref 0.0–0.1)
Basophils Relative: 0 %
Eosinophils Absolute: 0.6 10*3/uL — ABNORMAL HIGH (ref 0.0–0.5)
Eosinophils Relative: 3 %
HCT: 26.2 % — ABNORMAL LOW (ref 36.0–46.0)
Hemoglobin: 8.1 g/dL — ABNORMAL LOW (ref 12.0–15.0)
Immature Granulocytes: 1 %
Lymphocytes Relative: 6 %
Lymphs Abs: 1.3 10*3/uL (ref 0.7–4.0)
MCH: 28.2 pg (ref 26.0–34.0)
MCHC: 30.9 g/dL (ref 30.0–36.0)
MCV: 91.3 fL (ref 80.0–100.0)
Monocytes Absolute: 1.9 10*3/uL — ABNORMAL HIGH (ref 0.1–1.0)
Monocytes Relative: 9 %
Neutro Abs: 17 10*3/uL — ABNORMAL HIGH (ref 1.7–7.7)
Neutrophils Relative %: 81 %
Platelets: 190 10*3/uL (ref 150–400)
RBC: 2.87 MIL/uL — ABNORMAL LOW (ref 3.87–5.11)
RDW: 19.5 % — ABNORMAL HIGH (ref 11.5–15.5)
WBC: 21.2 10*3/uL — ABNORMAL HIGH (ref 4.0–10.5)
nRBC: 0 % (ref 0.0–0.2)

## 2021-09-14 LAB — RENAL FUNCTION PANEL
Albumin: 1.9 g/dL — ABNORMAL LOW (ref 3.5–5.0)
Anion gap: 16 — ABNORMAL HIGH (ref 5–15)
BUN: 43 mg/dL — ABNORMAL HIGH (ref 6–20)
CO2: 21 mmol/L — ABNORMAL LOW (ref 22–32)
Calcium: 5.9 mg/dL — CL (ref 8.9–10.3)
Chloride: 93 mmol/L — ABNORMAL LOW (ref 98–111)
Creatinine, Ser: 8.16 mg/dL — ABNORMAL HIGH (ref 0.44–1.00)
GFR, Estimated: 5 mL/min — ABNORMAL LOW (ref >60)
Glucose, Bld: 92 mg/dL (ref 70–99)
Phosphorus: 4.1 mg/dL (ref 2.5–4.6)
Potassium: 4.6 mmol/L (ref 3.5–5.1)
Sodium: 130 mmol/L — ABNORMAL LOW (ref 135–145)

## 2021-09-14 LAB — HEPARIN LEVEL (UNFRACTIONATED): Heparin Unfractionated: 0.36 IU/mL (ref 0.30–0.70)

## 2021-09-14 LAB — GLUCOSE, CAPILLARY
Glucose-Capillary: 123 mg/dL — ABNORMAL HIGH (ref 70–99)
Glucose-Capillary: 112 mg/dL — ABNORMAL HIGH (ref 70–99)

## 2021-09-14 MED ORDER — LIDOCAINE-PRILOCAINE 2.5-2.5 % EX CREA: 1.0000 "application " | TOPICAL_CREAM | CUTANEOUS | Status: DC | PRN

## 2021-09-14 MED ORDER — PROCHLORPERAZINE EDISYLATE 10 MG/2ML IJ SOLN
5.0000 mg | Freq: Four times a day (QID) | INTRAMUSCULAR | Status: DC | PRN
  Administered 2021-09-25 – 2021-10-09 (×3): 5 mg via INTRAVENOUS
  Filled 2021-09-14 (×3): qty 2

## 2021-09-14 MED ORDER — PANTOPRAZOLE SODIUM 40 MG PO TBEC
40.0000 mg | DELAYED_RELEASE_TABLET | Freq: Two times a day (BID) | ORAL | Status: DC
  Administered 2021-09-14 – 2021-10-21 (×69): 40 mg via ORAL
  Filled 2021-09-14 (×68): qty 1

## 2021-09-14 MED ORDER — ALBUTEROL SULFATE (2.5 MG/3ML) 0.083% IN NEBU
2.5000 mg | INHALATION_SOLUTION | RESPIRATORY_TRACT | Status: DC | PRN
  Administered 2021-09-15: 2.5 mg via RESPIRATORY_TRACT
  Filled 2021-09-14 (×2): qty 3

## 2021-09-14 MED ORDER — ALTEPLASE 2 MG IJ SOLR: 2.0000 mg | Freq: Once | INTRAMUSCULAR | Status: DC | PRN

## 2021-09-14 MED ORDER — SUCRALFATE 1 GM/10ML PO SUSP
1.0000 g | Freq: Three times a day (TID) | ORAL | Status: AC
  Administered 2021-09-16: 1 g via ORAL
  Filled 2021-09-14 (×3): qty 10

## 2021-09-14 MED ORDER — PENTAFLUOROPROP-TETRAFLUOROETH EX AERO: 1.0000 "application " | INHALATION_SPRAY | CUTANEOUS | Status: DC | PRN

## 2021-09-14 MED ORDER — ANTICOAGULANT SODIUM CITRATE 4% (200MG/5ML) IV SOLN: 5.0000 mL | Status: DC | PRN

## 2021-09-14 MED ORDER — HEPARIN SODIUM (PORCINE) 1000 UNIT/ML DIALYSIS: 1000.0000 [IU] | INTRAMUSCULAR | Status: DC | PRN

## 2021-09-14 MED ORDER — MIDODRINE HCL 5 MG PO TABS
ORAL_TABLET | ORAL | Status: AC
  Filled 2021-09-14: qty 2

## 2021-09-14 NOTE — Progress Notes (Signed)
[facility] Progress Note   Subjective:    Seen and examined patient at bedside. Still c/o nausea and vomiting clear sputum after eating-concerned about this. Plan for HD today.  Objective Vitals:   09/13/21 1935 09/13/21 2116 09/14/21 0025 09/14/21 0413  BP: (!) 161/75 (!) 157/68 (!) 158/75 (!) 147/59  Pulse: 85  86 83  Resp: 18  17 17   Temp: 97.8 F (36.6 C)  99.6 F (37.6 C) 98.5 F (36.9 C)  TempSrc: Axillary  Oral Oral  SpO2: 100%  100% 95%  Weight:      Height:       Physical Exam General: Ill-appearing; on 4L via [place]; NAD Heart:S1 and S2; No murmurs, gallops, or rubs Lungs: Clear anteriorly and posteriorly-uppers; diminished laterally; distant air movement Abdomen: Large, soft, and non-tender Extremities: No edema LLE; R AKA-no swelling noted at stump Dialysis Access: R [facility]   Filed Weights   09/12/21 0615 09/12/21 2159 09/13/21 0332  Weight: (!) 150.8 kg (!) 146.7 kg (!) 150.3 kg    Intake/Output Summary (Last 24 hours) at 09/14/2021 0858 Last data filed at 09/14/2021 0303 Gross per 24 hour  Intake 2740.54 ml  Output --  Net 2740.54 ml    Additional Objective Labs: Basic Metabolic Panel: Recent Labs  Lab 09/12/21 0225 09/12/21 2050 09/13/21 0751 09/14/21 0636  NA 130*  --  130* 130*  K 3.9  --  3.5 4.6  CL 92*  --  94* 93*  CO2 22  --  26 21*  GLUCOSE 74  --  104* 92  BUN 69*  --  40* 43*  CREATININE 11.84*  --  7.85* 8.16*  CALCIUM 7.7*  --  7.5* 5.9*  PHOS  --  5.9* 3.6 4.1   Liver Function Tests: Recent Labs  Lab 09/10/21 0031 09/10/21 0920 09/11/21 0324 09/14/21 0636  AST 18  --  30 16  ALT 11  --  11 10  ALKPHOS 79  --  76 124  BILITOT 0.7  --  0.8 0.7  PROT 7.8  --  7.1 6.0*  ALBUMIN 2.8* 2.5* 2.4* 1.9*  1.9*   Recent Labs  Lab 09/10/21 0031  LIPASE 37   CBC: Recent Labs  Lab 09/10/21 0031 09/10/21 0040 09/10/21 0920 09/11/21 0324 09/12/21 0225 09/13/21 0751 09/14/21 0636  WBC 14.2*  --  36.0* 44.3*  34.3* 26.0* 21.2*  NEUTROABS 13.1*  --   --  39.5*  --   --  17.0*  HGB 9.5*   < > 8.8* 9.0* 8.2* 8.3* 8.1*  HCT 32.3*   < > 30.9* 30.4* 27.5* 27.3* 26.2*  MCV 95.0  --  97.2 94.1 92.6 91.9 91.3  PLT 384  --  325 328 274 203 190   < > = values in this interval not displayed.   Blood Culture    Component Value Date/Time   SDES BLOOD RIGHT FOREARM 09/10/2021 1600   SPECREQUEST  09/10/2021 1600    BOTTLES DRAWN AEROBIC ONLY Blood Culture results may not be optimal due to an inadequate volume of blood received in culture bottles   CULT  09/10/2021 1600    NO GROWTH 3 DAYS Performed at [facility], [place] [address] [place], [place] [address]    REPTSTATUS PENDING 09/10/2021 1600    Cardiac Enzymes: No results for input(s): "CKTOTAL", "CKMB", "CKMBINDEX", "TROPONINI" in the last 168 hours. CBG: Recent Labs  Lab 09/13/21 9371 09/13/21 1138 09/13/21 1639 09/13/21 2201 09/14/21 0827  GLUCAP 125* 172* 146* 100* 123*   Iron Studies: No results for input(s): "IRON", "TIBC", "TRANSFERRIN", "FERRITIN" in the last 72 hours. Lab Results  Component Value Date   INR 1.3 (H) 09/10/2021   INR 1.3 (H) 07/10/2021   Studies/Results: DG Abd 1 View  Result Date: 09/12/2021 CLINICAL DATA:  Nasogastric tube placement EXAM: ABDOMEN - 1 VIEW COMPARISON:  09/10/2021 FINDINGS: One view of the lower chest and upper abdomen. One view of the upper abdomen. Nasogastric tube is followed to the level of the gastric body, not visualized more distally. Expected limitations secondary to patient morbid obesity. No gross free intraperitoneal air. Cardiomegaly with probable layering bilateral pleural effusions. Dialysis catheter tip at low SVC. Left subclavian line tip at high SVC. IMPRESSION: Although the nasogastric tube is difficult to follow distally, likely terminates at the gastric body. Electronically Signed   By: Abigail Miyamoto M.D.   On: 09/12/2021 19:48   CT ANGIO CHEST AORTA W/CM & OR  WO/CM  Addendum Date: 09/12/2021   ADDENDUM REPORT: 09/12/2021 16:34 ADDENDUM: These results were called by telephone at the time of interpretation on 09/12/2021 at 4:33 pm to provider Dr. Loanne Drilling, who verbally acknowledged these results. Electronically Signed   By: Ronney Asters M.D.   On: 09/12/2021 16:34   Result Date: 09/12/2021 CLINICAL DATA:  Evaluate for embolism.  Chest pain. EXAM: CT ANGIOGRAPHY CHEST WITH CONTRAST TECHNIQUE: Multidetector CT imaging of the chest was performed using the standard protocol during bolus administration of intravenous contrast. Multiplanar CT image reconstructions and MIPs were obtained to evaluate the vascular anatomy. RADIATION DOSE REDUCTION: This exam was performed according to the departmental dose-optimization program which includes automated exposure control, adjustment of the mA and/or kV according to patient size and/or use of iterative reconstruction technique. CONTRAST:  158mL OMNIPAQUE IOHEXOL 350 MG/ML SOLN COMPARISON:  None Available. FINDINGS: Cardiovascular: Satisfactory opacification of the pulmonary arteries to the segmental level. No evidence of pulmonary embolism. Heart is enlarged. No pericardial effusion. There are atherosclerotic calcifications of the aorta and coronary arteries. Right-sided central venous catheter tip is in the right atrium. Left-sided central venous catheter tip is at the brachiocephalic SVC junction. Mediastinum/Nodes: No enlarged mediastinal, hilar, or axillary lymph nodes. Esophagus is nondilated. Enteric tube tip is in the upper thoracic esophagus near the level of the thoracic inlet. Visualized thyroid gland is within normal limits. Lungs/Pleura: There is a small amount of atelectasis and airspace disease in the inferior left lower lobe. There are trace bilateral pleural effusions. There are atelectatic changes in the right middle lobe and right lower lobe. No evidence for pneumothorax. Trachea and central airways are patent. Upper  Abdomen: No acute abnormality. Musculoskeletal: No chest wall abnormality. No acute or significant osseous findings. Review of the MIP images confirms the above findings. IMPRESSION: 1. No evidence for pulmonary embolism. 2. Small amount of atelectasis and airspace disease in the left lower lobe worrisome for infection. 3. Trace bilateral pleural effusions. 4. Enteric tube tip is in the proximal thoracic esophagus. Recommend repositioning. Aortic Atherosclerosis (ICD10-I70.0). Electronically Signed: By: Ronney Asters M.D. On: 09/12/2021 16:20    Medications:  sodium chloride Stopped (09/13/21 0341)   ceFEPime (MAXIPIME) IV Stopped (09/13/21 0424)   dextrose 75 mL/hr at 09/14/21 0303   heparin 2,800 Units/hr (09/14/21 0303)   vancomycin Stopped (09/10/21 1706)    atorvastatin  10 mg Oral QHS   calcitRIOL  1 mcg Oral Q T,Th,Sa-HD   calcium carbonate  1,500 mg Oral TID with meals  cinacalcet  30 mg Oral Q T,Th,Sa-HD   darbepoetin (ARANESP) injection - DIALYSIS  200 mcg Intravenous Q Thu-HD   diclofenac Sodium  2 g Topical QID   feeding supplement  1 Container Oral TID BM   gabapentin  100 mg Oral Q12H   gabapentin  200 mg Oral Q T,Th,Sa-HD   Gerhardt's butt cream   Topical BID   lactulose  30 g Oral TID   metoCLOPramide (REGLAN) injection  10 mg Intravenous Q6H   midodrine  10 mg Oral Q8H   multivitamin  1 tablet Oral QHS   pantoprazole  40 mg Oral Daily   polyethylene glycol  17 g Oral Daily   senna-docusate  1 tablet Oral BID   sodium chloride flush  10-40 mL Intracatheter Q12H   sodium chloride flush  3 mL Intravenous Q12H    Dialysis Orders: [name] TTS 4h 104min  151 kg  1K/2.5Ca bath  TDC  400 BFR - 4/29 > HBsAg neg w/ Abs < 10 Hep 2,600 load and 1,500 hourly  - mircera 200ug q2 due 09/10/21 - calcitriol 1 ug tiw - sensipar 30 mg tiw  Assessment/Plan:  ESRD HD TTS: now back on routine schedule. Noted pt didn't tolerated HD overnight d/t vomiting and low Bps. Monitor  trends closely. Continue Midodrine with HD. Next HD 6/10. Abdominal pain splenic infarct on CT: continue current pain management and heparin drip. Anemia of ESRD /acute illness-Hgb 8.1 Continue Aranesp 791 mcg Metabolic bone disease -PO4 now at goal. Plan to start binders once patient can tolerate solids. PAD status post right AKA/ LLE PAD= VVS following. Plan for staple removal in outpatient. Diabetes mellitus type 2= per primary  Tobie Poet, NP [facility] 09/14/2021,8:58 AM  LOS: 4 days

## 2021-09-14 NOTE — Progress Notes (Signed)
ANTICOAGULATION CONSULT NOTE - Follow Up Consult  Pharmacy Consult for Heparin Indication:  Splenic infarction  Allergies  Allergen Reactions   Benzonatate Other (See Comments)    Hallucinations   Hydralazine Other (See Comments)    Hallucinations   Amoxicillin Itching and Nausea And Vomiting   Clonidine Other (See Comments)    Altered mental state, insomnia    Chlorhexidine Itching    Patient reports it is the CHG baths that cause her itching not the CHG used for caring for her IV/HD access.     Morphine Itching   Oxycodone Itching    Pt tolerated hospital admission 07/2021    Patient Measurements: Height: 5\' 5"  (165.1 cm) Weight: (!) 150.3 kg (331 lb 5.6 oz) IBW/kg (Calculated) : 57 Heparin Dosing Weight: 95.1 kg  Vital Signs: Temp: 98.5 Gray (36.9 C) (06/10 0413) Temp Source: Oral (06/10 0413) BP: 147/59 (06/10 0413) Pulse Rate: 83 (06/10 0413)  Labs: Recent Labs    09/12/21 0225 09/12/21 0846 09/13/21 0751 09/13/21 1345 09/13/21 2339 09/14/21 0636 09/14/21 0800  HGB 8.2*  --  8.3*  --   --  8.1*  --   HCT Courtney.5*  --  Courtney.3*  --   --  26.2*  --   PLT 274  --  203  --   --  190  --   HEPARINUNFRC  --    < >  --  0.21* 0.36  --  0.36  CREATININE 11.84*  --  7.85*  --   --  8.16*  --    < > = values in this interval not displayed.    Estimated Creatinine Clearance: 12.1 mL/min (A) (by C-G formula based on SCr of 8.16 mg/dL (H)).   Medications:  Scheduled:   atorvastatin  10 mg Oral QHS   calcitRIOL  1 mcg Oral Q T,Th,Sa-HD   calcium carbonate  1,500 mg Oral TID with meals   cinacalcet  30 mg Oral Q T,Th,Sa-HD   darbepoetin (ARANESP) injection - DIALYSIS  200 mcg Intravenous Q Thu-HD   diclofenac Sodium  2 g Topical QID   feeding supplement  1 Container Oral TID BM   gabapentin  100 mg Oral Q12H   gabapentin  200 mg Oral Q T,Th,Sa-HD   Gerhardt's butt cream   Topical BID   lactulose  30 g Oral TID   metoCLOPramide (REGLAN) injection  10 mg Intravenous Q6H    midodrine  10 mg Oral Q8H   multivitamin  1 tablet Oral QHS   pantoprazole  40 mg Oral BID   polyethylene glycol  17 g Oral Daily   senna-docusate  1 tablet Oral BID   sodium chloride flush  10-40 mL Intracatheter Q12H   sodium chloride flush  3 mL Intravenous Q12H   sucralfate  1 g Oral TID WC & HS   Infusions:   sodium chloride Stopped (09/13/21 0341)   ceFEPime (MAXIPIME) IV Stopped (09/13/21 0424)   dextrose 75 mL/hr at 09/14/21 0303   heparin 2,800 Units/hr (09/14/21 0915)   vancomycin Stopped (09/10/21 1706)    Assessment: 51 yo Gray presented to ED with c/o abdominal pain, constipation, nausea, and vomiting x 1 week. CT abdomen shows splenic infarction. Pt not on anticoagulation PTA. Pharmacy consulted for heparin dosing.   Heparin level today is therapeutic at 0.36, on 2800 units/hr. Hgb 8.1, plt 190. No line issues or signs/symptoms of bleeding noted.  Goal of Therapy:  Heparin level 0.3-0.7 units/ml Monitor platelets by anticoagulation  protocol: Yes   Plan:  Continue IV heparin at 2800 units/hr. Daily CBC, heparin level. Monitor for signs/symptoms of bleeding.   Vance Peper, PharmD PGY1 Pharmacy Resident Phone [phone] 09/14/2021 10:13 AM   Please check AMION for all [place] phone numbers After 10:00 PM, call [place] [phone]

## 2021-09-14 NOTE — Plan of Care (Signed)

## 2021-09-14 NOTE — Progress Notes (Signed)
Pt reported feeling SOB. Patients vitals were checked and were WDL. Patient requested Non-rebreathe for comfort. MD paged about changes. Will continue to monitor.

## 2021-09-14 NOTE — Evaluation (Signed)
Physical Therapy Evaluation Patient Details Name: Courtney Gray MRN: [id number] DOB: Feb 08, 1971 Today's Date: 09/14/2021  History of Present Illness  Pt is a 51 y.o. female admitted 09/09/21 with severe LUW abdominal pain, hypoglycemia. CT revealing hypoenhancement in the spleen consistent with infarct. 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 PMH includes PAD s/p recent R AKA (08/14/21), ESRD (HD TTS), CHF (chronic 2L O2), DM2, morbid obesity, gout, HTN, HLD.   Clinical Impression  Pt presents with an overall decrease in functional mobility secondary to above. PTA, pt has been having increased difficulty managing at home since recent R AKA (08/14/21); pt limited to bed-level mobility since daughter unable to work [employer], pt relies on emergency services anytime she needs to get out of bed or out of the house. Today, pt requiring maxA for bed-level repositioning; will require maximove lift for OOB transfers with nursing. Pt hopeful for d/c to ALF/LTC due to inability to manage at home; will trial acute PT services with hopes to progress mobility while admitted.   Recommendations for follow up therapy are one component of a multi-disciplinary discharge planning process, led by the attending physician.  Recommendations may be updated based on patient status, additional functional criteria and insurance authorization.  Follow Up Recommendations Long-term institutional care without follow-up therapy - pt requesting ALF/LTC    Assistance Recommended at Discharge Intermittent Supervision/Assistance  Patient can return home with the following  Two people to help with walking and/or transfers;A lot of help with bathing/dressing/bathroom;Assistance with cooking/housework;Assist for transportation;Help with stairs or ramp for entrance    Equipment Recommendations Other (comment) (electric hoyer lift)  Recommendations for Other Services       Functional Status Assessment Patient has had  a recent decline in their functional status and/or demonstrates limited ability to make significant improvements in function in a reasonable and predictable amount of time     Precautions / Restrictions Precautions Precautions: Fall;Other (comment) Precaution Comments: H/o R AKA (08/2021), chronic 2L O2      Mobility  Bed Mobility Overal bed mobility: Needs Assistance Bed Mobility: Supine to Sit           General bed mobility comments: pt able to go from [facility] highly elevated to long sitting, mod indep for LLE on/off bed with preference to let it dangle; pt requiring maxA to scoot R hips with bed pad towards R-side of bed for balance, pt assisting by pulling on bed rail    Transfers                   General transfer comment:  (will require maximove lift)    Ambulation/Gait                  Stairs            Wheelchair Mobility    Modified Rankin (Stroke Patients Only)       Balance Overall balance assessment: Needs assistance Sitting-balance support: No upper extremity supported, Feet unsupported Sitting balance-[name] Scale: Fair Sitting balance - Comments: can maintain unsupported long sitting to perform ADL tasks (including L foot lotion application)                                     Pertinent Vitals/Pain Pain Assessment Pain Assessment: Faces Faces Pain Scale: Hurts a little bit Pain Location: abdomen, R residual limb Pain Descriptors / Indicators: Discomfort Pain Intervention(s): Monitored during  session    Home Living Family/patient expects to be discharged to:: Private residence Living Arrangements: Children Available Help at Discharge: Family;Available PRN/intermittently Type of Home: Apartment Home Access: Ramped entrance       Home Layout: One level Home Equipment: Hand held shower head;Shower seat;Cane - single point;Grab bars - tub/shower;Rollator (4 wheels);Hospital bed Additional Comments: lives with  daughter    Prior Function Prior Level of Function : Needs assist             Mobility Comments: reports daughter cannot pump manual pump on hoyer lift well, so they cannot use it - pt calls fire dept everytime she gets into/out of home. able to push manual w/c herself, but it does not fit in home well. ramp recently got installed. pt hopeful for ALF/LTC ADLs Comments: Daughter assists with bed-level ADL tasks     Hand Dominance        Extremity/Trunk Assessment   Upper Extremity Assessment Upper Extremity Assessment: Generalized weakness (L 3rd finger wound)    Lower Extremity Assessment Lower Extremity Assessment: RLE deficits/detail;LLE deficits/detail RLE Deficits / Details: s/p R AKA (08/14/21) LLE Deficits / Details: functionally at least 3/5 strength observed supine/seated       Communication      Cognition Arousal/Alertness: Awake/alert Behavior During Therapy: WFL for tasks assessed/performed, Flat affect Overall Cognitive Status: Within Functional Limits for tasks assessed                                 General Comments: decreased insight into deficits        General Comments General comments (skin integrity, edema, etc.): increased time discussing d/c plan - pt preference for LTC/ALF since she is having significant difficulty managing at home. VSS on 4L O2 Courtney Gray    Exercises     Assessment/Plan    PT Assessment Patient needs continued PT services  PT Problem List Decreased strength;Decreased range of motion;Decreased activity tolerance;Decreased balance;Decreased mobility;Decreased coordination;Obesity;Pain;Cardiopulmonary status limiting activity       PT Treatment Interventions DME instruction;Functional mobility training;Therapeutic activities;Therapeutic exercise;Balance training;Patient/family education;Wheelchair mobility training    PT Goals (Current goals can be found in the Care Plan section)  Acute Rehab PT Goals Patient Stated  Goal: d/c to LTC/ALF PT Goal Formulation: With patient Time For Goal Achievement: 09/28/21 Potential to Achieve Goals: Fair    Frequency Min 2X/week     Co-evaluation               AM-PAC PT "6 Clicks" Mobility  Outcome Measure Help needed turning from your back to your side while in a flat bed without using bedrails?: A Lot Help needed moving from lying on your back to sitting on the side of a flat bed without using bedrails?: A Lot Help needed moving to and from a bed to a chair (including a wheelchair)?: Total Help needed standing up from a chair using your arms (e.g., wheelchair or bedside chair)?: Total Help needed to walk in hospital room?: Total Help needed climbing 3-5 steps with a railing? : Total 6 Click Score: 8    End of Session Equipment Utilized During Treatment: Oxygen Activity Tolerance: Patient tolerated treatment well Patient left: in bed;with call bell/phone within reach Nurse Communication: Mobility status;Need for lift equipment PT Visit Diagnosis: Other abnormalities of gait and mobility (R26.89);Muscle weakness (generalized) (M62.81)    Time: 5400-8676 PT Time Calculation (min) (ACUTE ONLY): 25 min   Charges:  PT Evaluation $PT Eval Moderate Complexity: 1 Mod      Mabeline Caras, PT, DPT Acute Rehabilitation Services  Pager [phone] Office [place] 09/14/2021, 1:43 PM

## 2021-09-14 NOTE — Progress Notes (Signed)
Progress Note  Patient: Courtney Gray NWG:[id number] DOB: June 07, 1970  DOA: 09/09/2021  DOS: 09/14/2021    Brief hospital course: Courtney Gray is a 51 y.o. 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 She was hypoglycemic, febrile with CT revealing hypoenhancement in the spleen consistent with infarct.   Assessment and Plan: Distributive shock with lactic acidosis: Pt has gangrene of left third digit and splenic infarcts which are the likely culprit for presentation. No definite nidus of infection currently noted.  - Continuing empiric abx for at least 5 days with severity of illness. WBC improving, but still 21k.  - Weaned from pressors 6/8. Continue midodrine. Will add hold parameters. - Monitor blood cultures.   IDT2DM uncontrolled with hypoglycemia: HbA1c 5.7% and recurrently hypoglycemic here.  - Continue D10 gtt and close monitoring.  - Encourage po intake. D/w pt and daughter. We removed NGT w/trickle feeds with plans for cor trak which the patient has declined.  - Hold insulins. Glucagon prn  Splenic infarct: No embolic source noted on echo or CT's. - Heparin gtt - Pain control. DC fentanyl. Change dilaudid to po.   Severe PAD s/p right AKA: Significant vascular compromise of LLE as well.  - VVS consulted, planning outpatient suture removal 6/13, will see here if still admitted. Recommended monitoring and protected LLE as this would be at high risk for amputation if wounded. - Continue statin, anticoagulation. Can restart ASA once more stable.  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  - Hand surgery consulted, recommending outpatient follow up to allow for evolutionary changes prior to amputation.  - CT chest showed no embolizing proximal aortic  lesions. - Vascular surgery stated she has multiphasic flow per recent duplex - Pain control: Gabapentin, tylenol, dilaudid  Chronic hypoxic respiratory failure, suspected OHS, OSA:  - Continue supplemental O2 to maintain SpO2 90-95% to avoid risk of hypercarbia. Continue BiPAP qHS. - Atelectasis noted at LLL, needs to be instructed and encouraged on use of IS.  - Bronchodilator prn, no wheezing currently  GERD:  - Augment PPI to BID, add carafate  Nausea:  - Add compazine prn refractory symptoms.  ESRD:  - Back on TTS scheduled for HD per nephrology.  - Continue midodrine as above to support BP during dialysis   Poor IV access: s/p left EJ triple lumen catheter placed by IR 6/7.   Morbid obesity: Body mass index is 55.4 kg/m.   HTN: Hold home BP meds, actually requiring midodrine after levophed wean.  - May need to reinitiate antihypertensive Tx once BPs rise.   Demand myocardial ischemia: Mild troponin elevation without angina.  - Defer further work up at this time.   Anemia of ESRD:  - Defer to nephrology whether ESA should be held. No active bleeding, will monitor hgb on anticoagulation.  HLD:  - Continue statin  Constipation: LBM 6/8 - Continue regular bowel regimen.   Stage 2 left and buttocks pressure injuries, POA:  - Offload as able.   Needs to start PT/OT, ordered  Subjective: Spitting up after any attempt at po, nauseated, also with heartburn type discomfort, hasn't gotten OOB and very eager to do so.   Objective: Vitals:   09/14/21 1630 09/14/21 1700 09/14/21 1730 09/14/21 1800  BP: (!) 153/91 (!) 180/96 Marland Kitchen)  162/80 (!) 163/79  Pulse: 83  88 90  Resp: 17 (!) 21 17 19   Temp:      TempSrc:      SpO2: 100% 100% 96%   Weight:      Height:       Gen: 51 y.o. female in no distress Pulm: Nonlabored on supplemental oxygen, no wheezing or crackles anterolaterally. CV: Regular rate and rhythm. Soft murmur, systolic at bas with no pitting dependent  edema. GI: Abdomen soft, large pannus, less tender than yesterday, non-distended, with normoactive bowel sounds.  Ext: Warm, no deformities Skin: No new rashes, lesions or ulcers on visualized skin. Stable changes in left hand. Neuro: Alert and oriented. No focal neurological deficits. Psych: Judgement and insight appear fair. Mood euthymic & affect congruent. Behavior is appropriate.    Data Personally reviewed: CBC: Recent Labs  Lab 09/10/21 0031 09/10/21 0040 09/10/21 0920 09/11/21 0324 09/12/21 0225 09/13/21 0751 09/14/21 0636  WBC 14.2*  --  36.0* 44.3* 34.3* 26.0* 21.2*  NEUTROABS 13.1*  --   --  39.5*  --   --  17.0*  HGB 9.5*   < > 8.8* 9.0* 8.2* 8.3* 8.1*  HCT 32.3*   < > 30.9* 30.4* 27.5* 27.3* 26.2*  MCV 95.0  --  97.2 94.1 92.6 91.9 91.3  PLT 384  --  325 328 274 203 190   < > = values in this interval not displayed.   Basic Metabolic Panel: Recent Labs  Lab 09/10/21 0920 09/10/21 1610 09/11/21 0324 09/12/21 0225 09/12/21 2050 09/13/21 0751 09/14/21 0636  NA 134* 135 132* 130*  --  130* 130*  K 3.3* 4.1 4.4 3.9  --  3.5 4.6  CL 97* 94* 94* 92*  --  94* 93*  CO2 19* 21* 22 22  --  26 21*  GLUCOSE 75 <20* 132* 74  --  104* 92  BUN 54* 57* 60* 69*  --  40* 43*  CREATININE 10.70* 10.95* 11.23* 11.84*  --  7.85* 8.16*  CALCIUM 8.0* 8.2* 7.9* 7.7*  --  7.5* 5.9*  MG  --   --  1.8  --  1.9 1.6*  --   PHOS 5.5*  --   --   --  5.9* 3.6 4.1   GFR: Estimated Creatinine Clearance: 12.2 mL/min (A) (by C-G formula based on SCr of 8.16 mg/dL (H)). Liver Function Tests: Recent Labs  Lab 09/10/21 0031 09/10/21 0920 09/11/21 0324 09/14/21 0636  AST 18  --  30 16  ALT 11  --  11 10  ALKPHOS 79  --  76 124  BILITOT 0.7  --  0.8 0.7  PROT 7.8  --  7.1 6.0*  ALBUMIN 2.8* 2.5* 2.4* 1.9*  1.9*   Recent Labs  Lab 09/10/21 0031  LIPASE 37   No results for input(s): "AMMONIA" in the last 168 hours. Coagulation Profile: Recent Labs  Lab 09/10/21 0403  INR  1.3*   Cardiac Enzymes: No results for input(s): "CKTOTAL", "CKMB", "CKMBINDEX", "TROPONINI" in the last 168 hours. BNP (last 3 results) No results for input(s): "PROBNP" in the last 8760 hours. HbA1C: No results for input(s): "HGBA1C" in the last 72 hours. CBG: Recent Labs  Lab 09/13/21 1138 09/13/21 1639 09/13/21 2201 09/14/21 0827 09/14/21 1219  GLUCAP 172* 146* 100* 123* 112*   Lipid Profile: No results for input(s): "CHOL", "HDL", "LDLCALC", "TRIG", "CHOLHDL", "LDLDIRECT" in the last 72 hours. Thyroid Function Tests: No results for input(s): "TSH", "T4TOTAL", "FREET4", "  T3FREE", "THYROIDAB" in the last 72 hours. Anemia Panel: No results for input(s): "VITAMINB12", "FOLATE", "FERRITIN", "TIBC", "IRON", "RETICCTPCT" in the last 72 hours. Urine analysis: No results found for: "COLORURINE", "APPEARANCEUR", "LABSPEC", "PHURINE", "GLUCOSEU", "HGBUR", "BILIRUBINUR", "KETONESUR", "PROTEINUR", "UROBILINOGEN", "NITRITE", "LEUKOCYTESUR" Recent Results (from the past 240 hour(s))  Culture, blood (routine x 2)     Status: None (Preliminary result)   Collection Time: 09/10/21  5:15 AM   Specimen: BLOOD LEFT HAND  Result Value Ref Range Status   Specimen Description BLOOD LEFT HAND  Final   Special Requests   Final    BOTTLES DRAWN AEROBIC ONLY Blood Culture results may not be optimal due to an inadequate volume of blood received in culture bottles   Culture   Final    NO GROWTH 4 DAYS Performed at [facility], [place] [address] [place], [place] [address]    Report Status PENDING  Incomplete  Culture, blood (routine x 2)     Status: None (Preliminary result)   Collection Time: 09/10/21  4:00 PM   Specimen: BLOOD RIGHT FOREARM  Result Value Ref Range Status   Specimen Description BLOOD RIGHT FOREARM  Final   Special Requests   Final    BOTTLES DRAWN AEROBIC ONLY Blood Culture results may not be optimal due to an inadequate volume of blood received in culture bottles   Culture    Final    NO GROWTH 4 DAYS Performed at [facility], [place] [address], [place], [place] [address]    Report Status PENDING  Incomplete  MRSA Next Gen by PCR, Nasal     Status: None   Collection Time: 09/10/21  5:36 PM   Specimen: Nasal Mucosa; Nasal Swab  Result Value Ref Range Status   MRSA by PCR Next Gen NOT DETECTED NOT DETECTED Final    Comment: (NOTE) The GeneXpert MRSA Assay (FDA approved for NASAL specimens only), is one component of a comprehensive MRSA colonization surveillance program. It is not intended to diagnose MRSA infection nor to guide or monitor treatment for MRSA infections. Test performance is not FDA approved in patients less than 7 years old. Performed at [facility], [place] [address], [place], [place] [address]      DG Abd 1 View  Result Date: 09/12/2021 CLINICAL DATA:  Nasogastric tube placement EXAM: ABDOMEN - 1 VIEW COMPARISON:  09/10/2021 FINDINGS: One view of the lower chest and upper abdomen. One view of the upper abdomen. Nasogastric tube is followed to the level of the gastric body, not visualized more distally. Expected limitations secondary to patient morbid obesity. No gross free intraperitoneal air. Cardiomegaly with probable layering bilateral pleural effusions. Dialysis catheter tip at low SVC. Left subclavian line tip at high SVC. IMPRESSION: Although the nasogastric tube is difficult to follow distally, likely terminates at the gastric body. Electronically Signed   By: Abigail Miyamoto M.D.   On: 09/12/2021 19:48     Family Communication: Daughter by phone 6/9  Disposition: Status is: Inpatient Remains inpatient appropriate because: Remains critically ill Planned Discharge Destination:  TBD  Patrecia Pour, MD 09/14/2021 7:04 PM Page by [email or web address]

## 2021-09-14 NOTE — Progress Notes (Signed)
ANTICOAGULATION CONSULT NOTE   Pharmacy Consult for Heparin infusion Indication:  splenic infarction  Allergies  Allergen Reactions   Benzonatate Other (See Comments)    Hallucinations   Hydralazine Other (See Comments)    Hallucinations   Amoxicillin Itching and Nausea And Vomiting   Clonidine Other (See Comments)    Altered mental state, insomnia    Chlorhexidine Itching    Patient reports it is the CHG baths that cause her itching not the CHG used for caring for her IV/HD access.     Morphine Itching   Oxycodone Itching    Pt tolerated hospital admission 07/2021    Patient Measurements: Height: 5\' 5"  (165.1 cm) Weight: (Abnormal) 150.3 kg (331 lb 5.6 oz) IBW/kg (Calculated) : 57 Heparin Dosing Weight: 95.1 kg  Vital Signs: Temp: 99.6 F (37.6 C) (06/10 0025) Temp Source: Oral (06/10 0025) BP: 158/75 (06/10 0025) Pulse Rate: 86 (06/10 0025)  Labs: Recent Labs    09/11/21 0324 09/11/21 1314 09/12/21 0225 09/12/21 0846 09/12/21 2050 09/13/21 0751 09/13/21 1345 09/13/21 2339  HGB 9.0*  --  8.2*  --   --  8.3*  --   --   HCT 30.4*  --  27.5*  --   --  27.3*  --   --   PLT 328  --  274  --   --  203  --   --   HEPARINUNFRC 0.86*   < >  --    < > 0.17*  --  0.21* 0.36  CREATININE 11.23*  --  11.84*  --   --  7.85*  --   --    < > = values in this interval not displayed.     Estimated Creatinine Clearance: 12.6 mL/min (A) (by C-G formula based on SCr of 7.85 mg/dL (H)).   Medical History: Past Medical History:  Diagnosis Date   Arthritis    Diabetes mellitus without complication ([place])    Dialysis complication, subsequent encounter    Gout    High cholesterol    Hypertension    Renal disorder     Medications:  Medications Prior to Admission  Medication Sig Dispense Refill Last Dose   acetaminophen (TYLENOL) 500 MG tablet Take 1,000 mg by mouth as needed for moderate pain.   09/09/2021   amLODipine (NORVASC) 10 MG tablet Take 1 tablet (10 mg total) by  mouth daily. (Patient taking differently: Take 10 mg by mouth daily as needed (Blood pressusre).) 30 tablet 5 unk   aspirin EC 81 MG tablet Take 1 tablet (81 mg total) by mouth daily with breakfast. 30 tablet 11 09/09/2021   atorvastatin (LIPITOR) 10 MG tablet Take 1 tablet (10 mg total) by mouth daily. (Patient taking differently: Take 10 mg by mouth at bedtime.) 30 tablet 5 09/08/2021   calcium carbonate (TUMS SMOOTHIES) 750 MG chewable tablet Chew 1,500 mg by mouth See admin instructions. Take 1500 mg with each meal and 1500 mg with each snack   09/09/2021   carvedilol (COREG) 6.25 MG tablet Take 1 tablet (6.25 mg total) by mouth 2 (two) times daily with a meal. (Patient taking differently: Take 6.25 mg by mouth 2 (two) times daily as needed (if BP > 150).) 60 tablet 0 Past Week at unk   diphenhydrAMINE (BENADRYL) 25 MG tablet Take 25 mg by mouth 2 (two) times daily as needed for allergies.   unk   docusate sodium (COLACE) 100 MG capsule Take 100 mg by mouth 2 (two) times  daily.   09/09/2021   gabapentin (NEURONTIN) 100 MG capsule Take 1 capsule (100 mg total) by mouth 2 (two) times daily. (Patient taking differently: Take 100 mg by mouth See admin instructions. Take 100 mg by mouth twice a day on non dialysis days. On dialysis day take 100 mg in the morning, 200 mg after dialysis, and then 100 mg at bedtime.) 60 capsule 0 09/09/2021   glipiZIDE (GLUCOTROL XL) 2.5 MG 24 hr tablet Take 1 tablet (2.5 mg total) by mouth daily with breakfast. 30 tablet 0 09/09/2021   HYDROcodone-acetaminophen (NORCO/VICODIN) 5-325 MG tablet Take 1-2 tablets by mouth every 4 (four) hours as needed for moderate pain. (Patient taking differently: Take 1 tablet by mouth every 4 (four) hours as needed for moderate pain.) 20 tablet 0 09/08/2021   insulin glargine (LANTUS) 100 UNIT/ML Solostar Pen Inject 12 Units into the skin daily.   Past Week   loperamide (IMODIUM A-D) 2 MG tablet Take 4 mg by mouth as needed for diarrhea or loose stools.    unk   nitroGLYCERIN (NITROSTAT) 0.4 MG SL tablet Place 1 tablet (0.4 mg total) under the tongue every 5 (five) minutes as needed for chest pain. 10 tablet 3 unk   pantoprazole (PROTONIX) 40 MG tablet Take 1 tablet (40 mg total) by mouth daily. 30 tablet 5 09/09/2021   simethicone (MYLICON) 035 MG chewable tablet Chew 125 mg by mouth in the morning and at bedtime.   09/09/2021   tiZANidine (ZANAFLEX) 4 MG capsule Take 4 mg by mouth in the morning and at bedtime.   09/09/2021   doxycycline (VIBRAMYCIN) 100 MG capsule Take 100 mg by mouth 2 (two) times daily. (Patient not taking: Reported on 09/10/2021)   Completed Course   lisinopril (ZESTRIL) 20 MG tablet Take 1 tablet (20 mg total) by mouth daily. (Patient not taking: Reported on 09/10/2021) 30 tablet 0 Not Taking   traMADol (ULTRAM) 50 MG tablet Take 1 tablet (50 mg total) by mouth every 6 (six) hours as needed for moderate pain. (Patient not taking: Reported on 09/10/2021) 20 tablet 0 Completed Course    Assessment: [name] presented to ED with c/o abdominal pain, constipation, nausea, and vomiting x 1 week. CT abdomen shows splenic infarction. Pharmacy consulted to dose and manage heparin infusion. Pt not on AC prior to admit.  Heparin level therapeutic on 2800 units/hr; no infusion issues or overt bleeding documented  Goal of Therapy:  Heparin level 0.3-0.7 units/ml Monitor platelets by anticoagulation protocol: Yes   Plan:  Continue heparin infusion at 2800 units/h Recheck heparin level in 8h Daily heparin level and CBC   Georga Bora, PharmD Clinical Pharmacist 09/14/2021 1:44 AM Please check AMION for all [place] numbers

## 2021-09-15 ENCOUNTER — Inpatient Hospital Stay (HOSPITAL_COMMUNITY): Payer: 59

## 2021-09-15 DIAGNOSIS — J9611 Chronic respiratory failure with hypoxia: Secondary | ICD-10-CM | POA: Diagnosis not present

## 2021-09-15 DIAGNOSIS — R778 Other specified abnormalities of plasma proteins: Secondary | ICD-10-CM | POA: Diagnosis not present

## 2021-09-15 DIAGNOSIS — N186 End stage renal disease: Secondary | ICD-10-CM | POA: Diagnosis not present

## 2021-09-15 DIAGNOSIS — D735 Infarction of spleen: Secondary | ICD-10-CM | POA: Diagnosis not present

## 2021-09-15 LAB — CULTURE, BLOOD (ROUTINE X 2)
Culture: NO GROWTH
Culture: NO GROWTH

## 2021-09-15 LAB — CBC
HCT: 29.8 % — ABNORMAL LOW (ref 36.0–46.0)
Hemoglobin: 8.7 g/dL — ABNORMAL LOW (ref 12.0–15.0)
MCH: 26.9 pg (ref 26.0–34.0)
MCHC: 29.2 g/dL — ABNORMAL LOW (ref 30.0–36.0)
MCV: 92 fL (ref 80.0–100.0)
Platelets: 206 10*3/uL (ref 150–400)
RBC: 3.24 MIL/uL — ABNORMAL LOW (ref 3.87–5.11)
RDW: 19.2 % — ABNORMAL HIGH (ref 11.5–15.5)
WBC: 22.4 10*3/uL — ABNORMAL HIGH (ref 4.0–10.5)
nRBC: 0 % (ref 0.0–0.2)

## 2021-09-15 LAB — GLUCOSE, CAPILLARY
Glucose-Capillary: 185 mg/dL — ABNORMAL HIGH (ref 70–99)
Glucose-Capillary: 297 mg/dL — ABNORMAL HIGH (ref 70–99)

## 2021-09-15 LAB — HEPARIN LEVEL (UNFRACTIONATED): Heparin Unfractionated: 0.47 IU/mL (ref 0.30–0.70)

## 2021-09-15 NOTE — Progress Notes (Signed)
[facility] Progress Note   Subjective:    Seen and examined patient at bedside. Patient's daughter also at bedside. She reports she needed a breathing treatment during HD yesterday. Tolerated net UF 2L. Remains on 4L [place] and denies CP.   Objective Vitals:   09/14/21 1900 09/14/21 2014 09/14/21 2235 09/15/21 0500  BP: (!) 185/75 133/75 (!) 157/71 (!) 173/87  Pulse: 91 90    Resp:  19    Temp: 98.1 F (36.7 C) 99.5 F (37.5 C)  97.7 F (36.5 C)  TempSrc: Oral Oral  Oral  SpO2: 100% 100%    Weight:      Height:       Physical Exam General: Ill-appearing; on 4L via [place]; NAD Heart:S1 and S2; No murmurs, gallops, or rubs Lungs: Clear anteriorly and posteriorly-uppers, laterally; no wheezing, rales, or rhonchi Abdomen: Large, soft, and non-tender Extremities: No edema LLE; R AKA-no edema noted at stump Dialysis Access: R [facility]   Filed Weights   09/12/21 2159 09/13/21 0332 09/14/21 1535  Weight: (!) 146.7 kg (!) 150.3 kg (!) 151 kg    Intake/Output Summary (Last 24 hours) at 09/15/2021 0826 Last data filed at 09/15/2021 0309 Gross per 24 hour  Intake 2832.17 ml  Output --  Net 2832.17 ml    Additional Objective Labs: Basic Metabolic Panel: Recent Labs  Lab 09/12/21 0225 09/12/21 2050 09/13/21 0751 09/14/21 0636  NA 130*  --  130* 130*  K 3.9  --  3.5 4.6  CL 92*  --  94* 93*  CO2 22  --  26 21*  GLUCOSE 74  --  104* 92  BUN 69*  --  40* 43*  CREATININE 11.84*  --  7.85* 8.16*  CALCIUM 7.7*  --  7.5* 5.9*  PHOS  --  5.9* 3.6 4.1   Liver Function Tests: Recent Labs  Lab 09/10/21 0031 09/10/21 0920 09/11/21 0324 09/14/21 0636  AST 18  --  30 16  ALT 11  --  11 10  ALKPHOS 79  --  76 124  BILITOT 0.7  --  0.8 0.7  PROT 7.8  --  7.1 6.0*  ALBUMIN 2.8* 2.5* 2.4* 1.9*  1.9*   Recent Labs  Lab 09/10/21 0031  LIPASE 37   CBC: Recent Labs  Lab 09/10/21 0031 09/10/21 0040 09/11/21 0324 09/12/21 0225 09/13/21 0751 09/14/21 0636  09/15/21 0608  WBC 14.2*   < > 44.3* 34.3* 26.0* 21.2* 22.4*  NEUTROABS 13.1*  --  39.5*  --   --  17.0*  --   HGB 9.5*   < > 9.0* 8.2* 8.3* 8.1* 8.7*  HCT 32.3*   < > 30.4* 27.5* 27.3* 26.2* 29.8*  MCV 95.0   < > 94.1 92.6 91.9 91.3 92.0  PLT 384   < > 328 274 203 190 206   < > = values in this interval not displayed.   Blood Culture    Component Value Date/Time   SDES BLOOD RIGHT FOREARM 09/10/2021 1600   SPECREQUEST  09/10/2021 1600    BOTTLES DRAWN AEROBIC ONLY Blood Culture results may not be optimal due to an inadequate volume of blood received in culture bottles   CULT  09/10/2021 1600    NO GROWTH 4 DAYS Performed at [facility], [place] [address], [place],  [address]    REPTSTATUS PENDING 09/10/2021 1600    Cardiac Enzymes: No results for input(s): "CKTOTAL", "CKMB", "CKMBINDEX", "TROPONINI" in the last 168 hours.  CBG: Recent Labs  Lab 09/13/21 1639 09/13/21 2201 09/14/21 0827 09/14/21 1219 09/15/21 0642  GLUCAP 146* 100* 123* 112* 185*   Iron Studies: No results for input(s): "IRON", "TIBC", "TRANSFERRIN", "FERRITIN" in the last 72 hours. Lab Results  Component Value Date   INR 1.3 (H) 09/10/2021   INR 1.3 (H) 07/10/2021   Studies/Results: No results found.  Medications:  sodium chloride Stopped (09/13/21 0341)   ceFEPime (MAXIPIME) IV Stopped (09/14/21 1226)   dextrose 75 mL/hr at 09/15/21 0309   heparin 2,800 Units/hr (09/15/21 0440)   vancomycin Stopped (09/14/21 1918)    atorvastatin  10 mg Oral QHS   calcitRIOL  1 mcg Oral Q T,Th,Sa-HD   calcium carbonate  1,500 mg Oral TID with meals   cinacalcet  30 mg Oral Q T,Th,Sa-HD   darbepoetin (ARANESP) injection - DIALYSIS  200 mcg Intravenous Q Thu-HD   diclofenac Sodium  2 g Topical QID   feeding supplement  1 Container Oral TID BM   gabapentin  100 mg Oral Q12H   gabapentin  200 mg Oral Q T,Th,Sa-HD   Gerhardt's butt cream   Topical BID   lactulose  30 g Oral TID   metoCLOPramide  (REGLAN) injection  10 mg Intravenous Q6H   midodrine  10 mg Oral Q8H   multivitamin  1 tablet Oral QHS   pantoprazole  40 mg Oral BID   polyethylene glycol  17 g Oral Daily   senna-docusate  1 tablet Oral BID   sodium chloride flush  10-40 mL Intracatheter Q12H   sodium chloride flush  3 mL Intravenous Q12H   sucralfate  1 g Oral TID WC & HS    Dialysis Orders: [name] TTS 4h 87min  151 kg  1K/2.5Ca bath  TDC  400 BFR - 4/29 > HBsAg neg w/ Abs < 10 Hep 2,600 load and 1,500 hourly  - mircera 200ug q2 due 09/10/21 - calcitriol 1 ug tiw - sensipar 30 mg tiw  Assessment/Plan: ESRD: On HD TTS. Continue Midodrine with HD. Next HD 6/13. Abdominal pain splenic infarct on CT: continue current pain management and heparin drip. Anemia of ESRD /acute illness-Hgb 8.7. Continue Aranesp 314 mcg Metabolic bone disease -PO4 now at goal. Plan to start binders once patient can tolerate solids. PAD status post right AKA/ LLE PAD: VVS consulted. Plan for staple removal in outpatient on 6/13-VVS to see here if she is still inpatient. 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 Continue pain management Chronic hypoxic respiratory failure: Managed by primary Diabetes mellitus type 2= per primary  Tobie Poet, NP [facility] 09/15/2021,8:26 AM  LOS: 5 days

## 2021-09-15 NOTE — Progress Notes (Signed)
Progress Note  Patient: Courtney Gray WRU:[id number] DOB: 1970/07/20  DOA: 09/09/2021  DOS: 09/15/2021    Brief hospital course: Jernie Schutt is a 51 y.o. 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 She was hypoglycemic, febrile with CT revealing hypoenhancement in the spleen consistent with infarct.   Assessment and Plan: Distributive shock with lactic acidosis: Pt has gangrene of left third digit and splenic infarcts which are the likely culprit for presentation. No definite nidus of infection currently noted.  - Continuing empiric abx (Vanc/Cefepime with dialysis) for now, WBC remains grossly elevated.  - Weaned from pressors 6/8. Was on midodrine but now becoming hypertensive, will DC midodrine. - Monitor blood cultures (NGTD x4 days)   IDT2DM uncontrolled with hypoglycemia: HbA1c 5.7% and recurrently hypoglycemic here.  - CBGs beginning to rise more consistently, so will stop D10 gtt and continue frequent checks.  - Encourage po intake. D/w pt and daughter. We removed NGT w/trickle feeds with plans for cor trak which the patient has declined - Hold insulins. Glucagon prn  Splenic infarct: No embolic source noted on echo or CT's. - Heparin gtt - Pain control with oral dilaudid.   Severe PAD s/p right AKA: Significant vascular compromise of LLE as well.  - VVS consulted, planning outpatient suture removal 6/13, will see here if still admitted. Recommended monitoring and protected LLE as this would be at high risk for amputation if wounded. - Continue statin, anticoagulation. Can restart ASA once more stable.  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  - Hand surgery consulted, recommending outpatient follow up to allow for evolutionary changes prior to  amputation.  - CT chest showed no embolizing proximal aortic lesions. - Vascular surgery stated she has multiphasic flow per recent duplex - Pain control: Gabapentin, tylenol, dilaudid  Acute on chronic hypoxic respiratory failure, suspected OHS, OSA:  - Continue supplemental O2 to maintain SpO2 90-95% to avoid risk of hypercarbia. Continue BiPAP qHS. Became more short of breath over past 24 hours. Will DC IVF, give breathing Tx prn, though no new adventitious lung sounds, exam is limited by habitus. Chest CXR. On abx and anticoagulation as above already. - Atelectasis and acute on chronic hypoventilation strongly suspected to be contributing. Needs incentive spirometry.  - Bronchodilator prn, no wheezing currently  GERD:  - Augmented PPI to BID, added carafate  Nausea:  - Continue zofran prn and compazine prn refractory symptoms.  ESRD:  - Back on TTS scheduled for HD per nephrology.    Poor IV access: s/p left EJ triple lumen catheter placed by IR 6/7. This is still needed at this time.  Morbid obesity: Body mass index is 55.4 kg/m.   HTN: Has been requiring midodrine.  - May need to reinitiate antihypertensive Tx (norvasc, coreg) if BPs rise after stopping midodrine.  Demand myocardial ischemia: Mild troponin elevation without angina.  - Defer further work up at this time.   Anemia of ESRD:  - Defer to nephrology whether ESA should be held. No active bleeding, will monitor hgb on anticoagulation.  HLD:  - Continue statin  Constipation: LBM 6/8 - Continue regular bowel regimen.   Stage 2 left and buttocks pressure injuries, POA:  - Offload as able.   Needs to start PT/OT, ordered. TOC consulted  for LTC vs. SNF placement  Subjective: Having no appetite and some nausea/vomiting attempts at po. Keeping down some crackers per daughter. She reports shortness of breath with some wheezing improved with breathing treatments yesterday in HD. No chest pain or fever. Hasn't been  OOB, did bed level activity with PT yesterday.  Objective: Vitals:   09/14/21 2014 09/14/21 2235 09/15/21 0500 09/15/21 1000  BP: 133/75 (!) 157/71 (!) 173/87   Pulse: 90     Resp: 19     Temp: 99.5 F (37.5 C)  97.7 F (36.5 C)   TempSrc: Oral  Oral   SpO2: 100%   99%  Weight:      Height:       Gen: 51 y.o. female in no distress Pulm: Nonlabored at rest without wheezes or crackles anterolaterally. CV: Regular rate and rhythm. Soft systolic murmur at base without any new murmur, rub, or gallop. UTD JVD, no pitting dependent edema. GI: Abdomen soft, not significantly tender, non-distended, with normoactive bowel sounds.  Ext: Warm, no deformities Skin: No new rashes, lesions or ulcers on visualized skin. Left 3rd finger with foam pad dressing c/d/I without spreading erythema or necrosis or odor at this time. Neuro: Alert and oriented. No focal neurological deficits. Psych: Judgement and insight appear fair. Mood euthymic & affect congruent. Behavior is appropriate.    Data Personally reviewed: CBC: Recent Labs  Lab 09/10/21 0031 09/10/21 0040 09/11/21 0324 09/12/21 0225 09/13/21 0751 09/14/21 0636 09/15/21 0608  WBC 14.2*   < > 44.3* 34.3* 26.0* 21.2* 22.4*  NEUTROABS 13.1*  --  39.5*  --   --  17.0*  --   HGB 9.5*   < > 9.0* 8.2* 8.3* 8.1* 8.7*  HCT 32.3*   < > 30.4* 27.5* 27.3* 26.2* 29.8*  MCV 95.0   < > 94.1 92.6 91.9 91.3 92.0  PLT 384   < > 328 274 203 190 206   < > = values in this interval not displayed.   Basic Metabolic Panel: Recent Labs  Lab 09/10/21 0920 09/10/21 1610 09/11/21 0324 09/12/21 0225 09/12/21 2050 09/13/21 0751 09/14/21 0636  NA 134* 135 132* 130*  --  130* 130*  K 3.3* 4.1 4.4 3.9  --  3.5 4.6  CL 97* 94* 94* 92*  --  94* 93*  CO2 19* 21* 22 22  --  26 21*  GLUCOSE 75 <20* 132* 74  --  104* 92  BUN 54* 57* 60* 69*  --  40* 43*  CREATININE 10.70* 10.95* 11.23* 11.84*  --  7.85* 8.16*  CALCIUM 8.0* 8.2* 7.9* 7.7*  --  7.5* 5.9*  MG   --   --  1.8  --  1.9 1.6*  --   PHOS 5.5*  --   --   --  5.9* 3.6 4.1   GFR: Estimated Creatinine Clearance: 12.2 mL/min (A) (by C-G formula based on SCr of 8.16 mg/dL (H)). Liver Function Tests: Recent Labs  Lab 09/10/21 0031 09/10/21 0920 09/11/21 0324 09/14/21 0636  AST 18  --  30 16  ALT 11  --  11 10  ALKPHOS 79  --  76 124  BILITOT 0.7  --  0.8 0.7  PROT 7.8  --  7.1 6.0*  ALBUMIN 2.8* 2.5* 2.4* 1.9*  1.9*   Recent Labs  Lab 09/10/21 0031  LIPASE 37   No results for input(s): "AMMONIA" in the last 168 hours. Coagulation Profile: Recent Labs  Lab 09/10/21 0403  INR 1.3*   Cardiac Enzymes: No results for input(s): "CKTOTAL", "CKMB", "CKMBINDEX", "TROPONINI" in the last 168 hours. BNP (last 3 results) No results for input(s): "PROBNP" in the last 8760 hours. HbA1C: No results for input(s): "HGBA1C" in the last 72 hours. CBG: Recent Labs  Lab 09/13/21 1639 09/13/21 2201 09/14/21 0827 09/14/21 1219 09/15/21 0642  GLUCAP 146* 100* 123* 112* 185*   Lipid Profile: No results for input(s): "CHOL", "HDL", "LDLCALC", "TRIG", "CHOLHDL", "LDLDIRECT" in the last 72 hours. Thyroid Function Tests: No results for input(s): "TSH", "T4TOTAL", "FREET4", "T3FREE", "THYROIDAB" in the last 72 hours. Anemia Panel: No results for input(s): "VITAMINB12", "FOLATE", "FERRITIN", "TIBC", "IRON", "RETICCTPCT" in the last 72 hours. Urine analysis: No results found for: "COLORURINE", "APPEARANCEUR", "LABSPEC", "PHURINE", "GLUCOSEU", "HGBUR", "BILIRUBINUR", "KETONESUR", "PROTEINUR", "UROBILINOGEN", "NITRITE", "LEUKOCYTESUR" Recent Results (from the past 240 hour(s))  Culture, blood (routine x 2)     Status: None (Preliminary result)   Collection Time: 09/10/21  5:15 AM   Specimen: BLOOD LEFT HAND  Result Value Ref Range Status   Specimen Description BLOOD LEFT HAND  Final   Special Requests   Final    BOTTLES DRAWN AEROBIC ONLY Blood Culture results may not be optimal due to an  inadequate volume of blood received in culture bottles   Culture   Final    NO GROWTH 4 DAYS Performed at [facility], [place] [address] [place], [place] [address]    Report Status PENDING  Incomplete  Culture, blood (routine x 2)     Status: None (Preliminary result)   Collection Time: 09/10/21  4:00 PM   Specimen: BLOOD RIGHT FOREARM  Result Value Ref Range Status   Specimen Description BLOOD RIGHT FOREARM  Final   Special Requests   Final    BOTTLES DRAWN AEROBIC ONLY Blood Culture results may not be optimal due to an inadequate volume of blood received in culture bottles   Culture   Final    NO GROWTH 4 DAYS Performed at [facility], [place] [address], [place], [place] [address]    Report Status PENDING  Incomplete  MRSA Next Gen by PCR, Nasal     Status: None   Collection Time: 09/10/21  5:36 PM   Specimen: Nasal Mucosa; Nasal Swab  Result Value Ref Range Status   MRSA by PCR Next Gen NOT DETECTED NOT DETECTED Final    Comment: (NOTE) The GeneXpert MRSA Assay (FDA approved for NASAL specimens only), is one component of a comprehensive MRSA colonization surveillance program. It is not intended to diagnose MRSA infection nor to guide or monitor treatment for MRSA infections. Test performance is not FDA approved in patients less than 77 years old. Performed at [facility], [place] [address], [place], [place] [address]      No results found.   Family Communication: Daughter at bedside 6/11  Disposition: Status is: Inpatient Remains inpatient appropriate because: Remains critically ill Planned Discharge Destination:  TBD  - SNF vs. LTC.  Patrecia Pour, MD 09/15/2021 12:25 PM Page by [email or web address]

## 2021-09-15 NOTE — Progress Notes (Signed)
Pt refusing bariatric bed and requested be transferred back to a standard hospital bed. Reports "Uncomfortable.... too wide" Provided but refused education. Transfer performed using MaxiMove. Bariatric bed return to [employer]. Leveda Anna, BSN, RN

## 2021-09-15 NOTE — Progress Notes (Signed)
Mobility Specialist Progress Note    09/15/21 1803  Mobility  Activity  (transferred bed to bed)  Level of Assistance +2 (takes two people)  Assistive Device MaxiMove  Activity Response Tolerated well  $Mobility charge 1 Mobility   Pt received requesting to change beds. Rns and Nts present. Left with RN present and call bell in reach.   Hildred Alamin Mobility Specialist

## 2021-09-15 NOTE — Evaluation (Signed)
Occupational Therapy Evaluation Patient Details Name: Courtney Gray MRN: [id number] DOB: Apr 12, 1970 Today's Date: 09/15/2021   History of Present Illness Pt is a 51 y.o. female admitted 09/09/21 with severe LUW abdominal pain, hypoglycemia. CT revealing hypoenhancement in the spleen consistent with infarct. 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 PMH includes PAD s/p recent R AKA (08/14/21), ESRD (HD TTS), CHF (chronic 2L O2), DM2, morbid obesity, gout, HTN, HLD.   Clinical Impression   Pt admitted for concerns listed above. PTA pt reported that she has been bed bound/WC bound since her AKA in May. Daughter provides mod to total A for all ADL's, and they call EMS for hoyer lifts to [facility] when she needs to go somewhere. At this time, pt continues to require total A for transfers and mod A for bed mobility. Additionally for ADL's she requires mod-max A +1-2, depending on tasks. Pt is motivated to assist and take part in her care, she is limited due to pain, weakness, and balance deficits. Recommending SNF at this time to maximize her independence and reduce caregiver burden. OT will follow acutely.       Recommendations for follow up therapy are one component of a multi-disciplinary discharge planning process, led by the attending physician.  Recommendations may be updated based on patient status, additional functional criteria and insurance authorization.   Follow Up Recommendations  Skilled nursing-short term rehab (<3 hours/day)    Assistance Recommended at Discharge Frequent or constant Supervision/Assistance  Patient can return home with the following Two people to help with walking and/or transfers;Two people to help with bathing/dressing/bathroom;Assistance with cooking/housework;Direct supervision/assist for medications management;Assist for transportation;Help with stairs or ramp for entrance    Functional Status Assessment  Patient has had a recent decline in their  functional status and demonstrates the ability to make significant improvements in function in a reasonable and predictable amount of time.  Equipment Recommendations  None recommended by OT (pt has all needed equipment)    Recommendations for Other Services       Precautions / Restrictions Precautions Precautions: Fall;Other (comment) Precaution Comments: H/o R AKA (08/2021), chronic 2L O2 Restrictions Weight Bearing Restrictions: Yes RLE Weight Bearing: Non weight bearing      Mobility Bed Mobility Overal bed mobility: Needs Assistance Bed Mobility: Supine to Sit     Supine to sit: Mod assist, HOB elevated     General bed mobility comments: Mod A to elevate trunk    Transfers                   General transfer comment: Deferred due toweakness      Balance Overall balance assessment: Needs assistance Sitting-balance support: No upper extremity supported, Feet unsupported Sitting balance-[name] Scale: Fair Sitting balance - Comments: can maintain static sitting EOB                                   ADL either performed or assessed with clinical judgement   ADL Overall ADL's : Needs assistance/impaired Eating/Feeding: Set up;Sitting   Grooming: Set up;Sitting;Bed level   Upper Body Bathing: Set up;Sitting   Lower Body Bathing: Maximal assistance;Bed level   Upper Body Dressing : Set up;Sitting   Lower Body Dressing: Maximal assistance;Bed level     Toilet Transfer Details (indicate cue type and reason): Bed level Toileting- Clothing Manipulation and Hygiene: Maximal assistance;Bed level  General ADL Comments: Pt limited by air mattress, weakness in core, and inability to stand at this time.     Vision Baseline Vision/History: 1 Wears glasses Ability to See in Adequate Light: 0 Adequate Patient Visual Report: No change from baseline Vision Assessment?: No apparent visual deficits     Perception     Praxis       Pertinent Vitals/Pain Pain Assessment Pain Assessment: Faces Faces Pain Scale: Hurts little more Pain Location: abdomen, R residual limb Pain Descriptors / Indicators: Discomfort Pain Intervention(s): Limited activity within patient's tolerance, Monitored during session     Hand Dominance Left   Extremity/Trunk Assessment Upper Extremity Assessment Upper Extremity Assessment: Overall WFL for tasks assessed   Lower Extremity Assessment Lower Extremity Assessment: Defer to PT evaluation RLE Deficits / Details: s/p R AKA (08/14/21)   Cervical / Trunk Assessment Cervical / Trunk Assessment: Other exceptions Cervical / Trunk Exceptions: increased body habitus   Communication Communication Communication: No difficulties   Cognition Arousal/Alertness: Awake/alert Behavior During Therapy: WFL for tasks assessed/performed, Flat affect Overall Cognitive Status: Within Functional Limits for tasks assessed                                       General Comments  VSS on RA, reviewed phantom limb pain relieving techniques    Exercises     Shoulder Instructions      Home Living Family/patient expects to be discharged to:: Private residence Living Arrangements: Children Available Help at Discharge: Family;Available PRN/intermittently Type of Home: Apartment Home Access: Ramped entrance Entrance Stairs-Number of Steps: 2 Entrance Stairs-Rails: None Home Layout: One level     Bathroom Shower/Tub: Tub/shower unit;Curtain   Bathroom Toilet: Standard Bathroom Accessibility: No   Home Equipment: Hand held shower head;Shower seat;Cane - single point;Grab bars - tub/shower;Rollator (4 wheels);Hospital bed   Additional Comments: lives with daughter      Prior Functioning/Environment Prior Level of Function : Needs assist             Mobility Comments: reports daughter cannot pump manual pump on hoyer lift well, so they cannot use it - pt calls fire dept  everytime she gets into/out of home. able to push manual w/c herself, but it does not fit in home well. ramp recently got installed. pt hopeful for ALF/LTC ADLs Comments: Daughter assists with bed-level ADL tasks        OT Problem List: Decreased strength;Decreased range of motion;Decreased activity tolerance;Impaired balance (sitting and/or standing);Decreased coordination;Decreased cognition;Decreased safety awareness;Decreased knowledge of use of DME or AE;Decreased knowledge of precautions;Obesity;Pain      OT Treatment/Interventions: Therapeutic exercise;Self-care/ADL training;Energy conservation;DME and/or AE instruction;Manual therapy;Therapeutic activities;Cognitive remediation/compensation;Patient/family education;Balance training    OT Goals(Current goals can be found in the care plan section) Acute Rehab OT Goals Patient Stated Goal: To get stronger and heal OT Goal Formulation: With patient Time For Goal Achievement: 09/29/21 Potential to Achieve Goals: Fair ADL Goals Pt Will Perform Lower Body Bathing: with mod assist;with adaptive equipment;sitting/lateral leans Pt Will Perform Lower Body Dressing: with mod assist;with adaptive equipment;sitting/lateral leans Additional ADL Goal #1: Pt will tolerate sitting EOB for 5+ mins with no support to complete ADL tasks. Additional ADL Goal #2: Pt willcomplete bed mobility with min guard, as a precursor to Seated ADL's.  OT Frequency: Min 2X/week    Co-evaluation              AM-PAC  OT "6 Clicks" Daily Activity     Outcome Measure Help from another person eating meals?: A Little Help from another person taking care of personal grooming?: A Little Help from another person toileting, which includes using toliet, bedpan, or urinal?: A Lot Help from another person bathing (including washing, rinsing, drying)?: A Lot Help from another person to put on and taking off regular upper body clothing?: A Little Help from another person  to put on and taking off regular lower body clothing?: Total 6 Click Score: 14   End of Session Equipment Utilized During Treatment: Oxygen Nurse Communication: Mobility status;Need for lift equipment  Activity Tolerance: Patient tolerated treatment well Patient left: in bed;with call bell/phone within reach;with family/visitor present  OT Visit Diagnosis: Other abnormalities of gait and mobility (R26.89);Unsteadiness on feet (R26.81);Muscle weakness (generalized) (M62.81);History of falling (Z91.81);Pain Pain - Right/Left: Right Pain - part of body: Leg                Time: 6060-0459 OT Time Calculation (min): 24 min Charges:  OT General Charges $OT Visit: 1 Visit OT Evaluation $OT Eval Moderate Complexity: 1 Mod OT Treatments $Self Care/Home Management : 8-22 mins  Elisavet Buehrer H., OTR/L Acute Rehabilitation  Jock Mahon Elane Sojourner Behringer 09/15/2021, 7:06 PM

## 2021-09-15 NOTE — Progress Notes (Signed)
ANTICOAGULATION CONSULT NOTE - Follow Up Consult  Pharmacy Consult for Heparin Indication:  Splenic infarction  Allergies  Allergen Reactions   Benzonatate Other (See Comments)    Hallucinations   Hydralazine Other (See Comments)    Hallucinations   Amoxicillin Itching and Nausea And Vomiting   Clonidine Other (See Comments)    Altered mental state, insomnia    Chlorhexidine Itching    Patient reports it is the CHG baths that cause her itching not the CHG used for caring for her IV/HD access.     Morphine Itching   Oxycodone Itching    Pt tolerated hospital admission 07/2021    Patient Measurements: Height: 5\' 5"  (165.1 cm) Weight: (!) 151 kg (332 lb 14.3 oz) IBW/kg (Calculated) : 57 Heparin Dosing Weight: 95.1 kg  Vital Signs: Temp: 97.7 F (36.5 C) (06/11 0500) Temp Source: Oral (06/11 0500) BP: 173/87 (06/11 0500) Pulse Rate: 90 (06/10 2014)  Labs: Recent Labs    09/13/21 0751 09/13/21 1345 09/13/21 2339 09/14/21 0636 09/14/21 0800 09/15/21 0608  HGB 8.3*  --   --  8.1*  --  8.7*  HCT 27.3*  --   --  26.2*  --  29.8*  PLT 203  --   --  190  --  206  HEPARINUNFRC  --    < > 0.36  --  0.36 0.47  CREATININE 7.85*  --   --  8.16*  --   --    < > = values in this interval not displayed.    Estimated Creatinine Clearance: 12.2 mL/min (A) (by C-G formula based on SCr of 8.16 mg/dL (H)).   Medications:  Scheduled:   atorvastatin  10 mg Oral QHS   calcitRIOL  1 mcg Oral Q T,Th,Sa-HD   calcium carbonate  1,500 mg Oral TID with meals   cinacalcet  30 mg Oral Q T,Th,Sa-HD   darbepoetin (ARANESP) injection - DIALYSIS  200 mcg Intravenous Q Thu-HD   diclofenac Sodium  2 g Topical QID   feeding supplement  1 Container Oral TID BM   gabapentin  100 mg Oral Q12H   gabapentin  200 mg Oral Q T,Th,Sa-HD   Gerhardt's butt cream   Topical BID   lactulose  30 g Oral TID   metoCLOPramide (REGLAN) injection  10 mg Intravenous Q6H   midodrine  10 mg Oral Q8H    multivitamin  1 tablet Oral QHS   pantoprazole  40 mg Oral BID   polyethylene glycol  17 g Oral Daily   senna-docusate  1 tablet Oral BID   sodium chloride flush  10-40 mL Intracatheter Q12H   sodium chloride flush  3 mL Intravenous Q12H   sucralfate  1 g Oral TID WC & HS   Infusions:   sodium chloride Stopped (09/13/21 0341)   ceFEPime (MAXIPIME) IV Stopped (09/14/21 1226)   dextrose 75 mL/hr at 09/15/21 0309   heparin 2,800 Units/hr (09/15/21 0440)   vancomycin Stopped (09/14/21 1918)    Assessment: [name] presented to ED with c/o abdominal pain, constipation, nausea, and vomiting x 1 week. CT abdomen shows splenic infarction. Pt not on anticoagulation PTA. Pharmacy consulted for heparin dosing.   Heparin level today is therapeutic at 0.47, on 2800 units/hr. Hgb 8.7, plt 206. No line issues or signs/symptoms of bleeding noted per RN.  Goal of Therapy:  Heparin level 0.3-0.7 units/ml Monitor platelets by anticoagulation protocol: Yes   Plan:  Continue IV heparin at 2800 units/hr. Daily  CBC, heparin level. Monitor for signs/symptoms of bleeding.   Vance Peper, PharmD PGY1 Pharmacy Resident Phone [phone] 09/15/2021 7:01 AM   Please check AMION for all [place] phone numbers After 10:00 PM, call [place] [phone]

## 2021-09-16 DIAGNOSIS — D735 Infarction of spleen: Secondary | ICD-10-CM | POA: Diagnosis not present

## 2021-09-16 LAB — BASIC METABOLIC PANEL
Anion gap: 13 (ref 5–15)
BUN: 31 mg/dL — ABNORMAL HIGH (ref 6–20)
CO2: 25 mmol/L (ref 22–32)
Calcium: 8.2 mg/dL — ABNORMAL LOW (ref 8.9–10.3)
Chloride: 91 mmol/L — ABNORMAL LOW (ref 98–111)
Creatinine, Ser: 6.95 mg/dL — ABNORMAL HIGH (ref 0.44–1.00)
GFR, Estimated: 7 mL/min — ABNORMAL LOW (ref >60)
Glucose, Bld: 89 mg/dL (ref 70–99)
Potassium: 4 mmol/L (ref 3.5–5.1)
Sodium: 129 mmol/L — ABNORMAL LOW (ref 135–145)

## 2021-09-16 LAB — HEPARIN LEVEL (UNFRACTIONATED): Heparin Unfractionated: 0.39 IU/mL (ref 0.30–0.70)

## 2021-09-16 LAB — CBC
HCT: 28.7 % — ABNORMAL LOW (ref 36.0–46.0)
Hemoglobin: 8.5 g/dL — ABNORMAL LOW (ref 12.0–15.0)
MCH: 27.5 pg (ref 26.0–34.0)
MCHC: 29.6 g/dL — ABNORMAL LOW (ref 30.0–36.0)
MCV: 92.9 fL (ref 80.0–100.0)
Platelets: 203 10*3/uL (ref 150–400)
RBC: 3.09 MIL/uL — ABNORMAL LOW (ref 3.87–5.11)
RDW: 19.6 % — ABNORMAL HIGH (ref 11.5–15.5)
WBC: 28.9 10*3/uL — ABNORMAL HIGH (ref 4.0–10.5)
nRBC: 0.1 % (ref 0.0–0.2)

## 2021-09-16 LAB — GLUCOSE, CAPILLARY: Glucose-Capillary: 96 mg/dL (ref 70–99)

## 2021-09-16 NOTE — Progress Notes (Signed)
  Progress Note    09/16/2021 8:33 AM  Subjective:  No complaints   Vitals:   09/16/21 0354 09/16/21 0449  BP:  (!) 157/97  Pulse: 89 93  Resp: (!) 22 19  Temp:  98.9 F (37.2 C)  SpO2: 100% 91%   Physical Exam: Lungs:  non labored Incisions:  R AKA healing well Neurologic: A&O  CBC    Component Value Date/Time   WBC 28.9 (H) 09/16/2021 0520   RBC 3.09 (L) 09/16/2021 0520   HGB 8.5 (L) 09/16/2021 0520   HCT 28.7 (L) 09/16/2021 0520   PLT 203 09/16/2021 0520   MCV 92.9 09/16/2021 0520   MCH 27.5 09/16/2021 0520   MCHC 29.6 (L) 09/16/2021 0520   RDW 19.6 (H) 09/16/2021 0520   LYMPHSABS 1.3 09/14/2021 0636   MONOABS 1.9 (H) 09/14/2021 0636   EOSABS 0.6 (H) 09/14/2021 0636   BASOSABS 0.1 09/14/2021 0636    BMET    Component Value Date/Time   NA 129 (L) 09/16/2021 0520   K 4.0 09/16/2021 0520   CL 91 (L) 09/16/2021 0520   CO2 25 09/16/2021 0520   GLUCOSE 89 09/16/2021 0520   BUN 31 (H) 09/16/2021 0520   CREATININE 6.95 (H) 09/16/2021 0520   CALCIUM 8.2 (L) 09/16/2021 0520   GFRNONAA 7 (L) 09/16/2021 0520   GFRAA 6 (L) 11/29/2019 0120    INR    Component Value Date/Time   INR 1.3 (H) 09/10/2021 0403     Intake/Output Summary (Last 24 hours) at 09/16/2021 2355 Last data filed at 09/16/2021 0600 Gross per 24 hour  Intake 1788.82 ml  Output --  Net 1788.82 ml     Assessment/Plan:  51 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

## 2021-09-16 NOTE — Progress Notes (Signed)
[facility] Progress Note   Subjective:   Reports she is very tired this AM. Felt SOB laying down but improved sitting up. CXR yesterday with evidence of volume overload. Says she is drinking very little fluid and does not think that is the issue. No chest pain or dizziness. Still not much of an appetite.   Objective Vitals:   09/15/21 1412 09/15/21 2039 09/16/21 0354 09/16/21 0449  BP: (!) 169/84 (!) 145/84  (!) 157/97  Pulse: 95 90 89 93  Resp: 20 17 (!) 22 19  Temp: 98.1 F (36.7 C) 99.3 F (37.4 C)  98.9 F (37.2 C)  TempSrc: Axillary Oral  Axillary  SpO2: 99% 100% 100% 91%  Weight:      Height:       Physical Exam General: Tired appearing female, alert and in NAD.  Heart: RRR, no murmurs, rubs or gallops Lungs: Diminished breath sounds due to body habitus. No wheezing, rhonchi or rales auscultated Abdomen: Soft, non-distended, +BS Extremities: No edema b/l lower extremities Dialysis Access: R [facility] with intact bandage  Additional Objective Labs: Basic Metabolic Panel: Recent Labs  Lab 09/12/21 2050 09/13/21 0751 09/14/21 0636 09/16/21 0520  NA  --  130* 130* 129*  K  --  3.5 4.6 4.0  CL  --  94* 93* 91*  CO2  --  26 21* 25  GLUCOSE  --  104* 92 89  BUN  --  40* 43* 31*  CREATININE  --  7.85* 8.16* 6.95*  CALCIUM  --  7.5* 5.9* 8.2*  PHOS 5.9* 3.6 4.1  --    Liver Function Tests: Recent Labs  Lab 09/10/21 0031 09/10/21 0920 09/11/21 0324 09/14/21 0636  AST 18  --  30 16  ALT 11  --  11 10  ALKPHOS 79  --  76 124  BILITOT 0.7  --  0.8 0.7  PROT 7.8  --  7.1 6.0*  ALBUMIN 2.8* 2.5* 2.4* 1.9*  1.9*   Recent Labs  Lab 09/10/21 0031  LIPASE 37   CBC: Recent Labs  Lab 09/10/21 0031 09/10/21 0040 09/11/21 0324 09/12/21 0225 09/13/21 0751 09/14/21 0636 09/15/21 0608 09/16/21 0520  WBC 14.2*   < > 44.3* 34.3* 26.0* 21.2* 22.4* 28.9*  NEUTROABS 13.1*  --  39.5*  --   --  17.0*  --   --   HGB 9.5*   < > 9.0* 8.2* 8.3* 8.1* 8.7*  8.5*  HCT 32.3*   < > 30.4* 27.5* 27.3* 26.2* 29.8* 28.7*  MCV 95.0   < > 94.1 92.6 91.9 91.3 92.0 92.9  PLT 384   < > 328 274 203 190 206 203   < > = values in this interval not displayed.   Blood Culture    Component Value Date/Time   SDES BLOOD RIGHT FOREARM 09/10/2021 1600   SPECREQUEST  09/10/2021 1600    BOTTLES DRAWN AEROBIC ONLY Blood Culture results may not be optimal due to an inadequate volume of blood received in culture bottles   CULT  09/10/2021 1600    NO GROWTH 5 DAYS Performed at [facility], [place] [address] [place], [place] [address]    REPTSTATUS 09/15/2021 FINAL 09/10/2021 1600    Cardiac Enzymes: No results for input(s): "CKTOTAL", "CKMB", "CKMBINDEX", "TROPONINI" in the last 168 hours. CBG: Recent Labs  Lab 09/13/21 2201 09/14/21 0827 09/14/21 1219 09/15/21 0642 09/15/21 1257  GLUCAP 100* 123* 112* 185* 297*   Iron Studies: No results  for input(s): "IRON", "TIBC", "TRANSFERRIN", "FERRITIN" in the last 72 hours. @lablastinr3 @ Studies/Results: DG CHEST PORT 1 VIEW  Result Date: 09/15/2021 CLINICAL DATA:  Hypoxia EXAM: PORTABLE CHEST 1 VIEW COMPARISON:  Chest radiograph done on 09/11/2021 CT done on 09/12/2021 FINDINGS: Transverse diameter of heart is increased. Central pulmonary vessels are more prominent. There is interval increase in interstitial and alveolar markings in the parahilar regions and lower lung fields. There is blunting of lateral CP angles suggesting small bilateral effusions. Tip of dialysis catheter is seen in the right atrium. Tip of left IJ central venous catheter is seen in the superior vena cava. IMPRESSION: Cardiomegaly. Central pulmonary vessels are more prominent suggesting CHF. Interval increase in interstitial and alveolar markings in both lungs suggest pulmonary edema. Small bilateral pleural effusions. Electronically Signed   By: Elmer Picker M.D.   On: 09/15/2021 13:34   Medications:  sodium chloride Stopped  (09/13/21 0341)   ceFEPime (MAXIPIME) IV Stopped (09/14/21 1226)   heparin 2,800 Units/hr (09/15/21 2315)   vancomycin Stopped (09/14/21 1918)    atorvastatin  10 mg Oral QHS   calcitRIOL  1 mcg Oral Q T,Th,Sa-HD   calcium carbonate  1,500 mg Oral TID with meals   cinacalcet  30 mg Oral Q T,Th,Sa-HD   darbepoetin (ARANESP) injection - DIALYSIS  200 mcg Intravenous Q Thu-HD   diclofenac Sodium  2 g Topical QID   feeding supplement  1 Container Oral TID BM   gabapentin  100 mg Oral Q12H   gabapentin  200 mg Oral Q T,Th,Sa-HD   Gerhardt's butt cream   Topical BID   lactulose  30 g Oral TID   metoCLOPramide (REGLAN) injection  10 mg Intravenous Q6H   multivitamin  1 tablet Oral QHS   pantoprazole  40 mg Oral BID   polyethylene glycol  17 g Oral Daily   senna-docusate  1 tablet Oral BID   sodium chloride flush  10-40 mL Intracatheter Q12H   sodium chloride flush  3 mL Intravenous Q12H   sucralfate  1 g Oral TID WC & HS    Dialysis Orders: [name] TTS 4h 30min  151 kg  1K/2.5Ca bath  TDC  400 BFR - 4/29 > HBsAg neg w/ Abs < 10 Hep 2,600 load and 1,500 hourly  - mircera 200ug q2 due 09/10/21 - calcitriol 1 ug tiw - sensipar 30 mg tiw  Assessment/Plan: ESRD: On HD TTS. CXR yesterday with evidence of volume overload. She is at her outpatient EDW but with very minimal PO intake, has likely lost some weight.Will try to increase UF goal with next HD.  Continue Midodrine with HD. Next HD 6/13. Abdominal pain splenic infarct on CT: continue current pain management and heparin drip. Anemia of ESRD /acute illness-Hgb 8.5. Continue Aranesp 917 mcg Metabolic bone disease -PO4 now at goal. Plan to start binders once patient can tolerate solids. PAD status post right AKA/ LLE PAD: VVS consulted. Plan for staple removal in outpatient on 6/13-VVS to see here if she is still inpatient. 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 Continue pain  management Chronic hypoxic respiratory failure: Managed by primary Diabetes mellitus type 2= per primary   Anice Paganini, PA-C 09/16/2021, 8:46 AM  [facility] Pager: [phone]

## 2021-09-16 NOTE — NC FL2 (Addendum)
[place] LEVEL OF CARE SCREENING TOOL     IDENTIFICATION  Patient Name: Courtney Gray Birthdate: 08-12-1970 Sex: female Admission Date (Current Location): 09/09/2021  [facility] and [place] Number:  Herbalist and Address:  [facility] [place], [place] [address] [place], [place] [address]      Provider Number: [id number]  Attending Physician Name and Address:  Little Ishikawa, MD  Relative Name and Phone Number:  Lorenso Courier [phone]    Current Level of Care: Hospital Recommended Level of Care: [place] Prior Approval Number:    Date Approved/Denied:   PASRR Number: [id number]  Discharge Plan: SNF    Current Diagnoses: Patient Active Problem List   Diagnosis Date Noted   Pressure injury of skin 09/13/2021   PVD (peripheral vascular disease) ([place])    Hx of AKA (above knee amputation), right ([place])    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   Pneumonia of left lower lobe due to infectious organism 09/10/2021   Lactic acidosis 09/10/2021   Mixed diabetic hyperlipidemia associated with type 2 diabetes mellitus ([place]) 09/10/2021   Coronary artery disease involving native coronary artery of native heart without angina pectoris 09/10/2021   Elevated troponin level not due myocardial infarction 09/10/2021   Hypotension 09/10/2021   Gangrene of left foot ([place])    Pain 08/03/2021   Ischemic foot pain at rest 08/03/2021   Hypoglycemia 07/10/2021   Volume overload 07/09/2021   Acute pulmonary edema ([place])    Acute gout 06/11/2021   Hyperkalemia 05/21/2021   Hemorrhoids    Hyperlipidemia 05/18/2021   Rectal bleeding 05/17/2021   Morbid obesity with BMI of 60.0-69.9, adult ([place]) 05/17/2021   NSTEMI (non-ST elevated myocardial infarction) ([place]) 05/16/2021   Essential hypertension 09/01/2020   ESRD (end stage renal disease) ([place]) 09/01/2020   Chronic respiratory failure with hypoxia ([place]) 09/01/2020   Hypertensive urgency  11/28/2019   Uncontrolled type 2 diabetes mellitus with hypoglycemia, with long-term current use of insulin ([place]) 11/28/2019   Anemia of chronic disease 11/28/2019    Orientation RESPIRATION BLADDER Height & Weight     Self, Time, Situation, Place (WDL)  O2 (Nasal Cannula 4 liters) Continent Weight: (!) 332 lb 14.3 oz (151 kg) Height:  5\' 5"  (165.1 cm)  BEHAVIORAL SYMPTOMS/MOOD NEUROLOGICAL BOWEL NUTRITION STATUS      Incontinent Diet (Please see discharge summary)  AMBULATORY STATUS COMMUNICATION OF NEEDS Skin   Extensive Assist Verbally Other (Comment) (Abrasion buttocks,bil.,PI buttocks,L,stage 2,foam lift dressing,clean,dry,intact,PI buttocks,R,stage 2,foam lift dressing,clean.dry,intact.Incis.closed,leg,R,clean,dry,intact,staples,please see additional info)                       Personal Care Assistance Level of Assistance  Bathing, Feeding, Dressing Bathing Assistance: Maximum assistance Feeding assistance: Independent (able to feed self,Renal fluid restriction) Dressing Assistance: Maximum assistance     Functional Limitations Info  Sight, Hearing, Speech   Hearing Info: Adequate Speech Info: Adequate    SPECIAL CARE FACTORS FREQUENCY  PT (By licensed PT), OT (By licensed OT)     PT Frequency: 5x min weekly OT Frequency: 5x min weekly            Contractures Contractures Info: Not present    Additional Factors Info  Code Status, Allergies Code Status Info: FULL Allergies Info: Benzonatate,Hydralazine,Amoxicillin,Clonidine,Chlorhexidine,Morphine,Oxycodone           Current Medications (09/16/2021):  This is the current hospital active medication list Current Facility-Administered Medications  Medication Dose Route Frequency Provider Last Rate Last Admin  0.9 %  sodium chloride infusion  250 mL Intravenous Continuous Frederik Pear, MD   Paused at 09/13/21 0341   acetaminophen (TYLENOL) tablet 650 mg  650 mg Oral Q6H PRN Shalhoub, Sherryll Burger, MD        Or   acetaminophen (TYLENOL) suppository 650 mg  650 mg Rectal Q6H PRN Vernelle Emerald, MD       albuterol (PROVENTIL) (2.5 MG/3ML) 0.083% nebulizer solution 2.5 mg  2.5 mg Nebulization Q3H PRN Vance Gather B, MD   2.5 mg at 09/15/21 1000   atorvastatin (LIPITOR) tablet 10 mg  10 mg Oral QHS Vernelle Emerald, MD   10 mg at 09/14/21 2240   calcitRIOL (ROCALTROL) capsule 1 mcg  1 mcg Oral Q T,Th,Sa-HD Ernest Haber, PA-C   1 mcg at 09/14/21 1153   calcium carbonate (TUMS - dosed in mg elemental calcium) chewable tablet 1,500 mg  1,500 mg Oral TID with meals Shalhoub, Sherryll Burger, MD   1,500 mg at 09/16/21 0820   calcium carbonate (TUMS - dosed in mg elemental calcium) chewable tablet 1,500 mg  1,500 mg Oral PRN Shalhoub, Sherryll Burger, MD       ceFEPIme (MAXIPIME) 2 g in sodium chloride 0.9 % 100 mL IVPB  2 g Intravenous Q T,Th,Sa-HD Margaretha Seeds, MD   Stopped at 09/14/21 1226   cinacalcet (SENSIPAR) tablet 30 mg  30 mg Oral Q T,Th,Sa-HD Ernest Haber, PA-C       Darbepoetin Alfa (ARANESP) injection 200 mcg  200 mcg Intravenous Q Thu-HD Zeyfang, David, PA-C       diclofenac Sodium (VOLTAREN) 1 % topical gel 2 g  2 g Topical QID Lisette Abu, PA-C   2 g at 09/15/21 2314   feeding supplement (BOOST / RESOURCE BREEZE) liquid 1 Container  1 Container Oral TID BM Patrecia Pour, MD   1 Container at 09/16/21 1000   gabapentin (NEURONTIN) capsule 100 mg  100 mg Oral Q12H Einar Grad, RPH   100 mg at 09/16/21 4132   gabapentin (NEURONTIN) capsule 200 mg  200 mg Oral Q T,Th,Sa-HD Einar Grad, [facility]       Gerhardt's butt cream   Topical BID Marty Heck, MD   Given at 09/16/21 1259   glucagon (human recombinant) (GLUCAGEN) injection 1 mg  1 mg Intramuscular Once PRN Jacky Kindle, MD       glucose chewable tablet 4 g  1 tablet Oral PRN Noe Gens L, NP       heparin ADULT infusion 100 units/mL (25000 units/255mL)  2,800 Units/hr Intravenous Continuous Einar Grad, RPH 28  mL/hr at 09/15/21 2315 2,800 Units/hr at 09/15/21 2315   HYDROmorphone (DILAUDID) injection 0.5 mg  0.5 mg Intravenous Q6H PRN Vance Gather B, MD   0.5 mg at 09/16/21 4401   HYDROmorphone (DILAUDID) tablet 2 mg  2 mg Oral Q3H PRN Vance Gather B, MD   2 mg at 09/14/21 1648   iohexol (OMNIPAQUE) 300 MG/ML solution 100 mL  100 mL Intravenous Once PRN Suttle, Rosanne Ashing, MD       labetalol (NORMODYNE) injection 20 mg  20 mg Intravenous Q2H PRN Shalhoub, Sherryll Burger, MD       lactulose (CHRONULAC) 10 GM/15ML solution 30 g  30 g Oral TID Jacky Kindle, MD   30 g at 09/16/21 0819   lidocaine (PF) (XYLOCAINE) 1 % injection 5 mL  5 mL Intradermal PRN Shalhoub, Sherryll Burger, MD  lidocaine (XYLOCAINE) 1 % (with pres) injection    PRN Suttle, Rosanne Ashing, MD   15 mL at 09/10/21 1920   lidocaine-prilocaine (EMLA) cream 1 application.  1 application  Topical PRN Shalhoub, Sherryll Burger, MD       metoCLOPramide (REGLAN) injection 10 mg  10 mg Intravenous Q6H Margaretha Seeds, MD   10 mg at 09/16/21 0519   multivitamin (RENA-VIT) tablet 1 tablet  1 tablet Oral QHS Margaretha Seeds, MD   1 tablet at 09/14/21 2240   nitroGLYCERIN (NITROSTAT) SL tablet 0.4 mg  0.4 mg Sublingual Q5 min PRN Shalhoub, Sherryll Burger, MD       ondansetron [facility]) tablet 4 mg  4 mg Oral Q6H PRN Shalhoub, Sherryll Burger, MD       Or   ondansetron [facility]) injection 4 mg  4 mg Intravenous Q6H PRN Shalhoub, Sherryll Burger, MD   4 mg at 09/15/21 1224   pantoprazole (PROTONIX) EC tablet 40 mg  40 mg Oral BID Patrecia Pour, MD   40 mg at 09/16/21 0819   pentafluoroprop-tetrafluoroeth (GEBAUERS) aerosol 1 application.  1 application  Topical PRN Shalhoub, Sherryll Burger, MD       polyethylene glycol (MIRALAX / GLYCOLAX) packet 17 g  17 g Oral Daily Ollis, Brandi L, NP   17 g at 09/10/21 2147   prochlorperazine (COMPAZINE) injection 5 mg  5 mg Intravenous Q6H PRN Patrecia Pour, MD       senna-docusate (Senokot-S) tablet 1 tablet  1 tablet Oral BID Noe Gens L, NP   1 tablet at  09/16/21 0820   sodium chloride flush (NS) 0.9 % injection 10-40 mL  10-40 mL Intracatheter Q12H Chand, Currie Paris, MD   20 mL at 09/16/21 1300   sodium chloride flush (NS) 0.9 % injection 3 mL  3 mL Intravenous Q12H Shalhoub, Sherryll Burger, MD   3 mL at 09/15/21 2314   vancomycin (VANCOCIN) IVPB 1000 mg/200 mL premix  1,000 mg Intravenous Q T,Th,Sa-HD Shalhoub, Sherryll Burger, MD   Stopped at 09/14/21 1918     Discharge Medications: Please see discharge summary for a list of discharge medications.  Relevant Imaging Results:  Relevant Lab Results:   Additional Information [id number],Both Covids 1 Booster, Wound Incision open or dehiced puncture finger anterior,L,black scabbed wound. HD at [facility] on TTS. Pt arrives at 10:15 for 10:30 chair time.   Milas Gain, LCSWA

## 2021-09-16 NOTE — Progress Notes (Signed)
Pt receives out-pt HD at [name] on TTS. Pt arrives at 10:15 for 10:30 chair time. Will assist as needed.  ? ?Courtney Gray ?Renal Navigator ?[phone] ?

## 2021-09-16 NOTE — Progress Notes (Signed)
Pharmacy Antibiotic Note  Courtney Gray is a 51 y.o. female admitted on 09/09/2021 with sepsis. Source unclear - pt has splenic infarct, recent AKA, and recent L hand infection (on doxycycline PTA). Pharmacy has been consulted for Vancomycin and Cefepime dosing. Pt is ESRD on HD TTS. -WBC= 28.9, afebrile -blood cultures negative  Plan: Continue cefepime 2g IV qHD TTS Vancomycin 1000mg  qHD TTS Will check a pre-HD vancomycin level on 6/13   Temp (24hrs), Avg:98.8 F (37.1 C), Min:98.1 F (36.7 C), Max:99.3 F (37.4 C)  Recent Labs  Lab 09/10/21 0530 09/10/21 0920 09/10/21 1610 09/11/21 0324 09/11/21 1809 09/12/21 0225 09/13/21 0751 09/14/21 0636 09/15/21 0608 09/16/21 0520  WBC  --  36.0*  --  44.3*  --  34.3* 26.0* 21.2* 22.4* 28.9*  CREATININE  --  10.70*   < > 11.23*  --  11.84* 7.85* 8.16*  --  6.95*  LATICACIDVEN 3.7* 2.2*  --   --  0.9  --   --   --   --   --    < > = values in this interval not displayed.     Estimated Creatinine Clearance: 14.3 mL/min (A) (by C-G formula based on SCr of 6.95 mg/dL (H)).    Allergies  Allergen Reactions   Benzonatate Other (See Comments)    Hallucinations   Hydralazine Other (See Comments)    Hallucinations   Amoxicillin Itching and Nausea And Vomiting   Clonidine Other (See Comments)    Altered mental state, insomnia    Chlorhexidine Itching    Patient reports it is the CHG baths that cause her itching not the CHG used for caring for her IV/HD access.     Morphine Itching   Oxycodone Itching    Pt tolerated hospital admission 07/2021    Antimicrobials this admission: Cefepime 6/6 >>  Vancomycin 6/6 >>    Microbiology results: 6/6 BCx: Neg  Thank you for allowing pharmacy to be a part of this patient's care.   Hildred Laser, PharmD Clinical Pharmacist **Pharmacist phone directory can now be found on [email or web address] ([email or web address]).  Listed under [place].

## 2021-09-16 NOTE — Progress Notes (Signed)
ANTICOAGULATION CONSULT NOTE - Follow Up Consult  Pharmacy Consult for Heparin Indication:  Splenic infarction  Allergies  Allergen Reactions   Benzonatate Other (See Comments)    Hallucinations   Hydralazine Other (See Comments)    Hallucinations   Amoxicillin Itching and Nausea And Vomiting   Clonidine Other (See Comments)    Altered mental state, insomnia    Chlorhexidine Itching    Patient reports it is the CHG baths that cause her itching not the CHG used for caring for her IV/HD access.     Morphine Itching   Oxycodone Itching    Pt tolerated hospital admission 07/2021    Patient Measurements: Height: 5\' 5"  (165.1 cm) Weight: (!) 151 kg (332 lb 14.3 oz) IBW/kg (Calculated) : 57 Heparin Dosing Weight: 95.1 kg  Vital Signs: Temp: 98.9 F (37.2 C) (06/12 0449) Temp Source: Axillary (06/12 0449) BP: 157/97 (06/12 0449) Pulse Rate: 93 (06/12 0449)  Labs: Recent Labs    09/14/21 0636 09/14/21 0800 09/15/21 0608 09/16/21 0520  HGB 8.1*  --  8.7* 8.5*  HCT 26.2*  --  29.8* 28.7*  PLT 190  --  206 203  HEPARINUNFRC  --  0.36 0.47 0.39  CREATININE 8.16*  --   --  6.95*     Estimated Creatinine Clearance: 14.3 mL/min (A) (by C-G formula based on SCr of 6.95 mg/dL (H)).   Medications:  Scheduled:   atorvastatin  10 mg Oral QHS   calcitRIOL  1 mcg Oral Q T,Th,Sa-HD   calcium carbonate  1,500 mg Oral TID with meals   cinacalcet  30 mg Oral Q T,Th,Sa-HD   darbepoetin (ARANESP) injection - DIALYSIS  200 mcg Intravenous Q Thu-HD   diclofenac Sodium  2 g Topical QID   feeding supplement  1 Container Oral TID BM   gabapentin  100 mg Oral Q12H   gabapentin  200 mg Oral Q T,Th,Sa-HD   Gerhardt's butt cream   Topical BID   lactulose  30 g Oral TID   metoCLOPramide (REGLAN) injection  10 mg Intravenous Q6H   multivitamin  1 tablet Oral QHS   pantoprazole  40 mg Oral BID   polyethylene glycol  17 g Oral Daily   senna-docusate  1 tablet Oral BID   sodium chloride  flush  10-40 mL Intracatheter Q12H   sodium chloride flush  3 mL Intravenous Q12H   sucralfate  1 g Oral TID WC & HS   Infusions:   sodium chloride Stopped (09/13/21 0341)   ceFEPime (MAXIPIME) IV Stopped (09/14/21 1226)   heparin 2,800 Units/hr (09/15/21 2315)   vancomycin Stopped (09/14/21 1918)    Assessment: [name] presented to ED with c/o abdominal pain, constipation, nausea, and vomiting x 1 week. CT abdomen shows splenic infarction. Pt not on anticoagulation PTA. Pharmacy consulted for heparin dosing.   Heparin level today is therapeutic at 0.39, on 2800 units/hr. Hgb 8.5.  Goal of Therapy:  Heparin level 0.3-0.7 units/ml Monitor platelets by anticoagulation protocol: Yes   Plan:  Continue IV heparin at 2800 units/hr. Daily CBC, heparin level.   Hildred Laser, PharmD Clinical Pharmacist **Pharmacist phone directory can now be found on [email or web address] ([email or web address]).  Listed under [place].

## 2021-09-16 NOTE — Progress Notes (Signed)
RT placed patient on CPAP HS. 6L O2 bleed in needed. Patient tolerating well at this time.

## 2021-09-16 NOTE — TOC Initial Note (Signed)
Transition of Care [facility]) - Initial/Assessment Note    Patient Details  Name: Courtney Gray MRN: [id number] Date of Birth: May 03, 1970  Transition of Care [facility]) CM/SW Contact:    Milas Gain, [place] Phone Number: 09/16/2021, 2:32 PM  Clinical Narrative:                  CSW received consult for possible long term SNF placement at time of discharge. CSW spoke with patient regarding PT recommendation of long term SNF placement at time of discharge. Patient reports she comes from home with daughter.Patient expressed understanding of PT recommendation and is agreeable to long term SNF placement at time of discharge. Patient gave CSW permission to fax [place] area. CSW discussed insurance authorization process with patient. Patient reports she has received the COVID vaccines as well as 1 booster.  No further questions reported at this time. CSW to continue to follow and assist with discharge planning needs.   Expected Discharge Plan: Skilled Nursing Facility Barriers to Discharge: Continued Medical Work up   Patient Goals and CMS Choice Patient states their goals for this hospitalization and ongoing recovery are:: SNF CMS Medicare.gov Compare Post Acute Care list provided to:: Patient Choice offered to / list presented to : Patient, Adult Children (Daughter, Antionette)  Expected Discharge Plan and Services Expected Discharge Plan: Skilled Nursing Facility In-house Referral: Clinical Social Work Discharge Planning Services: CM Consult Post Acute Care Choice: Durable Medical Equipment Living arrangements for the past 2 months: Single Family Home                                      Prior Living Arrangements/Services Living arrangements for the past 2 months: Single Family Home Lives with:: Adult Children Patient language and need for interpreter reviewed:: Yes Do you feel safe going back to the place  where you live?: No   SNF  Need for Family Participation in Patient Care: Yes (Comment) Care giver support system in place?: Yes (comment) Current home services: DME (Cane, w/c, shower sest, rollator, home O2) Criminal Activity/Legal Involvement Pertinent to Current Situation/Hospitalization: No - Comment as needed  Activities of Daily Living      Permission Sought/Granted Permission sought to share information with : Case Manager, Family Supports, Chartered certified accountant granted to share information with : Yes, Verbal Permission Granted  Share Information with NAME: Antoinette  Permission granted to share info w AGENCY: SNF  Permission granted to share info w Relationship: daughter  Permission granted to share info w Contact Information: Lorenso Courier [phone]  Emotional Assessment Appearance:: Appears stated age Attitude/Demeanor/Rapport: Gracious Affect (typically observed): Calm Orientation: : Oriented to Self, Oriented to Place, Oriented to  Time, Oriented to Situation (WDL) Alcohol / Substance Use: Not Applicable Psych Involvement: No (comment)  Admission diagnosis:  Splenic infarction [D73.5] Hypoglycemia [E16.2] ESRD (end stage renal disease) on dialysis ([place]) [N18.6, Z99.2] Hypotension [I95.9] Sepsis, due to unspecified organism, unspecified whether acute organ dysfunction present [facility]) [A41.9] Left upper quadrant abdominal pain [R10.12] Patient Active Problem List   Diagnosis Date Noted   Pressure injury of skin 09/13/2021   PVD (peripheral vascular disease) ([place])    Hx of AKA (above knee amputation), right ([place])    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   Pneumonia of left lower lobe due to infectious organism 09/10/2021   Lactic acidosis 09/10/2021   Mixed diabetic hyperlipidemia associated with type 2 diabetes  mellitus ([place]) 09/10/2021   Coronary artery disease involving native coronary artery of native heart without angina pectoris 09/10/2021    Elevated troponin level not due myocardial infarction 09/10/2021   Hypotension 09/10/2021   Gangrene of left foot ([place])    Pain 08/03/2021   Ischemic foot pain at rest 08/03/2021   Hypoglycemia 07/10/2021   Volume overload 07/09/2021   Acute pulmonary edema (H[facility])    Acute gout 06/11/2021   Hyperkalemia 05/21/2021   Hemorrhoids    Hyperlipidemia 05/18/2021   Rectal bleeding 05/17/2021   Morbid obesity with BMI of 60.0-69.9, adult ([place]) 05/17/2021   NSTEMI (non-ST elevated myocardial infarction) ([place]) 05/16/2021   Essential hypertension 09/01/2020   ESRD (end stage renal disease) ([place]) 09/01/2020   Chronic respiratory failure with hypoxia ([place]) 09/01/2020   Hypertensive urgency 11/28/2019   Uncontrolled type 2 diabetes mellitus with hypoglycemia, with long-term current use of insulin ([place]) 11/28/2019   Anemia of chronic disease 11/28/2019   PCP:  Monico Blitz, MD Pharmacy:   [facility] [place] [address] [place] [address] Phone: [phone] Fax: [phone]  [facility] [address] [place] [address] Phone: [phone] Fax: [phone]     Social Determinants of Health (SDOH) Interventions    Readmission Risk Interventions    08/28/2021   11:50 AM 07/12/2021   12:38 PM 05/23/2021    1:12 PM  Readmission Risk Prevention Plan  Transportation Screening Complete Complete Complete  Medication Review Press photographer) Complete Complete Complete  PCP or Specialist appointment within 3-5 days of discharge Complete Complete Complete  HRI or Home Care Consult Complete Complete Complete  SW Recovery Care/Counseling Consult Complete Complete Complete  Palliative Care Screening Not Applicable Not Applicable Not [place] Not Applicable Complete Not Applicable

## 2021-09-16 NOTE — Progress Notes (Signed)
Progress Note  Patient: Courtney Gray MBW:[id number] DOB: Apr 23, 1970  DOA: 09/09/2021  DOS: 09/16/2021    Brief hospital course: Tandrea Kommer is a 51 y.o. 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 She was hypoglycemic, febrile with CT revealing hypoenhancement in the spleen consistent with infarct.   Assessment and Plan:  Distributive shock with lactic acidosis and presumed acute infection:  - Pt has gangrene of left third digit and splenic infarcts which are the likely culprit for presentation. No definite nidus of infection currently noted.  - Continuing empiric abx (Vanc/Cefepime with dialysis) for now, WBC remains grossly elevated.  - Weaned from pressors 6/8. Was on midodrine but now becoming hypertensive, will DC midodrine. - Monitor blood cultures (NGTD x4 days)   IDT2DM uncontrolled with hypoglycemia: HbA1c 5.7% and recurrently hypoglycemic here requiring D10 at one point (now off). - Encourage po intake. D/w pt and daughter. We removed NGT w/trickle feeds with plans for cor trak which the patient has declined - Hold insulins. Glucagon prn - May need to restart sliding scale in near future pending labs  Splenic infarct:  - No embolic source noted on echo or CT's - Unclear if possibly infectious vs embolic given above - Heparin gtt - Pain control with oral dilaudid.   Severe PAD s/p right AKA:  - Significant vascular compromise of LLE as well.  - VVS consulted, planning outpatient suture removal 6/13, will see here if still admitted. Recommended monitoring and protected LLE as this would be at high risk for amputation if wounded. - Continue statin, anticoagulation. Can restart ASA once more stable.  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  -  Hand surgery consulted, recommending outpatient follow up to allow for evolutionary changes prior to amputation.  - CT chest showed no embolizing proximal aortic lesions. - Vascular surgery stated she has multiphasic flow per recent duplex - Pain control: Gabapentin, tylenol, dilaudid  Acute on chronic hypoxic respiratory failure, suspected OHS, OSA:  - Continue supplemental O2 to maintain SpO2 90-95% to avoid risk of hypercarbia. Continue BiPAP qHS. Became more short of breath over past 24 hours. Will DC IVF, give breathing Tx prn, though no new adventitious lung sounds, exam is limited by habitus. Chest CXR. On abx and anticoagulation as above already. - Atelectasis and acute on chronic hypoventilation strongly suspected to be contributing. Needs incentive spirometry.  - Bronchodilator prn, no wheezing currently  GERD:  - Augmented PPI to BID, added carafate  Nausea:  - Continue zofran prn and compazine prn refractory symptoms.  ESRD:  - Back on TTS scheduled for HD per nephrology.    Poor IV access: s/p left EJ triple lumen catheter placed by IR 6/7. This is still needed at this time.  Morbid obesity: Body mass index is 55.4 kg/m.   HTN: Has been requiring midodrine.  - May need to reinitiate antihypertensive Tx (norvasc, coreg) if BPs rise after stopping midodrine.  Demand myocardial ischemia: Mild troponin elevation without angina.  - Defer further work up at this time.   Anemia of ESRD:  - Defer to nephrology whether ESA should be held. No active bleeding, will monitor hgb on anticoagulation.  HLD:  - Continue statin  Constipation: LBM 6/8 - Continue regular bowel regimen.   Stage  2 left and buttocks pressure injuries, POA:  - Offload as able.   Needs to start PT/OT, ordered. TOC consulted for LTC vs. SNF placement  Subjective: No acute issues or events overnight  Objective: Vitals:   09/15/21 1412 09/15/21 2039 09/16/21 0354 09/16/21 0449  BP: (!) 169/84 (!)  145/84  (!) 157/97  Pulse: 95 90 89 93  Resp: 20 17 (!) 22 19  Temp: 98.1 F (36.7 C) 99.3 F (37.4 C)  98.9 F (37.2 C)  TempSrc: Axillary Oral  Axillary  SpO2: 99% 100% 100% 91%  Weight:      Height:       Gen: 51 y.o. female in no distress Pulm: Nonlabored at rest without wheezes or crackles anterolaterally. CV: Regular rate and rhythm. Soft systolic murmur at base without any new murmur, rub, or gallop. UTD JVD, no pitting dependent edema. GI: Abdomen soft, not significantly tender, non-distended, with normoactive bowel sounds.  Ext: Warm, no deformities Skin: No new rashes, lesions or ulcers on visualized skin. Left 3rd finger with foam pad dressing c/d/I without spreading erythema or necrosis or odor at this time. Neuro: Alert and oriented. No focal neurological deficits. Psych: Judgement and insight appear fair. Mood euthymic & affect congruent. Behavior is appropriate.    Data Personally reviewed: CBC: Recent Labs  Lab 09/10/21 0031 09/10/21 0040 09/11/21 0324 09/12/21 0225 09/13/21 0751 09/14/21 0636 09/15/21 0608 09/16/21 0520  WBC 14.2*   < > 44.3* 34.3* 26.0* 21.2* 22.4* 28.9*  NEUTROABS 13.1*  --  39.5*  --   --  17.0*  --   --   HGB 9.5*   < > 9.0* 8.2* 8.3* 8.1* 8.7* 8.5*  HCT 32.3*   < > 30.4* 27.5* 27.3* 26.2* 29.8* 28.7*  MCV 95.0   < > 94.1 92.6 91.9 91.3 92.0 92.9  PLT 384   < > 328 274 203 190 206 203   < > = values in this interval not displayed.    Basic Metabolic Panel: Recent Labs  Lab 09/10/21 0920 09/10/21 1610 09/11/21 0324 09/12/21 0225 09/12/21 2050 09/13/21 0751 09/14/21 0636 09/16/21 0520  NA 134*   < > 132* 130*  --  130* 130* 129*  K 3.3*   < > 4.4 3.9  --  3.5 4.6 4.0  CL 97*   < > 94* 92*  --  94* 93* 91*  CO2 19*   < > 22 22  --  26 21* 25  GLUCOSE 75   < > 132* 74  --  104* 92 89  BUN 54*   < > 60* 69*  --  40* 43* 31*  CREATININE 10.70*   < > 11.23* 11.84*  --  7.85* 8.16* 6.95*  CALCIUM 8.0*   < > 7.9* 7.7*  --  7.5*  5.9* 8.2*  MG  --   --  1.8  --  1.9 1.6*  --   --   PHOS 5.5*  --   --   --  5.9* 3.6 4.1  --    < > = values in this interval not displayed.    GFR: Estimated Creatinine Clearance: 14.3 mL/min (A) (by C-G formula based on SCr of 6.95 mg/dL (H)). Liver Function Tests: Recent Labs  Lab 09/10/21 0031 09/10/21 0920 09/11/21 0324 09/14/21 0636  AST 18  --  30 16  ALT 11  --  11 10  ALKPHOS 79  --  76 124  BILITOT 0.7  --  0.8 0.7  PROT 7.8  --  7.1 6.0*  ALBUMIN 2.8* 2.5* 2.4* 1.9*  1.9*    Recent Labs  Lab 09/10/21 0031  LIPASE 37    No results for input(s): "AMMONIA" in the last 168 hours. Coagulation Profile: Recent Labs  Lab 09/10/21 0403  INR 1.3*    Cardiac Enzymes: No results for input(s): "CKTOTAL", "CKMB", "CKMBINDEX", "TROPONINI" in the last 168 hours. BNP (last 3 results) No results for input(s): "PROBNP" in the last 8760 hours. HbA1C: No results for input(s): "HGBA1C" in the last 72 hours. CBG: Recent Labs  Lab 09/13/21 2201 09/14/21 0827 09/14/21 1219 09/15/21 0642 09/15/21 1257  GLUCAP 100* 123* 112* 185* 297*    Lipid Profile: No results for input(s): "CHOL", "HDL", "LDLCALC", "TRIG", "CHOLHDL", "LDLDIRECT" in the last 72 hours. Thyroid Function Tests: No results for input(s): "TSH", "T4TOTAL", "FREET4", "T3FREE", "THYROIDAB" in the last 72 hours. Anemia Panel: No results for input(s): "VITAMINB12", "FOLATE", "FERRITIN", "TIBC", "IRON", "RETICCTPCT" in the last 72 hours. Urine analysis: No results found for: "COLORURINE", "APPEARANCEUR", "LABSPEC", "PHURINE", "GLUCOSEU", "HGBUR", "BILIRUBINUR", "KETONESUR", "PROTEINUR", "UROBILINOGEN", "NITRITE", "LEUKOCYTESUR" Recent Results (from the past 240 hour(s))  Culture, blood (routine x 2)     Status: None   Collection Time: 09/10/21  5:15 AM   Specimen: BLOOD LEFT HAND  Result Value Ref Range Status   Specimen Description BLOOD LEFT HAND  Final   Special Requests   Final    BOTTLES DRAWN  AEROBIC ONLY Blood Culture results may not be optimal due to an inadequate volume of blood received in culture bottles   Culture   Final    NO GROWTH 5 DAYS Performed at [facility], [place] [address], [place], [place] [address]    Report Status 09/15/2021 FINAL  Final  Culture, blood (routine x 2)     Status: None   Collection Time: 09/10/21  4:00 PM   Specimen: BLOOD RIGHT FOREARM  Result Value Ref Range Status   Specimen Description BLOOD RIGHT FOREARM  Final   Special Requests   Final    BOTTLES DRAWN AEROBIC ONLY Blood Culture results may not be optimal due to an inadequate volume of blood received in culture bottles   Culture   Final    NO GROWTH 5 DAYS Performed at [facility], [place] [address] [place], [place] [address]    Report Status 09/15/2021 FINAL  Final  MRSA Next Gen by PCR, Nasal     Status: None   Collection Time: 09/10/21  5:36 PM   Specimen: Nasal Mucosa; Nasal Swab  Result Value Ref Range Status   MRSA by PCR Next Gen NOT DETECTED NOT DETECTED Final    Comment: (NOTE) The GeneXpert MRSA Assay (FDA approved for NASAL specimens only), is one component of a comprehensive MRSA colonization surveillance program. It is not intended to diagnose MRSA infection nor to guide or monitor treatment for MRSA infections. Test performance is not FDA approved in patients less than 89 years old. Performed at [facility], [place] [address], [place], [place] [address]      DG CHEST PORT 1 VIEW  Result Date: 09/15/2021 CLINICAL DATA:  Hypoxia EXAM: PORTABLE CHEST 1 VIEW COMPARISON:  Chest radiograph done on 09/11/2021 CT done on 09/12/2021 FINDINGS: Transverse diameter of heart is increased. Central pulmonary vessels are more prominent. There is interval increase in interstitial and alveolar markings in the parahilar regions and lower lung fields. There is blunting of lateral CP angles  suggesting small bilateral effusions. Tip of dialysis catheter is seen in the right  atrium. Tip of left IJ central venous catheter is seen in the superior vena cava. IMPRESSION: Cardiomegaly. Central pulmonary vessels are more prominent suggesting CHF. Interval increase in interstitial and alveolar markings in both lungs suggest pulmonary edema. Small bilateral pleural effusions. Electronically Signed   By: Elmer Picker M.D.   On: 09/15/2021 13:34     Family Communication: Daughter at bedside 6/11  Disposition: Status is: Inpatient Remains inpatient appropriate because: Remains critically ill Planned Discharge Destination:  TBD  - SNF vs. LTC.  Little Ishikawa, MD 09/16/2021 7:58 AM Page by [email or web address]

## 2021-09-17 DIAGNOSIS — D735 Infarction of spleen: Secondary | ICD-10-CM | POA: Diagnosis not present

## 2021-09-17 LAB — HEPARIN LEVEL (UNFRACTIONATED)
Heparin Unfractionated: 0.27 IU/mL — ABNORMAL LOW (ref 0.30–0.70)
Heparin Unfractionated: 0.3 IU/mL (ref 0.30–0.70)

## 2021-09-17 LAB — GLUCOSE, CAPILLARY
Glucose-Capillary: 75 mg/dL (ref 70–99)
Glucose-Capillary: 109 mg/dL — ABNORMAL HIGH (ref 70–99)
Glucose-Capillary: 252 mg/dL — ABNORMAL HIGH (ref 70–99)

## 2021-09-17 LAB — CBC
HCT: 29 % — ABNORMAL LOW (ref 36.0–46.0)
Hemoglobin: 8.7 g/dL — ABNORMAL LOW (ref 12.0–15.0)
MCH: 27.9 pg (ref 26.0–34.0)
MCHC: 30 g/dL (ref 30.0–36.0)
MCV: 92.9 fL (ref 80.0–100.0)
Platelets: 214 10*3/uL (ref 150–400)
RBC: 3.12 MIL/uL — ABNORMAL LOW (ref 3.87–5.11)
RDW: 20 % — ABNORMAL HIGH (ref 11.5–15.5)
WBC: 29.9 10*3/uL — ABNORMAL HIGH (ref 4.0–10.5)
nRBC: 0.1 % (ref 0.0–0.2)

## 2021-09-17 MED ORDER — HEPARIN SODIUM (PORCINE) 1000 UNIT/ML IJ SOLN
INTRAMUSCULAR | Status: AC
  Filled 2021-09-17: qty 4

## 2021-09-17 NOTE — Progress Notes (Signed)
[facility] Progress Note   Subjective:   Seen on HD, UF goal 3L and tolerating well. Reports SOB is somewhat improved today. Denies CP, dizziness, abdominal pain and nausea.   Objective Vitals:   09/17/21 0728 09/17/21 0729 09/17/21 0900 09/17/21 0927  BP:  (!) 152/74 (!) 157/68 (!) 162/96  Pulse:  92 96   Resp:  18 17   Temp:      TempSrc:      SpO2:  100% 100%   Weight: (!) 158.6 kg     Height:       Physical Exam General: Alert female in NAD Heart: RRR, no murmurs, rubs or gallops Lungs: Diminished breath sounds due to body habitus. No wheezing, rhonchi or rales auscultated Abdomen: Soft, non-distended, +BS Extremities: No edema b/l lower extremities Dialysis Access: [facility] accessed  Additional Objective Labs: Basic Metabolic Panel: Recent Labs  Lab 09/12/21 2050 09/13/21 0751 09/14/21 0636 09/16/21 0520  NA  --  130* 130* 129*  K  --  3.5 4.6 4.0  CL  --  94* 93* 91*  CO2  --  26 21* 25  GLUCOSE  --  104* 92 89  BUN  --  40* 43* 31*  CREATININE  --  7.85* 8.16* 6.95*  CALCIUM  --  7.5* 5.9* 8.2*  PHOS 5.9* 3.6 4.1  --    Liver Function Tests: Recent Labs  Lab 09/11/21 0324 09/14/21 0636  AST 30 16  ALT 11 10  ALKPHOS 76 124  BILITOT 0.8 0.7  PROT 7.1 6.0*  ALBUMIN 2.4* 1.9*  1.9*   No results for input(s): "LIPASE", "AMYLASE" in the last 168 hours. CBC: Recent Labs  Lab 09/11/21 0324 09/12/21 0225 09/13/21 0751 09/14/21 0636 09/15/21 0608 09/16/21 0520 09/17/21 0530  WBC 44.3*   < > 26.0* 21.2* 22.4* 28.9* 29.9*  NEUTROABS 39.5*  --   --  17.0*  --   --   --   HGB 9.0*   < > 8.3* 8.1* 8.7* 8.5* 8.7*  HCT 30.4*   < > 27.3* 26.2* 29.8* 28.7* 29.0*  MCV 94.1   < > 91.9 91.3 92.0 92.9 92.9  PLT 328   < > 203 190 206 203 214   < > = values in this interval not displayed.   Blood Culture    Component Value Date/Time   SDES BLOOD RIGHT FOREARM 09/10/2021 1600   SPECREQUEST  09/10/2021 1600    BOTTLES DRAWN AEROBIC ONLY  Blood Culture results may not be optimal due to an inadequate volume of blood received in culture bottles   CULT  09/10/2021 1600    NO GROWTH 5 DAYS Performed at [facility], [place] [address] [place], [place] [address]    REPTSTATUS 09/15/2021 FINAL 09/10/2021 1600    Cardiac Enzymes: No results for input(s): "CKTOTAL", "CKMB", "CKMBINDEX", "TROPONINI" in the last 168 hours. CBG: Recent Labs  Lab 09/14/21 0827 09/14/21 1219 09/15/21 0642 09/15/21 1257 09/16/21 2117  GLUCAP 123* 112* 185* 297* 96   Iron Studies: No results for input(s): "IRON", "TIBC", "TRANSFERRIN", "FERRITIN" in the last 72 hours. @lablastinr3 @ Studies/Results: DG CHEST PORT 1 VIEW  Result Date: 09/15/2021 CLINICAL DATA:  Hypoxia EXAM: PORTABLE CHEST 1 VIEW COMPARISON:  Chest radiograph done on 09/11/2021 CT done on 09/12/2021 FINDINGS: Transverse diameter of heart is increased. Central pulmonary vessels are more prominent. There is interval increase in interstitial and alveolar markings in the parahilar regions and lower lung fields. There is  blunting of lateral CP angles suggesting small bilateral effusions. Tip of dialysis catheter is seen in the right atrium. Tip of left IJ central venous catheter is seen in the superior vena cava. IMPRESSION: Cardiomegaly. Central pulmonary vessels are more prominent suggesting CHF. Interval increase in interstitial and alveolar markings in both lungs suggest pulmonary edema. Small bilateral pleural effusions. Electronically Signed   By: Elmer Picker M.D.   On: 09/15/2021 13:34   Medications:  sodium chloride Stopped (09/13/21 0341)   ceFEPime (MAXIPIME) IV Stopped (09/14/21 1226)   heparin 2,800 Units/hr (09/17/21 0053)   vancomycin Stopped (09/14/21 1918)    atorvastatin  10 mg Oral QHS   calcitRIOL  1 mcg Oral Q T,Th,Sa-HD   calcium carbonate  1,500 mg Oral TID with meals   cinacalcet  30 mg Oral Q T,Th,Sa-HD   darbepoetin (ARANESP) injection - DIALYSIS  200  mcg Intravenous Q Thu-HD   diclofenac Sodium  2 g Topical QID   feeding supplement  1 Container Oral TID BM   gabapentin  100 mg Oral Q12H   gabapentin  200 mg Oral Q T,Th,Sa-HD   Gerhardt's butt cream   Topical BID   lactulose  30 g Oral TID   metoCLOPramide (REGLAN) injection  10 mg Intravenous Q6H   multivitamin  1 tablet Oral QHS   pantoprazole  40 mg Oral BID   polyethylene glycol  17 g Oral Daily   senna-docusate  1 tablet Oral BID   sodium chloride flush  10-40 mL Intracatheter Q12H   sodium chloride flush  3 mL Intravenous Q12H    Dialysis Orders: [name] TTS 4h 36min  151 kg  1K/2.5Ca bath  TDC  400 BFR - 4/29 > HBsAg neg w/ Abs < 10 Hep 2,600 load and 1,500 hourly  - mircera 200ug q2 due 09/10/21 - calcitriol 1 ug tiw - sensipar 30 mg tiw  Assessment/Plan: ESRD: On HD TTS. CXR 09/15/21 with evidence of volume overload. She is at her outpatient EDW but with very minimal PO intake, has likely lost some weight. Tolerating increased UF goal today.  Continue Midodrine with HD.  Abdominal pain splenic infarct on CT: continue current pain management and heparin drip. Anemia of ESRD /acute illness-Hgb 8.7. Continue Aranesp 200 mcg weekly Metabolic bone disease -PO4 now at goal. Plan to start binders once patient can tolerate solids. PAD status post right AKA/ LLE PAD: VVS consulted. Plan for staple removal in outpatient -VVS to see here if she is still inpatient. 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 Continue pain management Chronic hypoxic respiratory failure: Managed by primary Diabetes mellitus type 2= per primary   Anice Paganini, PA-C 09/17/2021, 10:12 AM  [facility] Pager: [phone]

## 2021-09-17 NOTE — TOC Progression Note (Addendum)
Transition of Care [facility]) - Progression Note    Patient Details  Name: Yocelyn Brocious MRN: [id number] Date of Birth: [birth year]  Transition of Care [facility]) CM/SW [place], [place] Phone Number: 09/17/2021, 2:13 PM  Clinical Narrative:     Update- CSW received email from Shanon Rosser with financial counseling who informed CSW that patient has MAD Medicaid  The accepting facility would have to notify the DSS caseworker of her change in need along with the state approved FL2 before her LTC benefit would be eligible. CSW awaiting determination from [facility] and\ [place]. [facility] confirmed they cannot accept patient for LTC placement. CSW received call from [place] with [facility] who confirmed they cannot offer LTC bed for patient. CSW awaiting determination with [place].     CSW awaiting callback from [facility], [place] to see if they can offer LTC bed for patient. CSW spoke with patients daughter who gave CSW permission to reach out to financial counseling to check to see if patients has long term medicaid.CSW emailed Shanon Rosser with financial counseling. CSW will continue to follow and assist with patients dc planning needs.  Expected Discharge Plan: Skilled Nursing Facility Barriers to Discharge: Continued Medical Work up  Expected Discharge Plan and Services Expected Discharge Plan: [place] In-house Referral: Clinical Social Work Discharge Planning Services: CM Consult Post Acute Care Choice: Durable Medical Equipment Living arrangements for the past 2 months: Single Family Home                                       Social Determinants of Health (SDOH) Interventions    Readmission Risk Interventions    09/16/2021    4:36 PM 08/28/2021   11:50 AM 07/12/2021   12:38 PM  Readmission Risk Prevention Plan  Transportation Screening Complete Complete Complete  Medication Review Press photographer)  Complete Complete  PCP or  Specialist appointment within 3-5 days of discharge  Complete Complete  HRI or Home Care Consult Complete Complete Complete  SW Recovery Care/Counseling Consult Complete Complete Complete  Palliative Care Screening  Not Applicable Not Applicable  Skilled Nursing Facility Complete Not Applicable Complete

## 2021-09-17 NOTE — Plan of Care (Signed)

## 2021-09-17 NOTE — Progress Notes (Signed)
Physical Therapy Treatment Patient Details Name: Courtney Gray MRN: [id number] DOB: 1970/09/12 Today's Date: 09/17/2021   History of Present Illness Pt is a 51 y.o. female admitted 09/09/21 with severe LUW abdominal pain, hypoglycemia. CT revealing hypoenhancement in the spleen consistent with infarct. 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 PMH includes PAD s/p recent R AKA (08/14/21), ESRD (HD TTS), CHF (chronic 2L O2), DM2, morbid obesity, gout, HTN, HLD.    PT Comments    Pt received supine with HOB elevated and agreeable to bed level and seated exercise. Pt able to come to long sitting without physical assist and dangle LLE off EOB with mi nguard for safety. Pt turning hips slightly toward EOB but declining scooting/turing to become square with EOB. Pt with fair tolerance for LE therex in sitting and supine with pt able to perform against light graded resistance. Pt educated on importance of continued mobility and HEP compliance for increased strength, as well as importance of repositioning and OOB mobility, pt verbalizing understanding but declining OOB mobility. Pt continues to benefit from skilled PT services to progress toward functional mobility goals.    Recommendations for follow up therapy are one component of a multi-disciplinary discharge planning process, led by the attending physician.  Recommendations may be updated based on patient status, additional functional criteria and insurance authorization.  Follow Up Recommendations  Long-term institutional care without follow-up therapy     Assistance Recommended at Discharge Intermittent Supervision/Assistance  Patient can return home with the following Two people to help with walking and/or transfers;A lot of help with bathing/dressing/bathroom;Assistance with cooking/housework;Assist for transportation;Help with stairs or ramp for entrance   Equipment Recommendations  Other (comment) (eletric hoyer lift)     Recommendations for Other Services       Precautions / Restrictions Precautions Precautions: Fall;Other (comment) Precaution Comments: H/o R AKA (08/2021), chronic 2L O2 Restrictions Weight Bearing Restrictions: Yes RLE Weight Bearing: Non weight bearing     Mobility  Bed Mobility Overal bed mobility: Needs Assistance Bed Mobility: Supine to Sit, Sit to Supine     Supine to sit: HOB elevated, Min guard Sit to supine: Mod assist   General bed mobility comments: pt able to come to long sitting and partially EOB with LLE off bed and on floor but declining to turn whole body toward EOB without physical assist, assist to scoot back up in bed on return to supine    Transfers                   General transfer comment: Deferred due toweakness    Ambulation/Gait                   Stairs             Wheelchair Mobility    Modified Rankin (Stroke Patients Only)       Balance Overall balance assessment: Needs assistance Sitting-balance support: No upper extremity supported, Feet unsupported Sitting balance-[name] Scale: Fair Sitting balance - Comments: can maintain static sitting EOB                                    Cognition Arousal/Alertness: Awake/alert Behavior During Therapy: WFL for tasks assessed/performed, Flat affect Overall Cognitive Status: Within Functional Limits for tasks assessed  Exercises General Exercises - Lower Extremity Ankle Circles/Pumps: AROM, Left, 10 reps, Supine Long Arc Quad: AROM, Strengthening, Left, 10 reps, Seated Hip ABduction/ADduction: AROM, Left, Right, 10 reps, Supine Straight Leg Raises: AROM, AAROM, Left, 5 reps, Supine Hip Flexion/Marching: AROM, Left, 10 reps, Seated    General Comments General comments (skin integrity, edema, etc.): VSS on RA      Pertinent Vitals/Pain Pain Assessment Pain Assessment: Faces Faces Pain  Scale: Hurts little more Pain Location: abdomen, R residual limb, L shoulder Pain Descriptors / Indicators: Discomfort Pain Intervention(s): Monitored during session, Repositioned    Home Living                          Prior Function            PT Goals (current goals can now be found in the care plan section) Acute Rehab PT Goals PT Goal Formulation: With patient Time For Goal Achievement: 09/28/21    Frequency    Min 2X/week      PT Plan      Co-evaluation              AM-PAC PT "6 Clicks" Mobility   Outcome Measure  Help needed turning from your back to your side while in a flat bed without using bedrails?: A Lot Help needed moving from lying on your back to sitting on the side of a flat bed without using bedrails?: A Lot Help needed moving to and from a bed to a chair (including a wheelchair)?: Total Help needed standing up from a chair using your arms (e.g., wheelchair or bedside chair)?: Total Help needed to walk in hospital room?: Total Help needed climbing 3-5 steps with a railing? : Total 6 Click Score: 8    End of Session Equipment Utilized During Treatment: Oxygen Activity Tolerance: Patient tolerated treatment well Patient left: in bed;with call bell/phone within reach;with family/visitor present Nurse Communication: Mobility status PT Visit Diagnosis: Other abnormalities of gait and mobility (R26.89);Muscle weakness (generalized) (M62.81) Pain - Right/Left: Right Pain - part of body: Leg     Time: 1530-1559 PT Time Calculation (min) (ACUTE ONLY): 29 min  Charges:  $Therapeutic Exercise: 8-22 mins $Therapeutic Activity: 8-22 mins                     Ariela Mochizuki R. PTA Acute Rehabilitation Services Office: [place] 09/17/2021, 4:11 PM

## 2021-09-17 NOTE — Progress Notes (Signed)
Patient refused to wear CPAP tonight. Vital signs stab;e at this time.

## 2021-09-17 NOTE — Progress Notes (Signed)
Progress Note  Patient: Courtney Gray FYB:[id number] DOB: 1971/01/19  DOA: 09/09/2021  DOS: 09/17/2021    Brief hospital course: Courtney Gray is a 51 y.o. 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 She was hypoglycemic, febrile with CT revealing hypoenhancement in the spleen consistent with infarct.   Assessment and Plan:  Distributive shock with lactic acidosis and presumed acute infection:  - Pt has gangrene of left third digit and splenic infarcts which are the likely culprit for presentation. No other definite nidus of infection currently noted.  - Continuing empiric abx (Cefepime with dialysis) for now, WBC remains grossly elevated. Will de-escalate abx - DC vancomycin 6/13 and follow clinically. - Weaned from pressors 6/8. Was on midodrine but now becoming hypertensive, will DC midodrine. - Monitor blood cultures (NGTD x4 days)   IDT2DM uncontrolled with hypoglycemia:  - HbA1c 5.7% and recurrently hypoglycemic here requiring D10 at one point (now off). - Encourage po intake. D/w pt and daughter. We removed NGT w/trickle feeds with plans for cor trak which the patient has declined - Hold insulins. Glucagon prn - May need to restart sliding scale in near future pending labs  Splenic infarct:  - No embolic source noted on echo or CT's - Unclear if possibly infectious vs embolic given above - Heparin gtt - Pain control with oral dilaudid.   Severe PAD s/p right AKA:  - Significant vascular compromise of LLE as well.  - VVS consulted, planning outpatient suture removal 6/13, will see here if still admitted. Recommended monitoring and protected LLE as this would be at high risk for amputation if wounded. - Continue statin, anticoagulation. Can restart ASA once more  stable.  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  - Hand surgery consulted, recommending outpatient follow up to allow for evolutionary changes prior to amputation.  - CT chest showed no embolizing proximal aortic lesions. - Vascular surgery stated she has multiphasic flow per recent duplex - Pain control: Gabapentin, tylenol, dilaudid  Acute on chronic hypoxic respiratory failure, suspected OHS, OSA:  - Continue supplemental O2 to maintain SpO2 90-95% to avoid risk of hypercarbia. Continue BiPAP qHS. Became more short of breath over past 24 hours. Will DC IVF, give breathing Tx prn, though no new adventitious lung sounds, exam is limited by habitus. Chest CXR. On abx and anticoagulation as above already. - Atelectasis and acute on chronic hypoventilation strongly suspected to be contributing. Needs incentive spirometry.  - Bronchodilator prn, no wheezing currently  GERD:  - Augmented PPI to BID, added carafate  Nausea:  - Continue zofran prn and compazine prn refractory symptoms.  ESRD:  - Back on TTS scheduled for HD per nephrology.    Poor IV access: s/p left EJ triple lumen catheter placed by IR 6/7. This is still needed at this time.  Morbid obesity: Body mass index is 58.18 kg/m.   HTN: Has been requiring midodrine.  - May need to reinitiate antihypertensive Tx (norvasc, coreg) if BPs rise after stopping midodrine.  Demand myocardial ischemia: Mild troponin elevation without angina.  - Defer further work up at this time.   Anemia of ESRD:  - Defer to nephrology whether ESA should be held. No active bleeding, will monitor hgb on anticoagulation.  HLD:  - Continue statin  Constipation: LBM 6/8 - Continue regular bowel regimen.   Stage 2 left and buttocks pressure injuries, POA:  - Offload as able.   Needs to start PT/OT, ordered. TOC consulted for LTC vs. SNF placement  Subjective: No acute issues or events overnight  Objective: Vitals:    09/16/21 1948 09/17/21 0509 09/17/21 0728 09/17/21 0729  BP: (!) 155/82 (!) 158/88  (!) 152/74  Pulse: 94 94  92  Resp: 20 15  18   Temp: 98.7 F (37.1 C) 99 F (37.2 C)    TempSrc: Oral Oral    SpO2: 99% 100%  100%  Weight:   (!) 158.6 kg   Height:       Gen: 51 y.o. female in no distress Pulm: Nonlabored at rest without wheezes or crackles anterolaterally. CV: Regular rate and rhythm. Soft systolic murmur at base without any new murmur, rub, or gallop. UTD JVD, no pitting dependent edema. GI: Abdomen soft, not significantly tender, non-distended, with normoactive bowel sounds.  Ext: Warm, no deformities Skin: No new rashes, lesions or ulcers on visualized skin. Left 3rd finger with foam pad dressing c/d/I without spreading erythema or necrosis or odor at this time. Neuro: Alert and oriented. No focal neurological deficits. Psych: Judgement and insight appear fair. Mood euthymic & affect congruent. Behavior is appropriate.    Data Personally reviewed: CBC: Recent Labs  Lab 09/11/21 0324 09/12/21 0225 09/13/21 0751 09/14/21 0636 09/15/21 0608 09/16/21 0520 09/17/21 0530  WBC 44.3*   < > 26.0* 21.2* 22.4* 28.9* 29.9*  NEUTROABS 39.5*  --   --  17.0*  --   --   --   HGB 9.0*   < > 8.3* 8.1* 8.7* 8.5* 8.7*  HCT 30.4*   < > 27.3* 26.2* 29.8* 28.7* 29.0*  MCV 94.1   < > 91.9 91.3 92.0 92.9 92.9  PLT 328   < > 203 190 206 203 214   < > = values in this interval not displayed.    Basic Metabolic Panel: Recent Labs  Lab 09/10/21 0920 09/10/21 1610 09/11/21 0324 09/12/21 0225 09/12/21 2050 09/13/21 0751 09/14/21 0636 09/16/21 0520  NA 134*   < > 132* 130*  --  130* 130* 129*  K 3.3*   < > 4.4 3.9  --  3.5 4.6 4.0  CL 97*   < > 94* 92*  --  94* 93* 91*  CO2 19*   < > 22 22  --  26 21* 25  GLUCOSE 75   < > 132* 74  --  104* 92 89  BUN 54*   < > 60* 69*  --  40* 43* 31*  CREATININE 10.70*   < > 11.23* 11.84*  --  7.85* 8.16* 6.95*  CALCIUM 8.0*   < > 7.9* 7.7*  --  7.5*  5.9* 8.2*  MG  --   --  1.8  --  1.9 1.6*  --   --   PHOS 5.5*  --   --   --  5.9* 3.6 4.1  --    < > = values in this interval not displayed.    GFR: Estimated Creatinine Clearance: 14.8 mL/min (A) (by C-G formula based on SCr of 6.95 mg/dL (H)). Liver Function Tests: Recent Labs  Lab 09/10/21 0920 09/11/21 0324 09/14/21 0636  AST  --  30 16  ALT  --  11 10  ALKPHOS  --  76 124  BILITOT  --  0.8 0.7  PROT  --  7.1 6.0*  ALBUMIN 2.5* 2.4* 1.9*  1.9*    No results for input(s): "LIPASE", "AMYLASE" in the last 168 hours.  No results for input(s): "AMMONIA" in the last 168 hours. Coagulation Profile: No results for input(s): "INR", "PROTIME" in the last 168 hours.  Cardiac Enzymes: No results for input(s): "CKTOTAL", "CKMB", "CKMBINDEX", "TROPONINI" in the last 168 hours. BNP (last 3 results) No results for input(s): "PROBNP" in the last 8760 hours. HbA1C: No results for input(s): "HGBA1C" in the last 72 hours. CBG: Recent Labs  Lab 09/14/21 0827 09/14/21 1219 09/15/21 0642 09/15/21 1257 09/16/21 2117  GLUCAP 123* 112* 185* 297* 96    Lipid Profile: No results for input(s): "CHOL", "HDL", "LDLCALC", "TRIG", "CHOLHDL", "LDLDIRECT" in the last 72 hours. Thyroid Function Tests: No results for input(s): "TSH", "T4TOTAL", "FREET4", "T3FREE", "THYROIDAB" in the last 72 hours. Anemia Panel: No results for input(s): "VITAMINB12", "FOLATE", "FERRITIN", "TIBC", "IRON", "RETICCTPCT" in the last 72 hours. Urine analysis: No results found for: "COLORURINE", "APPEARANCEUR", "LABSPEC", "PHURINE", "GLUCOSEU", "HGBUR", "BILIRUBINUR", "KETONESUR", "PROTEINUR", "UROBILINOGEN", "NITRITE", "LEUKOCYTESUR" Recent Results (from the past 240 hour(s))  Culture, blood (routine x 2)     Status: None   Collection Time: 09/10/21  5:15 AM   Specimen: BLOOD LEFT HAND  Result Value Ref Range Status   Specimen Description BLOOD LEFT HAND  Final   Special Requests   Final    BOTTLES DRAWN  AEROBIC ONLY Blood Culture results may not be optimal due to an inadequate volume of blood received in culture bottles   Culture   Final    NO GROWTH 5 DAYS Performed at [facility], [place] [address], [place], [place] [address]    Report Status 09/15/2021 FINAL  Final  Culture, blood (routine x 2)     Status: None   Collection Time: 09/10/21  4:00 PM   Specimen: BLOOD RIGHT FOREARM  Result Value Ref Range Status   Specimen Description BLOOD RIGHT FOREARM  Final   Special Requests   Final    BOTTLES DRAWN AEROBIC ONLY Blood Culture results may not be optimal due to an inadequate volume of blood received in culture bottles   Culture   Final    NO GROWTH 5 DAYS Performed at [facility], [place] [address] [place], [place] [address]    Report Status 09/15/2021 FINAL  Final  MRSA Next Gen by PCR, Nasal     Status: None   Collection Time: 09/10/21  5:36 PM   Specimen: Nasal Mucosa; Nasal Swab  Result Value Ref Range Status   MRSA by PCR Next Gen NOT DETECTED NOT DETECTED Final    Comment: (NOTE) The GeneXpert MRSA Assay (FDA approved for NASAL specimens only), is one component of a comprehensive MRSA colonization surveillance program. It is not intended to diagnose MRSA infection nor to guide or monitor treatment for MRSA infections. Test performance is not FDA approved in patients less than 16 years old. Performed at [facility], [place] [address], [place], [place] [address]      DG CHEST PORT 1 VIEW  Result Date: 09/15/2021 CLINICAL DATA:  Hypoxia EXAM: PORTABLE CHEST 1 VIEW COMPARISON:  Chest radiograph done on 09/11/2021 CT done on 09/12/2021 FINDINGS: Transverse diameter of heart is increased. Central pulmonary vessels are more prominent. There is interval increase in interstitial and alveolar markings in the parahilar regions and lower lung fields. There is blunting of lateral CP angles suggesting small bilateral effusions. Tip of dialysis catheter is  seen in the right  atrium. Tip of left IJ central venous catheter is seen in the superior vena cava. IMPRESSION: Cardiomegaly. Central pulmonary vessels are more prominent suggesting CHF. Interval increase in interstitial and alveolar markings in both lungs suggest pulmonary edema. Small bilateral pleural effusions. Electronically Signed   By: Elmer Picker M.D.   On: 09/15/2021 13:34     Family Communication: Daughter at bedside 6/11  Disposition: Status is: Inpatient Remains inpatient appropriate because: Remains critically ill Planned Discharge Destination:  TBD  - SNF vs. LTC.  Little Ishikawa, MD 09/17/2021 7:57 AM Page by [email or web address]

## 2021-09-18 ENCOUNTER — Other Ambulatory Visit (HOSPITAL_COMMUNITY): Payer: Self-pay

## 2021-09-18 DIAGNOSIS — D735 Infarction of spleen: Secondary | ICD-10-CM | POA: Diagnosis not present

## 2021-09-18 LAB — CBC
HCT: 29.5 % — ABNORMAL LOW (ref 36.0–46.0)
Hemoglobin: 8.7 g/dL — ABNORMAL LOW (ref 12.0–15.0)
MCH: 28 pg (ref 26.0–34.0)
MCHC: 29.5 g/dL — ABNORMAL LOW (ref 30.0–36.0)
MCV: 94.9 fL (ref 80.0–100.0)
Platelets: 223 10*3/uL (ref 150–400)
RBC: 3.11 MIL/uL — ABNORMAL LOW (ref 3.87–5.11)
RDW: 20 % — ABNORMAL HIGH (ref 11.5–15.5)
WBC: 25.3 10*3/uL — ABNORMAL HIGH (ref 4.0–10.5)
nRBC: 0.2 % (ref 0.0–0.2)

## 2021-09-18 LAB — HEPARIN LEVEL (UNFRACTIONATED): Heparin Unfractionated: 0.4 IU/mL (ref 0.30–0.70)

## 2021-09-18 LAB — GLUCOSE, CAPILLARY: Glucose-Capillary: 116 mg/dL — ABNORMAL HIGH (ref 70–99)

## 2021-09-18 MED ORDER — ASPIRIN 81 MG PO TBEC
81.0000 mg | DELAYED_RELEASE_TABLET | Freq: Every day | ORAL | Status: DC
Start: 2021-09-18 — End: 2021-10-14
  Administered 2021-09-18 – 2021-10-13 (×26): 81 mg via ORAL
  Filled 2021-09-18 (×26): qty 1

## 2021-09-18 MED ORDER — APIXABAN 5 MG PO TABS
5.0000 mg | ORAL_TABLET | Freq: Two times a day (BID) | ORAL | Status: DC
  Administered 2021-09-18 – 2021-09-27 (×19): 5 mg via ORAL
  Filled 2021-09-18 (×19): qty 1

## 2021-09-18 NOTE — Progress Notes (Signed)
ANTICOAGULATION CONSULT NOTE - Follow Up Consult  Pharmacy Consult for Heparin Indication:  Splenic infarction  Allergies  Allergen Reactions   Benzonatate Other (See Comments)    Hallucinations   Hydralazine Other (See Comments)    Hallucinations   Amoxicillin Itching and Nausea And Vomiting   Clonidine Other (See Comments)    Altered mental state, insomnia    Chlorhexidine Itching    Patient reports it is the CHG baths that cause her itching not the CHG used for caring for her IV/HD access.     Morphine Itching   Oxycodone Itching    Pt tolerated hospital admission 07/2021    Patient Measurements: Height: 5\' 5"  (165.1 cm) Weight: (!) 156.1 kg (344 lb 2.2 oz) IBW/kg (Calculated) : 57 Heparin Dosing Weight: 95.1 kg  Vital Signs: Temp: 97.6 F (36.4 C) (06/14 0722) Temp Source: Oral (06/14 0722) BP: 147/81 (06/14 0722) Pulse Rate: 89 (06/14 0722)  Labs: Recent Labs    09/16/21 0520 09/17/21 0530 09/17/21 1300 09/18/21 0500  HGB 8.5* 8.7*  --  8.7*  HCT 28.7* 29.0*  --  29.5*  PLT 203 214  --  223  HEPARINUNFRC 0.39 0.27* 0.30 0.40  CREATININE 6.95*  --   --   --      Estimated Creatinine Clearance: 14.6 mL/min (A) (by C-G formula based on SCr of 6.95 mg/dL (H)).   Medications:  Scheduled:   atorvastatin  10 mg Oral QHS   calcitRIOL  1 mcg Oral Q T,Th,Sa-HD   calcium carbonate  1,500 mg Oral TID with meals   cinacalcet  30 mg Oral Q T,Th,Sa-HD   darbepoetin (ARANESP) injection - DIALYSIS  200 mcg Intravenous Q Thu-HD   diclofenac Sodium  2 g Topical QID   feeding supplement  1 Container Oral TID BM   gabapentin  100 mg Oral Q12H   gabapentin  200 mg Oral Q T,Th,Sa-HD   Gerhardt's butt cream   Topical BID   lactulose  30 g Oral TID   metoCLOPramide (REGLAN) injection  10 mg Intravenous Q6H   multivitamin  1 tablet Oral QHS   pantoprazole  40 mg Oral BID   polyethylene glycol  17 g Oral Daily   senna-docusate  1 tablet Oral BID   sodium chloride flush   10-40 mL Intracatheter Q12H   sodium chloride flush  3 mL Intravenous Q12H   Infusions:   sodium chloride Stopped (09/13/21 0341)   ceFEPime (MAXIPIME) IV Stopped (09/17/21 1149)   heparin 2,900 Units/hr (09/18/21 0237)    Assessment: [name] presented to ED with c/o abdominal pain, constipation, nausea, and vomiting x 1 week. CT abdomen shows splenic infarction. Pt not on anticoagulation PTA. Pharmacy consulted for heparin dosing.   Heparin level today is therapeutic at 0.4, on 2800 units/hr. Hgb 8.7.  Goal of Therapy:  Heparin level 0.3-0.7 units/ml Monitor platelets by anticoagulation protocol: Yes   Plan:  Continue IV heparin at 2800 units/hr. Daily CBC, heparin level.   Hildred Laser, PharmD Clinical Pharmacist **Pharmacist phone directory can now be found on [email or web address] ([email or web address]).  Listed under [place].

## 2021-09-18 NOTE — TOC Benefit Eligibility Note (Signed)
Patient Research scientist (life sciences) completed.     The patient is currently admitted and upon discharge could be taking Eliquis 5 mg.   The current 30 day co-pay is, $0.   The patient is insured through [employer].

## 2021-09-18 NOTE — Progress Notes (Signed)
ANTICOAGULATION CONSULT NOTE - Follow Up Consult  Pharmacy Consult for Heparin> apixaban Indication:  Splenic infarction  Allergies  Allergen Reactions   Benzonatate Other (See Comments)    Hallucinations   Hydralazine Other (See Comments)    Hallucinations   Amoxicillin Itching and Nausea And Vomiting   Clonidine Other (See Comments)    Altered mental state, insomnia    Chlorhexidine Itching    Patient reports it is the CHG baths that cause her itching not the CHG used for caring for her IV/HD access.     Morphine Itching   Oxycodone Itching    Pt tolerated hospital admission 07/2021    Patient Measurements: Height: 5\' 5"  (165.1 cm) Weight: (!) 156.1 kg (344 lb 2.2 oz) IBW/kg (Calculated) : 57 Heparin Dosing Weight: 95.1 kg  Vital Signs: Temp: 97.6 F (36.4 C) (06/14 0722) Temp Source: Oral (06/14 0722) BP: 147/81 (06/14 0722) Pulse Rate: 89 (06/14 0722)  Labs: Recent Labs    09/16/21 0520 09/17/21 0530 09/17/21 1300 09/18/21 0500  HGB 8.5* 8.7*  --  8.7*  HCT 28.7* 29.0*  --  29.5*  PLT 203 214  --  223  HEPARINUNFRC 0.39 0.27* 0.30 0.40  CREATININE 6.95*  --   --   --      Estimated Creatinine Clearance: 14.6 mL/min (A) (by C-G formula based on SCr of 6.95 mg/dL (H)).   Medications:  Scheduled:   apixaban  5 mg Oral BID   aspirin EC  81 mg Oral Daily   atorvastatin  10 mg Oral QHS   calcitRIOL  1 mcg Oral Q T,Th,Sa-HD   calcium carbonate  1,500 mg Oral TID with meals   cinacalcet  30 mg Oral Q T,Th,Sa-HD   darbepoetin (ARANESP) injection - DIALYSIS  200 mcg Intravenous Q Thu-HD   diclofenac Sodium  2 g Topical QID   feeding supplement  1 Container Oral TID BM   gabapentin  100 mg Oral Q12H   gabapentin  200 mg Oral Q T,Th,Sa-HD   Gerhardt's butt cream   Topical BID   lactulose  30 g Oral TID   metoCLOPramide (REGLAN) injection  10 mg Intravenous Q6H   multivitamin  1 tablet Oral QHS   pantoprazole  40 mg Oral BID   polyethylene glycol  17 g Oral  Daily   senna-docusate  1 tablet Oral BID   sodium chloride flush  10-40 mL Intracatheter Q12H   sodium chloride flush  3 mL Intravenous Q12H   Infusions:   sodium chloride Stopped (09/13/21 0341)   ceFEPime (MAXIPIME) IV Stopped (09/17/21 1149)    Assessment: [name] presented to ED with c/o abdominal pain, constipation, nausea, and vomiting x 1 week. She is noted with recent R AKA. CT abdomen shows splenic infarction. Pt not on anticoagulation PTA. Pharmacy consulted for heparin dosing. No further plans for procedures and plans are to change to apixaban. -hg- 8.7  Since heparin started on 09/10/21 (> 7days) will skip the 7 day apixaban load (Discussed w/ Dr. Avon Gully) -cost of apixaban is $0 (covered by insurance)  Goal of Therapy:  Heparin level 0.3-0.7 units/ml Monitor platelets by anticoagulation protocol: Yes   Plan:  -apixban 5mg  po bid -Will follow patient progress   Hildred Laser, PharmD Clinical Pharmacist **Pharmacist phone directory can now be found on [email or web address] ([email or web address]).  Listed under [place].

## 2021-09-18 NOTE — Progress Notes (Signed)
[facility] Progress Note   Subjective:   Reports appetite is a little bit better. Reports SOB is mild and unchanged. Denies CP, dizziness, abdominal pain and nausea  Objective Vitals:   09/17/21 1330 09/17/21 2130 09/18/21 0452 09/18/21 0722  BP: (!) 148/52 140/70 (!) 150/87 (!) 147/81  Pulse:  100 94 89  Resp: 19 20 14 13   Temp: 99 F (37.2 C) 98.5 F (36.9 C) 98.7 F (37.1 C) 97.6 F (36.4 C)  TempSrc: Oral Oral Oral Oral  SpO2:  100% 100% 100%  Weight:      Height:       Physical Exam General: Alert female in NAD Heart: RRR, no murmurs, rubs or gallops Lungs: Diminished breath sounds due to body habitus. No wheezing, rhonchi or rales auscultated Abdomen: Soft, non-distended, +BS Extremities: No edema b/l lower extremities Dialysis Access: R [facility]   Additional Objective Labs: Basic Metabolic Panel: Recent Labs  Lab 09/12/21 2050 09/13/21 0751 09/14/21 0636 09/16/21 0520  NA  --  130* 130* 129*  K  --  3.5 4.6 4.0  CL  --  94* 93* 91*  CO2  --  26 21* 25  GLUCOSE  --  104* 92 89  BUN  --  40* 43* 31*  CREATININE  --  7.85* 8.16* 6.95*  CALCIUM  --  7.5* 5.9* 8.2*  PHOS 5.9* 3.6 4.1  --    Liver Function Tests: Recent Labs  Lab 09/14/21 0636  AST 16  ALT 10  ALKPHOS 124  BILITOT 0.7  PROT 6.0*  ALBUMIN 1.9*  1.9*   No results for input(s): "LIPASE", "AMYLASE" in the last 168 hours. CBC: Recent Labs  Lab 09/14/21 0636 09/15/21 0608 09/16/21 0520 09/17/21 0530 09/18/21 0500  WBC 21.2* 22.4* 28.9* 29.9* 25.3*  NEUTROABS 17.0*  --   --   --   --   HGB 8.1* 8.7* 8.5* 8.7* 8.7*  HCT 26.2* 29.8* 28.7* 29.0* 29.5*  MCV 91.3 92.0 92.9 92.9 94.9  PLT 190 206 203 214 223   Blood Culture    Component Value Date/Time   SDES BLOOD RIGHT FOREARM 09/10/2021 1600   SPECREQUEST  09/10/2021 1600    BOTTLES DRAWN AEROBIC ONLY Blood Culture results may not be optimal due to an inadequate volume of blood received in culture bottles   CULT   09/10/2021 1600    NO GROWTH 5 DAYS Performed at [facility], [place] [address] [place], [place] [address]    REPTSTATUS 09/15/2021 FINAL 09/10/2021 1600    Cardiac Enzymes: No results for input(s): "CKTOTAL", "CKMB", "CKMBINDEX", "TROPONINI" in the last 168 hours. CBG: Recent Labs  Lab 09/16/21 2117 09/17/21 1216 09/17/21 1816 09/17/21 2129 09/18/21 0735  GLUCAP 96 75 109* 252* 116*   Iron Studies: No results for input(s): "IRON", "TIBC", "TRANSFERRIN", "FERRITIN" in the last 72 hours. @lablastinr3 @ Studies/Results: No results found. Medications:  sodium chloride Stopped (09/13/21 0341)   ceFEPime (MAXIPIME) IV Stopped (09/17/21 1149)   heparin 2,900 Units/hr (09/18/21 0237)    atorvastatin  10 mg Oral QHS   calcitRIOL  1 mcg Oral Q T,Th,Sa-HD   calcium carbonate  1,500 mg Oral TID with meals   cinacalcet  30 mg Oral Q T,Th,Sa-HD   darbepoetin (ARANESP) injection - DIALYSIS  200 mcg Intravenous Q Thu-HD   diclofenac Sodium  2 g Topical QID   feeding supplement  1 Container Oral TID BM   gabapentin  100 mg Oral Q12H   gabapentin  200 mg Oral Q T,Th,Sa-HD   Gerhardt's butt cream   Topical BID   lactulose  30 g Oral TID   metoCLOPramide (REGLAN) injection  10 mg Intravenous Q6H   multivitamin  1 tablet Oral QHS   pantoprazole  40 mg Oral BID   polyethylene glycol  17 g Oral Daily   senna-docusate  1 tablet Oral BID   sodium chloride flush  10-40 mL Intracatheter Q12H   sodium chloride flush  3 mL Intravenous Q12H    Dialysis Orders: [name] T[facility] 4h 28min  151 kg  1K/2.5Ca bath  TDC  400 BFR - 4/29 > HBsAg neg w/ Abs < 10 Hep 2,600 load and 1,500 hourly  - mircera 200ug q2 due 09/10/21 - calcitriol 1 ug tiw - sensipar 30 mg tiw  Assessment/Plan: ESRD: On HD TTS. CXR 09/15/21 with evidence of volume overload. Weights here are variable but with very minimal PO intake, has likely lost some weight. Tolerated  3L UF with HD yesterday.  Continue Midodrine with  HD. 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 Anemia of ESRD /acute illness-Hgb 8.7. Continue Aranesp 200 mcg weekly Metabolic bone disease -PO4 now at goal. Recheck RFP with HD tomorrow. Plan to start binders once patient can tolerate solids. PAD status post right AKA/ LLE PAD: VVS consulted. Plan for staple removal in outpatient -VVS to see here if she is still inpatient. 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 Continue pain management Chronic hypoxic respiratory failure: Managed by primary Diabetes mellitus type 2= per primary     Anice Paganini, PA-C 09/18/2021, 10:12 AM  [facility] Pager: [phone]

## 2021-09-18 NOTE — Care Management Important Message (Signed)
Important Message  Patient Details  Name: Courtney Gray MRN: [id number] Date of Birth: 03-16-1971   Medicare Important Message Given:  Yes     Shelda Altes 09/18/2021, 7:47 AM

## 2021-09-18 NOTE — Progress Notes (Signed)
Patient declined the used of CPAP for the night. Patient wearing oxygen set at 3lpm with Sp02=99%. Will continue to monitor patient.

## 2021-09-18 NOTE — Progress Notes (Signed)
Pt refused CPAP for tonight.  

## 2021-09-18 NOTE — TOC Progression Note (Addendum)
Transition of Care [facility]) - Progression Note    Patient Details  Name: Courtney Gray MRN: [id number] Date of Birth: Jan 14, 1971  Transition of Care [facility]) CM/SW [place], [place] Phone Number: 09/18/2021, 8:36 AM  Clinical Narrative:     CSW followed up with remaining pending facilities.No current LTC SNF bed offers for patient. Barriers to placement are age,and bariatric bed. CSW informed patients daughter. CSW notified Olga Coaster with [facility] leadership of barriers to placement for patient. Hassan Rowan is going to send referral to see if patient can get [facility]. Patients daughter updated.MD updated. CSW will continue to follow.  Expected Discharge Plan: Skilled Nursing Facility Barriers to Discharge: Continued Medical Work up  Expected Discharge Plan and Services Expected Discharge Plan: [place] In-house Referral: Clinical Social Work Discharge Planning Services: CM Consult Post Acute Care Choice: Durable Medical Equipment Living arrangements for the past 2 months: Single Family Home                                       Social Determinants of Health (SDOH) Interventions    Readmission Risk Interventions    09/16/2021    4:36 PM 08/28/2021   11:50 AM 07/12/2021   12:38 PM  Readmission Risk Prevention Plan  Transportation Screening Complete Complete Complete  Medication Review Press photographer)  Complete Complete  PCP or Specialist appointment within 3-5 days of discharge  Complete Complete  HRI or Home Care Consult Complete Complete Complete  SW Recovery Care/Counseling Consult Complete Complete Complete  Palliative Care Screening  Not Applicable Not Applicable  Skilled Nursing Facility Complete Not Applicable Complete

## 2021-09-18 NOTE — Progress Notes (Signed)
Nutrition Follow-up  DOCUMENTATION CODES:  Not applicable  INTERVENTION:  - Boost Breeze po TID, each supplement provides 250 kcal and 9 grams of protein - Continue Regular diet with 1200 ml fluid restriction to promote PO intake - Renal MVI daily  NUTRITION DIAGNOSIS:  Inadequate oral intake related to nausea as evidenced by per patient/family report. Ongoing  GOAL:  Patient will meet greater than or equal to 90% of their needs Unmet  MONITOR:  PO intake, Supplement acceptance, Labs, Weight trends, Skin, I & O's  REASON FOR ASSESSMENT:  Consult Enteral/tube feeding initiation and management (advance tube feeds to goal)  ASSESSMENT:  51 year old female who presented to the ED on 6/06 with abdominal pain, N/V, constipation. PMH of ESRD on HD, gangrene of RLE s/p recent R AKA, CHF, CAD, T2DM, chronic hypoxic respiratory failure on home oxygen, HLD, HTN, gout, severe PAD. Pt admitted with splenic infarction, PNA of LLL, shock.  06/06 - NG tube placed 06/08 - NG tube removed during CT scan, replaced by nursing 06/09 - NG tube removed, Cortrak placement attempted but unsuccessful due to pt refusal  Pt resting in bed at the time of visit eating lunch. Daughter at bedside. Pt reports that appetite is improving but is not to baseline. Noted a boost breeze drink at bedside unopened. Inquired if pt was tolerating. States she had not been drinking since she was starting to eat more. Discussed the importance of getting enough protein and nutrition to recover. Encouraged her to use them to take her medicine and as a drink at meals to supplement the amount she was not consuming. Will dc once appetite is to baseline.   EDW: 151 kg Admit weight: 150.6 kg Current weight: 156.1 kg Net IO Since Admission: 14,223.45 mL [09/18/21 1132]  Average Meal Intake: 6/13: 25% intake x 1 recorded meals  Nutritionally Relevant Medications: Scheduled Meds:  atorvastatin  10 mg Oral QHS   calcitRIOL  1  mcg Oral Q T,Th,Sa-HD   calcium carbonate  1,500 mg Oral TID with meals   cinacalcet  30 mg Oral Q T,Th,Sa-HD   feeding supplement  1 Container Oral TID BM   lactulose  30 g Oral TID   metoCLOPramide  10 mg Intravenous Q6H   multivitamin  1 tablet Oral QHS   pantoprazole  40 mg Oral BID   polyethylene glycol  17 g Oral Daily   senna-docusate  1 tablet Oral BID   PRN Meds: calcium carbonate, ondansetron, prochlorperazine  Labs Reviewed: No labs drawn since 6/12  NUTRITION - FOCUSED PHYSICAL EXAM: Flowsheet Row Most Recent Value  Orbital Region No depletion  Upper Arm Region No depletion  Thoracic and Lumbar Region No depletion  Buccal Region No depletion  Temple Region No depletion  Clavicle Bone Region No depletion  Clavicle and Acromion Bone Region No depletion  Scapular Bone Region No depletion  Dorsal Hand No depletion  Patellar Region No depletion  Anterior Thigh Region No depletion  Posterior Calf Region No depletion  Edema (RD Assessment) Moderate  [BUE, BLE]  Hair Reviewed  Eyes Reviewed  Mouth Reviewed  Skin Reviewed  Nails Reviewed   Diet Order:   Diet Order             Diet regular Room service appropriate? Yes; Fluid consistency: Thin; Fluid restriction: 1200 mL Fluid  Diet effective now                   EDUCATION NEEDS:  Education needs have been  addressed  Skin:  Skin Assessment: Skin Integrity Issues: Stage II: bilateral buttocks Incisions: RLE s/p AKA Other: wound to L hand, finger  Last BM:  09/12/21 - bowel regimen in place  Height:  Ht Readings from Last 1 Encounters:  09/10/21 5\' 5"  (1.651 m)    Weight:  Wt Readings from Last 1 Encounters:  09/17/21 (!) 156.1 kg    Ideal Body Weight:  52.3 kg (adjusted for R AKA)  BMI:  Body mass index is 57.27 kg/m.  Estimated Nutritional Needs:  Kcal:  1800-2000 Protein:  90-110 grams Fluid:  1000 ml + UOP   Ranell Patrick, RD, LDN Clinical Dietitian RD pager # available in [facility]   After hours/weekend pager # available in [facility]

## 2021-09-18 NOTE — Progress Notes (Signed)
Progress Note  Patient: Courtney Gray MEB:[id number] DOB: [birth year]  DOA: 09/09/2021  DOS: 09/18/2021    Brief hospital course: Courtney Gray is a 51 y.o. 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 She was hypoglycemic, febrile with CT revealing hypoenhancement in the spleen consistent with infarct.   Assessment and Plan:  Distributive shock with lactic acidosis and presumed acute infection:  - Pt has gangrene of left third digit and splenic infarcts which are the likely culprit for presentation. No other definite nidus of infection currently noted.  - Continuing empiric abx (Cefepime with dialysis) for now, WBC remains grossly elevated. Will de-escalate abx - DC vancomycin 6/13 and follow clinically - continue cefepime. - Weaned from pressors 6/8. Was on midodrine but now becoming hypertensive, will DC midodrine. - Monitor blood cultures (NGTD x4 days)   IDT2DM uncontrolled with hypoglycemia:  - HbA1c 5.7% and recurrently hypoglycemic here requiring D10 at one point (now off). - Encourage po intake. D/w pt and daughter. We removed NGT w/trickle feeds with plans for cor trak which the patient has declined - Hold insulin and follow clinically. Glucagon prn - May need to restart sliding scale in near future pending labs  Splenic infarct:  - No embolic source noted on echo or CT's - Unclear if possibly infectious vs embolic given above - Heparin gtt- transition to eliquis 5 BID - Pain control with oral dilaudid.   Severe PAD s/p right AKA:  - Significant vascular compromise of LLE as well.  - VVS consulted, planning outpatient suture removal 6/13, will see here if still admitted. Recommended monitoring and protected LLE as this would be at high risk for amputation if  wounded. - Continue statin, anticoagulation. Can restart ASA once more stable.  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  - Hand surgery consulted, recommending outpatient follow up to allow for evolutionary changes prior to amputation.  - CT chest showed no embolizing proximal aortic lesions. - Vascular surgery stated she has multiphasic flow per recent duplex - Pain control: Gabapentin, tylenol, dilaudid  Acute on chronic hypoxic respiratory failure, suspected OHS, OSA:  - Continue supplemental O2 to maintain SpO2 90-95% to avoid risk of hypercarbia. Continue BiPAP qHS. Became more short of breath over past 24 hours. Will DC IVF, give breathing Tx prn, though no new adventitious lung sounds, exam is limited by habitus. Chest CXR. On abx and anticoagulation as above already. - Atelectasis and acute on chronic hypoventilation strongly suspected to be contributing. Needs incentive spirometry.  - Bronchodilator prn, no wheezing currently  GERD:  - Augmented PPI to BID, added carafate  Nausea:  - Continue zofran prn and compazine prn refractory symptoms.  ESRD:  - Back on TTS scheduled for HD per nephrology.    Poor IV access: s/p left EJ triple lumen catheter placed by IR 6/7. This is still needed at this time.  Morbid obesity: Body mass index is 57.27 kg/m.   HTN: Has been requiring midodrine.  - May need to reinitiate antihypertensive Tx (norvasc, coreg) if BPs rise after stopping midodrine.  Demand myocardial ischemia: Mild troponin elevation without angina.  - Defer further work up at this time.   Anemia of ESRD:  - Defer to nephrology whether ESA should be held. No active bleeding,  will monitor hgb on anticoagulation.  HLD:  - Continue statin  Constipation: LBM 6/8 - Continue regular bowel regimen.   Stage 2 left and buttocks pressure injuries, POA:  - Offload as able.   Needs to start PT/OT, ordered. TOC consulted for LTC vs. SNF  placement  Subjective: No acute issues or events overnight  Objective: Vitals:   09/17/21 1330 09/17/21 2130 09/18/21 0452 09/18/21 0722  BP: (!) 148/52 140/70 (!) 150/87 (!) 147/81  Pulse:  100 94 89  Resp: 19 20 14 13   Temp: 99 F (37.2 C) 98.5 F (36.9 C) 98.7 F (37.1 C) 97.6 F (36.4 C)  TempSrc: Oral Oral Oral Oral  SpO2:  100% 100% 100%  Weight:      Height:       Gen: 52 y.o. female in no distress Pulm: Nonlabored at rest without wheezes or crackles anterolaterally. CV: Regular rate and rhythm. Soft systolic murmur at base without any new murmur, rub, or gallop. UTD JVD, no pitting dependent edema. GI: Abdomen soft, not significantly tender, non-distended, with normoactive bowel sounds.  Ext: Warm, no deformities Skin: No new rashes, lesions or ulcers on visualized skin. Left 3rd finger with foam pad dressing c/d/I without spreading erythema or necrosis or odor at this time. Neuro: Alert and oriented. No focal neurological deficits. Psych: Judgement and insight appear fair. Mood euthymic & affect congruent. Behavior is appropriate.    Data Personally reviewed: CBC: Recent Labs  Lab 09/14/21 0636 09/15/21 0608 09/16/21 0520 09/17/21 0530 09/18/21 0500  WBC 21.2* 22.4* 28.9* 29.9* 25.3*  NEUTROABS 17.0*  --   --   --   --   HGB 8.1* 8.7* 8.5* 8.7* 8.7*  HCT 26.2* 29.8* 28.7* 29.0* 29.5*  MCV 91.3 92.0 92.9 92.9 94.9  PLT 190 206 203 214 865    Basic Metabolic Panel: Recent Labs  Lab 09/12/21 0225 09/12/21 2050 09/13/21 0751 09/14/21 0636 09/16/21 0520  NA 130*  --  130* 130* 129*  K 3.9  --  3.5 4.6 4.0  CL 92*  --  94* 93* 91*  CO2 22  --  26 21* 25  GLUCOSE 74  --  104* 92 89  BUN 69*  --  40* 43* 31*  CREATININE 11.84*  --  7.85* 8.16* 6.95*  CALCIUM 7.7*  --  7.5* 5.9* 8.2*  MG  --  1.9 1.6*  --   --   PHOS  --  5.9* 3.6 4.1  --     GFR: Estimated Creatinine Clearance: 14.6 mL/min (A) (by C-G formula based on SCr of 6.95 mg/dL (H)). Liver  Function Tests: Recent Labs  Lab 09/14/21 0636  AST 16  ALT 10  ALKPHOS 124  BILITOT 0.7  PROT 6.0*  ALBUMIN 1.9*  1.9*    No results for input(s): "LIPASE", "AMYLASE" in the last 168 hours.  No results for input(s): "AMMONIA" in the last 168 hours. Coagulation Profile: No results for input(s): "INR", "PROTIME" in the last 168 hours.  Cardiac Enzymes: No results for input(s): "CKTOTAL", "CKMB", "CKMBINDEX", "TROPONINI" in the last 168 hours. BNP (last 3 results) No results for input(s): "PROBNP" in the last 8760 hours. HbA1C: No results for input(s): "HGBA1C" in the last 72 hours. CBG: Recent Labs  Lab 09/16/21 2117 09/17/21 1216 09/17/21 1816 09/17/21 2129 09/18/21 0735  GLUCAP 96 75 109* 252* 116*    Lipid Profile: No results for input(s): "CHOL", "HDL", "LDLCALC", "TRIG", "CHOLHDL", "LDLDIRECT" in the last 72 hours. Thyroid  Function Tests: No results for input(s): "TSH", "T4TOTAL", "FREET4", "T3FREE", "THYROIDAB" in the last 72 hours. Anemia Panel: No results for input(s): "VITAMINB12", "FOLATE", "FERRITIN", "TIBC", "IRON", "RETICCTPCT" in the last 72 hours. Urine analysis: No results found for: "COLORURINE", "APPEARANCEUR", "LABSPEC", "PHURINE", "GLUCOSEU", "HGBUR", "BILIRUBINUR", "KETONESUR", "PROTEINUR", "UROBILINOGEN", "NITRITE", "LEUKOCYTESUR" Recent Results (from the past 240 hour(s))  Culture, blood (routine x 2)     Status: None   Collection Time: 09/10/21  5:15 AM   Specimen: BLOOD LEFT HAND  Result Value Ref Range Status   Specimen Description BLOOD LEFT HAND  Final   Special Requests   Final    BOTTLES DRAWN AEROBIC ONLY Blood Culture results may not be optimal due to an inadequate volume of blood received in culture bottles   Culture   Final    NO GROWTH 5 DAYS Performed at [facility], [place] [address], [place], [place] [address]    Report Status 09/15/2021 FINAL  Final  Culture, blood (routine x 2)     Status: None   Collection Time:  09/10/21  4:00 PM   Specimen: BLOOD RIGHT FOREARM  Result Value Ref Range Status   Specimen Description BLOOD RIGHT FOREARM  Final   Special Requests   Final    BOTTLES DRAWN AEROBIC ONLY Blood Culture results may not be optimal due to an inadequate volume of blood received in culture bottles   Culture   Final    NO GROWTH 5 DAYS Performed at [facility], [place] [address] [place], [place] [address]    Report Status 09/15/2021 FINAL  Final  MRSA Next Gen by PCR, Nasal     Status: None   Collection Time: 09/10/21  5:36 PM   Specimen: Nasal Mucosa; Nasal Swab  Result Value Ref Range Status   MRSA by PCR Next Gen NOT DETECTED NOT DETECTED Final    Comment: (NOTE) The GeneXpert MRSA Assay (FDA approved for NASAL specimens only), is one component of a comprehensive MRSA colonization surveillance program. It is not intended to diagnose MRSA infection nor to guide or monitor treatment for MRSA infections. Test performance is not FDA approved in patients less than 24 years old. Performed at [facility], [place] [address] [place], [place] [address]      No results found.   Family Communication: Daughter at bedside 6/11  Disposition: Status is: Inpatient Remains inpatient appropriate because: Remains critically ill Planned Discharge Destination:  TBD  - SNF vs. LTC.  Little Ishikawa, MD 09/18/2021 7:51 AM Page by [email or web address]

## 2021-09-19 DIAGNOSIS — D735 Infarction of spleen: Secondary | ICD-10-CM | POA: Diagnosis not present

## 2021-09-19 LAB — RENAL FUNCTION PANEL
Albumin: 2 g/dL — ABNORMAL LOW (ref 3.5–5.0)
Anion gap: 13 (ref 5–15)
BUN: 38 mg/dL — ABNORMAL HIGH (ref 6–20)
CO2: 26 mmol/L (ref 22–32)
Calcium: 8.2 mg/dL — ABNORMAL LOW (ref 8.9–10.3)
Chloride: 93 mmol/L — ABNORMAL LOW (ref 98–111)
Creatinine, Ser: 7.34 mg/dL — ABNORMAL HIGH (ref 0.44–1.00)
GFR, Estimated: 6 mL/min — ABNORMAL LOW (ref >60)
Glucose, Bld: 88 mg/dL (ref 70–99)
Phosphorus: 4.8 mg/dL — ABNORMAL HIGH (ref 2.5–4.6)
Potassium: 4.5 mmol/L (ref 3.5–5.1)
Sodium: 132 mmol/L — ABNORMAL LOW (ref 135–145)

## 2021-09-19 LAB — CBC
HCT: 29.1 % — ABNORMAL LOW (ref 36.0–46.0)
Hemoglobin: 8.4 g/dL — ABNORMAL LOW (ref 12.0–15.0)
MCH: 27.5 pg (ref 26.0–34.0)
MCHC: 28.9 g/dL — ABNORMAL LOW (ref 30.0–36.0)
MCV: 95.4 fL (ref 80.0–100.0)
Platelets: 234 10*3/uL (ref 150–400)
RBC: 3.05 MIL/uL — ABNORMAL LOW (ref 3.87–5.11)
RDW: 20.2 % — ABNORMAL HIGH (ref 11.5–15.5)
WBC: 23.6 10*3/uL — ABNORMAL HIGH (ref 4.0–10.5)
nRBC: 0.2 % (ref 0.0–0.2)

## 2021-09-19 LAB — GLUCOSE, CAPILLARY: Glucose-Capillary: 80 mg/dL (ref 70–99)

## 2021-09-19 LAB — PROCALCITONIN: Procalcitonin: 8.44 ng/mL

## 2021-09-19 MED ORDER — ALTEPLASE 2 MG IJ SOLR: 2.0000 mg | Freq: Once | INTRAMUSCULAR | Status: DC | PRN

## 2021-09-19 MED ORDER — LIDOCAINE-PRILOCAINE 2.5-2.5 % EX CREA: 1.0000 "application " | TOPICAL_CREAM | CUTANEOUS | Status: DC | PRN

## 2021-09-19 MED ORDER — ANTICOAGULANT SODIUM CITRATE 4% (200MG/5ML) IV SOLN: 5.0000 mL | Status: DC | PRN

## 2021-09-19 MED ORDER — PENTAFLUOROPROP-TETRAFLUOROETH EX AERO: 1.0000 "application " | INHALATION_SPRAY | CUTANEOUS | Status: DC | PRN

## 2021-09-19 MED ORDER — LIDOCAINE HCL (PF) 1 % IJ SOLN: 5.0000 mL | INTRAMUSCULAR | Status: DC | PRN

## 2021-09-19 MED ORDER — HEPARIN SODIUM (PORCINE) 1000 UNIT/ML IJ SOLN
INTRAMUSCULAR | Status: AC
  Filled 2021-09-19: qty 2

## 2021-09-19 MED ORDER — HEPARIN SODIUM (PORCINE) 1000 UNIT/ML DIALYSIS
1000.0000 [IU] | INTRAMUSCULAR | Status: DC | PRN
  Administered 2021-09-19: 4000 [IU]
  Filled 2021-09-19 (×2): qty 1

## 2021-09-19 NOTE — Progress Notes (Signed)
Progress Note  Patient: Courtney Gray SWN:[id number] DOB: 05/03/1970  DOA: 09/09/2021  DOS: 09/19/2021    Brief hospital course: Courtney Gray is a 51 y.o. 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 She was hypoglycemic, febrile with CT revealing hypoenhancement in the spleen consistent with infarct.   Assessment and Plan:   Distributive shock with lactic acidosis and presumed acute infection:  - Pt has gangrene of left third digit and splenic infarcts which are the likely culprit for presentation. No other definite nidus of infection currently noted.  - Continuing empiric abx (Cefepime with dialysis) for now, WBC remains grossly elevated. Will de-escalate abx - DC vancomycin 6/13 and follow clinically - continue cefepime IV while inpatient, de-escalate to p.o. antibiotics at discharge. -Procalcitonin remains markedly elevated as this patient's white count -see below for further discussion on patient's necrotic finger - Weaned from pressors 6/8. Was on midodrine but now becoming hypertensive, will DC midodrine. - Monitor blood cultures (NGTD x4 days)   IDT2DM uncontrolled with hypoglycemia:  - HbA1c 5.7% and recurrently hypoglycemic here requiring D10 at one point (now off). - Encourage po intake. D/w pt and daughter. We removed NGT w/trickle feeds with plans for cor trak which the patient has declined - Hold insulin and follow clinically. Glucagon prn - May need to restart sliding scale in near future pending labs  Splenic infarct:  - No embolic source noted on echo or CT's - Unclear if possibly infectious vs embolic given above - Heparin gtt- transition to eliquis 5 BID - Pain control with oral dilaudid.   Severe PAD s/p right AKA:  - Significant vascular compromise of  LLE as well.  - VVS consulted, planning outpatient suture removal 6/13, will see here if still admitted. Recommended monitoring and protected LLE as this would be at high risk for amputation if wounded. - Continue statin, anticoagulation. Can restart ASA once more stable.  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  - Hand surgery consulted, recommending outpatient follow up to allow for evolutionary changes prior to amputation.  - Discussed with Ortho to have reevaluation given her prolonged hospital stay to reevaluate for possible amputation here versus outpatient, appreciate insight recommendations. - Vascular surgery stated she has multiphasic flow per recent duplex - Pain control: Gabapentin, tylenol, dilaudid  Acute on chronic hypoxic respiratory failure, suspected OHS, OSA, resolved:  - Continue supplemental O2 to maintain SpO2 90-95% to avoid risk of hypercarbia. Continue BiPAP qHS. On abx and anticoagulation as above already. - Atelectasis and acute on chronic hypoventilation strongly suspected to be contributing.  Continue physical therapy, early ambulation and incentive spirometer - Bronchodilator prn, no wheezing currently -Chronically on 2 L nasal cannula, back to baseline at this point  GERD:  - Augmented PPI to BID, added carafate  Nausea:  - Continue zofran prn and compazine prn refractory symptoms.  ESRD:  - Back on TTS scheduled for HD per nephrology.    Poor IV access: s/p left EJ triple lumen catheter placed by IR 6/7. This is still needed at this time.  Morbid obesity: Body mass index is 57.27 kg/m.   HTN: Has been requiring midodrine.  - May need to reinitiate antihypertensive Tx (norvasc, coreg) if BPs rise after stopping midodrine.  Demand myocardial  ischemia: Mild troponin elevation without angina.  - Defer further work up at this time.   Anemia of ESRD:  - Defer to nephrology whether ESA should be held. No active bleeding, will  monitor hgb on anticoagulation.  HLD:  - Continue statin  Constipation: LBM 6/8 - Continue regular bowel regimen.   Stage 2 left and buttocks pressure injuries, POA:  - Offload as able.   Needs to start PT/OT, ordered. TOC consulted for LTC vs. SNF placement  Subjective: No acute issues or events overnight  Objective: Vitals:   09/18/21 0722 09/18/21 1534 09/18/21 2030 09/19/21 0418  BP: (!) 147/81 (!) 141/74 (!) 151/69 (!) 147/77  Pulse: 89 100 99 93  Resp: 13 (!) 21 (!) 23 16  Temp: 97.6 F (36.4 C) 99.1 F (37.3 C) 99.1 F (37.3 C) 98.8 F (37.1 C)  TempSrc: Oral Oral Oral Oral  SpO2: 100% 100% 99% 100%  Weight:      Height:       Gen: 51 y.o. female in no distress Pulm: Nonlabored at rest without wheezes or crackles anterolaterally. CV: Regular rate and rhythm. Soft systolic murmur at base without any new murmur, rub, or gallop. UTD JVD, no pitting dependent edema. GI: Abdomen soft, not significantly tender, non-distended, with normoactive bowel sounds.  Ext: Warm, no deformities Skin: No new rashes, lesions or ulcers on visualized skin. Left 3rd finger with foam pad dressing c/d/I without spreading erythema or necrosis or odor at this time. Neuro: Alert and oriented. No focal neurological deficits. Psych: Judgement and insight appear fair. Mood euthymic & affect congruent. Behavior is appropriate.    Data Personally reviewed: CBC: Recent Labs  Lab 09/14/21 0636 09/15/21 0608 09/16/21 0520 09/17/21 0530 09/18/21 0500  WBC 21.2* 22.4* 28.9* 29.9* 25.3*  NEUTROABS 17.0*  --   --   --   --   HGB 8.1* 8.7* 8.5* 8.7* 8.7*  HCT 26.2* 29.8* 28.7* 29.0* 29.5*  MCV 91.3 92.0 92.9 92.9 94.9  PLT 190 206 203 214 824    Basic Metabolic Panel: Recent Labs  Lab 09/12/21 2050 09/13/21 0751 09/14/21 0636 09/16/21 0520  NA  --  130* 130* 129*  K  --  3.5 4.6 4.0  CL  --  94* 93* 91*  CO2  --  26 21* 25  GLUCOSE  --  104* 92 89  BUN  --  40* 43* 31*  CREATININE   --  7.85* 8.16* 6.95*  CALCIUM  --  7.5* 5.9* 8.2*  MG 1.9 1.6*  --   --   PHOS 5.9* 3.6 4.1  --     GFR: Estimated Creatinine Clearance: 14.6 mL/min (A) (by C-G formula based on SCr of 6.95 mg/dL (H)). Liver Function Tests: Recent Labs  Lab 09/14/21 0636  AST 16  ALT 10  ALKPHOS 124  BILITOT 0.7  PROT 6.0*  ALBUMIN 1.9*  1.9*    No results for input(s): "LIPASE", "AMYLASE" in the last 168 hours.  No results for input(s): "AMMONIA" in the last 168 hours. Coagulation Profile: No results for input(s): "INR", "PROTIME" in the last 168 hours.  Cardiac Enzymes: No results for input(s): "CKTOTAL", "CKMB", "CKMBINDEX", "TROPONINI" in the last 168 hours. BNP (last 3 results) No results for input(s): "PROBNP" in the last 8760 hours. HbA1C: No results for input(s): "HGBA1C" in the last 72 hours. CBG: Recent Labs  Lab 09/16/21 2117 09/17/21 1216 09/17/21 1816 09/17/21 2129 09/18/21 0735  GLUCAP 96 75 109* 252* 116*  Lipid Profile: No results for input(s): "CHOL", "HDL", "LDLCALC", "TRIG", "CHOLHDL", "LDLDIRECT" in the last 72 hours. Thyroid Function Tests: No results for input(s): "TSH", "T4TOTAL", "FREET4", "T3FREE", "THYROIDAB" in the last 72 hours. Anemia Panel: No results for input(s): "VITAMINB12", "FOLATE", "FERRITIN", "TIBC", "IRON", "RETICCTPCT" in the last 72 hours. Urine analysis: No results found for: "COLORURINE", "APPEARANCEUR", "LABSPEC", "PHURINE", "GLUCOSEU", "HGBUR", "BILIRUBINUR", "KETONESUR", "PROTEINUR", "UROBILINOGEN", "NITRITE", "LEUKOCYTESUR" Recent Results (from the past 240 hour(s))  Culture, blood (routine x 2)     Status: None   Collection Time: 09/10/21  5:15 AM   Specimen: BLOOD LEFT HAND  Result Value Ref Range Status   Specimen Description BLOOD LEFT HAND  Final   Special Requests   Final    BOTTLES DRAWN AEROBIC ONLY Blood Culture results may not be optimal due to an inadequate volume of blood received in culture bottles   Culture    Final    NO GROWTH 5 DAYS Performed at [facility], [place] [address] [place], [place] [address]    Report Status 09/15/2021 FINAL  Final  Culture, blood (routine x 2)     Status: None   Collection Time: 09/10/21  4:00 PM   Specimen: BLOOD RIGHT FOREARM  Result Value Ref Range Status   Specimen Description BLOOD RIGHT FOREARM  Final   Special Requests   Final    BOTTLES DRAWN AEROBIC ONLY Blood Culture results may not be optimal due to an inadequate volume of blood received in culture bottles   Culture   Final    NO GROWTH 5 DAYS Performed at [facility], [place] [address], [place], [place] [address]    Report Status 09/15/2021 FINAL  Final  MRSA Next Gen by PCR, Nasal     Status: None   Collection Time: 09/10/21  5:36 PM   Specimen: Nasal Mucosa; Nasal Swab  Result Value Ref Range Status   MRSA by PCR Next Gen NOT DETECTED NOT DETECTED Final    Comment: (NOTE) The GeneXpert MRSA Assay (FDA approved for NASAL specimens only), is one component of a comprehensive MRSA colonization surveillance program. It is not intended to diagnose MRSA infection nor to guide or monitor treatment for MRSA infections. Test performance is not FDA approved in patients less than 2 years old. Performed at [facility], [place] [address], [place], [place] [address]      No results found.   Family Communication: Daughter at bedside 6/11  Disposition: Status is: Inpatient Remains inpatient appropriate because: Remains critically ill Planned Discharge Destination:  TBD  - SNF vs. LTC.  Little Ishikawa, MD 09/19/2021 7:23 AM Page by [email or web address]

## 2021-09-19 NOTE — Progress Notes (Signed)
Courtney Gray Progress Note   Subjective:   Pt seen in room. Sleeping this AM, wakes up briefly to voice then goes back to sleep.   Objective Vitals:   09/18/21 1534 09/18/21 2030 09/19/21 0418 09/19/21 0739  BP: (!) 141/74 (!) 151/69 (!) 147/77 (!) 147/72  Pulse: 100 99 93 97  Resp: (!) 21 (!) 23 16 19   Temp: 99.1 F (37.3 C) 99.1 F (37.3 C) 98.8 F (37.1 C) 99.8 F (37.7 C)  TempSrc: Oral Oral Oral Oral  SpO2: 100% 99% 100% 99%  Weight:      Height:       Physical Exam General: Obese female in NAD Heart: RRR, no murmurs, rubs or gallops Lungs: Diminished breath sounds due to body habitus. No wheezing, rhonchi or rales auscultated Abdomen: Soft, non-distended, +BS Extremities: trace edema b/l lower extremities Dialysis Access: R [facility]   Additional Objective Labs: Basic Metabolic Panel: Recent Labs  Lab 09/12/21 2050 09/13/21 0751 09/14/21 0636 09/16/21 0520  NA  --  130* 130* 129*  K  --  3.5 4.6 4.0  CL  --  94* 93* 91*  CO2  --  26 21* 25  GLUCOSE  --  104* 92 89  BUN  --  40* 43* 31*  CREATININE  --  7.85* 8.16* 6.95*  CALCIUM  --  7.5* 5.9* 8.2*  PHOS 5.9* 3.6 4.1  --    Liver Function Tests: Recent Labs  Lab 09/14/21 0636  AST 16  ALT 10  ALKPHOS 124  BILITOT 0.7  PROT 6.0*  ALBUMIN 1.9*  1.9*   No results for input(s): "LIPASE", "AMYLASE" in the last 168 hours. CBC: Recent Labs  Lab 09/14/21 0636 09/15/21 0608 09/16/21 0520 09/17/21 0530 09/18/21 0500  WBC 21.2* 22.4* 28.9* 29.9* 25.3*  NEUTROABS 17.0*  --   --   --   --   HGB 8.1* 8.7* 8.5* 8.7* 8.7*  HCT 26.2* 29.8* 28.7* 29.0* 29.5*  MCV 91.3 92.0 92.9 92.9 94.9  PLT 190 206 203 214 223   Blood Culture    Component Value Date/Time   SDES BLOOD RIGHT FOREARM 09/10/2021 1600   SPECREQUEST  09/10/2021 1600    BOTTLES DRAWN AEROBIC ONLY Blood Culture results may not be optimal due to an inadequate volume of blood received in culture bottles   CULT  09/10/2021 1600     NO GROWTH 5 DAYS Performed at [facility], [place] [address], [place], [place] [address]    REPTSTATUS 09/15/2021 FINAL 09/10/2021 1600    Cardiac Enzymes: No results for input(s): "CKTOTAL", "CKMB", "CKMBINDEX", "TROPONINI" in the last 168 hours. CBG: Recent Labs  Lab 09/16/21 2117 09/17/21 1216 09/17/21 1816 09/17/21 2129 09/18/21 0735  GLUCAP 96 75 109* 252* 116*   Iron Studies: No results for input(s): "IRON", "TIBC", "TRANSFERRIN", "FERRITIN" in the last 72 hours. @lablastinr3 @ Studies/Results: No results found. Medications:  sodium chloride Stopped (09/13/21 0341)   ceFEPime (MAXIPIME) IV Stopped (09/17/21 1149)    apixaban  5 mg Oral BID   aspirin EC  81 mg Oral Daily   atorvastatin  10 mg Oral QHS   calcitRIOL  1 mcg Oral Q T,Th,Sa-HD   calcium carbonate  1,500 mg Oral TID with meals   cinacalcet  30 mg Oral Q T,Th,Sa-HD   darbepoetin (ARANESP) injection - DIALYSIS  200 mcg Intravenous Q Thu-HD   diclofenac Sodium  2 g Topical QID   feeding supplement  1 Container Oral TID BM  gabapentin  100 mg Oral Q12H   gabapentin  200 mg Oral Q T,Th,Sa-HD   Gerhardt's butt cream   Topical BID   lactulose  30 g Oral TID   metoCLOPramide (REGLAN) injection  10 mg Intravenous Q6H   multivitamin  1 tablet Oral QHS   pantoprazole  40 mg Oral BID   polyethylene glycol  17 g Oral Daily   senna-docusate  1 tablet Oral BID   sodium chloride flush  10-40 mL Intracatheter Q12H   sodium chloride flush  3 mL Intravenous Q12H    Dialysis Orders: [name] TTS 4h 13min  151 kg  1K/2.5Ca bath  TDC  400 BFR - 4/29 > HBsAg neg w/ Abs < 10 Hep 2,600 load and 1,500 hourly  - mircera 200ug q2 due 09/10/21 - calcitriol 1 ug tiw - sensipar 30 mg tiw  Assessment/Plan: ESRD: On HD TTS. CXR 09/15/21 with evidence of volume overload. Weights here are variable but with very minimal PO intake, has likely lost some weight. Tolerated  3L UF with last HD.  Continue Midodrine with HD.  On systemic heparin so no heparin bolus with last HD, planned for 3L again today. Abdominal pain splenic infarct on CT: continue current pain management and heparin drip. Anemia of ESRD /acute illness-Hgb 8.7. Continue Aranesp 200 mcg weekly Metabolic bone disease -PO4 now at goal. Recheck RFP with HD today Plan to start binders once patient can tolerate solids. PAD status post right AKA/ LLE PAD: VVS consulted. Plan for staple removal in outpatient -VVS to see here if she is still inpatient. 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 Continue pain management Chronic hypoxic respiratory failure: Managed by primary Diabetes mellitus type 2= per primary     Anice Paganini, PA-C 09/19/2021, 8:48 AM  [facility] Pager: [phone]

## 2021-09-19 NOTE — Progress Notes (Signed)
OT Cancellation Note  Patient Details Name: Courtney Gray MRN: [id number] DOB: 26-Jan-1971   Cancelled Treatment:    Reason Eval/Treat Not Completed: Patient at procedure or test/ unavailable.  Continues at HD.  Anik Wesch D Daegen Berrocal 09/19/2021, 2:18 PM

## 2021-09-19 NOTE — Progress Notes (Signed)
Pharmacy Antibiotic Note  Courtney Gray is a 51 y.o. female admitted on 09/09/2021 with sepsis. Source unclear - pt has splenic infarct, recent AKA, and recent L hand infection (on doxycycline PTA). Pharmacy initially consulted for Vancomycin and Cefepime dosing.  Continues on Cefepime alone at this time given ongoing concern for infection with elevated WBC. Today is day #10 of IV antibiotics.  Pt is ESRD on HD TTS. WBC = 28.9>23.6, afebrile  Plan: Continue cefepime 2g IV qHD TTS Follow-up clinical progress and plans for continued abx therapy.  Temp (24hrs), Avg:99 F (37.2 C), Min:98 F (36.7 C), Max:99.8 F (37.7 C)  Recent Labs  Lab 09/13/21 0751 09/14/21 0636 09/15/21 0608 09/16/21 0520 09/17/21 0530 09/18/21 0500 09/19/21 0915  WBC 26.0* 21.2* 22.4* 28.9* 29.9* 25.3* 23.6*  CREATININE 7.85* 8.16*  --  6.95*  --   --  7.34*     Estimated Creatinine Clearance: 14.3 mL/min (A) (by C-G formula based on SCr of 7.34 mg/dL (H)).    Allergies  Allergen Reactions   Benzonatate Other (See Comments)    Hallucinations   Hydralazine Other (See Comments)    Hallucinations   Amoxicillin Itching and Nausea And Vomiting   Clonidine Other (See Comments)    Altered mental state, insomnia    Chlorhexidine Itching    Patient reports it is the CHG baths that cause her itching not the CHG used for caring for her IV/HD access.     Morphine Itching   Oxycodone Itching    Pt tolerated hospital admission 07/2021    Antimicrobials this admission: Cefepime 6/6 >>  Vancomycin 6/6 >> 6/13   Microbiology results: 6/6 BCx: Neg    Thank you for allowing pharmacy to be a part of this patient's care. [employer], Pharm.D., BCPS Clinical Pharmacist Clinical phone for 09/19/2021 from 7:30-3:00 is [phone].  **Pharmacist phone directory can be found on [email or web address] listed under [place].  09/19/2021 1:22 PM

## 2021-09-20 DIAGNOSIS — D735 Infarction of spleen: Secondary | ICD-10-CM | POA: Diagnosis not present

## 2021-09-20 LAB — GLUCOSE, CAPILLARY: Glucose-Capillary: 215 mg/dL — ABNORMAL HIGH (ref 70–99)

## 2021-09-20 NOTE — Progress Notes (Signed)
Patient ID: Courtney Gray, female   DOB: 05/05/1970, 51 y.o.   MRN: [id number]   LOS: 10 days   Subjective: Pt states finger is about the same. No changes that she can see.   Objective: Vital signs in last 24 hours: Temp:  [98 F (36.7 C)-99.4 F (37.4 C)] 99.4 F (37.4 C) (06/16 0500) Pulse Rate:  [83-107] 100 (06/16 0500) Resp:  [15-24] 20 (06/16 0500) BP: (112-165)/(58-87) 136/58 (06/16 0500) SpO2:  [92 %-100 %] 92 % (06/16 0500) Weight:  [163.8 kg-165 kg] 163.8 kg (06/15 1402) Last BM Date : 09/18/21   Laboratory  CBC Recent Labs    09/18/21 0500 09/19/21 0915  WBC 25.3* 23.6*  HGB 8.7* 8.4*  HCT 29.5* 29.1*  PLT 223 234   BMET Recent Labs    09/19/21 0915  NA 132*  K 4.5  CL 93*  CO2 26  GLUCOSE 88  BUN 38*  CREATININE 7.34*  CALCIUM 8.2*     Physical Exam General appearance: alert and no distress Left long finger -- Necrotic pad stable in appearance. P2 remains hyperpigmented but remains supple without TTP.   Assessment/Plan: Left long fingertip ischemia -- Will have Dr. Tempie Donning reassess.     Lisette Abu, PA-C Orthopedic Surgery [phone] 09/20/2021

## 2021-09-20 NOTE — Progress Notes (Signed)
Physical Therapy Treatment Patient Details Name: Courtney Gray MRN: [id number] DOB: Nov 08, 1970 Today's Date: 09/20/2021   History of Present Illness 51 y.o. female admitted 09/09/21 with severe LUQ abdominal pain, hypoglycemia. 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    PT Comments    Pt very happy to get OOB via lift today and performing HEP in sling. Pt with education for sequence of mobility with assist for rolling and pad placement. Pt encouraged to continue HEP, assist nursing with bed level mobility and maximove remains appropriate for transfers.   VSS on 2L     Recommendations for follow up therapy are one component of a multi-disciplinary discharge planning process, led by the attending physician.  Recommendations may be updated based on patient status, additional functional criteria and insurance authorization.  Follow Up Recommendations  Long-term institutional care without follow-up therapy     Assistance Recommended at Discharge Intermittent Supervision/Assistance  Patient can return home with the following Two people to help with walking and/or transfers;A lot of help with bathing/dressing/bathroom;Assistance with cooking/housework;Assist for transportation;Help with stairs or ramp for entrance   Equipment Recommendations  Other (comment) (eletric hoyer lift)    Recommendations for Other Services       Precautions / Restrictions Precautions Precautions: Fall;Other (comment) Precaution Comments: H/o R AKA (08/2021), chronic 2L O2     Mobility  Bed Mobility Overal bed mobility: Needs Assistance Bed Mobility: Supine to Sit, Sit to Supine Rolling: Mod assist   Supine to sit: HOB elevated, Mod assist     General bed mobility comments: mod assist to roll bil to place lift pad with bed flat with assist of HOB elevation and rails to achieve  sitting with lift utilized from bed to chair    Transfers Overall transfer level: Needs assistance   Transfers: Bed to chair/wheelchair/BSC             General transfer comment: lift from supine to sit with maximove. Pt thrilled to be in lift and requested to hang in air additional time with HEP in sling Transfer via Lift Equipment: Maximove  Ambulation/Gait                   Stairs             Wheelchair Mobility    Modified Rankin (Stroke Patients Only)       Balance                                            Cognition Arousal/Alertness: Awake/alert Behavior During Therapy: WFL for tasks assessed/performed Overall Cognitive Status: Within Functional Limits for tasks assessed                                          Exercises General Exercises - Lower Extremity Ankle Circles/Pumps: AROM, Left, Seated, 15 reps Long Arc Quad: AROM, Left, Seated, 15 reps    General Comments        Pertinent Vitals/Pain Pain Assessment Pain Score: 4  Pain Location: left finger and chest Pain Descriptors / Indicators: Discomfort Pain Intervention(s): Limited activity within patient's tolerance, Monitored during session, Repositioned    Home Living  Prior Function            PT Goals (current goals can now be found in the care plan section) Progress towards PT goals: Progressing toward goals    Frequency    Min 2X/week      PT Plan Current plan remains appropriate    Co-evaluation              AM-PAC PT "6 Clicks" Mobility   Outcome Measure  Help needed turning from your back to your side while in a flat bed without using bedrails?: A Lot Help needed moving from lying on your back to sitting on the side of a flat bed without using bedrails?: A Lot Help needed moving to and from a bed to a chair (including a wheelchair)?: Total Help needed standing up from a chair using  your arms (e.g., wheelchair or bedside chair)?: Total Help needed to walk in hospital room?: Total Help needed climbing 3-5 steps with a railing? : Total 6 Click Score: 8    End of Session Equipment Utilized During Treatment: Oxygen Activity Tolerance: Patient tolerated treatment well Patient left: in chair;with call bell/phone within reach;with chair alarm set;Other (comment) (with MD) Nurse Communication: Mobility status PT Visit Diagnosis: Other abnormalities of gait and mobility (R26.89);Muscle weakness (generalized) (M62.81)     Time: 0828-0900 PT Time Calculation (min) (ACUTE ONLY): 32 min  Charges:  $Therapeutic Activity: 23-37 mins                     Bayard Males, PT Acute Rehabilitation Services Office: [phone]    Sandy Salaam Sulaiman Imbert 09/20/2021, 11:57 AM

## 2021-09-20 NOTE — Progress Notes (Signed)
Occupational Therapy Treatment Patient Details Name: Courtney Gray MRN: [id number] DOB: 02-28-1971 Today's Date: 09/20/2021   History of present illness 51 y.o. female admitted 09/09/21 with severe LUQ abdominal pain, hypoglycemia. 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   OT comments  Patient received in recliner and asking to return to bed. Patient was assisted back to bed with use of maximove lift with therapist and nursing assisting. Patient performed rolling in bed to remove pad and required mod assist to roll and to maintain side lying position. Acute OT to continue to follow.    Recommendations for follow up therapy are one component of a multi-disciplinary discharge planning process, led by the attending physician.  Recommendations may be updated based on patient status, additional functional criteria and insurance authorization.    Follow Up Recommendations  Skilled nursing-short term rehab (<3 hours/day)    Assistance Recommended at Discharge Frequent or constant Supervision/Assistance  Patient can return home with the following  Two people to help with walking and/or transfers;Two people to help with bathing/dressing/bathroom;Assistance with cooking/housework;Direct supervision/assist for medications management;Assist for transportation;Help with stairs or ramp for entrance   Equipment Recommendations  Other (comment) (patient has all needed equipment)    Recommendations for Other Services      Precautions / Restrictions Precautions Precautions: Fall;Other (comment) Precaution Comments: H/o R AKA (08/2021), chronic 2L O2 Restrictions Weight Bearing Restrictions: Yes RLE Weight Bearing: Non weight bearing       Mobility Bed Mobility Overal bed mobility: Needs Assistance Bed Mobility: Rolling Rolling: Mod assist         General bed mobility  comments: mod assist for rolling and to maintain side lying to remove pads and apply lotion    Transfers Overall transfer level: Needs assistance   Transfers: Bed to chair/wheelchair/BSC             General transfer comment: transferred from recliner to bed with maximove lift Transfer via Lift Equipment: Maximove   Balance Overall balance assessment: Needs assistance Sitting-balance support: No upper extremity supported, Feet unsupported Sitting balance-[name] Scale: Fair Sitting balance - Comments: up in recliner and performed sitting without back support                                   ADL either performed or assessed with clinical judgement   ADL                                              Extremity/Trunk Assessment              Vision       Perception     Praxis      Cognition Arousal/Alertness: Awake/alert Behavior During Therapy: WFL for tasks assessed/performed Overall Cognitive Status: Within Functional Limits for tasks assessed                                          Exercises Exercises: General Upper Extremity General Exercises - Upper Extremity Shoulder Flexion: Strengthening, Both, 10 reps, Seated, Theraband Theraband Level (Shoulder Flexion): Level 2 (Red) Shoulder ABduction: Strengthening,  10 reps, Both, Seated, Theraband Theraband Level (Shoulder Abduction): Level 2 (Red) Shoulder ADduction: Strengthening, Both, 10 reps, Seated, Theraband Theraband Level (Shoulder Adduction): Level 2 (Red) Elbow Flexion: Strengthening, Both, 10 reps, Seated, Theraband Theraband Level (Elbow Flexion): Level 2 (Red) Elbow Extension: Strengthening, Both, 10 reps, Seated, Theraband Theraband Level (Elbow Extension): Level 2 (Red)    Shoulder Instructions       General Comments      Pertinent Vitals/ Pain       Pain Assessment Pain Assessment: Faces Faces Pain Scale: Hurts little more Pain  Location: right residual limb during transfer and bed mobility Pain Descriptors / Indicators: Discomfort, Grimacing Pain Intervention(s): Limited activity within patient's tolerance, Monitored during session, Repositioned  Home Living                                          Prior Functioning/Environment              Frequency  Min 2X/week        Progress Toward Goals  OT Goals(current goals can now be found in the care plan section)  Progress towards OT goals: Progressing toward goals  Acute Rehab OT Goals Patient Stated Goal: get better OT Goal Formulation: With patient Time For Goal Achievement: 09/29/21 Potential to Achieve Goals: Fair ADL Goals Pt Will Perform Lower Body Bathing: with mod assist;with adaptive equipment;sitting/lateral leans Pt Will Perform Lower Body Dressing: with mod assist;with adaptive equipment;sitting/lateral leans Pt Will Transfer to Toilet: with transfer board;with min guard assist;bedside commode Additional ADL Goal #1: Pt will tolerate sitting EOB for 5+ mins with no support to complete ADL tasks. Additional ADL Goal #2: Pt willcomplete bed mobility with min guard, as a precursor to Seated ADL's.  Plan Discharge plan remains appropriate    Co-evaluation                 AM-PAC OT "6 Clicks" Daily Activity     Outcome Measure   Help from another person eating meals?: None Help from another person taking care of personal grooming?: A Little Help from another person toileting, which includes using toliet, bedpan, or urinal?: A Lot Help from another person bathing (including washing, rinsing, drying)?: A Lot Help from another person to put on and taking off regular upper body clothing?: A Little Help from another person to put on and taking off regular lower body clothing?: Total 6 Click Score: 15    End of Session Equipment Utilized During Treatment: Oxygen;Other (comment) (maximove lift)  OT Visit Diagnosis:  Other abnormalities of gait and mobility (R26.89);Unsteadiness on feet (R26.81);Muscle weakness (generalized) (M62.81);History of falling (Z91.81);Pain Pain - Right/Left: Right Pain - part of body: Leg   Activity Tolerance Patient tolerated treatment well   Patient Left in bed;with call bell/phone within reach;with nursing/sitter in room;with family/visitor present   Nurse Communication Mobility status        Time: 0263-7858 OT Time Calculation (min): 15 min  Charges: OT General Charges $OT Visit: 1 Visit OT Treatments $Therapeutic Activity: 8-22 mins $Therapeutic Exercise: 23-37 mins  Lodema Hong, OTA Acute Rehabilitation Services  Pager [phone] Office [place] 09/20/2021, 2:20 PM

## 2021-09-20 NOTE — Progress Notes (Signed)
Occupational Therapy Treatment Patient Details Name: Courtney Gray MRN: [id number] DOB: [birth year] Today's Date: 09/20/2021   History of present illness 51 y.o. female admitted 09/09/21 with severe LUQ abdominal pain, hypoglycemia. 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   OT comments  Patient received in recliner and agreeable to OT session. Patient provided with red therapy band and instructions on BUE strengthening exercises to increase independence with bed mobility and transfers. Patient sat up in recliner without back support for most exercises. Patient to continue to be followed by acute OT.    Recommendations for follow up therapy are one component of a multi-disciplinary discharge planning process, led by the attending physician.  Recommendations may be updated based on patient status, additional functional criteria and insurance authorization.    Follow Up Recommendations  Skilled nursing-short term rehab (<3 hours/day)    Assistance Recommended at Discharge Frequent or constant Supervision/Assistance  Patient can return home with the following  Two people to help with walking and/or transfers;Two people to help with bathing/dressing/bathroom;Assistance with cooking/housework;Direct supervision/assist for medications management;Assist for transportation;Help with stairs or ramp for entrance   Equipment Recommendations  None recommended by OT (patient has all needed equipment)    Recommendations for Other Services      Precautions / Restrictions Precautions Precautions: Fall;Other (comment) Precaution Comments: H/o R AKA (08/2021), chronic 2L O2 Restrictions Weight Bearing Restrictions: Yes RLE Weight Bearing: Non weight bearing       Mobility Bed Mobility                    Transfers                         Balance Overall  balance assessment: Needs assistance Sitting-balance support: No upper extremity supported, Feet unsupported Sitting balance-[name] Scale: Fair Sitting balance - Comments: up in recliner and performed sitting without back support                                   ADL either performed or assessed with clinical judgement   ADL                                              Extremity/Trunk Assessment              Vision       Perception     Praxis      Cognition Arousal/Alertness: Awake/alert Behavior During Therapy: WFL for tasks assessed/performed Overall Cognitive Status: Within Functional Limits for tasks assessed                                          Exercises Exercises: General Upper Extremity General Exercises - Upper Extremity Shoulder Flexion: Strengthening, Both, 10 reps, Seated, Theraband Theraband Level (Shoulder Flexion): Level 2 (Red) Shoulder ABduction: Strengthening, 10 reps, Both, Seated, Theraband Theraband Level (Shoulder Abduction): Level 2 (Red) Shoulder ADduction: Strengthening, Both, 10 reps, Seated, Theraband Theraband Level (Shoulder Adduction): Level 2 (Red) Elbow Flexion: Strengthening, Both, 10 reps, Seated, Theraband Theraband Level (Elbow Flexion): Level  2 (Red) Elbow Extension: Strengthening, Both, 10 reps, Seated, Theraband Theraband Level (Elbow Extension): Level 2 (Red)    Shoulder Instructions       General Comments      Pertinent Vitals/ Pain       Pain Assessment Pain Assessment: Faces Faces Pain Scale: Hurts little more Pain Location: left finger and chest Pain Descriptors / Indicators: Discomfort Pain Intervention(s): Limited activity within patient's tolerance, Monitored during session  Home Living                                          Prior Functioning/Environment              Frequency  Min 2X/week        Progress Toward  Goals  OT Goals(current goals can now be found in the care plan section)  Progress towards OT goals: Progressing toward goals  Acute Rehab OT Goals Patient Stated Goal: get better OT Goal Formulation: With patient Time For Goal Achievement: 09/29/21 Potential to Achieve Goals: Fair ADL Goals Pt Will Perform Lower Body Bathing: with mod assist;with adaptive equipment;sitting/lateral leans Pt Will Perform Lower Body Dressing: with mod assist;with adaptive equipment;sitting/lateral leans Pt Will Transfer to Toilet: with transfer board;with min guard assist;bedside commode Additional ADL Goal #1: Pt will tolerate sitting EOB for 5+ mins with no support to complete ADL tasks. Additional ADL Goal #2: Pt willcomplete bed mobility with min guard, as a precursor to Seated ADL's.  Plan Discharge plan needs to be updated    Co-evaluation                 AM-PAC OT "6 Clicks" Daily Activity     Outcome Measure   Help from another person eating meals?: None Help from another person taking care of personal grooming?: A Little Help from another person toileting, which includes using toliet, bedpan, or urinal?: A Lot Help from another person bathing (including washing, rinsing, drying)?: A Lot Help from another person to put on and taking off regular upper body clothing?: A Little Help from another person to put on and taking off regular lower body clothing?: Total 6 Click Score: 15    End of Session Equipment Utilized During Treatment: Oxygen  OT Visit Diagnosis: Other abnormalities of gait and mobility (R26.89);Unsteadiness on feet (R26.81);Muscle weakness (generalized) (M62.81);History of falling (Z91.81);Pain Pain - Right/Left: Right Pain - part of body: Leg   Activity Tolerance Patient tolerated treatment well   Patient Left in chair;with call bell/phone within reach;with family/visitor present   Nurse Communication Mobility status;Need for lift equipment        Time:  [phone] OT Time Calculation (min): 31 min  Charges: OT General Charges $OT Visit: 1 Visit OT Treatments $Therapeutic Exercise: 23-37 mins  Lodema Hong, [place]  Pager [phone] Office [place] 09/20/2021, 2:12 PM

## 2021-09-20 NOTE — TOC Progression Note (Addendum)
Transition of Care [facility]) - Progression Note    Patient Details  Name: Courtney Gray MRN: [id number] Date of Birth: 04-Sep-1970  Transition of [facility]) CM/SW [place], [place] Phone Number: 09/20/2021, 3:14 PM  Clinical Narrative:     TOC continues to follow patient.No LTC SNF bed offers. Barriers to placement. Hassan Rowan with [employer] leadership made referral to [facility] for patient. TOC will continue to follow and assist with patients dc planning needs.  Expected Discharge Plan: Skilled Nursing Facility Barriers to Discharge: Continued Medical Work up  Expected Discharge Plan and Services Expected Discharge Plan: [place] In-house Referral: Clinical Social Work Discharge Planning Services: CM Consult Post Acute Care Choice: Durable Medical Equipment Living arrangements for the past 2 months: Single Family Home                                       Social Determinants of Health (SDOH) Interventions    Readmission Risk Interventions    09/16/2021    4:36 PM 08/28/2021   11:50 AM 07/12/2021   12:38 PM  Readmission Risk Prevention Plan  Transportation Screening Complete Complete Complete  Medication Review [employer])  Complete Complete  PCP or Specialist appointment within 3-5 days of discharge  Complete Complete  HRI or Home Care Consult Complete Complete Complete  SW Recovery Care/Counseling Consult Complete Complete Complete  Palliative Care Screening  Not Applicable Not Applicable  Skilled Nursing Facility Complete Not Applicable Complete

## 2021-09-20 NOTE — Progress Notes (Signed)
Progress Note  Patient: Courtney Gray [id number]:[id number] DOB: 03/24/1971  DOA: 09/09/2021  DOS: 09/20/2021    Brief hospital course: Courtney Gray is a 51 y.o. 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 She was hypoglycemic, febrile with CT revealing hypoenhancement in the spleen consistent with infarct.   Assessment and Plan:  Distributive shock with lactic acidosis and presumed acute infection:  - Pt has gangrene of left third digit and splenic infarcts which are the likely culprit for presentation. No other definite nidus of infection currently noted.  - Continuing empiric abx (Cefepime with dialysis) for now, WBC remains grossly elevated as does her procalcitonin -concerning her left finger may be nidus for ongoing infection - Weaned from pressors 6/8.  Ultimately off midodrine with stabilizing blood pressure. - Monitor blood cultures (NGTD)   IDT2DM uncontrolled with hypoglycemia:  - HbA1c 5.7% and recurrently hypoglycemic here requiring D10 at one point (now off). - Encourage po intake. D/w pt and daughter. We removed NGT w/trickle feeds with plans for cor trak which the patient has declined - Hold insulin and follow clinically. Glucagon prn - May need to restart sliding scale in near future pending labs  Splenic infarct:  - No embolic source noted on echo or CT's - Unclear if possibly infectious vs embolic given above - Heparin gtt- transition to eliquis 5 BID - Pain control with oral dilaudid.   Severe PAD s/p right AKA:  - Significant vascular compromise of LLE as well.  - VVS consulted, planning outpatient suture removal 6/13, will see here if still admitted. Recommended monitoring and protected LLE as this would be at high risk for amputation if wounded. - Continue  statin, anticoagulation. Can restart ASA once more stable.  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  - Hand surgery consulted, recommending outpatient follow up to allow for evolutionary changes prior to amputation.  - Discussed with Ortho to have reevaluation given her prolonged hospital stay to reevaluate for possible amputation here versus outpatient, appreciate insight recommendations. - Vascular surgery stated she has multiphasic flow per recent duplex - Pain control: Gabapentin, tylenol, dilaudid  Left second toe cyanosis, cannot rule out early necrosis -Extremely poor vasculature given above, orthopedics has been asked to reevaluate the hand, would also appreciate insight into recommendations for her left second toe. Follow up Podiatry (Dr. Posey Pronto) outpatient after demarcation for definitive care  Acute on chronic hypoxic respiratory failure, suspected OHS, OSA, resolved:  - Continue supplemental O2 to maintain SpO2 90-95% to avoid risk of hypercarbia. Continue BiPAP qHS. On abx and anticoagulation as above already. - Atelectasis and acute on chronic hypoventilation strongly suspected to be contributing.  Continue physical therapy, early ambulation and incentive spirometer - Bronchodilator prn, no wheezing currently -Chronically on 2 L nasal cannula, back to baseline at this point  GERD:  - Augmented PPI to BID, added carafate  Nausea:  - Continue zofran prn and compazine prn refractory symptoms.  ESRD:  - Back on TTS scheduled for HD per nephrology.    Poor IV access: s/p left EJ triple lumen catheter placed by IR 6/7. This is still needed at this time.  Morbid obesity: Body mass index is 60.09 kg/m.   HTN: Has been requiring midodrine.  - May need to  reinitiate antihypertensive Tx (norvasc, coreg) if BPs rise after stopping midodrine.  Demand myocardial ischemia: Mild troponin elevation without angina.  - Defer further work up at this time.    Anemia of ESRD:  - Defer to nephrology whether ESA should be held. No active bleeding, will monitor hgb on anticoagulation.  HLD:  - Continue statin  Constipation: LBM 6/8 - Continue regular bowel regimen.   Stage 2 left and buttocks pressure injuries, POA:  - Offload as able.   Needs to start PT/OT, ordered. TOC consulted for LTC vs. SNF placement  Subjective: No acute issues or events overnight  Objective: Vitals:   09/19/21 1330 09/19/21 1402 09/19/21 2105 09/20/21 0500  BP: (!) 154/87 (!) 163/70 (!) 128/59 (!) 136/58  Pulse: (!) 101 99 (!) 102 100  Resp: 20 (!) 23 (!) 24 20  Temp:  98.4 F (36.9 C) 99.4 F (37.4 C) 99.4 F (37.4 C)  TempSrc:  Oral Oral Oral  SpO2: 100% 100% 100% 92%  Weight:  (!) 163.8 kg    Height:       Gen: 51 y.o. female in no distress Pulm: Nonlabored at rest without wheezes or crackles anterolaterally. CV: Regular rate and rhythm. Soft systolic murmur at base without any new murmur, rub, or gallop. UTD JVD, no pitting dependent edema. GI: Abdomen soft, not significantly tender, non-distended, with normoactive bowel sounds.  Ext: Warm, no deformities Skin: No new rashes, lesions or ulcers on visualized skin. Left 3rd finger with foam pad dressing c/d/I without spreading erythema or necrosis or odor at this time. Neuro: Alert and oriented. No focal neurological deficits. Psych: Judgement and insight appear fair. Mood euthymic & affect congruent. Behavior is appropriate.    Data Personally reviewed: CBC: Recent Labs  Lab 09/14/21 0636 09/15/21 0608 09/16/21 0520 09/17/21 0530 09/18/21 0500 09/19/21 0915  WBC 21.2* 22.4* 28.9* 29.9* 25.3* 23.6*  NEUTROABS 17.0*  --   --   --   --   --   HGB 8.1* 8.7* 8.5* 8.7* 8.7* 8.4*  HCT 26.2* 29.8* 28.7* 29.0* 29.5* 29.1*  MCV 91.3 92.0 92.9 92.9 94.9 95.4  PLT 190 206 203 214 223 211    Basic Metabolic Panel: Recent Labs  Lab 09/14/21 0636 09/16/21 0520 09/19/21 0915  NA 130* 129* 132*   K 4.6 4.0 4.5  CL 93* 91* 93*  CO2 21* 25 26  GLUCOSE 92 89 88  BUN 43* 31* 38*  CREATININE 8.16* 6.95* 7.34*  CALCIUM 5.9* 8.2* 8.2*  PHOS 4.1  --  4.8*    GFR: Estimated Creatinine Clearance: 14.3 mL/min (A) (by C-G formula based on SCr of 7.34 mg/dL (H)). Liver Function Tests: Recent Labs  Lab 09/14/21 0636 09/19/21 0915  AST 16  --   ALT 10  --   ALKPHOS 124  --   BILITOT 0.7  --   PROT 6.0*  --   ALBUMIN 1.9*  1.9* 2.0*    No results for input(s): "LIPASE", "AMYLASE" in the last 168 hours.  No results for input(s): "AMMONIA" in the last 168 hours. Coagulation Profile: No results for input(s): "INR", "PROTIME" in the last 168 hours.  Cardiac Enzymes: No results for input(s): "CKTOTAL", "CKMB", "CKMBINDEX", "TROPONINI" in the last 168 hours. BNP (last 3 results) No results for input(s): "PROBNP" in the last 8760 hours. HbA1C: No results for input(s): "HGBA1C" in the last 72 hours. CBG: Recent Labs  Lab 09/17/21 1216 09/17/21 1816 09/17/21 2129 09/18/21 0735 09/19/21 1637  GLUCAP  75 109* 252* 116* 80    Lipid Profile: No results for input(s): "CHOL", "HDL", "LDLCALC", "TRIG", "CHOLHDL", "LDLDIRECT" in the last 72 hours. Thyroid Function Tests: No results for input(s): "TSH", "T4TOTAL", "FREET4", "T3FREE", "THYROIDAB" in the last 72 hours. Anemia Panel: No results for input(s): "VITAMINB12", "FOLATE", "FERRITIN", "TIBC", "IRON", "RETICCTPCT" in the last 72 hours. Urine analysis: No results found for: "COLORURINE", "APPEARANCEUR", "LABSPEC", "PHURINE", "GLUCOSEU", "HGBUR", "BILIRUBINUR", "KETONESUR", "PROTEINUR", "UROBILINOGEN", "NITRITE", "LEUKOCYTESUR" Recent Results (from the past 240 hour(s))  Culture, blood (routine x 2)     Status: None   Collection Time: 09/10/21  4:00 PM   Specimen: BLOOD RIGHT FOREARM  Result Value Ref Range Status   Specimen Description BLOOD RIGHT FOREARM  Final   Special Requests   Final    BOTTLES DRAWN AEROBIC ONLY Blood  Culture results may not be optimal due to an inadequate volume of blood received in culture bottles   Culture   Final    NO GROWTH 5 DAYS Performed at [facility], [address], [place], [place] [address]    Report Status 09/15/2021 FINAL  Final  MRSA Next Gen by PCR, Nasal     Status: None   Collection Time: 09/10/21  5:36 PM   Specimen: Nasal Mucosa; Nasal Swab  Result Value Ref Range Status   MRSA by PCR Next Gen NOT DETECTED NOT DETECTED Final    Comment: (NOTE) The GeneXpert MRSA Assay (FDA approved for NASAL specimens only), is one component of a comprehensive MRSA colonization surveillance program. It is not intended to diagnose MRSA infection nor to guide or monitor treatment for MRSA infections. Test performance is not FDA approved in patients less than 24 years old. Performed at [facility], [place] [address], [place], [place] [address]      No results found.   Family Communication: Daughter at bedside 6/11  Disposition: Status is: Inpatient Remains inpatient appropriate because: Remains critically ill Planned Discharge Destination:  TBD  - SNF vs. LTC.  Little Ishikawa, MD 09/20/2021 8:00 AM Page by [email or web address]

## 2021-09-20 NOTE — Progress Notes (Signed)
[facility] Progress Note   Subjective:  Seen in room - working with PT at time of my visit. Denied CP/dyspnea. Still with some finger and R 2nd toe pain. Dialyzed yesterday without issues - net UF appears to have been around 1.2L.  Objective Vitals:   09/19/21 1330 09/19/21 1402 09/19/21 2105 09/20/21 0500  BP: (!) 154/87 (!) 163/70 (!) 128/59 (!) 136/58  Pulse: (!) 101 99 (!) 102 100  Resp: 20 (!) 23 (!) 24 20  Temp:  98.4 F (36.9 C) 99.4 F (37.4 C) 99.4 F (37.4 C)  TempSrc:  Oral Oral Oral  SpO2: 100% 100% 100% 92%  Weight:  (!) 163.8 kg    Height:       Physical Exam General: Obese woman, NAD.  Heart: RRR; no murmur Lungs: CTA anteriorly - unable to do posterior exam at time of my visit Abdomen: soft Extremities: R AKA; LLE with trace edema, blackened L 2nd toe Dialysis Access: [facility]  Additional Objective Labs: Basic Metabolic Panel: Recent Labs  Lab 09/14/21 0636 09/16/21 0520 09/19/21 0915  NA 130* 129* 132*  K 4.6 4.0 4.5  CL 93* 91* 93*  CO2 21* 25 26  GLUCOSE 92 89 88  BUN 43* 31* 38*  CREATININE 8.16* 6.95* 7.34*  CALCIUM 5.9* 8.2* 8.2*  PHOS 4.1  --  4.8*   Liver Function Tests: Recent Labs  Lab 09/14/21 0636 09/19/21 0915  AST 16  --   ALT 10  --   ALKPHOS 124  --   BILITOT 0.7  --   PROT 6.0*  --   ALBUMIN 1.9*  1.9* 2.0*   CBC: Recent Labs  Lab 09/14/21 0636 09/15/21 0608 09/16/21 0520 09/17/21 0530 09/18/21 0500 09/19/21 0915  WBC 21.2* 22.4* 28.9* 29.9* 25.3* 23.6*  NEUTROABS 17.0*  --   --   --   --   --   HGB 8.1* 8.7* 8.5* 8.7* 8.7* 8.4*  HCT 26.2* 29.8* 28.7* 29.0* 29.5* 29.1*  MCV 91.3 92.0 92.9 92.9 94.9 95.4  PLT 190 206 203 214 223 234   CBG: Recent Labs  Lab 09/17/21 1216 09/17/21 1816 09/17/21 2129 09/18/21 0735 09/19/21 1637  GLUCAP 75 109* 252* 116* 80   Medications:  sodium chloride Stopped (09/13/21 0341)   ceFEPime (MAXIPIME) IV Stopped (09/19/21 1924)    apixaban  5 mg Oral BID    aspirin EC  81 mg Oral Daily   atorvastatin  10 mg Oral QHS   calcitRIOL  1 mcg Oral Q T,Th,Sa-HD   calcium carbonate  1,500 mg Oral TID with meals   cinacalcet  30 mg Oral Q T,Th,Sa-HD   darbepoetin (ARANESP) injection - DIALYSIS  200 mcg Intravenous Q Thu-HD   diclofenac Sodium  2 g Topical QID   feeding supplement  1 Container Oral TID BM   gabapentin  100 mg Oral Q12H   gabapentin  200 mg Oral Q T,Th,Sa-HD   Gerhardt's butt cream   Topical BID   lactulose  30 g Oral TID   metoCLOPramide (REGLAN) injection  10 mg Intravenous Q6H   multivitamin  1 tablet Oral QHS   pantoprazole  40 mg Oral BID   polyethylene glycol  17 g Oral Daily   senna-docusate  1 tablet Oral BID   sodium chloride flush  10-40 mL Intracatheter Q12H   sodium chloride flush  3 mL Intravenous Q12H    Dialysis Orders: [name] TTS 4h 62min  151 kg  1K/2.5Ca bath  TDC  400 BFR - 4/29 > HBsAg neg w/ Abs < 10 Hep 2,600 load and 1,500 hourly  - mircera 200ug q2 due 09/10/21 - calcitriol 1 ug tiw - sensipar 30 mg tiw   Assessment/Plan: Septic shock: On admit, requiring short-term pressors, now off. WBC still high, trending down. 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 Splenic Infarct/abdominal pain: Initially on heparin drip -> now Eliquis BID. ESRD: Continue HD per usual TTS schedule. UF as tolerated with HD. HTN/volume: Variable BP, weights much higher here although likely inaccurate if weighing in bed. Will try to maximize UF with next HD. Anemia of ESRD: Hgb 8.4, low/stable. Continue Aranesp 200 mcg q Thursday. Secondary Hyperparathyroidism: CorrCa/Phos ok, no binders at the moment. Continue VDRA + sensipar. Chronic hypoxic respiratory failure: Managed by primary T2DM Obesity  [name], PA-C 09/20/2021, 11:14 AM  [employer]

## 2021-09-21 DIAGNOSIS — D735 Infarction of spleen: Secondary | ICD-10-CM | POA: Diagnosis not present

## 2021-09-21 LAB — GLUCOSE, CAPILLARY
Glucose-Capillary: 145 mg/dL — ABNORMAL HIGH (ref 70–99)
Glucose-Capillary: 162 mg/dL — ABNORMAL HIGH (ref 70–99)
Glucose-Capillary: 245 mg/dL — ABNORMAL HIGH (ref 70–99)

## 2021-09-21 LAB — BASIC METABOLIC PANEL
Anion gap: 11 (ref 5–15)
BUN: 36 mg/dL — ABNORMAL HIGH (ref 6–20)
CO2: 26 mmol/L (ref 22–32)
Calcium: 7.9 mg/dL — ABNORMAL LOW (ref 8.9–10.3)
Chloride: 91 mmol/L — ABNORMAL LOW (ref 98–111)
Creatinine, Ser: 6.8 mg/dL — ABNORMAL HIGH (ref 0.44–1.00)
GFR, Estimated: 7 mL/min — ABNORMAL LOW (ref >60)
Glucose, Bld: 202 mg/dL — ABNORMAL HIGH (ref 70–99)
Potassium: 4 mmol/L (ref 3.5–5.1)
Sodium: 128 mmol/L — ABNORMAL LOW (ref 135–145)

## 2021-09-21 LAB — CBC
HCT: 27.6 % — ABNORMAL LOW (ref 36.0–46.0)
Hemoglobin: 8 g/dL — ABNORMAL LOW (ref 12.0–15.0)
MCH: 27.3 pg (ref 26.0–34.0)
MCHC: 29 g/dL — ABNORMAL LOW (ref 30.0–36.0)
MCV: 94.2 fL (ref 80.0–100.0)
Platelets: 266 10*3/uL (ref 150–400)
RBC: 2.93 MIL/uL — ABNORMAL LOW (ref 3.87–5.11)
RDW: 20.4 % — ABNORMAL HIGH (ref 11.5–15.5)
WBC: 36.4 10*3/uL — ABNORMAL HIGH (ref 4.0–10.5)
nRBC: 0.1 % (ref 0.0–0.2)

## 2021-09-21 MED ORDER — HEPARIN SODIUM (PORCINE) 1000 UNIT/ML IJ SOLN
INTRAMUSCULAR | Status: AC
  Filled 2021-09-21: qty 4

## 2021-09-21 NOTE — Progress Notes (Signed)
Blood culture x2 drawn and sent to lab.

## 2021-09-21 NOTE — Progress Notes (Signed)
Progress Note  Patient: Courtney Gray MGQ:[id number] DOB: 16-Jul-1970  DOA: 09/09/2021  DOS: 09/21/2021    Brief hospital course: Courtney Gray is a 51 y.o. 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 She was hypoglycemic, febrile with CT revealing hypoenhancement in the spleen consistent with infarct.   Assessment and Plan:  Distributive shock with lactic acidosis and presumed acute infection:  - Pt has gangrene of left third digit and splenic infarcts which are the likely culprit for presentation. No other definite nidus of infection currently noted.  - Continuing empiric abx (Cefepime with dialysis) for now -White count continues to climb currently 38, repeat cultures pending -concern left finger may be nidus for ongoing infection - Weaned from pressors 6/8.  Ultimately off midodrine with stabilizing blood pressure. - Monitor blood cultures (NGTD)   IDT2DM uncontrolled with hypoglycemia:  - HbA1c 5.7% and recurrently hypoglycemic here requiring D10 at one point (now off). - Encourage po intake. D/w pt and daughter. We removed NGT w/trickle feeds with plans for cor trak which the patient has declined - Hold insulin and follow clinically. Glucagon prn - May need to restart sliding scale in near future pending labs  Splenic infarct:  - No embolic source noted on echo or CT's - Unclear if possibly infectious vs embolic given above - Heparin gtt- transition to eliquis 5 BID - Pain control with oral dilaudid.   Severe PAD s/p right AKA:  - Significant vascular compromise of LLE as well.  - VVS consulted, planning outpatient suture removal 6/13, will see here if still admitted. Recommended monitoring and protected LLE as this would be at high risk for amputation if wounded. -  Continue statin, anticoagulation. Can restart ASA once more stable.  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  - Hand surgery consulted, recommending outpatient follow up to allow for evolutionary changes prior to amputation.  - Discussed with Ortho to have reevaluation given her prolonged hospital stay to reevaluate for possible amputation here versus outpatient, appreciate insight recommendations. - Vascular surgery stated she has multiphasic flow per recent duplex - Pain control: Gabapentin, tylenol, dilaudid  Left second toe cyanosis, cannot rule out early necrosis -Extremely poor vasculature given above, orthopedics has been asked to reevaluate the hand, would also appreciate insight into recommendations for her left second toe. Follow up Podiatry (Dr. Posey Pronto) outpatient after demarcation for definitive care/possible amputation  Acute on chronic hypoxic respiratory failure, suspected OHS, OSA, resolved:  - Continue supplemental O2 to maintain SpO2 90-95% to avoid risk of hypercarbia. Continue BiPAP qHS. On abx and anticoagulation as above already. - Atelectasis and acute on chronic hypoventilation strongly suspected to be contributing.  Continue physical therapy, early ambulation and incentive spirometer - Bronchodilator prn, no wheezing currently -Chronically on 2 L nasal cannula, back to baseline at this point  GERD:  - Augmented PPI to BID, added carafate  Nausea:  - Continue zofran prn and compazine prn refractory symptoms.  ESRD:  - Back on TTS scheduled for HD per nephrology.    Poor IV access: s/p left EJ triple lumen catheter placed by IR 6/7. This is still needed at this time.  Morbid obesity: Body mass index is 60.09 kg/m.   HTN: Has been requiring midodrine.  - May  need to reinitiate antihypertensive Tx (norvasc, coreg) if BPs rise after stopping midodrine.  Demand myocardial ischemia: Mild troponin elevation without angina.  - Defer further  work up at this time.   Anemia of ESRD:  - Defer to nephrology whether ESA should be held. No active bleeding, will monitor hgb on anticoagulation.  HLD:  - Continue statin  Constipation: LBM 6/8 - Continue regular bowel regimen.   Stage 2 left and buttocks pressure injuries, POA:  - Offload as able.   Needs to start PT/OT, ordered. TOC consulted for LTC vs. SNF placement  Subjective: No acute issues or events overnight  Objective: Vitals:   09/20/21 0500 09/20/21 1418 09/20/21 1927 09/21/21 0505  BP: (!) 136/58 (!) 147/65 127/62 129/76  Pulse: 100 (!) 102 98   Resp: 20 20 (!) 26 18  Temp: 99.4 F (37.4 C) 98.9 F (37.2 C) 99.9 F (37.7 C) 99.1 F (37.3 C)  TempSrc: Oral Oral Oral Oral  SpO2: 92% 100%    Weight:      Height:       Gen: 51 y.o. female in no distress Pulm: Nonlabored at rest without wheezes or crackles anterolaterally. CV: Regular rate and rhythm. Soft systolic murmur at base without any new murmur, rub, or gallop. UTD JVD, no pitting dependent edema. GI: Abdomen soft, not significantly tender, non-distended, with normoactive bowel sounds.  Ext: Warm, no deformities Skin: No new rashes, lesions or ulcers on visualized skin. Left 3rd finger with foam pad dressing c/d/I without spreading erythema or necrosis or odor at this time. Neuro: Alert and oriented. No focal neurological deficits. Psych: Judgement and insight appear fair. Mood euthymic & affect congruent. Behavior is appropriate.    Data Personally reviewed: CBC: Recent Labs  Lab 09/16/21 0520 09/17/21 0530 09/18/21 0500 09/19/21 0915 09/21/21 0521  WBC 28.9* 29.9* 25.3* 23.6* 36.4*  HGB 8.5* 8.7* 8.7* 8.4* 8.0*  HCT 28.7* 29.0* 29.5* 29.1* 27.6*  MCV 92.9 92.9 94.9 95.4 94.2  PLT 203 214 223 234 628    Basic Metabolic Panel: Recent Labs  Lab 09/16/21 0520 09/19/21 0915 09/21/21 0521  NA 129* 132* 128*  K 4.0 4.5 4.0  CL 91* 93* 91*  CO2 25 26 26   GLUCOSE 89 88 202*  BUN 31*  38* 36*  CREATININE 6.95* 7.34* 6.80*  CALCIUM 8.2* 8.2* 7.9*  PHOS  --  4.8*  --     GFR: Estimated Creatinine Clearance: 15.4 mL/min (A) (by C-G formula based on SCr of 6.8 mg/dL (H)). Liver Function Tests: Recent Labs  Lab 09/19/21 0915  ALBUMIN 2.0*    No results for input(s): "LIPASE", "AMYLASE" in the last 168 hours.  No results for input(s): "AMMONIA" in the last 168 hours. Coagulation Profile: No results for input(s): "INR", "PROTIME" in the last 168 hours.  Cardiac Enzymes: No results for input(s): "CKTOTAL", "CKMB", "CKMBINDEX", "TROPONINI" in the last 168 hours. BNP (last 3 results) No results for input(s): "PROBNP" in the last 8760 hours. HbA1C: No results for input(s): "HGBA1C" in the last 72 hours. CBG: Recent Labs  Lab 09/17/21 1816 09/17/21 2129 09/18/21 0735 09/19/21 1637 09/20/21 1656  GLUCAP 109* 252* 116* 80 215*    Lipid Profile: No results for input(s): "CHOL", "HDL", "LDLCALC", "TRIG", "CHOLHDL", "LDLDIRECT" in the last 72 hours. Thyroid Function Tests: No results for input(s): "TSH", "T4TOTAL", "FREET4", "T3FREE", "THYROIDAB" in the last 72 hours. Anemia Panel: No results for input(s): "VITAMINB12", "FOLATE", "FERRITIN", "TIBC", "IRON", "RETICCTPCT" in the last 72 hours.  Urine analysis: No results found for: "COLORURINE", "APPEARANCEUR", "LABSPEC", "PHURINE", "GLUCOSEU", "HGBUR", "BILIRUBINUR", "KETONESUR", "PROTEINUR", "UROBILINOGEN", "NITRITE", "LEUKOCYTESUR" No results found for this or any previous visit (from the past 240 hour(s)).    No results found.   Family Communication: Daughter at bedside 6/11  Disposition: Status is: Inpatient Remains inpatient appropriate because: Remains critically ill Planned Discharge Destination:  TBD  - SNF vs. LTC.  Little Ishikawa, MD 09/21/2021 7:21 AM Page by [email or web address]

## 2021-09-21 NOTE — Progress Notes (Signed)
[facility] Progress Note   Subjective:  Seen on HD - 3L UFG and tolerating. No CP/dyspnea, but remains on nasal O2.   Objective Vitals:   09/21/21 0800 09/21/21 0825 09/21/21 0900 09/21/21 0930  BP: 130/66 (!) 160/47 (!) 138/51 (!) 146/76  Pulse: 91 89 91 97  Resp: 16 20 16 20   Temp:      TempSrc:      SpO2: 100% 99% 100% 99%  Weight:      Height:       Physical Exam General: Obese woman, NAD. Nasal O2 in place. Heart: RRR; no murmur Lungs: CTA anteriorly Abdomen: soft Extremities: R AKA; LLE with trace edema, blackened L 2nd toe, L 3rd finger wrapped Dialysis Access: [facility]  Additional Objective Labs: Basic Metabolic Panel: Recent Labs  Lab 09/16/21 0520 09/19/21 0915 09/21/21 0521  NA 129* 132* 128*  K 4.0 4.5 4.0  CL 91* 93* 91*  CO2 25 26 26   GLUCOSE 89 88 202*  BUN 31* 38* 36*  CREATININE 6.95* 7.34* 6.80*  CALCIUM 8.2* 8.2* 7.9*  PHOS  --  4.8*  --    Liver Function Tests: Recent Labs  Lab 09/19/21 0915  ALBUMIN 2.0*   CBC: Recent Labs  Lab 09/16/21 0520 09/17/21 0530 09/18/21 0500 09/19/21 0915 09/21/21 0521  WBC 28.9* 29.9* 25.3* 23.6* 36.4*  HGB 8.5* 8.7* 8.7* 8.4* 8.0*  HCT 28.7* 29.0* 29.5* 29.1* 27.6*  MCV 92.9 92.9 94.9 95.4 94.2  PLT 203 214 223 234 266   Medications:  sodium chloride Stopped (09/13/21 0341)   ceFEPime (MAXIPIME) IV Stopped (09/19/21 1924)    apixaban  5 mg Oral BID   aspirin EC  81 mg Oral Daily   atorvastatin  10 mg Oral QHS   calcitRIOL  1 mcg Oral Q T,Th,Sa-HD   calcium carbonate  1,500 mg Oral TID with meals   cinacalcet  30 mg Oral Q T,Th,Sa-HD   darbepoetin (ARANESP) injection - DIALYSIS  200 mcg Intravenous Q Thu-HD   diclofenac Sodium  2 g Topical QID   feeding supplement  1 Container Oral TID BM   gabapentin  100 mg Oral Q12H   gabapentin  200 mg Oral Q T,Th,Sa-HD   Gerhardt's butt cream   Topical BID   lactulose  30 g Oral TID   metoCLOPramide (REGLAN) injection  10 mg Intravenous  Q6H   multivitamin  1 tablet Oral QHS   pantoprazole  40 mg Oral BID   polyethylene glycol  17 g Oral Daily   senna-docusate  1 tablet Oral BID   sodium chloride flush  10-40 mL Intracatheter Q12H   sodium chloride flush  3 mL Intravenous Q12H    Dialysis Orders: [name] TTS 4h 34min, BFR 400, EDW 151 kg, 1K/2.5Ca bath, TDC - 4/29 > HBsAg neg w/ Abs < 10 Hep 2,600 load and 1,500 hourly  - mircera 200ug q2 due 09/10/21 - calcitriol 1 ug tiw - sensipar 30 mg tiw   Assessment/Plan: Septic shock: On admit, requiring short-term pressors, now off. WBC higher today (36.4), Blood Cx drawn this AM. 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 Splenic Infarct/abdominal pain: Initially on heparin drip -> now Eliquis BID. ESRD: Continue HD per usual TTS schedule - HD now, 3L UFG. HTN/volume: Variable BP, weights much higher here although likely inaccurate if weighing in bed. Will try to maximize UF with next  HD. Anemia of ESRD: Hgb 8, low/stable. Continue Aranesp 200 mcg q Thursday. Secondary Hyperparathyroidism: CorrCa/Phos ok, on Tums as binder. Continue VDRA + sensipar. Chronic hypoxic respiratory failure: Managed by primary T2DM Obesity  [name], PA-C 09/21/2021, 9:38 AM  [facility]

## 2021-09-22 DIAGNOSIS — D735 Infarction of spleen: Secondary | ICD-10-CM | POA: Diagnosis not present

## 2021-09-22 LAB — GLUCOSE, CAPILLARY: Glucose-Capillary: 170 mg/dL — ABNORMAL HIGH (ref 70–99)

## 2021-09-22 NOTE — Progress Notes (Signed)
[facility] Progress Note   Subjective:  Seen in room - no overnight issues. Dialyzed yesterday without issue. Blood Cx NGTD. Per notes, looks like ortho planning L 3rd finger amputation tomorrow.  Objective Vitals:   09/21/21 2058 09/21/21 2255 09/22/21 0644 09/22/21 0814  BP:  (!) 129/58 (!) 144/67 (!) 141/68  Pulse:  95 97 94  Resp:  20 14   Temp: 99.3 F (37.4 C) 99.3 F (37.4 C) 99.3 F (37.4 C) 98.6 F (37 C)  TempSrc: Oral Oral Oral Oral  SpO2: 97% 95% 96% 97%  Weight:   (!) 156.6 kg   Height:       Physical Exam General: Obese woman, NAD. Nasal O2 in place. Heart: RRR; no murmur Lungs: CTA anteriorly Abdomen: soft Extremities: R AKA; LLE with trace edema, blackened L 2nd toe, L 3rd finger wrapped Dialysis Access: [facility]  Additional Objective Labs: Basic Metabolic Panel: Recent Labs  Lab 09/16/21 0520 09/19/21 0915 09/21/21 0521  NA 129* 132* 128*  K 4.0 4.5 4.0  CL 91* 93* 91*  CO2 25 26 26   GLUCOSE 89 88 202*  BUN 31* 38* 36*  CREATININE 6.95* 7.34* 6.80*  CALCIUM 8.2* 8.2* 7.9*  PHOS  --  4.8*  --    Liver Function Tests: Recent Labs  Lab 09/19/21 0915  ALBUMIN 2.0*   CBC: Recent Labs  Lab 09/16/21 0520 09/17/21 0530 09/18/21 0500 09/19/21 0915 09/21/21 0521  WBC 28.9* 29.9* 25.3* 23.6* 36.4*  HGB 8.5* 8.7* 8.7* 8.4* 8.0*  HCT 28.7* 29.0* 29.5* 29.1* 27.6*  MCV 92.9 92.9 94.9 95.4 94.2  PLT 203 214 223 234 266   Blood Culture    Component Value Date/Time   SDES BLOOD SITE NOT SPECIFIED 09/21/2021 0836   SPECREQUEST  09/21/2021 0836    BOTTLES DRAWN AEROBIC AND ANAEROBIC Blood Culture adequate volume   CULT  09/21/2021 0836    NO GROWTH < 24 HOURS Performed at [facility], [place] [address], [place], [place] [address]    REPTSTATUS PENDING 09/21/2021 3299   Medications:  sodium chloride Stopped (09/13/21 0341)   ceFEPime (MAXIPIME) IV 2 g (09/21/21 1647)    apixaban  5 mg Oral BID   aspirin EC  81 mg Oral  Daily   atorvastatin  10 mg Oral QHS   calcitRIOL  1 mcg Oral Q T,Th,Sa-HD   calcium carbonate  1,500 mg Oral TID with meals   cinacalcet  30 mg Oral Q T,Th,Sa-HD   darbepoetin (ARANESP) injection - DIALYSIS  200 mcg Intravenous Q Thu-HD   diclofenac Sodium  2 g Topical QID   feeding supplement  1 Container Oral TID BM   gabapentin  100 mg Oral Q12H   gabapentin  200 mg Oral Q T,Th,Sa-HD   Gerhardt's butt cream   Topical BID   lactulose  30 g Oral TID   metoCLOPramide (REGLAN) injection  10 mg Intravenous Q6H   multivitamin  1 tablet Oral QHS   pantoprazole  40 mg Oral BID   polyethylene glycol  17 g Oral Daily   senna-docusate  1 tablet Oral BID   sodium chloride flush  10-40 mL Intracatheter Q12H   sodium chloride flush  3 mL Intravenous Q12H    Dialysis Orders: [name] TTS 4h 25min, BFR 400, EDW 151 kg, 1K/2.5Ca bath, TDC - 4/29 > HBsAg neg w/ Abs < 10 Hep 2,600 load and 1,500 hourly  - mircera 200ug q2 due 09/10/21 - calcitriol 1  ug tiw - sensipar 30 mg tiw   Assessment/Plan: Septic shock: On admit, requiring short-term pressors, now off. WBC higher 6/17, Blood Cx drawn - negative so far. 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 Looks like going for 3rd finger amp 6/19. Splenic Infarct/abdominal pain: Initially on heparin drip -> now Eliquis BID. ESRD: Continue HD per usual TTS schedule - next HD 6/20. HTN/volume: Variable BP, weights much higher here although likely inaccurate if weighing in bed. Will try to maximize UF with next HD. Anemia of ESRD: Hgb 8, low/stable. Continue Aranesp 200 mcg q Thursday. Secondary Hyperparathyroidism: CorrCa/Phos ok, on Tums as binder. Continue VDRA + sensipar. Chronic hypoxic respiratory failure: Managed by primary T2DM Obesity    [name], PA-C 09/22/2021, 11:17 AM  [facility]

## 2021-09-22 NOTE — Progress Notes (Signed)
Pt with unchanged finger appearance. Concern that finger could be nidus of infection given ongoing leukocytosis. Will see patient in the AM and will tentatively plan on left middle finger amputation tomorrow.

## 2021-09-22 NOTE — Progress Notes (Signed)
Progress Note  Patient: Courtney Gray URK:[id number] DOB: Feb 19, 1971  DOA: 09/09/2021  DOS: 09/22/2021    Brief hospital course: Jalaila Caradonna is a 51 y.o. 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 She was hypoglycemic, febrile with CT revealing hypoenhancement in the spleen consistent with infarct.   Assessment and Plan:  Distributive shock with lactic acidosis and presumed acute infection:  - Pt has gangrene of left third digit and splenic infarcts which are the likely culprit for presentation. No other definite nidus of infection currently noted.  - Continuing empiric abx (Cefepime with dialysis) for now -White count continues to climb currently 38, repeat cultures pending -concern left finger may be nidus for ongoing infection - Weaned from pressors 6/8.  Ultimately off midodrine with stabilizing blood pressure. - Monitor blood cultures (NGTD initially) - Repeat culture 09/21/21 pending   IDT2DM uncontrolled with hypoglycemia:  - HbA1c 5.7% and recurrently hypoglycemic here requiring D10 at one point (now off). - Encourage po intake. D/w pt and daughter. We removed NGT w/trickle feeds with plans for cor trak which the patient has declined - Hold insulin and follow clinically. Glucagon prn - May need to restart sliding scale in near future pending labs  Splenic infarct:  - No embolic source noted on echo or CT's - Unclear if possibly infectious vs embolic given above - Heparin gtt- transition to eliquis 5 BID - Pain control with oral dilaudid.   Severe PAD s/p right AKA:  - Significant vascular compromise of LLE as well.  - VVS consulted, planning outpatient suture removal 6/13, will see here if still admitted. Recommended monitoring and protected LLE as this would be  at high risk for amputation if wounded. - Continue statin, anticoagulation. Can restart ASA once more stable.  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  - Hand surgery consulted, recommending outpatient follow up to allow for evolutionary changes prior to amputation.  - Discussed with Ortho to have reevaluation given her prolonged hospital stay to reevaluate for possible amputation here versus outpatient, appreciate insight recommendations.Tentative amputation 09/23/2021 - Vascular surgery stated she has multiphasic flow per recent duplex - Pain control: Gabapentin, tylenol, dilaudid  Left second toe cyanosis, cannot rule out early necrosis -Extremely poor vasculature given above, orthopedics has been asked to reevaluate the hand, would also appreciate insight into recommendations for her left second toe. Follow up Podiatry (Dr. Posey Pronto) outpatient after demarcation for definitive care/possible amputation  Acute on chronic hypoxic respiratory failure, suspected OHS, OSA, resolved:  - Continue supplemental O2 to maintain SpO2 90-95% to avoid risk of hypercarbia. Continue BiPAP qHS. On abx and anticoagulation as above already. - Atelectasis and acute on chronic hypoventilation strongly suspected to be contributing.  Continue physical therapy, early ambulation and incentive spirometer - Bronchodilator prn, no wheezing currently -Chronically on 2 L nasal cannula, back to baseline at this point  GERD:  - Augmented PPI to BID, added carafate  Nausea:  - Continue zofran prn and compazine prn refractory symptoms.  ESRD:  - Back on TTS scheduled for HD per nephrology.    Poor IV access: s/p left EJ triple lumen catheter placed by IR 6/7. This is still needed at this time.  Morbid obesity: Body mass index is 57.45 kg/m.  HTN: Has been requiring midodrine.  - May need to reinitiate antihypertensive Tx (norvasc, coreg) if BPs rise after stopping midodrine.  Demand  myocardial ischemia: Mild troponin elevation without angina.  - Defer further work up at this time.   Anemia of ESRD:  - Defer to nephrology whether ESA should be held. No active bleeding, will monitor hgb on anticoagulation.  HLD:  - Continue statin  Constipation: LBM 6/8 - Continue regular bowel regimen.   Stage 2 left and buttocks pressure injuries, POA:  - Offload as able.   Needs to start PT/OT, ordered. TOC consulted for LTC vs. SNF placement  Subjective: No acute issues or events overnight  Objective: Vitals:   09/21/21 1129 09/21/21 1159 09/21/21 2058 09/22/21 0644  BP: 136/72 (!) 146/72  (!) 144/67  Pulse: 96 99  97  Resp: 19 (!) 25  14  Temp: 98.1 F (36.7 C) 97.6 F (36.4 C) 99.3 F (37.4 C) 99.3 F (37.4 C)  TempSrc: Oral Oral Oral Oral  SpO2: 100% 98% 97% 96%  Weight: (!) 153.4 kg   (!) 156.6 kg  Height:       Gen: 51 y.o. female in no distress Pulm: Nonlabored at rest without wheezes or crackles anterolaterally. CV: Regular rate and rhythm. Soft systolic murmur at base without any new murmur, rub, or gallop. UTD JVD, no pitting dependent edema. GI: Abdomen soft, not significantly tender, non-distended, with normoactive bowel sounds.  Ext: Warm, no deformities Skin: No new rashes, lesions or ulcers on visualized skin. Left 3rd finger with foam pad dressing c/d/I without spreading erythema or necrosis or odor at this time. Neuro: Alert and oriented. No focal neurological deficits. Psych: Judgement and insight appear fair. Mood euthymic & affect congruent. Behavior is appropriate.    Data Personally reviewed: CBC: Recent Labs  Lab 09/16/21 0520 09/17/21 0530 09/18/21 0500 09/19/21 0915 09/21/21 0521  WBC 28.9* 29.9* 25.3* 23.6* 36.4*  HGB 8.5* 8.7* 8.7* 8.4* 8.0*  HCT 28.7* 29.0* 29.5* 29.1* 27.6*  MCV 92.9 92.9 94.9 95.4 94.2  PLT 203 214 223 234 947    Basic Metabolic Panel: Recent Labs  Lab 09/16/21 0520 09/19/21 0915 09/21/21 0521  NA  129* 132* 128*  K 4.0 4.5 4.0  CL 91* 93* 91*  CO2 25 26 26   GLUCOSE 89 88 202*  BUN 31* 38* 36*  CREATININE 6.95* 7.34* 6.80*  CALCIUM 8.2* 8.2* 7.9*  PHOS  --  4.8*  --     GFR: Estimated Creatinine Clearance: 15 mL/min (A) (by C-G formula based on SCr of 6.8 mg/dL (H)). Liver Function Tests: Recent Labs  Lab 09/19/21 0915  ALBUMIN 2.0*    No results for input(s): "LIPASE", "AMYLASE" in the last 168 hours.  No results for input(s): "AMMONIA" in the last 168 hours. Coagulation Profile: No results for input(s): "INR", "PROTIME" in the last 168 hours.  Cardiac Enzymes: No results for input(s): "CKTOTAL", "CKMB", "CKMBINDEX", "TROPONINI" in the last 168 hours. BNP (last 3 results) No results for input(s): "PROBNP" in the last 8760 hours. HbA1C: No results for input(s): "HGBA1C" in the last 72 hours. CBG: Recent Labs  Lab 09/20/21 1656 09/21/21 1200 09/21/21 1632 09/21/21 2230 09/22/21 0748  GLUCAP 215* 145* 162* 245* 170*    Lipid Profile: No results for input(s): "CHOL", "HDL", "LDLCALC", "TRIG", "CHOLHDL", "LDLDIRECT" in the last 72 hours. Thyroid Function Tests: No results for input(s): "TSH", "T4TOTAL", "FREET4", "T3FREE", "THYROIDAB" in the last 72 hours. Anemia Panel: No results for input(s): "  VITAMINB12", "FOLATE", "FERRITIN", "TIBC", "IRON", "RETICCTPCT" in the last 72 hours. Urine analysis: No results found for: "COLORURINE", "APPEARANCEUR", "LABSPEC", "PHURINE", "GLUCOSEU", "HGBUR", "BILIRUBINUR", "KETONESUR", "PROTEINUR", "UROBILINOGEN", "NITRITE", "LEUKOCYTESUR" No results found for this or any previous visit (from the past 240 hour(s)).    No results found.   Family Communication: Daughter at bedside 6/11  Disposition: Status is: Inpatient Remains inpatient appropriate because: Remains critically ill Planned Discharge Destination:  TBD  - SNF vs. LTC.  Little Ishikawa, MD 09/22/2021 8:06 AM Page by [email or web address]

## 2021-09-23 ENCOUNTER — Inpatient Hospital Stay (HOSPITAL_COMMUNITY): Payer: 59 | Admitting: Anesthesiology

## 2021-09-23 ENCOUNTER — Other Ambulatory Visit: Payer: Self-pay

## 2021-09-23 ENCOUNTER — Encounter (HOSPITAL_COMMUNITY)
Admission: EM | Disposition: A | Discharge status: Skilled Nursing Facility | Payer: Self-pay | Source: Home / Self Care | Attending: Internal Medicine

## 2021-09-23 ENCOUNTER — Encounter (HOSPITAL_COMMUNITY): Payer: Self-pay | Admitting: Internal Medicine

## 2021-09-23 DIAGNOSIS — E1152 Type 2 diabetes mellitus with diabetic peripheral angiopathy with gangrene: Secondary | ICD-10-CM

## 2021-09-23 DIAGNOSIS — Z794 Long term (current) use of insulin: Secondary | ICD-10-CM | POA: Diagnosis not present

## 2021-09-23 DIAGNOSIS — Z7984 Long term (current) use of oral hypoglycemic drugs: Secondary | ICD-10-CM

## 2021-09-23 DIAGNOSIS — D735 Infarction of spleen: Secondary | ICD-10-CM | POA: Diagnosis not present

## 2021-09-23 DIAGNOSIS — I2510 Atherosclerotic heart disease of native coronary artery without angina pectoris: Secondary | ICD-10-CM

## 2021-09-23 DIAGNOSIS — M87045 Idiopathic aseptic necrosis of left finger(s): Secondary | ICD-10-CM

## 2021-09-23 HISTORY — PX: AMPUTATION: SHX166

## 2021-09-23 LAB — CBC
HCT: 27.4 % — ABNORMAL LOW (ref 36.0–46.0)
Hemoglobin: 8 g/dL — ABNORMAL LOW (ref 12.0–15.0)
MCH: 28.2 pg (ref 26.0–34.0)
MCHC: 29.2 g/dL — ABNORMAL LOW (ref 30.0–36.0)
MCV: 96.5 fL (ref 80.0–100.0)
Platelets: 301 10*3/uL (ref 150–400)
RBC: 2.84 MIL/uL — ABNORMAL LOW (ref 3.87–5.11)
RDW: 20.7 % — ABNORMAL HIGH (ref 11.5–15.5)
WBC: 36.2 10*3/uL — ABNORMAL HIGH (ref 4.0–10.5)
nRBC: 0.2 % (ref 0.0–0.2)

## 2021-09-23 LAB — BASIC METABOLIC PANEL
Anion gap: 13 (ref 5–15)
BUN: 40 mg/dL — ABNORMAL HIGH (ref 6–20)
CO2: 26 mmol/L (ref 22–32)
Calcium: 8.1 mg/dL — ABNORMAL LOW (ref 8.9–10.3)
Chloride: 92 mmol/L — ABNORMAL LOW (ref 98–111)
Creatinine, Ser: 6.53 mg/dL — ABNORMAL HIGH (ref 0.44–1.00)
GFR, Estimated: 7 mL/min — ABNORMAL LOW (ref >60)
Glucose, Bld: 173 mg/dL — ABNORMAL HIGH (ref 70–99)
Potassium: 4 mmol/L (ref 3.5–5.1)
Sodium: 131 mmol/L — ABNORMAL LOW (ref 135–145)

## 2021-09-23 LAB — GLUCOSE, CAPILLARY
Glucose-Capillary: 130 mg/dL — ABNORMAL HIGH (ref 70–99)
Glucose-Capillary: 136 mg/dL — ABNORMAL HIGH (ref 70–99)

## 2021-09-23 LAB — SURGICAL PCR SCREEN
MRSA, PCR: NEGATIVE
Staphylococcus aureus: NEGATIVE

## 2021-09-23 SURGERY — AMPUTATION DIGIT
Anesthesia: Monitor Anesthesia Care | Site: Arm Lower | Laterality: Left

## 2021-09-23 MED ORDER — ONDANSETRON HCL 4 MG/2ML IJ SOLN
INTRAMUSCULAR | Status: AC
  Filled 2021-09-23: qty 4

## 2021-09-23 MED ORDER — SODIUM CHLORIDE 0.9 % IV SOLN: INTRAVENOUS | Status: DC

## 2021-09-23 MED ORDER — BUPIVACAINE HCL (PF) 0.25 % IJ SOLN
INTRAMUSCULAR | Status: AC
  Filled 2021-09-23: qty 30

## 2021-09-23 MED ORDER — LIDOCAINE HCL 2 % IJ SOLN
INTRAMUSCULAR | Status: AC
Start: 2021-09-23 — End: ?
  Filled 2021-09-23: qty 20

## 2021-09-23 MED ORDER — BACITRACIN ZINC 500 UNIT/GM EX OINT
TOPICAL_OINTMENT | CUTANEOUS | Status: AC
  Filled 2021-09-23: qty 28.35

## 2021-09-23 MED ORDER — ACETAMINOPHEN 500 MG PO TABS
1000.0000 mg | ORAL_TABLET | Freq: Once | ORAL | Status: AC
  Administered 2021-09-23: 1000 mg via ORAL
  Filled 2021-09-23: qty 2

## 2021-09-23 MED ORDER — MIDAZOLAM HCL 2 MG/2ML IJ SOLN
INTRAMUSCULAR | Status: DC | PRN
  Administered 2021-09-23: 2 mg via INTRAVENOUS

## 2021-09-23 MED ORDER — AMISULPRIDE (ANTIEMETIC) 5 MG/2ML IV SOLN: 10.0000 mg | Freq: Once | INTRAVENOUS | Status: DC | PRN

## 2021-09-23 MED ORDER — MIDAZOLAM HCL 2 MG/2ML IJ SOLN
INTRAMUSCULAR | Status: AC
  Filled 2021-09-23: qty 2

## 2021-09-23 MED ORDER — DEXMEDETOMIDINE (PRECEDEX) IN NS 20 MCG/5ML (4 MCG/ML) IV SYRINGE
PREFILLED_SYRINGE | INTRAVENOUS | Status: DC | PRN
  Administered 2021-09-23: 20 ug via INTRAVENOUS

## 2021-09-23 MED ORDER — ACETAMINOPHEN 325 MG PO TABS: 325.0000 mg | ORAL_TABLET | ORAL | Status: DC | PRN

## 2021-09-23 MED ORDER — PHENYLEPHRINE HCL (PRESSORS) 10 MG/ML IV SOLN
INTRAVENOUS | Status: DC | PRN
  Administered 2021-09-23: 80 ug via INTRAVENOUS

## 2021-09-23 MED ORDER — ORAL CARE MOUTH RINSE: 15.0000 mL | Freq: Once | OROMUCOSAL | Status: DC

## 2021-09-23 MED ORDER — BUPIVACAINE HCL (PF) 0.25 % IJ SOLN
INTRAMUSCULAR | Status: DC | PRN
  Administered 2021-09-23: 18 mL

## 2021-09-23 MED ORDER — FENTANYL CITRATE (PF) 250 MCG/5ML IJ SOLN
INTRAMUSCULAR | Status: DC | PRN
  Administered 2021-09-23 (×2): 25 ug via INTRAVENOUS

## 2021-09-23 MED ORDER — PROPOFOL 10 MG/ML IV BOLUS
INTRAVENOUS | Status: AC
Start: 2021-09-23 — End: ?
  Filled 2021-09-23: qty 20

## 2021-09-23 MED ORDER — FENTANYL CITRATE (PF) 100 MCG/2ML IJ SOLN
25.0000 ug | INTRAMUSCULAR | Status: DC | PRN
  Administered 2021-09-23: 50 ug via INTRAVENOUS

## 2021-09-23 MED ORDER — ACETAMINOPHEN 160 MG/5ML PO SOLN: 325.0000 mg | ORAL | Status: DC | PRN

## 2021-09-23 MED ORDER — LIDOCAINE HCL 2 % IJ SOLN
INTRAMUSCULAR | Status: DC | PRN
  Administered 2021-09-23: 10 mL

## 2021-09-23 MED ORDER — FENTANYL CITRATE (PF) 100 MCG/2ML IJ SOLN
INTRAMUSCULAR | Status: AC
  Filled 2021-09-23: qty 2

## 2021-09-23 MED ORDER — FENTANYL CITRATE (PF) 250 MCG/5ML IJ SOLN
INTRAMUSCULAR | Status: AC
  Filled 2021-09-23: qty 5

## 2021-09-23 MED ORDER — OXYCODONE HCL 5 MG PO TABS: 5.0000 mg | ORAL_TABLET | Freq: Once | ORAL | Status: DC | PRN

## 2021-09-23 MED ORDER — ACETAMINOPHEN 10 MG/ML IV SOLN: 1000.0000 mg | Freq: Once | INTRAVENOUS | Status: DC | PRN

## 2021-09-23 MED ORDER — CHLORHEXIDINE GLUCONATE 0.12 % MT SOLN
OROMUCOSAL | Status: AC
  Filled 2021-09-23: qty 15

## 2021-09-23 MED ORDER — CHLORHEXIDINE GLUCONATE 0.12 % MT SOLN: 15.0000 mL | Freq: Once | OROMUCOSAL | Status: DC

## 2021-09-23 MED ORDER — OXYCODONE HCL 5 MG/5ML PO SOLN: 5.0000 mg | Freq: Once | ORAL | Status: DC | PRN

## 2021-09-23 MED ORDER — PROPOFOL 10 MG/ML IV BOLUS
INTRAVENOUS | Status: DC | PRN
  Administered 2021-09-23 (×2): 20 mg via INTRAVENOUS
  Administered 2021-09-23: 10 mg via INTRAVENOUS
  Administered 2021-09-23: 20 mg via INTRAVENOUS
  Administered 2021-09-23: 30 mg via INTRAVENOUS
  Administered 2021-09-23: 20 mg via INTRAVENOUS

## 2021-09-23 SURGICAL SUPPLY — 48 items
BAG COUNTER SPONGE SURGICOUNT (BAG) ×2 IMPLANT
BNDG COHESIVE 1X5 TAN STRL LF (GAUZE/BANDAGES/DRESSINGS) ×2 IMPLANT
BNDG CONFORM 2 STRL LF (GAUZE/BANDAGES/DRESSINGS) IMPLANT
BNDG CONFORM 3 STRL LF (GAUZE/BANDAGES/DRESSINGS) ×1 IMPLANT
BNDG ELASTIC 2X5.8 VLCR STR LF (GAUZE/BANDAGES/DRESSINGS) ×2 IMPLANT
BNDG ELASTIC 3X5.8 VLCR STR LF (GAUZE/BANDAGES/DRESSINGS) IMPLANT
BNDG ELASTIC 4X5.8 VLCR STR LF (GAUZE/BANDAGES/DRESSINGS) IMPLANT
BNDG GAUZE ELAST 4 BULKY (GAUZE/BANDAGES/DRESSINGS) ×2 IMPLANT
CONT SPEC 4OZ CLIKSEAL STRL BL (MISCELLANEOUS) ×1 IMPLANT
CORD BIPOLAR FORCEPS 12FT (ELECTRODE) ×2 IMPLANT
COVER SURGICAL LIGHT HANDLE (MISCELLANEOUS) ×2 IMPLANT
CUFF TOURN SGL QUICK 18X4 (TOURNIQUET CUFF) ×2 IMPLANT
CUFF TOURN SGL QUICK 24 (TOURNIQUET CUFF)
CUFF TRNQT CYL 24X4X16.5-23 (TOURNIQUET CUFF) IMPLANT
DRAPE SURG 17X23 STRL (DRAPES) ×2 IMPLANT
DRSG ADAPTIC 3X8 NADH LF (GAUZE/BANDAGES/DRESSINGS) ×2 IMPLANT
DRSG EMULSION OIL 3X3 NADH (GAUZE/BANDAGES/DRESSINGS) ×1 IMPLANT
DRSG XEROFORM 1X8 (GAUZE/BANDAGES/DRESSINGS) ×1 IMPLANT
GAUZE SPONGE 2X2 8PLY STRL LF (GAUZE/BANDAGES/DRESSINGS) IMPLANT
GAUZE SPONGE 4X4 12PLY STRL (GAUZE/BANDAGES/DRESSINGS) IMPLANT
GAUZE SPONGE 4X4 12PLY STRL LF (GAUZE/BANDAGES/DRESSINGS) ×1 IMPLANT
GLOVE BIOGEL PI IND STRL 8.5 (GLOVE) ×1 IMPLANT
GLOVE BIOGEL PI INDICATOR 8.5 (GLOVE) ×1
GLOVE SURG ORTHO 8.0 STRL STRW (GLOVE) ×2 IMPLANT
GOWN STRL REUS W/ TWL LRG LVL3 (GOWN DISPOSABLE) ×2 IMPLANT
GOWN STRL REUS W/ TWL XL LVL3 (GOWN DISPOSABLE) ×1 IMPLANT
GOWN STRL REUS W/TWL LRG LVL3 (GOWN DISPOSABLE) ×2
GOWN STRL REUS W/TWL XL LVL3 (GOWN DISPOSABLE) ×1
KIT BASIN OR (CUSTOM PROCEDURE TRAY) ×2 IMPLANT
KIT TURNOVER KIT B (KITS) ×2 IMPLANT
MANIFOLD NEPTUNE II (INSTRUMENTS) ×2 IMPLANT
NDL HYPO 25GX1X1/2 BEV (NEEDLE) IMPLANT
NEEDLE HYPO 25GX1X1/2 BEV (NEEDLE) IMPLANT
NS IRRIG 1000ML POUR BTL (IV SOLUTION) ×2 IMPLANT
PACK ORTHO EXTREMITY (CUSTOM PROCEDURE TRAY) ×2 IMPLANT
PAD ARMBOARD 7.5X6 YLW CONV (MISCELLANEOUS) ×4 IMPLANT
PAD CAST 4YDX4 CTTN HI CHSV (CAST SUPPLIES) IMPLANT
PADDING CAST COTTON 4X4 STRL (CAST SUPPLIES)
SOAP 2 % CHG 4 OZ (WOUND CARE) ×2 IMPLANT
SPECIMEN JAR SMALL (MISCELLANEOUS) ×2 IMPLANT
SPONGE GAUZE 2X2 STER 10/PKG (GAUZE/BANDAGES/DRESSINGS)
SUT MERSILENE 4 0 P 3 (SUTURE) IMPLANT
SUT PROLENE 4 0 PS 2 18 (SUTURE) IMPLANT
SYR CONTROL 10ML LL (SYRINGE) IMPLANT
TOWEL GREEN STERILE (TOWEL DISPOSABLE) ×2 IMPLANT
TOWEL GREEN STERILE FF (TOWEL DISPOSABLE) ×2 IMPLANT
TUBE CONNECTING 12X1/4 (SUCTIONS) IMPLANT
WATER STERILE IRR 1000ML POUR (IV SOLUTION) ×2 IMPLANT

## 2021-09-23 NOTE — Interval H&P Note (Signed)
History and Physical Interval Note:  09/23/2021 5:05 PM  Courtney Gray  has presented today for surgery, with the diagnosis of Left middle Finger infection.  The various methods of treatment have been discussed with the patient and family. After consideration of risks, benefits and other options for treatment, the patient has consented to  Procedure(s): AMPUTATION MIDDLE FINGER (Left) as a surgical intervention.  The patient's history has been reviewed, patient examined, no change in status, stable for surgery.  I have reviewed the patient's chart and labs.  Questions were answered to the patient's satisfaction.      Ryana Montecalvo

## 2021-09-23 NOTE — Anesthesia Preprocedure Evaluation (Signed)
Anesthesia Evaluation  Patient identified by MRN, date of birth, ID band Patient awake    Reviewed: Allergy & Precautions, NPO status , Patient's Chart, lab work & pertinent test results, reviewed documented beta blocker date and time   Airway Mallampati: II  TM Distance: >3 FB Neck ROM: Full    Dental  (+) Missing, Dental Advisory Given   Pulmonary     + decreased breath sounds      Cardiovascular hypertension, Pt. on medications and Pt. on home beta blockers + CAD, + Past MI and + Peripheral Vascular Disease   Rhythm:Regular Rate:Normal     Neuro/Psych negative neurological ROS  negative psych ROS   GI/Hepatic Neg liver ROS, GERD  Medicated,  Endo/Other  diabetes, Type 2, Oral Hypoglycemic Agents, Insulin Dependent  Renal/GU Renal disease     Musculoskeletal  (+) Arthritis ,   Abdominal (+) + obese,   Peds  Hematology negative hematology ROS (+)   Anesthesia Other Findings   Reproductive/Obstetrics                             Anesthesia Physical Anesthesia Plan  ASA: 3  Anesthesia Plan: MAC   Post-op Pain Management:    Induction: Intravenous  PONV Risk Score and Plan: 3 and Ondansetron, Midazolam and Propofol infusion  Airway Management Planned: Natural Airway and Simple Face Mask  Additional Equipment: None  Intra-op Plan:   Post-operative Plan:   Informed Consent: I have reviewed the patients History and Physical, chart, labs and discussed the procedure including the risks, benefits and alternatives for the proposed anesthesia with the patient or authorized representative who has indicated his/her understanding and acceptance.       Plan Discussed with: CRNA  Anesthesia Plan Comments:         Anesthesia Quick Evaluation

## 2021-09-23 NOTE — TOC Progression Note (Signed)
Transition of Care [facility]) - Progression Note    Patient Details  Name: Rajanae Mantia MRN: [id number] Date of Birth: [birth year]  Transition of Care [facility]) CM/SW [place], [place] Phone Number: 09/23/2021, 4:59 PM  Clinical Narrative:     TOC continues to follow patient. Referral made to [facility] program. CSW awaiting to hear back if patient was accepted into the [facility] program. CSW will continue to follow and assist with patients dc planning needs.  Expected Discharge Plan: Skilled Nursing Facility Barriers to Discharge: Continued Medical Work up  Expected Discharge Plan and Services Expected Discharge Plan: [place] In-house Referral: Clinical Social Work Discharge Planning Services: CM Consult Post Acute Care Choice: Durable Medical Equipment Living arrangements for the past 2 months: Single Family Home                                       Social Determinants of Health (SDOH) Interventions    Readmission Risk Interventions    09/16/2021    4:36 PM 08/28/2021   11:50 AM 07/12/2021   12:38 PM  Readmission Risk Prevention Plan  Transportation Screening Complete Complete Complete  Medication Review [employer])  Complete Complete  PCP or Specialist appointment within 3-5 days of discharge  Complete Complete  HRI or Home Care Consult Complete Complete Complete  SW Recovery Care/Counseling Consult Complete Complete Complete  Palliative Care Screening  Not Applicable Not Applicable  Skilled Nursing Facility Complete Not Applicable Complete

## 2021-09-23 NOTE — Progress Notes (Addendum)
[facility] Progress Note   Subjective: Seen in room. She has no C/Os. No issues reported overnight. Going for finger amputation today. Sister at bedside.    Objective Vitals:   09/22/21 0814 09/22/21 2144 09/23/21 0659 09/23/21 0700  BP: (!) 141/68 (!) 145/71  130/65  Pulse: 94 92  97  Resp:  (!) 23  18  Temp: 98.6 F (37 C) 99.1 F (37.3 C) 97.9 F (36.6 C) 98.1 F (36.7 C)  TempSrc: Oral Oral Oral Oral  SpO2: 97% 96%  98%  Weight:   (!) 158.5 kg   Height:       Physical Exam General: Chronically ill appearing morbidly obese female in NAD Heart:S1,S2 slightly distant (body habitus). No M/R/G  Lungs: CTAB slightly decreased in bases Abdomen: obese, soft, NABS Extremities:R AKA difficult to assess for stump edema as pannus in on top of stump but looks OK. LLE with trace edema Dialysis Access: RIJ TDC drsg intact IV Access: LSC Triple lumen SVL   Additional Objective Labs: Basic Metabolic Panel: Recent Labs  Lab 09/19/21 0915 09/21/21 0521 09/23/21 0712  NA 132* 128* 131*  K 4.5 4.0 4.0  CL 93* 91* 92*  CO2 26 26 26   GLUCOSE 88 202* 173*  BUN 38* 36* 40*  CREATININE 7.34* 6.80* 6.53*  CALCIUM 8.2* 7.9* 8.1*  PHOS 4.8*  --   --    Liver Function Tests: Recent Labs  Lab 09/19/21 0915  ALBUMIN 2.0*   No results for input(s): "LIPASE", "AMYLASE" in the last 168 hours. CBC: Recent Labs  Lab 09/17/21 0530 09/18/21 0500 09/19/21 0915 09/21/21 0521 09/23/21 0712  WBC 29.9* 25.3* 23.6* 36.4* 36.2*  HGB 8.7* 8.7* 8.4* 8.0* 8.0*  HCT 29.0* 29.5* 29.1* 27.6* 27.4*  MCV 92.9 94.9 95.4 94.2 96.5  PLT 214 223 234 266 301   Blood Culture    Component Value Date/Time   SDES BLOOD SITE NOT SPECIFIED 09/21/2021 0836   SPECREQUEST  09/21/2021 0836    BOTTLES DRAWN AEROBIC AND ANAEROBIC Blood Culture adequate volume   CULT  09/21/2021 0836    NO GROWTH 2 DAYS Performed at [facility], [place] [address], [place], [place] [address]     REPTSTATUS PENDING 09/21/2021 5638    Cardiac Enzymes: No results for input(s): "CKTOTAL", "CKMB", "CKMBINDEX", "TROPONINI" in the last 168 hours. CBG: Recent Labs  Lab 09/20/21 1656 09/21/21 1200 09/21/21 1632 09/21/21 2230 09/22/21 0748  GLUCAP 215* 145* 162* 245* 170*   Iron Studies: No results for input(s): "IRON", "TIBC", "TRANSFERRIN", "FERRITIN" in the last 72 hours. @lablastinr3 @ Studies/Results: No results found. Medications:  sodium chloride Stopped (09/13/21 0341)   ceFEPime (MAXIPIME) IV 2 g (09/21/21 1647)    apixaban  5 mg Oral BID   aspirin EC  81 mg Oral Daily   atorvastatin  10 mg Oral QHS   calcitRIOL  1 mcg Oral Q T,Th,Sa-HD   calcium carbonate  1,500 mg Oral TID with meals   cinacalcet  30 mg Oral Q T,Th,Sa-HD   darbepoetin (ARANESP) injection - DIALYSIS  200 mcg Intravenous Q Thu-HD   diclofenac Sodium  2 g Topical QID   feeding supplement  1 Container Oral TID BM   gabapentin  100 mg Oral Q12H   gabapentin  200 mg Oral Q T,Th,Sa-HD   Gerhardt's butt cream   Topical BID   lactulose  30 g Oral TID   metoCLOPramide (REGLAN) injection  10 mg Intravenous Q6H   multivitamin  1 tablet Oral QHS   pantoprazole  40 mg Oral BID   polyethylene glycol  17 g Oral Daily   senna-docusate  1 tablet Oral BID   sodium chloride flush  10-40 mL Intracatheter Q12H   sodium chloride flush  3 mL Intravenous Q12H     Dialysis Orders: [name] TTS 4h 54min, BFR 400, EDW 151 kg, 1K/2.5Ca bath, TDC - 4/29 > HBsAg neg w/ Abs < 10 Hep 2,600 load and 1,500 hourly-Total dose 9275 units per treatment.  - mircera 200ug q2 due 09/10/21 - calcitriol 1 ug tiw - sensipar 30 mg tiw   Assessment/Plan: Septic shock: On admit, requiring short-term pressors, now off. WBC continues to climb, 36.2 today. [facility] 09/21/2021 NGTD X 2 days. 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 NPO for finger  amputation later today.  Splenic Infarct/abdominal pain: Initially on heparin drip -> now Eliquis BID. ESRD: Continue HD per usual TTS schedule - next HD 09/24/2021. Use 2.0 K bath. Usual heparin.  HTN/volume: Variable BP, weights much higher here although likely inaccurate if weighing in bed. Will try to maximize UF with next HD. Anemia of ESRD: Hgb 8, low/stable. Continue Aranesp 200 mcg q Thursday. Secondary Hyperparathyroidism: CorrCa/Phos ok, on Tums as binder. Continue VDRA + sensipar. Chronic hypoxic respiratory failure: Managed by primary T2DM Morbid Obesity  Rita H. Brown NP-C 09/23/2021, 9:59 AM  [employer] [phone]   Seen and examined independently.  Agree with note and exam as documented above by physician extender and as noted here.  General adult female in bed in no acute distress HEENT normocephalic atraumatic extraocular movements intact sclera anicteric Neck supple trachea midline Lungs clear to auscultation bilaterally normal work of breathing at rest  Heart S1S2 no rub Abdomen soft nontender nondistended Extremities right AKA left leg no pitting edema; finger is bandaged   Psych normal mood and affect Neuro - alert and oriented x 3 provides hx and follows commands Access RIJ tunn catheter   Sepsis - shock resolved.  abx per rimary team. She is to have a finger amputation today and is NPO for same. Hopeful for improvement of leukocytosis with amputation  ESRD - HD per TTS schedule  Anemia ESRD on aranesp 200 mcg every thursday  Chronic hypoxic resp failure - on 3 liters at home and 4 liters here. stable  PAD - as above for finger amputation   Claudia Desanctis, MD 09/23/2021 11:54 AM

## 2021-09-23 NOTE — Op Note (Signed)
   Date of Surgery: 09/23/2021  INDICATIONS: Patient is a 51 y.o.-year-old female with gangrene of the left middle finger distal to the DIP joint. She's currently admitted for multiple medical issues including PVD with subsequent AKA for critical limb iscehmia, ESRD on HD, acute on chronic respiratory failure, morbid obesite, splenic infarcts and this necrotic finger tip.   Risks, benefits, and alternatives to surgery were again discussed with the patient in the preoperative area. The patient wishes to proceed with surgery.  Informed consent was signed after our discussion.   PREOPERATIVE DIAGNOSIS:  Let middle finger necrosis  POSTOPERATIVE DIAGNOSIS: Same.  PROCEDURE:  Left middle finger amputation through PIP joint   SURGEON: Audria Nine, M.D.  ASSIST:   ANESTHESIA:  Local, MAC  IV FLUIDS AND URINE: See anesthesia.  ESTIMATED BLOOD LOSS: 10 mL.  IMPLANTS: * No implants in log *   DRAINS: None  COMPLICATIONS: None  DESCRIPTION OF PROCEDURE: The patient was met in the preoperative holding area where the surgical site was marked and the consent form was verified.  The patient was then taken to the operating room and an armboard was placed on the stretcher.  All bony prominences were well padded.  A tourniquet was applied to the left forearm.  Monitored sedation was induced.  A formal time-out was performed to confirm that this was the correct patient, surgery, side, and site.  A digital block was performed using 0.25% marcaine and 2% lidocaine. The operative extremity was prepped and draped in the usual and sterile fashion.    Following a second formal timeout, the limb was exsanguinated by gravity and the tourniquet inflated 2 to 50 mmHg.  A fishmouth incision was designed around the PIP joint.  The skin was incised.  Blunt dissection was used to identify the neurovascular bundles volarly.  The extensor apparatus was divided.  The flexor tendons were divided.  The finger was  disarticulated at the PIP joint.  A rongeur was used to remove the condyles and cartilage from the head of the proximal phalanx.  The digital neurovascular bundles were again identified and traction neurectomies performed.  The digital arteries were quite calcified.  The wound was thoroughly irrigated with copious sterile saline.  The tourniquet was deflated.  Hemostasis was achieved with bipolar cautery.  The wound was closed with 4-0 Vicryl repeat sutures in a simple fashion.  The wound was dressed with Xeroform, bacitracin, 4 x 4's, and an Ace wrap.  The patient was then reversed from sedation and taken to the PACU in stable condition.  All counts were correct x2 the end of the procedure.  POSTOPERATIVE PLAN:  She will remain admitted given her significant medical issues. I will continue to follow her and monitor her surgical site.   Audria Nine, MD 8:54 PM

## 2021-09-23 NOTE — Transfer of Care (Signed)
Immediate Anesthesia Transfer of Care Note  Patient: Courtney Gray  Procedure(s) Performed: AMPUTATION MIDDLE FINGER (Left: Arm Lower)  Patient Location: PACU  Anesthesia Type:MAC  Level of Consciousness: awake, alert  and oriented  Airway & Oxygen Therapy: Patient Spontanous Breathing and Patient connected to nasal cannula oxygen  Post-op Assessment: Report given to RN, Post -op Vital signs reviewed and stable and Patient moving all extremities  Post vital signs: Reviewed and stable  Last Vitals:  Vitals Value Taken Time  BP 103/46 09/23/21 2102  Temp    Pulse 96 09/23/21 2104  Resp 17 09/23/21 2105  SpO2 99 % 09/23/21 2104  Vitals shown include unvalidated device data.  Last Pain:  Vitals:   09/23/21 1838  TempSrc:   PainSc: 4       Patients Stated Pain Goal:  (declines to state - laugh) (28/41/32 4401)  Complications: No notable events documented.

## 2021-09-23 NOTE — Progress Notes (Signed)
   Subjective:  Resting comfortably.  Pain well controlled.   Objective:   VITALS:   Vitals:   09/22/21 0814 09/22/21 2144 09/23/21 0659 09/23/21 0700  BP: (!) 141/68 (!) 145/71  130/65  Pulse: 94 92  97  Resp:  (!) 23  18  Temp: 98.6 F (37 C) 99.1 F (37.3 C) 97.9 F (36.6 C) 98.1 F (36.7 C)  TempSrc: Oral Oral Oral Oral  SpO2: 97% 96%  98%  Weight:   (!) 158.5 kg   Height:        Gen: NAD, resting comfortably Pulm: Normal WOB on RA CV: BUE warm and well perfused L hand: Necrosis of pad of left middle finger distal phalanx, mild discoloration of skin of middle phalanx but with no TTP proximal to mid aspect of middle phalanx    Lab Results  Component Value Date   WBC 36.2 (H) 09/23/2021   HGB 8.0 (L) 09/23/2021   HCT 27.4 (L) 09/23/2021   MCV 96.5 09/23/2021   PLT 301 09/23/2021     Assessment/Plan:  51 yo LHD F w/ necrosis of left middle finger tip.  Pt w/ severe leukocytosis.  There is some concern that middle finger could be nidus of infection responsible for lab abnormalities.    Will plan for amputation of the left middle finger today We reviewed the risks of surgery including bleeding, infection, damage to blood vessels and nerves, incomplete symptom relief, need for additional procedures OR later this evening  Sherilyn Cooter, MD 09/23/2021, 1:57 PM [phone]

## 2021-09-23 NOTE — Progress Notes (Signed)
Progress Note  Patient: Courtney Gray TDV:[id number] DOB: [birth year]  DOA: 09/09/2021  DOS: 09/23/2021    Brief hospital course: Courtney Gray is a 51 y.o. 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 She was hypoglycemic, febrile with CT revealing hypoenhancement in the spleen consistent with infarct.   Assessment and Plan:  Distributive shock with lactic acidosis and presumed acute infection:  - Pt has gangrene of left third digit and splenic infarcts which are the likely culprit for presentation. No other definite nidus of infection currently noted.  - Continuing empiric abx (Cefepime with dialysis) for now -White count continues to climb currently 38 -Left middle finger amputation planned 09/23/21 pending ortho schedule - Weaned from pressors 6/8.  Ultimately off midodrine with stabilizing blood pressure. 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  - Hand surgery consulted, recommending outpatient follow up to allow for evolutionary changes prior to amputation.  - Discussed with Ortho to have reevaluation given her prolonged hospital stay to reevaluate for possible amputation here versus outpatient, appreciate insight recommendations. - Tentative amputation 09/23/2021 - Vascular surgery stated she has multiphasic flow per recent duplex - Pain currently well controlled  IDT2DM uncontrolled with hypoglycemia:  - HbA1c 5.7% and recurrently hypoglycemic here requiring D10 at one point (now off). - Hold insulin and follow clinically. Glucagon prn - Glucose well controlled on diabetic diet in-house  Splenic infarct:  - No embolic source noted on echo or CT's - Unclear if possibly  infectious vs embolic given above - Heparin gtt- transition to eliquis 5 BID - Pain control with oral dilaudid.   Severe PAD s/p right AKA:  - Significant vascular compromise of LLE as well.  - VVS consulted, planning outpatient suture removal 6/13, will see here if still admitted. Recommended monitoring and protected LLE as this would be at high risk for amputation if wounded. - Continue statin, anticoagulation. Can restart ASA once more stable.  Left second toe cyanosis, cannot rule out early necrosis -Extremely poor vasculature given above, orthopedics has been asked to reevaluate the hand, would also appreciate insight into recommendations for her left second toe. Follow up Podiatry (Dr. Posey Pronto) outpatient after demarcation for definitive care/possible amputation  Acute on chronic hypoxic respiratory failure, suspected OHS, OSA, resolved:  - Continue supplemental O2 to maintain SpO2 90-95% to avoid risk of hypercarbia. Continue BiPAP qHS. On abx and anticoagulation as above already. - Atelectasis and acute on chronic hypoventilation strongly suspected to be contributing.  Continue physical therapy, early ambulation and incentive spirometer - Bronchodilator prn, no wheezing currently -Chronically on 2 L nasal cannula, back to baseline at this point  GERD:  - Augmented PPI to BID, added carafate  Nausea:  - Continue zofran prn and compazine prn refractory symptoms.  ESRD:  - Back on TTS scheduled for HD per nephrology.    Poor IV access: s/p left EJ triple lumen catheter placed by IR 6/7. This is still needed at this time.  Morbid obesity: Body mass index is 58.15 kg/m.   HTN: Previously requiring midodrine.  -Previously on amlodipine and carvedilol, currently well controlled  -May resume at low-dose prior to discharge  Demand myocardial ischemia: Mild troponin elevation without  angina.  - Defer further work up at this time.   Anemia of ESRD:  - Defer to nephrology whether  ESA should be held. No active bleeding, will monitor hgb on anticoagulation.  HLD:  - Continue statin  Constipation: LBM 6/8 - Continue regular bowel regimen.   Stage 2 left and buttocks pressure injuries, POA:  - Offload as able.   Needs to start PT/OT, ordered. TOC consulted for LTC vs. SNF placement  Subjective: No acute issues or events overnight  Objective: Vitals:   09/22/21 0644 09/22/21 0814 09/22/21 2144 09/23/21 0659  BP: (!) 144/67 (!) 141/68 (!) 145/71   Pulse: 97 94 92   Resp: 14  (!) 23   Temp: 99.3 F (37.4 C) 98.6 F (37 C) 99.1 F (37.3 C) 97.9 F (36.6 C)  TempSrc: Oral Oral Oral Oral  SpO2: 96% 97% 96%   Weight: (!) 156.6 kg   (!) 158.5 kg  Height:       Gen: 51 y.o. female in no distress Pulm: Nonlabored at rest without wheezes or crackles anterolaterally. CV: Regular rate and rhythm. Soft systolic murmur at base without any new murmur, rub, or gallop. UTD JVD, no pitting dependent edema. GI: Abdomen soft, not significantly tender, non-distended, with normoactive bowel sounds.  Ext: Warm, no deformities Skin: No new rashes, lesions or ulcers on visualized skin. Left 3rd finger with foam pad dressing Left foot second finger with ischemia distal to PIP. Neuro: Alert and oriented. No focal neurological deficits. Psych: Judgement and insight appear fair. Mood euthymic & affect congruent. Behavior is appropriate.    Data Personally reviewed: CBC: Recent Labs  Lab 09/17/21 0530 09/18/21 0500 09/19/21 0915 09/21/21 0521 09/23/21 0712  WBC 29.9* 25.3* 23.6* 36.4* 36.2*  HGB 8.7* 8.7* 8.4* 8.0* 8.0*  HCT 29.0* 29.5* 29.1* 27.6* 27.4*  MCV 92.9 94.9 95.4 94.2 96.5  PLT 214 223 234 266 170    Basic Metabolic Panel: Recent Labs  Lab 09/19/21 0915 09/21/21 0521  NA 132* 128*  K 4.5 4.0  CL 93* 91*  CO2 26 26  GLUCOSE 88 202*  BUN 38* 36*  CREATININE 7.34* 6.80*  CALCIUM 8.2* 7.9*  PHOS 4.8*  --     GFR: Estimated Creatinine Clearance:  15.1 mL/min (A) (by C-G formula based on SCr of 6.8 mg/dL (H)). Liver Function Tests: Recent Labs  Lab 09/19/21 0915  ALBUMIN 2.0*    No results for input(s): "LIPASE", "AMYLASE" in the last 168 hours.  No results for input(s): "AMMONIA" in the last 168 hours. Coagulation Profile: No results for input(s): "INR", "PROTIME" in the last 168 hours.  Cardiac Enzymes: No results for input(s): "CKTOTAL", "CKMB", "CKMBINDEX", "TROPONINI" in the last 168 hours. BNP (last 3 results) No results for input(s): "PROBNP" in the last 8760 hours. HbA1C: No results for input(s): "HGBA1C" in the last 72 hours. CBG: Recent Labs  Lab 09/20/21 1656 09/21/21 1200 09/21/21 1632 09/21/21 2230 09/22/21 0748  GLUCAP 215* 145* 162* 245* 170*    Lipid Profile: No results for input(s): "CHOL", "HDL", "LDLCALC", "TRIG", "CHOLHDL", "LDLDIRECT" in the last 72 hours. Thyroid Function Tests: No results for input(s): "TSH", "T4TOTAL", "FREET4", "T3FREE", "THYROIDAB" in the last 72 hours. Anemia Panel: No results for input(s): "VITAMINB12", "FOLATE", "FERRITIN", "TIBC", "IRON", "RETICCTPCT" in the last 72 hours. Urine analysis: No results found for: "COLORURINE", "APPEARANCEUR", "LABSPEC", "PHURINE", "GLUCOSEU", "HGBUR", "BILIRUBINUR", "KETONESUR", "PROTEINUR", "UROBILINOGEN", "NITRITE", "LEUKOCYTESUR" Recent Results (from the past 240 hour(s))  Culture, blood (Routine X 2) w  Reflex to ID Panel     Status: None (Preliminary result)   Collection Time: 09/21/21  8:28 AM   Specimen: BLOOD  Result Value Ref Range Status   Specimen Description BLOOD SITE NOT SPECIFIED  Final   Special Requests   Final    BOTTLES DRAWN AEROBIC AND ANAEROBIC Blood Culture adequate volume   Culture   Final    NO GROWTH < 24 HOURS Performed at [facility], [address], [place], [place] [address]    Report Status PENDING  Incomplete  Culture, blood (Routine X 2) w Reflex to ID Panel     Status: None (Preliminary  result)   Collection Time: 09/21/21  8:36 AM   Specimen: BLOOD  Result Value Ref Range Status   Specimen Description BLOOD SITE NOT SPECIFIED  Final   Special Requests   Final    BOTTLES DRAWN AEROBIC AND ANAEROBIC Blood Culture adequate volume   Culture   Final    NO GROWTH < 24 HOURS Performed at [facility], [place] [address] [place], [place] [address]    Report Status PENDING  Incomplete      No results found.   Family Communication: Daughter at bedside 6/11  Disposition: Status is: Inpatient Remains inpatient appropriate because: Remains critically ill Planned Discharge Destination:  TBD  - SNF vs. LTC.  Courtney Ishikawa, MD 09/23/2021 8:18 AM Page by [email or web address]

## 2021-09-24 ENCOUNTER — Encounter (HOSPITAL_COMMUNITY): Payer: Self-pay | Admitting: Orthopedic Surgery

## 2021-09-24 DIAGNOSIS — D735 Infarction of spleen: Secondary | ICD-10-CM | POA: Diagnosis not present

## 2021-09-24 LAB — CBC
HCT: 26.4 % — ABNORMAL LOW (ref 36.0–46.0)
Hemoglobin: 7.5 g/dL — ABNORMAL LOW (ref 12.0–15.0)
MCH: 27.5 pg (ref 26.0–34.0)
MCHC: 28.4 g/dL — ABNORMAL LOW (ref 30.0–36.0)
MCV: 96.7 fL (ref 80.0–100.0)
Platelets: 305 10*3/uL (ref 150–400)
RBC: 2.73 MIL/uL — ABNORMAL LOW (ref 3.87–5.11)
RDW: 20.7 % — ABNORMAL HIGH (ref 11.5–15.5)
WBC: 32 10*3/uL — ABNORMAL HIGH (ref 4.0–10.5)
nRBC: 0.1 % (ref 0.0–0.2)

## 2021-09-24 LAB — RENAL FUNCTION PANEL
Albumin: 1.7 g/dL — ABNORMAL LOW (ref 3.5–5.0)
Anion gap: 11 (ref 5–15)
BUN: 52 mg/dL — ABNORMAL HIGH (ref 6–20)
CO2: 25 mmol/L (ref 22–32)
Calcium: 8.2 mg/dL — ABNORMAL LOW (ref 8.9–10.3)
Chloride: 96 mmol/L — ABNORMAL LOW (ref 98–111)
Creatinine, Ser: 7.95 mg/dL — ABNORMAL HIGH (ref 0.44–1.00)
GFR, Estimated: 6 mL/min — ABNORMAL LOW (ref >60)
Glucose, Bld: 141 mg/dL — ABNORMAL HIGH (ref 70–99)
Phosphorus: 4.8 mg/dL — ABNORMAL HIGH (ref 2.5–4.6)
Potassium: 4.1 mmol/L (ref 3.5–5.1)
Sodium: 132 mmol/L — ABNORMAL LOW (ref 135–145)

## 2021-09-24 MED ORDER — HEPARIN SODIUM (PORCINE) 1000 UNIT/ML DIALYSIS
1000.0000 [IU] | INTRAMUSCULAR | Status: DC | PRN
  Filled 2021-09-24: qty 1

## 2021-09-24 MED ORDER — HEPARIN SODIUM (PORCINE) 1000 UNIT/ML DIALYSIS
9500.0000 [IU] | Freq: Once | INTRAMUSCULAR | Status: AC
Start: 2021-09-24 — End: 2021-09-24
  Administered 2021-09-24: 9500 [IU] via INTRAVENOUS_CENTRAL
  Filled 2021-09-24: qty 10

## 2021-09-24 MED ORDER — ALTEPLASE 2 MG IJ SOLR: 2.0000 mg | Freq: Once | INTRAMUSCULAR | Status: DC | PRN

## 2021-09-24 MED ORDER — POLYETHYLENE GLYCOL 3350 17 G PO PACK
17.0000 g | PACK | Freq: Two times a day (BID) | ORAL | Status: DC
  Administered 2021-09-26 – 2021-09-28 (×2): 17 g via ORAL
  Filled 2021-09-24 (×8): qty 1

## 2021-09-24 MED ORDER — ANTICOAGULANT SODIUM CITRATE 4% (200MG/5ML) IV SOLN: 5.0000 mL | Status: DC | PRN

## 2021-09-24 NOTE — Anesthesia Postprocedure Evaluation (Signed)
Anesthesia Post Note  Patient: Shaia Porath  Procedure(s) Performed: AMPUTATION MIDDLE FINGER (Left: Arm Lower)     Patient location during evaluation: PACU Anesthesia Type: MAC Level of consciousness: awake and alert Pain management: pain level controlled Vital Signs Assessment: post-procedure vital signs reviewed and stable Respiratory status: spontaneous breathing, nonlabored ventilation, respiratory function stable and patient connected to nasal cannula oxygen Cardiovascular status: stable and blood pressure returned to baseline Postop Assessment: no apparent nausea or vomiting Anesthetic complications: no   No notable events documented.  Last Vitals:  Vitals:   09/23/21 2200 09/23/21 2205  BP: 110/78 110/78  Pulse: 90 91  Resp: 18 16  Temp:  (!) 36.3 C  SpO2: 95% 93%    Last Pain:  Vitals:   09/24/21 0000  TempSrc:   PainSc: 0-No pain                 Effie Berkshire

## 2021-09-24 NOTE — TOC Progression Note (Addendum)
Transition of Care [facility]) - Progression Note    Patient Details  Name: Courtney Gray MRN: [id number] Date of Birth: September 20, 1970  [facility] CM/SW [place], [place] Phone Number: 09/24/2021, 2:03 PM  Clinical Narrative:     [facility]. [facility] currently reviewing patient. Patients daughter request for CSW to fax out further for LTC SNF placement. CSW faxed out initial referral for possible LTC SNF bed placement further. Current barriers for LTC SNF bed placement age, bariatric bed.CSW will continue to follow and assist with patients dc planning needs.  Expected Discharge Plan: Skilled Nursing Facility Barriers to Discharge: Continued Medical Work up  Expected Discharge Plan and Services Expected Discharge Plan: [place] In-house Referral: Clinical Social Work Discharge Planning Services: CM Consult Post Acute Care Choice: Durable Medical Equipment Living arrangements for the past 2 months: Single Family Home                                       Social Determinants of Health (SDOH) Interventions    Readmission Risk Interventions    09/16/2021    4:36 PM 08/28/2021   11:50 AM 07/12/2021   12:38 PM  Readmission Risk Prevention Plan  Transportation Screening Complete Complete Complete  Medication Review Press photographer)  Complete Complete  PCP or Specialist appointment within 3-5 days of discharge  Complete Complete  HRI or Home Care Consult Complete Complete Complete  SW Recovery Care/Counseling Consult Complete Complete Complete  Palliative Care Screening  Not Applicable Not Applicable  Skilled Nursing Facility Complete Not Applicable Complete

## 2021-09-24 NOTE — Progress Notes (Signed)
Upon entering the room, patient is awake sitting up in bed. Patient is short of breath and her breathing is mildly labored. Patient is on 4L [place] at 92%. Patient is refusing HD. She said she will go later this afternoon or tomorrow morning after she has a bowel movement. She states,  "I would like my body to feel comfortable when I go". I expressed the importance of HD and how it would help her fluid overload, SHoB and labored breathing. She still refused. HD expressed they may not be able to get to her again today and she was okay with that. Avon Gully MD notified.

## 2021-09-24 NOTE — Progress Notes (Signed)
   Subjective:  No acute events overnight.  Resting comfortably.  Pain in hand well controlled.   Objective:   VITALS:   Vitals:   09/24/21 1017 09/24/21 1030 09/24/21 1046 09/24/21 1100  BP: (!) 146/79 (!) 163/83 (!) 155/75 (!) 148/73  Pulse: 92 91 93   Resp:  20  18  Temp:      TempSrc:      SpO2: 94%  95% 95%  Weight:      Height:        Gen: NAD, resting comfortably Pulm: Normal WOB on supplemental O2 by [place] CV: BUE warm and well perfused, normal rate LUE: Dressing clean and dry    Lab Results  Component Value Date   WBC 32.0 (H) 09/24/2021   HGB 7.5 (L) 09/24/2021   HCT 26.4 (L) 09/24/2021   MCV 96.7 09/24/2021   PLT 305 09/24/2021     Assessment/Plan:  51 yo LHD F now POD 1 s/p left middle finger amputation through the PIP joint for dry gangrene with concern for underlying infection.  No purulence noted at surgical site.   Will keep dressing in place for a week or so before starting wound care Will continue to follow   Sherilyn Cooter, MD 09/24/2021, 11:18 AM [phone]

## 2021-09-24 NOTE — Progress Notes (Signed)
OT Cancellation Note  Patient Details Name: Courtney Gray MRN: [id number] DOB: 12/05/1970   Cancelled Treatment:    Reason Eval/Treat Not Completed: Patient declined, no reason specified (Patient declined therapy due to have had HD today and was fatigued) Lodema Hong, [place] 09/24/2021, 2:34 PM

## 2021-09-24 NOTE — Progress Notes (Addendum)
Hemodialysis- Patient signed off treatment with 2 hours and 23 minutes remaining. Stated she would not do whole treatment because she just had surgery upon arrival to unit this am. Requested I hang antibiotics at 1130 because she would be leaving after they were completed. Tells me she is unable to complete treatment due to hand pain and nausea. Denies medication at this time. Wants to receive when returned to her room.  Reported to Dr. Royce Macadamia. All blood was returned. Report called to 6E.

## 2021-09-24 NOTE — Progress Notes (Signed)
Progress Note  Patient: Courtney Gray JIR:[id number] DOB: 1970-07-23  DOA: 09/09/2021  DOS: 09/24/2021    Brief hospital course: Courtney Gray is a 51 y.o. 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 She was hypoglycemic, febrile with CT revealing hypoenhancement in the spleen consistent with infarct.   Assessment and Plan:  Distributive shock with lactic acidosis and presumed acute infection:  - Pt has gangrene of left third digit and splenic infarcts which are the likely culprit for presentation. No other definite nidus of infection currently noted.  - Continuing empiric abx (Cefepime with dialysis) for now -White count continues to climb currently 38 -Left middle finger amputation planned 09/23/21 pending ortho schedule - Weaned from pressors 6/8.  Ultimately off midodrine with stabilizing blood pressure. 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  - Hand surgery consulted, recommending outpatient follow up to allow for evolutionary changes prior to amputation.  - Discussed with Ortho to have reevaluation given her prolonged hospital stay to reevaluate for possible amputation here versus outpatient, appreciate insight recommendations. - Amputation 09/23/2021 tolerated well - Vascular surgery stated she has multiphasic flow per recent duplex - Pain currently well controlled  IDT2DM uncontrolled with hypoglycemia:  - HbA1c 5.7% and recurrently hypoglycemic here requiring D10 at one point (now off). - Hold insulin and follow clinically. Glucagon prn - Glucose well controlled on diabetic diet in-house  Splenic infarct:  - No embolic source noted on echo or CT's - Unclear if  possibly infectious vs embolic given above - Heparin gtt- transition to eliquis 5 BID - Pain control with oral dilaudid.   Severe PAD s/p right AKA:  - Significant vascular compromise of LLE as well.  - VVS consulted, planning outpatient suture removal 6/13, will see here if still admitted. Recommended monitoring and protected LLE as this would be at high risk for amputation if wounded. - Continue statin, anticoagulation. Can restart ASA once more stable.  Left second toe cyanosis, cannot rule out early necrosis -Extremely poor vasculature given above, orthopedics has been asked to reevaluate the hand, would also appreciate insight into recommendations for her left second toe. Follow up Podiatry (Dr. Posey Pronto) outpatient after demarcation for definitive care/possible amputation in the next 1-2 weeks.  Acute on chronic hypoxic respiratory failure, suspected OHS, OSA, resolved:  - Continue supplemental O2 to maintain SpO2 90-95% to avoid risk of hypercarbia. Continue BiPAP qHS. On abx and anticoagulation as above already. - Atelectasis and acute on chronic hypoventilation strongly suspected to be contributing.  Continue physical therapy, early ambulation and incentive spirometer - Bronchodilator prn, no wheezing currently - Chronically on 2 L nasal cannula, back to baseline at this point  GERD:  - Augmented PPI to BID, added carafate  Nausea:  - Continue zofran prn and compazine prn refractory symptoms.  ESRD:  - Back on TTS scheduled for HD per nephrology.    Poor IV access: s/p left EJ triple lumen catheter placed by IR 6/7. This is still needed at this time.  Morbid obesity: Body mass index is 58.15 kg/m.   HTN: Previously requiring midodrine.  -Previously on amlodipine and carvedilol, currently well controlled  -May resume at low-dose prior to discharge  Demand myocardial ischemia: Mild troponin elevation without angina.  - Defer further work up at this time.   Anemia of ESRD:   - Defer to nephrology whether ESA should be held. No active bleeding, will monitor hgb on anticoagulation.  HLD:  - Continue statin  Constipation: LBM 6/8 - Continue regular bowel regimen.   Stage 2 left and buttocks pressure injuries, POA:  - Offload as able.   Needs to start PT/OT, ordered. TOC consulted for LTC vs. SNF placement  Subjective: No acute issues or events overnight  Objective: Vitals:   09/23/21 2135 09/23/21 2150 09/23/21 2200 09/23/21 2205  BP: (!) 99/51 (!) 92/49 110/78 110/78  Pulse: 97 90 90 91  Resp: 15 14 18 16   Temp:  (!) 97.4 F (36.3 C)  (!) 97.4 F (36.3 C)  TempSrc:      SpO2: 93% 96% 95% 93%  Weight:      Height:       Gen: 51 y.o. female in no distress Pulm: Nonlabored at rest without wheezes or crackles anterolaterally. CV: Regular rate and rhythm. Soft systolic murmur at base without any new murmur, rub, or gallop. UTD JVD, no pitting dependent edema. GI: Abdomen soft, not significantly tender, non-distended, with normoactive bowel sounds.  Ext: Warm, no deformities Skin: No new rashes, lesions or ulcers on visualized skin. Left hand bandage clean/dry/intact. Left foot second digit with ischemia distal to PIP. Neuro: Alert and oriented. No focal neurological deficits. Psych: Judgement and insight appear fair. Mood euthymic & affect congruent. Behavior is appropriate.    Data Personally reviewed: CBC: Recent Labs  Lab 09/18/21 0500 09/19/21 0915 09/21/21 0521 09/23/21 0712  WBC 25.3* 23.6* 36.4* 36.2*  HGB 8.7* 8.4* 8.0* 8.0*  HCT 29.5* 29.1* 27.6* 27.4*  MCV 94.9 95.4 94.2 96.5  PLT 223 234 266 973    Basic Metabolic Panel: Recent Labs  Lab 09/19/21 0915 09/21/21 0521 09/23/21 0712  NA 132* 128* 131*  K 4.5 4.0 4.0  CL 93* 91* 92*  CO2 26 26 26   GLUCOSE 88 202* 173*  BUN 38* 36* 40*  CREATININE 7.34* 6.80* 6.53*  CALCIUM 8.2* 7.9* 8.1*  PHOS 4.8*  --   --     GFR: Estimated Creatinine Clearance: 15.7 mL/min (A)  (by C-G formula based on SCr of 6.53 mg/dL (H)). Liver Function Tests: Recent Labs  Lab 09/19/21 0915  ALBUMIN 2.0*    No results for input(s): "LIPASE", "AMYLASE" in the last 168 hours.  No results for input(s): "AMMONIA" in the last 168 hours. Coagulation Profile: No results for input(s): "INR", "PROTIME" in the last 168 hours.  Cardiac Enzymes: No results for input(s): "CKTOTAL", "CKMB", "CKMBINDEX", "TROPONINI" in the last 168 hours. BNP (last 3 results) No results for input(s): "PROBNP" in the last 8760 hours. HbA1C: No results for input(s): "HGBA1C" in the last 72 hours. CBG: Recent Labs  Lab 09/21/21 1632 09/21/21 2230 09/22/21 0748 09/23/21 1836 09/23/21 2101  GLUCAP 162* 245* 170* 130* 136*    Lipid Profile: No results for input(s): "CHOL", "HDL", "LDLCALC", "TRIG", "CHOLHDL", "LDLDIRECT" in the last 72 hours. Thyroid Function Tests: No results for input(s): "TSH", "T4TOTAL", "FREET4", "T3FREE", "THYROIDAB" in the last 72 hours. Anemia Panel: No results for input(s): "VITAMINB12", "FOLATE", "FERRITIN", "TIBC", "IRON", "RETICCTPCT" in the last 72 hours. Urine analysis: No results found for: "COLORURINE", "APPEARANCEUR", "LABSPEC", "PHURINE", "GLUCOSEU", "HGBUR", "BILIRUBINUR", "KETONESUR", "PROTEINUR", "UROBILINOGEN", "NITRITE", "LEUKOCYTESUR" Recent Results (from the past 240 hour(s))  Culture, blood (Routine X 2) w  Reflex to ID Panel     Status: None (Preliminary result)   Collection Time: 09/21/21  8:28 AM   Specimen: BLOOD  Result Value Ref Range Status   Specimen Description BLOOD SITE NOT SPECIFIED  Final   Special Requests   Final    BOTTLES DRAWN AEROBIC AND ANAEROBIC Blood Culture adequate volume   Culture   Final    NO GROWTH 3 DAYS Performed at [facility], [address], [place], [place] [address]    Report Status PENDING  Incomplete  Culture, blood (Routine X 2) w Reflex to ID Panel     Status: None (Preliminary result)   Collection  Time: 09/21/21  8:36 AM   Specimen: BLOOD  Result Value Ref Range Status   Specimen Description BLOOD SITE NOT SPECIFIED  Final   Special Requests   Final    BOTTLES DRAWN AEROBIC AND ANAEROBIC Blood Culture adequate volume   Culture   Final    NO GROWTH 3 DAYS Performed at [facility], [address], [place], [place] [address]    Report Status PENDING  Incomplete  Surgical pcr screen     Status: None   Collection Time: 09/23/21  5:57 PM   Specimen: Nasal Mucosa; Nasal Swab  Result Value Ref Range Status   MRSA, PCR NEGATIVE NEGATIVE Final   Staphylococcus aureus NEGATIVE NEGATIVE Final    Comment: (NOTE) The Xpert SA Assay (FDA approved for NASAL specimens in patients 16 years of age and older), is one component of a comprehensive surveillance program. It is not intended to diagnose infection nor to guide or monitor treatment. Performed at [facility], [place] [address] [place], [place] [address]       No results found.   Family Communication: Daughter at bedside 6/11  Disposition: Status is: Inpatient Remains inpatient appropriate because: Remains critically ill Planned Discharge Destination:  TBD  - SNF vs. LTC.  Little Ishikawa, MD 09/24/2021 8:20 AM Page by [email or web address]

## 2021-09-24 NOTE — Progress Notes (Signed)
Patient refusing HD this morning. She is alert and oriented x4. Nephrology aware.

## 2021-09-24 NOTE — Progress Notes (Signed)
PT Cancellation Note  Patient Details Name: Courtney Gray MRN: [id number] DOB: 11-11-1970   Cancelled Treatment:    Reason Eval/Treat Not Completed: Patient declined, no reason specified, pt politely declining mobility today, requesting tomorrow. Will check back tomorrow as schedule allows to continue with PT POC.    Betsey Holiday [name] 09/24/2021, 2:33 PM

## 2021-09-24 NOTE — Progress Notes (Addendum)
[facility] Progress Note   Subjective: Seen on HD. Resting comfortably without complaints. Lower UF as tolerated.   Objective Vitals:   09/24/21 0929 09/24/21 0941 09/24/21 1006 09/24/21 1017  BP: (!) 105/40 129/79 (!) 149/68 (!) 146/79  Pulse:  92 91 92  Resp:      Temp:      TempSrc:      SpO2:  94%    Weight:      Height:       Physical Exam General: Chronically ill appearing morbidly obese female in NAD Heart:S1,S2 slightly distant (body habitus). No M/R/G  Lungs: CTAB slightly decreased in bases Abdomen: obese, soft, NABS Extremities:R AKA difficult to assess for stump edema as pannus in on top of stump but looks OK. LLE with trace edema. Ace Wrap L hand.  Dialysis Access: RIJ TDC drsg intact IV Access: [facility] Triple lumen SVL   Additional Objective Labs: Basic Metabolic Panel: Recent Labs  Lab 09/19/21 0915 09/21/21 0521 09/23/21 0712 09/24/21 0850  NA 132* 128* 131* 132*  K 4.5 4.0 4.0 4.1  CL 93* 91* 92* 96*  CO2 26 26 26 25   GLUCOSE 88 202* 173* 141*  BUN 38* 36* 40* 52*  CREATININE 7.34* 6.80* 6.53* 7.95*  CALCIUM 8.2* 7.9* 8.1* 8.2*  PHOS 4.8*  --   --  4.8*   Liver Function Tests: Recent Labs  Lab 09/19/21 0915 09/24/21 0850  ALBUMIN 2.0* 1.7*   No results for input(s): "LIPASE", "AMYLASE" in the last 168 hours. CBC: Recent Labs  Lab 09/18/21 0500 09/19/21 0915 09/21/21 0521 09/23/21 0712 09/24/21 0850  WBC 25.3* 23.6* 36.4* 36.2* 32.0*  HGB 8.7* 8.4* 8.0* 8.0* 7.5*  HCT 29.5* 29.1* 27.6* 27.4* 26.4*  MCV 94.9 95.4 94.2 96.5 96.7  PLT 223 234 266 301 305   Blood Culture    Component Value Date/Time   SDES BLOOD SITE NOT SPECIFIED 09/21/2021 0836   SPECREQUEST  09/21/2021 0836    BOTTLES DRAWN AEROBIC AND ANAEROBIC Blood Culture adequate volume   CULT  09/21/2021 0836    NO GROWTH 3 DAYS Performed at [facility], [place] [address] [place], [place] [address]    REPTSTATUS PENDING 09/21/2021 5188    Cardiac  Enzymes: No results for input(s): "CKTOTAL", "CKMB", "CKMBINDEX", "TROPONINI" in the last 168 hours. CBG: Recent Labs  Lab 09/21/21 1632 09/21/21 2230 09/22/21 0748 09/23/21 1836 09/23/21 2101  GLUCAP 162* 245* 170* 130* 136*   Iron Studies: No results for input(s): "IRON", "TIBC", "TRANSFERRIN", "FERRITIN" in the last 72 hours. @lablastinr3 @ Studies/Results: No results found. Medications:  sodium chloride 250 mL (09/23/21 2218)   anticoagulant sodium citrate     ceFEPime (MAXIPIME) IV 2 g (09/21/21 1647)    apixaban  5 mg Oral BID   aspirin EC  81 mg Oral Daily   atorvastatin  10 mg Oral QHS   calcitRIOL  1 mcg Oral Q T,Th,Sa-HD   calcium carbonate  1,500 mg Oral TID with meals   cinacalcet  30 mg Oral Q T,Th,Sa-HD   darbepoetin (ARANESP) injection - DIALYSIS  200 mcg Intravenous Q Thu-HD   diclofenac Sodium  2 g Topical QID   feeding supplement  1 Container Oral TID BM   gabapentin  100 mg Oral Q12H   gabapentin  200 mg Oral Q T,Th,Sa-HD   Gerhardt's butt cream   Topical BID   lactulose  30 g Oral TID   metoCLOPramide (REGLAN) injection  10 mg Intravenous Q6H  multivitamin  1 tablet Oral QHS   pantoprazole  40 mg Oral BID   polyethylene glycol  17 g Oral BID   senna-docusate  1 tablet Oral BID   sodium chloride flush  10-40 mL Intracatheter Q12H   sodium chloride flush  3 mL Intravenous Q12H     Dialysis Orders: [name] TTS 4h 22min, BFR 400, EDW 151 kg, 1K/2.5Ca bath, TDC - 4/29 > HBsAg neg w/ Abs < 10 Hep 2,600 load and 1,500 hourly-Total dose 9275 units per treatment.  - mircera 200ug q2 due 09/10/21 - calcitriol 1 ug tiw - sensipar 30 mg tiw   Assessment/Plan: Septic shock: On admit, requiring short-term pressors, now off. WBC continues to climb, 36.2 today. [facility] 09/21/2021 NGTD X 2 days. Per primary PAD s/p R AKA, L 2nd toe, L 3rd finger gangrene: Ortho hand and VVS involved. S/P amputation L middle finger 09/23/2021./  Splenic Infarct/abdominal pain:  Initially on heparin drip -> now Eliquis BID. ESRD: Continue HD per usual TTS schedule - next HD 09/24/2021. Use 2.0 K bath. Usual heparin.  HTN/volume: Variable BP, weights much higher here although likely inaccurate if weighing in bed. Will try to maximize UF with HD. Anemia of ESRD: Hgb 7.5, low/stable. Continue Aranesp 200 mcg q Thursday. Secondary Hyperparathyroidism: CorrCa/Phos ok, on Tums as binder. Continue VDRA + sensipar. Chronic hypoxic respiratory failure: Managed by primary T2DM Morbid Obesity  Courtney H. Brown NP-C 09/24/2021, 10:24 AM  [facility] [phone]   Seen and examined independently.  Agree with note and exam as documented above by physician extender and as noted here.  Seen and examined on dialysis.  Procedure supervised.  Blood pressure 158/74 and HR 89 on HD. RIJ tunn catheter. Tolerating goal.  She is on 4 liters oxygen.    ESRD - HD per TTS schedule   Anemia ESRD on aranesp 200 mcg every thursday   Chronic hypoxic resp failure - on 3 liters at home and 4 liters here. stable   PAD -  s/p finger amputation - left middle finger through PIP joint - ACE wrap left hand   Claudia Desanctis, MD 09/24/2021  11:59 AM

## 2021-09-24 NOTE — Care Management Important Message (Signed)
Important Message  Patient Details  Name: Caysie Minnifield MRN: [id number] Date of Birth: [birth year]   Medicare Important Message Given:  Yes     Shelda Altes 09/24/2021, 9:19 AM

## 2021-09-25 DIAGNOSIS — N186 End stage renal disease: Secondary | ICD-10-CM | POA: Diagnosis not present

## 2021-09-25 DIAGNOSIS — I96 Gangrene, not elsewhere classified: Secondary | ICD-10-CM | POA: Diagnosis not present

## 2021-09-25 DIAGNOSIS — D735 Infarction of spleen: Secondary | ICD-10-CM | POA: Diagnosis not present

## 2021-09-25 DIAGNOSIS — D72829 Elevated white blood cell count, unspecified: Secondary | ICD-10-CM | POA: Diagnosis not present

## 2021-09-25 LAB — BASIC METABOLIC PANEL
Anion gap: 10 (ref 5–15)
BUN: 40 mg/dL — ABNORMAL HIGH (ref 6–20)
CO2: 26 mmol/L (ref 22–32)
Calcium: 7.8 mg/dL — ABNORMAL LOW (ref 8.9–10.3)
Chloride: 96 mmol/L — ABNORMAL LOW (ref 98–111)
Creatinine, Ser: 6.37 mg/dL — ABNORMAL HIGH (ref 0.44–1.00)
GFR, Estimated: 7 mL/min — ABNORMAL LOW (ref >60)
Glucose, Bld: 117 mg/dL — ABNORMAL HIGH (ref 70–99)
Potassium: 3.9 mmol/L (ref 3.5–5.1)
Sodium: 132 mmol/L — ABNORMAL LOW (ref 135–145)

## 2021-09-25 LAB — BLOOD CULTURE ID PANEL (REFLEXED) - BCID2

## 2021-09-25 LAB — CBC
HCT: 25 % — ABNORMAL LOW (ref 36.0–46.0)
Hemoglobin: 7.1 g/dL — ABNORMAL LOW (ref 12.0–15.0)
MCH: 27.6 pg (ref 26.0–34.0)
MCHC: 28.4 g/dL — ABNORMAL LOW (ref 30.0–36.0)
MCV: 97.3 fL (ref 80.0–100.0)
Platelets: 282 10*3/uL (ref 150–400)
RBC: 2.57 MIL/uL — ABNORMAL LOW (ref 3.87–5.11)
RDW: 20.8 % — ABNORMAL HIGH (ref 11.5–15.5)
WBC: 29.8 10*3/uL — ABNORMAL HIGH (ref 4.0–10.5)
nRBC: 0.2 % (ref 0.0–0.2)

## 2021-09-25 LAB — LACTATE DEHYDROGENASE: LDH: 194 U/L — ABNORMAL HIGH (ref 98–192)

## 2021-09-25 LAB — PREPARE RBC (CROSSMATCH)

## 2021-09-25 LAB — SURGICAL PATHOLOGY

## 2021-09-25 MED ORDER — METRONIDAZOLE 500 MG/100ML IV SOLN
500.0000 mg | Freq: Two times a day (BID) | INTRAVENOUS | Status: DC
  Administered 2021-09-25 – 2021-10-16 (×39): 500 mg via INTRAVENOUS
  Filled 2021-09-25 (×44): qty 100

## 2021-09-25 MED ORDER — SODIUM CHLORIDE 0.9% IV SOLUTION: Freq: Once | INTRAVENOUS | Status: AC

## 2021-09-25 NOTE — TOC Progression Note (Addendum)
Transition of Care [facility]) - Progression Note    Patient Details  Name: Courtney Gray MRN: [id number] Date of Birth: [birth year]  Transition of Care [facility]) CM/SW [place], [place] Phone Number: 09/25/2021, 12:47 PM  Clinical Narrative:      Update- CSW called Tressa Busman with [facility] in [place] to see if they can look at referral for LTC placement for patient. CSW awaiting callback.   Patient has No LTC SNF Bed offers. Barriers to LTC SNF placement. CSW informed MD,patient, and patients daughter. CSW will continue to follow and assist with patients dc planning needs.  Expected Discharge Plan: Skilled Nursing Facility Barriers to Discharge: Continued Medical Work up  Expected Discharge Plan and Services Expected Discharge Plan: [place] In-house Referral: Clinical Social Work Discharge Planning Services: CM Consult Post Acute Care Choice: Durable Medical Equipment Living arrangements for the past 2 months: Single Family Home                                       Social Determinants of Health (SDOH) Interventions    Readmission Risk Interventions    09/16/2021    4:36 PM 08/28/2021   11:50 AM 07/12/2021   12:38 PM  Readmission Risk Prevention Plan  Transportation Screening Complete Complete Complete  Medication Review [employer])  Complete Complete  PCP or Specialist appointment within 3-5 days of discharge  Complete Complete  HRI or Home Care Consult Complete Complete Complete  SW Recovery Care/Counseling Consult Complete Complete Complete  Palliative Care Screening  Not Applicable Not Applicable  Skilled Nursing Facility Complete Not Applicable Complete

## 2021-09-25 NOTE — Consult Note (Signed)
[facility]    Date of Admission:  09/09/2021     Reason for Consult: bacteremia, leukocytosis    Referring Provider: Avon Gully   Lines:  6/07-c left neck cvc catheter  Old right IJ HD catheter Old right UE AVF  Abx: 6/21-c metronidazole 6/06-c cefepime        Assessment: 51 y.o. female esrd on iHD via right ij HD catheter (failed rue avf), dm2, pvd, s/p right aka, gangrenous left middle finger, admitted 6/05 with acute left upper quadrant pain with ct imaging splenic infarct, since with persistent leukocytosis despite bsAbx, for which ID is now involved  Leukocytosis bacteremia Splenic infarcts Peripheral gangrene Esrd on iHD   I am not sure what to make of the persistent moderate-significantly elevated wbc. Initially might have had some cellulitis of the left middle finger with the isolated fever and significant leukemoid reaction. Started on cefepime but never had complete resolution of leukocytosis  Has splenic infarct as well and I query if this is one of the several factors driving her leukocytosis. The left middle finger and left 2nd toe dry gangrene could be contributing to leukocytosis too.   She underwent left middle finger amputation and no obvious sign of infection found. The tissue/bone wasn't sent for culture. She remains on cefepime since admission and the wbc has improved since 6/19 even before metronidazole was added for 6/17 blood cx which came back on 6/21 with one of 2 set GNR in anaerobic bottle  She has left central line placed this admission and also right HD line so that could be the source of gnr but I doubt it   While it appears the peripheral gangrene is due to PVD, I don't yet know how to explain her splenic infarct. She is rather obese and the "benign" tte is not helpful  I would like to make sure there is no valvular source causing splenic infarct. Literature does suggest that "splenic infarct" often  subsequently got diagnosed as lymphoma so we can at least entertain that. And lastly did she develop an occult intraabdominal abscess since admission?     Plan: Continue current antimicrobial F/u blood cx Tee Abd/pelv ct with contrast Ldh Repeat blood cx tomorrow She has dialysis line so cefepime can be given tiw post dialysis with that. And we can remove the left cvc to decrease risk of CLABSI Discussed with primary team   I spent 75 minute reviewing data/chart, and coordinating care and >50% direct face to face time providing counseling/discussing diagnostics/treatment plan with patient      ------------------------------------------------ Principal Problem:   Splenic infarction Active Problems:   Uncontrolled type 2 diabetes mellitus with hypoglycemia, with long-term current use of insulin ([place])   Essential hypertension   ESRD (end stage renal disease) ([place])   Chronic respiratory failure with hypoxia ([place])   Pneumonia of left lower lobe due to infectious organism   Lactic acidosis   Mixed diabetic hyperlipidemia associated with type 2 diabetes mellitus (HCC)   Elevated troponin level not due myocardial infarction   Hypotension   PVD (peripheral vascular disease) (HCC)   Hx of AKA (above knee amputation), right (HCC)   Pressure injury of skin    HPI: Courtney Gray is a 51 y.o. female esrd on iHD via right ij HD catheter (failed rue avf), dm2, pvd, s/p right aka, gangrenous left middle finger, admitted 6/05 with acute left upper quadrant pain with ct imaging splenic infarct, since  with persistent leukocytosis despite bsAbx, for which ID is now involved  Hx via patient and chart review  Patient recalled she had a few days acute onset LUQ pain prompting her to come in  She had been pretty much bed bound since her right aka for PVD associated gangrene right foot (on 5/10). She lives with her daughter at home and previously had [place], after being discharged from snf  She  doesn't recall other sx outside of the LUQ pain in terms of fever, chill, rash, joint pain, back pain, headache, visual change. She reports chronic malaise since the last hospital admission  She also had chronic ulcer/gangrene of left middle finger with pain, and this was amputated 6/19. There was no purulence or sign of infection  She has distal left 2nd toe that has been becoming more dry gangrene this admission  She did have a fever of 101 on presentation on 6/05 with wbc 44. She was started on cefepime. Admission bcx negative. Ct as mentioned  Wbc initially improved but stayed moderately elevated in the 20s.   On 6/15 started to have uptick of wbc to 30s. She underwent left middle finger amputation for concern it could be the cause  Her LUQ pain had resolved unclear when, during this admission She has no new n/v/diarrhea/rash or other localizing infectious sx Her right aka stumped had all closed    On 6/17 a repeat blood cx was obtained for rising wbc and that returned today 6/21 with 1 of 2 set with gnr in anaerobic bottle. Of note the wbc has since trended down. Flagyl was added 6/21 (after wbc trends down). She has no sign of sepsis otherwise     Family History  Problem Relation Age of Onset   Diabetes Mother    Diabetes Father     Social History   Tobacco Use   Smoking status: Never   Smokeless tobacco: Never  Vaping Use   Vaping Use: Never used  Substance Use Topics   Alcohol use: Never   Drug use: Never    Allergies  Allergen Reactions   Benzonatate Other (See Comments)    Hallucinations   Hydralazine Other (See Comments)    Hallucinations   Amoxicillin Itching and Nausea And Vomiting   Clonidine Other (See Comments)    Altered mental state, insomnia    Chlorhexidine Itching    Patient reports it is the CHG baths that cause her itching not the CHG used for caring for her IV/HD access.     Morphine Itching   Oxycodone Itching    Pt tolerated hospital  admission 07/2021    Review of Systems: ROS All Other ROS was negative, except mentioned above   Past Medical History:  Diagnosis Date   Arthritis    Diabetes mellitus without complication (HCC)    Dialysis complication, subsequent encounter    Gout    High cholesterol    Hypertension    Renal disorder        Scheduled Meds:  apixaban  5 mg Oral BID   aspirin EC  81 mg Oral Daily   atorvastatin  10 mg Oral QHS   calcitRIOL  1 mcg Oral Q T,Th,Sa-HD   calcium carbonate  1,500 mg Oral TID with meals   cinacalcet  30 mg Oral Q T,Th,Sa-HD   darbepoetin (ARANESP) injection - DIALYSIS  200 mcg Intravenous Q Thu-HD   diclofenac Sodium  2 g Topical QID   feeding supplement  1 Container Oral TID  BM   gabapentin  100 mg Oral Q12H   gabapentin  200 mg Oral Q T,Th,Sa-HD   Gerhardt's butt cream   Topical BID   lactulose  30 g Oral TID   metoCLOPramide (REGLAN) injection  10 mg Intravenous Q6H   multivitamin  1 tablet Oral QHS   pantoprazole  40 mg Oral BID   polyethylene glycol  17 g Oral BID   senna-docusate  1 tablet Oral BID   sodium chloride flush  10-40 mL Intracatheter Q12H   sodium chloride flush  3 mL Intravenous Q12H   Continuous Infusions:  sodium chloride 250 mL (09/23/21 2218)   ceFEPime (MAXIPIME) IV 2 g (09/24/21 1615)   metronidazole 500 mg (09/25/21 1136)   PRN Meds:.acetaminophen **OR** acetaminophen, albuterol, calcium carbonate, glucagon (human recombinant), glucose, HYDROmorphone, iohexol, labetalol, lidocaine (PF), lidocaine-prilocaine, nitroGLYCERIN, ondansetron **OR** ondansetron (ZOFRAN) IV, pentafluoroprop-tetrafluoroeth, prochlorperazine   OBJECTIVE: Blood pressure (!) 149/79, pulse 96, temperature 98.7 F (37.1 C), temperature source Oral, resp. rate 11, height 5\' 5"  (1.651 m), weight (!) 154 kg, SpO2 98 %.  Physical Exam  General/constitutional: no distress, pleasant; obese; appropriate; conversant HEENT: Normocephalic, PER, Conj Clear, EOMI,  Oropharynx clear Neck supple CV: rrr no mrg Lungs: clear to auscultation, normal respiratory effort Abd: Soft, Nontender Ext: no edema Skin: No Rash Neuro: nonfocal MSK: right aka stump all healed no tenderness/swelling; left hand dressing clean; left 2nd toe distal dry gangrene tender -- no erythema  Central line presence: right HD line and left neck cvc site no erythema   Lab Results Lab Results  Component Value Date   WBC 29.8 (H) 09/25/2021   HGB 7.1 (L) 09/25/2021   HCT 25.0 (L) 09/25/2021   MCV 97.3 09/25/2021   PLT 282 09/25/2021    Lab Results  Component Value Date   CREATININE 6.37 (H) 09/25/2021   BUN 40 (H) 09/25/2021   NA 132 (L) 09/25/2021   K 3.9 09/25/2021   CL 96 (L) 09/25/2021   CO2 26 09/25/2021    Lab Results  Component Value Date   ALT 10 09/14/2021   AST 16 09/14/2021   ALKPHOS 124 09/14/2021   BILITOT 0.7 09/14/2021      Microbiology: Recent Results (from the past 240 hour(s))  Culture, blood (Routine X 2) w Reflex to ID Panel     Status: None (Preliminary result)   Collection Time: 09/21/21  8:28 AM   Specimen: BLOOD  Result Value Ref Range Status   Specimen Description BLOOD SITE NOT SPECIFIED  Final   Special Requests   Final    BOTTLES DRAWN AEROBIC AND ANAEROBIC Blood Culture adequate volume   Culture   Final    NO GROWTH 4 DAYS Performed at [facility], [address], [place], [place] [address]    Report Status PENDING  Incomplete  Culture, blood (Routine X 2) w Reflex to ID Panel     Status: None (Preliminary result)   Collection Time: 09/21/21  8:36 AM   Specimen: BLOOD  Result Value Ref Range Status   Specimen Description BLOOD SITE NOT SPECIFIED  Final   Special Requests   Final    BOTTLES DRAWN AEROBIC AND ANAEROBIC Blood Culture adequate volume   Culture  Setup Time   Final    GRAM NEGATIVE RODS ANAEROBIC BOTTLE ONLY Organism ID to follow CRITICAL RESULT CALLED TO, READ BACK BY AND VERIFIED WITHFerne Coe  PHARMD, AT 0805 09/25/21 [facility] Performed at [facility],  [address], [place], [place] [address]    Culture GRAM NEGATIVE RODS  Final   Report Status PENDING  Incomplete  Blood Culture ID Panel (Reflexed)     Status: None   Collection Time: 09/21/21  8:36 AM  Result Value Ref Range Status   Enterococcus faecalis NOT DETECTED NOT DETECTED Final   Enterococcus Faecium NOT DETECTED NOT DETECTED Final   Listeria monocytogenes NOT DETECTED NOT DETECTED Final   Staphylococcus species NOT DETECTED NOT DETECTED Final   Staphylococcus aureus (BCID) NOT DETECTED NOT DETECTED Final   Staphylococcus epidermidis NOT DETECTED NOT DETECTED Final   Staphylococcus lugdunensis NOT DETECTED NOT DETECTED Final   Streptococcus species NOT DETECTED NOT DETECTED Final   Streptococcus agalactiae NOT DETECTED NOT DETECTED Final   Streptococcus pneumoniae NOT DETECTED NOT DETECTED Final   Streptococcus pyogenes NOT DETECTED NOT DETECTED Final   A.calcoaceticus-baumannii NOT DETECTED NOT DETECTED Final   Bacteroides fragilis NOT DETECTED NOT DETECTED Final   Enterobacterales NOT DETECTED NOT DETECTED Final   Enterobacter cloacae complex NOT DETECTED NOT DETECTED Final   Escherichia coli NOT DETECTED NOT DETECTED Final   Klebsiella aerogenes NOT DETECTED NOT DETECTED Final   Klebsiella oxytoca NOT DETECTED NOT DETECTED Final   Klebsiella pneumoniae NOT DETECTED NOT DETECTED Final   Proteus species NOT DETECTED NOT DETECTED Final   Salmonella species NOT DETECTED NOT DETECTED Final   Serratia marcescens NOT DETECTED NOT DETECTED Final   Haemophilus influenzae NOT DETECTED NOT DETECTED Final   Neisseria meningitidis NOT DETECTED NOT DETECTED Final   Pseudomonas aeruginosa NOT DETECTED NOT DETECTED Final   Stenotrophomonas maltophilia NOT DETECTED NOT DETECTED Final   Candida albicans NOT DETECTED NOT DETECTED Final   Candida auris NOT DETECTED NOT DETECTED Final   Candida glabrata NOT DETECTED  NOT DETECTED Final   Candida krusei NOT DETECTED NOT DETECTED Final   Candida parapsilosis NOT DETECTED NOT DETECTED Final   Candida tropicalis NOT DETECTED NOT DETECTED Final   Cryptococcus neoformans/gattii NOT DETECTED NOT DETECTED Final    Comment: Performed at [facility], [address] [place], [place] [address]  Surgical pcr screen     Status: None   Collection Time: 09/23/21  5:57 PM   Specimen: Nasal Mucosa; Nasal Swab  Result Value Ref Range Status   MRSA, PCR NEGATIVE NEGATIVE Final   Staphylococcus aureus NEGATIVE NEGATIVE Final    Comment: (NOTE) The Xpert SA Assay (FDA approved for NASAL specimens in patients 76 years of age and older), is one component of a comprehensive surveillance program. It is not intended to diagnose infection nor to guide or monitor treatment. Performed at [facility], [place] [address] [place], [place] [address]      Serology:    Imaging: If present, new imagings (plain films, ct scans, and mri) have been personally visualized and interpreted; radiology reports have been reviewed. Decision making incorporated into the Impression / Recommendations.  6/08 ct chest angio 1. No evidence for pulmonary embolism. 2. Small amount of atelectasis and airspace disease in the left lower lobe worrisome for infection. 3. Trace bilateral pleural effusions. 4. Enteric tube tip is in the proximal thoracic esophagus. Recommend repositioning.   Aortic Atherosclerosis    6/06 ct abd with contrast 1. Hypoenhancing bands in the spleen with a small amount of perisplenic free fluid, suggesting infarcts. Laceration or rupture could also be considered if there is a history of trauma. 2. Trace left pleural effusion with atelectasis or infiltrate. 3.  Bilateral renal atrophy. 4. Aortic atherosclerosis.    Jabier Mutton, [facility] for Infectious [place] [phone] pager    09/25/2021, 12:26 PM

## 2021-09-25 NOTE — Progress Notes (Addendum)
[facility] Progress Note   Subjective: C/O hearing her heart beat in her ears, otherwise resting quietly. Denies SOB but still wearing o2. HD 09/26/2021 on schedule.    Objective Vitals:   09/24/21 1214 09/24/21 1639 09/24/21 2006 09/25/21 0451  BP:   (!) 143/74 (!) 149/79  Pulse:   96   Resp:   20 11  Temp:  99.1 F (37.3 C) 99 F (37.2 C) 98.7 F (37.1 C)  TempSrc:  Oral Oral Oral  SpO2:   (!) 89% 98%  Weight: (!) 154 kg     Height:       Physical Exam General: Chronically ill appearing morbidly obese female in NAD Heart:S1,S2 slightly distant (body habitus). No M/R/G  Lungs: CTAB slightly decreased in bases Abdomen: obese, soft, NABS Extremities:R AKA difficult to assess for stump edema as pannus in on top of stump but looks OK. LLE with trace-1+ edema. Ace Wrap L hand.  Dialysis Access: RIJ TDC drsg intact IV Access: [facility] Triple lumen SVL    Additional Objective Labs: Basic Metabolic Panel: Recent Labs  Lab 09/19/21 0915 09/21/21 0521 09/23/21 0712 09/24/21 0850 09/25/21 0450  NA 132*   < > 131* 132* 132*  K 4.5   < > 4.0 4.1 3.9  CL 93*   < > 92* 96* 96*  CO2 26   < > 26 25 26   GLUCOSE 88   < > 173* 141* 117*  BUN 38*   < > 40* 52* 40*  CREATININE 7.34*   < > 6.53* 7.95* 6.37*  CALCIUM 8.2*   < > 8.1* 8.2* 7.8*  PHOS 4.8*  --   --  4.8*  --    < > = values in this interval not displayed.   Liver Function Tests: Recent Labs  Lab 09/19/21 0915 09/24/21 0850  ALBUMIN 2.0* 1.7*   No results for input(s): "LIPASE", "AMYLASE" in the last 168 hours. CBC: Recent Labs  Lab 09/19/21 0915 09/21/21 0521 09/23/21 0712 09/24/21 0850 09/25/21 0450  WBC 23.6* 36.4* 36.2* 32.0* 29.8*  HGB 8.4* 8.0* 8.0* 7.5* 7.1*  HCT 29.1* 27.6* 27.4* 26.4* 25.0*  MCV 95.4 94.2 96.5 96.7 97.3  PLT 234 266 301 305 282   Blood Culture    Component Value Date/Time   SDES BLOOD SITE NOT SPECIFIED 09/21/2021 0836   SPECREQUEST  09/21/2021 0836    BOTTLES  DRAWN AEROBIC AND ANAEROBIC Blood Culture adequate volume   CULT GRAM NEGATIVE RODS 09/21/2021 0836   REPTSTATUS PENDING 09/21/2021 0836    Cardiac Enzymes: No results for input(s): "CKTOTAL", "CKMB", "CKMBINDEX", "TROPONINI" in the last 168 hours. CBG: Recent Labs  Lab 09/21/21 1632 09/21/21 2230 09/22/21 0748 09/23/21 1836 09/23/21 2101  GLUCAP 162* 245* 170* 130* 136*   Iron Studies: No results for input(s): "IRON", "TIBC", "TRANSFERRIN", "FERRITIN" in the last 72 hours. @lablastinr3 @ Studies/Results: No results found. Medications:  sodium chloride 250 mL (09/23/21 2218)   ceFEPime (MAXIPIME) IV 2 g (09/24/21 1615)   metronidazole      apixaban  5 mg Oral BID   aspirin EC  81 mg Oral Daily   atorvastatin  10 mg Oral QHS   calcitRIOL  1 mcg Oral Q T,Th,Sa-HD   calcium carbonate  1,500 mg Oral TID with meals   cinacalcet  30 mg Oral Q T,Th,Sa-HD   darbepoetin (ARANESP) injection - DIALYSIS  200 mcg Intravenous Q Thu-HD   diclofenac Sodium  2 g Topical QID   feeding supplement  1 Container Oral TID BM   gabapentin  100 mg Oral Q12H   gabapentin  200 mg Oral Q T,Th,Sa-HD   Gerhardt's butt cream   Topical BID   lactulose  30 g Oral TID   metoCLOPramide (REGLAN) injection  10 mg Intravenous Q6H   multivitamin  1 tablet Oral QHS   pantoprazole  40 mg Oral BID   polyethylene glycol  17 g Oral BID   senna-docusate  1 tablet Oral BID   sodium chloride flush  10-40 mL Intracatheter Q12H   sodium chloride flush  3 mL Intravenous Q12H     Dialysis Orders: [name] TTS 4h 26min, BFR 400, EDW 151 kg, 1K/2.5Ca bath, TDC - 4/29 > HBsAg neg w/ Abs < 10 Hep 2,600 load and 1,500 hourly-Total dose 9275 units per treatment.  - mircera 200ug q2 due 09/10/21 - calcitriol 1 ug tiw - sensipar 30 mg tiw   Assessment/Plan: Septic shock: On admit, requiring short-term pressors, now off. WBC continues to climb, 36.2 today. [facility] 09/21/2021 NGTD X 2 days. Per primary PAD s/p R AKA, L  2nd toe, L 3rd finger gangrene: Ortho hand and VVS involved. S/P amputation L middle finger 09/23/2021./  Splenic Infarct/abdominal pain: Initially on heparin drip -> now Eliquis BID. ESRD: Continue HD per usual TTS schedule - next HD 09/26/2021. Use 2.0 K bath. Usual heparin.  HTN/volume:Still above OP EDW. Low Net UF 1.6 L with HD 09/24/2021. Will try to maximize UF with HD. Anemia of ESRD: Hgb 7.1 low/stable. Continue Aranesp 200 mcg q Thursday. Transfuse 1 unit of PRBCs with HD 09/26/2021. NOTE: patient is having menstrual period but HGB still too low. Agrees to transfusion.  Secondary Hyperparathyroidism: CorrCa/Phos ok, on Tums as binder. Continue VDRA + sensipar. Chronic hypoxic respiratory failure: Managed by primary T2DM Morbid Obesity  Courtney H. Brown NP-C 09/25/2021, 10:09 AM  [employer] [phone]  Seen and examined independently.  Agree with note and exam as documented above by physician extender and as noted here.  Leukocytosis improved slightly.  We discussed risks/benefits/indications for PRBC's and she does consent to PRBC's. Had some nausea and they are getting her a PRN.  States she is on her period.   General adult female in bed in no acute distress HEENT normocephalic atraumatic extraocular movements intact sclera anicteric Neck supple trachea midline Lungs clear to auscultation bilaterally normal work of breathing at rest  Heart S1S2 no rub Abdomen soft nontender nondistended Extremities right AKA left leg no pitting edema; ace wrap left hand   Psych normal mood and affect Neuro - alert and oriented x 3 provides hx and follows commands Access RIJ tunn catheter  ESRD - HD per TTS schedule - HD again tomorrow    Anemia ESRD on aranesp 200 mcg every Thursday. Anticipate PRBC with HD tomorrow.    Chronic hypoxic resp failure - on 3 liters at home and around 4 liters here. stable   PAD -  s/p finger amputation - left middle finger through PIP joint -  ACE wrap left hand   Claudia Desanctis, MD 09/25/2021  11:42 AM

## 2021-09-25 NOTE — Progress Notes (Signed)
Physical Therapy Treatment Patient Details Name: Courtney Gray MRN: [id number] DOB: 01-22-1971 Today's Date: 09/25/2021   History of Present Illness 51 y.o. female admitted 09/09/21 with severe LUQ abdominal pain, hypoglycemia. 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    PT Comments     Pt very flat at times, poor signal on SPO2 sensor, very sweaty this session.  She continues to require heavy max +2 assistance to mobilize and move OOB to another surface.     Recommendations for follow up therapy are one component of a multi-disciplinary discharge planning process, led by the attending physician.  Recommendations may be updated based on patient status, additional functional criteria and insurance authorization.  Follow Up Recommendations  Long-term institutional care without follow-up therapy     Assistance Recommended at Discharge    Patient can return home with the following     Equipment Recommendations       Recommendations for Other Services       Precautions / Restrictions Precautions Precautions: Fall;Other (comment) Precaution Comments: H/o R AKA (08/2021), chronic 2L O2 Restrictions Weight Bearing Restrictions: No RLE Weight Bearing: Non weight bearing     Mobility  Bed Mobility Overal bed mobility: Needs Assistance Bed Mobility: Rolling Rolling: Mod assist   Supine to sit: HOB elevated, Mod assist Sit to supine: Mod assist   General bed mobility comments: mod assist for rolling and to maintain side lying to remove pads and apply lotion    Transfers Overall transfer level: Needs assistance Equipment used:  (maxi move) Transfers: Bed to chair/wheelchair/BSC             General transfer comment: transferred from recliner to bed with maximove lift Transfer via Lift Equipment: Maximove  Ambulation/Gait                    Stairs             Wheelchair Mobility    Modified Rankin (Stroke Patients Only)       Balance                                            Cognition Arousal/Alertness: Awake/alert Behavior During Therapy: WFL for tasks assessed/performed Overall Cognitive Status: Within Functional Limits for tasks assessed                                 General Comments: decreased insight into deficits        Exercises General Exercises - Lower Extremity Ankle Circles/Pumps: AROM, Left, Seated, 15 reps Quad Sets: AROM, Left, 10 reps, Supine Long Arc Quad: AROM, Left, Seated, 15 reps Heel Slides: AROM, Left, 10 reps, Supine    General Comments        Pertinent Vitals/Pain Pain Assessment Pain Assessment: Faces Faces Pain Scale: Hurts little more Pain Location: right residual limb during transfer and bed mobility/ overall generalized as well Pain Descriptors / Indicators: Discomfort, Grimacing Pain Intervention(s): Monitored during session, Repositioned    Home Living                          Prior Function  PT Goals (current goals can now be found in the care plan section) Acute Rehab PT Goals Patient Stated Goal: d/c to LTC/ALF Potential to Achieve Goals: Fair Progress towards PT goals: Progressing toward goals    Frequency    Min 2X/week      PT Plan Current plan remains appropriate    Co-evaluation              AM-PAC PT "6 Clicks" Mobility   Outcome Measure                   End of Session Equipment Utilized During Treatment: Oxygen Activity Tolerance: Patient tolerated treatment well Patient left: in chair;with call bell/phone within reach;with chair alarm set;Other (comment) Nurse Communication: Mobility status PT Visit Diagnosis: Other abnormalities of gait and mobility (R26.89);Muscle weakness (generalized) (M62.81) Pain - part of body: Leg     Time: 1459-1551 PT Time  Calculation (min) (ACUTE ONLY): 52 min  Charges:  $Therapeutic Exercise: 8-22 mins $Therapeutic Activity: 23-37 mins                     Erasmo Leventhal , PTA Acute Rehabilitation Services  Office [phone]    Cristela Blue 09/25/2021, 3:56 PM

## 2021-09-25 NOTE — Progress Notes (Signed)
Progress Note  Patient: Courtney Gray LGX:[id number] DOB: 1970/10/26  DOA: 09/09/2021  DOS: 09/25/2021    Brief hospital course: Courtney Gray is a 51 y.o. 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 She was hypoglycemic, febrile with CT revealing hypoenhancement in the spleen consistent with infarct.   Assessment and Plan:  Distributive shock with lactic acidosis and presumed acute infection Rule out bacteremia, likely POA Concurrent left third digit ischemia/gangrene, POA - Pt has gangrene of left third digit and splenic infarcts which are the likely culprit for presentation. No other definite nidus of infection currently noted.  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  Ultimately off midodrine with stabilizing blood pressure. 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  -Orthopedics following, appreciate insight and recommendations, amputation 09/23/2021 tolerated well - Vascular surgery stated she has multiphasic flow per recent duplex - Pain currently well controlled  IDT2DM uncontrolled with hypoglycemia:  - HbA1c 5.7% and recurrently hypoglycemic here requiring D10 at one point (now off). - Hold insulin and follow clinically. Glucagon prn - Glucose well controlled on diabetic diet in-house  Splenic infarct:  - No embolic source noted on echo or CT's - Unclear if possibly infectious vs embolic given above - Heparin gtt- transition to  eliquis 5 BID - Pain control with oral dilaudid.   Severe PAD s/p right AKA:  - Significant vascular compromise of LLE as well.  - VVS consulted, planning outpatient suture removal 6/13, will see here if still admitted. Recommended monitoring and protected LLE as this would be at high risk for amputation if wounded. - Continue statin, anticoagulation. Can restart ASA once more stable.  Left second toe cyanosis, cannot rule out early necrosis -Extremely poor vasculature given above, orthopedics has been asked to reevaluate the hand, would also appreciate insight into recommendations for her left second toe. Follow up Podiatry (Dr. Posey Pronto) for definitive care/possible amputation in the next week(late June) per their schedule  Acute on chronic hypoxic respiratory failure, suspected OHS, OSA, resolved:  - Continue supplemental O2 to maintain SpO2 90-95% to avoid risk of hypercarbia. Continue BiPAP qHS. On abx and anticoagulation as above already. - Atelectasis and acute on chronic hypoventilation strongly suspected to be contributing.  Continue physical therapy, early ambulation and incentive spirometer - Bronchodilator prn, no wheezing currently - Chronically on 2 L nasal cannula, back to baseline at this point  GERD:  - Augmented PPI to BID, added carafate  Nausea:  - Continue zofran prn and compazine prn refractory symptoms.  ESRD:  - Back on TTS scheduled for HD per nephrology.    Poor IV access:  S/P left EJ triple lumen catheter placed by IR 6/7. This is still needed at this time. Defer to ID for line exchange given potential bacteremia as above  Morbid obesity: Body mass index is 56.5 kg/m.   HTN: Previously requiring midodrine.  -Previously on amlodipine and carvedilol, currently well controlled  -May resume at low-dose prior to discharge  Demand myocardial  ischemia: Mild troponin elevation without angina.  - Defer further work up at this time.   Anemia of ESRD:  - Defer  to nephrology whether ESA should be held. No active bleeding, will monitor hgb on anticoagulation.  HLD:  - Continue statin  Constipation: LBM 6/8 - Continue regular bowel regimen.   Stage 2 left and buttocks pressure injuries, POA:  - Offload as able.   Needs to start PT/OT, ordered. TOC consulted for LTC vs. SNF placement  Subjective: No acute issues or events overnight, patient remains somewhat fatigued and weak but denies nausea vomiting diarrhea constipation headache fevers chills or chest pain  Objective: Vitals:   09/24/21 1214 09/24/21 1639 09/24/21 2006 09/25/21 0451  BP:   (!) 143/74 (!) 149/79  Pulse:   96   Resp:   20 11  Temp:  99.1 F (37.3 C) 99 F (37.2 C) 98.7 F (37.1 C)  TempSrc:  Oral Oral Oral  SpO2:   (!) 89% 98%  Weight: (!) 154 kg     Height:       Gen: 51 y.o. female in no distress Pulm: Nonlabored at rest without wheezes or crackles anterolaterally. CV: Regular rate and rhythm. Soft systolic murmur at base without any new murmur, rub, or gallop. UTD JVD, no pitting dependent edema. GI: Abdomen soft, not significantly tender, non-distended, with normoactive bowel sounds.  Ext: Warm, no deformities Skin: No new rashes, lesions or ulcers on visualized skin. Left hand bandage clean/dry/intact. Left foot second digit with ischemia distal to PIP. Neuro: Alert and oriented. No focal neurological deficits. Psych: Judgement and insight appear fair. Mood euthymic & affect congruent. Behavior is appropriate.    Data Personally reviewed: CBC: Recent Labs  Lab 09/19/21 0915 09/21/21 0521 09/23/21 0712 09/24/21 0850 09/25/21 0450  WBC 23.6* 36.4* 36.2* 32.0* 29.8*  HGB 8.4* 8.0* 8.0* 7.5* 7.1*  HCT 29.1* 27.6* 27.4* 26.4* 25.0*  MCV 95.4 94.2 96.5 96.7 97.3  PLT 234 266 301 305 956    Basic Metabolic Panel: Recent Labs  Lab 09/19/21 0915 09/21/21 0521 09/23/21 0712 09/24/21 0850 09/25/21 0450  NA 132* 128* 131* 132* 132*  K 4.5 4.0 4.0 4.1  3.9  CL 93* 91* 92* 96* 96*  CO2 26 26 26 25 26   GLUCOSE 88 202* 173* 141* 117*  BUN 38* 36* 40* 52* 40*  CREATININE 7.34* 6.80* 6.53* 7.95* 6.37*  CALCIUM 8.2* 7.9* 8.1* 8.2* 7.8*  PHOS 4.8*  --   --  4.8*  --     GFR: Estimated Creatinine Clearance: 15.8 mL/min (A) (by C-G formula based on SCr of 6.37 mg/dL (H)). Liver Function Tests: Recent Labs  Lab 09/19/21 0915 09/24/21 0850  ALBUMIN 2.0* 1.7*    No results for input(s): "LIPASE", "AMYLASE" in the last 168 hours.  No results for input(s): "AMMONIA" in the last 168 hours. Coagulation Profile: No results for input(s): "INR", "PROTIME" in the last 168 hours.  Cardiac Enzymes: No results for input(s): "CKTOTAL", "CKMB", "CKMBINDEX", "TROPONINI" in the last 168 hours. BNP (last 3 results) No results for input(s): "PROBNP" in the last 8760 hours. HbA1C: No results for input(s): "HGBA1C" in the last 72 hours. CBG: Recent Labs  Lab 09/21/21 1632 09/21/21 2230 09/22/21 0748 09/23/21 1836 09/23/21 2101  GLUCAP 162* 245* 170* 130* 136*    Lipid Profile: No results for input(s): "CHOL", "HDL", "LDLCALC", "TRIG", "CHOLHDL", "LDLDIRECT" in the last 72 hours. Thyroid Function Tests: No results for input(s): "TSH", "T4TOTAL", "FREET4", "T3FREE", "THYROIDAB"  in the last 72 hours. Anemia Panel: No results for input(s): "VITAMINB12", "FOLATE", "FERRITIN", "TIBC", "IRON", "RETICCTPCT" in the last 72 hours. Urine analysis: No results found for: "COLORURINE", "APPEARANCEUR", "LABSPEC", "PHURINE", "GLUCOSEU", "HGBUR", "BILIRUBINUR", "KETONESUR", "PROTEINUR", "UROBILINOGEN", "NITRITE", "LEUKOCYTESUR" Recent Results (from the past 240 hour(s))  Culture, blood (Routine X 2) w Reflex to ID Panel     Status: None (Preliminary result)   Collection Time: 09/21/21  8:28 AM   Specimen: BLOOD  Result Value Ref Range Status   Specimen Description BLOOD SITE NOT SPECIFIED  Final   Special Requests   Final    BOTTLES DRAWN AEROBIC AND  ANAEROBIC Blood Culture adequate volume   Culture   Final    NO GROWTH 4 DAYS Performed at [facility], [address] [place], [place] [address]    Report Status PENDING  Incomplete  Culture, blood (Routine X 2) w Reflex to ID Panel     Status: None (Preliminary result)   Collection Time: 09/21/21  8:36 AM   Specimen: BLOOD  Result Value Ref Range Status   Specimen Description BLOOD SITE NOT SPECIFIED  Final   Special Requests   Final    BOTTLES DRAWN AEROBIC AND ANAEROBIC Blood Culture adequate volume   Culture  Setup Time   Final    GRAM NEGATIVE RODS ANAEROBIC BOTTLE ONLY Organism ID to follow Performed at [facility], [place] [address], [place], [place] [address]    Culture GRAM NEGATIVE RODS  Final   Report Status PENDING  Incomplete  Surgical pcr screen     Status: None   Collection Time: 09/23/21  5:57 PM   Specimen: Nasal Mucosa; Nasal Swab  Result Value Ref Range Status   MRSA, PCR NEGATIVE NEGATIVE Final   Staphylococcus aureus NEGATIVE NEGATIVE Final    Comment: (NOTE) The Xpert SA Assay (FDA approved for NASAL specimens in patients 31 years of age and older), is one component of a comprehensive surveillance program. It is not intended to diagnose infection nor to guide or monitor treatment. Performed at [facility], [place] [address], [place], [place] [address]       No results found.   Family Communication: Daughter at bedside 6/11  Disposition: Status is: Inpatient Remains inpatient appropriate because: Remains critically ill Planned Discharge Destination:  TBD  - SNF vs. LTC.  Given ongoing care needs, ambulatory dysfunction and critical illness.  Little Ishikawa, MD 09/25/2021 6:59 AM Page by [email or web address]

## 2021-09-25 NOTE — Progress Notes (Signed)
Nutrition Follow-up  DOCUMENTATION CODES:  Not applicable  INTERVENTION:  - Boost Breeze po TID, each supplement provides 250 kcal and 9 grams of protein - Continue Regular diet with 1200 ml fluid restriction to promote PO intake - Renal MVI daily - Consider alternate means of nutrition if oral intake does not improve  NUTRITION DIAGNOSIS:  Inadequate oral intake related to nausea as evidenced by per patient/family report. Ongoing  GOAL:  Patient will meet greater than or equal to 90% of their needs Unmet  MONITOR:  PO intake, Supplement acceptance, Labs, Weight trends, Skin, I & O's  REASON FOR ASSESSMENT:  Consult Enteral/tube feeding initiation and management (advance tube feeds to goal)  ASSESSMENT:  51 year old female who presented to the ED on 6/06 with abdominal pain, N/V, constipation. PMH of ESRD on HD, gangrene of RLE s/p recent R AKA, CHF, CAD, T2DM, chronic hypoxic respiratory failure on home oxygen, HLD, HTN, gout, severe PAD. Pt admitted with splenic infarction, PNA of LLL, shock.  06/06 - NG tube placed 06/08 - NG tube removed during CT scan, replaced by nursing 06/09 - NG tube removed, Cortrak placement attempted but unsuccessful due to pt refusal 6/19 - left middle finger amputation  Pt resting in bed at the time of visit, nursing administering medications. Pt reports poor appetite. States that since surgery she has not felt like eating or drinking her boost breeze.   Again discussed with pt the importance of nutrition in her recovery. Discussed that she was currently trying to be strengthened so she could return home, but that she would not have the energy to rehab if she was not providing her body with nutrition. Re-offered the cortrak tube to patient who quickly declined. Reports she will increase her intake. Encouraged her to drink supplements in place of water to ensure nutrition was being provided with all intake.  EDW: 151 kg Admit weight: 150.6  kg Current weight: 154 kg Net IO Since Admission: 15,143.45 mL [09/25/21 0931]  Average Meal Intake: 6/13: 25% intake x 1 recorded meals 6/14-6/21: 88% intake x 2 recorded meals  Nutritionally Relevant Medications: Scheduled Meds:  atorvastatin  10 mg Oral QHS   calcitRIOL  1 mcg Oral Q T,Th,Sa-HD   calcium carbonate  1,500 mg Oral TID with meals   cinacalcet  30 mg Oral Q T,Th,Sa-HD   feeding supplement  1 Container Oral TID BM   lactulose  30 g Oral TID   metoCLOPramide (REGLAN) injection  10 mg Intravenous Q6H   multivitamin  1 tablet Oral QHS   pantoprazole  40 mg Oral BID   polyethylene glycol  17 g Oral BID   senna-docusate  1 tablet Oral BID   Continuous Infusions:  sodium chloride 250 mL (09/23/21 2218)   ceFEPime (MAXIPIME) IV 2 g (09/24/21 1615)   metronidazole     PRN Meds: calcium carbonate, ondansetron, prochlorperazine  Labs Reviewed: Na 132, chloride 96 BUN 40, creatinine 6.37  NUTRITION - FOCUSED PHYSICAL EXAM: Flowsheet Row Most Recent Value  Orbital Region No depletion  Upper Arm Region No depletion  Thoracic and Lumbar Region No depletion  Buccal Region No depletion  Temple Region No depletion  Clavicle Bone Region No depletion  Clavicle and Acromion Bone Region No depletion  Scapular Bone Region No depletion  Dorsal Hand No depletion  Patellar Region No depletion  Anterior Thigh Region No depletion  Posterior Calf Region No depletion  Edema (RD Assessment) Moderate  [BUE, BLE]  Hair Reviewed  Eyes Reviewed  Mouth Reviewed  Skin Reviewed  Nails Reviewed   Diet Order:   Diet Order             Diet regular Room service appropriate? Yes; Fluid consistency: Thin  Diet effective now                   EDUCATION NEEDS:  Education needs have been addressed  Skin:  Skin Assessment: Skin Integrity Issues: Stage II: bilateral buttocks Incisions: RLE s/p AKA Other: left middle finger amputation  Last BM:  6/20  Height:  Ht Readings  from Last 1 Encounters:  09/10/21 5\' 5"  (1.651 m)    Weight:  Wt Readings from Last 1 Encounters:  09/24/21 (!) 154 kg    Ideal Body Weight:  52.3 kg (adjusted for R AKA)  BMI:  Body mass index is 56.5 kg/m.  Estimated Nutritional Needs:  Kcal:  1800-2000 Protein:  90-110 grams Fluid:  1000 ml + UOP   Ranell Patrick, RD, LDN Clinical Dietitian RD pager # available in [facility]  After hours/weekend pager # available in [facility]

## 2021-09-25 NOTE — Progress Notes (Signed)
PHARMACY - PHYSICIAN COMMUNICATION CRITICAL VALUE ALERT - BLOOD CULTURE IDENTIFICATION (BCID)  Courtney Gray is an 51 y.o. female who presented to [facility] on 09/09/2021 with a chief complaint of abd pain, N/V  Assessment:  57 YOF on antibiotics to cover for digit gangrene now s/p amputations 6/19 with 6/17 cultures growing GNR in 1 of 4 bottles - anaerobic only, BCID not detecting anything on the panel run. Could represent GN anaerobes such as bacteroides species (other than B frag), fusobacterium, prevotella.  Name of physician (or Provider) Contacted: Avon Gully  Current antibiotics: Cefepime  Changes to prescribed antibiotics recommended:  Add Flagyl, continue Cefepime as likely polymicrobial infection  Results for orders placed or performed during the hospital encounter of 09/01/20  Blood Culture ID Panel (Reflexed) (Collected: 09/01/2020  3:00 PM)  Result Value Ref Range   Enterococcus faecalis NOT DETECTED NOT DETECTED   Enterococcus Faecium NOT DETECTED NOT DETECTED   Listeria monocytogenes NOT DETECTED NOT DETECTED   Staphylococcus species NOT DETECTED NOT DETECTED   Staphylococcus aureus (BCID) NOT DETECTED NOT DETECTED   Staphylococcus epidermidis NOT DETECTED NOT DETECTED   Staphylococcus lugdunensis NOT DETECTED NOT DETECTED   Streptococcus species NOT DETECTED NOT DETECTED   Streptococcus agalactiae NOT DETECTED NOT DETECTED   Streptococcus pneumoniae NOT DETECTED NOT DETECTED   Streptococcus pyogenes NOT DETECTED NOT DETECTED   A.calcoaceticus-baumannii NOT DETECTED NOT DETECTED   Bacteroides fragilis NOT DETECTED NOT DETECTED   Enterobacterales NOT DETECTED NOT DETECTED   Enterobacter cloacae complex NOT DETECTED NOT DETECTED   Escherichia coli NOT DETECTED NOT DETECTED   Klebsiella aerogenes NOT DETECTED NOT DETECTED   Klebsiella oxytoca NOT DETECTED NOT DETECTED   Klebsiella pneumoniae NOT DETECTED NOT DETECTED   Proteus species NOT DETECTED NOT DETECTED    Salmonella species NOT DETECTED NOT DETECTED   Serratia marcescens NOT DETECTED NOT DETECTED   Haemophilus influenzae NOT DETECTED NOT DETECTED   Neisseria meningitidis NOT DETECTED NOT DETECTED   Pseudomonas aeruginosa DETECTED (A) NOT DETECTED   Stenotrophomonas maltophilia NOT DETECTED NOT DETECTED   Candida albicans NOT DETECTED NOT DETECTED   Candida auris NOT DETECTED NOT DETECTED   Candida glabrata NOT DETECTED NOT DETECTED   Candida krusei NOT DETECTED NOT DETECTED   Candida parapsilosis NOT DETECTED NOT DETECTED   Candida tropicalis NOT DETECTED NOT DETECTED   Cryptococcus neoformans/gattii NOT DETECTED NOT DETECTED   CTX-M ESBL NOT DETECTED NOT DETECTED   Carbapenem resistance IMP NOT DETECTED NOT DETECTED   Carbapenem resistance KPC NOT DETECTED NOT DETECTED   Carbapenem resistance NDM NOT DETECTED NOT DETECTED   Carbapenem resistance VIM NOT DETECTED NOT DETECTED    Thank you for allowing pharmacy to be a part of this patient's care.  Alycia Rossetti, PharmD, BCPS Infectious Diseases Clinical Pharmacist 09/25/2021 8:19 AM   **Pharmacist phone directory can now be found on [email or web address] ([email or web address]).  Listed under [place].

## 2021-09-25 NOTE — Plan of Care (Signed)

## 2021-09-26 DIAGNOSIS — D735 Infarction of spleen: Secondary | ICD-10-CM | POA: Diagnosis not present

## 2021-09-26 LAB — CBC
HCT: 24.6 % — ABNORMAL LOW (ref 36.0–46.0)
Hemoglobin: 6.9 g/dL — CL (ref 12.0–15.0)
MCH: 27.3 pg (ref 26.0–34.0)
MCHC: 28 g/dL — ABNORMAL LOW (ref 30.0–36.0)
MCV: 97.2 fL (ref 80.0–100.0)
Platelets: 275 10*3/uL (ref 150–400)
RBC: 2.53 MIL/uL — ABNORMAL LOW (ref 3.87–5.11)
RDW: 20.7 % — ABNORMAL HIGH (ref 11.5–15.5)
WBC: 30.2 10*3/uL — ABNORMAL HIGH (ref 4.0–10.5)
nRBC: 0.2 % (ref 0.0–0.2)

## 2021-09-26 LAB — RENAL FUNCTION PANEL
Albumin: 1.7 g/dL — ABNORMAL LOW (ref 3.5–5.0)
Anion gap: 16 — ABNORMAL HIGH (ref 5–15)
BUN: 55 mg/dL — ABNORMAL HIGH (ref 6–20)
CO2: 22 mmol/L (ref 22–32)
Calcium: 8.3 mg/dL — ABNORMAL LOW (ref 8.9–10.3)
Chloride: 95 mmol/L — ABNORMAL LOW (ref 98–111)
Creatinine, Ser: 7.79 mg/dL — ABNORMAL HIGH (ref 0.44–1.00)
GFR, Estimated: 6 mL/min — ABNORMAL LOW (ref >60)
Glucose, Bld: 153 mg/dL — ABNORMAL HIGH (ref 70–99)
Phosphorus: 5 mg/dL — ABNORMAL HIGH (ref 2.5–4.6)
Potassium: 4.4 mmol/L (ref 3.5–5.1)
Sodium: 133 mmol/L — ABNORMAL LOW (ref 135–145)

## 2021-09-26 LAB — CULTURE, BLOOD (ROUTINE X 2)
Culture: NO GROWTH
Special Requests: ADEQUATE

## 2021-09-26 MED ORDER — HEPARIN SODIUM (PORCINE) 1000 UNIT/ML IJ SOLN
INTRAMUSCULAR | Status: AC
  Administered 2021-09-26: 4000 [IU]
  Filled 2021-09-26: qty 4

## 2021-09-26 MED ORDER — HEPARIN SODIUM (PORCINE) 1000 UNIT/ML IJ SOLN
INTRAMUSCULAR | Status: AC
  Administered 2021-09-26: 9500 [IU] via INTRAVENOUS_CENTRAL
  Filled 2021-09-26: qty 10

## 2021-09-26 MED ORDER — IOHEXOL 9 MG/ML PO SOLN
ORAL | Status: AC
  Filled 2021-09-26: qty 1000

## 2021-09-26 MED ORDER — HEPARIN SODIUM (PORCINE) 1000 UNIT/ML DIALYSIS
9500.0000 [IU] | Freq: Once | INTRAMUSCULAR | Status: DC
  Administered 2021-09-26: 9500 [IU] via INTRAVENOUS_CENTRAL
  Filled 2021-09-26: qty 10

## 2021-09-26 MED ORDER — SODIUM CHLORIDE 0.9 % IV SOLN: INTRAVENOUS | Status: DC

## 2021-09-26 NOTE — Progress Notes (Signed)
Pt Hgb is 6.9 @7 .28, Pt is getting 1 Unit of RBC

## 2021-09-26 NOTE — Progress Notes (Signed)
Occupational Therapy Treatment Patient Details Name: Courtney Gray MRN: [id number] DOB: 06/30/1970 Today's Date: 09/26/2021   History of present illness 51 y.o. female admitted 09/09/21 with severe LUQ abdominal pain, hypoglycemia. 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   OT comments  Pt making incremental progress towards OT goals. She requires increased encouragement for mobility, however once she starts to mobilize, she is able to do more. Pt tolerated sitting EOB for 20+ min this session to complete BADL's with set up only. Pt then required mod-max A for bed mobility to get repositioned and pulled up in bed. Continuing to recommend SNF to maximize independence. OT will follow acutely.    Recommendations for follow up therapy are one component of a multi-disciplinary discharge planning process, led by the attending physician.  Recommendations may be updated based on patient status, additional functional criteria and insurance authorization.    Follow Up Recommendations  Skilled nursing-short term rehab (<3 hours/day)    Assistance Recommended at Discharge Frequent or constant Supervision/Assistance  Patient can return home with the following  Two people to help with walking and/or transfers;Two people to help with bathing/dressing/bathroom;Assistance with cooking/housework;Direct supervision/assist for medications management;Assist for transportation;Help with stairs or ramp for entrance   Equipment Recommendations  Other (comment) (pt has all needed equipment)    Recommendations for Other Services      Precautions / Restrictions Precautions Precautions: Fall;Other (comment) Precaution Comments: H/o R AKA (08/2021), chronic 3L O2 Restrictions Weight Bearing Restrictions: No RLE Weight Bearing: Non weight bearing       Mobility Bed Mobility Overal bed  mobility: Needs Assistance Bed Mobility: Rolling, Supine to Sit Rolling: Max assist, +2 for safety/equipment   Supine to sit: Mod assist, HOB elevated     General bed mobility comments: maxA to roll L/R for linen change. ModA to assist bringing LE over towards EOB and repositioning hips towards EOB    Transfers                   General transfer comment: deferred transfer to recliner due to recently taking laxatives and concerned about BM     Balance Overall balance assessment: Needs assistance Sitting-balance support: No upper extremity supported, Feet unsupported Sitting balance-[name] Scale: Fair                                     ADL either performed or assessed with clinical judgement   ADL Overall ADL's : Needs assistance/impaired     Grooming: Set up;Sitting;Bed level Grooming Details (indicate cue type and reason): completed oral care, face and hand washing EOB                               General ADL Comments: Session focused on bed mobility, with encouragement to transfer to chair next session    Extremity/Trunk Assessment              Vision       Perception     Praxis      Cognition Arousal/Alertness: Awake/alert Behavior During Therapy: WFL for tasks assessed/performed Overall Cognitive Status: Within Functional Limits for tasks assessed  General Comments: decreased insight into deficits        Exercises Exercises: Other exercises General Exercises - Lower Extremity Short Arc Quad: Left, 10 reps, Seated Other Exercises Other Exercises: LUE: flex/extend fingers, flex/extend wrist up and down    Shoulder Instructions       General Comments      Pertinent Vitals/ Pain       Pain Assessment Pain Assessment: Faces Faces Pain Scale: Hurts little more Pain Location: L hand, L toes Pain Descriptors / Indicators: Discomfort, Grimacing Pain Intervention(s):  Monitored during session, Repositioned  Home Living                                          Prior Functioning/Environment              Frequency  Min 2X/week        Progress Toward Goals  OT Goals(current goals can now be found in the care plan section)  Progress towards OT goals: Progressing toward goals  Acute Rehab OT Goals Patient Stated Goal: To get out of bed on her own OT Goal Formulation: With patient Time For Goal Achievement: 09/29/21 Potential to Achieve Goals: Fair ADL Goals Pt Will Perform Lower Body Bathing: with mod assist;with adaptive equipment;sitting/lateral leans Pt Will Perform Lower Body Dressing: with mod assist;with adaptive equipment;sitting/lateral leans Pt Will Transfer to Toilet: with transfer board;with min guard assist;bedside commode Additional ADL Goal #1: Pt will tolerate sitting EOB for 5+ mins with no support to complete ADL tasks. Additional ADL Goal #2: Pt willcomplete bed mobility with min guard, as a precursor to Seated ADL's.  Plan Discharge plan remains appropriate    Co-evaluation    PT/OT/SLP Co-Evaluation/Treatment: Yes Reason for Co-Treatment: For patient/therapist safety;To address functional/ADL transfers PT goals addressed during session: Mobility/safety with mobility;Strengthening/ROM OT goals addressed during session: ADL's and self-care      AM-PAC OT "6 Clicks" Daily Activity     Outcome Measure   Help from another person eating meals?: None Help from another person taking care of personal grooming?: A Little Help from another person toileting, which includes using toliet, bedpan, or urinal?: A Lot Help from another person bathing (including washing, rinsing, drying)?: A Lot Help from another person to put on and taking off regular upper body clothing?: A Little Help from another person to put on and taking off regular lower body clothing?: Total 6 Click Score: 15    End of Session  Equipment Utilized During Treatment: Oxygen  OT Visit Diagnosis: Other abnormalities of gait and mobility (R26.89);Unsteadiness on feet (R26.81);Muscle weakness (generalized) (M62.81);History of falling (Z91.81);Pain Pain - Right/Left: Right Pain - part of body: Leg   Activity Tolerance Patient tolerated treatment well   Patient Left in bed;with call bell/phone within reach;with family/visitor present   Nurse Communication Mobility status        Time: 5284-1324 OT Time Calculation (min): 46 min  Charges: OT General Charges $OT Visit: 1 Visit OT Treatments $Self Care/Home Management : 8-22 mins  Courtney Devita H., OTR/L Acute Rehabilitation  Courtney Gray 09/26/2021, 6:15 PM

## 2021-09-26 NOTE — Procedures (Addendum)
Seen and examined on dialysis.  Blood pressure 121/48 and HR 90.  RIJ tunn catheter in use.  Procedure supervised.  Tolerating goal.  She is to get a unit of blood today. (Not yet infusing) (addended)  Claudia Desanctis, MD 09/26/2021 8:07 AM

## 2021-09-26 NOTE — TOC Progression Note (Signed)
Transition of Care [facility]) - Progression Note    Patient Details  Name: Courtney Gray MRN: [id number] Date of Birth: 09/19/1970  Transition of Care [facility]) CM/SW [place], [place] Phone Number: 09/26/2021, 2:27 PM  Clinical Narrative:     No current LTC SNF bed offers for patient. Barriers to LTC SNF bed placement. CSW sent over referral for possible LTC SNF placement to [place] with [facility] in [place]. CSW awaiting determination. CSW will continue to follow and assist with patients dc planning needs.  Expected Discharge Plan: Skilled Nursing Facility Barriers to Discharge: Continued Medical Work up  Expected Discharge Plan and Services Expected Discharge Plan: [place] In-house Referral: Clinical Social Work Discharge Planning Services: CM Consult Post Acute Care Choice: Durable Medical Equipment Living arrangements for the past 2 months: Single Family Home                                       Social Determinants of Health (SDOH) Interventions    Readmission Risk Interventions    09/16/2021    4:36 PM 08/28/2021   11:50 AM 07/12/2021   12:38 PM  Readmission Risk Prevention Plan  Transportation Screening Complete Complete Complete  Medication Review Press photographer)  Complete Complete  PCP or Specialist appointment within 3-5 days of discharge  Complete Complete  HRI or Home Care Consult Complete Complete Complete  SW Recovery Care/Counseling Consult Complete Complete Complete  Palliative Care Screening  Not Applicable Not Applicable  Skilled Nursing Facility Complete Not Applicable Complete

## 2021-09-26 NOTE — Progress Notes (Addendum)
Progress Note  Patient: Courtney Gray KXF:[id number] DOB: 08-28-1970  DOA: 09/09/2021  DOS: 09/26/2021    Brief hospital course: Courtney Gray is a 51 y.o. 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 She was hypoglycemic, febrile with CT revealing hypoenhancement in the spleen consistent with infarct.   Assessment and Plan:  Distributive shock with lactic acidosis and presumed acute infection Rule out bacteremia, likely POA Concurrent left third digit ischemia/gangrene, POA -Initial source concern to be left third digit given necrotic questionably gangrenous L 3rd finger -TEE/CT abd/pelvis ordered per ID discussion *patient is refusing to cooperate with PO contrast/imaging* She understand that without this imaging we may not be able to uncover her infectious source for new gram negative bacteremia. Offered temporary NG tube for contrast administration and was refused. She understands the risks of noncompliance can lead to worsening outcomes/infection and may delay any potential procedures. -ID to continue to follow - continue cefepime, flagyl -Continues to have elevated WBC/procalcitonin for weeks; hoping source control w/ finger amputation would help but now GNR bacteremia (likely POA) without clear source is complicating her picture. 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 - Hold insulin and follow clinically. Glucagon prn - Profoundly poor PO intake over the past week - Glucose well controlled on diabetic diet in-house  Splenic infarct Questionably secondary to infectious process as above  - No embolic source noted on echo or CT's - Likely septic emboli given bacteremia/gangrenous digit. Cultures as above. - Heparin gtt- transition to eliquis 5 BID - Pain control with oral dilaudid.   Severe PAD s/p right AKA:  - Significant vascular compromise of LLE as well.  - VVS consulted, suture removed 6/13 - Continue statin, anticoagulation. Can restart ASA once more stable.  Left second toe cyanosis, cannot rule out early necrosis -Extremely poor vasculature given above, orthopedics has been asked to reevaluate the hand, would also appreciate insight into recommendations for her left second toe. - Follow up Podiatry (Dr. Posey Pronto) for definitive care/possible amputation in the next week (late June) per their schedule.  Acute on chronic hypoxic respiratory failure, suspected OHS, OSA, resolved:  - Continue supplemental O2 to maintain SpO2 90-95% to avoid risk of hypercarbia. Continue BiPAP qHS. On abx and anticoagulation as above already. - Atelectasis and acute on chronic hypoventilation strongly suspected to be contributing.  Continue physical therapy, early ambulation and incentive spirometer - Bronchodilator prn, no wheezing currently - Chronically on 2 L nasal cannula, back to baseline at this point  ESRD:  - Back on TTS scheduled for HD per nephrology.    Poor IV access:  S/P left EJ triple lumen catheter placed by IR 6/7. This is still needed at this time. Defer to ID for line exchange given potential bacteremia as above - although given GNR it is less likely the culprit.  Morbid obesity: Body mass index is 57.63 kg/m.   HTN:  -Previously  requiring pressors/midodrine - discontinued.  -Previously on amlodipine and carvedilol,  currently well controlled  -May resume at low-dose prior to discharge  Demand myocardial ischemia/troponin elevation secondary to above:  - Mild troponin elevation without angina secondary to hypoxia - Defer further work up at this time given ACS has been ruled out  Anemia of ESRD:  - Defer to nephrology whether ESA should be held. No active bleeding, will monitor hgb on anticoagulation. - Continue transfusion as necessary 1u PRBC 09/26/21  HLD:  - Continue statin  Constipation, resolved: - Continue regular bowel regimen.   Stage 2 left and buttocks pressure injuries, POA:  - Offload as able. Non compliant with recommendations.  Acute on chronic ambulatory dysfunction PT/OT, ordered. TOC consulted for SNF placement  GERD:  - Augmented PPI to BID, added carafate  Nausea, resolving:  - Continue zofran and compazine prn refractory symptoms.  Subjective: No acute issues or events overnight, patient refusing to cooperate with PO contrast for abdomen/pelvis imaging  Objective: Vitals:   09/26/21 0700 09/26/21 0720 09/26/21 0730 09/26/21 0800  BP: (!) 126/57 (!) 126/57 133/81 (!) 121/48  Pulse: 94 (!) 58 99 (!) 34  Resp: 16 15 17 18   Temp: 98.2 F (36.8 C)     TempSrc: Oral     SpO2:      Weight:      Height:       Gen: 51 y.o. female in no distress Pulm: Nonlabored at rest without wheezes or crackles anterolaterally. CV: Regular rate and rhythm. Soft systolic murmur at base without any new murmur, rub, or gallop. UTD JVD, no pitting dependent edema. GI: Abdomen soft, not significantly tender, non-distended, with normoactive bowel sounds.  Ext: Warm, no deformities Skin: No new rashes, lesions or ulcers on visualized skin. Left hand bandage clean/dry/intact. Left foot second digit with ischemia distal to PIP. Neuro: Alert and oriented. No focal neurological deficits. Psych: Judgement and insight appear fair. Mood euthymic & affect congruent. Behavior is appropriate.     Data Personally reviewed: CBC: Recent Labs  Lab 09/19/21 0915 09/21/21 0521 09/23/21 0712 09/24/21 0850 09/25/21 0450  WBC 23.6* 36.4* 36.2* 32.0* 29.8*  HGB 8.4* 8.0* 8.0* 7.5* 7.1*  HCT 29.1* 27.6* 27.4* 26.4* 25.0*  MCV 95.4 94.2 96.5 96.7 97.3  PLT 234 266 301 305 161    Basic Metabolic Panel: Recent Labs  Lab 09/19/21 0915 09/21/21 0521 09/23/21 0712 09/24/21 0850 09/25/21 0450  NA 132* 128* 131* 132* 132*  K 4.5 4.0 4.0 4.1 3.9  CL 93* 91* 92* 96* 96*  CO2 26 26 26 25 26   GLUCOSE 88 202* 173* 141* 117*  BUN 38* 36* 40* 52* 40*  CREATININE 7.34* 6.80* 6.53* 7.95* 6.37*  CALCIUM 8.2* 7.9* 8.1* 8.2* 7.8*  PHOS 4.8*  --   --  4.8*  --     GFR: Estimated Creatinine Clearance: 16 mL/min (A) (by C-G formula based on SCr of 6.37 mg/dL (H)). Liver Function Tests: Recent Labs  Lab 09/19/21 0915 09/24/21 0850  ALBUMIN 2.0* 1.7*    No results for input(s): "LIPASE", "AMYLASE" in the last 168 hours.  No results for input(s): "AMMONIA" in the last 168 hours. Coagulation Profile: No results for input(s): "INR", "PROTIME" in the last 168 hours.  Cardiac Enzymes: No results for input(s): "CKTOTAL", "CKMB", "CKMBINDEX", "TROPONINI" in the last 168 hours. BNP (last 3 results) No results for input(s): "PROBNP" in the last 8760 hours. HbA1C: No results for input(s): "HGBA1C" in the last 72  hours. CBG: Recent Labs  Lab 09/21/21 1632 09/21/21 2230 09/22/21 0748 09/23/21 1836 09/23/21 2101  GLUCAP 162* 245* 170* 130* 136*    Lipid Profile: No results for input(s): "CHOL", "HDL", "LDLCALC", "TRIG", "CHOLHDL", "LDLDIRECT" in the last 72 hours. Thyroid Function Tests: No results for input(s): "TSH", "T4TOTAL", "FREET4", "T3FREE", "THYROIDAB" in the last 72 hours. Anemia Panel: No results for input(s): "VITAMINB12", "FOLATE", "FERRITIN", "TIBC", "IRON", "RETICCTPCT" in the last 72 hours. Urine analysis: No results found for: "COLORURINE", "APPEARANCEUR",  "LABSPEC", "PHURINE", "GLUCOSEU", "HGBUR", "BILIRUBINUR", "KETONESUR", "PROTEINUR", "UROBILINOGEN", "NITRITE", "LEUKOCYTESUR" Recent Results (from the past 240 hour(s))  Culture, blood (Routine X 2) w Reflex to ID Panel     Status: None   Collection Time: 09/21/21  8:28 AM   Specimen: BLOOD  Result Value Ref Range Status   Specimen Description BLOOD SITE NOT SPECIFIED  Final   Special Requests   Final    BOTTLES DRAWN AEROBIC AND ANAEROBIC Blood Culture adequate volume   Culture   Final    NO GROWTH 5 DAYS Performed at [facility], [address], [place], [place] [address]    Report Status 09/26/2021 FINAL  Final  Culture, blood (Routine X 2) w Reflex to ID Panel     Status: None (Preliminary result)   Collection Time: 09/21/21  8:36 AM   Specimen: BLOOD  Result Value Ref Range Status   Specimen Description BLOOD SITE NOT SPECIFIED  Final   Special Requests   Final    BOTTLES DRAWN AEROBIC AND ANAEROBIC Blood Culture adequate volume   Culture  Setup Time   Final    GRAM NEGATIVE RODS ANAEROBIC BOTTLE ONLY CRITICAL RESULT CALLED TO, READ BACK BY AND VERIFIED WITHFerne Coe PHARMD, AT [place] 09/25/21 D. VANHOOK Performed at [facility], [place] [address] [place], [place] [address]    Culture GRAM NEGATIVE RODS  Final   Report Status PENDING  Incomplete  Blood Culture ID Panel (Reflexed)     Status: None   Collection Time: 09/21/21  8:36 AM  Result Value Ref Range Status   Enterococcus faecalis NOT DETECTED NOT DETECTED Final   Enterococcus Faecium NOT DETECTED NOT DETECTED Final   Listeria monocytogenes NOT DETECTED NOT DETECTED Final   Staphylococcus species NOT DETECTED NOT DETECTED Final   Staphylococcus aureus (BCID) NOT DETECTED NOT DETECTED Final   Staphylococcus epidermidis NOT DETECTED NOT DETECTED Final   Staphylococcus lugdunensis NOT DETECTED NOT DETECTED Final   Streptococcus species NOT DETECTED NOT DETECTED Final   Streptococcus agalactiae NOT DETECTED  NOT DETECTED Final   Streptococcus pneumoniae NOT DETECTED NOT DETECTED Final   Streptococcus pyogenes NOT DETECTED NOT DETECTED Final   A.calcoaceticus-baumannii NOT DETECTED NOT DETECTED Final   Bacteroides fragilis NOT DETECTED NOT DETECTED Final   Enterobacterales NOT DETECTED NOT DETECTED Final   Enterobacter cloacae complex NOT DETECTED NOT DETECTED Final   Escherichia coli NOT DETECTED NOT DETECTED Final   Klebsiella aerogenes NOT DETECTED NOT DETECTED Final   Klebsiella oxytoca NOT DETECTED NOT DETECTED Final   Klebsiella pneumoniae NOT DETECTED NOT DETECTED Final   Proteus species NOT DETECTED NOT DETECTED Final   Salmonella species NOT DETECTED NOT DETECTED Final   Serratia marcescens NOT DETECTED NOT DETECTED Final   Haemophilus influenzae NOT DETECTED NOT DETECTED Final   Neisseria meningitidis NOT DETECTED NOT DETECTED Final   Pseudomonas aeruginosa NOT DETECTED NOT DETECTED Final   Stenotrophomonas maltophilia NOT DETECTED NOT DETECTED Final   Candida albicans NOT DETECTED  NOT DETECTED Final   Candida auris NOT DETECTED NOT DETECTED Final   Candida glabrata NOT DETECTED NOT DETECTED Final   Candida krusei NOT DETECTED NOT DETECTED Final   Candida parapsilosis NOT DETECTED NOT DETECTED Final   Candida tropicalis NOT DETECTED NOT DETECTED Final   Cryptococcus neoformans/gattii NOT DETECTED NOT DETECTED Final    Comment: Performed at [facility], [place] [address], [place], [place] [address]  Surgical pcr screen     Status: None   Collection Time: 09/23/21  5:57 PM   Specimen: Nasal Mucosa; Nasal Swab  Result Value Ref Range Status   MRSA, PCR NEGATIVE NEGATIVE Final   Staphylococcus aureus NEGATIVE NEGATIVE Final    Comment: (NOTE) The Xpert SA Assay (FDA approved for NASAL specimens in patients 66 years of age and older), is one component of a comprehensive surveillance program. It is not intended to diagnose infection nor to guide or monitor treatment. Performed  at [facility], [place] [address] [place], [place] [address]       No results found.   Family Communication: Daughter at bedside 6/11  Disposition: Status is: Inpatient Remains inpatient appropriate because: Remains critically ill Planned Discharge Destination:  TBD  - SNF - Given ongoing care needs, ambulatory dysfunction and critical illness.  Little Ishikawa, MD 09/26/2021 8:20 AM Page by [email or web address]

## 2021-09-26 NOTE — Plan of Care (Signed)

## 2021-09-26 NOTE — Progress Notes (Signed)
Physical Therapy Treatment Patient Details Name: Courtney Gray MRN: [id number] DOB: 1970/10/31 Today's Date: 09/26/2021   History of Present Illness 51 y.o. female admitted 09/09/21 with severe LUQ abdominal pain, hypoglycemia. 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    PT Comments    Patient continues to make limited progress towards therapy goals. Deferred OOB mobility this date due to recently taking laxatives and patient concerned about BM. Able to come to EOB with modA for LLE and hip advancement towards EOB. Performed exercises seated on EOB. Once laying in bed, able to roll with maxA+2 for linen change and totalA+2 to reposition higher in bed. D/c plan remains appropriate.     Recommendations for follow up therapy are one component of a multi-disciplinary discharge planning process, led by the attending physician.  Recommendations may be updated based on patient status, additional functional criteria and insurance authorization.  Follow Up Recommendations  Long-term institutional care without follow-up therapy     Assistance Recommended at Discharge Intermittent Supervision/Assistance  Patient can return home with the following Two people to help with walking and/or transfers;A lot of help with bathing/dressing/bathroom;Assistance with cooking/housework;Assist for transportation;Help with stairs or ramp for entrance   Equipment Recommendations  Other (comment) (electric hoyer lift)    Recommendations for Other Services       Precautions / Restrictions Precautions Precautions: Fall;Other (comment) Precaution Comments: H/o R AKA (08/2021), chronic 3L O2 Restrictions Weight Bearing Restrictions: No RLE Weight Bearing: Non weight bearing     Mobility  Bed Mobility Overal bed mobility: Needs Assistance Bed Mobility: Rolling, Supine to Sit Rolling: Max  assist, +2 for safety/equipment   Supine to sit: Mod assist, HOB elevated     General bed mobility comments: maxA to roll L/R for linen change. ModA to assist bringing LE over towards EOB and repositioning hips towards EOB    Transfers                   General transfer comment: deferred transfer to recliner due to recently taking laxatives and concerned about BM    Ambulation/Gait                   Stairs             Wheelchair Mobility    Modified Rankin (Stroke Patients Only)       Balance Overall balance assessment: Needs assistance Sitting-balance support: No upper extremity supported, Feet unsupported Sitting balance-[name] Scale: Fair                                      Cognition Arousal/Alertness: Awake/alert Behavior During Therapy: WFL for tasks assessed/performed Overall Cognitive Status: Within Functional Limits for tasks assessed                                 General Comments: decreased insight into deficits        Exercises General Exercises - Lower Extremity Ankle Circles/Pumps: Left, 10 reps, Seated Short Arc Quad: Left, 10 reps, Seated    General Comments        Pertinent Vitals/Pain Pain Assessment Pain Assessment: Faces Faces Pain Scale: Hurts little more Pain Location: L hand, L toes Pain Descriptors / Indicators: Discomfort,  Grimacing Pain Intervention(s): Monitored during session, Repositioned    Home Living                          Prior Function            PT Goals (current goals can now be found in the care plan section) Acute Rehab PT Goals Patient Stated Goal: d/c to LTC/ALF PT Goal Formulation: With patient Time For Goal Achievement: 09/28/21 Potential to Achieve Goals: Fair Progress towards PT goals: Progressing toward goals    Frequency    Min 2X/week      PT Plan Current plan remains appropriate    Co-evaluation PT/OT/SLP  Co-Evaluation/Treatment: Yes Reason for Co-Treatment: For patient/therapist safety;To address functional/ADL transfers PT goals addressed during session: Mobility/safety with mobility;Strengthening/ROM        AM-PAC PT "6 Clicks" Mobility   Outcome Measure  Help needed turning from your back to your side while in a flat bed without using bedrails?: A Lot Help needed moving from lying on your back to sitting on the side of a flat bed without using bedrails?: A Lot Help needed moving to and from a bed to a chair (including a wheelchair)?: Total Help needed standing up from a chair using your arms (e.g., wheelchair or bedside chair)?: Total Help needed to walk in hospital room?: Total Help needed climbing 3-5 steps with a railing? : Total 6 Click Score: 8    End of Session Equipment Utilized During Treatment: Oxygen Activity Tolerance: Patient tolerated treatment well Patient left: in bed;with call bell/phone within reach;with family/visitor present Nurse Communication: Mobility status PT Visit Diagnosis: Other abnormalities of gait and mobility (R26.89);Muscle weakness (generalized) (M62.81) Pain - Right/Left: Right Pain - part of body: Leg     Time: 7564-3329 PT Time Calculation (min) (ACUTE ONLY): 45 min  Charges:  $Therapeutic Exercise: 8-22 mins $Therapeutic Activity: 8-22 mins                     Katera Rybka A. Gilford Rile PT, DPT Acute Rehabilitation Services Office [phone]    [name] 09/26/2021, 5:22 PM

## 2021-09-26 NOTE — Progress Notes (Signed)
    [facility] has been requested to perform a transesophageal echocardiogram on this patient for bacteremia and splenic infarct.  After careful review of history and examination, the risks and benefits of transesophageal echocardiogram have been explained including risks of esophageal damage, perforation (1:10,000 risk), bleeding, pharyngeal hematoma as well as other potential complications associated with anesthesia including aspiration, arrhythmia, respiratory failure and death. Alternatives to treatment were discussed, questions were answered. Patient is willing to proceed. Daughter was also present for conversation and together they both agree she agrees to proceed. This is scheduled for 1pm tomorrow with Dr. Stanford Breed. Orders written including NPO after midnight except sips with meds.  Charlie Pitter, PA-C 09/26/2021 4:00 PM

## 2021-09-27 ENCOUNTER — Inpatient Hospital Stay (HOSPITAL_COMMUNITY): Payer: 59

## 2021-09-27 ENCOUNTER — Encounter (HOSPITAL_COMMUNITY): Payer: Self-pay | Admitting: Internal Medicine

## 2021-09-27 ENCOUNTER — Encounter (HOSPITAL_COMMUNITY)
Admission: EM | Disposition: A | Discharge status: Skilled Nursing Facility | Payer: Self-pay | Source: Home / Self Care | Attending: Internal Medicine

## 2021-09-27 ENCOUNTER — Inpatient Hospital Stay (HOSPITAL_COMMUNITY): Payer: 59 | Admitting: Certified Registered"

## 2021-09-27 DIAGNOSIS — I340 Nonrheumatic mitral (valve) insufficiency: Secondary | ICD-10-CM | POA: Diagnosis not present

## 2021-09-27 DIAGNOSIS — N186 End stage renal disease: Secondary | ICD-10-CM | POA: Diagnosis not present

## 2021-09-27 DIAGNOSIS — Z794 Long term (current) use of insulin: Secondary | ICD-10-CM

## 2021-09-27 DIAGNOSIS — R6521 Severe sepsis with septic shock: Secondary | ICD-10-CM | POA: Diagnosis not present

## 2021-09-27 DIAGNOSIS — D735 Infarction of spleen: Secondary | ICD-10-CM | POA: Diagnosis not present

## 2021-09-27 DIAGNOSIS — Z7984 Long term (current) use of oral hypoglycemic drugs: Secondary | ICD-10-CM

## 2021-09-27 DIAGNOSIS — I96 Gangrene, not elsewhere classified: Secondary | ICD-10-CM

## 2021-09-27 DIAGNOSIS — I361 Nonrheumatic tricuspid (valve) insufficiency: Secondary | ICD-10-CM | POA: Diagnosis not present

## 2021-09-27 DIAGNOSIS — E1151 Type 2 diabetes mellitus with diabetic peripheral angiopathy without gangrene: Secondary | ICD-10-CM

## 2021-09-27 DIAGNOSIS — Q2112 Patent foramen ovale: Secondary | ICD-10-CM

## 2021-09-27 DIAGNOSIS — A419 Sepsis, unspecified organism: Secondary | ICD-10-CM

## 2021-09-27 DIAGNOSIS — J9611 Chronic respiratory failure with hypoxia: Secondary | ICD-10-CM | POA: Diagnosis not present

## 2021-09-27 DIAGNOSIS — D72829 Elevated white blood cell count, unspecified: Secondary | ICD-10-CM | POA: Diagnosis not present

## 2021-09-27 DIAGNOSIS — Z992 Dependence on renal dialysis: Secondary | ICD-10-CM

## 2021-09-27 HISTORY — PX: BUBBLE STUDY: SHX6837

## 2021-09-27 HISTORY — PX: TEE WITHOUT CARDIOVERSION: SHX5443

## 2021-09-27 LAB — GLUCOSE, CAPILLARY
Glucose-Capillary: 137 mg/dL — ABNORMAL HIGH (ref 70–99)
Glucose-Capillary: 81 mg/dL (ref 70–99)
Glucose-Capillary: 214 mg/dL — ABNORMAL HIGH (ref 70–99)
Glucose-Capillary: 239 mg/dL — ABNORMAL HIGH (ref 70–99)
Glucose-Capillary: 213 mg/dL — ABNORMAL HIGH (ref 70–99)

## 2021-09-27 LAB — CULTURE, BLOOD (ROUTINE X 2): Special Requests: ADEQUATE

## 2021-09-27 LAB — CBC
HCT: 27.7 % — ABNORMAL LOW (ref 36.0–46.0)
Hemoglobin: 8 g/dL — ABNORMAL LOW (ref 12.0–15.0)
MCH: 27.6 pg (ref 26.0–34.0)
MCHC: 28.9 g/dL — ABNORMAL LOW (ref 30.0–36.0)
MCV: 95.5 fL (ref 80.0–100.0)
Platelets: 290 10*3/uL (ref 150–400)
RBC: 2.9 MIL/uL — ABNORMAL LOW (ref 3.87–5.11)
RDW: 20.6 % — ABNORMAL HIGH (ref 11.5–15.5)
WBC: 28.5 10*3/uL — ABNORMAL HIGH (ref 4.0–10.5)
nRBC: 0.8 % — ABNORMAL HIGH (ref 0.0–0.2)

## 2021-09-27 LAB — PREPARE RBC (CROSSMATCH)

## 2021-09-27 SURGERY — ECHOCARDIOGRAM, TRANSESOPHAGEAL
Anesthesia: General

## 2021-09-27 MED ORDER — PHENYLEPHRINE HCL-NACL 20-0.9 MG/250ML-% IV SOLN
INTRAVENOUS | Status: DC | PRN
  Administered 2021-09-27: 50 ug/min via INTRAVENOUS

## 2021-09-27 MED ORDER — HEPARIN (PORCINE) 25000 UT/250ML-% IV SOLN
1800.0000 [IU]/h | INTRAVENOUS | Status: AC
  Administered 2021-09-27: 2000 [IU]/h via INTRAVENOUS
  Administered 2021-09-28 (×2): 1800 [IU]/h via INTRAVENOUS
  Filled 2021-09-27 (×5): qty 250

## 2021-09-27 MED ORDER — IOHEXOL 9 MG/ML PO SOLN
ORAL | Status: AC
  Filled 2021-09-27: qty 1000

## 2021-09-27 MED ORDER — SUCCINYLCHOLINE CHLORIDE 200 MG/10ML IV SOSY
PREFILLED_SYRINGE | INTRAVENOUS | Status: DC | PRN
  Administered 2021-09-27: 140 mg via INTRAVENOUS

## 2021-09-27 MED ORDER — INSULIN ASPART 100 UNIT/ML IJ SOLN
1.0000 [IU] | INTRAMUSCULAR | Status: DC
  Administered 2021-09-27 – 2021-09-28 (×3): 3 [IU] via SUBCUTANEOUS
  Administered 2021-09-28 – 2021-09-30 (×4): 2 [IU] via SUBCUTANEOUS
  Administered 2021-09-30: 1 [IU] via SUBCUTANEOUS
  Administered 2021-09-30: 2 [IU] via SUBCUTANEOUS

## 2021-09-27 MED ORDER — ESMOLOL HCL 100 MG/10ML IV SOLN
INTRAVENOUS | Status: DC | PRN
  Administered 2021-09-27: 10 mg via INTRAVENOUS
  Administered 2021-09-27: 20 mg via INTRAVENOUS

## 2021-09-27 MED ORDER — SODIUM CHLORIDE 0.9% IV SOLUTION: Freq: Once | INTRAVENOUS | Status: DC

## 2021-09-27 MED ORDER — PROPOFOL 10 MG/ML IV BOLUS
INTRAVENOUS | Status: DC | PRN
  Administered 2021-09-27: 100 mg via INTRAVENOUS

## 2021-09-27 MED ORDER — SODIUM CHLORIDE 0.9 % IV BOLUS: 250.0000 mL | INTRAVENOUS | Status: DC

## 2021-09-27 MED ORDER — ALBUMIN HUMAN 5 % IV SOLN
12.5000 g | Freq: Once | INTRAVENOUS | Status: AC
  Administered 2021-09-27: 12.5 g via INTRAVENOUS

## 2021-09-27 MED ORDER — PHENYLEPHRINE 80 MCG/ML (10ML) SYRINGE FOR IV PUSH (FOR BLOOD PRESSURE SUPPORT)
PREFILLED_SYRINGE | INTRAVENOUS | Status: DC | PRN
  Administered 2021-09-27 (×3): 80 ug via INTRAVENOUS

## 2021-09-27 MED ORDER — IOHEXOL 9 MG/ML PO SOLN
ORAL | Status: AC
  Filled 2021-09-27: qty 500

## 2021-09-27 MED ORDER — PHENYLEPHRINE HCL-NACL 20-0.9 MG/250ML-% IV SOLN
0.0000 ug/min | INTRAVENOUS | Status: DC
  Administered 2021-09-27: 60 ug/min via INTRAVENOUS
  Administered 2021-09-28: 40 ug/min via INTRAVENOUS
  Filled 2021-09-27 (×2): qty 250

## 2021-09-27 NOTE — TOC Progression Note (Signed)
Transition of Care [facility]) - Progression Note    Patient Details  Name: Courtney Gray MRN: [id number] Date of Birth: Oct 16, 1970  [facility] CM/SW Contact  Carley Hammed, [place] Phone Number: 09/27/2021, 11:25 AM  Clinical Narrative:    CSW followed up on referral that was sent to [facility] to request LT placement. VM was left for admissions Liaison. TOC will continue to follow for placement needs.   Expected Discharge Plan: Skilled Nursing Facility Barriers to Discharge: Continued Medical Work up  Expected Discharge Plan and Services Expected Discharge Plan: Skilled Nursing Facility In-house Referral: Clinical Social Work Discharge Planning Services: CM Consult Post Acute Care Choice: Durable Medical Equipment Living arrangements for the past 2 months: Single Family Home                                       Social Determinants of Health (SDOH) Interventions    Readmission Risk Interventions    09/16/2021    4:36 PM 08/28/2021   11:50 AM 07/12/2021   12:38 PM  Readmission Risk Prevention Plan  Transportation Screening Complete Complete Complete  Medication Review Oceanographer)  Complete Complete  PCP or Specialist appointment within 3-5 days of discharge  Complete Complete  HRI or Home Care Consult Complete Complete Complete  SW Recovery Care/Counseling Consult Complete Complete Complete  Palliative Care Screening  Not Applicable Not Applicable  Skilled Nursing Facility Complete Not Applicable Complete

## 2021-09-27 NOTE — Progress Notes (Addendum)
[facility] Progress Note   Subjective: HD yesterday, rec'd 1 unit PRBCs 09/26/2021. No repeat CBC done. Will order.  Sepsis W/U in progress. Still awaiting ID of GNR. Refusing contrast for CTA, scheduled to TEE today. Next HD 09/28/2021. Concerned whether or not we should remove TDC for line holiday-will await direction from ID.   Seen in room. Now with chills, rigor. HR 119. Temp 99.7. 1 unit of PRBCs infusing but no repeat CBC as noted above. Chills present prior to starting transfusion per RN. Blood stopped until labs obtained.   Objective Vitals:   09/26/21 2054 09/27/21 0558 09/27/21 0800 09/27/21 0945  BP: (!) 113/53 (!) 152/76  (!) 154/104  Pulse: 97 (!) 106 (!) 105 (!) 109  Resp: 20 (!) 22 20 20   Temp: 99.5 F (37.5 C) 99.3 F (37.4 C)  99.5 F (37.5 C)  TempSrc: Oral Oral  Oral  SpO2: 98%   98%  Weight:      Height:       Physical Exam General: Chronically ill appearing morbidly obese female in NAD Heart:S1,S2 slightly distant (body habitus). No M/R/G  Lungs: CTAB slightly decreased in bases Abdomen: obese, soft, NABS Extremities:R AKA difficult to assess for stump edema as pannus in on top of stump but looks OK. LLE with trace-1+ edema. Ace Wrap L hand.  Dialysis Access: RIJ TDC drsg intact IV Access: LSC Triple lumen SVL   Additional Objective Labs: Basic Metabolic Panel: Recent Labs  Lab 09/24/21 0850 09/25/21 0450 09/26/21 0728  NA 132* 132* 133*  K 4.1 3.9 4.4  CL 96* 96* 95*  CO2 25 26 22   GLUCOSE 141* 117* 153*  BUN 52* 40* 55*  CREATININE 7.95* 6.37* 7.79*  CALCIUM 8.2* 7.8* 8.3*  PHOS 4.8*  --  5.0*   Liver Function Tests: Recent Labs  Lab 09/24/21 0850 09/26/21 0728  ALBUMIN 1.7* 1.7*   No results for input(s): "LIPASE", "AMYLASE" in the last 168 hours. CBC: Recent Labs  Lab 09/21/21 0521 09/23/21 0712 09/24/21 0850 09/25/21 0450 09/26/21 0728  WBC 36.4* 36.2* 32.0* 29.8* 30.2*  HGB 8.0* 8.0* 7.5* 7.1* 6.9*  HCT  27.6* 27.4* 26.4* 25.0* 24.6*  MCV 94.2 96.5 96.7 97.3 97.2  PLT 266 301 305 282 275   Blood Culture    Component Value Date/Time   SDES BLOOD SITE NOT SPECIFIED 09/21/2021 0836   SPECREQUEST  09/21/2021 0836    BOTTLES DRAWN AEROBIC AND ANAEROBIC Blood Culture adequate volume   CULT ANAEROBIC GRAM NEGATIVE ROD (A) 09/21/2021 0836   REPTSTATUS PENDING 09/21/2021 0836    Cardiac Enzymes: No results for input(s): "CKTOTAL", "CKMB", "CKMBINDEX", "TROPONINI" in the last 168 hours. CBG: Recent Labs  Lab 09/21/21 1632 09/21/21 2230 09/22/21 0748 09/23/21 1836 09/23/21 2101  GLUCAP 162* 245* 170* 130* 136*   Iron Studies: No results for input(s): "IRON", "TIBC", "TRANSFERRIN", "FERRITIN" in the last 72 hours. @lablastinr3 @ Studies/Results: No results found. Medications:  sodium chloride 250 mL (09/26/21 2256)   sodium chloride     ceFEPime (MAXIPIME) IV Stopped (09/26/21 1858)   metronidazole 100 mL/hr at 09/27/21 0017    sodium chloride   Intravenous Once   apixaban  5 mg Oral BID   aspirin EC  81 mg Oral Daily   atorvastatin  10 mg Oral QHS   calcitRIOL  1 mcg Oral Q T,Th,Sa-HD   calcium carbonate  1,500 mg Oral TID with meals   cinacalcet  30 mg Oral Q T,Th,Sa-HD   darbepoetin (  ARANESP) injection - DIALYSIS  200 mcg Intravenous Q Thu-HD   diclofenac Sodium  2 g Topical QID   feeding supplement  1 Container Oral TID BM   gabapentin  100 mg Oral Q12H   gabapentin  200 mg Oral Q T,Th,Sa-HD   Gerhardt's butt cream   Topical BID   iohexol       lactulose  30 g Oral TID   metoCLOPramide (REGLAN) injection  10 mg Intravenous Q6H   multivitamin  1 tablet Oral QHS   pantoprazole  40 mg Oral BID   polyethylene glycol  17 g Oral BID   senna-docusate  1 tablet Oral BID   sodium chloride flush  10-40 mL Intracatheter Q12H   sodium chloride flush  3 mL Intravenous Q12H     Dialysis Orders: [name] TTS 4h , BFR 400, EDW 151 kg, 1K/2.5Ca bath, TDC - 4/29 > HBsAg  neg w/ Abs < 10 Hep 2,600 load and 1,500 hourly-Total dose 9275 units per treatment.  - mircera 200ug q2 due 09/10/21 - calcitriol 1 ug tiw - sensipar 30 mg tiw   Assessment/Plan: Septic shock: On admit, requiring short-term pressors, now off. WBC 30.2 today. BC 09/21/2021 reported GNR 09/25/2021, ID consulted. Awaiting ID of organism and direction from ID to decide on line holiday. Refusing CTA with contrast but now says she will try to drink contrast. On Cefepime.  Per primary.  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 Splenic Infarct/abdominal pain: Initially on heparin drip -> now Eliquis BID. ESRD: Continue HD per usual TTS schedule - next HD 09/28/2021. Use 2.0 K bath. Usual heparin.  HTN/volume:Still above OP EDW. HD 09/26/2021. 3.2 liters removed with HD (charting error.  Spoke with charge RN to discuss with staff.) Continue to maximize volume with HD. Anemia of ESRD: Hgb 6.9. 09/26/2021 Continue Aranesp 200 mcg q Thursday. Transfused 1 unit of PRBCs with HD 09/26/2021.  No repeat CBC. Have stopped blood and ordered CBC. NOTE: patient is having menstrual period but HGB still too low.  Secondary Hyperparathyroidism: CorrCa/Phos ok, on Tums as binder. Continue VDRA + sensipar. Chronic hypoxic respiratory failure: Managed by primary T2DM Morbid Obesity    Rita H. Brown NP-C 09/27/2021, 9:53 AM  [employer] [phone]   Seen and examined independently.  Agree with note and exam as documented above by physician extender and as noted here.  General adult female in bed in no acute distress HEENT normocephalic atraumatic extraocular movements intact sclera anicteric Neck supple trachea midline Lungs clear to auscultation bilaterally normal work of breathing at rest  Heart S1S2 no rub Abdomen soft nontender nondistended Extremities right AKA left leg no pitting edema; ace wrap left hand    Psych normal mood and affect Neuro - alert and oriented x 3 provides hx and follows commands Access RIJ tunn catheter  ESRD - HD per TTS schedule    Bacteremia - continue to follow-up ID recs. Appreciate assistance.  Note has tunn catheter for HD     Anemia ESRD on aranesp 200 mcg every Thursday. S/p PRBC on 6/22   Chronic hypoxic resp failure - on 3 liters at home and around 4 liters here. stable   PAD -  s/p finger amputation - left middle finger through PIP joint - ACE wrap left hand   Estanislado Emms, MD 09/27/2021 2:19 PM

## 2021-09-27 NOTE — Progress Notes (Addendum)
    Transesophageal Echocardiogram Note  Courtney Gray [id number] [birth year]  Procedure: Transesophageal Echocardiogram Indications: Bacteremia  Procedure Details Consent: Obtained Time Out: Verified patient identification, verified procedure, site/side was marked, verified correct patient position, special equipment/implants available, Radiology Safety Procedures followed,  medications/allergies/relevent history reviewed, required imaging and test results available.  Performed  Medications:  Pt sedated by anesthesia.   Normal LV function; mild biatrial enlargement; calcified mass in RA; no LAA thrombus; ASA; positive saline microcavitation study c/w PFO; mild MR; moderate TR.  Will review images with colleagues; etiology of RA mass unclear.  I have reviewed the patient's images with Dr. Flora Lipps.  Etiology of the right atrial mass remains unclear.  Possible calcified thrombus.  If she improves and can tolerate cardiac MRI this could be performed later to more fully assess.   Complications: No apparent complications Patient did tolerate procedure well.  Olga Millers, MD

## 2021-09-28 DIAGNOSIS — D733 Abscess of spleen: Secondary | ICD-10-CM | POA: Diagnosis not present

## 2021-09-28 DIAGNOSIS — K750 Abscess of liver: Secondary | ICD-10-CM | POA: Diagnosis not present

## 2021-09-28 DIAGNOSIS — R7881 Bacteremia: Secondary | ICD-10-CM | POA: Diagnosis not present

## 2021-09-28 DIAGNOSIS — A419 Sepsis, unspecified organism: Secondary | ICD-10-CM | POA: Diagnosis not present

## 2021-09-28 DIAGNOSIS — D72829 Elevated white blood cell count, unspecified: Secondary | ICD-10-CM | POA: Diagnosis not present

## 2021-09-28 DIAGNOSIS — D735 Infarction of spleen: Secondary | ICD-10-CM | POA: Diagnosis not present

## 2021-09-28 LAB — APTT
aPTT: 73 seconds — ABNORMAL HIGH (ref 24–36)
aPTT: 106 seconds — ABNORMAL HIGH (ref 24–36)

## 2021-09-28 LAB — CBC
HCT: 25.6 % — ABNORMAL LOW (ref 36.0–46.0)
Hemoglobin: 7.3 g/dL — ABNORMAL LOW (ref 12.0–15.0)
MCH: 26.9 pg (ref 26.0–34.0)
MCHC: 28.5 g/dL — ABNORMAL LOW (ref 30.0–36.0)
MCV: 94.5 fL (ref 80.0–100.0)
Platelets: 314 10*3/uL (ref 150–400)
RBC: 2.71 MIL/uL — ABNORMAL LOW (ref 3.87–5.11)
RDW: 20.4 % — ABNORMAL HIGH (ref 11.5–15.5)
WBC: 36.6 10*3/uL — ABNORMAL HIGH (ref 4.0–10.5)
nRBC: 0.4 % — ABNORMAL HIGH (ref 0.0–0.2)

## 2021-09-28 LAB — TYPE AND SCREEN
ABO/RH(D): O POS
Antibody Screen: NEGATIVE
Unit division: 0
Unit division: 0

## 2021-09-28 LAB — RENAL FUNCTION PANEL
Albumin: 1.8 g/dL — ABNORMAL LOW (ref 3.5–5.0)
Anion gap: 11 (ref 5–15)
BUN: 38 mg/dL — ABNORMAL HIGH (ref 6–20)
CO2: 24 mmol/L (ref 22–32)
Calcium: 7.8 mg/dL — ABNORMAL LOW (ref 8.9–10.3)
Chloride: 97 mmol/L — ABNORMAL LOW (ref 98–111)
Creatinine, Ser: 6.21 mg/dL — ABNORMAL HIGH (ref 0.44–1.00)
GFR, Estimated: 8 mL/min — ABNORMAL LOW (ref >60)
Glucose, Bld: 190 mg/dL — ABNORMAL HIGH (ref 70–99)
Phosphorus: 3.9 mg/dL (ref 2.5–4.6)
Potassium: 4.2 mmol/L (ref 3.5–5.1)
Sodium: 132 mmol/L — ABNORMAL LOW (ref 135–145)

## 2021-09-28 LAB — GLUCOSE, CAPILLARY
Glucose-Capillary: 157 mg/dL — ABNORMAL HIGH (ref 70–99)
Glucose-Capillary: 80 mg/dL (ref 70–99)
Glucose-Capillary: 209 mg/dL — ABNORMAL HIGH (ref 70–99)
Glucose-Capillary: 105 mg/dL — ABNORMAL HIGH (ref 70–99)

## 2021-09-28 LAB — CBC WITH DIFFERENTIAL/PLATELET
Abs Immature Granulocytes: 0.7 10*3/uL — ABNORMAL HIGH (ref 0.00–0.07)
Band Neutrophils: 3 %
Basophils Absolute: 0 10*3/uL (ref 0.0–0.1)
Basophils Relative: 0 %
Eosinophils Absolute: 0 10*3/uL (ref 0.0–0.5)
Eosinophils Relative: 0 %
HCT: 25.2 % — ABNORMAL LOW (ref 36.0–46.0)
Hemoglobin: 7.5 g/dL — ABNORMAL LOW (ref 12.0–15.0)
Lymphocytes Relative: 3 %
Lymphs Abs: 1 10*3/uL (ref 0.7–4.0)
MCH: 28.3 pg (ref 26.0–34.0)
MCHC: 29.8 g/dL — ABNORMAL LOW (ref 30.0–36.0)
MCV: 95.1 fL (ref 80.0–100.0)
Metamyelocytes Relative: 1 %
Monocytes Absolute: 1.3 10*3/uL — ABNORMAL HIGH (ref 0.1–1.0)
Monocytes Relative: 4 %
Myelocytes: 1 %
Neutro Abs: 30 10*3/uL — ABNORMAL HIGH (ref 1.7–7.7)
Neutrophils Relative %: 88 %
Platelets: 276 10*3/uL (ref 150–400)
RBC: 2.65 MIL/uL — ABNORMAL LOW (ref 3.87–5.11)
RDW: 20.6 % — ABNORMAL HIGH (ref 11.5–15.5)
Smear Review: ADEQUATE
WBC: 33 10*3/uL — ABNORMAL HIGH (ref 4.0–10.5)
nRBC: 0.3 % — ABNORMAL HIGH (ref 0.0–0.2)

## 2021-09-28 LAB — BPAM RBC
Blood Product Expiration Date: 202307202359
Blood Product Expiration Date: 202307212359
ISSUE DATE / TIME: 202306220851
ISSUE DATE / TIME: 202306230936
Unit Type and Rh: 5100
Unit Type and Rh: 5100

## 2021-09-28 LAB — HEPARIN LEVEL (UNFRACTIONATED): Heparin Unfractionated: >1.1 IU/mL — ABNORMAL HIGH (ref 0.30–0.70)

## 2021-09-28 LAB — LACTIC ACID, PLASMA: Lactic Acid, Venous: 0.9 mmol/L (ref 0.5–1.9)

## 2021-09-28 MED ORDER — HEPARIN SODIUM (PORCINE) 1000 UNIT/ML IJ SOLN
INTRAMUSCULAR | Status: AC
  Filled 2021-09-28: qty 4

## 2021-09-28 MED ORDER — CALCIUM CARBONATE ANTACID 500 MG PO CHEW
1000.0000 mg | CHEWABLE_TABLET | Freq: Three times a day (TID) | ORAL | Status: DC
  Administered 2021-09-28 – 2021-10-02 (×4): 1000 mg via ORAL
  Filled 2021-09-28: qty 2
  Filled 2021-09-28 (×4): qty 5
  Filled 2021-09-28: qty 2
  Filled 2021-09-28: qty 5
  Filled 2021-09-28: qty 2

## 2021-09-29 ENCOUNTER — Inpatient Hospital Stay (HOSPITAL_COMMUNITY): Payer: 59

## 2021-09-29 DIAGNOSIS — D735 Infarction of spleen: Secondary | ICD-10-CM | POA: Diagnosis not present

## 2021-09-29 LAB — RENAL FUNCTION PANEL
Albumin: 1.8 g/dL — ABNORMAL LOW (ref 3.5–5.0)
Anion gap: 10 (ref 5–15)
BUN: 18 mg/dL (ref 6–20)
CO2: 27 mmol/L (ref 22–32)
Calcium: 7.7 mg/dL — ABNORMAL LOW (ref 8.9–10.3)
Chloride: 96 mmol/L — ABNORMAL LOW (ref 98–111)
Creatinine, Ser: 3.97 mg/dL — ABNORMAL HIGH (ref 0.44–1.00)
GFR, Estimated: 13 mL/min — ABNORMAL LOW (ref >60)
Glucose, Bld: 130 mg/dL — ABNORMAL HIGH (ref 70–99)
Phosphorus: 3 mg/dL (ref 2.5–4.6)
Potassium: 3.4 mmol/L — ABNORMAL LOW (ref 3.5–5.1)
Sodium: 133 mmol/L — ABNORMAL LOW (ref 135–145)

## 2021-09-29 LAB — CBC
HCT: 24.4 % — ABNORMAL LOW (ref 36.0–46.0)
Hemoglobin: 7.2 g/dL — ABNORMAL LOW (ref 12.0–15.0)
MCH: 28.3 pg (ref 26.0–34.0)
MCHC: 29.5 g/dL — ABNORMAL LOW (ref 30.0–36.0)
MCV: 96.1 fL (ref 80.0–100.0)
Platelets: 277 10*3/uL (ref 150–400)
RBC: 2.54 MIL/uL — ABNORMAL LOW (ref 3.87–5.11)
RDW: 20.4 % — ABNORMAL HIGH (ref 11.5–15.5)
WBC: 29.4 10*3/uL — ABNORMAL HIGH (ref 4.0–10.5)
nRBC: 0.8 % — ABNORMAL HIGH (ref 0.0–0.2)

## 2021-09-29 LAB — APTT
aPTT: 49 seconds — ABNORMAL HIGH (ref 24–36)
aPTT: 64 seconds — ABNORMAL HIGH (ref 24–36)

## 2021-09-29 LAB — GLUCOSE, CAPILLARY
Glucose-Capillary: 157 mg/dL — ABNORMAL HIGH (ref 70–99)
Glucose-Capillary: 134 mg/dL — ABNORMAL HIGH (ref 70–99)
Glucose-Capillary: 124 mg/dL — ABNORMAL HIGH (ref 70–99)
Glucose-Capillary: 118 mg/dL — ABNORMAL HIGH (ref 70–99)
Glucose-Capillary: 195 mg/dL — ABNORMAL HIGH (ref 70–99)
Glucose-Capillary: 168 mg/dL — ABNORMAL HIGH (ref 70–99)

## 2021-09-29 MED ORDER — FENTANYL CITRATE (PF) 100 MCG/2ML IJ SOLN
INTRAMUSCULAR | Status: AC
  Filled 2021-09-29: qty 2

## 2021-09-29 MED ORDER — MIDAZOLAM HCL 2 MG/2ML IJ SOLN
INTRAMUSCULAR | Status: AC
  Filled 2021-09-29: qty 2

## 2021-09-29 MED ORDER — HEPARIN (PORCINE) 25000 UT/250ML-% IV SOLN
2150.0000 [IU]/h | INTRAVENOUS | Status: DC
  Administered 2021-09-29: 1800 [IU]/h via INTRAVENOUS
  Administered 2021-09-30 – 2021-10-03 (×4): 1950 [IU]/h via INTRAVENOUS
  Administered 2021-10-03 – 2021-10-05 (×5): 2100 [IU]/h via INTRAVENOUS
  Administered 2021-10-07 – 2021-10-08 (×3): 2150 [IU]/h via INTRAVENOUS
  Administered 2021-10-08: 2100 [IU]/h via INTRAVENOUS
  Administered 2021-10-09: 2150 [IU]/h via INTRAVENOUS
  Filled 2021-09-29 (×19): qty 250

## 2021-09-29 MED ORDER — MIDAZOLAM HCL 2 MG/2ML IJ SOLN
INTRAMUSCULAR | Status: DC | PRN
  Administered 2021-09-29: 1 mg via INTRAVENOUS
  Administered 2021-09-29: .5 mg via INTRAVENOUS

## 2021-09-29 MED ORDER — SODIUM CHLORIDE 0.9% FLUSH
5.0000 mL | Freq: Three times a day (TID) | INTRAVENOUS | Status: DC
  Administered 2021-09-30 – 2021-10-21 (×44): 5 mL

## 2021-09-29 MED ORDER — FENTANYL CITRATE (PF) 100 MCG/2ML IJ SOLN
INTRAMUSCULAR | Status: DC | PRN
  Administered 2021-09-29 (×2): 50 ug via INTRAVENOUS

## 2021-09-29 MED ORDER — LIDOCAINE HCL 1 % IJ SOLN
INTRAMUSCULAR | Status: AC
  Filled 2021-09-29: qty 10

## 2021-09-29 NOTE — Procedures (Signed)
Interventional Radiology Procedure Note  Procedure: CT Guided Drainage of hepatic abscess  Complications: None  Estimated Blood Loss: < 10 mL  Findings: 10 Fr drain placed in left lobe hepatic abscess with return of purulent fluid. Fluid sample sent for culture analysis. Drain attached to suction bulb drainage.  Will follow.  Jodi Marble. Fredia Sorrow, M.D Pager:  [phone]

## 2021-09-29 NOTE — Progress Notes (Signed)
ANTICOAGULATION CONSULT NOTE  Pharmacy Consult for heparin Indication:  splenic infarct  Heparin Dosing Weight: 96.1 kg  Labs: Recent Labs    09/28/21 0151 09/28/21 0933 09/29/21 0056 09/29/21 0504  HGB 7.3* 7.5*  --  7.2*  HCT 25.6* 25.2*  --  24.4*  PLT 314 276  --  277  APTT 73* 106* 49*  --   HEPARINUNFRC >1.10*  --   --   --   CREATININE 6.21*  --   --  3.97*     Assessment: 58 yof with hx PVD s/p R AKA 5/10 for critical limb ischemia, ESRD on HD TTS, found to have splenic infarct and transitioned from heparin infusion to apixaban on 6/14. Pharmacy consulted to transition back to heparin infusion with patient hypotensive requiring transfer to ICU s/p TEE on 6/23. Last dose of apixaban 6/23 at 1048.   She is s/p IR drain placement for hepatic abscess. No bleeding reported, Hgb low in 7s, platelets are normal. Ok to resume IV heparin at noon per Dr Fredia Sorrow.  Noted CT finding of possible subcapsular hematoma.  Will aim for lower goals.  Goal of Therapy:  Heparin level 0.3-0.5 units/ml aPTT 66-85 seconds Monitor platelets by anticoagulation protocol: Yes   Plan:  Resume heparin infusion at 1800 units/hr with no bolus at noon Check 8 hr aPTT Daily heparin level, aPTT and CBC Monitor closely for bleeding/hematoma expansion   Thank you for involving pharmacy in this patient's care.  Loura Back, PharmD, BCPS Clinical Pharmacist Clinical phone for 09/29/2021 until 3p is [phone] 09/29/2021 11:23 AM  **Pharmacist phone directory can be found on [email or web address] listed under [facility]**

## 2021-09-30 ENCOUNTER — Encounter (HOSPITAL_COMMUNITY): Payer: Self-pay | Admitting: Cardiology

## 2021-09-30 DIAGNOSIS — D735 Infarction of spleen: Secondary | ICD-10-CM | POA: Diagnosis not present

## 2021-09-30 DIAGNOSIS — J9611 Chronic respiratory failure with hypoxia: Secondary | ICD-10-CM | POA: Diagnosis not present

## 2021-09-30 DIAGNOSIS — D72829 Elevated white blood cell count, unspecified: Secondary | ICD-10-CM

## 2021-09-30 DIAGNOSIS — R778 Other specified abnormalities of plasma proteins: Secondary | ICD-10-CM | POA: Diagnosis not present

## 2021-09-30 DIAGNOSIS — I96 Gangrene, not elsewhere classified: Secondary | ICD-10-CM | POA: Diagnosis not present

## 2021-09-30 LAB — RENAL FUNCTION PANEL
Albumin: 1.8 g/dL — ABNORMAL LOW (ref 3.5–5.0)
Anion gap: 10 (ref 5–15)
BUN: 27 mg/dL — ABNORMAL HIGH (ref 6–20)
CO2: 27 mmol/L (ref 22–32)
Calcium: 7.5 mg/dL — ABNORMAL LOW (ref 8.9–10.3)
Chloride: 94 mmol/L — ABNORMAL LOW (ref 98–111)
Creatinine, Ser: 5.51 mg/dL — ABNORMAL HIGH (ref 0.44–1.00)
GFR, Estimated: 9 mL/min — ABNORMAL LOW (ref >60)
Glucose, Bld: 106 mg/dL — ABNORMAL HIGH (ref 70–99)
Phosphorus: 4.1 mg/dL (ref 2.5–4.6)
Potassium: 3.4 mmol/L — ABNORMAL LOW (ref 3.5–5.1)
Sodium: 131 mmol/L — ABNORMAL LOW (ref 135–145)

## 2021-09-30 LAB — CBC
HCT: 24.5 % — ABNORMAL LOW (ref 36.0–46.0)
Hemoglobin: 7.1 g/dL — ABNORMAL LOW (ref 12.0–15.0)
MCH: 27.8 pg (ref 26.0–34.0)
MCHC: 29 g/dL — ABNORMAL LOW (ref 30.0–36.0)
MCV: 96.1 fL (ref 80.0–100.0)
Platelets: 274 10*3/uL (ref 150–400)
RBC: 2.55 MIL/uL — ABNORMAL LOW (ref 3.87–5.11)
RDW: 20.1 % — ABNORMAL HIGH (ref 11.5–15.5)
WBC: 25 10*3/uL — ABNORMAL HIGH (ref 4.0–10.5)
nRBC: 0.9 % — ABNORMAL HIGH (ref 0.0–0.2)

## 2021-09-30 LAB — HEPARIN LEVEL (UNFRACTIONATED): Heparin Unfractionated: 1.02 IU/mL — ABNORMAL HIGH (ref 0.30–0.70)

## 2021-09-30 LAB — GLUCOSE, CAPILLARY
Glucose-Capillary: 109 mg/dL — ABNORMAL HIGH (ref 70–99)
Glucose-Capillary: 112 mg/dL — ABNORMAL HIGH (ref 70–99)
Glucose-Capillary: 119 mg/dL — ABNORMAL HIGH (ref 70–99)
Glucose-Capillary: 147 mg/dL — ABNORMAL HIGH (ref 70–99)
Glucose-Capillary: 176 mg/dL — ABNORMAL HIGH (ref 70–99)
Glucose-Capillary: 179 mg/dL — ABNORMAL HIGH (ref 70–99)

## 2021-09-30 LAB — APTT
aPTT: 85 seconds — ABNORMAL HIGH (ref 24–36)
aPTT: 72 seconds — ABNORMAL HIGH (ref 24–36)

## 2021-09-30 MED ORDER — HEPARIN SODIUM (PORCINE) 1000 UNIT/ML DIALYSIS: 9500.0000 [IU] | Freq: Once | INTRAMUSCULAR | Status: DC

## 2021-09-30 MED ORDER — INSULIN ASPART 100 UNIT/ML IJ SOLN
1.0000 [IU] | Freq: Three times a day (TID) | INTRAMUSCULAR | Status: DC
  Administered 2021-10-01 – 2021-10-05 (×3): 1 [IU] via SUBCUTANEOUS
  Administered 2021-10-07 – 2021-10-10 (×2): 2 [IU] via SUBCUTANEOUS
  Administered 2021-10-11: 3 [IU] via SUBCUTANEOUS
  Administered 2021-10-12: 2 [IU] via SUBCUTANEOUS
  Administered 2021-10-13: 1 [IU] via SUBCUTANEOUS

## 2021-09-30 NOTE — Progress Notes (Signed)
Progress Note  3 Days Post-Op  Subjective: Reports feeling "out of it". States her abd discomfort is different s/p IR drain placement but she is unable to describe the change. Reports weeks of decreased appetite, nausea, and eating less than usual. 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   Objective: Vital signs in last 24 hours: Temp:  [98.6 F (37 C)-99 F (37.2 C)] 98.7 F (37.1 C) (06/26 0416) Pulse Rate:  [87-100] 87 (06/26 0416) Resp:  [16-19] 18 (06/26 0416) BP: (119-171)/(56-98) 153/77 (06/26 0416) SpO2:  [95 %-99 %] 98 % (06/26 0416) Last BM Date : 09/28/21  Intake/Output from previous day: 06/25 0701 - 06/26 0700 In: 822.8 [P.O.:680; IV Piggyback:142.8] Out: 105 [Drains:105] Intake/Output this shift: No intake/output data recorded.  PE: General: pleasant, WD, morbidly obese female who is laying in bed in NAD HEENT: head is normocephalic, atraumatic.  Sclera are anicteric.  Ears and nose without any masses or lesions.  Mouth is pink and moist Heart: regular, rate, and rhythm.   Lungs: Respiratory effort nonlabored Abd: soft, NT, ND, no masses, hernias, or organomegaly, IR JP drain in RUQ with sanguinous and purulent output in bulb (105 cc/24h) MS: R AKA, L hand in splint  Skin: warm and dry with no masses, lesions, or rashes   Lab Results:  Recent Labs    09/29/21 0504 09/30/21 0500  WBC 29.4* 25.0*  HGB 7.2* 7.1*  HCT 24.4* 24.5*  PLT 277 274   BMET Recent Labs    09/29/21 0504 09/30/21 0500  NA 133* 131*  K 3.4* 3.4*  CL 96* 94*  CO2 27 27  GLUCOSE 130* 106*  BUN 18 27*  CREATININE 3.97* 5.51*  CALCIUM 7.7* 7.5*   PT/INR No results for input(s): "LABPROT", "INR" in the last 72 hours. CMP     Component Value Date/Time   NA 131 (L) 09/30/2021 0500   K 3.4 (L) 09/30/2021 0500   CL 94 (L) 09/30/2021 0500   CO2 27  09/30/2021 0500   GLUCOSE 106 (H) 09/30/2021 0500   BUN 27 (H) 09/30/2021 0500   CREATININE 5.51 (H) 09/30/2021 0500   CALCIUM 7.5 (L) 09/30/2021 0500   PROT 6.0 (L) 09/14/2021 0636   ALBUMIN 1.8 (L) 09/30/2021 0500   AST 16 09/14/2021 0636   ALT 10 09/14/2021 0636   ALKPHOS 124 09/14/2021 0636   BILITOT 0.7 09/14/2021 0636   GFRNONAA 9 (L) 09/30/2021 0500   GFRAA 6 (L) 11/29/2019 0120   Lipase     Component Value Date/Time   LIPASE 37 09/10/2021 0031       Studies/Results: CT IMAGE GUIDED DRAINAGE BY PERCUTANEOUS CATHETER  Result Date: 09/29/2021 CLINICAL DATA:  Hepatic abscess. EXAM: CT GUIDED CATHETER DRAINAGE OF HEPATIC ABSCESS ANESTHESIA/SEDATION: Moderate (conscious) sedation was employed during this procedure. A total of Versed 1.5 mg and Fentanyl 100 mcg was administered intravenously by radiology nurse. Moderate Sedation Time: 10 minutes. The patient's level of consciousness and vital signs were monitored continuously by radiology nursing throughout the procedure under my direct supervision. PROCEDURE: The procedure, risks, benefits, and alternatives were explained to the patient. Questions regarding the procedure were encouraged and answered. The patient understands and consents to the procedure. A time out was performed prior to initiating the procedure. CT was performed through the abdomen in a supine position. The  anterior abdominal wall was prepped with chlorhexidine in a sterile fashion, and a sterile drape was applied covering the operative field. A sterile gown and sterile gloves were used for the procedure. Local anesthesia was provided with 1% Lidocaine. Under CT guidance, an 18 gauge trocar needle was advanced into an abscess in the left lobe of the liver. After confirming needle tip position and aspiration of a small amount of fluid, a guidewire was advanced into the collection, the tract was dilated over the wire and a 10 French percutaneous pigtail drainage catheter  advanced and formed. Drain position was confirmed by CT. A larger fluid sample was then withdrawn from the drain and sent for culture analysis. The drain was flushed with sterile saline and connected to a suction bulb. The drainage catheter was secured at the skin with a Prolene retention suture and overlying sterile dressing. RADIATION DOSE REDUCTION: This exam was performed according to the departmental dose-optimization program which includes automated exposure control, adjustment of the mA and/or kV according to patient size and/or use of iterative reconstruction technique. COMPLICATIONS: None FINDINGS: CT again demonstrates an ill-defined ovoid collection within the left lobe of the liver measuring approximately 6 cm in maximum diameter. After needle puncture, there was return of grossly purulent fluid. After placement of the drain, there is abundant return of purulent fluid. The communicating splenic collections appear similar by CT and may also represent abscesses. IMPRESSION: 1. CT-guided percutaneous catheter drainage of left lobe hepatic abscess yielding purulent fluid. A fluid sample was sent for culture analysis. A 10 French drainage catheter was placed and attached to suction bulb drainage. 2. Rounded communicating splenic collections are similar in appearance to the hepatic collection and are suspicious for splenic abscesses. Electronically Signed   By: Irish Lack M.D.   On: 09/29/2021 13:17    Anti-infectives: Anti-infectives (From admission, onward)    Start     Dose/Rate Route Frequency Ordered Stop   09/25/21 0915  metroNIDAZOLE (FLAGYL) IVPB 500 mg        500 mg 100 mL/hr over 60 Minutes Intravenous Every 12 hours 09/25/21 0822     09/13/21 1000  vancomycin (VANCOCIN) IVPB 1000 mg/200 mL premix        1,000 mg 200 mL/hr over 60 Minutes Intravenous STAT 09/13/21 0904 09/13/21 1140   09/10/21 1200  vancomycin (VANCOCIN) IVPB 1000 mg/200 mL premix  Status:  Discontinued        1,000  mg 200 mL/hr over 60 Minutes Intravenous Every T-Th-Sa (Hemodialysis) 09/10/21 0620 09/17/21 1013   09/10/21 1200  ceFEPIme (MAXIPIME) 2 g in sodium chloride 0.9 % 100 mL IVPB        2 g 200 mL/hr over 30 Minutes Intravenous Every T-Th-Sa (Hemodialysis) 09/10/21 1107     09/10/21 0445  vancomycin (VANCOREADY) IVPB 2000 mg/400 mL        2,000 mg 200 mL/hr over 120 Minutes Intravenous  Once 09/10/21 0435 09/10/21 0800   09/10/21 0430  ceFEPIme (MAXIPIME) 2 g in sodium chloride 0.9 % 100 mL IVPB        2 g 200 mL/hr over 30 Minutes Intravenous  Once 09/10/21 0420 09/10/21 0550        Assessment/Plan Sepsis secondary to Bacteremia - bacteroids thetaiotaomicron, beta lactamase + Splenic infarcts Hepatic and splenic abscesses  - CT 6/23 with 5.1 x 5.7 x 6.1 cm hypodense area in the subcapsular left lobe of the liver and new rounded subcapsular low-density region measuring 5.9 x 10.0 x 8.9  cm. Findings may represent subcapsular hematoma given recent infarcts, but splenic abscess is also in the differential  - WBC 33 > 29 > 25 downtrending, hemodynamically stable, Tmax in last 24h 100.3 - no abdominal ttp on exam or peritonitis - s/p IR drainage of hepatic abscess 6/25 with purulent fluid, Cxs sent  -  I do not see a role for acute surgical intervention at this time. Antibiotics per ID. We will sign off. Please call as needed.   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 Frazier Butt ESRD on iHD TTSa Anemia of chronic illness Multiple pressure wounds Hx of HTN Hx of HLD Physical debility  T2DM Hx of gout    I reviewed Consultant IR notes, last 24 h vitals and pain scores, last 48 h intake and output, last 24 h labs and trends, last 24 h imaging results, and CCM notes .   LOS: 20 days       Adam Phenix, [facility] 09/30/2021, 9:59 AM Please see Amion for pager number during day hours 7:00am-4:30pm

## 2021-09-30 NOTE — Progress Notes (Addendum)
 [facility] Progress Note   Subjective:Seen in room.Sleeping, somewhat difficult to awaken. Says she's out of it.  Events of weekend noted. Next HD 10/01/2021 on schedule.   Objective Vitals:   09/29/21 1115 09/29/21 1253 09/29/21 2022 09/30/21 0416  BP: 137/66 (!) 154/56 119/63 (!) 153/77  Pulse: 99 93 90 87  Resp: 17 16 17 18   Temp:  98.6 F (37 C) 99 F (37.2 C) 98.7 F (37.1 C)  TempSrc:  Oral Oral Oral  SpO2: 95%  99% 98%  Weight:      Height:       Physical Exam General: Chronically ill appearing morbidly obese female in NAD Heart:S1,S2 slightly distant (body habitus). No M/R/G  Lungs: CTAB slightly decreased in bases Abdomen: obese, soft, NABS Extremities:R AKA difficult to assess for stump edema as pannus in on top of stump but looks OK. LLE with trace-1+ edema. Ace Wrap L hand.  Dialysis Access: RIJ TDC drsg intact IV Access: LSC Triple lumen SVL    Additional Objective Labs: Basic Metabolic Panel: Recent Labs  Lab 09/28/21 0151 09/29/21 0504 09/30/21 0500  NA 132* 133* 131*  K 4.2 3.4* 3.4*  CL 97* 96* 94*  CO2 24 27 27   GLUCOSE 190* 130* 106*  BUN 38* 18 27*  CREATININE 6.21* 3.97* 5.51*  CALCIUM  7.8* 7.7* 7.5*  PHOS 3.9 3.0 4.1   Liver Function Tests: Recent Labs  Lab 09/28/21 0151 09/29/21 0504 09/30/21 0500  ALBUMIN  1.8* 1.8* 1.8*   No results for input(s): LIPASE, AMYLASE in the last 168 hours. CBC: Recent Labs  Lab 09/27/21 1045 09/28/21 0151 09/28/21 0933 09/29/21 0504 09/30/21 0500  WBC 28.5* 36.6* 33.0* 29.4* 25.0*  NEUTROABS  --   --  30.0*  --   --   HGB 8.0* 7.3* 7.5* 7.2* 7.1*  HCT 27.7* 25.6* 25.2* 24.4* 24.5*  MCV 95.5 94.5 95.1 96.1 96.1  PLT 290 314 276 277 274   Blood Culture    Component Value Date/Time   SDES ABSCESS 09/29/2021 1106   SPECREQUEST HEPATIC ABSCESS 09/29/2021 1106   CULT  09/29/2021 1106    NO GROWTH < 24 HOURS Performed at [facility], [address],  [place], [place] [address]    REPTSTATUS PENDING 09/29/2021 1106    Cardiac Enzymes: No results for input(s): CKTOTAL, CKMB, CKMBINDEX, TROPONINI in the last 168 hours. CBG: Recent Labs  Lab 09/29/21 1506 09/29/21 2145 09/30/21 0012 09/30/21 0415 09/30/21 0748  GLUCAP 195* 168* 147* 112* 119*   Iron Studies: No results for input(s): IRON, TIBC, TRANSFERRIN, FERRITIN in the last 72 hours. @lablastinr3 @ Studies/Results: CT IMAGE GUIDED DRAINAGE BY PERCUTANEOUS CATHETER  Result Date: 09/29/2021 CLINICAL DATA:  Hepatic abscess. EXAM: CT GUIDED CATHETER DRAINAGE OF HEPATIC ABSCESS ANESTHESIA/SEDATION: Moderate (conscious) sedation was employed during this procedure. A total of Versed  1.5 mg and Fentanyl  100 mcg was administered intravenously by radiology nurse. Moderate Sedation Time: 10 minutes. The patient's level of consciousness and vital signs were monitored continuously by radiology nursing throughout the procedure under my direct supervision. PROCEDURE: The procedure, risks, benefits, and alternatives were explained to the patient. Questions regarding the procedure were encouraged and answered. The patient understands and consents to the procedure. A time out was performed prior to initiating the procedure. CT was performed through the abdomen in a supine position. The anterior abdominal wall was prepped with chlorhexidine  in a sterile fashion, and a sterile drape was applied covering the operative field. A sterile gown and  sterile gloves were used for the procedure. Local anesthesia was provided with 1% Lidocaine . Under CT guidance, an 18 gauge trocar needle was advanced into an abscess in the left lobe of the liver. After confirming needle tip position and aspiration of a small amount of fluid, a guidewire was advanced into the collection, the tract was dilated over the wire and a 10 French percutaneous pigtail drainage catheter advanced and formed. Drain position was confirmed by  CT. A larger fluid sample was then withdrawn from the drain and sent for culture analysis. The drain was flushed with sterile saline and connected to a suction bulb. The drainage catheter was secured at the skin with a Prolene retention suture and overlying sterile dressing. RADIATION DOSE REDUCTION: This exam was performed according to the departmental dose-optimization program which includes automated exposure control, adjustment of the mA and/or kV according to patient size and/or use of iterative reconstruction technique. COMPLICATIONS: None FINDINGS: CT again demonstrates an ill-defined ovoid collection within the left lobe of the liver measuring approximately 6 cm in maximum diameter. After needle puncture, there was return of grossly purulent fluid. After placement of the drain, there is abundant return of purulent fluid. The communicating splenic collections appear similar by CT and may also represent abscesses. IMPRESSION: 1. CT-guided percutaneous catheter drainage of left lobe hepatic abscess yielding purulent fluid. A fluid sample was sent for culture analysis. A 10 French drainage catheter was placed and attached to suction bulb drainage. 2. Rounded communicating splenic collections are similar in appearance to the hepatic collection and are suspicious for splenic abscesses. Electronically Signed   By: Marcey Moan M.D.   On: 09/29/2021 13:17   Medications:  sodium chloride  10 mL/hr at 09/28/21 1500   ceFEPime  (MAXIPIME ) IV 2 g (09/28/21 1743)   heparin  1,950 Units/hr (09/29/21 2102)   metronidazole  500 mg (09/30/21 0827)    aspirin  EC  81 mg Oral Daily   atorvastatin   10 mg Oral QHS   calcitRIOL   1 mcg Oral Q T,Th,Sa-HD   calcium  carbonate  1,000 mg Oral TID with meals   cinacalcet   30 mg Oral Q T,Th,Sa-HD   darbepoetin (ARANESP ) injection - DIALYSIS  200 mcg Intravenous Q Thu-HD   diclofenac  Sodium  2 g Topical QID   feeding supplement  1 Container Oral TID BM   gabapentin   100 mg  Oral Q12H   gabapentin   200 mg Oral Q T,Th,Sa-HD   Gerhardt's butt cream   Topical BID   insulin  aspart  1-3 Units Subcutaneous Q4H   lactulose   30 g Oral TID   metoCLOPramide  (REGLAN ) injection  10 mg Intravenous Q6H   multivitamin  1 tablet Oral QHS   pantoprazole   40 mg Oral BID   polyethylene glycol  17 g Oral BID   senna-docusate  1 tablet Oral BID   sodium chloride  flush  10-40 mL Intracatheter Q12H   sodium chloride  flush  3 mL Intravenous Q12H   sodium chloride  flush  5 mL Intracatheter Q8H     Dialysis Orders: [name] TTS 4h , BFR 400, EDW 151 kg, 1K/2.5Ca bath, TDC - 4/29 > HBsAg neg w/ Abs < 10 Hep 2,600 load and 1,500 hourly-Total dose 9275 units per treatment.  - mircera 200ug q2 due 09/10/21 - calcitriol  1 ug tiw - sensipar  30 mg tiw   Assessment/Plan: Septic shock: On admit requiring short-term pressors and again after TEE.  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 Splenic Infarct/abdominal pain: anticoagulation per primary team  Splenic/Hepatic abscesses: S/P drain placed in IR 09/29/2021. Per primary ESRD: Continue HD per usual TTS schedule. Next HD 10/01/2021. On heparin  gtt-will hold heparin .  Chronic hypoxic resp failure - note that she is on 3 liters at home HTN/volume:  Optimize UF with HD. She has now reached her old EDW and may need to lower further Anemia of ESRD: Continue Aranesp  200 mcg q Thursday. Transfused 1 unit of PRBCs with HD 09/26/2021.  PRBC's per primary team.  She has acute illness and compounding this is on her menstrual cycle  Secondary Hyperparathyroidism: CorrCa/Phos ok, on Tums as binder. Continue VDRA +  sensipar .   T2DM Morbid Obesity - complicates volume status assessment   Shanette Tamargo H. Egor Fullilove NP-C 09/30/2021, 10:19 AM  [employer] [phone]

## 2021-09-30 NOTE — Progress Notes (Signed)
Progress Note  Patient: Courtney Gray NWG:[id number] DOB: 1970-08-26  DOA: 09/09/2021  DOS: 09/30/2021    Brief hospital course: Courtney Gray is a 51 y.o. 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 She was hypoglycemic, febrile with CT revealing hypoenhancement in the spleen consistent with infarct. Started on IV heparin, empiric antibiotics for septic shock of unclear origin. Later taken for left middle finger amputation 6/19, Ultimately was taken for TEE 6/23 which revealed PFO without vegetation and required transfer to ICU for septic shock found to have hepatic and splenic abscesses. ID has guided IV antibiotics, IR placed hepatic abscess drain 6/25.   Assessment and Plan: Septic shock secondary to bacteremia bacteroids thetaiotaomicron-beta-lactamase positive on 6/17: - Patient with persistent leukocytosis, remains persistent despite left middle finger amputation and no obvious signs of infection was found.   -ID following closely still having fever.  On cefepime, Flagyl    -unclear however the active blood culture from 6/17 was obtained, has a chronic right IJ HD catheter and left IJ central line with very difficult line access. -BP stable.Monitor CBC-WBC trending down  IDT2DM uncontrolled with hypoglycemia: HbA1c 5.7% and recurrently hypoglycemic here.  - Hold insulins. Glucagon prn  Splenic infarct: No embolic source noted on echo or CT's. TEE done on 6/23 shows normal LV function, negative for endocarditis, shows calcified mass in right atrium-etiology unclear, possible calcified thrombus.  Can get cardiac MRI later if she improves for further evaluation.  Hepatic and splenic abscesses: -Status post CT guided drainage of liver abscess by IR on 6/25 with purulent fluid.  Continue TID flushes, monitoring output, call IR for troubleshooting and touch base with them to arrange follow up after discharge.  - Culture no growth < 24 hours. -Patient tolerated procedure well -General surgery recommended no surgical intervention necessary at this time   Sedation related hypotension: -Off of pressors now. -Blood pressure stable this morning   Left third digit ischemic gangrene: Severe PAD with prior AKA -seen by vascular and orthopedics amputated of left third digit 6/19.   -Continue pain control. -Continue aspirin, statin   Left second toe cyanosis cannot rule out early necrosis with extremely poor vasculature orthopedic surgery evaluated the hand follow-up with Dr. Allena Katz for definitive care/possible amputation in the next week late June per their schedule.   Acute on chronic hypoxic aspiratory failure Suspected OHS, OSA: -Respiratory status on 2 to 3 L nasal cannula which is baseline.  '   ESRD on TTS nephrology managing   Anemia multifactorial:  -S/p 2 unit PRBC transfusion 5/23 f and on 09/26/21 w/ HD- -Continue Aranesp injection. Check daily cbc.   Demand ischemia with elevated troponin in the setting of anemia, sepsis, mildly elevated troponin.ACS ruled out.   Hypertension:  Requiring pressors/midodrine, now BP stable - previous on amlodipine and carvedilol -currently on hold consider resuming on discharge   Hyperlipidemia on a statin   GERD continue PPI Acute on chronic ambulatory dysfunction continue PT OT TOC consult for skilled nursing facility placement.     Pressure ulcer stage II left and right buttock POA -Continue local wound care.  Change position every 2 hours   Morbid Obesity:Patient's Body mass index is 56.57 kg/m. : Will benefit with PCP follow-up, weight loss  healthy lifestyle.  Subjective: Wants  to be left alone but is oriented, asks how long the tube will be in. Denies pain at this time. No fevers.   Objective: Vitals:    09/29/21 1253 09/29/21 2022 09/30/21 0416 09/30/21 1142  BP: (!) 154/56 119/63 (!) 153/77 (!) 146/60  Pulse: 93 90 87 84  Resp: 16 17 18 16   Temp: 98.6 F (37 C) 99 F (37.2 C) 98.7 F (37.1 C) 98.4 F (36.9 C)  TempSrc: Oral Oral Oral Oral  SpO2:  99% 98% 99%  Weight:      Height:       Gen: 51 y.o. female in no distress Pulm: Nonlabored breathing supplemental oxygen. Clear, distant. CV: Regular rate and rhythm. No murmur, rub, or gallop. No JVD, no pitting dependent edema. GI: Abdomen soft, obese with tenderness to deep palpation upper quadrants, no rebound or peritonitis. +BS.   Ext: Warm, dry, no deformities Skin: No new rashes, lesions or ulcers on visualized skin. JP drain in upper abdomen with seropurulent discharge. Neuro: Alert and oriented. No focal neurological deficits. Psych: Judgement and insight appear fair. Mood euthymic & affect congruent. Behavior is appropriate.    Data Personally reviewed: CBC: Recent Labs  Lab 09/27/21 1045 09/28/21 0151 09/28/21 0933 09/29/21 0504 09/30/21 0500  WBC 28.5* 36.6* 33.0* 29.4* 25.0*  NEUTROABS  --   --  30.0*  --   --   HGB 8.0* 7.3* 7.5* 7.2* 7.1*  HCT 27.7* 25.6* 25.2* 24.4* 24.5*  MCV 95.5 94.5 95.1 96.1 96.1  PLT 290 314 276 277 274   Basic Metabolic Panel: Recent Labs  Lab 09/24/21 0850 09/25/21 0450 09/26/21 0728 09/28/21 0151 09/29/21 0504 09/30/21 0500  NA 132* 132* 133* 132* 133* 131*  K 4.1 3.9 4.4 4.2 3.4* 3.4*  CL 96* 96* 95* 97* 96* 94*  CO2 25 26 22 24 27 27   GLUCOSE 141* 117* 153* 190* 130* 106*  BUN 52* 40* 55* 38* 18 27*  CREATININE 7.95* 6.37* 7.79* 6.21* 3.97* 5.51*  CALCIUM 8.2* 7.8* 8.3* 7.8* 7.7* 7.5*  PHOS 4.8*  --  5.0* 3.9 3.0 4.1   GFR: Estimated Creatinine Clearance: 18 mL/min (A) (by C-G formula based on SCr of 5.51 mg/dL (H)). Liver Function Tests: Recent Labs  Lab 09/24/21 0850 09/26/21 0728 09/28/21 0151 09/29/21 0504 09/30/21 0500  ALBUMIN 1.7* 1.7* 1.8* 1.8* 1.8*    No results for input(s): "LIPASE", "AMYLASE" in the last 168 hours.  No results for input(s): "AMMONIA" in the last 168 hours. Coagulation Profile: No results for input(s): "INR", "PROTIME" in the last 168 hours.  Cardiac Enzymes: No results for input(s): "CKTOTAL", "CKMB", "CKMBINDEX", "TROPONINI" in the last 168 hours. BNP (last 3 results) No results for input(s): "PROBNP" in the last 8760 hours. HbA1C: No results for input(s): "HGBA1C" in the last 72 hours. CBG: Recent Labs  Lab 09/29/21 2145 09/30/21 0012 09/30/21 0415 09/30/21 0748 09/30/21 1138  GLUCAP 168* 147* 112* 119* 109*   Lipid Profile: No results for input(s): "CHOL", "HDL", "LDLCALC", "TRIG", "CHOLHDL", "LDLDIRECT" in the last 72 hours. Thyroid Function Tests: No results for input(s): "TSH", "T4TOTAL", "FREET4", "T3FREE", "THYROIDAB" in the last 72 hours. Anemia Panel: No results for input(s): "VITAMINB12", "FOLATE", "FERRITIN", "TIBC", "IRON", "RETICCTPCT" in the last 72 hours. Urine analysis: No results found for: "COLORURINE", "APPEARANCEUR", "LABSPEC", "PHURINE", "GLUCOSEU", "HGBUR", "BILIRUBINUR", "KETONESUR", "PROTEINUR", "UROBILINOGEN", "NITRITE", "LEUKOCYTESUR" Recent Results (from the past 240 hour(s))  Culture, blood (Routine X 2) w Reflex to ID Panel     Status: None  Collection Time: 09/21/21  8:28 AM   Specimen: BLOOD  Result Value Ref Range Status   Specimen Description BLOOD SITE NOT SPECIFIED  Final   Special Requests   Final    BOTTLES DRAWN AEROBIC AND ANAEROBIC Blood Culture adequate volume   Culture   Final    NO GROWTH 5 DAYS Performed at [facility], [address], [place], [place] [address]    Report Status 09/26/2021 FINAL  Final  Culture, blood (Routine X 2) w Reflex to ID Panel     Status: Abnormal   Collection Time: 09/21/21  8:36 AM   Specimen: BLOOD  Result Value Ref Range Status   Specimen Description BLOOD SITE NOT SPECIFIED  Final   Special Requests   Final     BOTTLES DRAWN AEROBIC AND ANAEROBIC Blood Culture adequate volume   Culture  Setup Time   Final    GRAM NEGATIVE RODS ANAEROBIC BOTTLE ONLY CRITICAL RESULT CALLED TO, READ BACK BY AND VERIFIED WITHAntoine Primas PHARMD, AT 0805 09/25/21 D. VANHOOK    Culture (A)  Final    BACTEROIDES THETAIOTAOMICRON BETA LACTAMASE POSITIVE Performed at [facility], [address] [place], [place] [address]    Report Status 09/27/2021 FINAL  Final  Blood Culture ID Panel (Reflexed)     Status: None   Collection Time: 09/21/21  8:36 AM  Result Value Ref Range Status   Enterococcus faecalis NOT DETECTED NOT DETECTED Final   Enterococcus Faecium NOT DETECTED NOT DETECTED Final   Listeria monocytogenes NOT DETECTED NOT DETECTED Final   Staphylococcus species NOT DETECTED NOT DETECTED Final   Staphylococcus aureus (BCID) NOT DETECTED NOT DETECTED Final   Staphylococcus epidermidis NOT DETECTED NOT DETECTED Final   Staphylococcus lugdunensis NOT DETECTED NOT DETECTED Final   Streptococcus species NOT DETECTED NOT DETECTED Final   Streptococcus agalactiae NOT DETECTED NOT DETECTED Final   Streptococcus pneumoniae NOT DETECTED NOT DETECTED Final   Streptococcus pyogenes NOT DETECTED NOT DETECTED Final   A.calcoaceticus-baumannii NOT DETECTED NOT DETECTED Final   Bacteroides fragilis NOT DETECTED NOT DETECTED Final   Enterobacterales NOT DETECTED NOT DETECTED Final   Enterobacter cloacae complex NOT DETECTED NOT DETECTED Final   Escherichia coli NOT DETECTED NOT DETECTED Final   Klebsiella aerogenes NOT DETECTED NOT DETECTED Final   Klebsiella oxytoca NOT DETECTED NOT DETECTED Final   Klebsiella pneumoniae NOT DETECTED NOT DETECTED Final   Proteus species NOT DETECTED NOT DETECTED Final   Salmonella species NOT DETECTED NOT DETECTED Final   Serratia marcescens NOT DETECTED NOT DETECTED Final   Haemophilus influenzae NOT DETECTED NOT DETECTED Final   Neisseria meningitidis NOT DETECTED NOT DETECTED  Final   Pseudomonas aeruginosa NOT DETECTED NOT DETECTED Final   Stenotrophomonas maltophilia NOT DETECTED NOT DETECTED Final   Candida albicans NOT DETECTED NOT DETECTED Final   Candida auris NOT DETECTED NOT DETECTED Final   Candida glabrata NOT DETECTED NOT DETECTED Final   Candida krusei NOT DETECTED NOT DETECTED Final   Candida parapsilosis NOT DETECTED NOT DETECTED Final   Candida tropicalis NOT DETECTED NOT DETECTED Final   Cryptococcus neoformans/gattii NOT DETECTED NOT DETECTED Final    Comment: Performed at [facility], [address], [place], [place] [address]  Surgical pcr screen     Status: None   Collection Time: 09/23/21  5:57 PM   Specimen: Nasal Mucosa; Nasal Swab  Result Value Ref Range Status   MRSA, PCR NEGATIVE NEGATIVE Final   Staphylococcus  aureus NEGATIVE NEGATIVE Final    Comment: (NOTE) The Xpert SA Assay (FDA approved for NASAL specimens in patients 27 years of age and older), is one component of a comprehensive surveillance program. It is not intended to diagnose infection nor to guide or monitor treatment. Performed at [facility], [address], [place], [place] [address]   Culture, blood (Routine X 2) w Reflex to ID Panel     Status: None (Preliminary result)   Collection Time: 09/27/21  6:53 PM   Specimen: BLOOD  Result Value Ref Range Status   Specimen Description BLOOD BLOOD RIGHT HAND  Final   Special Requests IN PEDIATRIC BOTTLE Blood Culture adequate volume  Final   Culture   Final    NO GROWTH 3 DAYS Performed at [facility], [address], [place], [place] [address]    Report Status PENDING  Incomplete  Culture, blood (Routine X 2) w Reflex to ID Panel     Status: None (Preliminary result)   Collection Time: 09/27/21  6:53 PM   Specimen: BLOOD  Result Value Ref Range Status   Specimen Description BLOOD BLOOD RIGHT ARM  Final   Special Requests   Final    BOTTLES DRAWN AEROBIC AND ANAEROBIC Blood Culture adequate  volume   Culture   Final    NO GROWTH 3 DAYS Performed at [facility], [address] [place], [place] [address]    Report Status PENDING  Incomplete  Aerobic/Anaerobic Culture w Gram Stain (surgical/deep wound)     Status: None (Preliminary result)   Collection Time: 09/29/21 11:06 AM   Specimen: Abscess  Result Value Ref Range Status   Specimen Description ABSCESS  Final   Special Requests HEPATIC ABSCESS  Final   Gram Stain   Final    ABUNDANT WBC PRESENT,BOTH PMN AND MONONUCLEAR NO ORGANISMS SEEN    Culture   Final    NO GROWTH < 24 HOURS Performed at [facility], [address], [place], [place] [address]    Report Status PENDING  Incomplete     CT IMAGE GUIDED DRAINAGE BY PERCUTANEOUS CATHETER  Result Date: 09/29/2021 CLINICAL DATA:  Hepatic abscess. EXAM: CT GUIDED CATHETER DRAINAGE OF HEPATIC ABSCESS ANESTHESIA/SEDATION: Moderate (conscious) sedation was employed during this procedure. A total of Versed 1.5 mg and Fentanyl 100 mcg was administered intravenously by radiology nurse. Moderate Sedation Time: 10 minutes. The patient's level of consciousness and vital signs were monitored continuously by radiology nursing throughout the procedure under my direct supervision. PROCEDURE: The procedure, risks, benefits, and alternatives were explained to the patient. Questions regarding the procedure were encouraged and answered. The patient understands and consents to the procedure. A time out was performed prior to initiating the procedure. CT was performed through the abdomen in a supine position. The anterior abdominal wall was prepped with chlorhexidine in a sterile fashion, and a sterile drape was applied covering the operative field. A sterile gown and sterile gloves were used for the procedure. Local anesthesia was provided with 1% Lidocaine. Under CT guidance, an 18 gauge trocar needle was advanced into an abscess in the left lobe of the liver. After confirming needle  tip position and aspiration of a small amount of fluid, a guidewire was advanced into the collection, the tract was dilated over the wire and a 10 French percutaneous pigtail drainage catheter advanced and formed. Drain position was confirmed by CT. A larger fluid sample was then withdrawn from the  drain and sent for culture analysis. The drain was flushed with sterile saline and connected to a suction bulb. The drainage catheter was secured at the skin with a Prolene retention suture and overlying sterile dressing. RADIATION DOSE REDUCTION: This exam was performed according to the departmental dose-optimization program which includes automated exposure control, adjustment of the mA and/or kV according to patient size and/or use of iterative reconstruction technique. COMPLICATIONS: None FINDINGS: CT again demonstrates an ill-defined ovoid collection within the left lobe of the liver measuring approximately 6 cm in maximum diameter. After needle puncture, there was return of grossly purulent fluid. After placement of the drain, there is abundant return of purulent fluid. The communicating splenic collections appear similar by CT and may also represent abscesses. IMPRESSION: 1. CT-guided percutaneous catheter drainage of left lobe hepatic abscess yielding purulent fluid. A fluid sample was sent for culture analysis. A 10 French drainage catheter was placed and attached to suction bulb drainage. 2. Rounded communicating splenic collections are similar in appearance to the hepatic collection and are suspicious for splenic abscesses. Electronically Signed   By: Irish Lack M.D.   On: 09/29/2021 13:17     Family Communication: None at bedside  Disposition: Status is: Inpatient Remains inpatient appropriate because: Remains critically ill Planned Discharge Destination:  TBD  - SNF vs. LTC.  Tyrone Nine, MD 09/30/2021 3:48 PM Page by [email or web address]

## 2021-10-01 DIAGNOSIS — R7881 Bacteremia: Secondary | ICD-10-CM | POA: Diagnosis not present

## 2021-10-01 DIAGNOSIS — E1122 Type 2 diabetes mellitus with diabetic chronic kidney disease: Secondary | ICD-10-CM

## 2021-10-01 DIAGNOSIS — I96 Gangrene, not elsewhere classified: Secondary | ICD-10-CM | POA: Diagnosis not present

## 2021-10-01 DIAGNOSIS — J9611 Chronic respiratory failure with hypoxia: Secondary | ICD-10-CM | POA: Diagnosis not present

## 2021-10-01 DIAGNOSIS — D735 Infarction of spleen: Secondary | ICD-10-CM | POA: Diagnosis not present

## 2021-10-01 DIAGNOSIS — K750 Abscess of liver: Secondary | ICD-10-CM

## 2021-10-01 DIAGNOSIS — R778 Other specified abnormalities of plasma proteins: Secondary | ICD-10-CM | POA: Diagnosis not present

## 2021-10-01 DIAGNOSIS — D72829 Elevated white blood cell count, unspecified: Secondary | ICD-10-CM | POA: Diagnosis not present

## 2021-10-01 LAB — PREPARE RBC (CROSSMATCH)

## 2021-10-01 LAB — GLUCOSE, CAPILLARY
Glucose-Capillary: 137 mg/dL — ABNORMAL HIGH (ref 70–99)
Glucose-Capillary: 169 mg/dL — ABNORMAL HIGH (ref 70–99)

## 2021-10-01 LAB — RENAL FUNCTION PANEL
Albumin: 1.8 g/dL — ABNORMAL LOW (ref 3.5–5.0)
Anion gap: 10 (ref 5–15)
BUN: 34 mg/dL — ABNORMAL HIGH (ref 6–20)
CO2: 25 mmol/L (ref 22–32)
Calcium: 7.9 mg/dL — ABNORMAL LOW (ref 8.9–10.3)
Chloride: 97 mmol/L — ABNORMAL LOW (ref 98–111)
Creatinine, Ser: 6.79 mg/dL — ABNORMAL HIGH (ref 0.44–1.00)
GFR, Estimated: 7 mL/min — ABNORMAL LOW (ref >60)
Glucose, Bld: 105 mg/dL — ABNORMAL HIGH (ref 70–99)
Phosphorus: 5.3 mg/dL — ABNORMAL HIGH (ref 2.5–4.6)
Potassium: 3.6 mmol/L (ref 3.5–5.1)
Sodium: 132 mmol/L — ABNORMAL LOW (ref 135–145)

## 2021-10-01 LAB — CBC
HCT: 26.2 % — ABNORMAL LOW (ref 36.0–46.0)
Hemoglobin: 7.3 g/dL — ABNORMAL LOW (ref 12.0–15.0)
MCH: 27.3 pg (ref 26.0–34.0)
MCHC: 27.9 g/dL — ABNORMAL LOW (ref 30.0–36.0)
MCV: 98.1 fL (ref 80.0–100.0)
Platelets: 323 10*3/uL (ref 150–400)
RBC: 2.67 MIL/uL — ABNORMAL LOW (ref 3.87–5.11)
RDW: 20 % — ABNORMAL HIGH (ref 11.5–15.5)
WBC: 26.9 10*3/uL — ABNORMAL HIGH (ref 4.0–10.5)
nRBC: 0.7 % — ABNORMAL HIGH (ref 0.0–0.2)

## 2021-10-01 LAB — HEPARIN LEVEL (UNFRACTIONATED): Heparin Unfractionated: 0.57 IU/mL (ref 0.30–0.70)

## 2021-10-01 LAB — APTT: aPTT: 69 seconds — ABNORMAL HIGH (ref 24–36)

## 2021-10-01 MED ORDER — HEPARIN SODIUM (PORCINE) 1000 UNIT/ML IJ SOLN
INTRAMUSCULAR | Status: AC
  Filled 2021-10-01: qty 10

## 2021-10-01 MED ORDER — MEGESTROL ACETATE 40 MG PO TABS
40.0000 mg | ORAL_TABLET | Freq: Two times a day (BID) | ORAL | Status: DC
  Administered 2021-10-02 – 2021-10-06 (×6): 40 mg via ORAL
  Filled 2021-10-01 (×13): qty 1

## 2021-10-01 MED ORDER — HEPARIN SODIUM (PORCINE) 1000 UNIT/ML DIALYSIS: 1000.0000 [IU] | INTRAMUSCULAR | Status: DC | PRN

## 2021-10-01 MED ORDER — ANTICOAGULANT SODIUM CITRATE 4% (200MG/5ML) IV SOLN
5.0000 mL | Status: DC | PRN
  Filled 2021-10-01: qty 5

## 2021-10-01 MED ORDER — ALTEPLASE 2 MG IJ SOLR: 2.0000 mg | Freq: Once | INTRAMUSCULAR | Status: DC | PRN

## 2021-10-01 MED ORDER — SODIUM CHLORIDE 0.9% IV SOLUTION
Freq: Once | INTRAVENOUS | Status: AC
Start: 2021-10-01 — End: 2021-10-01

## 2021-10-01 MED ORDER — HEPARIN SODIUM (PORCINE) 1000 UNIT/ML IJ SOLN
INTRAMUSCULAR | Status: AC
  Administered 2021-10-01: 1000 [IU]
  Filled 2021-10-01: qty 4

## 2021-10-01 NOTE — Progress Notes (Signed)
 [facility] Progress Note   Subjective: Seen on HD. Says she is tired of procedures. Emotional support to patient. Says she is NOT feeling better. Tolerating HD without issues.   Objective Vitals:   10/01/21 0701 10/01/21 0738 10/01/21 0811 10/01/21 0906  BP:  (!) 132/49 (!) 127/104 (!) 141/68  Pulse:  81 81 83  Resp:   18 18  Temp:      TempSrc:      SpO2:   100% 100%  Weight: (!) 154.2 kg     Height:       Physical Exam General: Chronically ill appearing morbidly obese female in NAD Heart:S1,S2 slightly distant (body habitus). No M/R/G  Lungs: CTAB slightly decreased in bases Abdomen: obese, soft, NABS. Mid line drain to bulb suction. Drsg intact.  Extremities:R AKA difficult to assess for stump edema as pannus in on top of stump but looks OK. LLE with trace-1+ edema. Ace Wrap L hand. Necrotic 2nd L toe.  Dialysis Access: RIJ TDC drsg intact IV Access: LSC Triple lumen SVL      Additional Objective Labs: Basic Metabolic Panel: Recent Labs  Lab 09/29/21 0504 09/30/21 0500 10/01/21 0706  NA 133* 131* 132*  K 3.4* 3.4* 3.6  CL 96* 94* 97*  CO2 27 27 25   GLUCOSE 130* 106* 105*  BUN 18 27* 34*  CREATININE 3.97* 5.51* 6.79*  CALCIUM  7.7* 7.5* 7.9*  PHOS 3.0 4.1 5.3*   Liver Function Tests: Recent Labs  Lab 09/29/21 0504 09/30/21 0500 10/01/21 0706  ALBUMIN  1.8* 1.8* 1.8*   No results for input(s): LIPASE, AMYLASE in the last 168 hours. CBC: Recent Labs  Lab 09/28/21 0151 09/28/21 0933 09/29/21 0504 09/30/21 0500 10/01/21 0706  WBC 36.6* 33.0* 29.4* 25.0* 26.9*  NEUTROABS  --  30.0*  --   --   --   HGB 7.3* 7.5* 7.2* 7.1* 7.3*  HCT 25.6* 25.2* 24.4* 24.5* 26.2*  MCV 94.5 95.1 96.1 96.1 98.1  PLT 314 276 277 274 323   Blood Culture    Component Value Date/Time   SDES ABSCESS 09/29/2021 1106   SPECREQUEST HEPATIC ABSCESS 09/29/2021 1106   CULT  09/29/2021 1106    NO GROWTH 2 DAYS Performed at [facility] Lab, [address] [place], [place] [address]    REPTSTATUS PENDING 09/29/2021 1106    Cardiac Enzymes: No results for input(s): CKTOTAL, CKMB, CKMBINDEX, TROPONINI in the last 168 hours. CBG: Recent Labs  Lab 09/30/21 0415 09/30/21 0748 09/30/21 1138 09/30/21 1553 09/30/21 2035  GLUCAP 112* 119* 109* 179* 176*   Iron Studies: No results for input(s): IRON, TIBC, TRANSFERRIN, FERRITIN in the last 72 hours. @lablastinr3 @ Studies/Results: CT IMAGE GUIDED DRAINAGE BY PERCUTANEOUS CATHETER  Result Date: 09/29/2021 CLINICAL DATA:  Hepatic abscess. EXAM: CT GUIDED CATHETER DRAINAGE OF HEPATIC ABSCESS ANESTHESIA/SEDATION: Moderate (conscious) sedation was employed during this procedure. A total of Versed  1.5 mg and Fentanyl  100 mcg was administered intravenously by radiology nurse. Moderate Sedation Time: 10 minutes. The patient's level of consciousness and vital signs were monitored continuously by radiology nursing throughout the procedure under my direct supervision. PROCEDURE: The procedure, risks, benefits, and alternatives were explained to the patient. Questions regarding the procedure were encouraged and answered. The patient understands and consents to the procedure. A time out was performed prior to initiating the procedure. CT was performed through the abdomen in a supine position. The anterior abdominal wall was prepped with chlorhexidine  in a sterile fashion, and a sterile drape  was applied covering the operative field. A sterile gown and sterile gloves were used for the procedure. Local anesthesia was provided with 1% Lidocaine . Under CT guidance, an 18 gauge trocar needle was advanced into an abscess in the left lobe of the liver. After confirming needle tip position and aspiration of a small amount of fluid, a guidewire was advanced into the collection, the tract was dilated over the wire and a 10 French percutaneous pigtail drainage catheter advanced and formed. Drain position was  confirmed by CT. A larger fluid sample was then withdrawn from the drain and sent for culture analysis. The drain was flushed with sterile saline and connected to a suction bulb. The drainage catheter was secured at the skin with a Prolene retention suture and overlying sterile dressing. RADIATION DOSE REDUCTION: This exam was performed according to the departmental dose-optimization program which includes automated exposure control, adjustment of the mA and/or kV according to patient size and/or use of iterative reconstruction technique. COMPLICATIONS: None FINDINGS: CT again demonstrates an ill-defined ovoid collection within the left lobe of the liver measuring approximately 6 cm in maximum diameter. After needle puncture, there was return of grossly purulent fluid. After placement of the drain, there is abundant return of purulent fluid. The communicating splenic collections appear similar by CT and may also represent abscesses. IMPRESSION: 1. CT-guided percutaneous catheter drainage of left lobe hepatic abscess yielding purulent fluid. A fluid sample was sent for culture analysis. A 10 French drainage catheter was placed and attached to suction bulb drainage. 2. Rounded communicating splenic collections are similar in appearance to the hepatic collection and are suspicious for splenic abscesses. Electronically Signed   By: Marcey Moan M.D.   On: 09/29/2021 13:17   Medications:  sodium chloride  10 mL/hr at 10/01/21 0400   ceFEPime  (MAXIPIME ) IV Stopped (09/28/21 1817)   heparin  1,950 Units/hr (10/01/21 0650)   metronidazole  Stopped (10/01/21 0115)    aspirin  EC  81 mg Oral Daily   atorvastatin   10 mg Oral QHS   calcitRIOL   1 mcg Oral Q T,Th,Sa-HD   calcium  carbonate  1,000 mg Oral TID with meals   cinacalcet   30 mg Oral Q T,Th,Sa-HD   darbepoetin (ARANESP ) injection - DIALYSIS  200 mcg Intravenous Q Thu-HD   diclofenac  Sodium  2 g Topical QID   feeding supplement  1 Container Oral TID BM    gabapentin   100 mg Oral Q12H   gabapentin   200 mg Oral Q T,Th,Sa-HD   Gerhardt's butt cream   Topical BID   insulin  aspart  1-3 Units Subcutaneous TID WC   lactulose   30 g Oral TID   metoCLOPramide  (REGLAN ) injection  10 mg Intravenous Q6H   multivitamin  1 tablet Oral QHS   pantoprazole   40 mg Oral BID   polyethylene glycol  17 g Oral BID   senna-docusate  1 tablet Oral BID   sodium chloride  flush  10-40 mL Intracatheter Q12H   sodium chloride  flush  3 mL Intravenous Q12H   sodium chloride  flush  5 mL Intracatheter Q8H     Dialysis Orders: [name] TTS 4h , BFR 400, EDW 151 kg, 1K/2.5Ca bath, TDC - 4/29 > HBsAg neg w/ Abs < 10 Hep 2,600 load and 1,500 hourly-Total dose 9275 units per treatment.  - mircera 200ug q2 due 09/10/21 - calcitriol  1 ug tiw - sensipar  30 mg tiw   Assessment/Plan: Septic shock: On admit requiring short-term pressors and again after TEE.  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 Splenic Infarct/abdominal pain: anticoagulation per primary team  Splenic/Hepatic abscesses: S/P drain placed in IR 09/29/2021. Per primary ESRD: Continue HD per usual TTS schedule. Next HD 10/03/2021. On heparin  gtt-will hold heparin .  Chronic hypoxic resp failure - note that she is on 3 liters at home HTN/volume:  Optimize UF with HD. She has now reached her old EDW and may need to lower further Anemia of ESRD: HGB low. Continue Aranesp  200 mcg q Thursday. Transfused 1 unit of PRBCs with HD 09/26/2021.  PRBC's per primary team.  She has acute illness and compounding this is on her menstrual cycle  Secondary Hyperparathyroidism: CorrCa/Phos ok, on Tums  as binder. Continue VDRA + sensipar .   T2DM Morbid Obesity - complicates volume status assessment   Aleana Fifita H. Josean Lycan NP-C 10/01/2021, 9:24 AM  [employer] [phone]

## 2021-10-01 NOTE — Progress Notes (Signed)
MD notified of pt's refusing blood transfusion, pt educated but still refuses, pt states she has rights and does not want anymore procedures or medications ordered. Family at bedside.

## 2021-10-01 NOTE — Progress Notes (Signed)
  Hepatic abscess drain placement 09/29/21 Pt in dialysis Doing well No complaints  Drain Location: RUQ Size: Fr size: 10 Fr Date of placement: 09/29/21  Currently to: Drain collection device: suction bulb 24 hour output:  Output by Drain (mL) 09/29/21 0701 - 09/29/21 1900 09/29/21 1901 - 09/30/21 0700 09/30/21 0701 - 09/30/21 1900 09/30/21 1901 - 10/01/21 0700 10/01/21 0701 - 10/01/21 1014  Closed System Drain 1 Right RLQ Bulb (JP) 10 Fr. 85 20  20     Current examination: Flushes/aspirates easily.  Insertion site unremarkable. Suture and stat lock in place. Dressed appropriately.  OP is milky brown 40 cc in last 24 hr  Gram Stain ABUNDANT WBC PRESENT,BOTH PMN AND MONONUCLEAR  NO ORGANISMS SEEN   Culture NO GROWTH 2 DAYS     Plan: Continue TID flushes with 5 cc NS. Record output Q shift. Dressing changes QD or PRN if soiled.  Call IR APP or on call IR MD if difficulty flushing or sudden change in drain output.  Repeat imaging/possible drain injection once output < 10 mL/QD (excluding flush material). Consideration for drain removal if output is < 10 mL/QD (excluding flush material), pending discussion with the providing surgical service.  Discharge planning: Please contact IR APP or on call IR MD prior to patient d/c to ensure appropriate follow up plans are in place. Typically patient will follow up with IR clinic 10-14 days post d/c for repeat imaging/possible drain injection. IR scheduler will contact patient with date/time of appointment. Patient will need to flush drain QD with 5 cc NS, record output QD, dressing changes every 2-3 days or earlier if soiled.   IR will continue to follow - please call with questions or concerns.

## 2021-10-01 NOTE — Progress Notes (Signed)
Progress Note  Patient: Courtney Gray WUJ:[id number] DOB: [birth year]  DOA: 09/09/2021  DOS: 10/01/2021    Brief hospital course: Courtney Gray is a 51 y.o. 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 She was hypoglycemic, febrile with CT revealing hypoenhancement in the spleen consistent with infarct. Started on IV heparin, empiric antibiotics for septic shock of unclear origin. Later taken for left middle finger amputation 6/19, Ultimately was taken for TEE 6/23 which revealed PFO without vegetation and required transfer to ICU for septic shock found to have hepatic and splenic abscesses. ID has guided IV antibiotics, IR placed hepatic abscess drain 6/25.   Assessment and Plan: Septic shock secondary to bacteremia bacteroids thetaiotaomicron-beta-lactamase positive on 6/17: -ID following closely. On cefepime, Flagyl. Persistent leukocytosis. -unclear however the active blood culture from 6/17 was obtained, has a chronic right IJ HD catheter and left IJ central line with very difficult line access.  IDT2DM uncontrolled with hypoglycemia: HbA1c 5.7% and recurrently hypoglycemic here.  - Hold insulins. Glucagon prn  Splenic infarct: No embolic source noted on echo or CT's. TEE done on 6/23 shows normal LV function, negative for endocarditis, shows calcified mass in right atrium-etiology unclear, possible calcified thrombus.  Can get cardiac MRI later if she improves for further evaluation. - Continue heparin. With concern for subcapsular hematoma, aiming for lower end of therapeutic threshold.  Hepatic and splenic abscesses: -Status post CT guided drainage of liver abscess by IR on 6/25 with purulent fluid. Continue TID flushes, monitoring output, call IR for troubleshooting and touch  base with them to arrange follow up after discharge.  - Culture no growth x2 days -General surgery recommended no surgical intervention necessary at this time. If not clinically improving, ?if we should revisit idea of splenectomy.    Sedation related hypotension: -Off of pressors now. -Blood pressure stable this morning   Left third digit ischemic gangrene: Severe PAD with prior AKA -seen by vascular and orthopedics amputated of left third digit 6/19.   -Continue pain control. -Continue aspirin, statin   Left second toe cyanosis cannot rule out early necrosis with extremely poor vasculature orthopedic surgery evaluated the hand follow-up with Dr. Allena Katz for definitive care/possible amputation in the next week late June per their schedule.   Acute on chronic hypoxic aspiratory failure Suspected OHS, OSA: -Respiratory status on 2 to 3 L nasal cannula which is baseline.  '   ESRD on TTS nephrology managing   Anemia multifactorial:  -S/p 2 unit PRBC transfusion 5/23, 09/26/21 w/ HD. Hgb 7.1 > 7.2. Would likely benefit from transfusion of RBCs with demand ischemia and ongoing menstrual bleeding. Ordered 1u.  -Continue Aranesp injection. Check daily cbc.  Menorrhagia: Pt still has 5 day cycles, currently still having some bleeding on day 8.  - Pt declines pelvic U/S, tired of procedures. We will start megace given her transfusion-dependent anemia.    Demand ischemia with elevated troponin in the setting of anemia, sepsis, mildly elevated troponin.ACS ruled out.   Hypertension:  Requiring pressors/midodrine, now BP stable - previous on amlodipine and carvedilol -currently on hold consider resuming on discharge   Hyperlipidemia on a statin   GERD continue PPI Acute on chronic ambulatory dysfunction continue PT OT TOC consult for skilled nursing facility placement.     Pressure  ulcer stage II left and right buttock POA -Continue local wound care.  Change position every 2 hours    Morbid Obesity:Patient's Body mass index is 56.57 kg/m. : Will benefit with PCP follow-up, weight loss  healthy lifestyle.  Subjective: Seen in HD, feeling tired, fatigued, still having menstrual bleeding which is unusual in duration, but she is on a cycle still at age 51. No other bleeding noted. Amenable to discharge.   Objective: Vitals:   10/01/21 0938 10/01/21 1000 10/01/21 1008 10/01/21 1255  BP: 137/76  139/68 (!) 142/78  Pulse: 80 80 79 88  Resp: 18 19 13 14   Temp:      TempSrc:      SpO2:  100%  95%  Weight:      Height:       Gen: Fatigued, pale, obese female in no distress Pulm: Nonlabored breathing supplemental oxygen. Clear anterolaterally. CV: Regular rate and rhythm. No murmur, rub, or gallop. No JVD, no pitting dependent edema. GI: Abdomen soft, tender in upper quadrants without rebound or guarding. No suprapubic tenderness or obvious masses. +BS.  Ext: Warm, no deformities Skin: No new rashes, lesions or ulcers on visualized skin. Midabdominal drain site c/d/I, seropurulent output noted.  Neuro: Alert and oriented. No focal neurological deficits. Psych: Judgement and insight appear fair. Mood euthymic & affect congruent. Behavior is appropriate.     Data Personally reviewed: CBC: Recent Labs  Lab 09/28/21 0151 09/28/21 0933 09/29/21 0504 09/30/21 0500 10/01/21 0706  WBC 36.6* 33.0* 29.4* 25.0* 26.9*  NEUTROABS  --  30.0*  --   --   --   HGB 7.3* 7.5* 7.2* 7.1* 7.3*  HCT 25.6* 25.2* 24.4* 24.5* 26.2*  MCV 94.5 95.1 96.1 96.1 98.1  PLT 314 276 277 274 323   Basic Metabolic Panel: Recent Labs  Lab 09/26/21 0728 09/28/21 0151 09/29/21 0504 09/30/21 0500 10/01/21 0706  NA 133* 132* 133* 131* 132*  K 4.4 4.2 3.4* 3.4* 3.6  CL 95* 97* 96* 94* 97*  CO2 22 24 27 27 25   GLUCOSE 153* 190* 130* 106* 105*  BUN 55* 38* 18 27* 34*  CREATININE 7.79* 6.21* 3.97* 5.51* 6.79*  CALCIUM 8.3* 7.8* 7.7* 7.5* 7.9*  PHOS 5.0* 3.9 3.0 4.1 5.3*   GFR: Estimated  Creatinine Clearance: 14.8 mL/min (A) (by C-G formula based on SCr of 6.79 mg/dL (H)). Liver Function Tests: Recent Labs  Lab 09/26/21 0728 09/28/21 0151 09/29/21 0504 09/30/21 0500 10/01/21 0706  ALBUMIN 1.7* 1.8* 1.8* 1.8* 1.8*   No results for input(s): "LIPASE", "AMYLASE" in the last 168 hours.  No results for input(s): "AMMONIA" in the last 168 hours. Coagulation Profile: No results for input(s): "INR", "PROTIME" in the last 168 hours.  Cardiac Enzymes: No results for input(s): "CKTOTAL", "CKMB", "CKMBINDEX", "TROPONINI" in the last 168 hours. BNP (last 3 results) No results for input(s): "PROBNP" in the last 8760 hours. HbA1C: No results for input(s): "HGBA1C" in the last 72 hours. CBG: Recent Labs  Lab 09/30/21 0415 09/30/21 0748 09/30/21 1138 09/30/21 1553 09/30/21 2035  GLUCAP 112* 119* 109* 179* 176*   Lipid Profile: No results for input(s): "CHOL", "HDL", "LDLCALC", "TRIG", "CHOLHDL", "LDLDIRECT" in the last 72 hours. Thyroid Function Tests: No results for input(s): "TSH", "T4TOTAL", "FREET4", "T3FREE", "THYROIDAB" in the last 72 hours. Anemia Panel: No results for input(s): "VITAMINB12", "FOLATE", "FERRITIN", "TIBC", "IRON", "RETICCTPCT" in the last 72 hours. Urine analysis: No results found for: "COLORURINE", "APPEARANCEUR", "LABSPEC", "PHURINE", "GLUCOSEU", "HGBUR", "BILIRUBINUR", "KETONESUR", "PROTEINUR", "UROBILINOGEN", "  NITRITE", "LEUKOCYTESUR" Recent Results (from the past 240 hour(s))  Surgical pcr screen     Status: None   Collection Time: 09/23/21  5:57 PM   Specimen: Nasal Mucosa; Nasal Swab  Result Value Ref Range Status   MRSA, PCR NEGATIVE NEGATIVE Final   Staphylococcus aureus NEGATIVE NEGATIVE Final    Comment: (NOTE) The Xpert SA Assay (FDA approved for NASAL specimens in patients 51 years of age and older), is one component of a comprehensive surveillance program. It is not intended to diagnose infection nor to guide or monitor  treatment. Performed at [facility] Lab, [address] [place], [place] [address]   Culture, blood (Routine X 2) w Reflex to ID Panel     Status: None (Preliminary result)   Collection Time: 09/27/21  6:53 PM   Specimen: BLOOD  Result Value Ref Range Status   Specimen Description BLOOD BLOOD RIGHT HAND  Final   Special Requests IN PEDIATRIC BOTTLE Blood Culture adequate volume  Final   Culture   Final    NO GROWTH 4 DAYS Performed at [facility] Lab, [address], [place], [place] [address]    Report Status PENDING  Incomplete  Culture, blood (Routine X 2) w Reflex to ID Panel     Status: None (Preliminary result)   Collection Time: 09/27/21  6:53 PM   Specimen: BLOOD  Result Value Ref Range Status   Specimen Description BLOOD BLOOD RIGHT ARM  Final   Special Requests   Final    BOTTLES DRAWN AEROBIC AND ANAEROBIC Blood Culture adequate volume   Culture   Final    NO GROWTH 4 DAYS Performed at [facility], [address] [place], [place] [address]    Report Status PENDING  Incomplete  Aerobic/Anaerobic Culture w Gram Stain (surgical/deep wound)     Status: None (Preliminary result)   Collection Time: 09/29/21 11:06 AM   Specimen: Abscess  Result Value Ref Range Status   Specimen Description ABSCESS  Final   Special Requests HEPATIC ABSCESS  Final   Gram Stain   Final    ABUNDANT WBC PRESENT,BOTH PMN AND MONONUCLEAR NO ORGANISMS SEEN    Culture   Final    NO GROWTH 2 DAYS NO ANAEROBES ISOLATED; CULTURE IN PROGRESS FOR 5 DAYS Performed at [facility] Lab, [address] [place], [place] [address]    Report Status PENDING  Incomplete     No results found.   Family Communication: None at bedside  Disposition: Status is: Inpatient Remains inpatient appropriate because: Remains critically ill Planned Discharge Destination:  TBD  - SNF vs. LTC.  Tyrone Nine, MD 10/01/2021 3:59 PM Page by [email or web address]

## 2021-10-02 DIAGNOSIS — D735 Infarction of spleen: Secondary | ICD-10-CM | POA: Diagnosis not present

## 2021-10-02 DIAGNOSIS — J9611 Chronic respiratory failure with hypoxia: Secondary | ICD-10-CM | POA: Diagnosis not present

## 2021-10-02 DIAGNOSIS — I96 Gangrene, not elsewhere classified: Secondary | ICD-10-CM | POA: Diagnosis not present

## 2021-10-02 DIAGNOSIS — R778 Other specified abnormalities of plasma proteins: Secondary | ICD-10-CM | POA: Diagnosis not present

## 2021-10-02 LAB — CULTURE, BLOOD (ROUTINE X 2)
Culture: NO GROWTH
Culture: NO GROWTH
Special Requests: ADEQUATE
Special Requests: ADEQUATE

## 2021-10-02 LAB — HEPARIN LEVEL (UNFRACTIONATED): Heparin Unfractionated: 0.39 IU/mL (ref 0.30–0.70)

## 2021-10-02 LAB — CBC
HCT: 26.2 % — ABNORMAL LOW (ref 36.0–46.0)
Hemoglobin: 7.3 g/dL — ABNORMAL LOW (ref 12.0–15.0)
MCH: 27.7 pg (ref 26.0–34.0)
MCHC: 27.9 g/dL — ABNORMAL LOW (ref 30.0–36.0)
MCV: 99.2 fL (ref 80.0–100.0)
Platelets: 309 10*3/uL (ref 150–400)
RBC: 2.64 MIL/uL — ABNORMAL LOW (ref 3.87–5.11)
RDW: 20.2 % — ABNORMAL HIGH (ref 11.5–15.5)
WBC: 25.3 10*3/uL — ABNORMAL HIGH (ref 4.0–10.5)
nRBC: 0.9 % — ABNORMAL HIGH (ref 0.0–0.2)

## 2021-10-02 LAB — RENAL FUNCTION PANEL
Albumin: 1.9 g/dL — ABNORMAL LOW (ref 3.5–5.0)
Anion gap: 9 (ref 5–15)
BUN: 18 mg/dL (ref 6–20)
CO2: 28 mmol/L (ref 22–32)
Calcium: 7.8 mg/dL — ABNORMAL LOW (ref 8.9–10.3)
Chloride: 97 mmol/L — ABNORMAL LOW (ref 98–111)
Creatinine, Ser: 4.69 mg/dL — ABNORMAL HIGH (ref 0.44–1.00)
GFR, Estimated: 11 mL/min — ABNORMAL LOW (ref >60)
Glucose, Bld: 104 mg/dL — ABNORMAL HIGH (ref 70–99)
Phosphorus: 4 mg/dL (ref 2.5–4.6)
Potassium: 3.8 mmol/L (ref 3.5–5.1)
Sodium: 134 mmol/L — ABNORMAL LOW (ref 135–145)

## 2021-10-02 LAB — GLUCOSE, CAPILLARY
Glucose-Capillary: 98 mg/dL (ref 70–99)
Glucose-Capillary: 100 mg/dL — ABNORMAL HIGH (ref 70–99)
Glucose-Capillary: 93 mg/dL (ref 70–99)
Glucose-Capillary: 110 mg/dL — ABNORMAL HIGH (ref 70–99)

## 2021-10-02 LAB — APTT: aPTT: 65 seconds — ABNORMAL HIGH (ref 24–36)

## 2021-10-02 MED ORDER — METOCLOPRAMIDE HCL 5 MG/ML IJ SOLN
5.0000 mg | Freq: Four times a day (QID) | INTRAMUSCULAR | Status: DC
  Administered 2021-10-02 – 2021-10-20 (×60): 5 mg via INTRAVENOUS
  Filled 2021-10-02 (×62): qty 2

## 2021-10-02 MED ORDER — NEPRO/CARBSTEADY PO LIQD: 237.0000 mL | Freq: Two times a day (BID) | ORAL | Status: DC

## 2021-10-02 NOTE — Progress Notes (Addendum)
[place] for heparin Indication:  splenic infarct  Heparin Dosing Weight: 96.1 kg  Labs: Recent Labs    09/30/21 0500 09/30/21 1049 10/01/21 0706 10/01/21 0847 10/02/21 0621  HGB 7.1*  --  7.3*  --  7.3*  HCT 24.5*  --  26.2*  --  26.2*  PLT 274  --  323  --  309  APTT 85* 72*  --  69* 65*  HEPARINUNFRC 1.02*  --   --  0.57 0.39  CREATININE 5.51*  --  6.79*  --  4.69*     Assessment: 64 yof with hx PVD s/p R AKA 5/10 for critical limb ischemia, ESRD on HD TTS, found to have splenic infarct and transitioned from heparin infusion to apixaban on 6/14. Pharmacy consulted to transition back to heparin infusion with patient hypotensive requiring transfer to ICU s/p TEE on 6/23. Last dose of apixaban 6/23 at 1048. She is s/p IR drain placement for hepatic abscess and also noted CT finding of possible subcapsular hematoma  -heparin level at goal, Hg= 7.3 (low, stable) -Menorrhagia noted 2/7 and megace started    Goal of Therapy:  Heparin level= 0.3-0.5 Monitor platelets by anticoagulation protocol: Yes   Plan:  Continue IV heparin at 1950 units/hr  Daily heparin level and CBC D/c daily aPTT  Hildred Laser, PharmD Clinical Pharmacist **Pharmacist phone directory can now be found on [email or web address] ([email or web address]).  Listed under [place].

## 2021-10-02 NOTE — Plan of Care (Signed)
  Problem: Education: Goal: Ability to describe self-care measures that may prevent or decrease complications (Diabetes Survival Skills Education) will improve Outcome: Progressing Goal: Individualized Educational Video(s) Outcome: Progressing   

## 2021-10-02 NOTE — Progress Notes (Signed)
Patient is now approximately 9 days s/p amputation of the left middle finger through the PIP joint for dry gangrene of the finger.  Her incision is clean and well approximated.  There is no drainage.  There is appropriate postop swelling.  The wound was closed using vicryl rapide sutures so no need for suture removal.  Sherilyn Cooter, M.D. [place]

## 2021-10-02 NOTE — TOC Progression Note (Signed)
Transition of Care [facility]) - Progression Note    Patient Details  Name: Courtney Gray MRN: [id number] Date of Birth: [birth year]  [facility] CM/SW Contact  Reece Agar, [place] Phone Number: 10/02/2021, 4:06 PM  Clinical Narrative:    CSW attempted to contact Tressa Busman at [facility] in [place], he was not available and will contact CSW about pt bed offer tomorrow. CSW will continue following for DC needs.    Expected Discharge Plan: Skilled Nursing Facility Barriers to Discharge: Continued Medical Work up  Expected Discharge Plan and Services Expected Discharge Plan: [place] In-house Referral: Clinical Social Work Discharge Planning Services: CM Consult Post Acute Care Choice: Durable Medical Equipment Living arrangements for the past 2 months: Single Family Home                                       Social Determinants of Health (SDOH) Interventions    Readmission Risk Interventions    09/16/2021    4:36 PM 08/28/2021   11:50 AM 07/12/2021   12:38 PM  Readmission Risk Prevention Plan  Transportation Screening Complete Complete Complete  Medication Review [employer])  Complete Complete  PCP or Specialist appointment within 3-5 days of discharge  Complete Complete  HRI or Home Care Consult Complete Complete Complete  SW Recovery Care/Counseling Consult Complete Complete Complete  Palliative Care Screening  Not Applicable Not Applicable  Skilled Nursing Facility Complete Not Applicable Complete

## 2021-10-02 NOTE — Progress Notes (Signed)
[facility]                                                                              Courtney Gray, is a 51 y.o. female, DOB - 05/14/1970, MHD:[id number] Admit date - 09/09/2021    Outpatient Primary MD for the patient is Monico Blitz, MD  LOS - 22  days  Chief Complaint  Patient presents with   Nausea   Constipation       Brief summary   Courtney Gray is a 51 y.o. 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 She was hypoglycemic, febrile with CT revealing hypoenhancement in the spleen consistent with infarct. Started on IV heparin, empiric antibiotics for septic shock of unclear origin. Later taken for left middle finger amputation 6/19, Ultimately was taken for TEE 6/23 which revealed PFO without vegetation and required transfer to ICU for septic shock found to have hepatic and splenic abscesses. ID has guided IV antibiotics, IR placed hepatic abscess drain 6/25.    Assessment & Plan   Septic shock Bacteremia bacteroids thetaiotaomicron-beta-lactamase positive on 6/17: -ID following, recommended continue cefepime with dialysis and oral metronidazole -Drain management per IR -Still has persistent leukocytosis, although trending down, no fevers  Hepatic and splenic abscess -Status post CT-guided drainage of liver abscess by IR on 6/25 with purulent fluid -IR following, continue 3 times daily flushes -Will need repeat imaging/possible drain injection once output less than 10 mL/daily and consideration of drain removal if output is less than 10 mL/daily -No role for acute surgical intervention at this time per surgery  Splenic infarct -No embolic source noted on echo or CT. -TEE on 6/23 showed normal LV function, negative for endocarditis.   Calcified mass in the right atrium-etiology unclear, possibly calcified thrombus. -Continue heparin IV    Diabetes mellitus type 2, insulin-dependent, uncontrolled with hypoglycemia -Hemoglobin A1c 5.7%    Hypotension, likely sedation related -BP now stable and trending up.  Off vasopressors  Left third digit ischemic gangrene Severe PAD with prior AKA -Seen by orthopedics and vascular surgery, amputation of left third digit on 6/19 -Continue pain control, aspirin, statin   Left second toe cyanosis -Cannot rule out early necrosis, poor vasculature -Orthopedic surgery evaluated  Acute on chronic respiratory failure with hypoxia Suspected OHS, OSA -Continue O2, 2 to 3 L via [place], at baseline    ESRD on hemodialysis, TTS -Per nephrology   Anemia, multifactorial -Status post 2 unit packed RBCs 5/23, 6/22 with HD -Continue Aranesp.  Hemoglobin stable at 7.3  Menorrhagia -Declined pelvic ultrasound, continue Megace -Transfuse if hemoglobin less than 7  Demand ischemia with elevated troponin in the setting of anemia, sepsis -ACS ruled out.  Currently no chest pain.  Essential hypertension -Initially was hypotensive, requiring pressors, midodrine -BP stable.  Amlodipine and Coreg on hold    Hyperlipidemia  -Continue statin   GERD  -continue PPI  Acute on chronic ambulatory dysfunction -Continue  PT OT, recommended SNF    Pressure ulcer stage II left and right buttock POA -Continue local wound care.  Change position every 2 hours  Morbid obesity Estimated body mass index is 56.57 kg/m as calculated from the following:   Height as of this encounter: 5\' 5"  (1.651 m).   Weight as of this encounter: 154.2 kg.  Code Status: Full code DVT Prophylaxis:    Level of Care: Level of care: Progressive Family Communication: Updated patient   Disposition Plan:      Remains inpatient appropriate: On IV antibiotics   Procedures:  HD  Consultants:    Nephrology Infectious disease Interventional radiology General surgery  Antimicrobials:   Anti-infectives (From admission, onward)    Start     Dose/Rate Route Frequency Ordered Stop   09/25/21 0915  metroNIDAZOLE (FLAGYL) IVPB 500 mg        500 mg 100 mL/hr over 60 Minutes Intravenous Every 12 hours 09/25/21 0822     09/13/21 1000  vancomycin (VANCOCIN) IVPB 1000 mg/200 mL premix        1,000 mg 200 mL/hr over 60 Minutes Intravenous STAT 09/13/21 0904 09/13/21 1140   09/10/21 1200  vancomycin (VANCOCIN) IVPB 1000 mg/200 mL premix  Status:  Discontinued        1,000 mg 200 mL/hr over 60 Minutes Intravenous Every T-Th-Sa (Hemodialysis) 09/10/21 0620 09/17/21 1013   09/10/21 1200  ceFEPIme (MAXIPIME) 2 g in sodium chloride 0.9 % 100 mL IVPB        2 g 200 mL/hr over 30 Minutes Intravenous Every T-Th-Sa (Hemodialysis) 09/10/21 1107     09/10/21 0445  vancomycin (VANCOREADY) IVPB 2000 mg/400 mL        2,000 mg 200 mL/hr over 120 Minutes Intravenous  Once 09/10/21 0435 09/10/21 0800   09/10/21 0430  ceFEPIme (MAXIPIME) 2 g in sodium chloride 0.9 % 100 mL IVPB        2 g 200 mL/hr over 30 Minutes Intravenous  Once 09/10/21 0420 09/10/21 0550          Medications  aspirin EC  81 mg Oral Daily   atorvastatin  10 mg Oral QHS   calcitRIOL  1 mcg Oral Q T,Th,Sa-HD   calcium carbonate  1,000 mg Oral TID with meals   cinacalcet  30 mg Oral Q T,Th,Sa-HD   darbepoetin (ARANESP) injection - DIALYSIS  200 mcg Intravenous Q Thu-HD   diclofenac Sodium  2 g Topical QID   feeding supplement  1 Container Oral TID BM   gabapentin  100 mg Oral Q12H   gabapentin  200 mg Oral Q T,Th,Sa-HD   Gerhardt's butt cream   Topical BID   insulin aspart  1-3 Units Subcutaneous TID WC   lactulose  30 g Oral TID   megestrol  40 mg Oral BID   metoCLOPramide (REGLAN) injection  10 mg Intravenous Q6H   multivitamin  1 tablet Oral QHS   pantoprazole  40 mg Oral BID   polyethylene glycol  17 g Oral BID    senna-docusate  1 tablet Oral BID   sodium chloride flush  10-40 mL Intracatheter Q12H   sodium chloride flush  3 mL Intravenous Q12H   sodium chloride flush  5 mL Intracatheter Q8H      Subjective:   Courtney Gray was seen and examined today.  Feeling tired and weak otherwise no acute complaints.      Objective:   Vitals:   10/01/21 1255 10/01/21 2127 10/02/21 0354  10/02/21 0800  BP: (!) 142/78 (!) 112/42 (!) 150/71 136/86  Pulse: 88 84 82 89  Resp: 14 17 17 18   Temp:  98.6 F (37 C) 98.5 F (36.9 C) 99.2 F (37.3 C)  TempSrc:  Oral Oral Oral  SpO2: 95% 98% 100% 99%  Weight:      Height:        Intake/Output Summary (Last 24 hours) at 10/02/2021 1049 Last data filed at 10/01/2021 2253 Gross per 24 hour  Intake 1101.14 ml  Output 25 ml  Net 1076.14 ml     Wt Readings from Last 3 Encounters:  10/01/21 (!) 154.2 kg  09/03/21 (!) 150.6 kg  08/28/21 (!) 150.1 kg     Exam General: Alert and oriented x 3, NAD Cardiovascular: S1 S2 auscultated,  RRR Respiratory: Diminished breath sound at the bases Gastrointestinal: Soft, nontender, nondistended, + bowel sounds, mid abdominal drain Ext: no pedal edema bilaterally Neuro: Strength 5/5 upper and lower extremities bilaterally Psych: Normal affect and demeanor, alert and oriented x3     Data Reviewed:  I have personally reviewed following labs    CBC Lab Results  Component Value Date   WBC 25.3 (H) 10/02/2021   RBC 2.64 (L) 10/02/2021   HGB 7.3 (L) 10/02/2021   HCT 26.2 (L) 10/02/2021   MCV 99.2 10/02/2021   MCH 27.7 10/02/2021   PLT 309 10/02/2021   MCHC 27.9 (L) 10/02/2021   RDW 20.2 (H) 10/02/2021   LYMPHSABS 1.0 09/28/2021   MONOABS 1.3 (H) 09/28/2021   EOSABS 0.0 09/28/2021   BASOSABS 0.0 61/60/7371     Last metabolic panel Lab Results  Component Value Date   NA 134 (L) 10/02/2021   K 3.8 10/02/2021   CL 97 (L) 10/02/2021   CO2 28 10/02/2021   BUN 18 10/02/2021   CREATININE 4.69 (H)  10/02/2021   GLUCOSE 104 (H) 10/02/2021   GFRNONAA 11 (L) 10/02/2021   GFRAA 6 (L) 11/29/2019   CALCIUM 7.8 (L) 10/02/2021   PHOS 4.0 10/02/2021   PROT 6.0 (L) 09/14/2021   ALBUMIN 1.9 (L) 10/02/2021   BILITOT 0.7 09/14/2021   ALKPHOS 124 09/14/2021   AST 16 09/14/2021   ALT 10 09/14/2021   ANIONGAP 9 10/02/2021    CBG (last 3)  Recent Labs    09/30/21 2035 10/01/21 1702 10/01/21 2134  GLUCAP 176* 137* 169*          Kasara Schomer M.D. [facility] 10/02/2021, 10:49 AM  Available via Epic secure chat 7am-7pm After 7 pm, please refer to night coverage provider listed on amion.

## 2021-10-02 NOTE — Progress Notes (Signed)
Nutrition Follow-up  DOCUMENTATION CODES:  Not applicable  INTERVENTION:  - Continue Boost Breeze po TID, each supplement provides 250 kcal and 9 grams of protein - Resume Regular diet with 1200 ml fluid restriction to promote PO intake - Renal MVI daily - Consider alternate means of nutrition if oral intake does not improve - Trial Nepro Shake po BID per Nephrology, each supplement provides 425 kcal and 19 grams protein   NUTRITION DIAGNOSIS:  Inadequate oral intake related to nausea as evidenced by per patient/family report. Ongoing  GOAL:  Patient will meet greater than or equal to 90% of their needs Unmet  MONITOR:  PO intake, Supplement acceptance, Labs, Weight trends, Skin, I & O's  REASON FOR ASSESSMENT:  Consult Enteral/tube feeding initiation and management (advance tube feeds to goal)  ASSESSMENT:  51 year old female who presented to the ED on 6/06 with abdominal pain, N/V, constipation. PMH of ESRD on HD, gangrene of RLE s/p recent R AKA, CHF, CAD, T2DM, chronic hypoxic respiratory failure on home oxygen, HLD, HTN, gout, severe PAD. Pt admitted with splenic infarction, PNA of LLL, shock.  06/06 - NG tube placed 06/08 - NG tube removed during CT scan, replaced by nursing 06/09 - NG tube removed, Cortrak placement attempted but unsuccessful due to pt refusal 6/19 - left middle finger amputation 6/23 - TEE 6/25 - CT guided drainage of hepatic abscess   Pt resting in bed at the time of assessment, meal tray at bedside untouched. Talked with pt about intake since last assessment. Pt reports that her appetite had been improving but now she doesn't want to eat as much since she has her drain placed. States the sight of the discharge takes her appetite away.   Discussed again the importance of nutrition with both her chronic illness and acute recovery. Expressed concern over not getting adequate energy or protein. Pt reports that she is drinking the boost breeze now.  Encouraged her to continue this and that if she anticipated intake will be poor at a meal to eat her meat and carbohydrates first as these will be more energy dense. Nephrology team added Nepro, pt states they are thick, but she will attempt to drink them.  Will continue to monitor.  EDW: 151 kg Admit weight: 150.6 kg Current weight: 154.2 kg Net IO Since Admission: 20,161.19 mL [10/02/21 1051]  Average Meal Intake: 6/13: 25% intake x 1 recorded meals 6/14-6/21: 88% intake x 2 recorded meals 6/22-6/28: 69% intake x 4 recorded meals  Nutritionally Relevant Medications: Scheduled Meds:  atorvastatin  10 mg Oral QHS   calcitRIOL  1 mcg Oral Q T,Th,Sa-HD   calcium carbonate  1,000 mg Oral TID with meals   cinacalcet  30 mg Oral Q T,Th,Sa-HD   feeding supplement  1 Container Oral TID BM   insulin aspart  1-3 Units Subcutaneous TID WC   lactulose  30 g Oral TID   megestrol  40 mg Oral BID   metoCLOPramide   10 mg Intravenous Q6H   multivitamin  1 tablet Oral QHS   pantoprazole  40 mg Oral BID   polyethylene glycol  17 g Oral BID   senna-docusate  1 tablet Oral BID   Continuous Infusions:  metronidazole 500 mg (10/01/21 2155)   PRN Meds: calcium carbonate, ondansetron, prochlorperazine  Labs Reviewed: Na 134, chloride 97 BUN 18, creatinine 4.69 CBG ranges from 98-169 mg/dL over the last 24 hours HgbA1c 5.7% (07/09/21)  NUTRITION - FOCUSED PHYSICAL EXAM: Flowsheet Row Most Recent  Value  Orbital Region No depletion  Upper Arm Region No depletion  Thoracic and Lumbar Region No depletion  Buccal Region No depletion  Temple Region No depletion  Clavicle Bone Region No depletion  Clavicle and Acromion Bone Region No depletion  Scapular Bone Region No depletion  Dorsal Hand No depletion  Patellar Region No depletion  Anterior Thigh Region No depletion  Posterior Calf Region No depletion  Edema (RD Assessment) Moderate  [BUE, BLE]  Hair Reviewed  Eyes Reviewed  Mouth Reviewed   Skin Reviewed  Nails Reviewed   Diet Order:   Diet Order             Diet heart healthy/carb modified Room service appropriate? Yes; Fluid consistency: Thin; Fluid restriction: 1200 mL Fluid  Diet effective now                   EDUCATION NEEDS:  Education needs have been addressed  Skin:  Skin Assessment: Skin Integrity Issues: Stage II: bilateral buttocks Incisions: RLE s/p AKA Other: left middle finger amputation  Last BM:  6/25  Height:  Ht Readings from Last 1 Encounters:  09/27/21 5\' 5"  (1.651 m)    Weight:  Wt Readings from Last 1 Encounters:  10/01/21 (!) 154.2 kg    Ideal Body Weight:  52.3 kg (adjusted for R AKA)  BMI:  Body mass index is 56.57 kg/m.  Estimated Nutritional Needs:  Kcal:  1800-2000 Protein:  90-110 grams Fluid:  1000 ml + UOP   Ranell Patrick, RD, LDN Clinical Dietitian RD pager # available in AMION  After hours/weekend pager # available in [facility]

## 2021-10-02 NOTE — Care Management Important Message (Signed)
Important Message  Patient Details  Name: Courtney Gray MRN: [id number] Date of Birth: 11-Sep-1970   Medicare Important Message Given:  Yes     Shelda Altes 10/02/2021, 8:07 AM

## 2021-10-02 NOTE — Progress Notes (Addendum)
[facility] Progress Note   Subjective: Awake, very interactive today with many appropriate questions. Primarily wants to know why she needs progesterone. Excited about weight loss and lower EDW. HD tomorrow on schedule.  RN getting weight.   Objective Vitals:   10/01/21 1255 10/01/21 2127 10/02/21 0354 10/02/21 0800  BP: (!) 142/78 (!) 112/42 (!) 150/71 136/86  Pulse: 88 84 82 89  Resp: 14 17 17 18   Temp:  98.6 F (37 C) 98.5 F (36.9 C) 99.2 F (37.3 C)  TempSrc:  Oral Oral Oral  SpO2: 95% 98% 100% 99%  Weight:      Height:       Physical Exam General: Chronically ill appearing morbidly obese female in NAD Heart:S1,S2 slightly distant (body habitus). No M/R/G  Lungs: CTAB slightly decreased in bases Abdomen: obese, soft, NABS. Mid line drain to bulb suction. Drsg intact.  Extremities:R AKA difficult to assess for stump edema as pannus in on top of stump but looks OK. LLE with trace-1+ edema. Ace Wrap L hand. Necrotic 2nd L toe.  Dialysis Access: RIJ TDC drsg intact IV Access: LSC Triple lumen SVL   Additional Objective Labs: Basic Metabolic Panel: Recent Labs  Lab 09/30/21 0500 10/01/21 0706 10/02/21 0621  NA 131* 132* 134*  K 3.4* 3.6 3.8  CL 94* 97* 97*  CO2 27 25 28   GLUCOSE 106* 105* 104*  BUN 27* 34* 18  CREATININE 5.51* 6.79* 4.69*  CALCIUM 7.5* 7.9* 7.8*  PHOS 4.1 5.3* 4.0   Liver Function Tests: Recent Labs  Lab 09/30/21 0500 10/01/21 0706 10/02/21 0621  ALBUMIN 1.8* 1.8* 1.9*   No results for input(s): "LIPASE", "AMYLASE" in the last 168 hours. CBC: Recent Labs  Lab 09/28/21 0933 09/29/21 0504 09/30/21 0500 10/01/21 0706 10/02/21 0621  WBC 33.0* 29.4* 25.0* 26.9* 25.3*  NEUTROABS 30.0*  --   --   --   --   HGB 7.5* 7.2* 7.1* 7.3* 7.3*  HCT 25.2* 24.4* 24.5* 26.2* 26.2*  MCV 95.1 96.1 96.1 98.1 99.2  PLT 276 277 274 323 309   Blood Culture    Component Value Date/Time   SDES ABSCESS 09/29/2021 1106   SPECREQUEST  HEPATIC ABSCESS 09/29/2021 1106   CULT  09/29/2021 1106    NO GROWTH 2 DAYS NO ANAEROBES ISOLATED; CULTURE IN PROGRESS FOR 5 DAYS Performed at [facility] [address], [place], [place] [address]    REPTSTATUS PENDING 09/29/2021 1106    Cardiac Enzymes: No results for input(s): "CKTOTAL", "CKMB", "CKMBINDEX", "TROPONINI" in the last 168 hours. CBG: Recent Labs  Lab 09/30/21 1138 09/30/21 1553 09/30/21 2035 10/01/21 1702 10/01/21 2134  GLUCAP 109* 179* 176* 137* 169*   Iron Studies: No results for input(s): "IRON", "TIBC", "TRANSFERRIN", "FERRITIN" in the last 72 hours. @lablastinr3 @ Studies/Results: No results found. Medications:  sodium chloride 10 mL/hr at 10/01/21 1800   ceFEPime (MAXIPIME) IV Stopped (10/01/21 1719)   heparin 1,950 Units/hr (10/01/21 2217)   metronidazole 500 mg (10/01/21 2155)    aspirin EC  81 mg Oral Daily   atorvastatin  10 mg Oral QHS   calcitRIOL  1 mcg Oral Q T,Th,Sa-HD   calcium carbonate  1,000 mg Oral TID with meals   cinacalcet  30 mg Oral Q T,Th,Sa-HD   darbepoetin (ARANESP) injection - DIALYSIS  200 mcg Intravenous Q Thu-HD   diclofenac Sodium  2 g Topical QID   feeding supplement  1 Container Oral TID BM   gabapentin  100  mg Oral Q12H   gabapentin  200 mg Oral Q T,Th,Sa-HD   Gerhardt's butt cream   Topical BID   insulin aspart  1-3 Units Subcutaneous TID WC   lactulose  30 g Oral TID   megestrol  40 mg Oral BID   metoCLOPramide (REGLAN) injection  10 mg Intravenous Q6H   multivitamin  1 tablet Oral QHS   pantoprazole  40 mg Oral BID   polyethylene glycol  17 g Oral BID   senna-docusate  1 tablet Oral BID   sodium chloride flush  10-40 mL Intracatheter Q12H   sodium chloride flush  3 mL Intravenous Q12H   sodium chloride flush  5 mL Intracatheter Q8H     Dialysis Orders: [name] TTS 4h 43min, BFR 400, EDW 151 kg, 1K/2.5Ca bath, TDC - 4/29 > HBsAg neg w/ Abs < 10 Hep 2,600 load and 1,500 hourly-Total dose 9275  units per treatment.  - mircera 200ug q2 due 09/10/21 - calcitriol 1 ug tiw - sensipar 30 mg tiw   Assessment/Plan: Septic shock: On admit requiring short-term pressors and again after TEE.  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  09/27/21 blood cultures NGTD  PAD s/p R AKA, L 2nd toe, L 3rd finger gangrene: Ortho hand and VVS involved. 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 Splenic Infarct/abdominal pain: anticoagulation per primary team  Splenic/Hepatic abscesses: S/P drain placed in IR 09/29/2021. Per primary ESRD: Continue HD per usual TTS schedule. Next HD 10/03/2021. On heparin gtt-will hold heparin.  Chronic hypoxic resp failure - note that she is on 3 liters at home HTN/volume:  Optimize UF with HD. She has now reached her old EDW and may need to lower further. Added daily wts. Volume status is still questionable D/T bed weights.  Anemia of ESRD: HGB low. Continue Aranesp 200 mcg q Thursday. Transfused 1 unit of PRBCs with HD 09/26/2021.    She has acute illness and compounding this is on her menstrual cycle  Secondary Hyperparathyroidism: CorrCa/Phos ok, on Tums as binder. Continue VDRA + sensipar.   T2DM Protein Calorie malnutrition in setting of Morbid Obesity - Albumin very low. Add protein supplements.     Reya Aurich H. Aakash Hollomon NP-C 10/02/2021, 11:00 AM  [employer] [phone]

## 2021-10-02 NOTE — Progress Notes (Signed)
Supervising Physician: Arne Cleveland  Patient Status:  [facility] - In-pt  Chief Complaint:   Hepatic abscess drain placement 09/29/21 with Dr. Kathlene Cote  Subjective:  Pt resting in bed talking on phone.  She denies pain/tenderness at insertion site. Pt states she has no appetite. Denies N/V.  Allergies: Benzonatate, Hydralazine, Amoxicillin, Clonidine, Chlorhexidine, Morphine, and Oxycodone  Medications: Prior to Admission medications   Medication Sig Start Date End Date Taking? Authorizing Provider  acetaminophen (TYLENOL) 500 MG tablet Take 1,000 mg by mouth as needed for moderate pain.   Yes [provider]  amLODipine (NORVASC) 10 MG tablet Take 1 tablet (10 mg total) by mouth daily. Patient taking differently: Take 10 mg by mouth daily as needed (Blood pressusre). 11/29/20  Yes Roxan Hockey, MD  aspirin EC 81 MG tablet Take 1 tablet (81 mg total) by mouth daily with breakfast. 11/28/20  Yes Emokpae, Courage, MD  atorvastatin (LIPITOR) 10 MG tablet Take 1 tablet (10 mg total) by mouth daily. Patient taking differently: Take 10 mg by mouth at bedtime. 11/28/20  Yes Emokpae, Courage, MD  calcium carbonate (TUMS SMOOTHIES) 750 MG chewable tablet Chew 1,500 mg by mouth See admin instructions. Take 1500 mg with each meal and 1500 mg with each snack   Yes [provider]  carvedilol (COREG) 6.25 MG tablet Take 1 tablet (6.25 mg total) by mouth 2 (two) times daily with a meal. Patient taking differently: Take 6.25 mg by mouth 2 (two) times daily as needed (if BP > 150). 08/28/21 09/27/21 Yes Pahwani, Einar Grad, MD  diphenhydrAMINE (BENADRYL) 25 MG tablet Take 25 mg by mouth 2 (two) times daily as needed for allergies.   Yes [provider]  docusate sodium (COLACE) 100 MG capsule Take 100 mg by mouth 2 (two) times daily.   Yes [provider]  gabapentin (NEURONTIN) 100 MG capsule Take 1 capsule (100 mg total) by mouth 2 (two) times daily. Patient taking  differently: Take 100 mg by mouth See admin instructions. Take 100 mg by mouth twice a day on non dialysis days. On dialysis day take 100 mg in the morning, 200 mg after dialysis, and then 100 mg at bedtime. 08/28/21 09/27/21 Yes Pahwani, Einar Grad, MD  glipiZIDE (GLUCOTROL XL) 2.5 MG 24 hr tablet Take 1 tablet (2.5 mg total) by mouth daily with breakfast. 08/28/21 09/27/21 Yes Pahwani, Einar Grad, MD  HYDROcodone-acetaminophen (NORCO/VICODIN) 5-325 MG tablet Take 1-2 tablets by mouth every 4 (four) hours as needed for moderate pain. Patient taking differently: Take 1 tablet by mouth every 4 (four) hours as needed for moderate pain. 08/19/21  Yes Pahwani, Einar Grad, MD  insulin glargine (LANTUS) 100 UNIT/ML Solostar Pen Inject 12 Units into the skin daily.   Yes [provider]  loperamide (IMODIUM A-D) 2 MG tablet Take 4 mg by mouth as needed for diarrhea or loose stools.   Yes [provider]  nitroGLYCERIN (NITROSTAT) 0.4 MG SL tablet Place 1 tablet (0.4 mg total) under the tongue every 5 (five) minutes as needed for chest pain. 05/24/21  Yes [place] Eric J, DO  pantoprazole (PROTONIX) 40 MG tablet Take 1 tablet (40 mg total) by mouth daily. 11/29/20  Yes Roxan Hockey, MD  simethicone (MYLICON) 785 MG chewable tablet Chew 125 mg by mouth in the morning and at bedtime.   Yes [provider]  tiZANidine (ZANAFLEX) 4 MG capsule Take 4 mg by mouth in the morning and at bedtime.   Yes [provider]  doxycycline (VIBRAMYCIN)  100 MG capsule Take 100 mg by mouth 2 (two) times daily. Patient not taking: Reported on 09/10/2021 08/30/21   [provider]  lisinopril (ZESTRIL) 20 MG tablet Take 1 tablet (20 mg total) by mouth daily. Patient not taking: Reported on 09/10/2021 08/28/21 09/27/21  Darliss Cheney, MD  traMADol (ULTRAM) 50 MG tablet Take 1 tablet (50 mg total) by mouth every 6 (six) hours as needed for moderate pain. Patient not taking: Reported on 09/10/2021 08/28/21   Darliss Cheney, MD     Vital Signs: BP 136/86 (BP Location: Right Wrist)   Pulse 89   Temp 99.2 F (37.3 C) (Oral)   Resp 18   Ht 5\' 5"  (1.651 m)   Wt (!) 330 lb 0.5 oz (149.7 kg)   SpO2 99%   BMI 54.92 kg/m   Physical Exam Vitals reviewed.  Constitutional:      General: She is not in acute distress.    Appearance: She is ill-appearing.  HENT:     Head: Normocephalic and atraumatic.  Eyes:     Extraocular Movements: Extraocular movements intact.     Pupils: Pupils are equal, round, and reactive to light.  Pulmonary:     Effort: Pulmonary effort is normal. No respiratory distress.  Abdominal:     Comments: Drain flushes/aspirates easily. Site unremarkable with sutures/statlock in place. ~ 25 cc cloudy,pink OP in [place]. Dressing C/D/I.    Skin:    General: Skin is warm and dry.  Neurological:     Mental Status: She is alert and oriented to person, place, and time.  Psychiatric:        Mood and Affect: Mood normal.        Behavior: Behavior normal.        Thought Content: Thought content normal.        Judgment: Judgment normal.     Imaging: CT IMAGE GUIDED DRAINAGE BY PERCUTANEOUS CATHETER  Result Date: 09/29/2021 CLINICAL DATA:  Hepatic abscess. EXAM: CT GUIDED CATHETER DRAINAGE OF HEPATIC ABSCESS ANESTHESIA/SEDATION: Moderate (conscious) sedation was employed during this procedure. A total of Versed 1.5 mg and Fentanyl 100 mcg was administered intravenously by radiology nurse. Moderate Sedation Time: 10 minutes. The patient's level of consciousness and vital signs were monitored continuously by radiology nursing throughout the procedure under my direct supervision. PROCEDURE: The procedure, risks, benefits, and alternatives were explained to the patient. Questions regarding the procedure were encouraged and answered. The patient understands and consents to the procedure. A time out was performed prior to initiating the procedure. CT was performed through the abdomen in a supine  position. The anterior abdominal wall was prepped with chlorhexidine in a sterile fashion, and a sterile drape was applied covering the operative field. A sterile gown and sterile gloves were used for the procedure. Local anesthesia was provided with 1% Lidocaine. Under CT guidance, an 18 gauge trocar needle was advanced into an abscess in the left lobe of the liver. After confirming needle tip position and aspiration of a small amount of fluid, a guidewire was advanced into the collection, the tract was dilated over the wire and a 10 French percutaneous pigtail drainage catheter advanced and formed. Drain position was confirmed by CT. A larger fluid sample was then withdrawn from the drain and sent for culture analysis. The drain was flushed with sterile saline and connected to a suction bulb. The drainage catheter was secured at the skin with a Prolene retention suture and overlying sterile dressing. RADIATION DOSE REDUCTION: This  exam was performed according to the departmental dose-optimization program which includes automated exposure control, adjustment of the mA and/or kV according to patient size and/or use of iterative reconstruction technique. COMPLICATIONS: None FINDINGS: CT again demonstrates an ill-defined ovoid collection within the left lobe of the liver measuring approximately 6 cm in maximum diameter. After needle puncture, there was return of grossly purulent fluid. After placement of the drain, there is abundant return of purulent fluid. The communicating splenic collections appear similar by CT and may also represent abscesses. IMPRESSION: 1. CT-guided percutaneous catheter drainage of left lobe hepatic abscess yielding purulent fluid. A fluid sample was sent for culture analysis. A 10 French drainage catheter was placed and attached to suction bulb drainage. 2. Rounded communicating splenic collections are similar in appearance to the hepatic collection and are suspicious for splenic abscesses.  Electronically Signed   By: Aletta Edouard M.D.   On: 09/29/2021 13:17    Labs:  CBC: Recent Labs    09/29/21 0504 09/30/21 0500 10/01/21 0706 10/02/21 0621  WBC 29.4* 25.0* 26.9* 25.3*  HGB 7.2* 7.1* 7.3* 7.3*  HCT 24.4* 24.5* 26.2* 26.2*  PLT 277 274 323 309    COAGS: Recent Labs    07/10/21 0627 09/10/21 0403 09/28/21 0151 09/30/21 0500 09/30/21 1049 10/01/21 0847 10/02/21 0621  INR 1.3* 1.3*  --   --   --   --   --   APTT  --   --    < > 85* 72* 69* 65*   < > = values in this interval not displayed.    BMP: Recent Labs    09/29/21 0504 09/30/21 0500 10/01/21 0706 10/02/21 0621  NA 133* 131* 132* 134*  K 3.4* 3.4* 3.6 3.8  CL 96* 94* 97* 97*  CO2 27 27 25 28   GLUCOSE 130* 106* 105* 104*  BUN 18 27* 34* 18  CALCIUM 7.7* 7.5* 7.9* 7.8*  CREATININE 3.97* 5.51* 6.79* 4.69*  GFRNONAA 13* 9* 7* 11*    LIVER FUNCTION TESTS: Recent Labs    09/03/21 2040 09/10/21 0031 09/10/21 0920 09/11/21 0324 09/14/21 0636 09/19/21 0915 09/29/21 0504 09/30/21 0500 10/01/21 0706 10/02/21 0621  BILITOT 0.4 0.7  --  0.8 0.7  --   --   --   --   --   AST 23 18  --  30 16  --   --   --   --   --   ALT 16 11  --  11 10  --   --   --   --   --   ALKPHOS 77 79  --  76 124  --   --   --   --   --   PROT 7.7 7.8  --  7.1 6.0*  --   --   --   --   --   ALBUMIN 3.0* 2.8*   < > 2.4* 1.9*  1.9*   < > 1.8* 1.8* 1.8* 1.9*   < > = values in this interval not displayed.    Assessment and Plan:  Drain flushes/aspirates easily. Site unremarkable with sutures/statlock in place. ~ 25 cc cloudy,pink OP in [place]. Dressing C/D/I.  ~ 25 cc cloudy, pink OP in [place]. 25 cc documented OP.   WBC 25.3 (26.9) Tmax 99.2  Continue documenting OP in Epic q shift.  Continue flushing TID.  Change dressing q shift or as needed.   Please call IR if difficulty flushing or sudden change in  output.     Electronically Signed: Tyson Alias, NP 10/02/2021, 3:46 PM   I spent a total of 15  Minutes at the the patient's bedside AND on the patient's hospital floor or unit, greater than 50% of which was counseling/coordinating care for hepatic abscess drain.

## 2021-10-03 DIAGNOSIS — D735 Infarction of spleen: Secondary | ICD-10-CM | POA: Diagnosis not present

## 2021-10-03 DIAGNOSIS — R778 Other specified abnormalities of plasma proteins: Secondary | ICD-10-CM | POA: Diagnosis not present

## 2021-10-03 DIAGNOSIS — I96 Gangrene, not elsewhere classified: Secondary | ICD-10-CM | POA: Diagnosis not present

## 2021-10-03 DIAGNOSIS — J9611 Chronic respiratory failure with hypoxia: Secondary | ICD-10-CM | POA: Diagnosis not present

## 2021-10-03 DIAGNOSIS — K750 Abscess of liver: Secondary | ICD-10-CM | POA: Diagnosis not present

## 2021-10-03 LAB — GLUCOSE, CAPILLARY
Glucose-Capillary: 106 mg/dL — ABNORMAL HIGH (ref 70–99)
Glucose-Capillary: 117 mg/dL — ABNORMAL HIGH (ref 70–99)
Glucose-Capillary: 154 mg/dL — ABNORMAL HIGH (ref 70–99)

## 2021-10-03 LAB — RENAL FUNCTION PANEL
Albumin: 1.9 g/dL — ABNORMAL LOW (ref 3.5–5.0)
Anion gap: 10 (ref 5–15)
BUN: 27 mg/dL — ABNORMAL HIGH (ref 6–20)
CO2: 25 mmol/L (ref 22–32)
Calcium: 7.8 mg/dL — ABNORMAL LOW (ref 8.9–10.3)
Chloride: 95 mmol/L — ABNORMAL LOW (ref 98–111)
Creatinine, Ser: 6.24 mg/dL — ABNORMAL HIGH (ref 0.44–1.00)
GFR, Estimated: 8 mL/min — ABNORMAL LOW (ref >60)
Glucose, Bld: 113 mg/dL — ABNORMAL HIGH (ref 70–99)
Phosphorus: 5.2 mg/dL — ABNORMAL HIGH (ref 2.5–4.6)
Potassium: 4 mmol/L (ref 3.5–5.1)
Sodium: 130 mmol/L — ABNORMAL LOW (ref 135–145)

## 2021-10-03 LAB — HEPARIN LEVEL (UNFRACTIONATED)
Heparin Unfractionated: 0.2 IU/mL — ABNORMAL LOW (ref 0.30–0.70)
Heparin Unfractionated: 0.35 IU/mL (ref 0.30–0.70)

## 2021-10-03 LAB — CBC
HCT: 25.9 % — ABNORMAL LOW (ref 36.0–46.0)
Hemoglobin: 7.5 g/dL — ABNORMAL LOW (ref 12.0–15.0)
MCH: 28.3 pg (ref 26.0–34.0)
MCHC: 29 g/dL — ABNORMAL LOW (ref 30.0–36.0)
MCV: 97.7 fL (ref 80.0–100.0)
Platelets: 324 10*3/uL (ref 150–400)
RBC: 2.65 MIL/uL — ABNORMAL LOW (ref 3.87–5.11)
RDW: 20.2 % — ABNORMAL HIGH (ref 11.5–15.5)
WBC: 24.1 10*3/uL — ABNORMAL HIGH (ref 4.0–10.5)
nRBC: 0.4 % — ABNORMAL HIGH (ref 0.0–0.2)

## 2021-10-03 MED ORDER — SENNOSIDES-DOCUSATE SODIUM 8.6-50 MG PO TABS: 1.0000 | ORAL_TABLET | Freq: Every evening | ORAL | Status: DC | PRN

## 2021-10-03 MED ORDER — HEPARIN SODIUM (PORCINE) 1000 UNIT/ML IJ SOLN
INTRAMUSCULAR | Status: AC
  Filled 2021-10-03: qty 4

## 2021-10-03 MED ORDER — POLYETHYLENE GLYCOL 3350 17 G PO PACK
17.0000 g | PACK | Freq: Every day | ORAL | Status: DC
  Administered 2021-10-03 – 2021-10-12 (×6): 17 g via ORAL
  Filled 2021-10-03 (×6): qty 1

## 2021-10-03 MED ORDER — HEPARIN SODIUM (PORCINE) 1000 UNIT/ML DIALYSIS: 9000.0000 [IU] | Freq: Once | INTRAMUSCULAR | Status: DC

## 2021-10-03 MED ORDER — AMLODIPINE BESYLATE 10 MG PO TABS
10.0000 mg | ORAL_TABLET | Freq: Every day | ORAL | Status: DC
  Administered 2021-10-04 – 2021-10-08 (×5): 10 mg via ORAL
  Filled 2021-10-03 (×6): qty 1

## 2021-10-03 MED ORDER — CALCIUM ACETATE (PHOS BINDER) 667 MG PO CAPS
667.0000 mg | ORAL_CAPSULE | Freq: Three times a day (TID) | ORAL | Status: DC
  Administered 2021-10-03 – 2021-10-05 (×5): 667 mg via ORAL
  Filled 2021-10-03 (×6): qty 1

## 2021-10-03 MED ORDER — LACTULOSE 10 GM/15ML PO SOLN: 30.0000 g | Freq: Two times a day (BID) | ORAL | Status: DC | PRN

## 2021-10-03 NOTE — Progress Notes (Addendum)
[facility] Progress Note   Subjective: Seen in room. No new issues reported overnight. HD today on schedule.  Arguing with nurse about calcium carb which is being used as binder. Change to calcium acetate. Reglan renal dosed.   Objective Vitals:   10/02/21 1100 10/02/21 2015 10/02/21 2200 10/03/21 0500  BP:  (!) 153/72    Pulse:  93 87   Resp:  19 (!) 21   Temp:  98 F (36.7 C)    TempSrc:  Oral    SpO2:  98% 95%   Weight: (!) 149.7 kg   (!) 153.3 kg  Height: 5\' 5"  (1.651 m)      Physical Exam General: Chronically ill appearing morbidly obese female in NAD Heart:S1,S2 slightly distant (body habitus). No M/R/G  Lungs: CTAB slightly decreased in bases Abdomen: obese, soft, NABS. Mid line drain to bulb suction. Drsg intact.  Extremities:R AKA difficult to assess for stump edema as pannus in on top of stump but looks OK. LLE with trace-1+ edema. Ace Wrap L hand. Necrotic 2nd L toe.  Dialysis Access: RIJ TDC drsg intact IV Access: LSC Triple lumen SVL    Additional Objective Labs: Basic Metabolic Panel: Recent Labs  Lab 09/30/21 0500 10/01/21 0706 10/02/21 0621  NA 131* 132* 134*  K 3.4* 3.6 3.8  CL 94* 97* 97*  CO2 27 25 28   GLUCOSE 106* 105* 104*  BUN 27* 34* 18  CREATININE 5.51* 6.79* 4.69*  CALCIUM 7.5* 7.9* 7.8*  PHOS 4.1 5.3* 4.0   Liver Function Tests: Recent Labs  Lab 09/30/21 0500 10/01/21 0706 10/02/21 0621  ALBUMIN 1.8* 1.8* 1.9*   No results for input(s): "LIPASE", "AMYLASE" in the last 168 hours. CBC: Recent Labs  Lab 09/28/21 0933 09/29/21 0504 09/30/21 0500 10/01/21 0706 10/02/21 0621  WBC 33.0* 29.4* 25.0* 26.9* 25.3*  NEUTROABS 30.0*  --   --   --   --   HGB 7.5* 7.2* 7.1* 7.3* 7.3*  HCT 25.2* 24.4* 24.5* 26.2* 26.2*  MCV 95.1 96.1 96.1 98.1 99.2  PLT 276 277 274 323 309   Blood Culture    Component Value Date/Time   SDES ABSCESS 09/29/2021 1106   SPECREQUEST HEPATIC ABSCESS 09/29/2021 1106   CULT  09/29/2021  1106    NO GROWTH 3 DAYS NO ANAEROBES ISOLATED; CULTURE IN PROGRESS FOR 5 DAYS Performed at [facility] [address] [place], [place] [address]    REPTSTATUS PENDING 09/29/2021 1106    Cardiac Enzymes: No results for input(s): "CKTOTAL", "CKMB", "CKMBINDEX", "TROPONINI" in the last 168 hours. CBG: Recent Labs  Lab 10/02/21 0758 10/02/21 1137 10/02/21 1604 10/02/21 2105 10/03/21 0618  GLUCAP 98 100* 93 110* 106*   Iron Studies: No results for input(s): "IRON", "TIBC", "TRANSFERRIN", "FERRITIN" in the last 72 hours. @lablastinr3 @ Studies/Results: No results found. Medications:  sodium chloride 10 mL/hr at 10/03/21 5176   ceFEPime (MAXIPIME) IV Stopped (10/01/21 1719)   heparin 1,950 Units/hr (10/03/21 1607)   metronidazole Stopped (10/02/21 2241)    aspirin EC  81 mg Oral Daily   atorvastatin  10 mg Oral QHS   calcitRIOL  1 mcg Oral Q T,Th,Sa-HD   calcium carbonate  1,000 mg Oral TID with meals   cinacalcet  30 mg Oral Q T,Th,Sa-HD   darbepoetin (ARANESP) injection - DIALYSIS  200 mcg Intravenous Q Thu-HD   diclofenac Sodium  2 g Topical QID   feeding supplement  1 Container Oral TID BM   feeding supplement (  NEPRO CARB STEADY)  237 mL Oral BID BM   gabapentin  100 mg Oral Q12H   gabapentin  200 mg Oral Q T,Th,Sa-HD   Gerhardt's butt cream   Topical BID   insulin aspart  1-3 Units Subcutaneous TID WC   lactulose  30 g Oral TID   megestrol  40 mg Oral BID   metoCLOPramide (REGLAN) injection  5 mg Intravenous Q6H   multivitamin  1 tablet Oral QHS   pantoprazole  40 mg Oral BID   polyethylene glycol  17 g Oral BID   senna-docusate  1 tablet Oral BID   sodium chloride flush  10-40 mL Intracatheter Q12H   sodium chloride flush  3 mL Intravenous Q12H   sodium chloride flush  5 mL Intracatheter Q8H     Dialysis Orders: [name] TTS 4h 42min, BFR 400, EDW 151 kg, 1K/2.5Ca bath, TDC - 4/29 > HBsAg neg w/ Abs < 10 Hep 2,600 load and 1,500 hourly-Total dose  9275 units per treatment.  - mircera 200ug q2 due 09/10/21 - calcitriol 1 ug tiw - sensipar 30 mg tiw   Assessment/Plan: Septic shock: On admit requiring short-term pressors and again after TEE.  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 Splenic Infarct/abdominal pain: anticoagulation per primary team  Splenic/Hepatic abscesses: S/P drain placed in IR 09/29/2021. Per primary ESRD: Continue HD per usual TTS schedule. Next HD 10/05/2021. On heparin gtt-will hold heparin.  Chronic hypoxic resp failure - note that she is on 3 liters at home HTN/volume:  Optimize UF with HD. She has now reached her old EDW and may need to lower further. Last CXR wet on 6/11, wts are down near her dry wt now, will repeat CXR. Hx of refusing large UF goals.  Anemia of ESRD: HGB low. Continue Aranesp 200 mcg q Thursday. Transfused 1 unit of PRBCs with HD 09/26/2021.    She has acute illness and compounding this is on her menstrual cycle  Secondary Hyperparathyroidism: CorrCa/Phos ok, on Tums as binder. Continue VDRA + sensipar.   T2DM Protein Calorie malnutrition in setting of Morbid Obesity - Albumin very low. Add protein supplements.   Rita H. Brown NP-C 10/03/2021, 9:36 AM  [facility] [phone]  Pt seen, examined and agree w assess/plan as above with additions as indicated.  [facility] 10/04/2021, 8:47 AM

## 2021-10-03 NOTE — Progress Notes (Signed)
[place] for heparin Indication:  splenic infarct  Heparin Dosing Weight: 95 kg  Labs: Recent Labs    09/30/21 1049 10/01/21 0706 10/01/21 0847 10/02/21 0621 10/03/21 0735  HGB  --  7.3*  --  7.3*  --   HCT  --  26.2*  --  26.2*  --   PLT  --  323  --  309  --   APTT 72*  --  69* 65*  --   HEPARINUNFRC  --   --  0.57 0.39 0.20*  CREATININE  --  6.79*  --  4.69*  --     Assessment: 16 yof with hx PVD s/p R AKA 5/10 for critical limb ischemia, ESRD on HD TTS, found to have splenic infarct and transitioned from heparin infusion to apixaban on 6/14. Pharmacy consulted to transition back to heparin infusion with patient hypotensive requiring transfer to ICU s/p TEE on 6/23. Last dose of apixaban 6/23 at 1048. She is s/p IR drain placement for hepatic abscess and 6/23 noted CT finding of possible subcapsular hematoma so heparin goal was lowered.  Menorrhagia noted 6/27 and megace started. None noted by RN 6/29.  Heparin level 0.2 is subtherapeutic on 1950 units/hr.  No issues with infusion or bleeding per RN. Level has been trending down daily as apixaban is cleared. aPTT was at low end of therapeutic. H/H low stable. Note patient is a hard stick.   Goal of Therapy:  Heparin level = 0.3-0.5 Monitor platelets by anticoagulation protocol: Yes   Plan:  Increase IV heparin to 2100 units/hr  Daily heparin level and CBC D/c daily aPTT  Benetta Spar, PharmD, BCPS, [facility] Clinical Pharmacist  Please check AMION for all [place] phone numbers After 10:00 PM, call [place]

## 2021-10-03 NOTE — Progress Notes (Addendum)
[facility]                                                                              Karmyn Lowman, is a 51 y.o. female, DOB - April 11, 1970, [id number] Admit date - 09/09/2021    Outpatient Primary MD for the patient is Monico Blitz, MD  LOS - 23  days  Chief Complaint  Patient presents with   Nausea   Constipation       Brief summary   Courtney Gray is a 51 y.o. 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 She was hypoglycemic, febrile with CT revealing hypoenhancement in the spleen consistent with infarct. Started on IV heparin, empiric antibiotics for septic shock of unclear origin. Later taken for left middle finger amputation 6/19, Ultimately was taken for TEE 6/23 which revealed PFO without vegetation and required transfer to ICU for septic shock found to have hepatic and splenic abscesses. ID has guided IV antibiotics, IR placed hepatic abscess drain 6/25.    Assessment & Plan   Septic shock Bacteremia bacteroids thetaiotaomicron-beta-lactamase positive on 6/17: -Drain management per IR -No fevers, has leukocytosis, persistent -Continue IV cefepime with dialysis and oral metronidazole, ID following  Hepatic and splenic abscess -Status post CT-guided drainage of liver abscess by IR on 6/25 with purulent fluid -IR following, continue 3 times daily flushes -Will need repeat imaging/possible drain injection once output less than 10 mL/daily and consideration of drain removal if output is less than 10 mL/daily -No role for acute surgical intervention at this time per surgery  Splenic infarct -No embolic source noted on echo or CT. -TEE on 6/23 showed normal LV function, negative for endocarditis.  Calcified mass in the right atrium-etiology  unclear, possibly calcified thrombus. -Continue IV heparin, can transition to oral eliquis as no impending procedures Addendum  D/w pharmacy, eliquis was placed on hold due to subcapsular hematoma 5.9x10x8.9 - will repeat CT abd    Diabetes mellitus type 2, insulin-dependent, uncontrolled with hypoglycemia -Hemoglobin A1c 5.7% -CBGs controlled   Hypotension, likely sedation related -BP now stable and trending up.  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   - h/h stable   Menorrhagia -Declined pelvic ultrasound, continue Megace -Transfuse if hemoglobin less than 7  Demand ischemia with elevated troponin in the setting of anemia, sepsis -ACS ruled out.  Currently no chest pain.  Essential hypertension -Initially was hypotensive, requiring pressors, midodrine -BP stable.  -  resumed amlodipine 10mg   - will gradually add coreg if BP is still up     Hyperlipidemia  -Continue statin   GERD  -continue PPI  Acute on chronic ambulatory dysfunction -Continue PT OT, recommended SNF    Pressure ulcer stage II left and right buttock POA -Continue local wound care.  Change position every 2 hours  Morbid obesity Estimated body mass index is 56.24 kg/m as calculated from the following:   Height as of this encounter: 5\' 5"  (1.651 m).   Weight as of this encounter: 153.3 kg.  Code Status: Full code DVT Prophylaxis:    Level of Care: Level of care:  Med-Surg Family Communication: Updated patient   Disposition Plan:      Remains inpatient appropriate: On IV antibiotics, difficult placement.  Transfer to [place].   Procedures:  HD  Consultants:   Nephrology Infectious disease Interventional radiology General surgery  Antimicrobials:   Anti-infectives (From admission, onward)    Start     Dose/Rate Route Frequency Ordered Stop   09/25/21 0915  metroNIDAZOLE (FLAGYL) IVPB 500 mg        500 mg 100 mL/hr over 60 Minutes Intravenous Every 12 hours 09/25/21 0822     09/13/21 1000  vancomycin (VANCOCIN) IVPB 1000 mg/200 mL premix        1,000 mg 200 mL/hr over 60 Minutes Intravenous STAT 09/13/21 0904 09/13/21 1140   09/10/21 1200  vancomycin (VANCOCIN) IVPB 1000 mg/200 mL premix  Status:  Discontinued        1,000 mg 200 mL/hr over 60 Minutes Intravenous Every T-Th-Sa (Hemodialysis) 09/10/21 0620 09/17/21 1013   09/10/21 1200  ceFEPIme (MAXIPIME) 2 g in sodium chloride 0.9 % 100 mL IVPB        2 g 200 mL/hr over 30 Minutes Intravenous Every T-Th-Sa (Hemodialysis) 09/10/21 1107     09/10/21 0445  vancomycin (VANCOREADY) IVPB 2000 mg/400 mL        2,000 mg 200 mL/hr over 120 Minutes Intravenous  Once 09/10/21 0435 09/10/21 0800   09/10/21 0430  ceFEPIme (MAXIPIME) 2 g in sodium chloride 0.9 % 100 mL IVPB        2 g 200 mL/hr over 30 Minutes Intravenous  Once 09/10/21 0420 09/10/21 0550          Medications  aspirin EC  81 mg Oral Daily   atorvastatin  10 mg Oral QHS   calcitRIOL  1 mcg Oral Q T,Th,Sa-HD   calcium acetate  667 mg Oral TID WC   cinacalcet  30 mg Oral Q T,Th,Sa-HD   darbepoetin (ARANESP) injection - DIALYSIS  200 mcg Intravenous Q Thu-HD   diclofenac Sodium  2 g Topical QID   feeding supplement  1 Container Oral TID BM   feeding supplement (NEPRO CARB STEADY)  237 mL Oral BID BM   gabapentin  100 mg Oral Q12H   gabapentin  200 mg Oral Q T,Th,Sa-HD   Gerhardt's butt cream   Topical BID   insulin  aspart  1-3 Units Subcutaneous TID WC   megestrol  40 mg Oral BID   metoCLOPramide (REGLAN) injection  5 mg Intravenous Q6H   multivitamin  1 tablet Oral QHS   pantoprazole  40 mg Oral BID   polyethylene glycol  17 g Oral Daily   sodium chloride flush  10-40 mL Intracatheter Q12H   sodium chloride flush  3 mL Intravenous Q12H   sodium chloride flush  5 mL Intracatheter Q8H  Subjective:   Gessica Jawad was seen and examined today.  No acute complaints, feels weak and deconditioned. Did not sleep well.    Objective:   Vitals:   10/02/21 1100 10/02/21 2015 10/02/21 2200 10/03/21 0500  BP:  (!) 153/72    Pulse:  93 87   Resp:  19 (!) 21   Temp:  98 F (36.7 C)    TempSrc:  Oral    SpO2:  98% 95%   Weight: (!) 149.7 kg   (!) 153.3 kg  Height: 5\' 5"  (1.651 m)       Intake/Output Summary (Last 24 hours) at 10/03/2021 1151 Last data filed at 10/03/2021 4944 Gross per 24 hour  Intake 1256.78 ml  Output 30 ml  Net 1226.78 ml     Wt Readings from Last 3 Encounters:  10/03/21 (!) 153.3 kg  09/03/21 (!) 150.6 kg  08/28/21 (!) 150.1 kg   Physical Exam General: Alert and oriented x 3, NAD Cardiovascular: S1 S2 clear, RRR. Respiratory: CTAB, no wheezing, rales or rhonchi Gastrointestinal: Soft, nontender, nondistended, NBS, abdominal drain Ext: no pedal edema bilaterally Neuro: no new deficits Psych: Normal affect and demeanor, alert and oriented x3      Data Reviewed:  I have personally reviewed following labs    CBC Lab Results  Component Value Date   WBC 25.3 (H) 10/02/2021   RBC 2.64 (L) 10/02/2021   HGB 7.3 (L) 10/02/2021   HCT 26.2 (L) 10/02/2021   MCV 99.2 10/02/2021   MCH 27.7 10/02/2021   PLT 309 10/02/2021   MCHC 27.9 (L) 10/02/2021   RDW 20.2 (H) 10/02/2021   LYMPHSABS 1.0 09/28/2021   MONOABS 1.3 (H) 09/28/2021   EOSABS 0.0 09/28/2021   BASOSABS 0.0 96/75/9163     Last metabolic panel Lab Results  Component Value Date   NA 134 (L)  10/02/2021   K 3.8 10/02/2021   CL 97 (L) 10/02/2021   CO2 28 10/02/2021   BUN 18 10/02/2021   CREATININE 4.69 (H) 10/02/2021   GLUCOSE 104 (H) 10/02/2021   GFRNONAA 11 (L) 10/02/2021   GFRAA 6 (L) 11/29/2019   CALCIUM 7.8 (L) 10/02/2021   PHOS 4.0 10/02/2021   PROT 6.0 (L) 09/14/2021   ALBUMIN 1.9 (L) 10/02/2021   BILITOT 0.7 09/14/2021   ALKPHOS 124 09/14/2021   AST 16 09/14/2021   ALT 10 09/14/2021   ANIONGAP 9 10/02/2021    CBG (last 3)  Recent Labs    10/02/21 1604 10/02/21 2105 10/03/21 0618  GLUCAP 93 110* 106*          Toivo Bordon M.D. [facility] 10/03/2021, 11:51 AM  Available via Epic secure chat 7am-7pm After 7 pm, please refer to night coverage provider listed on amion.

## 2021-10-03 NOTE — Progress Notes (Signed)
[facility]  Date of Admission:  09/09/2021     Lines:  6/07-c left neck cvc catheter   Old right IJ HD catheter Old right UE AVF   Abx: 6/21-c metronidazole 6/06-c cefepime                                                              Assessment: 51 y.o. female esrd on iHD via right ij HD catheter (failed rue avf), dm2, pvd, s/p right aka, gangrenous left middle finger, admitted 6/05 with acute left upper quadrant pain with ct imaging splenic infarct, since with persistent leukocytosis despite bsAbx, for which ID is now involved   Leukocytosis bacteremia Splenic infarcts Peripheral gangrene Esrd on iHD     I am not sure what to make of the persistent moderate-significantly elevated wbc. Initially might have had some cellulitis of the left middle finger with the isolated fever and significant leukemoid reaction. Started on cefepime but never had complete resolution of leukocytosis   Has splenic infarct as well and I query if this is one of the several factors driving her leukocytosis. The left middle finger and left 2nd toe dry gangrene could be contributing to leukocytosis too.    She underwent left middle finger amputation and no obvious sign of infection found. The tissue/bone wasn't sent for culture. She remains on cefepime since admission and the wbc has improved since 6/19 even before metronidazole was added for 6/17 blood cx which came back on 6/21 with one of 2 set GNR in anaerobic bottle   She has left central line placed this admission and also right HD line so that could be the source of gnr but I doubt it     While it appears the peripheral gangrene is due to PVD, I don't yet know how to explain her splenic infarct. She is rather obese and the "benign" tte is not helpful   I would like to make sure there is no valvular source causing splenic infarct. Literature does suggest that "splenic infarct" often subsequently got diagnosed as  lymphoma so we can at least entertain that. And lastly did she develop an occult intraabdominal abscess since admission?  -------------------- 6/29 assessment S/p IR placement hepatic drain 6/25 minimal output currently Tee no vegetation Bcx from 6/17 bacteroides species Repeat 6/23 ct abd showed hepatic abscess and splenic abscess vs hematoma  Drain abscess 6/25 culture negative  She has remained the same subjectively even prior to drain placement, despite ct scan showed progression of disease Wbc remains very high, and I worry still about the undrained/undiagnosed splenic fluid collection (probably abscess but .Marland KitchenMarland Kitchen)   I would repeat ct scan again next week. If the splenic process is larger or symptomatic with significant wbc elevation would discuss case with IR and surgery again      Plan: Continue cefepime/flagyl Repeat ct abd pelv with contrast this Monday  Discussed with primary team   I spent more than 35 minute reviewing data/chart, and coordinating care and >50% direct face to face time providing counseling/discussing diagnostics/treatment plan with patient   Principal Problem:   Splenic infarction Active Problems:   Uncontrolled type 2 diabetes mellitus with hypoglycemia, with long-term current use of insulin ([place])   Essential  hypertension   ESRD (end stage renal disease) on dialysis (HCC)   Chronic respiratory failure with hypoxia (HCC)   Leukocytosis   Dry gangrene (HCC)   Pneumonia of left lower lobe due to infectious organism   Lactic acidosis   Mixed diabetic hyperlipidemia associated with type 2 diabetes mellitus (HCC)   Elevated troponin level not due myocardial infarction   Hypotension   PVD (peripheral vascular disease) (HCC)   Hx of AKA (above knee amputation), right (HCC)   Pressure injury of skin   Hepatic abscess   Allergies  Allergen Reactions   Benzonatate Other (See Comments)    Hallucinations   Hydralazine Other (See Comments)     Hallucinations   Amoxicillin Itching and Nausea And Vomiting   Clonidine Other (See Comments)    Altered mental state, insomnia    Chlorhexidine Itching    Patient reports it is the CHG baths that cause her itching not the CHG used for caring for her IV/HD access.     Morphine Itching   Oxycodone Itching    Pt tolerated hospital admission 07/2021    Scheduled Meds:  amLODipine  10 mg Oral Daily   aspirin EC  81 mg Oral Daily   atorvastatin  10 mg Oral QHS   calcitRIOL  1 mcg Oral Q T,Th,Sa-HD   calcium acetate  667 mg Oral TID WC   cinacalcet  30 mg Oral Q T,Th,Sa-HD   darbepoetin (ARANESP) injection - DIALYSIS  200 mcg Intravenous Q Thu-HD   diclofenac Sodium  2 g Topical QID   feeding supplement  1 Container Oral TID BM   feeding supplement (NEPRO CARB STEADY)  237 mL Oral BID BM   gabapentin  100 mg Oral Q12H   gabapentin  200 mg Oral Q T,Th,Sa-HD   Gerhardt's butt cream   Topical BID   insulin aspart  1-3 Units Subcutaneous TID WC   megestrol  40 mg Oral BID   metoCLOPramide (REGLAN) injection  5 mg Intravenous Q6H   multivitamin  1 tablet Oral QHS   pantoprazole  40 mg Oral BID   polyethylene glycol  17 g Oral Daily   sodium chloride flush  10-40 mL Intracatheter Q12H   sodium chloride flush  3 mL Intravenous Q12H   sodium chloride flush  5 mL Intracatheter Q8H   Continuous Infusions:  sodium chloride 10 mL/hr at 10/03/21 0611   ceFEPime (MAXIPIME) IV Stopped (10/01/21 1719)   heparin 2,100 Units/hr (10/03/21 0944)   metronidazole 500 mg (10/03/21 0959)   PRN Meds:.acetaminophen **OR** acetaminophen, albuterol, fentaNYL, glucagon (human recombinant), glucose, HYDROmorphone, labetalol, lactulose, lidocaine (PF), lidocaine-prilocaine, midazolam, nitroGLYCERIN, ondansetron **OR** ondansetron (ZOFRAN) IV, pentafluoroprop-tetrafluoroeth, prochlorperazine, senna-docusate   SUBJECTIVE: No complaint Still pretty much bed bound/weak  No f/c Wbc remains high  No  vomiting/diarrhea  Drain about 10-20 per day     Review of Systems: ROS All other ROS was negative, except mentioned above     OBJECTIVE: Vitals:   10/02/21 2015 10/02/21 2200 10/03/21 0500 10/03/21 1428  BP: (!) 153/72   (!) 153/81  Pulse: 93 87  83  Resp: 19 (!) 21  20  Temp: 98 F (36.7 C)   98.6 F (37 C)  TempSrc: Oral   Oral  SpO2: 98% 95%  97%  Weight:   (!) 153.3 kg   Height:       Body mass index is 56.24 kg/m.  Physical Exam   General/constitutional: no distress, pleasant HEENT: Normocephalic, PER, Conj Clear,  EOMI, Oropharynx clear Neck supple CV: rrr no mrg Lungs: clear to auscultation, normal respiratory effort Abd: Soft, Nontender -- hepatic drain with murky serosanguinous thick fluid in jp bulb Ext: no edema Skin: No Rash Neuro: generalzied weakness  MSK: stable distal left 2nd toe dry gangrene with toe tender to touch without swelling; left hand middle finger s/p amputation; right LLE aka stump all healed  Lab Results Lab Results  Component Value Date   WBC 25.3 (H) 10/02/2021   HGB 7.3 (L) 10/02/2021   HCT 26.2 (L) 10/02/2021   MCV 99.2 10/02/2021   PLT 309 10/02/2021    Lab Results  Component Value Date   CREATININE 4.69 (H) 10/02/2021   BUN 18 10/02/2021   NA 134 (L) 10/02/2021   K 3.8 10/02/2021   CL 97 (L) 10/02/2021   CO2 28 10/02/2021    Lab Results  Component Value Date   ALT 10 09/14/2021   AST 16 09/14/2021   ALKPHOS 124 09/14/2021   BILITOT 0.7 09/14/2021      Microbiology: Recent Results (from the past 240 hour(s))  Surgical pcr screen     Status: None   Collection Time: 09/23/21  5:57 PM   Specimen: Nasal Mucosa; Nasal Swab  Result Value Ref Range Status   MRSA, PCR NEGATIVE NEGATIVE Final   Staphylococcus aureus NEGATIVE NEGATIVE Final    Comment: (NOTE) The Xpert SA Assay (FDA approved for NASAL specimens in patients 24 years of age and older), is one component of a comprehensive surveillance program.  It is not intended to diagnose infection nor to guide or monitor treatment. Performed at [facility], [place] [address] [place], [place] [address]   Culture, blood (Routine X 2) w Reflex to ID Panel     Status: None   Collection Time: 09/27/21  6:53 PM   Specimen: BLOOD  Result Value Ref Range Status   Specimen Description BLOOD BLOOD RIGHT HAND  Final   Special Requests IN PEDIATRIC BOTTLE Blood Culture adequate volume  Final   Culture   Final    NO GROWTH 5 DAYS Performed at [facility], [place] [address], [place], [place] [address]    Report Status 10/02/2021 FINAL  Final  Culture, blood (Routine X 2) w Reflex to ID Panel     Status: None   Collection Time: 09/27/21  6:53 PM   Specimen: BLOOD  Result Value Ref Range Status   Specimen Description BLOOD BLOOD RIGHT ARM  Final   Special Requests   Final    BOTTLES DRAWN AEROBIC AND ANAEROBIC Blood Culture adequate volume   Culture   Final    NO GROWTH 5 DAYS Performed at [facility], [place] [address], [place], [place] [address]    Report Status 10/02/2021 FINAL  Final  Aerobic/Anaerobic Culture w Gram Stain (surgical/deep wound)     Status: None (Preliminary result)   Collection Time: 09/29/21 11:06 AM   Specimen: Abscess  Result Value Ref Range Status   Specimen Description ABSCESS  Final   Special Requests HEPATIC ABSCESS  Final   Gram Stain   Final    ABUNDANT WBC PRESENT,BOTH PMN AND MONONUCLEAR NO ORGANISMS SEEN    Culture   Final    NO GROWTH 4 DAYS NO ANAEROBES ISOLATED; CULTURE IN PROGRESS FOR 5 DAYS Performed at [facility], [place] [address], [place], [place] [address]    Report Status PENDING  Incomplete     Serology:  Imaging: If present, new imagings (plain films, ct scans, and mri) have been personally visualized and interpreted; radiology reports have been reviewed. Decision making incorporated into the Impression / Recommendations.   6/23 ct abd/pelv with contrast 1. There is a  new 5.1 x 5.7 x 6.1 cm hypodense area in the subcapsular left lobe of the liver. Findings are concerning for abscess given the clinical history. 2. 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 Findings may represent subcapsular hematoma given recent infarcts,but splenic abscess is also in the differential given findings in the liver. 3. Bilateral renal atrophy. Increasing perinephric stranding and fluid may represent infection. 4. New small left pleural effusion. 5. Aortic Atherosclerosis    6/23 tee 1. Dialysis catheter noted in SVC; calcified right atrial mass noted (etiology unclear; possible calcified thrombus); if pt can tolerate, cardiac MRI could provide more definitive evaluation.   2. Left ventricular ejection fraction, by estimation, is 65 to 70%. The left ventricle has normal function. The left ventricle has no regional wall motion abnormalities.   3. Right ventricular systolic function is normal. The right ventricular size is normal.   4. Left atrial size was mildly dilated. No left atrial/left atrial  appendage thrombus was detected.   5. Right atrial size was mildly dilated.   6. The mitral valve is normal in structure. Mild mitral valve  regurgitation. Moderate mitral annular calcification.   7. Tricuspid valve regurgitation is moderate.   8. The aortic valve is tricuspid. Aortic valve regurgitation is not visualized. Aortic valve sclerosis is present, with no evidence of aortic valve stenosis.   9. There is mild (Grade II) plaque involving the descending aorta.  10. Evidence of atrial level shunting detected by color flow Doppler. Agitated saline contrast bubble study was positive with shunting observed  within 3-6 cardiac cycles suggestive of interatrial shunt.      6/08 ct chest angio 1. No evidence for pulmonary embolism. 2. Small amount of atelectasis and airspace disease in the  leftlower lobe worrisome for infection. 3. Trace bilateral pleural effusions. 4. Enteric tube tip is in the proximal thoracic esophagus. Recommend repositioning. Aortic Atherosclerosis       6/06 ct abd with contrast 1. Hypoenhancing bands in the spleen with a small amount of perisplenic free fluid, suggesting infarcts. Laceration or rupture could also be considered if there is a history of trauma. 2. Trace left pleural effusion with atelectasis or infiltrate. 3. Bilateral renal atrophy. 4. Aortic atherosclerosis.   Jabier Mutton, [facility] [place] [phone] pager    10/03/2021, 2:51 PM

## 2021-10-03 NOTE — Progress Notes (Signed)
[facility] for heparin Indication:  splenic infarct  Heparin Dosing Weight: 95 kg  Labs: Recent Labs    10/01/21 0706 10/01/21 0847 10/01/21 0847 10/02/21 0621 10/03/21 0735 10/03/21 1623 10/03/21 1624 10/03/21 1757  HGB 7.3*  --   --  7.3*  --   --  7.5*  --   HCT 26.2*  --   --  26.2*  --   --  25.9*  --   PLT 323  --   --  309  --   --  324  --   APTT  --  69*  --  65*  --   --   --   --   HEPARINUNFRC  --  0.57   < > 0.39 0.20*  --   --  0.35  CREATININE 6.79*  --   --  4.69*  --  6.24*  --   --    < > = values in this interval not displayed.     Assessment: 54 yof with hx PVD s/p R AKA 5/10 for critical limb ischemia, ESRD on HD TTS, found to have splenic infarct and transitioned from heparin infusion to apixaban on 6/14. Pharmacy consulted to transition back to heparin infusion with patient hypotensive requiring transfer to ICU s/p TEE on 6/23. Last dose of apixaban 6/23 at 1048. She is s/p IR drain placement for hepatic abscess and 6/23 noted CT finding of possible subcapsular hematoma so heparin goal was lowered.  Menorrhagia noted 6/27 and megace started. None noted by RN 6/29.  Heparin level 0.2 is subtherapeutic on 1950 units/hr.  No issues with infusion or bleeding per RN. Level has been trending down daily as apixaban is cleared. aPTT was at low end of therapeutic. H/H low stable. Note patient is a hard stick.   Heparin level came back in range tonight. Was drawn appropriately. Will cont with same rate and check confirm level in Am.   Goal of Therapy:  Heparin level = 0.3-0.5 Monitor platelets by anticoagulation protocol: Yes   Plan:  Cont IV heparin 2100 units/hr  Daily heparin level and CBC  Onnie Boer, PharmD, BCIDP, AAHIVP, CPP Infectious Disease Pharmacist 10/03/2021 6:40 PM

## 2021-10-04 ENCOUNTER — Inpatient Hospital Stay (HOSPITAL_COMMUNITY): Payer: 59

## 2021-10-04 DIAGNOSIS — I96 Gangrene, not elsewhere classified: Secondary | ICD-10-CM | POA: Diagnosis not present

## 2021-10-04 DIAGNOSIS — R778 Other specified abnormalities of plasma proteins: Secondary | ICD-10-CM | POA: Diagnosis not present

## 2021-10-04 DIAGNOSIS — D735 Infarction of spleen: Secondary | ICD-10-CM | POA: Diagnosis not present

## 2021-10-04 DIAGNOSIS — J9611 Chronic respiratory failure with hypoxia: Secondary | ICD-10-CM | POA: Diagnosis not present

## 2021-10-04 LAB — AEROBIC/ANAEROBIC CULTURE W GRAM STAIN (SURGICAL/DEEP WOUND): Culture: NO GROWTH

## 2021-10-04 LAB — CBC
HCT: 26.6 % — ABNORMAL LOW (ref 36.0–46.0)
Hemoglobin: 7.5 g/dL — ABNORMAL LOW (ref 12.0–15.0)
MCH: 27.2 pg (ref 26.0–34.0)
MCHC: 28.2 g/dL — ABNORMAL LOW (ref 30.0–36.0)
MCV: 96.4 fL (ref 80.0–100.0)
Platelets: 317 10*3/uL (ref 150–400)
RBC: 2.76 MIL/uL — ABNORMAL LOW (ref 3.87–5.11)
RDW: 20.3 % — ABNORMAL HIGH (ref 11.5–15.5)
WBC: 23.4 10*3/uL — ABNORMAL HIGH (ref 4.0–10.5)
nRBC: 0.3 % — ABNORMAL HIGH (ref 0.0–0.2)

## 2021-10-04 LAB — RENAL FUNCTION PANEL
Albumin: 2 g/dL — ABNORMAL LOW (ref 3.5–5.0)
Anion gap: 11 (ref 5–15)
BUN: 13 mg/dL (ref 6–20)
CO2: 29 mmol/L (ref 22–32)
Calcium: 8.1 mg/dL — ABNORMAL LOW (ref 8.9–10.3)
Chloride: 94 mmol/L — ABNORMAL LOW (ref 98–111)
Creatinine, Ser: 3.68 mg/dL — ABNORMAL HIGH (ref 0.44–1.00)
GFR, Estimated: 14 mL/min — ABNORMAL LOW (ref >60)
Glucose, Bld: 90 mg/dL (ref 70–99)
Phosphorus: 3.2 mg/dL (ref 2.5–4.6)
Potassium: 3.6 mmol/L (ref 3.5–5.1)
Sodium: 134 mmol/L — ABNORMAL LOW (ref 135–145)

## 2021-10-04 LAB — HEPARIN LEVEL (UNFRACTIONATED): Heparin Unfractionated: 0.43 IU/mL (ref 0.30–0.70)

## 2021-10-04 LAB — GLUCOSE, CAPILLARY
Glucose-Capillary: 95 mg/dL (ref 70–99)
Glucose-Capillary: 93 mg/dL (ref 70–99)
Glucose-Capillary: 133 mg/dL — ABNORMAL HIGH (ref 70–99)
Glucose-Capillary: 105 mg/dL — ABNORMAL HIGH (ref 70–99)

## 2021-10-04 MED ORDER — DARBEPOETIN ALFA 200 MCG/0.4ML IJ SOSY
200.0000 ug | PREFILLED_SYRINGE | INTRAMUSCULAR | Status: DC
  Administered 2021-10-05 – 2021-10-19 (×3): 200 ug via INTRAVENOUS
  Filled 2021-10-04 (×6): qty 0.4

## 2021-10-04 NOTE — Progress Notes (Signed)
Supervising Physician: Arne Cleveland  Patient Status:  [facility] - In-pt  Chief Complaint:   Hepatic abscess drain placement 09/29/21 with Dr. Kathlene Cote  Subjective:  Pt resting in bed. She denies pain/tenderness at insertion site.  Allergies: Benzonatate, Hydralazine, Amoxicillin, Clonidine, Chlorhexidine, Morphine, and Oxycodone  Medications: Prior to Admission medications   Medication Sig Start Date End Date Taking? Authorizing Provider  acetaminophen (TYLENOL) 500 MG tablet Take 1,000 mg by mouth as needed for moderate pain.   Yes [provider]  amLODipine (NORVASC) 10 MG tablet Take 1 tablet (10 mg total) by mouth daily. Patient taking differently: Take 10 mg by mouth daily as needed (Blood pressusre). 11/29/20  Yes Roxan Hockey, MD  aspirin EC 81 MG tablet Take 1 tablet (81 mg total) by mouth daily with breakfast. 11/28/20  Yes Emokpae, Courage, MD  atorvastatin (LIPITOR) 10 MG tablet Take 1 tablet (10 mg total) by mouth daily. Patient taking differently: Take 10 mg by mouth at bedtime. 11/28/20  Yes Emokpae, Courage, MD  calcium carbonate (TUMS SMOOTHIES) 750 MG chewable tablet Chew 1,500 mg by mouth See admin instructions. Take 1500 mg with each meal and 1500 mg with each snack   Yes [provider]  carvedilol (COREG) 6.25 MG tablet Take 1 tablet (6.25 mg total) by mouth 2 (two) times daily with a meal. Patient taking differently: Take 6.25 mg by mouth 2 (two) times daily as needed (if BP > 150). 08/28/21 09/27/21 Yes Pahwani, Einar Grad, MD  diphenhydrAMINE (BENADRYL) 25 MG tablet Take 25 mg by mouth 2 (two) times daily as needed for allergies.   Yes [provider]  docusate sodium (COLACE) 100 MG capsule Take 100 mg by mouth 2 (two) times daily.   Yes [provider]  gabapentin (NEURONTIN) 100 MG capsule Take 1 capsule (100 mg total) by mouth 2 (two) times daily. Patient taking differently: Take 100 mg by mouth See admin instructions. Take  100 mg by mouth twice a day on non dialysis days. On dialysis day take 100 mg in the morning, 200 mg after dialysis, and then 100 mg at bedtime. 08/28/21 09/27/21 Yes Pahwani, Einar Grad, MD  glipiZIDE (GLUCOTROL XL) 2.5 MG 24 hr tablet Take 1 tablet (2.5 mg total) by mouth daily with breakfast. 08/28/21 09/27/21 Yes Pahwani, Einar Grad, MD  HYDROcodone-acetaminophen (NORCO/VICODIN) 5-325 MG tablet Take 1-2 tablets by mouth every 4 (four) hours as needed for moderate pain. Patient taking differently: Take 1 tablet by mouth every 4 (four) hours as needed for moderate pain. 08/19/21  Yes Pahwani, Einar Grad, MD  insulin glargine (LANTUS) 100 UNIT/ML Solostar Pen Inject 12 Units into the skin daily.   Yes [provider]  loperamide (IMODIUM A-D) 2 MG tablet Take 4 mg by mouth as needed for diarrhea or loose stools.   Yes [provider]  nitroGLYCERIN (NITROSTAT) 0.4 MG SL tablet Place 1 tablet (0.4 mg total) under the tongue every 5 (five) minutes as needed for chest pain. 05/24/21  Yes [place] Eric J, DO  pantoprazole (PROTONIX) 40 MG tablet Take 1 tablet (40 mg total) by mouth daily. 11/29/20  Yes Roxan Hockey, MD  simethicone (MYLICON) 962 MG chewable tablet Chew 125 mg by mouth in the morning and at bedtime.   Yes [provider]  tiZANidine (ZANAFLEX) 4 MG capsule Take 4 mg by mouth in the morning and at bedtime.   Yes [provider]  doxycycline (VIBRAMYCIN) 100 MG capsule Take 100 mg by mouth 2 (two) times daily.  Patient not taking: Reported on 09/10/2021 08/30/21   [provider]  lisinopril (ZESTRIL) 20 MG tablet Take 1 tablet (20 mg total) by mouth daily. Patient not taking: Reported on 09/10/2021 08/28/21 09/27/21  Darliss Cheney, MD  traMADol (ULTRAM) 50 MG tablet Take 1 tablet (50 mg total) by mouth every 6 (six) hours as needed for moderate pain. Patient not taking: Reported on 09/10/2021 08/28/21   Darliss Cheney, MD    Vital Signs: BP (!) 147/74 (BP Location: Right  Wrist)   Pulse 90   Temp 98.4 F (36.9 C) (Oral)   Resp (!) 22   Ht 5\' 5"  (1.651 m)   Wt (!) 335 lb 1.6 oz (152 kg)   SpO2 99%   BMI 55.76 kg/m   Physical Exam Vitals reviewed.  Constitutional:      General: She is not in acute distress.    Appearance: She is ill-appearing.  HENT:     Head: Normocephalic and atraumatic.  Eyes:     Extraocular Movements: Extraocular movements intact.  Cardiovascular:     Rate and Rhythm: Normal rate.  Pulmonary:     Effort: Pulmonary effort is normal. No respiratory distress.  Abdominal:     Comments: Drain flushes/aspirates easily. Site unremarkable with sutures/statlock in place.  Dressing C/D/I.    Skin:    General: Skin is warm and dry.  Neurological:     Mental Status: She is alert and oriented to person, place, and time.  Psychiatric:        Mood and Affect: Mood normal.        Behavior: Behavior normal.     Imaging: DG CHEST PORT 1 VIEW  Result Date: 10/04/2021 CLINICAL DATA:  Pulmonary edema.  ESRD. EXAM: PORTABLE CHEST 1 VIEW COMPARISON:  September 15, 2021. FINDINGS: Moderate left pleural effusion. Overlying left basilar opacities. No overt pulmonary edema. No visible pneumothorax. Enlarged cardiac silhouette. Pulmonary vascular congestion. Similar position of right and left IJ central venous catheters. IMPRESSION: 1. Moderate left pleural effusion with overlying left basilar atelectasis and/or consolidation. 2. Cardiomegaly and pulmonary vascular congestion. No overt pulmonary edema. Electronically Signed   By: Margaretha Sheffield M.D.   On: 10/04/2021 09:20    Labs:  CBC: Recent Labs    10/01/21 0706 10/02/21 0621 10/03/21 1624 10/04/21 0726  WBC 26.9* 25.3* 24.1* 23.4*  HGB 7.3* 7.3* 7.5* 7.5*  HCT 26.2* 26.2* 25.9* 26.6*  PLT 323 309 324 317     COAGS: Recent Labs    07/10/21 0627 09/10/21 0403 09/28/21 0151 09/30/21 0500 09/30/21 1049 10/01/21 0847 10/02/21 0621  INR 1.3* 1.3*  --   --   --   --   --   APTT   --   --    < > 85* 72* 69* 65*   < > = values in this interval not displayed.     BMP: Recent Labs    10/01/21 0706 10/02/21 0621 10/03/21 1623 10/04/21 0726  NA 132* 134* 130* 134*  K 3.6 3.8 4.0 3.6  CL 97* 97* 95* 94*  CO2 25 28 25 29   GLUCOSE 105* 104* 113* 90  BUN 34* 18 27* 13  CALCIUM 7.9* 7.8* 7.8* 8.1*  CREATININE 6.79* 4.69* 6.24* 3.68*  GFRNONAA 7* 11* 8* 14*     LIVER FUNCTION TESTS: Recent Labs    09/03/21 2040 09/10/21 0031 09/10/21 0920 09/11/21 0324 09/14/21 0636 09/19/21 0915 10/01/21 0706 10/02/21 0621 10/03/21 1623 10/04/21 0726  BILITOT 0.4 0.7  --  0.8 0.7  --   --   --   --   --   AST 23 18  --  30 16  --   --   --   --   --   ALT 16 11  --  11 10  --   --   --   --   --   ALKPHOS 77 79  --  76 124  --   --   --   --   --   PROT 7.7 7.8  --  7.1 6.0*  --   --   --   --   --   ALBUMIN 3.0* 2.8*   < > 2.4* 1.9*  1.9*   < > 1.8* 1.9* 1.9* 2.0*   < > = values in this interval not displayed.     Assessment and Plan:  Drain flushes/aspirates easily. Site unremarkable with sutures/statlock in place. Dressing C/D/I. 15cc OP last 24 hours  Continue documenting OP in Epic q shift.  Continue flushing TID.  Change dressing q shift or as needed.   Please call IR if difficulty flushing or sudden change in output.     Electronically Signed: Pasty Spillers, PA 10/04/2021, 2:40 PM   I spent a total of 15 Minutes at the the patient's bedside AND on the patient's hospital floor or unit, greater than 50% of which was counseling/coordinating care for hepatic abscess drain.

## 2021-10-04 NOTE — Progress Notes (Signed)
Patient returned from HD, pain treated. Comfort afforded, will continue to monitor.

## 2021-10-04 NOTE — Progress Notes (Signed)
[facility]                                                                              Courtney Gray, is a 51 y.o. female, DOB - [birth year], JOA:[id number] Admit date - 09/09/2021    Outpatient Primary MD for the patient is Monico Blitz, MD  LOS - 24  days  Chief Complaint  Patient presents with   Nausea   Constipation       Brief summary   Courtney Gray is a 51 y.o. 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 She was hypoglycemic, febrile with CT revealing hypoenhancement in the spleen consistent with infarct. Started on IV heparin, empiric antibiotics for septic shock of unclear origin. Later taken for left middle finger amputation 6/19, Ultimately was taken for TEE 6/23 which revealed PFO without vegetation and required transfer to ICU for septic shock found to have hepatic and splenic abscesses. ID has guided IV antibiotics, IR placed hepatic abscess drain 6/25.    Assessment & Plan   Septic shock Bacteremia bacteroids thetaiotaomicron-beta-lactamase positive on 6/17: -Drain management per IR -No fevers, has leukocytosis, persistent -Continue IV cefepime with dialysis and oral metronidazole, ID following  Hepatic and splenic abscess -Status post CT-guided drainage of liver abscess by IR on 6/25 with purulent fluid -IR following, continue 3 times daily flushes -Will need repeat imaging/possible drain injection once output less than 10 mL/daily and consideration of drain removal if output is less than 10 mL/daily -No role for acute surgical intervention at this time per surgery  Splenic infarct -No embolic source noted on echo or CT. -TEE on 6/23 showed normal LV function, negative for endocarditis.  Calcified mass in the right atrium-etiology  unclear, possibly calcified thrombus. -eliquis was placed on hold due to subcapsular hematoma 5.9x10x8.9 -Discussed with ID, will repeat CT abd on Monday.  If splenic process larger or symptomatic with worsening leukocytosis, will request IR/surgery evaluation.   Diabetes mellitus type 2, insulin-dependent, uncontrolled with hypoglycemia -Hemoglobin A1c 5.7% Recent Labs    10/02/21 2105 10/03/21 0618 10/03/21 1600 10/03/21 2005 10/04/21 0744 10/04/21 1128  GLUCAP 110* 106* 117* 154* 95 93   -   Hypotension, likely sedation related -BP now stable and trending up.  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   -H&H stable  Menorrhagia -Declined pelvic ultrasound, patient was placed on Megace.  No further menorrhagia -Transfuse if hemoglobin less than  7  Demand ischemia with elevated troponin in the setting of anemia, sepsis -ACS ruled out.  Currently no chest pain.  Essential hypertension -Initially was hypotensive, requiring pressors, midodrine -BP stable.  - resumed amlodipine 10mg ,  will gradually add coreg if BP is still up     Hyperlipidemia  -Continue statin   GERD  -continue PPI  Acute on chronic ambulatory dysfunction -Continue PT OT, recommended SNF    Pressure ulcer stage II left and right buttock POA -Continue local wound care.  Change position every 2 hours  Morbid obesity Estimated body mass index is 55.76 kg/m as calculated  from the following:   Height as of this encounter: 5\' 5"  (1.651 m).   Weight as of this encounter: 152 kg.  Code Status: Full code DVT Prophylaxis:    Level of Care: Level of care: Med-Surg Family Communication: Updated patient   Disposition Plan:      Remains inpatient appropriate: On IV antibiotics, difficult placement.  Transfer to [place].   Procedures:  HD  Consultants:   Nephrology Infectious disease Interventional radiology General surgery  Antimicrobials:   Anti-infectives (From admission, onward)    Start     Dose/Rate Route Frequency Ordered Stop   09/25/21 0915  metroNIDAZOLE (FLAGYL) IVPB 500 mg        500 mg 100 mL/hr over 60 Minutes Intravenous Every 12 hours 09/25/21 0822     09/13/21 1000  vancomycin (VANCOCIN) IVPB 1000 mg/200 mL premix        1,000 mg 200 mL/hr over 60 Minutes Intravenous STAT 09/13/21 0904 09/13/21 1140   09/10/21 1200  vancomycin (VANCOCIN) IVPB 1000 mg/200 mL premix  Status:  Discontinued        1,000 mg 200 mL/hr over 60 Minutes Intravenous Every T-Th-Sa (Hemodialysis) 09/10/21 0620 09/17/21 1013   09/10/21 1200  ceFEPIme (MAXIPIME) 2 g in sodium chloride 0.9 % 100 mL IVPB        2 g 200 mL/hr over 30 Minutes Intravenous Every T-Th-Sa (Hemodialysis) 09/10/21 1107     09/10/21 0445  vancomycin (VANCOREADY) IVPB 2000 mg/400 mL        2,000 mg 200 mL/hr over 120 Minutes Intravenous  Once 09/10/21 0435 09/10/21 0800   09/10/21 0430  ceFEPIme (MAXIPIME) 2 g in sodium chloride 0.9 % 100 mL IVPB        2 g 200 mL/hr over 30 Minutes Intravenous  Once 09/10/21 0420 09/10/21 0550          Medications  amLODipine  10 mg Oral Daily   aspirin EC  81 mg Oral Daily   atorvastatin  10 mg Oral QHS   calcitRIOL  1 mcg Oral Q T,Th,Sa-HD   calcium acetate  667 mg Oral TID WC   cinacalcet  30 mg Oral Q T,Th,Sa-HD   [START ON 10/05/2021] darbepoetin (ARANESP) injection - DIALYSIS  200 mcg Intravenous Q Sat-HD   diclofenac Sodium  2 g Topical  QID   feeding supplement  1 Container Oral TID BM   feeding supplement (NEPRO CARB STEADY)  237 mL Oral BID BM   gabapentin  100 mg Oral Q12H   gabapentin  200 mg Oral Q T,Th,Sa-HD   Gerhardt's butt cream   Topical BID   insulin aspart  1-3 Units Subcutaneous TID WC   megestrol  40 mg Oral BID   metoCLOPramide (REGLAN) injection  5 mg Intravenous Q6H   multivitamin  1 tablet Oral QHS   pantoprazole  40 mg Oral  BID   polyethylene glycol  17 g Oral Daily   sodium chloride flush  10-40 mL Intracatheter Q12H   sodium chloride flush  3 mL Intravenous Q12H   sodium chloride flush  5 mL Intracatheter Q8H      Subjective:   Courtney Gray was seen and examined today.  Sitting up in the chair, no acute complaints, poor historian  Objective:   Vitals:   10/04/21 0110 10/04/21 0120 10/04/21 0200 10/04/21 0559  BP: (!) 149/73 (!) 159/88  (!) 147/74  Pulse:   90 90  Resp:  18  (!) 22  Temp: 97.9 F (36.6 C) 97.9 F (36.6 C)  98.4 F (36.9 C)  TempSrc: Oral Oral  Oral  SpO2:   100% 99%  Weight:  (!) 151.4 kg  (!) 152 kg  Height:        Intake/Output Summary (Last 24 hours) at 10/04/2021 1442 Last data filed at 10/04/2021 1000 Gross per 24 hour  Intake 1109.19 ml  Output 15 ml  Net 1094.19 ml     Wt Readings from Last 3 Encounters:  10/04/21 (!) 152 kg  09/03/21 (!) 150.6 kg  08/28/21 (!) 150.1 kg   Physical Exam General: Alert and oriented x 3, NAD, poor historian Cardiovascular: S1 S2 clear, RRR.  Respiratory: CTAB, no wheezing, Gastrointestinal: Soft, nontender, nondistended, NBS, abdominal drain Ext: no pedal edema bilaterally Neuro: no new deficits Psych: Normal affect and demeanor, alert and oriented x3       Data Reviewed:  I have personally reviewed following labs    CBC Lab Results  Component Value Date   WBC 23.4 (H) 10/04/2021   RBC 2.76 (L) 10/04/2021   HGB 7.5 (L) 10/04/2021   HCT 26.6 (L) 10/04/2021   MCV 96.4 10/04/2021   MCH 27.2  10/04/2021   PLT 317 10/04/2021   MCHC 28.2 (L) 10/04/2021   RDW 20.3 (H) 10/04/2021   LYMPHSABS 1.0 09/28/2021   MONOABS 1.3 (H) 09/28/2021   EOSABS 0.0 09/28/2021   BASOSABS 0.0 26/83/4196     Last metabolic panel Lab Results  Component Value Date   NA 134 (L) 10/04/2021   K 3.6 10/04/2021   CL 94 (L) 10/04/2021   CO2 29 10/04/2021   BUN 13 10/04/2021   CREATININE 3.68 (H) 10/04/2021   GLUCOSE 90 10/04/2021   GFRNONAA 14 (L) 10/04/2021   GFRAA 6 (L) 11/29/2019   CALCIUM 8.1 (L) 10/04/2021   PHOS 3.2 10/04/2021   PROT 6.0 (L) 09/14/2021   ALBUMIN 2.0 (L) 10/04/2021   BILITOT 0.7 09/14/2021   ALKPHOS 124 09/14/2021   AST 16 09/14/2021   ALT 10 09/14/2021   ANIONGAP 11 10/04/2021    CBG (last 3)  Recent Labs    10/03/21 2005 10/04/21 0744 10/04/21 1128  GLUCAP 154* 95 93       Courtney Gray M.D. [facility] 10/04/2021, 2:42 PM  Available via Epic secure chat 7am-7pm After 7 pm, please refer to night coverage provider listed on amion.

## 2021-10-04 NOTE — Progress Notes (Addendum)
[place] for heparin Indication:  splenic infarct  Heparin Dosing Weight: 95 kg  Labs: Recent Labs    10/02/21 0621 10/03/21 0735 10/03/21 1623 10/03/21 1624 10/03/21 1757 10/04/21 0726  HGB 7.3*  --   --  7.5*  --  7.5*  HCT 26.2*  --   --  25.9*  --  26.6*  PLT 309  --   --  324  --  317  APTT 65*  --   --   --   --   --   HEPARINUNFRC 0.39 0.20*  --   --  0.35 0.43  CREATININE 4.69*  --  6.24*  --   --  3.68*    Assessment: 38 yof with hx PVD s/p R AKA 5/10 for critical limb ischemia, ESRD on HD TTS, found to have splenic infarct and transitioned from heparin infusion to apixaban on 6/14. Pharmacy consulted to transition back to heparin infusion with patient hypotensive requiring transfer to ICU s/p TEE on 6/23. Last dose of apixaban 6/23 at 1048. She is s/p IR drain placement for hepatic abscess and 6/23 noted CT finding of possible subcapsular hematoma so heparin goal was lowered.  Menorrhagia noted 6/27 and megace started. None noted by RN 6/29.  Heparin level 0.43  is therapeutic on 2100 units/hr. H/H low stable. Note patient is a hard stick.   Goal of Therapy:  Heparin level = 0.3-0.5 Monitor platelets by anticoagulation protocol: Yes   Plan:  Continue IV heparin 2100 units/hr  Monitor daily heparin level, CBC Monitor for signs/symptoms of bleeding  F/u repeat CT abdomen 7/3   Benetta Spar, PharmD, BCPS, [facility] Clinical Pharmacist  Please check AMION for all [place] phone numbers After 10:00 PM, call [place] [phone]

## 2021-10-04 NOTE — Progress Notes (Signed)
Occupational Therapy Treatment Patient Details Name: Courtney Gray MRN: [id number] DOB: Apr 01, 1971 Today's Date: 10/04/2021   History of present illness 51 y.o. female admitted 09/09/21 with severe LUQ abdominal pain, hypoglycemia. 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   OT comments  Patient received in recliner and asking to return to bed. Patient was transferred back to bed with [facility] with nursing assisting. Patient performed rolling and long sitting in bed to address bed mobility and RUE strengthening with red therapy band. Patient was lethargic during visit but kept asking therapist to do more with her. Continued to work with patient on trunk strengthening and grooming. Patient to continue to be followed by acute OT.    Recommendations for follow up therapy are one component of a multi-disciplinary discharge planning process, led by the attending physician.  Recommendations may be updated based on patient status, additional functional criteria and insurance authorization.    Follow Up Recommendations  Skilled nursing-short term rehab (<3 hours/day)    Assistance Recommended at Discharge Frequent or constant Supervision/Assistance  Patient can return home with the following  Two people to help with walking and/or transfers;Two people to help with bathing/dressing/bathroom;Assistance with cooking/housework;Direct supervision/assist for medications management;Assist for transportation;Help with stairs or ramp for entrance   Equipment Recommendations  Other (comment) (patient has all needed equipment)    Recommendations for Other Services      Precautions / Restrictions Precautions Precautions: Fall;Other (comment) Precaution Comments: H/o R AKA (08/2021), chronic 3L O2 Restrictions Weight Bearing Restrictions: Yes RLE Weight Bearing: Non weight bearing Other  Position/Activity Restrictions: abdominal drain       Mobility Bed Mobility Overal bed mobility: Needs Assistance Bed Mobility: Rolling Rolling: Min assist         General bed mobility comments: rolling in bed to remove pads. Patient able to sit up in bed with use of rail and sit unsupported    Transfers Overall transfer level: Needs assistance Equipment used:  (maxisky) Transfers: Bed to chair/wheelchair/BSC             General transfer comment: transfer from recliner to bed Transfer via Lift Equipment: Maxisky   Balance Overall balance assessment: Needs assistance Sitting-balance support: No upper extremity supported, Feet supported Sitting balance-[name] Scale: Fair Sitting balance - Comments: patient able to long sit in bed unsupported                                   ADL either performed or assessed with clinical judgement   ADL Overall ADL's : Needs assistance/impaired     Grooming: Set up;Sitting;Bed level Grooming Details (indicate cue type and reason): light grooming sitting up in bed                               General ADL Comments: lethargic during visit but wanted to continue with therapy    Extremity/Trunk Assessment              Vision       Perception     Praxis      Cognition Arousal/Alertness: Lethargic Behavior During Therapy: WFL for tasks assessed/performed Overall Cognitive Status: Within Functional Limits for tasks assessed  General Comments: lethargic during visit but wanted to continue therapy        Exercises Exercises: General Upper Extremity General Exercises - Upper Extremity Shoulder Flexion: Strengthening, Right, 10 reps, Theraband Theraband Level (Shoulder Flexion): Level 2 (Red) Shoulder ABduction: Strengthening, Right, 10 reps, Theraband Theraband Level (Shoulder Abduction): Level 2 (Red) Elbow Flexion: Strengthening, Right, 10 reps,  Theraband Theraband Level (Elbow Flexion): Level 2 (Red) Elbow Extension: Strengthening, Right, 10 reps, Theraband Theraband Level (Elbow Extension): Level 2 (Red)    Shoulder Instructions       General Comments      Pertinent Vitals/ Pain       Pain Assessment Pain Assessment: Faces Faces Pain Scale: Hurts little more Pain Location: abdomen Pain Descriptors / Indicators: Discomfort, Grimacing Pain Intervention(s): Monitored during session, Premedicated before session  Home Living                                          Prior Functioning/Environment              Frequency  Min 2X/week        Progress Toward Goals  OT Goals(current goals can now be found in the care plan section)  Progress towards OT goals: Progressing toward goals  Acute Rehab OT Goals Patient Stated Goal: get stronger OT Goal Formulation: With patient Time For Goal Achievement: 09/29/21 Potential to Achieve Goals: Fair ADL Goals Pt Will Perform Lower Body Bathing: with mod assist;with adaptive equipment;sitting/lateral leans Pt Will Perform Lower Body Dressing: with mod assist;with adaptive equipment;sitting/lateral leans Pt Will Transfer to Toilet: with transfer board;with min guard assist;bedside commode Additional ADL Goal #1: Pt will tolerate sitting EOB for 5+ mins with no support to complete ADL tasks. Additional ADL Goal #2: Pt willcomplete bed mobility with min guard, as a precursor to Seated ADL's.  Plan Discharge plan remains appropriate    Co-evaluation                 AM-PAC OT "6 Clicks" Daily Activity     Outcome Measure   Help from another person eating meals?: None Help from another person taking care of personal grooming?: A Little Help from another person toileting, which includes using toliet, bedpan, or urinal?: A Lot Help from another person bathing (including washing, rinsing, drying)?: A Lot Help from another person to put on and taking  off regular upper body clothing?: A Little Help from another person to put on and taking off regular lower body clothing?: Total 6 Click Score: 15    End of Session Equipment Utilized During Treatment: Oxygen;Other (comment) (maxisky)  OT Visit Diagnosis: Other abnormalities of gait and mobility (R26.89);Unsteadiness on feet (R26.81);Muscle weakness (generalized) (M62.81);History of falling (Z91.81);Pain Pain - Right/Left: Right Pain - part of body: Leg   Activity Tolerance Patient limited by lethargy   Patient Left in bed;with call bell/phone within reach   Nurse Communication Mobility status        Time: 5974-1638 OT Time Calculation (min): 45 min  Charges: OT General Charges $OT Visit: 1 Visit OT Treatments $Self Care/Home Management : 8-22 mins $Therapeutic Activity: 8-22 mins $Therapeutic Exercise: 8-22 mins  Lodema Hong, OTA Acute Rehabilitation Services  Office [phone]   Trixie Dredge 10/04/2021, 1:15 PM

## 2021-10-04 NOTE — Progress Notes (Signed)
Hd tx of 4.45hrs completed, 112.5L blood vol processed. Pt tolerated tx well. Hd cath w/out s/s of complications, dressing clean dry intact. Pt transported back to room aox4, no pt acute distress noted. Report given to Courtney calderon, rn. Total uf removed: 2569ml Post hd vs: 159/88(108) 88 18 100% Post hd weight: 151.4kg

## 2021-10-04 NOTE — Progress Notes (Signed)
Courtney Gray Progress Note   Subjective:   Had CXR this AM-results pending. Denies SOB but indicates that breathing is noisy. Denies CP, palpitations, dizziness. Had 2.5L UF with HD yesterday. Says treatment time was too long.   Objective Vitals:   10/04/21 0110 10/04/21 0120 10/04/21 0200 10/04/21 0559  BP: (!) 149/73 (!) 159/88  (!) 147/74  Pulse:   90 90  Resp:  18  (!) 22  Temp: 97.9 F (36.6 C) 97.9 F (36.6 C)  98.4 F (36.9 C)  TempSrc: Oral Oral  Oral  SpO2:   100% 99%  Weight:  (!) 151.4 kg  (!) 152 kg  Height:       Physical Exam General: Obese female, alert and in NAD Heart: RRR, no murmurs, rubs or gallops Lungs: CTA bilaterally but diminished Abdomen: Soft, non-distended, +BS Extremities: LLE trace edema. Necrotic second toe. R AKA Dialysis Access: R [facility] with intact dressing  Additional Objective Labs: Basic Metabolic Panel: Recent Labs  Lab 10/02/21 0621 10/03/21 1623 10/04/21 0726  NA 134* 130* 134*  K 3.8 4.0 3.6  CL 97* 95* 94*  CO2 28 25 29   GLUCOSE 104* 113* 90  BUN 18 27* 13  CREATININE 4.69* 6.24* 3.68*  CALCIUM 7.8* 7.8* 8.1*  PHOS 4.0 5.2* 3.2   Liver Function Tests: Recent Labs  Lab 10/02/21 0621 10/03/21 1623 10/04/21 0726  ALBUMIN 1.9* 1.9* 2.0*   No results for input(s): "LIPASE", "AMYLASE" in the last 168 hours. CBC: Recent Labs  Lab 09/28/21 0933 09/29/21 0504 09/30/21 0500 10/01/21 0706 10/02/21 0621 10/03/21 1624 10/04/21 0726  WBC 33.0*   < > 25.0* 26.9* 25.3* 24.1* 23.4*  NEUTROABS 30.0*  --   --   --   --   --   --   HGB 7.5*   < > 7.1* 7.3* 7.3* 7.5* 7.5*  HCT 25.2*   < > 24.5* 26.2* 26.2* 25.9* 26.6*  MCV 95.1   < > 96.1 98.1 99.2 97.7 96.4  PLT 276   < > 274 323 309 324 317   < > = values in this interval not displayed.   Blood Culture    Component Value Date/Time   SDES ABSCESS 09/29/2021 1106   SPECREQUEST HEPATIC ABSCESS 09/29/2021 1106   CULT  09/29/2021 1106    NO GROWTH 4 DAYS  NO ANAEROBES ISOLATED; CULTURE IN PROGRESS FOR 5 DAYS Performed at [facility], [place] [address], [place], [place] [address]    REPTSTATUS PENDING 09/29/2021 1106    Cardiac Enzymes: No results for input(s): "CKTOTAL", "CKMB", "CKMBINDEX", "TROPONINI" in the last 168 hours. CBG: Recent Labs  Lab 10/02/21 2105 10/03/21 0618 10/03/21 1600 10/03/21 2005 10/04/21 0744  GLUCAP 110* 106* 117* 154* 95   Iron Studies: No results for input(s): "IRON", "TIBC", "TRANSFERRIN", "FERRITIN" in the last 72 hours. @lablastinr3 @ Studies/Results: No results found. Medications:  sodium chloride 10 mL/hr at 10/03/21 2200   ceFEPime (MAXIPIME) IV 2 g (10/03/21 1619)   heparin 2,100 Units/hr (10/04/21 0802)   metronidazole 500 mg (10/03/21 0959)    amLODipine  10 mg Oral Daily   aspirin EC  81 mg Oral Daily   atorvastatin  10 mg Oral QHS   calcitRIOL  1 mcg Oral Q T,Th,Sa-HD   calcium acetate  667 mg Oral TID WC   cinacalcet  30 mg Oral Q T,Th,Sa-HD   [START ON 10/05/2021] darbepoetin (ARANESP) injection - DIALYSIS  200 mcg Intravenous Q Sat-HD  diclofenac Sodium  2 g Topical QID   feeding supplement  1 Container Oral TID BM   feeding supplement (NEPRO CARB STEADY)  237 mL Oral BID BM   gabapentin  100 mg Oral Q12H   gabapentin  200 mg Oral Q T,Th,Sa-HD   Gerhardt's butt cream   Topical BID   heparin sodium (porcine)       insulin aspart  1-3 Units Subcutaneous TID WC   megestrol  40 mg Oral BID   metoCLOPramide (REGLAN) injection  5 mg Intravenous Q6H   multivitamin  1 tablet Oral QHS   pantoprazole  40 mg Oral BID   polyethylene glycol  17 g Oral Daily   sodium chloride flush  10-40 mL Intracatheter Q12H   sodium chloride flush  3 mL Intravenous Q12H   sodium chloride flush  5 mL Intracatheter Q8H    Dialysis Orders: [name] TTS 4h 3min, BFR 400, EDW 151 kg, 1K/2.5Ca bath, TDC - 4/29 > HBsAg neg w/ Abs < 10 Hep 2,600 load and 1,500 hourly-Total dose 9275 units per  treatment.  - mircera 200ug q2 due 09/10/21 - calcitriol 1 ug tiw - sensipar 30 mg tiw  Assessment/Plan: Septic shock: On admit requiring short-term pressors and again after TEE.  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 Splenic Infarct/abdominal pain: anticoagulation per primary team  Splenic/Hepatic abscesses: S/P drain placed in IR 09/29/2021. Per primary ESRD: Continue HD per usual TTS schedule. Next HD 10/05/2021. On heparin gtt-will hold heparin bolus with HD.  Chronic hypoxic resp failure - note that she is on 3 liters at home HTN/volume:  Optimize UF with HD. She has now reached her old EDW and may need to lower further. CXR repeated this AM, results pending.  Anemia of ESRD: HGB low. Continue Aranesp 200 mcg q Thursday. Transfused 1 unit of PRBCs with HD 09/26/2021.    She has acute illness and compounding this is on her menstrual cycle  Secondary Hyperparathyroidism: CorrCa/Phos ok, on Tums as binder. Continue VDRA + sensipar.   T2DM Protein Calorie malnutrition in setting of Morbid Obesity - Albumin very low. Add protein supplements.   Anice Paganini, PA-C 10/04/2021, 9:22 AM  [facility] Pager: [phone]

## 2021-10-05 DIAGNOSIS — R778 Other specified abnormalities of plasma proteins: Secondary | ICD-10-CM | POA: Diagnosis not present

## 2021-10-05 DIAGNOSIS — J9611 Chronic respiratory failure with hypoxia: Secondary | ICD-10-CM | POA: Diagnosis not present

## 2021-10-05 DIAGNOSIS — D735 Infarction of spleen: Secondary | ICD-10-CM | POA: Diagnosis not present

## 2021-10-05 DIAGNOSIS — I96 Gangrene, not elsewhere classified: Secondary | ICD-10-CM | POA: Diagnosis not present

## 2021-10-05 LAB — CBC
HCT: 25.1 % — ABNORMAL LOW (ref 36.0–46.0)
Hemoglobin: 7.4 g/dL — ABNORMAL LOW (ref 12.0–15.0)
MCH: 28.8 pg (ref 26.0–34.0)
MCHC: 29.5 g/dL — ABNORMAL LOW (ref 30.0–36.0)
MCV: 97.7 fL (ref 80.0–100.0)
Platelets: 292 10*3/uL (ref 150–400)
RBC: 2.57 MIL/uL — ABNORMAL LOW (ref 3.87–5.11)
RDW: 20.7 % — ABNORMAL HIGH (ref 11.5–15.5)
WBC: 22 10*3/uL — ABNORMAL HIGH (ref 4.0–10.5)
nRBC: 0.2 % (ref 0.0–0.2)

## 2021-10-05 LAB — TYPE AND SCREEN
ABO/RH(D): O POS
Antibody Screen: NEGATIVE
Unit division: 0

## 2021-10-05 LAB — RENAL FUNCTION PANEL
Albumin: 1.9 g/dL — ABNORMAL LOW (ref 3.5–5.0)
Anion gap: 10 (ref 5–15)
BUN: 18 mg/dL (ref 6–20)
CO2: 27 mmol/L (ref 22–32)
Calcium: 7.8 mg/dL — ABNORMAL LOW (ref 8.9–10.3)
Chloride: 94 mmol/L — ABNORMAL LOW (ref 98–111)
Creatinine, Ser: 4.63 mg/dL — ABNORMAL HIGH (ref 0.44–1.00)
GFR, Estimated: 11 mL/min — ABNORMAL LOW (ref >60)
Glucose, Bld: 81 mg/dL (ref 70–99)
Phosphorus: 4.1 mg/dL (ref 2.5–4.6)
Potassium: 3.7 mmol/L (ref 3.5–5.1)
Sodium: 131 mmol/L — ABNORMAL LOW (ref 135–145)

## 2021-10-05 LAB — GLUCOSE, CAPILLARY
Glucose-Capillary: 84 mg/dL (ref 70–99)
Glucose-Capillary: 87 mg/dL (ref 70–99)
Glucose-Capillary: 123 mg/dL — ABNORMAL HIGH (ref 70–99)
Glucose-Capillary: 104 mg/dL — ABNORMAL HIGH (ref 70–99)

## 2021-10-05 LAB — HEPARIN LEVEL (UNFRACTIONATED): Heparin Unfractionated: 0.32 IU/mL (ref 0.30–0.70)

## 2021-10-05 LAB — BPAM RBC
Blood Product Expiration Date: 202307212359
ISSUE DATE / TIME: 202306271407
Unit Type and Rh: 5100

## 2021-10-05 MED ORDER — ALTEPLASE 2 MG IJ SOLR: 2.0000 mg | Freq: Once | INTRAMUSCULAR | Status: DC | PRN

## 2021-10-05 MED ORDER — HEPARIN SODIUM (PORCINE) 1000 UNIT/ML IJ SOLN
3800.0000 [IU] | Freq: Once | INTRAMUSCULAR | Status: AC
  Administered 2021-10-05: 3800 [IU] via INTRAVENOUS

## 2021-10-05 MED ORDER — HEPARIN SODIUM (PORCINE) 1000 UNIT/ML IJ SOLN
INTRAMUSCULAR | Status: AC
  Filled 2021-10-05: qty 4

## 2021-10-05 MED ORDER — LIDOCAINE-PRILOCAINE 2.5-2.5 % EX CREA: 1.0000 | TOPICAL_CREAM | CUTANEOUS | Status: DC | PRN

## 2021-10-05 MED ORDER — LIDOCAINE HCL (PF) 1 % IJ SOLN: 5.0000 mL | INTRAMUSCULAR | Status: DC | PRN

## 2021-10-05 MED ORDER — PENTAFLUOROPROP-TETRAFLUOROETH EX AERO: 1.0000 | INHALATION_SPRAY | CUTANEOUS | Status: DC | PRN

## 2021-10-05 MED ORDER — ANTICOAGULANT SODIUM CITRATE 4% (200MG/5ML) IV SOLN: 5.0000 mL | Status: DC | PRN

## 2021-10-05 MED ORDER — CARVEDILOL 3.125 MG PO TABS
3.1250 mg | ORAL_TABLET | Freq: Two times a day (BID) | ORAL | Status: DC
  Administered 2021-10-09 – 2021-10-11 (×3): 3.125 mg via ORAL
  Filled 2021-10-05 (×12): qty 1

## 2021-10-05 NOTE — Progress Notes (Signed)
[place] for heparin Indication:  splenic infarct  Heparin Dosing Weight: 95 kg  Labs: Recent Labs    10/03/21 0735 10/03/21 1623 10/03/21 1624 10/03/21 1757 10/04/21 0726 10/05/21 0320 10/05/21 0339  HGB   < >  --  7.5*  --  7.5* 7.4*  --   HCT  --   --  25.9*  --  26.6* 25.1*  --   PLT  --   --  324  --  317 292  --   HEPARINUNFRC  --   --   --  0.35 0.43  --  0.32  CREATININE  --  6.24*  --   --  3.68* 4.63*  --    < > = values in this interval not displayed.     Assessment: 57 yof with hx PVD s/p R AKA 5/10 for critical limb ischemia, ESRD on HD TTS, found to have splenic infarct and transitioned from heparin infusion to apixaban on 6/14. Pharmacy consulted to transition back to heparin infusion with patient hypotensive requiring transfer to ICU s/p TEE on 6/23. Last dose of apixaban 6/23 at 1048. She is s/p IR drain placement for hepatic abscess and 6/23 noted CT finding of possible subcapsular hematoma so heparin goal was lowered.  Heparin level 0.32  remains therapeutic on 2100 units/hr. H/H low stable in the 7s. Note patient is a hard stick.   Goal of Therapy:  Heparin level 0.3-0.5 units/ml Monitor platelets by anticoagulation protocol: Yes   Plan:  Continue IV heparin 2100 units/hr  Monitor daily heparin level, CBC Monitor for signs/symptoms of bleeding  F/u repeat CT abdomen 7/3  Sherlon Handing, PharmD, BCPS Please see amion for complete clinical pharmacist phone list 10/05/2021 8:25 AM

## 2021-10-05 NOTE — Progress Notes (Signed)
[facility]                                                                              Courtney Gray, is a 51 y.o. female, DOB - [birth year], SFK:[id number] Admit date - 09/09/2021    Outpatient Primary MD for the patient is Monico Blitz, MD  LOS - 25  days  Chief Complaint  Patient presents with   Nausea   Constipation       Brief summary   Courtney Gray is a 51 y.o. 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 She was hypoglycemic, febrile with CT revealing hypoenhancement in the spleen consistent with infarct. Started on IV heparin, empiric antibiotics for septic shock of unclear origin. Later taken for left middle finger amputation 6/19, Ultimately was taken for TEE 6/23 which revealed PFO without vegetation and required transfer to ICU for septic shock found to have hepatic and splenic abscesses. ID has guided IV antibiotics, IR placed hepatic abscess drain 6/25.    Assessment & Plan   Septic shock Bacteremia bacteroids thetaiotaomicron-beta-lactamase positive on 6/17: -Drain management per IR -Leukocytosis persistent but trending down, no fevers. -Continue IV cefepime with dialysis and oral metronidazole, ID following  Hepatic and splenic abscess -Status post CT-guided drainage of liver abscess by IR on 6/25 with purulent fluid -IR following, continue 3 times daily flushes -Will need repeat imaging/possible drain injection once output less than 10 mL/daily and consideration of drain removal if output is less than 10 mL/daily -No role for acute surgical intervention at this time per surgery - will repeat CT abdomen with contrast on Monday 7/3  Splenic infarct -No embolic source noted on echo or CT. -TEE on 6/23 showed normal LV function,  negative for endocarditis.  Calcified mass in the right atrium-etiology unclear, possibly calcified thrombus. -eliquis was placed on hold due to subcapsular hematoma 5.9x10x8.9 -Discussed with ID, will repeat CT abd on Monday.  If splenic process larger or symptomatic with worsening leukocytosis, will request IR/surgery evaluation.   Diabetes mellitus type 2, insulin-dependent, uncontrolled with hypoglycemia -Hemoglobin A1c 5.7% Recent Labs    10/03/21 2005 10/04/21 0744 10/04/21 1128 10/04/21 1636 10/04/21 2125 10/05/21 0515  GLUCAP 154* 95 93 133* 105* 84  CBG stable, continue current regimen  Hypotension, likely sedation related -BP now stable and trending up.  Off vasopressors  Left third digit ischemic gangrene Severe PAD with prior AKA -Seen by orthopedics and vascular surgery, amputation of left third digit on 6/19 -Continue pain control, aspirin, statin   Left second toe cyanosis -Cannot rule out early necrosis, poor vasculature -Orthopedic surgery evaluated  Acute on chronic respiratory failure with hypoxia Suspected OHS, OSA -Continue O2, 2 to 3 L via [place], at baseline   ESRD on hemodialysis, TTS -HD per nephrology   Anemia, multifactorial -Status post 2 unit packed RBCs 5/23, 6/22 with HD -Continue Aranesp.   -H&H stable  Menorrhagia -Declined pelvic ultrasound,  patient was placed on Megace.  No further menorrhagia  - transfuse for hemoglobin less than 7  Demand ischemia with elevated troponin in the setting of anemia, sepsis -ACS ruled out.  Currently no chest pain.  Essential hypertension -Initially was hypotensive, requiring pressors, midodrine -Now BP trending up, continue amlodipine, add low-dose Coreg    Hyperlipidemia  -Continue statin   GERD  -continue PPI  Acute on chronic ambulatory dysfunction -Continue PT OT, recommended SNF    Pressure ulcer stage II left and right buttock POA -Continue local wound care.  Change position every 2  hours  Morbid obesity Estimated body mass index is 55.54 kg/m as calculated from the following:   Height as of this encounter: 5\' 5"  (1.651 m).   Weight as of this encounter: 151.4 kg.  Code Status: Full code DVT Prophylaxis:    Level of Care: Level of care: Med-Surg Family Communication: Updated patient   Disposition Plan:      Remains inpatient appropriate: On IV antibiotics, difficult placement.  Transfer to [facility].   Procedures:  HD  Consultants:   Nephrology Infectious disease Interventional radiology General surgery  Antimicrobials:   Anti-infectives (From admission, onward)    Start     Dose/Rate Route Frequency Ordered Stop   09/25/21 0915  metroNIDAZOLE (FLAGYL) IVPB 500 mg        500 mg 100 mL/hr over 60 Minutes Intravenous Every 12 hours 09/25/21 0822     09/13/21 1000  vancomycin (VANCOCIN) IVPB 1000 mg/200 mL premix        1,000 mg 200 mL/hr over 60 Minutes Intravenous STAT 09/13/21 0904 09/13/21 1140   09/10/21 1200  vancomycin (VANCOCIN) IVPB 1000 mg/200 mL premix  Status:  Discontinued        1,000 mg 200 mL/hr over 60 Minutes Intravenous Every T-Th-Sa (Hemodialysis) 09/10/21 0620 09/17/21 1013   09/10/21 1200  ceFEPIme (MAXIPIME) 2 g in sodium chloride 0.9 % 100 mL IVPB        2 g 200 mL/hr over 30 Minutes Intravenous Every T-Th-Sa (Hemodialysis) 09/10/21 1107     09/10/21 0445  vancomycin (VANCOREADY) IVPB 2000 mg/400 mL        2,000 mg 200 mL/hr over 120 Minutes Intravenous  Once 09/10/21 0435 09/10/21 0800   09/10/21 0430  ceFEPIme (MAXIPIME) 2 g in sodium chloride 0.9 % 100 mL IVPB        2 g 200 mL/hr over 30 Minutes Intravenous  Once 09/10/21 0420 09/10/21 0550          Medications  amLODipine  10 mg Oral Daily   aspirin EC  81 mg Oral Daily   atorvastatin  10 mg Oral QHS   calcitRIOL  1 mcg Oral Q T,Th,Sa-HD   calcium acetate  667 mg Oral TID WC   cinacalcet  30 mg Oral Q T,Th,Sa-HD   darbepoetin (ARANESP) injection - DIALYSIS   200 mcg Intravenous Q Sat-HD   diclofenac Sodium  2 g Topical QID   feeding supplement  1 Container Oral TID BM   feeding supplement (NEPRO CARB STEADY)  237 mL Oral BID BM   gabapentin  100 mg Oral Q12H   gabapentin  200 mg Oral Q T,Th,Sa-HD   Gerhardt's butt cream   Topical BID   heparin sodium (porcine)       insulin aspart  1-3 Units Subcutaneous TID WC   megestrol  40 mg Oral BID   metoCLOPramide (REGLAN) injection  5 mg Intravenous Q6H  multivitamin  1 tablet Oral QHS   pantoprazole  40 mg Oral BID   polyethylene glycol  17 g Oral Daily   sodium chloride flush  10-40 mL Intracatheter Q12H   sodium chloride flush  3 mL Intravenous Q12H   sodium chloride flush  5 mL Intracatheter Q8H      Subjective:   Courtney Gray was seen and examined today in HD, no acute complaints. Objective:   Vitals:   10/05/21 1030 10/05/21 1104 10/05/21 1130 10/05/21 1200  BP: (!) 143/76 (!) 142/62 136/79 (!) 144/85  Pulse: 82 (!) 110 84 88  Resp: 13 13 15 16   Temp:    98.2 F (36.8 C)  TempSrc:    Oral  SpO2: 100% (!) 87%  100%  Weight:    (!) 151.4 kg  Height:        Intake/Output Summary (Last 24 hours) at 10/05/2021 1253 Last data filed at 10/05/2021 1200 Gross per 24 hour  Intake 595.95 ml  Output 17.9 ml  Net 578.05 ml     Wt Readings from Last 3 Encounters:  10/05/21 (!) 151.4 kg  09/03/21 (!) 150.6 kg  08/28/21 (!) 150.1 kg   Physical Exam General: Alert and oriented x 3, NAD Cardiovascular: S1 S2 clear, RRR.  Respiratory: CTAB, no wheezing, rales or rhonchi Gastrointestinal: Soft, NBS, abdominal drain+ Ext: no pedal edema bilaterally Psych: Normal affect and demeanor, alert and oriented x3     Data Reviewed:  I have personally reviewed following labs    CBC Lab Results  Component Value Date   WBC 22.0 (H) 10/05/2021   RBC 2.57 (L) 10/05/2021   HGB 7.4 (L) 10/05/2021   HCT 25.1 (L) 10/05/2021   MCV 97.7 10/05/2021   MCH 28.8 10/05/2021   PLT 292  10/05/2021   MCHC 29.5 (L) 10/05/2021   RDW 20.7 (H) 10/05/2021   LYMPHSABS 1.0 09/28/2021   MONOABS 1.3 (H) 09/28/2021   EOSABS 0.0 09/28/2021   BASOSABS 0.0 03/70/4888     Last metabolic panel Lab Results  Component Value Date   NA 131 (L) 10/05/2021   K 3.7 10/05/2021   CL 94 (L) 10/05/2021   CO2 27 10/05/2021   BUN 18 10/05/2021   CREATININE 4.63 (H) 10/05/2021   GLUCOSE 81 10/05/2021   GFRNONAA 11 (L) 10/05/2021   GFRAA 6 (L) 11/29/2019   CALCIUM 7.8 (L) 10/05/2021   PHOS 4.1 10/05/2021   PROT 6.0 (L) 09/14/2021   ALBUMIN 1.9 (L) 10/05/2021   BILITOT 0.7 09/14/2021   ALKPHOS 124 09/14/2021   AST 16 09/14/2021   ALT 10 09/14/2021   ANIONGAP 10 10/05/2021    CBG (last 3)  Recent Labs    10/04/21 1636 10/04/21 2125 10/05/21 0515  GLUCAP 133* 105* 84       Courtney Gray M.D. [facility] 10/05/2021, 12:53 PM  Available via Epic secure chat 7am-7pm After 7 pm, please refer to night coverage provider listed on amion.

## 2021-10-05 NOTE — Progress Notes (Signed)
[facility] Progress Note   Subjective:   Pt seen on HD. CXR yesterday still showed pulmonary vascular congestion. Also complains of pain from pressure ulcers, but recently took pain meds so controlled at present. She denies any SOB. Requested shorter HD time, running 4 hours today.   Objective Vitals:   10/05/21 0734 10/05/21 0745 10/05/21 0747 10/05/21 0811  BP:   135/69 (!) 146/74  Pulse: 84  81   Resp:   14   Temp:   98.6 F (37 C)   TempSrc:   Oral   SpO2: 100%  95%   Weight:  (!) 153.5 kg    Height:       Physical Exam General: Obese female, alert and in NAD Heart: RRR, no murmurs, rubs or gallops Lungs: CTA bilaterally but diminished Abdomen: Soft, non-distended, +BS Extremities: LLE trace edema.  R AKA Dialysis Access: R IJ [facility] with intact dressing  Additional Objective Labs: Basic Metabolic Panel: Recent Labs  Lab 10/03/21 1623 10/04/21 0726 10/05/21 0320  NA 130* 134* 131*  K 4.0 3.6 3.7  CL 95* 94* 94*  CO2 25 29 27   GLUCOSE 113* 90 81  BUN 27* 13 18  CREATININE 6.24* 3.68* 4.63*  CALCIUM 7.8* 8.1* 7.8*  PHOS 5.2* 3.2 4.1   Liver Function Tests: Recent Labs  Lab 10/03/21 1623 10/04/21 0726 10/05/21 0320  ALBUMIN 1.9* 2.0* 1.9*   No results for input(s): "LIPASE", "AMYLASE" in the last 168 hours. CBC: Recent Labs  Lab 09/28/21 0933 09/29/21 0504 10/01/21 0706 10/02/21 0621 10/03/21 1624 10/04/21 0726 10/05/21 0320  WBC 33.0*   < > 26.9* 25.3* 24.1* 23.4* 22.0*  NEUTROABS 30.0*  --   --   --   --   --   --   HGB 7.5*   < > 7.3* 7.3* 7.5* 7.5* 7.4*  HCT 25.2*   < > 26.2* 26.2* 25.9* 26.6* 25.1*  MCV 95.1   < > 98.1 99.2 97.7 96.4 97.7  PLT 276   < > 323 309 324 317 292   < > = values in this interval not displayed.   Blood Culture    Component Value Date/Time   SDES ABSCESS 09/29/2021 1106   SPECREQUEST HEPATIC ABSCESS 09/29/2021 1106   CULT  09/29/2021 1106    No growth aerobically or anaerobically. Performed at  [facility], [place] [address], [place], [place] [address]    REPTSTATUS 10/04/2021 FINAL 09/29/2021 1106    Cardiac Enzymes: No results for input(s): "CKTOTAL", "CKMB", "CKMBINDEX", "TROPONINI" in the last 168 hours. CBG: Recent Labs  Lab 10/04/21 0744 10/04/21 1128 10/04/21 1636 10/04/21 2125 10/05/21 0515  GLUCAP 95 93 133* 105* 84   Iron Studies: No results for input(s): "IRON", "TIBC", "TRANSFERRIN", "FERRITIN" in the last 72 hours. @lablastinr3 @ Studies/Results: DG CHEST PORT 1 VIEW  Result Date: 10/04/2021 CLINICAL DATA:  Pulmonary edema.  ESRD. EXAM: PORTABLE CHEST 1 VIEW COMPARISON:  September 15, 2021. FINDINGS: Moderate left pleural effusion. Overlying left basilar opacities. No overt pulmonary edema. No visible pneumothorax. Enlarged cardiac silhouette. Pulmonary vascular congestion. Similar position of right and left IJ central venous catheters. IMPRESSION: 1. Moderate left pleural effusion with overlying left basilar atelectasis and/or consolidation. 2. Cardiomegaly and pulmonary vascular congestion. No overt pulmonary edema. Electronically Signed   By: Margaretha Sheffield M.D.   On: 10/04/2021 09:20   Medications:  sodium chloride 10 mL/hr at 10/05/21 0000   anticoagulant sodium citrate     ceFEPime (MAXIPIME)  IV 2 g (10/03/21 1619)   heparin 2,100 Units/hr (10/05/21 1191)   metronidazole Stopped (10/04/21 2249)    amLODipine  10 mg Oral Daily   aspirin EC  81 mg Oral Daily   atorvastatin  10 mg Oral QHS   calcitRIOL  1 mcg Oral Q T,Th,Sa-HD   calcium acetate  667 mg Oral TID WC   cinacalcet  30 mg Oral Q T,Th,Sa-HD   darbepoetin (ARANESP) injection - DIALYSIS  200 mcg Intravenous Q Sat-HD   diclofenac Sodium  2 g Topical QID   feeding supplement  1 Container Oral TID BM   feeding supplement (NEPRO CARB STEADY)  237 mL Oral BID BM   gabapentin  100 mg Oral Q12H   gabapentin  200 mg Oral Q T,Th,Sa-HD   Gerhardt's butt cream   Topical BID   insulin aspart  1-3  Units Subcutaneous TID WC   megestrol  40 mg Oral BID   metoCLOPramide (REGLAN) injection  5 mg Intravenous Q6H   multivitamin  1 tablet Oral QHS   pantoprazole  40 mg Oral BID   polyethylene glycol  17 g Oral Daily   sodium chloride flush  10-40 mL Intracatheter Q12H   sodium chloride flush  3 mL Intravenous Q12H   sodium chloride flush  5 mL Intracatheter Q8H    Dialysis Orders: [name] TTS 4h 43min, BFR 400, EDW 151 kg, 1K/2.5Ca bath, TDC - 4/29 > HBsAg neg w/ Abs < 10 Hep 2,600 load and 1,500 hourly-Total dose 9275 units per treatment.  - mircera 200ug q2 due 09/10/21 - calcitriol 1 ug tiw - sensipar 30 mg tiw  Assessment/Plan: Septic shock: On admit requiring short-term pressors and again after TEE.  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 Splenic Infarct/abdominal pain: anticoagulation per primary team  Splenic/Hepatic abscesses: S/P drain placed in IR 09/29/2021. Per primary ESRD: Continue HD per usual TTS schedule. Next HD 10/05/2021. On heparin gtt-will hold heparin bolus with HD.  Chronic hypoxic resp failure - note that she is on 3 liters at home HTN/volume:  Optimize UF with HD. She has now reached her old EDW and may need to lower further. CXR repeated yesterday still shows vascular congestion.  Anemia of ESRD: HGB 7.4. Continue Aranesp 200 mcg q Thursday. Transfused 1 unit of PRBCs with HD 09/26/2021.     Secondary Hyperparathyroidism: CorrCa/Phos ok, on Tums as binder. Continue VDRA + sensipar.   T2DM- per primary team Protein Calorie malnutrition in setting of Morbid Obesity - Albumin  very low. Add protein supplements.   Anice Paganini, PA-C 10/05/2021, 8:24 AM  [facility] Pager: [phone]

## 2021-10-06 DIAGNOSIS — D735 Infarction of spleen: Secondary | ICD-10-CM | POA: Diagnosis not present

## 2021-10-06 DIAGNOSIS — I96 Gangrene, not elsewhere classified: Secondary | ICD-10-CM | POA: Diagnosis not present

## 2021-10-06 DIAGNOSIS — J9611 Chronic respiratory failure with hypoxia: Secondary | ICD-10-CM | POA: Diagnosis not present

## 2021-10-06 LAB — RENAL FUNCTION PANEL
Albumin: 2 g/dL — ABNORMAL LOW (ref 3.5–5.0)
Anion gap: 8 (ref 5–15)
BUN: 12 mg/dL (ref 6–20)
CO2: 27 mmol/L (ref 22–32)
Calcium: 7.8 mg/dL — ABNORMAL LOW (ref 8.9–10.3)
Chloride: 97 mmol/L — ABNORMAL LOW (ref 98–111)
Creatinine, Ser: 3.66 mg/dL — ABNORMAL HIGH (ref 0.44–1.00)
GFR, Estimated: 14 mL/min — ABNORMAL LOW (ref >60)
Glucose, Bld: 111 mg/dL — ABNORMAL HIGH (ref 70–99)
Phosphorus: 3 mg/dL (ref 2.5–4.6)
Potassium: 3.8 mmol/L (ref 3.5–5.1)
Sodium: 132 mmol/L — ABNORMAL LOW (ref 135–145)

## 2021-10-06 LAB — CBC
HCT: 25.7 % — ABNORMAL LOW (ref 36.0–46.0)
Hemoglobin: 7.2 g/dL — ABNORMAL LOW (ref 12.0–15.0)
MCH: 27.8 pg (ref 26.0–34.0)
MCHC: 28 g/dL — ABNORMAL LOW (ref 30.0–36.0)
MCV: 99.2 fL (ref 80.0–100.0)
Platelets: 310 10*3/uL (ref 150–400)
RBC: 2.59 MIL/uL — ABNORMAL LOW (ref 3.87–5.11)
RDW: 20.7 % — ABNORMAL HIGH (ref 11.5–15.5)
WBC: 21.4 10*3/uL — ABNORMAL HIGH (ref 4.0–10.5)
nRBC: 0.2 % (ref 0.0–0.2)

## 2021-10-06 LAB — GLUCOSE, CAPILLARY: Glucose-Capillary: 223 mg/dL — ABNORMAL HIGH (ref 70–99)

## 2021-10-06 LAB — HEPARIN LEVEL (UNFRACTIONATED): Heparin Unfractionated: 0.32 IU/mL (ref 0.30–0.70)

## 2021-10-06 NOTE — Progress Notes (Signed)
[facility]                                                                              Courtney Gray, is a 51 y.o. female, DOB - 30-Apr-1970, ZYS:[id number] Admit date - 09/09/2021    Outpatient Primary MD for the patient is Monico Blitz, MD  LOS - 26  days  Chief Complaint  Patient presents with   Nausea   Constipation       Brief summary   Courtney Gray is a 51 y.o. 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 She was hypoglycemic, febrile with CT revealing hypoenhancement in the spleen consistent with infarct. Started on IV heparin, empiric antibiotics for septic shock of unclear origin. Later taken for left middle finger amputation 6/19, Ultimately was taken for TEE 6/23 which revealed PFO without vegetation and required transfer to ICU for septic shock found to have hepatic and splenic abscesses. ID has guided IV antibiotics, IR placed hepatic abscess drain 6/25.    Assessment & Plan   Septic shock Bacteremia bacteroids thetaiotaomicron-beta-lactamase positive on 6/17: -Drain management per IR -Leukocytosis persistent but trending down, no fevers. -Continue IV cefepime with dialysis and oral metronidazole, ID following  Hepatic and splenic abscess -Status post CT-guided drainage of liver abscess by IR on 6/25 with purulent fluid -IR following, continue 3 times daily flushes -Will need repeat imaging/possible drain injection once output less than 10 mL/daily and consideration of drain removal if output is less than 10 mL/daily -No role for acute surgical intervention at this time per surgery - will repeat CT abdomen with contrast on Monday 7/3  Splenic infarct -No embolic source noted on echo or CT. -TEE on 6/23 showed normal LV function,  negative for endocarditis.  Calcified mass in the right atrium-etiology unclear, possibly calcified thrombus. -eliquis was placed on hold due to subcapsular hematoma 5.9x10x8.9 -Discussed with ID, will repeat CT abd on Monday.  If splenic process larger or symptomatic with worsening leukocytosis, will request IR/surgery evaluation.    Left third digit ischemic gangrene Severe PAD with prior AKA -Seen by orthopedics and vascular surgery, amputation of left third digit on 6/19 -Continue pain control, aspirin, statin -Now developing left second toe gangrene and cyanosis of the left great toe, vascular surgery consulted, discussed with Dr. Stanford Breed, will evaluate   Diabetes mellitus type 2, insulin-dependent, uncontrolled with hypoglycemia -Hemoglobin A1c 5.7% Recent Labs    10/04/21 1636 10/04/21 2125 10/05/21 0515 10/05/21 1323 10/05/21 1733 10/05/21 2127  GLUCAP 133* 105* 84 87 123* 104*  Continue current regimen  Hypotension, likely sedation related -BP now stable  Acute on chronic respiratory failure with hypoxia Suspected OHS, OSA -Continue O2, 2 to 3 L via [place], at baseline   ESRD on hemodialysis, TTS -HD per nephrology   Anemia, multifactorial -Status post 2 unit packed RBCs 5/23, 6/22 with HD -Continue Aranesp.   -Hemoglobin 7.2, follow H&H in a.m., transfuse  if less than 7  Menorrhagia -Resolved, DC Megace   Demand ischemia with elevated troponin in the setting of anemia, sepsis -ACS ruled out.  Currently no chest pain.  Essential hypertension -Initially was hypotensive, requiring pressors, midodrine -Continue amlodipine, low-dose Coreg   Hyperlipidemia  -Continue statin   GERD  -continue PPI  Acute on chronic ambulatory dysfunction -Continue PT OT, recommended SNF    Pressure ulcer stage II left and right buttock POA -Continue local wound care.  Change position every 2 hours  Morbid obesity Estimated body mass index is 55.62 kg/m as calculated from  the following:   Height as of this encounter: 5\' 5"  (1.651 m).   Weight as of this encounter: 151.6 kg.  Code Status: Full code DVT Prophylaxis:    Level of Care: Level of care: Med-Surg Family Communication: Updated patient's daughter at the bedside.  Requested SNF when patient is medically ready   Disposition Plan:      Remains inpatient appropriate: On IV antibiotics, work-up in process Procedures:  HD  Consultants:   Nephrology Infectious disease Interventional radiology General surgery Vascular surgery  Antimicrobials:   Anti-infectives (From admission, onward)    Start     Dose/Rate Route Frequency Ordered Stop   09/25/21 0915  metroNIDAZOLE (FLAGYL) IVPB 500 mg        500 mg 100 mL/hr over 60 Minutes Intravenous Every 12 hours 09/25/21 0822     09/13/21 1000  vancomycin (VANCOCIN) IVPB 1000 mg/200 mL premix        1,000 mg 200 mL/hr over 60 Minutes Intravenous STAT 09/13/21 0904 09/13/21 1140   09/10/21 1200  vancomycin (VANCOCIN) IVPB 1000 mg/200 mL premix  Status:  Discontinued        1,000 mg 200 mL/hr over 60 Minutes Intravenous Every T-Th-Sa (Hemodialysis) 09/10/21 0620 09/17/21 1013   09/10/21 1200  ceFEPIme (MAXIPIME) 2 g in sodium chloride 0.9 % 100 mL IVPB        2 g 200 mL/hr over 30 Minutes Intravenous Every T-Th-Sa (Hemodialysis) 09/10/21 1107     09/10/21 0445  vancomycin (VANCOREADY) IVPB 2000 mg/400 mL        2,000 mg 200 mL/hr over 120 Minutes Intravenous  Once 09/10/21 0435 09/10/21 0800   09/10/21 0430  ceFEPIme (MAXIPIME) 2 g in sodium chloride 0.9 % 100 mL IVPB        2 g 200 mL/hr over 30 Minutes Intravenous  Once 09/10/21 0420 09/10/21 0550          Medications  amLODipine  10 mg Oral Daily   aspirin EC  81 mg Oral Daily   atorvastatin  10 mg Oral QHS   calcitRIOL  1 mcg Oral Q T,Th,Sa-HD   carvedilol  3.125 mg Oral BID WC   cinacalcet  30 mg Oral Q T,Th,Sa-HD   darbepoetin (ARANESP) injection - DIALYSIS  200 mcg Intravenous Q  Sat-HD   diclofenac Sodium  2 g Topical QID   feeding supplement  1 Container Oral TID BM   feeding supplement (NEPRO CARB STEADY)  237 mL Oral BID BM   gabapentin  100 mg Oral Q12H   gabapentin  200 mg Oral Q T,Th,Sa-HD   Gerhardt's butt cream   Topical BID   insulin aspart  1-3 Units Subcutaneous TID WC   metoCLOPramide (REGLAN) injection  5 mg Intravenous Q6H   multivitamin  1 tablet Oral QHS   pantoprazole  40 mg Oral BID   polyethylene glycol  17 g Oral  Daily   sodium chloride flush  10-40 mL Intracatheter Q12H   sodium chloride flush  3 mL Intravenous Q12H   sodium chloride flush  5 mL Intracatheter Q8H      Subjective:   Courtney Gray was seen and examined.  Concerned about left second toe a gangrene and great toe cyanosis and possibly needing amputation.  Daughter at the bedside.  Multiple questions answered.   Objective:   Vitals:   10/06/21 0600 10/06/21 0700 10/06/21 0730 10/06/21 1034  BP:    (!) 141/65  Pulse: 80 77 91 88  Resp:    20  Temp:    98.4 F (36.9 C)  TempSrc:    Oral  SpO2: 100% 100% 90% 100%  Weight:      Height:        Intake/Output Summary (Last 24 hours) at 10/06/2021 1302 Last data filed at 10/06/2021 0700 Gross per 24 hour  Intake 1491.7 ml  Output 10 ml  Net 1481.7 ml     Wt Readings from Last 3 Encounters:  10/06/21 (!) 151.6 kg  09/03/21 (!) 150.6 kg  08/28/21 (!) 150.1 kg   Physical Exam General: Alert and oriented x 3, NAD Cardiovascular: S1 S2 clear, RRR.  Respiratory: CTAB, no wheezing Gastrointestinal: Obese, soft, nontender, NBS, drain+ Ext: right AKA Neuro: no new deficits Skin: Left second toe gangrene, great toe cyanosis Psych: Normal affect and demeanor, alert and oriented x3    Data Reviewed:  I have personally reviewed following labs    CBC Lab Results  Component Value Date   WBC 21.4 (H) 10/06/2021   RBC 2.59 (L) 10/06/2021   HGB 7.2 (L) 10/06/2021   HCT 25.7 (L) 10/06/2021   MCV 99.2 10/06/2021    MCH 27.8 10/06/2021   PLT 310 10/06/2021   MCHC 28.0 (L) 10/06/2021   RDW 20.7 (H) 10/06/2021   LYMPHSABS 1.0 09/28/2021   MONOABS 1.3 (H) 09/28/2021   EOSABS 0.0 09/28/2021   BASOSABS 0.0 76/73/4193     Last metabolic panel Lab Results  Component Value Date   NA 132 (L) 10/06/2021   K 3.8 10/06/2021   CL 97 (L) 10/06/2021   CO2 27 10/06/2021   BUN 12 10/06/2021   CREATININE 3.66 (H) 10/06/2021   GLUCOSE 111 (H) 10/06/2021   GFRNONAA 14 (L) 10/06/2021   GFRAA 6 (L) 11/29/2019   CALCIUM 7.8 (L) 10/06/2021   PHOS 3.0 10/06/2021   PROT 6.0 (L) 09/14/2021   ALBUMIN 2.0 (L) 10/06/2021   BILITOT 0.7 09/14/2021   ALKPHOS 124 09/14/2021   AST 16 09/14/2021   ALT 10 09/14/2021   ANIONGAP 8 10/06/2021    CBG (last 3)  Recent Labs    10/05/21 1323 10/05/21 1733 10/05/21 2127  GLUCAP 87 123* [place]       Keyatta Tolles M.D. [facility] 10/06/2021, 1:02 PM  Available via Epic secure chat 7am-7pm After 7 pm, please refer to night coverage provider listed on amion.

## 2021-10-06 NOTE — Progress Notes (Signed)
[facility] Progress Note   Subjective:   Reports she felt better with dialysis yesterday. Had net UF 2.9L. C/o constipation and asking to hold binders for now, phos is 3.0 so that is ok with me. Denies SOB, CP, dizziness, abdominal pain and nausea.   Objective Vitals:   10/06/21 0500 10/06/21 0600 10/06/21 0700 10/06/21 0730  BP:      Pulse: 80 80 77 91  Resp:      Temp:      TempSrc:      SpO2: 98% 100% 100% 90%  Weight:      Height:       Physical Exam General: Obese female, alert and in NAD Heart: RRR, no murmurs, rubs or gallops Lungs: CTA bilaterally but diminished Abdomen: Soft, non-distended, +BS Extremities: LLE trace edema.  R AKA Dialysis Access: R IJ [facility] with intact dressing  Additional Objective Labs: Basic Metabolic Panel: Recent Labs  Lab 10/04/21 0726 10/05/21 0320 10/06/21 0323  NA 134* 131* 132*  K 3.6 3.7 3.8  CL 94* 94* 97*  CO2 29 27 27   GLUCOSE 90 81 111*  BUN 13 18 12   CREATININE 3.68* 4.63* 3.66*  CALCIUM 8.1* 7.8* 7.8*  PHOS 3.2 4.1 3.0   Liver Function Tests: Recent Labs  Lab 10/04/21 0726 10/05/21 0320 10/06/21 0323  ALBUMIN 2.0* 1.9* 2.0*   No results for input(s): "LIPASE", "AMYLASE" in the last 168 hours. CBC: Recent Labs  Lab 10/02/21 0621 10/03/21 1624 10/04/21 0726 10/05/21 0320 10/06/21 0323  WBC 25.3* 24.1* 23.4* 22.0* 21.4*  HGB 7.3* 7.5* 7.5* 7.4* 7.2*  HCT 26.2* 25.9* 26.6* 25.1* 25.7*  MCV 99.2 97.7 96.4 97.7 99.2  PLT 309 324 317 292 310   Blood Culture    Component Value Date/Time   SDES ABSCESS 09/29/2021 1106   SPECREQUEST HEPATIC ABSCESS 09/29/2021 1106   CULT  09/29/2021 1106    No growth aerobically or anaerobically. Performed at [facility], [place] [address], [place], [place] [address]    REPTSTATUS 10/04/2021 FINAL 09/29/2021 1106    Cardiac Enzymes: No results for input(s): "CKTOTAL", "CKMB", "CKMBINDEX", "TROPONINI" in the last 168 hours. CBG: Recent Labs  Lab  10/04/21 2125 10/05/21 0515 10/05/21 1323 10/05/21 1733 10/05/21 2127  GLUCAP 105* 84 87 123* 104*   Iron Studies: No results for input(s): "IRON", "TIBC", "TRANSFERRIN", "FERRITIN" in the last 72 hours. @lablastinr3 @ Studies/Results: No results found. Medications:  sodium chloride 10 mL/hr at 10/06/21 0700   ceFEPime (MAXIPIME) IV 2 g (10/03/21 1619)   heparin 2,150 Units/hr (10/06/21 0806)   metronidazole Stopped (10/05/21 2205)    amLODipine  10 mg Oral Daily   aspirin EC  81 mg Oral Daily   atorvastatin  10 mg Oral QHS   calcitRIOL  1 mcg Oral Q T,Th,Sa-HD   calcium acetate  667 mg Oral TID WC   carvedilol  3.125 mg Oral BID WC   cinacalcet  30 mg Oral Q T,Th,Sa-HD   darbepoetin (ARANESP) injection - DIALYSIS  200 mcg Intravenous Q Sat-HD   diclofenac Sodium  2 g Topical QID   feeding supplement  1 Container Oral TID BM   feeding supplement (NEPRO CARB STEADY)  237 mL Oral BID BM   gabapentin  100 mg Oral Q12H   gabapentin  200 mg Oral Q T,Th,Sa-HD   Gerhardt's butt cream   Topical BID   insulin aspart  1-3 Units Subcutaneous TID WC   megestrol  40 mg Oral BID  metoCLOPramide (REGLAN) injection  5 mg Intravenous Q6H   multivitamin  1 tablet Oral QHS   pantoprazole  40 mg Oral BID   polyethylene glycol  17 g Oral Daily   sodium chloride flush  10-40 mL Intracatheter Q12H   sodium chloride flush  3 mL Intravenous Q12H   sodium chloride flush  5 mL Intracatheter Q8H    Dialysis Orders: [name] TTS 4h 20min, BFR 400, EDW 151 kg, 1K/2.5Ca bath, TDC - 4/29 > HBsAg neg w/ Abs < 10 Hep 2,600 load and 1,500 hourly-Total dose 9275 units per treatment.  - mircera 200ug q2 due 09/10/21 - calcitriol 1 ug tiw - sensipar 30 mg tiw  Assessment/Plan: Septic shock: On admit requiring short-term pressors and again after TEE.  Found to have bacteremia with bacteroides thetaiotaomicron and liver abscess and concern for splenic abscess  Appreciate ID - for now they do not feel  that we need to remove the tunneled catheter Abx per primary team, Continue cefepime with dialysis and oral metronidazole.  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 Splenic Infarct/abdominal pain: anticoagulation per primary team  Splenic/Hepatic abscesses: S/P drain placed in IR 09/29/2021. Per primary ESRD: Continue HD per usual TTS schedule. Pt has requested 4 hour treatments instead of 4 hr 45min. Advised we can try 4 hour treatments as long as labs are acceptable and we are removing adequate amounts of fluid (actually had more UF on Friday than usual despite shorter treatment time). On heparin gtt-will hold heparin bolus with HD.  Chronic hypoxic resp failure - At baseline, she is on 3 liters at home HTN/volume:  Optimize UF with HD. She has now reached her old EDW but need to lower further. CXR repeated still shows vascular congestion.  Anemia of ESRD: HGB 7.2. Continue Aranesp 200 mcg q Thursday. Transfused 1 unit of PRBCs with HD 09/26/2021.     Secondary Hyperparathyroidism: CorrCa/Phos ok, requesting to hold binder due to constipation but resume if phos trends back up. Continue VDRA + sensipar.   T2DM- per primary team Protein Calorie malnutrition in setting of Morbid Obesity - Albumin very low. Add protein supplements.   Anice Paganini, PA-C 10/06/2021, 9:20 AM  [employer] Pager: [phone]

## 2021-10-06 NOTE — Progress Notes (Signed)
ANTICOAGULATION CONSULT NOTE  Pharmacy Consult for heparin Indication:  splenic infarct  Heparin Dosing Weight: 95 kg  Labs: Recent Labs    10/04/21 0726 10/05/21 0320 10/05/21 0339 10/06/21 0323  HGB 7.5* 7.4*  --  7.2*  HCT 26.6* 25.1*  --  25.7*  PLT 317 292  --  310  HEPARINUNFRC 0.43  --  0.32 0.32  CREATININE 3.68* 4.63*  --  3.66*    Assessment: 12 yof with hx PVD s/p R AKA 5/10 for critical limb ischemia, ESRD on HD TTS, found to have splenic infarct and transitioned from heparin infusion to apixaban on 6/14. Pharmacy consulted to transition back to heparin infusion with patient hypotensive requiring transfer to ICU s/p TEE on 6/23. Last dose of apixaban 6/23 at 1048. She is s/p IR drain placement for hepatic abscess and 6/23 noted CT finding of possible subcapsular hematoma so heparin goal was lowered.  Heparin level 0.32 at low end of therapeutic on 2100 units/hr. H/H low stable in the 7s. Note patient is a hard stick.   Goal of Therapy:  Heparin level 0.3-0.5 units/ml Monitor platelets by anticoagulation protocol: Yes   Plan:  Increase heparin slightly to 2150 units/hr to keep in goal Monitor daily heparin level, CBC Monitor for signs/symptoms of bleeding  F/u repeat CT abdomen 7/3   Benetta Spar, PharmD, BCPS, [facility] Clinical Pharmacist  Please check AMION for all [place] phone numbers After 10:00 PM, call [place] [phone]

## 2021-10-07 ENCOUNTER — Inpatient Hospital Stay (HOSPITAL_COMMUNITY): Payer: 59

## 2021-10-07 DIAGNOSIS — I96 Gangrene, not elsewhere classified: Secondary | ICD-10-CM | POA: Diagnosis not present

## 2021-10-07 DIAGNOSIS — J9611 Chronic respiratory failure with hypoxia: Secondary | ICD-10-CM | POA: Diagnosis not present

## 2021-10-07 DIAGNOSIS — R778 Other specified abnormalities of plasma proteins: Secondary | ICD-10-CM | POA: Diagnosis not present

## 2021-10-07 DIAGNOSIS — D72829 Elevated white blood cell count, unspecified: Secondary | ICD-10-CM | POA: Diagnosis not present

## 2021-10-07 DIAGNOSIS — D735 Infarction of spleen: Secondary | ICD-10-CM | POA: Diagnosis not present

## 2021-10-07 DIAGNOSIS — E1122 Type 2 diabetes mellitus with diabetic chronic kidney disease: Secondary | ICD-10-CM | POA: Diagnosis not present

## 2021-10-07 LAB — CBC
HCT: 24.6 % — ABNORMAL LOW (ref 36.0–46.0)
Hemoglobin: 7.2 g/dL — ABNORMAL LOW (ref 12.0–15.0)
MCH: 28.8 pg (ref 26.0–34.0)
MCHC: 29.3 g/dL — ABNORMAL LOW (ref 30.0–36.0)
MCV: 98.4 fL (ref 80.0–100.0)
Platelets: 298 10*3/uL (ref 150–400)
RBC: 2.5 MIL/uL — ABNORMAL LOW (ref 3.87–5.11)
RDW: 20.6 % — ABNORMAL HIGH (ref 11.5–15.5)
WBC: 19.7 10*3/uL — ABNORMAL HIGH (ref 4.0–10.5)
nRBC: 0.2 % (ref 0.0–0.2)

## 2021-10-07 LAB — HEPARIN LEVEL (UNFRACTIONATED): Heparin Unfractionated: 0.36 IU/mL (ref 0.30–0.70)

## 2021-10-07 LAB — RENAL FUNCTION PANEL
Albumin: 1.8 g/dL — ABNORMAL LOW (ref 3.5–5.0)
Anion gap: 10 (ref 5–15)
BUN: 20 mg/dL (ref 6–20)
CO2: 27 mmol/L (ref 22–32)
Calcium: 8.1 mg/dL — ABNORMAL LOW (ref 8.9–10.3)
Chloride: 95 mmol/L — ABNORMAL LOW (ref 98–111)
Creatinine, Ser: 5.11 mg/dL — ABNORMAL HIGH (ref 0.44–1.00)
GFR, Estimated: 10 mL/min — ABNORMAL LOW (ref >60)
Glucose, Bld: 77 mg/dL (ref 70–99)
Phosphorus: 3.8 mg/dL (ref 2.5–4.6)
Potassium: 3.9 mmol/L (ref 3.5–5.1)
Sodium: 132 mmol/L — ABNORMAL LOW (ref 135–145)

## 2021-10-07 LAB — GLUCOSE, CAPILLARY: Glucose-Capillary: 154 mg/dL — ABNORMAL HIGH (ref 70–99)

## 2021-10-07 MED ORDER — IOHEXOL 300 MG/ML  SOLN
100.0000 mL | Freq: Once | INTRAMUSCULAR | Status: AC | PRN
  Administered 2021-10-07: 100 mL via INTRAVENOUS

## 2021-10-07 MED ORDER — FLEET ENEMA 7-19 GM/118ML RE ENEM
1.0000 | ENEMA | Freq: Once | RECTAL | Status: AC
  Administered 2021-10-08: 1 via RECTAL
  Filled 2021-10-07: qty 1

## 2021-10-07 NOTE — Progress Notes (Signed)
[facility]                                                                              Courtney Gray, is a 51 y.o. female, DOB - 05-14-1970, DGL:[id number] Admit date - 09/09/2021    Outpatient Primary MD for the patient is Monico Blitz, MD  LOS - 27  days  Chief Complaint  Patient presents with   Nausea   Constipation       Brief summary   Courtney Gray is a 51 y.o. 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 She was hypoglycemic, febrile with CT revealing hypoenhancement in the spleen consistent with infarct. Started on IV heparin, empiric antibiotics for septic shock of unclear origin. Later taken for left middle finger amputation 6/19, Ultimately was taken for TEE 6/23 which revealed PFO without vegetation and required transfer to ICU for septic shock found to have hepatic and splenic abscesses. ID has guided IV antibiotics, IR placed hepatic abscess drain 6/25.    Assessment & Plan   Septic shock Bacteremia bacteroids thetaiotaomicron-beta-lactamase positive on 6/17: -Drain management per IR -Continue IV cefepime with dialysis and oral metronidazole -ID following  Hepatic and splenic abscess -Status post CT-guided drainage of liver abscess by IR on 6/25 with purulent fluid -IR following, continue 3 times daily flushes -Will need repeat imaging/possible drain injection once output less than 10 mL/daily and consideration of drain removal if output is less than 10 mL/daily -CT abdomen pelvis repeated today show near completion of previously seen hepatic fluid abscess.  Large bilobed subcapsular fluid collection/hematoma 10.7 cm still persisting.  Small volume perihepatic ascites, small to moderate size loculated left pleural effusion -Continue IV  cefepime, oral metronidazole, follow ID recommendations  Splenic infarct -No embolic source noted on echo or CT. -TEE on 6/23 showed normal LV function, negative for endocarditis.  Calcified mass in the right atrium-etiology unclear, possibly calcified thrombus. -eliquis was placed on hold due to subcapsular hematoma 5.9x10x8.9 -CT abdomen pelvis repeated, still persisting hematoma 10.7 cm, hold off on eliquis  Left third digit ischemic gangrene Severe PAD with prior AKA New left first and second toe gangrene -Seen by orthopedics and vascular surgery, amputation of left third digit on 6/19 -Continue pain control, aspirin, statin -Appreciate vascular surgery reevaluation, recommended left AKA, plan on 10/09/2021   Diabetes mellitus type 2, insulin-dependent, uncontrolled with hypoglycemia -Hemoglobin A1c 5.7% -CBGs fairly controlled, continue current regimen  Hypotension, likely sedation related -BP now stable  Acute on chronic respiratory failure with hypoxia Suspected OHS, OSA -Continue O2, 2 to 3 L via [place], at baseline   ESRD on hemodialysis, TTS -HD per nephrology   Anemia, multifactorial -Status post 2 unit packed RBCs 5/23, 6/22 with HD -Continue Aranesp.   -Hemoglobin 7.2, follow H&H in a.m., transfuse if less than 7  Menorrhagia -Resolved, Megace discontinued  Demand ischemia with elevated troponin in the setting of anemia, sepsis -ACS ruled  out.  Currently no chest pain.  Essential hypertension -Initially was hypotensive, requiring pressors, midodrine -Continue amlodipine, low-dose Coreg   Hyperlipidemia  -Continue statin   GERD  -continue PPI  Acute on chronic ambulatory dysfunction -Continue PT OT, recommended SNF    Pressure ulcer stage II left and right buttock POA -Continue local wound care.  Change position every 2 hours  Morbid obesity Estimated body mass index is 55.62 kg/m as calculated from the following:   Height as of this encounter: 5\' 5"   (1.651 m).   Weight as of this encounter: 151.6 kg.  Code Status: Full code DVT Prophylaxis:    Level of Care: Level of care: Med-Surg Family Communication: Updated patient's daughter at the bedside on 7/22023.  Requested SNF when patient is medically ready   Disposition Plan:      Remains inpatient appropriate: On IV antibiotics, work-up in process Procedures:  HD  Consultants:   Nephrology Infectious disease Interventional radiology General surgery Vascular surgery  Antimicrobials:   Anti-infectives (From admission, onward)    Start     Dose/Rate Route Frequency Ordered Stop   09/25/21 0915  metroNIDAZOLE (FLAGYL) IVPB 500 mg        500 mg 100 mL/hr over 60 Minutes Intravenous Every 12 hours 09/25/21 0822     09/13/21 1000  vancomycin (VANCOCIN) IVPB 1000 mg/200 mL premix        1,000 mg 200 mL/hr over 60 Minutes Intravenous STAT 09/13/21 0904 09/13/21 1140   09/10/21 1200  vancomycin (VANCOCIN) IVPB 1000 mg/200 mL premix  Status:  Discontinued        1,000 mg 200 mL/hr over 60 Minutes Intravenous Every T-Th-Sa (Hemodialysis) 09/10/21 0620 09/17/21 1013   09/10/21 1200  ceFEPIme (MAXIPIME) 2 g in sodium chloride 0.9 % 100 mL IVPB        2 g 200 mL/hr over 30 Minutes Intravenous Every T-Th-Sa (Hemodialysis) 09/10/21 1107     09/10/21 0445  vancomycin (VANCOREADY) IVPB 2000 mg/400 mL        2,000 mg 200 mL/hr over 120 Minutes Intravenous  Once 09/10/21 0435 09/10/21 0800   09/10/21 0430  ceFEPIme (MAXIPIME) 2 g in sodium chloride 0.9 % 100 mL IVPB        2 g 200 mL/hr over 30 Minutes Intravenous  Once 09/10/21 0420 09/10/21 0550          Medications  amLODipine  10 mg Oral Daily   aspirin EC  81 mg Oral Daily   atorvastatin  10 mg Oral QHS   calcitRIOL  1 mcg Oral Q T,Th,Sa-HD   carvedilol  3.125 mg Oral BID WC   cinacalcet  30 mg Oral Q T,Th,Sa-HD   darbepoetin (ARANESP) injection - DIALYSIS  200 mcg Intravenous Q Sat-HD   diclofenac Sodium  2 g Topical QID    feeding supplement  1 Container Oral TID BM   feeding supplement (NEPRO CARB STEADY)  237 mL Oral BID BM   gabapentin  100 mg Oral Q12H   gabapentin  200 mg Oral Q T,Th,Sa-HD   Gerhardt's butt cream   Topical BID   insulin aspart  1-3 Units Subcutaneous TID WC   metoCLOPramide (REGLAN) injection  5 mg Intravenous Q6H   multivitamin  1 tablet Oral QHS   pantoprazole  40 mg Oral BID   polyethylene glycol  17 g Oral Daily   sodium chloride flush  10-40 mL Intracatheter Q12H   sodium chloride flush  3 mL Intravenous Q12H  sodium chloride flush  5 mL Intracatheter Q8H   sodium phosphate  1 enema Rectal Once      Subjective:   Courtney Gray was seen and examined.  No acute complaints this morning.  No fevers or chills, worsening pain, chest pain or shortness of breath.   Objective:   Vitals:   10/06/21 1034 10/06/21 2100 10/07/21 0652 10/07/21 0834  BP: (!) 141/65 (!) 139/54 135/68 (!) 144/66  Pulse: 88 88 87   Resp: 20 20 18    Temp: 98.4 F (36.9 C) 98.6 F (37 C) 98.7 F (37.1 C)   TempSrc: Oral Oral Oral   SpO2: 100% 98% 99%   Weight:      Height:        Intake/Output Summary (Last 24 hours) at 10/07/2021 1514 Last data filed at 10/07/2021 0615 Gross per 24 hour  Intake 432.84 ml  Output 30 ml  Net 402.84 ml     Wt Readings from Last 3 Encounters:  10/06/21 (!) 151.6 kg  09/03/21 (!) 150.6 kg  08/28/21 (!) 150.1 kg    Physical Exam General: Alert and oriented x 3, NAD Cardiovascular: S1 S2 clear, RRR.  Respiratory: Diminished breath sounds Gastrointestinal: Obese, NBS, drain + Ext: right AKA, LLE trace edema Neuro: no new deficits Skin: New  1st, 2nd toe gangrene. Psych: Normal affect and demeanor, alert and oriented x3     Data Reviewed:  I have personally reviewed following labs    CBC Lab Results  Component Value Date   WBC 19.7 (H) 10/07/2021   RBC 2.50 (L) 10/07/2021   HGB 7.2 (L) 10/07/2021   HCT 24.6 (L) 10/07/2021   MCV 98.4  10/07/2021   MCH 28.8 10/07/2021   PLT 298 10/07/2021   MCHC 29.3 (L) 10/07/2021   RDW 20.6 (H) 10/07/2021   LYMPHSABS 1.0 09/28/2021   MONOABS 1.3 (H) 09/28/2021   EOSABS 0.0 09/28/2021   BASOSABS 0.0 50/27/7412     Last metabolic panel Lab Results  Component Value Date   NA 132 (L) 10/07/2021   K 3.9 10/07/2021   CL 95 (L) 10/07/2021   CO2 27 10/07/2021   BUN 20 10/07/2021   CREATININE 5.11 (H) 10/07/2021   GLUCOSE 77 10/07/2021   GFRNONAA 10 (L) 10/07/2021   GFRAA 6 (L) 11/29/2019   CALCIUM 8.1 (L) 10/07/2021   PHOS 3.8 10/07/2021   PROT 6.0 (L) 09/14/2021   ALBUMIN 1.8 (L) 10/07/2021   BILITOT 0.7 09/14/2021   ALKPHOS 124 09/14/2021   AST 16 09/14/2021   ALT 10 09/14/2021   ANIONGAP 10 10/07/2021    CBG (last 3)  Recent Labs    10/05/21 1733 10/05/21 2127 10/06/21 2107  GLUCAP 123* 104* 223*       Yannis Gumbs M.D. [facility] 10/07/2021, 3:14 PM  Available via Epic secure chat 7am-7pm After 7 pm, please refer to night coverage provider listed on amion.

## 2021-10-07 NOTE — TOC Progression Note (Signed)
Transition of Care [facility]) - Progression Note    Patient Details  Name: Courtney Gray MRN: [id number] Date of Birth: Aug 12, 1970  Transition of Care [facility]) CM/SW [place], [place] Phone Number: 10/07/2021, 4:21 PM  Clinical Narrative:     CSW LVM for Tressa Busman with [facility] to check on determination of LTC SNF referral. CSW awaiting callback. CSW will continue to follow and assist with patients dc planning needs.  Expected Discharge Plan: Skilled Nursing Facility Barriers to Discharge: Continued Medical Work up  Expected Discharge Plan and Services Expected Discharge Plan: [place] In-house Referral: Clinical Social Work Discharge Planning Services: CM Consult Post Acute Care Choice: Durable Medical Equipment Living arrangements for the past 2 months: Single Family Home                                       Social Determinants of Health (SDOH) Interventions    Readmission Risk Interventions    09/16/2021    4:36 PM 08/28/2021   11:50 AM 07/12/2021   12:38 PM  Readmission Risk Prevention Plan  Transportation Screening Complete Complete Complete  Medication Review [employer])  Complete Complete  PCP or Specialist appointment within 3-5 days of discharge  Complete Complete  HRI or Home Care Consult Complete Complete Complete  SW Recovery Care/Counseling Consult Complete Complete Complete  Palliative Care Screening  Not Applicable Not Applicable  Skilled Nursing Facility Complete Not Applicable Complete

## 2021-10-07 NOTE — Progress Notes (Signed)
Patient is s/p hepatic fluid collection drain placement on 6/25.  Cx showed no growth.   IR has been following, CT today showed near complete resolution of the hepatic fluid.  Discussed with Dr. Laurence Ferrari, ok to remove the hepatic drain.   Drain was removed  without difficulty, patient tolerated well.   Dressing was placed.  Patient was instructed to: - keep the dressing clean and dry for at least 24 hours - no submerging x 7 days  - look for signs and symptoms of infection such as reddening of skin, pus like drainage, fever and/or chills - dressing changes as needed until fully heals   Patient verbalized understanding.   RN notified that the drain was removed via secure chat.   Please call IR for questions and concerns.   Armando Gang Navika Hoopes PA-C 10/07/2021 5:06 PM

## 2021-10-07 NOTE — Progress Notes (Signed)
[facility] PROGRESS NOTE  ASSESSMENT / PLAN: Courtney Gray is a 51 y.o. 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  Angiogram performed 08/07/2021 shows no options for revascularization of the left lower extremity.  I reviewed her options going forward.  I counseled her that a left above-knee amputation is likely her best option for avoiding further intervention.  I quoted her a 60 to 70% chance of healing a below-knee amputation.  She has no chance of healing a simple toe amputation and keeping her leg.  She is understandably upset, but wants to proceed with left above-knee amputation.  We will plan to do this on Wednesday, 10/09/2021.  SUBJECTIVE: Asked to reevaluate patient for new gangrene in the left foot.  Patient has had a complex and lengthy hospitalization.  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  OBJECTIVE: BP (!) 144/66   Pulse 87   Temp 98.7 F (37.1 C) (Oral)   Resp 18   Ht 5\' 5"  (1.651 m)   Wt (!) 151.6 kg   SpO2 99%   BMI 55.62 kg/m   Intake/Output Summary (Last 24 hours) at 10/07/2021 1225 Last data filed at 10/07/2021 0615 Gross per 24 hour  Intake 552.84 ml  Output 30 ml  Net 522.84 ml    No distress Regular rate and rhythm Unlabored breathing Morbid obesity Left first and second toe gangrenous skin changes Right above-knee amputation     Latest Ref Rng & Units 10/07/2021    5:20 AM 10/06/2021    3:23 AM 10/05/2021    3:20 AM  CBC  WBC 4.0 - 10.5 K/uL 19.7  21.4  22.0   Hemoglobin 12.0 - 15.0 g/dL 7.2  7.2  7.4   Hematocrit 36.0 - 46.0 % 24.6  25.7  25.1   Platelets 150 - 400 K/uL 298  310  292         Latest Ref Rng & Units 10/07/2021    5:20 AM 10/06/2021    3:23 AM 10/05/2021    3:20 AM  CMP  Glucose 70 - 99 mg/dL 77  111  81    BUN 6 - 20 mg/dL 20  12  18    Creatinine 0.44 - 1.00 mg/dL 5.11  3.66  4.63   Sodium 135 - 145 mmol/L 132  132  131   Potassium 3.5 - 5.1 mmol/L 3.9  3.8  3.7   Chloride 98 - 111 mmol/L 95  97  94   CO2 22 - 32 mmol/L 27  27  27    Calcium 8.9 - 10.3 mg/dL 8.1  7.8  7.8     Estimated Creatinine Clearance: 19.5 mL/min (A) (by C-G formula based on SCr of 5.11 mg/dL (H)).  Courtney Gray. Stanford Breed, MD [facility] Phone Number: [phone] 10/07/2021 12:25 PM

## 2021-10-07 NOTE — Progress Notes (Signed)
[place] for Infectious Disease  Date of Admission:  09/09/2021     Principal Problem:   Splenic infarction Active Problems:   Uncontrolled type 2 diabetes mellitus with hypoglycemia, with long-term current use of insulin (HCC)   Essential hypertension   ESRD (end stage renal disease) on dialysis (HCC)   Chronic respiratory failure with hypoxia (HCC)   Leukocytosis   Dry gangrene (HCC)   Pneumonia of left lower lobe due to infectious organism   Lactic acidosis   Mixed diabetic hyperlipidemia associated with type 2 diabetes mellitus (HCC)   Elevated troponin level not due myocardial infarction   Hypotension   PVD (peripheral vascular disease) (HCC)   Hx of AKA (above knee amputation), right (HCC)   Pressure injury of skin   Hepatic abscess          Assessment: 51 YF admitted with: #Hepatic abscess status post drain placement-resolved #Subcapsular fluid collection/splenic infarcts-increase in size #Bacteroides bacteremia-cleared on 6/25 #Persistent leukocytosis #Peripheral gangrene(left second toe) #ESRD on IHD -Patient had drain placed hepatic acid cells, cultures negative. CT on 7/3 showed complete resolution of hepatic collection/abscess.  Large bilobed subcapsular fluid collection in spleen measuring 10.7 cm small-to-moderate loculated left pleural effusion.  Perinephric stranding and fluid associated with both kidneys is increased - Etiology of back leukocytosis unclear although increased in size.  Of subcapsular fluid collection may be contributing(10->10.7cm).  Also has second toe gangrene.  TEE showed no vegetation. Recommendations: -Engage IR as subcapsular collection drainage as it persists in the setting for persistent aleukocytosis.  -Continue cefepime metronidazole   Microbiology:   Antibiotics: Cefepime and metronidazole  Cultures: Blood 6/25 1/2 bacteroides thetaiotaomicron  Other 6/17 hepatic abscess Cx NG 6/25  NG  SUBJECTIVE: Resting in bed. She reports she would like to avoid PO contrast. Interval:wbc 19.7k. afebrile overnight.   Review of Systems: Review of Systems  All other systems reviewed and are negative.    Scheduled Meds:  amLODipine  10 mg Oral Daily   aspirin EC  81 mg Oral Daily   atorvastatin  10 mg Oral QHS   calcitRIOL  1 mcg Oral Q T,Th,Sa-HD   carvedilol  3.125 mg Oral BID WC   cinacalcet  30 mg Oral Q T,Th,Sa-HD   darbepoetin (ARANESP) injection - DIALYSIS  200 mcg Intravenous Q Sat-HD   diclofenac Sodium  2 g Topical QID   feeding supplement  1 Container Oral TID BM   feeding supplement (NEPRO CARB STEADY)  237 mL Oral BID BM   gabapentin  100 mg Oral Q12H   gabapentin  200 mg Oral Q T,Th,Sa-HD   Gerhardt's butt cream   Topical BID   insulin aspart  1-3 Units Subcutaneous TID WC   metoCLOPramide (REGLAN) injection  5 mg Intravenous Q6H   multivitamin  1 tablet Oral QHS   pantoprazole  40 mg Oral BID   polyethylene glycol  17 g Oral Daily   sodium chloride flush  10-40 mL Intracatheter Q12H   sodium chloride flush  3 mL Intravenous Q12H   sodium chloride flush  5 mL Intracatheter Q8H   sodium phosphate  1 enema Rectal Once   Continuous Infusions:  sodium chloride 10 mL/hr at 10/06/21 2000   ceFEPime (MAXIPIME) IV 2 g (10/03/21 1619)   heparin 2,150 Units/hr (10/07/21 0725)   metronidazole 500 mg (10/07/21 0950)   PRN Meds:.acetaminophen **OR** acetaminophen, albuterol, fentaNYL, glucagon (human recombinant), glucose, HYDROmorphone, labetalol, lactulose, lidocaine (PF), lidocaine-prilocaine, midazolam,  nitroGLYCERIN, ondansetron **OR** ondansetron (ZOFRAN) IV, pentafluoroprop-tetrafluoroeth, prochlorperazine, senna-docusate Allergies  Allergen Reactions   Benzonatate Other (See Comments)    Hallucinations   Hydralazine Other (See Comments)    Hallucinations   Amoxicillin Itching and Nausea And Vomiting   Clonidine Other (See Comments)    Altered mental  state, insomnia    Chlorhexidine Itching    Patient reports it is the CHG baths that cause her itching not the CHG used for caring for her IV/HD access.     Morphine Itching   Oxycodone Itching    Pt tolerated hospital admission 07/2021    OBJECTIVE: Vitals:   10/06/21 1034 10/06/21 2100 10/07/21 0652 10/07/21 0834  BP: (!) 141/65 (!) 139/54 135/68 (!) 144/66  Pulse: 88 88 87   Resp: 20 20 18    Temp: 98.4 F (36.9 C) 98.6 F (37 C) 98.7 F (37.1 C)   TempSrc: Oral Oral Oral   SpO2: 100% 98% 99%   Weight:      Height:       Body mass index is 55.62 kg/m.  Physical Exam Constitutional:      Appearance: Normal appearance.  HENT:     Head: Normocephalic and atraumatic.     Right Ear: Tympanic membrane normal.     Left Ear: Tympanic membrane normal.     Nose: Nose normal.     Mouth/Throat:     Mouth: Mucous membranes are moist.  Eyes:     Extraocular Movements: Extraocular movements intact.     Conjunctiva/sclera: Conjunctivae normal.     Pupils: Pupils are equal, round, and reactive to light.  Cardiovascular:     Rate and Rhythm: Normal rate and regular rhythm.     Heart sounds: No murmur heard.    No friction rub. No gallop.  Pulmonary:     Effort: Pulmonary effort is normal.     Breath sounds: Normal breath sounds.  Abdominal:     General: Abdomen is flat.     Palpations: Abdomen is soft.  Musculoskeletal:        General: Normal range of motion.  Skin:    General: Skin is warm and dry.  Neurological:     General: No focal deficit present.     Mental Status: She is alert and oriented to person, place, and time.  Psychiatric:        Mood and Affect: Mood normal.       Lab Results Lab Results  Component Value Date   WBC 19.7 (H) 10/07/2021   HGB 7.2 (L) 10/07/2021   HCT 24.6 (L) 10/07/2021   MCV 98.4 10/07/2021   PLT 298 10/07/2021    Lab Results  Component Value Date   CREATININE 5.11 (H) 10/07/2021   BUN 20 10/07/2021   NA 132 (L) 10/07/2021    K 3.9 10/07/2021   CL 95 (L) 10/07/2021   CO2 27 10/07/2021    Lab Results  Component Value Date   ALT 10 09/14/2021   AST 16 09/14/2021   ALKPHOS 124 09/14/2021   BILITOT 0.7 09/14/2021        Laurice Record, [place] [facility] 10/07/2021, 4:48 PM

## 2021-10-07 NOTE — Progress Notes (Signed)
Pt refused oral contrast. MD notified

## 2021-10-07 NOTE — Progress Notes (Addendum)
[facility] Progress Note   Subjective:  Seen in room - ongoing pain, but no CP/dyspnea. For HD tomorrow.  Objective Vitals:   10/06/21 1034 10/06/21 2100 10/07/21 0652 10/07/21 0834  BP: (!) 141/65 (!) 139/54 135/68 (!) 144/66  Pulse: 88 88 87   Resp: 20 20 18    Temp: 98.4 F (36.9 C) 98.6 F (37 C) 98.7 F (37.1 C)   TempSrc: Oral Oral Oral   SpO2: 100% 98% 99%   Weight:      Height:       Physical Exam General: Obese woman, NAD. Nasal O2 in place Heart: RRR; no murmur Lungs: CTA anteriorly Abdomen: soft, non-tender Extremities: R AKA, LLE with trace edema. L 2nd toe with dry gangrene appearance, L hand bandaged Dialysis Access: R [facility]  Additional Objective Labs: Basic Metabolic Panel: Recent Labs  Lab 10/05/21 0320 10/06/21 0323 10/07/21 0520  NA 131* 132* 132*  K 3.7 3.8 3.9  CL 94* 97* 95*  CO2 27 27 27   GLUCOSE 81 111* 77  BUN 18 12 20   CREATININE 4.63* 3.66* 5.11*  CALCIUM 7.8* 7.8* 8.1*  PHOS 4.1 3.0 3.8   Liver Function Tests: Recent Labs  Lab 10/05/21 0320 10/06/21 0323 10/07/21 0520  ALBUMIN 1.9* 2.0* 1.8*   CBC: Recent Labs  Lab 10/03/21 1624 10/04/21 0726 10/05/21 0320 10/06/21 0323 10/07/21 0520  WBC 24.1* 23.4* 22.0* 21.4* 19.7*  HGB 7.5* 7.5* 7.4* 7.2* 7.2*  HCT 25.9* 26.6* 25.1* 25.7* 24.6*  MCV 97.7 96.4 97.7 99.2 98.4  PLT 324 317 292 310 298   Blood Culture    Component Value Date/Time   SDES ABSCESS 09/29/2021 1106   SPECREQUEST HEPATIC ABSCESS 09/29/2021 1106   CULT  09/29/2021 1106    No growth aerobically or anaerobically. Performed at [facility], [place] [address], [place], [place] [address]    REPTSTATUS 10/04/2021 FINAL 09/29/2021 1106   Medications:  sodium chloride 10 mL/hr at 10/06/21 2000   ceFEPime (MAXIPIME) IV 2 g (10/03/21 1619)   heparin 2,150 Units/hr (10/07/21 0725)   metronidazole 500 mg (10/07/21 0950)    amLODipine  10 mg Oral Daily   aspirin EC  81 mg Oral Daily    atorvastatin  10 mg Oral QHS   calcitRIOL  1 mcg Oral Q T,Th,Sa-HD   carvedilol  3.125 mg Oral BID WC   cinacalcet  30 mg Oral Q T,Th,Sa-HD   darbepoetin (ARANESP) injection - DIALYSIS  200 mcg Intravenous Q Sat-HD   diclofenac Sodium  2 g Topical QID   feeding supplement  1 Container Oral TID BM   feeding supplement (NEPRO CARB STEADY)  237 mL Oral BID BM   gabapentin  100 mg Oral Q12H   gabapentin  200 mg Oral Q T,Th,Sa-HD   Gerhardt's butt cream   Topical BID   insulin aspart  1-3 Units Subcutaneous TID WC   metoCLOPramide (REGLAN) injection  5 mg Intravenous Q6H   multivitamin  1 tablet Oral QHS   pantoprazole  40 mg Oral BID   polyethylene glycol  17 g Oral Daily   sodium chloride flush  10-40 mL Intracatheter Q12H   sodium chloride flush  3 mL Intravenous Q12H   sodium chloride flush  5 mL Intracatheter Q8H   sodium phosphate  1 enema Rectal Once    Dialysis Orders: [name] TTS 4h 38min, BFR 400, EDW 151 kg, 1K/2.5Ca bath, TDC Hep 2,600 load and 1,500 hourly-Total dose 9275 units per  treatment.  - mircera 200ug q2 due 09/10/21 - calcitriol 1 ug tiw - sensipar 30 mg tiw  Assessment/Plan: Septic shock: On admit requiring short-term pressors and again after TEE. 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  09/27/21 blood cultures NGTD, WBC with slow trend down   PAD s/p R AKA, L 2nd toe, L 3rd finger gangrene: Ortho hand and VVS involved. 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 Splenic Infarct/abdominal pain: anticoagulation per primary team  Splenic/Hepatic abscesses: S/P drain placed in IR 09/29/2021. Per primary ESRD: Continue HD per usual TTS schedule. Pt has requested 4 hour treatments instead of 4 hr 63min. Advised we can try 4 hour  treatments as long as labs are acceptable and we are removing adequate amounts of fluid. No heparin with HD, on heparin drip for now. Chronic hypoxic resp failure - At baseline, she is on 3L O2 at home. HTN/volume:  Optimize UF with HD. She has now reached her old EDW but need to lower further. CXR repeated still shows vascular congestion.  Anemia of ESRD: HGB 7.2. Continue Aranesp 200 mcg q Thursday. Transfused 1 unit of PRBCs with HD 09/26/2021.     Secondary Hyperparathyroidism: CorrCa/Phos ok, requesting to hold binder due to constipation but resume if phos trends back up. Continue VDRA + sensipar.   T2DM- per primary team Malnutrition in setting of Morbid Obesity - Albumin very low. Continue protein supplements.   [name], PA-C 10/07/2021, 10:16 AM  [facility]  I have seen and examined this patient and agree with plan and assessment in the above note with renal recommendations/intervention highlighted.  Broadus [address],MD 10/07/2021 12:06 PM

## 2021-10-07 NOTE — Progress Notes (Signed)
ANTICOAGULATION CONSULT NOTE  Pharmacy Consult for heparin Indication:  splenic infarct  Heparin Dosing Weight: 95 kg  Labs: Recent Labs    10/05/21 0320 10/05/21 0339 10/06/21 0323 10/07/21 0520  HGB 7.4*  --  7.2* 7.2*  HCT 25.1*  --  25.7* 24.6*  PLT 292  --  310 298  HEPARINUNFRC  --  0.32 0.32 0.36  CREATININE 4.63*  --  3.66* 5.11*     Assessment: 89 yof with hx PVD s/p R AKA 5/10 for critical limb ischemia, ESRD on HD TTS, found to have splenic infarct and transitioned from heparin infusion to apixaban on 6/14. Pharmacy consulted to transition back to heparin infusion with patient hypotensive requiring transfer to ICU s/p TEE on 6/23. Last dose of apixaban 6/23 at 1048. She is s/p IR drain placement for hepatic abscess and 6/23 noted CT finding of possible subcapsular hematoma so heparin goal was lowered.  Heparin level 0.36 at goal on 2150 units/hr. H/H low stable in the 7s. Note patient is a hard stick.   Goal of Therapy:  Heparin level 0.3-0.5 units/ml Monitor platelets by anticoagulation protocol: Yes   Plan:  Continue heparin infusion at 2150 units/hr  Monitor daily heparin level, CBC Monitor for signs/symptoms of bleeding  F/u repeat CT abdomen 7/3  Erin Hearing PharmD., BCPS Clinical Pharmacist 10/07/2021 10:25 AM

## 2021-10-07 NOTE — Progress Notes (Signed)
Occupational Therapy Treatment Patient Details Name: Courtney Gray MRN: [id number] DOB: [birth year] Today's Date: 10/07/2021   History of present illness 51 y.o. female admitted 09/09/21 with severe LUQ abdominal pain, hypoglycemia. Pt with splenic infarct, shock, L 3rd finger gangrene, s/p amputation. 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   OT comments  Pt alert today with window blinds open. Completed R UE level 2 theraband exercises, L UE AROM, supine to and from long sitting with R hand on bed rail and seated grooming with set up. Pt tolerated well.    Recommendations for follow up therapy are one component of a multi-disciplinary discharge planning process, led by the attending physician.  Recommendations may be updated based on patient status, additional functional criteria and insurance authorization.    Follow Up Recommendations  Skilled nursing-short term rehab (<3 hours/day)    Assistance Recommended at Discharge Frequent or constant Supervision/Assistance  Patient can return home with the following  Two people to help with walking and/or transfers;Two people to help with bathing/dressing/bathroom;Assistance with cooking/housework;Direct supervision/assist for medications management;Assist for transportation;Help with stairs or ramp for entrance   Equipment Recommendations  Other (comment) (defer to next venue)    Recommendations for Other Services      Precautions / Restrictions Precautions Precautions: Fall Precaution Comments: H/o R AKA (08/2021), chronic 3L O2 Restrictions Weight Bearing Restrictions: Yes RLE Weight Bearing: Non weight bearing Other Position/Activity Restrictions: abdominal drain, avoided weight through L hand       Mobility Bed Mobility Overal bed mobility: Needs Assistance Bed Mobility: Supine to Sit     Supine to sit: Modified independent (Device/Increase time)     General  bed mobility comments: uses bed rail with R hand to assist from semi reclined to long sitting in bed    Transfers                         Balance Overall balance assessment: Needs assistance Sitting-balance support: No upper extremity supported, Feet supported Sitting balance-[name] Scale: Fair Sitting balance - Comments: patient able to long sit in bed unsupported                                   ADL either performed or assessed with clinical judgement   ADL Overall ADL's : Needs assistance/impaired     Grooming: Wash/dry hands;Wash/dry face;Oral care;Bed level;Set up                                      Extremity/Trunk Assessment              Vision       Perception     Praxis      Cognition Arousal/Alertness: Awake/alert Behavior During Therapy: WFL for tasks assessed/performed Overall Cognitive Status: Within Functional Limits for tasks assessed                                 General Comments: remained alert with blinds open        Exercises Exercises: General Upper Extremity, Other exercises General Exercises - Upper Extremity Shoulder Flexion: Strengthening, Right, 10 reps, Theraband Theraband Level (Shoulder Flexion): Level 2 (Red) Shoulder ABduction: Strengthening, Right,  10 reps, Theraband Theraband Level (Shoulder Abduction): Level 2 (Red) Shoulder ADduction: Strengthening, Both, 10 reps, Seated, Theraband Theraband Level (Shoulder Adduction): Level 2 (Red) Elbow Flexion: Strengthening, Right, 10 reps, Theraband Theraband Level (Elbow Flexion): Level 2 (Red) Elbow Extension: Strengthening, Right, 10 reps, Theraband Theraband Level (Elbow Extension): Level 2 (Red) Other Exercises Other Exercises: AROM L UE x 10 all areas    Shoulder Instructions       General Comments      Pertinent Vitals/ Pain       Pain Assessment Pain Assessment: No/denies pain  Home Living                                           Prior Functioning/Environment              Frequency  Min 2X/week        Progress Toward Goals  OT Goals(current goals can now be found in the care plan section)  Progress towards OT goals: Progressing toward goals  Acute Rehab OT Goals OT Goal Formulation: With patient Time For Goal Achievement: 10/13/21 Potential to Achieve Goals: Fair ADL Goals Pt Will Perform Lower Body Bathing: with mod assist;with adaptive equipment;sitting/lateral leans Pt Will Perform Lower Body Dressing: with mod assist;with adaptive equipment;sitting/lateral leans Pt Will Transfer to Toilet: with mod assist;with transfer board;bedside commode Additional ADL Goal #1: Pt will tolerate sitting EOB for 5+ mins with no support to complete ADL tasks. Additional ADL Goal #2: Pt willcomplete bed mobility with min guard, as a precursor to Seated ADL's.  Plan Discharge plan remains appropriate    Co-evaluation                 AM-PAC OT "6 Clicks" Daily Activity     Outcome Measure   Help from another person eating meals?: None Help from another person taking care of personal grooming?: A Little Help from another person toileting, which includes using toliet, bedpan, or urinal?: Total Help from another person bathing (including washing, rinsing, drying)?: A Lot Help from another person to put on and taking off regular upper body clothing?: A Little Help from another person to put on and taking off regular lower body clothing?: Total 6 Click Score: 14    End of Session Equipment Utilized During Treatment: Oxygen (3L)  OT Visit Diagnosis: Muscle weakness (generalized) (M62.81);History of falling (Z91.81)   Activity Tolerance Patient tolerated treatment well   Patient Left in bed;with call bell/phone within reach   Nurse Communication          Time: 1916-6060 OT Time Calculation (min): 18 min  Charges: OT General Charges $OT Visit: 1 Visit OT  Treatments $Therapeutic Exercise: 8-22 mins  Cleta Alberts, OTR/L Acute Rehabilitation Services Office: [phone]  [name] 10/07/2021, 11:00 AM

## 2021-10-08 DIAGNOSIS — J9611 Chronic respiratory failure with hypoxia: Secondary | ICD-10-CM | POA: Diagnosis not present

## 2021-10-08 DIAGNOSIS — R778 Other specified abnormalities of plasma proteins: Secondary | ICD-10-CM | POA: Diagnosis not present

## 2021-10-08 DIAGNOSIS — I96 Gangrene, not elsewhere classified: Secondary | ICD-10-CM | POA: Diagnosis not present

## 2021-10-08 DIAGNOSIS — D735 Infarction of spleen: Secondary | ICD-10-CM | POA: Diagnosis not present

## 2021-10-08 LAB — CBC
HCT: 23.9 % — ABNORMAL LOW (ref 36.0–46.0)
Hemoglobin: 7.1 g/dL — ABNORMAL LOW (ref 12.0–15.0)
MCH: 28.7 pg (ref 26.0–34.0)
MCHC: 29.7 g/dL — ABNORMAL LOW (ref 30.0–36.0)
MCV: 96.8 fL (ref 80.0–100.0)
Platelets: 306 10*3/uL (ref 150–400)
RBC: 2.47 MIL/uL — ABNORMAL LOW (ref 3.87–5.11)
RDW: 21.1 % — ABNORMAL HIGH (ref 11.5–15.5)
WBC: 20.7 10*3/uL — ABNORMAL HIGH (ref 4.0–10.5)
nRBC: 0.4 % — ABNORMAL HIGH (ref 0.0–0.2)

## 2021-10-08 LAB — HEPARIN LEVEL (UNFRACTIONATED)
Heparin Unfractionated: 0.52 IU/mL (ref 0.30–0.70)
Heparin Unfractionated: 0.28 IU/mL — ABNORMAL LOW (ref 0.30–0.70)

## 2021-10-08 LAB — RENAL FUNCTION PANEL
Albumin: 1.9 g/dL — ABNORMAL LOW (ref 3.5–5.0)
Anion gap: 12 (ref 5–15)
BUN: 26 mg/dL — ABNORMAL HIGH (ref 6–20)
CO2: 25 mmol/L (ref 22–32)
Calcium: 8.4 mg/dL — ABNORMAL LOW (ref 8.9–10.3)
Chloride: 94 mmol/L — ABNORMAL LOW (ref 98–111)
Creatinine, Ser: 6.14 mg/dL — ABNORMAL HIGH (ref 0.44–1.00)
GFR, Estimated: 8 mL/min — ABNORMAL LOW (ref >60)
Glucose, Bld: 86 mg/dL (ref 70–99)
Phosphorus: 4.4 mg/dL (ref 2.5–4.6)
Potassium: 4 mmol/L (ref 3.5–5.1)
Sodium: 131 mmol/L — ABNORMAL LOW (ref 135–145)

## 2021-10-08 LAB — HEPATITIS B CORE ANTIBODY, TOTAL: Hep B Core Total Ab: NONREACTIVE

## 2021-10-08 LAB — HEPATITIS B SURFACE ANTIBODY,QUALITATIVE: Hep B S Ab: REACTIVE — AB

## 2021-10-08 LAB — GLUCOSE, CAPILLARY: Glucose-Capillary: 176 mg/dL — ABNORMAL HIGH (ref 70–99)

## 2021-10-08 LAB — HEPATITIS B SURFACE ANTIGEN: Hepatitis B Surface Ag: NONREACTIVE

## 2021-10-08 MED ORDER — HEPARIN SODIUM (PORCINE) 1000 UNIT/ML IJ SOLN
INTRAMUSCULAR | Status: AC
  Filled 2021-10-08: qty 4

## 2021-10-08 NOTE — Progress Notes (Signed)
ANTICOAGULATION CONSULT NOTE  Pharmacy Consult for heparin Indication:  splenic infarct  Heparin Dosing Weight: 95 kg  Labs: Recent Labs    10/06/21 0323 10/07/21 0520 10/08/21 0506 10/08/21 1600  HGB 7.2* 7.2* 7.1*  --   HCT 25.7* 24.6* 23.9*  --   PLT 310 298 306  --   HEPARINUNFRC 0.32 0.36 0.52 0.28*  CREATININE 3.66* 5.11* 6.14*  --      Assessment: 81 yof with hx PVD s/p R AKA 5/10 for critical limb ischemia, ESRD on HD TTS, found to have splenic infarct and transitioned from heparin infusion to apixaban on 6/14. Pharmacy consulted to transition back to heparin infusion with patient hypotensive requiring transfer to ICU s/p TEE on 6/23. Last dose of apixaban 6/23 at 1048. She is s/p IR drain placement for hepatic abscess and 6/23 noted CT finding of possible subcapsular hematoma so heparin goal was lowered.  Heparin level 0.28 at goal on 2100 units/hr. H/H low stable in the 7s. Note patient is a hard stick.   Goal of Therapy:  Heparin level 0.3-0.5 units/ml Monitor platelets by anticoagulation protocol: Yes   Plan:  Increase heparin infusion at 2150 units/hr  Monitor daily heparin level, CBC Monitor for signs/symptoms of bleeding  F/u repeat CT abdomen 7/3  Alanda Slim, PharmD, [facility] Clinical Pharmacist Please see AMION for all Pharmacists' Contact Phone Numbers 10/08/2021, 6:14 PM

## 2021-10-08 NOTE — Progress Notes (Signed)
Patient expresses anxiety of upcoming surgery provider discussed with her during HD treatment. Attempted to ease patient's emotions. Patient requested to terminate treatment early to prepare for the scheduled surgery for tomorrow. AMA form signed by patient. Also complaints of pain to left foot. Verbalized 8/10 pain and asked for medication. Provided 2mg  of Dilaudid for pain.

## 2021-10-08 NOTE — Progress Notes (Signed)
Id brief note  Reviewed ct finding  Hepatic abscess resolved -- ir planned removal hepatic drain  Increased splenic subcapsular abscess. Persistent leukocytosis   A/p Splenic/hepatic abscesses   -please discuss with IR and surgery regarding splenic abscess -continue cefepime/flagyl

## 2021-10-08 NOTE — Progress Notes (Signed)
[facility] [place] PROGRESS NOTE  ASSESSMENT / PLAN: Courtney Gray is a 51 y.o. 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  Angiogram performed 08/07/2021 shows no options for revascularization of the left lower extremity. Left above-knee amputation.  We will plan to do this on Wednesday, 10/09/2021.  SUBJECTIVE: On HD. No questions.   OBJECTIVE: BP (!) 151/68 (BP Location: Right Wrist)   Pulse 88   Temp 97.7 F (36.5 C) (Oral)   Resp 18   Ht 5\' 5"  (1.651 m)   Wt (!) 153.7 kg   SpO2 100%   BMI 56.39 kg/m   Intake/Output Summary (Last 24 hours) at 10/08/2021 1125 Last data filed at 10/08/2021 0100 Gross per 24 hour  Intake 1335.3 ml  Output --  Net 1335.3 ml    Constitutional: chronically ill. Cardiac: RRR. Pulmonary: unlabored Vascular: unchanged appearance L foot     Latest Ref Rng & Units 10/08/2021    5:06 AM 10/07/2021    5:20 AM 10/06/2021    3:23 AM  CBC  WBC 4.0 - 10.5 K/uL 20.7  19.7  21.4   Hemoglobin 12.0 - 15.0 g/dL 7.1  7.2  7.2   Hematocrit 36.0 - 46.0 % 23.9  24.6  25.7   Platelets 150 - 400 K/uL 306  298  310         Latest Ref Rng & Units 10/08/2021    5:06 AM 10/07/2021    5:20 AM 10/06/2021    3:23 AM  CMP  Glucose 70 - 99 mg/dL 86  77  111   BUN 6 - 20 mg/dL 26  20  12    Creatinine 0.44 - 1.00 mg/dL 6.14  5.11  3.66   Sodium 135 - 145 mmol/L 131  132  132   Potassium 3.5 - 5.1 mmol/L 4.0  3.9  3.8   Chloride 98 - 111 mmol/L 94  95  97   CO2 22 - 32 mmol/L 25  27  27    Calcium 8.9 - 10.3 mg/dL 8.4  8.1  7.8     Estimated Creatinine Clearance: 16.4 mL/min (A) (by C-G formula based on SCr of 6.14 mg/dL (H)).  Courtney Gray. Stanford Breed, MD [facility] Phone Number: [phone] 10/08/2021 11:25 AM

## 2021-10-08 NOTE — Progress Notes (Signed)
ANTICOAGULATION CONSULT NOTE  Pharmacy Consult for heparin Indication:  splenic infarct  Heparin Dosing Weight: 95 kg  Labs: Recent Labs    10/06/21 0323 10/07/21 0520 10/08/21 0506  HGB 7.2* 7.2* 7.1*  HCT 25.7* 24.6* 23.9*  PLT 310 298 306  HEPARINUNFRC 0.32 0.36 0.52  CREATININE 3.66* 5.11* 6.14*     Assessment: 24 yof with hx PVD s/p R AKA 5/10 for critical limb ischemia, ESRD on HD TTS, found to have splenic infarct and transitioned from heparin infusion to apixaban on 6/14. Pharmacy consulted to transition back to heparin infusion with patient hypotensive requiring transfer to ICU s/p TEE on 6/23. Last dose of apixaban 6/23 at 1048. She is s/p IR drain placement for hepatic abscess and 6/23 noted CT finding of possible subcapsular hematoma so heparin goal was lowered.  Heparin level 0.52 at goal on 2150 units/hr. H/H low stable in the 7s. Note patient is a hard stick.   Goal of Therapy:  Heparin level 0.3-0.5 units/ml Monitor platelets by anticoagulation protocol: Yes   Plan:  Decrease heparin infusion at 2100 units/hr  Heparin level in 8 hours Monitor daily heparin level, CBC Monitor for signs/symptoms of bleeding  F/u repeat CT abdomen 7/3  Jaclene Bartelt A. Levada Dy, PharmD, BCPS, FNKF Clinical Pharmacist [place] Please utilize Amion for appropriate phone number to reach the unit pharmacist ([place])  10/08/2021 7:41 AM

## 2021-10-08 NOTE — Progress Notes (Signed)
PT Cancellation Note  Patient Details Name: Courtney Gray MRN: [id number] DOB: [birth year]   Cancelled Treatment:    Reason Eval/Treat Not Completed: Patient at procedure or test/unavailable. Pt in HD.   [place] 10/08/2021, [place] Office [phone]

## 2021-10-08 NOTE — TOC Progression Note (Signed)
Transition of Care [facility]) - Progression Note    Patient Details  Name: Courtney Gray MRN: [id number] Date of Birth: 05-12-1970  Transition of Care [facility]) CM/SW , [name] Phone Number: 10/08/2021, 1:00 PM  Clinical Narrative:     CSW called to follow up on referral for possible LTC SNF bed for patient at [facility]. CSW spoke with Altha Harm who request for CSW to refax referral over for review. CSW faxed over referral for review. CSW awaiting determination. CSW will continue to follow and assist with patient dc planning needs.  Expected Discharge Plan: Skilled Nursing Facility Barriers to Discharge: Continued Medical Work up  Expected Discharge Plan and Services Expected Discharge Plan: [place] In-house Referral: Clinical Social Work Discharge Planning Services: CM Consult Post Acute Care Choice: Durable Medical Equipment Living arrangements for the past 2 months: Single Family Home                                       Social Determinants of Health (SDOH) Interventions    Readmission Risk Interventions    09/16/2021    4:36 PM 08/28/2021   11:50 AM 07/12/2021   12:38 PM  Readmission Risk Prevention Plan  Transportation Screening Complete Complete Complete  Medication Review Press photographer)  Complete Complete  PCP or Specialist appointment within 3-5 days of discharge  Complete Complete  HRI or Home Care Consult Complete Complete Complete  SW Recovery Care/Counseling Consult Complete Complete Complete  Palliative Care Screening  Not Applicable Not Applicable  Skilled Nursing Facility Complete Not Applicable Complete

## 2021-10-08 NOTE — Progress Notes (Addendum)
Called received from pharmacy to change Heparin drip rate to 2100. Relayed information to floor nurse who came to unit to change drip rate.

## 2021-10-08 NOTE — Progress Notes (Signed)
Received patient in bed, alert and oriented. Informed consent signed and in chart.  Time tx completed: 1032  HD treatment completed. Patient tolerated well. Right IJ HD catheter without signs and symptoms of complications. Patient expressed wanting to terminate treatment before prescribed duration d/t anxiety from being informed of upcoming surgery taking place tomorrow. 30 minutes were left in treatment. AMA form signed. Patient transported back to the room, alert and orient and in no acute distress. Report given to bedside RN.  Total UF removed: 3L  Medication given: Dilaudid 2mg    Post HD VS:   BP 151/68  O2 100% Resp 18 Temp- 97.7  Post HD weight: 153.7kg

## 2021-10-08 NOTE — Progress Notes (Signed)
Physical Therapy Treatment Patient Details Name: Courtney Gray MRN: [id number] DOB: [birth year] Today's Date: 10/08/2021   History of Present Illness 51 y.o. female admitted 09/09/21 with severe LUQ abdominal pain, hypoglycemia. Pt with splenic infarct, shock, L 3rd finger gangrene, s/p amputation. 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    PT Comments    Used PT session to work on core strengthening and long sitting to focus on maximizing pt's function at the bed level and for continued mechanical lifts to chair. Pt to have lt AKA tomorrow. Pt asking what she will be able to do after that. Explained to pt that doing things from a w/c level or from bed level is where to start. Asked pt to think of a couple of things she wants to be able to do and let us know so we can work on those areas with her. Will follow up later in the week after her lt AKA.    Recommendations for follow up therapy are one component of a multi-disciplinary discharge planning process, led by the attending physician.  Recommendations may be updated based on patient status, additional functional criteria and insurance authorization.  Follow Up Recommendations  Long-term institutional care without follow-up therapy     Assistance Recommended at Discharge Frequent or constant Supervision/Assistance  Patient can return home with the following Two people to help with walking and/or transfers;Two people to help with bathing/dressing/bathroom;Assist for transportation   Equipment Recommendations  Other (comment) (to be provided by LTC facility)    Recommendations for Other Services       Precautions / Restrictions Precautions Precautions: Fall Precaution Comments: H/o R AKA (08/2021), chronic 3L O2 Restrictions Other Position/Activity Restrictions: abdominal drain, avoided weight through L hand     Mobility  Bed Mobility Overal bed mobility: Needs  Assistance Bed Mobility: Supine to Sit     Supine to sit: Modified independent (Device/Increase time)     General bed mobility comments: uses bed rail with R hand to assist from semi reclined to long sitting in bed    Transfers                        Ambulation/Gait                   Stairs             Wheelchair Mobility    Modified Rankin (Stroke Patients Only)       Balance Overall balance assessment: Needs assistance Sitting-balance support: No upper extremity supported, Feet supported Sitting balance-[name] Scale: Fair Sitting balance - Comments: patient able to long sit in bed unsupported                                    Cognition Arousal/Alertness: Awake/alert Behavior During Therapy: WFL for tasks assessed/performed Overall Cognitive Status: Within Functional Limits for tasks assessed                                 General Comments: Pt asking questions about what she would be able to do after 2nd amputation        Exercises General Exercises - Upper Extremity Shoulder Flexion: Strengthening, Right, 10 reps, Theraband, Left, AROM Theraband Level (Shoulder Flexion): Level 2 (Red) Shoulder Extension:  Strengthening, Right, 10 reps, Seated, Theraband Shoulder ABduction: Strengthening, Right, 10 reps, Theraband Theraband Level (Shoulder Abduction): Level 2 (Red) Shoulder Horizontal ABduction: Strengthening, Right, 10 reps, Seated, Theraband Elbow Flexion: Strengthening, Right, Theraband, 15 reps Theraband Level (Elbow Flexion): Level 2 (Red) Elbow Extension: Strengthening, Right, Theraband, 15 reps, Left, AROM, 10 reps Theraband Level (Elbow Extension): Level 2 (Red) Other Exercises Other Exercises: 2 x 10 reps of cross body punches focusing on core rotation    General Comments        Pertinent Vitals/Pain Pain Assessment Pain Assessment: No/denies pain    Home Living                           Prior Function            PT Goals (current goals can now be found in the care plan section) Progress towards PT goals: Not progressing toward goals - comment    Frequency    Min 2X/week      PT Plan Current plan remains appropriate    Co-evaluation              AM-PAC PT "6 Clicks" Mobility   Outcome Measure  Help needed turning from your back to your side while in a flat bed without using bedrails?: A Lot Help needed moving from lying on your back to sitting on the side of a flat bed without using bedrails?: A Lot Help needed moving to and from a bed to a chair (including a wheelchair)?: Total Help needed standing up from a chair using your arms (e.g., wheelchair or bedside chair)?: Total Help needed to walk in hospital room?: Total Help needed climbing 3-5 steps with a railing? : Total 6 Click Score: 8    End of Session Equipment Utilized During Treatment: Oxygen Activity Tolerance: Patient tolerated treatment well Patient left: in bed;with call bell/phone within reach   PT Visit Diagnosis: Other abnormalities of gait and mobility (R26.89);Muscle weakness (generalized) (M62.81)     Time: 1444-1500 PT Time Calculation (min) (ACUTE ONLY): 16 min  Charges:  $Therapeutic Exercise: 8-22 mins                     [facility] [place] 10/08/2021, 3:38 PM

## 2021-10-08 NOTE — Progress Notes (Signed)
[facility]                                                                              Courtney Gray, is a 51 y.o. female, DOB - 04-23-1970, WFU:[id number] Admit date - 09/09/2021    Outpatient Primary MD for the patient is Monico Blitz, MD  LOS - 28  days  Chief Complaint  Patient presents with   Nausea   Constipation       Brief summary   Courtney Gray is a 51 y.o. 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 She was hypoglycemic, febrile with CT revealing hypoenhancement in the spleen consistent with infarct. Started on IV heparin, empiric antibiotics for septic shock of unclear origin. Later taken for left middle finger amputation 6/19, Ultimately was taken for TEE 6/23 which revealed PFO without vegetation and required transfer to ICU for septic shock found to have hepatic and splenic abscesses. ID has guided IV antibiotics, IR placed hepatic abscess drain 6/25.    Assessment & Plan   Septic shock Bacteremia bacteroids thetaiotaomicron-beta-lactamase positive on 6/17: -Drain management per IR -Continue IV cefepime with dialysis and oral metronidazole -ID following  Hepatic and splenic abscess -Status post CT-guided drainage of liver abscess by IR on 6/25 with purulent fluid -IR following, continue 3 times daily flushes -Will need repeat imaging/possible drain injection once output less than 10 mL/daily and consideration of drain removal if output is less than 10 mL/daily -CT abdomen pelvis repeated on 7/3 show near completion of previously seen hepatic fluid abscess. Large bilobed subcapsular fluid collection 10.7 cm still persisting.  Small volume perihepatic ascites, small to moderate size loculated left pleural effusion -Continue IV cefepime,  Flagyl -IR reconsulted as a subcapsular collection drainage persisting and leukocytosis.  ID requesting surgery to reevaluate (paged)  Splenic infarct -No embolic source noted on echo or CT. -TEE on 6/23 showed normal LV function, negative for endocarditis.  Calcified mass in the right atrium-etiology unclear, possibly calcified thrombus. -eliquis was placed on hold due to subcapsular hematoma 5.9x10x8.9, continue IV heparin -Continue to hold Eliquis  Left third digit ischemic gangrene Severe PAD with prior AKA New left first and second toe gangrene -Seen by orthopedics and vascular surgery, amputation of left third digit on 6/19 -Continue pain control, aspirin, statin -Appreciate vascular surgery reevaluation, recommended left AKA, plan on 10/09/2021, n.p.o. after midnight   Diabetes mellitus type 2, insulin-dependent, uncontrolled with hypoglycemia -Hemoglobin A1c 5.7% -CBGs controlled, continue current regimen Recent Labs    10/05/21 1733 10/05/21 2127 10/06/21 2107 10/07/21 1624  GLUCAP 123* 104* 223* 154*     Hypotension, likely sedation related -BP now stable  Acute on chronic respiratory failure with hypoxia Suspected OHS, OSA -Continue O2, 2 to 3 L via [place], at baseline   ESRD on hemodialysis, TTS -HD per nephrology   Anemia, multifactorial -Likely due to CKD, also had menorrhagia, now resolved -Status post 2 unit  packed RBCs 5/23, 6/22 with HD -Continue Aranesp.   -Hemoglobin 7.1, follow H&H in a.m., transfuse if less than 7,  Menorrhagia -Resolved, Megace discontinued  Demand ischemia with elevated troponin in the setting of anemia, sepsis -ACS ruled out.  Currently no chest pain.  Essential hypertension -Initially was hypotensive, requiring pressors, midodrine -Continue amlodipine, low-dose Coreg   Hyperlipidemia  -Continue statin   GERD  -continue PPI  Acute on chronic ambulatory dysfunction -Continue PT OT, recommended SNF    Pressure ulcer  stage II left and right buttock POA -Continue local wound care.  Change position every 2 hours  Morbid obesity Estimated body mass index is 56.39 kg/m as calculated from the following:   Height as of this encounter: 5\' 5"  (1.651 m).   Weight as of this encounter: 153.7 kg.  Code Status: Full code DVT Prophylaxis:    Level of Care: Level of care: Med-Surg Family Communication: Updated patient's daughter at the bedside on 10/06/2021.  Requested SNF when patient is medically ready   Disposition Plan:      Remains inpatient appropriate: On IV antibiotics, work-up in process Procedures:  HD  Consultants:   Nephrology Infectious disease Interventional radiology General surgery Vascular surgery  Antimicrobials:   Anti-infectives (From admission, onward)    Start     Dose/Rate Route Frequency Ordered Stop   09/25/21 0915  metroNIDAZOLE (FLAGYL) IVPB 500 mg        500 mg 100 mL/hr over 60 Minutes Intravenous Every 12 hours 09/25/21 0822     09/13/21 1000  vancomycin (VANCOCIN) IVPB 1000 mg/200 mL premix        1,000 mg 200 mL/hr over 60 Minutes Intravenous STAT 09/13/21 0904 09/13/21 1140   09/10/21 1200  vancomycin (VANCOCIN) IVPB 1000 mg/200 mL premix  Status:  Discontinued        1,000 mg 200 mL/hr over 60 Minutes Intravenous Every T-Th-Sa (Hemodialysis) 09/10/21 0620 09/17/21 1013   09/10/21 1200  ceFEPIme (MAXIPIME) 2 g in sodium chloride 0.9 % 100 mL IVPB        2 g 200 mL/hr over 30 Minutes Intravenous Every T-Th-Sa (Hemodialysis) 09/10/21 1107     09/10/21 0445  vancomycin (VANCOREADY) IVPB 2000 mg/400 mL        2,000 mg 200 mL/hr over 120 Minutes Intravenous  Once 09/10/21 0435 09/10/21 0800   09/10/21 0430  ceFEPIme (MAXIPIME) 2 g in sodium chloride 0.9 % 100 mL IVPB        2 g 200 mL/hr over 30 Minutes Intravenous  Once 09/10/21 0420 09/10/21 0550          Medications  amLODipine  10 mg Oral Daily   aspirin EC  81 mg Oral Daily   atorvastatin  10 mg Oral  QHS   calcitRIOL  1 mcg Oral Q T,Th,Sa-HD   carvedilol  3.125 mg Oral BID WC   cinacalcet  30 mg Oral Q T,Th,Sa-HD   darbepoetin (ARANESP) injection - DIALYSIS  200 mcg Intravenous Q Sat-HD   diclofenac Sodium  2 g Topical QID   feeding supplement  1 Container Oral TID BM   feeding supplement (NEPRO CARB STEADY)  237 mL Oral BID BM   gabapentin  100 mg Oral Q12H   gabapentin  200 mg Oral Q T,Th,Sa-HD   Gerhardt's butt cream   Topical BID   heparin sodium (porcine)       insulin aspart  1-3 Units Subcutaneous TID WC   metoCLOPramide (REGLAN) injection  5  mg Intravenous Q6H   multivitamin  1 tablet Oral QHS   pantoprazole  40 mg Oral BID   polyethylene glycol  17 g Oral Daily   sodium chloride flush  10-40 mL Intracatheter Q12H   sodium chloride flush  3 mL Intravenous Q12H   sodium chloride flush  5 mL Intracatheter Q8H   sodium phosphate  1 enema Rectal Once      Subjective:   Courtney Gray was seen and examined.  No acute complaints, no worsening pain, chest pain or shortness of breath.  Left first and second toe gangrene.     Objective:   Vitals:   10/08/21 0945 10/08/21 1000 10/08/21 1033 10/08/21 1306  BP: (!) 153/67 (!) 153/69 (!) 151/68 135/74  Pulse: 85 87 88 84  Resp: 12 (!) 23 18 17   Temp:   97.7 F (36.5 C)   TempSrc:   Oral   SpO2: 100% 100% 100% 100%  Weight:   (!) 153.7 kg   Height:        Intake/Output Summary (Last 24 hours) at 10/08/2021 1445 Last data filed at 10/08/2021 0100 Gross per 24 hour  Intake 1335.3 ml  Output --  Net 1335.3 ml     Wt Readings from Last 3 Encounters:  10/08/21 (!) 153.7 kg  09/03/21 (!) 150.6 kg  08/28/21 (!) 150.1 kg   Physical Exam General: Alert and oriented x 3, NAD Cardiovascular: S1 S2 clear, RRR.  Respiratory: Diminished breath sound at the bases Gastrointestinal: Soft, nontender,  NBS, drain+ Ext: right AKA Neuro: no new deficits Skin: Left first and second toe gangrene Psych: Normal affect and  demeanor, alert and oriented x3     Data Reviewed:  I have personally reviewed following labs    CBC Lab Results  Component Value Date   WBC 20.7 (H) 10/08/2021   RBC 2.47 (L) 10/08/2021   HGB 7.1 (L) 10/08/2021   HCT 23.9 (L) 10/08/2021   MCV 96.8 10/08/2021   MCH 28.7 10/08/2021   PLT 306 10/08/2021   MCHC 29.7 (L) 10/08/2021   RDW 21.1 (H) 10/08/2021   LYMPHSABS 1.0 09/28/2021   MONOABS 1.3 (H) 09/28/2021   EOSABS 0.0 09/28/2021   BASOSABS 0.0 58/59/2924     Last metabolic panel Lab Results  Component Value Date   NA 131 (L) 10/08/2021   K 4.0 10/08/2021   CL 94 (L) 10/08/2021   CO2 25 10/08/2021   BUN 26 (H) 10/08/2021   CREATININE 6.14 (H) 10/08/2021   GLUCOSE 86 10/08/2021   GFRNONAA 8 (L) 10/08/2021   GFRAA 6 (L) 11/29/2019   CALCIUM 8.4 (L) 10/08/2021   PHOS 4.4 10/08/2021   PROT 6.0 (L) 09/14/2021   ALBUMIN 1.9 (L) 10/08/2021   BILITOT 0.7 09/14/2021   ALKPHOS 124 09/14/2021   Gray 16 09/14/2021   ALT 10 09/14/2021   ANIONGAP 12 10/08/2021    CBG (last 3)  Recent Labs    10/05/21 2127 10/06/21 2107 10/07/21 1624  GLUCAP 104* 223* 154*       Imir Brumbach M.D. [facility] 10/08/2021, 2:45 PM  Available via Epic secure chat 7am-7pm After 7 pm, please refer to night coverage provider listed on amion.

## 2021-10-08 NOTE — Procedures (Signed)
I was present at this dialysis session. I have reviewed the session itself and made appropriate changes.   Vital signs in last 24 hours:  Temp:  [98.5 F (36.9 C)-98.6 F (37 C)] 98.5 F (36.9 C) (07/04 0635) Pulse Rate:  [81-87] 81 (07/04 0730) Resp:  [13-22] 13 (07/04 0730) BP: (132-144)/(60-70) 139/64 (07/04 0730) SpO2:  [98 %-100 %] 100 % (07/04 0730) Weight:  [154.4 kg-157 kg] 157 kg (07/04 0635) Weight change:  Filed Weights   10/06/21 0411 10/08/21 0500 10/08/21 0635  Weight: (!) 151.6 kg (!) 154.4 kg (!) 157 kg    Recent Labs  Lab 10/08/21 0506  NA 131*  K 4.0  CL 94*  CO2 25  GLUCOSE 86  BUN 26*  CREATININE 6.14*  CALCIUM 8.4*  PHOS 4.4    Recent Labs  Lab 10/06/21 0323 10/07/21 0520 10/08/21 0506  WBC 21.4* 19.7* 20.7*  HGB 7.2* 7.2* 7.1*  HCT 25.7* 24.6* 23.9*  MCV 99.2 98.4 96.8  PLT 310 298 306    Scheduled Meds:  amLODipine  10 mg Oral Daily   aspirin EC  81 mg Oral Daily   atorvastatin  10 mg Oral QHS   calcitRIOL  1 mcg Oral Q T,Th,Sa-HD   carvedilol  3.125 mg Oral BID WC   cinacalcet  30 mg Oral Q T,Th,Sa-HD   darbepoetin (ARANESP) injection - DIALYSIS  200 mcg Intravenous Q Sat-HD   diclofenac Sodium  2 g Topical QID   feeding supplement  1 Container Oral TID BM   feeding supplement (NEPRO CARB STEADY)  237 mL Oral BID BM   gabapentin  100 mg Oral Q12H   gabapentin  200 mg Oral Q T,Th,Sa-HD   Gerhardt's butt cream   Topical BID   insulin aspart  1-3 Units Subcutaneous TID WC   metoCLOPramide (REGLAN) injection  5 mg Intravenous Q6H   multivitamin  1 tablet Oral QHS   pantoprazole  40 mg Oral BID   polyethylene glycol  17 g Oral Daily   sodium chloride flush  10-40 mL Intracatheter Q12H   sodium chloride flush  3 mL Intravenous Q12H   sodium chloride flush  5 mL Intracatheter Q8H   sodium phosphate  1 enema Rectal Once   Continuous Infusions:  sodium chloride 10 mL/hr at 10/08/21 0100   ceFEPime (MAXIPIME) IV 200 mL/hr at 10/08/21  0100   heparin 2,100 Units/hr (10/08/21 0750)   metronidazole 100 mL/hr at 10/08/21 0100   PRN Meds:.acetaminophen **OR** acetaminophen, albuterol, fentaNYL, glucagon (human recombinant), glucose, HYDROmorphone, labetalol, lactulose, lidocaine (PF), lidocaine-prilocaine, midazolam, nitroGLYCERIN, ondansetron **OR** ondansetron (ZOFRAN) IV, pentafluoroprop-tetrafluoroeth, prochlorperazine, senna-docusate   Donetta Potts,  MD 10/08/2021, 8:06 AM

## 2021-10-09 ENCOUNTER — Inpatient Hospital Stay (HOSPITAL_COMMUNITY): Payer: 59

## 2021-10-09 ENCOUNTER — Other Ambulatory Visit: Payer: Self-pay

## 2021-10-09 ENCOUNTER — Encounter (HOSPITAL_COMMUNITY)
Admission: EM | Disposition: A | Discharge status: Skilled Nursing Facility | Payer: Self-pay | Source: Home / Self Care | Attending: Internal Medicine

## 2021-10-09 ENCOUNTER — Encounter (HOSPITAL_COMMUNITY): Payer: Self-pay | Admitting: Internal Medicine

## 2021-10-09 DIAGNOSIS — E1151 Type 2 diabetes mellitus with diabetic peripheral angiopathy without gangrene: Secondary | ICD-10-CM

## 2021-10-09 DIAGNOSIS — Z7984 Long term (current) use of oral hypoglycemic drugs: Secondary | ICD-10-CM | POA: Diagnosis not present

## 2021-10-09 DIAGNOSIS — I70262 Atherosclerosis of native arteries of extremities with gangrene, left leg: Secondary | ICD-10-CM | POA: Diagnosis not present

## 2021-10-09 DIAGNOSIS — I998 Other disorder of circulatory system: Secondary | ICD-10-CM | POA: Diagnosis not present

## 2021-10-09 DIAGNOSIS — Z794 Long term (current) use of insulin: Secondary | ICD-10-CM

## 2021-10-09 DIAGNOSIS — D735 Infarction of spleen: Secondary | ICD-10-CM | POA: Diagnosis not present

## 2021-10-09 DIAGNOSIS — I96 Gangrene, not elsewhere classified: Secondary | ICD-10-CM | POA: Diagnosis not present

## 2021-10-09 DIAGNOSIS — D72829 Elevated white blood cell count, unspecified: Secondary | ICD-10-CM | POA: Diagnosis not present

## 2021-10-09 DIAGNOSIS — E1122 Type 2 diabetes mellitus with diabetic chronic kidney disease: Secondary | ICD-10-CM | POA: Diagnosis not present

## 2021-10-09 HISTORY — PX: AMPUTATION: SHX166

## 2021-10-09 LAB — CBC
HCT: 25.6 % — ABNORMAL LOW (ref 36.0–46.0)
Hemoglobin: 7.3 g/dL — ABNORMAL LOW (ref 12.0–15.0)
MCH: 28.4 pg (ref 26.0–34.0)
MCHC: 28.5 g/dL — ABNORMAL LOW (ref 30.0–36.0)
MCV: 99.6 fL (ref 80.0–100.0)
Platelets: 307 10*3/uL (ref 150–400)
RBC: 2.57 MIL/uL — ABNORMAL LOW (ref 3.87–5.11)
RDW: 21.2 % — ABNORMAL HIGH (ref 11.5–15.5)
WBC: 21.3 10*3/uL — ABNORMAL HIGH (ref 4.0–10.5)
nRBC: 0.6 % — ABNORMAL HIGH (ref 0.0–0.2)

## 2021-10-09 LAB — POCT I-STAT, CHEM 8
BUN: 20 mg/dL (ref 6–20)
Calcium, Ion: 0.97 mmol/L — ABNORMAL LOW (ref 1.15–1.40)
Chloride: 95 mmol/L — ABNORMAL LOW (ref 98–111)
Creatinine, Ser: 5.5 mg/dL — ABNORMAL HIGH (ref 0.44–1.00)
Glucose, Bld: 88 mg/dL (ref 70–99)
HCT: 28 % — ABNORMAL LOW (ref 36.0–46.0)
Hemoglobin: 9.5 g/dL — ABNORMAL LOW (ref 12.0–15.0)
Potassium: 3.7 mmol/L (ref 3.5–5.1)
Sodium: 131 mmol/L — ABNORMAL LOW (ref 135–145)
TCO2: 26 mmol/L (ref 22–32)

## 2021-10-09 LAB — RENAL FUNCTION PANEL
Albumin: 1.9 g/dL — ABNORMAL LOW (ref 3.5–5.0)
Anion gap: 13 (ref 5–15)
BUN: 18 mg/dL (ref 6–20)
CO2: 25 mmol/L (ref 22–32)
Calcium: 8.3 mg/dL — ABNORMAL LOW (ref 8.9–10.3)
Chloride: 94 mmol/L — ABNORMAL LOW (ref 98–111)
Creatinine, Ser: 4.44 mg/dL — ABNORMAL HIGH (ref 0.44–1.00)
GFR, Estimated: 11 mL/min — ABNORMAL LOW (ref >60)
Glucose, Bld: 127 mg/dL — ABNORMAL HIGH (ref 70–99)
Phosphorus: 3.7 mg/dL (ref 2.5–4.6)
Potassium: 3.6 mmol/L (ref 3.5–5.1)
Sodium: 132 mmol/L — ABNORMAL LOW (ref 135–145)

## 2021-10-09 LAB — HEPARIN LEVEL (UNFRACTIONATED): Heparin Unfractionated: 0.3 IU/mL (ref 0.30–0.70)

## 2021-10-09 LAB — GLUCOSE, CAPILLARY
Glucose-Capillary: 93 mg/dL (ref 70–99)
Glucose-Capillary: 92 mg/dL (ref 70–99)
Glucose-Capillary: 90 mg/dL (ref 70–99)

## 2021-10-09 LAB — PROTIME-INR
INR: 1.3 — ABNORMAL HIGH (ref 0.8–1.2)
Prothrombin Time: 16.5 seconds — ABNORMAL HIGH (ref 11.4–15.2)

## 2021-10-09 SURGERY — AMPUTATION, ABOVE KNEE
Anesthesia: General | Site: Knee | Laterality: Left

## 2021-10-09 MED ORDER — POLYETHYLENE GLYCOL 3350 17 G PO PACK
17.0000 g | PACK | Freq: Every day | ORAL | Status: DC | PRN
Start: 2021-10-09 — End: 2021-10-21

## 2021-10-09 MED ORDER — HEPARIN (PORCINE) 25000 UT/250ML-% IV SOLN
2150.0000 [IU]/h | INTRAVENOUS | Status: DC
Start: 2021-10-09 — End: 2021-10-10
  Administered 2021-10-10: 2150 [IU]/h via INTRAVENOUS
  Filled 2021-10-09: qty 250

## 2021-10-09 MED ORDER — BISACODYL 10 MG RE SUPP
10.0000 mg | Freq: Every day | RECTAL | Status: DC | PRN
  Administered 2021-10-15: 10 mg via RECTAL
  Filled 2021-10-09 (×2): qty 1

## 2021-10-09 MED ORDER — POTASSIUM CHLORIDE CRYS ER 20 MEQ PO TBCR: 20.0000 meq | EXTENDED_RELEASE_TABLET | Freq: Every day | ORAL | Status: DC | PRN

## 2021-10-09 MED ORDER — ORAL CARE MOUTH RINSE: 15.0000 mL | Freq: Once | OROMUCOSAL | Status: AC

## 2021-10-09 MED ORDER — INSULIN ASPART 100 UNIT/ML IJ SOLN: 0.0000 [IU] | INTRAMUSCULAR | Status: DC | PRN

## 2021-10-09 MED ORDER — DEXAMETHASONE SODIUM PHOSPHATE 10 MG/ML IJ SOLN
INTRAMUSCULAR | Status: DC | PRN
  Administered 2021-10-09: 10 mg via INTRAVENOUS

## 2021-10-09 MED ORDER — PHENYLEPHRINE HCL (PRESSORS) 10 MG/ML IV SOLN
INTRAVENOUS | Status: DC | PRN
  Administered 2021-10-09: 160 ug via INTRAVENOUS
  Administered 2021-10-09 (×3): 80 ug via INTRAVENOUS

## 2021-10-09 MED ORDER — PHENOL 1.4 % MT LIQD: 1.0000 | OROMUCOSAL | Status: DC | PRN

## 2021-10-09 MED ORDER — GUAIFENESIN-DM 100-10 MG/5ML PO SYRP: 15.0000 mL | ORAL_SOLUTION | ORAL | Status: DC | PRN

## 2021-10-09 MED ORDER — NALOXONE HCL 0.4 MG/ML IJ SOLN: 0.4000 mg | INTRAMUSCULAR | Status: DC | PRN

## 2021-10-09 MED ORDER — FENTANYL CITRATE (PF) 250 MCG/5ML IJ SOLN
INTRAMUSCULAR | Status: DC | PRN
  Administered 2021-10-09: 150 ug via INTRAVENOUS

## 2021-10-09 MED ORDER — LACTATED RINGERS IV SOLN: INTRAVENOUS | Status: DC

## 2021-10-09 MED ORDER — SUCCINYLCHOLINE CHLORIDE 200 MG/10ML IV SOSY
PREFILLED_SYRINGE | INTRAVENOUS | Status: DC | PRN
  Administered 2021-10-09: 140 mg via INTRAVENOUS

## 2021-10-09 MED ORDER — CHLORHEXIDINE GLUCONATE 0.12 % MT SOLN
OROMUCOSAL | Status: AC
  Administered 2021-10-09: 15 mL via OROMUCOSAL
  Filled 2021-10-09: qty 15

## 2021-10-09 MED ORDER — FENTANYL CITRATE (PF) 100 MCG/2ML IJ SOLN
INTRAMUSCULAR | Status: AC
  Administered 2021-10-09: 25 ug via INTRAVENOUS
  Filled 2021-10-09: qty 2

## 2021-10-09 MED ORDER — HYDROMORPHONE HCL 1 MG/ML IJ SOLN
1.0000 mg | Freq: Once | INTRAMUSCULAR | Status: AC
  Administered 2021-10-09: 1 mg via INTRAVENOUS

## 2021-10-09 MED ORDER — CHLORHEXIDINE GLUCONATE 0.12 % MT SOLN
15.0000 mL | Freq: Once | OROMUCOSAL | Status: AC
Start: 2021-10-09 — End: 2021-10-09

## 2021-10-09 MED ORDER — MIDAZOLAM HCL 2 MG/2ML IJ SOLN
INTRAMUSCULAR | Status: AC
Start: 2021-10-09 — End: ?
  Filled 2021-10-09: qty 2

## 2021-10-09 MED ORDER — HYDROMORPHONE HCL 1 MG/ML IJ SOLN
1.0000 mg | INTRAMUSCULAR | Status: DC | PRN
  Administered 2021-10-09 – 2021-10-15 (×10): 1 mg via INTRAVENOUS
  Filled 2021-10-09 (×12): qty 1

## 2021-10-09 MED ORDER — HYDRALAZINE HCL 20 MG/ML IJ SOLN: 5.0000 mg | INTRAMUSCULAR | Status: DC | PRN

## 2021-10-09 MED ORDER — LOPERAMIDE HCL 2 MG PO CAPS
2.0000 mg | ORAL_CAPSULE | ORAL | Status: DC | PRN
  Filled 2021-10-09: qty 1

## 2021-10-09 MED ORDER — PROPOFOL 10 MG/ML IV BOLUS
INTRAVENOUS | Status: DC | PRN
  Administered 2021-10-09: 100 mg via INTRAVENOUS

## 2021-10-09 MED ORDER — MAGNESIUM SULFATE 2 GM/50ML IV SOLN: 2.0000 g | Freq: Every day | INTRAVENOUS | Status: DC | PRN

## 2021-10-09 MED ORDER — PHENYLEPHRINE 80 MCG/ML (10ML) SYRINGE FOR IV PUSH (FOR BLOOD PRESSURE SUPPORT)
PREFILLED_SYRINGE | INTRAVENOUS | Status: AC
  Filled 2021-10-09: qty 10

## 2021-10-09 MED ORDER — FENTANYL CITRATE (PF) 100 MCG/2ML IJ SOLN
25.0000 ug | INTRAMUSCULAR | Status: DC | PRN
  Administered 2021-10-09: 50 ug via INTRAVENOUS

## 2021-10-09 MED ORDER — PROPOFOL 10 MG/ML IV BOLUS
INTRAVENOUS | Status: AC
Start: 2021-10-09 — End: ?
  Filled 2021-10-09: qty 20

## 2021-10-09 MED ORDER — AMLODIPINE BESYLATE 10 MG PO TABS
10.0000 mg | ORAL_TABLET | Freq: Every day | ORAL | Status: DC
Start: 2021-10-09 — End: 2021-10-20
  Administered 2021-10-09 – 2021-10-19 (×5): 10 mg via ORAL
  Filled 2021-10-09 (×8): qty 1

## 2021-10-09 MED ORDER — ACETAMINOPHEN 10 MG/ML IV SOLN: 1000.0000 mg | Freq: Once | INTRAVENOUS | Status: DC | PRN

## 2021-10-09 MED ORDER — FENTANYL CITRATE (PF) 250 MCG/5ML IJ SOLN
INTRAMUSCULAR | Status: AC
  Filled 2021-10-09: qty 5

## 2021-10-09 MED ORDER — 0.9 % SODIUM CHLORIDE (POUR BTL) OPTIME
TOPICAL | Status: DC | PRN
  Administered 2021-10-09: 1000 mL

## 2021-10-09 MED ORDER — ALUM & MAG HYDROXIDE-SIMETH 200-200-20 MG/5ML PO SUSP: 15.0000 mL | ORAL | Status: DC | PRN

## 2021-10-09 MED ORDER — ONDANSETRON HCL 4 MG/2ML IJ SOLN: 4.0000 mg | Freq: Once | INTRAMUSCULAR | Status: DC | PRN

## 2021-10-09 MED ORDER — DOCUSATE SODIUM 100 MG PO CAPS
100.0000 mg | ORAL_CAPSULE | Freq: Every day | ORAL | Status: DC
  Administered 2021-10-11 – 2021-10-20 (×8): 100 mg via ORAL
  Filled 2021-10-09 (×11): qty 1

## 2021-10-09 MED ORDER — MIDAZOLAM HCL 2 MG/2ML IJ SOLN
INTRAMUSCULAR | Status: DC | PRN
  Administered 2021-10-09: 2 mg via INTRAVENOUS

## 2021-10-09 MED ORDER — ONDANSETRON HCL 4 MG/2ML IJ SOLN
INTRAMUSCULAR | Status: DC | PRN
  Administered 2021-10-09: 4 mg via INTRAVENOUS

## 2021-10-09 MED ORDER — SODIUM CHLORIDE 0.9 % IV SOLN: INTRAVENOUS | Status: DC

## 2021-10-09 MED ORDER — OXYCODONE HCL 5 MG PO TABS: 5.0000 mg | ORAL_TABLET | ORAL | Status: DC | PRN

## 2021-10-09 MED ORDER — OXYCODONE HCL 5 MG PO TABS
10.0000 mg | ORAL_TABLET | ORAL | Status: DC | PRN
  Administered 2021-10-10 – 2021-10-17 (×15): 15 mg via ORAL
  Filled 2021-10-09 (×17): qty 3

## 2021-10-09 MED ORDER — DIPHENHYDRAMINE HCL 12.5 MG/5ML PO ELIX
12.5000 mg | ORAL_SOLUTION | ORAL | Status: DC | PRN
  Filled 2021-10-09: qty 10

## 2021-10-09 SURGICAL SUPPLY — 55 items
BAG COUNTER SPONGE SURGICOUNT (BAG) ×2 IMPLANT
BIT DRILL 5/64X5 DISP (BIT) IMPLANT
BLADE SAGITTAL (BLADE)
BLADE SAGITTAL 25.0X1.19X90 (BLADE) IMPLANT
BLADE SAW GIGLI 510 (BLADE) ×2 IMPLANT
BLADE SAW THK.89X75X18XSGTL (BLADE) IMPLANT
BLADE SURG 21 STRL SS (BLADE) ×2 IMPLANT
BNDG COHESIVE 6X5 TAN STRL LF (GAUZE/BANDAGES/DRESSINGS) ×2 IMPLANT
BNDG ELASTIC 4X5.8 VLCR STR LF (GAUZE/BANDAGES/DRESSINGS) ×2 IMPLANT
BNDG ELASTIC 6X5.8 VLCR STR LF (GAUZE/BANDAGES/DRESSINGS) ×2 IMPLANT
CANISTER SUCT 3000ML PPV (MISCELLANEOUS) ×2 IMPLANT
CANISTER WOUNDNEG PRESSURE 500 (CANNISTER) ×1 IMPLANT
CHLORAPREP W/TINT 26 (MISCELLANEOUS) ×2 IMPLANT
COVER SURGICAL LIGHT HANDLE (MISCELLANEOUS) ×2 IMPLANT
DRAPE DERMATAC (DRAPES) ×3 IMPLANT
DRAPE HALF SHEET 40X57 (DRAPES) ×2 IMPLANT
DRAPE INCISE IOBAN 66X45 STRL (DRAPES) ×1 IMPLANT
DRAPE ORTHO SPLIT 77X108 STRL (DRAPES) ×2
DRAPE SURG ORHT 6 SPLT 77X108 (DRAPES) ×2 IMPLANT
DRESSING PREVENA PLUS CUSTOM (GAUZE/BANDAGES/DRESSINGS) IMPLANT
DRSG PREVENA PLUS CUSTOM (GAUZE/BANDAGES/DRESSINGS) ×2
ELECT CAUTERY BLADE 6.4 (BLADE) ×2 IMPLANT
ELECT REM PT RETURN 9FT ADLT (ELECTROSURGICAL) ×2
ELECTRODE REM PT RTRN 9FT ADLT (ELECTROSURGICAL) ×1 IMPLANT
GAUZE XEROFORM 5X9 LF (GAUZE/BANDAGES/DRESSINGS) ×2 IMPLANT
GLOVE SURG SS PI 8.0 STRL IVOR (GLOVE) ×2 IMPLANT
GOWN STRL REUS W/ TWL LRG LVL3 (GOWN DISPOSABLE) ×2 IMPLANT
GOWN STRL REUS W/ TWL XL LVL3 (GOWN DISPOSABLE) ×1 IMPLANT
GOWN STRL REUS W/TWL LRG LVL3 (GOWN DISPOSABLE) ×2
GOWN STRL REUS W/TWL XL LVL3 (GOWN DISPOSABLE) ×1
KIT BASIN OR (CUSTOM PROCEDURE TRAY) ×2 IMPLANT
KIT TURNOVER KIT B (KITS) ×2 IMPLANT
NS IRRIG 1000ML POUR BTL (IV SOLUTION) ×2 IMPLANT
PACK GENERAL/GYN (CUSTOM PROCEDURE TRAY) ×2 IMPLANT
PAD ARMBOARD 7.5X6 YLW CONV (MISCELLANEOUS) ×4 IMPLANT
PAD NEG PRESSURE SENSATRAC (MISCELLANEOUS) ×1 IMPLANT
PENCIL SMOKE EVACUATOR (MISCELLANEOUS) ×2 IMPLANT
PREVENA RESTOR ARTHOFORM 46X30 (CANNISTER) IMPLANT
STAPLER SKIN 35 REG (STAPLE) ×2 IMPLANT
STAPLER VISISTAT 35W (STAPLE) ×2 IMPLANT
STOCKINETTE IMPERVIOUS LG (DRAPES) ×2 IMPLANT
SUT ETHILON 2 0 PSLX (SUTURE) ×4 IMPLANT
SUT ETHILON 3 0 PS 1 (SUTURE) IMPLANT
SUT PROLENE 4 0 SH DA (SUTURE) ×1 IMPLANT
SUT SILK 2 0 (SUTURE) ×1
SUT SILK 2 0 SH CR/8 (SUTURE) ×4 IMPLANT
SUT SILK 2-0 18XBRD TIE 12 (SUTURE) ×1 IMPLANT
SUT SILK 3 0 (SUTURE) ×1
SUT SILK 3-0 18XBRD TIE 12 (SUTURE) IMPLANT
SUT VIC AB 3-0 SH 27 (SUTURE) ×2
SUT VIC AB 3-0 SH 27X BRD (SUTURE) IMPLANT
TAPE UMBILICAL COTTON 1/8X30 (MISCELLANEOUS) ×2 IMPLANT
TOWEL GREEN STERILE (TOWEL DISPOSABLE) ×4 IMPLANT
UNDERPAD 30X36 HEAVY ABSORB (UNDERPADS AND DIAPERS) ×2 IMPLANT
WATER STERILE IRR 1000ML POUR (IV SOLUTION) ×2 IMPLANT

## 2021-10-09 NOTE — Progress Notes (Signed)
[facility]  Date of Admission:  09/09/2021     Principal Problem:   Splenic infarction Active Problems:   Uncontrolled type 2 diabetes mellitus with hypoglycemia, with long-term current use of insulin (HCC)   Essential hypertension   ESRD (end stage renal disease) on dialysis (HCC)   Chronic respiratory failure with hypoxia (HCC)   Leukocytosis   Dry gangrene (HCC)   Pneumonia of left lower lobe due to infectious organism   Lactic acidosis   Mixed diabetic hyperlipidemia associated with type 2 diabetes mellitus (HCC)   Elevated troponin level not due myocardial infarction   Hypotension   PVD (peripheral vascular disease) (HCC)   Hx of AKA (above knee amputation), right (HCC)   Pressure injury of skin   Hepatic abscess          Assessment: 88 YF admitted with:  #Hepatic abscess status post drain placement-resolved #Subcapsular fluid collection/splenic infarcts-increase in size #Bacteroides bacteremia on 6/17(cleared on 6/23) 2/2 CLABSI vs  intraabdominal abscess #Persistent leukocytosis #Peripheral gangrene(left second toe) #ESRD on IHD via RIJ -RIJ placed on 6/7, failed avf -Patient had drain placed hepatic abscess(CT notes hepatic collection on 6/23), cultures negative. -CT on 7/3 showed complete resolution of hepatic collection/abscess.  Large bilobed subcapsular fluid collection in spleen measuring 10.7 cm small-to-moderate loculated left pleural effusion.  Perinephric stranding and fluid associated with both kidneys is increased - Etiology of back leukocytosis unclear although increased in size.  Of subcapsular fluid collection may be contributing(10->10.7cm).  Also has second toe gangrene, plan for BKA with Vascular today.  TEE showed no vegetation. Recommendations: -Engage IR as subcapsular collection drainage as it persists in the setting for persistent leukocytosis. I spoke with radiology today, splenic drains are avoided due to  risk of bleeding and need for splenectomy.  General Surgery on 6/26 recommended medical management at the time per splenic abscess.  -Continue cefepime and metronidazole -OR with vascular today  -Line removal (RIJ placed on 6/7) as pt continues to have leukocytosis  Microbiology:   Antibiotics: Cefepime and metronidazole  Cultures: Blood 6/25 1/2 bacteroides thetaiotaomicron  Other 6/17 hepatic abscess Cx NG 6/25 NG  SUBJECTIVE: Resting in bed. She reports she is upset because she is getting another drain placed.  Interval:wbc 21k. afebrile overnight.   Review of Systems: Review of Systems  All other systems reviewed and are negative.    Scheduled Meds:  amLODipine  10 mg Oral Daily   aspirin EC  81 mg Oral Daily   atorvastatin  10 mg Oral QHS   calcitRIOL  1 mcg Oral Q T,Th,Sa-HD   carvedilol  3.125 mg Oral BID WC   cinacalcet  30 mg Oral Q T,Th,Sa-HD   darbepoetin (ARANESP) injection - DIALYSIS  200 mcg Intravenous Q Sat-HD   diclofenac Sodium  2 g Topical QID   [START ON 10/10/2021] docusate sodium  100 mg Oral Daily   feeding supplement  1 Container Oral TID BM   gabapentin  100 mg Oral Q12H   gabapentin  200 mg Oral Q T,Th,Sa-HD   Gerhardt's butt cream   Topical BID   insulin aspart  1-3 Units Subcutaneous TID WC   metoCLOPramide (REGLAN) injection  5 mg Intravenous Q6H   multivitamin  1 tablet Oral QHS   pantoprazole  40 mg Oral BID   polyethylene glycol  17 g Oral Daily   sodium chloride flush  10-40 mL Intracatheter Q12H   sodium chloride  flush  3 mL Intravenous Q12H   sodium chloride flush  5 mL Intracatheter Q8H   Continuous Infusions:  sodium chloride 10 mL/hr at 10/09/21 1410   ceFEPime (MAXIPIME) IV 2 g (10/08/21 2114)   heparin     magnesium sulfate bolus IVPB     metronidazole 500 mg (10/09/21 2031)   PRN Meds:.acetaminophen **OR** acetaminophen, albuterol, alum & mag hydroxide-simeth, bisacodyl, diphenhydrAMINE, glucagon (human recombinant),  glucose, guaiFENesin-dextromethorphan, hydrALAZINE, HYDROmorphone (DILAUDID) injection, HYDROmorphone, labetalol, lactulose, lidocaine (PF), lidocaine-prilocaine, loperamide, magnesium sulfate bolus IVPB, naLOXone (NARCAN)  injection, nitroGLYCERIN, ondansetron **OR** ondansetron (ZOFRAN) IV, oxyCODONE, oxyCODONE, pentafluoroprop-tetrafluoroeth, phenol, polyethylene glycol, potassium chloride, prochlorperazine, senna-docusate Allergies  Allergen Reactions   Benzonatate Other (See Comments)    Hallucinations   Hydralazine Other (See Comments)    Hallucinations   Amoxicillin Itching and Nausea And Vomiting   Clonidine Other (See Comments)    Altered mental state, insomnia    Chlorhexidine Itching    Patient reports it is the CHG baths that cause her itching not the CHG used for caring for her IV/HD access.     Morphine Itching   Oxycodone Itching    Pt tolerated hospital admission 07/2021    OBJECTIVE: Vitals:   10/09/21 1530 10/09/21 1545 10/09/21 1615 10/09/21 2010  BP: (!) 149/79 (!) 144/56 (!) 131/57   Pulse: 85 81 78   Resp: 19 18 13    Temp: 98.8 F (37.1 C)     TempSrc:      SpO2: 93% 94% 94% 96%  Weight:      Height:       Body mass index is 56.39 kg/m.  Physical Exam Constitutional:      Appearance: Normal appearance.  HENT:     Head: Normocephalic and atraumatic.     Right Ear: Tympanic membrane normal.     Left Ear: Tympanic membrane normal.     Nose: Nose normal.     Mouth/Throat:     Mouth: Mucous membranes are moist.  Eyes:     Extraocular Movements: Extraocular movements intact.     Conjunctiva/sclera: Conjunctivae normal.     Pupils: Pupils are equal, round, and reactive to light.  Cardiovascular:     Rate and Rhythm: Normal rate and regular rhythm.     Heart sounds: No murmur heard.    No friction rub. No gallop.  Pulmonary:     Effort: Pulmonary effort is normal.     Breath sounds: Normal breath sounds.  Abdominal:     General: Abdomen is flat.      Palpations: Abdomen is soft.  Musculoskeletal:        General: Normal range of motion.     Comments: Left middle finger wound Left toe wound  Skin:    General: Skin is warm and dry.  Neurological:     General: No focal deficit present.     Mental Status: She is alert and oriented to person, place, and time.  Psychiatric:        Mood and Affect: Mood normal.       Lab Results Lab Results  Component Value Date   WBC 21.3 (H) 10/09/2021   HGB 9.5 (L) 10/09/2021   HCT 28.0 (L) 10/09/2021   MCV 99.6 10/09/2021   PLT 307 10/09/2021    Lab Results  Component Value Date   CREATININE 5.50 (H) 10/09/2021   BUN 20 10/09/2021   NA 131 (L) 10/09/2021   K 3.7 10/09/2021   CL 95 (L) 10/09/2021  CO2 25 10/09/2021    Lab Results  Component Value Date   ALT 10 09/14/2021   AST 16 09/14/2021   ALKPHOS 124 09/14/2021   BILITOT 0.7 09/14/2021        Laurice Record, [facility] 10/09/2021, 10:07 PM

## 2021-10-09 NOTE — Care Management Important Message (Signed)
Important Message  Patient Details  Name: Courtney Gray MRN: [id number] Date of Birth: [birth year]   Medicare Important Message Given:  Yes     Shelda Altes 10/09/2021, 10:15 AM

## 2021-10-09 NOTE — Progress Notes (Signed)
Nutrition Follow-up  DOCUMENTATION CODES:  Not applicable  INTERVENTION:  - Continue Boost Breeze po TID, each supplement provides 250 kcal and 9 grams of protein - Resume Regular diet with 1200 ml fluid restriction to promote PO intake after surgery - Renal MVI daily - Pt would benefit from enteral feeds via cortrak as she is unable to take in sufficient PO to support recovery and wound healing. - Discontinue Nepro Shake as pt is refusing all of them  NUTRITION DIAGNOSIS:  Inadequate oral intake related to nausea as evidenced by per patient/family report. Ongoing  GOAL:  Patient will meet greater than or equal to 90% of their needs Unmet  MONITOR:  PO intake, Supplement acceptance, Labs, Weight trends, Skin, I & O's  REASON FOR ASSESSMENT:  Consult Enteral/tube feeding initiation and management (advance tube feeds to goal)  ASSESSMENT:  51 year old female who presented to the ED on 6/06 with abdominal pain, N/V, constipation. PMH of ESRD on HD, gangrene of RLE s/p recent R AKA, CHF, CAD, T2DM, chronic hypoxic respiratory failure on home oxygen, HLD, HTN, gout, severe PAD. Pt admitted with splenic infarction, PNA of LLL, shock.  5/10 - (prior to admission) Right AKA 06/06 - NG tube placed 06/08 - NG tube removed during CT scan, replaced by nursing 06/09 - NG tube removed, Cortrak placement attempted but unsuccessful due to pt refusal 6/19 - left middle finger amputation 6/23 - TEE 6/25 - CT guided drainage of hepatic abscess   Pt out of room at the time of assessment for left AKA. Reviewed intake, limited meals recorded since last visit but average consumption increased. Pt has refused all Nepro supplements, intermittently accepts boost breeze.   Pt will need adequate nutrition to recover from illness and support wound healing. If intake is not improved from what her typical intake has been, would recommend provider speak with pt about placing an NGT or PEG to provide adequate  nutrition to facilitate recovery.  EDW: 151 kg Admit weight: 150.6 kg Current weight: 153.7 kg Net IO Since Admission: 27,098.95 mL [10/09/21 1020]  Average Meal Intake: 6/13: 25% intake x 1 recorded meals 6/14-6/21: 88% intake x 2 recorded meals 6/22-6/28: 69% intake x 4 recorded meals 6/29-7/5: 92% intake x 3 recorded meals  Nutritionally Relevant Medications: Scheduled Meds:  atorvastatin  10 mg Oral QHS   calcitRIOL  1 mcg Oral Q T,Th,Sa-HD   cinacalcet  30 mg Oral Q T,Th,Sa-HD   Boost Breeze  1 Container Oral TID BM   insulin aspart  1-3 Units Subcutaneous TID WC   REGLAN  5 mg Intravenous Q6H   multivitamin  1 tablet Oral QHS   pantoprazole  40 mg Oral BID   polyethylene glycol  17 g Oral Daily   Continuous Infusions:  ceFEPime (MAXIPIME) IV 2 g (10/08/21 2114)   metronidazole 500 mg (10/08/21 2156)   PRN Meds: lactulose, ondansetron, prochlorperazine, senna-docusate  Labs Reviewed: Na 132, chloride 94 BUN 18, creatinine 4.44  NUTRITION - FOCUSED PHYSICAL EXAM: Flowsheet Row Most Recent Value  Orbital Region No depletion  Upper Arm Region No depletion  Thoracic and Lumbar Region No depletion  Buccal Region No depletion  Temple Region No depletion  Clavicle Bone Region No depletion  Clavicle and Acromion Bone Region No depletion  Scapular Bone Region No depletion  Dorsal Hand No depletion  Patellar Region No depletion  Anterior Thigh Region No depletion  Posterior Calf Region No depletion  Edema (RD Assessment) Moderate  [BUE, BLE]  Hair  Reviewed  Eyes Reviewed  Mouth Reviewed  Skin Reviewed  Nails Reviewed   Diet Order:   Diet Order             Diet NPO time specified  Diet effective midnight                   EDUCATION NEEDS:  Education needs have been addressed  Skin:  Skin Assessment: Skin Integrity Issues: Stage II: bilateral buttocks Incisions: RLE s/p AKA Other: left middle finger amputation  Last BM:  6/25  Height:  Ht  Readings from Last 1 Encounters:  10/02/21 5\' 5"  (1.651 m)    Weight:  Wt Readings from Last 1 Encounters:  10/08/21 (!) 153.7 kg    Ideal Body Weight:  52.3 kg (adjusted for R AKA)  BMI:  Body mass index is 56.39 kg/m.  Estimated Nutritional Needs:  Kcal:  1800-2000 Protein:  90-110 grams Fluid:  1000 ml + UOP   Ranell Patrick, RD, LDN Clinical Dietitian RD pager # available in AMION  After hours/weekend pager # available in [facility]

## 2021-10-09 NOTE — Anesthesia Preprocedure Evaluation (Addendum)
Anesthesia Evaluation  Patient identified by MRN, date of birth, ID band Patient awake    Reviewed: Allergy & Precautions, NPO status , Patient's Chart, lab work & pertinent test results  Airway Mallampati: II  TM Distance: >3 FB Neck ROM: Full    Dental no notable dental hx.    Pulmonary neg pulmonary ROS,    Pulmonary exam normal        Cardiovascular hypertension, Pt. on medications and Pt. on home beta blockers + CAD, + Past MI and + Peripheral Vascular Disease  Normal cardiovascular exam     Neuro/Psych negative neurological ROS  negative psych ROS   GI/Hepatic negative GI ROS, Neg liver ROS,   Endo/Other  diabetes, Oral Hypoglycemic Agents, Insulin DependentMorbid obesity  Renal/GU ESRF and DialysisRenal disease     Musculoskeletal  (+) Arthritis ,   Abdominal (+) + obese,   Peds  Hematology  (+) Blood dyscrasia, anemia ,   Anesthesia Other Findings Gangrene  Reproductive/Obstetrics                            Anesthesia Physical Anesthesia Plan  ASA: 4  Anesthesia Plan: General   Post-op Pain Management:    Induction: Intravenous  PONV Risk Score and Plan: 3 and Ondansetron, Dexamethasone and Treatment may vary due to age or medical condition  Airway Management Planned: Oral ETT  Additional Equipment:   Intra-op Plan:   Post-operative Plan: Extubation in OR  Informed Consent: I have reviewed the patients History and Physical, chart, labs and discussed the procedure including the risks, benefits and alternatives for the proposed anesthesia with the patient or authorized representative who has indicated his/her understanding and acceptance.     Dental advisory given  Plan Discussed with: CRNA  Anesthesia Plan Comments:        Anesthesia Quick Evaluation

## 2021-10-09 NOTE — Progress Notes (Signed)
[facility]                                                                              Courtney Gray, is a 51 y.o. female, DOB - 11/20/1970, WUJ:[id number] Admit date - 09/09/2021    Outpatient Primary MD for the patient is Monico Blitz, MD  LOS - 29  days  Chief Complaint  Patient presents with   Nausea   Constipation       Brief summary   Courtney Gray is a 51 y.o. 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 She was hypoglycemic, febrile with CT revealing hypoenhancement in the spleen consistent with infarct. Started on IV heparin, empiric antibiotics for septic shock of unclear origin. Later taken for left middle finger amputation 6/19, Ultimately was taken for TEE 6/23 which revealed PFO without vegetation and required transfer to ICU for septic shock found to have hepatic and splenic abscesses. ID has guided IV antibiotics, IR placed hepatic abscess drain 6/25.    Assessment & Plan   Septic shock Bacteremia bacteroids thetaiotaomicron-beta-lactamase positive on 6/17: -Drain management per IR -Continue IV cefepime with dialysis and oral metronidazole -ID following  Hepatic and splenic abscess -Status post CT-guided drainage of liver abscess by IR on 6/25 with purulent fluid --CT abdomen pelvis repeated on 7/3 show near completion of previously seen hepatic fluid abscess. Large bilobed subcapsular fluid collection 10.7 cm still persisting.  Small volume perihepatic ascites, small to moderate size loculated left pleural effusion -Continue IV cefepime, Flagyl -IR reconsulted as a subcapsular collection drainage persisting and leukocytosis.  ID requesting surgery to reevaluate  -plan per IR/gen surg/ID  Splenic infarct -No embolic source noted on  echo or CT. -TEE on 6/23 showed normal LV function, negative for endocarditis.  Calcified mass in the right atrium-etiology unclear, possibly calcified thrombus. -eliquis was placed on hold due to subcapsular hematoma 5.9x10x8.9, continue IV heparin -Continue to hold Eliquis  Left third digit ischemic gangrene Severe PAD with prior right AKA New left first and second toe gangrene - amputation of left third digit on 6/19 by hand surgery --Appreciate vascular surgery reevaluation, recommended left AKA, plan on 10/09/2021, -Continue pain control, aspirin, statin  Diabetes mellitus type 2, insulin-dependent, uncontrolled with hypoglycemia -Hemoglobin A1c 5.7% -CBGs controlled, continue current regimen Recent Labs    10/06/21 2107 10/07/21 1624 10/08/21 2116  GLUCAP 223* 154* 176*    HTN  Initially was hypotensive, requiring pressors, midodrine -currently bp stable on amlodipine, low-dose Coreg -with brief hypotension  likely sedation related, monitor   Acute on chronic respiratory failure with hypoxia Suspected OHS, OSA -Continue O2, 2 to 3 L via [place], at baseline -will benefit from outpatient sleep study   ESRD on hemodialysis, TTS -HD per nephrology   Anemia, multifactorial -Likely due to CKD, also had menorrhagia, now resolved -Status post 2 unit packed RBCs 5/23, 6/22 with HD -Continue Aranesp.   -Hemoglobin 7.1, follow  H&H in a.m., transfuse if less than 7,  Menorrhagia -Resolved, Megace discontinued  Demand ischemia with elevated troponin in the setting of anemia, sepsis -ACS ruled out.  Currently no chest pain.    Hyperlipidemia  -Continue statin   GERD  -continue PPI  Acute on chronic ambulatory dysfunction -Continue PT OT, recommended SNF    Pressure ulcer stage II left and right buttock POA -Continue local wound care.  Change position every 2 hours  Morbid obesity Estimated body mass index is 56.39 kg/m as calculated from the following:   Height as of  this encounter: 5\' 5"  (1.651 m).   Weight as of this encounter: 153.7 kg.  OSA? Reports Have not done sleep study , wearing oxygen at home  FTT: Frequent hospitalizations, 6th admissions this year  Code Status: Full code DVT Prophylaxis:    Currently on heparin drip  Level of Care: Level of care: Med-Surg Family Communication: Updated patient's daughter at the bedside on 10/06/2021.  Requested SNF when patient is medically ready   Disposition Plan:      Remains inpatient appropriate: On IV antibiotics, work-up in process  Consultants:   Nephrology Infectious disease Interventional radiology General surgery Vascular surgery Hand surgery Critical care Wound care  Antimicrobials:   Anti-infectives (From admission, onward)    Start     Dose/Rate Route Frequency Ordered Stop   09/25/21 0915  metroNIDAZOLE (FLAGYL) IVPB 500 mg        500 mg 100 mL/hr over 60 Minutes Intravenous Every 12 hours 09/25/21 0822     09/13/21 1000  vancomycin (VANCOCIN) IVPB 1000 mg/200 mL premix        1,000 mg 200 mL/hr over 60 Minutes Intravenous STAT 09/13/21 0904 09/13/21 1140   09/10/21 1200  vancomycin (VANCOCIN) IVPB 1000 mg/200 mL premix  Status:  Discontinued        1,000 mg 200 mL/hr over 60 Minutes Intravenous Every T-Th-Sa (Hemodialysis) 09/10/21 0620 09/17/21 1013   09/10/21 1200  ceFEPIme (MAXIPIME) 2 g in sodium chloride 0.9 % 100 mL IVPB        2 g 200 mL/hr over 30 Minutes Intravenous Every T-Th-Sa (Hemodialysis) 09/10/21 1107     09/10/21 0445  vancomycin (VANCOREADY) IVPB 2000 mg/400 mL        2,000 mg 200 mL/hr over 120 Minutes Intravenous  Once 09/10/21 0435 09/10/21 0800   09/10/21 0430  ceFEPIme (MAXIPIME) 2 g in sodium chloride 0.9 % 100 mL IVPB        2 g 200 mL/hr over 30 Minutes Intravenous  Once 09/10/21 0420 09/10/21 0550          Medications  amLODipine  10 mg Oral Daily   aspirin EC  81 mg Oral Daily   atorvastatin  10 mg Oral QHS   calcitRIOL  1 mcg Oral Q  T,Th,Sa-HD   carvedilol  3.125 mg Oral BID WC   cinacalcet  30 mg Oral Q T,Th,Sa-HD   darbepoetin (ARANESP) injection - DIALYSIS  200 mcg Intravenous Q Sat-HD   diclofenac Sodium  2 g Topical QID   feeding supplement  1 Container Oral TID BM   feeding supplement (NEPRO CARB STEADY)  237 mL Oral BID BM   gabapentin  100 mg Oral Q12H   gabapentin  200 mg Oral Q T,Th,Sa-HD   Gerhardt's butt cream   Topical BID   insulin aspart  1-3 Units Subcutaneous TID WC   metoCLOPramide (REGLAN) injection  5 mg Intravenous Q6H  multivitamin  1 tablet Oral QHS   pantoprazole  40 mg Oral BID   polyethylene glycol  17 g Oral Daily   sodium chloride flush  10-40 mL Intracatheter Q12H   sodium chloride flush  3 mL Intravenous Q12H   sodium chloride flush  5 mL Intracatheter Q8H      Subjective:   Courtney Gray was seen and examined.   Getting prn analgesics for toe pain, she is sleepy but oriented x3, able to answer questions She is npo awaiting for left AKA  She received enema due to constipation ( no bm for 10 day), not she request Imodium due to frequent stooling   Bp low normal x1, RN recheck bp which is fine, RN think the first reading was not accurate  She is on 4liter oxygen , which is her baseline     Objective:   Vitals:   10/08/21 1033 10/08/21 1306 10/08/21 2100 10/09/21 0529  BP: (!) 151/68 135/74 (!) 129/51 (!) 84/47  Pulse: 88 84 90   Resp: 18 17 20 18   Temp: 97.7 F (36.5 C) 97.9 F (36.6 C) 98.1 F (36.7 C) 99.3 F (37.4 C)  TempSrc: Oral Oral Oral Oral  SpO2: 100% 100% 93%   Weight: (!) 153.7 kg     Height:       No intake or output data in the 24 hours ending 10/09/21 0923    Wt Readings from Last 3 Encounters:  10/08/21 (!) 153.7 kg  09/03/21 (!) 150.6 kg  08/28/21 (!) 150.1 kg   Physical Exam General: Alert and oriented x 3, NAD Cardiovascular: S1 S2 clear, RRR.  Respiratory: Diminished breath sound at the bases Gastrointestinal: Soft, nontender,   NBS, drain+ Ext: right AKA, left second toe dark Neuro: no new deficits Skin: Left first and second toe gangrene Psych: Normal affect and demeanor, alert and oriented x3     Data Reviewed:  I have personally reviewed following labs    CBC Lab Results  Component Value Date   WBC 21.3 (H) 10/09/2021   RBC 2.57 (L) 10/09/2021   HGB 7.3 (L) 10/09/2021   HCT 25.6 (L) 10/09/2021   MCV 99.6 10/09/2021   MCH 28.4 10/09/2021   PLT 307 10/09/2021   MCHC 28.5 (L) 10/09/2021   RDW 21.2 (H) 10/09/2021   LYMPHSABS 1.0 09/28/2021   MONOABS 1.3 (H) 09/28/2021   EOSABS 0.0 09/28/2021   BASOSABS 0.0 03/47/4259     Last metabolic panel Lab Results  Component Value Date   NA 132 (L) 10/09/2021   K 3.6 10/09/2021   CL 94 (L) 10/09/2021   CO2 25 10/09/2021   BUN 18 10/09/2021   CREATININE 4.44 (H) 10/09/2021   GLUCOSE 127 (H) 10/09/2021   GFRNONAA 11 (L) 10/09/2021   GFRAA 6 (L) 11/29/2019   CALCIUM 8.3 (L) 10/09/2021   PHOS 3.7 10/09/2021   PROT 6.0 (L) 09/14/2021   ALBUMIN 1.9 (L) 10/09/2021   BILITOT 0.7 09/14/2021   ALKPHOS 124 09/14/2021   AST 16 09/14/2021   ALT 10 09/14/2021   ANIONGAP 13 10/09/2021    CBG (last 3)  Recent Labs    10/06/21 2107 10/07/21 1624 10/08/21 2116  GLUCAP 223* 154* 176*       Florencia Reasons M.D. PhD FACP [facility] 10/09/2021, 9:23 AM  Available via Epic secure chat 7am-7pm After 7 pm, please refer to night coverage provider listed on amion.

## 2021-10-09 NOTE — Progress Notes (Signed)
[facility] Progress Note   Subjective:  Seen in room - pain stable. Listless today, flat affect. Looks like going for L BKA today. Dialyzed yesterday without any issues - 3L removed.  Objective Vitals:   10/08/21 1033 10/08/21 1306 10/08/21 2100 10/09/21 0529  BP: (!) 151/68 135/74 (!) 129/51 (!) 84/47  Pulse: 88 84 90   Resp: 18 17 20 18   Temp: 97.7 F (36.5 C) 97.9 F (36.6 C) 98.1 F (36.7 C) 99.3 F (37.4 C)  TempSrc: Oral Oral Oral Oral  SpO2: 100% 100% 93%   Weight: (!) 153.7 kg     Height:       Physical Exam General: Obese woman, NAD. Nasal O2 in place. 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, L hand bandaged Dialysis Access: R [facility]  Additional Objective Labs: Basic Metabolic Panel: Recent Labs  Lab 10/07/21 0520 10/08/21 0506 10/09/21 0050  NA 132* 131* 132*  K 3.9 4.0 3.6  CL 95* 94* 94*  CO2 27 25 25   GLUCOSE 77 86 127*  BUN 20 26* 18  CREATININE 5.11* 6.14* 4.44*  CALCIUM 8.1* 8.4* 8.3*  PHOS 3.8 4.4 3.7   Liver Function Tests: Recent Labs  Lab 10/07/21 0520 10/08/21 0506 10/09/21 0050  ALBUMIN 1.8* 1.9* 1.9*   CBC: Recent Labs  Lab 10/05/21 0320 10/06/21 0323 10/07/21 0520 10/08/21 0506 10/09/21 0050  WBC 22.0* 21.4* 19.7* 20.7* 21.3*  HGB 7.4* 7.2* 7.2* 7.1* 7.3*  HCT 25.1* 25.7* 24.6* 23.9* 25.6*  MCV 97.7 99.2 98.4 96.8 99.6  PLT 292 310 298 306 307   Blood Culture    Component Value Date/Time   SDES ABSCESS 09/29/2021 1106   SPECREQUEST HEPATIC ABSCESS 09/29/2021 1106   CULT  09/29/2021 1106    No growth aerobically or anaerobically. Performed at [facility], [place] [address], [place], [place] [address]    REPTSTATUS 10/04/2021 FINAL 09/29/2021 1106   Studies/Results: CT ABDOMEN PELVIS W CONTRAST  Result Date: 10/07/2021 CLINICAL DATA:  Abdominal pain, post-op Admitted with hepatic and splenic abscess,  abdominal drain, splenic infarct, bacteremia, persistent leukocytosis, assess for interval improvement. Also has capsular hematoma noted on previous CT, assess for interval resolution EXAM: CT ABDOMEN AND PELVIS WITH CONTRAST TECHNIQUE: Multidetector CT imaging of the abdomen and pelvis was performed using the standard protocol following bolus administration of intravenous contrast. RADIATION DOSE REDUCTION: This exam was performed according to the departmental dose-optimization program which includes automated exposure control, adjustment of the mA and/or kV according to patient size and/or use of iterative reconstruction technique. CONTRAST:  161mL OMNIPAQUE IOHEXOL 300 MG/ML  SOLN COMPARISON:  09/27/2021 FINDINGS: Lower chest: Small-moderate-sized loculated left pleural effusion with associated compressive atelectasis. Cardiomegaly with coronary artery atherosclerosis. No pericardial effusion. Hepatobiliary: Percutaneous pigtail drainage catheter within the left hepatic lobe with near complete resolution of previously seen hepatic fluid collection/abscess. Minimal fluid attenuation surrounds the pigtail. No new intrahepatic collection. Unremarkable gallbladder. Pancreas: Unremarkable. No pancreatic ductal dilatation or surrounding inflammatory changes. Spleen: Large bilobed subcapsular fluid collection within the spleen measures approximately 10.1 x 6.6 x 10.7 cm (previously measured approximately 10.0 x 5.9 x 8.9 cm). Adrenals/Urinary Tract: Adrenal glands within normal limits. Severe bilateral renal atrophy. No renal stone or hydronephrosis. Perinephric stranding and fluid associated with both kidneys is increased from prior. No organized perinephric fluid collection. Urinary bladder is decompressed. Stomach/Bowel: Stomach is within normal limits. Appendix  appears normal. No evidence of bowel wall thickening, distention, or inflammatory changes. Vascular/Lymphatic: Aortic atherosclerosis. No enlarged abdominal  or pelvic lymph nodes. Reproductive: Uterus and bilateral adnexa are unremarkable. Other: No pneumoperitoneum. Small volume perihepatic ascites, slightly increased. Musculoskeletal: No new or acute bony findings. IMPRESSION: 1. Percutaneous pigtail drainage catheter within the left hepatic lobe with near complete resolution of previously seen hepatic fluid collection/abscess. 2. Large bilobed subcapsular fluid collection within the spleen measures up to 10.7 cm (previously measured up to 10.0 cm). 3. Small volume perihepatic ascites, slightly increased from prior. 4. Small-moderate-sized loculated left pleural effusion with associated compressive atelectasis. 5. Severe bilateral renal atrophy. Perinephric stranding and fluid associated with both kidneys is increased from prior. No organized perinephric fluid collection. Aortic Atherosclerosis (ICD10-I70.0). Electronically Signed   By: Davina Poke D.O.   On: 10/07/2021 12:11    Medications:  sodium chloride 10 mL/hr at 10/08/21 0100   ceFEPime (MAXIPIME) IV 2 g (10/08/21 2114)   heparin 2,150 Units/hr (10/08/21 2156)   metronidazole 500 mg (10/08/21 2156)    amLODipine  10 mg Oral Daily   aspirin EC  81 mg Oral Daily   atorvastatin  10 mg Oral QHS   calcitRIOL  1 mcg Oral Q T,Th,Sa-HD   carvedilol  3.125 mg Oral BID WC   cinacalcet  30 mg Oral Q T,Th,Sa-HD   darbepoetin (ARANESP) injection - DIALYSIS  200 mcg Intravenous Q Sat-HD   diclofenac Sodium  2 g Topical QID   feeding supplement  1 Container Oral TID BM   feeding supplement (NEPRO CARB STEADY)  237 mL Oral BID BM   gabapentin  100 mg Oral Q12H   gabapentin  200 mg Oral Q T,Th,Sa-HD   Gerhardt's butt cream   Topical BID   insulin aspart  1-3 Units Subcutaneous TID WC   metoCLOPramide (REGLAN) injection  5 mg Intravenous Q6H   multivitamin  1 tablet Oral QHS   pantoprazole  40 mg Oral BID   polyethylene glycol  17 g Oral Daily   sodium chloride flush  10-40 mL Intracatheter Q12H    sodium chloride flush  3 mL Intravenous Q12H   sodium chloride flush  5 mL Intracatheter Q8H    Dialysis Orders: [name] TTS 4h 99min, BFR 400, EDW 151 kg, 1K/2.5Ca bath, TDC Hep 2,600 load and 1,500 hourly-Total dose 9275 units per treatment.  - mircera 200ug q2 due 09/10/21 - calcitriol 1 ug tiw - sensipar 30 mg tiw   Assessment/Plan: Septic shock: On admit requiring short-term pressors and again after TEE. Found to have bacteremia with bacteroides thetaiotaomicron and liver abscess and concern for splenic abscess. Appreciate ID - for now they do not feel that we need to remove the tunneled catheter. Remains on course of Cefepime with HD and oral metronidazole.  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 For L BKA today (10/09/21). Splenic Infarct/abdominal pain: Remains on heparin drip. Splenic/Hepatic abscesses: S/p drain placed in IR 09/29/2021, per ID note this has resolved. ESRD: Continue HD per usual TTS schedule - next tomorrow. Pt has requested 4 hour treatments instead of 4 hr 76min. Advised we can try 4 hour treatments as long as labs are acceptable and we are removing adequate amounts of fluid. No heparin with HD, on heparin drip for now. Chronic hypoxic resp failure - At baseline, she is on 3L O2 at home. HTN/volume:  Optimize UF with HD. She has now reached her old EDW but need to  lower further. CXR repeated still shows vascular congestion.  Anemia of ESRD: Hgb 7.3. Continue Aranesp 200 mcg q Saturday. Transfused 1 unit of PRBCs with HD 09/26/2021.     Secondary Hyperparathyroidism: CorrCa/Phos ok, requesting to hold binder due to constipation but resume if phos trends back up. Continue VDRA + sensipar.   T2DM- per primary team Malnutrition in setting of Morbid Obesity - Albumin very low. Continue protein supplements.   [name], PA-C 10/09/2021, 9:26 AM  [employer]

## 2021-10-09 NOTE — Progress Notes (Signed)
ANTICOAGULATION CONSULT NOTE  Pharmacy Consult for heparin Indication:  splenic infarct  Heparin Dosing Weight: 95 kg  Labs: Recent Labs    10/07/21 0520 10/08/21 0506 10/08/21 1600 10/09/21 0050  HGB 7.2* 7.1*  --  7.3*  HCT 24.6* 23.9*  --  25.6*  PLT 298 306  --  307  HEPARINUNFRC 0.36 0.52 0.28* 0.30  CREATININE 5.11* 6.14*  --  4.44*     Assessment: 63 yof with hx PVD s/p R AKA 5/10 for critical limb ischemia, ESRD on HD TTS, found to have splenic infarct and transitioned from heparin infusion to apixaban on 6/14. Pharmacy consulted to transition back to heparin infusion with patient hypotensive requiring transfer to ICU s/p TEE on 6/23. Last dose of apixaban 6/23 at 1048. She is s/p IR drain placement for hepatic abscess and 6/23 noted CT finding of possible subcapsular hematoma so heparin goal was lowered.  Heparin level is therapeutic at 0.3, on 2150 units/hr. Hgb low but stable at 7.3, plt 307. No s/sx of bleeding or infusion issues.  Goal of Therapy:  Heparin level 0.3-0.5 units/ml Monitor platelets by anticoagulation protocol: Yes   Plan:  Continue heparin infusion at 2150 units/hr  Monitor daily heparin level, CBC Monitor for signs/symptoms of bleeding   Antonietta Jewel, PharmD, [place] Pharmacist  Phone: [phone] 10/09/2021 7:33 AM  Please check AMION for all [place] phone numbers After 10:00 PM, call [place] [phone]

## 2021-10-09 NOTE — Anesthesia Procedure Notes (Addendum)
Procedure Name: Intubation Date/Time: 10/09/2021 2:26 PM  Performed by: Moshe Salisbury, CRNAPre-anesthesia Checklist: Patient identified, Emergency Drugs available, Suction available and Patient being monitored Patient Re-evaluated:Patient Re-evaluated prior to induction Oxygen Delivery Method: Circle System Utilized Preoxygenation: Pre-oxygenation with 100% oxygen Induction Type: IV induction Ventilation: Mask ventilation without difficulty Laryngoscope Size: Mac and 3 Grade View: Grade I Tube type: Oral Tube size: 7.0 mm Number of attempts: 1 Airway Equipment and Method: Stylet and Oral airway Placement Confirmation: ETT inserted through vocal cords under direct vision, positive ETCO2 and breath sounds checked- equal and bilateral Secured at: 22 cm Tube secured with: Tape Dental Injury: Teeth and Oropharynx as per pre-operative assessment

## 2021-10-09 NOTE — Consult Note (Signed)
Chief Complaint: Splenic abscess  Referring Physician(s): Tyler Pita  Supervising Physician: Michaelle Birks  Patient Status: [facility] - In-pt  History of Present Illness: Courtney Gray is a 51 y.o. female who is known to our service.  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  She presented to the ED on 6/5 due to severe LUQ abdominal pain.  CT showed hypoenhancement in the spleen consistent with infarct.   She was started on IV heparin, empiric antibiotics for septic shock of unclear origin.   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  TEE done 6/23 showed PFO without vegetation.  She was transferred to ICU for septic shock found to have hepatic and splenic abscesses.   ID has guided IV antibiotics.  She underwent hepatic abscess drain by Dr. Kathlene Cote on 6/25.   She is here today for drainage of her splenic abscess.  She is NPO.  Past Medical History:  Diagnosis Date   Arthritis    Diabetes mellitus without complication ([place])    Dialysis complication, subsequent encounter    Gout    High cholesterol    Hypertension    Renal disorder     Past Surgical History:  Procedure Laterality Date   ABDOMINAL AORTOGRAM W/LOWER EXTREMITY N/A 08/07/2021   Procedure: ABDOMINAL AORTOGRAM W/LOWER EXTREMITY;  Surgeon: Broadus John, MD;  Location: [place] CV LAB;  Service: Cardiovascular;  Laterality: N/A;   AMPUTATION Right 08/14/2021   Procedure: AMPUTATION ABOVE KNEE;  Surgeon: Marty Heck, MD;  Location: [place];  Service: Vascular;  Laterality: Right;   AMPUTATION Left 09/23/2021   Procedure: AMPUTATION MIDDLE FINGER;  Surgeon: Sherilyn Cooter, MD;  Location: [place];  Service: Orthopedics;  Laterality: Left;   AV FISTULA PLACEMENT     x2, unsuccessful.    BUBBLE STUDY  09/27/2021   Procedure:  BUBBLE STUDY;  Surgeon: Lelon Perla, MD;  Location: [facility] ENDOSCOPY;  Service: Cardiovascular;;   IR FLUORO GUIDE CV LINE LEFT  12/14/2020   IR FLUORO GUIDE CV LINE LEFT  02/20/2021   IR FLUORO GUIDE CV LINE LEFT  09/11/2021   IR FLUORO GUIDE CV LINE RIGHT  08/28/2020   IR FLUORO GUIDE CV LINE RIGHT  05/13/2021   IR FLUORO GUIDE CV LINE RIGHT  05/24/2021   IR PERC TUN PERIT CATH WO PORT S&I /IMAG  09/06/2020   IR PTA VENOUS EXCEPT DIALYSIS CIRCUIT  02/20/2021   IR PTA VENOUS EXCEPT DIALYSIS CIRCUIT  05/13/2021   IR REMOVAL TUN CV CATH W/O FL  05/24/2021   IR US GUIDE VASC ACCESS LEFT  09/06/2020   IR US GUIDE VASC ACCESS LEFT  09/11/2021   IR US GUIDE VASC ACCESS RIGHT  05/24/2021   IR US GUIDE VASC ACCESS RIGHT  09/10/2021   IR VENO/JUGULAR RIGHT  09/10/2021   IR VENOCAVAGRAM SVC  02/20/2021   TEE WITHOUT CARDIOVERSION N/A 09/27/2021   Procedure: TRANSESOPHAGEAL ECHOCARDIOGRAM (TEE);  Surgeon: Lelon Perla, MD;  Location: [facility] ENDOSCOPY;  Service: Cardiovascular;  Laterality: N/A;    Allergies: Benzonatate, Hydralazine, Amoxicillin, Clonidine, Chlorhexidine, Morphine, and Oxycodone  Medications: Prior to Admission medications   Medication Sig Start Date End Date Taking? Authorizing Provider  acetaminophen (TYLENOL) 500 MG tablet Take 1,000 mg by mouth as needed for moderate pain.   Yes [provider]  amLODipine (NORVASC)  10 MG tablet Take 1 tablet (10 mg total) by mouth daily. Patient taking differently: Take 10 mg by mouth daily as needed (Blood pressusre). 11/29/20  Yes Roxan Hockey, MD  aspirin EC 81 MG tablet Take 1 tablet (81 mg total) by mouth daily with breakfast. 11/28/20  Yes Emokpae, Courage, MD  atorvastatin (LIPITOR) 10 MG tablet Take 1 tablet (10 mg total) by mouth daily. Patient taking differently: Take 10 mg by mouth at bedtime. 11/28/20  Yes Emokpae, Courage, MD  calcium carbonate (TUMS SMOOTHIES) 750 MG chewable tablet Chew 1,500 mg by mouth See admin instructions. Take  1500 mg with each meal and 1500 mg with each snack   Yes [provider]  carvedilol (COREG) 6.25 MG tablet Take 1 tablet (6.25 mg total) by mouth 2 (two) times daily with a meal. Patient taking differently: Take 6.25 mg by mouth 2 (two) times daily as needed (if BP > 150). 08/28/21 09/27/21 Yes Pahwani, Einar Grad, MD  diphenhydrAMINE (BENADRYL) 25 MG tablet Take 25 mg by mouth 2 (two) times daily as needed for allergies.   Yes [provider]  docusate sodium (COLACE) 100 MG capsule Take 100 mg by mouth 2 (two) times daily.   Yes [provider]  gabapentin (NEURONTIN) 100 MG capsule Take 1 capsule (100 mg total) by mouth 2 (two) times daily. Patient taking differently: Take 100 mg by mouth See admin instructions. Take 100 mg by mouth twice a day on non dialysis days. On dialysis day take 100 mg in the morning, 200 mg after dialysis, and then 100 mg at bedtime. 08/28/21 09/27/21 Yes Pahwani, Einar Grad, MD  glipiZIDE (GLUCOTROL XL) 2.5 MG 24 hr tablet Take 1 tablet (2.5 mg total) by mouth daily with breakfast. 08/28/21 09/27/21 Yes Pahwani, Einar Grad, MD  HYDROcodone-acetaminophen (NORCO/VICODIN) 5-325 MG tablet Take 1-2 tablets by mouth every 4 (four) hours as needed for moderate pain. Patient taking differently: Take 1 tablet by mouth every 4 (four) hours as needed for moderate pain. 08/19/21  Yes Pahwani, Einar Grad, MD  insulin glargine (LANTUS) 100 UNIT/ML Solostar Pen Inject 12 Units into the skin daily.   Yes [provider]  loperamide (IMODIUM A-D) 2 MG tablet Take 4 mg by mouth as needed for diarrhea or loose stools.   Yes [provider]  nitroGLYCERIN (NITROSTAT) 0.4 MG SL tablet Place 1 tablet (0.4 mg total) under the tongue every 5 (five) minutes as needed for chest pain. 05/24/21  Yes [place] Eric J, DO  pantoprazole (PROTONIX) 40 MG tablet Take 1 tablet (40 mg total) by mouth daily. 11/29/20  Yes Roxan Hockey, MD  simethicone (MYLICON) 681 MG chewable tablet Chew 125  mg by mouth in the morning and at bedtime.   Yes [provider]  tiZANidine (ZANAFLEX) 4 MG capsule Take 4 mg by mouth in the morning and at bedtime.   Yes [provider]  doxycycline (VIBRAMYCIN) 100 MG capsule Take 100 mg by mouth 2 (two) times daily. Patient not taking: Reported on 09/10/2021 08/30/21   [provider]  lisinopril (ZESTRIL) 20 MG tablet Take 1 tablet (20 mg total) by mouth daily. Patient not taking: Reported on 09/10/2021 08/28/21 09/27/21  Darliss Cheney, MD  traMADol (ULTRAM) 50 MG tablet Take 1 tablet (50 mg total) by mouth every 6 (six) hours as needed for moderate pain. Patient not taking: Reported on 09/10/2021 08/28/21   Darliss Cheney, MD     Family History  Problem Relation Age of Onset   Diabetes Mother  Diabetes Father     Social History   Socioeconomic History   Marital status: Single    Spouse name: Not on file   Number of children: Not on file   Years of education: Not on file   Highest education level: Not on file  Occupational History   Not on file  Tobacco Use   Smoking status: Never   Smokeless tobacco: Never  Vaping Use   Vaping Use: Never used  Substance and Sexual Activity   Alcohol use: Never   Drug use: Never   Sexual activity: Not on file  Other Topics Concern   Not on file  Social History Narrative   Not on file   Social Determinants of Health   Financial Resource Strain: Not on file  Food Insecurity: Not on file  Transportation Needs: Not on file  Physical Activity: Not on file  Stress: Not on file  Social Connections: Not on file     Review of Systems: A 12 point ROS discussed and pertinent positives are indicated in the HPI above.  All other systems are negative.  Review of Systems  Vital Signs: BP (!) 144/77   Pulse 81   Temp 98.7 F (37.1 C) (Oral)   Resp 20   Ht 5' 5"  (1.651 m)   Wt (!) 338 lb 13.6 oz (153.7 kg)   SpO2 100%   BMI 56.39 kg/m   Physical Exam Constitutional:       Appearance: She is obese.  HENT:     Head: Normocephalic and atraumatic.  Cardiovascular:     Rate and Rhythm: Normal rate and regular rhythm.  Pulmonary:     Effort: Pulmonary effort is normal. No respiratory distress.     Breath sounds: Normal breath sounds.  Musculoskeletal:     Comments: Right AKA  Skin:    General: Skin is warm and dry.  Neurological:     General: No focal deficit present.     Mental Status: She is oriented to person, place, and time.  Psychiatric:        Mood and Affect: Mood normal.        Behavior: Behavior normal.        Thought Content: Thought content normal.        Judgment: Judgment normal.     Imaging: CT ABDOMEN PELVIS W CONTRAST  Result Date: 10/07/2021 CLINICAL DATA:  Abdominal pain, post-op Admitted with hepatic and splenic abscess, abdominal drain, splenic infarct, bacteremia, persistent leukocytosis, assess for interval improvement. Also has capsular hematoma noted on previous CT, assess for interval resolution EXAM: CT ABDOMEN AND PELVIS WITH CONTRAST TECHNIQUE: Multidetector CT imaging of the abdomen and pelvis was performed using the standard protocol following bolus administration of intravenous contrast. RADIATION DOSE REDUCTION: This exam was performed according to the departmental dose-optimization program which includes automated exposure control, adjustment of the mA and/or kV according to patient size and/or use of iterative reconstruction technique. CONTRAST:  196m OMNIPAQUE IOHEXOL 300 MG/ML  SOLN COMPARISON:  09/27/2021 FINDINGS: Lower chest: Small-moderate-sized loculated left pleural effusion with associated compressive atelectasis. Cardiomegaly with coronary artery atherosclerosis. No pericardial effusion. Hepatobiliary: Percutaneous pigtail drainage catheter within the left hepatic lobe with near complete resolution of previously seen hepatic fluid collection/abscess. Minimal fluid attenuation surrounds the pigtail. No new intrahepatic  collection. Unremarkable gallbladder. Pancreas: Unremarkable. No pancreatic ductal dilatation or surrounding inflammatory changes. Spleen: Large bilobed subcapsular fluid collection within the spleen measures approximately 10.1 x 6.6 x 10.7 cm (previously measured approximately  10.0 x 5.9 x 8.9 cm). Adrenals/Urinary Tract: Adrenal glands within normal limits. Severe bilateral renal atrophy. No renal stone or hydronephrosis. Perinephric stranding and fluid associated with both kidneys is increased from prior. No organized perinephric fluid collection. Urinary bladder is decompressed. Stomach/Bowel: Stomach is within normal limits. Appendix appears normal. No evidence of bowel wall thickening, distention, or inflammatory changes. Vascular/Lymphatic: Aortic atherosclerosis. No enlarged abdominal or pelvic lymph nodes. Reproductive: Uterus and bilateral adnexa are unremarkable. Other: No pneumoperitoneum. Small volume perihepatic ascites, slightly increased. Musculoskeletal: No new or acute bony findings. IMPRESSION: 1. Percutaneous pigtail drainage catheter within the left hepatic lobe with near complete resolution of previously seen hepatic fluid collection/abscess. 2. Large bilobed subcapsular fluid collection within the spleen measures up to 10.7 cm (previously measured up to 10.0 cm). 3. Small volume perihepatic ascites, slightly increased from prior. 4. Small-moderate-sized loculated left pleural effusion with associated compressive atelectasis. 5. Severe bilateral renal atrophy. Perinephric stranding and fluid associated with both kidneys is increased from prior. No organized perinephric fluid collection. Aortic Atherosclerosis (ICD10-I70.0). Electronically Signed   By: Davina Poke D.O.   On: 10/07/2021 12:11   DG CHEST PORT 1 VIEW  Result Date: 10/04/2021 CLINICAL DATA:  Pulmonary edema.  ESRD. EXAM: PORTABLE CHEST 1 VIEW COMPARISON:  September 15, 2021. FINDINGS: Moderate left pleural effusion. Overlying  left basilar opacities. No overt pulmonary edema. No visible pneumothorax. Enlarged cardiac silhouette. Pulmonary vascular congestion. Similar position of right and left IJ central venous catheters. IMPRESSION: 1. Moderate left pleural effusion with overlying left basilar atelectasis and/or consolidation. 2. Cardiomegaly and pulmonary vascular congestion. No overt pulmonary edema. Electronically Signed   By: Margaretha Sheffield M.D.   On: 10/04/2021 09:20   CT IMAGE GUIDED DRAINAGE BY PERCUTANEOUS CATHETER  Result Date: 09/29/2021 CLINICAL DATA:  Hepatic abscess. EXAM: CT GUIDED CATHETER DRAINAGE OF HEPATIC ABSCESS ANESTHESIA/SEDATION: Moderate (conscious) sedation was employed during this procedure. A total of Versed 1.5 mg and Fentanyl 100 mcg was administered intravenously by radiology nurse. Moderate Sedation Time: 10 minutes. The patient's level of consciousness and vital signs were monitored continuously by radiology nursing throughout the procedure under my direct supervision. PROCEDURE: The procedure, risks, benefits, and alternatives were explained to the patient. Questions regarding the procedure were encouraged and answered. The patient understands and consents to the procedure. A time out was performed prior to initiating the procedure. CT was performed through the abdomen in a supine position. The anterior abdominal wall was prepped with chlorhexidine in a sterile fashion, and a sterile drape was applied covering the operative field. A sterile gown and sterile gloves were used for the procedure. Local anesthesia was provided with 1% Lidocaine. Under CT guidance, an 18 gauge trocar needle was advanced into an abscess in the left lobe of the liver. After confirming needle tip position and aspiration of a small amount of fluid, a guidewire was advanced into the collection, the tract was dilated over the wire and a 10 French percutaneous pigtail drainage catheter advanced and formed. Drain position was  confirmed by CT. A larger fluid sample was then withdrawn from the drain and sent for culture analysis. The drain was flushed with sterile saline and connected to a suction bulb. The drainage catheter was secured at the skin with a Prolene retention suture and overlying sterile dressing. RADIATION DOSE REDUCTION: This exam was performed according to the departmental dose-optimization program which includes automated exposure control, adjustment of the mA and/or kV according to patient size and/or use of iterative reconstruction  technique. COMPLICATIONS: None FINDINGS: CT again demonstrates an ill-defined ovoid collection within the left lobe of the liver measuring approximately 6 cm in maximum diameter. After needle puncture, there was return of grossly purulent fluid. After placement of the drain, there is abundant return of purulent fluid. The communicating splenic collections appear similar by CT and may also represent abscesses. IMPRESSION: 1. CT-guided percutaneous catheter drainage of left lobe hepatic abscess yielding purulent fluid. A fluid sample was sent for culture analysis. A 10 French drainage catheter was placed and attached to suction bulb drainage. 2. Rounded communicating splenic collections are similar in appearance to the hepatic collection and are suspicious for splenic abscesses. Electronically Signed   By: Aletta Edouard M.D.   On: 09/29/2021 13:17   CT HEAD WO CONTRAST (5MM)  Result Date: 09/28/2021 CLINICAL DATA:  Initial evaluation for TIA, weakness. EXAM: CT HEAD WITHOUT CONTRAST TECHNIQUE: Contiguous axial images were obtained from the base of the skull through the vertex without intravenous contrast. RADIATION DOSE REDUCTION: This exam was performed according to the departmental dose-optimization program which includes automated exposure control, adjustment of the mA and/or kV according to patient size and/or use of iterative reconstruction technique. COMPARISON:  None Available.  FINDINGS: Brain: Cerebral volume within normal limits. No acute intracranial hemorrhage. No visible acute large vessel territory infarct. No mass lesion, midline shift or mass effect. No hydrocephalus or extra-axial fluid collection. Vascular: No hyperdense vessel. Focal calcification noted at the level of the right sylvian fissure, possibly an embolic calcification. Skull: Scalp soft tissues and calvarium within normal limits. Sinuses/Orbits: Globes orbital soft tissues within normal limits. Moderate left maxillary sinusitis noted. Moderate bilateral mastoid effusions noted as well. Other: None. IMPRESSION: 1. No acute intracranial abnormality. 2. Moderate left maxillary sinusitis. 3. Moderate bilateral mastoid effusions. Electronically Signed   By: Jeannine Boga M.D.   On: 09/28/2021 00:54   CT ABDOMEN PELVIS W CONTRAST  Addendum Date: 09/27/2021   ADDENDUM REPORT: 09/27/2021 23:49 ADDENDUM: These results were called by telephone at the time of interpretation on 09/27/2021 at 11:49 pm to provider Dr. Lucile Shutters, who verbally acknowledged these results. Electronically Signed   By: Ronney Asters M.D.   On: 09/27/2021 23:49   Result Date: 09/27/2021 CLINICAL DATA:  Sepsis, leukocytosis. EXAM: CT ABDOMEN AND PELVIS WITH CONTRAST TECHNIQUE: Multidetector CT imaging of the abdomen and pelvis was performed using the standard protocol following bolus administration of intravenous contrast. RADIATION DOSE REDUCTION: This exam was performed according to the departmental dose-optimization program which includes automated exposure control, adjustment of the mA and/or kV according to patient size and/or use of iterative reconstruction technique. CONTRAST:  <See Chart> OMNIPAQUE IOHEXOL 300 MG/ML  SOLN COMPARISON:  CT abdomen and CT abdomen and pelvis 09/10/2021. FINDINGS: Lower chest: Minimal left basilar atelectasis. New small left pleural effusion. Cardiomegaly. Hepatobiliary: There is a new hypodense lesion in the  left lobe of the liver peripherally measuring 5.1 x 5.7 x 6.1 cm. Portion of this abuts the lesser curvature of the stomach. Gallbladder and bile ducts are within normal limits. Pancreas: Unremarkable. No pancreatic ductal dilatation or surrounding inflammatory changes. Spleen: Again seen are low attenuation areas throughout the spleen. These have increased in size. The largest area is now more oval in shape measuring 5.9 x 10.0 by 8.9 cm. This is subcapsular in location. Adrenals/Urinary Tract: Bilateral adrenal glands appear within normal limits scratch fat. Bilateral renal atrophy is again seen. Bilateral perinephric stranding and fluid has increased and is now tracking along the  retroperitoneum. No hydronephrosis. Bladder is decompressed. Stomach/Bowel: Stomach is within normal limits. No evidence of bowel wall thickening, distention, or inflammatory changes. Appendix is not seen. Vascular/Lymphatic: Aortic atherosclerosis. No enlarged abdominal or pelvic lymph nodes. Reproductive: Uterus and bilateral adnexa are unremarkable. Other: No abdominal wall hernia or abnormality. No abdominopelvic ascites. Musculoskeletal: No acute or significant osseous findings. IMPRESSION: 1. There is a new 5.1 x 5.7 x 6.1 cm hypodense area in the subcapsular left lobe of the liver. Findings are concerning for abscess given the clinical history. 2. 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 Findings may represent subcapsular hematoma given recent infarcts, but splenic abscess is also in the differential given findings in the liver. 3. Bilateral renal atrophy. Increasing perinephric stranding and fluid may represent infection. 4. New small left pleural effusion. 5. Aortic Atherosclerosis (ICD10-I70.0). Electronically Signed: By: Ronney Asters M.D. On: 09/27/2021 23:27   ECHO TEE  Result Date: 09/27/2021    TRANSESOPHOGEAL ECHO REPORT   Patient  Name:   Courtney Gray Date of Exam: 09/27/2021 Medical Rec #:  [id number]       Height:       65.0 in Accession #:    [id number]      Weight:       339.9 lb Date of Birth:  Feb 06, 1971        BSA:          2.478 m Patient Age:    56 years        BP:           105/64 mmHg Patient Gender: F               HR:           107 bpm. Exam Location:  Inpatient Procedure: Transesophageal Echo, Color Doppler, Cardiac Doppler and 3D Echo Indications:     Bacteremia. Splenic infarct.  History:         Patient has prior history of Echocardiogram examinations, most                  recent 09/10/2021. ESRD; Risk Factors:Diabetes and Hypertension.  Sonographer:     Johny Chess RDCS Sonographer#2:   Eartha Inch Referring Phys:  Nancy Fetter DUNN Diagnosing Phys: Kirk Ruths MD PROCEDURE: After discussion of the risks and benefits of a TEE, an informed consent was obtained from the patient. The patient was intubated. The transesophogeal probe was passed without difficulty through the esophogus of the patient. Sedation performed  by different physician. The patient was monitored while under deep sedation. The patient developed no complications during the procedure. IMPRESSIONS  1. Dialysis catheter noted in SVC; calcified right atrial mass noted (etiology unclear; possible calcified thrombus); if pt can tolerate, cardiac MRI could provide more definitive evaluation.  2. Left ventricular ejection fraction, by estimation, is 65 to 70%. The left ventricle has normal function. The left ventricle has no regional wall motion abnormalities.  3. Right ventricular systolic function is normal. The right ventricular size is normal.  4. Left atrial size was mildly dilated. No left atrial/left atrial appendage thrombus was detected.  5. Right atrial size was mildly dilated.  6. The mitral valve is normal in structure. Mild mitral valve regurgitation. Moderate mitral annular calcification.  7. Tricuspid valve regurgitation is moderate.  8.  The aortic valve is tricuspid. Aortic valve regurgitation is not visualized. Aortic valve sclerosis is present, with  no evidence of aortic valve stenosis.  9. There is mild (Grade II) plaque involving the descending aorta. 10. Evidence of atrial level shunting detected by color flow Doppler. Agitated saline contrast bubble study was positive with shunting observed within 3-6 cardiac cycles suggestive of interatrial shunt. FINDINGS  Left Ventricle: Left ventricular ejection fraction, by estimation, is 65 to 70%. The left ventricle has normal function. The left ventricle has no regional wall motion abnormalities. The left ventricular internal cavity size was normal in size. Right Ventricle: The right ventricular size is normal. Right ventricular systolic function is normal. Left Atrium: Left atrial size was mildly dilated. No left atrial/left atrial appendage thrombus was detected. Right Atrium: Right atrial size was mildly dilated. Pericardium: There is no evidence of pericardial effusion. Mitral Valve: The mitral valve is normal in structure. Moderate mitral annular calcification. Mild mitral valve regurgitation. Tricuspid Valve: The tricuspid valve is normal in structure. Tricuspid valve regurgitation is moderate. Aortic Valve: The aortic valve is tricuspid. Aortic valve regurgitation is not visualized. Aortic valve sclerosis is present, with no evidence of aortic valve stenosis. Pulmonic Valve: The pulmonic valve was normal in structure. Pulmonic valve regurgitation is trivial. Aorta: The aortic root is normal in size and structure. There is mild (Grade II) plaque involving the descending aorta. IAS/Shunts: The interatrial septum is aneurysmal. Evidence of atrial level shunting detected by color flow Doppler. Agitated saline contrast bubble study was positive with shunting observed within 3-6 cardiac cycles suggestive of interatrial shunt. Additional Comments: Dialysis catheter noted in SVC; calcified right atrial  mass noted (etiology unclear; possible calcified thrombus); if pt can tolerate, cardiac MRI could provide more definitive evaluation.  TRICUSPID VALVE TR Peak grad:   54.5 mmHg TR Vmax:        369.00 cm/s Kirk Ruths MD Electronically signed by Kirk Ruths MD Signature Date/Time: 09/27/2021/2:25:38 PM    Final    DG CHEST PORT 1 VIEW  Result Date: 09/15/2021 CLINICAL DATA:  Hypoxia EXAM: PORTABLE CHEST 1 VIEW COMPARISON:  Chest radiograph done on 09/11/2021 CT done on 09/12/2021 FINDINGS: Transverse diameter of heart is increased. Central pulmonary vessels are more prominent. There is interval increase in interstitial and alveolar markings in the parahilar regions and lower lung fields. There is blunting of lateral CP angles suggesting small bilateral effusions. Tip of dialysis catheter is seen in the right atrium. Tip of left IJ central venous catheter is seen in the superior vena cava. IMPRESSION: Cardiomegaly. Central pulmonary vessels are more prominent suggesting CHF. Interval increase in interstitial and alveolar markings in both lungs suggest pulmonary edema. Small bilateral pleural effusions. Electronically Signed   By: Elmer Picker M.D.   On: 09/15/2021 13:34   DG Abd 1 View  Result Date: 09/12/2021 CLINICAL DATA:  Nasogastric tube placement EXAM: ABDOMEN - 1 VIEW COMPARISON:  09/10/2021 FINDINGS: One view of the lower chest and upper abdomen. One view of the upper abdomen. Nasogastric tube is followed to the level of the gastric body, not visualized more distally. Expected limitations secondary to patient morbid obesity. No gross free intraperitoneal air. Cardiomegaly with probable layering bilateral pleural effusions. Dialysis catheter tip at low SVC. Left subclavian line tip at high SVC. IMPRESSION: Although the nasogastric tube is difficult to follow distally, likely terminates at the gastric body. Electronically Signed   By: Abigail Miyamoto M.D.   On: 09/12/2021 19:48   CT ANGIO CHEST  AORTA W/CM & OR WO/CM  Addendum Date: 09/12/2021   ADDENDUM REPORT: 09/12/2021 16:34 ADDENDUM:  These results were called by telephone at the time of interpretation on 09/12/2021 at 4:33 pm to provider Dr. Loanne Drilling, who verbally acknowledged these results. Electronically Signed   By: Ronney Asters M.D.   On: 09/12/2021 16:34   Result Date: 09/12/2021 CLINICAL DATA:  Evaluate for embolism.  Chest pain. EXAM: CT ANGIOGRAPHY CHEST WITH CONTRAST TECHNIQUE: Multidetector CT imaging of the chest was performed using the standard protocol during bolus administration of intravenous contrast. Multiplanar CT image reconstructions and MIPs were obtained to evaluate the vascular anatomy. RADIATION DOSE REDUCTION: This exam was performed according to the departmental dose-optimization program which includes automated exposure control, adjustment of the mA and/or kV according to patient size and/or use of iterative reconstruction technique. CONTRAST:  182m OMNIPAQUE IOHEXOL 350 MG/ML SOLN COMPARISON:  None Available. FINDINGS: Cardiovascular: Satisfactory opacification of the pulmonary arteries to the segmental level. No evidence of pulmonary embolism. Heart is enlarged. No pericardial effusion. There are atherosclerotic calcifications of the aorta and coronary arteries. Right-sided central venous catheter tip is in the right atrium. Left-sided central venous catheter tip is at the brachiocephalic SVC junction. Mediastinum/Nodes: No enlarged mediastinal, hilar, or axillary lymph nodes. Esophagus is nondilated. Enteric tube tip is in the upper thoracic esophagus near the level of the thoracic inlet. Visualized thyroid gland is within normal limits. Lungs/Pleura: There is a small amount of atelectasis and airspace disease in the inferior left lower lobe. There are trace bilateral pleural effusions. There are atelectatic changes in the right middle lobe and right lower lobe. No evidence for pneumothorax. Trachea and central airways  are patent. Upper Abdomen: No acute abnormality. Musculoskeletal: No chest wall abnormality. No acute or significant osseous findings. Review of the MIP images confirms the above findings. IMPRESSION: 1. No evidence for pulmonary embolism. 2. Small amount of atelectasis and airspace disease in the left lower lobe worrisome for infection. 3. Trace bilateral pleural effusions. 4. Enteric tube tip is in the proximal thoracic esophagus. Recommend repositioning. Aortic Atherosclerosis (ICD10-I70.0). Electronically Signed: By: ARonney AstersM.D. On: 09/12/2021 16:20   IR Fluoro Guide CV Line Left  Result Date: 09/12/2021 INDICATION: 51year old female referred for central venous catheter EXAM: IMAGE GUIDED PLACEMENT OF LEFT EXTERNAL JUGULAR VEIN CENTRAL VENOUS CATHETER MEDICATIONS: None ANESTHESIA/SEDATION: None FLUOROSCOPY: Radiation Exposure Index (as provided by the fluoroscopic device): 11 mGy Kerma COMPLICATIONS: None PROCEDURE: Informed written consent was obtained from the patient's family after a discussion of the risks, benefits, and alternatives to treatment. Questions regarding the procedure were encouraged and answered. The right neck was prepped with chlorhexidine in a sterile fashion, and a sterile drape was applied covering the operative field. Maximum barrier sterile technique with sterile gowns and gloves were used for the procedure. A timeout was performed prior to the initiation of the procedure. A micropuncture kit was utilized to access the left external jugular vein under direct, real-time ultrasound guidance after the overlying soft tissues were anesthetized with 1% lidocaine with epinephrine. Ultrasound image documentation was performed. Guidewire was advanced to the level of the IVC. A non tunneled standard triple-lumen catheter was placed. Final catheter positioning was confirmed and documented with a spot radiographic image. The catheter aspirates and flushes normally. Dressings were applied.  The patient tolerated the procedure well without immediate post procedural complication. IMPRESSION: Status post image guided placement left external jugular vein central venous catheter. Signed, JDulcy Fanny WDellia Nims RPVI [facility] Electronically Signed   By: JCorrie MckusickD.O.   On: 09/12/2021  08:29   IR US Guide Vasc Access Left  Result Date: 09/12/2021 INDICATION: 51 year old female referred for central venous catheter EXAM: IMAGE GUIDED PLACEMENT OF LEFT EXTERNAL JUGULAR VEIN CENTRAL VENOUS CATHETER MEDICATIONS: None ANESTHESIA/SEDATION: None FLUOROSCOPY: Radiation Exposure Index (as provided by the fluoroscopic device): 11 mGy Kerma COMPLICATIONS: None PROCEDURE: Informed written consent was obtained from the patient's family after a discussion of the risks, benefits, and alternatives to treatment. Questions regarding the procedure were encouraged and answered. The right neck was prepped with chlorhexidine in a sterile fashion, and a sterile drape was applied covering the operative field. Maximum barrier sterile technique with sterile gowns and gloves were used for the procedure. A timeout was performed prior to the initiation of the procedure. A micropuncture kit was utilized to access the left external jugular vein under direct, real-time ultrasound guidance after the overlying soft tissues were anesthetized with 1% lidocaine with epinephrine. Ultrasound image documentation was performed. Guidewire was advanced to the level of the IVC. A non tunneled standard triple-lumen catheter was placed. Final catheter positioning was confirmed and documented with a spot radiographic image. The catheter aspirates and flushes normally. Dressings were applied. The patient tolerated the procedure well without immediate post procedural complication. IMPRESSION: Status post image guided placement left external jugular vein central venous catheter. Signed, Dulcy Fanny. Dellia Nims, RPVI [facility] Radiology Electronically Signed   By: Corrie Mckusick D.O.   On: 09/12/2021 08:29   DG Finger Middle Left  Result Date: 09/11/2021 CLINICAL DATA:  [id number], ischemic ulcer at the tip of the left middle finger for a few weeks, evaluate for osteomyelitis EXAM: LEFT MIDDLE FINGER three views COMPARISON:  None Available. FINDINGS: The phalanges with attention of the distal phalanx of the middle finger have a normal appearance. Mild arthropathy and is more prominent at the distal interphalangeal joint. There is a small flat soft tissue defect at the pulp of the middle finger of likely known ulcer. There is mild tapering seen at the tip of the middle finger. Vascular calcifications. IMPRESSION: Phalanges of the middle finger with attention to the distal phalanx have a normal appearance without evidence of osteomyelitis. Mild arthropathy and is most prominent at the DIP. Mild tapering of the pulp of the middle finger. Vascular calcifications. Electronically Signed   By: Frazier Richards M.D.   On: 09/11/2021 10:17   IR PICC PLACEMENT RIGHT >5 YRS INC IMG GUIDE  Result Date: 09/11/2021 INDICATION: 51 year old female with history of end-stage renal disease, currently with poor venous access. EXAM: 1. Ultrasound-guided vascular access of the right internal jugular vein. 2. Limited right central venogram. 3. Ultrasound-guided vascular access of the right basilic vein. 4. Placement of right upper extremity midline. MEDICATIONS: None. CONTRAST:  5 mL Omnipaque 300, intravenous FLUOROSCOPY TIME:  6 minutes, 36 seconds, 04.5 mGy COMPLICATIONS: None immediate. TECHNIQUE: The procedure, risks, benefits, and alternatives were explained to the patient and informed written consent was obtained. A timeout was performed prior to the initiation of the procedure. The right neck was prepped and draped in standard fashion. Preprocedure ultrasound evaluation  demonstrated partially occluded right internal jugular vein with indwelling right internal jugular vein tunneled hemodialysis catheter. Subdermal Local anesthesia was administered at the planned needle entry site with 1% lidocaine. A small skin nick was made. Under direct ultrasound visualization, a 21 gauge micropuncture was used to puncture the right internal jugular vein. A permanent ultrasound image was captured and stored in the record. A 0.018 inch wire  was inserted in met resistance. Under fluoroscopic guidance, the wire was attempted to be navigated around the indwelling hemodialysis catheter which was unsuccessful. 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 The needle was removed and hemostasis was achieved with manual compression. The left neck was examined under ultrasound which demonstrated an occluded left internal jugular vein. There was a patent vein laterally, possibly representing the external jugular vein, however this vein was not visualized clearly communicating with the central veins. Therefore, the decision was made to place right upper extremity midline. This is the same extremity of the abandoned arteriovenous fistula. The right upper extremity was prepped with chlorhexidine in a sterile fashion, and a sterile drape was applied covering the operative field. Maximum barrier sterile technique with sterile gowns and gloves were used for the procedure. A timeout was performed prior to the initiation of the procedure. Local anesthesia was provided with 1% lidocaine. Under direct ultrasound guidance, the basilic vein was accessed with a micropuncture kit after the overlying soft tissues were anesthetized with 1% lidocaine. After the overlying soft tissues were anesthetized, a small venotomy incision was created and a micropuncture kit was utilized to  access the right basilic vein. Real-time ultrasound guidance was utilized for vascular access including the acquisition of a permanent ultrasound image documenting patency of the accessed vessel. A guidewire was advanced to the level of the axillary vein. The PICC line was cut to 15 cm. A peel-away sheath was placed and a cm, 5 [place], dual lumen was inserted to level of the axillary. A post procedure spot fluoroscopic was obtained. The catheter easily aspirated and flushed and was secured in place with stat lock device. A dressing was applied. The patient tolerated the procedure well without immediate post procedural complication. FINDINGS: Occluded bilateral internal jugular veins. After catheter placement, the tip lies within the right axillary vein the catheter aspirates and flushes normally and is ready for immediate use. IMPRESSION: Successful ultrasound and fluoroscopic guided placement of a right basilic vein approach, 15 cm, 5 [place], dual lumen midline with tip in the right axillary vein. The line is ready for immediate use. Ruthann Cancer, MD [facility] Radiology Electronically Signed   By: Ruthann Cancer M.D.   On: 09/11/2021 08:39   IR Veno/Jugular Right  Result Date: 09/11/2021 INDICATION: 51 year old female with history of end-stage renal disease, currently with poor venous access. EXAM: 1. Ultrasound-guided vascular access of the right internal jugular vein. 2. Limited right central venogram. 3. Ultrasound-guided vascular access of the right basilic vein. 4. Placement of right upper extremity midline. MEDICATIONS: None. CONTRAST:  5 mL Omnipaque 300, intravenous FLUOROSCOPY TIME:  6 minutes, 36 seconds, 16.1 mGy COMPLICATIONS: None immediate. TECHNIQUE: The procedure, risks, benefits, and alternatives were explained to the patient and informed written consent was obtained. A timeout was performed prior to the initiation of the procedure. The right  neck was prepped and draped in standard fashion. Preprocedure ultrasound evaluation demonstrated partially occluded right internal jugular vein with indwelling right internal jugular vein tunneled hemodialysis catheter. Subdermal Local anesthesia was administered at the planned needle entry site with 1% lidocaine. A small skin nick was made. Under direct ultrasound visualization, a 21 gauge micropuncture was used to puncture the right internal jugular vein. A permanent ultrasound image was captured and stored in the record. A 0.018 inch wire was inserted in met resistance. Under fluoroscopic guidance,  the wire was attempted to be navigated around the indwelling hemodialysis catheter which was unsuccessful. 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 The needle was removed and hemostasis was achieved with manual compression. The left neck was examined under ultrasound which demonstrated an occluded left internal jugular vein. There was a patent vein laterally, possibly representing the external jugular vein, however this vein was not visualized clearly communicating with the central veins. Therefore, the decision was made to place right upper extremity midline. This is the same extremity of the abandoned arteriovenous fistula. The right upper extremity was prepped with chlorhexidine in a sterile fashion, and a sterile drape was applied covering the operative field. Maximum barrier sterile technique with sterile gowns and gloves were used for the procedure. A timeout was performed prior to the initiation of the procedure. Local anesthesia was provided with 1% lidocaine. Under direct ultrasound guidance, the basilic vein was accessed with a micropuncture kit after the overlying soft tissues were anesthetized with 1% lidocaine. After the overlying soft tissues were  anesthetized, a small venotomy incision was created and a micropuncture kit was utilized to access the right basilic vein. Real-time ultrasound guidance was utilized for vascular access including the acquisition of a permanent ultrasound image documenting patency of the accessed vessel. A guidewire was advanced to the level of the axillary vein. The PICC line was cut to 15 cm. A peel-away sheath was placed and a cm, 5 [place], dual lumen was inserted to level of the axillary. A post procedure spot fluoroscopic was obtained. The catheter easily aspirated and flushed and was secured in place with stat lock device. A dressing was applied. The patient tolerated the procedure well without immediate post procedural complication. FINDINGS: Occluded bilateral internal jugular veins. After catheter placement, the tip lies within the right axillary vein the catheter aspirates and flushes normally and is ready for immediate use. IMPRESSION: Successful ultrasound and fluoroscopic guided placement of a right basilic vein approach, 15 cm, 5 [place], dual lumen midline with tip in the right axillary vein. The line is ready for immediate use. Ruthann Cancer, MD [facility] Radiology Electronically Signed   By: Ruthann Cancer M.D.   On: 09/11/2021 08:39   IR US Guide Vasc Access Right  Result Date: 09/11/2021 INDICATION: 51 year old female with history of end-stage renal disease, currently with poor venous access. EXAM: 1. Ultrasound-guided vascular access of the right internal jugular vein. 2. Limited right central venogram. 3. Ultrasound-guided vascular access of the right basilic vein. 4. Placement of right upper extremity midline. MEDICATIONS: None. CONTRAST:  5 mL Omnipaque 300, intravenous FLUOROSCOPY TIME:  6 minutes, 36 seconds, 85.4 mGy COMPLICATIONS: None immediate. TECHNIQUE: The procedure, risks, benefits, and alternatives were explained to the patient and informed written  consent was obtained. A timeout was performed prior to the initiation of the procedure. The right neck was prepped and draped in standard fashion. Preprocedure ultrasound evaluation demonstrated partially occluded right internal jugular vein with indwelling right internal jugular vein tunneled hemodialysis catheter. Subdermal Local anesthesia was administered at the planned needle entry site with 1% lidocaine. A small skin nick was made. Under direct ultrasound visualization, a 21 gauge micropuncture was used to puncture the right internal jugular vein. A permanent ultrasound image was captured and stored in the record. A 0.018 inch wire was inserted in met resistance. Under fluoroscopic guidance, the wire was attempted to  be navigated around the indwelling hemodialysis catheter which was unsuccessful. 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 The needle was removed and hemostasis was achieved with manual compression. The left neck was examined under ultrasound which demonstrated an occluded left internal jugular vein. There was a patent vein laterally, possibly representing the external jugular vein, however this vein was not visualized clearly communicating with the central veins. Therefore, the decision was made to place right upper extremity midline. This is the same extremity of the abandoned arteriovenous fistula. The right upper extremity was prepped with chlorhexidine in a sterile fashion, and a sterile drape was applied covering the operative field. Maximum barrier sterile technique with sterile gowns and gloves were used for the procedure. A timeout was performed prior to the initiation of the procedure. Local anesthesia was provided with 1% lidocaine. Under direct ultrasound guidance, the basilic vein was accessed with a micropuncture kit after the  overlying soft tissues were anesthetized with 1% lidocaine. After the overlying soft tissues were anesthetized, a small venotomy incision was created and a micropuncture kit was utilized to access the right basilic vein. Real-time ultrasound guidance was utilized for vascular access including the acquisition of a permanent ultrasound image documenting patency of the accessed vessel. A guidewire was advanced to the level of the axillary vein. The PICC line was cut to 15 cm. A peel-away sheath was placed and a cm, 5 [place], dual lumen was inserted to level of the axillary. A post procedure spot fluoroscopic was obtained. The catheter easily aspirated and flushed and was secured in place with stat lock device. A dressing was applied. The patient tolerated the procedure well without immediate post procedural complication. FINDINGS: Occluded bilateral internal jugular veins. After catheter placement, the tip lies within the right axillary vein the catheter aspirates and flushes normally and is ready for immediate use. IMPRESSION: Successful ultrasound and fluoroscopic guided placement of a right basilic vein approach, 15 cm, 5 [place], dual lumen midline with tip in the right axillary vein. The line is ready for immediate use. Ruthann Cancer, MD [facility] Radiology Electronically Signed   By: Ruthann Cancer M.D.   On: 09/11/2021 08:39   DG Chest Port 1 View  Result Date: 09/11/2021 CLINICAL DATA:  Acute respiratory difficulty EXAM: PORTABLE CHEST 1 VIEW COMPARISON:  09/10/2021 FINDINGS: Cardiac shadow is enlarged but stable. Gastric catheter is noted extending into the stomach. Right jugular dialysis catheter is again seen and stable. The lungs show mild left basilar atelectasis. No pneumothorax is seen. The recently placed right midline catheter is not appreciated on this exam. IMPRESSION: Mild left basilar atelectasis. Electronically Signed   By: Inez Catalina M.D.   On:  09/11/2021 03:05   DG Abd Portable 1V  Result Date: 09/10/2021 CLINICAL DATA:  [id number].  Feeding tube placement. EXAM: PORTABLE ABDOMEN - 1 VIEW COMPARISON:  None Available. FINDINGS: Enteric tube coursing below the hemidiaphragm with side port overlying the expected region of the gastric lumen and tip collimated off view. Partially visualized dialysis catheter with tip overlying the right atrium. Visualized lungs demonstrate low lung volumes with airspace opacity-please see separately dictated chest x-ray 09/10/2021 and CT abdomen pelvis 09/10/2021. The visualized bowel gas pattern is normal. No radio-opaque calculi or other significant radiographic abnormality are seen. Atherosclerotic plaque. IMPRESSION: 1. Enteric tube in good position with tip collimated off view. 2.  Aortic Atherosclerosis (  ICD10-I70.0). Electronically Signed   By: Iven Finn M.D.   On: 09/10/2021 18:19   ECHOCARDIOGRAM COMPLETE  Result Date: 09/10/2021    ECHOCARDIOGRAM REPORT   Patient Name:   Courtney Gray Date of Exam: 09/10/2021 Medical Rec #:  [id number]       Height:       65.0 in Accession #:    [id number]      Weight:       332.0 lb Date of Birth:  [birth year]        BSA:          2.454 m Patient Age:    24 years        BP:           79/35 mmHg Patient Gender: F               HR:           72 bpm. Exam Location:  Inpatient Procedure: 2D Echo, Cardiac Doppler and Color Doppler Indications:    check for thrombus  History:        Patient has prior history of Echocardiogram examinations, most                 recent 05/17/2021. Stroke; Risk Factors:Diabetes, Dyslipidemia                 and Hypertension. Cath2/14/23.  Sonographer:    Luisa Hart RDCS Referring Phys: [id number] [place]  Sonographer Comments: Technically difficult study due to poor echo windows and patient is morbidly obese. Image acquisition challenging due to patient body habitus. IMPRESSIONS  1. Poor quality study. Globally LV function appears normal.  2.  Left ventricular ejection fraction, by estimation, is 50 to 55%. The left ventricle has low normal function. Left ventricular endocardial border not optimally defined to evaluate regional wall motion. There is moderate concentric left ventricular hypertrophy. Left ventricular diastolic parameters are consistent with Grade II diastolic dysfunction (pseudonormalization).  3. Right ventricular systolic function was not well visualized. The right ventricular size is not well visualized. There is mildly elevated pulmonary artery systolic pressure.  4. Left atrial size was mild to moderately dilated.  5. The mitral valve is degenerative. Trivial mitral valve regurgitation. No evidence of mitral stenosis.  6. Tricuspid valve regurgitation is mild to moderate.  7. The aortic valve is tricuspid. Aortic valve regurgitation is not visualized. Mild aortic valve stenosis.  8. The inferior vena cava is normal in size with <50% respiratory variability, suggesting right atrial pressure of 8 mmHg. Comparison(s): No significant change from prior study. FINDINGS  Left Ventricle: Left ventricular ejection fraction, by estimation, is 50 to 55%. The left ventricle has low normal function. Left ventricular endocardial border not optimally defined to evaluate regional wall motion. The left ventricular internal cavity  size was normal in size. There is moderate concentric left ventricular hypertrophy. Left ventricular diastolic parameters are consistent with Grade II diastolic dysfunction (pseudonormalization). Right Ventricle: The right ventricular size is not well visualized. Right vetricular wall thickness was not well visualized. Right ventricular systolic function was not well visualized. There is mildly elevated pulmonary artery systolic pressure. The tricuspid regurgitant velocity is 2.79 m/s, and with an assumed right atrial pressure of 8 mmHg, the estimated right ventricular systolic pressure is 41.9 mmHg. Left Atrium: Left atrial  size was mild to moderately dilated. Right Atrium: Right atrial size was not well visualized. Pericardium: Trivial pericardial effusion is present. Mitral Valve: The mitral valve is degenerative  in appearance. Mild to moderate mitral annular calcification. Trivial mitral valve regurgitation. No evidence of mitral valve stenosis. MV peak gradient, 4.2 mmHg. The mean mitral valve gradient is 2.0 mmHg. Tricuspid Valve: The tricuspid valve is grossly normal. Tricuspid valve regurgitation is mild to moderate. No evidence of tricuspid stenosis. Aortic Valve: The aortic valve is tricuspid. Aortic valve regurgitation is not visualized. Mild aortic stenosis is present. Aortic valve mean gradient measures 12.0 mmHg. Aortic valve peak gradient measures 22.9 mmHg. Aortic valve area, by VTI measures 0.80 cm. Pulmonic Valve: The pulmonic valve was grossly normal. Pulmonic valve regurgitation is not visualized. No evidence of pulmonic stenosis. Aorta: The aortic root and ascending aorta are structurally normal, with no evidence of dilitation. Venous: The inferior vena cava is normal in size with less than 50% respiratory variability, suggesting right atrial pressure of 8 mmHg. IAS/Shunts: The interatrial septum was not well visualized.  LEFT VENTRICLE PLAX 2D LVIDd:         4.85 cm   Diastology LVIDs:         3.90 cm   LV e' medial:    6.20 cm/s LV PW:         1.40 cm   LV E/e' medial:  15.6 LV IVS:        1.55 cm   LV e' lateral:   7.33 cm/s LVOT diam:     1.80 cm   LV E/e' lateral: 13.2 LV SV:         34 LV SV Index:   14 LVOT Area:     2.54 cm  RIGHT VENTRICLE RV S prime:     12.60 cm/s TAPSE (M-mode): 2.4 cm LEFT ATRIUM              Index LA diam:        3.60 cm  1.47 cm/m LA Vol (A2C):   77.6 ml  31.63 ml/m LA Vol (A4C):   116.0 ml 47.28 ml/m LA Biplane Vol: 96.2 ml  39.21 ml/m  AORTIC VALVE                     PULMONIC VALVE AV Area (Vmax):    0.81 cm      PV Vmax:       0.94 m/s AV Area (Vmean):   0.77 cm      PV  Vmean:      64.000 cm/s AV Area (VTI):     0.80 cm      PV VTI:        0.187 m AV Vmax:           239.50 cm/s   PV Peak grad:  3.5 mmHg AV Vmean:          160.500 cm/s  PV Mean grad:  2.0 mmHg AV VTI:            0.424 m AV Peak Grad:      22.9 mmHg AV Mean Grad:      12.0 mmHg LVOT Vmax:         76.00 cm/s LVOT Vmean:        48.500 cm/s LVOT VTI:          0.134 m LVOT/AV VTI ratio: 0.32  AORTA Ao Root diam: 3.00 cm Ao Asc diam:  3.30 cm MITRAL VALVE               TRICUSPID VALVE MV Area (PHT): 2.96 cm    TR Peak grad:  31.1 mmHg MV Area VTI:   0.99 cm    TR Vmax:        279.00 cm/s MV Peak grad:  4.2 mmHg MV Mean grad:  2.0 mmHg    SHUNTS MV Vmax:       1.03 m/s    Systemic VTI:  0.13 m MV Vmean:      74.7 cm/s   Systemic Diam: 1.80 cm MV Decel Time: 256 msec MR Peak grad: 61.3 mmHg MR Mean grad: 38.7 mmHg MR Vmax:      391.33 cm/s MR Vmean:     292.0 cm/s MV E velocity: 96.50 cm/s MV A velocity: 77.10 cm/s MV E/A ratio:  1.25 Eleonore Chiquito MD Electronically signed by Eleonore Chiquito MD Signature Date/Time: 09/10/2021/3:23:32 PM    Final    CT ABDOMEN W CONTRAST  Result Date: 09/10/2021 CLINICAL DATA:  Nausea, vomiting, and abdominal pain. Abnormal spleen. EXAM: CT ABDOMEN WITH CONTRAST TECHNIQUE: Multidetector CT imaging of the abdomen was performed using the standard protocol following bolus administration of intravenous contrast. RADIATION DOSE REDUCTION: This exam was performed according to the departmental dose-optimization program which includes automated exposure control, adjustment of the mA and/or kV according to patient size and/or use of iterative reconstruction technique. CONTRAST:  120m OMNIPAQUE IOHEXOL 300 MG/ML  SOLN COMPARISON:  09/10/2021 FINDINGS: Lower chest: Heart is enlarged and coronary artery calcifications are noted. The distal tip of a central venous catheter terminates in the right atrium. There is a small left pleural effusion with atelectasis or infiltrate at the left lung base. Mild  atelectasis is noted at the right lung base. Hepatobiliary: No focal liver abnormality is seen. No gallstones, gallbladder wall thickening, or biliary dilatation. Pancreas: Unremarkable. No pancreatic ductal dilatation or surrounding inflammatory changes. Spleen: Spleen is normal in size. There are linear hypodense bands in the upper and lower spleen with a trace amount of perisplenic free fluid. Adrenals/Urinary Tract: No adrenal nodule or mass. Renal atrophy is noted bilaterally. Vascular calcifications are noted bilaterally. No hydroureteronephrosis bilaterally. Stomach/Bowel: Stomach is within normal limits. The appendix is excluded from the field of view. No evidence of bowel wall thickening, distention, or inflammatory changes. Vascular/Lymphatic: Aortic atherosclerosis. No enlarged abdominal or pelvic lymph nodes. Other: Trace amount of free fluid in the perisplenic space. Musculoskeletal: Degenerative changes in the thoracolumbar spine. No acute osseous abnormality. IMPRESSION: 1. Hypoenhancing bands in the spleen with a small amount of perisplenic free fluid, suggesting infarcts. Laceration or rupture could also be considered if there is a history of trauma. 2. Trace left pleural effusion with atelectasis or infiltrate. 3. Bilateral renal atrophy. 4. Aortic atherosclerosis. Electronically Signed   By: LBrett FairyM.D.   On: 09/10/2021 03:25   CT Abdomen Pelvis Wo Contrast  Result Date: 09/10/2021 CLINICAL DATA:  Abdominal pain EXAM: CT ABDOMEN AND PELVIS WITHOUT CONTRAST TECHNIQUE: Multidetector CT imaging of the abdomen and pelvis was performed following the standard protocol without IV contrast. RADIATION DOSE REDUCTION: This exam was performed according to the departmental dose-optimization program which includes automated exposure control, adjustment of the mA and/or kV according to patient size and/or use of iterative reconstruction technique. COMPARISON:  05/19/2021 FINDINGS: Lower chest: Mild  basilar atelectasis on the left is noted. No sizable effusion is seen. Cardiac enlargement is again noted with dialysis catheter in place. Hepatobiliary: No focal liver abnormality is seen. No gallstones, gallbladder wall thickening, or biliary dilatation. Pancreas: Unremarkable. No pancreatic ductal dilatation or surrounding inflammatory changes. Spleen: Spleen demonstrates decreased attenuation  within the substance of the spleen as well as indistinctness along the superior and lateral contour of the spleen. This is of uncertain significance although the possibility of an underlying splenic injury could not be totally excluded. Adrenals/Urinary Tract: Adrenal glands are within normal limits. The kidneys are atrophic consistent with the known end-stage renal disease. Obstructive changes are seen. The bladder is decompressed. Stomach/Bowel: No obstructive or inflammatory changes of the colon are seen. The appendix is within normal limits. Small bowel and stomach are unremarkable. Vascular/Lymphatic: Diffuse vascular calcifications are seen without acute abnormality. No aneurysmal dilatation is noted. Reproductive: Calcifications are noted within the uterus related to arterial calcifications. No adnexal mass is seen. Other: No abdominal wall hernia or abnormality. No abdominopelvic ascites. Musculoskeletal: No acute or significant osseous findings. IMPRESSION: Indistinctness along the margin of the spleen as described. This is of uncertain significance although the possibility of a splenic hemorrhage or injury could not be totally excluded. Repeat CT of the abdomen with contrast is recommended for further evaluation. Basilar atelectasis on the left. No findings to suggest bowel obstruction are seen. Changes consistent with end-stage renal disease. Critical Value/emergent results were called by telephone at the time of interpretation on 09/10/2021 at 1:55 am to Dr. Quintella Reichert , who verbally acknowledged these  results. Electronically Signed   By: Inez Catalina M.D.   On: 09/10/2021 02:10   DG Chest Port 1 View  Result Date: 09/10/2021 CLINICAL DATA:  Shortness of breath and cough. EXAM: PORTABLE CHEST 1 VIEW COMPARISON:  Sep 03, 2021 FINDINGS: There is stable right-sided venous catheter positioning. There is stable moderate to marked severity enlargement of the cardiac silhouette. There is no evidence of acute infiltrate, pleural effusion or pneumothorax the visualized skeletal structures are unremarkable. IMPRESSION: Stable cardiomegaly without evidence of acute or active cardiopulmonary disease. Electronically Signed   By: Virgina Norfolk M.D.   On: 09/10/2021 01:16    Labs:  CBC: Recent Labs    10/06/21 0323 10/07/21 0520 10/08/21 0506 10/09/21 0050  WBC 21.4* 19.7* 20.7* 21.3*  HGB 7.2* 7.2* 7.1* 7.3*  HCT 25.7* 24.6* 23.9* 25.6*  PLT 310 298 306 307    COAGS: Recent Labs    07/10/21 0627 09/10/21 0403 09/28/21 0151 09/30/21 0500 09/30/21 1049 10/01/21 0847 10/02/21 0621 10/09/21 1219  INR 1.3* 1.3*  --   --   --   --   --  1.3*  APTT  --   --    < > 85* 72* 69* 65*  --    < > = values in this interval not displayed.    BMP: Recent Labs    10/06/21 0323 10/07/21 0520 10/08/21 0506 10/09/21 0050  NA 132* 132* 131* 132*  K 3.8 3.9 4.0 3.6  CL 97* 95* 94* 94*  CO2 27 27 25 25   GLUCOSE 111* 77 86 127*  BUN 12 20 26* 18  CALCIUM 7.8* 8.1* 8.4* 8.3*  CREATININE 3.66* 5.11* 6.14* 4.44*  GFRNONAA 14* 10* 8* 11*    LIVER FUNCTION TESTS: Recent Labs    09/03/21 2040 09/10/21 0031 09/10/21 0920 09/11/21 0324 09/14/21 0636 09/19/21 0915 10/06/21 0323 10/07/21 0520 10/08/21 0506 10/09/21 0050  BILITOT 0.4 0.7  --  0.8 0.7  --   --   --   --   --   AST 23 18  --  30 16  --   --   --   --   --   ALT 16 11  --  11 10  --   --   --   --   --   ALKPHOS 77 79  --  76 124  --   --   --   --   --   PROT 7.7 7.8  --  7.1 6.0*  --   --   --   --   --   ALBUMIN 3.0*  2.8*   < > 2.4* 1.9*  1.9*   < > 2.0* 1.8* 1.9* 1.9*   < > = values in this interval not displayed.    TUMOR MARKERS: No results for input(s): "AFPTM", "CEA", "CA199", "CHROMGRNA" in the last 8760 hours.  Assessment and Plan:  Splenic abscess - imaging reviewed by Dr. Maryelizabeth Kaufmann. He also discussed case with Dr.Lovick with General Surgery. She is available for backup in case of bleeding.   We will proceed with image guided drainage of the splenic abscess today as long as INR is ok (ordered STAT).  Risks and benefits of splenic abscess drainage discussed with the patient including bleeding requiring emergent splenectomy, infection, damage to adjacent structures, bowel perforation/fistula connection, and sepsis.  All of the patient's questions were answered, patient is agreeable to proceed. Consent signed and in chart.  Thank you for allowing our service to participate in Courtney Gray 's care.  Electronically Signed: Murrell Redden, PA-C   10/09/2021, 1:42 PM      I spent a total of    25 Minutes in face to face in clinical consultation, greater than 50% of which was counseling/coordinating care for Splenic abscess drainage.

## 2021-10-09 NOTE — Transfer of Care (Signed)
Immediate Anesthesia Transfer of Care Note  Patient: Courtney Gray  Procedure(s) Performed: LEFT ABOVE KNEE AMPUTATION (Left: Knee)  Patient Location: PACU  Anesthesia Type:General  Level of Consciousness: drowsy  Airway & Oxygen Therapy: Patient Spontanous Breathing and Patient connected to nasal cannula oxygen  Post-op Assessment: Report given to RN and Post -op Vital signs reviewed and stable  Post vital signs: Reviewed and stable  Last Vitals:  Vitals Value Taken Time  BP 149/79 10/09/21 1530  Temp    Pulse 81 10/09/21 1536  Resp 20 10/09/21 1536  SpO2 93 % 10/09/21 1536  Vitals shown include unvalidated device data.  Last Pain:  Vitals:   10/09/21 1351  TempSrc:   PainSc: 5       Patients Stated Pain Goal: 0 (14/43/60 1658)  Complications: No notable events documented.

## 2021-10-09 NOTE — TOC Progression Note (Signed)
Transition of Care [facility]) - Progression Note    Patient Details  Name: Courtney Gray MRN: [id number] Date of Birth: 1971-01-11  Transition of Care [facility]) CM/SW [place], [place] Phone Number: 10/09/2021, 11:57 AM  Clinical Narrative:     CSW called Christine at [facility] to check on status of LTC SNF placement referral. CSW LVM and awaiting callback. CSW will continue to follow and assist with patients dc planning needs.  Expected Discharge Plan: Skilled Nursing Facility Barriers to Discharge: Continued Medical Work up  Expected Discharge Plan and Services Expected Discharge Plan: [place] In-house Referral: Clinical Social Work Discharge Planning Services: CM Consult Post Acute Care Choice: Durable Medical Equipment Living arrangements for the past 2 months: Single Family Home                                       Social Determinants of Health (SDOH) Interventions    Readmission Risk Interventions    09/16/2021    4:36 PM 08/28/2021   11:50 AM 07/12/2021   12:38 PM  Readmission Risk Prevention Plan  Transportation Screening Complete Complete Complete  Medication Review [employer])  Complete Complete  PCP or Specialist appointment within 3-5 days of discharge  Complete Complete  HRI or Home Care Consult Complete Complete Complete  SW Recovery Care/Counseling Consult Complete Complete Complete  Palliative Care Screening  Not Applicable Not Applicable  Skilled Nursing Facility Complete Not Applicable Complete

## 2021-10-09 NOTE — Anesthesia Postprocedure Evaluation (Signed)
Anesthesia Post Note  Patient: Courtney Gray  Procedure(s) Performed: LEFT ABOVE KNEE AMPUTATION (Left: Knee)     Patient location during evaluation: PACU Anesthesia Type: General Level of consciousness: awake Pain management: pain level controlled Vital Signs Assessment: post-procedure vital signs reviewed and stable Respiratory status: spontaneous breathing, nonlabored ventilation, respiratory function stable and patient connected to nasal cannula oxygen Cardiovascular status: blood pressure returned to baseline and stable Postop Assessment: no apparent nausea or vomiting Anesthetic complications: no   No notable events documented.  Last Vitals:  Vitals:   10/09/21 1615 10/09/21 2010  BP: (!) 131/57   Pulse: 78   Resp: 13   Temp:    SpO2: 94% 96%    Last Pain:  Vitals:   10/09/21 2010  TempSrc:   PainSc: Asleep                 Kimra Kantor P Marabella Popiel

## 2021-10-09 NOTE — Progress Notes (Signed)
RT NOTE:  Pt refuses to wear BIPAP tonight. She stated " they keep telling me I can start it when I go home, so that is what I want to do". Pt has never had a sleep study or a diagnosis on file. RT explained to patient is she has any periods of apnea or desaturation that we will have to put her on. RN aware.

## 2021-10-09 NOTE — Progress Notes (Signed)
[facility] [place] PROGRESS NOTE  ASSESSMENT / PLAN: Courtney Gray is a 51 y.o. 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  Angiogram performed 08/07/2021 shows no options for revascularization of the left lower extremity. Left above-knee amputation.  We will plan to do this on Wednesday, 10/09/2021.  SUBJECTIVE: IN preop. No questions. Ready for OR.   OBJECTIVE: BP (!) 144/77   Pulse 81   Temp 98.7 F (37.1 C) (Oral)   Resp 20   Ht 5\' 5"  (1.651 m)   Wt (!) 153.7 kg   SpO2 100%   BMI 56.39 kg/m  No intake or output data in the 24 hours ending 10/09/21 1331   Constitutional: chronically ill. Cardiac: RRR. Pulmonary: unlabored Vascular: unchanged appearance L foot     Latest Ref Rng & Units 10/09/2021   12:50 AM 10/08/2021    5:06 AM 10/07/2021    5:20 AM  CBC  WBC 4.0 - 10.5 K/uL 21.3  20.7  19.7   Hemoglobin 12.0 - 15.0 g/dL 7.3  7.1  7.2   Hematocrit 36.0 - 46.0 % 25.6  23.9  24.6   Platelets 150 - 400 K/uL 307  306  298         Latest Ref Rng & Units 10/09/2021   12:50 AM 10/08/2021    5:06 AM 10/07/2021    5:20 AM  CMP  Glucose 70 - 99 mg/dL 127  86  77   BUN 6 - 20 mg/dL 18  26  20    Creatinine 0.44 - 1.00 mg/dL 4.44  6.14  5.11   Sodium 135 - 145 mmol/L 132  131  132   Potassium 3.5 - 5.1 mmol/L 3.6  4.0  3.9   Chloride 98 - 111 mmol/L 94  94  95   CO2 22 - 32 mmol/L 25  25  27    Calcium 8.9 - 10.3 mg/dL 8.3  8.4  8.1     Estimated Creatinine Clearance: 22.6 mL/min (A) (by C-G formula based on SCr of 4.44 mg/dL (H)).  Courtney Gray. Stanford Breed, MD [facility] Phone Number: [phone] 10/09/2021 1:31 PM

## 2021-10-09 NOTE — Op Note (Signed)
DATE OF SERVICE: 10/09/2021  PATIENT:  Courtney Gray  51 y.o. female  PRE-OPERATIVE DIAGNOSIS:  ischemic left foot without options for revascularization  POST-OPERATIVE DIAGNOSIS:  Same  PROCEDURE:   Left above knee amputation  SURGEON:  Surgeon(s) and Role:    * Cherre Robins, MD - Primary  ASSISTANT: Vicente Serene, PA-C  An experienced assistant was required given the complexity of this procedure and the standard of surgical care. My assistant helped with exposure through counter tension, suctioning, ligation and retraction to better visualize the surgical field.  My assistant expedited sewing during the case by following my sutures. Wherever I use the term "we" in the report, my assistant actively helped me with that portion of the procedure.  ANESTHESIA:   general  EBL: 369mL  BLOOD ADMINISTERED:none  DRAINS: none   LOCAL MEDICATIONS USED:  NONE  SPECIMEN:  none  COUNTS: confirmed correct.  TOURNIQUET:  none  PATIENT DISPOSITION:  PACU - hemodynamically stable.   Delay start of Pharmacological VTE agent (>24hrs) due to surgical blood loss or risk of bleeding: no  INDICATION FOR PROCEDURE: Courtney Gray is a 51 y.o. female with non-salvageable ischemic left foot with no options for revascularization. After careful discussion of risks, benefits, and alternatives the patient was offered left above knee amputation. The patient understood and wished to proceed.  OPERATIVE FINDINGS: obese thigh. Otherwise unremarkable above knee amputation.  DESCRIPTION OF PROCEDURE: After identification of the patient in the pre-operative holding area, the patient was transferred to the operating room. The patient was positioned supine on the operating room table. Anesthesia was induced. The left leg was prepped and draped in standard fashion. A surgical pause was performed confirming correct patient, procedure, and operative location.  A generous left thigh fishmouth incision was  planned on the left lower extremity with a skin marker. The incision was made as planned. The incision was carried down with a #21 scalpel through the subcutaneous tissue, and through the posterior and anterior compartments of the thigh until the femur was encountered. The periosteum about the femur was elevated as high as possible. The femur was transected with a powered saw. The vascular bundle was found in its typical position, and was suture-ligated proximally using a 2-0 silk suture.  Hemostasis was achieved in the surgical bed. The wound was copiously irrigated. The wound was closed in layers using 2-0 Vicryl, surgical stapler.  An incisional VAC was applied.  Upon completion of the case instrument and sharps counts were confirmed correct. The patient was transferred to the PACU in good condition. I was present for all portions of the procedure.  [name]. Stanford Breed, MD [facility] Phone Number: [phone] 10/09/2021 3:29 PM

## 2021-10-10 ENCOUNTER — Encounter (HOSPITAL_COMMUNITY): Payer: Self-pay | Admitting: Vascular Surgery

## 2021-10-10 DIAGNOSIS — D72829 Elevated white blood cell count, unspecified: Secondary | ICD-10-CM | POA: Diagnosis not present

## 2021-10-10 DIAGNOSIS — E1122 Type 2 diabetes mellitus with diabetic chronic kidney disease: Secondary | ICD-10-CM | POA: Diagnosis not present

## 2021-10-10 DIAGNOSIS — D735 Infarction of spleen: Secondary | ICD-10-CM | POA: Diagnosis not present

## 2021-10-10 DIAGNOSIS — N186 End stage renal disease: Secondary | ICD-10-CM | POA: Diagnosis not present

## 2021-10-10 LAB — HEMOGLOBIN AND HEMATOCRIT, BLOOD
HCT: 24 % — ABNORMAL LOW (ref 36.0–46.0)
Hemoglobin: 7.1 g/dL — ABNORMAL LOW (ref 12.0–15.0)

## 2021-10-10 LAB — GLUCOSE, CAPILLARY
Glucose-Capillary: 149 mg/dL — ABNORMAL HIGH (ref 70–99)
Glucose-Capillary: 170 mg/dL — ABNORMAL HIGH (ref 70–99)

## 2021-10-10 LAB — CBC
HCT: 22.9 % — ABNORMAL LOW (ref 36.0–46.0)
Hemoglobin: 6.6 g/dL — CL (ref 12.0–15.0)
MCH: 28.3 pg (ref 26.0–34.0)
MCHC: 28.8 g/dL — ABNORMAL LOW (ref 30.0–36.0)
MCV: 98.3 fL (ref 80.0–100.0)
Platelets: 310 10*3/uL (ref 150–400)
RBC: 2.33 MIL/uL — ABNORMAL LOW (ref 3.87–5.11)
RDW: 21.3 % — ABNORMAL HIGH (ref 11.5–15.5)
WBC: 31 10*3/uL — ABNORMAL HIGH (ref 4.0–10.5)
nRBC: 0.3 % — ABNORMAL HIGH (ref 0.0–0.2)

## 2021-10-10 LAB — RENAL FUNCTION PANEL
Albumin: 2 g/dL — ABNORMAL LOW (ref 3.5–5.0)
Anion gap: 12 (ref 5–15)
BUN: 25 mg/dL — ABNORMAL HIGH (ref 6–20)
CO2: 23 mmol/L (ref 22–32)
Calcium: 7.9 mg/dL — ABNORMAL LOW (ref 8.9–10.3)
Chloride: 95 mmol/L — ABNORMAL LOW (ref 98–111)
Creatinine, Ser: 5.89 mg/dL — ABNORMAL HIGH (ref 0.44–1.00)
GFR, Estimated: 8 mL/min — ABNORMAL LOW (ref >60)
Glucose, Bld: 193 mg/dL — ABNORMAL HIGH (ref 70–99)
Phosphorus: 6.7 mg/dL — ABNORMAL HIGH (ref 2.5–4.6)
Potassium: 4.9 mmol/L (ref 3.5–5.1)
Sodium: 130 mmol/L — ABNORMAL LOW (ref 135–145)

## 2021-10-10 LAB — HEPARIN LEVEL (UNFRACTIONATED): Heparin Unfractionated: <0.1 IU/mL — ABNORMAL LOW (ref 0.30–0.70)

## 2021-10-10 LAB — PREPARE RBC (CROSSMATCH)

## 2021-10-10 MED ORDER — HEPARIN (PORCINE) 25000 UT/250ML-% IV SOLN
1950.0000 [IU]/h | INTRAVENOUS | Status: DC
  Administered 2021-10-10 (×2): 1950 [IU]/h via INTRAVENOUS
  Filled 2021-10-10: qty 250

## 2021-10-10 MED ORDER — HYDROMORPHONE HCL 1 MG/ML IJ SOLN
INTRAMUSCULAR | Status: AC
  Filled 2021-10-10: qty 1

## 2021-10-10 MED ORDER — HEPARIN SODIUM (PORCINE) 1000 UNIT/ML IJ SOLN
INTRAMUSCULAR | Status: AC
  Administered 2021-10-10: 4000 [IU]
  Filled 2021-10-10: qty 4

## 2021-10-10 MED ORDER — SODIUM CHLORIDE 0.9% IV SOLUTION: Freq: Once | INTRAVENOUS | Status: AC

## 2021-10-10 NOTE — Progress Notes (Signed)
OT Cancellation Note  Patient Details Name: Courtney Gray MRN: [id number] DOB: Oct 01, 1970   Cancelled Treatment:    Reason Eval/Treat Not Completed: Patient at procedure or test/ unavailable (Pt in HD.)  [name] 10/10/2021, 8:32 AM Cleta Alberts, OTR/L [place] Office: [phone]

## 2021-10-10 NOTE — Progress Notes (Addendum)
  Progress Note    10/10/2021 7:23 AM 1 Day Post-Op  Subjective:  somnolent but complaining of pain in L AKA   Vitals:   10/09/21 2314 10/10/21 0304  BP: (!) 143/92 138/72  Pulse: 89 78  Resp: 18 18  Temp: 98.4 F (36.9 C) 98.9 F (37.2 C)  SpO2: (!) 81% 100%    Physical Exam: Incisions:  dressing left in place L AKA; vac with good seal   CBC    Component Value Date/Time   WBC 31.0 (H) 10/10/2021 0330   RBC 2.33 (L) 10/10/2021 0330   HGB 6.6 (LL) 10/10/2021 0330   HCT 22.9 (L) 10/10/2021 0330   PLT 310 10/10/2021 0330   MCV 98.3 10/10/2021 0330   MCH 28.3 10/10/2021 0330   MCHC 28.8 (L) 10/10/2021 0330   RDW 21.3 (H) 10/10/2021 0330   LYMPHSABS 1.0 09/28/2021 0933   MONOABS 1.3 (H) 09/28/2021 0933   EOSABS 0.0 09/28/2021 0933   BASOSABS 0.0 09/28/2021 0933    BMET    Component Value Date/Time   NA 130 (L) 10/10/2021 0330   K 4.9 10/10/2021 0330   CL 95 (L) 10/10/2021 0330   CO2 23 10/10/2021 0330   GLUCOSE 193 (H) 10/10/2021 0330   BUN 25 (H) 10/10/2021 0330   CREATININE 5.89 (H) 10/10/2021 0330   CALCIUM 7.9 (L) 10/10/2021 0330   GFRNONAA 8 (L) 10/10/2021 0330   GFRAA 6 (L) 11/29/2019 0120    INR    Component Value Date/Time   INR 1.3 (H) 10/09/2021 1219     Intake/Output Summary (Last 24 hours) at 10/10/2021 0723 Last data filed at 10/10/2021 0600 Gross per 24 hour  Intake 675 ml  Output 200 ml  Net 475 ml     Assessment/Plan:  51 y.o. female is s/p left above knee amputation  1 Day Post-Op  - Continue vac L AKA for at least 7 days - Pain medication on schedule today - Ok to mobilize - Patient will have pRBC transfusion while in HD   Dagoberto Ligas, [place] [facility] [phone] 10/10/2021 7:23 AM   VASCULAR STAFF ADDENDUM: I agree with the above.   [name]. Stanford Breed, MD [facility] Phone Number: [phone] 10/10/2021 4:20 PM

## 2021-10-10 NOTE — Progress Notes (Signed)
PT Cancellation Note  Patient Details Name: Courtney Gray MRN: [id number] DOB: Mar 02, 1971   Cancelled Treatment:    Reason Eval/Treat Not Completed: (P) Patient at procedure or test/unavailable Pt off floor for HD. PT will follow back this afternoon as able.  Jemma Rasp B. Migdalia Dk PT, DPT Acute Rehabilitation Services Please use secure chat or  Call Office [phone]    [place] 10/10/2021, 8:36 AM

## 2021-10-10 NOTE — Progress Notes (Signed)
[facility]                                                                              Courtney Gray, is a 51 y.o. female, DOB - October 24, 1970, FTD:[id number] Admit date - 09/09/2021    Outpatient Primary MD for the patient is Monico Blitz, MD  LOS - 30  days  Chief Complaint  Patient presents with   Nausea   Constipation       Brief summary   Courtney Gray is a 51 y.o. 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 She was hypoglycemic, febrile with CT revealing hypoenhancement in the spleen consistent with infarct. Started on IV heparin, empiric antibiotics for septic shock of unclear origin. Later taken for left middle finger amputation 6/19, Ultimately was taken for TEE 6/23 which revealed PFO without vegetation and required transfer to ICU for septic shock found to have hepatic and splenic abscesses. ID has guided IV antibiotics, IR placed hepatic abscess drain 6/25.    Assessment & Plan   Septic shock Bacteremia bacteroids thetaiotaomicron-beta-lactamase positive on 6/17: -Drain management per IR -Continue IV cefepime with dialysis and oral metronidazole -ID following  Hepatic and splenic abscess -Status post CT-guided drainage of liver abscess by IR on 6/25 with purulent fluid --CT abdomen pelvis repeated on 7/3 show near completion of previously seen hepatic fluid abscess. Large bilobed subcapsular fluid collection 10.7 cm still persisting.  Small volume perihepatic ascites, small to moderate size loculated left pleural effusion -Continue IV cefepime, Flagyl -IR reconsulted as a subcapsular collection drainage persisting and leukocytosis.  ID requesting surgery to reevaluate  -plan per IR/gen surg/ID  Splenic infarct -No embolic source noted on  echo or CT. -TEE on 6/23 showed normal LV function, negative for endocarditis.  Calcified mass in the right atrium-etiology unclear, possibly calcified thrombus. -eliquis was placed on hold due to subcapsular hematoma 5.9x10x8.9, continue IV heparin -Continue to hold Eliquis  Left third digit ischemic gangrene Severe PAD with prior right AKA New left first and second toe gangrene - amputation of left third digit on 6/19 by hand surgery S/p left aka on 7/5, + wound vac for at least 7 days -Continue pain control, aspirin, statin  Diabetes mellitus type 2, insulin-dependent, uncontrolled with hypoglycemia -Hemoglobin A1c 5.7% -CBGs controlled, continue current regimen Recent Labs    10/07/21 1624 10/08/21 2116 10/09/21 0951 10/09/21 1323 10/09/21 1536 10/10/21 0752  GLUCAP 154* 176* 93 92 90 149*    HTN  Initially was hypotensive, requiring pressors, midodrine -currently bp stable on amlodipine, low-dose Coreg -with brief hypotension  likely sedation related, monitor   Acute on chronic respiratory failure with hypoxia Suspected OHS, OSA -Continue O2, 2 to 3 L via [place], at baseline -will benefit from outpatient sleep study   ESRD on hemodialysis, TTS -HD per nephrology   Anemia, multifactorial -Likely due to CKD, also had menorrhagia, now resolved -Status post 2 unit packed  RBCs 5/23, 6/22 and 7/6 with HD -Continue Aranesp.   -Hemoglobin 7.1, follow H&H in a.m., transfuse if less than 7,  Menorrhagia -Resolved, Megace discontinued  Demand ischemia with elevated troponin in the setting of anemia, sepsis -ACS ruled out.  Currently no chest pain.    Hyperlipidemia  -Continue statin   GERD  -continue PPI  Acute on chronic ambulatory dysfunction -Continue PT OT, recommended SNF    Pressure ulcer stage II left and right buttock POA -Continue local wound care.  Change position every 2 hours  Morbid obesity Estimated body mass index is 56.39 kg/m as calculated from  the following:   Height as of this encounter: 5\' 5"  (1.651 m).   Weight as of this encounter: 153.7 kg.  OSA? Reports Have not done sleep study , wearing oxygen at home  FTT: Frequent hospitalizations, 6th admissions this year  Code Status: Full code DVT Prophylaxis:  SCD's Start: 10/09/21 1713  Currently on heparin drip  Level of Care: Level of care: Med-Surg Family Communication: Updated patient's daughter at the bedside on 10/06/2021.  Requested SNF when patient is medically ready   Disposition Plan:      Remains inpatient appropriate: On IV antibiotics, work-up in process  Consultants:   Nephrology Infectious disease Interventional radiology General surgery Vascular surgery Hand surgery Critical care Wound care  Antimicrobials:   Anti-infectives (From admission, onward)    Start     Dose/Rate Route Frequency Ordered Stop   09/25/21 0915  metroNIDAZOLE (FLAGYL) IVPB 500 mg        500 mg 100 mL/hr over 60 Minutes Intravenous Every 12 hours 09/25/21 0822     09/13/21 1000  vancomycin (VANCOCIN) IVPB 1000 mg/200 mL premix        1,000 mg 200 mL/hr over 60 Minutes Intravenous STAT 09/13/21 0904 09/13/21 1140   09/10/21 1200  vancomycin (VANCOCIN) IVPB 1000 mg/200 mL premix  Status:  Discontinued        1,000 mg 200 mL/hr over 60 Minutes Intravenous Every T-Th-Sa (Hemodialysis) 09/10/21 0620 09/17/21 1013   09/10/21 1200  ceFEPIme (MAXIPIME) 2 g in sodium chloride 0.9 % 100 mL IVPB        2 g 200 mL/hr over 30 Minutes Intravenous Every T-Th-Sa (Hemodialysis) 09/10/21 1107     09/10/21 0445  vancomycin (VANCOREADY) IVPB 2000 mg/400 mL        2,000 mg 200 mL/hr over 120 Minutes Intravenous  Once 09/10/21 0435 09/10/21 0800   09/10/21 0430  ceFEPIme (MAXIPIME) 2 g in sodium chloride 0.9 % 100 mL IVPB        2 g 200 mL/hr over 30 Minutes Intravenous  Once 09/10/21 0420 09/10/21 0550          Medications  amLODipine  10 mg Oral Daily   aspirin EC  81 mg Oral Daily    atorvastatin  10 mg Oral QHS   calcitRIOL  1 mcg Oral Q T,Th,Sa-HD   carvedilol  3.125 mg Oral BID WC   cinacalcet  30 mg Oral Q T,Th,Sa-HD   darbepoetin (ARANESP) injection - DIALYSIS  200 mcg Intravenous Q Sat-HD   diclofenac Sodium  2 g Topical QID   docusate sodium  100 mg Oral Daily   feeding supplement  1 Container Oral TID BM   gabapentin  100 mg Oral Q12H   gabapentin  200 mg Oral Q T,Th,Sa-HD   Gerhardt's butt cream   Topical BID   insulin aspart  1-3 Units Subcutaneous TID  WC   metoCLOPramide (REGLAN) injection  5 mg Intravenous Q6H   multivitamin  1 tablet Oral QHS   pantoprazole  40 mg Oral BID   polyethylene glycol  17 g Oral Daily   sodium chloride flush  10-40 mL Intracatheter Q12H   sodium chloride flush  3 mL Intravenous Q12H   sodium chloride flush  5 mL Intracatheter Q8H      Subjective:   Ron Junco was seen and examined.    Underwent left AKA yesterday Hgb dropped, suspect surgical wound loss with baseline anemia of chronic disease, denies menorrhagia, denies blood in stool, getting prbc transfusion,  She does not want to has a drain placed today  Bp stable  She received enema due to constipation ( no bm for 10 day), not she request Imodium due to frequent stooling     She is on 4liter oxygen , which is her baseline     Objective:   Vitals:   10/09/21 2010 10/09/21 2208 10/09/21 2314 10/10/21 0304  BP:  128/86 (!) 143/92 138/72  Pulse:  90 89 78  Resp:  16 18 18   Temp:  99.3 F (37.4 C) 98.4 F (36.9 C) 98.9 F (37.2 C)  TempSrc:  Oral Oral Oral  SpO2: 96% 97% (!) 81% 100%  Weight:      Height:        Intake/Output Summary (Last 24 hours) at 10/10/2021 0756 Last data filed at 10/10/2021 0600 Gross per 24 hour  Intake 675 ml  Output 200 ml  Net 475 ml      Wt Readings from Last 3 Encounters:  10/09/21 (!) 153.7 kg  09/03/21 (!) 150.6 kg  08/28/21 (!) 150.1 kg   Physical Exam General: Alert and oriented x 3,  NAD Cardiovascular: S1 S2 clear, RRR.  Respiratory: Diminished breath sound at the bases Gastrointestinal: Soft, nontender,  NBS, drain+ Ext: right AKA, left aka Neuro: no new deficits Skin: Left first and second toe gangrene Psych: Normal affect and demeanor, alert and oriented x3     Data Reviewed:  I have personally reviewed following labs    CBC Lab Results  Component Value Date   WBC 31.0 (H) 10/10/2021   RBC 2.33 (L) 10/10/2021   HGB 6.6 (LL) 10/10/2021   HCT 22.9 (L) 10/10/2021   MCV 98.3 10/10/2021   MCH 28.3 10/10/2021   PLT 310 10/10/2021   MCHC 28.8 (L) 10/10/2021   RDW 21.3 (H) 10/10/2021   LYMPHSABS 1.0 09/28/2021   MONOABS 1.3 (H) 09/28/2021   EOSABS 0.0 09/28/2021   BASOSABS 0.0 28/41/3244     Last metabolic panel Lab Results  Component Value Date   NA 130 (L) 10/10/2021   K 4.9 10/10/2021   CL 95 (L) 10/10/2021   CO2 23 10/10/2021   BUN 25 (H) 10/10/2021   CREATININE 5.89 (H) 10/10/2021   GLUCOSE 193 (H) 10/10/2021   GFRNONAA 8 (L) 10/10/2021   GFRAA 6 (L) 11/29/2019   CALCIUM 7.9 (L) 10/10/2021   PHOS 6.7 (H) 10/10/2021   PROT 6.0 (L) 09/14/2021   ALBUMIN 2.0 (L) 10/10/2021   BILITOT 0.7 09/14/2021   ALKPHOS 124 09/14/2021   AST 16 09/14/2021   ALT 10 09/14/2021   ANIONGAP 12 10/10/2021    CBG (last 3)  Recent Labs    10/09/21 1323 10/09/21 1536 10/10/21 0752  GLUCAP 92 90 149*       Florencia Reasons M.D. PhD FACP [facility] 10/10/2021, 7:56 AM  Available via  Epic secure chat 7am-7pm After 7 pm, please refer to night coverage provider listed on amion.

## 2021-10-10 NOTE — Procedures (Signed)
I was present at this dialysis session. I have reviewed the session itself and made appropriate changes.   Vital signs in last 24 hours:  Temp:  [98.4 F (36.9 C)-99.3 F (37.4 C)] 98.5 F (36.9 C) (07/06 0822) Pulse Rate:  [78-90] 83 (07/06 0822) Resp:  [12-20] 12 (07/06 0822) BP: (121-149)/(56-92) 121/75 (07/06 0822) SpO2:  [81 %-100 %] 100 % (07/06 0822) Weight:  [153.7 kg] 153.7 kg (07/05 1321) Weight change: 0 kg Filed Weights   10/08/21 0635 10/08/21 1033 10/09/21 1321  Weight: (!) 157 kg (!) 153.7 kg (!) 153.7 kg    Recent Labs  Lab 10/10/21 0330  NA 130*  K 4.9  CL 95*  CO2 23  GLUCOSE 193*  BUN 25*  CREATININE 5.89*  CALCIUM 7.9*  PHOS 6.7*    Recent Labs  Lab 10/08/21 0506 10/09/21 0050 10/09/21 1342 10/10/21 0330  WBC 20.7* 21.3*  --  31.0*  HGB 7.1* 7.3* 9.5* 6.6*  HCT 23.9* 25.6* 28.0* 22.9*  MCV 96.8 99.6  --  98.3  PLT 306 307  --  310    Scheduled Meds:  amLODipine  10 mg Oral Daily   aspirin EC  81 mg Oral Daily   atorvastatin  10 mg Oral QHS   calcitRIOL  1 mcg Oral Q T,Th,Sa-HD   carvedilol  3.125 mg Oral BID WC   cinacalcet  30 mg Oral Q T,Th,Sa-HD   darbepoetin (ARANESP) injection - DIALYSIS  200 mcg Intravenous Q Sat-HD   diclofenac Sodium  2 g Topical QID   docusate sodium  100 mg Oral Daily   feeding supplement  1 Container Oral TID BM   gabapentin  100 mg Oral Q12H   gabapentin  200 mg Oral Q T,Th,Sa-HD   Gerhardt's butt cream   Topical BID   insulin aspart  1-3 Units Subcutaneous TID WC   metoCLOPramide (REGLAN) injection  5 mg Intravenous Q6H   multivitamin  1 tablet Oral QHS   pantoprazole  40 mg Oral BID   polyethylene glycol  17 g Oral Daily   sodium chloride flush  10-40 mL Intracatheter Q12H   sodium chloride flush  3 mL Intravenous Q12H   sodium chloride flush  5 mL Intracatheter Q8H   Continuous Infusions:  sodium chloride 10 mL/hr at 10/09/21 1410   ceFEPime (MAXIPIME) IV Stopped (10/08/21 2144)   magnesium  sulfate bolus IVPB     metronidazole 500 mg (10/09/21 2031)   PRN Meds:.acetaminophen **OR** acetaminophen, albuterol, alum & mag hydroxide-simeth, bisacodyl, diphenhydrAMINE, glucagon (human recombinant), glucose, guaiFENesin-dextromethorphan, hydrALAZINE, HYDROmorphone (DILAUDID) injection, HYDROmorphone, labetalol, lactulose, lidocaine (PF), lidocaine-prilocaine, loperamide, magnesium sulfate bolus IVPB, naLOXone (NARCAN)  injection, nitroGLYCERIN, ondansetron **OR** ondansetron (ZOFRAN) IV, oxyCODONE, oxyCODONE, pentafluoroprop-tetrafluoroeth, phenol, polyethylene glycol, potassium chloride, prochlorperazine, senna-docusate   Donetta Potts,  MD 10/10/2021, 8:49 AM

## 2021-10-10 NOTE — Progress Notes (Signed)
[place] for heparin Indication:  splenic infarct  Height: 5 ft 5 inches Weight : 150.4 kg Heparin Dosing Weight: 95 kg  Labs: Recent Labs    10/08/21 0506 10/08/21 1600 10/09/21 0050 10/09/21 1219 10/09/21 1342 10/10/21 0330 10/10/21 0936 10/10/21 1615  HGB 7.1*  --  7.3*  --  9.5* 6.6*  --  7.1*  HCT 23.9*  --  25.6*  --  28.0* 22.9*  --  24.0*  PLT 306  --  307  --   --  310  --   --   LABPROT  --   --   --  16.5*  --   --   --   --   INR  --   --   --  1.3*  --   --   --   --   HEPARINUNFRC 0.52 0.28* 0.30  --   --   --  <0.10*  --   CREATININE 6.14*  --  4.44*  --  5.50* 5.89*  --   --      Assessment: 53 yof with hx PVD s/p R AKA 5/10 for critical limb ischemia, ESRD on HD TTS, found to have splenic infarct and transitioned from heparin infusion to apixaban on 6/14. Pharmacy consulted to transition back to heparin infusion with patient hypotensive requiring transfer to ICU s/p TEE on 6/23. Last dose of apixaban 6/23 at 1048. She is s/p IR drain placement for hepatic abscess and 6/23 noted CT finding of possible subcapsular hematoma so heparin goal was lowered.   POD # 1 s/p Left AKA  Heparin restarted at 0122 this AM after Left AKA yesterday 7/5.  Hgb at 0330  (10/10/21) was 6.6 with previous Hgb 9.5 (OR blood loss?). Discussed with Dr. Erlinda Hong, who ordered to hold heparin this AM.  >  now  this afternoon repeat Hgb has improved to 7.1. No bleeding reported.  I spoke to Dr. Florencia Reasons who gave order to  resume Heparin infusion, no bolus.  PLAN:    Resume heparin drip 1950 units/hr (reduced rate from previous dosing due to low Hgb s/p Left AKA POD #1).  F/u 8 hr HL Daily HL and CBC..   Goal of Therapy:  Heparin level 0.3-0.5 units/ml Monitor platelets by anticoagulation protocol: Yes   Plan:  Hold heparin drip 1950 units/hr  F/u restart Monitor daily heparin level, CBC Monitor for signs/symptoms of bleeding   Dwayne A. Levada Dy,  PharmD, BCPS, FNKF Clinical Pharmacist [place] Please utilize Amion for appropriate phone number to reach the unit pharmacist ([place])

## 2021-10-10 NOTE — Progress Notes (Signed)
[place] for Infectious Disease  Date of Admission:  09/09/2021     Principal Problem:   Splenic infarction Active Problems:   Uncontrolled type 2 diabetes mellitus with hypoglycemia, with long-term current use of insulin (HCC)   Essential hypertension   ESRD (end stage renal disease) on dialysis (HCC)   Chronic respiratory failure with hypoxia (HCC)   Leukocytosis   Dry gangrene (HCC)   Pneumonia of left lower lobe due to infectious organism   Lactic acidosis   Mixed diabetic hyperlipidemia associated with type 2 diabetes mellitus (HCC)   Elevated troponin level not due myocardial infarction   Hypotension   PVD (peripheral vascular disease) (HCC)   Hx of AKA (above knee amputation), right (HCC)   Pressure injury of skin   Hepatic abscess          Assessment: 46 YF admitted with:  #Hepatic abscess status post drain placement-resolved #Subcapsular fluid collection/splenic infarcts-increase in size #Bacteroides bacteremia on 6/17(cleared on 6/23) 2/2 CLABSI vs  intraabdominal abscess #Persistent leukocytosis #Peripheral gangrene(left second toe) SP left AKA on 7/5 #ESRD on IHD via RIJ -RIJ placed on 6/7, failed avf -Patient had drain placed hepatic abscess(CT notes hepatic collection on 6/23), cultures negative. -CT on 7/3 showed complete resolution of hepatic collection/abscess.  Large bilobed subcapsular fluid collection in spleen measuring 10.7 cm small-to-moderate loculated left pleural effusion.  Perinephric stranding and fluid associated with both kidneys is increased - Etiology of back leukocytosis unclear although increased in size.  Of subcapsular fluid collection may be contributing(10->10.7cm).  Also has second toe gangrene, AP AKA,  TEE showed no vegetation. Recommendations: -Needs splenic abscess drained, engage IR/gen surg. She noted she wanted to avoid procedure today as she has LLE pain.  -Continue cefepime and metronidazole -Line removal (RIJ  placed on 6/7) as pt continues to have leukocytosis  Microbiology:   Antibiotics: Cefepime and metronidazole  Cultures: Blood 6/25 1/2 bacteroides thetaiotaomicron  Other 6/17 hepatic abscess Cx NG 6/25 NG  SUBJECTIVE: Resting in bed. She reports she is pain and does not want the drain placed today.  Interval:wbc 31k. afebrile overnight.   Review of Systems: Review of Systems  All other systems reviewed and are negative.    Scheduled Meds:  amLODipine  10 mg Oral Daily   aspirin EC  81 mg Oral Daily   atorvastatin  10 mg Oral QHS   calcitRIOL  1 mcg Oral Q T,Th,Sa-HD   carvedilol  3.125 mg Oral BID WC   cinacalcet  30 mg Oral Q T,Th,Sa-HD   darbepoetin (ARANESP) injection - DIALYSIS  200 mcg Intravenous Q Sat-HD   diclofenac Sodium  2 g Topical QID   docusate sodium  100 mg Oral Daily   feeding supplement  1 Container Oral TID BM   gabapentin  100 mg Oral Q12H   gabapentin  200 mg Oral Q T,Th,Sa-HD   Gerhardt's butt cream   Topical BID   insulin aspart  1-3 Units Subcutaneous TID WC   metoCLOPramide (REGLAN) injection  5 mg Intravenous Q6H   multivitamin  1 tablet Oral QHS   pantoprazole  40 mg Oral BID   polyethylene glycol  17 g Oral Daily   sodium chloride flush  10-40 mL Intracatheter Q12H   sodium chloride flush  3 mL Intravenous Q12H   sodium chloride flush  5 mL Intracatheter Q8H   Continuous Infusions:  sodium chloride 10 mL/hr at 10/09/21 1410   ceFEPime (MAXIPIME) IV  2 g (10/10/21 1530)   heparin 1,950 Units/hr (10/10/21 1828)   magnesium sulfate bolus IVPB     metronidazole 500 mg (10/10/21 1823)   PRN Meds:.acetaminophen **OR** acetaminophen, albuterol, alum & mag hydroxide-simeth, bisacodyl, diphenhydrAMINE, glucagon (human recombinant), glucose, guaiFENesin-dextromethorphan, hydrALAZINE, HYDROmorphone (DILAUDID) injection, HYDROmorphone, labetalol, lactulose, lidocaine (PF), lidocaine-prilocaine, loperamide, magnesium sulfate bolus IVPB, naLOXone  (NARCAN)  injection, nitroGLYCERIN, ondansetron **OR** ondansetron (ZOFRAN) IV, oxyCODONE, oxyCODONE, pentafluoroprop-tetrafluoroeth, phenol, polyethylene glycol, potassium chloride, prochlorperazine, senna-docusate Allergies  Allergen Reactions   Benzonatate Other (See Comments)    Hallucinations   Hydralazine Other (See Comments)    Hallucinations   Amoxicillin Itching and Nausea And Vomiting   Clonidine Other (See Comments)    Altered mental state, insomnia    Chlorhexidine Itching    Patient reports it is the CHG baths that cause her itching not the CHG used for caring for her IV/HD access.     Morphine Itching   Oxycodone Itching    Pt tolerated hospital admission 07/2021    OBJECTIVE: Vitals:   10/10/21 1310 10/10/21 1315 10/10/21 1701 10/10/21 2106  BP: 107/78  (!) 127/57 (!) 100/51  Pulse: 82  88 83  Resp: 10  19 18   Temp: 98.2 F (36.8 C)  98.2 F (36.8 C) 98.9 F (37.2 C)  TempSrc: Oral     SpO2: 100%  100% 100%  Weight:  (!) 150.4 kg    Height:       Body mass index is 55.18 kg/m.  Physical Exam Constitutional:      Appearance: Normal appearance.  HENT:     Head: Normocephalic and atraumatic.     Right Ear: Tympanic membrane normal.     Left Ear: Tympanic membrane normal.     Nose: Nose normal.     Mouth/Throat:     Mouth: Mucous membranes are moist.  Eyes:     Extraocular Movements: Extraocular movements intact.     Conjunctiva/sclera: Conjunctivae normal.     Pupils: Pupils are equal, round, and reactive to light.  Cardiovascular:     Rate and Rhythm: Normal rate and regular rhythm.     Heart sounds: No murmur heard.    No friction rub. No gallop.  Pulmonary:     Effort: Pulmonary effort is normal.     Breath sounds: Normal breath sounds.  Abdominal:     General: Abdomen is flat.     Palpations: Abdomen is soft.  Musculoskeletal:        General: Normal range of motion.     Comments: Left middle finger wound Left AKA  Skin:    General: Skin  is warm and dry.  Neurological:     General: No focal deficit present.     Mental Status: She is alert and oriented to person, place, and time.  Psychiatric:        Mood and Affect: Mood normal.       Lab Results Lab Results  Component Value Date   WBC 31.0 (H) 10/10/2021   HGB 7.1 (L) 10/10/2021   HCT 24.0 (L) 10/10/2021   MCV 98.3 10/10/2021   PLT 310 10/10/2021    Lab Results  Component Value Date   CREATININE 5.89 (H) 10/10/2021   BUN 25 (H) 10/10/2021   NA 130 (L) 10/10/2021   K 4.9 10/10/2021   CL 95 (L) 10/10/2021   CO2 23 10/10/2021    Lab Results  Component Value Date   ALT 10 09/14/2021   AST 16 09/14/2021  ALKPHOS 124 09/14/2021   BILITOT 0.7 09/14/2021        Laurice Record, [place] for Infectious Disease [facility] 10/10/2021, 10:15 PM

## 2021-10-10 NOTE — Progress Notes (Signed)
[place] for heparin Indication:  splenic infarct  Heparin Dosing Weight: 95 kg  Labs: Recent Labs    10/08/21 0506 10/08/21 1600 10/09/21 0050 10/09/21 1219 10/09/21 1342 10/10/21 0330  HGB 7.1*  --  7.3*  --  9.5* 6.6*  HCT 23.9*  --  25.6*  --  28.0* 22.9*  PLT 306  --  307  --   --  310  LABPROT  --   --   --  16.5*  --   --   INR  --   --   --  1.3*  --   --   HEPARINUNFRC 0.52 0.28* 0.30  --   --   --   CREATININE 6.14*  --  4.44*  --  5.50* 5.89*     Assessment: 74 yof with hx PVD s/p R AKA 5/10 for critical limb ischemia, ESRD on HD TTS, found to have splenic infarct and transitioned from heparin infusion to apixaban on 6/14. Pharmacy consulted to transition back to heparin infusion with patient hypotensive requiring transfer to ICU s/p TEE on 6/23. Last dose of apixaban 6/23 at 1048. She is s/p IR drain placement for hepatic abscess and 6/23 noted CT finding of possible subcapsular hematoma so heparin goal was lowered.  Heparin restarted at 0122 this AM after Left AKA yesterday. Hgb  at 0330 was 6.6 with previous Hgb 9.5 (OR blood loss?). Discussed with Dr. Erlinda Hong, will hold heparin at this time.   Goal of Therapy:  Heparin level 0.3-0.5 units/ml Monitor platelets by anticoagulation protocol: Yes   Plan:  Hold heparin drip F/u restart Monitor daily heparin level, CBC Monitor for signs/symptoms of bleeding   Caulin Begley A. Levada Dy, PharmD, BCPS, FNKF Clinical Pharmacist [place] Please utilize Amion for appropriate phone number to reach the unit pharmacist ([place])

## 2021-10-11 ENCOUNTER — Inpatient Hospital Stay (HOSPITAL_COMMUNITY): Payer: 59

## 2021-10-11 DIAGNOSIS — D735 Infarction of spleen: Secondary | ICD-10-CM | POA: Diagnosis not present

## 2021-10-11 LAB — CBC
HCT: 23.1 % — ABNORMAL LOW (ref 36.0–46.0)
Hemoglobin: 6.7 g/dL — CL (ref 12.0–15.0)
MCH: 28.5 pg (ref 26.0–34.0)
MCHC: 29 g/dL — ABNORMAL LOW (ref 30.0–36.0)
MCV: 98.3 fL (ref 80.0–100.0)
Platelets: 242 10*3/uL (ref 150–400)
RBC: 2.35 MIL/uL — ABNORMAL LOW (ref 3.87–5.11)
RDW: 22.4 % — ABNORMAL HIGH (ref 11.5–15.5)
WBC: 30.3 10*3/uL — ABNORMAL HIGH (ref 4.0–10.5)
nRBC: 0.3 % — ABNORMAL HIGH (ref 0.0–0.2)

## 2021-10-11 LAB — RENAL FUNCTION PANEL
Albumin: 1.9 g/dL — ABNORMAL LOW (ref 3.5–5.0)
Anion gap: 10 (ref 5–15)
BUN: 20 mg/dL (ref 6–20)
CO2: 28 mmol/L (ref 22–32)
Calcium: 7.8 mg/dL — ABNORMAL LOW (ref 8.9–10.3)
Chloride: 96 mmol/L — ABNORMAL LOW (ref 98–111)
Creatinine, Ser: 4.33 mg/dL — ABNORMAL HIGH (ref 0.44–1.00)
GFR, Estimated: 12 mL/min — ABNORMAL LOW (ref >60)
Glucose, Bld: 160 mg/dL — ABNORMAL HIGH (ref 70–99)
Phosphorus: 4.6 mg/dL (ref 2.5–4.6)
Potassium: 4 mmol/L (ref 3.5–5.1)
Sodium: 134 mmol/L — ABNORMAL LOW (ref 135–145)

## 2021-10-11 LAB — HEPARIN LEVEL (UNFRACTIONATED): Heparin Unfractionated: 0.34 IU/mL (ref 0.30–0.70)

## 2021-10-11 LAB — HEMOGLOBIN AND HEMATOCRIT, BLOOD
HCT: 24.7 % — ABNORMAL LOW (ref 36.0–46.0)
Hemoglobin: 7.3 g/dL — ABNORMAL LOW (ref 12.0–15.0)

## 2021-10-11 LAB — HEPATITIS B SURFACE ANTIBODY, QUANTITATIVE: Hep B S AB Quant (Post): 12 m[IU]/mL (ref >9.9)

## 2021-10-11 LAB — PREPARE RBC (CROSSMATCH)

## 2021-10-11 LAB — GLUCOSE, CAPILLARY
Glucose-Capillary: 120 mg/dL — ABNORMAL HIGH (ref 70–99)
Glucose-Capillary: 110 mg/dL — ABNORMAL HIGH (ref 70–99)
Glucose-Capillary: 248 mg/dL — ABNORMAL HIGH (ref 70–99)

## 2021-10-11 MED ORDER — MIDAZOLAM HCL 2 MG/2ML IJ SOLN
INTRAMUSCULAR | Status: AC
  Filled 2021-10-11: qty 6

## 2021-10-11 MED ORDER — LIDOCAINE HCL 1 % IJ SOLN
INTRAMUSCULAR | Status: AC
  Filled 2021-10-11: qty 10

## 2021-10-11 MED ORDER — MIDAZOLAM HCL 2 MG/2ML IJ SOLN
INTRAMUSCULAR | Status: DC | PRN
  Administered 2021-10-11 (×3): 1 mg via INTRAVENOUS

## 2021-10-11 MED ORDER — FENTANYL CITRATE (PF) 100 MCG/2ML IJ SOLN
INTRAMUSCULAR | Status: AC
  Filled 2021-10-11: qty 6

## 2021-10-11 MED ORDER — FENTANYL CITRATE (PF) 100 MCG/2ML IJ SOLN
INTRAMUSCULAR | Status: DC | PRN
  Administered 2021-10-11 (×2): 50 ug via INTRAVENOUS

## 2021-10-11 MED ORDER — SODIUM CHLORIDE 0.9% IV SOLUTION: Freq: Once | INTRAVENOUS | Status: AC

## 2021-10-11 NOTE — Procedures (Signed)
Pre procedural Dx: Splenic abscess Post procedural Dx: Same  Technically successful [place] and CT guided placed of a 12 Fr drainage catheter placement into the splenic abscess yielding 350 cc of purulent fluid.    A representative aspirated sample was capped and sent to the laboratory for analysis.    EBL: Trace Complications: None immediate  Ronny Bacon, MD Pager #: [phone]

## 2021-10-11 NOTE — Progress Notes (Signed)
  Progress Note    10/11/2021 8:08 AM 2 Days Post-Op  Subjective:  pain L AKA   Vitals:   10/10/21 2106 10/11/21 0547  BP: (!) 100/51 133/78  Pulse: 83 82  Resp: 18 16  Temp: 98.9 F (37.2 C) 97.8 F (36.6 C)  SpO2: 100%    Physical Exam: Lungs:  non labored with O2 by [place] Incisions:  L AKA with incisional vac, good seal, minimal output Neurologic: A&O  CBC    Component Value Date/Time   WBC 30.3 (H) 10/11/2021 0503   RBC 2.35 (L) 10/11/2021 0503   HGB 6.7 (LL) 10/11/2021 0503   HCT 23.1 (L) 10/11/2021 0503   PLT 242 10/11/2021 0503   MCV 98.3 10/11/2021 0503   MCH 28.5 10/11/2021 0503   MCHC 29.0 (L) 10/11/2021 0503   RDW 22.4 (H) 10/11/2021 0503   LYMPHSABS 1.0 09/28/2021 0933   MONOABS 1.3 (H) 09/28/2021 0933   EOSABS 0.0 09/28/2021 0933   BASOSABS 0.0 09/28/2021 0933    BMET    Component Value Date/Time   NA 134 (L) 10/11/2021 0503   K 4.0 10/11/2021 0503   CL 96 (L) 10/11/2021 0503   CO2 28 10/11/2021 0503   GLUCOSE 160 (H) 10/11/2021 0503   BUN 20 10/11/2021 0503   CREATININE 4.33 (H) 10/11/2021 0503   CALCIUM 7.8 (L) 10/11/2021 0503   GFRNONAA 12 (L) 10/11/2021 0503   GFRAA 6 (L) 11/29/2019 0120    INR    Component Value Date/Time   INR 1.3 (H) 10/09/2021 1219     Intake/Output Summary (Last 24 hours) at 10/11/2021 0808 Last data filed at 10/11/2021 0600 Gross per 24 hour  Intake 1380.9 ml  Output 3404.5 ml  Net -2023.6 ml     Assessment/Plan:  51 y.o. female is s/p L AKA 2 Days Post-Op   Continue L AKA vac for about 10 days Plans noted for splenic abscess drainage by IR Call vascular with questions over the weekend   Dagoberto Ligas, PA-C [facility] [phone] 10/11/2021 8:08 AM

## 2021-10-11 NOTE — Progress Notes (Addendum)
OT Cancellation Note  Patient Details Name: Courtney Gray MRN: [id number] DOB: [birth year]   Cancelled Treatment:    Reason Eval/Treat Not Completed: Patient not medically ready;Medical issues which prohibited therapy: Noted pt now s/p LT AKA on 10/09/21. No orders to resume therapy and Hbg 6.7 with orders for transfusion. Will hold OT for today and continue to monitor.   OT cleared by MD to see pt, and OT did attempt visit at 1158 however pt working with RN and then MD into room and pt and family member giving multiple questions. OT waited about 8 min but pt still with questions for MD, therefore OT excused self from room. Will continue efforts.   Julien Girt 10/11/2021, 8:30 AM

## 2021-10-11 NOTE — Progress Notes (Signed)
Physical Therapy Re-Evaluation and Treatment Patient Details Name: Courtney Gray MRN: [id number] DOB: July 30, 1970 Today's Date: 10/11/2021   History of Present Illness 51 y.o. female admitted 09/09/21 with severe LUQ abdominal pain, hypoglycemia. Pt with splenic infarct, shock, L 3rd finger gangrene, s/p amputation. 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    PT Comments    Pt is supine in bed, daughter in room. Pt daughter asks "How long is this gonna take? She has had so many procedures." Pt reports she will work with therapy because she does not want PT to report she refused therapy. Pt only agreeable to limited UE movement. Pt able to pull herself to longsitting with increased use of L UE on bedrail, unable to maintain. Pt noted to be in regular hospital bed laying flat on her back with limited movement. Recommended pt be placed on a air mattress to reduce risk of pressure injury, to which daughter replies she already has one. Pt refuses air mattress because she can't move on it. Pt educated on pressure injuries and need to offweight sacrum and ischial tuberosities, recommended turns with pillow placement. Attempt to place pt residual limb on pillow because surgical site is pushing into bed surface. Pt refuses citing phantom limb pain. Pt has limited mobility due to increased lethargy and very poor understanding of current medical condition and needs for mobilization to decrease further medical decline. Pt will need LTACH with hemodialysis due to fact that unlikely pt will be able to tolerate sitting in recliner for dialysis. PT has reviewed goals and downgraded due to new L AKA.     Recommendations for follow up therapy are one component of a multi-disciplinary discharge planning process, led by the attending physician.  Recommendations may be updated based on patient status, additional  functional criteria and insurance authorization.  Follow Up Recommendations  Long-term institutional care without follow-up therapy     Assistance Recommended at Discharge Frequent or constant Supervision/Assistance  Patient can return home with the following Two people to help with walking and/or transfers;Two people to help with bathing/dressing/bathroom;Assist for transportation   Equipment Recommendations  Other (comment) (to be provided by LTC facility)       Precautions / Restrictions Precautions Precautions: Fall Precaution Comments: H/o R AKA (08/2021), chronic 3L O2 Restrictions Weight Bearing Restrictions: Yes RLE Weight Bearing: Non weight bearing LLE Weight Bearing: Non weight bearing Other Position/Activity Restrictions: abdominal drain, avoided weight through L hand     Mobility  Bed Mobility Overal bed mobility: Needs Assistance Bed Mobility: Supine to Sit     Supine to sit: Modified independent (Device/Increase time)     General bed mobility comments: refuses rolling to remove lift pad balled up underneath her, use bed rails to pull to back off of bed with HoB elevated, unable to balance in sitting requiring increased use of L UE to maintain upright                Balance Overall balance assessment: Needs assistance Sitting-balance support: Single extremity supported Sitting balance-[name] Scale: Poor Sitting balance - Comments: requires increased use of L UE pulling on bedrail to maintain longsitting in bed                                    Cognition Arousal/Alertness: Lethargic, Suspect due to  medications Behavior During Therapy: WFL for tasks assessed/performed Overall Cognitive Status: Within Functional Limits for tasks assessed                                 General Comments: decreased response to questions,  and commands        Exercises General Exercises - Upper Extremity Shoulder Flexion: AROM, Right,  Left, 5 reps, Supine Elbow Flexion: AROM, Right, Left, 5 reps, Supine Elbow Extension: AROM, Right, Left, 5 reps, Supine Other Exercises Other Exercises: 5 reps of cross body punches focusing on core rotation    General Comments General comments (skin integrity, edema, etc.): Pt in bed with slide pad, wrinkled under her refusing rolling to remove pressure points, Discussed moving to air bed due to already present sacral wound, pt refuses. Pt residual limb is pushing into bed surface refuses placement of pillow. to decrease pressure on the surgical site.      Pertinent Vitals/Pain Pain Assessment Pain Assessment: Faces Faces Pain Scale: Hurts little more Pain Location: L residual limb with movement     PT Goals (current goals can now be found in the care plan section) Acute Rehab PT Goals PT Goal Formulation: With patient Time For Goal Achievement: 10/26/21 Potential to Achieve Goals: Fair Progress towards PT goals: Not progressing toward goals - comment    Frequency    Min 2X/week      PT Plan Current plan remains appropriate       AM-PAC PT "6 Clicks" Mobility   Outcome Measure  Help needed turning from your back to your side while in a flat bed without using bedrails?: A Lot Help needed moving from lying on your back to sitting on the side of a flat bed without using bedrails?: A Lot Help needed moving to and from a bed to a chair (including a wheelchair)?: Total Help needed standing up from a chair using your arms (e.g., wheelchair or bedside chair)?: Total Help needed to walk in hospital room?: Total Help needed climbing 3-5 steps with a railing? : Total 6 Click Score: 8    End of Session Equipment Utilized During Treatment: Oxygen Activity Tolerance: Patient limited by lethargy Patient left: in bed;with call bell/phone within reach   PT Visit Diagnosis: Other abnormalities of gait and mobility (R26.89);Muscle weakness (generalized) (M62.81)     Time:  2683-4196 PT Time Calculation (min) (ACUTE ONLY): 27 min  Charges:  $Therapeutic Exercise: 8-22 mins $Therapeutic Activity: 8-22 mins                     Rome Echavarria B. Migdalia Dk PT, DPT Acute Rehabilitation Services Please use secure chat or  Call Office [phone]    [place] 10/11/2021, 3:45 PM

## 2021-10-11 NOTE — Progress Notes (Signed)
PT refusing CPAP. Pt on 4LNC, no resp distress noted. Will continue to monitor

## 2021-10-11 NOTE — TOC Progression Note (Addendum)
Transition of Care [facility]) - Initial/Assessment Note    Patient Details  Name: Courtney Gray MRN: [id number] Date of Birth: Sep 29, 1970  Transition [facility]) CM/SW Contact:    Milinda Antis, LCSWA Phone Number: 10/11/2021, 4:17 PM  Clinical Narrative:                 CSW called Altha Harm, in central intake at [facility] to check on status of LTC SNF placement referral at [phone].  There was no answer.  CSW left a VM and is awaiting a returned call.  89-  CSW received a returned call from [place] in central intake at [facility] requesting that the referral be sent again.  CSW sent referral and was informed that the information would be passed along to Badger Lee, direct admissions at the facility [phone]), to review. TOC will continue to follow.   Expected Discharge Plan: Skilled Nursing Facility Barriers to Discharge: Continued Medical Work up   Patient Goals and CMS Choice Patient states their goals for this hospitalization and ongoing recovery are:: SNF CMS Medicare.gov Compare Post Acute Care list provided to:: Patient Choice offered to / list presented to : Patient, Adult Children (Daughter, Antionette)  Expected Discharge Plan and Services Expected Discharge Plan: Skilled Nursing Facility In-house Referral: Clinical Social Work Discharge Planning Services: CM Consult Post Acute Care Choice: Durable Medical Equipment Living arrangements for the past 2 months: Single Family Home                                      Prior Living Arrangements/Services Living arrangements for the past 2 months: Single Family Home Lives with:: Adult Children Patient language and need for interpreter reviewed:: Yes Do you feel safe going back to the place where you live?: No   SNF  Need for Family Participation in Patient Care: Yes (Comment) Care giver support system in place?: Yes (comment) Current home services: DME (Cane, w/c, shower sest, rollator, home  O2) Criminal Activity/Legal Involvement Pertinent to Current Situation/Hospitalization: No - Comment as needed  Activities of Daily Living Home Assistive Devices/Equipment: Walker (specify type) ADL Screening (condition at time of admission) Patient's cognitive ability adequate to safely complete daily activities?: No Is the patient deaf or have difficulty hearing?: No Does the patient have difficulty seeing, even when wearing glasses/contacts?: No Does the patient have difficulty concentrating, remembering, or making decisions?: No Patient able to express need for assistance with ADLs?: Yes Does the patient have difficulty dressing or bathing?: Yes Independently performs ADLs?: No Communication: Independent Dressing (OT): Dependent Is this a change from baseline?: Pre-admission baseline Grooming: Needs assistance Is this a change from baseline?: Pre-admission baseline Feeding: Independent Bathing: Dependent Is this a change from baseline?: Pre-admission baseline Toileting: Dependent Is this a change from baseline?: Pre-admission baseline In/Out Bed: Dependent Is this a change from baseline?: Pre-admission baseline Walks in Home: Dependent Is this a change from baseline?: Change from baseline, expected to last >3 days Does the patient have difficulty walking or climbing stairs?: Yes Weakness of Legs: Both Weakness of Arms/Hands: Both  Permission Sought/Granted Permission sought to share information with : Case Manager, Family Supports, Chartered certified accountant granted to share information with : Yes, Verbal Permission Granted  Share Information with NAME: Antoinette  Permission granted to share info w AGENCY: SNF  Permission granted to share info w Relationship: daughter  Permission granted to share info w Contact Information:  Lorenso Courier [phone]  Emotional Assessment Appearance:: Appears stated age Attitude/Demeanor/Rapport: Gracious Affect (typically  observed): Calm Orientation: : Oriented to Self, Oriented to Place, Oriented to  Time, Oriented to Situation (WDL) Alcohol / Substance Use: Not Applicable Psych Involvement: No (comment)  Admission diagnosis:  Splenic infarction [D73.5] Hypoglycemia [E16.2] ESRD (end stage renal disease) on dialysis ([place]) [N18.6, Z99.2] Hypotension [I95.9] Sepsis, due to unspecified organism, unspecified whether acute organ dysfunction present ([place]) [A41.9] Left upper quadrant abdominal pain [R10.12] Patient Active Problem List   Diagnosis Date Noted   Hepatic abscess    Pressure injury of skin 09/13/2021   PVD (peripheral vascular disease) (HCC)    Hx of AKA (above knee amputation), right ([place])    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   Pneumonia of left lower lobe due to infectious organism 09/10/2021   Lactic acidosis 09/10/2021   Mixed diabetic hyperlipidemia associated with type 2 diabetes mellitus ([place]) 09/10/2021   Coronary artery disease involving native coronary artery of native heart without angina pectoris 09/10/2021   Elevated troponin level not due myocardial infarction 09/10/2021   Hypotension 09/10/2021   Dry gangrene (HCC)    Pain 08/03/2021   Ischemic foot pain at rest 08/03/2021   Hypoglycemia 07/10/2021   Volume overload 07/09/2021   Acute pulmonary edema (HCC)    Leukocytosis 06/11/2021   Acute gout 06/11/2021   Hyperkalemia 05/21/2021   Hemorrhoids    Hyperlipidemia 05/18/2021   Rectal bleeding 05/17/2021   Morbid obesity with BMI of 60.0-69.9, adult ([place]) 05/17/2021   NSTEMI (non-ST elevated myocardial infarction) ([place]) 05/16/2021   Essential hypertension 09/01/2020   ESRD (end stage renal disease) on dialysis ([place]) 09/01/2020   Chronic respiratory failure with hypoxia ([place]) 09/01/2020   Hypertensive urgency 11/28/2019   Uncontrolled type 2 diabetes mellitus with hypoglycemia, with long-term current use of insulin ([place]) 11/28/2019   Anemia of chronic disease 11/28/2019    PCP:  Monico Blitz, MD Pharmacy:   [place] [place] [address] Phone: [phone] Fax: [phone]  [facility] [address] [place] [address] Phone: [phone] Fax: [phone]     Social Determinants of Health (SDOH) Interventions    Readmission Risk Interventions    09/16/2021    4:36 PM 08/28/2021   11:50 AM 07/12/2021   12:38 PM  Readmission Risk Prevention Plan  Transportation Screening Complete Complete Complete  Medication Review (RN Care Manager)  Complete Complete  PCP or Specialist appointment within 3-5 days of discharge  Complete Complete  HRI or Home Care Consult Complete Complete Complete  SW Recovery Care/Counseling Consult Complete Complete Complete  Palliative Care Screening  Not Applicable Not Applicable  Skilled Nursing Facility Complete Not Applicable Complete

## 2021-10-11 NOTE — Progress Notes (Signed)
[facility]                                                                              Courtney Gray, is a 51 y.o. female, DOB - 07-31-1970, NFA:[id number] Admit date - 09/09/2021    Outpatient Primary MD for the patient is Monico Blitz, MD  LOS - 31  days  Chief Complaint  Patient presents with   Nausea   Constipation       Brief summary   Courtney Gray is a 51 y.o. 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 She was hypoglycemic, febrile with CT revealing hypoenhancement in the spleen consistent with infarct. Started on IV heparin, empiric antibiotics for septic shock of unclear origin. Later taken for left middle finger amputation 6/19, Ultimately was taken for TEE 6/23 which revealed PFO without vegetation and required transfer to ICU for septic shock found to have hepatic and splenic abscesses. ID has guided IV antibiotics, IR placed hepatic abscess drain 6/25.    Assessment & Plan   Septic shock Bacteremia bacteroids thetaiotaomicron-beta-lactamase positive on 6/17: -repeat blood culture on 6/23 no growth -Continue IV cefepime with dialysis and IV metronidazole -ID following - ID recommended line removal ( right side line and left side line), patient currently is declining   Hepatic and splenic abscess -Status post CT-guided drainage of liver abscess by IR on 6/25 , resolution of hepatic abscess on CT 7/3, drain removed  --CT abdomen pelvis repeated on 7/3 show near completion of previously seen hepatic fluid abscess. Large bilobed subcapsular fluid collection 10.7 cm still persisting.  Small volume perihepatic ascites, small to moderate size loculated left pleural effusion -Continue IV cefepime, Flagyl -s/p splenic abscess drain placement on  7/7  -plan per IR/ID  Splenic infarct -No embolic source noted on echo or CT. -TEE on 6/23 showed normal LV function, negative for endocarditis.  Calcified mass in the right atrium-etiology unclear, possibly calcified thrombus. -eliquis was placed on hold due to subcapsular hematoma 5.9x10x8.9, on and off IV heparin due to anemia requiring frequent transfusion    Left third digit ischemic gangrene Severe PAD with prior right AKA New left first and second toe gangrene - amputation of left third digit on 6/19 by hand surgery S/p left aka on 7/5, + wound vac for at least 7 days -Continue pain control, aspirin, statin  Diabetes mellitus type 2, insulin-dependent, uncontrolled with hypoglycemia -Hemoglobin A1c 5.7% -CBGs controlled, continue current regimen Recent Labs    10/08/21 2116 10/09/21 0951 10/09/21 1323 10/09/21 1536 10/10/21 0752 10/10/21 1754  GLUCAP 176* 93 92 90 149* 170*    HTN  Initially was hypotensive, requiring pressors, midodrine -currently bp stable on amlodipine, low-dose Coreg -with brief hypotension  likely sedation related, monitor   Acute on chronic respiratory failure with hypoxia Suspected OHS, OSA -Continue O2, 2 to 3 L via , at baseline -will benefit from outpatient sleep study  ESRD on hemodialysis, TTS -HD per nephrology   Anemia, multifactorial -Likely due to CKD, also had menorrhagia, now resolved -Status post 2 unit packed RBCs 5/23, 6/22 , 7/6 , 7/7 -Continue Aranesp.   -Hemoglobin 7.1, follow H&H in a.m., transfuse if less than 7, Hold heparin if hgb less than 7, hold asa  Menorrhagia -Resolved, Megace discontinued  Demand ischemia with elevated troponin in the setting of anemia, sepsis -ACS ruled out.  Currently no chest pain.    Hyperlipidemia  -Continue statin   GERD  -continue PPI  Acute on chronic ambulatory dysfunction -Continue PT OT, recommended SNF    Pressure ulcer stage II left and right buttock  POA -Continue local wound care.  Change position every 2 hours  Morbid obesity Estimated body mass index is 54.41 kg/m as calculated from the following:   Height as of this encounter: 5\' 5"  (1.651 m).   Weight as of this encounter: 148.3 kg.  OSA? Reports Have not done sleep study , wearing oxygen at home  FTT: Frequent hospitalizations, 6th admissions this year  Code Status: Full code DVT Prophylaxis:  SCD's Start: 10/09/21 1713  Currently on heparin drip  Level of Care: Level of care: Med-Surg Family Communication: Updated patient's daughter at the bedside on 10/11/2021.  Requested SNF when patient is medically ready   Disposition Plan:      Remains inpatient appropriate: On IV antibiotics, work-up in process  Consultants:   Nephrology Infectious disease Interventional radiology General surgery Vascular surgery Hand surgery Critical care Wound care  Antimicrobials:   Anti-infectives (From admission, onward)    Start     Dose/Rate Route Frequency Ordered Stop   09/25/21 0915  metroNIDAZOLE (FLAGYL) IVPB 500 mg        500 mg 100 mL/hr over 60 Minutes Intravenous Every 12 hours 09/25/21 0822     09/13/21 1000  vancomycin (VANCOCIN) IVPB 1000 mg/200 mL premix        1,000 mg 200 mL/hr over 60 Minutes Intravenous STAT 09/13/21 0904 09/13/21 1140   09/10/21 1200  vancomycin (VANCOCIN) IVPB 1000 mg/200 mL premix  Status:  Discontinued        1,000 mg 200 mL/hr over 60 Minutes Intravenous Every T-Th-Sa (Hemodialysis) 09/10/21 0620 09/17/21 1013   09/10/21 1200  ceFEPIme (MAXIPIME) 2 g in sodium chloride 0.9 % 100 mL IVPB        2 g 200 mL/hr over 30 Minutes Intravenous Every T-Th-Sa (Hemodialysis) 09/10/21 1107     09/10/21 0445  vancomycin (VANCOREADY) IVPB 2000 mg/400 mL        2,000 mg 200 mL/hr over 120 Minutes Intravenous  Once 09/10/21 0435 09/10/21 0800   09/10/21 0430  ceFEPIme (MAXIPIME) 2 g in sodium chloride 0.9 % 100 mL IVPB        2 g 200 mL/hr over 30  Minutes Intravenous  Once 09/10/21 0420 09/10/21 0550          Medications  sodium chloride   Intravenous Once   amLODipine  10 mg Oral Daily   aspirin EC  81 mg Oral Daily   atorvastatin  10 mg Oral QHS   calcitRIOL  1 mcg Oral Q T,Th,Sa-HD   carvedilol  3.125 mg Oral BID WC   cinacalcet  30 mg Oral Q T,Th,Sa-HD   darbepoetin (ARANESP) injection - DIALYSIS  200 mcg Intravenous Q Sat-HD   diclofenac Sodium  2 g Topical QID   docusate sodium  100 mg Oral Daily   feeding  supplement  1 Container Oral TID BM   gabapentin  100 mg Oral Q12H   gabapentin  200 mg Oral Q T,Th,Sa-HD   Gerhardt's butt cream   Topical BID   insulin aspart  1-3 Units Subcutaneous TID WC   metoCLOPramide (REGLAN) injection  5 mg Intravenous Q6H   multivitamin  1 tablet Oral QHS   pantoprazole  40 mg Oral BID   polyethylene glycol  17 g Oral Daily   sodium chloride flush  10-40 mL Intracatheter Q12H   sodium chloride flush  3 mL Intravenous Q12H   sodium chloride flush  5 mL Intracatheter Q8H      Subjective:   Courtney Gray was seen and examined.    She is seen after returned from splenic drain placement, daughter is at bedside  Hgb dropped, suspect surgical wound loss with baseline anemia of chronic disease, denies menorrhagia, denies blood in stool, getting prbc transfusion,  She does not want to has a drain placed today  Bp stable  She received enema due to constipation ( no bm for 10 day), not she request Imodium due to frequent stooling     She is on 4liter oxygen , which is her baseline     Objective:   Vitals:   10/10/21 1701 10/10/21 2106 10/11/21 0500 10/11/21 0547  BP: (!) 127/57 (!) 100/51  133/78  Pulse: 88 83  82  Resp: 19 18  16   Temp: 98.2 F (36.8 C) 98.9 F (37.2 C)  97.8 F (36.6 C)  TempSrc:      SpO2: 100% 100%    Weight:   (!) 148.3 kg   Height:        Intake/Output Summary (Last 24 hours) at 10/11/2021 0724 Last data filed at 10/11/2021 0600 Gross per 24  hour  Intake 1380.9 ml  Output 3404.5 ml  Net -2023.6 ml      Wt Readings from Last 3 Encounters:  10/11/21 (!) 148.3 kg  09/03/21 (!) 150.6 kg  08/28/21 (!) 150.1 kg   Physical Exam General: Alert and oriented x 3, NAD Cardiovascular: S1 S2 clear, RRR.  Respiratory: Diminished breath sound at the bases Gastrointestinal: Soft, nontender,  NBS, drain+ Ext: right AKA, left aka Neuro: no new deficits Skin: no rash Psych: Normal affect and demeanor, alert and oriented x3     Data Reviewed:  I have personally reviewed following labs    CBC Lab Results  Component Value Date   WBC 30.3 (H) 10/11/2021   RBC 2.35 (L) 10/11/2021   HGB 6.7 (LL) 10/11/2021   HCT 23.1 (L) 10/11/2021   MCV 98.3 10/11/2021   MCH 28.5 10/11/2021   PLT 242 10/11/2021   MCHC 29.0 (L) 10/11/2021   RDW 22.4 (H) 10/11/2021   LYMPHSABS 1.0 09/28/2021   MONOABS 1.3 (H) 09/28/2021   EOSABS 0.0 09/28/2021   BASOSABS 0.0 76/54/6503     Last metabolic panel Lab Results  Component Value Date   NA 134 (L) 10/11/2021   K 4.0 10/11/2021   CL 96 (L) 10/11/2021   CO2 28 10/11/2021   BUN 20 10/11/2021   CREATININE 4.33 (H) 10/11/2021   GLUCOSE 160 (H) 10/11/2021   GFRNONAA 12 (L) 10/11/2021   GFRAA 6 (L) 11/29/2019   CALCIUM 7.8 (L) 10/11/2021   PHOS 4.6 10/11/2021   PROT 6.0 (L) 09/14/2021   ALBUMIN 1.9 (L) 10/11/2021   BILITOT 0.7 09/14/2021   ALKPHOS 124 09/14/2021   AST 16 09/14/2021   ALT 10  09/14/2021   ANIONGAP 10 10/11/2021    CBG (last 3)  Recent Labs    10/09/21 1536 10/10/21 0752 10/10/21 1754  GLUCAP 90 149* 170*       Florencia Reasons M.D. PhD FACP [facility] 10/11/2021, 7:24 AM  Available via Epic secure chat 7am-7pm After 7 pm, please refer to night coverage provider listed on amion.

## 2021-10-11 NOTE — Progress Notes (Signed)
[facility] Progress Note   Subjective:  Seen in room. Had dialysis yesterday with 3.5L removed. No issues with dialysis. For IR drainage of splenic abscess today. She's tired after so many procedures. Hgb 6.7 this am - for transfusion   Objective Vitals:   10/10/21 1701 10/10/21 2106 10/11/21 0500 10/11/21 0547  BP: (!) 127/57 (!) 100/51  133/78  Pulse: 88 83  82  Resp: 19 18  16   Temp: 98.2 F (36.8 C) 98.9 F (37.2 C)  97.8 F (36.6 C)  TempSrc:      SpO2: 100% 100%    Weight:   (!) 148.3 kg   Height:       Physical Exam General: Obese woman, NAD. Nasal O2 in place. Heart: RRR; no murmur Lungs: CTA anteriorly Abdomen: soft Extremities: R AKA,  L AKA with wound vac in place,  L hand bandaged Dialysis Access: [facility]  Additional Objective Labs: Basic Metabolic Panel: Recent Labs  Lab 10/09/21 0050 10/09/21 1342 10/10/21 0330 10/11/21 0503  NA 132* 131* 130* 134*  K 3.6 3.7 4.9 4.0  CL 94* 95* 95* 96*  CO2 25  --  23 28  GLUCOSE 127* 88 193* 160*  BUN 18 20 25* 20  CREATININE 4.44* 5.50* 5.89* 4.33*  CALCIUM 8.3*  --  7.9* 7.8*  PHOS 3.7  --  6.7* 4.6    Liver Function Tests: Recent Labs  Lab 10/09/21 0050 10/10/21 0330 10/11/21 0503  ALBUMIN 1.9* 2.0* 1.9*    CBC: Recent Labs  Lab 10/07/21 0520 10/08/21 0506 10/09/21 0050 10/09/21 1342 10/10/21 0330 10/10/21 1615 10/11/21 0503  WBC 19.7* 20.7* 21.3*  --  31.0*  --  30.3*  HGB 7.2* 7.1* 7.3*   < > 6.6* 7.1* 6.7*  HCT 24.6* 23.9* 25.6*   < > 22.9* 24.0* 23.1*  MCV 98.4 96.8 99.6  --  98.3  --  98.3  PLT 298 306 307  --  310  --  242   < > = values in this interval not displayed.    Blood Culture    Component Value Date/Time   SDES ABSCESS 09/29/2021 1106   SPECREQUEST HEPATIC ABSCESS 09/29/2021 1106   CULT  09/29/2021 1106    No growth aerobically or anaerobically. Performed at [facility], [place] [address], [place], [place] [address]    REPTSTATUS 10/04/2021  FINAL 09/29/2021 1106   Studies/Results: No results found.  Medications:  sodium chloride 10 mL/hr at 10/09/21 1410   ceFEPime (MAXIPIME) IV 2 g (10/10/21 1530)   magnesium sulfate bolus IVPB     metronidazole 500 mg (10/11/21 0520)    sodium chloride   Intravenous Once   amLODipine  10 mg Oral Daily   aspirin EC  81 mg Oral Daily   atorvastatin  10 mg Oral QHS   calcitRIOL  1 mcg Oral Q T,Th,Sa-HD   carvedilol  3.125 mg Oral BID WC   cinacalcet  30 mg Oral Q T,Th,Sa-HD   darbepoetin (ARANESP) injection - DIALYSIS  200 mcg Intravenous Q Sat-HD   diclofenac Sodium  2 g Topical QID   docusate sodium  100 mg Oral Daily   feeding supplement  1 Container Oral TID BM   gabapentin  100 mg Oral Q12H   gabapentin  200 mg Oral Q T,Th,Sa-HD   Gerhardt's butt cream   Topical BID   insulin aspart  1-3 Units Subcutaneous TID WC   metoCLOPramide (REGLAN) injection  5 mg Intravenous  Q6H   multivitamin  1 tablet Oral QHS   pantoprazole  40 mg Oral BID   polyethylene glycol  17 g Oral Daily   sodium chloride flush  10-40 mL Intracatheter Q12H   sodium chloride flush  3 mL Intravenous Q12H   sodium chloride flush  5 mL Intracatheter Q8H    Dialysis Orders: [name] TTS 4h 73min, BFR 400, EDW 151 kg, 1K/2.5Ca bath, TDC Hep 2,600 load and 1,500 hourly-Total dose 9275 units per treatment.  - mircera 200ug q2 due 09/10/21 - calcitriol 1 ug tiw - sensipar 30 mg tiw   Assessment/Plan: Septic shock: On admit requiring short-term pressors and again after TEE. Found to have bacteremia with bacteroides thetaiotaomicron and liver abscess and concern for splenic abscess. Appreciate ID - for now they do not feel that we need to remove the tunneled catheter. Remains on course of Cefepime with HD and oral metronidazole.  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 Splenic/Hepatic abscesses: S/p drain placed in  IR 09/29/2021.  ESRD: Continue HD per usual TTS schedule - next tomorrow. Pt has requested 4 hour treatments instead of 4 hr 42min. Advised we can try 4 hour treatments as long as labs are acceptable and we are removing adequate amounts of fluid. No heparin with HD, on heparin drip for now. Chronic hypoxic resp failure - At baseline, she is on 3L O2 at home. HTN/volume:  Optimize UF with HD. She has now reached her old EDW but need to lower further. CXR repeated still shows vascular congestion.  Anemia of ESRD: Hgb 6.7 post op. For transfusion today 7/7.  Marland Kitchen Continue Aranesp 200 mcg q Saturday. Transfused 1 unit of PRBCs with HD 09/26/2021.     Secondary Hyperparathyroidism: CorrCa/Phos ok, requesting to hold binder due to constipation but resume if phos trends back up. Continue VDRA + sensipar.   T2DM- per primary team Malnutrition in setting of Morbid Obesity - Albumin very low. Continue protein supplements.   [name] PA-C [facility] 10/11/2021,9:08 AM

## 2021-10-12 DIAGNOSIS — D735 Infarction of spleen: Secondary | ICD-10-CM | POA: Diagnosis not present

## 2021-10-12 LAB — HEPARIN LEVEL (UNFRACTIONATED): Heparin Unfractionated: 0.35 IU/mL (ref 0.30–0.70)

## 2021-10-12 LAB — CBC
HCT: 25.6 % — ABNORMAL LOW (ref 36.0–46.0)
Hemoglobin: 7.4 g/dL — ABNORMAL LOW (ref 12.0–15.0)
MCH: 28.2 pg (ref 26.0–34.0)
MCHC: 28.9 g/dL — ABNORMAL LOW (ref 30.0–36.0)
MCV: 97.7 fL (ref 80.0–100.0)
Platelets: 217 10*3/uL (ref 150–400)
RBC: 2.62 MIL/uL — ABNORMAL LOW (ref 3.87–5.11)
RDW: 21.3 % — ABNORMAL HIGH (ref 11.5–15.5)
WBC: 20.7 10*3/uL — ABNORMAL HIGH (ref 4.0–10.5)
nRBC: 0.2 % (ref 0.0–0.2)

## 2021-10-12 LAB — TYPE AND SCREEN
ABO/RH(D): O POS
Antibody Screen: NEGATIVE
Unit division: 0
Unit division: 0

## 2021-10-12 LAB — RENAL FUNCTION PANEL
Albumin: 2 g/dL — ABNORMAL LOW (ref 3.5–5.0)
Anion gap: 13 (ref 5–15)
BUN: 28 mg/dL — ABNORMAL HIGH (ref 6–20)
CO2: 24 mmol/L (ref 22–32)
Calcium: 7.3 mg/dL — ABNORMAL LOW (ref 8.9–10.3)
Chloride: 96 mmol/L — ABNORMAL LOW (ref 98–111)
Creatinine, Ser: 5.25 mg/dL — ABNORMAL HIGH (ref 0.44–1.00)
GFR, Estimated: 9 mL/min — ABNORMAL LOW (ref >60)
Glucose, Bld: 120 mg/dL — ABNORMAL HIGH (ref 70–99)
Phosphorus: 5.1 mg/dL — ABNORMAL HIGH (ref 2.5–4.6)
Potassium: 4 mmol/L (ref 3.5–5.1)
Sodium: 133 mmol/L — ABNORMAL LOW (ref 135–145)

## 2021-10-12 LAB — BPAM RBC
Blood Product Expiration Date: 202307292359
Blood Product Expiration Date: 202308032359
ISSUE DATE / TIME: 202307060909
ISSUE DATE / TIME: 202307071310
Unit Type and Rh: 5100
Unit Type and Rh: 5100

## 2021-10-12 LAB — GLUCOSE, CAPILLARY
Glucose-Capillary: 100 mg/dL — ABNORMAL HIGH (ref 70–99)
Glucose-Capillary: 87 mg/dL (ref 70–99)
Glucose-Capillary: 197 mg/dL — ABNORMAL HIGH (ref 70–99)
Glucose-Capillary: 125 mg/dL — ABNORMAL HIGH (ref 70–99)

## 2021-10-12 LAB — HEMOGLOBIN AND HEMATOCRIT, BLOOD
HCT: 25.8 % — ABNORMAL LOW (ref 36.0–46.0)
Hemoglobin: 7.8 g/dL — ABNORMAL LOW (ref 12.0–15.0)

## 2021-10-12 MED ORDER — POLYETHYLENE GLYCOL 3350 17 G PO PACK
17.0000 g | PACK | Freq: Two times a day (BID) | ORAL | Status: DC
  Administered 2021-10-13 – 2021-10-19 (×8): 17 g via ORAL
  Filled 2021-10-12 (×15): qty 1

## 2021-10-12 MED ORDER — HEPARIN (PORCINE) 25000 UT/250ML-% IV SOLN
1950.0000 [IU]/h | INTRAVENOUS | Status: DC
  Administered 2021-10-12 – 2021-10-13 (×3): 1950 [IU]/h via INTRAVENOUS
  Filled 2021-10-12 (×4): qty 250

## 2021-10-12 MED ORDER — HEPARIN SODIUM (PORCINE) 1000 UNIT/ML DIALYSIS
1000.0000 [IU] | INTRAMUSCULAR | Status: DC | PRN
  Administered 2021-10-12: 1000 [IU] via INTRAVENOUS_CENTRAL
  Filled 2021-10-12: qty 1

## 2021-10-12 MED ORDER — ALTEPLASE 2 MG IJ SOLR: 2.0000 mg | Freq: Once | INTRAMUSCULAR | Status: DC | PRN

## 2021-10-12 NOTE — Procedures (Signed)
I was present at this dialysis session. I have reviewed the session itself and made appropriate changes.   Vital signs in last 24 hours:  Temp:  [97.6 F (36.4 C)-98.5 F (36.9 C)] 97.7 F (36.5 C) (07/08 0727) Pulse Rate:  [48-92] 48 (07/08 0727) Resp:  [0-20] 0 (07/08 0727) BP: (97-152)/(51-98) 142/63 (07/08 0727) SpO2:  [97 %-100 %] 100 % (07/08 0727) Weight:  [152 kg] 152 kg (07/08 0727) Weight change: 1.6 kg Filed Weights   10/11/21 0500 10/12/21 0500 10/12/21 0727  Weight: (!) 148.3 kg (!) 152 kg (!) 152 kg    Recent Labs  Lab 10/12/21 0315  NA 133*  K 4.0  CL 96*  CO2 24  GLUCOSE 120*  BUN 28*  CREATININE 5.25*  CALCIUM 7.3*  PHOS 5.1*    Recent Labs  Lab 10/10/21 0330 10/10/21 1615 10/11/21 0503 10/11/21 2031 10/12/21 0315  WBC 31.0*  --  30.3*  --  20.7*  HGB 6.6*   < > 6.7* 7.3* 7.4*  HCT 22.9*   < > 23.1* 24.7* 25.6*  MCV 98.3  --  98.3  --  97.7  PLT 310  --  242  --  217   < > = values in this interval not displayed.    Scheduled Meds:  amLODipine  10 mg Oral Daily   aspirin EC  81 mg Oral Daily   atorvastatin  10 mg Oral QHS   calcitRIOL  1 mcg Oral Q T,Th,Sa-HD   carvedilol  3.125 mg Oral BID WC   cinacalcet  30 mg Oral Q T,Th,Sa-HD   darbepoetin (ARANESP) injection - DIALYSIS  200 mcg Intravenous Q Sat-HD   diclofenac Sodium  2 g Topical QID   docusate sodium  100 mg Oral Daily   feeding supplement  1 Container Oral TID BM   gabapentin  100 mg Oral Q12H   gabapentin  200 mg Oral Q T,Th,Sa-HD   Gerhardt's butt cream   Topical BID   insulin aspart  1-3 Units Subcutaneous TID WC   metoCLOPramide (REGLAN) injection  5 mg Intravenous Q6H   multivitamin  1 tablet Oral QHS   pantoprazole  40 mg Oral BID   polyethylene glycol  17 g Oral Daily   sodium chloride flush  10-40 mL Intracatheter Q12H   sodium chloride flush  3 mL Intravenous Q12H   sodium chloride flush  5 mL Intracatheter Q8H   Continuous Infusions:  sodium chloride 10 mL/hr  at 10/09/21 1410   ceFEPime (MAXIPIME) IV 2 g (10/10/21 1530)   heparin     magnesium sulfate bolus IVPB     metronidazole 500 mg (10/12/21 0525)   PRN Meds:.acetaminophen **OR** acetaminophen, albuterol, [START ON 10/13/2021] alteplase, alum & mag hydroxide-simeth, bisacodyl, diphenhydrAMINE, fentaNYL, glucagon (human recombinant), glucose, guaiFENesin-dextromethorphan, [START ON 10/13/2021] heparin, hydrALAZINE, HYDROmorphone (DILAUDID) injection, HYDROmorphone, labetalol, lactulose, lidocaine (PF), lidocaine-prilocaine, loperamide, magnesium sulfate bolus IVPB, midazolam, naLOXone (NARCAN)  injection, nitroGLYCERIN, ondansetron **OR** ondansetron (ZOFRAN) IV, oxyCODONE, oxyCODONE, pentafluoroprop-tetrafluoroeth, phenol, polyethylene glycol, potassium chloride, prochlorperazine, senna-docusate   Donetta Potts,  MD 10/12/2021, 8:24 AM

## 2021-10-12 NOTE — Progress Notes (Signed)
ANTICOAGULATION CONSULT NOTE  Pharmacy Consult for heparin Indication:  splenic infarct  Height: 5 ft 5 inches Weight : 150.4 kg Heparin Dosing Weight: 95 kg  Labs: Recent Labs    10/09/21 1219 10/09/21 1342 10/10/21 0330 10/10/21 0936 10/10/21 1615 10/11/21 0503 10/11/21 2031 10/12/21 0315  HGB  --    < > 6.6*  --    < > 6.7* 7.3* 7.4*  HCT  --    < > 22.9*  --    < > 23.1* 24.7* 25.6*  PLT  --   --  310  --   --  242  --  217  LABPROT 16.5*  --   --   --   --   --   --   --   INR 1.3*  --   --   --   --   --   --   --   HEPARINUNFRC  --   --   --  <0.10*  --  0.34  --   --   CREATININE  --    < > 5.89*  --   --  4.33*  --  5.25*   < > = values in this interval not displayed.     Assessment: 23 yof with hx PVD s/p R AKA 5/10 for critical limb ischemia, ESRD on HD TTS, found to have splenic infarct and transitioned from heparin infusion to apixaban on 6/14. Pharmacy consulted to transition back to heparin infusion with patient hypotensive requiring transfer to ICU s/p TEE on 6/23. Last dose of apixaban 6/23 at 1048. She is s/p IR drain placement for hepatic abscess and 6/23 noted CT finding of possible subcapsular hematoma so heparin goal was lowered.  Pharmacy received consult to restart heparin. Hgb 7.4 and stable. Will restart at previous rate, no bolus.    Goal of Therapy:  Heparin level 0.3-0.5 units/ml Monitor platelets by anticoagulation protocol: Yes   PLAN:    Resume heparin drip 1950 units/hr (reduced rate from previous dosing due to low Hgb s/p Left AKA. F/u 8 hr HL Daily HL and CBC..   Ameenah Prosser A. Levada Dy, PharmD, BCPS, FNKF Clinical Pharmacist [place] Please utilize Amion for appropriate phone number to reach the unit pharmacist ([place])

## 2021-10-12 NOTE — Progress Notes (Signed)
[facility]                                                                              [facility], is a 51 y.o. female, DOB - 04/24/1970, FOY:[id number] Admit date - 09/09/2021    Outpatient Primary MD for the patient is Monico Blitz, MD  LOS - 32  days  Chief Complaint  Patient presents with   Nausea   Constipation       Brief summary   Courtney Gray is a 51 y.o. 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 She was hypoglycemic, febrile with CT revealing hypoenhancement in the spleen consistent with infarct. Started on IV heparin, empiric antibiotics for septic shock of unclear origin. Later taken for left middle finger amputation 6/19, Ultimately was taken for TEE 6/23 which revealed PFO without vegetation and required transfer to ICU for septic shock found to have hepatic and splenic abscesses. ID has guided IV antibiotics, IR placed hepatic abscess drain 6/25.    Assessment & Plan   Septic shock/Bacteremia bacteroids thetaiotaomicron-beta-lactamase positive : -repeat blood culture on 6/23 no growth -Continue IV cefepime with dialysis and IV metronidazole -ID following - ID recommended line removal ( right side line and left side line) due to persistent leukocytosis, patient currently is declining   Hepatic and splenic abscess -Status post CT-guided drainage of liver abscess by IR on 6/25 , resolution of hepatic abscess on CT 7/3, drain removed  --CT abdomen pelvis repeated on 7/3 show near completion of previously seen hepatic fluid abscess. Large bilobed subcapsular fluid collection 10.7 cm still persisting.  Small volume perihepatic ascites, small to moderate size loculated left pleural effusion -s/p splenic abscess drain placement on 7/7 ,  drain being flushed q shift, drain management per IR -Continue IV cefepime, Flagyl -plan per IR/ID  Splenic infarct --TEE on 6/23 showed normal LV function, negative for endocarditis.  Calcified mass in the right atrium-etiology unclear, possibly calcified thrombus. -eliquis was placed on hold due to subcapsular hematoma 5.9x10x8.9, on and off IV heparin due to anemia requiring frequent transfusion  -resume heparin drip on 7/8 now that hgb appears relatively stable   Left third digit ischemic gangrene ,amputation of left third digit on 6/19 by hand surgery, will need to contact hand surgery on Monday for follow up New left first and second toe gangrene,S/p left aka on 7/5, + wound vac for 10 days Severe PAD with prior right AKA --Continue pain control, aspirin, statin  Diabetes mellitus type 2, insulin-dependent, uncontrolled with hypoglycemia -Hemoglobin A1c 5.7% -on long acting insulin at home, held due to hypoglycemia -CBGs controlled, continue current meal coverage ,  HTN  Initially was hypotensive, requiring pressors, midodrine -currently bp stable on amlodipine, low-dose Coreg -with brief hypotension  likely sedation related, monitor   Acute on chronic respiratory failure with hypoxia Suspected OHS, OSA -Continue O2, 2 to 3 L via [place], at baseline -will benefit  from outpatient sleep study   ESRD on hemodialysis, TTS -HD per nephrology   Anemia, multifactorial -Likely due to CKD, also had menorrhagia, now resolved -Status post 2 unit packed RBCs 5/23, 6/22 , 7/6 , 7/7 -Continue Aranesp.   -Hemoglobin 7.1, follow H&H in a.m., transfuse if less than 7, Hold heparin if hgb less than 7, hold asa  Menorrhagia -Resolved, Megace discontinued  Demand ischemia with elevated troponin in the setting of anemia, sepsis -ACS ruled out.  Currently no chest pain.    Hyperlipidemia  -Continue statin   GERD  -continue PPI  Acute on chronic ambulatory dysfunction -Continue PT  OT, recommended SNF    Pressure ulcer stage II left and right buttock POA -Continue local wound care.  Change position every 2 hours  Morbid obesity Estimated body mass index is 55.76 kg/m as calculated from the following:   Height as of this encounter: 5\' 5"  (1.651 m).   Weight as of this encounter: 152 kg.  OSA? Reports Have not done sleep study , wearing oxygen at home  FTT: Frequent hospitalizations, 6th admissions this year  Code Status: Full code DVT Prophylaxis:  SCD's Start: 10/09/21 1713  Currently on heparin drip  Level of Care: Level of care: Med-Surg Family Communication: Updated patient's daughter at the bedside on 10/11/2021.  Requested SNF when patient is medically ready   Disposition Plan:      Remains inpatient appropriate: On IV antibiotics, work-up in process  Consultants:   Nephrology Infectious disease Interventional radiology General surgery Vascular surgery Hand surgery Critical care Wound care  Antimicrobials:   Anti-infectives (From admission, onward)    Start     Dose/Rate Route Frequency Ordered Stop   09/25/21 0915  metroNIDAZOLE (FLAGYL) IVPB 500 mg        500 mg 100 mL/hr over 60 Minutes Intravenous Every 12 hours 09/25/21 0822     09/13/21 1000  vancomycin (VANCOCIN) IVPB 1000 mg/200 mL premix        1,000 mg 200 mL/hr over 60 Minutes Intravenous STAT 09/13/21 0904 09/13/21 1140   09/10/21 1200  vancomycin (VANCOCIN) IVPB 1000 mg/200 mL premix  Status:  Discontinued        1,000 mg 200 mL/hr over 60 Minutes Intravenous Every T-Th-Sa (Hemodialysis) 09/10/21 0620 09/17/21 1013   09/10/21 1200  ceFEPIme (MAXIPIME) 2 g in sodium chloride 0.9 % 100 mL IVPB        2 g 200 mL/hr over 30 Minutes Intravenous Every T-Th-Sa (Hemodialysis) 09/10/21 1107     09/10/21 0445  vancomycin (VANCOREADY) IVPB 2000 mg/400 mL        2,000 mg 200 mL/hr over 120 Minutes Intravenous  Once 09/10/21 0435 09/10/21 0800   09/10/21 0430  ceFEPIme (MAXIPIME) 2 g in  sodium chloride 0.9 % 100 mL IVPB        2 g 200 mL/hr over 30 Minutes Intravenous  Once 09/10/21 0420 09/10/21 0550          Medications  amLODipine  10 mg Oral Daily   aspirin EC  81 mg Oral Daily   atorvastatin  10 mg Oral QHS   calcitRIOL  1 mcg Oral Q T,Th,Sa-HD   carvedilol  3.125 mg Oral BID WC   cinacalcet  30 mg Oral Q T,Th,Sa-HD   darbepoetin (ARANESP) injection - DIALYSIS  200 mcg Intravenous Q Sat-HD   diclofenac Sodium  2 g Topical QID   docusate sodium  100 mg Oral Daily   feeding supplement  1 Container Oral TID BM   gabapentin  100 mg Oral Q12H   gabapentin  200 mg Oral Q T,Th,Sa-HD   Gerhardt's butt cream   Topical BID   insulin aspart  1-3 Units Subcutaneous TID WC   metoCLOPramide (REGLAN) injection  5 mg Intravenous Q6H   multivitamin  1 tablet Oral QHS   pantoprazole  40 mg Oral BID   polyethylene glycol  17 g Oral Daily   sodium chloride flush  10-40 mL Intracatheter Q12H   sodium chloride flush  3 mL Intravenous Q12H   sodium chloride flush  5 mL Intracatheter Q8H      Subjective:   Courtney Gray was seen and examined.    She is sitting up in bed,  Does not think pain is well controlled, not remember when the dressing was changed, not remember when the drain was emptied   Bp stable  She received enema due to constipation ( no bm for 10 day), not she request Imodium due to frequent stooling     She is on 4liter oxygen , which is her baseline     Objective:   Vitals:   10/11/21 2056 10/12/21 0302 10/12/21 0500 10/12/21 0727  BP: (!) 106/59 (!) 132/95  (!) 142/63  Pulse: 73 75  (!) 48  Resp: 18 18  (!) 0  Temp: 98.5 F (36.9 C) 98.1 F (36.7 C)  97.7 F (36.5 C)  TempSrc: Oral Oral  Oral  SpO2: 98% 100%  100%  Weight:   (!) 152 kg   Height:        Intake/Output Summary (Last 24 hours) at 10/12/2021 0758 Last data filed at 10/12/2021 0307 Gross per 24 hour  Intake 1012.33 ml  Output 100 ml  Net 912.33 ml      Wt  Readings from Last 3 Encounters:  10/12/21 (!) 152 kg  09/03/21 (!) 150.6 kg  08/28/21 (!) 150.1 kg   Physical Exam General: Alert and oriented x 3, NAD Cardiovascular: S1 S2 clear, RRR.  Respiratory: Diminished breath sound at the bases Gastrointestinal: Soft, nontender,  NBS, drain+ Ext: right AKA, left aka Neuro: no new deficits Skin: no rash Psych: Normal affect and demeanor, alert and oriented x3     Data Reviewed:  I have personally reviewed following labs    CBC Lab Results  Component Value Date   WBC 20.7 (H) 10/12/2021   RBC 2.62 (L) 10/12/2021   HGB 7.4 (L) 10/12/2021   HCT 25.6 (L) 10/12/2021   MCV 97.7 10/12/2021   MCH 28.2 10/12/2021   PLT 217 10/12/2021   MCHC 28.9 (L) 10/12/2021   RDW 21.3 (H) 10/12/2021   LYMPHSABS 1.0 09/28/2021   MONOABS 1.3 (H) 09/28/2021   EOSABS 0.0 09/28/2021   BASOSABS 0.0 26/83/4196     Last metabolic panel Lab Results  Component Value Date   NA 133 (L) 10/12/2021   K 4.0 10/12/2021   CL 96 (L) 10/12/2021   CO2 24 10/12/2021   BUN 28 (H) 10/12/2021   CREATININE 5.25 (H) 10/12/2021   GLUCOSE 120 (H) 10/12/2021   GFRNONAA 9 (L) 10/12/2021   GFRAA 6 (L) 11/29/2019   CALCIUM 7.3 (L) 10/12/2021   PHOS 5.1 (H) 10/12/2021   PROT 6.0 (L) 09/14/2021   ALBUMIN 2.0 (L) 10/12/2021   BILITOT 0.7 09/14/2021   ALKPHOS 124 09/14/2021   AST 16 09/14/2021   ALT 10 09/14/2021   ANIONGAP 13 10/12/2021    CBG (last 3)  Recent  Labs    10/11/21 1143 10/11/21 1658 10/12/21 0708  GLUCAP 110* 248* 100*       Florencia Reasons M.D. PhD FACP [facility] 10/12/2021, 7:58 AM  Available via Epic secure chat 7am-7pm After 7 pm, please refer to night coverage provider listed on amion.

## 2021-10-12 NOTE — Progress Notes (Signed)
ANTICOAGULATION CONSULT NOTE  Pharmacy Consult for heparin Indication:  splenic infarct  Height: 5 ft 5 inches Weight : 150.4 kg Heparin Dosing Weight: 95 kg  Labs: Recent Labs    10/10/21 0330 10/10/21 0936 10/10/21 1615 10/11/21 0503 10/11/21 2031 10/12/21 0315 10/12/21 1700  HGB 6.6*  --    < > 6.7* 7.3* 7.4* 7.8*  HCT 22.9*  --    < > 23.1* 24.7* 25.6* 25.8*  PLT 310  --   --  242  --  217  --   HEPARINUNFRC  --  <0.10*  --  0.34  --   --  0.35  CREATININE 5.89*  --   --  4.33*  --  5.25*  --    < > = values in this interval not displayed.     Assessment: 44 yof with hx PVD s/p R AKA 5/10 for critical limb ischemia, ESRD on HD TTS, found to have splenic infarct and transitioned from heparin infusion to apixaban on 6/14. Pharmacy consulted to transition back to heparin infusion with patient hypotensive requiring transfer to ICU s/p TEE on 6/23. Last dose of apixaban 6/23 at 1048. She is s/p IR drain placement for hepatic abscess and 6/23 noted CT finding of possible subcapsular hematoma so heparin goal was lowered.  Pharmacy received consult to restart heparin. Hgb 7.4 and stable. Will restart at previous rate, no bolus.   Heparin level therapeutic    Goal of Therapy:  Heparin level 0.3-0.5 units/ml Monitor platelets by anticoagulation protocol: Yes   PLAN:    Continue heparin at 1950 units / hr Follow up AM labs  Thank you Anette Guarneri, PharmD Please utilize Amion for appropriate phone number to reach the unit pharmacist ([place])

## 2021-10-13 DIAGNOSIS — D735 Infarction of spleen: Secondary | ICD-10-CM | POA: Diagnosis not present

## 2021-10-13 LAB — GLUCOSE, CAPILLARY
Glucose-Capillary: 96 mg/dL (ref 70–99)
Glucose-Capillary: 134 mg/dL — ABNORMAL HIGH (ref 70–99)
Glucose-Capillary: 147 mg/dL — ABNORMAL HIGH (ref 70–99)

## 2021-10-13 LAB — HEMOGLOBIN AND HEMATOCRIT, BLOOD
HCT: 25.1 % — ABNORMAL LOW (ref 36.0–46.0)
Hemoglobin: 7.5 g/dL — ABNORMAL LOW (ref 12.0–15.0)

## 2021-10-13 LAB — HEPARIN LEVEL (UNFRACTIONATED): Heparin Unfractionated: 0.37 IU/mL (ref 0.30–0.70)

## 2021-10-13 NOTE — Progress Notes (Signed)
ANTICOAGULATION CONSULT NOTE  Pharmacy Consult for heparin Indication:  splenic infarct  Height: 5 ft 5 inches Weight : 150.4 kg Heparin Dosing Weight: 95 kg  Labs: Recent Labs    10/11/21 0503 10/11/21 2031 10/12/21 0315 10/12/21 1700 10/13/21 0504  HGB 6.7*   < > 7.4* 7.8* 7.5*  HCT 23.1*   < > 25.6* 25.8* 25.1*  PLT 242  --  217  --   --   HEPARINUNFRC 0.34  --   --  0.35 0.37  CREATININE 4.33*  --  5.25*  --   --    < > = values in this interval not displayed.     Assessment: 61 yof with hx PVD s/p R AKA 5/10 for critical limb ischemia, ESRD on HD TTS, found to have splenic infarct and transitioned from heparin infusion to apixaban on 6/14. Pharmacy consulted to transition back to heparin infusion with patient hypotensive requiring transfer to ICU s/p TEE on 6/23. Last dose of apixaban 6/23 at 1048. She is s/p IR drain placement for hepatic abscess and 6/23 noted CT finding of possible subcapsular hematoma so heparin goal was lowered.  Pharmacy received consult to restart heparin. Hgb 7.5 and stable. Restarted at previous rate on 7/8, no bolus. Heparin level therapeutic at 0.37.    Goal of Therapy:  Heparin level 0.3-0.5 units/ml Monitor platelets by anticoagulation protocol: Yes   PLAN:    Continue heparin drip 1950 units/hr (reduced rate from previous dosing due to low Hgb s/p Left AKA. Daily HL and CBC.  Francena Hanly, Pharm.D. [place] PGY1 Pharmacy Resident   10/13/2021 7:13 AM   Dwayne A. Levada Dy, PharmD, BCPS, FNKF Clinical Pharmacist [place] Please utilize Amion for appropriate phone number to reach the unit pharmacist ([place])

## 2021-10-13 NOTE — Progress Notes (Signed)
[name]   Subjective:  Completed dialysis yesterday. No new complaints this am.   Objective Vitals:   10/12/21 2024 10/13/21 0349 10/13/21 0919 10/13/21 0937  BP: (!) 119/56 122/60 131/62 136/62  Pulse: 78 82 83 92  Resp: 15 20    Temp: 98.5 F (36.9 C) 98.8 F (37.1 C)  99.1 F (37.3 C)  TempSrc: Oral   Oral  SpO2: 98% 99%  94%  Weight:      Height:       Physical Exam General: Obese woman, NAD. Nasal O2 in place. Heart: RRR; no murmur Lungs: CTA anteriorly Abdomen: soft obese  Extremities: R AKA,  L AKA with wound vac in place,  L hand bandaged Dialysis Access: R [facility]  Additional Objective Labs: Basic Metabolic Panel: Recent Labs  Lab 10/10/21 0330 10/11/21 0503 10/12/21 0315  NA 130* 134* 133*  K 4.9 4.0 4.0  CL 95* 96* 96*  CO2 23 28 24   GLUCOSE 193* 160* 120*  BUN 25* 20 28*  CREATININE 5.89* 4.33* 5.25*  CALCIUM 7.9* 7.8* 7.3*  PHOS 6.7* 4.6 5.1*    Liver Function Tests: Recent Labs  Lab 10/10/21 0330 10/11/21 0503 10/12/21 0315  ALBUMIN 2.0* 1.9* 2.0*    CBC: Recent Labs  Lab 10/08/21 0506 10/09/21 0050 10/09/21 1342 10/10/21 0330 10/10/21 1615 10/11/21 0503 10/11/21 2031 10/12/21 0315 10/12/21 1700 10/13/21 0504  WBC 20.7* 21.3*  --  31.0*  --  30.3*  --  20.7*  --   --   HGB 7.1* 7.3*   < > 6.6*   < > 6.7*   < > 7.4* 7.8* 7.5*  HCT 23.9* 25.6*   < > 22.9*   < > 23.1*   < > 25.6* 25.8* 25.1*  MCV 96.8 99.6  --  98.3  --  98.3  --  97.7  --   --   PLT 306 307  --  310  --  242  --  217  --   --    < > = values in this interval not displayed.    Blood Culture    Component Value Date/Time   SDES ABSCESS 10/11/2021 1700   SPECREQUEST SPLEEN 10/11/2021 1700   CULT  10/11/2021 1700    NO GROWTH < 24 HOURS Performed at [facility], [place] [address], [place], [place] [address]    REPTSTATUS PENDING 10/11/2021 1700   Studies/Results: CT IMAGE GUIDED DRAINAGE BY PERCUTANEOUS  CATHETER  Result Date: 10/12/2021 INDICATION: History of multifocal infection, post percutaneous hepatic abscess drainage catheter placement on 09/29/2021. Patient presents today for image guided placement of a perisplenic abscess drainage catheter for infection source control purposes. EXAM: ULTRASOUND AND CT-GUIDED PERISPLENIC ABSCESS DRAINAGE CATHETER PLACEMENT COMPARISON:  CT abdomen and pelvis-10/07/2021 MEDICATIONS: The patient is currently admitted to the hospital and receiving intravenous antibiotics. The antibiotics were administered within an appropriate time frame prior to the initiation of the procedure. ANESTHESIA/SEDATION: Moderate (conscious) sedation was employed during this procedure as administered by the Interventional Radiology RN. A total of Versed 3 mg and Fentanyl 150 mcg was administered intravenously. Moderate Sedation Time: 25 minutes. The patient's level of consciousness and vital signs were monitored continuously by radiology nursing throughout the procedure under my direct supervision. CONTRAST:  None COMPLICATIONS: None immediate. PROCEDURE: RADIATION DOSE REDUCTION: This exam was performed according to the departmental dose-optimization program which includes automated exposure control, adjustment of the mA and/or kV according to patient size and/or  use of iterative reconstruction technique. Informed written consent was obtained from the patient after a discussion of the risks, benefits and alternatives to treatment. The patient was placed supine on the CT gantry and a pre procedural CT was performed re-demonstrating the known abscess/fluid collection within the perisplenic space with dominant component measuring approximally 12.4 x 7.1 cm (image 42, series 2). The CT gantry table position was marked and the collection was identified sonographically. The procedure was planned. A timeout was performed prior to the initiation of the procedure. The skin overlying the left upper abdominal  quadrant was prepped and draped in the usual sterile fashion. After the overlying soft tissues were anesthetized 1% lidocaine with epinephrine, the perisplenic fluid collection was targeted with an 18 gauge trocar needle yielding a return of purulent fluid. A short Amplatz wire was coiled within the collection. Appropriate positioning was confirmed with a limited CT scan. The tract was serially dilated allowing placement of a 12 [place] all-purpose drainage catheter. Appropriate positioning was confirmed with a limited postprocedural CT scan. Approximally 350 ml of purulent fluid was aspirated. The tube was connected to a drainage bag and sutured in place. A dressing was placed. The patient tolerated the procedure well without immediate post procedural complication. IMPRESSION: Successful CT guided placement of a 67 French all purpose drain catheter into the perisplenic abscess with aspiration of 350 mL of purulent fluid. Samples were sent to the laboratory as requested by the ordering clinical team. Electronically Signed   By: Sandi Mariscal M.D.   On: 10/12/2021 11:59    Medications:  sodium chloride 10 mL/hr at 10/09/21 1410   ceFEPime (MAXIPIME) IV Stopped (10/12/21 1115)   heparin 1,950 Units/hr (10/13/21 0113)   magnesium sulfate bolus IVPB     metronidazole 500 mg (10/13/21 0542)    amLODipine  10 mg Oral Daily   aspirin EC  81 mg Oral Daily   atorvastatin  10 mg Oral QHS   calcitRIOL  1 mcg Oral Q T,Th,Sa-HD   carvedilol  3.125 mg Oral BID WC   cinacalcet  30 mg Oral Q T,Th,Sa-HD   darbepoetin (ARANESP) injection - DIALYSIS  200 mcg Intravenous Q Sat-HD   diclofenac Sodium  2 g Topical QID   docusate sodium  100 mg Oral Daily   feeding supplement  1 Container Oral TID BM   gabapentin  100 mg Oral Q12H   gabapentin  200 mg Oral Q T,Th,Sa-HD   Gerhardt's butt cream   Topical BID   insulin aspart  1-3 Units Subcutaneous TID WC   metoCLOPramide (REGLAN) injection  5 mg Intravenous Q6H    multivitamin  1 tablet Oral QHS   pantoprazole  40 mg Oral BID   polyethylene glycol  17 g Oral BID   sodium chloride flush  10-40 mL Intracatheter Q12H   sodium chloride flush  3 mL Intravenous Q12H   sodium chloride flush  5 mL Intracatheter Q8H    Dialysis Orders: [name] TTS 4h 64min, BFR 400, EDW 151 kg, 1K/2.5Ca bath, TDC Hep 2,600 load and 1,500 hourly-Total dose 9275 units per treatment.  - mircera 200ug q2 due 09/10/21 - calcitriol 1 ug tiw - sensipar 30 mg tiw   Assessment/Plan: Septic shock: On admit requiring short-term pressors and again after TEE. Found to have bacteremia with bacteroides thetaiotaomicron and liver abscess and concern for splenic abscess. Appreciate ID - for now they do not feel that we need to remove the tunneled catheter. Remains on course of  Cefepime with HD and oral metronidazole.  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 Splenic/Hepatic abscesses: S/p drain placed in IR 09/29/2021.  ESRD: Continue HD per usual TTS schedule - next 7/11. Pt has requested 4 hour treatments instead of 4 hr 47min. Advised we can try 4 hour treatments as long as labs are acceptable and we are removing adequate amounts of fluid. No heparin with HD, on heparin drip for now. Chronic hypoxic resp failure - At baseline, she is on 3L O2 at home. HTN/volume:  Optimize UF with HD. She has now reached her old EDW but need to lower further. CXR repeated still shows vascular congestion.  Anemia of ESRD: Hgb 7.4 Received another unit prbcs on 7/7.   Marland Kitchen Continue Aranesp 200 mcg q Saturday. Transfused 1 unit of PRBCs with HD 09/26/2021.     Secondary Hyperparathyroidism: CorrCa/Phos ok, requesting to hold binder due to constipation but resume if phos trends back up. Continue VDRA + sensipar.   T2DM- per primary team Malnutrition in setting of Morbid Obesity - Albumin very low. Continue protein  supplements.   Courtney Child PA-C [facility] 10/13/2021,9:39 AM

## 2021-10-13 NOTE — Progress Notes (Addendum)
[facility]                                                                              Courtney Gray, is a 51 y.o. female, DOB - 1970-11-07, Courtney Gray Admit date - 09/09/2021    Outpatient Primary MD for the patient is Monico Blitz, MD  LOS - 33  days  Chief Complaint  Patient presents with   Nausea   Constipation       Brief summary   Courtney Gray is a 51 y.o. female with a history of PVD s/p right AKA 5/10 for critical limb ischemia, ESRD (HD TTS), chronic HFpEF (LVEF 55-60%, G2DD Feb 2023), IDT2DM, 2L O2-dependence, morbid obesity (BMI 55), who presented to the ED on 6/5 due to severe LUQ abdominal pain. CT revealing hypoenhancement in the spleen consistent with infarct. Started on IV heparin, required pressors briefly  for septic shock    Assessment & Plan   Septic shock/Bacteremia bacteroids thetaiotaomicron-beta-lactamase positive : -was briefly on pressors initially  -repeat blood culture on 6/23 no growth -Continue IV cefepime with dialysis and IV metronidazole -ID following - ID recommended line removal ( right side line and left side line) due to persistent leukocytosis, patient currently is declining   Hepatic and splenic abscess -Status post CT-guided drainage of liver abscess by IR on 6/25 , resolution of hepatic abscess on CT 7/3, drain removed  --CT abdomen pelvis repeated on 7/3 show near completion of previously seen hepatic fluid abscess. Large bilobed subcapsular fluid collection 10.7 cm still persisting.  Small volume perihepatic ascites, small to moderate size loculated left pleural effusion -s/p splenic abscess drain placement on 7/7 , drain being flushed q shift, drain management per IR -Continue IV cefepime, Flagyl -plan per IR/ID  Splenic infarct --TEE on 6/23 showed normal LV function, negative for endocarditis.  Calcified mass in the right atrium-etiology unclear, possibly calcified thrombus. -eliquis was placed on hold due  to subcapsular hematoma 5.9x10x8.9, on and off IV heparin due to anemia requiring frequent transfusion  -resume heparin drip on 7/8 now that hgb appears relatively stable   Left third digit ischemic gangrene ,amputation of left third digit on 6/19 by hand surgery, will need to contact hand surgery on Monday for follow up New left first and second toe gangrene,S/p left aka on 7/5, + wound vac for 10 days, vascular surgery following Severe PAD with prior right AKA in 08/2021 --Continue pain control, aspirin, statin  Diabetes mellitus type 2, insulin-dependent, uncontrolled with hypoglycemia -Hemoglobin A1c 5.7% -presents with hypoglycemia, home long acting insulin held -CBGs controlled, continue current meal coverage ,  HTN  Initially was hypotensive, requiring pressors, midodrine -currently bp stable on amlodipine, low-dose Coreg   Acute on chronic respiratory failure with hypoxia Suspected OHS, OSA, initially required bipap -Continue O2, 2 to 3 L via [place], at baseline -will benefit from outpatient sleep study   ESRD on hemodialysis, TTS -HD per nephrology   Anemia, multifactorial -Likely due to CKD, also had menorrhagia, now resolved -Status post 2 unit packed RBCs 5/23, 6/22 , 7/6 , 7/7 -Continue Aranesp.   -Hemoglobin 7.1, follow H&H in a.m., transfuse  if less than 7, Hold heparin if hgb less than 7, hold asa  Menorrhagia -Resolved, Megace discontinued  Demand ischemia with elevated troponin in the setting of anemia, sepsis -ACS ruled out.  Currently no chest pain.    Hyperlipidemia  -Continue statin   GERD  -continue PPI    Pressure ulcer stage II left and right buttock POA -Continue local wound care.  Change position every 2 hours  Morbid obesity Estimated body mass index is 53.53 kg/m as calculated from the following:   Height as of this encounter: 5\' 5"  (1.651 m).   Weight as of this encounter: 145.9 kg. OSA? Recommend outpatient sleep study   Acute on  chronic ambulatory dysfunction, now bilateral amputee and left middle finger amputation  -Continue PT OT, recommended SNF   FTT: Frequent hospitalizations, 6th admissions this year  Code Status: Full code DVT Prophylaxis:  SCD's Start: 10/09/21 1713  Currently on heparin drip  Level of Care: Level of care: Med-Surg Family Communication: Updated patient's daughter at the bedside on 10/11/2021.  Requested SNF when patient is medically ready   Disposition Plan:      Remains inpatient appropriate: On IV antibiotics, work-up in process, needs ID clearance   Consultants:   Nephrology Infectious disease Interventional radiology General surgery Vascular surgery Hand surgery Critical care Wound care  Antimicrobials:   Anti-infectives (From admission, onward)    Start     Dose/Rate Route Frequency Ordered Stop   09/25/21 0915  metroNIDAZOLE (FLAGYL) IVPB 500 mg        500 mg 100 mL/hr over 60 Minutes Intravenous Every 12 hours 09/25/21 0822     09/13/21 1000  vancomycin (VANCOCIN) IVPB 1000 mg/200 mL premix        1,000 mg 200 mL/hr over 60 Minutes Intravenous STAT 09/13/21 0904 09/13/21 1140   09/10/21 1200  vancomycin (VANCOCIN) IVPB 1000 mg/200 mL premix  Status:  Discontinued        1,000 mg 200 mL/hr over 60 Minutes Intravenous Every T-Th-Sa (Hemodialysis) 09/10/21 0620 09/17/21 1013   09/10/21 1200  ceFEPIme (MAXIPIME) 2 g in sodium chloride 0.9 % 100 mL IVPB        2 g 200 mL/hr over 30 Minutes Intravenous Every T-Th-Sa (Hemodialysis) 09/10/21 1107     09/10/21 0445  vancomycin (VANCOREADY) IVPB 2000 mg/400 mL        2,000 mg 200 mL/hr over 120 Minutes Intravenous  Once 09/10/21 0435 09/10/21 0800   09/10/21 0430  ceFEPIme (MAXIPIME) 2 g in sodium chloride 0.9 % 100 mL IVPB        2 g 200 mL/hr over 30 Minutes Intravenous  Once 09/10/21 0420 09/10/21 0550          Medications  amLODipine  10 mg Oral Daily   aspirin EC  81 mg Oral Daily   atorvastatin  10 mg Oral  QHS   calcitRIOL  1 mcg Oral Q T,Th,Sa-HD   carvedilol  3.125 mg Oral BID WC   cinacalcet  30 mg Oral Q T,Th,Sa-HD   darbepoetin (ARANESP) injection - DIALYSIS  200 mcg Intravenous Q Sat-HD   diclofenac Sodium  2 g Topical QID   docusate sodium  100 mg Oral Daily   feeding supplement  1 Container Oral TID BM   gabapentin  100 mg Oral Q12H   gabapentin  200 mg Oral Q T,Th,Sa-HD   Gerhardt's butt cream   Topical BID   insulin aspart  1-3 Units Subcutaneous TID WC  metoCLOPramide (REGLAN) injection  5 mg Intravenous Q6H   multivitamin  1 tablet Oral QHS   pantoprazole  40 mg Oral BID   polyethylene glycol  17 g Oral BID   sodium chloride flush  10-40 mL Intracatheter Q12H   sodium chloride flush  3 mL Intravenous Q12H   sodium chloride flush  5 mL Intracatheter Q8H      Subjective:   Courtney Gray was seen and examined.    Refused coreg and norvasc this am  She is sitting up in bed, NAD, aaox3  Constipated, agrees to try suppository tomorrow   She is on 4liter oxygen , which is her baseline     Objective:   Vitals:   10/12/21 2024 10/13/21 0349 10/13/21 0919 10/13/21 0937  BP: (!) 119/56 122/60 131/62 136/62  Pulse: 78 82 83 92  Resp: 15 20    Temp: 98.5 F (36.9 C) 98.8 F (37.1 C)  99.1 F (37.3 C)  TempSrc: Oral   Oral  SpO2: 98% 99%  94%  Weight:      Height:        Intake/Output Summary (Last 24 hours) at 10/13/2021 1042 Last data filed at 10/13/2021 0900 Gross per 24 hour  Intake 1706.56 ml  Output 10 ml  Net 1696.56 ml      Wt Readings from Last 3 Encounters:  10/12/21 (!) 145.9 kg  09/03/21 (!) 150.6 kg  08/28/21 (!) 150.1 kg   Physical Exam General: Alert and oriented x 3, NAD Cardiovascular: S1 S2 clear, RRR.  Respiratory: Diminished breath sound at the bases Gastrointestinal: Soft, nontender,  NBS, drain+ Ext: right AKA, left aka Neuro: no new deficits Skin: no rash Psych: Normal affect and demeanor, alert and oriented x3      Data Reviewed:  I have personally reviewed following labs    CBC Lab Results  Component Value Date   WBC 20.7 (H) 10/12/2021   RBC 2.62 (L) 10/12/2021   HGB 7.5 (L) 10/13/2021   HCT 25.1 (L) 10/13/2021   MCV 97.7 10/12/2021   MCH 28.2 10/12/2021   PLT 217 10/12/2021   MCHC 28.9 (L) 10/12/2021   RDW 21.3 (H) 10/12/2021   LYMPHSABS 1.0 09/28/2021   MONOABS 1.3 (H) 09/28/2021   EOSABS 0.0 09/28/2021   BASOSABS 0.0 34/74/2595     Last metabolic panel Lab Results  Component Value Date   NA 133 (L) 10/12/2021   K 4.0 10/12/2021   CL 96 (L) 10/12/2021   CO2 24 10/12/2021   BUN 28 (H) 10/12/2021   CREATININE 5.25 (H) 10/12/2021   GLUCOSE 120 (H) 10/12/2021   GFRNONAA 9 (L) 10/12/2021   GFRAA 6 (L) 11/29/2019   CALCIUM 7.3 (L) 10/12/2021   PHOS 5.1 (H) 10/12/2021   PROT 6.0 (L) 09/14/2021   ALBUMIN 2.0 (L) 10/12/2021   BILITOT 0.7 09/14/2021   ALKPHOS 124 09/14/2021   AST 16 09/14/2021   ALT 10 09/14/2021   ANIONGAP 13 10/12/2021    CBG (last 3)  Recent Labs    10/12/21 1610 10/12/21 2134 10/13/21 0733  GLUCAP 197* 125* 96       Courtney Gray M.D. PhD FACP [facility] 10/13/2021, 10:42 AM  Available via Epic secure chat 7am-7pm After 7 pm, please refer to night coverage provider listed on amion.

## 2021-10-13 NOTE — Plan of Care (Signed)
  Problem: Education: Goal: Ability to describe self-care measures that may prevent or decrease complications (Diabetes Survival Skills Education) will improve Outcome: Progressing Goal: Individualized Educational Video(s) Outcome: Progressing   Problem: Coping: Goal: Ability to adjust to condition or change in health will improve Outcome: Progressing   

## 2021-10-14 DIAGNOSIS — A419 Sepsis, unspecified organism: Secondary | ICD-10-CM

## 2021-10-14 DIAGNOSIS — D735 Infarction of spleen: Secondary | ICD-10-CM | POA: Diagnosis not present

## 2021-10-14 LAB — GLUCOSE, CAPILLARY
Glucose-Capillary: 83 mg/dL (ref 70–99)
Glucose-Capillary: 80 mg/dL (ref 70–99)
Glucose-Capillary: 134 mg/dL — ABNORMAL HIGH (ref 70–99)

## 2021-10-14 LAB — CBC
HCT: 22.4 % — ABNORMAL LOW (ref 36.0–46.0)
Hemoglobin: 6.4 g/dL — CL (ref 12.0–15.0)
MCH: 28.4 pg (ref 26.0–34.0)
MCHC: 28.6 g/dL — ABNORMAL LOW (ref 30.0–36.0)
MCV: 99.6 fL (ref 80.0–100.0)
Platelets: 158 10*3/uL (ref 150–400)
RBC: 2.25 MIL/uL — ABNORMAL LOW (ref 3.87–5.11)
RDW: 20.6 % — ABNORMAL HIGH (ref 11.5–15.5)
WBC: 14.3 10*3/uL — ABNORMAL HIGH (ref 4.0–10.5)
nRBC: 0.8 % — ABNORMAL HIGH (ref 0.0–0.2)

## 2021-10-14 LAB — HEMOGLOBIN AND HEMATOCRIT, BLOOD
HCT: 29.5 % — ABNORMAL LOW (ref 36.0–46.0)
Hemoglobin: 9 g/dL — ABNORMAL LOW (ref 12.0–15.0)

## 2021-10-14 LAB — SURGICAL PATHOLOGY

## 2021-10-14 LAB — HEPARIN LEVEL (UNFRACTIONATED): Heparin Unfractionated: 0.39 IU/mL (ref 0.30–0.70)

## 2021-10-14 LAB — PREPARE RBC (CROSSMATCH)

## 2021-10-14 MED ORDER — HEPARIN (PORCINE) 25000 UT/250ML-% IV SOLN
1950.0000 [IU]/h | INTRAVENOUS | Status: DC
  Administered 2021-10-14 – 2021-10-15 (×2): 1950 [IU]/h via INTRAVENOUS
  Filled 2021-10-14: qty 250

## 2021-10-14 MED ORDER — ALTEPLASE 2 MG IJ SOLR
2.0000 mg | Freq: Once | INTRAMUSCULAR | Status: AC
  Administered 2021-10-14: 2 mg
  Filled 2021-10-14: qty 2

## 2021-10-14 MED ORDER — SODIUM CHLORIDE 0.9% IV SOLUTION: Freq: Once | INTRAVENOUS | Status: AC

## 2021-10-14 MED ORDER — HEPARIN SODIUM (PORCINE) 1000 UNIT/ML DIALYSIS
1000.0000 [IU] | INTRAMUSCULAR | Status: DC | PRN
Start: 2021-10-14 — End: 2021-10-15
  Administered 2021-10-15: 4000 [IU] via INTRAVENOUS_CENTRAL
  Filled 2021-10-14 (×4): qty 1

## 2021-10-14 MED ORDER — ALTEPLASE 2 MG IJ SOLR: 2.0000 mg | Freq: Once | INTRAMUSCULAR | Status: DC | PRN

## 2021-10-14 NOTE — Progress Notes (Signed)
ANTICOAGULATION CONSULT NOTE  Pharmacy Consult for heparin Indication:  splenic infarct  Height: 5 ft 5 inches Weight : 150.4 kg Heparin Dosing Weight: 95 kg  Labs: Recent Labs    10/12/21 0315 10/12/21 1700 10/13/21 0504 10/14/21 0353 10/14/21 1540  HGB 7.4* 7.8* 7.5* 6.4* 9.0*  HCT 25.6* 25.8* 25.1* 22.4* 29.5*  PLT 217  --   --  158  --   HEPARINUNFRC  --  0.35 0.37 0.39  --   CREATININE 5.25*  --   --   --   --      Assessment: 61 yof with hx PVD s/p R AKA 5/10 for critical limb ischemia, ESRD on HD TTS, found to have splenic infarct and transitioned from heparin infusion to apixaban on 6/14. Pharmacy consulted to transition back to heparin infusion with patient hypotensive requiring transfer to ICU s/p TEE on 6/23. Last dose of apixaban 6/23 at 1048. She is s/p IR drain placement for hepatic abscess and 6/23 noted CT finding of possible subcapsular hematoma so heparin goal was lowered.  Heparin now resumed after HgB improved   Goal of Therapy:  Heparin level 0.3-0.5 units/ml Monitor platelets by anticoagulation protocol: Yes   PLAN:     Restart heparin at 1950 units / hr Follow up AM labs  Thank you Anette Guarneri, PharmD  10/14/2021 4:48 PM  **Pharmacist phone directory can be found on [email or web address] listed under [place]**

## 2021-10-14 NOTE — Progress Notes (Signed)
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 (HCC)   Elevated troponin level not due myocardial infarction   Hypotension   PVD (peripheral vascular disease) (HCC)   Hx of AKA (above knee amputation), right (HCC)   Pressure injury of skin   Hepatic abscess          Assessment: 73 YF admitted with:  #Hepatic abscess status post drain placement-resolved #Subcapsular fluid collection/splenic infarcts-increase in size SP drian placement on 7/7 #Bacteroides bacteremia on 6/17(cleared on 6/23) 2/2 CLABSI vs  intraabdominal abscess #Persistent leukocytosis-trending down #Peripheral gangrene(left second toe) SP left AKA on 7/5 #ESRD on IHD via RIJ -RIJ placed on 6/7, failed avf -Patient had drain placed hepatic abscess(CT notes hepatic collection on 6/23), cultures negative. -CT on 7/3 showed complete resolution of hepatic collection/abscess.  Large bilobed subcapsular fluid collection in spleen measuring 10.7 cm small-to-moderate loculated left pleural effusion.  Perinephric stranding and fluid associated with both kidneys is increased - Leukocytosis trending down following AKA and splenic drain placement(7/7). Splenic Cx+ Staph epi whic I suspect is contamination. Recommendations: -Follow splenic abscess Cx -Continue cefepime and metronidazole -Line removal would remove line: RIJ and CVC in the setting of persistent leukocytosis during hospitalization.   Microbiology:    Antibiotics: Cefepime and metronidazole  Cultures: Blood 6/25 1/2 bacteroides thetaiotaomicron  Other 6/17 hepatic abscess Cx NG 6/25 NG  SUBJECTIVE: Resting in bed. No new complaints.  Interval:wbc 14k. afebrile overnight.   Review of Systems: Review of Systems  All other systems reviewed and are negative.    Scheduled Meds:  amLODipine  10 mg Oral Daily   atorvastatin  10 mg Oral QHS   calcitRIOL  1 mcg Oral Q T,Th,Sa-HD   carvedilol  3.125 mg Oral BID WC   cinacalcet  30 mg Oral Q T,Th,Sa-HD   darbepoetin (ARANESP) injection - DIALYSIS  200 mcg Intravenous Q Sat-HD   diclofenac Sodium  2 g Topical QID   docusate sodium  100 mg Oral Daily   feeding supplement  1 Container Oral TID BM   gabapentin  100 mg Oral Q12H   gabapentin  200 mg Oral Q T,Th,Sa-HD   Gerhardt's butt cream   Topical BID   insulin aspart  1-3 Units Subcutaneous TID WC   metoCLOPramide (REGLAN) injection  5 mg Intravenous Q6H   multivitamin  1 tablet Oral QHS   pantoprazole  40 mg Oral BID   polyethylene glycol  17 g Oral BID   sodium chloride flush  10-40 mL Intracatheter Q12H   sodium chloride flush  3 mL Intravenous Q12H   sodium chloride flush  5 mL Intracatheter Q8H   Continuous Infusions:  sodium chloride 10 mL/hr at 10/09/21 1410   ceFEPime (MAXIPIME) IV Stopped (10/12/21 1115)   magnesium sulfate bolus IVPB     metronidazole 500 mg (10/14/21 0458)   PRN Meds:.acetaminophen **OR** acetaminophen, albuterol, alteplase, alum & mag hydroxide-simeth, bisacodyl, diphenhydrAMINE, fentaNYL, glucagon (human recombinant), glucose,  guaiFENesin-dextromethorphan, heparin, hydrALAZINE, HYDROmorphone (DILAUDID) injection, HYDROmorphone, labetalol, lactulose, lidocaine (PF), lidocaine-prilocaine, loperamide, magnesium sulfate bolus IVPB, midazolam, naLOXone (NARCAN)  injection, nitroGLYCERIN, ondansetron **OR** ondansetron (ZOFRAN) IV, oxyCODONE, oxyCODONE, pentafluoroprop-tetrafluoroeth, phenol,  polyethylene glycol, potassium chloride, prochlorperazine, senna-docusate Allergies  Allergen Reactions   Benzonatate Other (See Comments)    Hallucinations   Hydralazine Other (See Comments)    Hallucinations   Amoxicillin Itching and Nausea And Vomiting   Clonidine Other (See Comments)    Altered mental state, insomnia    Chlorhexidine Itching    Patient reports it is the CHG baths that cause her itching not the CHG used for caring for her IV/HD access.     Morphine Itching   Oxycodone Itching    Pt tolerated hospital admission 07/2021    OBJECTIVE: Vitals:   10/14/21 0542 10/14/21 0543 10/14/21 1014 10/14/21 1048  BP:  125/63 117/65 (!) (P) 113/56  Pulse:  80 (!) 59 (P) 79  Resp:  18 16 (P) 16  Temp:  99.4 F (37.4 C) 98.9 F (37.2 C) (P) 99.3 F (37.4 C)  TempSrc:   Oral   SpO2:  100% 99% (P) 100%  Weight: (!) 147.3 kg     Height:       Body mass index is 54.04 kg/m.  Physical Exam Constitutional:      Appearance: Normal appearance.  HENT:     Head: Normocephalic and atraumatic.     Right Ear: Tympanic membrane normal.     Left Ear: Tympanic membrane normal.     Nose: Nose normal.     Mouth/Throat:     Mouth: Mucous membranes are moist.  Eyes:     Extraocular Movements: Extraocular movements intact.     Conjunctiva/sclera: Conjunctivae normal.     Pupils: Pupils are equal, round, and reactive to light.  Cardiovascular:     Rate and Rhythm: Normal rate and regular rhythm.     Heart sounds: No murmur heard.    No friction rub. No gallop.  Pulmonary:     Effort: Pulmonary effort is normal.     Breath sounds: Normal breath sounds.  Abdominal:     General: Abdomen is flat.     Palpations: Abdomen is soft.  Musculoskeletal:        General: Normal range of motion.     Comments: Left middle finger wound Left AKA  Skin:    General: Skin is warm and dry.  Neurological:     General: No focal deficit present.     Mental Status: She is alert and oriented to  person, place, and time.  Psychiatric:        Mood and Affect: Mood normal.       Lab Results Lab Results  Component Value Date   WBC 14.3 (H) 10/14/2021   HGB 6.4 (LL) 10/14/2021   HCT 22.4 (L) 10/14/2021   MCV 99.6 10/14/2021   PLT 158 10/14/2021    Lab Results  Component Value Date   CREATININE 5.25 (H) 10/12/2021   BUN 28 (H) 10/12/2021   NA 133 (L) 10/12/2021   K 4.0 10/12/2021   CL 96 (L) 10/12/2021   CO2 24 10/12/2021    Lab Results  Component Value Date   ALT 10 09/14/2021   AST 16 09/14/2021   ALKPHOS 124 09/14/2021   BILITOT 0.7 09/14/2021        Laurice Record, [place] for Infectious [facility] 10/14/2021, 12:19 PM

## 2021-10-14 NOTE — Progress Notes (Signed)
[facility] Progress Note   Subjective:    Seen and examined patient at bedside. Noted Hgb 6.4 this AM and 1 unit PRBCs already ordered. No new complaints. Denies SOB, CP, and N/V. Plan for HD 7/11.  Objective Vitals:   10/13/21 2114 10/14/21 0542 10/14/21 0543 10/14/21 1014  BP: 119/67  125/63 117/65  Pulse: 82  80 (!) 59  Resp: 18  18 16   Temp: 98.1 F (36.7 C)  99.4 F (37.4 C) 98.9 F (37.2 C)  TempSrc:    Oral  SpO2: 100%  100% 99%  Weight:  (!) 147.3 kg    Height:       Physical Exam General: Ill-appearing; obese; on 4L [place] Heart: S1 and S2; No murmurs, gallops, or rubs Lungs: Clear uppers anteriorly and posteriorly Abdomen: Obese, soft Extremities: R AKA; L AKA-wound vac in place; L hand with ace wrap in place Dialysis Access: R [facility]   Filed Weights   10/12/21 0727 10/12/21 1150 10/14/21 0542  Weight: (!) 152 kg (!) 145.9 kg (!) 147.3 kg    Intake/Output Summary (Last 24 hours) at 10/14/2021 1053 Last data filed at 10/14/2021 0900 Gross per 24 hour  Intake 1094.6 ml  Output 0 ml  Net 1094.6 ml    Additional Objective Labs: Basic Metabolic Panel: Recent Labs  Lab 10/10/21 0330 10/11/21 0503 10/12/21 0315  NA 130* 134* 133*  K 4.9 4.0 4.0  CL 95* 96* 96*  CO2 23 28 24   GLUCOSE 193* 160* 120*  BUN 25* 20 28*  CREATININE 5.89* 4.33* 5.25*  CALCIUM 7.9* 7.8* 7.3*  PHOS 6.7* 4.6 5.1*   Liver Function Tests: Recent Labs  Lab 10/10/21 0330 10/11/21 0503 10/12/21 0315  ALBUMIN 2.0* 1.9* 2.0*   No results for input(s): "LIPASE", "AMYLASE" in the last 168 hours. CBC: Recent Labs  Lab 10/09/21 0050 10/09/21 1342 10/10/21 0330 10/10/21 1615 10/11/21 0503 10/11/21 2031 10/12/21 0315 10/12/21 1700 10/13/21 0504 10/14/21 0353  WBC 21.3*  --  31.0*  --  30.3*  --  20.7*  --   --  14.3*  HGB 7.3*   < > 6.6*   < > 6.7*   < > 7.4* 7.8* 7.5* 6.4*  HCT 25.6*   < > 22.9*   < > 23.1*   < > 25.6* 25.8* 25.1* 22.4*  MCV 99.6  --  98.3  --   98.3  --  97.7  --   --  99.6  PLT 307  --  310  --  242  --  217  --   --  158   < > = values in this interval not displayed.   Blood Culture    Component Value Date/Time   SDES ABSCESS 10/11/2021 1700   SPECREQUEST SPLEEN 10/11/2021 1700   CULT  10/11/2021 1700    RARE STAPHYLOCOCCUS EPIDERMIDIS SUSCEPTIBILITIES TO FOLLOW NO ANAEROBES ISOLATED; CULTURE IN PROGRESS FOR 5 DAYS    REPTSTATUS PENDING 10/11/2021 1700    Cardiac Enzymes: No results for input(s): "CKTOTAL", "CKMB", "CKMBINDEX", "TROPONINI" in the last 168 hours. CBG: Recent Labs  Lab 10/12/21 2134 10/13/21 0733 10/13/21 1143 10/13/21 1646 10/14/21 0724  GLUCAP 125* 96 134* 147* 83   Iron Studies: No results for input(s): "IRON", "TIBC", "TRANSFERRIN", "FERRITIN" in the last 72 hours. Lab Results  Component Value Date   INR 1.3 (H) 10/09/2021   INR 1.3 (H) 09/10/2021   INR 1.3 (H) 07/10/2021   Studies/Results: No results found.  Medications:  sodium chloride 10 mL/hr at 10/09/21 1410   ceFEPime (MAXIPIME) IV Stopped (10/12/21 1115)   magnesium sulfate bolus IVPB     metronidazole 500 mg (10/14/21 0458)    amLODipine  10 mg Oral Daily   atorvastatin  10 mg Oral QHS   calcitRIOL  1 mcg Oral Q T,Th,Sa-HD   carvedilol  3.125 mg Oral BID WC   cinacalcet  30 mg Oral Q T,Th,Sa-HD   darbepoetin (ARANESP) injection - DIALYSIS  200 mcg Intravenous Q Sat-HD   diclofenac Sodium  2 g Topical QID   docusate sodium  100 mg Oral Daily   feeding supplement  1 Container Oral TID BM   gabapentin  100 mg Oral Q12H   gabapentin  200 mg Oral Q T,Th,Sa-HD   Gerhardt's butt cream   Topical BID   insulin aspart  1-3 Units Subcutaneous TID WC   metoCLOPramide (REGLAN) injection  5 mg Intravenous Q6H   multivitamin  1 tablet Oral QHS   pantoprazole  40 mg Oral BID   polyethylene glycol  17 g Oral BID   sodium chloride flush  10-40 mL Intracatheter Q12H   sodium chloride flush  3 mL Intravenous Q12H   sodium chloride  flush  5 mL Intracatheter Q8H    Dialysis Orders: [name] TTS 4h 49min, BFR 400, EDW 151 kg, 1K/2.5Ca bath, TDC Hep 2,600 load and 1,500 hourly-Total dose 9275 units per treatment.  - mircera 200ug q2 due 09/10/21 - calcitriol 1 ug tiw - sensipar 30 mg tiw  Assessment/Plan: Septic shock: On admit requiring short-term pressors and again after TEE. Found to have bacteremia with bacteroides thetaiotaomicron and liver abscess and concern for splenic abscess. Appreciate ID - for now they do not feel that we need to remove the tunneled catheter. Remains on course of Cefepime with HD and oral metronidazole.  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 Splenic/Hepatic abscesses: S/p drain placed in IR 09/29/2021.  ESRD: Continue HD per usual TTS schedule - next 7/11. Pt has requested 4 hour treatments instead of 4 hr 60min. Advised we can try 4 hour treatments as long as labs are acceptable and we are removing adequate amounts of fluid. No heparin with HD, on heparin drip for now. Chronic hypoxic resp failure - At baseline, she is on 3L O2 at home. HTN/volume:  Optimize UF with HD. She has now reached her old EDW but need to lower further. CXR repeated still shows vascular congestion.  Anemia of ESRD: S/p 5 units PRBCs during this hospitalization. Hgb now 6.4-ordered another unit today. Will check CBC after transfusion complete. Continue Aranesp 200 mcg q Saturday. Continue to transfuse for Hgb after 7.     Secondary Hyperparathyroidism: CorrCa/Phos ok, requesting to hold binder due to constipation but resume if phos trends back up. Continue VDRA + sensipar.   T2DM- per primary team Malnutrition in setting of Morbid Obesity - Albumin very low. Continue protein supplements.   Tobie Poet, NP [facility] 10/14/2021,10:53 AM  LOS: 34 days

## 2021-10-14 NOTE — TOC Progression Note (Signed)
Transition of Care [facility]) - Initial/Assessment Note    Patient Details  Name: Courtney Gray MRN: [id number] Date of Birth: 09-Jun-1970  [facility] CM/SW Contact:    Milinda Antis, LCSWA Phone Number: 10/14/2021, 12:21 PM  Clinical Narrative:                 CSW contacted Minette Headland from [facility] in [facility] and was informed that the facility may have a LTC bed available.  The facility is checking on the ability to transport the patient to dialysis and will return call to [place].    Expected Discharge Plan: Skilled Nursing Facility Barriers to Discharge: Continued Medical Work up   Patient Goals and CMS Choice Patient states their goals for this hospitalization and ongoing recovery are:: SNF CMS Medicare.gov Compare Post Acute Care list provided to:: Patient Choice offered to / list presented to : Patient, Adult Children (Daughter, Antionette)  Expected Discharge Plan and Services Expected Discharge Plan: Skilled Nursing Facility In-house Referral: Clinical Social Work Discharge Planning Services: CM Consult Post Acute Care Choice: Durable Medical Equipment Living arrangements for the past 2 months: Single Family Home                                      Prior Living Arrangements/Services Living arrangements for the past 2 months: Single Family Home Lives with:: Adult Children Patient language and need for interpreter reviewed:: Yes Do you feel safe going back to the place where you live?: No   SNF  Need for Family Participation in Patient Care: Yes (Comment) Care giver support system in place?: Yes (comment) Current home services: DME (Cane, w/c, shower sest, rollator, home O2) Criminal Activity/Legal Involvement Pertinent to Current Situation/Hospitalization: No - Comment as needed  Activities of Daily Living Home Assistive Devices/Equipment: Walker (specify type) ADL Screening (condition at time of admission) Patient's cognitive ability  adequate to safely complete daily activities?: No Is the patient deaf or have difficulty hearing?: No Does the patient have difficulty seeing, even when wearing glasses/contacts?: No Does the patient have difficulty concentrating, remembering, or making decisions?: No Patient able to express need for assistance with ADLs?: Yes Does the patient have difficulty dressing or bathing?: Yes Independently performs ADLs?: No Communication: Independent Dressing (OT): Dependent Is this a change from baseline?: Pre-admission baseline Grooming: Needs assistance Is this a change from baseline?: Pre-admission baseline Feeding: Independent Bathing: Dependent Is this a change from baseline?: Pre-admission baseline Toileting: Dependent Is this a change from baseline?: Pre-admission baseline In/Out Bed: Dependent Is this a change from baseline?: Pre-admission baseline Walks in Home: Dependent Is this a change from baseline?: Change from baseline, expected to last >3 days Does the patient have difficulty walking or climbing stairs?: Yes Weakness of Legs: Both Weakness of Arms/Hands: Both  Permission Sought/Granted Permission sought to share information with : Case Manager, Family Supports, Chartered certified accountant granted to share information with : Yes, Verbal Permission Granted  Share Information with NAME: Antoinette  Permission granted to share info w AGENCY: SNF  Permission granted to share info w Relationship: daughter  Permission granted to share info w Contact Information: Lorenso Courier [phone]  Emotional Assessment Appearance:: Appears stated age Attitude/Demeanor/Rapport: Gracious Affect (typically observed): Calm Orientation: : Oriented to Self, Oriented to Place, Oriented to  Time, Oriented to Situation (WDL) Alcohol / Substance Use: Not Applicable Psych Involvement: No (comment)  Admission diagnosis:  Splenic  infarction [D73.5] Hypoglycemia [E16.2] ESRD (end  stage renal disease) on dialysis ([place]) [N18.6, Z99.2] Hypotension [I95.9] Sepsis, due to unspecified organism, unspecified whether acute organ dysfunction present [facility]) [A41.9] Left upper quadrant abdominal pain [R10.12] Patient Active Problem List   Diagnosis Date Noted   Hepatic abscess    Pressure injury of skin 09/13/2021   PVD (peripheral vascular disease) (HCC)    Hx of AKA (above knee amputation), right ([place])    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   Pneumonia of left lower lobe due to infectious organism 09/10/2021   Lactic acidosis 09/10/2021   Mixed diabetic hyperlipidemia associated with type 2 diabetes mellitus ([place]) 09/10/2021   Coronary artery disease involving native coronary artery of native heart without angina pectoris 09/10/2021   Elevated troponin level not due myocardial infarction 09/10/2021   Hypotension 09/10/2021   Dry gangrene ([place])    Pain 08/03/2021   Ischemic foot pain at rest 08/03/2021   Hypoglycemia 07/10/2021   Volume overload 07/09/2021   Acute pulmonary edema (HCC)    Leukocytosis 06/11/2021   Acute gout 06/11/2021   Hyperkalemia 05/21/2021   Hemorrhoids    Hyperlipidemia 05/18/2021   Rectal bleeding 05/17/2021   Morbid obesity with BMI of 60.0-69.9, adult ([place]) 05/17/2021   NSTEMI (non-ST elevated myocardial infarction) ([place]) 05/16/2021   Essential hypertension 09/01/2020   ESRD (end stage renal disease) on dialysis ([place]) 09/01/2020   Chronic respiratory failure with hypoxia ([place]) 09/01/2020   Hypertensive urgency 11/28/2019   Uncontrolled type 2 diabetes mellitus with hypoglycemia, with long-term current use of insulin ([place]) 11/28/2019   Anemia of chronic disease 11/28/2019   PCP:  Monico Blitz, MD Pharmacy:   [place], [place] [address] [place] [address] Phone: [phone] Fax: [phone]  [facility] [address] [place] [address] Phone: [phone]  Fax: [phone]     Social Determinants of Health (SDOH) Interventions    Readmission Risk Interventions    09/16/2021    4:36 PM 08/28/2021   11:50 AM 07/12/2021   12:38 PM  Readmission Risk Prevention Plan  Transportation Screening Complete Complete Complete  Medication Review (RN Care Manager)  Complete Complete  PCP or Specialist appointment within 3-5 days of discharge  Complete Complete  HRI or Home Care Consult Complete Complete Complete  SW Recovery Care/Counseling Consult Complete Complete Complete  Palliative Care Screening  Not Applicable Not Applicable  Skilled Nursing Facility Complete Not Applicable Complete

## 2021-10-14 NOTE — Progress Notes (Signed)
Pt appoximately 3 weeks s/p left middle finger amputation through the proximal phalanx. The amputation site is warm, dry, and well healed.  The absorbable sutures are in place. We discussed continued care of the amputation site with warm, soapy water.  She can cover the end of the amputation site with a bandaid if needed to prevent sutures from catching on bedding. Otherwise no dressing needed.  Sherilyn Cooter, M.D. [place]

## 2021-10-14 NOTE — Progress Notes (Signed)
Supervising Physician: Ruthann Cancer  Patient Status:  [facility] - In-pt  Chief Complaint:  Splenic abscess drain on 10/11/21 with Dr. Pascal Lux  Subjective:  No complaints today.  Seated in bed with daughter at bedside.   Allergies: Benzonatate, Hydralazine, Amoxicillin, Clonidine, Chlorhexidine, Morphine, and Oxycodone  Medications: Prior to Admission medications   Medication Sig Start Date End Date Taking? Authorizing Provider  acetaminophen (TYLENOL) 500 MG tablet Take 1,000 mg by mouth as needed for moderate pain.   Yes [provider]  amLODipine (NORVASC) 10 MG tablet Take 1 tablet (10 mg total) by mouth daily. Patient taking differently: Take 10 mg by mouth daily as needed (Blood pressusre). 11/29/20  Yes Roxan Hockey, MD  aspirin EC 81 MG tablet Take 1 tablet (81 mg total) by mouth daily with breakfast. 11/28/20  Yes Emokpae, Courage, MD  atorvastatin (LIPITOR) 10 MG tablet Take 1 tablet (10 mg total) by mouth daily. Patient taking differently: Take 10 mg by mouth at bedtime. 11/28/20  Yes Emokpae, Courage, MD  calcium carbonate (TUMS SMOOTHIES) 750 MG chewable tablet Chew 1,500 mg by mouth See admin instructions. Take 1500 mg with each meal and 1500 mg with each snack   Yes [provider]  carvedilol (COREG) 6.25 MG tablet Take 1 tablet (6.25 mg total) by mouth 2 (two) times daily with a meal. Patient taking differently: Take 6.25 mg by mouth 2 (two) times daily as needed (if BP > 150). 08/28/21 09/27/21 Yes Pahwani, Einar Grad, MD  diphenhydrAMINE (BENADRYL) 25 MG tablet Take 25 mg by mouth 2 (two) times daily as needed for allergies.   Yes [provider]  docusate sodium (COLACE) 100 MG capsule Take 100 mg by mouth 2 (two) times daily.   Yes [provider]  gabapentin (NEURONTIN) 100 MG capsule Take 1 capsule (100 mg total) by mouth 2 (two) times daily. Patient taking differently: Take 100 mg by mouth See admin instructions. Take 100 mg by mouth  twice a day on non dialysis days. On dialysis day take 100 mg in the morning, 200 mg after dialysis, and then 100 mg at bedtime. 08/28/21 09/27/21 Yes Pahwani, Einar Grad, MD  glipiZIDE (GLUCOTROL XL) 2.5 MG 24 hr tablet Take 1 tablet (2.5 mg total) by mouth daily with breakfast. 08/28/21 09/27/21 Yes Pahwani, Einar Grad, MD  HYDROcodone-acetaminophen (NORCO/VICODIN) 5-325 MG tablet Take 1-2 tablets by mouth every 4 (four) hours as needed for moderate pain. Patient taking differently: Take 1 tablet by mouth every 4 (four) hours as needed for moderate pain. 08/19/21  Yes Pahwani, Einar Grad, MD  insulin glargine (LANTUS) 100 UNIT/ML Solostar Pen Inject 12 Units into the skin daily.   Yes [provider]  loperamide (IMODIUM A-D) 2 MG tablet Take 4 mg by mouth as needed for diarrhea or loose stools.   Yes [provider]  nitroGLYCERIN (NITROSTAT) 0.4 MG SL tablet Place 1 tablet (0.4 mg total) under the tongue every 5 (five) minutes as needed for chest pain. 05/24/21  Yes [place] Eric J, DO  pantoprazole (PROTONIX) 40 MG tablet Take 1 tablet (40 mg total) by mouth daily. 11/29/20  Yes Roxan Hockey, MD  simethicone (MYLICON) 161 MG chewable tablet Chew 125 mg by mouth in the morning and at bedtime.   Yes [provider]  tiZANidine (ZANAFLEX) 4 MG capsule Take 4 mg by mouth in the morning and at bedtime.   Yes [provider]  doxycycline (VIBRAMYCIN) 100 MG capsule Take 100 mg by mouth 2 (two)  times daily. Patient not taking: Reported on 09/10/2021 08/30/21   [provider]  lisinopril (ZESTRIL) 20 MG tablet Take 1 tablet (20 mg total) by mouth daily. Patient not taking: Reported on 09/10/2021 08/28/21 09/27/21  Darliss Cheney, MD  traMADol (ULTRAM) 50 MG tablet Take 1 tablet (50 mg total) by mouth every 6 (six) hours as needed for moderate pain. Patient not taking: Reported on 09/10/2021 08/28/21   Darliss Cheney, MD     Vital Signs: BP (!) 150/65   Pulse 83   Temp 99.5 F (37.5  C)   Resp 18   Ht 5\' 5"  (1.651 m)   Wt (!) 324 lb 11.8 oz (147.3 kg)   SpO2 100%   BMI 54.04 kg/m   Physical Exam Constitutional:      General: She is not in acute distress.    Appearance: She is obese. She is not toxic-appearing.  HENT:     Head: Normocephalic and atraumatic.     Mouth/Throat:     Pharynx: Oropharynx is clear.  Eyes:     Extraocular Movements: Extraocular movements intact.     Conjunctiva/sclera: Conjunctivae normal.  Abdominal:     Palpations: Abdomen is soft.     Tenderness: There is no abdominal tenderness.  Neurological:     General: No focal deficit present.     Mental Status: She is alert.  Psychiatric:        Mood and Affect: Mood normal.        Behavior: Behavior normal.    Drain Location: LUQ Size: Fr size: 12 Fr Date of placement: 10/11/21 Currently to: Drain collection device: gravity 24 hour output:  Output by Drain (mL) 10/12/21 0700 - 10/12/21 1459 10/12/21 1500 - 10/12/21 2259 10/12/21 2300 - 10/13/21 0659 10/13/21 0700 - 10/13/21 1459 10/13/21 1500 - 10/13/21 2259 10/13/21 2300 - 10/14/21 0659 10/14/21 0700 - 10/14/21 1459 10/14/21 1500 - 10/14/21 1649  Closed System Drain 1 LUQ Other (Comment) 12 Fr.   10       Negative Pressure Wound Therapy Leg Distal;Left     0       Current examination: Flushes/aspirates easily.   Insertion site unremarkable. Suture and stat lock in place. Dressing soiled.   Imaging: CT IMAGE GUIDED DRAINAGE BY PERCUTANEOUS CATHETER  Result Date: 10/12/2021 INDICATION: History of multifocal infection, post percutaneous hepatic abscess drainage catheter placement on 09/29/2021. Patient presents today for image guided placement of a perisplenic abscess drainage catheter for infection source control purposes. EXAM: ULTRASOUND AND CT-GUIDED PERISPLENIC ABSCESS DRAINAGE CATHETER PLACEMENT COMPARISON:  CT abdomen and pelvis-10/07/2021 MEDICATIONS: The patient is currently admitted to the hospital and receiving  intravenous antibiotics. The antibiotics were administered within an appropriate time frame prior to the initiation of the procedure. ANESTHESIA/SEDATION: Moderate (conscious) sedation was employed during this procedure as administered by the Interventional Radiology RN. A total of Versed 3 mg and Fentanyl 150 mcg was administered intravenously. Moderate Sedation Time: 25 minutes. The patient's level of consciousness and vital signs were monitored continuously by radiology nursing throughout the procedure under my direct supervision. CONTRAST:  None COMPLICATIONS: None immediate. PROCEDURE: RADIATION DOSE REDUCTION: This exam was performed according to the departmental dose-optimization program which includes automated exposure control, adjustment of the mA and/or kV according to patient size and/or use of iterative reconstruction technique. Informed written consent was obtained from the patient after a discussion of the risks, benefits and alternatives to treatment. The patient was placed supine on the CT gantry and a pre  procedural CT was performed re-demonstrating the known abscess/fluid collection within the perisplenic space with dominant component measuring approximally 12.4 x 7.1 cm (image 42, series 2). The CT gantry table position was marked and the collection was identified sonographically. The procedure was planned. A timeout was performed prior to the initiation of the procedure. The skin overlying the left upper abdominal quadrant was prepped and draped in the usual sterile fashion. After the overlying soft tissues were anesthetized 1% lidocaine with epinephrine, the perisplenic fluid collection was targeted with an 18 gauge trocar needle yielding a return of purulent fluid. A short Amplatz wire was coiled within the collection. Appropriate positioning was confirmed with a limited CT scan. The tract was serially dilated allowing placement of a 12 [place] all-purpose drainage catheter. Appropriate  positioning was confirmed with a limited postprocedural CT scan. Approximally 350 ml of purulent fluid was aspirated. The tube was connected to a drainage bag and sutured in place. A dressing was placed. The patient tolerated the procedure well without immediate post procedural complication. IMPRESSION: Successful CT guided placement of a 74 French all purpose drain catheter into the perisplenic abscess with aspiration of 350 mL of purulent fluid. Samples were sent to the laboratory as requested by the ordering clinical team. Electronically Signed   By: Sandi Mariscal M.D.   On: 10/12/2021 11:59    Labs:  CBC: Recent Labs    10/10/21 0330 10/10/21 1615 10/11/21 0503 10/11/21 2031 10/12/21 0315 10/12/21 1700 10/13/21 0504 10/14/21 0353 10/14/21 1540  WBC 31.0*  --  30.3*  --  20.7*  --   --  14.3*  --   HGB 6.6*   < > 6.7*   < > 7.4* 7.8* 7.5* 6.4* 9.0*  HCT 22.9*   < > 23.1*   < > 25.6* 25.8* 25.1* 22.4* 29.5*  PLT 310  --  242  --  217  --   --  158  --    < > = values in this interval not displayed.    COAGS: Recent Labs    07/10/21 0627 09/10/21 0403 09/28/21 0151 09/30/21 0500 09/30/21 1049 10/01/21 0847 10/02/21 0621 10/09/21 1219  INR 1.3* 1.3*  --   --   --   --   --  1.3*  APTT  --   --    < > 85* 72* 69* 65*  --    < > = values in this interval not displayed.    BMP: Recent Labs    10/09/21 0050 10/09/21 1342 10/10/21 0330 10/11/21 0503 10/12/21 0315  NA 132* 131* 130* 134* 133*  K 3.6 3.7 4.9 4.0 4.0  CL 94* 95* 95* 96* 96*  CO2 25  --  23 28 24   GLUCOSE 127* 88 193* 160* 120*  BUN 18 20 25* 20 28*  CALCIUM 8.3*  --  7.9* 7.8* 7.3*  CREATININE 4.44* 5.50* 5.89* 4.33* 5.25*  GFRNONAA 11*  --  8* 12* 9*    LIVER FUNCTION TESTS: Recent Labs    09/03/21 2040 09/10/21 0031 09/10/21 0920 09/11/21 0324 09/14/21 0636 09/19/21 0915 10/09/21 0050 10/10/21 0330 10/11/21 0503 10/12/21 0315  BILITOT 0.4 0.7  --  0.8 0.7  --   --   --   --   --   AST  23 18  --  30 16  --   --   --   --   --   ALT 16 11  --  11 10  --   --   --   --   --  ALKPHOS 77 79  --  76 124  --   --   --   --   --   PROT 7.7 7.8  --  7.1 6.0*  --   --   --   --   --   ALBUMIN 3.0* 2.8*   < > 2.4* 1.9*  1.9*   < > 1.9* 2.0* 1.9* 2.0*   < > = values in this interval not displayed.    Assessment and Plan:  Courtney Gray, 51 yo female with complex medical issues who developed splenic abscess requiring IR drain placement on 10/11/21 with Dr. Pascal Lux.  Continue TID flushes with 5 cc NS. Record output Q shift. Dressing changes QD or PRN if soiled.   Call IR APP or on call IR MD if difficulty flushing or sudden change in drain output.  Repeat imaging/possible drain injection once output < 10 mL/QD (excluding flush material). Consideration for drain removal if output is < 10 mL/QD (excluding flush material), pending discussion with the providing surgical service.  Discharge planning: Please contact IR APP or on call IR MD prior to patient d/c to ensure appropriate follow up plans are in place. Typically patient will follow up with IR clinic 10-14 days post d/c for repeat imaging/possible drain injection. IR scheduler will contact patient with date/time of appointment. Patient will need to flush drain QD with 5 cc NS, record output QD, dressing changes every 2-3 days or earlier if soiled.   IR will continue to follow - please call with questions or concerns.  Electronically Signed: Pasty Spillers, PA 10/14/2021, 4:43 PM   I spent a total of 25 Minutes at the the patient's bedside AND on the patient's hospital floor or unit, greater than 50% of which was counseling/coordinating care for drain management

## 2021-10-14 NOTE — Progress Notes (Signed)
[facility] Progress Note  Patient: Courtney Gray     WNU:[id number]  DOA: 09/09/2021   PCP: Monico Blitz, MD       Brief hospital course: This is a 51 year old female with end-stage renal disease, chronic HFpEF with grade 2 diastolic dysfunction, insulin requiring diabetes mellitus, morbid obesity chronically on 2 L of oxygen, peripheral vascular disease status post right AKA on 5/10 for critical limb ischemia. The patient presented to the ED on 09/09/2021 with severe left upper quadrant pain and CT revealed a splenic infarct.  She was started on IV heparin and briefly required pressors for shock. 6/17 the patient grew out  6/19 amputation of the third digit on the left hand for gangrene 6/23 TEE was negative for endocarditis-calcified mass noted in right atrium of unclear etiology-CT revealed liver abscess and subcapsular splenic hematoma-Eliquis held 6/25 CT-guided drainage of liver abscess-drain removed on 7/3 7/5-left AKA for left first and second gangrene 7/7-drain placed into splenic abscess with 350 cc of pus- culture NTD  Subjective:  Moaning in pain but cannot explain to me where she hurts.  Does not communicate with me much and does not answer my questions.  Only asked for tech to be sent to her room to help readjust her in the bed.  Assessment and Plan: Principal Problem:   Splenic infarction -Heparin resumed on 7/8-follow for bleeding  Active Problems: Hepatic and splenic abscesses - 64ml output from left upper quadrant splenic drain yesterday per documentation  Bacteremia with Bacteroides thetaiotaomicron, septic shock - Receiving cefepime with dialysis and IV metronidazole per ID - ID recommended that CBC and right fistula be removed in setting of persistent leukocytosis - appreciate ID management    Chronic respiratory failure with hypoxia (HCC) -Continue 2 L of oxygen    ESRD (end stage renal disease) on dialysis TThSat(HCC) -Appreciate nephrology  management  Anemia of chronic disease, acute blood loss and acute illness - hgb 6.4- receiving 1 U PRBC    Uncontrolled type 2 diabetes mellitus with hypoglycemia, with long-term current use of insulin (HCC) -Last A1c was checked on 4/4 and was 5.7  Severe PAD with 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 - Has wound VAC with a plan to keep if for about 10 days - appreciate vascular surgery management   Status post left right third digit amputation on 6/19 by hand surgery for gangrene - Currently in a dressing which I have not opened - Have spoken with her surgeon, Dr Tempie Donning today and he plans to f/u with her soon  Pressure injury -   stage II on left buttock  Hypertension - Receiving amlodipine and carvedilol      DVT prophylaxis: Heparin drip per pharmacy consult   Code Status: Full Code  Consultants: Nephrology, hand surgery, vascular surgery, infectious disease Level of Care: Level of care: Med-Surg Disposition Plan:  Status is: Inpatient Remains inpatient appropriate because: Splenic drain, OT recommends long-term care facility-waiting for PT to evaluate her after AKA  Objective:   Vitals:   10/14/21 0542 10/14/21 0543 10/14/21 1014 10/14/21 1048  BP:  125/63 117/65 (!) (P) 113/56  Pulse:  80 (!) 59 (P) 79  Resp:  18 16 (P) 16  Temp:  99.4 F (37.4 C) 98.9 F (37.2 C) (P) 99.3 F (37.4 C)  TempSrc:   Oral   SpO2:  100% 99% (P) 100%  Weight: (!) 147.3 kg     Height:       [employer]  10/12/21 0727 10/12/21 1150 10/14/21 0542  Weight: (!) 152 kg (!) 145.9 kg (!) 147.3 kg   Exam: General exam: Appears uncomfortable, propped up in bed  HEENT: PERRLA, oral mucosa moist, no sclera icterus or thrush Respiratory system: Clear to auscultation. Respiratory effort normal. Cardiovascular system: S1 & S2 heard, regular rate and rhythm Gastrointestinal system: Abdomen soft, drain present in LUQ- tender, nondistended. Normal bowel sounds   Central nervous  system: Alert   Extremities: No cyanosis, clubbing - b/l AKA- wound vac to left AKA, left hand in dressing      Imaging and lab data was personally reviewed    CBC: Recent Labs  Lab 10/09/21 0050 10/09/21 1342 10/10/21 0330 10/10/21 1615 10/11/21 0503 10/11/21 2031 10/12/21 0315 10/12/21 1700 10/13/21 0504 10/14/21 0353  WBC 21.3*  --  31.0*  --  30.3*  --  20.7*  --   --  14.3*  HGB 7.3*   < > 6.6*   < > 6.7* 7.3* 7.4* 7.8* 7.5* 6.4*  HCT 25.6*   < > 22.9*   < > 23.1* 24.7* 25.6* 25.8* 25.1* 22.4*  MCV 99.6  --  98.3  --  98.3  --  97.7  --   --  99.6  PLT 307  --  310  --  242  --  217  --   --  158   < > = values in this interval not displayed.   Basic Metabolic Panel: Recent Labs  Lab 10/08/21 0506 10/09/21 0050 10/09/21 1342 10/10/21 0330 10/11/21 0503 10/12/21 0315  NA 131* 132* 131* 130* 134* 133*  K 4.0 3.6 3.7 4.9 4.0 4.0  CL 94* 94* 95* 95* 96* 96*  CO2 25 25  --  23 28 24   GLUCOSE 86 127* 88 193* 160* 120*  BUN 26* 18 20 25* 20 28*  CREATININE 6.14* 4.44* 5.50* 5.89* 4.33* 5.25*  CALCIUM 8.4* 8.3*  --  7.9* 7.8* 7.3*  PHOS 4.4 3.7  --  6.7* 4.6 5.1*   GFR: Estimated Creatinine Clearance: 18.6 mL/min (A) (by C-G formula based on SCr of 5.25 mg/dL (H)).  Scheduled Meds:  amLODipine  10 mg Oral Daily   atorvastatin  10 mg Oral QHS   calcitRIOL  1 mcg Oral Q T,Th,Sa-HD   carvedilol  3.125 mg Oral BID WC   cinacalcet  30 mg Oral Q T,Th,Sa-HD   darbepoetin (ARANESP) injection - DIALYSIS  200 mcg Intravenous Q Sat-HD   diclofenac Sodium  2 g Topical QID   docusate sodium  100 mg Oral Daily   feeding supplement  1 Container Oral TID BM   gabapentin  100 mg Oral Q12H   gabapentin  200 mg Oral Q T,Th,Sa-HD   Gerhardt's butt cream   Topical BID   insulin aspart  1-3 Units Subcutaneous TID WC   metoCLOPramide (REGLAN) injection  5 mg Intravenous Q6H   multivitamin  1 tablet Oral QHS   pantoprazole  40 mg Oral BID   polyethylene glycol  17 g Oral BID    sodium chloride flush  10-40 mL Intracatheter Q12H   sodium chloride flush  3 mL Intravenous Q12H   sodium chloride flush  5 mL Intracatheter Q8H   Continuous Infusions:  sodium chloride 10 mL/hr at 10/09/21 1410   ceFEPime (MAXIPIME) IV Stopped (10/12/21 1115)   magnesium sulfate bolus IVPB     metronidazole 500 mg (10/14/21 0458)     LOS: 34 days   Author: Eunice Blase  [place] Pulse  10/14/2021 12:24 PM

## 2021-10-14 NOTE — Progress Notes (Addendum)
ANTICOAGULATION CONSULT NOTE  Pharmacy Consult for heparin Indication:  splenic infarct  Height: 5 ft 5 inches Weight : 150.4 kg Heparin Dosing Weight: 95 kg  Labs: Recent Labs    10/12/21 0315 10/12/21 1700 10/13/21 0504 10/14/21 0353  HGB 7.4* 7.8* 7.5* 6.4*  HCT 25.6* 25.8* 25.1* 22.4*  PLT 217  --   --  158  HEPARINUNFRC  --  0.35 0.37 0.39  CREATININE 5.25*  --   --   --      Assessment: 61 yof with hx PVD s/p R AKA 5/10 for critical limb ischemia, ESRD on HD TTS, found to have splenic infarct and transitioned from heparin infusion to apixaban on 6/14. Pharmacy consulted to transition back to heparin infusion with patient hypotensive requiring transfer to ICU s/p TEE on 6/23. Last dose of apixaban 6/23 at 1048. She is s/p IR drain placement for hepatic abscess and 6/23 noted CT finding of possible subcapsular hematoma so heparin goal was lowered.  Pharmacy consult for restarting heparin. Restarted on 7/8, no bolus.  Heparin now HELD.  Heparin level was therapeutic at 0.39, this morning. However, Hgb was 6.4, so heparin is now held and PRBC x 1 ordered.    Goal of Therapy:  Heparin level 0.3-0.5 units/ml Monitor platelets by anticoagulation protocol: Yes   PLAN:     Holding heparin infusion  (Was running at 1950 units/hr -reduced rate from previous dosing due to low Hgb s/p Left AKA.)  F/u restart heparin?    Thank you for allowing [place] to participate in this patients care. Jens Som, PharmD 10/14/2021 9:23 AM  **Pharmacist phone directory can be found on [email or web address] listed under [place]**

## 2021-10-14 NOTE — Progress Notes (Signed)
Occupational Therapy Treatment Patient Details Name: Courtney Gray MRN: [id number] DOB: 03-01-1971 Today's Date: 10/14/2021   History of present illness 51 y.o. female admitted 09/09/21 with severe LUQ abdominal pain, hypoglycemia. Pt with splenic infarct, shock, L 3rd finger gangrene, s/p amputation. 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   OT comments  Pt sleepy, closes eyes unless stimulated. Completed UE AROM exercises. Repositioned in bed for grooming and self feeding with set up. Pt receiving blood, 6.4 hemoglobin. C/O neck pain, massaged and provided warm pack. Updated d/c recommendation to [facility] as pt unable to tolerate chair for HD.   Recommendations for follow up therapy are one component of a multi-disciplinary discharge planning process, led by the attending physician.  Recommendations may be updated based on patient status, additional functional criteria and insurance authorization.    Follow Up Recommendations  OT at Long-term acute care hospital    Assistance Recommended at Discharge Frequent or constant Supervision/Assistance  Patient can return home with the following  Two people to help with walking and/or transfers;Two people to help with bathing/dressing/bathroom;Assistance with cooking/housework;Direct supervision/assist for medications management;Assist for transportation;Help with stairs or ramp for entrance   Equipment Recommendations  None recommended by OT (defer to next venue)    Recommendations for Other Services      Precautions / Restrictions Precautions Precautions: Fall Precaution Comments: B AKA, chronic 3L O2, abdominal drain Restrictions Weight Bearing Restrictions: Yes RLE Weight Bearing: Non weight bearing LLE Weight Bearing: Non weight bearing Other Position/Activity Restrictions: avoided weight through L hand       Mobility Bed  Mobility Overal bed mobility: Needs Assistance             General bed mobility comments: repositioned for eating with +2 total assist    Transfers                   General transfer comment: deferrred today, pt with hgb of 6.4, receiving blood transfusion     Balance                                           ADL either performed or assessed with clinical judgement   ADL Overall ADL's : Needs assistance/impaired Eating/Feeding: Minimal assistance;Bed level Eating/Feeding Details (indicate cue type and reason): assist to open containers Grooming: Wash/dry hands;Bed level;Set up;Wash/dry face                                      Extremity/Trunk Assessment              Vision       Perception     Praxis      Cognition Arousal/Alertness: Lethargic Behavior During Therapy: WFL for tasks assessed/performed Overall Cognitive Status: Within Functional Limits for tasks assessed                                 General Comments: needing constant stimulation to remain alert        Exercises Exercises: General Upper Extremity General Exercises - Upper Extremity Shoulder Flexion: AROM, Both, 10 reps, Supine Shoulder Horizontal ABduction: AROM, Both, 10 reps, Supine  Elbow Flexion: AROM, Both, 10 reps, Supine Elbow Extension: AROM, Supine, Both, 10 reps    Shoulder Instructions       General Comments      Pertinent Vitals/ Pain       Pain Assessment Pain Assessment: Faces Faces Pain Scale: Hurts even more Pain Location: neck Pain Descriptors / Indicators: Sore, Discomfort, Grimacing Pain Intervention(s): Heat applied (massage)  Home Living                                          Prior Functioning/Environment              Frequency           Progress Toward Goals  OT Goals(current goals can now be found in the care plan section)  Progress towards OT goals: Progressing  toward goals  Acute Rehab OT Goals OT Goal Formulation: With patient Time For Goal Achievement: 10/28/21 Potential to Achieve Goals: Fair ADL Goals Pt Will Perform Grooming: with set-up;sitting Pt/caregiver will Perform Home Exercise Program: Increased strength;Both right and left upper extremity;With theraband;Independently Additional ADL Goal #1: Pt will tolerate sitting EOB for 5+ mins with no support to complete ADL tasks. Additional ADL Goal #2: Pt will roll with min assist for pericare and pressure relief.  Plan Discharge plan needs to be updated    Co-evaluation                 AM-PAC OT "6 Clicks" Daily Activity     Outcome Measure   Help from another person eating meals?: A Little Help from another person taking care of personal grooming?: A Little Help from another person toileting, which includes using toliet, bedpan, or urinal?: Total Help from another person bathing (including washing, rinsing, drying)?: A Lot Help from another person to put on and taking off regular upper body clothing?: A Little Help from another person to put on and taking off regular lower body clothing?: Total 6 Click Score: 13    End of Session Equipment Utilized During Treatment: Oxygen (3L)  OT Visit Diagnosis: Muscle weakness (generalized) (M62.81);History of falling (Z91.81)   Activity Tolerance Patient limited by lethargy   Patient Left in bed;with call bell/phone within reach;with family/visitor present   Nurse Communication          Time: 1660-6301 OT Time Calculation (min): 15 min  Charges: OT General Charges $OT Visit: 1 Visit OT Treatments $Therapeutic Exercise: 8-22 mins  Cleta Alberts, OTR/L Acute Rehabilitation Services Office: [phone]   [name] 10/14/2021, 12:37 PM

## 2021-10-15 ENCOUNTER — Inpatient Hospital Stay (HOSPITAL_COMMUNITY): Payer: 59

## 2021-10-15 DIAGNOSIS — D735 Infarction of spleen: Secondary | ICD-10-CM | POA: Diagnosis not present

## 2021-10-15 HISTORY — PX: IR REMOVAL TUN CV CATH W/O FL: IMG2289

## 2021-10-15 LAB — TYPE AND SCREEN
ABO/RH(D): O POS
Antibody Screen: NEGATIVE
Unit division: 0

## 2021-10-15 LAB — CBC
HCT: 30.7 % — ABNORMAL LOW (ref 36.0–46.0)
Hemoglobin: 9.5 g/dL — ABNORMAL LOW (ref 12.0–15.0)
MCH: 29 pg (ref 26.0–34.0)
MCHC: 30.9 g/dL (ref 30.0–36.0)
MCV: 93.6 fL (ref 80.0–100.0)
Platelets: 199 10*3/uL (ref 150–400)
RBC: 3.28 MIL/uL — ABNORMAL LOW (ref 3.87–5.11)
RDW: 20.2 % — ABNORMAL HIGH (ref 11.5–15.5)
WBC: 15.8 10*3/uL — ABNORMAL HIGH (ref 4.0–10.5)
nRBC: 0.5 % — ABNORMAL HIGH (ref 0.0–0.2)

## 2021-10-15 LAB — BPAM RBC
Blood Product Expiration Date: 202308112359
ISSUE DATE / TIME: 202307101029
Unit Type and Rh: 5100

## 2021-10-15 LAB — RENAL FUNCTION PANEL
Albumin: 2 g/dL — ABNORMAL LOW (ref 3.5–5.0)
Anion gap: 12 (ref 5–15)
BUN: 32 mg/dL — ABNORMAL HIGH (ref 6–20)
CO2: 24 mmol/L (ref 22–32)
Calcium: 7.6 mg/dL — ABNORMAL LOW (ref 8.9–10.3)
Chloride: 94 mmol/L — ABNORMAL LOW (ref 98–111)
Creatinine, Ser: 6.31 mg/dL — ABNORMAL HIGH (ref 0.44–1.00)
GFR, Estimated: 7 mL/min — ABNORMAL LOW (ref >60)
Glucose, Bld: 91 mg/dL (ref 70–99)
Phosphorus: 5.7 mg/dL — ABNORMAL HIGH (ref 2.5–4.6)
Potassium: 3.8 mmol/L (ref 3.5–5.1)
Sodium: 130 mmol/L — ABNORMAL LOW (ref 135–145)

## 2021-10-15 LAB — HEPARIN LEVEL (UNFRACTIONATED): Heparin Unfractionated: 0.32 IU/mL (ref 0.30–0.70)

## 2021-10-15 LAB — GLUCOSE, CAPILLARY
Glucose-Capillary: 98 mg/dL (ref 70–99)
Glucose-Capillary: 136 mg/dL — ABNORMAL HIGH (ref 70–99)
Glucose-Capillary: 98 mg/dL (ref 70–99)

## 2021-10-15 MED ORDER — HEPARIN SODIUM (PORCINE) 5000 UNIT/ML IJ SOLN
5000.0000 [IU] | Freq: Three times a day (TID) | INTRAMUSCULAR | Status: DC
  Administered 2021-10-15 – 2021-10-21 (×18): 5000 [IU] via SUBCUTANEOUS
  Filled 2021-10-15 (×17): qty 1

## 2021-10-15 MED ORDER — LIDOCAINE HCL 1 % IJ SOLN
20.0000 mL | Freq: Once | INTRAMUSCULAR | Status: AC
Start: 2021-10-15 — End: 2021-10-15
  Filled 2021-10-15: qty 20

## 2021-10-15 MED ORDER — HYDROMORPHONE HCL 1 MG/ML IJ SOLN: 1.0000 mg | INTRAMUSCULAR | Status: DC | PRN

## 2021-10-15 MED ORDER — LIDOCAINE HCL (PF) 1 % IJ SOLN
INTRAMUSCULAR | Status: AC
  Administered 2021-10-15: 20 mL
  Filled 2021-10-15: qty 30

## 2021-10-15 NOTE — Progress Notes (Addendum)
[facility] Progress Note   Subjective:    Seen and examined on HD. No acute complaints. S/p 1 unit PRBCs yesterday for Hgb 6.5. Hgb now 9.5.   Objective Vitals:   10/15/21 0906 10/15/21 0936 10/15/21 1000 10/15/21 1030  BP: (!) 142/72 (!) 143/90 (!) 149/74 (!) 165/74  Pulse: 74 72 77 78  Resp: 11 12 16 13   Temp: 97.9 F (36.6 C)     TempSrc: Oral   Oral  SpO2:  98% 99% 90%  Weight:      Height:       Physical Exam General: Ill-appearing; obese; on 4L [place] Heart: S1 and S2; No murmurs, gallops, or rubs Lungs: Clear uppers anteriorly and posteriorly Abdomen: Obese, soft Extremities: R AKA; L AKA-wound vac in place; L hand with ace wrap in place Dialysis Access: R [facility]    Filed Weights   10/14/21 0542 10/15/21 0500 10/15/21 0851  Weight: (!) 147.3 kg (!) 149.5 kg (!) 147.9 kg    Intake/Output Summary (Last 24 hours) at 10/15/2021 1055 Last data filed at 10/15/2021 0700 Gross per 24 hour  Intake 1704.38 ml  Output 50 ml  Net 1654.38 ml    Additional Objective Labs: Basic Metabolic Panel: Recent Labs  Lab 10/11/21 0503 10/12/21 0315 10/15/21 0421  NA 134* 133* 130*  K 4.0 4.0 3.8  CL 96* 96* 94*  CO2 28 24 24   GLUCOSE 160* 120* 91  BUN 20 28* 32*  CREATININE 4.33* 5.25* 6.31*  CALCIUM 7.8* 7.3* 7.6*  PHOS 4.6 5.1* 5.7*   Liver Function Tests: Recent Labs  Lab 10/11/21 0503 10/12/21 0315 10/15/21 0421  ALBUMIN 1.9* 2.0* 2.0*   No results for input(s): "LIPASE", "AMYLASE" in the last 168 hours. CBC: Recent Labs  Lab 10/10/21 0330 10/10/21 1615 10/11/21 0503 10/11/21 2031 10/12/21 0315 10/12/21 1700 10/14/21 0353 10/14/21 1540 10/15/21 0421  WBC 31.0*  --  30.3*  --  20.7*  --  14.3*  --  15.8*  HGB 6.6*   < > 6.7*   < > 7.4*   < > 6.4* 9.0* 9.5*  HCT 22.9*   < > 23.1*   < > 25.6*   < > 22.4* 29.5* 30.7*  MCV 98.3  --  98.3  --  97.7  --  99.6  --  93.6  PLT 310  --  242  --  217  --  158  --  199   < > = values in this interval  not displayed.   Blood Culture    Component Value Date/Time   SDES ABSCESS 10/11/2021 1700   SPECREQUEST SPLEEN 10/11/2021 1700   CULT  10/11/2021 1700    RARE STAPHYLOCOCCUS EPIDERMIDIS NO ANAEROBES ISOLATED; CULTURE IN PROGRESS FOR 5 DAYS    REPTSTATUS PENDING 10/11/2021 1700    Cardiac Enzymes: No results for input(s): "CKTOTAL", "CKMB", "CKMBINDEX", "TROPONINI" in the last 168 hours. CBG: Recent Labs  Lab 10/13/21 1646 10/14/21 0724 10/14/21 1130 10/14/21 1551 10/15/21 0726  GLUCAP 147* 83 80 134* 98   Iron Studies: No results for input(s): "IRON", "TIBC", "TRANSFERRIN", "FERRITIN" in the last 72 hours. Lab Results  Component Value Date   INR 1.3 (H) 10/09/2021   INR 1.3 (H) 09/10/2021   INR 1.3 (H) 07/10/2021   Studies/Results: No results found.  Medications:  sodium chloride 10 mL/hr at 10/09/21 1410   ceFEPime (MAXIPIME) IV Stopped (10/12/21 1115)   heparin 1,950 Units/hr (10/15/21 0700)   magnesium sulfate bolus IVPB  metronidazole Stopped (10/15/21 5701)    amLODipine  10 mg Oral Daily   atorvastatin  10 mg Oral QHS   calcitRIOL  1 mcg Oral Q T,Th,Sa-HD   carvedilol  3.125 mg Oral BID WC   cinacalcet  30 mg Oral Q T,Th,Sa-HD   darbepoetin (ARANESP) injection - DIALYSIS  200 mcg Intravenous Q Sat-HD   docusate sodium  100 mg Oral Daily   feeding supplement  1 Container Oral TID BM   gabapentin  100 mg Oral Q12H   gabapentin  200 mg Oral Q T,Th,Sa-HD   Gerhardt's butt cream   Topical BID   insulin aspart  1-3 Units Subcutaneous TID WC   metoCLOPramide (REGLAN) injection  5 mg Intravenous Q6H   multivitamin  1 tablet Oral QHS   pantoprazole  40 mg Oral BID   polyethylene glycol  17 g Oral BID   sodium chloride flush  10-40 mL Intracatheter Q12H   sodium chloride flush  3 mL Intravenous Q12H   sodium chloride flush  5 mL Intracatheter Q8H    Dialysis Orders: [name] TTS 4h 74min, BFR 400, EDW 151 kg, 1K/2.5Ca bath, TDC Hep 2,600 load and  1,500 hourly-Total dose 9275 units per treatment.  - mircera 200ug q2 due 09/10/21 - calcitriol 1 ug tiw - sensipar 30 mg tiw  Assessment/Plan: Septic shock: On admit requiring short-term pressors and again after TEE. Found to have bacteremia with bacteroides thetaiotaomicron and liver abscess and concern for splenic abscess. Appreciate ID -currently recommends removal of TDC. I spoke with patient on HD today and she is now agreeable to proceed with TDC removal with subsequent placement in 48hrs. Order placed for IR to evaluate. Remains on course of Cefepime with HD and oral metronidazole.  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 Splenic/Hepatic abscesses: S/p drain placed in IR 09/29/2021.  ESRD: Continue HD per usual TTS schedule - on HD. Pt has requested 4 hour treatments instead of 4 hr 61min. Advised we can try 4 hour treatments as long as labs are acceptable and we are removing adequate amounts of fluid. No heparin with HD, on heparin drip for now. Chronic hypoxic resp failure - At baseline, she is on 3L O2 at home. HTN/volume:  Optimize UF with HD. She has now reached her old EDW but need to lower further. CXR repeated still shows vascular congestion.  Anemia of ESRD: S/p 5 units PRBCs during this hospitalization. S/p another unit PRBC yesterday for Hgb 6.4. Hgb now 9.5. Continue Aranesp 200 mcg q Saturday. Continue to transfuse for Hgb after 7.     Secondary Hyperparathyroidism: CorrCa/Phos ok, requesting to hold binder due to constipation but resume if phos trends back up. Continue VDRA + sensipar.   T2DM- per primary team Malnutrition in setting of Morbid Obesity - Albumin very low. Continue protein supplements.   Tobie Poet, NP [facility] 10/15/2021,10:55 AM  LOS: 35 days

## 2021-10-15 NOTE — Progress Notes (Signed)
Pt arrived to hemodialysis unit with heparin drip infusing at 1950 units per hour via left IJ blue port

## 2021-10-15 NOTE — Progress Notes (Signed)
[facility] Progress Note  Patient: Courtney Gray     XAJ:[id number]  DOA: 09/09/2021   PCP: Monico Blitz, MD       Brief hospital course: This is a 51 year old female with end-stage renal disease, chronic HFpEF with grade 2 diastolic dysfunction, insulin requiring diabetes mellitus, morbid obesity chronically on 2 L of oxygen, peripheral vascular disease status post right AKA on 5/10 for critical limb ischemia. The patient presented to the ED on 09/09/2021 with severe left upper quadrant pain and CT revealed a splenic infarct.  She was started on IV heparin and briefly required pressors for shock. 6/17 the patient grew out Bacteroides Thetaiotaomicron in anaerobic bottle of one set 6/19 amputation of the third digit on the left hand for gangrene 6/23 TEE was negative for endocarditis-calcified mass noted in right atrium of unclear etiology-CT revealed liver abscess and subcapsular splenic hematoma-Eliquis held 6/25 CT-guided drainage of liver abscess-culture negative- drain removed on 7/3 7/5-left AKA for left first and second gangrene 7/7-drain placed into splenic abscess with 350 cc of pus- culture NGTD  Subjective:   Has no complaints, interestingly. When asked, admits to pain in left knee/stump  Assessment and Plan: Principal Problem:   Splenic infarction - ? Due to Septic embolus - I will stop Heparin as her splenic infarct was likely a septic embolus and not a blood clot- He has b/l AKA but still could get a femoral or pelvic DVT and therefore will place on chemical dvt prophylaxis  Active Problems: Hepatic and splenic abscesses - 67ml output from left upper quadrant splenic drain yesterday per documentation  Bacteremia with Bacteroides thetaiotaomicron, septic shock - Receiving cefepime with dialysis and IV metronidazole per ID - ID recommended that CVC and right fistula be removed in setting of persistent leukocytosis- orders placed - appreciate ID  management  Anemia of chronic disease, acute blood loss and acute illness - hgb 6.4- received 1 U PRBC on 7/10- Hgb ~ 9 now    Chronic respiratory failure with hypoxia (HCC) -Continue 2 L of oxygen    ESRD (end stage renal disease) on dialysis TThSat(HCC) -Appreciate nephrology management  Uncontrolled type 2 diabetes mellitus with hypoglycemia, with long-term current use of insulin (HCC) -Last A1c was checked on 4/4 and was 5.7 - she is on 1-3 u of Novolog with mels per SS- patient ha been refusing it and sugars are reasonable- will dc  Severe PAD with prior left right AKA Left AKA for gangrene 7/5 - Has wound VAC with a plan to keep if for about 10 days (end on 7/15) - appreciate vascular surgery management   Status post left right third digit amputation on 6/19 by hand surgery for gangrene -Dr Tempie Donning removed dressing on 7/10  Pressure injury -   stage II on left buttock  Hypertension - amlodipine and carvedilol ordered but patient is declining it  Obesity BMI not accurate due to b/l AKA    DVT prophylaxis: Heparin drip per pharmacy consult   Code Status: Full Code  Consultants: Nephrology, hand surgery, vascular surgery, infectious disease Level of Care: Level of care: Med-Surg Disposition Plan:  Status is: Inpatient Remains inpatient appropriate because:  OT recommends long-term care facility-waiting for PT to evaluate her after AKA- PT will see her today- needs HD cath and CVC removed  Objective:   Vitals:   10/15/21 1100 10/15/21 1130 10/15/21 1206 10/15/21 1254  BP: (!) 159/133 (!) 145/72 (!) 142/63   Pulse: 74 75    Resp: 11  11 12   Temp:   97.8 F (36.6 C)   TempSrc:   Oral   SpO2: 97% 99% 96%   Weight:    (!) 144.8 kg  Height:       Filed Weights   10/15/21 0500 10/15/21 0851 10/15/21 1254  Weight: (!) 149.5 kg (!) 147.9 kg (!) 144.8 kg   Exam: General exam: Appears comfortable  HEENT: PERRLA, oral mucosa moist, no sclera icterus or  thrush Respiratory system: Clear to auscultation. Respiratory effort normal. Cardiovascular system: S1 & S2 heard, regular rate and rhythm Gastrointestinal system: Abdomen soft, non-tender, nondistended. Normal bowel sounds   Central nervous system: Alert and oriented. No focal neurological deficits. Extremities: No cyanosis, clubbing or edema- b/l AKA- drain in left stump- left finger amputations noted Psychiatry:  flat affect    Imaging and lab data was personally reviewed    CBC: Recent Labs  Lab 10/10/21 0330 10/10/21 1615 10/11/21 0503 10/11/21 2031 10/12/21 0315 10/12/21 1700 10/13/21 0504 10/14/21 0353 10/14/21 1540 10/15/21 0421  WBC 31.0*  --  30.3*  --  20.7*  --   --  14.3*  --  15.8*  HGB 6.6*   < > 6.7*   < > 7.4* 7.8* 7.5* 6.4* 9.0* 9.5*  HCT 22.9*   < > 23.1*   < > 25.6* 25.8* 25.1* 22.4* 29.5* 30.7*  MCV 98.3  --  98.3  --  97.7  --   --  99.6  --  93.6  PLT 310  --  242  --  217  --   --  158  --  199   < > = values in this interval not displayed.    Basic Metabolic Panel: Recent Labs  Lab 10/09/21 0050 10/09/21 1342 10/10/21 0330 10/11/21 0503 10/12/21 0315 10/15/21 0421  NA 132* 131* 130* 134* 133* 130*  K 3.6 3.7 4.9 4.0 4.0 3.8  CL 94* 95* 95* 96* 96* 94*  CO2 25  --  23 28 24 24   GLUCOSE 127* 88 193* 160* 120* 91  BUN 18 20 25* 20 28* 32*  CREATININE 4.44* 5.50* 5.89* 4.33* 5.25* 6.31*  CALCIUM 8.3*  --  7.9* 7.8* 7.3* 7.6*  PHOS 3.7  --  6.7* 4.6 5.1* 5.7*    GFR: Estimated Creatinine Clearance: 15.3 mL/min (A) (by C-G formula based on SCr of 6.31 mg/dL (H)).  Scheduled Meds:  amLODipine  10 mg Oral Daily   atorvastatin  10 mg Oral QHS   calcitRIOL  1 mcg Oral Q T,Th,Sa-HD   carvedilol  3.125 mg Oral BID WC   cinacalcet  30 mg Oral Q T,Th,Sa-HD   darbepoetin (ARANESP) injection - DIALYSIS  200 mcg Intravenous Q Sat-HD   docusate sodium  100 mg Oral Daily   feeding supplement  1 Container Oral TID BM   gabapentin  100 mg Oral Q12H    gabapentin  200 mg Oral Q T,Th,Sa-HD   Gerhardt's butt cream   Topical BID   insulin aspart  1-3 Units Subcutaneous TID WC   metoCLOPramide (REGLAN) injection  5 mg Intravenous Q6H   multivitamin  1 tablet Oral QHS   pantoprazole  40 mg Oral BID   polyethylene glycol  17 g Oral BID   sodium chloride flush  10-40 mL Intracatheter Q12H   sodium chloride flush  3 mL Intravenous Q12H   sodium chloride flush  5 mL Intracatheter Q8H   Continuous Infusions:  sodium chloride 10 mL/hr at 10/09/21  1410   ceFEPime (MAXIPIME) IV Stopped (10/12/21 1115)   heparin 1,950 Units/hr (10/15/21 0700)   magnesium sulfate bolus IVPB     metronidazole Stopped (10/15/21 0643)     LOS: 35 days   Author: Debbe Odea  10/15/2021 2:41 PM

## 2021-10-15 NOTE — Procedures (Signed)
Interventional Radiology Procedure Note  PROCEDURE SUMMARY:  Successful removal of tunneled dialysis catheter.  No complications.   EBL = trace  Please see full dictation in imaging section of Epic for procedure details.   Narda Rutherford, AGNP-BC 10/15/2021, 5:32 PM

## 2021-10-15 NOTE — TOC Progression Note (Signed)
Transition of Care [facility]) - Initial/Assessment Note    Patient Details  Name: Courtney Gray MRN: [id number] Date of Birth: [birth year]  Transition of Care [facility]) CM/SW Contact:    Milinda Antis, LCSWA Phone Number: 10/15/2021, 2:45 PM  Clinical Narrative:                 CSW received a message from [place] with [facility].  The facility is able to accept the patient LT when medically ready.  The dialysis clinic will need to be switched to one in [place].  Renal Navigator informed.    Expected Discharge Plan: Skilled Nursing Facility Barriers to Discharge: Continued Medical Work up   Patient Goals and CMS Choice Patient states their goals for this hospitalization and ongoing recovery are:: SNF CMS Medicare.gov Compare Post Acute Care list provided to:: Patient Choice offered to / list presented to : Patient, Adult Children (Daughter, Antionette)  Expected Discharge Plan and Services Expected Discharge Plan: Skilled Nursing Facility In-house Referral: Clinical Social Work Discharge Planning Services: CM Consult Post Acute Care Choice: Durable Medical Equipment Living arrangements for the past 2 months: Single Family Home                                      Prior Living Arrangements/Services Living arrangements for the past 2 months: Single Family Home Lives with:: Adult Children Patient language and need for interpreter reviewed:: Yes Do you feel safe going back to the place where you live?: No   SNF  Need for Family Participation in Patient Care: Yes (Comment) Care giver support system in place?: Yes (comment) Current home services: DME (Cane, w/c, shower sest, rollator, home O2) Criminal Activity/Legal Involvement Pertinent to Current Situation/Hospitalization: No - Comment as needed  Activities of Daily Living Home Assistive Devices/Equipment: Walker (specify type) ADL Screening (condition at time of admission) Patient's cognitive ability adequate to  safely complete daily activities?: No Is the patient deaf or have difficulty hearing?: No Does the patient have difficulty seeing, even when wearing glasses/contacts?: No Does the patient have difficulty concentrating, remembering, or making decisions?: No Patient able to express need for assistance with ADLs?: Yes Does the patient have difficulty dressing or bathing?: Yes Independently performs ADLs?: No Communication: Independent Dressing (OT): Dependent Is this a change from baseline?: Pre-admission baseline Grooming: Needs assistance Is this a change from baseline?: Pre-admission baseline Feeding: Independent Bathing: Dependent Is this a change from baseline?: Pre-admission baseline Toileting: Dependent Is this a change from baseline?: Pre-admission baseline In/Out Bed: Dependent Is this a change from baseline?: Pre-admission baseline Walks in Home: Dependent Is this a change from baseline?: Change from baseline, expected to last >3 days Does the patient have difficulty walking or climbing stairs?: Yes Weakness of Legs: Both Weakness of Arms/Hands: Both  Permission Sought/Granted Permission sought to share information with : Case Manager, Family Supports, Chartered certified accountant granted to share information with : Yes, Verbal Permission Granted  Share Information with NAME: Antoinette  Permission granted to share info w AGENCY: SNF  Permission granted to share info w Relationship: daughter  Permission granted to share info w Contact Information: Lorenso Courier [phone]  Emotional Assessment Appearance:: Appears stated age Attitude/Demeanor/Rapport: Gracious Affect (typically observed): Calm Orientation: : Oriented to Self, Oriented to Place, Oriented to  Time, Oriented to Situation (WDL) Alcohol / Substance Use: Not Applicable Psych Involvement: No (comment)  Admission diagnosis:  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   Lactic acidosis 09/10/2021   Mixed diabetic hyperlipidemia associated with type 2 diabetes mellitus ([place]) 09/10/2021   Coronary artery disease involving native coronary artery of native heart without angina pectoris 09/10/2021   Elevated troponin level not due myocardial infarction 09/10/2021   Hypotension 09/10/2021   Dry gangrene ([place])    Pain 08/03/2021   Ischemic foot pain at rest 08/03/2021   Hypoglycemia 07/10/2021   Volume overload 07/09/2021   Acute pulmonary edema (HCC)    Leukocytosis 06/11/2021   Acute gout 06/11/2021   Hyperkalemia 05/21/2021   Hemorrhoids    Hyperlipidemia 05/18/2021   Rectal bleeding 05/17/2021   Morbid obesity with BMI of 60.0-69.9, adult ([place]) 05/17/2021   NSTEMI (non-ST elevated myocardial infarction) ([place]) 05/16/2021   Essential hypertension 09/01/2020   ESRD (end stage renal disease) on dialysis ([place]) 09/01/2020   Chronic respiratory failure with hypoxia ([place]) 09/01/2020   Hypertensive urgency 11/28/2019   Uncontrolled type 2 diabetes mellitus with hypoglycemia, with long-term current use of insulin ([place]) 11/28/2019   Anemia of chronic disease 11/28/2019   PCP:  Monico Blitz, MD Pharmacy:   [facility] [place] [address] [place] [address] Phone: [phone] Fax: [phone]  [facility] [address] [address] Phone: [phone] Fax: [phone]     Social Determinants of Health (SDOH) Interventions     Readmission Risk Interventions    09/16/2021    4:36 PM 08/28/2021   11:50 AM 07/12/2021   12:38 PM  Readmission Risk Prevention Plan  Transportation Screening Complete Complete Complete  Medication Review (RN Care Manager)  Complete Complete  PCP or Specialist appointment within 3-5 days of discharge  Complete Complete  HRI or Home Care Consult Complete Complete Complete  SW Recovery Care/Counseling Consult Complete Complete Complete  Palliative Care Screening  Not Applicable Not Applicable  Skilled Nursing Facility Complete Not Applicable Complete

## 2021-10-15 NOTE — Progress Notes (Signed)
ANTICOAGULATION CONSULT NOTE  Pharmacy Consult for heparin Indication:  splenic infarct  Height: 5 ft 5 inches Weight : 150.4 kg Heparin Dosing Weight: 95 kg  Labs: Recent Labs    10/13/21 0504 10/14/21 0353 10/14/21 1540 10/15/21 0421  HGB 7.5* 6.4* 9.0* 9.5*  HCT 25.1* 22.4* 29.5* 30.7*  PLT  --  158  --  199  HEPARINUNFRC 0.37 0.39  --  0.32  CREATININE  --   --   --  6.31*     Assessment: 50 yof with hx PVD s/p R AKA 5/10 for critical limb ischemia, ESRD on HD TTS, found to have splenic infarct and transitioned from heparin infusion to apixaban on 6/14. Pharmacy consulted to transition back to heparin infusion with patient hypotensive requiring transfer to ICU s/p TEE on 6/23. Last dose of apixaban 6/23 at 1048. She is s/p IR drain placement for hepatic abscess and 6/23 noted CT finding of possible subcapsular hematoma so heparin goal was lowered.  S/p PRBC Hgb up to 9, Heparin resumed evening of 7/10.  Heparin level is therapeutic this morning at 0.32. Hgb stable from 7/10 afternoon at 9.5.   Goal of Therapy:  Heparin level 0.3-0.5 units/ml Monitor platelets by anticoagulation protocol: Yes   PLAN:     Continue heparin at 1950 units/hr Check anti-Xa level daily while on heparin Continue to monitor H&H and platelets    Thank you for allowing [place] to participate in this patients care. Jens Som, PharmD 10/15/2021 8:06 AM  **Pharmacist phone directory can be found on [email or web address] listed under [place]**

## 2021-10-15 NOTE — Progress Notes (Signed)
Left TL catheter removed per order.  Manual pressure held with vaseline gauze and folded 4x4 for 5 minutes.  No s/s of bleeding post removal.  Pressure dressing remains.

## 2021-10-15 NOTE — Progress Notes (Signed)
Advised by CSW that pt has been accepted by snf in [place]. Referral submitted to [facility] admissions for review. Will follow and assist with out-pt HD needs.   Melven Sartorius Renal Navigator [phone]

## 2021-10-16 DIAGNOSIS — D735 Infarction of spleen: Secondary | ICD-10-CM | POA: Diagnosis not present

## 2021-10-16 LAB — AEROBIC/ANAEROBIC CULTURE W GRAM STAIN (SURGICAL/DEEP WOUND)

## 2021-10-16 LAB — GLUCOSE, CAPILLARY
Glucose-Capillary: 87 mg/dL (ref 70–99)
Glucose-Capillary: 92 mg/dL (ref 70–99)
Glucose-Capillary: 166 mg/dL — ABNORMAL HIGH (ref 70–99)
Glucose-Capillary: 119 mg/dL — ABNORMAL HIGH (ref 70–99)

## 2021-10-16 MED ORDER — LACTULOSE 10 GM/15ML PO SOLN
10.0000 g | Freq: Every day | ORAL | Status: DC | PRN
  Administered 2021-10-16: 10 g via ORAL
  Filled 2021-10-16: qty 15

## 2021-10-16 MED ORDER — METRONIDAZOLE 500 MG PO TABS
500.0000 mg | ORAL_TABLET | Freq: Two times a day (BID) | ORAL | Status: DC
  Administered 2021-10-16 – 2021-10-21 (×10): 500 mg via ORAL
  Filled 2021-10-16 (×10): qty 1

## 2021-10-16 NOTE — Progress Notes (Signed)
Physical Therapy Treatment Patient Details Name: Courtney Gray MRN: [id number] DOB: [birth year] Today's Date: 10/16/2021   History of Present Illness 51 y.o. female admitted 09/09/21 with severe LUQ abdominal pain, hypoglycemia. 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    PT Comments    Motivated with excellent participation. Painful initially at Lt residual limb incision however seemed to reduce with continued therapy today. Tolerated prolonged period of long sitting, working on reaching towards limits of stability and simulating posterior scoot techniques with use of rails. Patient will continue to benefit from skilled physical therapy services to further improve independence with functional mobility.   Recommendations for follow up therapy are one component of a multi-disciplinary discharge planning process, led by the attending physician.  Recommendations may be updated based on patient status, additional functional criteria and insurance authorization.  Follow Up Recommendations  Long-term institutional care without follow-up therapy     Assistance Recommended at Discharge Frequent or constant Supervision/Assistance  Patient can return home with the following Two people to help with bathing/dressing/bathroom;Assist for transportation;Help with stairs or ramp for entrance   Equipment Recommendations   (TBD next venue of care)    Recommendations for Other Services       Precautions / Restrictions Precautions Precautions: Fall Precaution Comments: B AKA, chronic 3L O2, abdominal drain, L LE wound vac, LUQ drain Restrictions Weight Bearing Restrictions: Yes RLE Weight Bearing: Non weight bearing LLE Weight Bearing: Non weight bearing Other Position/Activity Restrictions: minimized pushing/pulling  and weight through L hand, dressing now removed     Mobility  Bed Mobility Overal bed mobility: Needs Assistance Bed Mobility: Supine to Sit, Sit to Supine, Rolling Rolling: Mod assist   Supine to sit: +2 for physical assistance, Min assist Sit to supine: Supervision   General bed mobility comments: worked on rolling for pericare and pressure relief, pt with hand held assist vs using rail. Mod assist for rolling. Min assist +2 to rise to long sit in bed, pulling through UEs and using therapists hands for support, progressed to +1 to rise. No assist to return to supine, good control with descent.    Transfers                   General transfer comment: pt declined transfer to chair, able to simulated posterior scoot at bed level    Ambulation/Gait                   Stairs             Wheelchair Mobility    Modified Rankin (Stroke Patients Only)       Balance Overall balance assessment: Needs assistance   Sitting balance-[name] Scale: Good Sitting balance - Comments: pt tolerated long sitting in bed with wide range reaching. Practiced weight shifting and posterior scoot using arm rails for support.                                    Cognition Arousal/Alertness: Awake/alert Behavior During Therapy: WFL for tasks assessed/performed Overall Cognitive Status: Within Functional Limits for tasks assessed  General Comments: pt with eyes closed intermittently, but awake        Exercises Other Exercises Other Exercises: Performed supine hip flexion and isometric hip extension x10 and again when in sidelying for some abduction engagement of hips Other Exercises: Seated Long sit glute squeeze x10 Other Exercises: Lateral weight shift left and right multiple times with cues to facilitate scoot.    General Comments        Pertinent Vitals/Pain Pain Assessment Pain Assessment: Faces Faces  Pain Scale: Hurts even more Pain Location: L LE Pain Descriptors / Indicators: Moaning, Grimacing, Guarding Pain Intervention(s): Monitored during session, Repositioned    Home Living                          Prior Function            PT Goals (current goals can now be found in the care plan section) Acute Rehab PT Goals Patient Stated Goal: d/c to LTC/ALF PT Goal Formulation: With patient Time For Goal Achievement: 10/26/21 Potential to Achieve Goals: Fair Progress towards PT goals: Progressing toward goals    Frequency    Min 2X/week      PT Plan Current plan remains appropriate    Co-evaluation   Reason for Co-Treatment: Complexity of the patient's impairments (multi-system involvement);For patient/therapist safety;To address functional/ADL transfers PT goals addressed during session: Mobility/safety with mobility;Balance;Strengthening/ROM OT goals addressed during session: Strengthening/ROM      AM-PAC PT "6 Clicks" Mobility   Outcome Measure  Help needed turning from your back to your side while in a flat bed without using bedrails?: A Lot Help needed moving from lying on your back to sitting on the side of a flat bed without using bedrails?: A Lot Help needed moving to and from a bed to a chair (including a wheelchair)?: Total Help needed standing up from a chair using your arms (e.g., wheelchair or bedside chair)?: Total Help needed to walk in hospital room?: Total Help needed climbing 3-5 steps with a railing? : Total 6 Click Score: 8    End of Session Equipment Utilized During Treatment: Oxygen Activity Tolerance: Patient limited by lethargy Patient left: in bed;with call bell/phone within reach   PT Visit Diagnosis: Other abnormalities of gait and mobility (R26.89);Muscle weakness (generalized) (M62.81) Pain - Right/Left: Left Pain - part of body:  (Residual limb)     Time: 9379-0240 PT Time Calculation (min) (ACUTE ONLY): 44  min  Charges:  $Therapeutic Exercise: 8-22 mins $Neuromuscular Re-education: 8-22 mins                     Candie Mile, PT    Ellouise Newer 10/16/2021, 2:44 PM

## 2021-10-16 NOTE — Progress Notes (Signed)
Occupational Therapy Treatment Patient Details Name: Courtney Gray MRN: [id number] DOB: Nov 05, 1970 Today's Date: 10/16/2021   History of present illness 51 y.o. female admitted 09/09/21 with severe LUQ abdominal pain, hypoglycemia. Pt with splenic infarct, shock, L 3rd finger gangrene, s/p amputation. 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   OT comments  Pt with L LE incision pain, but willing to work through it. Focus of session on bed mobility, sitting balance at bed level, UE strengthening and simulating posterior transfer at bed level. Pt making gradual gains. Motivated to be able to transfer and propel a motorized w/c.    Recommendations for follow up therapy are one component of a multi-disciplinary discharge planning process, led by the attending physician.  Recommendations may be updated based on patient status, additional functional criteria and insurance authorization.    Follow Up Recommendations  Skilled nursing-short term rehab (<3 hours/day)    Assistance Recommended at Discharge Frequent or constant Supervision/Assistance  Patient can return home with the following  Two people to help with walking and/or transfers;Two people to help with bathing/dressing/bathroom;Assistance with cooking/housework;Direct supervision/assist for medications management;Assist for transportation;Help with stairs or ramp for entrance   Equipment Recommendations  None recommended by OT    Recommendations for Other Services      Precautions / Restrictions Precautions Precautions: Fall Precaution Comments: B AKA, chronic 3L O2, abdominal drain, L LE wound vac Restrictions Weight Bearing Restrictions: Yes RLE Weight Bearing: Non weight bearing LLE Weight Bearing: Non weight bearing Other Position/Activity Restrictions: minimized pushing/pulling and weight through L hand, dressing now  removed       Mobility Bed Mobility Overal bed mobility: Needs Assistance Bed Mobility: Supine to Sit, Sit to Supine, Rolling Rolling: Mod assist   Supine to sit: +2 for physical assistance, Min assist Sit to supine: Supervision   General bed mobility comments: worked on rolling for pericare and pressure relief, pt with hand held assist vs using rail    Transfers Overall transfer level: Needs assistance                 General transfer comment: pt declined transfer to chair, able to simulated posterior scoot at bed level     Balance Overall balance assessment: Needs assistance   Sitting balance-[name] Scale: Good Sitting balance - Comments: pt tolerated long sitting in bed with wide range reaching                                   ADL either performed or assessed with clinical judgement   ADL                                              Extremity/Trunk Assessment              Vision       Perception     Praxis      Cognition Arousal/Alertness: Awake/alert Behavior During Therapy: WFL for tasks assessed/performed Overall Cognitive Status: Within Functional Limits for tasks assessed                                 General Comments: pt  with eyes closed intermittently, but awake        Exercises General Exercises - Upper Extremity Shoulder Flexion: Strengthening, Right, 10 reps, Seated, Theraband Theraband Level (Shoulder Flexion): Level 2 (Red)    Shoulder Instructions       General Comments      Pertinent Vitals/ Pain       Pain Assessment Pain Assessment: Faces Faces Pain Scale: Hurts even more Pain Location: L LE Pain Descriptors / Indicators: Moaning, Grimacing, Guarding Pain Intervention(s): Monitored during session, Repositioned  Home Living                                          Prior Functioning/Environment              Frequency  Min 2X/week         Progress Toward Goals  OT Goals(current goals can now be found in the care plan section)  Progress towards OT goals: Progressing toward goals  Acute Rehab OT Goals OT Goal Formulation: With patient Time For Goal Achievement: 10/28/21 Potential to Achieve Goals: [place] Discharge plan needs to be updated    Co-evaluation    PT/OT/SLP Co-Evaluation/Treatment: Yes Reason for Co-Treatment: For patient/therapist safety;Complexity of the patient's impairments (multi-system involvement)   OT goals addressed during session: Strengthening/ROM      AM-PAC OT "6 Clicks" Daily Activity     Outcome Measure   Help from another person eating meals?: A Little Help from another person taking care of personal grooming?: A Little Help from another person toileting, which includes using toliet, bedpan, or urinal?: Total Help from another person bathing (including washing, rinsing, drying)?: A Lot Help from another person to put on and taking off regular upper body clothing?: A Little Help from another person to put on and taking off regular lower body clothing?: Total 6 Click Score: 13    End of Session Equipment Utilized During Treatment: Oxygen (3L)  OT Visit Diagnosis: Muscle weakness (generalized) (M62.81);History of falling (Z91.81)   Activity Tolerance Patient tolerated treatment well   Patient Left in bed;with call bell/phone within reach   Nurse Communication          Time: 4268-3419 OT Time Calculation (min): 42 min  Charges: OT General Charges $OT Visit: 1 Visit OT Treatments $Therapeutic Activity: 8-22 mins  Cleta Alberts, OTR/L Acute Rehabilitation Services Office: [phone]   [name] 10/16/2021, 2:32 PM

## 2021-10-16 NOTE — Progress Notes (Signed)
Nutrition Follow-up  DOCUMENTATION CODES:   Not applicable  INTERVENTION:  - Continue Boost Breeze po TID, each supplement provides 250 kcal and 9 grams of protein - Liberalized diet from renal/carb modified to a regular diet with 1200 ml fluid restriction to promote PO intake after surgery - Renal MVI daily  NUTRITION DIAGNOSIS:   Inadequate oral intake related to nausea as evidenced by per patient/family report.  Ongoing   GOAL:   Patient will meet greater than or equal to 90% of their needs  Progressing  MONITOR:   PO intake, Supplement acceptance, Labs, Weight trends, Skin, I & O's  REASON FOR ASSESSMENT:   Consult Enteral/tube feeding initiation and management (advance tube feeds to goal)  ASSESSMENT:   51 year old female who presented to the ED on 6/06 with abdominal pain, N/V, constipation. PMH of ESRD on HD, gangrene of RLE s/p recent R AKA, CHF, CAD, T2DM, chronic hypoxic respiratory failure on home oxygen, HLD, HTN, gout, severe PAD. Pt admitted with splenic infarction, PNA of LLL, shock.  5/10 - (prior to admission) Right AKA 06/06 - NG tube placed 06/08 - NG tube removed during CT scan, replaced by nursing 06/09 - NG tube removed, Cortrak placement attempted but unsuccessful due to pt refusal 6/19 - left middle finger amputation 6/23 - TEE 6/25 - CT guided drainage of hepatic abscess  7/5- s/p L AKA +wound VAC until 7/15 7/7- splenic abscess s/p drain placement in IR 7/11- line holiday, TDC removed; plans to replace Friday  Spoke with pt at bedside. Nephrology present during assessment.  Pt reports that she is trying to increase her intake as best as she can. She reports eating at least 50% of her meals, however feels full quickly and is unable to finish her meals. She also reports drinking 1 boost breeze daily.  Meal completions: 07/09: 85%-breakfast, 50%-lunch 07/10: 75%-dinner 07/11: 75%-lunch  Pt still requiring adequate nutrition to recover from  illness and support of wound healing. Her intake is noted to improve slightly within the last week. Will continue to monitor intake and recommend tube feeding if unable to meet increased nutrition needs via PO intake alone.   EDW 151 kg Admit wt: 150.6 kg Wt continuing to trend down throughout admission. Current post HD wt (7/11) noted to be 144.8 kg .  Medications: calcitriol TThS, cinacalcet TThS, colace, reglan, flagyl, rena-vit, protonix, miralax, IV abx  Labs: sodium 130, potassium 3.8 (wnl), BUN 32, Cr 6.31, ionized ca 0.97, phos (H), GFR 7, CBG's 87-166 x24 hours  UOP: 34ml   I/O's: +8284ml since 06/28  Diet Order:   Diet Order             Diet renal/carb modified with fluid restriction Diet-HS Snack? Nothing; Fluid restriction: 1200 mL Fluid; Room service appropriate? Yes; Fluid consistency: Thin  Diet effective now                   EDUCATION NEEDS:   Education needs have been addressed  Skin:  Skin Assessment: Skin Integrity Issues: Skin Integrity Issues:: Stage II, Incisions, Other (Comment) Stage II: bilateral buttocks Incisions: BLE s/p AKA Other: L middle finger amp 6/19  Last BM:  7/4  Height:   Ht Readings from Last 1 Encounters:  10/09/21 5\' 5"  (1.651 m)    Weight:   Wt Readings from Last 1 Encounters:  10/15/21 (!) 144.8 kg    Ideal Body Weight:  52.3 kg (adjusted for R AKA)  BMI:  Body mass index is  53.12 kg/m.  Estimated Nutritional Needs:   Kcal:  1800-2000  Protein:  90-110 grams  Fluid:  1000 ml + UOP  Clayborne Dana, RDN, LDN Clinical Nutrition

## 2021-10-16 NOTE — Progress Notes (Signed)
[facility] Progress Note   Subjective:    Seen and examined patient at bedside. S/p TDC removal yesterday by IR and L TL catheter removal yesterday by IV team for line holiday. She reports having post-nasal drip but otherwise is ok. Denies SOB, CP, and N/V.  Objective Vitals:   10/15/21 1254 10/15/21 2026 10/16/21 0556 10/16/21 0914  BP:  (!) 141/47 (!) 126/29 (!) 145/69  Pulse:  78 79 82  Resp:  20 20 17   Temp:  98.7 F (37.1 C) 98.6 F (37 C) 97.7 F (36.5 C)  TempSrc:    Oral  SpO2:   98% 100%  Weight: (!) 144.8 kg     Height:       Physical Exam General: Ill-appearing; obese; on 4L [place] Heart: S1 and S2; No murmurs, gallops, or rubs Lungs: Clear throughout; No wheezing, rales, or rhonchi Abdomen: Obese, soft Extremities: R AKA; L AKA-wound vac in place; L hand with ace wrap in place Dialysis Access: [facility] and L TL catheter removed 7/11 for line holiday  Filed Weights   10/15/21 0500 10/15/21 0851 10/15/21 1254  Weight: (!) 149.5 kg (!) 147.9 kg (!) 144.8 kg    Intake/Output Summary (Last 24 hours) at 10/16/2021 1200 Last data filed at 10/16/2021 0600 Gross per 24 hour  Intake 538.75 ml  Output --  Net 538.75 ml    Additional Objective Labs: Basic Metabolic Panel: Recent Labs  Lab 10/11/21 0503 10/12/21 0315 10/15/21 0421  NA 134* 133* 130*  K 4.0 4.0 3.8  CL 96* 96* 94*  CO2 28 24 24   GLUCOSE 160* 120* 91  BUN 20 28* 32*  CREATININE 4.33* 5.25* 6.31*  CALCIUM 7.8* 7.3* 7.6*  PHOS 4.6 5.1* 5.7*   Liver Function Tests: Recent Labs  Lab 10/11/21 0503 10/12/21 0315 10/15/21 0421  ALBUMIN 1.9* 2.0* 2.0*   No results for input(s): "LIPASE", "AMYLASE" in the last 168 hours. CBC: Recent Labs  Lab 10/10/21 0330 10/10/21 1615 10/11/21 0503 10/11/21 2031 10/12/21 0315 10/12/21 1700 10/14/21 0353 10/14/21 1540 10/15/21 0421  WBC 31.0*  --  30.3*  --  20.7*  --  14.3*  --  15.8*  HGB 6.6*   < > 6.7*   < > 7.4*   < > 6.4* 9.0* 9.5*   HCT 22.9*   < > 23.1*   < > 25.6*   < > 22.4* 29.5* 30.7*  MCV 98.3  --  98.3  --  97.7  --  99.6  --  93.6  PLT 310  --  242  --  217  --  158  --  199   < > = values in this interval not displayed.   Blood Culture    Component Value Date/Time   SDES ABSCESS 10/11/2021 1700   SPECREQUEST SPLEEN 10/11/2021 1700   CULT  10/11/2021 1700    RARE STAPHYLOCOCCUS EPIDERMIDIS NO ANAEROBES ISOLATED; CULTURE IN PROGRESS FOR 5 DAYS    REPTSTATUS PENDING 10/11/2021 1700    Cardiac Enzymes: No results for input(s): "CKTOTAL", "CKMB", "CKMBINDEX", "TROPONINI" in the last 168 hours. CBG: Recent Labs  Lab 10/15/21 0726 10/15/21 1622 10/15/21 2032 10/16/21 0739 10/16/21 1151  GLUCAP 98 136* 98 87 92   Iron Studies: No results for input(s): "IRON", "TIBC", "TRANSFERRIN", "FERRITIN" in the last 72 hours. Lab Results  Component Value Date   INR 1.3 (H) 10/09/2021   INR 1.3 (H) 09/10/2021   INR 1.3 (H) 07/10/2021   Studies/Results: IR Removal  Tun Cv Cath W/O FL  Result Date: 10/15/2021 INDICATION: Request for tunneled dialysis catheter removal due to infection with replacement in 48 hours. EXAM: REMOVAL OF TUNNELED HEMODIALYSIS CATHETER MEDICATIONS: 10 mL 1 % lidocaine COMPLICATIONS: None immediate. PROCEDURE: Informed verbal consent was obtained from the patient following an explanation of the procedure, risks, benefits and alternatives to treatment. A time out was performed prior to the initiation of the procedure. Sterile technique was utilized including mask, sterile gloves, sterile drape, and hand hygiene. Betadine was used to prep the patient's right neck, chest and existing catheter. 1% lidocaine was injected around the catheter and the subcutaneous tunnel. The catheter was dissected out using curved hemostats until the cuff was freed from the surrounding fibrous sheath. The catheter was removed intact. Hemostasis was obtained with manual compression. A dressing was placed. The patient  tolerated the procedure well without immediate post procedural complication. IMPRESSION: Successful removal of tunneled dialysis catheter. Read by: Narda Rutherford, AGNP-BC Electronically Signed   By: Corrie Mckusick D.O.   On: 10/15/2021 18:23    Medications:  sodium chloride 10 mL/hr at 10/09/21 1410   ceFEPime (MAXIPIME) IV 2 g (10/15/21 1618)   magnesium sulfate bolus IVPB      amLODipine  10 mg Oral Daily   atorvastatin  10 mg Oral QHS   calcitRIOL  1 mcg Oral Q T,Th,Sa-HD   carvedilol  3.125 mg Oral BID WC   cinacalcet  30 mg Oral Q T,Th,Sa-HD   darbepoetin (ARANESP) injection - DIALYSIS  200 mcg Intravenous Q Sat-HD   docusate sodium  100 mg Oral Daily   feeding supplement  1 Container Oral TID BM   gabapentin  100 mg Oral Q12H   gabapentin  200 mg Oral Q T,Th,Sa-HD   Gerhardt's butt cream   Topical BID   heparin injection (subcutaneous)  5,000 Units Subcutaneous Q8H   metoCLOPramide (REGLAN) injection  5 mg Intravenous Q6H   metroNIDAZOLE  500 mg Oral Q12H   multivitamin  1 tablet Oral QHS   pantoprazole  40 mg Oral BID   polyethylene glycol  17 g Oral BID   sodium chloride flush  3 mL Intravenous Q12H   sodium chloride flush  5 mL Intracatheter Q8H    Dialysis Orders: [name] TTS 4h 79min, BFR 400, EDW 151 kg, 1K/2.5Ca bath, TDC Hep 2,600 load and 1,500 hourly-Total dose 9275 units per treatment.  - mircera 200ug q2 due 09/10/21 - calcitriol 1 ug tiw - sensipar 30 mg tiw  Assessment/Plan: Septic shock: On admit requiring short-term pressors and again after TEE. Found to have bacteremia with bacteroides thetaiotaomicron and liver abscess and concern for splenic abscess. Appreciate ID -s/p TDC (IR) and L TL (IV team) catheter removal yesterday for line holiday. Remains on course of Cefepime with HD and oral metronidazole.  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 Splenic Infarct/abdominal pain: Reviewed Primary's last note: heparin drip  stopped d/t likelihood for septic embolus rather than an splenic infarct. Splenic/Hepatic abscesses: S/p drain placed in IR 09/29/2021.  ESRD: Continue HD per usual TTS schedule-received HD yesterday and now on line holiday for 48-72 hours. Will assess volume status and labs closley. Pt has requested 4 hour treatments instead of 4 hr 5min. Advised we can try 4 hour treatments as long as labs are acceptable and we are removing adequate amounts of fluid. No heparin with HD. Chronic hypoxic resp failure - At baseline, she is on  3L O2 at home. HTN/volume:  Optimize UF with HD. She has now reached her old EDW but need to lower further. CXR repeated still shows vascular congestion.  Anemia of ESRD: S/p 6 units PRBCs during this hospitalization. Hgb now 9.5. Continue Aranesp 200 mcg q Saturday. Continue to transfuse for Hgb after 7.     Secondary Hyperparathyroidism: CorrCa/Phos ok, requesting to hold binder due to constipation but resume if phos trends back up. Continue VDRA + sensipar.   T2DM- per primary team Malnutrition in setting of Morbid Obesity - Albumin very low. Continue protein supplements.   Tobie Poet, NP [facility] 10/16/2021,12:00 PM  LOS: 36 days

## 2021-10-16 NOTE — Progress Notes (Signed)
PROGRESS NOTE    Courtney Gray  JWJ:[id number] DOB: Aug 19, 1970 DOA: 09/09/2021 PCP: Monico Blitz, MD     Brief Narrative:  Courtney Gray is a 51 year old female with end-stage renal disease, chronic HFpEF with grade 2 diastolic dysfunction, insulin requiring diabetes mellitus, morbid obesity chronically on 2 L of oxygen, peripheral vascular disease status post right AKA on 5/10 for critical limb ischemia. The patient presented to the ED on 09/09/2021 with severe left upper quadrant pain and CT revealed a splenic infarct.  She was started on IV heparin and briefly required pressors for shock.  6/17 the patient grew out Bacteroides Thetaiotaomicron in anaerobic bottle of one set 6/19 amputation of the third digit on the left hand for gangrene 6/23 TEE was negative for endocarditis-calcified mass noted in right atrium of unclear etiology-CT revealed liver abscess and subcapsular splenic hematoma-Eliquis held 6/25 CT-guided drainage of liver abscess-culture negative- drain removed on 7/3 7/5-left AKA for left first and second gangrene 7/7-drain placed into splenic abscess with 350 cc of pus- culture NGTD 7/11--line holiday, TDC removed   New events last 24 hours / Subjective: Patient denies any physical complaints today.  Wondering what the plan is.  Per nursing, unable to draw any labs as patient is a difficult stick  Assessment & Plan:   Principal Problem:   Splenic infarction Active Problems:   Lactic acidosis   Elevated troponin level not due myocardial infarction   Chronic respiratory failure with hypoxia (HCC)   ESRD (end stage renal disease) on dialysis ([place])   Uncontrolled type 2 diabetes mellitus with hypoglycemia, with long-term current use of insulin (HCC)   Essential hypertension   Mixed diabetic hyperlipidemia associated with type 2 diabetes mellitus (HCC)   Leukocytosis   Dry gangrene (HCC)   Hypotension   PVD (peripheral vascular disease) (HCC)   Hx of AKA (above knee  amputation), right (HCC)   Pressure injury of skin   Hepatic abscess   Splenic infarction, thought to be secondary to septic embolus -Heparin was discontinued as splenic infarction was thought to be secondary to septic embolus and not a blood clot  Septic shock secondary to bacteremia, Bacteroides thetaiotaomicron -Appreciate infectious disease.  Receiving cefepime with dialysis + PO flagyl, end date 11/08/2021 -Tunneled dialysis catheter and left TL catheter removed removed 7/11 in setting of persistent leukocytosis to give line holiday  Hepatic and splenic abscesses -Abscess culture showing Staph epidermidis, pansensitive -Continue to monitor drain output from left upper quadrant splenic drain  Anemia of chronic disease -Received 1 unit packed red blood cells 7/10  ESRD on dialysis TTSa -Nephrology following  Uncontrolled type 2 diabetes mellitus with hypoglycemia -A1c 5.7 -Sliding scale insulin discontinued as patient has been refusing it  HTN -Norvasc, coreg   HLD -Lipitor   PAD -Status post right AKA -Status post left AKA for gangrene.  Wound VAC in place until 7/15.  Vascular surgery following  Status post left middle finger amputation by hand surgery for gangrene -Observable sutures in place -Continue care with warm and soapy water -No dressing is needed, can use Band-Aids if needed to prevent sutures from catching on fabric      In agreement with assessment of the pressure ulcer as below:  Pressure Injury 09/11/21 Buttocks Left Stage 2 -  Partial thickness loss of dermis presenting as a shallow open injury with a red, pink wound bed without slough. pink (Active)  09/11/21 0000  Location: Buttocks  Location Orientation: Left  Staging: Stage 2 -  Partial  thickness loss of dermis presenting as a shallow open injury with a red, pink wound bed without slough.  Wound Description (Comments): pink  Present on Admission: Yes  Dressing Type Foam - Lift dressing to  assess site every shift 10/16/21 0900     Pressure Injury 09/11/21 Buttocks Right Stage 2 -  Partial thickness loss of dermis presenting as a shallow open injury with a red, pink wound bed without slough. pink (Active)  09/11/21 0000  Location: Buttocks  Location Orientation: Right  Staging: Stage 2 -  Partial thickness loss of dermis presenting as a shallow open injury with a red, pink wound bed without slough.  Wound Description (Comments): pink  Present on Admission: Yes  Dressing Type Foam - Lift dressing to assess site every shift 10/14/21 1945     Nutrition Problem: Inadequate oral intake Etiology: nausea   DVT prophylaxis:  heparin injection 5,000 Units Start: 10/15/21 1600 SCD's Start: 10/09/21 1713  Code Status: Full Family Communication: None at bedside  Disposition Plan:  Status is: Inpatient Remains inpatient appropriate because: Continue to monitor infection   Antimicrobials:  Anti-infectives (From admission, onward)    Start     Dose/Rate Route Frequency Ordered Stop   10/16/21 2200  metroNIDAZOLE (FLAGYL) tablet 500 mg        500 mg Oral Every 12 hours 10/16/21 1128 11/09/21 0959   09/25/21 0915  metroNIDAZOLE (FLAGYL) IVPB 500 mg  Status:  Discontinued        500 mg 100 mL/hr over 60 Minutes Intravenous Every 12 hours 09/25/21 0822 10/16/21 1128   09/13/21 1000  vancomycin (VANCOCIN) IVPB 1000 mg/200 mL premix        1,000 mg 200 mL/hr over 60 Minutes Intravenous STAT 09/13/21 0904 09/13/21 1140   09/10/21 1200  vancomycin (VANCOCIN) IVPB 1000 mg/200 mL premix  Status:  Discontinued        1,000 mg 200 mL/hr over 60 Minutes Intravenous Every T-Th-Sa (Hemodialysis) 09/10/21 0620 09/17/21 1013   09/10/21 1200  ceFEPIme (MAXIPIME) 2 g in sodium chloride 0.9 % 100 mL IVPB        2 g 200 mL/hr over 30 Minutes Intravenous Every T-Th-Sa (Hemodialysis) 09/10/21 1107 11/08/21 2359   09/10/21 0445  vancomycin (VANCOREADY) IVPB 2000 mg/400 mL        2,000 mg 200  mL/hr over 120 Minutes Intravenous  Once 09/10/21 0435 09/10/21 0800   09/10/21 0430  ceFEPIme (MAXIPIME) 2 g in sodium chloride 0.9 % 100 mL IVPB        2 g 200 mL/hr over 30 Minutes Intravenous  Once 09/10/21 0420 09/10/21 0550        Objective: Vitals:   10/15/21 1254 10/15/21 2026 10/16/21 0556 10/16/21 0914  BP:  (!) 141/47 (!) 126/29 (!) 145/69  Pulse:  78 79 82  Resp:  20 20 17   Temp:  98.7 F (37.1 C) 98.6 F (37 C) 97.7 F (36.5 C)  TempSrc:    Oral  SpO2:   98% 100%  Weight: (!) 144.8 kg     Height:        Intake/Output Summary (Last 24 hours) at 10/16/2021 1207 Last data filed at 10/16/2021 0600 Gross per 24 hour  Intake 538.75 ml  Output --  Net 538.75 ml   Filed Weights   10/15/21 0500 10/15/21 0851 10/15/21 1254  Weight: (!) 149.5 kg (!) 147.9 kg (!) 144.8 kg    Examination:  General exam: Appears calm and comfortable, morbidly obese  Respiratory system: Clear to auscultation. Respiratory effort normal. No respiratory distress. No conversational dyspnea.  On room air Cardiovascular system: S1 & S2 heard, RRR. No murmurs.  Gastrointestinal system: Abdomen is nondistended, soft and nontender. Normal bowel sounds heard. Central nervous system: Alert and oriented. Extremities: Bilateral lower extremity AKA, left middle finger amputation Psychiatry: Judgement and insight appear normal. Mood & affect appropriate.   Data Reviewed: I have personally reviewed following labs and imaging studies  CBC: Recent Labs  Lab 10/10/21 0330 10/10/21 1615 10/11/21 0503 10/11/21 2031 10/12/21 0315 10/12/21 1700 10/13/21 0504 10/14/21 0353 10/14/21 1540 10/15/21 0421  WBC 31.0*  --  30.3*  --  20.7*  --   --  14.3*  --  15.8*  HGB 6.6*   < > 6.7*   < > 7.4* 7.8* 7.5* 6.4* 9.0* 9.5*  HCT 22.9*   < > 23.1*   < > 25.6* 25.8* 25.1* 22.4* 29.5* 30.7*  MCV 98.3  --  98.3  --  97.7  --   --  99.6  --  93.6  PLT 310  --  242  --  217  --   --  158  --  199   < > = values  in this interval not displayed.   Basic Metabolic Panel: Recent Labs  Lab 10/09/21 1342 10/10/21 0330 10/11/21 0503 10/12/21 0315 10/15/21 0421  NA 131* 130* 134* 133* 130*  K 3.7 4.9 4.0 4.0 3.8  CL 95* 95* 96* 96* 94*  CO2  --  23 28 24 24   GLUCOSE 88 193* 160* 120* 91  BUN 20 25* 20 28* 32*  CREATININE 5.50* 5.89* 4.33* 5.25* 6.31*  CALCIUM  --  7.9* 7.8* 7.3* 7.6*  PHOS  --  6.7* 4.6 5.1* 5.7*   GFR: Estimated Creatinine Clearance: 15.3 mL/min (A) (by C-G formula based on SCr of 6.31 mg/dL (H)). Liver Function Tests: Recent Labs  Lab 10/10/21 0330 10/11/21 0503 10/12/21 0315 10/15/21 0421  ALBUMIN 2.0* 1.9* 2.0* 2.0*   No results for input(s): "LIPASE", "AMYLASE" in the last 168 hours. No results for input(s): "AMMONIA" in the last 168 hours. Coagulation Profile: Recent Labs  Lab 10/09/21 1219  INR 1.3*   Cardiac Enzymes: No results for input(s): "CKTOTAL", "CKMB", "CKMBINDEX", "TROPONINI" in the last 168 hours. BNP (last 3 results) No results for input(s): "PROBNP" in the last 8760 hours. HbA1C: No results for input(s): "HGBA1C" in the last 72 hours. CBG: Recent Labs  Lab 10/15/21 0726 10/15/21 1622 10/15/21 2032 10/16/21 0739 10/16/21 1151  GLUCAP 98 136* 98 87 92   Lipid Profile: No results for input(s): "CHOL", "HDL", "LDLCALC", "TRIG", "CHOLHDL", "LDLDIRECT" in the last 72 hours. Thyroid Function Tests: No results for input(s): "TSH", "T4TOTAL", "FREET4", "T3FREE", "THYROIDAB" in the last 72 hours. Anemia Panel: No results for input(s): "VITAMINB12", "FOLATE", "FERRITIN", "TIBC", "IRON", "RETICCTPCT" in the last 72 hours. Sepsis Labs: No results for input(s): "PROCALCITON", "LATICACIDVEN" in the last 168 hours.  Recent Results (from the past 240 hour(s))  Aerobic/Anaerobic Culture w Gram Stain (surgical/deep wound)     Status: None (Preliminary result)   Collection Time: 10/11/21  5:00 PM   Specimen: Abscess  Result Value Ref Range Status    Specimen Description ABSCESS  Final   Special Requests SPLEEN  Final   Gram Stain   Final    ABUNDANT WBC PRESENT,BOTH PMN AND MONONUCLEAR NO ORGANISMS SEEN Performed at [facility], [address] [place], [place] [address]  Culture   Final    RARE STAPHYLOCOCCUS EPIDERMIDIS NO ANAEROBES ISOLATED; CULTURE IN PROGRESS FOR 5 DAYS    Report Status PENDING  Incomplete   Organism ID, Bacteria STAPHYLOCOCCUS EPIDERMIDIS  Final      Susceptibility   Staphylococcus epidermidis - MIC*    CIPROFLOXACIN <=0.5 SENSITIVE Sensitive     ERYTHROMYCIN <=0.25 SENSITIVE Sensitive     GENTAMICIN <=0.5 SENSITIVE Sensitive     OXACILLIN <=0.25 SENSITIVE Sensitive     TETRACYCLINE <=1 SENSITIVE Sensitive     VANCOMYCIN 1 SENSITIVE Sensitive     TRIMETH/SULFA <=10 SENSITIVE Sensitive     CLINDAMYCIN <=0.25 SENSITIVE Sensitive     RIFAMPIN <=0.5 SENSITIVE Sensitive     Inducible Clindamycin NEGATIVE Sensitive     * RARE STAPHYLOCOCCUS EPIDERMIDIS      Radiology Studies: IR Removal Tun Cv Cath W/O FL  Result Date: 10/15/2021 INDICATION: Request for tunneled dialysis catheter removal due to infection with replacement in 48 hours. EXAM: REMOVAL OF TUNNELED HEMODIALYSIS CATHETER MEDICATIONS: 10 mL 1 % lidocaine COMPLICATIONS: None immediate. PROCEDURE: Informed verbal consent was obtained from the patient following an explanation of the procedure, risks, benefits and alternatives to treatment. A time out was performed prior to the initiation of the procedure. Sterile technique was utilized including mask, sterile gloves, sterile drape, and hand hygiene. Betadine was used to prep the patient's right neck, chest and existing catheter. 1% lidocaine was injected around the catheter and the subcutaneous tunnel. The catheter was dissected out using curved hemostats until the cuff was freed from the surrounding fibrous sheath. The catheter was removed intact. Hemostasis was obtained with manual  compression. A dressing was placed. The patient tolerated the procedure well without immediate post procedural complication. IMPRESSION: Successful removal of tunneled dialysis catheter. Read by: Narda Rutherford, AGNP-BC Electronically Signed   By: Corrie Mckusick D.O.   On: 10/15/2021 18:23      Scheduled Meds:  amLODipine  10 mg Oral Daily   atorvastatin  10 mg Oral QHS   calcitRIOL  1 mcg Oral Q T,Th,Sa-HD   carvedilol  3.125 mg Oral BID WC   cinacalcet  30 mg Oral Q T,Th,Sa-HD   darbepoetin (ARANESP) injection - DIALYSIS  200 mcg Intravenous Q Sat-HD   docusate sodium  100 mg Oral Daily   feeding supplement  1 Container Oral TID BM   gabapentin  100 mg Oral Q12H   gabapentin  200 mg Oral Q T,Th,Sa-HD   Gerhardt's butt cream   Topical BID   heparin injection (subcutaneous)  5,000 Units Subcutaneous Q8H   metoCLOPramide (REGLAN) injection  5 mg Intravenous Q6H   metroNIDAZOLE  500 mg Oral Q12H   multivitamin  1 tablet Oral QHS   pantoprazole  40 mg Oral BID   polyethylene glycol  17 g Oral BID   sodium chloride flush  3 mL Intravenous Q12H   sodium chloride flush  5 mL Intracatheter Q8H   Continuous Infusions:  sodium chloride 10 mL/hr at 10/09/21 1410   ceFEPime (MAXIPIME) IV 2 g (10/15/21 1618)   magnesium sulfate bolus IVPB       LOS: 36 days     Dessa Phi, DO [facility] 10/16/2021, 12:07 PM   Available via Epic secure chat 7am-7pm After these hours, please refer to coverage provider listed on [email or web address]

## 2021-10-16 NOTE — Progress Notes (Signed)
Pt's case submitted to [facility] admissions yesterday for review. Pt's referral being reviewed by [facility] SW. Awaiting final determination. Will assist with out-pt HD needs.   Melven Sartorius Renal Navigator [phone]

## 2021-10-16 NOTE — Progress Notes (Signed)
[facility]  Date of Admission:  09/09/2021     Principal Problem:   Splenic infarction Active Problems:   Uncontrolled type 2 diabetes mellitus with hypoglycemia, with long-term current use of insulin (HCC)   Essential hypertension   ESRD (end stage renal disease) on dialysis (HCC)   Chronic respiratory failure with hypoxia (HCC)   Leukocytosis   Dry gangrene (HCC)   Lactic acidosis   Mixed diabetic hyperlipidemia associated with type 2 diabetes mellitus (HCC)   Elevated troponin level not due myocardial infarction   Hypotension   PVD (peripheral vascular disease) (HCC)   Hx of AKA (above knee amputation), right (HCC)   Pressure injury of skin   Hepatic abscess          Assessment: 78 YF admitted with:  #Hepatic abscess status post drain placement-resolved #Subcapsular fluid collection/splenic infarcts-increase in size SP drian placement on 7/7 #Bacteroides bacteremia on 6/17(cleared on 6/23) 2/2 CLABSI vs  intraabdominal abscess #Persistent leukocytosis-trending down #Peripheral gangrene(left second toe) SP left AKA on 7/5 #ESRD on IHD via RIJ -RIJ placed on 6/7, failed avf -Patient had drain placed hepatic abscess(CT notes hepatic collection on 6/23), cultures negative. -CT on 7/3 showed complete resolution of hepatic collection/abscess.  Large bilobed subcapsular fluid collection in spleen measuring 10.7 cm small-to-moderate loculated left pleural effusion.  Perinephric stranding and fluid associated with both kidneys is increased - Leukocytosis trending down following AKA and splenic drain placement(7/7). Splenic Cx+ Staph epi(Oxacillin sens) which I suspect is contamination. -RIJ an dCVC remove don 7/11 in the setting of persitent leukocytosis and concern for CLABSI. Please allow for 72h lin eholiday Recommendations: -Continue cefepime with HD and metronidazole PO for 4 weeks form splenic drain placement (EOT 8/4) -Follow-up in ID  clinic with myself on 7/28 -Line holiday, plan for new lin eon [id number](64Q). Ordered blood Cx after line removal.  ID will sign off, please engage with any question or concerns.  Microbiology:   Antibiotics: Cefepime and metronidazole  Cultures: Blood 6/25 1/2 bacteroides thetaiotaomicron  Other 6/17 hepatic abscess Cx NG 6/25 NG  SUBJECTIVE: Resting in bed. No new complaints.  Interval:wbc 15.8k. afebrile overnight.   Review of Systems: Review of Systems  All other systems reviewed and are negative.    Scheduled Meds:  amLODipine  10 mg Oral Daily   atorvastatin  10 mg Oral QHS   calcitRIOL  1 mcg Oral Q T,Th,Sa-HD   carvedilol  3.125 mg Oral BID WC   cinacalcet  30 mg Oral Q T,Th,Sa-HD   darbepoetin (ARANESP) injection - DIALYSIS  200 mcg Intravenous Q Sat-HD   docusate sodium  100 mg Oral Daily   feeding supplement  1 Container Oral TID BM   gabapentin  100 mg Oral Q12H   gabapentin  200 mg Oral Q T,Th,Sa-HD   Gerhardt's butt cream   Topical BID   heparin injection (subcutaneous)  5,000 Units Subcutaneous Q8H   metoCLOPramide (REGLAN) injection  5 mg Intravenous Q6H   metroNIDAZOLE  500 mg Oral Q12H   multivitamin  1 tablet Oral QHS   pantoprazole  40 mg Oral BID   polyethylene glycol  17 g Oral BID   sodium chloride flush  3 mL Intravenous Q12H   sodium chloride flush  5 mL Intracatheter Q8H   Continuous Infusions:  sodium chloride 10 mL/hr at 10/09/21 1410   ceFEPime (MAXIPIME) IV 2 g (10/15/21 1618)   magnesium sulfate bolus IVPB  PRN Meds:.acetaminophen **OR** acetaminophen, albuterol, alum & mag hydroxide-simeth, bisacodyl, diphenhydrAMINE, glucagon (human recombinant), glucose, guaiFENesin-dextromethorphan, HYDROmorphone (DILAUDID) injection, lidocaine (PF), lidocaine-prilocaine, magnesium sulfate bolus IVPB, naLOXone (NARCAN)  injection, nitroGLYCERIN, ondansetron **OR** ondansetron (ZOFRAN) IV, oxyCODONE, oxyCODONE, pentafluoroprop-tetrafluoroeth,  phenol, polyethylene glycol, prochlorperazine, senna-docusate Allergies  Allergen Reactions   Benzonatate Other (See Comments)    Hallucinations   Hydralazine Other (See Comments)    Hallucinations   Amoxicillin Itching and Nausea And Vomiting   Clonidine Other (See Comments)    Altered mental state, insomnia    Chlorhexidine Itching    Patient reports it is the CHG baths that cause her itching not the CHG used for caring for her IV/HD access.     Morphine Itching   Oxycodone Itching    Pt tolerated hospital admission 07/2021    OBJECTIVE: Vitals:   10/15/21 1254 10/15/21 2026 10/16/21 0556 10/16/21 0914  BP:  (!) 141/47 (!) 126/29 (!) 145/69  Pulse:  78 79 82  Resp:  20 20 17   Temp:  98.7 F (37.1 C) 98.6 F (37 C) 97.7 F (36.5 C)  TempSrc:    Oral  SpO2:   98% 100%  Weight: (!) 144.8 kg     Height:       Body mass index is 53.12 kg/m.  Physical Exam Constitutional:      Appearance: Normal appearance.  HENT:     Head: Normocephalic and atraumatic.     Right Ear: Tympanic membrane normal.     Left Ear: Tympanic membrane normal.     Nose: Nose normal.     Mouth/Throat:     Mouth: Mucous membranes are moist.  Eyes:     Extraocular Movements: Extraocular movements intact.     Conjunctiva/sclera: Conjunctivae normal.     Pupils: Pupils are equal, round, and reactive to light.  Cardiovascular:     Rate and Rhythm: Normal rate and regular rhythm.     Heart sounds: No murmur heard.    No friction rub. No gallop.  Pulmonary:     Effort: Pulmonary effort is normal.     Breath sounds: Normal breath sounds.  Abdominal:     General: Abdomen is flat.     Palpations: Abdomen is soft.     Comments: Left abdominal drain  Musculoskeletal:        General: Normal range of motion.     Comments: Left middle finger wound Left AKA  Skin:    General: Skin is warm and dry.  Neurological:     General: No focal deficit present.     Mental Status: She is alert and oriented to  person, place, and time.  Psychiatric:        Mood and Affect: Mood normal.       Lab Results Lab Results  Component Value Date   WBC 15.8 (H) 10/15/2021   HGB 9.5 (L) 10/15/2021   HCT 30.7 (L) 10/15/2021   MCV 93.6 10/15/2021   PLT 199 10/15/2021    Lab Results  Component Value Date   CREATININE 6.31 (H) 10/15/2021   BUN 32 (H) 10/15/2021   NA 130 (L) 10/15/2021   K 3.8 10/15/2021   CL 94 (L) 10/15/2021   CO2 24 10/15/2021    Lab Results  Component Value Date   ALT 10 09/14/2021   AST 16 09/14/2021   ALKPHOS 124 09/14/2021   BILITOT 0.7 09/14/2021        Laurice Record, [facility] 10/16/2021, 1:47 PM

## 2021-10-16 NOTE — Progress Notes (Signed)
Referring Physician(s): Dr. Tana Coast  Supervising Physician: Jacqulynn Cadet  Patient Status:  [facility] - In-pt  Chief Complaint: Splenic Abscess s/p drain placement in IR 10/11/21 by Dr.Watts  Subjective: Patient sitting up in bed - she just finished working with physical therapy and is about to eat some lunch. She complains of the wound vac on her left stump - states it's uncomfortable/painful.   Allergies: Benzonatate, Hydralazine, Amoxicillin, Clonidine, Chlorhexidine, Morphine, and Oxycodone  Medications: Prior to Admission medications   Medication Sig Start Date End Date Taking? Authorizing Provider  acetaminophen (TYLENOL) 500 MG tablet Take 1,000 mg by mouth as needed for moderate pain.   Yes [provider]  amLODipine (NORVASC) 10 MG tablet Take 1 tablet (10 mg total) by mouth daily. Patient taking differently: Take 10 mg by mouth daily as needed (Blood pressusre). 11/29/20  Yes Roxan Hockey, MD  aspirin EC 81 MG tablet Take 1 tablet (81 mg total) by mouth daily with breakfast. 11/28/20  Yes Emokpae, Courage, MD  atorvastatin (LIPITOR) 10 MG tablet Take 1 tablet (10 mg total) by mouth daily. Patient taking differently: Take 10 mg by mouth at bedtime. 11/28/20  Yes Emokpae, Courage, MD  calcium carbonate (TUMS SMOOTHIES) 750 MG chewable tablet Chew 1,500 mg by mouth See admin instructions. Take 1500 mg with each meal and 1500 mg with each snack   Yes [provider]  carvedilol (COREG) 6.25 MG tablet Take 1 tablet (6.25 mg total) by mouth 2 (two) times daily with a meal. Patient taking differently: Take 6.25 mg by mouth 2 (two) times daily as needed (if BP > 150). 08/28/21 09/27/21 Yes Pahwani, Einar Grad, MD  diphenhydrAMINE (BENADRYL) 25 MG tablet Take 25 mg by mouth 2 (two) times daily as needed for allergies.   Yes [provider]  docusate sodium (COLACE) 100 MG capsule Take 100 mg by mouth 2 (two) times daily.   Yes [provider]  gabapentin  (NEURONTIN) 100 MG capsule Take 1 capsule (100 mg total) by mouth 2 (two) times daily. Patient taking differently: Take 100 mg by mouth See admin instructions. Take 100 mg by mouth twice a day on non dialysis days. On dialysis day take 100 mg in the morning, 200 mg after dialysis, and then 100 mg at bedtime. 08/28/21 09/27/21 Yes Pahwani, Einar Grad, MD  glipiZIDE (GLUCOTROL XL) 2.5 MG 24 hr tablet Take 1 tablet (2.5 mg total) by mouth daily with breakfast. 08/28/21 09/27/21 Yes Pahwani, Einar Grad, MD  HYDROcodone-acetaminophen (NORCO/VICODIN) 5-325 MG tablet Take 1-2 tablets by mouth every 4 (four) hours as needed for moderate pain. Patient taking differently: Take 1 tablet by mouth every 4 (four) hours as needed for moderate pain. 08/19/21  Yes Pahwani, Einar Grad, MD  insulin glargine (LANTUS) 100 UNIT/ML Solostar Pen Inject 12 Units into the skin daily.   Yes [provider]  loperamide (IMODIUM A-D) 2 MG tablet Take 4 mg by mouth as needed for diarrhea or loose stools.   Yes [provider]  nitroGLYCERIN (NITROSTAT) 0.4 MG SL tablet Place 1 tablet (0.4 mg total) under the tongue every 5 (five) minutes as needed for chest pain. 05/24/21  Yes [place] Eric J, DO  pantoprazole (PROTONIX) 40 MG tablet Take 1 tablet (40 mg total) by mouth daily. 11/29/20  Yes Roxan Hockey, MD  simethicone (MYLICON) 229 MG chewable tablet Chew 125 mg by mouth in the morning and at bedtime.   Yes [provider]  tiZANidine (ZANAFLEX) 4 MG capsule Take 4 mg  by mouth in the morning and at bedtime.   Yes [provider]  doxycycline (VIBRAMYCIN) 100 MG capsule Take 100 mg by mouth 2 (two) times daily. Patient not taking: Reported on 09/10/2021 08/30/21   [provider]  lisinopril (ZESTRIL) 20 MG tablet Take 1 tablet (20 mg total) by mouth daily. Patient not taking: Reported on 09/10/2021 08/28/21 09/27/21  Darliss Cheney, MD  traMADol (ULTRAM) 50 MG tablet Take 1 tablet (50 mg total) by mouth every 6  (six) hours as needed for moderate pain. Patient not taking: Reported on 09/10/2021 08/28/21   Darliss Cheney, MD     Vital Signs: BP (!) 145/69   Pulse 82   Temp 97.7 F (36.5 C) (Oral)   Resp 17   Ht 5\' 5"  (1.651 m)   Wt (!) 319 lb 3.6 oz (144.8 kg)   SpO2 100%   BMI 53.12 kg/m   Physical Exam Constitutional:      General: She is not in acute distress.    Appearance: She is obese. She is not ill-appearing.  Pulmonary:     Effort: Pulmonary effort is normal.  Abdominal:     Comments: LUQ drain to gravity. Approximately 40 ml of sanguinopurulent fluid in bag. Dressing is clean/dry/intact. Patient states the nurses have been flushing the drain. No tenderness at the site.   Musculoskeletal:     Comments: Bilateral AKAs. Wound vac on the left.   Skin:    General: Skin is warm and dry.  Neurological:     Mental Status: She is alert and oriented to person, place, and time.     Imaging: IR Removal Tun Cv Cath W/O FL  Result Date: 10/15/2021 INDICATION: Request for tunneled dialysis catheter removal due to infection with replacement in 48 hours. EXAM: REMOVAL OF TUNNELED HEMODIALYSIS CATHETER MEDICATIONS: 10 mL 1 % lidocaine COMPLICATIONS: None immediate. PROCEDURE: Informed verbal consent was obtained from the patient following an explanation of the procedure, risks, benefits and alternatives to treatment. A time out was performed prior to the initiation of the procedure. Sterile technique was utilized including mask, sterile gloves, sterile drape, and hand hygiene. Betadine was used to prep the patient's right neck, chest and existing catheter. 1% lidocaine was injected around the catheter and the subcutaneous tunnel. The catheter was dissected out using curved hemostats until the cuff was freed from the surrounding fibrous sheath. The catheter was removed intact. Hemostasis was obtained with manual compression. A dressing was placed. The patient tolerated the procedure well without  immediate post procedural complication. IMPRESSION: Successful removal of tunneled dialysis catheter. Read by: Narda Rutherford, AGNP-BC Electronically Signed   By: Corrie Mckusick D.O.   On: 10/15/2021 18:23    Labs:  CBC: Recent Labs    10/11/21 0503 10/11/21 2031 10/12/21 0315 10/12/21 1700 10/13/21 0504 10/14/21 0353 10/14/21 1540 10/15/21 0421  WBC 30.3*  --  20.7*  --   --  14.3*  --  15.8*  HGB 6.7*   < > 7.4*   < > 7.5* 6.4* 9.0* 9.5*  HCT 23.1*   < > 25.6*   < > 25.1* 22.4* 29.5* 30.7*  PLT 242  --  217  --   --  158  --  199   < > = values in this interval not displayed.    COAGS: Recent Labs    07/10/21 0627 09/10/21 0403 09/28/21 0151 09/30/21 0500 09/30/21 1049 10/01/21 0847 10/02/21 0621 10/09/21 1219  INR 1.3* 1.3*  --   --   --   --   --  1.3*  APTT  --   --    < > 85* 72* 69* 65*  --    < > = values in this interval not displayed.    BMP: Recent Labs    10/10/21 0330 10/11/21 0503 10/12/21 0315 10/15/21 0421  NA 130* 134* 133* 130*  K 4.9 4.0 4.0 3.8  CL 95* 96* 96* 94*  CO2 23 28 24 24   GLUCOSE 193* 160* 120* 91  BUN 25* 20 28* 32*  CALCIUM 7.9* 7.8* 7.3* 7.6*  CREATININE 5.89* 4.33* 5.25* 6.31*  GFRNONAA 8* 12* 9* 7*    LIVER FUNCTION TESTS: Recent Labs    09/03/21 2040 09/10/21 0031 09/10/21 0920 09/11/21 0324 09/14/21 0636 09/19/21 0915 10/10/21 0330 10/11/21 0503 10/12/21 0315 10/15/21 0421  BILITOT 0.4 0.7  --  0.8 0.7  --   --   --   --   --   AST 23 18  --  30 16  --   --   --   --   --   ALT 16 11  --  11 10  --   --   --   --   --   ALKPHOS 77 79  --  76 124  --   --   --   --   --   PROT 7.7 7.8  --  7.1 6.0*  --   --   --   --   --   ALBUMIN 3.0* 2.8*   < > 2.4* 1.9*  1.9*   < > 2.0* 1.9* 2.0* 2.0*   < > = values in this interval not displayed.    Assessment and Plan:  Splenic Abscess s/p drain placement in IR 10/11/21 by Dr. Pascal Lux  Patient is afebrile but has increasing leukocytosis. IR removed her tunneled  dialysis catheter yesterday for a line holiday with plans to replace on Friday.   Drain Location: LUQ Size: Fr size: 12 Fr Date of placement: 10/11/21 Currently to: Drain collection device: gravity 24 hour output:  Output by Drain (mL) 10/14/21 0700 - 10/14/21 1459 10/14/21 1500 - 10/14/21 2259 10/14/21 2300 - 10/15/21 0659 10/15/21 0700 - 10/15/21 1459 10/15/21 1500 - 10/15/21 2259 10/15/21 2300 - 10/16/21 0659 10/16/21 0700 - 10/16/21 1459 10/16/21 1500 - 10/16/21 1534  Closed System Drain 1 LUQ Other (Comment) 12 Fr.  0 50       Negative Pressure Wound Therapy Leg Distal;Left            Interval imaging/drain manipulation:  None  Current examination: Flushes/aspirates easily.  Insertion site unremarkable. Suture and stat lock in place. Dressed appropriately.   Plan: Continue TID flushes with 5 cc NS. Record output Q shift. Dressing changes QD or PRN if soiled.  Call IR APP or on call IR MD if difficulty flushing or sudden change in drain output.  Repeat imaging/possible drain injection once output < 10 mL/QD (excluding flush material). Consideration for drain removal if output is < 10 mL/QD (excluding flush material), pending discussion with the providing surgical service.  Discharge planning: Please contact IR APP or on call IR MD prior to patient d/c to ensure appropriate follow up plans are in place. Typically patient will follow up with IR clinic 10-14 days post d/c for repeat imaging/possible drain injection. IR scheduler will contact patient with date/time of appointment. Patient will need to flush drain QD with 5 cc NS, record output QD, dressing changes every 2-3 days or earlier if soiled.  IR will continue to follow - please call with questions or concerns.  Electronically Signed: Soyla Dryer, AGACNP-BC [phone] 10/16/2021, 3:24 PM   I spent a total of 15 Minutes at the the patient's bedside AND on the patient's hospital floor or unit, greater than 50% of which  was counseling/coordinating care for splenic abscess drain.

## 2021-10-16 NOTE — Progress Notes (Signed)
Outpatient Antibiotic Plan   Indication: Intra-abdominal abscesses  Regimen: Cefepime 2 gm w/ HD every TTHSa + Metronidazole PO 500 mg Q 12 hours  End date: 11/08/2021  No formal OPAT will be done as patient will receive IV Antibiotics through her dialysis center.     Thank you for allowing pharmacy to be a part of this patient's care.  Jimmy Footman, PharmD, BCPS, BCIDP Infectious Diseases Clinical Pharmacist Phone: [phone] 10/16/2021, 11:29 AM

## 2021-10-17 ENCOUNTER — Encounter (HOSPITAL_COMMUNITY): Payer: Self-pay | Admitting: Internal Medicine

## 2021-10-17 DIAGNOSIS — D735 Infarction of spleen: Secondary | ICD-10-CM | POA: Diagnosis not present

## 2021-10-17 LAB — GLUCOSE, CAPILLARY
Glucose-Capillary: 81 mg/dL (ref 70–99)
Glucose-Capillary: 110 mg/dL — ABNORMAL HIGH (ref 70–99)
Glucose-Capillary: 103 mg/dL — ABNORMAL HIGH (ref 70–99)
Glucose-Capillary: 98 mg/dL (ref 70–99)

## 2021-10-17 LAB — CBC WITH DIFFERENTIAL/PLATELET
Abs Immature Granulocytes: 0.21 10*3/uL — ABNORMAL HIGH (ref 0.00–0.07)
Basophils Absolute: 0.1 10*3/uL (ref 0.0–0.1)
Basophils Relative: 1 %
Eosinophils Absolute: 0.4 10*3/uL (ref 0.0–0.5)
Eosinophils Relative: 4 %
HCT: 23.4 % — ABNORMAL LOW (ref 36.0–46.0)
Hemoglobin: 7.5 g/dL — ABNORMAL LOW (ref 12.0–15.0)
Immature Granulocytes: 2 %
Lymphocytes Relative: 21 %
Lymphs Abs: 2.1 10*3/uL (ref 0.7–4.0)
MCH: 30.1 pg (ref 26.0–34.0)
MCHC: 32.1 g/dL (ref 30.0–36.0)
MCV: 94 fL (ref 80.0–100.0)
Monocytes Absolute: 1.3 10*3/uL — ABNORMAL HIGH (ref 0.1–1.0)
Monocytes Relative: 13 %
Neutro Abs: 5.9 10*3/uL (ref 1.7–7.7)
Neutrophils Relative %: 59 %
Platelets: 210 10*3/uL (ref 150–400)
RBC: 2.49 MIL/uL — ABNORMAL LOW (ref 3.87–5.11)
RDW: 16 % — ABNORMAL HIGH (ref 11.5–15.5)
WBC: 9.9 10*3/uL (ref 4.0–10.5)
nRBC: 0 % (ref 0.0–0.2)

## 2021-10-17 LAB — RENAL FUNCTION PANEL
Albumin: 2.2 g/dL — ABNORMAL LOW (ref 3.5–5.0)
Anion gap: 8 (ref 5–15)
BUN: 29 mg/dL — ABNORMAL HIGH (ref 6–20)
CO2: 26 mmol/L (ref 22–32)
Calcium: 8.2 mg/dL — ABNORMAL LOW (ref 8.9–10.3)
Chloride: 93 mmol/L — ABNORMAL LOW (ref 98–111)
Creatinine, Ser: 3.75 mg/dL — ABNORMAL HIGH (ref 0.44–1.00)
GFR, Estimated: 14 mL/min — ABNORMAL LOW (ref >60)
Glucose, Bld: 119 mg/dL — ABNORMAL HIGH (ref 70–99)
Phosphorus: 3.8 mg/dL (ref 2.5–4.6)
Potassium: 4.7 mmol/L (ref 3.5–5.1)
Sodium: 127 mmol/L — ABNORMAL LOW (ref 135–145)

## 2021-10-17 MED ORDER — VANCOMYCIN HCL IN DEXTROSE 1-5 GM/200ML-% IV SOLN
1000.0000 mg | INTRAVENOUS | Status: AC
  Filled 2021-10-17: qty 200

## 2021-10-17 MED ORDER — HEPARIN SODIUM (PORCINE) 1000 UNIT/ML DIALYSIS
1000.0000 [IU] | INTRAMUSCULAR | Status: DC | PRN
  Filled 2021-10-17: qty 1

## 2021-10-17 MED ORDER — ALTEPLASE 2 MG IJ SOLR: 2.0000 mg | Freq: Once | INTRAMUSCULAR | Status: DC | PRN

## 2021-10-17 NOTE — Consult Note (Addendum)
Chief Complaint: Patient was seen in consultation today for tunneled HD cathete  placement Chief Complaint  Patient presents with   Nausea   Constipation   at the request of Tobie Poet NP  Referring Physician(s): Tobie Poet NP  Supervising Physician: Aletta Edouard  Patient Status: Courtney [facility] - In-pt  History of Present Illness: Courtney [facility] is a 51 y.o. 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   Patient is well known to IR service for previous HD catheter placement/exchange as well as hepatic drain placement on 09/29/21 and splenic drain placement on 10/12/21.  Per Dr. Malachi Carl dictation from 6/7, patient has bilateral IJ occlusion, most recent tunneled hemodialysis catheter was placed on left EJ by Dr. Earleen Newport on 09/12/21.   Courtney [facility] presented to [facility] ED on 09/09/21 with complaints of LUQ abdominal pain and hypoglycemia, she was admitted for septic shock of unknown etiology. She was found to have bacteremia on 6/17, ID has been following.   She underwent hepatic drain placement on 6/25, cx was negative and repeat CT on 7/3 showed resolution of the hepatic abscess, the drain was removed on 7/3. The follow up CT also showed large bilobed subcapsular fluid collection within the spleen. Patient continued to have leukocytosis therefore underwent perisplenic drain placement on 10/12/21, cx grew S. epidermidis.   On 7/11, IR was requested for tunneled dialysis cathter removal for line holiday due to persistent leukocytosis. Of note, blood cx on 6/23 showed NG x 5 days. The catheter was removed at 1732 hours on 10/15/21.   IR was requested for tunneled dialysis catheter placement to be done on Friday.   Patient laying in bed, not in acute distress. She appears lethargic, oriented x 3.   Denise headache, fever, chills, shortness of breath, cough, chest pain, abdominal pain, nausea ,vomiting, and bleeding.   Past Medical History:  Diagnosis Date   Arthritis    Diabetes mellitus without complication ([place])    Dialysis complication, subsequent encounter    Gout    High cholesterol    Hypertension    Renal disorder     Past Surgical History:  Procedure Laterality Date   ABDOMINAL AORTOGRAM W/LOWER EXTREMITY N/A 08/07/2021   Procedure: ABDOMINAL AORTOGRAM W/LOWER EXTREMITY;  Surgeon: Broadus John, MD;  Location: [facility] [facility];  Service: Cardiovascular;  Laterality: N/A;   AMPUTATION Right 08/14/2021   Procedure: AMPUTATION ABOVE KNEE;  Surgeon: Marty Heck, MD;  Location: [place];  Service: Vascular;  Laterality: Right;   AMPUTATION Left 09/23/2021   Procedure: AMPUTATION MIDDLE FINGER;  Surgeon: Sherilyn Cooter, MD;  Location: [place];  Service: Orthopedics;  Laterality: Left;   AMPUTATION Left 10/09/2021   Procedure: LEFT ABOVE KNEE AMPUTATION;  Surgeon: Cherre Robins, MD;  Location: [facility] OR;  Service: Vascular;  Laterality: Left;   AV FISTULA PLACEMENT     x2, unsuccessful.    BUBBLE STUDY  09/27/2021   Procedure: BUBBLE STUDY;  Surgeon: Lelon Perla, MD;  Location: [place];  Service: Cardiovascular;;   IR FLUORO GUIDE CV LINE LEFT  12/14/2020   IR FLUORO GUIDE CV LINE LEFT  02/20/2021   IR FLUORO GUIDE CV LINE LEFT  09/11/2021   IR FLUORO GUIDE CV LINE RIGHT  08/28/2020   IR FLUORO GUIDE CV LINE RIGHT  05/13/2021  IR FLUORO GUIDE CV LINE RIGHT  05/24/2021   IR PERC TUN PERIT CATH WO PORT S&I /IMAG  09/06/2020   IR PTA VENOUS EXCEPT DIALYSIS CIRCUIT  02/20/2021   IR PTA VENOUS EXCEPT DIALYSIS CIRCUIT  05/13/2021   IR REMOVAL TUN CV CATH W/O FL  05/24/2021   IR REMOVAL TUN CV CATH W/O FL  10/15/2021   IR US GUIDE VASC ACCESS LEFT  09/06/2020   IR US GUIDE VASC ACCESS LEFT  09/11/2021   IR US GUIDE VASC ACCESS RIGHT  05/24/2021   IR US GUIDE VASC ACCESS RIGHT   09/10/2021   IR VENO/JUGULAR RIGHT  09/10/2021   IR VENOCAVAGRAM SVC  02/20/2021   TEE WITHOUT CARDIOVERSION N/A 09/27/2021   Procedure: TRANSESOPHAGEAL ECHOCARDIOGRAM (TEE);  Surgeon: Lelon Perla, MD;  Location: [facility];  Service: Cardiovascular;  Laterality: N/A;    Allergies: Benzonatate, Hydralazine, Amoxicillin, Clonidine, Chlorhexidine, Morphine, and Oxycodone  Medications: Prior to Admission medications   Medication Sig Start Date End Date Taking? Authorizing Provider  acetaminophen (TYLENOL) 500 MG tablet Take 1,000 mg by mouth as needed for moderate pain.   Yes [provider]  amLODipine (NORVASC) 10 MG tablet Take 1 tablet (10 mg total) by mouth daily. Patient taking differently: Take 10 mg by mouth daily as needed (Blood pressusre). 11/29/20  Yes Roxan Hockey, MD  aspirin EC 81 MG tablet Take 1 tablet (81 mg total) by mouth daily with breakfast. 11/28/20  Yes Emokpae, Courage, MD  atorvastatin (LIPITOR) 10 MG tablet Take 1 tablet (10 mg total) by mouth daily. Patient taking differently: Take 10 mg by mouth at bedtime. 11/28/20  Yes Emokpae, Courage, MD  calcium carbonate (TUMS SMOOTHIES) 750 MG chewable tablet Chew 1,500 mg by mouth See admin instructions. Take 1500 mg with each meal and 1500 mg with each snack   Yes [provider]  carvedilol (COREG) 6.25 MG tablet Take 1 tablet (6.25 mg total) by mouth 2 (two) times daily with a meal. Patient taking differently: Take 6.25 mg by mouth 2 (two) times daily as needed (if BP > 150). 08/28/21 09/27/21 Yes Pahwani, Einar Grad, MD  diphenhydrAMINE (BENADRYL) 25 MG tablet Take 25 mg by mouth 2 (two) times daily as needed for allergies.   Yes [provider]  docusate sodium (COLACE) 100 MG capsule Take 100 mg by mouth 2 (two) times daily.   Yes [provider]  gabapentin (NEURONTIN) 100 MG capsule Take 1 capsule (100 mg total) by mouth 2 (two) times daily. Patient taking differently: Take 100 mg by  mouth See admin instructions. Take 100 mg by mouth twice a day on non dialysis days. On dialysis day take 100 mg in the morning, 200 mg after dialysis, and then 100 mg at bedtime. 08/28/21 09/27/21 Yes Pahwani, Einar Grad, MD  glipiZIDE (GLUCOTROL XL) 2.5 MG 24 hr tablet Take 1 tablet (2.5 mg total) by mouth daily with breakfast. 08/28/21 09/27/21 Yes Pahwani, Einar Grad, MD  HYDROcodone-acetaminophen (NORCO/VICODIN) 5-325 MG tablet Take 1-2 tablets by mouth every 4 (four) hours as needed for moderate pain. Patient taking differently: Take 1 tablet by mouth every 4 (four) hours as needed for moderate pain. 08/19/21  Yes Pahwani, Einar Grad, MD  insulin glargine (LANTUS) 100 UNIT/ML Solostar Pen Inject 12 Units into the skin daily.   Yes [provider]  loperamide (IMODIUM A-D) 2 MG tablet Take 4 mg by mouth as needed for diarrhea or loose stools.   Yes [provider]  nitroGLYCERIN (NITROSTAT) 0.4 MG  SL tablet Place 1 tablet (0.4 mg total) under the tongue every 5 (five) minutes as needed for chest pain. 05/24/21  Yes [place] Eric J, DO  pantoprazole (PROTONIX) 40 MG tablet Take 1 tablet (40 mg total) by mouth daily. 11/29/20  Yes Roxan Hockey, MD  simethicone (MYLICON) 161 MG chewable tablet Chew 125 mg by mouth in the morning and at bedtime.   Yes [provider]  tiZANidine (ZANAFLEX) 4 MG capsule Take 4 mg by mouth in the morning and at bedtime.   Yes [provider]  doxycycline (VIBRAMYCIN) 100 MG capsule Take 100 mg by mouth 2 (two) times daily. Patient not taking: Reported on 09/10/2021 08/30/21   [provider]  lisinopril (ZESTRIL) 20 MG tablet Take 1 tablet (20 mg total) by mouth daily. Patient not taking: Reported on 09/10/2021 08/28/21 09/27/21  Darliss Cheney, MD  traMADol (ULTRAM) 50 MG tablet Take 1 tablet (50 mg total) by mouth every 6 (six) hours as needed for moderate pain. Patient not taking: Reported on 09/10/2021 08/28/21   Darliss Cheney, MD     Family History   Problem Relation Age of Onset   Diabetes Mother    Diabetes Father     Social History   Socioeconomic History   Marital status: Single    Spouse name: Not on file   Number of children: Not on file   Years of education: Not on file   Highest education level: Not on file  Occupational History   Not on file  Tobacco Use   Smoking status: Never   Smokeless tobacco: Never  Vaping Use   Vaping Use: Never used  Substance and Sexual Activity   Alcohol use: Never   Drug use: Never   Sexual activity: Not on file  Other Topics Concern   Not on file  Social History Narrative   Not on file   Social Determinants of Health   Financial Resource Strain: Not on file  Food Insecurity: Not on file  Transportation Needs: Not on file  Physical Activity: Not on file  Stress: Not on file  Social Connections: Not on file     Review of Systems: A 12 point ROS discussed and pertinent positives are indicated in the HPI above.  All other systems are negative.  Vital Signs: BP (!) 131/53 (BP Location: Left Arm)   Pulse 81   Temp 98.3 F (36.8 C)   Resp 17   Ht 5\' 5"  (1.651 m)   Wt (!) 329 lb 2.4 oz (149.3 kg)   SpO2 97%   BMI 54.77 kg/m    Physical Exam Vitals reviewed.  Constitutional:      General: She is not in acute distress.    Appearance: She is ill-appearing.  HENT:     Head: Normocephalic.     Mouth/Throat:     Pharynx: Oropharynx is clear.  Neck:     Comments: + dressing on left neck  Cardiovascular:     Rate and Rhythm: Normal rate and regular rhythm.     Heart sounds: Normal heart sounds.  Pulmonary:     Effort: Pulmonary effort is normal.     Breath sounds: Normal breath sounds.     Comments: On O2 via [place] Abdominal:     General: Bowel sounds are normal.     Palpations: Abdomen is soft.  Skin:    General: Skin is warm and dry.     Coloration: Skin is not jaundiced or pale.  Comments: Positive LUQ drain to a gravity bag. Site is unremarkable with no  erythema, edema, tenderness, bleeding or drainage. Suture and stat lock in place. Dressing is clean, dry, and intact. 100 ml of  serosanguinous fluid with debris in the bag. Drain aspirates and flushes well.    Neurological:     Mental Status: She is oriented to person, place, and time.  Psychiatric:        Mood and Affect: Mood normal.        Behavior: Behavior normal.        Judgment: Judgment normal.     MD Evaluation Airway: WNL Heart: WNL Abdomen: WNL Chest/ Lungs: WNL ASA  Classification: 3 Mallampati/Airway Score: Two  Imaging: IR Removal Tun Cv Cath W/O FL  Result Date: 10/15/2021 INDICATION: Request for tunneled dialysis catheter removal due to infection with replacement in 48 hours. EXAM: REMOVAL OF TUNNELED HEMODIALYSIS CATHETER MEDICATIONS: 10 mL 1 % lidocaine COMPLICATIONS: None immediate. PROCEDURE: Informed verbal consent was obtained from the patient following an explanation of the procedure, risks, benefits and alternatives to treatment. A time out was performed prior to the initiation of the procedure. Sterile technique was utilized including mask, sterile gloves, sterile drape, and hand hygiene. Betadine was used to prep the patient's right neck, chest and existing catheter. 1% lidocaine was injected around the catheter and the subcutaneous tunnel. The catheter was dissected out using curved hemostats until the cuff was freed from the surrounding fibrous sheath. The catheter was removed intact. Hemostasis was obtained with manual compression. A dressing was placed. The patient tolerated the procedure well without immediate post procedural complication. IMPRESSION: Successful removal of tunneled dialysis catheter. Read by: Narda Rutherford, AGNP-BC Electronically Signed   By: Corrie Mckusick D.O.   On: 10/15/2021 18:23   CT IMAGE GUIDED DRAINAGE BY PERCUTANEOUS CATHETER  Result Date: 10/12/2021 INDICATION: History of multifocal infection, post percutaneous hepatic abscess drainage  catheter placement on 09/29/2021. Patient presents today for image guided placement of a perisplenic abscess drainage catheter for infection source control purposes. EXAM: ULTRASOUND AND CT-GUIDED PERISPLENIC ABSCESS DRAINAGE CATHETER PLACEMENT COMPARISON:  CT abdomen and pelvis-10/07/2021 MEDICATIONS: The [facility] and receiving intravenous antibiotics. The antibiotics were administered within an appropriate time frame prior to the initiation of the procedure. ANESTHESIA/SEDATION: Moderate (conscious) sedation was employed during this procedure as administered by the Interventional Radiology RN. A total of Versed 3 mg and Fentanyl 150 mcg was administered intravenously. Moderate Sedation Time: 25 minutes. The patient's level of consciousness and vital signs were monitored continuously by radiology nursing throughout the procedure under my direct supervision. CONTRAST:  None COMPLICATIONS: None immediate. PROCEDURE: RADIATION DOSE REDUCTION: This exam was performed according to the departmental dose-optimization program which includes automated exposure control, adjustment of the mA and/or kV according to patient size and/or use of iterative reconstruction technique. Informed written consent was obtained from the patient after a discussion of the risks, benefits and alternatives to treatment. The patient was placed supine on the CT gantry and a pre procedural CT was performed re-demonstrating the known abscess/fluid collection within the perisplenic space with dominant component measuring approximally 12.4 x 7.1 cm (image 42, series 2). The CT gantry table position was marked and the collection was identified sonographically. The procedure was planned. A timeout was performed prior to the initiation of the procedure. The skin overlying the left upper abdominal quadrant was prepped and draped in the usual sterile fashion. After the overlying soft tissues were anesthetized  1%  lidocaine with epinephrine, the perisplenic fluid collection was targeted with an 18 gauge trocar needle yielding a return of purulent fluid. A short Amplatz wire was coiled within the collection. Appropriate positioning was confirmed with a limited CT scan. The tract was serially dilated allowing placement of a 12 [place] all-purpose drainage catheter. Appropriate positioning was confirmed with a limited postprocedural CT scan. Approximally 350 ml of purulent fluid was aspirated. The tube was connected to a drainage bag and sutured in place. A dressing was placed. The patient tolerated the procedure well without immediate post procedural complication. IMPRESSION: Successful CT guided placement of a 55 French all purpose drain catheter into the perisplenic abscess with aspiration of 350 mL of purulent fluid. Samples were sent to the laboratory as requested by the ordering clinical team. Electronically Signed   By: Sandi Mariscal M.D.   On: 10/12/2021 11:59   CT ABDOMEN PELVIS W CONTRAST  Addendum Date: 10/11/2021   ADDENDUM REPORT: 10/11/2021 15:02 ADDENDUM: These results were called by Dr. Rosario Jacks by telephone on 10/09/2021 at 12:33 pm to provider Dr. Graylon Good (infectious disease), who verbally acknowledged these results. Electronically Signed   By: Ronney Asters M.D.   On: 10/11/2021 15:02   Addendum Date: 09/27/2021   ADDENDUM REPORT: 09/27/2021 23:49 ADDENDUM: These results were called by telephone at the time of interpretation on 09/27/2021 at 11:49 pm to provider Dr. Lucile Shutters, who verbally acknowledged these results. Electronically Signed   By: Ronney Asters M.D.   On: 09/27/2021 23:49   Result Date: 10/11/2021 CLINICAL DATA:  Sepsis, leukocytosis. EXAM: CT ABDOMEN AND PELVIS WITH CONTRAST TECHNIQUE: Multidetector CT imaging of the abdomen and pelvis was performed using the standard protocol following bolus administration of intravenous contrast. RADIATION DOSE REDUCTION: This exam was performed according to the  departmental dose-optimization program which includes automated exposure control, adjustment of the mA and/or kV according to patient size and/or use of iterative reconstruction technique. CONTRAST:  <See Chart> OMNIPAQUE IOHEXOL 300 MG/ML  SOLN COMPARISON:  CT abdomen and CT abdomen and pelvis 09/10/2021. FINDINGS: Lower chest: Minimal left basilar atelectasis. New small left pleural effusion. Cardiomegaly. Hepatobiliary: There is a new hypodense lesion in the left lobe of the liver peripherally measuring 5.1 x 5.7 x 6.1 cm. Portion of this abuts the lesser curvature of the stomach. Gallbladder and bile ducts are within normal limits. Pancreas: Unremarkable. No pancreatic ductal dilatation or surrounding inflammatory changes. Spleen: Again seen are low attenuation areas throughout the spleen. These have increased in size. The largest area is now more oval in shape measuring 5.9 x 10.0 by 8.9 cm. This is subcapsular in location. Adrenals/Urinary Tract: Bilateral adrenal glands appear within normal limits scratch fat. Bilateral renal atrophy is again seen. Bilateral perinephric stranding and fluid has increased and is now tracking along the retroperitoneum. No hydronephrosis. Bladder is decompressed. Stomach/Bowel: Stomach is within normal limits. No evidence of bowel wall thickening, distention, or inflammatory changes. Appendix is not seen. Vascular/Lymphatic: Aortic atherosclerosis. No enlarged abdominal or pelvic lymph nodes. Reproductive: Uterus and bilateral adnexa are unremarkable. Other: No abdominal wall hernia or abnormality. No abdominopelvic ascites. Musculoskeletal: No acute or significant osseous findings. IMPRESSION: 1. There is a new 5.1 x 5.7 x 6.1 cm hypodense area in the subcapsular left lobe of the liver. Findings are concerning for abscess given the clinical history. 2. 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  Findings may represent subcapsular  hematoma given recent infarcts, but splenic abscess is also in the differential given findings in the liver. 3. Bilateral renal atrophy. Increasing perinephric stranding and fluid may represent infection. 4. New small left pleural effusion. 5. Aortic Atherosclerosis (ICD10-I70.0). Electronically Signed: By: Ronney Asters M.D. On: 09/27/2021 23:27   CT ABDOMEN PELVIS W CONTRAST  Result Date: 10/07/2021 CLINICAL DATA:  Abdominal pain, post-op Admitted with hepatic and splenic abscess, abdominal drain, splenic infarct, bacteremia, persistent leukocytosis, assess for interval improvement. Also has capsular hematoma noted on previous CT, assess for interval resolution EXAM: CT ABDOMEN AND PELVIS WITH CONTRAST TECHNIQUE: Multidetector CT imaging of the abdomen and pelvis was performed using the standard protocol following bolus administration of intravenous contrast. RADIATION DOSE REDUCTION: This exam was performed according to the departmental dose-optimization program which includes automated exposure control, adjustment of the mA and/or kV according to patient size and/or use of iterative reconstruction technique. CONTRAST:  115mL OMNIPAQUE IOHEXOL 300 MG/ML  SOLN COMPARISON:  09/27/2021 FINDINGS: Lower chest: Small-moderate-sized loculated left pleural effusion with associated compressive atelectasis. Cardiomegaly with coronary artery atherosclerosis. No pericardial effusion. Hepatobiliary: Percutaneous pigtail drainage catheter within the left hepatic lobe with near complete resolution of previously seen hepatic fluid collection/abscess. Minimal fluid attenuation surrounds the pigtail. No new intrahepatic collection. Unremarkable gallbladder. Pancreas: Unremarkable. No pancreatic ductal dilatation or surrounding inflammatory changes. Spleen: Large bilobed subcapsular fluid collection within the spleen measures approximately 10.1 x 6.6 x 10.7 cm (previously measured approximately  10.0 x 5.9 x 8.9 cm). Adrenals/Urinary Tract: Adrenal glands within normal limits. Severe bilateral renal atrophy. No renal stone or hydronephrosis. Perinephric stranding and fluid associated with both kidneys is increased from prior. No organized perinephric fluid collection. Urinary bladder is decompressed. Stomach/Bowel: Stomach is within normal limits. Appendix appears normal. No evidence of bowel wall thickening, distention, or inflammatory changes. Vascular/Lymphatic: Aortic atherosclerosis. No enlarged abdominal or pelvic lymph nodes. Reproductive: Uterus and bilateral adnexa are unremarkable. Other: No pneumoperitoneum. Small volume perihepatic ascites, slightly increased. Musculoskeletal: No new or acute bony findings. IMPRESSION: 1. Percutaneous pigtail drainage catheter within the left hepatic lobe with near complete resolution of previously seen hepatic fluid collection/abscess. 2. Large bilobed subcapsular fluid collection within the spleen measures up to 10.7 cm (previously measured up to 10.0 cm). 3. Small volume perihepatic ascites, slightly increased from prior. 4. Small-moderate-sized loculated left pleural effusion with associated compressive atelectasis. 5. Severe bilateral renal atrophy. Perinephric stranding and fluid associated with both kidneys is increased from prior. No organized perinephric fluid collection. Aortic Atherosclerosis (ICD10-I70.0). Electronically Signed   By: Davina Poke D.O.   On: 10/07/2021 12:11   DG CHEST PORT 1 VIEW  Result Date: 10/04/2021 CLINICAL DATA:  Pulmonary edema.  ESRD. EXAM: PORTABLE CHEST 1 VIEW COMPARISON:  September 15, 2021. FINDINGS: Moderate left pleural effusion. Overlying left basilar opacities. No overt pulmonary edema. No visible pneumothorax. Enlarged cardiac silhouette. Pulmonary vascular congestion. Similar position of right and left IJ central venous catheters. IMPRESSION: 1. Moderate left pleural effusion with overlying left basilar  atelectasis and/or consolidation. 2. Cardiomegaly and pulmonary vascular congestion. No overt pulmonary edema. Electronically Signed   By: Margaretha Sheffield M.D.   On: 10/04/2021 09:20   CT IMAGE GUIDED DRAINAGE BY PERCUTANEOUS CATHETER  Result Date: 09/29/2021 CLINICAL DATA:  Hepatic abscess. EXAM: CT GUIDED CATHETER DRAINAGE OF HEPATIC ABSCESS ANESTHESIA/SEDATION: Moderate (conscious) sedation was employed during this procedure. A total of Versed 1.5 mg and Fentanyl 100 mcg was administered intravenously by radiology nurse. Moderate Sedation Time: 10 minutes.  The patient's level of consciousness and vital signs were monitored continuously by radiology nursing throughout the procedure under my direct supervision. PROCEDURE: The procedure, risks, benefits, and alternatives were explained to the patient. Questions regarding the procedure were encouraged and answered. The patient understands and consents to the procedure. A time out was performed prior to initiating the procedure. CT was performed through the abdomen in a supine position. The anterior abdominal wall was prepped with chlorhexidine in a sterile fashion, and a sterile drape was applied covering the operative field. A sterile gown and sterile gloves were used for the procedure. Local anesthesia was provided with 1% Lidocaine. Under CT guidance, an 18 gauge trocar needle was advanced into an abscess in the left lobe of the liver. After confirming needle tip position and aspiration of a small amount of fluid, a guidewire was advanced into the collection, the tract was dilated over the wire and a 10 French percutaneous pigtail drainage catheter advanced and formed. Drain position was confirmed by CT. A larger fluid sample was then withdrawn from the drain and sent for culture analysis. The drain was flushed with sterile saline and connected to a suction bulb. The drainage catheter was secured at the skin with a Prolene retention suture and overlying  sterile dressing. RADIATION DOSE REDUCTION: This exam was performed according to the departmental dose-optimization program which includes automated exposure control, adjustment of the mA and/or kV according to patient size and/or use of iterative reconstruction technique. COMPLICATIONS: None FINDINGS: CT again demonstrates an ill-defined ovoid collection within the left lobe of the liver measuring approximately 6 cm in maximum diameter. After needle puncture, there was return of grossly purulent fluid. After placement of the drain, there is abundant return of purulent fluid. The communicating splenic collections appear similar by CT and may also represent abscesses. IMPRESSION: 1. CT-guided percutaneous catheter drainage of left lobe hepatic abscess yielding purulent fluid. A fluid sample was sent for culture analysis. A 10 French drainage catheter was placed and attached to suction bulb drainage. 2. Rounded communicating splenic collections are similar in appearance to the hepatic collection and are suspicious for splenic abscesses. Electronically Signed   By: Aletta Edouard M.D.   On: 09/29/2021 13:17   CT HEAD WO CONTRAST (5MM)  Result Date: 09/28/2021 CLINICAL DATA:  Initial evaluation for TIA, weakness. EXAM: CT HEAD WITHOUT CONTRAST TECHNIQUE: Contiguous axial images were obtained from the base of the skull through the vertex without intravenous contrast. RADIATION DOSE REDUCTION: This exam was performed according to the departmental dose-optimization program which includes automated exposure control, adjustment of the mA and/or kV according to patient size and/or use of iterative reconstruction technique. COMPARISON:  None Available. FINDINGS: Brain: Cerebral volume within normal limits. No acute intracranial hemorrhage. No visible acute large vessel territory infarct. No mass lesion, midline shift or mass effect. No hydrocephalus or extra-axial fluid collection. Vascular: No hyperdense vessel. Focal  calcification noted at the level of the right sylvian fissure, possibly an embolic calcification. Skull: Scalp soft tissues and calvarium within normal limits. Sinuses/Orbits: Globes orbital soft tissues within normal limits. Moderate left maxillary sinusitis noted. Moderate bilateral mastoid effusions noted as well. Other: None. IMPRESSION: 1. No acute intracranial abnormality. 2. Moderate left maxillary sinusitis. 3. Moderate bilateral mastoid effusions. Electronically Signed   By: Jeannine Boga M.D.   On: 09/28/2021 00:54   ECHO TEE  Result Date: 09/27/2021    TRANSESOPHOGEAL ECHO REPORT   Patient Name:   Donnalee Curry Date of Exam: 09/27/2021 Medical  Rec #:  [id number]       Height:       65.0 in Accession #:    [id number]      Weight:       339.9 lb Date of Birth:  10-Oct-1970        BSA:          2.478 m Patient Age:    86 years        BP:           105/64 mmHg Patient Gender: F               HR:           107 bpm. Exam Location:  Inpatient Procedure: Transesophageal Echo, Color Doppler, Cardiac Doppler and 3D Echo Indications:     Bacteremia. Splenic infarct.  History:         Patient has prior history of Echocardiogram examinations, most                  recent 09/10/2021. ESRD; Risk Factors:Diabetes and Hypertension.  Sonographer:     Johny Chess RDCS Sonographer#2:   Eartha Inch Referring Phys:  Nancy Fetter DUNN Diagnosing Phys: Kirk Ruths MD PROCEDURE: After discussion of the risks and benefits of a TEE, an informed consent was obtained from the patient. The patient was intubated. The transesophogeal probe was passed without difficulty through the esophogus of the patient. Sedation performed  by different physician. The patient was monitored while under deep sedation. The patient developed no complications during the procedure. IMPRESSIONS  1. Dialysis catheter noted in SVC; calcified right atrial mass noted (etiology unclear; possible calcified thrombus); if pt can tolerate, cardiac  MRI could provide more definitive evaluation.  2. Left ventricular ejection fraction, by estimation, is 65 to 70%. The left ventricle has normal function. The left ventricle has no regional wall motion abnormalities.  3. Right ventricular systolic function is normal. The right ventricular size is normal.  4. Left atrial size was mildly dilated. No left atrial/left atrial appendage thrombus was detected.  5. Right atrial size was mildly dilated.  6. The mitral valve is normal in structure. Mild mitral valve regurgitation. Moderate mitral annular calcification.  7. Tricuspid valve regurgitation is moderate.  8. The aortic valve is tricuspid. Aortic valve regurgitation is not visualized. Aortic valve sclerosis is present, with no evidence of aortic valve stenosis.  9. There is mild (Grade II) plaque involving the descending aorta. 10. Evidence of atrial level shunting detected by color flow Doppler. Agitated saline contrast bubble study was positive with shunting observed within 3-6 cardiac cycles suggestive of interatrial shunt. FINDINGS  Left Ventricle: Left ventricular ejection fraction, by estimation, is 65 to 70%. The left ventricle has normal function. The left ventricle has no regional wall motion abnormalities. The left ventricular internal cavity size was normal in size. Right Ventricle: The right ventricular size is normal. Right ventricular systolic function is normal. Left Atrium: Left atrial size was mildly dilated. No left atrial/left atrial appendage thrombus was detected. Right Atrium: Right atrial size was mildly dilated. Pericardium: There is no evidence of pericardial effusion. Mitral Valve: The mitral valve is normal in structure. Moderate mitral annular calcification. Mild mitral valve regurgitation. Tricuspid Valve: The tricuspid valve is normal in structure. Tricuspid valve regurgitation is moderate. Aortic Valve: The aortic valve is tricuspid. Aortic valve regurgitation is not visualized. Aortic  valve sclerosis is present, with no evidence of aortic valve stenosis. Pulmonic Valve: The pulmonic valve  was normal in structure. Pulmonic valve regurgitation is trivial. Aorta: The aortic root is normal in size and structure. There is mild (Grade II) plaque involving the descending aorta. IAS/Shunts: The interatrial septum is aneurysmal. Evidence of atrial level shunting detected by color flow Doppler. Agitated saline contrast bubble study was positive with shunting observed within 3-6 cardiac cycles suggestive of interatrial shunt. Additional Comments: Dialysis catheter noted in SVC; calcified right atrial mass noted (etiology unclear; possible calcified thrombus); if pt can tolerate, cardiac MRI could provide more definitive evaluation.  TRICUSPID VALVE TR Peak grad:   54.5 mmHg TR Vmax:        369.00 cm/s Kirk Ruths MD Electronically signed by Kirk Ruths MD Signature Date/Time: 09/27/2021/2:25:38 PM    Final     Labs:  CBC: Recent Labs    10/12/21 0315 10/12/21 1700 10/14/21 0353 10/14/21 1540 10/15/21 0421 10/17/21 0222  WBC 20.7*  --  14.3*  --  15.8* 9.9  HGB 7.4*   < > 6.4* 9.0* 9.5* 7.5*  HCT 25.6*   < > 22.4* 29.5* 30.7* 23.4*  PLT 217  --  158  --  199 210   < > = values in this interval not displayed.    COAGS: Recent Labs    07/10/21 0627 09/10/21 0403 09/28/21 0151 09/30/21 0500 09/30/21 1049 10/01/21 0847 10/02/21 0621 10/09/21 1219  INR 1.3* 1.3*  --   --   --   --   --  1.3*  APTT  --   --    < > 85* 72* 69* 65*  --    < > = values in this interval not displayed.    BMP: Recent Labs    10/11/21 0503 10/12/21 0315 10/15/21 0421 10/17/21 0222  NA 134* 133* 130* 127*  K 4.0 4.0 3.8 4.7  CL 96* 96* 94* 93*  CO2 28 24 24 26   GLUCOSE 160* 120* 91 119*  BUN 20 28* 32* 29*  CALCIUM 7.8* 7.3* 7.6* 8.2*  CREATININE 4.33* 5.25* 6.31* 3.75*  GFRNONAA 12* 9* 7* 14*    LIVER FUNCTION TESTS: Recent Labs    09/03/21 2040 09/10/21 0031 09/10/21 0920  09/11/21 0324 09/14/21 0636 09/19/21 0915 10/11/21 0503 10/12/21 0315 10/15/21 0421 10/17/21 0222  BILITOT 0.4 0.7  --  0.8 0.7  --   --   --   --   --   AST 23 18  --  30 16  --   --   --   --   --   ALT 16 11  --  11 10  --   --   --   --   --   ALKPHOS 77 79  --  76 124  --   --   --   --   --   PROT 7.7 7.8  --  7.1 6.0*  --   --   --   --   --   ALBUMIN 3.0* 2.8*   < > 2.4* 1.9*  1.9*   < > 1.9* 2.0* 2.0* 2.2*   < > = values in this interval not displayed.    TUMOR MARKERS: No results for input(s): "AFPTM", "CEA", "CA199", "CHROMGRNA" in the last 8760 hours.  Assessment and Plan: 51 y.o. 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 Patient's L EJ TDC was removed on 7/11 per request from nephrology/ID. She is in need of new tunneled dialysis catheter.   Reached out to Dr. Candiss Norse as patient has pending blood cx which was obtained on 7/12, OK to proceed tomorrow.   CBC today with no leukocytosis, pt afebrile.  The procedure is scheduled for tomorrow.  NPO at [facility] Vanc 1 g to be given in IR   Risks and benefits discussed with the patient including, but not limited to bleeding, infection, vascular injury, pneumothorax which may require chest tube placement, air embolism or even death  All of the patient's questions were answered, patient is agreeable to proceed. Consent signed and in chart.  LUQ drain was also evaluated during visit, functioning well, site c/d/I. App 100 mL SS fluid in the gravity bag. Cont to flush with 5 mL TID, measure and document q shift.   Thank you for this interesting consult.  I greatly enjoyed meeting Tonesha Tsou and look forward to participating in their care.  A copy of this report was sent to the requesting provider on this  date.  Electronically Signed: Tera Mater, PA-C 10/17/2021, 4:33 PM   I spent a total of 20 Minutes    in face to face in clinical consultation, greater than 50% of which was counseling/coordinating care for [facility] placement.   This chart was dictated using voice recognition software.  Despite best efforts to proofread,  errors can occur which can change the documentation meaning.

## 2021-10-17 NOTE — Progress Notes (Signed)
PROGRESS NOTE    Courtney Gray  YQM:[id number] DOB: 13-Apr-1970 DOA: 09/09/2021 PCP: Monico Blitz, MD     Brief Narrative:  Courtney Gray is a 51 year old female with end-stage renal disease, chronic HFpEF with grade 2 diastolic dysfunction, insulin requiring diabetes mellitus, morbid obesity chronically on 2 L of oxygen, peripheral vascular disease status post right AKA on 5/10 for critical limb ischemia. The patient presented to the ED on 09/09/2021 with severe left upper quadrant pain and CT revealed a splenic infarct.  She was started on IV heparin and briefly required pressors for shock.  6/17 the patient grew out Bacteroides Thetaiotaomicron in anaerobic bottle of one set 6/19 amputation of the third digit on the left hand for gangrene 6/23 TEE was negative for endocarditis-calcified mass noted in right atrium of unclear etiology-CT revealed liver abscess and subcapsular splenic hematoma-Eliquis held 6/25 CT-guided drainage of liver abscess-culture negative- drain removed on 7/3 7/5-left AKA for left first and second gangrene 7/7-drain placed into splenic abscess with 350 cc of pus- culture NGTD 7/11--line holiday, TDC removed   New events last 24 hours / Subjective: Patient states that last night after taking her pills, she started to have some chest pain.  This morning not having any chest pain.  Unsure which one of the pills last night caused her to have a chest pain.  Last night around 10:20 PM Lipitor, Neurontin, multivitamin, Protonix, Oxycodone   Assessment & Plan:   Principal Problem:   Splenic infarction Active Problems:   Lactic acidosis   Elevated troponin level not due myocardial infarction   Chronic respiratory failure with hypoxia (HCC)   ESRD (end stage renal disease) on dialysis ([place])   Uncontrolled type 2 diabetes mellitus with hypoglycemia, with long-term current use of insulin (HCC)   Essential hypertension   Mixed diabetic hyperlipidemia associated with type  2 diabetes mellitus (HCC)   Leukocytosis   Dry gangrene (HCC)   Hypotension   PVD (peripheral vascular disease) (HCC)   Hx of AKA (above knee amputation), right (HCC)   Pressure injury of skin   Hepatic abscess   Splenic infarction, thought to be secondary to septic embolus -Heparin was discontinued as splenic infarction was thought to be secondary to septic embolus and not a blood clot  Septic shock secondary to bacteremia, Bacteroides thetaiotaomicron -Appreciate infectious disease.  Receiving cefepime with dialysis + PO flagyl, end date 11/08/2021 -Tunneled dialysis catheter and left TL catheter removed removed 7/11 in setting of persistent leukocytosis to give line holiday -Leukocytosis resolved -Replace tunneled dialysis catheter 7/14  Hepatic and splenic abscesses -Abscess culture showing Staph epidermidis, pansensitive -Continue to monitor drain output from left upper quadrant splenic drain  Anemia of chronic disease -Received 1 unit packed red blood cells 7/10  ESRD on dialysis TTSa -Nephrology following  Uncontrolled type 2 diabetes mellitus with hypoglycemia -A1c 5.7 -Sliding scale insulin discontinued as patient has been refusing it  HTN -Norvasc, coreg   HLD -Lipitor   PAD -Status post right AKA -Status post left AKA for gangrene.  Wound VAC removed.  Vascular surgery following.  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 pink (Active)  09/11/21 0000  Location: Buttocks  Location  Orientation: Left  Staging: Stage 2 -  Partial thickness loss of dermis presenting as a shallow open injury with a red, pink wound bed without slough.  Wound Description (Comments): pink  Present on Admission: Yes  Dressing Type Foam - Lift dressing to assess site every shift 10/17/21 0830     Pressure Injury 09/11/21 Buttocks Right Stage 2 -  Partial thickness loss of dermis presenting as a shallow open injury with a red, pink wound bed without slough. pink (Active)  09/11/21 0000  Location: Buttocks  Location Orientation: Right  Staging: Stage 2 -  Partial thickness loss of dermis presenting as a shallow open injury with a red, pink wound bed without slough.  Wound Description (Comments): pink  Present on Admission: Yes  Dressing Type Foam - Lift dressing to assess site every shift 10/16/21 2200     Nutrition Problem: Inadequate oral intake Etiology: nausea   DVT prophylaxis:  heparin injection 5,000 Units Start: 10/15/21 1600 SCD's Start: 10/09/21 1713  Code Status: Full Family Communication: None at bedside  Disposition Plan:  Status is: Inpatient Remains inpatient appropriate because: Tunneled dialysis catheter to be replaced tomorrow followed by dialysis.  SNF placement, they can take her Monday   Antimicrobials:  Anti-infectives (From admission, onward)    Start     Dose/Rate Route Frequency Ordered Stop   10/16/21 2200  metroNIDAZOLE (FLAGYL) tablet 500 mg        500 mg Oral Every 12 hours 10/16/21 1128 11/09/21 0959   09/25/21 0915  metroNIDAZOLE (FLAGYL) IVPB 500 mg  Status:  Discontinued        500 mg 100 mL/hr over 60 Minutes Intravenous Every 12 hours 09/25/21 0822 10/16/21 1128   09/13/21 1000  vancomycin (VANCOCIN) IVPB 1000 mg/200 mL premix        1,000 mg 200 mL/hr over 60 Minutes Intravenous STAT 09/13/21 0904 09/13/21 1140   09/10/21 1200  vancomycin (VANCOCIN) IVPB 1000 mg/200 mL premix  Status:  Discontinued        1,000 mg 200 mL/hr over 60 Minutes  Intravenous Every T-Th-Sa (Hemodialysis) 09/10/21 0620 09/17/21 1013   09/10/21 1200  ceFEPIme (MAXIPIME) 2 g in sodium chloride 0.9 % 100 mL IVPB        2 g 200 mL/hr over 30 Minutes Intravenous Every T-Th-Sa (Hemodialysis) 09/10/21 1107 11/08/21 2359   09/10/21 0445  vancomycin (VANCOREADY) IVPB 2000 mg/400 mL        2,000 mg 200 mL/hr over 120 Minutes Intravenous  Once 09/10/21 0435 09/10/21 0800   09/10/21 0430  ceFEPIme (MAXIPIME) 2 g in sodium chloride 0.9 % 100 mL IVPB        2 g 200 mL/hr over 30 Minutes Intravenous  Once 09/10/21 0420 09/10/21 0550        Objective: Vitals:   10/17/21 0500 10/17/21 0600 10/17/21 0613 10/17/21 0923  BP:   135/62 (!) 131/53  Pulse:   80 81  Resp:   18 17  Temp:  99 F (37.2 C)  98.3 F (36.8 C)  TempSrc:      SpO2:   100% 97%  Weight: (!) 149.3 kg     Height:        Intake/Output Summary (Last 24 hours) at 10/17/2021 1314 Last data filed at 10/17/2021 1211 Gross per 24 hour  Intake 328 ml  Output 0 ml  Net 328 ml    Filed Weights   10/15/21 0851 10/15/21 1254 10/17/21 0500  Weight: (!) 147.9 kg (!) 144.8 kg (!) 149.3 kg    Examination:  General exam: Appears calm and comfortable, morbidly obese Respiratory system: Clear to auscultation. Respiratory effort normal. No respiratory distress. No conversational dyspnea.  On room air Cardiovascular system: S1 & S2 heard, RRR. No murmurs.  Gastrointestinal system: Abdomen is nondistended, soft and nontender. Normal bowel sounds heard. Central nervous system: Alert and oriented. Extremities: Bilateral lower extremity AKA, left middle finger amputation Psychiatry: Judgement and insight appear normal. Mood & affect appropriate.   Data Reviewed: I have personally reviewed following labs and imaging studies  CBC: Recent Labs  Lab 10/11/21 0503 10/11/21 2031 10/12/21 0315 10/12/21 1700 10/13/21 0504 10/14/21 0353 10/14/21 1540 10/15/21 0421 10/17/21 0222  WBC 30.3*  --  20.7*   --   --  14.3*  --  15.8* 9.9  NEUTROABS  --   --   --   --   --   --   --   --  5.9  HGB 6.7*   < > 7.4*   < > 7.5* 6.4* 9.0* 9.5* 7.5*  HCT 23.1*   < > 25.6*   < > 25.1* 22.4* 29.5* 30.7* 23.4*  MCV 98.3  --  97.7  --   --  99.6  --  93.6 94.0  PLT 242  --  217  --   --  158  --  199 210   < > = values in this interval not displayed.    Basic Metabolic Panel: Recent Labs  Lab 10/11/21 0503 10/12/21 0315 10/15/21 0421 10/17/21 0222  NA 134* 133* 130* 127*  K 4.0 4.0 3.8 4.7  CL 96* 96* 94* 93*  CO2 28 24 24 26   GLUCOSE 160* 120* 91 119*  BUN 20 28* 32* 29*  CREATININE 4.33* 5.25* 6.31* 3.75*  CALCIUM 7.8* 7.3* 7.6* 8.2*  PHOS 4.6 5.1* 5.7* 3.8    GFR: Estimated Creatinine Clearance: 26.3 mL/min (A) (by C-G formula based on SCr of 3.75 mg/dL (H)). Liver Function Tests: Recent Labs  Lab 10/11/21 0503 10/12/21 0315 10/15/21 0421 10/17/21 0222  ALBUMIN 1.9* 2.0* 2.0* 2.2*    No results for input(s): "LIPASE", "AMYLASE" in the last 168 hours. No results for input(s): "AMMONIA" in the last 168 hours. Coagulation Profile: No results for input(s): "INR", "PROTIME" in the last 168 hours.  Cardiac Enzymes: No results for input(s): "CKTOTAL", "CKMB", "CKMBINDEX", "TROPONINI" in the last 168 hours. BNP (last 3 results) No results for input(s): "PROBNP" in the last 8760 hours. HbA1C: No results for input(s): "HGBA1C" in the last 72 hours. CBG: Recent Labs  Lab 10/16/21 1151 10/16/21 1600 10/16/21 2119 10/17/21 0719 10/17/21 1137  GLUCAP 92 166* 119* 81 110*    Lipid Profile: No results for input(s): "CHOL", "HDL", "LDLCALC", "TRIG", "CHOLHDL", "LDLDIRECT" in the last 72 hours. Thyroid Function Tests: No results for input(s): "TSH", "T4TOTAL", "FREET4", "T3FREE", "THYROIDAB" in the last 72 hours. Anemia Panel: No results for input(s): "VITAMINB12", "FOLATE", "FERRITIN", "TIBC", "IRON", "RETICCTPCT" in the last 72 hours. Sepsis Labs: No results for input(s):  "PROCALCITON", "LATICACIDVEN" in the last 168 hours.  Recent Results (from the past 240 hour(s))  Aerobic/Anaerobic Culture w Gram Stain (surgical/deep wound)     Status: None   Collection Time: 10/11/21  5:00 PM   Specimen: Abscess  Result Value Ref Range  Status   Specimen Description ABSCESS  Final   Special Requests SPLEEN  Final   Gram Stain   Final    ABUNDANT WBC PRESENT,BOTH PMN AND MONONUCLEAR NO ORGANISMS SEEN    Culture   Final    RARE STAPHYLOCOCCUS EPIDERMIDIS NO ANAEROBES ISOLATED Performed at [facility], [address] [place], [place] [address]    Report Status 10/16/2021 FINAL  Final   Organism ID, Bacteria STAPHYLOCOCCUS EPIDERMIDIS  Final      Susceptibility   Staphylococcus epidermidis - MIC*    CIPROFLOXACIN <=0.5 SENSITIVE Sensitive     ERYTHROMYCIN <=0.25 SENSITIVE Sensitive     GENTAMICIN <=0.5 SENSITIVE Sensitive     OXACILLIN <=0.25 SENSITIVE Sensitive     TETRACYCLINE <=1 SENSITIVE Sensitive     VANCOMYCIN 1 SENSITIVE Sensitive     TRIMETH/SULFA <=10 SENSITIVE Sensitive     CLINDAMYCIN <=0.25 SENSITIVE Sensitive     RIFAMPIN <=0.5 SENSITIVE Sensitive     Inducible Clindamycin NEGATIVE Sensitive     * RARE STAPHYLOCOCCUS EPIDERMIDIS  Culture, blood (Routine X 2) w Reflex to ID Panel     Status: None (Preliminary result)   Collection Time: 10/16/21  3:13 PM   Specimen: BLOOD RIGHT HAND  Result Value Ref Range Status   Specimen Description BLOOD RIGHT HAND  Final   Special Requests   Final    BOTTLES DRAWN AEROBIC AND ANAEROBIC Blood Culture results may not be optimal due to an inadequate volume of blood received in culture bottles   Culture   Final    NO GROWTH < 24 HOURS Performed at [facility], [place] [address] [place], [place] [address]    Report Status PENDING  Incomplete  Culture, blood (Routine X 2) w Reflex to ID Panel     Status: None (Preliminary result)   Collection Time: 10/16/21  3:34 PM   Specimen: BLOOD  Result  Value Ref Range Status   Specimen Description BLOOD LEFT ANTECUBITAL  Final   Special Requests   Final    BOTTLES DRAWN AEROBIC AND ANAEROBIC Blood Culture results may not be optimal due to an inadequate volume of blood received in culture bottles   Culture   Final    NO GROWTH < 24 HOURS Performed at [facility], [place] [address], [place], [place] [address]    Report Status PENDING  Incomplete      Radiology Studies: IR Removal Tun Cv Cath W/O FL  Result Date: 10/15/2021 INDICATION: Request for tunneled dialysis catheter removal due to infection with replacement in 48 hours. EXAM: REMOVAL OF TUNNELED HEMODIALYSIS CATHETER MEDICATIONS: 10 mL 1 % lidocaine COMPLICATIONS: None immediate. PROCEDURE: Informed verbal consent was obtained from the patient following an explanation of the procedure, risks, benefits and alternatives to treatment. A time out was performed prior to the initiation of the procedure. Sterile technique was utilized including mask, sterile gloves, sterile drape, and hand hygiene. Betadine was used to prep the patient's right neck, chest and existing catheter. 1% lidocaine was injected around the catheter and the subcutaneous tunnel. The catheter was dissected out using curved hemostats until the cuff was freed from the surrounding fibrous sheath. The catheter was removed intact. Hemostasis was obtained with manual compression. A dressing was placed. The patient tolerated the procedure well without immediate post procedural complication. IMPRESSION: Successful removal of tunneled dialysis catheter. Read by: Narda Rutherford, AGNP-BC Electronically Signed   By: Corrie Mckusick D.O.   On: 10/15/2021 18:23  Scheduled Meds:  amLODipine  10 mg Oral Daily   atorvastatin  10 mg Oral QHS   calcitRIOL  1 mcg Oral Q T,Th,Sa-HD   carvedilol  3.125 mg Oral BID WC   cinacalcet  30 mg Oral Q T,Th,Sa-HD   darbepoetin (ARANESP) injection - DIALYSIS  200 mcg Intravenous Q Sat-HD    docusate sodium  100 mg Oral Daily   feeding supplement  1 Container Oral TID BM   gabapentin  100 mg Oral Q12H   gabapentin  200 mg Oral Q T,Th,Sa-HD   Gerhardt's butt cream   Topical BID   heparin injection (subcutaneous)  5,000 Units Subcutaneous Q8H   metoCLOPramide (REGLAN) injection  5 mg Intravenous Q6H   metroNIDAZOLE  500 mg Oral Q12H   multivitamin  1 tablet Oral QHS   pantoprazole  40 mg Oral BID   polyethylene glycol  17 g Oral BID   sodium chloride flush  3 mL Intravenous Q12H   sodium chloride flush  5 mL Intracatheter Q8H   Continuous Infusions:  sodium chloride 10 mL/hr at 10/09/21 1410   ceFEPime (MAXIPIME) IV 2 g (10/15/21 1618)   magnesium sulfate bolus IVPB       LOS: 37 days     Dessa Phi, DO [facility] 10/17/2021, 1:14 PM   Available via Epic secure chat 7am-7pm After these hours, please refer to coverage provider listed on [email or web address]

## 2021-10-17 NOTE — Progress Notes (Addendum)
[facility] Progress Note   Subjective:    Seen and examined patient on bedside. No acute issues. On line holiday. Denies SOB, CP, and N/V. Plan for new line placement tomorrow. HD will be after new line placement.   Objective Vitals:   10/17/21 0500 10/17/21 0600 10/17/21 0613 10/17/21 0923  BP:   135/62 (!) 131/53  Pulse:   80 81  Resp:   18 17  Temp:  99 F (37.2 C)  98.3 F (36.8 C)  TempSrc:      SpO2:   100% 97%  Weight: (!) 149.3 kg     Height:       Physical Exam General: Ill-appearing; obese; on 3L [place]: S1 and S2; No murmurs, gallops, or rubs Lungs: Breathing unlabored. Clear throughout; No wheezing, rales, or rhonchi Abdomen: Obese, soft Extremities: R AKA; L AKA-wound vac removed today by [facility] CDI; L hand with ace wrap in place Dialysis Access: [facility] and L TL catheter removed 7/11 for line holiday  Filed Weights   10/15/21 0851 10/15/21 1254 10/17/21 0500  Weight: (!) 147.9 kg (!) 144.8 kg (!) 149.3 kg    Intake/Output Summary (Last 24 hours) at 10/17/2021 1020 Last data filed at 10/17/2021 0830 Gross per 24 hour  Intake 325 ml  Output 0 ml  Net 325 ml    Additional Objective Labs: Basic Metabolic Panel: Recent Labs  Lab 10/12/21 0315 10/15/21 0421 10/17/21 0222  NA 133* 130* 127*  K 4.0 3.8 4.7  CL 96* 94* 93*  CO2 24 24 26   GLUCOSE 120* 91 119*  BUN 28* 32* 29*  CREATININE 5.25* 6.31* 3.75*  CALCIUM 7.3* 7.6* 8.2*  PHOS 5.1* 5.7* 3.8   Liver Function Tests: Recent Labs  Lab 10/12/21 0315 10/15/21 0421 10/17/21 0222  ALBUMIN 2.0* 2.0* 2.2*   No results for input(s): "LIPASE", "AMYLASE" in the last 168 hours. CBC: Recent Labs  Lab 10/11/21 0503 10/11/21 2031 10/12/21 0315 10/12/21 1700 10/14/21 0353 10/14/21 1540 10/15/21 0421 10/17/21 0222  WBC 30.3*  --  20.7*  --  14.3*  --  15.8* 9.9  NEUTROABS  --   --   --   --   --   --   --  5.9  HGB 6.7*   < > 7.4*   < > 6.4* 9.0* 9.5* 7.5*  HCT 23.1*   < >  25.6*   < > 22.4* 29.5* 30.7* 23.4*  MCV 98.3  --  97.7  --  99.6  --  93.6 94.0  PLT 242  --  217  --  158  --  199 210   < > = values in this interval not displayed.   Blood Culture    Component Value Date/Time   SDES BLOOD LEFT ANTECUBITAL 10/16/2021 1534   SPECREQUEST  10/16/2021 1534    BOTTLES DRAWN AEROBIC AND ANAEROBIC Blood Culture results may not be optimal due to an inadequate volume of blood received in culture bottles   CULT  10/16/2021 1534    NO GROWTH < 24 HOURS Performed at [facility], [place] [address], [place], [place] [address]    REPTSTATUS PENDING 10/16/2021 1534    Cardiac Enzymes: No results for input(s): "CKTOTAL", "CKMB", "CKMBINDEX", "TROPONINI" in the last 168 hours. CBG: Recent Labs  Lab 10/16/21 0739 10/16/21 1151 10/16/21 1600 10/16/21 2119 10/17/21 0719  GLUCAP 87 92 166* 119* 81   Iron Studies: No results for input(s): "IRON", "TIBC", "TRANSFERRIN", "FERRITIN" in  the last 72 hours. Lab Results  Component Value Date   INR 1.3 (H) 10/09/2021   INR 1.3 (H) 09/10/2021   INR 1.3 (H) 07/10/2021   Studies/Results: IR Removal Tun Cv Cath W/O FL  Result Date: 10/15/2021 INDICATION: Request for tunneled dialysis catheter removal due to infection with replacement in 48 hours. EXAM: REMOVAL OF TUNNELED HEMODIALYSIS CATHETER MEDICATIONS: 10 mL 1 % lidocaine COMPLICATIONS: None immediate. PROCEDURE: Informed verbal consent was obtained from the patient following an explanation of the procedure, risks, benefits and alternatives to treatment. A time out was performed prior to the initiation of the procedure. Sterile technique was utilized including mask, sterile gloves, sterile drape, and hand hygiene. Betadine was used to prep the patient's right neck, chest and existing catheter. 1% lidocaine was injected around the catheter and the subcutaneous tunnel. The catheter was dissected out using curved hemostats until the cuff was freed from the surrounding  fibrous sheath. The catheter was removed intact. Hemostasis was obtained with manual compression. A dressing was placed. The patient tolerated the procedure well without immediate post procedural complication. IMPRESSION: Successful removal of tunneled dialysis catheter. Read by: Narda Rutherford, AGNP-BC Electronically Signed   By: Corrie Mckusick D.O.   On: 10/15/2021 18:23    Medications:  sodium chloride 10 mL/hr at 10/09/21 1410   ceFEPime (MAXIPIME) IV 2 g (10/15/21 1618)   magnesium sulfate bolus IVPB      amLODipine  10 mg Oral Daily   atorvastatin  10 mg Oral QHS   calcitRIOL  1 mcg Oral Q T,Th,Sa-HD   carvedilol  3.125 mg Oral BID WC   cinacalcet  30 mg Oral Q T,Th,Sa-HD   darbepoetin (ARANESP) injection - DIALYSIS  200 mcg Intravenous Q Sat-HD   docusate sodium  100 mg Oral Daily   feeding supplement  1 Container Oral TID BM   gabapentin  100 mg Oral Q12H   gabapentin  200 mg Oral Q T,Th,Sa-HD   Gerhardt's butt cream   Topical BID   heparin injection (subcutaneous)  5,000 Units Subcutaneous Q8H   metoCLOPramide (REGLAN) injection  5 mg Intravenous Q6H   metroNIDAZOLE  500 mg Oral Q12H   multivitamin  1 tablet Oral QHS   pantoprazole  40 mg Oral BID   polyethylene glycol  17 g Oral BID   sodium chloride flush  3 mL Intravenous Q12H   sodium chloride flush  5 mL Intracatheter Q8H    Dialysis Orders: [name] TTS 4h 21min, BFR 400, EDW 151 kg, 1K/2.5Ca bath, TDC Hep 2,600 load and 1,500 hourly-Total dose 9275 units per treatment.  - mircera 200ug q2 due 09/10/21 - calcitriol 1 ug tiw - sensipar 30 mg tiw  Assessment/Plan: Septic shock: On admit requiring short-term pressors and again after TEE. Found to have bacteremia with bacteroides thetaiotaomicron and liver abscess and concern for splenic abscess. Appreciate ID -s/p TDC (IR) and L TL (IV team) catheter removal 7/11 for line holiday. Blood cultures obtained yesterday and so far (-). Plan for new line placement in IR  tomorrow 10/18/21. Remains on course of Cefepime with HD and oral metronidazole.  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 Splenic/Hepatic abscesses: S/p drain placed in IR 09/29/2021.  ESRD: Continue HD per usual TTS schedule-received HD 7/11 and now on line holiday for 48-72 hours. Volume status and  labs remain stable. Plan for HD tomorrow 7/14 after new line placement. Continue 4 hour treatments instead of 4 hr 42min for now. Will raise time back up if labs worsen and/or unable to remove adequate amounts of fluid. No heparin with HD. Chronic hypoxic resp failure - At baseline, she is on 3L O2 at home. HTN/volume:  Optimize UF with HD. She has now reached her old EDW but need to lower further. CXR repeated still shows vascular congestion.  Anemia of ESRD: S/p 6 units PRBCs during this hospitalization. Hgb trending back down-now 7.5. Current ESA schedule off d/t line holiday. Plan to give 1x SQ dose tomorrow to remain on schedule. Continue Aranesp 200 mcg. Continue to transfuse for Hgb after 7.     Secondary Hyperparathyroidism: CorrCa/Phos ok, requesting to hold binder due to constipation but resume if phos trends back up. Continue VDRA + sensipar.   T2DM- per primary team Malnutrition in setting of Morbid Obesity - Albumin very low. Continue protein supplements.   Tobie Poet, NP [facility] 10/17/2021,10:20 AM  LOS: 37 days

## 2021-10-17 NOTE — Progress Notes (Signed)
  Progress Note    10/17/2021 7:47 AM 8 Days Post-Op  Subjective:  says legs are painful   Vitals:   10/17/21 0600 10/17/21 0613  BP:  135/62  Pulse:  80  Resp:  18  Temp: 99 F (37.2 C)   SpO2:  100%   Physical Exam: Cardiac:  regular Lungs:  non labored, 3L Ong Incisions:  left AKA staples intact, Incisional VAC removed. No hematoma appreciated. No active bleeding   Extremities:  Right AKA well healed Abdomen:  morbidly obese Neurologic: alert and oriented  CBC    Component Value Date/Time   WBC 9.9 10/17/2021 0222   RBC 2.49 (L) 10/17/2021 0222   HGB 7.5 (L) 10/17/2021 0222   HCT 23.4 (L) 10/17/2021 0222   PLT 210 10/17/2021 0222   MCV 94.0 10/17/2021 0222   MCH 30.1 10/17/2021 0222   MCHC 32.1 10/17/2021 0222   RDW 16.0 (H) 10/17/2021 0222   LYMPHSABS 2.1 10/17/2021 0222   MONOABS 1.3 (H) 10/17/2021 0222   EOSABS 0.4 10/17/2021 0222   BASOSABS 0.1 10/17/2021 0222    BMET    Component Value Date/Time   NA 127 (L) 10/17/2021 0222   K 4.7 10/17/2021 0222   CL 93 (L) 10/17/2021 0222   CO2 26 10/17/2021 0222   GLUCOSE 119 (H) 10/17/2021 0222   BUN 29 (H) 10/17/2021 0222   CREATININE 3.75 (H) 10/17/2021 0222   CALCIUM 8.2 (L) 10/17/2021 0222   GFRNONAA 14 (L) 10/17/2021 0222   GFRAA 6 (L) 11/29/2019 0120    INR    Component Value Date/Time   INR 1.3 (H) 10/09/2021 1219     Intake/Output Summary (Last 24 hours) at 10/17/2021 0747 Last data filed at 10/16/2021 2200 Gross per 24 hour  Intake 120 ml  Output 0 ml  Net 120 ml     Assessment/Plan:  51 y.o. female is s/p left AKA 8 Days Post-Op   Incisional VAC removed today. Some bloody drainage on VAC but no active bleeding Incision is intact and healing well Will need staples removed in 1 month Will arrange outpatient follow up  Karoline Caldwell, PA-C [facility] [phone] 10/17/2021 7:47 AM

## 2021-10-17 NOTE — TOC Progression Note (Signed)
Transition of Care [facility]) - Initial/Assessment Note    Patient Details  Name: Courtney Gray MRN: [id number] Date of Birth: [birth year]  Transition of [facility]) CM/SW Contact:    Milinda Antis, [place] Phone Number: 10/17/2021, 11:02 AM  Clinical Narrative:                 CSW met with the patient at bedside and discussed discharge planning.  CSW informed the patient of the SNF that can accept her LT.  CSW also explained that the patient's dialysis clinic would need to be changed.  The patient was in agreement.  CSW attempted to contact the patient's daughter to provide an update. There was no answer.  CSW left a VM requesting a returned call.  CSW contacted [facility] to inquire about their ability to accept the patient over the weekend if medically ready.  The facility can accept the patient on Monday.    TOC will continue to follow.   Expected Discharge Plan: Skilled Nursing Facility Barriers to Discharge: Continued Medical Work up   Patient Goals and CMS Choice Patient states their goals for this hospitalization and ongoing recovery are:: SNF CMS Medicare.gov Compare Post Acute Care list provided to:: Patient Choice offered to / list presented to : Patient, Adult Children (Daughter, Antionette)  Expected Discharge Plan and Services Expected Discharge Plan: Skilled Nursing Facility In-house Referral: Clinical Social Work Discharge Planning Services: CM Consult Post Acute Care Choice: Durable Medical Equipment Living arrangements for the past 2 months: Single Family Home                                      Prior Living Arrangements/Services Living arrangements for the past 2 months: Single Family Home Lives with:: Adult Children Patient language and need for interpreter reviewed:: Yes Do you feel safe going back to the place where you live?: No   SNF  Need for Family Participation in Patient Care: Yes (Comment) Care giver support system in place?: Yes  (comment) Current home services: DME (Cane, w/c, shower sest, rollator, home O2) Criminal Activity/Legal Involvement Pertinent to Current Situation/Hospitalization: No - Comment as needed  Activities of Daily Living Home Assistive Devices/Equipment: Environmental consultant (specify type) ADL Screening (condition at time of admission) Patient's cognitive ability adequate to safely complete daily activities?: No Is the patient deaf or have difficulty hearing?: No Does the patient have difficulty seeing, even when wearing glasses/contacts?: No Does the patient have difficulty concentrating, remembering, or making decisions?: No Patient able to express need for assistance with ADLs?: Yes Does the patient have difficulty dressing or bathing?: Yes Independently performs ADLs?: No Communication: Independent Dressing (OT): Dependent Is this a change from baseline?: Pre-admission baseline Grooming: Needs assistance Is this a change from baseline?: Pre-admission baseline Feeding: Independent Bathing: Dependent Is this a change from baseline?: Pre-admission baseline Toileting: Dependent Is this a change from baseline?: Pre-admission baseline In/Out Bed: Dependent Is this a change from baseline?: Pre-admission baseline Walks in Home: Dependent Is this a change from baseline?: Change from baseline, expected to last >3 days Does the patient have difficulty walking or climbing stairs?: Yes Weakness of Legs: Both Weakness of Arms/Hands: Both  Permission Sought/Granted Permission sought to share information with : Case Manager, Family Supports, Chartered certified accountant granted to share information with : Yes, Verbal Permission Granted  Share Information with NAME: Antoinette  Permission granted to share info w AGENCY: SNF  Permission granted to share info w Relationship: daughter  Permission granted to share info w Contact Information: Lorenso Courier [phone]  Emotional  Assessment Appearance:: Appears stated age Attitude/Demeanor/Rapport: Gracious Affect (typically observed): Calm Orientation: : Oriented to Self, Oriented to Place, Oriented to  Time, Oriented to Situation (WDL) Alcohol / Substance Use: Not Applicable Psych Involvement: No (comment)  Admission diagnosis:  Splenic infarction [D73.5] Hypoglycemia [E16.2] ESRD (end stage renal disease) on dialysis ([place]) [N18.6, Z99.2] Hypotension [I95.9] Sepsis, due to unspecified organism, unspecified whether acute organ dysfunction present [facility]) [A41.9] Left upper quadrant abdominal pain [R10.12] Patient Active Problem List   Diagnosis Date Noted   Hepatic abscess    Pressure injury of skin 09/13/2021   PVD (peripheral vascular disease) ([place])    Hx of AKA (above knee amputation), right ([place])    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   Lactic acidosis 09/10/2021   Mixed diabetic hyperlipidemia associated with type 2 diabetes mellitus ([place]) 09/10/2021   Coronary artery disease involving native coronary artery of native heart without angina pectoris 09/10/2021   Elevated troponin level not due myocardial infarction 09/10/2021   Hypotension 09/10/2021   Dry gangrene ([place])    Pain 08/03/2021   Ischemic foot pain at rest 08/03/2021   Hypoglycemia 07/10/2021   Volume overload 07/09/2021   Acute pulmonary edema ([place])    Leukocytosis 06/11/2021   Acute gout 06/11/2021   Hyperkalemia 05/21/2021   Hemorrhoids    Hyperlipidemia 05/18/2021   Rectal bleeding 05/17/2021   Morbid obesity with BMI of 60.0-69.9, adult ([place]) 05/17/2021   NSTEMI (non-ST elevated myocardial infarction) ([place]) 05/16/2021   Essential hypertension 09/01/2020   ESRD (end stage renal disease) on dialysis ([place]) 09/01/2020   Chronic respiratory failure with hypoxia ([place]) 09/01/2020   Hypertensive urgency 11/28/2019   Uncontrolled type 2 diabetes mellitus with hypoglycemia, with long-term current use of insulin ([place]) 11/28/2019   Anemia of  chronic disease 11/28/2019   PCP:  Monico Blitz, MD Pharmacy:   [facility], [place] [address] [place] [address] Phone: [phone] Fax: [phone]  [facility] [address] [place] [address] Phone: [phone] Fax: [phone]     Social Determinants of Health (SDOH) Interventions    Readmission Risk Interventions    09/16/2021    4:36 PM 08/28/2021   11:50 AM 07/12/2021   12:38 PM  Readmission Risk Prevention Plan  Transportation Screening Complete Complete Complete  Medication Review (RN Care Manager)  Complete Complete  PCP or Specialist appointment within 3-5 days of discharge  Complete Complete  HRI or Home Care Consult Complete Complete Complete  SW Recovery Care/Counseling Consult Complete Complete Complete  Palliative Care Screening  Not Applicable Not Applicable  Skilled Nursing Facility Complete Not Applicable Complete

## 2021-10-17 NOTE — Progress Notes (Addendum)
Contacted [facility] admissions to request update on pt's referral. Awaiting response.   Melven Sartorius Renal Navigator [phone]  Addendum at 4:13 pm: Pt's referral is being reviewed by [facility] SW. Awaiting final approval/acceptance.

## 2021-10-18 ENCOUNTER — Inpatient Hospital Stay (HOSPITAL_COMMUNITY): Payer: 59

## 2021-10-18 DIAGNOSIS — D735 Infarction of spleen: Secondary | ICD-10-CM | POA: Diagnosis not present

## 2021-10-18 HISTORY — PX: IR US GUIDE VASC ACCESS LEFT: IMG2389

## 2021-10-18 HISTORY — PX: IR FLUORO GUIDE CV LINE LEFT: IMG2282

## 2021-10-18 LAB — CBC
HCT: 29.5 % — ABNORMAL LOW (ref 36.0–46.0)
Hemoglobin: 9.3 g/dL — ABNORMAL LOW (ref 12.0–15.0)
MCH: 29.3 pg (ref 26.0–34.0)
MCHC: 31.5 g/dL (ref 30.0–36.0)
MCV: 93.1 fL (ref 80.0–100.0)
Platelets: 187 10*3/uL (ref 150–400)
RBC: 3.17 MIL/uL — ABNORMAL LOW (ref 3.87–5.11)
RDW: 19.9 % — ABNORMAL HIGH (ref 11.5–15.5)
WBC: 12 10*3/uL — ABNORMAL HIGH (ref 4.0–10.5)
nRBC: 0 % (ref 0.0–0.2)

## 2021-10-18 LAB — GLUCOSE, CAPILLARY
Glucose-Capillary: 86 mg/dL (ref 70–99)
Glucose-Capillary: 85 mg/dL (ref 70–99)
Glucose-Capillary: 67 mg/dL — ABNORMAL LOW (ref 70–99)

## 2021-10-18 LAB — BASIC METABOLIC PANEL
Anion gap: 13 (ref 5–15)
BUN: 30 mg/dL — ABNORMAL HIGH (ref 6–20)
CO2: 23 mmol/L (ref 22–32)
Calcium: 7.8 mg/dL — ABNORMAL LOW (ref 8.9–10.3)
Chloride: 94 mmol/L — ABNORMAL LOW (ref 98–111)
Creatinine, Ser: 7.01 mg/dL — ABNORMAL HIGH (ref 0.44–1.00)
GFR, Estimated: 7 mL/min — ABNORMAL LOW (ref >60)
Glucose, Bld: 82 mg/dL (ref 70–99)
Potassium: 3.5 mmol/L (ref 3.5–5.1)
Sodium: 130 mmol/L — ABNORMAL LOW (ref 135–145)

## 2021-10-18 MED ORDER — MIDAZOLAM HCL 2 MG/2ML IJ SOLN
INTRAMUSCULAR | Status: AC | PRN
  Administered 2021-10-18: .5 mg via INTRAVENOUS

## 2021-10-18 MED ORDER — HEPARIN SODIUM (PORCINE) 1000 UNIT/ML IJ SOLN
INTRAMUSCULAR | Status: AC | PRN
  Administered 2021-10-18: 4200 [IU] via INTRAVENOUS

## 2021-10-18 MED ORDER — CHLORHEXIDINE GLUCONATE 4 % EX LIQD
CUTANEOUS | Status: AC
  Filled 2021-10-18: qty 15

## 2021-10-18 MED ORDER — VANCOMYCIN HCL IN DEXTROSE 1-5 GM/200ML-% IV SOLN
INTRAVENOUS | Status: AC
  Administered 2021-10-18: 1000 mg via INTRAVENOUS
  Filled 2021-10-18: qty 200

## 2021-10-18 MED ORDER — HEPARIN SODIUM (PORCINE) 1000 UNIT/ML IJ SOLN
INTRAMUSCULAR | Status: AC
  Filled 2021-10-18: qty 10

## 2021-10-18 MED ORDER — LIDOCAINE HCL 1 % IJ SOLN
INTRAMUSCULAR | Status: AC | PRN
  Administered 2021-10-18: 15 mL

## 2021-10-18 MED ORDER — FENTANYL CITRATE (PF) 100 MCG/2ML IJ SOLN
INTRAMUSCULAR | Status: AC
  Filled 2021-10-18: qty 2

## 2021-10-18 MED ORDER — MIDAZOLAM HCL 2 MG/2ML IJ SOLN
INTRAMUSCULAR | Status: AC
  Filled 2021-10-18: qty 2

## 2021-10-18 MED ORDER — LIDOCAINE HCL 1 % IJ SOLN
INTRAMUSCULAR | Status: AC
  Filled 2021-10-18: qty 20

## 2021-10-18 NOTE — Progress Notes (Addendum)
Pt has been accepted at [facility] SW on MWF 12:45 chair time. Pt can start on Monday. Pt will need to arrive at 11:45 am to complete paperwork prior to treatment. Attempted to speak with pt at bedside but she is asleep and did not wake up. Schedule letter left at bedside with details noted. Update provided to attending, nephrologist, renal PA, RN, and CSW. Renal PA advised of clinic's need for orders and request by clinic for orders to be send by 4:00 today for pt's possible start on Monday. Out-pt HD arrangements added to pt's AVS. Will assist as needed.   Melven Sartorius Renal Navigator [phone]  Addendum at 5:12 pm: Notified by clinic after 5:00 that pt's chair time will need to be 11:30 am. Pt will need to arrive at 10:45 to complete paperwork at first appt. TOC staff have left for the day. Will attempt to leave a message for weekend staff to please advise snf of time change. Will change time on AVS as well.

## 2021-10-18 NOTE — Progress Notes (Signed)
Pt alert and transported back to room. Unable to do tunneled HD line due to no open room. Patient to come back when IR room 1 opens. Pt notified

## 2021-10-18 NOTE — Procedures (Signed)
Interventional Radiology Procedure Note  Procedure: Left tunneled HD catheter placement  Complications: None  Estimated Blood Loss: < 10 mL  Findings: Right IJ and EJ chronically occluded by [place]. Left IJ open but stenotic. Other tortuous collaterals in left neck.  Able to get 28 cm tip to cuff length tunneled Palindrome catheter placed via left IJ with tip in RA. OK to use.  This is likely LAST tenable access in neck or chest for this patient, so would only remove catheter if absolutely imperative to do so in future. Otherwise, next access route is femoral vein, which due to patient's large pannus, would be a very high infection risk and also technically very difficult to place.  Venetia Night. Kathlene Cote, M.D Pager:  [phone]

## 2021-10-18 NOTE — Progress Notes (Signed)
Spoke with Dr. Gala Lewandowsky regarding low grade temp 100F and WBC today at 12

## 2021-10-18 NOTE — Progress Notes (Signed)
Subjective: Seen in room, no complaints awaiting line placement today by IR and then HD.  Objective Vital signs in last 24 hours: Vitals:   10/18/21 0444 10/18/21 0802 10/18/21 0935 10/18/21 1013  BP: (!) 134/56 (!) 189/82  (!) 166/81  Pulse: 77 85  85  Resp: 18 18  18   Temp: 99.6 F (37.6 C) 100 F (37.8 C) 99.8 F (37.7 C)   TempSrc: Oral Oral Oral   SpO2: 98% 97%  100%  Weight: (!) 145.4 kg     Height:       Weight change: -3.9 kg  Physical Exam: General: Obese chronically ill-appearing female, NAD Heart: RRR, no MRG Lungs: CTA on 3 L [place] O2 Abdomen: obese, NABS, S, NT ND, edema abdominal wall Extremities: Bilateral AKA with edema trace Dialysis Access: TDC/L  TL cath removed 7/11 line holiday, replacement pending today   Dialysis Orders: [name] TTS 4h 16min, BFR 400, EDW 151 kg, 1K/2.5Ca bath, TDC Hep 2,600 load and 1,500 hourly-Total dose 9275 units per treatment.  - mircera 200ug q2 due 09/10/21 - calcitriol 1 ug tiw - sensipar 30 mg tiw  Problem/Plan: Septic shock: 6/17= BC 1 set  + Bacteroides Thetaiotaomicron/required short-term pressors / TEE. Neg./Found to have liver abscess and concern for splenic abscess. ID -s/p TDC (IR) and L TL (IV team) catheter removal 7/11 for line holiday. Blood cultures obtained yesterday and so far (-). Plan for new line placement in IR 10/18/21. Remains on course of Cefepime with HD and oral metronidazole.  PAD s/p  R AKA, s/p amp.L mid. finger 09/23/2021. 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 Splenic abscess /Required splenic drain Ir 09/29/21 , culture NGTD  ESRD: K 3.5  Na 130 / HD per TTS schedule-received HD 7/11 and now on line holiday for 48-72 hours. Volume status and labs remain stable. Plan for HD today  7/14 after new line placement. Continue 4 hour treatments instead of 4 hr 36min for now. Will raise time back up if labs worsen and/or unable to remove adequate amounts of fluid. No heparin with HD. Chronic hypoxic resp failure - At baseline,  she is on 3L O2 at home. HTN/volume:  Optimize UF with HD. She has now reached her old EDW but need to lower further. With NA 130 and  CXR repeated still shows vascular congestion.  Anemia of ESRD:  Am HGB 9.3 ,S/p 6 units PRBCs , Current ESA schedule off d/t line holiday. Plan to give 1x SQ dose tomorrow to remain on schedule. Continue Aranesp 200 mcg. Continue to transfuse for Hgb after 7.     Secondary Hyperparathyroidism: CorrCa ok /Phos  3.8  requesting to hold binder due to constipation / Continue VDRA + sensipar.   T2DM- per primary team Malnutrition in setting of Morbid Obesity - Albumin 2.2, on protein supplements.    Ernest Haber, PA-C [facility] Beeper [phone] 10/18/2021,12:26 PM  LOS: 38 days   Labs: Basic Metabolic Panel: Recent Labs  Lab 10/12/21 0315 10/15/21 0421 10/17/21 0222 10/18/21 0413  NA 133* 130* 127* 130*  K 4.0 3.8 4.7 3.5  CL 96* 94* 93* 94*  CO2 24 24 26 23   GLUCOSE 120* 91 119* 82  BUN 28* 32* 29* 30*  CREATININE 5.25* 6.31* 3.75* 7.01*  CALCIUM 7.3* 7.6* 8.2* 7.8*  PHOS 5.1* 5.7* 3.8  --    Liver Function Tests: Recent Labs  Lab 10/12/21 0315 10/15/21 0421 10/17/21 0222  ALBUMIN 2.0* 2.0* 2.2*   No results  for input(s): "LIPASE", "AMYLASE" in the last 168 hours. No results for input(s): "AMMONIA" in the last 168 hours. CBC: Recent Labs  Lab 10/12/21 0315 10/12/21 1700 10/14/21 0353 10/14/21 1540 10/15/21 0421 10/17/21 0222 10/18/21 0413  WBC 20.7*  --  14.3*  --  15.8* 9.9 12.0*  NEUTROABS  --   --   --   --   --  5.9  --   HGB 7.4*   < > 6.4*   < > 9.5* 7.5* 9.3*  HCT 25.6*   < > 22.4*   < > 30.7* 23.4* 29.5*  MCV 97.7  --  99.6  --  93.6 94.0 93.1  PLT 217  --  158  --  199 210 187   < > = values in this interval not displayed.   Cardiac Enzymes: No results for input(s): "CKTOTAL", "CKMB", "CKMBINDEX", "TROPONINI" in the last 168 hours. CBG: Recent Labs  Lab 10/17/21 1137 10/17/21 1652 10/17/21 2139  10/18/21 0747 10/18/21 1119  GLUCAP 110* 103* 98 86 85    Studies/Results: No results found. Medications:  sodium chloride 10 mL/hr at 10/09/21 1410   ceFEPime (MAXIPIME) IV 2 g (10/15/21 1618)   magnesium sulfate bolus IVPB     vancomycin      amLODipine  10 mg Oral Daily   atorvastatin  10 mg Oral QHS   calcitRIOL  1 mcg Oral Q T,Th,Sa-HD   carvedilol  3.125 mg Oral BID WC   cinacalcet  30 mg Oral Q T,Th,Sa-HD   darbepoetin (ARANESP) injection - DIALYSIS  200 mcg Intravenous Q Sat-HD   docusate sodium  100 mg Oral Daily   feeding supplement  1 Container Oral TID BM   gabapentin  100 mg Oral Q12H   gabapentin  200 mg Oral Q T,Th,Sa-HD   Gerhardt's butt cream   Topical BID   heparin injection (subcutaneous)  5,000 Units Subcutaneous Q8H   metoCLOPramide (REGLAN) injection  5 mg Intravenous Q6H   metroNIDAZOLE  500 mg Oral Q12H   multivitamin  1 tablet Oral QHS   pantoprazole  40 mg Oral BID   polyethylene glycol  17 g Oral BID   sodium chloride flush  3 mL Intravenous Q12H   sodium chloride flush  5 mL Intracatheter Q8H

## 2021-10-18 NOTE — TOC Progression Note (Addendum)
Transition of Care [facility]) - Initial/Assessment Note    Patient Details  Name: Courtney Gray MRN: [id number] Date of Birth: 25-Dec-1970  Transition of Care [facility]) CM/SW Contact:    Milinda Antis, [place] Phone Number: 10/18/2021, 10:24 AM  Clinical Narrative:                 CSW informed by MD that patient may be medically ready for discharge on Sunday.  CSW contacted the facility and the facility is able to accept on that day.  CSW spoke with renal navigator, Melven Sartorius, and she is hopeful that the patient will have a new dialysis clinic assigned today.  Pending: medical readiness and dialysis clinic in [place].  1500-  CSW spoke with the patient's daughter and updated her on the plan for the patient to possibly d/c to [facility] over the weekend.    Dialysis has been arranged with [place] [facility].  See renal SW note.    [facility] can accept patient on Sunday if medically ready.   TOC  will continue to follow.    Expected Discharge Plan: Skilled Nursing Facility Barriers to Discharge: Continued Medical Work up   Patient Goals and CMS Choice Patient states their goals for this hospitalization and ongoing recovery are:: SNF CMS Medicare.gov Compare Post Acute Care list provided to:: Patient Choice offered to / list presented to : Patient, Adult Children (Daughter, Antionette)  Expected Discharge Plan and Services Expected Discharge Plan: Skilled Nursing Facility In-house Referral: Clinical Social Work Discharge Planning Services: CM Consult Post Acute Care Choice: Durable Medical Equipment Living arrangements for the past 2 months: Single Family Home                                      Prior Living Arrangements/Services Living arrangements for the past 2 months: Single Family Home Lives with:: Adult Children Patient language and need for interpreter reviewed:: Yes Do you feel safe going back to the place where you live?: No   SNF  Need for Family  Participation in Patient Care: Yes (Comment) Care giver support system in place?: Yes (comment) Current home services: DME (Cane, w/c, shower sest, rollator, home O2) Criminal Activity/Legal Involvement Pertinent to Current Situation/Hospitalization: No - Comment as needed  Activities of Daily Living Home Assistive Devices/Equipment: Environmental consultant (specify type) ADL Screening (condition at time of admission) Patient's cognitive ability adequate to safely complete daily activities?: No Is the patient deaf or have difficulty hearing?: No Does the patient have difficulty seeing, even when wearing glasses/contacts?: No Does the patient have difficulty concentrating, remembering, or making decisions?: No Patient able to express need for assistance with ADLs?: Yes Does the patient have difficulty dressing or bathing?: Yes Independently performs ADLs?: No Communication: Independent Dressing (OT): Dependent Is this a change from baseline?: Pre-admission baseline Grooming: Needs assistance Is this a change from baseline?: Pre-admission baseline Feeding: Independent Bathing: Dependent Is this a change from baseline?: Pre-admission baseline Toileting: Dependent Is this a change from baseline?: Pre-admission baseline In/Out Bed: Dependent Is this a change from baseline?: Pre-admission baseline Walks in Home: Dependent Is this a change from baseline?: Change from baseline, expected to last >3 days Does the patient have difficulty walking or climbing stairs?: Yes Weakness of Legs: Both Weakness of Arms/Hands: Both  Permission Sought/Granted Permission sought to share information with : Case Manager, Family Supports, Customer service manager Permission granted to share information with :  Yes, Verbal Permission Granted  Share Information with NAME: Lorenso Courier  Permission granted to share info w AGENCY: SNF  Permission granted to share info w Relationship: daughter  Permission granted to share  info w Contact Information: Lorenso Courier [phone]  Emotional Assessment Appearance:: Appears stated age Attitude/Demeanor/Rapport: Gracious Affect (typically observed): Calm Orientation: : Oriented to Self, Oriented to Place, Oriented to  Time, Oriented to Situation (WDL) Alcohol / Substance Use: Not Applicable Psych Involvement: No (comment)  Admission diagnosis:  Splenic infarction [D73.5] Hypoglycemia [E16.2] ESRD (end stage renal disease) on dialysis ([place]) [N18.6, Z99.2] Hypotension [I95.9] Sepsis, due to unspecified organism, unspecified whether acute organ dysfunction present [facility]) [A41.9] Left upper quadrant abdominal pain [R10.12] Patient Active Problem List   Diagnosis Date Noted   Hepatic abscess    Pressure injury of skin 09/13/2021   PVD (peripheral vascular disease) ([place])    Hx of AKA (above knee amputation), right ([place])    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   Lactic acidosis 09/10/2021   Mixed diabetic hyperlipidemia associated with type 2 diabetes mellitus ([place]) 09/10/2021   Coronary artery disease involving native coronary artery of native heart without angina pectoris 09/10/2021   Elevated troponin level not due myocardial infarction 09/10/2021   Hypotension 09/10/2021   Dry gangrene ([place])    Pain 08/03/2021   Ischemic foot pain at rest 08/03/2021   Hypoglycemia 07/10/2021   Volume overload 07/09/2021   Acute pulmonary edema ([place])    Leukocytosis 06/11/2021   Acute gout 06/11/2021   Hyperkalemia 05/21/2021   Hemorrhoids    Hyperlipidemia 05/18/2021   Rectal bleeding 05/17/2021   Morbid obesity with BMI of 60.0-69.9, adult ([place]) 05/17/2021   NSTEMI (non-ST elevated myocardial infarction) ([place]) 05/16/2021   Essential hypertension 09/01/2020   ESRD (end stage renal disease) on dialysis ([place]) 09/01/2020   Chronic respiratory failure with hypoxia ([place]) 09/01/2020   Hypertensive urgency 11/28/2019   Uncontrolled type 2 diabetes mellitus with hypoglycemia, with  long-term current use of insulin ([place]) 11/28/2019   Anemia of chronic disease 11/28/2019   PCP:  Monico Blitz, MD Pharmacy:   [place], [place] [address] [place] [address] Phone: [phone] Fax: [phone]  [facility] [address] [place] [address] Phone: [phone] Fax: [phone]     Social Determinants of Health (SDOH) Interventions    Readmission Risk Interventions    09/16/2021    4:36 PM 08/28/2021   11:50 AM 07/12/2021   12:38 PM  Readmission Risk Prevention Plan  Transportation Screening Complete Complete Complete  Medication Review (RN Care Manager)  Complete Complete  PCP or Specialist appointment within 3-5 days of discharge  Complete Complete  HRI or Home Care Consult Complete Complete Complete  SW Recovery Care/Counseling Consult Complete Complete Complete  Palliative Care Screening  Not Applicable Not Applicable  Skilled Nursing Facility Complete Not Applicable Complete

## 2021-10-18 NOTE — Progress Notes (Signed)
PROGRESS NOTE    Courtney Gray  KZL:[id number] DOB: [birth year] DOA: 09/09/2021 PCP: Monico Blitz, MD     Brief Narrative:  Courtney Gray is a 51 year old female with end-stage renal disease, chronic HFpEF with grade 2 diastolic dysfunction, insulin requiring diabetes mellitus, morbid obesity chronically on 2 L of oxygen, peripheral vascular disease status post right AKA on 5/10 for critical limb ischemia. The patient presented to the ED on 09/09/2021 with severe left upper quadrant pain and CT revealed a splenic infarct.  She was started on IV heparin and briefly required pressors for shock.  6/17 the patient grew out Bacteroides Thetaiotaomicron in anaerobic bottle of one set 6/19 amputation of the third digit on the left hand for gangrene 6/23 TEE was negative for endocarditis-calcified mass noted in right atrium of unclear etiology-CT revealed liver abscess and subcapsular splenic hematoma-Eliquis held 6/25 CT-guided drainage of liver abscess-culture negative- drain removed on 7/3 7/5-left AKA for left first and second gangrene 7/7-drain placed into splenic abscess with 350 cc of pus- culture NGTD 7/11--line holiday, TDC removed   New events last 24 hours / Subjective: No complaints of chest pain.  WBC bumped up to 12 this morning.  Repeat blood cultures remain negative   Assessment & Plan:   Principal Problem:   Splenic infarction Active Problems:   Lactic acidosis   Elevated troponin level not due myocardial infarction   Chronic respiratory failure with hypoxia (HCC)   ESRD (end stage renal disease) on dialysis ([place])   Uncontrolled type 2 diabetes mellitus with hypoglycemia, with long-term current use of insulin (HCC)   Essential hypertension   Mixed diabetic hyperlipidemia associated with type 2 diabetes mellitus (HCC)   Leukocytosis   Dry gangrene (HCC)   Hypotension   PVD (peripheral vascular disease) (HCC)   Hx of AKA (above knee amputation), right (HCC)   Pressure  injury of skin   Hepatic abscess   Splenic infarction, thought to be secondary to septic embolus -Heparin was discontinued as splenic infarction was thought to be secondary to septic embolus and not a blood clot  Septic shock secondary to bacteremia, Bacteroides thetaiotaomicron -Appreciate infectious disease.  Receiving cefepime with dialysis + PO flagyl, end date 11/08/2021 -Tunneled dialysis catheter and left TL catheter removed removed 7/11 in setting of persistent leukocytosis to give line holiday -Replace tunneled dialysis catheter 7/14 -Repeat blood culture drawn 7/12 is negative growth to date  Hepatic and splenic abscesses -Abscess culture showing Staph epidermidis, pansensitive -Continue to monitor drain output from left upper quadrant splenic drain  Anemia of chronic disease -Received 1 unit packed red blood cells 7/10  ESRD on dialysis TTSa -Nephrology following  Uncontrolled type 2 diabetes mellitus with hypoglycemia -A1c 5.7 -Sliding scale insulin discontinued as patient has been refusing it  HTN -Norvasc, coreg   HLD -Lipitor   PAD -Status post right AKA -Status post left AKA for gangrene.  Wound VAC removed.  Vascular surgery following.  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 pink (Active)  09/11/21  0000  Location: Buttocks  Location Orientation: Left  Staging: Stage 2 -  Partial thickness loss of dermis presenting as a shallow open injury with a red, pink wound bed without slough.   Wound Description (Comments): pink  Present on Admission: Yes  Dressing Type Foam - Lift dressing to assess site every shift 10/18/21 0815     Pressure Injury 09/11/21 Buttocks Right Stage 2 -  Partial thickness loss of dermis presenting as a shallow open injury with a red, pink wound bed without slough. pink (Active)  09/11/21 0000  Location: Buttocks  Location Orientation: Right  Staging: Stage 2 -  Partial thickness loss of dermis presenting as a shallow open injury with a red, pink wound bed without slough.  Wound Description (Comments): pink  Present on Admission: Yes  Dressing Type Foam - Lift dressing to assess site every shift 10/18/21 0815     Nutrition Problem: Inadequate oral intake Etiology: nausea   DVT prophylaxis:  heparin injection 5,000 Units Start: 10/15/21 1600 SCD's Start: 10/09/21 1713  Code Status: Full Family Communication: None at bedside  Disposition Plan:  Status is: Inpatient Remains inpatient appropriate because: Tunneled dialysis catheter to be replaced and to resume back on dialysis.  Also awaiting new dialysis clinic assignment   Antimicrobials:  Anti-infectives (From admission, onward)    Start     Dose/Rate Route Frequency Ordered Stop   10/18/21 0000  vancomycin (VANCOCIN) IVPB 1000 mg/200 mL premix        1,000 mg 200 mL/hr over 60 Minutes Intravenous To Radiology 10/17/21 1712 10/19/21 0000   10/16/21 2200  metroNIDAZOLE (FLAGYL) tablet 500 mg        500 mg Oral Every 12 hours 10/16/21 1128 11/09/21 0959   09/25/21 0915  metroNIDAZOLE (FLAGYL) IVPB 500 mg  Status:  Discontinued        500 mg 100 mL/hr over 60 Minutes Intravenous Every 12 hours 09/25/21 0822 10/16/21 1128   09/13/21 1000  vancomycin (VANCOCIN) IVPB 1000 mg/200 mL premix        1,000 mg 200 mL/hr over 60 Minutes Intravenous STAT 09/13/21 0904 09/13/21 1140   09/10/21 1200  vancomycin (VANCOCIN) IVPB 1000 mg/200 mL premix  Status:  Discontinued        1,000 mg 200  mL/hr over 60 Minutes Intravenous Every T-Th-Sa (Hemodialysis) 09/10/21 0620 09/17/21 1013   09/10/21 1200  ceFEPIme (MAXIPIME) 2 g in sodium chloride 0.9 % 100 mL IVPB        2 g 200 mL/hr over 30 Minutes Intravenous Every T-Th-Sa (Hemodialysis) 09/10/21 1107 11/08/21 2359   09/10/21 0445  vancomycin (VANCOREADY) IVPB 2000 mg/400 mL        2,000 mg 200 mL/hr over 120 Minutes Intravenous  Once 09/10/21 0435 09/10/21 0800   09/10/21 0430  ceFEPIme (MAXIPIME) 2 g in sodium chloride 0.9 % 100 mL IVPB        2 g 200 mL/hr over 30 Minutes Intravenous  Once 09/10/21 0420 09/10/21 0550        Objective: Vitals:   10/18/21 0444 10/18/21 0802 10/18/21 0935 10/18/21 1013  BP: (!) 134/56 (!) 189/82  (!) 166/81  Pulse: 77 85  85  Resp: 18 18  18   Temp: 99.6 F (37.6 C) 100 F (37.8 C) 99.8 F (37.7 C)   TempSrc: Oral Oral Oral   SpO2: 98% 97%  100%  Weight: (!) 145.4 kg     Height:        Intake/Output Summary (  Last 24 hours) at 10/18/2021 1219 Last data filed at 10/18/2021 0815 Gross per 24 hour  Intake 520 ml  Output 70 ml  Net 450 ml    Filed Weights   10/15/21 1254 10/17/21 0500 10/18/21 0444  Weight: (!) 144.8 kg (!) 149.3 kg (!) 145.4 kg    Examination:  General exam: Appears calm, morbidly obese Respiratory system: Clear to auscultation. Respiratory effort normal. No respiratory distress. No conversational dyspnea.  On room air Cardiovascular system: S1 & S2 heard, RRR. No murmurs.  Gastrointestinal system: Abdomen is nondistended, soft and nontender. Normal bowel sounds heard. Central nervous system: Alert and oriented. Extremities: Bilateral lower extremity AKA, left middle finger amputation Psychiatry: Judgement and insight appear normal. Mood & affect appropriate.   Data Reviewed: I have personally reviewed following labs and imaging studies  CBC: Recent Labs  Lab 10/12/21 0315 10/12/21 1700 10/14/21 0353 10/14/21 1540 10/15/21 0421 10/17/21 0222  10/18/21 0413  WBC 20.7*  --  14.3*  --  15.8* 9.9 12.0*  NEUTROABS  --   --   --   --   --  5.9  --   HGB 7.4*   < > 6.4* 9.0* 9.5* 7.5* 9.3*  HCT 25.6*   < > 22.4* 29.5* 30.7* 23.4* 29.5*  MCV 97.7  --  99.6  --  93.6 94.0 93.1  PLT 217  --  158  --  199 210 187   < > = values in this interval not displayed.    Basic Metabolic Panel: Recent Labs  Lab 10/12/21 0315 10/15/21 0421 10/17/21 0222 10/18/21 0413  NA 133* 130* 127* 130*  K 4.0 3.8 4.7 3.5  CL 96* 94* 93* 94*  CO2 24 24 26 23   GLUCOSE 120* 91 119* 82  BUN 28* 32* 29* 30*  CREATININE 5.25* 6.31* 3.75* 7.01*  CALCIUM 7.3* 7.6* 8.2* 7.8*  PHOS 5.1* 5.7* 3.8  --     GFR: Estimated Creatinine Clearance: 13.8 mL/min (A) (by C-G formula based on SCr of 7.01 mg/dL (H)). Liver Function Tests: Recent Labs  Lab 10/12/21 0315 10/15/21 0421 10/17/21 0222  ALBUMIN 2.0* 2.0* 2.2*    No results for input(s): "LIPASE", "AMYLASE" in the last 168 hours. No results for input(s): "AMMONIA" in the last 168 hours. Coagulation Profile: No results for input(s): "INR", "PROTIME" in the last 168 hours.  Cardiac Enzymes: No results for input(s): "CKTOTAL", "CKMB", "CKMBINDEX", "TROPONINI" in the last 168 hours. BNP (last 3 results) No results for input(s): "PROBNP" in the last 8760 hours. HbA1C: No results for input(s): "HGBA1C" in the last 72 hours. CBG: Recent Labs  Lab 10/17/21 1137 10/17/21 1652 10/17/21 2139 10/18/21 0747 10/18/21 1119  GLUCAP 110* 103* 98 86 85    Lipid Profile: No results for input(s): "CHOL", "HDL", "LDLCALC", "TRIG", "CHOLHDL", "LDLDIRECT" in the last 72 hours. Thyroid Function Tests: No results for input(s): "TSH", "T4TOTAL", "FREET4", "T3FREE", "THYROIDAB" in the last 72 hours. Anemia Panel: No results for input(s): "VITAMINB12", "FOLATE", "FERRITIN", "TIBC", "IRON", "RETICCTPCT" in the last 72 hours. Sepsis Labs: No results for input(s): "PROCALCITON", "LATICACIDVEN" in the last 168  hours.  Recent Results (from the past 240 hour(s))  Aerobic/Anaerobic Culture w Gram Stain (surgical/deep wound)     Status: None   Collection Time: 10/11/21  5:00 PM   Specimen: Abscess  Result Value Ref Range Status   Specimen Description ABSCESS  Final   Special Requests SPLEEN  Final   Gram Stain   Final  ABUNDANT WBC PRESENT,BOTH PMN AND MONONUCLEAR NO ORGANISMS SEEN    Culture   Final    RARE STAPHYLOCOCCUS EPIDERMIDIS NO ANAEROBES ISOLATED Performed at [facility], [place] [address], [place], [place] [address]    Report Status 10/16/2021 FINAL  Final   Organism ID, Bacteria STAPHYLOCOCCUS EPIDERMIDIS  Final      Susceptibility   Staphylococcus epidermidis - MIC*    CIPROFLOXACIN <=0.5 SENSITIVE Sensitive     ERYTHROMYCIN <=0.25 SENSITIVE Sensitive     GENTAMICIN <=0.5 SENSITIVE Sensitive     OXACILLIN <=0.25 SENSITIVE Sensitive     TETRACYCLINE <=1 SENSITIVE Sensitive     VANCOMYCIN 1 SENSITIVE Sensitive     TRIMETH/SULFA <=10 SENSITIVE Sensitive     CLINDAMYCIN <=0.25 SENSITIVE Sensitive     RIFAMPIN <=0.5 SENSITIVE Sensitive     Inducible Clindamycin NEGATIVE Sensitive     * RARE STAPHYLOCOCCUS EPIDERMIDIS  Culture, blood (Routine X 2) w Reflex to ID Panel     Status: None (Preliminary result)   Collection Time: 10/16/21  3:13 PM   Specimen: BLOOD RIGHT HAND  Result Value Ref Range Status   Specimen Description BLOOD RIGHT HAND  Final   Special Requests   Final    BOTTLES DRAWN AEROBIC AND ANAEROBIC Blood Culture results may not be optimal due to an inadequate volume of blood received in culture bottles   Culture   Final    NO GROWTH 2 DAYS Performed at [facility], [place] [address] [place], [place] [address]    Report Status PENDING  Incomplete  Culture, blood (Routine X 2) w Reflex to ID Panel     Status: None (Preliminary result)   Collection Time: 10/16/21  3:34 PM   Specimen: BLOOD  Result Value Ref Range Status   Specimen Description BLOOD  LEFT ANTECUBITAL  Final   Special Requests   Final    BOTTLES DRAWN AEROBIC AND ANAEROBIC Blood Culture results may not be optimal due to an inadequate volume of blood received in culture bottles   Culture   Final    NO GROWTH 2 DAYS Performed at [facility], [place] [address] [place], [place] [address]    Report Status PENDING  Incomplete      Radiology Studies: No results found.    Scheduled Meds:  amLODipine  10 mg Oral Daily   atorvastatin  10 mg Oral QHS   calcitRIOL  1 mcg Oral Q T,Th,Sa-HD   carvedilol  3.125 mg Oral BID WC   cinacalcet  30 mg Oral Q T,Th,Sa-HD   darbepoetin (ARANESP) injection - DIALYSIS  200 mcg Intravenous Q Sat-HD   docusate sodium  100 mg Oral Daily   feeding supplement  1 Container Oral TID BM   gabapentin  100 mg Oral Q12H   gabapentin  200 mg Oral Q T,Th,Sa-HD   Gerhardt's butt cream   Topical BID   heparin injection (subcutaneous)  5,000 Units Subcutaneous Q8H   metoCLOPramide (REGLAN) injection  5 mg Intravenous Q6H   metroNIDAZOLE  500 mg Oral Q12H   multivitamin  1 tablet Oral QHS   pantoprazole  40 mg Oral BID   polyethylene glycol  17 g Oral BID   sodium chloride flush  3 mL Intravenous Q12H   sodium chloride flush  5 mL Intracatheter Q8H   Continuous Infusions:  sodium chloride 10 mL/hr at 10/09/21 1410   ceFEPime (MAXIPIME) IV 2 g (10/15/21 1618)   magnesium sulfate bolus IVPB  vancomycin       LOS: 38 days     Dessa Phi, DO [facility] 10/18/2021, 12:19 PM   Available via Epic secure chat 7am-7pm After these hours, please refer to coverage provider listed on [email or web address]

## 2021-10-19 DIAGNOSIS — D735 Infarction of spleen: Secondary | ICD-10-CM | POA: Diagnosis not present

## 2021-10-19 LAB — CBC
HCT: 29.6 % — ABNORMAL LOW (ref 36.0–46.0)
Hemoglobin: 9.4 g/dL — ABNORMAL LOW (ref 12.0–15.0)
MCH: 29.1 pg (ref 26.0–34.0)
MCHC: 31.8 g/dL (ref 30.0–36.0)
MCV: 91.6 fL (ref 80.0–100.0)
Platelets: 209 10*3/uL (ref 150–400)
RBC: 3.23 MIL/uL — ABNORMAL LOW (ref 3.87–5.11)
RDW: 19.3 % — ABNORMAL HIGH (ref 11.5–15.5)
WBC: 13.5 10*3/uL — ABNORMAL HIGH (ref 4.0–10.5)
nRBC: 0 % (ref 0.0–0.2)

## 2021-10-19 LAB — BASIC METABOLIC PANEL
Anion gap: 13 (ref 5–15)
BUN: 37 mg/dL — ABNORMAL HIGH (ref 6–20)
CO2: 21 mmol/L — ABNORMAL LOW (ref 22–32)
Calcium: 7.6 mg/dL — ABNORMAL LOW (ref 8.9–10.3)
Chloride: 93 mmol/L — ABNORMAL LOW (ref 98–111)
Creatinine, Ser: 8.23 mg/dL — ABNORMAL HIGH (ref 0.44–1.00)
GFR, Estimated: 5 mL/min — ABNORMAL LOW (ref >60)
Glucose, Bld: 94 mg/dL (ref 70–99)
Potassium: 3.6 mmol/L (ref 3.5–5.1)
Sodium: 127 mmol/L — ABNORMAL LOW (ref 135–145)

## 2021-10-19 LAB — GLUCOSE, CAPILLARY
Glucose-Capillary: 101 mg/dL — ABNORMAL HIGH (ref 70–99)
Glucose-Capillary: 138 mg/dL — ABNORMAL HIGH (ref 70–99)

## 2021-10-19 NOTE — Progress Notes (Signed)
Current [id number]-88

## 2021-10-19 NOTE — Progress Notes (Signed)
PROGRESS NOTE    Courtney Gray  SEG:[id number] DOB: [birth year] DOA: 09/09/2021 PCP: Monico Blitz, MD     Brief Narrative:  Courtney Gray is a 51 year old female with end-stage renal disease, chronic HFpEF with grade 2 diastolic dysfunction, insulin requiring diabetes mellitus, morbid obesity chronically on 2 L of oxygen, peripheral vascular disease status post right AKA on 5/10 for critical limb ischemia. The patient presented to the ED on 09/09/2021 with severe left upper quadrant pain and CT revealed a splenic infarct.  She was started on IV heparin and briefly required pressors for shock.  6/17 the patient grew out Bacteroides Thetaiotaomicron in anaerobic bottle of one set 6/19 amputation of the third digit on the left hand for gangrene 6/23 TEE was negative for endocarditis-calcified mass noted in right atrium of unclear etiology-CT revealed liver abscess and subcapsular splenic hematoma-Eliquis held 6/25 CT-guided drainage of liver abscess-culture negative- drain removed on 7/3 7/5-left AKA for left first and second gangrene 7/7-drain placed into splenic abscess with 350 cc of pus- culture NGTD 7/11-line holiday, TDC removed  7/14-left TDC replaced (noted IR states this is likely LAST tenable access and would only remove catheter in future if absolutely imperative)   New events last 24 hours / Subjective: Patient seen in dialysis unit.  She has no physical complaints today.  Denies any fevers, chills, sweats, chest pain or abdominal pain.  WBC trending upward.  Assessment & Plan:   Principal Problem:   Splenic infarction Active Problems:   Lactic acidosis   Elevated troponin level not due myocardial infarction   Chronic respiratory failure with hypoxia (HCC)   ESRD (end stage renal disease) on dialysis ([place])   Uncontrolled type 2 diabetes mellitus with hypoglycemia, with long-term current use of insulin (HCC)   Essential hypertension   Mixed diabetic hyperlipidemia associated  with type 2 diabetes mellitus (HCC)   Leukocytosis   Dry gangrene (HCC)   Hypotension   PVD (peripheral vascular disease) (HCC)   Hx of AKA (above knee amputation), right (HCC)   Pressure injury of skin   Hepatic abscess   Splenic infarction, thought to be secondary to septic embolus -Heparin was discontinued as splenic infarction was thought to be secondary to septic embolus and not a blood clot  Septic shock secondary to bacteremia, Bacteroides thetaiotaomicron -Appreciate infectious disease.  Receiving cefepime with dialysis + PO flagyl, end date 11/08/2021 -Tunneled dialysis catheter and left TL catheter removed removed 7/11 in setting of persistent leukocytosis to give line holiday -Replace tunneled dialysis catheter 7/14 -Repeat blood culture drawn 7/12 is negative growth to date -If WBC continues to trend upward tomorrow, would repeat blood cultures and repeat CT abdomen  Hepatic and splenic abscesses -Abscess culture showing Staph epidermidis, pansensitive -Continue to monitor drain output from left upper quadrant splenic drain 25 cc yesterday   Anemia of chronic disease -Received 1 unit packed red blood cells 7/10  ESRD on dialysis TTSa -Nephrology following  Uncontrolled type 2 diabetes mellitus with hypoglycemia -A1c 5.7 -Sliding scale insulin discontinued as patient has been refusing it  HTN -Norvasc, coreg   HLD -Lipitor   PAD -Status post right AKA -Status post left AKA for gangrene.  Wound VAC removed.  Vascular surgery following.  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 pink (Active)  09/11/21 0000  Location: Buttocks  Location Orientation: Left  Staging: Stage 2 -  Partial thickness loss of dermis presenting as a shallow open injury with a red, pink wound bed without slough.  Wound Description (Comments): pink  Present on Admission: Yes  Dressing Type Foam - Lift dressing to assess site every shift 10/18/21 2211     Pressure Injury 09/11/21 Buttocks Right Stage 2 -  Partial thickness loss of dermis presenting as a shallow open injury with a red, pink wound bed without slough. pink (Active)  09/11/21 0000  Location: Buttocks  Location Orientation: Right  Staging: Stage 2 -  Partial thickness loss of dermis presenting as a shallow open injury with a red, pink wound bed without slough.  Wound Description (Comments): pink  Present on Admission: Yes  Dressing Type Foam - Lift dressing to assess site every shift 10/18/21 2211     Nutrition Problem: Inadequate oral intake Etiology: nausea   DVT prophylaxis:  heparin injection 5,000 Units Start: 10/15/21 1600 SCD's Start: 10/09/21 1713  Code Status: Full Family Communication: None at bedside  Disposition Plan:  Status is: Inpatient Remains inpatient appropriate because: Tunneled dialysis catheter to be replaced and to resume back on dialysis.  Also awaiting new dialysis clinic assignment   Antimicrobials:  Anti-infectives (From admission, onward)    Start     Dose/Rate Route Frequency Ordered Stop   10/18/21 0000  vancomycin (VANCOCIN) IVPB 1000 mg/200 mL premix        1,000 mg 200 mL/hr over 60 Minutes Intravenous To Radiology 10/17/21 1712 10/18/21 2005   10/16/21 2200  metroNIDAZOLE (FLAGYL) tablet 500 mg        500 mg Oral Every 12 hours 10/16/21 1128 11/09/21 0959   09/25/21 0915  metroNIDAZOLE (FLAGYL) IVPB 500 mg  Status:  Discontinued        500 mg 100 mL/hr over 60 Minutes Intravenous Every  12 hours 09/25/21 0822 10/16/21 1128   09/13/21 1000  vancomycin (VANCOCIN) IVPB 1000 mg/200 mL premix        1,000 mg 200 mL/hr over 60 Minutes Intravenous STAT 09/13/21 0904 09/13/21 1140   09/10/21 1200  vancomycin (VANCOCIN) IVPB 1000 mg/200 mL premix  Status:  Discontinued        1,000 mg 200 mL/hr over 60 Minutes Intravenous Every T-Th-Sa (Hemodialysis) 09/10/21 0620 09/17/21 1013   09/10/21 1200  ceFEPIme (MAXIPIME) 2 g in sodium chloride 0.9 % 100 mL IVPB        2 g 200 mL/hr over 30 Minutes Intravenous Every T-Th-Sa (Hemodialysis) 09/10/21 1107 11/08/21 2359   09/10/21 0445  vancomycin (VANCOREADY) IVPB 2000 mg/400 mL        2,000 mg 200 mL/hr over 120 Minutes Intravenous  Once 09/10/21 0435 09/10/21 0800   09/10/21 0430  ceFEPIme (MAXIPIME) 2 g in sodium chloride 0.9 % 100 mL IVPB        2 g 200 mL/hr over 30 Minutes Intravenous  Once 09/10/21 0420 09/10/21 0550        Objective: Vitals:   10/19/21 1100 10/19/21 1121 10/19/21 1130 10/19/21 1230  BP: (!) 107/56 113/68 (!) 123/55 138/64  Pulse: 82 79 88 87  Resp: 15 14  17 17  Temp:    98.4 F (36.9 C)  TempSrc:      SpO2: 100% 100% 100% 100%  Weight:   (!) 145.3 kg   Height:        Intake/Output Summary (Last 24 hours) at 10/19/2021 1317 Last data filed at 10/19/2021 1130 Gross per 24 hour  Intake 0 ml  Output 26.1 ml  Net -26.1 ml    Filed Weights   10/18/21 0444 10/19/21 0738 10/19/21 1130  Weight: (!) 145.4 kg (!) 147.8 kg (!) 145.3 kg    Examination:  General exam: Appears calm, morbidly obese Respiratory system: Clear to auscultation. Respiratory effort normal. No respiratory distress. No conversational dyspnea.  On room air Cardiovascular system: S1 & S2 heard, RRR. No murmurs.  Gastrointestinal system: Abdomen is nondistended, soft and nontender. Central nervous system: Alert and oriented. Extremities: Bilateral lower extremity AKA, left middle finger amputation Psychiatry: Judgement and insight  appear normal. Mood & affect appropriate.   Data Reviewed: I have personally reviewed following labs and imaging studies  CBC: Recent Labs  Lab 10/14/21 0353 10/14/21 1540 10/15/21 0421 10/17/21 0222 10/18/21 0413 10/19/21 0800  WBC 14.3*  --  15.8* 9.9 12.0* 13.5*  NEUTROABS  --   --   --  5.9  --   --   HGB 6.4* 9.0* 9.5* 7.5* 9.3* 9.4*  HCT 22.4* 29.5* 30.7* 23.4* 29.5* 29.6*  MCV 99.6  --  93.6 94.0 93.1 91.6  PLT 158  --  199 210 187 725    Basic Metabolic Panel: Recent Labs  Lab 10/15/21 0421 10/17/21 0222 10/18/21 0413 10/19/21 0800  NA 130* 127* 130* 127*  K 3.8 4.7 3.5 3.6  CL 94* 93* 94* 93*  CO2 24 26 23  21*  GLUCOSE 91 119* 82 94  BUN 32* 29* 30* 37*  CREATININE 6.31* 3.75* 7.01* 8.23*  CALCIUM 7.6* 8.2* 7.8* 7.6*  PHOS 5.7* 3.8  --   --     GFR: Estimated Creatinine Clearance: 11.8 mL/min (A) (by C-G formula based on SCr of 8.23 mg/dL (H)). Liver Function Tests: Recent Labs  Lab 10/15/21 0421 10/17/21 0222  ALBUMIN 2.0* 2.2*    No results for input(s): "LIPASE", "AMYLASE" in the last 168 hours. No results for input(s): "AMMONIA" in the last 168 hours. Coagulation Profile: No results for input(s): "INR", "PROTIME" in the last 168 hours.  Cardiac Enzymes: No results for input(s): "CKTOTAL", "CKMB", "CKMBINDEX", "TROPONINI" in the last 168 hours. BNP (last 3 results) No results for input(s): "PROBNP" in the last 8760 hours. HbA1C: No results for input(s): "HGBA1C" in the last 72 hours. CBG: Recent Labs  Lab 10/17/21 2139 10/18/21 0747 10/18/21 1119 10/18/21 2100 10/19/21 1229  GLUCAP 98 86 85 67* 101*    Lipid Profile: No results for input(s): "CHOL", "HDL", "LDLCALC", "TRIG", "CHOLHDL", "LDLDIRECT" in the last 72 hours. Thyroid Function Tests: No results for input(s): "TSH", "T4TOTAL", "FREET4", "T3FREE", "THYROIDAB" in the last 72 hours. Anemia Panel: No results for input(s): "VITAMINB12", "FOLATE", "FERRITIN", "TIBC", "IRON",  "RETICCTPCT" in the last 72 hours. Sepsis Labs: No results for input(s): "PROCALCITON", "LATICACIDVEN" in the last 168 hours.  Recent Results (from the past 240 hour(s))  Aerobic/Anaerobic Culture w Gram Stain (surgical/deep wound)     Status: None   Collection Time: 10/11/21  5:00 PM   Specimen: Abscess  Result Value Ref Range Status   Specimen Description ABSCESS  Final   Special Requests SPLEEN  Final   Gram Stain  Final    ABUNDANT WBC PRESENT,BOTH PMN AND MONONUCLEAR NO ORGANISMS SEEN    Culture   Final    RARE STAPHYLOCOCCUS EPIDERMIDIS NO ANAEROBES ISOLATED Performed at [facility], [address], [place], [place] [address]    Report Status 10/16/2021 FINAL  Final   Organism ID, Bacteria STAPHYLOCOCCUS EPIDERMIDIS  Final      Susceptibility   Staphylococcus epidermidis - MIC*    CIPROFLOXACIN <=0.5 SENSITIVE Sensitive     ERYTHROMYCIN <=0.25 SENSITIVE Sensitive     GENTAMICIN <=0.5 SENSITIVE Sensitive     OXACILLIN <=0.25 SENSITIVE Sensitive     TETRACYCLINE <=1 SENSITIVE Sensitive     VANCOMYCIN 1 SENSITIVE Sensitive     TRIMETH/SULFA <=10 SENSITIVE Sensitive     CLINDAMYCIN <=0.25 SENSITIVE Sensitive     RIFAMPIN <=0.5 SENSITIVE Sensitive     Inducible Clindamycin NEGATIVE Sensitive     * RARE STAPHYLOCOCCUS EPIDERMIDIS  Culture, blood (Routine X 2) w Reflex to ID Panel     Status: None (Preliminary result)   Collection Time: 10/16/21  3:13 PM   Specimen: BLOOD RIGHT HAND  Result Value Ref Range Status   Specimen Description BLOOD RIGHT HAND  Final   Special Requests   Final    BOTTLES DRAWN AEROBIC AND ANAEROBIC Blood Culture results may not be optimal due to an inadequate volume of blood received in culture bottles   Culture   Final    NO GROWTH 3 DAYS Performed at [facility], [place] [address] [place], [place] [address]    Report Status PENDING  Incomplete  Culture, blood (Routine X 2) w Reflex to ID Panel     Status: None (Preliminary result)    Collection Time: 10/16/21  3:34 PM   Specimen: BLOOD  Result Value Ref Range Status   Specimen Description BLOOD LEFT ANTECUBITAL  Final   Special Requests   Final    BOTTLES DRAWN AEROBIC AND ANAEROBIC Blood Culture results may not be optimal due to an inadequate volume of blood received in culture bottles   Culture   Final    NO GROWTH 3 DAYS Performed at [facility], [place] [address] [place], [place] [address]    Report Status PENDING  Incomplete      Radiology Studies: IR Fluoro Guide CV Line Left  Result Date: 10/19/2021 CLINICAL DATA:  End-stage renal disease. Recent removal of right-sided dialysis catheter on 10/15/2021 due to infection. EXAM: TUNNELED CENTRAL VENOUS HEMODIALYSIS CATHETER PLACEMENT WITH ULTRASOUND AND FLUOROSCOPIC GUIDANCE ANESTHESIA/SEDATION: Moderate (conscious) sedation was employed during this procedure. A total of Versed 1.0 mg and Fentanyl 50 mcg was administered intravenously by radiology nursing. Moderate Sedation Time: 27 minutes. The patient's level of consciousness and vital signs were monitored continuously by radiology nursing throughout the procedure under my direct supervision. MEDICATIONS: 1 g IV vancomycin. FLUOROSCOPY: 2.0 minutes.  70.0 mGy. PROCEDURE: The procedure, risks, benefits, and alternatives were explained to the patient. Questions regarding the procedure were encouraged and answered. The patient understands and consents to the procedure. A timeout was performed prior to initiating the procedure. Ultrasound was performed of both right and left neck. The left neck and chest were prepped with chlorhexidine in a sterile fashion, and a sterile drape was applied covering the operative field. Maximum barrier sterile technique with sterile gowns and gloves were used for the procedure. Local anesthesia was provided with 1% lidocaine. After creating a small venotomy incision, a 21 gauge needle was advanced into the  left internal jugular vein under  direct, real-time ultrasound guidance. Ultrasound image documentation was performed. After securing guidewire access, an 8 Fr dilator was placed. A J-wire was kinked to measure appropriate catheter length. A Palindrome tunneled hemodialysis catheter measuring 28 cm from tip to cuff was chosen for placement. This was tunneled in a retrograde fashion from the chest wall to the venotomy incision. At the venotomy, serial dilatation was performed and a 15 Fr peel-away sheath was placed over a guidewire. The catheter was then placed through the sheath and the sheath removed. Final catheter positioning was confirmed and documented with a fluoroscopic spot image. The catheter was aspirated, flushed with saline, and injected with appropriate volume heparin dwells. The venotomy incision was closed with subcuticular 4-0 Vicryl. Dermabond was applied to the incision. The catheter exit site was secured with 0-Prolene retention sutures. COMPLICATIONS: None.  No pneumothorax. 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 On the left side, a small internal jugular vein is identified. A true external jugular vein is not identified. Tortuous collateral veins are present. Due to body habitus, neither subclavian vein is able to be visualized by ultrasound. An ultrasound image was recorded of the left internal jugular vein. The left internal jugular vein was accessed under ultrasound guidance with a 21 gauge needle and micropuncture set and access ultimately secured to allow tunneled dialysis catheter placement. After catheter placement, the new tip lies in the right atrium. The catheter aspirates normally and is ready for immediate use. IMPRESSION: 1. 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 2. The only vein in the neck which  would allow dialysis catheter placement is the left internal jugular vein which was accessed for catheter placement today. This is likely the last tenable access in the neck. Neither subclavian vein is visible by ultrasound due to body habitus. 3. A new 28 cm tip to cuff length tunneled hemodialysis catheter was placed via the left internal jugular vein. The catheter tip lies in the right atrium. The catheter is ready for immediate use. Electronically Signed   By: Aletta Edouard M.D.   On: 10/19/2021 08:26   IR US Guide Vasc Access Left  Result Date: 10/19/2021 CLINICAL DATA:  End-stage renal disease. Recent removal of right-sided dialysis catheter on 10/15/2021 due to infection. EXAM: TUNNELED CENTRAL VENOUS HEMODIALYSIS CATHETER PLACEMENT WITH ULTRASOUND AND FLUOROSCOPIC GUIDANCE ANESTHESIA/SEDATION: Moderate (conscious) sedation was employed during this procedure. A total of Versed 1.0 mg and Fentanyl 50 mcg was administered intravenously by radiology nursing. Moderate Sedation Time: 27 minutes. The patient's level of consciousness and vital signs were monitored continuously by radiology nursing throughout the procedure under my direct supervision. MEDICATIONS: 1 g IV vancomycin. FLUOROSCOPY: 2.0 minutes.  70.0 mGy. PROCEDURE: The procedure, risks, benefits, and alternatives were explained to the patient. Questions regarding the procedure were encouraged and answered. The patient understands and consents to the procedure. A timeout was performed prior to initiating the procedure. Ultrasound was performed of both right and left neck. The left neck and chest were prepped with chlorhexidine in a sterile fashion, and a sterile drape was applied covering the operative field. Maximum barrier sterile technique with sterile gowns and gloves were used for the procedure. Local anesthesia was provided with 1% lidocaine. After creating a small venotomy incision, a 21 gauge needle was advanced into the left internal  jugular vein under direct, real-time ultrasound guidance. Ultrasound image documentation was performed. After securing guidewire access, an 8 Fr dilator was placed. A J-wire was kinked to measure appropriate catheter length. A Palindrome tunneled hemodialysis catheter measuring 28 cm from tip to cuff was chosen for placement. This was tunneled in a retrograde fashion from the chest wall to the venotomy incision. At the venotomy, serial dilatation was performed and a 15 Fr peel-away sheath was placed over a guidewire. The catheter was then placed through the sheath and the sheath removed. Final catheter positioning was confirmed and documented with a fluoroscopic spot image. The catheter was aspirated, flushed with saline, and injected with appropriate volume heparin dwells. The venotomy incision was closed with subcuticular 4-0 Vicryl. Dermabond was applied to the incision. The catheter exit site was secured with 0-Prolene retention sutures. COMPLICATIONS: None.  No pneumothorax. 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 On the left side, a small internal jugular vein is identified. A true external jugular vein is not identified. Tortuous collateral veins are present. Due to body habitus, neither subclavian vein is able to be visualized by ultrasound. An ultrasound image was recorded of the left internal jugular vein. The left internal jugular vein was accessed under ultrasound guidance with a 21 gauge needle and micropuncture set and access ultimately secured to allow tunneled dialysis catheter placement. After catheter placement, the new tip lies in the right atrium. The catheter aspirates normally and is ready for immediate use. IMPRESSION: 1. 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 2. The only vein in  the neck which would allow dialysis catheter placement is the left internal jugular vein which was accessed for catheter placement today. This is likely the last tenable access in the neck. Neither subclavian vein is visible by ultrasound due to body habitus. 3. A new 28 cm tip to cuff length tunneled hemodialysis catheter was placed via the left internal jugular vein. The catheter tip lies in the right atrium. The catheter is ready for immediate use. Electronically Signed   By: Aletta Edouard M.D.   On: 10/19/2021 08:26      Scheduled Meds:  amLODipine  10 mg Oral Daily   atorvastatin  10 mg Oral QHS   calcitRIOL  1 mcg Oral Q T,Th,Sa-HD   carvedilol  3.125 mg Oral BID WC   cinacalcet  30 mg Oral Q T,Th,Sa-HD   darbepoetin (ARANESP) injection - DIALYSIS  200 mcg Intravenous Q Sat-HD   docusate sodium  100 mg Oral Daily   feeding supplement  1 Container Oral TID BM   gabapentin  100 mg Oral Q12H   gabapentin  200 mg Oral Q T,Th,Sa-HD   Gerhardt's butt cream   Topical BID   heparin injection (subcutaneous)  5,000 Units Subcutaneous Q8H   metoCLOPramide (REGLAN) injection  5 mg Intravenous Q6H   metroNIDAZOLE  500 mg Oral Q12H   multivitamin  1 tablet Oral QHS   pantoprazole  40 mg Oral BID   polyethylene glycol  17 g Oral BID   sodium chloride flush  3 mL Intravenous Q12H   sodium chloride flush  5 mL Intracatheter Q8H   Continuous Infusions:  sodium chloride 10 mL/hr at 10/09/21 1410   ceFEPime (MAXIPIME) IV 2 g (10/15/21 1618)   magnesium sulfate bolus IVPB  LOS: 39 days     Dessa Phi, DO [facility] 10/19/2021, 1:17 PM   Available via Epic secure chat 7am-7pm After these hours, please refer to coverage provider listed on [email or web address]

## 2021-10-19 NOTE — Progress Notes (Signed)
Subjective: Seen on HD tolerating HD with new left IJ tunneled HD catheter by IR 7/14 yesterday, noted for NHP and has new OP HD center= [facility] on MWF 1245.  Objective Vital signs in last 24 hours: Vitals:   10/19/21 0930 10/19/21 1000 10/19/21 1100 10/19/21 1121  BP: (!) 123/51 (!) 101/48 (!) 107/56 113/68  Pulse: 85 81 82 79  Resp: 18 13 15 14   Temp:      TempSrc:      SpO2: 100% 98% 100% 100%  Weight:      Height:       Weight change:    Physical Exam: General: Obese chronically ill-appearing female, NAD Heart: RRR, no MRG Lungs: CTA on 3 L [place] O2 Abdomen: obese, NABS, S, NT ND, edema abdominal wall Extremities: Bilateral AKA with edema trace Dialysis Access: Left IJ TDC patent on HD     Dialysis Orders: [name] TTS 4h 46min, BFR 400, EDW 151 kg, 1K/2.5Ca bath, TDC Hep 2,600 load and 1,500 hourly-Total dose 9275 units per treatment.  - mircera 200ug q2 due 09/10/21 - calcitriol 1 ug tiw - sensipar 30 mg tiw   Problem/Plan: Septic shock: 6/17= BC 1 set  + Bacteroides Thetaiotaomicron/required short-term pressors / TEE. Neg./Found to have liver abscess and concern for splenic abscess. ID -s/p TDC (IR) and L TL (IV team) catheter removal 7/11  line holiday.  HD catheter replaced 7/14 left IJ by IR.   Remains on course of Cefepime with HD and oral metronidazole.  PAD s/p  R AKA, s/p amp.L mid. finger 09/23/2021. 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 Splenic abscess /Required splenic drain Ir 09/29/21 , culture NGTD  ESRD: Today back on HD per TTS schedule- /2 sepsis was on line holiday for 48-72 hours. Volume status and labs remain stable.  Continue 4 hour treatments instead of 4 hr 78min for now. Will ^ time back if labs worsen /or unable to remove adequate amounts of fluid. No heparin with HD.  Could potentially restart heparin next week Chronic hypoxic resp failure - At baseline, she is on 3L O2 at home. HTN/volume:  Optimize UF with HD. She has now reached her old EDW but need to lower  further. With NA 127 and  CXR repeated still shows vascular congestion.  Anemia of ESRD:  Am HGB 9.4 ,S/p 6 units PRBCs , Current ESA schedule off d/t line holiday. Continue Aranesp 200 mcg.  Q. Sat HD Sec.Hyperparathyroidism: CorrCa ok /Phos  3.8  requesting to hold binder due to constipation / Continue VDRA + sensipar.  T2DM- per primary team Malnutrition in setting of Morbid Obesity - Albumin 2.2, on protein supplements.    Ernest Haber, PA-C [facility] Beeper [phone] 10/19/2021,11:38 AM  LOS: 39 days   Labs: Basic Metabolic Panel: Recent Labs  Lab 10/15/21 0421 10/17/21 0222 10/18/21 0413 10/19/21 0800  NA 130* 127* 130* 127*  K 3.8 4.7 3.5 3.6  CL 94* 93* 94* 93*  CO2 24 26 23  21*  GLUCOSE 91 119* 82 94  BUN 32* 29* 30* 37*  CREATININE 6.31* 3.75* 7.01* 8.23*  CALCIUM 7.6* 8.2* 7.8* 7.6*  PHOS 5.7* 3.8  --   --    Liver Function Tests: Recent Labs  Lab 10/15/21 0421 10/17/21 0222  ALBUMIN 2.0* 2.2*   No results for input(s): "LIPASE", "AMYLASE" in the last 168 hours. No results for input(s): "AMMONIA" in the last 168 hours. CBC: Recent Labs  Lab 10/14/21 0353 10/14/21 1540 10/15/21 0421 10/17/21  0222 10/18/21 0413 10/19/21 0800  WBC 14.3*  --  15.8* 9.9 12.0* 13.5*  NEUTROABS  --   --   --  5.9  --   --   HGB 6.4*   < > 9.5* 7.5* 9.3* 9.4*  HCT 22.4*   < > 30.7* 23.4* 29.5* 29.6*  MCV 99.6  --  93.6 94.0 93.1 91.6  PLT 158  --  199 210 187 209   < > = values in this interval not displayed.   Cardiac Enzymes: No results for input(s): "CKTOTAL", "CKMB", "CKMBINDEX", "TROPONINI" in the last 168 hours. CBG: Recent Labs  Lab 10/17/21 1652 10/17/21 2139 10/18/21 0747 10/18/21 1119 10/18/21 2100  GLUCAP 103* 98 86 85 68*    Studies/Results: IR Fluoro Guide CV Line Left  Result Date: 10/19/2021 CLINICAL DATA:  End-stage renal disease. Recent removal of right-sided dialysis catheter on 10/15/2021 due to infection. EXAM: TUNNELED  CENTRAL VENOUS HEMODIALYSIS CATHETER PLACEMENT WITH ULTRASOUND AND FLUOROSCOPIC GUIDANCE ANESTHESIA/SEDATION: Moderate (conscious) sedation was employed during this procedure. A total of Versed 1.0 mg and Fentanyl 50 mcg was administered intravenously by radiology nursing. Moderate Sedation Time: 27 minutes. The patient's level of consciousness and vital signs were monitored continuously by radiology nursing throughout the procedure under my direct supervision. MEDICATIONS: 1 g IV vancomycin. FLUOROSCOPY: 2.0 minutes.  70.0 mGy. PROCEDURE: The procedure, risks, benefits, and alternatives were explained to the patient. Questions regarding the procedure were encouraged and answered. The patient understands and consents to the procedure. A timeout was performed prior to initiating the procedure. Ultrasound was performed of both right and left neck. The left neck and chest were prepped with chlorhexidine in a sterile fashion, and a sterile drape was applied covering the operative field. Maximum barrier sterile technique with sterile gowns and gloves were used for the procedure. Local anesthesia was provided with 1% lidocaine. After creating a small venotomy incision, a 21 gauge needle was advanced into the left internal jugular vein under direct, real-time ultrasound guidance. Ultrasound image documentation was performed. After securing guidewire access, an 8 Fr dilator was placed. A J-wire was kinked to measure appropriate catheter length. A Palindrome tunneled hemodialysis catheter measuring 28 cm from tip to cuff was chosen for placement. This was tunneled in a retrograde fashion from the chest wall to the venotomy incision. At the venotomy, serial dilatation was performed and a 15 Fr peel-away sheath was placed over a guidewire. The catheter was then placed through the sheath and the sheath removed. Final catheter positioning was confirmed and documented with a fluoroscopic spot image. The catheter was aspirated,  flushed with saline, and injected with appropriate volume heparin dwells. The venotomy incision was closed with subcuticular 4-0 Vicryl. Dermabond was applied to the incision. The catheter exit site was secured with 0-Prolene retention sutures. COMPLICATIONS: None.  No pneumothorax. 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 On the left side, a small internal jugular vein is identified. A true external jugular vein is not identified. Tortuous collateral veins are present. Due to body habitus, neither subclavian vein is able to be visualized by ultrasound. An ultrasound image was recorded of the left internal jugular vein. The left internal jugular vein was accessed under ultrasound guidance with a 21 gauge needle and micropuncture set and access ultimately secured to allow tunneled dialysis catheter placement. After catheter placement, the new tip lies  in the right atrium. The catheter aspirates normally and is ready for immediate use. IMPRESSION: 1. 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 2. The only vein in the neck which would allow dialysis catheter placement is the left internal jugular vein which was accessed for catheter placement today. This is likely the last tenable access in the neck. Neither subclavian vein is visible by ultrasound due to body habitus. 3. A new 28 cm tip to cuff length tunneled hemodialysis catheter was placed via the left internal jugular vein. The catheter tip lies in the right atrium. The catheter is ready for immediate use. Electronically Signed   By: Aletta Edouard M.D.   On: 10/19/2021 08:26   IR US Guide Vasc Access Left  Result Date: 10/19/2021 CLINICAL DATA:  End-stage renal disease. Recent removal of right-sided dialysis catheter on 10/15/2021 due to infection.  EXAM: TUNNELED CENTRAL VENOUS HEMODIALYSIS CATHETER PLACEMENT WITH ULTRASOUND AND FLUOROSCOPIC GUIDANCE ANESTHESIA/SEDATION: Moderate (conscious) sedation was employed during this procedure. A total of Versed 1.0 mg and Fentanyl 50 mcg was administered intravenously by radiology nursing. Moderate Sedation Time: 27 minutes. The patient's level of consciousness and vital signs were monitored continuously by radiology nursing throughout the procedure under my direct supervision. MEDICATIONS: 1 g IV vancomycin. FLUOROSCOPY: 2.0 minutes.  70.0 mGy. PROCEDURE: The procedure, risks, benefits, and alternatives were explained to the patient. Questions regarding the procedure were encouraged and answered. The patient understands and consents to the procedure. A timeout was performed prior to initiating the procedure. Ultrasound was performed of both right and left neck. The left neck and chest were prepped with chlorhexidine in a sterile fashion, and a sterile drape was applied covering the operative field. Maximum barrier sterile technique with sterile gowns and gloves were used for the procedure. Local anesthesia was provided with 1% lidocaine. After creating a small venotomy incision, a 21 gauge needle was advanced into the left internal jugular vein under direct, real-time ultrasound guidance. Ultrasound image documentation was performed. After securing guidewire access, an 8 Fr dilator was placed. A J-wire was kinked to measure appropriate catheter length. A Palindrome tunneled hemodialysis catheter measuring 28 cm from tip to cuff was chosen for placement. This was tunneled in a retrograde fashion from the chest wall to the venotomy incision. At the venotomy, serial dilatation was performed and a 15 Fr peel-away sheath was placed over a guidewire. The catheter was then placed through the sheath and the sheath removed. Final catheter positioning was confirmed and documented with a fluoroscopic spot image. The catheter  was aspirated, flushed with saline, and injected with appropriate volume heparin dwells. The venotomy incision was closed with subcuticular 4-0 Vicryl. Dermabond was applied to the incision. The catheter exit site was secured with 0-Prolene retention sutures. COMPLICATIONS: None.  No pneumothorax. 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 On the left side, a small internal jugular vein is identified. A true external jugular vein is not identified. Tortuous collateral veins are present. Due to body habitus, neither subclavian vein is able to be visualized by ultrasound. An ultrasound image was recorded of the left internal jugular vein. The left internal jugular vein was accessed under ultrasound guidance with a 21 gauge needle and micropuncture set and access ultimately secured to allow tunneled dialysis catheter placement. After catheter placement, the new tip lies in the  right atrium. The catheter aspirates normally and is ready for immediate use. IMPRESSION: 1. 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 2. The only vein in the neck which would allow dialysis catheter placement is the left internal jugular vein which was accessed for catheter placement today. This is likely the last tenable access in the neck. Neither subclavian vein is visible by ultrasound due to body habitus. 3. A new 28 cm tip to cuff length tunneled hemodialysis catheter was placed via the left internal jugular vein. The catheter tip lies in the right atrium. The catheter is ready for immediate use. Electronically Signed   By: Aletta Edouard M.D.   On: 10/19/2021 08:26   Medications:  sodium chloride 10 mL/hr at 10/09/21 1410   ceFEPime (MAXIPIME) IV 2 g (10/15/21 1618)   magnesium sulfate bolus IVPB      amLODipine  10 mg Oral  Daily   atorvastatin  10 mg Oral QHS   calcitRIOL  1 mcg Oral Q T,Th,Sa-HD   carvedilol  3.125 mg Oral BID WC   cinacalcet  30 mg Oral Q T,Th,Sa-HD   darbepoetin (ARANESP) injection - DIALYSIS  200 mcg Intravenous Q Sat-HD   docusate sodium  100 mg Oral Daily   feeding supplement  1 Container Oral TID BM   gabapentin  100 mg Oral Q12H   gabapentin  200 mg Oral Q T,Th,Sa-HD   Gerhardt's butt cream   Topical BID   heparin injection (subcutaneous)  5,000 Units Subcutaneous Q8H   metoCLOPramide (REGLAN) injection  5 mg Intravenous Q6H   metroNIDAZOLE  500 mg Oral Q12H   multivitamin  1 tablet Oral QHS   pantoprazole  40 mg Oral BID   polyethylene glycol  17 g Oral BID   sodium chloride flush  3 mL Intravenous Q12H   sodium chloride flush  5 mL Intracatheter Q8H

## 2021-10-19 NOTE — Procedures (Signed)
HD Note  Courtney Gray was unable to finish her perscribed dialysis treatment today due to discomfort and irritation with the sounds from the monitor and the dialysis machine.  She did sign the AMA form after receiving education from this writer that it would be in her best interest to continue the treatment. She expressed that she wanted to sleep during the treatment.  The monitor was indicating that she had apnea when she went to sleep and it would alarm. The treatment lasted 2.5 hours and the UF removal was 1100. She received her ordered Calcitrol and Aranesp.  Her splenic drain remained intact and was unremarkable.

## 2021-10-20 DIAGNOSIS — D735 Infarction of spleen: Secondary | ICD-10-CM | POA: Diagnosis not present

## 2021-10-20 LAB — CBC
HCT: 29.4 % — ABNORMAL LOW (ref 36.0–46.0)
Hemoglobin: 9.4 g/dL — ABNORMAL LOW (ref 12.0–15.0)
MCH: 28.7 pg (ref 26.0–34.0)
MCHC: 32 g/dL (ref 30.0–36.0)
MCV: 89.9 fL (ref 80.0–100.0)
Platelets: 175 10*3/uL (ref 150–400)
RBC: 3.27 MIL/uL — ABNORMAL LOW (ref 3.87–5.11)
RDW: 19.6 % — ABNORMAL HIGH (ref 11.5–15.5)
WBC: 13.7 10*3/uL — ABNORMAL HIGH (ref 4.0–10.5)
nRBC: 0 % (ref 0.0–0.2)

## 2021-10-20 LAB — BASIC METABOLIC PANEL
Anion gap: 13 (ref 5–15)
BUN: 29 mg/dL — ABNORMAL HIGH (ref 6–20)
CO2: 22 mmol/L (ref 22–32)
Calcium: 7.8 mg/dL — ABNORMAL LOW (ref 8.9–10.3)
Chloride: 96 mmol/L — ABNORMAL LOW (ref 98–111)
Creatinine, Ser: 6.84 mg/dL — ABNORMAL HIGH (ref 0.44–1.00)
GFR, Estimated: 7 mL/min — ABNORMAL LOW (ref >60)
Glucose, Bld: 90 mg/dL (ref 70–99)
Potassium: 4.2 mmol/L (ref 3.5–5.1)
Sodium: 131 mmol/L — ABNORMAL LOW (ref 135–145)

## 2021-10-20 LAB — GLUCOSE, CAPILLARY
Glucose-Capillary: 133 mg/dL — ABNORMAL HIGH (ref 70–99)
Glucose-Capillary: 105 mg/dL — ABNORMAL HIGH (ref 70–99)

## 2021-10-20 MED ORDER — CHLORHEXIDINE GLUCONATE CLOTH 2 % EX PADS: 6.0000 | MEDICATED_PAD | Freq: Every day | CUTANEOUS | Status: DC

## 2021-10-20 NOTE — Progress Notes (Signed)
Subjective: Courtney Gray had  to sign off dialysis yesterday because machine made too much noise, "could not sleep."  I stressed compliance.  Noted outpatient dialysis MWF [place].  She is stable by renal standpoint for discharge  Objective Vital signs in last 24 hours: Vitals:   10/19/21 1230 10/19/21 1648 10/19/21 2115 10/20/21 0539  BP: 138/64 (!) 152/83 (!) 144/67 (!) 141/64  Pulse: 87 89 87 89  Resp: 17 19 18 18   Temp: 98.4 F (36.9 C) 98.3 F (36.8 C) 98.6 F (37 C) 98.9 F (37.2 C)  TempSrc:   Oral Oral  SpO2: 100% 100% 96% 96%  Weight:    (!) 148 kg  Height:       Weight change:   Physical Exam: General: Obese chronically ill-appearing female, NAD Heart: RRR, no MRG Lungs: CTA on 3 L [place] O2 Abdomen: obese, NABS, S, NT ND, edema abdominal wall , left quadrant drain catheter (perisplenic abscess drain) Extremities: Bilateral AKA with edema trace Dialysis Access: Left IJ TDC dressing dry clear     Dialysis Orders: [name] TTS 4h 72min, BFR 400, EDW 151 kg, 1K/2.5Ca bath, TDC Hep 2,600 load and 1,500 hourly-Total dose 9275 units per treatment.  - mircera 200ug q2 due 09/10/21 - calcitriol 1 ug tiw - sensipar 30 mg tiw   Problem/Plan: Septic shock: 6/17= BC 1 set  + Bacteroides Thetaiotaomicron/required short-term pressors / TEE. Neg./Found to have liver abscess and concern for splenic abscess. ID -s/p TDC (IR) and L TL (IV team) PermCath catheter removal 7/11  line holiday.  HD catheter replaced 7/14 left IJ by IR.   Remains on course of Cefepime with HD and oral metronidazole.  Still has a drain line from perisplenic abscess PAD s/p  R AKA, s/p amp.L mid. finger 09/23/2021. 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 Splenic abscess /Required splenic drain Ir 09/29/21 plan per admit team ESRD: HD 7/15 after HD line holiday  2/2 sepsis was on line holiday for 48-72 hours.  New schedule M[facility] tomorrow.  OP time 4 hr 64min.  Tomorrow 4 hr 39min 2/2 previous signed off early. No heparin  with HD.  Could potentially restart heparin next week Chronic hypoxic resp failure - At baseline, she is on 3L O2 at home. HTN/volume:  Optimize UF with HD. She has now reached her old EDW ( BUT BED WTS)  NA 131 UF as tolerated Anemia of ESRD:  Am HGB 9.4 ,S/p 6 units PRBCs , Current ESA schedule off d/t line holiday. Continue Aranesp 200 mcg.  Q. Sat HD Sec.Hyperparathyroidism: CorrCa ok /Phos  3.8  requesting to hold binder due to constipation / Continue VDRA + sensipar.  T2DM- per primary team Malnutrition in setting of Morbid Obesity - Albumin 2.2, on protein supplements  Ernest Haber, PA-C [place] [phone] 10/20/2021,12:38 PM  LOS: 40 days   Labs: Basic Metabolic Panel: Recent Labs  Lab 10/15/21 0421 10/17/21 0222 10/18/21 0413 10/19/21 0800 10/20/21 0129  NA 130* 127* 130* 127* 131*  K 3.8 4.7 3.5 3.6 4.2  CL 94* 93* 94* 93* 96*  CO2 24 26 23  21* 22  GLUCOSE 91 119* 82 94 90  BUN 32* 29* 30* 37* 29*  CREATININE 6.31* 3.75* 7.01* 8.23* 6.84*  CALCIUM 7.6* 8.2* 7.8* 7.6* 7.8*  PHOS 5.7* 3.8  --   --   --    Liver Function Tests: Recent Labs  Lab 10/15/21 0421 10/17/21 0222  ALBUMIN 2.0* 2.2*  No results for input(s): "LIPASE", "AMYLASE" in the last 168 hours. No results for input(s): "AMMONIA" in the last 168 hours. CBC: Recent Labs  Lab 10/15/21 0421 10/17/21 0222 10/18/21 0413 10/19/21 0800 10/20/21 0129  WBC 15.8* 9.9 12.0* 13.5* 13.7*  NEUTROABS  --  5.9  --   --   --   HGB 9.5* 7.5* 9.3* 9.4* 9.4*  HCT 30.7* 23.4* 29.5* 29.6* 29.4*  MCV 93.6 94.0 93.1 91.6 89.9  PLT 199 210 187 209 175   Cardiac Enzymes: No results for input(s): "CKTOTAL", "CKMB", "CKMBINDEX", "TROPONINI" in the last 168 hours. CBG: Recent Labs  Lab 10/18/21 0747 10/18/21 1119 10/18/21 2100 10/19/21 1229 10/19/21 2116  GLUCAP 86 85 67* 101* 138*    Studies/Results: IR Fluoro Guide CV Line Left  Result Date: 10/19/2021 CLINICAL DATA:   End-stage renal disease. Recent removal of right-sided dialysis catheter on 10/15/2021 due to infection. EXAM: TUNNELED CENTRAL VENOUS HEMODIALYSIS CATHETER PLACEMENT WITH ULTRASOUND AND FLUOROSCOPIC GUIDANCE ANESTHESIA/SEDATION: Moderate (conscious) sedation was employed during this procedure. A total of Versed 1.0 mg and Fentanyl 50 mcg was administered intravenously by radiology nursing. Moderate Sedation Time: 27 minutes. The patient's level of consciousness and vital signs were monitored continuously by radiology nursing throughout the procedure under my direct supervision. MEDICATIONS: 1 g IV vancomycin. FLUOROSCOPY: 2.0 minutes.  70.0 mGy. PROCEDURE: The procedure, risks, benefits, and alternatives were explained to the patient. Questions regarding the procedure were encouraged and answered. The patient understands and consents to the procedure. A timeout was performed prior to initiating the procedure. Ultrasound was performed of both right and left neck. The left neck and chest were prepped with chlorhexidine in a sterile fashion, and a sterile drape was applied covering the operative field. Maximum barrier sterile technique with sterile gowns and gloves were used for the procedure. Local anesthesia was provided with 1% lidocaine. After creating a small venotomy incision, a 21 gauge needle was advanced into the left internal jugular vein under direct, real-time ultrasound guidance. Ultrasound image documentation was performed. After securing guidewire access, an 8 Fr dilator was placed. A J-wire was kinked to measure appropriate catheter length. A Palindrome tunneled hemodialysis catheter measuring 28 cm from tip to cuff was chosen for placement. This was tunneled in a retrograde fashion from the chest wall to the venotomy incision. At the venotomy, serial dilatation was performed and a 15 Fr peel-away sheath was placed over a guidewire. The catheter was then placed through the sheath and the sheath  removed. Final catheter positioning was confirmed and documented with a fluoroscopic spot image. The catheter was aspirated, flushed with saline, and injected with appropriate volume heparin dwells. The venotomy incision was closed with subcuticular 4-0 Vicryl. Dermabond was applied to the incision. The catheter exit site was secured with 0-Prolene retention sutures. COMPLICATIONS: None.  No pneumothorax. 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 On the left side, a small internal jugular vein is identified. A true external jugular vein is not identified. Tortuous collateral veins are present. Due to body habitus, neither subclavian vein is able to be visualized by ultrasound. An ultrasound image was recorded of the left internal jugular vein. The left internal jugular vein was accessed under ultrasound guidance with a 21 gauge needle and micropuncture set and access ultimately secured to allow tunneled dialysis catheter placement. After catheter placement, the new tip lies  in the right atrium. The catheter aspirates normally and is ready for immediate use. IMPRESSION: 1. 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 2. The only vein in the neck which would allow dialysis catheter placement is the left internal jugular vein which was accessed for catheter placement today. This is likely the last tenable access in the neck. Neither subclavian vein is visible by ultrasound due to body habitus. 3. A new 28 cm tip to cuff length tunneled hemodialysis catheter was placed via the left internal jugular vein. The catheter tip lies in the right atrium. The catheter is ready for immediate use. Electronically Signed   By: Aletta Edouard M.D.   On: 10/19/2021 08:26   IR US Guide Vasc Access Left  Result Date:  10/19/2021 CLINICAL DATA:  End-stage renal disease. Recent removal of right-sided dialysis catheter on 10/15/2021 due to infection. EXAM: TUNNELED CENTRAL VENOUS HEMODIALYSIS CATHETER PLACEMENT WITH ULTRASOUND AND FLUOROSCOPIC GUIDANCE ANESTHESIA/SEDATION: Moderate (conscious) sedation was employed during this procedure. A total of Versed 1.0 mg and Fentanyl 50 mcg was administered intravenously by radiology nursing. Moderate Sedation Time: 27 minutes. The patient's level of consciousness and vital signs were monitored continuously by radiology nursing throughout the procedure under my direct supervision. MEDICATIONS: 1 g IV vancomycin. FLUOROSCOPY: 2.0 minutes.  70.0 mGy. PROCEDURE: The procedure, risks, benefits, and alternatives were explained to the patient. Questions regarding the procedure were encouraged and answered. The patient understands and consents to the procedure. A timeout was performed prior to initiating the procedure. Ultrasound was performed of both right and left neck. The left neck and chest were prepped with chlorhexidine in a sterile fashion, and a sterile drape was applied covering the operative field. Maximum barrier sterile technique with sterile gowns and gloves were used for the procedure. Local anesthesia was provided with 1% lidocaine. After creating a small venotomy incision, a 21 gauge needle was advanced into the left internal jugular vein under direct, real-time ultrasound guidance. Ultrasound image documentation was performed. After securing guidewire access, an 8 Fr dilator was placed. A J-wire was kinked to measure appropriate catheter length. A Palindrome tunneled hemodialysis catheter measuring 28 cm from tip to cuff was chosen for placement. This was tunneled in a retrograde fashion from the chest wall to the venotomy incision. At the venotomy, serial dilatation was performed and a 15 Fr peel-away sheath was placed over a guidewire. The catheter was then placed through the  sheath and the sheath removed. Final catheter positioning was confirmed and documented with a fluoroscopic spot image. The catheter was aspirated, flushed with saline, and injected with appropriate volume heparin dwells. The venotomy incision was closed with subcuticular 4-0 Vicryl. Dermabond was applied to the incision. The catheter exit site was secured with 0-Prolene retention sutures. COMPLICATIONS: None.  No pneumothorax. 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 On the left side, a small internal jugular vein is identified. A true external jugular vein is not identified. Tortuous collateral veins are present. Due to body habitus, neither subclavian vein is able to be visualized by ultrasound. An ultrasound image was recorded of the left internal jugular vein. The left internal jugular vein was accessed under ultrasound guidance with a 21 gauge needle and micropuncture set and access ultimately secured to allow tunneled dialysis catheter placement. After catheter placement, the new tip lies in the  right atrium. The catheter aspirates normally and is ready for immediate use. IMPRESSION: 1. 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 2. The only vein in the neck which would allow dialysis catheter placement is the left internal jugular vein which was accessed for catheter placement today. This is likely the last tenable access in the neck. Neither subclavian vein is visible by ultrasound due to body habitus. 3. A new 28 cm tip to cuff length tunneled hemodialysis catheter was placed via the left internal jugular vein. The catheter tip lies in the right atrium. The catheter is ready for immediate use. Electronically Signed   By: Aletta Edouard M.D.   On: 10/19/2021 08:26   Medications:  sodium chloride  10 mL/hr at 10/09/21 1410   ceFEPime (MAXIPIME) IV 2 g (10/19/21 1707)   magnesium sulfate bolus IVPB      amLODipine  10 mg Oral Daily   atorvastatin  10 mg Oral QHS   calcitRIOL  1 mcg Oral Q T,Th,Sa-HD   carvedilol  3.125 mg Oral BID WC   cinacalcet  30 mg Oral Q T,Th,Sa-HD   darbepoetin (ARANESP) injection - DIALYSIS  200 mcg Intravenous Q Sat-HD   docusate sodium  100 mg Oral Daily   feeding supplement  1 Container Oral TID BM   gabapentin  100 mg Oral Q12H   gabapentin  200 mg Oral Q T,Th,Sa-HD   Gerhardt's butt cream   Topical BID   heparin injection (subcutaneous)  5,000 Units Subcutaneous Q8H   metoCLOPramide (REGLAN) injection  5 mg Intravenous Q6H   metroNIDAZOLE  500 mg Oral Q12H   multivitamin  1 tablet Oral QHS   pantoprazole  40 mg Oral BID   polyethylene glycol  17 g Oral BID   sodium chloride flush  3 mL Intravenous Q12H   sodium chloride flush  5 mL Intracatheter Q8H

## 2021-10-20 NOTE — Progress Notes (Signed)
PROGRESS NOTE    Courtney Gray  DGU:[id number] DOB: [birth year] DOA: 09/09/2021 PCP: Monico Blitz, MD     Brief Narrative:  Courtney Gray is a 51 year old female with end-stage renal disease, chronic HFpEF with grade 2 diastolic dysfunction, insulin requiring diabetes mellitus, morbid obesity chronically on 2 L of oxygen, peripheral vascular disease status post right AKA on 5/10 for critical limb ischemia. The patient presented to the ED on 09/09/2021 with severe left upper quadrant pain and CT revealed a splenic infarct.  She was started on IV heparin and briefly required pressors for shock.  6/17 the patient grew out Bacteroides Thetaiotaomicron in anaerobic bottle of one set 6/19 amputation of the third digit on the left hand for gangrene 6/23 TEE was negative for endocarditis-calcified mass noted in right atrium of unclear etiology-CT revealed liver abscess and subcapsular splenic hematoma-Eliquis held 6/25 CT-guided drainage of liver abscess-culture negative- drain removed on 7/3 7/5-left AKA for left first and second gangrene 7/7-drain placed into splenic abscess with 350 cc of pus- culture NGTD 7/11-line holiday, TDC removed  7/14-left TDC replaced (noted IR states this is likely LAST tenable access and would only remove catheter in future if absolutely imperative)   New events last 24 hours / Subjective: Patient complains of being sleepy/tired all the time, she thinks it's related to her antibiotics. 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  Receiving cefepime with dialysis + PO flagyl, end date 11/08/2021 -Tunneled dialysis catheter and left TL catheter removed removed 7/11 in setting of persistent leukocytosis to give line holiday -Replace tunneled dialysis catheter 7/14 -Repeat blood culture drawn 7/12 is negative growth to date -WBC 13.7 today. Discussed over the phone with Dr. Candiss Norse, she feels it's ok to monitor WBC for now. No new recommendations unless WBC significantly elevated.   Hepatic and splenic abscesses -Abscess culture showing Staph epidermidis, pansensitive -Continue to monitor drain output from left upper quadrant splenic drain 10 cc yesterday   Anemia of chronic disease -Received 1 unit packed red blood cells 7/10  ESRD on dialysis TTSa -Nephrology following  Uncontrolled type 2 diabetes mellitus with hypoglycemia -A1c 5.7 -Sliding scale insulin discontinued as patient has been refusing it  HTN -Norvasc, coreg - has been refusing these medications daily   HLD -Lipitor   PAD -Status post right AKA -Status post left AKA for gangrene.  Wound VAC removed.  Vascular surgery following.  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 pink (Active)  09/11/21 0000  Location: Buttocks  Location Orientation: Left  Staging: Stage 2 -  Partial thickness loss of dermis presenting as a shallow open injury with a red, pink wound bed without slough.  Wound Description (Comments): pink  Present on Admission: Yes  Dressing Type Foam - Lift dressing to assess site every shift 10/19/21 2024     Pressure Injury 09/11/21 Buttocks Right Stage 2 -  Partial thickness loss of dermis presenting as a shallow open injury with a red, pink wound bed without slough. pink (Active)  09/11/21 0000  Location: Buttocks  Location Orientation: Right  Staging: Stage 2 -  Partial thickness loss of dermis presenting as a shallow open injury with a red, pink wound bed without slough.  Wound Description (Comments): pink  Present on Admission: Yes  Dressing Type Foam - Lift dressing to assess site every shift 10/19/21 2024     Nutrition Problem: Inadequate oral intake Etiology: nausea   DVT prophylaxis:  heparin injection 5,000 Units Start: 10/15/21 1600 SCD's Start: 10/09/21 1713  Code Status: Full Family Communication: Daughter at bedside Disposition Plan:  Status is: Inpatient Remains inpatient appropriate because: Monitor WBC 1 more day.  If remains stable, plan for inpatient dialysis tomorrow and discharge to [facility] Monday.  She will need to start outpatient dialysis on Wednesday instead.  TOC notified.   Antimicrobials:  Anti-infectives (From admission, onward)    Start     Dose/Rate Route Frequency Ordered Stop   10/18/21 0000  vancomycin (VANCOCIN) IVPB 1000 mg/200 mL premix        1,000 mg 200 mL/hr over 60 Minutes Intravenous To Radiology 10/17/21 1712 10/18/21 2005   10/16/21 2200  metroNIDAZOLE (FLAGYL) tablet 500 mg        500 mg Oral Every 12  hours 10/16/21 1128 11/09/21 0959   09/25/21 0915  metroNIDAZOLE (FLAGYL) IVPB 500 mg  Status:  Discontinued        500 mg 100 mL/hr over 60 Minutes Intravenous Every 12 hours 09/25/21 0822 10/16/21 1128   09/13/21 1000  vancomycin (VANCOCIN) IVPB 1000 mg/200 mL premix        1,000 mg 200 mL/hr over 60 Minutes Intravenous STAT 09/13/21 0904 09/13/21 1140   09/10/21 1200  vancomycin (VANCOCIN) IVPB 1000 mg/200 mL premix  Status:  Discontinued        1,000 mg 200 mL/hr over 60 Minutes Intravenous Every T-Th-Sa (Hemodialysis) 09/10/21 0620 09/17/21 1013   09/10/21 1200  ceFEPIme (MAXIPIME) 2 g in sodium chloride 0.9 % 100 mL IVPB        2 g 200 mL/hr over 30 Minutes Intravenous Every T-Th-Sa (Hemodialysis) 09/10/21 1107 11/08/21 2359   09/10/21 0445  vancomycin (VANCOREADY) IVPB 2000 mg/400 mL        2,000 mg 200 mL/hr over 120 Minutes Intravenous  Once 09/10/21 0435 09/10/21 0800   09/10/21 0430  ceFEPIme (MAXIPIME) 2 g in sodium chloride 0.9 % 100 mL IVPB        2 g 200 mL/hr over 30 Minutes Intravenous  Once 09/10/21 0420 09/10/21 0550        Objective: Vitals:  10/19/21 1230 10/19/21 1648 10/19/21 2115 10/20/21 0539  BP: 138/64 (!) 152/83 (!) 144/67 (!) 141/64  Pulse: 87 89 87 89  Resp: 17 19 18 18   Temp: 98.4 F (36.9 C) 98.3 F (36.8 C) 98.6 F (37 C) 98.9 F (37.2 C)  TempSrc:   Oral Oral  SpO2: 100% 100% 96% 96%  Weight:    (!) 148 kg  Height:        Intake/Output Summary (Last 24 hours) at 10/20/2021 1237 Last data filed at 10/20/2021 0647 Gross per 24 hour  Intake 540 ml  Output 10 ml  Net 530 ml    Filed Weights   10/19/21 0738 10/19/21 1130 10/20/21 0539  Weight: (!) 147.8 kg (!) 145.3 kg (!) 148 kg    Examination:  General exam: Appears calm, morbidly obese Respiratory system: Clear to auscultation. Respiratory effort normal. No respiratory distress. No conversational dyspnea.  On room air Cardiovascular system: S1 & S2 heard, RRR. No murmurs.   Gastrointestinal system: Abdomen is nondistended, soft and nontender. Central nervous system: Alert and oriented. Extremities: Bilateral lower extremity AKA, left AKA incision is clean and dry, staples in place, without drainage or erythema. Some soreness to palpation which is expected, left middle finger amputation site looks clean without sign of infection     Data Reviewed: I have personally reviewed following labs and imaging studies  CBC: Recent Labs  Lab 10/15/21 0421 10/17/21 0222 10/18/21 0413 10/19/21 0800 10/20/21 0129  WBC 15.8* 9.9 12.0* 13.5* 13.7*  NEUTROABS  --  5.9  --   --   --   HGB 9.5* 7.5* 9.3* 9.4* 9.4*  HCT 30.7* 23.4* 29.5* 29.6* 29.4*  MCV 93.6 94.0 93.1 91.6 89.9  PLT 199 210 187 209 195    Basic Metabolic Panel: Recent Labs  Lab 10/15/21 0421 10/17/21 0222 10/18/21 0413 10/19/21 0800 10/20/21 0129  NA 130* 127* 130* 127* 131*  K 3.8 4.7 3.5 3.6 4.2  CL 94* 93* 94* 93* 96*  CO2 24 26 23  21* 22  GLUCOSE 91 119* 82 94 90  BUN 32* 29* 30* 37* 29*  CREATININE 6.31* 3.75* 7.01* 8.23* 6.84*  CALCIUM 7.6* 8.2* 7.8* 7.6* 7.8*  PHOS 5.7* 3.8  --   --   --     GFR: Estimated Creatinine Clearance: 14.3 mL/min (A) (by C-G formula based on SCr of 6.84 mg/dL (H)). Liver Function Tests: Recent Labs  Lab 10/15/21 0421 10/17/21 0222  ALBUMIN 2.0* 2.2*    No results for input(s): "LIPASE", "AMYLASE" in the last 168 hours. No results for input(s): "AMMONIA" in the last 168 hours. Coagulation Profile: No results for input(s): "INR", "PROTIME" in the last 168 hours.  Cardiac Enzymes: No results for input(s): "CKTOTAL", "CKMB", "CKMBINDEX", "TROPONINI" in the last 168 hours. BNP (last 3 results) No results for input(s): "PROBNP" in the last 8760 hours. HbA1C: No results for input(s): "HGBA1C" in the last 72 hours. CBG: Recent Labs  Lab 10/18/21 0747 10/18/21 1119 10/18/21 2100 10/19/21 1229 10/19/21 2116  GLUCAP 86 85 67* 101* 138*     Lipid Profile: No results for input(s): "CHOL", "HDL", "LDLCALC", "TRIG", "CHOLHDL", "LDLDIRECT" in the last 72 hours. Thyroid Function Tests: No results for input(s): "TSH", "T4TOTAL", "FREET4", "T3FREE", "THYROIDAB" in the last 72 hours. Anemia Panel: No results for input(s): "VITAMINB12", "FOLATE", "FERRITIN", "TIBC", "IRON", "RETICCTPCT" in the last 72 hours. Sepsis Labs: No results for input(s): "PROCALCITON", "LATICACIDVEN" in the last 168 hours.  Recent Results (from the past  240 hour(s))  Aerobic/Anaerobic Culture w Gram Stain (surgical/deep wound)     Status: None   Collection Time: 10/11/21  5:00 PM   Specimen: Abscess  Result Value Ref Range Status   Specimen Description ABSCESS  Final   Special Requests SPLEEN  Final   Gram Stain   Final    ABUNDANT WBC PRESENT,BOTH PMN AND MONONUCLEAR NO ORGANISMS SEEN    Culture   Final    RARE STAPHYLOCOCCUS EPIDERMIDIS NO ANAEROBES ISOLATED Performed at [facility], [address] [place], [place] [address]    Report Status 10/16/2021 FINAL  Final   Organism ID, Bacteria STAPHYLOCOCCUS EPIDERMIDIS  Final      Susceptibility   Staphylococcus epidermidis - MIC*    CIPROFLOXACIN <=0.5 SENSITIVE Sensitive     ERYTHROMYCIN <=0.25 SENSITIVE Sensitive     GENTAMICIN <=0.5 SENSITIVE Sensitive     OXACILLIN <=0.25 SENSITIVE Sensitive     TETRACYCLINE <=1 SENSITIVE Sensitive     VANCOMYCIN 1 SENSITIVE Sensitive     TRIMETH/SULFA <=10 SENSITIVE Sensitive     CLINDAMYCIN <=0.25 SENSITIVE Sensitive     RIFAMPIN <=0.5 SENSITIVE Sensitive     Inducible Clindamycin NEGATIVE Sensitive     * RARE STAPHYLOCOCCUS EPIDERMIDIS  Culture, blood (Routine X 2) w Reflex to ID Panel     Status: None (Preliminary result)   Collection Time: 10/16/21  3:13 PM   Specimen: BLOOD RIGHT HAND  Result Value Ref Range Status   Specimen Description BLOOD RIGHT HAND  Final   Special Requests   Final    BOTTLES DRAWN AEROBIC AND ANAEROBIC Blood  Culture results may not be optimal due to an inadequate volume of blood received in culture bottles   Culture   Final    NO GROWTH 4 DAYS Performed at [facility], [place] [address] [place], [place] [address]    Report Status PENDING  Incomplete  Culture, blood (Routine X 2) w Reflex to ID Panel     Status: None (Preliminary result)   Collection Time: 10/16/21  3:34 PM   Specimen: BLOOD  Result Value Ref Range Status   Specimen Description BLOOD LEFT ANTECUBITAL  Final   Special Requests   Final    BOTTLES DRAWN AEROBIC AND ANAEROBIC Blood Culture results may not be optimal due to an inadequate volume of blood received in culture bottles   Culture   Final    NO GROWTH 4 DAYS Performed at [facility], [place] [address] [place], [place] [address]    Report Status PENDING  Incomplete      Radiology Studies: IR Fluoro Guide CV Line Left  Result Date: 10/19/2021 CLINICAL DATA:  End-stage renal disease. Recent removal of right-sided dialysis catheter on 10/15/2021 due to infection. EXAM: TUNNELED CENTRAL VENOUS HEMODIALYSIS CATHETER PLACEMENT WITH ULTRASOUND AND FLUOROSCOPIC GUIDANCE ANESTHESIA/SEDATION: Moderate (conscious) sedation was employed during this procedure. A total of Versed 1.0 mg and Fentanyl 50 mcg was administered intravenously by radiology nursing. Moderate Sedation Time: 27 minutes. The patient's level of consciousness and vital signs were monitored continuously by radiology nursing throughout the procedure under my direct supervision. MEDICATIONS: 1 g IV vancomycin. FLUOROSCOPY: 2.0 minutes.  70.0 mGy. PROCEDURE: The procedure, risks, benefits, and alternatives were explained to the patient. Questions regarding the procedure were encouraged and answered. The patient understands and consents to the procedure. A timeout was performed prior to initiating the procedure. Ultrasound was performed of both right and left neck. The left neck and  chest were prepped with  chlorhexidine in a sterile fashion, and a sterile drape was applied covering the operative field. Maximum barrier sterile technique with sterile gowns and gloves were used for the procedure. Local anesthesia was provided with 1% lidocaine. After creating a small venotomy incision, a 21 gauge needle was advanced into the left internal jugular vein under direct, real-time ultrasound guidance. Ultrasound image documentation was performed. After securing guidewire access, an 8 Fr dilator was placed. A J-wire was kinked to measure appropriate catheter length. A Palindrome tunneled hemodialysis catheter measuring 28 cm from tip to cuff was chosen for placement. This was tunneled in a retrograde fashion from the chest wall to the venotomy incision. At the venotomy, serial dilatation was performed and a 15 Fr peel-away sheath was placed over a guidewire. The catheter was then placed through the sheath and the sheath removed. Final catheter positioning was confirmed and documented with a fluoroscopic spot image. The catheter was aspirated, flushed with saline, and injected with appropriate volume heparin dwells. The venotomy incision was closed with subcuticular 4-0 Vicryl. Dermabond was applied to the incision. The catheter exit site was secured with 0-Prolene retention sutures. COMPLICATIONS: None.  No pneumothorax. 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 On the left side, a small internal jugular vein is identified. A true external jugular vein is not identified. Tortuous collateral veins are present. Due to body habitus, neither subclavian vein is able to be visualized by ultrasound. An ultrasound image was recorded of the left internal jugular vein. The left internal jugular vein was accessed under ultrasound guidance with a 21 gauge needle and  micropuncture set and access ultimately secured to allow tunneled dialysis catheter placement. After catheter placement, the new tip lies in the right atrium. The catheter aspirates normally and is ready for immediate use. IMPRESSION: 1. 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 2. The only vein in the neck which would allow dialysis catheter placement is the left internal jugular vein which was accessed for catheter placement today. This is likely the last tenable access in the neck. Neither subclavian vein is visible by ultrasound due to body habitus. 3. A new 28 cm tip to cuff length tunneled hemodialysis catheter was placed via the left internal jugular vein. The catheter tip lies in the right atrium. The catheter is ready for immediate use. Electronically Signed   By: Aletta Edouard M.D.   On: 10/19/2021 08:26   IR US Guide Vasc Access Left  Result Date: 10/19/2021 CLINICAL DATA:  End-stage renal disease. Recent removal of right-sided dialysis catheter on 10/15/2021 due to infection. EXAM: TUNNELED CENTRAL VENOUS HEMODIALYSIS CATHETER PLACEMENT WITH ULTRASOUND AND FLUOROSCOPIC GUIDANCE ANESTHESIA/SEDATION: Moderate (conscious) sedation was employed during this procedure. A total of Versed 1.0 mg and Fentanyl 50 mcg was administered intravenously by radiology nursing. Moderate Sedation Time: 27 minutes. The patient's level of consciousness and vital signs were monitored continuously by radiology nursing throughout the procedure under my direct supervision. MEDICATIONS: 1 g IV vancomycin. FLUOROSCOPY: 2.0 minutes.  70.0 mGy. PROCEDURE: The procedure, risks, benefits, and alternatives were explained to the patient. Questions regarding the procedure were encouraged and answered. The patient understands and consents to the procedure. A timeout was performed prior to initiating the procedure. Ultrasound was performed of both right and left neck. The left neck and  chest  were prepped with chlorhexidine in a sterile fashion, and a sterile drape was applied covering the operative field. Maximum barrier sterile technique with sterile gowns and gloves were used for the procedure. Local anesthesia was provided with 1% lidocaine. After creating a small venotomy incision, a 21 gauge needle was advanced into the left internal jugular vein under direct, real-time ultrasound guidance. Ultrasound image documentation was performed. After securing guidewire access, an 8 Fr dilator was placed. A J-wire was kinked to measure appropriate catheter length. A Palindrome tunneled hemodialysis catheter measuring 28 cm from tip to cuff was chosen for placement. This was tunneled in a retrograde fashion from the chest wall to the venotomy incision. At the venotomy, serial dilatation was performed and a 15 Fr peel-away sheath was placed over a guidewire. The catheter was then placed through the sheath and the sheath removed. Final catheter positioning was confirmed and documented with a fluoroscopic spot image. The catheter was aspirated, flushed with saline, and injected with appropriate volume heparin dwells. The venotomy incision was closed with subcuticular 4-0 Vicryl. Dermabond was applied to the incision. The catheter exit site was secured with 0-Prolene retention sutures. COMPLICATIONS: None.  No pneumothorax. 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 On the left side, a small internal jugular vein is identified. A true external jugular vein is not identified. Tortuous collateral veins are present. Due to body habitus, neither subclavian vein is able to be visualized by ultrasound. An ultrasound image was recorded of the left internal jugular vein. The left internal jugular vein was accessed under ultrasound guidance with a 21  gauge needle and micropuncture set and access ultimately secured to allow tunneled dialysis catheter placement. After catheter placement, the new tip lies in the right atrium. The catheter aspirates normally and is ready for immediate use. IMPRESSION: 1. 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 2. The only vein in the neck which would allow dialysis catheter placement is the left internal jugular vein which was accessed for catheter placement today. This is likely the last tenable access in the neck. Neither subclavian vein is visible by ultrasound due to body habitus. 3. A new 28 cm tip to cuff length tunneled hemodialysis catheter was placed via the left internal jugular vein. The catheter tip lies in the right atrium. The catheter is ready for immediate use. Electronically Signed   By: Aletta Edouard M.D.   On: 10/19/2021 08:26      Scheduled Meds:  amLODipine  10 mg Oral Daily   atorvastatin  10 mg Oral QHS   calcitRIOL  1 mcg Oral Q T,Th,Sa-HD   carvedilol  3.125 mg Oral BID WC   cinacalcet  30 mg Oral Q T,Th,Sa-HD   darbepoetin (ARANESP) injection - DIALYSIS  200 mcg Intravenous Q Sat-HD   docusate sodium  100 mg Oral Daily   feeding supplement  1 Container Oral TID BM   gabapentin  100 mg Oral Q12H   gabapentin  200 mg Oral Q T,Th,Sa-HD   Gerhardt's butt cream   Topical BID   heparin injection (subcutaneous)  5,000 Units Subcutaneous Q8H   metoCLOPramide (REGLAN) injection  5 mg Intravenous Q6H   metroNIDAZOLE  500 mg Oral Q12H   multivitamin  1 tablet Oral QHS   pantoprazole  40 mg Oral BID   polyethylene glycol  17  g Oral BID   sodium chloride flush  3 mL Intravenous Q12H   sodium chloride flush  5 mL Intracatheter Q8H   Continuous Infusions:  sodium chloride 10 mL/hr at 10/09/21 1410   ceFEPime (MAXIPIME) IV 2 g (10/19/21 1707)   magnesium sulfate bolus IVPB       LOS: 40 days     Dessa Phi, DO [facility] 10/20/2021, 12:37 PM   Available via Epic secure chat 7am-7pm After these hours, please refer to coverage provider listed on [email or web address]

## 2021-10-21 LAB — CBC
HCT: 30.1 % — ABNORMAL LOW (ref 36.0–46.0)
Hemoglobin: 9.6 g/dL — ABNORMAL LOW (ref 12.0–15.0)
MCH: 29.7 pg (ref 26.0–34.0)
MCHC: 31.9 g/dL (ref 30.0–36.0)
MCV: 93.2 fL (ref 80.0–100.0)
Platelets: 204 10*3/uL (ref 150–400)
RBC: 3.23 MIL/uL — ABNORMAL LOW (ref 3.87–5.11)
RDW: 19.1 % — ABNORMAL HIGH (ref 11.5–15.5)
WBC: 12.4 10*3/uL — ABNORMAL HIGH (ref 4.0–10.5)
nRBC: 0.2 % (ref 0.0–0.2)

## 2021-10-21 LAB — CULTURE, BLOOD (ROUTINE X 2)
Culture: NO GROWTH
Culture: NO GROWTH

## 2021-10-21 LAB — BASIC METABOLIC PANEL
Anion gap: 14 (ref 5–15)
BUN: 34 mg/dL — ABNORMAL HIGH (ref 6–20)
CO2: 22 mmol/L (ref 22–32)
Calcium: 7.8 mg/dL — ABNORMAL LOW (ref 8.9–10.3)
Chloride: 95 mmol/L — ABNORMAL LOW (ref 98–111)
Creatinine, Ser: 8.03 mg/dL — ABNORMAL HIGH (ref 0.44–1.00)
GFR, Estimated: 6 mL/min — ABNORMAL LOW (ref >60)
Glucose, Bld: 79 mg/dL (ref 70–99)
Potassium: 3.8 mmol/L (ref 3.5–5.1)
Sodium: 131 mmol/L — ABNORMAL LOW (ref 135–145)

## 2021-10-21 LAB — GLUCOSE, CAPILLARY
Glucose-Capillary: 81 mg/dL (ref 70–99)
Glucose-Capillary: 75 mg/dL (ref 70–99)
Glucose-Capillary: 139 mg/dL — ABNORMAL HIGH (ref 70–99)

## 2021-10-21 MED ORDER — OXYCODONE HCL 5 MG PO TABS
5.0000 mg | ORAL_TABLET | ORAL | 0 refills | Status: DC | PRN
Start: 2021-10-21 — End: 2021-10-21

## 2021-10-21 MED ORDER — SODIUM CHLORIDE 0.9 % IV SOLN: 2.0000 g | INTRAVENOUS | Status: DC

## 2021-10-21 MED ORDER — METRONIDAZOLE 500 MG PO TABS: 500.0000 mg | ORAL_TABLET | Freq: Two times a day (BID) | ORAL | 0 refills | Status: DC

## 2021-10-21 MED ORDER — OXYCODONE HCL 5 MG PO TABS: 5.0000 mg | ORAL_TABLET | ORAL | 0 refills | Status: DC | PRN

## 2021-10-21 NOTE — Progress Notes (Signed)
Pt discharged to [facility]. Copy of AVS placed in discharge envelope and another given to patient. Attempted calling report to nursing home x 3; but no one picked up. Pt left unit on stretcher pushed by ambulance staff. Left in stable condition.

## 2021-10-21 NOTE — Progress Notes (Signed)
PT Cancellation Note  Patient Details Name: Courtney Gray MRN: [id number] DOB: 1970-08-28   Cancelled Treatment:    Reason Eval/Treat Not Completed: (P) Patient at procedure or test/unavailable Pt is off the floor for HD. PT will follow back for treatment this afternoon as able.  Shadasia Oldfield B. Migdalia Dk PT, DPT Acute Rehabilitation Services Please use secure chat or  Call Office [phone]    [place] 10/21/2021, 8:30 AM

## 2021-10-21 NOTE — Discharge Summary (Signed)
Physician Discharge Summary  Courtney Gray YTK:[id number] DOB: [birth year] DOA: 09/09/2021  PCP: Monico Blitz, MD  Admit date: 09/09/2021 Discharge date: 10/21/2021  Admitted From: Home Disposition:  SNF   Recommendations for Outpatient Follow-up:  Follow up with PCP in 1 week Follow-up with IR for splenic abscess drain management.  They will arrange. Follow-up with Dr. Stanford Breed vascular surgery for left AKA in 1 month for staple removal.  They will arrange. Follow-up with Dr. Tempie Donning orthopedic surgery as needed for left middle finger amputation Follow-up with Dr. Candiss Norse infectious disease 7/28  Discharge Condition: Stable CODE STATUS: Full  Diet recommendation: Renal diet   Brief/Interim Summary: Courtney Gray is a 51 year old female with end-stage renal disease, chronic HFpEF with grade 2 diastolic dysfunction, insulin requiring diabetes mellitus, morbid obesity chronically on 2 L of oxygen, peripheral vascular disease status post right AKA on 5/10 for critical limb ischemia. The patient presented to the ED on 09/09/2021 with severe left upper quadrant pain and CT revealed a splenic infarct.  She was started on IV heparin and briefly required pressors for shock.  6/17 the patient grew out Bacteroides Thetaiotaomicron in anaerobic bottle of one set 6/19 amputation of the third digit on the left hand for gangrene 6/23 TEE was negative for endocarditis-calcified mass noted in right atrium of unclear etiology-CT revealed liver abscess and subcapsular splenic hematoma-Eliquis held 6/25 CT-guided drainage of liver abscess-culture negative- drain removed on 7/3 7/5-left AKA for left first and second gangrene 7/7-drain placed into splenic abscess with 350 cc of pus- culture NGTD 7/11-line holiday, TDC removed  7/14-left TDC replaced (noted IR states this is likely LAST tenable access and would only remove catheter in future if absolutely imperative)   Discharge Diagnoses:   Principal Problem:    Splenic infarction Active Problems:   Lactic acidosis   Elevated troponin level not due myocardial infarction   Chronic respiratory failure with hypoxia (HCC)   ESRD (end stage renal disease) on dialysis ([facility])   Uncontrolled type 2 diabetes mellitus with hypoglycemia, with long-term current use of insulin (HCC)   Essential hypertension   Mixed diabetic hyperlipidemia associated with type 2 diabetes mellitus (HCC)   Leukocytosis   Dry gangrene (HCC)   Hypotension   PVD (peripheral vascular disease) (HCC)   Hx of AKA (above knee amputation), right (HCC)   Pressure injury of skin   Hepatic abscess   Splenic infarction, 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  Receiving IV cefepime with dialysis + PO flagyl, end date 11/08/2021 -Tunneled dialysis catheter and left TL catheter removed removed 7/11 in setting of persistent leukocytosis to give line holiday -Replace tunneled dialysis catheter 7/14 -Repeat blood culture drawn 7/12 is negative growth to date -Discussed over the phone with Dr. Candiss Norse regarding leukocytosis. She feels it's ok to monitor WBC for now. No new recommendations unless WBC significantly elevated. -Follow-up with Dr. Candiss Norse 7/28   Hepatic and splenic abscesses -Abscess culture showing Staph epidermidis, pansensitive -Continue to monitor drain output from left upper quadrant splenic drain  -Asked IR to put in appropriate follow up recs    Anemia of chronic disease -Received 1 unit packed red blood cells 7/10  ESRD on dialysis TTSa -Nephrology following   Uncontrolled type 2 diabetes mellitus with hypoglycemia -A1c 5.7 -Sliding scale insulin discontinued as patient has been  refusing it   HTN -Norvasc, coreg - has been refusing these medications  daily    HLD -Lipitor   PAD -Status post right AKA -Status post left AKA for gangrene.  Wound VAC removed.  Vascular surgery following.  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 pink (Active)  09/11/21 0000  Location: Buttocks  Location Orientation: Left  Staging: Stage 2 -  Partial thickness loss of dermis presenting as a shallow open injury with a red, pink wound bed without slough.  Wound Description (Comments): pink  Present on Admission: Yes  Dressing Type Foam - Lift dressing to assess site every shift 10/19/21 2024     Pressure Injury 09/11/21 Buttocks Right Stage 2 -  Partial thickness loss of dermis presenting as a shallow open injury with a red, pink wound bed without slough. pink (Active)  09/11/21 0000  Location: Buttocks  Location Orientation: Right  Staging: Stage 2 -  Partial thickness loss of dermis presenting as a shallow open injury with a red, pink wound bed without slough.  Wound Description (Comments): pink  Present on Admission: Yes  Dressing Type Foam - Lift dressing to assess site every shift 10/20/21 2200    Nutrition Problem: Inadequate oral intake Etiology: nausea  Discharge Instructions  Discharge Instructions     Discharge wound care:   Complete by: As directed    Cleanse buttocks wounds with NS and pat dry. Apply gerhardts paste to open wounds.  Leave open to air and in contact with dermatherapy.  Turn and reposition.   No wound care for left middle finger amputation and left  lower leg amputation. Keep areas clean and dry.   Increase activity slowly   Complete by: As directed       Allergies as of 10/21/2021       Reactions   Benzonatate Other (See Comments)   Hallucinations   Hydralazine Other (See Comments)   Hallucinations   Amoxicillin Itching, Nausea And Vomiting   Clonidine Other (See Comments)   Altered mental state, insomnia    Chlorhexidine Itching   Patient reports it is the CHG baths that cause her itching not the CHG used for caring for her IV/HD access.     Morphine Itching   Oxycodone Itching   Pt tolerated hospital admission 07/2021        Medication List     STOP taking these medications    amLODipine 10 MG tablet Commonly known as: NORVASC   aspirin EC 81 MG tablet   carvedilol 6.25 MG tablet Commonly known as: COREG   glipiZIDE 2.5 MG 24 hr tablet Commonly known as: GLUCOTROL XL   HYDROcodone-acetaminophen 5-325 MG tablet Commonly known as: NORCO/VICODIN   insulin glargine 100 UNIT/ML Solostar Pen Commonly known as: LANTUS   lisinopril 20 MG tablet Commonly known as: ZESTRIL   traMADol 50 MG tablet Commonly known as: ULTRAM       TAKE these medications    acetaminophen 500 MG tablet Commonly known as: TYLENOL Take 1,000 mg by mouth as  needed for moderate pain.   atorvastatin 10 MG tablet Commonly known as: LIPITOR Take 1 tablet (10 mg total) by mouth daily. What changed: when to take this   ceFEPIme 2 g in sodium chloride 0.9 % 100 mL Inject 2 g into the vein Every Tuesday,Thursday,and Saturday with dialysis for 17 days. Start taking on: October 22, 2021   diphenhydrAMINE 25 MG tablet Commonly known as: BENADRYL Take 25 mg by mouth 2 (two) times daily as needed for allergies.   docusate sodium 100 MG capsule Commonly known as: COLACE Take 100 mg by mouth 2 (two) times daily.   doxycycline 100 MG capsule Commonly known as: VIBRAMYCIN Take 100 mg by mouth 2 (two) times daily.   gabapentin 100 MG  capsule Commonly known as: NEURONTIN Take 1 capsule (100 mg total) by mouth 2 (two) times daily. What changed:  when to take this additional instructions   loperamide 2 MG tablet Commonly known as: IMODIUM A-D Take 4 mg by mouth as needed for diarrhea or loose stools.   metroNIDAZOLE 500 MG tablet Commonly known as: FLAGYL Take 1 tablet (500 mg total) by mouth every 12 (twelve) hours for 18 days.   nitroGLYCERIN 0.4 MG SL tablet Commonly known as: NITROSTAT Place 1 tablet (0.4 mg total) under the tongue every 5 (five) minutes as needed for chest pain.   oxyCODONE 5 MG immediate release tablet Commonly known as: Oxy IR/ROXICODONE Take 1-2 tablets (5-10 mg total) by mouth every 4 (four) hours as needed for moderate pain (pain score 4-6).   pantoprazole 40 MG tablet Commonly known as: PROTONIX Take 1 tablet (40 mg total) by mouth daily.   simethicone 125 MG chewable tablet Commonly known as: MYLICON Chew 431 mg by mouth in the morning and at bedtime.   tiZANidine 4 MG capsule Commonly known as: ZANAFLEX Take 4 mg by mouth in the morning and at bedtime.   Tums Smoothies 750 MG chewable tablet Generic drug: calcium carbonate Chew 1,500 mg by mouth See admin instructions. Take 1500 mg with each meal and 1500 mg with each snack               Discharge Care Instructions  (From admission, onward)           Start     Ordered   10/21/21 0000  Discharge wound care:       Comments: Cleanse buttocks wounds with NS and pat dry. Apply gerhardts paste to open wounds.  Leave open to air and in contact with dermatherapy.  Turn and reposition.   No wound care for left middle finger amputation and left lower leg amputation. Keep areas clean and dry.   10/21/21 1030            Follow-up Information     [facility]. Go to.   Why: Schedule is Monday,Wednesday, Friday with 11:30 chair time.  Monday, July 17, please arrive at 10:45 to complete paperwork  prior to treatment. Contact information: [place] [address] [phone]         Monico Blitz, MD. Schedule an appointment as soon as possible for a visit in 1 week(s).   Specialty: Internal Medicine Contact information: [place] [address] [phone]         Cherre Robins, MD. Call in 1 month(s).   Specialties: Vascular Surgery, Interventional Cardiology Why: Staple removal Contact information: [address] [place] [address] [phone]  Sherilyn Cooter, MD Follow up.   Specialty: Orthopedic Surgery Why: As needed, If symptoms worsen for left middle finger amputation Contact information: [place] [address] [phone]         Laurice Record, MD. Go on 11/01/2021.   Specialty: Infectious Diseases Contact information: [address]  [place] [address] [phone]                Allergies  Allergen Reactions   Benzonatate Other (See Comments)    Hallucinations   Hydralazine Other (See Comments)    Hallucinations   Amoxicillin Itching and Nausea And Vomiting   Clonidine Other (See Comments)    Altered mental state, insomnia    Chlorhexidine Itching    Patient reports it is the CHG baths that cause her itching not the CHG used for caring for her IV/HD access.     Morphine Itching   Oxycodone Itching    Pt tolerated hospital admission 07/2021   Procedures/Studies: IR Fluoro Guide CV Line Left  Result Date: 10/19/2021 CLINICAL DATA:  End-stage renal disease. Recent removal of right-sided dialysis catheter on 10/15/2021 due to infection. EXAM: TUNNELED CENTRAL VENOUS HEMODIALYSIS CATHETER PLACEMENT WITH ULTRASOUND AND FLUOROSCOPIC GUIDANCE ANESTHESIA/SEDATION: Moderate (conscious) sedation was employed during this procedure. A total of Versed 1.0 mg and Fentanyl 50 mcg was administered intravenously by radiology nursing. Moderate Sedation Time: 27 minutes. The patient's  level of consciousness and vital signs were monitored continuously by radiology nursing throughout the procedure under my direct supervision. MEDICATIONS: 1 g IV vancomycin. FLUOROSCOPY: 2.0 minutes.  70.0 mGy. PROCEDURE: The procedure, risks, benefits, and alternatives were explained to the patient. Questions regarding the procedure were encouraged and answered. The patient understands and consents to the procedure. A timeout was performed prior to initiating the procedure. Ultrasound was performed of both right and left neck. The left neck and chest were prepped with chlorhexidine in a sterile fashion, and a sterile drape was applied covering the operative field. Maximum barrier sterile technique with sterile gowns and gloves were used for the procedure. Local anesthesia was provided with 1% lidocaine. After creating a small venotomy incision, a 21 gauge needle was advanced into the left internal jugular vein under direct, real-time ultrasound guidance. Ultrasound image documentation was performed. After securing guidewire access, an 8 Fr dilator was placed. A J-wire was kinked to measure appropriate catheter length. A Palindrome tunneled hemodialysis catheter measuring 28 cm from tip to cuff was chosen for placement. This was tunneled in a retrograde fashion from the chest wall to the venotomy incision. At the venotomy, serial dilatation was performed and a 15 Fr peel-away sheath was placed over a guidewire. The catheter was then placed through the sheath and the sheath removed. Final catheter positioning was confirmed and documented with a fluoroscopic spot image. The catheter was aspirated, flushed with saline, and injected with appropriate volume heparin dwells. The venotomy incision was closed with subcuticular 4-0 Vicryl. Dermabond was applied to the incision. The catheter exit site was secured with 0-Prolene retention sutures. COMPLICATIONS: None.  No pneumothorax. FINDINGS: By ultrasound the right internal  and external jugular veins are now chronically occluded and there are only tortuous collateral veins present predominantly crossing the midline into the left neck. On the left side, a small internal jugular vein is identified. A true external jugular vein is not identified. Tortuous collateral veins are present. Due to body habitus, neither subclavian vein is able to be visualized by ultrasound. An ultrasound image was  recorded of the left internal jugular vein. The left internal jugular vein was accessed under ultrasound guidance with a 21 gauge needle and micropuncture set and access ultimately secured to allow tunneled dialysis catheter placement. After catheter placement, the new tip lies in the right atrium. The catheter aspirates normally and is ready for immediate use. IMPRESSION: 1. 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 2. The only vein in the neck which would allow dialysis catheter placement is the left internal jugular vein which was accessed for catheter placement today. This is likely the last tenable access in the neck. Neither subclavian vein is visible by ultrasound due to body habitus. 3. A new 28 cm tip to cuff length tunneled hemodialysis catheter was placed via the left internal jugular vein. The catheter tip lies in the right atrium. The catheter is ready for immediate use. Electronically Signed   By: Aletta Edouard M.D.   On: 10/19/2021 08:26   IR US Guide Vasc Access Left  Result Date: 10/19/2021 CLINICAL DATA:  End-stage renal disease. Recent removal of right-sided dialysis catheter on 10/15/2021 due to infection. EXAM: TUNNELED CENTRAL VENOUS HEMODIALYSIS CATHETER PLACEMENT WITH ULTRASOUND AND FLUOROSCOPIC GUIDANCE ANESTHESIA/SEDATION: Moderate (conscious) sedation was employed during this procedure. A total of Versed 1.0 mg and Fentanyl 50 mcg was administered intravenously by radiology nursing. Moderate Sedation Time: 27 minutes. The  patient's level of consciousness and vital signs were monitored continuously by radiology nursing throughout the procedure under my direct supervision. MEDICATIONS: 1 g IV vancomycin. FLUOROSCOPY: 2.0 minutes.  70.0 mGy. PROCEDURE: The procedure, risks, benefits, and alternatives were explained to the patient. Questions regarding the procedure were encouraged and answered. The patient understands and consents to the procedure. A timeout was performed prior to initiating the procedure. Ultrasound was performed of both right and left neck. The left neck and chest were prepped with chlorhexidine in a sterile fashion, and a sterile drape was applied covering the operative field. Maximum barrier sterile technique with sterile gowns and gloves were used for the procedure. Local anesthesia was provided with 1% lidocaine. After creating a small venotomy incision, a 21 gauge needle was advanced into the left internal jugular vein under direct, real-time ultrasound guidance. Ultrasound image documentation was performed. After securing guidewire access, an 8 Fr dilator was placed. A J-wire was kinked to measure appropriate catheter length. A Palindrome tunneled hemodialysis catheter measuring 28 cm from tip to cuff was chosen for placement. This was tunneled in a retrograde fashion from the chest wall to the venotomy incision. At the venotomy, serial dilatation was performed and a 15 Fr peel-away sheath was placed over a guidewire. The catheter was then placed through the sheath and the sheath removed. Final catheter positioning was confirmed and documented with a fluoroscopic spot image. The catheter was aspirated, flushed with saline, and injected with appropriate volume heparin dwells. The venotomy incision was closed with subcuticular 4-0 Vicryl. Dermabond was applied to the incision. The catheter exit site was secured with 0-Prolene retention sutures. COMPLICATIONS: None.  No pneumothorax. FINDINGS: By ultrasound the  right internal and external jugular veins are now chronically occluded and there are only tortuous collateral veins present predominantly crossing the midline into the left neck. On the left side, a small internal jugular vein is identified. A true external jugular vein is not identified. Tortuous collateral veins are present. Due to body habitus, neither subclavian vein is able to be visualized by ultrasound. An ultrasound image was recorded of  the left internal jugular vein. The left internal jugular vein was accessed under ultrasound guidance with a 21 gauge needle and micropuncture set and access ultimately secured to allow tunneled dialysis catheter placement. After catheter placement, the new tip lies in the right atrium. The catheter aspirates normally and is ready for immediate use. IMPRESSION: 1. 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 2. The only vein in the neck which would allow dialysis catheter placement is the left internal jugular vein which was accessed for catheter placement today. This is likely the last tenable access in the neck. Neither subclavian vein is visible by ultrasound due to body habitus. 3. A new 28 cm tip to cuff length tunneled hemodialysis catheter was placed via the left internal jugular vein. The catheter tip lies in the right atrium. The catheter is ready for immediate use. Electronically Signed   By: Aletta Edouard M.D.   On: 10/19/2021 08:26   IR Removal Tun Cv Cath W/O FL  Result Date: 10/15/2021 INDICATION: Request for tunneled dialysis catheter removal due to infection with replacement in 48 hours. EXAM: REMOVAL OF TUNNELED HEMODIALYSIS CATHETER MEDICATIONS: 10 mL 1 % lidocaine COMPLICATIONS: None immediate. PROCEDURE: Informed verbal consent was obtained from the patient following an explanation of the procedure, risks, benefits and alternatives to treatment. A time out was performed prior to the initiation of the  procedure. Sterile technique was utilized including mask, sterile gloves, sterile drape, and hand hygiene. Betadine was used to prep the patient's right neck, chest and existing catheter. 1% lidocaine was injected around the catheter and the subcutaneous tunnel. The catheter was dissected out using curved hemostats until the cuff was freed from the surrounding fibrous sheath. The catheter was removed intact. Hemostasis was obtained with manual compression. A dressing was placed. The patient tolerated the procedure well without immediate post procedural complication. IMPRESSION: Successful removal of tunneled dialysis catheter. Read by: Narda Rutherford, AGNP-BC Electronically Signed   By: Corrie Mckusick D.O.   On: 10/15/2021 18:23   CT IMAGE GUIDED DRAINAGE BY PERCUTANEOUS CATHETER  Result Date: 10/12/2021 INDICATION: History of multifocal infection, post percutaneous hepatic abscess drainage catheter placement on 09/29/2021. Patient presents today for image guided placement of a perisplenic abscess drainage catheter for infection source control purposes. EXAM: ULTRASOUND AND CT-GUIDED PERISPLENIC ABSCESS DRAINAGE CATHETER PLACEMENT COMPARISON:  CT abdomen and pelvis-10/07/2021 MEDICATIONS: The patient is currently admitted to the hospital and receiving intravenous antibiotics. The antibiotics were administered within an appropriate time frame prior to the initiation of the procedure. ANESTHESIA/SEDATION: Moderate (conscious) sedation was employed during this procedure as administered by the Interventional Radiology RN. A total of Versed 3 mg and Fentanyl 150 mcg was administered intravenously. Moderate Sedation Time: 25 minutes. The patient's level of consciousness and vital signs were monitored continuously by radiology nursing throughout the procedure under my direct supervision. CONTRAST:  None COMPLICATIONS: None immediate. PROCEDURE: RADIATION DOSE REDUCTION: This exam was performed according to the departmental  dose-optimization program which includes automated exposure control, adjustment of the mA and/or kV according to patient size and/or use of iterative reconstruction technique. Informed written consent was obtained from the patient after a discussion of the risks, benefits and alternatives to treatment. The patient was placed supine on the CT gantry and a pre procedural CT was performed re-demonstrating the known abscess/fluid collection within the perisplenic space with dominant component measuring approximally 12.4 x 7.1 cm (image 42, series 2). The CT gantry table position was marked and  the collection was identified sonographically. The procedure was planned. A timeout was performed prior to the initiation of the procedure. The skin overlying the left upper abdominal quadrant was prepped and draped in the usual sterile fashion. After the overlying soft tissues were anesthetized 1% lidocaine with epinephrine, the perisplenic fluid collection was targeted with an 18 gauge trocar needle yielding a return of purulent fluid. A short Amplatz wire was coiled within the collection. Appropriate positioning was confirmed with a limited CT scan. The tract was serially dilated allowing placement of a 12 [place] all-purpose drainage catheter. Appropriate positioning was confirmed with a limited postprocedural CT scan. Approximally 350 ml of purulent fluid was aspirated. The tube was connected to a drainage bag and sutured in place. A dressing was placed. The patient tolerated the procedure well without immediate post procedural complication. IMPRESSION: Successful CT guided placement of a 46 French all purpose drain catheter into the perisplenic abscess with aspiration of 350 mL of purulent fluid. Samples were sent to the laboratory as requested by the ordering clinical team. Electronically Signed   By: Sandi Mariscal M.D.   On: 10/12/2021 11:59   CT ABDOMEN PELVIS W CONTRAST  Addendum Date: 10/11/2021   ADDENDUM REPORT:  10/11/2021 15:02 ADDENDUM: These results were called by Dr. Rosario Jacks by telephone on 10/09/2021 at 12:33 pm to provider Dr. Graylon Good (infectious disease), who verbally acknowledged these results. Electronically Signed   By: Ronney Asters M.D.   On: 10/11/2021 15:02   Addendum Date: 09/27/2021   ADDENDUM REPORT: 09/27/2021 23:49 ADDENDUM: These results were called by telephone at the time of interpretation on 09/27/2021 at 11:49 pm to provider Dr. Lucile Shutters, who verbally acknowledged these results. Electronically Signed   By: Ronney Asters M.D.   On: 09/27/2021 23:49   Result Date: 10/11/2021 CLINICAL DATA:  Sepsis, leukocytosis. EXAM: CT ABDOMEN AND PELVIS WITH CONTRAST TECHNIQUE: Multidetector CT imaging of the abdomen and pelvis was performed using the standard protocol following bolus administration of intravenous contrast. RADIATION DOSE REDUCTION: This exam was performed according to the departmental dose-optimization program which includes automated exposure control, adjustment of the mA and/or kV according to patient size and/or use of iterative reconstruction technique. CONTRAST:  <See Chart> OMNIPAQUE IOHEXOL 300 MG/ML  SOLN COMPARISON:  CT abdomen and CT abdomen and pelvis 09/10/2021. FINDINGS: Lower chest: Minimal left basilar atelectasis. New small left pleural effusion. Cardiomegaly. Hepatobiliary: There is a new hypodense lesion in the left lobe of the liver peripherally measuring 5.1 x 5.7 x 6.1 cm. Portion of this abuts the lesser curvature of the stomach. Gallbladder and bile ducts are within normal limits. Pancreas: Unremarkable. No pancreatic ductal dilatation or surrounding inflammatory changes. Spleen: Again seen are low attenuation areas throughout the spleen. These have increased in size. The largest area is now more oval in shape measuring 5.9 x 10.0 by 8.9 cm. This is subcapsular in location. Adrenals/Urinary Tract: Bilateral adrenal glands appear within normal limits scratch fat. Bilateral renal  atrophy is again seen. Bilateral perinephric stranding and fluid has increased and is now tracking along the retroperitoneum. No hydronephrosis. Bladder is decompressed. Stomach/Bowel: Stomach is within normal limits. No evidence of bowel wall thickening, distention, or inflammatory changes. Appendix is not seen. Vascular/Lymphatic: Aortic atherosclerosis. No enlarged abdominal or pelvic lymph nodes. Reproductive: Uterus and bilateral adnexa are unremarkable. Other: No abdominal wall hernia or abnormality. No abdominopelvic ascites. Musculoskeletal: No acute or significant osseous findings. IMPRESSION: 1. There is a new 5.1 x 5.7 x 6.1 cm hypodense area  in the subcapsular left lobe of the liver. Findings are concerning for abscess given the clinical history. 2. 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 Findings may represent subcapsular hematoma given recent infarcts, but splenic abscess is also in the differential given findings in the liver. 3. Bilateral renal atrophy. Increasing perinephric stranding and fluid may represent infection. 4. New small left pleural effusion. 5. Aortic Atherosclerosis (ICD10-I70.0). Electronically Signed: By: Ronney Asters M.D. On: 09/27/2021 23:27   CT ABDOMEN PELVIS W CONTRAST  Result Date: 10/07/2021 CLINICAL DATA:  Abdominal pain, post-op Admitted with hepatic and splenic abscess, abdominal drain, splenic infarct, bacteremia, persistent leukocytosis, assess for interval improvement. Also has capsular hematoma noted on previous CT, assess for interval resolution EXAM: CT ABDOMEN AND PELVIS WITH CONTRAST TECHNIQUE: Multidetector CT imaging of the abdomen and pelvis was performed using the standard protocol following bolus administration of intravenous contrast. RADIATION DOSE REDUCTION: This exam was performed according to the departmental dose-optimization program which includes automated  exposure control, adjustment of the mA and/or kV according to patient size and/or use of iterative reconstruction technique. CONTRAST:  138mL OMNIPAQUE IOHEXOL 300 MG/ML  SOLN COMPARISON:  09/27/2021 FINDINGS: Lower chest: Small-moderate-sized loculated left pleural effusion with associated compressive atelectasis. Cardiomegaly with coronary artery atherosclerosis. No pericardial effusion. Hepatobiliary: Percutaneous pigtail drainage catheter within the left hepatic lobe with near complete resolution of previously seen hepatic fluid collection/abscess. Minimal fluid attenuation surrounds the pigtail. No new intrahepatic collection. Unremarkable gallbladder. Pancreas: Unremarkable. No pancreatic ductal dilatation or surrounding inflammatory changes. Spleen: Large bilobed subcapsular fluid collection within the spleen measures approximately 10.1 x 6.6 x 10.7 cm (previously measured approximately 10.0 x 5.9 x 8.9 cm). Adrenals/Urinary Tract: Adrenal glands within normal limits. Severe bilateral renal atrophy. No renal stone or hydronephrosis. Perinephric stranding and fluid associated with both kidneys is increased from prior. No organized perinephric fluid collection. Urinary bladder is decompressed. Stomach/Bowel: Stomach is within normal limits. Appendix appears normal. No evidence of bowel wall thickening, distention, or inflammatory changes. Vascular/Lymphatic: Aortic atherosclerosis. No enlarged abdominal or pelvic lymph nodes. Reproductive: Uterus and bilateral adnexa are unremarkable. Other: No pneumoperitoneum. Small volume perihepatic ascites, slightly increased. Musculoskeletal: No new or acute bony findings. IMPRESSION: 1. Percutaneous pigtail drainage catheter within the left hepatic lobe with near complete resolution of previously seen hepatic fluid collection/abscess. 2. Large bilobed subcapsular fluid collection within the spleen measures up to 10.7 cm (previously measured up to 10.0 cm). 3. Small  volume perihepatic ascites, slightly increased from prior. 4. Small-moderate-sized loculated left pleural effusion with associated compressive atelectasis. 5. Severe bilateral renal atrophy. Perinephric stranding and fluid associated with both kidneys is increased from prior. No organized perinephric fluid collection. Aortic Atherosclerosis (ICD10-I70.0). Electronically Signed   By: Davina Poke D.O.   On: 10/07/2021 12:11   DG CHEST PORT 1 VIEW  Result Date: 10/04/2021 CLINICAL DATA:  Pulmonary edema.  ESRD. EXAM: PORTABLE CHEST 1 VIEW COMPARISON:  September 15, 2021. FINDINGS: Moderate left pleural effusion. Overlying left basilar opacities. No overt pulmonary edema. No visible pneumothorax. Enlarged cardiac silhouette. Pulmonary vascular congestion. Similar position of right and left IJ central venous catheters. IMPRESSION: 1. Moderate left pleural effusion with overlying left basilar atelectasis and/or consolidation. 2. Cardiomegaly and pulmonary vascular congestion. No overt pulmonary edema. Electronically Signed   By: Margaretha Sheffield M.D.   On: 10/04/2021 09:20   CT IMAGE GUIDED DRAINAGE BY PERCUTANEOUS CATHETER  Result Date: 09/29/2021  CLINICAL DATA:  Hepatic abscess. EXAM: CT GUIDED CATHETER DRAINAGE OF HEPATIC ABSCESS ANESTHESIA/SEDATION: Moderate (conscious) sedation was employed during this procedure. A total of Versed 1.5 mg and Fentanyl 100 mcg was administered intravenously by radiology nurse. Moderate Sedation Time: 10 minutes. The patient's level of consciousness and vital signs were monitored continuously by radiology nursing throughout the procedure under my direct supervision. PROCEDURE: The procedure, risks, benefits, and alternatives were explained to the patient. Questions regarding the procedure were encouraged and answered. The patient understands and consents to the procedure. A time out was performed prior to initiating the procedure. CT was performed through the abdomen in a  supine position. The anterior abdominal wall was prepped with chlorhexidine in a sterile fashion, and a sterile drape was applied covering the operative field. A sterile gown and sterile gloves were used for the procedure. Local anesthesia was provided with 1% Lidocaine. Under CT guidance, an 18 gauge trocar needle was advanced into an abscess in the left lobe of the liver. After confirming needle tip position and aspiration of a small amount of fluid, a guidewire was advanced into the collection, the tract was dilated over the wire and a 10 French percutaneous pigtail drainage catheter advanced and formed. Drain position was confirmed by CT. A larger fluid sample was then withdrawn from the drain and sent for culture analysis. The drain was flushed with sterile saline and connected to a suction bulb. The drainage catheter was secured at the skin with a Prolene retention suture and overlying sterile dressing. RADIATION DOSE REDUCTION: This exam was performed according to the departmental dose-optimization program which includes automated exposure control, adjustment of the mA and/or kV according to patient size and/or use of iterative reconstruction technique. COMPLICATIONS: None FINDINGS: CT again demonstrates an ill-defined ovoid collection within the left lobe of the liver measuring approximately 6 cm in maximum diameter. After needle puncture, there was return of grossly purulent fluid. After placement of the drain, there is abundant return of purulent fluid. The communicating splenic collections appear similar by CT and may also represent abscesses. IMPRESSION: 1. CT-guided percutaneous catheter drainage of left lobe hepatic abscess yielding purulent fluid. A fluid sample was sent for culture analysis. A 10 French drainage catheter was placed and attached to suction bulb drainage. 2. Rounded communicating splenic collections are similar in appearance to the hepatic collection and are suspicious for splenic  abscesses. Electronically Signed   By: Aletta Edouard M.D.   On: 09/29/2021 13:17   CT HEAD WO CONTRAST (5MM)  Result Date: 09/28/2021 CLINICAL DATA:  Initial evaluation for TIA, weakness. EXAM: CT HEAD WITHOUT CONTRAST TECHNIQUE: Contiguous axial images were obtained from the base of the skull through the vertex without intravenous contrast. RADIATION DOSE REDUCTION: This exam was performed according to the departmental dose-optimization program which includes automated exposure control, adjustment of the mA and/or kV according to patient size and/or use of iterative reconstruction technique. COMPARISON:  None Available. FINDINGS: Brain: Cerebral volume within normal limits. No acute intracranial hemorrhage. No visible acute large vessel territory infarct. No mass lesion, midline shift or mass effect. No hydrocephalus or extra-axial fluid collection. Vascular: No hyperdense vessel. Focal calcification noted at the level of the right sylvian fissure, possibly an embolic calcification. Skull: Scalp soft tissues and calvarium within normal limits. Sinuses/Orbits: Globes orbital soft tissues within normal limits. Moderate left maxillary sinusitis noted. Moderate bilateral mastoid effusions noted as well. Other: None. IMPRESSION: 1. No acute intracranial abnormality. 2. Moderate left maxillary sinusitis. 3. Moderate bilateral  mastoid effusions. Electronically Signed   By: Jeannine Boga M.D.   On: 09/28/2021 00:54   ECHO TEE  Result Date: 09/27/2021    TRANSESOPHOGEAL ECHO REPORT   Patient Name:   Courtney Gray Date of Exam: 09/27/2021 Medical Rec #:  [id number]       Height:       65.0 in Accession #:    [id number]      Weight:       339.9 lb Date of Birth:  13-May-1970        BSA:          2.478 m Patient Age:    80 years        BP:           105/64 mmHg Patient Gender: F               HR:           107 bpm. Exam Location:  Inpatient Procedure: Transesophageal Echo, Color Doppler, Cardiac Doppler and 3D  Echo Indications:     Bacteremia. Splenic infarct.  History:         Patient has prior history of Echocardiogram examinations, most                  recent 09/10/2021. ESRD; Risk Factors:Diabetes and Hypertension.  Sonographer:     Johny Chess RDCS Sonographer#2:   Eartha Inch Referring Phys:  Nancy Fetter DUNN Diagnosing Phys: Kirk Ruths MD PROCEDURE: After discussion of the risks and benefits of a TEE, an informed consent was obtained from the patient. The patient was intubated. The transesophogeal probe was passed without difficulty through the esophogus of the patient. Sedation performed  by different physician. The patient was monitored while under deep sedation. The patient developed no complications during the procedure. IMPRESSIONS  1. Dialysis catheter noted in SVC; calcified right atrial mass noted (etiology unclear; possible calcified thrombus); if pt can tolerate, cardiac MRI could provide more definitive evaluation.  2. Left ventricular ejection fraction, by estimation, is 65 to 70%. The left ventricle has normal function. The left ventricle has no regional wall motion abnormalities.  3. Right ventricular systolic function is normal. The right ventricular size is normal.  4. Left atrial size was mildly dilated. No left atrial/left atrial appendage thrombus was detected.  5. Right atrial size was mildly dilated.  6. The mitral valve is normal in structure. Mild mitral valve regurgitation. Moderate mitral annular calcification.  7. Tricuspid valve regurgitation is moderate.  8. The aortic valve is tricuspid. Aortic valve regurgitation is not visualized. Aortic valve sclerosis is present, with no evidence of aortic valve stenosis.  9. There is mild (Grade II) plaque involving the descending aorta. 10. Evidence of atrial level shunting detected by color flow Doppler. Agitated saline contrast bubble study was positive with shunting observed within 3-6 cardiac cycles suggestive of interatrial  shunt. FINDINGS  Left Ventricle: Left ventricular ejection fraction, by estimation, is 65 to 70%. The left ventricle has normal function. The left ventricle has no regional wall motion abnormalities. The left ventricular internal cavity size was normal in size. Right Ventricle: The right ventricular size is normal. Right ventricular systolic function is normal. Left Atrium: Left atrial size was mildly dilated. No left atrial/left atrial appendage thrombus was detected. Right Atrium: Right atrial size was mildly dilated. Pericardium: There is no evidence of pericardial effusion. Mitral Valve: The mitral valve is normal in structure. Moderate mitral annular calcification. Mild mitral valve regurgitation. Tricuspid  Valve: The tricuspid valve is normal in structure. Tricuspid valve regurgitation is moderate. Aortic Valve: The aortic valve is tricuspid. Aortic valve regurgitation is not visualized. Aortic valve sclerosis is present, with no evidence of aortic valve stenosis. Pulmonic Valve: The pulmonic valve was normal in structure. Pulmonic valve regurgitation is trivial. Aorta: The aortic root is normal in size and structure. There is mild (Grade II) plaque involving the descending aorta. IAS/Shunts: The interatrial septum is aneurysmal. Evidence of atrial level shunting detected by color flow Doppler. Agitated saline contrast bubble study was positive with shunting observed within 3-6 cardiac cycles suggestive of interatrial shunt. Additional Comments: Dialysis catheter noted in SVC; calcified right atrial mass noted (etiology unclear; possible calcified thrombus); if pt can tolerate, cardiac MRI could provide more definitive evaluation.  TRICUSPID VALVE TR Peak grad:   54.5 mmHg TR Vmax:        369.00 cm/s Kirk Ruths MD Electronically signed by Kirk Ruths MD Signature Date/Time: 09/27/2021/2:25:38 PM    Final       Discharge Exam: Vitals:   10/21/21 1002 10/21/21 1031  BP: (!) 144/71 (!) 148/72   Pulse: 83 84  Resp: 18 15  Temp:    SpO2: 99% 100%    General: Pt is alert, awake, not in acute distress Cardiovascular: RRR, S1/S2 +, no edema Respiratory: CTA bilaterally, no wheezing, no rhonchi, no respiratory distress, no conversational dyspnea  Abdominal: Soft, NT, ND, bowel sounds + Extremities: Bilateral AKA Psych: Normal mood and affect, stable judgement and insight     The results of significant diagnostics from this hospitalization (including imaging, microbiology, ancillary and laboratory) are listed below for reference.     Microbiology: Recent Results (from the past 240 hour(s))  Aerobic/Anaerobic Culture w Gram Stain (surgical/deep wound)     Status: None   Collection Time: 10/11/21  5:00 PM   Specimen: Abscess  Result Value Ref Range Status   Specimen Description ABSCESS  Final   Special Requests SPLEEN  Final   Gram Stain   Final    ABUNDANT WBC PRESENT,BOTH PMN AND MONONUCLEAR NO ORGANISMS SEEN    Culture   Final    RARE STAPHYLOCOCCUS EPIDERMIDIS NO ANAEROBES ISOLATED Performed at [facility], [address], [place], [place] [address]    Report Status 10/16/2021 FINAL  Final   Organism ID, Bacteria STAPHYLOCOCCUS EPIDERMIDIS  Final      Susceptibility   Staphylococcus epidermidis - MIC*    CIPROFLOXACIN <=0.5 SENSITIVE Sensitive     ERYTHROMYCIN <=0.25 SENSITIVE Sensitive     GENTAMICIN <=0.5 SENSITIVE Sensitive     OXACILLIN <=0.25 SENSITIVE Sensitive     TETRACYCLINE <=1 SENSITIVE Sensitive     VANCOMYCIN 1 SENSITIVE Sensitive     TRIMETH/SULFA <=10 SENSITIVE Sensitive     CLINDAMYCIN <=0.25 SENSITIVE Sensitive     RIFAMPIN <=0.5 SENSITIVE Sensitive     Inducible Clindamycin NEGATIVE Sensitive     * RARE STAPHYLOCOCCUS EPIDERMIDIS  Culture, blood (Routine X 2) w Reflex to ID Panel     Status: None   Collection Time: 10/16/21  3:13 PM   Specimen: BLOOD RIGHT HAND  Result Value Ref Range Status   Specimen Description BLOOD RIGHT HAND   Final   Special Requests   Final    BOTTLES DRAWN AEROBIC AND ANAEROBIC Blood Culture results may not be optimal due to an inadequate volume of blood received in culture bottles   Culture   Final    NO GROWTH 5 DAYS  Performed at [facility], [place] [address], [place], [place] [address]    Report Status 10/21/2021 FINAL  Final  Culture, blood (Routine X 2) w Reflex to ID Panel     Status: None   Collection Time: 10/16/21  3:34 PM   Specimen: BLOOD  Result Value Ref Range Status   Specimen Description BLOOD LEFT ANTECUBITAL  Final   Special Requests   Final    BOTTLES DRAWN AEROBIC AND ANAEROBIC Blood Culture results may not be optimal due to an inadequate volume of blood received in culture bottles   Culture   Final    NO GROWTH 5 DAYS Performed at [facility], [place] [address], [place], [place] [address]    Report Status 10/21/2021 FINAL  Final     Labs: BNP (last 3 results) Recent Labs    07/09/21 1105  BNP 2,505.3*   Basic Metabolic Panel: Recent Labs  Lab 10/15/21 0421 10/17/21 0222 10/18/21 0413 10/19/21 0800 10/20/21 0129 10/21/21 1002  NA 130* 127* 130* 127* 131* 131*  K 3.8 4.7 3.5 3.6 4.2 3.8  CL 94* 93* 94* 93* 96* 95*  CO2 24 26 23  21* 22 22  GLUCOSE 91 119* 82 94 90 79  BUN 32* 29* 30* 37* 29* 34*  CREATININE 6.31* 3.75* 7.01* 8.23* 6.84* 8.03*  CALCIUM 7.6* 8.2* 7.8* 7.6* 7.8* 7.8*  PHOS 5.7* 3.8  --   --   --   --    Liver Function Tests: Recent Labs  Lab 10/15/21 0421 10/17/21 0222  ALBUMIN 2.0* 2.2*   No results for input(s): "LIPASE", "AMYLASE" in the last 168 hours. No results for input(s): "AMMONIA" in the last 168 hours. CBC: Recent Labs  Lab 10/17/21 0222 10/18/21 0413 10/19/21 0800 10/20/21 0129 10/21/21 1002  WBC 9.9 12.0* 13.5* 13.7* 12.4*  NEUTROABS 5.9  --   --   --   --   HGB 7.5* 9.3* 9.4* 9.4* 9.6*  HCT 23.4* 29.5* 29.6* 29.4* 30.1*  MCV 94.0 93.1 91.6 89.9 93.2  PLT 210 187 209 175 204   Cardiac  Enzymes: No results for input(s): "CKTOTAL", "CKMB", "CKMBINDEX", "TROPONINI" in the last 168 hours. BNP: Invalid input(s): "POCBNP" CBG: Recent Labs  Lab 10/19/21 1229 10/19/21 2116 10/20/21 1632 10/20/21 2105 10/21/21 0726  GLUCAP 101* 138* 133* 105* 81   D-Dimer No results for input(s): "DDIMER" in the last 72 hours. Hgb A1c No results for input(s): "HGBA1C" in the last 72 hours. Lipid Profile No results for input(s): "CHOL", "HDL", "LDLCALC", "TRIG", "CHOLHDL", "LDLDIRECT" in the last 72 hours. Thyroid function studies No results for input(s): "TSH", "T4TOTAL", "T3FREE", "THYROIDAB" in the last 72 hours.  Invalid input(s): "FREET3" Anemia work up No results for input(s): "VITAMINB12", "FOLATE", "FERRITIN", "TIBC", "IRON", "RETICCTPCT" in the last 72 hours. Urinalysis No results found for: "COLORURINE", "APPEARANCEUR", "LABSPEC", "PHURINE", "GLUCOSEU", "HGBUR", "BILIRUBINUR", "KETONESUR", "PROTEINUR", "UROBILINOGEN", "NITRITE", "LEUKOCYTESUR" Sepsis Labs Recent Labs  Lab 10/18/21 0413 10/19/21 0800 10/20/21 0129 10/21/21 1002  WBC 12.0* 13.5* 13.7* 12.4*   Microbiology Recent Results (from the past 240 hour(s))  Aerobic/Anaerobic Culture w Gram Stain (surgical/deep wound)     Status: None   Collection Time: 10/11/21  5:00 PM   Specimen: Abscess  Result Value Ref Range Status   Specimen Description ABSCESS  Final   Special Requests SPLEEN  Final   Gram Stain   Final    ABUNDANT WBC PRESENT,BOTH PMN AND MONONUCLEAR NO ORGANISMS SEEN    Culture  Final    RARE STAPHYLOCOCCUS EPIDERMIDIS NO ANAEROBES ISOLATED Performed at [facility], [place] [address] [place], [place] [address]    Report Status 10/16/2021 FINAL  Final   Organism ID, Bacteria STAPHYLOCOCCUS EPIDERMIDIS  Final      Susceptibility   Staphylococcus epidermidis - MIC*    CIPROFLOXACIN <=0.5 SENSITIVE Sensitive     ERYTHROMYCIN <=0.25 SENSITIVE Sensitive     GENTAMICIN <=0.5 SENSITIVE  Sensitive     OXACILLIN <=0.25 SENSITIVE Sensitive     TETRACYCLINE <=1 SENSITIVE Sensitive     VANCOMYCIN 1 SENSITIVE Sensitive     TRIMETH/SULFA <=10 SENSITIVE Sensitive     CLINDAMYCIN <=0.25 SENSITIVE Sensitive     RIFAMPIN <=0.5 SENSITIVE Sensitive     Inducible Clindamycin NEGATIVE Sensitive     * RARE STAPHYLOCOCCUS EPIDERMIDIS  Culture, blood (Routine X 2) w Reflex to ID Panel     Status: None   Collection Time: 10/16/21  3:13 PM   Specimen: BLOOD RIGHT HAND  Result Value Ref Range Status   Specimen Description BLOOD RIGHT HAND  Final   Special Requests   Final    BOTTLES DRAWN AEROBIC AND ANAEROBIC Blood Culture results may not be optimal due to an inadequate volume of blood received in culture bottles   Culture   Final    NO GROWTH 5 DAYS Performed at [facility], [place] [address], [place], [place] [address]    Report Status 10/21/2021 FINAL  Final  Culture, blood (Routine X 2) w Reflex to ID Panel     Status: None   Collection Time: 10/16/21  3:34 PM   Specimen: BLOOD  Result Value Ref Range Status   Specimen Description BLOOD LEFT ANTECUBITAL  Final   Special Requests   Final    BOTTLES DRAWN AEROBIC AND ANAEROBIC Blood Culture results may not be optimal due to an inadequate volume of blood received in culture bottles   Culture   Final    NO GROWTH 5 DAYS Performed at [facility], [place] [address], [place], [place] [address]    Report Status 10/21/2021 FINAL  Final     Patient was seen and examined on the day of discharge and was found to be in stable condition. Time coordinating discharge: 40 minutes including assessment and coordination of care, as well as examination of the patient.   SIGNED:  Dessa Phi, DO [facility] 10/21/2021, 10:37 AM

## 2021-10-21 NOTE — TOC Transition Note (Signed)
Transition of Care [facility]) - CM/SW Discharge Note   Patient Details  Name: Courtney Gray MRN: [id number] Date of Birth: 1971-03-02  Transition of Care [facility] CM/SW Contact:  Tresa Endo Phone Number: 10/21/2021, 11:34 AM   Clinical Narrative:    Patient will DC to: [facility] Anticipated DC date: 10/21/2021 Family notified: Left message with Pt Daughter Transport by: Corey Harold   Per MD patient ready for DC to [facility]. RN to call report prior to discharge [phone]). RN, patient, patient's family, and facility notified of DC. Discharge Summary and FL2 sent to facility. DC packet on chart. Ambulance transport requested for patient.   CSW will sign off for now as social work intervention is no longer needed. Please consult [place] again if new needs arise.       Barriers to Discharge: Continued Medical Work up   Patient Goals and CMS Choice Patient states their goals for this hospitalization and ongoing recovery are:: SNF CMS Medicare.gov Compare Post Acute Care list provided to:: Patient Choice offered to / list presented to : Patient, Adult Children (Daughter, Antionette)  Discharge Placement                       Discharge Plan and Services In-house Referral: Clinical Social Work Discharge Planning Services: CM Consult Post Acute Care Choice: Durable Medical Equipment                               Social Determinants of Health (SDOH) Interventions     Readmission Risk Interventions    09/16/2021    4:36 PM 08/28/2021   11:50 AM 07/12/2021   12:38 PM  Readmission Risk Prevention Plan  Transportation Screening Complete Complete Complete  Medication Review [employer])  Complete Complete  PCP or Specialist appointment within 3-5 days of discharge  Complete Complete  HRI or Home Care Consult Complete Complete Complete  SW Recovery Care/Counseling Consult Complete Complete Complete  Palliative Care Screening  Not Applicable Not  Applicable  Skilled Nursing Facility Complete Not Applicable Complete

## 2021-10-21 NOTE — Progress Notes (Signed)
OT Cancellation Note  Patient Details Name: Courtney Gray MRN: [id number] DOB: July 11, 1970   Cancelled Treatment:    Reason Eval/Treat Not Completed: Fatigue/lethargy limiting ability to participate  [name] 10/21/2021, 11:34 AM Cleta Alberts, OTR/L Acute Rehabilitation Services Office: [phone]

## 2021-10-21 NOTE — Progress Notes (Signed)
Nephrology Follow-Up Consult note   Assessment/Recommendations: Courtney Gray is a/an 50 y.o. female with a past medical history significant for ESRD, CHF, DM 2, obesity, PVD status post AKA bilaterally, admitted for sepsis.       Dialysis Orders: [name] TTS 4h 64min, BFR 400, EDW 151 kg, 1K/2.5Ca bath, TDC Hep 2,600 load and 1,500 hourly-Total dose 9275 units per treatment.  - mircera 200ug q2 due 09/10/21 - calcitriol 1 ug tiw - sensipar 30 mg tiw   Septic shock/splenic abscess: 6/17= BC 1 set  + Bacteroides Thetaiotaomicron/required short-term pressors / TEE. Neg./Found to have liver abscess and concern for splenic abscess. ID -s/p TDC (IR) and L TL (IV team) PermCath catheter removal 7/11  line holiday.  HD catheter replaced 7/14 left IJ by IR.   Remains on course of Cefepime with HD and oral metronidazole.  PAD s/p  R AKA, s/p amp.L mid. finger 09/23/2021. 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 ESRD: Had line holiday earlier as above.  Plan for dialysis today maintain MWF schedule.  Can restart heparin if needed this week Chronic hypoxic resp failure - At baseline, she is on 3L O2 at home. HTN/volume:  Optimize UF with HD.  Continue with ultrafiltration as tolerated Anemia of ESRD:  Am HGB 9.4 ,S/p 6 units PRBCs , Continue Aranesp 200 mcg.  Q. Sat HD Sec.Hyperparathyroidism: Phosphorus okay holding binder/ Continue VDRA + sensipar.  T2DM- per primary team Malnutrition in setting of Morbid Obesity - push protein   Recommendations conveyed to primary service.    [facility] 10/21/2021 9:02 AM  ___________________________________________________________  CC: Sepsis  Interval History/Subjective: Patient has no complaints other than being confused about dialysis plan.  Discussed that her dialysis treatment schedule was changed because she is going to a different dialysis unit   Medications:  Current Facility-Administered Medications  Medication Dose Route  Frequency Provider Last Rate Last Admin   0.9 %  sodium chloride infusion  250 mL Intravenous Continuous Schuh, McKenzi P, PA-C 10 mL/hr at 10/09/21 1410 Restarted at 10/09/21 1411   acetaminophen (TYLENOL) tablet 650 mg  650 mg Oral Q6H PRN Vicente Serene P, PA-C   650 mg at 10/19/21 2323   Or   acetaminophen (TYLENOL) suppository 650 mg  650 mg Rectal Q6H PRN Schuh, McKenzi P, PA-C       albuterol (PROVENTIL) (2.5 MG/3ML) 0.083% nebulizer solution 2.5 mg  2.5 mg Nebulization Q3H PRN Schuh, McKenzi P, PA-C   2.5 mg at 09/15/21 1000   alteplase (CATHFLO ACTIVASE) injection 2 mg  2 mg Intracatheter Once PRN Adelfa Koh, NP       atorvastatin (LIPITOR) tablet 10 mg  10 mg Oral QHS Schuh, McKenzi P, PA-C   10 mg at 10/20/21 2358   calcitRIOL (ROCALTROL) capsule 1 mcg  1 mcg Oral Q T,Th,Sa-HD Schuh, McKenzi P, PA-C   1 mcg at 10/19/21 0903   ceFEPIme (MAXIPIME) 2 g in sodium chloride 0.9 % 100 mL IVPB  2 g Intravenous Q T,Th,Sa-HD Laurice Record, MD 200 mL/hr at 10/19/21 1707 2 g at 10/19/21 1707   Chlorhexidine Gluconate Cloth 2 % PADS 6 each  6 each Topical Q0600 Ernest Haber, PA-C       cinacalcet (SENSIPAR) tablet 30 mg  30 mg Oral Q T,Th,Sa-HD Schuh, McKenzi P, PA-C   30 mg at 10/19/21 1250   Darbepoetin Alfa (ARANESP) injection 200 mcg  200 mcg Intravenous Q Sat-HD Schuh, McKenzi P, PA-C   200  mcg at 10/19/21 0906   docusate sodium (COLACE) capsule 100 mg  100 mg Oral Daily Schuh, McKenzi P, PA-C   100 mg at 10/20/21 1046   feeding supplement (BOOST / RESOURCE BREEZE) liquid 1 Container  1 Container Oral TID BM Schuh, McKenzi P, PA-C   1 Container at 10/20/21 1048   gabapentin (NEURONTIN) capsule 100 mg  100 mg Oral Q12H Schuh, McKenzi P, PA-C   100 mg at 10/20/21 2358   gabapentin (NEURONTIN) capsule 200 mg  200 mg Oral Q T,Th,Sa-HD Schuh, McKenzi P, PA-C   200 mg at 10/19/21 1249   Gerhardt's butt cream   Topical BID Gerri Lins, PA-C   Given at 10/21/21 0007   glucagon (human  recombinant) (GLUCAGEN) injection 1 mg  1 mg Intramuscular Once PRN Schuh, McKenzi P, PA-C       glucose chewable tablet 4 g  1 tablet Oral PRN Schuh, McKenzi P, PA-C       guaiFENesin-dextromethorphan (ROBITUSSIN DM) 100-10 MG/5ML syrup 15 mL  15 mL Oral Q4H PRN Schuh, McKenzi P, PA-C       heparin injection 1,000 Units  1,000 Units Dialysis PRN Adelfa Koh, NP       heparin injection 5,000 Units  5,000 Units Subcutaneous Q8H Debbe Odea, MD   5,000 Units at 10/21/21 0638   lactulose (CHRONULAC) 10 GM/15ML solution 10 g  10 g Oral Daily PRN Dessa Phi, DO   10 g at 10/16/21 1657   lidocaine (PF) (XYLOCAINE) 1 % injection 5 mL  5 mL Intradermal PRN Schuh, McKenzi P, PA-C       lidocaine-prilocaine (EMLA) cream 1 application.  1 application  Topical PRN Schuh, McKenzi P, PA-C       magnesium sulfate IVPB 2 g 50 mL  2 g Intravenous Daily PRN Schuh, McKenzi P, PA-C       metroNIDAZOLE (FLAGYL) tablet 500 mg  500 mg Oral Q12H Laurice Record, MD   500 mg at 10/20/21 2359   multivitamin (RENA-VIT) tablet 1 tablet  1 tablet Oral QHS Schuh, McKenzi P, PA-C   1 tablet at 10/20/21 2358   naloxone (NARCAN) injection 0.4 mg  0.4 mg Intravenous PRN Florencia Reasons, MD       nitroGLYCERIN (NITROSTAT) SL tablet 0.4 mg  0.4 mg Sublingual Q5 min PRN Schuh, McKenzi P, PA-C       ondansetron (ZOFRAN) tablet 4 mg  4 mg Oral Q6H PRN Schuh, McKenzi P, PA-C       Or   ondansetron (ZOFRAN) injection 4 mg  4 mg Intravenous Q6H PRN Schuh, McKenzi P, PA-C   4 mg at 10/11/21 0208   oxyCODONE (Oxy IR/ROXICODONE) immediate release tablet 10-15 mg  10-15 mg Oral Q4H PRN Schuh, McKenzi P, PA-C   15 mg at 10/17/21 1208   oxyCODONE (Oxy IR/ROXICODONE) immediate release tablet 5-10 mg  5-10 mg Oral Q4H PRN Schuh, McKenzi P, PA-C       pantoprazole (PROTONIX) EC tablet 40 mg  40 mg Oral BID Schuh, McKenzi P, PA-C   40 mg at 10/20/21 2358   pentafluoroprop-tetrafluoroeth (GEBAUERS) aerosol 1 application.  1 application   Topical PRN Schuh, McKenzi P, PA-C       phenol (CHLORASEPTIC) mouth spray 1 spray  1 spray Mouth/Throat PRN Schuh, McKenzi P, PA-C       polyethylene glycol (MIRALAX / GLYCOLAX) packet 17 g  17 g Oral Daily PRN Schuh, McKenzi P, PA-C  polyethylene glycol (MIRALAX / GLYCOLAX) packet 17 g  17 g Oral BID Florencia Reasons, MD   17 g at 10/19/21 2323   prochlorperazine (COMPAZINE) injection 5 mg  5 mg Intravenous Q6H PRN Schuh, McKenzi P, PA-C   5 mg at 10/09/21 1748   senna-docusate (Senokot-S) tablet 1 tablet  1 tablet Oral QHS PRN Schuh, McKenzi P, PA-C       sodium chloride flush (NS) 0.9 % injection 3 mL  3 mL Intravenous Q12H Schuh, McKenzi P, PA-C   3 mL at 10/21/21 0001   sodium chloride flush (NS) 0.9 % injection 5 mL  5 mL Intracatheter Q8H Schuh, McKenzi P, PA-C   5 mL at 10/20/21 1255      Review of Systems: 10 systems reviewed and negative except per interval history/subjective  Physical Exam: Vitals:   10/21/21 0643 10/21/21 0839  BP: (!) 142/78 (!) 135/58  Pulse: 85 85  Resp: 18 (!) 21  Temp: 98.8 F (37.1 C)   SpO2: 100% 94%   No intake/output data recorded.  Intake/Output Summary (Last 24 hours) at 10/21/2021 0902 Last data filed at 10/21/2021 0600 Gross per 24 hour  Intake 360 ml  Output --  Net 360 ml   Constitutional: Chronically ill-appearing, obese, no distress ENMT: ears and nose without scars or lesions, MMM CV: normal rate, edema present on the abdominal wall as well as bilateral stumps Respiratory: clear to auscultation, normal work of breathing Gastrointestinal: soft, non-tender, no palpable masses or hernias Skin: no visible lesions or rashes Psych: alert, judgement/insight appropriate, appropriate mood and affect   Test Results I personally reviewed new and old clinical labs and radiology tests Lab Results  Component Value Date   NA 131 (L) 10/20/2021   K 4.2 10/20/2021   CL 96 (L) 10/20/2021   CO2 22 10/20/2021   BUN 29 (H) 10/20/2021    CREATININE 6.84 (H) 10/20/2021   CALCIUM 7.8 (L) 10/20/2021   ALBUMIN 2.2 (L) 10/17/2021   PHOS 3.8 10/17/2021    CBC Recent Labs  Lab 10/17/21 0222 10/18/21 0413 10/19/21 0800 10/20/21 0129  WBC 9.9 12.0* 13.5* 13.7*  NEUTROABS 5.9  --   --   --   HGB 7.5* 9.3* 9.4* 9.4*  HCT 23.4* 29.5* 29.6* 29.4*  MCV 94.0 93.1 91.6 89.9  PLT 210 187 209 175

## 2021-10-21 NOTE — Progress Notes (Signed)
PT Cancellation Note  Patient Details Name: Courtney Gray MRN: [id number] DOB: [birth year]   Cancelled Treatment:    Reason Eval/Treat Not Completed: (P) Patient declined, no reason specified Pt reports fatigue after dialysis and is discharging to SNF this afternoon.   Jersi Mcmaster B. Migdalia Dk PT, DPT Acute Rehabilitation Services Please use secure chat or  Call Office [phone]   [place] 10/21/2021, 2:45 PM

## 2021-10-21 NOTE — Procedures (Signed)
I was present at this dialysis session. I have reviewed the session itself and made appropriate changes.   Filed Weights   10/19/21 0738 10/19/21 1130 10/20/21 0539  Weight: (!) 147.8 kg (!) 145.3 kg (!) 148 kg    Recent Labs  Lab 10/17/21 0222 10/18/21 0413 10/20/21 0129  NA 127*   < > 131*  K 4.7   < > 4.2  CL 93*   < > 96*  CO2 26   < > 22  GLUCOSE 119*   < > 90  BUN 29*   < > 29*  CREATININE 3.75*   < > 6.84*  CALCIUM 8.2*   < > 7.8*  PHOS 3.8  --   --    < > = values in this interval not displayed.    Recent Labs  Lab 10/17/21 0222 10/18/21 0413 10/19/21 0800 10/20/21 0129  WBC 9.9 12.0* 13.5* 13.7*  NEUTROABS 5.9  --   --   --   HGB 7.5* 9.3* 9.4* 9.4*  HCT 23.4* 29.5* 29.6* 29.4*  MCV 94.0 93.1 91.6 89.9  PLT 210 187 209 175    Scheduled Meds:  atorvastatin  10 mg Oral QHS   calcitRIOL  1 mcg Oral Q T,Th,Sa-HD   Chlorhexidine Gluconate Cloth  6 each Topical Q0600   cinacalcet  30 mg Oral Q T,Th,Sa-HD   darbepoetin (ARANESP) injection - DIALYSIS  200 mcg Intravenous Q Sat-HD   docusate sodium  100 mg Oral Daily   feeding supplement  1 Container Oral TID BM   gabapentin  100 mg Oral Q12H   gabapentin  200 mg Oral Q T,Th,Sa-HD   Gerhardt's butt cream   Topical BID   heparin injection (subcutaneous)  5,000 Units Subcutaneous Q8H   metroNIDAZOLE  500 mg Oral Q12H   multivitamin  1 tablet Oral QHS   pantoprazole  40 mg Oral BID   polyethylene glycol  17 g Oral BID   sodium chloride flush  3 mL Intravenous Q12H   sodium chloride flush  5 mL Intracatheter Q8H   Continuous Infusions:  sodium chloride 10 mL/hr at 10/09/21 1410   ceFEPime (MAXIPIME) IV 2 g (10/19/21 1707)   magnesium sulfate bolus IVPB     PRN Meds:.acetaminophen **OR** acetaminophen, albuterol, alteplase, glucagon (human recombinant), glucose, guaiFENesin-dextromethorphan, heparin, lactulose, lidocaine (PF), lidocaine-prilocaine, magnesium sulfate bolus IVPB, naLOXone (NARCAN)  injection,  nitroGLYCERIN, ondansetron **OR** ondansetron (ZOFRAN) IV, oxyCODONE, oxyCODONE, pentafluoroprop-tetrafluoroeth, phenol, polyethylene glycol, prochlorperazine, senna-docusate   Santiago Bumpers,  MD 10/21/2021, 9:01 AM

## 2021-10-21 NOTE — Progress Notes (Signed)
Pt to d/c to snf today. Contacted CSW to request that snf be advised that pt's chair time was changed as of late Friday afternoon. Pt will go to [facility] SW on MWF with 11:30 chair time. Pt will need to arrive at 10:45 on Wednesday to complete paperwork prior to treatment. AVS corrected with above info. Called clinic and spoke to [place]. Clinic advised of pt's d/c today and that pt will start on Wednesday. Clinic has received orders. Advised clinic that pt will require iv abx with HD and [facility] staff should provide orders. Also advised CSW to remind snf to send a hoyer pad/sling under pt to HD so pt can be lifted from w/c to HD chair if needed.   Melven Sartorius Renal Navigator [phone]

## 2021-10-22 ENCOUNTER — Telehealth: Payer: Self-pay | Admitting: Vascular Surgery

## 2021-10-22 NOTE — Telephone Encounter (Signed)
-----   Message from Karoline Caldwell, [place] sent at 10/17/2021  7:50 AM EDT ----- S/p Left AKA by Dr. Stanford Breed. She needs follow up staple removal in 1 month. thanks

## 2021-10-26 ENCOUNTER — Emergency Department (HOSPITAL_COMMUNITY): Payer: 59

## 2021-10-26 ENCOUNTER — Emergency Department (HOSPITAL_COMMUNITY)
Admission: EM | Admit: 2021-10-26 | Discharge: 2021-10-27 | Disposition: A | Discharge status: Skilled Nursing Facility | Payer: 59 | Attending: Emergency Medicine | Admitting: Emergency Medicine

## 2021-10-26 ENCOUNTER — Other Ambulatory Visit: Payer: Self-pay

## 2021-10-26 DIAGNOSIS — E1122 Type 2 diabetes mellitus with diabetic chronic kidney disease: Secondary | ICD-10-CM | POA: Diagnosis not present

## 2021-10-26 DIAGNOSIS — I120 Hypertensive chronic kidney disease with stage 5 chronic kidney disease or end stage renal disease: Secondary | ICD-10-CM | POA: Diagnosis not present

## 2021-10-26 DIAGNOSIS — N186 End stage renal disease: Secondary | ICD-10-CM | POA: Diagnosis not present

## 2021-10-26 DIAGNOSIS — Z992 Dependence on renal dialysis: Secondary | ICD-10-CM | POA: Diagnosis not present

## 2021-10-26 DIAGNOSIS — Z20822 Contact with and (suspected) exposure to covid-19: Secondary | ICD-10-CM | POA: Insufficient documentation

## 2021-10-26 DIAGNOSIS — F419 Anxiety disorder, unspecified: Secondary | ICD-10-CM | POA: Insufficient documentation

## 2021-10-26 DIAGNOSIS — R0600 Dyspnea, unspecified: Secondary | ICD-10-CM | POA: Diagnosis not present

## 2021-10-26 DIAGNOSIS — R0602 Shortness of breath: Secondary | ICD-10-CM | POA: Diagnosis present

## 2021-10-26 MED ORDER — LORAZEPAM 1 MG PO TABS
1.0000 mg | ORAL_TABLET | Freq: Once | ORAL | Status: AC
  Administered 2021-10-26: 1 mg via ORAL
  Filled 2021-10-26: qty 1

## 2021-10-26 MED ORDER — IPRATROPIUM-ALBUTEROL 0.5-2.5 (3) MG/3ML IN SOLN
3.0000 mL | Freq: Once | RESPIRATORY_TRACT | Status: AC
  Administered 2021-10-26: 3 mL via RESPIRATORY_TRACT
  Filled 2021-10-26: qty 3

## 2021-10-26 NOTE — ED Provider Notes (Signed)
[facility]ERG[facility] DEPARTMENT Provider Note   CSN: [id number] Arrival date & time: 10/26/21  2220     History {Add pertinent medical, surgical, social history, OB history to HPI:1} Chief Complaint  Patient presents with   Shortness of Breath   Anxiety    Courtney Gray is a 51 y.o. female.  Patient as above with significant medical history as below, including ESRD, HFpEF with grade 2 diastolic dysfunction, insulin requiring diabetes mellitus, morbid obesity chronically on 2 L of oxygen, peripheral vascular disease status post right AKA on 5/10. [facility] 7/14.   admitted last month secondary to splenic infarction and septic shock   who presents to the ED with complaint of DIB, missed HD.   Patient normally has dialysis Monday Wednesday Friday, last dialysis session was Wednesday.  Did receive full session.  Dialysis access to [facility] left chest wall.  Unable to make dialysis appointment on Friday secondary to transportation issues, wheelchair was too large to fit into transportation vehicle.  Onset dyspnea approximately 3 hours prior to arrival.  She typically wears 4 L nasal cannula at all times.  Not had to increase her home oxygen rate.  No coughing, congestion, fevers or chills, no nausea or vomiting.  She has chronic pain diffusely but not acutely worsened in the past 12 hours.  No recent medication changes.  Reports compliance with medication.  Patient also reports that she is very anxious.      Past Medical History:  Diagnosis Date   Arthritis    Diabetes mellitus without complication ([place])    Dialysis complication, subsequent encounter    Gout    High cholesterol    Hypertension    Renal disorder     Past Surgical History:  Procedure Laterality Date   ABDOMINAL AORTOGRAM W/LOWER EXTREMITY N/A 08/07/2021   Procedure: ABDOMINAL AORTOGRAM W/LOWER EXTREMITY;  Surgeon: Broadus John, MD;  Location: [facility];  Service: Cardiovascular;  Laterality:  N/A;   AMPUTATION Right 08/14/2021   Procedure: AMPUTATION ABOVE KNEE;  Surgeon: Marty Heck, MD;  Location: [facility];  Service: Vascular;  Laterality: Right;   AMPUTATION Left 09/23/2021   Procedure: AMPUTATION MIDDLE FINGER;  Surgeon: Sherilyn Cooter, MD;  Location: [facility];  Service: Orthopedics;  Laterality: Left;   AMPUTATION Left 10/09/2021   Procedure: LEFT ABOVE KNEE AMPUTATION;  Surgeon: Cherre Robins, MD;  Location: [facility];  Service: Vascular;  Laterality: Left;   AV FISTULA PLACEMENT     x2, unsuccessful.    BUBBLE STUDY  09/27/2021   Procedure: BUBBLE STUDY;  Surgeon: Lelon Perla, MD;  Location: [facility] ENDOSCOPY;  Service: Cardiovascular;;   IR FLUORO GUIDE CV LINE LEFT  12/14/2020   IR FLUORO GUIDE CV LINE LEFT  02/20/2021   IR FLUORO GUIDE CV LINE LEFT  09/11/2021   IR FLUORO GUIDE CV LINE LEFT  10/18/2021   IR FLUORO GUIDE CV LINE RIGHT  08/28/2020   IR FLUORO GUIDE CV LINE RIGHT  05/13/2021   IR FLUORO GUIDE CV LINE RIGHT  05/24/2021   IR PERC TUN PERIT CATH WO PORT S&I /IMAG  09/06/2020   IR PTA VENOUS EXCEPT DIALYSIS CIRCUIT  02/20/2021   IR PTA VENOUS EXCEPT DIALYSIS CIRCUIT  05/13/2021   IR REMOVAL TUN CV CATH W/O FL  05/24/2021   IR REMOVAL TUN CV CATH W/O FL  10/15/2021   IR US GUIDE VASC ACCESS LEFT  09/06/2020   IR US GUIDE VASC ACCESS LEFT  09/11/2021  IR US GUIDE VASC ACCESS LEFT  10/18/2021   IR US GUIDE VASC ACCESS RIGHT  05/24/2021   IR US GUIDE VASC ACCESS RIGHT  09/10/2021   IR VENO/JUGULAR RIGHT  09/10/2021   IR VENOCAVAGRAM SVC  02/20/2021   TEE WITHOUT CARDIOVERSION N/A 09/27/2021   Procedure: TRANSESOPHAGEAL ECHOCARDIOGRAM (TEE);  Surgeon: Lelon Perla, MD;  Location: [facility] ENDOSCOPY;  Service: Cardiovascular;  Laterality: N/A;     The history is provided by the patient. No language interpreter was used.  Shortness of Breath Associated symptoms: no abdominal pain, no chest pain, no cough, no fever, no headaches and no rash   Anxiety Associated symptoms include  shortness of breath. Pertinent negatives include no chest pain, no abdominal pain and no headaches.       Home Medications Prior to Admission medications   Medication Sig Start Date End Date Taking? Authorizing Provider  acetaminophen (TYLENOL) 500 MG tablet Take 1,000 mg by mouth as needed for moderate pain.    [provider]  atorvastatin (LIPITOR) 10 MG tablet Take 1 tablet (10 mg total) by mouth daily. Patient taking differently: Take 10 mg by mouth at bedtime. 11/28/20   Roxan Hockey, MD  calcium carbonate (TUMS SMOOTHIES) 750 MG chewable tablet Chew 1,500 mg by mouth See admin instructions. Take 1500 mg with each meal and 1500 mg with each snack    [provider]  ceFEPIme 2 g in sodium chloride 0.9 % 100 mL Inject 2 g into the vein Every Tuesday,Thursday,and Saturday with dialysis for 17 days. 10/22/21 11/08/21  Dessa Phi, DO  diphenhydrAMINE (BENADRYL) 25 MG tablet Take 25 mg by mouth 2 (two) times daily as needed for allergies.    [provider]  docusate sodium (COLACE) 100 MG capsule Take 100 mg by mouth 2 (two) times daily.    [provider]  doxycycline (VIBRAMYCIN) 100 MG capsule Take 100 mg by mouth 2 (two) times daily. Patient not taking: Reported on 09/10/2021 08/30/21   [provider]  gabapentin (NEURONTIN) 100 MG capsule Take 1 capsule (100 mg total) by mouth 2 (two) times daily. Patient taking differently: Take 100 mg by mouth See admin instructions. Take 100 mg by mouth twice a day on non dialysis days. On dialysis day take 100 mg in the morning, 200 mg after dialysis, and then 100 mg at bedtime. 08/28/21 09/27/21  Darliss Cheney, MD  loperamide (IMODIUM A-D) 2 MG tablet Take 4 mg by mouth as needed for diarrhea or loose stools.    [provider]  metroNIDAZOLE (FLAGYL) 500 MG tablet Take 1 tablet (500 mg total) by mouth every 12 (twelve) hours for 18 days. 10/21/21 11/08/21  Dessa Phi, DO  nitroGLYCERIN  (NITROSTAT) 0.4 MG SL tablet Place 1 tablet (0.4 mg total) under the tongue every 5 (five) minutes as needed for chest pain. 05/24/21   [place] Donnamarie Poag, DO  oxyCODONE (OXY IR/ROXICODONE) 5 MG immediate release tablet Take 1-2 tablets (5-10 mg total) by mouth every 4 (four) hours as needed for moderate pain (pain score 4-6). 10/21/21   Dessa Phi, DO  pantoprazole (PROTONIX) 40 MG tablet Take 1 tablet (40 mg total) by mouth daily. 11/29/20   Roxan Hockey, MD  simethicone (MYLICON) 144 MG chewable tablet Chew 125 mg by mouth in the morning and at bedtime.    [provider]  tiZANidine (ZANAFLEX) 4 MG capsule Take 4 mg by mouth in the morning and at bedtime.    [provider]  Allergies    Benzonatate, Hydralazine, Amoxicillin, Clonidine, Chlorhexidine, Morphine, and Oxycodone    Review of Systems   Review of Systems  Constitutional:  Negative for activity change and fever.  HENT:  Negative for facial swelling and trouble swallowing.   Eyes:  Negative for discharge and redness.  Respiratory:  Positive for chest tightness and shortness of breath. Negative for cough.   Cardiovascular:  Negative for chest pain and palpitations.  Gastrointestinal:  Negative for abdominal pain and nausea.  Genitourinary:  Negative for dysuria and flank pain.  Musculoskeletal:  Negative for back pain and gait problem.  Skin:  Negative for pallor and rash.  Neurological:  Negative for syncope and headaches.  Psychiatric/Behavioral:  The patient is nervous/anxious.     Physical Exam Updated Vital Signs BP (!) 168/74 (BP Location: Right Arm)   Pulse 80   Temp 98.1 F (36.7 C) (Oral)   Resp (!) 21   SpO2 100%  Physical Exam Vitals and nursing note reviewed.  Constitutional:      General: She is not in acute distress.    Appearance: Normal appearance. She is well-developed. She is obese. She is not ill-appearing or diaphoretic.  HENT:     Head: Normocephalic and atraumatic.      Right Ear: External ear normal.     Left Ear: External ear normal.     Nose: Nose normal.     Mouth/Throat:     Mouth: Mucous membranes are moist.  Eyes:     General: No scleral icterus.       Right eye: No discharge.        Left eye: No discharge.  Cardiovascular:     Rate and Rhythm: Normal rate and regular rhythm.     Pulses: Normal pulses.     Heart sounds: Normal heart sounds.  Pulmonary:     Effort: Pulmonary effort is normal. Tachypnea present. No respiratory distress.     Breath sounds: Decreased breath sounds and wheezing present.  Chest:    Abdominal:     General: Abdomen is flat.     Tenderness: There is no abdominal tenderness.  Musculoskeletal:        General: Normal range of motion.     Cervical back: Normal range of motion.     Right lower leg: No edema.     Left lower leg: No edema.  Skin:    General: Skin is warm and dry.     Capillary Refill: Capillary refill takes less than 2 seconds.  Neurological:     Mental Status: She is alert and oriented to person, place, and time.     GCS: GCS eye subscore is 4. GCS verbal subscore is 5. GCS motor subscore is 6.  Psychiatric:        Mood and Affect: Mood is anxious.        Behavior: Behavior normal.     ED Results / Procedures / Treatments   Labs (all labs ordered are listed, but only abnormal results are displayed) Labs Reviewed  RESP PANEL BY RT-PCR (FLU A&B, COVID) ARPGX2  BASIC METABOLIC PANEL  CBC WITH DIFFERENTIAL/PLATELET  BRAIN NATRIURETIC PEPTIDE    EKG EKG Interpretation  Date/Time:  Saturday October 26 2021 22:29:07 EDT Ventricular Rate:  80 PR Interval:  171 QRS Duration: 122 QT Interval:  417 QTC Calculation: 482 R Axis:   53 Text Interpretation: Sinus rhythm Nonspecific intraventricular conduction delay Borderline T abnormalities, inferior leads similar to prior Confirmed by Wynona Dove (  [id number]) on 10/26/2021 11:27:39 PM  Radiology No results found.  Procedures Procedures  {Document  cardiac monitor, telemetry assessment procedure when appropriate:1}  Medications Ordered in ED Medications  ipratropium-albuterol (DUONEB) 0.5-2.5 (3) MG/3ML nebulizer solution 3 mL (has no administration in time range)  LORazepam (ATIVAN) tablet 1 mg (has no administration in time range)    ED Course/ Medical Decision Making/ A&P                           Medical Decision Making Amount and/or Complexity of Data Reviewed Labs: ordered. Radiology: ordered.  Risk Prescription drug management.    CC: dib, missed hd  This patient presents to the Emergency Department for the above complaint. This involves an extensive number of treatment options and is a complaint that carries with it a high risk of complications and morbidity. Vital signs were reviewed. Serious etiologies considered.  In my evaluation of this patient's dyspnea my DDx includes, but is not limited to, pneumonia, pulmonary embolism, pneumothorax, pulmonary edema, metabolic acidosis, asthma, COPD, cardiac cause, anemia, anxiety, etc.   Record review:  Previous records obtained and reviewed prior admission, prior labs and imaging   Additional history obtained from [place] and surgical history as noted above.   Work up as above, notable for:  Labs & imaging results that were available during my care of the patient were visualized by me and considered in my medical decision making.  Physical exam as above.   I ordered imaging studies which included CXR. I visualized the imaging, interpreted images, and I agree with radiologist interpretation. ***  Cardiac monitoring reviewed and interpreted personally which shows NSR   Personally discussed patient care with consultant; ***  Management: ***  ED Course:     Reassessment:  ***  Admission was considered.                Social determinants of health include -  Social History   Socioeconomic History   Marital status: Single    Spouse  name: Not on file   Number of children: Not on file   Years of education: Not on file   Highest education level: Not on file  Occupational History   Not on file  Tobacco Use   Smoking status: Never   Smokeless tobacco: Never  Vaping Use   Vaping Use: Never used  Substance and Sexual Activity   Alcohol use: Never   Drug use: Never   Sexual activity: Not on file  Other Topics Concern   Not on file  Social History Narrative   Not on file   Social Determinants of Health   Financial Resource Strain: Not on file  Food Insecurity: Not on file  Transportation Needs: Not on file  Physical Activity: Not on file  Stress: Not on file  Social Connections: Not on file  Intimate Partner Violence: Not on file      This chart was dictated using voice recognition software.  Despite best efforts to proofread,  errors can occur which can change the documentation meaning.   {Document critical care time when appropriate:1} {Document review of labs and clinical decision tools ie heart score, Chads2Vasc2 etc:1}  {Document your independent review of radiology images, and any outside records:1} {Document your discussion with family members, caretakers, and with consultants:1} {Document social determinants of health affecting pt's care:1} {Document your decision making why or why not admission, treatments were needed:1} Final Clinical Impression(s) / ED  Diagnoses Final diagnoses:  None    Rx / DC Orders ED Discharge Orders     None

## 2021-10-26 NOTE — ED Triage Notes (Signed)
BIB EMS for missed dialysis yesterday due to transportation issues. Pt also complains of shortness of breath starting a couple of hours ago. Normally wears 3L oxygen. PMH: B/L BKA

## 2021-10-27 DIAGNOSIS — R0600 Dyspnea, unspecified: Secondary | ICD-10-CM | POA: Diagnosis not present

## 2021-10-27 LAB — CBC WITH DIFFERENTIAL/PLATELET
Abs Immature Granulocytes: 0.12 10*3/uL — ABNORMAL HIGH (ref 0.00–0.07)
Basophils Absolute: 0.1 10*3/uL (ref 0.0–0.1)
Basophils Relative: 1 %
Eosinophils Absolute: 0.7 10*3/uL — ABNORMAL HIGH (ref 0.0–0.5)
Eosinophils Relative: 5 %
HCT: 30.8 % — ABNORMAL LOW (ref 36.0–46.0)
Hemoglobin: 9.6 g/dL — ABNORMAL LOW (ref 12.0–15.0)
Immature Granulocytes: 1 %
Lymphocytes Relative: 9 %
Lymphs Abs: 1.2 10*3/uL (ref 0.7–4.0)
MCH: 29.3 pg (ref 26.0–34.0)
MCHC: 31.2 g/dL (ref 30.0–36.0)
MCV: 93.9 fL (ref 80.0–100.0)
Monocytes Absolute: 1.5 10*3/uL — ABNORMAL HIGH (ref 0.1–1.0)
Monocytes Relative: 10 %
Neutro Abs: 10.5 10*3/uL — ABNORMAL HIGH (ref 1.7–7.7)
Neutrophils Relative %: 74 %
Platelets: 219 10*3/uL (ref 150–400)
RBC: 3.28 MIL/uL — ABNORMAL LOW (ref 3.87–5.11)
RDW: 18.6 % — ABNORMAL HIGH (ref 11.5–15.5)
WBC: 14 10*3/uL — ABNORMAL HIGH (ref 4.0–10.5)
nRBC: 0 % (ref 0.0–0.2)

## 2021-10-27 LAB — RESP PANEL BY RT-PCR (FLU A&B, COVID) ARPGX2
Influenza A by PCR: NEGATIVE
Influenza B by PCR: NEGATIVE
SARS Coronavirus 2 by RT PCR: NEGATIVE

## 2021-10-27 LAB — BASIC METABOLIC PANEL
Anion gap: 11 (ref 5–15)
BUN: 32 mg/dL — ABNORMAL HIGH (ref 6–20)
CO2: 22 mmol/L (ref 22–32)
Calcium: 7.9 mg/dL — ABNORMAL LOW (ref 8.9–10.3)
Chloride: 94 mmol/L — ABNORMAL LOW (ref 98–111)
Creatinine, Ser: 7.88 mg/dL — ABNORMAL HIGH (ref 0.44–1.00)
GFR, Estimated: 6 mL/min — ABNORMAL LOW (ref >60)
Glucose, Bld: 119 mg/dL — ABNORMAL HIGH (ref 70–99)
Potassium: 3.8 mmol/L (ref 3.5–5.1)
Sodium: 127 mmol/L — ABNORMAL LOW (ref 135–145)

## 2021-10-27 LAB — HEPATITIS B SURFACE ANTIGEN: Hepatitis B Surface Ag: NONREACTIVE

## 2021-10-27 LAB — HEPATITIS C ANTIBODY: HCV Ab: NONREACTIVE

## 2021-10-27 LAB — HEPATITIS B CORE ANTIBODY, TOTAL: Hep B Core Total Ab: NONREACTIVE

## 2021-10-27 LAB — BRAIN NATRIURETIC PEPTIDE: B Natriuretic Peptide: 1414.1 pg/mL — ABNORMAL HIGH (ref 0.0–100.0)

## 2021-10-27 LAB — HEPATITIS B SURFACE ANTIBODY,QUALITATIVE: Hep B S Ab: REACTIVE — AB

## 2021-10-27 MED ORDER — ONDANSETRON HCL 4 MG/2ML IJ SOLN
4.0000 mg | Freq: Once | INTRAMUSCULAR | Status: AC
Start: 2021-10-27 — End: 2021-10-27
  Administered 2021-10-27: 4 mg via INTRAVENOUS
  Filled 2021-10-27: qty 2

## 2021-10-27 MED ORDER — HYDROMORPHONE HCL 1 MG/ML IJ SOLN
0.5000 mg | Freq: Once | INTRAMUSCULAR | Status: AC
  Administered 2021-10-27: 0.5 mg via INTRAVENOUS
  Filled 2021-10-27: qty 1

## 2021-10-27 MED ORDER — OXYCODONE HCL 5 MG PO TABS
5.0000 mg | ORAL_TABLET | ORAL | Status: DC | PRN
  Administered 2021-10-27: 5 mg via ORAL
  Filled 2021-10-27: qty 1

## 2021-10-27 NOTE — ED Notes (Signed)
Report called to [facility] at [place]

## 2021-10-27 NOTE — ED Notes (Signed)
Patient reports feeling anxious, unable to breathe and severe bed sore pain. Attempted to reposition patient due to patient unable to tolerate laying on her side or completely flat, slight pressure was alleviated with pillows.

## 2021-10-27 NOTE — Progress Notes (Signed)
I was present at this dialysis session. I have reviewed the session itself and made appropriate changes.  Was called overnight in regards to patient being in ER. Missed HD on Friday. Volume up/SOB. Tolerated HD during my encounter. Anticipating that she will be discharged post HD. Can resume outpatient HD tomorrow. Please call for formal consultation if patient is to be admitted.  There were no vitals filed for this visit.  Recent Labs  Lab 10/27/21 0016  NA 127*  K 3.8  CL 94*  CO2 22  GLUCOSE 119*  BUN 32*  CREATININE 7.88*  CALCIUM 7.9*    Recent Labs  Lab 10/21/21 1002 10/27/21 0016  WBC 12.4* 14.0*  NEUTROABS  --  10.5*  HGB 9.6* 9.6*  HCT 30.1* 30.8*  MCV 93.2 93.9  PLT 204 219    Scheduled Meds:  Continuous Infusions:  PRN Meds:.oxyCODONE   Gean Quint, MD [facility] 10/27/2021, 12:28 PM

## 2021-10-27 NOTE — ED Notes (Signed)
Consent signs for hemodialysis.

## 2021-10-27 NOTE — ED Notes (Signed)
[facility] called, stated it would be a while

## 2021-10-27 NOTE — Progress Notes (Signed)
Received patient in bed, alert and oriented. Informed consent signed and in chart.  Time tx completed: 3 hrs  HD treatment completed. Patient tolerated well. HD catheter without signs and symptoms of complications. Patient transported back to the ED, alert and orient and in no acute distress. Report given to bedside RN.  Total UF removed: 3500 mls  Medication given: none  Post HD VS:See chart  Post HD weight: on stretcher, unable to obtain.   Goal met, pt stable upon departure.

## 2021-10-27 NOTE — ED Notes (Signed)
Pt off floor to hemodialysis.

## 2021-10-27 NOTE — Discharge Instructions (Signed)
It was a pleasure caring for you today in the emergency department. ° °Please return to the emergency department for any worsening or worrisome symptoms. ° ° °

## 2021-10-27 NOTE — ED Notes (Signed)
Return from hemodialysis.Marland KitchenMarland Kitchen

## 2021-10-27 NOTE — ED Provider Notes (Signed)
Patient is awaiting hemodialysis.  Anticipate discharge after hemodialysis Physical Exam  BP (!) 157/76 (BP Location: Left Wrist)   Pulse 85   Temp 98.1 F (36.7 C) (Oral)   Resp 18   SpO2 97%   Physical Exam  Procedures  Procedures  ED Course / MDM   Clinical Course as of 10/27/21 1313  Sun Oct 27, 2021  0101 Spoke with Dr Candiss Norse, will arrange for HD [SG]    Clinical Course User Index [SG] Jeanell Sparrow, DO   Medical Decision Making Amount and/or Complexity of Data Reviewed Labs: ordered. Radiology: ordered.  Risk Prescription drug management.   Patient returned from dialysis approximately 1245. Patient reports she is feeling improved.  She reports she is ready to go home.  Nursing arranging home transportation.       Charlesetta Shanks, MD 10/27/21 [phone]

## 2021-10-27 NOTE — ED Notes (Signed)
[facility] at bedside 

## 2021-10-29 LAB — HEPATITIS B SURFACE ANTIBODY, QUANTITATIVE: Hep B S AB Quant (Post): 12 m[IU]/mL (ref >9.9)

## 2021-11-01 ENCOUNTER — Other Ambulatory Visit: Payer: Self-pay

## 2021-11-01 ENCOUNTER — Encounter (HOSPITAL_COMMUNITY): Payer: Self-pay

## 2021-11-01 ENCOUNTER — Inpatient Hospital Stay: Payer: 59 | Admitting: Internal Medicine

## 2021-11-01 ENCOUNTER — Telehealth: Payer: Self-pay

## 2021-11-01 ENCOUNTER — Inpatient Hospital Stay (HOSPITAL_COMMUNITY)
Admission: EM | Admit: 2021-11-01 | Discharge: 2022-05-15 | DRG: 252 | Disposition: A | Discharge status: Skilled Nursing Facility | Payer: 59 | Source: Ambulatory Visit | Attending: Internal Medicine | Admitting: Internal Medicine

## 2021-11-01 ENCOUNTER — Emergency Department (HOSPITAL_COMMUNITY): Payer: 59

## 2021-11-01 DIAGNOSIS — K30 Functional dyspepsia: Secondary | ICD-10-CM | POA: Diagnosis not present

## 2021-11-01 DIAGNOSIS — I5033 Acute on chronic diastolic (congestive) heart failure: Secondary | ICD-10-CM | POA: Diagnosis present

## 2021-11-01 DIAGNOSIS — I70203 Unspecified atherosclerosis of native arteries of extremities, bilateral legs: Secondary | ICD-10-CM | POA: Diagnosis present

## 2021-11-01 DIAGNOSIS — J449 Chronic obstructive pulmonary disease, unspecified: Secondary | ICD-10-CM | POA: Diagnosis present

## 2021-11-01 DIAGNOSIS — K750 Abscess of liver: Secondary | ICD-10-CM | POA: Diagnosis present

## 2021-11-01 DIAGNOSIS — E669 Obesity, unspecified: Secondary | ICD-10-CM

## 2021-11-01 DIAGNOSIS — M109 Gout, unspecified: Secondary | ICD-10-CM | POA: Diagnosis present

## 2021-11-01 DIAGNOSIS — H00016 Hordeolum externum left eye, unspecified eyelid: Secondary | ICD-10-CM | POA: Diagnosis not present

## 2021-11-01 DIAGNOSIS — G4700 Insomnia, unspecified: Secondary | ICD-10-CM | POA: Diagnosis not present

## 2021-11-01 DIAGNOSIS — Z992 Dependence on renal dialysis: Secondary | ICD-10-CM

## 2021-11-01 DIAGNOSIS — I739 Peripheral vascular disease, unspecified: Secondary | ICD-10-CM | POA: Diagnosis not present

## 2021-11-01 DIAGNOSIS — M793 Panniculitis, unspecified: Secondary | ICD-10-CM | POA: Diagnosis not present

## 2021-11-01 DIAGNOSIS — E875 Hyperkalemia: Secondary | ICD-10-CM | POA: Diagnosis not present

## 2021-11-01 DIAGNOSIS — D72829 Elevated white blood cell count, unspecified: Secondary | ICD-10-CM | POA: Diagnosis not present

## 2021-11-01 DIAGNOSIS — M898X9 Other specified disorders of bone, unspecified site: Secondary | ICD-10-CM | POA: Diagnosis present

## 2021-11-01 DIAGNOSIS — Z885 Allergy status to narcotic agent status: Secondary | ICD-10-CM

## 2021-11-01 DIAGNOSIS — J9621 Acute and chronic respiratory failure with hypoxia: Secondary | ICD-10-CM | POA: Diagnosis present

## 2021-11-01 DIAGNOSIS — I2510 Atherosclerotic heart disease of native coronary artery without angina pectoris: Secondary | ICD-10-CM | POA: Diagnosis present

## 2021-11-01 DIAGNOSIS — Z5320 Procedure and treatment not carried out because of patient's decision for unspecified reasons: Secondary | ICD-10-CM | POA: Diagnosis not present

## 2021-11-01 DIAGNOSIS — E8770 Fluid overload, unspecified: Secondary | ICD-10-CM

## 2021-11-01 DIAGNOSIS — K521 Toxic gastroenteritis and colitis: Secondary | ICD-10-CM | POA: Diagnosis not present

## 2021-11-01 DIAGNOSIS — I132 Hypertensive heart and chronic kidney disease with heart failure and with stage 5 chronic kidney disease, or end stage renal disease: Secondary | ICD-10-CM | POA: Diagnosis not present

## 2021-11-01 DIAGNOSIS — Z887 Allergy status to serum and vaccine status: Secondary | ICD-10-CM

## 2021-11-01 DIAGNOSIS — E119 Type 2 diabetes mellitus without complications: Secondary | ICD-10-CM | POA: Diagnosis present

## 2021-11-01 DIAGNOSIS — R222 Localized swelling, mass and lump, trunk: Secondary | ICD-10-CM | POA: Diagnosis not present

## 2021-11-01 DIAGNOSIS — N189 Chronic kidney disease, unspecified: Secondary | ICD-10-CM | POA: Diagnosis present

## 2021-11-01 DIAGNOSIS — L8990 Pressure ulcer of unspecified site, unspecified stage: Secondary | ICD-10-CM | POA: Diagnosis present

## 2021-11-01 DIAGNOSIS — E1122 Type 2 diabetes mellitus with diabetic chronic kidney disease: Secondary | ICD-10-CM | POA: Diagnosis present

## 2021-11-01 DIAGNOSIS — Y712 Prosthetic and other implants, materials and accessory cardiovascular devices associated with adverse incidents: Secondary | ICD-10-CM | POA: Diagnosis not present

## 2021-11-01 DIAGNOSIS — M86651 Other chronic osteomyelitis, right thigh: Secondary | ICD-10-CM | POA: Diagnosis present

## 2021-11-01 DIAGNOSIS — Z89611 Acquired absence of right leg above knee: Secondary | ICD-10-CM | POA: Diagnosis not present

## 2021-11-01 DIAGNOSIS — K581 Irritable bowel syndrome with constipation: Secondary | ICD-10-CM | POA: Diagnosis not present

## 2021-11-01 DIAGNOSIS — N183 Chronic kidney disease, stage 3 unspecified: Secondary | ICD-10-CM | POA: Diagnosis not present

## 2021-11-01 DIAGNOSIS — T3695XA Adverse effect of unspecified systemic antibiotic, initial encounter: Secondary | ICD-10-CM | POA: Diagnosis not present

## 2021-11-01 DIAGNOSIS — L89312 Pressure ulcer of right buttock, stage 2: Secondary | ICD-10-CM | POA: Diagnosis present

## 2021-11-01 DIAGNOSIS — Z89612 Acquired absence of left leg above knee: Secondary | ICD-10-CM | POA: Diagnosis not present

## 2021-11-01 DIAGNOSIS — I161 Hypertensive emergency: Secondary | ICD-10-CM | POA: Diagnosis present

## 2021-11-01 DIAGNOSIS — Z91158 Patient's noncompliance with renal dialysis for other reason: Secondary | ICD-10-CM

## 2021-11-01 DIAGNOSIS — S68113A Complete traumatic metacarpophalangeal amputation of left middle finger, initial encounter: Secondary | ICD-10-CM | POA: Diagnosis present

## 2021-11-01 DIAGNOSIS — J00 Acute nasopharyngitis [common cold]: Secondary | ICD-10-CM | POA: Diagnosis not present

## 2021-11-01 DIAGNOSIS — E785 Hyperlipidemia, unspecified: Secondary | ICD-10-CM | POA: Diagnosis present

## 2021-11-01 DIAGNOSIS — H01006 Unspecified blepharitis left eye, unspecified eyelid: Secondary | ICD-10-CM | POA: Diagnosis not present

## 2021-11-01 DIAGNOSIS — G894 Chronic pain syndrome: Secondary | ICD-10-CM | POA: Diagnosis present

## 2021-11-01 DIAGNOSIS — Z7401 Bed confinement status: Secondary | ICD-10-CM

## 2021-11-01 DIAGNOSIS — E7800 Pure hypercholesterolemia, unspecified: Secondary | ICD-10-CM | POA: Diagnosis present

## 2021-11-01 DIAGNOSIS — T8241XA Breakdown (mechanical) of vascular dialysis catheter, initial encounter: Secondary | ICD-10-CM | POA: Diagnosis not present

## 2021-11-01 DIAGNOSIS — N186 End stage renal disease: Secondary | ICD-10-CM | POA: Diagnosis present

## 2021-11-01 DIAGNOSIS — Z6841 Body Mass Index (BMI) 40.0 and over, adult: Secondary | ICD-10-CM | POA: Diagnosis not present

## 2021-11-01 DIAGNOSIS — E1169 Type 2 diabetes mellitus with other specified complication: Secondary | ICD-10-CM | POA: Diagnosis present

## 2021-11-01 DIAGNOSIS — E039 Hypothyroidism, unspecified: Secondary | ICD-10-CM | POA: Diagnosis present

## 2021-11-01 DIAGNOSIS — R519 Headache, unspecified: Secondary | ICD-10-CM | POA: Diagnosis not present

## 2021-11-01 DIAGNOSIS — I953 Hypotension of hemodialysis: Secondary | ICD-10-CM | POA: Diagnosis not present

## 2021-11-01 DIAGNOSIS — T472X5A Adverse effect of stimulant laxatives, initial encounter: Secondary | ICD-10-CM | POA: Diagnosis not present

## 2021-11-01 DIAGNOSIS — Z89022 Acquired absence of left finger(s): Secondary | ICD-10-CM

## 2021-11-01 DIAGNOSIS — E871 Hypo-osmolality and hyponatremia: Secondary | ICD-10-CM | POA: Diagnosis present

## 2021-11-01 DIAGNOSIS — R7881 Bacteremia: Secondary | ICD-10-CM | POA: Diagnosis present

## 2021-11-01 DIAGNOSIS — N2581 Secondary hyperparathyroidism of renal origin: Secondary | ICD-10-CM | POA: Diagnosis present

## 2021-11-01 DIAGNOSIS — L89152 Pressure ulcer of sacral region, stage 2: Secondary | ICD-10-CM | POA: Diagnosis present

## 2021-11-01 DIAGNOSIS — N1832 Chronic kidney disease, stage 3b: Secondary | ICD-10-CM | POA: Diagnosis not present

## 2021-11-01 DIAGNOSIS — I10 Essential (primary) hypertension: Secondary | ICD-10-CM | POA: Diagnosis present

## 2021-11-01 DIAGNOSIS — Z91199 Patient's noncompliance with other medical treatment and regimen due to unspecified reason: Secondary | ICD-10-CM

## 2021-11-01 DIAGNOSIS — I5023 Acute on chronic systolic (congestive) heart failure: Secondary | ICD-10-CM | POA: Diagnosis not present

## 2021-11-01 DIAGNOSIS — Z833 Family history of diabetes mellitus: Secondary | ICD-10-CM

## 2021-11-01 DIAGNOSIS — M545 Low back pain, unspecified: Secondary | ICD-10-CM | POA: Diagnosis not present

## 2021-11-01 DIAGNOSIS — Z9981 Dependence on supplemental oxygen: Secondary | ICD-10-CM

## 2021-11-01 DIAGNOSIS — L89153 Pressure ulcer of sacral region, stage 3: Secondary | ICD-10-CM | POA: Diagnosis not present

## 2021-11-01 DIAGNOSIS — Z79890 Hormone replacement therapy (postmenopausal): Secondary | ICD-10-CM

## 2021-11-01 DIAGNOSIS — Z1152 Encounter for screening for COVID-19: Secondary | ICD-10-CM

## 2021-11-01 DIAGNOSIS — E1151 Type 2 diabetes mellitus with diabetic peripheral angiopathy without gangrene: Secondary | ICD-10-CM | POA: Diagnosis present

## 2021-11-01 DIAGNOSIS — D733 Abscess of spleen: Secondary | ICD-10-CM | POA: Diagnosis present

## 2021-11-01 DIAGNOSIS — Z79899 Other long term (current) drug therapy: Secondary | ICD-10-CM

## 2021-11-01 DIAGNOSIS — K219 Gastro-esophageal reflux disease without esophagitis: Secondary | ICD-10-CM | POA: Diagnosis not present

## 2021-11-01 DIAGNOSIS — R5381 Other malaise: Secondary | ICD-10-CM | POA: Diagnosis not present

## 2021-11-01 DIAGNOSIS — Z880 Allergy status to penicillin: Secondary | ICD-10-CM

## 2021-11-01 DIAGNOSIS — J9611 Chronic respiratory failure with hypoxia: Secondary | ICD-10-CM | POA: Diagnosis not present

## 2021-11-01 DIAGNOSIS — I252 Old myocardial infarction: Secondary | ICD-10-CM

## 2021-11-01 DIAGNOSIS — Z888 Allergy status to other drugs, medicaments and biological substances status: Secondary | ICD-10-CM

## 2021-11-01 DIAGNOSIS — E1140 Type 2 diabetes mellitus with diabetic neuropathy, unspecified: Secondary | ICD-10-CM | POA: Diagnosis present

## 2021-11-01 DIAGNOSIS — J810 Acute pulmonary edema: Secondary | ICD-10-CM | POA: Diagnosis present

## 2021-11-01 DIAGNOSIS — I159 Secondary hypertension, unspecified: Secondary | ICD-10-CM | POA: Diagnosis not present

## 2021-11-01 DIAGNOSIS — E876 Hypokalemia: Secondary | ICD-10-CM | POA: Diagnosis not present

## 2021-11-01 DIAGNOSIS — K589 Irritable bowel syndrome without diarrhea: Secondary | ICD-10-CM | POA: Diagnosis present

## 2021-11-01 DIAGNOSIS — B966 Bacteroides fragilis [B. fragilis] as the cause of diseases classified elsewhere: Secondary | ICD-10-CM | POA: Diagnosis present

## 2021-11-01 DIAGNOSIS — D631 Anemia in chronic kidney disease: Secondary | ICD-10-CM | POA: Diagnosis present

## 2021-11-01 DIAGNOSIS — F419 Anxiety disorder, unspecified: Secondary | ICD-10-CM | POA: Diagnosis not present

## 2021-11-01 DIAGNOSIS — G546 Phantom limb syndrome with pain: Secondary | ICD-10-CM | POA: Diagnosis present

## 2021-11-01 DIAGNOSIS — K5900 Constipation, unspecified: Secondary | ICD-10-CM | POA: Diagnosis not present

## 2021-11-01 LAB — COMPREHENSIVE METABOLIC PANEL
ALT: 8 U/L (ref 0–44)
AST: 33 U/L (ref 15–41)
Albumin: 2.2 g/dL — ABNORMAL LOW (ref 3.5–5.0)
Alkaline Phosphatase: 72 U/L (ref 38–126)
Anion gap: 13 (ref 5–15)
BUN: 28 mg/dL — ABNORMAL HIGH (ref 6–20)
CO2: 23 mmol/L (ref 22–32)
Calcium: 8 mg/dL — ABNORMAL LOW (ref 8.9–10.3)
Chloride: 93 mmol/L — ABNORMAL LOW (ref 98–111)
Creatinine, Ser: 5.71 mg/dL — ABNORMAL HIGH (ref 0.44–1.00)
GFR, Estimated: 8 mL/min — ABNORMAL LOW (ref >60)
Glucose, Bld: 140 mg/dL — ABNORMAL HIGH (ref 70–99)
Potassium: 5 mmol/L (ref 3.5–5.1)
Sodium: 129 mmol/L — ABNORMAL LOW (ref 135–145)
Total Bilirubin: 1.5 mg/dL — ABNORMAL HIGH (ref 0.3–1.2)
Total Protein: 6.6 g/dL (ref 6.5–8.1)

## 2021-11-01 LAB — CBC WITH DIFFERENTIAL/PLATELET
Abs Immature Granulocytes: 0.04 10*3/uL (ref 0.00–0.07)
Basophils Absolute: 0 10*3/uL (ref 0.0–0.1)
Basophils Relative: 1 %
Eosinophils Absolute: 0.2 10*3/uL (ref 0.0–0.5)
Eosinophils Relative: 2 %
HCT: 49.9 % — ABNORMAL HIGH (ref 36.0–46.0)
Hemoglobin: 15.1 g/dL — ABNORMAL HIGH (ref 12.0–15.0)
Immature Granulocytes: 1 %
Lymphocytes Relative: 8 %
Lymphs Abs: 0.6 10*3/uL — ABNORMAL LOW (ref 0.7–4.0)
MCH: 28.7 pg (ref 26.0–34.0)
MCHC: 30.3 g/dL (ref 30.0–36.0)
MCV: 94.7 fL (ref 80.0–100.0)
Monocytes Absolute: 0.8 10*3/uL (ref 0.1–1.0)
Monocytes Relative: 11 %
Neutro Abs: 5.7 10*3/uL (ref 1.7–7.7)
Neutrophils Relative %: 77 %
Platelets: 152 10*3/uL (ref 150–400)
RBC: 5.27 MIL/uL — ABNORMAL HIGH (ref 3.87–5.11)
RDW: 18 % — ABNORMAL HIGH (ref 11.5–15.5)
WBC: 7.3 10*3/uL (ref 4.0–10.5)
nRBC: 0 % (ref 0.0–0.2)

## 2021-11-01 LAB — GLUCOSE, CAPILLARY: Glucose-Capillary: 159 mg/dL — ABNORMAL HIGH (ref 70–99)

## 2021-11-01 MED ORDER — PENTAFLUOROPROP-TETRAFLUOROETH EX AERO: 1.0000 | INHALATION_SPRAY | CUTANEOUS | Status: DC | PRN

## 2021-11-01 MED ORDER — SODIUM CHLORIDE 0.9 % IV SOLN
1.0000 g | Freq: Once | INTRAVENOUS | Status: AC
  Administered 2021-11-01: 1 g via INTRAVENOUS
  Filled 2021-11-01: qty 10

## 2021-11-01 MED ORDER — DOCUSATE SODIUM 100 MG PO CAPS
100.0000 mg | ORAL_CAPSULE | Freq: Two times a day (BID) | ORAL | Status: DC
  Administered 2021-11-01 – 2021-11-27 (×49): 100 mg via ORAL
  Filled 2021-11-01 (×50): qty 1

## 2021-11-01 MED ORDER — TIZANIDINE HCL 2 MG PO TABS
4.0000 mg | ORAL_TABLET | Freq: Three times a day (TID) | ORAL | Status: DC
  Administered 2021-11-01 – 2021-11-03 (×4): 4 mg via ORAL
  Filled 2021-11-01 (×4): qty 2
  Filled 2021-11-01: qty 1

## 2021-11-01 MED ORDER — GABAPENTIN 100 MG PO CAPS
100.0000 mg | ORAL_CAPSULE | Freq: Two times a day (BID) | ORAL | Status: DC
  Administered 2021-11-01 – 2021-11-28 (×54): 100 mg via ORAL
  Filled 2021-11-01 (×54): qty 1

## 2021-11-01 MED ORDER — SODIUM CHLORIDE 0.9 % IV SOLN: 2.0000 g | INTRAVENOUS | Status: DC

## 2021-11-01 MED ORDER — SODIUM CHLORIDE 0.9 % IV SOLN
1.0000 g | INTRAVENOUS | Status: DC
  Filled 2021-11-01: qty 10

## 2021-11-01 MED ORDER — ACETAMINOPHEN 500 MG PO TABS
1000.0000 mg | ORAL_TABLET | ORAL | Status: DC | PRN
  Administered 2021-11-22: 1000 mg via ORAL
  Filled 2021-11-01: qty 2

## 2021-11-01 MED ORDER — ATORVASTATIN CALCIUM 10 MG PO TABS
10.0000 mg | ORAL_TABLET | Freq: Every day | ORAL | Status: DC
  Administered 2021-11-02 – 2022-05-15 (×186): 10 mg via ORAL
  Filled 2021-11-01 (×188): qty 1

## 2021-11-01 MED ORDER — HEPARIN SODIUM (PORCINE) 5000 UNIT/ML IJ SOLN
5000.0000 [IU] | Freq: Two times a day (BID) | INTRAMUSCULAR | Status: DC
Start: 2021-11-01 — End: 2021-12-01
  Administered 2021-11-01 – 2021-11-30 (×58): 5000 [IU] via SUBCUTANEOUS
  Filled 2021-11-01 (×57): qty 1

## 2021-11-01 MED ORDER — OXYCODONE HCL 5 MG PO TABS
5.0000 mg | ORAL_TABLET | ORAL | Status: DC | PRN
  Administered 2021-11-01 – 2021-11-03 (×4): 10 mg via ORAL
  Administered 2021-11-04: 5 mg via ORAL
  Administered 2021-11-04 – 2021-11-17 (×9): 10 mg via ORAL
  Administered 2021-11-18 – 2021-11-19 (×5): 5 mg via ORAL
  Administered 2021-11-20: 10 mg via ORAL
  Administered 2021-11-20: 5 mg via ORAL
  Administered 2021-11-21: 10 mg via ORAL
  Administered 2021-11-21: 5 mg via ORAL
  Administered 2021-11-21: 10 mg via ORAL
  Administered 2021-11-22: 5 mg via ORAL
  Administered 2021-11-23: 10 mg via ORAL
  Administered 2021-11-23: 5 mg via ORAL
  Administered 2021-11-24 – 2021-11-29 (×9): 10 mg via ORAL
  Administered 2021-11-30: 5 mg via ORAL
  Administered 2021-11-30: 10 mg via ORAL
  Administered 2021-11-30: 5 mg via ORAL
  Administered 2021-12-01: 10 mg via ORAL
  Administered 2021-12-02: 5 mg via ORAL
  Administered 2021-12-02 – 2021-12-05 (×6): 10 mg via ORAL
  Administered 2021-12-06: 5 mg via ORAL
  Administered 2021-12-06: 10 mg via ORAL
  Administered 2021-12-07: 5 mg via ORAL
  Administered 2021-12-13 – 2021-12-23 (×14): 10 mg via ORAL
  Administered 2021-12-24: 5 mg via ORAL
  Administered 2021-12-25 – 2022-01-04 (×10): 10 mg via ORAL
  Filled 2021-11-01 (×5): qty 2
  Filled 2021-11-01 (×3): qty 1
  Filled 2021-11-01 (×23): qty 2
  Filled 2021-11-01: qty 1
  Filled 2021-11-01: qty 2
  Filled 2021-11-01: qty 1
  Filled 2021-11-01 (×2): qty 2
  Filled 2021-11-01: qty 1
  Filled 2021-11-01 (×6): qty 2
  Filled 2021-11-01: qty 1
  Filled 2021-11-01 (×2): qty 2
  Filled 2021-11-01: qty 1
  Filled 2021-11-01 (×10): qty 2
  Filled 2021-11-01: qty 1
  Filled 2021-11-01 (×7): qty 2
  Filled 2021-11-01 (×2): qty 1
  Filled 2021-11-01 (×2): qty 2
  Filled 2021-11-01: qty 1
  Filled 2021-11-01 (×7): qty 2
  Filled 2021-11-01: qty 1
  Filled 2021-11-01 (×5): qty 2
  Filled 2021-11-01 (×2): qty 1
  Filled 2021-11-01 (×4): qty 2

## 2021-11-01 MED ORDER — CALCIUM CARBONATE ANTACID 500 MG PO CHEW: 1500.0000 mg | CHEWABLE_TABLET | ORAL | Status: DC

## 2021-11-01 MED ORDER — ISOSORBIDE MONONITRATE ER 30 MG PO TB24
30.0000 mg | ORAL_TABLET | Freq: Every day | ORAL | Status: DC
  Administered 2021-11-02 – 2021-11-14 (×10): 30 mg via ORAL
  Filled 2021-11-01 (×13): qty 1

## 2021-11-01 MED ORDER — HEPARIN SODIUM (PORCINE) 1000 UNIT/ML DIALYSIS
1000.0000 [IU] | INTRAMUSCULAR | Status: DC | PRN
  Filled 2021-11-01: qty 1

## 2021-11-01 MED ORDER — LIDOCAINE HCL (PF) 1 % IJ SOLN: 5.0000 mL | INTRAMUSCULAR | Status: DC | PRN

## 2021-11-01 MED ORDER — AMLODIPINE BESYLATE 5 MG PO TABS
5.0000 mg | ORAL_TABLET | Freq: Every day | ORAL | Status: DC
Start: 2021-11-01 — End: 2021-11-01

## 2021-11-01 MED ORDER — METRONIDAZOLE 500 MG PO TABS
500.0000 mg | ORAL_TABLET | Freq: Two times a day (BID) | ORAL | Status: AC
Start: 2021-11-01 — End: 2021-11-08
  Administered 2021-11-01 – 2021-11-08 (×15): 500 mg via ORAL
  Filled 2021-11-01 (×15): qty 1

## 2021-11-01 MED ORDER — PANTOPRAZOLE SODIUM 40 MG PO TBEC
40.0000 mg | DELAYED_RELEASE_TABLET | Freq: Every day | ORAL | Status: DC
  Administered 2021-11-02 – 2021-12-11 (×39): 40 mg via ORAL
  Filled 2021-11-01 (×41): qty 1

## 2021-11-01 MED ORDER — LIDOCAINE-PRILOCAINE 2.5-2.5 % EX CREA: 1.0000 | TOPICAL_CREAM | CUTANEOUS | Status: DC | PRN

## 2021-11-01 MED ORDER — CALCIUM CARBONATE ANTACID 500 MG PO CHEW
1500.0000 mg | CHEWABLE_TABLET | Freq: Three times a day (TID) | ORAL | Status: DC
  Administered 2021-11-02 – 2021-11-03 (×4): 1500 mg via ORAL
  Filled 2021-11-01 (×3): qty 8

## 2021-11-01 MED ORDER — ALTEPLASE 2 MG IJ SOLR: 2.0000 mg | Freq: Once | INTRAMUSCULAR | Status: DC | PRN

## 2021-11-01 NOTE — ED Notes (Signed)
Admitting paged to get a bed request for the patient

## 2021-11-01 NOTE — ED Triage Notes (Signed)
Per EMS patient is scheduled for dialysis MWF. Patient  is picked up at dialysis today. Per EMS patients dialysis center called the hospital administrator at [facility] where the patient stays and left voicemail's. Patient is coming from [facility]. Patient did not receive dialysis today because the kidney center refused to see her. Per Kidney center the patient is to come to the ER because "something happened on Wednesday" and that she needs to be seen by a doctor. Patient reports that she doesn't have any idea what happened on Wednesday and she received her full treatment then. Patient and EMS report that the patient has no complaints and the patient reports that she feels normal at baseline.

## 2021-11-01 NOTE — ED Provider Notes (Signed)
[facility] EMERGENCY DEPARTMENT Provider Note   CSN: [id number] Arrival date & time: 11/01/21  1206     History  Chief Complaint  Patient presents with   Vascular Access Problem    Courtney Gray is a 51 y.o. female.  HPI Patient presents by EMS for evaluation of a problem related to end-stage renal disease.  The patient is unaware of why she was transferred here.  She had presented to her outpatient dialysis unit today for dialysis, but they were unable to accommodate her, so she was sent here.  Patient states she has been well and last dialyzed using the catheter in her left upper chest wall, 2 days ago.  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  She does not currently have a functioning wheelchair which makes transfer and mobility difficult.  She was in the emergency department 6 days ago and was dialyzed at that time.    Home Medications Prior to Admission medications   Medication Sig Start Date End Date Taking? Authorizing Provider  acetaminophen (TYLENOL) 500 MG tablet Take 1,000 mg by mouth as needed for moderate pain.    [provider]  atorvastatin (LIPITOR) 10 MG tablet Take 1 tablet (10 mg total) by mouth daily. Patient taking differently: Take 10 mg by mouth at bedtime. 11/28/20   Roxan Hockey, MD  calcium carbonate (TUMS SMOOTHIES) 750 MG chewable tablet Chew 1,500 mg by mouth See admin instructions. Take 1500 mg with each meal and 1500 mg with each snack    [provider]  ceFEPIme 2 g in sodium chloride 0.9 % 100 mL Inject 2 g into the vein Every Tuesday,Thursday,and Saturday with dialysis for 17 days. 10/22/21 11/08/21  Dessa Phi, DO  diphenhydrAMINE (BENADRYL) 25 MG tablet Take 25 mg by mouth 2 (two) times daily as needed for allergies.    [provider]  docusate sodium (COLACE) 100 MG capsule Take 100 mg by mouth 2 (two) times daily.    [provider]  doxycycline  (VIBRAMYCIN) 100 MG capsule Take 100 mg by mouth 2 (two) times daily. Patient not taking: Reported on 09/10/2021 08/30/21   [provider]  gabapentin (NEURONTIN) 100 MG capsule Take 1 capsule (100 mg total) by mouth 2 (two) times daily. Patient taking differently: Take 100 mg by mouth See admin instructions. Take 100 mg by mouth twice a day on non dialysis days. On dialysis day take 100 mg in the morning, 200 mg after dialysis, and then 100 mg at bedtime. 08/28/21 09/27/21  Darliss Cheney, MD  loperamide (IMODIUM A-D) 2 MG tablet Take 4 mg by mouth as needed for diarrhea or loose stools.    [provider]  metroNIDAZOLE (FLAGYL) 500 MG tablet Take 1 tablet (500 mg total) by mouth every 12 (twelve) hours for 18 days. 10/21/21 11/08/21  Dessa Phi, DO  nitroGLYCERIN (NITROSTAT) 0.4 MG SL tablet Place 1 tablet (0.4 mg total) under the tongue every 5 (five) minutes as needed for chest pain. 05/24/21   [place] Donnamarie Poag, DO  oxyCODONE (OXY IR/ROXICODONE) 5 MG immediate release tablet Take 1-2 tablets (5-10 mg total) by mouth every 4 (four) hours as needed for moderate pain (pain score 4-6). 10/21/21   Dessa Phi, DO  pantoprazole (PROTONIX) 40 MG tablet Take 1 tablet (40 mg total) by mouth daily. 11/29/20   Roxan Hockey, MD  simethicone (MYLICON) 193 MG chewable tablet Chew 125 mg by mouth in the morning and at bedtime.  [provider]  tiZANidine (ZANAFLEX) 4 MG capsule Take 4 mg by mouth in the morning and at bedtime.    [provider]      Allergies    Benzonatate, Hydralazine, Amoxicillin, Clonidine, Chlorhexidine, Morphine, and Oxycodone    Review of Systems   Review of Systems  Physical Exam Updated Vital Signs BP (!) 183/53   Pulse 66   Temp 98.3 F (36.8 C) (Oral)   Resp 18   SpO2 100%  Physical Exam Vitals and nursing note reviewed.  Constitutional:      General: She is not in acute distress.    Appearance: She is well-developed. She is  obese. She is not ill-appearing.  HENT:     Head: Normocephalic and atraumatic.     Right Ear: External ear normal.     Left Ear: External ear normal.  Eyes:     Conjunctiva/sclera: Conjunctivae normal.     Pupils: Pupils are equal, round, and reactive to light.  Neck:     Trachea: Phonation normal.  Cardiovascular:     Rate and Rhythm: Normal rate.  Pulmonary:     Effort: Pulmonary effort is normal.  Musculoskeletal:        General: Normal range of motion.     Cervical back: Normal range of motion and neck supple.     Comments: Drain tube in stump of left above-knee amputation.  Draining serosanguineous material  Skin:    General: Skin is warm and dry.     Comments: Right buttocks with 2 superficial pressure sores, stage I-II, 1 is about 2 cm in diameter the other is about 4 cm in diameter.  There is mild skin break of the opposite buttock, without full-thickness injury.  No areas of cellulitis associated with these wounds.  Neurological:     Mental Status: She is alert and oriented to person, place, and time.     Cranial Nerves: No cranial nerve deficit.     Sensory: No sensory deficit.     Motor: No abnormal muscle tone.     Coordination: Coordination normal.  Psychiatric:        Mood and Affect: Mood normal.        Behavior: Behavior normal.        Thought Content: Thought content normal.        Judgment: Judgment normal.     ED Results / Procedures / Treatments   Labs (all labs ordered are listed, but only abnormal results are displayed) Labs Reviewed  CBC WITH DIFFERENTIAL/PLATELET - Abnormal; Notable for the following components:      Result Value   RBC 5.27 (*)    Hemoglobin 15.1 (*)    HCT 49.9 (*)    RDW 18.0 (*)    Lymphs Abs 0.6 (*)    All other components within normal limits    EKG None  Radiology No results found.  Procedures Procedures    Medications Ordered in ED Medications - No data to display  ED Course/ Medical Decision Making/  A&P Clinical Course as of 11/01/21 1702  Fri Nov 01, 2021  1637 I had a secondary discussion with nephrology regarding the patient's status.  At this time it has been determined that she cannot be managed as an outpatient in a dialysis facility, until other measures are arranged. [EW]    Clinical Course User Index [EW] Daleen Bo, MD  Medical Decision Making Patient presents for inability to be dialyzed today.  According to nephrology, Dr. Soyla Murphy, patient cannot be accommodated at the outpatient sodium due to her body habitus and obesity.  He recommends admitting the patient for management and placement in a long-term acute care facility.  He will evaluate the patient in the emergency department, for possible dialysis today versus setting up a treatment program while she is here.  Screening labs obtained in order to get the patient admitted.  Patient does not appear in respiratory distress and does not need immediate dialysis.  Amount and/or Complexity of Data Reviewed Independent Historian:     Details: She is a cogent historian Labs: ordered.    Details: CBC, metabolic panel-CBC, metabolic panel Discussion of management or test interpretation with external provider(s): Consult nephrology, Dr. Melvia Heaps will see the patient.  He recommends admission to the hospital.  He anticipates patient will require LTAC because she cannot be managed in an outpatient dialysis facility at this time.  Consult social work, to initiate discussion for placement.  They are unable to place the patient from the ED.  They will continue to follow.  Risk Decision regarding hospitalization. Risk Details: Patient presenting for evaluation of needing dialysis.  She cannot be accommodated at the outpatient dialysis facility due to her body habitus/obesity.  Nephrology consulted regarding need for urgent dialysis.  Hospital admitting team consulted to manage patient until appropriate disposition  can be accomplished.           Final Clinical Impression(s) / ED Diagnoses Final diagnoses:  End stage renal disease ([place])  Obesity with serious comorbidity, unspecified classification, unspecified obesity type  Pressure injury of right buttock, stage 2 ([place])    Rx / DC Orders ED Discharge Orders     None         Daleen Bo, MD 11/01/21 1702

## 2021-11-01 NOTE — ED Notes (Signed)
Nasal 02 increased per the pts request  she rep[orts that she is usually on 4 liters  adjusted to that  po 1005 ON THE 4 LITERS OF NASAL 02

## 2021-11-01 NOTE — Telephone Encounter (Signed)
Patient returned call, states she will have to call back to reschedule. She has dialysis M/W/F and will need to coordinate with her facility.   Beryle Flock, RN

## 2021-11-01 NOTE — Progress Notes (Addendum)
Received a call from staff at [facility]. Advised that pt's out-pt HD clinic unable to provide HD to pt due to pt not being appropriate for out-pt HD. Contacted [facility] and spoke to clinic manager who confirms that clinic is unable to safely provide treatment to pt at this time. Clinic feels pt is not appropriate for out-pt HD at this time due to safety concerns. Pt is known to navigator from pt's previous admissions. Pt had been receiving out-pt HD at [facility] until pt placed at snf in [place]. Will assist as needed.   Melven Sartorius Renal Navigator [phone]  Addendum at 4:30 pm: Contacted by ED RN CM that snf is willing to accept pt back with use of geri-chair for out-pt HD. Spoke to clinic manager at [facility] who states that clinic is unable to provide care safely in a geri-chair and unable to accept pt back. Case discussed with Loma Sousa, renal NP. Pt is not appropriate for out-pt HD via HD chair at this time. Pt is in need of stretcher HD. Will start the process on Monday to attempt to locate an out-pt clinic that could possibly accept pt for HD via stretcher. Renal NP to contact ED RN CM as well to discuss pt's care needs.

## 2021-11-01 NOTE — ED Notes (Signed)
Tried to get patient blood, I had no success. Nurse was informed.

## 2021-11-01 NOTE — ED Notes (Signed)
Phlebotomy called at this time.  

## 2021-11-01 NOTE — Consult Note (Addendum)
[facility] Renal Consultation Note    Indication for Consultation:  Management of ESRD/hemodialysis; anemia, hypertension/volume and secondary hyperparathyroidism  TKZ:SWFU, Ashish, MD  HPI: Courtney Gray is a 51 y.o. female with ESRD-recent transfer from [facility] to [facility] MWF. 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 Carlis Abbott, left AKA performed 7/5 Dr. Stanford Breed, and L mid-finger amputated 09/23/21 by Dr. Tempie Donning.  Noted recent long hospitalization 09/09/21-10/21/21 for splenic infarct and other issues including: septic shock 2nd bacteremia-was receiving IV cefepime in outpatient HD until 11/08/21, followed by ID, hepatic abscesses-s/p LUQ splenic drain, followed by IR.  Discussed case with Dr. Moshe Cipro. Have been informed that it takes 4 staff members and 2 hoyer pads to safely transfer patient from her transportation to our HD chair. Also been informed on 10/30/21, the fire department was called to assist EMS to transfer her from HD chair to her transportation. Due to her multiple decubitus ulcers, she's been unable to complete full HD treatment in our chair d/t ongoing pain. Unfortunately due to these issues, she's been declared not a good candidate for outpatient dialysis, thus, she has returned today back to the ED. She requires hemodialysis in the bed and will need to be admitted to determine new placement. I have discussed this with the ED provider, Renal Navigator, and Case Manager in the ED department. Her last HD was on 10/30/21. Labs include: SrCr 7.88, BUN  32, K+ 3.8, Ca 7.9, Na 127, Hgb 15.5, and BNP 1414. She is not in acute respiratory distress so no emergent need for HD this evening. Plan to resume her hemodialysis tomorrow morning.    Past Medical History:  Diagnosis Date   Arthritis    Diabetes mellitus without complication ([place])    Dialysis complication, subsequent encounter    Gout    High cholesterol    Hypertension    Renal disorder    Past Surgical History:  Procedure Laterality Date   ABDOMINAL AORTOGRAM W/LOWER EXTREMITY N/A 08/07/2021   Procedure: ABDOMINAL AORTOGRAM W/LOWER EXTREMITY;  Surgeon: Broadus John, MD;  Location: [facility];  Service: Cardiovascular;  Laterality: N/A;   AMPUTATION Right 08/14/2021   Procedure: AMPUTATION ABOVE KNEE;  Surgeon: Marty Heck, MD;  Location: [facility];  Service: Vascular;  Laterality: Right;   AMPUTATION Left 09/23/2021   Procedure: AMPUTATION MIDDLE FINGER;  Surgeon: Sherilyn Cooter, MD;  Location: [facility];  Service: Orthopedics;  Laterality: Left;   AMPUTATION Left 10/09/2021   Procedure: LEFT ABOVE KNEE AMPUTATION;  Surgeon: Cherre Robins, MD;  Location: [facility];  Service: Vascular;  Laterality: Left;   AV FISTULA PLACEMENT     x2, unsuccessful.    BUBBLE STUDY  09/27/2021   Procedure: BUBBLE STUDY;  Surgeon: Lelon Perla, MD;  Location: [facility];  Service: Cardiovascular;;   IR FLUORO GUIDE CV LINE LEFT  12/14/2020   IR FLUORO GUIDE CV LINE LEFT  02/20/2021   IR FLUORO GUIDE CV LINE LEFT  09/11/2021   IR FLUORO GUIDE CV LINE LEFT  10/18/2021   IR FLUORO GUIDE CV LINE RIGHT  08/28/2020   IR FLUORO GUIDE CV LINE RIGHT  05/13/2021   IR FLUORO GUIDE CV LINE RIGHT  05/24/2021   IR PERC TUN PERIT CATH WO PORT S&I /IMAG  09/06/2020   IR PTA VENOUS EXCEPT DIALYSIS CIRCUIT  02/20/2021   IR PTA VENOUS EXCEPT DIALYSIS CIRCUIT  05/13/2021   IR REMOVAL TUN CV CATH W/O FL  05/24/2021   IR REMOVAL TUN CV CATH W/O FL  10/15/2021   IR US GUIDE VASC ACCESS LEFT  09/06/2020   IR US  GUIDE VASC ACCESS LEFT  09/11/2021   IR US GUIDE VASC ACCESS LEFT  10/18/2021   IR US GUIDE VASC ACCESS RIGHT  05/24/2021   IR US GUIDE VASC ACCESS RIGHT  09/10/2021   IR VENO/JUGULAR RIGHT  09/10/2021   IR VENOCAVAGRAM SVC  02/20/2021   TEE WITHOUT CARDIOVERSION N/A 09/27/2021   Procedure: TRANSESOPHAGEAL ECHOCARDIOGRAM (TEE);  Surgeon: Lelon Perla, MD;  Location: [facility] END[facility];  Service: Cardiovascular;  Laterality: N/A;   Family History  Problem Relation Age of Onset   Diabetes Mother    Diabetes Father    Social History:  reports that she has never smoked. She has never used smokeless tobacco. She reports that she does not drink alcohol and does not use drugs. Allergies  Allergen Reactions   Benzonatate Other (See Comments)    Hallucinations   Hydralazine Other (See Comments)    Hallucinations   Amoxicillin Itching and Nausea And Vomiting   Clonidine Other (See Comments)    Altered mental state, insomnia    Chlorhexidine Itching    Patient reports it is the CHG baths that cause her itching not the CHG used for caring for her IV/HD access.     Morphine Itching   Oxycodone Itching    Pt tolerated hospital admission 07/2021   Prior to Admission medications   Medication Sig Start Date End Date Taking? Authorizing Provider  acetaminophen (TYLENOL) 500 MG tablet Take 1,000 mg by mouth as needed for moderate pain.    [provider]  atorvastatin (LIPITOR) 10 MG tablet Take 1 tablet (10 mg total) by mouth daily. Patient taking differently: Take 10 mg by mouth at bedtime. 11/28/20   Roxan Hockey, MD  calcium carbonate (TUMS SMOOTHIES) 750 MG chewable tablet Chew 1,500 mg by mouth See admin instructions. Take 1500 mg with each meal and 1500 mg with each snack    [provider]  ceFEPIme 2 g in sodium chloride 0.9 % 100 mL Inject 2 g into the vein Every Tuesday,Thursday,and Saturday with dialysis for 17 days. 10/22/21 11/08/21  Dessa Phi, DO  diphenhydrAMINE  (BENADRYL) 25 MG tablet Take 25 mg by mouth 2 (two) times daily as needed for allergies.    [provider]  docusate sodium (COLACE) 100 MG capsule Take 100 mg by mouth 2 (two) times daily.    [provider]  doxycycline (VIBRAMYCIN) 100 MG capsule Take 100 mg by mouth 2 (two) times daily. Patient not taking: Reported on 09/10/2021 08/30/21   [provider]  gabapentin (NEURONTIN) 100 MG capsule Take 1 capsule (100 mg total) by mouth 2 (two) times daily. Patient taking differently: Take 100 mg by mouth See admin instructions. Take 100 mg by mouth twice a day on non dialysis days. On dialysis day take 100 mg in the morning, 200 mg after dialysis, and then 100 mg at bedtime. 08/28/21 09/27/21  Darliss Cheney, MD  loperamide (IMODIUM A-D) 2 MG tablet Take 4 mg by mouth as needed for diarrhea or loose stools.  [provider]  metroNIDAZOLE (FLAGYL) 500 MG tablet Take 1 tablet (500 mg total) by mouth every 12 (twelve) hours for 18 days. 10/21/21 11/08/21  Dessa Phi, DO  nitroGLYCERIN (NITROSTAT) 0.4 MG SL tablet Place 1 tablet (0.4 mg total) under the tongue every 5 (five) minutes as needed for chest pain. 05/24/21   [place] Donnamarie Poag, DO  oxyCODONE (OXY IR/ROXICODONE) 5 MG immediate release tablet Take 1-2 tablets (5-10 mg total) by mouth every 4 (four) hours as needed for moderate pain (pain score 4-6). 10/21/21   Dessa Phi, DO  pantoprazole (PROTONIX) 40 MG tablet Take 1 tablet (40 mg total) by mouth daily. 11/29/20   Roxan Hockey, MD  simethicone (MYLICON) 846 MG chewable tablet Chew 125 mg by mouth in the morning and at bedtime.    [provider]  tiZANidine (ZANAFLEX) 4 MG capsule Take 4 mg by mouth in the morning and at bedtime.    [provider]   No current facility-administered medications for this encounter.   Current Outpatient Medications  Medication Sig Dispense Refill   acetaminophen (TYLENOL) 500 MG tablet Take 1,000 mg by  mouth as needed for moderate pain.     atorvastatin (LIPITOR) 10 MG tablet Take 1 tablet (10 mg total) by mouth daily. (Patient taking differently: Take 10 mg by mouth at bedtime.) 30 tablet 5   calcium carbonate (TUMS SMOOTHIES) 750 MG chewable tablet Chew 1,500 mg by mouth See admin instructions. Take 1500 mg with each meal and 1500 mg with each snack     ceFEPIme 2 g in sodium chloride 0.9 % 100 mL Inject 2 g into the vein Every Tuesday,Thursday,and Saturday with dialysis for 17 days.     diphenhydrAMINE (BENADRYL) 25 MG tablet Take 25 mg by mouth 2 (two) times daily as needed for allergies.     docusate sodium (COLACE) 100 MG capsule Take 100 mg by mouth 2 (two) times daily.     doxycycline (VIBRAMYCIN) 100 MG capsule Take 100 mg by mouth 2 (two) times daily. (Patient not taking: Reported on 09/10/2021)     gabapentin (NEURONTIN) 100 MG capsule Take 1 capsule (100 mg total) by mouth 2 (two) times daily. (Patient taking differently: Take 100 mg by mouth See admin instructions. Take 100 mg by mouth twice a day on non dialysis days. On dialysis day take 100 mg in the morning, 200 mg after dialysis, and then 100 mg at bedtime.) 60 capsule 0   loperamide (IMODIUM A-D) 2 MG tablet Take 4 mg by mouth as needed for diarrhea or loose stools.     metroNIDAZOLE (FLAGYL) 500 MG tablet Take 1 tablet (500 mg total) by mouth every 12 (twelve) hours for 18 days. 36 tablet 0   nitroGLYCERIN (NITROSTAT) 0.4 MG SL tablet Place 1 tablet (0.4 mg total) under the tongue every 5 (five) minutes as needed for chest pain. 10 tablet 3   oxyCODONE (OXY IR/ROXICODONE) 5 MG immediate release tablet Take 1-2 tablets (5-10 mg total) by mouth every 4 (four) hours as needed for moderate pain (pain score 4-6). 30 tablet 0   pantoprazole (PROTONIX) 40 MG tablet Take 1 tablet (40 mg total) by mouth daily. 30 tablet 5   simethicone (MYLICON) 659 MG chewable tablet Chew 125 mg by mouth in the morning and at bedtime.     tiZANidine  (ZANAFLEX) 4 MG capsule Take 4 mg by mouth in the morning and at bedtime.     Labs: Basic Metabolic Panel: Recent Labs  Lab 10/27/21 0016  NA 127*  K 3.8  CL 94*  CO2 22  GLUCOSE 119*  BUN 32*  CREATININE 7.88*  CALCIUM 7.9*   Liver Function Tests: No results for input(s): "AST", "ALT", "ALKPHOS", "BILITOT", "PROT", "ALBUMIN" in the last 168 hours. No results for input(s): "LIPASE", "AMYLASE" in the last 168 hours. No results for input(s): "AMMONIA" in the last 168 hours. CBC: Recent Labs  Lab 10/27/21 0016 11/01/21 1336  WBC 14.0* 7.3  NEUTROABS 10.5* 5.7  HGB 9.6* 15.1*  HCT 30.8* 49.9*  MCV 93.9 94.7  PLT 219 152   Cardiac Enzymes: No results for input(s): "CKTOTAL", "CKMB", "CKMBINDEX", "TROPONINI" in the last 168 hours. CBG: No results for input(s): "GLUCAP" in the last 168 hours. Iron Studies: No results for input(s): "IRON", "TIBC", "TRANSFERRIN", "FERRITIN" in the last 72 hours. Studies/Results: No results found.  Physical Exam: Vitals:   11/01/21 1221 11/01/21 1222 11/01/21 1300 11/01/21 1330  BP:   (!) 181/81 (!) 183/53  Pulse:      Resp:  18    Temp: 98.3 F (36.8 C)     TempSrc: Oral     SpO2:         General: Awake, alert, very upset Lungs: CTA bilaterally. No wheeze, rales or rhonchi. Breathing is unlabored. Heart: RRR. No murmur, rubs or gallops.  Abdomen: obese, soft, nontender Lower extremities: R AKA, L AKA, L mid-finger amputated, no edema of stumps Neuro: AAOx3. Moves all extremities spontaneously. Dialysis Access: L IJ TDC  Dialysis Orders:  MWF - [facility] 4hrs, BFR 400, DFR 600,  EDW 146kg, 3K/ 2Ca Heparin 2500 units with HD Hectorol 78mcg IV qHD-last 10/30/21  Please note: patient is no longer a candidate for outpatient hemodialysis. Discussed with Renal Navigator. She will need placement to a facility where she can receive HD in the bed-more likely a LTAC.  Assessment/Plan: ESRD - on HD MWF. Plan for HD 7/29 in  AM 1st case. Hypertension/volume-BP up, resume home medications. HD tomorrow Anemia of CKD - Hgb 15.5. No Fe/ESA at this time. Secondary Hyperparathyroidism - Will resume Hectorol Nutrition - Renal diet with fluid restriction Dispo-Very difficulty situation. Patient required 4 staff members and 2 hoyer pads for transfer into our HD chair. Not staying on HD for full treatment d/t pain for multiple decubitus ulcers. This is a major safety concern for staff and patient in the outpatient center. Also concern for inability for ulcers to heal properly d/t frequent and rigorous transfers that are required. She will need admission to determine new placement at a facility where she can receive HD in the bed-more likley a LTAC.    Tobie Poet, NP [facility] 11/01/2021, 5:05 PM    Patient seen and examined, agree with above note with above modifications. Tobie Poet explains the situation very clearly -  we cannot put the staff of the kidney center at risk for these transfers and I dont think it is the best situation for the patient given her decubs.  She is upset and I understand-  we just cannot provide safe and fruitful OP HD for her.  The only option at this point would be an LTAC.  If her decubs are on the way to healing and she can build up her upper body strength and possibly with calorie control could get her weight down she may at some point be a candidate for OP HD -  planning on HD tomorrow off schedule  Corliss Parish, MD 11/01/2021

## 2021-11-01 NOTE — Care Management (Signed)
ED RNCM  received handoff, concerning patient  being discharge from HD center due to  safety issues involving transferring patient from stretcher to  chair for HD treatment. Patient is a bilateral AKA and MO.  HD Center is concerned for safety, they report not having the staffing to accommodate her transfer, and states they are equally concerned about her safety as well.  Went to room to meet with patient,  Dr  Moshe Cipro nephrologist in room speaking with patient, regarding finding a care facility to perform HD tx at the facility.  RNCM met with patient at bedside to discuss a possible plan,  who reported that the HD center refused to dialyzed patient  and has discharged the patient  and sent her to ED.     Dr. Louie Boston discussed plan LTAC for bedside Hemodialysis. EDP consulting with hospitalist for possible admission. Unit [facility] teal will follow patient for transitional icare planning

## 2021-11-01 NOTE — ED Notes (Signed)
IV team at bedside at this time. 

## 2021-11-01 NOTE — Telephone Encounter (Signed)
Left voicemail for patient regarding appointment for today. Requested call back to reschedule if she is not able to make it. Leatrice Jewels, RMA

## 2021-11-01 NOTE — ED Notes (Signed)
Patient reports that she has bed sores under her pannus and her coccyx

## 2021-11-01 NOTE — TOC CM/SW Note (Addendum)
RNCM received inbound call from [facility] with [facility] stating they are able to take this patient back. Moving forward [name] will use a geri-chair for patient's dialysis needs.   RNCM notified TOC SW, RNCM and renal navigator of update from [facility].  No additional needs from this RNCM.

## 2021-11-01 NOTE — TOC CM/SW Note (Signed)
RNCM received call from [facility] with [facility] advising their facility is unable to accept the patient back due the dialysis ctr on [address] in [place] can not accommodate the patient.    TOC will continue to follow

## 2021-11-01 NOTE — Progress Notes (Addendum)
Pharmacy Antibiotic Note  Courtney Gray is a 51 y.o. female for which pharmacy has been consulted for cefepime dosing for  wound infection .  Patient with a history of ESRD on HD TTS, recent bacteremia and hepatic abscess on long-term IV antibiotics, HF, PVD s/p bilateral AKA, DM, morbid obesity, chronic sacral wound stage II. Patient presenting with SOB.  On cefepime and flagyl outpatient (planned until 8/4).   WBC 7.3; T 98.3 F  Plan: Cefepime 2g QTThSa prior to admission. 1g dose given in the ER. Will continue pta dosing moving forward. --Will adjust schedule if HD schedule is changed  Metronidazole per MD Trend WBC, Fever, Renal function, & Clinical course F/u cultures, clinical course, WBC, fever F/u ID recommendations     Temp (24hrs), Avg:98.3 F (36.8 C), Min:98.3 F (36.8 C), Max:98.3 F (36.8 C)  Recent Labs  Lab 10/27/21 0016 11/01/21 1336  WBC 14.0* 7.3  CREATININE 7.88*  --     Estimated Creatinine Clearance: 12.2 mL/min (A) (by C-G formula based on SCr of 7.88 mg/dL (H)).    Allergies  Allergen Reactions   Benzonatate Other (See Comments)    Hallucinations   Hydralazine Other (See Comments)    Hallucinations   Amoxicillin Itching and Nausea And Vomiting   Clonidine Other (See Comments)    Altered mental state, insomnia    Chlorhexidine Itching    Patient reports it is the CHG baths that cause her itching not the CHG used for caring for her IV/HD access.     Morphine Itching   Oxycodone Itching    Pt tolerated hospital admission 07/2021    Antimicrobials this admission: cefepime 7/28 >>  metronidazole 7/28 >>  Microbiology results: Pending  Thank you for allowing pharmacy to be a part of this patient's care.  Lorelei Pont, PharmD, BCPS 11/01/2021 5:20 PM ED Clinical Pharmacist -  [phone]

## 2021-11-01 NOTE — H&P (Signed)
History and Physical    Courtney Gray PZW:[id number] DOB: October 10, 1970 DOA: 11/01/2021  PCP: Monico Blitz, MD (Confirm with patient/family/NH records and if not entered, this has to be entered at [facility] point of entry) Patient coming from: Home  I have personally briefly reviewed patient's old medical records in [place]  Chief Complaint: SOB  HPI: Courtney Gray is a 51 y.o. female with medical history significant of ESRD on HD TTS, recent bacteremia and hepatic abscess on long-term IV antibiotics, chronic HF PEF, PVD status post bilateral AKA, IIDM, morbid obesity, chronic sacral wound stage II, presented with increasing shortness of breath.  Patient underwent bilateral AKA recently including right AKA in May and left AKA on 7/5.  And patient was recently diagnosed with septic shock secondary to bacteremia and liver abscess, for which patient has been on cefepime and Flagyl and the last dose will be August 4.  Recently, since patient has bilateral AKA, she has had extreme difficulty to get access to outpatient HD center.  She never missed her dialysis however according to outpatient HD records, it takes at least 4 miles stop and [facility] lift for patient to be seated in the dialysis chair.  Starting today, patient started to feel shortness of breath, no chest pain no swelling.  No cough no fever or chills.  ED Course: Patient was found mild fluid overload, blood pressure significant elevated.  BUN 28, creatinine 5.7.  Chest x-ray showed mild pulmonary congestion.  Nephrology informed and plan for dialysis tonight.  Review of Systems: As per HPI otherwise 14 point review of systems negative.    Past Medical History:  Diagnosis Date   Arthritis    Diabetes mellitus without complication ([place])    Dialysis complication, subsequent encounter    Gout    High cholesterol    Hypertension    Renal disorder     Past Surgical History:  Procedure Laterality Date   ABDOMINAL AORTOGRAM W/LOWER  EXTREMITY N/A 08/07/2021   Procedure: ABDOMINAL AORTOGRAM W/LOWER EXTREMITY;  Surgeon: Broadus John, MD;  Location: [place] [facility];  Service: Cardiovascular;  Laterality: N/A;   AMPUTATION Right 08/14/2021   Procedure: AMPUTATION ABOVE KNEE;  Surgeon: Marty Heck, MD;  Location: [place];  Service: Vascular;  Laterality: Right;   AMPUTATION Left 09/23/2021   Procedure: AMPUTATION MIDDLE FINGER;  Surgeon: Sherilyn Cooter, MD;  Location: [place];  Service: Orthopedics;  Laterality: Left;   AMPUTATION Left 10/09/2021   Procedure: LEFT ABOVE KNEE AMPUTATION;  Surgeon: Cherre Robins, MD;  Location[facility] OR;  Service: Vascular;  Laterality: Left;   AV FISTULA PLACEMENT     x2, unsuccessful.    BUBBLE STUDY  09/27/2021   Procedure: BUBBLE STUDY;  Surgeon: Lelon Perla, MD;  Location: [facility] ENDOSCOPY;  Service: Cardiovascular;;   IR FLUORO GUIDE CV LINE LEFT  12/14/2020   IR FLUORO GUIDE CV LINE LEFT  02/20/2021   IR FLUORO GUIDE CV LINE LEFT  09/11/2021   IR FLUORO GUIDE CV LINE LEFT  10/18/2021   IR FLUORO GUIDE CV LINE RIGHT  08/28/2020   IR FLUORO GUIDE CV LINE RIGHT  05/13/2021   IR FLUORO GUIDE CV LINE RIGHT  05/24/2021   IR PERC TUN PERIT CATH WO PORT S&I /IMAG  09/06/2020   IR PTA VENOUS EXCEPT DIALYSIS CIRCUIT  02/20/2021   IR PTA VENOUS EXCEPT DIALYSIS CIRCUIT  05/13/2021   IR REMOVAL TUN CV CATH W/O FL  05/24/2021   IR REMOVAL TUN  CV CATH W/O FL  10/15/2021   IR US GUIDE VASC ACCESS LEFT  09/06/2020   IR US GUIDE VASC ACCESS LEFT  09/11/2021   IR US GUIDE VASC ACCESS LEFT  10/18/2021   IR US GUIDE VASC ACCESS RIGHT  05/24/2021   IR US GUIDE VASC ACCESS RIGHT  09/10/2021   IR VENO/JUGULAR RIGHT  09/10/2021   IR VENOCAVAGRAM SVC  02/20/2021   TEE WITHOUT CARDIOVERSION N/A 09/27/2021   Procedure: TRANSESOPHAGEAL ECHOCARDIOGRAM (TEE);  Surgeon: Lelon Perla, MD;  Location: [facility] ENDOSCOPY;  Service: Cardiovascular;  Laterality: N/A;     reports that she has never smoked. She has never used  smokeless tobacco. She reports that she does not drink alcohol and does not use drugs.  Allergies  Allergen Reactions   Benzonatate Other (See Comments)    Hallucinations   Hydralazine Other (See Comments)    Hallucinations   Amoxicillin Itching and Nausea And Vomiting   Clonidine Other (See Comments)    Altered mental state, insomnia    Chlorhexidine Itching    Patient reports it is the CHG baths that cause her itching not the CHG used for caring for her IV/HD access.     Morphine Itching   Oxycodone Itching    Pt tolerated hospital admission 07/2021    Family History  Problem Relation Age of Onset   Diabetes Mother    Diabetes Father      Prior to Admission medications   Medication Sig Start Date End Date Taking? Authorizing Provider  acetaminophen (TYLENOL) 500 MG tablet Take 1,000 mg by mouth as needed for moderate pain.    [provider]  atorvastatin (LIPITOR) 10 MG tablet Take 1 tablet (10 mg total) by mouth daily. Patient taking differently: Take 10 mg by mouth at bedtime. 11/28/20   Roxan Hockey, MD  calcium carbonate (TUMS SMOOTHIES) 750 MG chewable tablet Chew 1,500 mg by mouth See admin instructions. Take 1500 mg with each meal and 1500 mg with each snack    [provider]  ceFEPIme 2 g in sodium chloride 0.9 % 100 mL Inject 2 g into the vein Every Tuesday,Thursday,and Saturday with dialysis for 17 days. 10/22/21 11/08/21  Dessa Phi, DO  diphenhydrAMINE (BENADRYL) 25 MG tablet Take 25 mg by mouth 2 (two) times daily as needed for allergies.    [provider]  docusate sodium (COLACE) 100 MG capsule Take 100 mg by mouth 2 (two) times daily.    [provider]  doxycycline (VIBRAMYCIN) 100 MG capsule Take 100 mg by mouth 2 (two) times daily. Patient not taking: Reported on 09/10/2021 08/30/21   [provider]  gabapentin (NEURONTIN) 100 MG capsule Take 1 capsule (100 mg total) by mouth 2 (two) times daily. Patient taking  differently: Take 100 mg by mouth See admin instructions. Take 100 mg by mouth twice a day on non dialysis days. On dialysis day take 100 mg in the morning, 200 mg after dialysis, and then 100 mg at bedtime. 08/28/21 09/27/21  Darliss Cheney, MD  loperamide (IMODIUM A-D) 2 MG tablet Take 4 mg by mouth as needed for diarrhea or loose stools.    [provider]  metroNIDAZOLE (FLAGYL) 500 MG tablet Take 1 tablet (500 mg total) by mouth every 12 (twelve) hours for 18 days. 10/21/21 11/08/21  Dessa Phi, DO  nitroGLYCERIN (NITROSTAT) 0.4 MG SL tablet Place 1 tablet (0.4 mg total) under the tongue every 5 (five) minutes as needed for chest pain. 05/24/21  [place] Donnamarie Poag, DO  oxyCODONE (OXY IR/ROXICODONE) 5 MG immediate release tablet Take 1-2 tablets (5-10 mg total) by mouth every 4 (four) hours as needed for moderate pain (pain score 4-6). 10/21/21   Dessa Phi, DO  pantoprazole (PROTONIX) 40 MG tablet Take 1 tablet (40 mg total) by mouth daily. 11/29/20   Roxan Hockey, MD  simethicone (MYLICON) 128 MG chewable tablet Chew 125 mg by mouth in the morning and at bedtime.    [provider]  tiZANidine (ZANAFLEX) 4 MG capsule Take 4 mg by mouth in the morning and at bedtime.    [provider]    Physical Exam: Vitals:   11/01/21 1221 11/01/21 1222 11/01/21 1300 11/01/21 1330  BP:   (!) 181/81 (!) 183/53  Pulse:      Resp:  18    Temp: 98.3 F (36.8 C)     TempSrc: Oral     SpO2:        Constitutional: NAD, calm, comfortable Vitals:   11/01/21 1221 11/01/21 1222 11/01/21 1300 11/01/21 1330  BP:   (!) 181/81 (!) 183/53  Pulse:      Resp:  18    Temp: 98.3 F (36.8 C)     TempSrc: Oral     SpO2:       Eyes: PERRL, lids and conjunctivae normal ENMT: Mucous membranes are moist. Posterior pharynx clear of any exudate or lesions.Normal dentition.  Neck: normal, supple, no masses, no thyromegaly Respiratory: clear to auscultation bilaterally, no wheezing, fine  crackles on bilateral lower fields.  Increasing respiratory effort. No accessory muscle use.  Cardiovascular: Regular rate and rhythm, no murmurs / rubs / gallops. No extremity edema. 2+ pedal pulses. No carotid bruits.  Abdomen: no tenderness, no masses palpated. No hepatosplenomegaly. Bowel sounds positive.  Musculoskeletal: no clubbing / cyanosis. No joint deformity upper and lower extremities. Good ROM, no contractures. Normal muscle tone.  Skin: no rashes, lesions, ulcers. No induration.  Stage II pressure ulcer on bilateral sacral area Neurologic: CN 2-12 grossly intact. Sensation intact, DTR normal. Strength 5/5 in all 4.  Psychiatric: Normal judgment and insight. Alert and oriented x 3. Normal mood.     Labs on Admission: I have personally reviewed following labs and imaging studies  CBC: Recent Labs  Lab 10/27/21 0016 11/01/21 1336  WBC 14.0* 7.3  NEUTROABS 10.5* 5.7  HGB 9.6* 15.1*  HCT 30.8* 49.9*  MCV 93.9 94.7  PLT 219 786   Basic Metabolic Panel: Recent Labs  Lab 10/27/21 0016 11/01/21 1740  NA 127* 129*  K 3.8 5.0  CL 94* 93*  CO2 22 23  GLUCOSE 119* 140*  BUN 32* 28*  CREATININE 7.88* 5.71*  CALCIUM 7.9* 8.0*   GFR: Estimated Creatinine Clearance: 16.9 mL/min (A) (by C-G formula based on SCr of 5.71 mg/dL (H)). Liver Function Tests: Recent Labs  Lab 11/01/21 1740  AST 33  ALT 8  ALKPHOS 72  BILITOT 1.5*  PROT 6.6  ALBUMIN 2.2*   No results for input(s): "LIPASE", "AMYLASE" in the last 168 hours. No results for input(s): "AMMONIA" in the last 168 hours. Coagulation Profile: No results for input(s): "INR", "PROTIME" in the last 168 hours. Cardiac Enzymes: No results for input(s): "CKTOTAL", "CKMB", "CKMBINDEX", "TROPONINI" in the last 168 hours. BNP (last 3 results) No results for input(s): "PROBNP" in the last 8760 hours. HbA1C: No results for input(s): "HGBA1C" in the last 72 hours. CBG: No results for input(s): "GLUCAP" in the last 168  hours. Lipid Profile: No results for input(s): "CHOL", "HDL", "LDLCALC", "TRIG", "CHOLHDL", "LDLDIRECT" in the last 72 hours. Thyroid Function Tests: No results for input(s): "TSH", "T4TOTAL", "FREET4", "T3FREE", "THYROIDAB" in the last 72 hours. Anemia Panel: No results for input(s): "VITAMINB12", "FOLATE", "FERRITIN", "TIBC", "IRON", "RETICCTPCT" in the last 72 hours. Urine analysis: No results found for: "COLORURINE", "APPEARANCEUR", "LABSPEC", "PHURINE", "GLUCOSEU", "HGBUR", "BILIRUBINUR", "KETONESUR", "PROTEINUR", "UROBILINOGEN", "NITRITE", "LEUKOCYTESUR"  Radiological Exams on Admission: DG Chest 1 View  Result Date: 11/01/2021 CLINICAL DATA:  End-stage renal disease, vascular access issue EXAM: CHEST  1 VIEW COMPARISON:  10/26/2021 FINDINGS: Pulmonary vascular congestion without frank interstitial edema. Moderate left and possible small right pleural effusion. Lingular and left lower lobe atelectasis. Cardiomegaly. Left IJ dual lumen dialysis catheter terminating at the cavoatrial junction. IMPRESSION: Cardiomegaly with pulmonary vascular congestion. No frank interstitial edema. Moderate left and small right pleural effusions. Electronically Signed   By: Julian Hy M.D.   On: 11/01/2021 17:48    EKG: Ordered  Assessment/Plan Active Problems:   ESRD (end stage renal disease) on dialysis (HCC)   Acute pulmonary edema (HCC)   Fluid overload  (please populate well all problems here in Problem List. (For example, if patient is on BP meds at home and you resume or decide to hold them, it is a problem that needs to be her. Same for CAD, COPD, HLD and so on)   Acute on chronic HFpEF -Secondary to uncontrolled hypertension, clinical has signs of mild fluid overload.  Start Imdur, appears the patient has allergies to hydralazine, she is seeing acute CHF decompensation CCB rather contraindicated.  Her heart rate is low to start beta-blocker.  We will monitor response and titrate  Imdur. -Dialysis tonight versus tomorrow morning as per nephrology. -No significant hypoxemia or respiratory distress, hold off BiPAP.  HTN emergency -As above.  ESRD on HD -Discussed with ED physician and case manager, likely patient will need long-term dialysis plan as currently, increasingly, outpatient HD center high hard time accommodate patient's dialysis.  Likely disposition LTAC.  Recent bacteremia and liver abscess -Continue cefepime and Flagyl  IIDM -Monitor off insulin for now.  Morbid obesity -Calorie control recommended.  Stage II sacral wound -Chronic, does not appear to be infected, consult wound care.  DVT prophylaxis: Heparin subcu Code Status: Full code Family Communication: None at bedside Disposition Plan: Patient sick with CHF decompensation fluid overload, requiring inpatient dialysis, expect more than 2 midnight hospital stay. Consults called: Nephrology Admission status: Telemetry admission   Lequita Halt MD [facility] Pager [phone]  11/01/2021, 7:18 PM

## 2021-11-01 NOTE — Progress Notes (Signed)
CSW spoke with ED provider who stated patients health needs are too complex for her to receive dialysis outpatient. ED provider stated patient will need long term acute care. Patient would not be able to go to a LTAC from the ED. Patient would need to be admitted into the hospital to be considered for LTAC.

## 2021-11-01 NOTE — ED Notes (Signed)
Phlebotomy reports that they were unable to obtain labs x2 on the patient due to her difficult vasculature. Provider made aware.

## 2021-11-01 NOTE — Progress Notes (Signed)
CSW spoke with [facility] with [facility]. Raquel Sarna told CSW that patient would not be able to transfer to [facility] from the ED. Patient would have to be admitted and there would have to be some other acute health concerns going on for patients insurance company to approve LTAC.

## 2021-11-01 NOTE — ED Notes (Signed)
Position changed

## 2021-11-02 DIAGNOSIS — E669 Obesity, unspecified: Secondary | ICD-10-CM

## 2021-11-02 DIAGNOSIS — N186 End stage renal disease: Secondary | ICD-10-CM | POA: Diagnosis not present

## 2021-11-02 DIAGNOSIS — I5033 Acute on chronic diastolic (congestive) heart failure: Secondary | ICD-10-CM | POA: Diagnosis not present

## 2021-11-02 DIAGNOSIS — L89153 Pressure ulcer of sacral region, stage 3: Secondary | ICD-10-CM | POA: Diagnosis not present

## 2021-11-02 DIAGNOSIS — I132 Hypertensive heart and chronic kidney disease with heart failure and with stage 5 chronic kidney disease, or end stage renal disease: Secondary | ICD-10-CM | POA: Diagnosis not present

## 2021-11-02 DIAGNOSIS — Z1152 Encounter for screening for COVID-19: Secondary | ICD-10-CM | POA: Diagnosis not present

## 2021-11-02 LAB — BASIC METABOLIC PANEL
Anion gap: 9 (ref 5–15)
BUN: 30 mg/dL — ABNORMAL HIGH (ref 6–20)
CO2: 28 mmol/L (ref 22–32)
Calcium: 8.1 mg/dL — ABNORMAL LOW (ref 8.9–10.3)
Chloride: 94 mmol/L — ABNORMAL LOW (ref 98–111)
Creatinine, Ser: 6.19 mg/dL — ABNORMAL HIGH (ref 0.44–1.00)
GFR, Estimated: 8 mL/min — ABNORMAL LOW (ref >60)
Glucose, Bld: 180 mg/dL — ABNORMAL HIGH (ref 70–99)
Potassium: 3.6 mmol/L (ref 3.5–5.1)
Sodium: 131 mmol/L — ABNORMAL LOW (ref 135–145)

## 2021-11-02 LAB — CBC
HCT: 30.1 % — ABNORMAL LOW (ref 36.0–46.0)
Hemoglobin: 9.7 g/dL — ABNORMAL LOW (ref 12.0–15.0)
MCH: 29.9 pg (ref 26.0–34.0)
MCHC: 32.2 g/dL (ref 30.0–36.0)
MCV: 92.9 fL (ref 80.0–100.0)
Platelets: 261 10*3/uL (ref 150–400)
RBC: 3.24 MIL/uL — ABNORMAL LOW (ref 3.87–5.11)
RDW: 17.4 % — ABNORMAL HIGH (ref 11.5–15.5)
WBC: 14 10*3/uL — ABNORMAL HIGH (ref 4.0–10.5)
nRBC: 0 % (ref 0.0–0.2)

## 2021-11-02 LAB — HEPATITIS B SURFACE ANTIBODY,QUALITATIVE: Hep B S Ab: NONREACTIVE

## 2021-11-02 LAB — HEPATITIS B CORE ANTIBODY, TOTAL: Hep B Core Total Ab: NONREACTIVE

## 2021-11-02 LAB — GLUCOSE, CAPILLARY
Glucose-Capillary: 169 mg/dL — ABNORMAL HIGH (ref 70–99)
Glucose-Capillary: 186 mg/dL — ABNORMAL HIGH (ref 70–99)

## 2021-11-02 LAB — HEPATITIS B SURFACE ANTIGEN: Hepatitis B Surface Ag: NONREACTIVE

## 2021-11-02 LAB — HEPATITIS C ANTIBODY: HCV Ab: NONREACTIVE

## 2021-11-02 MED ORDER — ORAL CARE MOUTH RINSE: 15.0000 mL | OROMUCOSAL | Status: DC | PRN

## 2021-11-02 MED ORDER — HEPARIN SODIUM (PORCINE) 1000 UNIT/ML IJ SOLN
INTRAMUSCULAR | Status: AC
  Administered 2021-11-02: 1000 [IU]
  Filled 2021-11-02: qty 3

## 2021-11-02 MED ORDER — CEFEPIME HCL 2 G IV SOLR
2.0000 g | Freq: Once | INTRAVENOUS | Status: AC
  Administered 2021-11-02: 2 g via INTRAVENOUS
  Filled 2021-11-02: qty 12.5

## 2021-11-02 MED ORDER — SODIUM CHLORIDE 0.9 % IV SOLN: 2.0000 g | INTRAVENOUS | Status: DC

## 2021-11-02 MED ORDER — INSULIN ASPART 100 UNIT/ML IJ SOLN
0.0000 [IU] | Freq: Three times a day (TID) | INTRAMUSCULAR | Status: DC
  Administered 2021-11-02 – 2021-11-15 (×7): 1 [IU] via SUBCUTANEOUS
  Administered 2021-11-16: 2 [IU] via SUBCUTANEOUS
  Administered 2021-11-16 – 2021-11-23 (×4): 1 [IU] via SUBCUTANEOUS
  Administered 2021-11-25: 2 [IU] via SUBCUTANEOUS
  Administered 2021-11-28: 1 [IU] via SUBCUTANEOUS
  Administered 2021-11-29: 2 [IU] via SUBCUTANEOUS
  Administered 2021-12-03 – 2021-12-14 (×6): 1 [IU] via SUBCUTANEOUS

## 2021-11-02 MED ORDER — ZOLPIDEM TARTRATE 5 MG PO TABS
5.0000 mg | ORAL_TABLET | Freq: Every evening | ORAL | Status: AC | PRN
  Administered 2021-11-02: 5 mg via ORAL
  Filled 2021-11-02: qty 1

## 2021-11-02 MED ORDER — INSULIN ASPART 100 UNIT/ML IJ SOLN
0.0000 [IU] | Freq: Every day | INTRAMUSCULAR | Status: DC
  Administered 2021-11-18: 2 [IU] via SUBCUTANEOUS

## 2021-11-02 MED ORDER — ORAL CARE MOUTH RINSE
15.0000 mL | OROMUCOSAL | Status: DC
  Administered 2021-11-02 – 2021-11-22 (×28): 15 mL via OROMUCOSAL

## 2021-11-02 NOTE — Progress Notes (Signed)
Patient had a 5-beat Vtach episode at 1649 today.  Chiu MD notified and ordered EKG.  EKG completed and in chart.  Chiu MD notified of completion and results.

## 2021-11-02 NOTE — Progress Notes (Signed)
Subjective:  Seen sleeping soundly in HD- since was so upset yesterday did not wake   Objective Vital signs in last 24 hours: Vitals:   11/02/21 0917 11/02/21 0948 11/02/21 1000 11/02/21 1030  BP:  (!) 175/67 (!) 168/71 (!) 175/81  Pulse:  (!) 144 78 81  Resp:  17  15  Temp: 97.7 F (36.5 C)     TempSrc: Oral     SpO2:  94%  100%  Weight:       Weight change:   Intake/Output Summary (Last 24 hours) at 11/02/2021 1055 Last data filed at 11/02/2021 1001 Gross per 24 hour  Intake 540 ml  Output 0 ml  Net 540 ml   Dialysis Orders:  MWF - [place] 4hrs, BFR 400, DFR 600,  AVF, EDW 146kg, 3K/ 2Ca Heparin 2500 units with HD Hectorol 32mcg IV qHD-last 10/30/21   Assessment/Plan: ESRD - on HD MWF. HD today off schedule since admitted so late. Will resume MWF schedule on Monday Hypertension/volume- BP up, UF as able with HD-  not on any BP meds at home Anemia of CKD - Hgb 15.5. No Fe/ESA at this time. Secondary Hyperparathyroidism - Will resume Hectorol Nutrition - Renal diet with fluid restriction Dispo-Very difficulty situation. Patient required 4 staff members and 2 hoyer pads for transfer into HD chair as OP. Not staying on HD for full treatment d/t pain for multiple decubitus ulcers. This is a major safety concern for staff and patient in the outpatient center. Also concern for inability for ulcers to heal properly d/t frequent and rigorous transfers that are required. Need to determine new placement at a facility where she can receive HD in the bed-more likley a LTAC if possible.     Louis Meckel    Labs: Basic Metabolic Panel: Recent Labs  Lab 10/27/21 0016 11/01/21 1740 11/02/21 0302  NA 127* 129* 131*  K 3.8 5.0 3.6  CL 94* 93* 94*  CO2 22 23 28   GLUCOSE 119* 140* 180*  BUN 32* 28* 30*  CREATININE 7.88* 5.71* 6.19*  CALCIUM 7.9* 8.0* 8.1*   Liver Function Tests: Recent Labs  Lab 11/01/21 1740  AST 33  ALT 8  ALKPHOS 72  BILITOT 1.5*   PROT 6.6  ALBUMIN 2.2*   No results for input(s): "LIPASE", "AMYLASE" in the last 168 hours. No results for input(s): "AMMONIA" in the last 168 hours. CBC: Recent Labs  Lab 10/27/21 0016 11/01/21 1336 11/02/21 0302  WBC 14.0* 7.3 14.0*  NEUTROABS 10.5* 5.7  --   HGB 9.6* 15.1* 9.7*  HCT 30.8* 49.9* 30.1*  MCV 93.9 94.7 92.9  PLT 219 152 261   Cardiac Enzymes: No results for input(s): "CKTOTAL", "CKMB", "CKMBINDEX", "TROPONINI" in the last 168 hours. CBG: Recent Labs  Lab 11/01/21 2107  GLUCAP 159*    Iron Studies: No results for input(s): "IRON", "TIBC", "TRANSFERRIN", "FERRITIN" in the last 72 hours. Studies/Results: DG Chest 1 View  Result Date: 11/01/2021 CLINICAL DATA:  End-stage renal disease, vascular access issue EXAM: CHEST  1 VIEW COMPARISON:  10/26/2021 FINDINGS: Pulmonary vascular congestion without frank interstitial edema. Moderate left and possible small right pleural effusion. Lingular and left lower lobe atelectasis. Cardiomegaly. Left IJ dual lumen dialysis catheter terminating at the cavoatrial junction. IMPRESSION: Cardiomegaly with pulmonary vascular congestion. No frank interstitial edema. Moderate left and small right pleural effusions. Electronically Signed   By: Julian Hy M.D.   On: 11/01/2021 17:48   Medications: Infusions:  [START ON  11/04/2021] ceFEPime (MAXIPIME) IV     ceFEPime (MAXIPIME) IV      Scheduled Medications:  atorvastatin  10 mg Oral Daily   calcium carbonate  1,500 mg Oral With snacks   calcium carbonate  1,500 mg Oral TID WC   docusate sodium  100 mg Oral BID   gabapentin  100 mg Oral BID   heparin  5,000 Units Subcutaneous Q12H   isosorbide mononitrate  30 mg Oral Daily   metroNIDAZOLE  500 mg Oral Q12H   mouth rinse  15 mL Mouth Rinse 4 times per day   pantoprazole  40 mg Oral Daily   tiZANidine  4 mg Oral TID    have reviewed scheduled and prn medications.  Physical Exam: General:  sleeping soundly in  HD Heart: RRR Lungs: CBS bilat Abdomen: obese, non tender Extremities: bilat amps Dialysis Access: left upper arm AVF-  accessed for HD     11/02/2021,10:55 AM  LOS: 1 day

## 2021-11-02 NOTE — Hospital Course (Addendum)
51 year old F with PMH of ESRD on HD TTS, recent hospitalization for bacteremia, hepatic and splenic abscess on long-term IV antibiotics, diastolic CHF, PVD s/p bilateral AKA, IDDM-2, morbid obesity and chronic sacral wound presenting with progressive shortness of breath in the setting of missed dialysis and admitted for hypertensive emergency.

## 2021-11-02 NOTE — Progress Notes (Signed)
Patient was admitted to the unit with daughter at bedside. Upon assessment, patient has pressure injuries to the sacrum and posterior thigh. Dressings were changed. CHG bath was scheduled, however,patient informed me that she was allergic to the CHG wipes and wanted and a bath instead. Patient received her bath and gown changed.

## 2021-11-02 NOTE — Progress Notes (Signed)
  Progress Note   Patient: Courtney Gray LZJ:[id number] DOB: Mar 01, 1971 DOA: 11/01/2021     1 DOS: the patient was seen and examined on 11/02/2021   Brief hospital course: 51 y.o. female with medical history significant of ESRD on HD TTS, recent bacteremia and hepatic abscess on long-term IV antibiotics, chronic HF PEF, PVD status post bilateral AKA, IIDM, morbid obesity, chronic sacral wound stage II, presented with increasing shortness of breath.   Patient underwent bilateral AKA recently including right AKA in May and left AKA on 7/5.  And patient was recently diagnosed with septic shock secondary to bacteremia and liver abscess, for which patient has been on cefepime and Flagyl and the last dose will be August 4.  Recently, since patient has bilateral AKA, she has had extreme difficulty to get access to outpatient HD center.  She never missed her dialysis however according to outpatient HD records, it takes at least 4 miles stop and [facility] lift for patient to be seated in the dialysis chair.  Starting today, patient started to feel shortness of breath, no chest pain no swelling.  No cough no fever or chills  Assessment and Plan: Acute on chronic HFpEF -Secondary to uncontrolled hypertension, clinical has signs of mild fluid overload at presentation .Imdur was started at presentation Improved with HD overnight and this AM   HTN emergency -BP seems improved   ESRD on HD -Discussed with ED physician and case manager, likely patient will need long-term dialysis plan as currently, increasingly, outpatient HD center high hard time accommodate patient's dialysis.   -F/u with TOC   Recent bacteremia and liver abscess -Continue cefepime and Flagyl   IIDM -Will order SSI as needed   Morbid obesity -Recommend diet/lifestyle modification   Stage II sacral wound -Chronic, WOC consulted       Subjective: Seen on HD, without complaints  Physical Exam: Vitals:   11/02/21 1306 11/02/21 1339  11/02/21 1430 11/02/21 1528  BP: 139/85 (!) 156/73 (!) 160/82 (!) 126/103  Pulse:  69 97 82  Resp: 14 15 19 19   Temp:   98.6 F (37 C) 98.3 F (36.8 C)  TempSrc:    Oral  SpO2:  98%  100%  Weight:   (!) 149.3 kg    General exam: Awake, laying in bed, in nad, morbidly obese Respiratory system: Normal respiratory effort, no wheezing Cardiovascular system: regular rate, s1, s2 Gastrointestinal system: Soft, nondistended, positive BS Central nervous system: CN2-12 grossly intact, strength intact Extremities: Perfused, no clubbing Skin: Normal skin turgor, no notable skin lesions seen Psychiatry: Mood normal // no visual hallucinations   Data Reviewed:  Labs reviewed: Na 131, K 3.6, Hgb 9.7   Family Communication: Pt in room, family not at bedside  Disposition: Status is: Inpatient Remains inpatient appropriate because: Severity of illness  Planned Discharge Destination:  Unclear at this time    Author: Marylu Lund, MD 11/02/2021 3:55 PM  For on call review [email or web address].

## 2021-11-02 NOTE — Procedures (Signed)
Patient was seen on dialysis and the procedure was supervised.  BFR 400  Via AVF BP is  145/73.   Patient appears to be tolerating treatment well  Louis Meckel 11/02/2021

## 2021-11-03 DIAGNOSIS — L89312 Pressure ulcer of right buttock, stage 2: Secondary | ICD-10-CM | POA: Diagnosis not present

## 2021-11-03 DIAGNOSIS — E669 Obesity, unspecified: Secondary | ICD-10-CM | POA: Diagnosis not present

## 2021-11-03 DIAGNOSIS — N186 End stage renal disease: Secondary | ICD-10-CM | POA: Diagnosis not present

## 2021-11-03 LAB — GLUCOSE, CAPILLARY
Glucose-Capillary: 188 mg/dL — ABNORMAL HIGH (ref 70–99)
Glucose-Capillary: 177 mg/dL — ABNORMAL HIGH (ref 70–99)
Glucose-Capillary: 165 mg/dL — ABNORMAL HIGH (ref 70–99)
Glucose-Capillary: 125 mg/dL — ABNORMAL HIGH (ref 70–99)

## 2021-11-03 LAB — HEPATITIS B SURFACE ANTIBODY, QUANTITATIVE: Hep B S AB Quant (Post): 8.9 m[IU]/mL — ABNORMAL LOW (ref >9.9)

## 2021-11-03 MED ORDER — GERHARDT'S BUTT CREAM
TOPICAL_CREAM | Freq: Three times a day (TID) | CUTANEOUS | Status: AC
  Administered 2021-11-07 – 2021-11-12 (×3): 1 via TOPICAL
  Filled 2021-11-03 (×3): qty 1

## 2021-11-03 MED ORDER — CHLORHEXIDINE GLUCONATE CLOTH 2 % EX PADS
6.0000 | MEDICATED_PAD | Freq: Every day | CUTANEOUS | Status: DC
Start: 2021-11-03 — End: 2021-11-07
  Administered 2021-11-03: 6 via TOPICAL

## 2021-11-03 MED ORDER — SODIUM CHLORIDE 0.9 % IV SOLN
2.0000 g | INTRAVENOUS | Status: AC
  Administered 2021-11-04 – 2021-11-06 (×2): 2 g via INTRAVENOUS
  Filled 2021-11-03 (×3): qty 12.5

## 2021-11-03 MED ORDER — CALCIUM CARBONATE ANTACID 500 MG PO CHEW
1000.0000 mg | CHEWABLE_TABLET | Freq: Three times a day (TID) | ORAL | Status: DC
  Administered 2021-11-03 – 2021-11-04 (×3): 1000 mg via ORAL
  Filled 2021-11-03 (×8): qty 5

## 2021-11-03 NOTE — Consult Note (Signed)
WOC Nurse Consult Note: Reason for Consult:Moisture associated skin damage to bilateral buttocks and in gluteal cleft. Previously noted by by associate, K. Sanders last on 09/11/21. Now with continued skin injury as well as pressure plus shear, resulting in Stage 3 chronic skin injuries. MASD also to medial ad posterior thighs. Wound type: Pressure Pressure Injury POA: Yes Measurement:To be obtained today by Bedside RN with next dressing change. Measurements from last admission are the most recent on EHR. Wound IWL:NLGX, moist Drainage (amount, consistency, odor) small serous Periwound: with maceration Dressing procedure/placement/frequency: I will provide patient today with a mattress replacement with low air loss feature as the skin is exposed to urine and moisture near constantly. With the recent amputations, weight of body habitus is centered at buttocks (fulcrum).Turning and repositioning from side to side is in place and will continue with further notation to minimize time in the supine position. Gerhart's Butt Cream, a compounded 1:1:1 preparation of hydrocortisone cream, lotrimin cream and zinc oxide will be applied to the posterior and medial thighs and perineal area three times daily and PRN inadvertent removal. Topical care to the Stage 3 pressure injuries will be with a NS cleanse, gentle pat dry followed by covering the lesions with folded layers of xeroform gauze (antimicrobial nonadherent) and topping with silicone foam bordered dressings.  [facility] nursing team will not follow, but will remain available to this patient, the nursing and medical teams.  Please re-consult if needed.  Thank you for inviting [place] to participate in this patient's Plan of Care.  Maudie Flakes, MSN, RN, CNS, [place], Serita Grammes, [employer], [employer] phone:  [phone]

## 2021-11-03 NOTE — Progress Notes (Signed)
Subjective:  HD yesterday -  removed 3 liters-  tolerated fine -  sleeping again today -  family member at bedside -  much better on special bed  Objective Vital signs in last 24 hours: Vitals:   11/02/21 1528 11/02/21 2136 11/03/21 0554 11/03/21 0932  BP: (!) 126/103 (!) 119/49 (!) 148/52 (!) 155/76  Pulse: 82 71 72 70  Resp: 19 18 16 19   Temp: 98.3 F (36.8 C) (!) 97.5 F (36.4 C) 98.4 F (36.9 C) 98.3 F (36.8 C)  TempSrc: Oral     SpO2: 100% 97% 98% 99%  Weight:       Weight change: -2.8 kg  Intake/Output Summary (Last 24 hours) at 11/03/2021 1050 Last data filed at 11/03/2021 0554 Gross per 24 hour  Intake 483.71 ml  Output 6 ml  Net 477.71 ml   Dialysis Orders:  MWF - [facility] 4hrs, BFR 400, DFR 600,  AVF, EDW 146kg, 3K/ 2Ca Heparin 2500 units with HD Hectorol 54mcg IV qHD-last 10/30/21   Assessment/Plan: ESRD - on HD MWF. HD Sat off schedule since admitted so late. Will resume MWF schedule tomorrow Hypertension/volume- BP up, UF as able with HD-  not on any BP meds at home Anemia of CKD - Hgb 15.5. No Fe/ESA at this time.  15 now to 9-  not sure which is closer to accurate-  will check tomorrow with HD  Secondary Hyperparathyroidism - continue Hectorol-  check phos with HD tomorrow-  can just get labs with HD-  no binder on med list Nutrition - Renal diet with fluid restriction Dispo-Very difficulty situation. Patient required 4 staff members and 2 hoyer pads for transfer into HD chair as OP. Not staying on HD for full treatment d/t pain for multiple decubitus ulcers. This is a major safety concern for staff and patient in the outpatient center. Also concern for inability for ulcers to heal properly d/t frequent and rigorous transfers that are required. Need to determine new placement at a facility where she can receive HD in the bed-more likley a LTAC if possible.   Decubs-  abx and local wound care   Louis Meckel    Labs: Basic Metabolic  Panel: Recent Labs  Lab 11/01/21 1740 11/02/21 0302  NA 129* 131*  K 5.0 3.6  CL 93* 94*  CO2 23 28  GLUCOSE 140* 180*  BUN 28* 30*  CREATININE 5.71* 6.19*  CALCIUM 8.0* 8.1*   Liver Function Tests: Recent Labs  Lab 11/01/21 1740  AST 33  ALT 8  ALKPHOS 72  BILITOT 1.5*  PROT 6.6  ALBUMIN 2.2*   No results for input(s): "LIPASE", "AMYLASE" in the last 168 hours. No results for input(s): "AMMONIA" in the last 168 hours. CBC: Recent Labs  Lab 11/01/21 1336 11/02/21 0302  WBC 7.3 14.0*  NEUTROABS 5.7  --   HGB 15.1* 9.7*  HCT 49.9* 30.1*  MCV 94.7 92.9  PLT 152 261   Cardiac Enzymes: No results for input(s): "CKTOTAL", "CKMB", "CKMBINDEX", "TROPONINI" in the last 168 hours. CBG: Recent Labs  Lab 11/01/21 2107 11/02/21 1458 11/02/21 2137 11/03/21 0713  GLUCAP 159* 169* 186* 188*    Iron Studies: No results for input(s): "IRON", "TIBC", "TRANSFERRIN", "FERRITIN" in the last 72 hours. Studies/Results: DG Chest 1 View  Result Date: 11/01/2021 CLINICAL DATA:  End-stage renal disease, vascular access issue EXAM: CHEST  1 VIEW COMPARISON:  10/26/2021 FINDINGS: Pulmonary vascular congestion without frank interstitial edema. Moderate left and possible  small right pleural effusion. Lingular and left lower lobe atelectasis. Cardiomegaly. Left IJ dual lumen dialysis catheter terminating at the cavoatrial junction. IMPRESSION: Cardiomegaly with pulmonary vascular congestion. No frank interstitial edema. Moderate left and small right pleural effusions. Electronically Signed   By: Julian Hy M.D.   On: 11/01/2021 17:48   Medications: Infusions:  [START ON 11/04/2021] ceFEPime (MAXIPIME) IV      Scheduled Medications:  atorvastatin  10 mg Oral Daily   calcium carbonate  1,500 mg Oral TID WC   docusate sodium  100 mg Oral BID   gabapentin  100 mg Oral BID   heparin  5,000 Units Subcutaneous Q12H   insulin aspart  0-5 Units Subcutaneous QHS   insulin aspart  0-6  Units Subcutaneous TID WC   isosorbide mononitrate  30 mg Oral Daily   metroNIDAZOLE  500 mg Oral Q12H   mouth rinse  15 mL Mouth Rinse 4 times per day   pantoprazole  40 mg Oral Daily    have reviewed scheduled and prn medications.  Physical Exam: General:  sleeping soundly in HD Heart: RRR Lungs: CBS bilat Abdomen: obese, non tender Extremities: bilat amps Dialysis Access: upper arm AVF-  patent    11/03/2021,10:50 AM  LOS: 2 days

## 2021-11-03 NOTE — Progress Notes (Signed)
  Progress Note   Patient: Courtney Gray ZOX:[id number] DOB: 03-Jun-1970 DOA: 11/01/2021     2 DOS: the patient was seen and examined on 11/03/2021   Brief hospital course: 51 y.o. female with medical history significant of ESRD on HD TTS, recent bacteremia and hepatic abscess on long-term IV antibiotics, chronic HF PEF, PVD status post bilateral AKA, IIDM, morbid obesity, chronic sacral wound stage II, presented with increasing shortness of breath.   Patient underwent bilateral AKA recently including right AKA in May and left AKA on 7/5.  And patient was recently diagnosed with septic shock secondary to bacteremia and liver abscess, for which patient has been on cefepime and Flagyl and the last dose will be August 4.  Recently, since patient has bilateral AKA, she has had extreme difficulty to get access to outpatient HD center.  She never missed her dialysis however according to outpatient HD records, it takes at least 4 miles stop and [facility] lift for patient to be seated in the dialysis chair.  Starting today, patient started to feel shortness of breath, no chest pain no swelling.  No cough no fever or chills  Assessment and Plan: Acute on chronic HFpEF -Secondary to uncontrolled hypertension, clinical has signs of mild fluid overload at presentation .Imdur was started at presentation Improved with HD overnight and this AM   HTN emergency -BP seems improved   ESRD on HD -Discussed with ED physician and case manager, likely patient will need long-term dialysis plan as currently, increasingly, outpatient HD center high hard time accommodate patient's dialysis.   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, initially on anticoagulation which was later held as splenic infarct was determined to be infectious and not blood clot -Pt receiving IV cefepime on HD and flagyl, to end 8/4  PAD -Pt s/p B AKA for gangrene with plan to f/u with vascular surgery in 1 month -Pt was s/p L middle finger amputation for gangrene at recent admit      Subjective: Without complaints today  Physical Exam: Vitals:   11/02/21 1528 11/02/21 2136 11/03/21 0554 11/03/21 0932  BP: (!) 126/103 (!) 119/49 (!) 148/52 (!) 155/76  Pulse: 82 71 72 70  Resp: 19 18 16 19   Temp: 98.3 F (36.8 C) (!) 97.5 F (36.4 C) 98.4 F (36.9 C) 98.3 F (36.8 C)  TempSrc: Oral     SpO2: 100% 97% 98% 99%  Weight:       General exam: Conversant, in no acute distress Respiratory system: normal chest rise, clear, no audible wheezing Cardiovascular system: regular rhythm, s1-s2 Gastrointestinal system: Nondistended, nontender, pos BS Central nervous system: No seizures, no tremors Extremities: No cyanosis, B AKA Skin: No rashes, no pallor Psychiatry: Affect normal // no auditory hallucinations   Data Reviewed:  There are no new results to review at this time.  Family Communication: Pt in room, family at bedside  Disposition: Status is: Inpatient Remains inpatient appropriate because: Severity of illness  Planned Discharge Destination:  Unclear at this time    Author: Marylu Lund, MD 11/03/2021 4:38 PM  For on call review [email or web address].

## 2021-11-03 NOTE — Evaluation (Signed)
Physical Therapy Evaluation Patient Details Name: Courtney Gray MRN: [id number] DOB: 07-Jul-1970 Today's Date: 11/03/2021  History of Present Illness  Pt adm 7/28 with acute pulmonary edema. 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  Clinical Impression  Pt presents to PT with decr mobility after recent bil AKA's as well as sore on buttocks that will impact mobility. Pt known to me from prior hospitalization. Will work towards finding a wide w/c that pt can use to try and become more independent at w/c level. I think pt will need mechanical lift for quite some time. The sore on her buttocks will likely make a scooting/sliding board transfer unlikely at this time. Agree with recommendation for LTACH. Recommend pt be moved to a room with maxisky lift to make transfers easier.        Recommendations for follow up therapy are one component of a multi-disciplinary discharge planning process, led by the attending physician.  Recommendations may be updated based on patient status, additional functional criteria and insurance authorization.  Follow Up Recommendations PT at Long-term acute care hospital Can patient physically be transported by private vehicle: No    Assistance Recommended at Discharge Frequent or constant Supervision/Assistance  Patient can return home with the following  Two people to help with walking and/or transfers;Two people to help with bathing/dressing/bathroom;Assist for transportation    Equipment Recommendations Wheelchair (measurements PT);Wheelchair cushion (measurements PT);Other (comment);Hospital bed (Needs wide w/c with cushion)  Recommendations for Other Services       Functional Status Assessment Patient has had a recent decline in their functional status and demonstrates the ability to make significant improvements in function in a reasonable and predictable amount of time.      Precautions / Restrictions Precautions Precautions: Fall Precaution Comments: bil AKA, 3L O2 at baseline, LUQ drain Restrictions Weight Bearing Restrictions: Yes RLE Weight Bearing: Non weight bearing LLE Weight Bearing: Non weight bearing      Mobility  Bed Mobility Overal bed mobility: Needs Assistance Bed Mobility: Supine to Sit, Sit to Supine, Rolling Rolling: Min assist   Supine to sit: Min assist (from fully supine to long sitting) Sit to supine: Supervision   General bed mobility comments: Pt able to roll toward rt with min assist to bring hip over. Roll lt limited due to bed size. Pt requires assist to pull up into long sitting from fully supine. Pt able to go from partially reclined to long sitting modified independent.    Transfers                   General transfer comment: Will need mechanical lift    Ambulation/Gait                  Stairs            Wheelchair Mobility    Modified Rankin (Stroke Patients Only)       Balance Overall balance assessment: Needs assistance Sitting-balance support: No upper extremity supported Sitting balance-[name] Scale: Good Sitting balance - Comments: with long sitting in bed                                     Pertinent Vitals/Pain Pain Assessment Pain Assessment: Faces Faces Pain Scale: Hurts a little bit Pain Location: LLE Pain Descriptors / Indicators: Guarding, Grimacing Pain Intervention(s):  Monitored during session    Home Living Family/patient expects to be discharged to:: Unsure Living Arrangements: Children Available Help at Discharge: Family;Available PRN/intermittently Type of Home: Apartment Home Access: Ramped entrance Entrance Stairs-Rails: None Entrance Stairs-Number of Steps: 2   Home Layout: One level Home Equipment: Hand held shower head;Shower seat;Cane - single point;Grab bars - tub/shower;Rollator (4 wheels);Hospital bed      Prior Function  Prior Level of Function : Needs assist       Physical Assist : Mobility (physical);ADLs (physical) Mobility (physical): Bed mobility;Transfers   Mobility Comments: Using lift for OOB       Hand Dominance   Dominant Hand: Left    Extremity/Trunk Assessment   Upper Extremity Assessment Upper Extremity Assessment: Defer to OT evaluation    Lower Extremity Assessment Lower Extremity Assessment: RLE deficits/detail;LLE deficits/detail RLE Deficits / Details: AKA. Assist for movement against gravity LLE Deficits / Details: AKA. Assist for movement against gravity    Cervical / Trunk Assessment Cervical / Trunk Assessment: Other exceptions Cervical / Trunk Exceptions: increased body habitus  Communication   Communication: No difficulties  Cognition Arousal/Alertness: Awake/alert Behavior During Therapy: WFL for tasks assessed/performed Overall Cognitive Status: Within Functional Limits for tasks assessed                                          General Comments      Exercises     Assessment/Plan    PT Assessment Patient needs continued PT services  PT Problem List Decreased strength;Decreased mobility;Obesity;Decreased skin integrity;Decreased balance       PT Treatment Interventions DME instruction;Functional mobility training;Therapeutic exercise;Therapeutic activities;Balance training;Patient/family education;Wheelchair mobility training    PT Goals (Current goals can be found in the Care Plan section)  Acute Rehab PT Goals Patient Stated Goal: Pt wants to get up into a w/c PT Goal Formulation: With patient Time For Goal Achievement: 11/17/21 Potential to Achieve Goals: Fair    Frequency Min 2X/week     Co-evaluation PT/OT/SLP Co-Evaluation/Treatment: Yes Reason for Co-Treatment: Complexity of the patient's impairments (multi-system involvement);For patient/therapist safety PT goals addressed during session: Mobility/safety with  mobility OT goals addressed during session: ADL's and self-care;Strengthening/ROM       AM-PAC PT "6 Clicks" Mobility  Outcome Measure Help needed turning from your back to your side while in a flat bed without using bedrails?: A Little Help needed moving from lying on your back to sitting on the side of a flat bed without using bedrails?: Total Help needed moving to and from a bed to a chair (including a wheelchair)?: Total Help needed standing up from a chair using your arms (e.g., wheelchair or bedside chair)?: Total Help needed to walk in hospital room?: Total Help needed climbing 3-5 steps with a railing? : Total 6 Click Score: 8    End of Session Equipment Utilized During Treatment: Oxygen Activity Tolerance: Patient tolerated treatment well Patient left: in bed;with call bell/phone within reach Nurse Communication: Other (comment) (Requested to charge nurse pt be moved to a room with a maxisky lift when a room with one becomes available) PT Visit Diagnosis: Other abnormalities of gait and mobility (R26.89);Muscle weakness (generalized) (M62.81) Pain - Right/Left: Left Pain - part of body:  (residual limb)    Time: 3335-4562 PT Time Calculation (min) (ACUTE ONLY): 28 min   Charges:   PT Evaluation $PT Eval Moderate Complexity: 1 Mod  [place] [facility] [place] 11/03/2021, 11:30 AM

## 2021-11-03 NOTE — Evaluation (Signed)
Occupational Therapy Evaluation Patient Details Name: Courtney Gray MRN: [id number] DOB: 19-Nov-1970 Today's Date: 11/03/2021   History of Present Illness 51 y.o. female admitted 09/09/21 with severe LUQ abdominal pain, hypoglycemia. 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   Clinical Impression   PTA, pt using lift for mobility, pt currently needing min-max A for ADLs, min A for bed mobility. Pt able to assume long sitting position for seated grooming task for ~10 mins during session. Pt demo's good UB strength, however has decreased fine motor coordination. Pt presenting with impairments listed below, will follow acutely. Recommend OT at [facility] at d/c.     Recommendations for follow up therapy are one component of a multi-disciplinary discharge planning process, led by the attending physician.  Recommendations may be updated based on patient status, additional functional criteria and insurance authorization.   Follow Up Recommendations  OT at [facility]-term acute care hospital    Assistance Recommended at Discharge Intermittent Supervision/Assistance  Patient can return home with the following Two people to help with walking and/or transfers;Two people to help with bathing/dressing/bathroom;Assistance with cooking/housework;Direct supervision/assist for medications management;Assist for transportation;Help with stairs or ramp for entrance    Functional Status Assessment  Patient has had a recent decline in their functional status and demonstrates the ability to make significant improvements in function in a reasonable and predictable amount of time.  Equipment Recommendations  Other (comment) (defer)    Recommendations for Other Services PT consult     Precautions / Restrictions Precautions Precautions:  Fall Precaution Comments: bil AKA, 3L O2 at baseline, LUQ drain Restrictions Weight Bearing Restrictions: Yes RLE Weight Bearing: Non weight bearing LLE Weight Bearing: Non weight bearing      Mobility Bed Mobility Overal bed mobility: Needs Assistance Bed Mobility: Supine to Sit, Sit to Supine, Rolling Rolling: Min assist   Supine to sit: Min assist Sit to supine: Supervision   General bed mobility comments: Pt able to roll toward rt with min assist to bring hip over. Roll lt limited due to bed size. Pt requires assist to pull up into long sitting from fully supine. Pt able to go from partially reclined to long sitting modified independent.    Transfers                   General transfer comment: Will need mechanical lift      Balance Overall balance assessment: Needs assistance Sitting-balance support: No upper extremity supported Sitting balance-[name] Scale: Good Sitting balance - Comments: with long sitting in bed                                   ADL either performed or assessed with clinical judgement   ADL   Eating/Feeding: Minimal assistance;Bed level Eating/Feeding Details (indicate cue type and reason): assist to open containers Grooming: Minimal assistance;Bed level Grooming Details (indicate cue type and reason): light grooming sitting up in bed Upper Body Bathing: Moderate assistance;Sitting   Lower Body Bathing: Maximal assistance;Sitting/lateral leans;Sit to/from stand   Upper Body Dressing : Moderate assistance;Bed level   Lower Body Dressing: Maximal assistance;Sitting/lateral leans;Sit to/from Health and safety inspector Details (indicate cue type and reason): Bed level Toileting- Clothing Manipulation and Hygiene: Maximal assistance;Bed  level               Vision   Vision Assessment?: No apparent visual deficits     Perception     Praxis      Pertinent Vitals/Pain Pain Assessment Pain Assessment: Faces Pain  Score: 2  Pain Location: LLE Pain Descriptors / Indicators: Guarding, Grimacing Pain Intervention(s): Limited activity within patient's tolerance, Monitored during session, Repositioned     Hand Dominance Left   Extremity/Trunk Assessment Upper Extremity Assessment Upper Extremity Assessment: Generalized weakness (impaired fine motor coordination)   Lower Extremity Assessment Lower Extremity Assessment: Defer to PT evaluation RLE Deficits / Details: AKA. Assist for movement against gravity LLE Deficits / Details: AKA. Assist for movement against gravity   Cervical / Trunk Assessment Cervical / Trunk Assessment: Other exceptions Cervical / Trunk Exceptions: increased body habitus   Communication Communication Communication: No difficulties   Cognition Arousal/Alertness: Awake/alert Behavior During Therapy: WFL for tasks assessed/performed Overall Cognitive Status: Within Functional Limits for tasks assessed                                       General Comments  VSS on supplemental O2    Exercises     Shoulder Instructions      Home Living Family/patient expects to be discharged to:: Unsure Living Arrangements: Children Available Help at Discharge: Family;Available PRN/intermittently Type of Home: Apartment Home Access: Ramped entrance Entrance Stairs-Number of Steps: 2 Entrance Stairs-Rails: None Home Layout: One level     Bathroom Shower/Tub: Tub/shower unit;Curtain   Bathroom Toilet: Standard Bathroom Accessibility: No   Home Equipment: Hand held shower head;Shower seat;Cane - single point;Grab bars - tub/shower;Rollator (4 wheels);Hospital bed          Prior Functioning/Environment Prior Level of Function : Needs assist       Physical Assist : Mobility (physical);ADLs (physical) Mobility (physical): Bed mobility;Transfers   Mobility Comments: Using lift for OOB          OT Problem List: Decreased strength;Decreased range of  motion;Decreased activity tolerance;Impaired balance (sitting and/or standing);Decreased coordination;Decreased cognition;Decreased safety awareness;Decreased knowledge of use of DME or AE;Decreased knowledge of precautions;Obesity;Pain      OT Treatment/Interventions: Therapeutic exercise;Self-care/ADL training;Energy conservation;DME and/or AE instruction;Manual therapy;Therapeutic activities;Cognitive remediation/compensation;Patient/family education;Balance training    OT Goals(Current goals can be found in the care plan section) Acute Rehab OT Goals Patient Stated Goal: to get stronger/transfer to w/c OT Goal Formulation: With patient Time For Goal Achievement: 11/17/21 Potential to Achieve Goals: Fair ADL Goals Pt Will Perform Grooming: Independently;sitting;bed level Additional ADL Goal #1: Pt will perform bed mobility independently in prep for ADLs  OT Frequency: Min 2X/week    Co-evaluation PT/OT/SLP Co-Evaluation/Treatment: Yes Reason for Co-Treatment: Complexity of the patient's impairments (multi-system involvement);To address functional/ADL transfers;For patient/therapist safety PT goals addressed during session: Mobility/safety with mobility OT goals addressed during session: ADL's and self-care;Proper use of Adaptive equipment and DME      AM-PAC OT "6 Clicks" Daily Activity     Outcome Measure Help from another person eating meals?: A Little Help from another person taking care of personal grooming?: A Little Help from another person toileting, which includes using toliet, bedpan, or urinal?: Total Help from another person bathing (including washing, rinsing, drying)?: A Lot Help from another person to put on and taking off regular upper body clothing?: A Little Help from another person to put on and  taking off regular lower body clothing?: A Lot 6 Click Score: 14   End of Session Equipment Utilized During Treatment: Oxygen Nurse Communication: Mobility  status  Activity Tolerance: Patient tolerated treatment well Patient left: in bed;with call bell/phone within reach  OT Visit Diagnosis: Muscle weakness (generalized) (M62.81);History of falling (Z91.81)                Time: 4276-7011 OT Time Calculation (min): 28 min Charges:  OT General Charges $OT Visit: 1 Visit OT Evaluation $OT Eval Moderate Complexity: 1 [address] OTD, OTR/L Acute Rehab [phone]) 832 - [place] 11/03/2021, 12:55 PM

## 2021-11-04 DIAGNOSIS — L89312 Pressure ulcer of right buttock, stage 2: Secondary | ICD-10-CM | POA: Diagnosis not present

## 2021-11-04 DIAGNOSIS — N186 End stage renal disease: Secondary | ICD-10-CM | POA: Diagnosis not present

## 2021-11-04 DIAGNOSIS — E669 Obesity, unspecified: Secondary | ICD-10-CM | POA: Diagnosis not present

## 2021-11-04 LAB — GLUCOSE, CAPILLARY
Glucose-Capillary: 100 mg/dL — ABNORMAL HIGH (ref 70–99)
Glucose-Capillary: 96 mg/dL (ref 70–99)
Glucose-Capillary: 127 mg/dL — ABNORMAL HIGH (ref 70–99)

## 2021-11-04 LAB — BASIC METABOLIC PANEL
Anion gap: 10 (ref 5–15)
BUN: 24 mg/dL — ABNORMAL HIGH (ref 6–20)
CO2: 25 mmol/L (ref 22–32)
Calcium: 8.5 mg/dL — ABNORMAL LOW (ref 8.9–10.3)
Chloride: 99 mmol/L (ref 98–111)
Creatinine, Ser: 5.56 mg/dL — ABNORMAL HIGH (ref 0.44–1.00)
GFR, Estimated: 9 mL/min — ABNORMAL LOW (ref >60)
Glucose, Bld: 109 mg/dL — ABNORMAL HIGH (ref 70–99)
Potassium: 3.5 mmol/L (ref 3.5–5.1)
Sodium: 134 mmol/L — ABNORMAL LOW (ref 135–145)

## 2021-11-04 LAB — MAGNESIUM: Magnesium: 1.8 mg/dL (ref 1.7–2.4)

## 2021-11-04 LAB — PHOSPHORUS: Phosphorus: 2.8 mg/dL (ref 2.5–4.6)

## 2021-11-04 MED ORDER — DILTIAZEM HCL 25 MG/5ML IV SOLN
15.0000 mg | Freq: Once | INTRAVENOUS | Status: AC
  Administered 2021-11-04: 15 mg via INTRAVENOUS
  Filled 2021-11-04 (×2): qty 5

## 2021-11-04 MED ORDER — HYDROMORPHONE HCL 1 MG/ML IJ SOLN
0.5000 mg | INTRAMUSCULAR | Status: DC | PRN
  Administered 2021-11-04: 0.5 mg via INTRAVENOUS
  Filled 2021-11-04 (×2): qty 1

## 2021-11-04 MED ORDER — ALTEPLASE 2 MG IJ SOLR
2.0000 mg | Freq: Once | INTRAMUSCULAR | Status: AC | PRN
  Administered 2021-11-04: 2 mg
  Filled 2021-11-04: qty 2

## 2021-11-04 MED ORDER — METOPROLOL TARTRATE 25 MG PO TABS
25.0000 mg | ORAL_TABLET | Freq: Two times a day (BID) | ORAL | Status: DC
  Administered 2021-11-04 – 2021-11-20 (×26): 25 mg via ORAL
  Filled 2021-11-04 (×31): qty 1

## 2021-11-04 MED ORDER — HYDROMORPHONE HCL 1 MG/ML IJ SOLN
1.0000 mg | Freq: Once | INTRAMUSCULAR | Status: AC | PRN
  Administered 2021-11-04: 1 mg via INTRAVENOUS
  Filled 2021-11-04: qty 1

## 2021-11-04 MED ORDER — ANTICOAGULANT SODIUM CITRATE 4% (200MG/5ML) IV SOLN: 5.0000 mL | Status: DC | PRN

## 2021-11-04 MED ORDER — HEPARIN SODIUM (PORCINE) 1000 UNIT/ML DIALYSIS: 20.0000 [IU]/kg | INTRAMUSCULAR | Status: DC | PRN

## 2021-11-04 MED ORDER — LABETALOL HCL 5 MG/ML IV SOLN
5.0000 mg | INTRAVENOUS | Status: DC | PRN
  Administered 2021-11-04: 5 mg via INTRAVENOUS
  Filled 2021-11-04 (×2): qty 4

## 2021-11-04 MED ORDER — HEPARIN SODIUM (PORCINE) 1000 UNIT/ML DIALYSIS: 1000.0000 [IU] | INTRAMUSCULAR | Status: DC | PRN

## 2021-11-04 NOTE — Progress Notes (Addendum)
[facility] Progress Note   Subjective:   Patient seen and examined at bedside in dialysis.  Tolerating treatment well so far.  Reports pain in wounds on buttocks and swelling in L hand.  No other specific complaints.  Denies CP, SOB and n/v/d.  Per PCT issue with pushing TDC today, but pulled fine.    Objective Vitals:   11/04/21 0422 11/04/21 0606 11/04/21 0903 11/04/21 1117  BP: (!) 165/80 (!) 162/92 (!) 183/85   Pulse: 87 91 95   Resp: 19 19 16    Temp: 98.4 F (36.9 C) 98.4 F (36.9 C) 98.8 F (37.1 C) 98.2 F (36.8 C)  TempSrc: Oral Oral Oral Oral  SpO2: 96% 99% 99% 96%  Weight:    (!) 177 kg   Physical Exam General:chronically ill appearing, obese female in NAD Heart:RRR, no mrg Lungs:BS decreased, mostly CTAB, nml WOB on 4L via [place] Abdomen:soft, NTND Extremities:LUE non pitting edema, b/l AKA Dialysis Access: TDC in use   Filed Weights   11/02/21 0236 11/02/21 1430 11/04/21 1117  Weight: (!) 152.1 kg (!) 149.3 kg (!) 177 kg    Intake/Output Summary (Last 24 hours) at 11/04/2021 1124 Last data filed at 11/04/2021 0900 Gross per 24 hour  Intake 1800 ml  Output 25 ml  Net 1775 ml    Additional Objective Labs: Basic Metabolic Panel: Recent Labs  Lab 11/01/21 1740 11/02/21 0302 11/04/21 0250  NA 129* 131* 134*  K 5.0 3.6 3.5  CL 93* 94* 99  CO2 23 28 25   GLUCOSE 140* 180* 109*  BUN 28* 30* 24*  CREATININE 5.71* 6.19* 5.56*  CALCIUM 8.0* 8.1* 8.5*   Liver Function Tests: Recent Labs  Lab 11/01/21 1740  AST 33  ALT 8  ALKPHOS 72  BILITOT 1.5*  PROT 6.6  ALBUMIN 2.2*    CBC: Recent Labs  Lab 11/01/21 1336 11/02/21 0302  WBC 7.3 14.0*  NEUTROABS 5.7  --   HGB 15.1* 9.7*  HCT 49.9* 30.1*  MCV 94.7 92.9  PLT 152 261   CBG: Recent Labs  Lab 11/03/21 0713 11/03/21 1128 11/03/21 1639 11/03/21 2126 11/04/21 0712  GLUCAP 188* 177* 165* 125* 100*    Medications:  ceFEPime (MAXIPIME) IV      atorvastatin  10 mg Oral  Daily   calcium carbonate  1,000 mg Oral TID WC   Chlorhexidine Gluconate Cloth  6 each Topical Q0600   docusate sodium  100 mg Oral BID   gabapentin  100 mg Oral BID   Gerhardt's butt cream   Topical TID   heparin  5,000 Units Subcutaneous Q12H   insulin aspart  0-5 Units Subcutaneous QHS   insulin aspart  0-6 Units Subcutaneous TID WC   isosorbide mononitrate  30 mg Oral Daily   metoprolol tartrate  25 mg Oral BID   metroNIDAZOLE  500 mg Oral Q12H   mouth rinse  15 mL Mouth Rinse 4 times per day   pantoprazole  40 mg Oral Daily    DialysMWF - [facility] 4hrs, BFR 400, DFR 600,  AVF, EDW 146kg, 3K/ 2Ca Heparin 2500 units with HD Hectorol 84mcg IV qHD-last 10/30/21     Assessment/Plan: ESRD - on HD MWF. HD today per regular schedule.  Hypertension/volume- BP variable, UF as able with HD.  Metoprolol 25mg  BID started today.  Does not appear grossly volume overloaded, not close to EDW per bed weights.  Anemia of CKD - Hgb 9.7. No Fe/ESA at this time.  If continues to be <10 will start ESA.  Secondary Hyperparathyroidism - Ca in goal. continue Hectorol-  check phos . Not on binders.  Nutrition - Renal diet with fluid restriction Dispo-Very difficulty situation. Patient required 4 staff members and 2 hoyer pads for transfer into HD chair as OP. Not staying on HD for full treatment d/t pain for multiple decubitus ulcers. This is a major safety concern for staff and patient in the outpatient center. Also concern for inability for ulcers to heal properly d/t frequent and rigorous transfers that are required. Need to determine new placement at a facility where she can receive HD in the bed-more likley a LTAC if possible.   Decubs-  abx and local wound care Recent septic shock 2/2 bacteremia/liver abscess - on IV cefepime w/HD until 11/08/21  Jen Mow, PA-C [facility] 11/04/2021,11:24 AM  LOS: 3 days   Nephrology attending: The patient was seen and  examined in dialysis unit.  Chart reviewed and I agree with above. ESRD on HD, receiving dialysis today.  Volume status looks acceptable.  Difficult discharge condition as above.  Katheran James, [facility].

## 2021-11-04 NOTE — Progress Notes (Signed)
Pharmacy Antibiotic Note- follow-up  Courtney Gray is a 51 y.o. female for which pharmacy has been consulted for cefepime dosing for  wound infection .  Patient with a history of ESRD on HD TTS, recent bacteremia and hepatic abscess on long-term IV antibiotics, HF, PVD s/p bilateral AKA, DM, morbid obesity, chronic sacral wound stage II. Patient presenting with SOB.  On cefepime and flagyl outpatient (planned until 8/4).   WBC 7.3; T 98.3 F  Plan: Cefepime 2g QMWF s/p HD  Metronidazole per MD (500 mg po q12h)  Trend WBC, Fever, Renal function, & Clinical course F/u cultures, clinical course, WBC, fever F/u ID recommendations  Weight: (!) 149.3 kg (329 lb 2.4 oz)  Temp (24hrs), Avg:98.4 F (36.9 C), Min:97.9 F (36.6 C), Max:98.8 F (37.1 C)  Recent Labs  Lab 11/01/21 1336 11/01/21 1740 11/02/21 0302 11/04/21 0250  WBC 7.3  --  14.0*  --   CREATININE  --  5.71* 6.19* 5.56*     Estimated Creatinine Clearance: 17.7 mL/min (A) (by C-G formula based on SCr of 5.56 mg/dL (H)).    Allergies  Allergen Reactions   Benzonatate Other (See Comments)    Hallucinations   Hydralazine Other (See Comments)    Hallucinations   Amoxicillin Itching and Nausea And Vomiting   Clonidine Other (See Comments)    Altered mental state, insomnia    Chlorhexidine Itching    Patient reports it is the CHG baths that cause her itching not the CHG used for caring for her IV/HD access.     Morphine Itching   Oxycodone Itching    Pt tolerated hospital admission 07/2021    Antimicrobials this admission: cefepime 7/28 >> 8/4 metronidazole 7/28 >> 8/4    Thank you for allowing pharmacy to be a part of this patient's care.  Vaughan Basta BS, PharmD, BCPS Clinical Pharmacist 11/04/2021 9:16 AM  Contact: [phone] after 3 PM  "Be curious, not judgmental..." -Jamal Maes

## 2021-11-04 NOTE — Progress Notes (Signed)
   11/04/21 0520  Provider Notification  Provider Name/Title Dr. Mitzi Hansen  Date Provider Notified 11/04/21  Time Provider Notified [phone]  Method of Notification Page  Notification Reason Change in status  Provider response See new orders  Date of Provider Response 11/04/21   During the night, the patient had multiple runs of SVT - HR as high as in the 150s.  For the most part, the patient was asymptomatic.  At least 3 separate occassions, she did feel like her heart was racing.  VS stayed stable except for BP being slightly elevated.  She continues to c/o severe sacral pain due to her pressure wound.  Oxycodone 10 mg given @ 3559 with no relief.  She frequently refused turns to relieve pressure.  Dr. Myna Hidalgo made aware of the above.  IV Dilaudid given x 2 with minimum relief.  IV Cardizem 15 mg given at 0608.  Rapid Response RN made aware of situation.  Will continue to monitor patient.  Earleen Reaper RN

## 2021-11-04 NOTE — Progress Notes (Signed)
  Progress Note   Patient: Courtney Gray LOV:[id number] DOB: 09-Apr-1970 DOA: 11/01/2021     3 DOS: the patient was seen and examined on 11/04/2021   Brief hospital course: 51 y.o. female with medical history significant of ESRD on HD TTS, recent bacteremia and hepatic abscess on long-term IV antibiotics, chronic HF PEF, PVD status post bilateral AKA, IIDM, morbid obesity, chronic sacral wound stage II, presented with increasing shortness of breath.   Patient underwent bilateral AKA recently including right AKA in May and left AKA on 7/5.  And patient was recently diagnosed with septic shock secondary to bacteremia and liver abscess, for which patient has been on cefepime and Flagyl and the last dose will be August 4.  Recently, since patient has bilateral AKA, she has had extreme difficulty to get access to outpatient HD center.  She never missed her dialysis however according to outpatient HD records, it takes at least 4 miles stop and [facility] lift for patient to be seated in the dialysis chair.  Starting today, patient started to feel shortness of breath, no chest pain no swelling.  No cough no fever or chills  Assessment and Plan: Acute on chronic HFpEF -Secondary to uncontrolled hypertension, clinical has signs of mild fluid overload at presentation .Imdur was started at presentation Improved with HD this visit   HTN emergency -BP seems improved   ESRD on HD -Discussed with ED physician and case manager, likely patient will need long-term dialysis plan as currently, increasingly, outpatient HD center high hard time accommodate patient's dialysis.   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 On recent admit, developed septic shock from bacteroides  thetaiotaomicron bacteremia with septic emboli to spleen, initially on anticoagulation which was later held as splenic infarct was determined to be infectious and not blood clot -Pt receiving IV cefepime on HD and flagyl, to end 8/4  PAD -Pt s/p B AKA for gangrene with plan to f/u with vascular surgery in 1 month -Pt was s/p L middle finger amputation for gangrene at recent admit      Subjective: Complaining of lower back pain, seen on HD this AM  Physical Exam: Vitals:   11/04/21 1430 11/04/21 1500 11/04/21 1530 11/04/21 1540  BP: 111/73 125/85 (!) 67/51 (!) 167/68  Pulse: 85 84 86 92  Resp: (!) 22 19 15 20   Temp:    98.4 F (36.9 C)  TempSrc:    Oral  SpO2: 97% 94% 98% 100%  Weight:    (!) 171 kg   General exam: Awake, laying in bed, in nad Respiratory system: Normal respiratory effort, no wheezing Cardiovascular system: regular rate, s1, s2 Gastrointestinal system: Soft, nondistended, positive BS Central nervous system: CN2-12 grossly intact, strength intact Extremities: Perfused, no clubbing Skin: Normal skin turgor, no notable skin lesions seen Psychiatry: Mood normal // no visual hallucinations   Data Reviewed:  Labs reviewed: K 3.5  Family Communication: Pt in room, family not at bedside  Disposition: Status is: Inpatient Remains inpatient appropriate because: Severity of illness  Planned Discharge Destination:  Unclear at this time    Author: Marylu Lund, MD 11/04/2021 4:28 PM  For on call review [email or web address].

## 2021-11-04 NOTE — Care Management Important Message (Signed)
Important Message  Patient Details  Name: Courtney Gray MRN: [id number] Date of Birth: 1970/07/31   Medicare Important Message Given:  Yes     Cleland Simkins [address] 11/04/2021, 2:53 PM

## 2021-11-04 NOTE — Progress Notes (Signed)
Received patient in bed to unit.  Alert and oriented.  Informed consent signed and in  chart.   Treatment initiated: 1138 Treatment completed: 1540  Patient tolerated well.  Transported back to the room  alert, without acute distress.  Hand-off given to patient's nurse.   Access used: HD cath Access issues: Venous would not pull, HD cath TPA'd at end of tx  Total UF removed: 2500 Medication(s) given: TPA, Oxycodone 5mg  Post HD VS: 98.4 167/68 92 20 100% 4LNC Post HD weight: 171kg   [facility]

## 2021-11-05 DIAGNOSIS — E669 Obesity, unspecified: Secondary | ICD-10-CM | POA: Diagnosis not present

## 2021-11-05 DIAGNOSIS — N186 End stage renal disease: Secondary | ICD-10-CM | POA: Diagnosis not present

## 2021-11-05 LAB — GLUCOSE, CAPILLARY
Glucose-Capillary: 99 mg/dL (ref 70–99)
Glucose-Capillary: 159 mg/dL — ABNORMAL HIGH (ref 70–99)
Glucose-Capillary: 145 mg/dL — ABNORMAL HIGH (ref 70–99)
Glucose-Capillary: 118 mg/dL — ABNORMAL HIGH (ref 70–99)

## 2021-11-05 MED ORDER — ONDANSETRON 4 MG PO TBDP
4.0000 mg | ORAL_TABLET | Freq: Three times a day (TID) | ORAL | Status: DC | PRN
Start: 2021-11-05 — End: 2021-11-28
  Administered 2021-11-05 – 2021-11-08 (×4): 4 mg via ORAL
  Filled 2021-11-05 (×4): qty 1

## 2021-11-05 MED ORDER — POLYETHYLENE GLYCOL 3350 17 G PO PACK
17.0000 g | PACK | Freq: Every day | ORAL | Status: DC
  Administered 2021-11-05 – 2021-11-12 (×6): 17 g via ORAL
  Filled 2021-11-05 (×8): qty 1

## 2021-11-05 NOTE — Progress Notes (Signed)
Progress Note   Patient: Courtney Gray RKY:[id number] DOB: Feb 23, 1971 DOA: 11/01/2021     4 DOS: the patient was seen and examined on 11/05/2021   Brief hospital course: 51 y.o. female with medical history significant of ESRD on HD TTS, recent bacteremia and hepatic abscess on long-term IV antibiotics, chronic HF PEF, PVD status post bilateral AKA, IIDM, morbid obesity, chronic sacral wound stage II, presented with increasing shortness of breath.   Patient underwent bilateral AKA recently including right AKA in May and left AKA on 7/5.  And patient was recently diagnosed with septic shock secondary to bacteremia and liver abscess, for which patient has been on cefepime and Flagyl and the last dose will be August 4.  Recently, since patient has bilateral AKA, she has had extreme difficulty to get access to outpatient HD center.  She never missed her dialysis however according to outpatient HD records, it takes at least 4 miles stop and [facility] lift for patient to be seated in the dialysis chair.  Starting today, patient started to feel shortness of breath, no chest pain no swelling.  No cough no fever or chills  Assessment and Plan: Acute on chronic HFpEF -Secondary to uncontrolled hypertension, clinical has signs of mild fluid overload at presentation .Imdur was started at presentation Volume status improved with HD   HTN emergency -BP seems improved   ESRD on HD -Admitting physician discussed with ED physician and case manager, likely patient will need long-term dialysis plan as currently, increasingly, outpatient HD center high hard time accommodate patient's dialysis.   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 On recent admit, developed septic shock from bacteroides thetaiotaomicron bacteremia with septic emboli to spleen, initially on anticoagulation which was later held as splenic infarct was determined to be infectious and not blood clot -Pt receiving IV cefepime on HD and flagyl, to end 8/4  PAD -Pt s/p B AKA for gangrene with plan to f/u with vascular surgery in 1 month -Pt was s/p L middle finger amputation for gangrene at recent admit      Subjective: Feels tired this AM  Physical Exam: Vitals:   11/05/21 0324 11/05/21 0507 11/05/21 0738 11/05/21 1136  BP:  (!) 164/116 (!) 139/90 (!) 151/85  Pulse:  90 80 70  Resp:  18 (!) 22 (!) 22  Temp:  98.6 F (37 C) 98.4 F (36.9 C) 98.6 F (37 C)  TempSrc:  Oral Oral Oral  SpO2:  99% 99% 100%  Weight:      Height: 5\' 5"  (1.651 m)      General exam: Conversant, in no acute distress Respiratory system: normal chest rise, clear, no audible wheezing Cardiovascular system: regular rhythm, s1-s2 Gastrointestinal system: Nondistended, nontender, pos BS Central nervous system: No seizures, no tremors Extremities: No cyanosis, no joint deformities Skin: No rashes, no pallor Psychiatry: Affect normal // no auditory hallucinations   Data Reviewed:  There are no new results to review at this time.  Family Communication: Pt in room, family not at bedside  Disposition: Status is: Inpatient Remains inpatient appropriate because: Severity of illness  Planned Discharge Destination:  Unclear at this time    Author: Marylu Lund, MD 11/05/2021 4:20 PM  For on call review  [email or web address]

## 2021-11-05 NOTE — Progress Notes (Signed)
Physical Therapy Treatment Patient Details Name: Courtney Gray MRN: [id number] DOB: 27-Nov-1970 Today's Date: 11/05/2021   History of Present Illness Pt adm 7/28 with acute pulmonary edema. 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    PT Comments    Pt had just moved to low air loss mattress and was disappointed in lack of mobility available to her. Focus of session on improving pt independence in bed mobility despite the unstable mattress surface. Pt empowered to use bed controls to maximize postioning for rolling and coming to longsitting. With maxA pt able to come on to B side in position to perform hip ROM. Pt also able to perform supine to longsitting with assist of bed. Also discussed possibility of finding wheelchair with increased width to improve out of bed mobility. D/c plan remains appropriate at this time. PT will continue to follow acutely.   Recommendations for follow up therapy are one component of a multi-disciplinary discharge planning process, led by the attending physician.  Recommendations may be updated based on patient status, additional functional criteria and insurance authorization.  Follow Up Recommendations  PT at Long-term acute care hospital Can patient physically be transported by private vehicle: No   Assistance Recommended at Discharge Frequent or constant Supervision/Assistance  Patient can return home with the following Two people to help with walking and/or transfers;Two people to help with bathing/dressing/bathroom;Assist for transportation   Equipment Recommendations  Wheelchair (measurements PT);Wheelchair cushion (measurements PT);Other (comment);Hospital bed (Needs wide w/c with cushion)    Recommendations for Other Services       Precautions / Restrictions Precautions Precautions: Fall Restrictions Weight Bearing Restrictions: Yes RLE Weight Bearing: Non  weight bearing LLE Weight Bearing: Non weight bearing     Mobility  Bed Mobility Overal bed mobility: Needs Assistance Bed Mobility: Supine to Sit, Sit to Supine, Rolling Rolling: Min assist   Supine to sit: Min assist (from fully supine to long sitting) Sit to supine: Supervision   General bed mobility comments: pt moved to low air loss mattress and struggles to maintain level of bed mobility that she enjoys on regular hospital bed. Worked with pt using bed controls to utilize [employer] and tilt of bed to maximize mobility, with HoB slightly flattened pt is able to bring opposite arm across to bed rail to come into partial sidelying, PT provided pad assist to come more fully onto side where she could work on hip ROM.Pt rolls better to L than R but able to come to both sides with maxA for hips, with HoB elevated and bed placed in reverse [place] pt able to use armrails to pull to longsitting with no assist    Transfers                   General transfer comment: Will need mechanical lift          Balance Overall balance assessment: Needs assistance Sitting-balance support: No upper extremity supported Sitting balance-[name] Scale: Good Sitting balance - Comments: with long sitting in bed                                    Cognition Arousal/Alertness: Awake/alert Behavior During Therapy: WFL for tasks assessed/performed Overall Cognitive Status: Within Functional Limits for tasks assessed  General Comments: pt with eyes closed intermittently, but awake        Exercises Amputee Exercises Hip Extension: AROM, Both, 10 reps, Supine Hip ABduction/ADduction: AROM, Both, 10 reps, Supine Hip Flexion/Marching: AROM, Both, 10 reps, Supine Other Exercises Other Exercises: 8x crunch to longsitting    General Comments General comments (skin integrity, edema, etc.): VSS on 4L O2 via [place]      Pertinent Vitals/Pain  Pain Assessment Pain Assessment: Faces Faces Pain Scale: Hurts a little bit Pain Location: LLE Pain Descriptors / Indicators: Guarding, Grimacing Pain Intervention(s): Limited activity within patient's tolerance, Monitored during session, Repositioned     PT Goals (current goals can now be found in the care plan section) Acute Rehab PT Goals Patient Stated Goal: Pt wants to get up into a w/c PT Goal Formulation: With patient Time For Goal Achievement: 11/17/21 Potential to Achieve Goals: Fair Progress towards PT goals: Progressing toward goals    Frequency    Min 2X/week      PT Plan Current plan remains appropriate    Co-evaluation PT/OT/SLP Co-Evaluation/Treatment: Yes            AM-PAC PT "6 Clicks" Mobility   Outcome Measure  Help needed turning from your back to your side while in a flat bed without using bedrails?: A Little Help needed moving from lying on your back to sitting on the side of a flat bed without using bedrails?: Total Help needed moving to and from a bed to a chair (including a wheelchair)?: Total Help needed standing up from a chair using your arms (e.g., wheelchair or bedside chair)?: Total Help needed to walk in hospital room?: Total Help needed climbing 3-5 steps with a railing? : Total 6 Click Score: 8    End of Session Equipment Utilized During Treatment: Oxygen Activity Tolerance: Patient tolerated treatment well Patient left: in bed;with call bell/phone within reach Nurse Communication: Other (comment) (Requested to charge nurse pt be moved to a room with a maxisky lift when a room with one becomes available) PT Visit Diagnosis: Other abnormalities of gait and mobility (R26.89);Muscle weakness (generalized) (M62.81) Pain - Right/Left: Left Pain - part of body:  (residual limb)     Time: 7893-8101 PT Time Calculation (min) (ACUTE ONLY): 24 min  Charges:  $Therapeutic Exercise: 8-22 mins $Therapeutic Activity: 8-22 mins                      Courtney Gray B. Courtney Gray PT, DPT Acute Rehabilitation Services Please use secure chat or  Call Office [phone]    [place] 11/05/2021, 1:00 PM

## 2021-11-05 NOTE — Progress Notes (Addendum)
[facility] Progress Note   Subjective:   Patient seen and examined at bedside.  Just completed PT.  A little more comfortable in new bed today.  Denies CP, SOB, abdominal pain and vomiting and diarrhea.  Admits to nausea this AM.   Objective Vitals:   11/05/21 0324 11/05/21 0507 11/05/21 0738 11/05/21 1136  BP:  (!) 164/116 (!) 139/90 (!) 151/85  Pulse:  90 80 70  Resp:  18 (!) 22 (!) 22  Temp:  98.6 F (37 C) 98.4 F (36.9 C) 98.6 F (37 C)  TempSrc:  Oral Oral Oral  SpO2:  99% 99% 100%  Weight:      Height: 5\' 5"  (1.651 m)      Physical Exam General:chronically ill appearing, obese female in NAD Heart:RRR, no mrg Lungs:BS decreased, nml WOB on 4L O2 via [place] Abdomen:soft, NTND Extremities:no LE edema, b/l AKA Dialysis Access: [facility]   Filed Weights   11/02/21 1430 11/04/21 1117 11/04/21 1540  Weight: (!) 149.3 kg (!) 177 kg (!) 171 kg    Intake/Output Summary (Last 24 hours) at 11/05/2021 1253 Last data filed at 11/05/2021 1045 Gross per 24 hour  Intake 1260 ml  Output 2.5 ml  Net 1257.5 ml    Additional Objective Labs: Basic Metabolic Panel: Recent Labs  Lab 11/01/21 1740 11/02/21 0302 11/04/21 0250  NA 129* 131* 134*  K 5.0 3.6 3.5  CL 93* 94* 99  CO2 23 28 25   GLUCOSE 140* 180* 109*  BUN 28* 30* 24*  CREATININE 5.71* 6.19* 5.56*  CALCIUM 8.0* 8.1* 8.5*  PHOS  --   --  2.8   Liver Function Tests: Recent Labs  Lab 11/01/21 1740  AST 33  ALT 8  ALKPHOS 72  BILITOT 1.5*  PROT 6.6  ALBUMIN 2.2*   CBC: Recent Labs  Lab 11/01/21 1336 11/02/21 0302  WBC 7.3 14.0*  NEUTROABS 5.7  --   HGB 15.1* 9.7*  HCT 49.9* 30.1*  MCV 94.7 92.9  PLT 152 261     CBG: Recent Labs  Lab 11/04/21 0712 11/04/21 1712 11/04/21 2048 11/05/21 0745 11/05/21 1149  GLUCAP 100* 96 127* 99 159*    Medications:  ceFEPime (MAXIPIME) IV Stopped (11/05/21 0701)    atorvastatin  10 mg Oral Daily   calcium carbonate  1,000 mg Oral TID WC    Chlorhexidine Gluconate Cloth  6 each Topical Q0600   docusate sodium  100 mg Oral BID   gabapentin  100 mg Oral BID   Gerhardt's butt cream   Topical TID   heparin  5,000 Units Subcutaneous Q12H   insulin aspart  0-5 Units Subcutaneous QHS   insulin aspart  0-6 Units Subcutaneous TID WC   isosorbide mononitrate  30 mg Oral Daily   metoprolol tartrate  25 mg Oral BID   metroNIDAZOLE  500 mg Oral Q12H   mouth rinse  15 mL Mouth Rinse 4 times per day   pantoprazole  40 mg Oral Daily   polyethylene glycol  17 g Oral Daily    Dialysis Orders: MWF - [facility] 4hrs, BFR 400, DFR 600,  AVF, EDW 146kg, 3K/ 2Ca Heparin 2500 units with HD Hectorol 48mcg IV qHD-last 10/30/21     Assessment/Plan: ESRD - on HD MWF. HD tomorrow per regular schedule.  Hypertension/volume- BP variable, UF as able with HD.  Metoprolol 25mg  BID started yesterday.  Does not appear grossly volume overloaded, not close to EDW per bed weights.  Anemia  of CKD - last Hgb 9.7. No Fe/ESA at this time.  If continues to be <10 will start ESA.  Secondary Hyperparathyroidism - Ca in goal. continue Hectorol-  check phos . Not on binders.  Nutrition - Renal diet with fluid restriction Dispo-Very difficulty situation. Patient required 4 staff members and 2 hoyer pads for transfer into HD chair as OP. Not staying on HD for full treatment d/t pain for multiple decubitus ulcers. This is a major safety concern for staff and patient in the outpatient center. Also concern for inability for ulcers to heal properly d/t frequent and rigorous transfers that are required. Need to determine new placement at a facility where she can receive HD in the bed-more likley a LTAC if possible.   Decubs-  abx and local wound care Recent septic shock 2/2 bacteremia/liver abscess - on IV cefepime w/HD until 11/08/21  Jen Mow, PA-C [facility] 11/05/2021,12:53 PM  LOS: 4 days   Nephrology attending: The patient was seen  and examined.  Chart reviewed and I agree with above. ESRD on HD, had HD yesterday with 2.5 L UF.  Volume status looks acceptable.  Difficult discharge condition as above.  Katheran James, [facility].

## 2021-11-06 DIAGNOSIS — N186 End stage renal disease: Secondary | ICD-10-CM | POA: Diagnosis not present

## 2021-11-06 LAB — COMPREHENSIVE METABOLIC PANEL
ALT: 7 U/L (ref 0–44)
AST: 28 U/L (ref 15–41)
Albumin: 2.2 g/dL — ABNORMAL LOW (ref 3.5–5.0)
Alkaline Phosphatase: 62 U/L (ref 38–126)
Anion gap: 12 (ref 5–15)
BUN: 24 mg/dL — ABNORMAL HIGH (ref 6–20)
CO2: 22 mmol/L (ref 22–32)
Calcium: 8.8 mg/dL — ABNORMAL LOW (ref 8.9–10.3)
Chloride: 99 mmol/L (ref 98–111)
Creatinine, Ser: 5.32 mg/dL — ABNORMAL HIGH (ref 0.44–1.00)
GFR, Estimated: 9 mL/min — ABNORMAL LOW (ref >60)
Glucose, Bld: 82 mg/dL (ref 70–99)
Potassium: 5.2 mmol/L — ABNORMAL HIGH (ref 3.5–5.1)
Sodium: 133 mmol/L — ABNORMAL LOW (ref 135–145)
Total Bilirubin: 0.9 mg/dL (ref 0.3–1.2)
Total Protein: 6.9 g/dL (ref 6.5–8.1)

## 2021-11-06 LAB — CBC
HCT: 29 % — ABNORMAL LOW (ref 36.0–46.0)
Hemoglobin: 9 g/dL — ABNORMAL LOW (ref 12.0–15.0)
MCH: 29.6 pg (ref 26.0–34.0)
MCHC: 31 g/dL (ref 30.0–36.0)
MCV: 95.4 fL (ref 80.0–100.0)
Platelets: 234 10*3/uL (ref 150–400)
RBC: 3.04 MIL/uL — ABNORMAL LOW (ref 3.87–5.11)
RDW: 17.3 % — ABNORMAL HIGH (ref 11.5–15.5)
WBC: 10.3 10*3/uL (ref 4.0–10.5)
nRBC: 0 % (ref 0.0–0.2)

## 2021-11-06 LAB — GLUCOSE, CAPILLARY
Glucose-Capillary: 88 mg/dL (ref 70–99)
Glucose-Capillary: 106 mg/dL — ABNORMAL HIGH (ref 70–99)
Glucose-Capillary: 81 mg/dL (ref 70–99)
Glucose-Capillary: 70 mg/dL (ref 70–99)

## 2021-11-06 MED ORDER — HEPARIN SODIUM (PORCINE) 1000 UNIT/ML IJ SOLN
INTRAMUSCULAR | Status: AC
  Administered 2021-11-06: 4200 [IU] via INTRAVENOUS_CENTRAL
  Filled 2021-11-06: qty 5

## 2021-11-06 NOTE — Progress Notes (Signed)
Received patient in bed in the unit with on going HD reatment. Alert and oriented.  Informed consent signed and in  chart.   Treatment completed: 2205H  Patient tolerated well.  Transported back to the room  alert, without acute distress.  Hand-off given to patient's nurse.   Access used: CVC Access issues: None  Total UF removed: 2068ml Medication(s) given: None Post HD VS: BP 181/87mmhg, HR 85bmp, RR 21brpm T-98.42F Post HD weight: 170kg   [facility]

## 2021-11-06 NOTE — Progress Notes (Addendum)
[facility] Progress Note   Subjective:   Patient seen and examined at bedside.  Did not eat breakfast this AM due to nausea.  Denies vomiting, diarrhea, abdominal pain, CP and SOB.   Objective Vitals:   11/05/21 1136 11/05/21 1644 11/05/21 2111 11/06/21 0532  BP: (!) 151/85 (!) 153/78 138/76 (!) 125/99  Pulse: 70 78 81 76  Resp: (!) 22 20 17 18   Temp: 98.6 F (37 C) 98.1 F (36.7 C) 98 F (36.7 C) 98 F (36.7 C)  TempSrc: Oral Oral  Oral  SpO2: 100% 100% 100% 100%  Weight:      Height:       Physical Exam General:chronically ill appearing female in NAD Heart:RRR, no mrg Lungs:BS decreased, mostly CTAB, nml WOB on 4L O2 via [place] Abdomen:soft, NTND Extremities:b/l AKA Dialysis Access: [facility]   Filed Weights   11/02/21 1430 11/04/21 1117 11/04/21 1540  Weight: (!) 149.3 kg (!) 177 kg (!) 171 kg    Intake/Output Summary (Last 24 hours) at 11/06/2021 1204 Last data filed at 11/06/2021 0800 Gross per 24 hour  Intake 720 ml  Output 0 ml  Net 720 ml    Additional Objective Labs: Basic Metabolic Panel: Recent Labs  Lab 11/02/21 0302 11/04/21 0250 11/06/21 0403  NA 131* 134* 133*  K 3.6 3.5 5.2*  CL 94* 99 99  CO2 28 25 22   GLUCOSE 180* 109* 82  BUN 30* 24* 24*  CREATININE 6.19* 5.56* 5.32*  CALCIUM 8.1* 8.5* 8.8*  PHOS  --  2.8  --    Liver Function Tests: Recent Labs  Lab 11/01/21 1740 11/06/21 0403  AST 33 28  ALT 8 7  ALKPHOS 72 62  BILITOT 1.5* 0.9  PROT 6.6 6.9  ALBUMIN 2.2* 2.2*    CBC: Recent Labs  Lab 11/01/21 1336 11/02/21 0302  WBC 7.3 14.0*  NEUTROABS 5.7  --   HGB 15.1* 9.7*  HCT 49.9* 30.1*  MCV 94.7 92.9  PLT 152 261   CBG: Recent Labs  Lab 11/05/21 1149 11/05/21 1540 11/05/21 2108 11/06/21 0530 11/06/21 1143  GLUCAP 159* 145* 118* 88 106*    Medications:  ceFEPime (MAXIPIME) IV Stopped (11/05/21 0701)    atorvastatin  10 mg Oral Daily   calcium carbonate  1,000 mg Oral TID WC   Chlorhexidine Gluconate  Cloth  6 each Topical Q0600   docusate sodium  100 mg Oral BID   gabapentin  100 mg Oral BID   Gerhardt's butt cream   Topical TID   heparin  5,000 Units Subcutaneous Q12H   insulin aspart  0-5 Units Subcutaneous QHS   insulin aspart  0-6 Units Subcutaneous TID WC   isosorbide mononitrate  30 mg Oral Daily   metoprolol tartrate  25 mg Oral BID   metroNIDAZOLE  500 mg Oral Q12H   mouth rinse  15 mL Mouth Rinse 4 times per day   pantoprazole  40 mg Oral Daily   polyethylene glycol  17 g Oral Daily    Dialysis Orders: MWF - [facility] 4hrs, BFR 400, DFR 600,  AVF, EDW 146kg, 3K/ 2Ca Heparin 2500 units with HD Hectorol 69mcg IV qHD-last 10/30/21     Assessment/Plan: ESRD - on HD MWF. HD today per regular schedule.  Hypertension/volume- BP variable, UF as able with HD.  Metoprolol 25mg  BID started 11/04/21.  Does not appear grossly volume overloaded, not close to EDW per bed weights.  Anemia of CKD - last Hgb 9.7.  No Fe/ESA at this time.  If continues to be <10 will start ESA.  Secondary Hyperparathyroidism - Ca and phos in goal. continue Hectorol. Not on binders.  Nutrition - Renal diet with fluid restriction Dispo-Very difficulty situation. Patient required 4 staff members and 2 hoyer pads for transfer into HD chair as OP. Not staying on HD for full treatment d/t pain for multiple decubitus ulcers. This is a major safety concern for staff and patient in the outpatient center. Also concern for inability for ulcers to heal properly d/t frequent and rigorous transfers that are required. Need to determine new placement at a facility where she can receive HD in the bed-more likley a LTAC if possible.   Decubs-  abx and local wound care Recent septic shock 2/2 bacteremia/liver abscess - on IV cefepime w/HD until 11/08/21  Jen Mow, PA-C [facility] 11/06/2021,12:04 PM  LOS: 5 days   Nephrology attending: I have personally seen and examined the patient.   Chart reviewed and I agree with above. ESRD on HD receiving dialysis as scheduled.  Plan for regular HD treatment today.  Outpatient discharge planning ongoing, looking into [facility] where she can be accepted to do dialysis in the stretcher.  Clinically stable. Katheran James, [facility].

## 2021-11-06 NOTE — Progress Notes (Signed)
PROGRESS NOTE    Courtney Gray  INO:[id number] DOB: [birth year] DOA: 11/01/2021 PCP: Monico Blitz, MD   Brief Narrative:  51 y.o. female with medical history significant of ESRD on HD TTS, recent bacteremia and hepatic abscess on long-term IV antibiotics, chronic HF PEF, PVD status post bilateral AKA, IIDM, morbid obesity, chronic sacral wound stage II, presented with increasing shortness of breath.   Patient underwent bilateral AKA recently including right AKA in May and left AKA on 7/5.  And patient was recently diagnosed with septic shock secondary to bacteremia and liver abscess, for which patient has been on cefepime and Flagyl and the last dose will be August 4.  Recently, since patient has bilateral AKA, she has had extreme difficulty to get access to outpatient HD center.  She never missed her dialysis however according to outpatient HD records, it takes at least 4 miles stop and [facility] lift for patient to be seated in the dialysis chair.  Starting today, patient started to feel shortness of breath, no chest pain no swelling.  No cough no fever or chills    Assessment & Plan:   Active Problems:   End stage renal disease (HCC)   Acute pulmonary edema (HCC)   Fluid overload  Assessment and Plan:   Acute on chronic HFpEF with chronic hypoxemic respiratory failure -Secondary to uncontrolled hypertension, clinical has signs of mild fluid overload at presentation .Imdur was started at presentation Volume status improved with HD   HTN emergency -BP seems improved   ESRD on HD -Admitting physician discussed with ED physician and case manager, likely patient will need long-term dialysis plan as currently, increasingly, outpatient HD center high hard time accommodate patient's dialysis.   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 On recent admit, developed septic shock from bacteroides thetaiotaomicron bacteremia with septic emboli to spleen, initially on anticoagulation which was later held as splenic infarct was determined to be infectious and not blood clot -Pt receiving IV cefepime on HD and flagyl, to end 8/4   PAD -Pt s/p B AKA for gangrene with plan to f/u with vascular surgery in 1 month -Pt was s/p L middle finger amputation for gangrene at recent admit     DVT prophylaxis:Heparin Code Status: Full Family Communication: None at bedside Disposition Plan:  Status is: Inpatient Remains inpatient appropriate because: IV medications   Consultants:  Nephrology  Procedures:  None  Antimicrobials:  Anti-infectives (From admission, onward)    Start     Dose/Rate Route Frequency Ordered Stop   11/04/21 1800  ceFEPIme (MAXIPIME) 2 g in sodium chloride 0.9 % 100 mL IVPB        2 g 200 mL/hr over 30 Minutes Intravenous Every M-W-F (1800) 11/03/21 0962 11/08/21 2359   11/04/21 1200  ceFEPIme (MAXIPIME) 2 g in sodium chloride 0.9 % 100 mL IVPB  Status:  Discontinued        2 g 200 mL/hr over 30 Minutes Intravenous Every M-W-F (Hemodialysis) 11/02/21 0941 11/03/21 0952   11/02/21 1800  ceFEPIme (MAXIPIME) 2 g in sodium chloride 0.9 % 100 mL IVPB        2 g 200 mL/hr over 30 Minutes Intravenous  Once 11/02/21 0941 11/02/21 1812  11/02/21 1200  ceFEPIme (MAXIPIME) 2 g in sodium chloride 0.9 % 100 mL IVPB  Status:  Discontinued        2 g 200 mL/hr over 30 Minutes Intravenous Every T-Th-Sa (Hemodialysis) 11/01/21 1934 11/02/21 0941   11/01/21 2200  metroNIDAZOLE (FLAGYL) tablet 500 mg        500 mg Oral Every 12 hours 11/01/21 1827 11/08/21 2200   11/01/21 1945  ceFEPIme (MAXIPIME) 1 g in sodium chloride 0.9 % 100 mL IVPB        1 g 200 mL/hr over  30 Minutes Intravenous  Once 11/01/21 1933 11/01/21 2032   11/01/21 1730  ceFEPIme (MAXIPIME) 1 g in sodium chloride 0.9 % 100 mL IVPB  Status:  Discontinued        1 g 200 mL/hr over 30 Minutes Intravenous Every 24 hours 11/01/21 1720 11/01/21 1933       Subjective: Patient seen and evaluated today with no new acute complaints or concerns. No acute concerns or events noted overnight. Awaiting HD today.  Objective: Vitals:   11/05/21 1136 11/05/21 1644 11/05/21 2111 11/06/21 0532  BP: (!) 151/85 (!) 153/78 138/76 (!) 125/99  Pulse: 70 78 81 76  Resp: (!) 22 20 17 18   Temp: 98.6 F (37 C) 98.1 F (36.7 C) 98 F (36.7 C) 98 F (36.7 C)  TempSrc: Oral Oral  Oral  SpO2: 100% 100% 100% 100%  Weight:      Height:        Intake/Output Summary (Last 24 hours) at 11/06/2021 1007 Last data filed at 11/06/2021 0800 Gross per 24 hour  Intake 1080 ml  Output 0 ml  Net 1080 ml   Filed Weights   11/02/21 1430 11/04/21 1117 11/04/21 1540  Weight: (!) 149.3 kg (!) 177 kg (!) 171 kg    Examination:  General exam: Appears calm and comfortable, morbidly obese Respiratory system: Clear to auscultation. Respiratory effort normal. 4L [place] Cardiovascular system: S1 & S2 heard, RRR.  Gastrointestinal system: Abdomen is soft Central nervous system: Alert and awake Extremities: Bilateral AKA Skin: No significant lesions noted Psychiatry: Flat affect.    Data Reviewed: I have personally reviewed following labs and imaging studies  CBC: Recent Labs  Lab 11/01/21 1336 11/02/21 0302  WBC 7.3 14.0*  NEUTROABS 5.7  --   HGB 15.1* 9.7*  HCT 49.9* 30.1*  MCV 94.7 92.9  PLT 152 509   Basic Metabolic Panel: Recent Labs  Lab 11/01/21 1740 11/02/21 0302 11/04/21 0250 11/06/21 0403  NA 129* 131* 134* 133*  K 5.0 3.6 3.5 5.2*  CL 93* 94* 99 99  CO2 23 28 25 22   GLUCOSE 140* 180* 109* 82  BUN 28* 30* 24* 24*  CREATININE 5.71* 6.19* 5.56* 5.32*  CALCIUM 8.0* 8.1* 8.5* 8.8*  MG  --    --  1.8  --   PHOS  --   --  2.8  --    GFR: Estimated Creatinine Clearance: 20.3 mL/min (A) (by C-G formula based on SCr of 5.32 mg/dL (H)). Liver Function Tests: Recent Labs  Lab 11/01/21 1740 11/06/21 0403  AST 33 28  ALT 8 7  ALKPHOS 72 62  BILITOT 1.5* 0.9  PROT 6.6 6.9  ALBUMIN 2.2* 2.2*   No results for input(s): "LIPASE", "AMYLASE" in the last 168 hours. No results for input(s): "AMMONIA" in the last 168 hours. Coagulation Profile: No results for input(s): "INR", "PROTIME" in the last 168 hours. Cardiac Enzymes: No results for  input(s): "CKTOTAL", "CKMB", "CKMBINDEX", "TROPONINI" in the last 168 hours. BNP (last 3 results) No results for input(s): "PROBNP" in the last 8760 hours. HbA1C: No results for input(s): "HGBA1C" in the last 72 hours. CBG: Recent Labs  Lab 11/05/21 0745 11/05/21 1149 11/05/21 1540 11/05/21 2108 11/06/21 0530  GLUCAP 99 159* 145* 118* 88   Lipid Profile: No results for input(s): "CHOL", "HDL", "LDLCALC", "TRIG", "CHOLHDL", "LDLDIRECT" in the last 72 hours. Thyroid Function Tests: No results for input(s): "TSH", "T4TOTAL", "FREET4", "T3FREE", "THYROIDAB" in the last 72 hours. Anemia Panel: No results for input(s): "VITAMINB12", "FOLATE", "FERRITIN", "TIBC", "IRON", "RETICCTPCT" in the last 72 hours. Sepsis Labs: No results for input(s): "PROCALCITON", "LATICACIDVEN" in the last 168 hours.  No results found for this or any previous visit (from the past 240 hour(s)).       Radiology Studies: No results found.      Scheduled Meds:  atorvastatin  10 mg Oral Daily   calcium carbonate  1,000 mg Oral TID WC   Chlorhexidine Gluconate Cloth  6 each Topical Q0600   docusate sodium  100 mg Oral BID   gabapentin  100 mg Oral BID   Gerhardt's butt cream   Topical TID   heparin  5,000 Units Subcutaneous Q12H   insulin aspart  0-5 Units Subcutaneous QHS   insulin aspart  0-6 Units Subcutaneous TID WC   isosorbide mononitrate  30 mg  Oral Daily   metoprolol tartrate  25 mg Oral BID   metroNIDAZOLE  500 mg Oral Q12H   mouth rinse  15 mL Mouth Rinse 4 times per day   pantoprazole  40 mg Oral Daily   polyethylene glycol  17 g Oral Daily   Continuous Infusions:  ceFEPime (MAXIPIME) IV Stopped (11/05/21 0701)     LOS: 5 days    Time spent: 35 minutes    Obbie Lewallen Darleen Crocker, DO [facility]  If 7PM-7AM, please contact night-coverage [email or web address] 11/06/2021, 10:07 AM

## 2021-11-06 NOTE — Progress Notes (Signed)
Occupational Therapy Treatment Patient Details Name: Courtney Gray MRN: [id number] DOB: 11-Apr-1970 Today's Date: 11/06/2021   History of present illness Pt adm 7/28 with acute pulmonary edema. 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   OT comments  Pt progressing towards goals, able to complete UB bathing and grooming tasks with supervision-minA. Pt able to complete BUE therex with blue theraband in long sitting. Pt presenting with impairments listed below, will follow acutely. Continue to recommend OT at [facility] at d/c.   Recommendations for follow up therapy are one component of a multi-disciplinary discharge planning process, led by the attending physician.  Recommendations may be updated based on patient status, additional functional criteria and insurance authorization.    Follow Up Recommendations  OT at Long-term acute care hospital    Assistance Recommended at Discharge Intermittent Supervision/Assistance  Patient can return home with the following  Two people to help with walking and/or transfers;Two people to help with bathing/dressing/bathroom;Assistance with cooking/housework;Direct supervision/assist for medications management;Assist for transportation;Help with stairs or ramp for entrance   Equipment Recommendations  Other (comment) (defer)    Recommendations for Other Services PT consult    Precautions / Restrictions Precautions Precautions: Fall Precaution Comments: bil AKA, 3L O2 at baseline, LUQ drain Restrictions Weight Bearing Restrictions: Yes RLE Weight Bearing: Non weight bearing LLE Weight Bearing: Non weight bearing       Mobility Bed Mobility Overal bed mobility: Needs Assistance       Supine to sit: Min guard Sit to supine: Min guard   General bed mobility comments: for long sitting    Transfers                         Balance Overall balance  assessment: Needs assistance Sitting-balance support: No upper extremity supported Sitting balance-[name] Scale: Good Sitting balance - Comments: with long sitting in bed                                   ADL either performed or assessed with clinical judgement   ADL Overall ADL's : Needs assistance/impaired     Grooming: Supervision/safety;Sitting Grooming Details (indicate cue type and reason): light grooming sitting up in bed         Upper Body Dressing : Minimal assistance;Sitting Upper Body Dressing Details (indicate cue type and reason): to wash back                        Extremity/Trunk Assessment Upper Extremity Assessment Upper Extremity Assessment: Generalized weakness (impaired fine motor coordination)   Lower Extremity Assessment Lower Extremity Assessment: Defer to PT evaluation        Vision   Vision Assessment?: No apparent visual deficits   Perception Perception Perception: Not tested   Praxis Praxis Praxis: Not tested    Cognition Arousal/Alertness: Awake/alert Behavior During Therapy: WFL for tasks assessed/performed Overall Cognitive Status: Within Functional Limits for tasks assessed                                 General Comments: slow to respond        Exercises General Exercises - Upper Extremity Shoulder Flexion: Strengthening, Right, 10 reps, Seated, Theraband Theraband Level (Shoulder Flexion): Level 4 (  Blue) Shoulder Horizontal ABduction: AROM, Both, 10 reps, Supine, Theraband Theraband Level (Shoulder Horizontal Abduction): Level 4 (Blue) Elbow Flexion: AROM, Both, 10 reps, Supine Theraband Level (Elbow Flexion): Level 4 (Blue)    Shoulder Instructions       General Comments VSS on supplemental O2    Pertinent Vitals/ Pain       Pain Assessment Pain Assessment: No/denies pain  Home Living                                          Prior Functioning/Environment               Frequency  Min 2X/week        Progress Toward Goals  OT Goals(current goals can now be found in the care plan section)  Progress towards OT goals: Progressing toward goals  Acute Rehab OT Goals Patient Stated Goal: to get better OT Goal Formulation: With patient Time For Goal Achievement: 11/17/21 Potential to Achieve Goals: Fair ADL Goals Pt Will Perform Grooming: Independently;sitting;bed level Pt Will Perform Lower Body Bathing: with mod assist;with adaptive equipment;sitting/lateral leans Pt Will Perform Lower Body Dressing: with mod assist;with adaptive equipment;sitting/lateral leans Pt Will Transfer to Toilet: with mod assist;with transfer board;bedside commode Pt/caregiver will Perform Home Exercise Program: Increased strength;Both right and left upper extremity;With theraband;Independently Additional ADL Goal #1: Pt will perform bed mobility independently in prep for ADLs Additional ADL Goal #2: Pt will roll with min assist for pericare and pressure relief.  Plan Discharge plan needs to be updated    Co-evaluation                 AM-PAC OT "6 Clicks" Daily Activity     Outcome Measure   Help from another person eating meals?: A Little Help from another person taking care of personal grooming?: A Little Help from another person toileting, which includes using toliet, bedpan, or urinal?: Total Help from another person bathing (including washing, rinsing, drying)?: A Lot Help from another person to put on and taking off regular upper body clothing?: A Little Help from another person to put on and taking off regular lower body clothing?: A Lot 6 Click Score: 14    End of Session Equipment Utilized During Treatment: Oxygen  OT Visit Diagnosis: Muscle weakness (generalized) (M62.81);History of falling (Z91.81) Pain - Right/Left: Right Pain - part of body: Leg   Activity Tolerance Patient tolerated treatment well   Patient Left in bed;with  call bell/phone within reach   Nurse Communication Mobility status        Time: 0768-0881 OT Time Calculation (min): 37 min  Charges: OT General Charges $OT Visit: 1 Visit OT Treatments $Self Care/Home Management : 23-37 mins  [name], OTD, OTR/L Acute Rehab [phone] - [place] 11/06/2021, 4:32 PM

## 2021-11-06 NOTE — Progress Notes (Signed)
Spoke to [employer] with [employer]. Maudie Mercury reports that [facility] in [facility] can sometimes accept pts who need stretcher HD. Pt's information faxed to Maudie Mercury this morning for review. Will assist as needed.   Melven Sartorius Renal Navigator [phone]

## 2021-11-07 DIAGNOSIS — N186 End stage renal disease: Secondary | ICD-10-CM | POA: Diagnosis not present

## 2021-11-07 LAB — GLUCOSE, CAPILLARY
Glucose-Capillary: 108 mg/dL — ABNORMAL HIGH (ref 70–99)
Glucose-Capillary: 109 mg/dL — ABNORMAL HIGH (ref 70–99)
Glucose-Capillary: 110 mg/dL — ABNORMAL HIGH (ref 70–99)
Glucose-Capillary: 115 mg/dL — ABNORMAL HIGH (ref 70–99)
Glucose-Capillary: 115 mg/dL — ABNORMAL HIGH (ref 70–99)

## 2021-11-07 MED ORDER — PROCHLORPERAZINE EDISYLATE 10 MG/2ML IJ SOLN
5.0000 mg | Freq: Once | INTRAMUSCULAR | Status: AC
  Administered 2021-11-07: 5 mg via INTRAVENOUS
  Filled 2021-11-07: qty 2

## 2021-11-07 MED ORDER — PROCHLORPERAZINE EDISYLATE 10 MG/2ML IJ SOLN
5.0000 mg | Freq: Once | INTRAMUSCULAR | Status: DC
  Filled 2021-11-07: qty 2

## 2021-11-07 MED ORDER — SORBITOL 70 % SOLN
960.0000 mL | TOPICAL_OIL | Freq: Once | ORAL | Status: DC
  Filled 2021-11-07: qty 473

## 2021-11-07 MED ORDER — SORBITOL 70 % SOLN: 960.0000 mL | TOPICAL_OIL | Freq: Once | ORAL | Status: DC

## 2021-11-07 MED ORDER — PROMETHAZINE HCL 25 MG/ML IJ SOLN
12.5000 mg | Freq: Four times a day (QID) | INTRAMUSCULAR | Status: DC | PRN
Start: 2021-11-07 — End: 2021-11-28
  Administered 2021-11-08: 12.5 mg via INTRAMUSCULAR
  Filled 2021-11-07: qty 1

## 2021-11-07 MED ORDER — BISACODYL 10 MG RE SUPP
10.0000 mg | Freq: Once | RECTAL | Status: DC
  Filled 2021-11-07: qty 1

## 2021-11-07 NOTE — Plan of Care (Signed)
  Problem: Education: Goal: Knowledge of the prescribed therapeutic regimen will improve Outcome: Not Applicable Goal: Ability to verbalize activity precautions or restrictions will improve Outcome: Not Applicable   Problem: Activity: Goal: Ability to perform//tolerate increased activity and mobilize with assistive devices will improve Outcome: Not Applicable   Problem: Clinical Measurements: Goal: Postoperative complications will be avoided or minimized Outcome: Not Applicable   Problem: Self-Care: Goal: Ability to meet self-care needs will improve Outcome: Not Applicable   Problem: Self-Concept: Goal: Ability to maintain and perform role responsibilities to the fullest extent possible will improve Outcome: Not Applicable   Problem: Pain Management: Goal: Pain level will decrease with appropriate interventions Outcome: Not Applicable   Problem: Skin Integrity: Goal: Demonstration of wound healing without infection will improve Outcome: Not Applicable

## 2021-11-07 NOTE — TOC Progression Note (Signed)
Transition of Care [facility]) - Initial/Assessment Note    Patient Details  Name: Courtney Gray MRN: [id number] Date of Birth: 03/01/1971  Transition of [facility]) CM/SW Contact:    Milinda Antis, [place] Phone Number: 11/07/2021, 4:13 PM  Clinical Narrative:                 CSW contacted previous SNF, [facility], to inquire about the patient returning if a dialysis center that provides stretcher dialysis is found.  Christine in admissions reported that she will consult with DON and inform CSW of outcome.  CSW attempted to contact the patient's daughter by phone.  There was no answer.  CSW left VM requesting a returned call.          Patient Goals and CMS Choice        Expected Discharge Plan and Services                                                Prior Living Arrangements/Services                       Activities of Daily Living Home Assistive Devices/Equipment: Hoyer Lift ADL Screening (condition at time of admission) Patient's cognitive ability adequate to safely complete daily activities?: No Is the patient deaf or have difficulty hearing?: No Does the patient have difficulty seeing, even when wearing glasses/contacts?: No Does the patient have difficulty concentrating, remembering, or making decisions?: No Patient able to express need for assistance with ADLs?: Yes Does the patient have difficulty dressing or bathing?: Yes Independently performs ADLs?: No Does the patient have difficulty walking or climbing stairs?: Yes Weakness of Legs: Both Weakness of Arms/Hands: Both  Permission Sought/Granted                  Emotional Assessment              Admission diagnosis:  End stage renal disease ([place]) [N18.6] Fluid overload [E87.70] Obesity with serious comorbidity, unspecified classification, unspecified obesity type [E66.9] Pressure injury of right buttock, stage 2 ([place]) [L89.312] Patient Active Problem List   Diagnosis  Date Noted   Fluid overload 11/01/2021   Hepatic abscess    Pressure injury of skin 09/13/2021   PVD (peripheral vascular disease) ([place])    Hx of AKA (above knee amputation), right ([place])    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   Lactic acidosis 09/10/2021   Mixed diabetic hyperlipidemia associated with type 2 diabetes mellitus ([place]) 09/10/2021   Coronary artery disease involving native coronary artery of native heart without angina pectoris 09/10/2021   Elevated troponin level not due myocardial infarction 09/10/2021   Hypotension 09/10/2021   Dry gangrene ([place])    Pain 08/03/2021   Ischemic foot pain at rest 08/03/2021   Hypoglycemia 07/10/2021   Volume overload 07/09/2021   Acute pulmonary edema ([place])    Leukocytosis 06/11/2021   Acute gout 06/11/2021   Hyperkalemia 05/21/2021   Hemorrhoids    Hyperlipidemia 05/18/2021   Rectal bleeding 05/17/2021   Morbid obesity with BMI of 60.0-69.9, adult ([place]) 05/17/2021   NSTEMI (non-ST elevated myocardial infarction) ([place]) 05/16/2021   Essential hypertension 09/01/2020   End stage renal disease ([place]) 09/01/2020   Chronic respiratory failure with hypoxia ([place]) 09/01/2020   Hypertensive urgency 11/28/2019   Uncontrolled type 2 diabetes mellitus with hypoglycemia, with long-term  current use of insulin ([place]) 11/28/2019   Anemia of chronic disease 11/28/2019   PCP:  Monico Blitz, MD Pharmacy:   [place], [place] [address] Phone: [phone] Fax: [phone]  [facility] [address] [place] [address] Phone: [phone] Fax: [phone]  [place], [place] - [address] [place] [address] Phone: [phone] Fax: [phone]     Social Determinants of Health ([place]) Interventions    Readmission Risk Interventions    09/16/2021    4:36 PM 08/28/2021   11:50 AM 07/12/2021    12:38 PM  Readmission Risk Prevention Plan  Transportation Screening Complete Complete Complete  Medication Review (RN Care Manager)  Complete Complete  PCP or Specialist appointment within 3-5 days of discharge  Complete Complete  HRI or Home Care Consult Complete Complete Complete  SW Recovery Care/Counseling Consult Complete Complete Complete  Palliative Care Screening  Not Applicable Not Applicable  Skilled Nursing Facility Complete Not Applicable Complete

## 2021-11-07 NOTE — Progress Notes (Signed)
PROGRESS NOTE    Courtney Gray  OAC:[id number] DOB: 1970/11/09 DOA: 11/01/2021 PCP: Monico Blitz, MD   Brief Narrative:  51 y.o. female with medical history significant of ESRD on HD TTS, recent bacteremia and hepatic abscess on long-term IV antibiotics, chronic HF PEF, PVD status post bilateral AKA, IIDM, morbid obesity, chronic sacral wound stage II, presented with increasing shortness of breath.   Patient underwent bilateral AKA recently including right AKA in May and left AKA on 7/5.  And patient was recently diagnosed with septic shock secondary to bacteremia and liver abscess, for which patient has been on cefepime and Flagyl and the last dose will be August 4.  Recently, since patient has bilateral AKA, she has had extreme difficulty to get access to outpatient HD center.  She never missed her dialysis however according to outpatient HD records, it takes at least 4 miles stop and [facility] lift for patient to be seated in the dialysis chair.  Starting today, patient started to feel shortness of breath, no chest pain no swelling.  No cough no fever or chills    Assessment & Plan:   Active Problems:   End stage renal disease (HCC)   Acute pulmonary edema (HCC)   Fluid overload  Assessment and Plan:   Acute on chronic HFpEF with chronic hypoxemic respiratory failure -Secondary to uncontrolled hypertension, clinical has signs of mild fluid overload at presentation .Imdur was started at presentation Volume status improved with HD    HTN emergency -BP seems improved   ESRD on HD -Admitting physician discussed with ED physician and case manager, likely patient will need long-term dialysis plan as currently, increasingly, outpatient HD center high hard time accommodate patient's dialysis.  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 On recent admit, developed septic shock from bacteroides thetaiotaomicron bacteremia with septic emboli to spleen, initially on anticoagulation which was later held as splenic infarct was determined to be infectious and not blood clot -Pt receiving IV cefepime on HD and flagyl, to end 8/4   PAD -Pt s/p B AKA for gangrene with plan to f/u with vascular surgery in 1 month -Pt was s/p L middle finger amputation for gangrene at recent admit      DVT prophylaxis:Heparin Code Status: Full Family Communication: None at bedside Disposition Plan:  Status is: Inpatient Remains inpatient appropriate because: IV medications     Consultants:  Nephrology   Procedures:  None   Antimicrobials:  Anti-infectives (From admission, onward)    Start     Dose/Rate Route Frequency Ordered Stop   11/04/21 1800  ceFEPIme (MAXIPIME) 2 g in sodium chloride 0.9 % 100 mL IVPB        2 g 200 mL/hr over 30 Minutes Intravenous Every M-W-F (1800) 11/03/21 0109 11/08/21 2359   11/04/21 1200  ceFEPIme (MAXIPIME) 2 g in sodium chloride 0.9 % 100 mL IVPB  Status:  Discontinued        2 g 200 mL/hr over 30 Minutes Intravenous Every M-W-F (Hemodialysis) 11/02/21 3235  11/03/21 0952   11/02/21 1800  ceFEPIme (MAXIPIME) 2 g in sodium chloride 0.9 % 100 mL IVPB        2 g 200 mL/hr over 30 Minutes Intravenous  Once 11/02/21 0941 11/02/21 1812   11/02/21 1200  ceFEPIme (MAXIPIME) 2 g in sodium chloride 0.9 % 100 mL IVPB  Status:  Discontinued        2 g 200 mL/hr over 30 Minutes Intravenous Every T-Th-Sa  (Hemodialysis) 11/01/21 1934 11/02/21 0941   11/01/21 2200  metroNIDAZOLE (FLAGYL) tablet 500 mg        500 mg Oral Every 12 hours 11/01/21 1827 11/08/21 2200   11/01/21 1945  ceFEPIme (MAXIPIME) 1 g in sodium chloride 0.9 % 100 mL IVPB        1 g 200 mL/hr over 30 Minutes Intravenous  Once 11/01/21 1933 11/01/21 2032   11/01/21 1730  ceFEPIme (MAXIPIME) 1 g in sodium chloride 0.9 % 100 mL IVPB  Status:  Discontinued        1 g 200 mL/hr over 30 Minutes Intravenous Every 24 hours 11/01/21 1720 11/01/21 1933       Subjective: Patient seen and evaluated today with some nausea and minimal vomiting this morning.  She is also noted to have constipation with last BM over 2 days ago.  She is refusing lab work this morning.  Objective: Vitals:   11/06/21 2205 11/06/21 2220 11/06/21 2301 11/07/21 0825  BP: (!) 206/85 (!) 181/89 (!) 157/63 (!) 148/70  Pulse: 82 85 84 83  Resp: (!) 23  18 16   Temp: 98.3 F (36.8 C)  98.7 F (37.1 C)   TempSrc:   Oral   SpO2: 100% 100% 100% 99%  Weight: (!) 170 kg     Height:        Intake/Output Summary (Last 24 hours) at 11/07/2021 0941 Last data filed at 11/06/2021 2205 Gross per 24 hour  Intake 200 ml  Output 2000 ml  Net -1800 ml   Filed Weights   11/04/21 1540 11/06/21 1802 11/06/21 2205  Weight: (!) 171 kg (!) 172 kg (!) 170 kg    Examination:  General exam: Appears calm and comfortable, morbidly obese Respiratory system: Clear to auscultation. Respiratory effort normal.  4 L nasal cannula oxygen Cardiovascular system: S1 & S2 heard, RRR.  Gastrointestinal system: Abdomen is soft Central nervous system: Alert and awake Extremities: Bilateral AKA Skin: No significant lesions noted Psychiatry: Flat affect.    Data Reviewed: I have personally reviewed following labs and imaging studies  CBC: Recent Labs  Lab 11/01/21 1336 11/02/21 0302 11/06/21 1805  WBC 7.3 14.0* 10.3  NEUTROABS 5.7  --   --   HGB 15.1* 9.7* 9.0*  HCT 49.9*  30.1* 29.0*  MCV 94.7 92.9 95.4  PLT 152 261 235   Basic Metabolic Panel: Recent Labs  Lab 11/01/21 1740 11/02/21 0302 11/04/21 0250 11/06/21 0403  NA 129* 131* 134* 133*  K 5.0 3.6 3.5 5.2*  CL 93* 94* 99 99  CO2 23 28 25 22   GLUCOSE 140* 180* 109* 82  BUN 28* 30* 24* 24*  CREATININE 5.71* 6.19* 5.56* 5.32*  CALCIUM 8.0* 8.1* 8.5* 8.8*  MG  --   --  1.8  --   PHOS  --   --  2.8  --    GFR: Estimated Creatinine Clearance: 20.2 mL/min (A) (by C-G formula based on SCr of 5.32 mg/dL (H)). Liver Function Tests: Recent Labs  Lab 11/01/21 1740  11/06/21 0403  AST 33 28  ALT 8 7  ALKPHOS 72 62  BILITOT 1.5* 0.9  PROT 6.6 6.9  ALBUMIN 2.2* 2.2*   No results for input(s): "LIPASE", "AMYLASE" in the last 168 hours. No results for input(s): "AMMONIA" in the last 168 hours. Coagulation Profile: No results for input(s): "INR", "PROTIME" in the last 168 hours. Cardiac Enzymes: No results for input(s): "CKTOTAL", "CKMB", "CKMBINDEX", "TROPONINI" in the last 168 hours. BNP (last 3 results) No results for input(s): "PROBNP" in the last 8760 hours. HbA1C: No results for input(s): "HGBA1C" in the last 72 hours. CBG: Recent Labs  Lab 11/06/21 1143 11/06/21 1705 11/06/21 2301 11/07/21 0044 11/07/21 0742  GLUCAP 106* 81 70 115* 108*   Lipid Profile: No results for input(s): "CHOL", "HDL", "LDLCALC", "TRIG", "CHOLHDL", "LDLDIRECT" in the last 72 hours. Thyroid Function Tests: No results for input(s): "TSH", "T4TOTAL", "FREET4", "T3FREE", "THYROIDAB" in the last 72 hours. Anemia Panel: No results for input(s): "VITAMINB12", "FOLATE", "FERRITIN", "TIBC", "IRON", "RETICCTPCT" in the last 72 hours. Sepsis Labs: No results for input(s): "PROCALCITON", "LATICACIDVEN" in the last 168 hours.  No results found for this or any previous visit (from the past 240 hour(s)).       Radiology Studies: No results found.      Scheduled Meds:  atorvastatin  10 mg Oral Daily    bisacodyl  10 mg Rectal Once   calcium carbonate  1,000 mg Oral TID WC   Chlorhexidine Gluconate Cloth  6 each Topical Q0600   docusate sodium  100 mg Oral BID   gabapentin  100 mg Oral BID   Gerhardt's butt cream   Topical TID   heparin  5,000 Units Subcutaneous Q12H   insulin aspart  0-5 Units Subcutaneous QHS   insulin aspart  0-6 Units Subcutaneous TID WC   isosorbide mononitrate  30 mg Oral Daily   metoprolol tartrate  25 mg Oral BID   metroNIDAZOLE  500 mg Oral Q12H   mouth rinse  15 mL Mouth Rinse 4 times per day   pantoprazole  40 mg Oral Daily   polyethylene glycol  17 g Oral Daily   prochlorperazine  5 mg Intravenous Once   Continuous Infusions:  ceFEPime (MAXIPIME) IV 2 g (11/06/21 2330)     LOS: 6 days    Time spent: 35 minutes    Ermina Oberman Darleen Crocker, DO [facility]  If 7PM-7AM, please contact night-coverage [email or web address] 11/07/2021, 9:41 AM

## 2021-11-07 NOTE — Progress Notes (Addendum)
[facility] Progress Note   Subjective:   Patient seen and examined at bedside.  Reports heart fluttering during dialysis last night.  Now improved.  Denies CP, SOB, abdominal pain and v/d. Admits to nausea and constipation.   Objective Vitals:   11/06/21 2205 11/06/21 2220 11/06/21 2301 11/07/21 0825  BP: (!) 206/85 (!) 181/89 (!) 157/63 (!) 148/70  Pulse: 82 85 84 83  Resp: (!) 23  18 16   Temp: 98.3 F (36.8 C)  98.7 F (37.1 C)   TempSrc:   Oral   SpO2: 100% 100% 100% 99%  Weight: (!) 170 kg     Height:       Physical Exam General:chronically ill appearing obese female in NAD Heart:RRR, no mrg Lungs:breath sounds decreased, mostly CTAB, nml WOB on 4L O2 via [place] Abdomen:soft, NTND Extremities:b/l AKA, trace LE edema Dialysis Access: [facility]   Filed Weights   11/04/21 1540 11/06/21 1802 11/06/21 2205  Weight: (!) 171 kg (!) 172 kg (!) 170 kg    Intake/Output Summary (Last 24 hours) at 11/07/2021 0847 Last data filed at 11/06/2021 2205 Gross per 24 hour  Intake 200 ml  Output 2000 ml  Net -1800 ml    Additional Objective Labs: Basic Metabolic Panel: Recent Labs  Lab 11/02/21 0302 11/04/21 0250 11/06/21 0403  NA 131* 134* 133*  K 3.6 3.5 5.2*  CL 94* 99 99  CO2 28 25 22   GLUCOSE 180* 109* 82  BUN 30* 24* 24*  CREATININE 6.19* 5.56* 5.32*  CALCIUM 8.1* 8.5* 8.8*  PHOS  --  2.8  --    Liver Function Tests: Recent Labs  Lab 11/01/21 1740 11/06/21 0403  AST 33 28  ALT 8 7  ALKPHOS 72 62  BILITOT 1.5* 0.9  PROT 6.6 6.9  ALBUMIN 2.2* 2.2*   CBC: Recent Labs  Lab 11/01/21 1336 11/02/21 0302 11/06/21 1805  WBC 7.3 14.0* 10.3  NEUTROABS 5.7  --   --   HGB 15.1* 9.7* 9.0*  HCT 49.9* 30.1* 29.0*  MCV 94.7 92.9 95.4  PLT 152 261 234   CBG: Recent Labs  Lab 11/06/21 1143 11/06/21 1705 11/06/21 2301 11/07/21 0044 11/07/21 0742  GLUCAP 106* 81 70 115* 108*   Medications:  ceFEPime (MAXIPIME) IV 2 g (11/06/21 2330)    atorvastatin   10 mg Oral Daily   calcium carbonate  1,000 mg Oral TID WC   Chlorhexidine Gluconate Cloth  6 each Topical Q0600   docusate sodium  100 mg Oral BID   gabapentin  100 mg Oral BID   Gerhardt's butt cream   Topical TID   heparin  5,000 Units Subcutaneous Q12H   insulin aspart  0-5 Units Subcutaneous QHS   insulin aspart  0-6 Units Subcutaneous TID WC   isosorbide mononitrate  30 mg Oral Daily   metoprolol tartrate  25 mg Oral BID   metroNIDAZOLE  500 mg Oral Q12H   mouth rinse  15 mL Mouth Rinse 4 times per day   pantoprazole  40 mg Oral Daily   polyethylene glycol  17 g Oral Daily   prochlorperazine  5 mg Intravenous Once    Dialysis Orders: MWF - [place] 4hrs, BFR 400, DFR 600,  AVF, EDW 146kg, 3K/ 2Ca Heparin 2500 units with HD Hectorol 16mcg IV qHD-last 10/30/21     Assessment/Plan: ESRD - on HD MWF. HD tomorrow per regular schedule.  Hypertension/volume- BP variable, UF as able with HD.  Metoprolol 25mg  BID  started 11/04/21, titrate as needed.  Does not appear grossly volume overloaded. Anemia of CKD - last Hgb 9.0. Check iron studies.  Secondary Hyperparathyroidism - Ca and phos in goal. continue Hectorol. Not on binders.  Nutrition - Renal diet with fluid restriction Dispo-Very difficulty situation. Patient required 4 staff members and 2 hoyer pads for transfer into HD chair as OP. Not staying on HD for full treatment d/t pain for multiple decubitus ulcers. This is a major safety concern for staff and patient in the outpatient center. Also concern for inability for ulcers to heal properly d/t frequent and rigorous transfers that are required. Need to determine new placement at a facility where she can receive HD in the bed-more likley a LTAC if possible.   Decubs-  abx and local wound care Recent septic shock 2/2 bacteremia/liver abscess - on IV cefepime w/HD until 11/08/21  Jen Mow, PA-C [facility] 11/07/2021,8:47 AM  LOS: 6 days    Nephrology attending: I have personally seen and examined the patient.  Chart reviewed.  I agree with above. Tolerated dialysis well.  Difficult discharge situation discussed with renal navigator.  Awaiting to hear from [facility] for acceptance.  Next HD tomorrow.  On antibiotics.  Also difficult IV access.  She only needs antibiotics IV which she can get during dialysis.  Discussed with the primary team.  D.  Carolin Sicks, CKA

## 2021-11-07 NOTE — Progress Notes (Signed)
2 attempts by [facility] RN , unsuccessful. Veins too deep for our longest catheter or too small superficially due to edema. RN aware 2 I.V nurses have assessed.

## 2021-11-07 NOTE — Plan of Care (Signed)
  Problem: Education: Goal: Knowledge of the prescribed therapeutic regimen will improve Outcome: Progressing Goal: Ability to verbalize activity precautions or restrictions will improve Outcome: Progressing Goal: Understanding of discharge needs will improve Outcome: Completed/Met   Problem: Activity: Goal: Ability to perform//tolerate increased activity and mobilize with assistive devices will improve Outcome: Progressing   Problem: Clinical Measurements: Goal: Postoperative complications will be avoided or minimized Outcome: Progressing   Problem: Self-Care: Goal: Ability to meet self-care needs will improve Outcome: Progressing   Problem: Self-Concept: Goal: Ability to maintain and perform role responsibilities to the fullest extent possible will improve Outcome: Progressing   Problem: Pain Management: Goal: Pain level will decrease with appropriate interventions Outcome: Progressing   Problem: Skin Integrity: Goal: Demonstration of wound healing without infection will improve Outcome: Progressing   Problem: Education: Goal: Ability to describe self-care measures that may prevent or decrease complications (Diabetes Survival Skills Education) will improve Outcome: Progressing   Problem: Coping: Goal: Ability to adjust to condition or change in health will improve Outcome: Progressing   Problem: Fluid Volume: Goal: Ability to maintain a balanced intake and output will improve Outcome: Progressing   Problem: Health Behavior/Discharge Planning: Goal: Ability to identify and utilize available resources and services will improve Outcome: Progressing

## 2021-11-07 NOTE — Progress Notes (Signed)
Spoke to [employer] with [employer]. Pt's case is still being reviewed. Kim to contacted navigator with determination once one has been made.   Melven Sartorius Renal Navigator [phone]

## 2021-11-07 NOTE — Progress Notes (Signed)
Physical Therapy Treatment Patient Details Name: Courtney Gray MRN: [id number] DOB: 1970-05-08 Today's Date: 11/07/2021   History of Present Illness Pt adm 7/28 with acute pulmonary edema. 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    PT Comments    Pt asleep, wakes easily and agreeable to return to bed. Lift pad reattached to MaxiSky and lift started but pt with c/o of  increased L residual limb pad. Maxisky lowered and pad readjusted around L residual limb. Pt successfully returned to bed. Pt with complaints about uncomfortable [employer] bed. PT considered EasyWider bed replacement and although bed offers improved chair positioning, benefits of different bed limited given pt deficits. D/c plans remain appropriate at this time. PT will continue to follow acutely.    Recommendations for follow up therapy are one component of a multi-disciplinary discharge planning process, led by the attending physician.  Recommendations may be updated based on patient status, additional functional criteria and insurance authorization.  Follow Up Recommendations  PT at Long-term acute care hospital Can patient physically be transported by private vehicle: No   Assistance Recommended at Discharge Frequent or constant Supervision/Assistance  Patient can return home with the following Two people to help with walking and/or transfers;Two people to help with bathing/dressing/bathroom;Assist for transportation   Equipment Recommendations  Wheelchair (measurements PT);Wheelchair cushion (measurements PT);Other (comment);Hospital bed (Needs wide w/c with cushion)       Precautions / Restrictions Precautions Precautions: Fall Precaution Comments: bil AKA, 3L O2 at baseline, LUQ drain Restrictions Weight Bearing Restrictions: Yes RLE Weight Bearing: Non weight bearing LLE Weight Bearing: Non weight bearing      Mobility  Bed Mobility Overal bed mobility: Needs Assistance Bed Mobility: Supine to Sit, Sit to Supine, Rolling Rolling: Max assist         General bed mobility comments: maxAx2 for rolling for pad removal    Transfers Overall transfer level: Needs assistance   Transfers: Bed to chair/wheelchair/BSC             General transfer comment: watch placement of pad around and under L residual limb Transfer via Lift Equipment: Maxisky (with amputee lift pad)        Balance Overall balance assessment: Needs assistance Sitting-balance support: No upper extremity supported Sitting balance-[name] Scale: Good Sitting balance - Comments: with long sitting in bed                                    Cognition Arousal/Alertness: Awake/alert Behavior During Therapy: WFL for tasks assessed/performed Overall Cognitive Status: Within Functional Limits for tasks assessed                                 General Comments: pt with eyes closed intermittently, but awake, very slowed in response so questions           General Comments General comments (skin integrity, edema, etc.): VSS on 4L O2 via [place]      Pertinent Vitals/Pain Pain Assessment Pain Assessment: Faces Faces Pain Scale: Hurts a little bit Pain Location: LLE Pain Descriptors / Indicators: Guarding, Grimacing Pain Intervention(s): Limited activity within patient's tolerance, Monitored during session, Repositioned     PT Goals (current goals can now be found in the care plan section) Acute Rehab PT  Goals Patient Stated Goal: Pt wants to get up into a w/c PT Goal Formulation: With patient Time For Goal Achievement: 11/17/21 Potential to Achieve Goals: Fair Progress towards PT goals: Progressing toward goals    Frequency    Min 2X/week      PT Plan Current plan remains appropriate       AM-PAC PT "6 Clicks" Mobility   Outcome Measure  Help needed turning from your back to  your side while in a flat bed without using bedrails?: A Little Help needed moving from lying on your back to sitting on the side of a flat bed without using bedrails?: Total Help needed moving to and from a bed to a chair (including a wheelchair)?: Total Help needed standing up from a chair using your arms (e.g., wheelchair or bedside chair)?: Total Help needed to walk in hospital room?: Total Help needed climbing 3-5 steps with a railing? : Total 6 Click Score: 8    End of Session Equipment Utilized During Treatment: Oxygen Activity Tolerance: Patient tolerated treatment well Patient left: with call bell/phone within reach;in chair;with chair alarm set Nurse Communication: Other (comment) (Requested to charge nurse pt be moved to a room with a maxisky lift when a room with one becomes available) PT Visit Diagnosis: Other abnormalities of gait and mobility (R26.89);Muscle weakness (generalized) (M62.81) Pain - Right/Left: Left Pain - part of body:  (residual limb)     Time: 6063-0160 PT Time Calculation (min) (ACUTE ONLY): 29 min  Charges:  $Therapeutic Activity: 23-37 mins                     Courtney Gray B. Migdalia Dk PT, DPT Acute Rehabilitation Services Please use secure chat or  Call Office [phone]    [place] 11/07/2021, 2:35 PM

## 2021-11-07 NOTE — Plan of Care (Signed)
  Problem: Pain Management: Goal: Pain level will decrease with appropriate interventions Outcome: Progressing   Problem: Nutritional: Goal: Maintenance of adequate nutrition will improve Outcome: Progressing   Problem: Coping: Goal: Level of anxiety will decrease Outcome: Progressing   Problem: Elimination: Goal: Will not experience complications related to bowel motility Outcome: Not Progressing

## 2021-11-07 NOTE — Progress Notes (Signed)
Physical Therapy Treatment Patient Details Name: Courtney Gray MRN: [id number] DOB: Aug 18, 1970 Today's Date: 11/07/2021   History of Present Illness Pt adm 7/28 with acute pulmonary edema. 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    PT Comments    Bariatric Recliner and Amputee lift pad procured for pt. Pt requiring maxAx2 for rolling L & R fully onto side for placement of lift pad. Chair set up with 2 Geomats and pt educated in movement to help off weight sacrum during sitting up in chair. Checked on pt after 1 hour and pt requests to stay up an additional hour.     Recommendations for follow up therapy are one component of a multi-disciplinary discharge planning process, led by the attending physician.  Recommendations may be updated based on patient status, additional functional criteria and insurance authorization.  Follow Up Recommendations  PT at Long-term acute care hospital Can patient physically be transported by private vehicle: No   Assistance Recommended at Discharge Frequent or constant Supervision/Assistance  Patient can return home with the following Two people to help with walking and/or transfers;Two people to help with bathing/dressing/bathroom;Assist for transportation   Equipment Recommendations  Wheelchair (measurements PT);Wheelchair cushion (measurements PT);Other (comment);Hospital bed (Needs wide w/c with cushion)    Recommendations for Other Services       Precautions / Restrictions Precautions Precautions: Fall Precaution Comments: bil AKA, 3L O2 at baseline, LUQ drain Restrictions Weight Bearing Restrictions: Yes RLE Weight Bearing: Non weight bearing LLE Weight Bearing: Non weight bearing     Mobility  Bed Mobility Overal bed mobility: Needs Assistance Bed Mobility: Supine to Sit, Sit to Supine, Rolling Rolling: Max assist         General bed mobility  comments: maxAx2 for rolling for pad placement    Transfers Overall transfer level: Needs assistance   Transfers: Bed to chair/wheelchair/BSC               Transfer via Lift Equipment: Maxisky (with amputee lift pad)        Balance Overall balance assessment: Needs assistance Sitting-balance support: No upper extremity supported Sitting balance-[name] Scale: Good Sitting balance - Comments: with long sitting in bed                                    Cognition Arousal/Alertness: Awake/alert Behavior During Therapy: WFL for tasks assessed/performed Overall Cognitive Status: Within Functional Limits for tasks assessed                                 General Comments: pt with eyes closed intermittently, but awake, very slowed in response so questions           General Comments General comments (skin integrity, edema, etc.): VSS on 4L O2 via [place]      Pertinent Vitals/Pain Pain Assessment Pain Assessment: Faces Faces Pain Scale: Hurts a little bit Pain Location: LLE Pain Descriptors / Indicators: Guarding, Grimacing Pain Intervention(s): Limited activity within patient's tolerance, Monitored during session, Repositioned     PT Goals (current goals can now be found in the care plan section) Acute Rehab PT Goals Patient Stated Goal: Pt wants to get up into a w/c PT Goal Formulation: With patient Time For Goal Achievement: 11/17/21 Potential to Achieve Goals:  Fair Progress towards PT goals: Progressing toward goals    Frequency    Min 2X/week      PT Plan Current plan remains appropriate       AM-PAC PT "6 Clicks" Mobility   Outcome Measure  Help needed turning from your back to your side while in a flat bed without using bedrails?: A Little Help needed moving from lying on your back to sitting on the side of a flat bed without using bedrails?: Total Help needed moving to and from a bed to a chair (including a wheelchair)?:  Total Help needed standing up from a chair using your arms (e.g., wheelchair or bedside chair)?: Total Help needed to walk in hospital room?: Total Help needed climbing 3-5 steps with a railing? : Total 6 Click Score: 8    End of Session Equipment Utilized During Treatment: Oxygen Activity Tolerance: Patient tolerated treatment well Patient left: with call bell/phone within reach;in chair;with chair alarm set Nurse Communication: Other (comment) (Requested to charge nurse pt be moved to a room with a maxisky lift when a room with one becomes available) PT Visit Diagnosis: Other abnormalities of gait and mobility (R26.89);Muscle weakness (generalized) (M62.81) Pain - Right/Left: Left Pain - part of body:  (residual limb)     Time: 0973-5329 PT Time Calculation (min) (ACUTE ONLY): 37 min  Charges:  $Therapeutic Activity: 23-37 mins                     Roxanne Orner B. Migdalia Dk PT, DPT Acute Rehabilitation Services Please use secure chat or  Call Office [phone]    [place] 11/07/2021, 2:32 PM

## 2021-11-07 NOTE — Progress Notes (Signed)
Patient refused to let lab draw blood provider aware

## 2021-11-08 DIAGNOSIS — N186 End stage renal disease: Secondary | ICD-10-CM | POA: Diagnosis not present

## 2021-11-08 LAB — RENAL FUNCTION PANEL
Albumin: 2 g/dL — ABNORMAL LOW (ref 3.5–5.0)
Anion gap: 9 (ref 5–15)
BUN: 18 mg/dL (ref 6–20)
CO2: 29 mmol/L (ref 22–32)
Calcium: 8.5 mg/dL — ABNORMAL LOW (ref 8.9–10.3)
Chloride: 97 mmol/L — ABNORMAL LOW (ref 98–111)
Creatinine, Ser: 4.9 mg/dL — ABNORMAL HIGH (ref 0.44–1.00)
GFR, Estimated: 10 mL/min — ABNORMAL LOW (ref >60)
Glucose, Bld: 105 mg/dL — ABNORMAL HIGH (ref 70–99)
Phosphorus: 3.5 mg/dL (ref 2.5–4.6)
Potassium: 3.5 mmol/L (ref 3.5–5.1)
Sodium: 135 mmol/L (ref 135–145)

## 2021-11-08 LAB — CBC
HCT: 29 % — ABNORMAL LOW (ref 36.0–46.0)
Hemoglobin: 8.4 g/dL — ABNORMAL LOW (ref 12.0–15.0)
MCH: 27.9 pg (ref 26.0–34.0)
MCHC: 29 g/dL — ABNORMAL LOW (ref 30.0–36.0)
MCV: 96.3 fL (ref 80.0–100.0)
Platelets: 226 10*3/uL (ref 150–400)
RBC: 3.01 MIL/uL — ABNORMAL LOW (ref 3.87–5.11)
RDW: 17.2 % — ABNORMAL HIGH (ref 11.5–15.5)
WBC: 10 10*3/uL (ref 4.0–10.5)
nRBC: 0 % (ref 0.0–0.2)

## 2021-11-08 LAB — GLUCOSE, CAPILLARY
Glucose-Capillary: 75 mg/dL (ref 70–99)
Glucose-Capillary: 107 mg/dL — ABNORMAL HIGH (ref 70–99)
Glucose-Capillary: 85 mg/dL (ref 70–99)

## 2021-11-08 MED ORDER — LIDOCAINE-PRILOCAINE 2.5-2.5 % EX CREA
1.0000 | TOPICAL_CREAM | CUTANEOUS | Status: DC | PRN
Start: 2021-11-08 — End: 2021-11-08

## 2021-11-08 MED ORDER — NEPRO/CARBSTEADY PO LIQD
237.0000 mL | Freq: Two times a day (BID) | ORAL | Status: DC
  Administered 2021-11-09 – 2021-11-15 (×8): 237 mL via ORAL

## 2021-11-08 MED ORDER — HEPARIN SODIUM (PORCINE) 1000 UNIT/ML DIALYSIS
2500.0000 [IU] | INTRAMUSCULAR | Status: DC | PRN
Start: 2021-11-08 — End: 2021-11-08

## 2021-11-08 MED ORDER — LACTULOSE 10 GM/15ML PO SOLN
30.0000 g | Freq: Once | ORAL | Status: AC
  Administered 2021-11-08: 30 g via ORAL
  Filled 2021-11-08: qty 45

## 2021-11-08 MED ORDER — DARBEPOETIN ALFA 100 MCG/0.5ML IJ SOSY
PREFILLED_SYRINGE | INTRAMUSCULAR | Status: AC
  Administered 2021-11-08: 100 ug
  Filled 2021-11-08: qty 0.5

## 2021-11-08 MED ORDER — LIDOCAINE HCL (PF) 1 % IJ SOLN: 5.0000 mL | INTRAMUSCULAR | Status: DC | PRN

## 2021-11-08 MED ORDER — DARBEPOETIN ALFA 100 MCG/0.5ML IJ SOSY
100.0000 ug | PREFILLED_SYRINGE | INTRAMUSCULAR | Status: DC
  Administered 2021-11-15 – 2021-11-22 (×2): 100 ug via INTRAVENOUS
  Filled 2021-11-08 (×5): qty 0.5

## 2021-11-08 MED ORDER — ALTEPLASE 2 MG IJ SOLR: 2.0000 mg | Freq: Once | INTRAMUSCULAR | Status: DC | PRN

## 2021-11-08 MED ORDER — HEPARIN SODIUM (PORCINE) 1000 UNIT/ML DIALYSIS
1000.0000 [IU] | INTRAMUSCULAR | Status: DC | PRN
  Administered 2021-11-08: 1000 [IU]

## 2021-11-08 MED ORDER — CALCIUM CARBONATE ANTACID 500 MG PO CHEW
200.0000 mg | CHEWABLE_TABLET | Freq: Three times a day (TID) | ORAL | Status: DC
  Administered 2021-11-12: 200 mg via ORAL
  Filled 2021-11-08 (×10): qty 1

## 2021-11-08 MED ORDER — PENTAFLUOROPROP-TETRAFLUOROETH EX AERO
1.0000 | INHALATION_SPRAY | CUTANEOUS | Status: DC | PRN
Start: 2021-11-08 — End: 2021-11-08

## 2021-11-08 MED ORDER — ANTICOAGULANT SODIUM CITRATE 4% (200MG/5ML) IV SOLN
5.0000 mL | Status: DC | PRN
  Filled 2021-11-08: qty 5

## 2021-11-08 NOTE — TOC Initial Note (Signed)
Transition of Care [facility]) - Initial/Assessment Note    Patient Details  Name: Courtney Gray MRN: [id number] Date of Birth: 11/15/1970  Transition of Care [facility]) CM/SW Contact:    Milinda Antis, [place] Phone Number: 11/08/2021, 4:26 PM  Clinical Narrative:                 CSW met with the patient at bedside and explained what is needed for the patient to discharge to a SNF. The patient reports that she does not want to be in a facility outside of [place].  Patient expressed that she wants to be close to her daughter.  CSW explained that before a SNF can be located, an outpatient dialysis clinic that can accept a patient needed for stretcher dialysis needs to be solidified and that CSW will look in the [place] area, but placement in [place] will not be guaranteed.  Patient was understanding and appreciative of the update.    TOC will continue to follow.    Expected Discharge Plan: Long Term Nursing Home Barriers to Discharge: No SNF bed, Transportation, Waiting for outpatient dialysis   Patient Goals and CMS Choice Patient states their goals for this hospitalization and ongoing recovery are:: To be at a facility close to her daughter      Expected Discharge Plan and Services Expected Discharge Plan: [place]       Living arrangements for the past 2 months: Single Family Home                                      Prior Living Arrangements/Services Living arrangements for the past 2 months: Single Family Home Lives with:: Adult Children Patient language and need for interpreter reviewed:: Yes        Need for Family Participation in Patient Care: Yes (Comment) Care giver support system in place?: No (comment)   Criminal Activity/Legal Involvement Pertinent to Current Situation/Hospitalization: No - Comment as needed  Activities of Daily Living Home Assistive Devices/Equipment: [employer] fast streamer ADL Screening (condition at time of admission) Patient's  cognitive ability adequate to safely complete daily activities?: No Is the patient deaf or have difficulty hearing?: No Does the patient have difficulty seeing, even when wearing glasses/contacts?: No Does the patient have difficulty concentrating, remembering, or making decisions?: No Patient able to express need for assistance with ADLs?: Yes Does the patient have difficulty dressing or bathing?: Yes Independently performs ADLs?: No Does the patient have difficulty walking or climbing stairs?: Yes Weakness of Legs: Both Weakness of Arms/Hands: Both  Permission Sought/Granted Permission sought to share information with : Other (comment) Permission granted to share information with : Yes, Verbal Permission Granted  Share Information with NAME: Crumm,antoinette (Daughter)   [phone]  Permission granted to share info w AGENCY: SNF        Emotional Assessment Appearance:: Appears older than stated age Attitude/Demeanor/Rapport: Engaged Affect (typically observed): Tearful/Crying, Accepting Orientation: : Oriented to Situation, Oriented to  Time, Oriented to Place, Oriented to Self Alcohol / Substance Use: Not Applicable Psych Involvement: No (comment)  Admission diagnosis:  End stage renal disease ([place]) [N18.6] Fluid overload [E87.70] Obesity with serious comorbidity, unspecified classification, unspecified obesity type [E66.9] Pressure injury of right buttock, stage 2 ([place]) [L89.312] Patient Active Problem List   Diagnosis Date Noted   Fluid overload 11/01/2021   Hepatic abscess    Pressure injury of skin 09/13/2021   PVD (  peripheral vascular disease) (HCC)    Hx of AKA (above knee amputation), right ([place])    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   Lactic acidosis 09/10/2021   Mixed diabetic hyperlipidemia associated with type 2 diabetes mellitus ([place]) 09/10/2021   Coronary artery disease involving native coronary artery of native heart without angina pectoris 09/10/2021    Elevated troponin level not due myocardial infarction 09/10/2021   Hypotension 09/10/2021   Dry gangrene (HCC)    Pain 08/03/2021   Ischemic foot pain at rest 08/03/2021   Hypoglycemia 07/10/2021   Volume overload 07/09/2021   Acute pulmonary edema (HCC)    Leukocytosis 06/11/2021   Acute gout 06/11/2021   Hyperkalemia 05/21/2021   Hemorrhoids    Hyperlipidemia 05/18/2021   Rectal bleeding 05/17/2021   Morbid obesity with BMI of 60.0-69.9, adult ([place]) 05/17/2021   NSTEMI (non-ST elevated myocardial infarction) ([place]) 05/16/2021   Essential hypertension 09/01/2020   End stage renal disease ([place]) 09/01/2020   Chronic respiratory failure with hypoxia ([place]) 09/01/2020   Hypertensive urgency 11/28/2019   Uncontrolled type 2 diabetes mellitus with hypoglycemia, with long-term current use of insulin ([place]) 11/28/2019   Anemia of chronic disease 11/28/2019   PCP:  Monico Blitz, MD Pharmacy:   [place] [address] [place] [address] Phone: [phone] Fax: [phone]  [facility] [address] [place] [address] Phone: [phone] Fax: [phone]  [place], [place] - [address] [place] [address] Phone: [phone] Fax: [phone]     Social Determinants of Health ([place]) Interventions    Readmission Risk Interventions    09/16/2021    4:36 PM 08/28/2021   11:50 AM 07/12/2021   12:38 PM  Readmission Risk Prevention Plan  Transportation Screening Complete Complete Complete  Medication Review (RN Care Manager)  Complete Complete  PCP or Specialist appointment within 3-5 days of discharge  Complete Complete  HRI or Home Care Consult Complete Complete Complete  SW Recovery Care/Counseling Consult Complete Complete Complete  Palliative Care Screening  Not Applicable Not Applicable  Skilled Nursing Facility Complete Not Applicable  Complete

## 2021-11-08 NOTE — Progress Notes (Signed)
PT Cancellation Note  Patient Details Name: Courtney Gray MRN: [id number] DOB: Aug 14, 1970   Cancelled Treatment:    Reason Eval/Treat Not Completed: Patient at procedure or test/unavailable (HD). PT will continue to f/u with pt acutely as available and appropriate.    [place] 11/08/2021, 7:44 AM

## 2021-11-08 NOTE — Progress Notes (Signed)
PROGRESS NOTE    Courtney Gray  FYB:[id number] DOB: [birth year] DOA: 11/01/2021 PCP: Monico Blitz, MD   Brief Narrative:  51 y.o. female with medical history significant of ESRD on HD TTS, recent bacteremia and hepatic abscess on long-term IV antibiotics, chronic HF PEF, PVD status post bilateral AKA, IIDM, morbid obesity, chronic sacral wound stage II, presented with increasing shortness of breath.   Patient underwent bilateral AKA recently including right AKA in May and left AKA on 7/5.  And patient was recently diagnosed with septic shock secondary to bacteremia and liver abscess, for which patient has been on cefepime and Flagyl and the last dose will be August 4.  Recently, since patient has bilateral AKA, she has had extreme difficulty to get access to outpatient HD center.  She never missed her dialysis however according to outpatient HD records, it takes at least 4 miles stop and [facility] lift for patient to be seated in the dialysis chair.  Starting today, patient started to feel shortness of breath, no chest pain no swelling.  No cough no fever or chills. She is now awaiting placement for HD that takes stretcher and is hoping to find one locally.    Assessment & Plan:   Active Problems:   End stage renal disease (HCC)   Acute pulmonary edema (HCC)   Fluid overload  Assessment and Plan:  Acute on chronic HFpEF with chronic hypoxemic respiratory failure -Secondary to uncontrolled hypertension, clinical has signs of mild fluid overload at presentation .Imdur was started at presentation Volume status improved with HD     HTN emergency -BP seems improved   ESRD on HD -Admitting physician discussed with ED physician and case manager, likely patient will need long-term dialysis plan as currently, increasingly, outpatient HD center high hard time accommodate patient's dialysis.  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 On recent admit, developed septic shock from bacteroides thetaiotaomicron bacteremia with septic emboli to spleen, initially on anticoagulation which was later held as splenic infarct was determined to be infectious and not blood clot -Pt receiving IV cefepime on HD and flagyl, to end today, 8/4   PAD -Pt s/p B AKA for gangrene with plan to f/u with vascular surgery in 1 month -Pt was s/p L middle finger amputation for gangrene at recent admit      DVT prophylaxis:Heparin Code Status: Full Family Communication: Discussed with daughter at bedside 8/4 Disposition Plan:  Status is: Inpatient Remains inpatient appropriate because: IV medications     Consultants:  Nephrology   Procedures:  None    Antimicrobials:  Anti-infectives (From admission, onward)    Start     Dose/Rate Route Frequency Ordered Stop   11/04/21 1800  ceFEPIme (MAXIPIME) 2 g in sodium chloride 0.9 % 100 mL IVPB        2 g 200 mL/hr over 30 Minutes Intravenous Every M-W-F (1800) 11/03/21 5277 11/08/21 2359   11/04/21 1200  ceFEPIme (MAXIPIME) 2 g in sodium  chloride 0.9 % 100 mL IVPB  Status:  Discontinued        2 g 200 mL/hr over 30 Minutes Intravenous Every M-W-F (Hemodialysis) 11/02/21 0941 11/03/21 0952   11/02/21 1800  ceFEPIme (MAXIPIME) 2 g in sodium chloride 0.9 % 100 mL IVPB        2 g 200 mL/hr over 30 Minutes Intravenous  Once 11/02/21 0941 11/02/21 1812   11/02/21 1200  ceFEPIme  (MAXIPIME) 2 g in sodium chloride 0.9 % 100 mL IVPB  Status:  Discontinued        2 g 200 mL/hr over 30 Minutes Intravenous Every T-Th-Sa (Hemodialysis) 11/01/21 1934 11/02/21 0941   11/01/21 2200  metroNIDAZOLE (FLAGYL) tablet 500 mg        500 mg Oral Every 12 hours 11/01/21 1827 11/08/21 2200   11/01/21 1945  ceFEPIme (MAXIPIME) 1 g in sodium chloride 0.9 % 100 mL IVPB        1 g 200 mL/hr over 30 Minutes Intravenous  Once 11/01/21 1933 11/01/21 2032   11/01/21 1730  ceFEPIme (MAXIPIME) 1 g in sodium chloride 0.9 % 100 mL IVPB  Status:  Discontinued        1 g 200 mL/hr over 30 Minutes Intravenous Every 24 hours 11/01/21 1720 11/01/21 1933         Subjective: Patient seen and evaluated today and denies any recent bowel movements and continues to have some issues with nausea and vomiting.  Seen during hemodialysis today.  Objective: Vitals:   11/08/21 1030 11/08/21 1100 11/08/21 1130 11/08/21 1155  BP: (!) 151/72 (!) 162/71 (!) 164/84 (!) 164/87  Pulse: 73 79 75 75  Resp: 14 20 15 20   Temp:    99 F (37.2 C)  TempSrc:    Oral  SpO2: 100% 100% 100% 100%  Weight:    (!) 178 kg  Height:        Intake/Output Summary (Last 24 hours) at 11/08/2021 1632 Last data filed at 11/08/2021 1540 Gross per 24 hour  Intake 340 ml  Output 3000 ml  Net -2660 ml   Filed Weights   11/06/21 2205 11/08/21 0800 11/08/21 1155  Weight: (!) 170 kg (!) 181 kg (!) 178 kg    Examination:  General exam: Appears calm and comfortable, obese Respiratory system: Clear to auscultation. Respiratory effort normal.  Nasal cannula Cardiovascular system: S1 & S2 heard, RRR.  Gastrointestinal system: Abdomen is soft Central nervous system: Alert and awake Extremities: Bilateral AKA, 1+ edema Skin: No significant lesions noted Psychiatry: Flat affect.    Data Reviewed: I have personally reviewed following labs and imaging studies  CBC: Recent Labs  Lab 11/02/21 0302 11/06/21 1805 11/08/21 0759   WBC 14.0* 10.3 10.0  HGB 9.7* 9.0* 8.4*  HCT 30.1* 29.0* 29.0*  MCV 92.9 95.4 96.3  PLT 261 234 371   Basic Metabolic Panel: Recent Labs  Lab 11/01/21 1740 11/02/21 0302 11/04/21 0250 11/06/21 0403 11/08/21 0758  NA 129* 131* 134* 133* 135  K 5.0 3.6 3.5 5.2* 3.5  CL 93* 94* 99 99 97*  CO2 23 28 25 22 29   GLUCOSE 140* 180* 109* 82 105*  BUN 28* 30* 24* 24* 18  CREATININE 5.71* 6.19* 5.56* 5.32* 4.90*  CALCIUM 8.0* 8.1* 8.5* 8.8* 8.5*  MG  --   --  1.8  --   --   PHOS  --   --  2.8  --  3.5   GFR: Estimated Creatinine Clearance: 22.6  mL/min (A) (by C-G formula based on SCr of 4.9 mg/dL (H)). Liver Function Tests: Recent Labs  Lab 11/01/21 1740 11/06/21 0403 11/08/21 0758  AST 33 28  --   ALT 8 7  --   ALKPHOS 72 62  --   BILITOT 1.5* 0.9  --   PROT 6.6 6.9  --   ALBUMIN 2.2* 2.2* 2.0*   No results for input(s): "LIPASE", "AMYLASE" in the last 168 hours. No results for input(s): "AMMONIA" in the last 168 hours. Coagulation Profile: No results for input(s): "INR", "PROTIME" in the last 168 hours. Cardiac Enzymes: No results for input(s): "CKTOTAL", "CKMB", "CKMBINDEX", "TROPONINI" in the last 168 hours. BNP (last 3 results) No results for input(s): "PROBNP" in the last 8760 hours. HbA1C: No results for input(s): "HGBA1C" in the last 72 hours. CBG: Recent Labs  Lab 11/07/21 0742 11/07/21 1114 11/07/21 1626 11/07/21 2006 11/08/21 1323  GLUCAP 108* 109* 115* 110* 75   Lipid Profile: No results for input(s): "CHOL", "HDL", "LDLCALC", "TRIG", "CHOLHDL", "LDLDIRECT" in the last 72 hours. Thyroid Function Tests: No results for input(s): "TSH", "T4TOTAL", "FREET4", "T3FREE", "THYROIDAB" in the last 72 hours. Anemia Panel: No results for input(s): "VITAMINB12", "FOLATE", "FERRITIN", "TIBC", "IRON", "RETICCTPCT" in the last 72 hours. Sepsis Labs: No results for input(s): "PROCALCITON", "LATICACIDVEN" in the last 168 hours.  No results found for this or any  previous visit (from the past 240 hour(s)).       Radiology Studies: No results found.      Scheduled Meds:  atorvastatin  10 mg Oral Daily   bisacodyl  10 mg Rectal Once   calcium carbonate  200 mg of elemental calcium Oral TID WC   darbepoetin (ARANESP) injection - DIALYSIS  100 mcg Intravenous Q Fri-HD   docusate sodium  100 mg Oral BID   feeding supplement (NEPRO CARB STEADY)  237 mL Oral BID BM   gabapentin  100 mg Oral BID   Gerhardt's butt cream   Topical TID   heparin  5,000 Units Subcutaneous Q12H   insulin aspart  0-5 Units Subcutaneous QHS   insulin aspart  0-6 Units Subcutaneous TID WC   isosorbide mononitrate  30 mg Oral Daily   metoprolol tartrate  25 mg Oral BID   metroNIDAZOLE  500 mg Oral Q12H   mouth rinse  15 mL Mouth Rinse 4 times per day   pantoprazole  40 mg Oral Daily   polyethylene glycol  17 g Oral Daily   prochlorperazine  5 mg Intravenous Once   sorbitol, milk of mag, mineral oil, glycerin (SMOG) enema  960 mL Rectal Once   Continuous Infusions:  ceFEPime (MAXIPIME) IV 2 g (11/06/21 2330)     LOS: 7 days    Time spent: 35 minutes    Aedin Jeansonne Darleen Crocker, DO [facility]  If 7PM-7AM, please contact night-coverage [email or web address] 11/08/2021, 4:32 PM

## 2021-11-08 NOTE — Progress Notes (Addendum)
[facility] Progress Note   Subjective:  Seen on HD - 3L UFG and tolerating. No dyspnea/CP. C/o constipation and says will ask for something when she gets back to her room.  Objective Vitals:   11/07/21 2004 11/08/21 0457 11/08/21 0720 11/08/21 0800  BP: (!) 145/91 136/80 (!) 148/82 (!) 149/69  Pulse: 81 85 86 82  Resp: 20 18 (!) 22 (!) 25  Temp: 99.3 F (37.4 C) 98.1 F (36.7 C) 98.9 F (37.2 C)   TempSrc: Oral Oral Oral   SpO2: 100% 100% 100% 100%  Weight:    (!) 181 kg  Height:       Physical Exam General: Chronically ill appearing woman, obese. Nasal O2 in place. Heart: RRR; no murmur Lungs: CTAB; reduced air movement throughout Abdomen: soft Extremities: bilateral AKA; 1+ LUE edema Dialysis Access: [facility]  Additional Objective Labs: Basic Metabolic Panel: Recent Labs  Lab 11/02/21 0302 11/04/21 0250 11/06/21 0403  NA 131* 134* 133*  K 3.6 3.5 5.2*  CL 94* 99 99  CO2 28 25 22   GLUCOSE 180* 109* 82  BUN 30* 24* 24*  CREATININE 6.19* 5.56* 5.32*  CALCIUM 8.1* 8.5* 8.8*  PHOS  --  2.8  --    Liver Function Tests: Recent Labs  Lab 11/01/21 1740 11/06/21 0403  AST 33 28  ALT 8 7  ALKPHOS 72 62  BILITOT 1.5* 0.9  PROT 6.6 6.9  ALBUMIN 2.2* 2.2*   CBC: Recent Labs  Lab 11/01/21 1336 11/02/21 0302 11/06/21 1805 11/08/21 0759  WBC 7.3 14.0* 10.3 10.0  NEUTROABS 5.7  --   --   --   HGB 15.1* 9.7* 9.0* 8.4*  HCT 49.9* 30.1* 29.0* 29.0*  MCV 94.7 92.9 95.4 96.3  PLT 152 261 234 226   Medications:  anticoagulant sodium citrate     ceFEPime (MAXIPIME) IV 2 g (11/06/21 2330)    atorvastatin  10 mg Oral Daily   bisacodyl  10 mg Rectal Once   calcium carbonate  1,000 mg Oral TID WC   docusate sodium  100 mg Oral BID   gabapentin  100 mg Oral BID   Gerhardt's butt cream   Topical TID   heparin  5,000 Units Subcutaneous Q12H   insulin aspart  0-5 Units Subcutaneous QHS   insulin aspart  0-6 Units Subcutaneous TID WC   isosorbide  mononitrate  30 mg Oral Daily   metoprolol tartrate  25 mg Oral BID   metroNIDAZOLE  500 mg Oral Q12H   mouth rinse  15 mL Mouth Rinse 4 times per day   pantoprazole  40 mg Oral Daily   polyethylene glycol  17 g Oral Daily   prochlorperazine  5 mg Intravenous Once   sorbitol, milk of mag, mineral oil, glycerin (SMOG) enema  960 mL Rectal Once    Dialysis Orders: MWF - [place] 4hrs, BFR 400, DFR 600,  AVF, EDW 146kg, 3K/ 2Ca Heparin 2500 units with HD Hectorol 66mcg IV qHD, held as of 10/30/21     Assessment/Plan: ESRD - on HD MWF. HD today - 3L UFG. Hypertension/volume - BP variable, UF as able with HD.  Metoprolol 25mg  BID started 11/04/21. Last CXR with B effusions/pulm edema. Anemia of CKD - Hgb down to 8.4, checking iron studies. Secondary Hyperparathyroidism - Ca/Phos to goal. Hectorol on hold, getting Tums 1000mg  TID as binder -> reduce dose to 500mg  TID.  Nutrition - Renal diet, adding supplements. Alb very low. Dispo - Very  difficulty situation. Patient required 4 staff members and 2 hoyer pads for transfer into HD chair as OP. Not staying on HD for full treatment d/t pain for multiple decubitus ulcers. This is a major safety concern for staff and patient in the outpatient center. Also concern for inability for ulcers to heal properly d/t frequent and rigorous transfers that are required. Need to determine new placement at a facility where she can receive HD in the bed-more likley a LTAC if possible.   Decubs-  abx and local wound care Recent septic shock 2/2 bacteremia/liver abscess - finishing course of IV cefepime today (11/08/21)    [name], PA-C 11/08/2021, 8:22 AM  [place]  Nephrology attending: Seen and examined in HD unit. Agree with above.  Awaiting to hear from [facility] for discharge plan. Order a dose of Aranesp today. Tolerating HD well. No new event.   Katheran James,  CKA

## 2021-11-09 DIAGNOSIS — E1169 Type 2 diabetes mellitus with other specified complication: Secondary | ICD-10-CM | POA: Diagnosis present

## 2021-11-09 DIAGNOSIS — I10 Essential (primary) hypertension: Secondary | ICD-10-CM | POA: Diagnosis present

## 2021-11-09 DIAGNOSIS — R7881 Bacteremia: Secondary | ICD-10-CM | POA: Diagnosis present

## 2021-11-09 DIAGNOSIS — I159 Secondary hypertension, unspecified: Secondary | ICD-10-CM | POA: Diagnosis not present

## 2021-11-09 DIAGNOSIS — N186 End stage renal disease: Secondary | ICD-10-CM | POA: Diagnosis not present

## 2021-11-09 DIAGNOSIS — L89152 Pressure ulcer of sacral region, stage 2: Secondary | ICD-10-CM | POA: Diagnosis present

## 2021-11-09 DIAGNOSIS — L89153 Pressure ulcer of sacral region, stage 3: Secondary | ICD-10-CM | POA: Diagnosis present

## 2021-11-09 DIAGNOSIS — E785 Hyperlipidemia, unspecified: Secondary | ICD-10-CM | POA: Diagnosis present

## 2021-11-09 DIAGNOSIS — I739 Peripheral vascular disease, unspecified: Secondary | ICD-10-CM | POA: Diagnosis present

## 2021-11-09 DIAGNOSIS — E119 Type 2 diabetes mellitus without complications: Secondary | ICD-10-CM | POA: Diagnosis present

## 2021-11-09 LAB — IRON AND TIBC
Iron: 55 ug/dL (ref 28–170)
Saturation Ratios: UNDETERMINED % (ref 10.4–31.8)
TIBC: UNDETERMINED ug/dL (ref 250–450)
UIBC: UNDETERMINED ug/dL

## 2021-11-09 LAB — GLUCOSE, CAPILLARY
Glucose-Capillary: 141 mg/dL — ABNORMAL HIGH (ref 70–99)
Glucose-Capillary: 91 mg/dL (ref 70–99)
Glucose-Capillary: 144 mg/dL — ABNORMAL HIGH (ref 70–99)
Glucose-Capillary: 116 mg/dL — ABNORMAL HIGH (ref 70–99)

## 2021-11-09 MED ORDER — SODIUM CHLORIDE 0.9 % IV SOLN
125.0000 mg | INTRAVENOUS | Status: DC
  Administered 2021-11-13 – 2021-12-02 (×3): 125 mg via INTRAVENOUS
  Filled 2021-11-09 (×5): qty 10

## 2021-11-09 NOTE — Assessment & Plan Note (Addendum)
Patient will continue renal replacement therapy per nephrology recommendations.  Needs placement to continue with HD as outpatient.  Very deconditioned, large sacral decubitus wound.   Continue renal replacement therapy per nephrology recommendations.   Anemia of chronic kidney disease, Continue with EPO and Iron supplementation.  Follow up hgb down to 6,9, ordered PRBC transfusion to be given tomorrow during HD.   Metabolic bone disease, continue with sevelamer.   Hyperkalemia, hypomagnesemia, hypokalemia, hyponatremia, hypophosphatemia have been corrected with renal replacement therapy.   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 Empiric treatment with thiosulfate 11/18. Discontinued due to persistent nausea and vomiting.  Patient complains of pain at the subcutaneous nodules  Continue pain control.  She has been able to use electric scooter yesterday with good toleration.

## 2021-11-09 NOTE — Assessment & Plan Note (Addendum)
Her glucose has been stable and insulin therapy has been discontinued.   Statin therapy with atorvastatin.

## 2021-11-09 NOTE — Assessment & Plan Note (Addendum)
Deep wound at the sacrum, patient not able to seat.   Continue with oral oxycodone dose has been decreased to 2,5 mg  Continue with local wound care. Continue with gabapentin. Will add duloxetine for chronic pain syndrome.

## 2021-11-09 NOTE — Assessment & Plan Note (Addendum)
Patient has completed antibiotic therapy. Bacteroides, splenic abscess.  Septic shock now resolved.  Abdominal drain removed on 11/13/21.

## 2021-11-09 NOTE — Assessment & Plan Note (Addendum)
Bilateral AKA.   Continue local wound care.   Now patient has an Conservator, museum/gallery

## 2021-11-09 NOTE — Progress Notes (Signed)
  Progress Note   Patient: Courtney Gray WGY:[id number] DOB: Dec 24, 1970 DOA: 11/01/2021     8 DOS: the patient was seen and examined on 11/09/2021   Brief hospital course: 51 y.o. female with medical history significant of ESRD on HD TTS, recent bacteremia and hepatic abscess on long-term IV antibiotics, chronic HF PEF, PVD status post bilateral AKA, IIDM, morbid obesity, chronic sacral wound stage II, presented with increasing shortness of breath.   Patient underwent bilateral AKA recently including right AKA in May and left AKA on 7/5.  And patient was recently diagnosed with septic shock secondary to bacteremia and liver abscess, for which patient has been on cefepime and Flagyl and the last dose will be August 4.  Recently, since patient has bilateral AKA, she has had extreme difficulty to get access to outpatient HD center.  She never missed her dialysis however according to outpatient HD records, it takes at least 4 miles stop and [facility] lift for patient to be seated in the dialysis chair.  Starting today, patient started to feel shortness of breath, no chest pain no swelling.  No cough no fever or chills. She is now awaiting placement for HD that takes stretcher and is hoping to find one locally.  Assessment and Plan: End stage renal disease [facility]) Patient will continue renal replacement therapy per nephrology recommendations.  Needs placement to continue with HD as outpatient.  Very deconditioned.   Anemia of chronic kidney disease, Hgb has been stable.  Continue with EPO and Iron supplementation.   Hypertension Continue blood pressure control with isosorbide and metoprolol.   Decubitus ulcer of sacral region, stage 2 ([place]) Present on admission. Continue with local care.   Type 2 diabetes mellitus with hyperlipidemia (HCC) Continue glucose cover and monitoring  Sliding scale insulin,   Statin therapy with atorvastatin.   Bacteremia Patient has completed antibiotic therapy. She  has a abdominal drain in place.  Continue close follow up as outpatient.   PVD (peripheral vascular disease) (HCC) Bilateral AKA.  Continue plan for follow up as outpatient with vascular surgery.   Class 3 obesity (HCC) Calculated BMI is 65,3         Subjective: Patient with no chest pain or dyspnea, no nausea or vomiting, continue to be very weak and deconditioned.   Physical Exam: Vitals:   11/08/21 1652 11/08/21 2114 11/09/21 0553 11/09/21 0820  BP: (!) 160/64 (!) 149/69 (!) 151/70 (!) 145/64  Pulse: 74 84 81 82  Resp: 20 16 18 16   Temp: 98.6 F (37 C) 99.4 F (37.4 C) 98.5 F (36.9 C) 97.9 F (36.6 C)  TempSrc: Oral Oral    SpO2: 100% 100% 98% 100%  Weight:      Height:       Neurology awake and alert ENT with mild pallor Cardiovascular with S1 and S2 present and rhythmic Respiratory with no rales or wheezing Abdomen protuberant but not distended, left upper quadrant drain in place.  Bilateral AKA  Data Reviewed:    Family Communication: no family at the bedside   Disposition: Status is: Inpatient Remains inpatient appropriate because: pending placement   Planned Discharge Destination: Skilled nursing facility      Author: Tawni Millers, MD 11/09/2021 11:40 AM  For on call review [email or web address].

## 2021-11-09 NOTE — Plan of Care (Signed)
  Problem: Nutritional: Goal: Maintenance of adequate nutrition will improve Outcome: Progressing   Problem: Clinical Measurements: Goal: Diagnostic test results will improve Outcome: Progressing   Problem: Nutrition: Goal: Adequate nutrition will be maintained Outcome: Progressing   Problem: Activity: Goal: Risk for activity intolerance will decrease Outcome: Not Progressing

## 2021-11-09 NOTE — Progress Notes (Addendum)
[facility] Progress Note   Subjective:  Seen in room - no overnight issues. Denies CP/dyspnea - still using O2. Not issues with HD yesterday - net UF 3L.  Objective Vitals:   11/08/21 1652 11/08/21 2114 11/09/21 0553 11/09/21 0820  BP: (!) 160/64 (!) 149/69 (!) 151/70 (!) 145/64  Pulse: 74 84 81 82  Resp: 20 16 18 16   Temp: 98.6 F (37 C) 99.4 F (37.4 C) 98.5 F (36.9 C) 97.9 F (36.6 C)  TempSrc: Oral Oral    SpO2: 100% 100% 98% 100%  Weight:      Height:       Physical Exam General: Chronically ill appearing woman, obese. Nasal O2 in place. Heart: RRR; no murmur Lungs: CTAB; reduced air movement throughout Abdomen: soft Extremities: bilateral AKA; 1+ LUE edema [facility]ialysis Access: [facility]  Additional Objective Labs: Basic Metabolic Panel: Recent Labs  Lab 11/04/21 0250 11/06/21 0403 11/08/21 0758  NA 134* 133* 135  K 3.5 5.2* 3.5  CL 99 99 97*  CO2 25 22 29   GLUCOSE 109* 82 105*  BUN 24* 24* 18  CREATININE 5.56* 5.32* 4.90*  CALCIUM 8.5* 8.8* 8.5*  PHOS 2.8  --  3.5   Liver Function Tests: Recent Labs  Lab 11/06/21 0403 11/08/21 0758  AST 28  --   ALT 7  --   ALKPHOS 62  --   BILITOT 0.9  --   PROT 6.9  --   ALBUMIN 2.2* 2.0*   CBC: Recent Labs  Lab 11/06/21 1805 11/08/21 0759  WBC 10.3 10.0  HGB 9.0* 8.4*  HCT 29.0* 29.0*  MCV 95.4 96.3  PLT 234 226   Medications:   atorvastatin  10 mg Oral Daily   bisacodyl  10 mg Rectal Once   calcium carbonate  200 mg of elemental calcium Oral TID WC   darbepoetin (ARANESP) injection - DIALYSIS  100 mcg Intravenous Q Fri-HD   docusate sodium  100 mg Oral BID   feeding supplement (NEPRO CARB STEADY)  237 mL Oral BID BM   gabapentin  100 mg Oral BID   Gerhardt's butt cream   Topical TID   heparin  5,000 Units Subcutaneous Q12H   insulin aspart  0-5 Units Subcutaneous QHS   insulin aspart  0-6 Units Subcutaneous TID WC   isosorbide mononitrate  30 mg Oral Daily   metoprolol tartrate  25  mg Oral BID   mouth rinse  15 mL Mouth Rinse 4 times per day   pantoprazole  40 mg Oral Daily   polyethylene glycol  17 g Oral Daily   prochlorperazine  5 mg Intravenous Once   sorbitol, milk of mag, mineral oil, glycerin (SMOG) enema  960 mL Rectal Once    Dialysis Orders: MWF - [place] ---?transferring to [facility] 4hrs, BFR 400, DFR 600,  AVF, EDW 146kg, 3K/ 2Ca - Heparin 2500 units with HD - Hectorol 93mcg IV qHD, held as of 10/30/21     Assessment/Plan: ESRD - on HD MWF. Next HD 8/7. Hypertension/volume - BP variable, UF as able with HD.  Metoprolol 25mg  BID started 11/04/21. Last CXR with B effusions/pulm edema. Anemia of CKD - Hgb down to 8.4, transferrin too low to calculate tsat. Will start IV iron weekly for now since she is off IV abx. Secondary Hyperparathyroidism - Ca/Phos to goal. Hectorol on hold, getting Tums 1000mg  TID as binder -> reduced dose to 500mg  TID.  Nutrition - Renal diet, have added supplements. Alb very  low. Dispo - Very difficulty situation. Patient required 4 staff members and 2 hoyer pads for transfer into HD chair as OP. Not staying on HD for full treatment d/t pain for multiple decubitus ulcers. This is a major safety concern for staff and patient in the outpatient center. Also concern for inability for ulcers to heal properly d/t frequent and rigorous transfers that are required. Need to determine new placement at a facility where she can receive HD in the bed-more likley a LTAC if possible.   Decubs-  abx and local wound care Recent septic shock 2/2 bacteremia/liver abscess - Now s/p course of IV cefepime as of 11/08/21.    [name], PA-C 11/09/2021, 9:35 AM  [place]  Nephrology attending: I have personally seen and examined the patient.  Chart reviewed.  I agree with above. ESRD on HD tolerating dialysis well with UF 3 L yesterday.  Currently on IV antibiotics and awaiting safe discharge. Next dialysis on Monday.  Katheran James, [facility].

## 2021-11-09 NOTE — Assessment & Plan Note (Addendum)
Patient on midodrine on HD to prevent hypotension. Currently off antihypertensive medications.

## 2021-11-09 NOTE — Assessment & Plan Note (Addendum)
Calculated BMI is 45.8

## 2021-11-09 NOTE — Plan of Care (Signed)
  Problem: Skin Integrity: Goal: Risk for impaired skin integrity will decrease Outcome: Progressing   

## 2021-11-10 DIAGNOSIS — I159 Secondary hypertension, unspecified: Secondary | ICD-10-CM | POA: Diagnosis not present

## 2021-11-10 DIAGNOSIS — E1169 Type 2 diabetes mellitus with other specified complication: Secondary | ICD-10-CM | POA: Diagnosis not present

## 2021-11-10 DIAGNOSIS — R7881 Bacteremia: Secondary | ICD-10-CM | POA: Diagnosis not present

## 2021-11-10 DIAGNOSIS — N186 End stage renal disease: Secondary | ICD-10-CM | POA: Diagnosis not present

## 2021-11-10 LAB — GLUCOSE, CAPILLARY
Glucose-Capillary: 95 mg/dL (ref 70–99)
Glucose-Capillary: 141 mg/dL — ABNORMAL HIGH (ref 70–99)
Glucose-Capillary: 176 mg/dL — ABNORMAL HIGH (ref 70–99)
Glucose-Capillary: 167 mg/dL — ABNORMAL HIGH (ref 70–99)

## 2021-11-10 MED ORDER — DIPHENHYDRAMINE HCL 25 MG PO CAPS
25.0000 mg | ORAL_CAPSULE | Freq: Four times a day (QID) | ORAL | Status: DC | PRN
Start: 2021-11-10 — End: 2022-01-27
  Administered 2021-12-10 – 2021-12-11 (×2): 25 mg via ORAL
  Filled 2021-11-10 (×4): qty 1

## 2021-11-10 NOTE — Plan of Care (Signed)
  Problem: Education: Goal: Ability to describe self-care measures that may prevent or decrease complications (Diabetes Survival Skills Education) will improve Outcome: Not Applicable

## 2021-11-10 NOTE — Progress Notes (Addendum)
[facility] Progress Note   Subjective:  Seen in room. No overnight issues. No CP/dyspnea - remains on nasal O2.  Objective Vitals:   11/09/21 1642 11/09/21 2052 11/10/21 0535 11/10/21 0909  BP: (!) 146/65 (!) 163/96 130/64 (!) 106/55  Pulse: 73 88 75 74  Resp: 16 19 18 16   Temp: 98.5 F (36.9 C) 98.3 F (36.8 C) 98.5 F (36.9 C) 98.4 F (36.9 C)  TempSrc: Oral Oral Oral Oral  SpO2: 99% 98% 98% 100%  Weight:      Height:       Physical Exam General: Chronically ill appearing woman, obese. Nasal O2 in place. Heart: RRR; no murmur Lungs: CTAB; reduced air movement throughout Abdomen: soft Extremities: bilateral AKA; 1+ LUE edema Dialysis Access: [facility]  Additional Objective Labs: Basic Metabolic Panel: Recent Labs  Lab 11/04/21 0250 11/06/21 0403 11/08/21 0758  NA 134* 133* 135  K 3.5 5.2* 3.5  CL 99 99 97*  CO2 25 22 29   GLUCOSE 109* 82 105*  BUN 24* 24* 18  CREATININE 5.56* 5.32* 4.90*  CALCIUM 8.5* 8.8* 8.5*  PHOS 2.8  --  3.5   Liver Function Tests: Recent Labs  Lab 11/06/21 0403 11/08/21 0758  AST 28  --   ALT 7  --   ALKPHOS 62  --   BILITOT 0.9  --   PROT 6.9  --   ALBUMIN 2.2* 2.0*  \ CBC: Recent Labs  Lab 11/06/21 1805 11/08/21 0759  WBC 10.3 10.0  HGB 9.0* 8.4*  HCT 29.0* 29.0*  MCV 95.4 96.3  PLT 234 226   Medications:  [START ON 11/11/2021] ferric gluconate (FERRLECIT) IVPB      atorvastatin  10 mg Oral Daily   calcium carbonate  200 mg of elemental calcium Oral TID WC   darbepoetin (ARANESP) injection - DIALYSIS  100 mcg Intravenous Q Fri-HD   docusate sodium  100 mg Oral BID   feeding supplement (NEPRO CARB STEADY)  237 mL Oral BID BM   gabapentin  100 mg Oral BID   Gerhardt's butt cream   Topical TID   heparin  5,000 Units Subcutaneous Q12H   insulin aspart  0-5 Units Subcutaneous QHS   insulin aspart  0-6 Units Subcutaneous TID WC   isosorbide mononitrate  30 mg Oral Daily   metoprolol tartrate  25 mg Oral  BID   mouth rinse  15 mL Mouth Rinse 4 times per day   pantoprazole  40 mg Oral Daily   polyethylene glycol  17 g Oral Daily    Dialysis Orders: MWF - [place] ---?transferring to [place] 4hrs, BFR 400, DFR 600,  AVF, EDW 146kg, 3K/ 2Ca - Heparin 2500 units with HD - Hectorol 42mcg IV qHD, held as of 10/30/21     Assessment/Plan: ESRD - on HD MWF. Next HD 8/7. Hypertension/volume - BP variable, UF as able with HD.  Metoprolol 25mg  BID started 11/04/21. Last CXR with B effusions/pulm edema. Weights seem way off - up 25kg from admit? 4L UFG as tolerated. Anemia of CKD - Hgb down to 8.4, transferrin too low to calculate tsat. Will start IV iron weekly for now since she is off IV abx. Secondary Hyperparathyroidism - Ca/Phos to goal. Hectorol on hold, getting Tums 1000mg  TID as binder -> reduced dose to 500mg  TID.  Nutrition - Renal diet, have added supplements. Alb very low. Dispo - Very difficulty situation. Patient required 4 staff members and 2 hoyer pads for transfer into  HD chair as OP. Not staying on HD for full treatment d/t pain for multiple decubitus ulcers. This is a major safety concern for staff and patient in the outpatient center. Also concern for inability for ulcers to heal properly d/t frequent and rigorous transfers that are required. Need to determine new placement at a facility where she can receive HD in the bed-more likley a LTAC if possible.   Decubs-  abx and local wound care Recent septic shock 2/2 bacteremia/liver abscess - Now s/p course of IV cefepime as of 11/08/21.  [name], PA-C 11/10/2021, 10:01 AM  [place]  Nephrology attending: I have personally seen and examined the patient.  Chart reviewed and I agree with above. ESRD HD, tolerating dialysis well.  Plan for regular HD tomorrow.  Completed IV antibiotics.  Now awaiting safe discharge plan to SNF and HD unit.  Katheran James, [facility].

## 2021-11-10 NOTE — Progress Notes (Signed)
  Progress Note   Patient: Courtney Gray EZM:[id number] DOB: 1970-07-13 DOA: 11/01/2021     9 DOS: the patient was seen and examined on 11/10/2021   Brief hospital course: 51 y.o. female with medical history significant of ESRD on HD TTS, recent bacteremia and hepatic abscess on long-term IV antibiotics, chronic HF PEF, PVD status post bilateral AKA, IIDM, morbid obesity, chronic sacral wound stage II, presented with increasing shortness of breath.   Patient underwent bilateral AKA recently including right AKA in May and left AKA on 7/5.  And patient was recently diagnosed with septic shock secondary to bacteremia and liver abscess, for which patient has been on cefepime and Flagyl and the last dose will be August 4.  Recently, since patient has bilateral AKA, she has had extreme difficulty to get access to outpatient HD center.  She never missed her dialysis however according to outpatient HD records, it takes at least 4 miles stop and [facility] lift for patient to be seated in the dialysis chair.  Starting today, patient started to feel shortness of breath, no chest pain no swelling.  No cough no fever or chills. She is now awaiting placement for HD that takes stretcher and is hoping to find one locally.  Assessment and Plan: End stage renal disease [facility]) Patient will continue renal replacement therapy per nephrology recommendations.  Needs placement to continue with HD as outpatient.  Very deconditioned.   Anemia of chronic kidney disease, Hgb has been stable.  Continue with EPO and Iron supplementation.   Hypertension Continue blood pressure control with isosorbide and metoprolol.   Decubitus ulcer of sacral region, stage 2 ([place]) Present on admission. Continue with local care.   Type 2 diabetes mellitus with hyperlipidemia (HCC) Continue glucose cover and monitoring  Sliding scale insulin,   Statin therapy with atorvastatin.   Bacteremia Patient has completed antibiotic therapy. She  has a abdominal drain in place.  Continue close follow up as outpatient.   PVD (peripheral vascular disease) (HCC) Bilateral AKA.   Plan for follow up with vascular surgery Dr. Stanford Breed on 11/21/21 for stable removal. Continue local wound care.    Class 3 obesity (HCC) Calculated BMI is 65,3         Subjective: Patient with no chest pain or dyspnea, her appetite has improved, no chest pain or dyspnea.   Physical Exam: Vitals:   11/09/21 1642 11/09/21 2052 11/10/21 0535 11/10/21 0909  BP: (!) 146/65 (!) 163/96 130/64 (!) 106/55  Pulse: 73 88 75 74  Resp: 16 19 18 16   Temp: 98.5 F (36.9 C) 98.3 F (36.8 C) 98.5 F (36.9 C) 98.4 F (36.9 C)  TempSrc: Oral Oral Oral Oral  SpO2: 99% 98% 98% 100%  Weight:      Height:       Neurology awake and alert ENT with mild pallor Cardiovascular with S1 and S2 present and rhythmic with no gallops, rubs or murmurs Respiratory with no raled or wheezing Abdomen not distended, drain in place.  Bilateral BKA, with stable on the left.    Data Reviewed:    Family Communication: no family at the bedside   Disposition: Status is: Inpatient Remains inpatient appropriate because: pending placement   Planned Discharge Destination:  to be determined      Author: Tawni Millers, MD 11/10/2021 1:06 PM  For on call review [email or web address].

## 2021-11-11 DIAGNOSIS — Z992 Dependence on renal dialysis: Secondary | ICD-10-CM

## 2021-11-11 DIAGNOSIS — N186 End stage renal disease: Secondary | ICD-10-CM | POA: Diagnosis not present

## 2021-11-11 LAB — GLUCOSE, CAPILLARY
Glucose-Capillary: 88 mg/dL (ref 70–99)
Glucose-Capillary: 123 mg/dL — ABNORMAL HIGH (ref 70–99)
Glucose-Capillary: 82 mg/dL (ref 70–99)
Glucose-Capillary: 196 mg/dL — ABNORMAL HIGH (ref 70–99)

## 2021-11-11 LAB — RENAL FUNCTION PANEL
Albumin: 2.1 g/dL — ABNORMAL LOW (ref 3.5–5.0)
Anion gap: 10 (ref 5–15)
BUN: 30 mg/dL — ABNORMAL HIGH (ref 6–20)
CO2: 27 mmol/L (ref 22–32)
Calcium: 8.3 mg/dL — ABNORMAL LOW (ref 8.9–10.3)
Chloride: 97 mmol/L — ABNORMAL LOW (ref 98–111)
Creatinine, Ser: 6.79 mg/dL — ABNORMAL HIGH (ref 0.44–1.00)
GFR, Estimated: 7 mL/min — ABNORMAL LOW (ref >60)
Glucose, Bld: 117 mg/dL — ABNORMAL HIGH (ref 70–99)
Phosphorus: 4.2 mg/dL (ref 2.5–4.6)
Potassium: 3.3 mmol/L — ABNORMAL LOW (ref 3.5–5.1)
Sodium: 134 mmol/L — ABNORMAL LOW (ref 135–145)

## 2021-11-11 LAB — CBC
HCT: 29.2 % — ABNORMAL LOW (ref 36.0–46.0)
Hemoglobin: 8.9 g/dL — ABNORMAL LOW (ref 12.0–15.0)
MCH: 29.3 pg (ref 26.0–34.0)
MCHC: 30.5 g/dL (ref 30.0–36.0)
MCV: 96.1 fL (ref 80.0–100.0)
Platelets: 225 10*3/uL (ref 150–400)
RBC: 3.04 MIL/uL — ABNORMAL LOW (ref 3.87–5.11)
RDW: 16.9 % — ABNORMAL HIGH (ref 11.5–15.5)
WBC: 11.3 10*3/uL — ABNORMAL HIGH (ref 4.0–10.5)
nRBC: 0.2 % (ref 0.0–0.2)

## 2021-11-11 MED ORDER — HEPARIN SODIUM (PORCINE) 1000 UNIT/ML IJ SOLN
INTRAMUSCULAR | Status: AC
  Administered 2021-11-11: 1000 [IU]
  Filled 2021-11-11: qty 5

## 2021-11-11 MED ORDER — MEDIHONEY WOUND/BURN DRESSING EX PSTE
1.0000 | PASTE | Freq: Every day | CUTANEOUS | Status: DC
  Administered 2021-11-11 – 2021-12-10 (×26): 1 via TOPICAL
  Filled 2021-11-11 (×7): qty 44

## 2021-11-11 MED ORDER — HYDROMORPHONE HCL 2 MG PO TABS
2.0000 mg | ORAL_TABLET | ORAL | Status: DC | PRN
  Administered 2021-11-20 – 2022-01-19 (×27): 2 mg via ORAL
  Filled 2021-11-11 (×32): qty 1

## 2021-11-11 NOTE — TOC Progression Note (Signed)
Transition of Care [facility]) - Initial/Assessment Note    Patient Details  Name: Courtney Gray MRN: [id number] Date of Birth: October 29, 1970  Transition of Care [facility]) CM/SW Contact:    Milinda Antis, [place] Phone Number: 11/11/2021, 5:08 PM  Clinical Narrative:                 CSW researched other options for dialysis is outpatient cannot be found.  No options as of now.  Renal navigator waiting to hear if facility in [place] can accept for dialysis.    TOC will continue to follow.   Expected Discharge Plan: Long Term Nursing Home Barriers to Discharge: No SNF bed, Transportation, Waiting for outpatient dialysis   Patient Goals and CMS Choice Patient states their goals for this hospitalization and ongoing recovery are:: To be at a facility close to her daughter      Expected Discharge Plan and Services Expected Discharge Plan: [place]       Living arrangements for the past 2 months: Single Family Home                                      Prior Living Arrangements/Services Living arrangements for the past 2 months: Single Family Home Lives with:: Adult Children Patient language and need for interpreter reviewed:: Yes        Need for Family Participation in Patient Care: Yes (Comment) Care giver support system in place?: No (comment)   Criminal Activity/Legal Involvement Pertinent to Current Situation/Hospitalization: No - Comment as needed  Activities of Daily Living Home Assistive Devices/Equipment: [employer] fast streamer ADL Screening (condition at time of admission) Patient's cognitive ability adequate to safely complete daily activities?: No Is the patient deaf or have difficulty hearing?: No Does the patient have difficulty seeing, even when wearing glasses/contacts?: No Does the patient have difficulty concentrating, remembering, or making decisions?: No Patient able to express need for assistance with ADLs?: Yes Does the patient have difficulty dressing or  bathing?: Yes Independently performs ADLs?: No Does the patient have difficulty walking or climbing stairs?: Yes Weakness of Legs: Both Weakness of Arms/Hands: Both  Permission Sought/Granted Permission sought to share information with : Other (comment) Permission granted to share information with : Yes, Verbal Permission Granted  Share Information with NAME: Brazie,antoinette (Daughter)   [phone]  Permission granted to share info w AGENCY: SNF        Emotional Assessment Appearance:: Appears older than stated age Attitude/Demeanor/Rapport: Engaged Affect (typically observed): Tearful/Crying, Accepting Orientation: : Oriented to Situation, Oriented to  Time, Oriented to Place, Oriented to Self Alcohol / Substance Use: Not Applicable Psych Involvement: No (comment)  Admission diagnosis:  End stage renal disease ([place]) [N18.6] Fluid overload [E87.70] Obesity with serious comorbidity, unspecified classification, unspecified obesity type [E66.9] Pressure injury of right buttock, stage 2 (HCC) [L89.312] Patient Active Problem List   Diagnosis Date Noted   Hypertension    Type 2 diabetes mellitus with hyperlipidemia ([place])    Decubitus ulcer of sacral region, stage 2 ([place])    Class 3 obesity ([place])    Bacteremia    PVD (peripheral vascular disease) ([place])    Hepatic abscess    Pressure injury of skin 09/13/2021   Hx of AKA (above knee amputation), right ([place])    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   Lactic acidosis 09/10/2021   Mixed diabetic hyperlipidemia associated with type 2 diabetes mellitus ([place]) 09/10/2021  Coronary artery disease involving native coronary artery of native heart without angina pectoris 09/10/2021   Elevated troponin level not due myocardial infarction 09/10/2021   Hypotension 09/10/2021   Dry gangrene (HCC)    Pain 08/03/2021   Ischemic foot pain at rest 08/03/2021   Hypoglycemia 07/10/2021   Volume overload 07/09/2021   Leukocytosis 06/11/2021   Acute  gout 06/11/2021   Hyperkalemia 05/21/2021   Hemorrhoids    Hyperlipidemia 05/18/2021   Rectal bleeding 05/17/2021   Morbid obesity with BMI of 60.0-69.9, adult ([place]) 05/17/2021   NSTEMI (non-ST elevated myocardial infarction) ([place]) 05/16/2021   Essential hypertension 09/01/2020   End stage renal disease ([place]) 09/01/2020   Chronic respiratory failure with hypoxia ([place]) 09/01/2020   Hypertensive urgency 11/28/2019   Uncontrolled type 2 diabetes mellitus with hypoglycemia, with long-term current use of insulin ([place]) 11/28/2019   Anemia of chronic disease 11/28/2019   PCP:  Monico Blitz, MD Pharmacy:   [facility] [place] [address] [place] [address] Phone: [phone] Fax: [phone]  [facility] [address] [address] Phone: [phone] Fax: [phone]  [place], [place] - [address] [place] [address] Phone: [phone] Fax: [phone]     Social Determinants of Health ([place]) Interventions    Readmission Risk Interventions    09/16/2021    4:36 PM 08/28/2021   11:50 AM 07/12/2021   12:38 PM  Readmission Risk Prevention Plan  Transportation Screening Complete Complete Complete  Medication Review (RN Care Manager)  Complete Complete  PCP or Specialist appointment within 3-5 days of discharge  Complete Complete  HRI or Home Care Consult Complete Complete Complete  SW Recovery Care/Counseling Consult Complete Complete Complete  Palliative Care Screening  Not Applicable Not Applicable  Skilled Nursing Facility Complete Not Applicable Complete

## 2021-11-11 NOTE — Progress Notes (Signed)
[place] [facility] Kim reports that pt's referral has been sent to [facility] for review. Maudie Mercury states that a physician needs to review case and determine whether they will accept pt as a stretcher HD pt. Maudie Mercury to f/u with navigator once MD makes a determination.   Melven Sartorius Renal Navigator [phone]

## 2021-11-11 NOTE — Telephone Encounter (Signed)
Patient is currently admitted at [facility].

## 2021-11-11 NOTE — Progress Notes (Signed)
PROGRESS NOTE    Courtney Gray  JFH:[id number] DOB: [birth year] DOA: 11/01/2021 PCP: Monico Blitz, MD   Brief Narrative:  51 y.o. female with medical history significant of ESRD on HD TTS, recent bacteremia and hepatic abscess on long-term IV antibiotics, chronic HF PEF, PVD status post bilateral AKA, IIDM, morbid obesity, chronic sacral wound stage II, presented with increasing shortness of breath.   Patient underwent bilateral AKA recently including right AKA in May and left AKA on 7/5.  And patient was recently diagnosed with septic shock secondary to bacteremia and liver abscess, for which patient has been on cefepime and Flagyl and the last dose will be August 4.  Recently, since patient has bilateral AKA, she has had extreme difficulty to get access to outpatient HD center.  She never missed her dialysis however according to outpatient HD records, it takes at least 4 staff members and [facility] lift for patient to be seated in the dialysis chair.  Starting today, patient started to feel shortness of breath, no chest pain no swelling.  No cough no fever or chills. She is now awaiting placement for HD that takes stretcher and is hoping to find one locally.     Assessment & Plan:   Active Problems:   End stage renal disease ([place])   Hypertension   Decubitus ulcer of sacral region, stage 2 (HCC)   Type 2 diabetes mellitus with hyperlipidemia (HCC)   Bacteremia   PVD (peripheral vascular disease) (HCC)   Class 3 obesity (HCC)  Assessment and Plan:  Acute on chronic HFpEF with chronic hypoxemic respiratory failure -Secondary to uncontrolled hypertension, clinical has signs of mild fluid overload at presentation .Imdur was started at presentation Volume status improved with HD    HTN emergency -BP has improved.  Continue isosorbide and metoprolol.   ESRD on HD -Admitting physician discussed with ED physician and case manager, likely patient will need long-term dialysis plan as  currently, increasingly, outpatient HD center had hard time to accommodate patient's dialysis.  -Ongoing HD as per nephrology team.  Their input appreciated. -Discussed in detail with TOC during morning rounds and they are exploring options, will be difficult to place.   Anemia of CKD -Iron studies ordered per nephrology, but patient refusing lab work No CBC since 8/4.  Hopefully some labs across HD today.  Hemoglobin prior to that was 8.4, 9 -Nephrology starting IV iron weekly.   Constipation -Bisacodyl suppository as MiraLAX does not work well for her -Patient has refused suppository as well as enema   Nausea/vomiting-ongoing -May have improved.   IIDM -Cont SSI as needed -Reasonably controlled   Body mass index is 65.3 kg/m./Morbid obesity -Recommend diet/lifestyle modification   Stage II sacral wound -Chronic, WOC consulted  Pressure Injury 09/11/21 Buttocks Left Stage 2 -  Partial thickness loss of dermis presenting as a shallow open injury with a red, pink wound bed without slough. pink (Active)  09/11/21 0000  Location: Buttocks  Location Orientation: Left  Staging: Stage 2 -  Partial thickness loss of dermis presenting as a shallow open injury with a red, pink wound bed without slough.  Wound Description (Comments): pink  Present on Admission: Yes     Pressure Injury 09/11/21 Buttocks Right Stage 2 -  Partial thickness loss of dermis presenting as a shallow open injury with a red, pink wound bed without slough. pink (Active)  09/11/21 0000  Location: Buttocks  Location Orientation: Right  Staging: Stage 2 -  Partial thickness loss of dermis  presenting as a shallow open injury with a red, pink wound bed without slough.  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 On recent admit, developed septic shock from bacteroides  thetaiotaomicron bacteremia with septic emboli to spleen, initially on anticoagulation which was later held as splenic infarct was determined to be infectious and not blood clot -Pt receiving IV cefepime on HD and flagyl, completed 8/4 -Abdominal drain in place,?  Management. -Close outpatient follow-up.   PAD -Pt s/p B AKA for gangrene with plan to f/u with vascular surgery in 1 month (with Dr. Stanford Breed on 11/21/2021 for staple removal) -Pt was s/p L middle finger amputation for gangrene at recent admit   Hyperlipidemia: Continue atorvastatin.  Chronic respiratory failure with hypoxia: Patient reports that she uses 4 L/min home oxygen PTA.     DVT prophylaxis:Heparin Code Status: Full Family Communication: None at bedside. Disposition Plan:  Status is: Inpatient Remains inpatient appropriate because: IV medications and need to arrange outpatient safe discharge disposition including regular hemodialysis.     Consultants:  Nephrology   Procedures:  None    Antimicrobials:  Anti-infectives (From admission, onward)    Start     Dose/Rate Route Frequency Ordered Stop   11/04/21 1800  ceFEPIme (MAXIPIME) 2 g in sodium chloride 0.9 % 100 mL IVPB        2 g 200 mL/hr over 30 Minutes Intravenous Every M-W-F (1800) 11/03/21 1962 11/08/21 2359   11/04/21 1200  ceFEPIme (MAXIPIME) 2 g in sodium chloride 0.9 % 100 mL IVPB  Status:  Discontinued        2 g 200 mL/hr over 30 Minutes Intravenous Every M-W-F (Hemodialysis) 11/02/21 0941 11/03/21 0952   11/02/21 1800  ceFEPIme (MAXIPIME) 2 g in sodium chloride 0.9 % 100 mL IVPB        2 g 200 mL/hr over 30 Minutes Intravenous  Once 11/02/21 0941 11/02/21 1812   11/02/21 1200  ceFEPIme (MAXIPIME) 2 g in sodium chloride 0.9 % 100 mL IVPB  Status:  Discontinued        2 g 200 mL/hr over 30 Minutes Intravenous Every T-Th-Sa (Hemodialysis) 11/01/21 1934 11/02/21 0941   11/01/21 2200  metroNIDAZOLE (FLAGYL) tablet 500 mg        500 mg Oral  Every 12 hours 11/01/21 1827 11/08/21 2225   11/01/21 1945  ceFEPIme (MAXIPIME) 1 g in sodium chloride 0.9 % 100 mL IVPB        1 g 200 mL/hr over 30 Minutes Intravenous  Once 11/01/21 1933 11/01/21 2032   11/01/21 1730  ceFEPIme (MAXIPIME) 1 g in sodium chloride 0.9 % 100 mL IVPB  Status:  Discontinued        1 g 200 mL/hr over 30 Minutes Intravenous Every 24 hours 11/01/21 1720 11/01/21 1933         Subjective: Seen this morning prior to HD.  Denied complaints.  Specifically denies dyspnea or pain anywhere.  Objective: Vitals:   11/11/21 1300 11/11/21 1330 11/11/21 1400 11/11/21 1430  BP: (!) 108/48 116/66 113/78 133/63  Pulse: 73 73 73 75  Resp: 19 (!) 22 16 14   Temp:      TempSrc:      SpO2: 100% 100% 100% 100%  Weight:      Height:        Intake/Output Summary (Last 24 hours) at 11/11/2021 1448 Last data  filed at 11/11/2021 1100 Gross per 24 hour  Intake 760 ml  Output 0 ml  Net 760 ml   Filed Weights   11/06/21 2205 11/08/21 0800 11/08/21 1155  Weight: (!) 170 kg (!) 181 kg (!) 178 kg    Examination:  General exam: Young female, moderately built and very morbidly obese sitting up comfortably in bed without distress. Respiratory system: Clear to auscultation.  No increased work of breathing.  Has [place] Cardiovascular system: S1 and S2 heard, RRR.  No JVD, murmurs.  Not on telemetry. Gastrointestinal system: Abdomen is obese, soft and nontender.  Normal bowel sounds heard. Central nervous system: Alert and oriented.  No focal neurological deficits. Extremities: Bilateral AKA,-did not examine today. Psychiatry: Flat affect.    Data Reviewed: I have personally reviewed following labs and imaging studies  CBC: Recent Labs  Lab 11/06/21 1805 11/08/21 0759  WBC 10.3 10.0  HGB 9.0* 8.4*  HCT 29.0* 29.0*  MCV 95.4 96.3  PLT 234 563   Basic Metabolic Panel: Recent Labs  Lab 11/06/21 0403 11/08/21 0758  NA 133* 135  K 5.2* 3.5  CL 99 97*  CO2 22 29   GLUCOSE 82 105*  BUN 24* 18  CREATININE 5.32* 4.90*  CALCIUM 8.8* 8.5*  PHOS  --  3.5   GFR: Estimated Creatinine Clearance: 22.6 mL/min (A) (by C-G formula based on SCr of 4.9 mg/dL (H)). Liver Function Tests: Recent Labs  Lab 11/06/21 0403 11/08/21 0758  AST 28  --   ALT 7  --   ALKPHOS 62  --   BILITOT 0.9  --   PROT 6.9  --   ALBUMIN 2.2* 2.0*    CBG: Recent Labs  Lab 11/10/21 1153 11/10/21 1655 11/10/21 2106 11/11/21 0724 11/11/21 1113  GLUCAP 141* 176* 167* 88 123*    Anemia Panel: Recent Labs    11/09/21 0351  TIBC UNABLE TO CALCULATE  IRON 55    No results found for this or any previous visit (from the past 240 hour(s)).       Radiology Studies: No results found.      Scheduled Meds:  atorvastatin  10 mg Oral Daily   calcium carbonate  200 mg of elemental calcium Oral TID WC   darbepoetin (ARANESP) injection - DIALYSIS  100 mcg Intravenous Q Fri-HD   docusate sodium  100 mg Oral BID   feeding supplement (NEPRO CARB STEADY)  237 mL Oral BID BM   gabapentin  100 mg Oral BID   Gerhardt's butt cream   Topical TID   heparin  5,000 Units Subcutaneous Q12H   insulin aspart  0-5 Units Subcutaneous QHS   insulin aspart  0-6 Units Subcutaneous TID WC   isosorbide mononitrate  30 mg Oral Daily   leptospermum manuka honey  1 Application Topical Daily   metoprolol tartrate  25 mg Oral BID   mouth rinse  15 mL Mouth Rinse 4 times per day   pantoprazole  40 mg Oral Daily   polyethylene glycol  17 g Oral Daily   Continuous Infusions:  ferric gluconate (FERRLECIT) IVPB       LOS: 10 days    Vernell Leep, MD,  FACP, [facility], [facility], [facility] (Care Management Physician Certified) [place]  To contact the attending provider between 7A-7P or the covering provider during after hours 7P-7A, please log into the web site [email or web address] and access using universal [place] password for that web site. If you  do not  have the password, please call the hospital operator.

## 2021-11-11 NOTE — Progress Notes (Signed)
OT Cancellation Note  Patient Details Name: Courtney Gray MRN: [id number] DOB: [birth year]   Cancelled Treatment:    Reason Eval/Treat Not Completed: Patient at procedure or test/ unavailable (pt off unit at HD, will follow up as schedule permits)  [name], OTD, OTR/L Acute Rehab [phone]) [phone]   Kaylyn Lim 11/11/2021, 12:58 PM

## 2021-11-11 NOTE — Progress Notes (Signed)
Dear Doctor: This patient has been identified as a candidate for CVC for the following reason (s): Poor vasculature, no appropriate vein for IV placement at this time. Has been assessed by multiple [facility] nurses. If you agree, please write an order for the indicated device.   Thank you for supporting the early vascular access assessment program.

## 2021-11-11 NOTE — Consult Note (Signed)
WOC Nurse Consult Note: Patient receiving care in [facility] 5M06. Primary RN present at time of my assessment. 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 It measures 12 cm x 8 cm x 6 cm. It is primarily yellow/brown in color with some pink. There is also a small, 2 cm x 3 cm partial thickness area at the upper, medial right buttock that could be from moisture and friction. If it is related to pressure it is equivalent to a stage 2 PI. It is 100% pink and moist. Pressure Injury POA: Yes Measurement: as above Wound bed: as above Drainage (amount, consistency, odor) yellow drainage to right ischial wound. Periwound: intact Dressing procedure/placement/frequency: Place Medihoney into the right ischial/right posterior thigh wound. Cover with dry gauze, then foam dressing. This is a daily order. Continue the use of a foam dressing to the right buttock wound. Change every 3 days and prn soilage.  The patient is already on a bariatric bed with low air loss mattress.  Thank you for the consult.  Discussed plan of care with the patient and bedside nurse.  [place] nurse will not follow at this time.  Please re-consult the [place] team if needed.  Val Riles, RN, MSN, CWOCN, CNS-BC, pager [phone]

## 2021-11-11 NOTE — Progress Notes (Signed)
  Mobility Specialist Criteria Algorithm Info.    11/11/21 1243  Mobility  Activity Off unit;Contraindicated/medical hold   Will f/u as time permits.  11/11/2021 12:43 PM  Courtney Gray, [place], [place]  FXGXI:[phone] Office: [phone]

## 2021-11-11 NOTE — NC FL2 (Signed)
[place] LEVEL OF CARE SCREENING TOOL     IDENTIFICATION  Patient Name: Courtney Gray Birthdate: Aug 24, 1970 Sex: female Admission Date (Current Location): 11/01/2021  [place] and [place] Number:  Kathleen Argue [id number] [place] and Address:  [facility], [place] [address], [place],  [address]      Provider Number: [id number]  Attending Physician Name and Address:  Modena Jansky, MD  Relative Name and Phone Number:  Handel,antoinette (Daughter)   [phone]    Current Level of Care: Hospital Recommended Level of Care: Other (Comment) Prior Approval Number:    Date Approved/Denied:   PASRR Number: [id number]  Discharge Plan: Home    Current Diagnoses: Patient Active Problem List   Diagnosis Date Noted   Hypertension    Type 2 diabetes mellitus with hyperlipidemia ([place])    Decubitus ulcer of sacral region, stage 2 ([place])    Class 3 obesity ([place])    Bacteremia    PVD (peripheral vascular disease) ([place])    Hepatic abscess    Pressure injury of skin 09/13/2021   Hx of AKA (above knee amputation), right ([place])    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   Lactic acidosis 09/10/2021   Mixed diabetic hyperlipidemia associated with type 2 diabetes mellitus ([place]) 09/10/2021   Coronary artery disease involving native coronary artery of native heart without angina pectoris 09/10/2021   Elevated troponin level not due myocardial infarction 09/10/2021   Hypotension 09/10/2021   Dry gangrene (HCC)    Pain 08/03/2021   Ischemic foot pain at rest 08/03/2021   Hypoglycemia 07/10/2021   Volume overload 07/09/2021   Leukocytosis 06/11/2021   Acute gout 06/11/2021   Hyperkalemia 05/21/2021   Hemorrhoids    Hyperlipidemia 05/18/2021   Rectal bleeding 05/17/2021   Morbid obesity with BMI of 60.0-69.9, adult ([employer]) 05/17/2021   NSTEMI (non-ST elevated myocardial infarction) ([place]) 05/16/2021   Essential hypertension 09/01/2020   End stage renal  disease ([place]) 09/01/2020   Chronic respiratory failure with hypoxia ([place]) 09/01/2020   Hypertensive urgency 11/28/2019   Uncontrolled type 2 diabetes mellitus with hypoglycemia, with long-term current use of insulin ([place]) 11/28/2019   Anemia of chronic disease 11/28/2019    Orientation RESPIRATION BLADDER Height & Weight     Self, Time, Situation, Place  O2 (4L) Continent Weight: (!) 392 lb 6.7 oz (178 kg) Height:  5\' 5"  (165.1 cm)  BEHAVIORAL SYMPTOMS/MOOD NEUROLOGICAL BOWEL NUTRITION STATUS      Incontinent Diet (see d/c summary)  AMBULATORY STATUS COMMUNICATION OF NEEDS Skin   Total Care Verbally  (Stage 2 buttocks Left; stage 2 buttocks right)                       Personal Care Assistance Level of Assistance  Bathing, Feeding, Dressing Bathing Assistance: Maximum assistance Feeding assistance: Independent Dressing Assistance: Maximum assistance     Functional Limitations Info  Sight, Hearing, Speech Sight Info: Adequate Hearing Info: Adequate Speech Info: Adequate    SPECIAL CARE FACTORS FREQUENCY  PT (By licensed PT), OT (By licensed OT)     PT Frequency: 5x/ week OT Frequency: 5x/ week            Contractures Contractures Info: Not present    Additional Factors Info  Code Status, Allergies, Insulin Sliding Scale Code Status Info: Full Allergies Info: Benzonatate   Hydralazine   Amoxicillin   Clonidine   Chlorhexidine   Morphine   Oxycodone   Insulin Sliding Scale Info: see d/c  medication list       Current Medications (11/11/2021):  This is the current hospital active medication list Current Facility-Administered Medications  Medication Dose Route Frequency Provider Last Rate Last Admin   acetaminophen (TYLENOL) tablet 1,000 mg  1,000 mg Oral PRN Lequita Halt, MD       atorvastatin (LIPITOR) tablet 10 mg  10 mg Oral Daily Wynetta Fines T, MD   10 mg at 11/11/21 0850   calcium carbonate (TUMS - dosed in mg elemental calcium) chewable tablet 200 mg of  elemental calcium  200 mg of elemental calcium Oral TID WC Loren Racer, PA-C       Darbepoetin Alfa (ARANESP) injection 100 mcg  100 mcg Intravenous Q Fri-HD Rosita Fire, MD       diphenhydrAMINE (BENADRYL) capsule 25 mg  25 mg Oral Q6H PRN Arrien, Jimmy Picket, MD       docusate sodium (COLACE) capsule 100 mg  100 mg Oral BID Wynetta Fines T, MD   100 mg at 11/11/21 0850   feeding supplement (NEPRO CARB STEADY) liquid 237 mL  237 mL Oral BID BM Loren Racer, PA-C   237 mL at 11/10/21 1616   ferric gluconate (FERRLECIT) 125 mg in sodium chloride 0.9 % 100 mL IVPB  125 mg Intravenous Q Mon-HD Stovall, Kathryn R, PA-C       gabapentin (NEURONTIN) capsule 100 mg  100 mg Oral BID Wynetta Fines T, MD   100 mg at 11/11/21 1610   Gerhardt's butt cream   Topical TID Donne Hazel, MD   1 Application at 96/04/54 2118   heparin injection 5,000 Units  5,000 Units Subcutaneous Q12H Wynetta Fines T, MD   5,000 Units at 11/11/21 0850   HYDROmorphone (DILAUDID) injection 0.5 mg  0.5 mg Intravenous Q4H PRN Donne Hazel, MD   0.5 mg at 11/04/21 1932   insulin aspart (novoLOG) injection 0-5 Units  0-5 Units Subcutaneous QHS Donne Hazel, MD       insulin aspart (novoLOG) injection 0-6 Units  0-6 Units Subcutaneous TID WC Donne Hazel, MD   1 Units at 11/10/21 1732   isosorbide mononitrate (IMDUR) 24 hr tablet 30 mg  30 mg Oral Daily Wynetta Fines T, MD   30 mg at 11/10/21 0981   leptospermum manuka honey (MEDIHONEY) paste 1 Application  1 Application Topical Daily Hongalgi, Lenis Dickinson, MD       metoprolol tartrate (LOPRESSOR) tablet 25 mg  25 mg Oral BID Donne Hazel, MD   25 mg at 11/10/21 2116   ondansetron (ZOFRAN-ODT) disintegrating tablet 4 mg  4 mg Oral Q8H PRN Donne Hazel, MD   4 mg at 11/08/21 0805   Oral care mouth rinse  15 mL Mouth Rinse 4 times per day Donne Hazel, MD   15 mL at 11/10/21 2119   Oral care mouth rinse  15 mL Mouth Rinse PRN Donne Hazel, MD        oxyCODONE (Oxy IR/ROXICODONE) immediate release tablet 5-10 mg  5-10 mg Oral Q4H PRN Wynetta Fines T, MD   10 mg at 11/05/21 0757   pantoprazole (PROTONIX) EC tablet 40 mg  40 mg Oral Daily Wynetta Fines T, MD   40 mg at 11/11/21 0850   polyethylene glycol (MIRALAX / GLYCOLAX) packet 17 g  17 g Oral Daily Donne Hazel, MD   17 g at 11/11/21 0850   promethazine (PHENERGAN) injection 12.5 mg  12.5  mg Intramuscular Q6H PRN Heath Lark D, DO   12.5 mg at 11/08/21 1656     Discharge Medications: Please see discharge summary for a list of discharge medications.  Relevant Imaging Results:  Relevant Lab Results:   Additional Information [id number],Both Covids 1 Booster, 392lbs 5'5", dialysis  Keerstin Bjelland F Lindora Alviar, LCSWA

## 2021-11-11 NOTE — Progress Notes (Addendum)
Physical Therapy Treatment Patient Details Name: Courtney Gray MRN: [id number] DOB: [birth year] Today's Date: 11/11/2021   History of Present Illness Pt adm 7/28 with acute pulmonary edema. 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    PT Comments    On arrival, pt's bed in full (level 3) rotation to her left with LUE caught in bed rail and upper body leaning over the left bed rail with pt sleeping. Easily awakened and worked on having pt use bed controller to lower her head and she tried multiple grips on controller but was not strong enough to depress the buttons. Bed placed in neutral rotation with mattress over-inflated to increase ease of movement and pt's ability to assist with mobility (rolling to supine and then to her right side, supine pull to long-sitting, "butt-walking" towards HOB while in long-sitting). Discussed with pt plan for 8/8 (when she should not have dialysis) is to get up to wheelchair if an appropriate one can be found (weight limit and with anti-tippers due to bil AKA). She is agreeable to get up to wheelchair or recliner, if necessary. At end of session, full rotation setting turned to level 1 and RN made aware.     Recommendations for follow up therapy are one component of a multi-disciplinary discharge planning process, led by the attending physician.  Recommendations may be updated based on patient status, additional functional criteria and insurance authorization.  Follow Up Recommendations  PT at Long-term acute care hospital Can patient physically be transported by private vehicle: No   Assistance Recommended at Discharge Frequent or constant Supervision/Assistance  Patient can return home with the following Two people to help with walking and/or transfers;Two people to help with bathing/dressing/bathroom;Assist for transportation;Assistance with cooking/housework;Help with  stairs or ramp for entrance   Equipment Recommendations  Wheelchair (measurements PT);Wheelchair cushion (measurements PT);Other (comment);Hospital bed (Needs wide w/c with cushion and anti-tippers with ?offset wheels to reduce risk of tipping backwards)    Recommendations for Other Services       Precautions / Restrictions Precautions Precautions: Fall Precaution Comments: bil AKA, 3L O2 at baseline, LUQ drain Restrictions Weight Bearing Restrictions: Yes RLE Weight Bearing: Non weight bearing LLE Weight Bearing: Non weight bearing Other Position/Activity Restrictions: minimized pushing/pulling and weight through L hand, dressing now removed     Mobility  Bed Mobility Overal bed mobility: Needs Assistance Bed Mobility: Supine to Sit, Sit to Supine, Rolling Rolling: Max assist   Supine to sit: Min assist Sit to supine: Min guard   General bed mobility comments: bed taken off rotation and max-inflated to promote pt's ability to mobilize herself in the bed;    Transfers                        Ambulation/Gait               General Gait Details: unable   Stairs             Wheelchair Mobility    Modified Rankin (Stroke Patients Only)       Balance Overall balance assessment: Needs assistance Sitting-balance support: No upper extremity supported Sitting balance-[name] Scale: Good Sitting balance - Comments: with long sitting in bed  Cognition Arousal/Alertness: Awake/alert Behavior During Therapy: WFL for tasks assessed/performed Overall Cognitive Status: Within Functional Limits for tasks assessed                                 General Comments: pt with eyes closed intermittently, but awake, very slowed in response to questions        Exercises Amputee Exercises Hip Extension: Both, Supine, PROM (HOB lowered flat for extension of hips; maintained x 1 minute) Hip  ABduction/ADduction: AROM, Both, Supine, 5 reps Other Exercises Other Exercises: crunch to long-sitting Other Exercises: Lateral weight shift left and right with "walking" backwards on buttocks toward HOB in sitting position x 10 reps with min assist    General Comments        Pertinent Vitals/Pain Pain Assessment Pain Assessment: Faces Faces Pain Scale: No hurt    Home Living                          Prior Function            PT Goals (current goals can now be found in the care plan section) Acute Rehab PT Goals Patient Stated Goal: Pt wants to get up into a w/c Time For Goal Achievement: 11/17/21 Potential to Achieve Goals: Fair Progress towards PT goals: Progressing toward goals    Frequency    Min 2X/week      PT Plan Current plan remains appropriate    Co-evaluation              AM-PAC PT "6 Clicks" Mobility   Outcome Measure  Help needed turning from your back to your side while in a flat bed without using bedrails?: Total Help needed moving from lying on your back to sitting on the side of a flat bed without using bedrails?: Total Help needed moving to and from a bed to a chair (including a wheelchair)?: Total Help needed standing up from a chair using your arms (e.g., wheelchair or bedside chair)?: Total Help needed to walk in hospital room?: Total Help needed climbing 3-5 steps with a railing? : Total 6 Click Score: 6    End of Session Equipment Utilized During Treatment: Oxygen Activity Tolerance: Patient tolerated treatment well Patient left: with call bell/phone within reach;in bed Nurse Communication: Other (comment) (pt's bed on full rotation with pt's LUE caught in bed rail and pt's upper body leaning over rail on arrival; turned rotation down to level 1) PT Visit Diagnosis: Other abnormalities of gait and mobility (R26.89);Muscle weakness (generalized) (M62.81) Pain - Right/Left: Left Pain - part of body:  (residual limb)      Time: 1884-1660 PT Time Calculation (min) (ACUTE ONLY): 19 min  Charges:  $Therapeutic Activity: 8-22 mins                      [place]  Office [phone]    [name] 11/11/2021, 12:15 PM

## 2021-11-11 NOTE — Progress Notes (Signed)
At this time. Confirmed with nurse that this patient does not have any appropriate veins for PIV placement at this time. Fran Lowes, RN VAST

## 2021-11-11 NOTE — Progress Notes (Signed)
Pt refused dressing change on this shift. Pt stated that it was changed twice already by dayshift nurse. This nurse educated that orders state that dressing on right hip should be changed every shift, educated pt on the importance of having dressing change. Will continue to monitor.

## 2021-11-11 NOTE — Progress Notes (Signed)
[facility] Progress Note   Subjective:  Seen in room. No new complaints. Denies cp, dyspnea. For dialysis today.  HD Objective Vitals:   11/10/21 0535 11/10/21 0909 11/10/21 2107 11/11/21 0533  BP: 130/64 (!) 106/55 (!) 156/70 (!) 151/82  Pulse: 75 74 75 70  Resp: 18 16 18 18   Temp: 98.5 F (36.9 C) 98.4 F (36.9 C) 98.9 F (37.2 C) 97.9 F (36.6 C)  TempSrc: Oral Oral Oral Oral  SpO2: 98% 100% 100% 98%  Weight:      Height:       Physical Exam General: Chronically ill appearing woman, obese. Nasal O2 in place. Heart: RRR; no murmur Lungs: CTAB; reduced air movement throughout Abdomen: soft Extremities: bilateral AKA; 1+ LUE edema Dialysis Access: [facility]  Additional Objective Labs: Basic Metabolic Panel: Recent Labs  Lab 11/06/21 0403 11/08/21 0758  NA 133* 135  K 5.2* 3.5  CL 99 97*  CO2 22 29  GLUCOSE 82 105*  BUN 24* 18  CREATININE 5.32* 4.90*  CALCIUM 8.8* 8.5*  PHOS  --  3.5    Liver Function Tests: Recent Labs  Lab 11/06/21 0403 11/08/21 0758  AST 28  --   ALT 7  --   ALKPHOS 62  --   BILITOT 0.9  --   PROT 6.9  --   ALBUMIN 2.2* 2.0*   \ CBC: Recent Labs  Lab 11/06/21 1805 11/08/21 0759  WBC 10.3 10.0  HGB 9.0* 8.4*  HCT 29.0* 29.0*  MCV 95.4 96.3  PLT 234 226    Medications:  ferric gluconate (FERRLECIT) IVPB      atorvastatin  10 mg Oral Daily   calcium carbonate  200 mg of elemental calcium Oral TID WC   darbepoetin (ARANESP) injection - DIALYSIS  100 mcg Intravenous Q Fri-HD   docusate sodium  100 mg Oral BID   feeding supplement (NEPRO CARB STEADY)  237 mL Oral BID BM   gabapentin  100 mg Oral BID   Gerhardt's butt cream   Topical TID   heparin  5,000 Units Subcutaneous Q12H   insulin aspart  0-5 Units Subcutaneous QHS   insulin aspart  0-6 Units Subcutaneous TID WC   isosorbide mononitrate  30 mg Oral Daily   metoprolol tartrate  25 mg Oral BID   mouth rinse  15 mL Mouth Rinse 4 times per day    pantoprazole  40 mg Oral Daily   polyethylene glycol  17 g Oral Daily    Dialysis Orders: MWF - [place] ---?transferring to [facility] 4hrs, BFR 400, DFR 600,  AVF, EDW 146kg, 3K/ 2Ca - Heparin 2500 units with HD - Hectorol 66mcg IV qHD, held as of 10/30/21     Assessment/Plan: ESRD - on HD MWF. Next HD 8/7. Hypertension/volume - BP variable, UF as able with HD.  Metoprolol 25mg  BID started 11/04/21. Last CXR with B effusions/pulm edema. Weights seem way off - up 25kg from admit? 4L UFG as tolerated. Anemia of CKD - Hgb down to 8.4, transferrin too low to calculate tsat. Will start IV iron weekly for now since she is off IV abx. Secondary Hyperparathyroidism - Ca/Phos to goal. Hectorol on hold, getting Tums 1000mg  TID as binder -> reduced dose to 500mg  TID.  Nutrition - Renal diet, have added supplements. Alb very low. Dispo - Very difficulty situation. Patient required 4 staff members and 2 hoyer pads for transfer into HD chair as OP. Not staying on HD for full treatment  d/t pain for multiple decubitus ulcers. This is a major safety concern for staff and patient in the outpatient center. Also concern for inability for ulcers to heal properly d/t frequent and rigorous transfers that are required. Need to determine new placement at a facility where she can receive HD in the bed-more likley a LTAC if possible.   Decubs-  abx and local wound care Recent septic shock 2/2 bacteremia/liver abscess - Now s/p course of IV cefepime as of 11/08/21.  Courtney Child PA-C [facility] 11/11/2021,9:30 AM

## 2021-11-12 DIAGNOSIS — K750 Abscess of liver: Secondary | ICD-10-CM | POA: Diagnosis not present

## 2021-11-12 DIAGNOSIS — Z992 Dependence on renal dialysis: Secondary | ICD-10-CM | POA: Diagnosis not present

## 2021-11-12 DIAGNOSIS — N186 End stage renal disease: Secondary | ICD-10-CM | POA: Diagnosis not present

## 2021-11-12 LAB — GLUCOSE, CAPILLARY
Glucose-Capillary: 130 mg/dL — ABNORMAL HIGH (ref 70–99)
Glucose-Capillary: 136 mg/dL — ABNORMAL HIGH (ref 70–99)
Glucose-Capillary: 116 mg/dL — ABNORMAL HIGH (ref 70–99)
Glucose-Capillary: 126 mg/dL — ABNORMAL HIGH (ref 70–99)

## 2021-11-12 MED ORDER — POLYETHYLENE GLYCOL 3350 17 G PO PACK
17.0000 g | PACK | Freq: Two times a day (BID) | ORAL | Status: DC
  Administered 2021-11-16 – 2021-11-27 (×3): 17 g via ORAL
  Filled 2021-11-12 (×25): qty 1

## 2021-11-12 MED ORDER — SENNA 8.6 MG PO TABS
1.0000 | ORAL_TABLET | Freq: Every day | ORAL | Status: DC
  Administered 2021-11-12 – 2021-11-27 (×14): 8.6 mg via ORAL
  Filled 2021-11-12 (×16): qty 1

## 2021-11-12 MED ORDER — BISACODYL 5 MG PO TBEC
10.0000 mg | DELAYED_RELEASE_TABLET | Freq: Once | ORAL | Status: AC
  Administered 2021-11-12: 10 mg via ORAL
  Filled 2021-11-12: qty 2

## 2021-11-12 MED ORDER — LEVOTHYROXINE SODIUM 100 MCG PO TABS
100.0000 ug | ORAL_TABLET | Freq: Every day | ORAL | Status: DC
  Administered 2021-11-13 – 2022-05-15 (×171): 100 ug via ORAL
  Filled 2021-11-12 (×185): qty 1

## 2021-11-12 NOTE — Progress Notes (Addendum)
PROGRESS NOTE    Courtney Gray  ITG:[id number] DOB: April 09, 1970 DOA: 11/01/2021 PCP: Monico Blitz, MD   Brief Narrative:  51 y.o. female with medical history significant of ESRD on HD TTS, recent bacteremia and hepatic abscess on long-term IV antibiotics, chronic HF PEF, PVD status post bilateral AKA, IIDM, morbid obesity, chronic sacral wound stage II, presented with increasing shortness of breath.   Patient underwent bilateral AKA recently including right AKA in May and left AKA on 7/5.  And patient was recently diagnosed with septic shock secondary to bacteremia and liver abscess, for which patient has been on cefepime and Flagyl and the last dose will be August 4.  Recently, since patient has bilateral AKA, she has had extreme difficulty to get access to outpatient HD center.  She never missed her dialysis however according to outpatient HD records, it takes at least 4 staff members and [facility] lift for patient to be seated in the dialysis chair.  Starting today, patient started to feel shortness of breath, no chest pain no swelling.  No cough no fever or chills. She is now awaiting placement for HD that takes stretcher and is hoping to find one locally.   Requested IR (still has splenic area drain) and ID (missed office appointment 7/28) to evaluate  Assessment & Plan:   Active Problems:   End stage renal disease ([place])   Hypertension   Decubitus ulcer of sacral region, stage 2 (HCC)   Type 2 diabetes mellitus with hyperlipidemia (HCC)   Bacteremia   PVD (peripheral vascular disease) (HCC)   Class 3 obesity (HCC)  Assessment and Plan:  Acute on chronic HFpEF with chronic hypoxemic respiratory failure -Secondary to uncontrolled hypertension, clinical has signs of mild fluid overload at presentation .Imdur was started at presentation Volume status improved with HD    HTN emergency -BP has improved but still mildly uncontrolled and fluctuating.  Continue isosorbide and metoprolol.    ESRD on HD -Admitting physician discussed with ED physician and case manager, likely patient will need long-term dialysis plan as currently, increasingly, outpatient HD center had hard time to accommodate patient's dialysis.  -Ongoing HD as per nephrology team.  Their input appreciated. -Discussed in detail with TOC during morning rounds and they are exploring options, will be difficult to place. -Patient reports that she recently moved from [place] to be with her daughter in [place].  They insist on finding a place in [place].  Daughter does not have a car even to drive if she is placed far away.  Advised them that [facility] priority will be to find placement locally.  However also advised them that given multiple barriers, they may have to consider alternate/distant options.   Anemia of CKD -Iron studies ordered per nephrology, but patient refusing lab work No CBC since 8/4.  Hopefully some labs across HD today.  Hemoglobin prior to that was 8.4, 9 -Nephrology starting IV iron weekly.   Constipation -Bisacodyl suppository as MiraLAX does not work well for her -Patient has refused suppository as well as enema -Adjusted bowel regimen.   Nausea/vomiting -Resolved.   IIDM -Cont SSI as needed -Reasonably controlled   Body mass index is 62.73 kg/m./Morbid obesity -Recommend diet/lifestyle modification   Stage II sacral wound -Chronic, WOC consulted  Pressure Injury 09/11/21 Buttocks Right Stage 2 -  Partial thickness loss of dermis presenting as a shallow open injury with a red, pink wound bed without slough. pink (Active)  09/11/21 0000  Location: Buttocks  Location Orientation: Right  Staging: Stage 2 -  Partial thickness loss of dermis presenting as a shallow open injury with a red, pink wound bed without slough.  Wound Description (Comments): pink  Present on Admission: Yes     Pressure Injury 11/12/21 Hip Right Unstageable - Full thickness tissue loss in which the base of the  injury is covered by slough (yellow, tan, gray, green or brown) and/or eschar (tan, brown or black) in the wound bed. 90% covered with slough (Active)  11/12/21 0800  Location: Hip  Location Orientation: Right  Staging: Unstageable - Full thickness tissue loss in which the base of the injury is covered by slough (yellow, tan, gray, green or brown) and/or eschar (tan, brown or black) in the wound bed.  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 On recent admit, developed septic shock from bacteroides thetaiotaomicron bacteremia with septic emboli to spleen, initially on anticoagulation which was later held as splenic infarct was determined to be infectious and not blood clot -Pt receiving IV cefepime on HD and flagyl, completed 8/4 -Abdominal drain in place,?  Management.  Consulted IR to evaluate.  Did not see a drain management clinic appointment.  Also, she may not be able to make it to the drain clinic anytime soon.  IR to evaluate and see if she needs to continue with the drain or if this can be removed. Patient missed ID clinic appointment on 7/28.  Requested ID to evaluate while she is here as well.   PAD -Pt s/p B AKA for gangrene with plan to f/u with vascular surgery in 1 month (with Dr. Stanford Breed on 11/21/2021 for staple removal) -Pt was s/p L middle finger amputation for gangrene at recent admit   Hyperlipidemia: Continue atorvastatin.  Chronic respiratory failure with hypoxia: Patient reports that she uses 4 L/min home oxygen PTA.     DVT prophylaxis:Heparin Code Status: Full Family Communication: Daughter at bedside. Disposition Plan:  Status is: Inpatient Remains inpatient appropriate because: IV medications and need to arrange outpatient safe discharge disposition including regular  hemodialysis.     Consultants:  Nephrology Infectious disease Interventional radiology   Procedures:  HD    Antimicrobials:  Anti-infectives (From admission, onward)    Start     Dose/Rate Route Frequency Ordered Stop   11/04/21 1800  ceFEPIme (MAXIPIME) 2 g in sodium chloride 0.9 % 100 mL IVPB        2 g 200 mL/hr over 30 Minutes Intravenous Every M-W-F (1800) 11/03/21 5427 11/08/21 2359   11/04/21 1200  ceFEPIme (MAXIPIME) 2 g in sodium chloride 0.9 % 100 mL IVPB  Status:  Discontinued        2 g 200 mL/hr over 30 Minutes Intravenous Every M-W-F (Hemodialysis) 11/02/21 0941 11/03/21 0952   11/02/21 1800  ceFEPIme (MAXIPIME) 2 g in sodium chloride 0.9 % 100 mL IVPB        2 g 200 mL/hr over 30 Minutes Intravenous  Once 11/02/21 0941 11/02/21 1812   11/02/21 1200  ceFEPIme (MAXIPIME) 2 g in sodium chloride 0.9 % 100 mL IVPB  Status:  Discontinued        2 g 200 mL/hr over 30 Minutes Intravenous Every T-Th-Sa (Hemodialysis) 11/01/21 1934 11/02/21 0941   11/01/21 2200  metroNIDAZOLE (FLAGYL) tablet 500 mg        500 mg Oral Every  12 hours 11/01/21 1827 11/08/21 2225   11/01/21 1945  ceFEPIme (MAXIPIME) 1 g in sodium chloride 0.9 % 100 mL IVPB        1 g 200 mL/hr over 30 Minutes Intravenous  Once 11/01/21 1933 11/01/21 2032   11/01/21 1730  ceFEPIme (MAXIPIME) 1 g in sodium chloride 0.9 % 100 mL IVPB  Status:  Discontinued        1 g 200 mL/hr over 30 Minutes Intravenous Every 24 hours 11/01/21 1720 11/01/21 1933         Subjective: Constipation, above meds not working.  No abdominal pain, nausea or vomiting reported.  No other complaints.  Feels that the bed is uncomfortable.  Overnight, lost IV access, pain meds were switched to oral.  Per RN, no indication for IV access and if needed, will have to consult IR for central line placement.  Objective: Vitals:   11/11/21 1724 11/11/21 2031 11/12/21 0531 11/12/21 0953  BP:  118/66 (!) 153/86 (!) 174/77  Pulse:  87 89 69   Resp:  20 16 18   Temp:  98.7 F (37.1 C) 98.8 F (37.1 C) 98 F (36.7 C)  TempSrc:  Oral Oral   SpO2:  100% 100% 100%  Weight: (!) 171 kg     Height:        Intake/Output Summary (Last 24 hours) at 11/12/2021 1629 Last data filed at 11/12/2021 1300 Gross per 24 hour  Intake 540 ml  Output 23.7 ml  Net 516.3 ml   Filed Weights   11/08/21 0800 11/08/21 1155 11/11/21 1724  Weight: (!) 181 kg (!) 178 kg (!) 171 kg    Examination:  General exam: Young female, moderately built and very morbidly obese sitting up comfortably in bed without distress.  Daughter at bedside. Respiratory system: Clear to auscultation.  No increased work of breathing.  Has [place] Cardiovascular system: S1 and S2 heard, RRR.  No JVD, murmurs.  Not on telemetry. Gastrointestinal system: Abdomen is obese, soft and nontender.  Normal bowel sounds heard.  Left upper quadrant drain with minimal drainage and bag. Central nervous system: Alert and oriented.  No focal neurological deficits. Extremities: Bilateral AKA, healed stumps. Psychiatry: Pleasant and appropriate affect.    Data Reviewed: I have personally reviewed following labs and imaging studies  CBC: Recent Labs  Lab 11/06/21 1805 11/08/21 0759 11/11/21 1437  WBC 10.3 10.0 11.3*  HGB 9.0* 8.4* 8.9*  HCT 29.0* 29.0* 29.2*  MCV 95.4 96.3 96.1  PLT 234 226 440   Basic Metabolic Panel: Recent Labs  Lab 11/06/21 0403 11/08/21 0758 11/11/21 1437  NA 133* 135 134*  K 5.2* 3.5 3.3*  CL 99 97* 97*  CO2 22 29 27   GLUCOSE 82 105* 117*  BUN 24* 18 30*  CREATININE 5.32* 4.90* 6.79*  CALCIUM 8.8* 8.5* 8.3*  PHOS  --  3.5 4.2   GFR: Estimated Creatinine Clearance: 15.9 mL/min (A) (by C-G formula based on SCr of 6.79 mg/dL (H)). Liver Function Tests: Recent Labs  Lab 11/06/21 0403 11/08/21 0758 11/11/21 1437  AST 28  --   --   ALT 7  --   --   ALKPHOS 62  --   --   BILITOT 0.9  --   --   PROT 6.9  --   --   ALBUMIN 2.2* 2.0* 2.1*     CBG: Recent Labs  Lab 11/11/21 1113 11/11/21 1818 11/11/21 2201 11/12/21 0722 11/12/21 1117  GLUCAP 123* 82 196*  130* 136*    Anemia Panel: No results for input(s): "VITAMINB12", "FOLATE", "FERRITIN", "TIBC", "IRON", "RETICCTPCT" in the last 72 hours.   No results found for this or any previous visit (from the past 240 hour(s)).       Radiology Studies: No results found.      Scheduled Meds:  atorvastatin  10 mg Oral Daily   calcium carbonate  200 mg of elemental calcium Oral TID WC   darbepoetin (ARANESP) injection - DIALYSIS  100 mcg Intravenous Q Fri-HD   docusate sodium  100 mg Oral BID   feeding supplement (NEPRO CARB STEADY)  237 mL Oral BID BM   gabapentin  100 mg Oral BID   Gerhardt's butt cream   Topical TID   heparin  5,000 Units Subcutaneous Q12H   insulin aspart  0-5 Units Subcutaneous QHS   insulin aspart  0-6 Units Subcutaneous TID WC   isosorbide mononitrate  30 mg Oral Daily   leptospermum manuka honey  1 Application Topical Daily   [START ON 11/13/2021] levothyroxine  100 mcg Oral Q0600   metoprolol tartrate  25 mg Oral BID   mouth rinse  15 mL Mouth Rinse 4 times per day   pantoprazole  40 mg Oral Daily   polyethylene glycol  17 g Oral Daily   Continuous Infusions:  ferric gluconate (FERRLECIT) IVPB       LOS: 11 days    Vernell Leep, MD,  FACP, [facility], [facility], [facility] (Care Management Physician Certified) [place]  To contact the attending provider between 7A-7P or the covering provider during after hours 7P-7A, please log into the web site [email or web address] and access using universal [place] password for that web site. If you do not have the password, please call the hospital operator.

## 2021-11-12 NOTE — Progress Notes (Signed)
Physical Therapy Treatment Patient Details Name: Courtney Gray MRN: [id number] DOB: 10-05-1970 Today's Date: 11/12/2021   History of Present Illness Pt adm 7/28 with acute pulmonary edema. 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    PT Comments    Patient seen for transfer OOB to recliner with maxisky and bil amputee sling. Original plan to lift to bariatric wheelchair, but awaiting maintenance to put anti-tippers onto chair and therefore unsafe to use wheelchair until this is completed.  Patient tolerated being in the sling better today (less pain in left residual limb), however feeling pain in right posterior thigh wound despite air cushions in seat of chair. For comfort, pt preferring to lean to her left on left armrest. Educated on weightshifting off left hip every ten minutes (to at least neutral sitting if unable to tolerate leaning to her right side) and staying x 2 minutes to allow circulation to left hip/leg and avoid potential pressure injury to left side. Will plan on sitting up x 1 hour to limit pressure on wounds.    Recommendations for follow up therapy are one component of a multi-disciplinary discharge planning process, led by the attending physician.  Recommendations may be updated based on patient status, additional functional criteria and insurance authorization.  Follow Up Recommendations  PT at Long-term acute care hospital Can patient physically be transported by private vehicle: No   Assistance Recommended at Discharge Frequent or constant Supervision/Assistance  Patient can return home with the following Two people to help with walking and/or transfers;Two people to help with bathing/dressing/bathroom;Assist for transportation;Assistance with cooking/housework;Help with stairs or ramp for entrance   Equipment Recommendations  Wheelchair (measurements PT);Wheelchair cushion  (measurements PT);Other (comment);Hospital bed (Needs wide w/c with cushion and anti-tippers with ?offset wheels to reduce risk of tipping backwards)    Recommendations for Other Services       Precautions / Restrictions Precautions Precautions: Fall Precaution Comments: bil AKA, 3L O2 at baseline, LUQ drain Restrictions Weight Bearing Restrictions: Yes RLE Weight Bearing: Non weight bearing LLE Weight Bearing: Non weight bearing Other Position/Activity Restrictions: minimized pushing/pulling and weight through L hand, dressing now removed     Mobility  Bed Mobility Overal bed mobility: Needs Assistance Bed Mobility: Supine to Sit, Sit to Supine, Rolling Rolling: Max assist (with pad)   Supine to sit: Supervision Sit to supine: Supervision   General bed mobility comments: pull to long-sitting with HOB elevated without assist    Transfers Overall transfer level: Needs assistance Equipment used:  (maxisky) Transfers: Bed to chair/wheelchair/BSC             General transfer comment: lifted to recliner; watch placement of pad around and under L residual limb due to pain Transfer via Lift Equipment: Maxisky (with amputee lift pad)  Ambulation/Gait               General Gait Details: unable   Stairs             Wheelchair Mobility    Modified Rankin (Stroke Patients Only)       Balance Overall balance assessment: Needs assistance Sitting-balance support: No upper extremity supported Sitting balance-[name] Scale: Good Sitting balance - Comments: with long sitting in bed  Cognition Arousal/Alertness: Awake/alert Behavior During Therapy: WFL for tasks assessed/performed Overall Cognitive Status: Within Functional Limits for tasks assessed                                          Exercises Other Exercises Other Exercises: Lateral weight shift left and right with "walking"  backwards on buttocks to back of chair/seat x 4 reps    General Comments General comments (skin integrity, edema, etc.): VSS on O2      Pertinent Vitals/Pain Pain Assessment Pain Assessment: Faces Faces Pain Scale: Hurts a little bit Breathing: normal Negative Vocalization: none Facial Expression: smiling or inexpressive Body Language: relaxed Consolability: no need to console PAINAD Score: 0 Pain Location: LLE and posterior rt thigh Pain Descriptors / Indicators: Guarding    Home Living Family/patient expects to be discharged to:: Unsure Living Arrangements: Children Available Help at Discharge: Family;Available PRN/intermittently Type of Home: Apartment Home Access: Ramped entrance Entrance Stairs-Rails: None Entrance Stairs-Number of Steps: 2   Home Layout: One level Home Equipment: Hand held shower head;Shower seat;Cane - single point;Grab bars - tub/shower;Rollator (4 wheels);Hospital bed Additional Comments: lives with daughter    Prior Function            PT Goals (current goals can now be found in the care plan section) Acute Rehab PT Goals Patient Stated Goal: Pt wants to get up into a w/c Time For Goal Achievement: 11/17/21 Potential to Achieve Goals: Fair Progress towards PT goals: Progressing toward goals    Frequency    Min 2X/week      PT Plan Current plan remains appropriate    Co-evaluation              AM-PAC PT "6 Clicks" Mobility   Outcome Measure  Help needed turning from your back to your side while in a flat bed without using bedrails?: Total Help needed moving from lying on your back to sitting on the side of a flat bed without using bedrails?: Total Help needed moving to and from a bed to a chair (including a wheelchair)?: Total Help needed standing up from a chair using your arms (e.g., wheelchair or bedside chair)?: Total Help needed to walk in hospital room?: Total Help needed climbing 3-5 steps with a railing? : Total 6  Click Score: 6    End of Session Equipment Utilized During Treatment: Oxygen Activity Tolerance: Patient tolerated treatment well Patient left: with call bell/phone within reach;in chair;with chair alarm set Nurse Communication: Other (comment) (pt's bed on full rotation with pt's LUE caught in bed rail and pt's upper body leaning over rail on arrival; turned rotation down to level 1) PT Visit Diagnosis: Other abnormalities of gait and mobility (R26.89);Muscle weakness (generalized) (M62.81) Pain - Right/Left: Left Pain - part of body:  (residual limb)     Time: 8889-1694 PT Time Calculation (min) (ACUTE ONLY): 23 min  Charges:  $Therapeutic Activity: 23-37 mins                      Arby Barrette, PT Acute Rehabilitation Services  Office [phone]    [name] 11/12/2021, 12:07 PM

## 2021-11-12 NOTE — Progress Notes (Signed)
Contacted Mar[facility] Craft with [facility] to obtain update on pt's referral. Maudie Mercury has not received a determination from MD as of yet. Kim to f/u with clinic.    Melven Sartorius Renal Navigator [phone]

## 2021-11-12 NOTE — Progress Notes (Signed)
[facility] Progress Note   Subjective:  Seen in room. Daughter visiting. No new complaints. Completed dialysis yesterday - no issues. Net UF 1L    Objective Vitals:   11/11/21 1724 11/11/21 2031 11/12/21 0531 11/12/21 0953  BP:  118/66 (!) 153/86 (!) 174/77  Pulse:  87 89 69  Resp:  20 16 18   Temp:  98.7 F (37.1 C) 98.8 F (37.1 C) 98 F (36.7 C)  TempSrc:  Oral Oral   SpO2:  100% 100% 100%  Weight: (!) 171 kg     Height:       Physical Exam General: Chronically ill appearing woman, obese. Nasal O2 in place. Heart: RRR; no murmur Lungs: CTAB; reduced air movement throughout Abdomen: soft Extremities: bilateral AKA; 1+ LUE edema Dialysis Access: [facility]  Additional Objective Labs: Basic Metabolic Panel: Recent Labs  Lab 11/06/21 0403 11/08/21 0758 11/11/21 1437  NA 133* 135 134*  K 5.2* 3.5 3.3*  CL 99 97* 97*  CO2 22 29 27   GLUCOSE 82 105* 117*  BUN 24* 18 30*  CREATININE 5.32* 4.90* 6.79*  CALCIUM 8.8* 8.5* 8.3*  PHOS  --  3.5 4.2    Liver Function Tests: Recent Labs  Lab 11/06/21 0403 11/08/21 0758 11/11/21 1437  AST 28  --   --   ALT 7  --   --   ALKPHOS 62  --   --   BILITOT 0.9  --   --   PROT 6.9  --   --   ALBUMIN 2.2* 2.0* 2.1*   \ CBC: Recent Labs  Lab 11/06/21 1805 11/08/21 0759 11/11/21 1437  WBC 10.3 10.0 11.3*  HGB 9.0* 8.4* 8.9*  HCT 29.0* 29.0* 29.2*  MCV 95.4 96.3 96.1  PLT 234 226 225    Medications:  ferric gluconate (FERRLECIT) IVPB      atorvastatin  10 mg Oral Daily   calcium carbonate  200 mg of elemental calcium Oral TID WC   darbepoetin (ARANESP) injection - DIALYSIS  100 mcg Intravenous Q Fri-HD   docusate sodium  100 mg Oral BID   feeding supplement (NEPRO CARB STEADY)  237 mL Oral BID BM   gabapentin  100 mg Oral BID   Gerhardt's butt cream   Topical TID   heparin  5,000 Units Subcutaneous Q12H   insulin aspart  0-5 Units Subcutaneous QHS   insulin aspart  0-6 Units Subcutaneous TID WC    isosorbide mononitrate  30 mg Oral Daily   leptospermum manuka honey  1 Application Topical Daily   [START ON 11/13/2021] levothyroxine  100 mcg Oral Q0600   metoprolol tartrate  25 mg Oral BID   mouth rinse  15 mL Mouth Rinse 4 times per day   pantoprazole  40 mg Oral Daily   polyethylene glycol  17 g Oral Daily    Dialysis Orders: MWF - [place] ---?transferring to [facility] 4hrs, BFR 400, DFR 600,  AVF, EDW 146kg, 3K/ 2Ca - Heparin 2500 units with HD - Hectorol 45mcg IV qHD, held as of 10/30/21     Assessment/Plan: ESRD - on HD MWF. Next HD 8/9. Hypertension/volume - BP variable, UF as able with HD.  Metoprolol 25mg  BID started 11/04/21. Last CXR with B effusions/pulm edema. Weights seem way off - up 25kg from admit? 4L UFG as tolerated. Anemia of CKD - Hgb down to 8.9, transferrin too low to calculate tsat. Will start IV iron weekly for now since she is off IV  abx. Aranesp q Friday.  Secondary Hyperparathyroidism - Ca/Phos to goal. Hectorol on hold, getting Tums 1000mg  TID as binder -> reduced dose to 500mg  TID.  Nutrition - Renal diet, have added supplements. Alb very low. Dispo - Very difficulty situation. Patient required 4 staff members and 2 hoyer pads for transfer into HD chair as OP. Not staying on HD for full treatment d/t pain for multiple decubitus ulcers. This is a major safety concern for staff and patient in the outpatient center. Also concern for inability for ulcers to heal properly d/t frequent and rigorous transfers that are required. Need to determine new placement at a facility where she can receive HD in the bed-more likley a LTAC if possible.   Decubs-  abx and local wound care Recent septic shock 2/2 bacteremia/liver abscess - Now s/p course of IV cefepime as of 11/08/21.  [name] PA-C [facility] 11/12/2021,10:21 AM

## 2021-11-12 NOTE — Progress Notes (Addendum)
Physical Therapy Treatment Patient Details Name: Courtney Gray MRN: [id number] DOB: October 15, 1970 Today's Date: 11/12/2021   History of Present Illness Pt adm 7/28 with acute pulmonary edema. 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    PT Comments    Patient seen for maxisky transfer from recliner to bed with use of bil amputee sling. Patient and daughter report she did her weight-shifting left and right while up x 1 hour. Patient reports pain in left LE and posterior rt thigh remains the same. Once in bed, returned bed to rotation setting (level 1).     Recommendations for follow up therapy are one component of a multi-disciplinary discharge planning process, led by the attending physician.  Recommendations may be updated based on patient status, additional functional criteria and insurance authorization.  Follow Up Recommendations  Skilled nursing-short term rehab (<3 hours/day) Can patient physically be transported by private vehicle: No   Assistance Recommended at Discharge Frequent or constant Supervision/Assistance  Patient can return home with the following Two people to help with walking and/or transfers;Two people to help with bathing/dressing/bathroom;Assist for transportation;Assistance with cooking/housework;Help with stairs or ramp for entrance   Equipment Recommendations  Wheelchair (measurements PT);Wheelchair cushion (measurements PT);Other (comment);Hospital bed (Needs wide w/c with cushion and anti-tippers with ?offset wheels to reduce risk of tipping backwards)    Recommendations for Other Services       Precautions / Restrictions Precautions Precautions: Fall Precaution Comments: bil AKA, 3L O2 at baseline, LUQ drain Restrictions Weight Bearing Restrictions: Yes RLE Weight Bearing: Non weight bearing LLE Weight Bearing: Non weight bearing Other Position/Activity Restrictions:  minimized pushing/pulling and weight through L hand, dressing now removed     Mobility  Bed Mobility Overal bed mobility: Needs Assistance Bed Mobility: Supine to Sit, Sit to Supine Rolling: Max assist (with pad)   Supine to sit: Min assist Sit to supine: Supervision   General bed mobility comments: pull to long-sitting with HOB elevated with HHA; once in sitting able to pull out the sling    Transfers Overall transfer level: Needs assistance Equipment used:  (maxisky) Transfers: Bed to chair/wheelchair/BSC             General transfer comment: lifted back to bed after 1 hour; watch placement of pad around and under L residual limb due to pain Transfer via Lift Equipment: Maxisky (with amputee lift pad)  Ambulation/Gait               General Gait Details: unable   Stairs             Wheelchair Mobility    Modified Rankin (Stroke Patients Only)       Balance Overall balance assessment: Needs assistance Sitting-balance support: No upper extremity supported Sitting balance-[name] Scale: Good Sitting balance - Comments: with long sitting in bed                                    Cognition Arousal/Alertness: Awake/alert Behavior During Therapy: WFL for tasks assessed/performed Overall Cognitive Status: Within Functional Limits for tasks assessed                                          Exercises Other Exercises Other Exercises: Lateral  weight shift left and right with "walking" backwards on buttocks to back of chair/seat x 4 reps    General Comments General comments (skin integrity, edema, etc.): VSS on O2      Pertinent Vitals/Pain Pain Assessment Pain Assessment: Faces Faces Pain Scale: Hurts a little bit Breathing: normal Negative Vocalization: none Facial Expression: smiling or inexpressive Body Language: relaxed Consolability: no need to console PAINAD Score: 0 Pain Location: LLE and posterior rt  thigh Pain Descriptors / Indicators: Guarding Pain Intervention(s): Monitored during session, Repositioned    Home Living Family/patient expects to be discharged to:: Unsure Living Arrangements: Children Available Help at Discharge: Family;Available PRN/intermittently Type of Home: Apartment Home Access: Ramped entrance Entrance Stairs-Rails: None Entrance Stairs-Number of Steps: 2   Home Layout: One level Home Equipment: Hand held shower head;Shower seat;Cane - single point;Grab bars - tub/shower;Rollator (4 wheels);Hospital bed Additional Comments: lives with daughter    Prior Function            PT Goals (current goals can now be found in the care plan section) Acute Rehab PT Goals Patient Stated Goal: Pt wants to get up into a w/c Time For Goal Achievement: 11/17/21 Potential to Achieve Goals: Fair Progress towards PT goals: Progressing toward goals    Frequency    Min 2X/week      PT Plan Current plan remains appropriate    Co-evaluation              AM-PAC PT "6 Clicks" Mobility   Outcome Measure  Help needed turning from your back to your side while in a flat bed without using bedrails?: Total Help needed moving from lying on your back to sitting on the side of a flat bed without using bedrails?: Total Help needed moving to and from a bed to a chair (including a wheelchair)?: Total Help needed standing up from a chair using your arms (e.g., wheelchair or bedside chair)?: Total Help needed to walk in hospital room?: Total Help needed climbing 3-5 steps with a railing? : Total 6 Click Score: 6    End of Session Equipment Utilized During Treatment: Oxygen Activity Tolerance: Patient tolerated treatment well Patient left: with call bell/phone within reach;in chair;with chair alarm set Nurse Communication: Mobility status PT Visit Diagnosis: Other abnormalities of gait and mobility (R26.89);Muscle weakness (generalized) (M62.81) Pain - Right/Left:  Left Pain - part of body:  (residual limb)     Time: 0076-2263 PT Time Calculation (min) (ACUTE ONLY): 19 min  Charges:  $Therapeutic Activity: 8-22 mins                      Arby Barrette, PT Acute Rehabilitation Services  Office [phone]    Courtney Gray 11/12/2021, 2:42 PM

## 2021-11-12 NOTE — TOC Progression Note (Signed)
Transition of Care [facility]) - Initial/Assessment Note    Patient Details  Name: Courtney Gray MRN: [id number] Date of Birth: 03/12/1971  [facility]) CM/SW Contact:    Milinda Antis, [place] Phone Number: 11/12/2021, 2:23 PM  Clinical Narrative:                 CSW met with patient and daughter to update on discharge planning status.  CSW also presented in [facility] length of stay meeting with [facility] leadership and informed of difficulty with discharge.    Expected Discharge Plan: Long Term Nursing Home Barriers to Discharge: No SNF bed, Transportation, Waiting for outpatient dialysis   Patient Goals and CMS Choice Patient states their goals for this hospitalization and ongoing recovery are:: To be at a facility close to her daughter      Expected Discharge Plan and Services Expected Discharge Plan: [place]       Living arrangements for the past 2 months: Single Family Home                                      Prior Living Arrangements/Services Living arrangements for the past 2 months: Single Family Home Lives with:: Adult Children Patient language and need for interpreter reviewed:: Yes        Need for Family Participation in Patient Care: Yes (Comment) Care giver support system in place?: No (comment)   Criminal Activity/Legal Involvement Pertinent to Current Situation/Hospitalization: No - Comment as needed  Activities of Daily Living Home Assistive Devices/Equipment: [employer] fast streamer ADL Screening (condition at time of admission) Patient's cognitive ability adequate to safely complete daily activities?: No Is the patient deaf or have difficulty hearing?: No Does the patient have difficulty seeing, even when wearing glasses/contacts?: No Does the patient have difficulty concentrating, remembering, or making decisions?: No Patient able to express need for assistance with ADLs?: Yes Does the patient have difficulty dressing or bathing?:  Yes Independently performs ADLs?: No Does the patient have difficulty walking or climbing stairs?: Yes Weakness of Legs: Both Weakness of Arms/Hands: Both  Permission Sought/Granted Permission sought to share information with : Other (comment) Permission granted to share information with : Yes, Verbal Permission Granted  Share Information with NAME: Trachtenberg,antoinette (Daughter)   [phone]  Permission granted to share info w AGENCY: SNF        Emotional Assessment Appearance:: Appears older than stated age Attitude/Demeanor/Rapport: Engaged Affect (typically observed): Tearful/Crying, Accepting Orientation: : Oriented to Situation, Oriented to  Time, Oriented to Place, Oriented to Self Alcohol / Substance Use: Not Applicable Psych Involvement: No (comment)  Admission diagnosis:  End stage renal disease ([place]) [N18.6] Fluid overload [E87.70] Obesity with serious comorbidity, unspecified classification, unspecified obesity type [E66.9] Pressure injury of right buttock, stage 2 (HCC) [L89.312] Patient Active Problem List   Diagnosis Date Noted   Hypertension    Type 2 diabetes mellitus with hyperlipidemia ([place])    Decubitus ulcer of sacral region, stage 2 ([place])    Class 3 obesity ([place])    Bacteremia    PVD (peripheral vascular disease) ([place])    Hepatic abscess    Pressure injury of skin 09/13/2021   Hx of AKA (above knee amputation), right ([place])    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   Lactic acidosis 09/10/2021   Mixed diabetic hyperlipidemia associated with type 2 diabetes mellitus ([place]) 09/10/2021   Coronary artery disease involving native coronary  artery of native heart without angina pectoris 09/10/2021   Elevated troponin level not due myocardial infarction 09/10/2021   Hypotension 09/10/2021   Dry gangrene (HCC)    Pain 08/03/2021   Ischemic foot pain at rest 08/03/2021   Hypoglycemia 07/10/2021   Volume overload 07/09/2021   Leukocytosis 06/11/2021   Acute gout  06/11/2021   Hyperkalemia 05/21/2021   Hemorrhoids    Hyperlipidemia 05/18/2021   Rectal bleeding 05/17/2021   Morbid obesity with BMI of 60.0-69.9, adult ([place]) 05/17/2021   NSTEMI (non-ST elevated myocardial infarction) ([place]) 05/16/2021   Essential hypertension 09/01/2020   End stage renal disease ([place]) 09/01/2020   Chronic respiratory failure with hypoxia ([place]) 09/01/2020   Hypertensive urgency 11/28/2019   Uncontrolled type 2 diabetes mellitus with hypoglycemia, with long-term current use of insulin ([place]) 11/28/2019   Anemia of chronic disease 11/28/2019   PCP:  Monico Blitz, MD Pharmacy:   [place], [place] [address] [place] [address] Phone: [phone] Fax: [phone]  [facility] [address] [place] [address] Phone: [phone] Fax: [phone]  [place], [place] - [address] [place] [address] Phone: [phone] Fax: [phone]     Social Determinants of Health ([place]) Interventions    Readmission Risk Interventions    09/16/2021    4:36 PM 08/28/2021   11:50 AM 07/12/2021   12:38 PM  Readmission Risk Prevention Plan  Transportation Screening Complete Complete Complete  Medication Review (RN Care Manager)  Complete Complete  PCP or Specialist appointment within 3-5 days of discharge  Complete Complete  HRI or Home Care Consult Complete Complete Complete  SW Recovery Care/Counseling Consult Complete Complete Complete  Palliative Care Screening  Not Applicable Not Applicable  Skilled Nursing Facility Complete Not Applicable Complete

## 2021-11-13 ENCOUNTER — Inpatient Hospital Stay (HOSPITAL_COMMUNITY): Payer: 59

## 2021-11-13 DIAGNOSIS — Z992 Dependence on renal dialysis: Secondary | ICD-10-CM | POA: Diagnosis not present

## 2021-11-13 DIAGNOSIS — I5023 Acute on chronic systolic (congestive) heart failure: Secondary | ICD-10-CM | POA: Diagnosis not present

## 2021-11-13 DIAGNOSIS — N186 End stage renal disease: Secondary | ICD-10-CM | POA: Diagnosis not present

## 2021-11-13 LAB — RENAL FUNCTION PANEL
Albumin: 2.2 g/dL — ABNORMAL LOW (ref 3.5–5.0)
Anion gap: 10 (ref 5–15)
BUN: 27 mg/dL — ABNORMAL HIGH (ref 6–20)
CO2: 27 mmol/L (ref 22–32)
Calcium: 8.4 mg/dL — ABNORMAL LOW (ref 8.9–10.3)
Chloride: 96 mmol/L — ABNORMAL LOW (ref 98–111)
Creatinine, Ser: 6.32 mg/dL — ABNORMAL HIGH (ref 0.44–1.00)
GFR, Estimated: 7 mL/min — ABNORMAL LOW (ref >60)
Glucose, Bld: 116 mg/dL — ABNORMAL HIGH (ref 70–99)
Phosphorus: 5.2 mg/dL — ABNORMAL HIGH (ref 2.5–4.6)
Potassium: 3.1 mmol/L — ABNORMAL LOW (ref 3.5–5.1)
Sodium: 133 mmol/L — ABNORMAL LOW (ref 135–145)

## 2021-11-13 LAB — CBC
HCT: 28.4 % — ABNORMAL LOW (ref 36.0–46.0)
Hemoglobin: 8.6 g/dL — ABNORMAL LOW (ref 12.0–15.0)
MCH: 29.6 pg (ref 26.0–34.0)
MCHC: 30.3 g/dL (ref 30.0–36.0)
MCV: 97.6 fL (ref 80.0–100.0)
Platelets: 204 10*3/uL (ref 150–400)
RBC: 2.91 MIL/uL — ABNORMAL LOW (ref 3.87–5.11)
RDW: 17.1 % — ABNORMAL HIGH (ref 11.5–15.5)
WBC: 11.6 10*3/uL — ABNORMAL HIGH (ref 4.0–10.5)
nRBC: 0 % (ref 0.0–0.2)

## 2021-11-13 LAB — GLUCOSE, CAPILLARY
Glucose-Capillary: 114 mg/dL — ABNORMAL HIGH (ref 70–99)
Glucose-Capillary: 80 mg/dL (ref 70–99)
Glucose-Capillary: 112 mg/dL — ABNORMAL HIGH (ref 70–99)

## 2021-11-13 MED ORDER — ALTEPLASE 2 MG IJ SOLR: 2.0000 mg | Freq: Once | INTRAMUSCULAR | Status: DC | PRN

## 2021-11-13 MED ORDER — LIDOCAINE-PRILOCAINE 2.5-2.5 % EX CREA: 1.0000 | TOPICAL_CREAM | CUTANEOUS | Status: DC | PRN

## 2021-11-13 MED ORDER — IOHEXOL 300 MG/ML  SOLN
100.0000 mL | Freq: Once | INTRAMUSCULAR | Status: AC | PRN
Start: 2021-11-13 — End: 2021-11-13
  Administered 2021-11-13: 100 mL via INTRAVENOUS

## 2021-11-13 MED ORDER — HEPARIN SODIUM (PORCINE) 1000 UNIT/ML DIALYSIS
1000.0000 [IU] | INTRAMUSCULAR | Status: DC | PRN
  Administered 2021-11-13: 1000 [IU]
  Filled 2021-11-13 (×2): qty 1

## 2021-11-13 MED ORDER — PENTAFLUOROPROP-TETRAFLUOROETH EX AERO: 1.0000 | INHALATION_SPRAY | CUTANEOUS | Status: DC | PRN

## 2021-11-13 MED ORDER — HEPARIN SODIUM (PORCINE) 1000 UNIT/ML DIALYSIS
20.0000 [IU]/kg | INTRAMUSCULAR | Status: DC | PRN
  Filled 2021-11-13: qty 4

## 2021-11-13 MED ORDER — ANTICOAGULANT SODIUM CITRATE 4% (200MG/5ML) IV SOLN: 5.0000 mL | Status: DC | PRN

## 2021-11-13 MED ORDER — LIDOCAINE HCL (PF) 1 % IJ SOLN: 5.0000 mL | INTRAMUSCULAR | Status: DC | PRN

## 2021-11-13 NOTE — Progress Notes (Signed)
TRH night coverage note:  Called by RN: CT AP ordered earlier in evening by Dr. Algis Liming due to pt having N/V.  IV finally placed for IV contrast.  RN reports Pt refusing to drink PO contrast however, says it has caused severe N/V in the past when she has tried to drink it.  Told RN to just document refusal and proceed with CT AP with IV but no PO contrast.

## 2021-11-13 NOTE — Consult Note (Signed)
[facility]                                                                                        Patient Identification: Patient Name: Courtney Gray MRN: [id number] [place] Date: 11/01/2021 12:06 PM Today's Date: 11/13/2021 Reason for consult:  Requesting provider:   Active Problems:   End stage renal disease ([place])   Hypertension   Type 2 diabetes mellitus with hyperlipidemia ([place])   Decubitus ulcer of sacral region, stage 2 ([place])   Class 3 obesity ([place])   Bacteremia   PVD (peripheral vascular disease) ([place])   Antibiotics: None   Lines/Hardware: RT AV Fistula , Left IJ HD Catheter placed 7/14 , LLQ drain   Assessment # Hepatic abscess, resolved  # Subcapsular splenic, resolved/Splenic infarcts: still has LLQ drainage catheter with minimal output # Bacteroides bacteremia ( thought to be CLABSI vs intraabdominal source)  # Moderate sized loculated Left pleural effusion: likely volume overload with ESRD status, low suspicion for infection   # ESRD on HD left IJ HD catheter with failed RUE AVF # B/L AKA  # Decubitus Ulcer ( unstageable to left buttock, unstageable PI to rt ischium,/post upper thigh): wound care noted on 8/7 reviewed   Recommendations  IR has been consulted for possible splenic drain removal  No indication of abtx at this time. ID will sign off. Please recall if needed.   Rest of the management as per the primary team. Please call with questions or concerns.  Thank you for the consult  Rosiland Oz, MD Infectious Disease Physician [facility] [address] [place], [place] [address] Phone: [phone]  Fax: [phone]  __________________________________________________________________________________________________________ HPI and Hospital Course: 51 Y O female with PMH of ESRD on HD, chronic CHFrEF,  PVD s/p bilateral AKA, DM2, Morbid obesity, chronic back decubitus ulcer, recent hospital admission for septic shock 2/2 liver abscess/subcapsular splenic fluid collection and bacteroides bacteremia ( thought to be CLABSI vs intraabdominal source, Rt IJ removed 7/11 due to persistent leukocytosis) s/p hepatic drain  6/25 ( Cx NG)and splenic drain placement 7/7 ( Cx MSSE), planned for cefepime and metronidazole for 4 weeks, EOT 8/4 who was sent from dialysis center 7/28 for SOB + patient cannot be accommodated at OP dialysis center due to her body habitus.   At ED afebrile, WBC 7.3 Labs unremarkable   ID called to assess given patient had missed ID appointment on 7/28.  CT abdomen/pelvis findings as below  ROS: all systems reviewed with pertinent positives and negatives as listed above   Past Medical History:  Diagnosis Date   Arthritis    Diabetes mellitus without complication ([place])    Dialysis complication, subsequent encounter    Gout    High cholesterol    Hypertension    Renal disorder    Past Surgical History:  Procedure Laterality Date   ABDOMINAL AORTOGRAM W/LOWER EXTREMITY N/A 08/07/2021   Procedure: ABDOMINAL AORTOGRAM W/LOWER EXTREMITY;  Surgeon: Broadus John, MD;  Location: [facility];  Service: Cardiovascular;  Laterality: N/A;   AMPUTATION Right 08/14/2021  Procedure: AMPUTATION ABOVE KNEE;  Surgeon: Marty Heck, MD;  Location: [facility];  Service: Vascular;  Laterality: Right;   AMPUTATION Left 09/23/2021   Procedure: AMPUTATION MIDDLE FINGER;  Surgeon: Sherilyn Cooter, MD;  Location: [facility];  Service: Orthopedics;  Laterality: Left;   AMPUTATION Left 10/09/2021   Procedure: LEFT ABOVE KNEE AMPUTATION;  Surgeon: Cherre Robins, MD;  Location: [facility] OR;  Service: Vascular;  Laterality: Left;   AV FISTULA PLACEMENT     x2, unsuccessful.    BUBBLE STUDY  09/27/2021   Procedure: BUBBLE STUDY;  Surgeon: Lelon Perla, MD;  Location: [facility];  Service:  Cardiovascular;;   IR FLUORO GUIDE CV LINE LEFT  12/14/2020   IR FLUORO GUIDE CV LINE LEFT  02/20/2021   IR FLUORO GUIDE CV LINE LEFT  09/11/2021   IR FLUORO GUIDE CV LINE LEFT  10/18/2021   IR FLUORO GUIDE CV LINE RIGHT  08/28/2020   IR FLUORO GUIDE CV LINE RIGHT  05/13/2021   IR FLUORO GUIDE CV LINE RIGHT  05/24/2021   IR PERC TUN PERIT CATH WO PORT S&I /IMAG  09/06/2020   IR PTA VENOUS EXCEPT DIALYSIS CIRCUIT  02/20/2021   IR PTA VENOUS EXCEPT DIALYSIS CIRCUIT  05/13/2021   IR REMOVAL TUN CV CATH W/O FL  05/24/2021   IR REMOVAL TUN CV CATH W/O FL  10/15/2021   IR US GUIDE VASC ACCESS LEFT  09/06/2020   IR US GUIDE VASC ACCESS LEFT  09/11/2021   IR US GUIDE VASC ACCESS LEFT  10/18/2021   IR US GUIDE VASC ACCESS RIGHT  05/24/2021   IR US GUIDE VASC ACCESS RIGHT  09/10/2021   IR VENO/JUGULAR RIGHT  09/10/2021   IR VENOCAVAGRAM SVC  02/20/2021   TEE WITHOUT CARDIOVERSION N/A 09/27/2021   Procedure: TRANSESOPHAGEAL ECHOCARDIOGRAM (TEE);  Surgeon: Lelon Perla, MD;  Location: [facility] END[facility];  Service: Cardiovascular;  Laterality: N/A;     Scheduled Meds:  atorvastatin  10 mg Oral Daily   calcium carbonate  200 mg of elemental calcium Oral TID WC   darbepoetin (ARANESP) injection - DIALYSIS  100 mcg Intravenous Q Fri-HD   docusate sodium  100 mg Oral BID   feeding supplement (NEPRO CARB STEADY)  237 mL Oral BID BM   gabapentin  100 mg Oral BID   Gerhardt's butt cream   Topical TID   heparin  5,000 Units Subcutaneous Q12H   insulin aspart  0-5 Units Subcutaneous QHS   insulin aspart  0-6 Units Subcutaneous TID WC   isosorbide mononitrate  30 mg Oral Daily   leptospermum manuka honey  1 Application Topical Daily   levothyroxine  100 mcg Oral Q0600   metoprolol tartrate  25 mg Oral BID   mouth rinse  15 mL Mouth Rinse 4 times per day   pantoprazole  40 mg Oral Daily   polyethylene glycol  17 g Oral BID   senna  1 tablet Oral Daily   Continuous Infusions:  ferric gluconate (FERRLECIT) IVPB     PRN  Meds:.acetaminophen, diphenhydrAMINE, HYDROmorphone, ondansetron, mouth rinse, oxyCODONE, promethazine (PHENERGAN) injection (IM or IVPB)  Allergies  Allergen Reactions   Benzonatate Other (See Comments)    Hallucinations   Hydralazine Other (See Comments)    Hallucinations   Amoxicillin Itching and Nausea And Vomiting   Clonidine Other (See Comments)    Altered mental state, insomnia    Chlorhexidine Itching    Patient reports it is the CHG baths that cause her  itching not the CHG used for caring for her IV/HD access.     Morphine Itching   Oxycodone Itching    Pt tolerated hospital admission 07/2021   Social History   Socioeconomic History   Marital status: Single    Spouse name: Not on file   Number of children: Not on file   Years of education: Not on file   Highest education level: Not on file  Occupational History   Not on file  Tobacco Use   Smoking status: Never   Smokeless tobacco: Never  Vaping Use   Vaping Use: Never used  Substance and Sexual Activity   Alcohol use: Never   Drug use: Never   Sexual activity: Not on file  Other Topics Concern   Not on file  Social History Narrative   Not on file   Social Determinants of Health   Financial Resource Strain: Not on file  Food Insecurity: Not on file  Transportation Needs: Not on file  Physical Activity: Not on file  Stress: Not on file  Social Connections: Not on file  Intimate Partner Violence: Not on file   Family History  Problem Relation Age of Onset   Diabetes Mother    Diabetes Father     Vitals BP (!) 143/62 (BP Location: Left Arm)   Pulse 70   Temp 97.7 F (36.5 C) (Oral)   Resp 19   Ht 5\' 5"  (1.651 m)   Wt (!) 161 kg   SpO2 97%   BMI 59.07 kg/m    Physical Exam Constitutional:  lying in the bed, morbidly obese and getting HD    Comments:   Cardiovascular:     Rate and Rhythm: Normal rate and regular rhythm.     Heart sounds:   Pulmonary:     Effort: Pulmonary effort is  normal on [place]    Comments: distant sounds   Abdominal:     Palpations: Abdomen is soft.     Tenderness: obese abdomen and non tender   Musculoskeletal:        General: Bilateral AKA with well healed scars b/l   Skin:    Comments: DU at the back ( not examined), Left IJ HD catheter+  Neurological:     General: awake, alert and oriented, grossly non focal   Psychiatric:        Mood and Affect: Mood normal.    Pertinent Microbiology Results for orders placed or performed during the hospital encounter of 10/26/21  Resp Panel by RT-PCR (Flu A&B, Covid) Anterior Nasal Swab     Status: None   Collection Time: 10/26/21 11:45 PM   Specimen: Anterior Nasal Swab  Result Value Ref Range Status   SARS Coronavirus 2 by RT PCR NEGATIVE NEGATIVE Final    Comment: (NOTE) SARS-CoV-2 target nucleic acids are NOT DETECTED.  The SARS-CoV-2 RNA is generally detectable in upper respiratory specimens during the acute phase of infection. The lowest concentration of SARS-CoV-2 viral copies this assay can detect is 138 copies/mL. A negative result does not preclude SARS-Cov-2 infection and should not be used as the sole basis for treatment or other patient management decisions. A negative result may occur with  improper specimen collection/handling, submission of specimen other than nasopharyngeal swab, presence of viral mutation(s) within the areas targeted by this assay, and inadequate number of viral copies(<138 copies/mL). A negative result must be combined with clinical observations, patient history, and epidemiological information. The expected result is Negative.  Fact Sheet for  Patients:  EntrepreneurPulse.com.au  Fact Sheet for Healthcare Providers:  IncredibleEmployment.be  This test is no t yet approved or cleared by the [place] FDA and  has been authorized for detection and/or diagnosis of SARS-CoV-2 by FDA under an Emergency Use  Authorization (EUA). This EUA will remain  in effect (meaning this test can be used) for the duration of the COVID-19 declaration under Section 564(b)(1) of the Act, 21 U.S.C.section 360bbb-3(b)(1), unless the authorization is terminated  or revoked sooner.       Influenza A by PCR NEGATIVE NEGATIVE Final   Influenza B by PCR NEGATIVE NEGATIVE Final    Comment: (NOTE) The Xpert Xpress SARS-CoV-2/FLU/RSV plus assay is intended as an aid in the diagnosis of influenza from Nasopharyngeal swab specimens and should not be used as a sole basis for treatment. Nasal washings and aspirates are unacceptable for Xpert Xpress SARS-CoV-2/FLU/RSV testing.  Fact Sheet for Patients: EntrepreneurPulse.com.au  Fact Sheet for Healthcare Providers: IncredibleEmployment.be  This test is not yet approved or cleared by the [place] FDA and has been authorized for detection and/or diagnosis of SARS-CoV-2 by FDA under an Emergency Use Authorization (EUA). This EUA will remain in effect (meaning this test can be used) for the duration of the COVID-19 declaration under Section 564(b)(1) of the Act, 21 U.S.C. section 360bbb-3(b)(1), unless the authorization is terminated or revoked.  Performed at [facility], [place] [address] [place], [place] [address]      Pertinent Lab seen by me:    Latest Ref Rng & Units 11/13/2021   10:13 AM 11/11/2021    2:37 PM 11/08/2021    7:59 AM  CBC  WBC 4.0 - 10.5 K/uL 11.6  11.3  10.0   Hemoglobin 12.0 - 15.0 g/dL 8.6  8.9  8.4   Hematocrit 36.0 - 46.0 % 28.4  29.2  29.0   Platelets 150 - 400 K/uL 204  225  226       Latest Ref Rng & Units 11/13/2021   10:13 AM 11/11/2021    2:37 PM 11/08/2021    7:58 AM  CMP  Glucose 70 - 99 mg/dL 116  117  105   BUN 6 - 20 mg/dL 27  30  18    Creatinine 0.44 - 1.00 mg/dL 6.32  6.79  4.90   Sodium 135 - 145 mmol/L 133  134  135   Potassium 3.5 - 5.1 mmol/L 3.1  3.3  3.5   Chloride 98 -  111 mmol/L 96  97  97   CO2 22 - 32 mmol/L 27  27  29    Calcium 8.9 - 10.3 mg/dL 8.4  8.3  8.5      Pertinent Imagings/Other Imagings Plain films and CT images have been personally visualized and interpreted; radiology reports have been reviewed. Decision making incorporated into the Impression / Recommendations.  CT ABDOMEN PELVIS W CONTRAST  Result Date: 11/13/2021 CLINICAL DATA:  Splenic abscess. EXAM: CT ABDOMEN AND PELVIS WITH CONTRAST TECHNIQUE: Multidetector CT imaging of the abdomen and pelvis was performed using the standard protocol following bolus administration of intravenous contrast. RADIATION DOSE REDUCTION: This exam was performed according to the departmental dose-optimization program which includes automated exposure control, adjustment of the mA and/or kV according to patient size and/or use of iterative reconstruction technique. CONTRAST:  149mL OMNIPAQUE IOHEXOL 300 MG/ML  SOLN COMPARISON:  October 07, 2021. FINDINGS: Lower chest: Moderate size loculated left pleural effusion is noted with associated subsegmental atelectasis. Hepatobiliary: No gallstones or  biliary dilatation is noted. Drainage catheter that was noted adjacent the left hepatic lobe on prior exam has been removed. No definite hepatic abnormality is noted currently. Pancreas: Unremarkable. No pancreatic ductal dilatation or surrounding inflammatory changes. Spleen: There has been interval placement of percutaneous drainage catheter seen in splenic fluid collection or abscess noted on prior exam. Fluid collection appears to be completely decompressed at this time. Adrenals/Urinary Tract: Adrenal glands appear normal. Severe bilateral renal atrophy is noted consistent with end-stage renal disease. No hydronephrosis or renal obstruction is noted. Urinary bladder is decompressed. Stomach/Bowel: Stomach is within normal limits. Appendix appears normal. No evidence of bowel wall thickening, distention, or inflammatory changes.  Vascular/Lymphatic: Aortic atherosclerosis. No enlarged abdominal or pelvic lymph nodes. Reproductive: Uterus and bilateral adnexa are unremarkable. Other: No abdominal wall hernia or abnormality. No abdominopelvic ascites. Musculoskeletal: No acute or significant osseous findings. IMPRESSION: Moderate size loculated left pleural effusion with associated subsegmental atelectasis. Interval placement of percutaneous drainage catheter into splenic fluid collection or abscess. This fluid collection appears to be completely decompressed at this time. Left hepatic drainage catheter noted on prior exam has been removed. Severe bilateral renal atrophy is noted consistent with end-stage renal disease. Aortic Atherosclerosis (ICD10-I70.0). Electronically Signed   By: Marijo Conception M.D.   On: 11/13/2021 08:52   DG Chest 1 View  Result Date: 11/01/2021 CLINICAL DATA:  End-stage renal disease, vascular access issue EXAM: CHEST  1 VIEW COMPARISON:  10/26/2021 FINDINGS: Pulmonary vascular congestion without frank interstitial edema. Moderate left and possible small right pleural effusion. Lingular and left lower lobe atelectasis. Cardiomegaly. Left IJ dual lumen dialysis catheter terminating at the cavoatrial junction. IMPRESSION: Cardiomegaly with pulmonary vascular congestion. No frank interstitial edema. Moderate left and small right pleural effusions. Electronically Signed   By: Julian Hy M.D.   On: 11/01/2021 17:48   DG Chest Port 1 View  Result Date: 10/26/2021 CLINICAL DATA:  Shortness of breath. EXAM: PORTABLE CHEST 1 VIEW COMPARISON:  10/04/2021 FINDINGS: Left central venous catheter with tip over the cavoatrial junction region. No pneumothorax. Diffuse cardiac enlargement. Mild vascular congestion with perihilar infiltrates suggesting edema. Bilateral pleural effusions, greater on the left. Atelectasis in the left base. Calcification of the aorta. IMPRESSION: Cardiac enlargement with pulmonary vascular  congestion, perihilar edema, and effusions, greater on the left. Electronically Signed   By: Lucienne Capers M.D.   On: 10/26/2021 23:48   IR Fluoro Guide CV Line Left  Result Date: 10/19/2021 CLINICAL DATA:  End-stage renal disease. Recent removal of right-sided dialysis catheter on 10/15/2021 due to infection. EXAM: TUNNELED CENTRAL VENOUS HEMODIALYSIS CATHETER PLACEMENT WITH ULTRASOUND AND FLUOROSCOPIC GUIDANCE ANESTHESIA/SEDATION: Moderate (conscious) sedation was employed during this procedure. A total of Versed 1.0 mg and Fentanyl 50 mcg was administered intravenously by radiology nursing. Moderate Sedation Time: 27 minutes. The patient's level of consciousness and vital signs were monitored continuously by radiology nursing throughout the procedure under my direct supervision. MEDICATIONS: 1 g IV vancomycin. FLUOROSCOPY: 2.0 minutes.  70.0 mGy. PROCEDURE: The procedure, risks, benefits, and alternatives were explained to the patient. Questions regarding the procedure were encouraged and answered. The patient understands and consents to the procedure. A timeout was performed prior to initiating the procedure. Ultrasound was performed of both right and left neck. The left neck and chest were prepped with chlorhexidine in a sterile fashion, and a sterile drape was applied covering the operative field. Maximum barrier sterile technique with sterile gowns and gloves were used for the procedure. Local anesthesia  was provided with 1% lidocaine. After creating a small venotomy incision, a 21 gauge needle was advanced into the left internal jugular vein under direct, real-time ultrasound guidance. Ultrasound image documentation was performed. After securing guidewire access, an 8 Fr dilator was placed. A J-wire was kinked to measure appropriate catheter length. A Palindrome tunneled hemodialysis catheter measuring 28 cm from tip to cuff was chosen for placement. This was tunneled in a retrograde fashion from the  chest wall to the venotomy incision. At the venotomy, serial dilatation was performed and a 15 Fr peel-away sheath was placed over a guidewire. The catheter was then placed through the sheath and the sheath removed. Final catheter positioning was confirmed and documented with a fluoroscopic spot image. The catheter was aspirated, flushed with saline, and injected with appropriate volume heparin dwells. The venotomy incision was closed with subcuticular 4-0 Vicryl. Dermabond was applied to the incision. The catheter exit site was secured with 0-Prolene retention sutures. COMPLICATIONS: None.  No pneumothorax. 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 On the left side, a small internal jugular vein is identified. A true external jugular vein is not identified. Tortuous collateral veins are present. Due to body habitus, neither subclavian vein is able to be visualized by ultrasound. An ultrasound image was recorded of the left internal jugular vein. The left internal jugular vein was accessed under ultrasound guidance with a 21 gauge needle and micropuncture set and access ultimately secured to allow tunneled dialysis catheter placement. After catheter placement, the new tip lies in the right atrium. The catheter aspirates normally and is ready for immediate use. IMPRESSION: 1. 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 2. The only vein in the neck which would allow dialysis catheter placement is the left internal jugular vein which was accessed for catheter placement today. This is likely the last tenable access in the neck. Neither subclavian vein is visible by ultrasound due to body habitus. 3. A new 28 cm tip to cuff length tunneled hemodialysis catheter was placed via the left internal jugular vein. The  catheter tip lies in the right atrium. The catheter is ready for immediate use. Electronically Signed   By: Aletta Edouard M.D.   On: 10/19/2021 08:26   IR US Guide Vasc Access Left  Result Date: 10/19/2021 CLINICAL DATA:  End-stage renal disease. Recent removal of right-sided dialysis catheter on 10/15/2021 due to infection. EXAM: TUNNELED CENTRAL VENOUS HEMODIALYSIS CATHETER PLACEMENT WITH ULTRASOUND AND FLUOROSCOPIC GUIDANCE ANESTHESIA/SEDATION: Moderate (conscious) sedation was employed during this procedure. A total of Versed 1.0 mg and Fentanyl 50 mcg was administered intravenously by radiology nursing. Moderate Sedation Time: 27 minutes. The patient's level of consciousness and vital signs were monitored continuously by radiology nursing throughout the procedure under my direct supervision. MEDICATIONS: 1 g IV vancomycin. FLUOROSCOPY: 2.0 minutes.  70.0 mGy. PROCEDURE: The procedure, risks, benefits, and alternatives were explained to the patient. Questions regarding the procedure were encouraged and answered. The patient understands and consents to the procedure. A timeout was performed prior to initiating the procedure. Ultrasound was performed of both right and left neck. The left neck and chest were prepped with chlorhexidine in a sterile fashion, and a sterile drape was applied covering the operative field. Maximum barrier sterile technique with sterile gowns and gloves were used for the procedure. Local anesthesia was provided  with 1% lidocaine. After creating a small venotomy incision, a 21 gauge needle was advanced into the left internal jugular vein under direct, real-time ultrasound guidance. Ultrasound image documentation was performed. After securing guidewire access, an 8 Fr dilator was placed. A J-wire was kinked to measure appropriate catheter length. A Palindrome tunneled hemodialysis catheter measuring 28 cm from tip to cuff was chosen for placement. This was tunneled in a retrograde  fashion from the chest wall to the venotomy incision. At the venotomy, serial dilatation was performed and a 15 Fr peel-away sheath was placed over a guidewire. The catheter was then placed through the sheath and the sheath removed. Final catheter positioning was confirmed and documented with a fluoroscopic spot image. The catheter was aspirated, flushed with saline, and injected with appropriate volume heparin dwells. The venotomy incision was closed with subcuticular 4-0 Vicryl. Dermabond was applied to the incision. The catheter exit site was secured with 0-Prolene retention sutures. COMPLICATIONS: None.  No pneumothorax. 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 On the left side, a small internal jugular vein is identified. A true external jugular vein is not identified. Tortuous collateral veins are present. Due to body habitus, neither subclavian vein is able to be visualized by ultrasound. An ultrasound image was recorded of the left internal jugular vein. The left internal jugular vein was accessed under ultrasound guidance with a 21 gauge needle and micropuncture set and access ultimately secured to allow tunneled dialysis catheter placement. After catheter placement, the new tip lies in the right atrium. The catheter aspirates normally and is ready for immediate use. IMPRESSION: 1. 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 2. The only vein in the neck which would allow dialysis catheter placement is the left internal jugular vein which was accessed for catheter placement today. This is likely the last tenable access in the neck. Neither subclavian vein is visible by ultrasound due to body habitus. 3. A new 28 cm tip to cuff length tunneled hemodialysis catheter was placed via the left internal  jugular vein. The catheter tip lies in the right atrium. The catheter is ready for immediate use. Electronically Signed   By: Aletta Edouard M.D.   On: 10/19/2021 08:26   IR Removal Tun Cv Cath W/O FL  Result Date: 10/15/2021 INDICATION: Request for tunneled dialysis catheter removal due to infection with replacement in 48 hours. EXAM: REMOVAL OF TUNNELED HEMODIALYSIS CATHETER MEDICATIONS: 10 mL 1 % lidocaine COMPLICATIONS: None immediate. PROCEDURE: Informed verbal consent was obtained from the patient following an explanation of the procedure, risks, benefits and alternatives to treatment. A time out was performed prior to the initiation of the procedure. Sterile technique was utilized including mask, sterile gloves, sterile drape, and hand hygiene. Betadine was used to prep the patient's right neck, chest and existing catheter. 1% lidocaine was injected around the catheter and the subcutaneous tunnel. The catheter was dissected out using curved hemostats until the cuff was freed from the surrounding fibrous sheath. The catheter was removed intact. Hemostasis was obtained with manual compression. A dressing was placed. The patient tolerated the procedure well without immediate post procedural complication. IMPRESSION: Successful removal of tunneled dialysis catheter. Read by: Narda Rutherford, AGNP-BC Electronically Signed   By: Corrie Mckusick D.O.   On: 10/15/2021 18:23     I spent 100 minutes for  this patient encounter including review of prior medical records/discussing diagnostics and treatment plan with the patient/family/coordinate care with primary/other specialits with greater than 50% of time in face to face encounter.   Electronically signed by:   Rosiland Oz, MD Infectious Disease Physician [facility] Pager: [phone]

## 2021-11-13 NOTE — Progress Notes (Signed)
Progress Note   Patient: Courtney Gray PZW:[id number] DOB: December 06, 1970 DOA: 11/01/2021     12 DOS: the patient was seen and examined on 11/13/2021   Brief hospital course: 51 y.o. female with medical history significant of ESRD on HD TTS, recent bacteremia and hepatic abscess on long-term IV antibiotics, chronic HF PEF, PVD status post bilateral AKA, IIDM, morbid obesity, chronic sacral wound stage II, presented with increasing shortness of breath.   Patient underwent bilateral AKA recently including right AKA in May and left AKA on 7/5.  And patient was recently diagnosed with septic shock secondary to bacteremia and liver abscess, for which patient has been on cefepime and Flagyl and the last dose will be August 4.  Recently, since patient has bilateral AKA, she has had extreme difficulty to get access to outpatient HD center.  She never missed her dialysis however according to outpatient HD records, it takes at least 4 staff members and [facility] lift for patient to be seated in the dialysis chair.  Starting today, patient started to feel shortness of breath, no chest pain no swelling.  No cough no fever or chills. She is now awaiting placement for HD that takes stretcher and is hoping to find one locally.  Assessment and Plan: Acute on chronic HFpEF with chronic hypoxemic respiratory failure -Secondary to uncontrolled hypertension, clinical has signs of mild fluid overload at presentation .Imdur was started at presentation Volume status improved with HD    HTN emergency -BP has improved but still mildly uncontrolled and fluctuating.  Continue isosorbide and metoprolol.   ESRD on HD -Admitting physician discussed with ED physician and case manager, likely patient will need long-term dialysis plan as currently, increasingly, outpatient HD center had hard time to accommodate patient's dialysis.  -Ongoing HD as per nephrology team.  Their input appreciated. -Discussed in detail with TOC during  morning rounds and they are exploring options, will be difficult to place. -Patient reports that she recently moved from [place] to be with her daughter in [place].  They insist on finding a place in [place].  Daughter does not have a car even to drive if she is placed far away.  Advised them that [facility] priority will be to find placement locally.  However also advised them that given multiple barriers, they may have to consider alternate/distant options.   Anemia of CKD -Iron studies ordered per nephrology, noted to be 53 -Nephrology starting IV iron weekly. -Hgb stable   Constipation -Bisacodyl suppository as MiraLAX does not work well for her -Patient has refused suppository as well as enema -Recently adjusted bowel regimen.   Nausea/vomiting -Resolved.   IIDM -Cont SSI as needed -Reasonably controlled   Body mass index is 62.73 kg/m./Morbid obesity -Recommend diet/lifestyle modification   Stage II sacral wound -Chronic, WOC consulted   Pressure Injury 09/11/21 Buttocks Right Stage 2 -  Partial thickness loss of dermis presenting as a shallow open injury with a red, pink wound bed without slough. pink (Active)  09/11/21 0000  Location: Buttocks  Location Orientation: Right  Staging: Stage 2 -  Partial thickness loss of dermis presenting as a shallow open injury with a red, pink wound bed without slough.  Wound Description (Comments): pink  Present on Admission: Yes     Pressure Injury 11/12/21 Hip Right Unstageable - Full thickness tissue loss in which the base of the injury is covered by slough (yellow, tan, gray, green or brown) and/or eschar (tan, brown or black) in the wound bed. 90% covered  with slough (Active)  11/12/21 0800  Location: Hip  Location Orientation: Right  Staging: Unstageable - Full thickness tissue loss in which the base of the injury is covered by slough (yellow, tan, gray, green or brown) and/or eschar (tan, brown or black) in the wound bed.  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 On recent admit, developed septic shock from bacteroides thetaiotaomicron bacteremia with septic emboli to spleen, initially on anticoagulation which was later held as splenic infarct was determined to be infectious and not blood clot -Pt receiving IV cefepime on HD and flagyl, completed 8/4 -Dr. Algis Liming consulted IR to evaluate abd drain in place.  IR was consulted to evaluate and see if she needs to continue with the drain or if this can be removed. Patient missed ID clinic appointment on 7/28.  Appreciate input by ID here. No indication for abx at this time, IR to remove drain per ID. ID has since signed off   PAD -Pt s/p B AKA for gangrene with plan to f/u with vascular surgery in 1 month (with Dr. Stanford Breed on 11/21/2021 for staple removal) -Pt was s/p L middle finger amputation for gangrene at recent admit    Hyperlipidemia: -Continue atorvastatin.   Chronic respiratory failure with hypoxia: -Patient reports that she uses 4 L/min home oxygen PTA.       Subjective: Seen on HD. Still complaining of some nausea   Physical Exam: Vitals:   11/13/21 1321 11/13/21 1405 11/13/21 1440 11/13/21 1510  BP: 127/82 (!) 143/63 (!) 139/59 (!) 125/34  Pulse: 73 70 74 76  Resp: 17 19 19 16   Temp:   98 F (36.7 C)   TempSrc:   Oral   SpO2:  98% 99% 100%  Weight:   (!) 159 kg   Height:       General exam: Awake, laying in bed, in nad Respiratory system: Normal respiratory effort, no wheezing Cardiovascular system: regular rate, s1, s2 Gastrointestinal system: Soft, nondistended, positive BS Central nervous system: CN2-12 grossly intact, strength intact Extremities: Perfused, no clubbing Skin: Normal skin turgor, no notable skin lesions seen Psychiatry: Mood normal //  no visual hallucinations   Data Reviewed:  Labs reviewed: Na 133, K 3.1, Hgb 8.6   Family Communication: Pt in room, family not at bedside  Disposition: Status is: Inpatient Remains inpatient appropriate because: Severity of illness  Planned Discharge Destination: Skilled nursing facility     Author: Marylu Lund, MD 11/13/2021 5:25 PM  For on call review [email or web address].

## 2021-11-13 NOTE — Progress Notes (Signed)
LUQ drain removed in tact at bedside today. No immediate complications. Patient tolerated well.  Dressing to remain x 24 hours then may remove and shower, keep insertion site clean, dry and dressed until healed. If dressing significantly before 24 hours may remove and place new dressing PRN.  Please call IR with any questions or concerns.  Candiss Norse, PA-C

## 2021-11-13 NOTE — Progress Notes (Signed)
[facility] Progress Note   Subjective:  Seen in HD unit. BP elevated at start of treatment. No chest pain/dyspnea. Endorses pain from sacral wounds.  Attempting 4L UF goal.   Objective Vitals:   11/13/21 0948 11/13/21 1000 11/13/21 1030 11/13/21 1100  BP: (!) 165/87 (!) 186/71 (!) 196/70 (!) 191/87  Pulse: 77 77 76 78  Resp: (!) 21 (!) 23 18 (!) 27  Temp:      TempSrc:      SpO2: 100% 100%    Weight:      Height:       Physical Exam General: Chronically ill appearing woman, obese. Nasal O2 in place. Heart: RRR; no murmur Lungs: CTAB; reduced air movement throughout Abdomen: soft Extremities: bilateral AKA; 1+ LUE edema Dialysis Access: [facility]  Additional Objective Labs: Basic Metabolic Panel: Recent Labs  Lab 11/08/21 0758 11/11/21 1437 11/13/21 1013  NA 135 134* 133*  K 3.5 3.3* 3.1*  CL 97* 97* 96*  CO2 29 27 27   GLUCOSE 105* 117* 116*  BUN 18 30* 27*  CREATININE 4.90* 6.79* 6.32*  CALCIUM 8.5* 8.3* 8.4*  PHOS 3.5 4.2 5.2*    Liver Function Tests: Recent Labs  Lab 11/08/21 0758 11/11/21 1437 11/13/21 1013  ALBUMIN 2.0* 2.1* 2.2*   \ CBC: Recent Labs  Lab 11/06/21 1805 11/08/21 0759 11/11/21 1437 11/13/21 1013  WBC 10.3 10.0 11.3* 11.6*  HGB 9.0* 8.4* 8.9* 8.6*  HCT 29.0* 29.0* 29.2* 28.4*  MCV 95.4 96.3 96.1 97.6  PLT 234 226 225 204    Medications:  anticoagulant sodium citrate     ferric gluconate (FERRLECIT) IVPB      atorvastatin  10 mg Oral Daily   calcium carbonate  200 mg of elemental calcium Oral TID WC   darbepoetin (ARANESP) injection - DIALYSIS  100 mcg Intravenous Q Fri-HD   docusate sodium  100 mg Oral BID   feeding supplement (NEPRO CARB STEADY)  237 mL Oral BID BM   gabapentin  100 mg Oral BID   Gerhardt's butt cream   Topical TID   heparin  5,000 Units Subcutaneous Q12H   insulin aspart  0-5 Units Subcutaneous QHS   insulin aspart  0-6 Units Subcutaneous TID WC   isosorbide mononitrate  30 mg Oral Daily    leptospermum manuka honey  1 Application Topical Daily   levothyroxine  100 mcg Oral Q0600   metoprolol tartrate  25 mg Oral BID   mouth rinse  15 mL Mouth Rinse 4 times per day   pantoprazole  40 mg Oral Daily   polyethylene glycol  17 g Oral BID   senna  1 tablet Oral Daily    Dialysis Orders: MWF - [place] ---?transferring to [facility] 4hrs, BFR 400, DFR 600,  AVF, EDW 146kg, 3K/ 2Ca - Heparin 2500 units with HD - Hectorol 106mcg IV qHD, held as of 10/30/21     Assessment/Plan: ESRD - on HD MWF. HD today Hypertension/volume - BP variable, UF as able with HD.  Metoprolol 25mg  BID started 11/04/21. Last CXR with B effusions/pulm edema. Weights seem way off - up 25kg from admit? 4L UFG as tolerated. Anemia of CKD - Hgb 8.6, transferrin too low to calculate tsat. Will start IV iron weekly for now since she is off IV abx. Aranesp q Friday.  Secondary Hyperparathyroidism - Ca/Phos to goal. Hectorol on hold, getting Tums 1000mg  TID as binder -> reduced dose to 500mg  TID.  Nutrition - Renal diet, have  added supplements. Alb very low. Dispo - Very difficulty situation. Patient required 4 staff members and 2 hoyer pads for transfer into HD chair as OP. Not staying on HD for full treatment d/t pain for multiple decubitus ulcers. This is a major safety concern for staff and patient in the outpatient center. Also concern for inability for ulcers to heal properly d/t frequent and rigorous transfers that are required. Need to determine new placement at a facility where she can receive HD in the bed-more likley a LTAC if possible.   Decubs-  abx and local wound care Recent septic shock 2/2 bacteremia/liver abscess - Now s/p course of IV cefepime as of 11/08/21.  [name] PA-C [facility] 11/13/2021,11:53 AM

## 2021-11-13 NOTE — Progress Notes (Addendum)
OT Cancellation Note  Patient Details Name: Mekhi Lascola MRN: [id number] DOB: 1970/07/29   Cancelled Treatment:    Reason Eval/Treat Not Completed: Patient at procedure or test/ unavailable (pt off unit at HD, will follow up as schedule permits)  5051: Pt still of unit at HD, will reattempt as schedule permits  [name], OTD, OTR/L Acute Rehab [phone]   Kaylyn Lim 11/13/2021, 10:41 AM

## 2021-11-13 NOTE — Progress Notes (Signed)
Pt refused to drink oral contrast for CT of the abdomen pelvis with contrast. Pt stated "she can't keep it down." Pt stated that drinking the oral contrast in the past, made her nauseous and made her vomit. MD notified and aware. MD said to do the CT with IV contrast only.

## 2021-11-14 DIAGNOSIS — L89152 Pressure ulcer of sacral region, stage 2: Secondary | ICD-10-CM | POA: Diagnosis not present

## 2021-11-14 LAB — GLUCOSE, CAPILLARY
Glucose-Capillary: 79 mg/dL (ref 70–99)
Glucose-Capillary: 188 mg/dL — ABNORMAL HIGH (ref 70–99)
Glucose-Capillary: 120 mg/dL — ABNORMAL HIGH (ref 70–99)
Glucose-Capillary: 121 mg/dL — ABNORMAL HIGH (ref 70–99)

## 2021-11-14 MED ORDER — POTASSIUM CHLORIDE 20 MEQ PO PACK
60.0000 meq | PACK | Freq: Once | ORAL | Status: AC
  Administered 2021-11-14: 60 meq via ORAL
  Filled 2021-11-14: qty 3

## 2021-11-14 MED ORDER — POTASSIUM CHLORIDE CRYS ER 20 MEQ PO TBCR
40.0000 meq | EXTENDED_RELEASE_TABLET | Freq: Once | ORAL | Status: AC
  Administered 2021-11-14: 40 meq via ORAL
  Filled 2021-11-14: qty 2

## 2021-11-14 NOTE — Progress Notes (Signed)
Physical Therapy Treatment Patient Details Name: Courtney Gray MRN: [id number] DOB: [birth year] Today's Date: 11/14/2021   History of Present Illness Pt adm 7/28 with acute pulmonary edema. 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    PT Comments    Patient tolerated lift up to bariatric wheelchair with anti-tippers. Patient eager to get out of her room and placed on portable oxygen and able to propel herself 40 ft and then 60 ft (resting between). Patient up in w/c with chair alarm and maxisky pad underneath her for return to bed by OT  and Mobility team.     Recommendations for follow up therapy are one component of a multi-disciplinary discharge planning process, led by the attending physician.  Recommendations may be updated based on patient status, additional functional criteria and insurance authorization.  Follow Up Recommendations  Skilled nursing-short term rehab (<3 hours/day) Can patient physically be transported by private vehicle: No   Assistance Recommended at Discharge Frequent or constant Supervision/Assistance  Patient can return home with the following Two people to help with walking and/or transfers;Two people to help with bathing/dressing/bathroom;Assist for transportation;Assistance with cooking/housework;Help with stairs or ramp for entrance   Equipment Recommendations  Wheelchair (measurements PT);Wheelchair cushion (measurements PT);Other (comment);Hospital bed (Needs wide w/c with cushion and anti-tippers with ?offset wheels to reduce risk of tipping backwards)    Recommendations for Other Services       Precautions / Restrictions Precautions Precautions: Fall Precaution Comments: bil AKA, 3L O2 at baseline, LUQ drain Restrictions Weight Bearing Restrictions: Yes RLE Weight Bearing: Non weight bearing LLE Weight Bearing: Non weight bearing Other Position/Activity  Restrictions: minimized pushing/pulling and weight through L hand, dressing now removed     Mobility  Bed Mobility Overal bed mobility: Needs Assistance Bed Mobility: Rolling Rolling: Max assist (with pad)         General bed mobility comments: rolling to place maxisky amputee sling for lift OOB    Transfers Overall transfer level: Needs assistance Equipment used:  (maxisky) Transfers: Bed to chair/wheelchair/BSC             General transfer comment: lifted up to bariatric w/c with bil anti-tippers; OT/Mobility team plan to lift her back to bed. Transfer via Immunologist (with amputee lift pad)  Ambulation/Gait               General Gait Details: unable   Theme park manager mobility: Yes Wheelchair propulsion: Both upper extremities Wheelchair parts: Needs assistance Distance: 40 (seated rest; 60 ft) Wheelchair Assistance Details (indicate cue type and reason): min assist for keeping w/c on straight path as pt provided propulsion; pt able to lock and unlock brakes with cues  Modified Rankin (Stroke Patients Only)       Balance Overall balance assessment: Needs assistance Sitting-balance support: No upper extremity supported Sitting balance-[name] Scale: Good Sitting balance - Comments: with long sitting in bed                                    Cognition Arousal/Alertness: Awake/alert Behavior During Therapy: WFL for tasks assessed/performed Overall Cognitive Status: Within Functional Limits for tasks assessed  Exercises      General Comments        Pertinent Vitals/Pain Pain Assessment Pain Assessment: Faces Faces Pain Scale: Hurts a little bit Pain Location: LLE and posterior rt thigh Pain Descriptors / Indicators: Guarding Pain Intervention(s): Limited activity within patient's tolerance    Home  Living                          Prior Function            PT Goals (current goals can now be found in the care plan section) Acute Rehab PT Goals Patient Stated Goal: Pt wants to get up into a w/c PT Goal Formulation: With patient Time For Goal Achievement: 11/17/21 Potential to Achieve Goals: Fair Progress towards PT goals: Progressing toward goals    Frequency    Min 2X/week      PT Plan Current plan remains appropriate    Co-evaluation              AM-PAC PT "6 Clicks" Mobility   Outcome Measure  Help needed turning from your back to your side while in a flat bed without using bedrails?: Total Help needed moving from lying on your back to sitting on the side of a flat bed without using bedrails?: Total Help needed moving to and from a bed to a chair (including a wheelchair)?: Total Help needed standing up from a chair using your arms (e.g., wheelchair or bedside chair)?: Total Help needed to walk in hospital room?: Total Help needed climbing 3-5 steps with a railing? : Total 6 Click Score: 6    End of Session Equipment Utilized During Treatment: Oxygen Activity Tolerance: Patient tolerated treatment well Patient left: with call bell/phone within reach;in chair;with chair alarm set Nurse Communication: Mobility status PT Visit Diagnosis: Other abnormalities of gait and mobility (R26.89);Muscle weakness (generalized) (M62.81) Pain - Right/Left: Left Pain - part of body:  (residual limb)     Time: 8616-8372 PT Time Calculation (min) (ACUTE ONLY): 1411 min  Charges:  $Therapeutic Activity: 8-22 mins $Wheel Chair Management: 8-22 mins                      [place]  Office [phone]    [name] 11/14/2021, 3:00 PM

## 2021-11-14 NOTE — Progress Notes (Signed)
[facility] Progress Note   Subjective:  Completed HD yesterday - net UF 4L. Tolerated well. No new complaints today.   Objective Vitals:   11/13/21 1748 11/13/21 2239 11/14/21 0508 11/14/21 0945  BP: (!) 122/41 128/79 (!) 133/53 (!) 132/51  Pulse: 81 81 79 74  Resp: 17 20 19 18   Temp: 98.3 F (36.8 C) 98.5 F (36.9 C) 98.6 F (37 C) 98.7 F (37.1 C)  TempSrc: Oral Oral Oral Oral  SpO2: 94% 98% 100% 99%  Weight:      Height:       Physical Exam General: Chronically ill appearing woman, obese. Nasal O2 in place. Heart: RRR; no murmur Lungs: CTAB; reduced air movement throughout Abdomen: soft Extremities: bilateral AKA; 1+ LUE edema Dialysis Access: [facility]  Additional Objective Labs: Basic Metabolic Panel: Recent Labs  Lab 11/08/21 0758 11/11/21 1437 11/13/21 1013  NA 135 134* 133*  K 3.5 3.3* 3.1*  CL 97* 97* 96*  CO2 29 27 27   GLUCOSE 105* 117* 116*  BUN 18 30* 27*  CREATININE 4.90* 6.79* 6.32*  CALCIUM 8.5* 8.3* 8.4*  PHOS 3.5 4.2 5.2*    Liver Function Tests: Recent Labs  Lab 11/08/21 0758 11/11/21 1437 11/13/21 1013  ALBUMIN 2.0* 2.1* 2.2*   \ CBC: Recent Labs  Lab 11/08/21 0759 11/11/21 1437 11/13/21 1013  WBC 10.0 11.3* 11.6*  HGB 8.4* 8.9* 8.6*  HCT 29.0* 29.2* 28.4*  MCV 96.3 96.1 97.6  PLT 226 225 204    Medications:  ferric gluconate (FERRLECIT) IVPB Stopped (11/13/21 1419)    atorvastatin  10 mg Oral Daily   calcium carbonate  200 mg of elemental calcium Oral TID WC   darbepoetin (ARANESP) injection - DIALYSIS  100 mcg Intravenous Q Fri-HD   docusate sodium  100 mg Oral BID   feeding supplement (NEPRO CARB STEADY)  237 mL Oral BID BM   gabapentin  100 mg Oral BID   Gerhardt's butt cream   Topical TID   heparin  5,000 Units Subcutaneous Q12H   insulin aspart  0-5 Units Subcutaneous QHS   insulin aspart  0-6 Units Subcutaneous TID WC   isosorbide mononitrate  30 mg Oral Daily   leptospermum manuka honey  1  Application Topical Daily   levothyroxine  100 mcg Oral Q0600   metoprolol tartrate  25 mg Oral BID   mouth rinse  15 mL Mouth Rinse 4 times per day   pantoprazole  40 mg Oral Daily   polyethylene glycol  17 g Oral BID   senna  1 tablet Oral Daily    Dialysis Orders: MWF - [place] ---?transferring to [facility] 4hrs, BFR 400, DFR 600,  AVF, EDW 146kg, 3K/ 2Ca - Heparin 2500 units with HD - Hectorol 41mcg IV qHD, held as of 10/30/21     Assessment/Plan: ESRD - on HD MWF. HD 8/11. High K bath  Hypertension/volume - BP variable. Acceptable this am. UF as able with HD.  Metoprolol 25mg  BID started 11/04/21. Last CXR with B effusions/pulm edema. Weights seem way off - up 25kg from admit? 4L UFG as tolerated. Anemia of CKD - Hgb 8.6, transferrin too low to calculate tsat. Will start IV iron weekly for now since she is off IV abx. Aranesp q Friday.  Secondary Hyperparathyroidism - Ca/Phos to goal. Hectorol on hold, getting Tums 1000mg  TID as binder -> reduced dose to 500mg  TID.  Nutrition - Renal diet, have added supplements. Alb very low. Dispo - Very  difficulty situation. Patient required 4 staff members and 2 hoyer pads for transfer into HD chair as OP. Not staying on HD for full treatment d/t pain for multiple decubitus ulcers. This is a major safety concern for staff and patient in the outpatient center. Also concern for inability for ulcers to heal properly d/t frequent and rigorous transfers that are required. Need to determine new placement at a facility where she can receive HD in the bed-more likley a LTAC if possible.   Decubs-  abx and local wound care Recent septic shock 2/2 bacteremia/liver abscess - Now s/p course of IV cefepime as of 11/08/21.  [name] PA-C [facility] 11/14/2021,10:12 AM

## 2021-11-14 NOTE — Progress Notes (Signed)
Occupational Therapy Treatment Patient Details Name: Courtney Gray MRN: [id number] DOB: Jun 28, 1970 Today's Date: 11/14/2021   History of present illness Pt adm 7/28 with acute pulmonary edema. 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   OT comments  Patient up in bariatric wheelchair with bilateral anti tippers. Patient was setup to perform grooming tasks while up in chair. Patient performed BUE shoulder strengthening with blue therapy band. Patient assisted back to bed with maxisky lift and assistance of 2 for safety. Patient was able to long sit in bed to remove lift pad. Acute OT to continue to follow.    Recommendations for follow up therapy are one component of a multi-disciplinary discharge planning process, led by the attending physician.  Recommendations may be updated based on patient status, additional functional criteria and insurance authorization.    Follow Up Recommendations  OT at Long-term acute care hospital    Assistance Recommended at Discharge Intermittent Supervision/Assistance  Patient can return home with the following  Two people to help with walking and/or transfers;Two people to help with bathing/dressing/bathroom;Assistance with cooking/housework;Direct supervision/assist for medications management;Assist for transportation;Help with stairs or ramp for entrance   Equipment Recommendations  Other (comment) (defer)    Recommendations for Other Services      Precautions / Restrictions Precautions Precautions: Fall Precaution Comments: bil AKA, 3L O2 at baseline, LUQ drain Restrictions Weight Bearing Restrictions: Yes RLE Weight Bearing: Non weight bearing LLE Weight Bearing: Non weight bearing Other Position/Activity Restrictions: minimized pushing/pulling and weight through L hand, dressing now removed       Mobility Bed Mobility Overal bed mobility: Needs  Assistance             General bed mobility comments: to sit up in bed to remove pad with min/mod assist +2    Transfers Overall transfer level: Needs assistance Equipment used:  (maxisky) Transfers: Bed to chair/wheelchair/BSC             General transfer comment: transferred from bariatric wheelchair with bil anti tippers Transfer via Lift Equipment: Maxisky   Balance Overall balance assessment: Needs assistance Sitting-balance support: No upper extremity supported Sitting balance-[name] Scale: Good Sitting balance - Comments: with long sitting in bed                                   ADL either performed or assessed with clinical judgement   ADL Overall ADL's : Needs assistance/impaired     Grooming: Wash/dry hands;Wash/dry face;Oral care;Brushing hair;Supervision/safety;Sitting Grooming Details (indicate cue type and reason): up in wheelchair                               General ADL Comments: grooming performed seated in wheelchair with setup and supervision    Extremity/Trunk Assessment              Vision       Perception     Praxis      Cognition Arousal/Alertness: Awake/alert Behavior During Therapy: WFL for tasks assessed/performed Overall Cognitive Status: Within Functional Limits for tasks assessed                                 General Comments: slow to respond to commands  Exercises Exercises: General Upper Extremity General Exercises - Upper Extremity Shoulder Flexion: Strengthening, Right, 10 reps, Seated, Theraband Theraband Level (Shoulder Flexion): Level 4 (Blue) Shoulder ABduction: Strengthening, Right, 10 reps, Theraband Theraband Level (Shoulder Abduction): Level 4 (Blue)    Shoulder Instructions       General Comments      Pertinent Vitals/ Pain       Pain Assessment Pain Assessment: Faces Faces Pain Scale: Hurts little more Pain Location: LLE and posterior rt  thigh Pain Descriptors / Indicators: Grimacing, Headache Pain Intervention(s): Limited activity within patient's tolerance, Monitored during session, Repositioned  Home Living                                          Prior Functioning/Environment              Frequency  Min 2X/week        Progress Toward Goals  OT Goals(current goals can now be found in the care plan section)  Progress towards OT goals: Progressing toward goals  Acute Rehab OT Goals Patient Stated Goal: get better OT Goal Formulation: With patient Time For Goal Achievement: 11/17/21 Potential to Achieve Goals: Fair ADL Goals Pt Will Perform Grooming: Independently;sitting;bed level Pt Will Perform Lower Body Bathing: with mod assist;with adaptive equipment;sitting/lateral leans Pt Will Perform Lower Body Dressing: with mod assist;with adaptive equipment;sitting/lateral leans Pt Will Transfer to Toilet: with mod assist;with transfer board;bedside commode Pt/caregiver will Perform Home Exercise Program: Increased strength;Both right and left upper extremity;With theraband;Independently Additional ADL Goal #1: Pt will perform bed mobility independently in prep for ADLs Additional ADL Goal #2: Pt will roll with min assist for pericare and pressure relief.  Plan Discharge plan needs to be updated    Co-evaluation                 AM-PAC OT "6 Clicks" Daily Activity     Outcome Measure   Help from another person eating meals?: A Little Help from another person taking care of personal grooming?: A Little Help from another person toileting, which includes using toliet, bedpan, or urinal?: Total Help from another person bathing (including washing, rinsing, drying)?: A Lot Help from another person to put on and taking off regular upper body clothing?: A Little Help from another person to put on and taking off regular lower body clothing?: A Lot 6 Click Score: 14    End of Session  Equipment Utilized During Treatment: Oxygen;Other (comment) (maxisky and bariatric wheelchair)  OT Visit Diagnosis: Muscle weakness (generalized) (M62.81);History of falling (Z91.81) Pain - Right/Left: Right Pain - part of body: Leg   Activity Tolerance Patient tolerated treatment well   Patient Left in bed;with call bell/phone within reach   Nurse Communication Mobility status        Time: 5003-7048 OT Time Calculation (min): 40 min  Charges: OT General Charges $OT Visit: 1 Visit OT Treatments $Self Care/Home Management : 8-22 mins $Therapeutic Activity: 8-22 mins $Therapeutic Exercise: 8-22 mins  Lodema Hong, [place]  Office [phone]   Trixie Dredge 11/14/2021, 3:30 PM

## 2021-11-14 NOTE — Progress Notes (Signed)
Progress Note   Patient: Courtney Gray QHU:[id number] DOB: 1970-09-18 DOA: 11/01/2021     13 DOS: the patient was seen and examined on 11/14/2021   Brief hospital course: 51 y.o. female with medical history significant of ESRD on HD TTS, recent bacteremia and hepatic abscess on long-term IV antibiotics, chronic HF PEF, PVD status post bilateral AKA, IIDM, morbid obesity, chronic sacral wound stage II, presented with increasing shortness of breath.   Patient underwent bilateral AKA recently including right AKA in May and left AKA on 7/5.  And patient was recently diagnosed with septic shock secondary to bacteremia and liver abscess, for which patient has been on cefepime and Flagyl and the last dose will be August 4.  Recently, since patient has bilateral AKA, she has had extreme difficulty to get access to outpatient HD center.  She never missed her dialysis however according to outpatient HD records, it takes at least 4 staff members and [facility] lift for patient to be seated in the dialysis chair.  Starting today, patient started to feel shortness of breath, no chest pain no swelling.  No cough no fever or chills. She is now awaiting placement for HD that takes stretcher and is hoping to find one locally.  Assessment and Plan: Acute on chronic HFpEF with chronic hypoxemic respiratory failure -Secondary to uncontrolled hypertension, clinical has signs of mild fluid overload at presentation .Imdur was started at presentation Volume status improved with HD    HTN emergency -Continue isosorbide and metoprolol. -BP currently stable    ESRD on HD -Admitting physician discussed with ED physician and case manager, likely patient will need long-term dialysis plan as currently, increasingly, outpatient HD center had hard time to accommodate patient's dialysis.  -Ongoing HD as per nephrology team.  Their input appreciated. -Discussed in detail with TOC during morning rounds and they are exploring options,  will be difficult to place. -Patient reports that she recently moved from [place] to be with her daughter in [place].  They insist on finding a place in [place].  Daughter does not have a car even to drive if she is placed far away.  Advised them that [facility] priority will be to find placement locally.  However also advised them that given multiple barriers, they may have to consider alternate/distant options. -Remains difficult placement   Anemia of CKD -Iron studies ordered per nephrology, noted to be 22 -Nephrology starting IV iron weekly. -Hgb stable   Constipation -Bisacodyl suppository as MiraLAX does not work well for her -Patient has refused suppository as well as enema -Recently adjusted bowel regimen.   Nausea/vomiting -Resolved.   IIDM -Cont SSI as needed -Reasonably controlled   Body mass index is 62.73 kg/m./Morbid obesity -Recommend diet/lifestyle modification   Stage II sacral wound -Chronic, WOC consulted   Pressure Injury 09/11/21 Buttocks Right Stage 2 -  Partial thickness loss of dermis presenting as a shallow open injury with a red, pink wound bed without slough. pink (Active)  09/11/21 0000  Location: Buttocks  Location Orientation: Right  Staging: Stage 2 -  Partial thickness loss of dermis presenting as a shallow open injury with a red, pink wound bed without slough.  Wound Description (Comments): pink  Present on Admission: Yes     Pressure Injury 11/12/21 Hip Right Unstageable - Full thickness tissue loss in which the base of the injury is covered by slough (yellow, tan, gray, green or brown) and/or eschar (tan, brown or black) in the wound bed. 90% covered with slough (Active)  11/12/21 0800  Location: Hip  Location Orientation: Right  Staging: Unstageable - Full thickness tissue loss in which the base of the injury is covered by slough (yellow, tan, gray, green or brown) and/or eschar (tan, brown or black) in the wound bed.  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 On recent admit, developed septic shock from bacteroides thetaiotaomicron bacteremia with septic emboli to spleen, initially on anticoagulation which was later held as splenic infarct was determined to be infectious and not blood clot -Pt receiving IV cefepime on HD and flagyl, completed 8/4 -Dr. Algis Liming consulted IR to evaluate abd drain in place.  IR was consulted and drain was removed 8/9 Patient missed ID clinic appointment on 7/28.  Appreciate input by ID here. No indication for abx at this time, IR to remove drain per ID. ID has since signed off -Pt reports abd discomfort has improved since drain was removed   PAD -Pt s/p B AKA for gangrene with plan to f/u with vascular surgery in 1 month (with Dr. Stanford Breed on 11/21/2021 for staple removal) -Pt was s/p L middle finger amputation for gangrene at recent admit    Hyperlipidemia: -Continue atorvastatin.   Chronic respiratory failure with hypoxia: -Patient reports that she uses 4 L/min home oxygen PTA.       Subjective: Feeling much better today since drain was placed  Physical Exam: Vitals:   11/13/21 1748 11/13/21 2239 11/14/21 0508 11/14/21 0945  BP: (!) 122/41 128/79 (!) 133/53 (!) 132/51  Pulse: 81 81 79 74  Resp: 17 20 19 18   Temp: 98.3 F (36.8 C) 98.5 F (36.9 C) 98.6 F (37 C) 98.7 F (37.1 C)  TempSrc: Oral Oral Oral Oral  SpO2: 94% 98% 100% 99%  Weight:      Height:       General exam: Conversant, in no acute distress Respiratory system: normal chest rise, clear, no audible wheezing Cardiovascular system: regular rhythm, s1-s2 Gastrointestinal system: Nondistended, nontender, pos BS Central nervous system: No seizures, no tremors Extremities: No cyanosis, no joint deformities Skin: No rashes, no  pallor Psychiatry: Affect normal // no auditory hallucinations   Data Reviewed:  There are no new results to review at this time.  Family Communication: Pt in room, family not at bedside  Disposition: Status is: Inpatient Remains inpatient appropriate because: Severity of illness  Planned Discharge Destination: Skilled nursing facility     Author: Marylu Lund, MD 11/14/2021 4:21 PM  For on call review [email or web address].

## 2021-11-15 DIAGNOSIS — L89152 Pressure ulcer of sacral region, stage 2: Secondary | ICD-10-CM | POA: Diagnosis not present

## 2021-11-15 LAB — RENAL FUNCTION PANEL
Albumin: 2.2 g/dL — ABNORMAL LOW (ref 3.5–5.0)
Anion gap: 9 (ref 5–15)
BUN: 25 mg/dL — ABNORMAL HIGH (ref 6–20)
CO2: 26 mmol/L (ref 22–32)
Calcium: 8.4 mg/dL — ABNORMAL LOW (ref 8.9–10.3)
Chloride: 96 mmol/L — ABNORMAL LOW (ref 98–111)
Creatinine, Ser: 5.75 mg/dL — ABNORMAL HIGH (ref 0.44–1.00)
GFR, Estimated: 8 mL/min — ABNORMAL LOW (ref >60)
Glucose, Bld: 132 mg/dL — ABNORMAL HIGH (ref 70–99)
Phosphorus: 4.9 mg/dL — ABNORMAL HIGH (ref 2.5–4.6)
Potassium: 3.3 mmol/L — ABNORMAL LOW (ref 3.5–5.1)
Sodium: 131 mmol/L — ABNORMAL LOW (ref 135–145)

## 2021-11-15 LAB — GLUCOSE, CAPILLARY
Glucose-Capillary: 141 mg/dL — ABNORMAL HIGH (ref 70–99)
Glucose-Capillary: 96 mg/dL (ref 70–99)
Glucose-Capillary: 158 mg/dL — ABNORMAL HIGH (ref 70–99)
Glucose-Capillary: 143 mg/dL — ABNORMAL HIGH (ref 70–99)

## 2021-11-15 LAB — CBC
HCT: 27.3 % — ABNORMAL LOW (ref 36.0–46.0)
Hemoglobin: 8.3 g/dL — ABNORMAL LOW (ref 12.0–15.0)
MCH: 29.3 pg (ref 26.0–34.0)
MCHC: 30.4 g/dL (ref 30.0–36.0)
MCV: 96.5 fL (ref 80.0–100.0)
Platelets: 175 10*3/uL (ref 150–400)
RBC: 2.83 MIL/uL — ABNORMAL LOW (ref 3.87–5.11)
RDW: 17.1 % — ABNORMAL HIGH (ref 11.5–15.5)
WBC: 10.8 10*3/uL — ABNORMAL HIGH (ref 4.0–10.5)
nRBC: 0 % (ref 0.0–0.2)

## 2021-11-15 MED ORDER — HEPARIN SODIUM (PORCINE) 1000 UNIT/ML DIALYSIS
20.0000 [IU]/kg | INTRAMUSCULAR | Status: DC | PRN
  Administered 2021-11-15: 3200 [IU] via INTRAVENOUS_CENTRAL
  Filled 2021-11-15: qty 4

## 2021-11-15 MED ORDER — ANTICOAGULANT SODIUM CITRATE 4% (200MG/5ML) IV SOLN: 5.0000 mL | Status: DC | PRN

## 2021-11-15 MED ORDER — ALTEPLASE 2 MG IJ SOLR: 2.0000 mg | Freq: Once | INTRAMUSCULAR | Status: DC | PRN

## 2021-11-15 MED ORDER — HEPARIN SODIUM (PORCINE) 1000 UNIT/ML DIALYSIS
1000.0000 [IU] | INTRAMUSCULAR | Status: DC | PRN
  Administered 2021-11-15: 1000 [IU]
  Filled 2021-11-15: qty 1

## 2021-11-15 NOTE — Progress Notes (Signed)
Patient taken to Dialysis at this time with transport.  No s/s of distress noted

## 2021-11-15 NOTE — Progress Notes (Signed)
[facility] Progress Note   Subjective:   Seen on HD. K+ 3.3 this AM. RN reports 4K bath is not available so using 3K. Pt denies any SOB, CP, dizziness, abdominal pain and nausea.   Objective Vitals:   11/15/21 0738 11/15/21 0744 11/15/21 0800 11/15/21 0831  BP: (!) 127/53 (!) 126/56 135/69 130/74  Pulse: 87 66 67 68  Resp: (!) 27 19 (!) 23 17  Temp: 98.8 F (37.1 C)     TempSrc: Oral     SpO2: 100% 100% 100% 100%  Weight: 125.6 kg     Height:       Physical Exam General: Tired appearing, obese female in NAD Heart: RRR, no murmurs, rubs or gallops Lungs: CTA bilaterally, diminished breath sounds due to body habitus Abdomen: Soft, +BS, non-distended Extremities: 1+ stump edema bilaterally Dialysis Access:  [facility] accessed  Additional Objective Labs: Basic Metabolic Panel: Recent Labs  Lab 11/11/21 1437 11/13/21 1013 11/15/21 0751  NA 134* 133* 131*  K 3.3* 3.1* 3.3*  CL 97* 96* 96*  CO2 27 27 26   GLUCOSE 117* 116* 132*  BUN 30* 27* 25*  CREATININE 6.79* 6.32* 5.75*  CALCIUM 8.3* 8.4* 8.4*  PHOS 4.2 5.2* 4.9*   Liver Function Tests: Recent Labs  Lab 11/11/21 1437 11/13/21 1013 11/15/21 0751  ALBUMIN 2.1* 2.2* 2.2*   No results for input(s): "LIPASE", "AMYLASE" in the last 168 hours. CBC: Recent Labs  Lab 11/11/21 1437 11/13/21 1013 11/15/21 0750  WBC 11.3* 11.6* 10.8*  HGB 8.9* 8.6* 8.3*  HCT 29.2* 28.4* 27.3*  MCV 96.1 97.6 96.5  PLT 225 204 175   Blood Culture    Component Value Date/Time   SDES BLOOD LEFT ANTECUBITAL 10/16/2021 1534   SPECREQUEST  10/16/2021 1534    BOTTLES DRAWN AEROBIC AND ANAEROBIC Blood Culture results may not be optimal due to an inadequate volume of blood received in culture bottles   CULT  10/16/2021 1534    NO GROWTH 5 DAYS Performed at [facility] [address] [place], [place] [address]    REPTSTATUS 10/21/2021 FINAL 10/16/2021 1534    Cardiac Enzymes: No results for input(s): "CKTOTAL",  "CKMB", "CKMBINDEX", "TROPONINI" in the last 168 hours. CBG: Recent Labs  Lab 11/14/21 0835 11/14/21 1154 11/14/21 1652 11/14/21 2051 11/15/21 0722  GLUCAP 79 188* 120* 121* 141*   Iron Studies: No results for input(s): "IRON", "TIBC", "TRANSFERRIN", "FERRITIN" in the last 72 hours. @lablastinr3 @ Studies/Results: CT ABDOMEN PELVIS W CONTRAST  Result Date: 11/13/2021 CLINICAL DATA:  Splenic abscess. EXAM: CT ABDOMEN AND PELVIS WITH CONTRAST TECHNIQUE: Multidetector CT imaging of the abdomen and pelvis was performed using the standard protocol following bolus administration of intravenous contrast. RADIATION DOSE REDUCTION: This exam was performed according to the departmental dose-optimization program which includes automated exposure control, adjustment of the mA and/or kV according to patient size and/or use of iterative reconstruction technique. CONTRAST:  122mL OMNIPAQUE IOHEXOL 300 MG/ML  SOLN COMPARISON:  October 07, 2021. FINDINGS: Lower chest: Moderate size loculated left pleural effusion is noted with associated subsegmental atelectasis. Hepatobiliary: No gallstones or biliary dilatation is noted. Drainage catheter that was noted adjacent the left hepatic lobe on prior exam has been removed. No definite hepatic abnormality is noted currently. Pancreas: Unremarkable. No pancreatic ductal dilatation or surrounding inflammatory changes. Spleen: There has been interval placement of percutaneous drainage catheter seen in splenic fluid collection or abscess noted on prior exam. Fluid collection appears to be completely decompressed at this  time. Adrenals/Urinary Tract: Adrenal glands appear normal. Severe bilateral renal atrophy is noted consistent with end-stage renal disease. No hydronephrosis or renal obstruction is noted. Urinary bladder is decompressed. Stomach/Bowel: Stomach is within normal limits. Appendix appears normal. No evidence of bowel wall thickening, distention, or inflammatory changes.  Vascular/Lymphatic: Aortic atherosclerosis. No enlarged abdominal or pelvic lymph nodes. Reproductive: Uterus and bilateral adnexa are unremarkable. Other: No abdominal wall hernia or abnormality. No abdominopelvic ascites. Musculoskeletal: No acute or significant osseous findings. IMPRESSION: Moderate size loculated left pleural effusion with associated subsegmental atelectasis. Interval placement of percutaneous drainage catheter into splenic fluid collection or abscess. This fluid collection appears to be completely decompressed at this time. Left hepatic drainage catheter noted on prior exam has been removed. Severe bilateral renal atrophy is noted consistent with end-stage renal disease. Aortic Atherosclerosis (ICD10-I70.0). Electronically Signed   By: Marijo Conception M.D.   On: 11/13/2021 08:52   Medications:  anticoagulant sodium citrate     ferric gluconate (FERRLECIT) IVPB Stopped (11/13/21 1419)    atorvastatin  10 mg Oral Daily   calcium carbonate  200 mg of elemental calcium Oral TID WC   darbepoetin (ARANESP) injection - DIALYSIS  100 mcg Intravenous Q Fri-HD   docusate sodium  100 mg Oral BID   feeding supplement (NEPRO CARB STEADY)  237 mL Oral BID BM   gabapentin  100 mg Oral BID   Gerhardt's butt cream   Topical TID   heparin  5,000 Units Subcutaneous Q12H   insulin aspart  0-5 Units Subcutaneous QHS   insulin aspart  0-6 Units Subcutaneous TID WC   isosorbide mononitrate  30 mg Oral Daily   leptospermum manuka honey  1 Application Topical Daily   levothyroxine  100 mcg Oral Q0600   metoprolol tartrate  25 mg Oral BID   mouth rinse  15 mL Mouth Rinse 4 times per day   pantoprazole  40 mg Oral Daily   polyethylene glycol  17 g Oral BID   senna  1 tablet Oral Daily    Dialysis Orders: MWF - [facility] ---?transferring to [facility] 4hrs, BFR 400, DFR 600,  AVF, EDW 146kg, 3K/ 2Ca - Heparin 2500 units with HD - Hectorol 54mcg IV qHD, held as of  10/30/21  Assessment/Plan: ESRD - on HD MWF. HD 8/11. High K bath  Hypertension/volume - BP variable. Acceptable this am. UF as able with HD.  Metoprolol 25mg  BID started 11/04/21. Last CXR with B effusions/pulm edema. Wide variation in recorded bed weights. 4L UFG today as tolerated. Hypokalemia: K+ 3.3, received two doses of PO Kcl yesterday, 100 mEq total. Using 3K bath with HD but 4K is not available. Recheck K+ tomorrow and supplement PRN Anemia of CKD - Hgb 8.6, transferrin too low to calculate tsat. Will start IV iron weekly for now since she is off IV abx. Aranesp q Friday.  Secondary Hyperparathyroidism - Ca/Phos to goal. Hectorol on hold, getting Tums 1000mg  TID as binder -> reduced dose to 500mg  TID and calcium/phos remain at goal Nutrition - Renal diet, have added supplements. Alb very low. Dispo - Very difficulty situation. Patient required 4 staff members and 2 hoyer pads for transfer into HD chair as OP. Not staying on HD for full treatment d/t pain for multiple decubitus ulcers. This is a major safety concern for staff and patient in the outpatient center. Also concern for inability for ulcers to heal properly d/t frequent and rigorous transfers that are required. Need to determine  new placement at a facility where she can receive HD in the bed-more likley a LTAC if possible.   Decubs-  abx and local wound care Recent septic shock 2/2 bacteremia/liver abscess - Now s/p course of IV cefepime as of 11/08/21.  Anice Paganini, PA-C 11/15/2021, 8:33 AM  [facility] Pager: [phone]

## 2021-11-15 NOTE — Progress Notes (Signed)
Progress Note   Patient: Courtney Gray DUK:[id number] DOB: [birth year] DOA: 11/01/2021     14 DOS: the patient was seen and examined on 11/15/2021   Brief hospital course: 51 y.o. female with medical history significant of ESRD on HD TTS, recent bacteremia and hepatic abscess on long-term IV antibiotics, chronic HF PEF, PVD status post bilateral AKA, IIDM, morbid obesity, chronic sacral wound stage II, presented with increasing shortness of breath.   Patient underwent bilateral AKA recently including right AKA in May and left AKA on 7/5.  And patient was recently diagnosed with septic shock secondary to bacteremia and liver abscess, for which patient has been on cefepime and Flagyl and the last dose will be August 4.  Recently, since patient has bilateral AKA, she has had extreme difficulty to get access to outpatient HD center.  She never missed her dialysis however according to outpatient HD records, it takes at least 4 staff members and [facility] lift for patient to be seated in the dialysis chair.  Starting today, patient started to feel shortness of breath, no chest pain no swelling.  No cough no fever or chills. She is now awaiting placement for HD that takes stretcher and is hoping to find one locally.  Assessment and Plan: Acute on chronic HFpEF with chronic hypoxemic respiratory failure -Secondary to uncontrolled hypertension, clinical has signs of mild fluid overload at presentation .Imdur was started at presentation Volume status improved with HD    HTN emergency -Continue isosorbide and metoprolol. -BP currently stable    ESRD on HD -Admitting physician discussed with ED physician and case manager, likely patient will need long-term dialysis plan as currently, increasingly, outpatient HD center had hard time to accommodate patient's dialysis.  -Ongoing HD as per nephrology team.  Their input appreciated. -Discussed in detail with TOC during morning rounds and they are exploring options,  will be difficult to place. -Patient reports that she recently moved from [place] to be with her daughter in [place].  They insist on finding a place in [place].  Daughter does not have a car even to drive if she is placed far away.  Advised them that [facility] priority will be to find placement locally.  However also advised them that given multiple barriers, they may have to consider alternate/distant options. -Seen on HD today. TOC working on potential placement   Anemia of CKD -Iron studies ordered per nephrology, noted to be 39 -Nephrology starting IV iron weekly. -Hgb stable   Constipation -Bisacodyl suppository as MiraLAX does not work well for her -Patient has refused suppository as well as enema -Recently adjusted bowel regimen.   Nausea/vomiting -Resolved.   IIDM -Cont SSI as needed -Reasonably controlled   Body mass index is 62.73 kg/m./Morbid obesity -Recommend diet/lifestyle modification   Stage II sacral wound -Chronic, WOC consulted   Pressure Injury 09/11/21 Buttocks Right Stage 2 -  Partial thickness loss of dermis presenting as a shallow open injury with a red, pink wound bed without slough. pink (Active)  09/11/21 0000  Location: Buttocks  Location Orientation: Right  Staging: Stage 2 -  Partial thickness loss of dermis presenting as a shallow open injury with a red, pink wound bed without slough.  Wound Description (Comments): pink  Present on Admission: Yes     Pressure Injury 11/12/21 Hip Right Unstageable - Full thickness tissue loss in which the base of the injury is covered by slough (yellow, tan, gray, green or brown) and/or eschar (tan, brown or black) in the wound  bed. 90% covered with slough (Active)  11/12/21 0800  Location: Hip  Location Orientation: Right  Staging: Unstageable - Full thickness tissue loss in which the base of the injury is covered by slough (yellow, tan, gray, green or brown) and/or eschar (tan, brown or black) in the wound bed.   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 On recent admit, developed septic shock from bacteroides thetaiotaomicron bacteremia with septic emboli to spleen, initially on anticoagulation which was later held as splenic infarct was determined to be infectious and not blood clot -Pt receiving IV cefepime on HD and flagyl, completed 8/4 -Dr. Algis Liming consulted IR to evaluate abd drain in place.  IR was consulted and drain was removed 8/9 Patient missed ID clinic appointment on 7/28.  Appreciate input by ID here. No indication for abx at this time, IR to remove drain per ID. ID has since signed off -Pt reports abd discomfort has improved since drain was removed   PAD -Pt s/p B AKA for gangrene with plan to f/u with vascular surgery in 1 month (with Dr. Stanford Breed on 11/21/2021 for staple removal) -Pt was s/p L middle finger amputation for gangrene at recent admit    Hyperlipidemia: -Continue atorvastatin.   Chronic respiratory failure with hypoxia: -Patient reports that she uses 4 L/min home oxygen PTA.       Subjective: Feeling much better today since drain was placed  Physical Exam: Vitals:   11/15/21 1130 11/15/21 1148 11/15/21 1153 11/15/21 1242  BP: (!) 113/48 (!) 104/53 (!) 117/56 108/67  Pulse: 73 75 75 77  Resp: (!) 22 (!) 24 (!) 28 20  Temp:  98.1 F (36.7 C)    TempSrc:  Oral    SpO2: 100% 100% 100% 99%  Weight:  123.8 kg    Height:       General exam: Conversant, in no acute distress Respiratory system: normal chest rise, clear, no audible wheezing Cardiovascular system: regular rhythm, s1-s2 Gastrointestinal system: Nondistended, nontender, pos BS Central nervous system: No seizures, no tremors Extremities: No cyanosis, no joint deformities Skin: No rashes, no pallor Psychiatry:  Affect normal // no auditory hallucinations   Data Reviewed:  There are no new results to review at this time.  Family Communication: Pt in room, family not at bedside  Disposition: Status is: Inpatient Remains inpatient appropriate because: Severity of illness  Planned Discharge Destination: Skilled nursing facility     Author: Marylu Lund, MD 11/15/2021 3:47 PM  For on call review [email or web address].

## 2021-11-15 NOTE — Progress Notes (Signed)
Received patient in bed to unit.  Alert and oriented.  Informed consent signed and in  chart.   Treatment initiated: 7673  Treatment completed: 1148  Patient tolerated well.  Transported back to the room  alert, without acute distress.  Hand-off given to patient's nurse.   Access used: HD cath Access issues: none  Total UF removed: 1550 Medication(s) given: Heparin  3200 units bolus at beginning of tx, Aranesp 100 mcg IVP, Heparin dwells 4200 unit Post HD VS: 98.1    117/56    75 28 100% 4L [place] Post HD weight: 123.8kg    [facility]

## 2021-11-15 NOTE — Progress Notes (Signed)
Received a call from Mariel Craft with [facility] this am. Pt was denied by [facility] for stretcher HD. Kim to submit pt's information to a clinic in [place] to see if they will accept pt as a stretcher HD pt. Update provided to CSW and [facility]M staff at progression. Will update nephrologist as well.   Melven Sartorius Renal Navigator [phone]

## 2021-11-16 DIAGNOSIS — L89152 Pressure ulcer of sacral region, stage 2: Secondary | ICD-10-CM | POA: Diagnosis not present

## 2021-11-16 DIAGNOSIS — N186 End stage renal disease: Secondary | ICD-10-CM | POA: Diagnosis not present

## 2021-11-16 LAB — GLUCOSE, CAPILLARY
Glucose-Capillary: 116 mg/dL — ABNORMAL HIGH (ref 70–99)
Glucose-Capillary: 217 mg/dL — ABNORMAL HIGH (ref 70–99)
Glucose-Capillary: 187 mg/dL — ABNORMAL HIGH (ref 70–99)
Glucose-Capillary: 185 mg/dL — ABNORMAL HIGH (ref 70–99)

## 2021-11-16 MED ORDER — BOOST / RESOURCE BREEZE PO LIQD CUSTOM
1.0000 | Freq: Three times a day (TID) | ORAL | Status: DC
  Administered 2021-11-16 – 2022-05-15 (×277): 1 via ORAL

## 2021-11-16 NOTE — Progress Notes (Signed)
Progress Note   Patient: Courtney Gray KWI:[id number] DOB: 04-Jun-1970 DOA: 11/01/2021     15 DOS: the patient was seen and examined on 11/16/2021   Brief hospital course: 51 y.o. female with medical history significant of ESRD on HD TTS, recent bacteremia and hepatic abscess on long-term IV antibiotics, chronic HF PEF, PVD status post bilateral AKA, IIDM, morbid obesity, chronic sacral wound stage II, presented with increasing shortness of breath.   Patient underwent bilateral AKA recently including right AKA in May and left AKA on 7/5.  And patient was recently diagnosed with septic shock secondary to bacteremia and liver abscess, for which patient has been on cefepime and Flagyl and the last dose will be August 4.  Recently, since patient has bilateral AKA, she has had extreme difficulty to get access to outpatient HD center.  She never missed her dialysis however according to outpatient HD records, it takes at least 4 staff members and [facility] lift for patient to be seated in the dialysis chair.  Starting today, patient started to feel shortness of breath, no chest pain no swelling.  No cough no fever or chills. She is now awaiting placement for HD that takes stretcher and is hoping to find one locally.  Assessment and Plan: Acute on chronic HFpEF with chronic hypoxemic respiratory failure -Secondary to uncontrolled hypertension, clinical has signs of mild fluid overload at presentation .-Imdur was started at presentation, as well as low dose metoprolol -BP soft during HD, thus imdur held -Volume status improved with HD    HTN emergency -Had been continued on isosorbide and metoprolol. -BP soft during HD, thus imdur stopped -Continued on low dose metoprolol   ESRD on HD -Admitting physician discussed with ED physician and case manager, likely patient will need long-term dialysis plan as currently, increasingly, outpatient HD center had hard time to accommodate patient's dialysis.  -Ongoing  HD as per nephrology team.  Their input appreciated. -Discussed in detail with TOC during morning rounds and they are exploring options, will be difficult to place. -Patient reports that she recently moved from [place] to be with her daughter in [place].  They insist on finding a place in [place].  Daughter does not have a car even to drive if she is placed far away.  Advised them that [facility] priority will be to find placement locally.  However also advised them that given multiple barriers, they may have to consider alternate/distant options. -TOC working on potential placement   Anemia of CKD -Iron studies ordered per nephrology, noted to be 90 -Nephrology starting IV iron weekly. -Hgb stable   Constipation -Bisacodyl suppository as MiraLAX does not work well for her -Patient has refused suppository as well as enema -Recently adjusted bowel regimen.   Nausea/vomiting -Resolved.   IIDM -Cont SSI as needed -Reasonably controlled   Body mass index is 62.73 kg/m./Morbid obesity -Recommend diet/lifestyle modification   Stage II sacral wound -Chronic, WOC consulted   Pressure Injury 09/11/21 Buttocks Right Stage 2 -  Partial thickness loss of dermis presenting as a shallow open injury with a red, pink wound bed without slough. pink (Active)  09/11/21 0000  Location: Buttocks  Location Orientation: Right  Staging: Stage 2 -  Partial thickness loss of dermis presenting as a shallow open injury with a red, pink wound bed without slough.  Wound Description (Comments): pink  Present on Admission: Yes     Pressure Injury 11/12/21 Hip Right Unstageable - Full thickness tissue loss in which the base of the  injury is covered by slough (yellow, tan, gray, green or brown) and/or eschar (tan, brown or black) in the wound bed. 90% covered with slough (Active)  11/12/21 0800  Location: Hip  Location Orientation: Right  Staging: Unstageable - Full thickness tissue loss in which the base of  the injury is covered by slough (yellow, tan, gray, green or brown) and/or eschar (tan, brown or black) in the wound bed.  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 On recent admit, developed septic shock from bacteroides thetaiotaomicron bacteremia with septic emboli to spleen, initially on anticoagulation which was later held as splenic infarct was determined to be infectious and not blood clot -Pt receiving IV cefepime on HD and flagyl, completed 8/4 -Dr. Algis Liming consulted IR to evaluate abd drain in place.  IR was consulted and drain was removed 8/9 Patient missed ID clinic appointment on 7/28.  Appreciate input by ID here. No indication for abx at this time, IR to remove drain per ID. ID has since signed off -Pt reports abd discomfort has improved since drain was removed   PAD -Pt s/p B AKA for gangrene with plan to f/u with vascular surgery in 1 month (with Dr. Stanford Breed on 11/21/2021 for staple removal) -Pt was s/p L middle finger amputation for gangrene at recent admit    Hyperlipidemia: -Continue atorvastatin.   Chronic respiratory failure with hypoxia: -Patient reports that she uses 4 L/min home oxygen PTA.       Subjective: Without complaints this AM  Physical Exam: Vitals:   11/15/21 1643 11/15/21 2104 11/16/21 0507 11/16/21 1015  BP: (!) 125/39 (!) 152/66 (!) 152/63 (!) 148/53  Pulse: 78 84 76 72  Resp: 18 18 18 17   Temp: 98 F (36.7 C) 98.6 F (37 C) 98.6 F (37 C) 98.2 F (36.8 C)  TempSrc:  Oral Oral   SpO2: 100% 100% 100% 100%  Weight:      Height:       General exam: Awake, laying in bed, in nad Respiratory system: Normal respiratory effort, no wheezing Cardiovascular system: regular rate, s1, s2 Gastrointestinal system: Soft, nondistended, positive BS Central nervous  system: CN2-12 grossly intact, strength intact Extremities: Perfused, no clubbing Skin: Normal skin turgor, no notable skin lesions seen Psychiatry: Mood normal // no visual hallucinations   Data Reviewed:  There are no new results to review at this time.  Family Communication: Pt in room, family not at bedside  Disposition: Status is: Inpatient Remains inpatient appropriate because: Severity of illness  Planned Discharge Destination: Skilled nursing facility     Author: Marylu Lund, MD 11/16/2021 3:18 PM  For on call review [email or web address].

## 2021-11-16 NOTE — Progress Notes (Signed)
Staples removed from patient left AKA incision site. Applied ABD pad and tape to site.

## 2021-11-16 NOTE — Progress Notes (Signed)
[facility] Progress Note   Subjective:   Complains of constipation this AM. Reports no BM in 2 days and stomach is uncomfortable but not painful. No nausea/vomiting. Denies SOB, CP, dizziness.   Objective Vitals:   11/15/21 1242 11/15/21 1643 11/15/21 2104 11/16/21 0507  BP: 108/67 (!) 125/39 (!) 152/66 (!) 152/63  Pulse: 77 78 84 76  Resp: 20 18 18 18   Temp:  98 F (36.7 C) 98.6 F (37 C) 98.6 F (37 C)  TempSrc:   Oral Oral  SpO2: 99% 100% 100% 100%  Weight:      Height:       Physical Exam General: Alert, obese female in NAD Heart: RRR, no murmurs, rubs or gallops Lungs: CTA bilaterally without wheezing, rhonchi or rales Abdomen: Soft, non-distended, +BS Extremities: trace stump edema bilaterally Dialysis Access: [facility]  Additional Objective Labs: Basic Metabolic Panel: Recent Labs  Lab 11/11/21 1437 11/13/21 1013 11/15/21 0751  NA 134* 133* 131*  K 3.3* 3.1* 3.3*  CL 97* 96* 96*  CO2 27 27 26   GLUCOSE 117* 116* 132*  BUN 30* 27* 25*  CREATININE 6.79* 6.32* 5.75*  CALCIUM 8.3* 8.4* 8.4*  PHOS 4.2 5.2* 4.9*   Liver Function Tests: Recent Labs  Lab 11/11/21 1437 11/13/21 1013 11/15/21 0751  ALBUMIN 2.1* 2.2* 2.2*   No results for input(s): "LIPASE", "AMYLASE" in the last 168 hours. CBC: Recent Labs  Lab 11/11/21 1437 11/13/21 1013 11/15/21 0750  WBC 11.3* 11.6* 10.8*  HGB 8.9* 8.6* 8.3*  HCT 29.2* 28.4* 27.3*  MCV 96.1 97.6 96.5  PLT 225 204 175   Blood Culture    Component Value Date/Time   SDES BLOOD LEFT ANTECUBITAL 10/16/2021 1534   SPECREQUEST  10/16/2021 1534    BOTTLES DRAWN AEROBIC AND ANAEROBIC Blood Culture results may not be optimal due to an inadequate volume of blood received in culture bottles   CULT  10/16/2021 1534    NO GROWTH 5 DAYS Performed at [facility] [address] [place], [place] [address]    REPTSTATUS 10/21/2021 FINAL 10/16/2021 1534    Cardiac Enzymes: No results for input(s):  "CKTOTAL", "CKMB", "CKMBINDEX", "TROPONINI" in the last 168 hours. CBG: Recent Labs  Lab 11/15/21 0722 11/15/21 1224 11/15/21 1641 11/15/21 2102 11/16/21 0728  GLUCAP 141* 96 158* 143* 116*   Iron Studies: No results for input(s): "IRON", "TIBC", "TRANSFERRIN", "FERRITIN" in the last 72 hours. @lablastinr3 @ Studies/Results: No results found. Medications:  ferric gluconate (FERRLECIT) IVPB Stopped (11/13/21 1419)    atorvastatin  10 mg Oral Daily   calcium carbonate  200 mg of elemental calcium Oral TID WC   darbepoetin (ARANESP) injection - DIALYSIS  100 mcg Intravenous Q Fri-HD   docusate sodium  100 mg Oral BID   feeding supplement (NEPRO CARB STEADY)  237 mL Oral BID BM   gabapentin  100 mg Oral BID   Gerhardt's butt cream   Topical TID   heparin  5,000 Units Subcutaneous Q12H   insulin aspart  0-5 Units Subcutaneous QHS   insulin aspart  0-6 Units Subcutaneous TID WC   isosorbide mononitrate  30 mg Oral Daily   leptospermum manuka honey  1 Application Topical Daily   levothyroxine  100 mcg Oral Q0600   metoprolol tartrate  25 mg Oral BID   mouth rinse  15 mL Mouth Rinse 4 times per day   pantoprazole  40 mg Oral Daily   polyethylene glycol  17 g Oral BID  senna  1 tablet Oral Daily    Dialysis Orders: MWF - [place] ---?transferring to [facility] 4hrs, BFR 400, DFR 600,  AVF, EDW 146kg, 3K/ 2Ca - Heparin 2500 units with HD - Hectorol 25mcg IV qHD, held as of 10/30/21  Assessment/Plan: ESRD - on HD MWF. HD 8/11. High K bath  2. Hypertension/volume - BP variable. Acceptable this am. UF as able with HD.  Metoprolol 25mg  BID started 11/04/21. Last CXR with B effusions/pulm edema. Wide variation in recorded bed weights. Requested her UF goal to be reduced yesterday, continue 3-4L goals as tolerated. 3. Hypokalemia: K+ 3.3 yesterday, requiring intermittent supplementation.  Using 3K bath with HD but 4K is not available. Recheck K+ and supplement PRN 4. Anemia of CKD  - Hgb 8.3, transferrin too low to calculate tsat. Will start IV iron weekly for now since she is off IV abx. Aranesp q Friday.  5. Secondary Hyperparathyroidism - Ca/Phos to goal. Hectorol on hold, getting Tums 1000mg  TID as binder -> reduced dose to 500mg  TID and calcium/phos remain at goal 6. Nutrition - Renal diet, have added supplements. Alb very low. 7. Constipation- appears she has been taking colace but refusing senna and miralax, encourage to utilize both and could consider lactulose if not effective 8. Dispo - Very difficulty situation. Patient required 4 staff members and 2 hoyer pads for transfer into HD chair as OP. Not staying on HD for full treatment d/t pain for multiple decubitus ulcers. This is a major safety concern for staff and patient in the outpatient center. Also concern for inability for ulcers to heal properly d/t frequent and rigorous transfers that are required. Need to determine new placement at a facility where she can receive HD in the bed-more likley a LTAC if possible.   9. Recent septic shock 2/2 bacteremia/liver abscess - Now s/p course of IV cefepime as of 11/08/21.    Anice Paganini, PA-C 11/16/2021, 9:36 AM  [facility] Pager: [phone]

## 2021-11-17 DIAGNOSIS — N186 End stage renal disease: Secondary | ICD-10-CM | POA: Diagnosis not present

## 2021-11-17 DIAGNOSIS — L89152 Pressure ulcer of sacral region, stage 2: Secondary | ICD-10-CM | POA: Diagnosis not present

## 2021-11-17 LAB — GLUCOSE, CAPILLARY
Glucose-Capillary: 139 mg/dL — ABNORMAL HIGH (ref 70–99)
Glucose-Capillary: 162 mg/dL — ABNORMAL HIGH (ref 70–99)
Glucose-Capillary: 143 mg/dL — ABNORMAL HIGH (ref 70–99)
Glucose-Capillary: 149 mg/dL — ABNORMAL HIGH (ref 70–99)

## 2021-11-17 NOTE — Progress Notes (Signed)
Mobility Specialist Criteria Algorithm Info.   11/17/21 1400  Mobility  Activity Dangled on edge of bed;Moved into chair position in bed (UE exercises)  Range of Motion/Exercises Active;Right arm;Left arm  Level of Assistance Moderate assist, patient does 50-74%  Assistive Device None  Activity Response Tolerated well   Patient received in bed eager and motivated to participate in mobility. Discovered skylift was not working so opted to dangle EOB and perform UE exercises. Required mod/max A plus use of bed pads to assist to and from EOB. Patient has enough core strength to statically sit and perform exercises. Tolerated without complaint or incident. Was left in supine with all needs met, call bell in reach.   [place] Lonzy Mato, [place], [place]  VUFCZ:[phone] Office: [phone]

## 2021-11-17 NOTE — Progress Notes (Signed)
Progress Note   Patient: Courtney Gray NIO:[id number] DOB: [birth year] DOA: 11/01/2021     16 DOS: the patient was seen and examined on 11/17/2021   Brief hospital course: 51 y.o. female with medical history significant of ESRD on HD TTS, recent bacteremia and hepatic abscess on long-term IV antibiotics, chronic HF PEF, PVD status post bilateral AKA, IIDM, morbid obesity, chronic sacral wound stage II, presented with increasing shortness of breath.   Patient underwent bilateral AKA recently including right AKA in May and left AKA on 7/5.  And patient was recently diagnosed with septic shock secondary to bacteremia and liver abscess, for which patient has been on cefepime and Flagyl and the last dose will be August 4.  Recently, since patient has bilateral AKA, she has had extreme difficulty to get access to outpatient HD center.  She never missed her dialysis however according to outpatient HD records, it takes at least 4 staff members and [facility] lift for patient to be seated in the dialysis chair.  Starting today, patient started to feel shortness of breath, no chest pain no swelling.  No cough no fever or chills. She is now awaiting placement for HD that takes stretcher and is hoping to find one locally.  Assessment and Plan: Acute on chronic HFpEF with chronic hypoxemic respiratory failure -Secondary to uncontrolled hypertension, clinical has signs of mild fluid overload at presentation .-Imdur was started at presentation, as well as low dose metoprolol -BP soft during HD, thus imdur held -Continue to manage volume status via HD    HTN emergency -Had been continued on isosorbide and metoprolol. -BP noted to be soft during HD, thus imdur stopped -Continued on low dose metoprolol -BP stable    ESRD on HD -Admitting physician discussed with ED physician and case manager, likely patient will need long-term dialysis plan as currently, increasingly, outpatient HD center had hard time to  accommodate patient's dialysis.  -Ongoing HD as per nephrology team.  Their input appreciated. -Discussed in detail with TOC during morning rounds and they are exploring options, will be difficult to place. -Patient reports that she recently moved from [place] to be with her daughter in [place].  They insist on finding a place in [place].  Daughter does not have a car even to drive if she is placed far away.  Advised them that [facility] priority will be to find placement locally.  However also advised them that given multiple barriers, they may have to consider alternate/distant options. -TOC working on potential placement   Anemia of CKD -Iron studies ordered per nephrology, noted to be 9 -Nephrology starting IV iron weekly. -Hgb stable   Constipation -Bisacodyl suppository as MiraLAX does not work well for her -Patient has refused suppository as well as enema -Recently adjusted bowel regimen.   Nausea/vomiting -Resolved.   IIDM -Cont SSI as needed -Reasonably controlled   Body mass index is 62.73 kg/m./Morbid obesity -Recommend diet/lifestyle modification   Stage II sacral wound -Chronic, WOC consulted   Pressure Injury 09/11/21 Buttocks Right Stage 2 -  Partial thickness loss of dermis presenting as a shallow open injury with a red, pink wound bed without slough. pink (Active)  09/11/21 0000  Location: Buttocks  Location Orientation: Right  Staging: Stage 2 -  Partial thickness loss of dermis presenting as a shallow open injury with a red, pink wound bed without slough.  Wound Description (Comments): pink  Present on Admission: Yes     Pressure Injury 11/12/21 Hip Right Unstageable - Full thickness  tissue loss in which the base of the injury is covered by slough (yellow, tan, gray, green or brown) and/or eschar (tan, brown or black) in the wound bed. 90% covered with slough (Active)  11/12/21 0800  Location: Hip  Location Orientation: Right  Staging: Unstageable - Full  thickness tissue loss in which the base of the injury is covered by slough (yellow, tan, gray, green or brown) and/or eschar (tan, brown or black) in the wound bed.  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 On recent admit, developed septic shock from bacteroides thetaiotaomicron bacteremia with septic emboli to spleen, initially on anticoagulation which was later held as splenic infarct was determined to be infectious and not blood clot -Pt receiving IV cefepime on HD and flagyl, completed 8/4 -Dr. Algis Liming consulted IR to evaluate abd drain in place.  IR was consulted and drain was removed 8/9 Patient missed ID clinic appointment on 7/28.  Appreciate input by ID here. No indication for abx at this time, IR to remove drain per ID. ID has since signed off -Pt reports abd discomfort has improved since drain was removed   PAD -Pt s/p B AKA for gangrene with plan to f/u with vascular surgery in 1 month (with Dr. Stanford Breed on 11/21/2021 for staple removal) -Pt was s/p L middle finger amputation for gangrene at recent admit    Hyperlipidemia: -Continue atorvastatin.   Chronic respiratory failure with hypoxia: -Patient reports that she uses 4 L/min home oxygen PTA.       Subjective: No complaints this AM  Physical Exam: Vitals:   11/16/21 1728 11/16/21 2150 11/17/21 0342 11/17/21 1029  BP: (!) 150/80 (!) 86/55 (!) 154/79 (!) 141/68  Pulse: 79 76 73 76  Resp: 19 18  17   Temp: 98.3 F (36.8 C) 98.5 F (36.9 C) 98 F (36.7 C) 98 F (36.7 C)  TempSrc:  Oral Oral   SpO2: 93% 98% 97% 98%  Weight:      Height:       General exam: Conversant, in no acute distress Respiratory system: normal chest rise, clear, no audible wheezing Cardiovascular system: regular rhythm, s1-s2 Gastrointestinal system:  Nondistended, nontender, pos BS Central nervous system: No seizures, no tremors Extremities: No cyanosis, no joint deformities Skin: No rashes, no pallor Psychiatry: Affect normal // no auditory hallucinations   Data Reviewed:  There are no new results to review at this time.  Family Communication: Pt in room, family at bedside  Disposition: Status is: Inpatient Remains inpatient appropriate because: Severity of illness  Planned Discharge Destination: Skilled nursing facility     Author: Marylu Lund, MD 11/17/2021 3:43 PM  For on call review [email or web address].

## 2021-11-17 NOTE — Progress Notes (Signed)
[facility] Progress Note   Subjective:   Seen in room, feeling well today. Was able to have a BM yesterday. Denies SOB, CP, dizziness and nausea.   Objective Vitals:   11/16/21 1015 11/16/21 1728 11/16/21 2150 11/17/21 0342  BP: (!) 148/53 (!) 150/80 (!) 86/55 (!) 154/79  Pulse: 72 79 76 73  Resp: 17 19 18    Temp: 98.2 F (36.8 C) 98.3 F (36.8 C) 98.5 F (36.9 C) 98 F (36.7 C)  TempSrc:   Oral Oral  SpO2: 100% 93% 98% 97%  Weight:      Height:       Physical Exam General: Alert, obese female in NAD Heart: RRR, no murmurs, rubs or gallops Lungs: CTA bilaterally without wheezing, rhonchi or rales Abdomen: Soft, non-distended, +BS Extremities: trace stump edema bilaterally Dialysis Access: [facility] with intact dressing  Additional Objective Labs: Basic Metabolic Panel: Recent Labs  Lab 11/11/21 1437 11/13/21 1013 11/15/21 0751  NA 134* 133* 131*  K 3.3* 3.1* 3.3*  CL 97* 96* 96*  CO2 27 27 26   GLUCOSE 117* 116* 132*  BUN 30* 27* 25*  CREATININE 6.79* 6.32* 5.75*  CALCIUM 8.3* 8.4* 8.4*  PHOS 4.2 5.2* 4.9*   Liver Function Tests: Recent Labs  Lab 11/11/21 1437 11/13/21 1013 11/15/21 0751  ALBUMIN 2.1* 2.2* 2.2*   No results for input(s): "LIPASE", "AMYLASE" in the last 168 hours. CBC: Recent Labs  Lab 11/11/21 1437 11/13/21 1013 11/15/21 0750  WBC 11.3* 11.6* 10.8*  HGB 8.9* 8.6* 8.3*  HCT 29.2* 28.4* 27.3*  MCV 96.1 97.6 96.5  PLT 225 204 175   Blood Culture    Component Value Date/Time   SDES BLOOD LEFT ANTECUBITAL 10/16/2021 1534   SPECREQUEST  10/16/2021 1534    BOTTLES DRAWN AEROBIC AND ANAEROBIC Blood Culture results may not be optimal due to an inadequate volume of blood received in culture bottles   CULT  10/16/2021 1534    NO GROWTH 5 DAYS Performed at [facility] [address] [place], [place] [address]    REPTSTATUS 10/21/2021 FINAL 10/16/2021 1534    Cardiac Enzymes: No results for input(s): "CKTOTAL",  "CKMB", "CKMBINDEX", "TROPONINI" in the last 168 hours. CBG: Recent Labs  Lab 11/16/21 0728 11/16/21 1138 11/16/21 1647 11/16/21 2151 11/17/21 0729  GLUCAP 116* 217* 187* 185* 139*   Iron Studies: No results for input(s): "IRON", "TIBC", "TRANSFERRIN", "FERRITIN" in the last 72 hours. @lablastinr3 @ Studies/Results: No results found. Medications:  ferric gluconate (FERRLECIT) IVPB Stopped (11/13/21 1419)    atorvastatin  10 mg Oral Daily   calcium carbonate  200 mg of elemental calcium Oral TID WC   darbepoetin (ARANESP) injection - DIALYSIS  100 mcg Intravenous Q Fri-HD   docusate sodium  100 mg Oral BID   feeding supplement  1 Container Oral TID BM   gabapentin  100 mg Oral BID   Gerhardt's butt cream   Topical TID   heparin  5,000 Units Subcutaneous Q12H   insulin aspart  0-5 Units Subcutaneous QHS   insulin aspart  0-6 Units Subcutaneous TID WC   leptospermum manuka honey  1 Application Topical Daily   levothyroxine  100 mcg Oral Q0600   metoprolol tartrate  25 mg Oral BID   mouth rinse  15 mL Mouth Rinse 4 times per day   pantoprazole  40 mg Oral Daily   polyethylene glycol  17 g Oral BID   senna  1 tablet Oral Daily  Dialysis Orders: MWF - [facility] ---?transferring to [facility] 4hrs, BFR 400, DFR 600,  AVF, EDW 146kg, 3K/ 2Ca - Heparin 2500 units with HD - Hectorol 4mcg IV qHD, held as of 10/30/21  Assessment/Plan: ESRD - on HD MWF. HD 8/11. High K bath  2. Hypertension/volume - BP variable.  UF as able with HD.  Metoprolol 25mg  BID started 11/04/21. Last CXR with B effusions/pulm edema. Wide variation in recorded bed weights. Continue 3-4L goals as tolerated. 3. Hypokalemia: Requiring intermittent supplementation.  Using 3K bath with HD but 4K is not available. supplement PRN 4. Anemia of CKD - Hgb 8.3, transferrin too low to calculate tsat. Will start IV iron weekly for now since she is off IV abx. Aranesp q Friday.  5. Secondary Hyperparathyroidism -  Ca/Phos to goal. Hectorol on hold, getting Tums 1000mg  TID as binder -> reduced dose to 500mg  TID and calcium/phos remain at goal 6. Nutrition - Renal diet, have added supplements. Alb very low. 7. Dispo - Very difficulty situation. Patient required 4 staff members and 2 hoyer pads for transfer into HD chair as OP. Not staying on HD for full treatment d/t pain for multiple decubitus ulcers. This is a major safety concern for staff and patient in the outpatient center. Also concern for inability for ulcers to heal properly d/t frequent and rigorous transfers that are required. Need to determine new placement at a facility where she can receive HD in the bed-more likley a LTAC if possible.   8. Recent septic shock 2/2 bacteremia/liver abscess - Now s/p course of IV cefepime as of 11/08/21.  Anice Paganini, PA-C 11/17/2021, 8:51 AM  [facility] Pager: [phone]

## 2021-11-18 LAB — RENAL FUNCTION PANEL
Albumin: 2.3 g/dL — ABNORMAL LOW (ref 3.5–5.0)
Anion gap: 10 (ref 5–15)
BUN: 42 mg/dL — ABNORMAL HIGH (ref 6–20)
CO2: 26 mmol/L (ref 22–32)
Calcium: 8.7 mg/dL — ABNORMAL LOW (ref 8.9–10.3)
Chloride: 99 mmol/L (ref 98–111)
Creatinine, Ser: 7.96 mg/dL — ABNORMAL HIGH (ref 0.44–1.00)
GFR, Estimated: 6 mL/min — ABNORMAL LOW (ref >60)
Glucose, Bld: 128 mg/dL — ABNORMAL HIGH (ref 70–99)
Phosphorus: 5.8 mg/dL — ABNORMAL HIGH (ref 2.5–4.6)
Potassium: 4 mmol/L (ref 3.5–5.1)
Sodium: 135 mmol/L (ref 135–145)

## 2021-11-18 LAB — GLUCOSE, CAPILLARY
Glucose-Capillary: 87 mg/dL (ref 70–99)
Glucose-Capillary: 104 mg/dL — ABNORMAL HIGH (ref 70–99)
Glucose-Capillary: 92 mg/dL (ref 70–99)
Glucose-Capillary: 233 mg/dL — ABNORMAL HIGH (ref 70–99)

## 2021-11-18 LAB — CBC
HCT: 27.7 % — ABNORMAL LOW (ref 36.0–46.0)
Hemoglobin: 8.4 g/dL — ABNORMAL LOW (ref 12.0–15.0)
MCH: 29.2 pg (ref 26.0–34.0)
MCHC: 30.3 g/dL (ref 30.0–36.0)
MCV: 96.2 fL (ref 80.0–100.0)
Platelets: 208 10*3/uL (ref 150–400)
RBC: 2.88 MIL/uL — ABNORMAL LOW (ref 3.87–5.11)
RDW: 17.1 % — ABNORMAL HIGH (ref 11.5–15.5)
WBC: 13.2 10*3/uL — ABNORMAL HIGH (ref 4.0–10.5)
nRBC: 0 % (ref 0.0–0.2)

## 2021-11-18 MED ORDER — HEPARIN SODIUM (PORCINE) 1000 UNIT/ML DIALYSIS
1000.0000 [IU] | INTRAMUSCULAR | Status: DC | PRN
  Filled 2021-11-18: qty 1

## 2021-11-18 MED ORDER — ALTEPLASE 2 MG IJ SOLR: 2.0000 mg | Freq: Once | INTRAMUSCULAR | Status: DC | PRN

## 2021-11-18 MED ORDER — ANTICOAGULANT SODIUM CITRATE 4% (200MG/5ML) IV SOLN: 5.0000 mL | Status: DC | PRN

## 2021-11-18 NOTE — Progress Notes (Signed)
Physical Therapy Treatment Patient Details Name: Courtney Gray MRN: [id number] DOB: [birth year] Today's Date: 11/18/2021   History of Present Illness Pt adm 7/28 with acute pulmonary edema. 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    PT Comments    Pt eager to get out of bed, but apprehensive because she is supposed to go to dialysis today and had not left yet. Pt agreeable to work with new mobility techs to orient to amputee lifting. Pt requires maxAx2 for rolling for lift pad placement. Pt experiencing increased R thigh pressure injury pain so hips shifted towards L when pad placed. Apparent when trying to place pt in wheelchair. Despite increased effort, unable to safely seat pt's hips in wheelchair. Had to lift back to bed to replace lift pad to insure hips squared in lift pad. While lifting pt, transport came to take to HD. PT will follow back for OOB tomorrow. D/c plan remains appropriate at this time.    Recommendations for follow up therapy are one component of a multi-disciplinary discharge planning process, led by the attending physician.  Recommendations may be updated based on patient status, additional functional criteria and insurance authorization.  Follow Up Recommendations  Skilled nursing-short term rehab (<3 hours/day) Can patient physically be transported by private vehicle: No   Assistance Recommended at Discharge Frequent or constant Supervision/Assistance  Patient can return home with the following Two people to help with walking and/or transfers;Two people to help with bathing/dressing/bathroom;Assist for transportation;Assistance with cooking/housework;Help with stairs or ramp for entrance   Equipment Recommendations  Wheelchair (measurements PT);Wheelchair cushion (measurements PT);Other (comment);Hospital bed (Needs wide w/c with cushion and anti-tippers with ?offset wheels to  reduce risk of tipping backwards)       Precautions / Restrictions Precautions Precautions: Fall Precaution Comments: bil AKA, 3L O2 at baseline, LUQ drain Restrictions Weight Bearing Restrictions: Yes RLE Weight Bearing: Non weight bearing LLE Weight Bearing: Non weight bearing Other Position/Activity Restrictions: minimized pushing/pulling and weight through L hand, dressing now removed     Mobility  Bed Mobility Overal bed mobility: Needs Assistance Bed Mobility: Rolling Rolling: Max assist (with pad)         General bed mobility comments: rolling to place maxisky amputee sling for lift OOB    Transfers Overall transfer level: Needs assistance Equipment used:  (maxisky) Transfers: Bed to chair/wheelchair/BSC             General transfer comment: lifted up to bariatric w/c with bil anti-tippers; Transfer via Immunologist (with amputee lift pad)  Ambulation/Gait               General Gait Details: unable       Information systems manager propulsion: Both upper extremities Wheelchair Assistance Details (indicate cue type and reason): min assist for keeping w/c on straight path as pt provided propulsion; pt able to lock and unlock brakes with cues  Modified Rankin (Stroke Patients Only)       Balance Overall balance assessment: Needs assistance Sitting-balance support: No upper extremity supported Sitting balance-[name] Scale: Good Sitting balance - Comments: with long sitting in bed                                    Cognition Arousal/Alertness: Awake/alert Behavior During Therapy: WFL for tasks assessed/performed Overall Cognitive Status:  Within Functional Limits for tasks assessed                                             General Comments General comments (skin integrity, edema, etc.): VSS on O2      Pertinent Vitals/Pain Pain Assessment Pain Assessment: Faces Faces  Pain Scale: Hurts a little bit Pain Location: LLE and posterior rt thigh wounds Pain Descriptors / Indicators: Guarding Pain Intervention(s): Limited activity within patient's tolerance, Monitored during session, Repositioned     PT Goals (current goals can now be found in the care plan section) Acute Rehab PT Goals Patient Stated Goal: Pt wants to get up into a w/c PT Goal Formulation: With patient Time For Goal Achievement: 11/17/21 Potential to Achieve Goals: Fair Progress towards PT goals: Progressing toward goals    Frequency    Min 2X/week      PT Plan Current plan remains appropriate       AM-PAC PT "6 Clicks" Mobility   Outcome Measure  Help needed turning from your back to your side while in a flat bed without using bedrails?: Total Help needed moving from lying on your back to sitting on the side of a flat bed without using bedrails?: Total Help needed moving to and from a bed to a chair (including a wheelchair)?: Total Help needed standing up from a chair using your arms (e.g., wheelchair or bedside chair)?: Total Help needed to walk in hospital room?: Total Help needed climbing 3-5 steps with a railing? : Total 6 Click Score: 6    End of Session Equipment Utilized During Treatment: Oxygen Activity Tolerance: Patient tolerated treatment well Patient left: with call bell/phone within reach;in chair;with chair alarm set Nurse Communication: Mobility status PT Visit Diagnosis: Other abnormalities of gait and mobility (R26.89);Muscle weakness (generalized) (M62.81) Pain - Right/Left: Left Pain - part of body:  (residual limb)     Time: 7829-5621 PT Time Calculation (min) (ACUTE ONLY): 42 min  Charges:  $Therapeutic Activity: 38-52 mins                     Dabney Dever B. Migdalia Dk PT, DPT Acute Rehabilitation Services Please use secure chat or  Call Office [phone]    [place] 11/18/2021, 1:35 PM

## 2021-11-18 NOTE — Progress Notes (Signed)
[facility] Progress Note   Subjective:   Seen in room. Endorses some pain from sacral wounds. Denies cp, dyspnea. Dialysis today  Objective Vitals:   11/17/21 1029 11/17/21 1721 11/17/21 2104 11/18/21 0634  BP: (!) 141/68 (!) 143/65 (!) 156/65 (!) 141/44  Pulse: 76 74 83 68  Resp: 17 18 18 16   Temp: 98 F (36.7 C) 98.3 F (36.8 C) 98.7 F (37.1 C) 97.8 F (36.6 C)  TempSrc:    Oral  SpO2: 98% 97% 98% 100%  Weight:    124.7 kg  Height:       Physical Exam General: Alert, obese female in NAD Heart: RRR, no murmurs, rubs or gallops Lungs: CTA bilaterally without wheezing, rhonchi or rales Abdomen: Soft, non-distended, +BS Extremities: trace stump edema bilaterally Dialysis Access: [facility] with intact dressing  Additional Objective Labs: Basic Metabolic Panel: Recent Labs  Lab 11/11/21 1437 11/13/21 1013 11/15/21 0751  NA 134* 133* 131*  K 3.3* 3.1* 3.3*  CL 97* 96* 96*  CO2 27 27 26   GLUCOSE 117* 116* 132*  BUN 30* 27* 25*  CREATININE 6.79* 6.32* 5.75*  CALCIUM 8.3* 8.4* 8.4*  PHOS 4.2 5.2* 4.9*    Liver Function Tests: Recent Labs  Lab 11/11/21 1437 11/13/21 1013 11/15/21 0751  ALBUMIN 2.1* 2.2* 2.2*    No results for input(s): "LIPASE", "AMYLASE" in the last 168 hours. CBC: Recent Labs  Lab 11/11/21 1437 11/13/21 1013 11/15/21 0750  WBC 11.3* 11.6* 10.8*  HGB 8.9* 8.6* 8.3*  HCT 29.2* 28.4* 27.3*  MCV 96.1 97.6 96.5  PLT 225 204 175    Blood Culture    Component Value Date/Time   SDES BLOOD LEFT ANTECUBITAL 10/16/2021 1534   SPECREQUEST  10/16/2021 1534    BOTTLES DRAWN AEROBIC AND ANAEROBIC Blood Culture results may not be optimal due to an inadequate volume of blood received in culture bottles   CULT  10/16/2021 1534    NO GROWTH 5 DAYS Performed at [facility] [address] [place], [place] [address]    REPTSTATUS 10/21/2021 FINAL 10/16/2021 1534    Cardiac Enzymes: No results for input(s): "CKTOTAL",  "CKMB", "CKMBINDEX", "TROPONINI" in the last 168 hours. CBG: Recent Labs  Lab 11/17/21 0729 11/17/21 1136 11/17/21 1619 11/17/21 2105 11/18/21 0726  GLUCAP 139* 162* 143* 149* 87    Iron Studies: No results for input(s): "IRON", "TIBC", "TRANSFERRIN", "FERRITIN" in the last 72 hours. @lablastinr3 @ Studies/Results: No results found. Medications:  ferric gluconate (FERRLECIT) IVPB Stopped (11/13/21 1419)    atorvastatin  10 mg Oral Daily   calcium carbonate  200 mg of elemental calcium Oral TID WC   darbepoetin (ARANESP) injection - DIALYSIS  100 mcg Intravenous Q Fri-HD   docusate sodium  100 mg Oral BID   feeding supplement  1 Container Oral TID BM   gabapentin  100 mg Oral BID   heparin  5,000 Units Subcutaneous Q12H   insulin aspart  0-5 Units Subcutaneous QHS   insulin aspart  0-6 Units Subcutaneous TID WC   leptospermum manuka honey  1 Application Topical Daily   levothyroxine  100 mcg Oral Q0600   metoprolol tartrate  25 mg Oral BID   mouth rinse  15 mL Mouth Rinse 4 times per day   pantoprazole  40 mg Oral Daily   polyethylene glycol  17 g Oral BID   senna  1 tablet Oral Daily    Dialysis Orders: MWF - [facility] ---?transferring to [facility]  4hrs, BFR 400, DFR 600,  AVF, EDW 146kg, 3K/ 2Ca - Heparin 2500 units with HD - Hectorol 34mcg IV qHD, held as of 10/30/21  Assessment/Plan: ESRD - on HD MWF. HD today.  2. Hypertension/volume - BP variable.  UF as able with HD.  Metoprolol 25mg  BID started 11/04/21. Last CXR with B effusions/pulm edema. Wide variation in recorded bed weights. Continue 3-4L goals as tolerated. 3. Hypokalemia: Requiring intermittent supplementation.  Using 3K bath with HD but 4K is not available. supplement PRN 4. Anemia of CKD - Hgb 8.3, transferrin too low to calculate tsat. Will start IV iron weekly for now since she is off IV abx. Aranesp q Friday.  5. Secondary Hyperparathyroidism - Ca/Phos to goal. Hectorol on hold, getting Tums  1000mg  TID as binder -> reduced dose to 500mg  TID and calcium/phos remain at goal 6. Nutrition - Renal diet, have added supplements. Alb very low. 7. Dispo - Very difficulty situation. Patient required 4 staff members and 2 hoyer pads for transfer into HD chair as OP. Not staying on HD for full treatment d/t pain for multiple decubitus ulcers. This is a major safety concern for staff and patient in the outpatient center. Also concern for inability for ulcers to heal properly d/t frequent and rigorous transfers that are required. Need to determine new placement at a facility where she can receive HD in the bed-more likley a LTAC if possible.   8. Recent septic shock 2/2 bacteremia/liver abscess - Now s/p course of IV cefepime as of 11/08/21.  Courtney Child PA-C [facility] 11/18/2021,9:22 AM

## 2021-11-19 DIAGNOSIS — N186 End stage renal disease: Secondary | ICD-10-CM | POA: Diagnosis not present

## 2021-11-19 LAB — GLUCOSE, CAPILLARY
Glucose-Capillary: 95 mg/dL (ref 70–99)
Glucose-Capillary: 131 mg/dL — ABNORMAL HIGH (ref 70–99)
Glucose-Capillary: 154 mg/dL — ABNORMAL HIGH (ref 70–99)
Glucose-Capillary: 140 mg/dL — ABNORMAL HIGH (ref 70–99)

## 2021-11-19 NOTE — Progress Notes (Signed)
[facility] Progress Note   Subjective:   Seen in room. Completed dialysis yesterday with 3.5L off. No issues with dialysis. No complaints this am. Did well with PT yesterday.     Objective Vitals:   11/18/21 1800 11/18/21 2059 11/19/21 0511 11/19/21 0834  BP:  132/71 119/62 (!) 134/48  Pulse: 78 82 73 71  Resp: 18 19 18 17   Temp:  98.2 F (36.8 C) 97.8 F (36.6 C) 98.4 F (36.9 C)  TempSrc:  Oral Oral Oral  SpO2: 100%  100% 99%  Weight:      Height:       Physical Exam General: Alert, obese female in NAD Heart: RRR, no murmurs, rubs or gallops Lungs: CTA bilaterally without wheezing, rhonchi or rales Abdomen: Soft, non-distended, +BS Extremities: trace stump edema bilaterally Dialysis Access: [facility] with intact dressing  Additional Objective Labs: Basic Metabolic Panel: Recent Labs  Lab 11/13/21 1013 11/15/21 0751 11/18/21 1430  NA 133* 131* 135  K 3.1* 3.3* 4.0  CL 96* 96* 99  CO2 27 26 26   GLUCOSE 116* 132* 128*  BUN 27* 25* 42*  CREATININE 6.32* 5.75* 7.96*  CALCIUM 8.4* 8.4* 8.7*  PHOS 5.2* 4.9* 5.8*    Liver Function Tests: Recent Labs  Lab 11/13/21 1013 11/15/21 0751 11/18/21 1430  ALBUMIN 2.2* 2.2* 2.3*    No results for input(s): "LIPASE", "AMYLASE" in the last 168 hours. CBC: Recent Labs  Lab 11/13/21 1013 11/15/21 0750 11/18/21 1412  WBC 11.6* 10.8* 13.2*  HGB 8.6* 8.3* 8.4*  HCT 28.4* 27.3* 27.7*  MCV 97.6 96.5 96.2  PLT 204 175 208    Blood Culture    Component Value Date/Time   SDES BLOOD LEFT ANTECUBITAL 10/16/2021 1534   SPECREQUEST  10/16/2021 1534    BOTTLES DRAWN AEROBIC AND ANAEROBIC Blood Culture results may not be optimal due to an inadequate volume of blood received in culture bottles   CULT  10/16/2021 1534    NO GROWTH 5 DAYS Performed at [facility] [address], [place], [place] [address]    REPTSTATUS 10/21/2021 FINAL 10/16/2021 1534    Cardiac Enzymes: No results for input(s):  "CKTOTAL", "CKMB", "CKMBINDEX", "TROPONINI" in the last 168 hours. CBG: Recent Labs  Lab 11/18/21 0726 11/18/21 1216 11/18/21 1903 11/18/21 2103 11/19/21 0725  GLUCAP 87 104* 92 233* 95    Iron Studies: No results for input(s): "IRON", "TIBC", "TRANSFERRIN", "FERRITIN" in the last 72 hours. @lablastinr3 @ Studies/Results: No results found. Medications:  ferric gluconate (FERRLECIT) IVPB 125 mg (11/18/21 1422)    atorvastatin  10 mg Oral Daily   darbepoetin (ARANESP) injection - DIALYSIS  100 mcg Intravenous Q Fri-HD   docusate sodium  100 mg Oral BID   feeding supplement  1 Container Oral TID BM   gabapentin  100 mg Oral BID   heparin  5,000 Units Subcutaneous Q12H   insulin aspart  0-5 Units Subcutaneous QHS   insulin aspart  0-6 Units Subcutaneous TID WC   leptospermum manuka honey  1 Application Topical Daily   levothyroxine  100 mcg Oral Q0600   metoprolol tartrate  25 mg Oral BID   mouth rinse  15 mL Mouth Rinse 4 times per day   pantoprazole  40 mg Oral Daily   polyethylene glycol  17 g Oral BID   senna  1 tablet Oral Daily    Dialysis Orders: MWF - [facility] ---?transferring to [facility] 4hrs, BFR 400, DFR 600,  AVF, EDW  146kg, 3K/ 2Ca - Heparin 2500 units with HD - Hectorol 4mcg IV qHD, held as of 10/30/21  Assessment/Plan: ESRD - on HD MWF. HD 8/16.  2. Hypertension/volume - BP variable.  UF as able with HD.  Metoprolol 25mg  BID started 11/04/21.  Wide variation in recorded bed weights. Continue 3-4L goals as tolerated. 3. Hypokalemia: Requiring intermittent supplementation.  Using 3K bath with HD but 4K is not available. supplement PRN 4. Anemia of CKD - Hgb 8s. transferrin too low to calculate tsat. Will start IV iron weekly for now since she is off IV abx. Aranesp q Friday.  5. Secondary Hyperparathyroidism - Hectorol on hold, getting Tums 1000mg  TID as binder -> reduced dose to 500mg  TID. Ca/Phos ok.  6. Nutrition - Renal diet, have added supplements.  Alb very low. 7. Dispo - Very difficulty situation. Patient required 4 staff members and 2 hoyer pads for transfer into HD chair as OP. Not staying on HD for full treatment d/t pain for multiple decubitus ulcers. This is a major safety concern for staff and patient in the outpatient center. Also concern for inability for ulcers to heal properly d/t frequent and rigorous transfers that are required. Need to determine new placement at a facility where she can receive HD in the bed-more likley a LTAC if possible.   8. Recent septic shock 2/2 bacteremia/liver abscess - Now s/p course of IV cefepime as of 11/08/21.  [name] PA-C [facility] 11/19/2021,9:40 AM

## 2021-11-19 NOTE — Progress Notes (Signed)
Physical Therapy Treatment Patient Details Name: Courtney Gray MRN: [id number] DOB: 02/03/1971 Today's Date: 11/19/2021   History of Present Illness Pt adm 7/28 with acute pulmonary edema. 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    PT Comments    Patient eager to get OOB to wheelchair and work on w/c propulsion. See below for details. End of session pt wanted to sit up for an additional 30 minutes. Up with chest strap in place and call bell in reach. Plan to return with Mobility Tech for transfer back to bed.     Recommendations for follow up therapy are one component of a multi-disciplinary discharge planning process, led by the attending physician.  Recommendations may be updated based on patient status, additional functional criteria and insurance authorization.  Follow Up Recommendations  Skilled nursing-short term rehab (<3 hours/day) Can patient physically be transported by private vehicle: No   Assistance Recommended at Discharge Frequent or constant Supervision/Assistance  Patient can return home with the following Two people to help with walking and/or transfers;Two people to help with bathing/dressing/bathroom;Assist for transportation;Assistance with cooking/housework;Help with stairs or ramp for entrance   Equipment Recommendations  Wheelchair (measurements PT);Wheelchair cushion (measurements PT);Other (comment);Hospital bed (Needs wide w/c with cushion and anti-tippers with ?offset wheels to reduce risk of tipping backwards)    Recommendations for Other Services       Precautions / Restrictions Precautions Precautions: Fall Precaution Comments: bil AKA, 3L O2 at baseline, LUQ drain Restrictions Weight Bearing Restrictions: Yes RLE Weight Bearing: Non weight bearing LLE Weight Bearing: Non weight bearing Other Position/Activity Restrictions: minimized pushing/pulling and weight  through L hand, dressing now removed     Mobility  Bed Mobility Overal bed mobility: Needs Assistance Bed Mobility: Rolling Rolling: Max assist (with pad)         General bed mobility comments: rolling to each side to place maxisky amputee sling for lift OOB    Transfers Overall transfer level: Needs assistance Equipment used:  (maxisky) Transfers: Bed to chair/wheelchair/BSC             General transfer comment: lifted up to bariatric w/c with bil anti-tippers; using green loops at shoulders and purple loops at legs Transfer via Lift Equipment: [place] (with amputee lift pad)  Ambulation/Gait               General Gait Details: unable   Theme park manager mobility: Yes Wheelchair propulsion: Both upper extremities Wheelchair parts: Needs assistance Distance: 60 (rest, 40 ft, rest, 40 ft) Wheelchair Assistance Details (indicate cue type and reason): min assist for keeping w/c on straight path as pt provided propulsion; pt able to lock and unlock brakes with cues  Modified Rankin (Stroke Patients Only)       Balance Overall balance assessment: Needs assistance Sitting-balance support: No upper extremity supported Sitting balance-[name] Scale: Good Sitting balance - Comments: with long sitting in bed                                    Cognition Arousal/Alertness: Awake/alert Behavior During Therapy: WFL for tasks assessed/performed Overall Cognitive Status: Within Functional Limits for tasks assessed  General Comments: at times slow to respond        Exercises      General Comments        Pertinent Vitals/Pain Pain Assessment Pain Assessment: 0-10 Pain Score: 4  Pain Location: LLE and posterior rt thigh wounds Pain Descriptors / Indicators: Guarding, Sore Pain Intervention(s): Limited activity within patient's tolerance,  Monitored during session    Home Living                          Prior Function            PT Goals (current goals can now be found in the care plan section) Acute Rehab PT Goals Patient Stated Goal: Pt wants to get up into a w/c PT Goal Formulation: With patient Time For Goal Achievement: 12/03/21 Potential to Achieve Goals: Fair Progress towards PT goals: Goals met and updated - see care plan (some goals met)    Frequency    Min 2X/week      PT Plan Current plan remains appropriate    Co-evaluation              AM-PAC PT "6 Clicks" Mobility   Outcome Measure  Help needed turning from your back to your side while in a flat bed without using bedrails?: Total Help needed moving from lying on your back to sitting on the side of a flat bed without using bedrails?: Total Help needed moving to and from a bed to a chair (including a wheelchair)?: Total Help needed standing up from a chair using your arms (e.g., wheelchair or bedside chair)?: Total Help needed to walk in hospital room?: Total Help needed climbing 3-5 steps with a railing? : Total 6 Click Score: 6    End of Session Equipment Utilized During Treatment: Oxygen;Gait belt (pt requested incr to 4L during exertion) Activity Tolerance: Patient tolerated treatment well Patient left: with call bell/phone within reach;in chair;Other (comment) (with gait belt around pt's chest and around w/c to create chest strap for safety while sitting up)   PT Visit Diagnosis: Other abnormalities of gait and mobility (R26.89);Muscle weakness (generalized) (M62.81) Pain - Right/Left: Left Pain - part of body:  (residual limb)     Time: 7517-0017 PT Time Calculation (min) (ACUTE ONLY): 55 min  Charges:  $Therapeutic Activity: 8-22 mins $Wheel Chair Management: 23-37 mins                      [place]  Office [phone]    [name] 11/19/2021, 3:58 PM

## 2021-11-19 NOTE — Progress Notes (Signed)
Physical Therapy Treatment note  Impression: Patient tolerated sitting up in wheelchair a total of 1.25 hours. She was comfortable in chair with 2 air cushions in seat of chair (side by side), however after lifted back to bed she reported increased pain in Rt posterior thigh at site of her wound. Assisted pt to rotate more to her left side to further unweight right LE.    11/19/21 1611  PT Visit Information  Last PT Received On 11/19/21  Assistance Needed +1 (+2 for OOB via lift)  History of Present Illness Pt adm 7/28 with acute pulmonary edema. 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  Subjective Data  Patient Stated Goal Pt wants to get up into a w/c  Precautions  Precautions Fall  Precaution Comments bil AKA, 3L O2 at baseline, LUQ drain  Restrictions  Weight Bearing Restrictions Yes  RLE Weight Bearing NWB  LLE Weight Bearing NWB  Other Position/Activity Restrictions minimized pushing/pulling and weight through L hand, dressing now removed  Pain Assessment  Pain Assessment 0-10  Pain Score 7  Pain Location LLE and posterior rt thigh wounds  Pain Descriptors / Indicators Guarding;Sore  Pain Intervention(s) Limited activity within patient's tolerance;Monitored during session;Repositioned  Cognition  Arousal/Alertness Awake/alert  Behavior During Therapy WFL for tasks assessed/performed  Overall Cognitive Status Within Functional Limits for tasks assessed  General Comments at times slow to respond  Bed Mobility  Overal bed mobility Needs Assistance  Bed Mobility Supine to Sit  Supine to sit Min assist  General bed mobility comments pull to long sit with HOB elevated  Transfers  Overall transfer level Needs assistance  Equipment used  (maxisky)  Transfers Bed to chair/wheelchair/BSC  Transfer via Designer, television/film set (with amputee lift pad)  General transfer comment lifted back to  bed via maxi-sky after sitting up 1.25 hours (on air cushions-side by side in seat of w/c)  Ambulation/Gait  General Gait Details unable  Balance  Overall balance assessment Needs assistance  Sitting-balance support No upper extremity supported  Sitting balance-[name] Scale Good  Sitting balance - Comments with long sitting in bed  PT - End of Session  Equipment Utilized During Treatment Oxygen  Activity Tolerance Patient tolerated treatment well  Patient left with call bell/phone within reach;in bed   PT - Assessment/Plan  PT Plan Current plan remains appropriate  PT Visit Diagnosis Other abnormalities of gait and mobility (R26.89);Muscle weakness (generalized) (M62.81)  Pain - Right/Left Left  Pain - part of body  (residual limb)  PT Frequency (ACUTE ONLY) Min 2X/week  Follow Up Recommendations Skilled nursing-short term rehab (<3 hours/day)  Can patient physically be transported by private vehicle No  Assistance recommended at discharge Frequent or constant Supervision/Assistance  Patient can return home with the following Two people to help with walking and/or transfers;Two people to help with bathing/dressing/bathroom;Assist for transportation;Assistance with cooking/housework;Help with stairs or ramp for entrance  PT equipment Wheelchair (measurements PT);Wheelchair cushion (measurements PT);Other (comment);Hospital bed (Needs wide w/c with cushion and anti-tippers with ?offset wheels to reduce risk of tipping backwards)  AM-PAC PT "6 Clicks" Mobility Outcome Measure (Version 2)  Help needed turning from your back to your side while in a flat bed without using bedrails? 1  Help needed moving from lying on your back to sitting on the side of a flat bed without using bedrails? 1  Help needed moving to and from a bed  to a chair (including a wheelchair)? 1  Help needed standing up from a chair using your arms (e.g., wheelchair or bedside chair)? 1  Help needed to walk in hospital room?  1  Help needed climbing 3-5 steps with a railing?  1  6 Click Score 6  Consider Recommendation of Discharge To: CIR/SNF/LTACH  Progressive Mobility  What is the highest level of mobility based on the progressive mobility assessment? Level 2 (Chairfast) - Balance while sitting on edge of bed and cannot stand  Activity Transferred from chair to bed  PT Goal Progression  Progress towards PT goals Progressing toward goals  Acute Rehab PT Goals  PT Goal Formulation With patient  Time For Goal Achievement 12/03/21  Potential to Achieve Goals Fair  PT Time Calculation  PT Start Time (ACUTE ONLY) 1529  PT Stop Time (ACUTE ONLY) 1542  PT Time Calculation (min) (ACUTE ONLY) 13 min  PT General Charges  $$ ACUTE PT VISIT 1 Visit  PT Treatments  $Therapeutic Activity 8-22 mins     Arby Barrette, [place]  Office [phone]

## 2021-11-19 NOTE — Progress Notes (Signed)
Progress Note   Patient: Courtney Gray MKL:[id number] DOB: 10-26-1970 DOA: 11/01/2021     18 DOS: the patient was seen and examined on 11/19/2021   Brief hospital course: 51 y.o. female with medical history significant of ESRD on HD TTS, recent bacteremia and hepatic abscess on long-term IV antibiotics, chronic HF PEF, PVD status post bilateral AKA, IIDM, morbid obesity, chronic sacral wound stage II, presented with increasing shortness of breath.   Patient underwent bilateral AKA recently including right AKA in May and left AKA on 7/5.  And patient was recently diagnosed with septic shock secondary to bacteremia and liver abscess, for which patient has been on cefepime and Flagyl and the last dose will be August 4.  Recently, since patient has bilateral AKA, she has had extreme difficulty to get access to outpatient HD center.  She never missed her dialysis however according to outpatient HD records, it takes at least 4 staff members and [facility] lift for patient to be seated in the dialysis chair.  Starting today, patient started to feel shortness of breath, no chest pain no swelling.  No cough no fever or chills. She is now awaiting placement for HD that takes stretcher and is hoping to find one locally.  Assessment and Plan: Acute on chronic HFpEF with chronic hypoxemic respiratory failure -Secondary to uncontrolled hypertension, clinical has signs of mild fluid overload at presentation .-Imdur was started at presentation, as well as low dose metoprolol -BP recently soft during HD, thus imdur held. BP stable at this time -Continue to manage volume status via HD    HTN emergency -Had been continued on isosorbide and metoprolol. -BP noted to be soft during HD, thus imdur stopped -Continued on low dose metoprolol -BP now stable   ESRD on HD -Admitting physician discussed with ED physician and case manager, likely patient will need long-term dialysis plan as currently, increasingly, outpatient  HD center had hard time to accommodate patient's dialysis.  -Ongoing HD as per nephrology team.  Their input appreciated. -Discussed in detail with TOC during morning rounds and they are exploring options, will be difficult to place. -Patient reports that she recently moved from [place] to be with her daughter in [place].  They insist on finding a place in [place].  Daughter does not have a car even to drive if she is placed far away.  Advised them that [facility] priority will be to find placement locally.  However also advised them that given multiple barriers, they may have to consider alternate/distant options. -TOC continues to work on placement   Anemia of CKD -Iron studies ordered per nephrology, noted to be 75 -Nephrology starting IV iron weekly. -Hgb stable   Constipation -Bisacodyl suppository as MiraLAX does not work well for her -Patient has refused suppository as well as enema -Recently adjusted bowel regimen.   Nausea/vomiting -Resolved.   IIDM -Cont SSI as needed -Reasonably controlled   Body mass index is 62.73 kg/m./Morbid obesity -Recommend diet/lifestyle modification   Stage II sacral wound -Chronic, WOC consulted   Pressure Injury 09/11/21 Buttocks Right Stage 2 -  Partial thickness loss of dermis presenting as a shallow open injury with a red, pink wound bed without slough. pink (Active)  09/11/21 0000  Location: Buttocks  Location Orientation: Right  Staging: Stage 2 -  Partial thickness loss of dermis presenting as a shallow open injury with a red, pink wound bed without slough.  Wound Description (Comments): pink  Present on Admission: Yes     Pressure Injury  11/12/21 Hip Right Unstageable - Full thickness tissue loss in which the base of the injury is covered by slough (yellow, tan, gray, green or brown) and/or eschar (tan, brown or black) in the wound bed. 90% covered with slough (Active)  11/12/21 0800  Location: Hip  Location Orientation: Right   Staging: Unstageable - Full thickness tissue loss in which the base of the injury is covered by slough (yellow, tan, gray, green or brown) and/or eschar (tan, brown or black) in the wound bed.  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 On recent admit, developed septic shock from bacteroides thetaiotaomicron bacteremia with septic emboli to spleen, initially on anticoagulation which was later held as splenic infarct was determined to be infectious and not blood clot -Pt receiving IV cefepime on HD and flagyl, completed 8/4 -Dr. Algis Liming consulted IR to evaluate abd drain in place.  IR was consulted and drain was removed 8/9 Patient missed ID clinic appointment on 7/28.  Appreciate input by ID here. No indication for abx at this time, IR to remove drain per ID. ID has since signed off -Earlier abd discomfort has improved since drain was removed   PAD -Pt s/p B AKA for gangrene with plan to f/u with vascular surgery in 1 month (with Dr. Stanford Breed on 11/21/2021 for staple removal) -Pt was s/p L middle finger amputation for gangrene at recent admit    Hyperlipidemia: -Continue atorvastatin.   Chronic respiratory failure with hypoxia: -Patient reports that she uses 4 L/min home oxygen PTA.       Subjective: Complains of intermitted pains over known pressure sores as she lays on them  Physical Exam: Vitals:   11/18/21 1800 11/18/21 2059 11/19/21 0511 11/19/21 0834  BP:  132/71 119/62 (!) 134/48  Pulse: 78 82 73 71  Resp: 18 19 18 17   Temp:  98.2 F (36.8 C) 97.8 F (36.6 C) 98.4 F (36.9 C)  TempSrc:  Oral Oral Oral  SpO2: 100%  100% 99%  Weight:      Height:       General exam: Awake, laying in bed, in nad Respiratory system: Normal respiratory effort, no wheezing Cardiovascular system:  regular rate, s1, s2 Gastrointestinal system: Soft, nondistended, positive BS Central nervous system: CN2-12 grossly intact, strength intact Extremities: Perfused, no clubbing Skin: Normal skin turgor, large lower decub ulcers with good granulation Psychiatry: Mood normal // no visual hallucinations    Data Reviewed:  There are no new results to review at this time.  Family Communication: Pt in room, family at bedside  Disposition: Status is: Inpatient Remains inpatient appropriate because: Severity of illness  Planned Discharge Destination: Skilled nursing facility     Author: Marylu Lund, MD 11/19/2021 3:47 PM  For on call review [email or web address].

## 2021-11-19 NOTE — TOC Progression Note (Signed)
Transition of Care [facility]) - Initial/Assessment Note    Patient Details  Name: Courtney Gray MRN: [id number] Date of Birth: 08-15-1970  Transition of Care [facility]) CM/SW Contact:    Milinda Antis, [place] Phone Number: 11/19/2021, 4:16 PM  Clinical Narrative:                 A dialysis clinic that can provide stretcher dialysis has not been located as of yet.  Patient updated.  TOC will continue to follow.    Expected Discharge Plan: Long Term Nursing Home Barriers to Discharge: No SNF bed, Transportation, Waiting for outpatient dialysis   Patient Goals and CMS Choice Patient states their goals for this hospitalization and ongoing recovery are:: To be at a facility close to her daughter      Expected Discharge Plan and Services Expected Discharge Plan: [place]       Living arrangements for the past 2 months: Single Family Home                                      Prior Living Arrangements/Services Living arrangements for the past 2 months: Single Family Home Lives with:: Adult Children Patient language and need for interpreter reviewed:: Yes        Need for Family Participation in Patient Care: Yes (Comment) Care giver support system in place?: No (comment)   Criminal Activity/Legal Involvement Pertinent to Current Situation/Hospitalization: No - Comment as needed  Activities of Daily Living Home Assistive Devices/Equipment: [employer] fast streamer ADL Screening (condition at time of admission) Patient's cognitive ability adequate to safely complete daily activities?: No Is the patient deaf or have difficulty hearing?: No Does the patient have difficulty seeing, even when wearing glasses/contacts?: No Does the patient have difficulty concentrating, remembering, or making decisions?: No Patient able to express need for assistance with ADLs?: Yes Does the patient have difficulty dressing or bathing?: Yes Independently performs ADLs?: No Does the patient have  difficulty walking or climbing stairs?: Yes Weakness of Legs: Both Weakness of Arms/Hands: Both  Permission Sought/Granted Permission sought to share information with : Other (comment) Permission granted to share information with : Yes, Verbal Permission Granted  Share Information with NAME: Sherrow,antoinette (Daughter)   [phone]  Permission granted to share info w AGENCY: SNF        Emotional Assessment Appearance:: Appears older than stated age Attitude/Demeanor/Rapport: Engaged Affect (typically observed): Tearful/Crying, Accepting Orientation: : Oriented to Situation, Oriented to  Time, Oriented to Place, Oriented to Self Alcohol / Substance Use: Not Applicable Psych Involvement: No (comment)  Admission diagnosis:  End stage renal disease ([place]) [N18.6] Fluid overload [E87.70] Obesity with serious comorbidity, unspecified classification, unspecified obesity type [E66.9] Pressure injury of right buttock, stage 2 (HCC) [L89.312] Patient Active Problem List   Diagnosis Date Noted   Hypertension    Type 2 diabetes mellitus with hyperlipidemia ([place])    Decubitus ulcer of sacral region, stage 2 ([place])    Class 3 obesity ([place])    Bacteremia    PVD (peripheral vascular disease) ([place])    Hepatic abscess    Pressure injury of skin 09/13/2021   Hx of AKA (above knee amputation), right ([place])    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) 09/10/2021   Coronary artery disease involving native coronary artery of native heart without angina pectoris  09/10/2021   Elevated troponin level not due myocardial infarction 09/10/2021   Hypotension 09/10/2021   Dry gangrene (HCC)    Pain 08/03/2021   Ischemic foot pain at rest 08/03/2021   Hypoglycemia 07/10/2021   Volume overload 07/09/2021   Leukocytosis 06/11/2021   Acute gout 06/11/2021   Hyperkalemia 05/21/2021   Hemorrhoids     Hyperlipidemia 05/18/2021   Rectal bleeding 05/17/2021   Morbid obesity with BMI of 60.0-69.9, adult ([place]) 05/17/2021   NSTEMI (non-ST elevated myocardial infarction) ([place]) 05/16/2021   Essential hypertension 09/01/2020   End stage renal disease ([place]) 09/01/2020   Chronic respiratory failure with hypoxia ([place]) 09/01/2020   Hypertensive urgency 11/28/2019   Uncontrolled type 2 diabetes mellitus with hypoglycemia, with long-term current use of insulin ([place]) 11/28/2019   Anemia of chronic disease 11/28/2019   PCP:  Monico Blitz, MD Pharmacy:   [place], [place] [address] [place] [address] Phone: [phone] Fax: [phone]  [facility] [address] [place] [address] Phone: [phone] Fax: [phone]  [place], [place] - [address] [place] [address] Phone: [phone] Fax: [phone]     Social Determinants of Health ([place]) Interventions    Readmission Risk Interventions    09/16/2021    4:36 PM 08/28/2021   11:50 AM 07/12/2021   12:38 PM  Readmission Risk Prevention Plan  Transportation Screening Complete Complete Complete  Medication Review (RN Care Manager)  Complete Complete  PCP or Specialist appointment within 3-5 days of discharge  Complete Complete  HRI or Home Care Consult Complete Complete Complete  SW Recovery Care/Counseling Consult Complete Complete Complete  Palliative Care Screening  Not Applicable Not Applicable  Skilled Nursing Facility Complete Not Applicable Complete

## 2021-11-20 DIAGNOSIS — R7881 Bacteremia: Secondary | ICD-10-CM | POA: Diagnosis not present

## 2021-11-20 DIAGNOSIS — N186 End stage renal disease: Secondary | ICD-10-CM | POA: Diagnosis not present

## 2021-11-20 DIAGNOSIS — L89152 Pressure ulcer of sacral region, stage 2: Secondary | ICD-10-CM | POA: Diagnosis not present

## 2021-11-20 LAB — RENAL FUNCTION PANEL
Albumin: 2.2 g/dL — ABNORMAL LOW (ref 3.5–5.0)
Anion gap: 10 (ref 5–15)
BUN: 38 mg/dL — ABNORMAL HIGH (ref 6–20)
CO2: 27 mmol/L (ref 22–32)
Calcium: 8.5 mg/dL — ABNORMAL LOW (ref 8.9–10.3)
Chloride: 98 mmol/L (ref 98–111)
Creatinine, Ser: 6.87 mg/dL — ABNORMAL HIGH (ref 0.44–1.00)
GFR, Estimated: 7 mL/min — ABNORMAL LOW (ref >60)
Glucose, Bld: 126 mg/dL — ABNORMAL HIGH (ref 70–99)
Phosphorus: 6.3 mg/dL — ABNORMAL HIGH (ref 2.5–4.6)
Potassium: 3.9 mmol/L (ref 3.5–5.1)
Sodium: 135 mmol/L (ref 135–145)

## 2021-11-20 LAB — CBC
HCT: 25.8 % — ABNORMAL LOW (ref 36.0–46.0)
Hemoglobin: 7.8 g/dL — ABNORMAL LOW (ref 12.0–15.0)
MCH: 29.5 pg (ref 26.0–34.0)
MCHC: 30.2 g/dL (ref 30.0–36.0)
MCV: 97.7 fL (ref 80.0–100.0)
Platelets: 211 10*3/uL (ref 150–400)
RBC: 2.64 MIL/uL — ABNORMAL LOW (ref 3.87–5.11)
RDW: 17.2 % — ABNORMAL HIGH (ref 11.5–15.5)
WBC: 11.9 10*3/uL — ABNORMAL HIGH (ref 4.0–10.5)
nRBC: 0 % (ref 0.0–0.2)

## 2021-11-20 LAB — GLUCOSE, CAPILLARY
Glucose-Capillary: 108 mg/dL — ABNORMAL HIGH (ref 70–99)
Glucose-Capillary: 141 mg/dL — ABNORMAL HIGH (ref 70–99)
Glucose-Capillary: 130 mg/dL — ABNORMAL HIGH (ref 70–99)

## 2021-11-20 MED ORDER — HEPARIN SODIUM (PORCINE) 1000 UNIT/ML DIALYSIS
1000.0000 [IU] | INTRAMUSCULAR | Status: DC | PRN
  Administered 2021-11-20: 5000 [IU]
  Filled 2021-11-20 (×3): qty 1

## 2021-11-20 MED ORDER — HEPARIN SODIUM (PORCINE) 1000 UNIT/ML DIALYSIS
20.0000 [IU]/kg | INTRAMUSCULAR | Status: DC | PRN
  Administered 2021-11-20: 3000 [IU] via INTRAVENOUS_CENTRAL
  Filled 2021-11-20: qty 3

## 2021-11-20 MED ORDER — ANTICOAGULANT SODIUM CITRATE 4% (200MG/5ML) IV SOLN
5.0000 mL | Status: DC | PRN
  Filled 2021-11-20: qty 5

## 2021-11-20 MED ORDER — ALTEPLASE 2 MG IJ SOLR: 2.0000 mg | Freq: Once | INTRAMUSCULAR | Status: DC | PRN

## 2021-11-20 NOTE — Progress Notes (Addendum)
Occupational Therapy Treatment Patient Details Name: Courtney Gray MRN: [id number] DOB: 12-Sep-1970 Today's Date: 11/20/2021   History of present illness Pt adm 7/28 with acute pulmonary edema. 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   OT comments  Pt progressing towards goals, pt seen after HD session in conjuction with MT for use of maxisky lift. Pt able to complete bed mobility with max A for placement of lift pad, transferred to w/c with use of Maxisky. Pt c/o R bottom/wound pain, however agreeable to sit up for lunch. Mobility tech +2 planning to return to assist pt back to bed in ~1 hr. Pt presenting with impairments listed below, will follow acutely. Continue to recommend OT at [facility] at d/c.   Recommendations for follow up therapy are one component of a multi-disciplinary discharge planning process, led by the attending physician.  Recommendations may be updated based on patient status, additional functional criteria and insurance authorization.    Follow Up Recommendations  OT at Long-term acute care hospital    Assistance Recommended at Discharge Intermittent Supervision/Assistance  Patient can return home with the following  Two people to help with walking and/or transfers;Two people to help with bathing/dressing/bathroom;Assistance with cooking/housework;Direct supervision/assist for medications management;Assist for transportation;Help with stairs or ramp for entrance   Equipment Recommendations  Other (comment) (defer)    Recommendations for Other Services PT consult    Precautions / Restrictions Precautions Precautions: Fall Precaution Comments: bil AKA, 3L O2 at baseline, LUQ drain Restrictions Weight Bearing Restrictions: Yes RLE Weight Bearing: Non weight bearing LLE Weight Bearing: Non weight bearing       Mobility Bed Mobility Overal bed mobility: Needs Assistance    Rolling: Max assist         General bed mobility comments: for maxisky pad placement    Transfers Overall transfer level: Needs assistance   Transfers: Bed to chair/wheelchair/BSC             General transfer comment: lifted OOB to chair via maxisky after HD, air cushions in seate Transfer via Lift Equipment: Maxisky (amputee lift pad)   Balance Overall balance assessment: Needs assistance Sitting-balance support: No upper extremity supported Sitting balance-[name] Scale: Good Sitting balance - Comments: with long sitting in bed                                   ADL either performed or assessed with clinical judgement   ADL Overall ADL's : Needs assistance/impaired Eating/Feeding: Set up;Sitting                       Toilet Transfer: Maximal assistance;+2 for physical assistance;Requires wide/bariatric (use of lift to w/c)           Functional mobility during ADLs: Maximal assistance;+2 for safety/equipment      Extremity/Trunk Assessment Upper Extremity Assessment Upper Extremity Assessment: Generalized weakness (impaired fine motor coordination)   Lower Extremity Assessment Lower Extremity Assessment: Defer to PT evaluation        Vision   Vision Assessment?: No apparent visual deficits   Perception Perception Perception: Not tested   Praxis Praxis Praxis: Not tested    Cognition Arousal/Alertness: Awake/alert Behavior During Therapy: WFL for tasks assessed/performed Overall Cognitive Status: Within Functional Limits for tasks assessed  General Comments: at times slow to respond        Exercises      Shoulder Instructions       General Comments VSS on 4L O2    Pertinent Vitals/ Pain       Pain Assessment Pain Assessment: Faces Pain Score: 8  Faces Pain Scale: Hurts whole lot Pain Location: R bottom wound Pain Descriptors / Indicators: Guarding, Sore Pain  Intervention(s): Limited activity within patient's tolerance, Monitored during session, Repositioned  Home Living                                          Prior Functioning/Environment              Frequency  Min 2X/week        Progress Toward Goals  OT Goals(current goals can now be found in the care plan section)  Progress towards OT goals: Progressing toward goals  Acute Rehab OT Goals Patient Stated Goal: to get better OT Goal Formulation: With patient Time For Goal Achievement: 11/17/21 Potential to Achieve Goals: Fair ADL Goals Pt Will Perform Grooming: Independently;sitting;bed level Pt Will Perform Lower Body Bathing: with mod assist;with adaptive equipment;sitting/lateral leans Pt Will Perform Lower Body Dressing: with mod assist;with adaptive equipment;sitting/lateral leans Pt Will Transfer to Toilet: with mod assist;with transfer board;bedside commode Pt/caregiver will Perform Home Exercise Program: Increased strength;Both right and left upper extremity;With theraband;Independently Additional ADL Goal #1: Pt will perform bed mobility with supervision in prep for ADLs Additional ADL Goal #2: Pt will tolerate sitting up in wheelchair/recliner for 1.5 hours in order to improve activity tolerance for ADLs/mobility.  Plan Discharge plan remains appropriate;Frequency remains appropriate    Co-evaluation                 AM-PAC OT "6 Clicks" Daily Activity     Outcome Measure   Help from another person eating meals?: A Little Help from another person taking care of personal grooming?: A Little Help from another person toileting, which includes using toliet, bedpan, or urinal?: Total Help from another person bathing (including washing, rinsing, drying)?: A Lot Help from another person to put on and taking off regular upper body clothing?: A Little Help from another person to put on and taking off regular lower body clothing?: A Lot 6 Click  Score: 14    End of Session Equipment Utilized During Treatment: Oxygen;Other (comment) (maxisky)  OT Visit Diagnosis: Muscle weakness (generalized) (M62.81);History of falling (Z91.81) Pain - Right/Left: Right Pain - part of body: Leg   Activity Tolerance Patient tolerated treatment well   Patient Left in chair (w/c, brakes locked with gait belt around pt per pt request);with call bell/phone within reach   Nurse Communication Mobility status        Time: 2549-8264 OT Time Calculation (min): 26 min  Charges: OT General Charges $OT Visit: 1 Visit OT Treatments $Therapeutic Activity: 23-37 mins  [name], OTD, OTR/L Acute Rehab [phone] - [place] 11/20/2021, 4:12 PM

## 2021-11-20 NOTE — Progress Notes (Signed)
PROGRESS NOTE    Courtney Gray  XVQ:[id number] DOB: October 10, 1970 DOA: 11/01/2021 PCP: Monico Blitz, MD    Chief Complaint  Patient presents with   Vascular Access Problem    Brief Narrative: 51 y.o. female with medical history significant of ESRD on HD TTS, recent bacteremia and hepatic abscess on long-term IV antibiotics, chronic HF PEF, PVD status post bilateral AKA, IIDM, morbid obesity, chronic sacral wound stage II, presented with increasing shortness of breath.   Patient underwent bilateral AKA recently including right AKA in May and left AKA on 7/5.  And patient was recently diagnosed with septic shock secondary to bacteremia and liver abscess, for which patient has been on cefepime and Flagyl and the last dose will be August 4.  Recently, since patient has bilateral AKA, she has had extreme difficulty to get access to outpatient HD center.  She never missed her dialysis however according to outpatient HD records, it takes at least 4 staff members and [facility] lift for patient to be seated in the dialysis chair.  Starting today, patient started to feel shortness of breath, no chest pain no swelling.  No cough no fever or chills. She is now awaiting placement for HD that takes stretcher and is hoping to find one locally.   Assessment & Plan:   Active Problems:   End stage renal disease ([place])   Hypertension   Decubitus ulcer of sacral region, stage 2 (HCC)   Type 2 diabetes mellitus with hyperlipidemia (HCC)   Bacteremia   PVD (peripheral vascular disease) (HCC)   Class 3 obesity (HCC)  #1 acute on chronic diastolic CHF with chronic hypoxemic respiratory failure -Secondary to uncontrolled hypertension, patient with signs of mild volume overload on presentation. -Continue Imdur, metoprolol. -BP noted to be soft during HD-/Imdur held. -Volume management per HD.  2.  Hypertensive emergency -Was on Imdur, metoprolol. -Due to soft blood pressure during HD Imdur held. -Continue  Lopressor. -May need to decrease Lopressor pending BP.  3.  ESRD on HD -On admission admitting physician discussed with ED physician and case manager that patient will likely need long-term dialysis plan as patient currently in outpatient HD center had hard time accommodating patient's dialysis. -Nephrology consulted and managing hemodialysis. -TOC following and exploring options. -It is noted that patient recently moved from [place] to be with her daughter in [place] and insistent on finding a place in [place]. -It is noted that patient's daughter does not have a car even to drive patient if she is placed far away. -TOC following and helping with placement.  4.  Anemia of CKD -IV iron weekly per nephrology. -H&H stable.  5.  Constipation -Collect suppository is noted MiraLAX does not work well for patient. -Patient noted to have refused suppository and enema. -Continue bowel regimen.  6.  Nausea/vomiting -Resolved.  7.  Well-controlled insulin-dependent diabetes mellitus -Hemoglobin A1c 5.7 -SSI.  8.  Morbid obesity -Lifestyle modification -Outpatient follow-up.  9.  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 -Patient receiving IV cefepime on HD and Flagyl which was completed 8/4. -Dr. Almyra Free already consulted IR to evaluate for abdominal drain that was in place IR seen and drain removed 8/9. -Patient noted to have missed ID clinic appointment on 11/01/2021. -Patient  seen by ID no indication for further antibiotics at this time, IR to remove drain per ID, ID has since signed off. -Abdominal discomfort improved post drain removal.  10.  PAD -Status post  bilateral AKA for gangrene with plan to follow-up with vascular surgery in a month (Dr. Debe Coder on 11/21/2021 for staple removal) -Patient is status post left middle finger amputation for gangrene and recent admission.  11.  Hyperlipidemia -Statin.  12.  Chronic respiratory failure with hypoxia -Patient noted to report uses 4 L/Min home O2 prior to admission  13. Stage II sacral wound -Wound care consulted. Pressure Injury 09/11/21 Buttocks Right Stage 2 -  Partial thickness loss of dermis presenting as a shallow open injury with a red, pink wound bed without slough. pink (Active)  09/11/21 0000  Location: Buttocks  Location Orientation: Right  Staging: Stage 2 -  Partial thickness loss of dermis presenting as a shallow open injury with a red, pink wound bed without slough.  Wound Description (Comments): pink  Present on Admission: Yes     Pressure Injury 11/12/21 Hip Right Unstageable - Full thickness tissue loss in which the base of the injury is covered by slough (yellow, tan, gray, green or brown) and/or eschar (tan, brown or black) in the wound bed. 90% covered with slough (Active)  11/12/21 0800  Location: Hip  Location Orientation: Right  Staging: Unstageable - Full thickness tissue loss in which the base of the injury is covered by slough (yellow, tan, gray, green or brown) and/or eschar (tan, brown or black) in the wound bed.  Wound Description (Comments): 90% covered with slough  Present on Admission: Yes (per RN)     -CBG 108 this morning.   DVT prophylaxis: Heparin Code Status: Full Family Communication: Updated patient.  No family at bedside. Disposition:   Status is: Inpatient Remains inpatient appropriate because: Severely of illness   Consultants:  Nephrology: Dr. Moshe Cipro 11/01/2021 Wound care RN, Cecille Rubin McNichol 11/03/2021 ID: Dr.[email or web address] 11/13/2021  Procedures:  CT abdomen and pelvis 11/13/2021 Chest x-ray 11/01/2021  Antimicrobials:  Anti-infectives  (From admission, onward)    Start     Dose/Rate Route Frequency Ordered Stop   11/04/21 1800  ceFEPIme (MAXIPIME) 2 g in sodium chloride 0.9 % 100 mL IVPB        2 g 200 mL/hr over 30 Minutes Intravenous Every M-W-F (1800) 11/03/21 5809 11/08/21 2359   11/04/21 1200  ceFEPIme (MAXIPIME) 2 g in sodium chloride 0.9 % 100 mL IVPB  Status:  Discontinued        2 g 200 mL/hr over 30 Minutes Intravenous Every M-W-F (Hemodialysis) 11/02/21 0941 11/03/21 0952   11/02/21 1800  ceFEPIme (MAXIPIME) 2 g in sodium chloride 0.9 % 100 mL IVPB        2 g 200 mL/hr over 30 Minutes Intravenous  Once 11/02/21 0941 11/02/21 1812   11/02/21 1200  ceFEPIme (MAXIPIME) 2 g in sodium chloride 0.9 % 100 mL IVPB  Status:  Discontinued        2 g 200 mL/hr over 30 Minutes Intravenous Every T-Th-Sa (Hemodialysis) 11/01/21 1934 11/02/21 0941   11/01/21 2200  metroNIDAZOLE (FLAGYL) tablet 500 mg        500 mg Oral Every 12 hours 11/01/21 1827 11/08/21 2225   11/01/21 1945  ceFEPIme (MAXIPIME) 1 g in sodium chloride 0.9 % 100 mL IVPB        1 g 200 mL/hr over 30 Minutes Intravenous  Once 11/01/21 1933 11/01/21 2032  11/01/21 1730  ceFEPIme (MAXIPIME) 1 g in sodium chloride 0.9 % 100 mL IVPB  Status:  Discontinued        1 g 200 mL/hr over 30 Minutes Intravenous Every 24 hours 11/01/21 1720 11/01/21 1933         Subjective: In HD c/o sacral wound discomfort/pain. No CP. SOB improving.   Objective: Vitals:   11/20/21 1100 11/20/21 1130 11/20/21 1200 11/20/21 1230  BP: (!) 120/48 (!) 127/45 (!) 117/47 (!) 107/92  Pulse:      Resp:      Temp:      TempSrc:      SpO2:      Weight:      Height:        Intake/Output Summary (Last 24 hours) at 11/20/2021 1247 Last data filed at 11/20/2021 0700 Gross per 24 hour  Intake 660 ml  Output --  Net 660 ml   Filed Weights   11/18/21 1330 11/20/21 0000 11/20/21 0847  Weight: 125.6 kg 125.6 kg 125.2 kg    Examination:  General exam: Appears calm and  comfortable  Respiratory system: Clear to auscultation. Respiratory effort normal. Cardiovascular system: S1 & S2 heard, RRR. No JVD, murmurs, rubs, gallops or clicks. No pedal edema. Gastrointestinal system: Abdomen is nondistended, soft and nontender. No organomegaly or masses felt. Normal bowel sounds heard. Central nervous system: Alert and oriented. No focal neurological deficits. Extremities: Symmetric 5 x 5 power.B AKA Skin: Large lower decubitus ulcers with good granulation.  Psychiatry: Judgement and insight appear normal. Mood & affect appropriate.     Data Reviewed: I have personally reviewed following labs and imaging studies  CBC: Recent Labs  Lab 11/15/21 0750 11/18/21 1412 11/20/21 0906  WBC 10.8* 13.2* 11.9*  HGB 8.3* 8.4* 7.8*  HCT 27.3* 27.7* 25.8*  MCV 96.5 96.2 97.7  PLT 175 208 517    Basic Metabolic Panel: Recent Labs  Lab 11/15/21 0751 11/18/21 1430 11/20/21 0906  NA 131* 135 135  K 3.3* 4.0 3.9  CL 96* 99 98  CO2 26 26 27   GLUCOSE 132* 128* 126*  BUN 25* 42* 38*  CREATININE 5.75* 7.96* 6.87*  CALCIUM 8.4* 8.7* 8.5*  PHOS 4.9* 5.8* 6.3*    GFR: Estimated Creatinine Clearance: 12.9 mL/min (A) (by C-G formula based on SCr of 6.87 mg/dL (H)).  Liver Function Tests: Recent Labs  Lab 11/15/21 0751 11/18/21 1430 11/20/21 0906  ALBUMIN 2.2* 2.3* 2.2*    CBG: Recent Labs  Lab 11/19/21 0725 11/19/21 1210 11/19/21 1628 11/19/21 2147 11/20/21 0727  GLUCAP 95 131* 154* 140* 108*     No results found for this or any previous visit (from the past 240 hour(s)).       Radiology Studies: No results found.      Scheduled Meds:  atorvastatin  10 mg Oral Daily   darbepoetin (ARANESP) injection - DIALYSIS  100 mcg Intravenous Q Fri-HD   docusate sodium  100 mg Oral BID   feeding supplement  1 Container Oral TID BM   gabapentin  100 mg Oral BID   heparin  5,000 Units Subcutaneous Q12H   insulin aspart  0-5 Units Subcutaneous QHS    insulin aspart  0-6 Units Subcutaneous TID WC   leptospermum manuka honey  1 Application Topical Daily   levothyroxine  100 mcg Oral Q0600   metoprolol tartrate  25 mg Oral BID   mouth rinse  15 mL Mouth Rinse 4 times per day   pantoprazole  40 mg Oral Daily   polyethylene glycol  17 g Oral BID   senna  1 tablet Oral Daily   Continuous Infusions:  anticoagulant sodium citrate     ferric gluconate (FERRLECIT) IVPB 125 mg (11/18/21 1422)     LOS: 19 days    Time spent: 40 minutes    Irine Seal, MD [facility]   To contact the attending provider between 7A-7P or the covering provider during after hours 7P-7A, please log into the web site [email or web address] and access using universal [place] password for that web site. If you do not have the password, please call the hospital operator.  11/20/2021, 12:47 PM

## 2021-11-20 NOTE — Progress Notes (Signed)
Attempted to reach Courtney [facility] with [facility] to obtain update regarding pt's referral to [facility] clinic for stretcher HD. Unable to reach any staff member this afternoon and will attempt again tomorrow.   Melven Sartorius Renal Navigator [phone]

## 2021-11-20 NOTE — Progress Notes (Addendum)
Received patient in bed to unit.oxygen 2 liters in place.   Alert and oriented.  Informed consent signed and in  chart.   Treatment initiated: 0903 Treatment completed: 1400  Patient tolerated well.  Transported back to the room  alert, without acute distress, oxygen 2 liters nasal cannula in place Hand-off given to patient's nurse.   Access used: HD catheter left chest Access issues:   Total UF removed: 1 Medication(s) given: oxycodone 5mg  , heparin 2500 units  Post HD VS: 1 Post HD weight: 1   [facility]

## 2021-11-20 NOTE — Progress Notes (Signed)
[facility] Progress Note   Subjective:   Seen in room. Completed dialysis yesterday with 3.5L off. No issues with dialysis. No complaints this am. Did well with PT yesterday.     Objective Vitals:   11/20/21 0847 11/20/21 0903 11/20/21 0930 11/20/21 1000  BP: (!) 119/46 (!) 120/51 (!) 119/46 (!) 132/49  Pulse: 78 76    Resp: 18 (!) 21    Temp: 98.9 F (37.2 C)     TempSrc: Oral     SpO2: 98% 97%    Weight: 125.2 kg     Height:       Physical Exam General: Alert, obese female in NAD Heart: RRR, no murmurs, rubs or gallops Lungs: CTA bilaterally without wheezing, rhonchi or rales Abdomen: Soft, non-distended, +BS Extremities: trace stump edema bilaterally Dialysis Access: [facility] with intact dressing  Additional Objective Labs: Basic Metabolic Panel: Recent Labs  Lab 11/15/21 0751 11/18/21 1430 11/20/21 0906  NA 131* 135 135  K 3.3* 4.0 3.9  CL 96* 99 98  CO2 26 26 27   GLUCOSE 132* 128* 126*  BUN 25* 42* 38*  CREATININE 5.75* 7.96* 6.87*  CALCIUM 8.4* 8.7* 8.5*  PHOS 4.9* 5.8* 6.3*   Liver Function Tests: Recent Labs  Lab 11/15/21 0751 11/18/21 1430 11/20/21 0906  ALBUMIN 2.2* 2.3* 2.2*   No results for input(s): "LIPASE", "AMYLASE" in the last 168 hours. CBC: Recent Labs  Lab 11/15/21 0750 11/18/21 1412 11/20/21 0906  WBC 10.8* 13.2* 11.9*  HGB 8.3* 8.4* 7.8*  HCT 27.3* 27.7* 25.8*  MCV 96.5 96.2 97.7  PLT 175 208 211   Blood Culture    Component Value Date/Time   SDES BLOOD LEFT ANTECUBITAL 10/16/2021 1534   SPECREQUEST  10/16/2021 1534    BOTTLES DRAWN AEROBIC AND ANAEROBIC Blood Culture results may not be optimal due to an inadequate volume of blood received in culture bottles   CULT  10/16/2021 1534    NO GROWTH 5 DAYS Performed at [facility] [address] [place], [place] [address]    REPTSTATUS 10/21/2021 FINAL 10/16/2021 1534    Cardiac Enzymes: No results for input(s): "CKTOTAL", "CKMB", "CKMBINDEX",  "TROPONINI" in the last 168 hours. CBG: Recent Labs  Lab 11/19/21 0725 11/19/21 1210 11/19/21 1628 11/19/21 2147 11/20/21 0727  GLUCAP 95 131* 154* 140* 108*   Iron Studies: No results for input(s): "IRON", "TIBC", "TRANSFERRIN", "FERRITIN" in the last 72 hours. @lablastinr3 @ Studies/Results: No results found. Medications:  anticoagulant sodium citrate     ferric gluconate (FERRLECIT) IVPB 125 mg (11/18/21 1422)    atorvastatin  10 mg Oral Daily   darbepoetin (ARANESP) injection - DIALYSIS  100 mcg Intravenous Q Fri-HD   docusate sodium  100 mg Oral BID   feeding supplement  1 Container Oral TID BM   gabapentin  100 mg Oral BID   heparin  5,000 Units Subcutaneous Q12H   insulin aspart  0-5 Units Subcutaneous QHS   insulin aspart  0-6 Units Subcutaneous TID WC   leptospermum manuka honey  1 Application Topical Daily   levothyroxine  100 mcg Oral Q0600   metoprolol tartrate  25 mg Oral BID   mouth rinse  15 mL Mouth Rinse 4 times per day   pantoprazole  40 mg Oral Daily   polyethylene glycol  17 g Oral BID   senna  1 tablet Oral Daily    Dialysis Orders: MWF - [place] ---?transferring to [facility] 4hrs, BFR 400, DFR 600,  AVF, EDW 146kg, 3K/ 2Ca - Heparin 2500 units with HD - Hectorol 81mcg IV qHD, held as of 10/30/21  Assessment/Plan: ESRD - on HD MWF. HD 8/16.  2. Hypertension/volume - BP variable.  UF as able with HD.  Metoprolol 25mg  BID started 11/04/21.  Wide variation in recorded bed weights. Continue 3-4L goals as tolerated. 3. Hypokalemia: Requiring intermittent supplementation.  Using 3K bath with HD but 4K is not available. supplement PRN. K stable today 3.9 4. Anemia of CKD - Hgb 8s. transferrin too low to calculate tsat. On IV iron weekly for now since she is off IV abx. Aranesp q Friday.  5. Secondary Hyperparathyroidism - Hectorol on hold, getting Tums 1000mg  TID as binder -> reduced dose to 500mg  TID. Ca/Phos ok.  6. Nutrition - Renal diet, have  added supplements. Alb very low. 7. Dispo - Very difficulty situation. Patient required 4 staff members and 2 hoyer pads for transfer into HD chair as OP. Not staying on HD for full treatment d/t pain for multiple decubitus ulcers. This is a major safety concern for staff and patient in the outpatient center. Also concern for inability for ulcers to heal properly d/t frequent and rigorous transfers that are required. Need to determine new placement at a facility where she can receive HD in the bed-more likley a LTAC if possible.   8. Recent septic shock 2/2 bacteremia/liver abscess - Now s/p course of IV cefepime as of 11/08/21.  Gean Quint, MD [facility]

## 2021-11-20 NOTE — Progress Notes (Addendum)
OT Cancellation Note  Patient Details Name: Fatimah Sundquist MRN: [id number] DOB: 02/03/1971   Cancelled Treatment:    Reason Eval/Treat Not Completed: Patient at procedure or test/ unavailable (pt off unit at HD, will follow up as schedule permits)  0488: Pt still in HD  [name], OTD, OTR/L Acute Rehab [phone]  Kaylyn Lim 11/20/2021, 9:35 AM

## 2021-11-21 DIAGNOSIS — L89152 Pressure ulcer of sacral region, stage 2: Secondary | ICD-10-CM | POA: Diagnosis not present

## 2021-11-21 DIAGNOSIS — R7881 Bacteremia: Secondary | ICD-10-CM | POA: Diagnosis not present

## 2021-11-21 DIAGNOSIS — N186 End stage renal disease: Secondary | ICD-10-CM | POA: Diagnosis not present

## 2021-11-21 LAB — CBC WITH DIFFERENTIAL/PLATELET
Abs Immature Granulocytes: 0.06 10*3/uL (ref 0.00–0.07)
Basophils Absolute: 0.1 10*3/uL (ref 0.0–0.1)
Basophils Relative: 1 %
Eosinophils Absolute: 0.5 10*3/uL (ref 0.0–0.5)
Eosinophils Relative: 5 %
HCT: 27.3 % — ABNORMAL LOW (ref 36.0–46.0)
Hemoglobin: 8.5 g/dL — ABNORMAL LOW (ref 12.0–15.0)
Immature Granulocytes: 1 %
Lymphocytes Relative: 13 %
Lymphs Abs: 1.5 10*3/uL (ref 0.7–4.0)
MCH: 29.9 pg (ref 26.0–34.0)
MCHC: 31.1 g/dL (ref 30.0–36.0)
MCV: 96.1 fL (ref 80.0–100.0)
Monocytes Absolute: 1.7 10*3/uL — ABNORMAL HIGH (ref 0.1–1.0)
Monocytes Relative: 15 %
Neutro Abs: 7.6 10*3/uL (ref 1.7–7.7)
Neutrophils Relative %: 65 %
Platelets: 209 10*3/uL (ref 150–400)
RBC: 2.84 MIL/uL — ABNORMAL LOW (ref 3.87–5.11)
RDW: 17.1 % — ABNORMAL HIGH (ref 11.5–15.5)
WBC: 11.4 10*3/uL — ABNORMAL HIGH (ref 4.0–10.5)
nRBC: 0 % (ref 0.0–0.2)

## 2021-11-21 LAB — RENAL FUNCTION PANEL
Albumin: 2.2 g/dL — ABNORMAL LOW (ref 3.5–5.0)
Anion gap: 10 (ref 5–15)
BUN: 27 mg/dL — ABNORMAL HIGH (ref 6–20)
CO2: 28 mmol/L (ref 22–32)
Calcium: 8.7 mg/dL — ABNORMAL LOW (ref 8.9–10.3)
Chloride: 97 mmol/L — ABNORMAL LOW (ref 98–111)
Creatinine, Ser: 4.84 mg/dL — ABNORMAL HIGH (ref 0.44–1.00)
GFR, Estimated: 10 mL/min — ABNORMAL LOW (ref >60)
Glucose, Bld: 96 mg/dL (ref 70–99)
Phosphorus: 5 mg/dL — ABNORMAL HIGH (ref 2.5–4.6)
Potassium: 3.7 mmol/L (ref 3.5–5.1)
Sodium: 135 mmol/L (ref 135–145)

## 2021-11-21 LAB — GLUCOSE, CAPILLARY
Glucose-Capillary: 88 mg/dL (ref 70–99)
Glucose-Capillary: 122 mg/dL — ABNORMAL HIGH (ref 70–99)
Glucose-Capillary: 94 mg/dL (ref 70–99)
Glucose-Capillary: 111 mg/dL — ABNORMAL HIGH (ref 70–99)

## 2021-11-21 MED ORDER — METOPROLOL TARTRATE 12.5 MG HALF TABLET
12.5000 mg | ORAL_TABLET | Freq: Two times a day (BID) | ORAL | Status: DC
  Administered 2021-11-21 – 2021-12-05 (×20): 12.5 mg via ORAL
  Filled 2021-11-21 (×25): qty 1

## 2021-11-21 NOTE — Progress Notes (Signed)
PROGRESS NOTE    Courtney Gray  WLN:[id number] DOB: 10-22-1970 DOA: 11/01/2021 PCP: Monico Blitz, MD    Chief Complaint  Patient presents with   Vascular Access Problem    Brief Narrative: 51 y.o. female with medical history significant of ESRD on HD TTS, recent bacteremia and hepatic abscess on long-term IV antibiotics, chronic HF PEF, PVD status post bilateral AKA, IIDM, morbid obesity, chronic sacral wound stage II, presented with increasing shortness of breath.   Patient underwent bilateral AKA recently including right AKA in May and left AKA on 7/5.  And patient was recently diagnosed with septic shock secondary to bacteremia and liver abscess, for which patient has been on cefepime and Flagyl and the last dose will be August 4.  Recently, since patient has bilateral AKA, she has had extreme difficulty to get access to outpatient HD center.  She never missed her dialysis however according to outpatient HD records, it takes at least 4 staff members and [facility] lift for patient to be seated in the dialysis chair.  Starting today, patient started to feel shortness of breath, no chest pain no swelling.  No cough no fever or chills. She is now awaiting placement for HD that takes stretcher and is hoping to find one locally.   Assessment & Plan:   Active Problems:   End stage renal disease ([place])   Hypertension   Decubitus ulcer of sacral region, stage 2 (HCC)   Type 2 diabetes mellitus with hyperlipidemia (HCC)   Bacteremia   PVD (peripheral vascular disease) (HCC)   Class 3 obesity (HCC)  #1 acute on chronic diastolic CHF with chronic hypoxemic respiratory failure -Secondary to uncontrolled hypertension, patient with signs of mild volume overload on presentation. -Imdur held due to soft/low blood pressure.   -Decrease Lopressor to 12.5 twice daily.   -Volume management per HD/nephrology.  2.  Hypertensive emergency -Was on Imdur, metoprolol. -Due to soft blood pressure during HD  Imdur held. -Continue Lopressor, however patient noted to be refusing. -Decrease Lopressor to 12.5 twice daily.  3.  ESRD on HD -On admission admitting physician discussed with ED physician and case manager that patient will likely need long-term dialysis plan as patient currently in outpatient HD center had hard time accommodating patient's dialysis. -Nephrology consulted and managing hemodialysis. -TOC following and exploring options. -It is noted that patient recently moved from [place] to be with her daughter in [place] and insistent on finding a place in [place]. -It is noted that patient's daughter does not have a car even to drive patient if she is placed far away. -TOC following and helping with placement.  4.  Anemia of CKD -IV iron weekly per nephrology. -H&H stable.  5.  Constipation -Continue suppository as noted MiraLAX does not work well for patient. -Patient noted to have refused suppository and enema. -Continue bowel regimen of Colace twice daily, MiraLAX twice daily which patient seems to be refusing as well as Senokot-S daily which patient seems to be refusing.  6.  Nausea/vomiting -Resolved.  7.  Well-controlled insulin-dependent diabetes mellitus -Hemoglobin A1c 5.7 -CBG 88 this morning. -SSI.  8.  Morbid obesity -Lifestyle modification -Outpatient follow-up.  9.  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 -Patient receiving IV cefepime  on HD and Flagyl which was completed 8/4. -Dr. Algis Liming  consulted IR to evaluate for abdominal drain that was in place IR seen and drain removed 8/9. -Patient noted to have missed ID clinic appointment on  11/01/2021. -Patient seen by ID no indication for further antibiotics at this time, IR to remove drain per ID, ID has since signed off. -Abdominal discomfort improved post drain removal.  10.  PAD -Status post bilateral AKA for gangrene with plan to follow-up with vascular surgery in a month (Dr. Debe Coder on 11/21/2021 for staple removal) -Patient is status post left middle finger amputation for gangrene during recent admission.  11.  Hyperlipidemia -Statin.  12.  Chronic respiratory failure with hypoxia -Patient noted to report uses 4 L/Min home O2 prior to admission. -Currently sats of 97% on 3 L nasal cannula.  13. Stage II sacral wound -Wound care consulted. -Per PT patient with increased drainage from wound and as such we will have wound care reassess. Pressure Injury 09/11/21 Buttocks Right Stage 2 -  Partial thickness loss of dermis presenting as a shallow open injury with a red, pink wound bed without slough. pink (Active)  09/11/21 0000  Location: Buttocks  Location Orientation: Right  Staging: Stage 2 -  Partial thickness loss of dermis presenting as a shallow open injury with a red, pink wound bed without slough.  Wound Description (Comments): pink  Present on Admission: Yes     Pressure Injury 11/12/21 Hip Right Unstageable - Full thickness tissue loss in which the base of the injury is covered by slough (yellow, tan, gray, green or brown) and/or eschar (tan, brown or black) in the wound bed. 90% covered with slough (Active)  11/12/21 0800  Location: Hip  Location Orientation: Right  Staging: Unstageable - Full thickness tissue loss in which the base of the injury is covered by slough (yellow, tan, gray, green or brown) and/or eschar (tan, brown or black) in the wound bed.  Wound Description (Comments): 90% covered with slough  Present on Admission: Yes (per RN)       DVT prophylaxis: Heparin Code Status: Full Family Communication: Updated patient.  No family at  bedside. Disposition: TBD  Status is: Inpatient Remains inpatient appropriate because: Severely of illness   Consultants:  Nephrology: Dr. Moshe Cipro 11/01/2021 Wound care RN, Cecille Rubin McNichol 11/03/2021 ID: Dr.[email or web address] 11/13/2021  Procedures:  CT abdomen and pelvis 11/13/2021 Chest x-ray 11/01/2021  Antimicrobials:  Anti-infectives (From admission, onward)    Start     Dose/Rate Route Frequency Ordered Stop   11/04/21 1800  ceFEPIme (MAXIPIME) 2 g in sodium chloride 0.9 % 100 mL IVPB        2 g 200 mL/hr over 30 Minutes Intravenous Every M-W-F (1800) 11/03/21 3762 11/08/21 2359   11/04/21 1200  ceFEPIme (MAXIPIME) 2 g in sodium chloride 0.9 % 100 mL IVPB  Status:  Discontinued        2 g 200 mL/hr over 30 Minutes Intravenous Every M-W-F (Hemodialysis) 11/02/21 0941 11/03/21 0952   11/02/21 1800  ceFEPIme (MAXIPIME) 2 g in sodium chloride 0.9 % 100 mL IVPB        2 g 200 mL/hr over 30 Minutes Intravenous  Once 11/02/21 0941 11/02/21 1812   11/02/21 1200  ceFEPIme (MAXIPIME) 2 g in sodium chloride 0.9 % 100 mL IVPB  Status:  Discontinued        2 g 200 mL/hr over 30 Minutes Intravenous Every T-Th-Sa (Hemodialysis) 11/01/21 1934 11/02/21 0941   11/01/21 2200  metroNIDAZOLE (FLAGYL) tablet 500 mg        500 mg Oral Every 12 hours 11/01/21 1827 11/08/21 2225   11/01/21 1945  ceFEPIme (MAXIPIME) 1 g in sodium chloride 0.9 % 100 mL IVPB        1 g 200 mL/hr over 30 Minutes Intravenous  Once 11/01/21 1933 11/01/21 2032   11/01/21 1730  ceFEPIme (MAXIPIME) 1 g in sodium chloride 0.9 % 100 mL IVPB  Status:  Discontinued        1 g 200 mL/hr over 30 Minutes Intravenous Every 24 hours 11/01/21 1720 11/01/21 1933         Subjective: Patient laying in bed.  Getting ready to be placed in wheelchair via [place].  Denies any chest pain.  No shortness of breath.  No abdominal pain.  Tolerating current diet.   Objective: Vitals:   11/20/21 2052 11/21/21 0519 11/21/21 0919 11/21/21 0956   BP: (!) 108/41 (!) 130/52 (!) 116/34 (!) 117/40  Pulse: 80 73 72   Resp: 18 18 17    Temp: 98.7 F (37.1 C) 98.9 F (37.2 C) 98.3 F (36.8 C)   TempSrc: Oral Oral    SpO2: 98% 100% 97%   Weight:      Height:        Intake/Output Summary (Last 24 hours) at 11/21/2021 1235 Last data filed at 11/21/2021 0900 Gross per 24 hour  Intake 760 ml  Output 0 ml  Net 760 ml    Filed Weights   11/20/21 0000 11/20/21 0847 11/20/21 1424  Weight: 125.6 kg 125.2 kg 122.5 kg    Examination:  General exam: NAD. Respiratory system: CTA B.  No wheezes, no crackles, no rhonchi.  Fair air movement.  Speaking in full sentences.  Cardiovascular system: Regular rate rhythm no murmurs rubs or gallops.  No JVD.   Gastrointestinal system: Abdomen is nondistended, soft and nontender. No organomegaly or masses felt. Normal bowel sounds heard. Central nervous system: Alert and oriented. No focal neurological deficits. Extremities: Status post B AKA Skin: Large lower decubitus ulcers with good granulation.  Psychiatry: Judgement and insight appear normal. Mood & affect appropriate.     Data Reviewed: I have personally reviewed following labs and imaging studies  CBC: Recent Labs  Lab 11/15/21 0750 11/18/21 1412 11/20/21 0906 11/21/21 0544  WBC 10.8* 13.2* 11.9* 11.4*  NEUTROABS  --   --   --  7.6  HGB 8.3* 8.4* 7.8* 8.5*  HCT 27.3* 27.7* 25.8* 27.3*  MCV 96.5 96.2 97.7 96.1  PLT 175 208 211 209     Basic Metabolic Panel: Recent Labs  Lab 11/15/21 0751 11/18/21 1430 11/20/21 0906 11/21/21 0544  NA 131* 135 135 135  K 3.3* 4.0 3.9 3.7  CL 96* 99 98 97*  CO2 26 26 27 28   GLUCOSE 132* 128* 126* 96  BUN 25* 42* 38* 27*  CREATININE 5.75* 7.96* 6.87* 4.84*  CALCIUM 8.4* 8.7* 8.5* 8.7*  PHOS 4.9* 5.8* 6.3* 5.0*     GFR: Estimated Creatinine Clearance: 18.1 mL/min (A) (by C-G formula based on SCr of 4.84 mg/dL (H)).  Liver Function Tests: Recent Labs  Lab 11/15/21 0751  11/18/21 1430 11/20/21 0906 11/21/21 0544  ALBUMIN 2.2* 2.3* 2.2* 2.2*     CBG: Recent Labs  Lab 11/20/21 0727 11/20/21 1719 11/20/21 2051 11/21/21 0734 11/21/21 1149  GLUCAP 108* 141* 130* 88 122*      No results found for this or any previous visit (from the  past 240 hour(s)).       Radiology Studies: No results found.      Scheduled Meds:  atorvastatin  10 mg Oral Daily   darbepoetin (ARANESP) injection - DIALYSIS  100 mcg Intravenous Q Fri-HD   docusate sodium  100 mg Oral BID   feeding supplement  1 Container Oral TID BM   gabapentin  100 mg Oral BID   heparin  5,000 Units Subcutaneous Q12H   insulin aspart  0-5 Units Subcutaneous QHS   insulin aspart  0-6 Units Subcutaneous TID WC   leptospermum manuka honey  1 Application Topical Daily   levothyroxine  100 mcg Oral Q0600   metoprolol tartrate  25 mg Oral BID   mouth rinse  15 mL Mouth Rinse 4 times per day   pantoprazole  40 mg Oral Daily   polyethylene glycol  17 g Oral BID   senna  1 tablet Oral Daily   Continuous Infusions:  ferric gluconate (FERRLECIT) IVPB 125 mg (11/18/21 1422)     LOS: 20 days    Time spent: 35 minutes    Irine Seal, MD [facility]   To contact the attending provider between 7A-7P or the covering provider during after hours 7P-7A, please log into the web site [email or web address] and access using universal [place] password for that web site. If you do not have the password, please call the hospital operator.  11/21/2021, 12:35 PM

## 2021-11-21 NOTE — Progress Notes (Signed)
[facility] Progress Note   Subjective:   Seen in room. Comfortable. No complaints this am. Completed dialysis yesterday net UF 3L   Objective Vitals:   11/20/21 2052 11/21/21 0519 11/21/21 0919 11/21/21 0956  BP: (!) 108/41 (!) 130/52 (!) 116/34 (!) 117/40  Pulse: 80 73 72   Resp: 18 18 17    Temp: 98.7 F (37.1 C) 98.9 F (37.2 C) 98.3 F (36.8 C)   TempSrc: Oral Oral    SpO2: 98% 100% 97%   Weight:      Height:       Physical Exam General: Alert, obese female in NAD Heart: RRR, no murmurs, rubs or gallops Lungs: CTA bilaterally without wheezing, rhonchi or rales Abdomen: Soft, non-distended, +BS Extremities: trace stump edema bilaterally Dialysis Access: [facility] with intact dressing  Additional Objective Labs: Basic Metabolic Panel: Recent Labs  Lab 11/18/21 1430 11/20/21 0906 11/21/21 0544  NA 135 135 135  K 4.0 3.9 3.7  CL 99 98 97*  CO2 26 27 28   GLUCOSE 128* 126* 96  BUN 42* 38* 27*  CREATININE 7.96* 6.87* 4.84*  CALCIUM 8.7* 8.5* 8.7*  PHOS 5.8* 6.3* 5.0*    Liver Function Tests: Recent Labs  Lab 11/18/21 1430 11/20/21 0906 11/21/21 0544  ALBUMIN 2.3* 2.2* 2.2*    No results for input(s): "LIPASE", "AMYLASE" in the last 168 hours. CBC: Recent Labs  Lab 11/15/21 0750 11/18/21 1412 11/20/21 0906 11/21/21 0544  WBC 10.8* 13.2* 11.9* 11.4*  NEUTROABS  --   --   --  7.6  HGB 8.3* 8.4* 7.8* 8.5*  HCT 27.3* 27.7* 25.8* 27.3*  MCV 96.5 96.2 97.7 96.1  PLT 175 208 211 209    Blood Culture    Component Value Date/Time   SDES BLOOD LEFT ANTECUBITAL 10/16/2021 1534   SPECREQUEST  10/16/2021 1534    BOTTLES DRAWN AEROBIC AND ANAEROBIC Blood Culture results may not be optimal due to an inadequate volume of blood received in culture bottles   CULT  10/16/2021 1534    NO GROWTH 5 DAYS Performed at [facility] [address], [place], [place] [address]    REPTSTATUS 10/21/2021 FINAL 10/16/2021 1534    Cardiac Enzymes: No  results for input(s): "CKTOTAL", "CKMB", "CKMBINDEX", "TROPONINI" in the last 168 hours. CBG: Recent Labs  Lab 11/19/21 2147 11/20/21 0727 11/20/21 1719 11/20/21 2051 11/21/21 0734  GLUCAP 140* 108* 141* 130* 88    Iron Studies: No results for input(s): "IRON", "TIBC", "TRANSFERRIN", "FERRITIN" in the last 72 hours. @lablastinr3 @ Studies/Results: No results found. Medications:  ferric gluconate (FERRLECIT) IVPB 125 mg (11/18/21 1422)    atorvastatin  10 mg Oral Daily   darbepoetin (ARANESP) injection - DIALYSIS  100 mcg Intravenous Q Fri-HD   docusate sodium  100 mg Oral BID   feeding supplement  1 Container Oral TID BM   gabapentin  100 mg Oral BID   heparin  5,000 Units Subcutaneous Q12H   insulin aspart  0-5 Units Subcutaneous QHS   insulin aspart  0-6 Units Subcutaneous TID WC   leptospermum manuka honey  1 Application Topical Daily   levothyroxine  100 mcg Oral Q0600   metoprolol tartrate  25 mg Oral BID   mouth rinse  15 mL Mouth Rinse 4 times per day   pantoprazole  40 mg Oral Daily   polyethylene glycol  17 g Oral BID   senna  1 tablet Oral Daily    Dialysis Orders: MWF - [facility] ---?transferring to [facility] 4hrs, BFR 400, DFR 600,  AVF, EDW 146kg, 3K/ 2Ca - Heparin 2500 units with HD - Hectorol 44mcg IV qHD, held as of 10/30/21  Assessment/Plan: ESRD - on HD MWF. HD 8/18 2. Hypertension/volume - BP variable.  UF as able with HD.  Metoprolol 25mg  BID started 11/04/21.  Wide variation in recorded bed weights. Continue 3-4L goals as tolerated. 3. Hypokalemia: Requiring intermittent supplementation.  Using 3K bath with HD but 4K is not available. supplement PRN. K stable today 3.7 4. Anemia of CKD - Hgb 8s. transferrin too low to calculate tsat. On IV iron weekly for now since she is off IV abx. Aranesp q Friday.  5. Secondary Hyperparathyroidism - Hectorol on hold, getting Tums 1000mg  TID as binder -> reduced dose to 500mg  TID. Ca/Phos ok.  6. Nutrition -  Renal diet, have added supplements. Alb very low. 7. Dispo - Very difficulty situation. Patient required 4 staff members and 2 hoyer pads for transfer into HD chair as OP. Not staying on HD for full treatment d/t pain for multiple decubitus ulcers. This is a major safety concern for staff and patient in the outpatient center. Also concern for inability for ulcers to heal properly d/t frequent and rigorous transfers that are required. Need to determine new placement at a facility where she can receive HD in the bed-more likley a LTAC if possible.   8. Recent septic shock 2/2 bacteremia/liver abscess - Now s/p course of IV cefepime as of 11/08/21.  [name] PA-C [facility] 11/21/2021,10:17 AM

## 2021-11-21 NOTE — Progress Notes (Signed)
Physical Therapy Treatment Patient Details Name: Courtney Gray MRN: [id number] DOB: [birth year] Today's Date: 11/21/2021   History of Present Illness Pt adm 7/28 with acute pulmonary edema. 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    PT Comments    Pt eager to go to mat table in the inpatient rehab gym to work on lateral scoot transfers. In process of turning to place lift pad dressing noted to have come off R ischial pressure injury and copious amounts of purulent drainage noted. Wound assessed, medihoney applied to nonviable tissue and foam pad replaced. Pt rolls with modA on air mattress for placement of lift pad. Pt lifted to wheelchair and wheeled to inpatient rehab gym where she was lifted to mat table and worked on bilateral hip ROM and stretching. Deferred scooting transfers due to wound. Pt lifted back to wheelchair and able to propel approx 20 feet before increased fatigue from session. Wheeled back to room and lifted back to bed. Pt able to tolerate 90 minute session and is extremely motivated to regain her independence. PT changing discharge recommendations to AIR as PT feels with proper wound care and short bout of therapy at AIR pt would be able to return home and resume mobility with wheelchair which was her PLOF. PT will continue to follow acutely.    Recommendations for follow up therapy are one component of a multi-disciplinary discharge planning process, led by the attending physician.  Recommendations may be updated based on patient status, additional functional criteria and insurance authorization.  Follow Up Recommendations  Acute inpatient rehab (3hours/day) Can patient physically be transported by private vehicle: No   Assistance Recommended at Discharge Frequent or constant Supervision/Assistance  Patient can return home with the following Two people to help with walking and/or  transfers;Two people to help with bathing/dressing/bathroom;Assist for transportation;Assistance with cooking/housework;Help with stairs or ramp for entrance   Equipment Recommendations  Wheelchair (measurements PT);Wheelchair cushion (measurements PT);Other (comment);Hospital bed (Needs wide w/c with cushion and anti-tippers with ?offset wheels to reduce risk of tipping backwards)    Recommendations for Other Services Rehab consult     Precautions / Restrictions Precautions Precautions: Fall Precaution Comments: bil AKA, 3L O2 at baseline, LUQ drain Restrictions Weight Bearing Restrictions: Yes RLE Weight Bearing: Non weight bearing LLE Weight Bearing: Non weight bearing Other Position/Activity Restrictions: minimized pushing/pulling and weight through L hand, dressing now removed     Mobility  Bed Mobility Overal bed mobility: Needs Assistance Bed Mobility: Supine to Sit Rolling: Min assist   Supine to sit: Min assist     General bed mobility comments: min A to initiate roll and modA to come all the way onto her hip for maxisky pad placement on unstable air mattress, min A for rolling on firm mat table surface,min A for pulling to longsitting in bed with use of bedrails    Transfers Overall transfer level: Needs assistance Equipment used:  (maxisky) Transfers: Bed to chair/wheelchair/BSC             General transfer comment: lifted to and from wheelchair with [facility] and amputee pad from bed and then to from mat table and back to bed Transfer via Lift Equipment: Maxisky (with amputee lift pad)  Ambulation/Gait               General Gait Details: unable    Information systems manager mobility: Yes Wheelchair propulsion: Both  upper extremities Wheelchair parts: Needs assistance Distance: 20 Wheelchair Assistance Details (indicate cue type and reason): min assist for keeping w/c on straight path as pt provided propulsion; pt able to  lock and unlock brakes with cues      Balance Overall balance assessment: Needs assistance Sitting-balance support: No upper extremity supported Sitting balance-[name] Scale: Good Sitting balance - Comments: with long sitting in bed                                    Cognition Arousal/Alertness: Awake/alert Behavior During Therapy: WFL for tasks assessed/performed Overall Cognitive Status: Within Functional Limits for tasks assessed                                          Exercises Amputee Exercises Hip Extension: Sidelying, AAROM, Both, 10 reps Hip ABduction/ADduction: Sidelying, AAROM, Both, 10 reps Other Exercises Other Exercises: bilateral hip flexion in supine on mat table x20    General Comments General comments (skin integrity, edema, etc.): Dressing on R ischial pressure injury had come off due to copious amounts of purulent drainage. Soften Eschar and yellow stringy slough noted to be in the center of wound, surrounded by beefy red granulation tissue, tunnel also noted. replaced dressing and asked for reconsult from [facility] RN for new dressing orders given the evolution of the wound      Pertinent Vitals/Pain Pain Assessment Pain Assessment: Faces Faces Pain Scale: Hurts even more Pain Location: LLE and R ischial pressure wound Pain Descriptors / Indicators: Guarding, Sore Pain Intervention(s): Limited activity within patient's tolerance, Monitored during session, Repositioned, Patient requesting pain meds-RN notified     PT Goals (current goals can now be found in the care plan section) Acute Rehab PT Goals Patient Stated Goal: Pt wants to get up into a w/c PT Goal Formulation: With patient Time For Goal Achievement: 12/03/21 Potential to Achieve Goals: Fair Progress towards PT goals: Progressing toward goals    Frequency    Min 2X/week      PT Plan Discharge plan needs to be updated       AM-PAC PT "6 Clicks" Mobility    Outcome Measure  Help needed turning from your back to your side while in a flat bed without using bedrails?: Total Help needed moving from lying on your back to sitting on the side of a flat bed without using bedrails?: Total Help needed moving to and from a bed to a chair (including a wheelchair)?: Total Help needed standing up from a chair using your arms (e.g., wheelchair or bedside chair)?: Total Help needed to walk in hospital room?: Total Help needed climbing 3-5 steps with a railing? : Total 6 Click Score: 6    End of Session Equipment Utilized During Treatment: Oxygen Activity Tolerance: Patient tolerated treatment well Patient left: with call bell/phone within reach;in bed Nurse Communication: Mobility status PT Visit Diagnosis: Other abnormalities of gait and mobility (R26.89);Muscle weakness (generalized) (M62.81) Pain - Right/Left: Right Pain - part of body:  (ischial wound)     Time: 7939-0300 PT Time Calculation (min) (ACUTE ONLY): 93 min  Charges:  $Therapeutic Exercise: 23-37 mins $Therapeutic Activity: 38-52 mins $Wheel Chair Management: 8-22 mins                     Benjamine Mola  Wende Mott PT, DPT Acute Rehabilitation Services Please use secure chat or  Call Office [phone]    [place] 11/21/2021, 2:24 PM

## 2021-11-21 NOTE — Progress Notes (Signed)
Mobility Specialist Criteria Algorithm Info.   11/20/21 1615  Pain Assessment  Pain Score 10  Breathing 1  Negative Vocalization 2  Facial Expression 2  Pain Location R bottom wound  Pain Descriptors / Indicators Guarding;Moaning;Crying  Pain Intervention(s) RN gave pain meds during session  Mobility  Activity Transferred from chair to bed  Level of Assistance +2 (takes two people)  Civil engineer, contracting / Overhead Lift  RLE Weight Bearing NWB  LLE Weight Bearing NWB  Activity Response Tolerated poorly   Received urgent request from RN to get patient in bed. Assisted OT 45 minutes prior to [facility] via maxisky and was c/o pain in R-bottom wound. Despite pain, patient decided to remain in [facility] to eat dinner. Patient did not tolerate sitting in WC, was crying on entry and breathing heavily. Transferred pt B2B via maxisky. Tolerated well and had some relief after returning to bed. Was left wit hall needs met, call bell in reach.    Courtney Gray, [place], [place]  YKLRV:[phone] Office: [phone]

## 2021-11-21 NOTE — Progress Notes (Signed)
Contacted Courtney Gray this morning to request update on pt's referral for stretcher HD in [place]. Maudie Mercury has not had a response and plans to submit pt's referral to another clinic for review. Renold Don to please provide update to navigator with any new information.   Melven Sartorius Renal Navigator [phone]

## 2021-11-22 DIAGNOSIS — R7881 Bacteremia: Secondary | ICD-10-CM | POA: Diagnosis not present

## 2021-11-22 DIAGNOSIS — N186 End stage renal disease: Secondary | ICD-10-CM | POA: Diagnosis not present

## 2021-11-22 DIAGNOSIS — L89152 Pressure ulcer of sacral region, stage 2: Secondary | ICD-10-CM | POA: Diagnosis not present

## 2021-11-22 LAB — RENAL FUNCTION PANEL
Albumin: 2.3 g/dL — ABNORMAL LOW (ref 3.5–5.0)
Anion gap: 11 (ref 5–15)
BUN: 38 mg/dL — ABNORMAL HIGH (ref 6–20)
CO2: 24 mmol/L (ref 22–32)
Calcium: 8.5 mg/dL — ABNORMAL LOW (ref 8.9–10.3)
Chloride: 96 mmol/L — ABNORMAL LOW (ref 98–111)
Creatinine, Ser: 6.56 mg/dL — ABNORMAL HIGH (ref 0.44–1.00)
GFR, Estimated: 7 mL/min — ABNORMAL LOW (ref >60)
Glucose, Bld: 179 mg/dL — ABNORMAL HIGH (ref 70–99)
Phosphorus: 6.4 mg/dL — ABNORMAL HIGH (ref 2.5–4.6)
Potassium: 4 mmol/L (ref 3.5–5.1)
Sodium: 131 mmol/L — ABNORMAL LOW (ref 135–145)

## 2021-11-22 LAB — GLUCOSE, CAPILLARY
Glucose-Capillary: 123 mg/dL — ABNORMAL HIGH (ref 70–99)
Glucose-Capillary: 199 mg/dL — ABNORMAL HIGH (ref 70–99)
Glucose-Capillary: 84 mg/dL (ref 70–99)

## 2021-11-22 LAB — CBC
HCT: 28.2 % — ABNORMAL LOW (ref 36.0–46.0)
Hemoglobin: 8.6 g/dL — ABNORMAL LOW (ref 12.0–15.0)
MCH: 29.6 pg (ref 26.0–34.0)
MCHC: 30.5 g/dL (ref 30.0–36.0)
MCV: 96.9 fL (ref 80.0–100.0)
Platelets: 229 10*3/uL (ref 150–400)
RBC: 2.91 MIL/uL — ABNORMAL LOW (ref 3.87–5.11)
RDW: 16.7 % — ABNORMAL HIGH (ref 11.5–15.5)
WBC: 10.7 10*3/uL — ABNORMAL HIGH (ref 4.0–10.5)
nRBC: 0 % (ref 0.0–0.2)

## 2021-11-22 MED ORDER — ALTEPLASE 2 MG IJ SOLR
INTRAMUSCULAR | Status: AC
  Administered 2021-11-22: 4 mg
  Filled 2021-11-22: qty 8

## 2021-11-22 MED ORDER — ALTEPLASE 2 MG IJ SOLR
4.0000 mg | Freq: Once | INTRAMUSCULAR | Status: AC
  Filled 2021-11-22: qty 4

## 2021-11-22 MED ORDER — HEPARIN SODIUM (PORCINE) 1000 UNIT/ML IJ SOLN
INTRAMUSCULAR | Status: AC
  Administered 2021-11-22: 2000 [IU] via INTRAVENOUS
  Filled 2021-11-22: qty 2

## 2021-11-22 MED ORDER — HEPARIN SODIUM (PORCINE) 1000 UNIT/ML IJ SOLN: 2000.0000 [IU] | Freq: Once | INTRAMUSCULAR | Status: AC

## 2021-11-22 NOTE — Procedures (Signed)
Patient seen and examined on Hemodialysis. BP (!) 132/115   Pulse 78   Temp 98.1 F (36.7 C) (Oral)   Resp 13   Ht 5\' 5"  (1.651 m)   Wt 122.5 kg   SpO2 100%   BMI 44.93 kg/m   QB 350 mL/ min via TDC, UF goal 4L.  Having catheter run issues.  Were able to stabilize with a saline bag on it for now.  Sluggish push/ pull.  Will get through as much rx as possible tonight, lock catheter with alteplase, and will bring back tomorrow AM for sequential rx.  Discussed with pt who is agreeable to the plan.  Tolerating treatment without complaints at this time.   Madelon Lips MD [facility] [phone] 6:57 PM

## 2021-11-22 NOTE — Progress Notes (Signed)
PROGRESS NOTE    Courtney Gray  FIE:[id number] DOB: [birth year] DOA: 11/01/2021 PCP: Monico Blitz, MD    Chief Complaint  Patient presents with   Vascular Access Problem    Brief Narrative: 51 y.o. female with medical history significant of ESRD on HD TTS, recent bacteremia and hepatic abscess on long-term IV antibiotics, chronic HF PEF, PVD status post bilateral AKA, IIDM, morbid obesity, chronic sacral wound stage II, presented with increasing shortness of breath.   Patient underwent bilateral AKA recently including right AKA in May and left AKA on 7/5.  And patient was recently diagnosed with septic shock secondary to bacteremia and liver abscess, for which patient has been on cefepime and Flagyl and the last dose will be August 4.  Recently, since patient has bilateral AKA, she has had extreme difficulty to get access to outpatient HD center.  She never missed her dialysis however according to outpatient HD records, it takes at least 4 staff members and [facility] lift for patient to be seated in the dialysis chair.  Starting today, patient started to feel shortness of breath, no chest pain no swelling.  No cough no fever or chills. She is now awaiting placement for HD that takes stretcher and is hoping to find one locally.   Assessment & Plan:   Active Problems:   End stage renal disease ([place])   Hypertension   Decubitus ulcer of sacral region, stage 2 (HCC)   Type 2 diabetes mellitus with hyperlipidemia (HCC)   Bacteremia   PVD (peripheral vascular disease) (HCC)   Class 3 obesity (HCC)  #1 acute on chronic diastolic CHF with chronic hypoxemic respiratory failure -Secondary to uncontrolled hypertension, patient with signs of mild volume overload on presentation. -Imdur held due to soft/low blood pressure.   -Decreased Lopressor to 12.5 twice daily.   -Volume management per HD/nephrology.  2.  Hypertensive emergency -Was on Imdur, metoprolol. -Due to soft blood pressure during HD  Imdur held. -Continue Lopressor 12.5 mg twice daily.  3.  ESRD on HD -On admission admitting physician discussed with ED physician and case manager that patient will likely need long-term dialysis plan as patient currently in outpatient HD center had hard time accommodating patient's dialysis. -Nephrology consulted and managing hemodialysis. -TOC following and exploring options. -It is noted that patient recently moved from [place] to be with her daughter in [place] and insistent on finding a place in [place]. -It is noted that patient's daughter does not have a car even to drive patient if she is placed far away. -TOC following and helping with placement.  4.  Anemia of CKD -IV iron weekly per nephrology. -H&H stable.  5.  Constipation -Continue suppository as noted MiraLAX does not work well for patient. -Patient noted to have refused suppository and enema. -Continue bowel regimen of Colace twice daily, MiraLAX twice daily which patient seems to be refusing as well as Senokot-S daily which patient seems to be refusing.  6.  Nausea/vomiting -Resolved. -Tolerating current diet.  7.  Well-controlled insulin-dependent diabetes mellitus -Hemoglobin A1c 5.7 -CBG 123 this morning. -SSI.  8.  Morbid obesity -Lifestyle modification -Outpatient follow-up.  9.  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 -Patient receiving IV cefepime on HD and Flagyl which  was completed 8/4. -Dr. Algis Liming  consulted IR to evaluate for abdominal drain that was in place IR seen and drain removed 8/9. -Patient noted to have missed ID clinic appointment on 11/01/2021. -Patient seen by ID no  indication for further antibiotics at this time, IR to remove drain per ID, ID has since signed off. -Abdominal discomfort improved post drain removal.  10.  PAD -Status post bilateral AKA for gangrene with plan to follow-up with vascular surgery in a month (Dr. Stanford Breed on 11/21/2021 for staple removal) -Patient is status post left middle finger amputation for gangrene during recent admission. -Vascular surgery informed of patient's admission.  11.  Hyperlipidemia -Statin.  12.  Chronic respiratory failure with hypoxia -Patient noted to report uses 4 L/Min home O2 prior to admission. -Currently sats of 98% on 2 L nasal cannula.  13. Stage II right ischial wound, POA -Wound care consulted. -Per PT patient with increased drainage from wound and as such WOC reconsulted who has assessed patient and PT seen patient for hydrotherapy. -Continue current wound care as recommended by [facility] RN. Pressure Injury 09/11/21 Buttocks Right Stage 2 -  Partial thickness loss of dermis presenting as a shallow open injury with a red, pink wound bed without slough. pink; evolved to [place], end dated this wound as to not have dupliate (Active)  09/11/21 0000  Location: Buttocks  Location Orientation: Right  Staging: Stage 2 -  Partial thickness loss of dermis presenting as a shallow open injury with a red, pink wound bed without slough.  Wound Description (Comments): pink; evolved to [place], end dated this wound as to not have dupliate  Present on Admission: Yes     Pressure Injury 11/12/21 Hip Right Unstageable - Full thickness tissue loss in which the base of the injury is covered by slough (yellow, tan, gray, green or brown) and/or eschar (tan, brown or black) in the wound bed. 90% covered with slough (Active)  11/12/21 0800  Location: Hip  Location Orientation: Right  Staging: Unstageable - Full thickness tissue loss in which the base of the injury is covered by slough (yellow, tan, gray, green or brown) and/or  eschar (tan, brown or black) in the wound bed.  Wound Description (Comments): 90% covered with slough  Present on Admission: Yes (per RN)       DVT prophylaxis: Heparin Code Status: Full Family Communication: Updated patient.  No family at bedside. Disposition: TBD  Status is: Inpatient Remains inpatient appropriate because: Severely of illness   Consultants:  Nephrology: Dr. Moshe Cipro 11/01/2021 Wound care RN, Cecille Rubin McNichol 11/03/2021 ID: Dr.[email or web address] 11/13/2021  Procedures:  CT abdomen and pelvis 11/13/2021 Chest x-ray 11/01/2021  Antimicrobials:  Anti-infectives (From admission, onward)    Start     Dose/Rate Route Frequency Ordered Stop   11/04/21 1800  ceFEPIme (MAXIPIME) 2 g in sodium chloride 0.9 % 100 mL IVPB        2 g 200 mL/hr over 30 Minutes Intravenous Every M-W-F (1800) 11/03/21 9518 11/08/21 2359   11/04/21 1200  ceFEPIme (MAXIPIME) 2 g in sodium chloride 0.9 % 100 mL IVPB  Status:  Discontinued        2 g 200 mL/hr over 30 Minutes Intravenous Every M-W-F (Hemodialysis) 11/02/21 0941 11/03/21 0952   11/02/21 1800  ceFEPIme (MAXIPIME) 2 g in sodium chloride 0.9 % 100 mL IVPB        2 g 200 mL/hr over 30 Minutes Intravenous  Once 11/02/21 0941 11/02/21 1812   11/02/21 1200  ceFEPIme (MAXIPIME) 2 g in sodium chloride 0.9 % 100 mL IVPB  Status:  Discontinued        2 g 200 mL/hr over 30 Minutes Intravenous Every T-Th-Sa (Hemodialysis) 11/01/21 1934 11/02/21 0941   11/01/21 2200  metroNIDAZOLE (FLAGYL) tablet 500 mg        500 mg Oral Every 12 hours 11/01/21 1827 11/08/21 2225   11/01/21 1945  ceFEPIme (MAXIPIME) 1 g in sodium chloride 0.9 % 100 mL IVPB        1 g 200 mL/hr over 30 Minutes Intravenous  Once 11/01/21 1933 11/01/21 2032   11/01/21 1730  ceFEPIme (MAXIPIME) 1 g in sodium chloride 0.9 % 100 mL IVPB  Status:  Discontinued        1 g 200 mL/hr over 30 Minutes Intravenous Every 24 hours 11/01/21 1720 11/01/21 1933         Subjective: Patient  laying in bed.  Denies any chest pain or shortness of breath.  About to be seen by PT for hydrotherapy.  Tolerating current diet.   Objective: Vitals:   11/21/21 1657 11/21/21 2053 11/22/21 0541 11/22/21 0927  BP: (!) 125/50 (!) 145/60 125/63 (!) 135/50  Pulse: 76 80 72 74  Resp: 19 19 20 20   Temp: 98.5 F (36.9 C) 98.6 F (37 C) 99.1 F (37.3 C) 98.1 F (36.7 C)  TempSrc: Oral Oral Oral Oral  SpO2: 97% 98% 94% 98%  Weight:      Height:        Intake/Output Summary (Last 24 hours) at 11/22/2021 1217 Last data filed at 11/22/2021 0200 Gross per 24 hour  Intake 337 ml  Output --  Net 337 ml    Filed Weights   11/20/21 0000 11/20/21 0847 11/20/21 1424  Weight: 125.6 kg 125.2 kg 122.5 kg    Examination:  General exam: NAD. Respiratory system: Lungs clear to auscultation bilaterally.  No wheezes, no crackles, no rhonchi.  Cardiovascular system: RRR no murmurs rubs or gallops.  No JVD.  No lower extremity edema.    Gastrointestinal system: Abdomen is soft, nontender, nondistended, positive bowel sounds.  No rebound.  No guarding.   Central nervous system: Alert and oriented. No focal neurological deficits. Extremities: Status post B AKA Skin: Large right ischial wound/ulcer with some necrotic/loose nonviable tissue, moderate drainage not purulent.  Psychiatry: Judgement and insight appear normal. Mood & affect appropriate.     Data Reviewed: I have personally reviewed following labs and imaging studies  CBC: Recent Labs  Lab 11/18/21 1412 11/20/21 0906 11/21/21 0544 11/22/21 0514  WBC 13.2* 11.9* 11.4* 10.7*  NEUTROABS  --   --  7.6  --   HGB 8.4* 7.8* 8.5* 8.6*  HCT 27.7* 25.8* 27.3* 28.2*  MCV 96.2 97.7 96.1 96.9  PLT 208 211 209 229     Basic Metabolic Panel: Recent Labs  Lab 11/18/21 1430 11/20/21 0906 11/21/21 0544 11/22/21 0514  NA 135 135 135 131*  K 4.0 3.9 3.7 4.0  CL 99 98 97* 96*  CO2 26 27 28 24   GLUCOSE 128* 126* 96 179*  BUN 42* 38* 27*  38*  CREATININE 7.96* 6.87* 4.84* 6.56*  CALCIUM 8.7* 8.5* 8.7* 8.5*  PHOS 5.8* 6.3* 5.0* 6.4*     GFR: Estimated Creatinine Clearance: 13.3 mL/min (A) (by C-G formula based on SCr of 6.56 mg/dL (H)).  Liver Function Tests: Recent Labs  Lab 11/18/21 1430 11/20/21 0906 11/21/21 0544 11/22/21 0514  ALBUMIN 2.3* 2.2* 2.2* 2.3*  CBG: Recent Labs  Lab 11/21/21 1149 11/21/21 1651 11/21/21 2044 11/22/21 0736 11/22/21 1124  GLUCAP 122* 94 111* 123* 199*      No results found for this or any previous visit (from the past 240 hour(s)).       Radiology Studies: No results found.      Scheduled Meds:  atorvastatin  10 mg Oral Daily   darbepoetin (ARANESP) injection - DIALYSIS  100 mcg Intravenous Q Fri-HD   docusate sodium  100 mg Oral BID   feeding supplement  1 Container Oral TID BM   gabapentin  100 mg Oral BID   heparin  5,000 Units Subcutaneous Q12H   insulin aspart  0-5 Units Subcutaneous QHS   insulin aspart  0-6 Units Subcutaneous TID WC   leptospermum manuka honey  1 Application Topical Daily   levothyroxine  100 mcg Oral Q0600   metoprolol tartrate  12.5 mg Oral BID   mouth rinse  15 mL Mouth Rinse 4 times per day   pantoprazole  40 mg Oral Daily   polyethylene glycol  17 g Oral BID   senna  1 tablet Oral Daily   Continuous Infusions:  ferric gluconate (FERRLECIT) IVPB 125 mg (11/18/21 1422)     LOS: 21 days    Time spent: 35 minutes    Irine Seal, MD [facility]   To contact the attending provider between 7A-7P or the covering provider during after hours 7P-7A, please log into the web site [email or web address] and access using universal [place] password for that web site. If you do not have the password, please call the hospital operator.  11/22/2021, 12:17 PM

## 2021-11-22 NOTE — Progress Notes (Signed)
Courtney Gray Progress Note   Subjective:   Patient seen and examined in room. No acute events. HD today. No complaints.  Objective Vitals:   11/21/21 1657 11/21/21 2053 11/22/21 0541 11/22/21 0927  BP: (!) 125/50 (!) 145/60 125/63 (!) 135/50  Pulse: 76 80 72 74  Resp: 19 19 20 20   Temp: 98.5 F (36.9 C) 98.6 F (37 C) 99.1 F (37.3 C) 98.1 F (36.7 C)  TempSrc: Oral Oral Oral Oral  SpO2: 97% 98% 94% 98%  Weight:      Height:       Physical Exam General: Alert, obese female in NAD Heart: RRR, no murmurs, rubs or gallops Lungs: CTA bilaterally without wheezing, rhonchi or rales Abdomen: Soft, non-distended, +BS Extremities: trace stump edema bilaterally Dialysis Access: [facility] with intact dressing  Additional Objective Labs: Basic Metabolic Panel: Recent Labs  Lab 11/20/21 0906 11/21/21 0544 11/22/21 0514  NA 135 135 131*  K 3.9 3.7 4.0  CL 98 97* 96*  CO2 27 28 24   GLUCOSE 126* 96 179*  BUN 38* 27* 38*  CREATININE 6.87* 4.84* 6.56*  CALCIUM 8.5* 8.7* 8.5*  PHOS 6.3* 5.0* 6.4*   Liver Function Tests: Recent Labs  Lab 11/20/21 0906 11/21/21 0544 11/22/21 0514  ALBUMIN 2.2* 2.2* 2.3*   No results for input(s): "LIPASE", "AMYLASE" in the last 168 hours. CBC: Recent Labs  Lab 11/18/21 1412 11/20/21 0906 11/21/21 0544 11/22/21 0514  WBC 13.2* 11.9* 11.4* 10.7*  NEUTROABS  --   --  7.6  --   HGB 8.4* 7.8* 8.5* 8.6*  HCT 27.7* 25.8* 27.3* 28.2*  MCV 96.2 97.7 96.1 96.9  PLT 208 211 209 229   Blood Culture    Component Value Date/Time   SDES BLOOD LEFT ANTECUBITAL 10/16/2021 1534   SPECREQUEST  10/16/2021 1534    BOTTLES DRAWN AEROBIC AND ANAEROBIC Blood Culture results may not be optimal due to an inadequate volume of blood received in culture bottles   CULT  10/16/2021 1534    NO GROWTH 5 DAYS Performed at [facility] [address] [place], [place] [address]    REPTSTATUS 10/21/2021 FINAL 10/16/2021 1534    Cardiac  Enzymes: No results for input(s): "CKTOTAL", "CKMB", "CKMBINDEX", "TROPONINI" in the last 168 hours. CBG: Recent Labs  Lab 11/21/21 0734 11/21/21 1149 11/21/21 1651 11/21/21 2044 11/22/21 0736  GLUCAP 88 122* 94 111* 123*   Iron Studies: No results for input(s): "IRON", "TIBC", "TRANSFERRIN", "FERRITIN" in the last 72 hours. @lablastinr3 @ Studies/Results: No results found. Medications:  ferric gluconate (FERRLECIT) IVPB 125 mg (11/18/21 1422)    atorvastatin  10 mg Oral Daily   darbepoetin (ARANESP) injection - DIALYSIS  100 mcg Intravenous Q Fri-HD   docusate sodium  100 mg Oral BID   feeding supplement  1 Container Oral TID BM   gabapentin  100 mg Oral BID   heparin  5,000 Units Subcutaneous Q12H   insulin aspart  0-5 Units Subcutaneous QHS   insulin aspart  0-6 Units Subcutaneous TID WC   leptospermum manuka honey  1 Application Topical Daily   levothyroxine  100 mcg Oral Q0600   metoprolol tartrate  12.5 mg Oral BID   mouth rinse  15 mL Mouth Rinse 4 times per day   pantoprazole  40 mg Oral Daily   polyethylene glycol  17 g Oral BID   senna  1 tablet Oral Daily    Dialysis Orders: MWF - [place] ---?transferring to [facility]  4hrs, BFR 400, DFR 600,  AVF, EDW 146kg, 3K/ 2Ca - Heparin 2500 units with HD - Hectorol 41mcg IV qHD, held as of 10/30/21  Assessment/Plan: ESRD - on HD MWF. HD 8/18 2. Hypertension/volume - BP variable.  UF as able with HD.  Metoprolol 25mg  BID started 11/04/21.  Wide variation in recorded bed weights. Continue 3-4L goals as tolerated. 3. Hypokalemia: Requiring intermittent supplementation.  Using 3K bath with HD but 4K is not available. supplement PRN. K stable today 4 4. Anemia of CKD - Hgb 8s. transferrin too low to calculate tsat. On IV iron weekly for now since she is off IV abx. Aranesp q Friday.  5. Secondary Hyperparathyroidism - Hectorol on hold, getting Tums 1000mg  TID as binder -> reduced dose to 500mg  TID. PO4 slightly up  today, was okay, watching for now 6. Nutrition - Renal diet, have added supplements. Alb low. 7. Dispo - Very difficulty situation. Patient required 4 staff members and 2 hoyer pads for transfer into HD chair as OP. Not staying on HD for full treatment d/t pain for multiple decubitus ulcers. This is a major safety concern for staff and patient in the outpatient center. Also concern for inability for ulcers to heal properly d/t frequent and rigorous transfers that are required. Need to determine new placement at a facility where she can receive HD in the bed-more likley a LTAC if possible.   8. Recent septic shock 2/2 bacteremia/liver abscess - Now s/p course of IV cefepime as of 11/08/21.  Gean Quint, MD [facility]

## 2021-11-22 NOTE — Consult Note (Signed)
[place] Nurse Consult Note: Reason for Consult: re-evaluate right ischial wound. PT had concerns about wound after therapy yesterday.  Patient was seen by [facility] nursing team 11/03/21 and 11/11/21 for this wound and MASD Wound type:Unstageable Pressure Injury; right ischium Pressure Injury POA: Yes Measurement: 13cmx 8.5cm x 5cm with depth obscured by the amount of necrotic tissue  Wound bed: 90% necrotic/loose non viable tissue/10% pink along perimeter  Drainage (amount, consistency, odor) moderate, slight odor from necrotic tissue. Not purulent  Periwound: intact  Dressing procedure/placement/frequency: Medihoney for debridement and antimicrobial BID, top with dry dressing because of the amount of drainage, secure with ABD pad, tape Low air loss mattress in place for moisture management and pressure redistribution Difficult to offload bc patient is a bilateral LE amputee with  Chair pressure redistribution pads x 2 in the room for use when up in the chair Add PT for hydrotherapy evaluation/CSWD  Will ask PT to address seating surface/WC?? Needed for HD??  Discussed POC with patient and bedside nurse.  Re consult if needed, will not follow at this time. Thanks  Jesyca Weisenburger [employer], RN,CWOCN, CNS, [place] [phone])

## 2021-11-22 NOTE — Progress Notes (Signed)
Contacted by Mar[facility]. Pt's case is being reviewed by [facility] in [place] for stretcher HD. Pt will need an accepting nephrologist. Case discussed with CSW and will await to see if clinic finds an accepting MD.   Melven Sartorius Renal Navigator [phone]

## 2021-11-22 NOTE — Progress Notes (Signed)
Physical Therapy Wound Treatment Patient Details  Name: Courtney Gray MRN: [id number] Date of Birth: 15-Nov-1970  Today's Date: 11/22/2021 Time: 2330-0762 Time Calculation (min): 57 min  Subjective  Subjective Assessment Subjective: I'm a little scared Patient and Family Stated Goals: get this wound healed Date of Onset:  (Unknown) Prior Treatments: unknown  Pain Score:  0-2/10 with premedication  Wound Assessment  Pressure Injury 11/12/21 Hip Right Unstageable - Full thickness tissue loss in which the base of the injury is covered by slough (yellow, tan, gray, green or brown) and/or eschar (tan, brown or black) in the wound bed. 90% covered with slough (Active)  Wound Image   11/22/21 1406  Dressing Type Foam - Lift dressing to assess site every shift;Honey 11/22/21 1406  Dressing Clean, Dry, Intact 11/22/21 1406  Dressing Change Frequency Daily 11/22/21 1406  State of Healing Eschar 11/22/21 1406  Site / Wound Assessment Brown;Painful;Pink;Yellow 11/22/21 1406  % Wound base Red or Granulating 60% 11/22/21 1406  % Wound base Yellow/Fibrinous Exudate 30% 11/22/21 1406  % Wound base Black/Eschar 10% 11/22/21 1406  % Wound base Other/Granulation Tissue (Comment) 0% 11/22/21 1406  Peri-wound Assessment Erythema (blanchable) 11/22/21 1406  Wound Length (cm) 10 cm 11/22/21 1406  Wound Width (cm) 11 cm 11/22/21 1406  Wound Depth (cm) 5 cm 11/22/21 1406  Wound Surface Area (cm^2) 110 cm^2 11/22/21 1406  Wound Volume (cm^3) 550 cm^3 11/22/21 1406  Margins Unattached edges (unapproximated) 11/22/21 1406  Drainage Amount Moderate 11/22/21 1406  Drainage Description Serous;Sanguineous 11/22/21 1406  Treatment Cleansed;Debridement (Selective);Packing (Dry gauze) 11/22/21 1406      Selective Debridement Selective Debridement - Location: R ischium Selective Debridement - Tools Used: Forceps, Scalpel, Scissors Selective Debridement - Tissue Removed: yellow eschar, necrosed adipose     Wound Assessment and Plan  Wound Therapy - Assess/Plan/Recommendations Wound Therapy - Clinical Statement: pt will benefit well from cleansing and selective debridement to her R ischium.  She may need some form of enzymatic /medihoney debridement in addition. Wound Therapy - Functional Problem List: commpromised mobility Factors Delaying/Impairing Wound Healing: Diabetes Mellitus, Immobility Hydrotherapy Plan: Debridement, Dressing change, Patient/family education Wound Therapy - Frequency:  (Tues/Friday) Wound Therapy - Follow Up Recommendations: dressing changes by RN  Wound Therapy Goals- Improve the function of patient's integumentary system by progressing the wound(s) through the phases of wound healing (inflammation - proliferation - remodeling) by: Wound Therapy Goals - Improve the function of patient's integumentary system by progressing the wound(s) through the phases of wound healing by: Decrease Necrotic Tissue to: 10% Decrease Necrotic Tissue - Progress: Goal set today Increase Granulation Tissue - Progress: Goal set today Improve Drainage Characteristics: Min, Serous Improve Drainage Characteristics - Progress: Goal set today Goals/treatment plan/discharge plan were made with and agreed upon by patient/family: Yes Time For Goal Achievement: 7 days Wound Therapy - Potential for Goals: Good  Goals will be updated until maximal potential achieved or discharge criteria met.  Discharge criteria: when goals achieved, discharge from hospital, MD decision/surgical intervention, no progress towards goals, refusal/missing three consecutive treatments without notification or medical reason.  GP     Charges PT Wound Care Charges $ Wound Debridement each add'l 20 sqcm: 6 $PT Hydrotherapy Visit: 1 Visit       Courtney Gray Courtney Gray 11/22/2021, 2:29 PM 11/22/2021  Courtney Carne., PT Acute Rehabilitation Services [phone]  (pager) [phone]  (office)

## 2021-11-22 NOTE — Plan of Care (Signed)
  Problem: Skin Integrity: Goal: Risk for impaired skin integrity will decrease Outcome: Progressing   Problem: Education: Goal: Knowledge of General Education information will improve Description: Including pain rating scale, medication(s)/side effects and non-pharmacologic comfort measures Outcome: Progressing   Problem: Health Behavior/Discharge Planning: Goal: Ability to manage health-related needs will improve Outcome: Progressing   Problem: Clinical Measurements: Goal: Ability to maintain clinical measurements within normal limits will improve Outcome: Progressing Goal: Will remain free from infection Outcome: Progressing Goal: Diagnostic test results will improve Outcome: Progressing Goal: Respiratory complications will improve Outcome: Progressing Goal: Cardiovascular complication will be avoided Outcome: Progressing   Problem: Activity: Goal: Risk for activity intolerance will decrease Outcome: Progressing   Problem: Nutrition: Goal: Adequate nutrition will be maintained Outcome: Progressing   Problem: Coping: Goal: Level of anxiety will decrease Outcome: Progressing   Problem: Elimination: Goal: Will not experience complications related to bowel motility Outcome: Progressing Goal: Will not experience complications related to urinary retention Outcome: Progressing   Problem: Pain Managment: Goal: General experience of comfort will improve Outcome: Progressing   Problem: Safety: Goal: Ability to remain free from injury will improve Outcome: Progressing   Problem: Skin Integrity: Goal: Risk for impaired skin integrity will decrease Outcome: Progressing

## 2021-11-22 NOTE — Progress Notes (Signed)
Occupational Therapy Treatment Patient Details Name: Courtney Gray MRN: [id number] DOB: Aug 22, 1970 Today's Date: 11/22/2021   History of present illness Pt adm 7/28 with acute pulmonary edema. 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   OT comments  Patient received in bed side lying on left side. Patient states she had hydro wound care and is to stay off right side. Patient performed grooming and UB strengthening exercises at bed level. Patient was setup to supervision for grooming and verbal cues for HEP.  Patient to continue to be followed by acute OT.    Recommendations for follow up therapy are one component of a multi-disciplinary discharge planning process, led by the attending physician.  Recommendations may be updated based on patient status, additional functional criteria and insurance authorization.    Follow Up Recommendations  OT at Long-term acute care hospital    Assistance Recommended at Discharge Intermittent Supervision/Assistance  Patient can return home with the following  Two people to help with walking and/or transfers;Two people to help with bathing/dressing/bathroom;Assistance with cooking/housework;Direct supervision/assist for medications management;Assist for transportation;Help with stairs or ramp for entrance   Equipment Recommendations  Other (comment) (defer)    Recommendations for Other Services      Precautions / Restrictions Precautions Precautions: Fall Precaution Comments: bil AKA, 3L O2 at baseline, LUQ drain Restrictions Weight Bearing Restrictions: Yes RLE Weight Bearing: Non weight bearing LLE Weight Bearing: Non weight bearing Other Position/Activity Restrictions: minimized pushing/pulling and weight through L hand, dressing now removed       Mobility Bed Mobility Overal bed mobility: Needs Assistance Bed Mobility: Rolling Rolling: Min assist          General bed mobility comments: min assist with rolling to position self off of right ischial pressure wound    Transfers Overall transfer level: Needs assistance                 General transfer comment: seen at bed level     Balance       Sitting balance - Comments: seen at bed level                                   ADL either performed or assessed with clinical judgement   ADL Overall ADL's : Needs assistance/impaired     Grooming: Wash/dry hands;Wash/dry face;Oral care;Brushing hair;Set up;Bed level Grooming Details (indicate cue type and reason): at bed level due to pain at right ischial pressure wound following hydro wound care                               General ADL Comments: patient seen at bed level due to pain    Extremity/Trunk Assessment              Vision       Perception     Praxis      Cognition Arousal/Alertness: Awake/alert Behavior During Therapy: WFL for tasks assessed/performed Overall Cognitive Status: Within Functional Limits for tasks assessed                                          Exercises Exercises: General Upper Extremity General Exercises - Upper  Extremity Shoulder Flexion: Strengthening, Right, 10 reps, Theraband, Supine Theraband Level (Shoulder Flexion): Level 4 (Blue) Shoulder ABduction: Strengthening, Right, 10 reps, Theraband, Supine Theraband Level (Shoulder Abduction): Level 4 (Blue) Elbow Flexion: Strengthening, Both, 10 reps, Supine, Theraband Theraband Level (Elbow Flexion): Level 4 (Blue) Elbow Extension: Strengthening, Both, 10 reps, Supine, Theraband Theraband Level (Elbow Extension): Level 4 (Blue)    Shoulder Instructions       General Comments      Pertinent Vitals/ Pain       Pain Assessment Pain Assessment: Faces Faces Pain Scale: Hurts even more Pain Location: LLE and R ischial pressure wound Pain Descriptors / Indicators: Grimacing,  Guarding, Sore Pain Intervention(s): Limited activity within patient's tolerance, Monitored during session, Repositioned  Home Living                                          Prior Functioning/Environment              Frequency  Min 2X/week        Progress Toward Goals  OT Goals(current goals can now be found in the care plan section)  Progress towards OT goals: Progressing toward goals  Acute Rehab OT Goals Patient Stated Goal: get better OT Goal Formulation: With patient Time For Goal Achievement: 11/17/21 Potential to Achieve Goals: Fair ADL Goals Pt Will Perform Grooming: Independently;sitting;bed level Pt Will Perform Lower Body Bathing: with mod assist;with adaptive equipment;sitting/lateral leans Pt Will Perform Lower Body Dressing: with mod assist;with adaptive equipment;sitting/lateral leans Pt Will Transfer to Toilet: with mod assist;with transfer board;bedside commode Pt/caregiver will Perform Home Exercise Program: Increased strength;Both right and left upper extremity;With theraband;Independently Additional ADL Goal #1: Pt will perform bed mobility with supervision in prep for ADLs Additional ADL Goal #2: Pt will tolerate sitting up in wheelchair/recliner for 1.5 hours in order to improve activity tolerance for ADLs/mobility.  Plan Discharge plan remains appropriate;Frequency remains appropriate    Co-evaluation                 AM-PAC OT "6 Clicks" Daily Activity     Outcome Measure   Help from another person eating meals?: A Little Help from another person taking care of personal grooming?: A Little Help from another person toileting, which includes using toliet, bedpan, or urinal?: Total Help from another person bathing (including washing, rinsing, drying)?: A Lot Help from another person to put on and taking off regular upper body clothing?: A Little Help from another person to put on and taking off regular lower body  clothing?: A Lot 6 Click Score: 14    End of Session Equipment Utilized During Treatment: Oxygen  OT Visit Diagnosis: Muscle weakness (generalized) (M62.81);History of falling (Z91.81) Pain - Right/Left: Right Pain - part of body: Leg   Activity Tolerance Patient tolerated treatment well   Patient Left in bed;with call bell/phone within reach   Nurse Communication          Time: 0762-2633 OT Time Calculation (min): 36 min  Charges: OT General Charges $OT Visit: 1 Visit OT Treatments $Self Care/Home Management : 8-22 mins $Therapeutic Exercise: 8-22 mins  Lodema Hong, [place]  Office [place] 11/22/2021, 2:33 PM

## 2021-11-23 DIAGNOSIS — R7881 Bacteremia: Secondary | ICD-10-CM | POA: Diagnosis not present

## 2021-11-23 DIAGNOSIS — N186 End stage renal disease: Secondary | ICD-10-CM | POA: Diagnosis not present

## 2021-11-23 DIAGNOSIS — L89152 Pressure ulcer of sacral region, stage 2: Secondary | ICD-10-CM | POA: Diagnosis not present

## 2021-11-23 LAB — CBC
HCT: 29 % — ABNORMAL LOW (ref 36.0–46.0)
Hemoglobin: 8.9 g/dL — ABNORMAL LOW (ref 12.0–15.0)
MCH: 29.7 pg (ref 26.0–34.0)
MCHC: 30.7 g/dL (ref 30.0–36.0)
MCV: 96.7 fL (ref 80.0–100.0)
Platelets: 209 10*3/uL (ref 150–400)
RBC: 3 MIL/uL — ABNORMAL LOW (ref 3.87–5.11)
RDW: 16.7 % — ABNORMAL HIGH (ref 11.5–15.5)
WBC: 10.5 10*3/uL (ref 4.0–10.5)
nRBC: 0 % (ref 0.0–0.2)

## 2021-11-23 LAB — GLUCOSE, CAPILLARY
Glucose-Capillary: 99 mg/dL (ref 70–99)
Glucose-Capillary: 151 mg/dL — ABNORMAL HIGH (ref 70–99)
Glucose-Capillary: 98 mg/dL (ref 70–99)
Glucose-Capillary: 178 mg/dL — ABNORMAL HIGH (ref 70–99)

## 2021-11-23 LAB — BASIC METABOLIC PANEL
Anion gap: 11 (ref 5–15)
BUN: 23 mg/dL — ABNORMAL HIGH (ref 6–20)
CO2: 25 mmol/L (ref 22–32)
Calcium: 8.6 mg/dL — ABNORMAL LOW (ref 8.9–10.3)
Chloride: 98 mmol/L (ref 98–111)
Creatinine, Ser: 4.68 mg/dL — ABNORMAL HIGH (ref 0.44–1.00)
GFR, Estimated: 11 mL/min — ABNORMAL LOW (ref >60)
Glucose, Bld: 203 mg/dL — ABNORMAL HIGH (ref 70–99)
Potassium: 4 mmol/L (ref 3.5–5.1)
Sodium: 134 mmol/L — ABNORMAL LOW (ref 135–145)

## 2021-11-23 LAB — PHOSPHORUS: Phosphorus: 4.6 mg/dL (ref 2.5–4.6)

## 2021-11-23 MED ORDER — ORAL CARE MOUTH RINSE: 15.0000 mL | OROMUCOSAL | Status: DC | PRN

## 2021-11-23 NOTE — Progress Notes (Signed)
PROGRESS NOTE    Courtney Gray  BPZ:[id number] DOB: June 05, 1970 DOA: 11/01/2021 PCP: Monico Blitz, MD    Chief Complaint  Patient presents with   Vascular Access Problem    Brief Narrative: 51 y.o. female with medical history significant of ESRD on HD TTS, recent bacteremia and hepatic abscess on long-term IV antibiotics, chronic HF PEF, PVD status post bilateral AKA, IIDM, morbid obesity, chronic sacral wound stage II, presented with increasing shortness of breath.   Patient underwent bilateral AKA recently including right AKA in May and left AKA on 7/5.  And patient was recently diagnosed with septic shock secondary to bacteremia and liver abscess, for which patient has been on cefepime and Flagyl and the last dose will be August 4.  Recently, since patient has bilateral AKA, she has had extreme difficulty to get access to outpatient HD center.  She never missed her dialysis however according to outpatient HD records, it takes at least 4 staff members and [facility] lift for patient to be seated in the dialysis chair.  Starting today, patient started to feel shortness of breath, no chest pain no swelling.  No cough no fever or chills. She is now awaiting placement for HD that takes stretcher and is hoping to find one locally.   Assessment & Plan:   Active Problems:   End stage renal disease ([place])   Hypertension   Decubitus ulcer of sacral region, stage 2 (HCC)   Type 2 diabetes mellitus with hyperlipidemia (HCC)   Bacteremia   PVD (peripheral vascular disease) (HCC)   Class 3 obesity (HCC)  #1 acute on chronic diastolic CHF with chronic hypoxemic respiratory failure -Secondary to uncontrolled hypertension, patient with signs of mild volume overload on presentation. -Imdur held due to soft/low blood pressure.   -Continue Lopressor to 12.5 twice daily.   -Volume management per HD/nephrology.  2.  Hypertensive emergency -Was on Imdur, metoprolol. -Due to soft blood pressure during HD  Imdur held. -Continue Lopressor 12.5 mg twice daily.  3.  ESRD on HD -On admission admitting physician discussed with ED physician and case manager that patient will likely need long-term dialysis plan as patient currently in outpatient HD center had hard time accommodating patient's dialysis. -Nephrology consulted and managing hemodialysis. -TOC following and exploring options. -It is noted that patient recently moved from [place] to be with her daughter in [place] and insistent on finding a place in [place]. -It is noted that patient's daughter does not have a car even to drive patient if she is placed far away. -TOC following and helping with placement.  4.  Anemia of CKD -IV iron weekly per nephrology. -H&H stable.  5.  Constipation -Continue suppository as noted MiraLAX does not work well for patient. -Patient noted to have refused suppository and enema. -Continue bowel regimen of Colace twice daily, MiraLAX twice daily which patient seems to be refusing as well as Senokot-S daily which patient seems to be refusing.  6.  Nausea/vomiting -Resolved. -Tolerating current diet.  7.  Well-controlled insulin-dependent diabetes mellitus -Hemoglobin A1c 5.7 -CBG 99 this morning. -SSI.  8.  Morbid obesity -Lifestyle modification -Outpatient follow-up.  9.  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 -Patient receiving IV cefepime on HD and Flagyl which  was completed 8/4. -Dr. Algis Liming  consulted IR to evaluate for abdominal drain that was in place IR seen and drain removed 8/9. -Patient noted to have missed ID clinic appointment on 11/01/2021. -Patient seen by ID no  indication for further antibiotics at this time, IR to remove drain per ID, ID has since signed off. -Abdominal discomfort improved post drain removal.  10.  PAD -Status post bilateral AKA for gangrene with plan to follow-up with vascular surgery in a month (Dr. Stanford Breed on 11/21/2021 for staple removal) -Patient is status post left middle finger amputation for gangrene during recent admission. -Vascular surgery informed of patient's admission.  11.  Hyperlipidemia -Continue statin.  12.  Chronic respiratory failure with hypoxia -Patient noted to report uses 4 L/Min home O2 prior to admission. -Currently sats of 98% on 3 L nasal cannula.  13. Stage II right ischial wound, POA -Wound care consulted. -Per PT patient with increased drainage from wound and as such WOC reconsulted who has assessed patient and PT seen patient for hydrotherapy. -Continue current wound care as recommended by [facility] RN. Pressure Injury 09/11/21 Buttocks Right Stage 2 -  Partial thickness loss of dermis presenting as a shallow open injury with a red, pink wound bed without slough. pink; evolved to [place], end dated this wound as to not have dupliate (Active)  09/11/21 0000  Location: Buttocks  Location Orientation: Right  Staging: Stage 2 -  Partial thickness loss of dermis presenting as a shallow open injury with a red, pink wound bed without slough.  Wound Description (Comments): pink; evolved to [place], end dated this wound as to not have dupliate  Present on Admission: Yes     Pressure Injury 11/12/21 Hip Right Unstageable - Full thickness tissue loss in which the base of the injury is covered by slough (yellow, tan, gray, green or brown) and/or eschar (tan, brown or black) in the wound bed. 90% covered with slough (Active)  11/12/21 0800  Location: Hip  Location Orientation: Right  Staging: Unstageable - Full thickness tissue loss in which the base of the injury is covered by slough (yellow, tan, gray, green or brown)  and/or eschar (tan, brown or black) in the wound bed.  Wound Description (Comments): 90% covered with slough  Present on Admission: Yes (per RN)       DVT prophylaxis: Heparin Code Status: Full Family Communication: Updated patient.  No family at bedside. Disposition: TBD  Status is: Inpatient Remains inpatient appropriate because: Severely of illness   Consultants:  Nephrology: Dr. Moshe Cipro 11/01/2021 Wound care RN, Cecille Rubin McNichol 11/03/2021 ID: Dr.[email or web address] 11/13/2021  Procedures:  CT abdomen and pelvis 11/13/2021 Chest x-ray 11/01/2021  Antimicrobials:  Anti-infectives (From admission, onward)    Start     Dose/Rate Route Frequency Ordered Stop   11/04/21 1800  ceFEPIme (MAXIPIME) 2 g in sodium chloride 0.9 % 100 mL IVPB        2 g 200 mL/hr over 30 Minutes Intravenous Every M-W-F (1800) 11/03/21 0321 11/08/21 2359   11/04/21 1200  ceFEPIme (MAXIPIME) 2 g in sodium chloride 0.9 % 100 mL IVPB  Status:  Discontinued        2 g 200 mL/hr over 30 Minutes Intravenous Every M-W-F (Hemodialysis) 11/02/21 0941 11/03/21 0952   11/02/21 1800  ceFEPIme (MAXIPIME) 2 g in sodium chloride 0.9 % 100 mL IVPB        2 g 200 mL/hr over 30 Minutes Intravenous  Once 11/02/21 0941 11/02/21 1812   11/02/21 1200  ceFEPIme (MAXIPIME) 2 g in sodium chloride 0.9 % 100 mL IVPB  Status:  Discontinued        2 g 200 mL/hr over 30 Minutes Intravenous Every T-Th-Sa (Hemodialysis) 11/01/21 1934 11/02/21 0941   11/01/21 2200  metroNIDAZOLE (FLAGYL) tablet 500 mg        500 mg Oral Every 12 hours 11/01/21 1827 11/08/21 2225   11/01/21 1945  ceFEPIme (MAXIPIME) 1 g in sodium chloride 0.9 % 100 mL IVPB        1 g 200 mL/hr over 30 Minutes Intravenous  Once 11/01/21 1933 11/01/21 2032   11/01/21 1730  ceFEPIme (MAXIPIME) 1 g in sodium chloride 0.9 % 100 mL IVPB  Status:  Discontinued        1 g 200 mL/hr over 30 Minutes Intravenous Every 24 hours 11/01/21 1720 11/01/21 1933          Subjective: Patient laying in bed.  No chest pain.  No shortness of breath.  States feels ischial wound will need to be significantly healed before discharge as she is concerned about worsening with transfers from stretcher to do hemodialysis in the outpatient setting.   Objective: Vitals:   11/22/21 2155 11/23/21 0412 11/23/21 0825 11/23/21 1725  BP: (!) 96/26 (!) 140/53 (!) 133/40 (!) 130/54  Pulse: (!) 54 88 87 78  Resp: 18  17 17   Temp: 98.7 F (37.1 C) 98 F (36.7 C) 99 F (37.2 C) 97.9 F (36.6 C)  TempSrc: Oral  Oral Oral  SpO2: 92% 100% 97% 100%  Weight:      Height:        Intake/Output Summary (Last 24 hours) at 11/23/2021 1741 Last data filed at 11/23/2021 1700 Gross per 24 hour  Intake 940 ml  Output 3600 ml  Net -2660 ml    Filed Weights   11/20/21 0000 11/20/21 0847 11/20/21 1424  Weight: 125.6 kg 125.2 kg 122.5 kg    Examination:  General exam: NAD. Respiratory system: CTAB.  No wheezes, no crackles, no rhonchi.  Fair air movement.  Cardiovascular system: Regular rate and rhythm no murmurs rubs or gallops.  No JVD.  No lower extremity edema.  Gastrointestinal system: Abdomen is soft, nontender, nondistended, positive bowel sounds.  No rebound.  No guarding.  Central nervous system: Alert and oriented. No focal neurological deficits. Extremities: Status post B AKA Skin: Large right ischial wound/ulcer with some necrotic/loose nonviable tissue, moderate drainage not purulent.  Psychiatry: Judgement and insight appear normal. Mood & affect appropriate.     Data Reviewed: I have personally reviewed following labs and imaging studies  CBC: Recent Labs  Lab 11/18/21 1412 11/20/21 0906 11/21/21 0544 11/22/21 0514 11/23/21 0946  WBC 13.2* 11.9* 11.4* 10.7* 10.5  NEUTROABS  --   --  7.6  --   --   HGB 8.4* 7.8* 8.5* 8.6* 8.9*  HCT 27.7* 25.8* 27.3* 28.2* 29.0*  MCV 96.2 97.7 96.1 96.9 96.7  PLT 208 211 209 229 209     Basic Metabolic  Panel: Recent Labs  Lab 11/18/21 1430 11/20/21 0906 11/21/21 0544 11/22/21 0514 11/23/21 0946  NA 135 135 135 131* 134*  K 4.0 3.9 3.7 4.0 4.0  CL 99 98 97* 96* 98  CO2 26 27 28 24 25   GLUCOSE 128* 126* 96 179* 203*  BUN 42* 38* 27* 38* 23*  CREATININE 7.96* 6.87* 4.84* 6.56* 4.68*  CALCIUM 8.7* 8.5* 8.7* 8.5* 8.6*  PHOS 5.8* 6.3* 5.0* 6.4*  --  GFR: Estimated Creatinine Clearance: 18.7 mL/min (A) (by C-G formula based on SCr of 4.68 mg/dL (H)).  Liver Function Tests: Recent Labs  Lab 11/18/21 1430 11/20/21 0906 11/21/21 0544 11/22/21 0514  ALBUMIN 2.3* 2.2* 2.2* 2.3*     CBG: Recent Labs  Lab 11/22/21 1124 11/22/21 2156 11/23/21 0715 11/23/21 1135 11/23/21 1652  GLUCAP 199* 84 99 151* 98      No results found for this or any previous visit (from the past 240 hour(s)).       Radiology Studies: No results found.      Scheduled Meds:  atorvastatin  10 mg Oral Daily   darbepoetin (ARANESP) injection - DIALYSIS  100 mcg Intravenous Q Fri-HD   docusate sodium  100 mg Oral BID   feeding supplement  1 Container Oral TID BM   gabapentin  100 mg Oral BID   heparin  5,000 Units Subcutaneous Q12H   insulin aspart  0-5 Units Subcutaneous QHS   insulin aspart  0-6 Units Subcutaneous TID WC   leptospermum manuka honey  1 Application Topical Daily   levothyroxine  100 mcg Oral Q0600   metoprolol tartrate  12.5 mg Oral BID   pantoprazole  40 mg Oral Daily   polyethylene glycol  17 g Oral BID   senna  1 tablet Oral Daily   Continuous Infusions:  ferric gluconate (FERRLECIT) IVPB 125 mg (11/18/21 1422)     LOS: 22 days    Time spent: 35 minutes    Irine Seal, MD [facility]   To contact the attending provider between 7A-7P or the covering provider during after hours 7P-7A, please log into the web site [email or web address] and access using universal [place] password for that web site. If you do not have the password, please call the  hospital operator.  11/23/2021, 5:41 PM

## 2021-11-23 NOTE — Progress Notes (Signed)
Pt stated she did not want to get dialysis tonight because she didn't want to miss her daughter's visit. Stated she has "been waiting all day to get dialysis." Educated on the importance of compliance to getting dialysis. Pt stated she wanted to defer receiving dialysis for tomorrow.

## 2021-11-23 NOTE — Progress Notes (Signed)
[facility] Progress Note   Subjective:   Patient seen and examined in room.  Report TDC not working well yesterday, alarming a lot.  Cath flo dwell overnight with plan for HD today.  Denies CP, SOB, abdominal pain and n/v/d.    Objective Vitals:   11/22/21 2030 11/22/21 2155 11/23/21 0412 11/23/21 0825  BP: (!) 106/54 (!) 96/26 (!) 140/53 (!) 133/40  Pulse: 81 (!) 54 88 87  Resp:  18  17  Temp: 97.6 F (36.4 C) 98.7 F (37.1 C) 98 F (36.7 C) 99 F (37.2 C)  TempSrc: Oral Oral  Oral  SpO2: 100% 92% 100% 97%  Weight:      Height:       Physical Exam General:chronically ill appearing female in NAD Heart:RRR, no mrg Lungs:CTAB anteriorly  Abdomen:soft, NTND Extremities:b/l AKA, no edema Dialysis Access: [facility]   Filed Weights   11/20/21 0000 11/20/21 0847 11/20/21 1424  Weight: 125.6 kg 125.2 kg 122.5 kg    Intake/Output Summary (Last 24 hours) at 11/23/2021 1254 Last data filed at 11/23/2021 0830 Gross per 24 hour  Intake 480 ml  Output 3600 ml  Net -3120 ml    Additional Objective Labs: Basic Metabolic Panel: Recent Labs  Lab 11/20/21 0906 11/21/21 0544 11/22/21 0514 11/23/21 0946  NA 135 135 131* 134*  K 3.9 3.7 4.0 4.0  CL 98 97* 96* 98  CO2 27 28 24 25   GLUCOSE 126* 96 179* 203*  BUN 38* 27* 38* 23*  CREATININE 6.87* 4.84* 6.56* 4.68*  CALCIUM 8.5* 8.7* 8.5* 8.6*  PHOS 6.3* 5.0* 6.4*  --    Liver Function Tests: Recent Labs  Lab 11/20/21 0906 11/21/21 0544 11/22/21 0514  ALBUMIN 2.2* 2.2* 2.3*   CBC: Recent Labs  Lab 11/18/21 1412 11/20/21 0906 11/21/21 0544 11/22/21 0514 11/23/21 0946  WBC 13.2* 11.9* 11.4* 10.7* 10.5  NEUTROABS  --   --  7.6  --   --   HGB 8.4* 7.8* 8.5* 8.6* 8.9*  HCT 27.7* 25.8* 27.3* 28.2* 29.0*  MCV 96.2 97.7 96.1 96.9 96.7  PLT 208 211 209 229 209    Medications:  ferric gluconate (FERRLECIT) IVPB 125 mg (11/18/21 1422)    atorvastatin  10 mg Oral Daily   darbepoetin (ARANESP) injection -  DIALYSIS  100 mcg Intravenous Q Fri-HD   docusate sodium  100 mg Oral BID   feeding supplement  1 Container Oral TID BM   gabapentin  100 mg Oral BID   heparin  5,000 Units Subcutaneous Q12H   insulin aspart  0-5 Units Subcutaneous QHS   insulin aspart  0-6 Units Subcutaneous TID WC   leptospermum manuka honey  1 Application Topical Daily   levothyroxine  100 mcg Oral Q0600   metoprolol tartrate  12.5 mg Oral BID   pantoprazole  40 mg Oral Daily   polyethylene glycol  17 g Oral BID   senna  1 tablet Oral Daily    Dialysis Orders: MWF - [facility] ---?transferring to [facility] 4hrs, BFR 400, DFR 600,  AVF, EDW 146kg, 3K/ 2Ca - Heparin 2500 units with HD - Hectorol 8mcg IV qHD, held as of 10/30/21   Assessment/Plan: ESRD - on HD MWF. HD off schedule today d/t catheter malfunction yesterday and cathflo dwell overnight.  If unable to run today will need TDC exchanged.  2. Hypertension/volume - BP variable.  Metoprolol 25mg  BID started 11/04/21, decreased to 12.5mg  BID on 8/17.  Wide variation in recorded  bed weights. Continue 3-4L goals as tolerated. 3. Hypokalemia: Requiring intermittent supplementation.  Using 3K bath with HD but 4K is not available. supplement PRN. K stable today 4 4. Anemia of CKD - Hgb 8s. transferrin too low to calculate tsat. On IV iron weekly for now since she is off IV abx. Aranesp q Friday, increase dose for next week to 124mcg.  5. Secondary Hyperparathyroidism - Hectorol on hold d/t hypercalcemia.  Check PTH. Previously taking TUMS as binder but stopped d/t on 8/17. Last PO4 elevated. If remains elevated will need to restart binder, prefer one not Ca based.  Will recheck today.  6. Nutrition - Renal diet, have added supplements. Alb low. 7. Dispo - Very difficulty situation. Patient required 4 staff members and 2 hoyer pads for transfer into HD chair as OP. Not staying on HD for full treatment d/t pain for multiple decubitus ulcers. This is a major safety  concern for staff and patient in the outpatient center. Also concern for inability for ulcers to heal properly d/t frequent and rigorous transfers that are required. Need to determine new placement at a facility where she can receive HD in the bed-more likley a LTAC if possible.   8. Recent septic shock 2/2 bacteremia/liver abscess - Now s/p course of IV cefepime as of 11/08/21 9. Wounds - per PMD/wound care  Jen Mow, PA-C [place] 11/23/2021,12:54 PM  LOS: 22 days

## 2021-11-24 DIAGNOSIS — L89152 Pressure ulcer of sacral region, stage 2: Secondary | ICD-10-CM | POA: Diagnosis not present

## 2021-11-24 DIAGNOSIS — N186 End stage renal disease: Secondary | ICD-10-CM | POA: Diagnosis not present

## 2021-11-24 DIAGNOSIS — R7881 Bacteremia: Secondary | ICD-10-CM | POA: Diagnosis not present

## 2021-11-24 LAB — GLUCOSE, CAPILLARY
Glucose-Capillary: 97 mg/dL (ref 70–99)
Glucose-Capillary: 99 mg/dL (ref 70–99)
Glucose-Capillary: 105 mg/dL — ABNORMAL HIGH (ref 70–99)
Glucose-Capillary: 117 mg/dL — ABNORMAL HIGH (ref 70–99)

## 2021-11-24 NOTE — Progress Notes (Signed)
[facility] Progress Note   Subjective:   Patient seen and examined at bedside.  Reports she did not go to HD yesterday b/c her daughter was coming at the same time she was supposed to go and she did not want to miss her visit.  Encouraged compliance.  Denies CP, SOB, abdominal pain and n/v/d.    Objective Vitals:   11/23/21 1725 11/23/21 2111 11/24/21 0607 11/24/21 1010  BP: (!) 130/54 (!) 142/47 (!) 102/58 (!) 117/42  Pulse: 78 (!) 46 74 73  Resp: 17 19  18   Temp: 97.9 F (36.6 C) 98.4 F (36.9 C)  98.5 F (36.9 C)  TempSrc: Oral Oral  Oral  SpO2: 100% 99% 98% 96%  Weight:      Height:       Physical Exam General:chronically ill appearing female in NAD Heart:RRR, no mrg Lungs:CTAB, nml WOB on 3L O2 via [place] Abdomen:soft, NTND Extremities:no LE edema, b/l AKA Dialysis Access: [facility]   Filed Weights   11/20/21 0000 11/20/21 0847 11/20/21 1424  Weight: 125.6 kg 125.2 kg 122.5 kg    Intake/Output Summary (Last 24 hours) at 11/24/2021 1218 Last data filed at 11/24/2021 0607 Gross per 24 hour  Intake 480 ml  Output 0 ml  Net 480 ml    Additional Objective Labs: Basic Metabolic Panel: Recent Labs  Lab 11/21/21 0544 11/22/21 0514 11/23/21 0946  NA 135 131* 134*  K 3.7 4.0 4.0  CL 97* 96* 98  CO2 28 24 25   GLUCOSE 96 179* 203*  BUN 27* 38* 23*  CREATININE 4.84* 6.56* 4.68*  CALCIUM 8.7* 8.5* 8.6*  PHOS 5.0* 6.4* 4.6   Liver Function Tests: Recent Labs  Lab 11/20/21 0906 11/21/21 0544 11/22/21 0514  ALBUMIN 2.2* 2.2* 2.3*   CBC: Recent Labs  Lab 11/18/21 1412 11/20/21 0906 11/21/21 0544 11/22/21 0514 11/23/21 0946  WBC 13.2* 11.9* 11.4* 10.7* 10.5  NEUTROABS  --   --  7.6  --   --   HGB 8.4* 7.8* 8.5* 8.6* 8.9*  HCT 27.7* 25.8* 27.3* 28.2* 29.0*  MCV 96.2 97.7 96.1 96.9 96.7  PLT 208 211 209 229 209    Medications:  ferric gluconate (FERRLECIT) IVPB 125 mg (11/18/21 1422)    atorvastatin  10 mg Oral Daily   darbepoetin (ARANESP)  injection - DIALYSIS  100 mcg Intravenous Q Fri-HD   docusate sodium  100 mg Oral BID   feeding supplement  1 Container Oral TID BM   gabapentin  100 mg Oral BID   heparin  5,000 Units Subcutaneous Q12H   insulin aspart  0-5 Units Subcutaneous QHS   insulin aspart  0-6 Units Subcutaneous TID WC   leptospermum manuka honey  1 Application Topical Daily   levothyroxine  100 mcg Oral Q0600   metoprolol tartrate  12.5 mg Oral BID   pantoprazole  40 mg Oral Daily   polyethylene glycol  17 g Oral BID   senna  1 tablet Oral Daily    Dialysis Orders: MWF - [place] ---?transferring to [facility] 4hrs, BFR 400, DFR 600,  AVF, EDW 146kg, 3K/ 2Ca - Heparin 2500 units with HD - Hectorol 84mcg IV qHD, held as of 10/30/21   Assessment/Plan: ESRD - on HD MWF. Refused HD yesterday.  Plan for HD tomorrow 1st shift to ensure [facility] functioning properly.  Activase locked on Friday.  2. Hypertension/volume - BP variable.  Metoprolol 25mg  BID started 11/04/21, decreased to 12.5mg  BID on 8/17.  Wide  variation in recorded bed weights. Continue 3-4L goals as tolerated. 3. Hypokalemia: Requiring intermittent supplementation.  Using 3K bath with HD but 4K is not available. supplement PRN. Last K 4. 4. Anemia of CKD - Hgb 8s. transferrin too low to calculate tsat. On IV iron weekly for now since she is off IV abx. Aranesp q Friday, increase dose for next week to 119mcg.  5. Secondary Hyperparathyroidism - Hectorol on hold d/t hypercalcemia.  Check PTH. Previously taking TUMS as binder but stopped d/t on 8/17. Last PO4 elevated. If remains elevated will need to restart binder, prefer one not Ca based.  Will recheck today.  6. Nutrition - Renal diet, have added supplements. Alb low. 7. Dispo - Very difficulty situation. Patient required 4 staff members and 2 hoyer pads for transfer into HD chair as OP. Not staying on HD for full treatment d/t pain for multiple decubitus ulcers. This is a major safety concern for  staff and patient in the outpatient center. Also concern for inability for ulcers to heal properly d/t frequent and rigorous transfers that are required. Need to determine new placement at a facility where she can receive HD in the bed-more likley a LTAC if possible.   8. Recent septic shock 2/2 bacteremia/liver abscess - Now s/p course of IV cefepime as of 11/08/21 9. Wounds - per PMD/wound care  Jen Mow, PA-C [place] 11/24/2021,12:18 PM  LOS: 23 days

## 2021-11-24 NOTE — Progress Notes (Signed)
PROGRESS NOTE    Courtney Gray  CVE:[id number] DOB: [birth year] DOA: 11/01/2021 PCP: Monico Blitz, MD    Chief Complaint  Patient presents with   Vascular Access Problem    Brief Narrative: 51 y.o. female with medical history significant of ESRD on HD TTS, recent bacteremia and hepatic abscess on long-term IV antibiotics, chronic HF PEF, PVD status post bilateral AKA, IIDM, morbid obesity, chronic sacral wound stage II, presented with increasing shortness of breath.   Patient underwent bilateral AKA recently including right AKA in May and left AKA on 7/5.  And patient was recently diagnosed with septic shock secondary to bacteremia and liver abscess, for which patient has been on cefepime and Flagyl and the last dose will be August 4.  Recently, since patient has bilateral AKA, she has had extreme difficulty to get access to outpatient HD center.  She never missed her dialysis however according to outpatient HD records, it takes at least 4 staff members and [facility] lift for patient to be seated in the dialysis chair.  Starting today, patient started to feel shortness of breath, no chest pain no swelling.  No cough no fever or chills. She is now awaiting placement for HD that takes stretcher and is hoping to find one locally.   Assessment & Plan:   Active Problems:   End stage renal disease ([place])   Hypertension   Decubitus ulcer of sacral region, stage 2 (HCC)   Type 2 diabetes mellitus with hyperlipidemia (HCC)   Bacteremia   PVD (peripheral vascular disease) (HCC)   Class 3 obesity (HCC)  #1 acute on chronic diastolic CHF with chronic hypoxemic respiratory failure -Secondary to uncontrolled hypertension, patient with signs of mild volume overload on presentation. -Imdur held due to soft/low blood pressure.   -Continue Lopressor to 12.5 twice daily.   -Volume management per HD/nephrology.  2.  Hypertensive emergency -Was on Imdur, metoprolol. -Due to soft blood pressure during HD  Imdur held. -Continue Lopressor 12.5 mg twice daily.  3.  ESRD on HD -On admission admitting physician discussed with ED physician and case manager that patient will likely need long-term dialysis plan as patient currently in outpatient HD center had hard time accommodating patient's dialysis. -Nephrology consulted and managing hemodialysis. -TOC following and exploring options. -It is noted that patient recently moved from [place] to be with her daughter in [place] and insistent on finding a place in [place]. -It is noted that patient's daughter does not have a car even to drive patient if she is placed far away. -TOC following and helping with placement.  4.  Anemia of CKD -IV iron weekly per nephrology. -H&H stable.  5.  Constipation -Continue suppository as noted MiraLAX does not work well for patient. -Patient noted to have refused suppository and enema. -Continue bowel regimen of Colace twice daily, MiraLAX twice daily which patient seems to be refusing as well as Senokot-S daily which patient seems to be refusing.  6.  Nausea/vomiting -Resolved. -Tolerating current diet.  7.  Well-controlled insulin-dependent diabetes mellitus -Hemoglobin A1c 5.7 -CBG 97 this morning. -SSI.  8.  Morbid obesity -Lifestyle modification -Outpatient follow-up.  9.  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 -Patient receiving IV cefepime on HD and Flagyl which  was completed 8/4. -Dr. Algis Liming  consulted IR to evaluate for abdominal drain that was in place IR seen and drain removed 8/9. -Patient noted to have missed ID clinic appointment on 11/01/2021. -Patient seen by ID no  indication for further antibiotics at this time, IR to remove drain per ID, ID has since signed off. -Abdominal discomfort improved post drain removal.  10.  PAD -Status post bilateral AKA for gangrene with plan to follow-up with vascular surgery in a month (Dr. Stanford Breed on 11/21/2021 for staple removal) -Patient is status post left middle finger amputation for gangrene during recent admission. -Vascular surgery informed of patient's admission.  11.  Hyperlipidemia -Statin.  12.  Chronic respiratory failure with hypoxia -Patient noted to report uses 4 L/Min home O2 prior to admission. -Currently sats of 96-99% on 3 L nasal cannula.  13. Stage II right ischial wound, POA -Wound care consulted. -Per PT patient with increased drainage from wound and as such WOC reconsulted who has assessed patient and PT seen patient for hydrotherapy. -Continue current wound care as recommended by [facility] RN. Pressure Injury 09/11/21 Buttocks Right Stage 2 -  Partial thickness loss of dermis presenting as a shallow open injury with a red, pink wound bed without slough. pink; evolved to [place], end dated this wound as to not have dupliate (Active)  09/11/21 0000  Location: Buttocks  Location Orientation: Right  Staging: Stage 2 -  Partial thickness loss of dermis presenting as a shallow open injury with a red, pink wound bed without slough.  Wound Description (Comments): pink; evolved to [place], end dated this wound as to not have dupliate  Present on Admission: Yes     Pressure Injury 11/12/21 Hip Right Unstageable - Full thickness tissue loss in which the base of the injury is covered by slough (yellow, tan, gray, green or brown) and/or eschar (tan, brown or black) in the wound bed. 90% covered with slough (Active)  11/12/21 0800  Location: Hip  Location Orientation: Right  Staging: Unstageable - Full thickness tissue loss in which the base of the injury is covered by slough (yellow, tan, gray, green or brown) and/or  eschar (tan, brown or black) in the wound bed.  Wound Description (Comments): 90% covered with slough  Present on Admission: Yes (per RN)     Pressure Injury 11/24/21 Flank Left;Lower Stage 2 -  Partial thickness loss of dermis presenting as a shallow open injury with a red, pink wound bed without slough. (Active)  11/24/21 0430  Location: Flank  Location Orientation: Left;Lower  Staging: Stage 2 -  Partial thickness loss of dermis presenting as a shallow open injury with a red, pink wound bed without slough.  Wound Description (Comments):   Present on Admission: No       DVT prophylaxis: Heparin Code Status: Full Family Communication: Updated patient.  No family at bedside. Disposition: TBD  Status is: Inpatient Remains inpatient appropriate because: Severely of illness   Consultants:  Nephrology: Dr. Moshe Cipro 11/01/2021 Wound care RN, Cecille Rubin McNichol 11/03/2021 ID: Dr.[email or web address] 11/13/2021  Procedures:  CT abdomen and pelvis 11/13/2021 Chest x-ray 11/01/2021  Antimicrobials:  Anti-infectives (From admission, onward)    Start     Dose/Rate Route Frequency Ordered Stop   11/04/21 1800  ceFEPIme (MAXIPIME) 2 g in sodium chloride 0.9 % 100 mL IVPB        2 g 200 mL/hr over 30 Minutes Intravenous Every M-W-F (1800) 11/03/21 4193 11/08/21 2359   11/04/21 1200  ceFEPIme (MAXIPIME) 2  g in sodium chloride 0.9 % 100 mL IVPB  Status:  Discontinued        2 g 200 mL/hr over 30 Minutes Intravenous Every M-W-F (Hemodialysis) 11/02/21 0941 11/03/21 0952   11/02/21 1800  ceFEPIme (MAXIPIME) 2 g in sodium chloride 0.9 % 100 mL IVPB        2 g 200 mL/hr over 30 Minutes Intravenous  Once 11/02/21 0941 11/02/21 1812   11/02/21 1200  ceFEPIme (MAXIPIME) 2 g in sodium chloride 0.9 % 100 mL IVPB  Status:  Discontinued        2 g 200 mL/hr over 30 Minutes Intravenous Every T-Th-Sa (Hemodialysis) 11/01/21 1934 11/02/21 0941   11/01/21 2200  metroNIDAZOLE (FLAGYL) tablet 500 mg        500 mg Oral  Every 12 hours 11/01/21 1827 11/08/21 2225   11/01/21 1945  ceFEPIme (MAXIPIME) 1 g in sodium chloride 0.9 % 100 mL IVPB        1 g 200 mL/hr over 30 Minutes Intravenous  Once 11/01/21 1933 11/01/21 2032   11/01/21 1730  ceFEPIme (MAXIPIME) 1 g in sodium chloride 0.9 % 100 mL IVPB  Status:  Discontinued        1 g 200 mL/hr over 30 Minutes Intravenous Every 24 hours 11/01/21 1720 11/01/21 1933         Subjective: Sitting up in bed.  No chest pain.  No shortness of breath.  No abdominal pain.   Objective: Vitals:   11/23/21 1725 11/23/21 2111 11/24/21 0607 11/24/21 1010  BP: (!) 130/54 (!) 142/47 (!) 102/58 (!) 117/42  Pulse: 78 (!) 46 74 73  Resp: 17 19  18   Temp: 97.9 F (36.6 C) 98.4 F (36.9 C)  98.5 F (36.9 C)  TempSrc: Oral Oral  Oral  SpO2: 100% 99% 98% 96%  Weight:      Height:        Intake/Output Summary (Last 24 hours) at 11/24/2021 1227 Last data filed at 11/24/2021 8119 Gross per 24 hour  Intake 480 ml  Output 0 ml  Net 480 ml    Filed Weights   11/20/21 0000 11/20/21 0847 11/20/21 1424  Weight: 125.6 kg 125.2 kg 122.5 kg    Examination:  General exam: NAD Respiratory system: Lungs clear to auscultation bilaterally.  No wheezes, no crackles, no rhonchi.  Fair air movement. Cardiovascular system: RRR no murmurs rubs or gallops.  No JVD.  No lower extremity edema.  Gastrointestinal system: Abdomen is soft, nontender, nondistended, positive bowel sounds.  No rebound.  No guarding.   Central nervous system: Alert and oriented. No focal neurological deficits. Extremities: Status post B AKA Skin: Large right ischial wound/ulcer with some necrotic/loose nonviable tissue, moderate drainage not purulent.  Psychiatry: Judgement and insight appear normal. Mood & affect appropriate.     Data Reviewed: I have personally reviewed following labs and imaging studies  CBC: Recent Labs  Lab 11/18/21 1412 11/20/21 0906 11/21/21 0544 11/22/21 0514  11/23/21 0946  WBC 13.2* 11.9* 11.4* 10.7* 10.5  NEUTROABS  --   --  7.6  --   --   HGB 8.4* 7.8* 8.5* 8.6* 8.9*  HCT 27.7* 25.8* 27.3* 28.2* 29.0*  MCV 96.2 97.7 96.1 96.9 96.7  PLT 208 211 209 229 209     Basic Metabolic Panel: Recent Labs  Lab 11/18/21 1430 11/20/21 0906 11/21/21 0544 11/22/21 0514 11/23/21 0946  NA 135 135 135 131* 134*  K 4.0 3.9 3.7 4.0 4.0  CL 99 98 97* 96* 98  CO2 26 27 28 24 25   GLUCOSE 128* 126* 96 179* 203*  BUN 42* 38* 27* 38* 23*  CREATININE 7.96* 6.87* 4.84* 6.56* 4.68*  CALCIUM 8.7* 8.5* 8.7* 8.5* 8.6*  PHOS 5.8* 6.3* 5.0* 6.4* 4.6     GFR: Estimated Creatinine Clearance: 18.7 mL/min (A) (by C-G formula based on SCr of 4.68 mg/dL (H)).  Liver Function Tests: Recent Labs  Lab 11/18/21 1430 11/20/21 0906 11/21/21 0544 11/22/21 0514  ALBUMIN 2.3* 2.2* 2.2* 2.3*     CBG: Recent Labs  Lab 11/23/21 1135 11/23/21 1652 11/23/21 2112 11/24/21 0737 11/24/21 1140  GLUCAP 151* 98 178* 97 99      No results found for this or any previous visit (from the past 240 hour(s)).       Radiology Studies: No results found.      Scheduled Meds:  atorvastatin  10 mg Oral Daily   darbepoetin (ARANESP) injection - DIALYSIS  100 mcg Intravenous Q Fri-HD   docusate sodium  100 mg Oral BID   feeding supplement  1 Container Oral TID BM   gabapentin  100 mg Oral BID   heparin  5,000 Units Subcutaneous Q12H   insulin aspart  0-5 Units Subcutaneous QHS   insulin aspart  0-6 Units Subcutaneous TID WC   leptospermum manuka honey  1 Application Topical Daily   levothyroxine  100 mcg Oral Q0600   metoprolol tartrate  12.5 mg Oral BID   pantoprazole  40 mg Oral Daily   polyethylene glycol  17 g Oral BID   senna  1 tablet Oral Daily   Continuous Infusions:  ferric gluconate (FERRLECIT) IVPB 125 mg (11/18/21 1422)     LOS: 23 days    Time spent: 35 minutes    Irine Seal, MD [facility]   To contact the attending  provider between 7A-7P or the covering provider during after hours 7P-7A, please log into the web site [email or web address] and access using universal [place] password for that web site. If you do not have the password, please call the hospital operator.  11/24/2021, 12:27 PM

## 2021-11-25 DIAGNOSIS — N186 End stage renal disease: Secondary | ICD-10-CM | POA: Diagnosis not present

## 2021-11-25 DIAGNOSIS — I161 Hypertensive emergency: Secondary | ICD-10-CM

## 2021-11-25 DIAGNOSIS — E669 Obesity, unspecified: Secondary | ICD-10-CM | POA: Diagnosis not present

## 2021-11-25 DIAGNOSIS — I159 Secondary hypertension, unspecified: Secondary | ICD-10-CM | POA: Diagnosis not present

## 2021-11-25 DIAGNOSIS — K5900 Constipation, unspecified: Secondary | ICD-10-CM

## 2021-11-25 LAB — CBC
HCT: 26.7 % — ABNORMAL LOW (ref 36.0–46.0)
Hemoglobin: 8.1 g/dL — ABNORMAL LOW (ref 12.0–15.0)
MCH: 30 pg (ref 26.0–34.0)
MCHC: 30.3 g/dL (ref 30.0–36.0)
MCV: 98.9 fL (ref 80.0–100.0)
Platelets: 254 10*3/uL (ref 150–400)
RBC: 2.7 MIL/uL — ABNORMAL LOW (ref 3.87–5.11)
RDW: 16.6 % — ABNORMAL HIGH (ref 11.5–15.5)
WBC: 13.1 10*3/uL — ABNORMAL HIGH (ref 4.0–10.5)
nRBC: 0 % (ref 0.0–0.2)

## 2021-11-25 LAB — RENAL FUNCTION PANEL
Albumin: 2.3 g/dL — ABNORMAL LOW (ref 3.5–5.0)
Anion gap: 14 (ref 5–15)
BUN: 47 mg/dL — ABNORMAL HIGH (ref 6–20)
CO2: 23 mmol/L (ref 22–32)
Calcium: 8.6 mg/dL — ABNORMAL LOW (ref 8.9–10.3)
Chloride: 98 mmol/L (ref 98–111)
Creatinine, Ser: 8.14 mg/dL — ABNORMAL HIGH (ref 0.44–1.00)
GFR, Estimated: 6 mL/min — ABNORMAL LOW (ref >60)
Glucose, Bld: 137 mg/dL — ABNORMAL HIGH (ref 70–99)
Phosphorus: 8.3 mg/dL — ABNORMAL HIGH (ref 2.5–4.6)
Potassium: 4.4 mmol/L (ref 3.5–5.1)
Sodium: 135 mmol/L (ref 135–145)

## 2021-11-25 LAB — GLUCOSE, CAPILLARY
Glucose-Capillary: 141 mg/dL — ABNORMAL HIGH (ref 70–99)
Glucose-Capillary: 103 mg/dL — ABNORMAL HIGH (ref 70–99)
Glucose-Capillary: 214 mg/dL — ABNORMAL HIGH (ref 70–99)
Glucose-Capillary: 127 mg/dL — ABNORMAL HIGH (ref 70–99)

## 2021-11-25 LAB — PARATHYROID HORMONE, INTACT (NO CA): PTH: 35 pg/mL (ref 15–65)

## 2021-11-25 LAB — HEPATITIS B CORE ANTIBODY, TOTAL: Hep B Core Total Ab: NONREACTIVE

## 2021-11-25 LAB — HEPATITIS B SURFACE ANTIGEN: Hepatitis B Surface Ag: NONREACTIVE

## 2021-11-25 MED ORDER — VANCOMYCIN HCL IN DEXTROSE 1-5 GM/200ML-% IV SOLN
1000.0000 mg | INTRAVENOUS | Status: AC
  Filled 2021-11-25: qty 200

## 2021-11-25 MED ORDER — LIDOCAINE HCL (PF) 1 % IJ SOLN: 5.0000 mL | INTRAMUSCULAR | Status: DC | PRN

## 2021-11-25 MED ORDER — ALTEPLASE 2 MG IJ SOLR
INTRAMUSCULAR | Status: AC
  Filled 2021-11-25: qty 4

## 2021-11-25 MED ORDER — PENTAFLUOROPROP-TETRAFLUOROETH EX AERO: 1.0000 | INHALATION_SPRAY | CUTANEOUS | Status: DC | PRN

## 2021-11-25 MED ORDER — HEPARIN SODIUM (PORCINE) 1000 UNIT/ML DIALYSIS: 1000.0000 [IU] | INTRAMUSCULAR | Status: DC | PRN

## 2021-11-25 MED ORDER — ANTICOAGULANT SODIUM CITRATE 4% (200MG/5ML) IV SOLN: 5.0000 mL | Status: DC | PRN

## 2021-11-25 MED ORDER — ALTEPLASE 2 MG IJ SOLR
2.0000 mg | Freq: Once | INTRAMUSCULAR | Status: AC | PRN
  Administered 2021-11-25: 4 mg

## 2021-11-25 MED ORDER — HEPARIN SODIUM (PORCINE) 1000 UNIT/ML DIALYSIS
2500.0000 [IU] | INTRAMUSCULAR | Status: DC | PRN
  Administered 2021-11-25: 2500 [IU] via INTRAVENOUS_CENTRAL
  Filled 2021-11-25: qty 3

## 2021-11-25 MED ORDER — LIDOCAINE-PRILOCAINE 2.5-2.5 % EX CREA
1.0000 | TOPICAL_CREAM | CUTANEOUS | Status: DC | PRN
Start: 2021-11-25 — End: 2021-11-25

## 2021-11-25 NOTE — Progress Notes (Signed)
Received patient in bed to unit. Alert and oriented. Informed consent signed and in  chart.   Treatment was not completed due to catheter issues. High arterial pressure alarmed. Flushed well, BFR decreased to 280, Heparin PRN given with no success. Discussed with provider who advised attempted to schedule with IR will take place possibly today.   Transported back to the room alert, without acute distress. Report given to patient's floor nurse.   Access used: Left HD cath Access issues: clotting, high arterial pressures.   Total UF removed: 0 mL Medication(s) given: n/a  Post HD VS: 92/76 100% 78 26 97.9  Post HD weight: 122kg    [facility]

## 2021-11-25 NOTE — Progress Notes (Addendum)
PROGRESS NOTE    Courtney Gray  STM:[id number] DOB: [birth year] DOA: 11/01/2021 PCP: Monico Blitz, MD    Chief Complaint  Patient presents with   Vascular Access Problem    Brief Narrative: 51 y.o. female with medical history significant of ESRD on HD TTS, recent bacteremia and hepatic abscess on long-term IV antibiotics, chronic HF PEF, PVD status post bilateral AKA, IIDM, morbid obesity, chronic sacral wound stage II, presented with increasing shortness of breath.   Patient underwent bilateral AKA recently including right AKA in May and left AKA on 7/5.  And patient was recently diagnosed with septic shock secondary to bacteremia and liver abscess, for which patient has been on cefepime and Flagyl and the last dose will be August 4.  Recently, since patient has bilateral AKA, she has had extreme difficulty to get access to outpatient HD center.  She never missed her dialysis however according to outpatient HD records, it takes at least 4 staff members and [facility] lift for patient to be seated in the dialysis chair.  Starting today, patient started to feel shortness of breath, no chest pain no swelling.  No cough no fever or chills. She is now awaiting placement for HD that takes stretcher and is hoping to find one locally.   Assessment & Plan:   Active Problems:   End stage renal disease ([place])   Hypertension   Decubitus ulcer of sacral region, stage 2 (HCC)   Type 2 diabetes mellitus with hyperlipidemia (HCC)   Bacteremia   PVD (peripheral vascular disease) (HCC)   Class 3 obesity (HCC)  1 acute on chronic diastolic CHF with chronic hypoxemic respiratory failure -Secondary to uncontrolled hypertension, patient with signs of mild volume overload on presentation. -Imdur held due to soft/low blood pressure.   -Continue Lopressor to 12.5 twice daily.   -Volume management per HD/nephrology.  2.  Hypertensive emergency -Was on Imdur, metoprolol. -Due to soft blood pressure during HD  Imdur held. -Blood pressure stable on current regimen of Lopressor 12 mg twice daily.   3.  ESRD on HD -On admission admitting physician discussed with ED physician and case manager that patient will likely need long-term dialysis plan as patient currently in outpatient HD center had hard time accommodating patient's dialysis. -Nephrology consulted and managing hemodialysis. -Patient noted during HD treatment to have catheter issues and hemodialysis stopped.  IR consulted for hemodialysis catheter exchange. -TOC following and exploring options. -It is noted that patient recently moved from [place] to be with her daughter in [place] and insistent on finding a place in [place]. -It is noted that patient's daughter does not have a car even to drive patient if she is placed far away. -TOC following and helping with placement.  4.  Anemia of CKD -IV iron weekly per nephrology. -H&H stable.  5.  Constipation -Continue suppository as noted MiraLAX does not work well for patient. -Patient noted to have refused suppository and enema. -Continue bowel regimen of Colace twice daily, MiraLAX twice daily which patient seems to be refusing as well as Senokot-S daily which patient seems to be refusing.  6.  Nausea/vomiting -Resolved. -Tolerating current diet.  7.  Well-controlled insulin-dependent diabetes mellitus -Hemoglobin A1c 5.7 -CBG 141 this morning. -SSI.  8.  Morbid obesity -Lifestyle modification -Outpatient follow-up.  9.  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 -Patient receiving IV cefepime on HD and Flagyl which was completed 8/4. -Dr. Algis Liming  consulted IR to  evaluate for abdominal drain that was in place IR seen and drain removed 8/9. -Patient noted to have missed ID clinic appointment on 11/01/2021. -Patient seen by ID no indication for further antibiotics at this time, IR to remove drain per ID, ID has since signed off. -Abdominal discomfort improved post drain removal.  10.  PAD -Status post bilateral AKA for gangrene with plan to follow-up with vascular surgery in a month (Dr. Stanford Breed on 11/21/2021 for staple removal) -Patient is status post left middle finger amputation for gangrene during recent admission. -Vascular surgery informed of patient's admission.  11.  Hyperlipidemia -Continue statin.  12.  Chronic respiratory failure with hypoxia -Patient noted to report uses 4 L/Min home O2 prior to admission. -Currently sats of 100 % on 4 L nasal cannula.  13. Stage II right ischial wound, POA -Wound care consulted. -Per PT patient with increased drainage from wound and as such WOC reconsulted who has assessed patient and PT seen patient for hydrotherapy. -Continue current wound care as recommended by [facility] RN. Pressure Injury 09/11/21 Buttocks Right Stage 2 -  Partial thickness loss of dermis presenting as a shallow open injury with a red, pink wound bed without slough. pink; evolved to [place], end dated this wound as to not have dupliate (Active)  09/11/21 0000  Location: Buttocks  Location Orientation: Right  Staging: Stage 2 -  Partial thickness loss of dermis presenting as a shallow open injury with a red, pink wound bed without slough.  Wound Description (Comments): pink; evolved to [place], end dated this wound as to not have dupliate  Present on Admission: Yes     Pressure Injury 11/12/21 Hip Right Unstageable - Full thickness tissue loss in which the base of the injury is covered by slough (yellow, tan, gray, green or brown) and/or eschar (tan, brown or black) in the wound bed. 90% covered with slough (Active)  11/12/21 0800  Location: Hip   Location Orientation: Right  Staging: Unstageable - Full thickness tissue loss in which the base of the injury is covered by slough (yellow, tan, gray, green or brown) and/or eschar (tan, brown or black) in the wound bed.  Wound Description (Comments): 90% covered with slough  Present on Admission: Yes (per RN)     Pressure Injury 11/24/21 Flank Left;Lower Stage 2 -  Partial thickness loss of dermis presenting as a shallow open injury with a red, pink wound bed without slough. (Active)  11/24/21 0430  Location: Flank  Location Orientation: Left;Lower  Staging: Stage 2 -  Partial thickness loss of dermis presenting as a shallow open injury with a red, pink wound bed without slough.  Wound Description (Comments):   Present on Admission: No       DVT prophylaxis: Heparin Code Status: Full Family Communication: Updated patient, and daughter at bedside.  Disposition: TBD  Status is: Inpatient Remains inpatient appropriate because: Severely of illness   Consultants:  Nephrology: Dr. Moshe Cipro 11/01/2021 Wound care RN, Cecille Rubin McNichol 11/03/2021 ID: Dr.[email or web address] 11/13/2021  Procedures:  CT abdomen and pelvis 11/13/2021 Chest x-ray 11/01/2021  Antimicrobials:  Anti-infectives (From admission, onward)    Start     Dose/Rate Route Frequency Ordered Stop   11/04/21 1800  ceFEPIme (MAXIPIME) 2 g in sodium chloride 0.9 % 100 mL IVPB  2 g 200 mL/hr over 30 Minutes Intravenous Every M-W-F (1800) 11/03/21 1937 11/08/21 2359   11/04/21 1200  ceFEPIme (MAXIPIME) 2 g in sodium chloride 0.9 % 100 mL IVPB  Status:  Discontinued        2 g 200 mL/hr over 30 Minutes Intravenous Every M-W-F (Hemodialysis) 11/02/21 0941 11/03/21 0952   11/02/21 1800  ceFEPIme (MAXIPIME) 2 g in sodium chloride 0.9 % 100 mL IVPB        2 g 200 mL/hr over 30 Minutes Intravenous  Once 11/02/21 0941 11/02/21 1812   11/02/21 1200  ceFEPIme (MAXIPIME) 2 g in sodium chloride 0.9 % 100 mL IVPB  Status:  Discontinued         2 g 200 mL/hr over 30 Minutes Intravenous Every T-Th-Sa (Hemodialysis) 11/01/21 1934 11/02/21 0941   11/01/21 2200  metroNIDAZOLE (FLAGYL) tablet 500 mg        500 mg Oral Every 12 hours 11/01/21 1827 11/08/21 2225   11/01/21 1945  ceFEPIme (MAXIPIME) 1 g in sodium chloride 0.9 % 100 mL IVPB        1 g 200 mL/hr over 30 Minutes Intravenous  Once 11/01/21 1933 11/01/21 2032   11/01/21 1730  ceFEPIme (MAXIPIME) 1 g in sodium chloride 0.9 % 100 mL IVPB  Status:  Discontinued        1 g 200 mL/hr over 30 Minutes Intravenous Every 24 hours 11/01/21 1720 11/01/21 1933         Subjective: Sitting up in bed.  No chest pain.  No shortness of breath.  No abdominal pain.  Daughter at bedside.  Concerned if she goes to SNF before pressure wound is fully healed she will not get adequate wound care/dressing changes as recommended.  Objective: Vitals:   11/25/21 0816 11/25/21 0820 11/25/21 0830 11/25/21 0908  BP: (!) 117/48 (!) 118/54 (!) 93/41 92/76  Pulse: 76 75 75 78  Resp: 16 13 19  (!) 26  Temp: 98.5 F (36.9 C)   97.9 F (36.6 C)  TempSrc:      SpO2: 100% 99% 100% 100%  Weight: 122 kg   122 kg  Height:        Intake/Output Summary (Last 24 hours) at 11/25/2021 1248 Last data filed at 11/25/2021 1100 Gross per 24 hour  Intake 580 ml  Output -100 ml  Net 680 ml    Filed Weights   11/20/21 1424 11/25/21 0816 11/25/21 0908  Weight: 122.5 kg 122 kg 122 kg    Examination:  General exam: NAD. Respiratory system: CTA B.  No wheezes, no crackles, no rhonchi.  Fair air movement.  Speaking in full sentences. Cardiovascular system: Regular rate rhythm no murmurs rubs or gallops.  No JVD.  No lower extremity edema.  Gastrointestinal system: Abdomen is soft, nontender, nondistended, positive bowel sounds.  No rebound.  No guarding.   Central nervous system: Alert and oriented. No focal neurological deficits. Extremities: Status post B AKA Skin: Large right ischial wound/ulcer with  some necrotic/loose nonviable tissue, decreased drainage not purulent.  Psychiatry: Judgement and insight appear normal. Mood & affect appropriate.     Data Reviewed: I have personally reviewed following labs and imaging studies  CBC: Recent Labs  Lab 11/20/21 0906 11/21/21 0544 11/22/21 0514 11/23/21 0946 11/25/21 0814  WBC 11.9* 11.4* 10.7* 10.5 13.1*  NEUTROABS  --  7.6  --   --   --   HGB 7.8* 8.5* 8.6* 8.9* 8.1*  HCT 25.8* 27.3* 28.2* 29.0*  26.7*  MCV 97.7 96.1 96.9 96.7 98.9  PLT 211 209 229 209 254     Basic Metabolic Panel: Recent Labs  Lab 11/20/21 0906 11/21/21 0544 11/22/21 0514 11/23/21 0946 11/25/21 0814  NA 135 135 131* 134* 135  K 3.9 3.7 4.0 4.0 4.4  CL 98 97* 96* 98 98  CO2 27 28 24 25 23   GLUCOSE 126* 96 179* 203* 137*  BUN 38* 27* 38* 23* 47*  CREATININE 6.87* 4.84* 6.56* 4.68* 8.14*  CALCIUM 8.5* 8.7* 8.5* 8.6* 8.6*  PHOS 6.3* 5.0* 6.4* 4.6 8.3*     GFR: Estimated Creatinine Clearance: 10.7 mL/min (A) (by C-G formula based on SCr of 8.14 mg/dL (H)).  Liver Function Tests: Recent Labs  Lab 11/18/21 1430 11/20/21 0906 11/21/21 0544 11/22/21 0514 11/25/21 0814  ALBUMIN 2.3* 2.2* 2.2* 2.3* 2.3*     CBG: Recent Labs  Lab 11/24/21 1140 11/24/21 1642 11/24/21 2147 11/25/21 0734 11/25/21 1134  GLUCAP 99 105* 117* 141* 103*      No results found for this or any previous visit (from the past 240 hour(s)).       Radiology Studies: No results found.      Scheduled Meds:  alteplase       atorvastatin  10 mg Oral Daily   darbepoetin (ARANESP) injection - DIALYSIS  100 mcg Intravenous Q Fri-HD   docusate sodium  100 mg Oral BID   feeding supplement  1 Container Oral TID BM   gabapentin  100 mg Oral BID   heparin  5,000 Units Subcutaneous Q12H   insulin aspart  0-5 Units Subcutaneous QHS   insulin aspart  0-6 Units Subcutaneous TID WC   leptospermum manuka honey  1 Application Topical Daily   levothyroxine  100 mcg  Oral Q0600   metoprolol tartrate  12.5 mg Oral BID   pantoprazole  40 mg Oral Daily   polyethylene glycol  17 g Oral BID   senna  1 tablet Oral Daily   Continuous Infusions:  ferric gluconate (FERRLECIT) IVPB 125 mg (11/18/21 1422)     LOS: 24 days    Time spent: 35 minutes    Irine Seal, MD [facility]   To contact the attending provider between 7A-7P or the covering provider during after hours 7P-7A, please log into the web site [email or web address] and access using universal [place] password for that web site. If you do not have the password, please call the hospital operator.  11/25/2021, 12:48 PM

## 2021-11-25 NOTE — Consult Note (Addendum)
Chief Complaint: Malfunctioning dialysis catheter. Request is for HD catheter exchange  Referring Physician(s): Dr. Shelia Media PA  Supervising Physician: Daryll Brod  Patient Status: [facility] - In-pt  History of Present Illness: Courtney Gray is a 51 y.o. female  Inpatient. Well-known to IR service.  History of morbid obesity, gout, hypertension, hyperlipidemia, diabetes, chronic hypoxic respiratory failure, critical limb ischemia status post right AKA, end-stage renal disease on hemodialysis.  For procedure notes from last exchange on October 19, 2021 by Dr. Kathlene Cote.  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  Left IJ was accessed and is per notes the last tenable access in the neck.  Neither subclavian vein is visible by ultrasound due to body habitus.  IR placed a 28 cm permacath at that time.  Per Team catheter is now malfunctioning and they are unable to perform dialysis.  Team requesting a dialysis catheter exchange.  Patient seen with daughter at bedside.  Expressing concern over delay in dialysis treatment.Currently without any significant complaints. Patient alert and laying in bed, calm. Denies any fevers, headache, chest pain, SOB, cough, abdominal pain, nausea, vomiting or bleeding.  Return precautions and treatment recommendations and follow-up discussed with the patient and her daughter both who are agreeable with the plan.   Past Medical History:  Diagnosis Date   Arthritis    Diabetes mellitus without complication ([place])    Dialysis complication, subsequent encounter    Gout    High cholesterol    Hypertension    Renal disorder     Past Surgical History:  Procedure Laterality Date   ABDOMINAL AORTOGRAM W/LOWER EXTREMITY N/A 08/07/2021   Procedure: ABDOMINAL AORTOGRAM W/LOWER EXTREMITY;  Surgeon: Broadus John, MD;  Location: [place];  Service: Cardiovascular;  Laterality: N/A;   AMPUTATION Right 08/14/2021   Procedure:  AMPUTATION ABOVE KNEE;  Surgeon: Marty Heck, MD;  Location: [place];  Service: Vascular;  Laterality: Right;   AMPUTATION Left 09/23/2021   Procedure: AMPUTATION MIDDLE FINGER;  Surgeon: Sherilyn Cooter, MD;  Location: [place];  Service: Orthopedics;  Laterality: Left;   AMPUTATION Left 10/09/2021   Procedure: LEFT ABOVE KNEE AMPUTATION;  Surgeon: Cherre Robins, MD;  Location: [facility] OR;  Service: Vascular;  Laterality: Left;   AV FISTULA PLACEMENT     x2, unsuccessful.    BUBBLE STUDY  09/27/2021   Procedure: BUBBLE STUDY;  Surgeon: Lelon Perla, MD;  Location: [facility] ENDOSCOPY;  Service: Cardiovascular;;   IR FLUORO GUIDE CV LINE LEFT  12/14/2020   IR FLUORO GUIDE CV LINE LEFT  02/20/2021   IR FLUORO GUIDE CV LINE LEFT  09/11/2021   IR FLUORO GUIDE CV LINE LEFT  10/18/2021   IR FLUORO GUIDE CV LINE RIGHT  08/28/2020   IR FLUORO GUIDE CV LINE RIGHT  05/13/2021   IR FLUORO GUIDE CV LINE RIGHT  05/24/2021   IR PERC TUN PERIT CATH WO PORT S&I /IMAG  09/06/2020   IR PTA VENOUS EXCEPT DIALYSIS CIRCUIT  02/20/2021   IR PTA VENOUS EXCEPT DIALYSIS CIRCUIT  05/13/2021   IR REMOVAL TUN CV CATH W/O FL  05/24/2021   IR REMOVAL TUN CV CATH W/O FL  10/15/2021   IR US GUIDE VASC ACCESS LEFT  09/06/2020   IR US GUIDE VASC ACCESS LEFT  09/11/2021   IR US GUIDE VASC ACCESS LEFT  10/18/2021   IR US GUIDE VASC ACCESS RIGHT  05/24/2021   IR US GUIDE VASC ACCESS RIGHT  09/10/2021   IR VENO/JUGULAR RIGHT  09/10/2021   IR VENOCAVAGRAM SVC  02/20/2021   TEE WITHOUT CARDIOVERSION N/A 09/27/2021   Procedure: TRANSESOPHAGEAL ECHOCARDIOGRAM (TEE);  Surgeon: Lelon Perla, MD;  Location: [facility]OSCOPY;  Service: Cardiovascular;  Laterality: N/A;    Allergies: Benzonatate, Hydralazine, Amoxicillin, Clonidine, Chlorhexidine, Morphine, and Oxycodone  Medications: Prior to Admission medications   Medication Sig Start Date End Date Taking? Authorizing Provider  acetaminophen (TYLENOL) 500 MG tablet Take 1,000 mg by mouth as needed  for moderate pain.   Yes [provider]  Amino Acids-Protein Hydrolys (PRO-STAT RENAL CARE) LIQD Take 30 mLs by mouth 2 (two) times daily. 10/28/21  Yes [provider]  ascorbic acid (VITAMIN C) 500 MG tablet Take 500 mg by mouth daily.   Yes [provider]  atorvastatin (LIPITOR) 10 MG tablet Take 1 tablet (10 mg total) by mouth daily. Patient taking differently: Take 10 mg by mouth at bedtime. 11/28/20  Yes Emokpae, Courage, MD  bisacodyl (FLEET) 10 MG/30ML ENEM Place 10 mg rectally every other day as needed (constipation).   Yes [provider]  calcium carbonate (TUMS SMOOTHIES) 750 MG chewable tablet Chew 1,500 mg by mouth See admin instructions. Take 1500 mg with each meal and 1500 mg with each snack   Yes [provider]  Cholecalciferol 25 MCG (1000 UT) capsule Take 1,000 Units by mouth daily.   Yes [provider]  collagenase (SANTYL) 250 UNIT/GM ointment Apply 1 Application topically daily. Apply to right gluteal fold topically every day shift for wound healing.   Yes [provider]  diphenhydrAMINE (BENADRYL) 25 MG tablet Take 25 mg by mouth 2 (two) times daily as needed for allergies.   Yes [provider]  docusate sodium (COLACE) 100 MG capsule Take 100 mg by mouth 2 (two) times daily.   Yes [provider]  doxycycline (VIBRAMYCIN) 100 MG capsule Take 100 mg by mouth 2 (two) times daily. 10/28/21  Yes [provider]  gabapentin (NEURONTIN) 100 MG capsule Take 1 capsule (100 mg total) by mouth 2 (two) times daily. 08/28/21 11/02/21 Yes Pahwani, Einar Grad, MD  levothyroxine (SYNTHROID) 100 MCG tablet Take 100 mcg by mouth daily.   Yes [provider]  loperamide (IMODIUM A-D) 2 MG tablet Take 4 mg by mouth as needed for diarrhea or loose stools.   Yes [provider]  melatonin 3 MG TABS tablet Take 3 mg by mouth at bedtime. 11/01/21  Yes [provider]  nitroGLYCERIN  (NITROSTAT) 0.4 MG SL tablet Place 1 tablet (0.4 mg total) under the tongue every 5 (five) minutes as needed for chest pain. 05/24/21  Yes [place] Eric J, DO  oxyCODONE (OXY IR/ROXICODONE) 5 MG immediate release tablet Take 1-2 tablets (5-10 mg total) by mouth every 4 (four) hours as needed for moderate pain (pain score 4-6). Patient taking differently: Take 5 mg by mouth every 4 (four) hours as needed for moderate pain (pain score 4-6). 10/21/21  Yes Dessa Phi, DO  OXYGEN Inhale 4 L into the lungs See admin instructions. Oxygen at 4L hr via nasal cannula and will use portable oxygen machine when up in wheelchair. 10/28/21  Yes [provider]  pantoprazole (PROTONIX) 40 MG tablet Take 1 tablet (40 mg total) by mouth daily. 11/29/20  Yes Emokpae, Courage, MD  senna (SENOKOT) 8.6 MG tablet Take 2 tablets by mouth 2 (two) times daily.   Yes [provider]  simethicone (MYLICON) 010 MG chewable tablet  Chew 125 mg by mouth in the morning and at bedtime.   Yes [provider]  tiZANidine (ZANAFLEX) 4 MG capsule Take 4 mg by mouth in the morning and at bedtime.   Yes [provider]     Family History  Problem Relation Age of Onset   Diabetes Mother    Diabetes Father     Social History   Socioeconomic History   Marital status: Single    Spouse name: Not on file   Number of children: Not on file   Years of education: Not on file   Highest education level: Not on file  Occupational History   Not on file  Tobacco Use   Smoking status: Never   Smokeless tobacco: Never  Vaping Use   Vaping Use: Never used  Substance and Sexual Activity   Alcohol use: Never   Drug use: Never   Sexual activity: Not on file  Other Topics Concern   Not on file  Social History Narrative   Not on file   Social Determinants of Health   Financial Resource Strain: Not on file  Food Insecurity: Not on file  Transportation Needs: Not on file  Physical Activity: Not on  file  Stress: Not on file  Social Connections: Not on file     Review of Systems: A 12 point ROS discussed and pertinent positives are indicated in the HPI above.  All other systems are negative.  Review of Systems  Constitutional:  Negative for fatigue and fever.  HENT:  Negative for congestion.   Respiratory:  Negative for cough and shortness of breath.   Gastrointestinal:  Negative for abdominal pain, diarrhea, nausea and vomiting.    Vital Signs: BP 136/64   Pulse 77   Temp 98.7 F (37.1 C) (Oral)   Resp 19   Ht 5\' 5"  (1.651 m)   Wt 269 lb (122 kg)   SpO2 95%   BMI 44.76 kg/m     Physical Exam Vitals and nursing note reviewed.  Constitutional:      Appearance: She is well-developed. She is obese.  HENT:     Head: Normocephalic and atraumatic.  Eyes:     Conjunctiva/sclera: Conjunctivae normal.  Cardiovascular:     Rate and Rhythm: Normal rate and regular rhythm.  Pulmonary:     Effort: Pulmonary effort is normal.     Comments: On chronic O2. Diminshed breath sounds throughout Musculoskeletal:     Cervical back: Normal range of motion.     Comments: Right AKA  Skin:    General: Skin is warm.  Neurological:     Mental Status: She is alert and oriented to person, place, and time.     Imaging: CT ABDOMEN PELVIS W CONTRAST  Result Date: 11/13/2021 CLINICAL DATA:  Splenic abscess. EXAM: CT ABDOMEN AND PELVIS WITH CONTRAST TECHNIQUE: Multidetector CT imaging of the abdomen and pelvis was performed using the standard protocol following bolus administration of intravenous contrast. RADIATION DOSE REDUCTION: This exam was performed according to the departmental dose-optimization program which includes automated exposure control, adjustment of the mA and/or kV according to patient size and/or use of iterative reconstruction technique. CONTRAST:  194mL OMNIPAQUE IOHEXOL 300 MG/ML  SOLN COMPARISON:  October 07, 2021. FINDINGS: Lower chest: Moderate size loculated left pleural  effusion is noted with associated subsegmental atelectasis. Hepatobiliary: No gallstones or biliary dilatation is noted. Drainage catheter that was noted adjacent the left hepatic lobe on prior exam has been removed. No definite hepatic  abnormality is noted currently. Pancreas: Unremarkable. No pancreatic ductal dilatation or surrounding inflammatory changes. Spleen: There has been interval placement of percutaneous drainage catheter seen in splenic fluid collection or abscess noted on prior exam. Fluid collection appears to be completely decompressed at this time. Adrenals/Urinary Tract: Adrenal glands appear normal. Severe bilateral renal atrophy is noted consistent with end-stage renal disease. No hydronephrosis or renal obstruction is noted. Urinary bladder is decompressed. Stomach/Bowel: Stomach is within normal limits. Appendix appears normal. No evidence of bowel wall thickening, distention, or inflammatory changes. Vascular/Lymphatic: Aortic atherosclerosis. No enlarged abdominal or pelvic lymph nodes. Reproductive: Uterus and bilateral adnexa are unremarkable. Other: No abdominal wall hernia or abnormality. No abdominopelvic ascites. Musculoskeletal: No acute or significant osseous findings. IMPRESSION: Moderate size loculated left pleural effusion with associated subsegmental atelectasis. Interval placement of percutaneous drainage catheter into splenic fluid collection or abscess. This fluid collection appears to be completely decompressed at this time. Left hepatic drainage catheter noted on prior exam has been removed. Severe bilateral renal atrophy is noted consistent with end-stage renal disease. Aortic Atherosclerosis (ICD10-I70.0). Electronically Signed   By: Marijo Conception M.D.   On: 11/13/2021 08:52   DG Chest 1 View  Result Date: 11/01/2021 CLINICAL DATA:  End-stage renal disease, vascular access issue EXAM: CHEST  1 VIEW COMPARISON:  10/26/2021 FINDINGS: Pulmonary vascular congestion  without frank interstitial edema. Moderate left and possible small right pleural effusion. Lingular and left lower lobe atelectasis. Cardiomegaly. Left IJ dual lumen dialysis catheter terminating at the cavoatrial junction. IMPRESSION: Cardiomegaly with pulmonary vascular congestion. No frank interstitial edema. Moderate left and small right pleural effusions. Electronically Signed   By: Julian Hy M.D.   On: 11/01/2021 17:48   DG Chest Port 1 View  Result Date: 10/26/2021 CLINICAL DATA:  Shortness of breath. EXAM: PORTABLE CHEST 1 VIEW COMPARISON:  10/04/2021 FINDINGS: Left central venous catheter with tip over the cavoatrial junction region. No pneumothorax. Diffuse cardiac enlargement. Mild vascular congestion with perihilar infiltrates suggesting edema. Bilateral pleural effusions, greater on the left. Atelectasis in the left base. Calcification of the aorta. IMPRESSION: Cardiac enlargement with pulmonary vascular congestion, perihilar edema, and effusions, greater on the left. Electronically Signed   By: Lucienne Capers M.D.   On: 10/26/2021 23:48    Labs:  CBC: Recent Labs    11/21/21 0544 11/22/21 0514 11/23/21 0946 11/25/21 0814  WBC 11.4* 10.7* 10.5 13.1*  HGB 8.5* 8.6* 8.9* 8.1*  HCT 27.3* 28.2* 29.0* 26.7*  PLT 209 229 209 254    COAGS: Recent Labs    07/10/21 0627 09/10/21 0403 09/28/21 0151 09/30/21 0500 09/30/21 1049 10/01/21 0847 10/02/21 0621 10/09/21 1219  INR 1.3* 1.3*  --   --   --   --   --  1.3*  APTT  --   --    < > 85* 72* 69* 65*  --    < > = values in this interval not displayed.    BMP: Recent Labs    11/21/21 0544 11/22/21 0514 11/23/21 0946 11/25/21 0814  NA 135 131* 134* 135  K 3.7 4.0 4.0 4.4  CL 97* 96* 98 98  CO2 28 24 25 23   GLUCOSE 96 179* 203* 137*  BUN 27* 38* 23* 47*  CALCIUM 8.7* 8.5* 8.6* 8.6*  CREATININE 4.84* 6.56* 4.68* 8.14*  GFRNONAA 10* 7* 11* 6*    LIVER FUNCTION TESTS: Recent Labs    09/11/21 0324  09/14/21 0636 09/19/21 0915 11/01/21 1740 11/06/21 0403 11/08/21  1443 11/20/21 0906 11/21/21 0544 11/22/21 0514 11/25/21 0814  BILITOT 0.8 0.7  --  1.5* 0.9  --   --   --   --   --   AST 30 16  --  33 28  --   --   --   --   --   ALT 11 10  --  8 7  --   --   --   --   --   ALKPHOS 76 124  --  72 62  --   --   --   --   --   PROT 7.1 6.0*  --  6.6 6.9  --   --   --   --   --   ALBUMIN 2.4* 1.9*  1.9*   < > 2.2* 2.2*   < > 2.2* 2.2* 2.3* 2.3*   < > = values in this interval not displayed.    Assessment and Plan:  51 year old female. Inpatient. Well-known to IR service.  History of morbid obesity, gout, hypertension, hyperlipidemia, diabetes, chronic hypoxic respiratory failure, critical limb ischemia status post right AKA, end-stage renal disease on hemodialysis.  For procedure notes from last exchange on October 19, 2021 by Dr. Kathlene Cote.  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  Left IJ was accessed and is per notes the last tenable access in the neck.  Neither subclavian vein is visible by ultrasound due to body habitus.  IR placed a 28 cm permacath at that time.  Per Team catheter is now malfunctioning and they are unable to perform dialysis.  Team requesting a dialysis catheter exchange.  White blood cell count was 13.1.  BUN, 47, creatinine 8.14, patient is on heparin prophylactic dose.  See list of allergies. I did has right consented him I was wanted like to put a Foley in because pended but a Foley in for the one for today.  IR consulted for possible hemodialysis catheter exchange. Case has been reviewed and procedure approved by Dr. Annamaria Boots.  Patient tentatively scheduled for 8.22.23 with sedation.  Team instructed to: Keep Patient to be NPO after midnight  IR will call patient when ready. Risks and benefits discussed with the patient including, but not limited to bleeding, infection, vascular injury, pneumothorax which may require chest tube  placement, air embolism or even death  All of the patient's questions were answered, patient is agreeable to proceed. Consent signed and in chart.   Thank you for this interesting consult.  I greatly enjoyed meeting Breelyn Icard and look forward to participating in their care.  A copy of this report was sent to the requesting provider on this date.  Electronically Signed: Jacqualine Mau, NP 11/25/2021, 4:29 PM   I spent a total of 40 Minutes    in face to face in clinical consultation, greater than 50% of which was counseling/coordinating care for HD catheter exchange

## 2021-11-25 NOTE — Progress Notes (Signed)
[facility] Progress Note   Subjective:   Seen on HD. Reports feeling well today, denies SOB, CP, dizziness, and nausea.   Objective Vitals:   11/24/21 2146 11/25/21 0547 11/25/21 0816 11/25/21 0820  BP: (!) 150/64 (!) 111/94 (!) 117/48 (!) 118/54  Pulse: 94 80 76 75  Resp: 17 17 16 13   Temp: 98.8 F (37.1 C) 99.1 F (37.3 C) 98.5 F (36.9 C)   TempSrc: Oral Oral    SpO2: 100% 100% 100% 99%  Weight:   122 kg   Height:       Physical Exam General: Obese female, alert and in NAD Heart: RRR, no murmurs, rubs or gallops Lungs: CTA bilaterally without wheezing, rhonchi or rales Abdomen: Soft, non-distended, +BS Extremities: No edema, B/l BKA Dialysis Access: [facility] accessed  Additional Objective Labs: Basic Metabolic Panel: Recent Labs  Lab 11/21/21 0544 11/22/21 0514 11/23/21 0946  NA 135 131* 134*  K 3.7 4.0 4.0  CL 97* 96* 98  CO2 28 24 25   GLUCOSE 96 179* 203*  BUN 27* 38* 23*  CREATININE 4.84* 6.56* 4.68*  CALCIUM 8.7* 8.5* 8.6*  PHOS 5.0* 6.4* 4.6   Liver Function Tests: Recent Labs  Lab 11/20/21 0906 11/21/21 0544 11/22/21 0514  ALBUMIN 2.2* 2.2* 2.3*   No results for input(s): "LIPASE", "AMYLASE" in the last 168 hours. CBC: Recent Labs  Lab 11/18/21 1412 11/20/21 0906 11/21/21 0544 11/22/21 0514 11/23/21 0946  WBC 13.2* 11.9* 11.4* 10.7* 10.5  NEUTROABS  --   --  7.6  --   --   HGB 8.4* 7.8* 8.5* 8.6* 8.9*  HCT 27.7* 25.8* 27.3* 28.2* 29.0*  MCV 96.2 97.7 96.1 96.9 96.7  PLT 208 211 209 229 209   Blood Culture    Component Value Date/Time   SDES BLOOD LEFT ANTECUBITAL 10/16/2021 1534   SPECREQUEST  10/16/2021 1534    BOTTLES DRAWN AEROBIC AND ANAEROBIC Blood Culture results may not be optimal due to an inadequate volume of blood received in culture bottles   CULT  10/16/2021 1534    NO GROWTH 5 DAYS Performed at [facility], [address], [place], [place] [address]    REPTSTATUS 10/21/2021 FINAL 10/16/2021 1534     Cardiac Enzymes: No results for input(s): "CKTOTAL", "CKMB", "CKMBINDEX", "TROPONINI" in the last 168 hours. CBG: Recent Labs  Lab 11/24/21 0737 11/24/21 1140 11/24/21 1642 11/24/21 2147 11/25/21 0734  GLUCAP 97 99 105* 117* 141*   Iron Studies: No results for input(s): "IRON", "TIBC", "TRANSFERRIN", "FERRITIN" in the last 72 hours. @lablastinr3 @ Studies/Results: No results found. Medications:  anticoagulant sodium citrate     ferric gluconate (FERRLECIT) IVPB 125 mg (11/18/21 1422)    atorvastatin  10 mg Oral Daily   darbepoetin (ARANESP) injection - DIALYSIS  100 mcg Intravenous Q Fri-HD   docusate sodium  100 mg Oral BID   feeding supplement  1 Container Oral TID BM   gabapentin  100 mg Oral BID   heparin  5,000 Units Subcutaneous Q12H   insulin aspart  0-5 Units Subcutaneous QHS   insulin aspart  0-6 Units Subcutaneous TID WC   leptospermum manuka honey  1 Application Topical Daily   levothyroxine  100 mcg Oral Q0600   metoprolol tartrate  12.5 mg Oral BID   pantoprazole  40 mg Oral Daily   polyethylene glycol  17 g Oral BID   senna  1 tablet Oral Daily    Dialysis Orders: MWF - [place] ---?  transferring to [facility] 4hrs, BFR 400, DFR 600,  AVF, EDW 146kg, 3K/ 2Ca - Heparin 2500 units with HD - Hectorol 79mcg IV qHD, held as of 10/30/21  Assessment/Plan: ESRD - on HD MWF. Refused last HD. [place] working well today.  Activase locked on Friday.  2. Hypertension/volume - BP variable.  Metoprolol 25mg  BID started 11/04/21, decreased to 12.5mg  BID on 8/17.  Wide variation in recorded bed weights. Continue 3-4L goals as tolerated. 3. Hypokalemia: Requiring intermittent supplementation.  Using 3K bath with HD but 4K is not available. supplement PRN. Last K 4.0 4. Anemia of CKD - Hgb 8s. transferrin too low to calculate tsat. On IV iron weekly for now since she is off IV abx. Aranesp q Friday, increase dose for next week to 115mcg.  5. Secondary Hyperparathyroidism  - Hectorol on hold d/t hypercalcemia.  Checking PTH. Previously taking TUMS as binder but stopped d/t on 8/17. Phos at goal. 6. Nutrition - Renal diet, have added supplements. Alb low. 7. Dispo - Very difficulty situation. Patient required 4 staff members and 2 hoyer pads for transfer into HD chair as OP. Not staying on HD for full treatment d/t pain for multiple decubitus ulcers. This is a major safety concern for staff and patient in the outpatient center. Also concern for inability for ulcers to heal properly d/t frequent and rigorous transfers that are required. Need to determine new placement at a facility where she can receive HD in the bed-more likley a LTAC if possible.   8. Recent septic shock 2/2 bacteremia/liver abscess - Now s/p course of IV cefepime as of 11/08/21 9. Wounds - per PMD/wound care    Anice Paganini, PA-C 11/25/2021, 8:31 AM  [facility] Pager: [phone]

## 2021-11-25 NOTE — Progress Notes (Signed)
Physical Therapy Treatment Patient Details Name: Courtney Gray MRN: [id number] DOB: 08-08-1970 Today's Date: 11/25/2021   History of Present Illness Pt adm 7/28 with acute pulmonary edema. 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    PT Comments    Patient progressing well towards PT goals. Session today focused on core work/activation at bed level as pt wanting to eat lunch before going down to IR to get dialysis catheter fixed and then HD. Worked on there ex of bil residual limbs as well as modified sit ups adjusting bed height using UEs on rails and mod A of 2 with emphasis on activating TA, breathing and slow descent back onto bed. Pt highly motivated to work with therapy. Will plan for OOB next session. Will follow.    Recommendations for follow up therapy are one component of a multi-disciplinary discharge planning process, led by the attending physician.  Recommendations may be updated based on patient status, additional functional criteria and insurance authorization.  Follow Up Recommendations  Acute inpatient rehab (3hours/day) Can patient physically be transported by private vehicle: No   Assistance Recommended at Discharge Frequent or constant Supervision/Assistance  Patient can return home with the following Two people to help with walking and/or transfers;Two people to help with bathing/dressing/bathroom;Assist for transportation;Assistance with cooking/housework;Help with stairs or ramp for entrance   Equipment Recommendations  Wheelchair (measurements PT);Wheelchair cushion (measurements PT);Other (comment);Hospital bed    Recommendations for Other Services       Precautions / Restrictions Precautions Precautions: Fall Precaution Comments: bil AKA, 3L O2 at baseline, LUQ drain Restrictions Weight Bearing Restrictions: Yes RLE Weight Bearing: Non weight bearing LLE Weight  Bearing: Non weight bearing Other Position/Activity Restrictions: minimized pushing/pulling and weight through L hand, dressing now removed     Mobility  Bed Mobility         Supine to sit: Mod assist, +2 for physical assistance     General bed mobility comments: Worked on pulling to long sitting x10 using rails and BUEs for support, some discomfort on ischium but tolerable, Mod A of 2    Transfers                   General transfer comment: seen at bed level as pt wants to eat lunch before going down to IR and then HD.    Ambulation/Gait                   Stairs             Wheelchair Mobility    Modified Rankin (Stroke Patients Only)       Balance                                            Cognition Arousal/Alertness: Awake/alert Behavior During Therapy: WFL for tasks assessed/performed Overall Cognitive Status: Within Functional Limits for tasks assessed                                          Exercises Other Exercises Other Exercises: Bilateral hip flexion x20 BLEs Other Exercises: Hip flexion/abduction- butterfly x20 Other Exercises: Assisted sit ups from bed adjusting bed heights with emphasis on activating  core, breathing and slow descent, Mod A of 2 performed x10    General Comments        Pertinent Vitals/Pain Pain Assessment Pain Assessment: Faces Faces Pain Scale: Hurts little more Pain Location: right ischial pressure wound Pain Descriptors / Indicators: Guarding, Discomfort Pain Intervention(s): Monitored during session, Repositioned    Home Living                          Prior Function            PT Goals (current goals can now be found in the care plan section) Progress towards PT goals: Progressing toward goals    Frequency    Min 3X/week      PT Plan Current plan remains appropriate    Co-evaluation              AM-PAC PT "6 Clicks" Mobility    Outcome Measure  Help needed turning from your back to your side while in a flat bed without using bedrails?: Total Help needed moving from lying on your back to sitting on the side of a flat bed without using bedrails?: Total Help needed moving to and from a bed to a chair (including a wheelchair)?: Total Help needed standing up from a chair using your arms (e.g., wheelchair or bedside chair)?: Total Help needed to walk in hospital room?: Total Help needed climbing 3-5 steps with a railing? : Total 6 Click Score: 6    End of Session Equipment Utilized During Treatment: Oxygen Activity Tolerance: Patient tolerated treatment well Patient left: in bed;with call bell/phone within reach Nurse Communication: Mobility status PT Visit Diagnosis: Other abnormalities of gait and mobility (R26.89);Muscle weakness (generalized) (M62.81) Pain - Right/Left: Right Pain - part of body:  (ischium)     Time: 0037-0488 PT Time Calculation (min) (ACUTE ONLY): 25 min  Charges:  $Therapeutic Exercise: 8-22 mins $Therapeutic Activity: 8-22 mins                     Marisa Severin, PT, DPT Acute Rehabilitation Services Secure chat preferred Office [place] 11/25/2021, 1:41 PM

## 2021-11-26 ENCOUNTER — Inpatient Hospital Stay (HOSPITAL_COMMUNITY): Payer: 59

## 2021-11-26 DIAGNOSIS — I159 Secondary hypertension, unspecified: Secondary | ICD-10-CM | POA: Diagnosis not present

## 2021-11-26 DIAGNOSIS — N186 End stage renal disease: Secondary | ICD-10-CM | POA: Diagnosis not present

## 2021-11-26 DIAGNOSIS — E669 Obesity, unspecified: Secondary | ICD-10-CM | POA: Diagnosis not present

## 2021-11-26 HISTORY — PX: IR FLUORO GUIDE CV LINE LEFT: IMG2282

## 2021-11-26 HISTORY — PX: IR VENOCAVAGRAM SVC: IMG679

## 2021-11-26 LAB — RENAL FUNCTION PANEL
Albumin: 2.3 g/dL — ABNORMAL LOW (ref 3.5–5.0)
Anion gap: 16 — ABNORMAL HIGH (ref 5–15)
BUN: 53 mg/dL — ABNORMAL HIGH (ref 6–20)
CO2: 22 mmol/L (ref 22–32)
Calcium: 8.5 mg/dL — ABNORMAL LOW (ref 8.9–10.3)
Chloride: 98 mmol/L (ref 98–111)
Creatinine, Ser: 8.71 mg/dL — ABNORMAL HIGH (ref 0.44–1.00)
GFR, Estimated: 5 mL/min — ABNORMAL LOW (ref >60)
Glucose, Bld: 117 mg/dL — ABNORMAL HIGH (ref 70–99)
Phosphorus: 8.2 mg/dL — ABNORMAL HIGH (ref 2.5–4.6)
Potassium: 4.7 mmol/L (ref 3.5–5.1)
Sodium: 136 mmol/L (ref 135–145)

## 2021-11-26 LAB — GLUCOSE, CAPILLARY
Glucose-Capillary: 112 mg/dL — ABNORMAL HIGH (ref 70–99)
Glucose-Capillary: 95 mg/dL (ref 70–99)
Glucose-Capillary: 97 mg/dL (ref 70–99)
Glucose-Capillary: 103 mg/dL — ABNORMAL HIGH (ref 70–99)

## 2021-11-26 MED ORDER — LIDOCAINE HCL (PF) 1 % IJ SOLN
INTRAMUSCULAR | Status: AC | PRN
  Administered 2021-11-26: 10 mL

## 2021-11-26 MED ORDER — IOHEXOL 300 MG/ML  SOLN
100.0000 mL | Freq: Once | INTRAMUSCULAR | Status: AC | PRN
  Administered 2021-11-26: 6 mL

## 2021-11-26 MED ORDER — VANCOMYCIN HCL IN DEXTROSE 1-5 GM/200ML-% IV SOLN
INTRAVENOUS | Status: AC
  Administered 2021-11-26: 1000 mg via INTRAVENOUS
  Filled 2021-11-26: qty 200

## 2021-11-26 MED ORDER — LIDOCAINE HCL 1 % IJ SOLN
INTRAMUSCULAR | Status: AC
  Filled 2021-11-26: qty 20

## 2021-11-26 MED ORDER — ANTICOAGULANT SODIUM CITRATE 4% (200MG/5ML) IV SOLN: 5.0000 mL | Status: DC | PRN

## 2021-11-26 MED ORDER — HEPARIN SODIUM (PORCINE) 1000 UNIT/ML IJ SOLN
INTRAMUSCULAR | Status: AC
  Administered 2021-11-26: 4.2 mL
  Filled 2021-11-26: qty 10

## 2021-11-26 MED ORDER — SEVELAMER CARBONATE 800 MG PO TABS
800.0000 mg | ORAL_TABLET | Freq: Three times a day (TID) | ORAL | Status: DC
  Filled 2021-11-26 (×2): qty 1

## 2021-11-26 MED ORDER — ALTEPLASE 2 MG IJ SOLR: 2.0000 mg | Freq: Once | INTRAMUSCULAR | Status: DC | PRN

## 2021-11-26 MED ORDER — DEXTROSE 5 % IV SOLN: INTRAVENOUS | Status: DC

## 2021-11-26 MED ORDER — GELATIN ABSORBABLE 12-7 MM EX MISC
CUTANEOUS | Status: AC
  Filled 2021-11-26: qty 1

## 2021-11-26 MED ORDER — MIDAZOLAM HCL 2 MG/2ML IJ SOLN
INTRAMUSCULAR | Status: AC
  Filled 2021-11-26: qty 2

## 2021-11-26 MED ORDER — FENTANYL CITRATE (PF) 100 MCG/2ML IJ SOLN
INTRAMUSCULAR | Status: AC | PRN
  Administered 2021-11-26: 25 ug via INTRAVENOUS

## 2021-11-26 MED ORDER — FENTANYL CITRATE (PF) 100 MCG/2ML IJ SOLN
INTRAMUSCULAR | Status: AC
  Filled 2021-11-26: qty 2

## 2021-11-26 NOTE — Progress Notes (Signed)
[facility] Progress Note   Subjective:   Planned for catheter exchange today. Pt is on O2 via [place] but denies SOB, CP, dizziness, abdominal pain and nausea.   Objective Vitals:   11/25/21 1641 11/25/21 2128 11/26/21 0632 11/26/21 0817  BP: (!) 120/37 (!) 136/45 139/72 (!) 132/47  Pulse: 75 79 74 73  Resp: 17 19 16 17   Temp: 99.1 F (37.3 C) 99.2 F (37.3 C) 98.6 F (37 C) 99 F (37.2 C)  TempSrc: Oral Oral Oral Oral  SpO2: 99% 100% 99% 97%  Weight:      Height:       Physical Exam General: Obese female, alert and in NAD Heart: RRR, no murmurs, rubs or gallops Lungs: CTA bilaterally without wheezing, rhonchi or rales Abdomen: Soft, non-distended, +BS Extremities: No edema, B/l BKA Dialysis Access: [facility]  Additional Objective Labs: Basic Metabolic Panel: Recent Labs  Lab 11/23/21 0946 11/25/21 0814 11/26/21 0457  NA 134* 135 136  K 4.0 4.4 4.7  CL 98 98 98  CO2 25 23 22   GLUCOSE 203* 137* 117*  BUN 23* 47* 53*  CREATININE 4.68* 8.14* 8.71*  CALCIUM 8.6* 8.6* 8.5*  PHOS 4.6 8.3* 8.2*   Liver Function Tests: Recent Labs  Lab 11/22/21 0514 11/25/21 0814 11/26/21 0457  ALBUMIN 2.3* 2.3* 2.3*   No results for input(s): "LIPASE", "AMYLASE" in the last 168 hours. CBC: Recent Labs  Lab 11/20/21 0906 11/21/21 0544 11/22/21 0514 11/23/21 0946 11/25/21 0814  WBC 11.9* 11.4* 10.7* 10.5 13.1*  NEUTROABS  --  7.6  --   --   --   HGB 7.8* 8.5* 8.6* 8.9* 8.1*  HCT 25.8* 27.3* 28.2* 29.0* 26.7*  MCV 97.7 96.1 96.9 96.7 98.9  PLT 211 209 229 209 254   Blood Culture    Component Value Date/Time   SDES BLOOD LEFT ANTECUBITAL 10/16/2021 1534   SPECREQUEST  10/16/2021 1534    BOTTLES DRAWN AEROBIC AND ANAEROBIC Blood Culture results may not be optimal due to an inadequate volume of blood received in culture bottles   CULT  10/16/2021 1534    NO GROWTH 5 DAYS Performed at [facility], [place] [address], [place], [place] [address]    REPTSTATUS  10/21/2021 FINAL 10/16/2021 1534    Cardiac Enzymes: No results for input(s): "CKTOTAL", "CKMB", "CKMBINDEX", "TROPONINI" in the last 168 hours. CBG: Recent Labs  Lab 11/25/21 0734 11/25/21 1134 11/25/21 1637 11/25/21 2129 11/26/21 0726  GLUCAP 141* 103* 214* 127* 112*   Iron Studies: No results for input(s): "IRON", "TIBC", "TRANSFERRIN", "FERRITIN" in the last 72 hours. @lablastinr3 @ Studies/Results: No results found. Medications:  ferric gluconate (FERRLECIT) IVPB 125 mg (11/18/21 1422)   vancomycin      atorvastatin  10 mg Oral Daily   darbepoetin (ARANESP) injection - DIALYSIS  100 mcg Intravenous Q Fri-HD   docusate sodium  100 mg Oral BID   feeding supplement  1 Container Oral TID BM   gabapentin  100 mg Oral BID   heparin  5,000 Units Subcutaneous Q12H   insulin aspart  0-5 Units Subcutaneous QHS   insulin aspart  0-6 Units Subcutaneous TID WC   leptospermum manuka honey  1 Application Topical Daily   levothyroxine  100 mcg Oral Q0600   metoprolol tartrate  12.5 mg Oral BID   pantoprazole  40 mg Oral Daily   polyethylene glycol  17 g Oral BID   senna  1 tablet Oral Daily    Dialysis Orders:  MWF - [facility] ---?transferring to [facility] 4hrs, BFR 400, DFR 600,  AVF, EDW 146kg, 3K/ 2Ca - Heparin 2500 units with HD - Hectorol 23mcg IV qHD, held as of 10/30/21  Assessment/Plan: ESRD - on HD MWF. Abbreviated treatment yesterday due to [facility] malfunction. IR consulted for [facility] exchange, will plan for brief HD today after exchange, then resume MWF schedule tomorrow.  2. Hypertension/volume - BP variable.  Metoprolol 25mg  BID started 11/04/21, decreased to 12.5mg  BID on 8/17.  Wide variation in recorded bed weights. Continue 3-4L goals as tolerated. 3. Hypokalemia: Requiring intermittent supplementation.  Using 3K bath with HD but 4K is not available. supplement PRN. Last K 4.7 4. Anemia of CKD - Hgb 8s. transferrin too low to calculate tsat. On IV iron weekly for now  since she is off IV abx. Aranesp q Friday, increase dose for next week to 162mcg.  5. Secondary Hyperparathyroidism - Hectorol on hold d/t hypercalcemia.  Checking PTH. Previously taking TUMS as binder but stopped d/t on 8/17. Phos now elevated, will need a different binder, avoiding calcium based.  Will trial renvela.  6. Nutrition - Renal diet, have added supplements. Alb low. 7. Dispo - Very difficulty situation. Patient required 4 staff members and 2 hoyer pads for transfer into HD chair as OP. Not staying on HD for full treatment d/t pain for multiple decubitus ulcers. This is a major safety concern for staff and patient in the outpatient center. Also concern for inability for ulcers to heal properly d/t frequent and rigorous transfers that are required. Need to determine new placement at a facility where she can receive HD in the bed-more likley a LTAC if possible.   8. Recent septic shock 2/2 bacteremia/liver abscess - Now s/p course of IV cefepime as of 11/08/21 9. Wounds - per PMD/wound care  Anice Paganini, PA-C 11/26/2021, 9:13 AM  [facility] Pager: [phone]

## 2021-11-26 NOTE — Progress Notes (Signed)
Mobility Specialist Criteria Algorithm Info.   11/26/21 1500  Pain Assessment  Pain Assessment 0-10  Pain Score 7  Pain Location R-Buttock  Pain Descriptors / Indicators Discomfort;Grimacing;Moaning  Pain Intervention(s) Limited activity within patient's tolerance  Mobility  Activity Turned to right side;Turned to left side (UE exercises & AROM)  Range of Motion/Exercises Active;All extremities  Level of Assistance Standby assist, set-up cues, supervision of patient - no hands on  Assistive Device None  RLE Weight Bearing NWB  LLE Weight Bearing NWB  Activity Response Tolerated fair   Patient received in bed eager to participate in mobility. Demo x 3 AROM and x1 UE exercise but was limited by significant pain from wound.  Could not tolerate shifting weight onto side with wound so session was terminated. Patient was readjusted and was left with all needs met, call bell in reach.   [place] Loree Shehata, [place], [place]  QMGNO:[phone] Office: [phone]

## 2021-11-26 NOTE — Progress Notes (Signed)
Spoke to [employer] with [employer]. Maudie Mercury states that pt's case is being reviewed by nephrologist to see if they can accept pt for out-pt stretcher HD. Maudie Mercury will f/u with navigator once physician makes determination.   Melven Sartorius Renal Navigator [phone]

## 2021-11-26 NOTE — Procedures (Addendum)
Interventional Radiology Procedure Note  Procedure:   Exchange of left IJ tunneled HD cath.   Findings: Old catheter aspirated fine.  Tip is at the superior cavoatrial jxn.  Limited venogram shows no fibrin sheath.  New 28cm tip to cuff catheter placed, with functional ports.  .  Complications: None Recommendations:  - Ok to use - Do not submerge - Routine line care  - OK to advance diet per primary order  Signed,  Dulcy Fanny. Earleen Newport, DO

## 2021-11-26 NOTE — Progress Notes (Signed)
PROGRESS NOTE    Courtney Gray  WGY:[id number] DOB: 1970-06-20 DOA: 11/01/2021 PCP: Monico Blitz, MD    Chief Complaint  Patient presents with   Vascular Access Problem    Brief Narrative: 51 y.o. female with medical history significant of ESRD on HD TTS, recent bacteremia and hepatic abscess on long-term IV antibiotics, chronic HF PEF, PVD status post bilateral AKA, IIDM, morbid obesity, chronic sacral wound stage II, presented with increasing shortness of breath.   Patient underwent bilateral AKA recently including right AKA in May and left AKA on 7/5.  And patient was recently diagnosed with septic shock secondary to bacteremia and liver abscess, for which patient has been on cefepime and Flagyl and the last dose will be August 4.  Recently, since patient has bilateral AKA, she has had extreme difficulty to get access to outpatient HD center.  She never missed her dialysis however according to outpatient HD records, it takes at least 4 staff members and [facility] lift for patient to be seated in the dialysis chair.  Starting today, patient started to feel shortness of breath, no chest pain no swelling.  No cough no fever or chills. She is now awaiting placement for HD that takes stretcher and is hoping to find one locally.   Assessment & Plan:   Active Problems:   End stage renal disease ([place])   Hypertension   Decubitus ulcer of sacral region, stage 2 (HCC)   Type 2 diabetes mellitus with hyperlipidemia (HCC)   Bacteremia   PVD (peripheral vascular disease) (HCC)   Class 3 obesity (HCC)  1 acute on chronic diastolic CHF with chronic hypoxemic respiratory failure -Secondary to uncontrolled hypertension, patient with signs of mild volume overload on presentation. -Imdur held due to soft/low blood pressure.   -Continue Lopressor to 12.5 twice daily.   -Volume management per HD/nephrology.  2.  Hypertensive emergency -Was on Imdur, metoprolol. -Due to soft blood pressure during HD  Imdur held. -Blood pressure stable on current regimen of Lopressor 12 mg twice daily.   3.  ESRD on HD -On admission admitting physician discussed with ED physician and case manager that patient will likely need long-term dialysis plan as patient currently in outpatient HD center had hard time accommodating patient's dialysis. -Nephrology consulted and managing hemodialysis. -Patient noted during HD treatment to have catheter issues and hemodialysis stopped on 11/25/2021.  IR consulted for hemodialysis catheter exchange which will be done today 11/26/2021.. -TOC following and exploring options. -It is noted that patient recently moved from [place] to be with her daughter in [place] and insistent on finding a place in [place]. -It is noted that patient's daughter does not have a car even to drive patient if she is placed far away. -TOC following and helping with placement.  4.  Anemia of CKD -IV iron weekly per nephrology. -H&H stable.  5.  Constipation -Continue suppository as noted MiraLAX does not work well for patient. -Patient noted to have refused suppository and enema. -Continue bowel regimen of Colace twice daily, MiraLAX twice daily which patient seems to be refusing as well as Senokot-S daily which patient seems to be refusing.  6.  Nausea/vomiting -Resolved. -Tolerating current diet.  7.  Well-controlled insulin-dependent diabetes mellitus -Hemoglobin A1c 5.7 -CBG 95 this morning. -Patient currently n.p.o. in anticipation of dialysis catheter exchange and as such we will place on D5W. -SSI.  8.  Morbid obesity -Lifestyle modification -Outpatient follow-up.  9.  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 -Patient receiving IV cefepime on HD and Flagyl which was completed 8/4. -Dr. Algis Liming  consulted IR to evaluate for abdominal drain that was in place IR seen and drain removed 8/9. -Patient noted to have missed ID clinic appointment on 11/01/2021. -Patient seen by ID no indication for further antibiotics at this time, IR to remove drain per ID, ID has since signed off. -Abdominal discomfort improved post drain removal.  10.  PAD -Status post bilateral AKA for gangrene with plan to follow-up with vascular surgery in a month (Dr. Stanford Breed on 11/21/2021 for staple removal) -Patient is status post left middle finger amputation for gangrene during recent admission. -Vascular surgery informed of patient's admission.  11.  Hyperlipidemia -Statin.  12.  Chronic respiratory failure with hypoxia -Patient noted to report uses 4 L/Min home O2 prior to admission. -Currently sats of 100 % on 4 L nasal cannula. -Wean O2.  13. Stage II right ischial wound, POA -Wound care consulted. -Per PT earlier on June the hospitalization, patient with increased drainage from wound and as such WOC reconsulted who has assessed patient and PT seeing patient for hydrotherapy. -Continue current wound care as recommended by [facility] RN. Pressure Injury 09/11/21 Buttocks Right Stage 2 -  Partial thickness loss of dermis presenting as a shallow open injury with a red, pink wound bed without slough. pink; evolved to [place], end dated this wound as to not have dupliate (Active)  09/11/21 0000  Location: Buttocks  Location Orientation: Right  Staging: Stage 2 -  Partial thickness loss of dermis presenting as a shallow open injury with a red, pink wound bed without slough.  Wound Description (Comments): pink; evolved to [place], end dated this wound as to not have dupliate  Present on Admission: Yes     Pressure Injury 11/12/21 Hip Right Unstageable - Full thickness tissue loss in which the  base of the injury is covered by slough (yellow, tan, gray, green or brown) and/or eschar (tan, brown or black) in the wound bed. 90% covered with slough (Active)  11/12/21 0800  Location: Hip  Location Orientation: Right  Staging: Unstageable - Full thickness tissue loss in which the base of the injury is covered by slough (yellow, tan, gray, green or brown) and/or eschar (tan, brown or black) in the wound bed.  Wound Description (Comments): 90% covered with slough  Present on Admission: Yes (per RN)     Pressure Injury 11/24/21 Flank Left;Lower Stage 2 -  Partial thickness loss of dermis presenting as a shallow open injury with a red, pink wound bed without slough. (Active)  11/24/21 0430  Location: Flank  Location Orientation: Left;Lower  Staging: Stage 2 -  Partial thickness loss of dermis presenting as a shallow open injury with a red, pink wound bed without slough.  Wound Description (Comments):   Present on Admission: No       DVT prophylaxis: Heparin Code Status: Full Family Communication: Updated patient, no family at bedside.  Disposition: TBD  Status is: Inpatient Remains inpatient appropriate because: Unsafe disposition.   Consultants:  Nephrology: Dr. Moshe Cipro 11/01/2021 Wound care RN, Grant Ruts 11/03/2021 ID: Dr.[email or web address] 11/13/2021  Procedures:  CT abdomen and pelvis 11/13/2021 Chest x-ray 11/01/2021  Antimicrobials:  Anti-infectives (From admission, onward)  Start     Dose/Rate Route Frequency Ordered Stop   11/26/21 1000  vancomycin (VANCOCIN) IVPB 1000 mg/200 mL premix        1,000 mg 200 mL/hr over 60 Minutes Intravenous On call 11/25/21 1628 11/27/21 1000   11/04/21 1800  ceFEPIme (MAXIPIME) 2 g in sodium chloride 0.9 % 100 mL IVPB        2 g 200 mL/hr over 30 Minutes Intravenous Every M-W-F (1800) 11/03/21 6433 11/08/21 2359   11/04/21 1200  ceFEPIme (MAXIPIME) 2 g in sodium chloride 0.9 % 100 mL IVPB  Status:  Discontinued        2 g 200 mL/hr  over 30 Minutes Intravenous Every M-W-F (Hemodialysis) 11/02/21 0941 11/03/21 0952   11/02/21 1800  ceFEPIme (MAXIPIME) 2 g in sodium chloride 0.9 % 100 mL IVPB        2 g 200 mL/hr over 30 Minutes Intravenous  Once 11/02/21 0941 11/02/21 1812   11/02/21 1200  ceFEPIme (MAXIPIME) 2 g in sodium chloride 0.9 % 100 mL IVPB  Status:  Discontinued        2 g 200 mL/hr over 30 Minutes Intravenous Every T-Th-Sa (Hemodialysis) 11/01/21 1934 11/02/21 0941   11/01/21 2200  metroNIDAZOLE (FLAGYL) tablet 500 mg        500 mg Oral Every 12 hours 11/01/21 1827 11/08/21 2225   11/01/21 1945  ceFEPIme (MAXIPIME) 1 g in sodium chloride 0.9 % 100 mL IVPB        1 g 200 mL/hr over 30 Minutes Intravenous  Once 11/01/21 1933 11/01/21 2032   11/01/21 1730  ceFEPIme (MAXIPIME) 1 g in sodium chloride 0.9 % 100 mL IVPB  Status:  Discontinued        1 g 200 mL/hr over 30 Minutes Intravenous Every 24 hours 11/01/21 1720 11/01/21 1933         Subjective: Laying in bed about to get hydrotherapy.  Currently n.p.o. awaiting HD catheter exchange.  Stated CBG was 95.  Was hoping to be able to eat in between HD catheter placement and dialysis.   Objective: Vitals:   11/25/21 1641 11/25/21 2128 11/26/21 0632 11/26/21 0817  BP: (!) 120/37 (!) 136/45 139/72 (!) 132/47  Pulse: 75 79 74 73  Resp: 17 19 16 17   Temp: 99.1 F (37.3 C) 99.2 F (37.3 C) 98.6 F (37 C) 99 F (37.2 C)  TempSrc: Oral Oral Oral Oral  SpO2: 99% 100% 99% 97%  Weight:      Height:        Intake/Output Summary (Last 24 hours) at 11/26/2021 1215 Last data filed at 11/26/2021 0800 Gross per 24 hour  Intake 771.62 ml  Output 0 ml  Net 771.62 ml    Filed Weights   11/20/21 1424 11/25/21 0816 11/25/21 0908  Weight: 122.5 kg 122 kg 122 kg    Examination:  General exam: NAD. Respiratory system: Lungs to auscultation bilaterally.  No wheezes, no crackles, no rhonchi.  Fair air movement.  Speaking in full sentences.  Cardiovascular  system: RRR no murmurs rubs or gallops.  No JVD.  Gastrointestinal system: Abdomen is soft, nontender, nondistended, positive bowel sounds.  No rebound.  No guarding.    Central nervous system: Alert and oriented.  No focal neurological deficits.   Extremities: Status post B AKA Skin: Large right ischial wound/ulcer with decreased drainage not purulent.  Psychiatry: Judgement and insight appear normal. Mood & affect appropriate.     Data Reviewed: I have personally  reviewed following labs and imaging studies  CBC: Recent Labs  Lab 11/20/21 0906 11/21/21 0544 11/22/21 0514 11/23/21 0946 11/25/21 0814  WBC 11.9* 11.4* 10.7* 10.5 13.1*  NEUTROABS  --  7.6  --   --   --   HGB 7.8* 8.5* 8.6* 8.9* 8.1*  HCT 25.8* 27.3* 28.2* 29.0* 26.7*  MCV 97.7 96.1 96.9 96.7 98.9  PLT 211 209 229 209 254     Basic Metabolic Panel: Recent Labs  Lab 11/21/21 0544 11/22/21 0514 11/23/21 0946 11/25/21 0814 11/26/21 0457  NA 135 131* 134* 135 136  K 3.7 4.0 4.0 4.4 4.7  CL 97* 96* 98 98 98  CO2 28 24 25 23 22   GLUCOSE 96 179* 203* 137* 117*  BUN 27* 38* 23* 47* 53*  CREATININE 4.84* 6.56* 4.68* 8.14* 8.71*  CALCIUM 8.7* 8.5* 8.6* 8.6* 8.5*  PHOS 5.0* 6.4* 4.6 8.3* 8.2*     GFR: Estimated Creatinine Clearance: 10 mL/min (A) (by C-G formula based on SCr of 8.71 mg/dL (H)).  Liver Function Tests: Recent Labs  Lab 11/20/21 0906 11/21/21 0544 11/22/21 0514 11/25/21 0814 11/26/21 0457  ALBUMIN 2.2* 2.2* 2.3* 2.3* 2.3*     CBG: Recent Labs  Lab 11/25/21 1134 11/25/21 1637 11/25/21 2129 11/26/21 0726 11/26/21 1132  GLUCAP 103* 214* 127* 112* 95      No results found for this or any previous visit (from the past 240 hour(s)).       Radiology Studies: No results found.      Scheduled Meds:  atorvastatin  10 mg Oral Daily   darbepoetin (ARANESP) injection - DIALYSIS  100 mcg Intravenous Q Fri-HD   docusate sodium  100 mg Oral BID   feeding supplement  1  Container Oral TID BM   gabapentin  100 mg Oral BID   heparin  5,000 Units Subcutaneous Q12H   insulin aspart  0-5 Units Subcutaneous QHS   insulin aspart  0-6 Units Subcutaneous TID WC   leptospermum manuka honey  1 Application Topical Daily   levothyroxine  100 mcg Oral Q0600   metoprolol tartrate  12.5 mg Oral BID   pantoprazole  40 mg Oral Daily   polyethylene glycol  17 g Oral BID   senna  1 tablet Oral Daily   sevelamer carbonate  800 mg Oral TID WC   Continuous Infusions:  anticoagulant sodium citrate     ferric gluconate (FERRLECIT) IVPB 125 mg (11/18/21 1422)   vancomycin       LOS: 25 days    Time spent: 35 minutes    Irine Seal, MD [facility]   To contact the attending provider between 7A-7P or the covering provider during after hours 7P-7A, please log into the web site [email or web address] and access using universal [place] password for that web site. If you do not have the password, please call the hospital operator.  11/26/2021, 12:15 PM

## 2021-11-26 NOTE — Progress Notes (Signed)
Physical Therapy Wound Treatment Patient Details  Name: Journii Nierman MRN: [id number] Date of Birth: Oct 30, 1970  Today's Date: 11/26/2021 Time: 1137-1222 Time Calculation (min): 45 min  Subjective  Subjective Assessment Subjective: Pt pleasant but with questions regarding her blood sugar. States it is currently 95 but she is worried it will drop during our session. Patient and Family Stated Goals: get this wound healed Date of Onset:  (Unknown) Prior Treatments: unknown  Pain Score:  Pt premedicated and did not complain of pain during session.   Wound Assessment  Pressure Injury 11/12/21 Hip Right Unstageable - Full thickness tissue loss in which the base of the injury is covered by slough (yellow, tan, gray, green or brown) and/or eschar (tan, brown or black) in the wound bed. 90% covered with slough (Active)  Dressing Type Honey;Foam - Lift dressing to assess site every shift;Gauze (Comment);Barrier Film (skin prep) 11/26/21 1514  Dressing Changed;Clean, Dry, Intact 11/26/21 1514  Dressing Change Frequency Twice a day 11/26/21 1514  State of Healing Non-healing 11/26/21 1514  Site / Wound Assessment Pink;Red;Yellow 11/26/21 1514  % Wound base Red or Granulating 75% 11/26/21 1514  % Wound base Yellow/Fibrinous Exudate 25% 11/26/21 1514  % Wound base Black/Eschar 0% 11/26/21 1514  % Wound base Other/Granulation Tissue (Comment) 0% 11/26/21 1514  Peri-wound Assessment Intact 11/26/21 1514  Wound Length (cm) 9 cm 11/23/21 0300  Wound Width (cm) 11 cm 11/22/21 1406  Wound Depth (cm) 4 cm 11/23/21 0300  Wound Surface Area (cm^2) 110 cm^2 11/22/21 1406  Wound Volume (cm^3) 550 cm^3 11/22/21 1406  Margins Unattached edges (unapproximated) 11/26/21 1514  Drainage Amount Moderate 11/26/21 1514  Drainage Description Serosanguineous;Purulent;Odor - foul 11/26/21 1514  Treatment Debridement (Selective);Irrigation;Packing (Dry gauze) 11/26/21 1514      Selective Debridement Selective  Debridement - Location: R ischium Selective Debridement - Tools Used: Forceps, Scalpel, Scissors Selective Debridement - Tissue Removed: Yellow unviable tissue    Wound Assessment and Plan  Wound Therapy - Assess/Plan/Recommendations Wound Therapy - Clinical Statement: Wound bed appears improved but with continued nonviable tissue at the very base of the wound. Will continue to debride as able. Pt with no complaints of pain throughout session. This patient will benefit from continued hydrotherapy for selective removal of unviable tissue, to decrease bioburden, and promote wound bed healing. Wound Therapy - Functional Problem List: commpromised mobility Factors Delaying/Impairing Wound Healing: Diabetes Mellitus, Immobility Hydrotherapy Plan: Debridement, Dressing change, Patient/family education Wound Therapy - Frequency:  (Tues/Friday) Wound Therapy - Follow Up Recommendations: dressing changes by RN  Wound Therapy Goals- Improve the function of patient's integumentary system by progressing the wound(s) through the phases of wound healing (inflammation - proliferation - remodeling) by: Wound Therapy Goals - Improve the function of patient's integumentary system by progressing the wound(s) through the phases of wound healing by: Decrease Necrotic Tissue to: 10% Decrease Necrotic Tissue - Progress: Progressing toward goal Increase Granulation Tissue to: 90% Increase Granulation Tissue - Progress: Progressing toward goal Improve Drainage Characteristics: Min, Serous Improve Drainage Characteristics - Progress: Progressing toward goal Goals/treatment plan/discharge plan were made with and agreed upon by patient/family: Yes Time For Goal Achievement: 7 days Wound Therapy - Potential for Goals: Good  Goals will be updated until maximal potential achieved or discharge criteria met.  Discharge criteria: when goals achieved, discharge from hospital, MD decision/surgical intervention, no progress  towards goals, refusal/missing three consecutive treatments without notification or medical reason.  GP     Charges PT Wound Care Charges $Wound Debridement up  to 20 cm: < or equal to 20 cm $ Wound Debridement each add'l 20 sqcm: 1 $PT Hydrotherapy Dressing: 1 dressing $PT Hydrotherapy Visit: 1 Visit       Thelma Comp 11/26/2021, 3:20 PM  Rolinda Roan, PT, DPT Acute Rehabilitation Services Secure Chat Preferred Office: [phone]

## 2021-11-27 DIAGNOSIS — N186 End stage renal disease: Secondary | ICD-10-CM | POA: Diagnosis not present

## 2021-11-27 LAB — RENAL FUNCTION PANEL
Albumin: 2.2 g/dL — ABNORMAL LOW (ref 3.5–5.0)
Anion gap: 12 (ref 5–15)
BUN: 40 mg/dL — ABNORMAL HIGH (ref 6–20)
CO2: 25 mmol/L (ref 22–32)
Calcium: 8.5 mg/dL — ABNORMAL LOW (ref 8.9–10.3)
Chloride: 97 mmol/L — ABNORMAL LOW (ref 98–111)
Creatinine, Ser: 7 mg/dL — ABNORMAL HIGH (ref 0.44–1.00)
GFR, Estimated: 7 mL/min — ABNORMAL LOW (ref >60)
Glucose, Bld: 133 mg/dL — ABNORMAL HIGH (ref 70–99)
Phosphorus: 7.3 mg/dL — ABNORMAL HIGH (ref 2.5–4.6)
Potassium: 4 mmol/L (ref 3.5–5.1)
Sodium: 134 mmol/L — ABNORMAL LOW (ref 135–145)

## 2021-11-27 LAB — CBC WITH DIFFERENTIAL/PLATELET
Abs Immature Granulocytes: 0.13 10*3/uL — ABNORMAL HIGH (ref 0.00–0.07)
Basophils Absolute: 0.1 10*3/uL (ref 0.0–0.1)
Basophils Relative: 1 %
Eosinophils Absolute: 0.6 10*3/uL — ABNORMAL HIGH (ref 0.0–0.5)
Eosinophils Relative: 4 %
HCT: 26.7 % — ABNORMAL LOW (ref 36.0–46.0)
Hemoglobin: 8.3 g/dL — ABNORMAL LOW (ref 12.0–15.0)
Immature Granulocytes: 1 %
Lymphocytes Relative: 10 %
Lymphs Abs: 1.4 10*3/uL (ref 0.7–4.0)
MCH: 30.2 pg (ref 26.0–34.0)
MCHC: 31.1 g/dL (ref 30.0–36.0)
MCV: 97.1 fL (ref 80.0–100.0)
Monocytes Absolute: 1.6 10*3/uL — ABNORMAL HIGH (ref 0.1–1.0)
Monocytes Relative: 11 %
Neutro Abs: 10.3 10*3/uL — ABNORMAL HIGH (ref 1.7–7.7)
Neutrophils Relative %: 73 %
Platelets: 189 10*3/uL (ref 150–400)
RBC: 2.75 MIL/uL — ABNORMAL LOW (ref 3.87–5.11)
RDW: 16.4 % — ABNORMAL HIGH (ref 11.5–15.5)
WBC: 14 10*3/uL — ABNORMAL HIGH (ref 4.0–10.5)
nRBC: 0 % (ref 0.0–0.2)

## 2021-11-27 LAB — GLUCOSE, CAPILLARY
Glucose-Capillary: 102 mg/dL — ABNORMAL HIGH (ref 70–99)
Glucose-Capillary: 170 mg/dL — ABNORMAL HIGH (ref 70–99)
Glucose-Capillary: 112 mg/dL — ABNORMAL HIGH (ref 70–99)
Glucose-Capillary: 117 mg/dL — ABNORMAL HIGH (ref 70–99)
Glucose-Capillary: 113 mg/dL — ABNORMAL HIGH (ref 70–99)
Glucose-Capillary: 156 mg/dL — ABNORMAL HIGH (ref 70–99)

## 2021-11-27 LAB — HEPATITIS B E ANTIBODY: Hep B E Ab: NEGATIVE

## 2021-11-27 MED ORDER — ALTEPLASE 2 MG IJ SOLR: 2.0000 mg | Freq: Once | INTRAMUSCULAR | Status: DC

## 2021-11-27 MED ORDER — HEPARIN SODIUM (PORCINE) 1000 UNIT/ML IJ SOLN
INTRAMUSCULAR | Status: AC
  Filled 2021-11-27: qty 5

## 2021-11-27 MED ORDER — SEVELAMER CARBONATE 0.8 G PO PACK
0.8000 g | PACK | Freq: Three times a day (TID) | ORAL | Status: DC
  Administered 2021-11-27 – 2021-12-03 (×13): 0.8 g via ORAL
  Filled 2021-11-27 (×19): qty 1

## 2021-11-27 MED ORDER — ALTEPLASE 2 MG IJ SOLR
INTRAMUSCULAR | Status: AC
  Administered 2021-11-27: 4 mg
  Filled 2021-11-27: qty 4

## 2021-11-27 MED ORDER — ALTEPLASE 2 MG IJ SOLR
2.0000 mg | Freq: Once | INTRAMUSCULAR | Status: DC
Start: 2021-11-27 — End: 2021-11-30
  Filled 2021-11-27: qty 2

## 2021-11-27 NOTE — Progress Notes (Signed)
Physical Therapy Treatment Patient Details Name: Courtney Gray MRN: [id number] DOB: 1970-10-15 Today's Date: 11/27/2021   History of Present Illness Pt adm 7/28 with acute pulmonary edema. 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    PT Comments    Patient requesting not to get OOB to chair today due to Rt posterior leg pain (wound pain). Agreeable to exercises and did very well, giving excellent effort. Patient's rolling has improved tremendously since this PT last saw patient. Overall making progress.     Recommendations for follow up therapy are one component of a multi-disciplinary discharge planning process, led by the attending physician.  Recommendations may be updated based on patient status, additional functional criteria and insurance authorization.  Follow Up Recommendations  Acute inpatient rehab (3hours/day) Can patient physically be transported by private vehicle: No   Assistance Recommended at Discharge Frequent or constant Supervision/Assistance  Patient can return home with the following Two people to help with walking and/or transfers;Two people to help with bathing/dressing/bathroom;Assist for transportation;Assistance with cooking/housework;Help with stairs or ramp for entrance   Equipment Recommendations  Wheelchair (measurements PT);Wheelchair cushion (measurements PT);Other (comment);Hospital bed    Recommendations for Other Services       Precautions / Restrictions Precautions Precautions: Fall Precaution Comments: bil AKA, 3L O2 at baseline, LUQ drain Restrictions Weight Bearing Restrictions: Yes RLE Weight Bearing: Non weight bearing LLE Weight Bearing: Non weight bearing Other Position/Activity Restrictions: minimized pushing/pulling and weight through L hand, dressing now removed     Mobility  Bed Mobility Overal bed mobility: Needs Assistance Bed Mobility:  Rolling (scoot to [facility]) Rolling: Min assist (to rt min assist; to left no assist except rail)         General bed mobility comments: scoot to [facility] pt using rails and then head board to pull herself up with max assist    Transfers                   General transfer comment: pt deferred transfer OOB due to Rposterior leg wound pain    Ambulation/Gait                   Stairs             Wheelchair Mobility    Modified Rankin (Stroke Patients Only)       Balance                                            Cognition Arousal/Alertness: Awake/alert Behavior During Therapy: WFL for tasks assessed/performed Overall Cognitive Status: Within Functional Limits for tasks assessed                                          Exercises Amputee Exercises Hip ABduction/ADduction: AROM, Both, Other reps (comment) (50) Hip Flexion/Marching: AROM, Both, Other reps (comment) (50) Other Exercises Other Exercises: cross reach to opposite rail for abdominal strengthening x 10 reps each side    General Comments        Pertinent Vitals/Pain Pain Assessment Pain Assessment: 0-10 Pain Score: 4  Pain Location: R-buttock Pain Descriptors / Indicators: Discomfort, Guarding Pain Intervention(s): Limited activity within patient's tolerance, Monitored during session  Home Living                          Prior Function            PT Goals (current goals can now be found in the care plan section) Acute Rehab PT Goals Patient Stated Goal: ultimately wants to be modified independent at w/c level Time For Goal Achievement: 12/03/21 Potential to Achieve Goals: Fair Progress towards PT goals: Progressing toward goals    Frequency    Min 3X/week      PT Plan Current plan remains appropriate    Co-evaluation              AM-PAC PT "6 Clicks" Mobility   Outcome Measure  Help needed turning from your back to  your side while in a flat bed without using bedrails?: A Lot Help needed moving from lying on your back to sitting on the side of a flat bed without using bedrails?: Total Help needed moving to and from a bed to a chair (including a wheelchair)?: Total Help needed standing up from a chair using your arms (e.g., wheelchair or bedside chair)?: Total Help needed to walk in hospital room?: Total Help needed climbing 3-5 steps with a railing? : Total 6 Click Score: 7    End of Session Equipment Utilized During Treatment: Oxygen Activity Tolerance: Patient tolerated treatment well Patient left: in bed;with call bell/phone within reach   PT Visit Diagnosis: Other abnormalities of gait and mobility (R26.89);Muscle weakness (generalized) (M62.81) Pain - Right/Left: Right Pain - part of body: Leg     Time: 1410-3013 PT Time Calculation (min) (ACUTE ONLY): 41 min  Charges:  $Therapeutic Exercise: 38-52 mins                      Arby Barrette, PT Acute Rehabilitation Services  Office [phone]    [name] 11/27/2021, 11:56 AM

## 2021-11-27 NOTE — Progress Notes (Signed)
[facility] Progress Note   Subjective:   New TDC placed yesterday, completed HD after. She reports her arterial pressure was alarming occasionally. Net UF 2L, pt reported headache and had soft BP. Today, she denies SOB, CP, palpitations, dizziness, abdominal pain and nausea.   Objective Vitals:   11/27/21 0026 11/27/21 0122 11/27/21 0355 11/27/21 0901  BP: (!) 97/54 97/60 (!) 146/60 (!) 120/59  Pulse: 87 82 79 80  Resp: 16 16 16 16   Temp: 98.2 F (36.8 C) 100 F (37.8 C) 98.7 F (37.1 C) 98.3 F (36.8 C)  TempSrc: Oral Oral Oral Oral  SpO2: 96% 98% 99% 95%  Weight: 122.5 kg     Height:       Physical Exam General: Obese female, alert and in NAD Heart: RRR, no murmurs, rubs or gallops Lungs: CTA bilaterally without wheezing, rhonchi or rales Abdomen: Soft, non-distended, +BS Extremities: No edema, B/l BKA Dialysis Access: [facility]  Additional Objective Labs: Basic Metabolic Panel: Recent Labs  Lab 11/23/21 0946 11/25/21 0814 11/26/21 0457  NA 134* 135 136  K 4.0 4.4 4.7  CL 98 98 98  CO2 25 23 22   GLUCOSE 203* 137* 117*  BUN 23* 47* 53*  CREATININE 4.68* 8.14* 8.71*  CALCIUM 8.6* 8.6* 8.5*  PHOS 4.6 8.3* 8.2*   Liver Function Tests: Recent Labs  Lab 11/22/21 0514 11/25/21 0814 11/26/21 0457  ALBUMIN 2.3* 2.3* 2.3*   No results for input(s): "LIPASE", "AMYLASE" in the last 168 hours. CBC: Recent Labs  Lab 11/21/21 0544 11/22/21 0514 11/23/21 0946 11/25/21 0814  WBC 11.4* 10.7* 10.5 13.1*  NEUTROABS 7.6  --   --   --   HGB 8.5* 8.6* 8.9* 8.1*  HCT 27.3* 28.2* 29.0* 26.7*  MCV 96.1 96.9 96.7 98.9  PLT 209 229 209 254   Blood Culture    Component Value Date/Time   SDES BLOOD LEFT ANTECUBITAL 10/16/2021 1534   SPECREQUEST  10/16/2021 1534    BOTTLES DRAWN AEROBIC AND ANAEROBIC Blood Culture results may not be optimal due to an inadequate volume of blood received in culture bottles   CULT  10/16/2021 1534    NO GROWTH 5  DAYS Performed at [facility] [address] [place], [place] [address]    REPTSTATUS 10/21/2021 FINAL 10/16/2021 1534    Cardiac Enzymes: No results for input(s): "CKTOTAL", "CKMB", "CKMBINDEX", "TROPONINI" in the last 168 hours. CBG: Recent Labs  Lab 11/26/21 1733 11/26/21 2008 11/27/21 0125 11/27/21 0401 11/27/21 0738  GLUCAP 97 103* 102* 170* 112*   Iron Studies: No results for input(s): "IRON", "TIBC", "TRANSFERRIN", "FERRITIN" in the last 72 hours. @lablastinr3 @ Studies/Results: IR Fluoro Guide CV Line Left  Result Date: 11/26/2021 INDICATION: 51 year old female referred for exchange of nonfunctional hemodialysis catheter EXAM: IMAGE GUIDED EXCHANGE OF NONFUNCTIONAL LEFT IJ HEMODIALYSIS CATHETER MEDICATIONS: NONE ANESTHESIA/SEDATION: Moderate (conscious) sedation was not employed during this procedure. A total of Versed 0 mg and Fentanyl 50 mcg was administered intravenously. Moderate Sedation Time: 0 minutes. The patient's level of consciousness and vital signs were monitored continuously by radiology nursing throughout the procedure under my direct supervision. FLUOROSCOPY TIME:  Fluoroscopy Time:  (32 mGy). COMPLICATIONS: None PROCEDURE: Informed written consent was obtained from the patient after a discussion of the risks, benefits, and alternatives to treatment. Questions regarding the procedure were encouraged and answered. The left neck and chest were prepped with chlorhexidine in a sterile fashion, and a sterile drape was applied covering the operative field. Maximum barrier  sterile technique with sterile gowns and gloves were used for the procedure. A timeout was performed prior to the initiation of the procedure. Scout images were acquired. The balloon and brown hub of the indwelling catheter were aspirated, confirming excellent aspiration and withdrawal of the heparin dwell. A stiff Glidewire was then manipulated through the blue hub into the IVC. 1% lidocaine was  used for local anesthesia of the tract. Blunt dissection was used to free the cuff within the tract and the catheter was removed on the wire. A new 28 cm tip to cuff catheter was advanced on the stiff Glidewire. Before positioning the tip at the superior cavoatrial junction, the free hub was injected with contrast, to identify the presence of any possible fibrin sheath. No fibrin sheath was identified. Catheter was then advanced into position with the cuff 1 cm above the skin entry site. Wire was removed. Both of the balloon and the brown hub were aspirated vigorously to confirm function. 2.1 cc of heparin dwell placed into each of the lumen. The catheter exit site was secured with a 0-Prolene retention suture. Gel-Foam slurry was infused into the soft tissue tract. Dressings were applied at the chest wall. Patient tolerated the procedure well and remained hemodynamically stable throughout. No complications were encountered and no significant blood loss encountered. FINDINGS: Venogram demonstrates no evidence of fibrin sheath. Tip of the catheter within the superior cavoatrial junction. Aspiration of both ports confirms excellent flow. IMPRESSION: Status post exchange of left IJ tunneled hemodialysis catheter for a new 28 cm palindrome catheter. Signed, Dulcy Fanny. Nadene Rubins, RPVI [facility] Electronically Signed   By: Corrie Mckusick D.O.   On: 11/26/2021 17:33   IR Venocavagram Svc  Result Date: 11/26/2021 INDICATION: 51 year old female referred for exchange of nonfunctional hemodialysis catheter EXAM: IMAGE GUIDED EXCHANGE OF NONFUNCTIONAL LEFT IJ HEMODIALYSIS CATHETER MEDICATIONS: NONE ANESTHESIA/SEDATION: Moderate (conscious) sedation was not employed during this procedure. A total of Versed 0 mg and Fentanyl 50 mcg was administered intravenously. Moderate Sedation Time: 0 minutes. The patient's level of consciousness and vital signs were  monitored continuously by radiology nursing throughout the procedure under my direct supervision. FLUOROSCOPY TIME:  Fluoroscopy Time:  (32 mGy). COMPLICATIONS: None PROCEDURE: Informed written consent was obtained from the patient after a discussion of the risks, benefits, and alternatives to treatment. Questions regarding the procedure were encouraged and answered. The left neck and chest were prepped with chlorhexidine in a sterile fashion, and a sterile drape was applied covering the operative field. Maximum barrier sterile technique with sterile gowns and gloves were used for the procedure. A timeout was performed prior to the initiation of the procedure. Scout images were acquired. The balloon and brown hub of the indwelling catheter were aspirated, confirming excellent aspiration and withdrawal of the heparin dwell. A stiff Glidewire was then manipulated through the blue hub into the IVC. 1% lidocaine was used for local anesthesia of the tract. Blunt dissection was used to free the cuff within the tract and the catheter was removed on the wire. A new 28 cm tip to cuff catheter was advanced on the stiff Glidewire. Before positioning the tip at the superior cavoatrial junction, the free hub was injected with contrast, to identify the presence of any possible fibrin sheath. No fibrin sheath was identified. Catheter was then advanced into position with the cuff 1 cm above the skin entry site. Wire was removed. Both of the balloon and the brown hub were aspirated  vigorously to confirm function. 2.1 cc of heparin dwell placed into each of the lumen. The catheter exit site was secured with a 0-Prolene retention suture. Gel-Foam slurry was infused into the soft tissue tract. Dressings were applied at the chest wall. Patient tolerated the procedure well and remained hemodynamically stable throughout. No complications were encountered and no significant blood loss encountered. FINDINGS: Venogram demonstrates no  evidence of fibrin sheath. Tip of the catheter within the superior cavoatrial junction. Aspiration of both ports confirms excellent flow. IMPRESSION: Status post exchange of left IJ tunneled hemodialysis catheter for a new 28 cm palindrome catheter. Signed, Dulcy Fanny. Nadene Rubins, RPVI [facility] Inter[facility] Rad[facility] Special[facility] Electronically Signed   By: Corrie Mckusick D.O.   On: 11/26/2021 17:33   Medications:  ferric gluconate (FERRLECIT) IVPB 110 mL/hr at 11/26/21 1706    atorvastatin  10 mg Oral Daily   darbepoetin (ARANESP) injection - DIALYSIS  100 mcg Intravenous Q Fri-HD   docusate sodium  100 mg Oral BID   feeding supplement  1 Container Oral TID BM   gabapentin  100 mg Oral BID   heparin  5,000 Units Subcutaneous Q12H   heparin sodium (porcine)       insulin aspart  0-5 Units Subcutaneous QHS   insulin aspart  0-6 Units Subcutaneous TID WC   leptospermum manuka honey  1 Application Topical Daily   levothyroxine  100 mcg Oral Q0600   metoprolol tartrate  12.5 mg Oral BID   pantoprazole  40 mg Oral Daily   polyethylene glycol  17 g Oral BID   senna  1 tablet Oral Daily   sevelamer carbonate  0.8 g Oral TID WC    Dialysis Orders: MWF - [place] ---?transferring to [facility] 4hrs, BFR 400, DFR 600,  AVF, EDW 146kg, 3K/ 2Ca - Heparin 2500 units with HD - Hectorol 36mcg IV qHD, held as of 10/30/21  Assessment/Plan: ESRD - on HD MWF. Abbreviated treatment Monday due to [facility] malfunction. Burchinal exchanged 8/22. Will resume heparin with HD.  2. Hypertension/volume - BP variable.  Metoprolol 25mg  BID started 11/04/21, decreased to 12.5mg  BID on 8/17.  Wide variation in recorded bed weights. Did not tolerate full UF goal yesterday, goal reduced today.  3. Hypokalemia: Requiring intermittent supplementation.  Using 3K bath with HD but 4K is not available. supplement PRN. Last K 4.7 4. Anemia of CKD - Hgb 8s. transferrin too low to calculate tsat. On IV  iron weekly for now since she is off IV abx. Aranesp q Friday, increase dose for next week to 11mcg.  5. Secondary Hyperparathyroidism - Hectorol on hold d/t hypercalcemia.  Checking PTH. Previously taking TUMS as binder but stopped d/t on 8/17. Phos now elevated, will need a different binder, avoiding calcium based.  Will trial renvela.  6. Nutrition - Renal diet, have added supplements. Alb low. 7. Dispo - Very difficulty situation. Patient required 4 staff members and 2 hoyer pads for transfer into HD chair as OP. Not staying on HD for full treatment d/t pain for multiple decubitus ulcers. This is a major safety concern for staff and patient in the outpatient center. Also concern for inability for ulcers to heal properly d/t frequent and rigorous transfers that are required. Need to determine new placement at a facility where she can receive HD in the bed-more likley a LTAC if possible.   8. Recent septic shock 2/2 bacteremia/liver abscess - Now s/p course of IV cefepime as of  11/08/21 9. Wounds - per PMD/wound care  Anice Paganini, PA-C 11/27/2021, 11:48 AM  [facility] Pager: [phone]

## 2021-11-27 NOTE — Progress Notes (Addendum)
[facility] PA informed of pt left chest HD catheter with no blood return. Alteplase 2mg  each port given and dwelled for 1 hour. Improved blood return noted with manipulation of head and use of aspirating with a 3 cc syringe vs 10 cc syringe. HD tech connected catheter to HD lines, occlusion and kink alarms noted despite reversing a/v blood lines. HD site w/o notable swelling or redness, pt denies discomfort at catheter site. Will administer a second dose of alteplase and return pt back to floor to allow med to dwell. PA states will consult with radiology

## 2021-11-27 NOTE — Progress Notes (Signed)
Received patient in bed to unit.  Alert and oriented.  Informed consent signed and in chart.   Treatment initiated: 2231 Treatment completed: 0026  Patient tolerated well.  Transported back to the room  Alert, without acute distress.  Hand-off given to patient's nurse.   Access used: catheter Access issues: none  Total UF removed: 2000 Medication(s) given: none Post HD VS: 98.2, 97/54(70), HR-87, RR16, SP02-96 Post HD weight: 122.47kg Pt didn't meat goal of 3L. Complications with cartridge and low BP. Pt asked to hold UF for a little while unit BP came up. Once UF was turned back on pt complained of headache so I ended treatment 19 min early.   [facility]

## 2021-11-27 NOTE — Progress Notes (Signed)
Progress Note Patient: Courtney Gray HBZ:[id number] DOB: 1970-05-23 DOA: 11/01/2021  DOS: the patient was seen and examined on 11/27/2021  Brief hospital course: 51 y.o. female with medical history significant of ESRD on HD TTS, recent bacteremia and hepatic abscess on long-term IV antibiotics, chronic HF PEF, PVD status post bilateral AKA, IIDM, morbid obesity, chronic sacral wound stage II, presented with increasing shortness of breath.   Patient underwent bilateral AKA recently including right AKA in May and left AKA on 7/5.  And patient was recently diagnosed with septic shock secondary to bacteremia and liver abscess, for which patient has been on cefepime and Flagyl and the last dose will be August 4.  Recently, since patient has bilateral AKA, she has had extreme difficulty to get access to outpatient HD center.  She never missed her dialysis however according to outpatient HD records, it takes at least 4 staff members and [facility] lift for patient to be seated in the dialysis chair.  Starting today, patient started to feel shortness of breath, no chest pain no swelling.  No cough no fever or chills. She is now awaiting placement for HD that takes stretcher and is hoping to find one locally. Assessment and Plan: Acute on chronic diastolic CHF with chronic hypoxemic respiratory failure -Secondary to uncontrolled hypertension, patient with signs of mild volume overload on presentation.  Now resolved. -Volume management per HD/nephrology.   Hypertensive emergency -Was on Imdur, metoprolol. -Due to soft blood pressure during HD Imdur held. -Blood pressure stable on current regimen of Lopressor 12 mg twice daily.    ESRD on HD -Nephrology consulted and managing hemodialysis. -Patient noted during HD treatment to have catheter issues and hemodialysis stopped on 11/25/2021.  IR consulted underwent catheter exchange on 8/22. -TOC following and helping with placement.   Anemia of CKD -IV iron weekly  per nephrology. -H&H stable.   Constipation -Continue suppository as noted MiraLAX does not work well for patient. -Patient noted to have refused suppository and enema. -Continue bowel regimen of Colace twice daily, MiraLAX twice daily which patient seems to be refusing as well as Senokot-S daily which patient seems to be refusing.   Nausea/vomiting -Resolved. -Tolerating current diet.   Well-controlled insulin-dependent diabetes mellitus -Hemoglobin A1c 5.7 -SSI.   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 -Patient receiving IV cefepime on HD and Flagyl which was completed 8/4. -Dr. Algis Liming  consulted IR to evaluate for abdominal drain that was in place IR seen and drain removed 8/9. -Patient noted to have missed ID clinic appointment on 11/01/2021. -Patient seen by ID no indication for further antibiotics at this time, IR to remove drain per ID, ID has since signed off. -Abdominal discomfort improved post drain removal.   PAD -Status post bilateral AKA for gangrene with plan to follow-up with vascular surgery in a month (Dr. Stanford Breed on 11/21/2021 for staple removal) -Patient is status post left middle finger amputation for gangrene during recent admission. -Vascular surgery informed of patient's admission.   Hyperlipidemia -Statin.   Chronic respiratory failure with hypoxia -Patient noted to report uses 4 L/Min home O2 prior to admission. -Currently sats of 100 % on 4 L nasal cannula. -Wean O2.  Morbid obesity. Body mass index is 48.06 kg/m.  Placing the pt at higher risk of poor outcomes.  Right hip unstageable pressure injury. Left flank stage II pressure  ulcer. Both present on admission. Continue wound care recommendation.  LALM and [facility]ih[facility].  Subjective: Denies any acute complaint of nausea or vomiting.  Concerned that she drank a lot of liquids over the weekend and her weight still has not gone up this morning.  No chest pain no abdominal pain.  Physical Exam: Vitals:   11/27/21 1328 11/27/21 1334 11/27/21 1503 11/27/21 1615  BP:  (!) 117/56 124/67 135/60  Pulse:  78 79 82  Resp:  20 20 17   Temp:  98.9 F (37.2 C)    TempSrc:  Oral    SpO2:  100% 100% 95%  Weight: 131 kg     Height:       General: Appear in mild distress; no visible Abnormal Neck Mass Or lumps, Conjunctiva normal Cardiovascular: S1 and S2 Present, aortic systolic  Murmur, Respiratory: good respiratory effort, Bilateral Air entry present and faint Crackles, no wheezes Abdomen: Bowel Sound present, Non tender  Extremities: trace Pedal edema Neurology: alert and oriented to time, place, and person  Gait not checked due to patient safety concerns   Data Reviewed: I have Reviewed nursing notes, Vitals, and Lab results since pt's last encounter. Pertinent lab results CBC and RFP I have ordered test including CBC and RFP    Family Communication: No one at bedside  Disposition: Status is: Inpatient Remains inpatient appropriate because: Unsafe to discharge home, currently awaiting placement.  Author: Berle Mull, MD 11/27/2021 8:20 PM  Please look on [email or web address] to find out who is on call.

## 2021-11-27 NOTE — Progress Notes (Signed)
Mobility Specialist Criteria Algorithm Info.   11/27/21 1523  Mobility  Activity Off unit   Patient off the unit. Will f/u as time permits  Martin[place] Kodee Drury, [place], [place]  LMBEM:[phone] Office: [phone]

## 2021-11-27 NOTE — Progress Notes (Signed)
OT Cancellation Note  Patient Details Name: Courtney Gray MRN: [id number] DOB: July 26, 1970   Cancelled Treatment:    Reason Eval/Treat Not Completed: Patient at procedure or test/ unavailable (patient off unit at HD) Lodema Hong, [place]  Office [place] 11/27/2021, 2:31 PM

## 2021-11-28 ENCOUNTER — Inpatient Hospital Stay (HOSPITAL_COMMUNITY): Payer: 59

## 2021-11-28 DIAGNOSIS — N186 End stage renal disease: Secondary | ICD-10-CM | POA: Diagnosis not present

## 2021-11-28 HISTORY — PX: IR FLUORO GUIDE CV LINE LEFT: IMG2282

## 2021-11-28 HISTORY — PX: IR PTA VENOUS EXCEPT DIALYSIS CIRCUIT: IMG6126

## 2021-11-28 LAB — RENAL FUNCTION PANEL
Albumin: 2.4 g/dL — ABNORMAL LOW (ref 3.5–5.0)
Anion gap: 16 — ABNORMAL HIGH (ref 5–15)
BUN: 47 mg/dL — ABNORMAL HIGH (ref 6–20)
CO2: 20 mmol/L — ABNORMAL LOW (ref 22–32)
Calcium: 8.8 mg/dL — ABNORMAL LOW (ref 8.9–10.3)
Chloride: 96 mmol/L — ABNORMAL LOW (ref 98–111)
Creatinine, Ser: 7.68 mg/dL — ABNORMAL HIGH (ref 0.44–1.00)
GFR, Estimated: 6 mL/min — ABNORMAL LOW (ref >60)
Glucose, Bld: 95 mg/dL (ref 70–99)
Phosphorus: 8 mg/dL — ABNORMAL HIGH (ref 2.5–4.6)
Potassium: 4.9 mmol/L (ref 3.5–5.1)
Sodium: 132 mmol/L — ABNORMAL LOW (ref 135–145)

## 2021-11-28 LAB — CBC WITH DIFFERENTIAL/PLATELET
Abs Immature Granulocytes: 0.18 10*3/uL — ABNORMAL HIGH (ref 0.00–0.07)
Basophils Absolute: 0.1 10*3/uL (ref 0.0–0.1)
Basophils Relative: 1 %
Eosinophils Absolute: 0.6 10*3/uL — ABNORMAL HIGH (ref 0.0–0.5)
Eosinophils Relative: 4 %
HCT: 29.1 % — ABNORMAL LOW (ref 36.0–46.0)
Hemoglobin: 9.2 g/dL — ABNORMAL LOW (ref 12.0–15.0)
Immature Granulocytes: 1 %
Lymphocytes Relative: 10 %
Lymphs Abs: 1.5 10*3/uL (ref 0.7–4.0)
MCH: 30 pg (ref 26.0–34.0)
MCHC: 31.6 g/dL (ref 30.0–36.0)
MCV: 94.8 fL (ref 80.0–100.0)
Monocytes Absolute: 1.8 10*3/uL — ABNORMAL HIGH (ref 0.1–1.0)
Monocytes Relative: 12 %
Neutro Abs: 10.2 10*3/uL — ABNORMAL HIGH (ref 1.7–7.7)
Neutrophils Relative %: 72 %
Platelets: 233 10*3/uL (ref 150–400)
RBC: 3.07 MIL/uL — ABNORMAL LOW (ref 3.87–5.11)
RDW: 16.3 % — ABNORMAL HIGH (ref 11.5–15.5)
WBC: 14.3 10*3/uL — ABNORMAL HIGH (ref 4.0–10.5)
nRBC: 0 % (ref 0.0–0.2)

## 2021-11-28 LAB — GLUCOSE, CAPILLARY
Glucose-Capillary: 104 mg/dL — ABNORMAL HIGH (ref 70–99)
Glucose-Capillary: 105 mg/dL — ABNORMAL HIGH (ref 70–99)
Glucose-Capillary: 162 mg/dL — ABNORMAL HIGH (ref 70–99)
Glucose-Capillary: 90 mg/dL (ref 70–99)
Glucose-Capillary: 96 mg/dL (ref 70–99)

## 2021-11-28 LAB — MAGNESIUM: Magnesium: 1.6 mg/dL — ABNORMAL LOW (ref 1.7–2.4)

## 2021-11-28 MED ORDER — ONDANSETRON HCL 4 MG/2ML IJ SOLN: 4.0000 mg | Freq: Four times a day (QID) | INTRAMUSCULAR | Status: DC | PRN

## 2021-11-28 MED ORDER — VANCOMYCIN HCL IN DEXTROSE 1-5 GM/200ML-% IV SOLN: 1000.0000 mg | Freq: Once | INTRAVENOUS | Status: AC

## 2021-11-28 MED ORDER — CHLORHEXIDINE GLUCONATE 4 % EX LIQD
CUTANEOUS | Status: AC
  Filled 2021-11-28: qty 15

## 2021-11-28 MED ORDER — PROCHLORPERAZINE EDISYLATE 10 MG/2ML IJ SOLN: 10.0000 mg | Freq: Four times a day (QID) | INTRAMUSCULAR | Status: DC | PRN

## 2021-11-28 MED ORDER — LIDOCAINE HCL 1 % IJ SOLN
INTRAMUSCULAR | Status: AC
  Filled 2021-11-28: qty 20

## 2021-11-28 MED ORDER — HEPARIN SODIUM (PORCINE) 1000 UNIT/ML IJ SOLN
INTRAMUSCULAR | Status: AC
  Filled 2021-11-28: qty 3

## 2021-11-28 MED ORDER — IOHEXOL 300 MG/ML  SOLN
100.0000 mL | Freq: Once | INTRAMUSCULAR | Status: AC | PRN
  Administered 2021-11-28: 15 mL via INTRAVENOUS

## 2021-11-28 MED ORDER — ONDANSETRON 4 MG PO TBDP
4.0000 mg | ORAL_TABLET | Freq: Four times a day (QID) | ORAL | Status: DC | PRN
  Filled 2021-11-28: qty 1

## 2021-11-28 MED ORDER — ACETAMINOPHEN 325 MG PO TABS
650.0000 mg | ORAL_TABLET | Freq: Four times a day (QID) | ORAL | Status: DC | PRN
  Administered 2022-01-06 – 2022-05-15 (×24): 650 mg via ORAL
  Filled 2021-11-28 (×31): qty 2

## 2021-11-28 MED ORDER — GABAPENTIN 100 MG PO CAPS
200.0000 mg | ORAL_CAPSULE | Freq: Every day | ORAL | Status: DC
  Administered 2021-11-29 – 2022-04-02 (×123): 200 mg via ORAL
  Filled 2021-11-28 (×125): qty 2

## 2021-11-28 MED ORDER — HEPARIN SODIUM (PORCINE) 1000 UNIT/ML IJ SOLN
INTRAMUSCULAR | Status: AC
  Filled 2021-11-28: qty 10

## 2021-11-28 MED ORDER — FENTANYL CITRATE (PF) 100 MCG/2ML IJ SOLN
INTRAMUSCULAR | Status: AC
  Filled 2021-11-28: qty 2

## 2021-11-28 MED ORDER — VANCOMYCIN HCL IN DEXTROSE 1-5 GM/200ML-% IV SOLN
INTRAVENOUS | Status: AC
  Administered 2021-11-28: 1000 mg via INTRAVENOUS
  Filled 2021-11-28: qty 200

## 2021-11-28 MED ORDER — FENTANYL CITRATE (PF) 100 MCG/2ML IJ SOLN
INTRAMUSCULAR | Status: AC | PRN
  Administered 2021-11-28 (×2): 25 ug via INTRAVENOUS

## 2021-11-28 MED ORDER — SENNOSIDES-DOCUSATE SODIUM 8.6-50 MG PO TABS
2.0000 | ORAL_TABLET | Freq: Two times a day (BID) | ORAL | Status: DC
  Administered 2021-11-28: 2 via ORAL
  Filled 2021-11-28: qty 2

## 2021-11-28 MED ORDER — GABAPENTIN 100 MG PO CAPS
100.0000 mg | ORAL_CAPSULE | Freq: Every day | ORAL | Status: DC
  Administered 2021-11-29 – 2022-04-08 (×120): 100 mg via ORAL
  Filled 2021-11-28 (×120): qty 1

## 2021-11-28 MED ORDER — GELATIN ABSORBABLE 12-7 MM EX MISC
CUTANEOUS | Status: AC
  Filled 2021-11-28: qty 1

## 2021-11-28 MED ORDER — SENNOSIDES-DOCUSATE SODIUM 8.6-50 MG PO TABS
1.0000 | ORAL_TABLET | Freq: Two times a day (BID) | ORAL | Status: DC
  Administered 2021-11-29: 1 via ORAL
  Filled 2021-11-28 (×2): qty 1

## 2021-11-28 NOTE — Procedures (Signed)
Interventional Radiology Procedure Note  Hx: Recurrent malfunction of left IJ tunneled HD catheter.  Right IJ is occluded.   Procedure:   Cavogram.   PTA of chronic fibrin sheath left brachiocephalic/SVC. No stenosis identified.  Replacement with new 23cm tip to cuff catheter.  .  Complications: None  Recommendations:  - Ok to use - Do not submerge - Routine line care   Signed,  Dulcy Fanny. Earleen Newport, DO

## 2021-11-28 NOTE — Progress Notes (Signed)
Inpatient Rehabilitation Admissions Coordinator   Patient from [facility]. Not a candidate for AIR level rehab at this time. Has been bed bound per notes 09/11/21.  Danne Baxter, RN, MSN Rehab Admissions Coordinator [phone] 11/28/2021 2:25 PM

## 2021-11-28 NOTE — Progress Notes (Signed)
Progress Note Patient: Courtney Gray BOF:[id number] DOB: 10-31-1970 DOA: 11/01/2021  DOS: the patient was seen and examined on 11/28/2021  Brief hospital course: 51 y.o. female with medical history significant of ESRD on HD TTS, recent bacteremia and hepatic abscess on long-term IV antibiotics, chronic HF PEF, PVD status post bilateral AKA, IIDM, morbid obesity, chronic sacral wound stage II, presented with increasing shortness of breath.   Patient underwent bilateral AKA recently including right AKA in May and left AKA on 7/5.  And patient was recently diagnosed with septic shock secondary to bacteremia and liver abscess, for which patient has been on cefepime and Flagyl and the last dose will be August 4.  Recently, since patient has bilateral AKA, she has had extreme difficulty to get access to outpatient HD center.  She never missed her dialysis however according to outpatient HD records, it takes at least 4 staff members and [facility] lift for patient to be seated in the dialysis chair.  Starting today, patient started to feel shortness of breath, no chest pain no swelling.  No cough no fever or chills. She is now awaiting placement for HD that takes stretcher and is hoping to find one locally.  Assessment and Plan: Acute on chronic diastolic CHF with chronic hypoxemic respiratory failure -Secondary to uncontrolled hypertension, patient with signs of mild volume overload on presentation.  Now resolved. -Volume management per HD/nephrology.   Hypertensive emergency -Was on Imdur, metoprolol. -Due to soft blood pressure during HD Imdur held. -Blood pressure stable on current regimen of Lopressor 12 mg twice daily.    ESRD on HD -Nephrology consulted and managing hemodialysis. -Patient noted during HD treatment to have catheter issues and hemodialysis stopped on 11/25/2021.  IR consulted underwent catheter exchange on 8/22. Required PTA of chronic fibrin sheath of left brachiocephalic and SVC  7/78. -TOC following and helping with placement.   Anemia of CKD -IV iron weekly per nephrology. -H&H stable.   Constipation Resolved.   Nausea/vomiting -Resolved. -Tolerating current diet.   Well-controlled insulin-dependent diabetes mellitus -Hemoglobin A1c 5.7 -SSI.   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 -Patient receiving IV cefepime on HD and Flagyl which was completed 8/4. -Consulted IR to evaluate for abdominal drain that was in place IR seen and drain removed 8/9. -Patient noted to have missed ID clinic appointment on 11/01/2021. -Patient seen by ID no indication for further antibiotics at this time, IR to remove drain per ID, ID has since signed off. -Abdominal discomfort improved post drain removal.   PAD -Status post bilateral AKA for gangrene with plan to follow-up with vascular surgery in a month (Dr. Stanford Breed on 11/21/2021 for staple removal) -Patient is status post left middle finger amputation for gangrene during recent admission. -Vascular surgery informed of patient's admission.   Hyperlipidemia -Statin.   Chronic respiratory failure with hypoxia -Patient noted to report uses 4 L/Min home O2 prior to admission. -Currently sats of 100 % on 4 L nasal cannula. -Wean O2.  Morbid obesity. Body mass index is 48.06 kg/m.  Placing the pt at higher risk of poor outcomes.  Right hip unstageable pressure injury. Left flank stage II pressure ulcer. Both present on admission. Continue wound care recommendation.  LALM and [facility].  Subjective: No  acute complaint.  No nausea no vomiting.  Concerns with regards to diarrhea and bowel regimen.  Phantom pain still  present.  Physical Exam: Vitals:   11/28/21 1303 11/28/21 1306 11/28/21 1419 11/28/21 1641  BP:  (!) 118/40 135/84 (!) 131/49  Pulse: 80  80 86  Resp:   20 18  Temp:    98 F (36.7 C)  TempSrc:      SpO2: 100%   100%  Weight:      Height:       General: Appear in mild distress; no visible Abnormal Neck Mass Or lumps, Conjunctiva normal Cardiovascular: S1 and S2 Present, aortic systolic Murmur, Respiratory: good respiratory effort, Bilateral Air entry present and CTA, no Crackles, no wheezes Abdomen: Bowel Sound present, Non tender Extremities: no Pedal edema Neurology: alert and oriented to Self, Place and time.  Data Reviewed: I have Reviewed nursing notes, Vitals, and Lab results since pt's last encounter. Pertinent lab results CBC and RFP I have ordered test including CBC and RFP    Family Communication: No one at bedside  Disposition: Status is: Inpatient Remains inpatient appropriate because: Currently awaiting placement.  Needs HD for stretcher.  Author: Berle Mull, MD 11/28/2021 7:50 PM  Please look on [email or web address] to find out who is on call.

## 2021-11-28 NOTE — Progress Notes (Signed)
[facility] Progress Note   Subjective:   HD catheter did not run yesterday despite cath flo and repositioning. Have discussed with IR, does not seem like a clotting issues. Catheter flushes well and no fibrin during last exchange. plan to try another [facility] exchange today. Appreciate IR assistance. Pt reports she is frustrated but denies SOB, CP and dizziness.   Objective Vitals:   11/27/21 1503 11/27/21 1615 11/27/21 2044 11/28/21 0413  BP: 124/67 135/60 127/66 116/68  Pulse: 79 82 83 81  Resp: 20 17 18    Temp:   98.6 F (37 C) 98.5 F (36.9 C)  TempSrc:    Oral  SpO2: 100% 95% 100% 100%  Weight:      Height:       Physical Exam General: Obese female, alert and in NAD Heart: RRR, no murmurs, rubs or gallops Lungs: CTA bilaterally without wheezing, rhonchi or rales. On O2 2L via [place] Abdomen: Soft, non-distended, +BS Extremities: No edema, B/l BKA Dialysis Access: [facility]  Additional Objective Labs: Basic Metabolic Panel: Recent Labs  Lab 11/26/21 0457 11/27/21 1452 11/28/21 0421  NA 136 134* 132*  K 4.7 4.0 4.9  CL 98 97* 96*  CO2 22 25 20*  GLUCOSE 117* 133* 95  BUN 53* 40* 47*  CREATININE 8.71* 7.00* 7.68*  CALCIUM 8.5* 8.5* 8.8*  PHOS 8.2* 7.3* 8.0*   Liver Function Tests: Recent Labs  Lab 11/26/21 0457 11/27/21 1452 11/28/21 0421  ALBUMIN 2.3* 2.2* 2.4*   No results for input(s): "LIPASE", "AMYLASE" in the last 168 hours. CBC: Recent Labs  Lab 11/22/21 0514 11/23/21 0946 11/25/21 0814 11/27/21 1452 11/28/21 0421  WBC 10.7* 10.5 13.1* 14.0* 14.3*  NEUTROABS  --   --   --  10.3* 10.2*  HGB 8.6* 8.9* 8.1* 8.3* 9.2*  HCT 28.2* 29.0* 26.7* 26.7* 29.1*  MCV 96.9 96.7 98.9 97.1 94.8  PLT 229 209 254 189 233   Blood Culture    Component Value Date/Time   SDES BLOOD LEFT ANTECUBITAL 10/16/2021 1534   SPECREQUEST  10/16/2021 1534    BOTTLES DRAWN AEROBIC AND ANAEROBIC Blood Culture results may not be optimal due to an inadequate volume of  blood received in culture bottles   CULT  10/16/2021 1534    NO GROWTH 5 DAYS Performed at [facility], [place] [address], [place], [place] [address]    REPTSTATUS 10/21/2021 FINAL 10/16/2021 1534    Cardiac Enzymes: No results for input(s): "CKTOTAL", "CKMB", "CKMBINDEX", "TROPONINI" in the last 168 hours. CBG: Recent Labs  Lab 11/27/21 1655 11/27/21 2101 11/28/21 0025 11/28/21 0423 11/28/21 0728  GLUCAP 113* 156* 104* 105* 90   Iron Studies: No results for input(s): "IRON", "TIBC", "TRANSFERRIN", "FERRITIN" in the last 72 hours. @lablastinr3 @ Studies/Results: IR Fluoro Guide CV Line Left  Result Date: 11/26/2021 INDICATION: 51 year old female referred for exchange of nonfunctional hemodialysis catheter EXAM: IMAGE GUIDED EXCHANGE OF NONFUNCTIONAL LEFT IJ HEMODIALYSIS CATHETER MEDICATIONS: NONE ANESTHESIA/SEDATION: Moderate (conscious) sedation was not employed during this procedure. A total of Versed 0 mg and Fentanyl 50 mcg was administered intravenously. Moderate Sedation Time: 0 minutes. The patient's level of consciousness and vital signs were monitored continuously by radiology nursing throughout the procedure under my direct supervision. FLUOROSCOPY TIME:  Fluoroscopy Time:  (32 mGy). COMPLICATIONS: None PROCEDURE: Informed written consent was obtained from the patient after a discussion of the risks, benefits, and alternatives to treatment. Questions regarding the procedure were encouraged and answered. The left neck and chest were  prepped with chlorhexidine in a sterile fashion, and a sterile drape was applied covering the operative field. Maximum barrier sterile technique with sterile gowns and gloves were used for the procedure. A timeout was performed prior to the initiation of the procedure. Scout images were acquired. The balloon and brown hub of the indwelling catheter were aspirated, confirming excellent aspiration and withdrawal of the heparin dwell. A stiff Glidewire  was then manipulated through the blue hub into the IVC. 1% lidocaine was used for local anesthesia of the tract. Blunt dissection was used to free the cuff within the tract and the catheter was removed on the wire. A new 28 cm tip to cuff catheter was advanced on the stiff Glidewire. Before positioning the tip at the superior cavoatrial junction, the free hub was injected with contrast, to identify the presence of any possible fibrin sheath. No fibrin sheath was identified. Catheter was then advanced into position with the cuff 1 cm above the skin entry site. Wire was removed. Both of the balloon and the brown hub were aspirated vigorously to confirm function. 2.1 cc of heparin dwell placed into each of the lumen. The catheter exit site was secured with a 0-Prolene retention suture. Gel-Foam slurry was infused into the soft tissue tract. Dressings were applied at the chest wall. Patient tolerated the procedure well and remained hemodynamically stable throughout. No complications were encountered and no significant blood loss encountered. FINDINGS: Venogram demonstrates no evidence of fibrin sheath. Tip of the catheter within the superior cavoatrial junction. Aspiration of both ports confirms excellent flow. IMPRESSION: Status post exchange of left IJ tunneled hemodialysis catheter for a new 28 cm palindrome catheter. Signed, Dulcy Fanny. Nadene Rubins, RPVI [facility] Radiology Electronically Signed   By: Corrie Mckusick D.O.   On: 11/26/2021 17:33   IR Venocavagram Svc  Result Date: 11/26/2021 INDICATION: 51 year old female referred for exchange of nonfunctional hemodialysis catheter EXAM: IMAGE GUIDED EXCHANGE OF NONFUNCTIONAL LEFT IJ HEMODIALYSIS CATHETER MEDICATIONS: NONE ANESTHESIA/SEDATION: Moderate (conscious) sedation was not employed during this procedure. A total of Versed 0 mg and Fentanyl 50 mcg was administered intravenously. Moderate Sedation Time: 0  minutes. The patient's level of consciousness and vital signs were monitored continuously by radiology nursing throughout the procedure under my direct supervision. FLUOROSCOPY TIME:  Fluoroscopy Time:  (32 mGy). COMPLICATIONS: None PROCEDURE: Informed written consent was obtained from the patient after a discussion of the risks, benefits, and alternatives to treatment. Questions regarding the procedure were encouraged and answered. The left neck and chest were prepped with chlorhexidine in a sterile fashion, and a sterile drape was applied covering the operative field. Maximum barrier sterile technique with sterile gowns and gloves were used for the procedure. A timeout was performed prior to the initiation of the procedure. Scout images were acquired. The balloon and brown hub of the indwelling catheter were aspirated, confirming excellent aspiration and withdrawal of the heparin dwell. A stiff Glidewire was then manipulated through the blue hub into the IVC. 1% lidocaine was used for local anesthesia of the tract. Blunt dissection was used to free the cuff within the tract and the catheter was removed on the wire. A new 28 cm tip to cuff catheter was advanced on the stiff Glidewire. Before positioning the tip at the superior cavoatrial junction, the free hub was injected with contrast, to identify the presence of any possible fibrin sheath. No fibrin sheath was identified. Catheter was then advanced into position with the cuff 1  cm above the skin entry site. Wire was removed. Both of the balloon and the brown hub were aspirated vigorously to confirm function. 2.1 cc of heparin dwell placed into each of the lumen. The catheter exit site was secured with a 0-Prolene retention suture. Gel-Foam slurry was infused into the soft tissue tract. Dressings were applied at the chest wall. Patient tolerated the procedure well and remained hemodynamically stable throughout. No complications were encountered and no significant  blood loss encountered. FINDINGS: Venogram demonstrates no evidence of fibrin sheath. Tip of the catheter within the superior cavoatrial junction. Aspiration of both ports confirms excellent flow. IMPRESSION: Status post exchange of left IJ tunneled hemodialysis catheter for a new 28 cm palindrome catheter. Signed, Dulcy Fanny. Nadene Rubins, RPVI [facility] Radiology Electronically Signed   By: Corrie Mckusick D.O.   On: 11/26/2021 17:33   Medications:  ferric gluconate (FERRLECIT) IVPB 110 mL/hr at 11/26/21 1706    alteplase  2 mg Intracatheter Once   alteplase  2 mg Intracatheter Once   atorvastatin  10 mg Oral Daily   darbepoetin (ARANESP) injection - DIALYSIS  100 mcg Intravenous Q Fri-HD   feeding supplement  1 Container Oral TID BM   gabapentin  100 mg Oral BID   heparin  5,000 Units Subcutaneous Q12H   insulin aspart  0-5 Units Subcutaneous QHS   insulin aspart  0-6 Units Subcutaneous TID WC   leptospermum manuka honey  1 Application Topical Daily   levothyroxine  100 mcg Oral Q0600   metoprolol tartrate  12.5 mg Oral BID   pantoprazole  40 mg Oral Daily   polyethylene glycol  17 g Oral BID   senna-docusate  2 tablet Oral BID   sevelamer carbonate  0.8 g Oral TID WC    Dialysis Orders: MWF - [facility] ---?transferring to [facility] 4hrs, BFR 400, DFR 600,  AVF, EDW 146kg, 3K/ 2Ca - Heparin 2500 units with HD - Hectorol 82mcg IV qHD, held as of 10/30/21  Assessment/Plan: ESRD - on HD MWF. Abbreviated treatment Monday due to [facility] malfunction. [place] exchanged 8/22 but unfortunately not working again yesterday. IR exchanging TDC today, will plan for HD after. Resume MWF schedule tomorrow.  2. Hypertension/volume - BP variable.  Metoprolol 25mg  BID started 11/04/21, decreased to 12.5mg  BID on 8/17.  Wide variation in recorded bed weights.  3. Hypokalemia: Requiring intermittent supplementation.  Now at goal 4. Anemia of CKD - Hgb 9.2-  improved. transferrin too low to calculate tsat. On IV iron weekly for now since she is off IV abx. Aranesp q Friday, increase dose for next week to 183mcg.  5. Secondary Hyperparathyroidism - Hectorol on hold d/t hypercalcemia.  PTH 35. Previously taking TUMS as binder but stopped d/t on 8/17. Phos now elevated, will need a different binder, avoiding calcium based.  Will trial renvela.  6. Nutrition - Renal diet, have added supplements. Alb low. 7. Dispo - Very difficulty situation. Patient required 4 staff members and 2 hoyer pads for transfer into HD chair as OP. Not staying on HD for full treatment d/t pain for multiple decubitus ulcers. This is a major safety concern for staff and patient in the outpatient center. Also concern for inability for ulcers to heal properly d/t frequent and rigorous transfers that are required. Need to determine new placement at a facility where she can receive HD in the bed-more likley a LTAC if possible.   8. Recent septic shock 2/2 bacteremia/liver  abscess - Now s/p course of IV cefepime as of 11/08/21 9. Wounds - per PMD/wound care  Anice Paganini, PA-C 11/28/2021, 9:14 AM  [facility] Pager: [phone]

## 2021-11-28 NOTE — Progress Notes (Signed)
Patient Status: [name] - In-pt  Assessment and Plan: Patient in need of venous access for dialysis. Patient known to IR from prior catheter exchanges.  Most recent exchanged performed 11/26/21 by Dr. Earleen Newport. Venogram at the time did not show any fibrin or obstruction.  Catheter working well in IR, however upon HD session yesterday was again sluggish.  IR consulted by Nephrology for dialysis catheter exchange.  Case reviewed and approved by Dr. Earleen Newport.   Patient ate breakfast this AM.  Proceed with pain medication only.   Risks and benefits discussed with the patient including, but not limited to bleeding, infection, vascular injury, pneumothorax which may require chest tube placement, air embolism or even death  All of the patient's questions were answered, patient is agreeable to proceed. Consent signed and in chart.  ______________________________________________________________________   History of Present Illness: Courtney Gray is a 51 y.o. female with history of end stage renal disease and history of difficulty with HD access.  Recently seen in IR 11/26/21 for catheter exchange, however now with sluggish flow during dialysis yesterday.  Nephrology requesting re-evaluation of catheter.   Allergies and medications reviewed.   Review of Systems: A 12 point ROS discussed and pertinent positives are indicated in the HPI above.  All other systems are negative.  Review of Systems  Constitutional:  Negative for fatigue and fever.  Respiratory:  Negative for cough and shortness of breath.   Cardiovascular:  Negative for chest pain.  Gastrointestinal:  Negative for abdominal pain.  Psychiatric/Behavioral:  Negative for behavioral problems and confusion.     Vital Signs: BP (!) 103/51 (BP Location: Left Arm)   Pulse 78   Temp 98.4 F (36.9 C)   Resp 18   Ht 5\' 5"  (1.651 m)   Wt 288 lb 12.8 oz (131 kg)   SpO2 100%   BMI 48.06 kg/m   Physical Exam Vitals and nursing note  reviewed.  Constitutional:      Appearance: Normal appearance.  Neck:     Comments: HD catheter in place.  Pulmonary:     Effort: Pulmonary effort is normal.     Breath sounds: Normal breath sounds.  Musculoskeletal:     Cervical back: Normal range of motion and neck supple.  Skin:    General: Skin is warm and dry.  Neurological:     General: No focal deficit present.     Mental Status: She is alert.  Psychiatric:        Mood and Affect: Mood normal.        Behavior: Behavior normal.      Imaging reviewed.   Labs:  COAGS: Recent Labs    07/10/21 0627 09/10/21 0403 09/28/21 0151 09/30/21 0500 09/30/21 1049 10/01/21 0847 10/02/21 0621 10/09/21 1219  INR 1.3* 1.3*  --   --   --   --   --  1.3*  APTT  --   --    < > 85* 72* 69* 65*  --    < > = values in this interval not displayed.    BMP: Recent Labs    11/25/21 0814 11/26/21 0457 11/27/21 1452 11/28/21 0421  NA 135 136 134* 132*  K 4.4 4.7 4.0 4.9  CL 98 98 97* 96*  CO2 23 22 25  20*  GLUCOSE 137* 117* 133* 95  BUN 47* 53* 40* 47*  CALCIUM 8.6* 8.5* 8.5* 8.8*  CREATININE 8.14* 8.71* 7.00* 7.68*  GFRNONAA 6* 5* 7* 6*  Electronically Signed: Docia Barrier, PA 11/28/2021, 11:25 AM   I spent a total of 15 minutes in face to face in clinical consultation, greater than 50% of which was counseling/coordinating care for malfunctioning dialysis catheter.

## 2021-11-28 NOTE — Progress Notes (Signed)
Occupational Therapy Treatment Patient Details Name: Courtney Gray MRN: [id number] DOB: 1971-03-17 Today's Date: 11/28/2021   History of present illness Pt adm 7/28 with acute pulmonary edema. 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   OT comments  Patient received in bed and had complaints of pain and agreed to bed level activities. Patient performed grooming with setup and UE HEP while in bed with HOB raised. Patient assisted with pressure relieve with patient rolling in bed to aide. Acute OT to continue to follow.    Recommendations for follow up therapy are one component of a multi-disciplinary discharge planning process, led by the attending physician.  Recommendations may be updated based on patient status, additional functional criteria and insurance authorization.    Follow Up Recommendations  OT at [facility]    Assistance Recommended at Discharge Intermittent Supervision/Assistance  Patient can return home with the following  Two people to help with walking and/or transfers;Two people to help with bathing/dressing/bathroom;Assistance with cooking/housework;Direct supervision/assist for medications management;Assist for transportation;Help with stairs or ramp for entrance   Equipment Recommendations  Other (comment) (defer to next venue)    Recommendations for Other Services      Precautions / Restrictions Precautions Precautions: Fall Precaution Comments: bil AKA, 3L O2 at baseline, LUQ drain Restrictions Weight Bearing Restrictions: Yes RLE Weight Bearing: Non weight bearing LLE Weight Bearing: Non weight bearing       Mobility Bed Mobility Overal bed mobility: Needs Assistance Bed Mobility: Rolling Rolling: Min assist         General bed mobility comments: rolling to left to assist with pressure relief    Transfers                   General  transfer comment: deffered transfer OOB due to pain     Balance Overall balance assessment: Needs assistance                                         ADL either performed or assessed with clinical judgement   ADL Overall ADL's : Needs assistance/impaired     Grooming: Wash/dry hands;Wash/dry face;Oral care;Brushing hair;Set up;Bed level Grooming Details (indicate cue type and reason): performed with [facility] raised                               General ADL Comments: patient seen at bed level due to pain    Extremity/Trunk Assessment              Vision       Perception     Praxis      Cognition Arousal/Alertness: Awake/alert Behavior During Therapy: WFL for tasks assessed/performed Overall Cognitive Status: Within Functional Limits for tasks assessed                                          Exercises Exercises: General Upper Extremity General Exercises - Upper Extremity Shoulder ABduction: Strengthening, Right, 10 reps, Theraband, Supine Theraband Level (Shoulder Abduction): Level 4 (Blue) Elbow Flexion: Strengthening, Both, 10 reps, Supine, Theraband Theraband Level (Elbow Flexion): Level 4 (Blue) Elbow Extension: Strengthening, Both, 10 reps, Supine,  Theraband Theraband Level (Elbow Extension): Level 4 (Blue)    Shoulder Instructions       General Comments      Pertinent Vitals/ Pain       Pain Assessment Pain Assessment: Faces Faces Pain Scale: Hurts even more Pain Location: R-buttock Pain Descriptors / Indicators: Discomfort, Guarding Pain Intervention(s): Limited activity within patient's tolerance, Monitored during session, Repositioned  Home Living                                          Prior Functioning/Environment              Frequency  Min 2X/week        Progress Toward Goals  OT Goals(current goals can now be found in the care plan section)  Progress towards OT  goals: Progressing toward goals  Acute Rehab OT Goals Patient Stated Goal: get better OT Goal Formulation: With patient Time For Goal Achievement: 12/04/21 Potential to Achieve Goals: Fair ADL Goals Pt Will Perform Grooming: Independently;sitting;bed level Pt Will Perform Lower Body Bathing: with mod assist;with adaptive equipment;sitting/lateral leans Pt Will Perform Lower Body Dressing: with mod assist;with adaptive equipment;sitting/lateral leans Pt Will Transfer to Toilet: with mod assist;with transfer board;bedside commode Pt/caregiver will Perform Home Exercise Program: Increased strength;Both right and left upper extremity;With theraband;Independently Additional ADL Goal #1: Pt will perform bed mobility with supervision in prep for ADLs Additional ADL Goal #2: Pt will tolerate sitting up in wheelchair/recliner for 1.5 hours in order to improve activity tolerance for ADLs/mobility.  Plan Discharge plan remains appropriate;Frequency remains appropriate    Co-evaluation                 AM-PAC OT "6 Clicks" Daily Activity     Outcome Measure   Help from another person eating meals?: A Little Help from another person taking care of personal grooming?: A Little Help from another person toileting, which includes using toliet, bedpan, or urinal?: Total Help from another person bathing (including washing, rinsing, drying)?: A Lot Help from another person to put on and taking off regular upper body clothing?: A Little Help from another person to put on and taking off regular lower body clothing?: A Lot 6 Click Score: 14    End of Session Equipment Utilized During Treatment: Oxygen  OT Visit Diagnosis: Muscle weakness (generalized) (M62.81);History of falling (Z91.81)   Activity Tolerance Patient limited by pain   Patient Left in bed;with call bell/phone within reach   Nurse Communication Mobility status        Time: 1341-1416 OT Time Calculation (min): 35  min  Charges: OT General Charges $OT Visit: 1 Visit OT Treatments $Self Care/Home Management : 8-22 mins $Therapeutic Exercise: 8-22 mins  Lodema Hong, OTA Acute Rehabilitation Services  Office [place] 11/28/2021, 3:24 PM

## 2021-11-28 NOTE — Progress Notes (Signed)
PT Cancellation Note  Patient Details Name: Courtney Gray MRN: [id number] DOB: 1970-10-08   Cancelled Treatment:    Reason Eval/Treat Not Completed: Patient at procedure or test/unavailable  Off the unit.   Arby Barrette, PT Acute Rehabilitation Services  Office [phone]  [name] 11/28/2021, 12:44 PM

## 2021-11-29 ENCOUNTER — Inpatient Hospital Stay (HOSPITAL_COMMUNITY): Payer: 59

## 2021-11-29 DIAGNOSIS — N186 End stage renal disease: Secondary | ICD-10-CM | POA: Diagnosis not present

## 2021-11-29 LAB — CBC WITH DIFFERENTIAL/PLATELET
Abs Immature Granulocytes: 0.17 10*3/uL — ABNORMAL HIGH (ref 0.00–0.07)
Basophils Absolute: 0.1 10*3/uL (ref 0.0–0.1)
Basophils Relative: 0 %
Eosinophils Absolute: 0.6 10*3/uL — ABNORMAL HIGH (ref 0.0–0.5)
Eosinophils Relative: 4 %
HCT: 27.9 % — ABNORMAL LOW (ref 36.0–46.0)
Hemoglobin: 8.8 g/dL — ABNORMAL LOW (ref 12.0–15.0)
Immature Granulocytes: 1 %
Lymphocytes Relative: 9 %
Lymphs Abs: 1.5 10*3/uL (ref 0.7–4.0)
MCH: 30.1 pg (ref 26.0–34.0)
MCHC: 31.5 g/dL (ref 30.0–36.0)
MCV: 95.5 fL (ref 80.0–100.0)
Monocytes Absolute: 1.3 10*3/uL — ABNORMAL HIGH (ref 0.1–1.0)
Monocytes Relative: 8 %
Neutro Abs: 12.8 10*3/uL — ABNORMAL HIGH (ref 1.7–7.7)
Neutrophils Relative %: 78 %
Platelets: 219 10*3/uL (ref 150–400)
RBC: 2.92 MIL/uL — ABNORMAL LOW (ref 3.87–5.11)
RDW: 16 % — ABNORMAL HIGH (ref 11.5–15.5)
WBC: 16.4 10*3/uL — ABNORMAL HIGH (ref 4.0–10.5)
nRBC: 0 % (ref 0.0–0.2)

## 2021-11-29 LAB — GLUCOSE, CAPILLARY
Glucose-Capillary: 79 mg/dL (ref 70–99)
Glucose-Capillary: 220 mg/dL — ABNORMAL HIGH (ref 70–99)
Glucose-Capillary: 88 mg/dL (ref 70–99)
Glucose-Capillary: 134 mg/dL — ABNORMAL HIGH (ref 70–99)
Glucose-Capillary: 167 mg/dL — ABNORMAL HIGH (ref 70–99)

## 2021-11-29 LAB — MAGNESIUM: Magnesium: 1.6 mg/dL — ABNORMAL LOW (ref 1.7–2.4)

## 2021-11-29 MED ORDER — HEPARIN SODIUM (PORCINE) 1000 UNIT/ML DIALYSIS: 1000.0000 [IU] | INTRAMUSCULAR | Status: DC | PRN

## 2021-11-29 MED ORDER — MIDODRINE HCL 5 MG PO TABS
10.0000 mg | ORAL_TABLET | Freq: Two times a day (BID) | ORAL | Status: DC
  Administered 2021-11-29 – 2021-11-30 (×2): 10 mg via ORAL
  Filled 2021-11-29 (×2): qty 2

## 2021-11-29 MED ORDER — ALTEPLASE 2 MG IJ SOLR
2.0000 mg | Freq: Once | INTRAMUSCULAR | Status: AC | PRN
  Administered 2021-11-29: 2 mg
  Filled 2021-11-29: qty 2

## 2021-11-29 MED ORDER — LIDOCAINE HCL (PF) 1 % IJ SOLN: 5.0000 mL | INTRAMUSCULAR | Status: DC | PRN

## 2021-11-29 MED ORDER — PENTAFLUOROPROP-TETRAFLUOROETH EX AERO: 1.0000 | INHALATION_SPRAY | CUTANEOUS | Status: DC | PRN

## 2021-11-29 MED ORDER — DOCUSATE SODIUM 100 MG PO CAPS: 100.0000 mg | ORAL_CAPSULE | Freq: Two times a day (BID) | ORAL | Status: DC | PRN

## 2021-11-29 MED ORDER — HEPARIN SODIUM (PORCINE) 1000 UNIT/ML IJ SOLN
INTRAMUSCULAR | Status: AC
  Filled 2021-11-29: qty 4

## 2021-11-29 MED ORDER — HEPARIN SODIUM (PORCINE) 1000 UNIT/ML DIALYSIS
2500.0000 [IU] | INTRAMUSCULAR | Status: DC | PRN
  Administered 2021-11-29: 2500 [IU] via INTRAVENOUS_CENTRAL
  Filled 2021-11-29: qty 3

## 2021-11-29 MED ORDER — MAGNESIUM SULFATE 2 GM/50ML IV SOLN
2.0000 g | Freq: Once | INTRAVENOUS | Status: AC
  Administered 2021-11-29: 2 g via INTRAVENOUS
  Filled 2021-11-29: qty 50

## 2021-11-29 MED ORDER — LIDOCAINE-PRILOCAINE 2.5-2.5 % EX CREA: 1.0000 | TOPICAL_CREAM | CUTANEOUS | Status: DC | PRN

## 2021-11-29 MED ORDER — ANTICOAGULANT SODIUM CITRATE 4% (200MG/5ML) IV SOLN: 5.0000 mL | Status: DC | PRN

## 2021-11-29 MED ORDER — HEPARIN SODIUM (PORCINE) 1000 UNIT/ML DIALYSIS: 2500.0000 [IU] | INTRAMUSCULAR | Status: DC | PRN

## 2021-11-29 MED ORDER — SODIUM CHLORIDE 0.9 % IV BOLUS: 250.0000 mL | Freq: Once | INTRAVENOUS | Status: DC

## 2021-11-29 NOTE — Progress Notes (Signed)
Physical Therapy Wound Treatment Patient Details  Name: Courtney Gray MRN: [id number] Date of Birth: 1970/09/08  Today's Date: 11/29/2021 Time: 1102-1215 Time Calculation (min): 73 min  Subjective  Subjective Assessment Subjective: Pt reports gastric distress and reports concern that her current medications to loosen her stool are too strong. Notified RN. Patient and Family Stated Goals: get this wound healed Date of Onset:  (Unknown) Prior Treatments: unknown  Pain Score:  intermittent pain reported  Wound Assessment  Pressure Injury 11/12/21 Hip Right Unstageable - Full thickness tissue loss in which the base of the injury is covered by slough (yellow, tan, gray, green or brown) and/or eschar (tan, brown or black) in the wound bed. 90% covered with slough (Active)  Wound Image   11/29/21 1234  Dressing Type Honey;Foam - Lift dressing to assess site every shift;Gauze (Comment);Barrier Film (skin prep) 11/29/21 1234  Dressing Changed;Clean, Dry, Intact 11/29/21 1234  Dressing Change Frequency Twice a day 11/29/21 1234  State of Healing Early/partial granulation 11/29/21 1234  Site / Wound Assessment Pink;Red;Yellow 11/29/21 1234  % Wound base Red or Granulating 75% 11/29/21 1234  % Wound base Yellow/Fibrinous Exudate 25% 11/29/21 1234  % Wound base Black/Eschar 0% 11/29/21 1234  % Wound base Other/Granulation Tissue (Comment) 0% 11/29/21 1234  Peri-wound Assessment Intact 11/29/21 1234  Wound Length (cm) 9 cm 11/29/21 1234  Wound Width (cm) 9 cm 11/29/21 1234  Wound Depth (cm) 5.8 cm 11/29/21 1234  Wound Surface Area (cm^2) 81 cm^2 11/29/21 1234  Wound Volume (cm^3) 469.8 cm^3 11/29/21 1234  Tunneling (cm) 0 11/29/21 1234  Undermining (cm) 3.2 cm at 9:00, 1.4 cm 10:00-1:00 11/29/21 1234  Margins Unattached edges (unapproximated) 11/29/21 1234  Drainage Amount Moderate 11/29/21 1234  Drainage Description Serosanguineous 11/29/21 1234  Treatment Debridement  (Selective);Irrigation;Packing (Dry gauze);Other (Comment) 11/29/21 1234   Selective Debridement Selective Debridement - Location: R ischium Selective Debridement - Tools Used: Forceps, Scalpel, Scissors Selective Debridement - Tissue Removed: Yellow unviable tissue    Wound Assessment and Plan  Wound Therapy - Assess/Plan/Recommendations Wound Therapy - Clinical Statement: Wound bed appears improved but with continued loose, yellow slough nonviable tissue at the very base of the wound. Assessed wound with WOC RN Rosalio Loud and confirmed to continue to utilize the [employer]. Pt reporting intermittent pain during session. This patient will benefit from continued hydrotherapy for selective removal of unviable tissue, to decrease bioburden, and promote wound bed healing. Wound Therapy - Functional Problem List: compromised mobility Factors Delaying/Impairing Wound Healing: Diabetes Mellitus, Immobility Hydrotherapy Plan: Debridement, Dressing change, Patient/family education Wound Therapy - Frequency:  (Tues/Friday) Wound Therapy - Follow Up Recommendations: dressing changes by RN  Wound Therapy Goals- Improve the function of patient's integumentary system by progressing the wound(s) through the phases of wound healing (inflammation - proliferation - remodeling) by: Wound Therapy Goals - Improve the function of patient's integumentary system by progressing the wound(s) through the phases of wound healing by: Decrease Necrotic Tissue to: 10% Decrease Necrotic Tissue - Progress: Progressing toward goal Increase Granulation Tissue to: 90% Increase Granulation Tissue - Progress: Progressing toward goal Improve Drainage Characteristics: Min, Serous Improve Drainage Characteristics - Progress: Progressing toward goal Goals/treatment plan/discharge plan were made with and agreed upon by patient/family: Yes Time For Goal Achievement: 7 days Wound Therapy - Potential for Goals: Good  Goals will be  updated until maximal potential achieved or discharge criteria met.  Discharge criteria: when goals achieved, discharge from hospital, MD decision/surgical intervention, no progress towards goals, refusal/missing three consecutive  treatments without notification or medical reason.  GP     Charges PT Wound Care Charges $Wound Debridement up to 20 cm: < or equal to 20 cm $ Wound Debridement each add'l 20 sqcm: 1 $PT Hydrotherapy Dressing: 3 dressings $PT Hydrotherapy Visit: 1 Visit     Moishe Spice, PT, DPT Acute Rehabilitation Services  Office: [phone]   Orvan Falconer 11/29/2021, 12:46 PM

## 2021-11-29 NOTE — Progress Notes (Signed)
Patient's catheter not working well,even with Heparin 2500 units bolus given at the start of the treatment. Alctivase 2 mg into each  catheter limb  for an hour dwelling.Will monitor. Renal on call made aware.

## 2021-11-29 NOTE — Progress Notes (Signed)
   11/29/21 0059  Vitals  Temp 98.2 F (36.8 C)  BP (!) 107/53  ECG Heart Rate 84  Resp 18  During Treatment Monitoring  Blood Flow Rate (mL/min) 400 mL/min  Arterial Pressure (mmHg) -210 mmHg  Venous Pressure (mmHg) 2100 mmHg  TMP (mmHg) -7 mmHg  Ultrafiltration Rate (mL/min) 1127 mL/min  Dialysate Flow Rate (mL/min) 300 ml/min  HD Safety Checks Performed Yes  Intra-Hemodialysis Comments Tx completed  Post Treatment  Dialyzer Clearance Heavily streaked  Duration of HD Treatment -hour(s) 3 hour(s)  Liters Processed 72  Fluid Removed 3  Tolerated HD Treatment Yes  Post-Hemodialysis Comments Did reverse flow cath, repo. PT, flushed lines.  Hemodialysis Catheter Left Internal jugular Double lumen Permanent (Tunneled)  Placement Date/Time: 11/28/21 1222   Serial / Lot #: [id number]  Expiration Date: 03/17/26  Time Out: Correct patient;Correct site;Correct procedure  Maximum sterile barrier precautions: Cap;Mask;Sterile gown;Large sterile sheet;Sterile gloves  Site P...  Site Condition No complications  Blue Lumen Status Infusing;Flushed;Heparin locked  Red Lumen Status Infusing;Flushed;Heparin locked  Catheter fill solution Heparin 1000 units/ml  Catheter fill volume (Arterial) 1.9 cc  Catheter fill volume (Venous) 1.9  Dressing Type Gauze/Drain sponge  Dressing Status Clean, Dry, Intact  Dressing Change Due 12/01/21  Post treatment catheter status Capped and Clamped   Post wt 118.0 kg

## 2021-11-29 NOTE — Progress Notes (Signed)
Occupational Therapy Treatment Patient Details Name: Courtney Gray MRN: [id number] DOB: 07-22-1970 Today's Date: 11/29/2021   History of present illness Pt adm 7/28 with acute pulmonary edema. 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   OT comments  Patient received in supine and asking to perform UB bathing and dressing. Patient asked if a female could assist with LB ADLs. Patient required increased time due to pain and need to readjust self or bed to relieve pain. Patient was mod assist to for UB bathing due to assistance with back and rolling and min assist to change gowns. Patient was setup for grooming.  Acute OT to continue to follow.    Recommendations for follow up therapy are one component of a multi-disciplinary discharge planning process, led by the attending physician.  Recommendations may be updated based on patient status, additional functional criteria and insurance authorization.    Follow Up Recommendations  OT at [facility]term acute care hospital    Assistance Recommended at Discharge Intermittent Supervision/Assistance  Patient can return home with the following  Two people to help with walking and/or transfers;Two people to help with bathing/dressing/bathroom;Assistance with cooking/housework;Direct supervision/assist for medications management;Assist for transportation;Help with stairs or ramp for entrance   Equipment Recommendations  Other (comment) (defer to next venue)    Recommendations for Other Services      Precautions / Restrictions Precautions Precautions: Fall Precaution Comments: bil AKA, 3L O2 at baseline, LUQ drain Restrictions Weight Bearing Restrictions: Yes RLE Weight Bearing: Non weight bearing LLE Weight Bearing: Non weight bearing Other Position/Activity Restrictions: minimized pushing/pulling and weight through L hand, dressing now removed       Mobility  Bed Mobility Overal bed mobility: Needs Assistance Bed Mobility: Rolling Rolling: Min assist, Mod assist         General bed mobility comments: min assist to roll to right and mod to roll to left    Transfers                   General transfer comment: deffered transfer OOB due to pain     Balance                                           ADL either performed or assessed with clinical judgement   ADL Overall ADL's : Needs assistance/impaired     Grooming: Wash/dry hands;Wash/dry face;Oral care;Brushing hair;Set up;Bed level Grooming Details (indicate cue type and reason): performed with HOB raised Upper Body Bathing: Moderate assistance;Bed level Upper Body Bathing Details (indicate cue type and reason): assistance for back and rolling     Upper Body Dressing : Minimal assistance;Bed level Upper Body Dressing Details (indicate cue type and reason): changed gown                   General ADL Comments: LB bathing not addressed due to patient asked for female to assist    Extremity/Trunk Assessment              Vision       Perception     Praxis      Cognition Arousal/Alertness: Awake/alert Behavior During Therapy: WFL for tasks assessed/performed Overall Cognitive Status: Within Functional Limits for tasks assessed  Exercises      Shoulder Instructions       General Comments patient seen for UB bathing/dressing and grooming at bed level due to pain and awaiting wound care PT    Pertinent Vitals/ Pain       Pain Assessment Pain Assessment: Faces Faces Pain Scale: Hurts even more Pain Location: R-buttock Pain Descriptors / Indicators: Discomfort, Guarding Pain Intervention(s): Limited activity within patient's tolerance, Monitored during session, Repositioned  Home Living                                          Prior  Functioning/Environment              Frequency  Min 2X/week        Progress Toward Goals  OT Goals(current goals can now be found in the care plan section)  Progress towards OT goals: Progressing toward goals  Acute Rehab OT Goals Patient Stated Goal: get better OT Goal Formulation: With patient Time For Goal Achievement: 12/04/21 Potential to Achieve Goals: Fair ADL Goals Pt Will Perform Grooming: Independently;sitting;bed level Pt Will Perform Lower Body Bathing: with mod assist;with adaptive equipment;sitting/lateral leans Pt Will Perform Lower Body Dressing: with mod assist;with adaptive equipment;sitting/lateral leans Pt Will Transfer to Toilet: with mod assist;with transfer board;bedside commode Pt/caregiver will Perform Home Exercise Program: Increased strength;Both right and left upper extremity;With theraband;Independently Additional ADL Goal #1: Pt will perform bed mobility with supervision in prep for ADLs Additional ADL Goal #2: Pt will tolerate sitting up in wheelchair/recliner for 1.5 hours in order to improve activity tolerance for ADLs/mobility.  Plan Discharge plan remains appropriate;Frequency remains appropriate    Co-evaluation                 AM-PAC OT "6 Clicks" Daily Activity     Outcome Measure   Help from another person eating meals?: A Little Help from another person taking care of personal grooming?: A Little Help from another person toileting, which includes using toliet, bedpan, or urinal?: Total Help from another person bathing (including washing, rinsing, drying)?: A Lot Help from another person to put on and taking off regular upper body clothing?: A Little Help from another person to put on and taking off regular lower body clothing?: A Lot 6 Click Score: 14    End of Session Equipment Utilized During Treatment: Oxygen  OT Visit Diagnosis: Muscle weakness (generalized) (M62.81);History of falling (Z91.81) Pain - Right/Left:  Right Pain - part of body: Leg   Activity Tolerance Patient limited by pain   Patient Left in bed;with call bell/phone within reach   Nurse Communication Mobility status        Time: 7619-5093 OT Time Calculation (min): 69 min  Charges: OT General Charges $OT Visit: 1 Visit OT Treatments $Self Care/Home Management : 68-82 mins  Lodema Hong, [place]  Office [phone]   Trixie Dredge 11/29/2021, 1:16 PM

## 2021-11-29 NOTE — Progress Notes (Signed)
Subjective: Seen in room no complaints for dialysis today, status post replacement hemodialysis catheter yesterday by IR  Objective Vital signs in last 24 hours: Vitals:   11/29/21 0030 11/29/21 0059 11/29/21 0140 11/29/21 0640  BP: (!) 92/53 (!) 107/53 (!) 106/42 (!) 134/39  Pulse:   82 76  Resp: 18 18 18 20   Temp:  98.2 F (36.8 C) 98.1 F (36.7 C) 98.3 F (36.8 C)  TempSrc:   Oral Oral  SpO2:   97% 100%  Weight:      Height:       Weight change: -10 kg  Physical Exam: General: Alert obese female NAD Heart: RRR no MRG Lungs: CTA bilaterally anteriorly nonlabored breathing 2 L nasal cannula oxygen Abdomen: Obese, soft NABS NT ND Extremities: No edema bilateral BKA Dialysis Access: Left IJ tunneled PermCath dressing dry intact  Dialysis Orders: MWF - [place] ---?transferring to [facility] 4hrs, BFR 400, DFR 600,  AVF, EDW 146kg, 3K/ 2Ca - Heparin 2500 units with HD - Hectorol 66mcg IV qHD, held as of 10/30/21    Problem/Plan: ESRD= HD MWF with abbreviated treatment Tuesday due to PermCath malfunction exchanged 8/22, fortunately not working again 8/24 IR exchanged PermCath 8/24 Dialysis PermCath malfunction history as above  Hypertension/volume= noted on metoprolol 12.5 mg twice daily, does not appear to have significant excess volume no shortness of breath chronic oxygen Chronic respiratory failure with hypoxia on cannula oxygen at home being to wean oxygen was on 4 L Anemia -Hgb 9.2 Aranesp q. Friday 150 mcg increase, IV iron weekly for now since she is off IV antibiotics Secondary hyperparathyroidism -corrected calcium has been high vitamin D on hold follow-up trend, phosphorus 8.0 Renvela not compliant in hospital PAD= status post bilateral AKA for gangrene follow-up Dr. Stanford Breed, status post left middle finger amputation for gangrene Decubitus ulcers= plan per admit team wound care Disposition= Difficult situation recently=". Patient required 4 staff members and 2  hoyer pads for transfer into HD chair as OP."  Will not stay on dialysis for full treatment because of pain of multiple decubitus ulcers, less likely LTAC discharge  Ernest Haber, PA-C [place] [phone] 11/29/2021,3:48 PM  LOS: 28 days   Labs: Basic Metabolic Panel: Recent Labs  Lab 11/26/21 0457 11/27/21 1452 11/28/21 0421  NA 136 134* 132*  K 4.7 4.0 4.9  CL 98 97* 96*  CO2 22 25 20*  GLUCOSE 117* 133* 95  BUN 53* 40* 47*  CREATININE 8.71* 7.00* 7.68*  CALCIUM 8.5* 8.5* 8.8*  PHOS 8.2* 7.3* 8.0*   Liver Function Tests: Recent Labs  Lab 11/26/21 0457 11/27/21 1452 11/28/21 0421  ALBUMIN 2.3* 2.2* 2.4*   No results for input(s): "LIPASE", "AMYLASE" in the last 168 hours. No results for input(s): "AMMONIA" in the last 168 hours. CBC: Recent Labs  Lab 11/23/21 0946 11/25/21 0814 11/27/21 1452 11/28/21 0421  WBC 10.5 13.1* 14.0* 14.3*  NEUTROABS  --   --  10.3* 10.2*  HGB 8.9* 8.1* 8.3* 9.2*  HCT 29.0* 26.7* 26.7* 29.1*  MCV 96.7 98.9 97.1 94.8  PLT 209 254 189 233   Cardiac Enzymes: No results for input(s): "CKTOTAL", "CKMB", "CKMBINDEX", "TROPONINI" in the last 168 hours. CBG: Recent Labs  Lab 11/28/21 1639 11/28/21 2035 11/29/21 0141 11/29/21 0639 11/29/21 1239  GLUCAP 162* 96 79 220* 88    Studies/Results: IR Fluoro Guide CV Line Left  Result Date: 11/28/2021 INDICATION: 51 year old female with recurrent nonfunctional left IJ hemodialysis catheter. The patient  has occluded right IJ and with difficult femoral vein access. EXAM: IMAGE GUIDED EXCHANGE OF LEFT IJ TUNNELED HEMODIALYSIS CATHETER BALLOON ANGIOPLASTY OF THE LEFT BRACHIOCEPHALIC VEIN AND CHRONIC FIBRIN SHEATH MEDICATIONS: 50 mcg fentanyl; The antibiotic was administered within an appropriate time interval prior to skin puncture. ANESTHESIA/SEDATION: Moderate (conscious) sedation was not employed during this procedure. A total of Versed 0 mg and Fentanyl 50 mcg was  administered intravenously by the radiology nurse. Total intra-service moderate Sedation Time: 0 minutes. The patient's level of consciousness and vital signs were monitored continuously by radiology nursing throughout the procedure under my direct supervision. FLUOROSCOPY: Radiation Exposure Index (as provided by the fluoroscopic device): 3 minutes, 12 seconds COMPLICATIONS: None PROCEDURE: Informed written consent was obtained from the patient after a thorough discussion of the procedural risks, benefits and alternatives. All questions were addressed. Maximal Sterile Barrier Technique was utilized including caps, mask, sterile gowns, sterile gloves, sterile drape, hand hygiene and skin antiseptic. A timeout was performed prior to the initiation of the procedure. Patient was positioned supine on the image intensifier table. The left IJ/chest wall in the indwelling catheter were prepped and draped in the usual sterile fashion. Aspiration of the hub of the indwelling hemodialysis catheter performed, with the red port nonfunctional. 1% lidocaine was used for local anesthesia. Stiff Glidewire was then advanced through the red port into the IVC and the catheter was removed from the tract. 10 mm balloon angioplasty was then performed along the length of the right atrium and the left brachiocephalic vein. A new 23 cm tip to cuff catheter was placed on the wire into position. Catheter was tested and was found to be nonfunctional. Stiff Glidewire was then again advanced into the IVC. 14 mm balloon angioplasty was then performed on the stiff Glidewire along the length of the left brachiocephalic vein, at the junction of the brachiocephalic vein and the superior vena cava, and through the right atrium. The balloon catheter was removed with some chronic fibrin and thrombus attached. This debris was removed. The catheter was again advanced to the superior cavoatrial junction. Both the red and blue hub aspirated vigorously,  confirming function. Catheter was then sutured in position. Heparin dwell was infused through both of the ports. Final image was stored. Patient tolerated the procedure well and remained hemodynamically stable throughout. No complications were encountered and no significant blood loss. IMPRESSION: Status post balloon angioplasty of chronic fibrin sheath involving the left brachiocephalic vein, brachial cava junction, superior vena cava, and the tract through the right atrium, with exchange of hemodialysis catheter. New catheter was vigorously aspirated confirming function at the completion of the procedure. Signed, Dulcy Fanny. Nadene Rubins, RPVI [facility] Radiology Electronically Signed   By: Corrie Mckusick D.O.   On: 11/28/2021 13:37   IR PTA VENOUS EXCEPT DIALYSIS CIRCUIT  Result Date: 11/28/2021 INDICATION: 51 year old female with recurrent nonfunctional left IJ hemodialysis catheter. The patient has occluded right IJ and with difficult femoral vein access. EXAM: IMAGE GUIDED EXCHANGE OF LEFT IJ TUNNELED HEMODIALYSIS CATHETER BALLOON ANGIOPLASTY OF THE LEFT BRACHIOCEPHALIC VEIN AND CHRONIC FIBRIN SHEATH MEDICATIONS: 50 mcg fentanyl; The antibiotic was administered within an appropriate time interval prior to skin puncture. ANESTHESIA/SEDATION: Moderate (conscious) sedation was not employed during this procedure. A total of Versed 0 mg and Fentanyl 50 mcg was administered intravenously by the radiology nurse. Total intra-service moderate Sedation Time: 0 minutes. The patient's level of consciousness and vital signs were monitored continuously by radiology nursing throughout  the procedure under my direct supervision. FLUOROSCOPY: Radiation Exposure Index (as provided by the fluoroscopic device): 3 minutes, 12 seconds COMPLICATIONS: None PROCEDURE: Informed written consent was obtained from the patient after a thorough discussion of the procedural risks,  benefits and alternatives. All questions were addressed. Maximal Sterile Barrier Technique was utilized including caps, mask, sterile gowns, sterile gloves, sterile drape, hand hygiene and skin antiseptic. A timeout was performed prior to the initiation of the procedure. Patient was positioned supine on the image intensifier table. The left IJ/chest wall in the indwelling catheter were prepped and draped in the usual sterile fashion. Aspiration of the hub of the indwelling hemodialysis catheter performed, with the red port nonfunctional. 1% lidocaine was used for local anesthesia. Stiff Glidewire was then advanced through the red port into the IVC and the catheter was removed from the tract. 10 mm balloon angioplasty was then performed along the length of the right atrium and the left brachiocephalic vein. A new 23 cm tip to cuff catheter was placed on the wire into position. Catheter was tested and was found to be nonfunctional. Stiff Glidewire was then again advanced into the IVC. 14 mm balloon angioplasty was then performed on the stiff Glidewire along the length of the left brachiocephalic vein, at the junction of the brachiocephalic vein and the superior vena cava, and through the right atrium. The balloon catheter was removed with some chronic fibrin and thrombus attached. This debris was removed. The catheter was again advanced to the superior cavoatrial junction. Both the red and blue hub aspirated vigorously, confirming function. Catheter was then sutured in position. Heparin dwell was infused through both of the ports. Final image was stored. Patient tolerated the procedure well and remained hemodynamically stable throughout. No complications were encountered and no significant blood loss. IMPRESSION: Status post balloon angioplasty of chronic fibrin sheath involving the left brachiocephalic vein, brachial cava junction, superior vena cava, and the tract through the right atrium, with exchange of  hemodialysis catheter. New catheter was vigorously aspirated confirming function at the completion of the procedure. Signed, Dulcy Fanny. Nadene Rubins, RPVI [facility] Inter[facility] Radiology Specialists [facility] Radiology Electronically Signed   By: Corrie Mckusick D.O.   On: 11/28/2021 13:37   Medications:  anticoagulant sodium citrate     ferric gluconate (FERRLECIT) IVPB 110 mL/hr at 11/26/21 1706    alteplase  2 mg Intracatheter Once   alteplase  2 mg Intracatheter Once   atorvastatin  10 mg Oral Daily   darbepoetin (ARANESP) injection - DIALYSIS  100 mcg Intravenous Q Fri-HD   feeding supplement  1 Container Oral TID BM   gabapentin  100 mg Oral Q1200   gabapentin  200 mg Oral QHS   heparin  5,000 Units Subcutaneous Q12H   insulin aspart  0-5 Units Subcutaneous QHS   insulin aspart  0-6 Units Subcutaneous TID WC   leptospermum manuka honey  1 Application Topical Daily   levothyroxine  100 mcg Oral Q0600   metoprolol tartrate  12.5 mg Oral BID   pantoprazole  40 mg Oral Daily   sevelamer carbonate  0.8 g Oral TID WC

## 2021-11-29 NOTE — Progress Notes (Signed)
Physical Therapy Treatment Patient Details Name: Courtney Gray MRN: [id number] DOB: [birth year] Today's Date: 11/29/2021   History of Present Illness Pt adm 7/28 with acute pulmonary edema. 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    PT Comments    Pt to go to HD imminently so limited session to in bed. Spent increased time educating pt on wound formation, progression and healing process and need to off weight wound to improve blood supply. Pt with preference of leaning onto R hip after amputation on L, which was a contributing factor to wound formation. Pt also likes to sit all the way upright increasing ischial pressure. Pt has had historical problem with breathing and finds upright posture improves her breathing. Able to demonstrate to that she can indeed tolerate flattened bed at this point. Provided education on bed controls and pt able to completely offweight R ischial with min A in flattened bed. D/c plan remains appropriate at this time. PT will continue to follow acutely.    Recommendations for follow up therapy are one component of a multi-disciplinary discharge planning process, led by the attending physician.  Recommendations may be updated based on patient status, additional functional criteria and insurance authorization.  Follow Up Recommendations  Acute inpatient rehab (3hours/day) Can patient physically be transported by private vehicle: No   Assistance Recommended at Discharge Frequent or constant Supervision/Assistance  Patient can return home with the following Two people to help with walking and/or transfers;Two people to help with bathing/dressing/bathroom;Assist for transportation;Assistance with cooking/housework;Help with stairs or ramp for entrance   Equipment Recommendations  Wheelchair (measurements PT);Wheelchair cushion (measurements PT);Other (comment);Hospital bed        Precautions / Restrictions Precautions Precautions: Fall Precaution Comments: bil AKA, 3L O2 at baseline, LUQ drain Restrictions Weight Bearing Restrictions: Yes RLE Weight Bearing: Non weight bearing LLE Weight Bearing: Non weight bearing Other Position/Activity Restrictions: minimized pushing/pulling and weight through L hand, dressing now removed     Mobility  Bed Mobility Overal bed mobility: Needs Assistance Bed Mobility: Rolling Rolling: Min assist, Min guard (to rt min assist; to left no assist except rail)         General bed mobility comments: after increased education on need for offweighting R ischial wound for improved healing    Transfers                   General transfer comment: pt deferred transfer OOB due to impending HD                       Cognition Arousal/Alertness: Awake/alert Behavior During Therapy: WFL for tasks assessed/performed Overall Cognitive Status: Within Functional Limits for tasks assessed                                          Exercises Amputee Exercises Hip ABduction/ADduction: AROM, Both, 10 reps Hip Flexion/Marching: AROM, Both, 10 reps    General Comments General comments (skin integrity, edema, etc.): Education on wound formation and healing, Pt on 3L O2 via [place] and able to tolerate fully supine      Pertinent Vitals/Pain Pain Assessment Pain Assessment: Faces Faces Pain Scale: Hurts even more Pain Location: R-ischial wound Pain Descriptors / Indicators: Discomfort, Guarding Pain Intervention(s): Limited activity within patient's tolerance, Monitored during  session, Repositioned     PT Goals (current goals can now be found in the care plan section) Acute Rehab PT Goals Patient Stated Goal: ultimately wants to be modified independent at w/c level PT Goal Formulation: With patient Time For Goal Achievement: 12/03/21 Potential to Achieve Goals: Fair Progress towards PT goals:  Progressing toward goals    Frequency    Min 3X/week      PT Plan Current plan remains appropriate       AM-PAC PT "6 Clicks" Mobility   Outcome Measure  Help needed turning from your back to your side while in a flat bed without using bedrails?: A Lot Help needed moving from lying on your back to sitting on the side of a flat bed without using bedrails?: Total Help needed moving to and from a bed to a chair (including a wheelchair)?: Total Help needed standing up from a chair using your arms (e.g., wheelchair or bedside chair)?: Total Help needed to walk in hospital room?: Total Help needed climbing 3-5 steps with a railing? : Total 6 Click Score: 7    End of Session Equipment Utilized During Treatment: Oxygen Activity Tolerance: Patient tolerated treatment well Patient left: in bed;with call bell/phone within reach Nurse Communication: Mobility status PT Visit Diagnosis: Other abnormalities of gait and mobility (R26.89);Muscle weakness (generalized) (M62.81) Pain - Right/Left: Right Pain - part of body: Leg     Time: 8546-2703 PT Time Calculation (min) (ACUTE ONLY): 31 min  Charges:  $Therapeutic Exercise: 8-22 mins $Self Care/Home Management: 8-22                     Shalamar Plourde B. Migdalia Dk PT, DPT Acute Rehabilitation Services Please use secure chat or  Call Office [phone]    [place] 11/29/2021, 3:51 PM

## 2021-11-29 NOTE — Progress Notes (Signed)
Progress Note Patient: Courtney Gray PJK:[id number] DOB: 1970-08-31 DOA: 11/01/2021  DOS: the patient was seen and examined on 11/29/2021  Brief hospital course: 51 y.o. female with medical history significant of ESRD on HD TTS, recent bacteremia and hepatic abscess on long-term IV antibiotics, chronic HF PEF, PVD status post bilateral AKA, IIDM, morbid obesity, chronic sacral wound stage II, presented with increasing shortness of breath.   Patient underwent bilateral AKA recently including right AKA in May and left AKA on 7/5.  And patient was recently diagnosed with septic shock secondary to bacteremia and liver abscess, for which patient has been on cefepime and Flagyl and the last dose will be August 4.  Recently, since patient has bilateral AKA, she has had extreme difficulty to get access to outpatient HD center.  She never missed her dialysis however according to outpatient HD records, it takes at least 4 staff members and [facility] lift for patient to be seated in the dialysis chair.  Starting today, patient started to feel shortness of breath, no chest pain no swelling.  No cough no fever or chills. She is now awaiting placement for HD that takes stretcher and is hoping to find one locally.  Assessment and Plan: Acute on chronic diastolic CHF with chronic hypoxemic respiratory failure -Secondary to uncontrolled hypertension, patient with signs of mild volume overload on presentation.  Now resolved. -Volume management per HD/nephrology.   Hypertensive emergency -Was on Imdur, metoprolol. -Due to soft blood pressure during HD Imdur held. -Blood pressure stable on current regimen of Lopressor 12 mg twice daily.    ESRD on HD -Nephrology consulted and managing hemodialysis. -Patient noted during HD treatment to have catheter issues and hemodialysis stopped on 11/25/2021.  IR consulted underwent catheter exchange on 8/22. Required PTA of chronic fibrin sheath of left brachiocephalic and SVC  8/09. HD catheter nonfunctioning again on 8/25.  Management per IR and nephrology. -TOC following and helping with placement.   Anemia of CKD -IV iron weekly per nephrology. -H&H stable.   Constipation Resolved.   Nausea/vomiting -Resolved. -Tolerating current diet.   Well-controlled insulin-dependent diabetes mellitus -Hemoglobin A1c 5.7 -SSI.   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 -Patient receiving IV cefepime on HD and Flagyl which was completed 8/4. -Consulted IR to evaluate for abdominal drain that was in place IR seen and drain removed 8/9. -Patient noted to have missed ID clinic appointment on 11/01/2021. -Patient seen by ID no indication for further antibiotics at this time, IR to remove drain per ID, ID has since signed off. -Abdominal discomfort improved post drain removal.   PAD -Status post bilateral AKA for gangrene with plan to follow-up with vascular surgery in a month (Dr. Stanford Breed on 11/21/2021 for staple removal) -Patient is status post left middle finger amputation for gangrene during recent admission. -Vascular surgery informed of patient's admission.   Hyperlipidemia -Statin.   Chronic respiratory failure with hypoxia -Patient noted to report uses 4 L/Min home O2 prior to admission. -Currently sats of 100 % on 4 L nasal cannula. -Wean O2.  Morbid obesity. Body mass index is 44.39 kg/m.  Placing the pt at higher risk of poor outcomes.  Right hip unstageable pressure injury. Left flank stage II pressure ulcer. Both present on  admission. Continue wound care recommendation.  LALM and Medihoney.  Hypotension. We will initiate midodrine.  Abdominal  pain. X-ray abdomen unremarkable. Monitor for now.  Hypomagnesemia. We will replace   Leukocytosis. Etiology not clear. For now we will monitor.  Likely stress reaction.  Subjective: Reports abdominal discomfort.  No nausea no vomiting.  Concerned with stool softeners and therefore wants to modify the regimen.  At HD was not able to complete the session due to malfunctioning catheter and then had abdominal discomfort as well as clammy feeling.  Blood pressure improved after minor bolus.  Physical Exam: Vitals:   11/29/21 0059 11/29/21 0140 11/29/21 0640 11/29/21 1809  BP: (!) 107/53 (!) 106/42 (!) 134/39 (!) 85/55  Pulse:  82 76 75  Resp: 18 18 20 17   Temp: 98.2 F (36.8 C) 98.1 F (36.7 C) 98.3 F (36.8 C)   TempSrc:  Oral Oral   SpO2:  97% 100% 97%  Weight:      Height:       General: Appear in mild distress; no visible Abnormal Neck Mass Or lumps, Conjunctiva normal Cardiovascular: S1 and S2 Present, aortic systolic Murmur, Respiratory: good respiratory effort, Bilateral Air entry present and CTA, no Crackles, no wheezes Abdomen: Bowel Sound present, Non tender Extremities: Bilateral BKA Neurology: alert and oriented to Self, Place and time.  Data Reviewed: I have Reviewed nursing notes, Vitals, and Lab results since pt's last encounter. Pertinent lab results CBC and BMP I have ordered test including CBC and BMP     Family Communication: No one at bedside  Disposition: Status is: Inpatient Remains inpatient appropriate because: Currently awaiting placement.  Needs HD for stretcher.  Author: Berle Mull, MD 11/29/2021 7:55 PM  Please look on [email or web address] to find out who is on call.

## 2021-11-29 NOTE — Consult Note (Signed)
[place] Nurse wound follow up Patient receiving care in [facility] 5M06 Wound type: Unstageable right ischial wound Measurement: See PT notes from today Wound bed: 75% red granulation tissue with 25% yellow fibrinous tissue deep in the center of the wound. See updated photo take by PT today Drainage (amount, consistency, odor) Yellow tan Periwound: intact Dressing procedure/placement/frequency: Continue MediHoney followed by dry gauze and secured with foam dressing.  WOC will see again next Friday with PT.  Cathlean Marseilles Tamala Julian, MSN, RN, [place], Lysle Pearl, [facility] Wound Treatment Associate Pager [phone]

## 2021-11-30 DIAGNOSIS — N186 End stage renal disease: Secondary | ICD-10-CM | POA: Diagnosis not present

## 2021-11-30 LAB — RENAL FUNCTION PANEL
Albumin: 2.3 g/dL — ABNORMAL LOW (ref 3.5–5.0)
Anion gap: 15 (ref 5–15)
BUN: 44 mg/dL — ABNORMAL HIGH (ref 6–20)
CO2: 21 mmol/L — ABNORMAL LOW (ref 22–32)
Calcium: 9 mg/dL (ref 8.9–10.3)
Chloride: 99 mmol/L (ref 98–111)
Creatinine, Ser: 6.79 mg/dL — ABNORMAL HIGH (ref 0.44–1.00)
GFR, Estimated: 7 mL/min — ABNORMAL LOW (ref >60)
Glucose, Bld: 107 mg/dL — ABNORMAL HIGH (ref 70–99)
Phosphorus: 7.8 mg/dL — ABNORMAL HIGH (ref 2.5–4.6)
Potassium: 4.2 mmol/L (ref 3.5–5.1)
Sodium: 135 mmol/L (ref 135–145)

## 2021-11-30 LAB — CBC WITH DIFFERENTIAL/PLATELET
Abs Immature Granulocytes: 0.16 10*3/uL — ABNORMAL HIGH (ref 0.00–0.07)
Basophils Absolute: 0.1 10*3/uL (ref 0.0–0.1)
Basophils Relative: 1 %
Eosinophils Absolute: 0.7 10*3/uL — ABNORMAL HIGH (ref 0.0–0.5)
Eosinophils Relative: 5 %
HCT: 28.6 % — ABNORMAL LOW (ref 36.0–46.0)
Hemoglobin: 9 g/dL — ABNORMAL LOW (ref 12.0–15.0)
Immature Granulocytes: 1 %
Lymphocytes Relative: 11 %
Lymphs Abs: 1.6 10*3/uL (ref 0.7–4.0)
MCH: 30 pg (ref 26.0–34.0)
MCHC: 31.5 g/dL (ref 30.0–36.0)
MCV: 95.3 fL (ref 80.0–100.0)
Monocytes Absolute: 1.5 10*3/uL — ABNORMAL HIGH (ref 0.1–1.0)
Monocytes Relative: 10 %
Neutro Abs: 10.5 10*3/uL — ABNORMAL HIGH (ref 1.7–7.7)
Neutrophils Relative %: 72 %
Platelets: 216 10*3/uL (ref 150–400)
RBC: 3 MIL/uL — ABNORMAL LOW (ref 3.87–5.11)
RDW: 15.9 % — ABNORMAL HIGH (ref 11.5–15.5)
WBC: 14.6 10*3/uL — ABNORMAL HIGH (ref 4.0–10.5)
nRBC: 0 % (ref 0.0–0.2)

## 2021-11-30 LAB — MAGNESIUM: Magnesium: 2.2 mg/dL (ref 1.7–2.4)

## 2021-11-30 LAB — GLUCOSE, CAPILLARY
Glucose-Capillary: 100 mg/dL — ABNORMAL HIGH (ref 70–99)
Glucose-Capillary: 108 mg/dL — ABNORMAL HIGH (ref 70–99)
Glucose-Capillary: 104 mg/dL — ABNORMAL HIGH (ref 70–99)
Glucose-Capillary: 118 mg/dL — ABNORMAL HIGH (ref 70–99)

## 2021-11-30 MED ORDER — DARBEPOETIN ALFA 100 MCG/0.5ML IJ SOSY
100.0000 ug | PREFILLED_SYRINGE | Freq: Once | INTRAMUSCULAR | Status: AC
  Administered 2021-11-30: 100 ug via SUBCUTANEOUS
  Filled 2021-11-30: qty 0.5

## 2021-11-30 NOTE — Procedures (Signed)
PT came to unit and returned to room due to of a need to use bath room and lowered bp. Day RN stated she needed a bolus that the the PT had lowered bp, not only that, she didn't use the bathroom. Later the night RN stated the PT needed a second bolus for bp. PT did not run today, the day RN said she wasn't willing to tx. Will have cathflow over nite.

## 2021-11-30 NOTE — Progress Notes (Addendum)
Subjective: No complaints in room, noted complications again with HD catheter last  night required Cathflo then noted 400 bfr .   Sp IR HD catheter replacement 11/28/21  Objective Vital signs in last 24 hours: Vitals:   11/29/21 2122 11/29/21 2128 11/30/21 0635 11/30/21 1010  BP: (!) 131/55 (!) 131/55 (!) 140/46 (!) 148/71  Pulse: 94  73 67  Resp: 17 17 18 19   Temp: 99 F (37.2 C) 99 F (37.2 C) 98.4 F (36.9 C) 98.4 F (36.9 C)  TempSrc: Oral   Oral  SpO2: (!) 89% 100% 99% 97%  Weight:      Height:       Weight change:   Physical Exam: General: Alert obese female NAD Heart: RRR no MRG Lungs: CTA bilaterally anteriorly nonlabored breathing 2 L nasal cannula oxygen Abdomen: Obese, soft NABS NT ND Extremities: No edema bilateral BKA Dialysis Access: Left IJ tunneled PermCath dressing dry intact   Dialysis Orders: MWF - [place] ---?transferring to [facility] 4hrs, BFR 400, DFR 600,  AVF, EDW 146kg, 3K/ 2Ca - Heparin 2500 units with HD - Hectorol 35mcg IV qHD, held as of 10/30/21       Problem/Plan: ESRD= HD MWF with abbreviated treatment Tuesday due to PermCath malfunction exchanged 8/22, un fortunately not working again 8/24 IR exchanged PermCath 8/24, again last night required Cathflo needed yest  then 400 bfr documented ,will increase heparin HD.  A.m. lab K4.2 BUN 44 CR 6.79 and a 135 CO2 21 Dialysis PermCath malfunction history as above  Hypertension/volume= noted on metoprolol 12.5 mg twice daily, does not appear to have significant excess volume no shortness of breath chronic oxygen Chronic respiratory failure with hypoxia on cannula oxygen at home being to wean oxygen was on 4 L, now on 3 L Anemia -Hgb 9.0 <9.2 Aranesp q. Friday 150 mcg increase, IV iron weekly for now since she is off IV antibiotics Secondary hyperparathyroidism -corrected calcium has been high vitamin D on hold follow-up trend, phosphorus 7.8<8.0 Renvela not compliant in hospital PAD= status  post bilateral AKA for gangrene follow-up Dr. Stanford Breed, status post left middle finger amputation for gangrene Decubitus ulcers= plan per admit team wound care Disposition= Difficult situation recently=". Patient required 4 staff members and 2 hoyer pads for transfer into HD chair as OP."  Will not stay on dialysis for full treatment because of pain of multiple decubitus ulcers, less likely LTAC discharge  Ernest Haber, PA-C [place] [phone] 11/30/2021,10:26 AM  LOS: 29 days   Labs: Basic Metabolic Panel: Recent Labs  Lab 11/27/21 1452 11/28/21 0421 11/30/21 0748  NA 134* 132* 135  K 4.0 4.9 4.2  CL 97* 96* 99  CO2 25 20* 21*  GLUCOSE 133* 95 107*  BUN 40* 47* 44*  CREATININE 7.00* 7.68* 6.79*  CALCIUM 8.5* 8.8* 9.0  PHOS 7.3* 8.0* 7.8*   Liver Function Tests: Recent Labs  Lab 11/27/21 1452 11/28/21 0421 11/30/21 0748  ALBUMIN 2.2* 2.4* 2.3*   No results for input(s): "LIPASE", "AMYLASE" in the last 168 hours. No results for input(s): "AMMONIA" in the last 168 hours. CBC: Recent Labs  Lab 11/25/21 0814 11/25/21 0814 11/27/21 1452 11/28/21 0421 11/29/21 1810 11/30/21 0748  WBC 13.1*  --  14.0* 14.3* 16.4* 14.6*  NEUTROABS  --    < > 10.3* 10.2* 12.8* 10.5*  HGB 8.1*  --  8.3* 9.2* 8.8* 9.0*  HCT 26.7*  --  26.7* 29.1* 27.9* 28.6*  MCV 98.9  --  97.1 94.8 95.5 95.3  PLT 254  --  189 233 219 216   < > = values in this interval not displayed.   Cardiac Enzymes: No results for input(s): "CKTOTAL", "CKMB", "CKMBINDEX", "TROPONINI" in the last 168 hours. CBG: Recent Labs  Lab 11/29/21 0639 11/29/21 1239 11/29/21 1803 11/29/21 2123 11/30/21 0723  GLUCAP 220* 88 134* 167* 100*    Studies/Results: DG Abd Portable 1V  Result Date: 11/29/2021 CLINICAL DATA:  Abdominal pain for 2 days, initial encounter EXAM: PORTABLE ABDOMEN - 1 VIEW COMPARISON:  11/13/2021 FINDINGS: Scattered large and small bowel gas is noted. No free air is seen. No  focal mass or abnormal calcifications are noted. No acute bony abnormality is seen. IMPRESSION: No acute abnormality noted. Electronically Signed   By: Inez Catalina M.D.   On: 11/29/2021 19:22   IR Fluoro Guide CV Line Left  Result Date: 11/28/2021 INDICATION: 51 year old female with recurrent nonfunctional left IJ hemodialysis catheter. The patient has occluded right IJ and with difficult femoral vein access. EXAM: IMAGE GUIDED EXCHANGE OF LEFT IJ TUNNELED HEMODIALYSIS CATHETER BALLOON ANGIOPLASTY OF THE LEFT BRACHIOCEPHALIC VEIN AND CHRONIC FIBRIN SHEATH MEDICATIONS: 50 mcg fentanyl; The antibiotic was administered within an appropriate time interval prior to skin puncture. ANESTHESIA/SEDATION: Moderate (conscious) sedation was not employed during this procedure. A total of Versed 0 mg and Fentanyl 50 mcg was administered intravenously by the radiology nurse. Total intra-service moderate Sedation Time: 0 minutes. The patient's level of consciousness and vital signs were monitored continuously by radiology nursing throughout the procedure under my direct supervision. FLUOROSCOPY: Radiation Exposure Index (as provided by the fluoroscopic device): 3 minutes, 12 seconds COMPLICATIONS: None PROCEDURE: Informed written consent was obtained from the patient after a thorough discussion of the procedural risks, benefits and alternatives. All questions were addressed. Maximal Sterile Barrier Technique was utilized including caps, mask, sterile gowns, sterile gloves, sterile drape, hand hygiene and skin antiseptic. A timeout was performed prior to the initiation of the procedure. Patient was positioned supine on the image intensifier table. The left IJ/chest wall in the indwelling catheter were prepped and draped in the usual sterile fashion. Aspiration of the hub of the indwelling hemodialysis catheter performed, with the red port nonfunctional. 1% lidocaine was used for local anesthesia. Stiff Glidewire was then  advanced through the red port into the IVC and the catheter was removed from the tract. 10 mm balloon angioplasty was then performed along the length of the right atrium and the left brachiocephalic vein. A new 23 cm tip to cuff catheter was placed on the wire into position. Catheter was tested and was found to be nonfunctional. Stiff Glidewire was then again advanced into the IVC. 14 mm balloon angioplasty was then performed on the stiff Glidewire along the length of the left brachiocephalic vein, at the junction of the brachiocephalic vein and the superior vena cava, and through the right atrium. The balloon catheter was removed with some chronic fibrin and thrombus attached. This debris was removed. The catheter was again advanced to the superior cavoatrial junction. Both the red and blue hub aspirated vigorously, confirming function. Catheter was then sutured in position. Heparin dwell was infused through both of the ports. Final image was stored. Patient tolerated the procedure well and remained hemodynamically stable throughout. No complications were encountered and no significant blood loss. IMPRESSION: Status post balloon angioplasty of chronic fibrin sheath involving the left brachiocephalic vein, brachial cava junction, superior vena cava, and the tract through the right atrium, with  exchange of hemodialysis catheter. New catheter was vigorously aspirated confirming function at the completion of the procedure. Signed, Dulcy Fanny. Nadene Rubins, RPVI [facility] Radiology Electronically Signed   By: Corrie Mckusick D.O.   On: 11/28/2021 13:37   IR PTA VENOUS EXCEPT DIALYSIS CIRCUIT  Result Date: 11/28/2021 INDICATION: 51 year old female with recurrent nonfunctional left IJ hemodialysis catheter. The patient has occluded right IJ and with difficult femoral vein access. EXAM: IMAGE GUIDED EXCHANGE OF LEFT IJ TUNNELED HEMODIALYSIS CATHETER BALLOON ANGIOPLASTY  OF THE LEFT BRACHIOCEPHALIC VEIN AND CHRONIC FIBRIN SHEATH MEDICATIONS: 50 mcg fentanyl; The antibiotic was administered within an appropriate time interval prior to skin puncture. ANESTHESIA/SEDATION: Moderate (conscious) sedation was not employed during this procedure. A total of Versed 0 mg and Fentanyl 50 mcg was administered intravenously by the radiology nurse. Total intra-service moderate Sedation Time: 0 minutes. The patient's level of consciousness and vital signs were monitored continuously by radiology nursing throughout the procedure under my direct supervision. FLUOROSCOPY: Radiation Exposure Index (as provided by the fluoroscopic device): 3 minutes, 12 seconds COMPLICATIONS: None PROCEDURE: Informed written consent was obtained from the patient after a thorough discussion of the procedural risks, benefits and alternatives. All questions were addressed. Maximal Sterile Barrier Technique was utilized including caps, mask, sterile gowns, sterile gloves, sterile drape, hand hygiene and skin antiseptic. A timeout was performed prior to the initiation of the procedure. Patient was positioned supine on the image intensifier table. The left IJ/chest wall in the indwelling catheter were prepped and draped in the usual sterile fashion. Aspiration of the hub of the indwelling hemodialysis catheter performed, with the red port nonfunctional. 1% lidocaine was used for local anesthesia. Stiff Glidewire was then advanced through the red port into the IVC and the catheter was removed from the tract. 10 mm balloon angioplasty was then performed along the length of the right atrium and the left brachiocephalic vein. A new 23 cm tip to cuff catheter was placed on the wire into position. Catheter was tested and was found to be nonfunctional. Stiff Glidewire was then again advanced into the IVC. 14 mm balloon angioplasty was then performed on the stiff Glidewire along the length of the left brachiocephalic vein, at the  junction of the brachiocephalic vein and the superior vena cava, and through the right atrium. The balloon catheter was removed with some chronic fibrin and thrombus attached. This debris was removed. The catheter was again advanced to the superior cavoatrial junction. Both the red and blue hub aspirated vigorously, confirming function. Catheter was then sutured in position. Heparin dwell was infused through both of the ports. Final image was stored. Patient tolerated the procedure well and remained hemodynamically stable throughout. No complications were encountered and no significant blood loss. IMPRESSION: Status post balloon angioplasty of chronic fibrin sheath involving the left brachiocephalic vein, brachial cava junction, superior vena cava, and the tract through the right atrium, with exchange of hemodialysis catheter. New catheter was vigorously aspirated confirming function at the completion of the procedure. Signed, Dulcy Fanny. Nadene Rubins, RPVI [facility] Inter[facility] Radiology Special[facility] Radiology Electronically Signed   By: Corrie Mckusick D.O.   On: 11/28/2021 13:37   Medications:  ferric gluconate (FERRLECIT) IVPB 110 mL/hr at 11/26/21 1706    atorvastatin  10 mg Oral Daily   darbepoetin (ARANESP) injection - DIALYSIS  100 mcg Intravenous Q Fri-HD   darbepoetin (ARANESP) injection - NON-DIALYSIS  100 mcg Subcutaneous Once   feeding supplement  1 Container Oral TID BM   gabapentin  100 mg Oral Q1200   gabapentin  200 mg Oral QHS   heparin  5,000 Units Subcutaneous Q12H   insulin aspart  0-5 Units Subcutaneous QHS   insulin aspart  0-6 Units Subcutaneous TID WC   leptospermum manuka honey  1 Application Topical Daily   levothyroxine  100 mcg Oral Q0600   metoprolol tartrate  12.5 mg Oral BID   pantoprazole  40 mg Oral Daily   sevelamer carbonate  0.8 g Oral TID WC

## 2021-11-30 NOTE — Progress Notes (Signed)
Mobility Specialist Progress Note   11/30/21 1127  Mobility  Activity Moved into chair position in bed  Range of Motion/Exercises All extremities  Level of Assistance Standby assist, set-up cues, supervision of patient - no hands on  Assistive Device None  RLE Weight Bearing NWB  LLE Weight Bearing NWB  Activity Response Tolerated well  $Mobility charge 1 Mobility   Pt received in bed c/o slight headache but agreeable to mobility. Pt completed UE bed level exercises and lower limb AROM during session w/o complaint or fault. Left in chair position in bed w/ call bell in reach and needs met.   Holland Falling Mobility Specialist MS [facility] #:  [phone] Acute Rehab Office:  [phone]

## 2021-11-30 NOTE — Progress Notes (Signed)
[facility] PROGRESS NOTE  Patient: Courtney Gray COB:[id number]   PCP: Monico Blitz, MD DOB: [birth year]   DOA: 11/01/2021   DOS: 11/30/2021    Subjective: No nausea no vomiting.  No abdominal pain.  No fever no chills.  No shortness of breath.  Somewhat drowsy today.  Objective:  Vitals:   11/29/21 2128 11/30/21 0635 11/30/21 1010 11/30/21 1711  BP: (!) 131/55 (!) 140/46 (!) 148/71 131/78  Pulse:  73 67 77  Resp: 17 18 19 18   Temp: 99 F (37.2 C) 98.4 F (36.9 C) 98.4 F (36.9 C) 98.6 F (37 C)  TempSrc:   Oral Oral  SpO2: 100% 99% 97% 96%  Weight:      Height:       S1-S2 present. Bowel sound present. Clear to auscultation.  Assessment and plan: Currently awaiting placement.  Abdominal discomfort.  X-ray abdomen unremarkable. No further nausea or vomiting. Tolerating oral diet. Might consider Amitiza for IBS-C.  Author: Berle Mull, MD [facility] 11/30/2021 8:00 PM   If 7PM-7AM, please contact night-coverage at [email or web address]

## 2021-12-01 DIAGNOSIS — N186 End stage renal disease: Secondary | ICD-10-CM | POA: Diagnosis not present

## 2021-12-01 LAB — GLUCOSE, CAPILLARY
Glucose-Capillary: 140 mg/dL — ABNORMAL HIGH (ref 70–99)
Glucose-Capillary: 150 mg/dL — ABNORMAL HIGH (ref 70–99)
Glucose-Capillary: 100 mg/dL — ABNORMAL HIGH (ref 70–99)
Glucose-Capillary: 101 mg/dL — ABNORMAL HIGH (ref 70–99)

## 2021-12-01 MED ORDER — LUBIPROSTONE 8 MCG PO CAPS
8.0000 ug | ORAL_CAPSULE | Freq: Every day | ORAL | Status: DC
  Administered 2021-12-02: 8 ug via ORAL
  Filled 2021-12-01 (×3): qty 1

## 2021-12-01 MED ORDER — ASPIRIN 81 MG PO TBEC
81.0000 mg | DELAYED_RELEASE_TABLET | Freq: Every day | ORAL | Status: DC
  Administered 2021-12-01 – 2022-05-15 (×157): 81 mg via ORAL
  Filled 2021-12-01 (×159): qty 1

## 2021-12-01 MED ORDER — SIMETHICONE 80 MG PO CHEW
80.0000 mg | CHEWABLE_TABLET | Freq: Three times a day (TID) | ORAL | Status: DC
  Administered 2021-12-01 – 2021-12-07 (×17): 80 mg via ORAL
  Filled 2021-12-01 (×28): qty 1

## 2021-12-01 MED ORDER — CHLORHEXIDINE GLUCONATE CLOTH 2 % EX PADS
6.0000 | MEDICATED_PAD | Freq: Every day | CUTANEOUS | Status: DC
  Administered 2021-12-02 – 2021-12-08 (×6): 6 via TOPICAL

## 2021-12-01 MED ORDER — HEPARIN SODIUM (PORCINE) 5000 UNIT/ML IJ SOLN
5000.0000 [IU] | Freq: Three times a day (TID) | INTRAMUSCULAR | Status: DC
  Administered 2021-12-01 – 2022-01-14 (×121): 5000 [IU] via SUBCUTANEOUS
  Filled 2021-12-01 (×120): qty 1

## 2021-12-01 NOTE — Progress Notes (Signed)
Subjective: some complaints of reflux, she stated admit team to give meds also discussed with patient sit upright when eating food in bed instead of laying down which she is now.  Dialysis tomorrow On schedule  Objective Vital signs in last 24 hours: Vitals:   11/30/21 1711 11/30/21 2008 12/01/21 0619 12/01/21 0620  BP: 131/78 (!) 137/54 (!) 129/59 (!) 129/59  Pulse: 77 83 72 71  Resp: 18 18 18    Temp: 98.6 F (37 C) 98.7 F (37.1 C)  99.1 F (37.3 C)  TempSrc: Oral Oral    SpO2: 96% 99% 100% 100%  Weight:      Height:       Weight change:   Physical Exam: General: Alert obese female NAD Heart: RRR no MRG Lungs: CTA bilaterally anteriorly nonlabored breathing  nasal cannula oxygen Abdomen: Obese, soft NABS NT ND Extremities: No edema bilateral BKA Dialysis Access: Left IJ tunneled PermCath dressing dry intact   Dialysis Orders: MWF - [place] ---?transferring to [facility] 4hrs, BFR 400, DFR 600,  AVF, EDW 146kg, 3K/ 2Ca - Heparin 2500 units with HD - Hectorol 62mcg IV qHD, held as of 10/30/21       Problem/Plan: ESRD= HD MWF with abbreviated treatment 8/22 due to PermCath malfunction exchanged 8/22, un fortunately not working again 8/24 IR exchanged PermCath 8/24, problems again 8/25 required Cathflo needed  then 400 bfr documented ,will increase heparin on HD HD.  8/26 lab oK =K4.2 BUN 44 CR 6.79 and NA 135 CO2 21 Dialysis PermCath malfunction history as above  Hypertension/volume= noted on metoprolol 12.5 mg twice daily, does not appear to have significant excess volume no shortness of breath chronic oxygen Chronic respiratory failure with hypoxia on cannula oxygen at home = attempts  to wean oxygen down from 5 L but uses chronically Anemia -Hgb 9.0 <9.2 Aranesp q. Friday 150 mcg was increased increased, IV iron weekly for now since she is off IV antibiotics Secondary hyperparathyroidism -corrected calcium has been high vitamin D on hold follow-up trend, phosphorus  7.8<8.0 Renvela not compliant in hospital PAD= status post bilateral AKA for gangrene follow-up Dr. Stanford Breed, status post left middle finger amputation for gangrene Decubitus ulcers= plan per admit team wound care Disposition= Difficult situation recently=". Patient required 4 staff members and 2 hoyer pads for transfer into HD chair as OP."  Will not stay on dialysis for full treatment because of pain of multiple decubitus ulcers, less likely LTAC discharge   Ernest Haber, PA-C [place] [phone] 12/01/2021,12:35 PM  LOS: 30 days   Labs: Basic Metabolic Panel: Recent Labs  Lab 11/27/21 1452 11/28/21 0421 11/30/21 0748  NA 134* 132* 135  K 4.0 4.9 4.2  CL 97* 96* 99  CO2 25 20* 21*  GLUCOSE 133* 95 107*  BUN 40* 47* 44*  CREATININE 7.00* 7.68* 6.79*  CALCIUM 8.5* 8.8* 9.0  PHOS 7.3* 8.0* 7.8*   Liver Function Tests: Recent Labs  Lab 11/27/21 1452 11/28/21 0421 11/30/21 0748  ALBUMIN 2.2* 2.4* 2.3*   No results for input(s): "LIPASE", "AMYLASE" in the last 168 hours. No results for input(s): "AMMONIA" in the last 168 hours. CBC: Recent Labs  Lab 11/25/21 0814 11/25/21 0814 11/27/21 1452 11/28/21 0421 11/29/21 1810 11/30/21 0748  WBC 13.1*  --  14.0* 14.3* 16.4* 14.6*  NEUTROABS  --    < > 10.3* 10.2* 12.8* 10.5*  HGB 8.1*  --  8.3* 9.2* 8.8* 9.0*  HCT 26.7*  --  26.7*  29.1* 27.9* 28.6*  MCV 98.9  --  97.1 94.8 95.5 95.3  PLT 254  --  189 233 219 216   < > = values in this interval not displayed.   Cardiac Enzymes: No results for input(s): "CKTOTAL", "CKMB", "CKMBINDEX", "TROPONINI" in the last 168 hours. CBG: Recent Labs  Lab 11/30/21 1134 11/30/21 1705 11/30/21 2005 12/01/21 0724 12/01/21 1139  GLUCAP 108* 104* 118* 140* 150*    Studies/Results: DG Abd Portable 1V  Result Date: 11/29/2021 CLINICAL DATA:  Abdominal pain for 2 days, initial encounter EXAM: PORTABLE ABDOMEN - 1 VIEW COMPARISON:  11/13/2021 FINDINGS: Scattered  large and small bowel gas is noted. No free air is seen. No focal mass or abnormal calcifications are noted. No acute bony abnormality is seen. IMPRESSION: No acute abnormality noted. Electronically Signed   By: Inez Catalina M.D.   On: 11/29/2021 19:22   Medications:  ferric gluconate (FERRLECIT) IVPB 110 mL/hr at 11/26/21 1706    aspirin EC  81 mg Oral Daily   atorvastatin  10 mg Oral Daily   darbepoetin (ARANESP) injection - DIALYSIS  100 mcg Intravenous Q Fri-HD   feeding supplement  1 Container Oral TID BM   gabapentin  100 mg Oral Q1200   gabapentin  200 mg Oral QHS   heparin  5,000 Units Subcutaneous Q8H   insulin aspart  0-5 Units Subcutaneous QHS   insulin aspart  0-6 Units Subcutaneous TID WC   leptospermum manuka honey  1 Application Topical Daily   levothyroxine  100 mcg Oral Q0600   [START ON 12/02/2021] lubiprostone  8 mcg Oral Q breakfast   metoprolol tartrate  12.5 mg Oral BID   pantoprazole  40 mg Oral Daily   sevelamer carbonate  0.8 g Oral TID WC   simethicone  80 mg Oral TID AC & HS

## 2021-12-01 NOTE — Progress Notes (Signed)
Progress Note Patient: Courtney Gray RKY:[id number] DOB: 01-03-1971 DOA: 11/01/2021  DOS: the patient was seen and examined on 12/01/2021  Brief hospital course: 51 y.o. female with medical history significant of ESRD on HD TTS, recent bacteremia and hepatic abscess on long-term IV antibiotics, chronic HF PEF, PVD status post bilateral AKA, IIDM, morbid obesity, chronic sacral wound stage II, presented with increasing shortness of breath.   Patient underwent bilateral AKA recently including right AKA in May and left AKA on 7/5.  And patient was recently diagnosed with septic shock secondary to bacteremia and liver abscess, for which patient has been on cefepime and Flagyl and the last dose will be August 4.  Recently, since patient has bilateral AKA, she has had extreme difficulty to get access to outpatient HD center.  She never missed her dialysis however according to outpatient HD records, it takes at least 4 staff members and [facility] lift for patient to be seated in the dialysis chair.  Starting today, patient started to feel shortness of breath, no chest pain no swelling.  No cough no fever or chills. She is now awaiting placement for HD that takes stretcher and is hoping to find one locally. Assessment and Plan: ESRD on HD Nephrology consulted and managing hemodialysis. Patient noted during HD treatment to have catheter issues and hemodialysis stopped on 11/25/2021.  IR consulted underwent catheter exchange on 8/22. 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 HD catheter nonfunctioning again on 8/25.  Management per IR and nephrology. Need to find a place where she can get HD in stretcher TOC following and helping with placement.  Acute on chronic diastolic CHF with chronic hypoxemic respiratory failure Acute issues resolved. Secondary to uncontrolled hypertension, patient with signs of mild volume overload on presentation. Volume management per HD/nephrology.    Hypertensive emergency On admission blood pressure 183/53. -Was on Imdur, metoprolol.  Currently blood pressure is stable on 12.5 mg Lopressor twice daily.   Anemia of CKD -IV iron weekly per nephrology. -H&H stable.   Constipation Resolved.   Nausea/vomiting -Resolved. -Tolerating current diet.   Well-controlled insulin-dependent diabetes mellitus -Hemoglobin A1c 5.7 -SSI.   Recent septic shock secondary to Bacteroides, bacteremia and liver, and splenic abscess -It is noted that on recent admission, patient developed septic shock from bacteroides with septic emboli to spleen, initially on anticoagulation which was later held as splenic infarct was determined to be infectious and not a blood clot. -Patient receiving IV cefepime on HD and Flagyl which was completed 8/4. -Consulted IR to evaluate for abdominal drain that was in place IR seen and drain removed 8/9. -Patient noted to have missed ID clinic appointment on 11/01/2021. -Patient seen by ID no indication for further antibiotics at this time, IR to remove drain per ID, ID has since signed off. -Abdominal discomfort improved post drain removal.   PAD -Status post bilateral AKA for gangrene with plan to follow-up with vascular surgery in a month (Dr. Stanford Breed on 11/21/2021 for staple removal) -Patient is status post left middle finger amputation for gangrene during recent admission. -Vascular surgery informed of patient's admission.  Continue aspirin.   Hyperlipidemia -Statin.   Chronic respiratory failure with hypoxia -Patient noted to report uses 4 L/Min home O2 prior to admission. -Currently sats of 100 % on 4 L nasal cannula. -Wean O2.   Morbid obesity. Body mass index is 44.39 kg/m.  Placing the pt at higher risk of poor outcomes.   Right hip unstageable pressure injury.  Left flank stage II pressure ulcer. Both present on admission. Continue wound care recommendation.  LALM and Medihoney. Significant  improvement in ulcer seen on 8/27.    Hypomagnesemia. Replaced.   Leukocytosis. Etiology not clear. For now we will monitor.  Likely stress reaction.  Irritable bowel syndrome with constipation. Initiate Amitiza.    Subjective: Reports pain and stiffness on her right side area.  No nausea no vomiting no fever no chills.  Abdominal pain improving.  Physical Exam: Vitals:   11/30/21 1711 11/30/21 2008 12/01/21 0619 12/01/21 0620  BP: 131/78 (!) 137/54 (!) 129/59 (!) 129/59  Pulse: 77 83 72 71  Resp: 18 18 18    Temp: 98.6 F (37 C) 98.7 F (37.1 C)  99.1 F (37.3 C)  TempSrc: Oral Oral    SpO2: 96% 99% 100% 100%  Weight:      Height:       General: Appear in mild distress; no visible Abnormal Neck Mass Or lumps, Conjunctiva normal Cardiovascular: S1 and S2 Present, aortic systolic  Murmur, Respiratory: good respiratory effort, Bilateral Air entry present and CTA, no Crackles, no wheezes Abdomen: Bowel Sound present, Non tender  Extremities: Bilateral BKA. Neurology: alert and oriented to time, place, and person  Gait not checked due to patient safety concerns   Data Reviewed: I have ordered test including CBC and BMP    Family Communication: No one at bedside  Disposition: Status is: Inpatient Remains inpatient appropriate because: Need for placement for HD   Author: Berle Mull, MD 12/01/2021 3:30 PM  Please look on [email or web address] to find out who is on call.

## 2021-12-02 DIAGNOSIS — N186 End stage renal disease: Secondary | ICD-10-CM | POA: Diagnosis not present

## 2021-12-02 LAB — RENAL FUNCTION PANEL
Albumin: 2.2 g/dL — ABNORMAL LOW (ref 3.5–5.0)
Anion gap: 17 — ABNORMAL HIGH (ref 5–15)
BUN: 66 mg/dL — ABNORMAL HIGH (ref 6–20)
CO2: 22 mmol/L (ref 22–32)
Calcium: 8.8 mg/dL — ABNORMAL LOW (ref 8.9–10.3)
Chloride: 98 mmol/L (ref 98–111)
Creatinine, Ser: 9.89 mg/dL — ABNORMAL HIGH (ref 0.44–1.00)
GFR, Estimated: 4 mL/min — ABNORMAL LOW (ref >60)
Glucose, Bld: 188 mg/dL — ABNORMAL HIGH (ref 70–99)
Phosphorus: 9.3 mg/dL — ABNORMAL HIGH (ref 2.5–4.6)
Potassium: 4.2 mmol/L (ref 3.5–5.1)
Sodium: 137 mmol/L (ref 135–145)

## 2021-12-02 LAB — CBC
HCT: 25.2 % — ABNORMAL LOW (ref 36.0–46.0)
Hemoglobin: 7.8 g/dL — ABNORMAL LOW (ref 12.0–15.0)
MCH: 30.1 pg (ref 26.0–34.0)
MCHC: 31 g/dL (ref 30.0–36.0)
MCV: 97.3 fL (ref 80.0–100.0)
Platelets: 245 10*3/uL (ref 150–400)
RBC: 2.59 MIL/uL — ABNORMAL LOW (ref 3.87–5.11)
RDW: 15.9 % — ABNORMAL HIGH (ref 11.5–15.5)
WBC: 14.9 10*3/uL — ABNORMAL HIGH (ref 4.0–10.5)
nRBC: 0 % (ref 0.0–0.2)

## 2021-12-02 LAB — GLUCOSE, CAPILLARY
Glucose-Capillary: 93 mg/dL (ref 70–99)
Glucose-Capillary: 92 mg/dL (ref 70–99)
Glucose-Capillary: 117 mg/dL — ABNORMAL HIGH (ref 70–99)

## 2021-12-02 MED ORDER — HEPARIN SODIUM (PORCINE) 1000 UNIT/ML IJ SOLN
INTRAMUSCULAR | Status: AC
  Filled 2021-12-02: qty 4

## 2021-12-02 NOTE — Progress Notes (Signed)
Progress Note Patient: Courtney Gray EQA:[id number] DOB: [birth year] DOA: 11/01/2021  DOS: the patient was seen and examined on 12/02/2021  Brief hospital course: 51 y.o. female with medical history significant of ESRD on HD TTS, recent bacteremia and hepatic abscess on long-term IV antibiotics, chronic HF PEF, PVD status post bilateral AKA, IIDM, morbid obesity, chronic sacral wound stage II, presented with increasing shortness of breath.   Patient underwent bilateral AKA recently including right AKA in May and left AKA on 7/5.  And patient was recently diagnosed with septic shock secondary to bacteremia and liver abscess, for which patient has been on cefepime and Flagyl and the last dose will be August 4.  Recently, since patient has bilateral AKA, she has had extreme difficulty to get access to outpatient HD center.  She never missed her dialysis however according to outpatient HD records, it takes at least 4 staff members and [facility] lift for patient to be seated in the dialysis chair.  Starting today, patient started to feel shortness of breath, no chest pain no swelling.  No cough no fever or chills. She is now awaiting placement for HD that takes stretcher and is hoping to find one locally.  Vascular surgery is considering work-up for fistula creation on the right upper extremity. Orthopedic consulted for follow-up on recent amputation  Assessment and Plan: ESRD on HD Nephrology consulted and managing hemodialysis. Patient noted during HD treatment to have catheter issues and hemodialysis stopped on 11/25/2021.  IR consulted underwent catheter exchange on 8/22. 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 HD catheter nonfunctioning again on 8/25.  Management per IR and nephrology. Need to find a place where she can get HD in stretcher TOC following and helping with placement.  Acute on chronic diastolic CHF with chronic hypoxemic respiratory failure Acute issues  resolved. Secondary to uncontrolled hypertension, patient with signs of mild volume overload on presentation. Volume management per HD/nephrology.   Hypertensive emergency On admission blood pressure 183/53. -Was on Imdur, metoprolol.  Currently blood pressure is stable on 12.5 mg Lopressor twice daily.   Anemia of CKD -IV iron weekly per nephrology. -H&H stable.   Constipation Resolved.   Nausea/vomiting -Resolved. -Tolerating current diet.   Well-controlled insulin-dependent diabetes mellitus -Hemoglobin A1c 5.7 -SSI.   Recent septic shock secondary to Bacteroides, bacteremia and liver, and splenic abscess -It is noted that on recent admission, patient developed septic shock from bacteroides with septic emboli to spleen, initially on anticoagulation which was later held as splenic infarct was determined to be infectious and not a blood clot. -Patient receiving IV cefepime on HD and Flagyl which was completed 8/4. -Consulted IR to evaluate for abdominal drain that was in place IR seen and drain removed 8/9. -Patient noted to have missed ID clinic appointment on 11/01/2021. -Patient seen by ID no indication for further antibiotics at this time, IR to remove drain per ID, ID has since signed off. -Abdominal discomfort improved post drain removal.   PAD -Status post bilateral AKA for gangrene with plan to follow-up with vascular surgery in a month (Dr. Stanford Breed on 11/21/2021 for staple removal) -Patient is status post left middle finger amputation for gangrene during recent admission. -Vascular surgery informed of patient's admission.  Continue aspirin.   Hyperlipidemia -Statin.   Chronic respiratory failure with hypoxia -Patient noted to report uses 4 L/Min home O2 prior to admission. -Currently sats of 100 % on 4 L nasal cannula. -Wean O2.   Morbid obesity. Body  mass index is 44.39 kg/m.  Placing the pt at higher risk of poor outcomes.   Right hip unstageable pressure  injury. Left flank stage II pressure ulcer. Both present on admission. Continue wound care recommendation.  LALM and Medihoney. Significant improvement in ulcer seen on 8/27.    Hypomagnesemia. Replaced.   Leukocytosis. Etiology not clear. For now we will monitor.  Likely stress reaction.  Irritable bowel syndrome with constipation. Initiate Amitiza.  Recent amputation of the left middle finger. Surgery was in May 2023. Appears to have some poor healing with eschar creation. Orthopedic/hand surgery consulted to look at the wound again.  Consideration for AV fistula. Vascular surgery consulted. Left upper extremity catheter limited due to recent amputation surgery with poor healing as well as distal radius stenosis. Vein mapping for right upper extremity ordered by vascular surgery.  Subjective: Pain improving.  No nausea no vomiting no fever no chills.  No chest pain.  Oral intake improving.  Physical Exam: Vitals:   12/02/21 1000 12/02/21 1007 12/02/21 1300 12/02/21 1712  BP:  (!) 149/49  (!) 118/45  Pulse:    73  Resp:    17  Temp:  98.3 F (36.8 C) 98.3 F (36.8 C) 98.4 F (36.9 C)  TempSrc:  Oral    SpO2: 100%   97%  Weight:      Height:       General: Appear in mild distress; no visible Abnormal Neck Mass Or lumps, Conjunctiva normal Cardiovascular: S1 and S2 Present, aortic systolic  Murmur, Respiratory: good respiratory effort, Bilateral Air entry present and CTA, no Crackles, no wheezes Abdomen: Bowel Sound present, Non tender Extremities: Bilateral BKA Neurology: alert and oriented to Self, Place and time.  Data Reviewed: I have Reviewed nursing notes, Vitals, and Lab results since pt's last encounter. Pertinent lab results CBC and BMP   recheck CBC tomorrow.  Discussed patient's plan with vascular surgery as well as orthopedic surgery.  Family Communication: No one at bedside  Disposition: Status is: Inpatient Remains inpatient appropriate because:  Need for placement for HD   Author: Berle Mull, MD 12/02/2021 7:37 PM  Please look on [email or web address] to find out who is on call.

## 2021-12-02 NOTE — Consult Note (Addendum)
Hospital Consult    Reason for Consult:  permanent dialysis access Requesting Physician:  Lavina Hamman MD MRN #:  [id number]  History of Present Illness: Courtney Gray is a 51 y.o. female with a past medical history of ESRD on HD, recent bacteremia and hepatic abscess on long-term IV antibiotics, chronic HFpEF, DM II, morbid obesity, and bilateral AKAs.  Patient underwent right AKA on 08/14/2021, left middle finger amputation on 09/23/2021, and left AKA on 10/09/2021. She was recently diagnosed with septic shock secondary to bacteremia and liver abscess.  She finished a long-term course of cefepime and Flagyl on 11/08/2021.  She presented to the emergency room on 11/01/2021 with increasing shortness of breath.  Found to have mild fluid overload and required dialysis.  Patient has continued to have issues with left IJ tunneled catheter.  IR did catheter exchange on 8/22 and 8/24.  Required Cathflo on 8/25. Catheter is currently functioning and will attempt dialysis today. Her last dialysis session was cut short on Friday, 8/25.  She has a history of right brachiocephalic AV fistula that failed after infiltration twice in 2021.  Since then the patient has been catheter dependent and did not want a new fistula or graft.  Now she is open to new permanent access, since her catheter has been having issues.  The pt is on a statin for cholesterol management.  The pt is on a daily aspirin.   Other AC:   The pt is on metoprolol for hypertension.   The pt is diabetic.   Tobacco hx: Never  Past Medical History:  Diagnosis Date   Arthritis    Diabetes mellitus without complication ([place])    Dialysis complication, subsequent encounter    Gout    High cholesterol    Hypertension    Renal disorder     Past Surgical History:  Procedure Laterality Date   ABDOMINAL AORTOGRAM W/LOWER EXTREMITY N/A 08/07/2021   Procedure: ABDOMINAL AORTOGRAM W/LOWER EXTREMITY;  Surgeon: Broadus John, MD;  Location: [facility];  Service: Cardiovascular;  Laterality: N/A;   AMPUTATION Right 08/14/2021   Procedure: AMPUTATION ABOVE KNEE;  Surgeon: Marty Heck, MD;  Location: [facility];  Service: Vascular;  Laterality: Right;   AMPUTATION Left 09/23/2021   Procedure: AMPUTATION MIDDLE FINGER;  Surgeon: Sherilyn Cooter, MD;  Location: [facility];  Service: Orthopedics;  Laterality: Left;   AMPUTATION Left 10/09/2021   Procedure: LEFT ABOVE KNEE AMPUTATION;  Surgeon: Cherre Robins, MD;  Location: [facility];  Service: Vascular;  Laterality: Left;   AV FISTULA PLACEMENT     x2, unsuccessful.    BUBBLE STUDY  09/27/2021   Procedure: BUBBLE STUDY;  Surgeon: Lelon Perla, MD;  Location: [facility] ENDOSCOPY;  Service: Cardiovascular;;   IR FLUORO GUIDE CV LINE LEFT  12/14/2020   IR FLUORO GUIDE CV LINE LEFT  02/20/2021   IR FLUORO GUIDE CV LINE LEFT  09/11/2021   IR FLUORO GUIDE CV LINE LEFT  10/18/2021   IR FLUORO GUIDE CV LINE LEFT  11/26/2021   IR FLUORO GUIDE CV LINE LEFT  11/28/2021   IR FLUORO GUIDE CV LINE RIGHT  08/28/2020   IR FLUORO GUIDE CV LINE RIGHT  05/13/2021   IR FLUORO GUIDE CV LINE RIGHT  05/24/2021   IR PERC TUN PERIT CATH WO PORT S&I /IMAG  09/06/2020   IR PTA VENOUS EXCEPT DIALYSIS CIRCUIT  02/20/2021   IR PTA VENOUS EXCEPT DIALYSIS CIRCUIT  05/13/2021   IR PTA VENOUS  EXCEPT DIALYSIS CIRCUIT  11/28/2021   IR REMOVAL TUN CV CATH W/O FL  05/24/2021   IR REMOVAL TUN CV CATH W/O FL  10/15/2021   IR US GUIDE VASC ACCESS LEFT  09/06/2020   IR US GUIDE VASC ACCESS LEFT  09/11/2021   IR US GUIDE VASC ACCESS LEFT  10/18/2021   IR US GUIDE VASC ACCESS RIGHT  05/24/2021   IR US GUIDE VASC ACCESS RIGHT  09/10/2021   IR VENO/JUGULAR RIGHT  09/10/2021   IR VENOCAVAGRAM SVC  02/20/2021   IR VENOCAVAGRAM SVC  11/26/2021   TEE WITHOUT CARDIOVERSION N/A 09/27/2021   Procedure: TRANSESOPHAGEAL ECHOCARDIOGRAM (TEE);  Surgeon: Lelon Perla, MD;  Location: [facility] ENDOSCOPY;  Service: Cardiovascular;  Laterality: N/A;    Allergies   Allergen Reactions   Benzonatate Other (See Comments)    Hallucinations   Hydralazine Other (See Comments)    Hallucinations   Amoxicillin Itching and Nausea And Vomiting   Clonidine Other (See Comments)    Altered mental state, insomnia    Chlorhexidine Itching    Patient reports it is the CHG baths that cause her itching not the CHG used for caring for her IV/HD access.     Morphine Itching   Oxycodone Itching    Pt tolerated hospital admission 07/2021    Prior to Admission medications   Medication Sig Start Date End Date Taking? Authorizing Provider  acetaminophen (TYLENOL) 500 MG tablet Take 1,000 mg by mouth as needed for moderate pain.   Yes [provider]  Amino Acids-Protein Hydrolys (PRO-STAT RENAL CARE) LIQD Take 30 mLs by mouth 2 (two) times daily. 10/28/21  Yes [provider]  ascorbic acid (VITAMIN C) 500 MG tablet Take 500 mg by mouth daily.   Yes [provider]  atorvastatin (LIPITOR) 10 MG tablet Take 1 tablet (10 mg total) by mouth daily. Patient taking differently: Take 10 mg by mouth at bedtime. 11/28/20  Yes Emokpae, Courage, MD  bisacodyl (FLEET) 10 MG/30ML ENEM Place 10 mg rectally every other day as needed (constipation).   Yes [provider]  calcium carbonate (TUMS SMOOTHIES) 750 MG chewable tablet Chew 1,500 mg by mouth See admin instructions. Take 1500 mg with each meal and 1500 mg with each snack   Yes [provider]  Cholecalciferol 25 MCG (1000 UT) capsule Take 1,000 Units by mouth daily.   Yes [provider]  collagenase (SANTYL) 250 UNIT/GM ointment Apply 1 Application topically daily. Apply to right gluteal fold topically every day shift for wound healing.   Yes [provider]  diphenhydrAMINE (BENADRYL) 25 MG tablet Take 25 mg by mouth 2 (two) times daily as needed for allergies.   Yes [provider]  docusate sodium (COLACE) 100 MG capsule Take 100 mg by mouth 2 (two) times  daily.   Yes [provider]  doxycycline (VIBRAMYCIN) 100 MG capsule Take 100 mg by mouth 2 (two) times daily. 10/28/21  Yes [provider]  gabapentin (NEURONTIN) 100 MG capsule Take 1 capsule (100 mg total) by mouth 2 (two) times daily. 08/28/21 11/02/21 Yes Pahwani, Einar Grad, MD  levothyroxine (SYNTHROID) 100 MCG tablet Take 100 mcg by mouth daily.   Yes [provider]  loperamide (IMODIUM A-D) 2 MG tablet Take 4 mg by mouth as needed for diarrhea or loose stools.   Yes [provider]  melatonin 3 MG TABS tablet Take 3 mg by mouth at bedtime. 11/01/21  Yes [provider]  nitroGLYCERIN (NITROSTAT)  0.4 MG SL tablet Place 1 tablet (0.4 mg total) under the tongue every 5 (five) minutes as needed for chest pain. 05/24/21  Yes [place] Eric J, DO  oxyCODONE (OXY IR/ROXICODONE) 5 MG immediate release tablet Take 1-2 tablets (5-10 mg total) by mouth every 4 (four) hours as needed for moderate pain (pain score 4-6). Patient taking differently: Take 5 mg by mouth every 4 (four) hours as needed for moderate pain (pain score 4-6). 10/21/21  Yes Dessa Phi, DO  OXYGEN Inhale 4 L into the lungs See admin instructions. Oxygen at 4L hr via nasal cannula and will use portable oxygen machine when up in wheelchair. 10/28/21  Yes [provider]  pantoprazole (PROTONIX) 40 MG tablet Take 1 tablet (40 mg total) by mouth daily. 11/29/20  Yes Emokpae, Courage, MD  senna (SENOKOT) 8.6 MG tablet Take 2 tablets by mouth 2 (two) times daily.   Yes [provider]  simethicone (MYLICON) 161 MG chewable tablet Chew 125 mg by mouth in the morning and at bedtime.   Yes [provider]  tiZANidine (ZANAFLEX) 4 MG capsule Take 4 mg by mouth in the morning and at bedtime.   Yes [provider]    Social History   Socioeconomic History   Marital status: Single    Spouse name: Not on file   Number of children: Not on file   Years of education: Not  on file   Highest education level: Not on file  Occupational History   Not on file  Tobacco Use   Smoking status: Never   Smokeless tobacco: Never  Vaping Use   Vaping Use: Never used  Substance and Sexual Activity   Alcohol use: Never   Drug use: Never   Sexual activity: Not on file  Other Topics Concern   Not on file  Social History Narrative   Not on file   Social Determinants of Health   Financial Resource Strain: Not on file  Food Insecurity: Not on file  Transportation Needs: Not on file  Physical Activity: Not on file  Stress: Not on file  Social Connections: Not on file  Intimate Partner Violence: Not on file     Family History  Problem Relation Age of Onset   Diabetes Mother    Diabetes Father     ROS: [x]  Positive   [ ]  Negative   [ ]  All sytems reviewed and are negative  Cardiac: []  chest pain/pressure []  palpitations [x]  SOB lying flat []  DOE  Vascular: []  pain in legs while walking []  pain in legs at rest []  pain in legs at night []  non-healing ulcers []  hx of DVT []  swelling in legs  Pulmonary: []  asthma/wheezing [x]  home O2- requires 5 L chronically  Neurologic: []  hx of CVA []  mini stroke   Hematologic: []  hx of cancer  Endocrine:   [x]  diabetes []  thyroid disease  GI []  GERD  GU: [x]  CKD/renal failure [x]  HD--[]  M/W/F or []  T/T/S  Psychiatric: []  anxiety []  depression  Musculoskeletal: []  arthritis []  joint pain  Integumentary: []  rashes [x]  ulcers  Constitutional: []  fever  []  chills  Physical Examination  Vitals:   12/01/21 2029 12/02/21 0521  BP: 106/80 (!) 149/51  Pulse: 76 73  Resp: 18 18  Temp: 99.3 F (37.4 C) 98.4 F (36.9 C)  SpO2: 94% 100%   Body mass index is 44.39 kg/m.  General:  WDWN in NAD Gait: Not observed HENT: WNL, normocephalic Pulmonary: nonlabored, on 4L  supplemental O2 [place] Cardiac: regular, without murmur; without carotid bruit Abdomen:  soft, NT/ND; aortic pulse is not  palpable Skin: without rashes Vascular Exam/Pulses: 1+ radial pulses bilaterally. Unable to palpate brachial pulses. Left IJ tunneled permcath intact Extremities: without ischemic changes, without Gangrene , without cellulitis; with open wounds. Healed L middle finger amputation. Healed bilateral AKAs. Multiple decubitus ulcers Musculoskeletal: no muscle wasting or atrophy  Neurologic: A&O X 3; speech is fluent/normal Psychiatric:  The pt has Normal affect.  CBC    Component Value Date/Time   WBC 14.6 (H) 11/30/2021 0748   RBC 3.00 (L) 11/30/2021 0748   HGB 9.0 (L) 11/30/2021 0748   HCT 28.6 (L) 11/30/2021 0748   PLT 216 11/30/2021 0748   MCV 95.3 11/30/2021 0748   MCH 30.0 11/30/2021 0748   MCHC 31.5 11/30/2021 0748   RDW 15.9 (H) 11/30/2021 0748   LYMPHSABS 1.6 11/30/2021 0748   MONOABS 1.5 (H) 11/30/2021 0748   EOSABS 0.7 (H) 11/30/2021 0748   BASOSABS 0.1 11/30/2021 0748    BMET    Component Value Date/Time   NA 135 11/30/2021 0748   K 4.2 11/30/2021 0748   CL 99 11/30/2021 0748   CO2 21 (L) 11/30/2021 0748   GLUCOSE 107 (H) 11/30/2021 0748   BUN 44 (H) 11/30/2021 0748   CREATININE 6.79 (H) 11/30/2021 0748   CALCIUM 9.0 11/30/2021 0748   GFRNONAA 7 (L) 11/30/2021 0748   GFRAA 6 (L) 11/29/2019 0120    COAGS: Lab Results  Component Value Date   INR 1.3 (H) 10/09/2021   INR 1.3 (H) 09/10/2021   INR 1.3 (H) 07/10/2021     Non-Invasive Vascular Imaging:   Vein mapping pending   ASSESSMENT/PLAN: [name] with tunneled left IJ catheter in need of permanent dialysis access  -Patient has a history of infiltrated right brachiocephalic fistula, has not been functioning since 06/2019.  -She currently has a left IJ [facility] for dialysis on MWF, however it has been having issues and has required exchanges from IR team on 8/22 and 8/24. She will attempt to receive dialysis through the catheter today -Patient has 1+ radial pulses bilaterally. Will wait for vein  mapping results to come back to see if we can create a fistula.    Vicente Serene, PA-C [facility] [phone]   I have independently interviewed and examined patient and agree with PA assessment and plan above.  Difficult access history of failed right upper arm AV fistula now with poorly healing left middle finger amputation.  She is left-hand dominant.  I do not think left upper extremity access would be in her favor but she does have a failing catheter although it worked for dialysis today.  We will obtain vein mapping as well as a right upper extremity arterial duplex given the lack of palpable pulses at the wrist and concern for future ischemic issues with any access in the right upper extremity.  Certainly does not appear to be a good candidate for any groin access given her body habitus.  After vein mapping and right arm duplex I will discuss the options further with her possibly will need upper extremity venography to evaluate for central stenosis as well.  She demonstrates very good understanding.  I recommended hand surgical evaluation to the primary team.  Eda Paschal. Donzetta Matters, MD [facility] [place] Office: [phone] Pager: [phone]

## 2021-12-02 NOTE — Progress Notes (Signed)
[facility] Progress Note   Subjective:   Seen in room, very anxious this AM because of TDC issues. Reports she has a failed AVF but never had a new permanent access placed because she did not want anything to happen to her arms. She is now open to a vascular surgery consult. Feels slightly SOB this AM but thinks it is her anxiety. No CP, dizziness or palpitations.   Objective Vitals:   12/01/21 0620 12/01/21 1706 12/01/21 2029 12/02/21 0521  BP: (!) 129/59 (!) 113/47 106/80 (!) 149/51  Pulse: 71 67 76 73  Resp:  16 18 18   Temp: 99.1 F (37.3 C) 98.7 F (37.1 C) 99.3 F (37.4 C) 98.4 F (36.9 C)  TempSrc:  Oral Oral   SpO2: 100% 100% 94% 100%  Weight:      Height:       Physical Exam General: Alert female in NAD Heart: RRR, no murmurs, rubs or gallops Lungs: CTA bilaterally, mild tachypnea Abdomen: Obese abdomen, + BS Extremities: b/l AKA, no significant edema Dialysis Access: Left IJ tunneled PermCath dressing dry intact  Additional Objective Labs: Basic Metabolic Panel: Recent Labs  Lab 11/27/21 1452 11/28/21 0421 11/30/21 0748  NA 134* 132* 135  K 4.0 4.9 4.2  CL 97* 96* 99  CO2 25 20* 21*  GLUCOSE 133* 95 107*  BUN 40* 47* 44*  CREATININE 7.00* 7.68* 6.79*  CALCIUM 8.5* 8.8* 9.0  PHOS 7.3* 8.0* 7.8*   Liver Function Tests: Recent Labs  Lab 11/27/21 1452 11/28/21 0421 11/30/21 0748  ALBUMIN 2.2* 2.4* 2.3*   No results for input(s): "LIPASE", "AMYLASE" in the last 168 hours. CBC: Recent Labs  Lab 11/27/21 1452 11/28/21 0421 11/29/21 1810 11/30/21 0748  WBC 14.0* 14.3* 16.4* 14.6*  NEUTROABS 10.3* 10.2* 12.8* 10.5*  HGB 8.3* 9.2* 8.8* 9.0*  HCT 26.7* 29.1* 27.9* 28.6*  MCV 97.1 94.8 95.5 95.3  PLT 189 233 219 216   Blood Culture    Component Value Date/Time   SDES BLOOD LEFT ANTECUBITAL 10/16/2021 1534   SPECREQUEST  10/16/2021 1534    BOTTLES DRAWN AEROBIC AND ANAEROBIC Blood Culture results may not be optimal due to an  inadequate volume of blood received in culture bottles   CULT  10/16/2021 1534    NO GROWTH 5 DAYS Performed at [facility], [place] [address], [place], [place] [address]    REPTSTATUS 10/21/2021 FINAL 10/16/2021 1534    Cardiac Enzymes: No results for input(s): "CKTOTAL", "CKMB", "CKMBINDEX", "TROPONINI" in the last 168 hours. CBG: Recent Labs  Lab 12/01/21 0724 12/01/21 1139 12/01/21 1704 12/01/21 2040 12/02/21 0716  GLUCAP 140* 150* 100* 101* 93   Iron Studies: No results for input(s): "IRON", "TIBC", "TRANSFERRIN", "FERRITIN" in the last 72 hours. @lablastinr3 @ Studies/Results: No results found. Medications:  ferric gluconate (FERRLECIT) IVPB 110 mL/hr at 11/26/21 1706    aspirin EC  81 mg Oral Daily   atorvastatin  10 mg Oral Daily   Chlorhexidine Gluconate Cloth  6 each Topical Q0600   darbepoetin (ARANESP) injection - DIALYSIS  100 mcg Intravenous Q Fri-HD   feeding supplement  1 Container Oral TID BM   gabapentin  100 mg Oral Q1200   gabapentin  200 mg Oral QHS   heparin  5,000 Units Subcutaneous Q8H   insulin aspart  0-5 Units Subcutaneous QHS   insulin aspart  0-6 Units Subcutaneous TID WC   leptospermum manuka honey  1 Application Topical Daily   levothyroxine  100  mcg Oral Q0600   lubiprostone  8 mcg Oral Q breakfast   metoprolol tartrate  12.5 mg Oral BID   pantoprazole  40 mg Oral Daily   sevelamer carbonate  0.8 g Oral TID WC   simethicone  80 mg Oral TID AC & HS    OP Dialysis Orders: MWF - [facility]  4hrs, BFR 400, DFR 600,  AVF, EDW 146kg, 3K/ 2Ca - Heparin 2500 units with HD - Hectorol 75mcg IV qHD, held as of 10/30/21  Assessment/Plan: ESRD: HD MWF with abbreviated treatment 8/22 due to PermCath malfunction exchanged 8/22, un fortunately not working again 8/24 IR exchanged PermCath 8/24, problems again 8/25 required Cathflo needed  then 400 bfr documented ,will increase heparin on HD HD.  2. Dialysis PermCath malfunction: see  above. IR thinks TDC is positional, did not have fibrin at last exchange. Discussed the need for a long term solution and patient does agree to a vascular surgery consult. Ordered vein mapping and Vvs consult today.  3. Hypertension/volume: noted on metoprolol 12.5 mg twice daily, does not appear to have significant excess volume. On O2 chronically 4. Chronic respiratory failure with hypoxia on cannula oxygen at home: on 5L O2 at baseline 5. Anemia: Hgb 9.0. Aranesp dose increased. Continue weekly iron 6. Secondary hyperparathyroidism: corrected calcium has been high, vitamin D on hold. Phos elevated, on renvela but history of noncompliance 7. PAD: status post bilateral AKA for gangrene follow-up Dr. Stanford Breed, status post left middle finger amputation for gangrene 8. Disposition: Difficult situation recently=". Patient required 4 staff members and 2 hoyer pads for transfer into HD chair as OP."  Could not stay on dialysis for full treatment because of pain of multiple decubitus ulcers, likely LTAC discharge      Anice Paganini, PA-C 12/02/2021, 8:27 AM  [place] Pager: [phone]

## 2021-12-02 NOTE — Progress Notes (Signed)
[place] and spoke to [place]. Update requested regarding determination from nephrologist. Maudie Mercury to f/u with provider/clinic to try to obtain an update on referral for stretcher HD.   Melven Sartorius Renal Navigator [phone]

## 2021-12-03 ENCOUNTER — Inpatient Hospital Stay (HOSPITAL_COMMUNITY): Payer: 59

## 2021-12-03 DIAGNOSIS — N186 End stage renal disease: Secondary | ICD-10-CM

## 2021-12-03 LAB — CBC WITH DIFFERENTIAL/PLATELET
Abs Immature Granulocytes: 0.31 10*3/uL — ABNORMAL HIGH (ref 0.00–0.07)
Basophils Absolute: 0.1 10*3/uL (ref 0.0–0.1)
Basophils Relative: 1 %
Eosinophils Absolute: 1.1 10*3/uL — ABNORMAL HIGH (ref 0.0–0.5)
Eosinophils Relative: 7 %
HCT: 27.4 % — ABNORMAL LOW (ref 36.0–46.0)
Hemoglobin: 8.5 g/dL — ABNORMAL LOW (ref 12.0–15.0)
Immature Granulocytes: 2 %
Lymphocytes Relative: 10 %
Lymphs Abs: 1.5 10*3/uL (ref 0.7–4.0)
MCH: 29.8 pg (ref 26.0–34.0)
MCHC: 31 g/dL (ref 30.0–36.0)
MCV: 96.1 fL (ref 80.0–100.0)
Monocytes Absolute: 2 10*3/uL — ABNORMAL HIGH (ref 0.1–1.0)
Monocytes Relative: 13 %
Neutro Abs: 10.5 10*3/uL — ABNORMAL HIGH (ref 1.7–7.7)
Neutrophils Relative %: 67 %
Platelets: 223 10*3/uL (ref 150–400)
RBC: 2.85 MIL/uL — ABNORMAL LOW (ref 3.87–5.11)
RDW: 15.8 % — ABNORMAL HIGH (ref 11.5–15.5)
WBC: 15.6 10*3/uL — ABNORMAL HIGH (ref 4.0–10.5)
nRBC: 0 % (ref 0.0–0.2)

## 2021-12-03 LAB — GLUCOSE, CAPILLARY
Glucose-Capillary: 82 mg/dL (ref 70–99)
Glucose-Capillary: 153 mg/dL — ABNORMAL HIGH (ref 70–99)
Glucose-Capillary: 106 mg/dL — ABNORMAL HIGH (ref 70–99)
Glucose-Capillary: 92 mg/dL (ref 70–99)

## 2021-12-03 MED ORDER — LUBIPROSTONE 8 MCG PO CAPS
8.0000 ug | ORAL_CAPSULE | ORAL | Status: DC
  Administered 2021-12-05 – 2021-12-07 (×2): 8 ug via ORAL
  Filled 2021-12-03 (×3): qty 1

## 2021-12-03 MED ORDER — DARBEPOETIN ALFA 150 MCG/0.3ML IJ SOSY
150.0000 ug | PREFILLED_SYRINGE | INTRAMUSCULAR | Status: DC
Start: 2021-12-09 — End: 2021-12-08

## 2021-12-03 MED ORDER — DARBEPOETIN ALFA 100 MCG/0.5ML IJ SOSY: 100.0000 ug | PREFILLED_SYRINGE | INTRAMUSCULAR | Status: DC

## 2021-12-03 MED ORDER — LUBIPROSTONE 8 MCG PO CAPS: 8.0000 ug | ORAL_CAPSULE | ORAL | Status: DC

## 2021-12-03 MED ORDER — SEVELAMER CARBONATE 0.8 G PO PACK
1.6000 g | PACK | Freq: Three times a day (TID) | ORAL | Status: DC
  Administered 2021-12-03 – 2021-12-18 (×29): 1.6 g via ORAL
  Filled 2021-12-03 (×46): qty 2

## 2021-12-03 NOTE — Progress Notes (Signed)
Physical Therapy Treatment Patient Details Name: Courtney Gray MRN: [id number] DOB: 1970/08/29 Today's Date: 12/03/2021   History of Present Illness Pt adm 7/28 with acute pulmonary edema. 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    PT Comments    Pt noticeably laying in an increased supine position with R ischium elevated. Pt able to teach back why she is in the positioning and demonstrate different positioning to promote wound healing. Pt initially upset that she was getting PT immediately prior to Wound Care and inquires to why we would not be coming in the afternoon as she is doing nothing then. Educated on probability of increased pain with wound care and wanting to maximize therapy while pain was low. With placing pad pt with increased R LE pain, requesting and receiving pain medication from RN. Pt requires modA for pad placement and removal. Pt able to lift to wheelchair with increased pain. Assisted pt by rolling her to hallway, but pt unable to propel wheelchair with R UE because it caused increased pressure on her wound. Pt tolerates a total of approximately 15 minutes in chair before lifting back to bed for Hydrotherapy. Pt visibly upset with limited amount of participation in activities she could accomplish just weeks ago. Provided increased encouragement about wound healing. D/c plans remain appropriate. PT will continue to follow acutely.   Recommendations for follow up therapy are one component of a multi-disciplinary discharge planning process, led by the attending physician.  Recommendations may be updated based on patient status, additional functional criteria and insurance authorization.  Follow Up Recommendations  Acute inpatient rehab (3hours/day) Can patient physically be transported by private vehicle: No   Assistance Recommended at Discharge Frequent or constant Supervision/Assistance   Patient can return home with the following Two people to help with walking and/or transfers;Two people to help with bathing/dressing/bathroom;Assist for transportation;Assistance with cooking/housework;Help with stairs or ramp for entrance   Equipment Recommendations  Wheelchair (measurements PT);Wheelchair cushion (measurements PT);Other (comment);Hospital bed       Precautions / Restrictions Precautions Precautions: Fall Precaution Comments: bil AKA, 3L O2 at baseline, LUQ drain Restrictions Weight Bearing Restrictions: Yes RLE Weight Bearing: Non weight bearing LLE Weight Bearing: Non weight bearing Other Position/Activity Restrictions: minimized pushing/pulling and weight through L hand, dressing now removed     Mobility  Bed Mobility Overal bed mobility: Needs Assistance Bed Mobility: Rolling Rolling: Min assist, Mod assist         General bed mobility comments: pt able to initiate roll however requires modA for coming all the way over into sidelying for placement and removal of lift pad    Transfers Overall transfer level: Needs assistance   Transfers: Bed to chair/wheelchair/BSC             General transfer comment: lifted with [place] to wheelchair Transfer via Lift Equipment: [place] (with amputee lift pad)         Information systems manager mobility: Yes Wheelchair propulsion: Left upper extremity Wheelchair parts: Needs assistance Distance: 3 Wheelchair Assistance Details (indicate cue type and reason): unable to use both UE due to increased pain in R ischium with increased pressure of the chair       Balance Overall balance assessment: Needs assistance Sitting-balance support: No upper extremity supported Sitting balance-[name] Scale: Fair Sitting balance - Comments: able to balance and shift weight  in the wheelchair  Cognition Arousal/Alertness: Awake/alert Behavior  During Therapy: WFL for tasks assessed/performed Overall Cognitive Status: Within Functional Limits for tasks assessed                                 General Comments: limited understanding of wound process, and provided education on wound evolution and associated progression of pain which becomes limiting, of activities that she was able to do without pain previously        Exercises General Exercises - Lower Extremity Hip ABduction/ADduction: Supine, Both, 20 reps Straight Leg Raises: Supine, Both, 20 reps    General Comments General comments (skin integrity, edema, etc.): VSS on 3L O2 via [place]      Pertinent Vitals/Pain Pain Assessment Pain Assessment: Faces Faces Pain Scale: Hurts whole lot Pain Location: R-ischial wound Pain Descriptors / Indicators: Discomfort, Guarding, Grimacing, Moaning, Sore, Sharp, Throbbing Pain Intervention(s): Limited activity within patient's tolerance, Monitored during session, Repositioned     PT Goals (current goals can now be found in the care plan section) Acute Rehab PT Goals Patient Stated Goal: ultimately wants to be modified independent at w/c level PT Goal Formulation: With patient Time For Goal Achievement: 12/03/21 Potential to Achieve Goals: Fair Progress towards PT goals: Progressing toward goals    Frequency    Min 3X/week      PT Plan Current plan remains appropriate       AM-PAC PT "6 Clicks" Mobility   Outcome Measure  Help needed turning from your back to your side while in a flat bed without using bedrails?: A Lot Help needed moving from lying on your back to sitting on the side of a flat bed without using bedrails?: Total Help needed moving to and from a bed to a chair (including a wheelchair)?: Total Help needed standing up from a chair using your arms (e.g., wheelchair or bedside chair)?: Total Help needed to walk in hospital room?: Total Help needed climbing 3-5 steps with a railing? : Total 6  Click Score: 7    End of Session Equipment Utilized During Treatment: Oxygen Activity Tolerance: Patient tolerated treatment well Patient left: in bed;with call bell/phone within reach (Wound Care present for hydrotherapy) Nurse Communication: Mobility status PT Visit Diagnosis: Other abnormalities of gait and mobility (R26.89);Muscle weakness (generalized) (M62.81) Pain - Right/Left: Right Pain - part of body: Leg     Time: 1110-1204 PT Time Calculation (min) (ACUTE ONLY): 54 min  Charges:  $Therapeutic Activity: 23-37 mins                     Catheline Hixon B. Migdalia Dk PT, DPT Acute Rehabilitation Services Please use secure chat or  Call Office [phone]    [place] 12/03/2021, 1:00 PM

## 2021-12-03 NOTE — TOC Progression Note (Signed)
Transition of Care [facility]) - Initial/Assessment Note    Patient Details  Name: Courtney Gray MRN: [id number] Date of Birth: 01-24-1971  [facility]) CM/SW Contact:    Milinda Antis, [place] Phone Number: 12/03/2021, 8:56 AM  Clinical Narrative:                 Pending an outpatient dialysis clinic that provides stretcher dialysis.  Renal Navigator following and working to locate a facility.    TOC will continue to follow .   Expected Discharge Plan: Long Term Nursing Home Barriers to Discharge: No SNF bed, Transportation, Waiting for outpatient dialysis   Patient Goals and CMS Choice Patient states their goals for this hospitalization and ongoing recovery are:: To be at a facility close to her daughter      Expected Discharge Plan and Services Expected Discharge Plan: [place]       Living arrangements for the past 2 months: Single Family Home                                      Prior Living Arrangements/Services Living arrangements for the past 2 months: Single Family Home Lives with:: Adult Children Patient language and need for interpreter reviewed:: Yes        Need for Family Participation in Patient Care: Yes (Comment) Care giver support system in place?: No (comment)   Criminal Activity/Legal Involvement Pertinent to Current Situation/Hospitalization: No - Comment as needed  Activities of Daily Living Home Assistive Devices/Equipment: Civil Service fast streamer ADL Screening (condition at time of admission) Patient's cognitive ability adequate to safely complete daily activities?: No Is the patient deaf or have difficulty hearing?: No Does the patient have difficulty seeing, even when wearing glasses/contacts?: No Does the patient have difficulty concentrating, remembering, or making decisions?: No Patient able to express need for assistance with ADLs?: Yes Does the patient have difficulty dressing or bathing?: Yes Independently performs  ADLs?: No Does the patient have difficulty walking or climbing stairs?: Yes Weakness of Legs: Both Weakness of Arms/Hands: Both  Permission Sought/Granted Permission sought to share information with : Other (comment) Permission granted to share information with : Yes, Verbal Permission Granted  Share Information with NAME: Garnett,antoinette (Daughter)   [phone]  Permission granted to share info w AGENCY: SNF        Emotional Assessment Appearance:: Appears older than stated age Attitude/Demeanor/Rapport: Engaged Affect (typically observed): Tearful/Crying, Accepting Orientation: : Oriented to Situation, Oriented to  Time, Oriented to Place, Oriented to Self Alcohol / Substance Use: Not Applicable Psych Involvement: No (comment)  Admission diagnosis:  End stage renal disease ([place]) [N18.6] Fluid overload [E87.70] Obesity with serious comorbidity, unspecified classification, unspecified obesity type [E66.9] Pressure injury of right buttock, stage 2 (HCC) [L89.312] Patient Active Problem List   Diagnosis Date Noted   Hypertension    Type 2 diabetes mellitus with hyperlipidemia ([place])    Decubitus ulcer of sacral region, stage 2 ([place])    Class 3 obesity ([place])    Bacteremia    PVD (peripheral vascular disease) ([place])    Hepatic abscess    Pressure injury of skin 09/13/2021   Hx of AKA (above knee amputation), right ([place])    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   Lactic acidosis 09/10/2021   Mixed diabetic hyperlipidemia associated with type 2 diabetes mellitus ([place]) 09/10/2021   Coronary artery disease involving native coronary artery of native heart  without angina pectoris 09/10/2021   Elevated troponin level not due myocardial infarction 09/10/2021   Hypotension 09/10/2021   Dry gangrene (HCC)    Pain 08/03/2021   Ischemic foot pain at rest 08/03/2021   Hypoglycemia 07/10/2021   Volume overload 07/09/2021   Leukocytosis 06/11/2021   Acute gout 06/11/2021   Hyperkalemia  05/21/2021   Hemorrhoids    Hyperlipidemia 05/18/2021   Rectal bleeding 05/17/2021   Morbid obesity with BMI of 60.0-69.9, adult ([place]) 05/17/2021   NSTEMI (non-ST elevated myocardial infarction) ([place]) 05/16/2021   Essential hypertension 09/01/2020   End stage renal disease ([place]) 09/01/2020   Chronic respiratory failure with hypoxia ([place]) 09/01/2020   Hypertensive urgency 11/28/2019   Uncontrolled type 2 diabetes mellitus with hypoglycemia, with long-term current use of insulin ([place]) 11/28/2019   Anemia of chronic disease 11/28/2019   PCP:  Monico Blitz, MD Pharmacy:   [place], [place] [address] [place] [address] Phone: [phone] Fax: [phone]  [facility] [address] [place] [address] Phone: [phone] Fax: [phone]  [place], [place] - [address] [place] [address] Phone: [phone] Fax: [phone]     Social Determinants of Health ([place]) Interventions    Readmission Risk Interventions    09/16/2021    4:36 PM 08/28/2021   11:50 AM 07/12/2021   12:38 PM  Readmission Risk Prevention Plan  Transportation Screening Complete Complete Complete  Medication Review (RN Care Manager)  Complete Complete  PCP or Specialist appointment within 3-5 days of discharge  Complete Complete  HRI or Home Care Consult Complete Complete Complete  SW Recovery Care/Counseling Consult Complete Complete Complete  Palliative Care Screening  Not Applicable Not Applicable  Skilled Nursing Facility Complete Not Applicable Complete

## 2021-12-03 NOTE — Progress Notes (Signed)
      Pending HD via [facility] and vein mapping to discover weather or not she has new access options.   Pending vein mapping and right arm duplex  Roxy Horseman PA-C

## 2021-12-03 NOTE — Progress Notes (Signed)
[facility] Progress Note   Subjective:   HD went well yesterday, no issues with TDC reported. Vein mapping pending. Pt denies SOB, CP, dizziness, abdominal pain and nausea.   Objective Vitals:   12/02/21 1300 12/02/21 1712 12/02/21 2158 12/03/21 0616  BP:  (!) 118/45 139/60 (!) 118/30  Pulse:  73 72 70  Resp:  17 18 18   Temp: 98.3 F (36.8 C) 98.4 F (36.9 C) 99.2 F (37.3 C) 98.3 F (36.8 C)  TempSrc:   Oral Oral  SpO2:  97% 100% 100%  Weight:      Height:       Physical Exam General: Alert female in NAD Heart: RRR, no murmurs, rubs or gallops Lungs: CTA bilaterally, mild tachypnea Abdomen: Obese abdomen, + BS Extremities: b/l AKA, no significant edema Dialysis Access: Left IJ tunneled PermCath dressing dry intact  Additional Objective Labs: Basic Metabolic Panel: Recent Labs  Lab 11/28/21 0421 11/30/21 0748 12/02/21 0801  NA 132* 135 137  K 4.9 4.2 4.2  CL 96* 99 98  CO2 20* 21* 22  GLUCOSE 95 107* 188*  BUN 47* 44* 66*  CREATININE 7.68* 6.79* 9.89*  CALCIUM 8.8* 9.0 8.8*  PHOS 8.0* 7.8* 9.3*   Liver Function Tests: Recent Labs  Lab 11/28/21 0421 11/30/21 0748 12/02/21 0801  ALBUMIN 2.4* 2.3* 2.2*   No results for input(s): "LIPASE", "AMYLASE" in the last 168 hours. CBC: Recent Labs  Lab 11/28/21 0421 11/29/21 1810 11/30/21 0748 12/02/21 1022 12/03/21 0430  WBC 14.3* 16.4* 14.6* 14.9* 15.6*  NEUTROABS 10.2* 12.8* 10.5*  --  10.5*  HGB 9.2* 8.8* 9.0* 7.8* 8.5*  HCT 29.1* 27.9* 28.6* 25.2* 27.4*  MCV 94.8 95.5 95.3 97.3 96.1  PLT 233 219 216 245 223   Blood Culture    Component Value Date/Time   SDES BLOOD LEFT ANTECUBITAL 10/16/2021 1534   SPECREQUEST  10/16/2021 1534    BOTTLES DRAWN AEROBIC AND ANAEROBIC Blood Culture results may not be optimal due to an inadequate volume of blood received in culture bottles   CULT  10/16/2021 1534    NO GROWTH 5 DAYS Performed at [facility], [place] [address], [place],  [place] [address]    REPTSTATUS 10/21/2021 FINAL 10/16/2021 1534    Cardiac Enzymes: No results for input(s): "CKTOTAL", "CKMB", "CKMBINDEX", "TROPONINI" in the last 168 hours. CBG: Recent Labs  Lab 12/01/21 2040 12/02/21 0716 12/02/21 1710 12/02/21 2158 12/03/21 0729  GLUCAP 101* 93 92 117* 82   Iron Studies: No results for input(s): "IRON", "TIBC", "TRANSFERRIN", "FERRITIN" in the last 72 hours. @lablastinr3 @ Studies/Results: No results found. Medications:  ferric gluconate (FERRLECIT) IVPB Stopped (12/02/21 1318)    aspirin EC  81 mg Oral Daily   atorvastatin  10 mg Oral Daily   Chlorhexidine Gluconate Cloth  6 each Topical Q0600   darbepoetin (ARANESP) injection - DIALYSIS  100 mcg Intravenous Q Fri-HD   feeding supplement  1 Container Oral TID BM   gabapentin  100 mg Oral Q1200   gabapentin  200 mg Oral QHS   heparin  5,000 Units Subcutaneous Q8H   insulin aspart  0-5 Units Subcutaneous QHS   insulin aspart  0-6 Units Subcutaneous TID WC   leptospermum manuka honey  1 Application Topical Daily   levothyroxine  100 mcg Oral Q0600   lubiprostone  8 mcg Oral Q breakfast   metoprolol tartrate  12.5 mg Oral BID   pantoprazole  40 mg Oral Daily   sevelamer carbonate  0.8 g Oral TID WC   simethicone  80 mg Oral TID AC & HS    Dialysis Orders: MWF - [place]  4hrs, BFR 400, DFR 600,  AVF, EDW 146kg, 3K/ 2Ca - Heparin 2500 units with HD - Hectorol 33mcg IV qHD, held as of 10/30/21    Assessment/Plan: ESRD: HD MWF with abbreviated treatment 8/22 due to PermCath malfunction exchanged 8/22, unfortunately not working again 8/24 IR exchanged PermCath 8/24, problems again 8/25 required Cathflo. Graf did work well yesterday.  2. Dialysis PermCath malfunction: see above. IR thinks TDC is positional, did not have fibrin at last exchange. Discussed the need for a long term solution and patient does agree to a vascular surgery consult. Ordered vein mapping and VVS consult  3.  Hypertension/volume: noted on metoprolol 12.5 mg twice daily, does not appear to have significant excess volume. On O2 chronically. Wide variation in bed weights 4. Chronic respiratory failure with hypoxia on cannula oxygen at home: on 5L O2 at baseline 5. Anemia: Hgb 8.5. Aranesp dose increased. Continue weekly iron and will recheck iron levels.  6. Secondary hyperparathyroidism: corrected calcium has been high, vitamin D on hold. Phos elevated, on renvela, increasing dose 7. PAD: status post bilateral AKA for gangrene follow-up Dr. Stanford Breed, status post left middle finger amputation for gangrene 8. Disposition: Difficult situation recently: Patient required 4 staff members and 2 hoyer pads for transfer into HD chair as OP. Could not stay on dialysis for full treatment because of pain of multiple decubitus ulcers, SW working on Marine scientist HD  Anice Paganini, PA-C 12/03/2021, 8:54 AM  [place] Pager: [phone]

## 2021-12-03 NOTE — Progress Notes (Incomplete)
{  Select Note:[id number]} 

## 2021-12-03 NOTE — Progress Notes (Signed)
Progress Note Patient: Courtney Gray NIO:[id number] DOB: 03-Sep-1970 DOA: 11/01/2021  DOS: the patient was seen and examined on 12/03/2021  Brief hospital course: 51 y.o. female with medical history significant of ESRD on HD TTS, recent bacteremia and hepatic abscess on long-term IV antibiotics, chronic HF PEF, PVD status post bilateral AKA, IIDM, morbid obesity, chronic sacral wound stage II, presented with increasing shortness of breath.   Patient underwent bilateral AKA recently including right AKA in May and left AKA on 7/5.  And patient was recently diagnosed with septic shock secondary to bacteremia and liver abscess, for which patient has been on cefepime and Flagyl and the last dose will be August 4.  Recently, since patient has bilateral AKA, she has had extreme difficulty to get access to outpatient HD center.  She never missed her dialysis however according to outpatient HD records, it takes at least 4 staff members and [facility] lift for patient to be seated in the dialysis chair.  Starting today, patient started to feel shortness of breath, no chest pain no swelling.  No cough no fever or chills. She is now awaiting placement for HD that takes stretcher and is hoping to find one locally.  Vascular surgery is considering work-up for fistula creation on the right upper extremity. Orthopedic Dr. Morrie Sheldon consulted for follow-up on recent amputation  Assessment and Plan: ESRD on HD Nephrology consulted and managing hemodialysis. Patient noted during HD treatment to have catheter issues and hemodialysis stopped on 11/25/2021.  IR consulted underwent catheter exchange on 8/22. Required PTA of chronic fibrin sheath of left brachiocephalic and SVC 8/18. HD catheter nonfunctioning again on 8/25.  Management per IR and nephrology. Need to find a place where she can get HD in stretcher. TOC following and helping with placement.  Acute on chronic diastolic CHF with chronic hypoxemic respiratory  failure Acute issues resolved. Secondary to uncontrolled hypertension, patient with signs of mild volume overload on presentation. Volume management per HD/nephrology.   Hypertensive emergency On admission blood pressure 183/53. Was on Imdur, metoprolol.  Currently blood pressure is stable on 12.5 mg Lopressor twice daily.   Anemia of CKD IV iron weekly per nephrology. H&H stable.   Well-controlled insulin-dependent diabetes mellitus Hemoglobin A1c 5.7 SSI.   Recent septic shock secondary to Bacteroides, bacteremia and liver, and splenic abscess It is noted that on recent admission, patient developed septic shock from bacteroides with septic emboli to spleen, initially on anticoagulation which was later held as splenic infarct was determined to be infectious and not a blood clot. Patient receiving IV cefepime on HD and Flagyl which was completed 8/4. Consulted IR to evaluate for abdominal drain that was in place IR seen and drain removed 8/9. Patient noted to have missed ID clinic appointment on 11/01/2021. Patient seen by ID no indication for further antibiotics at this time, IR to remove drain per ID, ID has since signed off. Abdominal discomfort improved post drain removal.  Leukocytosis. Gradually trending upwards.  Etiology not clear.  Patient does not have any new symptoms.  If it goes more than 20,000 patient will require further work-up and imaging.   PAD Status post bilateral AKA for gangrene with plan to follow-up with vascular surgery in a month (Dr. Stanford Breed on 11/21/2021 for staple removal) Patient is status post left middle finger amputation for gangrene during recent admission. Vascular surgery following. Continue aspirin.   Hyperlipidemia Statin.   Chronic respiratory failure with hypoxia Patient noted to report uses 4 L/Min home O2 prior to  admission. Currently sats of 100 % on 4 L nasal cannula. Wean O2.   Morbid obesity. Body mass index is 44.39 kg/m.   Placing the pt at higher risk of poor outcomes.   Right hip unstageable pressure injury. Left flank stage II pressure ulcer. Both present on admission. Continue wound care recommendation.  LALM and Medihoney. Significant improvement in ulcer seen on 8/27.    Hypomagnesemia. Replaced.   Leukocytosis. Etiology not clear. For now we will monitor.  Likely stress reaction.  Irritable bowel syndrome with constipation. Intractable nausea and vomiting Initiate Amitiza.  Recent amputation of the left middle finger. Surgery was in May 2023. Appears to have some poor healing with eschar creation. Orthopedic/hand surgery consulted to look at the wound again. Orthopedic was consulted-currently per patient recommendation is for observation.  Consideration for AV fistula. Vascular surgery consulted. Left upper extremity catheter limited due to recent amputation surgery with poor healing as well as distal radius stenosis. Vein mapping for right upper extremity ordered by vascular surgery.  Subjective: No pain.  No nausea no vomiting.  No fever no chills.  No chest pain.  Wants to take Amitiza every other day.  Physical Exam: Vitals:   12/02/21 2158 12/03/21 0616 12/03/21 0940 12/03/21 1638  BP: 139/60 (!) 118/30 (!) 138/50 (!) 149/62  Pulse: 72 70 67 66  Resp: 18 18 18 18   Temp: 99.2 F (37.3 C) 98.3 F (36.8 C) 98.8 F (37.1 C) 98.8 F (37.1 C)  TempSrc: Oral Oral Oral Oral  SpO2: 100% 100% 100% 100%  Weight:      Height:       General: Appear in mild distress; no visible Abnormal Neck Mass Or lumps, Conjunctiva normal Cardiovascular: S1 and S2 Present, aortic systolic Murmur, Respiratory: good respiratory effort, Bilateral Air entry present and CTA, no Crackles, no wheezes Abdomen: Bowel Sound present, Non tender Extremities: bilateral BKA, thin portion of scar coming of on left middle finger stump Neurology: alert and oriented to Self, Place and time.  Data Reviewed: I  have Reviewed nursing notes, Vitals, and Lab results since pt's last encounter. Pertinent lab results CBC I have ordered test including CBC and BMP    Family Communication: No one at bedside  Disposition: Status is: Inpatient Remains inpatient appropriate because: Need for placement for HD  Author: Berle Mull, MD 12/03/2021 8:26 PM  Please look on [email or web address] to find out who is on call.

## 2021-12-03 NOTE — Progress Notes (Signed)
OT Cancellation Note  Patient Details Name: Courtney Gray MRN: [id number] DOB: [birth year]   Cancelled Treatment:    Reason Eval/Treat Not Completed: Patient declined, no reason specified (Patient stating she is tired and in pain following having had hydro therapy. Will attempt again tommorrow.) Lodema Hong, [place]  Office [place] 12/03/2021, 1:40 PM

## 2021-12-03 NOTE — Progress Notes (Signed)
[place] and spoke to [place]. At this time, the plan is to submit pt's referral to another nephrology group to see if they will accept pt at a local [facility] clinic as a stretcher HD pt.  Will await determination.   Melven Sartorius Renal Navigator [phone]

## 2021-12-03 NOTE — Progress Notes (Signed)
Bilateral UE vein mapping and right UE arterial duplex studies completed. Please see CV Proc for preliminary results.  Anderson Malta  Channon Ambrosini BS, RVT 12/03/2021 2:46 PM

## 2021-12-03 NOTE — Progress Notes (Signed)
Physical Therapy Wound Treatment Patient Details  Name: Courtney Gray MRN: [id number] Date of Birth: [birth year]  Today's Date: 12/03/2021 Time: 1200-1254 Time Calculation (min): 54 min  Subjective  Subjective Assessment Subjective: Pt pleasant and agreeable to hydrotherapy Patient and Family Stated Goals: get this wound healed Date of Onset:  (Unknown) Prior Treatments: unknown  Pain Score:  Premedicated and occasionally complaining of pain.   Wound Assessment  Pressure Injury 11/12/21 Hip Right Unstageable - Full thickness tissue loss in which the base of the injury is covered by slough (yellow, tan, gray, green or brown) and/or eschar (tan, brown or black) in the wound bed. 90% covered with slough (Active)  Dressing Type Foam - Lift dressing to assess site every shift;Gauze (Comment);Honey;Barrier Film (skin prep) 12/03/21 1504  Dressing Clean, Dry, Intact 12/03/21 1504  Dressing Change Frequency Twice a day 12/03/21 1504  State of Healing Early/partial granulation 12/03/21 1504  Site / Wound Assessment Red;Yellow 12/03/21 1504  % Wound base Red or Granulating 85% 12/03/21 1504  % Wound base Yellow/Fibrinous Exudate 15% 12/03/21 1504  % Wound base Black/Eschar 0% 12/03/21 1504  % Wound base Other/Granulation Tissue (Comment) 0% 12/03/21 1504  Peri-wound Assessment Intact 12/03/21 1504  Wound Length (cm) 9 cm 11/29/21 1234  Wound Width (cm) 9 cm 11/29/21 1234  Wound Depth (cm) 5.8 cm 11/29/21 1234  Wound Surface Area (cm^2) 81 cm^2 11/29/21 1234  Wound Volume (cm^3) 469.8 cm^3 11/29/21 1234  Tunneling (cm) 0 11/29/21 1234  Undermining (cm) 3.2 cm at 9:00, 1.4 cm 10:00-1:00 11/29/21 1234  Margins Unattached edges (unapproximated) 12/03/21 1504  Drainage Amount Moderate 12/03/21 1504  Drainage Description Serosanguineous 12/03/21 1504  Treatment Debridement (Selective);Irrigation;Packing (Dry gauze) 12/03/21 1504      Selective Debridement Selective Debridement - Location: R  ischium Selective Debridement - Tools Used: Forceps, Scalpel Selective Debridement - Tissue Removed: Yellow unviable tissue    Wound Assessment and Plan  Wound Therapy - Assess/Plan/Recommendations Wound Therapy - Clinical Statement: Necrotic area at the base of the wound is very deep and difficult to access, however overall appears improved. Feel we are near transition to dressing changes only vs wound VAC. Will plan to assess with WOC on 12/06/2021. This patient will benefit from continued hydrotherapy for selective removal of unviable tissue, to decrease bioburden, and promote wound bed healing. Wound Therapy - Functional Problem List: compromised mobility Factors Delaying/Impairing Wound Healing: Diabetes Mellitus, Immobility Hydrotherapy Plan: Debridement, Dressing change, Patient/family education Wound Therapy - Frequency:  (Tues/Friday) Wound Therapy - Follow Up Recommendations: dressing changes by RN  Wound Therapy Goals- Improve the function of patient's integumentary system by progressing the wound(s) through the phases of wound healing (inflammation - proliferation - remodeling) by: Wound Therapy Goals - Improve the function of patient's integumentary system by progressing the wound(s) through the phases of wound healing by: Decrease Necrotic Tissue to: 10% Decrease Necrotic Tissue - Progress: Progressing toward goal Increase Granulation Tissue to: 90% Increase Granulation Tissue - Progress: Progressing toward goal Improve Drainage Characteristics: Min, Serous Improve Drainage Characteristics - Progress: Progressing toward goal Goals/treatment plan/discharge plan were made with and agreed upon by patient/family: Yes Time For Goal Achievement: 7 days Wound Therapy - Potential for Goals: Good  Goals will be updated until maximal potential achieved or discharge criteria met.  Discharge criteria: when goals achieved, discharge from hospital, MD decision/surgical intervention, no  progress towards goals, refusal/missing three consecutive treatments without notification or medical reason.  GP     Charges PT Wound Care Charges $  Wound Debridement up to 20 cm: < or equal to 20 cm $PT Hydrotherapy Dressing: 3 dressings $PT Hydrotherapy Visit: 1 Visit       Thelma Comp 12/03/2021, 3:24 PM  Rolinda Roan, PT, DPT Acute [facility] Services Secure Chat Preferred Office: [phone]

## 2021-12-04 ENCOUNTER — Encounter (HOSPITAL_COMMUNITY)
Admission: EM | Disposition: A | Discharge status: Skilled Nursing Facility | Payer: Self-pay | Source: Ambulatory Visit | Attending: Internal Medicine

## 2021-12-04 DIAGNOSIS — I10 Essential (primary) hypertension: Secondary | ICD-10-CM

## 2021-12-04 DIAGNOSIS — N186 End stage renal disease: Secondary | ICD-10-CM | POA: Diagnosis not present

## 2021-12-04 DIAGNOSIS — I739 Peripheral vascular disease, unspecified: Secondary | ICD-10-CM | POA: Diagnosis not present

## 2021-12-04 HISTORY — PX: UPPER EXTREMITY VENOGRAPHY: CATH118272

## 2021-12-04 LAB — CBC
HCT: 26.7 % — ABNORMAL LOW (ref 36.0–46.0)
Hemoglobin: 8 g/dL — ABNORMAL LOW (ref 12.0–15.0)
MCH: 29.7 pg (ref 26.0–34.0)
MCHC: 30 g/dL (ref 30.0–36.0)
MCV: 99.3 fL (ref 80.0–100.0)
Platelets: 267 10*3/uL (ref 150–400)
RBC: 2.69 MIL/uL — ABNORMAL LOW (ref 3.87–5.11)
RDW: 15.7 % — ABNORMAL HIGH (ref 11.5–15.5)
WBC: 15.6 10*3/uL — ABNORMAL HIGH (ref 4.0–10.5)
nRBC: 0.1 % (ref 0.0–0.2)

## 2021-12-04 LAB — RENAL FUNCTION PANEL
Albumin: 2.2 g/dL — ABNORMAL LOW (ref 3.5–5.0)
Anion gap: 20 — ABNORMAL HIGH (ref 5–15)
BUN: 50 mg/dL — ABNORMAL HIGH (ref 6–20)
CO2: 23 mmol/L (ref 22–32)
Calcium: 9.3 mg/dL (ref 8.9–10.3)
Chloride: 91 mmol/L — ABNORMAL LOW (ref 98–111)
Creatinine, Ser: 8.73 mg/dL — ABNORMAL HIGH (ref 0.44–1.00)
GFR, Estimated: 5 mL/min — ABNORMAL LOW (ref >60)
Glucose, Bld: 152 mg/dL — ABNORMAL HIGH (ref 70–99)
Phosphorus: 8.8 mg/dL — ABNORMAL HIGH (ref 2.5–4.6)
Potassium: 4 mmol/L (ref 3.5–5.1)
Sodium: 134 mmol/L — ABNORMAL LOW (ref 135–145)

## 2021-12-04 LAB — GLUCOSE, CAPILLARY
Glucose-Capillary: 108 mg/dL — ABNORMAL HIGH (ref 70–99)
Glucose-Capillary: 165 mg/dL — ABNORMAL HIGH (ref 70–99)
Glucose-Capillary: 147 mg/dL — ABNORMAL HIGH (ref 70–99)

## 2021-12-04 LAB — IRON AND TIBC
Iron: 57 ug/dL (ref 28–170)
Saturation Ratios: 48 % — ABNORMAL HIGH (ref 10.4–31.8)
TIBC: 119 ug/dL — ABNORMAL LOW (ref 250–450)
UIBC: 62 ug/dL

## 2021-12-04 LAB — FERRITIN: Ferritin: 1431 ng/mL — ABNORMAL HIGH (ref 11–307)

## 2021-12-04 SURGERY — UPPER EXTREMITY VENOGRAPHY
Anesthesia: LOCAL | Laterality: Right

## 2021-12-04 MED ORDER — FENTANYL CITRATE (PF) 100 MCG/2ML IJ SOLN
INTRAMUSCULAR | Status: AC
  Filled 2021-12-04: qty 2

## 2021-12-04 MED ORDER — IODIXANOL 320 MG/ML IV SOLN
INTRAVENOUS | Status: DC | PRN
  Administered 2021-12-04: 25 mL

## 2021-12-04 MED ORDER — LIDOCAINE HCL (PF) 1 % IJ SOLN
INTRAMUSCULAR | Status: AC
  Filled 2021-12-04: qty 30

## 2021-12-04 MED ORDER — HEPARIN SODIUM (PORCINE) 1000 UNIT/ML IJ SOLN
INTRAMUSCULAR | Status: AC
  Administered 2021-12-04: 3.8 [IU]
  Filled 2021-12-04: qty 4

## 2021-12-04 MED ORDER — FENTANYL CITRATE (PF) 100 MCG/2ML IJ SOLN
INTRAMUSCULAR | Status: DC | PRN
Start: 2021-12-04 — End: 2021-12-04
  Administered 2021-12-04: 25 ug via INTRAVENOUS

## 2021-12-04 MED ORDER — HEPARIN (PORCINE) IN NACL 1000-0.9 UT/500ML-% IV SOLN
INTRAVENOUS | Status: AC
  Filled 2021-12-04: qty 500

## 2021-12-04 MED ORDER — MIDODRINE HCL 5 MG PO TABS
ORAL_TABLET | ORAL | Status: AC
  Administered 2021-12-04: 10 mg
  Filled 2021-12-04: qty 2

## 2021-12-04 MED ORDER — MIDODRINE HCL 5 MG PO TABS
10.0000 mg | ORAL_TABLET | ORAL | Status: DC
Start: 2021-12-04 — End: 2022-03-02
  Administered 2021-12-06 – 2022-02-26 (×38): 10 mg via ORAL
  Filled 2021-12-04 (×44): qty 2

## 2021-12-04 MED ORDER — MIDODRINE HCL 5 MG PO TABS: 10.0000 mg | ORAL_TABLET | ORAL | Status: DC | PRN

## 2021-12-04 SURGICAL SUPPLY — 2 items
STOPCOCK MORSE 400PSI 3WAY (MISCELLANEOUS) IMPLANT
TUBING CIL FLEX 10 FLL-RA (TUBING) IMPLANT

## 2021-12-04 NOTE — Progress Notes (Signed)
  Progress Note    12/04/2021 8:09 AM * No surgery date entered *  Subjective:  no new issues  Vitals:   12/04/21 0725 12/04/21 0748  BP: (!) 108/42 109/67  Pulse: 71 69  Resp: 19 18  Temp: 99.2 F (37.3 C)   SpO2: 99% 99%    Physical Exam: Aaox3 Left ij catheter in place  CBC    Component Value Date/Time   WBC 15.6 (H) 12/03/2021 0430   RBC 2.85 (L) 12/03/2021 0430   HGB 8.5 (L) 12/03/2021 0430   HCT 27.4 (L) 12/03/2021 0430   PLT 223 12/03/2021 0430   MCV 96.1 12/03/2021 0430   MCH 29.8 12/03/2021 0430   MCHC 31.0 12/03/2021 0430   RDW 15.8 (H) 12/03/2021 0430   LYMPHSABS 1.5 12/03/2021 0430   MONOABS 2.0 (H) 12/03/2021 0430   EOSABS 1.1 (H) 12/03/2021 0430   BASOSABS 0.1 12/03/2021 0430    BMET    Component Value Date/Time   NA 137 12/02/2021 0801   K 4.2 12/02/2021 0801   CL 98 12/02/2021 0801   CO2 22 12/02/2021 0801   GLUCOSE 188 (H) 12/02/2021 0801   BUN 66 (H) 12/02/2021 0801   CREATININE 9.89 (H) 12/02/2021 0801   CALCIUM 8.8 (L) 12/02/2021 0801   GFRNONAA 4 (L) 12/02/2021 0801   GFRAA 6 (L) 11/29/2019 0120    INR    Component Value Date/Time   INR 1.3 (H) 10/09/2021 1219     Intake/Output Summary (Last 24 hours) at 12/04/2021 0809 Last data filed at 12/03/2021 1745 Gross per 24 hour  Intake 520 ml  Output --  Net 520 ml     Assessment:  51 y.o. female is in need of permanent access, left IJ tdc working today  Plan: Right upper extremity venogram today, does not need to be npo   Wil Slape C. Donzetta Matters, MD [facility] [place] Office: [phone] Pager: [phone]  12/04/2021 8:09 AM

## 2021-12-04 NOTE — Op Note (Signed)
    Patient name: Courtney Gray  MRN: [id number] DOB: 01/08/1971 Sex: female  12/04/2021 Pre-operative Diagnosis: End stage renal disease Post-operative diagnosis:  Same Surgeon:  Broadus John, MD Procedure Performed: 1.  Right upper extremity venogram 2.  Central venogram    Indications:  Patient is a 51 year old female with end-stage renal disease requiring long-term HD access.  She has had multiple access procedures.  There is concern she may not have adequate axillary vein in the right upper extremity for AV graft placement.  After discussing the risks and benefits of right upper extremity venogram to define venous outflow, Courtney Gray elected to proceed.  Findings:  Right basilic vein widely patent. Paired brachial veins did not opacify likely due to the vein cannulated peripherally. The axillary vein was widely patent. The subclavian vein appears to have some level of stenosis from compression where the clavicle crossed the first rib.    Procedure:   Patient was brought to the Cath Lab and laid in the supine position.  A small amount of pain medication was administered due to bedsores.  Right upper extremity venogram followed from previously placed IV.   Courtney Santee, MD [facility] [place] [facility] Office: [phone]

## 2021-12-04 NOTE — Consult Note (Signed)
[place] Nurse wound follow up Wound type: Stage 3 Pressure injury Measurement: see PT notes from 12/03/21 Wound bed:95% clean, 5% yellow at base  Drainage (amount, consistency, odor) moderate, serosanguinous  Periwound: intact  Dressing procedure/placement/frequency: DC PT for hydro/CSWD   Pack right ischial wound with silver hydrofiber (Aquacel Ag+) Lawson # [phone] to wound base, fluff fill wound bed with DRY kerlix or gauze. Top with dry dressing. Change daily  Notified PT department, bedside nurse, and MD.    Re consult if needed, will not follow at this time. Thanks  Deshaun Weisinger [employer], RN,CWOCN, CNS, [place] [phone])

## 2021-12-04 NOTE — Progress Notes (Signed)
[facility] Progress Note   Subjective:   Seen on HD. Courtney Gray working with BFR 350. Pt denies SOB, CP, dizziness and nausea.   Objective Vitals:   12/04/21 0725 12/04/21 0748 12/04/21 0830 12/04/21 0842  BP: (!) 108/42 109/67 (!) 117/33 (!) 108/54  Pulse: 71 69 69   Resp: 19 18 11    Temp: 99.2 F (37.3 C)     TempSrc:      SpO2: 99% 99%    Weight: 121 kg     Height:       Physical Exam General: Alert female in NAD Heart: RRR, no murmurs, rubs or gallops Lungs: CTA bilaterally, mild tachypnea Abdomen: Obese abdomen, + BS Extremities: b/l AKA, no significant edema Dialysis Access: Left IJ tunneled PermCath accessed  Additional Objective Labs: Basic Metabolic Panel: Recent Labs  Lab 11/28/21 0421 11/30/21 0748 12/02/21 0801  NA 132* 135 137  K 4.9 4.2 4.2  CL 96* 99 98  CO2 20* 21* 22  GLUCOSE 95 107* 188*  BUN 47* 44* 66*  CREATININE 7.68* 6.79* 9.89*  CALCIUM 8.8* 9.0 8.8*  PHOS 8.0* 7.8* 9.3*   Liver Function Tests: Recent Labs  Lab 11/28/21 0421 11/30/21 0748 12/02/21 0801  ALBUMIN 2.4* 2.3* 2.2*   No results for input(s): "LIPASE", "AMYLASE" in the last 168 hours. CBC: Recent Labs  Lab 11/29/21 1810 11/30/21 0748 12/02/21 1022 12/03/21 0430 12/04/21 0738  WBC 16.4* 14.6* 14.9* 15.6* 15.6*  NEUTROABS 12.8* 10.5*  --  10.5*  --   HGB 8.8* 9.0* 7.8* 8.5* 8.0*  HCT 27.9* 28.6* 25.2* 27.4* 26.7*  MCV 95.5 95.3 97.3 96.1 99.3  PLT 219 216 245 223 267   Blood Culture    Component Value Date/Time   SDES BLOOD LEFT ANTECUBITAL 10/16/2021 1534   SPECREQUEST  10/16/2021 1534    BOTTLES DRAWN AEROBIC AND ANAEROBIC Blood Culture results may not be optimal due to an inadequate volume of blood received in culture bottles   CULT  10/16/2021 1534    NO GROWTH 5 DAYS Performed at [facility], [place] [address], [place], [place] [address]    REPTSTATUS 10/21/2021 FINAL 10/16/2021 1534    Cardiac Enzymes: No results for input(s):  "CKTOTAL", "CKMB", "CKMBINDEX", "TROPONINI" in the last 168 hours. CBG: Recent Labs  Lab 12/02/21 2158 12/03/21 0729 12/03/21 1207 12/03/21 1637 12/03/21 2237  GLUCAP 117* 82 153* 106* 92   Iron Studies: No results for input(s): "IRON", "TIBC", "TRANSFERRIN", "FERRITIN" in the last 72 hours. @lablastinr3 @ Studies/Results: VAS [place] UPPER EXTREMITY ARTERIAL DUPLEX  Result Date: 12/03/2021  UPPER EXTREMITY DUPLEX STUDY Patient Name:  Courtney Gray  Date of Exam:   12/03/2021 Medical Rec #: [id number]        Accession #:    [id number] Date of Birth: 1970-07-08         Patient Gender: F Patient Age:   51 years Exam Location:  [facility] Procedure:      VAS [place] UPPER EXTREMITY ARTERIAL DUPLEX Referring Phys: Servando Snare --------------------------------------------------------------------------------  Indications: End stage renal disease.  Limitations: Depth of vessels, body habitus Comparison Study: No previous exam noted. Performing Technologist: Bobetta Lime BS, RVT  Examination Guidelines: A complete evaluation includes B-mode imaging, spectral Doppler, color Doppler, and power Doppler as needed of all accessible portions of each vessel. Bilateral testing is considered an integral part of a complete examination. Limited examinations for reoccurring indications may be performed as noted.  Right Doppler Findings: +---------------+----------+---------+--------+--------+ Site  PSV (cm/s)Waveform StenosisComments +---------------+----------+---------+--------+--------+ Subclavian Mid 77        triphasic                 +---------------+----------+---------+--------+--------+ Subclavian Dist          triphasic                 +---------------+----------+---------+--------+--------+ Axillary       124       triphasic                 +---------------+----------+---------+--------+--------+ Brachial Prox  72        triphasic                  +---------------+----------+---------+--------+--------+ Brachial Mid   72        triphasic                 +---------------+----------+---------+--------+--------+ Brachial Dist  53        triphasic                 +---------------+----------+---------+--------+--------+ Radial Prox    14        triphasic                 +---------------+----------+---------+--------+--------+ Radial Mid     41        triphasic                 +---------------+----------+---------+--------+--------+ Radial Dist    25        triphasic                 +---------------+----------+---------+--------+--------+ Ulnar Prox     48        triphasic                 +---------------+----------+---------+--------+--------+ Ulnar Mid      66        triphasic                 +---------------+----------+---------+--------+--------+ Ulnar Dist     76        triphasic                 +---------------+----------+---------+--------+--------+    Summary:  Right: No obstruction visualized in the right upper extremity. *See table(s) above for measurements and observations. Electronically signed by Deitra Mayo MD on 12/03/2021 at 4:56:33 PM.    Final    VAS [place] UPPER EXT VEIN MAPPING (PRE-OP AVF)  Result Date: 12/03/2021 UPPER EXTREMITY VEIN MAPPING Patient Name:  Courtney Gray  Date of Exam:   12/03/2021 Medical Rec #: [id number]        Accession #:    [id number] Date of Birth: [birth year]         Patient Gender: F Patient Age:   70 years Exam Location:  [facility] Procedure:      VAS [place] UPPER EXT VEIN MAPPING (PRE-OP AVF) Referring Phys: Anice Paganini --------------------------------------------------------------------------------  Indications: Pre-access. Limitations: Body habitus Comparison Study: No previous exam noted. Performing Technologist: Bobetta Lime BS, RVT  Examination Guidelines: A complete evaluation includes B-mode imaging, spectral Doppler, color Doppler, and power  Doppler as needed of all accessible portions of each vessel. Bilateral testing is considered an integral part of a complete examination. Limited examinations for reoccurring indications may be performed as noted. +-----------------+-------------+----------+-----------------------------------+ Right Cephalic   Diameter (cm)Depth (cm)             Findings               +-----------------+-------------+----------+-----------------------------------+  Shoulder             0.31        2.07                                       +-----------------+-------------+----------+-----------------------------------+ Prox upper arm       0.29        1.07                                       +-----------------+-------------+----------+-----------------------------------+ Mid upper arm                             Non-compressible/former outflow                                                   vein for failed AVF         +-----------------+-------------+----------+-----------------------------------+ Dist upper arm                                   Non-compressible           +-----------------+-------------+----------+-----------------------------------+ Antecubital fossa                                Non-compressible           +-----------------+-------------+----------+-----------------------------------+ Prox forearm         0.14        1.10                                       +-----------------+-------------+----------+-----------------------------------+ Mid forearm          0.17        1.28                                       +-----------------+-------------+----------+-----------------------------------+ Dist forearm                                      not visualized            +-----------------+-------------+----------+-----------------------------------+ Wrist                                             not visualized             +-----------------+-------------+----------+-----------------------------------+ +-----------------+-------------+----------+--------------+ Right Basilic    Diameter (cm)Depth (cm)   Findings    +-----------------+-------------+----------+--------------+ Dist upper arm       0.47        2.42                  +-----------------+-------------+----------+--------------+ Antecubital fossa    0.17  2.92                  +-----------------+-------------+----------+--------------+ Prox forearm                            not visualized +-----------------+-------------+----------+--------------+ Mid forearm                             not visualized +-----------------+-------------+----------+--------------+ Distal forearm                          not visualized +-----------------+-------------+----------+--------------+ +-----------------+-------------+----------+----------------------------+ Left Cephalic    Diameter (cm)Depth (cm)          Findings           +-----------------+-------------+----------+----------------------------+ Shoulder             0.27        2.48                                +-----------------+-------------+----------+----------------------------+ Prox upper arm       0.21        1.98                                +-----------------+-------------+----------+----------------------------+ Mid upper arm        0.28        1.58                                +-----------------+-------------+----------+----------------------------+ Dist upper arm       0.22        1.63                                +-----------------+-------------+----------+----------------------------+ Antecubital fossa    0.39        1.33                                +-----------------+-------------+----------+----------------------------+ Prox forearm                            branching and not visualized  +-----------------+-------------+----------+----------------------------+ Mid forearm                                    not visualized        +-----------------+-------------+----------+----------------------------+ Dist forearm                                   not visualized        +-----------------+-------------+----------+----------------------------+ +-----------------+-------------+----------+--------------+ Left Basilic     Diameter (cm)Depth (cm)   Findings    +-----------------+-------------+----------+--------------+ Dist upper arm       0.49        2.28                  +-----------------+-------------+----------+--------------+ Antecubital fossa    0.35        2.49                  +-----------------+-------------+----------+--------------+  Prox forearm         0.30        3.59                  +-----------------+-------------+----------+--------------+ Mid forearm                             not visualized +-----------------+-------------+----------+--------------+ Distal forearm                          not visualized +-----------------+-------------+----------+--------------+ *See table(s) above for measurements and observations.  Diagnosing physician: Deitra Mayo MD Electronically signed by Deitra Mayo MD on 12/03/2021 at 4:56:21 PM.    Final    Medications:  ferric gluconate (FERRLECIT) IVPB Stopped (12/02/21 1318)    aspirin EC  81 mg Oral Daily   atorvastatin  10 mg Oral Daily   Chlorhexidine Gluconate Cloth  6 each Topical Q0600   [START ON 12/09/2021] darbepoetin (ARANESP) injection - DIALYSIS  150 mcg Intravenous Q Mon-HD   feeding supplement  1 Container Oral TID BM   gabapentin  100 mg Oral Q1200   gabapentin  200 mg Oral QHS   heparin  5,000 Units Subcutaneous Q8H   insulin aspart  0-5 Units Subcutaneous QHS   insulin aspart  0-6 Units Subcutaneous TID WC   leptospermum manuka honey  1 Application Topical Daily    levothyroxine  100 mcg Oral Q0600   [START ON 12/05/2021] lubiprostone  8 mcg Oral Once per day on Sun Tue Thu Sat   metoprolol tartrate  12.5 mg Oral BID   pantoprazole  40 mg Oral Daily   sevelamer carbonate  1.6 g Oral TID WC   simethicone  80 mg Oral TID AC & HS    Outpatient Dialysis Orders: MWF - [place]  4hrs, BFR 400, DFR 600,  AVF, EDW 146kg, 3K/ 2Ca - Heparin 2500 units with HD - Hectorol 50mcg IV qHD, held as of 10/30/21  Assessment/Plan: ESRD: HD MWF with abbreviated treatment 8/22 due to PermCath malfunction exchanged 8/22, unfortunately not working again 8/24 IR exchanged PermCath 8/24, problems again 8/25 required Cathflo. [place] did work well last HD and is working today 2. Dialysis PermCath malfunction: see above. IR thinks TDC is positional, did not have fibrin at last exchange. Discussed the need for a long term solution and patient does agree to a vascular surgery consult. VVS onboard 3. Hypertension/volume: noted on metoprolol 12.5 mg twice daily, does not appear to have significant excess volume. On O2 chronically. Wide variation in bed weights 4. Chronic respiratory failure with hypoxia on cannula oxygen at home: on 5L O2 at baseline 5. Anemia: Hgb 8.0. Aranesp dose increased. Continue weekly iron and will recheck iron levels.  6. Secondary hyperparathyroidism: corrected calcium has been high, vitamin D on hold. Phos elevated, on renvela, increasing dose 7. PAD: status post bilateral AKA for gangrene follow-up Dr. Stanford Breed, status post left middle finger amputation for gangrene 8. Disposition: Difficult situation recently: Patient required 4 staff members and 2 hoyer pads for transfer into HD chair as OP. Could not stay on dialysis for full treatment because of pain of multiple decubitus ulcers, SW working on Marine scientist HD     Anice Paganini, PA-C 12/04/2021, 9:43 AM  [employer] Pager: [phone]

## 2021-12-04 NOTE — Progress Notes (Signed)
PROGRESS NOTE    Courtney Gray  QJF:[id number] DOB: Oct 08, 1970 DOA: 11/01/2021 PCP: Monico Blitz, MD   Brief Narrative:   Assessment and Plan:  Acute on chronic diastolic heart failure Present on admission with associated pulmonary edema. Treated with hemodialysis. Acute process resolved. -Continue fluid management with hemodialysis.  Chronic respiratory failure Stable.  Acute pulmonary edema Secondary to acute heart failure and managed with hemodialysis.  Hypertensive emergency BP of 181/81 mmHg with associated acute heart failure on admission. Started on Imdur and managed with hemodialysis. Resolved.  ESRD on HD Nephrology consulted and are managing hemodialysis. Vascular surgery on board for consideration of AV fistula. Venogram ordered today.  Anemia of chronic kidney disease Hemoglobin stable.  Diabetes mellitus type 2 Well controlled on insulin. Hemoglobin A1C of 5.7%. 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 -Continue aspirin  Hypomagnesemia Supplementation given.  Leukocytosis Mild and stable. No obvious evidence of acute infection. Loculated left pleural effusion noted on CT scan on 8/9. Patient is afebrile. -Will consider possible repeat chest CT scan to ensure improvement of loculated pleural effusion  Irritable bowel syndrome with constipation -Continue Amitiza  History of septic shock secondary to bacteroides bacteremia with liver and splenic abscesses Diagnosed on prior hospitalization. Patient completed antibiotic course this admission. Abdominal drain removed by IR on 8/9.  Morbid obesity Body mass index is 44.39 kg/m.  Pressure injury Right buttocks/right hip present on admission. Left/lower flank, not present on admission.   DVT prophylaxis: heparin subq Code Status:   Code Status: Full Code Family Communication: None at  bedside Disposition Plan: Discharge to SNF pending ability to receive outpatient HD   Consultants:  Nephrology Interventional radiology Infectious disease Vascular surgery  Procedures:  Exchange of left IJ tunneled HD cath (8/22) Right upper extremity venogram/Central venogram (8/30)  Antimicrobials: Vancomycin Cefepime Flagyl    Subjective: Patient reports no issues overnight.  Objective: BP (!) 127/57 (BP Location: Left Arm)   Pulse 70   Temp 98.8 F (37.1 C) (Oral)   Resp 18   Ht 5\' 5"  (1.651 m)   Wt 121 kg   SpO2 96%   BMI 44.39 kg/m   Examination:  General exam: Appears calm and comfortable Respiratory system: Clear to auscultation. Respiratory effort normal. Cardiovascular system: S1 & S2 heard, RRR. Gastrointestinal system: Obese abdomen, soft and nontender. No organomegaly or masses felt. Normal bowel sounds heard. Central nervous system: Alert and oriented. No focal neurological deficits. Musculoskeletal: Bilateral AKA. Left middle finger with hyperpigmentation and crusting over incision wound. Skin: No cyanosis. No rashes Psychiatry: Judgement and insight appear normal. Mood & affect appropriate.    Data Reviewed: I have personally reviewed following labs and imaging studies  CBC Lab Results  Component Value Date   WBC 15.6 (H) 12/04/2021   RBC 2.69 (L) 12/04/2021   HGB 8.0 (L) 12/04/2021   HCT 26.7 (L) 12/04/2021   MCV 99.3 12/04/2021   MCH 29.7 12/04/2021   PLT 267 12/04/2021   MCHC 30.0 12/04/2021   RDW 15.7 (H) 12/04/2021   LYMPHSABS 1.5 12/03/2021   MONOABS 2.0 (H) 12/03/2021   EOSABS 1.1 (H) 12/03/2021   BASOSABS 0.1 89/37/3428     Last metabolic panel Lab Results  Component Value Date   NA 134 (L) 12/04/2021   K 4.0 12/04/2021   CL 91 (L) 12/04/2021   CO2 23 12/04/2021   BUN 50 (H) 12/04/2021  CREATININE 8.73 (H) 12/04/2021   GLUCOSE 152 (H) 12/04/2021   GFRNONAA 5 (L) 12/04/2021   GFRAA 6 (L) 11/29/2019   CALCIUM 9.3  12/04/2021   PHOS 8.8 (H) 12/04/2021   PROT 6.9 11/06/2021   ALBUMIN 2.2 (L) 12/04/2021   BILITOT 0.9 11/06/2021   ALKPHOS 62 11/06/2021   AST 28 11/06/2021   ALT 7 11/06/2021   ANIONGAP 20 (H) 12/04/2021    GFR: Estimated Creatinine Clearance: 9.9 mL/min (A) (by C-G formula based on SCr of 8.73 mg/dL (H)).  No results found for this or any previous visit (from the past 240 hour(s)).    Radiology Studies: VAS [place] UPPER EXTREMITY ARTERIAL DUPLEX  Result Date: 12/03/2021  UPPER EXTREMITY DUPLEX STUDY Patient Name:  Courtney Gray  Date of Exam:   12/03/2021 Medical Rec #: [id number]        Accession #:    [id number] Date of Birth: 31-Oct-1970         Patient Gender: F Patient Age:   51 years Exam Location:  [facility] Procedure:      VAS [place] UPPER EXTREMITY ARTERIAL DUPLEX Referring Phys: Servando Snare --------------------------------------------------------------------------------  Indications: End stage renal disease.  Limitations: Depth of vessels, body habitus Comparison Study: No previous exam noted. Performing Technologist: Bobetta Lime BS, RVT  Examination Guidelines: A complete evaluation includes B-mode imaging, spectral Doppler, color Doppler, and power Doppler as needed of all accessible portions of each vessel. Bilateral testing is considered an integral part of a complete examination. Limited examinations for reoccurring indications may be performed as noted.  Right Doppler Findings: +---------------+----------+---------+--------+--------+ Site           PSV (cm/s)Waveform StenosisComments +---------------+----------+---------+--------+--------+ Subclavian Mid 77        triphasic                 +---------------+----------+---------+--------+--------+ Subclavian Dist          triphasic                 +---------------+----------+---------+--------+--------+ Axillary       124       triphasic                  +---------------+----------+---------+--------+--------+ Brachial Prox  72        triphasic                 +---------------+----------+---------+--------+--------+ Brachial Mid   72        triphasic                 +---------------+----------+---------+--------+--------+ Brachial Dist  53        triphasic                 +---------------+----------+---------+--------+--------+ Radial Prox    14        triphasic                 +---------------+----------+---------+--------+--------+ Radial Mid     41        triphasic                 +---------------+----------+---------+--------+--------+ Radial Dist    25        triphasic                 +---------------+----------+---------+--------+--------+ Ulnar Prox     48        triphasic                 +---------------+----------+---------+--------+--------+ Ulnar Mid  66        triphasic                 +---------------+----------+---------+--------+--------+ Ulnar Dist     76        triphasic                 +---------------+----------+---------+--------+--------+    Summary:  Right: No obstruction visualized in the right upper extremity. *See table(s) above for measurements and observations. Electronically signed by Deitra Mayo MD on 12/03/2021 at 4:56:33 PM.    Final    VAS [place] UPPER EXT VEIN MAPPING (PRE-OP AVF)  Result Date: 12/03/2021 UPPER EXTREMITY VEIN MAPPING Patient Name:  Courtney Gray  Date of Exam:   12/03/2021 Medical Rec #: [id number]        Accession #:    [id number] Date of Birth: 07-21-1970         Patient Gender: F Patient Age:   47 years Exam Location:  [facility] Procedure:      VAS [place] UPPER EXT VEIN MAPPING (PRE-OP AVF) Referring Phys: Anice Paganini --------------------------------------------------------------------------------  Indications: Pre-access. Limitations: Body habitus Comparison Study: No previous exam noted. Performing Technologist: Bobetta Lime BS, RVT   Examination Guidelines: A complete evaluation includes B-mode imaging, spectral Doppler, color Doppler, and power Doppler as needed of all accessible portions of each vessel. Bilateral testing is considered an integral part of a complete examination. Limited examinations for reoccurring indications may be performed as noted. +-----------------+-------------+----------+-----------------------------------+ Right Cephalic   Diameter (cm)Depth (cm)             Findings               +-----------------+-------------+----------+-----------------------------------+ Shoulder             0.31        2.07                                       +-----------------+-------------+----------+-----------------------------------+ Prox upper arm       0.29        1.07                                       +-----------------+-------------+----------+-----------------------------------+ Mid upper arm                             Non-compressible/former outflow                                                   vein for failed AVF         +-----------------+-------------+----------+-----------------------------------+ Dist upper arm                                   Non-compressible           +-----------------+-------------+----------+-----------------------------------+ Antecubital fossa                                Non-compressible           +-----------------+-------------+----------+-----------------------------------+ Prox forearm  0.14        1.10                                       +-----------------+-------------+----------+-----------------------------------+ Mid forearm          0.17        1.28                                       +-----------------+-------------+----------+-----------------------------------+ Dist forearm                                      not visualized            +-----------------+-------------+----------+-----------------------------------+  Wrist                                             not visualized            +-----------------+-------------+----------+-----------------------------------+ +-----------------+-------------+----------+--------------+ Right Basilic    Diameter (cm)Depth (cm)   Findings    +-----------------+-------------+----------+--------------+ Dist upper arm       0.47        2.42                  +-----------------+-------------+----------+--------------+ Antecubital fossa    0.17        2.92                  +-----------------+-------------+----------+--------------+ Prox forearm                            not visualized +-----------------+-------------+----------+--------------+ Mid forearm                             not visualized +-----------------+-------------+----------+--------------+ Distal forearm                          not visualized +-----------------+-------------+----------+--------------+ +-----------------+-------------+----------+----------------------------+ Left Cephalic    Diameter (cm)Depth (cm)          Findings           +-----------------+-------------+----------+----------------------------+ Shoulder             0.27        2.48                                +-----------------+-------------+----------+----------------------------+ Prox upper arm       0.21        1.98                                +-----------------+-------------+----------+----------------------------+ Mid upper arm        0.28        1.58                                +-----------------+-------------+----------+----------------------------+ Dist upper arm       0.22  1.63                                +-----------------+-------------+----------+----------------------------+ Antecubital fossa    0.39        1.33                                +-----------------+-------------+----------+----------------------------+ Prox forearm                             branching and not visualized +-----------------+-------------+----------+----------------------------+ Mid forearm                                    not visualized        +-----------------+-------------+----------+----------------------------+ Dist forearm                                   not visualized        +-----------------+-------------+----------+----------------------------+ +-----------------+-------------+----------+--------------+ Left Basilic     Diameter (cm)Depth (cm)   Findings    +-----------------+-------------+----------+--------------+ Dist upper arm       0.49        2.28                  +-----------------+-------------+----------+--------------+ Antecubital fossa    0.35        2.49                  +-----------------+-------------+----------+--------------+ Prox forearm         0.30        3.59                  +-----------------+-------------+----------+--------------+ Mid forearm                             not visualized +-----------------+-------------+----------+--------------+ Distal forearm                          not visualized +-----------------+-------------+----------+--------------+ *See table(s) above for measurements and observations.  Diagnosing physician: Deitra Mayo MD Electronically signed by Deitra Mayo MD on 12/03/2021 at 4:56:21 PM.    Final       LOS: 38 days    Cordelia Poche, MD [facility] 12/04/2021, 6:08 PM   If 7PM-7AM, please contact night-coverage [email or web address]

## 2021-12-04 NOTE — Progress Notes (Signed)
OT Cancellation Note  Patient Details Name: Shaelynn Dragos MRN: [id number] DOB: 14-Sep-1970   Cancelled Treatment:    Reason Eval/Treat Not Completed: Patient at procedure or test/ unavailable (Patient off unit in HD. Will attempt later today as schedule permits.) Lodema Hong, [place]  Office [phone]  Trixie Dredge 12/04/2021, 7:28 AM

## 2021-12-04 NOTE — Progress Notes (Signed)
[facility]  Subjective  - No new complaints   Objective (!) 125/44 71 98 F (36.7 C) (Oral) 16 98%  Intake/Output Summary (Last 24 hours) at 12/04/2021 0654 Last data filed at 12/03/2021 1745 Gross per 24 hour  Intake 520 ml  Output --  Net 520 ml     Left UE warm well without palpable radial pulse Left finger tip amp slow to heal Right UE palpable pulse  Right Doppler Findings:  +---------------+----------+---------+--------+--------+  Site           PSV (cm/s)Waveform StenosisComments  +---------------+----------+---------+--------+--------+  Subclavian Mid 77        triphasic                  +---------------+----------+---------+--------+--------+  Subclavian Dist          triphasic                  +---------------+----------+---------+--------+--------+  Axillary       124       triphasic                  +---------------+----------+---------+--------+--------+  Brachial Prox  72        triphasic                  +---------------+----------+---------+--------+--------+  Brachial Mid   72        triphasic                  +---------------+----------+---------+--------+--------+  Brachial Dist  53        triphasic                  +---------------+----------+---------+--------+--------+  Radial Prox    14        triphasic                  +---------------+----------+---------+--------+--------+  Radial Mid     41        triphasic                  +---------------+----------+---------+--------+--------+  Radial Dist    25        triphasic                  +---------------+----------+---------+--------+--------+  Ulnar Prox     48        triphasic                  +---------------+----------+---------+--------+--------+  Ulnar Mid      66        triphasic                  +---------------+----------+---------+--------+--------+  Ulnar Dist     76         triphasic                  +---------------+----------+---------+--------+--------+            +-----------------+-------------+----------+-------------------------------  ----+  Right Cephalic   Diameter (cm)Depth (cm)             Findings                  +-----------------+-------------+----------+-------------------------------  ----+  Shoulder             0.31        2.07                                         +-----------------+-------------+----------+-------------------------------  ----+  Prox upper arm       0.29        1.07                                         +-----------------+-------------+----------+-------------------------------  ----+  Mid upper arm                             Non-compressible/former  outflow                                                    vein for failed AVF            +-----------------+-------------+----------+-------------------------------  ----+  Dist upper arm                                   Non-compressible              +-----------------+-------------+----------+-------------------------------  ----+  Antecubital fossa                                Non-compressible              +-----------------+-------------+----------+-------------------------------  ----+  Prox forearm         0.14        1.10                                         +-----------------+-------------+----------+-------------------------------  ----+  Mid forearm          0.17        1.28                                         +-----------------+-------------+----------+-------------------------------  ----+  Dist forearm                                      not visualized              +-----------------+-------------+----------+-------------------------------  ----+  Wrist                                             not visualized               +-----------------+-------------+----------+-------------------------------  ----+   +-----------------+-------------+----------+--------------+  Right Basilic    Diameter (cm)Depth (cm)   Findings     +-----------------+-------------+----------+--------------+  Dist upper arm       0.47        2.42                   +-----------------+-------------+----------+--------------+  Antecubital fossa    0.17        2.92                   +-----------------+-------------+----------+--------------+  Prox forearm                            not visualized  +-----------------+-------------+----------+--------------+  Mid forearm                             not visualized  +-----------------+-------------+----------+--------------+  Distal forearm                          not visualized  +-----------------+-------------+----------+--------------+   +-----------------+-------------+----------+----------------------------+  Left Cephalic    Diameter (cm)Depth (cm)          Findings            +-----------------+-------------+----------+----------------------------+  Shoulder             0.27        2.48                                 +-----------------+-------------+----------+----------------------------+  Prox upper arm       0.21        1.98                                 +-----------------+-------------+----------+----------------------------+  Mid upper arm        0.28        1.58                                 +-----------------+-------------+----------+----------------------------+  Dist upper arm       0.22        1.63                                 +-----------------+-------------+----------+----------------------------+  Antecubital fossa    0.39        1.33                                 +-----------------+-------------+----------+----------------------------+  Prox forearm                            branching and not  visualized  +-----------------+-------------+----------+----------------------------+  Mid forearm                                    not visualized         +-----------------+-------------+----------+----------------------------+  Dist forearm                                   not visualized         +-----------------+-------------+----------+----------------------------+   +-----------------+-------------+----------+--------------+  Left Basilic     Diameter (cm)Depth (cm)   Findings     +-----------------+-------------+----------+--------------+  Dist upper arm       0.49        2.28                   +-----------------+-------------+----------+--------------+  Antecubital fossa    0.35  2.49                   +-----------------+-------------+----------+--------------+  Prox forearm         0.30        3.59                   +-----------------+-------------+----------+--------------+  Mid forearm                             not visualized  +-----------------+-------------+----------+--------------+  Distal forearm                          not visualized  +-----------------+-------------+----------+--------------+     Assessment/Planning: Left basilic appears to be a good option for permanent access There is no right Cephalic from previous access and the right is small at the [facility]. The arterial duplex shows right UR only with triphasic wave forms throughout.  Another option would be right UE AV graft., however with the recent bacteriemia this may not be a good access option.  DR. Donzetta Matters will discuss options and make a surgical plan.   She is in agreement to proceed with surgery pending UE venogram today of the right UE.  Roxy Horseman 12/04/2021 6:54 AM --  Laboratory Lab Results: Recent Labs    12/02/21 1022 12/03/21 0430  WBC 14.9* 15.6*  HGB 7.8* 8.5*  HCT 25.2* 27.4*  PLT 245 223   BMET Recent Labs     12/02/21 0801  NA 137  K 4.2  CL 98  CO2 22  GLUCOSE 188*  BUN 66*  CREATININE 9.89*  CALCIUM 8.8*    COAG Lab Results  Component Value Date   INR 1.3 (H) 10/09/2021   INR 1.3 (H) 09/10/2021   INR 1.3 (H) 07/10/2021   No results found for: "PTT"

## 2021-12-04 NOTE — Progress Notes (Signed)
Received a call from [place] with [facility] to say that pt has been denied by nephrologist for stretcher HD at [facility] clinic. There are no other clinics that provide stretcher HD in our area for pt to be considered for. Contacted [facility] and spoke to clinic manager to inquire if pt arrives to HD clinic in w/c instead of stretcher would pt be more appropriate for transfer from w/c to HD chair. Clinic manager to discuss pt's case further with medical director.Update provided to CSW and nephrology staff.   Melven Sartorius Renal Navigator [phone]

## 2021-12-05 DIAGNOSIS — I739 Peripheral vascular disease, unspecified: Secondary | ICD-10-CM | POA: Diagnosis not present

## 2021-12-05 DIAGNOSIS — I10 Essential (primary) hypertension: Secondary | ICD-10-CM | POA: Diagnosis not present

## 2021-12-05 DIAGNOSIS — N186 End stage renal disease: Secondary | ICD-10-CM | POA: Diagnosis not present

## 2021-12-05 LAB — GLUCOSE, CAPILLARY
Glucose-Capillary: 184 mg/dL — ABNORMAL HIGH (ref 70–99)
Glucose-Capillary: 89 mg/dL (ref 70–99)
Glucose-Capillary: 125 mg/dL — ABNORMAL HIGH (ref 70–99)
Glucose-Capillary: 140 mg/dL — ABNORMAL HIGH (ref 70–99)

## 2021-12-05 NOTE — Progress Notes (Signed)
PROGRESS NOTE    Courtney Gray  ZJI:[id number] DOB: Jun 10, 1970 DOA: 11/01/2021 PCP: Monico Blitz, MD   Brief Narrative: Courtney Gray is a 51 y.o. female with a history of ESRD on HD, chronic heart failure, PAD, bilateral AKA, diabetes mellitus, morbid obesity, pressure injury. Patient presented secondary to shortness of breath which was consistent with acute heart failure that responded to hemodialysis. Discharge is pending ability to obtain outpatient hemodialysis that can accommodate patient's mobility constraints.  Assessment and Plan:  Acute on chronic diastolic heart failure Present on admission with associated pulmonary edema. Treated with hemodialysis. Acute process resolved. -Continue fluid management with hemodialysis.  Chronic respiratory failure Stable.  Acute pulmonary edema Secondary to acute heart failure and managed with hemodialysis.  Hypertensive emergency BP of 181/81 mmHg with associated acute heart failure on admission. Started on Imdur and managed with hemodialysis. Resolved.  ESRD on HD Nephrology consulted and are managing hemodialysis. Vascular surgery on board for consideration of AV fistula vs graft. Plan for right upper arm.  Anemia of chronic kidney disease Hemoglobin stable.  Diabetes mellitus type 2 Well controlled on insulin. Hemoglobin A1C of 5.7%. 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 -Continue aspirin  Hypomagnesemia Supplementation given.  Leukocytosis Mild and stable. No obvious evidence of acute infection. Loculated left pleural effusion noted on CT scan on 8/9. Patient is afebrile. -Will consider possible repeat chest CT scan to ensure improvement of loculated pleural effusion if leukocytosis remains and no other source is identified  Irritable bowel syndrome with constipation -Continue Amitiza  History of septic shock  secondary to bacteroides bacteremia with liver and splenic abscesses Diagnosed on prior hospitalization. Patient completed antibiotic course this admission. Abdominal drain removed by IR on 8/9.  Morbid obesity Body mass index is 44.39 kg/m.  Pressure injury Right buttocks/right hip present on admission. Left/lower flank, not present on admission.   DVT prophylaxis: heparin subq Code Status:   Code Status: Full Code Family Communication: None at bedside Disposition Plan: Discharge to SNF pending ability to receive outpatient HD   Consultants:  Nephrology Interventional radiology Infectious disease Vascular surgery  Procedures:  Exchange of left IJ tunneled HD cath (8/22) Right upper extremity venogram/Central venogram (8/30)  Antimicrobials: Vancomycin Cefepime Flagyl    Subjective: No issues overnight. Considering her options for surgery. Waiting for daughter to visit and discuss.  Objective: BP (!) 125/47 (BP Location: Left Arm)   Pulse 66   Temp 98.7 F (37.1 C) (Oral)   Resp 16   Ht 5\' 5"  (1.651 m)   Wt 121 kg   SpO2 95%   BMI 44.39 kg/m   Examination:  General exam: Appears calm and comfortable Respiratory system: Clear to auscultation. Respiratory effort normal. Cardiovascular system: S1 & S2 heard, RRR. Gastrointestinal system: Abdomen is nondistended, soft and nontender. Normal bowel sounds heard. Central nervous system: Alert and oriented. No focal neurological deficits. Musculoskeletal: Bilateral AKA. Skin: No cyanosis. No rashes Psychiatry: Judgement and insight appear normal. Mood & affect appropriate.    Data Reviewed: I have personally reviewed following labs and imaging studies  CBC Lab Results  Component Value Date   WBC 15.6 (H) 12/04/2021   RBC 2.69 (L) 12/04/2021   HGB 8.0 (L) 12/04/2021   HCT 26.7 (L) 12/04/2021   MCV 99.3 12/04/2021   MCH 29.7 12/04/2021   PLT 267 12/04/2021   MCHC 30.0 12/04/2021   RDW 15.7 (H)  12/04/2021    LYMPHSABS 1.5 12/03/2021   MONOABS 2.0 (H) 12/03/2021   EOSABS 1.1 (H) 12/03/2021   BASOSABS 0.1 93/81/8299     Last metabolic panel Lab Results  Component Value Date   NA 134 (L) 12/04/2021   K 4.0 12/04/2021   CL 91 (L) 12/04/2021   CO2 23 12/04/2021   BUN 50 (H) 12/04/2021   CREATININE 8.73 (H) 12/04/2021   GLUCOSE 152 (H) 12/04/2021   GFRNONAA 5 (L) 12/04/2021   GFRAA 6 (L) 11/29/2019   CALCIUM 9.3 12/04/2021   PHOS 8.8 (H) 12/04/2021   PROT 6.9 11/06/2021   ALBUMIN 2.2 (L) 12/04/2021   BILITOT 0.9 11/06/2021   ALKPHOS 62 11/06/2021   AST 28 11/06/2021   ALT 7 11/06/2021   ANIONGAP 20 (H) 12/04/2021    GFR: Estimated Creatinine Clearance: 9.9 mL/min (A) (by C-G formula based on SCr of 8.73 mg/dL (H)).  No results found for this or any previous visit (from the past 240 hour(s)).    Radiology Studies: VAS [place] UPPER EXTREMITY ARTERIAL DUPLEX  Result Date: 12/03/2021  UPPER EXTREMITY DUPLEX STUDY Patient Name:  Courtney Gray  Date of Exam:   12/03/2021 Medical Rec #: [id number]        Accession #:    [id number] Date of Birth: [birth year]         Patient Gender: F Patient Age:   68 years Exam Location:  [facility] Procedure:      VAS [place] UPPER EXTREMITY ARTERIAL DUPLEX Referring Phys: Servando Snare --------------------------------------------------------------------------------  Indications: End stage renal disease.  Limitations: Depth of vessels, body habitus Comparison Study: No previous exam noted. Performing Technologist: Bobetta Lime BS, RVT  Examination Guidelines: A complete evaluation includes B-mode imaging, spectral Doppler, color Doppler, and power Doppler as needed of all accessible portions of each vessel. Bilateral testing is considered an integral part of a complete examination. Limited examinations for reoccurring indications may be performed as noted.  Right Doppler Findings: +---------------+----------+---------+--------+--------+ Site           PSV  (cm/s)Waveform StenosisComments +---------------+----------+---------+--------+--------+ Subclavian Mid 77        triphasic                 +---------------+----------+---------+--------+--------+ Subclavian Dist          triphasic                 +---------------+----------+---------+--------+--------+ Axillary       124       triphasic                 +---------------+----------+---------+--------+--------+ Brachial Prox  72        triphasic                 +---------------+----------+---------+--------+--------+ Brachial Mid   72        triphasic                 +---------------+----------+---------+--------+--------+ Brachial Dist  53        triphasic                 +---------------+----------+---------+--------+--------+ Radial Prox    14        triphasic                 +---------------+----------+---------+--------+--------+ Radial Mid     41        triphasic                 +---------------+----------+---------+--------+--------+ Radial Dist  25        triphasic                 +---------------+----------+---------+--------+--------+ Ulnar Prox     48        triphasic                 +---------------+----------+---------+--------+--------+ Ulnar Mid      66        triphasic                 +---------------+----------+---------+--------+--------+ Ulnar Dist     76        triphasic                 +---------------+----------+---------+--------+--------+    Summary:  Right: No obstruction visualized in the right upper extremity. *See table(s) above for measurements and observations. Electronically signed by Deitra Mayo MD on 12/03/2021 at 4:56:33 PM.    Final    VAS [place] UPPER EXT VEIN MAPPING (PRE-OP AVF)  Result Date: 12/03/2021 UPPER EXTREMITY VEIN MAPPING Patient Name:  KLA BILY  Date of Exam:   12/03/2021 Medical Rec #: [id number]        Accession #:    [id number] Date of Birth: 1971/04/07         Patient Gender: F  Patient Age:   35 years Exam Location:  [facility] Procedure:      VAS [place] UPPER EXT VEIN MAPPING (PRE-OP AVF) Referring Phys: Anice Paganini --------------------------------------------------------------------------------  Indications: Pre-access. Limitations: Body habitus Comparison Study: No previous exam noted. Performing Technologist: Bobetta Lime BS, RVT  Examination Guidelines: A complete evaluation includes B-mode imaging, spectral Doppler, color Doppler, and power Doppler as needed of all accessible portions of each vessel. Bilateral testing is considered an integral part of a complete examination. Limited examinations for reoccurring indications may be performed as noted. +-----------------+-------------+----------+-----------------------------------+ Right Cephalic   Diameter (cm)Depth (cm)             Findings               +-----------------+-------------+----------+-----------------------------------+ Shoulder             0.31        2.07                                       +-----------------+-------------+----------+-----------------------------------+ Prox upper arm       0.29        1.07                                       +-----------------+-------------+----------+-----------------------------------+ Mid upper arm                             Non-compressible/former outflow                                                   vein for failed AVF         +-----------------+-------------+----------+-----------------------------------+ Dist upper arm  Non-compressible           +-----------------+-------------+----------+-----------------------------------+ Antecubital fossa                                Non-compressible           +-----------------+-------------+----------+-----------------------------------+ Prox forearm         0.14        1.10                                        +-----------------+-------------+----------+-----------------------------------+ Mid forearm          0.17        1.28                                       +-----------------+-------------+----------+-----------------------------------+ Dist forearm                                      not visualized            +-----------------+-------------+----------+-----------------------------------+ Wrist                                             not visualized            +-----------------+-------------+----------+-----------------------------------+ +-----------------+-------------+----------+--------------+ Right Basilic    Diameter (cm)Depth (cm)   Findings    +-----------------+-------------+----------+--------------+ Dist upper arm       0.47        2.42                  +-----------------+-------------+----------+--------------+ Antecubital fossa    0.17        2.92                  +-----------------+-------------+----------+--------------+ Prox forearm                            not visualized +-----------------+-------------+----------+--------------+ Mid forearm                             not visualized +-----------------+-------------+----------+--------------+ Distal forearm                          not visualized +-----------------+-------------+----------+--------------+ +-----------------+-------------+----------+----------------------------+ Left Cephalic    Diameter (cm)Depth (cm)          Findings           +-----------------+-------------+----------+----------------------------+ Shoulder             0.27        2.48                                +-----------------+-------------+----------+----------------------------+ Prox upper arm       0.21        1.98                                +-----------------+-------------+----------+----------------------------+  Mid upper arm        0.28        1.58                                 +-----------------+-------------+----------+----------------------------+ Dist upper arm       0.22        1.63                                +-----------------+-------------+----------+----------------------------+ Antecubital fossa    0.39        1.33                                +-----------------+-------------+----------+----------------------------+ Prox forearm                            branching and not visualized +-----------------+-------------+----------+----------------------------+ Mid forearm                                    not visualized        +-----------------+-------------+----------+----------------------------+ Dist forearm                                   not visualized        +-----------------+-------------+----------+----------------------------+ +-----------------+-------------+----------+--------------+ Left Basilic     Diameter (cm)Depth (cm)   Findings    +-----------------+-------------+----------+--------------+ Dist upper arm       0.49        2.28                  +-----------------+-------------+----------+--------------+ Antecubital fossa    0.35        2.49                  +-----------------+-------------+----------+--------------+ Prox forearm         0.30        3.59                  +-----------------+-------------+----------+--------------+ Mid forearm                             not visualized +-----------------+-------------+----------+--------------+ Distal forearm                          not visualized +-----------------+-------------+----------+--------------+ *See table(s) above for measurements and observations.  Diagnosing physician: Deitra Mayo MD Electronically signed by Deitra Mayo MD on 12/03/2021 at 4:56:21 PM.    Final       LOS: 70 days    Cordelia Poche, MD [facility] 12/05/2021, 12:22 PM   If 7PM-7AM, please contact night-coverage [email or web address]

## 2021-12-05 NOTE — Progress Notes (Addendum)
[facility]  Subjective  - no new complaints   Objective (!) 134/42 75 97.9 F (36.6 C) 18 100%  Intake/Output Summary (Last 24 hours) at 12/05/2021 0645 Last data filed at 12/04/2021 1435 Gross per 24 hour  Intake 340 ml  Output 0 ml  Net 340 ml   Left UE warm well without palpable radial pulse Left finger tip amp slow to heal Right UE palpable pulse    Assessment/Planning: POD # 1 Venogram right UE Pending Plan for new UE access.   Findings:  Right basilic vein widely patent. Paired brachial veins did not opacify likely due to the vein cannulated peripherally. The axillary vein was widely patent. The subclavian vein appears to have some level of stenosis from compression where the clavicle crossed the first rib.   Roxy Horseman 12/05/2021 6:45 AM --  Laboratory Lab Results: Recent Labs    12/03/21 0430 12/04/21 0738  WBC 15.6* 15.6*  HGB 8.5* 8.0*  HCT 27.4* 26.7*  PLT 223 267   BMET Recent Labs    12/02/21 0801 12/04/21 0738  NA 137 134*  K 4.2 4.0  CL 98 91*  CO2 22 23  GLUCOSE 188* 152*  BUN 66* 50*  CREATININE 9.89* 8.73*  CALCIUM 8.8* 9.3    COAG Lab Results  Component Value Date   INR 1.3 (H) 10/09/2021   INR 1.3 (H) 09/10/2021   INR 1.3 (H) 07/10/2021   No results found for: "PTT"   I have independently interviewed and examined patient and agree with PA assessment and plan above.  Imaging from yesterday reviewed and discussed with patient.  Appears to be a candidate for right upper extremity fistula versus graft and I have offered her procedure tomorrow, Tuesday or Friday of next week with me or I can arrange another day next week with one of my partners although Monday is a holiday.  She is going to talk with her family and hopefully notify us of her wishes to proceed later today.  Mahogony Gilchrest C. Donzetta Matters, MD [facility] Office: [phone] Pager: [phone]

## 2021-12-05 NOTE — Progress Notes (Signed)
Physical Therapy Treatment Patient Details Name: Courtney Gray MRN: [id number] DOB: [birth year] Today's Date: 12/05/2021   History of Present Illness Pt adm 7/28 with acute pulmonary edema. 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    PT Comments    Pt reports scary experience yesterday during HD when her BP dropped, likely due to too much fluid being removed. Pt reports Nephrologist would like more accurate weight. Pt request assistance with lifting out of bed and rezeroing the scale. Pt requiring min/mod Ax2 for rolling to place pad. Bed malfunction and unable to weigh patient. PT/Mobility Specialist procured [facility] and Maximove lift pad to utilize scale on lift. Pt requiring min/modAx2 for placement of new pad. Pt weighed at 275lb. RN in room during lifting and reports ability to weigh in future. Hospitalist and Nephrologist apprised of weight.  D/c plan remains appropriate at this time. PT will continue to follow acutely.    Recommendations for follow up therapy are one component of a multi-disciplinary discharge planning process, led by the attending physician.  Recommendations may be updated based on patient status, additional functional criteria and insurance authorization.  Follow Up Recommendations  Acute inpatient rehab (3hours/day) Can patient physically be transported by private vehicle: No   Assistance Recommended at Discharge Frequent or constant Supervision/Assistance  Patient can return home with the following Two people to help with walking and/or transfers;Two people to help with bathing/dressing/bathroom;Assist for transportation;Assistance with cooking/housework;Help with stairs or ramp for entrance   Equipment Recommendations  Wheelchair (measurements PT);Wheelchair cushion (measurements PT);Other (comment);Hospital bed       Precautions / Restrictions Precautions Precautions:  Fall Precaution Comments: bil AKA, 3L O2 at baseline, LUQ drain Restrictions Weight Bearing Restrictions: Yes RLE Weight Bearing: Non weight bearing LLE Weight Bearing: Non weight bearing Other Position/Activity Restrictions: minimized pushing/pulling and weight through L hand, dressing now removed     Mobility  Bed Mobility Overal bed mobility: Needs Assistance Bed Mobility: Rolling Rolling: Min assist, Mod assist         General bed mobility comments: performed multiple rolls back and forth for placment and removal of Maxisky and Maximove slings    Transfers Overall transfer level: Needs assistance Equipment used:  (maximove and maxisky) Transfers:  (up in air to weigh)             General transfer comment: lifted with maxisky to zero bed, bed scale malfunctioning, used Maximove to weigh pt Transfer via Lift Equipment: Maximove, Maxisky                 Balance Overall balance assessment: Needs assistance Sitting-balance support: No upper extremity supported Sitting balance-[name] Scale: Fair Sitting balance - Comments: able to balance and shift weight  in the wheelchair                                    Cognition Arousal/Alertness: Awake/alert Behavior During Therapy: WFL for tasks assessed/performed Overall Cognitive Status: Within Functional Limits for tasks assessed                                 General Comments: provided education about wound vac possibility to improve wound healing           General Comments General comments (skin integrity, edema,  etc.): VSS on 3L O2 via [place]      Pertinent Vitals/Pain Pain Assessment Pain Assessment: Faces Faces Pain Scale: Hurts little more Pain Location: R-ischial wound Pain Descriptors / Indicators: Discomfort, Guarding, Grimacing, Moaning, Sore, Sharp, Throbbing Pain Intervention(s): Limited activity within patient's tolerance, Monitored during session, Repositioned      PT Goals (current goals can now be found in the care plan section) Acute Rehab PT Goals Patient Stated Goal: ultimately wants to be modified independent at w/c level PT Goal Formulation: With patient Time For Goal Achievement: 12/03/21 Potential to Achieve Goals: Fair Progress towards PT goals: Progressing toward goals    Frequency    Min 3X/week      PT Plan Current plan remains appropriate       AM-PAC PT "6 Clicks" Mobility   Outcome Measure  Help needed turning from your back to your side while in a flat bed without using bedrails?: A Lot Help needed moving from lying on your back to sitting on the side of a flat bed without using bedrails?: Total Help needed moving to and from a bed to a chair (including a wheelchair)?: Total Help needed standing up from a chair using your arms (e.g., wheelchair or bedside chair)?: Total Help needed to walk in hospital room?: Total Help needed climbing 3-5 steps with a railing? : Total 6 Click Score: 7    End of Session Equipment Utilized During Treatment: Oxygen Activity Tolerance: Patient tolerated treatment well Patient left: in bed;with call bell/phone within reach Nurse Communication: Mobility status PT Visit Diagnosis: Other abnormalities of gait and mobility (R26.89);Muscle weakness (generalized) (M62.81) Pain - Right/Left: Right Pain - part of body: Leg     Time: 2633-3545 PT Time Calculation (min) (ACUTE ONLY): 71 min  Charges:  $Therapeutic Activity: 68-82 mins                     Courtney Gray B. Migdalia Dk PT, DPT Acute Rehabilitation Services Please use secure chat or  Call Office [phone]    [place] 12/05/2021, 4:06 PM

## 2021-12-05 NOTE — Progress Notes (Signed)
[facility] Progress Note   Subjective:   In good spirits today. Denies SOB, CP, dizziness, abdominal pain and nausea.   Objective Vitals:   12/04/21 1328 12/04/21 1540 12/04/21 2048 12/05/21 0500  BP:  (!) 127/57 (!) 134/42 (!) 130/58  Pulse:  70 75 71  Resp:  18 18 18   Temp:  98.8 F (37.1 C) 97.9 F (36.6 C) 98.4 F (36.9 C)  TempSrc:  Oral  Oral  SpO2: 100% 96% 100% 100%  Weight:      Height:       Physical Exam General: Alert female in NAD Heart: RRR, no murmurs, rubs or gallops Lungs: CTA bilaterally Abdomen: Obese abdomen, + BS Extremities: b/l AKA, no significant edema Dialysis Access: Left IJ tunneled PermCath   Additional Objective Labs: Basic Metabolic Panel: Recent Labs  Lab 11/30/21 0748 12/02/21 0801 12/04/21 0738  NA 135 137 134*  K 4.2 4.2 4.0  CL 99 98 91*  CO2 21* 22 23  GLUCOSE 107* 188* 152*  BUN 44* 66* 50*  CREATININE 6.79* 9.89* 8.73*  CALCIUM 9.0 8.8* 9.3  PHOS 7.8* 9.3* 8.8*   Liver Function Tests: Recent Labs  Lab 11/30/21 0748 12/02/21 0801 12/04/21 0738  ALBUMIN 2.3* 2.2* 2.2*   No results for input(s): "LIPASE", "AMYLASE" in the last 168 hours. CBC: Recent Labs  Lab 11/29/21 1810 11/30/21 0748 12/02/21 1022 12/03/21 0430 12/04/21 0738  WBC 16.4* 14.6* 14.9* 15.6* 15.6*  NEUTROABS 12.8* 10.5*  --  10.5*  --   HGB 8.8* 9.0* 7.8* 8.5* 8.0*  HCT 27.9* 28.6* 25.2* 27.4* 26.7*  MCV 95.5 95.3 97.3 96.1 99.3  PLT 219 216 245 223 267   Blood Culture    Component Value Date/Time   SDES BLOOD LEFT ANTECUBITAL 10/16/2021 1534   SPECREQUEST  10/16/2021 1534    BOTTLES DRAWN AEROBIC AND ANAEROBIC Blood Culture results may not be optimal due to an inadequate volume of blood received in culture bottles   CULT  10/16/2021 1534    NO GROWTH 5 DAYS Performed at [facility], [place] [address], [place], [place] [address]    REPTSTATUS 10/21/2021 FINAL 10/16/2021 1534    Cardiac Enzymes: No results for  input(s): "CKTOTAL", "CKMB", "CKMBINDEX", "TROPONINI" in the last 168 hours. CBG: Recent Labs  Lab 12/03/21 2237 12/04/21 1043 12/04/21 1536 12/04/21 2105 12/05/21 0748  GLUCAP 92 108* 165* 147* 184*   Iron Studies:  Recent Labs    12/04/21 0738  FERRITIN 1,431*   @lablastinr3 @ Studies/Results: VAS [place] UPPER EXTREMITY ARTERIAL DUPLEX  Result Date: 12/03/2021  UPPER EXTREMITY DUPLEX STUDY Patient Name:  SIDNEY SILBERMAN  Date of Exam:   12/03/2021 Medical Rec #: [id number]        Accession #:    [id number] Date of Birth: 1971-03-13         Patient Gender: F Patient Age:   23 years Exam Location:  [facility] Procedure:      VAS [place] UPPER EXTREMITY ARTERIAL DUPLEX Referring Phys: Servando Snare --------------------------------------------------------------------------------  Indications: End stage renal disease.  Limitations: Depth of vessels, body habitus Comparison Study: No previous exam noted. Performing Technologist: Bobetta Lime BS, RVT  Examination Guidelines: A complete evaluation includes B-mode imaging, spectral Doppler, color Doppler, and power Doppler as needed of all accessible portions of each vessel. Bilateral testing is considered an integral part of a complete examination. Limited examinations for reoccurring indications may be performed as noted.  Right Doppler Findings: +---------------+----------+---------+--------+--------+ Site  PSV (cm/s)Waveform StenosisComments +---------------+----------+---------+--------+--------+ Subclavian Mid 77        triphasic                 +---------------+----------+---------+--------+--------+ Subclavian Dist          triphasic                 +---------------+----------+---------+--------+--------+ Axillary       124       triphasic                 +---------------+----------+---------+--------+--------+ Brachial Prox  72        triphasic                  +---------------+----------+---------+--------+--------+ Brachial Mid   72        triphasic                 +---------------+----------+---------+--------+--------+ Brachial Dist  53        triphasic                 +---------------+----------+---------+--------+--------+ Radial Prox    14        triphasic                 +---------------+----------+---------+--------+--------+ Radial Mid     41        triphasic                 +---------------+----------+---------+--------+--------+ Radial Dist    25        triphasic                 +---------------+----------+---------+--------+--------+ Ulnar Prox     48        triphasic                 +---------------+----------+---------+--------+--------+ Ulnar Mid      66        triphasic                 +---------------+----------+---------+--------+--------+ Ulnar Dist     76        triphasic                 +---------------+----------+---------+--------+--------+    Summary:  Right: No obstruction visualized in the right upper extremity. *See table(s) above for measurements and observations. Electronically signed by Deitra Mayo MD on 12/03/2021 at 4:56:33 PM.    Final    VAS [place] UPPER EXT VEIN MAPPING (PRE-OP AVF)  Result Date: 12/03/2021 UPPER EXTREMITY VEIN MAPPING Patient Name:  LUDY MESSAMORE  Date of Exam:   12/03/2021 Medical Rec #: [id number]        Accession #:    [id number] Date of Birth: 11-Feb-1971         Patient Gender: F Patient Age:   51 years Exam Location:  [facility] Procedure:      VAS [place] UPPER EXT VEIN MAPPING (PRE-OP AVF) Referring Phys: Anice Paganini --------------------------------------------------------------------------------  Indications: Pre-access. Limitations: Body habitus Comparison Study: No previous exam noted. Performing Technologist: Bobetta Lime BS, RVT  Examination Guidelines: A complete evaluation includes B-mode imaging, spectral Doppler, color Doppler, and power  Doppler as needed of all accessible portions of each vessel. Bilateral testing is considered an integral part of a complete examination. Limited examinations for reoccurring indications may be performed as noted. +-----------------+-------------+----------+-----------------------------------+ Right Cephalic   Diameter (cm)Depth (cm)             Findings               +-----------------+-------------+----------+-----------------------------------+  Shoulder             0.31        2.07                                       +-----------------+-------------+----------+-----------------------------------+ Prox upper arm       0.29        1.07                                       +-----------------+-------------+----------+-----------------------------------+ Mid upper arm                             Non-compressible/former outflow                                                   vein for failed AVF         +-----------------+-------------+----------+-----------------------------------+ Dist upper arm                                   Non-compressible           +-----------------+-------------+----------+-----------------------------------+ Antecubital fossa                                Non-compressible           +-----------------+-------------+----------+-----------------------------------+ Prox forearm         0.14        1.10                                       +-----------------+-------------+----------+-----------------------------------+ Mid forearm          0.17        1.28                                       +-----------------+-------------+----------+-----------------------------------+ Dist forearm                                      not visualized            +-----------------+-------------+----------+-----------------------------------+ Wrist                                             not visualized             +-----------------+-------------+----------+-----------------------------------+ +-----------------+-------------+----------+--------------+ Right Basilic    Diameter (cm)Depth (cm)   Findings    +-----------------+-------------+----------+--------------+ Dist upper arm       0.47        2.42                  +-----------------+-------------+----------+--------------+ Antecubital fossa    0.17  2.92                  +-----------------+-------------+----------+--------------+ Prox forearm                            not visualized +-----------------+-------------+----------+--------------+ Mid forearm                             not visualized +-----------------+-------------+----------+--------------+ Distal forearm                          not visualized +-----------------+-------------+----------+--------------+ +-----------------+-------------+----------+----------------------------+ Left Cephalic    Diameter (cm)Depth (cm)          Findings           +-----------------+-------------+----------+----------------------------+ Shoulder             0.27        2.48                                +-----------------+-------------+----------+----------------------------+ Prox upper arm       0.21        1.98                                +-----------------+-------------+----------+----------------------------+ Mid upper arm        0.28        1.58                                +-----------------+-------------+----------+----------------------------+ Dist upper arm       0.22        1.63                                +-----------------+-------------+----------+----------------------------+ Antecubital fossa    0.39        1.33                                +-----------------+-------------+----------+----------------------------+ Prox forearm                            branching and not visualized  +-----------------+-------------+----------+----------------------------+ Mid forearm                                    not visualized        +-----------------+-------------+----------+----------------------------+ Dist forearm                                   not visualized        +-----------------+-------------+----------+----------------------------+ +-----------------+-------------+----------+--------------+ Left Basilic     Diameter (cm)Depth (cm)   Findings    +-----------------+-------------+----------+--------------+ Dist upper arm       0.49        2.28                  +-----------------+-------------+----------+--------------+ Antecubital fossa    0.35        2.49                  +-----------------+-------------+----------+--------------+  Prox forearm         0.30        3.59                  +-----------------+-------------+----------+--------------+ Mid forearm                             not visualized +-----------------+-------------+----------+--------------+ Distal forearm                          not visualized +-----------------+-------------+----------+--------------+ *See table(s) above for measurements and observations.  Diagnosing physician: Deitra Mayo MD Electronically signed by Deitra Mayo MD on 12/03/2021 at 4:56:21 PM.    Final    Medications:  ferric gluconate (FERRLECIT) IVPB Stopped (12/02/21 1318)    aspirin EC  81 mg Oral Daily   atorvastatin  10 mg Oral Daily   Chlorhexidine Gluconate Cloth  6 each Topical Q0600   [START ON 12/09/2021] darbepoetin (ARANESP) injection - DIALYSIS  150 mcg Intravenous Q Mon-HD   feeding supplement  1 Container Oral TID BM   gabapentin  100 mg Oral Q1200   gabapentin  200 mg Oral QHS   heparin  5,000 Units Subcutaneous Q8H   insulin aspart  0-5 Units Subcutaneous QHS   insulin aspart  0-6 Units Subcutaneous TID WC   leptospermum manuka honey  1 Application Topical Daily    levothyroxine  100 mcg Oral Q0600   lubiprostone  8 mcg Oral Once per day on Sun Tue Thu Sat   metoprolol tartrate  12.5 mg Oral BID   midodrine  10 mg Oral Q M,W,F-HD   pantoprazole  40 mg Oral Daily   sevelamer carbonate  1.6 g Oral TID WC   simethicone  80 mg Oral TID AC & HS    Outpatient Dialysis Orders: MWF - [facility]  4hrs, BFR 400, DFR 600,  AVF, EDW 146kg, 3K/ 2Ca - Heparin 2500 units with HD - Hectorol 90mcg IV qHD, held as of 10/30/21  Assessment/Plan: ESRD: HD MWF. [place] did work well last two dialysis treatments.  2. Dialysis PermCath malfunction: Has required two [facility] exchanges. IR thinks TDC is positional, did not have fibrin at last exchange. Discussed the need for a long term solution and patient does agree to a vascular surgery consult. Appreciate VVS assistance 3. Hypertension/volume: noted on metoprolol 12.5 mg twice daily, does not appear to have significant excess volume and BP dropped with UF yesterday. Decrease UF goal to 1L tomorrow. On O2 chronically. Wide variation in bed weights 4. Chronic respiratory failure with hypoxia on cannula oxygen at home: on 5L O2 at baseline 5. Anemia: Hgb 8.0. Aranesp dose increased. Iron replete, tsat 48% and ferritin borderline high. Stopped IV Fe for now.  6. Secondary hyperparathyroidism: corrected calcium has been high, vitamin D on hold. Phos elevated, on renvela, increasing dose 7. PAD: status post bilateral AKA for gangrene follow-up Dr. Stanford Breed, status post left middle finger amputation for gangrene 8. Disposition: Difficult situation recently: Patient required 4 staff members and 2 hoyer pads for transfer into HD chair as OP. Could not stay on dialysis for full treatment because of pain of multiple decubitus ulcers, SW working on Marine scientist HD  Anice Paganini, PA-C 12/05/2021, 9:02 AM  [place] Pager: [phone]

## 2021-12-05 NOTE — Progress Notes (Signed)
Case discussed at length with clinic manager at [facility] SW yesterday. Clinic manager discussed case with medical director. Clinic is willing to consider accepting pt back under several conditions: pt must be transported to HD via w/c (no stretcher),pt must have a hoyer pad/sling available to meet pt's weight requirements, pt must be able to tolerate HD in chair prior to d/c, and the wheelchair that pt will use will need to be able to enter through clinic's front door. This info was provided to CSW. Will continue to follow and assist with pt's out-pt HD needs.   Melven Sartorius Renal Navigator [phone]

## 2021-12-06 ENCOUNTER — Encounter (HOSPITAL_COMMUNITY): Payer: Self-pay | Admitting: Vascular Surgery

## 2021-12-06 DIAGNOSIS — N186 End stage renal disease: Secondary | ICD-10-CM | POA: Diagnosis not present

## 2021-12-06 DIAGNOSIS — I739 Peripheral vascular disease, unspecified: Secondary | ICD-10-CM | POA: Diagnosis not present

## 2021-12-06 DIAGNOSIS — I10 Essential (primary) hypertension: Secondary | ICD-10-CM | POA: Diagnosis not present

## 2021-12-06 LAB — CBC
HCT: 25.2 % — ABNORMAL LOW (ref 36.0–46.0)
Hemoglobin: 7.5 g/dL — ABNORMAL LOW (ref 12.0–15.0)
MCH: 29.8 pg (ref 26.0–34.0)
MCHC: 29.8 g/dL — ABNORMAL LOW (ref 30.0–36.0)
MCV: 100 fL (ref 80.0–100.0)
Platelets: 271 10*3/uL (ref 150–400)
RBC: 2.52 MIL/uL — ABNORMAL LOW (ref 3.87–5.11)
RDW: 15.5 % (ref 11.5–15.5)
WBC: 14.5 10*3/uL — ABNORMAL HIGH (ref 4.0–10.5)
nRBC: 0 % (ref 0.0–0.2)

## 2021-12-06 LAB — RENAL FUNCTION PANEL
Albumin: 2.1 g/dL — ABNORMAL LOW (ref 3.5–5.0)
Anion gap: 13 (ref 5–15)
BUN: 39 mg/dL — ABNORMAL HIGH (ref 6–20)
CO2: 25 mmol/L (ref 22–32)
Calcium: 8.7 mg/dL — ABNORMAL LOW (ref 8.9–10.3)
Chloride: 97 mmol/L — ABNORMAL LOW (ref 98–111)
Creatinine, Ser: 7.59 mg/dL — ABNORMAL HIGH (ref 0.44–1.00)
GFR, Estimated: 6 mL/min — ABNORMAL LOW (ref >60)
Glucose, Bld: 177 mg/dL — ABNORMAL HIGH (ref 70–99)
Phosphorus: 7.7 mg/dL — ABNORMAL HIGH (ref 2.5–4.6)
Potassium: 3.4 mmol/L — ABNORMAL LOW (ref 3.5–5.1)
Sodium: 135 mmol/L (ref 135–145)

## 2021-12-06 LAB — GLUCOSE, CAPILLARY
Glucose-Capillary: 99 mg/dL (ref 70–99)
Glucose-Capillary: 88 mg/dL (ref 70–99)
Glucose-Capillary: 135 mg/dL — ABNORMAL HIGH (ref 70–99)
Glucose-Capillary: 96 mg/dL (ref 70–99)

## 2021-12-06 MED ORDER — HEPARIN SODIUM (PORCINE) 1000 UNIT/ML DIALYSIS: 20.0000 [IU]/kg | INTRAMUSCULAR | Status: DC | PRN

## 2021-12-06 MED ORDER — HEPARIN SODIUM (PORCINE) 1000 UNIT/ML IJ SOLN
INTRAMUSCULAR | Status: AC
  Administered 2021-12-06: 1000 [IU]
  Filled 2021-12-06: qty 4

## 2021-12-06 MED ORDER — HEPARIN SODIUM (PORCINE) 1000 UNIT/ML IJ SOLN
INTRAMUSCULAR | Status: AC
  Administered 2021-12-06: 1000 [IU]
  Filled 2021-12-06: qty 3

## 2021-12-06 NOTE — Progress Notes (Addendum)
Patient weight bed scale is not working,''it needs to be calibrated as it appears on bed screen"per  PT note ,patient was 125kg(275 KG) -hoyer lift  wt.Would make the  floor nurse aware of patient's bed wt scale problem.

## 2021-12-06 NOTE — TOC Progression Note (Signed)
Transition of Care [facility]) - Initial/Assessment Note    Patient Details  Name: Courtney Gray MRN: [id number] Date of Birth: 11/17/1970  Transition [facility] CM/SW Contact:    Milinda Antis, [place] Phone Number: 12/06/2021, 3:52 PM  Clinical Narrative:                 Pending an outpatient dialysis clinic that provides stretcher dialysis.  Renal Navigator following and working to locate a facility.  Patient updated   TOC will continue to follow .   Expected Discharge Plan: Long Term Nursing Home Barriers to Discharge: No SNF bed, Transportation, Waiting for outpatient dialysis   Patient Goals and CMS Choice Patient states their goals for this hospitalization and ongoing recovery are:: To be at a facility close to her daughter      Expected Discharge Plan and Services Expected Discharge Plan: [place]       Living arrangements for the past 2 months: Single Family Home                                      Prior Living Arrangements/Services Living arrangements for the past 2 months: Single Family Home Lives with:: Adult Children Patient language and need for interpreter reviewed:: Yes        Need for Family Participation in Patient Care: Yes (Comment) Care giver support system in place?: No (comment)   Criminal Activity/Legal Involvement Pertinent to Current Situation/Hospitalization: No - Comment as needed  Activities of Daily Living Home Assistive Devices/Equipment: [employer] fast streamer ADL Screening (condition at time of admission) Patient's cognitive ability adequate to safely complete daily activities?: No Is the patient deaf or have difficulty hearing?: No Does the patient have difficulty seeing, even when wearing glasses/contacts?: No Does the patient have difficulty concentrating, remembering, or making decisions?: No Patient able to express need for assistance with ADLs?: Yes Does the patient have difficulty dressing or bathing?:  Yes Independently performs ADLs?: No Does the patient have difficulty walking or climbing stairs?: Yes Weakness of Legs: Both Weakness of Arms/Hands: Both  Permission Sought/Granted Permission sought to share information with : Other (comment) Permission granted to share information with : Yes, Verbal Permission Granted  Share Information with NAME: [place],antoinette (Daughter)   [phone]  Permission granted to share info w AGENCY: SNF        Emotional Assessment Appearance:: Appears older than stated age Attitude/Demeanor/Rapport: Engaged Affect (typically observed): Tearful/Crying, Accepting Orientation: : Oriented to Situation, Oriented to  Time, Oriented to Place, Oriented to Self Alcohol / Substance Use: Not Applicable Psych Involvement: No (comment)  Admission diagnosis:  End stage renal disease ([place]) [N18.6] Fluid overload [E87.70] Obesity with serious comorbidity, unspecified classification, unspecified obesity type [E66.9] Pressure injury of right buttock, stage 2 (HCC) [L89.312] Patient Active Problem List   Diagnosis Date Noted   Hypertension    Type 2 diabetes mellitus with hyperlipidemia ([place])    Decubitus ulcer of sacral region, stage 2 ([place])    Class 3 obesity ([place])    Bacteremia    PVD (peripheral vascular disease) ([place])    Hepatic abscess    Pressure injury of skin 09/13/2021   Hx of AKA (above knee amputation), right ([place])    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   Lactic acidosis 09/10/2021   Mixed diabetic hyperlipidemia associated with type 2 diabetes mellitus ([place]) 09/10/2021   Coronary artery disease involving native coronary artery of  native heart without angina pectoris 09/10/2021   Elevated troponin level not due myocardial infarction 09/10/2021   Hypotension 09/10/2021   Dry gangrene (HCC)    Pain 08/03/2021   Ischemic foot pain at rest 08/03/2021   Hypoglycemia 07/10/2021   Volume overload 07/09/2021   Leukocytosis 06/11/2021   Acute gout  06/11/2021   Hyperkalemia 05/21/2021   Hemorrhoids    Hyperlipidemia 05/18/2021   Rectal bleeding 05/17/2021   Morbid obesity with BMI of 60.0-69.9, adult ([place]) 05/17/2021   NSTEMI (non-ST elevated myocardial infarction) ([place]) 05/16/2021   Essential hypertension 09/01/2020   End stage renal disease ([place]) 09/01/2020   Chronic respiratory failure with hypoxia ([place]) 09/01/2020   Hypertensive urgency 11/28/2019   Uncontrolled type 2 diabetes mellitus with hypoglycemia, with long-term current use of insulin ([place]) 11/28/2019   Anemia of chronic disease 11/28/2019   PCP:  Monico Blitz, MD Pharmacy:   [facility], [place] [address] [place] [address] Phone: [phone] Fax: [phone]  [facility] [address] [place] [address] Phone: [phone] Fax: [phone]  [place], [place] - [address] [place] [address] Phone: [phone] Fax: [phone]     Social Determinants of Health ([place]) Interventions    Readmission Risk Interventions    09/16/2021    4:36 PM 08/28/2021   11:50 AM 07/12/2021   12:38 PM  Readmission Risk Prevention Plan  Transportation Screening Complete Complete Complete  Medication Review (RN Care Manager)  Complete Complete  PCP or Specialist appointment within 3-5 days of discharge  Complete Complete  HRI or Home Care Consult Complete Complete Complete  SW Recovery Care/Counseling Consult Complete Complete Complete  Palliative Care Screening  Not Applicable Not Applicable  Skilled Nursing Facility Complete Not Applicable Complete

## 2021-12-06 NOTE — Progress Notes (Signed)
Last 40 minutes of her HD treatment ,patient's HD machine tube system clotted.Renal  PA at bedside.Patient has an order to come back tomorrow for another round of treatment.Floor nurse made aware of patient 's bed weight scale.

## 2021-12-06 NOTE — Progress Notes (Signed)
Occupational Therapy Treatment Patient Details Name: Courtney Gray MRN: [id number] DOB: 1970-09-14 Today's Date: 12/06/2021   History of present illness Pt adm 7/28 with acute pulmonary edema. 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   OT comments  Pt involved in establishing updated goals for OT. Pt stating she wants to function independently from a w/c level and return home, but unable to determine short term goals due to wounds and pain. Pt with concerns that bed mobility and sitting goals not possible with current pressure relieving mattress. Decided we would extend current goals. Pt participated in UB exercises with blue theraband. Instructed pt to do 3 x a day on her own.    Recommendations for follow up therapy are one component of a multi-disciplinary discharge planning process, led by the attending physician.  Recommendations may be updated based on patient status, additional functional criteria and insurance authorization.    Follow Up Recommendations  Skilled nursing-short term rehab (<3 hours/day)    Assistance Recommended at Discharge Intermittent Supervision/Assistance  Patient can return home with the following  Two people to help with walking and/or transfers;Two people to help with bathing/dressing/bathroom;Assistance with cooking/housework;Direct supervision/assist for medications management;Assist for transportation;Help with stairs or ramp for entrance   Equipment Recommendations  Other (comment) (defer to next venue)    Recommendations for Other Services      Precautions / Restrictions Precautions Precautions: Fall Precaution Comments: bil AKA, 3L O2 at baseline, LUQ drain Restrictions Weight Bearing Restrictions: Yes RLE Weight Bearing: Non weight bearing LLE Weight Bearing: Non weight bearing       Mobility Bed Mobility               General bed mobility  comments: pt declined bed mobility, reports her ischial dressing is coming off, RN aware    Transfers                         Balance                                           ADL either performed or assessed with clinical judgement   ADL Overall ADL's : Needs assistance/impaired Eating/Feeding: Set up;Sitting Eating/Feeding Details (indicate cue type and reason): assist to open containers                                        Extremity/Trunk Assessment              Vision       Perception     Praxis      Cognition Arousal/Alertness: Awake/alert Behavior During Therapy: WFL for tasks assessed/performed Overall Cognitive Status: Within Functional Limits for tasks assessed                                 General Comments: pt continues to be resistant to wound vac, fears pain        Exercises Exercises: General Upper Extremity General Exercises - Upper Extremity Shoulder Flexion: Strengthening, Right, 10 reps, Theraband, Supine Theraband Level (Shoulder Flexion): Level 4 (Blue) Shoulder Horizontal ABduction: Both, 10 reps, Supine, Theraband,  Strengthening Theraband Level (Shoulder Horizontal Abduction): Level 4 (Blue) Elbow Flexion: Strengthening, Both, 10 reps, Supine, Theraband Theraband Level (Elbow Flexion): Level 4 (Blue) Elbow Extension: Strengthening, Both, 10 reps, Supine, Theraband Theraband Level (Elbow Extension): Level 4 (Blue)    Shoulder Instructions       General Comments      Pertinent Vitals/ Pain       Pain Assessment Pain Assessment: Faces Faces Pain Scale: Hurts little more Pain Location: R-ischial wound Pain Descriptors / Indicators: Discomfort, Guarding, Grimacing, Moaning, Sore, Sharp, Throbbing Pain Intervention(s): Monitored during session, Premedicated before session, Repositioned  Home Living                                          Prior  Functioning/Environment              Frequency  Min 2X/week        Progress Toward Goals  OT Goals(current goals can now be found in the care plan section)  Progress towards OT goals: Progressing toward goals  Acute Rehab OT Goals OT Goal Formulation: With patient Time For Goal Achievement: 12/20/21 Potential to Achieve Goals: Fair ADL Goals Pt Will Perform Grooming: Independently;sitting;bed level Pt Will Transfer to Toilet: with mod assist;with transfer board;bedside commode Pt/caregiver will Perform Home Exercise Program: Increased strength;Both right and left upper extremity;With theraband;Independently Additional ADL Goal #1: Pt will perform bed mobility with supervision in prep for ADLs Additional ADL Goal #2: Pt will tolerate sitting up in wheelchair/recliner for 1.5 hours in order to improve activity tolerance for ADLs/mobility.  Plan Discharge plan remains appropriate;Frequency remains appropriate    Co-evaluation                 AM-PAC OT "6 Clicks" Daily Activity     Outcome Measure   Help from another person eating meals?: A Little Help from another person taking care of personal grooming?: A Little Help from another person toileting, which includes using toliet, bedpan, or urinal?: Total Help from another person bathing (including washing, rinsing, drying)?: A Lot Help from another person to put on and taking off regular upper body clothing?: A Little Help from another person to put on and taking off regular lower body clothing?: A Lot 6 Click Score: 14    End of Session Equipment Utilized During Treatment: Oxygen  OT Visit Diagnosis: Muscle weakness (generalized) (M62.81);History of falling (Z91.81)   Activity Tolerance Patient limited by pain   Patient Left in bed;with call bell/phone within reach   Nurse Communication Other (comment) (ok to have grape juice)        Time: 1450-1530 OT Time Calculation (min): 40 min  Charges: OT  General Charges $OT Visit: 1 Visit OT Treatments $Self Care/Home Management : 8-22 mins $Therapeutic Exercise: 8-22 mins  Cleta Alberts, OTR/L Acute Rehabilitation Services Office: [phone]   Courtney Gray 12/06/2021, 4:05 PM

## 2021-12-06 NOTE — Progress Notes (Signed)
PROGRESS NOTE    Courtney Gray  XAJ:[id number] DOB: 01/24/1971 DOA: 11/01/2021 PCP: Monico Blitz, MD   Brief Narrative: Courtney Gray is a 51 y.o. female with a history of ESRD on HD, chronic heart failure, PAD, bilateral AKA, diabetes mellitus, morbid obesity, pressure injury. Patient presented secondary to shortness of breath which was consistent with acute heart failure that responded to hemodialysis. Discharge is pending ability to obtain outpatient hemodialysis that can accommodate patient's mobility constraints.  Assessment and Plan:  Acute on chronic diastolic heart failure Present on admission with associated pulmonary edema. Treated with hemodialysis. Acute process resolved. -Continue fluid management with hemodialysis.  Chronic respiratory failure Stable.  Acute pulmonary edema Secondary to acute heart failure and managed with hemodialysis.  Hypertensive emergency BP of 181/81 mmHg with associated acute heart failure on admission. Started on Imdur and managed with hemodialysis. Resolved.  ESRD on HD Nephrology consulted and are managing hemodialysis. Vascular surgery on board for consideration of AV fistula vs graft. Plan for right upper arm.  Anemia of chronic kidney disease Hemoglobin stable.  Diabetes mellitus type 2 Well controlled on insulin. Hemoglobin A1C of 5.7%. 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 -Continue aspirin  Hypomagnesemia Supplementation given.  Leukocytosis Mild and stable. No obvious evidence of acute infection. Loculated left pleural effusion noted on CT scan on 8/9. Patient is afebrile. -Will consider possible repeat chest CT scan to ensure improvement of loculated pleural effusion if leukocytosis remains and no other source is identified  Irritable bowel syndrome with constipation -Continue Amitiza  History of septic shock  secondary to bacteroides bacteremia with liver and splenic abscesses Diagnosed on prior hospitalization. Patient completed antibiotic course this admission. Abdominal drain removed by IR on 8/9.  Morbid obesity Body mass index is 45.76 kg/m.  Pressure injury Right buttocks/right hip present on admission. Left/lower flank, not present on admission.   DVT prophylaxis: heparin subq Code Status:   Code Status: Full Code Family Communication: None at bedside Disposition Plan: Discharge to SNF pending ability to receive outpatient HD   Consultants:  Nephrology Interventional radiology Infectious disease Vascular surgery  Procedures:  Exchange of left IJ tunneled HD cath (8/22) Right upper extremity venogram/Central venogram (8/30)  Antimicrobials: Vancomycin Cefepime Flagyl    Subjective: Patient in bed receiving HD. No concerns today, per patient. Patient states she and her daughter have not yet considered when to have fistula creation.  Objective: BP 121/61 (BP Location: Left Wrist)   Pulse 71   Temp 98.3 F (36.8 C) (Oral)   Resp (!) 21   Ht 5\' 5"  (1.651 m)   Wt 124.7 kg   SpO2 100%   BMI 45.76 kg/m   Examination:  General exam: Appears calm and comfortable Respiratory system: Respiratory effort normal. Gastrointestinal system: Abdomen is non-distended. Central nervous system: Alert and oriented. Musculoskeletal: Bilateral AKA Psychiatry: Judgement and insight appear normal. Mood & affect appropriate.    Data Reviewed: I have personally reviewed following labs and imaging studies  CBC Lab Results  Component Value Date   WBC 15.6 (H) 12/04/2021   RBC 2.69 (L) 12/04/2021   HGB 8.0 (L) 12/04/2021   HCT 26.7 (L) 12/04/2021   MCV 99.3 12/04/2021   MCH 29.7 12/04/2021   PLT 267 12/04/2021   MCHC 30.0 12/04/2021   RDW 15.7 (H) 12/04/2021   LYMPHSABS 1.5 12/03/2021   MONOABS 2.0 (H) 12/03/2021   EOSABS 1.1 (  H) 12/03/2021   BASOSABS 0.1 12/03/2021      Last metabolic panel Lab Results  Component Value Date   NA 134 (L) 12/04/2021   K 4.0 12/04/2021   CL 91 (L) 12/04/2021   CO2 23 12/04/2021   BUN 50 (H) 12/04/2021   CREATININE 8.73 (H) 12/04/2021   GLUCOSE 152 (H) 12/04/2021   GFRNONAA 5 (L) 12/04/2021   GFRAA 6 (L) 11/29/2019   CALCIUM 9.3 12/04/2021   PHOS 8.8 (H) 12/04/2021   PROT 6.9 11/06/2021   ALBUMIN 2.2 (L) 12/04/2021   BILITOT 0.9 11/06/2021   ALKPHOS 62 11/06/2021   AST 28 11/06/2021   ALT 7 11/06/2021   ANIONGAP 20 (H) 12/04/2021    GFR: Estimated Creatinine Clearance: 10.1 mL/min (A) (by C-G formula based on SCr of 8.73 mg/dL (H)).  No results found for this or any previous visit (from the past 240 hour(s)).    Radiology Studies: PERIPHERAL VASCULAR CATHETERIZATION  Result Date: 12/05/2021 Images from the original result were not included.   Patient name: Courtney Gray  MRN: [id number]       DOB: 1970-12-21            Sex: female  12/04/2021 Pre-operative Diagnosis: End stage renal disease Post-operative diagnosis:  Same Surgeon:  Broadus John, MD Procedure Performed: 1.  Right upper extremity venogram 2.  Central venogram    Indications: Patient is a 51 year old female with end-stage renal disease requiring long-term HD access.  She has had multiple access procedures.  There is concern she may not have adequate axillary vein in the right upper extremity for AV graft placement.  After discussing the risks and benefits of right upper extremity venogram to define venous outflow, Amadi elected to proceed.  Findings: Right basilic vein widely patent. Paired brachial veins did not opacify likely due to the vein cannulated peripherally. The axillary vein was widely patent. The subclavian vein appears to have some level of stenosis from compression where the clavicle crossed the first rib.             Procedure:  Patient was brought to the Cath Lab and laid in the supine position.  A small amount of pain medication  was administered due to bedsores.  Right upper extremity venogram followed from previously placed IV.   Cassandria Santee, MD [facility] Office: [phone]       LOS: 26 days    Cordelia Poche, MD [facility] 12/06/2021, 9:24 AM   If 7PM-7AM, please contact night-coverage [email or web address]

## 2021-12-06 NOTE — Progress Notes (Signed)
[facility] Progress Note   Subjective: On HD with hypotension, refusing midodrine. Long talk with patient regarding medications and treatment. She agrees to take Midodrine. No specific complaints. Short extra tx tomorrow for more volume removal.    Objective Vitals:   12/06/21 0615 12/06/21 0856 12/06/21 0907 12/06/21 0931  BP: (!) 104/59 121/61 97/86 (!) 78/43  Pulse: 75 71 68   Resp: 18 (!) 21 (!) 22 19  Temp: 97.6 F (36.4 C) 98.3 F (36.8 C)    TempSrc: Oral Oral    SpO2: 100% 100% 100%   Weight:      Height:       Physical Exam General: Chronically ill appearing obese female in NAD Heart: HS distant. S1,S2 no M/R/G. SR on monitor.  Lungs: CTAB Anteriorly. No WOB.  Abdomen: Obese, NT, NABS Extremities:Bilateral BKA no stump edema Dialysis Access: LIJ TDC drsg intact.   Additional Objective Labs: Basic Metabolic Panel: Recent Labs  Lab 11/30/21 0748 12/02/21 0801 12/04/21 0738  NA 135 137 134*  K 4.2 4.2 4.0  CL 99 98 91*  CO2 21* 22 23  GLUCOSE 107* 188* 152*  BUN 44* 66* 50*  CREATININE 6.79* 9.89* 8.73*  CALCIUM 9.0 8.8* 9.3  PHOS 7.8* 9.3* 8.8*   Liver Function Tests: Recent Labs  Lab 11/30/21 0748 12/02/21 0801 12/04/21 0738  ALBUMIN 2.3* 2.2* 2.2*   No results for input(s): "LIPASE", "AMYLASE" in the last 168 hours. CBC: Recent Labs  Lab 11/29/21 1810 11/30/21 0748 12/02/21 1022 12/03/21 0430 12/04/21 0738 12/06/21 0855  WBC 16.4* 14.6* 14.9* 15.6* 15.6* 14.5*  NEUTROABS 12.8* 10.5*  --  10.5*  --   --   HGB 8.8* 9.0* 7.8* 8.5* 8.0* 7.5*  HCT 27.9* 28.6* 25.2* 27.4* 26.7* 25.2*  MCV 95.5 95.3 97.3 96.1 99.3 100.0  PLT 219 216 245 223 267 271   Blood Culture    Component Value Date/Time   SDES BLOOD LEFT ANTECUBITAL 10/16/2021 1534   SPECREQUEST  10/16/2021 1534    BOTTLES DRAWN AEROBIC AND ANAEROBIC Blood Culture results may not be optimal due to an inadequate volume of blood received in culture bottles   CULT   10/16/2021 1534    NO GROWTH 5 DAYS Performed at [facility], [place] [address] [place], [place] [address]    REPTSTATUS 10/21/2021 FINAL 10/16/2021 1534    Cardiac Enzymes: No results for input(s): "CKTOTAL", "CKMB", "CKMBINDEX", "TROPONINI" in the last 168 hours. CBG: Recent Labs  Lab 12/05/21 0748 12/05/21 1141 12/05/21 1642 12/05/21 2124 12/06/21 0738  GLUCAP 184* 89 125* 140* 99   Iron Studies:  Recent Labs    12/04/21 0738  FERRITIN 1,431*   @lablastinr3 @ Studies/Results: PERIPHERAL VASCULAR CATHETERIZATION  Result Date: 12/05/2021 Images from the original result were not included.   Patient name: Courtney Gray  MRN: [id number]       DOB: [birth year]            Sex: female  12/04/2021 Pre-operative Diagnosis: End stage renal disease Post-operative diagnosis:  Same Surgeon:  Broadus John, MD Procedure Performed: 1.  Right upper extremity venogram 2.  Central venogram    Indications: Patient is a 51 year old female with end-stage renal disease requiring long-term HD access.  She has had multiple access procedures.  There is concern she may not have adequate axillary vein in the right upper extremity for AV graft placement.  After discussing the risks and benefits of right upper extremity venogram to define venous outflow,  Courtney Gray elected to proceed.  Findings: Right basilic vein widely patent. Paired brachial veins did not opacify likely due to the vein cannulated peripherally. The axillary vein was widely patent. The subclavian vein appears to have some level of stenosis from compression where the clavicle crossed the first rib.             Procedure:  Patient was brought to the Cath Lab and laid in the supine position.  A small amount of pain medication was administered due to bedsores.  Right upper extremity venogram followed from previously placed IV.   Courtney Santee, MD [facility] Office: [phone]    Medications:  ferric gluconate  (FERRLECIT) IVPB Stopped (12/02/21 1318)    aspirin EC  81 mg Oral Daily   atorvastatin  10 mg Oral Daily   Chlorhexidine Gluconate Cloth  6 each Topical Q0600   [START ON 12/09/2021] darbepoetin (ARANESP) injection - DIALYSIS  150 mcg Intravenous Q Mon-HD   feeding supplement  1 Container Oral TID BM   gabapentin  100 mg Oral Q1200   gabapentin  200 mg Oral QHS   heparin  5,000 Units Subcutaneous Q8H   insulin aspart  0-5 Units Subcutaneous QHS   insulin aspart  0-6 Units Subcutaneous TID WC   leptospermum manuka honey  1 Application Topical Daily   levothyroxine  100 mcg Oral Q0600   lubiprostone  8 mcg Oral Once per day on Sun Tue Thu Sat   metoprolol tartrate  12.5 mg Oral BID   midodrine  10 mg Oral Q M,W,F-HD   pantoprazole  40 mg Oral Daily   sevelamer carbonate  1.6 g Oral TID WC   simethicone  80 mg Oral TID AC & HS     Outpatient Dialysis Orders: MWF - [facility]  4hrs, BFR 400, DFR 600,  AVF, EDW 146kg, 3K/ 2Ca - Heparin 2500 units IV with HD - Hectorol 21mcg IV q HD, held as of 10/30/21   Assessment/Plan: ESRD: HD MWF. [place] did work well last two dialysis treatments.  2. TDC malfunction: Has required two TDC exchanges. IR thinks TDC is positional, did not have fibrin at last exchange. Discussed the need for a long term solution and patient does agree to a vascular surgery consult. Seen by [facility] possibly for permanent access next week if patient agrees.  3. Hypertension/volume: noted on metoprolol 12.5 mg twice daily now started on midodrine. DC metoprolol today. If need to resume will change to low dose carvedilol. Last hoyer wt 125 kg which is down significantly from OP EDW. Issues with hypotension but believe she does have extra volume on board. Extra short tx  4. Chronic respiratory failure with hypoxia on cannula oxygen at home: on 5L O2 at baseline 5. Anemia: Hgb 7.5. Aranesp dose increased. Iron replete, tsat 48% and ferritin borderline high. Stopped IV Fe  for now. Follow HGB and transfuse if needed.  6. Secondary hyperparathyroidism: corrected calcium has been high, vitamin D on hold. Phos elevated, on renvela, increasing dose 7. PAD: status post bilateral AKA for gangrene follow-up Dr. Stanford Breed, status post left middle finger amputation for gangrene 8. Disposition: Difficult situation recently: Patient required 6 staff members and 2 hoyer pads for transfer into HD chair as OP. Could not stay on dialysis for full treatment because of pain of multiple decubitus ulcers, PTAR unable to lift pt into ambulance for transport. SW working on Marine scientist HD  [employer]. Cristopher Ciccarelli NP-C 12/06/2021,  9:48 AM  [employer] [phone]

## 2021-12-06 NOTE — Progress Notes (Signed)
  Postoperative hemodialysis access     S/p RUE venogram on 12/04/2021 by Dr. Virl Cagey.    Subjective:  resting comfortably, no complaints.  Has not talked with her daughter yet  PHYSICAL EXAMINATION:  Vitals:   12/05/21 2047 12/06/21 0615  BP: (!) 147/51 (!) 104/59  Pulse: 66 75  Resp:  18  Temp: 97.8 F (36.6 C) 97.6 F (36.4 C)  SpO2: 100% 100%    General:  resting comfortably; no distress   ASSESSMENT/PLAN:  Courtney Gray is a 51 y.o. year old female who is s/p RUE venogram on 12/04/2021 by Dr. Virl Cagey.  .  -Dr. Donzetta Matters felt pt was candidate for RUE HD access.  Pt has not yet spoken to her daughter to determine timing.   Timing for access probably next week if pt is agreeable. -will check back with pt over the weekend to see if she has made any decisions about proceeding with access.  Please call Vascular surgery MD on call if you have any questions.   Leontine Locket, PA-C [facility] [phone]

## 2021-12-07 DIAGNOSIS — N186 End stage renal disease: Secondary | ICD-10-CM | POA: Diagnosis not present

## 2021-12-07 DIAGNOSIS — I739 Peripheral vascular disease, unspecified: Secondary | ICD-10-CM | POA: Diagnosis not present

## 2021-12-07 DIAGNOSIS — I10 Essential (primary) hypertension: Secondary | ICD-10-CM | POA: Diagnosis not present

## 2021-12-07 LAB — RENAL FUNCTION PANEL
Albumin: 1.6 g/dL — ABNORMAL LOW (ref 3.5–5.0)
Albumin: 2.2 g/dL — ABNORMAL LOW (ref 3.5–5.0)
Anion gap: 8 (ref 5–15)
Anion gap: 11 (ref 5–15)
BUN: 23 mg/dL — ABNORMAL HIGH (ref 6–20)
BUN: 14 mg/dL (ref 6–20)
CO2: 22 mmol/L (ref 22–32)
CO2: 26 mmol/L (ref 22–32)
Calcium: 6.9 mg/dL — ABNORMAL LOW (ref 8.9–10.3)
Calcium: 8.6 mg/dL — ABNORMAL LOW (ref 8.9–10.3)
Chloride: 106 mmol/L (ref 98–111)
Chloride: 98 mmol/L (ref 98–111)
Creatinine, Ser: 4.79 mg/dL — ABNORMAL HIGH (ref 0.44–1.00)
Creatinine, Ser: 3.6 mg/dL — ABNORMAL HIGH (ref 0.44–1.00)
GFR, Estimated: 10 mL/min — ABNORMAL LOW (ref >60)
GFR, Estimated: 15 mL/min — ABNORMAL LOW (ref >60)
Glucose, Bld: 139 mg/dL — ABNORMAL HIGH (ref 70–99)
Glucose, Bld: 98 mg/dL (ref 70–99)
Phosphorus: 4.9 mg/dL — ABNORMAL HIGH (ref 2.5–4.6)
Phosphorus: 3.5 mg/dL (ref 2.5–4.6)
Potassium: 2.5 mmol/L — CL (ref 3.5–5.1)
Potassium: 3.4 mmol/L — ABNORMAL LOW (ref 3.5–5.1)
Sodium: 136 mmol/L (ref 135–145)
Sodium: 135 mmol/L (ref 135–145)

## 2021-12-07 LAB — CBC WITH DIFFERENTIAL/PLATELET
Abs Immature Granulocytes: 0.18 10*3/uL — ABNORMAL HIGH (ref 0.00–0.07)
Basophils Absolute: 0.1 10*3/uL (ref 0.0–0.1)
Basophils Relative: 1 %
Eosinophils Absolute: 1.1 10*3/uL — ABNORMAL HIGH (ref 0.0–0.5)
Eosinophils Relative: 9 %
HCT: 23.2 % — ABNORMAL LOW (ref 36.0–46.0)
Hemoglobin: 7.3 g/dL — ABNORMAL LOW (ref 12.0–15.0)
Immature Granulocytes: 1 %
Lymphocytes Relative: 12 %
Lymphs Abs: 1.5 10*3/uL (ref 0.7–4.0)
MCH: 30.7 pg (ref 26.0–34.0)
MCHC: 31.5 g/dL (ref 30.0–36.0)
MCV: 97.5 fL (ref 80.0–100.0)
Monocytes Absolute: 1.6 10*3/uL — ABNORMAL HIGH (ref 0.1–1.0)
Monocytes Relative: 13 %
Neutro Abs: 8.2 10*3/uL — ABNORMAL HIGH (ref 1.7–7.7)
Neutrophils Relative %: 64 %
Platelets: 253 10*3/uL (ref 150–400)
RBC: 2.38 MIL/uL — ABNORMAL LOW (ref 3.87–5.11)
RDW: 15.4 % (ref 11.5–15.5)
WBC: 12.6 10*3/uL — ABNORMAL HIGH (ref 4.0–10.5)
nRBC: 0 % (ref 0.0–0.2)

## 2021-12-07 LAB — GLUCOSE, CAPILLARY
Glucose-Capillary: 115 mg/dL — ABNORMAL HIGH (ref 70–99)
Glucose-Capillary: 134 mg/dL — ABNORMAL HIGH (ref 70–99)
Glucose-Capillary: 145 mg/dL — ABNORMAL HIGH (ref 70–99)

## 2021-12-07 MED ORDER — HEPARIN SODIUM (PORCINE) 1000 UNIT/ML IJ SOLN
INTRAMUSCULAR | Status: AC
  Filled 2021-12-07: qty 4

## 2021-12-07 MED ORDER — POTASSIUM CHLORIDE CRYS ER 20 MEQ PO TBCR
20.0000 meq | EXTENDED_RELEASE_TABLET | Freq: Once | ORAL | Status: AC
  Administered 2021-12-07: 20 meq via ORAL
  Filled 2021-12-07: qty 1

## 2021-12-07 MED ORDER — HEPARIN SODIUM (PORCINE) 1000 UNIT/ML IJ SOLN
INTRAMUSCULAR | Status: AC
  Administered 2021-12-07: 1500 [IU]
  Filled 2021-12-07: qty 2

## 2021-12-07 MED ORDER — DOCUSATE SODIUM 100 MG PO CAPS
100.0000 mg | ORAL_CAPSULE | Freq: Two times a day (BID) | ORAL | Status: DC
  Administered 2021-12-08 – 2022-01-05 (×55): 100 mg via ORAL
  Filled 2021-12-07 (×58): qty 1

## 2021-12-07 MED ORDER — SIMETHICONE 80 MG PO CHEW
80.0000 mg | CHEWABLE_TABLET | Freq: Four times a day (QID) | ORAL | Status: DC | PRN
  Administered 2021-12-07 – 2022-05-15 (×229): 80 mg via ORAL
  Filled 2021-12-07 (×249): qty 1

## 2021-12-07 NOTE — Progress Notes (Signed)
[facility] Progress Note   Subjective:Seen in room. Short HD today. She looks much happier today.   Objective Vitals:   12/06/21 1721 12/06/21 2104 12/07/21 0517 12/07/21 0859  BP: (!) 129/48 (!) 121/51 (!) 85/71 (!) 145/71  Pulse: 70 72 83 85  Resp: 18 18 19 17   Temp: 98.3 F (36.8 C) 98.2 F (36.8 C) 98.2 F (36.8 C) 98.3 F (36.8 C)  TempSrc:    Oral  SpO2: 99% 98% 100% 100%  Weight:      Height:       Physical Exam General: Chronically ill appearing obese female in NAD Heart: HS distant. S1,S2 no M/R/G. SR on monitor.  Lungs: CTAB Anteriorly. No WOB.  Abdomen: Obese, NT, NABS Extremities:Bilateral BKA no stump edema Dialysis Access: LIJ TDC drsg intact.    Additional Objective Labs: Basic Metabolic Panel: Recent Labs  Lab 12/02/21 0801 12/04/21 0738 12/06/21 0855  NA 137 134* 135  K 4.2 4.0 3.4*  CL 98 91* 97*  CO2 22 23 25   GLUCOSE 188* 152* 177*  BUN 66* 50* 39*  CREATININE 9.89* 8.73* 7.59*  CALCIUM 8.8* 9.3 8.7*  PHOS 9.3* 8.8* 7.7*   Liver Function Tests: Recent Labs  Lab 12/02/21 0801 12/04/21 0738 12/06/21 0855  ALBUMIN 2.2* 2.2* 2.1*   No results for input(s): "LIPASE", "AMYLASE" in the last 168 hours. CBC: Recent Labs  Lab 12/02/21 1022 12/03/21 0430 12/04/21 0738 12/06/21 0855  WBC 14.9* 15.6* 15.6* 14.5*  NEUTROABS  --  10.5*  --   --   HGB 7.8* 8.5* 8.0* 7.5*  HCT 25.2* 27.4* 26.7* 25.2*  MCV 97.3 96.1 99.3 100.0  PLT 245 223 267 271   Blood Culture    Component Value Date/Time   SDES BLOOD LEFT ANTECUBITAL 10/16/2021 1534   SPECREQUEST  10/16/2021 1534    BOTTLES DRAWN AEROBIC AND ANAEROBIC Blood Culture results may not be optimal due to an inadequate volume of blood received in culture bottles   CULT  10/16/2021 1534    NO GROWTH 5 DAYS Performed at [facility] [address], [place], [place] [address]    REPTSTATUS 10/21/2021 FINAL 10/16/2021 1534    Cardiac Enzymes: No results for  input(s): "CKTOTAL", "CKMB", "CKMBINDEX", "TROPONINI" in the last 168 hours. CBG: Recent Labs  Lab 12/06/21 0738 12/06/21 1341 12/06/21 1719 12/06/21 2102 12/07/21 0714  GLUCAP 99 88 135* 96 115*   Iron Studies: No results for input(s): "IRON", "TIBC", "TRANSFERRIN", "FERRITIN" in the last 72 hours. @lablastinr3 @ Studies/Results: No results found. Medications:   aspirin EC  81 mg Oral Daily   atorvastatin  10 mg Oral Daily   Chlorhexidine Gluconate Cloth  6 each Topical Q0600   [START ON 12/09/2021] darbepoetin (ARANESP) injection - DIALYSIS  150 mcg Intravenous Q Mon-HD   docusate sodium  100 mg Oral BID   feeding supplement  1 Container Oral TID BM   gabapentin  100 mg Oral Q1200   gabapentin  200 mg Oral QHS   heparin  5,000 Units Subcutaneous Q8H   insulin aspart  0-5 Units Subcutaneous QHS   insulin aspart  0-6 Units Subcutaneous TID WC   leptospermum manuka honey  1 Application Topical Daily   levothyroxine  100 mcg Oral Q0600   lubiprostone  8 mcg Oral Once per day on Sun Tue Thu Sat   midodrine  10 mg Oral Q M,W,F-HD   pantoprazole  40 mg Oral Daily   sevelamer carbonate  1.6  g Oral TID WC     Outpatient Dialysis Orders: MWF - [place]  4hrs, BFR 400, DFR 600,  AVF, EDW 146kg, 3K/ 2Ca - Heparin 2500 units IV with HD - Hectorol 32mcg IV q HD, held as of 10/30/21   Assessment/Plan: ESRD: HD MWF. [place] did work well last two dialysis treatments.  2. TDC malfunction: Has required two TDC exchanges. IR thinks TDC is positional, did not have fibrin at last exchange. Discussed the need for a long term solution and patient does agree to a vascular surgery consult. Seen by VVS possibly for permanent access next week if patient agrees.  3. Hypertension/volume: noted on metoprolol 12.5 mg twice daily now started on midodrine. DC metoprolol today. If need to resume will change to low dose carvedilol. Last hoyer wt 125 kg which is down significantly from OP EDW.  Issues with hypotension but believe she does have extra volume on board. Extra short tx 12/07/2021.  4. Chronic respiratory failure with hypoxia on cannula oxygen at home: on 5L O2 at baseline 5. Anemia: Hgb 7.5. Aranesp dose increased. Iron replete, tsat 48% and ferritin borderline high. Stopped IV Fe for now. Follow HGB and transfuse if needed.  6. Secondary hyperparathyroidism: corrected calcium has been high, vitamin D on hold. Phos elevated, on renvela, increasing dose 7. PAD: status post bilateral AKA for gangrene follow-up Dr. Stanford Breed, status post left middle finger amputation for gangrene 8. Disposition: Difficult situation recently: Patient required 6 staff members and 2 hoyer pads for transfer into HD chair as OP. Could not stay on dialysis for full treatment because of pain of multiple decubitus ulcers, PTAR unable to lift pt into ambulance for transport. SW working on Marine scientist HD.  Ryenne Lynam H. Bentley Haralson NP-C 12/07/2021, 10:58 AM  [employer] [phone]

## 2021-12-07 NOTE — Progress Notes (Signed)
Patient ID: Courtney Gray, female   DOB: 06/02/1970, 51 y.o.   MRN: [id number]  Patient stated she wanted bath. Reminded patient she had a bath this morning. Patient became agitated with staff. NT informed patient she would need another staff member to assist with bath.Patient asked to be set up and she would bathe self.  Haydee Salter, RN

## 2021-12-07 NOTE — Progress Notes (Signed)
PROGRESS NOTE    Courtney Gray  YBW:[id number] DOB: 14-Aug-1970 DOA: 11/01/2021 PCP: Monico Blitz, MD   Brief Narrative: Courtney Gray is a 51 y.o. female with a history of ESRD on HD, chronic heart failure, PAD, bilateral AKA, diabetes mellitus, morbid obesity, pressure injury. Patient presented secondary to shortness of breath which was consistent with acute heart failure that responded to hemodialysis. Discharge is pending ability to obtain outpatient hemodialysis that can accommodate patient's mobility constraints.  Assessment and Plan:  Acute on chronic diastolic heart failure Present on admission with associated pulmonary edema. Treated with hemodialysis. Acute process resolved. -Continue fluid management with hemodialysis.  Chronic respiratory failure Stable.  Acute pulmonary edema Secondary to acute heart failure and managed with hemodialysis.  Hypertensive emergency BP of 181/81 mmHg with associated acute heart failure on admission. Started on Imdur and managed with hemodialysis. Resolved.  ESRD on HD Nephrology consulted and are managing hemodialysis. Vascular surgery on board for consideration of AV fistula vs graft. Plan for right upper arm.  Anemia of chronic kidney disease Hemoglobin stable.  Diabetes mellitus type 2 Well controlled on insulin. Hemoglobin A1C of 5.7%. 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 -Continue aspirin  Hypomagnesemia Supplementation given.  Leukocytosis Mild and stable. No obvious evidence of acute infection. Loculated left pleural effusion noted on CT scan on 8/9. Patient is afebrile. -Will consider possible repeat chest CT scan to ensure improvement of loculated pleural effusion if leukocytosis remains and no other source is identified  Irritable bowel syndrome with constipation -Continue Amitiza  History of septic shock  secondary to bacteroides bacteremia with liver and splenic abscesses Diagnosed on prior hospitalization. Patient completed antibiotic course this admission. Abdominal drain removed by IR on 8/9.  Morbid obesity Body mass index is 45.76 kg/m.  Constipation -Colace scheduled  Pressure injury Right buttocks/right hip present on admission. Left/lower flank, not present on admission. -Wound care recommendations (8/30): Pack right ischial wound with silver hydrofiber (Aquacel Ag+) Lawson # [phone] to wound base, fluff fill wound bed with DRY kerlix or gauze. Top with dry dressing. Change daily   DVT prophylaxis: heparin subq Code Status:   Code Status: Full Code Family Communication: None at bedside Disposition Plan: Discharge to SNF pending ability to receive outpatient HD. Barrier includes not being able to sit up for hemodialysis secondary to pressure injury wound   Consultants:  Nephrology Interventional radiology Infectious disease Vascular surgery  Procedures:  Exchange of left IJ tunneled HD cath (8/22) Right upper extremity venogram/Central venogram (8/30)  Antimicrobials: Vancomycin Cefepime Flagyl    Subjective: Patient states that simethicone is making her have diarrhea. She also states that she needs the simethicone to help with gas cramps.    Objective: BP (!) 145/71 (BP Location: Left Arm)   Pulse 85   Temp 98.3 F (36.8 C) (Oral)   Resp 17   Ht 5\' 5"  (1.651 m)   Wt 124.7 kg   SpO2 100%   BMI 45.76 kg/m   Examination:  General exam: Appears calm and comfortable Respiratory system: Respiratory effort normal. Gastrointestinal system: Abdomen is obese Central nervous system: Alert and oriented. Psychiatry: Judgement and insight appear normal. Mood & affect appropriate.    Data Reviewed: I have personally reviewed following labs and imaging studies  CBC Lab Results  Component Value Date   WBC 14.5 (H) 12/06/2021   RBC 2.52 (L) 12/06/2021   HGB  7.5 (L) 12/06/2021   HCT 25.2 (L) 12/06/2021   MCV 100.0 12/06/2021   MCH 29.8 12/06/2021   PLT 271 12/06/2021   MCHC 29.8 (L) 12/06/2021   RDW 15.5 12/06/2021   LYMPHSABS 1.5 12/03/2021   MONOABS 2.0 (H) 12/03/2021   EOSABS 1.1 (H) 12/03/2021   BASOSABS 0.1 11/64/3539     Last metabolic panel Lab Results  Component Value Date   NA 135 12/06/2021   K 3.4 (L) 12/06/2021   CL 97 (L) 12/06/2021   CO2 25 12/06/2021   BUN 39 (H) 12/06/2021   CREATININE 7.59 (H) 12/06/2021   GLUCOSE 177 (H) 12/06/2021   GFRNONAA 6 (L) 12/06/2021   GFRAA 6 (L) 11/29/2019   CALCIUM 8.7 (L) 12/06/2021   PHOS 7.7 (H) 12/06/2021   PROT 6.9 11/06/2021   ALBUMIN 2.1 (L) 12/06/2021   BILITOT 0.9 11/06/2021   ALKPHOS 62 11/06/2021   AST 28 11/06/2021   ALT 7 11/06/2021   ANIONGAP 13 12/06/2021    GFR: Estimated Creatinine Clearance: 11.6 mL/min (A) (by C-G formula based on SCr of 7.59 mg/dL (H)).  No results found for this or any previous visit (from the past 240 hour(s)).    Radiology Studies: No results found.    LOS: 36 days    Cordelia Poche, MD [facility] 12/07/2021, 1:36 PM   If 7PM-7AM, please contact night-coverage [email or web address]

## 2021-12-07 NOTE — Progress Notes (Signed)
Received patient in bed to unit.  Alert and oriented.  Informed consent signed and in chart.   Treatment initiated: 1545 Treatment completed:1841 Patient tolerated well.  Transported back to the room  Alert, without acute distress.  Hand-off given to patient's nurse.   Access used: catheter Access issues: none  Total UF removed: 1000 mls Medication(s) given: heparin bolus Post HD VS: see chart Post HD weight: approx 120.0 kgs   [facility]

## 2021-12-08 DIAGNOSIS — I739 Peripheral vascular disease, unspecified: Secondary | ICD-10-CM | POA: Diagnosis not present

## 2021-12-08 DIAGNOSIS — N186 End stage renal disease: Secondary | ICD-10-CM | POA: Diagnosis not present

## 2021-12-08 DIAGNOSIS — I10 Essential (primary) hypertension: Secondary | ICD-10-CM | POA: Diagnosis not present

## 2021-12-08 LAB — RENAL FUNCTION PANEL
Albumin: 2.2 g/dL — ABNORMAL LOW (ref 3.5–5.0)
Anion gap: 11 (ref 5–15)
BUN: 17 mg/dL (ref 6–20)
CO2: 26 mmol/L (ref 22–32)
Calcium: 8.8 mg/dL — ABNORMAL LOW (ref 8.9–10.3)
Chloride: 96 mmol/L — ABNORMAL LOW (ref 98–111)
Creatinine, Ser: 4.29 mg/dL — ABNORMAL HIGH (ref 0.44–1.00)
GFR, Estimated: 12 mL/min — ABNORMAL LOW (ref >60)
Glucose, Bld: 88 mg/dL (ref 70–99)
Phosphorus: 4.6 mg/dL (ref 2.5–4.6)
Potassium: 3.3 mmol/L — ABNORMAL LOW (ref 3.5–5.1)
Sodium: 133 mmol/L — ABNORMAL LOW (ref 135–145)

## 2021-12-08 LAB — GLUCOSE, CAPILLARY
Glucose-Capillary: 88 mg/dL (ref 70–99)
Glucose-Capillary: 107 mg/dL — ABNORMAL HIGH (ref 70–99)
Glucose-Capillary: 133 mg/dL — ABNORMAL HIGH (ref 70–99)
Glucose-Capillary: 144 mg/dL — ABNORMAL HIGH (ref 70–99)

## 2021-12-08 MED ORDER — DARBEPOETIN ALFA 200 MCG/0.4ML IJ SOSY
200.0000 ug | PREFILLED_SYRINGE | INTRAMUSCULAR | Status: DC
  Administered 2021-12-09 – 2021-12-31 (×4): 200 ug via INTRAVENOUS
  Filled 2021-12-08 (×9): qty 0.4

## 2021-12-08 MED ORDER — POTASSIUM CHLORIDE 20 MEQ PO PACK
40.0000 meq | PACK | Freq: Once | ORAL | Status: AC
Start: 2021-12-08 — End: 2021-12-08
  Administered 2021-12-08: 40 meq via ORAL
  Filled 2021-12-08: qty 2

## 2021-12-08 NOTE — Progress Notes (Signed)
[facility] Progress Note   Subjective: Extra treatment 12/07/2021 Net UF 2 Liters. Issues with hypokalemia. HD     Objective Vitals:   12/07/21 1841 12/07/21 1845 12/07/21 2123 12/08/21 0518  BP: (!) 139/48  126/62 (!) 141/48  Pulse: 73  87 81  Resp: (!) 21  19 18   Temp: 98.1 F (36.7 C)  98.4 F (36.9 C) 98.2 F (36.8 C)  TempSrc:      SpO2: 98%  95% 100%  Weight: 120 kg 120 kg    Height:       Physical Exam General: Chronically ill appearing obese female in NAD Heart: HS distant. S1,S2 no M/R/G. SR on monitor.  Lungs: CTAB Anteriorly. No WOB.  Abdomen: Obese, NT, NABS Extremities:Bilateral BKA no stump edema Dialysis Access: LIJ TDC drsg intact.       Additional Objective Labs: Basic Metabolic Panel: Recent Labs  Lab 12/07/21 1556 12/07/21 2039 12/08/21 0450  NA 136 135 133*  K 2.5* 3.4* 3.3*  CL 106 98 96*  CO2 22 26 26   GLUCOSE 139* 98 88  BUN 23* 14 17  CREATININE 4.79* 3.60* 4.29*  CALCIUM 6.9* 8.6* 8.8*  PHOS 4.9* 3.5 4.6   Liver Function Tests: Recent Labs  Lab 12/07/21 1556 12/07/21 2039 12/08/21 0450  ALBUMIN 1.6* 2.2* 2.2*   No results for input(s): "LIPASE", "AMYLASE" in the last 168 hours. CBC: Recent Labs  Lab 12/02/21 1022 12/03/21 0430 12/04/21 0738 12/06/21 0855 12/07/21 1555  WBC 14.9* 15.6* 15.6* 14.5* 12.6*  NEUTROABS  --  10.5*  --   --  8.2*  HGB 7.8* 8.5* 8.0* 7.5* 7.3*  HCT 25.2* 27.4* 26.7* 25.2* 23.2*  MCV 97.3 96.1 99.3 100.0 97.5  PLT 245 223 267 271 253   Blood Culture    Component Value Date/Time   SDES BLOOD LEFT ANTECUBITAL 10/16/2021 1534   SPECREQUEST  10/16/2021 1534    BOTTLES DRAWN AEROBIC AND ANAEROBIC Blood Culture results may not be optimal due to an inadequate volume of blood received in culture bottles   CULT  10/16/2021 1534    NO GROWTH 5 DAYS Performed at [facility], [place] [address], [place], [place] [address]    REPTSTATUS 10/21/2021 FINAL 10/16/2021 1534     Cardiac Enzymes: No results for input(s): "CKTOTAL", "CKMB", "CKMBINDEX", "TROPONINI" in the last 168 hours. CBG: Recent Labs  Lab 12/06/21 2102 12/07/21 0714 12/07/21 1121 12/07/21 2123 12/08/21 0726  GLUCAP 96 115* 134* 145* 88   Iron Studies: No results for input(s): "IRON", "TIBC", "TRANSFERRIN", "FERRITIN" in the last 72 hours. @lablastinr3 @ Studies/Results: No results found. Medications:   aspirin EC  81 mg Oral Daily   atorvastatin  10 mg Oral Daily   Chlorhexidine Gluconate Cloth  6 each Topical Q0600   [START ON 12/09/2021] darbepoetin (ARANESP) injection - DIALYSIS  150 mcg Intravenous Q Mon-HD   docusate sodium  100 mg Oral BID   feeding supplement  1 Container Oral TID BM   gabapentin  100 mg Oral Q1200   gabapentin  200 mg Oral QHS   heparin  5,000 Units Subcutaneous Q8H   insulin aspart  0-5 Units Subcutaneous QHS   insulin aspart  0-6 Units Subcutaneous TID WC   leptospermum manuka honey  1 Application Topical Daily   levothyroxine  100 mcg Oral Q0600   lubiprostone  8 mcg Oral Once per day on Sun Tue Thu Sat   midodrine  10 mg Oral Q M,W,F-HD  pantoprazole  40 mg Oral Daily   sevelamer carbonate  1.6 g Oral TID WC     Outpatient Dialysis Orders: MWF - [place]  4hrs, BFR 400, DFR 600,  AVF, EDW 146kg, 3K/ 2Ca - Heparin 2500 units IV with HD - Hectorol 37mcg IV q HD, held as of 10/30/21   Assessment/Plan: ESRD: HD MWF. [place] did work well last two dialysis treatments.  2. TDC malfunction: Has required two TDC exchanges. IR thinks TDC is positional, did not have fibrin at last exchange. Discussed the need for a long term solution and patient does agree to a vascular surgery consult. Seen by VVS possibly for permanent access next week if patient agrees.  3. Hypertension/volume: noted on metoprolol 12.5 mg twice daily now started on midodrine. DC metoprolol 12/06/2021. If need to resume will change to low dose carvedilol. Last hoyer wt 125 kg  which is down significantly from OP EDW. Issues with hypotension but believe she does have extra volume on board. Extra short tx 12/07/2021-net UF 2 liters. Volume improving. BP improved.  4. Chronic respiratory failure with hypoxia on cannula oxygen at home: on 5L O2 at baseline 5. Anemia: Hgb 7.3. Aranesp dose increased to max dose, due 12/09/2021. No overt blood loss noted.   Follow HGB and transfuse if needed.  6. Secondary hyperparathyroidism: corrected calcium has been high, vitamin D on hold. Phos elevated, on renvela, increasing dose 7. PAD: status post bilateral AKA for gangrene follow-up Dr. Stanford Breed, status post left middle finger amputation for gangrene 8. Hypokalemia: New issue. Mostly like D/T actual compliance with renal diet. Refusing K dur tabs. KCL 40 MEQ today. Liberalize diet.  9. Disposition: Difficult situation recently: Patient required 6 staff members and 2 hoyer pads for transfer into HD chair as OP. Could not stay on dialysis for full treatment because of pain of multiple decubitus ulcers, PTAR unable to lift pt into ambulance for transport. SW working on Marine scientist HD.  Hollynn Garno H. Janaria Mccammon NP-C 12/08/2021, 8:48 AM  [employer] [phone]

## 2021-12-08 NOTE — Progress Notes (Signed)
PROGRESS NOTE    Courtney Gray  DPO:[id number] DOB: 02-Nov-1970 DOA: 11/01/2021 PCP: Monico Blitz, MD   Brief Narrative: Courtney Gray is a 51 y.o. female with a history of ESRD on HD, chronic heart failure, PAD, bilateral AKA, diabetes mellitus, morbid obesity, pressure injury. Patient presented secondary to shortness of breath which was consistent with acute heart failure that responded to hemodialysis. Discharge is pending ability to obtain outpatient hemodialysis that can accommodate patient's mobility constraints.  Assessment and Plan:  Acute on chronic diastolic heart failure Present on admission with associated pulmonary edema. Treated with hemodialysis. Acute process resolved. -Continue fluid management with hemodialysis.  Chronic respiratory failure Stable.  Acute pulmonary edema Secondary to acute heart failure and managed with hemodialysis.  Hypertensive emergency BP of 181/81 mmHg with associated acute heart failure on admission. Started on Imdur and managed with hemodialysis. Resolved.  ESRD on HD Nephrology consulted and are managing hemodialysis. Vascular surgery on board for consideration of AV fistula vs graft. Plan for right upper arm.  Anemia of chronic kidney disease Hemoglobin stable.  Diabetes mellitus type 2 Well controlled on insulin. Hemoglobin A1C of 5.7%. 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 -Continue aspirin  Hypomagnesemia Supplementation given.  Leukocytosis Mild and stable. No obvious evidence of acute infection. Loculated left pleural effusion noted on CT scan on 8/9. Patient is afebrile. Improving.  Irritable bowel syndrome with constipation Patient with continued diarrhea. -Discontinue Amitiza   History of septic shock secondary to bacteroides bacteremia with liver and splenic abscesses Diagnosed on prior hospitalization.  Patient completed antibiotic course this admission. Abdominal drain removed by IR on 8/9.  Morbid obesity Body mass index is 44.02 kg/m.  Constipation -Colace scheduled  Pressure injury Right buttocks/right hip present on admission. Left/lower flank, not present on admission. -Wound care recommendations (8/30): Pack right ischial wound with silver hydrofiber (Aquacel Ag+) Lawson # [phone] to wound base, fluff fill wound bed with DRY kerlix or gauze. Top with dry dressing. Change daily   DVT prophylaxis: heparin subq Code Status:   Code Status: Full Code Family Communication: None at bedside Disposition Plan: Discharge to SNF pending ability to receive outpatient HD. Barrier includes not being able to sit up for hemodialysis secondary to pressure injury wound   Consultants:  Nephrology Interventional radiology Infectious disease Vascular surgery  Procedures:  Exchange of left IJ tunneled HD cath (8/22) Right upper extremity venogram/Central venogram (8/30)  Antimicrobials: Vancomycin Cefepime Flagyl    Subjective: Patient continues to have diarrhea. No other issues.  Objective: BP (!) 152/49 (BP Location: Left Arm)   Pulse 71   Temp 98.4 F (36.9 C) (Oral)   Resp 18   Ht 5\' 5"  (1.651 m)   Wt 120 kg   SpO2 100%   BMI 44.02 kg/m   Examination:  General exam: Appears calm and comfortable Respiratory system: Clear to auscultation. Respiratory effort normal. Cardiovascular system: S1 & S2 heard, RRR.  Gastrointestinal system: Abdomen is nondistended, soft and nontender. Normal bowel sounds heard. Central nervous system: Alert and oriented. No focal neurological deficits. Psychiatry: Judgement and insight appear normal. Mood & affect appropriate.    Data Reviewed: I have personally reviewed following labs and imaging studies  CBC Lab Results  Component Value Date   WBC 12.6 (H) 12/07/2021   RBC 2.38 (L) 12/07/2021   HGB 7.3 (L) 12/07/2021   HCT 23.2 (L)  12/07/2021  MCV 97.5 12/07/2021   MCH 30.7 12/07/2021   PLT 253 12/07/2021   MCHC 31.5 12/07/2021   RDW 15.4 12/07/2021   LYMPHSABS 1.5 12/07/2021   MONOABS 1.6 (H) 12/07/2021   EOSABS 1.1 (H) 12/07/2021   BASOSABS 0.1 97/74/1423     Last metabolic panel Lab Results  Component Value Date   NA 133 (L) 12/08/2021   K 3.3 (L) 12/08/2021   CL 96 (L) 12/08/2021   CO2 26 12/08/2021   BUN 17 12/08/2021   CREATININE 4.29 (H) 12/08/2021   GLUCOSE 88 12/08/2021   GFRNONAA 12 (L) 12/08/2021   GFRAA 6 (L) 11/29/2019   CALCIUM 8.8 (L) 12/08/2021   PHOS 4.6 12/08/2021   PROT 6.9 11/06/2021   ALBUMIN 2.2 (L) 12/08/2021   BILITOT 0.9 11/06/2021   ALKPHOS 62 11/06/2021   AST 28 11/06/2021   ALT 7 11/06/2021   ANIONGAP 11 12/08/2021    GFR: Estimated Creatinine Clearance: 20.1 mL/min (A) (by C-G formula based on SCr of 4.29 mg/dL (H)).  No results found for this or any previous visit (from the past 240 hour(s)).    Radiology Studies: No results found.    LOS: 37 days    Cordelia Poche, MD [facility] 12/08/2021, 3:34 PM   If 7PM-7AM, please contact night-coverage [email or web address]

## 2021-12-09 DIAGNOSIS — N186 End stage renal disease: Secondary | ICD-10-CM | POA: Diagnosis not present

## 2021-12-09 DIAGNOSIS — I10 Essential (primary) hypertension: Secondary | ICD-10-CM | POA: Diagnosis not present

## 2021-12-09 DIAGNOSIS — I739 Peripheral vascular disease, unspecified: Secondary | ICD-10-CM | POA: Diagnosis not present

## 2021-12-09 LAB — RENAL FUNCTION PANEL
Albumin: 2.1 g/dL — ABNORMAL LOW (ref 3.5–5.0)
Anion gap: 14 (ref 5–15)
BUN: 28 mg/dL — ABNORMAL HIGH (ref 6–20)
CO2: 24 mmol/L (ref 22–32)
Calcium: 9.3 mg/dL (ref 8.9–10.3)
Chloride: 97 mmol/L — ABNORMAL LOW (ref 98–111)
Creatinine, Ser: 6.36 mg/dL — ABNORMAL HIGH (ref 0.44–1.00)
GFR, Estimated: 7 mL/min — ABNORMAL LOW (ref >60)
Glucose, Bld: 187 mg/dL — ABNORMAL HIGH (ref 70–99)
Phosphorus: 6 mg/dL — ABNORMAL HIGH (ref 2.5–4.6)
Potassium: 3.9 mmol/L (ref 3.5–5.1)
Sodium: 135 mmol/L (ref 135–145)

## 2021-12-09 LAB — CBC
HCT: 26.8 % — ABNORMAL LOW (ref 36.0–46.0)
Hemoglobin: 8.4 g/dL — ABNORMAL LOW (ref 12.0–15.0)
MCH: 30 pg (ref 26.0–34.0)
MCHC: 31.3 g/dL (ref 30.0–36.0)
MCV: 95.7 fL (ref 80.0–100.0)
Platelets: 282 10*3/uL (ref 150–400)
RBC: 2.8 MIL/uL — ABNORMAL LOW (ref 3.87–5.11)
RDW: 15.1 % (ref 11.5–15.5)
WBC: 12.7 10*3/uL — ABNORMAL HIGH (ref 4.0–10.5)
nRBC: 0 % (ref 0.0–0.2)

## 2021-12-09 LAB — GLUCOSE, CAPILLARY
Glucose-Capillary: 94 mg/dL (ref 70–99)
Glucose-Capillary: 102 mg/dL — ABNORMAL HIGH (ref 70–99)
Glucose-Capillary: 128 mg/dL — ABNORMAL HIGH (ref 70–99)

## 2021-12-09 MED ORDER — ALTEPLASE 2 MG IJ SOLR
INTRAMUSCULAR | Status: AC
  Filled 2021-12-09: qty 4

## 2021-12-09 MED ORDER — LIDOCAINE-PRILOCAINE 2.5-2.5 % EX CREA: 1.0000 | TOPICAL_CREAM | CUTANEOUS | Status: DC | PRN

## 2021-12-09 MED ORDER — ALTEPLASE 2 MG IJ SOLR
2.0000 mg | Freq: Once | INTRAMUSCULAR | Status: AC | PRN
  Administered 2021-12-09: 4 mg
  Filled 2021-12-09 (×2): qty 2

## 2021-12-09 MED ORDER — CHLORHEXIDINE GLUCONATE CLOTH 2 % EX PADS
6.0000 | MEDICATED_PAD | Freq: Every day | CUTANEOUS | Status: DC
  Administered 2021-12-10 – 2021-12-11 (×2): 6 via TOPICAL

## 2021-12-09 MED ORDER — HEPARIN SODIUM (PORCINE) 1000 UNIT/ML DIALYSIS: 1000.0000 [IU] | INTRAMUSCULAR | Status: DC | PRN

## 2021-12-09 MED ORDER — LIDOCAINE HCL (PF) 1 % IJ SOLN: 5.0000 mL | INTRAMUSCULAR | Status: DC | PRN

## 2021-12-09 MED ORDER — SODIUM CHLORIDE 0.9 % IV SOLN: Freq: Once | INTRAVENOUS | Status: DC

## 2021-12-09 MED ORDER — ANTICOAGULANT SODIUM CITRATE 4% (200MG/5ML) IV SOLN: 5.0000 mL | Status: DC | PRN

## 2021-12-09 MED ORDER — ALTEPLASE 2 MG IJ SOLR
INTRAMUSCULAR | Status: AC
  Administered 2021-12-09: 4 mg
  Filled 2021-12-09: qty 2

## 2021-12-09 MED ORDER — HEPARIN SODIUM (PORCINE) 1000 UNIT/ML IJ SOLN
INTRAMUSCULAR | Status: AC
  Filled 2021-12-09: qty 5

## 2021-12-09 MED ORDER — PENTAFLUOROPROP-TETRAFLUOROETH EX AERO: 1.0000 | INHALATION_SPRAY | CUTANEOUS | Status: DC | PRN

## 2021-12-09 MED ORDER — HEPARIN SODIUM (PORCINE) 1000 UNIT/ML DIALYSIS
20.0000 [IU]/kg | INTRAMUSCULAR | Status: DC | PRN
  Administered 2021-12-09: 3000 [IU] via INTRAVENOUS_CENTRAL

## 2021-12-09 NOTE — Progress Notes (Signed)
PROGRESS NOTE    Courtney Gray  QBH:[id number] DOB: Oct 24, 1970 DOA: 11/01/2021 PCP: Monico Blitz, MD   Brief Narrative: Courtney Gray is a 51 y.o. female with a history of ESRD on HD, chronic heart failure, PAD, bilateral AKA, diabetes mellitus, morbid obesity, pressure injury. Patient presented secondary to shortness of breath which was consistent with acute heart failure that responded to hemodialysis. Discharge is pending ability to obtain outpatient hemodialysis that can accommodate patient's mobility constraints.  Assessment and Plan:  Acute on chronic diastolic heart failure Present on admission with associated pulmonary edema. Treated with hemodialysis. Acute process resolved. -Continue fluid management with hemodialysis.  Chronic respiratory failure Stable.  Acute pulmonary edema Secondary to acute heart failure and managed with hemodialysis.  Hypertensive emergency BP of 181/81 mmHg with associated acute heart failure on admission. Started on Imdur and managed with hemodialysis. Resolved.  ESRD on HD Nephrology consulted and are managing hemodialysis. Vascular surgery on board for consideration of AV fistula vs graft. Plan for right upper arm.  Anemia of chronic kidney disease Hemoglobin stable.  Diabetes mellitus type 2 Well controlled on insulin. Hemoglobin A1C of 5.7%. 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 -Continue aspirin  Hypomagnesemia Supplementation given.  Leukocytosis Mild and stable. No obvious evidence of acute infection. Loculated left pleural effusion noted on CT scan on 8/9. Patient is afebrile. Improving.  Irritable bowel syndrome with constipation Amitiza discontinued secondary to diarrhea.  History of septic shock secondary to bacteroides bacteremia with liver and splenic abscesses Diagnosed on prior hospitalization. Patient  completed antibiotic course this admission. Abdominal drain removed by IR on 8/9.  Morbid obesity Body mass index is 44.02 kg/m.  Constipation -Colace scheduled  Pressure injury Right buttocks/right hip present on admission. Left/lower flank, not present on admission. -Wound care recommendations (8/30): Pack right ischial wound with silver hydrofiber (Aquacel Ag+) Lawson # [phone] to wound base, fluff fill wound bed with DRY kerlix or gauze. Top with dry dressing. Change daily   DVT prophylaxis: heparin subq Code Status:   Code Status: Full Code Family Communication: None at bedside Disposition Plan: Discharge to SNF pending ability to receive outpatient HD. Barrier includes not being able to sit up for hemodialysis secondary to pressure injury wound   Consultants:  Nephrology Interventional radiology Infectious disease Vascular surgery  Procedures:  Exchange of left IJ tunneled HD cath (8/22) Right upper extremity venogram/Central venogram (8/30)  Antimicrobials: Vancomycin Cefepime Flagyl    Subjective: No issues noted overnight.  Objective: BP (!) 115/52   Pulse 74   Temp 98.6 F (37 C) (Oral)   Resp (!) 22   Ht 5\' 5"  (1.651 m)   Wt 120 kg   SpO2 100%   BMI 44.02 kg/m   Examination:  General exam: Appears calm and comfortable Respiratory system: Respiratory effort normal. Gastrointestinal system: Abdomen is non-distended    Data Reviewed: I have personally reviewed following labs and imaging studies  CBC Lab Results  Component Value Date   WBC 12.7 (H) 12/09/2021   RBC 2.80 (L) 12/09/2021   HGB 8.4 (L) 12/09/2021   HCT 26.8 (L) 12/09/2021   MCV 95.7 12/09/2021   MCH 30.0 12/09/2021   PLT 282 12/09/2021   MCHC 31.3 12/09/2021   RDW 15.1 12/09/2021   LYMPHSABS 1.5 12/07/2021   MONOABS 1.6 (H) 12/07/2021   EOSABS 1.1 (H) 12/07/2021   BASOSABS 0.1 12/07/2021  Last metabolic panel Lab Results  Component Value Date   NA 135 12/09/2021    K 3.9 12/09/2021   CL 97 (L) 12/09/2021   CO2 24 12/09/2021   BUN 28 (H) 12/09/2021   CREATININE 6.36 (H) 12/09/2021   GLUCOSE 187 (H) 12/09/2021   GFRNONAA 7 (L) 12/09/2021   GFRAA 6 (L) 11/29/2019   CALCIUM 9.3 12/09/2021   PHOS 6.0 (H) 12/09/2021   PROT 6.9 11/06/2021   ALBUMIN 2.1 (L) 12/09/2021   BILITOT 0.9 11/06/2021   ALKPHOS 62 11/06/2021   AST 28 11/06/2021   ALT 7 11/06/2021   ANIONGAP 14 12/09/2021    GFR: Estimated Creatinine Clearance: 13.6 mL/min (A) (by C-G formula based on SCr of 6.36 mg/dL (H)).  No results found for this or any previous visit (from the past 240 hour(s)).    Radiology Studies: No results found.    LOS: 74 days    Cordelia Poche, MD [facility] 12/09/2021, 1:23 PM   If 7PM-7AM, please contact night-coverage [email or web address]

## 2021-12-09 NOTE — Progress Notes (Addendum)
Received patient in bed to unit.  Alert and oriented.  Informed consent signed and in chart.   Treatment initiated: 6004, 1133 Treatment completed: 5997, 7414  Patient tolerated well. Changed HD set once for technical machine alarms and twice d/t elevated a/v/tmp pressures and clotted dialyzer despite ns flushes, dwelling alteplase, reversing a/v lines and given heparin in hd blood lines Transported back to the room  Alert, without acute distress.  Hand-off given to patient's nurse.   Access used: HD catheter left chest Access issues: yes. Elevated a/v/tmp pressures   Total UF removed: 0.1 liters  Medication(s) given: alteplase, aranesp, heparin 2400 units Post HD VS: 101/66 HR 80 RR 27 Temp oral 99.1 Post HD weight: uta d/t bed scale not working   [facility]

## 2021-12-09 NOTE — Plan of Care (Signed)
  Problem: Clinical Measurements: Goal: Cardiovascular complication will be avoided Outcome: Progressing   Problem: Nutrition: Goal: Adequate nutrition will be maintained Outcome: Progressing   Problem: Pain Managment: Goal: General experience of comfort will improve Outcome: Progressing   

## 2021-12-09 NOTE — Progress Notes (Addendum)
[facility] Progress Note   Subjective: Seen on HD. Smiling, looks amazing. No C/Os. Labs pending.    Objective Vitals:   12/08/21 1018 12/08/21 1751 12/08/21 2128 12/09/21 0514  BP: (!) 152/49 (!) 140/60 (!) 143/59 137/67  Pulse: 71 82 78 81  Resp: 18 18 18 17   Temp: 98.4 F (36.9 C) 98.7 F (37.1 C) 99.2 F (37.3 C) 98.5 F (36.9 C)  TempSrc: Oral Oral    SpO2: 100% 100% 97% 100%  Weight:      Height:       Physical Exam General: Chronically ill appearing obese female in NAD Heart: HS distant. S1,S2 no M/R/G. SR on monitor.  Lungs: CTAB Anteriorly. No WOB.  Abdomen: Obese, NT, NABS Extremities:Bilateral BKA no stump edema Dialysis Access: LIJ TDC drsg intact.     Additional Objective Labs: Basic Metabolic Panel: Recent Labs  Lab 12/07/21 1556 12/07/21 2039 12/08/21 0450  NA 136 135 133*  K 2.5* 3.4* 3.3*  CL 106 98 96*  CO2 22 26 26   GLUCOSE 139* 98 88  BUN 23* 14 17  CREATININE 4.79* 3.60* 4.29*  CALCIUM 6.9* 8.6* 8.8*  PHOS 4.9* 3.5 4.6   Liver Function Tests: Recent Labs  Lab 12/07/21 1556 12/07/21 2039 12/08/21 0450  ALBUMIN 1.6* 2.2* 2.2*   No results for input(s): "LIPASE", "AMYLASE" in the last 168 hours. CBC: Recent Labs  Lab 12/03/21 0430 12/04/21 0738 12/06/21 0855 12/07/21 1555 12/09/21 0254  WBC 15.6* 15.6* 14.5* 12.6* 12.7*  NEUTROABS 10.5*  --   --  8.2*  --   HGB 8.5* 8.0* 7.5* 7.3* 8.4*  HCT 27.4* 26.7* 25.2* 23.2* 26.8*  MCV 96.1 99.3 100.0 97.5 95.7  PLT 223 267 271 253 282   Blood Culture    Component Value Date/Time   SDES BLOOD LEFT ANTECUBITAL 10/16/2021 1534   SPECREQUEST  10/16/2021 1534    BOTTLES DRAWN AEROBIC AND ANAEROBIC Blood Culture results may not be optimal due to an inadequate volume of blood received in culture bottles   CULT  10/16/2021 1534    NO GROWTH 5 DAYS Performed at [facility], [place] [address], [place], [place] [address]    REPTSTATUS 10/21/2021 FINAL 10/16/2021 1534     Cardiac Enzymes: No results for input(s): "CKTOTAL", "CKMB", "CKMBINDEX", "TROPONINI" in the last 168 hours. CBG: Recent Labs  Lab 12/08/21 0726 12/08/21 1127 12/08/21 1749 12/08/21 2127 12/09/21 0731  GLUCAP 88 107* 133* 144* 94   Iron Studies: No results for input(s): "IRON", "TIBC", "TRANSFERRIN", "FERRITIN" in the last 72 hours. @lablastinr3 @ Studies/Results: No results found. Medications:  anticoagulant sodium citrate      aspirin EC  81 mg Oral Daily   atorvastatin  10 mg Oral Daily   Chlorhexidine Gluconate Cloth  6 each Topical Q0600   darbepoetin (ARANESP) injection - DIALYSIS  200 mcg Intravenous Q Mon-HD   docusate sodium  100 mg Oral BID   feeding supplement  1 Container Oral TID BM   gabapentin  100 mg Oral Q1200   gabapentin  200 mg Oral QHS   heparin  5,000 Units Subcutaneous Q8H   insulin aspart  0-5 Units Subcutaneous QHS   insulin aspart  0-6 Units Subcutaneous TID WC   leptospermum manuka honey  1 Application Topical Daily   levothyroxine  100 mcg Oral Q0600   midodrine  10 mg Oral Q M,W,F-HD   pantoprazole  40 mg Oral Daily   sevelamer carbonate  1.6 g Oral  TID WC     Outpatient Dialysis Orders: MWF - [facility]  4hrs, BFR 400, DFR 600,  AVF, EDW 146kg, 3K/ 2Ca - last hep B labs 8/21 - Heparin 2500 units IV with HD - Hectorol 54mcg IV q HD, held as of 10/30/21   Assessment/Plan: ESRD: HD MWF. [place] did work well last two dialysis treatments.  2. TDC malfunction: Has required two TDC exchanges. IR thinks TDC is positional, did not have fibrin at last exchange. Discussed the need for a long term solution and patient agreed to a vascular surgery consult.  3. Hypertension/volume: noted on metoprolol 12.5 mg twice daily now started on midodrine. DC metoprolol 12/06/2021. If need to resume will change to low dose carvedilol. Last hoyer wt 125 kg which is down significantly from OP EDW. Issues with hypotension but believe she does have extra  volume on board. Extra short tx 12/07/2021-net UF 2 liters. Volume improving. BP improved after stopping metoprolol Continue Midodrine.  4. Chronic respiratory failure with hypoxia on cannula oxygen at home: on 5L O2 at baseline 5. Anemia: Hgb 7.3. Aranesp dose increased to max dose, due 12/09/2021. No overt blood loss noted.   Follow HGB and transfuse if needed.  6. Secondary hyperparathyroidism: corrected calcium has been high, vitamin D on hold. Phos elevated, on renvela, increasing dose 7. PAD: status post bilateral AKA for gangrene follow-up Dr. Stanford Breed, status post left middle finger amputation for gangrene 8. Hypokalemia: New issue. Mostly like D/T actual compliance with renal diet. Refusing K dur tabs. KCL 40 MEQ today. Liberalize diet.  9. Disposition: Difficult situation recently: Patient required 6 staff members and 2 hoyer pads for transfer into HD chair as OP. Could not stay on dialysis for full treatment because of pain of multiple decubitus ulcers and PTAR unable to lift pt into ambulance for transport. SW working on Marine scientist HD.  Addendum: Also TDC did not work today (Monday, labor day) due to clotting, did not improve at all w/ TPA dwell. Will do overnight TPA and retry tomorrow. VVS consulted as well for access issues and need for permanent access.   Rita H. Brown NP-C 12/09/2021, 9:53 AM  [facility] [phone]  Pt seen, examined and agree w A/P as above.  Kelly Splinter  MD 12/09/2021, 3:24 PM

## 2021-12-10 ENCOUNTER — Inpatient Hospital Stay (HOSPITAL_COMMUNITY): Payer: 59

## 2021-12-10 DIAGNOSIS — I10 Essential (primary) hypertension: Secondary | ICD-10-CM | POA: Diagnosis not present

## 2021-12-10 DIAGNOSIS — I739 Peripheral vascular disease, unspecified: Secondary | ICD-10-CM | POA: Diagnosis not present

## 2021-12-10 DIAGNOSIS — N186 End stage renal disease: Secondary | ICD-10-CM | POA: Diagnosis not present

## 2021-12-10 LAB — RENAL FUNCTION PANEL
Albumin: 2.1 g/dL — ABNORMAL LOW (ref 3.5–5.0)
Anion gap: 11 (ref 5–15)
BUN: 16 mg/dL (ref 6–20)
CO2: 25 mmol/L (ref 22–32)
Calcium: 8.4 mg/dL — ABNORMAL LOW (ref 8.9–10.3)
Chloride: 98 mmol/L (ref 98–111)
Creatinine, Ser: 4.14 mg/dL — ABNORMAL HIGH (ref 0.44–1.00)
GFR, Estimated: 12 mL/min — ABNORMAL LOW (ref >60)
Glucose, Bld: 161 mg/dL — ABNORMAL HIGH (ref 70–99)
Phosphorus: 2.8 mg/dL (ref 2.5–4.6)
Potassium: 4.2 mmol/L (ref 3.5–5.1)
Sodium: 134 mmol/L — ABNORMAL LOW (ref 135–145)

## 2021-12-10 LAB — CBC
HCT: 24.9 % — ABNORMAL LOW (ref 36.0–46.0)
Hemoglobin: 7.8 g/dL — ABNORMAL LOW (ref 12.0–15.0)
MCH: 30.1 pg (ref 26.0–34.0)
MCHC: 31.3 g/dL (ref 30.0–36.0)
MCV: 96.1 fL (ref 80.0–100.0)
Platelets: 254 10*3/uL (ref 150–400)
RBC: 2.59 MIL/uL — ABNORMAL LOW (ref 3.87–5.11)
RDW: 15.5 % (ref 11.5–15.5)
WBC: 15.9 10*3/uL — ABNORMAL HIGH (ref 4.0–10.5)
nRBC: 0 % (ref 0.0–0.2)

## 2021-12-10 LAB — GLUCOSE, CAPILLARY
Glucose-Capillary: 101 mg/dL — ABNORMAL HIGH (ref 70–99)
Glucose-Capillary: 104 mg/dL — ABNORMAL HIGH (ref 70–99)

## 2021-12-10 MED ORDER — POLYETHYLENE GLYCOL 3350 17 G PO PACK: 17.0000 g | PACK | Freq: Every day | ORAL | Status: DC

## 2021-12-10 MED ORDER — HEPARIN SODIUM (PORCINE) 1000 UNIT/ML IJ SOLN
INTRAMUSCULAR | Status: AC
  Administered 2021-12-10: 2000 [IU]
  Filled 2021-12-10: qty 2

## 2021-12-10 MED ORDER — HEPARIN SODIUM (PORCINE) 1000 UNIT/ML DIALYSIS
5000.0000 [IU] | Freq: Once | INTRAMUSCULAR | Status: AC
  Administered 2021-12-10: 5000 [IU] via INTRAVENOUS_CENTRAL
  Filled 2021-12-10 (×2): qty 5

## 2021-12-10 MED ORDER — HEPARIN SODIUM (PORCINE) 1000 UNIT/ML IJ SOLN
3800.0000 [IU] | Freq: Once | INTRAMUSCULAR | Status: AC
  Administered 2021-12-10: 3800 [IU] via INTRAVENOUS

## 2021-12-10 MED ORDER — MIDODRINE HCL 5 MG PO TABS: 10.0000 mg | ORAL_TABLET | Freq: Once | ORAL | Status: AC | PRN

## 2021-12-10 MED ORDER — HEPARIN SODIUM (PORCINE) 1000 UNIT/ML DIALYSIS
2000.0000 [IU] | INTRAMUSCULAR | Status: DC | PRN
Start: 2021-12-10 — End: 2021-12-10
  Filled 2021-12-10 (×2): qty 2

## 2021-12-10 NOTE — Progress Notes (Signed)
Physical Therapy Treatment Patient Details Name: Courtney Gray MRN: [id number] DOB: [birth year] Today's Date: 12/10/2021   History of Present Illness Pt adm 7/28 with acute pulmonary edema. 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    PT Comments    Patient again hesitant to lift to wheelchair due to ischial wound and pain at site. Goal update due this date and wheelchair goals put on hold for now. Did work on bed mobility and supine to sit, plus LE exercises.    Recommendations for follow up therapy are one component of a multi-disciplinary discharge planning process, led by the attending physician.  Recommendations may be updated based on patient status, additional functional criteria and insurance authorization.  Follow Up Recommendations  Acute inpatient rehab (3hours/day) Can patient physically be transported by private vehicle: No   Assistance Recommended at Discharge Frequent or constant Supervision/Assistance  Patient can return home with the following Two people to help with walking and/or transfers;Two people to help with bathing/dressing/bathroom;Assist for transportation;Assistance with cooking/housework;Help with stairs or ramp for entrance   Equipment Recommendations  Wheelchair (measurements PT);Wheelchair cushion (measurements PT);Other (comment);Hospital bed    Recommendations for Other Services Rehab consult     Precautions / Restrictions Precautions Precautions: Fall Precaution Comments: bil AKA, 3L O2 at baseline, LUQ drain Restrictions Weight Bearing Restrictions: Yes RLE Weight Bearing: Non weight bearing LLE Weight Bearing: Non weight bearing Other Position/Activity Restrictions: minimized pushing/pulling and weight through L hand, dressing now removed     Mobility  Bed Mobility Overal bed mobility: Needs Assistance Bed Mobility: Rolling Rolling: Min assist    Supine to sit: +2 for physical assistance, Min assist, Min guard, HOB elevated Sit to supine: Supervision   General bed mobility comments: rolling to each side x2 for placing/removing lift pad; from [name] elevated enough for her to reach rails, did 3 sit-ups to full sitting; lowered HOB incrementally each time and pt progressed from min assist to minguard assist; coordinating breathing with exercise    Transfers Overall transfer level: Needs assistance Equipment used:  (maximove) Transfers:  (up in air to weigh)             General transfer comment: lifted with Maximove to weigh pt (per pt's request--needing weight for dialysis and bed scale not working) Transfer via [name]: [employer]  Ambulation/Gait               General Gait Details: "butt-walked" posteriorly x 4 steps toward HOB; vc for anterior wt-shift and pt with incr success/ease   [name], [name] (indicate cue type and reason): did not want to be lifted to wheelchair due to pain in rt ischial wound  Modified Rankin (Stroke Patients Only)       Balance Overall balance assessment: Needs assistance Sitting-balance support: No upper extremity supported Sitting balance-[name] Scale: Fair Sitting balance - Comments: long-sitting in bed                                    Cognition Arousal/Alertness: Awake/alert Behavior During Therapy: WFL for tasks assessed/performed Overall Cognitive Status: Within Functional Limits for tasks assessed  Exercises General Exercises - Lower Extremity Hip ABduction/ADduction: Supine, Both, 20 reps Straight Leg Raises: Supine, Both, 20 reps Other Exercises Other Exercises: Assisted sit ups from bed adjusting bed heights with emphasis on activating core, breathing and slow descent    General Comments         Pertinent Vitals/Pain Pain Assessment Pain Assessment: Faces Faces Pain Scale: Hurts little more Pain Location: R-ischial wound Pain Descriptors / Indicators: Discomfort, Guarding, Grimacing, Moaning, Sore, Sharp, Throbbing Pain Intervention(s): Limited activity within patient's tolerance, Monitored during session    Home Living                          Prior Function            PT Goals (current goals can now be found in the care plan section) Acute Rehab PT Goals Patient Stated Goal: ultimately wants to be modified independent at w/c level PT Goal Formulation: With patient Time For Goal Achievement: 12/24/21 Potential to Achieve Goals: Fair Progress towards PT goals: Progressing toward goals (goal update done based on timeframe)    Frequency    Min 3X/week      PT Plan Current plan remains appropriate    Co-evaluation              AM-PAC PT "6 Clicks" Mobility   Outcome Measure  Help needed turning from your back to your side while in a flat bed without using bedrails?: A Little Help needed moving from lying on your back to sitting on the side of a flat bed without using bedrails?: A Lot Help needed moving to and from a bed to a chair (including a wheelchair)?: Total Help needed standing up from a chair using your arms (e.g., wheelchair or bedside chair)?: Total Help needed to walk in hospital room?: Total Help needed climbing 3-5 steps with a railing? : Total 6 Click Score: 9    End of Session Equipment Utilized During Treatment: Oxygen Activity Tolerance: Patient tolerated treatment well Patient left: in bed;with call bell/phone within reach Nurse Communication: Other (comment) (wound dressing leaking and pt wants it changed; weight 275.5 lbs) PT Visit Diagnosis: Other abnormalities of gait and mobility (R26.89);Muscle weakness (generalized) (M62.81) Pain - Right/Left: Right Pain - part of body: Leg     Time: 6720-9470 PT Time  Calculation (min) (ACUTE ONLY): 34 min  Charges:  $Therapeutic Exercise: 8-22 mins $Therapeutic Activity: 8-22 mins                      Arby Barrette, PT Acute Rehabilitation Services  Office [phone]    [name] 12/10/2021, 12:08 PM

## 2021-12-10 NOTE — Progress Notes (Signed)
[facility] Progress Note   Subjective:  Seen in room. Was called this AM for repeat dialysis today - had alarming and dialysis catheter flow issues ended up only getting ~1.5hr dialysis in total with 0.1L off. tPA dwelled in cath for ~30 minutes without much improvement. We ended up locking her catheter with tPA yesterday for overnight dwell. She refused to come for treatment today, says that doesn't like back to back treatments and she has therapy to do. Explained that despite her being in the HD unit for some time yesterday, her treatment was minimal and we need to see if her catheter will function today because she will need an exchange if not. She understands. Offered to try again later tonight and she reluctantly agreed.   Objective Vitals:   12/09/21 1700 12/09/21 1951 12/10/21 0621 12/10/21 0622  BP: (!) 144/55 (!) 136/50 (!) 135/51 (!) 138/52  Pulse: 86 86 83 85  Resp:  18 20 18   Temp:  98.8 F (37.1 C) 98.7 F (37.1 C) 98.7 F (37.1 C)  TempSrc:  Oral    SpO2:  100% 100% 100%  Weight:      Height:       Physical Exam General: Chronically ill appearing, obese woman, NAD. Nasal O2 in place. Heart: RRR; no murmur Lungs: CTA anteriorly Abdomen: soft Extremities: B AKA, no stump edema Dialysis Access: [facility]  Additional Objective Labs: Basic Metabolic Panel: Recent Labs  Lab 12/07/21 2039 12/08/21 0450 12/09/21 0950  NA 135 133* 135  K 3.4* 3.3* 3.9  CL 98 96* 97*  CO2 26 26 24   GLUCOSE 98 88 187*  BUN 14 17 28*  CREATININE 3.60* 4.29* 6.36*  CALCIUM 8.6* 8.8* 9.3  PHOS 3.5 4.6 6.0*   Liver Function Tests: Recent Labs  Lab 12/07/21 2039 12/08/21 0450 12/09/21 0950  ALBUMIN 2.2* 2.2* 2.1*   CBC: Recent Labs  Lab 12/04/21 0738 12/06/21 0855 12/07/21 1555 12/09/21 0254  WBC 15.6* 14.5* 12.6* 12.7*  NEUTROABS  --   --  8.2*  --   HGB 8.0* 7.5* 7.3* 8.4*  HCT 26.7* 25.2* 23.2* 26.8*  MCV 99.3 100.0 97.5 95.7  PLT 267 271 253 282    Studies/Results: DG CHEST PORT 1 VIEW  Result Date: 12/10/2021 CLINICAL DATA:  [id number].  Vascular access problem. EXAM: PORTABLE CHEST 1 VIEW COMPARISON:  Portable chest 11/01/2021 FINDINGS: 5:13 a.m. Left IJ dialysis catheter again terminates in the distal SVC. Stable moderate cardiomegaly. There is no overt pulmonary edema. Small to moderate sized left pleural effusion partially tracks laterally and is smaller than previously. There is patchy opacity of the adjacent left lower lung field which could be atelectasis or pneumonia. The remaining lungs are clear. There is aortic tortuosity and calcification with stable mediastinum. There is no pneumothorax.  No acute osseous abnormality. IMPRESSION: Cardiomegaly without CHF findings. Small to moderate left pleural effusion is smaller than previously with adjacent atelectasis or consolidation left lower lung field. Perihilar vascular congestion and mild central edema noted previously are not seen today. Electronically Signed   By: Telford Nab M.D.   On: 12/10/2021 07:26    Medications:   aspirin EC  81 mg Oral Daily   atorvastatin  10 mg Oral Daily   Chlorhexidine Gluconate Cloth  6 each Topical Q0600   darbepoetin (ARANESP) injection - DIALYSIS  200 mcg Intravenous Q Mon-HD   docusate sodium  100 mg Oral BID   feeding supplement  1 Container Oral TID BM  gabapentin  100 mg Oral Q1200   gabapentin  200 mg Oral QHS   heparin  5,000 Units Subcutaneous Q8H   insulin aspart  0-5 Units Subcutaneous QHS   insulin aspart  0-6 Units Subcutaneous TID WC   leptospermum manuka honey  1 Application Topical Daily   levothyroxine  100 mcg Oral Q0600   midodrine  10 mg Oral Q M,W,F-HD   pantoprazole  40 mg Oral Daily   sevelamer carbonate  1.6 g Oral TID WC    Dialysis Orders: MWF - [facility]  4hrs, BFR 400, DFR 600,  AVF, EDW 146kg, 3K/ 2Ca - last hep B labs 8/21 - Heparin 2500 units IV with HD - Hectorol 31mcg IV q HD, held as of  10/30/21   Assessment/Plan: ESRD: Usual MWF schedule - poor HD on 9/4 due to recurrent TDC issues, will bring her back later today to assure working and for full treatment. TDC malfunction: Has required two [facility] exchanges and having issues again on 9/4, currently with tPA dwell. IR thinks TDC is positional, did not have fibrin at last exchange. Discussed the need for a long term solution, VVS consulted - plan is for RUE AVF next week. Hypertension/volume: BP variable, was on low dose metoprolol and midodine -> metop discontinued on 9/1. Last hoyer weight was 125kg which is down significantly from OP EDW -> lower on discharge. Chronic respiratory failure with hypoxia on cannula oxygen at home: on 5L O2 at baseline. Anemia of ESRD: Hgb 8.4 - continue max dose Aranesp weekly while here.  Secondary hyperparathyroidism: CorrCa high, VDRA on hold. Phos elevated, on renvela, ^ dose. 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 Hypokalemia: Recurrent issue recently and dislikes Klor-Con tabs. Using high K HD bath. Disposition: Difficult situation. Prior to admit, patient required 6 staff members and 2 hoyer pads for transfer into HD chair as OP. Could not stay on dialysis for full treatment because of pain of multiple decubitus ulcers and PTAR unable to lift pt into ambulance for transport. SW unable to locate stretcher dialysis location. Her weight is lower from admit -> will need to attempt hoyering to recliner to see if manageable. Continues to need SNF on discharge.  [name], PA-C 12/10/2021, 10:04 AM  [employer]

## 2021-12-10 NOTE — Progress Notes (Addendum)
  Progress Note    12/10/2021 7:22 AM 6 Days Post-Op  Subjective:  resting comfortably; wakes easily  afebrile  Vitals:   12/10/21 0621 12/10/21 0622  BP: (!) 135/51 (!) 138/52  Pulse: 83 85  Resp: 20 18  Temp: 98.7 F (37.1 C) 98.7 F (37.1 C)  SpO2: 100% 100%    Physical Exam: General:  no distress  CBC    Component Value Date/Time   WBC 12.7 (H) 12/09/2021 0254   RBC 2.80 (L) 12/09/2021 0254   HGB 8.4 (L) 12/09/2021 0254   HCT 26.8 (L) 12/09/2021 0254   PLT 282 12/09/2021 0254   MCV 95.7 12/09/2021 0254   MCH 30.0 12/09/2021 0254   MCHC 31.3 12/09/2021 0254   RDW 15.1 12/09/2021 0254   LYMPHSABS 1.5 12/07/2021 1555   MONOABS 1.6 (H) 12/07/2021 1555   EOSABS 1.1 (H) 12/07/2021 1555   BASOSABS 0.1 12/07/2021 1555    BMET    Component Value Date/Time   NA 135 12/09/2021 0950   K 3.9 12/09/2021 0950   CL 97 (L) 12/09/2021 0950   CO2 24 12/09/2021 0950   GLUCOSE 187 (H) 12/09/2021 0950   BUN 28 (H) 12/09/2021 0950   CREATININE 6.36 (H) 12/09/2021 0950   CALCIUM 9.3 12/09/2021 0950   GFRNONAA 7 (L) 12/09/2021 0950   GFRAA 6 (L) 11/29/2019 0120    INR    Component Value Date/Time   INR 1.3 (H) 10/09/2021 1219     Intake/Output Summary (Last 24 hours) at 12/10/2021 9244 Last data filed at 12/09/2021 1900 Gross per 24 hour  Intake 477 ml  Output -299.9 ml  Net 776.9 ml     Assessment/Plan:  51 y.o. female is s/p:  venogram  6 Days Post-Op   -Dr. Donzetta Matters felt pt was candidate for right arm access.  She has spoken with her daughter.  Pt wants her daughter to be here for surgery and her daughter's next day off is next Thursday 9/14.   -TDC non functioning yesterday-placed by IR.  Given she will not be going to OR until possibly next week, may need to consult IR for catheter exchange if overnight TPA not successful.      Leontine Locket, PA-C [facility] [phone] 12/10/2021 7:22 AM   I have independently interviewed and  examined patient and agree with PA assessment and plan above. Plan for right arm av access next Thursday unless she changes her mind. Needs hand surgery eval for non-healing left middle finger amputation.  Deasiah Hagberg C. Donzetta Matters, MD [facility] [place] Office: [phone] Pager: [phone]

## 2021-12-10 NOTE — Progress Notes (Signed)
Hemodialysis- Attempted to get patient for dialysis. Patient states does not want to come today due to coming yesterday. Explained to patient importance of seeing if overnight TPA dwell helped cath function. Patient states she just woke up and would like to get ready and eat breakfast. Notified we will attempt to get her later this am if schedule allows.

## 2021-12-10 NOTE — TOC Progression Note (Addendum)
Transition of Care [facility]) - Initial/Assessment Note    Patient Details  Name: Courtney Gray MRN: [id number] Date of Birth: [birth year]  [facility]) CM/SW Contact:    Milinda Antis, [place] Phone Number: 12/10/2021, 11:52 AM  Clinical Narrative:                 CSW searching for dialysis clinic that can accept patient as stretcher dialysis.    TOC will continue to follow.   Expected Discharge Plan: Long Term Nursing Home Barriers to Discharge: No SNF bed, Transportation, Waiting for outpatient dialysis   Patient Goals and CMS Choice Patient states their goals for this hospitalization and ongoing recovery are:: To be at a facility close to her daughter      Expected Discharge Plan and Services Expected Discharge Plan: [place]       Living arrangements for the past 2 months: Single Family Home                                      Prior Living Arrangements/Services Living arrangements for the past 2 months: Single Family Home Lives with:: Adult Children Patient language and need for interpreter reviewed:: Yes        Need for Family Participation in Patient Care: Yes (Comment) Care giver support system in place?: No (comment)   Criminal Activity/Legal Involvement Pertinent to Current Situation/Hospitalization: No - Comment as needed  Activities of Daily Living Home Assistive Devices/Equipment: [employer] fast streamer ADL Screening (condition at time of admission) Patient's cognitive ability adequate to safely complete daily activities?: No Is the patient deaf or have difficulty hearing?: No Does the patient have difficulty seeing, even when wearing glasses/contacts?: No Does the patient have difficulty concentrating, remembering, or making decisions?: No Patient able to express need for assistance with ADLs?: Yes Does the patient have difficulty dressing or bathing?: Yes Independently performs ADLs?: No Does the patient have difficulty walking or  climbing stairs?: Yes Weakness of Legs: Both Weakness of Arms/Hands: Both  Permission Sought/Granted Permission sought to share information with : Other (comment) Permission granted to share information with : Yes, Verbal Permission Granted  Share Information with NAME: Roebuck,antoinette (Daughter)   [phone]  Permission granted to share info w AGENCY: SNF        Emotional Assessment Appearance:: Appears older than stated age Attitude/Demeanor/Rapport: Engaged Affect (typically observed): Tearful/Crying, Accepting Orientation: : Oriented to Situation, Oriented to  Time, Oriented to Place, Oriented to Self Alcohol / Substance Use: Not Applicable Psych Involvement: No (comment)  Admission diagnosis:  End stage renal disease ([place]) [N18.6] Fluid overload [E87.70] Obesity with serious comorbidity, unspecified classification, unspecified obesity type [E66.9] Pressure injury of right buttock, stage 2 (HCC) [L89.312] Patient Active Problem List   Diagnosis Date Noted   Hypertension    Type 2 diabetes mellitus with hyperlipidemia ([place])    Decubitus ulcer of sacral region, stage 2 ([place])    Class 3 obesity ([place])    Bacteremia    PVD (peripheral vascular disease) ([place])    Hepatic abscess    Pressure injury of skin 09/13/2021   Hx of AKA (above knee amputation), right ([place])    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   Lactic acidosis 09/10/2021   Mixed diabetic hyperlipidemia associated with type 2 diabetes mellitus ([place]) 09/10/2021   Coronary artery disease involving native coronary artery of native heart without angina pectoris 09/10/2021   Elevated troponin  level not due myocardial infarction 09/10/2021   Hypotension 09/10/2021   Dry gangrene (HCC)    Pain 08/03/2021   Ischemic foot pain at rest 08/03/2021   Hypoglycemia 07/10/2021   Volume overload 07/09/2021   Leukocytosis 06/11/2021   Acute gout 06/11/2021   Hyperkalemia 05/21/2021   Hemorrhoids    Hyperlipidemia 05/18/2021    Rectal bleeding 05/17/2021   Morbid obesity with BMI of 60.0-69.9, adult ([place]) 05/17/2021   NSTEMI (non-ST elevated myocardial infarction) ([place]) 05/16/2021   Essential hypertension 09/01/2020   End stage renal disease ([place]) 09/01/2020   Chronic respiratory failure with hypoxia ([place]) 09/01/2020   Hypertensive urgency 11/28/2019   Uncontrolled type 2 diabetes mellitus with hypoglycemia, with long-term current use of insulin ([place]) 11/28/2019   Anemia of chronic disease 11/28/2019   PCP:  Monico Blitz, MD Pharmacy:   [place] [place] [address] [place] [address] Phone: [phone] Fax: [phone]  [facility] [address] [address] Phone: [phone] Fax: [phone]  [place], [place] - [address] [place] [address] Phone: [phone] Fax: [phone]     Social Determinants of Health ([place]) Interventions    Readmission Risk Interventions    09/16/2021    4:36 PM 08/28/2021   11:50 AM 07/12/2021   12:38 PM  Readmission Risk Prevention Plan  Transportation Screening Complete Complete Complete  Medication Review (RN Care Manager)  Complete Complete  PCP or Specialist appointment within 3-5 days of discharge  Complete Complete  HRI or Home Care Consult Complete Complete Complete  SW Recovery Care/Counseling Consult Complete Complete Complete  Palliative Care Screening  Not Applicable Not Applicable  Skilled Nursing Facility Complete Not Applicable Complete

## 2021-12-10 NOTE — Progress Notes (Signed)
Hemodialysis- Attempted to get patient again for dialysis. Patient states she just had therapy and is tired and unable to attend at the moment. Will attempt later if patient agrees.

## 2021-12-10 NOTE — Progress Notes (Signed)
PROGRESS NOTE    Courtney Gray  HGD:[id number] DOB: 07-26-1970 DOA: 11/01/2021 PCP: Monico Blitz, MD   Brief Narrative: Courtney Gray is a 51 y.o. female with a history of ESRD on HD, chronic heart failure, PAD, bilateral AKA, diabetes mellitus, morbid obesity, pressure injury. Patient presented secondary to shortness of breath which was consistent with acute heart failure that responded to hemodialysis. Discharge is pending ability to obtain outpatient hemodialysis that can accommodate patient's mobility constraints secondary to decubitus ulcer wound.  Assessment and Plan:  Acute on chronic diastolic heart failure Present on admission with associated pulmonary edema. Treated with hemodialysis. Acute process resolved. -Continue fluid management with hemodialysis.  Chronic respiratory failure Stable.  Acute pulmonary edema Secondary to acute heart failure and managed with hemodialysis.  Hypertensive emergency BP of 181/81 mmHg with associated acute heart failure on admission. Started on Imdur and managed with hemodialysis. Resolved.  ESRD on HD Nephrology consulted and are managing hemodialysis. Vascular surgery on board for consideration of AV fistula vs graft. Plan for right upper arm.  Anemia of chronic kidney disease Hemoglobin stable.  Diabetes mellitus type 2 Well controlled on insulin. Hemoglobin A1C of 5.7%. 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 -Continue aspirin  Hypomagnesemia Supplementation given.  Leukocytosis Mild and stable. No obvious evidence of acute infection. Loculated left pleural effusion noted on CT scan on 8/9. Patient is afebrile. Improving.  Irritable bowel syndrome with constipation Amitiza discontinued secondary to diarrhea. Now patient is back to having decreased bowel movements. Discussed today about considering Amitiza every other  day. Decision made to hold off on possibly starting this until tomorrow. Patient declined addition of MiraLAX to her regimen.  History of septic shock secondary to bacteroides bacteremia with liver and splenic abscesses Diagnosed on prior hospitalization. Patient completed antibiotic course this admission. Abdominal drain removed by IR on 8/9.  Morbid obesity Body mass index is 44.02 kg/m.  Constipation -Colace scheduled  Pressure injury Right buttocks/right hip present on admission. Left/lower flank, not present on admission. -Wound care recommendations (8/30): Pack right ischial wound with silver hydrofiber (Aquacel Ag+) Lawson # [phone] to wound base, fluff fill wound bed with DRY kerlix or gauze. Top with dry dressing. Change daily   DVT prophylaxis: heparin subq Code Status:   Code Status: Full Code Family Communication: None at bedside Disposition Plan: Discharge to SNF pending ability to receive outpatient HD. Barrier includes not being able to sit up for hemodialysis secondary to pressure injury wound   Consultants:  Nephrology Interventional radiology Infectious disease Vascular surgery  Procedures:  Exchange of left IJ tunneled HD cath (8/22) Right upper extremity venogram/Central venogram (8/30)  Antimicrobials: Vancomycin Cefepime Flagyl    Subjective: No bowel movement. Not happy about needing to go back to HD today; issues with hypotension during HD and plan was for therapy today.  Objective: BP (!) 138/52   Pulse 85   Temp 98.7 F (37.1 C)   Resp 18   Ht 5\' 5"  (1.651 m)   Wt 120 kg   SpO2 100%   BMI 44.02 kg/m   Examination:  General exam: Appears calm and comfortable Respiratory system: Clear to auscultation. Respiratory effort normal. Cardiovascular system: S1 & S2 heard, RRR. No murmurs, rubs, gallops or clicks. Gastrointestinal system: Abdomen is obese but non-distended, soft and nontender. Normal bowel sounds heard. Central nervous  system: Alert and oriented. No focal neurological deficits. Musculoskeletal:  Bilateral AKA Skin: No cyanosis. No rashes Psychiatry: Judgement and insight appear normal. Mood & affect appropriate.    Data Reviewed: I have personally reviewed following labs and imaging studies  CBC Lab Results  Component Value Date   WBC 12.7 (H) 12/09/2021   RBC 2.80 (L) 12/09/2021   HGB 8.4 (L) 12/09/2021   HCT 26.8 (L) 12/09/2021   MCV 95.7 12/09/2021   MCH 30.0 12/09/2021   PLT 282 12/09/2021   MCHC 31.3 12/09/2021   RDW 15.1 12/09/2021   LYMPHSABS 1.5 12/07/2021   MONOABS 1.6 (H) 12/07/2021   EOSABS 1.1 (H) 12/07/2021   BASOSABS 0.1 16/94/5038     Last metabolic panel Lab Results  Component Value Date   NA 135 12/09/2021   K 3.9 12/09/2021   CL 97 (L) 12/09/2021   CO2 24 12/09/2021   BUN 28 (H) 12/09/2021   CREATININE 6.36 (H) 12/09/2021   GLUCOSE 187 (H) 12/09/2021   GFRNONAA 7 (L) 12/09/2021   GFRAA 6 (L) 11/29/2019   CALCIUM 9.3 12/09/2021   PHOS 6.0 (H) 12/09/2021   PROT 6.9 11/06/2021   ALBUMIN 2.1 (L) 12/09/2021   BILITOT 0.9 11/06/2021   ALKPHOS 62 11/06/2021   AST 28 11/06/2021   ALT 7 11/06/2021   ANIONGAP 14 12/09/2021    GFR: Estimated Creatinine Clearance: 13.6 mL/min (A) (by C-G formula based on SCr of 6.36 mg/dL (H)).  No results found for this or any previous visit (from the past 240 hour(s)).    Radiology Studies: DG CHEST PORT 1 VIEW  Result Date: 12/10/2021 CLINICAL DATA:  [id number].  Vascular access problem. EXAM: PORTABLE CHEST 1 VIEW COMPARISON:  Portable chest 11/01/2021 FINDINGS: 5:13 a.m. Left IJ dialysis catheter again terminates in the distal SVC. Stable moderate cardiomegaly. There is no overt pulmonary edema. Small to moderate sized left pleural effusion partially tracks laterally and is smaller than previously. There is patchy opacity of the adjacent left lower lung field which could be atelectasis or pneumonia. The remaining lungs are clear.  There is aortic tortuosity and calcification with stable mediastinum. There is no pneumothorax.  No acute osseous abnormality. IMPRESSION: Cardiomegaly without CHF findings. Small to moderate left pleural effusion is smaller than previously with adjacent atelectasis or consolidation left lower lung field. Perihilar vascular congestion and mild central edema noted previously are not seen today. Electronically Signed   By: Telford Nab M.D.   On: 12/10/2021 07:26      LOS: 39 days    Cordelia Poche, MD [facility] 12/10/2021, 11:50 AM   If 7PM-7AM, please contact night-coverage [email or web address]

## 2021-12-10 NOTE — Progress Notes (Signed)
Tx terminated 11 min early d/t system clotting. Unable to return blood.

## 2021-12-11 DIAGNOSIS — I132 Hypertensive heart and chronic kidney disease with heart failure and with stage 5 chronic kidney disease, or end stage renal disease: Secondary | ICD-10-CM | POA: Diagnosis not present

## 2021-12-11 DIAGNOSIS — I5033 Acute on chronic diastolic (congestive) heart failure: Secondary | ICD-10-CM | POA: Diagnosis not present

## 2021-12-11 DIAGNOSIS — Z1152 Encounter for screening for COVID-19: Secondary | ICD-10-CM | POA: Diagnosis not present

## 2021-12-11 DIAGNOSIS — L89153 Pressure ulcer of sacral region, stage 3: Secondary | ICD-10-CM | POA: Diagnosis not present

## 2021-12-11 DIAGNOSIS — N186 End stage renal disease: Secondary | ICD-10-CM | POA: Diagnosis not present

## 2021-12-11 LAB — GLUCOSE, CAPILLARY
Glucose-Capillary: 95 mg/dL (ref 70–99)
Glucose-Capillary: 149 mg/dL — ABNORMAL HIGH (ref 70–99)
Glucose-Capillary: 102 mg/dL — ABNORMAL HIGH (ref 70–99)
Glucose-Capillary: 82 mg/dL (ref 70–99)

## 2021-12-11 MED ORDER — MELATONIN 3 MG PO TABS
3.0000 mg | ORAL_TABLET | Freq: Every day | ORAL | Status: DC
  Administered 2021-12-12: 3 mg via ORAL
  Filled 2021-12-11 (×6): qty 1

## 2021-12-11 MED ORDER — PANTOPRAZOLE SODIUM 40 MG PO TBEC
40.0000 mg | DELAYED_RELEASE_TABLET | Freq: Two times a day (BID) | ORAL | Status: DC
  Administered 2021-12-11 – 2021-12-16 (×10): 40 mg via ORAL
  Filled 2021-12-11 (×10): qty 1

## 2021-12-11 MED ORDER — CHLORHEXIDINE GLUCONATE CLOTH 2 % EX PADS: 6.0000 | MEDICATED_PAD | Freq: Every day | CUTANEOUS | Status: DC

## 2021-12-11 NOTE — Progress Notes (Signed)
Pt has been offered HD tx 3x today and she has refused all 3X. We called for her first rounds at about 0630 and she refused, then again at about 1100 and she refused, and then again at about 1430.

## 2021-12-11 NOTE — Progress Notes (Signed)
[facility] Progress Note   Subjective:   Seen in room - tired this AM, didn't sleep well overnight. Denies CP/dyspnea. Dialyzed yesterday d/t TDC issues on Monday. Due for HD again today, per her usual schedule -> will be later this afternoon.  Objective Vitals:   12/10/21 1800 12/10/21 1954 12/10/21 2226 12/11/21 0543  BP: 122/69 (!) 108/96 (!) 145/63 (!) 140/62  Pulse: 80 76 77 87  Resp: (!) 25 18 18 18   Temp: 98.3 F (36.8 C) 99 F (37.2 C)  99 F (37.2 C)  TempSrc: Oral Oral    SpO2: 100% 100% 100% 96%  Weight:      Height:       Physical Exam General: Chronically ill appearing, obese woman, NAD. Nasal O2 in place. Heart: RRR; no murmur Lungs: CTA anteriorly Abdomen: soft Extremities: B AKA, no stump edema Dialysis Access: [facility]  Additional Objective Labs: Basic Metabolic Panel: Recent Labs  Lab 12/08/21 0450 12/09/21 0950 12/10/21 2058  NA 133* 135 134*  K 3.3* 3.9 4.2  CL 96* 97* 98  CO2 26 24 25   GLUCOSE 88 187* 161*  BUN 17 28* 16  CREATININE 4.29* 6.36* 4.14*  CALCIUM 8.8* 9.3 8.4*  PHOS 4.6 6.0* 2.8   Liver Function Tests: Recent Labs  Lab 12/08/21 0450 12/09/21 0950 12/10/21 2058  ALBUMIN 2.2* 2.1* 2.1*   CBC: Recent Labs  Lab 12/06/21 0855 12/07/21 1555 12/09/21 0254 12/10/21 2058  WBC 14.5* 12.6* 12.7* 15.9*  NEUTROABS  --  8.2*  --   --   HGB 7.5* 7.3* 8.4* 7.8*  HCT 25.2* 23.2* 26.8* 24.9*  MCV 100.0 97.5 95.7 96.1  PLT 271 253 282 254   Studies/Results: DG CHEST PORT 1 VIEW  Result Date: 12/10/2021 CLINICAL DATA:  [id number].  Vascular access problem. EXAM: PORTABLE CHEST 1 VIEW COMPARISON:  Portable chest 11/01/2021 FINDINGS: 5:13 a.m. Left IJ dialysis catheter again terminates in the distal SVC. Stable moderate cardiomegaly. There is no overt pulmonary edema. Small to moderate sized left pleural effusion partially tracks laterally and is smaller than previously. There is patchy opacity of the adjacent left lower lung  field which could be atelectasis or pneumonia. The remaining lungs are clear. There is aortic tortuosity and calcification with stable mediastinum. There is no pneumothorax.  No acute osseous abnormality. IMPRESSION: Cardiomegaly without CHF findings. Small to moderate left pleural effusion is smaller than previously with adjacent atelectasis or consolidation left lower lung field. Perihilar vascular congestion and mild central edema noted previously are not seen today. Electronically Signed   By: Telford Nab M.D.   On: 12/10/2021 07:26    Medications:   aspirin EC  81 mg Oral Daily   atorvastatin  10 mg Oral Daily   Chlorhexidine Gluconate Cloth  6 each Topical Q0600   darbepoetin (ARANESP) injection - DIALYSIS  200 mcg Intravenous Q Mon-HD   docusate sodium  100 mg Oral BID   feeding supplement  1 Container Oral TID BM   gabapentin  100 mg Oral Q1200   gabapentin  200 mg Oral QHS   heparin  5,000 Units Subcutaneous Q8H   insulin aspart  0-5 Units Subcutaneous QHS   insulin aspart  0-6 Units Subcutaneous TID WC   levothyroxine  100 mcg Oral Q0600   midodrine  10 mg Oral Q M,W,F-HD   pantoprazole  40 mg Oral Daily   sevelamer carbonate  1.6 g Oral TID WC    Dialysis Orders: MWF - [facility]  4hrs, BFR 400, DFR 600,  AVF, EDW 146kg, 3K/ 2Ca - last hep B labs 8/21 - Heparin 2500 units IV with HD - Hectorol 54mcg IV q HD, held as of 10/30/21   Assessment/Plan: ESRD: Usual MWF schedule - poor HD on 9/4 due to recurrent TDC issues, able to be dialyzed on 9/5. Plan is for HD today again - her usual schedule. TDC malfunction: Has required two [facility] exchanges and having issues again on 9/4, currently with tPA dwell. IR thinks TDC is positional, did not have fibrin at last exchange. Discussed the need for a long term solution, VVS consulted - plan is for RUE AVF next week (12/19/21). Hypertension/volume: BP variable, was on low dose metoprolol and midodine -> metop discontinued on  9/1. Last hoyer weight was 125kg which is down significantly from OP EDW -> lower on discharge. Chronic respiratory failure with hypoxia on cannula oxygen at home: on 5L O2 at baseline. Anemia of ESRD: Hgb 7.8 - continue max dose Aranesp weekly while here.  Secondary hyperparathyroidism: CorrCa high, VDRA on hold. Phos ok. 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 Hypokalemia: Recurrent issue recently and dislikes Klor-Con tabs. Using high K HD bath. Disposition: Difficult situation. Prior to admit, patient required 6 staff members and 2 hoyer pads for transfer into HD chair as OP. Could not stay on dialysis for full treatment because of pain of multiple decubitus ulcers and PTAR unable to lift pt into ambulance for transport. SW unable to locate stretcher dialysis location at this time. Her weight is lower from admit -> can we attempt hoyering to recliner to see if manageable at this time -> will send message to PT. Continues to need SNF on discharge.  [name], PA-C 12/11/2021, 9:49 AM  [employer]

## 2021-12-11 NOTE — Progress Notes (Signed)
Mobility Specialist Progress Note:   12/11/21 1100  Mobility  Activity Turned to right side;Turned to left side ("hip walked")  Level of Assistance Minimal assist, patient does 75% or more  Activity Response Tolerated fair  $Mobility charge 1 Mobility   Pt eager for mobility session. Required minA for bed mobility. Pt wound dressing noted to be partially on/leaked through. OT in for session.   Nelta Numbers Acute Rehab Secure Chat or Office Phone: [phone]

## 2021-12-11 NOTE — Progress Notes (Signed)
[facility] PROGRESS NOTE  Patient: Courtney Gray OEV:[id number]   PCP: Monico Blitz, MD DOB: Aug 01, 1970   DOA: 11/01/2021   DOS: 12/11/2021    Subjective: Reports substernal/epigastric pain ongoing for last 2 3 days without any resolution.  No nausea no vomiting.  No fever no chills.  No diarrhea off of magnesium.  Oral intake is adequate.  She thinks it could be indigestion.  Feels like pressure.  Objective:   Vitals:   12/10/21 2226 12/11/21 0543 12/11/21 1038 12/11/21 1702  BP: (!) 145/63 (!) 140/62 (!) 133/30 (!) 151/80  Pulse: 77 87 87 81  Resp: 18 18 18 20   Temp:  99 F (37.2 C) 98.4 F (36.9 C) 98.3 F (36.8 C)  TempSrc:   Oral Oral  SpO2: 100% 96% 100% 95%  Weight:      Height:        S1-S2 present. Clear to auscultation. Bowel sound present.  Nontender. Bilateral AKA.  Assessment and plan: Active Issues Chest pain. EKG unremarkable.  Ongoing for last 2 days. I do not think this is cardiac in nature. Increase PPI to treat GI related chest pain symptoms. Continue pain medications.  ESRD on HD. Patient has refused HD on 9/6. Management per nephrology. Patient was encouraged to go for hemodialysis when recommended.  Leukocytosis. No acute etiology.  Monitor.  Brief Hospital course 51 y.o. female with medical history significant of ESRD on HD TTS, recent bacteremia and hepatic abscess on long-term IV antibiotics, chronic HF PEF, PVD status post bilateral AKA, IIDM, morbid obesity, chronic sacral wound stage II, presented with increasing shortness of breath.   Patient underwent bilateral AKA recently including right AKA in May and left AKA on 7/5.  And patient was recently diagnosed with septic shock secondary to bacteremia and liver abscess, for which patient has been on cefepime and Flagyl and the last dose will be August 4.  Recently, since patient has bilateral AKA, she has had extreme difficulty to get access to outpatient HD center.  She never missed her  dialysis however according to outpatient HD records, it takes at least 4 staff members and [facility] lift for patient to be seated in the dialysis chair.  Starting today, patient started to feel shortness of breath, no chest pain no swelling.  No cough no fever or chills. She is now awaiting placement for HD that takes stretcher and is hoping to find one locally.  Vascular surgery is considering work-up for fistula creation on the right upper extremity. Orthopedic Dr. Morrie Sheldon consulted.   Active Problems:   End stage renal disease (HCC)   Hypertension   Decubitus ulcer of sacral region, stage 2 (HCC)   Type 2 diabetes mellitus with hyperlipidemia (HCC)   Bacteremia   PVD (peripheral vascular disease) ([place])   Class 3 obesity ([place])    Author: Berle Mull, MD [facility] 12/11/2021 7:28 PM   If 7PM-7AM, please contact night-coverage at [email or web address]

## 2021-12-11 NOTE — Progress Notes (Addendum)
Patient complained of mid chest pain with SOB. Patient claimed that the pain has been on and off for a few days. Relieved with rest. Notified Dr. Posey Pronto. Patient is being transferred to HD.   Patient's lunch just arrived and patient would like to eat first before going to HD. Made her aware that HD is short staffed and do not know when her next availability is. Explained that HD might help her relieve SOB and chest pain. Still refused until her lunch is done.

## 2021-12-11 NOTE — Progress Notes (Signed)
Occupational Therapy Treatment Patient Details Name: Courtney Gray MRN: [id number] DOB: [birth year] Today's Date: 12/11/2021   History of present illness Pt adm 7/28 with acute pulmonary edema. 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   OT comments  Session completed with assist from mobility tech. Focused on bed mobility including rolling and supine to sit (long sitting) in bed  in order to increase functional performance during ADL tasks and progress transfer ability. Sacral would dressing changed by nurse during session due to drainage. Pt was provided max assist for positioning once session complete.    Recommendations for follow up therapy are one component of a multi-disciplinary discharge planning process, led by the attending physician.  Recommendations may be updated based on patient status, additional functional criteria and insurance authorization.    Follow Up Recommendations  Skilled nursing-short term rehab (<3 hours/day)    Assistance Recommended at Discharge Intermittent Supervision/Assistance  Patient can return home with the following  Two people to help with walking and/or transfers;Two people to help with bathing/dressing/bathroom;Assistance with cooking/housework;Direct supervision/assist for medications management;Assist for transportation;Help with stairs or ramp for entrance   Equipment Recommendations  Other (comment) (defer to next venue)       Precautions / Restrictions Precautions Precautions: Fall Precaution Comments: bil AKA, 3L O2 at baseline, sacral wound Restrictions Weight Bearing Restrictions: Yes RLE Weight Bearing: Non weight bearing LLE Weight Bearing: Non weight bearing Other Position/Activity Restrictions: minimized pushing/pulling and weight through L hand, dressing now removed       Mobility Bed Mobility Overal bed mobility: Needs Assistance Bed  Mobility: Rolling Rolling: Min assist   Supine to sit: +2 for physical assistance, HOB elevated, Mod assist (long sitting in bed) Sit to supine: Min assist, HOB elevated, +2 for physical assistance        Transfers Overall transfer level:  (not performed this session)                 ADL either performed or assessed with clinical judgement      Cognition Arousal/Alertness: Awake/alert Behavior During Therapy: WFL for tasks assessed/performed Overall Cognitive Status: Within Functional Limits for tasks assessed                     Pertinent Vitals/ Pain       Pain Assessment Pain Assessment: Faces Faces Pain Scale: No hurt         Frequency  Min 2X/week        Progress Toward Goals  OT Goals(current goals can now be found in the care plan section)  Progress towards OT goals: Progressing toward goals     Plan Discharge plan remains appropriate;Frequency remains appropriate       AM-PAC OT "6 Clicks" Daily Activity     Outcome Measure   Help from another person eating meals?: A Little Help from another person taking care of personal grooming?: A Little Help from another person toileting, which includes using toliet, bedpan, or urinal?: Total Help from another person bathing (including washing, rinsing, drying)?: A Lot Help from another person to put on and taking off regular upper body clothing?: A Little Help from another person to put on and taking off regular lower body clothing?: A Lot 6 Click Score: 14    End of Session Equipment Utilized During Treatment: Oxygen  OT Visit Diagnosis: Muscle weakness (generalized) (M62.81);History of falling (Z91.81)  Activity Tolerance Patient tolerated treatment well   Patient Left in bed;with call bell/phone within reach   Nurse Communication Other (comment) (need for wound dressing change)        Time: 0233-4356 OT Time Calculation (min): 22 min  Charges: OT General Charges $OT Visit: 1  Visit OT Treatments $Therapeutic Activity: 8-22 mins  {Jalexis Breed, OTR/L,CBIS  Supplemental OT - [facility] and [facility]   Degan Hanser, Clarene Duke 12/11/2021, 11:09 AM

## 2021-12-12 DIAGNOSIS — I5033 Acute on chronic diastolic (congestive) heart failure: Secondary | ICD-10-CM | POA: Insufficient documentation

## 2021-12-12 DIAGNOSIS — K219 Gastro-esophageal reflux disease without esophagitis: Secondary | ICD-10-CM | POA: Diagnosis present

## 2021-12-12 DIAGNOSIS — N186 End stage renal disease: Secondary | ICD-10-CM | POA: Diagnosis not present

## 2021-12-12 DIAGNOSIS — I739 Peripheral vascular disease, unspecified: Secondary | ICD-10-CM | POA: Diagnosis present

## 2021-12-12 DIAGNOSIS — S68113A Complete traumatic metacarpophalangeal amputation of left middle finger, initial encounter: Secondary | ICD-10-CM | POA: Diagnosis present

## 2021-12-12 DIAGNOSIS — K589 Irritable bowel syndrome without diarrhea: Secondary | ICD-10-CM | POA: Diagnosis present

## 2021-12-12 DIAGNOSIS — I5032 Chronic diastolic (congestive) heart failure: Secondary | ICD-10-CM | POA: Insufficient documentation

## 2021-12-12 LAB — CBC
HCT: 25.5 % — ABNORMAL LOW (ref 36.0–46.0)
HCT: 24.3 % — ABNORMAL LOW (ref 36.0–46.0)
Hemoglobin: 7.8 g/dL — ABNORMAL LOW (ref 12.0–15.0)
Hemoglobin: 7.2 g/dL — ABNORMAL LOW (ref 12.0–15.0)
MCH: 30.1 pg (ref 26.0–34.0)
MCH: 30.1 pg (ref 26.0–34.0)
MCHC: 30.6 g/dL (ref 30.0–36.0)
MCHC: 29.6 g/dL — ABNORMAL LOW (ref 30.0–36.0)
MCV: 98.5 fL (ref 80.0–100.0)
MCV: 101.7 fL — ABNORMAL HIGH (ref 80.0–100.0)
Platelets: 308 10*3/uL (ref 150–400)
Platelets: 305 10*3/uL (ref 150–400)
RBC: 2.59 MIL/uL — ABNORMAL LOW (ref 3.87–5.11)
RBC: 2.39 MIL/uL — ABNORMAL LOW (ref 3.87–5.11)
RDW: 15.7 % — ABNORMAL HIGH (ref 11.5–15.5)
RDW: 15.6 % — ABNORMAL HIGH (ref 11.5–15.5)
WBC: 16.2 10*3/uL — ABNORMAL HIGH (ref 4.0–10.5)
WBC: 15.5 10*3/uL — ABNORMAL HIGH (ref 4.0–10.5)
nRBC: 0.2 % (ref 0.0–0.2)
nRBC: 0.3 % — ABNORMAL HIGH (ref 0.0–0.2)

## 2021-12-12 LAB — GLUCOSE, CAPILLARY
Glucose-Capillary: 85 mg/dL (ref 70–99)
Glucose-Capillary: 167 mg/dL — ABNORMAL HIGH (ref 70–99)
Glucose-Capillary: 71 mg/dL (ref 70–99)
Glucose-Capillary: 171 mg/dL — ABNORMAL HIGH (ref 70–99)
Glucose-Capillary: 95 mg/dL (ref 70–99)

## 2021-12-12 LAB — RENAL FUNCTION PANEL
Albumin: 2.3 g/dL — ABNORMAL LOW (ref 3.5–5.0)
Albumin: 2.2 g/dL — ABNORMAL LOW (ref 3.5–5.0)
Anion gap: 13 (ref 5–15)
Anion gap: 9 (ref 5–15)
BUN: 27 mg/dL — ABNORMAL HIGH (ref 6–20)
BUN: 27 mg/dL — ABNORMAL HIGH (ref 6–20)
CO2: 27 mmol/L (ref 22–32)
CO2: 27 mmol/L (ref 22–32)
Calcium: 9.9 mg/dL (ref 8.9–10.3)
Calcium: 9.3 mg/dL (ref 8.9–10.3)
Chloride: 98 mmol/L (ref 98–111)
Chloride: 100 mmol/L (ref 98–111)
Creatinine, Ser: 6.87 mg/dL — ABNORMAL HIGH (ref 0.44–1.00)
Creatinine, Ser: 6.88 mg/dL — ABNORMAL HIGH (ref 0.44–1.00)
GFR, Estimated: 7 mL/min — ABNORMAL LOW (ref >60)
GFR, Estimated: 7 mL/min — ABNORMAL LOW (ref >60)
Glucose, Bld: 91 mg/dL (ref 70–99)
Glucose, Bld: 165 mg/dL — ABNORMAL HIGH (ref 70–99)
Phosphorus: 5.7 mg/dL — ABNORMAL HIGH (ref 2.5–4.6)
Phosphorus: 5.4 mg/dL — ABNORMAL HIGH (ref 2.5–4.6)
Potassium: 4.7 mmol/L (ref 3.5–5.1)
Potassium: 4.4 mmol/L (ref 3.5–5.1)
Sodium: 138 mmol/L (ref 135–145)
Sodium: 136 mmol/L (ref 135–145)

## 2021-12-12 MED ORDER — HEPARIN SODIUM (PORCINE) 1000 UNIT/ML IJ SOLN
INTRAMUSCULAR | Status: AC
  Filled 2021-12-12: qty 4

## 2021-12-12 NOTE — Progress Notes (Signed)
Received patient in bed to unit.  Alert and oriented.  Informed consent signed and in chart.   Treatment initiated: 0840 Treatment completed: 1241  Patient tolerated well.  Transported back to the room  Alert, without acute distress.  Hand-off given to patient's nurse.   Access used: Cath Access issues: Venous poor blood return.  Total UF removed: 2.3L Medication(s) given: None Post HD VS: 126/47,77,100%,2497.9 Post HD weight: bed scale broken.   [facility]

## 2021-12-12 NOTE — Progress Notes (Addendum)
[facility] Progress Note   Subjective:   Seen at start of HD - 2.5L UFG and tolerating. Had refused HD yesterday - dislikes coming back to back for HD. Offered to come Sat this week instead of tomorrow, for which she agrees. Per notes, was dyspneic yesterday - she reports it was due to the fact that her oxygen line was accidentally left connected to a portable tank which ran out. No CP/dyspnea today.  Objective Vitals:   12/11/21 1038 12/11/21 1702 12/11/21 2128 12/12/21 0422  BP: (!) 133/30 (!) 151/80 (!) 139/52 129/65  Pulse: 87 81 90 90  Resp: 18 20 18 19   Temp: 98.4 F (36.9 C) 98.3 F (36.8 C) 98.6 F (37 C) 99.1 F (37.3 C)  TempSrc: Oral Oral  Oral  SpO2: 100% 95% 100% 100%  Weight:      Height:       Physical Exam General: Chronically ill appearing, obese woman, NAD. Nasal O2 in place. Heart: RRR; no murmur Lungs: CTA anteriorly Abdomen: soft Extremities: B AKA, no stump edema Dialysis Access: [facility]  Additional Objective Labs: Basic Metabolic Panel: Recent Labs  Lab 12/09/21 0950 12/10/21 2058 12/12/21 0627  NA 135 134* 138  K 3.9 4.2 4.7  CL 97* 98 98  CO2 24 25 27   GLUCOSE 187* 161* 91  BUN 28* 16 27*  CREATININE 6.36* 4.14* 6.87*  CALCIUM 9.3 8.4* 9.9  PHOS 6.0* 2.8 5.7*   Liver Function Tests: Recent Labs  Lab 12/09/21 0950 12/10/21 2058 12/12/21 0627  ALBUMIN 2.1* 2.1* 2.3*   CBC: Recent Labs  Lab 12/06/21 0855 12/07/21 1555 12/09/21 0254 12/10/21 2058 12/12/21 0627  WBC 14.5* 12.6* 12.7* 15.9* 16.2*  NEUTROABS  --  8.2*  --   --   --   HGB 7.5* 7.3* 8.4* 7.8* 7.8*  HCT 25.2* 23.2* 26.8* 24.9* 25.5*  MCV 100.0 97.5 95.7 96.1 98.5  PLT 271 253 282 254 308   Medications:   aspirin EC  81 mg Oral Daily   atorvastatin  10 mg Oral Daily   Chlorhexidine Gluconate Cloth  6 each Topical Q0600   Chlorhexidine Gluconate Cloth  6 each Topical Q0600   darbepoetin (ARANESP) injection - DIALYSIS  200 mcg Intravenous Q Mon-HD    docusate sodium  100 mg Oral BID   feeding supplement  1 Container Oral TID BM   gabapentin  100 mg Oral Q1200   gabapentin  200 mg Oral QHS   heparin  5,000 Units Subcutaneous Q8H   insulin aspart  0-5 Units Subcutaneous QHS   insulin aspart  0-6 Units Subcutaneous TID WC   levothyroxine  100 mcg Oral Q0600   melatonin  3 mg Oral QHS   midodrine  10 mg Oral Q M,W,F-HD   pantoprazole  40 mg Oral BID AC   sevelamer carbonate  1.6 g Oral TID WC    Dialysis Orders: MWF - [facility]  4hrs, BFR 400, DFR 600,  AVF, EDW 146kg, 3K/ 2Ca - last hep B labs 8/21 - Heparin 2500 units IV with HD - Hectorol 55mcg IV q HD, held as of 10/30/21   Assessment/Plan: ESRD: Usual MWF schedule - poor HD on 9/4 due to recurrent TDC issues, able to be dialyzed on 9/5. HD today after refusing yesterday -> will run her next on Saturday this week, then back to usual schedule next week. TDC malfunction: Has required two [facility] exchanges and having issues again on 9/4, currently with tPA  dwell. IR thinks TDC is positional, did not have fibrin at last exchange. Discussed the need for a long term solution, VVS consulted - plan is for RUE AVF next week (12/19/21). Hypertension/volume: BP variable, was on low dose metoprolol and midodine -> metop discontinued on 9/1. Last hoyer weight was 125kg which is down significantly from OP EDW -> lower on discharge. Chronic respiratory failure with hypoxia on cannula oxygen at home: on 5L O2 at baseline. Anemia of ESRD: Hgb 7.8 - continue max dose Aranesp weekly while here.  Secondary hyperparathyroidism: CorrCa high, VDRA on hold. Phos up slightly, follow for now. 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 Hypokalemia: Recurrent issue recently and dislikes Klor-Con tabs. Using high K HD bath - K 4.7 today. Disposition: Difficult situation. Prior to admit, patient required 6 staff members and 2 hoyer pads for transfer into HD chair as OP. Could not  stay on dialysis for full treatment because of pain of multiple decubitus ulcers and PTAR unable to lift pt into ambulance for transport. SW has been unable to locate stretcher dialysis location so far. Her weight is lower from admit, asked PT about trying to hoyer to recliner for HD -> there is concern for worsening her chronic wounds - unclear path at this time. Continues to need SNF on discharge.    [name], PA-C 12/12/2021, 8:15 AM  [facility]  ADDENDUM 14:08p: Dialysis RN reports clotting in HD lines at end of HD - had not been using heparin with HD here, but will make sure to start next HD. [place]

## 2021-12-12 NOTE — Progress Notes (Signed)
PT Cancellation Note  Patient Details Name: Courtney Gray MRN: [id number] DOB: 07/29/1970   Cancelled Treatment:    Reason Eval/Treat Not Completed: Patient at procedure or test/unavailable   Gone for dialysis.   [place]  Office [phone]   [name] 12/12/2021, 9:33 AM

## 2021-12-12 NOTE — Progress Notes (Signed)
[facility] PROGRESS NOTE  Patient: Courtney Gray HTD:[id number]   PCP: Monico Blitz, MD DOB: 1970/04/14   DOA: 11/01/2021   DOS: 12/12/2021    Subjective: Patient reports chest pain resolved.  No nausea no vomiting no fever no chills.  She is concerned that she did not receive heparin with her hemodialysis for last 2 sessions.  No bleeding reported by the patient.  Objective:   Vitals:   12/12/21 1241 12/12/21 1244 12/12/21 1411 12/12/21 1715  BP: (!) 126/47  (!) 116/44 (!) 126/52  Pulse: 77  80 79  Resp: (!) 25  16 18   Temp:  98.7 F (37.1 C) 98.6 F (37 C) 99.1 F (37.3 C)  TempSrc:   Oral   SpO2: 100%   100%  Weight: 122.7 kg     Height:        S1-S2 present. Clear to auscultation. Bowel sound present. No tenderness.  Assessment and plan: Active Issues Chest pain. Most likely GI in nature. Continue PPI twice daily for 1 week followed by daily PPI.  Issues with HD catheter with clotting. We will ensure that the patient receives heparin therapy as long as it is not contraindicated with HD.  Brief Hospital course 51 y.o. female with medical history significant of ESRD on HD TTS, recent bacteremia and hepatic abscess on long-term IV antibiotics, chronic HF PEF, PVD status post bilateral AKA, IIDM, morbid obesity, chronic sacral wound stage II, presented with increasing shortness of breath.   Patient underwent bilateral AKA recently including right AKA in May and left AKA on 7/5.  And patient was recently diagnosed with septic shock secondary to bacteremia and liver abscess, for which patient has been on cefepime and Flagyl and the last dose will be August 4.  Recently, since patient has bilateral AKA, she has had extreme difficulty to get access to outpatient HD center.  She never missed her dialysis however according to outpatient HD records, it takes at least 4 staff members and [facility] lift for patient to be seated in the dialysis chair.  Starting today, patient started  to feel shortness of breath, no chest pain no swelling.  No cough no fever or chills. She is now awaiting placement for HD that takes stretcher and is hoping to find one locally.  Vascular surgery is considering work-up for fistula creation on the right upper extremity. Orthopedic Dr. Morrie Sheldon consulted.   Principal Problem:   End stage renal disease ([place]) Active Problems:   Hypertension   Decubitus ulcer of sacral region, stage 2 (HCC)   Type 2 diabetes mellitus with hyperlipidemia (HCC)   Chronic respiratory failure with hypoxia (HCC)   Bacteremia   PVD (peripheral vascular disease) (HCC)   Class 3 obesity (HCC)   Anemia due to chronic kidney disease   Hypertensive emergency   Leukocytosis   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HCC)   Pressure injury of skin   Acute on chronic diastolic CHF (congestive heart failure) (HCC)   PAD (peripheral artery disease) (HCC)   Irritable bowel syndrome (IBS)   Amputation of left middle finger   GERD (gastroesophageal reflux disease)    Author: Berle Mull, MD [facility] 12/12/2021 7:13 PM   If 7PM-7AM, please contact night-coverage at [email or web address]

## 2021-12-12 NOTE — Progress Notes (Signed)
Physical Therapy Treatment Patient Details Name: Courtney Gray MRN: [id number] DOB: 02-24-1971 Today's Date: 12/12/2021   History of Present Illness Pt adm 7/28 with acute pulmonary edema. 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    PT Comments    Patient back from dialysis for several hours, had eaten, and agreeable to PT for strengthening and flexibility. She below for detailed exercises.    Recommendations for follow up therapy are one component of a multi-disciplinary discharge planning process, led by the attending physician.  Recommendations may be updated based on patient status, additional functional criteria and insurance authorization.  Follow Up Recommendations  Acute inpatient rehab (3hours/day) Can patient physically be transported by private vehicle: No   Assistance Recommended at Discharge Frequent or constant Supervision/Assistance  Patient can return home with the following Two people to help with walking and/or transfers;Two people to help with bathing/dressing/bathroom;Assist for transportation;Assistance with cooking/housework;Help with stairs or ramp for entrance   Equipment Recommendations  Wheelchair (measurements PT);Wheelchair cushion (measurements PT);Other (comment);Hospital bed    Recommendations for Other Services       Precautions / Restrictions Precautions Precautions: Fall Precaution Comments: bil AKA, 3L O2 at baseline, LUQ drain Restrictions Weight Bearing Restrictions: Yes RLE Weight Bearing: Non weight bearing LLE Weight Bearing: Non weight bearing Other Position/Activity Restrictions: minimized pushing/pulling and weight through L hand, dressing now removed     Mobility  Bed Mobility Overal bed mobility: Needs Assistance Bed Mobility: Rolling Rolling: Min assist   Supine to sit: HOB elevated, Min guard Sit to supine: Supervision   General bed  mobility comments: rolling to each side;  from [facility] elevated enough for her to reach rails, did 10 sit-ups to full sitting; lowered HOB incrementally each time; coordinating breathing with exercise    Transfers                        Ambulation/Gait               General Gait Details: stated she could not work on "butt/hip walk" due to pain in rt ischium   [name], [name] (indicate cue type and reason): did not want to be lifted to wheelchair due to pain in rt ischial wound  Modified Rankin (Stroke Patients Only)       Balance Overall balance assessment: Needs assistance Sitting-balance support: No upper extremity supported Sitting balance-[name] Scale: Fair Sitting balance - Comments: long-sitting in bed                                    Cognition Arousal/Alertness: Awake/alert Behavior During Therapy: WFL for tasks assessed/performed Overall Cognitive Status: Within Functional Limits for tasks assessed                                          Exercises General Exercises - Lower Extremity Hip ABduction/ADduction: Supine, Both, 20 reps Straight Leg Raises: Supine, Both, 20 reps Other Exercises Other Exercises: sidelying hip extension x 5 reps followed by prolonged stretch x 30 sec Other Exercises: supine "windshield wipers" with hips flexed at 90 (both legs moving together from  right to center to left to center    General Comments        Pertinent Vitals/Pain Pain Assessment Pain Assessment: Faces Faces Pain Scale: Hurts even more Pain Location: R-ischial wound Pain Descriptors / Indicators: Discomfort, Guarding, Grimacing, Moaning, Sore, Sharp, Throbbing Pain Intervention(s): Limited activity within patient's tolerance, Monitored during session, Premedicated before session, Repositioned    Home Living                           Prior Function            PT Goals (current goals can now be found in the care plan section) Acute Rehab PT Goals Patient Stated Goal: ultimately wants to be modified independent at w/c level PT Goal Formulation: With patient Time For Goal Achievement: 12/24/21 Potential to Achieve Goals: Fair Progress towards PT goals: Progressing toward goals    Frequency    Min 3X/week      PT Plan Current plan remains appropriate    Co-evaluation              AM-PAC PT "6 Clicks" Mobility   Outcome Measure  Help needed turning from your back to your side while in a flat bed without using bedrails?: A Little Help needed moving from lying on your back to sitting on the side of a flat bed without using bedrails?: A Lot Help needed moving to and from a bed to a chair (including a wheelchair)?: Total Help needed standing up from a chair using your arms (e.g., wheelchair or bedside chair)?: Total Help needed to walk in hospital room?: Total Help needed climbing 3-5 steps with a railing? : Total 6 Click Score: 9    End of Session Equipment Utilized During Treatment: Oxygen Activity Tolerance: Patient tolerated treatment well Patient left: in bed;with call bell/phone within reach   PT Visit Diagnosis: Other abnormalities of gait and mobility (R26.89);Muscle weakness (generalized) (M62.81) Pain - Right/Left: Right Pain - part of body: Leg     Time: 6761-9509 PT Time Calculation (min) (ACUTE ONLY): 41 min  Charges:  $Therapeutic Exercise: 38-52 mins                      [place]  Office [phone]    [name] 12/12/2021, 4:24 PM

## 2021-12-13 DIAGNOSIS — N186 End stage renal disease: Secondary | ICD-10-CM | POA: Diagnosis not present

## 2021-12-13 LAB — CBC
HCT: 26 % — ABNORMAL LOW (ref 36.0–46.0)
Hemoglobin: 7.6 g/dL — ABNORMAL LOW (ref 12.0–15.0)
MCH: 29.8 pg (ref 26.0–34.0)
MCHC: 29.2 g/dL — ABNORMAL LOW (ref 30.0–36.0)
MCV: 102 fL — ABNORMAL HIGH (ref 80.0–100.0)
Platelets: 271 10*3/uL (ref 150–400)
RBC: 2.55 MIL/uL — ABNORMAL LOW (ref 3.87–5.11)
RDW: 15.8 % — ABNORMAL HIGH (ref 11.5–15.5)
WBC: 13.9 10*3/uL — ABNORMAL HIGH (ref 4.0–10.5)
nRBC: 0.1 % (ref 0.0–0.2)

## 2021-12-13 LAB — RENAL FUNCTION PANEL
Albumin: 2.3 g/dL — ABNORMAL LOW (ref 3.5–5.0)
Anion gap: 10 (ref 5–15)
BUN: 19 mg/dL (ref 6–20)
CO2: 26 mmol/L (ref 22–32)
Calcium: 9.3 mg/dL (ref 8.9–10.3)
Chloride: 99 mmol/L (ref 98–111)
Creatinine, Ser: 5.12 mg/dL — ABNORMAL HIGH (ref 0.44–1.00)
GFR, Estimated: 10 mL/min — ABNORMAL LOW (ref >60)
Glucose, Bld: 137 mg/dL — ABNORMAL HIGH (ref 70–99)
Phosphorus: 4.8 mg/dL — ABNORMAL HIGH (ref 2.5–4.6)
Potassium: 4.2 mmol/L (ref 3.5–5.1)
Sodium: 135 mmol/L (ref 135–145)

## 2021-12-13 LAB — GLUCOSE, CAPILLARY
Glucose-Capillary: 83 mg/dL (ref 70–99)
Glucose-Capillary: 130 mg/dL — ABNORMAL HIGH (ref 70–99)
Glucose-Capillary: 90 mg/dL (ref 70–99)
Glucose-Capillary: 132 mg/dL — ABNORMAL HIGH (ref 70–99)

## 2021-12-13 MED ORDER — ALTEPLASE 2 MG IJ SOLR
2.0000 mg | Freq: Once | INTRAMUSCULAR | Status: DC | PRN
  Filled 2021-12-13: qty 2

## 2021-12-13 MED ORDER — LIDOCAINE HCL (PF) 1 % IJ SOLN: 5.0000 mL | INTRAMUSCULAR | Status: DC | PRN

## 2021-12-13 MED ORDER — HEPARIN SODIUM (PORCINE) 1000 UNIT/ML DIALYSIS
1000.0000 [IU] | INTRAMUSCULAR | Status: DC | PRN
Start: 2021-12-13 — End: 2021-12-14

## 2021-12-13 MED ORDER — LIDOCAINE-PRILOCAINE 2.5-2.5 % EX CREA: 1.0000 | TOPICAL_CREAM | CUTANEOUS | Status: DC | PRN

## 2021-12-13 MED ORDER — PENTAFLUOROPROP-TETRAFLUOROETH EX AERO
1.0000 | INHALATION_SPRAY | CUTANEOUS | Status: DC | PRN
Start: 2021-12-13 — End: 2021-12-14

## 2021-12-13 MED ORDER — DOCUSATE SODIUM 100 MG PO CAPS
100.0000 mg | ORAL_CAPSULE | Freq: Every day | ORAL | Status: DC | PRN
  Administered 2022-01-05 – 2022-01-10 (×5): 100 mg via ORAL
  Filled 2021-12-13 (×4): qty 1

## 2021-12-13 NOTE — Progress Notes (Signed)
[facility] PROGRESS NOTE  Patient: Courtney Gray HQP:[id number]   PCP: Monico Blitz, MD DOB: [birth year]   DOA: 11/01/2021   DOS: 12/13/2021    Subjective: No acute complaint.  No nausea no vomiting no fever no chills.  Objective:   Vitals:   12/12/21 2123 12/13/21 0449 12/13/21 0950 12/13/21 1649  BP: (!) 112/37 (!) 137/56 (!) 132/47 123/69  Pulse: 86 75 75 84  Resp: 19 20 19 17   Temp: 98.1 F (36.7 C) 98.6 F (37 C) 98.1 F (36.7 C) (!) 97.1 F (36.2 C)  TempSrc: Oral Oral Oral Oral  SpO2: 100% 100% 100% 100%  Weight:      Height:        S1 is present. Bowel sound present.  Assessment and plan: Active Issues ESRD on HD. Patient has not been receiving heparin during her HD session. Current order for HD on 9/9 also does not have any heparin. We will discuss with nephrology tomorrow.  Brief Hospital course 51 y.o. female with medical history significant of ESRD on HD TTS, recent bacteremia and hepatic abscess on long-term IV antibiotics, chronic HF PEF, PVD status post bilateral AKA, IIDM, morbid obesity, chronic sacral wound stage II, presented with increasing shortness of breath.   Patient underwent bilateral AKA recently including right AKA in May and left AKA on 7/5.  And patient was recently diagnosed with septic shock secondary to bacteremia and liver abscess, for which patient has been on cefepime and Flagyl and the last dose will be August 4.  Recently, since patient has bilateral AKA, she has had extreme difficulty to get access to outpatient HD center.  She never missed her dialysis however according to outpatient HD records, it takes at least 4 staff members and [facility] lift for patient to be seated in the dialysis chair.  Starting today, patient started to feel shortness of breath, no chest pain no swelling.  No cough no fever or chills. She is now awaiting placement for HD that takes stretcher and is hoping to find one locally.  Vascular surgery is considering  work-up for fistula creation on the right upper extremity. Orthopedic Dr. Morrie Sheldon consulted.   Principal Problem:   End stage renal disease ([place]) Active Problems:   Hypertension   Decubitus ulcer of sacral region, stage 2 (HCC)   Type 2 diabetes mellitus with hyperlipidemia (HCC)   Chronic respiratory failure with hypoxia (HCC)   Bacteremia   PVD (peripheral vascular disease) (HCC)   Class 3 obesity (HCC)   Anemia due to chronic kidney disease   Hypertensive emergency   Leukocytosis   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HCC)   Pressure injury of skin   Acute on chronic diastolic CHF (congestive heart failure) (HCC)   PAD (peripheral artery disease) (HCC)   Irritable bowel syndrome (IBS)   Amputation of left middle finger   GERD (gastroesophageal reflux disease)    Author: Berle Mull, MD [facility] 12/13/2021 6:38 PM   If 7PM-7AM, please contact night-coverage at [email or web address]

## 2021-12-13 NOTE — Progress Notes (Signed)
[facility] Progress Note   Subjective:    Seen and examined patient at bedside. Noted previous issues with TDC. Tolerated yesterday's HD with net UF 2.3L. Denies SOB, CP, and N/V. Plan for HD 9/9 then back on Monday per her usual schedule.  Objective Vitals:   12/12/21 1715 12/12/21 2123 12/13/21 0449 12/13/21 0950  BP: (!) 126/52 (!) 112/37 (!) 137/56 (!) 132/47  Pulse: 79 86 75 75  Resp: 18 19 20 19   Temp: 99.1 F (37.3 C) 98.1 F (36.7 C) 98.6 F (37 C) 98.1 F (36.7 C)  TempSrc:  Oral Oral Oral  SpO2: 100% 100% 100% 100%  Weight:      Height:       Physical Exam General: Well-appearing, obese woman, NAD. Nasal O2 in place. Heart: RRR; no murmur Lungs: CTA anteriorly Abdomen: soft Extremities: B AKA, no stump edema Dialysis Access: [facility]  Filed Weights   12/12/21 0831 12/12/21 1241  Weight: 125 kg 122.7 kg    Intake/Output Summary (Last 24 hours) at 12/13/2021 1322 Last data filed at 12/13/2021 1000 Gross per 24 hour  Intake 1884 ml  Output 0 ml  Net 1884 ml    Additional Objective Labs: Basic Metabolic Panel: Recent Labs  Lab 12/12/21 0627 12/12/21 0823 12/13/21 0839  NA 138 136 135  K 4.7 4.4 4.2  CL 98 100 99  CO2 27 27 26   GLUCOSE 91 165* 137*  BUN 27* 27* 19  CREATININE 6.87* 6.88* 5.12*  CALCIUM 9.9 9.3 9.3  PHOS 5.7* 5.4* 4.8*   Liver Function Tests: Recent Labs  Lab 12/12/21 0627 12/12/21 0823 12/13/21 0839  ALBUMIN 2.3* 2.2* 2.3*   No results for input(s): "LIPASE", "AMYLASE" in the last 168 hours. CBC: Recent Labs  Lab 12/07/21 1555 12/09/21 0254 12/10/21 2058 12/12/21 0627 12/12/21 0823 12/13/21 0839  WBC 12.6* 12.7* 15.9* 16.2* 15.5* 13.9*  NEUTROABS 8.2*  --   --   --   --   --   HGB 7.3* 8.4* 7.8* 7.8* 7.2* 7.6*  HCT 23.2* 26.8* 24.9* 25.5* 24.3* 26.0*  MCV 97.5 95.7 96.1 98.5 101.7* 102.0*  PLT 253 282 254 308 305 271   Blood Culture    Component Value Date/Time   SDES BLOOD LEFT ANTECUBITAL  10/16/2021 1534   SPECREQUEST  10/16/2021 1534    BOTTLES DRAWN AEROBIC AND ANAEROBIC Blood Culture results may not be optimal due to an inadequate volume of blood received in culture bottles   CULT  10/16/2021 1534    NO GROWTH 5 DAYS Performed at [facility], [place] [address] [place], [place] [address]    REPTSTATUS 10/21/2021 FINAL 10/16/2021 1534    Cardiac Enzymes: No results for input(s): "CKTOTAL", "CKMB", "CKMBINDEX", "TROPONINI" in the last 168 hours. CBG: Recent Labs  Lab 12/12/21 1344 12/12/21 1714 12/12/21 2044 12/13/21 0737 12/13/21 1133  GLUCAP 71 171* 95 83 130*   Iron Studies: No results for input(s): "IRON", "TIBC", "TRANSFERRIN", "FERRITIN" in the last 72 hours. Lab Results  Component Value Date   INR 1.3 (H) 10/09/2021   INR 1.3 (H) 09/10/2021   INR 1.3 (H) 07/10/2021   Studies/Results: No results found.  Medications:   aspirin EC  81 mg Oral Daily   atorvastatin  10 mg Oral Daily   Chlorhexidine Gluconate Cloth  6 each Topical Q0600   Chlorhexidine Gluconate Cloth  6 each Topical Q0600   darbepoetin (ARANESP) injection - DIALYSIS  200 mcg Intravenous Q Mon-HD  docusate sodium  100 mg Oral BID   feeding supplement  1 Container Oral TID BM   gabapentin  100 mg Oral Q1200   gabapentin  200 mg Oral QHS   heparin  5,000 Units Subcutaneous Q8H   insulin aspart  0-5 Units Subcutaneous QHS   insulin aspart  0-6 Units Subcutaneous TID WC   levothyroxine  100 mcg Oral Q0600   melatonin  3 mg Oral QHS   midodrine  10 mg Oral Q M,W,F-HD   pantoprazole  40 mg Oral BID AC   sevelamer carbonate  1.6 g Oral TID WC    Dialysis Orders: MWF - [facility]  4hrs, BFR 400, DFR 600,  AVF, EDW 146kg, 3K/ 2Ca - last hep B labs 8/21 - Heparin 2500 units IV with HD - Hectorol 7mcg IV q HD, held as of 10/30/21  Assessment/Plan: ESRD: Usual MWF schedule - poor HD on 9/4 due to recurrent TDC issues, able to be dialyzed on 9/5. Received HD yesterday  -> will run her next on Saturday (9/9) this week, then back to usual schedule next week. TDC malfunction: Has required two [facility] exchanges and having issues again on 9/4, currently with tPA dwell. IR thinks TDC is positional, did not have fibrin at last exchange. Discussed the need for a long term solution, VVS consulted - plan is for RUE AVF next week (12/19/21). Hypertension/volume: BP variable, was on low dose metoprolol and midodine -> metop discontinued on 9/1. Last hoyer weight was 125kg which is down significantly from OP EDW -> lower on discharge. Chronic respiratory failure with hypoxia on cannula oxygen at home: on 5L O2 at baseline. Anemia of ESRD: Hgb 7.8 - continue max dose Aranesp weekly while here.  Secondary hyperparathyroidism: CorrCa high, VDRA on hold. Phos up slightly, follow for now. 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 Hypokalemia: Recurrent issue recently and dislikes Klor-Con tabs. Using high K HD bath - K 4.7 today. Disposition: Difficult situation. Prior to admit, patient required 6 staff members and 2 hoyer pads for transfer into HD chair as OP. Could not stay on dialysis for full treatment because of pain of multiple decubitus ulcers and PTAR unable to lift pt into ambulance for transport. SW has been unable to locate stretcher dialysis location so far. Her weight is lower from admit, asked PT about trying to hoyer to recliner for HD -> there is concern for worsening her chronic wounds - unclear path at this time. Continues to need SNF on discharge.  Tobie Poet, NP [facility] 12/13/2021,1:22 PM  LOS: 42 days

## 2021-12-13 NOTE — Progress Notes (Signed)
Mobility Specialist Progress Note:   12/13/21 1400  Mobility  Activity Turned to left side;Turned to right side;Turned to back - supine (bed level exercises)  Range of Motion/Exercises Active;All extremities  Level of Assistance +2 (takes two people)  Assistive Device None  Activity Response Tolerated fair  $Mobility charge 1 Mobility   Pt eager for mobility session. Performed multiple bed level exercises. Pt c/o significant pain from buttock wound. RN notified. Pt left with all needs met.   Nelta Numbers [facility] Secure Chat or Office Phone: [phone]

## 2021-12-13 NOTE — Progress Notes (Signed)
Mobility Specialist Progress Note:   12/13/21 1500  Mobility  Activity Turned to right side;Turned to left side;Turned to back - supine  Level of Assistance +2 (takes two people)  Activity Response Tolerated fair  $Mobility charge 1 Mobility   RN in to change wound dressing. Pt required +2 to turn in bed. Pt tolerated dressing change well, still c/o pain. RN giving pain medicine at EOS. Left with all needs met.   Nelta Numbers [facility] Secure Chat or Office Phone: [phone]

## 2021-12-14 DIAGNOSIS — N186 End stage renal disease: Secondary | ICD-10-CM | POA: Diagnosis not present

## 2021-12-14 LAB — CBC
HCT: 24.3 % — ABNORMAL LOW (ref 36.0–46.0)
Hemoglobin: 7.1 g/dL — ABNORMAL LOW (ref 12.0–15.0)
MCH: 29.8 pg (ref 26.0–34.0)
MCHC: 29.2 g/dL — ABNORMAL LOW (ref 30.0–36.0)
MCV: 102.1 fL — ABNORMAL HIGH (ref 80.0–100.0)
Platelets: 284 10*3/uL (ref 150–400)
RBC: 2.38 MIL/uL — ABNORMAL LOW (ref 3.87–5.11)
RDW: 15.9 % — ABNORMAL HIGH (ref 11.5–15.5)
WBC: 14 10*3/uL — ABNORMAL HIGH (ref 4.0–10.5)
nRBC: 0 % (ref 0.0–0.2)

## 2021-12-14 LAB — GLUCOSE, CAPILLARY
Glucose-Capillary: 105 mg/dL — ABNORMAL HIGH (ref 70–99)
Glucose-Capillary: 97 mg/dL (ref 70–99)
Glucose-Capillary: 171 mg/dL — ABNORMAL HIGH (ref 70–99)
Glucose-Capillary: 112 mg/dL — ABNORMAL HIGH (ref 70–99)

## 2021-12-14 LAB — RENAL FUNCTION PANEL
Albumin: 2.3 g/dL — ABNORMAL LOW (ref 3.5–5.0)
Anion gap: 10 (ref 5–15)
BUN: 29 mg/dL — ABNORMAL HIGH (ref 6–20)
CO2: 27 mmol/L (ref 22–32)
Calcium: 9.2 mg/dL (ref 8.9–10.3)
Chloride: 98 mmol/L (ref 98–111)
Creatinine, Ser: 6.74 mg/dL — ABNORMAL HIGH (ref 0.44–1.00)
GFR, Estimated: 7 mL/min — ABNORMAL LOW (ref >60)
Glucose, Bld: 125 mg/dL — ABNORMAL HIGH (ref 70–99)
Phosphorus: 5.6 mg/dL — ABNORMAL HIGH (ref 2.5–4.6)
Potassium: 4.8 mmol/L (ref 3.5–5.1)
Sodium: 135 mmol/L (ref 135–145)

## 2021-12-14 MED ORDER — LUBIPROSTONE 8 MCG PO CAPS
8.0000 ug | ORAL_CAPSULE | ORAL | Status: DC
  Administered 2021-12-14 – 2022-01-18 (×6): 8 ug via ORAL
  Filled 2021-12-14 (×10): qty 1

## 2021-12-14 MED ORDER — LUBIPROSTONE 8 MCG PO CAPS
8.0000 ug | ORAL_CAPSULE | ORAL | Status: DC
  Administered 2021-12-17 – 2022-01-21 (×5): 8 ug via ORAL
  Filled 2021-12-14 (×6): qty 1

## 2021-12-14 MED ORDER — HEPARIN SODIUM (PORCINE) 1000 UNIT/ML IJ SOLN
2500.0000 [IU] | INTRAMUSCULAR | Status: DC | PRN
  Administered 2021-12-16: 3000 [IU] via INTRAVENOUS
  Administered 2021-12-20 – 2022-01-04 (×2): 2500 [IU] via INTRAVENOUS
  Filled 2021-12-14 (×7): qty 3

## 2021-12-14 MED ORDER — HEPARIN SODIUM (PORCINE) 1000 UNIT/ML IJ SOLN
INTRAMUSCULAR | Status: AC
  Filled 2021-12-14: qty 3

## 2021-12-14 NOTE — Progress Notes (Signed)
[facility] Progress Note   Subjective:    Seen and examined patient on HD. Tolerating UFG 3L. TDC still not working at its best. Plan for AVF placement next week. Heparin boluses resumed per outpatient HD orders.  Objective Vitals:   12/14/21 1100 12/14/21 1130 12/14/21 1200 12/14/21 1216  BP: (!) 147/59 (!) 145/55 (!) 117/51 (!) 118/43  Pulse: 73 73 74 74  Resp: 16 15 20 13   Temp:      TempSrc:      SpO2: 100% 100% 100% 100%  Weight:      Height:       Physical Exam General: Well-appearing, obese woman, NAD. Nasal O2 in place. Heart: RRR; no murmur Lungs: CTA anteriorly Abdomen: soft Extremities: B AKA, no stump edema Dialysis Access: [facility]  Filed Weights   12/12/21 0831 12/12/21 1241  Weight: 125 kg 122.7 kg    Intake/Output Summary (Last 24 hours) at 12/14/2021 1258 Last data filed at 12/14/2021 1216 Gross per 24 hour  Intake 460 ml  Output 3 ml  Net 457 ml    Additional Objective Labs: Basic Metabolic Panel: Recent Labs  Lab 12/12/21 0823 12/13/21 0839 12/14/21 0827  NA 136 135 135  K 4.4 4.2 4.8  CL 100 99 98  CO2 27 26 27   GLUCOSE 165* 137* 125*  BUN 27* 19 29*  CREATININE 6.88* 5.12* 6.74*  CALCIUM 9.3 9.3 9.2  PHOS 5.4* 4.8* 5.6*   Liver Function Tests: Recent Labs  Lab 12/12/21 0823 12/13/21 0839 12/14/21 0827  ALBUMIN 2.2* 2.3* 2.3*   No results for input(s): "LIPASE", "AMYLASE" in the last 168 hours. CBC: Recent Labs  Lab 12/07/21 1555 12/09/21 0254 12/10/21 2058 12/12/21 0627 12/12/21 0823 12/13/21 0839 12/14/21 0827  WBC 12.6*   < > 15.9* 16.2* 15.5* 13.9* 14.0*  NEUTROABS 8.2*  --   --   --   --   --   --   HGB 7.3*   < > 7.8* 7.8* 7.2* 7.6* 7.1*  HCT 23.2*   < > 24.9* 25.5* 24.3* 26.0* 24.3*  MCV 97.5   < > 96.1 98.5 101.7* 102.0* 102.1*  PLT 253   < > 254 308 305 271 284   < > = values in this interval not displayed.   Blood Culture    Component Value Date/Time   SDES BLOOD LEFT ANTECUBITAL 10/16/2021  1534   SPECREQUEST  10/16/2021 1534    BOTTLES DRAWN AEROBIC AND ANAEROBIC Blood Culture results may not be optimal due to an inadequate volume of blood received in culture bottles   CULT  10/16/2021 1534    NO GROWTH 5 DAYS Performed at [facility], [place] [address] [place], [place] [address]    REPTSTATUS 10/21/2021 FINAL 10/16/2021 1534    Cardiac Enzymes: No results for input(s): "CKTOTAL", "CKMB", "CKMBINDEX", "TROPONINI" in the last 168 hours. CBG: Recent Labs  Lab 12/13/21 0737 12/13/21 1133 12/13/21 1659 12/13/21 2230 12/14/21 0705  GLUCAP 83 130* 90 132* 105*   Iron Studies: No results for input(s): "IRON", "TIBC", "TRANSFERRIN", "FERRITIN" in the last 72 hours. Lab Results  Component Value Date   INR 1.3 (H) 10/09/2021   INR 1.3 (H) 09/10/2021   INR 1.3 (H) 07/10/2021   Studies/Results: No results found.  Medications:   aspirin EC  81 mg Oral Daily   atorvastatin  10 mg Oral Daily   Chlorhexidine Gluconate Cloth  6 each Topical Q0600   Chlorhexidine Gluconate Cloth  6  each Topical Q0600   darbepoetin (ARANESP) injection - DIALYSIS  200 mcg Intravenous Q Mon-HD   docusate sodium  100 mg Oral BID   feeding supplement  1 Container Oral TID BM   gabapentin  100 mg Oral Q1200   gabapentin  200 mg Oral QHS   heparin  5,000 Units Subcutaneous Q8H   insulin aspart  0-5 Units Subcutaneous QHS   insulin aspart  0-6 Units Subcutaneous TID WC   levothyroxine  100 mcg Oral Q0600   [START ON 12/17/2021] lubiprostone  8 mcg Oral Q Tue   And   lubiprostone  8 mcg Oral Q Sat   melatonin  3 mg Oral QHS   midodrine  10 mg Oral Q M,W,F-HD   pantoprazole  40 mg Oral BID AC   sevelamer carbonate  1.6 g Oral TID WC    Dialysis Orders: MWF - [facility]  4hrs, BFR 400, DFR 600,  AVF, EDW 146kg, 3K/ 2Ca - last hep B labs 8/21 - Heparin 2500 units IV with HD - Hectorol 50mcg IV q HD, held as of 10/30/21  Assessment/Plan: ESRD: Usual MWF schedule - poor  HD on 9/4 due to recurrent TDC issues, able to be dialyzed on 9/5. Received HD 9/7 and on HD today. Back to usual MWF schedule next week. TDC malfunction: Has required two [facility] exchanges and having issues again on 9/4, currently with tPA dwell. IR thinks TDC is positional, did not have fibrin at last exchange. Discussed the need for a long term solution, VVS consulted - plan is for RUE AVF next week (12/19/21). Heparin boluses restarted per outpatient HD orders. Hypertension/volume: BP variable, was on low dose metoprolol and midodine -> metop discontinued on 9/1. Last hoyer weight was 125kg which is down significantly from OP EDW -> lower on discharge. Chronic respiratory failure with hypoxia on cannula oxygen at home: on 5L O2 at baseline. Anemia of ESRD: Hgb 7.1- ESA given on 9/4. Transfuse PRN for Hgb <7. Secondary hyperparathyroidism: CorrCa high, VDRA on hold. Phos up slightly, follow for now. 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 Hypokalemia: Recurrent issue recently and dislikes Klor-Con tabs. Using high K HD bath - K 4.7 today. Disposition: Difficult situation. Prior to admit, patient required 6 staff members and 2 hoyer pads for transfer into HD chair as OP. Could not stay on dialysis for full treatment because of pain of multiple decubitus ulcers and PTAR unable to lift pt into ambulance for transport. SW has been unable to locate stretcher dialysis location so far. Her weight is lower from admit, asked PT about trying to hoyer to recliner for HD -> there is concern for worsening her chronic wounds - unclear path at this time. Continues to need SNF on discharge.  Tobie Poet, NP [facility] 12/14/2021,12:58 PM  LOS: 43 days

## 2021-12-14 NOTE — Progress Notes (Signed)
Received patient in bed to unit.  Alert and oriented.  Informed consent signed and in chart.   Treatment initiated: 0815 Treatment completed: 1215  Patient tolerated well.  Transported back to the room  Alert, without acute distress.  Hand-off given to patient's nurse.   Access used: Cath Access issues: No return venous, poor blood return arterial. Cathflo instilled both lumens.  Total UF removed: 3.0L Medication(s) given: Midodrine, Heparin Post HD VS: 118/43,97.9,18,100 Post HD weight: bed scale does not work.   [facility]

## 2021-12-14 NOTE — Progress Notes (Signed)
[facility] PROGRESS NOTE  Patient: Courtney Gray VOH:[id number]   PCP: Monico Blitz, MD DOB: 04/09/1970   DOA: 11/01/2021   DOS: 12/14/2021    Subjective: Reports some constipation.  No nausea no vomiting no fever no chills.  No abdominal pain.  Had some issues with the HD catheter at the very end of her HD.  Objective:   Vitals:   12/14/21 1200 12/14/21 1216 12/14/21 1308 12/14/21 1648  BP: (!) 117/51 (!) 118/43 (!) 124/42 (!) 130/40  Pulse: 74 74 85 76  Resp: 20 13 18 16   Temp:   98.5 F (36.9 C) 98.5 F (36.9 C)  TempSrc:   Oral Oral  SpO2: 100% 100% 99% 100%  Weight:      Height:        Clear to auscultation. S1-S2 present. Bowel sound present.  Assessment and plan: Active Issues Constipation. Initiating Amitiza twice a week. Monitor results.  ESRD on HD. TOC unable to find stretcher HD as of right now. Reached out to nephrology for HD with heparin.  Brief Hospital course 51 y.o. female with medical history significant of ESRD on HD TTS, recent bacteremia and hepatic abscess on long-term IV antibiotics, chronic HF PEF, PVD status post bilateral AKA, IIDM, morbid obesity, chronic sacral wound stage II, presented with increasing shortness of breath.   Patient underwent bilateral AKA recently including right AKA in May and left AKA on 7/5.  And patient was recently diagnosed with septic shock secondary to bacteremia and liver abscess, for which patient has been on cefepime and Flagyl and the last dose will be August 4.  Recently, since patient has bilateral AKA, she has had extreme difficulty to get access to outpatient HD center.  She never missed her dialysis however according to outpatient HD records, it takes at least 4 staff members and [facility] lift for patient to be seated in the dialysis chair.  Starting today, patient started to feel shortness of breath, no chest pain no swelling.  No cough no fever or chills. She is now awaiting placement for HD that takes  stretcher and is hoping to find one locally.  Vascular surgery is considering work-up for fistula creation on the right upper extremity. Orthopedic Dr. Morrie Sheldon consulted.   Principal Problem:   End stage renal disease ([place]) Active Problems:   Hypertension   Decubitus ulcer of sacral region, stage 2 (HCC)   Type 2 diabetes mellitus with hyperlipidemia (HCC)   Chronic respiratory failure with hypoxia (HCC)   Bacteremia   PVD (peripheral vascular disease) (HCC)   Class 3 obesity (HCC)   Anemia due to chronic kidney disease   Hypertensive emergency   Leukocytosis   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HCC)   Pressure injury of skin   Acute on chronic diastolic CHF (congestive heart failure) (HCC)   PAD (peripheral artery disease) (HCC)   Irritable bowel syndrome (IBS)   Amputation of left middle finger   GERD (gastroesophageal reflux disease)   Author: Berle Mull, MD [facility] 12/14/2021 4:56 PM   If 7PM-7AM, please contact night-coverage at [email or web address]

## 2021-12-14 NOTE — Plan of Care (Signed)
  Problem: Clinical Measurements: Goal: Respiratory complications will improve Outcome: Progressing Goal: Cardiovascular complication will be avoided Outcome: Progressing   Problem: Nutrition: Goal: Adequate nutrition will be maintained Outcome: Progressing   Problem: Coping: Goal: Level of anxiety will decrease Outcome: Progressing   Problem: Pain Managment: Goal: General experience of comfort will improve Outcome: Progressing   Problem: Elimination: Goal: Will not experience complications related to bowel motility Outcome: Not Progressing

## 2021-12-14 NOTE — Progress Notes (Signed)
Received order for Heparin 2500 units x 2 as needed during dialysis tx from provider. Verbal order read back and verified.

## 2021-12-14 NOTE — Progress Notes (Signed)
Report given to HD at this time.   Foster Simpson Ariv Penrod

## 2021-12-14 NOTE — Progress Notes (Signed)
Bp 135/71 called dialysis to let them know holding midodrine and let patient know.  Dialysis said will give up there.

## 2021-12-15 DIAGNOSIS — N186 End stage renal disease: Secondary | ICD-10-CM | POA: Diagnosis not present

## 2021-12-15 LAB — GLUCOSE, CAPILLARY
Glucose-Capillary: 77 mg/dL (ref 70–99)
Glucose-Capillary: 117 mg/dL — ABNORMAL HIGH (ref 70–99)
Glucose-Capillary: 133 mg/dL — ABNORMAL HIGH (ref 70–99)
Glucose-Capillary: 137 mg/dL — ABNORMAL HIGH (ref 70–99)

## 2021-12-15 LAB — CBC WITH DIFFERENTIAL/PLATELET
Abs Immature Granulocytes: 0.11 10*3/uL — ABNORMAL HIGH (ref 0.00–0.07)
Basophils Absolute: 0.1 10*3/uL (ref 0.0–0.1)
Basophils Relative: 1 %
Eosinophils Absolute: 1 10*3/uL — ABNORMAL HIGH (ref 0.0–0.5)
Eosinophils Relative: 7 %
HCT: 23.7 % — ABNORMAL LOW (ref 36.0–46.0)
Hemoglobin: 7.3 g/dL — ABNORMAL LOW (ref 12.0–15.0)
Immature Granulocytes: 1 %
Lymphocytes Relative: 13 %
Lymphs Abs: 1.9 10*3/uL (ref 0.7–4.0)
MCH: 30.5 pg (ref 26.0–34.0)
MCHC: 30.8 g/dL (ref 30.0–36.0)
MCV: 99.2 fL (ref 80.0–100.0)
Monocytes Absolute: 1.5 10*3/uL — ABNORMAL HIGH (ref 0.1–1.0)
Monocytes Relative: 11 %
Neutro Abs: 9.8 10*3/uL — ABNORMAL HIGH (ref 1.7–7.7)
Neutrophils Relative %: 67 %
Platelets: 254 10*3/uL (ref 150–400)
RBC: 2.39 MIL/uL — ABNORMAL LOW (ref 3.87–5.11)
RDW: 15.7 % — ABNORMAL HIGH (ref 11.5–15.5)
WBC: 14.4 10*3/uL — ABNORMAL HIGH (ref 4.0–10.5)
nRBC: 0 % (ref 0.0–0.2)

## 2021-12-15 LAB — RENAL FUNCTION PANEL
Albumin: 2.4 g/dL — ABNORMAL LOW (ref 3.5–5.0)
Anion gap: 13 (ref 5–15)
BUN: 32 mg/dL — ABNORMAL HIGH (ref 6–20)
CO2: 24 mmol/L (ref 22–32)
Calcium: 9.4 mg/dL (ref 8.9–10.3)
Chloride: 97 mmol/L — ABNORMAL LOW (ref 98–111)
Creatinine, Ser: 7 mg/dL — ABNORMAL HIGH (ref 0.44–1.00)
GFR, Estimated: 7 mL/min — ABNORMAL LOW (ref >60)
Glucose, Bld: 94 mg/dL (ref 70–99)
Phosphorus: 5 mg/dL — ABNORMAL HIGH (ref 2.5–4.6)
Potassium: 5 mmol/L (ref 3.5–5.1)
Sodium: 134 mmol/L — ABNORMAL LOW (ref 135–145)

## 2021-12-15 MED ORDER — LUBIPROSTONE 8 MCG PO CAPS
8.0000 ug | ORAL_CAPSULE | Freq: Once | ORAL | Status: AC
  Administered 2021-12-15: 8 ug via ORAL
  Filled 2021-12-15: qty 1

## 2021-12-15 MED ORDER — LIDOCAINE-PRILOCAINE 2.5-2.5 % EX CREA
1.0000 | TOPICAL_CREAM | CUTANEOUS | Status: DC | PRN
Start: 2021-12-15 — End: 2021-12-16

## 2021-12-15 MED ORDER — LIDOCAINE HCL (PF) 1 % IJ SOLN: 5.0000 mL | INTRAMUSCULAR | Status: DC | PRN

## 2021-12-15 MED ORDER — LUBIPROSTONE 24 MCG PO CAPS: 24.0000 ug | ORAL_CAPSULE | Freq: Once | ORAL | Status: DC

## 2021-12-15 MED ORDER — ALTEPLASE 2 MG IJ SOLR
2.0000 mg | Freq: Once | INTRAMUSCULAR | Status: AC | PRN
Start: 2021-12-15 — End: 2021-12-16
  Administered 2021-12-16: 2 mg
  Filled 2021-12-15: qty 2

## 2021-12-15 MED ORDER — PENTAFLUOROPROP-TETRAFLUOROETH EX AERO: 1.0000 | INHALATION_SPRAY | CUTANEOUS | Status: DC | PRN

## 2021-12-15 NOTE — Progress Notes (Signed)
[facility] PROGRESS NOTE  Patient: Courtney Gray WER:[id number]   PCP: Monico Blitz, MD DOB: [birth year]   DOA: 11/01/2021   DOS: 12/15/2021    Subjective: Still reports constipation but no nausea no vomiting no fever no chills.  Oral intake adequate.  Objective:   Vitals:   12/14/21 1648 12/14/21 2144 12/15/21 0540 12/15/21 0936  BP: (!) 130/40 (!) 118/53 (!) 143/65 131/82  Pulse: 76 77 81 92  Resp: 16 18 18 18   Temp: 98.5 F (36.9 C) 98.6 F (37 C) 98.3 F (36.8 C) 98.5 F (36.9 C)  TempSrc: Oral     SpO2: 100% 100% 100% 100%  Weight:      Height:        S1-S2 present. Bowel sound present. No tenderness. Clear to auscultation.  Assessment and plan: Active Issues Anemia of chronic kidney disease. Receiving ESA with HD. No active bleeding. Transfuse for hemoglobin less than 7. Patient has received a blood transfusion in the past and currently has consented to get 1 if indicated. We will check type and screen tomorrow with next CBC check.  Constipation. Based on discussion with the patient currently we will provide analgesia 1 more dose.  Brief Hospital course 51 y.o. female with medical history significant of ESRD on HD TTS, recent bacteremia and hepatic abscess on long-term IV antibiotics, chronic HF PEF, PVD status post bilateral AKA, IIDM, morbid obesity, chronic sacral wound stage II, presented with increasing shortness of breath.  Patient underwent bilateral AKA recently including right AKA in May and left AKA on 7/5.  And patient was recently diagnosed with septic shock secondary to bacteremia and liver abscess, for which patient has been on cefepime and Flagyl and the last dose will be August 4.  Since patient has bilateral AKA, she has had extreme difficulty to get access to outpatient HD center.  She never missed her dialysis however according to outpatient HD records, it takes at least 4 staff members and [facility] lift for patient to be seated in the dialysis  chair.  She is now awaiting placement for HD that takes stretcher and is hoping to find one locally.  Vascular surgery is considering work-up for fistula creation on the right upper extremity.  Underwent right upper extremity venogram on 8/30. Dr. Donzetta Matters felt pt was candidate for RUE HD access.  Awaiting patient to make a decision.  Orthopedic Dr. Morrie Sheldon consulted for follow-up on left middle finger amputation stump.  No new recommendations.   Principal Problem:   End stage renal disease ([place]) Active Problems:   Hypertension   Decubitus ulcer of sacral region, stage 2 (HCC)   Type 2 diabetes mellitus with hyperlipidemia (HCC)   Chronic respiratory failure with hypoxia (HCC)   Bacteremia   PVD (peripheral vascular disease) (HCC)   Class 3 obesity (HCC)   Anemia due to chronic kidney disease   Hypertensive emergency   Leukocytosis   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HCC)   Pressure injury of skin   Acute on chronic diastolic CHF (congestive heart failure) (HCC)   PAD (peripheral artery disease) (HCC)   Irritable bowel syndrome (IBS)   Amputation of left middle finger   GERD (gastroesophageal reflux disease)    Author: Berle Mull, MD [facility] 12/15/2021 3:26 PM   If 7PM-7AM, please contact night-coverage at [email or web address]

## 2021-12-15 NOTE — Progress Notes (Signed)
[facility] Progress Note   Subjective:    Seen and examined patient at bedside. No acute issues. Tolerated yesterday's HD with net UF 3L. Next HD 9/11 per her usual schedule.  Objective Vitals:   12/14/21 1648 12/14/21 2144 12/15/21 0540 12/15/21 0936  BP: (!) 130/40 (!) 118/53 (!) 143/65 131/82  Pulse: 76 77 81 92  Resp: 16 18 18 18   Temp: 98.5 F (36.9 C) 98.6 F (37 C) 98.3 F (36.8 C) 98.5 F (36.9 C)  TempSrc: Oral     SpO2: 100% 100% 100% 100%  Weight:      Height:       Physical Exam General: Well-appearing, obese woman, NAD. Nasal O2 in place. Heart: RRR; no murmur Lungs: CTA anteriorly Abdomen: soft Extremities: B AKA, no stump edema Dialysis Access: [facility]  Filed Weights   12/12/21 0831 12/12/21 1241  Weight: 125 kg 122.7 kg    Intake/Output Summary (Last 24 hours) at 12/15/2021 1116 Last data filed at 12/15/2021 0550 Gross per 24 hour  Intake 357 ml  Output 3 ml  Net 354 ml    Additional Objective Labs: Basic Metabolic Panel: Recent Labs  Lab 12/13/21 0839 12/14/21 0827 12/15/21 0313  NA 135 135 134*  K 4.2 4.8 5.0  CL 99 98 97*  CO2 26 27 24   GLUCOSE 137* 125* 94  BUN 19 29* 32*  CREATININE 5.12* 6.74* 7.00*  CALCIUM 9.3 9.2 9.4  PHOS 4.8* 5.6* 5.0*   Liver Function Tests: Recent Labs  Lab 12/13/21 0839 12/14/21 0827 12/15/21 0313  ALBUMIN 2.3* 2.3* 2.4*   No results for input(s): "LIPASE", "AMYLASE" in the last 168 hours. CBC: Recent Labs  Lab 12/12/21 0627 12/12/21 0823 12/13/21 0839 12/14/21 0827 12/15/21 0313  WBC 16.2* 15.5* 13.9* 14.0* 14.4*  NEUTROABS  --   --   --   --  9.8*  HGB 7.8* 7.2* 7.6* 7.1* 7.3*  HCT 25.5* 24.3* 26.0* 24.3* 23.7*  MCV 98.5 101.7* 102.0* 102.1* 99.2  PLT 308 305 271 284 254   Blood Culture    Component Value Date/Time   SDES BLOOD LEFT ANTECUBITAL 10/16/2021 1534   SPECREQUEST  10/16/2021 1534    BOTTLES DRAWN AEROBIC AND ANAEROBIC Blood Culture results may not be  optimal due to an inadequate volume of blood received in culture bottles   CULT  10/16/2021 1534    NO GROWTH 5 DAYS Performed at [facility], [place] [address], [place], [place] [address]    REPTSTATUS 10/21/2021 FINAL 10/16/2021 1534    Cardiac Enzymes: No results for input(s): "CKTOTAL", "CKMB", "CKMBINDEX", "TROPONINI" in the last 168 hours. CBG: Recent Labs  Lab 12/14/21 0705 12/14/21 1307 12/14/21 1649 12/14/21 2142 12/15/21 0722  GLUCAP 105* 97 171* 112* 77   Iron Studies: No results for input(s): "IRON", "TIBC", "TRANSFERRIN", "FERRITIN" in the last 72 hours. Lab Results  Component Value Date   INR 1.3 (H) 10/09/2021   INR 1.3 (H) 09/10/2021   INR 1.3 (H) 07/10/2021   Studies/Results: No results found.  Medications:   aspirin EC  81 mg Oral Daily   atorvastatin  10 mg Oral Daily   Chlorhexidine Gluconate Cloth  6 each Topical Q0600   Chlorhexidine Gluconate Cloth  6 each Topical Q0600   darbepoetin (ARANESP) injection - DIALYSIS  200 mcg Intravenous Q Mon-HD   docusate sodium  100 mg Oral BID   feeding supplement  1 Container Oral TID BM   gabapentin  100 mg  Oral Q1200   gabapentin  200 mg Oral QHS   heparin  5,000 Units Subcutaneous Q8H   insulin aspart  0-5 Units Subcutaneous QHS   insulin aspart  0-6 Units Subcutaneous TID WC   levothyroxine  100 mcg Oral Q0600   [START ON 12/17/2021] lubiprostone  8 mcg Oral Q Tue   And   lubiprostone  8 mcg Oral Q Sat   melatonin  3 mg Oral QHS   midodrine  10 mg Oral Q M,W,F-HD   pantoprazole  40 mg Oral BID AC   sevelamer carbonate  1.6 g Oral TID WC    Dialysis Orders: MWF - [facility]  4hrs, BFR 400, DFR 600,  AVF, EDW 146kg, 3K/ 2Ca - last hep B labs 8/21 - Heparin 2500 units IV with HD - Hectorol 79mcg IV q HD, held as of 10/30/21  Assessment/Plan: ESRD: Usual MWF schedule - poor HD on 9/4 due to recurrent TDC issues, able to be dialyzed on 9/5. Next HD 9/11 per usual schedule. TDC  malfunction: Has required two [facility] exchanges and having issues again on 9/4, currently with tPA dwell. IR thinks TDC is positional, did not have fibrin at last exchange. Discussed the need for a long term solution, VVS consulted - plan is for RUE AVF next week (12/19/21). Heparin boluses restarted yesterday per outpatient HD orders. Hypertension/volume: BP variable, was on low dose metoprolol and midodine -> metop discontinued on 9/1. Last hoyer weight was 125kg which is down significantly from OP EDW -> lower on discharge. Chronic respiratory failure with hypoxia on cannula oxygen at home: on 5L O2 at baseline. Anemia of ESRD: Hgb 7.3- ESA given on 9/4. Transfuse PRN for Hgb <7. Secondary hyperparathyroidism: CorrCa high, VDRA on hold. Phos up slightly, follow for now. 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 Hypokalemia: Recurrent issue recently and dislikes Klor-Con tabs. Using high K HD bath - K 5.0 today. Disposition: Difficult situation. Prior to admit, patient required 6 staff members and 2 hoyer pads for transfer into HD chair as OP. Could not stay on dialysis for full treatment because of pain of multiple decubitus ulcers and PTAR unable to lift pt into ambulance for transport. SW has been unable to locate stretcher dialysis location so far. Her weight is lower from admit, asked PT about trying to hoyer to recliner for HD -> there is concern for worsening her chronic wounds - unclear path at this time. Continues to need SNF on discharge.  Tobie Poet, NP [facility] 12/15/2021,11:16 AM  LOS: 44 days

## 2021-12-15 NOTE — Progress Notes (Signed)
Dressing change performed along with bed bath

## 2021-12-16 LAB — CBC
HCT: 23.8 % — ABNORMAL LOW (ref 36.0–46.0)
Hemoglobin: 7.2 g/dL — ABNORMAL LOW (ref 12.0–15.0)
MCH: 30.1 pg (ref 26.0–34.0)
MCHC: 30.3 g/dL (ref 30.0–36.0)
MCV: 99.6 fL (ref 80.0–100.0)
Platelets: 282 10*3/uL (ref 150–400)
RBC: 2.39 MIL/uL — ABNORMAL LOW (ref 3.87–5.11)
RDW: 15.4 % (ref 11.5–15.5)
WBC: 12.7 10*3/uL — ABNORMAL HIGH (ref 4.0–10.5)
nRBC: 0 % (ref 0.0–0.2)

## 2021-12-16 LAB — RENAL FUNCTION PANEL
Albumin: 2.4 g/dL — ABNORMAL LOW (ref 3.5–5.0)
Anion gap: 13 (ref 5–15)
BUN: 47 mg/dL — ABNORMAL HIGH (ref 6–20)
CO2: 24 mmol/L (ref 22–32)
Calcium: 9.3 mg/dL (ref 8.9–10.3)
Chloride: 98 mmol/L (ref 98–111)
Creatinine, Ser: 9.51 mg/dL — ABNORMAL HIGH (ref 0.44–1.00)
GFR, Estimated: 5 mL/min — ABNORMAL LOW (ref >60)
Glucose, Bld: 106 mg/dL — ABNORMAL HIGH (ref 70–99)
Phosphorus: 6.4 mg/dL — ABNORMAL HIGH (ref 2.5–4.6)
Potassium: 5.5 mmol/L — ABNORMAL HIGH (ref 3.5–5.1)
Sodium: 135 mmol/L (ref 135–145)

## 2021-12-16 LAB — GLUCOSE, CAPILLARY
Glucose-Capillary: 88 mg/dL (ref 70–99)
Glucose-Capillary: 140 mg/dL — ABNORMAL HIGH (ref 70–99)
Glucose-Capillary: 273 mg/dL — ABNORMAL HIGH (ref 70–99)
Glucose-Capillary: 102 mg/dL — ABNORMAL HIGH (ref 70–99)

## 2021-12-16 LAB — TYPE AND SCREEN
ABO/RH(D): O POS
Antibody Screen: NEGATIVE

## 2021-12-16 NOTE — Plan of Care (Signed)
  Problem: Skin Integrity: Goal: Risk for impaired skin integrity will decrease Outcome: Progressing   Problem: Education: Goal: Knowledge of General Education information will improve Description: Including pain rating scale, medication(s)/side effects and non-pharmacologic comfort measures Outcome: Progressing   Problem: Health Behavior/Discharge Planning: Goal: Ability to manage health-related needs will improve Outcome: Progressing   Problem: Clinical Measurements: Goal: Ability to maintain clinical measurements within normal limits will improve Outcome: Progressing Goal: Will remain free from infection Outcome: Progressing Goal: Diagnostic test results will improve Outcome: Progressing Goal: Respiratory complications will improve Outcome: Progressing Goal: Cardiovascular complication will be avoided Outcome: Progressing   Problem: Activity: Goal: Risk for activity intolerance will decrease Outcome: Progressing   Problem: Nutrition: Goal: Adequate nutrition will be maintained Outcome: Progressing   Problem: Coping: Goal: Level of anxiety will decrease Outcome: Progressing   Problem: Elimination: Goal: Will not experience complications related to bowel motility Outcome: Progressing Goal: Will not experience complications related to urinary retention Outcome: Progressing   Problem: Pain Managment: Goal: General experience of comfort will improve Outcome: Progressing   Problem: Safety: Goal: Ability to remain free from injury will improve Outcome: Progressing   Problem: Skin Integrity: Goal: Risk for impaired skin integrity will decrease Outcome: Progressing

## 2021-12-16 NOTE — Significant Event (Signed)
Rapid Response Event Note   Reason for Call : Hypotension  Initial Focused Assessment:  Notified by HD nursing staff of pt with hypotension at the end of HD treatment. SBP 50-70s reportedly. Pt alert. C/o of some SOB. No CP. BBS Clear and diminished bases. HD gave 450cc NS back at end of treatment. CBG 273. BP now 96/43. HR 76.      Interventions:  -NO interventions per RRRN    MD Notified: Dr. Nevada Crane  Call Time:2027 Arrival Time: 2030 End Time:2055  Madelynn Done, RN

## 2021-12-16 NOTE — Progress Notes (Signed)
[facility] Progress Note   Subjective:   Seen in room. No CP/dyspnea. For HD later today.  Objective Vitals:   12/15/21 1739 12/15/21 2057 12/16/21 0541 12/16/21 0748  BP: (!) 149/48 (!) 155/45 (!) 142/76 (!) 143/62  Pulse: 80 88 77 79  Resp: 18 19 18 17   Temp: 98.2 F (36.8 C) 98.3 F (36.8 C) 98.4 F (36.9 C) 98.2 F (36.8 C)  TempSrc:      SpO2: 100% 100% 100% 100%  Weight:      Height:       Physical Exam General: Well-appearing, obese woman, NAD. Nasal O2 in place. Heart: RRR; no murmur Lungs: CTA anteriorly Abdomen: soft Extremities: B AKA, no stump edema Dialysis Access: [facility]  Additional Objective Labs: Basic Metabolic Panel: Recent Labs  Lab 12/13/21 0839 12/14/21 0827 12/15/21 0313  NA 135 135 134*  K 4.2 4.8 5.0  CL 99 98 97*  CO2 26 27 24   GLUCOSE 137* 125* 94  BUN 19 29* 32*  CREATININE 5.12* 6.74* 7.00*  CALCIUM 9.3 9.2 9.4  PHOS 4.8* 5.6* 5.0*   Liver Function Tests: Recent Labs  Lab 12/13/21 0839 12/14/21 0827 12/15/21 0313  ALBUMIN 2.3* 2.3* 2.4*   CBC: Recent Labs  Lab 12/12/21 0627 12/12/21 0823 12/13/21 0839 12/14/21 0827 12/15/21 0313  WBC 16.2* 15.5* 13.9* 14.0* 14.4*  NEUTROABS  --   --   --   --  9.8*  HGB 7.8* 7.2* 7.6* 7.1* 7.3*  HCT 25.5* 24.3* 26.0* 24.3* 23.7*  MCV 98.5 101.7* 102.0* 102.1* 99.2  PLT 308 305 271 284 254   Medications:   aspirin EC  81 mg Oral Daily   atorvastatin  10 mg Oral Daily   darbepoetin (ARANESP) injection - DIALYSIS  200 mcg Intravenous Q Mon-HD   docusate sodium  100 mg Oral BID   feeding supplement  1 Container Oral TID BM   gabapentin  100 mg Oral Q1200   gabapentin  200 mg Oral QHS   heparin  5,000 Units Subcutaneous Q8H   insulin aspart  0-5 Units Subcutaneous QHS   insulin aspart  0-6 Units Subcutaneous TID WC   levothyroxine  100 mcg Oral Q0600   [START ON 12/17/2021] lubiprostone  8 mcg Oral Q Tue   And   lubiprostone  8 mcg Oral Q Sat   melatonin  3 mg  Oral QHS   midodrine  10 mg Oral Q M,W,F-HD   pantoprazole  40 mg Oral BID AC   sevelamer carbonate  1.6 g Oral TID WC    Dialysis Orders: MWF - [facility]  4hrs, BFR 400, DFR 600,  AVF, EDW 146kg, 3K/ 2Ca - last hep B labs 8/21 - Heparin 2500 units IV with HD - Hectorol 71mcg IV q HD, held as of 10/30/21   Assessment/Plan: ESRD: Was off schedule last week d/t TDC issues, back to MWF schedule this week - HD today. TDC malfunction: Had required two [facility] exchanges and having issues again on 9/4, got overnight tPA dwell. IR thinks TDC is positional as no fibrin on tip at last exchange. Discussed the need for a long term solution, VVS consulted - plan is for RUE AVF this week (12/19/21). Heparin boluses restarted yesterday per outpatient HD orders. Hypertension/volume: BP variable, was on low dose metoprolol and midodine -> metop discontinued on 9/1. Last hoyer weight was 125kg which is down significantly from OP EDW -> lower on discharge. Chronic respiratory failure with hypoxia on  cannula oxygen at home: on 5L O2 at baseline. Anemia of ESRD: Hgb 7.3 - continue high dose ESA q Monday while here.Transfuse PRN for Hgb <7. Secondary hyperparathyroidism: CorrCa high, VDRA on hold. Phos ok, continue Renvela as binder. 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 Hypokalemia: Recurrent issue recently and dislikes Klor-Con tabs. Using high K HD bath - K 5.0 today. Disposition: Difficult situation. Prior to admit, patient required 6 staff members and 2 hoyer pads for transfer into HD chair as OP. Could not stay on dialysis for full treatment because of pain of multiple decubitus ulcers and PTAR unable to lift pt into ambulance for transport. SW has been unable to locate stretcher dialysis location so far. Her weight is much lower from admit, asked PT about trying to hoyer to recliner for HD -> apparently would need hoyer that wraps around her legs and there is concern for  worsening her chronic wounds - unclear path at this time. Continues to need SNF on discharge.  [name], PA-C 12/16/2021, 9:45 AM  [employer]

## 2021-12-16 NOTE — Progress Notes (Signed)
OT Cancellation Note  Patient Details Name: Courtney Gray MRN: [id number] DOB: [birth year]   Cancelled Treatment:    Reason Eval/Treat Not Completed: Patient at procedure or test/ unavailable (Pt preparing to go to HD, awaiting dressing change.)  [name] 12/16/2021, 12:26 PM Cleta Alberts, OTR/L [place] Office: [phone]

## 2021-12-16 NOTE — Progress Notes (Signed)
PT Cancellation Note  Patient Details Name: Courtney Gray MRN: [id number] DOB: 10-27-1970   Cancelled Treatment:    Reason Eval/Treat Not Completed: Other (comment)  Attempted to see pt twice this morning (900 and 1015) with pt asleep both times and asking PT to come after she gets her breakfast. Assisted pt to call re: her breakfast since it had not yet arrived. Will attempt to see later today as schedule permits and pt agreeable.    Arby Barrette, PT Acute Rehabilitation Services  Office [phone]  [name] 12/16/2021, 10:21 AM

## 2021-12-16 NOTE — Progress Notes (Signed)
[facility] PROGRESS NOTE  Patient: Courtney Gray FMB:[id number]   PCP: Monico Blitz, MD DOB: [birth year]   DOA: 11/01/2021   DOS: 12/16/2021    Subjective: No nausea no vomiting no fever no chills  has some itching on her right upper eyelid.  Constipation resolved.  Objective:   Vitals:   12/16/21 1700 12/16/21 1800 12/16/21 1830 12/16/21 1842  BP: 131/60 (!) 128/40 (!) 113/47 (!) 121/44  Pulse: 78     Resp: (!) 23 17 (!) 27 16  Temp:      TempSrc:      SpO2: 98%     Weight:      Height:        S1-S2 present. Bowel sound present.  Assessment and plan: Active Issues Right upper eyelid itching. For now we will monitor. If does not improve by tomorrow initiate topical antibiotic therapy.  Constipation. Continue Amitiza 2 times per week.  Brief Hospital course 51 y.o. female with medical history significant of ESRD on HD TTS, recent bacteremia and hepatic abscess on long-term IV antibiotics, chronic HF PEF, PVD status post bilateral AKA, IIDM, morbid obesity, chronic sacral wound stage II, presented with increasing shortness of breath.  Patient underwent bilateral AKA recently including right AKA in May and left AKA on 7/5.  And patient was recently diagnosed with septic shock secondary to bacteremia and liver abscess, for which patient has been on cefepime and Flagyl and the last dose will be August 4.  Since patient has bilateral AKA, she has had extreme difficulty to get access to outpatient HD center.  She never missed her dialysis however according to outpatient HD records, it takes at least 4 staff members and [facility] lift for patient to be seated in the dialysis chair.  She is now awaiting placement for HD that takes stretcher and is hoping to find one locally.  Vascular surgery is considering work-up for fistula creation on the right upper extremity.  Underwent right upper extremity venogram on 8/30. Dr. Donzetta Matters felt pt was candidate for RUE HD access.  Awaiting patient to  make a decision.  Orthopedic Dr. Morrie Sheldon consulted for follow-up on left middle finger amputation stump.  No new recommendations.   Principal Problem:   End stage renal disease ([place]) Active Problems:   Hypertension   Decubitus ulcer of sacral region, stage 2 (HCC)   Type 2 diabetes mellitus with hyperlipidemia (HCC)   Chronic respiratory failure with hypoxia (HCC)   Bacteremia   PVD (peripheral vascular disease) (HCC)   Class 3 obesity (HCC)   Anemia due to chronic kidney disease   Hypertensive emergency   Leukocytosis   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HCC)   Pressure injury of skin   Acute on chronic diastolic CHF (congestive heart failure) (HCC)   PAD (peripheral artery disease) (HCC)   Irritable bowel syndrome (IBS)   Amputation of left middle finger   GERD (gastroesophageal reflux disease)    Author: Berle Mull, MD [facility] 12/16/2021 7:30 PM   If 7PM-7AM, please contact night-coverage at [email or web address]

## 2021-12-16 NOTE — Progress Notes (Signed)
Physical Therapy Treatment Patient Details Name: Courtney Gray MRN: [id number] DOB: 1970-05-01 Today's Date: 12/16/2021   History of Present Illness Pt adm 7/28 with acute pulmonary edema. 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    PT Comments    Patient initially seen to assist her with scooting up in bed for her to be able to reach and eat her breakfast. On return, she had been told dialysis was coming for her within the hour, therefore deferred plan for OOB to wheelchair. Worked on bed mobility and exchanging soiled pad. Noted wound exposed due to tape coming off inferior border. Patient requested defer remainder of session so RN will have time to come and fix her dressing before going to dialysis. RN made aware.     Recommendations for follow up therapy are one component of a multi-disciplinary discharge planning process, led by the attending physician.  Recommendations may be updated based on patient status, additional functional criteria and insurance authorization.  Follow Up Recommendations  Skilled nursing-short term rehab (<3 hours/day) Can patient physically be transported by private vehicle: No   Assistance Recommended at Discharge Frequent or constant Supervision/Assistance  Patient can return home with the following Two people to help with walking and/or transfers;Two people to help with bathing/dressing/bathroom;Assist for transportation;Assistance with cooking/housework;Help with stairs or ramp for entrance   Equipment Recommendations  Wheelchair (measurements PT);Wheelchair cushion (measurements PT);Other (comment);Hospital bed    Recommendations for Other Services       Precautions / Restrictions Precautions Precautions: Fall Precaution Comments: bil AKA, 3L O2 at baseline, LUQ drain Restrictions Weight Bearing Restrictions: Yes RLE Weight Bearing: Non weight bearing LLE  Weight Bearing: Non weight bearing Other Position/Activity Restrictions: minimized pushing/pulling and weight through L hand, dressing now removed     Mobility  Bed Mobility Overal bed mobility: Needs Assistance Bed Mobility: Rolling Rolling: Min assist         General bed mobility comments: seen earlier for scoot up to [facility] for breakfast (bed in trendelenburg with pt using top of head board to pull herself up with min assist); rolling to each side x 2 for pad positioning/changing; noted wound packing in bed and inferior margin of dressing not taped; RN made aware as pt wants it changed before goes to HD; bed in trendelenburg and pt able to pull herself up to [facility] with min assist    Transfers                        Ambulation/Gait               General Gait Details: stated she could not work on "butt/hip walk" due to pain in rt ischium   [name], [name] (indicate cue type and reason): HD coming within the hour to take her to HD, therefore deferred  Modified Rankin (Stroke Patients Only)       Balance                                            Cognition Arousal/Alertness: Awake/alert Behavior During Therapy: WFL for tasks assessed/performed Overall Cognitive Status: Within Functional Limits for tasks assessed  Exercises      General Comments        Pertinent Vitals/Pain Pain Assessment Pain Assessment: Faces Faces Pain Scale: Hurts little more Pain Location: R-ischial wound Pain Descriptors / Indicators: Discomfort, Guarding, Grimacing, Moaning, Sore, Sharp, Throbbing Pain Intervention(s): Limited activity within patient's tolerance, Monitored during session, Repositioned    Home Living                          Prior Function            PT Goals (current goals can now be  found in the care plan section) Acute Rehab PT Goals Patient Stated Goal: ultimately wants to be modified independent at w/c level Time For Goal Achievement: 12/24/21 Potential to Achieve Goals: Fair Progress towards PT goals: Progressing toward goals    Frequency    Min 3X/week      PT Plan Discharge plan needs to be updated    Co-evaluation              AM-PAC PT "6 Clicks" Mobility   Outcome Measure  Help needed turning from your back to your side while in a flat bed without using bedrails?: A Little Help needed moving from lying on your back to sitting on the side of a flat bed without using bedrails?: A Lot Help needed moving to and from a bed to a chair (including a wheelchair)?: Total Help needed standing up from a chair using your arms (e.g., wheelchair or bedside chair)?: Total Help needed to walk in hospital room?: Total Help needed climbing 3-5 steps with a railing? : Total 6 Click Score: 9    End of Session Equipment Utilized During Treatment: Oxygen Activity Tolerance: Treatment limited secondary to medical complications (Comment) (wound dressing coming off with wound exposed) Patient left: in bed;with call bell/phone within reach Nurse Communication: Other (comment) (needs new dressing to rt ischium) PT Visit Diagnosis: Other abnormalities of gait and mobility (R26.89);Muscle weakness (generalized) (M62.81) Pain - Right/Left: Right Pain - part of body: Leg     Time: 3500-9381 PT Time Calculation (min) (ACUTE ONLY): 31 min  Charges:  $Therapeutic Activity: 23-37 mins                      Arby Barrette, PT Acute Rehabilitation Services  Office [phone]    Courtney Gray 12/16/2021, 12:23 PM

## 2021-12-16 NOTE — Progress Notes (Signed)
   12/16/21 2030  Vitals  Temp 98.2 F (36.8 C)  BP (!) 110/58  Pulse Rate 80  Resp 16  Post Treatment  Dialyzer Clearance Heavily streaked  Duration of HD Treatment -hour(s) 2.9 hour(s)  Hemodialysis Intake (mL) 4.5 mL  Fluid Removed 2.2  Tolerated HD Treatment No (Comment) (Had to change lines, due to TMP alarm, and had a low bp that would not respond to bolus attempts. Called reppid responce.)  Hemodialysis Catheter Left Internal jugular Double lumen Permanent (Tunneled)  Placement Date/Time: 11/28/21 1222   Serial / Lot #: [id number]  Expiration Date: 03/17/26  Time Out: Correct patient;Correct site;Correct procedure  Maximum sterile barrier precautions: Cap;Mask;Sterile gown;Large sterile sheet;Sterile gloves  Site P...  Site Condition No complications  Blue Lumen Status Flushed  Red Lumen Status Flushed  Purple Lumen Status N/A  Catheter fill solution Other (Comment) (TPA)  Catheter fill volume (Arterial) 1.6 cc  Catheter fill volume (Venous) 1.6  Dressing Type Transparent  Dressing Status Clean, Dry, Intact  Post treatment catheter status Capped and Clamped   Changed PT lines due to clotting in filter w/ TMP Alarms. Repo PT,flushed, and reversed lines. Stopped TX when had to call rapid response for unresponsive BP. Gave back 4.5 in total NS. Pressures went 78/52, 52/31, then up slowly. Rapid call the primary, no call back. The PT normalized with the 4.5 back and HOB down. BS was 273.Cath was closed w/ TPA and reccom.  to be repo. Or exchanged.

## 2021-12-17 DIAGNOSIS — N186 End stage renal disease: Secondary | ICD-10-CM | POA: Diagnosis not present

## 2021-12-17 LAB — GLUCOSE, CAPILLARY
Glucose-Capillary: 109 mg/dL — ABNORMAL HIGH (ref 70–99)
Glucose-Capillary: 102 mg/dL — ABNORMAL HIGH (ref 70–99)
Glucose-Capillary: 142 mg/dL — ABNORMAL HIGH (ref 70–99)

## 2021-12-17 MED ORDER — PANTOPRAZOLE SODIUM 40 MG PO TBEC
40.0000 mg | DELAYED_RELEASE_TABLET | Freq: Every day | ORAL | Status: DC
  Administered 2021-12-17 – 2022-05-15 (×141): 40 mg via ORAL
  Filled 2021-12-17 (×147): qty 1

## 2021-12-17 MED ORDER — INSULIN ASPART 100 UNIT/ML IJ SOLN: 0.0000 [IU] | Freq: Three times a day (TID) | INTRAMUSCULAR | Status: DC

## 2021-12-17 MED ORDER — ACETAMINOPHEN 500 MG PO TABS
1000.0000 mg | ORAL_TABLET | Freq: Once | ORAL | Status: AC
  Administered 2021-12-17: 1000 mg via ORAL
  Filled 2021-12-17: qty 2

## 2021-12-17 NOTE — Progress Notes (Signed)
Progress Note Patient: Courtney Gray BOF:[id number] DOB: [birth year] DOA: 11/01/2021  DOS: the patient was seen and examined on 12/17/2021  Brief hospital course: 51 y.o. female with medical history significant of ESRD on HD TTS, recent bacteremia and hepatic abscess on long-term IV antibiotics, chronic HF PEF, PVD status post bilateral AKA, IIDM, morbid obesity, chronic sacral wound stage II, presented with increasing shortness of breath.  Patient underwent bilateral AKA recently including right AKA in May and left AKA on 7/5.  And patient was recently diagnosed with septic shock secondary to bacteremia and liver abscess, for which patient has been on cefepime and Flagyl and the last dose will be August 4.  Since patient has bilateral AKA, she has had extreme difficulty to get access to outpatient HD center.  She never missed her dialysis however according to outpatient HD records, it takes at least 4 staff members and [facility] lift for patient to be seated in the dialysis chair.  She is now awaiting placement for HD that takes stretcher and is hoping to find one locally.  Vascular surgery is considering work-up for fistula creation on the right upper extremity.  Underwent right upper extremity venogram on 8/30. Dr. Donzetta Matters felt pt was candidate for RUE HD access.  Awaiting patient to make a decision.  Orthopedic Dr. Morrie Sheldon consulted for follow-up on left middle finger amputation stump.  No new recommendations.  Assessment and Plan: ESRD on HD Nephrology consulted and managing hemodialysis. Patient noted during HD treatment to have catheter issues and hemodialysis stopped on 11/25/2021.  IR consulted underwent catheter exchange on 8/22. Required PTA of chronic fibrin sheath of left brachiocephalic and SVC 7/78. HD catheter nonfunctioning again on 8/25.  Management per IR and nephrology. Need to find a place where she can get HD in stretcher. TOC following and helping with placement.  Acute on  chronic diastolic CHF with chronic hypoxemic respiratory failure Acute issues resolved. Secondary to uncontrolled hypertension, patient with signs of mild volume overload on presentation. Volume management per HD/nephrology.   Hypertensive emergency On admission blood pressure 183/53. Was on Imdur, metoprolol. Currently blood pressure is soft on midodrine.  Hypotension. Consistent with HD. On midodrine.   Anemia of CKD IV iron weekly per nephrology. H&H stable.   Well-controlled insulin-dependent diabetes mellitus Hemoglobin A1c 5.7 SSI.   Recent septic shock secondary to Bacteroides, bacteremia and liver, and splenic abscess It is noted that on recent admission, patient developed septic shock from bacteroides with septic emboli to spleen, initially on anticoagulation which was later held as splenic infarct was determined to be infectious and not a blood clot. Patient receiving IV cefepime on HD and Flagyl which was completed 8/4. Consulted IR to evaluate for abdominal drain that was in place IR seen and drain removed 8/9. Patient noted to have missed ID clinic appointment on 11/01/2021. Patient seen by ID no indication for further antibiotics at this time, IR to remove drain per ID, ID has since signed off. Abdominal discomfort improved post drain removal.  Leukocytosis. Chronically elevated. Monitor.   PAD Status post bilateral AKA for gangrene with plan to follow-up with vascular surgery in a month (Dr. Stanford Breed on 11/21/2021 for staple removal) Patient is status post left middle finger amputation for gangrene during recent admission. Vascular surgery following. Continue aspirin.   Hyperlipidemia Statin.   Chronic respiratory failure with hypoxia Patient noted to report uses 4 L/Min home O2 prior to admission. On 2 LPM which is her chronic oxygen regimen.  Morbid obesity. Body mass index is 45.01 kg/m.  Placing the pt at higher risk of poor outcomes. Needing Hoyer  lift for transfers.   Right hip unstageable pressure injury. Left flank stage II pressure ulcer. Both present on admission. Continue wound care recommendation.  LALM and Medihoney. Significant improvement in ulcer seen on 8/27.    Hypomagnesemia. Replaced.   Leukocytosis. Etiology not clear. For now we will monitor.  Likely stress reaction.  Irritable bowel syndrome with constipation. Intractable nausea and vomiting Initiate Amitiza.  Recent amputation of the left middle finger. Surgery was in May 2023. Appears to have some poor healing with eschar creation. Orthopedic/hand surgery consulted to look at the wound again. Orthopedic was consulted-currently per patient recommendation is for observation.  Consideration for AV fistula. Vascular surgery consulted. Left upper extremity catheter limited due to recent amputation surgery with poor healing as well as distal radius stenosis. vascular surgery currently awaiting patient's decision with regards to fistula placement.  Subjective: No acute no nausea no vomiting.  Blood pressure was soft in HD yesterday.  Physical Exam: Vitals:   12/16/21 2204 12/17/21 0519 12/17/21 0830 12/17/21 1200  BP: 129/66 135/65 (!) 88/44 (!) 121/43  Pulse: 83 85 78 80  Resp: 16 17 14    Temp: 99 F (37.2 C) 98.3 F (36.8 C) 98 F (36.7 C) 97.8 F (36.6 C)  TempSrc: Oral Axillary Oral Oral  SpO2: 100% 100% 100% 100%  Weight:      Height:       General: Appear in mild distress; no visible Abnormal Neck Mass Or lumps, Conjunctiva normal Cardiovascular: S1 and S2 Present, aortic systolic Murmur, Respiratory: good respiratory effort, Bilateral Air entry present and CTA, no Crackles, no wheezes Abdomen: Bowel Sound present, Non tender Extremities: Bilateral AKA Neurology: alert and oriented to Self, Place and time.   Data Reviewed: I have Reviewed nursing notes, Vitals, and Lab results since pt's last encounter. Pertinent lab results stable CBG      Family Communication: No one at bedside  Disposition: Status is: Inpatient Remains inpatient appropriate because: Need for placement for HD  Author: Berle Mull, MD 12/17/2021 8:09 PM  Please look on [email or web address] to find out who is on call.

## 2021-12-17 NOTE — Progress Notes (Signed)
Mobility Specialist Progress Note:   12/17/21 1225  Mobility  Activity Turned to left side;Turned to right side  Level of Assistance +2 (takes two people)  Activity Response Tolerated well  $Mobility charge 1 Mobility   Pt requiring +2 assistance to roll to place pads post-OT session. Left with all needs met.   Nelta Numbers Acute Rehab Secure Chat or Office Phone: [phone]

## 2021-12-17 NOTE — Progress Notes (Signed)
[facility] Progress Note   Subjective:  BP dropped significantly with HD yesterday. Low sided again today when checked on wrist cuff, better with larger upper arm cuff - asymptomatic this AM. No CP/dyspnea.  Objective Vitals:   12/16/21 2036 12/16/21 2204 12/17/21 0519 12/17/21 0830  BP: (!) 96/43 129/66 135/65 (!) 88/44  Pulse:  83 85 78  Resp: 18 16 17 14   Temp:  99 F (37.2 C) 98.3 F (36.8 C) 98 F (36.7 C)  TempSrc:  Oral Axillary Oral  SpO2:  100% 100% 100%  Weight:      Height:       Physical Exam General: Well-appearing, obese woman, NAD. Nasal O2 in place. Heart: RRR; no murmur Lungs: CTA anteriorly Abdomen: soft Extremities: B AKA, no stump edema Dialysis Access: [facility]  Additional Objective Labs: Basic Metabolic Panel: Recent Labs  Lab 12/14/21 0827 12/15/21 0313 12/16/21 1554  NA 135 134* 135  K 4.8 5.0 5.5*  CL 98 97* 98  CO2 27 24 24   GLUCOSE 125* 94 106*  BUN 29* 32* 47*  CREATININE 6.74* 7.00* 9.51*  CALCIUM 9.2 9.4 9.3  PHOS 5.6* 5.0* 6.4*   Liver Function Tests: Recent Labs  Lab 12/14/21 0827 12/15/21 0313 12/16/21 1554  ALBUMIN 2.3* 2.4* 2.4*   CBC: Recent Labs  Lab 12/12/21 0823 12/13/21 0839 12/14/21 0827 12/15/21 0313 12/16/21 1554  WBC 15.5* 13.9* 14.0* 14.4* 12.7*  NEUTROABS  --   --   --  9.8*  --   HGB 7.2* 7.6* 7.1* 7.3* 7.2*  HCT 24.3* 26.0* 24.3* 23.7* 23.8*  MCV 101.7* 102.0* 102.1* 99.2 99.6  PLT 305 271 284 254 282   Medications:   aspirin EC  81 mg Oral Daily   atorvastatin  10 mg Oral Daily   darbepoetin (ARANESP) injection - DIALYSIS  200 mcg Intravenous Q Mon-HD   docusate sodium  100 mg Oral BID   feeding supplement  1 Container Oral TID BM   gabapentin  100 mg Oral Q1200   gabapentin  200 mg Oral QHS   heparin  5,000 Units Subcutaneous Q8H   insulin aspart  0-6 Units Subcutaneous TID WC   levothyroxine  100 mcg Oral Q0600   lubiprostone  8 mcg Oral Q Tue   And   lubiprostone  8 mcg  Oral Q Sat   midodrine  10 mg Oral Q M,W,F-HD   pantoprazole  40 mg Oral Daily   sevelamer carbonate  1.6 g Oral TID WC    Dialysis Orders: MWF - [facility]  4hrs, BFR 400, DFR 600,  AVF, EDW 146kg, 3K/ 2Ca - last hep B labs 8/21 - Heparin 2500 units IV with HD - Hectorol 39mcg IV q HD, held as of 10/30/21   Assessment/Plan: ESRD: Was off schedule last week d/t [facility] issues, back to MWF schedule this week - next HD tomorrow (9/13). TDC malfunction: Had required two [facility] exchanges and having issues again on 9/4, got overnight tPA dwell. IR thinks TDC is positional as no fibrin on tip at last exchange. Discussed the need for a long term solution, VVS consulted - plan is for RUE AVF this week (12/19/21). Heparin boluses restarted yesterday per outpatient HD orders. BP/volume: BP variable, was on low dose metoprolol and midodine -> metop discontinued on 9/1. Last hoyer weight was 125kg which is down significantly from OP EDW -> lower on discharge. BP dropped pretty significantly with HD yesterday - midodrine had been given ~11am,  didn't get dialysis until much later in the evening which is likely the issue. Will discuss the dosing with dialysis team, may sure that is given right before HD. If becomes a recurrent issue, will adjust midodrine to daily dosing. Chronic respiratory failure with hypoxia on cannula oxygen at home: on 5L O2 at baseline. Anemia of ESRD: Hgb 7.2 - continue high dose ESA q Monday while here.Transfuse PRN for Hgb <7. Secondary hyperparathyroidism: CorrCa high, VDRA on hold. Phos high, continue Renvela as binder but ^ dose. 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 Hypokalemia: Recurrent issue recently and dislikes Klor-Con tabs. Was using high K bath with HD -> back to usual. Severe sacral decubitus  wound: Per primary/wound care team. Disposition: Difficult situation. Prior to admit, patient required 6 staff members and 2 hoyer pads for transfer  into HD chair as OP. Could not stay on dialysis for full treatment because of pain of multiple decubitus ulcers and PTAR unable to lift pt into ambulance for transport. SW has been unable to locate stretcher dialysis location so far. Her weight is much lower from admit, asked PT about trying to hoyer to recliner for HD -> apparently would need hoyer that wraps around her legs and there is concern for worsening her chronic wounds - unclear path at this time. Continues to need SNF on discharge.  [name], PA-C 12/17/2021, 10:58 AM  [employer]

## 2021-12-17 NOTE — Progress Notes (Signed)
Occupational Therapy Treatment Patient Details Name: Courtney Gray MRN: [id number] DOB: Aug 14, 1970 Today's Date: 12/17/2021   History of present illness Pt adm 7/28 with acute pulmonary edema. 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   OT comments  Patient received in supine and declined getting out of bed but agreed to address self care. Patient performed bathing and dressing at bed level with assistance of 2 due to need of female to assist with LB bathing. Patient rolled side to side to assist with bathing back and bottom and assist with UB bathing. Patient to continue to be followed by acute OT.    Recommendations for follow up therapy are one component of a multi-disciplinary discharge planning process, led by the attending physician.  Recommendations may be updated based on patient status, additional functional criteria and insurance authorization.    Follow Up Recommendations  Skilled nursing-short term rehab (<3 hours/day)    Assistance Recommended at Discharge Intermittent Supervision/Assistance  Patient can return home with the following  Two people to help with walking and/or transfers;Two people to help with bathing/dressing/bathroom;Assistance with cooking/housework;Direct supervision/assist for medications management;Assist for transportation;Help with stairs or ramp for entrance   Equipment Recommendations  Other (comment) (defer to next venue)    Recommendations for Other Services      Precautions / Restrictions Precautions Precautions: Fall Precaution Comments: bil AKA, 3L O2 at baseline, LUQ drain Restrictions Weight Bearing Restrictions: Yes RLE Weight Bearing: Non weight bearing LLE Weight Bearing: Non weight bearing Other Position/Activity Restrictions: minimized pushing/pulling and weight through L hand, dressing now removed       Mobility Bed Mobility Overal bed  mobility: Needs Assistance Bed Mobility: Rolling Rolling: Min assist   Supine to sit: HOB elevated, Min guard     General bed mobility comments: min assist to roll on right side due to wound    Transfers Overall transfer level: Needs assistance                 General transfer comment: declined getting OOB     Balance Overall balance assessment: Needs assistance Sitting-balance support: No upper extremity supported Sitting balance-[name] Scale: Fair Sitting balance - Comments: long-sitting in bed                                   ADL either performed or assessed with clinical judgement   ADL Overall ADL's : Needs assistance/impaired     Grooming: Wash/dry hands;Wash/dry face;Set up;Bed level   Upper Body Bathing: Moderate assistance;Bed level Upper Body Bathing Details (indicate cue type and reason): assistance for back and rolling Lower Body Bathing: Maximal assistance;Bed level Lower Body Bathing Details (indicate cue type and reason): assistance with rolling and max assist for cleaning Upper Body Dressing : Minimal assistance;Bed level Upper Body Dressing Details (indicate cue type and reason): changed gown                   General ADL Comments: assistance of 2 for LB bathing due to asked for female to assist    Extremity/Trunk Assessment              Vision       Perception     Praxis      Cognition Arousal/Alertness: Awake/alert Behavior During Therapy: WFL for tasks assessed/performed Overall Cognitive Status: Within Functional Limits  for tasks assessed                                 General Comments: declined getting OOB        Exercises      Shoulder Instructions       General Comments      Pertinent Vitals/ Pain       Pain Assessment Pain Assessment: Faces Faces Pain Scale: Hurts little more Pain Location: R-ischial wound Pain Descriptors / Indicators: Discomfort, Grimacing, Guarding,  Sore Pain Intervention(s): Limited activity within patient's tolerance, Monitored during session, Repositioned  Home Living                                          Prior Functioning/Environment              Frequency  Min 2X/week        Progress Toward Goals  OT Goals(current goals can now be found in the care plan section)  Progress towards OT goals: Progressing toward goals  Acute Rehab OT Goals Patient Stated Goal: get better OT Goal Formulation: With patient Time For Goal Achievement: 12/20/21 Potential to Achieve Goals: Fair ADL Goals Pt Will Perform Grooming: Independently;sitting;bed level Pt Will Perform Lower Body Bathing: with mod assist;with adaptive equipment;sitting/lateral leans Pt Will Perform Lower Body Dressing: with mod assist;with adaptive equipment;sitting/lateral leans Pt Will Transfer to Toilet: with mod assist;with transfer board;bedside commode Pt/caregiver will Perform Home Exercise Program: Increased strength;Both right and left upper extremity;With theraband;Independently Additional ADL Goal #1: Pt will perform bed mobility with supervision in prep for ADLs Additional ADL Goal #2: Pt will tolerate sitting up in wheelchair/recliner for 1.5 hours in order to improve activity tolerance for ADLs/mobility.  Plan Discharge plan remains appropriate;Frequency remains appropriate    Co-evaluation                 AM-PAC OT "6 Clicks" Daily Activity     Outcome Measure   Help from another person eating meals?: A Little Help from another person taking care of personal grooming?: A Little Help from another person toileting, which includes using toliet, bedpan, or urinal?: Total Help from another person bathing (including washing, rinsing, drying)?: A Lot Help from another person to put on and taking off regular upper body clothing?: A Little Help from another person to put on and taking off regular lower body clothing?: A  Lot 6 Click Score: 14    End of Session Equipment Utilized During Treatment: Oxygen  OT Visit Diagnosis: Muscle weakness (generalized) (M62.81);History of falling (Z91.81) Pain - Right/Left: Right Pain - part of body: Leg   Activity Tolerance Patient tolerated treatment well   Patient Left in bed;with call bell/phone within reach   Nurse Communication Mobility status        Time: 0762-2633 OT Time Calculation (min): 62 min  Charges: OT General Charges $OT Visit: 1 Visit OT Treatments $Self Care/Home Management : 53-67 mins  Lodema Hong, [place]  Office [place] 12/17/2021, 2:29 PM

## 2021-12-18 DIAGNOSIS — Z89612 Acquired absence of left leg above knee: Secondary | ICD-10-CM

## 2021-12-18 DIAGNOSIS — D72829 Elevated white blood cell count, unspecified: Secondary | ICD-10-CM

## 2021-12-18 DIAGNOSIS — N186 End stage renal disease: Secondary | ICD-10-CM | POA: Diagnosis not present

## 2021-12-18 DIAGNOSIS — Z89611 Acquired absence of right leg above knee: Secondary | ICD-10-CM | POA: Diagnosis not present

## 2021-12-18 DIAGNOSIS — D631 Anemia in chronic kidney disease: Secondary | ICD-10-CM

## 2021-12-18 DIAGNOSIS — I739 Peripheral vascular disease, unspecified: Secondary | ICD-10-CM | POA: Diagnosis not present

## 2021-12-18 DIAGNOSIS — J9611 Chronic respiratory failure with hypoxia: Secondary | ICD-10-CM

## 2021-12-18 DIAGNOSIS — I5033 Acute on chronic diastolic (congestive) heart failure: Secondary | ICD-10-CM

## 2021-12-18 DIAGNOSIS — S68113A Complete traumatic metacarpophalangeal amputation of left middle finger, initial encounter: Secondary | ICD-10-CM

## 2021-12-18 LAB — RESPIRATORY PANEL BY PCR

## 2021-12-18 LAB — GLUCOSE, CAPILLARY
Glucose-Capillary: 93 mg/dL (ref 70–99)
Glucose-Capillary: 98 mg/dL (ref 70–99)
Glucose-Capillary: 111 mg/dL — ABNORMAL HIGH (ref 70–99)

## 2021-12-18 LAB — SARS CORONAVIRUS 2 BY RT PCR: SARS Coronavirus 2 by RT PCR: NEGATIVE

## 2021-12-18 MED ORDER — SEVELAMER CARBONATE 0.8 G PO PACK
2.4000 g | PACK | Freq: Three times a day (TID) | ORAL | Status: DC
  Administered 2021-12-18 – 2021-12-29 (×18): 2.4 g via ORAL
  Administered 2021-12-29: 1.6 g via ORAL
  Administered 2021-12-30 – 2022-01-02 (×9): 2.4 g via ORAL
  Filled 2021-12-18 (×33): qty 3

## 2021-12-18 MED ORDER — LORATADINE 10 MG PO TABS
10.0000 mg | ORAL_TABLET | Freq: Every day | ORAL | Status: DC
  Administered 2021-12-18 – 2022-01-14 (×17): 10 mg via ORAL
  Filled 2021-12-18 (×34): qty 1

## 2021-12-18 MED ORDER — HEPARIN SODIUM (PORCINE) 1000 UNIT/ML IJ SOLN
INTRAMUSCULAR | Status: AC
  Filled 2021-12-18: qty 1

## 2021-12-18 NOTE — Progress Notes (Addendum)
  Progress Note    12/18/2021 9:08 AM 14 Days Post-Op  Subjective:  Patient says she does not feel well. Currently eating breakfast in bed    Vitals:   12/17/21 2202 12/18/21 0625  BP: (!) 134/48 (!) 156/65  Pulse: 79 93  Resp: 19 18  Temp: 98.6 F (37 C) 97.6 F (36.4 C)  SpO2: 100% 100%    Physical Exam: Lungs:  nonlabored Extremities: palpable right radial pulse  CBC    Component Value Date/Time   WBC 12.7 (H) 12/16/2021 1554   RBC 2.39 (L) 12/16/2021 1554   HGB 7.2 (L) 12/16/2021 1554   HCT 23.8 (L) 12/16/2021 1554   PLT 282 12/16/2021 1554   MCV 99.6 12/16/2021 1554   MCH 30.1 12/16/2021 1554   MCHC 30.3 12/16/2021 1554   RDW 15.4 12/16/2021 1554   LYMPHSABS 1.9 12/15/2021 0313   MONOABS 1.5 (H) 12/15/2021 0313   EOSABS 1.0 (H) 12/15/2021 0313   BASOSABS 0.1 12/15/2021 0313    BMET    Component Value Date/Time   NA 135 12/16/2021 1554   K 5.5 (H) 12/16/2021 1554   CL 98 12/16/2021 1554   CO2 24 12/16/2021 1554   GLUCOSE 106 (H) 12/16/2021 1554   BUN 47 (H) 12/16/2021 1554   CREATININE 9.51 (H) 12/16/2021 1554   CALCIUM 9.3 12/16/2021 1554   GFRNONAA 5 (L) 12/16/2021 1554   GFRAA 6 (L) 11/29/2019 0120    INR    Component Value Date/Time   INR 1.3 (H) 10/09/2021 1219     Intake/Output Summary (Last 24 hours) at 12/18/2021 0908 Last data filed at 12/17/2021 1830 Gross per 24 hour  Intake 720 ml  Output --  Net 720 ml     Assessment/Plan:  51 y.o. female with L IJ TDC, in need of R AVG or AVF   -Patient is not feeling well, says she has cold like symptoms currently -Plan was for R arm AVF vs AVG with Dr.Navid Lenzen tomorrow, however given the patient's wellbeing she would like to postpone the surgery -Will revisit when she feels well again  Vicente Serene, PA-C [facility] [phone] 12/18/2021 9:08 AM   Addendum: We were notified that patient is not amenable to surgery tomorrow.  Please contact our service when she  is amenable and we will get her scheduled at that time.  Until then she will continue to dialyze via catheter.  Sanjuan Sawa C. Donzetta Matters, MD [facility] [place] Office: [phone] Pager: [phone]

## 2021-12-18 NOTE — Progress Notes (Signed)
Received patient in bed to unit.  Alert and oriented.  Informed consent signed and in chart.   Treatment initiated: 1436 Treatment completed: [place] [facility]  Patient tolerated well.  Transported back to the room  Alert, without acute distress.  Hand-off given to patient's nurse.   Access used: Cath Access issues: Positional   Total UF removed: 1.5L Medication(s) given: none Post HD VS: 130/50,98.5,73,17,100% Post HD weight: Unable to weigh.   [facility]

## 2021-12-18 NOTE — Progress Notes (Signed)
[facility] Progress Note   Subjective:  Seen in room - refused HD this AM, wants to come later. Tells me that would like afternoon spot every time - will try to accommodate. BP better today - floor RN aware to give midodrine right before she comes up for dialysis. Says she doesn't feel great today, but no specific symptoms. No CP, dyspnea, no hip/sacral pain, no fever/chills. Plan was for AVF tomorrow - looks like this is postponed.  Objective Vitals:   12/17/21 0830 12/17/21 1200 12/17/21 2202 12/18/21 0625  BP: (!) 88/44 (!) 121/43 (!) 134/48 (!) 156/65  Pulse: 78 80 79 93  Resp: 14  19 18   Temp: 98 F (36.7 C) 97.8 F (36.6 C) 98.6 F (37 C) 97.6 F (36.4 C)  TempSrc: Oral Oral  Oral  SpO2: 100% 100% 100% 100%  Weight:      Height:       Physical Exam General: Well-appearing, obese woman, NAD. Nasal O2 in place. Heart: RRR; no murmur Lungs: CTA anteriorly Abdomen: soft Extremities: B AKA, no stump edema Dialysis Access: [facility]  Additional Objective Labs: Basic Metabolic Panel: Recent Labs  Lab 12/14/21 0827 12/15/21 0313 12/16/21 1554  NA 135 134* 135  K 4.8 5.0 5.5*  CL 98 97* 98  CO2 27 24 24   GLUCOSE 125* 94 106*  BUN 29* 32* 47*  CREATININE 6.74* 7.00* 9.51*  CALCIUM 9.2 9.4 9.3  PHOS 5.6* 5.0* 6.4*   Liver Function Tests: Recent Labs  Lab 12/14/21 0827 12/15/21 0313 12/16/21 1554  ALBUMIN 2.3* 2.4* 2.4*   CBC: Recent Labs  Lab 12/12/21 0823 12/13/21 0839 12/14/21 0827 12/15/21 0313 12/16/21 1554  WBC 15.5* 13.9* 14.0* 14.4* 12.7*  NEUTROABS  --   --   --  9.8*  --   HGB 7.2* 7.6* 7.1* 7.3* 7.2*  HCT 24.3* 26.0* 24.3* 23.7* 23.8*  MCV 101.7* 102.0* 102.1* 99.2 99.6  PLT 305 271 284 254 282   Medications:   aspirin EC  81 mg Oral Daily   atorvastatin  10 mg Oral Daily   darbepoetin (ARANESP) injection - DIALYSIS  200 mcg Intravenous Q Mon-HD   docusate sodium  100 mg Oral BID   feeding supplement  1 Container Oral TID BM    gabapentin  100 mg Oral Q1200   gabapentin  200 mg Oral QHS   heparin  5,000 Units Subcutaneous Q8H   insulin aspart  0-6 Units Subcutaneous TID WC   levothyroxine  100 mcg Oral Q0600   lubiprostone  8 mcg Oral Q Tue   And   lubiprostone  8 mcg Oral Q Sat   midodrine  10 mg Oral Q M,W,F-HD   pantoprazole  40 mg Oral Daily   sevelamer carbonate  1.6 g Oral TID WC    Dialysis Orders: MWF - [facility]  4hrs, BFR 400, DFR 600,  AVF, EDW 146kg, 3K/ 2Ca - last hep B labs 8/21 - Heparin 2500 units IV with HD - Hectorol 11mcg IV q HD, held as of 10/30/21   Assessment/Plan: ESRD: Was off schedule last week d/t [facility] issues, back to MWF schedule this week - next HD today (9/13). TDC malfunction: Had required two [facility] exchanges and having issues again on 9/4, got overnight tPA dwell. IR thinks TDC is positional as no fibrin on tip at last exchange. Discussed the need for a long term solution, VVS consulted - plan was for RUE AVF this week (12/19/21) - ?postponed.  Heparin boluses restarted with dialysis. BP/volume: BP variable, was on low dose metoprolol and midodine -> metop discontinued on 9/1. Last hoyer weight was 125kg which is down significantly from OP EDW -> lower on discharge. BP dropped pretty significantly with HD 9/11 - midodrine was given too early, will work on timing with midodrine today. BP fine this AM.  Chronic respiratory failure with hypoxia on cannula oxygen at home: on 5L O2 at baseline. Anemia of ESRD: Hgb 7.2 - continue high dose ESA q Monday while here. Transfuse < 7. Secondary hyperparathyroidism: CorrCa high, VDRA on hold. Phos high, continue Renvela as binder but ^ dose. 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 Hypokalemia: Recurrent issue recently and dislikes Klor-Con tabs. Was using high K bath with HD -> back to 2K usual. Severe sacral decubitus  wound: Per primary/wound care team. Disposition: Difficult situation. Prior to admit,  patient required 6 staff members and 2 hoyer pads for transfer into HD chair as OP. Could not stay on dialysis for full treatment because of pain of multiple decubitus ulcers and PTAR unable to lift pt into ambulance for transport. SW has been unable to locate stretcher dialysis location so far. Her weight is much lower from admit, asked PT about trying to hoyer to recliner for HD -> apparently would need hoyer that wraps around her legs and there is concern for worsening her chronic wounds - unclear path at this time. Continues to need SNF on discharge.  [name], PA-C 12/18/2021, 9:20 AM  [employer]

## 2021-12-18 NOTE — Progress Notes (Signed)
PROGRESS NOTE  Courtney Gray DGU:[id number] DOB: July 30, 1970   PCP: Monico Blitz, MD  Patient is from: ???  DOA: 11/01/2021 LOS: 40  Chief complaints Chief Complaint  Patient presents with   Vascular Access Problem     Brief Narrative / Interim history: 51 year old F with PMH of ESRD on HD TTS, recent hospitalization for bacteremia, hepatic and splenic abscess on long-term IV antibiotics, diastolic CHF, PVD s/p bilateral AKA, IDDM-2, morbid obesity and chronic sacral wound presenting with progressive shortness of breath in the setting of missed dialysis and admitted for hypertensive emergency.  Per patient, she never missed her dialysis however according to outpatient HD records, it takes at least 4 staff members and [facility] lift for patient to be seated in the dialysis chair.  Symptoms improved with hemodialysis.  Patient underwent R UE venogram on 8/30.  Vascular surgery following for possible RUE HD access.  Patient is now waiting placement where she can get HD on structure.   Subjective: Seen and examined earlier this morning.  No major events overnight of this morning.  She reports sneezing, headache and fatigue this morning.  She denies sore throat, chest pain, shortness of breath or cough.  She denies GI symptoms.  Objective: Vitals:   12/17/21 1746 12/17/21 2202 12/18/21 0625 12/18/21 0935  BP: (!) 134/50 (!) 134/48 (!) 156/65 (!) 135/56  Pulse: 85 79 93 86  Resp: 19 19 18 19   Temp: 98.4 F (36.9 C) 98.6 F (37 C) 97.6 F (36.4 C) 98 F (36.7 C)  TempSrc: Oral  Oral Oral  SpO2: 100% 100% 100% 100%  Weight:      Height:        Examination:  GENERAL: No apparent distress.  Nontoxic. HEENT: MMM.  Vision and hearing grossly intact.  NECK: Supple.  No apparent JVD.  RESP:  No IWOB.  Fair aeration bilaterally. CVS:  RRR. Heart sounds normal.  ABD/GI/GU: BS+. Abd soft, NTND.  MSK/EXT:  Moves extremities. No apparent deformity. No edema.  SKIN: no apparent skin lesion or  wound NEURO: Awake, alert and oriented appropriately.  No apparent focal neuro deficit. PSYCH: Calm. Normal affect.   Procedures:  Hemodialysis  Microbiology summarized: 9/13-full RVP nonreactive. 9/13-COVID-19 PCR pending.  Assessment and plan: Principal Problem:   End stage renal disease ([place]) Active Problems:   Hypertension   Decubitus ulcer of sacral region, stage 2 (HCC)   Type 2 diabetes mellitus with hyperlipidemia (HCC)   Chronic respiratory failure with hypoxia (HCC)   Bacteremia   PVD (peripheral vascular disease) (HCC)   Class 3 obesity (HCC)   Anemia due to chronic kidney disease   Hypertensive emergency   Leukocytosis   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HCC)   Pressure injury of skin   Acute on chronic diastolic CHF (congestive heart failure) (HCC)   PAD (peripheral artery disease) (HCC)   Irritable bowel syndrome (IBS)   Amputation of left middle finger   GERD (gastroesophageal reflux disease)  ESRD on HD -Vascular surgery evaluating for dialysis access -Reportedly needed 4 people and Hoyer lift outpatient HD -Trying to find a place where she can get HD and structure of bed.   Chronic hypoxic respiratory failure: Reports using 3 L at baseline.  Saturating at 100% on 3 L..  She does not want a oxygen decreased further.  Acute on chronic diastolic CHF/volume overload/hypertensive emergency -Nephrology managing volume with HD -Hold Imdur and metoprolol on hold due to hypotension -Continue midodrine   Hypotension: Seems to  have resolved with midodrine. -Continue midodrine   Anemia of CKD: H&H relatively stable. Recent Labs    12/06/21 0855 12/07/21 1555 12/09/21 0254 12/10/21 2058 12/12/21 0627 12/12/21 0823 12/13/21 0839 12/14/21 0827 12/15/21 0313 12/16/21 1554  HGB 7.5* 7.3* 8.4* 7.8* 7.8* 7.2* 7.6* 7.1* 7.3* 7.2*  -Could benefit from a unit of blood -IV iron weekly per nephrology.   Controlled IDDM-2: A1c 5.7% on  07/09/2021. Recent Labs  Lab 12/16/21 2201 12/17/21 1213 12/17/21 1639 12/17/21 2036 12/18/21 0818  GLUCAP 102* 109* 102* 142* 93  -Recheck hemoglobin A1c -Consider discontinuing CBG monitoring  Recent septic shock secondary to Bacteroides, bacteremia and liver, and splenic abscess-completed antibiotic course.   Leukocytosis: Seems chronic but improved. -Monitor intermittently   PAD s/p bilateral AKA -Continue aspirin and statin. -Vascular surgery following. Continue aspirin.   Hyperlipidemia -Statin.    Hypomagnesemia/hypokalemia/hyponatremia/hypophosphatemia -Per nephrology   Irritable bowel syndrome with constipation. Intractable nausea and vomiting -Continue Amitiza.  Sneezing, headache and fatigue: Clear etiology.  COVID-19 and full RVP nonreactive. -Trial of Claritin -Discontinue isolation precaution  Morbid obesity Body mass index is 45.01 kg/m.   Right hip unstageable pressure injury. Left flank stage II pressure ulcer. Pressure Injury 11/12/21 Hip Right Unstageable - Full thickness tissue loss in which the base of the injury is covered by slough (yellow, tan, gray, green or brown) and/or eschar (tan, brown or black) in the wound bed. 90% covered with slough (Active)  11/12/21 0800  Location: Hip  Location Orientation: Right  Staging: Unstageable - Full thickness tissue loss in which the base of the injury is covered by slough (yellow, tan, gray, green or brown) and/or eschar (tan, brown or black) in the wound bed.  Wound Description (Comments): 90% covered with slough  Present on Admission: Yes (per RN)  Dressing Type Foam - Lift dressing to assess site every shift;Silver hydrofiber 12/17/21 0830     Pressure Injury 11/24/21 Flank Left;Lower Stage 2 -  Partial thickness loss of dermis presenting as a shallow open injury with a red, pink wound bed without slough. (Active)  11/24/21 0430  Location: Flank  Location Orientation: Left;Lower  Staging: Stage 2 -   Partial thickness loss of dermis presenting as a shallow open injury with a red, pink wound bed without slough.  Wound Description (Comments):   Present on Admission: No  Dressing Type Foam - Lift dressing to assess site every shift 12/17/21 0830   DVT prophylaxis:  heparin injection 5,000 Units Start: 12/01/21 1400  Code Status: Full code Family Communication: None at bedside Level of care: Med-Surg Status is: Inpatient Remains inpatient appropriate because: Lack of safe disposition.  Patient needs HD on a stretcher   Final disposition: SNF Consultants:  Nephrology Vascular surgery  Sch Meds:  Scheduled Meds:  aspirin EC  81 mg Oral Daily   atorvastatin  10 mg Oral Daily   darbepoetin (ARANESP) injection - DIALYSIS  200 mcg Intravenous Q Mon-HD   docusate sodium  100 mg Oral BID   feeding supplement  1 Container Oral TID BM   gabapentin  100 mg Oral Q1200   gabapentin  200 mg Oral QHS   heparin  5,000 Units Subcutaneous Q8H   insulin aspart  0-6 Units Subcutaneous TID WC   levothyroxine  100 mcg Oral Q0600   lubiprostone  8 mcg Oral Q Tue   And   lubiprostone  8 mcg Oral Q Sat   midodrine  10 mg Oral Q M,W,F-HD   pantoprazole  40 mg Oral Daily   sevelamer carbonate  2.4 g Oral TID WC   Continuous Infusions: PRN Meds:.acetaminophen, diphenhydrAMINE, docusate sodium, heparin sodium (porcine), HYDROmorphone, ondansetron (ZOFRAN) IV **OR** ondansetron, mouth rinse, oxyCODONE, prochlorperazine, simethicone  Antimicrobials: Anti-infectives (From admission, onward)    Start     Dose/Rate Route Frequency Ordered Stop   11/28/21 1215  vancomycin (VANCOCIN) IVPB 1000 mg/200 mL premix        1,000 mg 200 mL/hr over 60 Minutes Intravenous  Once 11/28/21 1117 11/28/21 1512   11/26/21 1000  vancomycin (VANCOCIN) IVPB 1000 mg/200 mL premix        1,000 mg 200 mL/hr over 60 Minutes Intravenous On call 11/25/21 1628 11/26/21 1740   11/04/21 1800  ceFEPIme (MAXIPIME) 2 g in sodium  chloride 0.9 % 100 mL IVPB        2 g 200 mL/hr over 30 Minutes Intravenous Every M-W-F (1800) 11/03/21 0952 11/08/21 2359   11/04/21 1200  ceFEPIme (MAXIPIME) 2 g in sodium chloride 0.9 % 100 mL IVPB  Status:  Discontinued        2 g 200 mL/hr over 30 Minutes Intravenous Every M-W-F (Hemodialysis) 11/02/21 0941 11/03/21 0952   11/02/21 1800  ceFEPIme (MAXIPIME) 2 g in sodium chloride 0.9 % 100 mL IVPB        2 g 200 mL/hr over 30 Minutes Intravenous  Once 11/02/21 0941 11/02/21 1812   11/02/21 1200  ceFEPIme (MAXIPIME) 2 g in sodium chloride 0.9 % 100 mL IVPB  Status:  Discontinued        2 g 200 mL/hr over 30 Minutes Intravenous Every T-Th-Sa (Hemodialysis) 11/01/21 1934 11/02/21 0941   11/01/21 2200  metroNIDAZOLE (FLAGYL) tablet 500 mg        500 mg Oral Every 12 hours 11/01/21 1827 11/08/21 2225   11/01/21 1945  ceFEPIme (MAXIPIME) 1 g in sodium chloride 0.9 % 100 mL IVPB        1 g 200 mL/hr over 30 Minutes Intravenous  Once 11/01/21 1933 11/01/21 2032   11/01/21 1730  ceFEPIme (MAXIPIME) 1 g in sodium chloride 0.9 % 100 mL IVPB  Status:  Discontinued        1 g 200 mL/hr over 30 Minutes Intravenous Every 24 hours 11/01/21 1720 11/01/21 1933        I have personally reviewed the following labs and images: CBC: Recent Labs  Lab 12/12/21 0823 12/13/21 0839 12/14/21 0827 12/15/21 0313 12/16/21 1554  WBC 15.5* 13.9* 14.0* 14.4* 12.7*  NEUTROABS  --   --   --  9.8*  --   HGB 7.2* 7.6* 7.1* 7.3* 7.2*  HCT 24.3* 26.0* 24.3* 23.7* 23.8*  MCV 101.7* 102.0* 102.1* 99.2 99.6  PLT 305 271 284 254 282   BMP &GFR Recent Labs  Lab 12/12/21 0823 12/13/21 0839 12/14/21 0827 12/15/21 0313 12/16/21 1554  NA 136 135 135 134* 135  K 4.4 4.2 4.8 5.0 5.5*  CL 100 99 98 97* 98  CO2 27 26 27 24 24   GLUCOSE 165* 137* 125* 94 106*  BUN 27* 19 29* 32* 47*  CREATININE 6.88* 5.12* 6.74* 7.00* 9.51*  CALCIUM 9.3 9.3 9.2 9.4 9.3  PHOS 5.4* 4.8* 5.6* 5.0* 6.4*   Estimated Creatinine  Clearance: 9.2 mL/min (A) (by C-G formula based on SCr of 9.51 mg/dL (H)). Liver & Pancreas: Recent Labs  Lab 12/12/21 0823 12/13/21 0839 12/14/21 0827 12/15/21 0313 12/16/21 1554  ALBUMIN 2.2* 2.3* 2.3* 2.4* 2.4*  No results for input(s): "LIPASE", "AMYLASE" in the last 168 hours. No results for input(s): "AMMONIA" in the last 168 hours. Diabetic: No results for input(s): "HGBA1C" in the last 72 hours. Recent Labs  Lab 12/16/21 2201 12/17/21 1213 12/17/21 1639 12/17/21 2036 12/18/21 0818  GLUCAP 102* 109* 102* 142* 93   Cardiac Enzymes: No results for input(s): "CKTOTAL", "CKMB", "CKMBINDEX", "TROPONINI" in the last 168 hours. No results for input(s): "PROBNP" in the last 8760 hours. Coagulation Profile: No results for input(s): "INR", "PROTIME" in the last 168 hours. Thyroid Function Tests: No results for input(s): "TSH", "T4TOTAL", "FREET4", "T3FREE", "THYROIDAB" in the last 72 hours. Lipid Profile: No results for input(s): "CHOL", "HDL", "LDLCALC", "TRIG", "CHOLHDL", "LDLDIRECT" in the last 72 hours. Anemia Panel: No results for input(s): "VITAMINB12", "FOLATE", "FERRITIN", "TIBC", "IRON", "RETICCTPCT" in the last 72 hours. Urine analysis: No results found for: "COLORURINE", "APPEARANCEUR", "LABSPEC", "PHURINE", "GLUCOSEU", "HGBUR", "BILIRUBINUR", "KETONESUR", "PROTEINUR", "UROBILINOGEN", "NITRITE", "LEUKOCYTESUR" Sepsis Labs: Invalid input(s): "PROCALCITONIN", "LACTICIDVEN"  Microbiology: Recent Results (from the past 240 hour(s))  Respiratory (~20 pathogens) panel by PCR     Status: None   Collection Time: 12/18/21  8:46 AM   Specimen: Nasopharyngeal Swab; Respiratory  Result Value Ref Range Status   Adenovirus NOT DETECTED NOT DETECTED Final   Coronavirus 229E NOT DETECTED NOT DETECTED Final    Comment: (NOTE) The Coronavirus on the Respiratory Panel, DOES NOT test for the novel  Coronavirus (2019 nCoV)    Coronavirus HKU1 NOT DETECTED NOT DETECTED Final    Coronavirus NL63 NOT DETECTED NOT DETECTED Final   Coronavirus OC43 NOT DETECTED NOT DETECTED Final   Metapneumovirus NOT DETECTED NOT DETECTED Final   Rhinovirus / Enterovirus NOT DETECTED NOT DETECTED Final   Influenza A NOT DETECTED NOT DETECTED Final   Influenza B NOT DETECTED NOT DETECTED Final   Parainfluenza Virus 1 NOT DETECTED NOT DETECTED Final   Parainfluenza Virus 2 NOT DETECTED NOT DETECTED Final   Parainfluenza Virus 3 NOT DETECTED NOT DETECTED Final   Parainfluenza Virus 4 NOT DETECTED NOT DETECTED Final   Respiratory Syncytial Virus NOT DETECTED NOT DETECTED Final   Bordetella pertussis NOT DETECTED NOT DETECTED Final   Bordetella Parapertussis NOT DETECTED NOT DETECTED Final   Chlamydophila pneumoniae NOT DETECTED NOT DETECTED Final   Mycoplasma pneumoniae NOT DETECTED NOT DETECTED Final    Comment: Performed at [facility], [place]. [address], [place], [place] [address]    Radiology Studies: No results found.    Anchor Dwan T. [place]  If 7PM-7AM, please contact night-coverage [email or web address] 12/18/2021, 12:55 PM

## 2021-12-18 NOTE — Progress Notes (Signed)
Patient feeling unwell, general malaise, increased fatigue, sneezing a lot and headaches.   Notified MD Gonfa.

## 2021-12-18 NOTE — Progress Notes (Signed)
Patient called RN to her room and advised vascular team had just rounded on her and asked if she was willing to proceed with fistula procedure tomorrow 12/19/2021.  Patient had advised vascular team that yes she wanted to proceed but then recalled that her daughter was not feeling well and would not be able to be at the hospital tomorrow and patient would like her daughter present on the day that she has her fistula procedure.  Patient stated she also doesn't feel well.  Paged vascular PA Baglia and informed her of patient's concerns/wishes.  PA Baglia advised she will notify MD Donzetta Matters.

## 2021-12-19 ENCOUNTER — Encounter (HOSPITAL_COMMUNITY)
Admission: EM | Disposition: A | Discharge status: Skilled Nursing Facility | Payer: Self-pay | Source: Ambulatory Visit | Attending: Internal Medicine

## 2021-12-19 DIAGNOSIS — N186 End stage renal disease: Secondary | ICD-10-CM | POA: Diagnosis not present

## 2021-12-19 DIAGNOSIS — D72829 Elevated white blood cell count, unspecified: Secondary | ICD-10-CM | POA: Diagnosis not present

## 2021-12-19 DIAGNOSIS — I739 Peripheral vascular disease, unspecified: Secondary | ICD-10-CM | POA: Diagnosis not present

## 2021-12-19 DIAGNOSIS — Z89611 Acquired absence of right leg above knee: Secondary | ICD-10-CM | POA: Diagnosis not present

## 2021-12-19 LAB — RENAL FUNCTION PANEL
Albumin: 2.5 g/dL — ABNORMAL LOW (ref 3.5–5.0)
Anion gap: 10 (ref 5–15)
BUN: 21 mg/dL — ABNORMAL HIGH (ref 6–20)
CO2: 29 mmol/L (ref 22–32)
Calcium: 9.3 mg/dL (ref 8.9–10.3)
Chloride: 99 mmol/L (ref 98–111)
Creatinine, Ser: 5.12 mg/dL — ABNORMAL HIGH (ref 0.44–1.00)
GFR, Estimated: 10 mL/min — ABNORMAL LOW (ref >60)
Glucose, Bld: 111 mg/dL — ABNORMAL HIGH (ref 70–99)
Phosphorus: 4.6 mg/dL (ref 2.5–4.6)
Potassium: 4 mmol/L (ref 3.5–5.1)
Sodium: 138 mmol/L (ref 135–145)

## 2021-12-19 LAB — GLUCOSE, CAPILLARY
Glucose-Capillary: 81 mg/dL (ref 70–99)
Glucose-Capillary: 124 mg/dL — ABNORMAL HIGH (ref 70–99)
Glucose-Capillary: 120 mg/dL — ABNORMAL HIGH (ref 70–99)
Glucose-Capillary: 123 mg/dL — ABNORMAL HIGH (ref 70–99)

## 2021-12-19 LAB — HEMOGLOBIN A1C
Hgb A1c MFr Bld: 5.3 % (ref 4.8–5.6)
Mean Plasma Glucose: 105.41 mg/dL

## 2021-12-19 LAB — CBC
HCT: 24.8 % — ABNORMAL LOW (ref 36.0–46.0)
Hemoglobin: 7.5 g/dL — ABNORMAL LOW (ref 12.0–15.0)
MCH: 30 pg (ref 26.0–34.0)
MCHC: 30.2 g/dL (ref 30.0–36.0)
MCV: 99.2 fL (ref 80.0–100.0)
Platelets: 267 10*3/uL (ref 150–400)
RBC: 2.5 MIL/uL — ABNORMAL LOW (ref 3.87–5.11)
RDW: 15.4 % (ref 11.5–15.5)
WBC: 14.2 10*3/uL — ABNORMAL HIGH (ref 4.0–10.5)
nRBC: 0.1 % (ref 0.0–0.2)

## 2021-12-19 LAB — IRON AND TIBC
Iron: 28 ug/dL (ref 28–170)
Saturation Ratios: 16 % (ref 10.4–31.8)
TIBC: 174 ug/dL — ABNORMAL LOW (ref 250–450)
UIBC: 146 ug/dL

## 2021-12-19 LAB — RETICULOCYTES
Immature Retic Fract: 43.2 % — ABNORMAL HIGH (ref 2.3–15.9)
RBC.: 2.44 MIL/uL — ABNORMAL LOW (ref 3.87–5.11)
Retic Count, Absolute: 91.3 10*3/uL (ref 19.0–186.0)
Retic Ct Pct: 3.7 % — ABNORMAL HIGH (ref 0.4–3.1)

## 2021-12-19 LAB — MAGNESIUM: Magnesium: 1.6 mg/dL — ABNORMAL LOW (ref 1.7–2.4)

## 2021-12-19 LAB — FERRITIN: Ferritin: 1287 ng/mL — ABNORMAL HIGH (ref 11–307)

## 2021-12-19 LAB — VITAMIN B12: Vitamin B-12: 869 pg/mL (ref 180–914)

## 2021-12-19 LAB — FOLATE: Folate: 6.6 ng/mL (ref >5.9)

## 2021-12-19 SURGERY — ARTERIOVENOUS (AV) FISTULA CREATION
Anesthesia: Choice | Laterality: Right

## 2021-12-19 NOTE — Progress Notes (Signed)
Mobility Specialist Progress Note:   12/19/21 1225  Mobility  Activity Turned to left side (repositioning)  Activity Response Tolerated well  $Mobility charge 1 Mobility   Pt requesting assistance with repositioning in bed and placement of pads under BLE. Pt tolerated well. Left with all needs met.   Nelta Numbers Acute Rehab Secure Chat or Office Phone: [phone]

## 2021-12-19 NOTE — Progress Notes (Signed)
PROGRESS NOTE  Courtney Gray ZHY:[id number] DOB: 1970-08-04   PCP: Monico Blitz, MD  Patient is from: ???  DOA: 11/01/2021 LOS: 21  Chief complaints Chief Complaint  Patient presents with   Vascular Access Problem     Brief Narrative / Interim history: 51 year old F with PMH of ESRD on HD TTS, recent hospitalization for bacteremia, hepatic and splenic abscess on long-term IV antibiotics, diastolic CHF, PVD s/p bilateral AKA, IDDM-2, morbid obesity and chronic sacral wound presenting with progressive shortness of breath in the setting of missed dialysis and admitted for hypertensive emergency.  Per patient, she never missed her dialysis however according to outpatient HD records, it takes at least 4 staff members and [facility] lift for patient to be seated in the dialysis chair.  Symptoms improved with hemodialysis.  Patient underwent R UE venogram on 8/30.  Vascular surgery following for possible RUE HD access.  Patient is now waiting placement where she can get HD on stretcher.  Subjective: Seen and examined earlier this morning.  No major events overnight of this morning.  Continues to endorse fatigue and "allergy cold".  No shortness of breath, cough, chest pain or dyspnea.  Objective: Vitals:   12/18/21 1906 12/19/21 0640 12/19/21 0859 12/19/21 1746  BP: (!) 130/50 (!) 140/53 (!) 142/49 (!) 137/55  Pulse: 85 81 81 79  Resp: 17 18 20 19   Temp: 98.5 F (36.9 C) 98.3 F (36.8 C) 98.1 F (36.7 C) 98.4 F (36.9 C)  TempSrc: Oral Oral Oral   SpO2: 100% 100% 100% 100%  Weight:      Height:        Examination:  GENERAL: No apparent distress.  Nontoxic. HEENT: MMM.  Vision and hearing grossly intact.  NECK: Supple.  No apparent JVD.  RESP:  No IWOB.  Fair aeration bilaterally. CVS:  RRR. Heart sounds normal.  ABD/GI/GU: BS+. Abd soft, NTND.  MSK/EXT: Bilateral AKA. SKIN: no apparent skin lesion or wound NEURO: Awake and alert. Oriented appropriately.  No apparent focal neuro  deficit. PSYCH: Calm. Normal affect.   Procedures:  Hemodialysis  Microbiology summarized: 9/13-full RVP nonreactive. 9/13-COVID-19 PCR nonreactive.  Assessment and plan: Principal Problem:   End stage renal disease ([place]) Active Problems:   Hypertension   Decubitus ulcer of sacral region, stage 2 (HCC)   Type 2 diabetes mellitus with hyperlipidemia (HCC)   Chronic respiratory failure with hypoxia (HCC)   Bacteremia   PVD (peripheral vascular disease) (HCC)   Class 3 obesity (HCC)   Anemia due to chronic kidney disease   Hypertensive emergency   Leukocytosis   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HCC)   Pressure injury of skin   Acute on chronic diastolic CHF (congestive heart failure) (HCC)   PAD (peripheral artery disease) (HCC)   Irritable bowel syndrome (IBS)   Amputation of left middle finger   GERD (gastroesophageal reflux disease)  ESRD on HD -Reportedly needed 4 people and [place] lift outpatient HD -Per vascular, patient postponed  aVF placement due to cold-like symptoms -Not able to find stretcher dialysis location.  Final plan on her HD unclear.   Chronic hypoxic respiratory failure: 3 L at baseline.  Saturating at 100% on 3 L.Marland Kitchen  Not willing to decrease her oxygen despite risk-benefit discussion  Acute on chronic diastolic CHF/volume overload/hypertensive emergency -Nephrology managing volume with HD -Imdur and metoprolol on hold due to hypotension -Continue midodrine   Hypotension: Seems to have resolved with midodrine. -Continue midodrine   Anemia of CKD: H&H relatively  stable. Recent Labs    12/07/21 1555 12/09/21 0254 12/10/21 2058 12/12/21 0627 12/12/21 0823 12/13/21 0839 12/14/21 0827 12/15/21 0313 12/16/21 1554 12/19/21 0309  HGB 7.3* 8.4* 7.8* 7.8* 7.2* 7.6* 7.1* 7.3* 7.2* 7.5*  -Could benefit from a unit of blood -IV iron weekly per nephrology.   Controlled IDDM-2: A1c 5.3%. Recent Labs  Lab 12/18/21 1308 12/18/21 2148  12/19/21 0811 12/19/21 1144 12/19/21 1645  GLUCAP 98 111* 81 124* 120*  -Discontinue CBG monitoring and SSI  Recent septic shock secondary to Bacteroides, bacteremia and liver, and splenic abscess-completed antibiotic course.   Leukocytosis: Seems chronic but improved. -Monitor intermittently   PAD s/p bilateral AKA -Continue aspirin and statin. -Vascular surgery following. Continue aspirin.   Hyperlipidemia -Statin.    Hypomagnesemia/hypokalemia/hyponatremia/hypophosphatemia -Per nephrology   Irritable bowel syndrome with constipation. Intractable nausea and vomiting -Continue Amitiza.  Sneezing, headache and fatigue: COVID-19 and full RVP nonreactive.  Allergy? -Continue Claritin  Morbid obesity Body mass index is 45.01 kg/m.   Right hip unstageable pressure injury. Left flank stage II pressure ulcer. Pressure Injury 11/12/21 Hip Right Unstageable - Full thickness tissue loss in which the base of the injury is covered by slough (yellow, tan, gray, green or brown) and/or eschar (tan, brown or black) in the wound bed. 90% covered with slough (Active)  11/12/21 0800  Location: Hip  Location Orientation: Right  Staging: Unstageable - Full thickness tissue loss in which the base of the injury is covered by slough (yellow, tan, gray, green or brown) and/or eschar (tan, brown or black) in the wound bed.  Wound Description (Comments): 90% covered with slough  Present on Admission: Yes (per RN)  Dressing Type Gauze (Comment);Silver hydrofiber 12/19/21 0900     Pressure Injury 11/24/21 Flank Left;Lower Stage 2 -  Partial thickness loss of dermis presenting as a shallow open injury with a red, pink wound bed without slough. (Active)  11/24/21 0430  Location: Flank  Location Orientation: Left;Lower  Staging: Stage 2 -  Partial thickness loss of dermis presenting as a shallow open injury with a red, pink wound bed without slough.  Wound Description (Comments):   Present on  Admission: No  Dressing Type Foam - Lift dressing to assess site every shift 12/19/21 0900   DVT prophylaxis:  heparin injection 5,000 Units Start: 12/01/21 1400  Code Status: Full code Family Communication: None at bedside Level of care: Med-Surg Status is: Inpatient Remains inpatient appropriate because: Lack of safe disposition.  Patient needs HD on a stretcher   Final disposition: SNF Consultants:  Nephrology Vascular surgery  Sch Meds:  Scheduled Meds:  aspirin EC  81 mg Oral Daily   atorvastatin  10 mg Oral Daily   darbepoetin (ARANESP) injection - DIALYSIS  200 mcg Intravenous Q Mon-HD   docusate sodium  100 mg Oral BID   feeding supplement  1 Container Oral TID BM   gabapentin  100 mg Oral Q1200   gabapentin  200 mg Oral QHS   heparin  5,000 Units Subcutaneous Q8H   insulin aspart  0-6 Units Subcutaneous TID WC   levothyroxine  100 mcg Oral Q0600   loratadine  10 mg Oral Daily   lubiprostone  8 mcg Oral Q Tue   And   lubiprostone  8 mcg Oral Q Sat   midodrine  10 mg Oral Q M,W,F-HD   pantoprazole  40 mg Oral Daily   sevelamer carbonate  2.4 g Oral TID WC   Continuous Infusions: PRN Meds:.acetaminophen, diphenhydrAMINE,  docusate sodium, heparin sodium (porcine), HYDROmorphone, ondansetron (ZOFRAN) IV **OR** ondansetron, mouth rinse, oxyCODONE, prochlorperazine, simethicone  Antimicrobials: Anti-infectives (From admission, onward)    Start     Dose/Rate Route Frequency Ordered Stop   11/28/21 1215  vancomycin (VANCOCIN) IVPB 1000 mg/200 mL premix        1,000 mg 200 mL/hr over 60 Minutes Intravenous  Once 11/28/21 1117 11/28/21 1512   11/26/21 1000  vancomycin (VANCOCIN) IVPB 1000 mg/200 mL premix        1,000 mg 200 mL/hr over 60 Minutes Intravenous On call 11/25/21 1628 11/26/21 1740   11/04/21 1800  ceFEPIme (MAXIPIME) 2 g in sodium chloride 0.9 % 100 mL IVPB        2 g 200 mL/hr over 30 Minutes Intravenous Every M-W-F (1800) 11/03/21 0952 11/08/21 2359    11/04/21 1200  ceFEPIme (MAXIPIME) 2 g in sodium chloride 0.9 % 100 mL IVPB  Status:  Discontinued        2 g 200 mL/hr over 30 Minutes Intravenous Every M-W-F (Hemodialysis) 11/02/21 0941 11/03/21 0952   11/02/21 1800  ceFEPIme (MAXIPIME) 2 g in sodium chloride 0.9 % 100 mL IVPB        2 g 200 mL/hr over 30 Minutes Intravenous  Once 11/02/21 0941 11/02/21 1812   11/02/21 1200  ceFEPIme (MAXIPIME) 2 g in sodium chloride 0.9 % 100 mL IVPB  Status:  Discontinued        2 g 200 mL/hr over 30 Minutes Intravenous Every T-Th-Sa (Hemodialysis) 11/01/21 1934 11/02/21 0941   11/01/21 2200  metroNIDAZOLE (FLAGYL) tablet 500 mg        500 mg Oral Every 12 hours 11/01/21 1827 11/08/21 2225   11/01/21 1945  ceFEPIme (MAXIPIME) 1 g in sodium chloride 0.9 % 100 mL IVPB        1 g 200 mL/hr over 30 Minutes Intravenous  Once 11/01/21 1933 11/01/21 2032   11/01/21 1730  ceFEPIme (MAXIPIME) 1 g in sodium chloride 0.9 % 100 mL IVPB  Status:  Discontinued        1 g 200 mL/hr over 30 Minutes Intravenous Every 24 hours 11/01/21 1720 11/01/21 1933        I have personally reviewed the following labs and images: CBC: Recent Labs  Lab 12/13/21 0839 12/14/21 0827 12/15/21 0313 12/16/21 1554 12/19/21 0309  WBC 13.9* 14.0* 14.4* 12.7* 14.2*  NEUTROABS  --   --  9.8*  --   --   HGB 7.6* 7.1* 7.3* 7.2* 7.5*  HCT 26.0* 24.3* 23.7* 23.8* 24.8*  MCV 102.0* 102.1* 99.2 99.6 99.2  PLT 271 284 254 282 267   BMP &GFR Recent Labs  Lab 12/13/21 0839 12/14/21 0827 12/15/21 0313 12/16/21 1554 12/19/21 0309  NA 135 135 134* 135 138  K 4.2 4.8 5.0 5.5* 4.0  CL 99 98 97* 98 99  CO2 26 27 24 24 29   GLUCOSE 137* 125* 94 106* 111*  BUN 19 29* 32* 47* 21*  CREATININE 5.12* 6.74* 7.00* 9.51* 5.12*  CALCIUM 9.3 9.2 9.4 9.3 9.3  MG  --   --   --   --  1.6*  PHOS 4.8* 5.6* 5.0* 6.4* 4.6   Estimated Creatinine Clearance: 17.1 mL/min (A) (by C-G formula based on SCr of 5.12 mg/dL (H)). Liver &  Pancreas: Recent Labs  Lab 12/13/21 0839 12/14/21 0827 12/15/21 0313 12/16/21 1554 12/19/21 0309  ALBUMIN 2.3* 2.3* 2.4* 2.4* 2.5*   No results for input(s): "LIPASE", "  AMYLASE" in the last 168 hours. No results for input(s): "AMMONIA" in the last 168 hours. Diabetic: Recent Labs    12/19/21 0309  HGBA1C 5.3   Recent Labs  Lab 12/18/21 1308 12/18/21 2148 12/19/21 0811 12/19/21 1144 12/19/21 1645  GLUCAP 98 111* 81 124* 120*   Cardiac Enzymes: No results for input(s): "CKTOTAL", "CKMB", "CKMBINDEX", "TROPONINI" in the last 168 hours. No results for input(s): "PROBNP" in the last 8760 hours. Coagulation Profile: No results for input(s): "INR", "PROTIME" in the last 168 hours. Thyroid Function Tests: No results for input(s): "TSH", "T4TOTAL", "FREET4", "T3FREE", "THYROIDAB" in the last 72 hours. Lipid Profile: No results for input(s): "CHOL", "HDL", "LDLCALC", "TRIG", "CHOLHDL", "LDLDIRECT" in the last 72 hours. Anemia Panel: Recent Labs    12/19/21 0309  VITAMINB12 869  FOLATE 6.6  FERRITIN 1,287*  TIBC 174*  IRON 28  RETICCTPCT 3.7*   Urine analysis: No results found for: "COLORURINE", "APPEARANCEUR", "LABSPEC", "PHURINE", "GLUCOSEU", "HGBUR", "BILIRUBINUR", "KETONESUR", "PROTEINUR", "UROBILINOGEN", "NITRITE", "LEUKOCYTESUR" Sepsis Labs: Invalid input(s): "PROCALCITONIN", "LACTICIDVEN"  Microbiology: Recent Results (from the past 240 hour(s))  Respiratory (~20 pathogens) panel by PCR     Status: None   Collection Time: 12/18/21  8:46 AM   Specimen: Nasopharyngeal Swab; Respiratory  Result Value Ref Range Status   Adenovirus NOT DETECTED NOT DETECTED Final   Coronavirus 229E NOT DETECTED NOT DETECTED Final    Comment: (NOTE) The Coronavirus on the Respiratory Panel, DOES NOT test for the novel  Coronavirus (2019 nCoV)    Coronavirus HKU1 NOT DETECTED NOT DETECTED Final   Coronavirus NL63 NOT DETECTED NOT DETECTED Final   Coronavirus OC43 NOT DETECTED  NOT DETECTED Final   Metapneumovirus NOT DETECTED NOT DETECTED Final   Rhinovirus / Enterovirus NOT DETECTED NOT DETECTED Final   Influenza A NOT DETECTED NOT DETECTED Final   Influenza B NOT DETECTED NOT DETECTED Final   Parainfluenza Virus 1 NOT DETECTED NOT DETECTED Final   Parainfluenza Virus 2 NOT DETECTED NOT DETECTED Final   Parainfluenza Virus 3 NOT DETECTED NOT DETECTED Final   Parainfluenza Virus 4 NOT DETECTED NOT DETECTED Final   Respiratory Syncytial Virus NOT DETECTED NOT DETECTED Final   Bordetella pertussis NOT DETECTED NOT DETECTED Final   Bordetella Parapertussis NOT DETECTED NOT DETECTED Final   Chlamydophila pneumoniae NOT DETECTED NOT DETECTED Final   Mycoplasma pneumoniae NOT DETECTED NOT DETECTED Final    Comment: Performed at [facility], [place]. [address], [place], [place] [address]  SARS Coronavirus 2 by RT PCR (hospital order, performed in [facility] hospital lab) *cepheid single result test*     Status: None   Collection Time: 12/18/21  8:46 AM  Result Value Ref Range Status   SARS Coronavirus 2 by RT PCR NEGATIVE NEGATIVE Final    Comment: (NOTE) SARS-CoV-2 target nucleic acids are NOT DETECTED.  The SARS-CoV-2 RNA is generally detectable in upper and lower respiratory specimens during the acute phase of infection. The lowest concentration of SARS-CoV-2 viral copies this assay can detect is 250 copies / mL. A negative result does not preclude SARS-CoV-2 infection and should not be used as the sole basis for treatment or other patient management decisions.  A negative result may occur with improper specimen collection / handling, submission of specimen other than nasopharyngeal swab, presence of viral mutation(s) within the areas targeted by this assay, and inadequate number of viral copies (<250 copies / mL). A negative result must be combined with clinical observations, patient history, and epidemiological information.  Fact Sheet for Patients:    https://www.patel.info/  Fact Sheet for Healthcare Providers: https://hall.com/  This test is not yet approved or  cleared by the [place] FDA and has been authorized for detection and/or diagnosis of SARS-CoV-2 by FDA under an Emergency Use Authorization (EUA).  This EUA will remain in effect (meaning this test can be used) for the duration of the COVID-19 declaration under Section 564(b)(1) of the Act, 21 U.S.C. section 360bbb-3(b)(1), unless the authorization is terminated or revoked sooner.  Performed at [facility], [place] [address] [place], [place] [address]     Radiology Studies: No results found.    Yahya Boldman T. [place]  If 7PM-7AM, please contact night-coverage [email or web address] 12/19/2021, 7:20 PM

## 2021-12-19 NOTE — Consult Note (Signed)
[facility] Nurse wound follow up Consult received to clarify orders of daily vs qshift. After review note from M. Liane Comber, RN the orders should state daily. This order was modified to reflect this.  Cathlean Marseilles Tamala Julian, MSN, RN, [place], Lysle Pearl, [facility] Wound Treatment Associate Pager [phone]

## 2021-12-19 NOTE — TOC Progression Note (Signed)
Transition of Care [facility]) - Initial/Assessment Note    Patient Details  Name: Courtney Gray MRN: [id number] Date of Birth: November 08, 1970  Transition of Care [facility]) CM/SW Contact:    Milinda Antis, LCSWA Phone Number: 12/19/2021, 12:30 PM  Clinical Narrative:                 CSW contacted [facility] to inquire about stretcher dialysis centers/ SNFs in [place] and is awaiting a returned call.   TOC will continue to follow.    Expected Discharge Plan: Long Term Nursing Home Barriers to Discharge: No SNF bed, Transportation, Waiting for outpatient dialysis   Patient Goals and CMS Choice Patient states their goals for this hospitalization and ongoing recovery are:: To be at a facility close to her daughter      Expected Discharge Plan and Services Expected Discharge Plan: [place]       Living arrangements for the past 2 months: Single Family Home                                      Prior Living Arrangements/Services Living arrangements for the past 2 months: Single Family Home Lives with:: Adult Children Patient language and need for interpreter reviewed:: Yes        Need for Family Participation in Patient Care: Yes (Comment) Care giver support system in place?: No (comment)   Criminal Activity/Legal Involvement Pertinent to Current Situation/Hospitalization: No - Comment as needed  Activities of Daily Living Home Assistive Devices/Equipment: [employer] fast streamer ADL Screening (condition at time of admission) Patient's cognitive ability adequate to safely complete daily activities?: No Is the patient deaf or have difficulty hearing?: No Does the patient have difficulty seeing, even when wearing glasses/contacts?: No Does the patient have difficulty concentrating, remembering, or making decisions?: No Patient able to express need for assistance with ADLs?: Yes Does the patient have difficulty dressing or bathing?: Yes Independently performs ADLs?: No Does  the patient have difficulty walking or climbing stairs?: Yes Weakness of Legs: Both Weakness of Arms/Hands: Both  Permission Sought/Granted Permission sought to share information with : Other (comment) Permission granted to share information with : Yes, Verbal Permission Granted  Share Information with NAME: Wildey,antoinette (Daughter)   [phone]  Permission granted to share info w AGENCY: SNF        Emotional Assessment Appearance:: Appears older than stated age Attitude/Demeanor/Rapport: Engaged Affect (typically observed): Tearful/Crying, Accepting Orientation: : Oriented to Situation, Oriented to  Time, Oriented to Place, Oriented to Self Alcohol / Substance Use: Not Applicable Psych Involvement: No (comment)  Admission diagnosis:  End stage renal disease ([place]) [N18.6] Fluid overload [E87.70] Obesity with serious comorbidity, unspecified classification, unspecified obesity type [E66.9] Pressure injury of right buttock, stage 2 (HCC) [L89.312] Patient Active Problem List   Diagnosis Date Noted   Acute on chronic diastolic CHF (congestive heart failure) ([place]) 12/12/2021   PAD (peripheral artery disease) ([place]) 12/12/2021   Irritable bowel syndrome (IBS) 12/12/2021   Amputation of left middle finger 12/12/2021   GERD (gastroesophageal reflux disease) 12/12/2021   Hypertension    Type 2 diabetes mellitus with hyperlipidemia ([place])    Decubitus ulcer of sacral region, stage 2 ([place])    Class 3 obesity ([place])    Bacteremia    PVD (peripheral vascular disease) ([place])    Hepatic abscess    Pressure injury of skin 09/13/2021   S/P AKA (above  knee amputation) bilateral ([place])    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   Lactic acidosis 09/10/2021   Mixed diabetic hyperlipidemia associated with type 2 diabetes mellitus ([place]) 09/10/2021   Coronary artery disease involving native coronary artery of native heart without angina pectoris 09/10/2021   Elevated troponin level not due myocardial  infarction 09/10/2021   Hypotension 09/10/2021   Dry gangrene (HCC)    Pain 08/03/2021   Ischemic foot pain at rest 08/03/2021   Hypoglycemia 07/10/2021   Volume overload 07/09/2021   Leukocytosis 06/11/2021   Acute gout 06/11/2021   Hyperkalemia 05/21/2021   Hemorrhoids    Hyperlipidemia 05/18/2021   Rectal bleeding 05/17/2021   Morbid obesity with BMI of 60.0-69.9, adult ([place]) 05/17/2021   NSTEMI (non-ST elevated myocardial infarction) ([place]) 05/16/2021   Essential hypertension 09/01/2020   End stage renal disease ([place]) 09/01/2020   Chronic respiratory failure with hypoxia ([place]) 09/01/2020   Hypertensive emergency    Hypertensive urgency 11/28/2019   Uncontrolled type 2 diabetes mellitus with hypoglycemia, with long-term current use of insulin ([place]) 11/28/2019   Anemia due to chronic kidney disease 11/28/2019   PCP:  Monico Blitz, MD Pharmacy:   [place] [facility], [place] [address] [place] [address] Phone: [phone] Fax: [phone]  [facility] [address] [place] [address] Phone: [phone] Fax: [phone]  [place], [place] - [address] [place] [address] Phone: [phone] Fax: [phone]     Social Determinants of Health ([place]) Interventions    Readmission Risk Interventions    09/16/2021    4:36 PM 08/28/2021   11:50 AM 07/12/2021   12:38 PM  Readmission Risk Prevention Plan  Transportation Screening Complete Complete Complete  Medication Review (RN Care Manager)  Complete Complete  PCP or Specialist appointment within 3-5 days of discharge  Complete Complete  HRI or Home Care Consult Complete Complete Complete  SW Recovery Care/Counseling Consult Complete Complete Complete  Palliative Care Screening  Not Applicable Not Applicable  Skilled Nursing Facility Complete Not Applicable Complete

## 2021-12-19 NOTE — Progress Notes (Signed)
PT Cancellation Note  Patient Details Name: Courtney Gray MRN: [id number] DOB: 11-11-1970   Cancelled Treatment:    Reason Eval/Treat Not Completed: (P) Pain limiting ability to participate Pt reports wound dressing has recently been changed and pt is in increased pain. Would like PT to follow back. PT will try to orchestrate +2 assistance and will follow back if able.  Eron Goble B. [place] Dk PT, DPT Acute Rehabilitation Services Please use secure chat or  Call Office [phone]   [place] 12/19/2021, 2:17 PM

## 2021-12-19 NOTE — Progress Notes (Signed)
Physical Therapy Treatment Patient Details Name: Courtney Gray MRN: [id number] DOB: 08-27-1970 Today's Date: 12/19/2021   History of Present Illness Pt adm 7/28 with acute pulmonary edema. 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    PT Comments    Pt reluctantly agreeable to get out of bed to wheelchair fitted with [facility] cushion. Pt with trepidation due to very painful experience with OOB over 2 weeks ago. Pt is min A for rolling and modA for coming all the way onto her side for pad placement. Pt lifted to wheelchair and able to tolerate ~30 min, unable to propel due to width of chair to accomodate L lateral lean to stay off wound. PT will bring smaller wheelchair to next session to work on propulsion. D/c remains appropriate.   Recommendations for follow up therapy are one component of a multi-disciplinary discharge planning process, led by the attending physician.  Recommendations may be updated based on patient status, additional functional criteria and insurance authorization.  Follow Up Recommendations  Skilled nursing-short term rehab (<3 hours/day) Can patient physically be transported by private vehicle: No   Assistance Recommended at Discharge Frequent or constant Supervision/Assistance  Patient can return home with the following Two people to help with walking and/or transfers;Two people to help with bathing/dressing/bathroom;Assist for transportation;Assistance with cooking/housework;Help with stairs or ramp for entrance   Equipment Recommendations  Wheelchair (measurements PT);Wheelchair cushion (measurements PT);Other (comment);Hospital bed (Needs wide w/c with cushion and anti-tippers with ?offset wheels to reduce risk of tipping backwards)    Recommendations for Other Services Rehab consult     Precautions / Restrictions Precautions Precautions: Fall Precaution Comments: bil AKA, 3L  O2 at baseline, LUQ drain Restrictions Weight Bearing Restrictions: Yes RLE Weight Bearing: Non weight bearing LLE Weight Bearing: Non weight bearing     Mobility  Bed Mobility Overal bed mobility: Needs Assistance Bed Mobility: Supine to Sit Rolling: Min assist   Supine to sit: Min assist     General bed mobility comments: min A to initiate roll and modA to come all the way onto her hip for maxisky pad placement on unstable air mattress,    Transfers Overall transfer level: Needs assistance Equipment used:  (maxisky) Transfers: Bed to chair/wheelchair/BSC             General transfer comment: lifted to and from wheelchair with [employer] and Therapist, sports (with amputee lift pad)  Ambulation/Gait               General Gait Details: unable         Information systems manager mobility: Yes Wheelchair propulsion:  (unable to propel due to larger wheelchair and could not reach wheels, tolerated up in wheelchair for  25) Wheelchair parts: Needs assistance Wheelchair Assistance Details (indicate cue type and reason): worked on sitting tolerance with [employer] cushion and L lateral lean  Modified Rankin (Stroke Patients Only)       Balance Overall balance assessment: Needs assistance Sitting-balance support: No upper extremity supported Sitting balance-[name] Scale: Good Sitting balance - Comments: with long sitting in bed                                    Cognition Arousal/Alertness: Awake/alert Behavior During Therapy: WFL for tasks assessed/performed Overall Cognitive Status: Within Functional Limits  for tasks assessed                                             General Comments General comments (skin integrity, edema, etc.): VSS on 3L O2 via [place]      Pertinent Vitals/Pain Pain Assessment Pain Assessment: Faces Faces Pain Scale: Hurts a little bit Pain  Location: R ischial pressure wound Pain Descriptors / Indicators: Guarding, Sore                                          PT Goals (current goals can now be found in the care plan section) Acute Rehab PT Goals Patient Stated Goal: Pt wants to get up into a w/c PT Goal Formulation: With patient Time For Goal Achievement: 12/24/21 Potential to Achieve Goals: Fair Progress towards PT goals: Progressing toward goals    Frequency    Min 3X/week      PT Plan Discharge plan needs to be updated       AM-PAC PT "6 Clicks" Mobility   Outcome Measure  Help needed turning from your back to your side while in a flat bed without using bedrails?: Total Help needed moving from lying on your back to sitting on the side of a flat bed without using bedrails?: Total Help needed moving to and from a bed to a chair (including a wheelchair)?: Total Help needed standing up from a chair using your arms (e.g., wheelchair or bedside chair)?: Total Help needed to walk in hospital room?: Total Help needed climbing 3-5 steps with a railing? : Total 6 Click Score: 6    End of Session Equipment Utilized During Treatment: Oxygen Activity Tolerance: Patient tolerated treatment well Patient left: with call bell/phone within reach;in bed Nurse Communication: Mobility status PT Visit Diagnosis: Other abnormalities of gait and mobility (R26.89);Muscle weakness (generalized) (M62.81) Pain - Right/Left: Right Pain - part of body:  (ischial wound)     Time: 8295-6213 PT Time Calculation (min) (ACUTE ONLY): 72 min  Charges:  $Therapeutic Activity: 23-37 mins $Wheel Chair Management: 23-37 mins                     Marigene Erler B. Migdalia Dk PT, DPT Acute Rehabilitation Services Please use secure chat or  Call Office [phone]    [place] 12/19/2021, 3:58 PM

## 2021-12-19 NOTE — Progress Notes (Signed)
Occupational Therapy Treatment and Discharge Patient Details Name: Courtney Gray MRN: [id number] DOB: [birth year] Today's Date: 12/19/2021   History of present illness Pt adm 7/28 with acute pulmonary edema. 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   OT comments  Pt approached to update OT goals. Pt has been working toward the same goals x 1 month without progress. She is independent in UE theraband exercises. Buttocks wound is a barrier to progress in sitting and toward ADL independence as well as body habitus. OT will discharge pt and await significant change in wound and EOB/OOB tolerance.   Recommendations for follow up therapy are one component of a multi-disciplinary discharge planning process, led by the attending physician.  Recommendations may be updated based on patient status, additional functional criteria and insurance authorization.    Follow Up Recommendations  Skilled nursing-short term rehab (<3 hours/day)    Assistance Recommended at Discharge Frequent or constant Supervision/Assistance  Patient can return home with the following  Two people to help with walking and/or transfers;Two people to help with bathing/dressing/bathroom;Assistance with cooking/housework;Direct supervision/assist for medications management;Assist for transportation;Help with stairs or ramp for entrance   Equipment Recommendations  Other (comment) (defer to next venue)    Recommendations for Other Services      Precautions / Restrictions Precautions Precautions: Fall Precaution Comments: bil AKA, 3L O2 at baseline, LUQ drain Restrictions Weight Bearing Restrictions: Yes RLE Weight Bearing: Non weight bearing LLE Weight Bearing: Non weight bearing       Mobility Bed Mobility                    Transfers                   General transfer comment: plans to get OOB with PT to w/c      Balance                                           ADL either performed or assessed with clinical judgement   ADL                                         General ADL Comments: Pt continues to be able to self feed and groom with set up and participate in UB bathing and dressing. She remains dependent in pericare and all LB ADLs.    Extremity/Trunk Assessment              Vision       Perception     Praxis      Cognition Arousal/Alertness: Awake/alert Behavior During Therapy: WFL for tasks assessed/performed Overall Cognitive Status: Within Functional Limits for tasks assessed                                          Exercises      Shoulder Instructions       General Comments      Pertinent Vitals/ Pain       Pain Assessment Pain Assessment: No/denies pain  Home Living  Prior Functioning/Environment              Frequency           Progress Toward Goals  OT Goals(current goals can now be found in the care plan section)  Progress towards OT goals: Not progressing toward goals - comment     Plan Discharge plan remains appropriate    Co-evaluation                 AM-PAC OT "6 Clicks" Daily Activity     Outcome Measure   Help from another person eating meals?: None Help from another person taking care of personal grooming?: A Little Help from another person toileting, which includes using toliet, bedpan, or urinal?: Total Help from another person bathing (including washing, rinsing, drying)?: A Lot Help from another person to put on and taking off regular upper body clothing?: A Little Help from another person to put on and taking off regular lower body clothing?: A Lot 6 Click Score: 15    End of Session Equipment Utilized During Treatment: Oxygen  OT Visit Diagnosis: Muscle weakness (generalized) (M62.81);History of  falling (Z91.81)   Activity Tolerance Patient tolerated treatment well   Patient Left in bed;with call bell/phone within Gray   Nurse Communication          Time: 9417-4081 OT Time Calculation (min): 13 min  Charges: OT General Charges $OT Visit: 1 Visit OT Treatments $Therapeutic Activity: 8-22 mins  Cleta Alberts, OTR/L Acute Rehabilitation Services Office: [phone]   [name] 12/19/2021, 2:37 PM

## 2021-12-19 NOTE — Progress Notes (Signed)
[facility] Progress Note   Subjective:  Seen in room - still feeling poorly, says "allergies or a cold." COVID + viral resp panel negative yesterday. No CP/dyspnea. BP did fine during HD yesterday - no drops.  Objective Vitals:   12/18/21 1855 12/18/21 1906 12/19/21 0640 12/19/21 0859  BP: (!) 122/42 (!) 130/50 (!) 140/53 (!) 142/49  Pulse:  85 81 81  Resp: (!) 21 17 18 20   Temp:  98.5 F (36.9 C) 98.3 F (36.8 C) 98.1 F (36.7 C)  TempSrc:  Oral Oral Oral  SpO2: 100% 100% 100% 100%  Weight:      Height:       Physical Exam General: Well-appearing, obese woman, NAD. Nasal O2 in place. Heart: RRR; no murmur Lungs: CTA anteriorly Abdomen: soft Extremities: B AKA, no stump edema Dialysis Access: [facility]  Additional Objective Labs: Basic Metabolic Panel: Recent Labs  Lab 12/15/21 0313 12/16/21 1554 12/19/21 0309  NA 134* 135 138  K 5.0 5.5* 4.0  CL 97* 98 99  CO2 24 24 29   GLUCOSE 94 106* 111*  BUN 32* 47* 21*  CREATININE 7.00* 9.51* 5.12*  CALCIUM 9.4 9.3 9.3  PHOS 5.0* 6.4* 4.6   Liver Function Tests: Recent Labs  Lab 12/15/21 0313 12/16/21 1554 12/19/21 0309  ALBUMIN 2.4* 2.4* 2.5*   CBC: Recent Labs  Lab 12/13/21 0839 12/14/21 0827 12/15/21 0313 12/16/21 1554 12/19/21 0309  WBC 13.9* 14.0* 14.4* 12.7* 14.2*  NEUTROABS  --   --  9.8*  --   --   HGB 7.6* 7.1* 7.3* 7.2* 7.5*  HCT 26.0* 24.3* 23.7* 23.8* 24.8*  MCV 102.0* 102.1* 99.2 99.6 99.2  PLT 271 284 254 282 267   Medications:   aspirin EC  81 mg Oral Daily   atorvastatin  10 mg Oral Daily   darbepoetin (ARANESP) injection - DIALYSIS  200 mcg Intravenous Q Mon-HD   docusate sodium  100 mg Oral BID   feeding supplement  1 Container Oral TID BM   gabapentin  100 mg Oral Q1200   gabapentin  200 mg Oral QHS   heparin  5,000 Units Subcutaneous Q8H   insulin aspart  0-6 Units Subcutaneous TID WC   levothyroxine  100 mcg Oral Q0600   loratadine  10 mg Oral Daily   lubiprostone   8 mcg Oral Q Tue   And   lubiprostone  8 mcg Oral Q Sat   midodrine  10 mg Oral Q M,W,F-HD   pantoprazole  40 mg Oral Daily   sevelamer carbonate  2.4 g Oral TID WC    Dialysis Orders: MWF - [facility]  4hrs, BFR 400, DFR 600,  AVF, EDW 146kg, 3K/ 2Ca - last hep B labs 8/21 - Heparin 2500 units IV with HD - Hectorol 84mcg IV q HD, held as of 10/30/21   Assessment/Plan: ESRD: Was off schedule last week d/t [facility] issues, back to MWF schedule this week - next HD tomorrow (9/15). TDC malfunction: Had required two [facility] exchanges and having issues again on 9/4, got overnight tPA dwell. IR thinks TDC is positional as no fibrin on tip at last exchange. Discussed the need for a long term solution, VVS consulted - plan was for RUE AVF this week (12/19/21) - ?postponed. Heparin boluses restarted with dialysis. BP/volume: BP variable, was on low dose metoprolol and midodine -> metop discontinued on 9/1. Last hoyer weight was 125kg which is down significantly from OP EDW -> lower on discharge.  BP dropped pretty significantly with HD 9/11 - midodrine was given too early, will work on timing with midodrine today. BP fine with HD on 9/13. Chronic respiratory failure with hypoxia on cannula oxygen at home: on 5L O2 at baseline. Anemia of ESRD: Hgb 7.5 - continue high dose ESA q Monday while here. Transfuse < 7. Secondary hyperparathyroidism: CorrCa high, VDRA on hold. Renvela increased - Phos better. 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 Hypokalemia: Recurrent issue recently and dislikes Klor-Con tabs. Was using high K bath with HD -> back to 2K usual. Severe sacral decubitus  wound: Per primary/wound care team. Disposition: Difficult situation. Prior to admit, patient required 6 staff members and 2 hoyer pads for transfer into HD chair as OP. Could not stay on dialysis for full treatment because of pain of multiple decubitus ulcers and PTAR unable to lift pt into ambulance  for transport. SW has been unable to locate stretcher dialysis location so far. Her weight is much lower from admit, asked PT about trying to hoyer to recliner for HD -> apparently would need hoyer that wraps around her legs and there is concern for worsening her chronic wounds - unclear path at this time. Continues to need SNF on discharge.  [name], PA-C 12/19/2021, 9:42 AM  [employer]

## 2021-12-20 DIAGNOSIS — Z89611 Acquired absence of right leg above knee: Secondary | ICD-10-CM | POA: Diagnosis not present

## 2021-12-20 DIAGNOSIS — D72829 Elevated white blood cell count, unspecified: Secondary | ICD-10-CM | POA: Diagnosis not present

## 2021-12-20 DIAGNOSIS — I739 Peripheral vascular disease, unspecified: Secondary | ICD-10-CM | POA: Diagnosis not present

## 2021-12-20 DIAGNOSIS — N186 End stage renal disease: Secondary | ICD-10-CM | POA: Diagnosis not present

## 2021-12-20 LAB — GLUCOSE, CAPILLARY: Glucose-Capillary: 87 mg/dL (ref 70–99)

## 2021-12-20 NOTE — Progress Notes (Signed)
Mobility Specialist Progress Note:   12/20/21 1215  Mobility  Activity Turned to right side;Turned to left side (bed mobility)  Level of Assistance Moderate assist, patient does 50-74%  Assistive Device None  Activity Response Tolerated well  $Mobility charge 1 Mobility   Pt eager for mobility session. Lunch arrived during, limiting session. Pt able to turn in bed, "hip-walk" backward in bed, and performed sit-ups. Pt left sitting up to eat lunch and go to HD later.   Nelta Numbers Acute Rehab Secure Chat or Office Phone: [phone]

## 2021-12-20 NOTE — Progress Notes (Signed)
PROGRESS NOTE  Courtney Gray XBW:[id number] DOB: 08-27-1970   PCP: Monico Blitz, MD  Patient is from: ???  DOA: 11/01/2021 LOS: 38  Chief complaints Chief Complaint  Patient presents with   Vascular Access Problem     Brief Narrative / Interim history: 51 year old F with PMH of ESRD on HD TTS, recent hospitalization for bacteremia, hepatic and splenic abscess on long-term IV antibiotics, diastolic CHF, PVD s/p bilateral AKA, IDDM-2, morbid obesity and chronic sacral wound presenting with progressive shortness of breath in the setting of missed dialysis and admitted for hypertensive emergency.  Per patient, she never missed her dialysis however according to outpatient HD records, it takes at least 4 staff members and [employer] lift for patient to be seated in the dialysis chair.  Symptoms improved with hemodialysis.  Patient underwent R UE venogram on 8/30.  Vascular surgery following for possible RUE HD access.  Patient is now waiting placement where she can get HD on stretcher that has been difficult to find.  Subjective: Seen and examined earlier this morning.  No major events overnight of this morning.  Continues to complain "allergy cold".  Taking Claritin.  Willing to wean oxygen to 2.5 L  Objective: Vitals:   12/19/21 2141 12/20/21 0934 12/20/21 1315 12/20/21 1345  BP: (!) 119/56 122/74 (!) 143/57 (!) 140/55  Pulse: 81 79 77 75  Resp: 18 18 19 17   Temp: 98.4 F (36.9 C) 98.1 F (36.7 C) 99.2 F (37.3 C)   TempSrc: Oral Oral Oral   SpO2: 100% 100% 100% 100%  Weight:      Height:        Examination:  GENERAL: No apparent distress.  Nontoxic. HEENT: MMM.  Vision and hearing grossly intact.  NECK: Supple.  No apparent JVD.  RESP:  No IWOB.  Fair aeration bilaterally. CVS:  RRR. Heart sounds normal.  ABD/GI/GU: BS+. Abd soft, NTND.  MSK/EXT: Bilateral AKA. SKIN: no apparent skin lesion or wound NEURO: Awake and alert. Oriented appropriately.  No apparent focal neuro  deficit. PSYCH: Calm. Normal affect.   Procedures:  Hemodialysis  Microbiology summarized: 9/13-full RVP nonreactive. 9/13-COVID-19 PCR nonreactive.  Assessment and plan: Principal Problem:   End stage renal disease ([place]) Active Problems:   Hypertension   Decubitus ulcer of sacral region, stage 2 (HCC)   Type 2 diabetes mellitus with hyperlipidemia (HCC)   Chronic respiratory failure with hypoxia (HCC)   Bacteremia   PVD (peripheral vascular disease) (HCC)   Class 3 obesity (HCC)   Anemia due to chronic kidney disease   Hypertensive emergency   Leukocytosis   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HCC)   Pressure injury of skin   Acute on chronic diastolic CHF (congestive heart failure) (HCC)   PAD (peripheral artery disease) (HCC)   Irritable bowel syndrome (IBS)   Amputation of left middle finger   GERD (gastroesophageal reflux disease)  ESRD on HD -Reportedly needed 4 people and [place] lift outpatient HD -Per vascular, patient postponed  aVF placement due to cold-like symptoms -Not able to find stretcher dialysis location.  Final plan on her HD unclear.   Chronic hypoxic respiratory failure: 3 L at baseline.  Saturating at 100% on 3 L.  Recommended weaning oxygen as able.  Agrees to try 2.5 L  Acute on chronic diastolic CHF/volume overload/hypertensive emergency -Nephrology managing volume with HD -Imdur and metoprolol on hold due to hypotension -Continue midodrine   Hypotension: Resolved with midodrine. -Continue midodrine   Anemia of CKD: H&H relatively  stable. Recent Labs    12/07/21 1555 12/09/21 0254 12/10/21 2058 12/12/21 0627 12/12/21 0823 12/13/21 0839 12/14/21 0827 12/15/21 0313 12/16/21 1554 12/19/21 0309  HGB 7.3* 8.4* 7.8* 7.8* 7.2* 7.6* 7.1* 7.3* 7.2* 7.5*  -Could benefit from a unit of blood -IV iron weekly per nephrology.   Controlled IDDM-2: A1c 5.3%.  Discontinued CBG monitoring and SSI.  Recent septic shock secondary to  Bacteroides, bacteremia and liver, and splenic abscess-completed antibiotic course.   Leukocytosis: Seems chronic but improved. -Monitor intermittently   PAD s/p bilateral AKA -Continue aspirin and statin. -Vascular surgery following. Continue aspirin.   Hyperlipidemia -Statin.    Hypomagnesemia/hypokalemia/hyponatremia/hypophosphatemia -Per nephrology   Irritable bowel syndrome with constipation. Intractable nausea and vomiting -Continue Amitiza.  Sneezing, headache and fatigue: COVID-19 and full RVP nonreactive.  Allergy?  Oxygen can contribute. -Continue Claritin -Wean oxygen  Morbid obesity Body mass index is 45.01 kg/m.   Right hip unstageable pressure injury. Left flank stage II pressure ulcer. Pressure Injury 11/12/21 Hip Right Unstageable - Full thickness tissue loss in which the base of the injury is covered by slough (yellow, tan, gray, green or brown) and/or eschar (tan, brown or black) in the wound bed. 90% covered with slough (Active)  11/12/21 0800  Location: Hip  Location Orientation: Right  Staging: Unstageable - Full thickness tissue loss in which the base of the injury is covered by slough (yellow, tan, gray, green or brown) and/or eschar (tan, brown or black) in the wound bed.  Wound Description (Comments): 90% covered with slough  Present on Admission: Yes (per RN)  Dressing Type Gauze (Comment);Silver hydrofiber 12/19/21 2000     Pressure Injury 11/24/21 Flank Left;Lower Stage 2 -  Partial thickness loss of dermis presenting as a shallow open injury with a red, pink wound bed without slough. (Active)  11/24/21 0430  Location: Flank  Location Orientation: Left;Lower  Staging: Stage 2 -  Partial thickness loss of dermis presenting as a shallow open injury with a red, pink wound bed without slough.  Wound Description (Comments):   Present on Admission: No  Dressing Type Foam - Lift dressing to assess site every shift 12/19/21 0900   DVT prophylaxis:   heparin injection 5,000 Units Start: 12/01/21 1400  Code Status: Full code Family Communication: None at bedside Level of care: Med-Surg Status is: Inpatient Remains inpatient appropriate because: Lack of safe disposition.  Patient needs HD on a stretcher   Final disposition: SNF Consultants:  Nephrology Vascular surgery  Sch Meds:  Scheduled Meds:  aspirin EC  81 mg Oral Daily   atorvastatin  10 mg Oral Daily   darbepoetin (ARANESP) injection - DIALYSIS  200 mcg Intravenous Q Mon-HD   docusate sodium  100 mg Oral BID   feeding supplement  1 Container Oral TID BM   gabapentin  100 mg Oral Q1200   gabapentin  200 mg Oral QHS   heparin  5,000 Units Subcutaneous Q8H   levothyroxine  100 mcg Oral Q0600   loratadine  10 mg Oral Daily   lubiprostone  8 mcg Oral Q Tue   And   lubiprostone  8 mcg Oral Q Sat   midodrine  10 mg Oral Q M,W,F-HD   pantoprazole  40 mg Oral Daily   sevelamer carbonate  2.4 g Oral TID WC   Continuous Infusions: PRN Meds:.acetaminophen, diphenhydrAMINE, docusate sodium, heparin sodium (porcine), HYDROmorphone, ondansetron (ZOFRAN) IV **OR** ondansetron, mouth rinse, oxyCODONE, prochlorperazine, simethicone  Antimicrobials: Anti-infectives (From admission, onward)  Start     Dose/Rate Route Frequency Ordered Stop   11/28/21 1215  vancomycin (VANCOCIN) IVPB 1000 mg/200 mL premix        1,000 mg 200 mL/hr over 60 Minutes Intravenous  Once 11/28/21 1117 11/28/21 1512   11/26/21 1000  vancomycin (VANCOCIN) IVPB 1000 mg/200 mL premix        1,000 mg 200 mL/hr over 60 Minutes Intravenous On call 11/25/21 1628 11/26/21 1740   11/04/21 1800  ceFEPIme (MAXIPIME) 2 g in sodium chloride 0.9 % 100 mL IVPB        2 g 200 mL/hr over 30 Minutes Intravenous Every M-W-F (1800) 11/03/21 0952 11/08/21 2359   11/04/21 1200  ceFEPIme (MAXIPIME) 2 g in sodium chloride 0.9 % 100 mL IVPB  Status:  Discontinued        2 g 200 mL/hr over 30 Minutes Intravenous Every  M-W-F (Hemodialysis) 11/02/21 0941 11/03/21 0952   11/02/21 1800  ceFEPIme (MAXIPIME) 2 g in sodium chloride 0.9 % 100 mL IVPB        2 g 200 mL/hr over 30 Minutes Intravenous  Once 11/02/21 0941 11/02/21 1812   11/02/21 1200  ceFEPIme (MAXIPIME) 2 g in sodium chloride 0.9 % 100 mL IVPB  Status:  Discontinued        2 g 200 mL/hr over 30 Minutes Intravenous Every T-Th-Sa (Hemodialysis) 11/01/21 1934 11/02/21 0941   11/01/21 2200  metroNIDAZOLE (FLAGYL) tablet 500 mg        500 mg Oral Every 12 hours 11/01/21 1827 11/08/21 2225   11/01/21 1945  ceFEPIme (MAXIPIME) 1 g in sodium chloride 0.9 % 100 mL IVPB        1 g 200 mL/hr over 30 Minutes Intravenous  Once 11/01/21 1933 11/01/21 2032   11/01/21 1730  ceFEPIme (MAXIPIME) 1 g in sodium chloride 0.9 % 100 mL IVPB  Status:  Discontinued        1 g 200 mL/hr over 30 Minutes Intravenous Every 24 hours 11/01/21 1720 11/01/21 1933        I have personally reviewed the following labs and images: CBC: Recent Labs  Lab 12/14/21 0827 12/15/21 0313 12/16/21 1554 12/19/21 0309  WBC 14.0* 14.4* 12.7* 14.2*  NEUTROABS  --  9.8*  --   --   HGB 7.1* 7.3* 7.2* 7.5*  HCT 24.3* 23.7* 23.8* 24.8*  MCV 102.1* 99.2 99.6 99.2  PLT 284 254 282 267   BMP &GFR Recent Labs  Lab 12/14/21 0827 12/15/21 0313 12/16/21 1554 12/19/21 0309  NA 135 134* 135 138  K 4.8 5.0 5.5* 4.0  CL 98 97* 98 99  CO2 27 24 24 29   GLUCOSE 125* 94 106* 111*  BUN 29* 32* 47* 21*  CREATININE 6.74* 7.00* 9.51* 5.12*  CALCIUM 9.2 9.4 9.3 9.3  MG  --   --   --  1.6*  PHOS 5.6* 5.0* 6.4* 4.6   Estimated Creatinine Clearance: 17.1 mL/min (A) (by C-G formula based on SCr of 5.12 mg/dL (H)). Liver & Pancreas: Recent Labs  Lab 12/14/21 0827 12/15/21 0313 12/16/21 1554 12/19/21 0309  ALBUMIN 2.3* 2.4* 2.4* 2.5*   No results for input(s): "LIPASE", "AMYLASE" in the last 168 hours. No results for input(s): "AMMONIA" in the last 168 hours. Diabetic: Recent Labs     12/19/21 0309  HGBA1C 5.3   Recent Labs  Lab 12/19/21 0811 12/19/21 1144 12/19/21 1645 12/19/21 2038 12/20/21 0740  GLUCAP 81 124* 120* 123* 87  Cardiac Enzymes: No results for input(s): "CKTOTAL", "CKMB", "CKMBINDEX", "TROPONINI" in the last 168 hours. No results for input(s): "PROBNP" in the last 8760 hours. Coagulation Profile: No results for input(s): "INR", "PROTIME" in the last 168 hours. Thyroid Function Tests: No results for input(s): "TSH", "T4TOTAL", "FREET4", "T3FREE", "THYROIDAB" in the last 72 hours. Lipid Profile: No results for input(s): "CHOL", "HDL", "LDLCALC", "TRIG", "CHOLHDL", "LDLDIRECT" in the last 72 hours. Anemia Panel: Recent Labs    12/19/21 0309  VITAMINB12 869  FOLATE 6.6  FERRITIN 1,287*  TIBC 174*  IRON 28  RETICCTPCT 3.7*   Urine analysis: No results found for: "COLORURINE", "APPEARANCEUR", "LABSPEC", "PHURINE", "GLUCOSEU", "HGBUR", "BILIRUBINUR", "KETONESUR", "PROTEINUR", "UROBILINOGEN", "NITRITE", "LEUKOCYTESUR" Sepsis Labs: Invalid input(s): "PROCALCITONIN", "LACTICIDVEN"  Microbiology: Recent Results (from the past 240 hour(s))  Respiratory (~20 pathogens) panel by PCR     Status: None   Collection Time: 12/18/21  8:46 AM   Specimen: Nasopharyngeal Swab; Respiratory  Result Value Ref Range Status   Adenovirus NOT DETECTED NOT DETECTED Final   Coronavirus 229E NOT DETECTED NOT DETECTED Final    Comment: (NOTE) The Coronavirus on the Respiratory Panel, DOES NOT test for the novel  Coronavirus (2019 nCoV)    Coronavirus HKU1 NOT DETECTED NOT DETECTED Final   Coronavirus NL63 NOT DETECTED NOT DETECTED Final   Coronavirus OC43 NOT DETECTED NOT DETECTED Final   Metapneumovirus NOT DETECTED NOT DETECTED Final   Rhinovirus / Enterovirus NOT DETECTED NOT DETECTED Final   Influenza A NOT DETECTED NOT DETECTED Final   Influenza B NOT DETECTED NOT DETECTED Final   Parainfluenza Virus 1 NOT DETECTED NOT DETECTED Final   Parainfluenza  Virus 2 NOT DETECTED NOT DETECTED Final   Parainfluenza Virus 3 NOT DETECTED NOT DETECTED Final   Parainfluenza Virus 4 NOT DETECTED NOT DETECTED Final   Respiratory Syncytial Virus NOT DETECTED NOT DETECTED Final   Bordetella pertussis NOT DETECTED NOT DETECTED Final   Bordetella Parapertussis NOT DETECTED NOT DETECTED Final   Chlamydophila pneumoniae NOT DETECTED NOT DETECTED Final   Mycoplasma pneumoniae NOT DETECTED NOT DETECTED Final    Comment: Performed at [facility], [place]. [address] [place], [place] [address]  SARS Coronavirus 2 by RT PCR (hospital order, performed in [facility] hospital lab) *cepheid single result test*     Status: None   Collection Time: 12/18/21  8:46 AM  Result Value Ref Range Status   SARS Coronavirus 2 by RT PCR NEGATIVE NEGATIVE Final    Comment: (NOTE) SARS-CoV-2 target nucleic acids are NOT DETECTED.  The SARS-CoV-2 RNA is generally detectable in upper and lower respiratory specimens during the acute phase of infection. The lowest concentration of SARS-CoV-2 viral copies this assay can detect is 250 copies / mL. A negative result does not preclude SARS-CoV-2 infection and should not be used as the sole basis for treatment or other patient management decisions.  A negative result may occur with improper specimen collection / handling, submission of specimen other than nasopharyngeal swab, presence of viral mutation(s) within the areas targeted by this assay, and inadequate number of viral copies (<250 copies / mL). A negative result must be combined with clinical observations, patient history, and epidemiological information.  Fact Sheet for Patients:   https://www.patel.info/  Fact Sheet for Healthcare Providers: https://hall.com/  This test is not yet approved or  cleared by the [place] FDA and has been authorized for detection and/or diagnosis of SARS-CoV-2 by FDA under an Emergency Use  Authorization (EUA).  This EUA  will remain in effect (meaning this test can be used) for the duration of the COVID-19 declaration under Section 564(b)(1) of the Act, 21 U.S.C. section 360bbb-3(b)(1), unless the authorization is terminated or revoked sooner.  Performed at [facility], [place] [address], [place], [place] [address]     Radiology Studies: No results found.    Kambria Grima T[address] [place]  If 7PM-7AM, please contact night-coverage [email or web address] 12/20/2021, 2:12 PM

## 2021-12-20 NOTE — Progress Notes (Signed)
Received patient in bed to unit.  Alert and oriented.  Informed consent signed and in chart.   Treatment initiated: 0071 Treatment completed: 1732  Patient tolerated well.  Transported back to the room  Alert, without acute distress.  Hand-off given to patient's nurse.   Access used: HD Catheter Access issues: none  Total UF removed: 1900 Medication(s) given: Heparin bolus 2500 Post HD VS: 97.9, 66, 20, 134/43, 100% on 3L Post HD weight: Unable to obtain   [facility]

## 2021-12-20 NOTE — Progress Notes (Signed)
[facility] Progress Note   Subjective:  Seen in room. Tired this am, doesn't "feel good" with cold symptoms. Denies chest pain/dyspnea. For dialysis today.   Objective Vitals:   12/19/21 0640 12/19/21 0859 12/19/21 1746 12/19/21 2141  BP: (!) 140/53 (!) 142/49 (!) 137/55 (!) 119/56  Pulse: 81 81 79 81  Resp: 18 20 19 18   Temp: 98.3 F (36.8 C) 98.1 F (36.7 C) 98.4 F (36.9 C) 98.4 F (36.9 C)  TempSrc: Oral Oral  Oral  SpO2: 100% 100% 100% 100%  Weight:      Height:       Physical Exam General: Well-appearing, obese woman, NAD. Nasal O2 in place. Heart: RRR; no murmur Lungs: CTA anteriorly Abdomen: soft Extremities: B AKA, no stump edema Dialysis Access: [facility]  Additional Objective Labs: Basic Metabolic Panel: Recent Labs  Lab 12/15/21 0313 12/16/21 1554 12/19/21 0309  NA 134* 135 138  K 5.0 5.5* 4.0  CL 97* 98 99  CO2 24 24 29   GLUCOSE 94 106* 111*  BUN 32* 47* 21*  CREATININE 7.00* 9.51* 5.12*  CALCIUM 9.4 9.3 9.3  PHOS 5.0* 6.4* 4.6    Liver Function Tests: Recent Labs  Lab 12/15/21 0313 12/16/21 1554 12/19/21 0309  ALBUMIN 2.4* 2.4* 2.5*    CBC: Recent Labs  Lab 12/14/21 0827 12/15/21 0313 12/16/21 1554 12/19/21 0309  WBC 14.0* 14.4* 12.7* 14.2*  NEUTROABS  --  9.8*  --   --   HGB 7.1* 7.3* 7.2* 7.5*  HCT 24.3* 23.7* 23.8* 24.8*  MCV 102.1* 99.2 99.6 99.2  PLT 284 254 282 267    Medications:   aspirin EC  81 mg Oral Daily   atorvastatin  10 mg Oral Daily   darbepoetin (ARANESP) injection - DIALYSIS  200 mcg Intravenous Q Mon-HD   docusate sodium  100 mg Oral BID   feeding supplement  1 Container Oral TID BM   gabapentin  100 mg Oral Q1200   gabapentin  200 mg Oral QHS   heparin  5,000 Units Subcutaneous Q8H   levothyroxine  100 mcg Oral Q0600   loratadine  10 mg Oral Daily   lubiprostone  8 mcg Oral Q Tue   And   lubiprostone  8 mcg Oral Q Sat   midodrine  10 mg Oral Q M,W,F-HD   pantoprazole  40 mg Oral  Daily   sevelamer carbonate  2.4 g Oral TID WC    Dialysis Orders: MWF - [facility]  4hrs, BFR 400, DFR 600,  AVF, EDW 146kg, 3K/ 2Ca - last hep B labs 8/21 - Heparin 2500 units IV with HD - Hectorol 46mcg IV q HD, held as of 10/30/21   Assessment/Plan: ESRD: Was off schedule last week d/t TDC issues, back to MWF schedule this week - next HD today [facility] malfunction: Had required two [facility] exchanges and having issues again on 9/4, got overnight tPA dwell. IR thinks TDC is positional as no fibrin on tip at last exchange. Discussed the need for a long term solution, VVS consulted - plan was for RUE AVF this week (12/19/21)  but was postponed. Heparin boluses restarted with dialysis. BP/volume: BP variable, was on low dose metoprolol and midodine -> metop discontinued on 9/1. Last hoyer weight was 125kg which is down significantly from OP EDW -> lower on discharge. BP dropped pretty significantly with HD 9/11 - midodrine was given too early, will work on timing with midodrine today. BP fine with HD on  9/13. Chronic respiratory failure with hypoxia on cannula oxygen at home: on 5L O2 at baseline. Anemia of ESRD: Hgb 7.5 - continue high dose ESA q Monday while here. Transfuse < 7. Secondary hyperparathyroidism: CorrCa high, VDRA on hold. Renvela increased - Phos better. 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 Hypokalemia: Recurrent issue recently and dislikes Klor-Con tabs. Was using high K bath with HD -> back to 2K usual. Severe sacral decubitus  wound: Per primary/wound care team. Disposition: Difficult situation. Prior to admit, patient required 6 staff members and 2 hoyer pads for transfer into HD chair as OP. Could not stay on dialysis for full treatment because of pain of multiple decubitus ulcers and PTAR unable to lift pt into ambulance for transport. SW has been unable to locate stretcher dialysis location so far. Her weight is much lower from admit, asked PT  about trying to hoyer to recliner for HD -> apparently would need hoyer that wraps around her legs and there is concern for worsening her chronic wounds - unclear path at this time. Continues to need SNF on discharge.   [name] PA-C [facility] 12/20/2021,8:55 AM

## 2021-12-21 DIAGNOSIS — D72829 Elevated white blood cell count, unspecified: Secondary | ICD-10-CM | POA: Diagnosis not present

## 2021-12-21 DIAGNOSIS — Z89611 Acquired absence of right leg above knee: Secondary | ICD-10-CM | POA: Diagnosis not present

## 2021-12-21 DIAGNOSIS — N186 End stage renal disease: Secondary | ICD-10-CM | POA: Diagnosis not present

## 2021-12-21 DIAGNOSIS — I739 Peripheral vascular disease, unspecified: Secondary | ICD-10-CM | POA: Diagnosis not present

## 2021-12-21 NOTE — Progress Notes (Signed)
PROGRESS NOTE  Courtney Gray NTI:[id number] DOB: 01-Dec-1970   PCP: Monico Blitz, MD  Patient is from: ???  DOA: 11/01/2021 LOS: 68  Chief complaints Chief Complaint  Patient presents with   Vascular Access Problem     Brief Narrative / Interim history: 51 year old F with PMH of ESRD on HD TTS, recent hospitalization for bacteremia, hepatic and splenic abscess on long-term IV antibiotics, diastolic CHF, PVD s/p bilateral AKA, IDDM-2, morbid obesity and chronic sacral wound presenting with progressive shortness of breath in the setting of missed dialysis and admitted for hypertensive emergency.  Per patient, she never missed her dialysis however according to outpatient HD records, it takes at least 4 staff members and [facility] lift for patient to be seated in the dialysis chair.  Symptoms improved with hemodialysis.  Patient underwent R UE venogram on 8/30.  Vascular surgery following for possible RUE HD access.  Patient is now waiting placement where she can get HD on stretcher that has been difficult to find.  Subjective: Seen and examined earlier this morning.  No major events overnight of this morning.  Lying in bed in Trendelenburg position due to pain in his stump from the mattress.  She states Bedway does not work and the staff not able to monitor her weight.  Still with some "allergy cold".  Objective: Vitals:   12/20/21 1715 12/20/21 1732 12/20/21 2135 12/21/21 0605  BP: (!) 154/53 (!) 134/43 (!) 120/45 (!) 123/43  Pulse: 68 66 78 76  Resp: 19 20 18 18   Temp:  97.9 F (36.6 C) 98.9 F (37.2 C) 98.1 F (36.7 C)  TempSrc:  Oral Oral Oral  SpO2: 100% 100% 100% 100%  Weight:      Height:        Examination:  GENERAL: No apparent distress.  Lying in Trendelenburg position. HEENT: MMM.  Vision and hearing grossly intact.  NECK: Supple.  No apparent JVD.  RESP:  No IWOB.  Fair aeration bilaterally. CVS:  RRR. Heart sounds normal.  ABD/GI/GU: BS+. Abd soft, NTND.  MSK/EXT:  Bilateral AKA. SKIN: Pressure skin injury as below. NEURO: Awake and alert. Oriented appropriately.  No apparent focal neuro deficit. PSYCH: Calm. Normal affect.   Procedures:  Hemodialysis  Microbiology summarized: 9/13-full RVP nonreactive. 9/13-COVID-19 PCR nonreactive.  Assessment and plan: Principal Problem:   End stage renal disease ([place]) Active Problems:   Hypertension   Decubitus ulcer of sacral region, stage 2 (HCC)   Type 2 diabetes mellitus with hyperlipidemia (HCC)   Chronic respiratory failure with hypoxia (HCC)   Bacteremia   PVD (peripheral vascular disease) (HCC)   Class 3 obesity (HCC)   Anemia due to chronic kidney disease   Hypertensive emergency   Leukocytosis   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HCC)   Pressure injury of skin   Acute on chronic diastolic CHF (congestive heart failure) (HCC)   PAD (peripheral artery disease) (HCC)   Irritable bowel syndrome (IBS)   Amputation of left middle finger   GERD (gastroesophageal reflux disease)  ESRD on HD -Reportedly needed 4 people and [place] lift outpatient HD -Per vascular, patient postponed  aVF placement due to cold-like symptoms -Not able to find stretcher dialysis location.  Final plan on her HD unclear.   Chronic hypoxic respiratory failure: 3 L at baseline.  Saturating at 100% on 3 L.  Recommended weaning oxygen as able.  Agrees to try 2.5 L -Weaned oxygen to 2.5 L.  Acute on chronic diastolic CHF/volume overload/hypertensive emergency -Nephrology  managing volume with HD -Imdur and metoprolol on hold due to hypotension -Continue midodrine   Hypotension: Resolved with midodrine. -Continue midodrine   Anemia of CKD: H&H relatively stable. Recent Labs    12/07/21 1555 12/09/21 0254 12/10/21 2058 12/12/21 0627 12/12/21 0823 12/13/21 0839 12/14/21 0827 12/15/21 0313 12/16/21 1554 12/19/21 0309  HGB 7.3* 8.4* 7.8* 7.8* 7.2* 7.6* 7.1* 7.3* 7.2* 7.5*  -Could benefit from a unit of  blood -IV iron per nephrology.   Controlled IDDM-2: A1c 5.3%.  Discontinued CBG monitoring and SSI.  Recent septic shock secondary to Bacteroides, bacteremia and liver, and splenic abscess-completed antibiotic course.   Leukocytosis: Seems chronic but improved. -Monitor intermittently   PAD s/p bilateral AKA -Continue aspirin and statin. -Requested RN to replace mattress and bed   Hyperlipidemia -Statin.    Hypomagnesemia/hypokalemia/hyponatremia/hypophosphatemia -Per nephrology   Irritable bowel syndrome with constipation. Intractable nausea and vomiting -Continue Amitiza.  Sneezing, headache and fatigue: COVID-19 and full RVP nonreactive.  Allergy?  Oxygen can contribute. -Continue Claritin -Wean oxygen  Morbid obesity Body mass index is 45.01 kg/m.   Right hip unstageable pressure injury. Left flank stage II pressure ulcer. Pressure Injury 11/12/21 Hip Right Unstageable - Full thickness tissue loss in which the base of the injury is covered by slough (yellow, tan, gray, green or brown) and/or eschar (tan, brown or black) in the wound bed. 90% covered with slough (Active)  11/12/21 0800  Location: Hip  Location Orientation: Right  Staging: Unstageable - Full thickness tissue loss in which the base of the injury is covered by slough (yellow, tan, gray, green or brown) and/or eschar (tan, brown or black) in the wound bed.  Wound Description (Comments): 90% covered with slough  Present on Admission: Yes (per RN)  Dressing Type Foam - Lift dressing to assess site every shift 12/21/21 0930     Pressure Injury 11/24/21 Flank Left;Lower Stage 2 -  Partial thickness loss of dermis presenting as a shallow open injury with a red, pink wound bed without slough. (Active)  11/24/21 0430  Location: Flank  Location Orientation: Left;Lower  Staging: Stage 2 -  Partial thickness loss of dermis presenting as a shallow open injury with a red, pink wound bed without slough.  Wound  Description (Comments):   Present on Admission: No  Dressing Type Foam - Lift dressing to assess site every shift 12/21/21 0930   DVT prophylaxis:  heparin injection 5,000 Units Start: 12/01/21 1400  Code Status: Full code Family Communication: None at bedside Level of care: Med-Surg Status is: Inpatient Remains inpatient appropriate because: Lack of safe disposition.  Patient needs HD on a stretcher   Final disposition: SNF Consultants:  Nephrology Vascular surgery  Sch Meds:  Scheduled Meds:  aspirin EC  81 mg Oral Daily   atorvastatin  10 mg Oral Daily   darbepoetin (ARANESP) injection - DIALYSIS  200 mcg Intravenous Q Mon-HD   docusate sodium  100 mg Oral BID   feeding supplement  1 Container Oral TID BM   gabapentin  100 mg Oral Q1200   gabapentin  200 mg Oral QHS   heparin  5,000 Units Subcutaneous Q8H   levothyroxine  100 mcg Oral Q0600   loratadine  10 mg Oral Daily   lubiprostone  8 mcg Oral Q Tue   And   lubiprostone  8 mcg Oral Q Sat   midodrine  10 mg Oral Q M,W,F-HD   pantoprazole  40 mg Oral Daily   sevelamer carbonate  2.4  g Oral TID WC   Continuous Infusions: PRN Meds:.acetaminophen, diphenhydrAMINE, docusate sodium, heparin sodium (porcine), HYDROmorphone, ondansetron (ZOFRAN) IV **OR** ondansetron, mouth rinse, oxyCODONE, prochlorperazine, simethicone  Antimicrobials: Anti-infectives (From admission, onward)    Start     Dose/Rate Route Frequency Ordered Stop   11/28/21 1215  vancomycin (VANCOCIN) IVPB 1000 mg/200 mL premix        1,000 mg 200 mL/hr over 60 Minutes Intravenous  Once 11/28/21 1117 11/28/21 1512   11/26/21 1000  vancomycin (VANCOCIN) IVPB 1000 mg/200 mL premix        1,000 mg 200 mL/hr over 60 Minutes Intravenous On call 11/25/21 1628 11/26/21 1740   11/04/21 1800  ceFEPIme (MAXIPIME) 2 g in sodium chloride 0.9 % 100 mL IVPB        2 g 200 mL/hr over 30 Minutes Intravenous Every M-W-F (1800) 11/03/21 0952 11/08/21 2359   11/04/21  1200  ceFEPIme (MAXIPIME) 2 g in sodium chloride 0.9 % 100 mL IVPB  Status:  Discontinued        2 g 200 mL/hr over 30 Minutes Intravenous Every M-W-F (Hemodialysis) 11/02/21 0941 11/03/21 0952   11/02/21 1800  ceFEPIme (MAXIPIME) 2 g in sodium chloride 0.9 % 100 mL IVPB        2 g 200 mL/hr over 30 Minutes Intravenous  Once 11/02/21 0941 11/02/21 1812   11/02/21 1200  ceFEPIme (MAXIPIME) 2 g in sodium chloride 0.9 % 100 mL IVPB  Status:  Discontinued        2 g 200 mL/hr over 30 Minutes Intravenous Every T-Th-Sa (Hemodialysis) 11/01/21 1934 11/02/21 0941   11/01/21 2200  metroNIDAZOLE (FLAGYL) tablet 500 mg        500 mg Oral Every 12 hours 11/01/21 1827 11/08/21 2225   11/01/21 1945  ceFEPIme (MAXIPIME) 1 g in sodium chloride 0.9 % 100 mL IVPB        1 g 200 mL/hr over 30 Minutes Intravenous  Once 11/01/21 1933 11/01/21 2032   11/01/21 1730  ceFEPIme (MAXIPIME) 1 g in sodium chloride 0.9 % 100 mL IVPB  Status:  Discontinued        1 g 200 mL/hr over 30 Minutes Intravenous Every 24 hours 11/01/21 1720 11/01/21 1933        I have personally reviewed the following labs and images: CBC: Recent Labs  Lab 12/15/21 0313 12/16/21 1554 12/19/21 0309  WBC 14.4* 12.7* 14.2*  NEUTROABS 9.8*  --   --   HGB 7.3* 7.2* 7.5*  HCT 23.7* 23.8* 24.8*  MCV 99.2 99.6 99.2  PLT 254 282 267   BMP &GFR Recent Labs  Lab 12/15/21 0313 12/16/21 1554 12/19/21 0309  NA 134* 135 138  K 5.0 5.5* 4.0  CL 97* 98 99  CO2 24 24 29   GLUCOSE 94 106* 111*  BUN 32* 47* 21*  CREATININE 7.00* 9.51* 5.12*  CALCIUM 9.4 9.3 9.3  MG  --   --  1.6*  PHOS 5.0* 6.4* 4.6   Estimated Creatinine Clearance: 17.1 mL/min (A) (by C-G formula based on SCr of 5.12 mg/dL (H)). Liver & Pancreas: Recent Labs  Lab 12/15/21 0313 12/16/21 1554 12/19/21 0309  ALBUMIN 2.4* 2.4* 2.5*   No results for input(s): "LIPASE", "AMYLASE" in the last 168 hours. No results for input(s): "AMMONIA" in the last 168  hours. Diabetic: Recent Labs    12/19/21 0309  HGBA1C 5.3   Recent Labs  Lab 12/19/21 0811 12/19/21 1144 12/19/21 1645 12/19/21 2038 12/20/21 0740  GLUCAP 81 124* 120* 123* 87   Cardiac Enzymes: No results for input(s): "CKTOTAL", "CKMB", "CKMBINDEX", "TROPONINI" in the last 168 hours. No results for input(s): "PROBNP" in the last 8760 hours. Coagulation Profile: No results for input(s): "INR", "PROTIME" in the last 168 hours. Thyroid Function Tests: No results for input(s): "TSH", "T4TOTAL", "FREET4", "T3FREE", "THYROIDAB" in the last 72 hours. Lipid Profile: No results for input(s): "CHOL", "HDL", "LDLCALC", "TRIG", "CHOLHDL", "LDLDIRECT" in the last 72 hours. Anemia Panel: Recent Labs    12/19/21 0309  VITAMINB12 869  FOLATE 6.6  FERRITIN 1,287*  TIBC 174*  IRON 28  RETICCTPCT 3.7*   Urine analysis: No results found for: "COLORURINE", "APPEARANCEUR", "LABSPEC", "PHURINE", "GLUCOSEU", "HGBUR", "BILIRUBINUR", "KETONESUR", "PROTEINUR", "UROBILINOGEN", "NITRITE", "LEUKOCYTESUR" Sepsis Labs: Invalid input(s): "PROCALCITONIN", "LACTICIDVEN"  Microbiology: Recent Results (from the past 240 hour(s))  Respiratory (~20 pathogens) panel by PCR     Status: None   Collection Time: 12/18/21  8:46 AM   Specimen: Nasopharyngeal Swab; Respiratory  Result Value Ref Range Status   Adenovirus NOT DETECTED NOT DETECTED Final   Coronavirus 229E NOT DETECTED NOT DETECTED Final    Comment: (NOTE) The Coronavirus on the Respiratory Panel, DOES NOT test for the novel  Coronavirus (2019 nCoV)    Coronavirus HKU1 NOT DETECTED NOT DETECTED Final   Coronavirus NL63 NOT DETECTED NOT DETECTED Final   Coronavirus OC43 NOT DETECTED NOT DETECTED Final   Metapneumovirus NOT DETECTED NOT DETECTED Final   Rhinovirus / Enterovirus NOT DETECTED NOT DETECTED Final   Influenza A NOT DETECTED NOT DETECTED Final   Influenza B NOT DETECTED NOT DETECTED Final   Parainfluenza Virus 1 NOT DETECTED NOT  DETECTED Final   Parainfluenza Virus 2 NOT DETECTED NOT DETECTED Final   Parainfluenza Virus 3 NOT DETECTED NOT DETECTED Final   Parainfluenza Virus 4 NOT DETECTED NOT DETECTED Final   Respiratory Syncytial Virus NOT DETECTED NOT DETECTED Final   Bordetella pertussis NOT DETECTED NOT DETECTED Final   Bordetella Parapertussis NOT DETECTED NOT DETECTED Final   Chlamydophila pneumoniae NOT DETECTED NOT DETECTED Final   Mycoplasma pneumoniae NOT DETECTED NOT DETECTED Final    Comment: Performed at [facility], [place]. [address] [place], [place] [address]  SARS Coronavirus 2 by RT PCR (hospital order, performed in [facility] hospital lab) *cepheid single result test*     Status: None   Collection Time: 12/18/21  8:46 AM  Result Value Ref Range Status   SARS Coronavirus 2 by RT PCR NEGATIVE NEGATIVE Final    Comment: (NOTE) SARS-CoV-2 target nucleic acids are NOT DETECTED.  The SARS-CoV-2 RNA is generally detectable in upper and lower respiratory specimens during the acute phase of infection. The lowest concentration of SARS-CoV-2 viral copies this assay can detect is 250 copies / mL. A negative result does not preclude SARS-CoV-2 infection and should not be used as the sole basis for treatment or other patient management decisions.  A negative result may occur with improper specimen collection / handling, submission of specimen other than nasopharyngeal swab, presence of viral mutation(s) within the areas targeted by this assay, and inadequate number of viral copies (<250 copies / mL). A negative result must be combined with clinical observations, patient history, and epidemiological information.  Fact Sheet for Patients:   https://www.patel.info/  Fact Sheet for Healthcare Providers: https://hall.com/  This test is not yet approved or  cleared by the [place] FDA and has been authorized for detection and/or diagnosis of SARS-CoV-2  by FDA under  an Emergency Use Authorization (EUA).  This EUA will remain in effect (meaning this test can be used) for the duration of the COVID-19 declaration under Section 564(b)(1) of the Act, 21 U.S.C. section 360bbb-3(b)(1), unless the authorization is terminated or revoked sooner.  Performed at [facility], [place] [address], [place], [place] [address]     Radiology Studies: No results found.    Nikolina Simerson T. [place]  If 7PM-7AM, please contact night-coverage [email or web address] 12/21/2021, 1:34 PM

## 2021-12-21 NOTE — Progress Notes (Signed)
Wound care and wound dressing  is done by this nurse. Pack wound with Aquacel  AG, and with Kerlix or gauze.However pt,.insisted  to do dressing done not according to the order but cover the dressing with Maplex border(foam )on top of abd.This nurse tried to explain to best of my knowledge.

## 2021-12-21 NOTE — Progress Notes (Signed)
Put in IV team consult for ultrasound IV.  IV team felt she was not getting any meds IV and would wait till she needed IV medications.  In part due to veins going bad over time for dialysis patient.

## 2021-12-21 NOTE — Progress Notes (Signed)
[facility] Progress Note   Subjective:  Seen in room. Reports issues with dialyzer clotting at end of treatment yesterday. Otherwise no new concerns. Still with some congestion type symptoms. Denies cp/dyspnea.    Objective Vitals:   12/20/21 1715 12/20/21 1732 12/20/21 2135 12/21/21 0605  BP: (!) 154/53 (!) 134/43 (!) 120/45 (!) 123/43  Pulse: 68 66 78 76  Resp: 19 20 18 18   Temp:  33.2 F (95.1 C) 98.9 F (37.2 C) 98.1 F (36.7 C)  TempSrc:  Oral Oral Oral  SpO2: 100% 100% 100% 100%  Weight:      Height:       Physical Exam General: Well-appearing, obese woman, NAD. Nasal O2 in place. Heart: RRR; no murmur Lungs: CTA anteriorly Abdomen: soft Extremities: B AKA, no stump edema Dialysis Access: [facility]  Additional Objective Labs: Basic Metabolic Panel: Recent Labs  Lab 12/15/21 0313 12/16/21 1554 12/19/21 0309  NA 134* 135 138  K 5.0 5.5* 4.0  CL 97* 98 99  CO2 24 24 29   GLUCOSE 94 106* 111*  BUN 32* 47* 21*  CREATININE 7.00* 9.51* 5.12*  CALCIUM 9.4 9.3 9.3  PHOS 5.0* 6.4* 4.6    Liver Function Tests: Recent Labs  Lab 12/15/21 0313 12/16/21 1554 12/19/21 0309  ALBUMIN 2.4* 2.4* 2.5*    CBC: Recent Labs  Lab 12/15/21 0313 12/16/21 1554 12/19/21 0309  WBC 14.4* 12.7* 14.2*  NEUTROABS 9.8*  --   --   HGB 7.3* 7.2* 7.5*  HCT 23.7* 23.8* 24.8*  MCV 99.2 99.6 99.2  PLT 254 282 267    Medications:   aspirin EC  81 mg Oral Daily   atorvastatin  10 mg Oral Daily   darbepoetin (ARANESP) injection - DIALYSIS  200 mcg Intravenous Q Mon-HD   docusate sodium  100 mg Oral BID   feeding supplement  1 Container Oral TID BM   gabapentin  100 mg Oral Q1200   gabapentin  200 mg Oral QHS   heparin  5,000 Units Subcutaneous Q8H   levothyroxine  100 mcg Oral Q0600   loratadine  10 mg Oral Daily   lubiprostone  8 mcg Oral Q Tue   And   lubiprostone  8 mcg Oral Q Sat   midodrine  10 mg Oral Q M,W,F-HD   pantoprazole  40 mg Oral Daily    sevelamer carbonate  2.4 g Oral TID WC    Dialysis Orders: MWF - [facility]  4hrs, BFR 400, DFR 600,  AVF, EDW 146kg, 3K/ 2Ca - last hep B labs 8/21 - Heparin 2500 units IV with HD - Hectorol 3mcg IV q HD, held as of 10/30/21   Assessment/Plan: ESRD: Back to MWF schedule this week - next HD 9/18 [facility] malfunction: Had required two [facility] exchanges and having issues again on 9/4, got overnight tPA dwell. IR thinks TDC is positional as no fibrin on tip at last exchange. Discussed the need for a long term solution, VVS consulted - plan was for RUE AVF this week (12/19/21)  but was postponed. Heparin boluses restarted with dialysis (will add mid treatment) BP/volume: BP variable, was on low dose metoprolol and midodine -> metop discontinued on 9/1. Last hoyer weight was 125kg which is down significantly from OP EDW -> lower on discharge. BP dropped pretty significantly with HD 9/11 - midodrine was given too early. Better now - follow timing with midodrine Chronic respiratory failure with hypoxia on cannula oxygen at home: on 5L O2 at  baseline. Anemia of ESRD: Hgb 7.5 - continue high dose ESA q Monday while here. Transfuse < 7. Secondary hyperparathyroidism: CorrCa high, VDRA on hold. Renvela increased - Phos better. 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 Hypokalemia: Recurrent issue recently and dislikes Klor-Con tabs. Was using high K bath with HD -> back to 2K usual. Severe sacral decubitus  wound: Per primary/wound care team. Disposition: Difficult situation. Prior to admit, patient required 6 staff members and 2 hoyer pads for transfer into HD chair as OP. Could not stay on dialysis for full treatment because of pain of multiple decubitus ulcers and PTAR unable to lift pt into ambulance for transport. SW has been unable to locate stretcher dialysis location so far. Her weight is much lower from admit, asked PT about trying to hoyer to recliner for HD -> apparently  would need hoyer that wraps around her legs and there is concern for worsening her chronic wounds - unclear path at this time. Continues to need SNF on discharge.   [name] PA-C [facility] 12/21/2021,9:19 AM

## 2021-12-21 NOTE — Progress Notes (Signed)
Secure chat with Dellis Filbert RN re PIV.  Not needed at this time.

## 2021-12-21 NOTE — Progress Notes (Signed)
Changed patient air bed out due to scale not working to get weights before and after dialysis.

## 2021-12-22 DIAGNOSIS — Z89611 Acquired absence of right leg above knee: Secondary | ICD-10-CM | POA: Diagnosis not present

## 2021-12-22 DIAGNOSIS — I739 Peripheral vascular disease, unspecified: Secondary | ICD-10-CM | POA: Diagnosis not present

## 2021-12-22 DIAGNOSIS — D72829 Elevated white blood cell count, unspecified: Secondary | ICD-10-CM | POA: Diagnosis not present

## 2021-12-22 DIAGNOSIS — N186 End stage renal disease: Secondary | ICD-10-CM | POA: Diagnosis not present

## 2021-12-22 NOTE — Progress Notes (Signed)
[facility] Progress Note   Subjective:  Seen in room. No new events or concerns today. Has some "allergy" symptoms, more tired than usual. Denies cp/dyspnea.    Objective Vitals:   12/21/21 1637 12/21/21 1733 12/21/21 2137 12/22/21 0606  BP:  (!) 136/46 (!) 151/47 (!) 143/44  Pulse:  75 76 79  Resp:  16 18 17   Temp:  98.9 F (37.2 C) 98.5 F (36.9 C) 98.3 F (36.8 C)  TempSrc:  Oral    SpO2:  100% 99% 100%  Weight: 122.5 kg     Height:       Physical Exam General: Well-appearing, obese woman, NAD. Nasal O2 in place. Heart: RRR; no murmur Lungs: CTA anteriorly Abdomen: soft Extremities: B AKA, no stump edema Dialysis Access: [facility]  Additional Objective Labs: Basic Metabolic Panel: Recent Labs  Lab 12/16/21 1554 12/19/21 0309  NA 135 138  K 5.5* 4.0  CL 98 99  CO2 24 29  GLUCOSE 106* 111*  BUN 47* 21*  CREATININE 9.51* 5.12*  CALCIUM 9.3 9.3  PHOS 6.4* 4.6    Liver Function Tests: Recent Labs  Lab 12/16/21 1554 12/19/21 0309  ALBUMIN 2.4* 2.5*    CBC: Recent Labs  Lab 12/16/21 1554 12/19/21 0309  WBC 12.7* 14.2*  HGB 7.2* 7.5*  HCT 23.8* 24.8*  MCV 99.6 99.2  PLT 282 267    Medications:   aspirin EC  81 mg Oral Daily   atorvastatin  10 mg Oral Daily   darbepoetin (ARANESP) injection - DIALYSIS  200 mcg Intravenous Q Mon-HD   docusate sodium  100 mg Oral BID   feeding supplement  1 Container Oral TID BM   gabapentin  100 mg Oral Q1200   gabapentin  200 mg Oral QHS   heparin  5,000 Units Subcutaneous Q8H   levothyroxine  100 mcg Oral Q0600   loratadine  10 mg Oral Daily   lubiprostone  8 mcg Oral Q Tue   And   lubiprostone  8 mcg Oral Q Sat   midodrine  10 mg Oral Q M,W,F-HD   pantoprazole  40 mg Oral Daily   sevelamer carbonate  2.4 g Oral TID WC    Dialysis Orders: MWF - [facility]  4hrs, BFR 400, DFR 600,  AVF, EDW 146kg, 3K/ 2Ca - last hep B labs 8/21 - Heparin 2500 units IV with HD - Hectorol  87mcg IV q HD, held as of 10/30/21   Assessment/Plan: ESRD: Back to MWF schedule this week - next HD 9/18 [facility] malfunction: Had required two [facility] exchanges and having issues again on 9/4, got overnight tPA dwell. IR thinks TDC is positional as no fibrin on tip at last exchange. Discussed the need for a long term solution, VVS consulted - plan was for RUE AVF this week (12/19/21)  but was postponed. Heparin boluses restarted with dialysis (will add mid treatment) BP/volume: BP variable, was on low dose metoprolol and midodine -> metop discontinued on 9/1. Last hoyer weight was 125kg which is down significantly from OP EDW -> lower on discharge. BP dropped pretty significantly with HD 9/11 - midodrine was given too early. Better now - follow timing with midodrine Chronic respiratory failure with hypoxia on cannula oxygen at home: on 5L O2 at baseline. Anemia of ESRD: Hgb 7.5 - continue high dose ESA q Monday while here. Transfuse < 7. Secondary hyperparathyroidism: CorrCa high, VDRA on hold. Renvela increased - Phos better. PAD s/p bilateral AKA, s/p L  3rd finger amputation for gangrene. Hypokalemia: Recurrent issue recently and dislikes Klor-Con tabs. Was using high K bath with HD -> back to 2K usual. Severe sacral decubitus  wound: Per primary/wound care team. Disposition: Difficult situation. Prior to admit, patient required 6 staff members and 2 hoyer pads for transfer into HD chair as OP. Could not stay on dialysis for full treatment because of pain of multiple decubitus ulcers and PTAR unable to lift pt into ambulance for transport. SW has been unable to locate stretcher dialysis location so far. Her weight is much lower from admit, asked PT about trying to hoyer to recliner for HD -> apparently would need hoyer that wraps around her legs and there is concern for worsening her chronic wounds - unclear path at this time. Continues to need SNF on discharge.   [name] PA-C [facility] 12/22/2021,10:08 AM

## 2021-12-22 NOTE — Progress Notes (Signed)
PROGRESS NOTE  Courtney Gray PIR:[id number] DOB: May 08, 1970   PCP: Monico Blitz, MD  Patient is from: ???  DOA: 11/01/2021 LOS: 43  Chief complaints Chief Complaint  Patient presents with   Vascular Access Problem     Brief Narrative / Interim history: 51 year old F with PMH of ESRD on HD TTS, recent hospitalization for bacteremia, hepatic and splenic abscess on long-term IV antibiotics, diastolic CHF, PVD s/p bilateral AKA, IDDM-2, morbid obesity and chronic sacral wound presenting with progressive shortness of breath in the setting of missed dialysis and admitted for hypertensive emergency.  Per patient, she never missed her dialysis however according to outpatient HD records, it takes at least 4 staff members and [facility] lift for patient to be seated in the dialysis chair.  Symptoms improved with hemodialysis.  Patient underwent R UE venogram on 8/30.  Vascular surgery following for possible RUE HD access.  Patient is now waiting placement where she can get HD on stretcher that has been difficult to find.  Subjective: Seen and examined earlier this morning.  No major events overnight of this morning.  No complaints but likes to sleep.   Objective: Vitals:   12/21/21 1637 12/21/21 1733 12/21/21 2137 12/22/21 0606  BP:  (!) 136/46 (!) 151/47 (!) 143/44  Pulse:  75 76 79  Resp:  16 18 17   Temp:  98.9 F (37.2 C) 98.5 F (36.9 C) 98.3 F (36.8 C)  TempSrc:  Oral    SpO2:  100% 99% 100%  Weight: 122.5 kg     Height:        Examination:   GENERAL: No apparent distress.  Nontoxic. HEENT: MMM.  Vision and hearing grossly intact.  NECK: Supple.  No apparent JVD.  RESP:  No IWOB.  Fair aeration bilaterally. CVS:  RRR. Heart sounds normal.  ABD/GI/GU: BS+. Abd soft, NTND.  MSK/EXT: Bilateral AKA. SKIN: no apparent skin lesion or wound NEURO: Awake and alert. Oriented appropriately.  No apparent focal neuro deficit. PSYCH: Calm. Normal affect.   Procedures:   Hemodialysis  Microbiology summarized: 9/13-full RVP nonreactive. 9/13-COVID-19 PCR nonreactive.  Assessment and plan: Principal Problem:   End stage renal disease ([place]) Active Problems:   Hypertension   Decubitus ulcer of sacral region, stage 2 (HCC)   Type 2 diabetes mellitus with hyperlipidemia (HCC)   Chronic respiratory failure with hypoxia (HCC)   Bacteremia   PVD (peripheral vascular disease) (HCC)   Class 3 obesity (HCC)   Anemia due to chronic kidney disease   Hypertensive emergency   Leukocytosis   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HCC)   Pressure injury of skin   Acute on chronic diastolic CHF (congestive heart failure) (HCC)   PAD (peripheral artery disease) (HCC)   Irritable bowel syndrome (IBS)   Amputation of left middle finger   GERD (gastroesophageal reflux disease)  ESRD on HD -Reportedly needed 4 people and [place] lift outpatient HD -Per vascular, patient postponed  aVF placement due to cold-like symptoms -Not able to find stretcher dialysis location.  Final plan on her HD unclear.   Chronic hypoxic respiratory failure: 3 L at baseline.  Tolerated 2.5 L.  Still saturating at 100%. -Weaned oxygen to 2 L by [place].  She is anxious but agreeable.   Acute on chronic diastolic CHF/volume overload/hypertensive emergency -Nephrology managing volume with HD -Imdur and metoprolol on hold due to hypotension -Continue midodrine   Hypotension: Resolved with midodrine. -Continue midodrine   Anemia of CKD: H&H relatively stable. Recent Labs  12/07/21 1555 12/09/21 0254 12/10/21 2058 12/12/21 5056 12/12/21 9794 12/13/21 0839 12/14/21 0827 12/15/21 0313 12/16/21 1554 12/19/21 0309  HGB 7.3* 8.4* 7.8* 7.8* 7.2* 7.6* 7.1* 7.3* 7.2* 7.5*  -Could benefit from a unit of blood -IV iron per nephrology.   Controlled IDDM-2: A1c 5.3%.  Discontinued CBG monitoring and SSI.  Recent septic shock secondary to Bacteroides, bacteremia and liver, and splenic  abscess-completed antibiotic course.   Leukocytosis: Seems chronic but improved. -Monitor intermittently   PAD s/p bilateral AKA -Continue aspirin and statin. -Requested RN to replace mattress and bed   Hyperlipidemia -Statin.    Hypomagnesemia/hypokalemia/hyponatremia/hypophosphatemia -Per nephrology   Irritable bowel syndrome with constipation. Intractable nausea and vomiting -Continue Amitiza.  Sneezing, headache and fatigue: Likely allergy and oxygen.  COVID-19 and full RVP nonreactive.  -Continue Claritin -Wean oxygen  Morbid obesity Body mass index is 44.93 kg/m.   Right hip unstageable pressure injury. Left flank stage II pressure ulcer. Pressure Injury 11/12/21 Hip Right Unstageable - Full thickness tissue loss in which the base of the injury is covered by slough (yellow, tan, gray, green or brown) and/or eschar (tan, brown or black) in the wound bed. 90% covered with slough (Active)  11/12/21 0800  Location: Hip  Location Orientation: Right  Staging: Unstageable - Full thickness tissue loss in which the base of the injury is covered by slough (yellow, tan, gray, green or brown) and/or eschar (tan, brown or black) in the wound bed.  Wound Description (Comments): 90% covered with slough  Present on Admission: Yes (per RN)  Dressing Type Foam - Lift dressing to assess site every shift 12/22/21 1000     Pressure Injury 11/24/21 Flank Left;Lower Stage 2 -  Partial thickness loss of dermis presenting as a shallow open injury with a red, pink wound bed without slough. (Active)  11/24/21 0430  Location: Flank  Location Orientation: Left;Lower  Staging: Stage 2 -  Partial thickness loss of dermis presenting as a shallow open injury with a red, pink wound bed without slough.  Wound Description (Comments):   Present on Admission: No  Dressing Type Foam - Lift dressing to assess site every shift 12/22/21 1000   DVT prophylaxis:  heparin injection 5,000 Units Start:  12/01/21 1400  Code Status: Full code Family Communication: None at bedside Level of care: Med-Surg Status is: Inpatient Remains inpatient appropriate because: Lack of safe disposition.  Patient needs HD on a stretcher   Final disposition: SNF Consultants:  Nephrology Vascular surgery  Sch Meds:  Scheduled Meds:  aspirin EC  81 mg Oral Daily   atorvastatin  10 mg Oral Daily   darbepoetin (ARANESP) injection - DIALYSIS  200 mcg Intravenous Q Mon-HD   docusate sodium  100 mg Oral BID   feeding supplement  1 Container Oral TID BM   gabapentin  100 mg Oral Q1200   gabapentin  200 mg Oral QHS   heparin  5,000 Units Subcutaneous Q8H   levothyroxine  100 mcg Oral Q0600   loratadine  10 mg Oral Daily   lubiprostone  8 mcg Oral Q Tue   And   lubiprostone  8 mcg Oral Q Sat   midodrine  10 mg Oral Q M,W,F-HD   pantoprazole  40 mg Oral Daily   sevelamer carbonate  2.4 g Oral TID WC   Continuous Infusions: PRN Meds:.acetaminophen, diphenhydrAMINE, docusate sodium, heparin sodium (porcine), HYDROmorphone, ondansetron (ZOFRAN) IV **OR** ondansetron, mouth rinse, oxyCODONE, prochlorperazine, simethicone  Antimicrobials: Anti-infectives (From admission, onward)  Start     Dose/Rate Route Frequency Ordered Stop   11/28/21 1215  vancomycin (VANCOCIN) IVPB 1000 mg/200 mL premix        1,000 mg 200 mL/hr over 60 Minutes Intravenous  Once 11/28/21 1117 11/28/21 1512   11/26/21 1000  vancomycin (VANCOCIN) IVPB 1000 mg/200 mL premix        1,000 mg 200 mL/hr over 60 Minutes Intravenous On call 11/25/21 1628 11/26/21 1740   11/04/21 1800  ceFEPIme (MAXIPIME) 2 g in sodium chloride 0.9 % 100 mL IVPB        2 g 200 mL/hr over 30 Minutes Intravenous Every M-W-F (1800) 11/03/21 0952 11/08/21 2359   11/04/21 1200  ceFEPIme (MAXIPIME) 2 g in sodium chloride 0.9 % 100 mL IVPB  Status:  Discontinued        2 g 200 mL/hr over 30 Minutes Intravenous Every M-W-F (Hemodialysis) 11/02/21 0941 11/03/21  0952   11/02/21 1800  ceFEPIme (MAXIPIME) 2 g in sodium chloride 0.9 % 100 mL IVPB        2 g 200 mL/hr over 30 Minutes Intravenous  Once 11/02/21 0941 11/02/21 1812   11/02/21 1200  ceFEPIme (MAXIPIME) 2 g in sodium chloride 0.9 % 100 mL IVPB  Status:  Discontinued        2 g 200 mL/hr over 30 Minutes Intravenous Every T-Th-Sa (Hemodialysis) 11/01/21 1934 11/02/21 0941   11/01/21 2200  metroNIDAZOLE (FLAGYL) tablet 500 mg        500 mg Oral Every 12 hours 11/01/21 1827 11/08/21 2225   11/01/21 1945  ceFEPIme (MAXIPIME) 1 g in sodium chloride 0.9 % 100 mL IVPB        1 g 200 mL/hr over 30 Minutes Intravenous  Once 11/01/21 1933 11/01/21 2032   11/01/21 1730  ceFEPIme (MAXIPIME) 1 g in sodium chloride 0.9 % 100 mL IVPB  Status:  Discontinued        1 g 200 mL/hr over 30 Minutes Intravenous Every 24 hours 11/01/21 1720 11/01/21 1933        I have personally reviewed the following labs and images: CBC: Recent Labs  Lab 12/16/21 1554 12/19/21 0309  WBC 12.7* 14.2*  HGB 7.2* 7.5*  HCT 23.8* 24.8*  MCV 99.6 99.2  PLT 282 267   BMP &GFR Recent Labs  Lab 12/16/21 1554 12/19/21 0309  NA 135 138  K 5.5* 4.0  CL 98 99  CO2 24 29  GLUCOSE 106* 111*  BUN 47* 21*  CREATININE 9.51* 5.12*  CALCIUM 9.3 9.3  MG  --  1.6*  PHOS 6.4* 4.6   Estimated Creatinine Clearance: 17.1 mL/min (A) (by C-G formula based on SCr of 5.12 mg/dL (H)). Liver & Pancreas: Recent Labs  Lab 12/16/21 1554 12/19/21 0309  ALBUMIN 2.4* 2.5*   No results for input(s): "LIPASE", "AMYLASE" in the last 168 hours. No results for input(s): "AMMONIA" in the last 168 hours. Diabetic: No results for input(s): "HGBA1C" in the last 72 hours.  Recent Labs  Lab 12/19/21 0811 12/19/21 1144 12/19/21 1645 12/19/21 2038 12/20/21 0740  GLUCAP 81 124* 120* 123* 87   Cardiac Enzymes: No results for input(s): "CKTOTAL", "CKMB", "CKMBINDEX", "TROPONINI" in the last 168 hours. No results for input(s): "PROBNP" in  the last 8760 hours. Coagulation Profile: No results for input(s): "INR", "PROTIME" in the last 168 hours. Thyroid Function Tests: No results for input(s): "TSH", "T4TOTAL", "FREET4", "T3FREE", "THYROIDAB" in the last 72 hours. Lipid Profile: No results for input(s): "  CHOL", "HDL", "LDLCALC", "TRIG", "CHOLHDL", "LDLDIRECT" in the last 72 hours. Anemia Panel: No results for input(s): "VITAMINB12", "FOLATE", "FERRITIN", "TIBC", "IRON", "RETICCTPCT" in the last 72 hours.  Urine analysis: No results found for: "COLORURINE", "APPEARANCEUR", "LABSPEC", "PHURINE", "GLUCOSEU", "HGBUR", "BILIRUBINUR", "KETONESUR", "PROTEINUR", "UROBILINOGEN", "NITRITE", "LEUKOCYTESUR" Sepsis Labs: Invalid input(s): "PROCALCITONIN", "LACTICIDVEN"  Microbiology: Recent Results (from the past 240 hour(s))  Respiratory (~20 pathogens) panel by PCR     Status: None   Collection Time: 12/18/21  8:46 AM   Specimen: Nasopharyngeal Swab; Respiratory  Result Value Ref Range Status   Adenovirus NOT DETECTED NOT DETECTED Final   Coronavirus 229E NOT DETECTED NOT DETECTED Final    Comment: (NOTE) The Coronavirus on the Respiratory Panel, DOES NOT test for the novel  Coronavirus (2019 nCoV)    Coronavirus HKU1 NOT DETECTED NOT DETECTED Final   Coronavirus NL63 NOT DETECTED NOT DETECTED Final   Coronavirus OC43 NOT DETECTED NOT DETECTED Final   Metapneumovirus NOT DETECTED NOT DETECTED Final   Rhinovirus / Enterovirus NOT DETECTED NOT DETECTED Final   Influenza A NOT DETECTED NOT DETECTED Final   Influenza B NOT DETECTED NOT DETECTED Final   Parainfluenza Virus 1 NOT DETECTED NOT DETECTED Final   Parainfluenza Virus 2 NOT DETECTED NOT DETECTED Final   Parainfluenza Virus 3 NOT DETECTED NOT DETECTED Final   Parainfluenza Virus 4 NOT DETECTED NOT DETECTED Final   Respiratory Syncytial Virus NOT DETECTED NOT DETECTED Final   Bordetella pertussis NOT DETECTED NOT DETECTED Final   Bordetella Parapertussis NOT DETECTED NOT  DETECTED Final   Chlamydophila pneumoniae NOT DETECTED NOT DETECTED Final   Mycoplasma pneumoniae NOT DETECTED NOT DETECTED Final    Comment: Performed at [facility], [place]. [address] [place], [place] [address]  SARS Coronavirus 2 by RT PCR (hospital order, performed in [facility] hospital lab) *cepheid single result test*     Status: None   Collection Time: 12/18/21  8:46 AM  Result Value Ref Range Status   SARS Coronavirus 2 by RT PCR NEGATIVE NEGATIVE Final    Comment: (NOTE) SARS-CoV-2 target nucleic acids are NOT DETECTED.  The SARS-CoV-2 RNA is generally detectable in upper and lower respiratory specimens during the acute phase of infection. The lowest concentration of SARS-CoV-2 viral copies this assay can detect is 250 copies / mL. A negative result does not preclude SARS-CoV-2 infection and should not be used as the sole basis for treatment or other patient management decisions.  A negative result may occur with improper specimen collection / handling, submission of specimen other than nasopharyngeal swab, presence of viral mutation(s) within the areas targeted by this assay, and inadequate number of viral copies (<250 copies / mL). A negative result must be combined with clinical observations, patient history, and epidemiological information.  Fact Sheet for Patients:   https://www.patel.info/  Fact Sheet for Healthcare Providers: https://hall.com/  This test is not yet approved or  cleared by the [place] FDA and has been authorized for detection and/or diagnosis of SARS-CoV-2 by FDA under an Emergency Use Authorization (EUA).  This EUA will remain in effect (meaning this test can be used) for the duration of the COVID-19 declaration under Section 564(b)(1) of the Act, 21 U.S.C. section 360bbb-3(b)(1), unless the authorization is terminated or revoked sooner.  Performed at [facility], [place] [address],  [place], [place] [address]     Radiology Studies: No results found.    Courtney Gray  If 7PM-7AM, please contact night-coverage  [email or web address] 12/22/2021, 12:58 PM

## 2021-12-22 NOTE — Plan of Care (Signed)
  Problem: Skin Integrity: Goal: Risk for impaired skin integrity will decrease Outcome: Progressing   

## 2021-12-23 DIAGNOSIS — Z89611 Acquired absence of right leg above knee: Secondary | ICD-10-CM | POA: Diagnosis not present

## 2021-12-23 DIAGNOSIS — D72829 Elevated white blood cell count, unspecified: Secondary | ICD-10-CM | POA: Diagnosis not present

## 2021-12-23 DIAGNOSIS — N186 End stage renal disease: Secondary | ICD-10-CM | POA: Diagnosis not present

## 2021-12-23 DIAGNOSIS — I739 Peripheral vascular disease, unspecified: Secondary | ICD-10-CM | POA: Diagnosis not present

## 2021-12-23 LAB — CBC
HCT: 25.6 % — ABNORMAL LOW (ref 36.0–46.0)
Hemoglobin: 7.6 g/dL — ABNORMAL LOW (ref 12.0–15.0)
MCH: 29.7 pg (ref 26.0–34.0)
MCHC: 29.7 g/dL — ABNORMAL LOW (ref 30.0–36.0)
MCV: 100 fL (ref 80.0–100.0)
Platelets: 316 10*3/uL (ref 150–400)
RBC: 2.56 MIL/uL — ABNORMAL LOW (ref 3.87–5.11)
RDW: 15.4 % (ref 11.5–15.5)
WBC: 11.2 10*3/uL — ABNORMAL HIGH (ref 4.0–10.5)
nRBC: 0 % (ref 0.0–0.2)

## 2021-12-23 LAB — HEPATITIS B SURFACE ANTIBODY,QUALITATIVE: Hep B S Ab: NONREACTIVE

## 2021-12-23 LAB — RENAL FUNCTION PANEL
Albumin: 2.6 g/dL — ABNORMAL LOW (ref 3.5–5.0)
Anion gap: 10 (ref 5–15)
BUN: 28 mg/dL — ABNORMAL HIGH (ref 6–20)
CO2: 28 mmol/L (ref 22–32)
Calcium: 8.7 mg/dL — ABNORMAL LOW (ref 8.9–10.3)
Chloride: 97 mmol/L — ABNORMAL LOW (ref 98–111)
Creatinine, Ser: 5.77 mg/dL — ABNORMAL HIGH (ref 0.44–1.00)
GFR, Estimated: 8 mL/min — ABNORMAL LOW (ref >60)
Glucose, Bld: 135 mg/dL — ABNORMAL HIGH (ref 70–99)
Phosphorus: 4.6 mg/dL (ref 2.5–4.6)
Potassium: 3.4 mmol/L — ABNORMAL LOW (ref 3.5–5.1)
Sodium: 135 mmol/L (ref 135–145)

## 2021-12-23 LAB — HEPATITIS B SURFACE ANTIGEN: Hepatitis B Surface Ag: NONREACTIVE

## 2021-12-23 MED ORDER — HEPARIN SODIUM (PORCINE) 1000 UNIT/ML IJ SOLN
INTRAMUSCULAR | Status: AC
  Filled 2021-12-23: qty 3

## 2021-12-23 MED ORDER — ANTICOAGULANT SODIUM CITRATE 4% (200MG/5ML) IV SOLN
5.0000 mL | Status: DC | PRN
  Filled 2021-12-23: qty 5

## 2021-12-23 MED ORDER — LIDOCAINE HCL (PF) 1 % IJ SOLN: 5.0000 mL | INTRAMUSCULAR | Status: DC | PRN

## 2021-12-23 MED ORDER — LIDOCAINE-PRILOCAINE 2.5-2.5 % EX CREA
1.0000 | TOPICAL_CREAM | CUTANEOUS | Status: DC | PRN
  Filled 2021-12-23: qty 5

## 2021-12-23 MED ORDER — ALTEPLASE 2 MG IJ SOLR: 2.0000 mg | Freq: Once | INTRAMUSCULAR | Status: DC | PRN

## 2021-12-23 MED ORDER — HEPARIN SODIUM (PORCINE) 1000 UNIT/ML DIALYSIS
1000.0000 [IU] | INTRAMUSCULAR | Status: DC | PRN
  Administered 2021-12-23: 1000 [IU]
  Filled 2021-12-23 (×2): qty 1

## 2021-12-23 MED ORDER — HEPARIN SODIUM (PORCINE) 1000 UNIT/ML DIALYSIS
5000.0000 [IU] | INTRAMUSCULAR | Status: DC | PRN
  Administered 2021-12-23: 5000 [IU] via INTRAVENOUS_CENTRAL
  Filled 2021-12-23 (×2): qty 5

## 2021-12-23 MED ORDER — PENTAFLUOROPROP-TETRAFLUOROETH EX AERO: 1.0000 | INHALATION_SPRAY | CUTANEOUS | Status: DC | PRN

## 2021-12-23 NOTE — Progress Notes (Signed)
Pt has been declined by all local clinics [facility] and [place]) that provide stretcher HD. Pt does not currently meet criteria for LTAC per CSW. The only possible option available is if a snf can be located with on-site stretcher HD or snf can be located that transports stretcher HD pts to a clinic that provides stretcher HD. There are no known local snf in our area that provide either of the above care/services.  [facility] ([place]) could possibly accept pt back for out-pt HD if pt transported to HD via w/c transport, pt sent with appropriate hoyer pad/sling to HD, and pt able to tolerate HD in chair at hospital prior to d/c. Per therapy, pt has not been appropriate to transfer to recliner for extended periods of time for HD due to pt needing a hoyer that wraps around pts legs and there is concern for worsening pt's wounds. Will assist as needed.   Melven Sartorius Renal Navigator [phone]

## 2021-12-23 NOTE — Progress Notes (Signed)
PROGRESS NOTE  Kirstyn Lean OJJ:[id number] DOB: 07-30-1970   PCP: Monico Blitz, MD  Patient is from: ???  DOA: 11/01/2021 LOS: 19  Chief complaints Chief Complaint  Patient presents with   Vascular Access Problem     Brief Narrative / Interim history: 51 year old F with PMH of ESRD on HD TTS, recent hospitalization for bacteremia, hepatic and splenic abscess on long-term IV antibiotics, diastolic CHF, PVD s/p bilateral AKA, IDDM-2, morbid obesity and chronic sacral wound presenting with progressive shortness of breath in the setting of missed dialysis and admitted for hypertensive emergency.  Per patient, she never missed her dialysis however according to outpatient HD records, it takes at least 4 staff members and [facility] lift for patient to be seated in the dialysis chair.  Symptoms improved with hemodialysis.  Patient underwent R UE venogram on 8/30.  Vascular surgery following for possible RUE HD access.  Patient is now waiting placement where she can get HD on stretcher that has been difficult to find.  Subjective: Seen and examined earlier this morning.  No major events overnight of this morning.  No complaints.  Wanted to sleep a little more.   Objective: Vitals:   12/22/21 1808 12/22/21 2154 12/23/21 0551 12/23/21 0947  BP: (!) 138/53 (!) 128/50 (!) 145/45 (!) 134/53  Pulse: 79 93 81 82  Resp: 18 18 18 18   Temp: 98 F (36.7 C) 98.6 F (37 C) 98.1 F (36.7 C) 98.3 F (36.8 C)  TempSrc: Oral   Oral  SpO2: 98% 100% 99% 100%  Weight:      Height:        Examination:  GENERAL: No apparent distress.  Nontoxic. HEENT: MMM.  Vision and hearing grossly intact.  NECK: Supple.  No apparent JVD.  RESP:  No IWOB.  Fair aeration bilaterally. CVS:  RRR. Heart sounds normal.  ABD/GI/GU: BS+. Abd soft, NTND.  MSK/EXT: Bilateral AKA. SKIN: no apparent skin lesion or wound NEURO: Awake and alert. Oriented appropriately.  No apparent focal neuro deficit. PSYCH: Calm. Normal  affect.   Procedures:  Hemodialysis  Microbiology summarized: 9/13-full RVP nonreactive. 9/13-COVID-19 PCR nonreactive.  Assessment and plan: Principal Problem:   End stage renal disease ([place]) Active Problems:   Hypertension   Decubitus ulcer of sacral region, stage 2 (HCC)   Type 2 diabetes mellitus with hyperlipidemia (HCC)   Chronic respiratory failure with hypoxia (HCC)   Bacteremia   PVD (peripheral vascular disease) (HCC)   Class 3 obesity (HCC)   Anemia due to chronic kidney disease   Hypertensive emergency   Leukocytosis   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HCC)   Pressure injury of skin   Acute on chronic diastolic CHF (congestive heart failure) (HCC)   PAD (peripheral artery disease) (HCC)   Irritable bowel syndrome (IBS)   Amputation of left middle finger   GERD (gastroesophageal reflux disease)  ESRD on HD -Reportedly needed 4 people and [place] lift outpatient HD -Per vascular, patient postponed  aVF placement due to cold-like symptoms -Not able to find stretcher dialysis location.  Final plan on her HD unclear.   Chronic hypoxic respiratory failure: 3 L at baseline.  Now saturating at 100% on 2 L.  -We will try weaning off oxygen slowly.  She is very anxious about her oxygen.  Acute on chronic diastolic CHF/volume overload/hypertensive emergency -Nephrology managing volume with HD -Imdur and metoprolol on hold due to hypotension -Continue midodrine   Hypotension: Resolved with midodrine. -Continue midodrine   Anemia of  CKD: H&H relatively stable. Recent Labs    12/07/21 1555 12/09/21 0254 12/10/21 2058 12/12/21 0627 12/12/21 0823 12/13/21 0839 12/14/21 0827 12/15/21 0313 12/16/21 1554 12/19/21 0309  HGB 7.3* 8.4* 7.8* 7.8* 7.2* 7.6* 7.1* 7.3* 7.2* 7.5*  -Could benefit from a unit of blood -IV iron per nephrology.   Controlled IDDM-2: A1c 5.3%.  Discontinued CBG monitoring and SSI.  Recent septic shock secondary to Bacteroides,  bacteremia and liver, and splenic abscess-completed antibiotic course.   Leukocytosis: Seems chronic but improved. -Monitor intermittently   PAD s/p bilateral AKA -Continue aspirin and statin. -Requested RN to replace mattress and bed   Hyperlipidemia -Statin.    Hypomagnesemia/hypokalemia/hyponatremia/hypophosphatemia -Per nephrology   Irritable bowel syndrome with constipation. Intractable nausea and vomiting -Continue Amitiza.  Sneezing, headache and fatigue: Likely allergy and oxygen.  COVID-19 and full RVP nonreactive.  -Continue Claritin -Wean oxygen  Morbid obesity Body mass index is 44.93 kg/m.   Right hip unstageable pressure injury. Left flank stage II pressure ulcer. Pressure Injury 11/12/21 Hip Right Unstageable - Full thickness tissue loss in which the base of the injury is covered by slough (yellow, tan, gray, green or brown) and/or eschar (tan, brown or black) in the wound bed. 90% covered with slough (Active)  11/12/21 0800  Location: Hip  Location Orientation: Right  Staging: Unstageable - Full thickness tissue loss in which the base of the injury is covered by slough (yellow, tan, gray, green or brown) and/or eschar (tan, brown or black) in the wound bed.  Wound Description (Comments): 90% covered with slough  Present on Admission: Yes (per RN)  Dressing Type Silver hydrofiber;Foam - Lift dressing to assess site every shift 12/23/21 1100     Pressure Injury 11/24/21 Flank Left;Lower Stage 2 -  Partial thickness loss of dermis presenting as a shallow open injury with a red, pink wound bed without slough. (Active)  11/24/21 0430  Location: Flank  Location Orientation: Left;Lower  Staging: Stage 2 -  Partial thickness loss of dermis presenting as a shallow open injury with a red, pink wound bed without slough.  Wound Description (Comments):   Present on Admission: No  Dressing Type Foam - Lift dressing to assess site every shift 12/23/21 1100   DVT  prophylaxis:  heparin injection 5,000 Units Start: 12/01/21 1400  Code Status: Full code Family Communication: None at bedside Level of care: Med-Surg Status is: Inpatient Remains inpatient appropriate because: Lack of safe disposition.  Patient needs HD on a stretcher   Final disposition: SNF Consultants:  Nephrology Vascular surgery  Sch Meds:  Scheduled Meds:  aspirin EC  81 mg Oral Daily   atorvastatin  10 mg Oral Daily   darbepoetin (ARANESP) injection - DIALYSIS  200 mcg Intravenous Q Mon-HD   docusate sodium  100 mg Oral BID   feeding supplement  1 Container Oral TID BM   gabapentin  100 mg Oral Q1200   gabapentin  200 mg Oral QHS   heparin  5,000 Units Subcutaneous Q8H   levothyroxine  100 mcg Oral Q0600   loratadine  10 mg Oral Daily   lubiprostone  8 mcg Oral Q Tue   And   lubiprostone  8 mcg Oral Q Sat   midodrine  10 mg Oral Q M,W,F-HD   pantoprazole  40 mg Oral Daily   sevelamer carbonate  2.4 g Oral TID WC   Continuous Infusions:  anticoagulant sodium citrate     PRN Meds:.acetaminophen, alteplase, anticoagulant sodium citrate, diphenhydrAMINE, docusate sodium,  heparin, heparin, heparin sodium (porcine), HYDROmorphone, lidocaine (PF), lidocaine-prilocaine, ondansetron (ZOFRAN) IV **OR** ondansetron, mouth rinse, oxyCODONE, pentafluoroprop-tetrafluoroeth, prochlorperazine, simethicone  Antimicrobials: Anti-infectives (From admission, onward)    Start     Dose/Rate Route Frequency Ordered Stop   11/28/21 1215  vancomycin (VANCOCIN) IVPB 1000 mg/200 mL premix        1,000 mg 200 mL/hr over 60 Minutes Intravenous  Once 11/28/21 1117 11/28/21 1512   11/26/21 1000  vancomycin (VANCOCIN) IVPB 1000 mg/200 mL premix        1,000 mg 200 mL/hr over 60 Minutes Intravenous On call 11/25/21 1628 11/26/21 1740   11/04/21 1800  ceFEPIme (MAXIPIME) 2 g in sodium chloride 0.9 % 100 mL IVPB        2 g 200 mL/hr over 30 Minutes Intravenous Every M-W-F (1800) 11/03/21 0952  11/08/21 2359   11/04/21 1200  ceFEPIme (MAXIPIME) 2 g in sodium chloride 0.9 % 100 mL IVPB  Status:  Discontinued        2 g 200 mL/hr over 30 Minutes Intravenous Every M-W-F (Hemodialysis) 11/02/21 0941 11/03/21 0952   11/02/21 1800  ceFEPIme (MAXIPIME) 2 g in sodium chloride 0.9 % 100 mL IVPB        2 g 200 mL/hr over 30 Minutes Intravenous  Once 11/02/21 0941 11/02/21 1812   11/02/21 1200  ceFEPIme (MAXIPIME) 2 g in sodium chloride 0.9 % 100 mL IVPB  Status:  Discontinued        2 g 200 mL/hr over 30 Minutes Intravenous Every T-Th-Sa (Hemodialysis) 11/01/21 1934 11/02/21 0941   11/01/21 2200  metroNIDAZOLE (FLAGYL) tablet 500 mg        500 mg Oral Every 12 hours 11/01/21 1827 11/08/21 2225   11/01/21 1945  ceFEPIme (MAXIPIME) 1 g in sodium chloride 0.9 % 100 mL IVPB        1 g 200 mL/hr over 30 Minutes Intravenous  Once 11/01/21 1933 11/01/21 2032   11/01/21 1730  ceFEPIme (MAXIPIME) 1 g in sodium chloride 0.9 % 100 mL IVPB  Status:  Discontinued        1 g 200 mL/hr over 30 Minutes Intravenous Every 24 hours 11/01/21 1720 11/01/21 1933        I have personally reviewed the following labs and images: CBC: Recent Labs  Lab 12/16/21 1554 12/19/21 0309  WBC 12.7* 14.2*  HGB 7.2* 7.5*  HCT 23.8* 24.8*  MCV 99.6 99.2  PLT 282 267   BMP &GFR Recent Labs  Lab 12/16/21 1554 12/19/21 0309  NA 135 138  K 5.5* 4.0  CL 98 99  CO2 24 29  GLUCOSE 106* 111*  BUN 47* 21*  CREATININE 9.51* 5.12*  CALCIUM 9.3 9.3  MG  --  1.6*  PHOS 6.4* 4.6   Estimated Creatinine Clearance: 17.1 mL/min (A) (by C-G formula based on SCr of 5.12 mg/dL (H)). Liver & Pancreas: Recent Labs  Lab 12/16/21 1554 12/19/21 0309  ALBUMIN 2.4* 2.5*   No results for input(s): "LIPASE", "AMYLASE" in the last 168 hours. No results for input(s): "AMMONIA" in the last 168 hours. Diabetic: No results for input(s): "HGBA1C" in the last 72 hours.  Recent Labs  Lab 12/19/21 0811 12/19/21 1144  12/19/21 1645 12/19/21 2038 12/20/21 0740  GLUCAP 81 124* 120* 123* 87   Cardiac Enzymes: No results for input(s): "CKTOTAL", "CKMB", "CKMBINDEX", "TROPONINI" in the last 168 hours. No results for input(s): "PROBNP" in the last 8760 hours. Coagulation Profile: No results for input(s): "INR", "  PROTIME" in the last 168 hours. Thyroid Function Tests: No results for input(s): "TSH", "T4TOTAL", "FREET4", "T3FREE", "THYROIDAB" in the last 72 hours. Lipid Profile: No results for input(s): "CHOL", "HDL", "LDLCALC", "TRIG", "CHOLHDL", "LDLDIRECT" in the last 72 hours. Anemia Panel: No results for input(s): "VITAMINB12", "FOLATE", "FERRITIN", "TIBC", "IRON", "RETICCTPCT" in the last 72 hours.  Urine analysis: No results found for: "COLORURINE", "APPEARANCEUR", "LABSPEC", "PHURINE", "GLUCOSEU", "HGBUR", "BILIRUBINUR", "KETONESUR", "PROTEINUR", "UROBILINOGEN", "NITRITE", "LEUKOCYTESUR" Sepsis Labs: Invalid input(s): "PROCALCITONIN", "LACTICIDVEN"  Microbiology: Recent Results (from the past 240 hour(s))  Respiratory (~20 pathogens) panel by PCR     Status: None   Collection Time: 12/18/21  8:46 AM   Specimen: Nasopharyngeal Swab; Respiratory  Result Value Ref Range Status   Adenovirus NOT DETECTED NOT DETECTED Final   Coronavirus 229E NOT DETECTED NOT DETECTED Final    Comment: (NOTE) The Coronavirus on the Respiratory Panel, DOES NOT test for the novel  Coronavirus (2019 nCoV)    Coronavirus HKU1 NOT DETECTED NOT DETECTED Final   Coronavirus NL63 NOT DETECTED NOT DETECTED Final   Coronavirus OC43 NOT DETECTED NOT DETECTED Final   Metapneumovirus NOT DETECTED NOT DETECTED Final   Rhinovirus / Enterovirus NOT DETECTED NOT DETECTED Final   Influenza A NOT DETECTED NOT DETECTED Final   Influenza B NOT DETECTED NOT DETECTED Final   Parainfluenza Virus 1 NOT DETECTED NOT DETECTED Final   Parainfluenza Virus 2 NOT DETECTED NOT DETECTED Final   Parainfluenza Virus 3 NOT DETECTED NOT DETECTED  Final   Parainfluenza Virus 4 NOT DETECTED NOT DETECTED Final   Respiratory Syncytial Virus NOT DETECTED NOT DETECTED Final   Bordetella pertussis NOT DETECTED NOT DETECTED Final   Bordetella Parapertussis NOT DETECTED NOT DETECTED Final   Chlamydophila pneumoniae NOT DETECTED NOT DETECTED Final   Mycoplasma pneumoniae NOT DETECTED NOT DETECTED Final    Comment: Performed at [facility], [place]. [address], [place], [place] [address]  SARS Coronavirus 2 by RT PCR (hospital order, performed in [facility] hospital lab) *cepheid single result test*     Status: None   Collection Time: 12/18/21  8:46 AM  Result Value Ref Range Status   SARS Coronavirus 2 by RT PCR NEGATIVE NEGATIVE Final    Comment: (NOTE) SARS-CoV-2 target nucleic acids are NOT DETECTED.  The SARS-CoV-2 RNA is generally detectable in upper and lower respiratory specimens during the acute phase of infection. The lowest concentration of SARS-CoV-2 viral copies this assay can detect is 250 copies / mL. A negative result does not preclude SARS-CoV-2 infection and should not be used as the sole basis for treatment or other patient management decisions.  A negative result may occur with improper specimen collection / handling, submission of specimen other than nasopharyngeal swab, presence of viral mutation(s) within the areas targeted by this assay, and inadequate number of viral copies (<250 copies / mL). A negative result must be combined with clinical observations, patient history, and epidemiological information.  Fact Sheet for Patients:   https://www.patel.info/  Fact Sheet for Healthcare Providers: https://hall.com/  This test is not yet approved or  cleared by the [place] FDA and has been authorized for detection and/or diagnosis of SARS-CoV-2 by FDA under an Emergency Use Authorization (EUA).  This EUA will remain in effect (meaning this test can be used) for the  duration of the COVID-19 declaration under Section 564(b)(1) of the Act, 21 U.S.C. section 360bbb-3(b)(1), unless the authorization is terminated or revoked sooner.  Performed at [facility], [address] [place], [place] [address]     Radiology Studies: No results found.    Tori Cupps T. Manlius  If 7PM-7AM, please contact night-coverage [email or web address] 12/23/2021, 12:16 PM

## 2021-12-23 NOTE — Progress Notes (Signed)
[facility] Progress Note   Subjective: Seen in room, resting quietly. HD later today on schedule.      Objective Vitals:   12/22/21 1808 12/22/21 2154 12/23/21 0551 12/23/21 0947  BP: (!) 138/53 (!) 128/50 (!) 145/45 (!) 134/53  Pulse: 79 93 81 82  Resp: 18 18 18 18   Temp: 98 F (36.7 C) 98.6 F (37 C) 98.1 F (36.7 C) 98.3 F (36.8 C)  TempSrc: Oral   Oral  SpO2: 98% 100% 99% 100%  Weight:      Height:       Physical Exam General: Chronically ill appearing morbidly obese female in NAD Heart: S1,S2, RRR No M/R/G Lungs: CTAB, no WOB Abdomen: Obese, NABS Extremities: Bilateral AKA no stump edema Dialysis Access: LIJ TDC drsg intact   Additional Objective Labs: Basic Metabolic Panel: Recent Labs  Lab 12/16/21 1554 12/19/21 0309  NA 135 138  K 5.5* 4.0  CL 98 99  CO2 24 29  GLUCOSE 106* 111*  BUN 47* 21*  CREATININE 9.51* 5.12*  CALCIUM 9.3 9.3  PHOS 6.4* 4.6   Liver Function Tests: Recent Labs  Lab 12/16/21 1554 12/19/21 0309  ALBUMIN 2.4* 2.5*   No results for input(s): "LIPASE", "AMYLASE" in the last 168 hours. CBC: Recent Labs  Lab 12/16/21 1554 12/19/21 0309  WBC 12.7* 14.2*  HGB 7.2* 7.5*  HCT 23.8* 24.8*  MCV 99.6 99.2  PLT 282 267   Blood Culture    Component Value Date/Time   SDES BLOOD LEFT ANTECUBITAL 10/16/2021 1534   SPECREQUEST  10/16/2021 1534    BOTTLES DRAWN AEROBIC AND ANAEROBIC Blood Culture results may not be optimal due to an inadequate volume of blood received in culture bottles   CULT  10/16/2021 1534    NO GROWTH 5 DAYS Performed at [facility], [place] [address], [place], [place] [address]    REPTSTATUS 10/21/2021 FINAL 10/16/2021 1534    Cardiac Enzymes: No results for input(s): "CKTOTAL", "CKMB", "CKMBINDEX", "TROPONINI" in the last 168 hours. CBG: Recent Labs  Lab 12/19/21 0811 12/19/21 1144 12/19/21 1645 12/19/21 2038 12/20/21 0740  GLUCAP 81 124* 120* 123* 87   Iron Studies: No  results for input(s): "IRON", "TIBC", "TRANSFERRIN", "FERRITIN" in the last 72 hours. @lablastinr3 @ Studies/Results: No results found. Medications:  anticoagulant sodium citrate      aspirin EC  81 mg Oral Daily   atorvastatin  10 mg Oral Daily   darbepoetin (ARANESP) injection - DIALYSIS  200 mcg Intravenous Q Mon-HD   docusate sodium  100 mg Oral BID   feeding supplement  1 Container Oral TID BM   gabapentin  100 mg Oral Q1200   gabapentin  200 mg Oral QHS   heparin  5,000 Units Subcutaneous Q8H   levothyroxine  100 mcg Oral Q0600   loratadine  10 mg Oral Daily   lubiprostone  8 mcg Oral Q Tue   And   lubiprostone  8 mcg Oral Q Sat   midodrine  10 mg Oral Q M,W,F-HD   pantoprazole  40 mg Oral Daily   sevelamer carbonate  2.4 g Oral TID WC     Dialysis Orders: MWF - [facility]  4hrs, BFR 400, DFR 600,  AVF, EDW 146kg, 3K/ 2Ca - last hep B labs 8/21 - Heparin 2500 units IV with HD - Hectorol 89mcg IV q HD, held as of 10/30/21   Assessment/Plan: ESRD: Back to MWF schedule this week - next HD 9/18 [facility]  malfunction: Had required two [facility] exchanges and having issues again on 9/4, got overnight tPA dwell. IR thinks TDC is positional as no fibrin on tip at last exchange. Discussed the need for a long term solution, VVS consulted - plan was for RUE AVF this week (12/19/21)  but was postponed. Heparin boluses restarted with dialysis (will add mid treatment) BP/volume: BP variable, was on low dose metoprolol and midodine -> metop discontinued on 9/1. Last hoyer weight was 125kg which is down significantly from OP EDW -> lower on discharge. BP dropped pretty significantly with HD 9/11 - midodrine was given too early. Better now - follow timing with midodrine Chronic respiratory failure with hypoxia on cannula oxygen at home: on 5L O2 at baseline. Anemia of ESRD: Hgb 7.5 - continue high dose ESA q Monday while here. Transfuse < 7. Secondary hyperparathyroidism: CorrCa high, VDRA  on hold. Renvela increased - Phos better. 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 Hypokalemia: Recurrent issue recently and dislikes Klor-Con tabs. Was using high K bath with HD -> back to 2K usual. Severe sacral decubitus  wound: Per primary/wound care team. Disposition: Difficult situation. Prior to admit, patient required 6 staff members and 2 hoyer pads for transfer into HD chair as OP. Could not stay on dialysis for full treatment because of pain of multiple decubitus ulcers and PTAR unable to lift pt into ambulance for transport. SW has been unable to locate stretcher dialysis location so far. Her weight is much lower from admit, asked PT about trying to hoyer to recliner for HD -> apparently would need hoyer that wraps around her legs and there is concern for worsening her chronic wounds - unclear path at this time. Continues to need SNF on discharge.      Severn Goddard H. Timohty Renbarger NP-C 12/23/2021, 11:47 AM  [employer] [phone]

## 2021-12-23 NOTE — Progress Notes (Signed)
Received patient in bed to unit.  Alert and oriented.  Informed consent signed and in chart.   Treatment initiated: 1338  Treatment completed: 1745  Patient tolerated well.  Transported back to the room  Alert, without acute distress.  Hand-off given to patient's nurse.   Access used: HD cath Access issues: NA  Total UF removed: 2215ml Medication(s) given: Heparin bolus 2500 units at beginning of tx, Heparin bolus 2500 units mid way through tx, Aranesp 277mcg IVP, Heparin dwells 3800units Post HD VS: 97.7    113/44    19    13    100% 2L [place] Post HD weight: 121.8kg   [facility]

## 2021-12-23 NOTE — Plan of Care (Signed)
  Problem: Education: Goal: Knowledge of General Education information will improve Description: Including pain rating scale, medication(s)/side effects and non-pharmacologic comfort measures Outcome: Progressing   Problem: Clinical Measurements: Goal: Ability to maintain clinical measurements within normal limits will improve Outcome: Progressing   

## 2021-12-24 DIAGNOSIS — I739 Peripheral vascular disease, unspecified: Secondary | ICD-10-CM | POA: Diagnosis not present

## 2021-12-24 DIAGNOSIS — N186 End stage renal disease: Secondary | ICD-10-CM | POA: Diagnosis not present

## 2021-12-24 DIAGNOSIS — Z89611 Acquired absence of right leg above knee: Secondary | ICD-10-CM | POA: Diagnosis not present

## 2021-12-24 DIAGNOSIS — D72829 Elevated white blood cell count, unspecified: Secondary | ICD-10-CM | POA: Diagnosis not present

## 2021-12-24 LAB — HEPATITIS B SURFACE ANTIBODY, QUANTITATIVE: Hep B S AB Quant (Post): 7.3 m[IU]/mL — ABNORMAL LOW (ref >9.9)

## 2021-12-24 NOTE — Progress Notes (Signed)
PROGRESS NOTE  Courtney Gray BSW:[id number] DOB: 1971/04/04   PCP: Monico Blitz, MD  Patient is from: ???  DOA: 11/01/2021 LOS: 33  Chief complaints Chief Complaint  Patient presents with   Vascular Access Problem     Brief Narrative / Interim history: 51 year old F with PMH of ESRD on HD TTS, recent hospitalization for bacteremia, hepatic and splenic abscess on long-term IV antibiotics, diastolic CHF, PVD s/p bilateral AKA, IDDM-2, morbid obesity and chronic sacral wound presenting with progressive shortness of breath in the setting of missed dialysis and admitted for hypertensive emergency.  Per patient, she never missed her dialysis however according to outpatient HD records, it takes at least 4 staff members and [facility] lift for patient to be seated in the dialysis chair.  Symptoms improved with hemodialysis.  Patient underwent R UE venogram on 8/30.  Vascular surgery following for possible RUE HD access.  Patient is now waiting placement where she can get HD on stretcher that has been difficult to find.  Subjective: Seen and examined earlier this afternoon.  No major events overnight of this morning.  No complaints but feels tired.  Objective: Vitals:   12/23/21 1816 12/23/21 2123 12/24/21 0526 12/24/21 0900  BP: (!) 136/54 (!) 110/36 (!) 127/47 125/62  Pulse: 72 80 74 70  Resp: 16 18 16 16   Temp: 98.3 F (36.8 C) 99.2 F (37.3 C) 98.1 F (36.7 C) (!) 97.2 F (36.2 C)  TempSrc: Oral Oral Oral Oral  SpO2: 100% 99% 100% 100%  Weight:      Height:        Examination:  GENERAL: No apparent distress.  Nontoxic. HEENT: MMM.  Vision and hearing grossly intact.  NECK: Supple.  No apparent JVD.  RESP:  No IWOB.  Fair aeration bilaterally. CVS:  RRR. Heart sounds normal.  ABD/GI/GU: BS+. Abd soft, NTND.  MSK/EXT: Bilateral AKA SKIN: no apparent skin lesion or wound NEURO: Awake and alert. Oriented appropriately.  No apparent focal neuro deficit. PSYCH: Calm. Normal affect.    Procedures:  Hemodialysis  Microbiology summarized: 9/13-full RVP nonreactive. 9/13-COVID-19 PCR nonreactive.  Assessment and plan: Principal Problem:   End stage renal disease ([place]) Active Problems:   Hypertension   Decubitus ulcer of sacral region, stage 2 (HCC)   Type 2 diabetes mellitus with hyperlipidemia (HCC)   Chronic respiratory failure with hypoxia (HCC)   Bacteremia   PVD (peripheral vascular disease) (HCC)   Class 3 obesity (HCC)   Anemia due to chronic kidney disease   Hypertensive emergency   Leukocytosis   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HCC)   Pressure injury of skin   Acute on chronic diastolic CHF (congestive heart failure) (HCC)   PAD (peripheral artery disease) (HCC)   Irritable bowel syndrome (IBS)   Amputation of left middle finger   GERD (gastroesophageal reflux disease)  ESRD on HD -Reportedly needed 4 people and [place] lift outpatient HD -Per vascular, patient postponed  aVF placement due to cold-like symptoms -Not able to find stretcher dialysis location.  Final plan on her HD unclear.   Chronic hypoxic respiratory failure: 3 L at baseline.  Now saturating at 100% on 2 L.  Reports saturation of 90. -Weaning off oxygen slowly but she is very anxious. -She may not need more than nocturnal oxygen.  Acute on chronic diastolic CHF/volume overload/hypertensive emergency -Nephrology managing volume with HD -Imdur and metoprolol on hold due to hypotension -Continue midodrine   Hypotension: Resolved.  -Continue midodrine -Continue holding Imdur and  metoprolol.   Anemia of CKD: H&H relatively stable. Recent Labs    12/09/21 0254 12/10/21 2058 12/12/21 0627 12/12/21 0823 12/13/21 0839 12/14/21 0827 12/15/21 0313 12/16/21 1554 12/19/21 0309 12/23/21 1331  HGB 8.4* 7.8* 7.8* 7.2* 7.6* 7.1* 7.3* 7.2* 7.5* 7.6*  -Could benefit from a unit of blood -IV iron per nephrology.   Controlled IDDM-2: A1c 5.3%.  Discontinued CBG  monitoring and SSI.  Recent septic shock secondary to Bacteroides, bacteremia and liver, and splenic abscess-completed antibiotic course.   Leukocytosis: Seems chronic but improved. -Monitor intermittently   PAD s/p bilateral AKA -Continue aspirin and statin. -Requested RN to replace mattress and bed   Hyperlipidemia -Statin.    Hypomagnesemia/hypokalemia/hyponatremia/hypophosphatemia -Per nephrology   Irritable bowel syndrome with constipation. Intractable nausea and vomiting -Continue Amitiza.  Sneezing, headache and fatigue: Likely allergy and oxygen.  COVID-19 and full RVP nonreactive.  -Continue Claritin -Wean oxygen  Morbid obesity Body mass index is 44.68 kg/m.   Right hip unstageable pressure injury. Left flank stage II pressure ulcer. Pressure Injury 11/12/21 Hip Right Unstageable - Full thickness tissue loss in which the base of the injury is covered by slough (yellow, tan, gray, green or brown) and/or eschar (tan, brown or black) in the wound bed. 90% covered with slough (Active)  11/12/21 0800  Location: Hip  Location Orientation: Right  Staging: Unstageable - Full thickness tissue loss in which the base of the injury is covered by slough (yellow, tan, gray, green or brown) and/or eschar (tan, brown or black) in the wound bed.  Wound Description (Comments): 90% covered with slough  Present on Admission: Yes (per RN)  Dressing Type Foam - Lift dressing to assess site every shift 12/24/21 0600     Pressure Injury 11/24/21 Flank Left;Lower Stage 2 -  Partial thickness loss of dermis presenting as a shallow open injury with a red, pink wound bed without slough. (Active)  11/24/21 0430  Location: Flank  Location Orientation: Left;Lower  Staging: Stage 2 -  Partial thickness loss of dermis presenting as a shallow open injury with a red, pink wound bed without slough.  Wound Description (Comments):   Present on Admission: No  Dressing Type Foam - Lift dressing to  assess site every shift 12/23/21 2000   DVT prophylaxis:  heparin injection 5,000 Units Start: 12/01/21 1400  Code Status: Full code Family Communication: None at bedside Level of care: Med-Surg Status is: Inpatient Remains inpatient appropriate because: Lack of safe disposition.  Patient needs HD on a stretcher   Final disposition: SNF??  Difficult. Consultants:  Nephrology Vascular surgery  Sch Meds:  Scheduled Meds:  aspirin EC  81 mg Oral Daily   atorvastatin  10 mg Oral Daily   darbepoetin (ARANESP) injection - DIALYSIS  200 mcg Intravenous Q Mon-HD   docusate sodium  100 mg Oral BID   feeding supplement  1 Container Oral TID BM   gabapentin  100 mg Oral Q1200   gabapentin  200 mg Oral QHS   heparin  5,000 Units Subcutaneous Q8H   levothyroxine  100 mcg Oral Q0600   loratadine  10 mg Oral Daily   lubiprostone  8 mcg Oral Q Tue   And   lubiprostone  8 mcg Oral Q Sat   midodrine  10 mg Oral Q M,W,F-HD   pantoprazole  40 mg Oral Daily   sevelamer carbonate  2.4 g Oral TID WC   Continuous Infusions:   PRN Meds:.acetaminophen, diphenhydrAMINE, docusate sodium, heparin sodium (porcine), HYDROmorphone,  ondansetron (ZOFRAN) IV **OR** ondansetron, mouth rinse, oxyCODONE, prochlorperazine, simethicone  Antimicrobials: Anti-infectives (From admission, onward)    Start     Dose/Rate Route Frequency Ordered Stop   11/28/21 1215  vancomycin (VANCOCIN) IVPB 1000 mg/200 mL premix        1,000 mg 200 mL/hr over 60 Minutes Intravenous  Once 11/28/21 1117 11/28/21 1512   11/26/21 1000  vancomycin (VANCOCIN) IVPB 1000 mg/200 mL premix        1,000 mg 200 mL/hr over 60 Minutes Intravenous On call 11/25/21 1628 11/26/21 1740   11/04/21 1800  ceFEPIme (MAXIPIME) 2 g in sodium chloride 0.9 % 100 mL IVPB        2 g 200 mL/hr over 30 Minutes Intravenous Every M-W-F (1800) 11/03/21 0952 11/08/21 2359   11/04/21 1200  ceFEPIme (MAXIPIME) 2 g in sodium chloride 0.9 % 100 mL IVPB   Status:  Discontinued        2 g 200 mL/hr over 30 Minutes Intravenous Every M-W-F (Hemodialysis) 11/02/21 0941 11/03/21 0952   11/02/21 1800  ceFEPIme (MAXIPIME) 2 g in sodium chloride 0.9 % 100 mL IVPB        2 g 200 mL/hr over 30 Minutes Intravenous  Once 11/02/21 0941 11/02/21 1812   11/02/21 1200  ceFEPIme (MAXIPIME) 2 g in sodium chloride 0.9 % 100 mL IVPB  Status:  Discontinued        2 g 200 mL/hr over 30 Minutes Intravenous Every T-Th-Sa (Hemodialysis) 11/01/21 1934 11/02/21 0941   11/01/21 2200  metroNIDAZOLE (FLAGYL) tablet 500 mg        500 mg Oral Every 12 hours 11/01/21 1827 11/08/21 2225   11/01/21 1945  ceFEPIme (MAXIPIME) 1 g in sodium chloride 0.9 % 100 mL IVPB        1 g 200 mL/hr over 30 Minutes Intravenous  Once 11/01/21 1933 11/01/21 2032   11/01/21 1730  ceFEPIme (MAXIPIME) 1 g in sodium chloride 0.9 % 100 mL IVPB  Status:  Discontinued        1 g 200 mL/hr over 30 Minutes Intravenous Every 24 hours 11/01/21 1720 11/01/21 1933        I have personally reviewed the following labs and images: CBC: Recent Labs  Lab 12/19/21 0309 12/23/21 1331  WBC 14.2* 11.2*  HGB 7.5* 7.6*  HCT 24.8* 25.6*  MCV 99.2 100.0  PLT 267 316   BMP &GFR Recent Labs  Lab 12/19/21 0309 12/23/21 1331  NA 138 135  K 4.0 3.4*  CL 99 97*  CO2 29 28  GLUCOSE 111* 135*  BUN 21* 28*  CREATININE 5.12* 5.77*  CALCIUM 9.3 8.7*  MG 1.6*  --   PHOS 4.6 4.6   Estimated Creatinine Clearance: 15.1 mL/min (A) (by C-G formula based on SCr of 5.77 mg/dL (H)). Liver & Pancreas: Recent Labs  Lab 12/19/21 0309 12/23/21 1331  ALBUMIN 2.5* 2.6*   No results for input(s): "LIPASE", "AMYLASE" in the last 168 hours. No results for input(s): "AMMONIA" in the last 168 hours. Diabetic: No results for input(s): "HGBA1C" in the last 72 hours.  Recent Labs  Lab 12/19/21 0811 12/19/21 1144 12/19/21 1645 12/19/21 2038 12/20/21 0740  GLUCAP 81 124* 120* 123* 87   Cardiac Enzymes: No  results for input(s): "CKTOTAL", "CKMB", "CKMBINDEX", "TROPONINI" in the last 168 hours. No results for input(s): "PROBNP" in the last 8760 hours. Coagulation Profile: No results for input(s): "INR", "PROTIME" in the last 168 hours. Thyroid Function Tests: No  results for input(s): "TSH", "T4TOTAL", "FREET4", "T3FREE", "THYROIDAB" in the last 72 hours. Lipid Profile: No results for input(s): "CHOL", "HDL", "LDLCALC", "TRIG", "CHOLHDL", "LDLDIRECT" in the last 72 hours. Anemia Panel: No results for input(s): "VITAMINB12", "FOLATE", "FERRITIN", "TIBC", "IRON", "RETICCTPCT" in the last 72 hours.  Urine analysis: No results found for: "COLORURINE", "APPEARANCEUR", "LABSPEC", "PHURINE", "GLUCOSEU", "HGBUR", "BILIRUBINUR", "KETONESUR", "PROTEINUR", "UROBILINOGEN", "NITRITE", "LEUKOCYTESUR" Sepsis Labs: Invalid input(s): "PROCALCITONIN", "LACTICIDVEN"  Microbiology: Recent Results (from the past 240 hour(s))  Respiratory (~20 pathogens) panel by PCR     Status: None   Collection Time: 12/18/21  8:46 AM   Specimen: Nasopharyngeal Swab; Respiratory  Result Value Ref Range Status   Adenovirus NOT DETECTED NOT DETECTED Final   Coronavirus 229E NOT DETECTED NOT DETECTED Final    Comment: (NOTE) The Coronavirus on the Respiratory Panel, DOES NOT test for the novel  Coronavirus (2019 nCoV)    Coronavirus HKU1 NOT DETECTED NOT DETECTED Final   Coronavirus NL63 NOT DETECTED NOT DETECTED Final   Coronavirus OC43 NOT DETECTED NOT DETECTED Final   Metapneumovirus NOT DETECTED NOT DETECTED Final   Rhinovirus / Enterovirus NOT DETECTED NOT DETECTED Final   Influenza A NOT DETECTED NOT DETECTED Final   Influenza B NOT DETECTED NOT DETECTED Final   Parainfluenza Virus 1 NOT DETECTED NOT DETECTED Final   Parainfluenza Virus 2 NOT DETECTED NOT DETECTED Final   Parainfluenza Virus 3 NOT DETECTED NOT DETECTED Final   Parainfluenza Virus 4 NOT DETECTED NOT DETECTED Final   Respiratory Syncytial Virus NOT  DETECTED NOT DETECTED Final   Bordetella pertussis NOT DETECTED NOT DETECTED Final   Bordetella Parapertussis NOT DETECTED NOT DETECTED Final   Chlamydophila pneumoniae NOT DETECTED NOT DETECTED Final   Mycoplasma pneumoniae NOT DETECTED NOT DETECTED Final    Comment: Performed at [facility], [place]. [address] [place], [place] [address]  SARS Coronavirus 2 by RT PCR (hospital order, performed in [facility] hospital lab) *cepheid single result test*     Status: None   Collection Time: 12/18/21  8:46 AM  Result Value Ref Range Status   SARS Coronavirus 2 by RT PCR NEGATIVE NEGATIVE Final    Comment: (NOTE) SARS-CoV-2 target nucleic acids are NOT DETECTED.  The SARS-CoV-2 RNA is generally detectable in upper and lower respiratory specimens during the acute phase of infection. The lowest concentration of SARS-CoV-2 viral copies this assay can detect is 250 copies / mL. A negative result does not preclude SARS-CoV-2 infection and should not be used as the sole basis for treatment or other patient management decisions.  A negative result may occur with improper specimen collection / handling, submission of specimen other than nasopharyngeal swab, presence of viral mutation(s) within the areas targeted by this assay, and inadequate number of viral copies (<250 copies / mL). A negative result must be combined with clinical observations, patient history, and epidemiological information.  Fact Sheet for Patients:   https://www.patel.info/  Fact Sheet for Healthcare Providers: https://hall.com/  This test is not yet approved or  cleared by the [place] FDA and has been authorized for detection and/or diagnosis of SARS-CoV-2 by FDA under an Emergency Use Authorization (EUA).  This EUA will remain in effect (meaning this test can be used) for the duration of the COVID-19 declaration under Section 564(b)(1) of the Act, 21 U.S.C. section  360bbb-3(b)(1), unless the authorization is terminated or revoked sooner.  Performed at [facility], [place] [address] [place], [place] [address]  Radiology Studies: No results found.    Bettyjean Stefanski T. Nazlini  If 7PM-7AM, please contact night-coverage [email or web address] 12/24/2021, 2:53 PM

## 2021-12-24 NOTE — Progress Notes (Signed)
[facility] Progress Note   Subjective:   Sleepy this AM, still feels like she has a cold. No other concerns reported.   Objective Vitals:   12/23/21 1745 12/23/21 1816 12/23/21 2123 12/24/21 0526  BP: (!) 113/44 (!) 136/54 (!) 110/36 (!) 127/47  Pulse: 74 72 80 74  Resp: 13 16 18 16   Temp: 97.7 F (36.5 C) 98.3 F (36.8 C) 99.2 F (37.3 C) 98.1 F (36.7 C)  TempSrc: Oral Oral Oral Oral  SpO2: 100% 100% 99% 100%  Weight: 121.8 kg     Height:       Physical Exam General: Chronically ill appearing morbidly obese female in NAD Heart: S1,S2, RRR No M/R/G Lungs: CTAB, normal WOB Abdomen: Non-distended, +BS Extremities:  no stump edema Dialysis Access: LIJ TDC drsg intact   Additional Objective Labs: Basic Metabolic Panel: Recent Labs  Lab 12/19/21 0309 12/23/21 1331  NA 138 135  K 4.0 3.4*  CL 99 97*  CO2 29 28  GLUCOSE 111* 135*  BUN 21* 28*  CREATININE 5.12* 5.77*  CALCIUM 9.3 8.7*  PHOS 4.6 4.6   Liver Function Tests: Recent Labs  Lab 12/19/21 0309 12/23/21 1331  ALBUMIN 2.5* 2.6*   No results for input(s): "LIPASE", "AMYLASE" in the last 168 hours. CBC: Recent Labs  Lab 12/19/21 0309 12/23/21 1331  WBC 14.2* 11.2*  HGB 7.5* 7.6*  HCT 24.8* 25.6*  MCV 99.2 100.0  PLT 267 316   Blood Culture    Component Value Date/Time   SDES BLOOD LEFT ANTECUBITAL 10/16/2021 1534   SPECREQUEST  10/16/2021 1534    BOTTLES DRAWN AEROBIC AND ANAEROBIC Blood Culture results may not be optimal due to an inadequate volume of blood received in culture bottles   CULT  10/16/2021 1534    NO GROWTH 5 DAYS Performed at [facility], [place] [address] [place], [place] [address]    REPTSTATUS 10/21/2021 FINAL 10/16/2021 1534    Cardiac Enzymes: No results for input(s): "CKTOTAL", "CKMB", "CKMBINDEX", "TROPONINI" in the last 168 hours. CBG: Recent Labs  Lab 12/19/21 0811 12/19/21 1144 12/19/21 1645 12/19/21 2038 12/20/21 0740  GLUCAP 81  124* 120* 123* 87   Iron Studies: No results for input(s): "IRON", "TIBC", "TRANSFERRIN", "FERRITIN" in the last 72 hours. @lablastinr3 @ Studies/Results: No results found. Medications:   aspirin EC  81 mg Oral Daily   atorvastatin  10 mg Oral Daily   darbepoetin (ARANESP) injection - DIALYSIS  200 mcg Intravenous Q Mon-HD   docusate sodium  100 mg Oral BID   feeding supplement  1 Container Oral TID BM   gabapentin  100 mg Oral Q1200   gabapentin  200 mg Oral QHS   heparin  5,000 Units Subcutaneous Q8H   levothyroxine  100 mcg Oral Q0600   loratadine  10 mg Oral Daily   lubiprostone  8 mcg Oral Q Tue   And   lubiprostone  8 mcg Oral Q Sat   midodrine  10 mg Oral Q M,W,F-HD   pantoprazole  40 mg Oral Daily   sevelamer carbonate  2.4 g Oral TID WC    Outpatient Dialysis Orders: MWF - [facility]  4hrs, BFR 400, DFR 600,  AVF, EDW 146kg, 3K/ 2Ca - last hep B labs 8/21 - Heparin 2500 units IV with HD - Hectorol 71mcg IV q HD, held as of 10/30/21  Assessment/Plan: ESRD: Back to MWF schedule this week , next HD tomorrow [facility] malfunction: Had required two [facility]  exchanges and having issues again on 9/4, got overnight tPA dwell. IR thinks TDC is positional as no fibrin on tip at last exchange. Discussed the need for a long term solution, VVS consulted - plan was for RUE AVF 12/19/21 but was postponed. Heparin boluses restarted with dialysis (will add mid treatment) BP/volume: BP variable, was on low dose metoprolol and midodine -> metop discontinued on 9/1. Last hoyer weight was 125kg which is down significantly from OP EDW -> lower on discharge. BP dropped pretty significantly with HD 9/11 - midodrine was given too early. Better now - follow timing with midodrine Chronic respiratory failure with hypoxia on cannula oxygen at home: on 5L O2 at baseline. Anemia of ESRD: Hgb 7.6 - continue high dose ESA q Monday while here. Transfuse < 7. Secondary hyperparathyroidism: CorrCa high,  VDRA on hold. 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 Hypokalemia: Recurrent issue recently and dislikes Klor-Con tabs. Using 3L bath with HD Severe sacral decubitus  wound: Per primary/wound care team. Disposition: Difficult situation. Prior to admit, patient required 6 staff members and 2 hoyer pads for transfer into HD chair as OP. Could not stay on dialysis for full treatment because of pain of multiple decubitus ulcers and PTAR unable to lift pt into ambulance for transport. SW has been unable to locate stretcher dialysis location so far. Her weight is much lower from admit, asked PT about trying to hoyer to recliner for HD -> apparently would need hoyer that wraps around her legs and there is concern for worsening her chronic wounds - unclear path at this time. Please see renal navigator note from 12/23/21    Anice Paganini, PA-C 12/24/2021, 8:53 AM  [facility] Pager: [phone]

## 2021-12-24 NOTE — TOC Progression Note (Signed)
Transition of Care [facility]) - Initial/Assessment Note    Patient Details  Name: Courtney Gray MRN: [id number] Date of Birth: 1971/02/18  Transition [facility]) CM/SW Contact:    Milinda Antis, [place] Phone Number: 12/24/2021, 1:36 PM  Clinical Narrative:                 CSW received returned call from admissions with [facility] facilities and was informed that there are no stretcher dialysis centers/ SNFs in [place].  Renal navigator has searched to no avail to locate facilities that will accept stretcher dialysis as well.  Patient's wish is for wounds to heal while inpatient so that patient can sit for dialysis and stay near daughter in [place].    TOC will continue to follow.    Expected Discharge Plan: Long Term Nursing Home Barriers to Discharge: No SNF bed, Transportation, Waiting for outpatient dialysis   Patient Goals and CMS Choice Patient states their goals for this hospitalization and ongoing recovery are:: To be at a facility close to her daughter      Expected Discharge Plan and Services Expected Discharge Plan: [place]       Living arrangements for the past 2 months: Single Family Home                                      Prior Living Arrangements/Services Living arrangements for the past 2 months: Single Family Home Lives with:: Adult Children Patient language and need for interpreter reviewed:: Yes        Need for Family Participation in Patient Care: Yes (Comment) Care giver support system in place?: No (comment)   Criminal Activity/Legal Involvement Pertinent to Current Situation/Hospitalization: No - Comment as needed  Activities of Daily Living Home Assistive Devices/Equipment: [employer] fast streamer ADL Screening (condition at time of admission) Patient's cognitive ability adequate to safely complete daily activities?: No Is the patient deaf or have difficulty hearing?: No Does the patient have difficulty seeing, even when wearing  glasses/contacts?: No Does the patient have difficulty concentrating, remembering, or making decisions?: No Patient able to express need for assistance with ADLs?: Yes Does the patient have difficulty dressing or bathing?: Yes Independently performs ADLs?: No Does the patient have difficulty walking or climbing stairs?: Yes Weakness of Legs: Both Weakness of Arms/Hands: Both  Permission Sought/Granted Permission sought to share information with : Other (comment) Permission granted to share information with : Yes, Verbal Permission Granted  Share Information with NAME: Nishida,antoinette (Daughter)   [phone]  Permission granted to share info w AGENCY: SNF        Emotional Assessment Appearance:: Appears older than stated age Attitude/Demeanor/Rapport: Engaged Affect (typically observed): Tearful/Crying, Accepting Orientation: : Oriented to Situation, Oriented to  Time, Oriented to Place, Oriented to Self Alcohol / Substance Use: Not Applicable Psych Involvement: No (comment)  Admission diagnosis:  End stage renal disease ([place]) [N18.6] Fluid overload [E87.70] Obesity with serious comorbidity, unspecified classification, unspecified obesity type [E66.9] Pressure injury of right buttock, stage 2 ([place]) [L89.312] Patient Active Problem List   Diagnosis Date Noted   Acute on chronic diastolic CHF (congestive heart failure) ([place]) 12/12/2021   PAD (peripheral artery disease) ([place]) 12/12/2021   Irritable bowel syndrome (IBS) 12/12/2021   Amputation of left middle finger 12/12/2021   GERD (gastroesophageal reflux disease) 12/12/2021   Hypertension    Type 2 diabetes mellitus with hyperlipidemia ([place])  Decubitus ulcer of sacral region, stage 2 (HCC)    Class 3 obesity (HCC)    Bacteremia    PVD (peripheral vascular disease) (HCC)    Hepatic abscess    Pressure injury of skin 09/13/2021   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[place])    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   Lactic  acidosis 09/10/2021   Mixed diabetic hyperlipidemia associated with type 2 diabetes mellitus ([place]) 09/10/2021   Coronary artery disease involving native coronary artery of native heart without angina pectoris 09/10/2021   Elevated troponin level not due myocardial infarction 09/10/2021   Hypotension 09/10/2021   Dry gangrene (HCC)    Pain 08/03/2021   Ischemic foot pain at rest 08/03/2021   Hypoglycemia 07/10/2021   Volume overload 07/09/2021   Leukocytosis 06/11/2021   Acute gout 06/11/2021   Hyperkalemia 05/21/2021   Hemorrhoids    Hyperlipidemia 05/18/2021   Rectal bleeding 05/17/2021   Morbid obesity with BMI of 60.0-69.9, adult ([place]) 05/17/2021   NSTEMI (non-ST elevated myocardial infarction) ([place]) 05/16/2021   Essential hypertension 09/01/2020   End stage renal disease ([place]) 09/01/2020   Chronic respiratory failure with hypoxia ([place]) 09/01/2020   Hypertensive emergency    Hypertensive urgency 11/28/2019   Uncontrolled type 2 diabetes mellitus with hypoglycemia, with long-term current use of insulin ([place]) 11/28/2019   Anemia due to chronic kidney disease 11/28/2019   PCP:  Monico Blitz, MD Pharmacy:   [place], [place] [address] [place] [address] Phone: [phone] Fax: [phone]  [facility] [address] [place] [address] Phone: [phone] Fax: [phone]  [place], [place] - [address] [place] [address] Phone: [phone] Fax: [phone]     Social Determinants of Health ([place]) Interventions    Readmission Risk Interventions    09/16/2021    4:36 PM 08/28/2021   11:50 AM 07/12/2021   12:38 PM  Readmission Risk Prevention Plan  Transportation Screening Complete Complete Complete  Medication Review (RN Care Manager)  Complete Complete  PCP or Specialist appointment within 3-5 days of discharge  Complete Complete   HRI or Home Care Consult Complete Complete Complete  SW Recovery Care/Counseling Consult Complete Complete Complete  Palliative Care Screening  Not Applicable Not Applicable  Skilled Nursing Facility Complete Not Applicable Complete

## 2021-12-24 NOTE — Progress Notes (Signed)
PT Cancellation Note  Patient Details Name: Courtney Gray MRN: [id number] DOB: 1970/12/31   Cancelled Treatment:    Reason Eval/Treat Not Completed: (P) Patient declined, no reason specified Pt refuses participation as she has an allergy cold, stomach ache, pain in her right thigh and just doesn't want to get out of bed.   Aleksandar Duve B. Migdalia Dk PT, DPT Acute Rehabilitation Services Please use secure chat or  Call Office [phone]   [place] 12/24/2021, 3:02 PM

## 2021-12-25 DIAGNOSIS — R7881 Bacteremia: Secondary | ICD-10-CM | POA: Diagnosis not present

## 2021-12-25 DIAGNOSIS — S68113A Complete traumatic metacarpophalangeal amputation of left middle finger, initial encounter: Secondary | ICD-10-CM | POA: Diagnosis not present

## 2021-12-25 DIAGNOSIS — N186 End stage renal disease: Secondary | ICD-10-CM | POA: Diagnosis not present

## 2021-12-25 LAB — RENAL FUNCTION PANEL
Albumin: 2.5 g/dL — ABNORMAL LOW (ref 3.5–5.0)
Anion gap: 14 (ref 5–15)
BUN: 40 mg/dL — ABNORMAL HIGH (ref 6–20)
CO2: 26 mmol/L (ref 22–32)
Calcium: 9.6 mg/dL (ref 8.9–10.3)
Chloride: 95 mmol/L — ABNORMAL LOW (ref 98–111)
Creatinine, Ser: 7.89 mg/dL — ABNORMAL HIGH (ref 0.44–1.00)
GFR, Estimated: 6 mL/min — ABNORMAL LOW (ref >60)
Glucose, Bld: 180 mg/dL — ABNORMAL HIGH (ref 70–99)
Phosphorus: 7.5 mg/dL — ABNORMAL HIGH (ref 2.5–4.6)
Potassium: 4.2 mmol/L (ref 3.5–5.1)
Sodium: 135 mmol/L (ref 135–145)

## 2021-12-25 LAB — CBC
HCT: 24.7 % — ABNORMAL LOW (ref 36.0–46.0)
Hemoglobin: 7.3 g/dL — ABNORMAL LOW (ref 12.0–15.0)
MCH: 29.7 pg (ref 26.0–34.0)
MCHC: 29.6 g/dL — ABNORMAL LOW (ref 30.0–36.0)
MCV: 100.4 fL — ABNORMAL HIGH (ref 80.0–100.0)
Platelets: 305 10*3/uL (ref 150–400)
RBC: 2.46 MIL/uL — ABNORMAL LOW (ref 3.87–5.11)
RDW: 15.2 % (ref 11.5–15.5)
WBC: 11 10*3/uL — ABNORMAL HIGH (ref 4.0–10.5)
nRBC: 0 % (ref 0.0–0.2)

## 2021-12-25 MED ORDER — HEPARIN SODIUM (PORCINE) 1000 UNIT/ML IJ SOLN
INTRAMUSCULAR | Status: AC
  Filled 2021-12-25: qty 4

## 2021-12-25 NOTE — Progress Notes (Signed)
Progress Note   Patient: Carlyle Achenbach [id number]:[id number] DOB: [birth year] DOA: 11/01/2021     54 DOS: the patient was seen and examined on 12/25/2021   Brief hospital course: 51 year old F with PMH of ESRD on HD TTS, recent hospitalization for bacteremia, hepatic and splenic abscess on long-term IV antibiotics, diastolic CHF, PVD s/p bilateral AKA, IDDM-2, morbid obesity and chronic sacral wound presenting with progressive shortness of breath in the setting of missed dialysis and admitted for hypertensive emergency.  Per patient, she never missed her dialysis however according to outpatient HD records, it takes at least 4 staff members and [facility] lift for patient to be seated in the dialysis chair.  Symptoms improved with hemodialysis.  Patient underwent R UE venogram on 8/30.  Vascular surgery following for possible RUE HD access.  Patient is now waiting placement where she can get HD on stretcher that has been difficult to find.  Assessment and Plan: ESRD on HD -Reportedly needed 4 people and [place] lift outpatient HD -Per vascular, patient postponed  aVF placement due to cold-like symptoms -Not able to find stretcher dialysis location.  Final plan on her HD unclear.   Chronic hypoxic respiratory failure: 3 L at baseline.  Now saturating at 100% on 2 L.  Reports saturation of 90. -Weaning off oxygen as tolerated -She may not need more than nocturnal oxygen.   Acute on chronic diastolic CHF/volume overload/hypertensive emergency -Nephrology managing volume with HD -Imdur and metoprolol on hold due to hypotension -Continue midodrine   Hypotension: Resolved.  -Continue midodrine -Continue holding Imdur and metoprolol.   Anemia of CKD: H&H relatively stable. -Could benefit from a unit of blood -IV iron per nephrology.   Controlled IDDM-2: A1c 5.3%.  Discontinued CBG monitoring and SSI.   Recent septic shock secondary to Bacteroides, bacteremia and liver, and splenic abscess-completed  antibiotic course.   Leukocytosis: Seems chronic but improved. -Monitor intermittently   PAD s/p bilateral AKA -Continue aspirin and statin. -Requested RN to replace mattress and bed   Hyperlipidemia -Statin.    Hypomagnesemia/hypokalemia/hyponatremia/hypophosphatemia -Per nephrology   Irritable bowel syndrome with constipation. Intractable nausea and vomiting -Continue Amitiza.   Sneezing, headache and fatigue: Likely allergy and oxygen.  COVID-19 and full RVP nonreactive.  -Continue Claritin -Wean oxygen   Morbid obesity Body mass index is 44.68 kg/m.       Subjective: Without complaints this AM  Physical Exam: Vitals:   12/24/21 1745 12/24/21 2037 12/25/21 0656 12/25/21 0942  BP: (!) 116/50 (!) 131/47 (!) 131/59 138/60  Pulse: 79 77 78 81  Resp: 17 18 18 17   Temp: 99 F (37.2 C) 98.1 F (36.7 C) 98.3 F (36.8 C) 98.2 F (36.8 C)  TempSrc: Oral Oral    SpO2: 98% 96% 100% 100%  Weight:      Height:       General exam: Awake, laying in bed, in nad Respiratory system: Normal respiratory effort, no wheezing Cardiovascular system: regular rate, s1, s2 Gastrointestinal system: Soft, nondistended, positive BS Central nervous system: CN2-12 grossly intact, strength intact Extremities: s/p LE amputations Skin: Normal skin turgor, no notable skin lesions seen Psychiatry: Mood normal // no visual hallucinations   Data Reviewed:  Labs reviewed: Na 135, K 3.4, Hgb 7.6   Family Communication: Pt in room, family not at bedside  Disposition: Status is: Inpatient Remains inpatient appropriate because: Severity of illness  Planned Discharge Destination:  Unclear at this time     Author: Marylu Lund, MD 12/25/2021 1:23 PM  For  on call review [email or web address].

## 2021-12-25 NOTE — Progress Notes (Signed)
Attempted to call report to bring patient to dialysis. Patient refusing to come to HD. Will attempt again later.

## 2021-12-25 NOTE — Progress Notes (Addendum)
[facility] Progress Note   Subjective: Seen in room. Very pleasant, no C/Os. HD later this afternoon.     Objective Vitals:   12/24/21 1745 12/24/21 2037 12/25/21 0656 12/25/21 0942  BP: (!) 116/50 (!) 131/47 (!) 131/59 138/60  Pulse: 79 77 78 81  Resp: 17 18 18 17   Temp: 99 F (37.2 C) 98.1 F (36.7 C) 98.3 F (36.8 C) 98.2 F (36.8 C)  TempSrc: Oral Oral    SpO2: 98% 96% 100% 100%  Weight:      Height:       Physical Exam General: Chronically ill appearing morbidly obese female in NAD Heart: S1,S2, RRR No M/R/G Lungs: CTAB, no WOB Abdomen: Obese, NABS Extremities: Bilateral AKA no stump edema Dialysis Access: LIJ TDC drsg intact    Additional Objective Labs: Basic Metabolic Panel: Recent Labs  Lab 12/19/21 0309 12/23/21 1331  NA 138 135  K 4.0 3.4*  CL 99 97*  CO2 29 28  GLUCOSE 111* 135*  BUN 21* 28*  CREATININE 5.12* 5.77*  CALCIUM 9.3 8.7*  PHOS 4.6 4.6   Liver Function Tests: Recent Labs  Lab 12/19/21 0309 12/23/21 1331  ALBUMIN 2.5* 2.6*   No results for input(s): "LIPASE", "AMYLASE" in the last 168 hours. CBC: Recent Labs  Lab 12/19/21 0309 12/23/21 1331  WBC 14.2* 11.2*  HGB 7.5* 7.6*  HCT 24.8* 25.6*  MCV 99.2 100.0  PLT 267 316   Blood Culture    Component Value Date/Time   SDES BLOOD LEFT ANTECUBITAL 10/16/2021 1534   SPECREQUEST  10/16/2021 1534    BOTTLES DRAWN AEROBIC AND ANAEROBIC Blood Culture results may not be optimal due to an inadequate volume of blood received in culture bottles   CULT  10/16/2021 1534    NO GROWTH 5 DAYS Performed at [facility], [place] [address] [place], [place] [address]    REPTSTATUS 10/21/2021 FINAL 10/16/2021 1534    Cardiac Enzymes: No results for input(s): "CKTOTAL", "CKMB", "CKMBINDEX", "TROPONINI" in the last 168 hours. CBG: Recent Labs  Lab 12/19/21 0811 12/19/21 1144 12/19/21 1645 12/19/21 2038 12/20/21 0740  GLUCAP 81 124* 120* 123* 87   Iron Studies:  No results for input(s): "IRON", "TIBC", "TRANSFERRIN", "FERRITIN" in the last 72 hours. @lablastinr3 @ Studies/Results: No results found. Medications:   aspirin EC  81 mg Oral Daily   atorvastatin  10 mg Oral Daily   darbepoetin (ARANESP) injection - DIALYSIS  200 mcg Intravenous Q Mon-HD   docusate sodium  100 mg Oral BID   feeding supplement  1 Container Oral TID BM   gabapentin  100 mg Oral Q1200   gabapentin  200 mg Oral QHS   heparin  5,000 Units Subcutaneous Q8H   levothyroxine  100 mcg Oral Q0600   loratadine  10 mg Oral Daily   lubiprostone  8 mcg Oral Q Tue   And   lubiprostone  8 mcg Oral Q Sat   midodrine  10 mg Oral Q M,W,F-HD   pantoprazole  40 mg Oral Daily   sevelamer carbonate  2.4 g Oral TID WC     Outpatient Dialysis Orders: MWF - [facility]  4hrs, BFR 400, DFR 600,  AVF, EDW 146kg, 3K/ 2Ca - last hep B labs 8/21 - Heparin 2500 units IV with HD - Hectorol 58mcg IV q HD, held as of 10/30/21   Assessment/Plan: ESRD:MWF HD today on schedule. Next HD 12/27/2021 [facility] malfunction: Had required two [facility] exchanges and having issues  again on 9/4, got overnight tPA dwell. IR thinks TDC is positional as no fibrin on tip at last exchange. Discussed the need for a long term solution, VVS consulted - plan was for RUE AVF 12/19/21 but was postponed. Heparin boluses restarted with dialysis (will add mid treatment) BP/volume: BP variable, was on low dose metoprolol and midodine -> metop discontinued on 9/1. Last hoyer weight was 125kg which is down significantly from OP EDW -> lower on discharge. BP dropped pretty significantly with HD 9/11 - midodrine was given too early. Better now - follow timing with midodrine Chronic respiratory failure with hypoxia on cannula oxygen at home: on 5L O2 at baseline. Anemia of ESRD: Hgb 7.6 - continue high dose ESA q Monday while here. Transfuse < 7. Secondary hyperparathyroidism: CorrCa high, VDRA on hold. Renvela increased - Phos  now at goal PAD s/p bilateral AKA, s/p L 3rd finger amputation for gangrene. Hypokalemia: Recurrent issue recently and dislikes Klor-Con tabs. Using 3L bath with HD Severe sacral decubitus  wound: Per primary/wound care team. Disposition: Difficult situation. Prior to admit, patient required 6 staff members and 2 hoyer pads for transfer into HD chair as OP. Could not stay on dialysis for full treatment because of pain of multiple decubitus ulcers and PTAR unable to lift pt into ambulance for transport. SW has been unable to locate stretcher dialysis location so far. Her weight is much lower from admit, asked PT about trying to hoyer to recliner for HD -> apparently would need hoyer that wraps around her legs and there is concern for worsening her chronic wounds - unclear path at this time. Please see renal navigator note from 12/23/21    Jimmye Norman. Merri Dimaano NP-C 12/25/2021, 1:15 PM  [employer] [phone]

## 2021-12-25 NOTE — Progress Notes (Signed)
Received patient in bed to unit.  Alert and oriented.  Informed consent signed and in chart.   Treatment initiated: 1606 Treatment completed: 2036  Patient tolerated well.  Transported back to the room  Alert, without acute distress.  Hand-off given to patient's nurse.   Access used: CATHETER Access issues: NONE  Total UF removed: 2000 MLS Medication(s) given: NONE Post HD VS: 98 126/50 67 16 100%  3L Post HD weight: 77 KG   [facility]

## 2021-12-26 DIAGNOSIS — N1832 Chronic kidney disease, stage 3b: Secondary | ICD-10-CM | POA: Diagnosis not present

## 2021-12-26 DIAGNOSIS — S68113A Complete traumatic metacarpophalangeal amputation of left middle finger, initial encounter: Secondary | ICD-10-CM | POA: Diagnosis not present

## 2021-12-26 DIAGNOSIS — D631 Anemia in chronic kidney disease: Secondary | ICD-10-CM | POA: Diagnosis not present

## 2021-12-26 DIAGNOSIS — N186 End stage renal disease: Secondary | ICD-10-CM | POA: Diagnosis not present

## 2021-12-26 MED ORDER — DIPHENHYDRAMINE-ZINC ACETATE 2-0.1 % EX CREA
TOPICAL_CREAM | Freq: Two times a day (BID) | CUTANEOUS | Status: DC | PRN
  Filled 2021-12-26 (×2): qty 28

## 2021-12-26 NOTE — Plan of Care (Signed)
  Problem: Skin Integrity: Goal: Risk for impaired skin integrity will decrease Outcome: Progressing   

## 2021-12-26 NOTE — Progress Notes (Signed)
Progress Note   Patient: Courtney Gray [id number] DOB: 1971/01/08 DOA: 11/01/2021     55 DOS: the patient was seen and examined on 12/26/2021   Brief hospital course: 51 year old F with PMH of ESRD on HD TTS, recent hospitalization for bacteremia, hepatic and splenic abscess on long-term IV antibiotics, diastolic CHF, PVD s/p bilateral AKA, IDDM-2, morbid obesity and chronic sacral wound presenting with progressive shortness of breath in the setting of missed dialysis and admitted for hypertensive emergency.  Per patient, she never missed her dialysis however according to outpatient HD records, it takes at least 4 staff members and [facility] lift for patient to be seated in the dialysis chair.  Symptoms improved with hemodialysis.  Patient underwent R UE venogram on 8/30.  Vascular surgery following for possible RUE HD access.  Patient is now waiting placement where she can get HD on stretcher that has been difficult to find.  Assessment and Plan: ESRD on HD -Reportedly needed 4 people and [place] lift outpatient HD -Per vascular, patient postponed  aVF placement due to cold-like symptoms -Not able to find stretcher dialysis location.  Final plan on her HD unclear. -Cont with PT as tolerated   Chronic hypoxic respiratory failure: 3 L at baseline.  Now saturating at 100% on 2 L.  Reports saturation of 90. -Weaning off oxygen as tolerated -She may not need more than nocturnal oxygen.   Acute on chronic diastolic CHF/volume overload/hypertensive emergency -Nephrology managing volume with HD -Imdur and metoprolol on hold due to hypotension -Continue midodrine   Hypotension: Resolved.  -Continue midodrine -Continue holding Imdur and metoprolol.   Anemia of CKD: H&H relatively stable. -Could benefit from a unit of blood -IV iron per nephrology.   Controlled IDDM-2: A1c 5.3%.  Discontinued CBG monitoring and SSI.   Recent septic shock secondary to Bacteroides, bacteremia and liver, and  splenic abscess-completed antibiotic course.   Leukocytosis: Seems chronic but improved. -Monitor intermittently   PAD s/p bilateral AKA -Continue aspirin and statin. -Requested RN to replace mattress and bed   Hyperlipidemia -Statin.    Hypomagnesemia/hypokalemia/hyponatremia/hypophosphatemia -Per nephrology   Irritable bowel syndrome with constipation. Intractable nausea and vomiting -Continue Amitiza.   Sneezing, headache and fatigue: Likely allergy and oxygen.  COVID-19 and full RVP nonreactive.  -Continue Claritin -Wean oxygen   Morbid obesity Body mass index is 44.68 kg/m.       Subjective: Complains of rash over face this AM  Physical Exam: Vitals:   12/25/21 2000 12/25/21 2036 12/26/21 0523 12/26/21 1006  BP: (!) 131/37 (!) 126/50 (!) 124/52 117/68  Pulse: 67 68 76 81  Resp: (!) 24 16 18 19   Temp:  98 F (36.7 C) 98.4 F (36.9 C) 98.4 F (36.9 C)  TempSrc:  Oral Oral Oral  SpO2: 90% 100%  93%  Weight:  117 kg    Height:       General exam: Conversant, in no acute distress Respiratory system: normal chest rise, clear, no audible wheezing Cardiovascular system: regular rhythm, s1-s2 Gastrointestinal system: Nondistended, nontender, pos BS Central nervous system: No seizures, no tremors Extremities: No cyanosis, no joint deformities Skin: Non-pruritic and non-erythematous rash over face Psychiatry: Affect normal // no auditory hallucinations   Data Reviewed:  There are no new results to review at this time.  Family Communication: Pt in room, family not at bedside  Disposition: Status is: Inpatient Remains inpatient appropriate because: Severity of illness  Planned Discharge Destination:  Unclear at this time     Author: Annie Main  Wyline Copas, MD 12/26/2021 12:27 PM  For on call review [email or web address].

## 2021-12-26 NOTE — Progress Notes (Signed)
Physical Therapy Treatment Patient Details Name: Courtney Gray MRN: [id number] DOB: 1970/12/18 Today's Date: 12/26/2021   History of Present Illness Pt adm 7/28 with acute pulmonary edema. 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    PT Comments    Patient agreeable to try OOB to wheelchair for wheelchair training and strengthening. Rolling with less assist as placing maxisky amputee pad underneath pt for lift to chair. Patient with Roho cushion plus blue air cushion (pt's request) under Rt ischium for skin/wound protection and folded/doubled-over blue air cushion under Left ischium. Unfortunately, this did not level her out and she continued with left lateral lean causing some difficulty with reaching right wheel for propulsion. Patient able to figure out how to compensate for lean and with trial and error able to make the wheelchair propel straight forward instead of off to the right. End of session, lifted pt back to bed due to increasing pain in right ischial wound.    Recommendations for follow up therapy are one component of a multi-disciplinary discharge planning process, led by the attending physician.  Recommendations may be updated based on patient status, additional functional criteria and insurance authorization.  Follow Up Recommendations  Skilled nursing-short term rehab (<3 hours/day) Can patient physically be transported by private vehicle: No   Assistance Recommended at Discharge Frequent or constant Supervision/Assistance  Patient can return home with the following Two people to help with walking and/or transfers;Two people to help with bathing/dressing/bathroom;Assist for transportation;Assistance with cooking/housework;Help with stairs or ramp for entrance   Equipment Recommendations  Wheelchair (measurements PT);Wheelchair cushion (measurements PT);Other (comment);Hospital bed (Needs  wide w/c with cushion and anti-tippers with ?offset wheels to reduce risk of tipping backwards)    Recommendations for Other Services       Precautions / Restrictions Precautions Precautions: Fall Precaution Comments: bil AKA, 3L O2 at baseline, LUQ drain Restrictions Weight Bearing Restrictions: Yes RLE Weight Bearing: Non weight bearing LLE Weight Bearing: Non weight bearing Other Position/Activity Restrictions: minimized pushing/pulling and weight through L hand, dressing now removed     Mobility  Bed Mobility Overal bed mobility: Needs Assistance Bed Mobility: Rolling Rolling: Min assist, Min guard         General bed mobility comments: minguard to roll to right; min assist to roll to left (with rails)    Transfers Overall transfer level: Needs assistance Equipment used:  (maxisky) Transfers: Bed to chair/wheelchair/BSC             General transfer comment: lifted to and from wheelchair with [employer] and Therapist, sports (with amputee lift pad)  Ambulation/Gait               General Gait Details: unable   Theme park manager mobility: Yes Wheelchair propulsion: Both upper extremities (unable to propel due to larger wheelchair and could not reach wheels, tolerated up in wheelchair for  25) Wheelchair parts: Needs assistance Distance: 40 (seated rest, 40, seated rest, 25 ft) Wheelchair Assistance Details (indicate cue type and reason): worked on sitting tolerance with Rojo cushion + blue air cushion under Rt ischium and folded/doubled-over blue cushion under left ischium per pt's request; pt continued with left lateral lean and reports she thinks is due to unlevel cushions and not her voluntarily leaning left to offload her rt ischium  Modified Rankin (Stroke Patients Only)       Balance Overall balance assessment: Needs assistance Sitting-balance support: No  upper extremity supported Sitting balance-[name] Scale: Good Sitting balance - Comments: in wheelchair; pt requests chest strap (made with gait belts) due to fear of falling                                    Cognition Arousal/Alertness: Awake/alert Behavior During Therapy: WFL for tasks assessed/performed Overall Cognitive Status: Within Functional Limits for tasks assessed                                          Exercises      General Comments        Pertinent Vitals/Pain Pain Assessment Pain Assessment: 0-10 Pain Score: 7  Pain Location: R ischial pressure wound Pain Descriptors / Indicators: Guarding, Sore Pain Intervention(s): Limited activity within patient's tolerance, Monitored during session, Repositioned    Home Living                          Prior Function            PT Goals (current goals can now be found in the care plan section) Acute Rehab PT Goals Patient Stated Goal: Pt wants to get up into a w/c PT Goal Formulation: With patient Time For Goal Achievement: 01/09/22 Potential to Achieve Goals: Fair Progress towards PT goals:  (goals updated)    Frequency    Min 2X/week      PT Plan Current plan remains appropriate;Frequency needs to be updated    Co-evaluation              AM-PAC PT "6 Clicks" Mobility   Outcome Measure  Help needed turning from your back to your side while in a flat bed without using bedrails?: A Little Help needed moving from lying on your back to sitting on the side of a flat bed without using bedrails?: Total Help needed moving to and from a bed to a chair (including a wheelchair)?: Total Help needed standing up from a chair using your arms (e.g., wheelchair or bedside chair)?: Total Help needed to walk in hospital room?: Total Help needed climbing 3-5 steps with a railing? : Total 6 Click Score: 8    End of Session Equipment Utilized During Treatment:  Oxygen Activity Tolerance: Patient tolerated treatment well Patient left: with call bell/phone within reach;in bed;with nursing/sitter in room;Other (comment) (mobility tech to help with positioning for wound care) Nurse Communication: Mobility status PT Visit Diagnosis: Other abnormalities of gait and mobility (R26.89);Muscle weakness (generalized) (M62.81) Pain - Right/Left: Right Pain - part of body:  (ischial wound)     Time: 9563-8756 PT Time Calculation (min) (ACUTE ONLY): 54 min  Charges:  $Therapeutic Activity: 23-37 mins $Wheel Chair Management: 23-37 mins                      Arby Barrette, PT Acute Rehabilitation Services  Office [phone]    [name] 12/26/2021, 4:04 PM

## 2021-12-26 NOTE — Progress Notes (Signed)
[facility] Progress Note   Subjective: Seen in room. No C/O. Says she has over 50 lbs and is very excited and happy. HD tomorrow on schedule.   Objective Vitals:   12/25/21 2000 12/25/21 2036 12/26/21 0523 12/26/21 1006  BP: (!) 131/37 (!) 126/50 (!) 124/52 117/68  Pulse: 67 68 76 81  Resp: (!) 24 16 18 19   Temp:  98 F (36.7 C) 98.4 F (36.9 C) 98.4 F (36.9 C)  TempSrc:  Oral Oral Oral  SpO2: 90% 100%  93%  Weight:  117 kg    Height:       Physical Exam General: Chronically ill appearing morbidly obese female in NAD Heart: S1,S2, RRR No M/R/G Lungs: CTAB, no WOB Abdomen: Obese, NABS Extremities: Bilateral AKA no stump edema Dialysis Access: LIJ TDC drsg intact   Additional Objective Labs: Basic Metabolic Panel: Recent Labs  Lab 12/23/21 1331 12/25/21 1610  NA 135 135  K 3.4* 4.2  CL 97* 95*  CO2 28 26  GLUCOSE 135* 180*  BUN 28* 40*  CREATININE 5.77* 7.89*  CALCIUM 8.7* 9.6  PHOS 4.6 7.5*   Liver Function Tests: Recent Labs  Lab 12/23/21 1331 12/25/21 1610  ALBUMIN 2.6* 2.5*   No results for input(s): "LIPASE", "AMYLASE" in the last 168 hours. CBC: Recent Labs  Lab 12/23/21 1331 12/25/21 1610  WBC 11.2* 11.0*  HGB 7.6* 7.3*  HCT 25.6* 24.7*  MCV 100.0 100.4*  PLT 316 305   Blood Culture    Component Value Date/Time   SDES BLOOD LEFT ANTECUBITAL 10/16/2021 1534   SPECREQUEST  10/16/2021 1534    BOTTLES DRAWN AEROBIC AND ANAEROBIC Blood Culture results may not be optimal due to an inadequate volume of blood received in culture bottles   CULT  10/16/2021 1534    NO GROWTH 5 DAYS Performed at [facility], [place] [address] [place], [place] [address]    REPTSTATUS 10/21/2021 FINAL 10/16/2021 1534    Cardiac Enzymes: No results for input(s): "CKTOTAL", "CKMB", "CKMBINDEX", "TROPONINI" in the last 168 hours. CBG: Recent Labs  Lab 12/19/21 1645 12/19/21 2038 12/20/21 0740  GLUCAP 120* 123* 87   Iron Studies: No  results for input(s): "IRON", "TIBC", "TRANSFERRIN", "FERRITIN" in the last 72 hours. @lablastinr3 @ Studies/Results: No results found. Medications:   aspirin EC  81 mg Oral Daily   atorvastatin  10 mg Oral Daily   darbepoetin (ARANESP) injection - DIALYSIS  200 mcg Intravenous Q Mon-HD   docusate sodium  100 mg Oral BID   feeding supplement  1 Container Oral TID BM   gabapentin  100 mg Oral Q1200   gabapentin  200 mg Oral QHS   heparin  5,000 Units Subcutaneous Q8H   levothyroxine  100 mcg Oral Q0600   loratadine  10 mg Oral Daily   lubiprostone  8 mcg Oral Q Tue   And   lubiprostone  8 mcg Oral Q Sat   midodrine  10 mg Oral Q M,W,F-HD   pantoprazole  40 mg Oral Daily   sevelamer carbonate  2.4 g Oral TID WC     Outpatient Dialysis Orders: MWF - [facility]  4hrs, BFR 400, DFR 600,  AVF, EDW 146kg, 3K/ 2Ca - last hep B labs 8/21 - Heparin 2500 units IV with HD - Hectorol 65mcg IV q HD, held as of 10/30/21   Assessment/Plan: ESRD:MWF HD today on schedule. Next HD 12/27/2021 [facility] malfunction: Had required two [facility] exchanges and having issues  again on 9/4, got overnight tPA dwell. IR thinks TDC is positional as no fibrin on tip at last exchange. Discussed the need for a long term solution, VVS consulted - plan was for RUE AVF 12/19/21 but was postponed. Heparin boluses restarted with dialysis (will add mid treatment) BP/volume: BP variable, was on low dose metoprolol and midodine -> metop discontinued on 9/1. Last hoyer weight was 125kg which is down significantly from OP EDW -> lower on discharge. BP dropped pretty significantly with HD 9/11 - midodrine was given too early. Better now - follow timing with midodrine Chronic respiratory failure with hypoxia on cannula oxygen at home: on 5L O2 at baseline. Anemia of ESRD: Hgb 7.6 - continue high dose ESA q Monday while here. Transfuse < 7. Secondary hyperparathyroidism: CorrCa high, VDRA on hold. 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 Hypokalemia: Recurrent issue recently and dislikes Klor-Con tabs. Using 3L bath with HD Severe sacral decubitus  wound: Per primary/wound care team. Disposition: Difficult situation. Prior to admit, patient required 6 staff members and 2 hoyer pads for transfer into HD chair as OP. Could not stay on dialysis for full treatment because of pain of multiple decubitus ulcers and PTAR unable to lift pt into ambulance for transport. SW has been unable to locate stretcher dialysis location so far. Her weight is much lower from admit, asked PT about trying to hoyer to recliner for HD -> apparently would need hoyer that wraps around her legs and there is concern for worsening her chronic wounds - unclear path at this time. Please see renal navigator note from 12/23/21  Jimmye Norman. Daryl Beehler NP-C 12/26/2021, 12:14 PM  [employer] [phone]

## 2021-12-26 NOTE — Progress Notes (Signed)
Mobility Specialist Progress Note:   12/26/21 1500  Mobility  Activity Turned to left side;Turned to right side;Turned to back - supine  Level of Assistance Minimal assist, patient does 75% or more  Activity Response Tolerated well  $Mobility charge 1 Mobility   RN requesting assistance to turn pt for dressing change. MinA needed for turns. Pt left with all needs met.   Nelta Numbers Acute Rehab Secure Chat or Office Phone: [phone]

## 2021-12-27 DIAGNOSIS — S68113A Complete traumatic metacarpophalangeal amputation of left middle finger, initial encounter: Secondary | ICD-10-CM | POA: Diagnosis not present

## 2021-12-27 DIAGNOSIS — N186 End stage renal disease: Secondary | ICD-10-CM | POA: Diagnosis not present

## 2021-12-27 DIAGNOSIS — R7881 Bacteremia: Secondary | ICD-10-CM | POA: Diagnosis not present

## 2021-12-27 LAB — RENAL FUNCTION PANEL
Albumin: 2.6 g/dL — ABNORMAL LOW (ref 3.5–5.0)
Anion gap: 15 (ref 5–15)
BUN: 38 mg/dL — ABNORMAL HIGH (ref 6–20)
CO2: 24 mmol/L (ref 22–32)
Calcium: 9.6 mg/dL (ref 8.9–10.3)
Chloride: 94 mmol/L — ABNORMAL LOW (ref 98–111)
Creatinine, Ser: 7.73 mg/dL — ABNORMAL HIGH (ref 0.44–1.00)
GFR, Estimated: 6 mL/min — ABNORMAL LOW (ref >60)
Glucose, Bld: 180 mg/dL — ABNORMAL HIGH (ref 70–99)
Phosphorus: 7.1 mg/dL — ABNORMAL HIGH (ref 2.5–4.6)
Potassium: 4.3 mmol/L (ref 3.5–5.1)
Sodium: 133 mmol/L — ABNORMAL LOW (ref 135–145)

## 2021-12-27 LAB — CBC
HCT: 24.8 % — ABNORMAL LOW (ref 36.0–46.0)
Hemoglobin: 7.3 g/dL — ABNORMAL LOW (ref 12.0–15.0)
MCH: 30 pg (ref 26.0–34.0)
MCHC: 29.4 g/dL — ABNORMAL LOW (ref 30.0–36.0)
MCV: 102.1 fL — ABNORMAL HIGH (ref 80.0–100.0)
Platelets: 303 10*3/uL (ref 150–400)
RBC: 2.43 MIL/uL — ABNORMAL LOW (ref 3.87–5.11)
RDW: 15.4 % (ref 11.5–15.5)
WBC: 12.7 10*3/uL — ABNORMAL HIGH (ref 4.0–10.5)
nRBC: 0 % (ref 0.0–0.2)

## 2021-12-27 MED ORDER — ALTEPLASE 2 MG IJ SOLR: 2.0000 mg | Freq: Once | INTRAMUSCULAR | Status: DC | PRN

## 2021-12-27 MED ORDER — LIDOCAINE-PRILOCAINE 2.5-2.5 % EX CREA: 1.0000 | TOPICAL_CREAM | CUTANEOUS | Status: DC | PRN

## 2021-12-27 MED ORDER — ANTICOAGULANT SODIUM CITRATE 4% (200MG/5ML) IV SOLN: 5.0000 mL | Status: DC | PRN

## 2021-12-27 MED ORDER — HEPARIN SODIUM (PORCINE) 1000 UNIT/ML IJ SOLN
INTRAMUSCULAR | Status: AC
  Filled 2021-12-27: qty 4

## 2021-12-27 MED ORDER — LIDOCAINE HCL (PF) 1 % IJ SOLN: 5.0000 mL | INTRAMUSCULAR | Status: DC | PRN

## 2021-12-27 MED ORDER — HEPARIN SODIUM (PORCINE) 1000 UNIT/ML DIALYSIS
1000.0000 [IU] | INTRAMUSCULAR | Status: DC | PRN
  Administered 2021-12-27: 1000 [IU]

## 2021-12-27 MED ORDER — PENTAFLUOROPROP-TETRAFLUOROETH EX AERO: 1.0000 | INHALATION_SPRAY | CUTANEOUS | Status: DC | PRN

## 2021-12-27 MED ORDER — SODIUM CHLORIDE 0.9 % IV SOLN
125.0000 mg | INTRAVENOUS | Status: DC
  Administered 2021-12-27: 125 mg via INTRAVENOUS
  Filled 2021-12-27 (×4): qty 10

## 2021-12-27 MED ORDER — HEPARIN SODIUM (PORCINE) 1000 UNIT/ML DIALYSIS
20.0000 [IU]/kg | INTRAMUSCULAR | Status: DC | PRN
  Filled 2021-12-27: qty 3

## 2021-12-27 NOTE — Progress Notes (Signed)
[facility] Progress Note   Subjective:   Report she still feels congested but denies SOB and cough. No CP, palpitations or dizziness. Says she wants to wait until her daughter has a day off of work to do her AVF surgery. Also requests HD in the afternoons   Objective Vitals:   12/26/21 1731 12/26/21 2151 12/27/21 0619 12/27/21 0948  BP: (!) 120/54 (!) 122/50 (!) 109/38 (!) 96/47  Pulse: 80 81 76 74  Resp: 19 19 18 18   Temp: 98.7 F (37.1 C) 98.4 F (36.9 C) 98.4 F (36.9 C) 98.4 F (36.9 C)  TempSrc:  Oral    SpO2: 100% 97% 100% 98%  Weight:      Height:       Physical Exam General: Alert female on NAD Heart: RRR, no murmurs, rubs or gallops Lungs: CTA bilaterally, no increased WOB Abdomen: Soft, non-distended, +BS Extremities: B/l AKA, no stump edema Dialysis Access:  L IJ [facility]  Additional Objective Labs: Basic Metabolic Panel: Recent Labs  Lab 12/23/21 1331 12/25/21 1610  NA 135 135  K 3.4* 4.2  CL 97* 95*  CO2 28 26  GLUCOSE 135* 180*  BUN 28* 40*  CREATININE 5.77* 7.89*  CALCIUM 8.7* 9.6  PHOS 4.6 7.5*   Liver Function Tests: Recent Labs  Lab 12/23/21 1331 12/25/21 1610  ALBUMIN 2.6* 2.5*   No results for input(s): "LIPASE", "AMYLASE" in the last 168 hours. CBC: Recent Labs  Lab 12/23/21 1331 12/25/21 1610  WBC 11.2* 11.0*  HGB 7.6* 7.3*  HCT 25.6* 24.7*  MCV 100.0 100.4*  PLT 316 305   Blood Culture    Component Value Date/Time   SDES BLOOD LEFT ANTECUBITAL 10/16/2021 1534   SPECREQUEST  10/16/2021 1534    BOTTLES DRAWN AEROBIC AND ANAEROBIC Blood Culture results may not be optimal due to an inadequate volume of blood received in culture bottles   CULT  10/16/2021 1534    NO GROWTH 5 DAYS Performed at [facility], [place] [address], [place], [place] [address]    REPTSTATUS 10/21/2021 FINAL 10/16/2021 1534    Cardiac Enzymes: No results for input(s): "CKTOTAL", "CKMB", "CKMBINDEX", "TROPONINI" in the last 168  hours. CBG: No results for input(s): "GLUCAP" in the last 168 hours. Iron Studies: No results for input(s): "IRON", "TIBC", "TRANSFERRIN", "FERRITIN" in the last 72 hours. @lablastinr3 @ Studies/Results: No results found. Medications:   aspirin EC  81 mg Oral Daily   atorvastatin  10 mg Oral Daily   darbepoetin (ARANESP) injection - DIALYSIS  200 mcg Intravenous Q Mon-HD   docusate sodium  100 mg Oral BID   feeding supplement  1 Container Oral TID BM   gabapentin  100 mg Oral Q1200   gabapentin  200 mg Oral QHS   heparin  5,000 Units Subcutaneous Q8H   levothyroxine  100 mcg Oral Q0600   loratadine  10 mg Oral Daily   lubiprostone  8 mcg Oral Q Tue   And   lubiprostone  8 mcg Oral Q Sat   midodrine  10 mg Oral Q M,W,F-HD   pantoprazole  40 mg Oral Daily   sevelamer carbonate  2.4 g Oral TID WC    Outpatient Dialysis Orders: MWF - [place]  4hrs, BFR 400, DFR 600,  AVF, EDW 146kg, 3K/ 2Ca - last hep B labs 8/21 - Heparin 2500 units IV with HD - Hectorol 59mcg IV q HD, held as of 10/30/21    Assessment/Plan: ESRD:MWF HD  today on schedule. Next HD 12/27/2021- requested afternoon HD [facility] malfunction: Had required two [facility] exchanges and having issues again on 9/4, got overnight tPA dwell. IR thinks TDC is positional as no fibrin on tip at last exchange. Discussed the need for a long term solution, VVS consulted - plan was for RUE AVF 12/19/21 but was postponed. Heparin boluses restarted with dialysis  BP/volume: BP variable, was on low dose metoprolol and midodine -> metop discontinued on 9/1. Last hoyer weight was 125kg which is down significantly from OP EDW -> lower on discharge. BP dropped pretty significantly with HD 9/11 - midodrine was given too early. Chronic respiratory failure with hypoxia on cannula oxygen at home: on 5L O2 at baseline, have been able to titrate down O2 to 2L Anemia of ESRD: Hgb 7.3 - continue high dose ESA q Monday while here. Tsat was 16 on  9/14 with ferritin 1287, will start short course of IV Fe. Transfuse < 7. Secondary hyperparathyroidism: CorrCa high, VDRA on hold. 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 Hypokalemia: Recurrent issue recently and dislikes Klor-Con tabs. Using 3K bath with HD Severe sacral decubitus  wound: Per primary/wound care team. Disposition: Difficult situation. Prior to admit, patient required 6 staff members and 2 hoyer pads for transfer into HD chair as OP. Could not stay on dialysis for full treatment because of pain of multiple decubitus ulcers and PTAR unable to lift pt into ambulance for transport. SW has been unable to locate stretcher dialysis location so far. Her weight is much lower from admit, asked PT about trying to hoyer to recliner for HD -> apparently would need hoyer that wraps around her legs and patient is only able to tolerate <1 hour sitting up at this point. Please see renal navigator note from 12/23/21  Anice Paganini, PA-C 12/27/2021, 9:52 AM  [facility] Pager: [phone]

## 2021-12-27 NOTE — Plan of Care (Signed)
°  Problem: Clinical Measurements: °Goal: Respiratory complications will improve °Outcome: Progressing °Goal: Cardiovascular complication will be avoided °Outcome: Progressing °  °Problem: Activity: °Goal: Risk for activity intolerance will decrease °Outcome: Progressing °  °Problem: Nutrition: °Goal: Adequate nutrition will be maintained °Outcome: Progressing °  °Problem: Pain Managment: °Goal: General experience of comfort will improve °Outcome: Progressing °  °

## 2021-12-27 NOTE — Progress Notes (Signed)
Progress Note   Patient: Courtney Gray DZH:[id number] DOB: [birth year] DOA: 11/01/2021     56 DOS: the patient was seen and examined on 12/27/2021   Brief hospital course: 51 year old F with PMH of ESRD on HD TTS, recent hospitalization for bacteremia, hepatic and splenic abscess on long-term IV antibiotics, diastolic CHF, PVD s/p bilateral AKA, IDDM-2, morbid obesity and chronic sacral wound presenting with progressive shortness of breath in the setting of missed dialysis and admitted for hypertensive emergency.  Per patient, she never missed her dialysis however according to outpatient HD records, it takes at least 4 staff members and [facility] lift for patient to be seated in the dialysis chair.  Symptoms improved with hemodialysis.  Patient underwent R UE venogram on 8/30.  Vascular surgery following for possible RUE HD access.  Patient is now waiting placement where she can get HD on stretcher that has been difficult to find.  Assessment and Plan: ESRD on HD -Reportedly needed 4 people and [place] lift outpatient HD -Per vascular, patient postponed  aVF placement due to cold-like symptoms -Not able to find stretcher dialysis location.  Final plan on her HD unclear. TOC following -Cont with PT as tolerated   Chronic hypoxic respiratory failure: 3 L at baseline.  Now saturating at 100% on 2 L.  Reports saturation of 90. -Weaning off oxygen as tolerated -She may not need more than nocturnal oxygen.   Acute on chronic diastolic CHF/volume overload/hypertensive emergency -Nephrology managing volume with HD -Imdur and metoprolol on hold due to hypotension -Continue midodrine   Hypotension: Resolved.  -Continue midodrine -Continue holding Imdur and metoprolol.   Anemia of CKD: H&H relatively stable. -Could benefit from a unit of blood -IV iron per nephrology.   Controlled IDDM-2: A1c 5.3%.  Discontinued CBG monitoring and SSI.   Recent septic shock secondary to Bacteroides, bacteremia and  liver, and splenic abscess-completed antibiotic course.   Leukocytosis: Seems chronic but improved. -Monitor intermittently   PAD s/p bilateral AKA -Continue aspirin and statin. -Requested RN to replace mattress and bed   Hyperlipidemia -Statin.    Hypomagnesemia/hypokalemia/hyponatremia/hypophosphatemia -Per nephrology   Irritable bowel syndrome with constipation. Intractable nausea and vomiting -Continue Amitiza.   Sneezing, headache and fatigue: Likely allergy and oxygen.  COVID-19 and full RVP nonreactive.  -Continue Claritin -Wean oxygen   Morbid obesity Body mass index is 44.68 kg/m.       Subjective: Denies sob  Physical Exam: Vitals:   12/27/21 1300 12/27/21 1504 12/27/21 1513 12/27/21 1530  BP:  (!) 107/47 (!) 96/39 (!) 118/37  Pulse:  81 81 82  Resp:  20 15 18   Temp:  98.2 F (36.8 C)    TempSrc:  Oral    SpO2:  100% 100% 100%  Weight: 120.4 kg 119 kg    Height:       General exam: Awake, laying in bed, in nad Respiratory system: Normal respiratory effort, no wheezing Cardiovascular system: regular rate, s1, s2 Gastrointestinal system: Soft, nondistended, positive BS Central nervous system: CN2-12 grossly intact, strength intact Extremities: Perfused, no clubbing Skin: Normal skin turgor, no notable skin lesions seen Psychiatry: Mood normal // no visual hallucinations   Data Reviewed:  There are no new results to review at this time.  Family Communication: Pt in room, family not at bedside  Disposition: Status is: Inpatient Remains inpatient appropriate because: Severity of illness  Planned Discharge Destination:  Unclear at this time     Author: Marylu Lund, MD 12/27/2021 4:02 PM  For on  call review [email or web address].

## 2021-12-27 NOTE — Progress Notes (Signed)
Courtney Gray, [place] in department. PA informed pt sbp 90s. UF goal decreased from 2 liter to 1 liter and will keep sbp greater than or equal to 100 per PA instruction

## 2021-12-27 NOTE — Progress Notes (Signed)
Mobility Specialist Progress Note:   12/27/21 1315  Mobility  Activity Turned to right side;Turned to left side;Turned to back - supine  Level of Assistance +2 (takes two people)  Assistive Device MaxiMove  Activity Response Tolerated well  $Mobility charge 1 Mobility   RN requesting assistance to weigh pt via maximove, d/t bed not weighing correctly. Pt tolerated well. Left with all needs met.   Nelta Numbers Acute Rehab Secure Chat or Office Phone: [phone]

## 2021-12-27 NOTE — Progress Notes (Signed)
   12/27/21 1930  Vitals  Temp 98.6 F (37 C)  BP (!) 97/24  MAP (mmHg) (!) 50  Pulse Rate 72  ECG Heart Rate 72  Resp 18  Post Treatment  Dialyzer Clearance Heavily streaked  Duration of HD Treatment -hour(s) 4 hour(s)  Liters Processed 72  Fluid Removed 1001 mL  Tolerated HD Treatment Yes   TX fin. UF stopped midway through due to low bp

## 2021-12-28 DIAGNOSIS — R7881 Bacteremia: Secondary | ICD-10-CM | POA: Diagnosis not present

## 2021-12-28 DIAGNOSIS — S68113A Complete traumatic metacarpophalangeal amputation of left middle finger, initial encounter: Secondary | ICD-10-CM | POA: Diagnosis not present

## 2021-12-28 DIAGNOSIS — N186 End stage renal disease: Secondary | ICD-10-CM | POA: Diagnosis not present

## 2021-12-28 NOTE — Plan of Care (Signed)
  Problem: Skin Integrity: Goal: Risk for impaired skin integrity will decrease Outcome: Progressing   Problem: Education: Goal: Knowledge of General Education information will improve Description: Including pain rating scale, medication(s)/side effects and non-pharmacologic comfort measures Outcome: Progressing   Problem: Health Behavior/Discharge Planning: Goal: Ability to manage health-related needs will improve Outcome: Progressing   Problem: Clinical Measurements: Goal: Ability to maintain clinical measurements within normal limits will improve Outcome: Progressing Goal: Will remain free from infection Outcome: Progressing Goal: Diagnostic test results will improve Outcome: Progressing Goal: Respiratory complications will improve Outcome: Progressing Goal: Cardiovascular complication will be avoided Outcome: Progressing   Problem: Activity: Goal: Risk for activity intolerance will decrease Outcome: Progressing   Problem: Nutrition: Goal: Adequate nutrition will be maintained Outcome: Progressing   Problem: Coping: Goal: Level of anxiety will decrease Outcome: Progressing   Problem: Elimination: Goal: Will not experience complications related to bowel motility Outcome: Progressing Goal: Will not experience complications related to urinary retention Outcome: Progressing   Problem: Pain Managment: Goal: General experience of comfort will improve Outcome: Progressing   Problem: Safety: Goal: Ability to remain free from injury will improve Outcome: Progressing   Problem: Skin Integrity: Goal: Risk for impaired skin integrity will decrease Outcome: Progressing

## 2021-12-28 NOTE — Progress Notes (Signed)
[facility] Progress Note   Subjective:   Seen in room, no new concerns today. Discussed working towards goal of healing wounds so she can tolerate sitting in a chair for 4 hours for dialysis. She did have mild hypotension with HD yesterday but was asymptomatic. No SOB, CP or dizziness today.   Objective Vitals:   12/27/21 2026 12/27/21 2254 12/28/21 0536 12/28/21 1003  BP: (!) 92/53 (!) 109/49 100/65 (!) 111/38  Pulse: 73 81 77 (!) 57  Resp: 18   19  Temp: 98.6 F (37 C)  98.7 F (37.1 C) 98.7 F (37.1 C)  TempSrc:   Oral Oral  SpO2: 100% 98% 100% 99%  Weight:      Height:       Physical Exam General: Alert female on NAD Heart: RRR, no murmurs, rubs or gallops Lungs: CTA bilaterally, no increased WOB Abdomen: Soft, non-distended, +BS Extremities: B/l AKA, no stump edema Dialysis Access:  L IJ [facility]  Additional Objective Labs: Basic Metabolic Panel: Recent Labs  Lab 12/23/21 1331 12/25/21 1610 12/27/21 1523  NA 135 135 133*  K 3.4* 4.2 4.3  CL 97* 95* 94*  CO2 28 26 24   GLUCOSE 135* 180* 180*  BUN 28* 40* 38*  CREATININE 5.77* 7.89* 7.73*  CALCIUM 8.7* 9.6 9.6  PHOS 4.6 7.5* 7.1*   Liver Function Tests: Recent Labs  Lab 12/23/21 1331 12/25/21 1610 12/27/21 1523  ALBUMIN 2.6* 2.5* 2.6*   No results for input(s): "LIPASE", "AMYLASE" in the last 168 hours. CBC: Recent Labs  Lab 12/23/21 1331 12/25/21 1610 12/27/21 1523  WBC 11.2* 11.0* 12.7*  HGB 7.6* 7.3* 7.3*  HCT 25.6* 24.7* 24.8*  MCV 100.0 100.4* 102.1*  PLT 316 305 303   Blood Culture    Component Value Date/Time   SDES BLOOD LEFT ANTECUBITAL 10/16/2021 1534   SPECREQUEST  10/16/2021 1534    BOTTLES DRAWN AEROBIC AND ANAEROBIC Blood Culture results may not be optimal due to an inadequate volume of blood received in culture bottles   CULT  10/16/2021 1534    NO GROWTH 5 DAYS Performed at [facility] [address] [place], [place] [address]    REPTSTATUS 10/21/2021  FINAL 10/16/2021 1534    Cardiac Enzymes: No results for input(s): "CKTOTAL", "CKMB", "CKMBINDEX", "TROPONINI" in the last 168 hours. CBG: No results for input(s): "GLUCAP" in the last 168 hours. Iron Studies: No results for input(s): "IRON", "TIBC", "TRANSFERRIN", "FERRITIN" in the last 72 hours. @lablastinr3 @ Studies/Results: No results found. Medications:  ferric gluconate (FERRLECIT) IVPB 125 mg (12/27/21 1823)    aspirin EC  81 mg Oral Daily   atorvastatin  10 mg Oral Daily   darbepoetin (ARANESP) injection - DIALYSIS  200 mcg Intravenous Q Mon-HD   docusate sodium  100 mg Oral BID   feeding supplement  1 Container Oral TID BM   gabapentin  100 mg Oral Q1200   gabapentin  200 mg Oral QHS   heparin  5,000 Units Subcutaneous Q8H   levothyroxine  100 mcg Oral Q0600   loratadine  10 mg Oral Daily   lubiprostone  8 mcg Oral Q Tue   And   lubiprostone  8 mcg Oral Q Sat   midodrine  10 mg Oral Q M,W,F-HD   pantoprazole  40 mg Oral Daily   sevelamer carbonate  2.4 g Oral TID WC    Outpatient Dialysis Orders: MWF - [place]  4hrs, BFR 400, DFR 600,  AVF, EDW  146kg, 3K/ 2Ca - last hep B labs 8/21 - Heparin 2500 units IV with HD - Hectorol 39mcg IV q HD, held as of 10/30/21  Assessment/Plan: ESRD:MWF HD today on schedule. Next HD 12/27/2021- requested afternoon HD [facility] malfunction: Had required two [facility] exchanges and having issues again on 9/4, got overnight tPA dwell. IR thinks TDC is positional as no fibrin on tip at last exchange. Discussed the need for a long term solution, VVS consulted - plan was for RUE AVF 12/19/21 but was postponed. Heparin boluses restarted with dialysis  BP/volume: BP variable, was on low dose metoprolol and midodine -> metop discontinued on 9/1. Last hoyer weight was 118kg which is down significantly from OP EDW -> lower on discharge. BP dropping on HD but she does not seem to have any excess fluid, will lower UF goals Chronic respiratory  failure with hypoxia on cannula oxygen at home: on 5L O2 at baseline, have been able to titrate down O2 to 2-3L Anemia of ESRD: Hgb 7.3 - continue high dose ESA q Monday while here. Tsat was 16 on 9/14 with ferritin 1287, will start short course of IV Fe. Transfuse < 7. Secondary hyperparathyroidism: CorrCa high, VDRA on hold. Renvela increased - Phos elevated but improving.  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 Hypokalemia: Recurrent issue recently and dislikes Klor-Con tabs. Using 3K bath with HD Severe sacral decubitus  wound: Per primary/wound care team. Disposition: Difficult situation. Prior to admit, patient required 6 staff members and 2 hoyer pads for transfer into HD chair as OP. Could not stay on dialysis for full treatment because of pain of multiple decubitus ulcers and PTAR unable to lift pt into ambulance for transport. SW has been unable to locate stretcher dialysis location so far. Her weight is much lower from admit, asked PT about trying to hoyer to recliner for HD -> apparently would need hoyer that wraps around her legs and patient is only able to tolerate <1 hour sitting up at this point. Please see renal navigator note from 12/23/21  Anice Paganini, PA-C 12/28/2021, 10:05 AM  [facility] Pager: [phone]

## 2021-12-28 NOTE — Progress Notes (Signed)
Progress Note   Patient: Courtney Gray VVO:[id number] DOB: 1970/05/05 DOA: 11/01/2021     57 DOS: the patient was seen and examined on 12/28/2021   Brief hospital course: 51 year old F with PMH of ESRD on HD TTS, recent hospitalization for bacteremia, hepatic and splenic abscess on long-term IV antibiotics, diastolic CHF, PVD s/p bilateral AKA, IDDM-2, morbid obesity and chronic sacral wound presenting with progressive shortness of breath in the setting of missed dialysis and admitted for hypertensive emergency.  Per patient, she never missed her dialysis however according to outpatient HD records, it takes at least 4 staff members and [facility] lift for patient to be seated in the dialysis chair.  Symptoms improved with hemodialysis.  Patient underwent R UE venogram on 8/30.  Vascular surgery following for possible RUE HD access.  Patient is now waiting placement where she can get HD on stretcher that has been difficult to find.  Assessment and Plan: ESRD on HD -Reportedly needed 4 people and [place] lift outpatient HD -Per vascular, patient postponed  aVF placement due to cold-like symptoms -Not able to find stretcher dialysis location.  Final plan on her HD unclear. TOC continues to follow -Cont with PT as tolerated   Chronic hypoxic respiratory failure: 3 L at baseline.  Now saturating at 100% on 2 L.  Reports saturation of 90. -Weaning off oxygen as tolerated -She may not need more than nocturnal oxygen.   Acute on chronic diastolic CHF/volume overload/hypertensive emergency -Nephrology managing volume with HD -Imdur and metoprolol on hold due to hypotension -Continue midodrine   Hypotension: Resolved.  -Continue midodrine -Continue holding Imdur and metoprolol.   Anemia of CKD: H&H relatively stable. -Could benefit from a unit of blood -IV iron per nephrology.   Controlled IDDM-2: A1c 5.3%.  Discontinued CBG monitoring and SSI.   Recent septic shock secondary to Bacteroides,  bacteremia and liver, and splenic abscess-completed antibiotic course.   Leukocytosis: Seems chronic but improved. -Monitor intermittently   PAD s/p bilateral AKA -Continue aspirin and statin. -Requested RN to replace mattress and bed   Hyperlipidemia -Statin.    Hypomagnesemia/hypokalemia/hyponatremia/hypophosphatemia -Per nephrology   Irritable bowel syndrome with constipation. Intractable nausea and vomiting -Continue Amitiza.   Sneezing, headache and fatigue: Likely allergy and oxygen.  COVID-19 and full RVP nonreactive.  -Continue Claritin -Wean oxygen   Morbid obesity Body mass index is 44.68 kg/m.       Subjective: Without major issues  Physical Exam: Vitals:   12/27/21 2026 12/27/21 2254 12/28/21 0536 12/28/21 1003  BP: (!) 92/53 (!) 109/49 100/65 (!) 111/38  Pulse: 73 81 77 (!) 57  Resp: 18   19  Temp: 98.6 F (37 C)  98.7 F (37.1 C) 98.7 F (37.1 C)  TempSrc:   Oral Oral  SpO2: 100% 98% 100% 99%  Weight:      Height:       General exam: Conversant, in no acute distress Respiratory system: normal chest rise, clear, no audible wheezing Cardiovascular system: regular rhythm, s1-s2 Gastrointestinal system: Nondistended, nontender, pos BS Central nervous system: No seizures, no tremors Extremities: No cyanosis, no joint deformities Skin: No rashes, no pallor Psychiatry: Affect normal // no auditory hallucinations   Data Reviewed:  There are no new results to review at this time.  Family Communication: Pt in room, family not at bedside  Disposition: Status is: Inpatient Remains inpatient appropriate because: Severity of illness  Planned Discharge Destination:  Unclear at this time     Author: Marylu Lund, MD 12/28/2021  2:23 PM  For on call review [email or web address].

## 2021-12-29 DIAGNOSIS — S68113A Complete traumatic metacarpophalangeal amputation of left middle finger, initial encounter: Secondary | ICD-10-CM | POA: Diagnosis not present

## 2021-12-29 DIAGNOSIS — N186 End stage renal disease: Secondary | ICD-10-CM | POA: Diagnosis not present

## 2021-12-29 DIAGNOSIS — R7881 Bacteremia: Secondary | ICD-10-CM | POA: Diagnosis not present

## 2021-12-29 NOTE — Progress Notes (Addendum)
Dr. Wyline Copas gave order to give dose of simethicone early now. MD at bedside.

## 2021-12-29 NOTE — Progress Notes (Signed)
Progress Note   Patient: Courtney Gray ZOX:[id number] DOB: 1970-12-18 DOA: 11/01/2021     58 DOS: the patient was seen and examined on 12/29/2021   Brief hospital course: 51 year old F with PMH of ESRD on HD TTS, recent hospitalization for bacteremia, hepatic and splenic abscess on long-term IV antibiotics, diastolic CHF, PVD s/p bilateral AKA, IDDM-2, morbid obesity and chronic sacral wound presenting with progressive shortness of breath in the setting of missed dialysis and admitted for hypertensive emergency.  Per patient, she never missed her dialysis however according to outpatient HD records, it takes at least 4 staff members and [facility] lift for patient to be seated in the dialysis chair.  Symptoms improved with hemodialysis.  Patient underwent R UE venogram on 8/30.  Vascular surgery following for possible RUE HD access.  Patient is now waiting placement where she can get HD on stretcher that has been difficult to find.  Assessment and Plan: ESRD on HD -Reportedly needed 4 people and [place] lift outpatient HD -Per vascular, patient postponed  aVF placement due to cold-like symptoms -Not able to find stretcher dialysis location.  Final plan on her HD unclear. TOC continues to follow -Cont with PT as tolerated   Chronic hypoxic respiratory failure: 3 L at baseline.  Now saturating at 100% on 2 L.  Reports saturation of 90. -Weaning off oxygen as tolerated -She may not need more than nocturnal oxygen.   Acute on chronic diastolic CHF/volume overload/hypertensive emergency -Nephrology managing volume with HD -Imdur and metoprolol on hold due to hypotension -Continue midodrine   Hypotension: Resolved.  -Continue midodrine -Continue holding Imdur and metoprolol.   Anemia of CKD: H&H relatively stable. -Could benefit from a unit of blood -IV iron per nephrology.   Controlled IDDM-2: A1c 5.3%.  Discontinued CBG monitoring and SSI.   Recent septic shock secondary to Bacteroides,  bacteremia and liver, and splenic abscess-completed antibiotic course.   Leukocytosis: Seems chronic but improved. -Monitor intermittently   PAD s/p bilateral AKA -Continue aspirin and statin. -Requested RN to replace mattress and bed   Hyperlipidemia -Statin.    Hypomagnesemia/hypokalemia/hyponatremia/hypophosphatemia -Per nephrology   Irritable bowel syndrome with constipation. Intractable nausea and vomiting -Continue Amitiza.   Sneezing, headache and fatigue: Likely allergy and oxygen.  COVID-19 and full RVP nonreactive.  -Continue Claritin -Wean oxygen   Morbid obesity Body mass index is 44.68 kg/m.       Subjective: Complaining of increased gas this AM. Passing BM and gas  Physical Exam: Vitals:   12/28/21 1809 12/28/21 2155 12/29/21 0623 12/29/21 1005  BP: (!) 147/43 (!) 145/63 (!) 122/52 (!) 146/65  Pulse: 77 74 73 72  Resp: 16 18 18 18   Temp: 98.3 F (36.8 C) 98.9 F (37.2 C) 98.5 F (36.9 C) 98.4 F (36.9 C)  TempSrc: Oral Oral Oral Oral  SpO2: 100% 100% 100% 100%  Weight:      Height:       General exam: Awake, laying in bed, in nad Respiratory system: Normal respiratory effort, no wheezing Cardiovascular system: regular rate, s1, s2 Gastrointestinal system: Soft, nondistended Central nervous system: CN2-12 grossly intact, strength intact Extremities: no clubbing, BLE amputation Skin: Normal skin turgor, no notable skin lesions seen Psychiatry: Mood normal // no visual hallucinations   Data Reviewed:  There are no new results to review at this time.  Family Communication: Pt in room, family not at bedside  Disposition: Status is: Inpatient Remains inpatient appropriate because: Severity of illness  Planned Discharge Destination:  Unclear  at this time     Author: Marylu Lund, MD 12/29/2021 3:23 PM  For on call review [email or web address].

## 2021-12-29 NOTE — Progress Notes (Signed)
[facility] Progress Note   Subjective:   Reports nasal congestion is improving. Denies SOB, CP, dizziness and nausea.   Objective Vitals:   12/28/21 1003 12/28/21 1809 12/28/21 2155 12/29/21 0623  BP: (!) 111/38 (!) 147/43 (!) 145/63 (!) 122/52  Pulse: (!) 57 77 74 73  Resp: 19 16 18 18   Temp: 98.7 F (37.1 C) 98.3 F (36.8 C) 98.9 F (37.2 C) 98.5 F (36.9 C)  TempSrc: Oral Oral Oral Oral  SpO2: 99% 100% 100% 100%  Weight:      Height:       Physical Exam General: Alert female in NAD Heart: RRR, no murmurs, rubs or gallops Lungs: CTA bilaterally without wheezing, rhonchi or rales Abdomen: Soft, non-distended, +BS Extremities: B/l BKA, no edema Dialysis Access:  L IJ [facility]  Additional Objective Labs: Basic Metabolic Panel: Recent Labs  Lab 12/23/21 1331 12/25/21 1610 12/27/21 1523  NA 135 135 133*  K 3.4* 4.2 4.3  CL 97* 95* 94*  CO2 28 26 24   GLUCOSE 135* 180* 180*  BUN 28* 40* 38*  CREATININE 5.77* 7.89* 7.73*  CALCIUM 8.7* 9.6 9.6  PHOS 4.6 7.5* 7.1*   Liver Function Tests: Recent Labs  Lab 12/23/21 1331 12/25/21 1610 12/27/21 1523  ALBUMIN 2.6* 2.5* 2.6*   No results for input(s): "LIPASE", "AMYLASE" in the last 168 hours. CBC: Recent Labs  Lab 12/23/21 1331 12/25/21 1610 12/27/21 1523  WBC 11.2* 11.0* 12.7*  HGB 7.6* 7.3* 7.3*  HCT 25.6* 24.7* 24.8*  MCV 100.0 100.4* 102.1*  PLT 316 305 303   Blood Culture    Component Value Date/Time   SDES BLOOD LEFT ANTECUBITAL 10/16/2021 1534   SPECREQUEST  10/16/2021 1534    BOTTLES DRAWN AEROBIC AND ANAEROBIC Blood Culture results may not be optimal due to an inadequate volume of blood received in culture bottles   CULT  10/16/2021 1534    NO GROWTH 5 DAYS Performed at [facility] [address] [place], [place] [address]    REPTSTATUS 10/21/2021 FINAL 10/16/2021 1534    Cardiac Enzymes: No results for input(s): "CKTOTAL", "CKMB", "CKMBINDEX", "TROPONINI" in the last  168 hours. CBG: No results for input(s): "GLUCAP" in the last 168 hours. Iron Studies: No results for input(s): "IRON", "TIBC", "TRANSFERRIN", "FERRITIN" in the last 72 hours. @lablastinr3 @ Studies/Results: No results found. Medications:  ferric gluconate (FERRLECIT) IVPB 125 mg (12/27/21 1823)    aspirin EC  81 mg Oral Daily   atorvastatin  10 mg Oral Daily   darbepoetin (ARANESP) injection - DIALYSIS  200 mcg Intravenous Q Mon-HD   docusate sodium  100 mg Oral BID   feeding supplement  1 Container Oral TID BM   gabapentin  100 mg Oral Q1200   gabapentin  200 mg Oral QHS   heparin  5,000 Units Subcutaneous Q8H   levothyroxine  100 mcg Oral Q0600   loratadine  10 mg Oral Daily   lubiprostone  8 mcg Oral Q Tue   And   lubiprostone  8 mcg Oral Q Sat   midodrine  10 mg Oral Q M,W,F-HD   pantoprazole  40 mg Oral Daily   sevelamer carbonate  2.4 g Oral TID WC    Outpatient Dialysis Orders: MWF - [place]  4hrs, BFR 400, DFR 600,  AVF, EDW 146kg, 3K/ 2Ca - last hep B labs 8/21 - Heparin 2500 units IV with HD - Hectorol 12mcg IV q HD, held as of 10/30/21  Assessment/Plan:  ESRD:MWF HD today on schedule. Next HD 12/29/2021- requested afternoon HD [facility] malfunction: Had required two [facility] exchanges and having issues again on 9/4, got overnight tPA dwell. IR thinks TDC is positional as no fibrin on tip at last exchange. Discussed the need for a long term solution, VVS consulted - plan was for RUE AVF 12/19/21 but was postponed. Heparin boluses restarted with dialysis  BP/volume: BP variable, was on low dose metoprolol and midodine -> metop discontinued on 9/1. Last hoyer weight was 118kg which is down significantly from OP EDW -> lower on discharge. BP dropping on HD but she does not seem to have any excess fluid, will lower UF goals Chronic respiratory failure with hypoxia on cannula oxygen at home: on 5L O2 at baseline, have been able to titrate down O2 to 2-3L Anemia of ESRD:  Hgb 7.3 - continue high dose ESA q Monday while here. Tsat was 16 on 9/14 with ferritin 1287, will start short course of IV Fe. Transfuse < 7. Secondary hyperparathyroidism: CorrCa high, VDRA on hold. Renvela increased - Phos elevated but improving.  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 Hypokalemia: Recurrent issue recently and dislikes Klor-Con tabs. Using 3K bath with HD Severe sacral decubitus  wound: Per primary/wound care team. Disposition: Difficult situation. Prior to admit, patient required 6 staff members and 2 hoyer pads for transfer into HD chair as OP. Could not stay on dialysis for full treatment because of pain of multiple decubitus ulcers and PTAR unable to lift pt into ambulance for transport. SW has been unable to locate stretcher dialysis location so far. Her weight is much lower from admit, asked PT about trying to hoyer to recliner for HD -> apparently would need hoyer that wraps around her legs and patient is only able to tolerate <1 hour sitting up at this point. Please see renal navigator note from 12/23/21    Anice Paganini, PA-C 12/29/2021, 9:28 AM  [facility] Pager: [phone]

## 2021-12-30 DIAGNOSIS — N186 End stage renal disease: Secondary | ICD-10-CM | POA: Diagnosis not present

## 2021-12-30 DIAGNOSIS — R7881 Bacteremia: Secondary | ICD-10-CM | POA: Diagnosis not present

## 2021-12-30 DIAGNOSIS — S68113A Complete traumatic metacarpophalangeal amputation of left middle finger, initial encounter: Secondary | ICD-10-CM | POA: Diagnosis not present

## 2021-12-30 MED ORDER — HEPARIN SODIUM (PORCINE) 1000 UNIT/ML DIALYSIS
2000.0000 [IU] | INTRAMUSCULAR | Status: DC | PRN
  Administered 2021-12-31: 2000 [IU] via INTRAVENOUS_CENTRAL
  Filled 2021-12-30 (×2): qty 2

## 2021-12-30 NOTE — Progress Notes (Addendum)
[facility] Progress Note   Subjective: Seen in room getting wound care. Extensive wounds on buttocks covered with foam drsgs. HD later today.  Objective Vitals:   12/29/21 1657 12/29/21 2136 12/30/21 0631 12/30/21 0957  BP: (!) 140/43 (!) 123/53 118/60 (!) 107/49  Pulse: 78 82 78 79  Resp: 16 18 18 19   Temp: 98.7 F (37.1 C) 99 F (37.2 C) 98.4 F (36.9 C) 98 F (36.7 C)  TempSrc: Oral Oral  Oral  SpO2: 99% 100% 98% 99%  Weight:      Height:       Physical Exam General: Chronically ill appearing morbidly obese female in NAD Skin: Sacral wounds present-can't see all of them. Foam dressings intact.  Heart: S1,S2, RRR No M/R/G Lungs: CTAB, no WOB Abdomen: Obese, NABS Extremities: Bilateral AKA no stump edema Dialysis Access: [facility] drsg intact   Additional Objective Labs: Basic Metabolic Panel: Recent Labs  Lab 12/23/21 1331 12/25/21 1610 12/27/21 1523  NA 135 135 133*  K 3.4* 4.2 4.3  CL 97* 95* 94*  CO2 28 26 24   GLUCOSE 135* 180* 180*  BUN 28* 40* 38*  CREATININE 5.77* 7.89* 7.73*  CALCIUM 8.7* 9.6 9.6  PHOS 4.6 7.5* 7.1*   Liver Function Tests: Recent Labs  Lab 12/23/21 1331 12/25/21 1610 12/27/21 1523  ALBUMIN 2.6* 2.5* 2.6*   No results for input(s): "LIPASE", "AMYLASE" in the last 168 hours. CBC: Recent Labs  Lab 12/23/21 1331 12/25/21 1610 12/27/21 1523  WBC 11.2* 11.0* 12.7*  HGB 7.6* 7.3* 7.3*  HCT 25.6* 24.7* 24.8*  MCV 100.0 100.4* 102.1*  PLT 316 305 303   Blood Culture    Component Value Date/Time   SDES BLOOD LEFT ANTECUBITAL 10/16/2021 1534   SPECREQUEST  10/16/2021 1534    BOTTLES DRAWN AEROBIC AND ANAEROBIC Blood Culture results may not be optimal due to an inadequate volume of blood received in culture bottles   CULT  10/16/2021 1534    NO GROWTH 5 DAYS Performed at [facility] [address], [place], [place] [address]    REPTSTATUS 10/21/2021 FINAL 10/16/2021 1534    Cardiac Enzymes: No  results for input(s): "CKTOTAL", "CKMB", "CKMBINDEX", "TROPONINI" in the last 168 hours. CBG: No results for input(s): "GLUCAP" in the last 168 hours. Iron Studies: No results for input(s): "IRON", "TIBC", "TRANSFERRIN", "FERRITIN" in the last 72 hours. @lablastinr3 @ Studies/Results: No results found. Medications:  ferric gluconate (FERRLECIT) IVPB 125 mg (12/27/21 1823)    aspirin EC  81 mg Oral Daily   atorvastatin  10 mg Oral Daily   darbepoetin (ARANESP) injection - DIALYSIS  200 mcg Intravenous Q Mon-HD   docusate sodium  100 mg Oral BID   feeding supplement  1 Container Oral TID BM   gabapentin  100 mg Oral Q1200   gabapentin  200 mg Oral QHS   heparin  5,000 Units Subcutaneous Q8H   levothyroxine  100 mcg Oral Q0600   loratadine  10 mg Oral Daily   lubiprostone  8 mcg Oral Q Tue   And   lubiprostone  8 mcg Oral Q Sat   midodrine  10 mg Oral Q M,W,F-HD   pantoprazole  40 mg Oral Daily   sevelamer carbonate  2.4 g Oral TID WC     Outpatient Dialysis Orders: MWF - [place]  4hrs, BFR 400, DFR 600,  AVF, EDW 146kg, 3K/ 2Ca - last hep B labs 8/21 - Heparin 2500 units IV with  HD - Hectorol 63mcg IV q HD, held as of 10/30/21   Assessment/Plan: ESRD:MWF HD today on schedule. Next HD 12/29/2021- requested afternoon HD [facility] malfunction: Had required two [facility] exchanges and having issues again on 9/4, got overnight tPA dwell. IR thinks TDC is positional as no fibrin on tip at last exchange. Discussed the need for a long term solution, VVS consulted - plan was for RUE AVF 12/19/21 but was postponed. Heparin boluses restarted with dialysis  BP/volume: BP variable, was on low dose metoprolol and midodine -> metop discontinued on 9/1. Last hoyer weight was 118kg which is down significantly from OP EDW -> lower on discharge. BP dropping on HD but she does not seem to have any excess fluid, will lower UF goals Chronic respiratory failure with hypoxia on cannula oxygen at home:  on 5L O2 at baseline, have been able to titrate down O2 to 2-3L Anemia of ESRD: Hgb 7.3 - continue high dose ESA q Monday while here. Tsat was 16 on 9/14 with ferritin 1287, will start short course of IV Fe. Transfuse < 7. Secondary hyperparathyroidism: CorrCa high, VDRA on hold. Renvela increased - Phos elevated but improving.  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 Hypokalemia: Recurrent issue recently and dislikes Klor-Con tabs. Using 3K bath with HD Severe sacral decubitus  wound: Per primary/wound care team. Disposition: Difficult situation. Prior to admit, patient required 6 staff members and 2 hoyer pads for transfer into HD chair as OP. Could not stay on dialysis for full treatment because of pain of multiple decubitus ulcers and PTAR unable to lift pt into ambulance for transport. SW has been unable to locate stretcher dialysis location so far. Her weight is much lower from admit, asked PT about trying to hoyer to recliner for HD -> apparently would need hoyer that wraps around her legs and patient is only able to tolerate <1 hour sitting up at this point. Please see renal navigator note from 12/23/21    Jimmye Norman. Reona Zendejas NP-C 12/30/2021, 12:03 PM  [employer] [phone]

## 2021-12-30 NOTE — Progress Notes (Signed)
Progress Note   Patient: Courtney Gray CBU:[id number] DOB: 05/12/1970 DOA: 11/01/2021     51 DOS: the patient was seen and examined on 12/30/2021   Brief hospital course: 51 year old F with PMH of ESRD on HD TTS, recent hospitalization for bacteremia, hepatic and splenic abscess on long-term IV antibiotics, diastolic CHF, PVD s/p bilateral AKA, IDDM-2, morbid obesity and chronic sacral wound presenting with progressive shortness of breath in the setting of missed dialysis and admitted for hypertensive emergency.  Per patient, she never missed her dialysis however according to outpatient HD records, it takes at least 4 staff members and [facility] lift for patient to be seated in the dialysis chair.  Symptoms improved with hemodialysis.  Patient underwent R UE venogram on 8/30.  Vascular surgery following for possible RUE HD access.  Patient is now waiting placement where she can get HD on stretcher that has been difficult to find.  Assessment and Plan: ESRD on HD -Reportedly needed 4 people and [place] lift outpatient HD -Per vascular, patient postponed  aVF placement due to cold-like symptoms -Not able to find stretcher dialysis location.  Final plan on her HD unclear. TOC is following -Cont with PT as tolerated   Chronic hypoxic respiratory failure: 3 L at baseline.  Now saturating at 100% on 2 L.  Reports saturation of 90. -Weaning off oxygen as tolerated -She may not need more than nocturnal oxygen.   Acute on chronic diastolic CHF/volume overload/hypertensive emergency -Nephrology managing volume with HD -Imdur and metoprolol on hold due to hypotension -Continue midodrine   Hypotension: Resolved.  -Continue midodrine -Continue holding Imdur and metoprolol.   Anemia of CKD: H&H relatively stable. -Could benefit from a unit of blood -IV iron per nephrology.   Controlled IDDM-2: A1c 5.3%.  Discontinued CBG monitoring and SSI.   Recent septic shock secondary to Bacteroides, bacteremia and  liver, and splenic abscess-completed antibiotic course.   Leukocytosis: Seems chronic but improved. -Monitor intermittently   PAD s/p bilateral AKA -Continue aspirin and statin. -Requested RN to replace mattress and bed   Hyperlipidemia -Statin.    Hypomagnesemia/hypokalemia/hyponatremia/hypophosphatemia -Per nephrology   Irritable bowel syndrome with constipation. Intractable nausea and vomiting -Continue Amitiza.   Sneezing, headache and fatigue: Likely allergy and oxygen.  COVID-19 and full RVP nonreactive.  -Continue Claritin -Wean oxygen   Morbid obesity Body mass index is 44.68 kg/m.       Subjective: Without complaints today  Physical Exam: Vitals:   12/29/21 1657 12/29/21 2136 12/30/21 0631 12/30/21 0957  BP: (!) 140/43 (!) 123/53 118/60 (!) 107/49  Pulse: 78 82 78 79  Resp: 16 18 18 19   Temp: 98.7 F (37.1 C) 99 F (37.2 C) 98.4 F (36.9 C) 98 F (36.7 C)  TempSrc: Oral Oral  Oral  SpO2: 99% 100% 98% 99%  Weight:      Height:       General exam: Conversant, in no acute distress Respiratory system: normal chest rise, clear, no audible wheezing Cardiovascular system: regular rhythm, s1-s2 Gastrointestinal system: Nondistended, nontender, pos BS Central nervous system: No seizures, no tremors Extremities: No cyanosis, s/p BLE amputations Skin: No rashes, no pallor Psychiatry: Affect normal // no auditory hallucinations   Data Reviewed:  There are no new results to review at this time.  Family Communication: Pt in room, family not at bedside  Disposition: Status is: Inpatient Remains inpatient appropriate because: Severity of illness  Planned Discharge Destination:  Unclear at this time     Author: Marylu Lund, MD  12/30/2021 1:18 PM  For on call review [email or web address].

## 2021-12-30 NOTE — Progress Notes (Signed)
Report called for patient to come to HD and transport organized. Nurse notified our unit that the patient refused to come at this time because she wanted to eat lunch before coming to treatment.

## 2021-12-31 DIAGNOSIS — I5033 Acute on chronic diastolic (congestive) heart failure: Secondary | ICD-10-CM | POA: Diagnosis not present

## 2021-12-31 DIAGNOSIS — S68113A Complete traumatic metacarpophalangeal amputation of left middle finger, initial encounter: Secondary | ICD-10-CM | POA: Diagnosis not present

## 2021-12-31 DIAGNOSIS — R7881 Bacteremia: Secondary | ICD-10-CM | POA: Diagnosis not present

## 2021-12-31 DIAGNOSIS — N186 End stage renal disease: Secondary | ICD-10-CM | POA: Diagnosis not present

## 2021-12-31 LAB — CBC
HCT: 24 % — ABNORMAL LOW (ref 36.0–46.0)
Hemoglobin: 7.5 g/dL — ABNORMAL LOW (ref 12.0–15.0)
MCH: 30.9 pg (ref 26.0–34.0)
MCHC: 31.3 g/dL (ref 30.0–36.0)
MCV: 98.8 fL (ref 80.0–100.0)
Platelets: 288 10*3/uL (ref 150–400)
RBC: 2.43 MIL/uL — ABNORMAL LOW (ref 3.87–5.11)
RDW: 15.1 % (ref 11.5–15.5)
WBC: 10.3 10*3/uL (ref 4.0–10.5)
nRBC: 0 % (ref 0.0–0.2)

## 2021-12-31 LAB — RENAL FUNCTION PANEL
Albumin: 2.5 g/dL — ABNORMAL LOW (ref 3.5–5.0)
Anion gap: 14 (ref 5–15)
BUN: 45 mg/dL — ABNORMAL HIGH (ref 6–20)
CO2: 24 mmol/L (ref 22–32)
Calcium: 9.7 mg/dL (ref 8.9–10.3)
Chloride: 98 mmol/L (ref 98–111)
Creatinine, Ser: 10.09 mg/dL — ABNORMAL HIGH (ref 0.44–1.00)
GFR, Estimated: 4 mL/min — ABNORMAL LOW (ref >60)
Glucose, Bld: 85 mg/dL (ref 70–99)
Phosphorus: 7.8 mg/dL — ABNORMAL HIGH (ref 2.5–4.6)
Potassium: 5.7 mmol/L — ABNORMAL HIGH (ref 3.5–5.1)
Sodium: 136 mmol/L (ref 135–145)

## 2021-12-31 MED ORDER — DARBEPOETIN ALFA 200 MCG/0.4ML IJ SOSY: 200.0000 ug | PREFILLED_SYRINGE | Freq: Once | INTRAMUSCULAR | Status: DC

## 2021-12-31 MED ORDER — HEPARIN SODIUM (PORCINE) 1000 UNIT/ML IJ SOLN
INTRAMUSCULAR | Status: AC
  Filled 2021-12-31: qty 4

## 2021-12-31 NOTE — Progress Notes (Signed)
[facility] Progress Note   Subjective: Declined HD yesterday-- now for today.  No issues.     Objective Vitals:   12/31/21 0850 12/31/21 0903 12/31/21 0930 12/31/21 1000  BP: (!) 116/43 (!) 116/43 (!) 114/31 (!) 123/50  Pulse: 72 73 76 69  Resp: 13 (!) 21 18 (!) 22  Temp:      TempSrc:      SpO2: 100% 100% 100% 98%  Weight:      Height:       Physical Exam General: Chronically ill appearing, NAD Skin: Sacral wounds present Heart: S1,S2, RRR No M/R/G Lungs: CTAB, no WOB Abdomen: Obese, NABS Extremities: Bilateral AKA no stump edema Dialysis Access: LIJ TDC drsg intact   Additional Objective Labs: Basic Metabolic Panel: Recent Labs  Lab 12/25/21 1610 12/27/21 1523 12/31/21 0851  NA 135 133* 136  K 4.2 4.3 5.7*  CL 95* 94* 98  CO2 26 24 24   GLUCOSE 180* 180* 85  BUN 40* 38* 45*  CREATININE 7.89* 7.73* 10.09*  CALCIUM 9.6 9.6 9.7  PHOS 7.5* 7.1* 7.8*   Liver Function Tests: Recent Labs  Lab 12/25/21 1610 12/27/21 1523 12/31/21 0851  ALBUMIN 2.5* 2.6* 2.5*   No results for input(s): "LIPASE", "AMYLASE" in the last 168 hours. CBC: Recent Labs  Lab 12/25/21 1610 12/27/21 1523 12/31/21 0851  WBC 11.0* 12.7* 10.3  HGB 7.3* 7.3* 7.5*  HCT 24.7* 24.8* 24.0*  MCV 100.4* 102.1* 98.8  PLT 305 303 288   Blood Culture    Component Value Date/Time   SDES BLOOD LEFT ANTECUBITAL 10/16/2021 1534   SPECREQUEST  10/16/2021 1534    BOTTLES DRAWN AEROBIC AND ANAEROBIC Blood Culture results may not be optimal due to an inadequate volume of blood received in culture bottles   CULT  10/16/2021 1534    NO GROWTH 5 DAYS Performed at [facility] [address] [place], [place] [address]    REPTSTATUS 10/21/2021 FINAL 10/16/2021 1534    Cardiac Enzymes: No results for input(s): "CKTOTAL", "CKMB", "CKMBINDEX", "TROPONINI" in the last 168 hours. CBG: No results for input(s): "GLUCAP" in the last 168 hours. Iron Studies: No results for  input(s): "IRON", "TIBC", "TRANSFERRIN", "FERRITIN" in the last 72 hours. @lablastinr3 @ Studies/Results: No results found. Medications:  ferric gluconate (FERRLECIT) IVPB 110 mL/hr at 12/31/21 0900    aspirin EC  81 mg Oral Daily   atorvastatin  10 mg Oral Daily   darbepoetin (ARANESP) injection - DIALYSIS  200 mcg Intravenous Q Mon-HD   darbepoetin (ARANESP) injection - DIALYSIS  200 mcg Intravenous Once   docusate sodium  100 mg Oral BID   feeding supplement  1 Container Oral TID BM   gabapentin  100 mg Oral Q1200   gabapentin  200 mg Oral QHS   heparin  5,000 Units Subcutaneous Q8H   levothyroxine  100 mcg Oral Q0600   loratadine  10 mg Oral Daily   lubiprostone  8 mcg Oral Q Tue   And   lubiprostone  8 mcg Oral Q Sat   midodrine  10 mg Oral Q M,W,F-HD   pantoprazole  40 mg Oral Daily   sevelamer carbonate  2.4 g Oral TID WC     Outpatient Dialysis Orders: MWF - [place]  4hrs, BFR 400, DFR 600,  AVF, EDW 146kg, 3K/ 2Ca - last hep B labs 8/21 - Heparin 2500 units IV with HD - Hectorol 53mcg IV q HD, held as of 10/30/21  Assessment/Plan: ESRD:MWF HD off schedule- will do TTS this week and get back to MWF next week.   TDC malfunction: Had required two [facility] exchanges and having issues again on 9/4, got overnight tPA dwell. IR thinks TDC is positional as no fibrin on tip at last exchange. Discussed the need for a long term solution, VVS consulted - plan was for RUE AVF 12/19/21 but was postponed. Heparin boluses restarted with dialysis  BP/volume: BP variable, was on low dose metoprolol and midodine -> metop discontinued on 9/1. Last hoyer weight was 118kg which is down significantly from OP EDW -> lower on discharge. BP dropping on HD but she does not seem to have any excess fluid, will lower UF goals Chronic respiratory failure with hypoxia on cannula oxygen at home: on 5L O2 at baseline, have been able to titrate down O2 to 2-3L Anemia of ESRD: Hgb 7.3 - continue  high dose ESA q Monday while here. Tsat was 16 on 9/14 with ferritin 1287, will start short course of IV Fe. Transfuse < 7. Secondary hyperparathyroidism: CorrCa high, VDRA on hold. Renvela increased - Phos elevated but improving.  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 Hypokalemia: Recurrent issue recently and dislikes Klor-Con tabs. Using 3K bath with HD Severe sacral decubitus  wound: Per primary/wound care team. Disposition: Difficult situation. Prior to admit, patient required 6 staff members and 2 hoyer pads for transfer into HD chair as OP. Could not stay on dialysis for full treatment because of pain of multiple decubitus ulcers and PTAR unable to lift pt into ambulance for transport. SW has been unable to locate stretcher dialysis location so far. Her weight is much lower from admit, asked PT about trying to hoyer to recliner for HD -> apparently would need hoyer that wraps around her legs and patient is only able to tolerate <1 hour sitting up at this point. Please see renal navigator note from 12/23/21   Madelon Lips MD 12/31/2021, 10:48 AM  [facility] [phone]

## 2021-12-31 NOTE — Procedures (Signed)
HD Note:  Some information was entered later than the data was gathered due to patient care needs. The stated time with the data is accurate.   Patient arrived on the KDU alert and oriented with no complaints of pain.  At 0900, patient requested that she have UF set for 2500 instead of 1000.  Juanell Fairly, PA was on the unit and agreed to the change.  Also given direction to give the Aranesp today as patient missed dialysis yesterday due to multiple refusals.  Patient post assessment unchanged from pre assessment

## 2021-12-31 NOTE — Progress Notes (Signed)
PT Cancellation Note  Patient Details Name: Trace Cederberg MRN: [id number] DOB: Jun 09, 1970   Cancelled Treatment:    Reason Eval/Treat Not Completed: (P) Patient at procedure or test/unavailable Pt is off floor for HD.PT will follow back this afternoon as able.  Tyeasha Ebbs B. Migdalia Dk PT, DPT Acute Rehabilitation Services Please use secure chat or  Call Office [phone]    [place] 12/31/2021, 8:29 AM

## 2021-12-31 NOTE — TOC Progression Note (Signed)
Transition of Care [facility]) - Initial/Assessment Note    Patient Details  Name: Courtney Gray MRN: [id number] Date of Birth: 04-24-1970  [facility] CM/SW Contact:    Milinda Antis, [place] Phone Number: 12/31/2021, 4:05 PM  Clinical Narrative:                 CSW presented patient at [facility] length of stay meeting and inquired about the patient being added to the STAR program to receive more rehab inpatient to increase the patient' ability to sit for dialysis.  The patient has been in the wheelchair with PT recently, is losing weight, and her home dialysis clinic is willing to consider the patient returning if she can be transferred via hoyer lift into a dialysis chair.    TOC will continue to follow and awaits determination about STAR program.   Expected Discharge Plan: Long Term Nursing Home Barriers to Discharge: No SNF bed, Transportation, Waiting for outpatient dialysis   Patient Goals and CMS Choice Patient states their goals for this hospitalization and ongoing recovery are:: To be at a facility close to her daughter      Expected Discharge Plan and Services Expected Discharge Plan: [place]       Living arrangements for the past 2 months: [place]                                      Prior Living Arrangements/Services Living arrangements for the past 2 months: Single Family Home Lives with:: Adult Children Patient language and need for interpreter reviewed:: Yes        Need for Family Participation in Patient Care: Yes (Comment) Care giver support system in place?: No (comment)   Criminal Activity/Legal Involvement Pertinent to Current Situation/Hospitalization: No - Comment as needed  Activities of Daily Living Home Assistive Devices/Equipment: [employer] fast streamer ADL Screening (condition at time of admission) Patient's cognitive ability adequate to safely complete daily activities?: No Is the patient deaf or have difficulty  hearing?: No Does the patient have difficulty seeing, even when wearing glasses/contacts?: No Does the patient have difficulty concentrating, remembering, or making decisions?: No Patient able to express need for assistance with ADLs?: Yes Does the patient have difficulty dressing or bathing?: Yes Independently performs ADLs?: No Does the patient have difficulty walking or climbing stairs?: Yes Weakness of Legs: Both Weakness of Arms/Hands: Both  Permission Sought/Granted Permission sought to share information with : Other (comment) Permission granted to share information with : Yes, Verbal Permission Granted  Share Information with NAME: Courtney Gray (Daughter)   [phone]  Permission granted to share info w AGENCY: SNF        Emotional Assessment Appearance:: Appears older than stated age Attitude/Demeanor/Rapport: Engaged Affect (typically observed): Tearful/Crying, Accepting Orientation: : Oriented to Situation, Oriented to  Time, Oriented to Place, Oriented to Self Alcohol / Substance Use: Not Applicable Psych Involvement: No (comment)  Admission diagnosis:  End stage renal disease ([place]) [N18.6] Fluid overload [E87.70] Obesity with serious comorbidity, unspecified classification, unspecified obesity type [E66.9] Pressure injury of right buttock, stage 2 ([place]) [L89.312] Patient Active Problem List   Diagnosis Date Noted   Acute on chronic diastolic CHF (congestive heart failure) ([place]) 12/12/2021   PAD (peripheral artery disease) ([place]) 12/12/2021   Irritable bowel syndrome (IBS) 12/12/2021   Amputation of left middle finger 12/12/2021   GERD (gastroesophageal reflux disease) 12/12/2021  Hypertension    Type 2 diabetes mellitus with hyperlipidemia (HCC)    Decubitus ulcer of sacral region, stage 2 (HCC)    Class 3 obesity (HCC)    Bacteremia    PVD (peripheral vascular disease) (HCC)    Hepatic abscess    Pressure injury of skin 09/13/2021   S/P AKA (above knee  amputation) bilateral ([place])    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   Lactic acidosis 09/10/2021   Mixed diabetic hyperlipidemia associated with type 2 diabetes mellitus ([place]) 09/10/2021   Coronary artery disease involving native coronary artery of native heart without angina pectoris 09/10/2021   Elevated troponin level not due myocardial infarction 09/10/2021   Hypotension 09/10/2021   Dry gangrene (HCC)    Pain 08/03/2021   Ischemic foot pain at rest 08/03/2021   Hypoglycemia 07/10/2021   Volume overload 07/09/2021   Leukocytosis 06/11/2021   Acute gout 06/11/2021   Hyperkalemia 05/21/2021   Hemorrhoids    Hyperlipidemia 05/18/2021   Rectal bleeding 05/17/2021   Morbid obesity with BMI of 60.0-69.9, adult ([place]) 05/17/2021   NSTEMI (non-ST elevated myocardial infarction) ([place]) 05/16/2021   Essential hypertension 09/01/2020   End stage renal disease ([place]) 09/01/2020   Chronic respiratory failure with hypoxia ([place]) 09/01/2020   Hypertensive emergency    Hypertensive urgency 11/28/2019   Uncontrolled type 2 diabetes mellitus with hypoglycemia, with long-term current use of insulin ([place]) 11/28/2019   Anemia due to chronic kidney disease 11/28/2019   PCP:  Monico Blitz, MD Pharmacy:   [place], [place] [address] [place] [address] Phone: [phone] Fax: [phone]  [facility] [address] [place] [address] Phone: [phone] Fax: [phone]  [place], [place] - [address] [place] [address] Phone: [phone] Fax: [phone]     Social Determinants of Health ([place]) Interventions    Readmission Risk Interventions    09/16/2021    4:36 PM 08/28/2021   11:50 AM 07/12/2021   12:38 PM  Readmission Risk Prevention Plan  Transportation Screening Complete Complete Complete  Medication Review (RN Care Manager)  Complete Complete  PCP  or Specialist appointment within 3-5 days of discharge  Complete Complete  HRI or Home Care Consult Complete Complete Complete  SW Recovery Care/Counseling Consult Complete Complete Complete  Palliative Care Screening  Not Applicable Not Applicable  Skilled Nursing Facility Complete Not Applicable Complete

## 2021-12-31 NOTE — Progress Notes (Signed)
Progress Note   Patient: Courtney Gray UUV:[id number] DOB: June 01, 1970 DOA: 11/01/2021     60 DOS: the patient was seen and examined on 12/31/2021   Brief hospital course: 51 year old F with PMH of ESRD on HD TTS, recent hospitalization for bacteremia, hepatic and splenic abscess on long-term IV antibiotics, diastolic CHF, PVD s/p bilateral AKA, IDDM-2, morbid obesity and chronic sacral wound presenting with progressive shortness of breath in the setting of missed dialysis and admitted for hypertensive emergency.  Per patient, she never missed her dialysis however according to outpatient HD records, it takes at least 4 staff members and [facility] lift for patient to be seated in the dialysis chair.  Symptoms improved with hemodialysis.  Patient underwent R UE venogram on 8/30.  Vascular surgery following for possible RUE HD access.  Patient is now waiting placement where she can get HD on stretcher that has been difficult to find.  Assessment and Plan: ESRD on HD -Reportedly needed 4 people and [place] lift outpatient HD -Per vascular, patient postponed  aVF placement due to cold-like symptoms -Not able to find stretcher dialysis location.  Final plan on her HD unclear. TOC continues to follow -Cont with PT as tolerated   Chronic hypoxic respiratory failure: 3 L at baseline.  Now saturating at 100% on 2 L.  Reports saturation of 90. -Weaning off oxygen as tolerated -Currently remains on 2LNC   Acute on chronic diastolic CHF/volume overload/hypertensive emergency -Nephrology managing volume with HD -Imdur and metoprolol on hold due to hypotension -Continue midodrine   Hypotension: Resolved.  -Continue midodrine -Continue holding Imdur and metoprolol.   Anemia of CKD: H&H relatively stable. -Could benefit from a unit of blood -IV iron per nephrology.   Controlled IDDM-2: A1c 5.3%.  Discontinued CBG monitoring and SSI.   Recent septic shock secondary to Bacteroides, bacteremia and liver, and  splenic abscess-completed antibiotic course.   Leukocytosis: Seems chronic but improved. -Monitor intermittently   PAD s/p bilateral AKA -Continue aspirin and statin. -Requested RN to replace mattress and bed   Hyperlipidemia -Statin.    Hypomagnesemia/hypokalemia/hyponatremia/hypophosphatemia -Per nephrology   Irritable bowel syndrome with constipation. Intractable nausea and vomiting -Continue Amitiza.   Sneezing, headache and fatigue: Likely allergy and oxygen.  COVID-19 and full RVP nonreactive.  -Continue Claritin -Wean oxygen as tolerated   Morbid obesity Body mass index is 44.68 kg/m. Recommend diet/lifestyle modification       Subjective: Seen on HD. Without complaints  Physical Exam: Vitals:   12/31/21 1124 12/31/21 1130 12/31/21 1200 12/31/21 1230  BP: (!) 88/49 109/61 (!) 120/30 (!) 140/24  Pulse: 69 70 70 71  Resp: 11 18 11 12   Temp:      TempSrc:      SpO2: 100% 100% 100% (!) 89%  Weight:      Height:       General exam: Awake, laying in bed, in nad Respiratory system: Normal respiratory effort, no wheezing Cardiovascular system: regular rate, s1, s2 Gastrointestinal system: Soft, nondistended, positive BS Central nervous system: CN2-12 grossly intact, strength intact Extremities: Perfused, no clubbing, s/p BLE amputation Skin: Normal skin turgor, no notable skin lesions seen Psychiatry: Mood normal // no visual hallucinations   Data Reviewed:  Labs reviewed: Na 136, K 5.7, Hgb 7.5  Family Communication: Pt in room, family not at bedside  Disposition: Status is: Inpatient Remains inpatient appropriate because: Severity of illness  Planned Discharge Destination:  Unclear at this time     Author: Marylu Lund, MD 12/31/2021 12:58 PM  For on call review [email or web address].

## 2022-01-01 DIAGNOSIS — N186 End stage renal disease: Secondary | ICD-10-CM | POA: Diagnosis not present

## 2022-01-01 MED ORDER — SODIUM CHLORIDE 0.9 % IV SOLN
125.0000 mg | INTRAVENOUS | Status: AC
  Administered 2022-01-04 – 2022-01-08 (×3): 125 mg via INTRAVENOUS
  Filled 2022-01-01 (×4): qty 10

## 2022-01-01 NOTE — Progress Notes (Signed)
Physical Therapy Treatment Patient Details Name: Courtney Gray MRN: [id number] DOB: 12-15-1970 Today's Date: 01/01/2022   History of Present Illness Pt adm 7/28 with acute pulmonary edema. 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    PT Comments    Patient agreeable to work on OOB to wheelchair. Unfortunately, nursing changing wound dressing to rt posterior thigh on arrival. RN just giving pain medicine at beginning of session. Patient willing to get OOB with [facility] lift, however upon lowering her onto Roho cushion in wheelchair, pain increased to 8/10 and she could not tolerate sitting in chair. Raised patient  with lift and added blue air cushion on top of Roho and again tried lowering pt to sit on cushions with similar result (too painful). Patient returned to bed via lift. In the future will try not to time PT right after wound care and assure pt has pain medicine prior to session. May try Roho cushion centered under right thigh/ischium and folded blue cushion in the remaining space in the chair seat. Unfortunately, this is the only size Roho cushion available and is slightly too narrow for patient.    Recommendations for follow up therapy are one component of a multi-disciplinary discharge planning process, led by the attending physician.  Recommendations may be updated based on patient status, additional functional criteria and insurance authorization.  Follow Up Recommendations  Skilled nursing-short term rehab (<3 hours/day) Can patient physically be transported by private vehicle: No   Assistance Recommended at Discharge Frequent or constant Supervision/Assistance  Patient can return home with the following Two people to help with walking and/or transfers;Two people to help with bathing/dressing/bathroom;Assist for transportation;Assistance with cooking/housework;Help with stairs or ramp for  entrance   Equipment Recommendations  Wheelchair (measurements PT);Wheelchair cushion (measurements PT);Other (comment);Hospital bed (Needs wide w/c with cushion and anti-tippers with ?offset wheels to reduce risk of tipping backwards)    Recommendations for Other Services       Precautions / Restrictions Precautions Precautions: Fall Precaution Comments: bil AKA, 3L O2 at baseline, LUQ drain Restrictions Weight Bearing Restrictions: Yes RLE Weight Bearing: Non weight bearing LLE Weight Bearing: Non weight bearing Other Position/Activity Restrictions: minimized pushing/pulling and weight through L hand, dressing now removed     Mobility  Bed Mobility Overal bed mobility: Needs Assistance Bed Mobility: Rolling Rolling: Min assist, Supervision         General bed mobility comments: supervision to roll to right; min assist to roll to left (with rails)    Transfers Overall transfer level: Needs assistance Equipment used:  (maxisky) Transfers: Bed to chair/wheelchair/BSC             General transfer comment: lifted to and from wheelchair with [employer] and amputee pad Transfer via Lift Equipment: Maxisky (with amputee lift pad)  Ambulation/Gait               General Gait Details: unable   [name], [name] (indicate cue type and reason): unable to work on propulsion due to incr pain Rt posterior thigh/ischial pain  Modified Rankin (Stroke Patients Only)       Balance Overall balance assessment: Needs assistance Sitting-balance support: No upper extremity supported Sitting balance-[name] Scale: Good  Cognition Arousal/Alertness: Awake/alert Behavior During Therapy: WFL for tasks assessed/performed Overall Cognitive Status: Within Functional Limits for tasks assessed                                           Exercises      General Comments        Pertinent Vitals/Pain Pain Assessment Pain Assessment: 0-10 Pain Score: 8  Pain Location: R ischial pressure wound Pain Descriptors / Indicators: Guarding, Sore, Grimacing, Moaning Pain Intervention(s): Limited activity within patient's tolerance, Monitored during session, RN gave pain meds during session    Home Living                          Prior Function            PT Goals (current goals can now be found in the care plan section) Acute Rehab PT Goals Patient Stated Goal: Pt wants to get up into a w/c Time For Goal Achievement: 01/09/22 Potential to Achieve Goals: Fair Progress towards PT goals: Not progressing toward goals - comment (due to pain this session)    Frequency    Min 2X/week      PT Plan Current plan remains appropriate;Frequency needs to be updated    Co-evaluation              AM-PAC PT "6 Clicks" Mobility   Outcome Measure  Help needed turning from your back to your side while in a flat bed without using bedrails?: A Little Help needed moving from lying on your back to sitting on the side of a flat bed without using bedrails?: Total Help needed moving to and from a bed to a chair (including a wheelchair)?: Total Help needed standing up from a chair using your arms (e.g., wheelchair or bedside chair)?: Total Help needed to walk in hospital room?: Total Help needed climbing 3-5 steps with a railing? : Total 6 Click Score: 8    End of Session Equipment Utilized During Treatment: Oxygen Activity Tolerance: Patient limited by pain Patient left: with call bell/phone within reach;in bed   PT Visit Diagnosis: Other abnormalities of gait and mobility (R26.89);Muscle weakness (generalized) (M62.81) Pain - Right/Left: Right Pain - part of body:  (ischial wound)     Time: 8676-7209 PT Time Calculation (min) (ACUTE ONLY): 36 min  Charges:  $Therapeutic Activity: 23-37 mins                       Arby Barrette, PT Acute Rehabilitation Services  Office [phone]    [name] 01/01/2022, 4:00 PM

## 2022-01-01 NOTE — Progress Notes (Signed)
Progress Note   Patient: Courtney Gray ZYS:[id number] DOB: [birth year] DOA: 11/01/2021     61 DOS: the patient was seen and examined on 01/01/2022   Brief hospital course: 51 year old F ESRD on HD TTS,  recent lengthy hospitalization for Bacteroides [place]omicron bacteremia, hepatic and splenic abscess --required amputation of third digit of left hand and underwent left AKA and had splenic drain as well as CT-guided drainage of liver abscess she had a line holiday on last admission on long-term IV antibiotics until 12/08/2353 diastolic CHF, PVD s/p bilateral AKA, IDDM-2, morbid obesity and  chronic sacral wound presenting with progressive shortness of breath in the setting of missed dialysis and admitted for hypertensive emergency  Per patient, she never missed her dialysis however according to outpatient HD records, it takes at least 4 staff members and [facility] lift for patient to be seated in the dialysis chair.    Symptoms improved with hemodialysis.  Patient underwent R UE venogram on 8/30.  Vascular surgery following for possible RUE HD access.    Patient is now waiting placement where she can get HD on stretcher that has been difficult to find.  Assessment and Plan: ESRD on HD -4 people and [place] lift outpatient HD -Per vascular, patient postponed R aVF placement/AVG due to cold-like symptoms on 9/13--we will attempt to reengage them -Not able to find stretcher dialysis location.  Final plan on her HD unclear. TOC continues to follow -Cont with PT as tolerated   Chronic hypoxic respiratory failure: 3 L at baseline.  Now saturating at 100% on 2 L.  Reports saturation of 90. -Weaning off oxygen as tolerated -Currently remains on [facility] and we will try to wean as tolerated further   Acute on chronic diastolic CHF/volume overload/hypertensive emergency -Nephrology managing volume with HD -Imdur and metoprolol on hold due to hypotension -Continue midodrine 10 mg Monday Wednesday  Friday   Hypotension: Resolved.  -Continue midodrine as above   Anemia of CKD: H&H relatively stable. -Transfusion threshold criteria not met -IV iron per nephrology.  Last iron level 28 and saturation ratio 16-repeat in 1 week likely need to redose   Controlled IDDM-2: A1c 5.3%.  Discontinued CBG monitoring and SSI Previously had been on glipizide which has been discontinued   Recent septic shock secondary to Bacteroides, bacteremia and liver, and splenic abscess-completed antibiotic course.   Leukocytosis: Seems chronic but improved. -Monitor intermittently   PAD s/p bilateral AKA -Continue aspirin and statin. -Requested RN to replace mattress and bed   Hyperlipidemia -Statin.    Hypomagnesemia/hypokalemia/hyponatremia/hypophosphatemia -Per nephrology   Irritable bowel syndrome with constipation. Intractable nausea and vomiting -Continue Amitiza.   Sneezing, headache and fatigue: Likely allergy and oxygen.  COVID-19 and full RVP nonreactive.  -Continue Claritin -Wean oxygen as tolerated   Morbid obesity Body mass index is 44.68 kg/m. Recommend diet/lifestyle modification       Subjective: Patient seen and examined in room-states feeling fair-tells me she does not wish to have labs done again today No chest pain no fever Some discomfort in right buttock area-I did not examine wound today  Physical Exam: Vitals:   01/01/22 1220 01/01/22 1500 01/01/22 1545 01/01/22 1558  BP: 127/61   (!) 140/48  Pulse: 74   88  Resp:  _0 Temp:    98.2 F (36.8 C)  TempSrc:    Oral  SpO2:    100%  Weight:      Height:       Awake coherent black female  thick neck Mallampati 4 S1-S2 no murmur rub gallop Chest is clear no wheeze rales rhonchi Abdomen is obese nontender no rebound no guarding Bilateral AKA stumps seem clean with no breakdown I did not examine sacral decubitus today  Data Reviewed:  Labs reviewed: None today-sugars appear to be stable  Family  Communication: Pt in room, family not at bedside  Disposition: Status is: Inpatient Remains inpatient appropriate because: Severity of illness  Planned Discharge Destination:  Unclear at this time     Author: Nita Sells, MD 01/01/2022 4:19 PM  For on call review [email or web address].

## 2022-01-01 NOTE — Progress Notes (Signed)
[facility] Progress Note   Subjective: Seen in room. Worried about her K+. No other C/Os. Eating lunch.   Objective Vitals:   12/31/21 1720 12/31/21 2036 01/01/22 0522 01/01/22 0943  BP: (!) 107/45 (!) 130/44 (!) 111/47 (!) 103/59  Pulse: 73 75 71 74  Resp: 18 18 18 18   Temp: 98.2 F (36.8 C)  98.6 F (37 C) 98.6 F (37 C)  TempSrc: Oral  Oral Oral  SpO2: 100% 100% 100% 100%  Weight:      Height:       Physical Exam General: Chronically ill appearing, NAD Skin: Sacral wounds present Heart: S1,S2, RRR No M/R/G Lungs: CTAB, no WOB Abdomen: Obese, NABS Extremities: Bilateral AKA no stump edema Dialysis Access: LIJ TDC drsg intact    Additional Objective Labs: Basic Metabolic Panel: Recent Labs  Lab 12/25/21 1610 12/27/21 1523 12/31/21 0851  NA 135 133* 136  K 4.2 4.3 5.7*  CL 95* 94* 98  CO2 26 24 24   GLUCOSE 180* 180* 85  BUN 40* 38* 45*  CREATININE 7.89* 7.73* 10.09*  CALCIUM 9.6 9.6 9.7  PHOS 7.5* 7.1* 7.8*   Liver Function Tests: Recent Labs  Lab 12/25/21 1610 12/27/21 1523 12/31/21 0851  ALBUMIN 2.5* 2.6* 2.5*   No results for input(s): "LIPASE", "AMYLASE" in the last 168 hours. CBC: Recent Labs  Lab 12/25/21 1610 12/27/21 1523 12/31/21 0851  WBC 11.0* 12.7* 10.3  HGB 7.3* 7.3* 7.5*  HCT 24.7* 24.8* 24.0*  MCV 100.4* 102.1* 98.8  PLT 305 303 288   Blood Culture    Component Value Date/Time   SDES BLOOD LEFT ANTECUBITAL 10/16/2021 1534   SPECREQUEST  10/16/2021 1534    BOTTLES DRAWN AEROBIC AND ANAEROBIC Blood Culture results may not be optimal due to an inadequate volume of blood received in culture bottles   CULT  10/16/2021 1534    NO GROWTH 5 DAYS Performed at [facility] [address], [place], [place] [address]    REPTSTATUS 10/21/2021 FINAL 10/16/2021 1534    Cardiac Enzymes: No results for input(s): "CKTOTAL", "CKMB", "CKMBINDEX", "TROPONINI" in the last 168 hours. CBG: No results for input(s):  "GLUCAP" in the last 168 hours. Iron Studies: No results for input(s): "IRON", "TIBC", "TRANSFERRIN", "FERRITIN" in the last 72 hours. @lablastinr3 @ Studies/Results: No results found. Medications:  ferric gluconate (FERRLECIT) IVPB 110 mL/hr at 12/31/21 1800    aspirin EC  81 mg Oral Daily   atorvastatin  10 mg Oral Daily   darbepoetin (ARANESP) injection - DIALYSIS  200 mcg Intravenous Q Mon-HD   docusate sodium  100 mg Oral BID   feeding supplement  1 Container Oral TID BM   gabapentin  100 mg Oral Q1200   gabapentin  200 mg Oral QHS   heparin  5,000 Units Subcutaneous Q8H   levothyroxine  100 mcg Oral Q0600   loratadine  10 mg Oral Daily   lubiprostone  8 mcg Oral Q Tue   And   lubiprostone  8 mcg Oral Q Sat   midodrine  10 mg Oral Q M,W,F-HD   pantoprazole  40 mg Oral Daily   sevelamer carbonate  2.4 g Oral TID WC     Outpatient Dialysis Orders: MWF - [place]  4hrs, BFR 400, DFR 600,  AVF, EDW 146kg, 3K/ 2Ca - last hep B labs 8/21 - Heparin 2500 units IV with HD - Hectorol 67mcg IV q HD, held as of 10/30/21   Assessment/Plan: ESRD:MWF usually  but off schedule on T,Th, S this week. Next HD 01/02/2022 TDC malfunction: Had required two [facility] exchanges and having issues again on 9/4, got overnight tPA dwell. IR thinks TDC is positional as no fibrin on tip at last exchange. Discussed the need for a long term solution, VVS consulted - plan was for RUE AVF 12/19/21 but was postponed. Heparin boluses restarted with dialysis  BP/volume: BP variable, was on low dose metoprolol and midodine -> metop discontinued on 9/1. Last hoyer weight was 118kg which is down significantly from OP EDW -> lower on discharge. BP dropping on HD but she does not seem to have any excess fluid, will lower UF goals Chronic respiratory failure with hypoxia on cannula oxygen at home: on 5L O2 at baseline, have been able to titrate down O2 to 2-3L Anemia of ESRD: Hgb 7.3 - continue high dose ESA q  Monday while here. Tsat was 16 on 9/14 with ferritin 1287, will start short course of IV Fe. Transfuse < 7. Secondary hyperparathyroidism: CorrCa high, VDRA on hold. Renvela increased - Phos elevated but improving.  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 Hypokalemia: Recurrent issue recently and dislikes Klor-Con tabs. Using 3K bath with HD Severe sacral decubitus  wound: Per primary/wound care team. Disposition: Difficult situation. Prior to admit, patient required 6 staff members and 2 hoyer pads for transfer into HD chair as OP. Could not stay on dialysis for full treatment because of pain of multiple decubitus ulcers and PTAR unable to lift pt into ambulance for transport. SW has been unable to locate stretcher dialysis location so far. Her weight is much lower from admit, asked PT about trying to hoyer to recliner for HD -> apparently would need hoyer that wraps around her legs and patient is only able to tolerate <1 hour sitting up at this point. Please see renal navigator note from 12/23/21  Jimmye Norman. Amada Hallisey NP-C 01/01/2022, 12:00 PM  [employer] [phone]

## 2022-01-02 DIAGNOSIS — N186 End stage renal disease: Secondary | ICD-10-CM | POA: Diagnosis not present

## 2022-01-02 LAB — CBC
HCT: 24.9 % — ABNORMAL LOW (ref 36.0–46.0)
Hemoglobin: 7.4 g/dL — ABNORMAL LOW (ref 12.0–15.0)
MCH: 30 pg (ref 26.0–34.0)
MCHC: 29.7 g/dL — ABNORMAL LOW (ref 30.0–36.0)
MCV: 100.8 fL — ABNORMAL HIGH (ref 80.0–100.0)
Platelets: 276 10*3/uL (ref 150–400)
RBC: 2.47 MIL/uL — ABNORMAL LOW (ref 3.87–5.11)
RDW: 14.8 % (ref 11.5–15.5)
WBC: 10.7 10*3/uL — ABNORMAL HIGH (ref 4.0–10.5)
nRBC: 0 % (ref 0.0–0.2)

## 2022-01-02 LAB — RENAL FUNCTION PANEL
Albumin: 2.6 g/dL — ABNORMAL LOW (ref 3.5–5.0)
Anion gap: 12 (ref 5–15)
BUN: 39 mg/dL — ABNORMAL HIGH (ref 6–20)
CO2: 28 mmol/L (ref 22–32)
Calcium: 9.9 mg/dL (ref 8.9–10.3)
Chloride: 97 mmol/L — ABNORMAL LOW (ref 98–111)
Creatinine, Ser: 9.1 mg/dL — ABNORMAL HIGH (ref 0.44–1.00)
GFR, Estimated: 5 mL/min — ABNORMAL LOW (ref >60)
Glucose, Bld: 123 mg/dL — ABNORMAL HIGH (ref 70–99)
Phosphorus: 8 mg/dL — ABNORMAL HIGH (ref 2.5–4.6)
Potassium: 5.4 mmol/L — ABNORMAL HIGH (ref 3.5–5.1)
Sodium: 137 mmol/L (ref 135–145)

## 2022-01-02 MED ORDER — ALTEPLASE 2 MG IJ SOLR: 2.0000 mg | Freq: Once | INTRAMUSCULAR | Status: DC | PRN

## 2022-01-02 MED ORDER — ANTICOAGULANT SODIUM CITRATE 4% (200MG/5ML) IV SOLN: 5.0000 mL | Status: DC | PRN

## 2022-01-02 MED ORDER — HEPARIN SODIUM (PORCINE) 1000 UNIT/ML IJ SOLN
INTRAMUSCULAR | Status: AC
  Filled 2022-01-02: qty 2

## 2022-01-02 MED ORDER — HEPARIN SODIUM (PORCINE) 1000 UNIT/ML DIALYSIS
2000.0000 [IU] | Freq: Once | INTRAMUSCULAR | Status: AC
  Administered 2022-01-02: 2000 [IU] via INTRAVENOUS_CENTRAL

## 2022-01-02 MED ORDER — HEPARIN SODIUM (PORCINE) 1000 UNIT/ML DIALYSIS
1000.0000 [IU] | INTRAMUSCULAR | Status: DC | PRN
  Administered 2022-01-02: 1000 [IU]
  Filled 2022-01-02 (×2): qty 1

## 2022-01-02 MED ORDER — LIDOCAINE HCL (PF) 1 % IJ SOLN: 5.0000 mL | INTRAMUSCULAR | Status: DC | PRN

## 2022-01-02 MED ORDER — MIDODRINE HCL 5 MG PO TABS
10.0000 mg | ORAL_TABLET | Freq: Once | ORAL | Status: AC
  Administered 2022-01-02: 10 mg via ORAL

## 2022-01-02 MED ORDER — PENTAFLUOROPROP-TETRAFLUOROETH EX AERO: 1.0000 | INHALATION_SPRAY | CUTANEOUS | Status: DC | PRN

## 2022-01-02 MED ORDER — LIDOCAINE-PRILOCAINE 2.5-2.5 % EX CREA: 1.0000 | TOPICAL_CREAM | CUTANEOUS | Status: DC | PRN

## 2022-01-02 NOTE — Progress Notes (Signed)
Received patient in bed to unit.  Alert and oriented.  Informed consent signed and in chart.   Treatment initiated: 1309 Treatment completed: 1720  Patient tolerated well.  Transported back to the room  Alert, without acute distress.  Hand-off given to patient's nurse.   Access used: catheter Access issues: none  Total UF removed: 1300 Medication(s) given: heparin bolus Post HD VS: 98.3, 132/38/65, HR-74, RR-19 Post HD weight: 115kg  Pt clotted of with 42 minutes to spare. Before we could connect her back to resume treatment pt complained of severe wound pain and drainage. Stated that she didn't want to finish the 42 minutes, that she wanted to go back to her room so they could redress her dressing.  MD on call was made aware.  Pt also had Iron that was suppose to be given Via dialysis cath due to the fact pt doesn't have an IV. I was going to give it within her last hour and was unable to since she didn't resume treatment.   [facility]

## 2022-01-02 NOTE — Progress Notes (Signed)
[facility] Progress Note   Subjective: Seen in room. HD this week on T,Th, S schedule. HD later today.  Patient says "allergies are bothering me".   Objective Vitals:   01/01/22 1558 01/01/22 2047 01/02/22 0501 01/02/22 0838  BP: (!) 140/48 (!) 105/44 (!) 162/64 (!) 118/46  Pulse: 88 71 85 73  Resp: 18 18 16 17   Temp: 98.2 F (36.8 C) 98.9 F (37.2 C) 98.1 F (36.7 C) 98.3 F (36.8 C)  TempSrc: Oral Oral Oral Oral  SpO2: 100% 100% 100% 100%  Weight:      Height:       Physical Exam General: Chronically ill appearing, NAD Skin: Sacral wounds present, foam dressings intact Heart: S1,S2, RRR No M/R/G Lungs: CTAB, no WOB Abdomen: Obese, NABS Extremities: Bilateral AKA no stump edema Dialysis Access: LIJ TDC drsg intact    Additional Objective Labs: Basic Metabolic Panel: Recent Labs  Lab 12/27/21 1523 12/31/21 0851  NA 133* 136  K 4.3 5.7*  CL 94* 98  CO2 24 24  GLUCOSE 180* 85  BUN 38* 45*  CREATININE 7.73* 10.09*  CALCIUM 9.6 9.7  PHOS 7.1* 7.8*   Liver Function Tests: Recent Labs  Lab 12/27/21 1523 12/31/21 0851  ALBUMIN 2.6* 2.5*   No results for input(s): "LIPASE", "AMYLASE" in the last 168 hours. CBC: Recent Labs  Lab 12/27/21 1523 12/31/21 0851  WBC 12.7* 10.3  HGB 7.3* 7.5*  HCT 24.8* 24.0*  MCV 102.1* 98.8  PLT 303 288   Blood Culture    Component Value Date/Time   SDES BLOOD LEFT ANTECUBITAL 10/16/2021 1534   SPECREQUEST  10/16/2021 1534    BOTTLES DRAWN AEROBIC AND ANAEROBIC Blood Culture results may not be optimal due to an inadequate volume of blood received in culture bottles   CULT  10/16/2021 1534    NO GROWTH 5 DAYS Performed at [facility], [place] [address], [place], [place] [address]    REPTSTATUS 10/21/2021 FINAL 10/16/2021 1534    Cardiac Enzymes: No results for input(s): "CKTOTAL", "CKMB", "CKMBINDEX", "TROPONINI" in the last 168 hours. CBG: No results for input(s): "GLUCAP" in the last 168  hours. Iron Studies: No results for input(s): "IRON", "TIBC", "TRANSFERRIN", "FERRITIN" in the last 72 hours. @lablastinr3 @ Studies/Results: No results found. Medications:  ferric gluconate (FERRLECIT) IVPB      aspirin EC  81 mg Oral Daily   atorvastatin  10 mg Oral Daily   darbepoetin (ARANESP) injection - DIALYSIS  200 mcg Intravenous Q Mon-HD   docusate sodium  100 mg Oral BID   feeding supplement  1 Container Oral TID BM   gabapentin  100 mg Oral Q1200   gabapentin  200 mg Oral QHS   heparin  5,000 Units Subcutaneous Q8H   levothyroxine  100 mcg Oral Q0600   loratadine  10 mg Oral Daily   lubiprostone  8 mcg Oral Q Tue   And   lubiprostone  8 mcg Oral Q Sat   midodrine  10 mg Oral Q M,W,F-HD   pantoprazole  40 mg Oral Daily   sevelamer carbonate  2.4 g Oral TID WC     Outpatient Dialysis Orders: MWF - [place]  4hrs, BFR 400, DFR 600,  AVF, EDW 146kg, 3K/ 2Ca - last hep B labs 8/21 - Heparin 2500 units IV with HD - Hectorol 9mcg IV q HD, held as of 10/30/21   Assessment/Plan: ESRD:MWF usually but off schedule on T,Th, S this week. Next HD  01/02/2022 TDC malfunction: Had required two [facility] exchanges and having issues again on 9/4, got overnight tPA dwell. IR thinks TDC is positional as no fibrin on tip at last exchange. Discussed the need for a long term solution, VVS consulted - plan was for RUE AVF 12/19/21 but was postponed. Heparin boluses restarted with dialysis  BP/volume: BP variable, was on low dose metoprolol and midodine -> metop discontinued on 9/1. Last hoyer weight was 118kg which is down significantly from OP EDW -> lower on discharge. BP dropping on HD but she does not seem to have any excess fluid, will lower UF goals Chronic respiratory failure with hypoxia on cannula oxygen at home: on 5L O2 at baseline, have been able to titrate down O2 to 2-3L Anemia of ESRD: Hgb 7.3 - continue high dose ESA q Monday while here. Tsat was 16 on 9/14 with  ferritin 1287, will start short course of IV Fe. Transfuse < 7. Secondary hyperparathyroidism: CorrCa high, VDRA on hold. Renvela increased - Phos elevated but improving.  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 Hypokalemia: Recurrent issue recently and dislikes Klor-Con tabs. Using 3K bath with HD Severe sacral decubitus  wound: Per primary/wound care team. Disposition: Difficult situation. Prior to admit, patient required 6 staff members and 2 hoyer pads for transfer into HD chair as OP. Could not stay on dialysis for full treatment because of pain of multiple decubitus ulcers and PTAR unable to lift pt into ambulance for transport. SW has been unable to locate stretcher dialysis location so far. Her weight is much lower from admit, asked PT about trying to hoyer to recliner for HD -> apparently would need hoyer that wraps around her legs and patient is only able to tolerate <1 hour sitting up at this point. Please see renal navigator note from 12/23/21  Jimmye Norman. Kura Bethards NP-C 01/02/2022, 9:36 AM  [employer] [phone]

## 2022-01-02 NOTE — Progress Notes (Signed)
Progress Note   Patient: Courtney Gray BHA:[id number] DOB: Jun 10, 1970 DOA: 11/01/2021     62 DOS: the patient was seen and examined on 01/02/2022   Brief hospital course: 51 year old F ESRD on HD TTS,  recent lengthy hospitalization for Bacteroides Thetaiotaomicron bacteremia, hepatic and splenic abscess --required amputation of third digit of left hand and underwent left AKA and had splenic drain as well as CT-guided drainage of liver abscess she had a line holiday on last admission on long-term IV antibiotics until 12/13/3530 diastolic CHF, PVD s/p bilateral AKA, IDDM-2, morbid obesity and  chronic sacral wound presenting with progressive shortness of breath in the setting of missed dialysis and admitted for hypertensive emergency  Per patient, she never missed her dialysis however according to outpatient HD records, it takes at least 4 staff members and [facility] lift for patient to be seated in the dialysis chair.    Symptoms improved with hemodialysis.  Patient underwent R UE venogram on 8/30.  Vascular surgery following for possible RUE HD access.    Patient is now waiting placement where she can get HD on stretcher that has been difficult to find.  Assessment and Plan: ESRD on HD -4 people and [place] lift outpatient HD -Per vascular, patient postponed R aVF placement/AVG ---to be performed 10/1 -Not able to find stretcher dialysis location.  Final plan on her HD unclear. TOC continues to follow -Cont with PT as tolerated   Chronic hypoxic respiratory failure: 3 L at baseline.  Now saturating at 100% on 2 L.  Reports saturation of 90. -Weaning off oxygen as tolerated -Currently remains on [facility] and we will try to wean as tolerated further   Acute on chronic diastolic CHF/volume overload/hypertensive emergency -Nephrology managing volume with HD -Imdur and metoprolol on hold due to hypotension -Continue midodrine 10 mg Monday Wednesday Friday   Hypotension: Resolved.  -Continue  midodrine as above   Anemia of CKD: H&H relatively stable. -Transfusion threshold criteria not met -IV iron per nephrology.  Last iron level 28 and saturation ratio 16-repeat ~10/3 likely need to redose   Controlled IDDM-2: A1c 5.3%.  Discontinued CBG monitoring and SSI Previously had been on glipizide which has been discontinued   Recent septic shock secondary to Bacteroides, bacteremia and liver, and splenic abscess-completed antibiotic course.   Leukocytosis: Seems chronic but improved. -Monitor intermittently   PAD s/p bilateral AKA -Continue aspirin and statin. -Requested RN to replace mattress and bed   Hyperlipidemia -Statin.    Hypomagnesemia/hypokalemia/hyponatremia/hypophosphatemia -Per nephrology   Irritable bowel syndrome with constipation. Intractable nausea and vomiting -Continue Amitiza.   Snacral decub Wound not examined Endorses worse pain Advised non pharmacological strategies and mobility/turning Will review wound in am   Morbid obesity Body mass index is 44.68 kg/m. Recommend diet/lifestyle modification       Subjective:   Some pain in R decubitus No cp/fever Eating and drinking   Physical Exam: Vitals:   01/02/22 0838 01/02/22 1235 01/02/22 1245 01/02/22 1309  BP: (!) 118/46  (!) 118/53 (!) 105/47  Pulse: 73  74 78  Resp: 17  (!) 21 18  Temp: 98.3 F (36.8 C)  98.3 F (36.8 C)   TempSrc: Oral  Oral   SpO2: 100%  100% 100%  Weight:  117 kg    Height:       coherent black female thick neck Mallampati 4 S1-S2 no murmur rub gallop Chest is clear no wheeze rales rhonchi Abdomennontender no rebound no guarding Bilateral AKA stumps seem clean with  no breakdown I did not examine sacral decubitus today  Data Reviewed:  Labs reviewed: None today-sugars appear to be stable  Family Communication: Pt in room, family not at bedside  Disposition: Status is: Inpatient Remains inpatient appropriate because: Severity of illness  Planned  Discharge Destination:  Unclear at this time     Author: Nita Sells, MD 01/02/2022 1:48 PM  For on call review [email or web address].

## 2022-01-02 NOTE — Progress Notes (Signed)
    Patient is now amenable to right arm AV fistula or graft next Monday, October 2.  I discussed with her that I am not available but I can have one of my partners perform this operation and she is agreeable to proceed.  She will need to be n.p.o. past midnight on Sunday, October 1.  Kayliee Atienza C. Donzetta Matters, MD [facility] Office: [phone] Pager: [phone]

## 2022-01-03 DIAGNOSIS — N186 End stage renal disease: Secondary | ICD-10-CM | POA: Diagnosis not present

## 2022-01-03 MED ORDER — SEVELAMER CARBONATE 0.8 G PO PACK
3.2000 g | PACK | Freq: Three times a day (TID) | ORAL | Status: DC
  Administered 2022-01-03 – 2022-01-26 (×31): 3.2 g via ORAL
  Filled 2022-01-03 (×41): qty 4

## 2022-01-03 NOTE — Progress Notes (Signed)
Physical Therapy Treatment Patient Details Name: Courtney Gray MRN: [id number] DOB: 01-28-1971 Today's Date: 01/03/2022   History of Present Illness Pt adm 7/28 with acute pulmonary edema. 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    PT Comments    PT attempted to accommodate limiting factors to last patient session by scheduling a time agreeable to patient and working with nursing staff to have dressing changed an hour prior and pain medication administered 30 min prior to session. Pt reports she has not been premedicated prior to session. RN notified and pain medication administered. Pt reports needing to wait until pain medication takes effect. 30 minutes later pt agreeable to lift to chair although argues cushion placement. PT educated that hips need to be in neutral position so that she can maximize UE propulsion. Once up in wheelchair pt needing light min A for steering with B UE propulsion 300 feet. PT asks that pt take more ownership of asking for pain medication 30 minutes prior to session to maximize pt therapeutic time. Pt reports that the floor is understaffed and they won't come when she calls. In trying to reinforce pt ownership of her own pain management pt states "I don't have any legs what do you want me to do?" PT and Mobility Tech in pt room for 92 minutes, only 40 of which were billable for therapeutic activity. PT will continue to work on pt sitting tolerance, however may need more staff support to facilitate proper use of therapy time. Continue to work towards goal of being able to be discharge to SNF with ability to sit in wheelchair for 3 hours for HD.    Recommendations for follow up therapy are one component of a multi-disciplinary discharge planning process, led by the attending physician.  Recommendations may be updated based on patient status, additional functional criteria and insurance  authorization.  Follow Up Recommendations  Skilled nursing-short term rehab (<3 hours/day) Can patient physically be transported by private vehicle: No   Assistance Recommended at Discharge Frequent or constant Supervision/Assistance  Patient can return home with the following Two people to help with walking and/or transfers;Two people to help with bathing/dressing/bathroom;Assist for transportation;Assistance with cooking/housework;Help with stairs or ramp for entrance   Equipment Recommendations  Wheelchair (measurements PT);Wheelchair cushion (measurements PT);Other (comment);Hospital bed (Needs wide w/c with cushion and anti-tippers with ?offset wheels to reduce risk of tipping backwards)       Precautions / Restrictions Precautions Precautions: Fall Precaution Comments: bil AKA, 3L O2 at baseline, LUQ drain Restrictions Weight Bearing Restrictions: Yes RLE Weight Bearing: Non weight bearing LLE Weight Bearing: Non weight bearing Other Position/Activity Restrictions: minimized pushing/pulling and weight through L hand, dressing now removed     Mobility  Bed Mobility Overal bed mobility: Needs Assistance Bed Mobility: Rolling Rolling: Min assist, Min guard         General bed mobility comments: min guard to minA for rolling R and L for lift pad placement and removal    Transfers Overall transfer level: Needs assistance Equipment used:  (maxisky) Transfers: Bed to chair/wheelchair/BSC             General transfer comment: lifted to and from wheelchair with [employer] and amputee pad Transfer via Lift Equipment: Maxisky (with amputee lift pad)  Ambulation/Gait               General Gait Details: unable  Information systems manager mobility: Yes Wheelchair propulsion: Both upper extremities Wheelchair parts: Supervision/cueing Distance: 300 Wheelchair Assistance Details (indicate cue type and reason): pt able to be positioned  in wheelchair with [employer] cushion on R side with slight curl up the side of the wheelchair seat and folded over geomat on the L side to level the seat. pt report slightly painful on R ischial wound. but able to perform better propulsion in with neutral hip positioning  Modified Rankin (Stroke Patients Only)       Balance Overall balance assessment: Needs assistance Sitting-balance support: No upper extremity supported Sitting balance-[name] Scale: Good Sitting balance - Comments: in wheelchair; pt requests chest strap (made with gait belts) due to fear of falling                                    Cognition Arousal/Alertness: Awake/alert Behavior During Therapy: WFL for tasks assessed/performed Overall Cognitive Status: Within Functional Limits for tasks assessed                                             General Comments General comments (skin integrity, edema, etc.): VSS on 2L O2 via       Pertinent Vitals/Pain Pain Assessment Pain Assessment: Faces Faces Pain Scale: Hurts a little bit Pain Location: R ischial pressure wound Pain Descriptors / Indicators: Guarding, Sore, Grimacing, Moaning Pain Intervention(s): Limited activity within patient's tolerance, Monitored during session, Premedicated before session, RN gave pain meds during session, Repositioned     PT Goals (current goals can now be found in the care plan section) Acute Rehab PT Goals Patient Stated Goal: Pt wants to get up into a w/c PT Goal Formulation: With patient Time For Goal Achievement: 01/09/22 Potential to Achieve Goals: Fair Progress towards PT goals: Progressing toward goals    Frequency    Min 2X/week      PT Plan Current plan remains appropriate;Frequency needs to be updated       AM-PAC PT "6 Clicks" Mobility   Outcome Measure  Help needed turning from your back to your side while in a flat bed without using bedrails?: A Little Help needed moving  from lying on your back to sitting on the side of a flat bed without using bedrails?: Total Help needed moving to and from a bed to a chair (including a wheelchair)?: Total Help needed standing up from a chair using your arms (e.g., wheelchair or bedside chair)?: Total Help needed to walk in hospital room?: Total Help needed climbing 3-5 steps with a railing? : Total 6 Click Score: 8    End of Session Equipment Utilized During Treatment: Oxygen Activity Tolerance: Patient limited by pain Patient left: with call bell/phone within reach;in bed Nurse Communication: Mobility status PT Visit Diagnosis: Other abnormalities of gait and mobility (R26.89);Muscle weakness (generalized) (M62.81) Pain - Right/Left: Right Pain - part of body:  (ischial wound)     Time: 9509-3267 PT Time Calculation (min) (ACUTE ONLY): 92 min  Charges:  $Therapeutic Activity: 8-22 mins $Wheel Chair Management: 23-37 mins                     Ronzell Laban B. Migdalia Dk PT, DPT Acute Rehabilitation Services Please use secure chat or  Call Office [phone]  [place] 01/03/2022, 4:05 PM

## 2022-01-03 NOTE — Progress Notes (Signed)
Progress Note   Patient: Courtney Gray VZS:[id number] DOB: 20-Nov-1970 DOA: 11/01/2021     63 DOS: the patient was seen and examined on 01/03/2022   Brief hospital course: 51 year old F ESRD on HD TTS,  recent lengthy hospitalization for Bacteroides Thetaiotaomicron bacteremia, hepatic and splenic abscess --required amputation of third digit of left hand and underwent left AKA and had splenic drain as well as CT-guided drainage of liver abscess she had a line holiday on last admission on long-term IV antibiotics until 07/10/9199 diastolic CHF, PVD s/p bilateral AKA, IDDM-2, morbid obesity and  chronic sacral wound presenting with progressive shortness of breath in the setting of missed dialysis and admitted for hypertensive emergency  Per patient, she never missed her dialysis however according to outpatient HD records, it takes at least 4 staff members and [facility] lift for patient to be seated in the dialysis chair.    Symptoms improved with hemodialysis.  Patient underwent R UE venogram on 8/30.  Vascular surgery following for possible RUE HD access.    Patient is now waiting placement where she can get HD on stretcher that has been difficult to find.  Assessment and Plan: ESRD on HD -4 people and [place] lift outpatient HD -Per vascular, patient postponed R aVF placement/AVG ---to be performed 10/1 -Not able to find stretcher dialysis location.  Final plan on her HD unclear. TOC continues to follow -We have added a doughnut to her orders and have asked that she be seated up at least 1-2 times a day to ensure that she develops tolerance of this -Cont with PT as tolerated   Chronic hypoxic respiratory failure: 3 L at baseline.  Now saturating at 100% on 2 L.  Reports saturation of 90. -Weaning off oxygen as tolerated -Currently remains on [facility]    Acute on chronic diastolic CHF/volume overload/hypertensive emergency -Nephrology managing volume with HD -Imdur and metoprolol on hold due to  hypotension -Continue midodrine 10 mg Monday Wednesday Friday   Hypotension: Resolved.  -Continue midodrine as above   Anemia of CKD: H&H relatively stable. -Transfusion threshold criteria not met -IV iron per nephrology.  Last iron level 28 and saturation ratio 16-repeat ~10/3 likely need to redose   Controlled IDDM-2: A1c 5.3%.  Discontinued CBG monitoring and SSI Previously had been on glipizide which has been discontinued   Recent septic shock secondary to Bacteroides, bacteremia and liver, and splenic abscess-completed antibiotic course.   Leukocytosis: Seems chronic but improved. -Monitor intermittently   PAD s/p bilateral AKA -Continue aspirin and statin. -Requested RN to replace mattress and bed   Hyperlipidemia -Statin.    Hypomagnesemia/hypokalemia/hyponatremia/hypophosphatemia -Per nephrology   Irritable bowel syndrome with constipation. Intractable nausea and vomiting -Continue Amitiza,, Colace, Zofran as needed   Snacral decub Wound examined on 9/29 looks well-healed I have asked TOC to come back try to comment on duration  Morbid obesity Body mass index is 44.68 kg/m. Recommend diet/lifestyle modification       Subjective:   Pain continues in right decubitus area-wound today looks very clean No fever Otherwise remains the same    Physical Exam: Vitals:   01/02/22 1720 01/02/22 2117 01/03/22 0644 01/03/22 0858  BP: (!) 132/38 (!) 122/94 (!) 143/54 (!) 132/38  Pulse: 74 76 81 72  Resp: 19 16  17   Temp: 98.3 F (36.8 C) 98.4 F (36.9 C) 98.4 F (36.9 C) 98 F (36.7 C)  TempSrc: Oral Oral Oral Oral  SpO2: 96% 100% 100% 100%  Weight: 115 kg  Height:       coherent black female thick neck Mallampati 4 S1-S2 no murmur Chest is clear Decubitus ulcer reviewed on 9/29 as below       Data Reviewed:  Labs reviewed: None today-sugars appear to be stable  Family Communication: Pt in room, family not at bedside  Disposition: Status  is: Inpatient Remains inpatient appropriate because: Severity of illness  Planned Discharge Destination:  Unclear at this time     Author: Nita Sells, MD 01/03/2022 11:25 AM  For on call review [email or web address].

## 2022-01-03 NOTE — Progress Notes (Signed)
[facility] Progress Note   Subjective:  Seen in room. No CP/dyspnea or new complaints today. No issues with HD yesterday - 1.3L UF.  Objective Vitals:   01/02/22 1720 01/02/22 2117 01/03/22 0644 01/03/22 0858  BP: (!) 132/38 (!) 122/94 (!) 143/54 (!) 132/38  Pulse: 74 76 81 72  Resp: 19 16  17   Temp: 98.3 F (36.8 C) 98.4 F (36.9 C) 98.4 F (36.9 C) 98 F (36.7 C)  TempSrc: Oral Oral Oral Oral  SpO2: 96% 100% 100% 100%  Weight: 115 kg     Height:       Physical Exam General: Chronically ill appearing woman, NAD. Heart: RRR; no murmur Lungs: CTA anteriorly, no wheezing Abdomen: soft, obese Extremities: B AKA; no edema Dialysis Access: L TDC, non-tender  Additional Objective Labs: Basic Metabolic Panel: Recent Labs  Lab 12/27/21 1523 12/31/21 0851 01/02/22 1356  NA 133* 136 137  K 4.3 5.7* 5.4*  CL 94* 98 97*  CO2 24 24 28   GLUCOSE 180* 85 123*  BUN 38* 45* 39*  CREATININE 7.73* 10.09* 9.10*  CALCIUM 9.6 9.7 9.9  PHOS 7.1* 7.8* 8.0*   Liver Function Tests: Recent Labs  Lab 12/27/21 1523 12/31/21 0851 01/02/22 1356  ALBUMIN 2.6* 2.5* 2.6*   Recent Labs  Lab 12/27/21 1523 12/31/21 0851 01/02/22 1211  WBC 12.7* 10.3 10.7*  HGB 7.3* 7.5* 7.4*  HCT 24.8* 24.0* 24.9*  MCV 102.1* 98.8 100.8*  PLT 303 288 276   Medications:  ferric gluconate (FERRLECIT) IVPB      aspirin EC  81 mg Oral Daily   atorvastatin  10 mg Oral Daily   darbepoetin (ARANESP) injection - DIALYSIS  200 mcg Intravenous Q Mon-HD   docusate sodium  100 mg Oral BID   feeding supplement  1 Container Oral TID BM   gabapentin  100 mg Oral Q1200   gabapentin  200 mg Oral QHS   heparin  5,000 Units Subcutaneous Q8H   levothyroxine  100 mcg Oral Q0600   loratadine  10 mg Oral Daily   lubiprostone  8 mcg Oral Q Tue   And   lubiprostone  8 mcg Oral Q Sat   midodrine  10 mg Oral Q M,W,F-HD   pantoprazole  40 mg Oral Daily   sevelamer carbonate  2.4 g Oral TID WC     Dialysis Orders: MWF - [place]  4hrs, BFR 400, DFR 600,  AVF, EDW 146kg, 3K/ 2Ca - last hep B labs 8/21 - Heparin 2500 units IV with HD - Hectorol 2mcg IV q HD, held as of 10/30/21  Assessment/Plan: ESRD: Typically MWF, off schedule this week (TTS) - next HD 9/30.  TDC malfunction: Had required two [facility] exchanges and having issues again on 9/4, got overnight tPA dwell. IR thinks TDC is positional as no fibrin on tip at last exchange. Discussed the need for a long term solution, VVS consulted - plan was for RUE AVF 12/19/21 but was postponed - now set for 10/2. Heparin boluses restarted with dialysis  BP/volume: BP variable, was on low dose metoprolol and midodine -> metop discontinued on 9/1. Last hoyer weight was 118kg which is down significantly from OP EDW -> lower on discharge. BP dropping on HD but she does not seem to have any excess fluid, will lower UF goals Chronic respiratory failure with hypoxia on cannula oxygen at home: on 5L O2 at baseline, have been able to titrate down O2 to 2-3L Anemia of  ESRD: Hgb 7.4 - continue high dose ESA q Monday while here. Last tsat 16% on 9/14 with ferritin 1287, will getting short course of IV Fe. Transfuse < 7. Secondary hyperparathyroidism: CorrCa high, VDRA on hold. Renvela increased - Phos still high, ^ further.  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 Severe sacral decubitus wound: Per primary/wound care team. Disposition: Difficult situation. Prior to admit, patient required 6 staff members and 2 hoyer pads for transfer into HD chair as OP. Could not stay on dialysis for full treatment because of pain of multiple decubitus ulcers and PTAR unable to lift pt into ambulance for transport. SW has been unable to locate stretcher dialysis location so far. Her weight is much lower from admit, asked PT about trying to hoyer to recliner for HD -> apparently would need hoyer that wraps around her legs and patient is  only able to tolerate <1 hour sitting up at this point. Please see renal navigator note from 12/23/21    [name], PA-C 01/03/2022, 10:22 AM  [facility]

## 2022-01-04 DIAGNOSIS — N186 End stage renal disease: Secondary | ICD-10-CM | POA: Diagnosis not present

## 2022-01-04 MED ORDER — HEPARIN SODIUM (PORCINE) 1000 UNIT/ML DIALYSIS
20.0000 [IU]/kg | INTRAMUSCULAR | Status: DC | PRN
  Administered 2022-01-04: 2300 [IU] via INTRAVENOUS_CENTRAL
  Filled 2022-01-04: qty 3

## 2022-01-04 MED ORDER — HEPARIN SODIUM (PORCINE) 1000 UNIT/ML IJ SOLN
INTRAMUSCULAR | Status: AC
  Filled 2022-01-04: qty 3

## 2022-01-04 MED ORDER — HEPARIN SODIUM (PORCINE) 1000 UNIT/ML IJ SOLN
INTRAMUSCULAR | Status: AC
  Administered 2022-01-04: 3800 [IU]
  Filled 2022-01-04: qty 4

## 2022-01-04 NOTE — Progress Notes (Signed)
[facility] Progress Note   Subjective:  Seen just starting dialysis.  Feeling well today- able to eat breakfast before coming to HD  Objective Vitals:   01/03/22 2131 01/04/22 0555 01/04/22 0914 01/04/22 0930  BP: (!) 107/45 116/74 (!) 130/55 (!) 116/55  Pulse: 75 69 78 70  Resp: 17 17 20 14   Temp: 98.1 F (36.7 C) 98.6 F (37 C) 98.6 F (37 C)   TempSrc: Oral Oral Oral   SpO2: 100% 100% 100% 100%  Weight:      Height:       Physical Exam General: Chronically ill appearing woman, NAD. Heart: RRR; no murmur Lungs: CTA anteriorly, no wheezing Abdomen: soft, obese Extremities: B AKA; no edema Dialysis Access: L TDC, non-tender  Additional Objective Labs: Basic Metabolic Panel: Recent Labs  Lab 12/31/21 0851 01/02/22 1356  NA 136 137  K 5.7* 5.4*  CL 98 97*  CO2 24 28  GLUCOSE 85 123*  BUN 45* 39*  CREATININE 10.09* 9.10*  CALCIUM 9.7 9.9  PHOS 7.8* 8.0*   Liver Function Tests: Recent Labs  Lab 12/31/21 0851 01/02/22 1356  ALBUMIN 2.5* 2.6*   Recent Labs  Lab 12/31/21 0851 01/02/22 1211  WBC 10.3 10.7*  HGB 7.5* 7.4*  HCT 24.0* 24.9*  MCV 98.8 100.8*  PLT 288 276   Medications:  ferric gluconate (FERRLECIT) IVPB      aspirin EC  81 mg Oral Daily   atorvastatin  10 mg Oral Daily   darbepoetin (ARANESP) injection - DIALYSIS  200 mcg Intravenous Q Mon-HD   docusate sodium  100 mg Oral BID   feeding supplement  1 Container Oral TID BM   gabapentin  100 mg Oral Q1200   gabapentin  200 mg Oral QHS   heparin  5,000 Units Subcutaneous Q8H   heparin sodium (porcine)       levothyroxine  100 mcg Oral Q0600   loratadine  10 mg Oral Daily   lubiprostone  8 mcg Oral Q Tue   And   lubiprostone  8 mcg Oral Q Sat   midodrine  10 mg Oral Q M,W,F-HD   pantoprazole  40 mg Oral Daily   sevelamer carbonate  3.2 g Oral TID WC    Dialysis Orders: MWF - [facility]  4hrs, BFR 400, DFR 600,  AVF, EDW 146kg, 3K/ 2Ca - last hep B  labs 8/21 - Heparin 2500 units IV with HD - Hectorol 69mcg IV q HD, held as of 10/30/21  Assessment/Plan: ESRD: Typically MWF, off schedule this week (TTS) - next HD 9/30.  Will get back on normal MWF schedule next week- will need to work around AVF surg.   TDC malfunction: Had required two [facility] exchanges and having issues again on 9/4, got overnight tPA dwell. IR thinks TDC is positional as no fibrin on tip at last exchange. Discussed the need for a long term solution, VVS consulted - plan was for RUE AVF 12/19/21 but was postponed - now set for 10/2. Heparin boluses restarted with dialysis  BP/volume: BP variable, was on low dose metoprolol and midodine -> metop discontinued on 9/1. Last hoyer weight was 118kg which is down significantly from OP EDW -> lower on discharge. BP dropping on HD but she does not seem to have any excess fluid, will lower UF goals Chronic respiratory failure with hypoxia on cannula oxygen at home: on 5L O2 at baseline, have been able to titrate down O2 to 2-3L Anemia of  ESRD: Hgb 7.4 - continue high dose ESA q Monday while here. Last tsat 16% on 9/14 with ferritin 1287, will getting short course of IV Fe. Transfuse < 7. Secondary hyperparathyroidism: CorrCa high, VDRA on hold. Renvela increased - Phos still high, ^ further.  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 Severe sacral decubitus wound: Per primary/wound care team. Disposition: Difficult situation. Prior to admit, patient required 6 staff members and 2 hoyer pads for transfer into HD chair as OP. Could not stay on dialysis for full treatment because of pain of multiple decubitus ulcers and PTAR unable to lift pt into ambulance for transport. SW has been unable to locate stretcher dialysis location so far. Her weight is much lower from admit, asked PT about trying to hoyer to recliner for HD -> apparently would need hoyer that wraps around her legs and patient is only able to tolerate <1 hour  sitting up at this point. Please see renal navigator note from 12/23/21    Madelon Lips MD 01/04/2022, 9:42 AM  [facility]

## 2022-01-04 NOTE — Progress Notes (Signed)
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 and underwent left AKA and had splenic drain as well as CT-guided drainage of liver abscess she had a line holiday on last admission on long-term IV antibiotics until 04/10/7090 diastolic CHF, PVD s/p bilateral AKA, IDDM-2, morbid obesity and  chronic sacral wound presenting with progressive shortness of breath in the setting of missed dialysis and admitted for hypertensive emergency  Per patient, she never missed her dialysis however according to outpatient HD records, it takes at least 4 staff members and [facility] lift for patient to be seated in the dialysis chair.    Symptoms improved with hemodialysis.  Patient underwent R UE venogram on 8/30.  Vascular surgery following for possible RUE HD access.    Patient is now waiting placement where she can get HD on stretcher that has been difficult to find.  Assessment and Plan: ESRD on HD -4 people and [place] lift outpatient HD -Per vascular, patient postponed R aVF placement/AVG ---to be performed 10/1 by VVS -Not able to find stretcher dialysis location.  Final plan on her HD unclear. TOC continues to follow -added a doughnut to her orders [still not received] and have asked that she be seated up at least 1-2 times a day to ensure that she develops tolerance of this -Cont with PT as tolerated   Chronic hypoxic respiratory failure: 3 L at baseline.  Now saturating at 100% on 2 L.  Reports saturation of 90. -Weaning off oxygen as tolerated -Currently remains on 2LNC    Acute on chronic diastolic CHF/volume overload/hypertensive emergency -Nephrology managing volume with HD -Imdur and  metoprolol on hold due to hypotension -Continue midodrine 10 mg Monday Wednesday Friday -Phos being managed by renal   Hypotension: Resolved.  -Continue midodrine as above   Anemia of CKD: H&H relatively stable. -Transfusion threshold criteria not met -IV iron per nephrology.  Last iron level 28 and saturation ratio 16-repeat ~10/3 likely need to redose   Controlled IDDM-2: A1c 5.3%.  Discontinued CBG monitoring and SSI Previously had been on glipizide which has been discontinued   Recent septic shock secondary to Bacteroides, bacteremia and liver, and splenic abscess-completed antibiotic course.   Leukocytosis: Seems chronic but improved. -Monitor intermittently   PAD s/p bilateral AKA -Continue aspirin and statin. -Requested RN to replace mattress and bed   Hyperlipidemia -Statin.    Hypomagnesemia/hypokalemia/hyponatremia/hypophosphatemia -Per nephrology   Irritable bowel syndrome with constipation. Intractable nausea and vomiting -Continue Amitiza,Colace, Zofran as needed   Snacral decub Wound examined on 9/29 looks well-healed I have asked TOC to come back try to comment on duration  Morbid obesity Body mass index is 44.68 kg/m. Recommend diet/lifestyle modification       Subjective:   Overall about the same no new issues Seen on HD unit  Physical Exam: Vitals:   01/04/22 0914 01/04/22 0930 01/04/22 1000 01/04/22 1030  BP: (!) 130/55 (!) 116/55 (!) 141/65 (!) 147/58  Pulse: 78 70 72 63  Resp: 20 14 14 18   Temp: 98.6 F (37 C)     TempSrc: Oral     SpO2: 100% 100% 99% 99%  Weight:      Height:       coherent  black female thick neck Mallampati 4 S1-S2 no murmur Chest is clear Decubitus ulcer no reviewed  Data Reviewed:  Labs reviewed: K 5.4, sodium 136 Hemoglobin 7.4, wbc 10.7  Family Communication: Pt in room, family not at bedside  Disposition: Status is: Inpatient Remains inpatient appropriate because: Severity of illness  Planned  Discharge Destination:  Unclear at this time     Author: Nita Sells, MD 01/04/2022 10:50 AM  For on call review [email or web address].

## 2022-01-04 NOTE — Progress Notes (Signed)
Received patient in bed to unit.  Alert and oriented.  Informed consent signed and in chart.   Treatment initiated: 930a Treatment completed: 205p  Patient tolerated well.  Transported back to the room  Alert, without acute distress.  Hand-off given to patient's nurse.   Access used: [facility] Access issues: no  Total UF removed: 1500 Medication(s) given: ferric gluconate Post HD VS: 443/15,40,08, 98.1, 98% 2L N/C Post HD weight: no   [facility]

## 2022-01-05 ENCOUNTER — Encounter (HOSPITAL_COMMUNITY): Payer: Self-pay | Admitting: Anesthesiology

## 2022-01-05 LAB — CBC
HCT: 30.2 % — ABNORMAL LOW (ref 36.0–46.0)
Hemoglobin: 9.3 g/dL — ABNORMAL LOW (ref 12.0–15.0)
MCH: 30.3 pg (ref 26.0–34.0)
MCHC: 30.8 g/dL (ref 30.0–36.0)
MCV: 98.4 fL (ref 80.0–100.0)
Platelets: 314 10*3/uL (ref 150–400)
RBC: 3.07 MIL/uL — ABNORMAL LOW (ref 3.87–5.11)
RDW: 15 % (ref 11.5–15.5)
WBC: 10.8 10*3/uL — ABNORMAL HIGH (ref 4.0–10.5)
nRBC: 0 % (ref 0.0–0.2)

## 2022-01-05 LAB — BASIC METABOLIC PANEL
Anion gap: 13 (ref 5–15)
BUN: 21 mg/dL — ABNORMAL HIGH (ref 6–20)
CO2: 26 mmol/L (ref 22–32)
Calcium: 10.5 mg/dL — ABNORMAL HIGH (ref 8.9–10.3)
Chloride: 95 mmol/L — ABNORMAL LOW (ref 98–111)
Creatinine, Ser: 5.7 mg/dL — ABNORMAL HIGH (ref 0.44–1.00)
GFR, Estimated: 8 mL/min — ABNORMAL LOW (ref >60)
Glucose, Bld: 112 mg/dL — ABNORMAL HIGH (ref 70–99)
Potassium: 5 mmol/L (ref 3.5–5.1)
Sodium: 134 mmol/L — ABNORMAL LOW (ref 135–145)

## 2022-01-05 MED ORDER — OXYCODONE HCL 5 MG PO TABS
5.0000 mg | ORAL_TABLET | Freq: Four times a day (QID) | ORAL | Status: DC | PRN
  Administered 2022-01-07: 10 mg via ORAL
  Administered 2022-01-07 – 2022-01-11 (×3): 5 mg via ORAL
  Administered 2022-01-11 – 2022-01-18 (×7): 10 mg via ORAL
  Filled 2022-01-05 (×4): qty 2
  Filled 2022-01-05: qty 1
  Filled 2022-01-05 (×4): qty 2
  Filled 2022-01-05: qty 1
  Filled 2022-01-05: qty 2
  Filled 2022-01-05: qty 1

## 2022-01-05 MED ORDER — BISACODYL 5 MG PO TBEC
5.0000 mg | DELAYED_RELEASE_TABLET | Freq: Every day | ORAL | Status: DC | PRN
  Administered 2022-01-05: 5 mg via ORAL
  Filled 2022-01-05: qty 1

## 2022-01-05 MED ORDER — BISACODYL 10 MG RE SUPP
10.0000 mg | Freq: Every day | RECTAL | Status: DC | PRN
  Administered 2022-01-12: 10 mg via RECTAL
  Filled 2022-01-05: qty 1

## 2022-01-05 MED ORDER — LACTULOSE 10 GM/15ML PO SOLN
20.0000 g | Freq: Two times a day (BID) | ORAL | Status: DC
  Administered 2022-01-05: 10 g via ORAL
  Filled 2022-01-05 (×3): qty 30

## 2022-01-05 MED ORDER — VANCOMYCIN HCL IN DEXTROSE 1-5 GM/200ML-% IV SOLN: 1000.0000 mg | INTRAVENOUS | Status: AC

## 2022-01-05 NOTE — Anesthesia Preprocedure Evaluation (Signed)
Anesthesia Evaluation    Reviewed: Allergy & Precautions, Patient's Chart, lab work & pertinent test results  History of Anesthesia Complications Negative for: history of anesthetic complications  Airway        Dental   Pulmonary neg pulmonary ROS,           Cardiovascular hypertension, + CAD, + Past MI, + Peripheral Vascular Disease and +CHF    TTE 09/10/21: Poor quality study, EF 50-55%, moderate LVH, grade II diastolic dysfunction, mildly elevated PASP, mild to moderate LAE, mild to moderate TR, mild AS   Neuro/Psych negative neurological ROS  negative psych ROS   GI/Hepatic Neg liver ROS, GERD  Medicated and Controlled,  Endo/Other  diabetes, Type 2Hypothyroidism   Renal/GU ESRFRenal disease  negative genitourinary   Musculoskeletal  (+) Arthritis ,   Abdominal   Peds  Hematology  (+) Blood dyscrasia (Hgb 9.3), anemia ,   Anesthesia Other Findings Day of surgery medications reviewed with patient.  Reproductive/Obstetrics negative OB ROS                             Anesthesia Physical Anesthesia Plan  ASA: 4  Anesthesia Plan: Regional and MAC   Post-op Pain Management: Tylenol PO (pre-op)*   Induction:   PONV Risk Score and Plan: 2 and Treatment may vary due to age or medical condition, Propofol infusion and Ondansetron  Airway Management Planned: Natural Airway and Simple Face Mask  Additional Equipment: None  Intra-op Plan:   Post-operative Plan:   Informed Consent:   Plan Discussed with:   Anesthesia Plan Comments:         Anesthesia Quick Evaluation

## 2022-01-05 NOTE — Progress Notes (Signed)
[facility] Progress Note   Subjective:  Seen in room. No CP/dyspnea. C/o abdominal discomfort, thinks may have some constipation going on. Advised to ask her RN for laxative.  Objective Vitals:   01/04/22 1716 01/04/22 2132 01/05/22 0544 01/05/22 0854  BP: (!) 129/43 (!) 127/54 (!) 123/41 (!) 145/47  Pulse: 77 74 79 78  Resp: 16 18 18 16   Temp: 98.4 F (36.9 C) 98.4 F (36.9 C) 98.5 F (36.9 C) 98.6 F (37 C)  TempSrc: Oral Oral Oral Oral  SpO2: 100% 100% 100% 100%  Weight:      Height:       Physical Exam General: Chronically ill appearing woman, NAD. Heart: RRR; no murmur Lungs: CTA anteriorly, no wheezing Abdomen: soft, obese Extremities: B AKA; no edema Dialysis Access: L TDC, non-tender  Additional Objective Labs: Basic Metabolic Panel: Recent Labs  Lab 12/31/21 0851 01/02/22 1356  NA 136 137  K 5.7* 5.4*  CL 98 97*  CO2 24 28  GLUCOSE 85 123*  BUN 45* 39*  CREATININE 10.09* 9.10*  CALCIUM 9.7 9.9  PHOS 7.8* 8.0*   Liver Function Tests: Recent Labs  Lab 12/31/21 0851 01/02/22 1356  ALBUMIN 2.5* 2.6*   CBC: Recent Labs  Lab 12/31/21 0851 01/02/22 1211  WBC 10.3 10.7*  HGB 7.5* 7.4*  HCT 24.0* 24.9*  MCV 98.8 100.8*  PLT 288 276   Medications:  ferric gluconate (FERRLECIT) IVPB Stopped (01/04/22 1429)   [START ON 01/06/2022] vancomycin      aspirin EC  81 mg Oral Daily   atorvastatin  10 mg Oral Daily   darbepoetin (ARANESP) injection - DIALYSIS  200 mcg Intravenous Q Mon-HD   docusate sodium  100 mg Oral BID   feeding supplement  1 Container Oral TID BM   gabapentin  100 mg Oral Q1200   gabapentin  200 mg Oral QHS   heparin  5,000 Units Subcutaneous Q8H   levothyroxine  100 mcg Oral Q0600   loratadine  10 mg Oral Daily   lubiprostone  8 mcg Oral Q Tue   And   lubiprostone  8 mcg Oral Q Sat   midodrine  10 mg Oral Q M,W,F-HD   pantoprazole  40 mg Oral Daily   sevelamer carbonate  3.2 g Oral TID WC    Dialysis  Orders: MWF - [place]  4hrs, BFR 400, DFR 600,  AVF, EDW 146kg, 3K/ 2Ca - last hep B labs 8/21 - Heparin 2500 units IV with HD - Hectorol 34mcg IV q HD, held as of 10/30/21   Assessment/Plan: ESRD: Typically MWF, off schedule this week (TTS). Sched for AVF surgery tomorrow (Monday) - will keep her on TTS schedule this week. TDC malfunction: Had required two [facility] exchanges and having issues again on 9/4, got overnight tPA dwell. IR thinks TDC is positional as no fibrin on tip at last exchange. Discussed the need for a long term solution, VVS consulted - plan was for RUE AVF 12/19/21 but was postponed - now set for 10/2. Heparin boluses restarted with dialysis  BP/volume: BP variable, was on low dose metoprolol and midodine -> metop discontinued on 9/1. Last hoyer weight was 118kg which is down significantly from OP EDW -> lower on discharge. BP dropping on HD but she does not seem to have any excess fluid, will lower UF goals Chronic respiratory failure with hypoxia on cannula oxygen at home: on 5L O2 at baseline, have been able to titrate down O2  to 2-3L Anemia of ESRD: Hgb 7.4 - continue high dose ESA q Monday while here. Last tsat 16% on 9/14 with ferritin 1287, will getting short course of IV Fe. Transfuse < 7. Secondary hyperparathyroidism: CorrCa high, VDRA on hold. Renvela increased further on 9/30. 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 Severe sacral decubitus wound: Per primary/wound care team. Disposition: Difficult situation. Prior to admit, patient required 6 staff members and 2 hoyer pads for transfer into HD chair as OP. Could not stay on dialysis for full treatment because of pain of multiple decubitus ulcers and PTAR unable to lift pt into ambulance for transport. SW has been unable to locate stretcher dialysis location so far. Her weight is much lower from admit, asked PT about trying to hoyer to recliner for HD -> apparently would need hoyer that  wraps around her legs and patient is only able to tolerate <1 hour sitting up at this point. Please see renal navigator note from 12/23/21  [name], PA-C 01/05/2022, 9:49 AM  [employer]

## 2022-01-05 NOTE — Progress Notes (Addendum)
VASCULAR SURGERY:  The patient is scheduled for a right AV fistula or AV graft tomorrow with Dr. Virl Cagey.  RENAL: The patient is scheduled for surgery tomorrow morning.  Please be sure that she is not scheduled for dialysis tomorrow morning.  Thank you.  ANEMIA: Her hemoglobin on 01/02/2022 was 7.4.  Her potassium was 5.4.  I have ordered follow-up labs for today.  She may need blood preoperatively.  I have written preop orders.  Gae Gallop, MD 7:06 AM

## 2022-01-05 NOTE — Progress Notes (Signed)
Patient constipated-no distress otherwsie Hasn'rt received donut to sit on yet.  Ovrall not hungry  --adding lactulose today and monitro effect Continue efforts at mobility  Verneita Griffes, MD [facility] 5:18 PM

## 2022-01-05 NOTE — Progress Notes (Signed)
Pt c/o severe constipation and is wanting something for it. RN messaged MD on call to make aware.   Courtney Gray Sierra Bissonette

## 2022-01-06 ENCOUNTER — Encounter (HOSPITAL_COMMUNITY)
Admission: EM | Disposition: A | Discharge status: Skilled Nursing Facility | Payer: Self-pay | Source: Ambulatory Visit | Attending: Internal Medicine

## 2022-01-06 DIAGNOSIS — N186 End stage renal disease: Secondary | ICD-10-CM | POA: Diagnosis not present

## 2022-01-06 LAB — BASIC METABOLIC PANEL
Anion gap: 10 (ref 5–15)
BUN: 31 mg/dL — ABNORMAL HIGH (ref 6–20)
CO2: 27 mmol/L (ref 22–32)
Calcium: 10.1 mg/dL (ref 8.9–10.3)
Chloride: 99 mmol/L (ref 98–111)
Creatinine, Ser: 7.53 mg/dL — ABNORMAL HIGH (ref 0.44–1.00)
GFR, Estimated: 6 mL/min — ABNORMAL LOW (ref >60)
Glucose, Bld: 100 mg/dL — ABNORMAL HIGH (ref 70–99)
Potassium: 5.8 mmol/L — ABNORMAL HIGH (ref 3.5–5.1)
Sodium: 136 mmol/L (ref 135–145)

## 2022-01-06 LAB — CBC
HCT: 27.8 % — ABNORMAL LOW (ref 36.0–46.0)
Hemoglobin: 8.4 g/dL — ABNORMAL LOW (ref 12.0–15.0)
MCH: 29.5 pg (ref 26.0–34.0)
MCHC: 30.2 g/dL (ref 30.0–36.0)
MCV: 97.5 fL (ref 80.0–100.0)
Platelets: 303 10*3/uL (ref 150–400)
RBC: 2.85 MIL/uL — ABNORMAL LOW (ref 3.87–5.11)
RDW: 14.9 % (ref 11.5–15.5)
WBC: 11.3 10*3/uL — ABNORMAL HIGH (ref 4.0–10.5)
nRBC: 0 % (ref 0.0–0.2)

## 2022-01-06 SURGERY — ARTERIOVENOUS (AV) FISTULA CREATION
Anesthesia: Monitor Anesthesia Care | Laterality: Right

## 2022-01-06 MED ORDER — LIDOCAINE-PRILOCAINE 2.5-2.5 % EX CREA: 1.0000 | TOPICAL_CREAM | CUTANEOUS | Status: DC | PRN

## 2022-01-06 MED ORDER — LIDOCAINE HCL (PF) 1 % IJ SOLN
INTRAMUSCULAR | Status: AC
  Filled 2022-01-06: qty 30

## 2022-01-06 MED ORDER — PAPAVERINE HCL 30 MG/ML IJ SOLN
INTRAMUSCULAR | Status: AC
  Filled 2022-01-06: qty 2

## 2022-01-06 MED ORDER — HEPARIN SODIUM (PORCINE) 1000 UNIT/ML DIALYSIS
20.0000 [IU]/kg | INTRAMUSCULAR | Status: DC | PRN
  Administered 2022-01-08: 2300 [IU] via INTRAVENOUS_CENTRAL
  Filled 2022-01-06 (×2): qty 3

## 2022-01-06 MED ORDER — LIDOCAINE HCL (PF) 1 % IJ SOLN: 5.0000 mL | INTRAMUSCULAR | Status: DC | PRN

## 2022-01-06 MED ORDER — HEPARIN 6000 UNIT IRRIGATION SOLUTION
Status: AC
  Filled 2022-01-06: qty 500

## 2022-01-06 MED ORDER — HEPARIN SODIUM (PORCINE) 1000 UNIT/ML DIALYSIS
1000.0000 [IU] | INTRAMUSCULAR | Status: DC | PRN
  Filled 2022-01-06: qty 1

## 2022-01-06 MED ORDER — ALTEPLASE 2 MG IJ SOLR: 2.0000 mg | Freq: Once | INTRAMUSCULAR | Status: DC | PRN

## 2022-01-06 MED ORDER — PENTAFLUOROPROP-TETRAFLUOROETH EX AERO: 1.0000 | INHALATION_SPRAY | CUTANEOUS | Status: DC | PRN

## 2022-01-06 NOTE — Progress Notes (Addendum)
Pt refused surgery this morning. Stated she didn't feel well enough to proceed.  Discussed with Dr. Donzetta Matters. Pt would be best served with continued TDC use at this time.   Broadus John MD

## 2022-01-06 NOTE — Progress Notes (Addendum)
[facility] Progress Note   Subjective:    Seen and examined patient at bedside. She reports not feeling well today causing to hold AVF placement today. Main complaints is frequent stools (she denies diarrhea). Noted Lactulose recently started for constipation. She is concerned the Amitiza is causing her ongoing stools. Plan to hold Lactulose for now. Plan for HD 10/3.  Objective Vitals:   01/05/22 1722 01/05/22 2117 01/06/22 0624 01/06/22 1237  BP: (!) 96/55 132/66 (!) 107/57 (!) 118/42  Pulse: 80 77 73 79  Resp: 16 18 18 16   Temp: 97.6 F (36.4 C) 97.9 F (36.6 C) 98.5 F (36.9 C) 98 F (36.7 C)  TempSrc: Oral Oral Oral Oral  SpO2: 100% 100% 100% 100%  Weight:      Height:       Physical Exam General: Chronically ill appearing woman, NAD. Heart: RRR; no murmur Lungs: CTA anteriorly, no wheezing Abdomen: soft, obese Extremities: B AKA; no edema Dialysis Access: L TDC, non-tender  Filed Weights   12/31/21 1306 01/02/22 1235 01/02/22 1720  Weight: 117 kg 117 kg 115 kg    Intake/Output Summary (Last 24 hours) at 01/06/2022 1246 Last data filed at 01/06/2022 6389 Gross per 24 hour  Intake 360 ml  Output 0 ml  Net 360 ml    Additional Objective Labs: Basic Metabolic Panel: Recent Labs  Lab 12/31/21 0851 01/02/22 1356 01/05/22 1833  NA 136 137 134*  K 5.7* 5.4* 5.0  CL 98 97* 95*  CO2 24 28 26   GLUCOSE 85 123* 112*  BUN 45* 39* 21*  CREATININE 10.09* 9.10* 5.70*  CALCIUM 9.7 9.9 10.5*  PHOS 7.8* 8.0*  --    Liver Function Tests: Recent Labs  Lab 12/31/21 0851 01/02/22 1356  ALBUMIN 2.5* 2.6*   No results for input(s): "LIPASE", "AMYLASE" in the last 168 hours. CBC: Recent Labs  Lab 12/31/21 0851 01/02/22 1211 01/05/22 1833  WBC 10.3 10.7* 10.8*  HGB 7.5* 7.4* 9.3*  HCT 24.0* 24.9* 30.2*  MCV 98.8 100.8* 98.4  PLT 288 276 314   Blood Culture    Component Value Date/Time   SDES BLOOD LEFT ANTECUBITAL 10/16/2021 1534    SPECREQUEST  10/16/2021 1534    BOTTLES DRAWN AEROBIC AND ANAEROBIC Blood Culture results may not be optimal due to an inadequate volume of blood received in culture bottles   CULT  10/16/2021 1534    NO GROWTH 5 DAYS Performed at [facility] [address] [place], [place] [address]    REPTSTATUS 10/21/2021 FINAL 10/16/2021 1534    Cardiac Enzymes: No results for input(s): "CKTOTAL", "CKMB", "CKMBINDEX", "TROPONINI" in the last 168 hours. CBG: No results for input(s): "GLUCAP" in the last 168 hours. Iron Studies: No results for input(s): "IRON", "TIBC", "TRANSFERRIN", "FERRITIN" in the last 72 hours. Lab Results  Component Value Date   INR 1.3 (H) 10/09/2021   INR 1.3 (H) 09/10/2021   INR 1.3 (H) 07/10/2021   Studies/Results: No results found.  Medications:  ferric gluconate (FERRLECIT) IVPB Stopped (01/04/22 1429)   vancomycin      aspirin EC  81 mg Oral Daily   atorvastatin  10 mg Oral Daily   darbepoetin (ARANESP) injection - DIALYSIS  200 mcg Intravenous Q Mon-HD   docusate sodium  100 mg Oral BID   feeding supplement  1 Container Oral TID BM   gabapentin  100 mg Oral Q1200   gabapentin  200 mg Oral QHS   heparin  5,000  Units Subcutaneous Q8H   lactulose  20 g Oral BID   levothyroxine  100 mcg Oral Q0600   loratadine  10 mg Oral Daily   lubiprostone  8 mcg Oral Q Tue   And   lubiprostone  8 mcg Oral Q Sat   midodrine  10 mg Oral Q M,W,F-HD   pantoprazole  40 mg Oral Daily   sevelamer carbonate  3.2 g Oral TID WC    Dialysis Orders: MWF - [facility]  4hrs, BFR 400, DFR 600,  AVF, EDW 146kg, 3K/ 2Ca - last hep B labs 8/21 - Heparin 2500 units IV with HD - Hectorol 26mcg IV q HD, held as of 10/30/21  Assessment/Plan: ESRD: Typically MWF, off schedule this week (TTS). Will keep her on TTS schedule this week (10/3). TDC malfunction: Had required two [facility] exchanges and having issues again on 9/4, got overnight tPA dwell. IR thinks TDC is  positional as no fibrin on tip at last exchange. Discussed the need for a long term solution, VVS consulted - plan was for RUE AVF 12/19/21 but was postponed. Unfortunately, R AVF placement was not done today d/t her not feeling well (c/o frequent stools). Stopping her Lactulose today in hopes she will feel better. Plan to re-schedule AVF placement to sometime this week. Heparin boluses restarted with dialysis  Frequent loose stools: Lactulose recently started for constipation, but she has been refusing this medication d/t now loose stools. She is concerned that Amitiza is causing this. Reached out to hospitalist today to see whether she can be switched to another agent. In the meantime, will hold Lactulose for now. Monitor symptoms. BP/volume: BP variable, was on low dose metoprolol and midodine -> metop discontinued on 9/1. Last hoyer weight was 118kg which is down significantly from OP EDW -> lower on discharge. BP dropping on HD but she does not seem to have any excess fluid, will lower UF goals. May need to switch Midodrine to daily if Bps do not improve. Chronic respiratory failure with hypoxia on cannula oxygen at home: on 5L O2 at baseline, have been able to titrate down O2 to 2-3L Anemia of ESRD: Hgb now 9.3 - continue high dose ESA q Monday while here. Last tsat 16% on 9/14 with ferritin 1287, will getting short course of IV Fe. Transfuse < 7. Secondary hyperparathyroidism: CorrCa high, VDRA on hold. Renvela increased further on 9/30. 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 Severe sacral decubitus wound: Per primary/wound care team. Disposition: Difficult situation. Prior to admit, patient required 6 staff members and 2 hoyer pads for transfer into HD chair as OP. Could not stay on dialysis for full treatment because of pain of multiple decubitus ulcers and PTAR unable to lift pt into ambulance for transport. SW has been unable to locate stretcher dialysis location so far.  Her weight is much lower from admit, asked PT about trying to hoyer to recliner for HD -> apparently would need hoyer that wraps around her legs and patient is only able to tolerate <1 hour sitting up at this point. Please see renal navigator note from 12/23/21  Tobie Poet, NP [facility] 01/06/2022,12:46 PM  LOS: 66 days

## 2022-01-06 NOTE — Progress Notes (Signed)
Progress Note   Patient: Courtney Gray TDH:[id number] DOB: [birth year] DOA: 11/01/2021     66 DOS: the patient was seen and examined on 01/06/2022   Brief hospital course: 51 year old F ESRD on HD TTS,  recent lengthy hospitalization for Bacteroides Thetaiotaomicron bacteremia, hepatic and splenic abscess --required amputation of third digit of left hand and underwent left AKA and had splenic drain as well as CT-guided drainage of liver abscess she had a line holiday on last admission on long-term IV antibiotics until 09/08/6801 diastolic CHF, PVD s/p bilateral AKA, IDDM-2, morbid obesity and  chronic sacral wound presenting with progressive shortness of breath in the setting of missed dialysis and admitted for hypertensive emergency  Per patient, she never missed her dialysis however according to outpatient HD records, it takes at least 4 staff members and [facility] lift for patient to be seated in the dialysis chair.    Symptoms improved with hemodialysis.  Patient underwent R UE venogram on 8/30.  Vascular surgery following for possible RUE HD access.    Patient is now waiting placement where she can get HD on stretcher that has been difficult to find.  Assessment and Plan: ESRD on HD -4 people and [place] lift outpatient HD -Per vascular, patient postponed R aVF placement/AVG ---to be performed 10/1 by VVS -Not able to find stretcher dialysis location.  Final plan on her HD unclear. TOC continues to follow -added a redistributioncushion and have asked that she be seated up at least 1-2 times a day to ensure that she develops tolerance of this -Cont with PT as tolerated   Chronic hypoxic respiratory failure: 3 L at baseline.  Now saturating at 100% on 2 L.  Reports saturation of 90. -Weaning off oxygen as tolerated -Currently remains on 2LNC    Acute on chronic diastolic CHF/volume overload/hypertensive emergency -Nephrology managing volume with HD -Imdur and metoprolol on hold due to  hypotension -Continue midodrine 10 mg Monday Wednesday Friday -Phos being managed by renal   Hypotension: Resolved.  -Continue midodrine as above   Anemia of CKD: H&H relatively stable. -Transfusion threshold criteria not met -IV iron per nephrology.  Last iron level 28 and saturation ratio 16-repeat ~dosing as per nephro   Controlled IDDM-2: A1c 5.3%.  Discontinued CBG monitoring and SSI Previously had been on glipizide which has been discontinued   Recent septic shock secondary to Bacteroides, bacteremia and liver, and splenic abscess-completed antibiotic course.   Leukocytosis: Seems chronic but improved. -Monitor intermittently   PAD s/p bilateral AKA -Continue aspirin and statin. -Requested RN to place redistribution pad under area of decub   Hyperlipidemia -Statin.    Hypomagnesemia/hypokalemia/hyponatremia/hypophosphatemia -Per nephrology   Irritable bowel syndrome with constipation. Intractable nausea and vomiting -Continue Amitiza,Colace, Zofran as needed--change most to PRN given increasing frequency of stools   Sacral decub Wound examined on 9/29 looks well-healed  Deep wound---will need continued WOC follow up Pain from this limits mobility and sitting  Morbid obesity Body mass index is 44.68 kg/m. Recommend diet/lifestyle modification       Subjective:   Increasing stools today-no cp.  Hasn't been sitting on cushion No cp Missed her Vascular appt to creat fistula and at this time it looks like we shoul just use [facility]  Physical Exam: Vitals:   01/05/22 1722 01/05/22 2117 01/06/22 0624 01/06/22 1237  BP: (!) 96/55 132/66 (!) 107/57 (!) 118/42  Pulse: 80 77 73 79  Resp: 16 18 18 16   Temp: 97.6 F (36.4 C) 97.9 F (36.6 C)  98.5 F (36.9 C) 98 F (36.7 C)  TempSrc: Oral Oral Oral Oral  SpO2: 100% 100% 100% 100%  Weight:      Height:       coherent black female thick neck Mallampati 4 S1-S2 no murmur Chest is clear Decubitus ulcer not  reviewed  Data Reviewed:  No labs today  Family Communication: Pt in room, family not at bedside  Disposition: Status is: Inpatient Remains inpatient appropriate because: Severity of illness  Planned Discharge Destination:  Unclear at this time     Author: Nita Sells, MD 01/06/2022 4:05 PM  For on call review [email or web address].

## 2022-01-07 DIAGNOSIS — N186 End stage renal disease: Secondary | ICD-10-CM | POA: Diagnosis not present

## 2022-01-07 MED ORDER — DARBEPOETIN ALFA 200 MCG/0.4ML IJ SOSY
200.0000 ug | PREFILLED_SYRINGE | INTRAMUSCULAR | Status: DC
  Administered 2022-01-08: 200 ug via INTRAVENOUS
  Filled 2022-01-07: qty 0.4

## 2022-01-07 NOTE — Progress Notes (Signed)
Physical Therapy Treatment Patient Details Name: Courtney Gray MRN: [id number] DOB: 07-18-1970 Today's Date: 01/07/2022   History of Present Illness Pt adm 7/28 with acute pulmonary edema. 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    PT Comments    Patient eager to get OOB and practice with wheelchair. Continue to use maxisky with bil amputee sling with 2 person assist to transfer. Utilized Roho cushion under right ischium and doubled/folded over blue air cushion under left ischium in bariatric wheelchair. Utilizing 2 gait belts to create a waist strap to minimize risk of falling forward out of wheelchair. Patient recalled and able to lock and unlock wheelchair brakes independently. Patient able to self-propel a total of 1200 ft with brief rest breaks every 200-300 ft. Patient lifted back to bed after ~45 minutes due to soreness/pain in rt ischial wound and to reduce risk of injury to wound. Will reassess goals next visit.    Recommendations for follow up therapy are one component of a multi-disciplinary discharge planning process, led by the attending physician.  Recommendations may be updated based on patient status, additional functional criteria and insurance authorization.  Follow Up Recommendations  Skilled nursing-short term rehab (<3 hours/day) Can patient physically be transported by private vehicle: No   Assistance Recommended at Discharge Frequent or constant Supervision/Assistance  Patient can return home with the following Two people to help with walking and/or transfers;Two people to help with bathing/dressing/bathroom;Assist for transportation;Assistance with cooking/housework;Help with stairs or ramp for entrance   Equipment Recommendations  Wheelchair (measurements PT);Wheelchair cushion (measurements PT);Other (comment);Hospital bed (Needs wide w/c with cushion and anti-tippers with  ?offset wheels to reduce risk of tipping backwards)    Recommendations for Other Services       Precautions / Restrictions Precautions Precautions: Fall Precaution Comments: bil AKA, 2L O2 at baseline, LUQ drain Restrictions Weight Bearing Restrictions: Yes RUE Weight Bearing: Weight bearing as tolerated LUE Weight Bearing: Weight bearing as tolerated RLE Weight Bearing: Non weight bearing LLE Weight Bearing: Non weight bearing     Mobility  Bed Mobility Overal bed mobility: Needs Assistance Bed Mobility: Rolling Rolling: Supervision         General bed mobility comments: using rails    Transfers Overall transfer level: Needs assistance Equipment used:  (maxisky) Transfers: Bed to chair/wheelchair/BSC             General transfer comment: lifted to and from wheelchair with [employer] and amputee pad and +2 assist Transfer via Lift Equipment: Maxisky (with amputee lift pad)  Ambulation/Gait               General Gait Details: unable   Theme park manager mobility: Yes Wheelchair propulsion: Both upper extremities Wheelchair parts: Independent Distance: 200 (consecutive; total 1200 with very few, brief rest breaks) Wheelchair Assistance Details (indicate cue type and reason): pt able to be positioned in wheelchair with Rojo cushion on R side with slight curl up the side of the wheelchair seat and folded over geomat on the L side to level the seat. pt report slightly painful on R ischial wound. but able to perform better propulsion in with neutral hip positioning  Modified Rankin (Stroke Patients Only)       Balance Overall balance assessment: Needs assistance Sitting-balance support: No upper extremity supported Sitting balance-[name] Scale: Good Sitting  balance - Comments: in wheelchair; pt requests chest strap (made with gait belts) due to fear of falling                                     Cognition Arousal/Alertness: Awake/alert Behavior During Therapy: WFL for tasks assessed/performed Overall Cognitive Status: Within Functional Limits for tasks assessed                                          Exercises      General Comments General comments (skin integrity, edema, etc.): VSS on 2L O2      Pertinent Vitals/Pain Pain Assessment Pain Assessment: Faces Faces Pain Scale: Hurts little more Pain Location: R ischial pressure wound Pain Descriptors / Indicators: Guarding, Sore, Moaning Pain Intervention(s): Limited activity within patient's tolerance, Monitored during session, Premedicated before session, Repositioned    Home Living                          Prior Function            PT Goals (current goals can now be found in the care plan section) Acute Rehab PT Goals Patient Stated Goal: Pt wants to get up into a w/c PT Goal Formulation: With patient Time For Goal Achievement: 01/09/22 Potential to Achieve Goals: Fair    Frequency    Min 2X/week      PT Plan Current plan remains appropriate;Frequency needs to be updated    Co-evaluation              AM-PAC PT "6 Clicks" Mobility   Outcome Measure  Help needed turning from your back to your side while in a flat bed without using bedrails?: A Little Help needed moving from lying on your back to sitting on the side of a flat bed without using bedrails?: A Lot Help needed moving to and from a bed to a chair (including a wheelchair)?: Total Help needed standing up from a chair using your arms (e.g., wheelchair or bedside chair)?: Total Help needed to walk in hospital room?: Total Help needed climbing 3-5 steps with a railing? : Total 6 Click Score: 9    End of Session Equipment Utilized During Treatment: Oxygen Activity Tolerance: Patient tolerated treatment well Patient left: with call bell/phone within reach;in bed Nurse Communication: Mobility  status PT Visit Diagnosis: Other abnormalities of gait and mobility (R26.89);Muscle weakness (generalized) (M62.81) Pain - Right/Left: Right Pain - part of body:  (ischial wound)     Time: 4503-8882 PT Time Calculation (min) (ACUTE ONLY): 64 min  Charges:  $Wheel Chair Management: 53-67 mins                      [place]  Office [phone]    [name] 01/07/2022, 12:56 PM

## 2022-01-07 NOTE — Progress Notes (Signed)
[facility] Progress Note   Subjective:    Seen and examined patient at bedside. She reports feeling better. Reports less frequency with loose stools (meds adjusted by primary). She reports working with PT therapy today and performed well. Plan for HD tonight.  Objective Vitals:   01/06/22 1648 01/06/22 2123 01/07/22 0653 01/07/22 0944  BP: (!) 130/52 103/79 (!) 130/51 110/69  Pulse: 80 81 78 77  Resp: 15 18 16 18   Temp: 98.2 F (36.8 C) 98.7 F (37.1 C) 98.2 F (36.8 C) 98.4 F (36.9 C)  TempSrc: Oral Oral Oral Oral  SpO2: 100% 100% 100% 100%  Weight:  119.3 kg    Height:       Physical Exam General: Chronically ill appearing woman, NAD. Heart: RRR; no murmur Lungs: CTA anteriorly, no wheezing Abdomen: soft, obese Extremities: B AKA; no edema Dialysis Access: L TDC, non-tender   Filed Weights   01/02/22 1235 01/02/22 1720 01/06/22 2123  Weight: 117 kg 115 kg 119.3 kg    Intake/Output Summary (Last 24 hours) at 01/07/2022 1403 Last data filed at 01/07/2022 0200 Gross per 24 hour  Intake 340 ml  Output 0 ml  Net 340 ml    Additional Objective Labs: Basic Metabolic Panel: Recent Labs  Lab 01/02/22 1356 01/05/22 1833 01/06/22 1755  NA 137 134* 136  K 5.4* 5.0 5.8*  CL 97* 95* 99  CO2 28 26 27   GLUCOSE 123* 112* 100*  BUN 39* 21* 31*  CREATININE 9.10* 5.70* 7.53*  CALCIUM 9.9 10.5* 10.1  PHOS 8.0*  --   --    Liver Function Tests: Recent Labs  Lab 01/02/22 1356  ALBUMIN 2.6*   No results for input(s): "LIPASE", "AMYLASE" in the last 168 hours. CBC: Recent Labs  Lab 01/02/22 1211 01/05/22 1833 01/06/22 1755  WBC 10.7* 10.8* 11.3*  HGB 7.4* 9.3* 8.4*  HCT 24.9* 30.2* 27.8*  MCV 100.8* 98.4 97.5  PLT 276 314 303   Blood Culture    Component Value Date/Time   SDES BLOOD LEFT ANTECUBITAL 10/16/2021 1534   SPECREQUEST  10/16/2021 1534    BOTTLES DRAWN AEROBIC AND ANAEROBIC Blood Culture results may not be optimal due to an  inadequate volume of blood received in culture bottles   CULT  10/16/2021 1534    NO GROWTH 5 DAYS Performed at [facility] [address] [place], [place] [address]    REPTSTATUS 10/21/2021 FINAL 10/16/2021 1534    Cardiac Enzymes: No results for input(s): "CKTOTAL", "CKMB", "CKMBINDEX", "TROPONINI" in the last 168 hours. CBG: No results for input(s): "GLUCAP" in the last 168 hours. Iron Studies: No results for input(s): "IRON", "TIBC", "TRANSFERRIN", "FERRITIN" in the last 72 hours. Lab Results  Component Value Date   INR 1.3 (H) 10/09/2021   INR 1.3 (H) 09/10/2021   INR 1.3 (H) 07/10/2021   Studies/Results: No results found.  Medications:  ferric gluconate (FERRLECIT) IVPB Stopped (01/04/22 1429)    aspirin EC  81 mg Oral Daily   atorvastatin  10 mg Oral Daily   darbepoetin (ARANESP) injection - DIALYSIS  200 mcg Intravenous Q Tue-HD   feeding supplement  1 Container Oral TID BM   gabapentin  100 mg Oral Q1200   gabapentin  200 mg Oral QHS   heparin  5,000 Units Subcutaneous Q8H   levothyroxine  100 mcg Oral Q0600   loratadine  10 mg Oral Daily   lubiprostone  8 mcg Oral Q Tue   And  lubiprostone  8 mcg Oral Q Sat   midodrine  10 mg Oral Q M,W,F-HD   pantoprazole  40 mg Oral Daily   sevelamer carbonate  3.2 g Oral TID WC    Dialysis Orders: MWF - [facility]  4hrs, BFR 400, DFR 600,  AVF, EDW 146kg, 3K/ 2Ca - last hep B labs 8/21 - Heparin 2500 units IV with HD - Hectorol 100mcg IV q HD, held as of 10/30/21  Assessment/Plan: ESRD: Typically MWF, off schedule this week. Will keep her on TTS schedule this week (10/3). K+ yesterday 5.8. Checking labs after HD tonight. TDC malfunction: Had required two [facility] exchanges and having issues again on 9/4, got overnight tPA dwell. IR thinks TDC is positional as no fibrin on tip at last exchange. Discussed the need for a long term solution, VVS consulted - plan was for RUE AVF 12/19/21 but was postponed.  Unfortunately, R AVF placement was again postponed yesterday d/t her not feeling well (c/o frequent stools). Discussed with Hospitalist, meds adjusted, and now she feels better. Plan to re-schedule AVF placement to sometime this week. Heparin boluses restarted with dialysis  Frequent loose stools: Lactulose recently started for constipation, but she has been refusing this medication d/t now loose stools. She is concerned that Amitiza is causing this. Reached out to hospitalist today to see whether she can be switched to another agent. In the meantime, will hold Lactulose for now. Monitor symptoms. BP/volume: BP variable, was on low dose metoprolol and midodine -> metop discontinued on 9/1. Last hoyer weight was 118kg which is down significantly from OP EDW -> lower on discharge. BP dropping on HD but she does not seem to have any excess fluid, will lower UF goals. May need to switch Midodrine to daily if Bps do not improve. Chronic respiratory failure with hypoxia on cannula oxygen at home: on 5L O2 at baseline, have been able to titrate down O2 to 2-3L Anemia of ESRD: Hgb now 8.4 - continue high dose ESA q Monday while here. Last tsat 16% on 9/14 with ferritin 1287, will getting short course of IV Fe. Transfuse < 7. Secondary hyperparathyroidism: CorrCa high, VDRA on hold. Renvela increased further on 9/30. 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 Severe sacral decubitus wound: Per primary/wound care team. Disposition: Difficult situation. Prior to admit, patient required 6 staff members and 2 hoyer pads for transfer into HD chair as OP. Could not stay on dialysis for full treatment because of pain of multiple decubitus ulcers and PTAR unable to lift pt into ambulance for transport. SW has been unable to locate stretcher dialysis location so far. Her weight is much lower from admit, asked PT about trying to hoyer to recliner for HD -> apparently would need hoyer that wraps around her  legs and patient is only able to tolerate <1 hour sitting up at this point. Please see renal navigator note from 12/23/21  Tobie Poet, NP [facility] 01/07/2022,2:03 PM  LOS: 67 days

## 2022-01-07 NOTE — Progress Notes (Signed)
Progress Note   Patient: Courtney Gray ESP:[id number] DOB: Aug 22, 1970 DOA: 11/01/2021     67 DOS: the patient was seen and examined on 01/07/2022   Brief hospital course: 51 year old F ESRD on HD TTS,  recent lengthy hospitalization for Bacteroides Thetaiotaomicron bacteremia, hepatic and splenic abscess --required amputation of third digit of left hand and underwent left AKA and had splenic drain as well as CT-guided drainage of liver abscess she had a line holiday on last admission on long-term IV antibiotics until 09/07/3352 diastolic CHF, PVD s/p bilateral AKA, IDDM-2, morbid obesity and  chronic sacral wound presenting with progressive shortness of breath in the setting of missed dialysis and admitted for hypertensive emergency  Per patient, she never missed her dialysis however according to outpatient HD records, it takes at least 4 staff members and [facility] lift for patient to be seated in the dialysis chair.    Symptoms improved with hemodialysis.  Patient underwent R UE venogram on 8/30.  Vascular surgery following for possible RUE HD access.    Patient is now waiting placement where she can get HD on stretcher that has been difficult to find.  Assessment and Plan: ESRD on HD -4 people and Becker lift outpatient HD -Per vascular, patient postponed R aVF placement/AVG ---to be performed 10/1 by VVS -Not able to find stretcher dialysis location.  Final plan on her HD unclear. TOC continues to follow -added a redistribution cushion and have asked that she be seated up at least 1-2 times a day to ensure that she develops tolerance of this--she has made some efforts sat up in [facility] with maxi-move 10/3 and sat for 45 min--patient encouraged to continue to work at this with therapy -Cont with PT as tolerated   Chronic hypoxic respiratory failure: 3 L at baseline.  Now saturating at 100% on 2 L.  Reports saturation of 90. -Weaning off oxygen as tolerated -Currently remains on 2LNC    Acute  on chronic diastolic CHF/volume overload/hypertensive emergency -Nephrology managing volume with HD -Imdur and metoprolol on hold due to hypotension -Continue midodrine 10 mg Monday Wednesday Friday -Phos being managed by renal--check in am   Hypotension: Resolved.  -Continue midodrine as above   Anemia of CKD: H&H relatively stable. -Transfusion threshold criteria not met -IV iron per nephrology.  Last iron level 28 and saturation ratio 16-repeat ~should recheck on 10/8   Controlled IDDM-2: A1c 5.3%.  Discontinued CBG monitoring and SSI Previously had been on glipizide which has been discontinued   Recent septic shock secondary to Bacteroides, bacteremia and liver, and splenic abscess-completed antibiotic course.   Leukocytosis: Seems chronic but improved. -Monitor intermittently   PAD s/p bilateral AKA -Continue aspirin and statin. -Requested RN to place redistribution pad under area of decub   Hyperlipidemia -Statin.    Hypomagnesemia/hypokalemia/hyponatremia/hypophosphatemia -Per nephrology   Irritable bowel syndrome with constipation. Intractable nausea and vomiting -Continue Amitiza,Colace, Zofran as needed--change most to PRN given increasing frequency of stools   Sacral decub Wound examined on 9/29 looks well-healed  Deep wound---will need continued WOC follow up Pain from this limits mobility and sitting  Morbid obesity Body mass index is 44.68 kg/m. Recommend diet/lifestyle modification       Subjective:   Less stool-sat up as per PN. NO fever chills No cp Overall improved  Physical Exam: Vitals:   01/06/22 1648 01/06/22 2123 01/07/22 0653 01/07/22 0944  BP: (!) 130/52 103/79 (!) 130/51 110/69  Pulse: 80 81 78 77  Resp: _0 18  Temp: 98.2 F (36.8 C) 98.7 F (37.1 C) 98.2 F (36.8 C) 98.4 F (36.9 C)  TempSrc: Oral Oral Oral Oral  SpO2: 100% 100% 100% 100%  Weight:  119.3 kg    Height:       black female thick neck Mallampati  4 S1-S2 no murmur Chest is clear Decubitus ulcer not reviewed today  Data Reviewed:  Labs from 10/2--WBC 11.3, Hb 8.4--K 5.8, bun/cre 31/7.53  Family Communication: Pt in room, family not at bedside  Disposition: Status is: Inpatient Remains inpatient appropriate because: Severity of illness  Planned Discharge Destination:  Unclear at this time     Author: Nita Sells, MD 01/07/2022 2:57 PM  For on call review [email or web address].

## 2022-01-08 DIAGNOSIS — R7881 Bacteremia: Secondary | ICD-10-CM | POA: Diagnosis not present

## 2022-01-08 DIAGNOSIS — S68113A Complete traumatic metacarpophalangeal amputation of left middle finger, initial encounter: Secondary | ICD-10-CM | POA: Diagnosis not present

## 2022-01-08 DIAGNOSIS — I5033 Acute on chronic diastolic (congestive) heart failure: Secondary | ICD-10-CM | POA: Diagnosis not present

## 2022-01-08 DIAGNOSIS — N186 End stage renal disease: Secondary | ICD-10-CM | POA: Diagnosis not present

## 2022-01-08 LAB — CBC WITH DIFFERENTIAL/PLATELET
Abs Immature Granulocytes: 0.04 10*3/uL (ref 0.00–0.07)
Basophils Absolute: 0.1 10*3/uL (ref 0.0–0.1)
Basophils Relative: 1 %
Eosinophils Absolute: 0.7 10*3/uL — ABNORMAL HIGH (ref 0.0–0.5)
Eosinophils Relative: 7 %
HCT: 27.7 % — ABNORMAL LOW (ref 36.0–46.0)
Hemoglobin: 8.5 g/dL — ABNORMAL LOW (ref 12.0–15.0)
Immature Granulocytes: 0 %
Lymphocytes Relative: 14 %
Lymphs Abs: 1.6 10*3/uL (ref 0.7–4.0)
MCH: 29.9 pg (ref 26.0–34.0)
MCHC: 30.7 g/dL (ref 30.0–36.0)
MCV: 97.5 fL (ref 80.0–100.0)
Monocytes Absolute: 1.3 10*3/uL — ABNORMAL HIGH (ref 0.1–1.0)
Monocytes Relative: 12 %
Neutro Abs: 7.2 10*3/uL (ref 1.7–7.7)
Neutrophils Relative %: 66 %
Platelets: 289 10*3/uL (ref 150–400)
RBC: 2.84 MIL/uL — ABNORMAL LOW (ref 3.87–5.11)
RDW: 14.9 % (ref 11.5–15.5)
WBC: 10.9 10*3/uL — ABNORMAL HIGH (ref 4.0–10.5)
nRBC: 0 % (ref 0.0–0.2)

## 2022-01-08 LAB — RENAL FUNCTION PANEL
Albumin: 2.7 g/dL — ABNORMAL LOW (ref 3.5–5.0)
Anion gap: 13 (ref 5–15)
BUN: 17 mg/dL (ref 6–20)
CO2: 28 mmol/L (ref 22–32)
Calcium: 9.4 mg/dL (ref 8.9–10.3)
Chloride: 95 mmol/L — ABNORMAL LOW (ref 98–111)
Creatinine, Ser: 4.82 mg/dL — ABNORMAL HIGH (ref 0.44–1.00)
GFR, Estimated: 10 mL/min — ABNORMAL LOW
Glucose, Bld: 85 mg/dL (ref 70–99)
Phosphorus: 4.3 mg/dL (ref 2.5–4.6)
Potassium: 4.5 mmol/L (ref 3.5–5.1)
Sodium: 136 mmol/L (ref 135–145)

## 2022-01-08 MED ORDER — SENNA 8.6 MG PO TABS
1.0000 | ORAL_TABLET | Freq: Every evening | ORAL | Status: DC | PRN
  Administered 2022-01-08 – 2022-01-09 (×2): 8.6 mg via ORAL
  Filled 2022-01-08 (×3): qty 1

## 2022-01-08 NOTE — Progress Notes (Signed)
[facility] Progress Note   Subjective:    Seen and examined patient at bedside. Received HD early today and noted net UF 1.3L. Plan for HD 10/6 to be placed back on routine schedule.  Objective Vitals:   01/08/22 0359 01/08/22 0400 01/08/22 0450 01/08/22 0846  BP: (!) 110/54  (!) 104/50 138/61  Pulse: 74  74 73  Resp: (!) 24  20 16   Temp: (P) 98.8 F (37.1 C)  98.6 F (37 C) 98.1 F (36.7 C)  TempSrc: (P) Oral  Oral Oral  SpO2: 96%  100% 100%  Weight:  (P) 115 kg    Height:       Physical Exam General: Chronically ill appearing woman, NAD. Heart: RRR; no murmur Lungs: CTA anteriorly, no wheezing Abdomen: soft, obese Extremities: B AKA; no edema Dialysis Access: L TDC, non-tender  Filed Weights   01/06/22 2123 01/08/22 0031 01/08/22 0400  Weight: 119.3 kg 116.1 kg (P) 115 kg    Intake/Output Summary (Last 24 hours) at 01/08/2022 1413 Last data filed at 01/08/2022 0900 Gross per 24 hour  Intake 2220 ml  Output 1300 ml  Net 920 ml    Additional Objective Labs: Basic Metabolic Panel: Recent Labs  Lab 01/02/22 1356 01/05/22 1833 01/06/22 1755 01/08/22 0738  NA 137 134* 136 136  K 5.4* 5.0 5.8* 4.5  CL 97* 95* 99 95*  CO2 28 26 27 28   GLUCOSE 123* 112* 100* 85  BUN 39* 21* 31* 17  CREATININE 9.10* 5.70* 7.53* 4.82*  CALCIUM 9.9 10.5* 10.1 9.4  PHOS 8.0*  --   --  4.3   Liver Function Tests: Recent Labs  Lab 01/02/22 1356 01/08/22 0738  ALBUMIN 2.6* 2.7*   No results for input(s): "LIPASE", "AMYLASE" in the last 168 hours. CBC: Recent Labs  Lab 01/02/22 1211 01/05/22 1833 01/06/22 1755 01/08/22 0738  WBC 10.7* 10.8* 11.3* 10.9*  NEUTROABS  --   --   --  7.2  HGB 7.4* 9.3* 8.4* 8.5*  HCT 24.9* 30.2* 27.8* 27.7*  MCV 100.8* 98.4 97.5 97.5  PLT 276 314 303 289   Blood Culture    Component Value Date/Time   SDES BLOOD LEFT ANTECUBITAL 10/16/2021 1534   SPECREQUEST  10/16/2021 1534    BOTTLES DRAWN AEROBIC AND ANAEROBIC Blood  Culture results may not be optimal due to an inadequate volume of blood received in culture bottles   CULT  10/16/2021 1534    NO GROWTH 5 DAYS Performed at [facility] [address], [place], [place] [address]    REPTSTATUS 10/21/2021 FINAL 10/16/2021 1534    Cardiac Enzymes: No results for input(s): "CKTOTAL", "CKMB", "CKMBINDEX", "TROPONINI" in the last 168 hours. CBG: No results for input(s): "GLUCAP" in the last 168 hours. Iron Studies: No results for input(s): "IRON", "TIBC", "TRANSFERRIN", "FERRITIN" in the last 72 hours. Lab Results  Component Value Date   INR 1.3 (H) 10/09/2021   INR 1.3 (H) 09/10/2021   INR 1.3 (H) 07/10/2021   Studies/Results: No results found.  Medications:   aspirin EC  81 mg Oral Daily   atorvastatin  10 mg Oral Daily   darbepoetin (ARANESP) injection - DIALYSIS  200 mcg Intravenous Q Tue-HD   feeding supplement  1 Container Oral TID BM   gabapentin  100 mg Oral Q1200   gabapentin  200 mg Oral QHS   heparin  5,000 Units Subcutaneous Q8H   levothyroxine  100 mcg Oral Q0600   loratadine  10 mg Oral Daily   lubiprostone  8 mcg Oral Q Tue   And   lubiprostone  8 mcg Oral Q Sat   midodrine  10 mg Oral Q M,W,F-HD   pantoprazole  40 mg Oral Daily   sevelamer carbonate  3.2 g Oral TID WC    Dialysis Orders: MWF - [facility]  4hrs, BFR 400, DFR 600,  AVF, EDW 146kg, 3K/ 2Ca - last hep B labs 8/21 - Heparin 2500 units IV with HD - Hectorol 42mcg IV q HD, held as of 10/30/21  Assessment/Plan: ESRD: Typically MWF. Received HD early this AM. Next HD 10/6 per routine schedule. K+ now 4.5. TDC malfunction: Had required two [facility] exchanges and having issues again on 9/4, got overnight tPA dwell. IR thinks TDC is positional as no fibrin on tip at last exchange. Discussed the need for a long term solution, VVS consulted - plan was for RUE AVF 12/19/21 but was postponed. Unfortunately, R AVF placement was again postponed 10/2 d/t her not  feeling well (c/o frequent stools). Discussed with Hospitalist, meds adjusted, and now she feels better. Plan to re-schedule AVF placement to sometime this week. Heparin boluses restarted with dialysis  Frequent loose stools: Lactulose recently started for constipation, but she has been refusing this medication d/t now loose stools. She is concerned that Amitiza is causing this. Reached out to hospitalist today to see whether she can be switched to another agent. In the meantime, will hold Lactulose for now. Monitor symptoms. BP/volume: BP variable, was on low dose metoprolol and midodine -> metop discontinued on 9/1. Last hoyer weight was 118kg which is down significantly from OP EDW -> lower on discharge. BP dropping on HD but she does not seem to have any excess fluid, will lower UF goals. May need to switch Midodrine to daily if Bps do not improve. Chronic respiratory failure with hypoxia on cannula oxygen at home: on 5L O2 at baseline, have been able to titrate down O2 to 2-3L Anemia of ESRD: Hgb now 8.4 - continue high dose ESA q Monday while here. Last tsat 16% on 9/14 with ferritin 1287, will getting short course of IV Fe. Transfuse < 7. Secondary hyperparathyroidism: CorrCa high, VDRA on hold. Renvela increased further on 9/30. 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 Severe sacral decubitus wound: Per primary/wound care team. Disposition: Difficult situation. Prior to admit, patient required 6 staff members and 2 hoyer pads for transfer into HD chair as OP. Could not stay on dialysis for full treatment because of pain of multiple decubitus ulcers and PTAR unable to lift pt into ambulance for transport. SW has been unable to locate stretcher dialysis location so far. Her weight is much lower from admit, asked PT about trying to hoyer to recliner for HD -> apparently would need hoyer that wraps around her legs and patient is only able to tolerate <1 hour sitting up at this  point. Please see renal navigator note from 12/23/21  Tobie Poet, NP [facility] 01/08/2022,2:13 PM  LOS: 68 days

## 2022-01-08 NOTE — Progress Notes (Signed)
During handoff round with nightshift RN, patient informed me that she had not slept all night due to being in dialysis and she did not want her meds or to be woken up until after 10am.

## 2022-01-08 NOTE — Progress Notes (Signed)
This RN went to patients room because patient had called and asked to be cleaned up from a BM.  This RN and NT went to room to clean patient and bathe her.  I also did her dressing change while in the room.  Patient was very argumentative with staff and did not want the would dressed per the wound care order.  I educated patient and she became agitated with me.  While giving her medications, she also became argumentative when mixing her Renvela with water as per her initial request.  Patient did not want any education.  I have informed the charge nurse Wyvonnia Lora and the nursing director Toula Moos, RN

## 2022-01-08 NOTE — Progress Notes (Signed)
[facility]                                                                              Courtney Gray, is a 51 y.o. female, DOB - 15-Dec-1970, ZDG:[id number] Admit date - 11/01/2021    Outpatient Primary MD for the patient is Monico Blitz, MD  LOS - 97  days  Chief Complaint  Patient presents with   Vascular Access Problem       Brief summary   Patient is a 51 year old female with ESRD on HD TTS, diastolic CHF, PVD status post bilateral AKA (right in May, left on 7/5), IDDM, morbid obesity, chronic sacral wounds admitted on 7/28, presented with progressive shortness of breath in the setting of missed the dialysis, admitted for hypertensive emergency.  According to outpatient HD records, takes at least 4 staff members and [facility] lift for patient to be seated in dialysis chair. Patient was found to be in fluid overload, underwent R UE venogram on 8/30.  Awaiting placement where she can get the HD on a stretcher, difficult placement  Assessment & Plan    Principal Problem:   End stage renal disease ([place]) on hemodialysis, MWF -HD per nephrology.  - Per outpatient HD records takes at least 4 staff members and avoid limit for patient to be seated in dialysis chair -Patient has been awaiting facility where she can get the HD on a stretcher.  TOC continues to follow. -Continue PT  Active Problems: TDC malfunction -Had required 2 TDC exchanges and issues again on 9/4.  Plan was for RUE AVF on 9/14 but was postponed, then postponed again on 10/3 due to her refusal secondary to not feeling well -Vascular surgery following, recommended continue TDC use at this time  Acute on chronic diastolic CHF/volume overload/hypertensive emergency -Continue volume management with HD -BP currently soft, continue to hold Imdur and metoprolol -Continue midodrine 10 mg Monday Wednesday Friday  Chronic respiratory failure with hypoxia -On O2 3 L at baseline -Wean O2 as  tolerated  Anemia of chronic kidney disease/ESRD -ESA per nephrology, continue to monitor H&H  Diabetes mellitus type 2, IDDM, controlled with underlying ESRD -Recently had been on glipizide which was discontinued -Hemoglobin A1c 5.3% -CBGs stable and controlled   Recent septic shock secondary to Bacteroides, bacteremia and liver, and splenic abscess -Completed antibiotic course    PAD status post bilateral AKA -Continue aspirin, statin  Hyperlipidemia -Continue statin   IBS with constipation, intractable nausea and vomiting -Diarrhea now improving, stool softeners now changed to as needed -No ongoing vomiting  Pressure injury documentation Right hip unstageable, POA Left lower flank, stage II  Obesity Estimated body mass index is 42.19 kg/m (pended) as calculated from the following:   Height as of this encounter: 5\' 5"  (1.651 m).   Weight as of this encounter: (P) 115 kg.  Code Status: Full code DVT Prophylaxis:  heparin injection 5,000 Units Start: 12/01/21 1400   Level of Care: Level of care: Med-Surg Family Communication: Updated patient'  Disposition Plan:      Remains inpatient appropriate: Difficult placement, needs dialysis unit outpatient with stretcher  Procedures:  HD  Consultants:  Nephrology Vascular surgery  Antimicrobials:   Anti-infectives (From admission, onward)    Start     Dose/Rate Route Frequency Ordered Stop   01/06/22 0600  vancomycin (VANCOCIN) IVPB 1000 mg/200 mL premix        1,000 mg 200 mL/hr over 60 Minutes Intravenous To Surgery 01/05/22 0709 01/07/22 0600   11/28/21 1215  vancomycin (VANCOCIN) IVPB 1000 mg/200 mL premix        1,000 mg 200 mL/hr over 60 Minutes Intravenous  Once 11/28/21 1117 11/28/21 1512   11/26/21 1000  vancomycin (VANCOCIN) IVPB 1000 mg/200 mL premix        1,000 mg 200 mL/hr over 60 Minutes Intravenous On call 11/25/21 1628 11/26/21 1740   11/04/21 1800  ceFEPIme (MAXIPIME) 2 g in sodium chloride 0.9  % 100 mL IVPB        2 g 200 mL/hr over 30 Minutes Intravenous Every M-W-F (1800) 11/03/21 0952 11/08/21 2359   11/04/21 1200  ceFEPIme (MAXIPIME) 2 g in sodium chloride 0.9 % 100 mL IVPB  Status:  Discontinued        2 g 200 mL/hr over 30 Minutes Intravenous Every M-W-F (Hemodialysis) 11/02/21 0941 11/03/21 0952   11/02/21 1800  ceFEPIme (MAXIPIME) 2 g in sodium chloride 0.9 % 100 mL IVPB        2 g 200 mL/hr over 30 Minutes Intravenous  Once 11/02/21 0941 11/02/21 1812   11/02/21 1200  ceFEPIme (MAXIPIME) 2 g in sodium chloride 0.9 % 100 mL IVPB  Status:  Discontinued        2 g 200 mL/hr over 30 Minutes Intravenous Every T-Th-Sa (Hemodialysis) 11/01/21 1934 11/02/21 0941   11/01/21 2200  metroNIDAZOLE (FLAGYL) tablet 500 mg        500 mg Oral Every 12 hours 11/01/21 1827 11/08/21 2225   11/01/21 1945  ceFEPIme (MAXIPIME) 1 g in sodium chloride 0.9 % 100 mL IVPB        1 g 200 mL/hr over 30 Minutes Intravenous  Once 11/01/21 1933 11/01/21 2032   11/01/21 1730  ceFEPIme (MAXIPIME) 1 g in sodium chloride 0.9 % 100 mL IVPB  Status:  Discontinued        1 g 200 mL/hr over 30 Minutes Intravenous Every 24 hours 11/01/21 1720 11/01/21 1933          Medications  aspirin EC  81 mg Oral Daily   atorvastatin  10 mg Oral Daily   darbepoetin (ARANESP) injection - DIALYSIS  200 mcg Intravenous Q Tue-HD   feeding supplement  1 Container Oral TID BM   gabapentin  100 mg Oral Q1200   gabapentin  200 mg Oral QHS   heparin  5,000 Units Subcutaneous Q8H   levothyroxine  100 mcg Oral Q0600   loratadine  10 mg Oral Daily   lubiprostone  8 mcg Oral Q Tue   And   lubiprostone  8 mcg Oral Q Sat   midodrine  10 mg Oral Q M,W,F-HD   pantoprazole  40 mg Oral Daily   sevelamer carbonate  3.2 g Oral TID WC      Subjective:   Courtney Gray was seen and examined today.  States very sleepy, did not sleep well last night, had return from HD at 5 AM.  Otherwise no complaints, no nausea vomiting,  acute diarrhea or chest pain.  No fevers  Objective:   Vitals:   01/08/22 0359 01/08/22 0400 01/08/22 0450 01/08/22 0846  BP: (!) 110/54  Marland Kitchen)  104/50 138/61  Pulse: 74  74 73  Resp: (!) 24  20 16   Temp: (P) 98.8 F (37.1 C)  98.6 F (37 C) 98.1 F (36.7 C)  TempSrc: (P) Oral  Oral Oral  SpO2: 96%  100% 100%  Weight:  (P) 115 kg    Height:        Intake/Output Summary (Last 24 hours) at 01/08/2022 1323 Last data filed at 01/08/2022 0900 Gross per 24 hour  Intake 2220 ml  Output 1300 ml  Net 920 ml     Wt Readings from Last 3 Encounters:  01/08/22 (P) 115 kg  10/21/21 (!) 144 kg  09/03/21 (!) 150.6 kg     Exam General: Sleepy but easily arousable this morning Cardiovascular: S1 S2 auscultated,  RRR Respiratory: Clear to auscultation bilaterally anterioly Gastrointestinal: Soft, nontender, nondistended, + bowel sounds Ext: BL AKA Psych: Sleepy but easily arousable    Data Reviewed:  I have personally reviewed following labs    CBC Lab Results  Component Value Date   WBC 10.9 (H) 01/08/2022   RBC 2.84 (L) 01/08/2022   HGB 8.5 (L) 01/08/2022   HCT 27.7 (L) 01/08/2022   MCV 97.5 01/08/2022   MCH 29.9 01/08/2022   PLT 289 01/08/2022   MCHC 30.7 01/08/2022   RDW 14.9 01/08/2022   LYMPHSABS 1.6 01/08/2022   MONOABS 1.3 (H) 01/08/2022   EOSABS 0.7 (H) 01/08/2022   BASOSABS 0.1 26/94/8546     Last metabolic panel Lab Results  Component Value Date   NA 136 01/08/2022   K 4.5 01/08/2022   CL 95 (L) 01/08/2022   CO2 28 01/08/2022   BUN 17 01/08/2022   CREATININE 4.82 (H) 01/08/2022   GLUCOSE 85 01/08/2022   GFRNONAA 10 (L) 01/08/2022   GFRAA 6 (L) 11/29/2019   CALCIUM 9.4 01/08/2022   PHOS 4.3 01/08/2022   PROT 6.9 11/06/2021   ALBUMIN 2.7 (L) 01/08/2022   BILITOT 0.9 11/06/2021   ALKPHOS 62 11/06/2021   AST 28 11/06/2021   ALT 7 11/06/2021   ANIONGAP 13 01/08/2022     Radiology Studies: I have personally reviewed the imaging studies  No  results found.     Estill Cotta M.D. [facility] 01/08/2022, 1:23 PM  Available via Epic secure chat 7am-7pm After 7 pm, please refer to night coverage provider listed on amion.

## 2022-01-09 DIAGNOSIS — S68113A Complete traumatic metacarpophalangeal amputation of left middle finger, initial encounter: Secondary | ICD-10-CM | POA: Diagnosis not present

## 2022-01-09 DIAGNOSIS — N183 Chronic kidney disease, stage 3 unspecified: Secondary | ICD-10-CM

## 2022-01-09 DIAGNOSIS — N186 End stage renal disease: Secondary | ICD-10-CM | POA: Diagnosis not present

## 2022-01-09 DIAGNOSIS — I5033 Acute on chronic diastolic (congestive) heart failure: Secondary | ICD-10-CM | POA: Diagnosis not present

## 2022-01-09 MED ORDER — DARBEPOETIN ALFA 200 MCG/0.4ML IJ SOSY
200.0000 ug | PREFILLED_SYRINGE | INTRAMUSCULAR | Status: DC
  Administered 2022-01-15: 200 ug via INTRAVENOUS
  Filled 2022-01-09: qty 0.4

## 2022-01-09 NOTE — Progress Notes (Signed)
[facility] Progress Note   Subjective:    Seen patient in hallway working with PT. No acute complaints/concerns. Plan for HD 10/6.  Objective Vitals:   01/08/22 1610 01/08/22 2142 01/09/22 0552 01/09/22 0909  BP: (!) 122/50 (!) 134/57 (!) 131/58 (!) 143/60  Pulse: 71 82  71  Resp: 16 18 18 17   Temp: 99.1 F (37.3 C) 98.5 F (36.9 C) 97.8 F (36.6 C) 97.9 F (36.6 C)  TempSrc: Oral Oral Oral Oral  SpO2: 100% 100% 100% 100%  Weight:      Height:       Physical Exam General: Chronically ill appearing woman, NAD. Heart: unable to assess (patient working in hallway with PT) Lungs: unable to assess (patient working in hallway with PT) Abdomen: unable to assess (patient working in hallway with PT) Extremities: B AKA; no edema Dialysis Access: L TDC, non-tender   Filed Weights   01/06/22 2123 01/08/22 0031 01/08/22 0400  Weight: 119.3 kg 116.1 kg (P) 115 kg    Intake/Output Summary (Last 24 hours) at 01/09/2022 1522 Last data filed at 01/09/2022 1300 Gross per 24 hour  Intake 577 ml  Output --  Net 577 ml    Additional Objective Labs: Basic Metabolic Panel: Recent Labs  Lab 01/05/22 1833 01/06/22 1755 01/08/22 0738  NA 134* 136 136  K 5.0 5.8* 4.5  CL 95* 99 95*  CO2 26 27 28   GLUCOSE 112* 100* 85  BUN 21* 31* 17  CREATININE 5.70* 7.53* 4.82*  CALCIUM 10.5* 10.1 9.4  PHOS  --   --  4.3   Liver Function Tests: Recent Labs  Lab 01/08/22 0738  ALBUMIN 2.7*   No results for input(s): "LIPASE", "AMYLASE" in the last 168 hours. CBC: Recent Labs  Lab 01/05/22 1833 01/06/22 1755 01/08/22 0738  WBC 10.8* 11.3* 10.9*  NEUTROABS  --   --  7.2  HGB 9.3* 8.4* 8.5*  HCT 30.2* 27.8* 27.7*  MCV 98.4 97.5 97.5  PLT 314 303 289   Blood Culture    Component Value Date/Time   SDES BLOOD LEFT ANTECUBITAL 10/16/2021 1534   SPECREQUEST  10/16/2021 1534    BOTTLES DRAWN AEROBIC AND ANAEROBIC Blood Culture results may not be optimal due to an  inadequate volume of blood received in culture bottles   CULT  10/16/2021 1534    NO GROWTH 5 DAYS Performed at [facility], [place] [address] [place], [place] [address]    REPTSTATUS 10/21/2021 FINAL 10/16/2021 1534    Cardiac Enzymes: No results for input(s): "CKTOTAL", "CKMB", "CKMBINDEX", "TROPONINI" in the last 168 hours. CBG: No results for input(s): "GLUCAP" in the last 168 hours. Iron Studies: No results for input(s): "IRON", "TIBC", "TRANSFERRIN", "FERRITIN" in the last 72 hours. Lab Results  Component Value Date   INR 1.3 (H) 10/09/2021   INR 1.3 (H) 09/10/2021   INR 1.3 (H) 07/10/2021   Studies/Results: No results found.  Medications:   aspirin EC  81 mg Oral Daily   atorvastatin  10 mg Oral Daily   [START ON 01/15/2022] darbepoetin (ARANESP) injection - DIALYSIS  200 mcg Intravenous Q Wed-HD   feeding supplement  1 Container Oral TID BM   gabapentin  100 mg Oral Q1200   gabapentin  200 mg Oral QHS   heparin  5,000 Units Subcutaneous Q8H   levothyroxine  100 mcg Oral Q0600   loratadine  10 mg Oral Daily   lubiprostone  8 mcg Oral Q Tue  And   lubiprostone  8 mcg Oral Q Sat   midodrine  10 mg Oral Q M,W,F-HD   pantoprazole  40 mg Oral Daily   sevelamer carbonate  3.2 g Oral TID WC    Dialysis Orders: MWF - [facility]  4hrs, BFR 400, DFR 600,  AVF, EDW 146kg, 3K/ 2Ca - last hep B labs 8/21 - Heparin 2500 units IV with HD - Hectorol 28mcg IV q HD, held as of 10/30/21   Assessment/Plan: ESRD: Typically MWF. Received HD early this AM 10/4-noted net UF 1.3L. Next HD 10/6 per routine schedule. K+ now 4.5. TDC malfunction: Had required two [facility] exchanges and having issues again on 9/4, got overnight tPA dwell. IR thinks TDC is positional as no fibrin on tip at last exchange. Discussed the need for a long term solution, [facility] consulted - plan was for RUE AVF 12/19/21 but was postponed. Unfortunately, R AVF placement was again postponed 10/2 d/t her not  feeling well (c/o frequent stools). Discussed with Hospitalist, meds adjusted, and now she feels better. Will need to re-schedule AVF placement with [facility]. Heparin boluses restarted with dialysis  Frequent loose stools: Lactulose recently started for constipation, but she has been refusing this medication d/t now loose stools. She is concerned that Amitiza is causing this. Reached out to hospitalist today to see whether she can be switched to another agent. In the meantime, will hold Lactulose for now. Monitor symptoms. BP/volume: BP variable, was on low dose metoprolol and midodine -> metop discontinued on 9/1. Last hoyer weight was 118kg which is down significantly from OP EDW -> lower on discharge. Bps improving. Will lower UF goals. Continue Midodrine with HD. Chronic respiratory failure with hypoxia on cannula oxygen at home: on 5L O2 at baseline, have been able to titrate down O2 to 2-3L Anemia of ESRD: Hgb now 8.5 - continue high dose ESA q Monday while here. Last tsat 16% on 9/14 with ferritin 1287, will getting short course of IV Fe. Transfuse < 7. Secondary hyperparathyroidism: CorrCa high, VDRA on hold. Renvela increased further on 9/30. 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 Severe sacral decubitus wound: Per primary/wound care team. Disposition: Difficult situation. Prior to admit, patient required 6 staff members and 2 hoyer pads for transfer into HD chair as OP. Could not stay on dialysis for full treatment because of pain of multiple decubitus ulcers and PTAR unable to lift pt into ambulance for transport. SW has been unable to locate stretcher dialysis location so far. Her weight is much lower from admit, asked PT about trying to hoyer to recliner for HD -> apparently would need hoyer that wraps around her legs and patient is only able to tolerate <1 hour sitting up at this point. Please see renal navigator note from 12/23/21   Tobie Poet, NP [facility] 01/09/2022,3:22 PM  LOS: 69 days

## 2022-01-09 NOTE — TOC Progression Note (Signed)
Transition of Care [facility]) - Initial/Assessment Note    Patient Details  Name: Courtney Gray MRN: [id number] Date of Birth: 02/20/1971  Transition of Care [facility]) CM/SW Contact:    Milinda Antis, LCSWA Phone Number: 01/09/2022, 10:28 AM  Clinical Narrative:                 TOC continues to follow.  No updates on discharge at the time.    Expected Discharge Plan: Long Term Nursing Home Barriers to Discharge: No SNF bed, Transportation, Waiting for outpatient dialysis   Patient Goals and CMS Choice Patient states their goals for this hospitalization and ongoing recovery are:: To be at a facility close to her daughter      Expected Discharge Plan and Services Expected Discharge Plan: [place]       Living arrangements for the past 2 months: Single Family Home                                      Prior Living Arrangements/Services Living arrangements for the past 2 months: Single Family Home Lives with:: Adult Children Patient language and need for interpreter reviewed:: Yes        Need for Family Participation in Patient Care: Yes (Comment) Care giver support system in place?: No (comment)   Criminal Activity/Legal Involvement Pertinent to Current Situation/Hospitalization: No - Comment as needed  Activities of Daily Living Home Assistive Devices/Equipment: [employer] fast streamer ADL Screening (condition at time of admission) Patient's cognitive ability adequate to safely complete daily activities?: No Is the patient deaf or have difficulty hearing?: No Does the patient have difficulty seeing, even when wearing glasses/contacts?: No Does the patient have difficulty concentrating, remembering, or making decisions?: No Patient able to express need for assistance with ADLs?: Yes Does the patient have difficulty dressing or bathing?: Yes Independently performs ADLs?: No Does the patient have difficulty walking or climbing stairs?: Yes Weakness of Legs:  Both Weakness of Arms/Hands: Both  Permission Sought/Granted Permission sought to share information with : Other (comment) Permission granted to share information with : Yes, Verbal Permission Granted  Share Information with NAME: Courtney Gray (Daughter)   [phone]  Permission granted to share info w AGENCY: SNF        Emotional Assessment Appearance:: Appears older than stated age Attitude/Demeanor/Rapport: Engaged Affect (typically observed): Tearful/Crying, Accepting Orientation: : Oriented to Situation, Oriented to  Time, Oriented to Place, Oriented to Self Alcohol / Substance Use: Not Applicable Psych Involvement: No (comment)  Admission diagnosis:  End stage renal disease ([place]) [N18.6] Fluid overload [E87.70] Obesity with serious comorbidity, unspecified classification, unspecified obesity type [E66.9] Pressure injury of right buttock, stage 2 (HCC) [L89.312] Patient Active Problem List   Diagnosis Date Noted   Acute on chronic diastolic CHF (congestive heart failure) ([place]) 12/12/2021   PAD (peripheral artery disease) ([place]) 12/12/2021   Irritable bowel syndrome (IBS) 12/12/2021   Amputation of left middle finger 12/12/2021   GERD (gastroesophageal reflux disease) 12/12/2021   Hypertension    Type 2 diabetes mellitus with hyperlipidemia ([place])    Decubitus ulcer of sacral region, stage 2 ([place])    Class 3 obesity ([place])    Bacteremia    PVD (peripheral vascular disease) ([place])    Hepatic abscess    Pressure injury of skin 09/13/2021   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[place])    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   Lactic acidosis  09/10/2021   Mixed diabetic hyperlipidemia associated with type 2 diabetes mellitus ([place]) 09/10/2021   Coronary artery disease involving native coronary artery of native heart without angina pectoris 09/10/2021   Elevated troponin level not due myocardial infarction 09/10/2021   Hypotension 09/10/2021   Dry gangrene (HCC)    Pain  08/03/2021   Ischemic foot pain at rest 08/03/2021   Hypoglycemia 07/10/2021   Volume overload 07/09/2021   Leukocytosis 06/11/2021   Acute gout 06/11/2021   Hyperkalemia 05/21/2021   Hemorrhoids    Hyperlipidemia 05/18/2021   Rectal bleeding 05/17/2021   Morbid obesity with BMI of 60.0-69.9, adult ([place]) 05/17/2021   NSTEMI (non-ST elevated myocardial infarction) ([place]) 05/16/2021   Essential hypertension 09/01/2020   End stage renal disease ([place]) 09/01/2020   Chronic respiratory failure with hypoxia ([place]) 09/01/2020   Hypertensive emergency    Hypertensive urgency 11/28/2019   Uncontrolled type 2 diabetes mellitus with hypoglycemia, with long-term current use of insulin ([place]) 11/28/2019   Anemia due to chronic kidney disease 11/28/2019   PCP:  Monico Blitz, MD Pharmacy:   [place], [place] [address] [place] [address] Phone: [phone] Fax: [phone]  [facility] [address] [place] [address] Phone: [phone] Fax: [phone]  [place], [place] - [address] [place] [address] Phone: [phone] Fax: [phone]     Social Determinants of Health ([place]) Interventions    Readmission Risk Interventions    09/16/2021    4:36 PM 08/28/2021   11:50 AM 07/12/2021   12:38 PM  Readmission Risk Prevention Plan  Transportation Screening Complete Complete Complete  Medication Review (RN Care Manager)  Complete Complete  PCP or Specialist appointment within 3-5 days of discharge  Complete Complete  HRI or Home Care Consult Complete Complete Complete  SW Recovery Care/Counseling Consult Complete Complete Complete  Palliative Care Screening  Not Applicable Not Applicable  Skilled Nursing Facility Complete Not Applicable Complete

## 2022-01-09 NOTE — Progress Notes (Signed)
Physical Therapy Treatment Patient Details Name: Courtney Gray MRN: [id number] DOB: 08-18-1970 Today's Date: 01/09/2022   History of Present Illness Pt adm 7/28 with acute pulmonary edema. 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    PT Comments    Patient motivated to work with PT. Lifted via maxisky with amputee sling to wheelchair with Roho cushion under right ischium and folded blue air cushion under left ischium/thigh with her green blanket folded over top of both cushions for comfort (per pt request). Patient able to propel 180 ft non-stop before resting. Patient then began to experience pain in LEFT residual limb and was only able to propel another 75ft due to increasing pain. She reports it is due to limb "hanging down." Does not appear to extend over the end of the cushion. ? Need sliding board under cushion on left for increased support to LLE for incr comfort. Pt returned to room and back to bed.     Recommendations for follow up therapy are one component of a multi-disciplinary discharge planning process, led by the attending physician.  Recommendations may be updated based on patient status, additional functional criteria and insurance authorization.  Follow Up Recommendations  Skilled nursing-short term rehab (<3 hours/day) Can patient physically be transported by private vehicle: No   Assistance Recommended at Discharge Frequent or constant Supervision/Assistance  Patient can return home with the following Two people to help with walking and/or transfers;Two people to help with bathing/dressing/bathroom;Assist for transportation;Assistance with cooking/housework;Help with stairs or ramp for entrance   Equipment Recommendations  Wheelchair (measurements PT);Wheelchair cushion (measurements PT);Other (comment);Hospital bed (Needs wide w/c with cushion and anti-tippers with ?offset wheels to  reduce risk of tipping backwards)    Recommendations for Other Services       Precautions / Restrictions Precautions Precautions: Fall Precaution Comments: bil AKA, 2L O2 at baseline, LUQ drain Restrictions Weight Bearing Restrictions: Yes RUE Weight Bearing: Weight bearing as tolerated LUE Weight Bearing: Weight bearing as tolerated RLE Weight Bearing: Non weight bearing LLE Weight Bearing: Non weight bearing Other Position/Activity Restrictions: minimized pushing/pulling and weight through L hand, dressing now removed     Mobility  Bed Mobility Overal bed mobility: Needs Assistance Bed Mobility: Rolling Rolling: Supervision         General bed mobility comments: using rails    Transfers Overall transfer level: Needs assistance Equipment used:  (maxisky) Transfers: Bed to chair/wheelchair/BSC             General transfer comment: lifted to and from wheelchair with [employer] and amputee pad and +2 assist Transfer via Lift Equipment: Maxisky (with amputee lift pad)  Ambulation/Gait               General Gait Details: unable   Theme park manager propulsion: Both upper extremities Wheelchair parts: Independent (brakes) Distance: 180 (non-stop; after resting additional 95 ft, limited by) Wheelchair Assistance Details (indicate cue type and reason): pt able to be positioned in wheelchair with Rojo cushion on R side, folded over geomat on the L side, and pt's green blanket folded overtop to level the seat. pt report slightly painful on R ischial wound. but able to perform better propulsion in with neutral hip positioning  Modified Rankin (Stroke Patients Only)       Balance Overall balance assessment: Needs assistance Sitting-balance  support: No upper extremity supported Sitting balance-[name] Scale: Good Sitting balance - Comments: in wheelchair; pt requests chest strap (made with gait belts) due  to fear of falling                                    Cognition Arousal/Alertness: Awake/alert Behavior During Therapy: WFL for tasks assessed/performed Overall Cognitive Status: Within Functional Limits for tasks assessed                                          Exercises      General Comments General comments (skin integrity, edema, etc.): Pt reports she has not been doing OT's UE program because she cannot ever find her theraband (currently was located in windowsill)      Pertinent Vitals/Pain Pain Assessment Pain Assessment: 0-10 Pain Score: 9  Pain Location: left residual limb with sitting in wheelchair Pain Descriptors / Indicators: Guarding, Sore, Moaning Pain Intervention(s): Limited activity within patient's tolerance, Monitored during session, Repositioned    Home Living                          Prior Function            PT Goals (current goals can now be found in the care plan section) Acute Rehab PT Goals Patient Stated Goal: Pt wants to get up into a w/c PT Goal Formulation: With patient Time For Goal Achievement: 01/23/22 Potential to Achieve Goals: Fair Progress towards PT goals: Progressing toward goals    Frequency    Min 2X/week      PT Plan Current plan remains appropriate    Co-evaluation              AM-PAC PT "6 Clicks" Mobility   Outcome Measure  Help needed turning from your back to your side while in a flat bed without using bedrails?: A Little Help needed moving from lying on your back to sitting on the side of a flat bed without using bedrails?: A Lot Help needed moving to and from a bed to a chair (including a wheelchair)?: Total Help needed standing up from a chair using your arms (e.g., wheelchair or bedside chair)?: Total Help needed to walk in hospital room?: Total Help needed climbing 3-5 steps with a railing? : Total 6 Click Score: 9    End of Session Equipment Utilized  During Treatment: Oxygen Activity Tolerance: Patient limited by pain Patient left: with call bell/phone within reach;in bed   PT Visit Diagnosis: Other abnormalities of gait and mobility (R26.89);Muscle weakness (generalized) (M62.81) Pain - Right/Left: Left Pain - part of body: Leg (ischial wound)     Time: 9937-1696 PT Time Calculation (min) (ACUTE ONLY): 67 min  Charges:  $Therapeutic Activity: 23-37 mins $Wheel Chair Management: 23-37 mins                      Arby Barrette, PT Acute Rehabilitation Services  Office [phone]    [name] 01/09/2022, 4:12 PM

## 2022-01-09 NOTE — Progress Notes (Signed)
[facility]                                                                              Courtney Gray, is a 51 y.o. female, DOB - [birth year], [id number] Admit date - 11/01/2021    Outpatient Primary MD for the patient is Monico Blitz, MD  LOS - 54  days  Chief Complaint  Patient presents with   Vascular Access Problem       Brief summary   Patient is a 51 year old female with ESRD on HD TTS, diastolic CHF, PVD status post bilateral AKA (right in May, left on 7/5), IDDM, morbid obesity, chronic sacral wounds admitted on 7/28, presented with progressive shortness of breath in the setting of missed the dialysis, admitted for hypertensive emergency.  According to outpatient HD records, takes at least 4 staff members and [facility] lift for patient to be seated in dialysis chair. Patient was found to be in fluid overload, underwent R UE venogram on 8/30.  Awaiting placement where she can get the HD on a stretcher, difficult placement  Assessment & Plan    Principal Problem:   End stage renal disease ([place]) on hemodialysis, MWF -HD per nephrology.  - Per outpatient HD records takes at least 4 staff members and avoid limit for patient to be seated in dialysis chair -Patient has been awaiting facility where she can get the HD on a stretcher.  TOC continues to follow. -Continue PT -No acute issues, awaiting SNF  Active Problems: TDC malfunction -Had required 2 TDC exchanges and issues again on 9/4.  Plan was for RUE AVF on 9/14 but was postponed, then postponed again on 10/3 due to her refusal secondary to not feeling well -Vascular surgery following, recommended continue TDC use at this time  Acute on chronic diastolic CHF/volume overload/hypertensive emergency -Continue volume management with HD -BP currently soft, continue to hold Imdur and metoprolol -Continue midodrine 10 mg Monday Wednesday Friday  Chronic respiratory failure with hypoxia -On O2 3 L at  baseline -Wean O2 as tolerated  Anemia of chronic kidney disease/ESRD -ESA per nephrology, continue to monitor H&H -Hemoglobin stable  Diabetes mellitus type 2, IDDM, controlled with underlying ESRD -Recently had been on glipizide which was discontinued -Hemoglobin A1c 5.3% -CBGs stable and controlled   Recent septic shock secondary to Bacteroides, bacteremia and liver, and splenic abscess -Completed antibiotic course    PAD status post bilateral AKA -Continue aspirin, statin  Hyperlipidemia -Continue statin   IBS with constipation, intractable nausea and vomiting -Diarrhea now improving, stool softeners now changed to as needed -No ongoing vomiting  Pressure injury documentation Right hip unstageable, POA Left lower flank, stage II  Obesity Estimated body mass index is 42.19 kg/m (pended) as calculated from the following:   Height as of this encounter: 5\' 5"  (1.651 m).   Weight as of this encounter: (P) 115 kg.  Code Status: Full code DVT Prophylaxis:  heparin injection 5,000 Units Start: 12/01/21 1400   Level of Care: Level of care: Med-Surg Family Communication: Updated patient  Disposition Plan:      Remains inpatient appropriate: Difficult placement, needs dialysis unit outpatient with  stretcher, TOC assisting  Procedures:  HD  Consultants:   Nephrology Vascular surgery  Antimicrobials:   Anti-infectives (From admission, onward)    Start     Dose/Rate Route Frequency Ordered Stop   01/06/22 0600  vancomycin (VANCOCIN) IVPB 1000 mg/200 mL premix        1,000 mg 200 mL/hr over 60 Minutes Intravenous To Surgery 01/05/22 0709 01/07/22 0600   11/28/21 1215  vancomycin (VANCOCIN) IVPB 1000 mg/200 mL premix        1,000 mg 200 mL/hr over 60 Minutes Intravenous  Once 11/28/21 1117 11/28/21 1512   11/26/21 1000  vancomycin (VANCOCIN) IVPB 1000 mg/200 mL premix        1,000 mg 200 mL/hr over 60 Minutes Intravenous On call 11/25/21 1628 11/26/21 1740    11/04/21 1800  ceFEPIme (MAXIPIME) 2 g in sodium chloride 0.9 % 100 mL IVPB        2 g 200 mL/hr over 30 Minutes Intravenous Every M-W-F (1800) 11/03/21 0952 11/08/21 2359   11/04/21 1200  ceFEPIme (MAXIPIME) 2 g in sodium chloride 0.9 % 100 mL IVPB  Status:  Discontinued        2 g 200 mL/hr over 30 Minutes Intravenous Every M-W-F (Hemodialysis) 11/02/21 0941 11/03/21 0952   11/02/21 1800  ceFEPIme (MAXIPIME) 2 g in sodium chloride 0.9 % 100 mL IVPB        2 g 200 mL/hr over 30 Minutes Intravenous  Once 11/02/21 0941 11/02/21 1812   11/02/21 1200  ceFEPIme (MAXIPIME) 2 g in sodium chloride 0.9 % 100 mL IVPB  Status:  Discontinued        2 g 200 mL/hr over 30 Minutes Intravenous Every T-Th-Sa (Hemodialysis) 11/01/21 1934 11/02/21 0941   11/01/21 2200  metroNIDAZOLE (FLAGYL) tablet 500 mg        500 mg Oral Every 12 hours 11/01/21 1827 11/08/21 2225   11/01/21 1945  ceFEPIme (MAXIPIME) 1 g in sodium chloride 0.9 % 100 mL IVPB        1 g 200 mL/hr over 30 Minutes Intravenous  Once 11/01/21 1933 11/01/21 2032   11/01/21 1730  ceFEPIme (MAXIPIME) 1 g in sodium chloride 0.9 % 100 mL IVPB  Status:  Discontinued        1 g 200 mL/hr over 30 Minutes Intravenous Every 24 hours 11/01/21 1720 11/01/21 1933          Medications  aspirin EC  81 mg Oral Daily   atorvastatin  10 mg Oral Daily   [START ON 01/15/2022] darbepoetin (ARANESP) injection - DIALYSIS  200 mcg Intravenous Q Wed-HD   feeding supplement  1 Container Oral TID BM   gabapentin  100 mg Oral Q1200   gabapentin  200 mg Oral QHS   heparin  5,000 Units Subcutaneous Q8H   levothyroxine  100 mcg Oral Q0600   loratadine  10 mg Oral Daily   lubiprostone  8 mcg Oral Q Tue   And   lubiprostone  8 mcg Oral Q Sat   midodrine  10 mg Oral Q M,W,F-HD   pantoprazole  40 mg Oral Daily   sevelamer carbonate  3.2 g Oral TID WC      Subjective:   Courtney Gray was seen and examined today.  No acute complaints, no nausea vomiting,  fevers.  Has mild abdominal discomfort.    Objective:   Vitals:   01/08/22 1610 01/08/22 2142 01/09/22 0552 01/09/22 0909  BP: (!) 122/50 (!) 134/57 (!) 131/58 Marland Kitchen)  143/60  Pulse: 71 82  71  Resp: 16 18 18 17   Temp: 99.1 F (37.3 C) 98.5 F (36.9 C) 97.8 F (36.6 C) 97.9 F (36.6 C)  TempSrc: Oral Oral Oral Oral  SpO2: 100% 100% 100% 100%  Weight:      Height:        Intake/Output Summary (Last 24 hours) at 01/09/2022 1307 Last data filed at 01/09/2022 0601 Gross per 24 hour  Intake 237 ml  Output --  Net 237 ml     Wt Readings from Last 3 Encounters:  01/08/22 (P) 115 kg  10/21/21 (!) 144 kg  09/03/21 (!) 150.6 kg    Physical Exam General: Alert and oriented x 3, NAD Cardiovascular: S1 S2 clear, RRR.  Respiratory: CTAB, no wheezing Gastrointestinal: Soft, mild epigastric TTP, nondistended, NBS Ext: bilateral AKA Psych: Normal affect and demeanor, alert and oriented x3    Data Reviewed:  I have personally reviewed following labs    CBC Lab Results  Component Value Date   WBC 10.9 (H) 01/08/2022   RBC 2.84 (L) 01/08/2022   HGB 8.5 (L) 01/08/2022   HCT 27.7 (L) 01/08/2022   MCV 97.5 01/08/2022   MCH 29.9 01/08/2022   PLT 289 01/08/2022   MCHC 30.7 01/08/2022   RDW 14.9 01/08/2022   LYMPHSABS 1.6 01/08/2022   MONOABS 1.3 (H) 01/08/2022   EOSABS 0.7 (H) 01/08/2022   BASOSABS 0.1 38/32/9191     Last metabolic panel Lab Results  Component Value Date   NA 136 01/08/2022   K 4.5 01/08/2022   CL 95 (L) 01/08/2022   CO2 28 01/08/2022   BUN 17 01/08/2022   CREATININE 4.82 (H) 01/08/2022   GLUCOSE 85 01/08/2022   GFRNONAA 10 (L) 01/08/2022   GFRAA 6 (L) 11/29/2019   CALCIUM 9.4 01/08/2022   PHOS 4.3 01/08/2022   PROT 6.9 11/06/2021   ALBUMIN 2.7 (L) 01/08/2022   BILITOT 0.9 11/06/2021   ALKPHOS 62 11/06/2021   AST 28 11/06/2021   ALT 7 11/06/2021   ANIONGAP 13 01/08/2022     Radiology Studies: I have personally reviewed the imaging studies   No results found.     Estill Cotta M.D. [facility] 01/09/2022, 1:07 PM  Available via Epic secure chat 7am-7pm After 7 pm, please refer to night coverage provider listed on amion.

## 2022-01-10 DIAGNOSIS — N183 Chronic kidney disease, stage 3 unspecified: Secondary | ICD-10-CM | POA: Diagnosis not present

## 2022-01-10 DIAGNOSIS — N186 End stage renal disease: Secondary | ICD-10-CM | POA: Diagnosis not present

## 2022-01-10 DIAGNOSIS — S68113A Complete traumatic metacarpophalangeal amputation of left middle finger, initial encounter: Secondary | ICD-10-CM | POA: Diagnosis not present

## 2022-01-10 DIAGNOSIS — I5033 Acute on chronic diastolic (congestive) heart failure: Secondary | ICD-10-CM | POA: Diagnosis not present

## 2022-01-10 LAB — RENAL FUNCTION PANEL
Albumin: 2.6 g/dL — ABNORMAL LOW (ref 3.5–5.0)
Anion gap: 13 (ref 5–15)
BUN: 42 mg/dL — ABNORMAL HIGH (ref 6–20)
CO2: 26 mmol/L (ref 22–32)
Calcium: 9.7 mg/dL (ref 8.9–10.3)
Chloride: 96 mmol/L — ABNORMAL LOW (ref 98–111)
Creatinine, Ser: 9.71 mg/dL — ABNORMAL HIGH (ref 0.44–1.00)
GFR, Estimated: 4 mL/min — ABNORMAL LOW (ref >60)
Glucose, Bld: 96 mg/dL (ref 70–99)
Phosphorus: 6.8 mg/dL — ABNORMAL HIGH (ref 2.5–4.6)
Potassium: 5.3 mmol/L — ABNORMAL HIGH (ref 3.5–5.1)
Sodium: 135 mmol/L (ref 135–145)

## 2022-01-10 LAB — CBC
HCT: 25.5 % — ABNORMAL LOW (ref 36.0–46.0)
Hemoglobin: 7.8 g/dL — ABNORMAL LOW (ref 12.0–15.0)
MCH: 30.5 pg (ref 26.0–34.0)
MCHC: 30.6 g/dL (ref 30.0–36.0)
MCV: 99.6 fL (ref 80.0–100.0)
Platelets: 279 10*3/uL (ref 150–400)
RBC: 2.56 MIL/uL — ABNORMAL LOW (ref 3.87–5.11)
RDW: 14.7 % (ref 11.5–15.5)
WBC: 10.5 10*3/uL (ref 4.0–10.5)
nRBC: 0 % (ref 0.0–0.2)

## 2022-01-10 MED ORDER — HEPARIN SODIUM (PORCINE) 1000 UNIT/ML IJ SOLN
INTRAMUSCULAR | Status: AC
  Administered 2022-01-10: 2500 [IU] via INTRAVENOUS
  Filled 2022-01-10: qty 3

## 2022-01-10 MED ORDER — HEPARIN SODIUM (PORCINE) 1000 UNIT/ML DIALYSIS
1000.0000 [IU] | INTRAMUSCULAR | Status: DC | PRN
  Filled 2022-01-10: qty 1

## 2022-01-10 MED ORDER — ANTICOAGULANT SODIUM CITRATE 4% (200MG/5ML) IV SOLN
5.0000 mL | Status: DC | PRN
  Filled 2022-01-10: qty 5

## 2022-01-10 MED ORDER — ALTEPLASE 2 MG IJ SOLR: 2.0000 mg | Freq: Once | INTRAMUSCULAR | Status: DC | PRN

## 2022-01-10 MED ORDER — HEPARIN SODIUM (PORCINE) 1000 UNIT/ML IJ SOLN
2500.0000 [IU] | INTRAMUSCULAR | Status: AC | PRN
  Administered 2022-01-10: 2500 [IU] via INTRAVENOUS
  Filled 2022-01-10 (×2): qty 2.5
  Filled 2022-01-10: qty 3

## 2022-01-10 NOTE — Progress Notes (Signed)
[facility]                                                                              Courtney Gray, is a 51 y.o. female, DOB - 1970/11/16, [id number] Admit date - 11/01/2021    Outpatient Primary MD for the patient is Monico Blitz, MD  LOS - 22  days  Chief Complaint  Patient presents with   Vascular Access Problem       Brief summary   Patient is a 51 year old female with ESRD on HD TTS, diastolic CHF, PVD status post bilateral AKA (right in May, left on 7/5), IDDM, morbid obesity, chronic sacral wounds admitted on 7/28, presented with progressive shortness of breath in the setting of missed the dialysis, admitted for hypertensive emergency.  According to outpatient HD records, takes at least 4 staff members and [facility] lift for patient to be seated in dialysis chair. Patient was found to be in fluid overload, underwent R UE venogram on 8/30.  Awaiting placement where she can get the HD on a stretcher, difficult placement  Assessment & Plan    Principal Problem:   End stage renal disease ([place]) on hemodialysis, MWF -HD per nephrology.  - Per outpatient HD records takes at least 4 staff members and avoid limit for patient to be seated in dialysis chair -Patient has been awaiting facility where she can get the HD on a stretcher.  TOC continues to follow. -Continue PT -No acute medical issues -TOC and renal navigator assisting with SNF facilities for onsite HD via stretcher and so far unsuccessful  Active Problems: TDC malfunction -Had required 2 TDC exchanges and issues again on 9/4.  Plan was for RUE AVF on 9/14 but was postponed, then postponed again on 10/3 due to her refusal secondary to not feeling well -Vascular surgery following, recommended continue TDC use at this time  Acute on chronic diastolic CHF/volume overload/hypertensive emergency -Continue volume management with HD -BP stable, Imdur, metoprolol on hold -Continue midodrine 10 mg Monday  Wednesday Friday  Chronic respiratory failure with hypoxia -On O2 3 L at baseline -Wean O2 as tolerated  Anemia of chronic kidney disease/ESRD -ESA per nephrology, continue to monitor H&H -H/H stable  Diabetes mellitus type 2, IDDM, controlled with underlying ESRD -Recently had been on glipizide which was discontinued -Hemoglobin A1c 5.3%   Recent septic shock secondary to Bacteroides, bacteremia and liver, and splenic abscess -Completed antibiotic course    PAD status post bilateral AKA -Continue aspirin, statin  Hyperlipidemia -Continue statin   IBS with constipation, intractable nausea and vomiting -Diarrhea now improving, stool softeners now changed to as needed -No ongoing vomiting  Pressure injury documentation Right hip unstageable, POA Left lower flank, stage II  Obesity Estimated body mass index is 42.19 kg/m (pended) as calculated from the following:   Height as of this encounter: 5\' 5"  (1.651 m).   Weight as of this encounter: (P) 115 kg.  Code Status: Full code DVT Prophylaxis:  heparin injection 5,000 Units Start: 12/01/21 1400   Level of Care: Level of care: Med-Surg Family Communication: Updated patient  Disposition Plan:      Remains  inpatient appropriate: Difficult placement, needs dialysis unit outpatient with stretcher, TOC and renal navigator assisting  Procedures:  HD  Consultants:   Nephrology Vascular surgery  Antimicrobials:   Anti-infectives (From admission, onward)    Start     Dose/Rate Route Frequency Ordered Stop   01/06/22 0600  vancomycin (VANCOCIN) IVPB 1000 mg/200 mL premix        1,000 mg 200 mL/hr over 60 Minutes Intravenous To Surgery 01/05/22 0709 01/07/22 0600   11/28/21 1215  vancomycin (VANCOCIN) IVPB 1000 mg/200 mL premix        1,000 mg 200 mL/hr over 60 Minutes Intravenous  Once 11/28/21 1117 11/28/21 1512   11/26/21 1000  vancomycin (VANCOCIN) IVPB 1000 mg/200 mL premix        1,000 mg 200 mL/hr over 60  Minutes Intravenous On call 11/25/21 1628 11/26/21 1740   11/04/21 1800  ceFEPIme (MAXIPIME) 2 g in sodium chloride 0.9 % 100 mL IVPB        2 g 200 mL/hr over 30 Minutes Intravenous Every M-W-F (1800) 11/03/21 0952 11/08/21 2359   11/04/21 1200  ceFEPIme (MAXIPIME) 2 g in sodium chloride 0.9 % 100 mL IVPB  Status:  Discontinued        2 g 200 mL/hr over 30 Minutes Intravenous Every M-W-F (Hemodialysis) 11/02/21 0941 11/03/21 0952   11/02/21 1800  ceFEPIme (MAXIPIME) 2 g in sodium chloride 0.9 % 100 mL IVPB        2 g 200 mL/hr over 30 Minutes Intravenous  Once 11/02/21 0941 11/02/21 1812   11/02/21 1200  ceFEPIme (MAXIPIME) 2 g in sodium chloride 0.9 % 100 mL IVPB  Status:  Discontinued        2 g 200 mL/hr over 30 Minutes Intravenous Every T-Th-Sa (Hemodialysis) 11/01/21 1934 11/02/21 0941   11/01/21 2200  metroNIDAZOLE (FLAGYL) tablet 500 mg        500 mg Oral Every 12 hours 11/01/21 1827 11/08/21 2225   11/01/21 1945  ceFEPIme (MAXIPIME) 1 g in sodium chloride 0.9 % 100 mL IVPB        1 g 200 mL/hr over 30 Minutes Intravenous  Once 11/01/21 1933 11/01/21 2032   11/01/21 1730  ceFEPIme (MAXIPIME) 1 g in sodium chloride 0.9 % 100 mL IVPB  Status:  Discontinued        1 g 200 mL/hr over 30 Minutes Intravenous Every 24 hours 11/01/21 1720 11/01/21 1933          Medications  aspirin EC  81 mg Oral Daily   atorvastatin  10 mg Oral Daily   [START ON 01/15/2022] darbepoetin (ARANESP) injection - DIALYSIS  200 mcg Intravenous Q Wed-HD   feeding supplement  1 Container Oral TID BM   gabapentin  100 mg Oral Q1200   gabapentin  200 mg Oral QHS   heparin  5,000 Units Subcutaneous Q8H   levothyroxine  100 mcg Oral Q0600   loratadine  10 mg Oral Daily   lubiprostone  8 mcg Oral Q Tue   And   lubiprostone  8 mcg Oral Q Sat   midodrine  10 mg Oral Q M,W,F-HD   pantoprazole  40 mg Oral Daily   sevelamer carbonate  3.2 g Oral TID WC      Subjective:   Courtney Gray was seen and  examined today.  No acute complaints.  No fevers, nausea vomiting or abdominal pain.  Objective:   Vitals:   01/09/22 1655 01/09/22 2036 01/10/22 0536 01/10/22  0728  BP: (!) 139/40 (!) 140/55 (!) 153/69 134/73  Pulse: 75 83 79 78  Resp: 18 18 18  (!) 22  Temp: 98.6 F (37 C) 99.1 F (37.3 C) 98.7 F (37.1 C) 98.2 F (36.8 C)  TempSrc: Oral Oral Oral Oral  SpO2: 100% 95% 100% 100%  Weight:      Height:        Intake/Output Summary (Last 24 hours) at 01/10/2022 1241 Last data filed at 01/09/2022 2212 Gross per 24 hour  Intake 820 ml  Output 0 ml  Net 820 ml     Wt Readings from Last 3 Encounters:  01/08/22 (P) 115 kg  10/21/21 (!) 144 kg  09/03/21 (!) 150.6 kg   Physical Exam General: Alert and oriented x 3, NAD Cardiovascular: S1 S2 clear, RRR.  Respiratory: CTAB, no wheezing, Gastrointestinal: Soft, nontender, nondistended, NBS Ext: bilateral AKA    Data Reviewed:  I have personally reviewed following labs    CBC Lab Results  Component Value Date   WBC 10.9 (H) 01/08/2022   RBC 2.84 (L) 01/08/2022   HGB 8.5 (L) 01/08/2022   HCT 27.7 (L) 01/08/2022   MCV 97.5 01/08/2022   MCH 29.9 01/08/2022   PLT 289 01/08/2022   MCHC 30.7 01/08/2022   RDW 14.9 01/08/2022   LYMPHSABS 1.6 01/08/2022   MONOABS 1.3 (H) 01/08/2022   EOSABS 0.7 (H) 01/08/2022   BASOSABS 0.1 25/63/8937     Last metabolic panel Lab Results  Component Value Date   NA 136 01/08/2022   K 4.5 01/08/2022   CL 95 (L) 01/08/2022   CO2 28 01/08/2022   BUN 17 01/08/2022   CREATININE 4.82 (H) 01/08/2022   GLUCOSE 85 01/08/2022   GFRNONAA 10 (L) 01/08/2022   GFRAA 6 (L) 11/29/2019   CALCIUM 9.4 01/08/2022   PHOS 4.3 01/08/2022   PROT 6.9 11/06/2021   ALBUMIN 2.7 (L) 01/08/2022   BILITOT 0.9 11/06/2021   ALKPHOS 62 11/06/2021   AST 28 11/06/2021   ALT 7 11/06/2021   ANIONGAP 13 01/08/2022     Radiology Studies: I have personally reviewed the imaging studies  No results  found.     Estill Cotta M.D. [facility] 01/10/2022, 12:41 PM  Available via Epic secure chat 7am-7pm After 7 pm, please refer to night coverage provider listed on amion.

## 2022-01-10 NOTE — Progress Notes (Signed)
Tyler Pita, MD made aware pt. Does not have and IV.  Okay to keep that way for now

## 2022-01-10 NOTE — Progress Notes (Signed)
Navigator has contacted several snf facilities out of state to inquire if they provide on-site HD via stretcher. Multiple messages left at several snf but have received no responses from any of them. Messages left again today with 3 facilities. Will await any possible return calls. Pt has been denied for stretcher HD at the limited clinics in our area.   Melven Sartorius Renal Navigator [phone]

## 2022-01-10 NOTE — Progress Notes (Signed)
[facility] Progress Note   Subjective:   Patient seen and examined at bedside.  Denies CP, SOB, abdominal pain and n/v/d.  Reports being uncomfortable in bed and needing adjustment.  Ward clerk notified and to reach out to NT/RN.  Waiting for her daughters PTO to be approved to reschedule AVF surgery.   Objective Vitals:   01/09/22 2036 01/10/22 0536 01/10/22 0728 01/10/22 1247  BP: (!) 140/55 (!) 153/69 134/73 (!) 146/61  Pulse: 83 79 78 73  Resp: 18 18 (!) 22 20  Temp: 99.1 F (37.3 C) 98.7 F (37.1 C) 98.2 F (36.8 C) 98.4 F (36.9 C)  TempSrc: Oral Oral Oral Oral  SpO2: 95% 100% 100% 100%  Weight:      Height:       Physical Exam General:chronically ill appearing female in NAD Heart:RRR, no mrg Lungs:CTAB, nml WOB on 3L O2 via  Abdomen:Ssoft, NTND Extremities: b/l AKA, no LE edema Dialysis Access: [facility]   Filed Weights   01/06/22 2123 01/08/22 0031 01/08/22 0400  Weight: 119.3 kg 116.1 kg (P) 115 kg    Intake/Output Summary (Last 24 hours) at 01/10/2022 1409 Last data filed at 01/09/2022 2212 Gross per 24 hour  Intake 600 ml  Output 0 ml  Net 600 ml    Additional Objective Labs: Basic Metabolic Panel: Recent Labs  Lab 01/05/22 1833 01/06/22 1755 01/08/22 0738  NA 134* 136 136  K 5.0 5.8* 4.5  CL 95* 99 95*  CO2 26 27 28   GLUCOSE 112* 100* 85  BUN 21* 31* 17  CREATININE 5.70* 7.53* 4.82*  CALCIUM 10.5* 10.1 9.4  PHOS  --   --  4.3   Liver Function Tests: Recent Labs  Lab 01/08/22 0738  ALBUMIN 2.7*   CBC: Recent Labs  Lab 01/05/22 1833 01/06/22 1755 01/08/22 0738  WBC 10.8* 11.3* 10.9*  NEUTROABS  --   --  7.2  HGB 9.3* 8.4* 8.5*  HCT 30.2* 27.8* 27.7*  MCV 98.4 97.5 97.5  PLT 314 303 289    Medications:   aspirin EC  81 mg Oral Daily   atorvastatin  10 mg Oral Daily   [START ON 01/15/2022] darbepoetin (ARANESP) injection - DIALYSIS  200 mcg Intravenous Q Wed-HD   feeding supplement  1 Container Oral TID BM    gabapentin  100 mg Oral Q1200   gabapentin  200 mg Oral QHS   heparin  5,000 Units Subcutaneous Q8H   levothyroxine  100 mcg Oral Q0600   loratadine  10 mg Oral Daily   lubiprostone  8 mcg Oral Q Tue   And   lubiprostone  8 mcg Oral Q Sat   midodrine  10 mg Oral Q M,W,F-HD   pantoprazole  40 mg Oral Daily   sevelamer carbonate  3.2 g Oral TID WC    Dialysis Orders: MWF - [facility]  4hrs, BFR 400, DFR 600,  AVF, EDW 146kg, 3K/ 2Ca - last hep B labs 8/21 - Heparin 2500 units IV with HD - Hectorol 63mcg IV q HD, held as of 10/30/21   Assessment/Plan: ESRD: Typically MWF. Received HD early this AM 10/4-noted net UF 1.3L. Next HD 10/6 per routine schedule. K+ now 4.5. TDC malfunction: Had required two [facility] exchanges and having issues again on 9/4, got overnight tPA dwell. IR thinks TDC is positional as no fibrin on tip at last exchange. Discussed the need for a long term solution, VVS consulted - plan was for RUE  AVF 12/19/21 but was postponed. Unfortunately, R AVF placement was again postponed 10/2 d/t her not feeling well (c/o frequent stools). Discussed with Hospitalist, meds adjusted, and now she feels better. Waiting for her daughter to get PTO approved to reschedule at a time she can be with her afterwards. Heparin boluses restarted with dialysis  Frequent loose stools: Improved. Lactulose d/c.  Stool softeners now prn. BP/volume: BP variable, was on low dose metoprolol and midodine -> metop discontinued on 9/1.  Continue Midodrine with HD.  Last hoyer weight was 118kg which is down significantly from OP EDW -> lower on discharge. BP variable.  Continue UF as tolerated. Chronic respiratory failure with hypoxia on cannula oxygen at home: on 5L O2 at baseline, have been able to titrate down O2 to 2-3L Anemia of ESRD: Hgb now 8.5 - continue high dose ESA q Monday while here. Last tsat 16% on 9/14 with ferritin 1287, will getting short course of IV Fe. Transfuse < 7. Secondary  hyperparathyroidism: CorrCa improving, VDRA on hold. Renvela increased further on 9/30. 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 Severe sacral decubitus wound: Per primary/wound care team. Disposition: Difficult situation. Prior to admit, patient required 6 staff members and 2 hoyer pads for transfer into HD chair as OP. Could not stay on dialysis for full treatment because of pain of multiple decubitus ulcers and PTAR unable to lift pt into ambulance for transport. SW has been unable to locate stretcher dialysis location so far. Her weight is much lower from admit, asked PT about trying to hoyer to recliner for HD -> apparently would need hoyer that wraps around her legs and patient is only able to tolerate <1 hour sitting up at this point. Please see renal navigator note from 12/23/21  Jen Mow, PA-C [place] 01/10/2022,2:09 PM  LOS: 70 days

## 2022-01-10 NOTE — Progress Notes (Signed)
Received patient in bed to unit.  Alert and oriented.  Informed consent signed and in chart.   Treatment initiated: 1523 Treatment completed: 1853  Patient tolerated well.  Transported back to the room  Alert, without acute distress.  Hand-off given to patient's nurse.   Access used: catheter Access issues: none  Total UF removed: 1500 Medication(s) given: none Post HD VS: 98.4 121/63 76 17 98% Post HD weight: unable to obtain.  Bed doesn't work    [facility]

## 2022-01-11 DIAGNOSIS — N183 Chronic kidney disease, stage 3 unspecified: Secondary | ICD-10-CM | POA: Diagnosis not present

## 2022-01-11 DIAGNOSIS — N186 End stage renal disease: Secondary | ICD-10-CM | POA: Diagnosis not present

## 2022-01-11 DIAGNOSIS — S68113A Complete traumatic metacarpophalangeal amputation of left middle finger, initial encounter: Secondary | ICD-10-CM | POA: Diagnosis not present

## 2022-01-11 DIAGNOSIS — I5033 Acute on chronic diastolic (congestive) heart failure: Secondary | ICD-10-CM | POA: Diagnosis not present

## 2022-01-11 MED ORDER — DOCUSATE SODIUM 100 MG PO CAPS
100.0000 mg | ORAL_CAPSULE | Freq: Two times a day (BID) | ORAL | Status: DC
  Administered 2022-01-11 – 2022-05-15 (×220): 100 mg via ORAL
  Filled 2022-01-11 (×235): qty 1

## 2022-01-11 MED ORDER — POLYETHYLENE GLYCOL 3350 17 G PO PACK
17.0000 g | PACK | Freq: Once | ORAL | Status: AC
  Administered 2022-01-11: 17 g via ORAL
  Filled 2022-01-11: qty 1

## 2022-01-11 NOTE — Progress Notes (Signed)
[facility]                                                                              Courtney Gray, is a 51 y.o. female, DOB - [birth year], SHF:[id number] Admit date - 11/01/2021    Outpatient Primary MD for the patient is Monico Blitz, MD  LOS - 26  days  Chief Complaint  Patient presents with   Vascular Access Problem       Brief summary   Patient is a 51 year old female with ESRD on HD TTS, diastolic CHF, PVD status post bilateral AKA (right in May, left on 7/5), IDDM, morbid obesity, chronic sacral wounds admitted on 7/28, presented with progressive shortness of breath in the setting of missed the dialysis, admitted for hypertensive emergency.  According to outpatient HD records, takes at least 4 staff members and [facility] lift for patient to be seated in dialysis chair. Patient was found to be in fluid overload, underwent R UE venogram on 8/30.  Awaiting placement where she can get the HD on a stretcher, difficult placement  Assessment & Plan    Principal Problem:   End stage renal disease ([place]) on hemodialysis, MWF -HD per nephrology.  - Per outpatient HD records takes at least 4 staff members and avoid limit for patient to be seated in dialysis chair -Patient has been awaiting facility where she can get the HD on a stretcher.  TOC continues to follow. -Continue PT -TOC and renal navigator assisting with SNF facilities for onsite HD via stretcher and so far unsuccessful -No acute medical issues, awaiting placement  Active Problems: TDC malfunction -Had required 2 TDC exchanges and issues again on 9/4.  Plan was for RUE AVF on 9/14 but was postponed, then postponed again on 10/3 due to her refusal secondary to not feeling well -Vascular surgery following, recommended continue TDC use at this time  Acute on chronic diastolic CHF/volume overload/hypertensive emergency -Continue volume management with HD -BP stable, Imdur, metoprolol on hold -Continue  midodrine 10 mg Monday Wednesday Friday  Chronic respiratory failure with hypoxia -On O2 3 L at baseline -Wean O2 as tolerated  Anemia of chronic kidney disease/ESRD -ESA per nephrology, continue to monitor H&H -Hemoglobin 7.8, transfuse for hemoglobin less than 7.5  Diabetes mellitus type 2, IDDM, controlled with underlying ESRD -Recently had been on glipizide which was discontinued -Hemoglobin A1c 5.3%   Recent septic shock secondary to Bacteroides, bacteremia and liver, and splenic abscess -Completed antibiotic course    PAD status post bilateral AKA -Continue aspirin, statin  Hyperlipidemia -Continue statin   IBS with constipation, intractable nausea and vomiting -Patient had constipation, Amitiza was added, subsequently had diarrhea and the stool softeners were changed to as needed -Now complaining of constipation again, will continue Amitiza to Tuesday and Saturday, nondialysis days.  Placed on Colace twice daily. -Added MiraLAX 17 g p.o. x1 today per patient's request  Pressure injury documentation Right hip unstageable, POA Left lower flank, stage II  Obesity Estimated body mass index is 42.19 kg/m as calculated from the following:   Height as of this encounter: 5\' 5"  (1.651 m).   Weight as of  this encounter: 115 kg.  Code Status: Full code DVT Prophylaxis:  heparin injection 5,000 Units Start: 12/01/21 1400   Level of Care: Level of care: Med-Surg Family Communication: Updated patient  Disposition Plan:      Remains inpatient appropriate: Difficult placement, needs dialysis unit outpatient with stretcher, TOC and renal navigator assisting  Procedures:  HD  Consultants:   Nephrology Vascular surgery  Antimicrobials:   Anti-infectives (From admission, onward)    Start     Dose/Rate Route Frequency Ordered Stop   01/06/22 0600  vancomycin (VANCOCIN) IVPB 1000 mg/200 mL premix        1,000 mg 200 mL/hr over 60 Minutes Intravenous To Surgery 01/05/22  0709 01/07/22 0600   11/28/21 1215  vancomycin (VANCOCIN) IVPB 1000 mg/200 mL premix        1,000 mg 200 mL/hr over 60 Minutes Intravenous  Once 11/28/21 1117 11/28/21 1512   11/26/21 1000  vancomycin (VANCOCIN) IVPB 1000 mg/200 mL premix        1,000 mg 200 mL/hr over 60 Minutes Intravenous On call 11/25/21 1628 11/26/21 1740   11/04/21 1800  ceFEPIme (MAXIPIME) 2 g in sodium chloride 0.9 % 100 mL IVPB        2 g 200 mL/hr over 30 Minutes Intravenous Every M-W-F (1800) 11/03/21 0952 11/08/21 2359   11/04/21 1200  ceFEPIme (MAXIPIME) 2 g in sodium chloride 0.9 % 100 mL IVPB  Status:  Discontinued        2 g 200 mL/hr over 30 Minutes Intravenous Every M-W-F (Hemodialysis) 11/02/21 0941 11/03/21 0952   11/02/21 1800  ceFEPIme (MAXIPIME) 2 g in sodium chloride 0.9 % 100 mL IVPB        2 g 200 mL/hr over 30 Minutes Intravenous  Once 11/02/21 0941 11/02/21 1812   11/02/21 1200  ceFEPIme (MAXIPIME) 2 g in sodium chloride 0.9 % 100 mL IVPB  Status:  Discontinued        2 g 200 mL/hr over 30 Minutes Intravenous Every T-Th-Sa (Hemodialysis) 11/01/21 1934 11/02/21 0941   11/01/21 2200  metroNIDAZOLE (FLAGYL) tablet 500 mg        500 mg Oral Every 12 hours 11/01/21 1827 11/08/21 2225   11/01/21 1945  ceFEPIme (MAXIPIME) 1 g in sodium chloride 0.9 % 100 mL IVPB        1 g 200 mL/hr over 30 Minutes Intravenous  Once 11/01/21 1933 11/01/21 2032   11/01/21 1730  ceFEPIme (MAXIPIME) 1 g in sodium chloride 0.9 % 100 mL IVPB  Status:  Discontinued        1 g 200 mL/hr over 30 Minutes Intravenous Every 24 hours 11/01/21 1720 11/01/21 1933          Medications  aspirin EC  81 mg Oral Daily   atorvastatin  10 mg Oral Daily   [START ON 01/15/2022] darbepoetin (ARANESP) injection - DIALYSIS  200 mcg Intravenous Q Wed-HD   docusate sodium  100 mg Oral BID   feeding supplement  1 Container Oral TID BM   gabapentin  100 mg Oral Q1200   gabapentin  200 mg Oral QHS   heparin  5,000 Units Subcutaneous  Q8H   levothyroxine  100 mcg Oral Q0600   loratadine  10 mg Oral Daily   lubiprostone  8 mcg Oral Q Tue   And   lubiprostone  8 mcg Oral Q Sat   midodrine  10 mg Oral Q M,W,F-HD   pantoprazole  40 mg Oral Daily  sevelamer carbonate  3.2 g Oral TID WC      Subjective:   Audrionna Lampton was seen and examined today.  States now she is having constipation.  No abdominal pain, nausea vomiting.  No fevers  Objective:   Vitals:   01/10/22 1912 01/10/22 2140 01/11/22 0519 01/11/22 0933  BP: 121/63 128/63 (!) 151/62 (!) 141/45  Pulse: 76 73 71 84  Resp: 17 18 17 18   Temp: 98.4 F (36.9 C) 98.7 F (37.1 C) 98.3 F (36.8 C) 98.2 F (36.8 C)  TempSrc: Oral Oral  Oral  SpO2: 98% 100% 100% 100%  Weight:      Height:        Intake/Output Summary (Last 24 hours) at 01/11/2022 1349 Last data filed at 01/11/2022 0548 Gross per 24 hour  Intake --  Output 1500 ml  Net -1500 ml     Wt Readings from Last 3 Encounters:  10/21/21 (!) 144 kg  09/03/21 (!) 150.6 kg  08/28/21 (!) 150.1 kg   Physical Exam General: Alert and oriented x 3, NAD Cardiovascular: S1 S2 clear, RRR.  Respiratory: CTAB, no wheezing, rales or rhonchi Gastrointestinal: Soft, nontender, nondistended, NBS Ext: bilateral AKA Psych: Normal affect and demeanor    Data Reviewed:  I have personally reviewed following labs    CBC Lab Results  Component Value Date   WBC 10.5 01/10/2022   RBC 2.56 (L) 01/10/2022   HGB 7.8 (L) 01/10/2022   HCT 25.5 (L) 01/10/2022   MCV 99.6 01/10/2022   MCH 30.5 01/10/2022   PLT 279 01/10/2022   MCHC 30.6 01/10/2022   RDW 14.7 01/10/2022   LYMPHSABS 1.6 01/08/2022   MONOABS 1.3 (H) 01/08/2022   EOSABS 0.7 (H) 01/08/2022   BASOSABS 0.1 40/98/1191     Last metabolic panel Lab Results  Component Value Date   NA 135 01/10/2022   K 5.3 (H) 01/10/2022   CL 96 (L) 01/10/2022   CO2 26 01/10/2022   BUN 42 (H) 01/10/2022   CREATININE 9.71 (H) 01/10/2022   GLUCOSE 96  01/10/2022   GFRNONAA 4 (L) 01/10/2022   GFRAA 6 (L) 11/29/2019   CALCIUM 9.7 01/10/2022   PHOS 6.8 (H) 01/10/2022   PROT 6.9 11/06/2021   ALBUMIN 2.6 (L) 01/10/2022   BILITOT 0.9 11/06/2021   ALKPHOS 62 11/06/2021   AST 28 11/06/2021   ALT 7 11/06/2021   ANIONGAP 13 01/10/2022     Radiology Studies: I have personally reviewed the imaging studies  No results found.     Estill Cotta M.D. [facility] 01/11/2022, 1:49 PM  Available via Epic secure chat 7am-7pm After 7 pm, please refer to night coverage provider listed on amion.

## 2022-01-11 NOTE — Progress Notes (Signed)
[facility] Progress Note   Subjective:   Patient seen and examined at bedside.  No specific complaints.  Reports her daughter is available on 10/19 and 10/23 to be with her post surgery.  Advised to call [facility]VS office on Monday to try to get on the schedule for AVF placement. Wants to know who she needs to talk to about getting fitted for shrinker for stumps and wheelchair for when she finally discharges.    Objective Vitals:   01/10/22 1912 01/10/22 2140 01/11/22 0519 01/11/22 0933  BP: 121/63 128/63 (!) 151/62 (!) 141/45  Pulse: 76 73 71 84  Resp: 17 18 17 18   Temp: 98.4 F (36.9 C) 98.7 F (37.1 C) 98.3 F (36.8 C) 98.2 F (36.8 C)  TempSrc: Oral Oral  Oral  SpO2: 98% 100% 100% 100%  Weight:      Height:       Physical Exam General:WDWN female in NAD Heart:RRR, no mrg Lungs:CTAB, nml WOB on RA Abdomen:soft, NTND Extremities:no LE edema Dialysis Access: [facility]   Filed Weights   01/08/22 0031 01/08/22 0400  Weight: 116.1 kg 115 kg    Intake/Output Summary (Last 24 hours) at 01/11/2022 1232 Last data filed at 01/11/2022 0548 Gross per 24 hour  Intake 120 ml  Output 1500 ml  Net -1380 ml    Additional Objective Labs: Basic Metabolic Panel: Recent Labs  Lab 01/06/22 1755 01/08/22 0738 01/10/22 1206  NA 136 136 135  K 5.8* 4.5 5.3*  CL 99 95* 96*  CO2 27 28 26   GLUCOSE 100* 85 96  BUN 31* 17 42*  CREATININE 7.53* 4.82* 9.71*  CALCIUM 10.1 9.4 9.7  PHOS  --  4.3 6.8*   Liver Function Tests: Recent Labs  Lab 01/08/22 0738 01/10/22 1206  ALBUMIN 2.7* 2.6*    CBC: Recent Labs  Lab 01/05/22 1833 01/06/22 1755 01/08/22 0738 01/10/22 1206  WBC 10.8* 11.3* 10.9* 10.5  NEUTROABS  --   --  7.2  --   HGB 9.3* 8.4* 8.5* 7.8*  HCT 30.2* 27.8* 27.7* 25.5*  MCV 98.4 97.5 97.5 99.6  PLT 314 303 289 279    Medications:   aspirin EC  81 mg Oral Daily   atorvastatin  10 mg Oral Daily   [START ON 01/15/2022] darbepoetin (ARANESP) injection -  DIALYSIS  200 mcg Intravenous Q Wed-HD   docusate sodium  100 mg Oral BID   feeding supplement  1 Container Oral TID BM   gabapentin  100 mg Oral Q1200   gabapentin  200 mg Oral QHS   heparin  5,000 Units Subcutaneous Q8H   levothyroxine  100 mcg Oral Q0600   loratadine  10 mg Oral Daily   lubiprostone  8 mcg Oral Q Tue   And   lubiprostone  8 mcg Oral Q Sat   midodrine  10 mg Oral Q M,W,F-HD   pantoprazole  40 mg Oral Daily   sevelamer carbonate  3.2 g Oral TID WC    Dialysis Orders: MWF - [facility]  4hrs, BFR 400, DFR 600,  AVF, EDW 146kg, 3K/ 2Ca - last hep B labs 8/21 - Heparin 2500 units IV with HD - Hectorol 74mcg IV q HD, held as of 10/30/21   Assessment/Plan: ESRD: Typically MWF. Received HD early this AM 10/4-noted net UF 1.3L. Next HD 10/6 per routine schedule. K+ now 4.5. TDC malfunction: Had required two [facility] exchanges and having issues again on 9/4, got overnight tPA dwell. IR  thinks TDC is positional as no fibrin on tip at last exchange. Discussed the need for a long term solution, VVS consulted - plan was for RUE AVF 12/19/21 but was postponed. Unfortunately, R AVF placement was again postponed 10/2 d/t her not feeling well (c/o frequent stools). Discussed with Hospitalist, meds adjusted, and now she feels better. Patient has dates she says work for her for surgery.  Per VVS last note she "would best be served with continued [facility] use at this time"  Recommended she try calling the office on Monday to see if she could get on their surgery schedule. Heparin boluses restarted with dialysis  Frequent loose stools: Improved. Lactulose d/c.  Stool softeners now prn. BP/volume: BP variable, was on low dose metoprolol and midodine -> metop discontinued on 9/1.  Continue Midodrine with HD.  Last hoyer weight was 118kg which is down significantly from OP EDW -> lower on discharge. BP variable.  Continue UF as tolerated. Chronic respiratory failure with hypoxia on cannula  oxygen at home: on 5L O2 at baseline, have been able to titrate down O2 to 2-3L Anemia of ESRD: Hgb now 7.8 - continue high dose ESA q Monday while here. Last tsat 16% on 9/14 with ferritin 1287, will getting short course of IV Fe. Transfuse < 7. Secondary hyperparathyroidism: CorrCa improving, VDRA on hold. Renvela increased further on 9/30. 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 Severe sacral decubitus wound: Per primary/wound care team. Disposition: Difficult situation. Prior to admit, patient required 6 staff members and 2 hoyer pads for transfer into HD chair as OP. Could not stay on dialysis for full treatment because of pain of multiple decubitus ulcers and PTAR unable to lift pt into ambulance for transport. SW has been unable to locate stretcher dialysis location so far. Her weight is much lower from admit, asked PT about trying to hoyer to recliner for HD -> apparently would need hoyer that wraps around her legs and patient is only able to tolerate <1 hour sitting up at this point. Please see renal navigator note from 12/23/21. Renal navigator has reached out to several out of state facilities to see if they have onsite stretcher dialysis - waiting for return calls.   Jen Mow, PA-C [facility] 01/11/2022,12:32 PM  LOS: 71 days

## 2022-01-12 DIAGNOSIS — D631 Anemia in chronic kidney disease: Secondary | ICD-10-CM | POA: Diagnosis not present

## 2022-01-12 DIAGNOSIS — S68113A Complete traumatic metacarpophalangeal amputation of left middle finger, initial encounter: Secondary | ICD-10-CM | POA: Diagnosis not present

## 2022-01-12 DIAGNOSIS — I5033 Acute on chronic diastolic (congestive) heart failure: Secondary | ICD-10-CM | POA: Diagnosis not present

## 2022-01-12 DIAGNOSIS — N186 End stage renal disease: Secondary | ICD-10-CM | POA: Diagnosis not present

## 2022-01-12 MED ORDER — LUBIPROSTONE 8 MCG PO CAPS
8.0000 ug | ORAL_CAPSULE | Freq: Once | ORAL | Status: AC
  Administered 2022-01-12: 8 ug via ORAL
  Filled 2022-01-12: qty 1

## 2022-01-12 NOTE — Progress Notes (Signed)
[facility]                                                                              Courtney Gray, is a 51 y.o. female, DOB - [birth year], YSA:[id number] Admit date - 11/01/2021    Outpatient Primary MD for the patient is Monico Blitz, MD  LOS - 88  days  Chief Complaint  Patient presents with   Vascular Access Problem       Brief summary   Patient is a 51 year old female with ESRD on HD TTS, diastolic CHF, PVD status post bilateral AKA (right in May, left on 7/5), IDDM, morbid obesity, chronic sacral wounds admitted on 7/28, presented with progressive shortness of breath in the setting of missed the dialysis, admitted for hypertensive emergency.  According to outpatient HD records, takes at least 4 staff members and [facility] lift for patient to be seated in dialysis chair. Patient was found to be in fluid overload, underwent R UE venogram on 8/30.  Awaiting placement where she can get the HD on a stretcher, difficult placement  Assessment & Plan    Principal Problem:   End stage renal disease ([place]) on hemodialysis, MWF -HD per nephrology.  - Per outpatient HD records takes at least 4 staff members and avoid limit for patient to be seated in dialysis chair -Patient has been awaiting facility where she can get the HD on a stretcher.  TOC continues to follow. -Continue PT -TOC and renal navigator assisting with SNF facilities for onsite HD via stretcher and so far unsuccessful -Awaiting placement  Active Problems: TDC malfunction -Had required 2 TDC exchanges and issues again on 9/4.  Plan was for RUE AVF on 9/14 but was postponed, then postponed again on 10/3 due to her refusal secondary to not feeling well -Vascular surgery following, recommended continue TDC use at this time  Acute on chronic diastolic CHF/volume overload/hypertensive emergency -Continue volume management with HD -BP stable, Imdur, metoprolol on hold -Continue midodrine 10 mg Monday  Wednesday Friday  Chronic respiratory failure with hypoxia -On O2 3 L at baseline -Wean O2 as tolerated  Anemia of chronic kidney disease/ESRD -ESA per nephrology, continue to monitor H&H -Hemoglobin 7.8, transfuse for hemoglobin less than 7.5  Diabetes mellitus type 2, IDDM, controlled with underlying ESRD -Recently had been on glipizide which was discontinued -Hemoglobin A1c 5.3%   Recent septic shock secondary to Bacteroides, bacteremia and liver, and splenic abscess -Completed antibiotic course    PAD status post bilateral AKA -Continue aspirin, statin  Hyperlipidemia -Continue statin   IBS with constipation, intractable nausea and vomiting -Patient had constipation, Amitiza was added, subsequently had diarrhea and the stool softeners were changed to as needed -Now complaining of constipation again, will continue Amitiza to Tuesday and Saturday, nondialysis days.  Placed on Colace twice daily. -States did not have a good BM yesterday, requested Amitiza today  Pressure injury documentation Right hip unstageable, POA Left lower flank, stage II  Obesity Estimated body mass index is 42.19 kg/m as calculated from the following:   Height as of this encounter: 5\' 5"  (1.651 m).   Weight as of this encounter: 115  kg.  Code Status: Full code DVT Prophylaxis:  heparin injection 5,000 Units Start: 12/01/21 1400   Level of Care: Level of care: Med-Surg Family Communication: Updated patient  Disposition Plan:      Remains inpatient appropriate: Difficult placement, needs dialysis unit outpatient with stretcher, TOC and renal navigator assisting  Procedures:  HD  Consultants:   Nephrology Vascular surgery  Antimicrobials:   Anti-infectives (From admission, onward)    Start     Dose/Rate Route Frequency Ordered Stop   01/06/22 0600  vancomycin (VANCOCIN) IVPB 1000 mg/200 mL premix        1,000 mg 200 mL/hr over 60 Minutes Intravenous To Surgery 01/05/22 0709 01/07/22  0600   11/28/21 1215  vancomycin (VANCOCIN) IVPB 1000 mg/200 mL premix        1,000 mg 200 mL/hr over 60 Minutes Intravenous  Once 11/28/21 1117 11/28/21 1512   11/26/21 1000  vancomycin (VANCOCIN) IVPB 1000 mg/200 mL premix        1,000 mg 200 mL/hr over 60 Minutes Intravenous On call 11/25/21 1628 11/26/21 1740   11/04/21 1800  ceFEPIme (MAXIPIME) 2 g in sodium chloride 0.9 % 100 mL IVPB        2 g 200 mL/hr over 30 Minutes Intravenous Every M-W-F (1800) 11/03/21 0952 11/08/21 2359   11/04/21 1200  ceFEPIme (MAXIPIME) 2 g in sodium chloride 0.9 % 100 mL IVPB  Status:  Discontinued        2 g 200 mL/hr over 30 Minutes Intravenous Every M-W-F (Hemodialysis) 11/02/21 0941 11/03/21 0952   11/02/21 1800  ceFEPIme (MAXIPIME) 2 g in sodium chloride 0.9 % 100 mL IVPB        2 g 200 mL/hr over 30 Minutes Intravenous  Once 11/02/21 0941 11/02/21 1812   11/02/21 1200  ceFEPIme (MAXIPIME) 2 g in sodium chloride 0.9 % 100 mL IVPB  Status:  Discontinued        2 g 200 mL/hr over 30 Minutes Intravenous Every T-Th-Sa (Hemodialysis) 11/01/21 1934 11/02/21 0941   11/01/21 2200  metroNIDAZOLE (FLAGYL) tablet 500 mg        500 mg Oral Every 12 hours 11/01/21 1827 11/08/21 2225   11/01/21 1945  ceFEPIme (MAXIPIME) 1 g in sodium chloride 0.9 % 100 mL IVPB        1 g 200 mL/hr over 30 Minutes Intravenous  Once 11/01/21 1933 11/01/21 2032   11/01/21 1730  ceFEPIme (MAXIPIME) 1 g in sodium chloride 0.9 % 100 mL IVPB  Status:  Discontinued        1 g 200 mL/hr over 30 Minutes Intravenous Every 24 hours 11/01/21 1720 11/01/21 1933          Medications  aspirin EC  81 mg Oral Daily   atorvastatin  10 mg Oral Daily   [START ON 01/15/2022] darbepoetin (ARANESP) injection - DIALYSIS  200 mcg Intravenous Q Wed-HD   docusate sodium  100 mg Oral BID   feeding supplement  1 Container Oral TID BM   gabapentin  100 mg Oral Q1200   gabapentin  200 mg Oral QHS   heparin  5,000 Units Subcutaneous Q8H    levothyroxine  100 mcg Oral Q0600   loratadine  10 mg Oral Daily   lubiprostone  8 mcg Oral Q Tue   And   lubiprostone  8 mcg Oral Q Sat   lubiprostone  8 mcg Oral Once   midodrine  10 mg Oral Q M,W,F-HD   pantoprazole  40 mg Oral Daily   sevelamer carbonate  3.2 g Oral TID WC      Subjective:   Courtney Gray was seen and examined today.  No acute complaints except constipation.  No abdominal nausea vomiting, no fevers   Objective:   Vitals:   01/11/22 1843 01/11/22 2112 01/12/22 0533 01/12/22 0909  BP: (!) 139/52 (!) 139/53 (!) 135/93 (!) 122/51  Pulse: 81 78 74 73  Resp: 18 17 18 18   Temp: 98.2 F (36.8 C) 98.9 F (37.2 C) 97.6 F (36.4 C) 98.5 F (36.9 C)  TempSrc: Oral Oral Oral Oral  SpO2: 100% 97% 100% 100%  Weight:      Height:        Intake/Output Summary (Last 24 hours) at 01/12/2022 1148 Last data filed at 01/12/2022 0700 Gross per 24 hour  Intake 360 ml  Output 1 ml  Net 359 ml     Wt Readings from Last 3 Encounters:  10/21/21 (!) 144 kg  09/03/21 (!) 150.6 kg  08/28/21 (!) 150.1 kg   Physical Exam General: Alert and oriented x 3, NAD Cardiovascular: S1 S2 clear, RRR.  Respiratory: CTAB, no wheezing, rales or rhonchi Gastrointestinal: Soft, nontender, nondistended, NBS Ext: b/l AKA Psych: Normal affect and demeanor    Data Reviewed:  I have personally reviewed following labs    CBC Lab Results  Component Value Date   WBC 10.5 01/10/2022   RBC 2.56 (L) 01/10/2022   HGB 7.8 (L) 01/10/2022   HCT 25.5 (L) 01/10/2022   MCV 99.6 01/10/2022   MCH 30.5 01/10/2022   PLT 279 01/10/2022   MCHC 30.6 01/10/2022   RDW 14.7 01/10/2022   LYMPHSABS 1.6 01/08/2022   MONOABS 1.3 (H) 01/08/2022   EOSABS 0.7 (H) 01/08/2022   BASOSABS 0.1 26/37/8588     Last metabolic panel Lab Results  Component Value Date   NA 135 01/10/2022   K 5.3 (H) 01/10/2022   CL 96 (L) 01/10/2022   CO2 26 01/10/2022   BUN 42 (H) 01/10/2022   CREATININE 9.71 (H)  01/10/2022   GLUCOSE 96 01/10/2022   GFRNONAA 4 (L) 01/10/2022   GFRAA 6 (L) 11/29/2019   CALCIUM 9.7 01/10/2022   PHOS 6.8 (H) 01/10/2022   PROT 6.9 11/06/2021   ALBUMIN 2.6 (L) 01/10/2022   BILITOT 0.9 11/06/2021   ALKPHOS 62 11/06/2021   AST 28 11/06/2021   ALT 7 11/06/2021   ANIONGAP 13 01/10/2022     Radiology Studies: I have personally reviewed the imaging studies  No results found.     Estill Cotta M.D. [facility] 01/12/2022, 11:48 AM  Available via Epic secure chat 7am-7pm After 7 pm, please refer to night coverage provider listed on amion.

## 2022-01-12 NOTE — Progress Notes (Addendum)
[facility] Progress Note   Subjective:   Patient seen and examined at bedside.  No specific complaints. Slept well overnight.  Denies CP, SOB, abdominal pain and n/v/d. Patient asking if she would be able to have time on HD reduced since she has had significant weight loss since admission.    Objective Vitals:   01/11/22 1843 01/11/22 2112 01/12/22 0533 01/12/22 0909  BP: (!) 139/52 (!) 139/53 (!) 135/93 (!) 122/51  Pulse: 81 78 74 73  Resp: 18 17 18 18   Temp: 98.2 F (36.8 C) 98.9 F (37.2 C) 97.6 F (36.4 C) 98.5 F (36.9 C)  TempSrc: Oral Oral Oral Oral  SpO2: 100% 97% 100% 100%  Weight:      Height:       Physical Exam General:WDWN female in NAD Heart:RRR, no mrg Lungs:CTAB, nml WOB on RA Abdomen:soft, NTND Extremities:no LE edema Dialysis Access: [facility]   Filed Weights   01/08/22 0031 01/08/22 0400  Weight: 116.1 kg 115 kg    Intake/Output Summary (Last 24 hours) at 01/12/2022 1109 Last data filed at 01/12/2022 0700 Gross per 24 hour  Intake 360 ml  Output 1 ml  Net 359 ml    Additional Objective Labs: Basic Metabolic Panel: Recent Labs  Lab 01/06/22 1755 01/08/22 0738 01/10/22 1206  NA 136 136 135  K 5.8* 4.5 5.3*  CL 99 95* 96*  CO2 27 28 26   GLUCOSE 100* 85 96  BUN 31* 17 42*  CREATININE 7.53* 4.82* 9.71*  CALCIUM 10.1 9.4 9.7  PHOS  --  4.3 6.8*   Liver Function Tests: Recent Labs  Lab 01/08/22 0738 01/10/22 1206  ALBUMIN 2.7* 2.6*   CBC: Recent Labs  Lab 01/05/22 1833 01/06/22 1755 01/08/22 0738 01/10/22 1206  WBC 10.8* 11.3* 10.9* 10.5  NEUTROABS  --   --  7.2  --   HGB 9.3* 8.4* 8.5* 7.8*  HCT 30.2* 27.8* 27.7* 25.5*  MCV 98.4 97.5 97.5 99.6  PLT 314 303 289 279   Blood Culture    Component Value Date/Time   SDES BLOOD LEFT ANTECUBITAL 10/16/2021 1534   SPECREQUEST  10/16/2021 1534    BOTTLES DRAWN AEROBIC AND ANAEROBIC Blood Culture results may not be optimal due to an inadequate volume of blood received  in culture bottles   CULT  10/16/2021 1534    NO GROWTH 5 DAYS Performed at [facility], [address], [place], [place] [address]    REPTSTATUS 10/21/2021 FINAL 10/16/2021 1534    Medications:   aspirin EC  81 mg Oral Daily   atorvastatin  10 mg Oral Daily   [START ON 01/15/2022] darbepoetin (ARANESP) injection - DIALYSIS  200 mcg Intravenous Q Wed-HD   docusate sodium  100 mg Oral BID   feeding supplement  1 Container Oral TID BM   gabapentin  100 mg Oral Q1200   gabapentin  200 mg Oral QHS   heparin  5,000 Units Subcutaneous Q8H   levothyroxine  100 mcg Oral Q0600   loratadine  10 mg Oral Daily   lubiprostone  8 mcg Oral Q Tue   And   lubiprostone  8 mcg Oral Q Sat   lubiprostone  8 mcg Oral Once   midodrine  10 mg Oral Q M,W,F-HD   pantoprazole  40 mg Oral Daily   sevelamer carbonate  3.2 g Oral TID WC    Dialysis Orders: MWF - [place]  4hrs, BFR 400, DFR 600,  AVF,  EDW 146kg, 3K/ 2Ca - last hep B labs 8/21 - Heparin 2500 units IV with HD - Hectorol 29mcg IV q HD, held as of 10/30/21   Assessment/Plan: ESRD: on MWF. Next HD 01/13/22 per regular schedule. Check pre and post BUN to calculate URR for clearance to help in decision about possible reduction in treatment time.  TDC malfunction: Had required two [facility] exchanges and having issues again on 9/4, got overnight tPA dwell. IR thinks TDC is positional as no fibrin on tip at last exchange. Discussed the need for a long term solution, VVS consulted - plan was for RUE AVF 12/19/21 but was postponed. Unfortunately, R AVF placement was again postponed 10/2 d/t her not feeling well (c/o frequent stools). Discussed with Hospitalist, meds adjusted, and now she feels better. Patient has dates she says work for her for surgery.  Per VVS last note she "would best be served with continued [facility] use at this time"  Recommended she try calling the office on Monday to see if she could get on their surgery schedule. Heparin  boluses restarted with dialysis  Frequent loose stools: Improved. Lactulose d/c.  Stool softeners now prn. BP/volume: BP variable, was on low dose metoprolol and midodine -> metop discontinued on 9/1.  Continue Midodrine with HD.  Last hoyer weight was 118kg which is down significantly from OP EDW -> lower on discharge. BP variable.  Continue UF as tolerated. Chronic respiratory failure with hypoxia on cannula oxygen at home: on 5L O2 at baseline, have been able to titrate down O2 to 2-3L Anemia of ESRD: Last Hgb 7.8 - continue high dose ESA q Monday while here. Last tsat 16% on 9/14 with ferritin 1287, will getting short course of IV Fe. Transfuse < 7. Secondary hyperparathyroidism: CorrCa improving, VDRA on hold. Phos variable but overall improved, Renvela increased on 9/30 to 3.2g TID. 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 Severe sacral decubitus wound: Per primary/wound care team. Disposition: Difficult situation. Prior to admit, patient required 6 staff members and 2 hoyer pads for transfer into HD chair as OP. Could not stay on dialysis for full treatment because of pain of multiple decubitus ulcers and PTAR unable to lift pt into ambulance for transport. SW has been unable to locate stretcher dialysis location so far. Her weight is much lower from admit, asked PT about trying to hoyer to recliner for HD -> apparently would need hoyer that wraps around her legs and patient is only able to tolerate <1 hour sitting up at this point. Please see renal navigator note from 12/23/21. Renal navigator has reached out to several out of state facilities to see if they have onsite stretcher dialysis - waiting for return calls.   Jen Mow, PA-C [facility] 01/12/2022,11:09 AM  LOS: 72 days

## 2022-01-13 DIAGNOSIS — S68113A Complete traumatic metacarpophalangeal amputation of left middle finger, initial encounter: Secondary | ICD-10-CM | POA: Diagnosis not present

## 2022-01-13 DIAGNOSIS — N183 Chronic kidney disease, stage 3 unspecified: Secondary | ICD-10-CM | POA: Diagnosis not present

## 2022-01-13 DIAGNOSIS — I5033 Acute on chronic diastolic (congestive) heart failure: Secondary | ICD-10-CM | POA: Diagnosis not present

## 2022-01-13 DIAGNOSIS — N186 End stage renal disease: Secondary | ICD-10-CM | POA: Diagnosis not present

## 2022-01-13 LAB — RENAL FUNCTION PANEL
Albumin: 2.5 g/dL — ABNORMAL LOW (ref 3.5–5.0)
Anion gap: 10 (ref 5–15)
BUN: 44 mg/dL — ABNORMAL HIGH (ref 6–20)
CO2: 27 mmol/L (ref 22–32)
Calcium: 9.8 mg/dL (ref 8.9–10.3)
Chloride: 101 mmol/L (ref 98–111)
Creatinine, Ser: 9.81 mg/dL — ABNORMAL HIGH (ref 0.44–1.00)
GFR, Estimated: 4 mL/min — ABNORMAL LOW (ref >60)
Glucose, Bld: 91 mg/dL (ref 70–99)
Phosphorus: 6.5 mg/dL — ABNORMAL HIGH (ref 2.5–4.6)
Potassium: 6 mmol/L — ABNORMAL HIGH (ref 3.5–5.1)
Sodium: 138 mmol/L (ref 135–145)

## 2022-01-13 LAB — CBC
HCT: 25.2 % — ABNORMAL LOW (ref 36.0–46.0)
Hemoglobin: 7.6 g/dL — ABNORMAL LOW (ref 12.0–15.0)
MCH: 30 pg (ref 26.0–34.0)
MCHC: 30.2 g/dL (ref 30.0–36.0)
MCV: 99.6 fL (ref 80.0–100.0)
Platelets: 317 10*3/uL (ref 150–400)
RBC: 2.53 MIL/uL — ABNORMAL LOW (ref 3.87–5.11)
RDW: 15.6 % — ABNORMAL HIGH (ref 11.5–15.5)
WBC: 10 10*3/uL (ref 4.0–10.5)
nRBC: 0 % (ref 0.0–0.2)

## 2022-01-13 MED ORDER — ALTEPLASE 2 MG IJ SOLR: 2.0000 mg | Freq: Once | INTRAMUSCULAR | Status: DC | PRN

## 2022-01-13 MED ORDER — HEPARIN SODIUM (PORCINE) 1000 UNIT/ML IJ SOLN
2500.0000 [IU] | Freq: Once | INTRAMUSCULAR | Status: AC
  Administered 2022-01-13: 2500 [IU] via INTRAVENOUS
  Filled 2022-01-13: qty 3

## 2022-01-13 MED ORDER — LIDOCAINE HCL (PF) 1 % IJ SOLN: 5.0000 mL | INTRAMUSCULAR | Status: DC | PRN

## 2022-01-13 MED ORDER — LIDOCAINE-PRILOCAINE 2.5-2.5 % EX CREA: 1.0000 | TOPICAL_CREAM | CUTANEOUS | Status: DC | PRN

## 2022-01-13 MED ORDER — PENTAFLUOROPROP-TETRAFLUOROETH EX AERO: 1.0000 | INHALATION_SPRAY | CUTANEOUS | Status: DC | PRN

## 2022-01-13 MED ORDER — HEPARIN SODIUM (PORCINE) 1000 UNIT/ML DIALYSIS
3000.0000 [IU] | INTRAMUSCULAR | Status: DC | PRN
  Administered 2022-01-13: 3000 [IU] via INTRAVENOUS_CENTRAL
  Filled 2022-01-13 (×2): qty 3

## 2022-01-13 MED ORDER — HEPARIN SODIUM (PORCINE) 1000 UNIT/ML DIALYSIS
1000.0000 [IU] | INTRAMUSCULAR | Status: DC | PRN
  Filled 2022-01-13: qty 1

## 2022-01-13 MED ORDER — HEPARIN SODIUM (PORCINE) 1000 UNIT/ML IJ SOLN
INTRAMUSCULAR | Status: AC
  Administered 2022-01-13: 4000 [IU]
  Filled 2022-01-13: qty 4

## 2022-01-13 MED ORDER — ANTICOAGULANT SODIUM CITRATE 4% (200MG/5ML) IV SOLN: 5.0000 mL | Status: DC | PRN

## 2022-01-13 NOTE — Progress Notes (Signed)
Received patient in bed to unit.  Alert and oriented.  Informed consent signed and in chart.   Treatment initiated: 1302 Treatment completed: 1649  Patient tolerated well.  Transported back to the room  Alert, without acute distress.  Hand-off given to patient's nurse.   Access used: catheter Access issues: none  Total UF removed: 3000 Medication(s) given: heparin bolus Post HD VS: 98.1,110/45(66), HR-94, RR-18, SP02-100 Post HD weight: unable to get weight. Bed was inaccurate.   Gave 3000 heparin bolus at beginning and 2500 heparin bolus mid-treatment. No issues, treatment was completed successfully [facility]

## 2022-01-13 NOTE — TOC Progression Note (Signed)
Transition of Care [facility]) - Progression Note    Patient Details  Name: Courtney Gray MRN: [id number] Date of Birth: 02/14/1971  Transition of Care [facility]) CM/SW [place], LCSW Phone Number: 01/13/2022, 10:12 AM  Clinical Narrative:    TOC continuing to follow.   Expected Discharge Plan: Long Term Nursing Home Barriers to Discharge: No SNF bed, Transportation, Waiting for outpatient dialysis  Expected Discharge Plan and Services Expected Discharge Plan: [place]       Living arrangements for the past 2 months: Single Family Home                                       Social Determinants of Health (SDOH) Interventions    Readmission Risk Interventions    09/16/2021    4:36 PM 08/28/2021   11:50 AM 07/12/2021   12:38 PM  Readmission Risk Prevention Plan  Transportation Screening Complete Complete Complete  Medication Review [employer])  Complete Complete  PCP or Specialist appointment within 3-5 days of discharge  Complete Complete  HRI or Home Care Consult Complete Complete Complete  SW Recovery Care/Counseling Consult Complete Complete Complete  Palliative Care Screening  Not Applicable Not Applicable  Skilled Nursing Facility Complete Not Applicable Complete

## 2022-01-13 NOTE — Progress Notes (Signed)
[facility]                                                                              Courtney Gray, is a 51 y.o. female, DOB - 04-14-1970, DTO:[id number] Admit date - 11/01/2021    Outpatient Primary MD for the patient is Monico Blitz, MD  LOS - 60  days  Chief Complaint  Patient presents with   Vascular Access Problem       Brief summary   Patient is a 51 year old female with ESRD on HD TTS, diastolic CHF, PVD status post bilateral AKA (right in May, left on 7/5), IDDM, morbid obesity, chronic sacral wounds admitted on 7/28, presented with progressive shortness of breath in the setting of missed the dialysis, admitted for hypertensive emergency.  According to outpatient HD records, takes at least 4 staff members and [facility] lift for patient to be seated in dialysis chair. Patient was found to be in fluid overload, underwent R UE venogram on 8/30.  Awaiting placement where she can get the HD on a stretcher, difficult placement  Assessment & Plan    Principal Problem:   End stage renal disease ([place]) on hemodialysis, MWF -HD per nephrology.  - Per outpatient HD records takes at least 4 staff members and avoid limit for patient to be seated in dialysis chair -Patient has been awaiting facility where she can get the HD on a stretcher.  TOC continues to follow. -TOC and renal navigator assisting with SNF facilities for onsite HD via stretcher and so far unsuccessful.  Continue PT. -Awaiting placement  Active Problems: TDC malfunction -Had required 2 TDC exchanges and issues again on 9/4.  Plan was for RUE AVF on 9/14 but was postponed, then postponed again on 10/3 due to her refusal secondary to not feeling well -Vascular surgery following, recommended continue TDC use at this time  Acute on chronic diastolic CHF/volume overload/hypertensive emergency -Continue volume management with HD -BP stable, Imdur, metoprolol on hold -Continue midodrine 10 mg Monday  Wednesday Friday  Chronic respiratory failure with hypoxia -On O2 3 L at baseline -Currently O2 sats 96% on room air  Anemia of chronic kidney disease/ESRD -ESA per nephrology, continue to monitor H&H -Hb 7.8  Diabetes mellitus type 2, IDDM, controlled with underlying ESRD -Recently had been on glipizide which was discontinued -Hemoglobin A1c 5.3%   Recent septic shock secondary to Bacteroides, bacteremia and liver, and splenic abscess -Completed antibiotic course    PAD status post bilateral AKA -Continue aspirin, statin  Hyperlipidemia -Continue statin   IBS with constipation, intractable nausea and vomiting -Patient had constipation, Amitiza was added, subsequently had diarrhea and the stool softeners were changed to as needed -Now complaining of constipation again, will continue Amitiza to Tuesday and Saturday, nondialysis days.  Placed on Colace twice daily.  Pressure injury documentation Right hip unstageable, POA Left lower flank, stage II  Obesity Estimated body mass index is 42.19 kg/m as calculated from the following:   Height as of this encounter: 5\' 5"  (1.651 m).   Weight as of this encounter: 115 kg.  Code Status: Full code DVT Prophylaxis:  heparin injection 5,000 Units  Start: 12/01/21 1400   Level of Care: Level of care: Med-Surg Family Communication: Updated patient  Disposition Plan:      Remains inpatient appropriate: Difficult placement, needs dialysis unit outpatient with stretcher, TOC and renal navigator assisting  Procedures:  HD  Consultants:   Nephrology Vascular surgery  Antimicrobials:   Anti-infectives (From admission, onward)    Start     Dose/Rate Route Frequency Ordered Stop   01/06/22 0600  vancomycin (VANCOCIN) IVPB 1000 mg/200 mL premix        1,000 mg 200 mL/hr over 60 Minutes Intravenous To Surgery 01/05/22 0709 01/07/22 0600   11/28/21 1215  vancomycin (VANCOCIN) IVPB 1000 mg/200 mL premix        1,000 mg 200 mL/hr  over 60 Minutes Intravenous  Once 11/28/21 1117 11/28/21 1512   11/26/21 1000  vancomycin (VANCOCIN) IVPB 1000 mg/200 mL premix        1,000 mg 200 mL/hr over 60 Minutes Intravenous On call 11/25/21 1628 11/26/21 1740   11/04/21 1800  ceFEPIme (MAXIPIME) 2 g in sodium chloride 0.9 % 100 mL IVPB        2 g 200 mL/hr over 30 Minutes Intravenous Every M-W-F (1800) 11/03/21 0952 11/08/21 2359   11/04/21 1200  ceFEPIme (MAXIPIME) 2 g in sodium chloride 0.9 % 100 mL IVPB  Status:  Discontinued        2 g 200 mL/hr over 30 Minutes Intravenous Every M-W-F (Hemodialysis) 11/02/21 0941 11/03/21 0952   11/02/21 1800  ceFEPIme (MAXIPIME) 2 g in sodium chloride 0.9 % 100 mL IVPB        2 g 200 mL/hr over 30 Minutes Intravenous  Once 11/02/21 0941 11/02/21 1812   11/02/21 1200  ceFEPIme (MAXIPIME) 2 g in sodium chloride 0.9 % 100 mL IVPB  Status:  Discontinued        2 g 200 mL/hr over 30 Minutes Intravenous Every T-Th-Sa (Hemodialysis) 11/01/21 1934 11/02/21 0941   11/01/21 2200  metroNIDAZOLE (FLAGYL) tablet 500 mg        500 mg Oral Every 12 hours 11/01/21 1827 11/08/21 2225   11/01/21 1945  ceFEPIme (MAXIPIME) 1 g in sodium chloride 0.9 % 100 mL IVPB        1 g 200 mL/hr over 30 Minutes Intravenous  Once 11/01/21 1933 11/01/21 2032   11/01/21 1730  ceFEPIme (MAXIPIME) 1 g in sodium chloride 0.9 % 100 mL IVPB  Status:  Discontinued        1 g 200 mL/hr over 30 Minutes Intravenous Every 24 hours 11/01/21 1720 11/01/21 1933          Medications  aspirin EC  81 mg Oral Daily   atorvastatin  10 mg Oral Daily   [START ON 01/15/2022] darbepoetin (ARANESP) injection - DIALYSIS  200 mcg Intravenous Q Wed-HD   docusate sodium  100 mg Oral BID   feeding supplement  1 Container Oral TID BM   gabapentin  100 mg Oral Q1200   gabapentin  200 mg Oral QHS   heparin  5,000 Units Subcutaneous Q8H   levothyroxine  100 mcg Oral Q0600   loratadine  10 mg Oral Daily   lubiprostone  8 mcg Oral Q Tue   And    lubiprostone  8 mcg Oral Q Sat   midodrine  10 mg Oral Q M,W,F-HD   pantoprazole  40 mg Oral Daily   sevelamer carbonate  3.2 g Oral TID WC      Subjective:  Courtney Gray was seen and examined today.  No acute complaints, awaiting SNF.   Objective:   Vitals:   01/12/22 0909 01/12/22 2051 01/13/22 0522 01/13/22 0948  BP: (!) 122/51 127/63 134/71 (!) 134/59  Pulse: 73 77 80 80  Resp: 18 18 17 16   Temp: 98.5 F (36.9 C) 98.6 F (37 C) 98.2 F (36.8 C) 98.2 F (36.8 C)  TempSrc: Oral Oral  Oral  SpO2: 100% 99% 96% 96%  Weight:      Height:        Intake/Output Summary (Last 24 hours) at 01/13/2022 1218 Last data filed at 01/13/2022 0555 Gross per 24 hour  Intake 480 ml  Output 0 ml  Net 480 ml     Wt Readings from Last 3 Encounters:  10/21/21 (!) 144 kg  09/03/21 (!) 150.6 kg  08/28/21 (!) 150.1 kg   Physical Exam General: Alert and oriented x 3, NAD Cardiovascular: S1 S2 clear, RRR.  Respiratory: CTAB, no wheezing Gastrointestinal: Soft, nontender, nondistended, NBS Ext: bilateral AKA     Data Reviewed:  I have personally reviewed following labs    CBC Lab Results  Component Value Date   WBC 10.5 01/10/2022   RBC 2.56 (L) 01/10/2022   HGB 7.8 (L) 01/10/2022   HCT 25.5 (L) 01/10/2022   MCV 99.6 01/10/2022   MCH 30.5 01/10/2022   PLT 279 01/10/2022   MCHC 30.6 01/10/2022   RDW 14.7 01/10/2022   LYMPHSABS 1.6 01/08/2022   MONOABS 1.3 (H) 01/08/2022   EOSABS 0.7 (H) 01/08/2022   BASOSABS 0.1 16/01/9603     Last metabolic panel Lab Results  Component Value Date   NA 135 01/10/2022   K 5.3 (H) 01/10/2022   CL 96 (L) 01/10/2022   CO2 26 01/10/2022   BUN 42 (H) 01/10/2022   CREATININE 9.71 (H) 01/10/2022   GLUCOSE 96 01/10/2022   GFRNONAA 4 (L) 01/10/2022   GFRAA 6 (L) 11/29/2019   CALCIUM 9.7 01/10/2022   PHOS 6.8 (H) 01/10/2022   PROT 6.9 11/06/2021   ALBUMIN 2.6 (L) 01/10/2022   BILITOT 0.9 11/06/2021   ALKPHOS 62 11/06/2021    AST 28 11/06/2021   ALT 7 11/06/2021   ANIONGAP 13 01/10/2022     Radiology Studies: I have personally reviewed the imaging studies  No results found.     Estill Cotta M.D. [facility] 01/13/2022, 12:18 PM  Available via Epic secure chat 7am-7pm After 7 pm, please refer to night coverage provider listed on amion.

## 2022-01-13 NOTE — Progress Notes (Signed)
[facility] Progress Note   Subjective:    Seen and examined patient at bedside. She reports dealing with some constipation and loose stools over the weekend. Plan for HD today.  Objective Vitals:   01/12/22 0909 01/12/22 2051 01/13/22 0522 01/13/22 0948  BP: (!) 122/51 127/63 134/71 (!) 134/59  Pulse: 73 77 80 80  Resp: 18 18 17 16   Temp: 98.5 F (36.9 C) 98.6 F (37 C) 98.2 F (36.8 C) 98.2 F (36.8 C)  TempSrc: Oral Oral  Oral  SpO2: 100% 99% 96% 96%  Weight:      Height:       Physical Exam General:WDWN female in NAD Heart:RRR, no mrg Lungs:CTAB, nml WOB on RA Abdomen:soft, NTND Extremities:no LE edema Dialysis Access: [facility]   Filed Weights   01/08/22 0031 01/08/22 0400  Weight: 116.1 kg 115 kg    Intake/Output Summary (Last 24 hours) at 01/13/2022 1224 Last data filed at 01/13/2022 0555 Gross per 24 hour  Intake 480 ml  Output 0 ml  Net 480 ml    Additional Objective Labs: Basic Metabolic Panel: Recent Labs  Lab 01/06/22 1755 01/08/22 0738 01/10/22 1206  NA 136 136 135  K 5.8* 4.5 5.3*  CL 99 95* 96*  CO2 27 28 26   GLUCOSE 100* 85 96  BUN 31* 17 42*  CREATININE 7.53* 4.82* 9.71*  CALCIUM 10.1 9.4 9.7  PHOS  --  4.3 6.8*   Liver Function Tests: Recent Labs  Lab 01/08/22 0738 01/10/22 1206  ALBUMIN 2.7* 2.6*   No results for input(s): "LIPASE", "AMYLASE" in the last 168 hours. CBC: Recent Labs  Lab 01/06/22 1755 01/08/22 0738 01/10/22 1206  WBC 11.3* 10.9* 10.5  NEUTROABS  --  7.2  --   HGB 8.4* 8.5* 7.8*  HCT 27.8* 27.7* 25.5*  MCV 97.5 97.5 99.6  PLT 303 289 279   Blood Culture    Component Value Date/Time   SDES BLOOD LEFT ANTECUBITAL 10/16/2021 1534   SPECREQUEST  10/16/2021 1534    BOTTLES DRAWN AEROBIC AND ANAEROBIC Blood Culture results may not be optimal due to an inadequate volume of blood received in culture bottles   CULT  10/16/2021 1534    NO GROWTH 5 DAYS Performed at [facility] [address] [place], [place] [address]    REPTSTATUS 10/21/2021 FINAL 10/16/2021 1534    Cardiac Enzymes: No results for input(s): "CKTOTAL", "CKMB", "CKMBINDEX", "TROPONINI" in the last 168 hours. CBG: No results for input(s): "GLUCAP" in the last 168 hours. Iron Studies: No results for input(s): "IRON", "TIBC", "TRANSFERRIN", "FERRITIN" in the last 72 hours. Lab Results  Component Value Date   INR 1.3 (H) 10/09/2021   INR 1.3 (H) 09/10/2021   INR 1.3 (H) 07/10/2021   Studies/Results: No results found.  Medications:  anticoagulant sodium citrate      aspirin EC  81 mg Oral Daily   atorvastatin  10 mg Oral Daily   [START ON 01/15/2022] darbepoetin (ARANESP) injection - DIALYSIS  200 mcg Intravenous Q Wed-HD   docusate sodium  100 mg Oral BID   feeding supplement  1 Container Oral TID BM   gabapentin  100 mg Oral Q1200   gabapentin  200 mg Oral QHS   heparin  5,000 Units Subcutaneous Q8H   levothyroxine  100 mcg Oral Q0600   loratadine  10 mg Oral Daily   lubiprostone  8 mcg Oral Q Tue   And   lubiprostone  8 mcg  Oral Q Sat   midodrine  10 mg Oral Q M,W,F-HD   pantoprazole  40 mg Oral Daily   sevelamer carbonate  3.2 g Oral TID WC    Dialysis Orders: MWF - [facility]  4hrs, BFR 400, DFR 600,  AVF, EDW 146kg, 3K/ 2Ca - last hep B labs 8/21 - Heparin 2500 units IV with HD - Hectorol 90mcg IV q HD, held as of 10/30/21  Assessment/Plan: ESRD: on MWF. Next HD today per regular schedule. Check pre and post BUN to calculate URR for clearance to help in decision about possible reduction in treatment time.  TDC malfunction: Had required two [facility] exchanges and having issues again on 9/4, got overnight tPA dwell. IR thinks TDC is positional as no fibrin on tip at last exchange. Discussed the need for a long term solution, VVS consulted - plan was for RUE AVF 12/19/21 but was postponed. Unfortunately, R AVF placement was again postponed 10/2 d/t her not feeling well (c/o  frequent stools). Meds adjusted last week. Patient has dates she says work for her for surgery (10/19 and 10/23).  Per VVS last note she "would best be served with continued TDC use at this time". Reached out to Dr. Donzetta Matters to discuss rescheduling AVF procedure-awaiting response. Also recommended she try calling the office on Monday to see if she could get on their surgery schedule. Heparin boluses restarted with dialysis  Frequent loose stools: Improved. Lactulose d/c.  Stool softeners now prn. BP/volume: BP variable, was on low dose metoprolol and midodine -> metop discontinued on 9/1.  Continue Midodrine with HD.  Last hoyer weight was 118kg which is down significantly from OP EDW -> lower on discharge. BP variable.  Continue UF as tolerated. Chronic respiratory failure with hypoxia on cannula oxygen at home: on 5L O2 at baseline, have been able to titrate down O2 to 2-3L Anemia of ESRD: Last Hgb 7.8 - continue high dose ESA q Monday while here. Last tsat 16% on 9/14 with ferritin 1287, will getting short course of IV Fe. Transfuse < 7. Secondary hyperparathyroidism: CorrCa improving, VDRA on hold. Phos variable but overall improved, Renvela increased on 9/30 to 3.2g TID. 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 Severe sacral decubitus wound: Per primary/wound care team. Disposition: Difficult situation. Prior to admit, patient required 6 staff members and 2 hoyer pads for transfer into HD chair as OP. Could not stay on dialysis for full treatment because of pain of multiple decubitus ulcers and PTAR unable to lift pt into ambulance for transport. SW has been unable to locate stretcher dialysis location so far. Her weight is much lower from admit, asked PT about trying to hoyer to recliner for HD -> apparently would need hoyer that wraps around her legs and patient is only able to tolerate <1 hour sitting up at this point. Please see renal navigator note from 12/23/21. Renal navigator  has reached out to several out of state facilities to see if they have onsite stretcher dialysis - waiting for return calls.   Tobie Poet, NP [facility] 01/13/2022,12:24 PM  LOS: 73 days

## 2022-01-14 DIAGNOSIS — I5033 Acute on chronic diastolic (congestive) heart failure: Secondary | ICD-10-CM | POA: Diagnosis not present

## 2022-01-14 DIAGNOSIS — N183 Chronic kidney disease, stage 3 unspecified: Secondary | ICD-10-CM | POA: Diagnosis not present

## 2022-01-14 DIAGNOSIS — S68113A Complete traumatic metacarpophalangeal amputation of left middle finger, initial encounter: Secondary | ICD-10-CM | POA: Diagnosis not present

## 2022-01-14 DIAGNOSIS — N186 End stage renal disease: Secondary | ICD-10-CM | POA: Diagnosis not present

## 2022-01-14 MED ORDER — LIDOCAINE HCL (PF) 1 % IJ SOLN: 5.0000 mL | INTRAMUSCULAR | Status: DC | PRN

## 2022-01-14 MED ORDER — HEPARIN SODIUM (PORCINE) 1000 UNIT/ML DIALYSIS: 1000.0000 [IU] | INTRAMUSCULAR | Status: DC | PRN

## 2022-01-14 MED ORDER — HEPARIN SODIUM (PORCINE) 1000 UNIT/ML DIALYSIS
3000.0000 [IU] | Freq: Once | INTRAMUSCULAR | Status: DC
  Filled 2022-01-14 (×2): qty 3

## 2022-01-14 MED ORDER — ALTEPLASE 2 MG IJ SOLR: 2.0000 mg | Freq: Once | INTRAMUSCULAR | Status: DC | PRN

## 2022-01-14 MED ORDER — PENTAFLUOROPROP-TETRAFLUOROETH EX AERO: 1.0000 | INHALATION_SPRAY | CUTANEOUS | Status: DC | PRN

## 2022-01-14 MED ORDER — LIDOCAINE-PRILOCAINE 2.5-2.5 % EX CREA: 1.0000 | TOPICAL_CREAM | CUTANEOUS | Status: DC | PRN

## 2022-01-14 MED ORDER — HEPARIN SODIUM (PORCINE) 5000 UNIT/ML IJ SOLN
5000.0000 [IU] | Freq: Three times a day (TID) | INTRAMUSCULAR | Status: DC
  Administered 2022-01-14 – 2022-02-02 (×46): 5000 [IU] via SUBCUTANEOUS
  Filled 2022-01-14 (×46): qty 1

## 2022-01-14 NOTE — Progress Notes (Signed)
[facility]                                                                              Courtney Gray, is a 51 y.o. female, DOB - August 03, 1970, DEY:[id number] Admit date - 11/01/2021    Outpatient Primary MD for the patient is Monico Blitz, MD  LOS - 75  days  Chief Complaint  Patient presents with   Vascular Access Problem       Brief summary   Patient is a 51 year old female with ESRD on HD TTS, diastolic CHF, PVD status post bilateral AKA (right in May, left on 7/5), IDDM, morbid obesity, chronic sacral wounds admitted on 7/28, presented with progressive shortness of breath in the setting of missed the dialysis, admitted for hypertensive emergency.  According to outpatient HD records, takes at least 4 staff members and [facility] lift for patient to be seated in dialysis chair. Patient was found to be in fluid overload, underwent R UE venogram on 8/30.  10/10: Awaiting placement where she can get the HD on a stretcher, difficult placement  Assessment & Plan    Principal Problem:   End stage renal disease ([place]) on hemodialysis, MWF -HD per nephrology.  - Per outpatient HD records takes at least 4 staff members and avoid limit for patient to be seated in dialysis chair -Patient has been awaiting facility where she can get the HD on a stretcher.  TOC continues to follow. -TOC and renal navigator assisting with SNF facilities for onsite HD via stretcher and so far unsuccessful.  Continue PT. -Awaiting placement  Active Problems: TDC malfunction -Had required 2 TDC exchanges and issues again on 9/4.  Plan was for RUE AVF on 9/14 but was postponed, then postponed again on 10/3 due to her refusal secondary to not feeling well -Vascular surgery following, recommended continue TDC use at this time  Acute on chronic diastolic CHF/volume overload/hypertensive emergency -Continue volume management with HD -BP stable, Imdur, metoprolol on hold -Continue midodrine 10 mg  Monday Wednesday Friday  Chronic respiratory failure with hypoxia -On O2 3 L at baseline -Currently O2 sats 96% on room air  Anemia of chronic kidney disease/ESRD -ESA per nephrology, monitor H&H  Diabetes mellitus type 2, IDDM, controlled with underlying ESRD -Recently had been on glipizide which was discontinued -Hemoglobin A1c 5.3%   Recent septic shock secondary to Bacteroides, bacteremia and liver, and splenic abscess -Completed antibiotic course    PAD status post bilateral AKA -Continue aspirin, statin  Hyperlipidemia -Continue statin   IBS with constipation, intractable nausea and vomiting -Patient had constipation, Amitiza was added, subsequently had diarrhea and the stool softeners were changed to as needed -continue Amitiza to Tuesday and Saturday, nondialysis days, Colace twice daily.  Pressure injury documentation Right hip unstageable, POA Left lower flank, stage II  Obesity Estimated body mass index is 42.19 kg/m as calculated from the following:   Height as of this encounter: 5\' 5"  (1.651 m).   Weight as of this encounter: 115 kg.  Code Status: Full code DVT Prophylaxis:  heparin injection 5,000 Units Start: 12/01/21 1400   Level of Care: Level of care: Med-Surg  Family Communication: Updated patient  Disposition Plan:      Remains inpatient appropriate: Difficult placement, needs dialysis unit outpatient with stretcher, TOC and renal navigator assisting  Procedures:  HD  Consultants:   Nephrology Vascular surgery  Antimicrobials:   Anti-infectives (From admission, onward)    Start     Dose/Rate Route Frequency Ordered Stop   01/06/22 0600  vancomycin (VANCOCIN) IVPB 1000 mg/200 mL premix        1,000 mg 200 mL/hr over 60 Minutes Intravenous To Surgery 01/05/22 0709 01/07/22 0600   11/28/21 1215  vancomycin (VANCOCIN) IVPB 1000 mg/200 mL premix        1,000 mg 200 mL/hr over 60 Minutes Intravenous  Once 11/28/21 1117 11/28/21 1512    11/26/21 1000  vancomycin (VANCOCIN) IVPB 1000 mg/200 mL premix        1,000 mg 200 mL/hr over 60 Minutes Intravenous On call 11/25/21 1628 11/26/21 1740   11/04/21 1800  ceFEPIme (MAXIPIME) 2 g in sodium chloride 0.9 % 100 mL IVPB        2 g 200 mL/hr over 30 Minutes Intravenous Every M-W-F (1800) 11/03/21 0952 11/08/21 2359   11/04/21 1200  ceFEPIme (MAXIPIME) 2 g in sodium chloride 0.9 % 100 mL IVPB  Status:  Discontinued        2 g 200 mL/hr over 30 Minutes Intravenous Every M-W-F (Hemodialysis) 11/02/21 0941 11/03/21 0952   11/02/21 1800  ceFEPIme (MAXIPIME) 2 g in sodium chloride 0.9 % 100 mL IVPB        2 g 200 mL/hr over 30 Minutes Intravenous  Once 11/02/21 0941 11/02/21 1812   11/02/21 1200  ceFEPIme (MAXIPIME) 2 g in sodium chloride 0.9 % 100 mL IVPB  Status:  Discontinued        2 g 200 mL/hr over 30 Minutes Intravenous Every T-Th-Sa (Hemodialysis) 11/01/21 1934 11/02/21 0941   11/01/21 2200  metroNIDAZOLE (FLAGYL) tablet 500 mg        500 mg Oral Every 12 hours 11/01/21 1827 11/08/21 2225   11/01/21 1945  ceFEPIme (MAXIPIME) 1 g in sodium chloride 0.9 % 100 mL IVPB        1 g 200 mL/hr over 30 Minutes Intravenous  Once 11/01/21 1933 11/01/21 2032   11/01/21 1730  ceFEPIme (MAXIPIME) 1 g in sodium chloride 0.9 % 100 mL IVPB  Status:  Discontinued        1 g 200 mL/hr over 30 Minutes Intravenous Every 24 hours 11/01/21 1720 11/01/21 1933          Medications  aspirin EC  81 mg Oral Daily   atorvastatin  10 mg Oral Daily   [START ON 01/15/2022] darbepoetin (ARANESP) injection - DIALYSIS  200 mcg Intravenous Q Wed-HD   docusate sodium  100 mg Oral BID   feeding supplement  1 Container Oral TID BM   gabapentin  100 mg Oral Q1200   gabapentin  200 mg Oral QHS   heparin  5,000 Units Subcutaneous Q8H   levothyroxine  100 mcg Oral Q0600   loratadine  10 mg Oral Daily   lubiprostone  8 mcg Oral Q Tue   And   lubiprostone  8 mcg Oral Q Sat   midodrine  10 mg Oral Q  M,W,F-HD   pantoprazole  40 mg Oral Daily   sevelamer carbonate  3.2 g Oral TID WC      Subjective:   Courtney Gray was seen and examined today.  No complaints today,  resting.  Awaiting SNF.    Objective:   Vitals:   01/13/22 1730 01/13/22 2146 01/14/22 0624 01/14/22 0900  BP: (!) 110/45 (!) 155/72 (!) 132/50 (!) 142/53  Pulse: 94 82 75 69  Resp: 18 18 18 17   Temp: 98.1 F (36.7 C) 97.8 F (36.6 C) 98.8 F (37.1 C) 98.3 F (36.8 C)  TempSrc: Oral Axillary Oral   SpO2: 100% 100% 100% 100%  Weight:      Height:        Intake/Output Summary (Last 24 hours) at 01/14/2022 1123 Last data filed at 01/14/2022 0400 Gross per 24 hour  Intake --  Output 0 ml  Net 0 ml     Wt Readings from Last 3 Encounters:  10/21/21 (!) 144 kg  09/03/21 (!) 150.6 kg  08/28/21 (!) 150.1 kg    Physical Exam General: Alert and oriented x 3, NAD Cardiovascular: S1 S2 clear, RRR.  Respiratory: CTAB, no wheezing, rales or rhonchi Gastrointestinal: Soft, nontender, nondistended, NBS Ext: B/L AKA Psych: Normal affect and demeanor    Data Reviewed:  I have personally reviewed following labs    CBC Lab Results  Component Value Date   WBC 10.0 01/13/2022   RBC 2.53 (L) 01/13/2022   HGB 7.6 (L) 01/13/2022   HCT 25.2 (L) 01/13/2022   MCV 99.6 01/13/2022   MCH 30.0 01/13/2022   PLT 317 01/13/2022   MCHC 30.2 01/13/2022   RDW 15.6 (H) 01/13/2022   LYMPHSABS 1.6 01/08/2022   MONOABS 1.3 (H) 01/08/2022   EOSABS 0.7 (H) 01/08/2022   BASOSABS 0.1 09/62/8366     Last metabolic panel Lab Results  Component Value Date   NA 138 01/13/2022   K 6.0 (H) 01/13/2022   CL 101 01/13/2022   CO2 27 01/13/2022   BUN 44 (H) 01/13/2022   CREATININE 9.81 (H) 01/13/2022   GLUCOSE 91 01/13/2022   GFRNONAA 4 (L) 01/13/2022   GFRAA 6 (L) 11/29/2019   CALCIUM 9.8 01/13/2022   PHOS 6.5 (H) 01/13/2022   PROT 6.9 11/06/2021   ALBUMIN 2.5 (L) 01/13/2022   BILITOT 0.9 11/06/2021   ALKPHOS 62  11/06/2021   AST 28 11/06/2021   ALT 7 11/06/2021   ANIONGAP 10 01/13/2022     Radiology Studies: I have personally reviewed the imaging studies  No results found.     Estill Cotta M.D. [facility] 01/14/2022, 11:23 AM  Available via Epic secure chat 7am-7pm After 7 pm, please refer to night coverage provider listed on amion.

## 2022-01-14 NOTE — Progress Notes (Signed)
Navigator has not received any return calls from inquiries to snf out of state that may provide on-site HD. Message left again today requesting a return call.   Melven Sartorius Renal Navigator [phone]

## 2022-01-14 NOTE — Progress Notes (Signed)
Orthopedic Tech Progress Note Patient Details:  Courtney Gray 27-Jul-1970 [id number]  Called in order to [facility] for BLE AKA STUMP SHRINKERS   Patient ID: Courtney Gray, female   DOB: September 17, 1970, 51 y.o.   MRN: [id number]  [facility]ney Gray 01/14/2022, 5:47 PM

## 2022-01-14 NOTE — Progress Notes (Signed)
TR[facility] night cross cover note:   I was contacted by RN with request for clarification regarding the patient's DVT prophylaxis.  Patient with a prolonged hospital course, currently day 63, who underwent bilateral AKA on 11/01/2021.  Was previously on heparin 5000 units subcu 3 times daily for DVT prophylaxis.  However, RN conveys that there is no longer an active order for ongoing TID heparin injections for this purpose.  Per my chart review, including review of most recent rounding hospitalist progress note,  the current plan regarding DVT prophylaxis is for heparin 5000 units SQ 3 times daily starting on 12/01/21.   I subsequently placed order for heparin 5000 units SQ 3 times daily, with first dose now, and communicated this to the patient's RN.    Babs Bertin, DO Hospitalist

## 2022-01-14 NOTE — Progress Notes (Signed)
Physical Therapy Treatment Patient Details Name: Courtney Gray MRN: [id number] DOB: 1971/03/23 Today's Date: 01/14/2022   History of Present Illness Pt adm 7/28 with acute pulmonary edema. 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    PT Comments    Patient reported having a rough night/day but willing to work with therapy. Using maxisky and +2 assist, lifted pt to wheelchair with Roho cushion centered under right ischium and doubled-over blue cushion in the remaining seat of bariatric wheelchair. Despite this being the same set-up as previous times in the chair, pt reporting significant wound pain. Attempted to modify how she was sitting and add padding under lateral thigh with no success. Unable to tolerate any propulsion in wheelchair this date and pt lifted back to bed. Repositioned in bed for comfort with multiple pillows under and behind her.     Recommendations for follow up therapy are one component of a multi-disciplinary discharge planning process, led by the attending physician.  Recommendations may be updated based on patient status, additional functional criteria and insurance authorization.  Follow Up Recommendations  Skilled nursing-short term rehab (<3 hours/day) Can patient physically be transported by private vehicle: No   Assistance Recommended at Discharge Frequent or constant Supervision/Assistance  Patient can return home with the following Two people to help with walking and/or transfers;Two people to help with bathing/dressing/bathroom;Assist for transportation;Assistance with cooking/housework;Help with stairs or ramp for entrance   Equipment Recommendations  Wheelchair (measurements PT);Wheelchair cushion (measurements PT);Other (comment);Hospital bed (Needs wide w/c with cushion and anti-tippers with ?offset wheels to reduce risk of tipping backwards)    Recommendations for  Other Services       Precautions / Restrictions Precautions Precautions: Fall Precaution Comments: bil AKA, 2L O2 at baseline, LUQ drain Restrictions Weight Bearing Restrictions: No RUE Weight Bearing: Weight bearing as tolerated LUE Weight Bearing: Weight bearing as tolerated RLE Weight Bearing: Non weight bearing LLE Weight Bearing: Non weight bearing     Mobility  Bed Mobility Overal bed mobility: Needs Assistance Bed Mobility: Rolling Rolling: Supervision, Mod assist         General bed mobility comments: using rails; prior to OOB supervision for placing pad; when removing pad pt with incr pain and required assist to fully roll    Transfers Overall transfer level: Needs assistance Equipment used:  (maxisky) Transfers: Bed to chair/wheelchair/BSC             General transfer comment: lifted to and from wheelchair with [employer] and amputee pad and +2 assist Transfer via Lift Equipment: Ernest (with amputee lift pad)  Ambulation/Gait               General Gait Details: unable   [name], [name] (indicate cue type and reason): pt unable to tolerate sitting on wound in wheelchair despite same set up with cushions, pads, etc.  Modified Rankin (Stroke Patients Only)       Balance Overall balance assessment: Needs assistance Sitting-balance support: No upper extremity supported Sitting balance-[name] Scale: Good Sitting balance - Comments: in wheelchair; pt requests chest strap (made with gait belts) due to fear of falling  Cognition Arousal/Alertness: Awake/alert Behavior During Therapy: WFL for tasks assessed/performed Overall Cognitive Status: Within Functional Limits for tasks assessed                                          Exercises      General Comments General comments (skin integrity, edema,  etc.): Increased time assisting pt to position in bed with pillows due to incr pain      Pertinent Vitals/Pain Pain Assessment Pain Assessment: 0-10 Pain Score: 8  Pain Location: rt ischial wound Pain Descriptors / Indicators: Guarding, Sore, Moaning Pain Intervention(s): Limited activity within patient's tolerance, Monitored during session, Repositioned    Home Living                          Prior Function            PT Goals (current goals can now be found in the care plan section) Acute Rehab PT Goals Patient Stated Goal: Pt wants to get up into a w/c Time For Goal Achievement: 01/23/22 Potential to Achieve Goals: Fair Progress towards PT goals: Not progressing toward goals - comment (this session due to pain)    Frequency    Min 2X/week      PT Plan Current plan remains appropriate    Co-evaluation              AM-PAC PT "6 Clicks" Mobility   Outcome Measure  Help needed turning from your back to your side while in a flat bed without using bedrails?: A Little Help needed moving from lying on your back to sitting on the side of a flat bed without using bedrails?: A Lot Help needed moving to and from a bed to a chair (including a wheelchair)?: Total Help needed standing up from a chair using your arms (e.g., wheelchair or bedside chair)?: Total Help needed to walk in hospital room?: Total Help needed climbing 3-5 steps with a railing? : Total 6 Click Score: 9    End of Session Equipment Utilized During Treatment: Oxygen Activity Tolerance: Patient limited by pain Patient left: with call bell/phone within reach;in bed Nurse Communication: Mobility status;Patient requests pain meds PT Visit Diagnosis: Other abnormalities of gait and mobility (R26.89);Muscle weakness (generalized) (M62.81) Pain - Right/Left: Left Pain - part of body: Leg (ischial wound)     Time: 6010-9323 PT Time Calculation (min) (ACUTE ONLY): 58 min  Charges:   $Therapeutic Activity: 53-67 mins                      Arby Barrette, PT Acute Rehabilitation Services  Office [phone]    [name] 01/14/2022, 4:31 PM

## 2022-01-14 NOTE — Progress Notes (Signed)
[facility] Progress Note   Subjective:    Seen and examined patient at bedside. Tolerated yesterday's HD with net UF 3L. No acute complaints. Next HD 10/11.  Objective Vitals:   01/13/22 1730 01/13/22 2146 01/14/22 0624 01/14/22 0900  BP: (!) 110/45 (!) 155/72 (!) 132/50 (!) 142/53  Pulse: 94 82 75 69  Resp: 18 18 18 17   Temp: 98.1 F (36.7 C) 97.8 F (36.6 C) 98.8 F (37.1 C) 98.3 F (36.8 C)  TempSrc: Oral Axillary Oral   SpO2: 100% 100% 100% 100%  Weight:      Height:       Physical Exam General:WDWN female in NAD Heart:RRR, no mrg Lungs:CTAB, nml WOB on RA Abdomen:soft, NTND Extremities:no LE edema Dialysis Access: [facility]   Filed Weights   01/08/22 0031 01/08/22 0400  Weight: 116.1 kg 115 kg    Intake/Output Summary (Last 24 hours) at 01/14/2022 1413 Last data filed at 01/14/2022 0400 Gross per 24 hour  Intake --  Output 0 ml  Net 0 ml    Additional Objective Labs: Basic Metabolic Panel: Recent Labs  Lab 01/08/22 0738 01/10/22 1206 01/13/22 1300  NA 136 135 138  K 4.5 5.3* 6.0*  CL 95* 96* 101  CO2 28 26 27   GLUCOSE 85 96 91  BUN 17 42* 44*  CREATININE 4.82* 9.71* 9.81*  CALCIUM 9.4 9.7 9.8  PHOS 4.3 6.8* 6.5*   Liver Function Tests: Recent Labs  Lab 01/08/22 0738 01/10/22 1206 01/13/22 1300  ALBUMIN 2.7* 2.6* 2.5*   No results for input(s): "LIPASE", "AMYLASE" in the last 168 hours. CBC: Recent Labs  Lab 01/08/22 0738 01/10/22 1206 01/13/22 1300  WBC 10.9* 10.5 10.0  NEUTROABS 7.2  --   --   HGB 8.5* 7.8* 7.6*  HCT 27.7* 25.5* 25.2*  MCV 97.5 99.6 99.6  PLT 289 279 317   Blood Culture    Component Value Date/Time   SDES BLOOD LEFT ANTECUBITAL 10/16/2021 1534   SPECREQUEST  10/16/2021 1534    BOTTLES DRAWN AEROBIC AND ANAEROBIC Blood Culture results may not be optimal due to an inadequate volume of blood received in culture bottles   CULT  10/16/2021 1534    NO GROWTH 5 DAYS Performed at [facility] [address], [place], [place] [address]    REPTSTATUS 10/21/2021 FINAL 10/16/2021 1534    Cardiac Enzymes: No results for input(s): "CKTOTAL", "CKMB", "CKMBINDEX", "TROPONINI" in the last 168 hours. CBG: No results for input(s): "GLUCAP" in the last 168 hours. Iron Studies: No results for input(s): "IRON", "TIBC", "TRANSFERRIN", "FERRITIN" in the last 72 hours. Lab Results  Component Value Date   INR 1.3 (H) 10/09/2021   INR 1.3 (H) 09/10/2021   INR 1.3 (H) 07/10/2021   Studies/Results: No results found.  Medications:   aspirin EC  81 mg Oral Daily   atorvastatin  10 mg Oral Daily   [START ON 01/15/2022] darbepoetin (ARANESP) injection - DIALYSIS  200 mcg Intravenous Q Wed-HD   docusate sodium  100 mg Oral BID   feeding supplement  1 Container Oral TID BM   gabapentin  100 mg Oral Q1200   gabapentin  200 mg Oral QHS   heparin  5,000 Units Subcutaneous Q8H   levothyroxine  100 mcg Oral Q0600   loratadine  10 mg Oral Daily   lubiprostone  8 mcg Oral Q Tue   And   lubiprostone  8 mcg Oral Q Sat   midodrine  10  mg Oral Q M,W,F-HD   pantoprazole  40 mg Oral Daily   sevelamer carbonate  3.2 g Oral TID WC    Dialysis Orders: MWF - [facility]  4hrs, BFR 400, DFR 600,  AVF, EDW 146kg, 3K/ 2Ca - last hep B labs 8/21 - Heparin 2500 units IV with HD - Hectorol 54mcg IV q HD, held as of 10/30/21  Assessment/Plan: ESRD: on MWF. Next HD 10/11 per regular schedule. Check pre and post BUN to calculate URR for clearance to help in decision about possible reduction in treatment time. Last K+ 6 (pre-HD). Checking labs in AM. [facility] malfunction: Had required two [facility] exchanges and having issues again on 9/4, received overnight tPA dwell. IR thinks TDC is positional as no fibrin on tip at last exchange. Discussed the need for a long term solution. VVS consulted. Discussed with Dr. Donzetta Matters 10/9: I was informed of patient postponing AVF placement multiple times, despite VVS providing  patient with dates so her daughter can be present. Per VVS last note (10/2), she "would best be served with continued TDC use at this time". Will need to address AVF placement in the outpatient setting for now. Heparin boluses restarted with dialysis  Frequent loose stools: Improved. Lactulose d/c.  Stool softeners now prn. BP/volume: BP variable, was on low dose metoprolol and midodine -> metop discontinued on 9/1.  Continue Midodrine with HD.  Last hoyer weight was 118kg which is down significantly from OP EDW -> lower on discharge. BP variable.  Continue UF as tolerated. Chronic respiratory failure with hypoxia on cannula oxygen at home: on 5L O2 at baseline, have been able to titrate down O2 to 2-3L Anemia of ESRD: Last Hgb 7.8 - continue high dose ESA q Monday while here. Last tsat 16% on 9/14 with ferritin 1287, will getting short course of IV Fe. Transfuse < 7. Checking labs in AM Secondary hyperparathyroidism: CorrCa improving, VDRA on hold. Phos variable but overall improved, Renvela increased on 9/30 to 3.2g TID. 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 Severe sacral decubitus wound: Per primary/wound care team. Disposition: Difficult situation. Prior to admit, patient required 6 staff members and 2 hoyer pads for transfer into HD chair as OP. Could not stay on dialysis for full treatment because of pain of multiple decubitus ulcers and PTAR unable to lift pt into ambulance for transport. SW has been unable to locate stretcher dialysis location so far. Her weight is much lower from admit, asked PT about trying to hoyer to recliner for HD -> apparently would need hoyer that wraps around her legs and patient is only able to tolerate <1 hour sitting up at this point. Discussed with PT today (10/10): although she is making progress, the main issue is her Rt ischial wound. PT is using a high-quality air cushion from the CIR unit. This remains to be very painful for her to sit.  Please see renal navigator note from 12/23/21. Renal navigator has reached out to several out of state facilities to see if they have onsite stretcher dialysis - waiting for return calls.    Tobie Poet, NP [facility] 01/14/2022,2:13 PM  LOS: 74 days

## 2022-01-15 DIAGNOSIS — I5033 Acute on chronic diastolic (congestive) heart failure: Secondary | ICD-10-CM | POA: Diagnosis not present

## 2022-01-15 DIAGNOSIS — N186 End stage renal disease: Secondary | ICD-10-CM | POA: Diagnosis not present

## 2022-01-15 DIAGNOSIS — S68113A Complete traumatic metacarpophalangeal amputation of left middle finger, initial encounter: Secondary | ICD-10-CM | POA: Diagnosis not present

## 2022-01-15 DIAGNOSIS — N183 Chronic kidney disease, stage 3 unspecified: Secondary | ICD-10-CM | POA: Diagnosis not present

## 2022-01-15 LAB — RENAL FUNCTION PANEL
Albumin: 2.6 g/dL — ABNORMAL LOW (ref 3.5–5.0)
Anion gap: 10 (ref 5–15)
BUN: 35 mg/dL — ABNORMAL HIGH (ref 6–20)
CO2: 26 mmol/L (ref 22–32)
Calcium: 9.2 mg/dL (ref 8.9–10.3)
Chloride: 101 mmol/L (ref 98–111)
Creatinine, Ser: 7.67 mg/dL — ABNORMAL HIGH (ref 0.44–1.00)
GFR, Estimated: 6 mL/min — ABNORMAL LOW (ref >60)
Glucose, Bld: 130 mg/dL — ABNORMAL HIGH (ref 70–99)
Phosphorus: 7.4 mg/dL — ABNORMAL HIGH (ref 2.5–4.6)
Potassium: 5.4 mmol/L — ABNORMAL HIGH (ref 3.5–5.1)
Sodium: 137 mmol/L (ref 135–145)

## 2022-01-15 LAB — CBC
HCT: 25.2 % — ABNORMAL LOW (ref 36.0–46.0)
Hemoglobin: 7.6 g/dL — ABNORMAL LOW (ref 12.0–15.0)
MCH: 30.3 pg (ref 26.0–34.0)
MCHC: 30.2 g/dL (ref 30.0–36.0)
MCV: 100.4 fL — ABNORMAL HIGH (ref 80.0–100.0)
Platelets: 267 10*3/uL (ref 150–400)
RBC: 2.51 MIL/uL — ABNORMAL LOW (ref 3.87–5.11)
RDW: 15 % (ref 11.5–15.5)
WBC: 8.5 10*3/uL (ref 4.0–10.5)
nRBC: 0 % (ref 0.0–0.2)

## 2022-01-15 MED ORDER — HEPARIN SODIUM (PORCINE) 1000 UNIT/ML IJ SOLN
INTRAMUSCULAR | Status: AC
  Filled 2022-01-15: qty 5

## 2022-01-15 MED ORDER — HEPARIN SODIUM (PORCINE) 1000 UNIT/ML IJ SOLN
2500.0000 [IU] | Freq: Once | INTRAMUSCULAR | Status: AC
  Administered 2022-01-18: 2500 [IU] via INTRAVENOUS

## 2022-01-15 NOTE — Progress Notes (Signed)
[facility]                                                                              Courtney Gray, is a 51 y.o. female, DOB - [birth year], GDJ:[id number] Admit date - 11/01/2021    Outpatient Primary MD for the patient is Monico Blitz, MD  LOS - 88  days  Chief Complaint  Patient presents with   Vascular Access Problem       Brief summary   Patient is a 51 year old female with ESRD on HD TTS, diastolic CHF, PVD status post bilateral AKA (right in May, left on 7/5), IDDM, morbid obesity, chronic sacral wounds admitted on 7/28, presented with progressive shortness of breath in the setting of missed the dialysis, admitted for hypertensive emergency.  According to outpatient HD records, takes at least 4 staff members and [facility] lift for patient to be seated in dialysis chair. Patient was found to be in fluid overload, underwent R UE venogram on 8/30.  10/11: Awaiting placement where she can get the HD on a stretcher, difficult placement  Assessment & Plan    Principal Problem:   End stage renal disease ([place]) on hemodialysis, MWF -HD per nephrology.  - Per outpatient HD records takes at least 4 staff members and avoid limit for patient to be seated in dialysis chair -Patient has been awaiting facility where she can get the HD on a stretcher.  TOC continues to follow. -TOC and renal navigator assisting with SNF facilities for onsite HD via stretcher and so far unsuccessful.  Continue PT. -Awaiting placement  Active Problems: TDC malfunction -Had required 2 TDC exchanges and issues again on 9/4.  Plan was for RUE AVF on 9/14 but was postponed, then postponed again on 10/3 due to her refusal secondary to not feeling well -Vascular surgery following, recommended continue TDC use at this time  Acute on chronic diastolic CHF/volume overload/hypertensive emergency -Continue volume management with HD -BP stable, Imdur, metoprolol on hold -Continue midodrine 10 mg  Monday Wednesday Friday  Chronic respiratory failure with hypoxia -On O2 3 L at baseline -Currently on room air  Anemia of chronic kidney disease/ESRD -ESA per nephrology, monitor H&H  Diabetes mellitus type 2, IDDM, controlled with underlying ESRD -Recently had been on glipizide which was discontinued -Hemoglobin A1c 5.3%   Recent septic shock secondary to Bacteroides, bacteremia and liver, and splenic abscess -Completed antibiotic course    PAD status post bilateral AKA -Continue aspirin, statin  Hyperlipidemia -Continue statin   IBS with constipation, intractable nausea and vomiting -Patient had constipation, Amitiza was added, subsequently had diarrhea and the stool softeners were changed to as needed -continue Amitiza to Tuesday and Saturday, nondialysis days, Colace twice daily.  Pressure injury documentation Right hip unstageable, POA Left lower flank, stage II  Obesity Estimated body mass index is 42.19 kg/m as calculated from the following:   Height as of this encounter: 5\' 5"  (1.651 m).   Weight as of this encounter: 115 kg.  Code Status: Full code DVT Prophylaxis:  heparin injection 5,000 Units Start: 01/14/22 2215   Level of Care: Level of care: Med-Surg Family Communication: Updated  patient  Disposition Plan:      Remains inpatient appropriate: Difficult placement, needs dialysis unit outpatient with stretcher, TOC and renal navigator assisting  Procedures:  HD  Consultants:   Nephrology Vascular surgery  Antimicrobials:   Anti-infectives (From admission, onward)    Start     Dose/Rate Route Frequency Ordered Stop   01/06/22 0600  vancomycin (VANCOCIN) IVPB 1000 mg/200 mL premix        1,000 mg 200 mL/hr over 60 Minutes Intravenous To Surgery 01/05/22 0709 01/07/22 0600   11/28/21 1215  vancomycin (VANCOCIN) IVPB 1000 mg/200 mL premix        1,000 mg 200 mL/hr over 60 Minutes Intravenous  Once 11/28/21 1117 11/28/21 1512   11/26/21 1000   vancomycin (VANCOCIN) IVPB 1000 mg/200 mL premix        1,000 mg 200 mL/hr over 60 Minutes Intravenous On call 11/25/21 1628 11/26/21 1740   11/04/21 1800  ceFEPIme (MAXIPIME) 2 g in sodium chloride 0.9 % 100 mL IVPB        2 g 200 mL/hr over 30 Minutes Intravenous Every M-W-F (1800) 11/03/21 0952 11/08/21 2359   11/04/21 1200  ceFEPIme (MAXIPIME) 2 g in sodium chloride 0.9 % 100 mL IVPB  Status:  Discontinued        2 g 200 mL/hr over 30 Minutes Intravenous Every M-W-F (Hemodialysis) 11/02/21 0941 11/03/21 0952   11/02/21 1800  ceFEPIme (MAXIPIME) 2 g in sodium chloride 0.9 % 100 mL IVPB        2 g 200 mL/hr over 30 Minutes Intravenous  Once 11/02/21 0941 11/02/21 1812   11/02/21 1200  ceFEPIme (MAXIPIME) 2 g in sodium chloride 0.9 % 100 mL IVPB  Status:  Discontinued        2 g 200 mL/hr over 30 Minutes Intravenous Every T-Th-Sa (Hemodialysis) 11/01/21 1934 11/02/21 0941   11/01/21 2200  metroNIDAZOLE (FLAGYL) tablet 500 mg        500 mg Oral Every 12 hours 11/01/21 1827 11/08/21 2225   11/01/21 1945  ceFEPIme (MAXIPIME) 1 g in sodium chloride 0.9 % 100 mL IVPB        1 g 200 mL/hr over 30 Minutes Intravenous  Once 11/01/21 1933 11/01/21 2032   11/01/21 1730  ceFEPIme (MAXIPIME) 1 g in sodium chloride 0.9 % 100 mL IVPB  Status:  Discontinued        1 g 200 mL/hr over 30 Minutes Intravenous Every 24 hours 11/01/21 1720 11/01/21 1933          Medications  aspirin EC  81 mg Oral Daily   atorvastatin  10 mg Oral Daily   darbepoetin (ARANESP) injection - DIALYSIS  200 mcg Intravenous Q Wed-HD   docusate sodium  100 mg Oral BID   feeding supplement  1 Container Oral TID BM   gabapentin  100 mg Oral Q1200   gabapentin  200 mg Oral QHS   heparin  3,000 Units Dialysis Once in dialysis   heparin injection (subcutaneous)  5,000 Units Subcutaneous Q8H   levothyroxine  100 mcg Oral Q0600   loratadine  10 mg Oral Daily   lubiprostone  8 mcg Oral Q Tue   And   lubiprostone  8 mcg Oral  Q Sat   midodrine  10 mg Oral Q M,W,F-HD   pantoprazole  40 mg Oral Daily   sevelamer carbonate  3.2 g Oral TID WC      Subjective:   Courtney Gray was seen and  examined today.  No complaints per the patient, awaiting HD today.  No fevers, no acute issues overnight.  Awaiting SNF  Objective:   Vitals:   01/14/22 1721 01/14/22 2200 01/15/22 0629 01/15/22 0940  BP: (!) 136/58 137/68 (!) 111/37 (!) 121/47  Pulse: 71 85 71 81  Resp: 18 18 18 19   Temp: 98.2 F (36.8 C) 98.7 F (37.1 C) 97.9 F (36.6 C) 97.8 F (36.6 C)  TempSrc:  Oral Oral Oral  SpO2: 99% 96% 100% (!) 84%  Weight:      Height:        Intake/Output Summary (Last 24 hours) at 01/15/2022 1258 Last data filed at 01/15/2022 0000 Gross per 24 hour  Intake 320 ml  Output 0 ml  Net 320 ml     Wt Readings from Last 3 Encounters:  10/21/21 (!) 144 kg  09/03/21 (!) 150.6 kg  08/28/21 (!) 150.1 kg   Physical Exam General: Alert and oriented x 3, NAD Cardiovascular: S1 S2 clear, RRR.  Respiratory: CTAB, no wheezing Gastrointestinal: Soft, nontender, nondistended, NBS Ext: bilateral AKA Psych: Normal affect    Data Reviewed:  I have personally reviewed following labs    CBC Lab Results  Component Value Date   WBC 10.0 01/13/2022   RBC 2.53 (L) 01/13/2022   HGB 7.6 (L) 01/13/2022   HCT 25.2 (L) 01/13/2022   MCV 99.6 01/13/2022   MCH 30.0 01/13/2022   PLT 317 01/13/2022   MCHC 30.2 01/13/2022   RDW 15.6 (H) 01/13/2022   LYMPHSABS 1.6 01/08/2022   MONOABS 1.3 (H) 01/08/2022   EOSABS 0.7 (H) 01/08/2022   BASOSABS 0.1 84/69/6295     Last metabolic panel Lab Results  Component Value Date   NA 138 01/13/2022   K 6.0 (H) 01/13/2022   CL 101 01/13/2022   CO2 27 01/13/2022   BUN 44 (H) 01/13/2022   CREATININE 9.81 (H) 01/13/2022   GLUCOSE 91 01/13/2022   GFRNONAA 4 (L) 01/13/2022   GFRAA 6 (L) 11/29/2019   CALCIUM 9.8 01/13/2022   PHOS 6.5 (H) 01/13/2022   PROT 6.9 11/06/2021   ALBUMIN  2.5 (L) 01/13/2022   BILITOT 0.9 11/06/2021   ALKPHOS 62 11/06/2021   AST 28 11/06/2021   ALT 7 11/06/2021   ANIONGAP 10 01/13/2022     Radiology Studies: I have personally reviewed the imaging studies  No results found.     Estill Cotta M.D. [facility] 01/15/2022, 12:58 PM  Available via Epic secure chat 7am-7pm After 7 pm, please refer to night coverage provider listed on amion.

## 2022-01-15 NOTE — Progress Notes (Signed)
[facility] Progress Note   Subjective:    Seen and examined patient at bedside. Seen laying in bed resting. No acute complaints. Plan for HD today.  Objective Vitals:   01/14/22 1721 01/14/22 2200 01/15/22 0629 01/15/22 0940  BP: (!) 136/58 137/68 (!) 111/37 (!) 121/47  Pulse: 71 85 71 81  Resp: 18 18 18 19   Temp: 98.2 F (36.8 C) 98.7 F (37.1 C) 97.9 F (36.6 C) 97.8 F (36.6 C)  TempSrc:  Oral Oral Oral  SpO2: 99% 96% 100% (!) 84%  Weight:      Height:       Physical Exam General:WDWN female in NAD; on 2L [place] Heart:RRR, no mrg Lungs:CTAB, nml WOB on RA Abdomen:soft, NTND Extremities: bilateral AKAs; no edema BL stumps Dialysis Access: [facility]   Filed Weights   01/08/22 0031 01/08/22 0400  Weight: 116.1 kg 115 kg    Intake/Output Summary (Last 24 hours) at 01/15/2022 1358 Last data filed at 01/15/2022 0000 Gross per 24 hour  Intake 200 ml  Output 0 ml  Net 200 ml    Additional Objective Labs: Basic Metabolic Panel: Recent Labs  Lab 01/10/22 1206 01/13/22 1300  NA 135 138  K 5.3* 6.0*  CL 96* 101  CO2 26 27  GLUCOSE 96 91  BUN 42* 44*  CREATININE 9.71* 9.81*  CALCIUM 9.7 9.8  PHOS 6.8* 6.5*   Liver Function Tests: Recent Labs  Lab 01/10/22 1206 01/13/22 1300  ALBUMIN 2.6* 2.5*   No results for input(s): "LIPASE", "AMYLASE" in the last 168 hours. CBC: Recent Labs  Lab 01/10/22 1206 01/13/22 1300  WBC 10.5 10.0  HGB 7.8* 7.6*  HCT 25.5* 25.2*  MCV 99.6 99.6  PLT 279 317   Blood Culture    Component Value Date/Time   SDES BLOOD LEFT ANTECUBITAL 10/16/2021 1534   SPECREQUEST  10/16/2021 1534    BOTTLES DRAWN AEROBIC AND ANAEROBIC Blood Culture results may not be optimal due to an inadequate volume of blood received in culture bottles   CULT  10/16/2021 1534    NO GROWTH 5 DAYS Performed at [facility], [place] [address] [place], [place] [address]    REPTSTATUS 10/21/2021 FINAL 10/16/2021 1534    Cardiac  Enzymes: No results for input(s): "CKTOTAL", "CKMB", "CKMBINDEX", "TROPONINI" in the last 168 hours. CBG: No results for input(s): "GLUCAP" in the last 168 hours. Iron Studies: No results for input(s): "IRON", "TIBC", "TRANSFERRIN", "FERRITIN" in the last 72 hours. Lab Results  Component Value Date   INR 1.3 (H) 10/09/2021   INR 1.3 (H) 09/10/2021   INR 1.3 (H) 07/10/2021   Studies/Results: No results found.  Medications:   aspirin EC  81 mg Oral Daily   atorvastatin  10 mg Oral Daily   darbepoetin (ARANESP) injection - DIALYSIS  200 mcg Intravenous Q Wed-HD   docusate sodium  100 mg Oral BID   feeding supplement  1 Container Oral TID BM   gabapentin  100 mg Oral Q1200   gabapentin  200 mg Oral QHS   heparin  3,000 Units Dialysis Once in dialysis   heparin injection (subcutaneous)  5,000 Units Subcutaneous Q8H   levothyroxine  100 mcg Oral Q0600   loratadine  10 mg Oral Daily   lubiprostone  8 mcg Oral Q Tue   And   lubiprostone  8 mcg Oral Q Sat   midodrine  10 mg Oral Q M,W,F-HD   pantoprazole  40 mg Oral Daily  sevelamer carbonate  3.2 g Oral TID WC    Dialysis Orders: MWF - [facility]  4hrs, BFR 400, DFR 600,  AVF, EDW 146kg, 3K/ 2Ca - last hep B labs 8/21 - Heparin 2500 units IV with HD - Hectorol 24mcg IV q HD, held as of 10/30/21  Assessment/Plan: ESRD: on MWF. Next HD today per regular schedule. Check pre and post BUN to calculate URR for clearance to help in decision about possible reduction in treatment time. Last K+ 6 (pre-HD). Checking labs in AM. [facility] malfunction: Had required two [facility] exchanges and having issues again on 9/4, received overnight tPA dwell. IR thinks TDC is positional as no fibrin on tip at last exchange. Discussed the need for a long term solution. VVS consulted. Discussed with Dr. Donzetta Matters 10/9: I was informed of patient postponing AVF placement multiple times, despite VVS providing patient with dates so her daughter can be present. Per  VVS last note (10/2), she "would best be served with continued TDC use at this time". Will need to address AVF placement in the outpatient setting for now. Heparin boluses restarted with dialysis  Frequent loose stools: Improved. Lactulose d/c.  Stool softeners now prn. BP/volume: BP variable, was on low dose metoprolol and midodine -> metop discontinued on 9/1.  Continue Midodrine with HD.  Last hoyer weight was 118kg which is down significantly from OP EDW -> lower on discharge. BP variable.  Continue UF as tolerated. Chronic respiratory failure with hypoxia on cannula oxygen at home: on 5L O2 at baseline, have been able to titrate down O2 to 2-3L Anemia of ESRD: Last Hgb 7.6 - continue high dose ESA q Monday while here. Transfuse < 7. Checking labs pre-HD today. Secondary hyperparathyroidism: CorrCa improving, VDRA on hold. Phos variable but overall improved, Renvela increased on 9/30 to 3.2g TID. 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 Severe sacral decubitus wound: Per primary/wound care team. Disposition: Difficult situation. Prior to admit, patient required 6 staff members and 2 hoyer pads for transfer into HD chair as OP. Could not stay on dialysis for full treatment because of pain of multiple decubitus ulcers and PTAR unable to lift pt into ambulance for transport. SW has been unable to locate stretcher dialysis location so far. Her weight is much lower from admit, asked PT about trying to hoyer to recliner for HD -> apparently would need hoyer that wraps around her legs and patient is only able to tolerate <1 hour sitting up at this point. Discussed with PT today (10/10): although she is making progress, the main issue is her Rt ischial wound. PT is using a high-quality air cushion from the CIR unit. This remains to be very painful for her to sit. Please see renal navigator note from 12/23/21. Renal navigator has reached out to several out of state facilities to see if they  have onsite stretcher dialysis - waiting for return calls.   Tobie Poet, NP [facility] 01/15/2022,1:58 PM  LOS: 75 days

## 2022-01-15 NOTE — Progress Notes (Signed)
Received patient in bed to unit.  Alert and oriented.  Informed consent signed and in chart.   Treatment initiated: 2956 Treatment completed: 1905  Patient tolerated well.  Transported back to the room  Alert, without acute distress.  Hand-off given to patient's nurse.   Access used: Cath Access issues: none  Total UF removed: 2.5L Medication(s) given: Aranesp Post HD VS: 98.1,68,18,125/64,100% Post HD weight: 114.1kg   [facility]

## 2022-01-15 NOTE — Plan of Care (Signed)
Pt alert and oriented x 4. Med compliant. Pt received 2 doses of simethicone this shift. Denied pain and had no request for pain medication. This am dsg and pad was causing irritation. Dsg changed. Pt educated on AG dsg.  Problem: Skin Integrity: Goal: Risk for impaired skin integrity will decrease Outcome: Progressing   Problem: Education: Goal: Knowledge of General Education information will improve Description: Including pain rating scale, medication(s)/side effects and non-pharmacologic comfort measures Outcome: Progressing   Problem: Health Behavior/Discharge Planning: Goal: Ability to manage health-related needs will improve Outcome: Progressing   Problem: Clinical Measurements: Goal: Ability to maintain clinical measurements within normal limits will improve Outcome: Progressing Goal: Will remain free from infection Outcome: Progressing Goal: Diagnostic test results will improve Outcome: Progressing Goal: Respiratory complications will improve Outcome: Progressing Goal: Cardiovascular complication will be avoided Outcome: Progressing   Problem: Activity: Goal: Risk for activity intolerance will decrease Outcome: Progressing   Problem: Nutrition: Goal: Adequate nutrition will be maintained Outcome: Progressing   Problem: Coping: Goal: Level of anxiety will decrease Outcome: Progressing   Problem: Elimination: Goal: Will not experience complications related to bowel motility Outcome: Progressing Goal: Will not experience complications related to urinary retention Outcome: Progressing   Problem: Pain Managment: Goal: General experience of comfort will improve Outcome: Progressing   Problem: Safety: Goal: Ability to remain free from injury will improve Outcome: Progressing   Problem: Skin Integrity: Goal: Risk for impaired skin integrity will decrease Outcome: Progressing

## 2022-01-16 DIAGNOSIS — N186 End stage renal disease: Secondary | ICD-10-CM | POA: Diagnosis not present

## 2022-01-16 MED ORDER — ALTEPLASE 2 MG IJ SOLR: 2.0000 mg | Freq: Once | INTRAMUSCULAR | Status: DC | PRN

## 2022-01-16 MED ORDER — LIDOCAINE-PRILOCAINE 2.5-2.5 % EX CREA: 1.0000 | TOPICAL_CREAM | CUTANEOUS | Status: DC | PRN

## 2022-01-16 MED ORDER — HEPARIN SODIUM (PORCINE) 1000 UNIT/ML DIALYSIS
1000.0000 [IU] | INTRAMUSCULAR | Status: DC | PRN
  Filled 2022-01-16: qty 1

## 2022-01-16 MED ORDER — PENTAFLUOROPROP-TETRAFLUOROETH EX AERO: 1.0000 | INHALATION_SPRAY | CUTANEOUS | Status: DC | PRN

## 2022-01-16 MED ORDER — LIDOCAINE HCL (PF) 1 % IJ SOLN: 5.0000 mL | INTRAMUSCULAR | Status: DC | PRN

## 2022-01-16 NOTE — Progress Notes (Signed)
Navigator able to speak with admissions staff at [facility] and [facility]. Both facilities are in [place] and have on-site HD but pt must be able to tolerate HD in the chair. They do not do stretcher HD. Message left for admissions at [facility] requesting a return call. Multiple messages have already been left with no return calls. No other known options at this time.   Melven Sartorius Renal Navigator [phone]

## 2022-01-16 NOTE — Progress Notes (Signed)
PT Cancellation Note  Patient Details Name: Courtney Gray MRN: [id number] DOB: 01/16/1971   Cancelled Treatment:    Reason Eval/Treat Not Completed: Other (comment)  Patient and RN forgot to pre-medicate patient for anticipated therapy at 3:00 pm. Also her bandage is coming off her wound and needs to be re-dressed prior to activity.    [place]  Office [phone]   [name] 01/16/2022, 3:00 PM

## 2022-01-16 NOTE — TOC Progression Note (Signed)
Transition of Care [facility]) - Initial/Assessment Note    Patient Details  Name: Courtney Gray MRN: [id number] Date of Birth: 1970/07/01  Transition of Care [facility]) CM/SW Contact:    Milinda Antis, LCSWA Phone Number: 01/16/2022, 10:55 AM  Clinical Narrative:                 No new updates.  TOC will continue to follow.  Expected Discharge Plan: Long Term Nursing Home Barriers to Discharge: No SNF bed, Transportation, Waiting for outpatient dialysis   Patient Goals and CMS Choice Patient states their goals for this hospitalization and ongoing recovery are:: To be at a facility close to her daughter      Expected Discharge Plan and Services Expected Discharge Plan: Mutual       Living arrangements for the past 2 months: Single Family Home                                      Prior Living Arrangements/Services Living arrangements for the past 2 months: Single Family Home Lives with:: Adult Children Patient language and need for interpreter reviewed:: Yes        Need for Family Participation in Patient Care: Yes (Comment) Care giver support system in place?: No (comment)   Criminal Activity/Legal Involvement Pertinent to Current Situation/Hospitalization: No - Comment as needed  Activities of Daily Living Home Assistive Devices/Equipment: [employer] fast streamer ADL Screening (condition at time of admission) Patient's cognitive ability adequate to safely complete daily activities?: No Is the patient deaf or have difficulty hearing?: No Does the patient have difficulty seeing, even when wearing glasses/contacts?: No Does the patient have difficulty concentrating, remembering, or making decisions?: No Patient able to express need for assistance with ADLs?: Yes Does the patient have difficulty dressing or bathing?: Yes Independently performs ADLs?: No Does the patient have difficulty walking or climbing stairs?: Yes Weakness of Legs: Both Weakness of  Arms/Hands: Both  Permission Sought/Granted Permission sought to share information with : Other (comment) Permission granted to share information with : Yes, Verbal Permission Granted  Share Information with NAME: Burnley,antoinette (Daughter)   [phone]  Permission granted to share info w AGENCY: SNF        Emotional Assessment Appearance:: Appears older than stated age Attitude/Demeanor/Rapport: Engaged Affect (typically observed): Tearful/Crying, Accepting Orientation: : Oriented to Situation, Oriented to  Time, Oriented to Place, Oriented to Self Alcohol / Substance Use: Not Applicable Psych Involvement: No (comment)  Admission diagnosis:  End stage renal disease ([place]) [N18.6] Fluid overload [E87.70] Obesity with serious comorbidity, unspecified classification, unspecified obesity type [E66.9] Pressure injury of right buttock, stage 2 (HCC) [L89.312] Patient Active Problem List   Diagnosis Date Noted   Acute on chronic diastolic CHF (congestive heart failure) ([place]) 12/12/2021   PAD (peripheral artery disease) ([place]) 12/12/2021   Irritable bowel syndrome (IBS) 12/12/2021   Amputation of left middle finger 12/12/2021   GERD (gastroesophageal reflux disease) 12/12/2021   Hypertension    Type 2 diabetes mellitus with hyperlipidemia ([place])    Decubitus ulcer of sacral region, stage 2 ([place])    Class 3 obesity ([place])    Bacteremia    PVD (peripheral vascular disease) ([place])    Hepatic abscess    Pressure injury of skin 09/13/2021   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[place])    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   Lactic acidosis 09/10/2021   Mixed diabetic  hyperlipidemia associated with type 2 diabetes mellitus ([place]) 09/10/2021   Coronary artery disease involving native coronary artery of native heart without angina pectoris 09/10/2021   Elevated troponin level not due myocardial infarction 09/10/2021   Hypotension 09/10/2021   Dry gangrene (HCC)    Pain 08/03/2021    Ischemic foot pain at rest 08/03/2021   Hypoglycemia 07/10/2021   Volume overload 07/09/2021   Leukocytosis 06/11/2021   Acute gout 06/11/2021   Hyperkalemia 05/21/2021   Hemorrhoids    Hyperlipidemia 05/18/2021   Rectal bleeding 05/17/2021   Morbid obesity with BMI of 60.0-69.9, adult ([place]) 05/17/2021   NSTEMI (non-ST elevated myocardial infarction) ([place]) 05/16/2021   Essential hypertension 09/01/2020   End stage renal disease ([place]) 09/01/2020   Chronic respiratory failure with hypoxia ([place]) 09/01/2020   Hypertensive emergency    Hypertensive urgency 11/28/2019   Uncontrolled type 2 diabetes mellitus with hypoglycemia, with long-term current use of insulin ([place]) 11/28/2019   Anemia due to chronic kidney disease 11/28/2019   PCP:  Monico Blitz, MD Pharmacy:   [facility], [place] [address] [place] [address] Phone: [phone] Fax: [phone]  [facility] [address] [place] [address] Phone: [phone] Fax: [phone]  [place], [place] - [address] [place] [address] Phone: [phone] Fax: [phone]     Social Determinants of Health ([place]) Interventions    Readmission Risk Interventions    09/16/2021    4:36 PM 08/28/2021   11:50 AM 07/12/2021   12:38 PM  Readmission Risk Prevention Plan  Transportation Screening Complete Complete Complete  Medication Review (RN Care Manager)  Complete Complete  PCP or Specialist appointment within 3-5 days of discharge  Complete Complete  HRI or Home Care Consult Complete Complete Complete  SW Recovery Care/Counseling Consult Complete Complete Complete  Palliative Care Screening  Not Applicable Not Applicable  Skilled Nursing Facility Complete Not Applicable Complete

## 2022-01-16 NOTE — Progress Notes (Signed)
[facility] Progress Note   Subjective:    Seen and examined patient at bedside. Tolerated yesterday's HD with net UF 2.5L. Next HD 10/13.  Objective Vitals:   01/15/22 1905 01/15/22 2032 01/16/22 0512 01/16/22 0903  BP: 125/64 (!) 117/42 (!) 128/50 (!) 100/58  Pulse: 80 73 70 71  Resp: 18 19 20 16   Temp: 98.1 F (36.7 C) 98.7 F (37.1 C) 98.9 F (37.2 C) 98.1 F (36.7 C)  TempSrc: Oral Oral Oral Oral  SpO2: 100% 100% 99% 100%  Weight: 114.1 kg     Height:       Physical Exam General:WDWN female in NAD; on 2L [place] Heart:RRR, no mrg Lungs:CTAB, nml WOB on RA Abdomen:soft, NTND Extremities: bilateral AKAs; no edema BL stumps Dialysis Access: [facility]   [facility] Weights   01/15/22 1430 01/15/22 1905  Weight: 116.7 kg 114.1 kg    Intake/Output Summary (Last 24 hours) at 01/16/2022 1430 Last data filed at 01/16/2022 0827 Gross per 24 hour  Intake 600 ml  Output 2500 ml  Net -1900 ml    Additional Objective Labs: Basic Metabolic Panel: Recent Labs  Lab 01/10/22 1206 01/13/22 1300 01/15/22 1517  NA 135 138 137  K 5.3* 6.0* 5.4*  CL 96* 101 101  CO2 26 27 26   GLUCOSE 96 91 130*  BUN 42* 44* 35*  CREATININE 9.71* 9.81* 7.67*  CALCIUM 9.7 9.8 9.2  PHOS 6.8* 6.5* 7.4*   Liver Function Tests: Recent Labs  Lab 01/10/22 1206 01/13/22 1300 01/15/22 1517  ALBUMIN 2.6* 2.5* 2.6*   No results for input(s): "LIPASE", "AMYLASE" in the last 168 hours. CBC: Recent Labs  Lab 01/10/22 1206 01/13/22 1300 01/15/22 1517  WBC 10.5 10.0 8.5  HGB 7.8* 7.6* 7.6*  HCT 25.5* 25.2* 25.2*  MCV 99.6 99.6 100.4*  PLT 279 317 267   Blood Culture    Component Value Date/Time   SDES BLOOD LEFT ANTECUBITAL 10/16/2021 1534   SPECREQUEST  10/16/2021 1534    BOTTLES DRAWN AEROBIC AND ANAEROBIC Blood Culture results may not be optimal due to an inadequate volume of blood received in culture bottles   CULT  10/16/2021 1534    NO GROWTH 5 DAYS Performed at [facility] [address], [place], [place] [address]    REPTSTATUS 10/21/2021 FINAL 10/16/2021 1534    Cardiac Enzymes: No results for input(s): "CKTOTAL", "CKMB", "CKMBINDEX", "TROPONINI" in the last 168 hours. CBG: No results for input(s): "GLUCAP" in the last 168 hours. Iron Studies: No results for input(s): "IRON", "TIBC", "TRANSFERRIN", "FERRITIN" in the last 72 hours. Lab Results  Component Value Date   INR 1.3 (H) 10/09/2021   INR 1.3 (H) 09/10/2021   INR 1.3 (H) 07/10/2021   Studies/Results: No results found.  Medications:   aspirin EC  81 mg Oral Daily   atorvastatin  10 mg Oral Daily   darbepoetin (ARANESP) injection - DIALYSIS  200 mcg Intravenous Q Wed-HD   docusate sodium  100 mg Oral BID   feeding supplement  1 Container Oral TID BM   gabapentin  100 mg Oral Q1200   gabapentin  200 mg Oral QHS   heparin injection (subcutaneous)  5,000 Units Subcutaneous Q8H   heparin sodium (porcine)  2,500 Units Intravenous Once   levothyroxine  100 mcg Oral Q0600   loratadine  10 mg Oral Daily   lubiprostone  8 mcg Oral Q Tue   And   lubiprostone  8 mcg Oral Q Sat  midodrine  10 mg Oral Q M,W,F-HD   pantoprazole  40 mg Oral Daily   sevelamer carbonate  3.2 g Oral TID WC    Dialysis Orders: MWF - [facility]  4hrs, BFR 400, DFR 600,  AVF, EDW 146kg, 3K/ 2Ca - last hep B labs 8/21 - Heparin 2500 units IV with HD - Hectorol 55mcg IV q HD, held as of 10/30/21  Assessment/Plan: ESRD: on MWF. Next HD 01/17/22. Check pre and post BUN to calculate URR for clearance to help in decision about possible reduction in treatment time. Last K+ now 5.2. Checking labs in AM. The [place] malfunction: Had required two [facility] exchanges and having issues again on 9/4, received overnight tPA dwell. IR thinks TDC is positional as no fibrin on tip at last exchange. Discussed the need for a long term solution. VVS consulted. Discussed with Dr. Donzetta Matters 10/9: I was informed of patient postponing AVF  placement multiple times, despite VVS providing patient with dates so her daughter can be present. Per VVS last note (10/2), she "would best be served with continued TDC use at this time". Will need to address AVF placement in the outpatient setting for now. I discussed this with patient today and now she is upset; however, the plan will not change. Heparin boluses restarted with dialysis  Frequent loose stools: Improved. Lactulose d/c.  Stool softeners now prn. BP/volume: BP variable, was on low dose metoprolol and midodine -> metop discontinued on 9/1.  Continue Midodrine with HD.  Last hoyer weight was 118kg which is down significantly from OP EDW -> lower on discharge. BP variable.  Continue UF as tolerated. Chronic respiratory failure with hypoxia on cannula oxygen at home: on 5L O2 at baseline, have been able to titrate down O2 to 2-3L Anemia of ESRD: Hgbs ranging in the 7s - continue high dose ESA q Monday while here. Dose given yesterday. Monitor trend for this week. May need to raise to max dose (314mcg). Transfuse < 7. Checking labs pre-HD today. Secondary hyperparathyroidism: CorrCa improving, VDRA on hold. Phos is trending up-now 7.4. She admits to me on missing some doses d/t medication not coming with her meal. Will reinfoprce with Nursing to ensure medication is given with meals. Continue current Renvela regimen at 3.2g TID for now. 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 Severe sacral decubitus wound: Per primary/wound care team. Disposition: Difficult situation. Prior to admit, patient required 6 staff members and 2 hoyer pads for transfer into HD chair as OP. Could not stay on dialysis for full treatment because of pain of multiple decubitus ulcers and PTAR unable to lift pt into ambulance for transport. SW has been unable to locate stretcher dialysis location so far. Her weight is much lower from admit, asked PT about trying to hoyer to recliner for HD ->  apparently would need hoyer that wraps around her legs and patient is only able to tolerate <1 hour sitting up at this point. Discussed with PT today (10/10): although she is making progress, the main issue is her Rt ischial wound. PT is using a high-quality air cushion from the CIR unit. This remains to be very painful for her to sit. Please see renal navigator note from 12/23/21. Renal navigator has reached out to several out of state facilities to see if they have onsite stretcher dialysis - waiting for return calls.     Tobie Poet, NP [facility] 01/16/2022,2:30 PM  LOS: 76 days

## 2022-01-16 NOTE — Progress Notes (Signed)
Progress Note   Patient: Courtney Gray JQG:[id number] DOB: [birth year] DOA: 11/01/2021     76 DOS: the patient was seen and examined on 01/16/2022   Brief hospital course: 51 year old F ESRD on HD TTS,  recent lengthy hospitalization for Bacteroides [place] bacteremia, hepatic and splenic abscess --required amputation of third digit of left hand and underwent left AKA and had splenic drain as well as CT-guided drainage of liver abscess she had a line holiday on last admission on long-term IV antibiotics until 04/15/7586 diastolic CHF, PVD s/p bilateral AKA, IDDM-2, morbid obesity and  chronic sacral wound presenting with progressive shortness of breath in the setting of missed dialysis and admitted for hypertensive emergency  Per patient, she never missed her dialysis however according to outpatient HD records, it takes at least 4 staff members and [facility] lift for patient to be seated in the dialysis chair.    Symptoms improved with hemodialysis.  Patient underwent R UE venogram on 8/30.  Vascular surgery following for possible RUE HD access.    Patient is now waiting placement where she can get HD on stretcher that has been difficult to find.  Assessment and Plan: ESRD on HD -4 people and [place] lift outpatient HD -Per vascular, patient postponed R aVF placement/AVG several times ---Plan for OP perm access placement -Not able to find stretcher dialysis location.  Final plan on her HD unclear. TOC continues to follow with no stretcher dialysis bed as yet -Cont with PT as tolerated   Chronic hypoxic respiratory failure: 3 L at baseline.  Now saturating at 100% on 2 L.  Reports saturation of 90. -Weaning off oxygen as tolerated -Currently remains on 2LNC    Acute on chronic diastolic CHF/volume overload/hypertensive emergency -Nephrology managing volume with HD -Imdur and metoprolol on hold due to hypotension -Continue midodrine 10 mg Monday Wednesday Friday -Phos being managed  by renal--cont Renvela 3.2 tid meals   Hypotension: Resolved.  -Continue midodrine as above   Anemia of CKD: H&H relatively stable. -Transfusion threshold criteria not met -IV iron per nephrology.  Last iron level 28 and saturation ratio 16 -repeatin am   Controlled IDDM-2: A1c 5.3%.  Discontinued CBG monitoring and SSI Previously had been on glipizide which has been discontinued   Recent septic shock secondary to Bacteroides, bacteremia and liver, and splenic abscess-completed antibiotic course.   Leukocytosis: Seems chronic but improved. -Monitor intermittently   PAD s/p bilateral AKA -Continue aspirin and statin. -Requested RN to place redistribution pad under area of decub -nursing to check and see if PRessure mattress can be fixed   Hyperlipidemia -Statin.    Hypomagnesemia/hypokalemia/hyponatremia/hypophosphatemia -Per nephrology   Irritable bowel syndrome with constipation. Intractable nausea and vomiting -Continue Amitiza Tue/SAt -Colace, Zofran as needed--change most to PRN given increasing frequency of stools   Sacral decub Wound examined on 9/29 looks well-healed  Deep wound---will need continued WOC follow up-will re-eval later this week Pain from this limits mobility and sitting  Morbid obesity Body mass index is 44.68 kg/m. Recommend diet/lifestyle modification       Subjective:   Overall well No distress  Can only sit-up about 1 hour Pain moderate wound not reviewed Daughter at bedside today  Physical Exam: Vitals:   01/15/22 1905 01/15/22 2032 01/16/22 0512 01/16/22 0903  BP: 125/64 (!) 117/42 (!) 128/50 (!) 100/58  Pulse: 80 73 70 71  Resp: 18 19 20 16   Temp: 98.1 F (36.7 C) 98.7 F (37.1 C) 98.9 F (37.2 C) 98.1 F (36.7 C)  TempSrc: Oral Oral Oral Oral  SpO2: 100% 100% 99% 100%  Weight: 114.1 kg     Height:       black female thick neck Mallampati 4 S1-S2 no murmur Chest is clear Decubitus ulcer not reviewed today  Data  Reviewed:  No recent labs  Family Communication: Pt in room, family not at bedside  Disposition: Status is: Inpatient Remains inpatient appropriate because: Severity of illness  Planned Discharge Destination:  Unclear at this time     Author: Nita Sells, MD 01/16/2022 4:03 PM  For on call review [email or web address].

## 2022-01-16 NOTE — Care Management Important Message (Signed)
Important Message  Patient Details  Name: Courtney Gray MRN: [id number] Date of Birth: January 31, 1971   Medicare Important Message Given:  Yes     [name] 01/16/2022, 2:03 PM

## 2022-01-16 NOTE — Progress Notes (Signed)
Physical Therapy Treatment Patient Details Name: Courtney Gray MRN: [id number] DOB: 1970-09-18 Today's Date: 01/16/2022   History of Present Illness Pt adm 7/28 with acute pulmonary edema. 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    PT Comments    Patient fully participated in core exercises for strengthening and bed mobility.    Recommendations for follow up therapy are one component of a multi-disciplinary discharge planning process, led by the attending physician.  Recommendations may be updated based on patient status, additional functional criteria and insurance authorization.  Follow Up Recommendations  Skilled nursing-short term rehab (<3 hours/day) Can patient physically be transported by private vehicle: No   Assistance Recommended at Discharge Frequent or constant Supervision/Assistance  Patient can return home with the following Two people to help with walking and/or transfers;Two people to help with bathing/dressing/bathroom;Assist for transportation;Assistance with cooking/housework;Help with stairs or ramp for entrance   Equipment Recommendations  Wheelchair (measurements PT);Wheelchair cushion (measurements PT);Other (comment);Hospital bed (Needs wide w/c with cushion and anti-tippers with ?offset wheels to reduce risk of tipping backwards)    Recommendations for Other Services       Precautions / Restrictions Precautions Precautions: Fall Precaution Comments: bil AKA, 2L O2 at baseline, LUQ drain Restrictions Weight Bearing Restrictions: No RUE Weight Bearing: Weight bearing as tolerated LUE Weight Bearing: Weight bearing as tolerated RLE Weight Bearing: Non weight bearing LLE Weight Bearing: Non weight bearing Other Position/Activity Restrictions: minimized pushing/pulling and weight through L hand, dressing now removed     Mobility  Bed Mobility Overal bed mobility:  Needs Assistance Bed Mobility: Rolling Rolling: Supervision   Supine to sit: Supervision     General bed mobility comments: using rails; HOB elevated to pull to long-sitting    Transfers                        Ambulation/Gait               General Gait Details: unable   Stairs             Wheelchair Mobility    Modified Rankin (Stroke Patients Only)       Balance Overall balance assessment: Needs assistance Sitting-balance support: No upper extremity supported Sitting balance-[name] Scale: Good                                      Cognition Arousal/Alertness: Awake/alert Behavior During Therapy: WFL for tasks assessed/performed Overall Cognitive Status: Within Functional Limits for tasks assessed                                          Exercises Other Exercises Other Exercises: reverse situps (HOB flat, holding onto headboard, pulling both legs up and slowly lowering back down x 20 reps Other Exercises: supine "windshield wipers" with hips flexed at 90 (both legs moving together from right to center to left to center) x 20 reps Other Exercises: sit ups from bed adjusting bed heights with emphasis on activating core, breathing and slow descent x 25 reps    General Comments General comments (skin integrity, edema, etc.): Daughter present      Pertinent Vitals/Pain Pain Assessment Pain Assessment: Faces Faces Pain Scale:  Hurts even more Pain Location: rt ischial wound Pain Descriptors / Indicators: Guarding, Sore, Moaning Pain Intervention(s): Limited activity within patient's tolerance, Monitored during session, Premedicated before session    Home Living Family/patient expects to be discharged to:: Unsure Living Arrangements: Children Available Help at Discharge: Family;Available PRN/intermittently Type of Home: Apartment Home Access: Ramped entrance Entrance Stairs-Rails: None Entrance Stairs-Number  of Steps: 2   Home Layout: One level Home Equipment: Hand held shower head;Shower seat;Cane - single point;Grab bars - tub/shower;Rollator (4 wheels);Hospital bed Additional Comments: lives with daughter    Prior Function            PT Goals (current goals can now be found in the care plan section) Acute Rehab PT Goals Patient Stated Goal: Pt wants to get up into a w/c PT Goal Formulation: With patient Time For Goal Achievement: 01/23/22 Potential to Achieve Goals: Fair Progress towards PT goals: Progressing toward goals    Frequency    Min 2X/week      PT Plan Current plan remains appropriate    Co-evaluation              AM-PAC PT "6 Clicks" Mobility   Outcome Measure  Help needed turning from your back to your side while in a flat bed without using bedrails?: A Little Help needed moving from lying on your back to sitting on the side of a flat bed without using bedrails?: A Lot Help needed moving to and from a bed to a chair (including a wheelchair)?: Total Help needed standing up from a chair using your arms (e.g., wheelchair or bedside chair)?: Total Help needed to walk in hospital room?: Total Help needed climbing 3-5 steps with a railing? : Total 6 Click Score: 9    End of Session Equipment Utilized During Treatment: Oxygen Activity Tolerance: Patient tolerated treatment well Patient left: with call bell/phone within reach;in bed;with family/visitor present   PT Visit Diagnosis: Other abnormalities of gait and mobility (R26.89);Muscle weakness (generalized) (M62.81) Pain - Right/Left: Left Pain - part of body: Leg (ischial wound)     Time: 5400-8676 PT Time Calculation (min) (ACUTE ONLY): 23 min  Charges:  $Therapeutic Exercise: 23-37 mins                      Arby Barrette, PT Acute Rehabilitation Services  Office [phone]    [name] 01/16/2022, 4:32 PM

## 2022-01-17 NOTE — Progress Notes (Addendum)
Wounds examined as below--look clean-slow healing           She remians well I have encouraged her to try to mobilize as her bed doesn't pulse and move her--the challenge I set out for her was to be seated when I next see her  No charge  Verneita Griffes, MD [facility] 4:40 PM

## 2022-01-17 NOTE — Progress Notes (Signed)
Subjective: In room HD pending today no complaints  Objective Vital signs in last 24 hours: Vitals:   01/16/22 0512 01/16/22 0903 01/16/22 1809 01/16/22 2310  BP: (!) 128/50 (!) 100/58 (!) 121/48 (!) 121/48  Pulse: 70 71 81 77  Resp: 20 16 17    Temp: 98.9 F (37.2 C) 98.1 F (36.7 C)  98.2 F (36.8 C)  TempSrc: Oral Oral  Oral  SpO2: 99% 100% 100% 100%  Weight:      Height:       Weight change:   Physical Exam: General: Obese chronically ill female NAD on 2 L nasal cannula Heart: RRR no MRG Lungs: CTA anteriorly, 2 L nasal cannula O2 100% O2 sat Abdomen: Morbid obese, NABS, soft, NTND Extremities: Bilateral AKA's trace edema Dialysis Access: L IJ TDC   OP dialysis Orders: MWF - [facility]  4hrs, BFR 400, DFR 600,  AVF, EDW 146kg, 3K/ 2Ca - last hep B labs 8/21 - Heparin 2500 units IV with HD - Hectorol 7mcg IV q HD, held as of 10/30/21  Problem/Plan: ESRD= HD schedule MWF, HD for today, labs pending, last creatinine 7.67, K5.4 in setting of large body habitus 4-hour HD continue [facility] malfunction = heparin boluses restarted with HD.  Has required 2 TDC exchanges, tPA 12 overnight, IR thought TDC is positional no fibrin on tape on last exchange .needs long-term access.  VVS consulted, Dr. Donzetta Matters 10/9: informed of patient postponing AVF placement multiple times, despite VVS providing patient with dates so her daughter can be present. Per VVS last note (10/2), she "would best be served with continued TDC use at this time". Will need to address AVF placement in the outpatient setting for now.anemia -  BP/volume= BP is variable on low-dose metoprolol and midodrine metoprolol DC'd 9/01/ Last  hoyer  wt=118 kg lower at discharge COPD history of hypoxia on cannula O2 at home(was on 5 L at baseline has been able to taper off to 2-3 L [place]) Anemia of ESRD= hGB 7.6 seems to be baseline past week on high-dose ESA q. 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 Severe sacral decubitus wound: Per primary/wound care team. Disposition: Difficult situation. Prior to admit, patient required 6 staff members and 2 hoyer pads for transfer into HD chair as OP. Could not stay on HD for full tx. 2/2 pain of multiple decubitus ulcers and PTAR unable to lift pt into ambulance for transport. SW has been unable to locate stretcher dialysis location so far. Her wt .is much lower from admit, asked PT about trying to hoyer to recliner for HD -> apparently would need hoyer that wraps around her legs and patient is only able to tolerate <1 hour sitting up at this point./ the main issue is her Rt ischial wound. PT is using a high-quality air cushion from the CIR unit. This remains to be very painful for her to sit. Please see renal navigator note from 12/23/21. Renal navigator has reached out to several out of state facilities to see if they have onsite stretcher dialysis - waiting for return calls.  Ernest Haber, PA-C [facility] [phone] 01/17/2022,10:43 AM  LOS: 77 days   Labs: Basic Metabolic Panel: Recent Labs  Lab 01/10/22 1206 01/13/22  1300 01/15/22 1517  NA 135 138 137  K 5.3* 6.0* 5.4*  CL 96* 101 101  CO2 26 27 26   GLUCOSE 96 91 130*  BUN 42* 44* 35*  CREATININE 9.71* 9.81* 7.67*  CALCIUM 9.7 9.8 9.2  PHOS 6.8* 6.5* 7.4*   Liver Function Tests: Recent Labs  Lab 01/10/22 1206 01/13/22 1300 01/15/22 1517  ALBUMIN 2.6* 2.5* 2.6*   No results for input(s): "LIPASE", "AMYLASE" in the last 168 hours. No results for input(s): "AMMONIA" in the last 168 hours. CBC: Recent Labs  Lab 01/10/22 1206 01/13/22 1300 01/15/22 1517  WBC 10.5 10.0 8.5  HGB 7.8* 7.6* 7.6*  HCT 25.5*  25.2* 25.2*  MCV 99.6 99.6 100.4*  PLT 279 317 267   Cardiac Enzymes: No results for input(s): "CKTOTAL", "CKMB", "CKMBINDEX", "TROPONINI" in the last 168 hours. CBG: No results for input(s): "GLUCAP" in the last 168 hours.  Studies/Results: No results found. Medications:   aspirin EC  81 mg Oral Daily   atorvastatin  10 mg Oral Daily   darbepoetin (ARANESP) injection - DIALYSIS  200 mcg Intravenous Q Wed-HD   docusate sodium  100 mg Oral BID   feeding supplement  1 Container Oral TID BM   gabapentin  100 mg Oral Q1200   gabapentin  200 mg Oral QHS   heparin injection (subcutaneous)  5,000 Units Subcutaneous Q8H   heparin sodium (porcine)  2,500 Units Intravenous Once   levothyroxine  100 mcg Oral Q0600   loratadine  10 mg Oral Daily   lubiprostone  8 mcg Oral Q Tue   And   lubiprostone  8 mcg Oral Q Sat   midodrine  10 mg Oral Q M,W,F-HD   pantoprazole  40 mg Oral Daily   sevelamer carbonate  3.2 g Oral TID WC

## 2022-01-18 DIAGNOSIS — N186 End stage renal disease: Secondary | ICD-10-CM | POA: Diagnosis not present

## 2022-01-18 LAB — RETICULOCYTES
Immature Retic Fract: 20.4 % — ABNORMAL HIGH (ref 2.3–15.9)
RBC.: 2.96 MIL/uL — ABNORMAL LOW (ref 3.87–5.11)
Retic Count, Absolute: 79 10*3/uL (ref 19.0–186.0)
Retic Ct Pct: 2.7 % (ref 0.4–3.1)

## 2022-01-18 LAB — FERRITIN: Ferritin: 935 ng/mL — ABNORMAL HIGH (ref 11–307)

## 2022-01-18 LAB — IRON AND TIBC
Iron: 30 ug/dL (ref 28–170)
Saturation Ratios: 19 % (ref 10.4–31.8)
TIBC: 158 ug/dL — ABNORMAL LOW (ref 250–450)
UIBC: 128 ug/dL

## 2022-01-18 MED ORDER — OXYCODONE HCL 5 MG PO TABS
5.0000 mg | ORAL_TABLET | ORAL | Status: DC | PRN
  Administered 2022-01-18 – 2022-01-27 (×7): 10 mg via ORAL
  Filled 2022-01-18 (×7): qty 2

## 2022-01-18 NOTE — Progress Notes (Signed)
Subjective: Noted off dialysis 330 this a.m. 1400 UF total, no complaints this a.m. Noted Dr Verlon Au picture of ischial/sacral wounds are clean and slow healing yesterday documentation picture  Objective Vital signs in last 24 hours: Vitals:   01/18/22 0306 01/18/22 0317 01/18/22 0332 01/18/22 0339  BP: (!) 124/47 (!) 126/51 126/66 (!) 131/44  Pulse: 67 64 67 65  Resp: 15 13 (!) 0 12  Temp:    97.7 F (36.5 C)  TempSrc:    Oral  SpO2: 100% 100% 100% 100%  Weight:      Height:       Weight change:   Physical Exam: General: Obese chronically ill female NAD on 2 L nasal cannula Heart: RRR no MRG Lungs: CTA anteriorly, 2 L nasal cannula O2 100% O2 sat Abdomen: Morbid obese, NABS, soft, NTND Extremities: Bilateral AKA's trace edema Dialysis Access: L IJ TDC    OP dialysis Orders: MWF - [facility]  4hrs, BFR 400, DFR 600,  AVF, EDW 146kg, 3K/ 2Ca - last hep B labs 8/21 - Heparin 2500 units IV with HD - Hectorol 41mcg IV q HD, held as of 10/30/21   Problem/Plan: ESRD= HD schedule MWF, HD yesterday late start 10/13  [facility] malfunction = heparin boluses restarted with HD.  Has required 2 TDC exchanges, tPA 12 overnight, IR thought TDC is positional no fibrin on tape on last exchange .needs long-term access.  VVS consulted, Dr. Donzetta Matters 10/9: informed of patient postponing AVF placement multiple times, despite VVS providing patient with dates so her daughter can be present. Per VVS last note (10/2), she "would best be served with continued TDC use at this time". Will need to address AVF placement in the outpatient setting for now.anemia -  BP/volume= BP is variable on low-dose metoprolol and midodrine metoprolol DC'd 9/01/ Last  hoyer  wt=118 kg lower at discharge Severe ischial sacral decubitus wound: Per primary/wound care team.,  Limiting her elected to sit in chair noted slow healing per Dr Verlon Au picture  COPD history of hypoxia on cannula O2 at home(was on 5 L at baseline has  been able to taper off to 2-3 L [place]) Anemia of ESRD= hGB 7.6 seems to be baseline past week on high-dose ESA q. 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 Disposition: Difficult situation. Prior to admit, patient required 6 staff members and 2 hoyer pads for transfer into HD chair as OP. Could not stay on HD for full tx. 2/2 pain of multiple decubitus ulcers and PTAR unable to lift pt into ambulance for transport. SW has been unable to locate stretcher dialysis location so far. Her wt .is much lower from admit, asked PT about trying to hoyer to recliner for HD -> apparently would need hoyer that wraps around her legs and patient is only able to tolerate <1 hour sitting up at this point./ the main issue is her Rt ischial wound. PT is using a high-quality air cushion from the CIR unit. This remains to be very painful for her to sit. Please see renal navigator note from 12/23/21. Renal navigator has reached out to several out of state facilities to see if they have onsite stretcher dialysis - waiting for return calls.  Ernest Haber, PA-C [facility] [phone] 01/18/2022,2:03 PM  LOS: 78 days   Labs: Basic Metabolic Panel: Recent Labs  Lab 01/13/22 1300 01/15/22 1517  NA 138 137  K 6.0* 5.4*  CL 101 101  CO2 27 26  GLUCOSE 91 130*  BUN 44* 35*  CREATININE 9.81* 7.67*  CALCIUM 9.8 9.2  PHOS 6.5* 7.4*   Liver Function Tests: Recent Labs  Lab 01/13/22 1300 01/15/22 1517  ALBUMIN 2.5* 2.6*   No results for input(s): "LIPASE", "AMYLASE" in the last 168 hours. No results for input(s): "AMMONIA" in the last 168 hours. CBC: Recent Labs  Lab 01/13/22 1300 01/15/22 1517  WBC 10.0 8.5  HGB 7.6*  7.6*  HCT 25.2* 25.2*  MCV 99.6 100.4*  PLT 317 267   Cardiac Enzymes: No results for input(s): "CKTOTAL", "CKMB", "CKMBINDEX", "TROPONINI" in the last 168 hours. CBG: No results for input(s): "GLUCAP" in the last 168 hours.  Studies/Results: No results found. Medications:   aspirin EC  81 mg Oral Daily   atorvastatin  10 mg Oral Daily   darbepoetin (ARANESP) injection - DIALYSIS  200 mcg Intravenous Q Wed-HD   docusate sodium  100 mg Oral BID   feeding supplement  1 Container Oral TID BM   gabapentin  100 mg Oral Q1200   gabapentin  200 mg Oral QHS   heparin injection (subcutaneous)  5,000 Units Subcutaneous Q8H   levothyroxine  100 mcg Oral Q0600   loratadine  10 mg Oral Daily   lubiprostone  8 mcg Oral Q Tue   And   lubiprostone  8 mcg Oral Q Sat   midodrine  10 mg Oral Q M,W,F-HD   pantoprazole  40 mg Oral Daily   sevelamer carbonate  3.2 g Oral TID WC

## 2022-01-18 NOTE — Progress Notes (Signed)
Progress Note   Patient: Courtney Gray WER:[id number] DOB: [birth year] DOA: 11/01/2021     78 DOS: the patient was seen and examined on 01/18/2022   Brief hospital course: 51 year old F ESRD on HD TTS,  recent lengthy hospitalization for Bacteroides [place] bacteremia, hepatic and splenic abscess --required amputation of third digit of left hand and underwent left AKA and had splenic drain as well as CT-guided drainage of liver abscess she had a line holiday on last admission on long-term IV antibiotics until 04/16/5091 diastolic CHF, PVD s/p bilateral AKA, IDDM-2, morbid obesity and  chronic sacral wound presenting with progressive shortness of breath in the setting of missed dialysis and admitted for hypertensive emergency  Per patient, she never missed her dialysis however according to outpatient HD records, it takes at least 4 staff members and [facility] lift for patient to be seated in the dialysis chair.    Symptoms improved with hemodialysis.  Patient underwent R UE venogram on 8/30.  Vascular surgery following for possible RUE HD access.    Patient is now waiting placement where she can get HD on stretcher that has been difficult to find.  Assessment and Plan:  Sacral decub Wound examined on 10/14 and looked like were healing well, 7 cm deep [see pictures] 10/15 she has tenderness and masceration of the area form what appears to be some bleeding on HD unit last night When I examined today, didn't see any active bleed Informed and appreciate RN dressing and packing it agin [no packing was noted today within depth of wound--this is needed ot prevent bleed and help heal from depth outward]  Pain from this limits mobility and sitting  ESRD on HD -4 people and [place] lift outpatient HD -Per vascular, patient postponed R aVF placement/AVG several times ---Plan for OP perm access placement -Not able to find stretcher dialysis location.  Final plan on her HD unclear. TOC continues  to follow with no stretcher dialysis bed as yet -Cont with PT as tolerated -she sat straight up with me today--congratulated   Chronic hypoxic respiratory failure: 3 L at baseline.  Now saturating at 100% on 2 L.  Reports saturation of 90. -Weaning off oxygen as tolerated -Currently remains on 2LNC    Acute on chronic diastolic CHF/volume overload/hypertensive emergency -Nephrology managing volume with HD -Imdur and metoprolol on hold due to hypotension -Continue midodrine 10 mg Monday Wednesday Friday -Phos being managed by renal--cont Renvela 3.2 tid meals   Hypotension: Resolved.  -Continue midodrine as above   Anemia of CKD: H&H relatively stable. -Transfusion threshold criteria not met -IV iron per nephrology.  Last iron level 28 and saturation ratio 16 on 9/14 -repeat with am labs   Controlled IDDM-2: A1c 5.3%.  Discontinued CBG monitoring and SSI Previously had been on glipizide which has been discontinued   Recent septic shock secondary to Bacteroides, bacteremia and liver, and splenic abscess-completed antibiotic course.   Leukocytosis: Seems chronic but improved. -Monitor intermittently   PAD s/p bilateral AKA -Continue aspirin and statin. -Requested RN to place redistribution pad under area of decub   Hyperlipidemia -Statin.    Hypomagnesemia/hypokalemia/hyponatremia/hypophosphatemia -Per nephrology   Irritable bowel syndrome with constipation. Intractable nausea and vomiting -Continue Amitiza Tue/SAt -Colace, Zofran as needed--change most to PRN given increasing frequency of stools  Morbid obesity Body mass index is 44.68 kg/m.        Subjective:   Bled ++ from decubitus last pm--now no blood but area moist and painful Requesting pain meds which we  will give Sat up in bed   Physical Exam: Vitals:   01/18/22 0306 01/18/22 0317 01/18/22 0332 01/18/22 0339  BP: (!) 124/47 (!) 126/51 126/66 (!) 131/44  Pulse: 67 64 67 65  Resp: 15 13 (!) 0 12   Temp:    97.7 F (36.5 C)  TempSrc:    Oral  SpO2: 100% 100% 100% 100%  Weight:      Height:       black female thick neck Mallampati 4 S1-S2 no murmur Chest is clear Decubitus ulcer seems to have some moistness and tenderness around the inner aspect--dressing pad was half saturated with blood  Data Reviewed:  No recent labs  Family Communication: Pt in room, met with daugther 10/12 at bedside  Disposition: Status is: Inpatient Remains inpatient appropriate because: Severity of illness  Planned Discharge Destination:  Unclear at this time     Author: Nita Sells, MD 01/18/2022 4:11 PM  For on call review [email or web address].

## 2022-01-18 NOTE — Progress Notes (Signed)
Received patient in bed to unit.  Alert and oriented.  Informed consent signed and in chart.   Treatment initiated: 2317 Treatment completed: 0339  Patient fairly tolerated. UF Goal of 2000 ml not obtained due to hypotension at times Transported back to the room  Alert, without acute distress.  Hand-off given to patient's nurse.   Access used: catheter Access issues: none  Total UF removed: 1400 Medication(s) given: heparin bolus Post HD VS: 97.7 131/44 66 12 100% [place] 2L Post HD weight: unable to obtain   [facility]

## 2022-01-19 LAB — RENAL FUNCTION PANEL
Albumin: 2.7 g/dL — ABNORMAL LOW (ref 3.5–5.0)
Anion gap: 13 (ref 5–15)
BUN: 44 mg/dL — ABNORMAL HIGH (ref 6–20)
CO2: 24 mmol/L (ref 22–32)
Calcium: 9.9 mg/dL (ref 8.9–10.3)
Chloride: 98 mmol/L (ref 98–111)
Creatinine, Ser: 8.99 mg/dL — ABNORMAL HIGH (ref 0.44–1.00)
GFR, Estimated: 5 mL/min — ABNORMAL LOW (ref >60)
Glucose, Bld: 91 mg/dL (ref 70–99)
Phosphorus: 7.4 mg/dL — ABNORMAL HIGH (ref 2.5–4.6)
Potassium: 6.2 mmol/L — ABNORMAL HIGH (ref 3.5–5.1)
Sodium: 135 mmol/L (ref 135–145)

## 2022-01-19 LAB — CBC WITH DIFFERENTIAL/PLATELET
Abs Immature Granulocytes: 0.03 10*3/uL (ref 0.00–0.07)
Basophils Absolute: 0.1 10*3/uL (ref 0.0–0.1)
Basophils Relative: 1 %
Eosinophils Absolute: 0.7 10*3/uL — ABNORMAL HIGH (ref 0.0–0.5)
Eosinophils Relative: 8 %
HCT: 27.4 % — ABNORMAL LOW (ref 36.0–46.0)
Hemoglobin: 8.1 g/dL — ABNORMAL LOW (ref 12.0–15.0)
Immature Granulocytes: 0 %
Lymphocytes Relative: 23 %
Lymphs Abs: 2.2 10*3/uL (ref 0.7–4.0)
MCH: 29.5 pg (ref 26.0–34.0)
MCHC: 29.6 g/dL — ABNORMAL LOW (ref 30.0–36.0)
MCV: 99.6 fL (ref 80.0–100.0)
Monocytes Absolute: 1.2 10*3/uL — ABNORMAL HIGH (ref 0.1–1.0)
Monocytes Relative: 13 %
Neutro Abs: 5.2 10*3/uL (ref 1.7–7.7)
Neutrophils Relative %: 55 %
Platelets: 245 10*3/uL (ref 150–400)
RBC: 2.75 MIL/uL — ABNORMAL LOW (ref 3.87–5.11)
RDW: 14.7 % (ref 11.5–15.5)
WBC: 9.4 10*3/uL (ref 4.0–10.5)
nRBC: 0 % (ref 0.0–0.2)

## 2022-01-19 MED ORDER — CHLORHEXIDINE GLUCONATE CLOTH 2 % EX PADS: 6.0000 | MEDICATED_PAD | Freq: Every day | CUTANEOUS | Status: DC

## 2022-01-19 MED ORDER — SODIUM ZIRCONIUM CYCLOSILICATE 10 G PO PACK
10.0000 g | PACK | Freq: Once | ORAL | Status: AC
  Administered 2022-01-19: 10 g via ORAL
  Filled 2022-01-19: qty 1

## 2022-01-19 NOTE — Progress Notes (Addendum)
Sleepy a bit today No bleeding from sacral wound No fever  Wondering why diet was changed.  Explained because of K being up--given lokelma this am and will rpt in am   No new huge changes today.  No charge

## 2022-01-19 NOTE — Progress Notes (Signed)
Subjective: Sitting up in bed no complaints currently, noted bleed from decubitus last night per admit team notes.  Hemodialysis tomorrow.  K 6.2 change regular diet to renal carb modified, Lokelma has been ordered  Objective Vital signs in last 24 hours: Vitals:   01/18/22 0339 01/18/22 1628 01/18/22 2100 01/19/22 0846  BP: (!) 131/44 (!) 123/48 (!) 127/38 (!) 122/46  Pulse: 65 72 71 65  Resp: 12 16 18 15   Temp: 97.7 F (36.5 C) 97.7 F (36.5 C) 98 F (36.7 C) 97.8 F (36.6 C)  TempSrc: Oral Oral Oral Oral  SpO2: 100% 96% 100% 100%  Weight:      Height:       Weight change:   Physical Exam: General: Obese chronically ill female NAD on 2 L nasal cannula Heart: RRR no MRG Lungs: CTA anteriorly, 2 L nasal cannula O2 100% O2 sat Abdomen: Morbid obese, NABS, soft, NTND Extremities: Bilateral AKA's trace edema Dialysis Access: L IJ TDC    OP dialysis Orders: MWF - [facility]  4hrs, BFR 400, DFR 600,  AVF, EDW 146kg, 3K/ 2Ca - last hep B labs 8/21 - Heparin 2500 units IV with HD - Hectorol 53mcg IV q HD, held as of 10/30/21   Problem/Plan: ESRD= HD schedule MWF, HD tomorrow on schedule /should plan or after lunch as she has been refusing dialysis until after lunch this week  Hyperkalemia= am  K6.2, treatment with Lokelma today on regular diet = change renal carb modified HTN/volume= BP is variable on low-dose metoprolol and midodrine metoprolol DC'd 9/01/ Last  hoyer  wt=118 kg lower at discharge Severe ischial sacral decubitus wound: Per primary/wound care team.,  Limiting her elected to sit in chair noted slow healing , see  Dr Verlon Au pic.01/17/22 COPD history of hypoxia on [place]  O2 at home(was on 5 L at baseline has been able to taper to 2-3 L [place]) Anemia of ESRD= HGB 8.1 <7.6 seems to be baseline past week on high-dose ESA q. 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 Disposition: Difficult situation. Prior to admit, patient required 6 staff members and 2 hoyer pads for transfer into HD chair as OP. Could not stay on HD for full tx. 2/2 pain of multiple decubitus ulcers and PTAR unable to lift pt into ambulance for transport. SW has been unable to locate stretcher dialysis location so far. Her wt .is much lower from admit, asked PT about trying to hoyer to recliner for HD -> apparently would need hoyer that wraps around her legs and patient is only able to tolerate <1 hour sitting up at this point./ the main issue is her Rt ischial wound. PT is using a high-quality air cushion from the CIR unit. This remains to be very painful for her to sit. Please see renal navigator note from 12/23/21. Renal navigator has reached out to several out of state facilities to see if they have onsite stretcher dialysis - waiting for return calls.  Ernest Haber, PA-C [facility] [phone] 01/19/2022,1:34 PM  LOS: 79 days   Labs: Basic Metabolic Panel: Recent Labs  Lab 01/13/22 1300 01/15/22 1517 01/19/22 0310  NA 138 137 135  K 6.0* 5.4* 6.2*  CL 101 101 98  CO2 27 26 24   GLUCOSE 91 130* 91  BUN 44* 35* 44*  CREATININE 9.81* 7.67* 8.99*  CALCIUM 9.8 9.2 9.9  PHOS 6.5* 7.4* 7.4*   Liver Function Tests: Recent Labs  Lab 01/13/22 1300 01/15/22 1517 01/19/22 0310  ALBUMIN 2.5* 2.6* 2.7*   No results for input(s): "LIPASE", "AMYLASE" in the last 168 hours. No results for input(s): "AMMONIA" in the last 168 hours. CBC: Recent Labs  Lab 01/13/22 1300 01/15/22 1517 01/19/22 0310  WBC 10.0 8.5 9.4  NEUTROABS  --   --  5.2  HGB 7.6* 7.6* 8.1*  HCT 25.2* 25.2* 27.4*  MCV 99.6 100.4* 99.6  PLT 317 267 245   Cardiac Enzymes: No results for input(s): "CKTOTAL",  "CKMB", "CKMBINDEX", "TROPONINI" in the last 168 hours. CBG: No results for input(s): "GLUCAP" in the last 168 hours.  Studies/Results: No results found. Medications:   aspirin EC  81 mg Oral Daily   atorvastatin  10 mg Oral Daily   [START ON 01/20/2022] Chlorhexidine Gluconate Cloth  6 each Topical Q0600   darbepoetin (ARANESP) injection - DIALYSIS  200 mcg Intravenous Q Wed-HD   docusate sodium  100 mg Oral BID   feeding supplement  1 Container Oral TID BM   gabapentin  100 mg Oral Q1200   gabapentin  200 mg Oral QHS   heparin injection (subcutaneous)  5,000 Units Subcutaneous Q8H   levothyroxine  100 mcg Oral Q0600   loratadine  10 mg Oral Daily   lubiprostone  8 mcg Oral Q Tue   And   lubiprostone  8 mcg Oral Q Sat   midodrine  10 mg Oral Q M,W,F-HD   pantoprazole  40 mg Oral Daily   sevelamer carbonate  3.2 g Oral TID WC   sodium zirconium cyclosilicate  10 g Oral Once

## 2022-01-20 DIAGNOSIS — N186 End stage renal disease: Secondary | ICD-10-CM | POA: Diagnosis not present

## 2022-01-20 MED ORDER — HEPARIN SODIUM (PORCINE) 1000 UNIT/ML IJ SOLN
INTRAMUSCULAR | Status: AC
  Administered 2022-01-20: 3000 [IU]
  Filled 2022-01-20: qty 3

## 2022-01-20 NOTE — Progress Notes (Signed)
[facility] Progress Note   Subjective:   Patient seen and examined at bedside.  Sleepy today.  Wakes briefly to answer questions and returns to sleep.  Denies CP, SOB, edema orthopnea and n/v/d.   Objective Vitals:   01/19/22 1643 01/19/22 2151 01/20/22 0531 01/20/22 0943  BP: (!) 113/54 (!) 140/54 (!) 128/45 (!) 140/56  Pulse: 68 70 79 65  Resp: 16 17 16 17   Temp: 97.6 F (36.4 C) 98.2 F (36.8 C) 98.4 F (36.9 C) 97.9 F (36.6 C)  TempSrc: Oral Oral Oral Oral  SpO2: 97% 100% 100% 98%  Weight:      Height:       Physical Exam General:WDWN female in NAD Heart:RRR, no mrg Lungs:CTAB, nml WOB on 2L O2 via  Abdomen:soft, NTND Extremities:b/l AKA, no stump edema Dialysis Access: [facility]   [facility] Weights   01/15/22 1430 01/15/22 1905  Weight: 116.7 kg 114.1 kg    Intake/Output Summary (Last 24 hours) at 01/20/2022 1124 Last data filed at 01/20/2022 0134 Gross per 24 hour  Intake 460 ml  Output 0 ml  Net 460 ml    Additional Objective Labs: Basic Metabolic Panel: Recent Labs  Lab 01/13/22 1300 01/15/22 1517 01/19/22 0310  NA 138 137 135  K 6.0* 5.4* 6.2*  CL 101 101 98  CO2 27 26 24   GLUCOSE 91 130* 91  BUN 44* 35* 44*  CREATININE 9.81* 7.67* 8.99*  CALCIUM 9.8 9.2 9.9  PHOS 6.5* 7.4* 7.4*   Liver Function Tests: Recent Labs  Lab 01/13/22 1300 01/15/22 1517 01/19/22 0310  ALBUMIN 2.5* 2.6* 2.7*   CBC: Recent Labs  Lab 01/13/22 1300 01/15/22 1517 01/19/22 0310  WBC 10.0 8.5 9.4  NEUTROABS  --   --  5.2  HGB 7.6* 7.6* 8.1*  HCT 25.2* 25.2* 27.4*  MCV 99.6 100.4* 99.6  PLT 317 267 245   Iron Studies:  Recent Labs    01/18/22 2045  IRON 30  TIBC 158*  FERRITIN 935*   Medications:   aspirin EC  81 mg Oral Daily   atorvastatin  10 mg Oral Daily   Chlorhexidine Gluconate Cloth  6 each Topical Q0600   Chlorhexidine Gluconate Cloth  6 each Topical Q0600   darbepoetin (ARANESP) injection - DIALYSIS  200 mcg Intravenous Q  Wed-HD   docusate sodium  100 mg Oral BID   feeding supplement  1 Container Oral TID BM   gabapentin  100 mg Oral Q1200   gabapentin  200 mg Oral QHS   heparin injection (subcutaneous)  5,000 Units Subcutaneous Q8H   levothyroxine  100 mcg Oral Q0600   loratadine  10 mg Oral Daily   lubiprostone  8 mcg Oral Q Tue   And   lubiprostone  8 mcg Oral Q Sat   midodrine  10 mg Oral Q M,W,F-HD   pantoprazole  40 mg Oral Daily   sevelamer carbonate  3.2 g Oral TID WC    Dialysis Orders: MWF - [facility]  4hrs, BFR 400, DFR 600,  AVF, EDW 146kg, 3K/ 2Ca - last hep B labs 8/21 - Heparin 2500 units IV with HD - Hectorol 69mcg IV q HD, held as of 10/30/21   Problem/Plan: ESRD- HD schedule MWF.  HD today per regular schedule.  Requesting to be scheduled after lunch, will accommodate as able.  Hyperkalemia - K 6.2 yesterday.  Lokelma given.  Changed back to renal diet.   HTN/volume- BP is variable on low-dose  metoprolol and midodrine metoprolol DC'd 9/01/ Last  hoyer  wt=118 kg lower at discharge.  Does not appear volume overloaded, continue UF as tolerated.  Severe ischial sacral decubitus wound: Per primary/wound care team.  Limiting her, noted to be slow healing.  Goal to tolerate sitting upright.  see Dr Verlon Au pic.01/17/22 COPD history of hypoxia on [place]  O2 at home(was on 5 L at baseline has been able to taper to 2-3 L [place]) Anemia of ESRD - HGB^8.1. on high-dose ESA q. Monday last saturation 16% 12/19/2021 ferritin 1287. Iron studies ordered.  Secondary hyperparathyroidism -phosphorus elevated, changed back to renal diet.  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 Disposition: Difficult situation. Prior to admit, patient required 6 staff members and 2 hoyer pads for transfer into HD chair as OP. Could not stay on HD for full tx. 2/2 pain of multiple decubitus  ulcers and PTAR unable to lift pt into ambulance for transport. SW has been unable to locate stretcher dialysis location so far. Her wt .is much lower from admit, asked PT about trying to hoyer to recliner for HD -> apparently would need hoyer that wraps around her legs and patient is only able to tolerate <1 hour sitting up at this point./ the main issue is her Rt ischial wound. PT is using a high-quality air cushion from the CIR unit. This remains to be very painful for her to sit. Please see renal navigator note from 12/23/21. Renal navigator has reached out to several out of state facilities to see if they have onsite stretcher dialysis - waiting for return calls.  Jen Mow, PA-C [facility] 01/20/2022,11:24 AM  LOS: 80 days

## 2022-01-20 NOTE — Progress Notes (Signed)
Progress Note   Patient: Ferne Ellingwood UTM:[id number] DOB: Oct 28, 1970 DOA: 11/01/2021     80 DOS: the patient was seen and examined on 01/20/2022   Brief hospital course: 51 year old F ESRD on HD TTS,  recent lengthy hospitalization for Bacteroides Thetaiotaomicron bacteremia, hepatic and splenic abscess --required amputation of third digit of left hand and underwent left AKA and had splenic drain as well as CT-guided drainage of liver abscess she had a line holiday on last admission on long-term IV antibiotics until 08/10/8125 diastolic CHF, PVD s/p bilateral AKA, IDDM-2, morbid obesity and  chronic sacral wound presenting with progressive shortness of breath in the setting of missed dialysis and admitted for hypertensive emergency  Per patient, she never missed her dialysis however according to outpatient HD records, it takes at least 4 staff members and [facility] lift for patient to be seated in the dialysis chair.    Symptoms improved with hemodialysis.  Patient underwent R UE venogram on 8/30.  Vascular surgery following for possible RUE HD access.    Patient is now waiting placement where she can get HD on stretcher that has been difficult to find.  Assessment and Plan:  Sacral decub Wound examined on 10/14 and looked like were healing well, 7 cm deep [see pictures from 10/13] 10/15 bled from area--now has stopped--will re-examine 10/17  Pain from this limits mobility and sitting--await rpt labs Cont with PT as tolerated, although no clear dispo out of hospital in sight  ESRD on HD -4 people and [facility] lift outpatient HD -Per vascular, patient postponed R aVF placement/AVG several times ---Plan for OP perm access placement -Not able to find stretcher dialysis location.  Final plan on her HD unclear. TOC continues to follow with no stretcher dialysis bed as yet  Chronic hypoxic respiratory failure: 3 L at baseline.  Now saturating at 100% on 2 L.  Reports saturation of 90. -Currently  remains on [facility]    Acute on chronic diastolic CHF/volume overload/hypertensive emergency -Nephrology managing volume with HD -Imdur and metoprolol on hold due to hypotension -Continue midodrine 10 mg Monday Wednesday Friday -Phos being managed by renal--cont Renvela 3.2 tid meals   Hyperkalemia K was elevated 10/15--patient was on Nl diet Placed on renal DM diet--labs pending  Hypotension: Resolved.  -Continue midodrine as above   Anemia of CKD: H&H relatively stable. -Transfusion threshold criteria not met -IV iron per nephrology.  Last iron level 28 and saturation ratio 16 on 9/14 -repeat with am labs   Controlled IDDM-2: A1c 5.3%.  Discontinued CBG monitoring and SSI Previously had been on glipizide which has been discontinued   Recent septic shock secondary to Bacteroides, bacteremia and liver, and splenic abscess-completed antibiotic course.   Leukocytosis: Seems chronic but improved. -Monitor intermittently   PAD s/p bilateral AKA -Continue aspirin and statin. -Requested RN to place redistribution pad under area of decub   Hyperlipidemia -Statin.    Hypomagnesemia/hypokalemia/hyponatremia/hypophosphatemia -Per nephrology   Irritable bowel syndrome with constipation. Intractable nausea and vomiting -Continue Amitiza Tue/SAt -Colace, Zofran as needed--change most to PRN given increasing frequency of stools  Morbid obesity Body mass index is 44.68 kg/m.        Subjective:   Sleepy today Labs not done as per orders No fever chills No cp   Physical Exam: Vitals:   01/19/22 1643 01/19/22 2151 01/20/22 0531 01/20/22 0943  BP: (!) 113/54 (!) 140/54 (!) 128/45 (!) 140/56  Pulse: 68 70 79 65  Resp: 16 17 16 17   Temp: 97.6  F (36.4 C) 98.2 F (36.8 C) 98.4 F (36.9 C) 97.9 F (36.6 C)  TempSrc: Oral Oral Oral Oral  SpO2: 97% 100% 100% 98%  Weight:      Height:       black female thick neck Mallampati 4 S1-S2 no murmur Chest is clear Decubitus  ulcer snot evaluated today  Data Reviewed:  No recent labs  Family Communication: Pt in room, met with daugther 10/12 at bedside  Disposition: Status is: Inpatient Remains inpatient appropriate because: Severity of illness  Planned Discharge Destination:  Unclear at this time     Author: Nita Sells, MD 01/20/2022 2:37 PM  For on call review [email or web address].

## 2022-01-21 DIAGNOSIS — N186 End stage renal disease: Secondary | ICD-10-CM | POA: Diagnosis not present

## 2022-01-21 LAB — CBC WITH DIFFERENTIAL/PLATELET
Abs Immature Granulocytes: 0.03 10*3/uL (ref 0.00–0.07)
Basophils Absolute: 0.1 10*3/uL (ref 0.0–0.1)
Basophils Relative: 1 %
Eosinophils Absolute: 0.5 10*3/uL (ref 0.0–0.5)
Eosinophils Relative: 6 %
HCT: 29.1 % — ABNORMAL LOW (ref 36.0–46.0)
Hemoglobin: 8.7 g/dL — ABNORMAL LOW (ref 12.0–15.0)
Immature Granulocytes: 0 %
Lymphocytes Relative: 14 %
Lymphs Abs: 1.2 10*3/uL (ref 0.7–4.0)
MCH: 29.2 pg (ref 26.0–34.0)
MCHC: 29.9 g/dL — ABNORMAL LOW (ref 30.0–36.0)
MCV: 97.7 fL (ref 80.0–100.0)
Monocytes Absolute: 0.8 10*3/uL (ref 0.1–1.0)
Monocytes Relative: 9 %
Neutro Abs: 6.4 10*3/uL (ref 1.7–7.7)
Neutrophils Relative %: 70 %
Platelets: 252 10*3/uL (ref 150–400)
RBC: 2.98 MIL/uL — ABNORMAL LOW (ref 3.87–5.11)
RDW: 14.6 % (ref 11.5–15.5)
WBC: 9.1 10*3/uL (ref 4.0–10.5)
nRBC: 0 % (ref 0.0–0.2)

## 2022-01-21 LAB — RENAL FUNCTION PANEL
Albumin: 2.8 g/dL — ABNORMAL LOW (ref 3.5–5.0)
Anion gap: 14 (ref 5–15)
BUN: 29 mg/dL — ABNORMAL HIGH (ref 6–20)
CO2: 23 mmol/L (ref 22–32)
Calcium: 9.5 mg/dL (ref 8.9–10.3)
Chloride: 98 mmol/L (ref 98–111)
Creatinine, Ser: 7.08 mg/dL — ABNORMAL HIGH (ref 0.44–1.00)
GFR, Estimated: 7 mL/min — ABNORMAL LOW (ref >60)
Glucose, Bld: 127 mg/dL — ABNORMAL HIGH (ref 70–99)
Phosphorus: 5.4 mg/dL — ABNORMAL HIGH (ref 2.5–4.6)
Potassium: 4.8 mmol/L (ref 3.5–5.1)
Sodium: 135 mmol/L (ref 135–145)

## 2022-01-21 MED ORDER — SENNOSIDES-DOCUSATE SODIUM 8.6-50 MG PO TABS
1.0000 | ORAL_TABLET | Freq: Every evening | ORAL | Status: DC | PRN
  Filled 2022-01-21 (×2): qty 1

## 2022-01-21 MED ORDER — SODIUM CHLORIDE 0.9 % IV SOLN
250.0000 mg | INTRAVENOUS | Status: AC
  Administered 2022-01-22 – 2022-01-27 (×3): 250 mg via INTRAVENOUS
  Filled 2022-01-21 (×5): qty 20

## 2022-01-21 MED ORDER — POLYETHYLENE GLYCOL 3350 17 G PO PACK
17.0000 g | PACK | Freq: Every day | ORAL | Status: DC
  Filled 2022-01-21 (×6): qty 1

## 2022-01-21 NOTE — Progress Notes (Signed)
Progress Note   Patient: Courtney Gray [id number] DOB: June 05, 1970 DOA: 11/01/2021     81 DOS: the patient was seen and examined on 01/21/2022   Brief hospital course: 51 year old F ESRD on HD TTS,  recent lengthy hospitalization for Bacteroides The[place]iotaomicron bacteremia, hepatic and splenic abscess --required amputation of third digit of left hand and underwent left AKA and had splenic drain as well as CT-guided drainage of liver abscess she had a line holiday on last admission on long-term IV antibiotics until 11/13/2117 diastolic CHF, PVD s/p bilateral AKA, IDDM-2, morbid obesity and  chronic sacral wound presenting with progressive shortness of breath in the setting of missed dialysis and admitted for hypertensive emergency  Per patient, she never missed her dialysis however according to outpatient HD records, it takes at least 4 staff members and [facility] lift for patient to be seated in the dialysis chair.    Symptoms improved with hemodialysis.  Patient underwent R UE venogram on 8/30.  Vascular surgery following for possible RUE HD access.    Patient is now waiting placement where she can get HD on stretcher that has been difficult to find.  Assessment and Plan:  Sacral decub Wound examined on 10/17 and looked like were healing well, from 7-->5cm deep [see pictures from 10/17] 10/15 bled from area--now has stopped Pain from this limits mobility and sitting Cont with PT as tolerated, although no clear dispo out of hospital in sight  ESRD on HD -4 people and [facility] lift outpatient HD -Per vascular, patient postponed R aVF placement/AVG several times ---Plan for OP perm access placement -Not able to find stretcher dialysis location.  Final plan on her HD unclear. TOC continues to follow with no stretcher dialysis bed as yet  Chronic hypoxic respiratory failure: 3 L at baseline-Currently remains on [facility]    Acute on chronic diastolic CHF/volume overload/hypertensive  emergency -Nephrology managing volume with HD -Imdur and metoprolol on hold due to hypotension -Continue midodrine 10 mg Monday Wednesday Friday -Phos being managed by renal--cont Renvela 3.2 tid meals   Hyperkalemia K was elevated 10/15--patient was on Nl diet Placed on renal DM diet--labs have imrpvoed although she doesn't like the diet  Hypotension: Resolved.  -Continue midodrine as above   Anemia of CKD: H&H relatively stable. -Transfusion threshold criteria not met -IV iron [facility] nephrology.  Last iron level 230, Tsat 19--as per renal   Controlled IDDM-2: A1c 5.3%.  Discontinued CBG monitoring and SSI Previously had been on glipizide which has been discontinued   Recent septic shock secondary to Bacteroides, bacteremia and liver, and splenic abscess-completed antibiotic course.   Leukocytosis: Seems chronic but improved. -Monitor intermittently   PAD s/p bilateral AKA -Continue aspirin and statin. -Requested RN to place redistribution pad under area of decub   Hyperlipidemia -Statin.    Hypomagnesemia/hypokalemia/hyponatremia/hypophosphatemia -Per nephrology   Irritable bowel syndrome with constipation. Intractable nausea and vomiting -Continue Amitiza Tue/SAt -Colace, Zofran as needed--change most to PRN given increasing frequency of stools  Morbid obesity Body mass index is 44.68 kg/m.        Subjective:   Stomach upset from Amatiza--discussed alternative would be to d/c it and use miralax and senna--she is ok with that   Physical Exam: Vitals:   01/21/22 0031 01/21/22 0036 01/21/22 0531 01/21/22 1001  BP: (!) 94/30 109/61 (!) 135/54 (!) 130/56  Pulse: 74 72 73 70  Resp: _0 Temp:  98.1 F (36.7 C) 98.2 F (36.8 C) 98.6 F (37 C)  TempSrc:  Oral Oral Oral  SpO2: 100% 100% 96% 98%  Weight:      Height:       black female thick neck Mallampati 4 S1-S2 no murmur Chest is clear Decubitus ulcer  reviewed      Data  Reviewed:  K down to 4.8  Family Communication: Pt in room, met with daugther 10/12 at bedside, none today  Disposition: Status is: Inpatient Remains inpatient appropriate because: Severity of illness  Planned Discharge Destination:  Unclear at this time     Author: Nita Sells, MD 01/21/2022 5:41 PM  For on call review [email or web address].

## 2022-01-21 NOTE — Progress Notes (Addendum)
[facility] Progress Note   Subjective:   Patient seen and examined in room.  Reports abdominal discomfort with constipation this AM.  Denies CP, SOB, n/v/d and orthopnea.  States she has not eaten this AM b/c she doesn't have a lot of breakfast options with renal diet.  Reinforced importance of renal/low K diet.    Objective Vitals:   01/21/22 0031 01/21/22 0036 01/21/22 0531 01/21/22 1001  BP: (!) 94/30 109/61 (!) 135/54 (!) 130/56  Pulse: 74 72 73 70  Resp: 17 12 18 17   Temp:  98.1 F (36.7 C) 98.2 F (36.8 C) 98.6 F (37 C)  TempSrc:  Oral Oral Oral  SpO2: 100% 100% 96% 98%  Weight:      Height:       Physical Exam General:WDWN female in NAD Heart:RRR, no mrg Lungs:CTAB, nml WOB on 2L O2 via  Abdomen:soft, NTND Extremities:b/l BKA, no edema Dialysis Access: [facility]   [employer] Weights   01/15/22 1430 01/15/22 1905  Weight: 116.7 kg 114.1 kg    Intake/Output Summary (Last 24 hours) at 01/21/2022 1134 Last data filed at 01/21/2022 0601 Gross per 24 hour  Intake 420 ml  Output 2500 ml  Net -2080 ml    Additional Objective Labs: Basic Metabolic Panel: Recent Labs  Lab 01/15/22 1517 01/19/22 0310  NA 137 135  K 5.4* 6.2*  CL 101 98  CO2 26 24  GLUCOSE 130* 91  BUN 35* 44*  CREATININE 7.67* 8.99*  CALCIUM 9.2 9.9  PHOS 7.4* 7.4*   Liver Function Tests: Recent Labs  Lab 01/15/22 1517 01/19/22 0310  ALBUMIN 2.6* 2.7*   CBC: Recent Labs  Lab 01/15/22 1517 01/19/22 0310 01/21/22 0953  WBC 8.5 9.4 9.1  NEUTROABS  --  5.2 6.4  HGB 7.6* 8.1* 8.7*  HCT 25.2* 27.4* 29.1*  MCV 100.4* 99.6 97.7  PLT 267 245 252   Iron Studies:  Recent Labs    01/18/22 2045  IRON 30  TIBC 158*  FERRITIN 935*   Lab Results  Component Value Date   INR 1.3 (H) 10/09/2021   INR 1.3 (H) 09/10/2021   INR 1.3 (H) 07/10/2021   Studies/Results: No results found.  Medications:   aspirin EC  81 mg Oral Daily   atorvastatin  10 mg Oral Daily    Chlorhexidine Gluconate Cloth  6 each Topical Q0600   Chlorhexidine Gluconate Cloth  6 each Topical Q0600   darbepoetin (ARANESP) injection - DIALYSIS  200 mcg Intravenous Q Wed-HD   docusate sodium  100 mg Oral BID   feeding supplement  1 Container Oral TID BM   gabapentin  100 mg Oral Q1200   gabapentin  200 mg Oral QHS   heparin injection (subcutaneous)  5,000 Units Subcutaneous Q8H   levothyroxine  100 mcg Oral Q0600   loratadine  10 mg Oral Daily   lubiprostone  8 mcg Oral Q Tue   And   lubiprostone  8 mcg Oral Q Sat   midodrine  10 mg Oral Q M,W,F-HD   pantoprazole  40 mg Oral Daily   sevelamer carbonate  3.2 g Oral TID WC    Dialysis Orders: MWF - [facility]  4hrs, BFR 400, DFR 600,  AVF, EDW 146kg, 3K/ 2Ca - last hep B labs 8/21 - Heparin 2500 units IV with HD - Hectorol 28mcg IV q HD, held as of 10/30/21   Problem/Plan: ESRD- HD schedule MWF.  HD tomorrow per regular schedule.  Requesting  to be scheduled after lunch, will accommodate as able.  Hyperkalemia -  K 6.2, Lokelma given & changed back to renal diet. K in goal today. Reiterated importance of low K diet.  HTN/volume- BP stable without antihypertensives.metoprolol DC'd 9/01.  On midodrine 10mg  qHD.  Last  hoyer  wt=118 kg lower at discharge.  Does not appear volume overloaded, continue UF as tolerated.  Severe ischial sacral decubitus wound: Per primary/wound care team.  Limiting her, noted to be slow healing.  Goal to tolerate sitting upright.  see Dr Verlon Au pic.01/17/22 COPD history of hypoxia on [place]  O2 at home(was on 5 L at baseline has been able to taper to 2-3 L [place]) Anemia of ESRD - HGB^8.7. on high-dose ESA q. Monday last saturation 16% 12/19/2021 ferritin 1287. Tsat 19%, order iron load.   Secondary hyperparathyroidism - phos improved with change back to renal diet. Continue Renvela 3.2gm with meals follow-up trend Cca /No Vit d /check PTH PAD s/p bilateral AKA, s/p L 3rd finger amputation for  gangrene. Nutrition - on renal diet w/fluid restrictions.  Not happy about it. Disposition: Difficult situation. Prior to admit, patient required 6 staff members and 2 hoyer pads for transfer into HD chair as OP. Could not stay on HD for full tx. 2/2 pain of multiple decubitus ulcers and PTAR unable to lift pt into ambulance for transport. SW has been unable to locate stretcher dialysis location so far. Her wt .is much lower from admit, asked PT about trying to hoyer to recliner for HD -> apparently would need hoyer that wraps around her legs and patient is only able to tolerate <1 hour sitting up at this point./ the main issue is her Rt ischial wound. PT is using a high-quality air cushion from the CIR unit. This remains to be very painful for her to sit. Please see renal navigator note from 12/23/21. Renal navigator has reached out to several out of state facilities to see if they have onsite stretcher dialysis - waiting for return calls.    Jen Mow, PA-C [facility] 01/21/2022,11:34 AM  LOS: 81 days

## 2022-01-21 NOTE — Progress Notes (Signed)
PT Cancellation Note  Patient Details Name: Courtney Gray MRN: [id number] DOB: 12-15-1970   Cancelled Treatment:    Reason Eval/Treat Not Completed: Patient not medically ready  Patient reports upset stomach and has not been able to eat anything today. Reporting frequent BMs and feels too bad to participate with therapy today.    [place]  Office [phone]   [name] 01/21/2022, 3:15 PM

## 2022-01-21 NOTE — Progress Notes (Signed)
Went in to give the pt her bath and to remove the dressing so that the dr. Lilian Coma assess her  wound and we get her ready for therapy at 3, in which pt was aware Dr. Was coming this afternoon. Patient was being very confrontational and resistant to answering questions when she was asked about the bath while we were in the room because she gets upset when night shift has to give her baths later in the night. She would ignore the RN and then when she was asked again about [place] giving her a bath she finally decided to answer whether or not she wanted a bath.

## 2022-01-22 DIAGNOSIS — I10 Essential (primary) hypertension: Secondary | ICD-10-CM | POA: Diagnosis not present

## 2022-01-22 DIAGNOSIS — E1169 Type 2 diabetes mellitus with other specified complication: Secondary | ICD-10-CM | POA: Diagnosis not present

## 2022-01-22 DIAGNOSIS — R7881 Bacteremia: Secondary | ICD-10-CM | POA: Diagnosis not present

## 2022-01-22 DIAGNOSIS — N186 End stage renal disease: Secondary | ICD-10-CM | POA: Diagnosis not present

## 2022-01-22 LAB — CBC
HCT: 26.4 % — ABNORMAL LOW (ref 36.0–46.0)
Hemoglobin: 8.1 g/dL — ABNORMAL LOW (ref 12.0–15.0)
MCH: 30.1 pg (ref 26.0–34.0)
MCHC: 30.7 g/dL (ref 30.0–36.0)
MCV: 98.1 fL (ref 80.0–100.0)
Platelets: 269 10*3/uL (ref 150–400)
RBC: 2.69 MIL/uL — ABNORMAL LOW (ref 3.87–5.11)
RDW: 14.6 % (ref 11.5–15.5)
WBC: 8.4 10*3/uL (ref 4.0–10.5)
nRBC: 0 % (ref 0.0–0.2)

## 2022-01-22 LAB — RENAL FUNCTION PANEL
Albumin: 2.6 g/dL — ABNORMAL LOW (ref 3.5–5.0)
Anion gap: 16 — ABNORMAL HIGH (ref 5–15)
BUN: 42 mg/dL — ABNORMAL HIGH (ref 6–20)
CO2: 24 mmol/L (ref 22–32)
Calcium: 9.9 mg/dL (ref 8.9–10.3)
Chloride: 97 mmol/L — ABNORMAL LOW (ref 98–111)
Creatinine, Ser: 9.68 mg/dL — ABNORMAL HIGH (ref 0.44–1.00)
GFR, Estimated: 4 mL/min — ABNORMAL LOW (ref >60)
Glucose, Bld: 149 mg/dL — ABNORMAL HIGH (ref 70–99)
Phosphorus: 7.7 mg/dL — ABNORMAL HIGH (ref 2.5–4.6)
Potassium: 5 mmol/L (ref 3.5–5.1)
Sodium: 137 mmol/L (ref 135–145)

## 2022-01-22 MED ORDER — LIDOCAINE HCL (PF) 1 % IJ SOLN: 5.0000 mL | INTRAMUSCULAR | Status: DC | PRN

## 2022-01-22 MED ORDER — ALTEPLASE 2 MG IJ SOLR: 2.0000 mg | Freq: Once | INTRAMUSCULAR | Status: DC | PRN

## 2022-01-22 MED ORDER — PENTAFLUOROPROP-TETRAFLUOROETH EX AERO: 1.0000 | INHALATION_SPRAY | CUTANEOUS | Status: DC | PRN

## 2022-01-22 MED ORDER — LIDOCAINE-PRILOCAINE 2.5-2.5 % EX CREA: 1.0000 | TOPICAL_CREAM | CUTANEOUS | Status: DC | PRN

## 2022-01-22 MED ORDER — HEPARIN SODIUM (PORCINE) 1000 UNIT/ML DIALYSIS
2500.0000 [IU] | INTRAMUSCULAR | Status: DC | PRN
  Administered 2022-01-22: 3000 [IU] via INTRAVENOUS_CENTRAL
  Filled 2022-01-22: qty 3

## 2022-01-22 MED ORDER — HEPARIN SODIUM (PORCINE) 1000 UNIT/ML DIALYSIS
1000.0000 [IU] | INTRAMUSCULAR | Status: DC | PRN
  Filled 2022-01-22: qty 1

## 2022-01-22 MED ORDER — ANTICOAGULANT SODIUM CITRATE 4% (200MG/5ML) IV SOLN: 5.0000 mL | Status: DC | PRN

## 2022-01-22 NOTE — Progress Notes (Signed)
[facility] Progress Note   Subjective:   Patient seen and examined at bedside.  Not happy with renal diet or timing of dialysis.  Discussed there is no guarantee of dialysis time or way to predict timing due to uncertainty in hospital associated with new admissions, patient acuity/emergency and amount of patients to be run.  Denies CP, SOB, abdominal pain and n/v/d.   Objective Vitals:   01/21/22 1001 01/21/22 2139 01/22/22 0600 01/22/22 0957  BP: (!) 130/56 (!) 126/98 (!) 150/67 (!) 136/58  Pulse: 70 79 87 66  Resp: 17 18 18    Temp: 98.6 F (37 C) 99 F (37.2 C) 98.2 F (36.8 C) 98.2 F (36.8 C)  TempSrc: Oral Oral Oral Oral  SpO2: 98% 100% 90% 100%  Weight:      Height:       Physical Exam General:WDWN female in NAD Heart:RRR, no mrg Lungs:CTAB, nml WOB on RA Abdomen:soft, NTND Extremities:no LE edema, b/l AKA Dialysis Access: [facility]   [facility] Weights   01/15/22 1430 01/15/22 1905  Weight: 116.7 kg 114.1 kg    Intake/Output Summary (Last 24 hours) at 01/22/2022 1129 Last data filed at 01/22/2022 0900 Gross per 24 hour  Intake 660 ml  Output 0 ml  Net 660 ml    Additional Objective Labs: Basic Metabolic Panel: Recent Labs  Lab 01/15/22 1517 01/19/22 0310 01/21/22 0953  NA 137 135 135  K 5.4* 6.2* 4.8  CL 101 98 98  CO2 26 24 23   GLUCOSE 130* 91 127*  BUN 35* 44* 29*  CREATININE 7.67* 8.99* 7.08*  CALCIUM 9.2 9.9 9.5  PHOS 7.4* 7.4* 5.4*   Liver Function Tests: Recent Labs  Lab 01/15/22 1517 01/19/22 0310 01/21/22 0953  ALBUMIN 2.6* 2.7* 2.8*   CBC: Recent Labs  Lab 01/15/22 1517 01/19/22 0310 01/21/22 0953  WBC 8.5 9.4 9.1  NEUTROABS  --  5.2 6.4  HGB 7.6* 8.1* 8.7*  HCT 25.2* 27.4* 29.1*  MCV 100.4* 99.6 97.7  PLT 267 245 252   Medications:  ferric gluconate (FERRLECIT) IVPB      aspirin EC  81 mg Oral Daily   atorvastatin  10 mg Oral Daily   Chlorhexidine Gluconate Cloth  6 each Topical Q0600   Chlorhexidine  Gluconate Cloth  6 each Topical Q0600   darbepoetin (ARANESP) injection - DIALYSIS  200 mcg Intravenous Q Wed-HD   docusate sodium  100 mg Oral BID   feeding supplement  1 Container Oral TID BM   gabapentin  100 mg Oral Q1200   gabapentin  200 mg Oral QHS   heparin injection (subcutaneous)  5,000 Units Subcutaneous Q8H   levothyroxine  100 mcg Oral Q0600   loratadine  10 mg Oral Daily   midodrine  10 mg Oral Q M,W,F-HD   pantoprazole  40 mg Oral Daily   polyethylene glycol  17 g Oral Daily   sevelamer carbonate  3.2 g Oral TID WC    Dialysis Orders: MWF - [facility]  4hrs, BFR 400, DFR 600,  AVF, EDW 146kg, 3K/ 2Ca - last hep B labs 8/21 - Heparin 2500 units IV with HD - Hectorol 8mcg IV q HD, held as of 10/30/21   Problem/Plan: ESRD- HD schedule MWF.  HD today per regular schedule.  Requesting to be scheduled after lunch, will accommodate as able.  Hyperkalemia -  resolved. K elevated w/regular diet. Lokelma given & changed back to renal diet.  Reiterated importance of low K diet.  HTN/volume- BP stable without antihypertensives.metoprolol DC'd 9/01.  On midodrine 10mg  qHD.  Last  hoyer  wt=118 kg lower at discharge.  Does not appear volume overloaded, continue UF as tolerated.  Severe ischial sacral decubitus wound: Per primary/wound care team.  Limiting her, noted to be slow healing.  Goal to tolerate sitting upright.  see Dr Verlon Au pic.01/17/22 COPD history of hypoxia on [place]  O2 at home(was on 5 L at baseline has been able to taper to 2-3 L [place]) Anemia of ESRD - HGB^8.7. on high-dose ESA q. Monday last saturation 16% 12/19/2021 ferritin 1287. Tsat 19%, order iron load.   Secondary hyperparathyroidism - phos improved with change back to renal diet. 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 Nutrition - on renal diet w/fluid restrictions.  Not happy about it. Disposition:  Difficult situation. Prior to admit, patient required 6 staff members and 2 hoyer pads for transfer into HD chair as OP. Could not stay on HD for full tx. 2/2 pain of multiple decubitus ulcers and PTAR unable to lift pt into ambulance for transport. SW has been unable to locate stretcher dialysis location so far. Her wt .is much lower from admit, asked PT about trying to hoyer to recliner for HD -> apparently would need hoyer that wraps around her legs and patient is only able to tolerate <1 hour sitting up at this point./ the main issue is her Rt ischial wound. PT is using a high-quality air cushion from the CIR unit. This remains to be very painful for her to sit. Please see renal navigator note from 12/23/21. Renal navigator has reached out to several out of state facilities to see if they have onsite stretcher dialysis - waiting for return calls.  Jen Mow, PA-C [facility] 01/22/2022,11:29 AM  LOS: 82 days

## 2022-01-22 NOTE — Progress Notes (Signed)
  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 and underwent left AKA and had splenic drain as well as CT-guided drainage of liver abscess she had a line holiday on last admission on long-term IV antibiotics until 08/09/4918 diastolic CHF, PVD s/p bilateral AKA, IDDM-2, morbid obesity and  chronic sacral wound presenting with progressive shortness of breath in the setting of missed dialysis and admitted for hypertensive emergency  Per patient, she never missed her dialysis however according to outpatient HD records, it takes at least 4 staff members and [facility] lift for patient to be seated in the dialysis chair.    Symptoms improved with hemodialysis.  Patient underwent R UE venogram on 8/30.  Vascular surgery following for possible RUE HD access.    Patient is now waiting placement where she can get HD on stretcher that has been difficult to find.  Assessment and Plan: * End stage renal disease [facility]) Patient will continue renal replacement therapy per nephrology recommendations.  Needs placement to continue with HD as outpatient.  Very deconditioned.   Anemia of chronic kidney disease, Hgb has been stable.  Continue with EPO and Iron supplementation.   Hypertension Continue blood pressure control with isosorbide and metoprolol.   Decubitus ulcer of sacral region, stage 2 ([place]) Present on admission. Continue with local care.   Type 2 diabetes mellitus with hyperlipidemia (HCC) Continue glucose cover and monitoring  Sliding scale insulin,   Statin therapy with atorvastatin.   Bacteremia Patient has completed antibiotic therapy. She has a abdominal drain in place.   Continue close follow up as outpatient.   PVD (peripheral vascular disease) (HCC) Bilateral AKA.   Plan for follow up with vascular surgery Dr. Stanford Breed on 11/21/21 for stable removal. Continue local wound care.    Class 3 obesity (HCC) Calculated BMI is 65,3         Subjective: Patient with no chest pain or dyspnea, continue toe very weak and deconditioned   Physical Exam: Vitals:   01/22/22 0600 01/22/22 0957 01/22/22 1441 01/22/22 1536  BP: (!) 150/67 (!) 136/58 (!) 162/115   Pulse: 87 66 79   Resp: 18     Temp: 98.2 F (36.8 C) 98.2 F (36.8 C) 98.5 F (36.9 C) 98.1 F (36.7 C)  TempSrc: Oral Oral Oral Oral  SpO2: 90% 100% 96%   Weight:      Height:       Neurology awake and alert ENT with mild pallor Cardiovascular with S1 and S2 present Respiratory with no wheezing Abdomen protuberant but not distended Positive non pitting lower extremity edema  Data Reviewed:    Family Communication: no family at the bedside   Disposition: Status is: Inpatient Remains inpatient appropriate because: pending placement   Planned Discharge Destination:  to be determined      Author: Tawni Millers, MD 01/22/2022 3:48 PM  For on call review [email or web address].

## 2022-01-22 NOTE — Plan of Care (Signed)
  Problem: Skin Integrity: Goal: Risk for impaired skin integrity will decrease Outcome: Progressing   Problem: Education: Goal: Knowledge of General Education information will improve Description: Including pain rating scale, medication(s)/side effects and non-pharmacologic comfort measures Outcome: Progressing   Problem: Health Behavior/Discharge Planning: Goal: Ability to manage health-related needs will improve Outcome: Progressing   Problem: Clinical Measurements: Goal: Ability to maintain clinical measurements within normal limits will improve Outcome: Progressing Goal: Will remain free from infection Outcome: Progressing Goal: Diagnostic test results will improve Outcome: Progressing Goal: Respiratory complications will improve Outcome: Progressing Goal: Cardiovascular complication will be avoided Outcome: Progressing   Problem: Activity: Goal: Risk for activity intolerance will decrease Outcome: Progressing   Problem: Nutrition: Goal: Adequate nutrition will be maintained Outcome: Progressing   Problem: Coping: Goal: Level of anxiety will decrease Outcome: Progressing   Problem: Elimination: Goal: Will not experience complications related to bowel motility Outcome: Progressing Goal: Will not experience complications related to urinary retention Outcome: Progressing   Problem: Pain Managment: Goal: General experience of comfort will improve Outcome: Progressing   Problem: Pain Managment: Goal: General experience of comfort will improve Outcome: Progressing   Problem: Safety: Goal: Ability to remain free from injury will improve Outcome: Progressing   Problem: Skin Integrity: Goal: Risk for impaired skin integrity will decrease Outcome: Progressing

## 2022-01-23 DIAGNOSIS — I10 Essential (primary) hypertension: Secondary | ICD-10-CM | POA: Diagnosis not present

## 2022-01-23 DIAGNOSIS — E1169 Type 2 diabetes mellitus with other specified complication: Secondary | ICD-10-CM | POA: Diagnosis not present

## 2022-01-23 DIAGNOSIS — N186 End stage renal disease: Secondary | ICD-10-CM | POA: Diagnosis not present

## 2022-01-23 DIAGNOSIS — R7881 Bacteremia: Secondary | ICD-10-CM | POA: Diagnosis not present

## 2022-01-23 LAB — HEPATITIS B E ANTIBODY: Hep B E Ab: NEGATIVE

## 2022-01-23 MED ORDER — CHLORHEXIDINE GLUCONATE CLOTH 2 % EX PADS
6.0000 | MEDICATED_PAD | Freq: Every day | CUTANEOUS | Status: DC
  Administered 2022-01-25: 6 via TOPICAL

## 2022-01-23 NOTE — Progress Notes (Addendum)
[facility] Progress Note   Subjective:   Patient seen and examined at bedside.  No new complaints.   Objective Vitals:   01/22/22 2254 01/23/22 0636 01/23/22 0910 01/23/22 0911  BP: (!) 117/40 (!) 114/33 (!) 101/51   Pulse: 75 74 74 75  Resp: 18  18 18   Temp: 98.3 F (36.8 C) 98.8 F (37.1 C) 97.6 F (36.4 C)   TempSrc: Oral Oral Oral   SpO2: 100% 100%  100%  Weight:      Height:       Physical Exam General:WDWN female in NAD Heart:RRR, no MRG Lungs:+CTAB anterolaterally, nml WOB 2L O2 via [place] Abdomen:soft, NTND Extremities:no LE edema, b/l AKA Dialysis Access: [facility]   [facility] Weights   01/15/22 1430 01/15/22 1905  Weight: 116.7 kg 114.1 kg    Intake/Output Summary (Last 24 hours) at 01/23/2022 1023 Last data filed at 01/23/2022 0800 Gross per 24 hour  Intake 120 ml  Output 2400 ml  Net -2280 ml    Additional Objective Labs: Basic Metabolic Panel: Recent Labs  Lab 01/19/22 0310 01/21/22 0953 01/22/22 1414  NA 135 135 137  K 6.2* 4.8 5.0  CL 98 98 97*  CO2 24 23 24   GLUCOSE 91 127* 149*  BUN 44* 29* 42*  CREATININE 8.99* 7.08* 9.68*  CALCIUM 9.9 9.5 9.9  PHOS 7.4* 5.4* 7.7*   Liver Function Tests: Recent Labs  Lab 01/19/22 0310 01/21/22 0953 01/22/22 1414  ALBUMIN 2.7* 2.8* 2.6*    CBC: Recent Labs  Lab 01/19/22 0310 01/21/22 0953 01/22/22 1414  WBC 9.4 9.1 8.4  NEUTROABS 5.2 6.4  --   HGB 8.1* 8.7* 8.1*  HCT 27.4* 29.1* 26.4*  MCV 99.6 97.7 98.1  PLT 245 252 269   Blood Culture    Component Value Date/Time   SDES BLOOD LEFT ANTECUBITAL 10/16/2021 1534   SPECREQUEST  10/16/2021 1534    BOTTLES DRAWN AEROBIC AND ANAEROBIC Blood Culture results may not be optimal due to an inadequate volume of blood received in culture bottles   CULT  10/16/2021 1534    NO GROWTH 5 DAYS Performed at [facility] [address], [place],  [address]    REPTSTATUS 10/21/2021 FINAL 10/16/2021 1534    Studies/Results: No  results found.  Medications:  ferric gluconate (FERRLECIT) IVPB Stopped (01/23/22 0746)    aspirin EC  81 mg Oral Daily   atorvastatin  10 mg Oral Daily   Chlorhexidine Gluconate Cloth  6 each Topical Q0600   Chlorhexidine Gluconate Cloth  6 each Topical Q0600   darbepoetin (ARANESP) injection - DIALYSIS  200 mcg Intravenous Q Wed-HD   docusate sodium  100 mg Oral BID   feeding supplement  1 Container Oral TID BM   gabapentin  100 mg Oral Q1200   gabapentin  200 mg Oral QHS   heparin injection (subcutaneous)  5,000 Units Subcutaneous Q8H   levothyroxine  100 mcg Oral Q0600   loratadine  10 mg Oral Daily   midodrine  10 mg Oral Q M,W,F-HD   pantoprazole  40 mg Oral Daily   polyethylene glycol  17 g Oral Daily   sevelamer carbonate  3.2 g Oral TID WC    Dialysis Orders: MWF - [facility]  4hrs, BFR 400, DFR 600,  AVF, EDW 146kg, 3K/ 2Ca - last hep B labs 8/21 - Heparin 2500 units IV with HD - Hectorol 68mcg IV q HD, held as of 10/30/21   Problem/Plan:  ESRD- HD schedule MWF.  HD tomorrow per regular schedule.  Requesting to be scheduled after lunch, will accommodate as able.  Hyperkalemia -  resolved. K elevated w/regular diet. Lokelma given & changed back to renal diet.  Reiterated importance of low K diet.  HTN/volume- BP stable without antihypertensives.metoprolol DC'd 9/01.  On midodrine 10mg  qHD.  Last  hoyer  wt=118 kg lower at discharge.  Does not appear volume overloaded, continue UF as tolerated.  Severe ischial sacral decubitus wound: Per primary/wound care team.  Limiting her, noted to be slow healing.  Goal to tolerate sitting upright.  see Dr Verlon Au pic.01/17/22 COPD history of hypoxia on [place]  O2 at home(was on 5 L at baseline has been able to taper to 2-3 L [place]) Anemia of ESRD - HGB 8.1. on high-dose ESA q. Monday last saturation 16% 12/19/2021 ferritin 1287. Tsat 19%, order iron load.   Secondary hyperparathyroidism - phos improved with change back to renal  diet. 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 Nutrition - on renal diet w/fluid restrictions.  Not happy about it. Disposition: Difficult situation. Prior to admit, patient required 6 staff members and 2 hoyer pads for transfer into HD chair as OP. Could not stay on HD for full tx. 2/2 pain of multiple decubitus ulcers and PTAR unable to lift pt into ambulance for transport. SW has been unable to locate stretcher dialysis location so far. Her wt .is much lower from admit, asked PT about trying to hoyer to recliner for HD -> apparently would need hoyer that wraps around her legs and patient is only able to tolerate <1 hour sitting up at this point./ the main issue is her Rt ischial wound. PT is using a high-quality air cushion from the CIR unit. This remains to be very painful for her to sit. Please see renal navigator note from 12/23/21. Renal navigator has reached out to several out of state facilities to see if they have onsite stretcher dialysis - waiting for return calls.  Jen Mow, PA-C [facility] 01/23/2022,10:23 AM  LOS: 83 days

## 2022-01-23 NOTE — Progress Notes (Signed)
  Progress Note   Patient: Courtney Gray [id number]:[id number] DOB: 1970/09/14 DOA: 11/01/2021     83 DOS: the patient was seen and examined on 01/23/2022   Brief hospital course: 51 year old F ESRD on HD TTS,  recent lengthy hospitalization for Bacteroides Thetaiotaomicron bacteremia, hepatic and splenic abscess --required amputation of third digit of left hand and underwent left AKA and had splenic drain as well as CT-guided drainage of liver abscess she had a line holiday on last admission on long-term IV antibiotics until 06/12/1694 diastolic CHF, PVD s/p bilateral AKA, IDDM-2, morbid obesity and  chronic sacral wound presenting with progressive shortness of breath in the setting of missed dialysis and admitted for hypertensive emergency  Per patient, she never missed her dialysis however according to outpatient HD records, it takes at least 4 staff members and [facility] lift for patient to be seated in the dialysis chair.    Symptoms improved with hemodialysis.  Patient underwent R UE venogram on 8/30.  Vascular surgery following for possible RUE HD access.    Patient is now waiting placement where she can get HD on stretcher that has been difficult to find.  Assessment and Plan: * End stage renal disease [facility]) Patient will continue renal replacement therapy per nephrology recommendations.  Needs placement to continue with HD as outpatient.  Very deconditioned.   Anemia of chronic kidney disease, Hgb has been stable.  Continue with EPO and Iron supplementation.   Metabolic bone disease, continue with sevelamer.   Hypertension Patient on midodrine on HD to prevent hypotension. Currently off antihypertensive medications.   Decubitus ulcer of sacral region, stage 2 ([place]) Present on admission. Continue with local care.   Type 2 diabetes mellitus with hyperlipidemia (HCC) Her glucose has been stable and insulin therapy has been discontinued.   Statin therapy with atorvastatin.    Bacteremia Patient has completed antibiotic therapy. She has a abdominal drain in place.  Continue close follow up as outpatient.   PVD (peripheral vascular disease) (HCC) Bilateral AKA.   Continue local wound care.    Class 3 obesity (HCC) Calculated BMI is 65,3         Subjective: Patient with no chest pain or dyspnea, she had today HD with good toleration   Physical Exam: Vitals:   01/23/22 0636 01/23/22 0910 01/23/22 0911 01/23/22 1450  BP: (!) 114/33 (!) 101/51  (!) 103/48  Pulse: 74 74 75 72  Resp:  18 18 16   Temp: 98.8 F (37.1 C) 97.6 F (36.4 C)  98.9 F (37.2 C)  TempSrc: Oral Oral  Oral  SpO2: 100%  100% 100%  Weight:      Height:       Neurology awake and alert ENT with mild pallor Cardiovascular with S1 and S2 present and rhythmic Respiratory with no rales or wheezing Abdomen with no distention  Bilateral AKA Data Reviewed:    Family Communication: no family at the bedside   Disposition: Status is: Inpatient Remains inpatient appropriate because: pending placement   Planned Discharge Destination:  to be determined     Author: Tawni Millers, MD 01/23/2022 3:39 PM  For on call review [email or web address].

## 2022-01-23 NOTE — Progress Notes (Signed)
Physical Therapy Treatment Patient Details Name: Courtney Gray MRN: [id number] DOB: [birth year] Today's Date: 01/23/2022   History of Present Illness Pt adm 7/28 with acute pulmonary edema. 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    PT Comments    Patient eager to get into wheelchair today. After some difficulty getting pad appropriately placed, able to lift pt to wide w/c with bil amputee sling and maxisky lift. Patient positioned on Roho cushion and felt rt ischium uncomfortable on Roho and lifted up in maxisky to place pillow on top of Roho. Patient then propelled 348 ft without rest break with bil UEs on flat, level ground. After resting ~5 minutes, propelled 187 ft and then rode the rest of the way back to her room. She did practice maneuvering around obstacles today with good accuracy except when going slightly downhill (she veered to her right accidentally).     Recommendations for follow up therapy are one component of a multi-disciplinary discharge planning process, led by the attending physician.  Recommendations may be updated based on patient status, additional functional criteria and insurance authorization.  Follow Up Recommendations  Skilled nursing-short term rehab (<3 hours/day) Can patient physically be transported by private vehicle: No   Assistance Recommended at Discharge Intermittent Supervision/Assistance  Patient can return home with the following Two people to help with walking and/or transfers;Two people to help with bathing/dressing/bathroom;Assist for transportation;Assistance with cooking/housework;Help with stairs or ramp for entrance   Equipment Recommendations  Wheelchair (measurements PT);Wheelchair cushion (measurements PT);Other (comment);Hospital bed (Needs wide w/c with cushion and anti-tippers with ?offset wheels to reduce risk of tipping backwards)     Recommendations for Other Services       Precautions / Restrictions Precautions Precautions: Fall Precaution Comments: bil AKA, 2L O2 at baseline, LUQ drain Restrictions Weight Bearing Restrictions: No RUE Weight Bearing: Weight bearing as tolerated LUE Weight Bearing: Weight bearing as tolerated RLE Weight Bearing: Non weight bearing LLE Weight Bearing: Non weight bearing     Mobility  Bed Mobility Overal bed mobility: Needs Assistance Bed Mobility: Rolling Rolling: Supervision         General bed mobility comments: using rails; prior to OOB supervision for placing pad; when removing pad pt with incr pain and required assist to fully roll    Transfers Overall transfer level: Needs assistance Equipment used:  (maxisky) Transfers: Bed to chair/wheelchair/BSC             General transfer comment: lifted to and from wheelchair with [employer] and amputee pad and +2 assist Transfer via Lift Equipment: [place] (with amputee lift pad)  Ambulation/Gait               General Gait Details: unable   Theme park manager mobility: Yes Wheelchair propulsion: Both upper extremities Wheelchair parts: Independent Distance: 348 (rest, 182 ft) Wheelchair Assistance Details (indicate cue type and reason): pt also maneuvering around obstacles on left and right with good accuracy EXCEPT when rolling slightly downhill and veered to her right  Modified Rankin (Stroke Patients Only)       Balance Overall balance assessment: Needs assistance Sitting-balance support: No upper extremity supported Sitting balance-[name] Scale: Good Sitting balance - Comments: in wheelchair; pt requests chest strap (made with gait belts) due to fear of falling  Cognition Arousal/Alertness: Awake/alert Behavior During Therapy: WFL for tasks assessed/performed Overall Cognitive Status:  Within Functional Limits for tasks assessed                                          Exercises      General Comments General comments (skin integrity, edema, etc.): Increased time preparing pads for lifting and replacing pillows for comfort at end of session      Pertinent Vitals/Pain Pain Assessment Pain Assessment: Faces Faces Pain Scale: Hurts little more Pain Location: rt ischial wound Pain Descriptors / Indicators: Guarding, Sore, Moaning Pain Intervention(s): Limited activity within patient's tolerance, Premedicated before session    Home Living                          Prior Function            PT Goals (current goals can now be found in the care plan section) Acute Rehab PT Goals Patient Stated Goal: Pt wants to get up into a w/c PT Goal Formulation: With patient Time For Goal Achievement: 02/06/22 Potential to Achieve Goals: Good Progress towards PT goals: Progressing toward goals    Frequency    Min 2X/week      PT Plan Current plan remains appropriate    Co-evaluation              AM-PAC PT "6 Clicks" Mobility   Outcome Measure  Help needed turning from your back to your side while in a flat bed without using bedrails?: A Little Help needed moving from lying on your back to sitting on the side of a flat bed without using bedrails?: A Lot Help needed moving to and from a bed to a chair (including a wheelchair)?: Total Help needed standing up from a chair using your arms (e.g., wheelchair or bedside chair)?: Total Help needed to walk in hospital room?: Total Help needed climbing 3-5 steps with a railing? : Total 6 Click Score: 9    End of Session Equipment Utilized During Treatment: Oxygen Activity Tolerance: Patient tolerated treatment well Patient left: with call bell/phone within reach;in bed;with nursing/sitter in room   PT Visit Diagnosis: Other abnormalities of gait and mobility (R26.89);Muscle weakness  (generalized) (M62.81) Pain - Right/Left: Right Pain - part of body: Leg (ischial wound)     Time: 1500-1611 PT Time Calculation (min) (ACUTE ONLY): 71 min  Charges:  $Therapeutic Activity: 8-22 mins $Wheel Chair Management: 53-67 mins                      [place]  Office [phone]    [name] 01/23/2022, 4:42 PM

## 2022-01-24 DIAGNOSIS — R7881 Bacteremia: Secondary | ICD-10-CM | POA: Diagnosis not present

## 2022-01-24 DIAGNOSIS — I10 Essential (primary) hypertension: Secondary | ICD-10-CM | POA: Diagnosis not present

## 2022-01-24 DIAGNOSIS — N186 End stage renal disease: Secondary | ICD-10-CM | POA: Diagnosis not present

## 2022-01-24 DIAGNOSIS — E1169 Type 2 diabetes mellitus with other specified complication: Secondary | ICD-10-CM | POA: Diagnosis not present

## 2022-01-24 LAB — CBC
HCT: 25.5 % — ABNORMAL LOW (ref 36.0–46.0)
Hemoglobin: 7.5 g/dL — ABNORMAL LOW (ref 12.0–15.0)
MCH: 29.5 pg (ref 26.0–34.0)
MCHC: 29.4 g/dL — ABNORMAL LOW (ref 30.0–36.0)
MCV: 100.4 fL — ABNORMAL HIGH (ref 80.0–100.0)
Platelets: 263 10*3/uL (ref 150–400)
RBC: 2.54 MIL/uL — ABNORMAL LOW (ref 3.87–5.11)
RDW: 14.5 % (ref 11.5–15.5)
WBC: 9.2 10*3/uL (ref 4.0–10.5)
nRBC: 0 % (ref 0.0–0.2)

## 2022-01-24 LAB — RENAL FUNCTION PANEL
Albumin: 2.7 g/dL — ABNORMAL LOW (ref 3.5–5.0)
Anion gap: 13 (ref 5–15)
BUN: 37 mg/dL — ABNORMAL HIGH (ref 6–20)
CO2: 25 mmol/L (ref 22–32)
Calcium: 9.3 mg/dL (ref 8.9–10.3)
Chloride: 99 mmol/L (ref 98–111)
Creatinine, Ser: 8 mg/dL — ABNORMAL HIGH (ref 0.44–1.00)
GFR, Estimated: 6 mL/min — ABNORMAL LOW (ref >60)
Glucose, Bld: 105 mg/dL — ABNORMAL HIGH (ref 70–99)
Phosphorus: 7 mg/dL — ABNORMAL HIGH (ref 2.5–4.6)
Potassium: 5 mmol/L (ref 3.5–5.1)
Sodium: 137 mmol/L (ref 135–145)

## 2022-01-24 MED ORDER — HEPARIN SODIUM (PORCINE) 1000 UNIT/ML IJ SOLN
INTRAMUSCULAR | Status: AC
  Filled 2022-01-24: qty 4

## 2022-01-24 MED ORDER — DARBEPOETIN ALFA 200 MCG/0.4ML IJ SOSY: 200.0000 ug | PREFILLED_SYRINGE | INTRAMUSCULAR | Status: DC

## 2022-01-24 MED ORDER — DARBEPOETIN ALFA 200 MCG/0.4ML IJ SOSY
200.0000 ug | PREFILLED_SYRINGE | INTRAMUSCULAR | Status: DC
  Administered 2022-01-24: 200 ug via INTRAVENOUS
  Filled 2022-01-24: qty 0.4

## 2022-01-24 MED ORDER — HEPARIN SODIUM (PORCINE) 1000 UNIT/ML DIALYSIS
4500.0000 [IU] | INTRAMUSCULAR | Status: DC | PRN
  Administered 2022-01-24: 4500 [IU] via INTRAVENOUS_CENTRAL
  Filled 2022-01-24: qty 5

## 2022-01-24 MED ORDER — ALTEPLASE 2 MG IJ SOLR: 2.0000 mg | Freq: Once | INTRAMUSCULAR | Status: DC | PRN

## 2022-01-24 MED ORDER — ANTICOAGULANT SODIUM CITRATE 4% (200MG/5ML) IV SOLN: 5.0000 mL | Status: DC | PRN

## 2022-01-24 MED ORDER — LIDOCAINE HCL (PF) 1 % IJ SOLN: 5.0000 mL | INTRAMUSCULAR | Status: DC | PRN

## 2022-01-24 MED ORDER — PENTAFLUOROPROP-TETRAFLUOROETH EX AERO: 1.0000 | INHALATION_SPRAY | CUTANEOUS | Status: DC | PRN

## 2022-01-24 MED ORDER — HEPARIN SODIUM (PORCINE) 1000 UNIT/ML IJ SOLN
INTRAMUSCULAR | Status: AC
  Filled 2022-01-24: qty 5

## 2022-01-24 MED ORDER — LIDOCAINE-PRILOCAINE 2.5-2.5 % EX CREA: 1.0000 | TOPICAL_CREAM | CUTANEOUS | Status: DC | PRN

## 2022-01-24 MED ORDER — HEPARIN SODIUM (PORCINE) 1000 UNIT/ML DIALYSIS
1000.0000 [IU] | INTRAMUSCULAR | Status: DC | PRN
  Administered 2022-01-24: 1000 [IU]

## 2022-01-24 NOTE — Progress Notes (Signed)
  Progress Note   Patient: Courtney Gray [id number]:[id number] DOB: Jun 24, 1970 DOA: 11/01/2021     84 DOS: the patient was seen and examined on 01/24/2022   Brief hospital course: 51 year old F ESRD on HD TTS,  recent lengthy hospitalization for Bacteroides Thetaiotaomicron bacteremia, hepatic and splenic abscess --required amputation of third digit of left hand and underwent left AKA and had splenic drain as well as CT-guided drainage of liver abscess she had a line holiday on last admission on long-term IV antibiotics until 06/10/6292 diastolic CHF, PVD s/p bilateral AKA, IDDM-2, morbid obesity and  chronic sacral wound presenting with progressive shortness of breath in the setting of missed dialysis and admitted for hypertensive emergency  Per patient, she never missed her dialysis however according to outpatient HD records, it takes at least 4 staff members and [facility] lift for patient to be seated in the dialysis chair.    Symptoms improved with hemodialysis.  Patient underwent R UE venogram on 8/30.  Vascular surgery following for possible RUE HD access.    Patient is now waiting placement where she can get HD on stretcher that has been difficult to find.  Assessment and Plan: * End stage renal disease [facility]) Patient will continue renal replacement therapy per nephrology recommendations.  Needs placement to continue with HD as outpatient.  Very deconditioned.   Anemia of chronic kidney disease, Hgb has been stable.  Continue with EPO and Iron supplementation.   Metabolic bone disease, continue with sevelamer.   Hypertension Patient on midodrine on HD to prevent hypotension. Currently off antihypertensive medications.   Decubitus ulcer of sacral region, stage 2 ([place]) Present on admission. Continue with local care.   Type 2 diabetes mellitus with hyperlipidemia (HCC) Her glucose has been stable and insulin therapy has been discontinued.   Statin therapy with atorvastatin.    Bacteremia Patient has completed antibiotic therapy. She has a abdominal drain in place.  Continue close follow up as outpatient.   PVD (peripheral vascular disease) (HCC) Bilateral AKA.   Continue local wound care.    Class 3 obesity (HCC) Calculated BMI is 65,3         Subjective: patient with no chest pain or dyspnea   Physical Exam: Vitals:   01/23/22 1450 01/23/22 2125 01/24/22 0455 01/24/22 0921  BP: (!) 103/48 110/79 (!) 100/42 (!) 139/53  Pulse: 72 86 77 77  Resp: 16 18 19 16   Temp: 98.9 F (37.2 C) 99 F (37.2 C) 98.6 F (37 C) 98.4 F (36.9 C)  TempSrc: Oral Oral Oral Oral  SpO2: 100% 100% 100% 99%  Weight:      Height:       Neurology awake and alert ENT with no pallor Cardiovascular with S1 and S2 present and rhythmic Respiratory with no wheezing Abdomen with no distention  Data Reviewed:    Family Communication: no family at the bedside   Disposition: Status is: Inpatient Remains inpatient appropriate because: pending placement   Planned Discharge Destination:  to be determined       Author: Tawni Millers, MD 01/24/2022 4:48 PM  For on call review [email or web address].

## 2022-01-24 NOTE — TOC Progression Note (Signed)
Transition of Care [facility]) - Initial/Assessment Note    Patient Details  Name: Courtney Gray MRN: [id number] Date of Birth: 24-Dec-1970  Transition of Care [facility]) CM/SW Contact:    Milinda Antis, LCSWA Phone Number: 01/24/2022, 11:46 AM  Clinical Narrative:                 No new discharge updates. Patient still requiring stretcher dialysis.  Unable to locate an outpatient dialysis clinic that will accept.    TOC will continue to follow.    Expected Discharge Plan: Long Term Nursing Home Barriers to Discharge: No SNF bed, Transportation, Waiting for outpatient dialysis   Patient Goals and CMS Choice Patient states their goals for this hospitalization and ongoing recovery are:: To be at a facility close to her daughter      Expected Discharge Plan and Services Expected Discharge Plan: [place]       Living arrangements for the past 2 months: Single Family Home                                      Prior Living Arrangements/Services Living arrangements for the past 2 months: Single Family Home Lives with:: Adult Children Patient language and need for interpreter reviewed:: Yes        Need for Family Participation in Patient Care: Yes (Comment) Care giver support system in place?: No (comment)   Criminal Activity/Legal Involvement Pertinent to Current Situation/Hospitalization: No - Comment as needed  Activities of Daily Living Home Assistive Devices/Equipment: [employer] fast streamer ADL Screening (condition at time of admission) Patient's cognitive ability adequate to safely complete daily activities?: No Is the patient deaf or have difficulty hearing?: No Does the patient have difficulty seeing, even when wearing glasses/contacts?: No Does the patient have difficulty concentrating, remembering, or making decisions?: No Patient able to express need for assistance with ADLs?: Yes Does the patient have difficulty dressing or bathing?: Yes Independently  performs ADLs?: No Does the patient have difficulty walking or climbing stairs?: Yes Weakness of Legs: Both Weakness of Arms/Hands: Both  Permission Sought/Granted Permission sought to share information with : Other (comment) Permission granted to share information with : Yes, Verbal Permission Granted  Share Information with NAME: Cheema,antoinette (Daughter)   [phone]  Permission granted to share info w AGENCY: SNF        Emotional Assessment Appearance:: Appears older than stated age Attitude/Demeanor/Rapport: Engaged Affect (typically observed): Tearful/Crying, Accepting Orientation: : Oriented to Situation, Oriented to  Time, Oriented to Place, Oriented to Self Alcohol / Substance Use: Not Applicable Psych Involvement: No (comment)  Admission diagnosis:  End stage renal disease ([place]) [N18.6] Fluid overload [E87.70] Obesity with serious comorbidity, unspecified classification, unspecified obesity type [E66.9] Pressure injury of right buttock, stage 2 (HCC) [L89.312] Patient Active Problem List   Diagnosis Date Noted   Acute on chronic diastolic CHF (congestive heart failure) ([place]) 12/12/2021   PAD (peripheral artery disease) ([place]) 12/12/2021   Irritable bowel syndrome (IBS) 12/12/2021   Amputation of left middle finger 12/12/2021   GERD (gastroesophageal reflux disease) 12/12/2021   Hypertension    Type 2 diabetes mellitus with hyperlipidemia ([place])    Decubitus ulcer of sacral region, stage 2 ([place])    Class 3 obesity ([place])    Bacteremia    PVD (peripheral vascular disease) ([place])    Hepatic abscess    Pressure injury of skin 09/13/2021   S/P  AKA (above knee amputation) bilateral (HCC)    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   Lactic acidosis 09/10/2021   Mixed diabetic hyperlipidemia associated with type 2 diabetes mellitus ([place]) 09/10/2021   Coronary artery disease involving native coronary artery of native heart without angina pectoris 09/10/2021   Elevated troponin  level not due myocardial infarction 09/10/2021   Hypotension 09/10/2021   Dry gangrene (HCC)    Pain 08/03/2021   Ischemic foot pain at rest 08/03/2021   Hypoglycemia 07/10/2021   Volume overload 07/09/2021   Leukocytosis 06/11/2021   Acute gout 06/11/2021   Hyperkalemia 05/21/2021   Hemorrhoids    Hyperlipidemia 05/18/2021   Rectal bleeding 05/17/2021   Morbid obesity with BMI of 60.0-69.9, adult ([place]) 05/17/2021   NSTEMI (non-ST elevated myocardial infarction) ([place]) 05/16/2021   Essential hypertension 09/01/2020   End stage renal disease ([place]) 09/01/2020   Chronic respiratory failure with hypoxia ([place]) 09/01/2020   Hypertensive emergency    Hypertensive urgency 11/28/2019   Uncontrolled type 2 diabetes mellitus with hypoglycemia, with long-term current use of insulin ([place]) 11/28/2019   Anemia due to chronic kidney disease 11/28/2019   PCP:  Monico Blitz, MD Pharmacy:   [place], [place] [address] [place] [address] Phone: [phone] Fax: [phone]  [facility] [address] [place] [address] Phone: [phone] Fax: [phone]  [place], [place] - [address] [place] [address] Phone: [phone] Fax: [phone]     Social Determinants of Health ([place]) Interventions    Readmission Risk Interventions    09/16/2021    4:36 PM 08/28/2021   11:50 AM 07/12/2021   12:38 PM  Readmission Risk Prevention Plan  Transportation Screening Complete Complete Complete  Medication Review (RN Care Manager)  Complete Complete  PCP or Specialist appointment within 3-5 days of discharge  Complete Complete  HRI or Home Care Consult Complete Complete Complete  SW Recovery Care/Counseling Consult Complete Complete Complete  Palliative Care Screening  Not Applicable Not Applicable  Skilled Nursing Facility Complete Not Applicable Complete

## 2022-01-24 NOTE — Progress Notes (Addendum)
[facility] Progress Note   Subjective:    Seen and examined patient at bedside. No acute complaints. Plan for HD this afternoon.  Objective Vitals:   01/23/22 0911 01/23/22 1450 01/23/22 2125 01/24/22 0455  BP:  (!) 103/48 110/79 (!) 100/42  Pulse: 75 72 86 77  Resp: 18 16 18 19   Temp:  98.9 F (37.2 C) 99 F (37.2 C) 98.6 F (37 C)  TempSrc:  Oral Oral Oral  SpO2: 100% 100% 100% 100%  Weight:      Height:       Physical Exam General:WDWN female in NAD Heart:RRR, no MRG Lungs:+CTAB anterolaterally, nml WOB 2L O2 via [place] Abdomen:soft, NTND Extremities:no LE edema, b/l AKA Dialysis Access: [facility]   [facility] Weights   01/15/22 1430 01/15/22 1905  Weight: 116.7 kg 114.1 kg    Intake/Output Summary (Last 24 hours) at 01/24/2022 0910 Last data filed at 01/24/2022 0601 Gross per 24 hour  Intake 917 ml  Output --  Net 917 ml    Additional Objective Labs: Basic Metabolic Panel: Recent Labs  Lab 01/19/22 0310 01/21/22 0953 01/22/22 1414  NA 135 135 137  K 6.2* 4.8 5.0  CL 98 98 97*  CO2 24 23 24   GLUCOSE 91 127* 149*  BUN 44* 29* 42*  CREATININE 8.99* 7.08* 9.68*  CALCIUM 9.9 9.5 9.9  PHOS 7.4* 5.4* 7.7*   Liver Function Tests: Recent Labs  Lab 01/19/22 0310 01/21/22 0953 01/22/22 1414  ALBUMIN 2.7* 2.8* 2.6*   No results for input(s): "LIPASE", "AMYLASE" in the last 168 hours. CBC: Recent Labs  Lab 01/19/22 0310 01/21/22 0953 01/22/22 1414  WBC 9.4 9.1 8.4  NEUTROABS 5.2 6.4  --   HGB 8.1* 8.7* 8.1*  HCT 27.4* 29.1* 26.4*  MCV 99.6 97.7 98.1  PLT 245 252 269   Blood Culture    Component Value Date/Time   SDES BLOOD LEFT ANTECUBITAL 10/16/2021 1534   SPECREQUEST  10/16/2021 1534    BOTTLES DRAWN AEROBIC AND ANAEROBIC Blood Culture results may not be optimal due to an inadequate volume of blood received in culture bottles   CULT  10/16/2021 1534    NO GROWTH 5 DAYS Performed at [facility] [address]  [place], [place] [address]    REPTSTATUS 10/21/2021 FINAL 10/16/2021 1534    Cardiac Enzymes: No results for input(s): "CKTOTAL", "CKMB", "CKMBINDEX", "TROPONINI" in the last 168 hours. CBG: No results for input(s): "GLUCAP" in the last 168 hours. Iron Studies: No results for input(s): "IRON", "TIBC", "TRANSFERRIN", "FERRITIN" in the last 72 hours. Lab Results  Component Value Date   INR 1.3 (H) 10/09/2021   INR 1.3 (H) 09/10/2021   INR 1.3 (H) 07/10/2021   Studies/Results: No results found.  Medications:  ferric gluconate (FERRLECIT) IVPB Stopped (01/23/22 0746)    aspirin EC  81 mg Oral Daily   atorvastatin  10 mg Oral Daily   Chlorhexidine Gluconate Cloth  6 each Topical Q0600   Chlorhexidine Gluconate Cloth  6 each Topical Q0600   Chlorhexidine Gluconate Cloth  6 each Topical Q0600   darbepoetin (ARANESP) injection - DIALYSIS  200 mcg Intravenous Q Wed-HD   docusate sodium  100 mg Oral BID   feeding supplement  1 Container Oral TID BM   gabapentin  100 mg Oral Q1200   gabapentin  200 mg Oral QHS   heparin injection (subcutaneous)  5,000 Units Subcutaneous Q8H   levothyroxine  100 mcg Oral Q0600   loratadine  10 mg Oral Daily   midodrine  10 mg Oral Q M,W,F-HD   pantoprazole  40 mg Oral Daily   polyethylene glycol  17 g Oral Daily   sevelamer carbonate  3.2 g Oral TID WC    Dialysis Orders: MWF - [facility]  4hrs, BFR 400, DFR 600,  AVF, EDW 146kg, 3K/ 2Ca - last hep B labs 8/21 - Heparin 2500 units IV with HD - Hectorol 46mcg IV q HD, held as of 10/30/21   Assessment/Plan: ESRD- HD schedule MWF.  HD today per regular schedule.  Requesting to be scheduled after lunch, will accommodate as able.  Hyperkalemia -  resolved. K elevated w/regular diet. Lokelma given & changed back to renal diet.  Reiterated importance of low K diet. Last K 5.0. Checking labs in AM. HTN/volume- BP stable without antihypertensives.metoprolol DC'd 9/01.  On midodrine 10mg  qHD.  Last   hoyer  wt=118 kg lower at discharge.  Does not appear volume overloaded, continue UF as tolerated.  Severe ischial sacral decubitus wound: Per primary/wound care team.  Limiting her, noted to be slow healing.  Goal to tolerate sitting upright.  see Dr Verlon Au pic.01/17/22 COPD history of hypoxia on [place]  O2 at home(was on 5 L at baseline has been able to taper to 2-3 L [place]) Anemia of ESRD - HGB 8.1. on high-dose ESA q. Monday last saturation 16% 12/19/2021 ferritin 1287. Tsat 19%, order iron load.   Secondary hyperparathyroidism - phos improved with change back to renal diet. 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 Nutrition - on renal diet w/fluid restrictions.  Not happy about it. Disposition: Difficult situation. Prior to admit, patient required 6 staff members and 2 hoyer pads for transfer into HD chair as OP. Could not stay on HD for full tx. 2/2 pain of multiple decubitus ulcers and PTAR unable to lift pt into ambulance for transport. SW has been unable to locate stretcher dialysis location so far. Her wt .is much lower from admit, asked PT about trying to hoyer to recliner for HD -> apparently would need hoyer that wraps around her legs and patient is only able to tolerate <1 hour sitting up at this point./ the main issue is her Rt ischial wound. PT is using a high-quality air cushion from the CIR unit. This remains to be very painful for her to sit. Please see renal navigator note from 12/23/21. Renal navigator has reached out to several out of state facilities to see if they have onsite stretcher dialysis - waiting for return calls.  Tobie Poet, NP [facility] 01/24/2022,9:10 AM  LOS: 84 days   Patient seen and examined, agree with above note with above modifications. No new issues-  continue with routine HD and appropriate titration of HD related medications -  dispo remains an  issue  Corliss Parish, MD 01/24/2022

## 2022-01-25 DIAGNOSIS — I739 Peripheral vascular disease, unspecified: Secondary | ICD-10-CM | POA: Diagnosis not present

## 2022-01-25 DIAGNOSIS — N186 End stage renal disease: Secondary | ICD-10-CM | POA: Diagnosis not present

## 2022-01-25 DIAGNOSIS — J9611 Chronic respiratory failure with hypoxia: Secondary | ICD-10-CM | POA: Diagnosis not present

## 2022-01-25 DIAGNOSIS — L89152 Pressure ulcer of sacral region, stage 2: Secondary | ICD-10-CM | POA: Diagnosis not present

## 2022-01-25 LAB — GLUCOSE, CAPILLARY: Glucose-Capillary: 95 mg/dL (ref 70–99)

## 2022-01-25 NOTE — Progress Notes (Addendum)
  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, hepatic and splenic abscess --required amputation of third digit of left hand and underwent left AKA and had splenic drain as well as CT-guided drainage of liver abscess she had a line holiday on last admission on long-term IV antibiotics until 09/11/2092 diastolic CHF, PVD s/p bilateral AKA, IDDM-2, morbid obesity and  chronic sacral wound presenting with progressive shortness of breath in the setting of missed dialysis and admitted for hypertensive emergency  Per patient, she never missed her dialysis however according to outpatient HD records, it takes at least 4 staff members and [facility] lift for patient to be seated in the dialysis chair.    Symptoms improved with hemodialysis.  Patient underwent R UE venogram on 8/30.  Vascular surgery following for possible RUE HD access.    Patient is now waiting placement where she can get HD on stretcher that has been difficult to find.  Assessment and Plan: * End stage renal disease [facility]) Patient will continue renal replacement therapy per nephrology recommendations.  Needs placement to continue with HD as outpatient.  Very deconditioned.   Anemia of chronic kidney disease, Hgb has been stable.  Continue with EPO and Iron supplementation.   Metabolic bone disease, continue with sevelamer.   Hypertension Patient on midodrine on HD to prevent hypotension. Currently off antihypertensive medications.   Decubitus ulcer of sacral region, stage 2 ([place]) Present on admission. Continue with local care.   Type 2 diabetes mellitus with hyperlipidemia (HCC) Her glucose has been stable and insulin therapy has been discontinued.   Statin therapy with atorvastatin.    Bacteremia Patient has completed antibiotic therapy. She has a abdominal drain in place.  Continue close follow up as outpatient.   PVD (peripheral vascular disease) (HCC) Bilateral AKA.   Continue local wound care.    Class 3 obesity (HCC) Calculated BMI is 65,3         Subjective: Patient with no chest pain or dyspnea   Physical Exam: Vitals:   01/24/22 2245 01/24/22 2300 01/24/22 2351 01/25/22 1656  BP: 96/64 (!) 108/40 (!) 123/43 (!) 103/53  Pulse: 71 62 66 63  Resp: (!) 9 20 18 17   Temp:  98.1 F (36.7 C) 98.1 F (36.7 C) 98.7 F (37.1 C)  TempSrc:   Oral Oral  SpO2: 99% 100% 100% 99%  Weight:  112.2 kg    Height:       Neurology awake and alert ENT with mild pallor Cardiovascular with S1 and S2 present and rhythmic Respiratory with no wheezing Abdomen not distended  Bilateral AKA   Images from her wounds       Data Reviewed:    Family Communication: no family at the bedside   Disposition: Status is: Inpatient Remains inpatient appropriate because: pending placement, patient needs stretcher HD   Planned Discharge Destination: Skilled nursing facility   Author: Tawni Millers, MD 01/25/2022 5:14 PM  For on call review [email or web address].

## 2022-01-25 NOTE — Progress Notes (Addendum)
[facility] Progress Note   Subjective:    Seen and examined patient at bedside. Currently feeling "drained". She reports having some low Bps during yesterday's HD (SBPs in 90s) despite receiving Midodrine. Also reports low blood sugar this morning. Tolerated yesterday's HD with net UF 1.9L. Next HD 10/23. I was in there when nurses were turning and bathin-  she has a small excoriation on her buttock-  no infection- she is worries it will turn into another sore-  did tell me about her BP as well  Objective Vitals:   01/24/22 2230 01/24/22 2245 01/24/22 2300 01/24/22 2351  BP: (!) 109/52 96/64 (!) 108/40 (!) 123/43  Pulse: 73 71 62 66  Resp: 15 (!) 9 20 18   Temp:   98.1 F (36.7 C) 98.1 F (36.7 C)  TempSrc:    Oral  SpO2: 99% 99% 100% 100%  Weight:   112.2 kg   Height:       Physical Exam General:WDWN female in NAD Heart:RRR, no MRG Lungs:+CTAB anterolaterally, nml WOB 3L O2 via [place] Abdomen:soft, NTND Extremities:no LE edema, b/l AKA Dialysis Access: [facility]   Filed Weights   01/15/22 1430 01/15/22 1905 01/24/22 2300  Weight: 116.7 kg 114.1 kg 112.2 kg    Intake/Output Summary (Last 24 hours) at 01/25/2022 0844 Last data filed at 01/25/2022 0200 Gross per 24 hour  Intake 1200 ml  Output 1900 ml  Net -700 ml    Additional Objective Labs: Basic Metabolic Panel: Recent Labs  Lab 01/21/22 0953 01/22/22 1414 01/24/22 1850  NA 135 137 137  K 4.8 5.0 5.0  CL 98 97* 99  CO2 23 24 25   GLUCOSE 127* 149* 105*  BUN 29* 42* 37*  CREATININE 7.08* 9.68* 8.00*  CALCIUM 9.5 9.9 9.3  PHOS 5.4* 7.7* 7.0*   Liver Function Tests: Recent Labs  Lab 01/21/22 0953 01/22/22 1414 01/24/22 1850  ALBUMIN 2.8* 2.6* 2.7*   No results for input(s): "LIPASE", "AMYLASE" in the last 168 hours. CBC: Recent Labs  Lab 01/19/22 0310 01/21/22 0953 01/22/22 1414 01/24/22 1850  WBC 9.4 9.1 8.4 9.2  NEUTROABS 5.2 6.4  --   --   HGB 8.1* 8.7* 8.1* 7.5*  HCT 27.4* 29.1*  26.4* 25.5*  MCV 99.6 97.7 98.1 100.4*  PLT 245 252 269 263   Blood Culture    Component Value Date/Time   SDES BLOOD LEFT ANTECUBITAL 10/16/2021 1534   SPECREQUEST  10/16/2021 1534    BOTTLES DRAWN AEROBIC AND ANAEROBIC Blood Culture results may not be optimal due to an inadequate volume of blood received in culture bottles   CULT  10/16/2021 1534    NO GROWTH 5 DAYS Performed at [facility] [address] [place], [place] [address]    REPTSTATUS 10/21/2021 FINAL 10/16/2021 1534    Cardiac Enzymes: No results for input(s): "CKTOTAL", "CKMB", "CKMBINDEX", "TROPONINI" in the last 168 hours. CBG: Recent Labs  Lab 01/25/22 0248  GLUCAP 95   Iron Studies: No results for input(s): "IRON", "TIBC", "TRANSFERRIN", "FERRITIN" in the last 72 hours. Lab Results  Component Value Date   INR 1.3 (H) 10/09/2021   INR 1.3 (H) 09/10/2021   INR 1.3 (H) 07/10/2021   Studies/Results: No results found.  Medications:  ferric gluconate (FERRLECIT) IVPB 250 mg (01/24/22 2100)    heparin sodium (porcine)       heparin sodium (porcine)       heparin sodium (porcine)       aspirin EC  81 mg Oral Daily   atorvastatin  10 mg Oral Daily   Chlorhexidine Gluconate Cloth  6 each Topical Q0600   darbepoetin (ARANESP) injection - DIALYSIS  200 mcg Intravenous Q Fri-HD   docusate sodium  100 mg Oral BID   feeding supplement  1 Container Oral TID BM   gabapentin  100 mg Oral Q1200   gabapentin  200 mg Oral QHS   heparin injection (subcutaneous)  5,000 Units Subcutaneous Q8H   levothyroxine  100 mcg Oral Q0600   loratadine  10 mg Oral Daily   midodrine  10 mg Oral Q M,W,F-HD   pantoprazole  40 mg Oral Daily   polyethylene glycol  17 g Oral Daily   sevelamer carbonate  3.2 g Oral TID WC    Dialysis Orders: MWF - [facility]  4hrs, BFR 400, DFR 600,  AVF, EDW 146kg, 3K/ 2Ca - last hep B labs 8/21 - Heparin 2500 units IV with HD - Hectorol 55mcg IV q HD, held as of  10/30/21  Assessment/Plan: ESRD- HD schedule MWF.  Next HD 10/23 per regular schedule. Requesting to be scheduled after lunch, will accommodate as able.  Hyperkalemia -  resolved. K elevated w/regular diet. Lokelma given & changed back to renal diet.  Reiterated importance of low K diet. Last K 5.0. HTN/volume- She reports low Bps during HD yesterday (SBPs in 90s). Metoprolol DC'd 9/01. On midodrine 10mg  qHD.  Last  hoyer  wt=118 kg lower at discharge. Remains euvolemic on exam. May need to lower UF at next HD. Will consider giving an extra Midodrine dose mid-treatment if indicated. Severe ischial sacral decubitus wound: Per primary/wound care team.  Limiting her, noted to be slow healing.  Goal to tolerate sitting upright.  see Dr Verlon Au pic.01/17/22 COPD history of hypoxia on [place]  O2 at home(was on 5 L at baseline has been able to taper to 2-3 L [place]) Anemia of ESRD - HGB 7.5. on high-dose ESA q. Monday last saturation 16% 12/19/2021 ferritin 1287. Tsat 19%, order iron load.   Secondary hyperparathyroidism - phos improved with change back to renal diet. 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 Nutrition - on renal diet w/fluid restrictions.  Not happy about it. Disposition: Difficult situation. Prior to admit, patient required 6 staff members and 2 hoyer pads for transfer into HD chair as OP. Could not stay on HD for full tx. 2/2 pain of multiple decubitus ulcers and PTAR unable to lift pt into ambulance for transport. SW has been unable to locate stretcher dialysis location so far. Her wt .is much lower from admit, asked PT about trying to hoyer to recliner for HD -> apparently would need hoyer that wraps around her legs and patient is only able to tolerate <1 hour sitting up at this point./ the main issue is her Rt ischial wound. PT is using a high-quality air cushion from the CIR unit. This remains to be  very painful for her to sit. Please see renal navigator note from 12/23/21. Renal navigator has reached out to several out of state facilities to see if they have onsite stretcher dialysis - waiting for return calls.   Tobie Poet, NP [facility] 01/25/2022,8:44 AM  LOS: 85 days   Patient seen and examined, agree with above note with above modifications. I agree that we may ned to start backing off on UF-  she has lost a lot of weight but her weight distribution makes lifting with hoyer difficult-  she is like a ball.  Cont to have anemia even with max ESA  Corliss Parish, MD  Will not be physically seen on Sunday, will revisit on Monday and arrange HD for Monday- call with any concerns tomorrow 01/25/2022

## 2022-01-25 NOTE — Progress Notes (Signed)
   01/24/22 2300  Vitals  Temp 98.1 F (36.7 C)  BP (!) 108/40  Pulse Rate 62  ECG Heart Rate 73  Resp 20  Post Treatment  Dialyzer Clearance Heavily streaked  Duration of HD Treatment -hour(s) 4 hour(s)  Liters Processed 81.8  Fluid Removed 1900 mL  Tolerated HD Treatment Yes  Post-Hemodialysis Comments She said she cool and claiming but refused BS to be taken.   TX fin w/ complaints of cool and clammy, but refusing to take blood surgger test.

## 2022-01-25 NOTE — Plan of Care (Signed)
  Problem: Skin Integrity: Goal: Risk for impaired skin integrity will decrease Outcome: Progressing   Problem: Education: Goal: Knowledge of General Education information will improve Description: Including pain rating scale, medication(s)/side effects and non-pharmacologic comfort measures Outcome: Progressing   Problem: Health Behavior/Discharge Planning: Goal: Ability to manage health-related needs will improve Outcome: Progressing   Problem: Clinical Measurements: Goal: Ability to maintain clinical measurements within normal limits will improve Outcome: Progressing Goal: Will remain free from infection Outcome: Progressing Goal: Diagnostic test results will improve Outcome: Progressing Goal: Respiratory complications will improve Outcome: Progressing Goal: Cardiovascular complication will be avoided Outcome: Progressing   Problem: Activity: Goal: Risk for activity intolerance will decrease Outcome: Progressing   Problem: Nutrition: Goal: Adequate nutrition will be maintained Outcome: Progressing   Problem: Coping: Goal: Level of anxiety will decrease Outcome: Progressing   Problem: Elimination: Goal: Will not experience complications related to bowel motility Outcome: Progressing Goal: Will not experience complications related to urinary retention Outcome: Progressing   Problem: Pain Managment: Goal: General experience of comfort will improve Outcome: Progressing

## 2022-01-26 DIAGNOSIS — E1169 Type 2 diabetes mellitus with other specified complication: Secondary | ICD-10-CM | POA: Diagnosis not present

## 2022-01-26 DIAGNOSIS — L89152 Pressure ulcer of sacral region, stage 2: Secondary | ICD-10-CM | POA: Diagnosis not present

## 2022-01-26 DIAGNOSIS — N186 End stage renal disease: Secondary | ICD-10-CM | POA: Diagnosis not present

## 2022-01-26 DIAGNOSIS — R7881 Bacteremia: Secondary | ICD-10-CM | POA: Diagnosis not present

## 2022-01-26 MED ORDER — ALTEPLASE 2 MG IJ SOLR: 2.0000 mg | Freq: Once | INTRAMUSCULAR | Status: DC | PRN

## 2022-01-26 MED ORDER — HEPARIN SODIUM (PORCINE) 1000 UNIT/ML DIALYSIS
1000.0000 [IU] | INTRAMUSCULAR | Status: DC | PRN
  Administered 2022-01-27: 3000 [IU] via INTRAVENOUS_CENTRAL
  Administered 2022-01-27: 1000 [IU] via INTRAVENOUS_CENTRAL
  Filled 2022-01-26 (×5): qty 1

## 2022-01-26 MED ORDER — LIDOCAINE HCL (PF) 1 % IJ SOLN: 5.0000 mL | INTRAMUSCULAR | Status: DC | PRN

## 2022-01-26 MED ORDER — MIDODRINE HCL 5 MG PO TABS: 10.0000 mg | ORAL_TABLET | Freq: Once | ORAL | Status: DC

## 2022-01-26 MED ORDER — PENTAFLUOROPROP-TETRAFLUOROETH EX AERO: 1.0000 | INHALATION_SPRAY | CUTANEOUS | Status: DC | PRN

## 2022-01-26 MED ORDER — LIDOCAINE-PRILOCAINE 2.5-2.5 % EX CREA: 1.0000 | TOPICAL_CREAM | CUTANEOUS | Status: DC | PRN

## 2022-01-26 NOTE — Plan of Care (Signed)
  Problem: Skin Integrity: Goal: Risk for impaired skin integrity will decrease Outcome: Progressing   Problem: Education: Goal: Knowledge of General Education information will improve Description: Including pain rating scale, medication(s)/side effects and non-pharmacologic comfort measures Outcome: Progressing   Problem: Health Behavior/Discharge Planning: Goal: Ability to manage health-related needs will improve Outcome: Progressing   Problem: Clinical Measurements: Goal: Ability to maintain clinical measurements within normal limits will improve Outcome: Progressing Goal: Will remain free from infection Outcome: Progressing Goal: Diagnostic test results will improve Outcome: Progressing Goal: Respiratory complications will improve Outcome: Progressing Goal: Cardiovascular complication will be avoided Outcome: Progressing   Problem: Activity: Goal: Risk for activity intolerance will decrease Outcome: Progressing   Problem: Nutrition: Goal: Adequate nutrition will be maintained Outcome: Progressing   Problem: Coping: Goal: Level of anxiety will decrease Outcome: Progressing   Problem: Elimination: Goal: Will not experience complications related to bowel motility Outcome: Progressing Goal: Will not experience complications related to urinary retention Outcome: Progressing   Problem: Elimination: Goal: Will not experience complications related to bowel motility Outcome: Progressing Goal: Will not experience complications related to urinary retention Outcome: Progressing   Problem: Pain Managment: Goal: General experience of comfort will improve Outcome: Progressing   Problem: Safety: Goal: Ability to remain free from injury will improve Outcome: Progressing   Problem: Skin Integrity: Goal: Risk for impaired skin integrity will decrease Outcome: Progressing

## 2022-01-26 NOTE — Progress Notes (Signed)
  Progress Note   Patient: Courtney Gray [id number]:[id number] DOB: 22-Apr-1970 DOA: 11/01/2021     86 DOS: the patient was seen and examined on 01/26/2022   Brief hospital course: 51 year old F ESRD on HD TTS,  recent lengthy hospitalization for Bacteroides Thetaiotaomicron bacteremia, hepatic and splenic abscess --required amputation of third digit of left hand and underwent left AKA and had splenic drain as well as CT-guided drainage of liver abscess she had a line holiday on last admission on long-term IV antibiotics until 05/16/9240 diastolic CHF, PVD s/p bilateral AKA, IDDM-2, morbid obesity and  chronic sacral wound presenting with progressive shortness of breath in the setting of missed dialysis and admitted for hypertensive emergency  Per patient, she never missed her dialysis however according to outpatient HD records, it takes at least 4 staff members and [facility] lift for patient to be seated in the dialysis chair.    Symptoms improved with hemodialysis.  Patient underwent R UE venogram on 8/30.  Vascular surgery following for possible RUE HD access.    Patient is now waiting placement where she can get HD on stretcher that has been difficult to find.  Assessment and Plan: * End stage renal disease [facility]) Patient will continue renal replacement therapy per nephrology recommendations.  Needs placement to continue with HD as outpatient.  Very deconditioned.   Anemia of chronic kidney disease, Hgb has been stable.  Continue with EPO and Iron supplementation.   Metabolic bone disease, continue with sevelamer.   Hypertension Patient on midodrine on HD to prevent hypotension. Currently off antihypertensive medications.   Decubitus ulcer of sacral region, stage 2 ([place]) Present on admission. Continue with local care.   Type 2 diabetes mellitus with hyperlipidemia (HCC) Her glucose has been stable and insulin therapy has been discontinued.   Statin therapy with atorvastatin.    Bacteremia Patient has completed antibiotic therapy. She has a abdominal drain in place.  Continue close follow up as outpatient.   PVD (peripheral vascular disease) (HCC) Bilateral AKA.   Continue local wound care.    Class 3 obesity (HCC) Calculated BMI is 65,3         Subjective: Patient with no chest pain or dyspnea, tolerating po well   Physical Exam: Vitals:   01/26/22 0431 01/26/22 0952 01/26/22 1643 01/26/22 2106  BP: (!) 104/92 (!) 106/41 (!) 106/38 (!) 119/28  Pulse: 73 63 66 96  Resp: 18 16 18 18   Temp: 98.8 F (37.1 C) 98.7 F (37.1 C) 98.1 F (36.7 C) 98.7 F (37.1 C)  TempSrc: Oral Oral Oral Oral  SpO2: 100% 100% 100% 99%  Weight:      Height:       Neurology awake and alert ENT with no pallor Cardiovascular with S1 and S2 present and rhythmic Respiratory with no wheezing Abdomen with no distention  No edema  Bilateral AKA  Data Reviewed:    Family Communication: no family at the bedside   Disposition: Status is: Inpatient Remains inpatient appropriate because: pending placement   Planned Discharge Destination: Skilled nursing facility    Author: Tawni Millers, MD 01/26/2022 11:11 PM  For on call review [email or web address].

## 2022-01-27 DIAGNOSIS — N186 End stage renal disease: Secondary | ICD-10-CM | POA: Diagnosis not present

## 2022-01-27 DIAGNOSIS — R7881 Bacteremia: Secondary | ICD-10-CM | POA: Diagnosis not present

## 2022-01-27 DIAGNOSIS — I10 Essential (primary) hypertension: Secondary | ICD-10-CM | POA: Diagnosis not present

## 2022-01-27 DIAGNOSIS — E1169 Type 2 diabetes mellitus with other specified complication: Secondary | ICD-10-CM | POA: Diagnosis not present

## 2022-01-27 LAB — CBC
HCT: 26.3 % — ABNORMAL LOW (ref 36.0–46.0)
Hemoglobin: 7.9 g/dL — ABNORMAL LOW (ref 12.0–15.0)
MCH: 29.7 pg (ref 26.0–34.0)
MCHC: 30 g/dL (ref 30.0–36.0)
MCV: 98.9 fL (ref 80.0–100.0)
Platelets: 294 10*3/uL (ref 150–400)
RBC: 2.66 MIL/uL — ABNORMAL LOW (ref 3.87–5.11)
RDW: 14.6 % (ref 11.5–15.5)
WBC: 10.1 10*3/uL (ref 4.0–10.5)
nRBC: 0 % (ref 0.0–0.2)

## 2022-01-27 LAB — RENAL FUNCTION PANEL
Albumin: 2.9 g/dL — ABNORMAL LOW (ref 3.5–5.0)
Anion gap: 16 — ABNORMAL HIGH (ref 5–15)
BUN: 46 mg/dL — ABNORMAL HIGH (ref 6–20)
CO2: 25 mmol/L (ref 22–32)
Calcium: 10 mg/dL (ref 8.9–10.3)
Chloride: 93 mmol/L — ABNORMAL LOW (ref 98–111)
Creatinine, Ser: 9.44 mg/dL — ABNORMAL HIGH (ref 0.44–1.00)
GFR, Estimated: 5 mL/min — ABNORMAL LOW (ref >60)
Glucose, Bld: 111 mg/dL — ABNORMAL HIGH (ref 70–99)
Phosphorus: 7.3 mg/dL — ABNORMAL HIGH (ref 2.5–4.6)
Potassium: 4.7 mmol/L (ref 3.5–5.1)
Sodium: 134 mmol/L — ABNORMAL LOW (ref 135–145)

## 2022-01-27 MED ORDER — ALBUMIN HUMAN 25 % IV SOLN: 25.0000 g | Freq: Once | INTRAVENOUS | Status: DC

## 2022-01-27 MED ORDER — OXYCODONE HCL 5 MG PO TABS
5.0000 mg | ORAL_TABLET | ORAL | Status: DC | PRN
  Administered 2022-01-27 – 2022-02-09 (×8): 5 mg via ORAL
  Filled 2022-01-27 (×8): qty 1

## 2022-01-27 MED ORDER — HEPARIN SODIUM (PORCINE) 1000 UNIT/ML IJ SOLN
INTRAMUSCULAR | Status: AC
  Filled 2022-01-27: qty 5

## 2022-01-27 NOTE — Progress Notes (Signed)
Dr Marval Regal paged and informed pt with initial soft sbps dropped to symptomatic 60s, treated with 100cc x 2 NS bolus and supine positioning. Will give midodrine 10 mg now per md and albumin 25 mg x1 if needed

## 2022-01-27 NOTE — Plan of Care (Signed)
  Problem: Skin Integrity: Goal: Risk for impaired skin integrity will decrease Outcome: Progressing   

## 2022-01-27 NOTE — Assessment & Plan Note (Signed)
Volume regulation with ultrafiltration in hemodialysis Continue midodrine for blood pressure support.

## 2022-01-27 NOTE — Progress Notes (Signed)
Progress Note   Patient: Courtney Gray KZS:[id number] DOB: 1970/08/08 DOA: 11/01/2021     87 DOS: the patient was seen and examined on 01/27/2022   Brief hospital course: 51 year old F ESRD on HD TTS,  recent lengthy hospitalization for Bacteroides Thetaiotaomicron bacteremia, hepatic and splenic abscess --required amputation of third digit of left hand and underwent left AKA and had splenic drain as well as CT-guided drainage of liver abscess she had a line holiday on last admission on long-term IV antibiotics until 10/07/2200 diastolic CHF, PVD s/p bilateral AKA, IDDM-2, morbid obesity and  chronic sacral wound presenting with progressive shortness of breath in the setting of missed dialysis and admitted for hypertensive emergency  Per patient, she never missed her dialysis however according to outpatient HD records, it takes at least 4 staff members and [facility] lift for patient to be seated in the dialysis chair.    Symptoms improved with hemodialysis.  Patient underwent R UE venogram on 8/30.  Vascular surgery following for possible RUE HD access.    Patient is now waiting placement where she can get HD on stretcher that has been difficult to find.  Assessment and Plan: * End stage renal disease [facility]) Patient will continue renal replacement therapy per nephrology recommendations.  Needs placement to continue with HD as outpatient.  Very deconditioned, large sacral decubitus wound, needs stretcher hemodialysis.   Anemia of chronic kidney disease, Hgb has been stable.  Continue with EPO and Iron supplementation.   Metabolic bone disease, continue with sevelamer.   Hyperkalemia, hypomagnesemia, hypokalemia, hyponatremia, hypophosphatemia has bee corrected with renal replacement therapy.   Hypertension Patient on midodrine on HD to prevent hypotension. Currently off antihypertensive medications.   Decubitus ulcer of sacral region, stage 3 (HCC) Deep wound at the sacrum, patient  not able to seat.  Not able to do chair HD due to wound and pain.  Continue with oral oxycodone, discontinue hydromorphone  Continue with local wound care.     Type 2 diabetes mellitus with hyperlipidemia (HCC) Her glucose has been stable and insulin therapy has been discontinued.   Statin therapy with atorvastatin.   Bacteremia Patient has completed antibiotic therapy. Bacteroides, splenic abscess.   PVD (peripheral vascular disease) (HCC) Bilateral AKA.   Continue local wound care.    Class 3 obesity (HCC) Calculated BMI is 65,3   Acute on chronic diastolic CHF (congestive heart failure) (HCC) Volume regulation with ultrafiltration in hemodialysis Continue midodrine for blood pressure support.         Subjective: patient has been stable, continue very weak and deconditioned   Physical Exam: Vitals:   01/26/22 1643 01/26/22 2106 01/27/22 0503 01/27/22 0949  BP: (!) 106/38 (!) 119/28 (!) 123/48 (!) 122/56  Pulse: 66 96 (!) 44 67  Resp: 18 18 18 16   Temp: 98.1 F (36.7 C) 98.7 F (37.1 C) 98.4 F (36.9 C) 98.2 F (36.8 C)  TempSrc: Oral Oral Oral Oral  SpO2: 100% 99% 100% 100%  Weight:      Height:       Neurology awake and alert ENT with mild pallor Cardiovascular with S1 and S2 present and rhythmic with no gallops Respiratory with no rales  Abdomen with no distention, protuberant Bilateral AKA  Data Reviewed:    Family Communication: no family at the bedside   Disposition: Status is: Inpatient Remains inpatient appropriate because: pending placement   Planned Discharge Destination: Skilled nursing facility    Author: Tawni Millers, MD 01/27/2022 2:59 PM  For  on call review [email or web address].

## 2022-01-27 NOTE — Progress Notes (Addendum)
[facility] Progress Note   Subjective:   Patient seen and examined at bedside.  Reports HA and lightheadedness post HD on Friday.  Denies CP, SOB, abdominal pain and n/v/d.    Objective Vitals:   01/26/22 1643 01/26/22 2106 01/27/22 0503 01/27/22 0949  BP: (!) 106/38 (!) 119/28 (!) 123/48 (!) 122/56  Pulse: 66 96 (!) 44 67  Resp: 18 18 18 16   Temp: 98.1 F (36.7 C) 98.7 F (37.1 C) 98.4 F (36.9 C) 98.2 F (36.8 C)  TempSrc: Oral Oral Oral Oral  SpO2: 100% 99% 100% 100%  Weight:      Height:       Physical Exam General:WDWN, obese female in NAD Heart:RRR, no mrg Lungs:CTAB, nml WOB on 3L O2 via [place] Abdomen:soft, NTND Extremities:no LE edema, b/l AKA Dialysis Access: [facility]   Filed Weights   01/15/22 1430 01/15/22 1905 01/24/22 2300  Weight: 116.7 kg 114.1 kg 112.2 kg    Intake/Output Summary (Last 24 hours) at 01/27/2022 1245 Last data filed at 01/27/2022 0503 Gross per 24 hour  Intake 660 ml  Output 0 ml  Net 660 ml    Additional Objective Labs: Basic Metabolic Panel: Recent Labs  Lab 01/21/22 0953 01/22/22 1414 01/24/22 1850  NA 135 137 137  K 4.8 5.0 5.0  CL 98 97* 99  CO2 23 24 25   GLUCOSE 127* 149* 105*  BUN 29* 42* 37*  CREATININE 7.08* 9.68* 8.00*  CALCIUM 9.5 9.9 9.3  PHOS 5.4* 7.7* 7.0*   Liver Function Tests: Recent Labs  Lab 01/21/22 0953 01/22/22 1414 01/24/22 1850  ALBUMIN 2.8* 2.6* 2.7*   CBC: Recent Labs  Lab 01/21/22 0953 01/22/22 1414 01/24/22 1850  WBC 9.1 8.4 9.2  NEUTROABS 6.4  --   --   HGB 8.7* 8.1* 7.5*  HCT 29.1* 26.4* 25.5*  MCV 97.7 98.1 100.4*  PLT 252 269 263   CBG: Recent Labs  Lab 01/25/22 0248  GLUCAP 95    Medications:  ferric gluconate (FERRLECIT) IVPB 250 mg (01/24/22 2100)    aspirin EC  81 mg Oral Daily   atorvastatin  10 mg Oral Daily   darbepoetin (ARANESP) injection - DIALYSIS  200 mcg Intravenous Q Fri-HD   docusate sodium  100 mg Oral BID   feeding supplement  1  Container Oral TID BM   gabapentin  100 mg Oral Q1200   gabapentin  200 mg Oral QHS   heparin injection (subcutaneous)  5,000 Units Subcutaneous Q8H   levothyroxine  100 mcg Oral Q0600   loratadine  10 mg Oral Daily   midodrine  10 mg Oral Q M,W,F-HD   midodrine  10 mg Oral Once in dialysis   pantoprazole  40 mg Oral Daily   polyethylene glycol  17 g Oral Daily   sevelamer carbonate  3.2 g Oral TID WC    Dialysis Orders: MWF - [place]  4hrs, BFR 400, DFR 600,  AVF, EDW 146kg, 3K/ 2Ca - last hep B labs 8/21 - Heparin 2500 units IV with HD - Hectorol 31mcg IV q HD, held as of 10/30/21   Assessment/Plan: ESRD- HD schedule MWF.  Next HD 10/23 per regular schedule. Requesting to be scheduled after lunch, will accommodate as able.  Hyperkalemia -  resolved. K elevated w/regular diet. Lokelma given & changed back to heart healthy diet, then changed again back to Regular diet.  Last K 5.0.  If K becomes elevated again will require renal diet.  HTN/volume- She reports low Bps during last HD w/HA(SBPs in 90s). Metoprolol DC'd 9/01. On midodrine 10mg  qHD.  Last  hoyer  wt=118 kg lower at discharge. Remains euvolemic on exam. May need to lower UF at next HD. Will consider giving an extra Midodrine dose mid-treatment if indicated w/parameters to keep SBP>100. Severe ischial sacral decubitus wound: Per primary/wound care team.  Limiting her, noted to be slow healing.  Goal to tolerate sitting upright.  see Dr Verlon Au pic.01/17/22 COPD history of hypoxia on [place] O2 at home(was on 5 L at baseline has been able to taper to 2-3 L [place]) Anemia of ESRD - last HGB 7.5. on high-dose ESA q. Monday last saturation 16% 12/19/2021 ferritin 1287. Tsat 19%, order iron load.   Secondary hyperparathyroidism - phos back up now on heart healthy diet. On Renvela 3.2gm with meals, increase dose.  Calcium in goal. Not on VDRA, last pth 25 in August - recheck. 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 Nutrition - diet continues to be changed from renal.  Currently on heart healthy diet.  Phos now elevated requiring ^binders. If K^, will need to change back to Renal diet and stay on renal diet.  Disposition: Difficult situation. Prior to admit, patient required 6 staff members and 2 hoyer pads for transfer into HD chair as OP. Could not stay on HD for full tx. 2/2 pain of multiple decubitus ulcers and PTAR unable to lift pt into ambulance for transport. SW has been unable to locate stretcher dialysis location so far. Her wt .is much lower from admit, asked PT about trying to hoyer to recliner for HD -> apparently would need hoyer that wraps around her legs and patient is only able to tolerate <1 hour sitting up at this point./ the main issue is her Rt ischial wound. PT is using a high-quality air cushion from the CIR unit. This remains to be very painful for her to sit. Please see renal navigator note from 12/23/21. Renal navigator has reached out to several out of state facilities to see if they have onsite stretcher dialysis - waiting for return calls.  Jen Mow, PA-C [facility] 01/27/2022,12:45 PM  LOS: 87 days    Seen and examined independently.  Agree with note and exam as documented above by physician extender and as noted here.  General adult female in bed in no acute distress, obese habitus HEENT normocephalic atraumatic extraocular movements intact sclera anicteric Neck supple trachea midline Lungs clear to auscultation bilaterally normal work of breathing at rest on 2 liters Heart S1S2 no rub Abdomen soft nontender nondistended Extremities bilateral AKA's; no edema of residual limbs appreciated  Psych normal mood and affect'   ESRD - reduce UF with HD.  HD today and per MWF schedule.  I have ordered labs  Sacral decubitus wound - per primary team.  Prevents her from being able to tolerate in-center HD.   COPD - on 2 liters oxygen here and  previously much more   Disposition - appreciate SW assistance. SW has been unable to locate stretcher dialysis location so far.  She is not able to be discharged without an accepting outpatient HD unit.   Claudia Desanctis, MD 01/27/2022  2:07 PM

## 2022-01-28 DIAGNOSIS — R7881 Bacteremia: Secondary | ICD-10-CM | POA: Diagnosis not present

## 2022-01-28 DIAGNOSIS — N186 End stage renal disease: Secondary | ICD-10-CM | POA: Diagnosis not present

## 2022-01-28 DIAGNOSIS — E1169 Type 2 diabetes mellitus with other specified complication: Secondary | ICD-10-CM | POA: Diagnosis not present

## 2022-01-28 DIAGNOSIS — I10 Essential (primary) hypertension: Secondary | ICD-10-CM | POA: Diagnosis not present

## 2022-01-28 MED ORDER — SEVELAMER CARBONATE 2.4 G PO PACK
4.8000 g | PACK | Freq: Three times a day (TID) | ORAL | Status: DC
  Administered 2022-01-29 – 2022-03-21 (×87): 4.8 g via ORAL
  Filled 2022-01-28: qty 6
  Filled 2022-01-28: qty 2
  Filled 2022-01-28 (×15): qty 6
  Filled 2022-01-28: qty 2
  Filled 2022-01-28 (×6): qty 6
  Filled 2022-01-28 (×2): qty 2
  Filled 2022-01-28 (×10): qty 6
  Filled 2022-01-28: qty 2
  Filled 2022-01-28: qty 6
  Filled 2022-01-28: qty 2
  Filled 2022-01-28: qty 6
  Filled 2022-01-28: qty 2
  Filled 2022-01-28 (×2): qty 6
  Filled 2022-01-28: qty 2
  Filled 2022-01-28: qty 6
  Filled 2022-01-28: qty 2
  Filled 2022-01-28: qty 6
  Filled 2022-01-28: qty 2
  Filled 2022-01-28 (×4): qty 6
  Filled 2022-01-28: qty 2
  Filled 2022-01-28: qty 6
  Filled 2022-01-28: qty 2
  Filled 2022-01-28 (×2): qty 6
  Filled 2022-01-28: qty 2
  Filled 2022-01-28: qty 6
  Filled 2022-01-28: qty 2
  Filled 2022-01-28: qty 6
  Filled 2022-01-28 (×2): qty 2
  Filled 2022-01-28: qty 6
  Filled 2022-01-28: qty 2
  Filled 2022-01-28 (×6): qty 6
  Filled 2022-01-28: qty 2
  Filled 2022-01-28 (×3): qty 6
  Filled 2022-01-28: qty 2
  Filled 2022-01-28: qty 6
  Filled 2022-01-28 (×2): qty 2
  Filled 2022-01-28: qty 6
  Filled 2022-01-28: qty 2
  Filled 2022-01-28: qty 6
  Filled 2022-01-28 (×2): qty 2
  Filled 2022-01-28 (×5): qty 6
  Filled 2022-01-28: qty 2
  Filled 2022-01-28: qty 6
  Filled 2022-01-28: qty 2
  Filled 2022-01-28 (×6): qty 6
  Filled 2022-01-28: qty 2
  Filled 2022-01-28: qty 6
  Filled 2022-01-28 (×2): qty 2
  Filled 2022-01-28 (×3): qty 6
  Filled 2022-01-28: qty 2
  Filled 2022-01-28: qty 6

## 2022-01-28 NOTE — Plan of Care (Signed)
  Problem: Skin Integrity: Goal: Risk for impaired skin integrity will decrease Outcome: Completed/Met

## 2022-01-28 NOTE — Progress Notes (Addendum)
[facility] Progress Note   Subjective:   Patient seen and examined at bedside.  Tired and uncomfortable.  No other specific complaints.  Dialysis completed overnight.   Objective Vitals:   01/27/22 2130 01/27/22 2200 01/27/22 2215 01/28/22 0600  BP: (!) 144/56 (!) 137/55 137/66 130/61  Pulse: 63 61 65 72  Resp:   20 18  Temp:   98.5 F (36.9 C) 98 F (36.7 C)  TempSrc:   Oral   SpO2:   98% 98%  Weight:      Height:       Physical Exam General:WDWN obese female in NAD Heart:RRR, no mrg Lungs:CTAB, nml WOB on 2L O2 via [place] Abdomen:soft, NTND Extremities:no LE edema, b/l AKA Dialysis Access: [facility]   Filed Weights   01/15/22 1430 01/15/22 1905 01/24/22 2300  Weight: 116.7 kg 114.1 kg 112.2 kg    Intake/Output Summary (Last 24 hours) at 01/28/2022 0817 Last data filed at 01/28/2022 0200 Gross per 24 hour  Intake 480 ml  Output -299 ml  Net 779 ml    Additional Objective Labs: Basic Metabolic Panel: Recent Labs  Lab 01/22/22 1414 01/24/22 1850 01/27/22 1830  NA 137 137 134*  K 5.0 5.0 4.7  CL 97* 99 93*  CO2 24 25 25   GLUCOSE 149* 105* 111*  BUN 42* 37* 46*  CREATININE 9.68* 8.00* 9.44*  CALCIUM 9.9 9.3 10.0  PHOS 7.7* 7.0* 7.3*   Liver Function Tests: Recent Labs  Lab 01/22/22 1414 01/24/22 1850 01/27/22 1830  ALBUMIN 2.6* 2.7* 2.9*    CBC: Recent Labs  Lab 01/21/22 0953 01/22/22 1414 01/24/22 1850 01/27/22 1810  WBC 9.1 8.4 9.2 10.1  NEUTROABS 6.4  --   --   --   HGB 8.7* 8.1* 7.5* 7.9*  HCT 29.1* 26.4* 25.5* 26.3*  MCV 97.7 98.1 100.4* 98.9  PLT 252 269 263 294   CBG: Recent Labs  Lab 01/25/22 0248  GLUCAP 95    Medications:  albumin human     ferric gluconate (FERRLECIT) IVPB Stopped (01/27/22 2100)    aspirin EC  81 mg Oral Daily   atorvastatin  10 mg Oral Daily   darbepoetin (ARANESP) injection - DIALYSIS  200 mcg Intravenous Q Fri-HD   docusate sodium  100 mg Oral BID   feeding supplement  1 Container Oral  TID BM   gabapentin  100 mg Oral Q1200   gabapentin  200 mg Oral QHS   heparin injection (subcutaneous)  5,000 Units Subcutaneous Q8H   levothyroxine  100 mcg Oral Q0600   midodrine  10 mg Oral Q M,W,F-HD   pantoprazole  40 mg Oral Daily   polyethylene glycol  17 g Oral Daily   sevelamer carbonate  4.8 g Oral TID WC    Dialysis Orders: MWF - [facility]  4hrs, BFR 400, DFR 600,  AVF, EDW 146kg, 3K/ 2Ca - last hep B labs 8/21 - Heparin 2500 units IV with HD - Hectorol 57mcg IV q HD, held as of 10/30/21   Assessment/Plan: ESRD- HD schedule MWF.  Next HD 10/25 per regular schedule. Requesting to be scheduled after lunch, will accommodate as able.  Hyperkalemia -  resolved. K elevated w/regular diet. Lokelma given & changed back to heart healthy diet, then changed again back to Regular diet.  Last K 4.7.  If K becomes elevated again will require renal diet.   HTN/volume- Metoprolol DC'd 9/01. On midodrine 10mg  qHD, with 10mg  prn mid HD w/parameters to  keep SBP>100 d/t becoming symptomatic when it drops lower.  Significant weight loss during admission. Last  hoyer  wt=118 kg lower at discharge. Remains euvolemic on exam.  Severe ischial sacral decubitus wound: Per primary/wound care team.  Limiting her, noted to be slow healing.  Goal to tolerate sitting upright.  see Dr Verlon Au pic.01/17/22 COPD history of hypoxia on [place] O2 at home(was on 5 L at baseline has been able to taper to 2-3 L [place]) Anemia of ESRD - Hgb^7.9. on high-dose ESA q. Monday last saturation 19% with ferritin 935. Getting iron load.  Secondary hyperparathyroidism - phos back up now on heart healthy diet. Increased Renvela to 4.8g AC TID on 10/24.  Calcium in goal. Not on VDRA, last pth 42 in August - recheck. 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 Nutrition - diet continues to be changed from renal.  Currently on heart healthy diet.  Phos now elevated requiring ^binders. If K^, will need to  change back to Renal diet and stay on renal diet.  Disposition: Difficult situation. Prior to admit, patient required 6 staff members and 2 hoyer pads for transfer into HD chair as OP. Could not stay on HD for full tx. 2/2 pain of multiple decubitus ulcers and PTAR unable to lift pt into ambulance for transport. SW has been unable to locate stretcher dialysis location so far. Her wt .is much lower from admit, asked PT about trying to hoyer to recliner for HD -> apparently would need hoyer that wraps around her legs and patient is only able to tolerate <1 hour sitting up at this point./ the main issue is her Rt ischial wound. PT is using a high-quality air cushion from the CIR unit. This remains to be very painful for her to sit. Please see renal navigator note from 12/23/21. Renal navigator has reached out to several out of state facilities to see if they have onsite stretcher dialysis - waiting for return calls.  Jen Mow, PA-C [facility] 01/28/2022,8:17 AM  LOS: 88 days   Seen and examined independently.  Agree with note and exam as documented above by physician extender and as noted here.  She states HD went ok.   General adult female in bed in no acute distress, obese habitus HEENT normocephalic atraumatic extraocular movements intact sclera anicteric Neck supple trachea midline Lungs clear to auscultation bilaterally normal work of breathing at rest on 2 liters Heart S1S2 no rub Abdomen soft nontender nondistended Extremities bilateral AKA's; no edema of residual limbs appreciated  Psych normal mood and affect Access tunn catheter  ESRD - we have reduced UF with HD.  HD per MWF schedule.  renal panel in am   Sacral decubitus wound - per primary team.  Prevents her from being able to tolerate in-center HD.    COPD - on 2 liters oxygen here and previously much more    Disposition - appreciate SW assistance. SW has been unable to locate stretcher dialysis location  so far.  She is not able to be discharged without an accepting outpatient HD unit.   Claudia Desanctis, MD 01/28/2022 11:14 AM

## 2022-01-28 NOTE — Progress Notes (Signed)
   01/27/22 2215  Vitals  Temp 98.5 F (36.9 C)  Temp Source Oral  BP 137/66  MAP (mmHg) 83  BP Location Left Arm  BP Method Automatic  Patient Position (if appropriate) Lying  Pulse Rate 65  Pulse Rate Source Monitor  ECG Heart Rate 67  Resp 20  Post Treatment  Dialyzer Clearance Heavily streaked  Duration of HD Treatment -hour(s) 4 hour(s)  Liters Processed 96  Fluid Removed -300 mL  Tolerated HD Treatment Yes  Post-Hemodialysis Comments TX fin. Did get iron and stopped flow when bp was bellow 100 stp then restarted agian. she didn't get those low bp head aches she c/o. bed not holding inflate pattern / New air flow matt. bed sent to room.   TX fin w/o difficulty.

## 2022-01-28 NOTE — Progress Notes (Signed)
Physical Therapy Treatment Patient Details Name: Courtney Gray MRN: [id number] DOB: [birth year] Today's Date: 01/28/2022   History of Present Illness Pt adm 7/28 with acute pulmonary edema; large sacral ulcer limiting sitting tolerance which makes SNF placement difficult for HD. 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    PT Comments    Continue working on functional mobility and activity tolerance; session focused on building rapport with pt, transfering to [facility] via maxisky, and bed mobility ; pt performed bolstered bridges in bed; discussed phantom pain and sensations; pt on RA upon arrival and throughout session; pt reported no pain during session, slight discomfort with sitting on R ischium; ended session with pt in [facility] eating lunch due to onset headache due to lack of food; pt progressing towards goals; d/c to SNF remains appropriate; PT will continue to follow.     Recommendations for follow up therapy are one component of a multi-disciplinary discharge planning process, led by the attending physician.  Recommendations may be updated based on patient status, additional functional criteria and insurance authorization.  Follow Up Recommendations  Skilled nursing-short term rehab (<3 hours/day) Can patient physically be transported by private vehicle: No   Assistance Recommended at Discharge Intermittent Supervision/Assistance  Patient can return home with the following Two people to help with walking and/or transfers;Two people to help with bathing/dressing/bathroom;Assist for transportation;Assistance with cooking/housework;Help with stairs or ramp for entrance   Equipment Recommendations  Wheelchair (measurements PT);Wheelchair cushion (measurements PT);Other (comment);Hospital bed (Needs wide w/c with cushion and anti-tippers with ?offset wheels to reduce risk of tipping backwards)     Recommendations for Other Services       Precautions / Restrictions Precautions Precautions: Fall Precaution Comments: bil AKA, 2L O2 at baseline, LUQ drain Restrictions Weight Bearing Restrictions: No RUE Weight Bearing: Weight bearing as tolerated LUE Weight Bearing: Weight bearing as tolerated     Mobility  Bed Mobility     Rolling: Supervision   Supine to sit: Supervision, Modified independent (Device/Increase time) Sit to supine: Supervision   General bed mobility comments: using rails; prior to OOB supervision for placing pad    Transfers Overall transfer level: Needs assistance Equipment used: Ambulation equipment used Transfers: Bed to chair/wheelchair/BSC               Transfer via Lift Equipment: Maxisky (with amputee sling)  Ambulation/Gait                   Stairs             Wheelchair Mobility    Modified Rankin (Stroke Patients Only)       Balance     Sitting balance-[name] Scale: Good Sitting balance - Comments: in wheelchair; pt requests chest strap (made with gait belts) due to fear of falling                                    Cognition Arousal/Alertness: Awake/alert Behavior During Therapy: WFL for tasks assessed/performed Overall Cognitive Status: Within Functional Limits for tasks assessed                                          Exercises Other Exercises Other Exercises: bolstered bridging with pillows under  bilateral LEs; stabilized while pt perfomed bridging x6    General Comments General comments (skin integrity, edema, etc.): pt on RA upon arrival, did not show any signs of increased dyspnea; discussed ways to manage phantom pain and sensations; a lot of session was spent to garner rapport; once in chair encouranged pt to eat lunch (reported onset of headache while in lift due to lack of food); pt initated conversation about doctors wanting her to undergo HD in chair.       Pertinent Vitals/Pain Pain Assessment Pain Assessment: Faces Faces Pain Scale: Hurts a little bit Pain Location: R buttock/ischium Pain Descriptors / Indicators: Discomfort Pain Intervention(s): Premedicated before session, Repositioned    Home Living                          Prior Function            PT Goals (current goals can now be found in the care plan section) Acute Rehab PT Goals Patient Stated Goal: show students how to help her out of bed via lift PT Goal Formulation: With patient Time For Goal Achievement: 02/06/22 Potential to Achieve Goals: Good Progress towards PT goals: Progressing toward goals    Frequency    Min 2X/week      PT Plan Current plan remains appropriate    Co-evaluation              AM-PAC PT "6 Clicks" Mobility   Outcome Measure  Help needed turning from your back to your side while in a flat bed without using bedrails?: None Help needed moving from lying on your back to sitting on the side of a flat bed without using bedrails?: A Lot Help needed moving to and from a bed to a chair (including a wheelchair)?: Total Help needed standing up from a chair using your arms (e.g., wheelchair or bedside chair)?: Total Help needed to walk in hospital room?: Total Help needed climbing 3-5 steps with a railing? : Total 6 Click Score: 10    End of Session   Activity Tolerance: Patient tolerated treatment well Patient left: with call bell/phone within reach;in chair (in [facility] eating lunch) Nurse Communication: Mobility status;Need for lift equipment PT Visit Diagnosis: Other abnormalities of gait and mobility (R26.89);Muscle weakness (generalized) (M62.81) Pain - Right/Left: Right Pain - part of body: Leg (ischial wound)     Time: 9381-8299 (minus 10mins for student education.) PT Time Calculation (min) (ACUTE ONLY): 79 min  Charges:  $Therapeutic Activity: 38-52 mins                     Courtney Gray, PT  Acute  Rehabilitation Services Office [phone]    Courtney Gray 01/28/2022, 5:38 PM

## 2022-01-28 NOTE — Progress Notes (Signed)
Progress Note   Patient: Courtney Gray WIO:[id number] DOB: 02-03-1971 DOA: 11/01/2021     88 DOS: the patient was seen and examined on 01/28/2022   Brief hospital course: 51 year old F ESRD on HD TTS,  recent lengthy hospitalization for Bacteroides [place] bacteremia, hepatic and splenic abscess --required amputation of third digit of left hand and underwent left AKA and had splenic drain as well as CT-guided drainage of liver abscess she had a line holiday on last admission on long-term IV antibiotics until 07/07/6832 diastolic CHF, PVD s/p bilateral AKA, IDDM-2, morbid obesity and  chronic sacral wound presenting with progressive shortness of breath in the setting of missed dialysis and admitted for hypertensive emergency  Per patient, she never missed her dialysis however according to outpatient HD records, it takes at least 4 staff members and [facility] lift for patient to be seated in the dialysis chair.    Symptoms improved with hemodialysis.  Patient underwent R UE venogram on 8/30.  Vascular surgery following for possible RUE HD access.    Patient is now waiting placement where she can get HD on stretcher that has been difficult to find.  Assessment and Plan: * End stage renal disease [facility]) Patient will continue renal replacement therapy per nephrology recommendations.  Needs placement to continue with HD as outpatient.  Very deconditioned, large sacral decubitus wound.   Patient today found seated in bed with good toleration and she is willing to try chair HD tomorrow.   Anemia of chronic kidney disease, Hgb has been stable.  Continue with EPO and Iron supplementation.   Metabolic bone disease, continue with sevelamer.   Hyperkalemia, hypomagnesemia, hypokalemia, hyponatremia, hypophosphatemia have been corrected with renal replacement therapy.   Hypertension Patient on midodrine on HD to prevent hypotension. Currently off antihypertensive medications.   Decubitus  ulcer of sacral region, stage 3 (HCC) Deep wound at the sacrum, patient not able to seat.   Continue with oral oxycodone, discontinue hydromorphone  Continue with local wound care.  Patient will try chair HD tomorrow     Type 2 diabetes mellitus with hyperlipidemia ([place]) Her glucose has been stable and insulin therapy has been discontinued.   Statin therapy with atorvastatin.   Bacteremia Patient has completed antibiotic therapy. Bacteroides, splenic abscess.   PVD (peripheral vascular disease) (HCC) Bilateral AKA.   Continue local wound care.    Class 3 obesity (HCC) Calculated BMI is 65,3   Acute on chronic diastolic CHF (congestive heart failure) (HCC) Volume regulation with ultrafiltration in hemodialysis Continue midodrine for blood pressure support.         Subjective: Patient with no chest pain or dyspnea, requesting regular diet   Physical Exam: Vitals:   01/27/22 2200 01/27/22 2215 01/28/22 0600 01/28/22 0854  BP: (!) 137/55 137/66 130/61 134/63  Pulse: 61 65 72 70  Resp:  20 18 16   Temp:  98.5 F (36.9 C) 98 F (36.7 C) 97.7 F (36.5 C)  TempSrc:  Oral  Oral  SpO2:  98% 98% 100%  Weight:      Height:       Neurology awake and alert ENT with mild pallor Cardiovascular with S1 and S2 present and rhythmic Respiratory with no rales or wheezing Abdomen with no distention  Bilateral AKA Data Reviewed:    Family Communication: no family at the bedside   Disposition: Status is: Inpatient Remains inpatient appropriate because: pending placement   Planned Discharge Destination: Skilled nursing facility      Author: Tawni Millers,  MD 01/28/2022 4:48 PM  For on call review [email or web address].

## 2022-01-28 NOTE — Progress Notes (Signed)
Spoke to inpt HD staff to request that attempt be made for pt receive HD in the chair tomorrow if possible. Attending and nephrologist agreeable to this request.   Melven Sartorius Renal Navigator [phone]

## 2022-01-29 LAB — RENAL FUNCTION PANEL
Albumin: 2.7 g/dL — ABNORMAL LOW (ref 3.5–5.0)
Anion gap: 14 (ref 5–15)
BUN: 34 mg/dL — ABNORMAL HIGH (ref 6–20)
CO2: 25 mmol/L (ref 22–32)
Calcium: 10.1 mg/dL (ref 8.9–10.3)
Chloride: 98 mmol/L (ref 98–111)
Creatinine, Ser: 8.23 mg/dL — ABNORMAL HIGH (ref 0.44–1.00)
GFR, Estimated: 5 mL/min — ABNORMAL LOW (ref >60)
Glucose, Bld: 98 mg/dL (ref 70–99)
Phosphorus: 5.8 mg/dL — ABNORMAL HIGH (ref 2.5–4.6)
Potassium: 5.6 mmol/L — ABNORMAL HIGH (ref 3.5–5.1)
Sodium: 137 mmol/L (ref 135–145)

## 2022-01-29 NOTE — Progress Notes (Signed)
Physical Therapy Treatment Patient Details Name: Courtney Gray MRN: [id number] DOB: 07-30-1970 Today's Date: 01/29/2022   History of Present Illness Pt adm 7/28 with acute pulmonary edema; large sacral ulcer limiting sitting tolerance which makes SNF placement difficult for HD. 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    PT Comments    Continuing work on functional mobility and activity tolerance; Seen close to noon after extensive work to coordinate having her up in the recliner for HD after meeting her requested conditions; session focused on transfering to HD recliner via maxisky and sitting tolerance in HD recliner; pt on RA upon arrival and throughout session, no signs of distress or dsypnea; pt reported no pain during session, slight discomfort in maxisky lift d/t strap placement, pain subsided when straps repositioned; ended session with pt in HD recliner awaiting transport to HD; pt progressing towards goals; d/c to SNF remains appropriate; PT will continue to follow.    Noteworthy that it did not take 8 people to help her in to the HD recliner via the [facility]; we had a group of PT students from [place] joining [place], and were also training 39M floor staff in use of the [facility] amputee sling; Typically, it would only take 2 people to use the [facility] to help her up.   Recommendations for follow up therapy are one component of a multi-disciplinary discharge planning process, led by the attending physician.  Recommendations may be updated based on patient status, additional functional criteria and insurance authorization.  Follow Up Recommendations  Skilled nursing-short term rehab (<3 hours/day) Can patient physically be transported by private vehicle: No   Assistance Recommended at Discharge Intermittent Supervision/Assistance  Patient can return home with the following Two people to help with walking  and/or transfers;Two people to help with bathing/dressing/bathroom;Assist for transportation;Assistance with cooking/housework;Help with stairs or ramp for entrance   Equipment Recommendations  Wheelchair (measurements PT);Wheelchair cushion (measurements PT);Other (comment);Hospital bed    Recommendations for Other Services       Precautions / Restrictions Precautions Precautions: Fall Precaution Comments: bil AKA, 2L O2 at baseline, LUQ drain Restrictions Weight Bearing Restrictions: No RUE Weight Bearing: Weight bearing as tolerated LUE Weight Bearing: Weight bearing as tolerated     Mobility  Bed Mobility Overal bed mobility: Modified Independent Bed Mobility: Rolling Rolling: Supervision   Supine to sit: Modified independent (Device/Increase time) Sit to supine: Modified independent (Device/Increase time)   General bed mobility comments: pt able to roll well with use of bed rails; pulls to sit with bedrails    Transfers Overall transfer level: Needs assistance Equipment used: Ambulation equipment used Transfers: Bed to chair/wheelchair/BSC             General transfer comment: Lifted to HD recliner with amputee sky sling and +2 assist Transfer via Lift Equipment: Maxisky  Ambulation/Gait                   Stairs             Wheelchair Mobility    Modified Rankin (Stroke Patients Only)       Balance Overall balance assessment: Modified Independent Sitting-balance support: No upper extremity supported Sitting balance-[name] Scale: Good Sitting balance - Comments: pt demonstrates good sitting balance in bed; requests chest strap d/t fear of falling in HD recliner  Cognition Arousal/Alertness: Awake/alert Behavior During Therapy: WFL for tasks assessed/performed Overall Cognitive Status: Within Functional Limits for tasks assessed                                           Exercises      General Comments General comments (skin integrity, edema, etc.): Pt on RA for most of session, no signs of dyspnea or distress; pt comfortable in HD chair, awaiting transport to HD; pt voiced groggines at beginning of session d/t being woken up earlier than she's used to, no signs of confusion or agitation. At end of session, pt took time to express gratitude for students and therapist. HD recliner seat cushions set up  with Roho to patient's L, blue air cushion over Roho, and 2 yellow egg crate foams rolled and put in pillow case packed into the seat space on pt's R      Pertinent Vitals/Pain Pain Assessment Pain Assessment: Faces Faces Pain Scale: Hurts little more Pain Location: anterior/medial R hip (Pt stated pain d/t postition of lift equipment) Pain Descriptors / Indicators: Discomfort Pain Intervention(s): Repositioned, Monitored during session    Home Living                          Prior Function            PT Goals (current goals can now be found in the care plan section) Acute Rehab PT Goals Patient Stated Goal: Increase sitting tolerance and comfort in HD recliner. PT Goal Formulation: With patient Time For Goal Achievement: 02/06/22 Potential to Achieve Goals: Good Progress towards PT goals: Progressing toward goals    Frequency    Min 2X/week      PT Plan Current plan remains appropriate    Co-evaluation     PT goals addressed during session: Mobility/safety with mobility        AM-PAC PT "6 Clicks" Mobility   Outcome Measure  Help needed turning from your back to your side while in a flat bed without using bedrails?: None Help needed moving from lying on your back to sitting on the side of a flat bed without using bedrails?: A Lot Help needed moving to and from a bed to a chair (including a wheelchair)?: Total Help needed standing up from a chair using your arms (e.g., wheelchair or bedside chair)?: Total Help needed to  walk in hospital room?: Total Help needed climbing 3-5 steps with a railing? : Total 6 Click Score: 10    End of Session   Activity Tolerance: Patient tolerated treatment well Patient left: in chair;with call bell/phone within reach Nurse Communication: Mobility status;Need for lift equipment PT Visit Diagnosis: Other abnormalities of gait and mobility (R26.89);Muscle weakness (generalized) (M62.81) Pain - Right/Left: Right Pain - part of body: Hip;Leg     Time: 7989-2119 PT Time Calculation (min) (ACUTE ONLY): 35 min  Charges:  $Therapeutic Activity: 23-37 mins                     Courtney Gray, PT  Acute Rehabilitation Services Office [phone]    Courtney Gray 01/29/2022, 4:52 PM

## 2022-01-29 NOTE — Progress Notes (Signed)
Mobility Specialist Progress Note:   01/29/22 1500  Mobility  Activity Transferred from chair to bed  Level of Assistance +2 (takes two people)  Civil engineer, contracting / Overhead Lift  Activity Response Tolerated well  Mobility Referral Yes  $Mobility charge 1 Mobility   Pt requesting to transfer back to bed after refusing HD. Tolerated transfer well.  Nelta Numbers Acute Rehab Secure Chat or Office Phone: [phone]

## 2022-01-29 NOTE — Progress Notes (Signed)
Pt refused to come to unit for HD tx. Stating it is too early. Called Provider to notify as well as Mudlogger.

## 2022-01-29 NOTE — Progress Notes (Addendum)
[facility] Progress Note   Subjective:   Patient seen and examined in room.  Sitting in chair for dialysis but refused to go when transport arrived because she needs to go to the bathroom and wants to get back in the bed to use the bedpan.  Nursing staff notified.  States he arm is hurting because she has no where to put it.  States "I dont know if I can do this." Per nursing 8 people assisted in her transfer to the chair.   Objective Vitals:   01/28/22 2153 01/29/22 0259 01/29/22 0530 01/29/22 0824  BP: (!) 119/37  (!) 130/55 119/63  Pulse: 67  63 66  Resp: 20  18 16   Temp: 98.8 F (37.1 C)  99 F (37.2 C) 98.2 F (36.8 C)  TempSrc: Oral  Oral Oral  SpO2: 100%  100% 100%  Weight: 117.9 kg 117.9 kg    Height: 5\' 5"  (1.651 m)      Physical Exam General:WD, obese female laying in chair Heart:RRR, no mrg Lungs:CTAB, nml WOB on 2L O2 via [place] Abdomen:soft, NTND Extremities:no LE edema, b/l AKA Dialysis Access: [facility]   [facility] Weights   01/24/22 2300 01/28/22 2153 01/29/22 0259  Weight: 112.2 kg 117.9 kg 117.9 kg    Intake/Output Summary (Last 24 hours) at 01/29/2022 1440 Last data filed at 01/29/2022 0600 Gross per 24 hour  Intake 1040 ml  Output 0 ml  Net 1040 ml    Additional Objective Labs: Basic Metabolic Panel: Recent Labs  Lab 01/24/22 1850 01/27/22 1830  NA 137 134*  K 5.0 4.7  CL 99 93*  CO2 25 25  GLUCOSE 105* 111*  BUN 37* 46*  CREATININE 8.00* 9.44*  CALCIUM 9.3 10.0  PHOS 7.0* 7.3*   Liver Function Tests: Recent Labs  Lab 01/24/22 1850 01/27/22 1830  ALBUMIN 2.7* 2.9*   CBC: Recent Labs  Lab 01/24/22 1850 01/27/22 1810  WBC 9.2 10.1  HGB 7.5* 7.9*  HCT 25.5* 26.3*  MCV 100.4* 98.9  PLT 263 294   CBG: Recent Labs  Lab 01/25/22 0248  GLUCAP 95   Iron Studies: No results for input(s): "IRON", "TIBC", "TRANSFERRIN", "FERRITIN" in the last 72 hours. Lab Results  Component Value Date   INR 1.3 (H) 10/09/2021   INR 1.3  (H) 09/10/2021   INR 1.3 (H) 07/10/2021   Studies/Results: No results found.  Medications:  albumin human     ferric gluconate (FERRLECIT) IVPB Stopped (01/27/22 2100)    aspirin EC  81 mg Oral Daily   atorvastatin  10 mg Oral Daily   darbepoetin (ARANESP) injection - DIALYSIS  200 mcg Intravenous Q Fri-HD   docusate sodium  100 mg Oral BID   feeding supplement  1 Container Oral TID BM   gabapentin  100 mg Oral Q1200   gabapentin  200 mg Oral QHS   heparin injection (subcutaneous)  5,000 Units Subcutaneous Q8H   levothyroxine  100 mcg Oral Q0600   midodrine  10 mg Oral Q M,W,F-HD   pantoprazole  40 mg Oral Daily   polyethylene glycol  17 g Oral Daily   sevelamer carbonate  4.8 g Oral TID WC    Dialysis Orders: MWF - [facility]  4hrs, BFR 400, DFR 600,  AVF, EDW 146kg, 3K/ 2Ca - last hep B labs 8/21 - Heparin 2500 units IV with HD - Hectorol 97mcg IV q HD, held as of 10/30/21   Assessment/Plan: ESRD- HD schedule MWF.  Next HD 10/25 per regular schedule. Requesting to be scheduled after lunch, will accommodate as able. Requested patient be in chair which she agreed to but then refused to come to HD.  Will try to get her later today.  Hyperkalemia -  resolved. K elevated w/regular diet. Lokelma given & changed back to heart healthy diet, then changed again back to Regular diet.  Last K 4.7.  If K becomes elevated again will require renal diet.   HTN/volume- Metoprolol DC'd 9/01. On midodrine 10mg  qHD, with 10mg  prn mid HD w/parameters to keep SBP>100 d/t becoming symptomatic when it drops lower.  Significant weight loss during admission. Last  hoyer  wt=118 kg lower at discharge. Remains euvolemic on exam.  Severe ischial sacral decubitus wound: Per primary/wound care team.  Limiting her, noted to be slow healing.  Goal to tolerate sitting upright - which she is doing.  see Dr Verlon Au pic.01/17/22 COPD history of hypoxia on [place] O2 at home(was on 5 L at baseline has been  able to taper to 2-3 L ) Anemia of ESRD - Hgb^7.9. on high-dose ESA q. Monday last saturation 19% with ferritin 935. Getting iron load.  Secondary hyperparathyroidism - phos back up now on heart healthy diet. Increased Renvela to 4.8g AC TID on 10/24.  Calcium in goal. Not on VDRA, last pth 61 in August - recheck. 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 Nutrition - diet continues to be changed from renal.  Currently on heart healthy diet.  Phos now elevated requiring ^binders. If K^, will need to change back to Renal diet and stay on renal diet.  Disposition: Difficult situation. Prior to admit, patient required 6 staff members and 2 hoyer pads for transfer into HD chair as OP. Could not stay on HD for full tx. 2/2 pain of multiple decubitus ulcers and PTAR unable to lift pt into ambulance for transport. SW has been unable to locate stretcher dialysis location so far. Her wt .is much lower from admit, asked PT about trying to hoyer to recliner for HD -> apparently would need hoyer that wraps around her legs. Tolerated 3hrs in chair so far but had 8 people assist getting her from bed to chair.  Will need to tolerate 5-6hrs in a chair to tolerate OP dialysis including transport, treatment and cannulation time.  The main issue is her Rt ischial wound. PT is using a high-quality air cushion from the CIR unit. This remains to be very painful for her to sit. Please see renal navigator note from 12/23/21. Renal navigator has reached out to several out of state facilities to see if they have onsite stretcher dialysis - waiting for return calls.  Jen Mow, PA-C [facility] 01/29/2022,2:40 PM  LOS: 89 days    Seen and examined independently this AM.  Agree with note and exam as documented above by physician extender and as noted here.  She was in a chair for dialysis awaiting transport when I saw her.  Ultimately later declined to go as above.  Per charting  above 8 people assisted with getting her into the chair.  I let her know that I appreciated her getting in the chair.   General adult female in chair in no acute distress, morbidly obese habitus HEENT normocephalic atraumatic extraocular movements intact sclera anicteric Neck supple trachea midline Lungs clear to auscultation bilaterally normal work of breathing at rest on 2 liters Heart S1S2 no rub Abdomen soft nontender nondistended Extremities  bilateral AKA's; no edema of residual limbs appreciated  Psych normal mood and affect Access tunn catheter in place   ESRD - we have reduced UF with HD.  HD per MWF schedule.  she declined HD this am on first attempt and later declined it when she was actually sitting in a chair    Sacral decubitus wound - per primary team.  Prevents her from being able to tolerate in-center HD.  Her team reached out yesterday after she agreed to try HD in a chair today but this ultimately didn't happen as above   COPD - on 2 liters oxygen here and previously much more    Disposition - appreciate SW assistance. SW has been unable to locate stretcher dialysis location so far.  She is not able to be discharged without an accepting outpatient HD unit.      Claudia Desanctis, MD 01/29/2022  3:26 PM

## 2022-01-29 NOTE — Progress Notes (Signed)
Patient seen and examined, no changes from Dr. 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 -Admitted, symptoms improved after hemodialysis -Now awaiting placement to dialysis center where she can get treatment on a stretcher -Also continue local wound care -For HD in chair today if tolerated  Domenic Polite MD

## 2022-01-30 DIAGNOSIS — N186 End stage renal disease: Secondary | ICD-10-CM | POA: Diagnosis not present

## 2022-01-30 LAB — CBC
HCT: 25.9 % — ABNORMAL LOW (ref 36.0–46.0)
Hemoglobin: 7.8 g/dL — ABNORMAL LOW (ref 12.0–15.0)
MCH: 30 pg (ref 26.0–34.0)
MCHC: 30.1 g/dL (ref 30.0–36.0)
MCV: 99.6 fL (ref 80.0–100.0)
Platelets: 297 10*3/uL (ref 150–400)
RBC: 2.6 MIL/uL — ABNORMAL LOW (ref 3.87–5.11)
RDW: 15.5 % (ref 11.5–15.5)
WBC: 10.6 10*3/uL — ABNORMAL HIGH (ref 4.0–10.5)
nRBC: 0 % (ref 0.0–0.2)

## 2022-01-30 LAB — RENAL FUNCTION PANEL
Albumin: 2.7 g/dL — ABNORMAL LOW (ref 3.5–5.0)
Anion gap: 18 — ABNORMAL HIGH (ref 5–15)
BUN: 41 mg/dL — ABNORMAL HIGH (ref 6–20)
CO2: 26 mmol/L (ref 22–32)
Calcium: 10.1 mg/dL (ref 8.9–10.3)
Chloride: 94 mmol/L — ABNORMAL LOW (ref 98–111)
Creatinine, Ser: 9.87 mg/dL — ABNORMAL HIGH (ref 0.44–1.00)
GFR, Estimated: 4 mL/min — ABNORMAL LOW (ref >60)
Glucose, Bld: 148 mg/dL — ABNORMAL HIGH (ref 70–99)
Phosphorus: 6.8 mg/dL — ABNORMAL HIGH (ref 2.5–4.6)
Potassium: 5.5 mmol/L — ABNORMAL HIGH (ref 3.5–5.1)
Sodium: 138 mmol/L (ref 135–145)

## 2022-01-30 MED ORDER — HEPARIN SODIUM (PORCINE) 1000 UNIT/ML DIALYSIS
2000.0000 [IU] | INTRAMUSCULAR | Status: DC | PRN
  Administered 2022-01-30 – 2022-01-31 (×2): 2000 [IU] via INTRAVENOUS_CENTRAL

## 2022-01-30 MED ORDER — DARBEPOETIN ALFA 200 MCG/0.4ML IJ SOSY
200.0000 ug | PREFILLED_SYRINGE | INTRAMUSCULAR | Status: DC
  Administered 2022-01-31: 200 ug via SUBCUTANEOUS
  Filled 2022-01-30 (×2): qty 0.4

## 2022-01-30 MED ORDER — HEPARIN SODIUM (PORCINE) 1000 UNIT/ML IJ SOLN
INTRAMUSCULAR | Status: AC
  Filled 2022-01-30: qty 2

## 2022-01-30 NOTE — Progress Notes (Signed)
Physical Therapy Treatment Patient Details Name: Courtney Gray MRN: [id number] DOB: 1970-11-30 Today's Date: 01/30/2022   History of Present Illness Pt adm 7/28 with acute pulmonary edema; large sacral ulcer limiting sitting tolerance which makes SNF placement difficult for HD. 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    PT Comments    Initially reluctant to participate due to fatigue but with encouragement, agreeable to participate. Patient modI for rolling L/R for sling placement. Able to transfer to w/c via maxisky with +2 assist. Discussed going to HD in chair but patient declined. Able to self propel w/c with minA for unlevel surfaces and x2 rest breaks. Continue to recommend SNF for ongoing Physical Therapy.        Recommendations for follow up therapy are one component of a multi-disciplinary discharge planning process, led by the attending physician.  Recommendations may be updated based on patient status, additional functional criteria and insurance authorization.  Follow Up Recommendations  Skilled nursing-short term rehab (<3 hours/day) Can patient physically be transported by private vehicle: No   Assistance Recommended at Discharge Intermittent Supervision/Assistance  Patient can return home with the following Two people to help with walking and/or transfers;Two people to help with bathing/dressing/bathroom;Assist for transportation;Assistance with cooking/housework;Help with stairs or ramp for entrance   Equipment Recommendations  Wheelchair (measurements PT);Wheelchair cushion (measurements PT);Other (comment);Hospital bed    Recommendations for Other Services       Precautions / Restrictions Precautions Precautions: Fall Precaution Comments: bil AKA, 2L O2 at baseline Restrictions Weight Bearing Restrictions: No     Mobility  Bed Mobility Overal bed mobility: Modified  Independent                  Transfers                   General transfer comment: Lifted to w/c with amputee sling and +2 assist    Ambulation/Gait                   Theme park manager mobility: Yes Wheelchair propulsion: Both upper extremities Wheelchair parts: Needs assistance Distance: 400 (+rest, +400) Wheelchair Assistance Details (indicate cue type and reason): assist over unlevel surface  Modified Rankin (Stroke Patients Only)       Balance   Sitting-balance support: No upper extremity supported Sitting balance-[name] Scale: Good Sitting balance - Comments: pt demonstrates good sitting balance in bed; requests chest strap due to fear of falling out of w/c                                    Cognition Arousal/Alertness: Awake/alert Behavior During Therapy: WFL for tasks assessed/performed Overall Cognitive Status: Within Functional Limits for tasks assessed                                          Exercises      General Comments        Pertinent Vitals/Pain Pain Assessment Pain Assessment: Faces Faces Pain Scale: Hurts little more Pain Location: anterior/medial R hip Pain Descriptors / Indicators: Discomfort Pain Intervention(s): Monitored during session    Home Living  Prior Function            PT Goals (current goals can now be found in the care plan section) Acute Rehab PT Goals Patient Stated Goal: Increase sitting tolerance and comfort in HD recliner. PT Goal Formulation: With patient Time For Goal Achievement: 02/06/22 Potential to Achieve Goals: Good Progress towards PT goals: Progressing toward goals    Frequency    Min 2X/week      PT Plan Current plan remains appropriate    Co-evaluation              AM-PAC PT "6 Clicks" Mobility   Outcome Measure  Help needed turning  from your back to your side while in a flat bed without using bedrails?: None Help needed moving from lying on your back to sitting on the side of a flat bed without using bedrails?: A Lot Help needed moving to and from a bed to a chair (including a wheelchair)?: Total Help needed standing up from a chair using your arms (e.g., wheelchair or bedside chair)?: Total Help needed to walk in hospital room?: Total Help needed climbing 3-5 steps with a railing? : Total 6 Click Score: 10    End of Session Equipment Utilized During Treatment: Oxygen Activity Tolerance: Patient tolerated treatment well Patient left: in bed;with call bell/phone within reach Nurse Communication: Mobility status;Need for lift equipment PT Visit Diagnosis: Other abnormalities of gait and mobility (R26.89);Muscle weakness (generalized) (M62.81) Pain - Right/Left: Right Pain - part of body: Hip;Leg     Time: 1102-1208 PT Time Calculation (min) (ACUTE ONLY): 66 min  Charges:  $Therapeutic Activity: 23-37 mins $Wheel Chair Management: 23-37 mins                     Lucan Riner A. Gilford Rile PT, DPT Acute Rehabilitation Services Office [phone]    [name] 01/30/2022, 1:13 PM

## 2022-01-30 NOTE — Progress Notes (Signed)
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 -Admitted, symptoms improved after hemodialysis -Now awaiting placement to dialysis center where she can get treatment on a stretcher   Subjective: -Could not go to dialysis yesterday as she was having multiple BMs at the time  Assessment and Plan:  Volume overload End stage renal disease ([place]) -See discussion above, for HD today -Reportedly she will try to dialyze in a chair today -TOC team following, to assist with placement and dialysis center where she can dialyze on a stretcher -Complicated by large sacral decubitus wound and bilateral AKA  Anemia of chronic kidney disease, Hgb has been stable.  Continue with EPO and Iron supplementation.   Hyperkalemia, hypomagnesemia, hypokalemia, hyponatremia, hypophosphatemia -corrected with renal replacement therapy.   Hypertension Patient on midodrine on HD to prevent hypotension. Currently off antihypertensive medications.   Decubitus ulcer of sacral region, stage 3 (HCC) Deep wound at the sacrum, patient not able to sit  Continue with oral oxycodone, Continue with local wound care.  Type 2 diabetes mellitus with hyperlipidemia ([place]) -Stable  Bacteremia Patient has completed antibiotic therapy. Bacteroides, splenic abscess.   PVD (peripheral  vascular disease) (HCC) Bilateral AKA.   COPD -Stable, on home O2 2 L  Class 3 obesity (HCC) Calculated BMI is 65,3   Acute on chronic diastolic CHF (congestive heart failure) (HCC) Volume regulation with ultrafiltration in hemodialysis Continue midodrine for blood pressure support.   DVT prophylaxis: Heparin subcutaneous Code Status: Full code Family Communication: No family at bedside Disposition Plan: To be determined  Consultants:    Procedures:   Antimicrobials:    Objective: Vitals:   01/30/22 0500 01/30/22 0611 01/30/22 0613 01/30/22 0825  BP:  (!) 128/111 (!) 151/62 (!) 137/45  Pulse:  78 68 68  Resp:  18  16  Temp:  98.1 F (36.7 C)  98.2 F (36.8 C)  TempSrc:  Oral  Oral  SpO2:  100%  100%  Weight: 117.9 kg     Height:        Intake/Output Summary (Last 24 hours) at 01/30/2022 1011 Last data filed at 01/30/2022 0800 Gross per 24 hour  Intake 680 ml  Output 1 ml  Net 679 ml   Filed Weights   01/28/22 2153 01/29/22 0259 01/30/22 0500  Weight: 117.9 kg 117.9 kg 117.9 kg    Examination:  General exam: Appears calm and comfortable  Respiratory system: Clear to auscultation Cardiovascular system: S1 & S2 heard, RRR.  Abd: nondistended, soft and nontender.Normal bowel sounds heard. Central nervous system: Alert and oriented. No focal neurological deficits. Extremities: no edema Skin: Sacral decubitus wound Psychiatry:  Mood & affect appropriate.     Data Reviewed:   CBC: Recent Labs  Lab 01/24/22 1850 01/27/22 1810  WBC 9.2 10.1  HGB 7.5* 7.9*  HCT 25.5* 26.3*  MCV 100.4* 98.9  PLT 263 160   Basic Metabolic Panel: Recent Labs  Lab 01/24/22 1850 01/27/22 1830 01/29/22 2235  NA 137 134* 137  K 5.0 4.7 5.6*  CL 99 93* 98  CO2 25 25 25   GLUCOSE 105* 111* 98  BUN 37* 46* 34*  CREATININE 8.00* 9.44* 8.23*  CALCIUM 9.3 10.0 10.1  PHOS 7.0* 7.3* 5.8*   GFR: Estimated Creatinine Clearance: 10.4 mL/min (A) (by C-G formula based  on SCr of 8.23 mg/dL (H)). Liver Function Tests: Recent Labs  Lab 01/24/22 1850 01/27/22 1830 01/29/22 2235  ALBUMIN 2.7* 2.9* 2.7*   No results for input(s): "LIPASE", "AMYLASE" in the last 168 hours. No results for input(s): "AMMONIA" in the last 168 hours. Coagulation Profile: No results for input(s): "INR", "PROTIME" in the last 168 hours. Cardiac Enzymes: No results for input(s): "CKTOTAL", "CKMB", "CKMBINDEX", "TROPONINI" in the last 168 hours. BNP (last 3 results) No results for input(s): "PROBNP" in the last 8760 hours. HbA1C: No results for input(s): "HGBA1C" in the last 72 hours. CBG: Recent Labs  Lab 01/25/22 0248  GLUCAP 95   Lipid Profile: No results for input(s): "CHOL", "HDL", "LDLCALC", "TRIG", "CHOLHDL", "LDLDIRECT" in the last 72 hours. Thyroid Function Tests: No results for input(s): "TSH", "T4TOTAL", "FREET4", "T3FREE", "THYROIDAB" in the last 72 hours. Anemia Panel: No results for input(s): "VITAMINB12", "FOLATE", "FERRITIN", "TIBC", "IRON", "RETICCTPCT" in the last 72 hours. Urine analysis: No results found for: "COLORURINE", "APPEARANCEUR", "LABSPEC", "PHURINE", "GLUCOSEU", "HGBUR", "BILIRUBINUR", "KETONESUR", "PROTEINUR", "UROBILINOGEN", "NITRITE", "LEUKOCYTESUR" Sepsis Labs: @LABRCNTIP (procalcitonin:4,lacticidven:4)  )No results found for this or any previous visit (from the past 240 hour(s)).   Radiology Studies: No results found.   Scheduled Meds:  aspirin EC  81 mg Oral Daily   atorvastatin  10 mg Oral Daily   darbepoetin (ARANESP) injection - DIALYSIS  200 mcg Intravenous Q Fri-HD   docusate sodium  100 mg Oral BID   feeding supplement  1 Container Oral TID BM   gabapentin  100 mg Oral Q1200   gabapentin  200 mg Oral QHS   heparin injection (subcutaneous)  5,000 Units Subcutaneous Q8H   levothyroxine  100 mcg Oral Q0600   midodrine  10 mg Oral Q M,W,F-HD   pantoprazole  40 mg Oral Daily   polyethylene glycol  17 g Oral Daily    sevelamer carbonate  4.8 g Oral TID WC   Continuous Infusions:  albumin human     ferric gluconate (FERRLECIT) IVPB Stopped (01/27/22 2100)     LOS: 90 days    Time spent: 16min    Domenic Polite, MD [facility]   01/30/2022, 10:11 AM

## 2022-01-30 NOTE — Progress Notes (Signed)
This RN discussed dialysis treatment today with patient.  Patient agrees to go but not in a chair. Updated Dialysis.  Aurther Loft, RN

## 2022-01-30 NOTE — Progress Notes (Addendum)
[facility] Progress Note   Subjective:   Patient seen and examined at bedside.  Reports abdominal discomfort and multiple BM yesterday.  Does not like the IBSC medication she is on - unsure which one is causing her to have discomfort.  Denies CP, SOB and n/v/d.  Plans to go to HD today.    Per PT, she did not require 8 people to assist in hoyer transfer to chair yesterday. They were training and had 8 people in the room but she can be transferred with 2 people if needed.   Objective Vitals:   01/30/22 0500 01/30/22 0611 01/30/22 0613 01/30/22 0825  BP:  (!) 128/111 (!) 151/62 (!) 137/45  Pulse:  78 68 68  Resp:  18  16  Temp:  98.1 F (36.7 C)  98.2 F (36.8 C)  TempSrc:  Oral  Oral  SpO2:  100%  100%  Weight: 117.9 kg     Height:       Physical Exam General:WD, obese female in NAD Heart:RRR, no mrg Lungs:CTAB, nml WOB Abdomen:soft, NT Extremities:b/l AKA Dialysis Access: [facility]   Filed Weights   01/28/22 2153 01/29/22 0259 01/30/22 0500  Weight: 117.9 kg 117.9 kg 117.9 kg    Intake/Output Summary (Last 24 hours) at 01/30/2022 1233 Last data filed at 01/30/2022 0800 Gross per 24 hour  Intake 680 ml  Output 1 ml  Net 679 ml    Additional Objective Labs: Basic Metabolic Panel: Recent Labs  Lab 01/24/22 1850 01/27/22 1830 01/29/22 2235  NA 137 134* 137  K 5.0 4.7 5.6*  CL 99 93* 98  CO2 25 25 25   GLUCOSE 105* 111* 98  BUN 37* 46* 34*  CREATININE 8.00* 9.44* 8.23*  CALCIUM 9.3 10.0 10.1  PHOS 7.0* 7.3* 5.8*   Liver Function Tests: Recent Labs  Lab 01/24/22 1850 01/27/22 1830 01/29/22 2235  ALBUMIN 2.7* 2.9* 2.7*   CBC: Recent Labs  Lab 01/24/22 1850 01/27/22 1810  WBC 9.2 10.1  HGB 7.5* 7.9*  HCT 25.5* 26.3*  MCV 100.4* 98.9  PLT 263 294   CBG: Recent Labs  Lab 01/25/22 0248  GLUCAP 95    Medications:  albumin human     ferric gluconate (FERRLECIT) IVPB Stopped (01/27/22 2100)    aspirin EC  81 mg Oral Daily    atorvastatin  10 mg Oral Daily   [START ON 01/31/2022] darbepoetin (ARANESP) injection - DIALYSIS  200 mcg Subcutaneous Q Fri-1800   docusate sodium  100 mg Oral BID   feeding supplement  1 Container Oral TID BM   gabapentin  100 mg Oral Q1200   gabapentin  200 mg Oral QHS   heparin injection (subcutaneous)  5,000 Units Subcutaneous Q8H   levothyroxine  100 mcg Oral Q0600   midodrine  10 mg Oral Q M,W,F-HD   pantoprazole  40 mg Oral Daily   polyethylene glycol  17 g Oral Daily   sevelamer carbonate  4.8 g Oral TID WC    Dialysis Orders: MWF - [facility]  4hrs, BFR 400, DFR 600,  AVF, EDW 146kg, 3K/ 2Ca - last hep B labs 8/21 - Heparin 2500 units IV with HD - Hectorol 36mcg IV q HD, held as of 10/30/21   Assessment/Plan: ESRD- HD schedule MWF. Off schedule today due to refusing HD yesterday x2.  Will continue off schedule (Today & Sat) the rest of the week and resume regular schedule Monday. Requesting to be scheduled after lunch, will accommodate as  able. Requested patient be in chair but she is refusing today.  Hyperkalemia -  resolved. K elevated w/regular diet. Lokelma given & changed back to heart healthy diet, then changed again back to Regular diet.  Last K 5.6 after missing HD yesterday.  If K becomes elevated again will require renal diet.   HTN/volume- Metoprolol DC'd 9/01. On midodrine 10mg  qHD, with 10mg  prn mid HD w/parameters to keep SBP>100 d/t becoming symptomatic when it drops lower.  Significant weight loss during admission. Last  hoyer  wt=118 kg lower at discharge. Remains euvolemic on exam.  Severe ischial sacral decubitus wound: Per primary/wound care team.  Limiting her, noted to be slow healing.  Goal to tolerate sitting upright - which she is doing.  see Dr Verlon Au pic.01/17/22 COPD history of hypoxia on [place] O2 at home(was on 5 L at baseline has been able to taper to 2-3 L [place]) Anemia of ESRD - Hgb^7.9. on high-dose ESA q. Monday last saturation 19% with  ferritin 935. Getting iron load.  Secondary hyperparathyroidism - phos back up now on heart healthy diet. Increased Renvela to 4.8g AC TID on 10/24 - phos closer to goal today.  Calcium in goal. Not on VDRA, last pth 38 in August - recheck. 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 Nutrition - diet continues to be changed from renal.  Currently on heart healthy diet.  Phos now elevated requiring ^binders. If K^, will need to change back to Renal diet and stay on renal diet.  Disposition: Difficult situation. Prior to admit, patient required 6 staff members and 2 hoyer pads for transfer into HD chair as OP. Could not stay on HD for full tx. 2/2 pain of multiple decubitus ulcers and PTAR unable to lift pt into ambulance for transport. SW has been unable to locate stretcher dialysis location so far. Her wt .is much lower from admit, asked PT about trying to hoyer to recliner for HD -> apparently would need hoyer that wraps around her legs. Tolerated 3hrs in chair so far.  Will need to tolerate 5-6hrs in a chair to tolerate OP dialysis including transport, treatment and cannulation time.  The main issue is her Rt ischial wound. PT is using a high-quality air cushion from the CIR unit. This remains to be very painful for her to sit. Please see renal navigator note from 12/23/21. Renal navigator has reached out to several out of state facilities to see if they have onsite stretcher dialysis - waiting for return calls.  Jen Mow, PA-C [facility] 01/30/2022,12:33 PM  LOS: 90 days    Seen and examined independently.  Agree with note and exam as documented above by physician extender and as noted here.  She refused HD yesterday.  States IBS has been giving her trouble.   General adult female in chair in no acute distress, morbidly obese habitus HEENT normocephalic atraumatic extraocular movements intact sclera anicteric Neck supple trachea midline Lungs clear  to auscultation bilaterally normal work of breathing at rest on 2 liters Heart S1S2 no rub Abdomen soft nontender nondistended Extremities bilateral AKA's; no edema of residual limbs appreciated  Psych normal mood and affect Access tunn catheter in place   ESRD - HD per MWF schedule.  she got in a chair yesterday with assistance but not willing to try again today   Sacral decubitus wound - per primary team.  Prevents her from being able to tolerate in-center HD.  Her team reached out  yesterday after she agreed to try HD in a chair for this treatment but this ultimately didn't happen as above   COPD - on 2 liters oxygen here and previously much more    Disposition - appreciate SW assistance.  SW has been unable to locate stretcher dialysis location so far.  She is not able to be discharged without an accepting outpatient HD unit.     Claudia Desanctis, MD 01/30/2022 2:16 PM

## 2022-01-30 NOTE — Progress Notes (Signed)
RN notified the 63M floor nurse to see was the patient coming to Dialysis and the RN response that the pt was coming but not in a chair. Notified Dr. Royce Macadamia with results.

## 2022-01-31 MED ORDER — MIDODRINE HCL 5 MG PO TABS
10.0000 mg | ORAL_TABLET | Freq: Once | ORAL | Status: AC
  Administered 2022-02-01: 10 mg via ORAL
  Filled 2022-01-31: qty 2

## 2022-01-31 MED ORDER — HEPARIN SODIUM (PORCINE) 1000 UNIT/ML IJ SOLN
INTRAMUSCULAR | Status: AC
  Administered 2022-01-31: 3800 [IU]
  Filled 2022-01-31: qty 4

## 2022-01-31 MED ORDER — LACTULOSE 10 GM/15ML PO SOLN
20.0000 g | Freq: Two times a day (BID) | ORAL | Status: AC
  Administered 2022-01-31: 20 g via ORAL
  Filled 2022-01-31 (×2): qty 30

## 2022-01-31 NOTE — Progress Notes (Signed)
No changes from my note yesterday, went for HD in bed again, counseled -Reports being constipated, asking for enema -Add lactulose, enema later if needed  Domenic Polite, MD

## 2022-01-31 NOTE — TOC Progression Note (Signed)
Transition of Care [facility]) - Initial/Assessment Note    Patient Details  Name: Courtney Gray MRN: [id number] Date of Birth: 1970-09-22  Transition of Care [facility]) CM/SW Contact:    Milinda Antis, LCSWA Phone Number: 01/31/2022, 2:34 PM  Clinical Narrative:                 No new discharge updates. Patient still requiring stretcher dialysis.  Unable to locate an outpatient dialysis clinic that will accept.     TOC will continue to follow.    Expected Discharge Plan: Long Term Nursing Home Barriers to Discharge: No SNF bed, Transportation, Waiting for outpatient dialysis   Patient Goals and CMS Choice Patient states their goals for this hospitalization and ongoing recovery are:: To be at a facility close to her daughter      Expected Discharge Plan and Services Expected Discharge Plan: [place]       Living arrangements for the past 2 months: Single Family Home                                      Prior Living Arrangements/Services Living arrangements for the past 2 months: Single Family Home Lives with:: Adult Children Patient language and need for interpreter reviewed:: Yes        Need for Family Participation in Patient Care: Yes (Comment) Care giver support system in place?: No (comment)   Criminal Activity/Legal Involvement Pertinent to Current Situation/Hospitalization: No - Comment as needed  Activities of Daily Living Home Assistive Devices/Equipment: [employer] fast streamer ADL Screening (condition at time of admission) Patient's cognitive ability adequate to safely complete daily activities?: No Is the patient deaf or have difficulty hearing?: No Does the patient have difficulty seeing, even when wearing glasses/contacts?: No Does the patient have difficulty concentrating, remembering, or making decisions?: No Patient able to express need for assistance with ADLs?: Yes Does the patient have difficulty dressing or bathing?: Yes Independently  performs ADLs?: No Does the patient have difficulty walking or climbing stairs?: Yes Weakness of Legs: Both Weakness of Arms/Hands: Both  Permission Sought/Granted Permission sought to share information with : Other (comment) Permission granted to share information with : Yes, Verbal Permission Granted  Share Information with NAME: Maalouf,antoinette (Daughter)   [phone]  Permission granted to share info w AGENCY: SNF        Emotional Assessment Appearance:: Appears older than stated age Attitude/Demeanor/Rapport: Engaged Affect (typically observed): Tearful/Crying, Accepting Orientation: : Oriented to Situation, Oriented to  Time, Oriented to Place, Oriented to Self Alcohol / Substance Use: Not Applicable Psych Involvement: No (comment)  Admission diagnosis:  End stage renal disease ([place]) [N18.6] Fluid overload [E87.70] Obesity with serious comorbidity, unspecified classification, unspecified obesity type [E66.9] Pressure injury of right buttock, stage 2 (HCC) [L89.312] Patient Active Problem List   Diagnosis Date Noted   Acute on chronic diastolic CHF (congestive heart failure) ([place]) 12/12/2021   PAD (peripheral artery disease) ([place]) 12/12/2021   Irritable bowel syndrome (IBS) 12/12/2021   Amputation of left middle finger 12/12/2021   GERD (gastroesophageal reflux disease) 12/12/2021   Hypertension    Type 2 diabetes mellitus with hyperlipidemia ([place])    Decubitus ulcer of sacral region, stage 3 ([place])    Class 3 obesity ([place])    Bacteremia    PVD (peripheral vascular disease) ([place])    Hepatic abscess    Pressure injury of skin 09/13/2021  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HCC)    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   Lactic acidosis 09/10/2021   Mixed diabetic hyperlipidemia associated with type 2 diabetes mellitus ([place]) 09/10/2021   Coronary artery disease involving native coronary artery of native heart without angina pectoris 09/10/2021   Elevated troponin  level not due myocardial infarction 09/10/2021   Hypotension 09/10/2021   Dry gangrene (HCC)    Pain 08/03/2021   Ischemic foot pain at rest 08/03/2021   Hypoglycemia 07/10/2021   Volume overload 07/09/2021   Leukocytosis 06/11/2021   Acute gout 06/11/2021   Hyperkalemia 05/21/2021   Hemorrhoids    Hyperlipidemia 05/18/2021   Rectal bleeding 05/17/2021   Morbid obesity with BMI of 60.0-69.9, adult ([place]) 05/17/2021   NSTEMI (non-ST elevated myocardial infarction) ([place]) 05/16/2021   Essential hypertension 09/01/2020   End stage renal disease ([place]) 09/01/2020   Chronic respiratory failure with hypoxia ([place]) 09/01/2020   Hypertensive emergency    Hypertensive urgency 11/28/2019   Uncontrolled type 2 diabetes mellitus with hypoglycemia, with long-term current use of insulin ([place]) 11/28/2019   Anemia due to chronic kidney disease 11/28/2019   PCP:  Monico Blitz, MD Pharmacy:   [place] [place] [address] [place] [address] Phone: [phone] Fax: [phone]  [facility] [address] [place] [address] Phone: [phone] Fax: [phone]  [place], [place] - [address] [place] [address] Phone: [phone] Fax: [phone]     Social Determinants of Health ([place]) Interventions    Readmission Risk Interventions    09/16/2021    4:36 PM 08/28/2021   11:50 AM 07/12/2021   12:38 PM  Readmission Risk Prevention Plan  Transportation Screening Complete Complete Complete  Medication Review (RN Care Manager)  Complete Complete  PCP or Specialist appointment within 3-5 days of discharge  Complete Complete  HRI or Home Care Consult Complete Complete Complete  SW Recovery Care/Counseling Consult Complete Complete Complete  Palliative Care Screening  Not Applicable Not Applicable  Skilled Nursing Facility Complete Not Applicable Complete

## 2022-01-31 NOTE — Progress Notes (Addendum)
[facility] Progress Note   Subjective:   Seen in room. Had HD yesterday -minimal UF. C/o constipation today. No cp, dyspnea.   Objective Vitals:   01/31/22 0223 01/31/22 0232 01/31/22 0640 01/31/22 1000  BP: (!) 128/100 (!) 132/92 (!) 146/61 (!) 103/47  Pulse: 62 64 66 82  Resp: 11 13 16 17   Temp:  97.8 F (36.6 C) 98 F (36.7 C) 98.3 F (36.8 C)  TempSrc:  Oral Oral Oral  SpO2: 98% 100% 100% 100%  Weight:      Height:       Physical Exam General: obese female in NAD Heart: RRR, no mrg Lungs: CTAB, nml WOB Abdomen: soft, NT Extremities: b/l AKA Dialysis Access: [facility]   Filed Weights   01/28/22 2153 01/29/22 0259 01/30/22 0500  Weight: 117.9 kg 117.9 kg 117.9 kg    Intake/Output Summary (Last 24 hours) at 01/31/2022 1135 Last data filed at 01/31/2022 0232 Gross per 24 hour  Intake 220 ml  Output 2 ml  Net 218 ml     Additional Objective Labs: Basic Metabolic Panel: Recent Labs  Lab 01/27/22 1830 01/29/22 2235 01/30/22 2159  NA 134* 137 138  K 4.7 5.6* 5.5*  CL 93* 98 94*  CO2 25 25 26   GLUCOSE 111* 98 148*  BUN 46* 34* 41*  CREATININE 9.44* 8.23* 9.87*  CALCIUM 10.0 10.1 10.1  PHOS 7.3* 5.8* 6.8*    Liver Function Tests: Recent Labs  Lab 01/27/22 1830 01/29/22 2235 01/30/22 2159  ALBUMIN 2.9* 2.7* 2.7*    CBC: Recent Labs  Lab 01/24/22 1850 01/27/22 1810 01/30/22 2159  WBC 9.2 10.1 10.6*  HGB 7.5* 7.9* 7.8*  HCT 25.5* 26.3* 25.9*  MCV 100.4* 98.9 99.6  PLT 263 294 297    CBG: Recent Labs  Lab 01/25/22 0248  GLUCAP 95     Medications:  albumin human     ferric gluconate (FERRLECIT) IVPB Stopped (01/27/22 2100)    aspirin EC  81 mg Oral Daily   atorvastatin  10 mg Oral Daily   darbepoetin (ARANESP) injection - DIALYSIS  200 mcg Subcutaneous Q Fri-1800   docusate sodium  100 mg Oral BID   feeding supplement  1 Container Oral TID BM   gabapentin  100 mg Oral Q1200   gabapentin  200 mg Oral QHS   heparin  injection (subcutaneous)  5,000 Units Subcutaneous Q8H   lactulose  20 g Oral BID   levothyroxine  100 mcg Oral Q0600   midodrine  10 mg Oral Q M,W,F-HD   pantoprazole  40 mg Oral Daily   polyethylene glycol  17 g Oral Daily   sevelamer carbonate  4.8 g Oral TID WC    Dialysis Orders: MWF - [facility]  4hrs, BFR 400, DFR 600,  AVF, EDW 146kg, 3K/ 2Ca - last hep B labs 8/21 - Heparin 2500 units IV with HD - Hectorol 22mcg IV q HD, held as of 10/30/21   Assessment/Plan: ESRD- HD schedule MWF. Off schedule today due to refusing HD Wed.   HD off schedule Thurs/Sat and resume regular schedule Monday. Requesting to be scheduled after lunch, will accommodate as able. Requested patient be in chair but she is refusing today.  Hyperkalemia -  K elevated w/regular diet. Lokelma given & changed back to heart healthy diet, then changed again back to Regular diet.  HTN/volume- On midodrine 10mg  qHD, with 10mg  prn mid HD w/parameters to keep SBP>100 d/t becoming symptomatic when it drops  lower.  Significant weight loss during admission.  Remains euvolemic on exam.  Severe ischial sacral decubitus wound: Per primary/wound care team.  Limiting her, noted to be slow healing.  Goal to tolerate sitting upright - which she is doing.  see Dr Verlon Au pic.01/17/22 COPD history of hypoxia on [place] O2 at home(was on 5 L at baseline has been able to taper to 2-3 L [place]) Anemia of ESRD - Hgb 7.9 on high-dose ESA q. Monday last saturation 19% with ferritin 935. Getting iron load.  Secondary hyperparathyroidism - phos back up now on heart healthy diet. Increased Renvela to 4.8g AC TID on 10/24 . Calcium in goal. Not on VDRA, last pth 35.  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 Nutrition - diet continues to be changed from renal.  Currently on heart healthy diet.  Phos now elevated requiring ^binders. If K^, will need to change back to Renal diet and stay on renal diet.  Disposition:  Difficult situation. Prior to admit, patient required 6 staff members and 2 hoyer pads for transfer into HD chair as OP. Could not stay on HD for full tx. 2/2 pain of multiple decubitus ulcers and PTAR unable to lift pt into ambulance for transport. SW has been unable to locate stretcher dialysis location so far. Her wt is much lower from admit, asked PT about trying to hoyer to recliner for HD -> apparently would need hoyer that wraps around her legs. Tolerated 3hrs in chair so far.  Will need to tolerate 5-6hrs in a chair to tolerate OP dialysis including transport, treatment and cannulation time.  The main issue is her Rt ischial wound. PT is using a high-quality air cushion from the CIR unit. This remains to be very painful for her to sit. Per PT, she did not require 8 people to assist in hoyer transfer to chair yesterday. They were training and had 8 people in the room but she can be transferred with 2 people if needed.    Courtney Child PA-C [facility] 01/31/2022,11:36 AM   Seen and examined independently.  Agree with note and exam as documented above by physician extender and as noted here.  General adult female in chair in no acute distress, morbidly obese habitus HEENT normocephalic atraumatic extraocular movements intact sclera anicteric Neck supple trachea midline Lungs clear to auscultation bilaterally normal work of breathing at rest on 2 liters Heart S1S2 no rub Abdomen soft nontender nondistended Extremities bilateral AKA's; no edema of residual limbs appreciated  Psych normal mood and affect Access left IJ tunn catheter in place  ESRD - HD per MWF schedule usually.  HD tomorrow off schedule (Sat) then HD next week per her usual MWF schedule. Ordered midodrine to be given with HD off schedule    Sacral decubitus wound - per primary team.  Prevents her from being able to tolerate in-center HD.  try HD in a chair when she agrees/able - she had recently offered  to try then ultimately refused HD that day when in the chair  Constipation/IBS - is linzess an option?   COPD - on 2 liters oxygen here and previously much more    Disposition - appreciate SW assistance.  SW has been unable to locate stretcher dialysis location so far.  She is not able to be discharged without an accepting outpatient HD unit and without being able to tolerate HD in a chair.    Claudia Desanctis, MD 01/31/2022  3:16  PM

## 2022-02-01 LAB — RENAL FUNCTION PANEL
Albumin: 2.7 g/dL — ABNORMAL LOW (ref 3.5–5.0)
Anion gap: 11 (ref 5–15)
BUN: 22 mg/dL — ABNORMAL HIGH (ref 6–20)
CO2: 24 mmol/L (ref 22–32)
Calcium: 9.6 mg/dL (ref 8.9–10.3)
Chloride: 99 mmol/L (ref 98–111)
Creatinine, Ser: 6.82 mg/dL — ABNORMAL HIGH (ref 0.44–1.00)
GFR, Estimated: 7 mL/min — ABNORMAL LOW (ref >60)
Glucose, Bld: 178 mg/dL — ABNORMAL HIGH (ref 70–99)
Phosphorus: 5.7 mg/dL — ABNORMAL HIGH (ref 2.5–4.6)
Potassium: 4.2 mmol/L (ref 3.5–5.1)
Sodium: 134 mmol/L — ABNORMAL LOW (ref 135–145)

## 2022-02-01 LAB — PARATHYROID HORMONE, INTACT (NO CA): PTH: 60 pg/mL (ref 15–65)

## 2022-02-01 MED ORDER — HEPARIN SODIUM (PORCINE) 1000 UNIT/ML IJ SOLN
INTRAMUSCULAR | Status: AC
  Administered 2022-02-01: 2500 [IU]
  Filled 2022-02-01: qty 4

## 2022-02-01 MED ORDER — POLYETHYLENE GLYCOL 3350 17 G PO PACK
17.0000 g | PACK | Freq: Every day | ORAL | Status: DC | PRN
  Administered 2022-02-06: 17 g via ORAL
  Filled 2022-02-01 (×3): qty 1

## 2022-02-01 MED ORDER — HEPARIN SODIUM (PORCINE) 1000 UNIT/ML IJ SOLN
INTRAMUSCULAR | Status: AC
  Administered 2022-02-01: 3800 [IU]
  Filled 2022-02-01: qty 3

## 2022-02-01 NOTE — Progress Notes (Signed)
Largely remains unchanged, +BMs after lactulose yesterday -maintain colace -HD today, advised to sit in chair if possible -Dispo: remains limited, seems poorly motivated to dialyze in a chair too  Domenic Polite, MD

## 2022-02-01 NOTE — Progress Notes (Signed)
   02/01/22 1530  Pain Assessment  Pain Scale 0-10  Pain Score 0  Neurological  Level of Consciousness Alert  Orientation Level Oriented X4  Respiratory  Respiratory Pattern Regular;Unlabored  Chest Assessment Chest expansion symmetrical  Bilateral Breath Sounds Clear;Diminished  Cough None  Cardiac  Pulse Regular  Oxygen Therapy  Patient Activity (if Appropriate) In bed  O2 Device Nasal Cannula  Oxygen Therapy  O2 Flow Rate (L/min) 2 L/min  Vitals  SpO2 100 %  Pulse Rate 78  Resp 18  BP (!) 158/78   Received patient in bed to unit.  Alert and oriented.  Informed consent signed and in chart.   Treatment initiated: 1150a Treatment completed: 330p  Patient tolerated well.  Transported back to the room  Alert, without acute distress.  Hand-off given to patient's nurse.   Access used: Yes Access issues: No  Total UF removed: 1600 Medication(s) given: Midodrine 10mg , Heparin 2500 units Post HD VS: 97.8,78,158/78,18 Post HD weight: [facility]

## 2022-02-01 NOTE — Progress Notes (Addendum)
[facility] Progress Note   Subjective:   Seen in room. For dialysis today. She's irritated that we were trying to get her for dialysis before she ate. Has a lot of reasons why she's doesn't want to try chair dialysis.   Objective Vitals:   01/31/22 2153 02/01/22 0500 02/01/22 0533 02/01/22 0949  BP: (!) 150/54  (!) 141/55 138/61  Pulse: 75  71 71  Resp: 17  17 19   Temp: 98.8 F (37.1 C)  98.3 F (36.8 C) 97.8 F (36.6 C)  TempSrc: Oral     SpO2: 100%  99% 100%  Weight:  (!) 149.2 kg    Height:       Physical Exam General: obese female in NAD Heart: RRR, no mrg Lungs: CTAB, nml WOB Abdomen: soft, NT Extremities: b/l AKA Dialysis Access: [facility]   Filed Weights   01/29/22 0259 01/30/22 0500 02/01/22 0500  Weight: 117.9 kg 117.9 kg (!) 149.2 kg    Intake/Output Summary (Last 24 hours) at 02/01/2022 1124 Last data filed at 02/01/2022 0553 Gross per 24 hour  Intake --  Output 0 ml  Net 0 ml     Additional Objective Labs: Basic Metabolic Panel: Recent Labs  Lab 01/29/22 2235 01/30/22 2159 02/01/22 0207  NA 137 138 134*  K 5.6* 5.5* 4.2  CL 98 94* 99  CO2 25 26 24   GLUCOSE 98 148* 178*  BUN 34* 41* 22*  CREATININE 8.23* 9.87* 6.82*  CALCIUM 10.1 10.1 9.6  PHOS 5.8* 6.8* 5.7*    Liver Function Tests: Recent Labs  Lab 01/29/22 2235 01/30/22 2159 02/01/22 0207  ALBUMIN 2.7* 2.7* 2.7*    CBC: Recent Labs  Lab 01/27/22 1810 01/30/22 2159  WBC 10.1 10.6*  HGB 7.9* 7.8*  HCT 26.3* 25.9*  MCV 98.9 99.6  PLT 294 297    CBG: No results for input(s): "GLUCAP" in the last 168 hours.   Medications:    aspirin EC  81 mg Oral Daily   atorvastatin  10 mg Oral Daily   darbepoetin (ARANESP) injection - DIALYSIS  200 mcg Subcutaneous Q Fri-1800   docusate sodium  100 mg Oral BID   feeding supplement  1 Container Oral TID BM   gabapentin  100 mg Oral Q1200   gabapentin  200 mg Oral QHS   heparin injection (subcutaneous)  5,000 Units  Subcutaneous Q8H   levothyroxine  100 mcg Oral Q0600   midodrine  10 mg Oral Q M,W,F-HD   midodrine  10 mg Oral Once   pantoprazole  40 mg Oral Daily   sevelamer carbonate  4.8 g Oral TID WC    Dialysis Orders: MWF - [facility]  4hrs, BFR 400, DFR 600,  AVF, EDW 146kg, 3K/ 2Ca - last hep B labs 8/21 - Heparin 2500 units IV with HD - Hectorol 91mcg IV q HD, held as of 10/30/21   Assessment/Plan: ESRD- HD schedule MWF. Off schedule today due to refusing HD Wed.   HD off schedule Thurs/Sat and resume regular schedule Monday. Requesting to be scheduled after lunch, will accommodate as able.  Hyperkalemia -  K elevated w/regular diet. Lokelma given & changed back to heart healthy diet, then changed again back to Regular diet.  HTN/volume- On midodrine 10mg  qHD, with 10mg  prn mid HD w/parameters to keep SBP>100 d/t becoming symptomatic when it drops lower.  Significant weight loss during admission.  Remains euvolemic on exam.  Severe ischial sacral decubitus wound: Per primary/wound care team.  Limiting her, noted to be slow healing.  Goal to tolerate sitting upright - which she is doing.  see Dr Verlon Au pic.01/17/22 COPD history of hypoxia on [place] O2 at home(was on 5 L at baseline has been able to taper to 2-3 L [place]) Anemia of ESRD - Hgb 7.9 on high-dose ESA q. Monday last saturation 19% with ferritin 935. Getting iron load.  Secondary hyperparathyroidism - phos back up now on heart healthy diet. Increased Renvela to 4.8g AC TID on 10/24 . Calcium in goal. Not on VDRA, last pth 35.  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 Nutrition - diet continues to be changed from renal.  Currently on heart healthy diet.  Phos now elevated requiring ^binders. If K^, will need to change back to Renal diet and stay on renal diet.  Disposition: Difficult situation. Prior to admit, patient required 6 staff members and 2 hoyer pads for transfer into HD chair as OP. Could not stay on  HD for full tx. 2/2 pain of multiple decubitus ulcers and PTAR unable to lift pt into ambulance for transport. SW has been unable to locate stretcher dialysis location so far. Her wt is much lower from admit, asked PT about trying to hoyer to recliner for HD -> apparently would need hoyer that wraps around her legs. Tolerated 3hrs in chair so far.  Will need to tolerate 5-6hrs in a chair to tolerate OP dialysis including transport, treatment and cannulation time.  The main issue is her Rt ischial wound. PT is using a high-quality air cushion from the CIR unit. This remains to be very painful for her to sit.   [name] PA-C [facility] 02/01/2022,11:24 AM   Seen and examined independently.  Agree with note and exam as documented above by physician extender and as noted here.  Had a DM yesterday after multiple PRN's.   Seen and examined on dialysis.  Blood pressure 172/64 and HR 68.  Tolerating goal.  Procedure supervised.  Tunn catheter in use  Claudia Desanctis, MD 02/01/2022 3:00 PM '   Claudia Desanctis, MD 02/01/2022  2:59 PM

## 2022-02-02 DIAGNOSIS — N186 End stage renal disease: Secondary | ICD-10-CM | POA: Diagnosis not present

## 2022-02-02 MED ORDER — ENOXAPARIN SODIUM 30 MG/0.3ML IJ SOSY
30.0000 mg | PREFILLED_SYRINGE | INTRAMUSCULAR | Status: DC
  Administered 2022-02-02 – 2022-02-05 (×4): 30 mg via SUBCUTANEOUS
  Filled 2022-02-02 (×5): qty 0.3

## 2022-02-02 MED ORDER — LORATADINE 10 MG PO TABS
10.0000 mg | ORAL_TABLET | Freq: Every day | ORAL | Status: DC
  Administered 2022-02-02 – 2022-03-23 (×19): 10 mg via ORAL
  Filled 2022-02-02 (×43): qty 1

## 2022-02-02 MED ORDER — TRAZODONE HCL 50 MG PO TABS
50.0000 mg | ORAL_TABLET | Freq: Every day | ORAL | Status: DC
  Administered 2022-02-02 – 2022-02-07 (×3): 50 mg via ORAL
  Filled 2022-02-02 (×5): qty 1

## 2022-02-02 NOTE — Progress Notes (Addendum)
Patient complains that she is having difficulty swallowing and that her food is getting stuck.She complained of this last night and again this morning after attempting to eat a sandwich. She stated she can drink fluids, but did  endorse last night that her juice came back up. RN will hold heparin for now this morning. Patient's abdomen is covered with large hard knot that are tender and warm to touch from several heparin injections.  I offered to try arm or thighs and patient declined, but would like for attending to assess her abdomen.

## 2022-02-02 NOTE — Progress Notes (Addendum)
[facility] Progress Note   Subjective:  Ended treatment early yesterday d/t dialyzer clotted. Didn't want to restart. Had some bleeding from cathter site - better today, no new bleed.   Objective Vitals:   02/01/22 1729 02/01/22 2300 02/02/22 0644 02/02/22 0900  BP: (!) 158/73 (!) 161/75 (!) 129/57 119/84  Pulse: 60 72  72  Resp: 16 20 18 18   Temp: 98.3 F (36.8 C) 98.7 F (37.1 C) 98.8 F (37.1 C) 98.4 F (36.9 C)  TempSrc:  Axillary Oral Oral  SpO2:  100% 100%   Weight:      Height:       Physical Exam General: obese female in NAD Heart: RRR, no mrg Lungs: CTAB, nml WOB Abdomen: soft, NT Extremities: b/l AKA Dialysis Access: TDC c,d, I   Filed Weights   01/29/22 0259 01/30/22 0500 02/01/22 0500  Weight: 117.9 kg 117.9 kg (!) 149.2 kg    Intake/Output Summary (Last 24 hours) at 02/02/2022 1113 Last data filed at 02/01/2022 2030 Gross per 24 hour  Intake 360 ml  Output 1600 ml  Net -1240 ml     Additional Objective Labs: Basic Metabolic Panel: Recent Labs  Lab 01/29/22 2235 01/30/22 2159 02/01/22 0207  NA 137 138 134*  K 5.6* 5.5* 4.2  CL 98 94* 99  CO2 25 26 24   GLUCOSE 98 148* 178*  BUN 34* 41* 22*  CREATININE 8.23* 9.87* 6.82*  CALCIUM 10.1 10.1 9.6  PHOS 5.8* 6.8* 5.7*    Liver Function Tests: Recent Labs  Lab 01/29/22 2235 01/30/22 2159 02/01/22 0207  ALBUMIN 2.7* 2.7* 2.7*    CBC: Recent Labs  Lab 01/27/22 1810 01/30/22 2159  WBC 10.1 10.6*  HGB 7.9* 7.8*  HCT 26.3* 25.9*  MCV 98.9 99.6  PLT 294 297    CBG: No results for input(s): "GLUCAP" in the last 168 hours.   Medications:    aspirin EC  81 mg Oral Daily   atorvastatin  10 mg Oral Daily   darbepoetin (ARANESP) injection - DIALYSIS  200 mcg Subcutaneous Q Fri-1800   docusate sodium  100 mg Oral BID   enoxaparin (LOVENOX) injection  30 mg Subcutaneous Q24H   feeding supplement  1 Container Oral TID BM   gabapentin  100 mg Oral Q1200    gabapentin  200 mg Oral QHS   levothyroxine  100 mcg Oral Q0600   loratadine  10 mg Oral Daily   midodrine  10 mg Oral Q M,W,F-HD   pantoprazole  40 mg Oral Daily   sevelamer carbonate  4.8 g Oral TID WC   traZODone  50 mg Oral QHS    Dialysis Orders: MWF - [facility]  4hrs, BFR 400, DFR 600,  AVF, EDW 146kg, 3K/ 2Ca - last hep B labs 8/21 - Heparin 2500 units IV with HD - Hectorol 58mcg IV q HD, held as of 10/30/21   Assessment/Plan: ESRD- HD schedule MWF. Back on schedule. Next HD 10/30.  Requesting to be scheduled after lunch, will accommodate as able.  Hyperkalemia -  K elevated w/regular diet. Lokelma given & changed back to heart healthy diet, then changed again back to Regular diet.  HTN/volume- On midodrine 10mg  qHD, with 10mg  prn mid HD w/parameters to keep SBP>100 d/t becoming symptomatic when it drops lower.  Significant weight loss during admission.  Remains euvolemic on exam.  Severe ischial sacral decubitus wound: Per primary/wound care team.  Limiting her, noted to be slow healing.  Goal  to tolerate sitting upright - which she is doing.  see Dr Verlon Au pic.01/17/22 COPD history of hypoxia on [place] O2 at home(was on 5 L at baseline has been able to taper to 2-3 L [place]) Anemia of ESRD - Hgb 7.9 on high-dose ESA q. Monday last saturation 19% with ferritin 935. Getting iron load.  Secondary hyperparathyroidism - phos back up now on heart healthy diet. Increased Renvela to 4.8g AC TID on 10/24 . Calcium in goal. Not on VDRA, last pth 35.  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 Nutrition - diet continues to be changed from renal.  Currently on heart healthy diet.  Phos now elevated requiring ^binders. If K^, will need to change back to Renal diet and stay on renal diet.  Disposition: Difficult situation. Prior to admit, patient required 6 staff members and 2 hoyer pads for transfer into HD chair as OP. Could not stay on HD for full tx. 2/2 pain of  multiple decubitus ulcers and PTAR unable to lift pt into ambulance for transport. SW has been unable to locate stretcher dialysis location so far. Her wt is much lower from admit, asked PT about trying to hoyer to recliner for HD -> apparently would need hoyer that wraps around her legs. Tolerated 3hrs in chair so far.  Will need to tolerate 5-6hrs in a chair to tolerate OP dialysis including transport, treatment and cannulation time.  The main issue is her Rt ischial wound. PT is using a high-quality air cushion from the CIR unit. This remains to be very painful for her to sit.   [name] PA-C [facility] 02/02/2022,11:13 AM   Seen and examined independently.  Agree with note and exam as documented above by physician extender and as noted here.   General adult female in chair in no acute distress, morbidly obese habitus HEENT normocephalic atraumatic extraocular movements intact sclera anicteric Neck supple trachea midline Lungs clear to auscultation bilaterally normal work of breathing at rest on 2 liters Heart S1S2 no rub Abdomen soft nontender nondistended Extremities bilateral AKA's; no edema of residual limbs appreciated  Psych normal mood and affect Access left IJ tunn catheter in place  ESRD - back on MWF schedule for HD. Renal panel in AM     Sacral decubitus wound - per primary team.  Prevents her from being able to tolerate in-center HD.     Constipation/IBS - constipation improved transiently with PRN's; is linzess an option?    COPD - on 2 liters oxygen here and previously much more; decreased resp reserve    Disposition -  From my standpoint, she is appropriate for an LTAC at this time.  She has a wound which precludes her being able to tolerate a chair for HD and she also is concerned about her IBS acting up.    appreciate SW assistance.  SW had been looking for stretcher dialysis location and they have not found one so far.  She is not able  to be discharged without an accepting outpatient HD unit and cannot go to an outpatient HD unit without being able to tolerate HD in a chair.    Claudia Desanctis, MD 02/02/2022 12:23 PM

## 2022-02-02 NOTE — Progress Notes (Addendum)
PROGRESS NOTE    Courtney Gray  QMV:[id number] DOB: 1970/10/22 DOA: 11/01/2021 PCP: Monico Blitz, MD  Chronically ill obese bedbound with ESRD, peripheral vascular disease with right AKA, morbid obesity chronic respiratory failure on home O2 and sacral decubitus wounds, recent lengthy hospitalizations with bacteremia, hepatic and splenic abscess requiring amputation of third digit of left hand and left AKA, now bilateral AKA, splenic drain, liver abscess drain, sacral decubitus wound was admitted with progressive dyspnea on exertion in the setting of missed hemodialysis, per outpatient HD records it was taking 4 staff members and a [place] lift for patient to be seated in a dialysis chair. -Admitted, symptoms improved after hemodialysis -Now awaiting placement to dialysis center where she can get treatment on a stretcher   Subjective: -Bothered by knots in her abdominal subcu tissue from heparin shots, refusing 3 times daily heparin, willing to try daily Lovenox  Assessment and Plan:  Volume overload End stage renal disease ([place]) -See discussion above,  -Has not been successful in chair dialysis, limited by deep decubitus wound -TOC team following, to assist with placement and dialysis center where she can dialyze on a stretcher -Complicated by large sacral decubitus wound and bilateral AKA  Anemia of chronic kidney disease, Hgb has been stable.  Continue with EPO and Iron supplementation.  -Check CBC in a.m.  Hyperkalemia, hypomagnesemia, hypokalemia, hyponatremia, hypophosphatemia -corrected with renal replacement therapy.   Hypertension -On midodrine with HD Currently off antihypertensive medications.   Decubitus ulcer of sacral region, stage 3 (HCC) Deep wound at the sacrum, patient not able to sit  Continue with oral oxycodone, Continue with local wound care.  Type 2 diabetes mellitus with hyperlipidemia ([place]) -Stable  Bacteremia Patient has completed antibiotic  therapy. Bacteroides, splenic abscess.   PVD (peripheral vascular disease) (HCC) Bilateral AKA.   COPD -Stable, on home O2 2 L  Class 3 obesity (HCC) Calculated BMI is 65,3   Acute on chronic diastolic CHF (congestive heart failure) (HCC) Volume regulation with ultrafiltration in hemodialysis Continue midodrine for blood pressure support.   DVT prophylaxis: Heparin subcutaneous Code Status: Full code Family Communication: No family at bedside Disposition Plan: To be determined  Consultants:    Procedures:   Antimicrobials:    Objective: Vitals:   02/01/22 1729 02/01/22 2300 02/02/22 0644 02/02/22 0900  BP: (!) 158/73 (!) 161/75 (!) 129/57 119/84  Pulse: 60 72  72  Resp: 16 20 18 18   Temp: 98.3 F (36.8 C) 98.7 F (37.1 C) 98.8 F (37.1 C) 98.4 F (36.9 C)  TempSrc:  Axillary Oral Oral  SpO2:  100% 100%   Weight:      Height:        Intake/Output Summary (Last 24 hours) at 02/02/2022 1015 Last data filed at 02/01/2022 2030 Gross per 24 hour  Intake 360 ml  Output 1600 ml  Net -1240 ml   Filed Weights   01/29/22 0259 01/30/22 0500 02/01/22 0500  Weight: 117.9 kg 117.9 kg (!) 149.2 kg    Examination:  General exam: Obese chronically ill female sitting up in bed, AAOx3, no distress HEENT: Neck obese unable to assess JVD CVS: S1-S2, regular rhythm Abdomen: Soft, obese, large abdominal pannus with some acute nodular structures consistent with heparin shots  Central nervous system: Alert and oriented. No focal neurological deficits. Extremities: Lateral AKA Skin: Sacral decubitus wound Psychiatry:  Mood & affect appropriate.     Data Reviewed:   CBC: Recent Labs  Lab 01/27/22 1810 01/30/22 2159  WBC 10.1  10.6*  HGB 7.9* 7.8*  HCT 26.3* 25.9*  MCV 98.9 99.6  PLT 294 161   Basic Metabolic Panel: Recent Labs  Lab 01/27/22 1830 01/29/22 2235 01/30/22 2159 02/01/22 0207  NA 134* 137 138 134*  K 4.7 5.6* 5.5* 4.2  CL 93* 98 94* 99  CO2  25 25 26 24   GLUCOSE 111* 98 148* 178*  BUN 46* 34* 41* 22*  CREATININE 9.44* 8.23* 9.87* 6.82*  CALCIUM 10.0 10.1 10.1 9.6  PHOS 7.3* 5.8* 6.8* 5.7*   GFR: Estimated Creatinine Clearance: 14.5 mL/min (A) (by C-G formula based on SCr of 6.82 mg/dL (H)). Liver Function Tests: Recent Labs  Lab 01/27/22 1830 01/29/22 2235 01/30/22 2159 02/01/22 0207  ALBUMIN 2.9* 2.7* 2.7* 2.7*   No results for input(s): "LIPASE", "AMYLASE" in the last 168 hours. No results for input(s): "AMMONIA" in the last 168 hours. Coagulation Profile: No results for input(s): "INR", "PROTIME" in the last 168 hours. Cardiac Enzymes: No results for input(s): "CKTOTAL", "CKMB", "CKMBINDEX", "TROPONINI" in the last 168 hours. BNP (last 3 results) No results for input(s): "PROBNP" in the last 8760 hours. HbA1C: No results for input(s): "HGBA1C" in the last 72 hours. CBG: No results for input(s): "GLUCAP" in the last 168 hours.  Lipid Profile: No results for input(s): "CHOL", "HDL", "LDLCALC", "TRIG", "CHOLHDL", "LDLDIRECT" in the last 72 hours. Thyroid Function Tests: No results for input(s): "TSH", "T4TOTAL", "FREET4", "T3FREE", "THYROIDAB" in the last 72 hours. Anemia Panel: No results for input(s): "VITAMINB12", "FOLATE", "FERRITIN", "TIBC", "IRON", "RETICCTPCT" in the last 72 hours. Urine analysis: No results found for: "COLORURINE", "APPEARANCEUR", "LABSPEC", "PHURINE", "GLUCOSEU", "HGBUR", "BILIRUBINUR", "KETONESUR", "PROTEINUR", "UROBILINOGEN", "NITRITE", "LEUKOCYTESUR" Sepsis Labs: @LABRCNTIP (procalcitonin:4,lacticidven:4)  )No results found for this or any previous visit (from the past 240 hour(s)).   Radiology Studies: No results found.   Scheduled Meds:  aspirin EC  81 mg Oral Daily   atorvastatin  10 mg Oral Daily   darbepoetin (ARANESP) injection - DIALYSIS  200 mcg Subcutaneous Q Fri-1800   docusate sodium  100 mg Oral BID   enoxaparin (LOVENOX) injection  30 mg Subcutaneous Q24H    feeding supplement  1 Container Oral TID BM   gabapentin  100 mg Oral Q1200   gabapentin  200 mg Oral QHS   levothyroxine  100 mcg Oral Q0600   midodrine  10 mg Oral Q M,W,F-HD   pantoprazole  40 mg Oral Daily   sevelamer carbonate  4.8 g Oral TID WC   Continuous Infusions:     LOS: 93 days    Time spent: 31min    Domenic Polite, MD [facility]   02/02/2022, 10:15 AM

## 2022-02-03 LAB — HEPATITIS B SURFACE ANTIGEN

## 2022-02-03 LAB — HEPATITIS B CORE ANTIBODY, TOTAL

## 2022-02-03 NOTE — Progress Notes (Signed)
Patient seen and examined, no new issues, had BMs yesterday -Per TOC she is not LTAC appropriate -Dispo continues to be challenging until she can tolerate HD in a chair which is limited by bilateral BKA and sacral wound  Domenic Polite, MD

## 2022-02-03 NOTE — Progress Notes (Signed)
Was called by HD RN-  there was some kind of pulling to her [facility] and now it is not functional-  it is not pushing or pulling-  it most likely needs to be replaced.  Will make pt npo and put in order for IR to exchange [facility] for tomorrow   Louis Meckel

## 2022-02-03 NOTE — Progress Notes (Addendum)
[facility] Progress Note   Subjective:   Pt seen in bed, resting at arrival. Reported nausea this am but no vomiting. PT aggressively working on mobility issues. Appreciate their assistance. HD later today.   Objective Vitals:   02/02/22 1736 02/02/22 2119 02/03/22 0558 02/03/22 0948  BP: 118/64 (!) 125/48 (!) 112/50 (!) 100/34  Pulse: 78 80 84   Resp:  18 18   Temp: 98.3 F (36.8 C) 98.5 F (36.9 C) 99.4 F (37.4 C) 98.7 F (37.1 C)  TempSrc: Oral Oral Oral Oral  SpO2: 100% 100% 100%   Weight:      Height:       Physical Exam General: NAD. Resting at arrival but wakes to verbal stimuli. Heart: S1/S2 no M/R/G noted Lungs: lying flat with no orthopnea noted Abdomen: Soft/non-tender with BS+  Extremities: Bilateral AKA. No stump edema.  Dialysis Access: TDC. Drsg intact  Additional Objective Labs: Basic Metabolic Panel: Recent Labs  Lab 01/29/22 2235 01/30/22 2159 02/01/22 0207  NA 137 138 134*  K 5.6* 5.5* 4.2  CL 98 94* 99  CO2 25 26 24   GLUCOSE 98 148* 178*  BUN 34* 41* 22*  CREATININE 8.23* 9.87* 6.82*  CALCIUM 10.1 10.1 9.6  PHOS 5.8* 6.8* 5.7*   Liver Function Tests: Recent Labs  Lab 01/29/22 2235 01/30/22 2159 02/01/22 0207  ALBUMIN 2.7* 2.7* 2.7*   No results for input(s): "LIPASE", "AMYLASE" in the last 168 hours. CBC: Recent Labs  Lab 01/27/22 1810 01/30/22 2159  WBC 10.1 10.6*  HGB 7.9* 7.8*  HCT 26.3* 25.9*  MCV 98.9 99.6  PLT 294 297   Blood Culture    Component Value Date/Time   SDES BLOOD LEFT ANTECUBITAL 10/16/2021 1534   SPECREQUEST  10/16/2021 1534    BOTTLES DRAWN AEROBIC AND ANAEROBIC Blood Culture results may not be optimal due to an inadequate volume of blood received in culture bottles   CULT  10/16/2021 1534    NO GROWTH 5 DAYS Performed at [facility], [place] [address], [place], [place] [address]    REPTSTATUS 10/21/2021 FINAL 10/16/2021 1534    Cardiac Enzymes: No results for input(s):  "CKTOTAL", "CKMB", "CKMBINDEX", "TROPONINI" in the last 168 hours. CBG: No results for input(s): "GLUCAP" in the last 168 hours. Iron Studies: No results for input(s): "IRON", "TIBC", "TRANSFERRIN", "FERRITIN" in the last 72 hours. @lablastinr3 @ Studies/Results: No results found. Medications:   aspirin EC  81 mg Oral Daily   atorvastatin  10 mg Oral Daily   darbepoetin (ARANESP) injection - DIALYSIS  200 mcg Subcutaneous Q Fri-1800   docusate sodium  100 mg Oral BID   enoxaparin (LOVENOX) injection  30 mg Subcutaneous Q24H   feeding supplement  1 Container Oral TID BM   gabapentin  100 mg Oral Q1200   gabapentin  200 mg Oral QHS   levothyroxine  100 mcg Oral Q0600   loratadine  10 mg Oral Daily   midodrine  10 mg Oral Q M,W,F-HD   pantoprazole  40 mg Oral Daily   sevelamer carbonate  4.8 g Oral TID WC   traZODone  50 mg Oral QHS     Assessment/Plan: Inability to obtain HD at OP center D/T body habitus. Difficult situation with no clear answers yet. PT working with pt.  ESRD- HD schedule MWF. Back on schedule. HD later today. Hyperkalemia -  K4.2 since Lokelma given. HTN/volume- On midodrine 10mg  qHD, with 10mg  prn mid HD w/parameters to keep SBP>100 d/t  becoming symptomatic when it drops lower.  Significant weight loss during admission.  Remains euvolemic on exam.  Severe ischial sacral decubitus wound: Per primary/wound care team.  Limiting her, noted to be slow healing.  Goal to tolerate sitting upright - which she is doing.  see Dr Verlon Au pic.01/17/22 COPD history of hypoxia on [place] O2 at home(was on 5 L at baseline has been able to taper to 2-3 L [place]).  Anemia of ESRD - Hgb 7.8 on high-dose ESA q weekly. Recent Fe load completed.  Secondary hyperparathyroidism - phos back to 5.7. Renvela 4.8g AC TID started 10/24 . Calcium at goal. Not on VDRA, pth 60 01/30/22.  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 Nutrition - diet continues to be changed from renal.   Currently on heart healthy diet.  Stay on renal diet.  Disposition: Difficult situation. Prior to admit, patient required 6 staff members and 2 hoyer pads for transfer into HD chair as OP. Could not stay on HD for full tx. 2/2 pain of multiple decubitus ulcers and PTAR unable to lift pt into ambulance for transport.SW working on LTC at [employer] and PT working to get pt up to bariatric chair. Her wt is much lower from admit. Has tolerated 3hrs in chair so far.  Will need to tolerate 5-6hrs in a chair to tolerate OP dialysis including transport, treatment and cannulation time.  The main issue is her Rt ischial wound. PT is using a high-quality air cushion from the CIR unit. This remains to be very painful for her to sit.   Yoandri Congrove H. Ronniesha Seibold NP-C 02/03/2022, 12:18 PM  [employer] [phone]

## 2022-02-03 NOTE — TOC Progression Note (Signed)
Transition of Care [facility]) - Progression Note    Patient Details  Name: Courtney Gray MRN: [id number] Date of Birth: 1970-07-01  [facility] CM/SW Contact  Bartholomew Crews, RN Phone Number: [phone] 02/03/2022, 8:11 AM  Clinical Narrative:     Acknowledging [facility] consult for LTAC appropriateness. Patient lacks necessary ICU days to meet criteria for LTAC. LTAC is not long term solution and patient needs to demonstrate ability to improve and transition out of LTAC setting. TOC continuing to follow for transition needs.   Expected Discharge Plan: Long Term Nursing Home Barriers to Discharge: No SNF bed, Transportation, Waiting for outpatient dialysis  Expected Discharge Plan and Services Expected Discharge Plan: [place]       Living arrangements for the past 2 months: Single Family Home                                       Social Determinants of Health (SDOH) Interventions    Readmission Risk Interventions    09/16/2021    4:36 PM 08/28/2021   11:50 AM 07/12/2021   12:38 PM  Readmission Risk Prevention Plan  Transportation Screening Complete Complete Complete  Medication Review [employer])  Complete Complete  PCP or Specialist appointment within 3-5 days of discharge  Complete Complete  HRI or Home Care Consult Complete Complete Complete  SW Recovery Care/Counseling Consult Complete Complete Complete  Palliative Care Screening  Not Applicable Not Applicable  Skilled Nursing Facility Complete Not Applicable Complete

## 2022-02-03 NOTE — Progress Notes (Signed)
HD tx not initiated as scheduled due to HD catheter not being functional. MD made aware.

## 2022-02-03 NOTE — Progress Notes (Addendum)
Attempted to bring patent to HD, patient states "I just woke up". Patient is not ready to come up. Will notify NP, Owens Shark.

## 2022-02-03 NOTE — Plan of Care (Signed)
  Problem: Education: Goal: Knowledge of General Education information will improve Description Including pain rating scale, medication(s)/side effects and non-pharmacologic comfort measures Outcome: Progressing   Problem: Health Behavior/Discharge Planning: Goal: Ability to manage health-related needs will improve Outcome: Progressing   

## 2022-02-04 ENCOUNTER — Inpatient Hospital Stay (HOSPITAL_COMMUNITY): Payer: 59

## 2022-02-04 DIAGNOSIS — N186 End stage renal disease: Secondary | ICD-10-CM | POA: Diagnosis not present

## 2022-02-04 HISTORY — PX: IR FLUORO GUIDE CV LINE LEFT: IMG2282

## 2022-02-04 LAB — RENAL FUNCTION PANEL
Albumin: 2.7 g/dL — ABNORMAL LOW (ref 3.5–5.0)
Albumin: 2.7 g/dL — ABNORMAL LOW (ref 3.5–5.0)
Anion gap: 12 (ref 5–15)
Anion gap: 15 (ref 5–15)
BUN: 38 mg/dL — ABNORMAL HIGH (ref 6–20)
BUN: 45 mg/dL — ABNORMAL HIGH (ref 6–20)
CO2: 23 mmol/L (ref 22–32)
CO2: 26 mmol/L (ref 22–32)
Calcium: 10.1 mg/dL (ref 8.9–10.3)
Calcium: 9.4 mg/dL (ref 8.9–10.3)
Chloride: 101 mmol/L (ref 98–111)
Chloride: 98 mmol/L (ref 98–111)
Creatinine, Ser: 10 mg/dL — ABNORMAL HIGH (ref 0.44–1.00)
Creatinine, Ser: 8.84 mg/dL — ABNORMAL HIGH (ref 0.44–1.00)
GFR, Estimated: 4 mL/min — ABNORMAL LOW (ref >60)
GFR, Estimated: 5 mL/min — ABNORMAL LOW (ref >60)
Glucose, Bld: 125 mg/dL — ABNORMAL HIGH (ref 70–99)
Glucose, Bld: 89 mg/dL (ref 70–99)
Phosphorus: 5.8 mg/dL — ABNORMAL HIGH (ref 2.5–4.6)
Phosphorus: 6.3 mg/dL — ABNORMAL HIGH (ref 2.5–4.6)
Potassium: 4.4 mmol/L (ref 3.5–5.1)
Potassium: 6 mmol/L — ABNORMAL HIGH (ref 3.5–5.1)
Sodium: 136 mmol/L (ref 135–145)
Sodium: 139 mmol/L (ref 135–145)

## 2022-02-04 LAB — CBC
HCT: 26.3 % — ABNORMAL LOW (ref 36.0–46.0)
Hemoglobin: 7.9 g/dL — ABNORMAL LOW (ref 12.0–15.0)
MCH: 30.5 pg (ref 26.0–34.0)
MCHC: 30 g/dL (ref 30.0–36.0)
MCV: 101.5 fL — ABNORMAL HIGH (ref 80.0–100.0)
Platelets: 210 10*3/uL (ref 150–400)
RBC: 2.59 MIL/uL — ABNORMAL LOW (ref 3.87–5.11)
RDW: 15.4 % (ref 11.5–15.5)
WBC: 11.2 10*3/uL — ABNORMAL HIGH (ref 4.0–10.5)
nRBC: 0 % (ref 0.0–0.2)

## 2022-02-04 MED ORDER — HEPARIN SODIUM (PORCINE) 1000 UNIT/ML IJ SOLN
4200.0000 [IU] | Freq: Once | INTRAMUSCULAR | Status: AC
  Administered 2022-02-05: 4200 [IU]
  Filled 2022-02-04: qty 4.2

## 2022-02-04 MED ORDER — SODIUM ZIRCONIUM CYCLOSILICATE 10 G PO PACK
10.0000 g | PACK | Freq: Once | ORAL | Status: DC
  Filled 2022-02-04: qty 1

## 2022-02-04 MED ORDER — VANCOMYCIN HCL IN DEXTROSE 1-5 GM/200ML-% IV SOLN
INTRAVENOUS | Status: AC
  Administered 2022-02-04: 1000 mg
  Filled 2022-02-04: qty 200

## 2022-02-04 MED ORDER — LIDOCAINE HCL 1 % IJ SOLN
INTRAMUSCULAR | Status: AC
  Administered 2022-02-04: 10 mL via INTRADERMAL
  Filled 2022-02-04: qty 20

## 2022-02-04 MED ORDER — MIDAZOLAM HCL 2 MG/2ML IJ SOLN
INTRAMUSCULAR | Status: AC | PRN
  Administered 2022-02-04 (×2): .5 mg via INTRAVENOUS

## 2022-02-04 MED ORDER — HEPARIN SODIUM (PORCINE) 1000 UNIT/ML IJ SOLN
INTRAMUSCULAR | Status: AC
  Filled 2022-02-04: qty 5

## 2022-02-04 MED ORDER — MIDAZOLAM HCL 2 MG/2ML IJ SOLN
INTRAMUSCULAR | Status: AC
  Filled 2022-02-04: qty 2

## 2022-02-04 MED ORDER — HEPARIN SODIUM (PORCINE) 1000 UNIT/ML IJ SOLN
2000.0000 [IU] | Freq: Once | INTRAMUSCULAR | Status: AC
  Administered 2022-02-04: 2000 [IU] via INTRAVENOUS

## 2022-02-04 MED ORDER — LIDOCAINE HCL 1 % IJ SOLN
10.0000 mL | Freq: Once | INTRAMUSCULAR | Status: AC
  Filled 2022-02-04: qty 10

## 2022-02-04 MED ORDER — HEPARIN SODIUM (PORCINE) 1000 UNIT/ML IJ SOLN
INTRAMUSCULAR | Status: AC
  Filled 2022-02-04: qty 2

## 2022-02-04 MED ORDER — FENTANYL CITRATE (PF) 100 MCG/2ML IJ SOLN
INTRAMUSCULAR | Status: AC
  Filled 2022-02-04: qty 2

## 2022-02-04 MED ORDER — HEPARIN SODIUM (PORCINE) 1000 UNIT/ML IJ SOLN
INTRAMUSCULAR | Status: AC
  Administered 2022-02-04: 4200 [IU]
  Filled 2022-02-04: qty 10

## 2022-02-04 NOTE — Progress Notes (Addendum)
[facility] Progress Note   Subjective: Seen in room. NPO for [facility] placement. HD post line placement.     Objective Vitals:   02/03/22 2029 02/03/22 2217 02/04/22 0522 02/04/22 0917  BP: (!) 146/46 (!) 142/48 (!) 118/56 (!) 122/56  Pulse: 74 71 70 71  Resp: (!) 24 18 18    Temp: 97.9 F (36.6 C) 98.6 F (37 C) 98.5 F (36.9 C) 98.2 F (36.8 C)  TempSrc: Oral Oral Oral Oral  SpO2: 100% 100% 100% 100%  Weight:      Height:       Physical Exam General:Morbidly obese female in NAD. Heart: S1/S2 no M/R/G noted Lungs: lying flat with no orthopnea noted Abdomen: Soft/non-tender with BS+  Extremities: Bilateral AKA. No stump edema.  Dialysis Access: TDC. Drsg intact   Additional Objective Labs: Basic Metabolic Panel: Recent Labs  Lab 02/01/22 0207 02/04/22 0009 02/04/22 0428  NA 134* 136 139  K 4.2 4.4 6.0*  CL 99 98 101  CO2 24 26 23   GLUCOSE 178* 89 125*  BUN 22* 38* 45*  CREATININE 6.82* 8.84* 10.00*  CALCIUM 9.6 9.4 10.1  PHOS 5.7* 5.8* 6.3*   Liver Function Tests: Recent Labs  Lab 02/01/22 0207 02/04/22 0009 02/04/22 0428  ALBUMIN 2.7* 2.7* 2.7*   No results for input(s): "LIPASE", "AMYLASE" in the last 168 hours. CBC: Recent Labs  Lab 01/30/22 2159 02/04/22 0010  WBC 10.6* 11.2*  HGB 7.8* 7.9*  HCT 25.9* 26.3*  MCV 99.6 101.5*  PLT 297 210   Blood Culture    Component Value Date/Time   SDES BLOOD LEFT ANTECUBITAL 10/16/2021 1534   SPECREQUEST  10/16/2021 1534    BOTTLES DRAWN AEROBIC AND ANAEROBIC Blood Culture results may not be optimal due to an inadequate volume of blood received in culture bottles   CULT  10/16/2021 1534    NO GROWTH 5 DAYS Performed at [facility], [place] [address], [place], [place] [address]    REPTSTATUS 10/21/2021 FINAL 10/16/2021 1534    Cardiac Enzymes: No results for input(s): "CKTOTAL", "CKMB", "CKMBINDEX", "TROPONINI" in the last 168 hours. CBG: No results for input(s): "GLUCAP" in the  last 168 hours. Iron Studies: No results for input(s): "IRON", "TIBC", "TRANSFERRIN", "FERRITIN" in the last 72 hours. @lablastinr3 @ Studies/Results: No results found. Medications:   aspirin EC  81 mg Oral Daily   atorvastatin  10 mg Oral Daily   darbepoetin (ARANESP) injection - DIALYSIS  200 mcg Subcutaneous Q Fri-1800   docusate sodium  100 mg Oral BID   enoxaparin (LOVENOX) injection  30 mg Subcutaneous Q24H   feeding supplement  1 Container Oral TID BM   gabapentin  100 mg Oral Q1200   gabapentin  200 mg Oral QHS   levothyroxine  100 mcg Oral Q0600   loratadine  10 mg Oral Daily   midodrine  10 mg Oral Q M,W,F-HD   pantoprazole  40 mg Oral Daily   sevelamer carbonate  4.8 g Oral TID WC   traZODone  50 mg Oral QHS     Assessment/Plan: Inability to obtain HD at OP center D/T body habitus. Difficult situation with no clear answers yet. PT working with pt.  ESRD- HD schedule MWF. Back on schedule. HD later today. Hyperkalemia -  K 6.0 after missing HD 02/03/2022. Repeat Lokelma 10 grams po X 1. HD later today.  HTN/volume- On midodrine 10mg  qHD, with 10mg  prn mid HD w/parameters to keep SBP>100 d/t becoming symptomatic when it  drops lower.  Significant weight loss during admission.  Remains euvolemic on exam.  Severe ischial sacral decubitus wound: Per primary/wound care team.  Limiting her, noted to be slow healing.  Goal to tolerate sitting upright - which she is doing.  see Dr Verlon Au pic.01/17/22 COPD history of hypoxia on [place] O2 at home(was on 5 L at baseline has been able to taper to 2-3 L [place]).  Anemia of ESRD - Hgb 7.8 on high-dose ESA q weekly. Recent Fe load completed.  Secondary hyperparathyroidism - phos back to 5.7. Renvela 4.8g AC TID started 10/24 . Calcium at goal. Not on VDRA, pth 60 01/30/22.  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 Nutrition - diet continues to be changed from renal.  Currently on heart healthy diet.  Stay on renal diet.   Disposition: Difficult situation. Prior to admit, patient required 6 staff members and 2 hoyer pads for transfer into HD chair as OP. Could not stay on HD for full tx. 2/2 pain of multiple decubitus ulcers and PTAR unable to lift pt into ambulance for transport.SW working on LTC at [employer] and PT working to get pt up to bariatric chair. Her wt is much lower from admit. Has tolerated 3hrs in chair so far.  Will need to tolerate 5-6hrs in a chair to tolerate OP dialysis including transport, treatment and cannulation time.  The main issue is her Rt ischial wound. PT is using a high-quality air cushion from the CIR unit. This remains to be very painful for her to sit.   Hart Haas H. Landon Truax NP-C 02/04/2022, 11:56 AM  [employer] [phone]

## 2022-02-04 NOTE — Progress Notes (Signed)
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 -Admitted, symptoms improved after hemodialysis -Now awaiting placement to dialysis center where she can get treatment on a stretcher, LTAC denied   Subjective: -HD catheter malfunction yesterday, getting new tunneled catheter today -Continues to have some discomfort at the site of subcu nodules from heparin injections -BM yesterday  Assessment and Plan:  Volume overload End stage renal disease ([place]) -See discussion above,  -Has not been successful in chair dialysis, limited by deep decubitus wound -TOC team following, to assist with placement and dialysis center where she can dialyze on a stretcher -Complicated by large sacral decubitus wound and bilateral AKA  Anemia of chronic kidney disease, Hgb has been stable.  Continue with EPO and Iron supplementation.   Hyperkalemia, hypomagnesemia, hypokalemia, hyponatremia, hypophosphatemia -corrected with renal replacement therapy.   Hypertension -On midodrine with HD Currently off antihypertensive medications.   Decubitus ulcer of sacral region, stage 3 (HCC) Deep wound at the sacrum, patient not able to sit  Continue with oral oxycodone, Continue with local wound care.  History of IBS -Intermittent constipation, resolved after  lactulose -Diarrhea yesterday, hold further laxatives today  Type 2 diabetes mellitus with hyperlipidemia ([place]) -Stable  Bacteremia Patient has completed antibiotic therapy. Bacteroides, splenic abscess.   PVD (peripheral vascular disease) (HCC) Bilateral AKA.   COPD -Stable, on home O2 2 L  Class 3 obesity (HCC) Calculated BMI is 65,3   Acute on chronic diastolic CHF (congestive heart failure) (HCC) Volume regulation with ultrafiltration in hemodialysis Continue midodrine for blood pressure support.   DVT prophylaxis: Heparin subcutaneous Code Status: Full code Family Communication: No family at bedside Disposition Plan: To be determined  Consultants: Nephrology   Procedures:   Antimicrobials:    Objective: Vitals:   02/03/22 2029 02/03/22 2217 02/04/22 0522 02/04/22 0917  BP: (!) 146/46 (!) 142/48 (!) 118/56 (!) 122/56  Pulse: 74 71 70 71  Resp: (!) 24 18 18    Temp: 97.9 F (36.6 C) 98.6 F (37 C) 98.5 F (36.9 C) 98.2 F (36.8 C)  TempSrc: Oral Oral Oral Oral  SpO2: 100% 100% 100% 100%  Weight:      Height:        Intake/Output Summary (Last 24 hours) at 02/04/2022 1121 Last data filed at 02/04/2022 0900 Gross per 24 hour  Intake 240 ml  Output 0 ml  Net 240 ml   Filed Weights   01/29/22 0259 01/30/22 0500 02/01/22 0500  Weight: 117.9 kg 117.9 kg (!) 149.2 kg    Examination:  General exam: Obese chronically ill female sitting up in bed, AAOx3, no distress CVS: S1-S2, regular rhythm Lungs: Clear bilaterally Abdomen: Soft, obese, large abdominal pannus with nodular areas consistent with heparin subcu injections Extremities: Bilateral AKA Skin: Sacral decubitus wound Psychiatry:  Mood & affect appropriate.     Data Reviewed:   CBC: Recent Labs  Lab 01/30/22 2159 02/04/22 0010  WBC 10.6* 11.2*  HGB 7.8* 7.9*  HCT 25.9* 26.3*  MCV 99.6 101.5*  PLT 297 242   Basic Metabolic Panel: Recent Labs  Lab 01/29/22 2235 01/30/22 2159  02/01/22 0207 02/04/22 0009 02/04/22 0428  NA 137 138 134* 136 139  K 5.6* 5.5* 4.2 4.4 6.0*  CL 98 94* 99 98 101  CO2 25 26 24 26 23   GLUCOSE 98 148* 178* 89 125*  BUN 34* 41* 22* 38* 45*  CREATININE 8.23* 9.87* 6.82* 8.84* 10.00*  CALCIUM 10.1 10.1 9.6 9.4 10.1  PHOS 5.8* 6.8* 5.7* 5.8* 6.3*   GFR: Estimated Creatinine Clearance: 9.9 mL/min (A) (by C-G formula based on SCr of 10 mg/dL (H)). Liver Function Tests: Recent Labs  Lab 01/29/22 2235 01/30/22 2159 02/01/22 0207 02/04/22 0009 02/04/22 0428  ALBUMIN 2.7* 2.7* 2.7* 2.7* 2.7*   No results for input(s): "LIPASE", "AMYLASE" in the last 168 hours. No results for input(s): "AMMONIA" in the last 168 hours. Coagulation Profile: No results for input(s): "INR", "PROTIME" in the last 168 hours. Cardiac Enzymes: No results for input(s): "CKTOTAL", "CKMB", "CKMBINDEX", "TROPONINI" in the last 168 hours. BNP (last 3 results) No results for input(s): "PROBNP" in the last 8760 hours. HbA1C: No results for input(s): "HGBA1C" in the last 72 hours. CBG: No results for input(s): "GLUCAP" in the last 168 hours.  Lipid Profile: No results for input(s): "CHOL", "HDL", "LDLCALC", "TRIG", "CHOLHDL", "LDLDIRECT" in the last 72 hours. Thyroid Function Tests: No results for input(s): "TSH", "T4TOTAL", "FREET4", "T3FREE", "THYROIDAB" in the last 72 hours. Anemia Panel: No results for input(s): "VITAMINB12", "FOLATE", "FERRITIN", "TIBC", "IRON", "RETICCTPCT" in the last 72 hours. Urine analysis: No results found for: "COLORURINE", "APPEARANCEUR", "LABSPEC", "PHURINE", "GLUCOSEU", "HGBUR", "BILIRUBINUR", "KETONESUR", "PROTEINUR", "UROBILINOGEN", "NITRITE", "LEUKOCYTESUR" Sepsis Labs: @LABRCNTIP (procalcitonin:4,lacticidven:4)  )No results found for this or any previous visit (from the past 240 hour(s)).   Radiology Studies: No results found.   Scheduled Meds:  aspirin EC  81 mg Oral Daily   atorvastatin  10 mg Oral Daily    darbepoetin (ARANESP) injection - DIALYSIS  200 mcg Subcutaneous Q Fri-1800   docusate sodium  100 mg Oral BID   enoxaparin (LOVENOX) injection  30 mg Subcutaneous Q24H   feeding supplement  1 Container Oral TID BM   gabapentin  100 mg Oral Q1200   gabapentin  200 mg Oral QHS   levothyroxine  100 mcg Oral Q0600   loratadine  10 mg Oral Daily   midodrine  10 mg Oral Q M,W,F-HD   pantoprazole  40 mg Oral Daily   sevelamer carbonate  4.8 g Oral TID WC   traZODone  50 mg Oral QHS   Continuous Infusions:     LOS: 95 days    Time spent: 9min    Domenic Polite, MD [facility]   02/04/2022, 11:21 AM

## 2022-02-04 NOTE — Progress Notes (Signed)
Patient refused labs post HD.

## 2022-02-04 NOTE — Plan of Care (Signed)
  Problem: Education: Goal: Knowledge of General Education information will improve Description: Including pain rating scale, medication(s)/side effects and non-pharmacologic comfort measures Outcome: Completed/Met

## 2022-02-04 NOTE — Sedation Documentation (Signed)
HR 73 RR 12 BP 151/88 O2 sats 100% RA  Patient is alert and oriented x4 prior to IR departure to hemodialysis unit

## 2022-02-04 NOTE — Progress Notes (Signed)
Physical Therapy Treatment Patient Details Name: Courtney Gray MRN: [id number] DOB: 14-Nov-1970 Today's Date: 02/04/2022   History of Present Illness Pt adm 7/28 with acute pulmonary edema; large sacral ulcer limiting sitting tolerance which makes SNF placement difficult for HD. 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    PT Comments    Continuing work on functional mobility and activity tolerance;  Session focused on brainstorming more options for mobility (knew plans for going to IR, so pt in bed at end of session); Practiced placing the [facility] amputee sling from a sitting up position with relatively good success; Then discussed lateral scoot and ant-post transfers, including positioning recliner against bed so pt can see how it would be done; Pt voiced concern abotu shearing forces at wound, and we reassured her that we would work to make sure the bed pad stayed still in relation to her skin, and that the pad would move across surfaces, not her wounds; discussed and demonstrated leaning, pushing up, and other methods for pressure relief;   She doesn't like the Roho cushion, tends to refuse it, and perhaps a gel or honeycomb cushion would meet her needs (she is not insensate, and maybe the Roho isn't as necessary for her as it is for a pt with SCI); it may be worth considering consulting with a seating and mobility specialist; consider asking AIR colleagues who they consult with for specialty cushions and wheelchairs j  Recommendations for follow up therapy are one component of a multi-disciplinary discharge planning process, led by the attending physician.  Recommendations may be updated based on patient status, additional functional criteria and insurance authorization.  Follow Up Recommendations  Skilled nursing-short term rehab (<3 hours/day) Can patient physically be transported by private vehicle: No    Assistance Recommended at Discharge Intermittent Supervision/Assistance  Patient can return home with the following Two people to help with walking and/or transfers;Two people to help with bathing/dressing/bathroom;Assist for transportation;Assistance with cooking/housework;Help with stairs or ramp for entrance   Equipment Recommendations  Wheelchair (measurements PT);Wheelchair cushion (measurements PT);Other (comment);Hospital bed (more accomodating mechanical lift, with sling that handles pt's with bil AKAs; It may be time to consider a specialty seating and wheelchair consultation)    Recommendations for Other Services       Precautions / Restrictions Precautions Precautions: Fall Precaution Comments: bil AKA, 2L O2 at baseline     Mobility  Bed Mobility Overal bed mobility: Modified Independent       Supine to sit: Modified independent (Device/Increase time)     General bed mobility comments: pulled to sit with bedrails, starting by pulling on L rail with both hands and then shifting weight to right and pushing up to sit    Transfers                   General transfer comment: Discussed options for OOB transfers that foster more independence like lateral scooting and anterior/posterior transfers; Pt appropriately voiced concern about possible shearing of her wound during those type transfers, and this PT validated her concerns    Ambulation/Gait                   Stairs             Wheelchair Mobility    Modified Rankin (Stroke Patients Only)       Balance     Sitting balance-[name] Scale: Good Sitting  balance - Comments: once sitting up (with unsupported trunk) in bed, practiced placing Maxisky amputee sling, and postioning it for use to lift Postural control: Right lateral lean, Left lateral lean (to take out sling)                                  Cognition Arousal/Alertness: Awake/alert Behavior During Therapy: WFL  for tasks assessed/performed Overall Cognitive Status: Within Functional Limits for tasks assessed                                          Exercises      General Comments General comments (skin integrity, edema, etc.): very good discussion re: having options for positioning, being OOB, and to be able to use Maxisky sling and lift from a more upright sitting position, like from a wheelchair, etc; Discussed and demonstrated ways to relieve pressure on wounds when up in recliner or wheelchair so that she can still be up and around and still relieve pressured      Pertinent Vitals/Pain Pain Assessment Pain Assessment: Faces Faces Pain Scale: Hurts little more Pain Location: abdomen Pain Descriptors / Indicators: Discomfort Pain Intervention(s): Monitored during session, Limited activity within patient's tolerance    Home Living                          Prior Function            PT Goals (current goals can now be found in the care plan section) Acute Rehab PT Goals Patient Stated Goal: Did not specifically state today PT Goal Formulation: With patient Time For Goal Achievement: 02/06/22 Potential to Achieve Goals: Good Progress towards PT goals: Progressing toward goals (slowly)    Frequency    Min 2X/week      PT Plan Current plan remains appropriate    Co-evaluation              AM-PAC PT "6 Clicks" Mobility   Outcome Measure  Help needed turning from your back to your side while in a flat bed without using bedrails?: None Help needed moving from lying on your back to sitting on the side of a flat bed without using bedrails?: A Little Help needed moving to and from a bed to a chair (including a wheelchair)?: Total Help needed standing up from a chair using your arms (e.g., wheelchair or bedside chair)?: Total Help needed to walk in hospital room?: Total Help needed climbing 3-5 steps with a railing? : Total 6 Click Score: 11     End of Session   Activity Tolerance: Patient tolerated treatment well Patient left: in bed;with call bell/phone within reach Nurse Communication: Mobility status (would like dressings changed) PT Visit Diagnosis: Other abnormalities of gait and mobility (R26.89);Muscle weakness (generalized) (M62.81) Pain - part of body: Hip;Leg     Time: 1400-1430 PT Time Calculation (min) (ACUTE ONLY): 30 min  Charges:  $Therapeutic Activity: 8-22 mins $Self Care/Home Management: [place] Office [place] 02/04/2022, 7:15 PM

## 2022-02-04 NOTE — Progress Notes (Signed)
   Patient Status: [facility] - In-pt  Assessment and Plan: Patient in need of venous access.   Tunneled dialysis catheter exchange Malfunction  ______________________________________________________________________   History of Present Illness: Courtney Gray is a 51 y.o. 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 New catheter was vigorously aspirated confirming function at the completion of the procedure.  Per Dr Moshe Cipro note yesterday: Was called by HD RN-  there was some kind of pulling to her [facility] and now it is not functional-  it is not pushing or pulling-  it most likely needs to be replaced.  Will make pt npo and put in order for IR to exchange [facility] for tomorrow     Scheduled now for exchange in IR  Allergies and medications reviewed.   Review of Systems: A 12 point ROS discussed and pertinent positives are indicated in the HPI above.  All other systems are negative.  Vital Signs: BP (!) 118/56 (BP Location: Left Arm)   Pulse 70   Temp 98.5 F (36.9 C) (Oral)   Resp 18   Ht 5\' 5"  (1.651 m)   Wt (!) 329 lb (149.2 kg)   SpO2 100%   BMI 54.75 kg/m   Physical Exam Vitals reviewed.  Cardiovascular:     Rate and Rhythm: Normal rate and regular rhythm.  Pulmonary:     Effort: Pulmonary effort is normal.     Breath sounds: Wheezing present.  Abdominal:     Palpations: Abdomen is soft.  Musculoskeletal:        General: Normal range of motion.  Skin:    General: Skin is warm.  Neurological:     Mental Status: She is alert and oriented to person, place, and time.  Psychiatric:        Behavior: Behavior normal.      Imaging reviewed.   Labs:  COAGS: Recent Labs     07/10/21 0627 09/10/21 0403 09/28/21 0151 09/30/21 0500 09/30/21 1049 10/01/21 0847 10/02/21 0621 10/09/21 1219  INR 1.3* 1.3*  --   --   --   --   --  1.3*  APTT  --   --    < > 85* 72* 69* 65*  --    < > = values in this interval not displayed.    BMP: Recent Labs    01/30/22 2159 02/01/22 0207 02/04/22 0009 02/04/22 0428  NA 138 134* 136 139  K 5.5* 4.2 4.4 6.0*  CL 94* 99 98 101  CO2 26 24 26 23   GLUCOSE 148* 178* 89 125*  BUN 41* 22* 38* 45*  CALCIUM 10.1 9.6 9.4 10.1  CREATININE 9.87* 6.82* 8.84* 10.00*  GFRNONAA 4* 7* 5* 4*    Scheduled for Left IJ dialysis catheter exchange Pt is aware of procedure benefits and risks- including but not limited to Infection; bleeding; vessel damage Consent signed and in chart   Electronically Signed: Lavonia Drafts, PA-C 02/04/2022, 9:17 AM   I spent a total of 15 minutes in face to face in clinical consultation, greater than 50% of which was counseling/coordinating care for venous access.

## 2022-02-05 DIAGNOSIS — E1169 Type 2 diabetes mellitus with other specified complication: Secondary | ICD-10-CM | POA: Diagnosis not present

## 2022-02-05 DIAGNOSIS — L89153 Pressure ulcer of sacral region, stage 3: Secondary | ICD-10-CM | POA: Diagnosis not present

## 2022-02-05 DIAGNOSIS — N186 End stage renal disease: Secondary | ICD-10-CM | POA: Diagnosis not present

## 2022-02-05 DIAGNOSIS — I10 Essential (primary) hypertension: Secondary | ICD-10-CM | POA: Diagnosis not present

## 2022-02-05 LAB — RENAL FUNCTION PANEL
Albumin: 2.7 g/dL — ABNORMAL LOW (ref 3.5–5.0)
Anion gap: 11 (ref 5–15)
BUN: 22 mg/dL — ABNORMAL HIGH (ref 6–20)
CO2: 28 mmol/L (ref 22–32)
Calcium: 9.8 mg/dL (ref 8.9–10.3)
Chloride: 99 mmol/L (ref 98–111)
Creatinine, Ser: 6.6 mg/dL — ABNORMAL HIGH (ref 0.44–1.00)
GFR, Estimated: 7 mL/min — ABNORMAL LOW (ref >60)
Glucose, Bld: 119 mg/dL — ABNORMAL HIGH (ref 70–99)
Phosphorus: 5.8 mg/dL — ABNORMAL HIGH (ref 2.5–4.6)
Potassium: 4.2 mmol/L (ref 3.5–5.1)
Sodium: 138 mmol/L (ref 135–145)

## 2022-02-05 MED ORDER — HEPARIN SODIUM (PORCINE) 1000 UNIT/ML IJ SOLN
INTRAMUSCULAR | Status: AC
  Filled 2022-02-05: qty 5

## 2022-02-05 NOTE — Progress Notes (Signed)
[facility] Progress Note   Subjective: Seen in room. Argued about returning to HD today to resume MWF schedule but conceded to go. New TDC placed. K+ high 02/04/2022. No repeat labs. Will order now.   Objective Vitals:   02/04/22 2154 02/04/22 2237 02/05/22 0456 02/05/22 0937  BP: (!) 127/49 (!) 117/53 (!) 148/82 (!) 97/46  Pulse: 71 68 77 71  Resp: 14 18 18 18   Temp: 97.9 F (36.6 C) 97.9 F (36.6 C) 98.3 F (36.8 C) 98.6 F (37 C)  TempSrc: Oral Oral Oral Oral  SpO2: 100% 100% 100% 100%  Weight:      Height:       Physical Exam General:Morbidly obese female in NAD. Heart: S1/S2 no M/R/G noted Lungs: lying flat with no orthopnea noted Abdomen: Soft/non-tender with BS+  Extremities: Bilateral AKA. No stump edema.  Dialysis Access: TDC. Drsg intact   Additional Objective Labs: Basic Metabolic Panel: Recent Labs  Lab 02/01/22 0207 02/04/22 0009 02/04/22 0428  NA 134* 136 139  K 4.2 4.4 6.0*  CL 99 98 101  CO2 24 26 23   GLUCOSE 178* 89 125*  BUN 22* 38* 45*  CREATININE 6.82* 8.84* 10.00*  CALCIUM 9.6 9.4 10.1  PHOS 5.7* 5.8* 6.3*   Liver Function Tests: Recent Labs  Lab 02/01/22 0207 02/04/22 0009 02/04/22 0428  ALBUMIN 2.7* 2.7* 2.7*   No results for input(s): "LIPASE", "AMYLASE" in the last 168 hours. CBC: Recent Labs  Lab 01/30/22 2159 02/04/22 0010  WBC 10.6* 11.2*  HGB 7.8* 7.9*  HCT 25.9* 26.3*  MCV 99.6 101.5*  PLT 297 210   Blood Culture    Component Value Date/Time   SDES BLOOD LEFT ANTECUBITAL 10/16/2021 1534   SPECREQUEST  10/16/2021 1534    BOTTLES DRAWN AEROBIC AND ANAEROBIC Blood Culture results may not be optimal due to an inadequate volume of blood received in culture bottles   CULT  10/16/2021 1534    NO GROWTH 5 DAYS Performed at [facility], [place] [address], [place], [place] [address]    REPTSTATUS 10/21/2021 FINAL 10/16/2021 1534    Cardiac Enzymes: No results for input(s): "CKTOTAL", "CKMB",  "CKMBINDEX", "TROPONINI" in the last 168 hours. CBG: No results for input(s): "GLUCAP" in the last 168 hours. Iron Studies: No results for input(s): "IRON", "TIBC", "TRANSFERRIN", "FERRITIN" in the last 72 hours. @lablastinr3 @ Studies/Results: IR Fluoro Guide CV Line Left  Result Date: 02/04/2022 CLINICAL DATA:  Malfunctioning left jugular tunneled dialysis catheter. EXAM: EXCHANGE OF TUNNELED CENTRAL VENOUS HEMODIALYSIS CATHETER UNDER FLUOROSCOPIC GUIDANCE ANESTHESIA/SEDATION: Moderate (conscious) sedation was employed during this procedure. A total of Versed 1.0 mg was administered intravenously. Moderate Sedation Time: 14 minutes. The patient's level of consciousness and vital signs were monitored continuously by radiology nursing throughout the procedure under my direct supervision. MEDICATIONS: 1 g IV vancomycin. IV antibiotic was given in a appropriate time interval prior to skin puncture. FLUOROSCOPY: 30 seconds.  7.0 mGy. PROCEDURE: The procedure, risks, benefits, and alternatives were explained to the patient. Questions regarding the procedure were encouraged and answered. The patient understands and consents to the procedure. A time-out was performed prior to initiating the procedure. The preexisting dialysis catheter and surrounding skin were prepped with chlorhexidine in a sterile fashion, and a sterile drape was applied covering the operative field. Maximum barrier sterile technique with sterile gowns and gloves were used for the procedure. Local anesthesia was provided with 1% lidocaine. The preexisting dialysis catheter was removed over a guidewire after  freeing the subcutaneous cuff using blunt dissection. A new palindrome tunneled hemodialysis catheter measuring 28 cm from tip to cuff was chosen for placement. This was advanced over the guidewire under fluoroscopy. Final catheter positioning was confirmed and documented with a fluoroscopic spot image. The catheter was aspirated, flushed  with saline, and injected with appropriate volume heparin dwells. COMPLICATIONS: None.  No pneumothorax. FINDINGS: Initial fluoroscopy demonstrates a pre-existing left jugular 23 cm tip to cuff length catheter with catheter tip at the SVC/RA junction. Based on poor catheter function during hemodialysis, decision was made to place a longer 28 cm tunneled catheter. After new catheter placement, the tip lies in the right atrium. The catheter aspirates normally and is ready for immediate use. IMPRESSION: Exchange of tunneled hemodialysis catheter via the left internal jugular vein. The pre-existing 23 cm tip to cuff length catheter was exchanged for a longer 28 cm tip to cuff length catheter. The catheter tip lies in the right atrium. The catheter is ready for immediate use. Electronically Signed   By: Aletta Edouard M.D.   On: 02/04/2022 17:20   Medications:   aspirin EC  81 mg Oral Daily   atorvastatin  10 mg Oral Daily   darbepoetin (ARANESP) injection - DIALYSIS  200 mcg Subcutaneous Q Fri-1800   docusate sodium  100 mg Oral BID   enoxaparin (LOVENOX) injection  30 mg Subcutaneous Q24H   feeding supplement  1 Container Oral TID BM   gabapentin  100 mg Oral Q1200   gabapentin  200 mg Oral QHS   heparin sodium (porcine)  4,200 Units Intracatheter Once   levothyroxine  100 mcg Oral Q0600   loratadine  10 mg Oral Daily   midodrine  10 mg Oral Q M,W,F-HD   pantoprazole  40 mg Oral Daily   sevelamer carbonate  4.8 g Oral TID WC   sodium zirconium cyclosilicate  10 g Oral Once   traZODone  50 mg Oral QHS     Assessment/Plan: Inability to obtain HD at OP center D/T body habitus. Difficult situation with no clear answers yet. PT working with pt.  ESRD- HD schedule MWF. Missed HD 10/30 D/T failed TDC. New TDC placed 02/04/2022. HD late 02/04/2022. Short HD today to resume MWF Schedule.  Hyperkalemia -  K 6.0 after missing HD 02/03/2022. Repeat Lokelma 10 grams po X 1. HD later today.  HTN/volume- On  midodrine 10mg  qHD, with 10mg  prn mid HD w/parameters to keep SBP>100 d/t becoming symptomatic when it drops lower.  Significant weight loss during admission.  Remains euvolemic on exam.  Severe ischial sacral decubitus wound: Per primary/wound care team.  Limiting her, noted to be slow healing.  Goal to tolerate sitting upright - which she is doing.  see Dr Verlon Au pic.01/17/22 COPD history of hypoxia on [place] O2 at home(was on 5 L at baseline has been able to taper to 2-3 L ).  Anemia of ESRD - Hgb 7.8 on high-dose ESA q weekly. Recent Fe load completed.  Secondary hyperparathyroidism - phos back to 5.7. Renvela 4.8g AC TID started 10/24 . Calcium at goal. Not on VDRA, pth 60 01/30/22.  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 Nutrition - diet continues to be changed from renal.  Currently on heart healthy diet.  Stay on renal diet.  Disposition: Difficult situation. Prior to admit, patient required 6 staff members and 2 hoyer pads for transfer into HD chair as OP. Could not stay on HD for full  tx. 2/2 pain of multiple decubitus ulcers and PTAR unable to lift pt into ambulance for transport.SW working on LTC at [employer] and PT working to get pt up to bariatric chair. Her wt is much lower from admit. Has tolerated 3hrs in chair so far.  Will need to tolerate 5-6hrs in a chair to tolerate OP dialysis including transport, treatment and cannulation time.  The main issue is her Rt ischial wound. PT is using a high-quality air cushion from the CIR unit. This remains to be very painful for her to sit.   Sakoya Win H. Mutasim Tuckey NP-C 02/05/2022, 12:12 PM  [employer] [phone]

## 2022-02-05 NOTE — Progress Notes (Signed)
Noted slight improvement in blood pressure with saline given and uf being turned off. Patient was readjusted in the bed for comfort she is complaining of pain in her abdomen. Will leave uf off until bp further improves. Patient is being monitored closely.

## 2022-02-05 NOTE — Progress Notes (Signed)
Patient c/o tenderness around cvc insertion site, venous lumen would only pull slightly with patient turning her head and coughing to be able to remove the heparin from lumen, it flushed fine.

## 2022-02-05 NOTE — Progress Notes (Addendum)
Received patient in bed to unit.  Alert and oriented.  Informed consent signed and in chart.   Treatment initiated: 1502 Treatment completed: 1728 Patient ended tx with 1mins left on the machine. Juanell Fairly NP aware.  Patient tolerated well.  Transported back to the room  Alert, without acute distress.  Hand-off given to patient's nurse.   Access used: L IJ Access issues: None  Total UF removed: 1111ml Medication(s) given: Oxycodone 5mg  PO Post HD VS: 98, 116/34, 59, 22, 92% on 2L Post HD weight: unable to obtain accurate weight.   [facility]

## 2022-02-05 NOTE — Progress Notes (Signed)
Received patient in bed to unit.  Alert and oriented.  Informed consent signed and in chart.   Treatment initiated: [place] Treatment completed: 2145  Patient tolerated well.  Transported back to the room  Alert, without acute distress.  Hand-off given to patient's nurse.   Access used: catheter Access issues: none  Total UF removed: 2000 Medication(s) given: none Post HD VS: 127/49 Post HD weight: unable to obtain   [facility]

## 2022-02-05 NOTE — Progress Notes (Signed)
Noted improvement in patient blood pressure, uf is back on patient denies symptoms at this time.Courtney Gray

## 2022-02-05 NOTE — Progress Notes (Signed)
Progress Note   Patient: Courtney Gray [id number]:[id number] DOB: 1970-08-21 DOA: 11/01/2021     96 DOS: the patient was seen and examined on 02/05/2022   Brief hospital course: 51 year old F ESRD on HD TTS,  recent lengthy hospitalization for Bacteroides Thetaiotaomicron bacteremia, hepatic and splenic abscess --required amputation of third digit of left hand and underwent left AKA and had splenic drain as well as CT-guided drainage of liver abscess she had a line holiday on last admission on long-term IV antibiotics until 11/05/173 diastolic CHF, PVD s/p bilateral AKA, IDDM-2, morbid obesity and  chronic sacral wound presenting with progressive shortness of breath in the setting of missed dialysis and admitted for hypertensive emergency  Per patient, she never missed her dialysis however according to outpatient HD records, it takes at least 4 staff members and [facility] lift for patient to be seated in the dialysis chair.    Symptoms improved with hemodialysis.  Patient underwent R UE venogram on 8/30.  Vascular surgery following for possible RUE HD access.    Patient is now waiting placement where she can get HD on stretcher that has been difficult to find.  Assessment and Plan: * End stage renal disease [facility] Patient will continue renal replacement therapy per nephrology recommendations.  Needs placement to continue with HD as outpatient.  Very deconditioned, large sacral decubitus wound.   Patient today found seated in bed with good toleration and she is willing to try chair HD tomorrow.   Anemia of chronic kidney disease, Hgb has been stable.  Continue with EPO and Iron supplementation.   Metabolic bone disease, continue with sevelamer.   Hyperkalemia, hypomagnesemia, hypokalemia, hyponatremia, hypophosphatemia have been corrected with renal replacement therapy.   Hypertension Patient on midodrine on HD to prevent hypotension. Currently off antihypertensive medications.   Decubitus  ulcer of sacral region, stage 3 (HCC) Deep wound at the sacrum, patient not able to seat.   Continue with oral oxycodone, discontinue hydromorphone  Continue with local wound care.    Type 2 diabetes mellitus with hyperlipidemia (HCC) Her glucose has been stable and insulin therapy has been discontinued.   Statin therapy with atorvastatin.   Bacteremia Patient has completed antibiotic therapy. Bacteroides, splenic abscess.   PVD (peripheral vascular disease) (HCC) Bilateral AKA.   Continue local wound care.    Class 3 obesity (HCC) Calculated BMI is 65,3   Acute on chronic diastolic CHF (congestive heart failure) (HCC) Volume regulation with ultrafiltration in hemodialysis Continue midodrine for blood pressure support.         Subjective: Patient with no chest pain or dyspnea, continue with pain at the decubitus ulcer and having difficulty seating for prolonged period of time   Physical Exam: Vitals:   02/05/22 1530 02/05/22 1600 02/05/22 1615 02/05/22 1630  BP: (!) 144/54 (!) 71/54 (!) 108/53 109/69  Pulse: 65 79 69 (!) 58  Resp: 19 (!) 24 13 (!) 23  Temp:      TempSrc:      SpO2: 100% 100% 99% 99%  Weight:      Height:       Neurology awake and alert ENt with mild pallor Cardiovascular with S1 and S2 present and rhythmic Respiratory with no rales or wheezing Abdomen with no distention Bilateral AKA  Data Reviewed:    Family Communication: no family at the bedside   Disposition: Status is: Inpatient Remains inpatient appropriate because: pending placement   Planned Discharge Destination:  to be determined      Author: Riccardo Dubin  Gerome Apley, MD 02/05/2022 4:56 PM  For on call review [email or web address].

## 2022-02-05 NOTE — Progress Notes (Signed)
Noted decrease in pt blood pressure, patient is stating she is dizzy and light headed uf is off at this time and 140ml ns was given to help relieve symptoms. Rechecked blood pressure and it was 84/70. Rn Angela Nevin was notified of the change in patient blood pressure. Will continue to monitor closely for further changes.

## 2022-02-06 DIAGNOSIS — N186 End stage renal disease: Secondary | ICD-10-CM | POA: Diagnosis not present

## 2022-02-06 DIAGNOSIS — E1169 Type 2 diabetes mellitus with other specified complication: Secondary | ICD-10-CM | POA: Diagnosis not present

## 2022-02-06 DIAGNOSIS — I10 Essential (primary) hypertension: Secondary | ICD-10-CM | POA: Diagnosis not present

## 2022-02-06 DIAGNOSIS — J9611 Chronic respiratory failure with hypoxia: Secondary | ICD-10-CM | POA: Diagnosis not present

## 2022-02-06 NOTE — Progress Notes (Signed)
Physical Therapy Treatment Patient Details Name: Courtney Gray MRN: [id number] DOB: 03/08/1971 Today's Date: 02/06/2022   History of Present Illness Pt adm 7/28 with acute pulmonary edema; large sacral ulcer limiting sitting tolerance which makes SNF placement difficult for HD. 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    PT Comments    Session focused on technique and practicing of AP transfer in bed in preparation to attempt OOB transfer to chair via this technique. Instructed on slow controlled "walking of hips" backwards to limit shear forces that was being created with fast twisting motion. Educated on head hips relationship and use of BUE to assist with transfer. Patient excited and eager to progress with transfers. Continue to recommend SNF for ongoing Physical Therapy.       Recommendations for follow up therapy are one component of a multi-disciplinary discharge planning process, led by the attending physician.  Recommendations may be updated based on patient status, additional functional criteria and insurance authorization.  Follow Up Recommendations  Skilled nursing-short term rehab (<3 hours/day) Can patient physically be transported by private vehicle: No   Assistance Recommended at Discharge Intermittent Supervision/Assistance  Patient can return home with the following Two people to help with walking and/or transfers;Two people to help with bathing/dressing/bathroom;Assist for transportation;Assistance with cooking/housework;Help with stairs or ramp for entrance   Equipment Recommendations  Wheelchair (measurements PT);Wheelchair cushion (measurements PT);Other (comment);Hospital bed (more accomodating mechanical lift, with sling that handles pt's with bil AKAs; It may be time to consider a specialty seating and wheelchair consultation)    Recommendations for Other Services        Precautions / Restrictions Precautions Precautions: Fall Precaution Comments: bil AKA, 2L O2 at baseline     Mobility  Bed Mobility Overal bed mobility: Modified Independent             General bed mobility comments: able to come into long sitting with use of bed rails    Transfers Overall transfer level: Needs assistance Equipment used: None Transfers: Bed to chair/wheelchair/BSC             General transfer comment: worked on AP transfer to/from [facility] to learn technique and sequencing prior to attempting OOB. Initially, patient with fast shearing motion on bed. Instructed patient slow controlled scoot backwards with education on head hips relationship. Able to perform slow controlled "walking of hips" backwards on bed. Increased difficulty with going forward due to abdominal pain    Ambulation/Gait                   Stairs             Wheelchair Mobility    Modified Rankin (Stroke Patients Only)       Balance Overall balance assessment: Mild deficits observed, not formally tested                                          Cognition Arousal/Alertness: Awake/alert Behavior During Therapy: WFL for tasks assessed/performed Overall Cognitive Status: Within Functional Limits for tasks assessed                                          Exercises  General Comments        Pertinent Vitals/Pain Pain Assessment Pain Assessment: Faces Faces Pain Scale: Hurts little more Pain Location: abdomen Pain Descriptors / Indicators: Discomfort Pain Intervention(s): Monitored during session    Home Living                          Prior Function            PT Goals (current goals can now be found in the care plan section) Acute Rehab PT Goals PT Goal Formulation: With patient Time For Goal Achievement: 02/20/22 Potential to Achieve Goals: Good Progress towards PT goals: Progressing toward goals     Frequency    Min 2X/week      PT Plan Current plan remains appropriate    Co-evaluation              AM-PAC PT "6 Clicks" Mobility   Outcome Measure  Help needed turning from your back to your side while in a flat bed without using bedrails?: None Help needed moving from lying on your back to sitting on the side of a flat bed without using bedrails?: A Little Help needed moving to and from a bed to a chair (including a wheelchair)?: Total Help needed standing up from a chair using your arms (e.g., wheelchair or bedside chair)?: Total Help needed to walk in hospital room?: Total Help needed climbing 3-5 steps with a railing? : Total 6 Click Score: 11    End of Session Equipment Utilized During Treatment: Oxygen Activity Tolerance: Patient tolerated treatment well Patient left: in bed;with call bell/phone within reach Nurse Communication: Mobility status PT Visit Diagnosis: Other abnormalities of gait and mobility (R26.89);Muscle weakness (generalized) (M62.81) Pain - Right/Left: Right Pain - part of body: Hip;Leg     Time: 1437-1510 PT Time Calculation (min) (ACUTE ONLY): 33 min  Charges:  $Therapeutic Activity: 23-37 mins                     [place] Mckeough A. Gilford Rile PT, DPT Acute Rehabilitation Services Office [phone]    [name] 02/06/2022, 4:47 PM

## 2022-02-06 NOTE — Progress Notes (Addendum)
Progress Note   Patient: Courtney Gray UXN:[id number] DOB: 09-Jun-1970 DOA: 11/01/2021     97 DOS: the patient was seen and examined on 02/06/2022   Brief hospital course: 51 year old F ESRD on HD TTS,  recent lengthy hospitalization for Bacteroides Thetaiotaomicron bacteremia, hepatic and splenic abscess --required amputation of third digit of left hand and underwent left AKA and had splenic drain as well as CT-guided drainage of liver abscess she had a line holiday on last admission on long-term IV antibiotics until 05/12/4268 diastolic CHF, PVD s/p bilateral AKA, IDDM-2, morbid obesity and  chronic sacral wound presenting with progressive shortness of breath in the setting of missed dialysis and admitted for hypertensive emergency  Per patient, she never missed her dialysis however according to outpatient HD records, it takes at least 4 staff members and [facility] lift for patient to be seated in the dialysis chair.    Symptoms improved with hemodialysis.  Patient underwent R UE venogram on 8/30.  Vascular surgery following for possible RUE HD access.    Patient is now waiting placement where she can get HD on stretcher that has been difficult to find.  Assessment and Plan: * End stage renal disease [facility]) Patient will continue renal replacement therapy per nephrology recommendations.  Needs placement to continue with HD as outpatient.  Very deconditioned, large sacral decubitus wound.   Continue renal replacement therapy per nephrology recommendations.   Anemia of chronic kidney disease, Hgb has been stable.  Continue with EPO and Iron supplementation.   Metabolic bone disease, continue with sevelamer.   Hyperkalemia, hypomagnesemia, hypokalemia, hyponatremia, hypophosphatemia have been corrected with renal replacement therapy.   Hypertension Patient on midodrine on HD to prevent hypotension. Currently off antihypertensive medications.   Decubitus ulcer of sacral region, stage 3  (HCC) Deep wound at the sacrum, patient not able to seat.   Continue with oral oxycodone, discontinue hydromorphone  Continue with local wound care.    Type 2 diabetes mellitus with hyperlipidemia (HCC) Her glucose has been stable and insulin therapy has been discontinued.   Statin therapy with atorvastatin.   Bacteremia Patient has completed antibiotic therapy. Bacteroides, splenic abscess.   PVD (peripheral vascular disease) (HCC) Bilateral AKA.   Continue local wound care.    Class 3 obesity (HCC) Calculated BMI is 65,3   Acute on chronic diastolic CHF (congestive heart failure) (HCC) Volume regulation with ultrafiltration in hemodialysis Continue midodrine for blood pressure support.         Subjective: Patient having pain and induration in the abdomen subcutaneous tissue at the site of enoxaparin injections   Physical Exam: Vitals:   02/05/22 1909 02/05/22 2111 02/06/22 0437 02/06/22 0805  BP: (!) 145/53 (!) 109/45 (!) 125/46 (!) 124/50  Pulse: 68 65 71 69  Resp:  18 18   Temp:  98.4 F (36.9 C) 98.9 F (37.2 C) 98.5 F (36.9 C)  TempSrc:  Oral Oral Oral  SpO2: 100% 100% 100% 100%  Weight:      Height:       Neurology awake and alert ENT with mild pallor Cardiovascular with S1 and S2 present ant rhythmic Respiratory with no rales or wheezing Abdomen with indurated regiones lower abdomen tender to palpation with no erythema or increased local temperature  Bilateral AKA  Data Reviewed:    Family Communication: no family at the bedside   Disposition: Status is: Inpatient Remains inpatient appropriate because: pending placement   Planned Discharge Destination:  to be determined  Author: Tawni Millers, MD 02/06/2022 10:20 AM  For on call review [email or web address].

## 2022-02-06 NOTE — Progress Notes (Signed)
[facility] Progress Note   Subjective:Seen in room no new C/Os. RKB     Objective Vitals:   02/05/22 1909 02/05/22 2111 02/06/22 0437 02/06/22 0805  BP: (!) 145/53 (!) 109/45 (!) 125/46 (!) 124/50  Pulse: 68 65 71 69  Resp:  18 18   Temp:  98.4 F (36.9 C) 98.9 F (37.2 C) 98.5 F (36.9 C)  TempSrc:  Oral Oral Oral  SpO2: 100% 100% 100% 100%  Weight:      Height:       Physical Exam General:Morbidly obese female in NAD. Heart: S1/S2 no M/R/G noted Lungs: lying flat with no orthopnea noted Abdomen: Soft/non-tender with BS+  Extremities: Bilateral AKA. No stump edema.  Dialysis Access: TDC. Drsg intact    Additional Objective Labs: Basic Metabolic Panel: Recent Labs  Lab 02/04/22 0009 02/04/22 0428 02/05/22 1300  NA 136 139 138  K 4.4 6.0* 4.2  CL 98 101 99  CO2 26 23 28   GLUCOSE 89 125* 119*  BUN 38* 45* 22*  CREATININE 8.84* 10.00* 6.60*  CALCIUM 9.4 10.1 9.8  PHOS 5.8* 6.3* 5.8*   Liver Function Tests: Recent Labs  Lab 02/04/22 0009 02/04/22 0428 02/05/22 1300  ALBUMIN 2.7* 2.7* 2.7*   No results for input(s): "LIPASE", "AMYLASE" in the last 168 hours. CBC: Recent Labs  Lab 01/30/22 2159 02/04/22 0010  WBC 10.6* 11.2*  HGB 7.8* 7.9*  HCT 25.9* 26.3*  MCV 99.6 101.5*  PLT 297 210   Blood Culture    Component Value Date/Time   SDES BLOOD LEFT ANTECUBITAL 10/16/2021 1534   SPECREQUEST  10/16/2021 1534    BOTTLES DRAWN AEROBIC AND ANAEROBIC Blood Culture results may not be optimal due to an inadequate volume of blood received in culture bottles   CULT  10/16/2021 1534    NO GROWTH 5 DAYS Performed at [facility], [place] [address], [place], [place] [address]    REPTSTATUS 10/21/2021 FINAL 10/16/2021 1534    Cardiac Enzymes: No results for input(s): "CKTOTAL", "CKMB", "CKMBINDEX", "TROPONINI" in the last 168 hours. CBG: No results for input(s): "GLUCAP" in the last 168 hours. Iron Studies: No results for input(s):  "IRON", "TIBC", "TRANSFERRIN", "FERRITIN" in the last 72 hours. @lablastinr3 @ Studies/Results: IR Fluoro Guide CV Line Left  Result Date: 02/04/2022 CLINICAL DATA:  Malfunctioning left jugular tunneled dialysis catheter. EXAM: EXCHANGE OF TUNNELED CENTRAL VENOUS HEMODIALYSIS CATHETER UNDER FLUOROSCOPIC GUIDANCE ANESTHESIA/SEDATION: Moderate (conscious) sedation was employed during this procedure. A total of Versed 1.0 mg was administered intravenously. Moderate Sedation Time: 14 minutes. The patient's level of consciousness and vital signs were monitored continuously by radiology nursing throughout the procedure under my direct supervision. MEDICATIONS: 1 g IV vancomycin. IV antibiotic was given in a appropriate time interval prior to skin puncture. FLUOROSCOPY: 30 seconds.  7.0 mGy. PROCEDURE: The procedure, risks, benefits, and alternatives were explained to the patient. Questions regarding the procedure were encouraged and answered. The patient understands and consents to the procedure. A time-out was performed prior to initiating the procedure. The preexisting dialysis catheter and surrounding skin were prepped with chlorhexidine in a sterile fashion, and a sterile drape was applied covering the operative field. Maximum barrier sterile technique with sterile gowns and gloves were used for the procedure. Local anesthesia was provided with 1% lidocaine. The preexisting dialysis catheter was removed over a guidewire after freeing the subcutaneous cuff using blunt dissection. A new palindrome tunneled hemodialysis catheter measuring 28 cm from tip to cuff was chosen for  placement. This was advanced over the guidewire under fluoroscopy. Final catheter positioning was confirmed and documented with a fluoroscopic spot image. The catheter was aspirated, flushed with saline, and injected with appropriate volume heparin dwells. COMPLICATIONS: None.  No pneumothorax. FINDINGS: Initial fluoroscopy demonstrates a  pre-existing left jugular 23 cm tip to cuff length catheter with catheter tip at the SVC/RA junction. Based on poor catheter function during hemodialysis, decision was made to place a longer 28 cm tunneled catheter. After new catheter placement, the tip lies in the right atrium. The catheter aspirates normally and is ready for immediate use. IMPRESSION: Exchange of tunneled hemodialysis catheter via the left internal jugular vein. The pre-existing 23 cm tip to cuff length catheter was exchanged for a longer 28 cm tip to cuff length catheter. The catheter tip lies in the right atrium. The catheter is ready for immediate use. Electronically Signed   By: Aletta Edouard M.D.   On: 02/04/2022 17:20   Medications:   aspirin EC  81 mg Oral Daily   atorvastatin  10 mg Oral Daily   darbepoetin (ARANESP) injection - DIALYSIS  200 mcg Subcutaneous Q Fri-1800   docusate sodium  100 mg Oral BID   feeding supplement  1 Container Oral TID BM   gabapentin  100 mg Oral Q1200   gabapentin  200 mg Oral QHS   levothyroxine  100 mcg Oral Q0600   loratadine  10 mg Oral Daily   midodrine  10 mg Oral Q M,W,F-HD   pantoprazole  40 mg Oral Daily   sevelamer carbonate  4.8 g Oral TID WC   sodium zirconium cyclosilicate  10 g Oral Once   traZODone  50 mg Oral QHS     Assessment/Plan: Inability to obtain HD at OP center D/T body habitus. Difficult situation with no clear answers yet. PT working with pt.  ESRD- HD schedule MWF. Missed HD 10/30 D/T failed TDC. New TDC placed 02/04/2022. HD late 02/04/2022. Short HD today to resume MWF Schedule.  Hyperkalemia -  K 6.0 after missing HD 02/03/2022. Repeat Lokelma 10 grams po X 1. HD later today.  HTN/volume- On midodrine 10mg  qHD, with 10mg  prn mid HD w/parameters to keep SBP>100 d/t becoming symptomatic when it drops lower.  Significant weight loss during admission.  Remains euvolemic on exam.  Severe ischial sacral decubitus wound: Per primary/wound care team.  Limiting  her, noted to be slow healing.  Goal to tolerate sitting upright - which she is doing.  see Dr Verlon Au pic.01/17/22 COPD history of hypoxia on [place] O2 at home(was on 5 L at baseline has been able to taper to 2-3 L [place]).  Anemia of ESRD - Hgb 7.8 on high-dose ESA q weekly. Recent Fe load completed.  Secondary hyperparathyroidism - phos back to 5.7. Renvela 4.8g AC TID started 10/24 . Calcium at goal. Not on VDRA, pth 60 01/30/22.  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 Nutrition - diet continues to be changed from renal.  Currently on heart healthy diet.  Stay on renal diet.  Disposition: Difficult situation. Prior to admit, patient required 6 staff members and 2 hoyer pads for transfer into HD chair as OP. Could not stay on HD for full tx. 2/2 pain of multiple decubitus ulcers and PTAR unable to lift pt into ambulance for transport.SW working on LTC at [employer] and PT working to get pt up to bariatric chair. Her wt is much lower from admit. Has tolerated 3hrs in  chair so far.  Will need to tolerate 5-6hrs in a chair to tolerate OP dialysis including transport, treatment and cannulation time.  The main issue is her Rt ischial wound. PT is using a high-quality air cushion from the CIR unit. This remains to be very painful for her to sit.   Courtney Gray H. Hoorain Kozakiewicz NP-C 02/06/2022, 1:14 PM  [employer] [phone]

## 2022-02-07 DIAGNOSIS — I10 Essential (primary) hypertension: Secondary | ICD-10-CM | POA: Diagnosis not present

## 2022-02-07 DIAGNOSIS — N186 End stage renal disease: Secondary | ICD-10-CM | POA: Diagnosis not present

## 2022-02-07 DIAGNOSIS — E1169 Type 2 diabetes mellitus with other specified complication: Secondary | ICD-10-CM | POA: Diagnosis not present

## 2022-02-07 DIAGNOSIS — J9611 Chronic respiratory failure with hypoxia: Secondary | ICD-10-CM | POA: Diagnosis not present

## 2022-02-07 MED ORDER — HEPARIN SODIUM (PORCINE) 1000 UNIT/ML IJ SOLN
INTRAMUSCULAR | Status: AC
  Administered 2022-02-07: 2500 [IU] via INTRAVENOUS
  Filled 2022-02-07: qty 3

## 2022-02-07 MED ORDER — HEPARIN SODIUM (PORCINE) 1000 UNIT/ML IJ SOLN
INTRAMUSCULAR | Status: AC
  Filled 2022-02-07: qty 3

## 2022-02-07 MED ORDER — ALTEPLASE 2 MG IJ SOLR: 2.0000 mg | Freq: Once | INTRAMUSCULAR | Status: DC | PRN

## 2022-02-07 NOTE — Procedures (Signed)
HD Note:  Some information was entered later than the data was gathered due to patient care needs. The stated time with the data is accurate.  Received patient in bed to unit.  Alert and oriented.  Informed consent signed and in chart.    Patient tolerated well.  Transported back to the room  Alert, without acute distress.  Hand-off given to patient's nurse.   Access used: HD catheter Access issues: None  Total UF removed: [facility]

## 2022-02-07 NOTE — Progress Notes (Signed)
[facility] Progress Note   Subjective:  Seen in room - for HD later today. She's upset and concerned about a few things that she has noticed with dialysis over the past week - concerns addressed today. No CP or dyspnea.   **This patient will not be seen over this weekend (11/4-11/5/23) unless emergency. If any renal/dialysis needs, please page Pollyann Kennedy [phone].  Objective Vitals:   02/06/22 2307 02/07/22 0541 02/07/22 0602 02/07/22 0909  BP:   (!) 120/56 105/69  Pulse:   71 79  Resp:   18   Temp:   98.7 F (37.1 C) 98.7 F (37.1 C)  TempSrc:   Oral Oral  SpO2:   100% 100%  Weight: 117.9 kg 116.1 kg    Height:       Physical Exam General: Obese woman, NAD. Nasal O2 in place. Heart:RRR; no murmur Lungs: CTA anteriorly, no wheezing/rales Abdomen: soft Extremities: B AKA; no stump edema Dialysis Access: [facility] - dressing intact/dry  Additional Objective Labs: Basic Metabolic Panel: Recent Labs  Lab 02/04/22 0009 02/04/22 0428 02/05/22 1300  NA 136 139 138  K 4.4 6.0* 4.2  CL 98 101 99  CO2 26 23 28   GLUCOSE 89 125* 119*  BUN 38* 45* 22*  CREATININE 8.84* 10.00* 6.60*  CALCIUM 9.4 10.1 9.8  PHOS 5.8* 6.3* 5.8*   Liver Function Tests: Recent Labs  Lab 02/04/22 0009 02/04/22 0428 02/05/22 1300  ALBUMIN 2.7* 2.7* 2.7*   CBC: Recent Labs  Lab 02/04/22 0010  WBC 11.2*  HGB 7.9*  HCT 26.3*  MCV 101.5*  PLT 210   Medications:   aspirin EC  81 mg Oral Daily   atorvastatin  10 mg Oral Daily   darbepoetin (ARANESP) injection - DIALYSIS  200 mcg Subcutaneous Q Fri-1800   docusate sodium  100 mg Oral BID   feeding supplement  1 Container Oral TID BM   gabapentin  100 mg Oral Q1200   gabapentin  200 mg Oral QHS   levothyroxine  100 mcg Oral Q0600   loratadine  10 mg Oral Daily   midodrine  10 mg Oral Q M,W,F-HD   pantoprazole  40 mg Oral Daily   sevelamer carbonate  4.8 g Oral TID WC   sodium zirconium cyclosilicate  10 g Oral  Once   traZODone  50 mg Oral QHS    Dialysis Orders: No outpatient orders/placement.  Assessment/Plan: Inability to obtain HD at OP center D/T body habitus. Difficult situation with no clear answers yet. PT working with pt.  ESRD: Continue HD on MWF schedule. New TDC placed 02/04/2022. HD today. Hyperkalemia -  K 6.0 after missing HD 02/03/2022. Lokelma given - improved with HD. HypoTN/volume- On midodrine 10mg  qHD, with 10mg  prn mid HD w/parameters to keep SBP >100 d/t becoming symptomatic when it drops lower.  Significant weight loss during admission.  Remains euvolemic on exam.  Severe ischial sacral decubitus wound: Per primary/wound care team.  Limiting her, noted to be slow healing.  Goal to tolerate sitting upright - which she is doing. COPD history of hypoxia on [place] O2 at home (was on 5 L at baseline has been able to taper to 2-3 L ).  Anemia of ESRD - Hgb 7.9 on max dose Aranesp 258mcg weekly. Recent Fe load completed.  Secondary hyperparathyroidism: Phos still up but improved - continue Renvela 4.8g AC TID. Not on VDRA, last PTH 60 on 01/30/22.  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 Nutrition - diet continues to be changed from renal.  Currently on heart healthy diet.  Stay on renal diet.  Disposition: Difficult situation. Prior to admit, patient required 6 staff members and 2 hoyer pads for transfer into HD chair as OP. Could not stay on HD for full tx. 2/2 pain of multiple decubitus ulcers and PTAR unable to lift pt into ambulance for transport.SW working on LTC at [employer] and PT working to get pt up to bariatric chair. Her wt is much lower from admit. Has tolerated 3hrs in chair so far.  Will need to tolerate 5-6hrs in a chair to tolerate OP dialysis including transport, treatment and cannulation time.  The main issue is her Rt ischial wound. PT is using a high-quality air cushion from the CIR unit. This remains to be very painful for her to sit.   [name], PA-C 02/07/2022, 9:35 AM  [employer]

## 2022-02-07 NOTE — Progress Notes (Signed)
  Progress Note   Patient: Courtney Gray XNT:[id number] DOB: 09-Jun-1970 DOA: 11/01/2021     98 DOS: the patient was seen and examined on 02/07/2022   Brief hospital course: 51 year old F ESRD on HD TTS,  recent lengthy hospitalization for Bacteroides Thetaiotaomicron bacteremia, hepatic and splenic abscess --required amputation of third digit of left hand and underwent left AKA and had splenic drain as well as CT-guided drainage of liver abscess she had a line holiday on last admission on long-term IV antibiotics until 12/11/7589 diastolic CHF, PVD s/p bilateral AKA, IDDM-2, morbid obesity and  chronic sacral wound presenting with progressive shortness of breath in the setting of missed dialysis and admitted for hypertensive emergency  Per patient, she never missed her dialysis however according to outpatient HD records, it takes at least 4 staff members and [facility] lift for patient to be seated in the dialysis chair.    Symptoms improved with hemodialysis.  Patient underwent R UE venogram on 8/30.  Vascular surgery following for possible RUE HD access.    Patient is now waiting placement where she can get HD on stretcher that has been difficult to find.  Assessment and Plan: * End stage renal disease [facility]) Patient will continue renal replacement therapy per nephrology recommendations.  Needs placement to continue with HD as outpatient.  Very deconditioned, large sacral decubitus wound.   Continue renal replacement therapy per nephrology recommendations.   Anemia of chronic kidney disease, Hgb has been stable.  Continue with EPO and Iron supplementation.   Metabolic bone disease, continue with sevelamer.   Hyperkalemia, hypomagnesemia, hypokalemia, hyponatremia, hypophosphatemia have been corrected with renal replacement therapy.   Hypertension Patient on midodrine on HD to prevent hypotension. Currently off antihypertensive medications.   Decubitus ulcer of sacral region, stage 3  (HCC) Deep wound at the sacrum, patient not able to seat.   Continue with oral oxycodone, discontinue hydromorphone  Continue with local wound care.    Type 2 diabetes mellitus with hyperlipidemia (HCC) Her glucose has been stable and insulin therapy has been discontinued.   Statin therapy with atorvastatin.   Bacteremia Patient has completed antibiotic therapy. Bacteroides, splenic abscess.   PVD (peripheral vascular disease) (HCC) Bilateral AKA.   Continue local wound care.    Class 3 obesity (HCC) Calculated BMI is 65,3   Acute on chronic diastolic CHF (congestive heart failure) (HCC) Volume regulation with ultrafiltration in hemodialysis Continue midodrine for blood pressure support.         Subjective: Patient with no chest pain, no dyspnea, continue to be very weak and not able to do chair HD   Physical Exam: Vitals:   02/07/22 1533 02/07/22 1601 02/07/22 1630 02/07/22 1700  BP: (!) 132/57 120/76 (!) 122/43 131/81  Pulse: 67 67  65  Resp: 15 19 20 17   Temp:      TempSrc:      SpO2: 100% 100% 100% 100%  Weight:      Height:       Neurology awake and alert ENT with mild pallor Cardiovascular with S1 and S2 present and rhythmic Respiratory with no rales Abdomen with no distention  Bilateral AKA  Data Reviewed:    Family Communication: no family at the bedside   Disposition: Status is: Inpatient Remains inpatient appropriate because: pending placement   Planned Discharge Destination:  to be determined, SNF       Author: Tawni Millers, MD 02/07/2022 5:29 PM  For on call review [email or web address].

## 2022-02-07 NOTE — Plan of Care (Signed)
  Problem: Clinical Measurements: Goal: Respiratory complications will improve Outcome: Progressing   Problem: Activity: Goal: Risk for activity intolerance will decrease Outcome: Progressing   Problem: Nutrition: Goal: Adequate nutrition will be maintained Outcome: Progressing   Problem: Coping: Goal: Level of anxiety will decrease Outcome: Progressing   Problem: Skin Integrity: Goal: Risk for impaired skin integrity will decrease Outcome: Progressing   Problem: Safety: Goal: Ability to remain free from injury will improve Outcome: Progressing

## 2022-02-08 DIAGNOSIS — N186 End stage renal disease: Secondary | ICD-10-CM | POA: Diagnosis not present

## 2022-02-08 DIAGNOSIS — L89153 Pressure ulcer of sacral region, stage 3: Secondary | ICD-10-CM | POA: Diagnosis not present

## 2022-02-08 DIAGNOSIS — E1169 Type 2 diabetes mellitus with other specified complication: Secondary | ICD-10-CM | POA: Diagnosis not present

## 2022-02-08 DIAGNOSIS — J9611 Chronic respiratory failure with hypoxia: Secondary | ICD-10-CM | POA: Diagnosis not present

## 2022-02-08 MED ORDER — DIPHENHYDRAMINE HCL 25 MG PO CAPS
25.0000 mg | ORAL_CAPSULE | Freq: Every day | ORAL | Status: DC
Start: 2022-02-08 — End: 2022-02-13
  Administered 2022-02-08 – 2022-02-12 (×5): 25 mg via ORAL
  Filled 2022-02-08 (×5): qty 1

## 2022-02-08 MED ORDER — LACTULOSE 10 GM/15ML PO SOLN
20.0000 g | Freq: Every day | ORAL | Status: DC | PRN
  Administered 2022-02-10: 20 g via ORAL
  Filled 2022-02-08: qty 30

## 2022-02-08 NOTE — Progress Notes (Signed)
Patient refused Darbepoetin Alfa Injection. Educated patient on importance of receiving the injection on Friday dialysis days. Patient stated she does not want anymore injections/shots.

## 2022-02-08 NOTE — Procedures (Signed)
Pt seen in [place] and running on dialysis w/o any side effects.  BFR stable, alarms quiet, running well on HD.   I was present at this dialysis session, have reviewed the session itself and made  appropriate changes Kelly Splinter MD [place] pager [phone]   02/08/2022, 12:13 PM

## 2022-02-08 NOTE — Progress Notes (Addendum)
Progress Note   Patient: Courtney Gray PJA:[id number] DOB: Aug 17, 1970 DOA: 11/01/2021     99 DOS: the patient was seen and examined on 02/08/2022   Brief hospital course: 51 year old F ESRD on HD TTS,  recent lengthy hospitalization for Bacteroides Thetaiotaomicron bacteremia, hepatic and splenic abscess --required amputation of third digit of left hand and underwent left AKA and had splenic drain as well as CT-guided drainage of liver abscess she had a line holiday on last admission on long-term IV antibiotics until 06/09/1935 diastolic CHF, PVD s/p bilateral AKA, IDDM-2, morbid obesity and  chronic sacral wound presenting with progressive shortness of breath in the setting of missed dialysis and admitted for hypertensive emergency  Per patient, she never missed her dialysis however according to outpatient HD records, it takes at least 4 staff members and [facility] lift for patient to be seated in the dialysis chair.    Symptoms improved with hemodialysis.  Patient underwent R UE venogram on 8/30.  Vascular surgery following for possible RUE HD access.    Patient is now waiting placement where she can get HD on stretcher that has been difficult to find.  Assessment and Plan: * End stage renal disease [facility]) Patient will continue renal replacement therapy per nephrology recommendations.  Needs placement to continue with HD as outpatient.  Very deconditioned, large sacral decubitus wound.   Continue renal replacement therapy per nephrology recommendations.   Anemia of chronic kidney disease, Hgb has been stable.  Continue with EPO and Iron supplementation.   Metabolic bone disease, continue with sevelamer.   Hyperkalemia, hypomagnesemia, hypokalemia, hyponatremia, hypophosphatemia have been corrected with renal replacement therapy.   Hypertension Patient on midodrine on HD to prevent hypotension. Currently off antihypertensive medications.   Decubitus ulcer of sacral region, stage 3  (HCC) Deep wound at the sacrum, patient not able to seat.   Continue with oral oxycodone. Continue with local wound care.    Type 2 diabetes mellitus with hyperlipidemia (HCC) Her glucose has been stable and insulin therapy has been discontinued.   Statin therapy with atorvastatin.   Bacteremia Patient has completed antibiotic therapy. Bacteroides, splenic abscess.   PVD (peripheral vascular disease) (HCC) Bilateral AKA.   Continue local wound care.    Class 3 obesity (HCC) Calculated BMI is 65,3   Acute on chronic diastolic CHF (congestive heart failure) (HCC) Volume regulation with ultrafiltration in hemodialysis Continue midodrine for blood pressure support.         Subjective: Patient continue with difficulty sleeping at night, she has not been able to seat for HD   Physical Exam: Vitals:   02/07/22 1940 02/08/22 0500 02/08/22 0537 02/08/22 0920  BP: (!) 130/48  (!) 126/51 (!) 127/45  Pulse: 72  71 68  Resp: 18  17 20   Temp: 98.2 F (36.8 C)  98.3 F (36.8 C) 98 F (36.7 C)  TempSrc: Oral   Oral  SpO2: 96%  97% 98%  Weight:  121.6 kg    Height:       Neurology awake and alert ENT with mild pallor Cardiovascular with S1 and S2 present and rhythmic Respiratory with no rales or wheezing Abdomen soft and non tender Bilateral AKA  Data Reviewed:    Family Communication: her daughter at the bedside   Disposition: Status is: Inpatient Remains inpatient appropriate because: needs placement, not able to seat for HD, not able to find placement   Planned Discharge Destination: SNF      Author: Tawni Millers, MD 02/08/2022 4:39  PM  For on call review [email or web address].

## 2022-02-09 DIAGNOSIS — J9611 Chronic respiratory failure with hypoxia: Secondary | ICD-10-CM | POA: Diagnosis not present

## 2022-02-09 DIAGNOSIS — N186 End stage renal disease: Secondary | ICD-10-CM | POA: Diagnosis not present

## 2022-02-09 DIAGNOSIS — E1169 Type 2 diabetes mellitus with other specified complication: Secondary | ICD-10-CM | POA: Diagnosis not present

## 2022-02-09 DIAGNOSIS — L89153 Pressure ulcer of sacral region, stage 3: Secondary | ICD-10-CM | POA: Diagnosis not present

## 2022-02-09 MED ORDER — OXYCODONE HCL 5 MG PO TABS
5.0000 mg | ORAL_TABLET | Freq: Four times a day (QID) | ORAL | Status: DC | PRN
  Administered 2022-02-10 – 2022-02-16 (×5): 5 mg via ORAL
  Filled 2022-02-09 (×5): qty 1

## 2022-02-09 MED ORDER — ENOXAPARIN SODIUM 30 MG/0.3ML IJ SOSY
30.0000 mg | PREFILLED_SYRINGE | INTRAMUSCULAR | Status: DC
  Administered 2022-02-13: 30 mg via SUBCUTANEOUS
  Filled 2022-02-09 (×4): qty 0.3

## 2022-02-09 NOTE — Progress Notes (Addendum)
  Progress Note   Patient: Courtney Gray OYD:[id number] DOB: 1971/03/16 DOA: 11/01/2021     100 DOS: the patient was seen and examined on 02/09/2022   Brief hospital course: 51 year old F ESRD on HD TTS,  recent lengthy hospitalization for Bacteroides Thetaiotaomicron bacteremia, hepatic and splenic abscess --required amputation of third digit of left hand and underwent left AKA and had splenic drain as well as CT-guided drainage of liver abscess she had a line holiday on last admission on long-term IV antibiotics until 09/11/2092 diastolic CHF, PVD s/p bilateral AKA, IDDM-2, morbid obesity and  chronic sacral wound presenting with progressive shortness of breath in the setting of missed dialysis and admitted for hypertensive emergency  Per patient, she never missed her dialysis however according to outpatient HD records, it takes at least 4 staff members and [facility] lift for patient to be seated in the dialysis chair.    Symptoms improved with hemodialysis.  Patient underwent R UE venogram on 8/30.  Vascular surgery following for possible RUE HD access.    Patient is now waiting placement where she can get HD on stretcher that has been difficult to find.  Assessment and Plan: * End stage renal disease [facility]) Patient will continue renal replacement therapy per nephrology recommendations.  Needs placement to continue with HD as outpatient.  Very deconditioned, large sacral decubitus wound.   Continue renal replacement therapy per nephrology recommendations.   Anemia of chronic kidney disease, Hgb has been stable.  Continue with EPO and Iron supplementation.   Metabolic bone disease, continue with sevelamer.   Hyperkalemia, hypomagnesemia, hypokalemia, hyponatremia, hypophosphatemia have been corrected with renal replacement therapy.   Hypertension Patient on midodrine on HD to prevent hypotension. Currently off antihypertensive medications.   Decubitus ulcer of sacral region, stage 3  (HCC) Deep wound at the sacrum, patient not able to seat.   Continue with oral oxycodone. Continue with local wound care.    Type 2 diabetes mellitus with hyperlipidemia (HCC) Her glucose has been stable and insulin therapy has been discontinued.   Statin therapy with atorvastatin.   Bacteremia Patient has completed antibiotic therapy. Bacteroides, splenic abscess.   PVD (peripheral vascular disease) (HCC) Bilateral AKA.   Continue local wound care.    Class 3 obesity (HCC) Calculated BMI is 65,3   Acute on chronic diastolic CHF (congestive heart failure) (HCC) Volume regulation with ultrafiltration in hemodialysis Continue midodrine for blood pressure support.         Subjective: Patient with no chest pain or dyspnea,   Physical Exam: Vitals:   02/08/22 2201 02/09/22 0500 02/09/22 0548 02/09/22 0923  BP: (!) 129/44  (!) 125/45 (!) 122/58  Pulse:   72 80  Resp:   17   Temp:   98.3 F (36.8 C) 98.1 F (36.7 C)  TempSrc:    Oral  SpO2:   98% 100%  Weight:  121.8 kg    Height:       Neurology somnolent but easy to arouse ENT with no pallor Cardiovascular with S1 and S2 present Respiratory with no wheezing Abdomen with no distention  Bilateral AKA Data Reviewed:    Family Communication: no family at the bedside   Disposition: Status is: Inpatient Remains inpatient appropriate because: pending placement   Planned Discharge Destination: Skilled nursing facility     Author: Tawni Millers, MD 02/09/2022 1:36 PM  For on call review [email or web address].

## 2022-02-09 NOTE — Plan of Care (Signed)
  Problem: Health Behavior/Discharge Planning: Goal: Ability to manage health-related needs will improve Outcome: Progressing   Problem: Clinical Measurements: Goal: Cardiovascular complication will be avoided Outcome: Progressing   Problem: Activity: Goal: Risk for activity intolerance will decrease Outcome: Progressing   Problem: Nutrition: Goal: Adequate nutrition will be maintained Outcome: Progressing   Problem: Coping: Goal: Level of anxiety will decrease Outcome: Progressing   Problem: Pain Managment: Goal: General experience of comfort will improve Outcome: Progressing   Problem: Safety: Goal: Ability to remain free from injury will improve Outcome: Progressing   Problem: Skin Integrity: Goal: Risk for impaired skin integrity will decrease Outcome: Progressing

## 2022-02-10 DIAGNOSIS — I10 Essential (primary) hypertension: Secondary | ICD-10-CM | POA: Diagnosis not present

## 2022-02-10 DIAGNOSIS — N186 End stage renal disease: Secondary | ICD-10-CM | POA: Diagnosis not present

## 2022-02-10 DIAGNOSIS — J9611 Chronic respiratory failure with hypoxia: Secondary | ICD-10-CM | POA: Diagnosis not present

## 2022-02-10 DIAGNOSIS — E1169 Type 2 diabetes mellitus with other specified complication: Secondary | ICD-10-CM | POA: Diagnosis not present

## 2022-02-10 LAB — CBC
HCT: 26.5 % — ABNORMAL LOW (ref 36.0–46.0)
Hemoglobin: 8.2 g/dL — ABNORMAL LOW (ref 12.0–15.0)
MCH: 30.1 pg (ref 26.0–34.0)
MCHC: 30.9 g/dL (ref 30.0–36.0)
MCV: 97.4 fL (ref 80.0–100.0)
Platelets: 275 10*3/uL (ref 150–400)
RBC: 2.72 MIL/uL — ABNORMAL LOW (ref 3.87–5.11)
RDW: 14.5 % (ref 11.5–15.5)
WBC: 10.7 10*3/uL — ABNORMAL HIGH (ref 4.0–10.5)
nRBC: 0 % (ref 0.0–0.2)

## 2022-02-10 LAB — RENAL FUNCTION PANEL
Albumin: 2.7 g/dL — ABNORMAL LOW (ref 3.5–5.0)
Anion gap: 17 — ABNORMAL HIGH (ref 5–15)
BUN: 66 mg/dL — ABNORMAL HIGH (ref 6–20)
CO2: 21 mmol/L — ABNORMAL LOW (ref 22–32)
Calcium: 9.8 mg/dL (ref 8.9–10.3)
Chloride: 97 mmol/L — ABNORMAL LOW (ref 98–111)
Creatinine, Ser: 11.09 mg/dL — ABNORMAL HIGH (ref 0.44–1.00)
GFR, Estimated: 4 mL/min — ABNORMAL LOW (ref >60)
Glucose, Bld: 117 mg/dL — ABNORMAL HIGH (ref 70–99)
Phosphorus: 6.3 mg/dL — ABNORMAL HIGH (ref 2.5–4.6)
Potassium: 5.2 mmol/L — ABNORMAL HIGH (ref 3.5–5.1)
Sodium: 135 mmol/L (ref 135–145)

## 2022-02-10 MED ORDER — DARBEPOETIN ALFA 200 MCG/0.4ML IJ SOSY
200.0000 ug | PREFILLED_SYRINGE | INTRAMUSCULAR | Status: DC
  Administered 2022-02-11: 200 ug via SUBCUTANEOUS
  Filled 2022-02-10 (×2): qty 0.4

## 2022-02-10 MED ORDER — HEPARIN SODIUM (PORCINE) 1000 UNIT/ML DIALYSIS
20.0000 [IU]/kg | INTRAMUSCULAR | Status: DC | PRN
  Filled 2022-02-10 (×2): qty 3

## 2022-02-10 NOTE — Plan of Care (Signed)
  Problem: Health Behavior/Discharge Planning: Goal: Ability to manage health-related needs will improve Outcome: Progressing   Problem: Clinical Measurements: Goal: Cardiovascular complication will be avoided Outcome: Progressing   Problem: Activity: Goal: Risk for activity intolerance will decrease Outcome: Progressing   Problem: Nutrition: Goal: Adequate nutrition will be maintained Outcome: Progressing   Problem: Coping: Goal: Level of anxiety will decrease Outcome: Progressing   Problem: Elimination: Goal: Will not experience complications related to bowel motility Outcome: Progressing   Problem: Pain Managment: Goal: General experience of comfort will improve Outcome: Progressing   Problem: Skin Integrity: Goal: Risk for impaired skin integrity will decrease Outcome: Progressing

## 2022-02-10 NOTE — Progress Notes (Addendum)
Transport called and stated that pt refused to come to dialysis due to not being able to eat dinner before coming.   Pt made aware that if she didn't come then she would have to wait til next treatment due to staffing. She still refused.

## 2022-02-10 NOTE — Progress Notes (Signed)
[facility] Progress Note   Subjective:   No new events reported over the weekend. She reports doing well, no edema/SOB/CP/dizziness or nausea. No issues with last HD.   Objective Vitals:   02/09/22 0923 02/09/22 1809 02/09/22 2139 02/10/22 0520  BP: (!) 122/58 108/66 (!) 122/48 (!) 127/49  Pulse: 80 75 71 75  Resp:   17 17  Temp: 98.1 F (36.7 C) 98.2 F (36.8 C) 98.8 F (37.1 C) 98.2 F (36.8 C)  TempSrc: Oral Oral Oral   SpO2: 100%  100% 97%  Weight:    121.9 kg  Height:       Physical Exam General: Obese female, alert and in NAD Heart: RRR, no murmurs, rubs or gallops Lungs: CTA bilaterally without wheezing, rhonchi or rales Abdomen: Soft, non-distended, +BS Extremities: No edema b/l hips Dialysis Access: [facility] with intact dressing  Additional Objective Labs: Basic Metabolic Panel: Recent Labs  Lab 02/04/22 0009 02/04/22 0428 02/05/22 1300  NA 136 139 138  K 4.4 6.0* 4.2  CL 98 101 99  CO2 26 23 28   GLUCOSE 89 125* 119*  BUN 38* 45* 22*  CREATININE 8.84* 10.00* 6.60*  CALCIUM 9.4 10.1 9.8  PHOS 5.8* 6.3* 5.8*   Liver Function Tests: Recent Labs  Lab 02/04/22 0009 02/04/22 0428 02/05/22 1300  ALBUMIN 2.7* 2.7* 2.7*   No results for input(s): "LIPASE", "AMYLASE" in the last 168 hours. CBC: Recent Labs  Lab 02/04/22 0010  WBC 11.2*  HGB 7.9*  HCT 26.3*  MCV 101.5*  PLT 210   Blood Culture    Component Value Date/Time   SDES BLOOD LEFT ANTECUBITAL 10/16/2021 1534   SPECREQUEST  10/16/2021 1534    BOTTLES DRAWN AEROBIC AND ANAEROBIC Blood Culture results may not be optimal due to an inadequate volume of blood received in culture bottles   CULT  10/16/2021 1534    NO GROWTH 5 DAYS Performed at [facility], [place] [address], [place], [place] [address]    REPTSTATUS 10/21/2021 FINAL 10/16/2021 1534    Cardiac Enzymes: No results for input(s): "CKTOTAL", "CKMB", "CKMBINDEX", "TROPONINI" in the last 168 hours. CBG: No  results for input(s): "GLUCAP" in the last 168 hours. Iron Studies: No results for input(s): "IRON", "TIBC", "TRANSFERRIN", "FERRITIN" in the last 72 hours. @lablastinr3 @ Studies/Results: No results found. Medications:   aspirin EC  81 mg Oral Daily   atorvastatin  10 mg Oral Daily   darbepoetin (ARANESP) injection - DIALYSIS  200 mcg Subcutaneous Q Fri-1800   diphenhydrAMINE  25 mg Oral QHS   docusate sodium  100 mg Oral BID   enoxaparin (LOVENOX) injection  30 mg Subcutaneous Q24H   feeding supplement  1 Container Oral TID BM   gabapentin  100 mg Oral Q1200   gabapentin  200 mg Oral QHS   levothyroxine  100 mcg Oral Q0600   loratadine  10 mg Oral Daily   midodrine  10 mg Oral Q M,W,F-HD   pantoprazole  40 mg Oral Daily   sevelamer carbonate  4.8 g Oral TID WC    No outpatient HD placement  Assessment/Plan: Inability to obtain HD at OP center D/T body habitus and wound. Difficult situation with no clear answers yet. PT working with pt.  ESRD: Continue HD on MWF schedule. New TDC placed 02/04/2022. HD today. Hyperkalemia -  K 6.0 after missing HD 02/03/2022. Lokelma given - improved with HD. Will recheck labs prior to HD today.  HypoTN/volume- On midodrine 10mg  qHD,  with 10mg  prn mid HD w/parameters to keep SBP >100 d/t becoming symptomatic when it drops lower.  Significant weight loss during admission.  Remains euvolemic on exam.  Severe ischial sacral decubitus wound: Per primary/wound care team.  Limiting her, noted to be slow healing.  Goal to tolerate sitting upright - which she is doing with PT  COPD history of hypoxia on [place] O2 at home (was on 5 L at baseline has been able to taper to 2-3 L [place]).  Anemia of ESRD - Hgb 7.9 on max dose Aranesp 223mcg weekly. Refused last dose, will reschedule it. Recent Fe load completed.  Secondary hyperparathyroidism: Phos still up but improved - continue Renvela 4.8g AC TID. Calcium elevated. Not on VDRA, last PTH 60 on 01/30/22.  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 Nutrition - diet continues to be changed from renal.  Currently on heart healthy diet.  Stay on renal diet.  Disposition: Difficult situation. Prior to admit, patient required 6 staff members and 2 hoyer pads for transfer into HD chair as OP. Could not stay on HD for full tx. 2/2 pain of multiple decubitus ulcers and PTAR unable to lift pt into ambulance for transport.SW working on LTC at [employer] and PT working to get pt up to bariatric chair. Her wt is much lower from admit. Has tolerated 3hrs in chair so far.  Will need to tolerate 5-6hrs in a chair to tolerate OP dialysis including transport, treatment and cannulation time.  The main issue is her Rt ischial wound. PT is using a high-quality air cushion from the CIR unit. This remains to be very painful for her to sit.   Anice Paganini, PA-C 02/10/2022, 8:43 AM  [facility] Pager: [phone]

## 2022-02-10 NOTE — Progress Notes (Signed)
  Progress Note   Patient: Courtney Gray [id number] DOB: 1970-04-29 DOA: 11/01/2021     101 DOS: the patient was seen and examined on 02/10/2022   Brief hospital course: 51 year old F ESRD on HD TTS,  recent lengthy hospitalization for Bacteroides Thetaiotaomicron bacteremia, hepatic and splenic abscess --required amputation of third digit of left hand and underwent left AKA and had splenic drain as well as CT-guided drainage of liver abscess she had a line holiday on last admission on long-term IV antibiotics until 08/14/7414 diastolic CHF, PVD s/p bilateral AKA, IDDM-2, morbid obesity and  chronic sacral wound presenting with progressive shortness of breath in the setting of missed dialysis and admitted for hypertensive emergency  Per patient, she never missed her dialysis however according to outpatient HD records, it takes at least 4 staff members and [facility] lift for patient to be seated in the dialysis chair.    Symptoms improved with hemodialysis.  Patient underwent R UE venogram on 8/30.  Vascular surgery following for possible RUE HD access.    Patient is now waiting placement where she can get HD on stretcher that has been difficult to find.  Assessment and Plan: * End stage renal disease [facility]) Patient will continue renal replacement therapy per nephrology recommendations.  Needs placement to continue with HD as outpatient.  Very deconditioned, large sacral decubitus wound.   Continue renal replacement therapy per nephrology recommendations.   Anemia of chronic kidney disease, Hgb has been stable.  Continue with EPO and Iron supplementation.   Metabolic bone disease, continue with sevelamer.   Hyperkalemia, hypomagnesemia, hypokalemia, hyponatremia, hypophosphatemia have been corrected with renal replacement therapy.   Hypertension Patient on midodrine on HD to prevent hypotension. Currently off antihypertensive medications.   Decubitus ulcer of sacral region, stage 3  (HCC) Deep wound at the sacrum, patient not able to seat.   Continue with oral oxycodone. Continue with local wound care.    Type 2 diabetes mellitus with hyperlipidemia (HCC) Her glucose has been stable and insulin therapy has been discontinued.   Statin therapy with atorvastatin.   Bacteremia Patient has completed antibiotic therapy. Bacteroides, splenic abscess.   PVD (peripheral vascular disease) (HCC) Bilateral AKA.   Continue local wound care.    Class 3 obesity (HCC) Calculated BMI is 65,3   Acute on chronic diastolic CHF (congestive heart failure) (HCC) Volume regulation with ultrafiltration in hemodialysis Continue midodrine for blood pressure support.         Subjective: Patient with no chest pain or dyspnea, she has declined enoxaparin injections due to sq pain.   Physical Exam: Vitals:   02/09/22 0923 02/09/22 1809 02/09/22 2139 02/10/22 0520  BP: (!) 122/58 108/66 (!) 122/48 (!) 127/49  Pulse: 80 75 71 75  Resp:   17 17  Temp: 98.1 F (36.7 C) 98.2 F (36.8 C) 98.8 F (37.1 C) 98.2 F (36.8 C)  TempSrc: Oral Oral Oral   SpO2: 100%  100% 97%  Weight:    121.9 kg  Height:       Neurology awake and alert ENT with mild pallor Cardiovascular with S1 and S2 present and rhythmic Respiratory with no rales or wheezing Abdomen with no distention  Bilateral AKA  Data Reviewed:    Family Communication: no family at the bedside   Disposition: Status is: Inpatient Remains inpatient appropriate because: pending placement   Planned Discharge Destination: Home     Author: Tawni Millers, MD 02/10/2022 3:23 PM  For on call review [email or web address].

## 2022-02-11 DIAGNOSIS — I5043 Acute on chronic combined systolic (congestive) and diastolic (congestive) heart failure: Secondary | ICD-10-CM | POA: Insufficient documentation

## 2022-02-11 DIAGNOSIS — I5023 Acute on chronic systolic (congestive) heart failure: Secondary | ICD-10-CM | POA: Diagnosis not present

## 2022-02-11 DIAGNOSIS — N186 End stage renal disease: Secondary | ICD-10-CM | POA: Diagnosis not present

## 2022-02-11 DIAGNOSIS — I10 Essential (primary) hypertension: Secondary | ICD-10-CM | POA: Diagnosis not present

## 2022-02-11 DIAGNOSIS — I739 Peripheral vascular disease, unspecified: Secondary | ICD-10-CM | POA: Diagnosis not present

## 2022-02-11 MED ORDER — DARBEPOETIN ALFA 200 MCG/0.4ML IJ SOSY
200.0000 ug | PREFILLED_SYRINGE | INTRAMUSCULAR | Status: DC
  Administered 2022-02-18 – 2022-05-13 (×13): 200 ug via SUBCUTANEOUS
  Filled 2022-02-11 (×13): qty 0.4

## 2022-02-11 MED ORDER — HEPARIN SODIUM (PORCINE) 1000 UNIT/ML IJ SOLN
INTRAMUSCULAR | Status: AC
  Administered 2022-02-11: 1000 [IU]
  Filled 2022-02-11: qty 4

## 2022-02-11 MED ORDER — HEPARIN SODIUM (PORCINE) 1000 UNIT/ML DIALYSIS: 20.0000 [IU]/kg | INTRAMUSCULAR | Status: DC | PRN

## 2022-02-11 NOTE — Progress Notes (Signed)
  Progress Note   Patient: Courtney Gray QAS:[id number] DOB: [birth year] DOA: 11/01/2021     102 DOS: the patient was seen and examined on 02/11/2022   Brief hospital course: 51 year old F ESRD on HD TTS,  recent lengthy hospitalization for Bacteroides Thetaiotaomicron bacteremia, hepatic and splenic abscess --required amputation of third digit of left hand and underwent left AKA and had splenic drain as well as CT-guided drainage of liver abscess she had a line holiday on last admission on long-term IV antibiotics until 10/14/8919 diastolic CHF, PVD s/p bilateral AKA, IDDM-2, morbid obesity and  chronic sacral wound presenting with progressive shortness of breath in the setting of missed dialysis and admitted for hypertensive emergency  Per patient, she never missed her dialysis however according to outpatient HD records, it takes at least 4 staff members and [facility] lift for patient to be seated in the dialysis chair.    Symptoms improved with hemodialysis.  Patient underwent R UE venogram on 8/30.  Vascular surgery following for possible RUE HD access.    Patient is now waiting placement where she can get HD on stretcher that has been difficult to find.  Assessment and Plan: End stage renal disease [facility]) Patient will continue renal replacement therapy per nephrology recommendations.  Needs placement to continue with HD as outpatient.  Very deconditioned, large sacral decubitus wound.   Continue renal replacement therapy per nephrology recommendations.   Anemia of chronic kidney disease, Hgb has been stable.  Continue with EPO and Iron supplementation.   Metabolic bone disease, continue with sevelamer.   Hyperkalemia, hypomagnesemia, hypokalemia, hyponatremia, hypophosphatemia have been corrected with renal replacement therapy.   Hypertension Patient on midodrine on HD to prevent hypotension. Currently off antihypertensive medications.   Decubitus ulcer of sacral region, stage 3  (HCC) Deep wound at the sacrum, patient not able to seat.   Continue with oral oxycodone. Continue with local wound care.    Type 2 diabetes mellitus with hyperlipidemia (HCC) Her glucose has been stable and insulin therapy has been discontinued.   Statin therapy with atorvastatin.   Bacteremia Patient has completed antibiotic therapy. Bacteroides, splenic abscess.   PVD (peripheral vascular disease) (HCC) Bilateral AKA.   Continue local wound care.    Class 3 obesity (HCC) Calculated BMI is 65,3   Acute on chronic diastolic CHF (congestive heart failure) (HCC) Volume regulation with ultrafiltration in hemodialysis Continue midodrine for blood pressure support.         Subjective: Patient has been working with physical therapy, seating with help, no dyspnea,   Physical Exam: Vitals:   02/11/22 1730 02/11/22 1800 02/11/22 1830 02/11/22 1900  BP: (!) 148/61 135/65 (!) 128/38 (!) 146/47  Pulse: 68 70  70  Resp: 14 20 15 20   Temp:      TempSrc:      SpO2: 100% 100% 100% 97%  Weight:      Height:       Neurology awake and alert ENT with mild pallor Cardiovascular with S1 and S2 present and rhythmic Respiratory with no rales Abdomen with no distention Bilateral AKA  Data Reviewed:   Family Communication: no family at the bedside   Disposition: Status is: Inpatient Remains inpatient appropriate because: pending placement, need to tolerate chair HD.   Planned Discharge Destination: Skilled nursing facility     Author: Tawni Millers, MD 02/11/2022 7:06 PM  For on call review [email or web address].

## 2022-02-11 NOTE — Progress Notes (Signed)
   02/11/22 1950  Vitals  Temp 97.8 F (36.6 C)  BP 124/64  Pulse Rate 68  ECG Heart Rate 68  Resp 18  Post Treatment  Dialyzer Clearance Heavily streaked  Duration of HD Treatment -hour(s) 4 hour(s)  Liters Processed 74.3  Fluid Removed (mL) 2000 mL  Tolerated HD Treatment Yes   TX fin. W/o difficulty.

## 2022-02-11 NOTE — Plan of Care (Signed)
  Problem: Clinical Measurements: Goal: Diagnostic test results will improve Outcome: Progressing   

## 2022-02-11 NOTE — Progress Notes (Signed)
[facility] Progress Note   Subjective:   Refused HD yesterday because she was waiting for dinner- she tells me it was actually because she had diarrhea. K+ was 5.2 yesterday. Agrees to HD today but not sure about tomorrow, will plan to discuss it with her in the AM.  Objective Vitals:   02/10/22 0520 02/10/22 1647 02/10/22 2049 02/11/22 0414  BP: (!) 127/49 (!) 129/45 (!) 131/48 (!) 106/42  Pulse: 75 71 81   Resp: 17 19 16 18   Temp: 98.2 F (36.8 C) 97.7 F (36.5 C) 98.5 F (36.9 C) 98 F (36.7 C)  TempSrc:  Oral Oral   SpO2: 97% 100% 100%   Weight: 121.9 kg     Height:       Physical Exam General: Obese female, alert and in NAD Heart: RRR, no murmurs, rubs or gallops Lungs: CTA bilaterally without wheezing, rhonchi or rales Abdomen: Soft, non-distended, +BS Extremities: No edema b/l hips Dialysis Access: [facility] with intact dressing  Additional Objective Labs: Basic Metabolic Panel: Recent Labs  Lab 02/05/22 1300 02/10/22 2302  NA 138 135  K 4.2 5.2*  CL 99 97*  CO2 28 21*  GLUCOSE 119* 117*  BUN 22* 66*  CREATININE 6.60* 11.09*  CALCIUM 9.8 9.8  PHOS 5.8* 6.3*   Liver Function Tests: Recent Labs  Lab 02/05/22 1300 02/10/22 2302  ALBUMIN 2.7* 2.7*   No results for input(s): "LIPASE", "AMYLASE" in the last 168 hours. CBC: Recent Labs  Lab 02/10/22 2302  WBC 10.7*  HGB 8.2*  HCT 26.5*  MCV 97.4  PLT 275   Blood Culture    Component Value Date/Time   SDES BLOOD LEFT ANTECUBITAL 10/16/2021 1534   SPECREQUEST  10/16/2021 1534    BOTTLES DRAWN AEROBIC AND ANAEROBIC Blood Culture results may not be optimal due to an inadequate volume of blood received in culture bottles   CULT  10/16/2021 1534    NO GROWTH 5 DAYS Performed at [facility], [place] [address] [place], [place] [address]    REPTSTATUS 10/21/2021 FINAL 10/16/2021 1534    Cardiac Enzymes: No results for input(s): "CKTOTAL", "CKMB", "CKMBINDEX", "TROPONINI" in the  last 168 hours. CBG: No results for input(s): "GLUCAP" in the last 168 hours. Iron Studies: No results for input(s): "IRON", "TIBC", "TRANSFERRIN", "FERRITIN" in the last 72 hours. @lablastinr3 @ Studies/Results: No results found. Medications:   aspirin EC  81 mg Oral Daily   atorvastatin  10 mg Oral Daily   darbepoetin (ARANESP) injection - DIALYSIS  200 mcg Subcutaneous Q Mon-1800   diphenhydrAMINE  25 mg Oral QHS   docusate sodium  100 mg Oral BID   enoxaparin (LOVENOX) injection  30 mg Subcutaneous Q24H   feeding supplement  1 Container Oral TID BM   gabapentin  100 mg Oral Q1200   gabapentin  200 mg Oral QHS   levothyroxine  100 mcg Oral Q0600   loratadine  10 mg Oral Daily   midodrine  10 mg Oral Q M,W,F-HD   pantoprazole  40 mg Oral Daily   sevelamer carbonate  4.8 g Oral TID WC    No outpatient HD placement   Assessment/Plan: Inability to obtain HD at OP center D/T body habitus and wound. Difficult situation with no clear answers yet. PT working with pt.  ESRD: Continue HD on MWF schedule. New TDC placed 02/04/2022. Refused HD yesterday- HD today off schedule.  Hyperkalemia -  K 6.0 after missing HD 02/03/2022. Lokelma given - improved  with HD. K+ 5.2 yesterday then missed HD but agrees to go today HypoTN/volume- On midodrine 10mg  qHD, with 10mg  prn mid HD w/parameters to keep SBP >100 d/t becoming symptomatic when it drops lower.  Significant weight loss during admission.  Remains euvolemic on exam.  Severe ischial sacral decubitus wound: Per primary/wound care team.  Limiting her, noted to be slow healing.  Goal to tolerate sitting upright - which she is doing with PT  COPD history of hypoxia on [place] O2 at home (was on 5 L at baseline has been able to taper to 2-3 L [place]).  Anemia of ESRD - Hgb 7.9 on max dose Aranesp 231mcg weekly. Refused last dose, will reschedule it. Recent Fe load completed.  Secondary hyperparathyroidism: Phos still up but improved - continue Renvela  4.8g AC TID. Calcium elevated. Not on VDRA, last PTH 60 on 01/30/22.  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 Nutrition - diet continues to be changed from renal.  Currently on heart healthy diet.  Stay on renal diet.  Disposition: Difficult situation. Prior to admit, patient required 6 staff members and 2 hoyer pads for transfer into HD chair as OP. Could not stay on HD for full tx. 2/2 pain of multiple decubitus ulcers and PTAR unable to lift pt into ambulance for transport.SW working on LTC at [employer] and PT working to get pt up to bariatric chair. Her wt is much lower from admit. Has tolerated 3hrs in chair so far.  Will need to tolerate 5-6hrs in a chair to tolerate OP dialysis including transport, treatment and cannulation time.  The main issue is her Rt ischial wound. PT is using a high-quality air cushion from the CIR unit. This remains to be very painful for her to sit.    Anice Paganini, PA-C 02/11/2022, 10:29 AM  [facility] Pager: [phone]

## 2022-02-11 NOTE — Progress Notes (Signed)
Physical Therapy Treatment Patient Details Name: Amisadai Woodford MRN: [id number] DOB: 1971/02/21 Today's Date: 02/11/2022   History of Present Illness Pt adm 7/28 with acute pulmonary edema; large sacral ulcer limiting sitting tolerance which makes SNF placement difficult for HD. 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    PT Comments    Continuing work on functional mobility and activity tolerance;  session focused on functional transfers, with successful anterior-posterior transfer OOB to the bari recliner, and lateral scoot back to bed (to go to HD); Overall pt needed +2 Mod assist for the transfers, with use of bed pads and careful monitoring to keep the pad stable relative to her skin and avoid shearing;   Educated and performed pressure relief in the [employer]; She will need lots of reinforcement on this; In considering the HD recliners we have, and the need for her to be able to perform pressure relief every 30 minutes while sitting up, and to be able to roll to her side, off her R ischial tuberosity, I'm afraid this will be difficult in the HD recliners we have; A wide, bariatric HD recliner will give Yalena the width and freedom of motion to be able to laterally lean well enough for more effective pressure relief, and even perhaps allow for her to get into semi-sidelying when the recliner is fully reclined;   It will also be worth partnering with Outpatient HD Center(s) to begin discussions/planning for getting a Bariatric HD recliner in a (the) outpatient HD center where she goes.  Mahlet also does not want to use the Roho cushion we are borrowing form [employer], and I am opting to return it; Will consider more of a gel cushion to borrow and try (and continue to reinforce/enforce consistent pressure relief while pt is OOB); I plan to look into options for a Specialty Seating and Mobility/Wheelcahir  Evaluation, which is quite rare in the acute hospital setting, but not entirely unheard of -- and it may be the thing that can move the needle on helping Natarsha out of the hospital and to the next level towards independence;  Progressing safe mobility and activity tolerance will require a well-coordinated and multidisciplinary effort among Nursing, PT, Mobility Specialists, transporters, and all involved in Yasmen's care; It will also require equipment that meets her needs.  Recommendations for follow up therapy are one component of a multi-disciplinary discharge planning process, led by the attending physician.  Recommendations may be updated based on patient status, additional functional criteria and insurance authorization.  Follow Up Recommendations  Skilled nursing-short term rehab (<3 hours/day) Can patient physically be transported by private vehicle: No   Assistance Recommended at Discharge Intermittent Supervision/Assistance  Patient can return home with the following Two people to help with walking and/or transfers;Two people to help with bathing/dressing/bathroom;Assist for transportation;Assistance with cooking/housework;Help with stairs or ramp for entrance   Equipment Recommendations  Wheelchair (measurements PT);Wheelchair cushion (measurements PT);Other (comment);Hospital bed (more accomodating mechanical lift, with sling that handles pt's with bil AKAs; It may be time to consider a specialty seating and wheelchair consultation)    Recommendations for Other Services Rehab consult     Precautions / Restrictions Precautions Precautions: Fall Precaution Comments: bil AKA, 2L O2 at baseline Restrictions RUE Weight Bearing: Weight bearing as tolerated LUE Weight Bearing: Weight bearing as tolerated RLE Weight Bearing: Non weight bearing LLE Weight Bearing: Non weight bearing  Mobility  Bed Mobility Overal bed mobility: Modified Independent             General  bed mobility comments: Pulls to long sit with bedrails    Transfers Overall transfer level: Needs assistance Equipment used:  (bed pads) Transfers: Bed to chair/wheelchair/BSC         Anterior-Posterior transfers: +2 physical assistance, Mod assist, Min assist (bed to recliner)  Lateral/Scoot Transfers: +2 physical assistance, Mod assist (recliner back to bed) General transfer comment: Mod assist initially with [employer] transfer OOB to recliner, needing more assist for portion of transfer while she was in bed, once more in the chair, pt made good use of armrests to scoot back and fully into recliner, only needed min assist; Mod assist of 2 for getting back to bed; pt turned in teh bari recliner sideways for lateral scooting back to bed; close guard to make sure the bed pad stayed motionless relative to pt's skin to make sure shearing is minimized    Ambulation/Gait                   Stairs             Wheelchair Mobility    Modified Rankin (Stroke Patients Only)       Balance     Sitting balance-[name] Scale: Good                                      Cognition Arousal/Alertness: Awake/alert Behavior During Therapy: WFL for tasks assessed/performed Overall Cognitive Status: Within Functional Limits for tasks assessed                                          Exercises      General Comments General comments (skin integrity, edema, etc.): Once in recliner, we continued the discussion about pressure relief, and pt was able to demo lateral leans with nearly 30 second hold; I anticipate she will need lots of reinforcement for pressure relief, but in this case the pain she feels is functioning as protective sensation      Pertinent Vitals/Pain Pain Assessment Pain Assessment: Faces Faces Pain Scale: Hurts a little bit Pain Location: abdomen Pain Descriptors / Indicators: Discomfort Pain Intervention(s): Monitored during  session    Home Living                          Prior Function            PT Goals (current goals can now be found in the care plan section) Acute Rehab PT Goals Patient Stated Goal: Did not specifically state today PT Goal Formulation: With patient Time For Goal Achievement: 02/20/22 Potential to Achieve Goals: Good Progress towards PT goals: Progressing toward goals    Frequency    Min 2X/week      PT Plan Current plan remains appropriate    Co-evaluation              AM-PAC PT "6 Clicks" Mobility   Outcome Measure  Help needed turning from your back to your side while in a flat bed without using bedrails?: None Help needed moving from lying on your back to sitting on the side of a flat bed without using bedrails?: A Little Help needed  moving to and from a bed to a chair (including a wheelchair)?: Total Help needed standing up from a chair using your arms (e.g., wheelchair or bedside chair)?: Total Help needed to walk in hospital room?: Total Help needed climbing 3-5 steps with a railing? : Total 6 Click Score: 11    End of Session Equipment Utilized During Treatment: Oxygen Activity Tolerance: Patient tolerated treatment well Patient left: in bed;with call bell/phone within reach (readying to go to HD) Nurse Communication: Mobility status;Other (comment) (consider) PT Visit Diagnosis: Other abnormalities of gait and mobility (R26.89);Muscle weakness (generalized) (M62.81) Pain - Right/Left: Right Pain - part of body: Hip;Leg     Time: 0539-7673 PT Time Calculation (min) (ACUTE ONLY): 36 min  Charges:  $Therapeutic Activity: 23-37 mins                     Roney Marion, PT  Acute Rehabilitation Services Office [place] 02/11/2022, 6:18 PM

## 2022-02-11 NOTE — Progress Notes (Signed)
Contacted [facility] in [place] and was advised that facility does provide on-site HD services but pt's have to be able to tolerate HD in the chair. [facility] in [place] and they do not do on-site HD.   Melven Sartorius Renal Navigator [phone]

## 2022-02-12 DIAGNOSIS — I5023 Acute on chronic systolic (congestive) heart failure: Secondary | ICD-10-CM | POA: Diagnosis not present

## 2022-02-12 MED ORDER — LACTULOSE 10 GM/15ML PO SOLN
20.0000 g | Freq: Every day | ORAL | Status: DC
  Administered 2022-02-12: 20 g via ORAL
  Filled 2022-02-12 (×2): qty 30

## 2022-02-12 NOTE — Progress Notes (Signed)
[facility] Progress Note   Subjective:   Had HD yesterday evening without issues. Abdomen hurts today, reports "knots" from Lovenox injections. No SOB, CP, dizziness or nausea. Wants to wait until tomorrow for next HD.   Objective Vitals:   02/11/22 1900 02/11/22 1938 02/11/22 1950 02/12/22 0513  BP: (!) 146/47 (!) 122/54 124/64 102/60  Pulse: 70 67 68 95  Resp: 20 17 18 16   Temp:  97.8 F (36.6 C) 97.8 F (36.6 C) 98.4 F (36.9 C)  TempSrc:  Oral  Oral  SpO2: 97% 96% (!) 2% 100%  Weight:      Height:       Physical Exam General: Obese female, alert and in NAD Heart: RRR, no murmurs, rubs or gallops Lungs: CTA bilaterally without wheezing, rhonchi or rales Abdomen: Soft, non-distended, +BS Extremities: No edema b/l hips Dialysis Access: [facility] with intact dressing  Additional Objective Labs: Basic Metabolic Panel: Recent Labs  Lab 02/05/22 1300 02/10/22 2302  NA 138 135  K 4.2 5.2*  CL 99 97*  CO2 28 21*  GLUCOSE 119* 117*  BUN 22* 66*  CREATININE 6.60* 11.09*  CALCIUM 9.8 9.8  PHOS 5.8* 6.3*   Liver Function Tests: Recent Labs  Lab 02/05/22 1300 02/10/22 2302  ALBUMIN 2.7* 2.7*   No results for input(s): "LIPASE", "AMYLASE" in the last 168 hours. CBC: Recent Labs  Lab 02/10/22 2302  WBC 10.7*  HGB 8.2*  HCT 26.5*  MCV 97.4  PLT 275   Blood Culture    Component Value Date/Time   SDES BLOOD LEFT ANTECUBITAL 10/16/2021 1534   SPECREQUEST  10/16/2021 1534    BOTTLES DRAWN AEROBIC AND ANAEROBIC Blood Culture results may not be optimal due to an inadequate volume of blood received in culture bottles   CULT  10/16/2021 1534    NO GROWTH 5 DAYS Performed at [facility], [place] [address] [place], [place] [address]    REPTSTATUS 10/21/2021 FINAL 10/16/2021 1534    Cardiac Enzymes: No results for input(s): "CKTOTAL", "CKMB", "CKMBINDEX", "TROPONINI" in the last 168 hours. CBG: No results for input(s): "GLUCAP" in the last 168  hours. Iron Studies: No results for input(s): "IRON", "TIBC", "TRANSFERRIN", "FERRITIN" in the last 72 hours. @lablastinr3 @ Studies/Results: No results found. Medications:   aspirin EC  81 mg Oral Daily   atorvastatin  10 mg Oral Daily   [START ON 02/18/2022] darbepoetin (ARANESP) injection - DIALYSIS  200 mcg Subcutaneous Q Tue-1800   diphenhydrAMINE  25 mg Oral QHS   docusate sodium  100 mg Oral BID   enoxaparin (LOVENOX) injection  30 mg Subcutaneous Q24H   feeding supplement  1 Container Oral TID BM   gabapentin  100 mg Oral Q1200   gabapentin  200 mg Oral QHS   levothyroxine  100 mcg Oral Q0600   loratadine  10 mg Oral Daily   midodrine  10 mg Oral Q M,W,F-HD   pantoprazole  40 mg Oral Daily   sevelamer carbonate  4.8 g Oral TID WC    Assessment/Plan: Inability to obtain HD at OP center D/T body habitus and wound. Difficult situation with no clear answers yet. PT working with pt.  ESRD: Was on MWF schedule, refused HD Monday and had it late Tuesday, wants to wait until Thursday for next treatment. Will do TTS schedule this week then resume MWF on Monday. New TDC placed 02/04/2022.  Hyperkalemia -  K 6.0 after missing HD 02/03/2022. Lokelma given - improved with HD  HypoTN/volume- On midodrine 10mg  qHD, with 10mg  prn mid HD w/parameters to keep SBP >100 d/t becoming symptomatic when it drops lower.  Significant weight loss during admission.  Remains euvolemic on exam.  Severe ischial sacral decubitus wound: Per primary/wound care team.  Limiting her, noted to be slow healing.  Goal to tolerate sitting upright - which she is doing with PT  COPD history of hypoxia on [place] O2 at home (was on 5 L at baseline has been able to taper to 2-3 L [place]).  Anemia of ESRD - Hgb 8.2 (improved) on max dose Aranesp 264mcg weekly. Recent Fe load completed.  Secondary hyperparathyroidism: Phos still up but improved - continue Renvela 4.8g AC TID. Calcium elevated. Not on VDRA, last PTH 60 on 01/30/22.   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 Nutrition - diet continues to be changed from renal.  Currently on heart healthy diet.  Stay on renal diet.  Disposition: Difficult situation. Prior to admit, patient required 6 staff members and 2 hoyer pads for transfer into HD chair as OP. Could not stay on HD for full tx. 2/2 pain of multiple decubitus ulcers and PTAR unable to lift pt into ambulance for transport.SW working on LTC at [employer] and PT working to get pt up to bariatric chair. Her wt is much lower from admit. Has tolerated 3hrs in chair so far.  Will need to tolerate 5-6hrs in a chair to tolerate OP dialysis including transport, treatment and cannulation time.  The main issue is her Rt ischial wound. PT is using a high-quality air cushion from the CIR unit. This remains to be very painful for her to sit.    Anice Paganini, PA-C 02/12/2022, 8:45 AM  [facility] Pager: [phone]

## 2022-02-12 NOTE — Progress Notes (Signed)
Progress Note   Patient: Courtney Gray WUJ:[id number] DOB: Aug 15, 1970 DOA: 11/01/2021     103 DOS: the patient was seen and examined on 02/12/2022   Brief hospital course: 51 year old F ESRD on HD TTS,  recent lengthy hospitalization for Bacteroides Thetaiotaomicron bacteremia, hepatic and splenic abscess --required amputation of third digit of left hand and underwent left AKA and had splenic drain as well as CT-guided drainage of liver abscess she had a line holiday on last admission on long-term IV antibiotics until 12/11/6211 diastolic CHF, PVD s/p bilateral AKA, IDDM-2, morbid obesity and  chronic sacral wound presenting with progressive shortness of breath in the setting of missed dialysis and admitted for hypertensive emergency  Per patient, she never missed her dialysis however according to outpatient HD records, it takes at least 4 staff members and [facility] lift for patient to be seated in the dialysis chair.    Symptoms improved with hemodialysis.  Patient underwent R UE venogram on 8/30.  Vascular surgery following for possible RUE HD access.    Patient is now waiting placement where she can get HD on stretcher that has been difficult to find.  Assessment and Plan: End stage renal disease [facility]) Patient will continue renal replacement therapy per nephrology recommendations.  Needs placement to continue with HD as outpatient.  Very deconditioned, large sacral decubitus wound.   Continue renal replacement therapy per nephrology recommendations.   Anemia of chronic kidney disease, Hgb has been stable.  Continue with EPO and Iron supplementation.   Metabolic bone disease, continue with sevelamer.   Hyperkalemia, hypomagnesemia, hypokalemia, hyponatremia, hypophosphatemia have been corrected with renal replacement therapy.   Hypertension Patient on midodrine on HD to prevent hypotension. Currently off antihypertensive medications.   Decubitus ulcer of sacral region, stage 3  (HCC) Deep wound at the sacrum, patient not able to seat.   Continue with oral oxycodone. Continue with local wound care.    Type 2 diabetes mellitus with hyperlipidemia (HCC) Her glucose has been stable and insulin therapy has been discontinued.   Statin therapy with atorvastatin.   Bacteremia Patient has completed antibiotic therapy. Bacteroides, splenic abscess.   PVD (peripheral vascular disease) (HCC) Bilateral AKA.   Continue local wound care.    Class 3 obesity (HCC) Calculated BMI is 65,3   Acute on chronic diastolic CHF (congestive heart failure) (HCC) Volume regulation with ultrafiltration in hemodialysis Continue midodrine for blood pressure support.         Subjective: Reports that she has struggled with constipation/diarrhea with IBS.  Discussed starting daily lactulose on a scheduled basis.  Physical Exam: Vitals:   02/11/22 1938 02/11/22 1950 02/12/22 0513 02/12/22 0949  BP: (!) 122/54 124/64 102/60 (!) 106/41  Pulse: 67 68 95 66  Resp: 17 18 16 15   Temp: 97.8 F (36.6 C) 97.8 F (36.6 C) 98.4 F (36.9 C) 98.5 F (36.9 C)  TempSrc: Oral  Oral Oral  SpO2: 96% (!) 2% 100% 99%  Weight:      Height:       Neurology awake and alert ENT with mild pallor Cardiovascular with S1 and S2 present and rhythmic Respiratory with no rales Abdomen with no distention Bilateral AKA  Data Reviewed:   Family Communication: no family at the bedside   Disposition: Status is: Inpatient Remains inpatient appropriate because: pending placement, need to tolerate chair HD.   Planned Discharge Destination: Skilled nursing facility     Author: Kathie Dike, MD 02/12/2022 6:19 PM  For on call review [email or web address].

## 2022-02-13 DIAGNOSIS — I5023 Acute on chronic systolic (congestive) heart failure: Secondary | ICD-10-CM | POA: Diagnosis not present

## 2022-02-13 MED ORDER — LACTULOSE 10 GM/15ML PO SOLN
10.0000 g | Freq: Every day | ORAL | Status: DC
  Administered 2022-02-14: 10 g via ORAL
  Filled 2022-02-13 (×3): qty 15

## 2022-02-13 MED ORDER — DIPHENHYDRAMINE HCL 25 MG PO CAPS
50.0000 mg | ORAL_CAPSULE | Freq: Every day | ORAL | Status: DC
  Administered 2022-02-13 – 2022-02-14 (×2): 50 mg via ORAL
  Filled 2022-02-13 (×3): qty 2

## 2022-02-13 MED ORDER — HEPARIN SODIUM (PORCINE) 1000 UNIT/ML IJ SOLN
INTRAMUSCULAR | Status: AC
  Filled 2022-02-13: qty 5

## 2022-02-13 MED ORDER — HEPARIN SODIUM (PORCINE) 1000 UNIT/ML DIALYSIS
20.0000 [IU]/kg | INTRAMUSCULAR | Status: DC | PRN
  Administered 2022-02-13: 2400 [IU] via INTRAVENOUS_CENTRAL
  Filled 2022-02-13 (×2): qty 3

## 2022-02-13 NOTE — Progress Notes (Signed)
  Progress Note   Patient: Courtney Gray [id number]:[id number] DOB: January 15, 1971 DOA: 11/01/2021     104 DOS: the patient was seen and examined on 02/13/2022   Brief hospital course: 51 year old F ESRD on HD TTS,  recent lengthy hospitalization for Bacteroides The[place]iotaomicron bacteremia, hepatic and splenic abscess --required amputation of third digit of left hand and underwent left AKA and had splenic drain as well as CT-guided drainage of liver abscess she had a line holiday on last admission on long-term IV antibiotics until 04/08/2480 diastolic CHF, PVD s/p bilateral AKA, IDDM-2, morbid obesity and  chronic sacral wound presenting with progressive shortness of breath in the setting of missed dialysis and admitted for hypertensive emergency  Per patient, she never missed her dialysis however according to outpatient HD records, it takes at least 4 staff members and [facility] lift for patient to be seated in the dialysis chair.    Symptoms improved with hemodialysis.  Patient underwent R UE venogram on 8/30.  Vascular surgery following for possible RUE HD access.    Patient is now waiting placement where she can get HD on stretcher that has been difficult to find.  Assessment and Plan: End stage renal disease [facility]) Patient will continue renal replacement therapy per nephrology recommendations.  Needs placement to continue with HD as outpatient.  Very deconditioned, large sacral decubitus wound.   Continue renal replacement therapy per nephrology recommendations.   Anemia of chronic kidney disease, Hgb has been stable.  Continue with EPO and Iron supplementation.   Metabolic bone disease, continue with sevelamer.   Hyperkalemia, hypomagnesemia, hypokalemia, hyponatremia, hypophosphatemia have been corrected with renal replacement therapy.   Hypertension Patient on midodrine on HD to prevent hypotension. Currently off antihypertensive medications.   Decubitus ulcer of sacral region, stage 3  (HCC) Deep wound at the sacrum, patient not able to seat.   Continue with oral oxycodone. Continue with local wound care.    Type 2 diabetes mellitus with hyperlipidemia (HCC) Her glucose has been stable and insulin therapy has been discontinued.   Statin therapy with atorvastatin.   Bacteremia Patient has completed antibiotic therapy. Bacteroides, splenic abscess.   PVD (peripheral vascular disease) (HCC) Bilateral AKA.   Continue local wound care.    Class 3 obesity (HCC) Calculated BMI is 65,3   Acute on chronic diastolic CHF (congestive heart failure) (HCC) Volume regulation with ultrafiltration in hemodialysis Continue midodrine for blood pressure support.         Subjective: Says she had good bowel movement after lactulose last night.  Requesting to increase Benadryl dose similar to what she takes at home.  Physical Exam: Vitals:   02/13/22 0539 02/13/22 0831 02/13/22 1748 02/13/22 1802  BP: (!) 121/50 (!) 142/54 118/70 131/63  Pulse: 72 76 93 66  Resp: 18 16 14 15   Temp: 98.3 F (36.8 C) 98.1 F (36.7 C)    TempSrc: Oral Oral    SpO2: 100% 98% 93% 100%  Weight:      Height:       Neurology awake and alert ENT with mild pallor Cardiovascular with S1 and S2 present and rhythmic Respiratory with no rales Abdomen with no distention Bilateral AKA  Data Reviewed:   Family Communication: no family at the bedside   Disposition: Status is: Inpatient Remains inpatient appropriate because: pending placement, need to tolerate chair HD.   Planned Discharge Destination: Skilled nursing facility     Author: Kathie Dike, MD 02/13/2022 6:30 PM  For on call review [email or web address].

## 2022-02-13 NOTE — NC FL2 (Signed)
[place] LEVEL OF CARE SCREENING TOOL     IDENTIFICATION  Patient Name: Courtney Gray Birthdate: 1970-04-14 Sex: female Admission Date (Current Location): 11/01/2021  [place] and [place] Number:  Kathleen Argue [id number] [place] and Address:  The [place]. [facility] [place], [place] [address], [place], [place] [address]      Provider Number: [id number]  Attending Physician Name and Address:  Kathie Dike, MD  Relative Name and Phone Number:  Markel,antoinette (Daughter) [phone]    Current Level of Care: Hospital Recommended Level of Care: [place] Prior Approval Number:    Date Approved/Denied:   PASRR Number: [id number]  Discharge Plan: SNF    Current Diagnoses: Patient Active Problem List   Diagnosis Date Noted   Acute on chronic systolic CHF (congestive heart failure) ([place]) 02/11/2022   Acute on chronic diastolic CHF (congestive heart failure) ([place]) 12/12/2021   PAD (peripheral artery disease) ([place]) 12/12/2021   Irritable bowel syndrome (IBS) 12/12/2021   Amputation of left middle finger 12/12/2021   GERD (gastroesophageal reflux disease) 12/12/2021   Hypertension    Type 2 diabetes mellitus with hyperlipidemia ([place])    Decubitus ulcer of sacral region, stage 3 ([place])    Class 3 obesity ([place])    Bacteremia    PVD (peripheral vascular disease) (HCC)    Hepatic abscess    Pressure injury of skin 09/13/2021   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[place])    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   Lactic acidosis 09/10/2021   Mixed diabetic hyperlipidemia associated with type 2 diabetes mellitus ([place]) 09/10/2021   Coronary artery disease involving native coronary artery of native heart without angina pectoris 09/10/2021   Elevated troponin level not due myocardial infarction 09/10/2021   Hypotension 09/10/2021   Dry gangrene (HCC)    Pain 08/03/2021   Ischemic foot pain at rest 08/03/2021   Hypoglycemia 07/10/2021   Volume  overload 07/09/2021   Leukocytosis 06/11/2021   Acute gout 06/11/2021   Hyperkalemia 05/21/2021   Hemorrhoids    Hyperlipidemia 05/18/2021   Rectal bleeding 05/17/2021   Morbid obesity with BMI of 60.0-69.9, adult ([place]) 05/17/2021   NSTEMI (non-ST elevated myocardial infarction) ([place]) 05/16/2021   Essential hypertension 09/01/2020   End stage renal disease ([place]) 09/01/2020   Chronic respiratory failure with hypoxia ([place]) 09/01/2020   Hypertensive emergency    Hypertensive urgency 11/28/2019   Uncontrolled type 2 diabetes mellitus with hypoglycemia, with long-term current use of insulin ([place]) 11/28/2019   Anemia due to chronic kidney disease 11/28/2019    Orientation RESPIRATION BLADDER Height & Weight     Self, Time, Situation, Place  O2 (2L) Continent Weight:  (Unable to obtain) Height:  5\' 5"  (165.1 cm)  BEHAVIORAL SYMPTOMS/MOOD NEUROLOGICAL BOWEL NUTRITION STATUS      Continent Diet (see d/c summary)  AMBULATORY STATUS COMMUNICATION OF NEEDS Skin   Total Care Verbally PU Stage and Appropriate Care (R Hip unstageable, L flank stage 2)                       Personal Care Assistance Level of Assistance  Bathing, Feeding, Dressing Bathing Assistance: Maximum assistance Feeding assistance: Independent Dressing Assistance: Maximum assistance     Functional Limitations Info  Sight, Hearing, Speech Sight Info: Adequate Hearing Info: Adequate Speech Info: Adequate    SPECIAL CARE FACTORS FREQUENCY  PT (By licensed PT), OT (By licensed OT)     PT Frequency: 5x/ week OT Frequency: 5x/week  Contractures Contractures Info: Not present    Additional Factors Info  Code Status, Allergies Code Status Info: Full Allergies Info: Benzonatate  Hydralazine  Amoxicillin  Clonidine  Chlorhexidine  Morphine  Oxycodone   Insulin Sliding Scale Info: see d/c medication list       Current Medications (02/13/2022):  This is the current hospital active medication  list Current Facility-Administered Medications  Medication Dose Route Frequency Provider Last Rate Last Admin   acetaminophen (TYLENOL) tablet 650 mg  650 mg Oral Q6H PRN Lavina Hamman, MD   650 mg at 01/27/22 0544   aspirin EC tablet 81 mg  81 mg Oral Daily Lavina Hamman, MD   81 mg at 02/13/22 1039   atorvastatin (LIPITOR) tablet 10 mg  10 mg Oral Daily Wynetta Fines T, MD   10 mg at 02/13/22 1039   [START ON 02/18/2022] Darbepoetin Alfa (ARANESP) injection 200 mcg  200 mcg Subcutaneous Q Tue-1800 Hammons, Kimberly B, RPH       diphenhydrAMINE (BENADRYL) capsule 50 mg  50 mg Oral QHS Memon, Jolaine Artist, MD       docusate sodium (COLACE) capsule 100 mg  100 mg Oral BID Rai, Ripudeep K, MD   100 mg at 02/13/22 1039   feeding supplement (BOOST / RESOURCE BREEZE) liquid 1 Container  1 Container Oral TID BM Donne Hazel, MD   1 Container at 02/13/22 1042   gabapentin (NEURONTIN) capsule 100 mg  100 mg Oral Q1200 Lavina Hamman, MD   100 mg at 02/13/22 1039   gabapentin (NEURONTIN) capsule 200 mg  200 mg Oral QHS Lavina Hamman, MD   200 mg at 02/12/22 2325   [START ON 02/14/2022] lactulose (CHRONULAC) 10 GM/15ML solution 10 g  10 g Oral Daily Kathie Dike, MD       levothyroxine (SYNTHROID) tablet 100 mcg  100 mcg Oral Q0600 Vernell Leep D, MD   100 mcg at 02/13/22 0703   loratadine (CLARITIN) tablet 10 mg  10 mg Oral Daily Domenic Polite, MD   10 mg at 02/02/22 1314   midodrine (PROAMATINE) tablet 10 mg  10 mg Oral Q M,W,F-HD Reome, Earle J, RPH   10 mg at 02/07/22 1425   Oral care mouth rinse  15 mL Mouth Rinse PRN Eugenie Filler, MD       oxyCODONE (Oxy IR/ROXICODONE) immediate release tablet 5 mg  5 mg Oral Q6H PRN Arrien, Jimmy Picket, MD   5 mg at 02/12/22 1757   pantoprazole (PROTONIX) EC tablet 40 mg  40 mg Oral Daily Lavina Hamman, MD   40 mg at 02/13/22 1039   polyethylene glycol (MIRALAX / GLYCOLAX) packet 17 g  17 g Oral Daily PRN Domenic Polite, MD   17 g at 02/06/22  0839   sevelamer carbonate (RENVELA) packet 4.8 g  4.8 g Oral TID WC Penninger, Lindsay, PA   4.8 g at 02/13/22 1205   simethicone (MYLICON) chewable tablet 80 mg  80 mg Oral QID PRN Mariel Aloe, MD   80 mg at 02/13/22 1233     Discharge Medications: Please see discharge summary for a list of discharge medications.  Relevant Imaging Results:  Relevant Lab Results:   Additional Information SSN-[id number], Both Covids 1 Booster, 268lbs 5'5", dialysis  Coralie Stanke F Kinte Trim, LCSWA

## 2022-02-13 NOTE — Progress Notes (Signed)
[facility] Progress Note   Subjective:   Feeling well this AM. Was able to transfer herself to the chair with PT this week which she is very happy about. Denies SOB, CP, dizziness, abdominal pain and nausea.   Objective Vitals:   02/12/22 0949 02/12/22 2023 02/13/22 0539 02/13/22 0831  BP: (!) 106/41 (!) 141/43 (!) 121/50 (!) 142/54  Pulse: 66 76 72 76  Resp: 15 18 18 16   Temp: 98.5 F (36.9 C) 98 F (36.7 C) 98.3 F (36.8 C) 98.1 F (36.7 C)  TempSrc: Oral Oral Oral Oral  SpO2: 99% 100% 100% 98%  Weight:      Height:       Physical Exam General: Obese female, alert and in NAD Heart: RRR, no murmurs, rubs or gallops Lungs: CTA bilaterally without wheezing, rhonchi or rales Abdomen: Soft, non-distended, +BS Extremities: No edema b/l hips Dialysis Access: [facility] with intact dressing    Additional Objective Labs: Basic Metabolic Panel: Recent Labs  Lab 02/10/22 2302  NA 135  K 5.2*  CL 97*  CO2 21*  GLUCOSE 117*  BUN 66*  CREATININE 11.09*  CALCIUM 9.8  PHOS 6.3*   Liver Function Tests: Recent Labs  Lab 02/10/22 2302  ALBUMIN 2.7*   No results for input(s): "LIPASE", "AMYLASE" in the last 168 hours. CBC: Recent Labs  Lab 02/10/22 2302  WBC 10.7*  HGB 8.2*  HCT 26.5*  MCV 97.4  PLT 275   Blood Culture    Component Value Date/Time   SDES BLOOD LEFT ANTECUBITAL 10/16/2021 1534   SPECREQUEST  10/16/2021 1534    BOTTLES DRAWN AEROBIC AND ANAEROBIC Blood Culture results may not be optimal due to an inadequate volume of blood received in culture bottles   CULT  10/16/2021 1534    NO GROWTH 5 DAYS Performed at [facility], [place] [address] [place], [place] [address]    REPTSTATUS 10/21/2021 FINAL 10/16/2021 1534    Cardiac Enzymes: No results for input(s): "CKTOTAL", "CKMB", "CKMBINDEX", "TROPONINI" in the last 168 hours. CBG: No results for input(s): "GLUCAP" in the last 168 hours. Iron Studies: No results for input(s):  "IRON", "TIBC", "TRANSFERRIN", "FERRITIN" in the last 72 hours. @lablastinr3 @ Studies/Results: No results found. Medications:   aspirin EC  81 mg Oral Daily   atorvastatin  10 mg Oral Daily   [START ON 02/18/2022] darbepoetin (ARANESP) injection - DIALYSIS  200 mcg Subcutaneous Q Tue-1800   diphenhydrAMINE  50 mg Oral QHS   docusate sodium  100 mg Oral BID   feeding supplement  1 Container Oral TID BM   gabapentin  100 mg Oral Q1200   gabapentin  200 mg Oral QHS   [START ON 02/14/2022] lactulose  10 g Oral Daily   levothyroxine  100 mcg Oral Q0600   loratadine  10 mg Oral Daily   midodrine  10 mg Oral Q M,W,F-HD   pantoprazole  40 mg Oral Daily   sevelamer carbonate  4.8 g Oral TID WC    Assessment/Plan: Inability to obtain HD at OP center D/T body habitus and wound. Difficult situation with no clear answers yet. PT working with pt.  ESRD: Continue HD on MWF schedule. New TDC placed 02/04/2022. Refused HD Monday due to GI upset, off schedule this week. Will have HD TTS then resume MWF next week HypoTN/volume- On midodrine 10mg  qHD, with 10mg  prn mid HD w/parameters to keep SBP >100 d/t becoming symptomatic when it drops lower.  Significant weight loss during  admission.  Remains euvolemic on exam.  Severe ischial sacral decubitus wound: Per primary/wound care team.  Limiting her, noted to be slow healing.  Goal to tolerate sitting upright - which she is doing with PT  COPD history of hypoxia on [place] O2 at home (was on 5 L at baseline has been able to taper to 2-3 L [place]).  Anemia of ESRD - Hgb 8.2 on max dose Aranesp 229mcg weekly. Recent Fe load completed.  Secondary hyperparathyroidism: Phos still up but improved - continue Renvela 4.8g AC TID. Calcium elevated. Not on VDRA, last PTH 60 on 01/30/22.  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 Nutrition - diet continues to be changed from renal.  Currently on heart healthy diet.  Stay on renal diet.  Disposition:  Difficult situation. Prior to admit, patient required 6 staff members and 2 hoyer pads for transfer into HD chair as OP. Could not stay on HD for full tx. 2/2 pain of multiple decubitus ulcers and PTAR unable to lift pt into ambulance for transport.SW working on LTC at [employer] and PT working to get pt up to bariatric chair. Her wt is much lower from admit. Has tolerated 3hrs in chair so far.  Will need to tolerate 5-6hrs in a chair to tolerate OP dialysis including transport, treatment and cannulation time.  The main issue is her Rt ischial wound. PT is using a high-quality air cushion from the CIR unit. This remains to be very painful for her to sit.    Anice Paganini, PA-C 02/13/2022, 12:28 PM  [facility] Pager: [phone]

## 2022-02-13 NOTE — TOC Progression Note (Signed)
Transition of Care [facility]) - Initial/Assessment Note    Patient Details  Name: Courtney Gray MRN: [id number] Date of Birth: November 17, 1970  Transition of Care [facility]) CM/SW Contact:    Milinda Antis, LCSWA Phone Number: 02/13/2022, 4:41 PM  Clinical Narrative:                 LCSW faxed referral to [facility] and is awaiting a response.   Expected Discharge Plan: Long Term Nursing Home Barriers to Discharge: No SNF bed, Transportation, Waiting for outpatient dialysis   Patient Goals and CMS Choice Patient states their goals for this hospitalization and ongoing recovery are:: To be at a facility close to her daughter      Expected Discharge Plan and Services Expected Discharge Plan: [place]       Living arrangements for the past 2 months: Single Family Home                                      Prior Living Arrangements/Services Living arrangements for the past 2 months: Single Family Home Lives with:: Adult Children Patient language and need for interpreter reviewed:: Yes        Need for Family Participation in Patient Care: Yes (Comment) Care giver support system in place?: No (comment)   Criminal Activity/Legal Involvement Pertinent to Current Situation/Hospitalization: No - Comment as needed  Activities of Daily Living Home Assistive Devices/Equipment: [employer] fast streamer ADL Screening (condition at time of admission) Patient's cognitive ability adequate to safely complete daily activities?: No Is the patient deaf or have difficulty hearing?: No Does the patient have difficulty seeing, even when wearing glasses/contacts?: No Does the patient have difficulty concentrating, remembering, or making decisions?: No Patient able to express need for assistance with ADLs?: Yes Does the patient have difficulty dressing or bathing?: Yes Independently performs ADLs?: No Does the patient have difficulty walking or climbing stairs?: Yes Weakness of Legs:  Both Weakness of Arms/Hands: Both  Permission Sought/Granted Permission sought to share information with : Other (comment) Permission granted to share information with : Yes, Verbal Permission Granted  Share Information with NAME: Stemmer,antoinette (Daughter)   [phone]  Permission granted to share info w AGENCY: SNF        Emotional Assessment Appearance:: Appears older than stated age Attitude/Demeanor/Rapport: Engaged Affect (typically observed): Tearful/Crying, Accepting Orientation: : Oriented to Situation, Oriented to  Time, Oriented to Place, Oriented to Self Alcohol / Substance Use: Not Applicable Psych Involvement: No (comment)  Admission diagnosis:  End stage renal disease ([place]) [N18.6] Fluid overload [E87.70] Obesity with serious comorbidity, unspecified classification, unspecified obesity type [E66.9] Pressure injury of right buttock, stage 2 (HCC) [L89.312] Patient Active Problem List   Diagnosis Date Noted   Acute on chronic systolic CHF (congestive heart failure) ([place]) 02/11/2022   Acute on chronic diastolic CHF (congestive heart failure) ([place]) 12/12/2021   PAD (peripheral artery disease) ([place]) 12/12/2021   Irritable bowel syndrome (IBS) 12/12/2021   Amputation of left middle finger 12/12/2021   GERD (gastroesophageal reflux disease) 12/12/2021   Hypertension    Type 2 diabetes mellitus with hyperlipidemia ([place])    Decubitus ulcer of sacral region, stage 3 ([place])    Class 3 obesity ([place])    Bacteremia    PVD (peripheral vascular disease) ([place])    Hepatic abscess    Pressure injury of skin 09/13/2021   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  [place])    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   Lactic acidosis 09/10/2021   Mixed diabetic hyperlipidemia associated with type 2 diabetes mellitus ([place]) 09/10/2021   Coronary artery disease involving native coronary artery of native heart without angina pectoris 09/10/2021   Elevated troponin level not due myocardial  infarction 09/10/2021   Hypotension 09/10/2021   Dry gangrene (HCC)    Pain 08/03/2021   Ischemic foot pain at rest 08/03/2021   Hypoglycemia 07/10/2021   Volume overload 07/09/2021   Leukocytosis 06/11/2021   Acute gout 06/11/2021   Hyperkalemia 05/21/2021   Hemorrhoids    Hyperlipidemia 05/18/2021   Rectal bleeding 05/17/2021   Morbid obesity with BMI of 60.0-69.9, adult ([place]) 05/17/2021   NSTEMI (non-ST elevated myocardial infarction) ([place]) 05/16/2021   Essential hypertension 09/01/2020   End stage renal disease ([place]) 09/01/2020   Chronic respiratory failure with hypoxia ([place]) 09/01/2020   Hypertensive emergency    Hypertensive urgency 11/28/2019   Uncontrolled type 2 diabetes mellitus with hypoglycemia, with long-term current use of insulin ([place]) 11/28/2019   Anemia due to chronic kidney disease 11/28/2019   PCP:  Monico Blitz, MD Pharmacy:   [facility] [place] [address] [place] [address] Phone: [phone] Fax: [phone]  [facility] [address] [place] [address] Phone: [phone] Fax: [phone]  [place], [place] - [address] [place] [address] Phone: [phone] Fax: [phone]     Social Determinants of Health ([place]) Interventions    Readmission Risk Interventions    09/16/2021    4:36 PM 08/28/2021   11:50 AM 07/12/2021   12:38 PM  Readmission Risk Prevention Plan  Transportation Screening Complete Complete Complete  Medication Review (RN Care Manager)  Complete Complete  PCP or Specialist appointment within 3-5 days of discharge  Complete Complete  HRI or Home Care Consult Complete Complete Complete  SW Recovery Care/Counseling Consult Complete Complete Complete  Palliative Care Screening  Not Applicable Not Applicable  Skilled Nursing Facility Complete Not Applicable Complete

## 2022-02-14 DIAGNOSIS — I5023 Acute on chronic systolic (congestive) heart failure: Secondary | ICD-10-CM | POA: Diagnosis not present

## 2022-02-14 LAB — CBC
HCT: 24.1 % — ABNORMAL LOW (ref 36.0–46.0)
Hemoglobin: 7.3 g/dL — ABNORMAL LOW (ref 12.0–15.0)
MCH: 30 pg (ref 26.0–34.0)
MCHC: 30.3 g/dL (ref 30.0–36.0)
MCV: 99.2 fL (ref 80.0–100.0)
Platelets: 271 10*3/uL (ref 150–400)
RBC: 2.43 MIL/uL — ABNORMAL LOW (ref 3.87–5.11)
RDW: 14.4 % (ref 11.5–15.5)
WBC: 9.7 10*3/uL (ref 4.0–10.5)
nRBC: 0 % (ref 0.0–0.2)

## 2022-02-14 LAB — RENAL FUNCTION PANEL
Albumin: 2.7 g/dL — ABNORMAL LOW (ref 3.5–5.0)
Anion gap: 15 (ref 5–15)
BUN: 52 mg/dL — ABNORMAL HIGH (ref 6–20)
CO2: 24 mmol/L (ref 22–32)
Calcium: 9.8 mg/dL (ref 8.9–10.3)
Chloride: 98 mmol/L (ref 98–111)
Creatinine, Ser: 9.8 mg/dL — ABNORMAL HIGH (ref 0.44–1.00)
GFR, Estimated: 4 mL/min — ABNORMAL LOW (ref >60)
Glucose, Bld: 138 mg/dL — ABNORMAL HIGH (ref 70–99)
Phosphorus: 6.4 mg/dL — ABNORMAL HIGH (ref 2.5–4.6)
Potassium: 4.8 mmol/L (ref 3.5–5.1)
Sodium: 137 mmol/L (ref 135–145)

## 2022-02-14 NOTE — Progress Notes (Signed)
  Progress Note   Patient: Courtney Gray OJJ:[id number] DOB: 10/31/1970 DOA: 11/01/2021     105 DOS: the patient was seen and examined on 02/14/2022   Brief hospital course: 51 year old F ESRD on HD TTS,  recent lengthy hospitalization for Bacteroides [place] bacteremia, hepatic and splenic abscess --required amputation of third digit of left hand and underwent left AKA and had splenic drain as well as CT-guided drainage of liver abscess she had a line holiday on last admission on long-term IV antibiotics until 12/09/7167 diastolic CHF, PVD s/p bilateral AKA, IDDM-2, morbid obesity and  chronic sacral wound presenting with progressive shortness of breath in the setting of missed dialysis and admitted for hypertensive emergency  Per patient, she never missed her dialysis however according to outpatient HD records, it takes at least 4 staff members and [facility] lift for patient to be seated in the dialysis chair.    Symptoms improved with hemodialysis.  Patient underwent R UE venogram on 8/30.  Vascular surgery following for possible RUE HD access.    Patient is now waiting placement where she can get HD on stretcher that has been difficult to find.  Assessment and Plan: End stage renal disease [facility]) Patient will continue renal replacement therapy per nephrology recommendations.  Needs placement to continue with HD as outpatient.  Very deconditioned, large sacral decubitus wound.   Continue renal replacement therapy per nephrology recommendations.   Anemia of chronic kidney disease, Hgb has been stable.  Continue with EPO and Iron supplementation.   Metabolic bone disease, continue with sevelamer.   Hyperkalemia, hypomagnesemia, hypokalemia, hyponatremia, hypophosphatemia have been corrected with renal replacement therapy.   Hypertension Patient on midodrine on HD to prevent hypotension. Currently off antihypertensive medications.   Decubitus ulcer of sacral region, stage 3  (HCC) Deep wound at the sacrum, patient not able to seat.   Continue with oral oxycodone. Continue with local wound care.    Type 2 diabetes mellitus with hyperlipidemia (HCC) Her glucose has been stable and insulin therapy has been discontinued.   Statin therapy with atorvastatin.   Bacteremia Patient has completed antibiotic therapy. Bacteroides, splenic abscess.   PVD (peripheral vascular disease) (HCC) Bilateral AKA.   Continue local wound care.    Class 3 obesity (HCC) Calculated BMI is 65,3   Acute on chronic diastolic CHF (congestive heart failure) (HCC) Volume regulation with ultrafiltration in hemodialysis Continue midodrine for blood pressure support.         Subjective: Says she has difficulty sleeping overnight.  She is not having new complaints.  Physical Exam: Vitals:   02/13/22 2308 02/14/22 0836 02/14/22 1817 02/14/22 2153  BP: (!) 141/74 (!) 142/54 104/69 (!) 130/46  Pulse: 70 78 74 78  Resp:  17 14 17   Temp: 98.8 F (37.1 C) 98.6 F (37 C) 98.5 F (36.9 C) 98.7 F (37.1 C)  TempSrc: Oral Oral Oral Oral  SpO2: 100% 100% 99% 100%  Weight:      Height:       Neurology awake and alert ENT with mild pallor Cardiovascular with S1 and S2 present and rhythmic Respiratory with no rales Abdomen with no distention Bilateral AKA  Data Reviewed:   Family Communication: no family at the bedside   Disposition: Status is: Inpatient Remains inpatient appropriate because: pending placement, need to tolerate chair HD.   Planned Discharge Destination: Skilled nursing facility     Author: Kathie Dike, MD 02/14/2022 9:59 PM  For on call review [email or web address].

## 2022-02-14 NOTE — Progress Notes (Addendum)
Subjective: Seen in room sitting up in bed, said tolerated dialysis yesterday 1.5 L UF   Objective Vital signs in last 24 hours: Vitals:   02/13/22 2131 02/13/22 2201 02/13/22 2308 02/14/22 0836  BP: (!) 140/64 (!) 145/84 (!) 141/74 (!) 142/54  Pulse: 74 75 70 78  Resp:  20  17  Temp:  98.4 F (36.9 C) 98.8 F (37.1 C) 98.6 F (37 C)  TempSrc:  Oral Oral Oral  SpO2: 98% 100% 100% 100%  Weight:      Height:       Weight change:   Physical Exam: General: Obese alert chronically ill female NAD Heart: RRR no MRG Lungs: CTA bilaterally nonlabored breathing Abdomen: Obese, NABS, soft, ND NT Extremities: No edema bilateral hips bilateral AKA's Dialysis Access: TDC dressing intact  HD in hospital= MWF schedule  Problem/Plan:  Inability to obtain HD at OP center D/T body habitus and wound. Difficult situation with no clear answers yet. PT working with pt.  ESRD: Continue HD on MWF schedule. New TDC placed 02/04/2022. Refused HD Monday due to GI upset, off schedule this week. Will have HD TTS then resume MWF next week HypoTN/volume- On midodrine 10mg  qHD, with 10mg  prn mid HD w/parameters to keep SBP >100 d/t becoming symptomatic when it drops lower.  Significant weight loss during admission.  Remains euvolemic on exam.  Severe ischial sacral decubitus wound: Per primary/wound care team.  Limiting her, noted to be slow healing.  Goal to tolerate sitting upright - which she is doing with PT  COPD history of hypoxia on [place] O2 at home (was on 5 L at baseline has been able to taper to 2-3 L [place]).  Anemia of ESRD - Hgb 7.3 on max dose Aranesp 255mcg weekly. Recent Fe load completed.  Secondary hyperparathyroidism: Phos still up 6.4 but improved - continue Renvela 4.8g AC TID. Calcium elevated. Not on VDRA, last PTH 60 on 01/30/22.  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 Nutrition - diet continues to be changed from renal.  Currently on heart healthy diet.  Stay on  renal diet.  Disposition: Difficult situation. Prior to admit, patient required 6 staff members and 2 hoyer pads for transfer into HD chair as OP. Could not stay on HD for full tx. 2/2 pain of multiple decubitus ulcers and PTAR unable to lift pt into ambulance for transport.SW working on LTC at [employer] and PT working to get pt up to bariatric chair. Her wt is much lower from admit. Has tolerated 3hrs in chair so far.  Will need to tolerate 5-6hrs in a chair to tolerate OP dialysis including transport, treatment and cannulation time.  The main issue is her Rt ischial wound. PT is using a high-quality air cushion from the CIR unit. This remains to be very painful for her to sit.  Ernest Haber, PA-C [facility] Beeper [phone] 02/14/2022,12:57 PM  LOS: 105 days   ADDENDUM: PT WILL NOT BE SEEN THIS WEEKEND (11/11-11/12/23). IF ANY RENAL/HD CONCERNS - PLEASE PAGE ON-CALL EXTENDER DURING DAY HOURS Shanon Brow Carrington PA-C [phone]) OR ON-CALL NEPHROLOGIST OVERNIGHT   Labs: Basic Metabolic Panel: Recent Labs  Lab 02/10/22 2302 02/13/22 1754  NA 135 137  K 5.2* 4.8  CL 97* 98  CO2 21* 24  GLUCOSE 117* 138*  BUN 66* 52*  CREATININE 11.09* 9.80*  CALCIUM 9.8 9.8  PHOS 6.3* 6.4*   Liver Function Tests: Recent Labs  Lab 02/10/22 2302 02/13/22 1754  ALBUMIN 2.7* 2.7*   No results for input(s): "LIPASE", "AMYLASE" in the last 168 hours. No results for input(s): "AMMONIA" in the last 168 hours. CBC: Recent Labs  Lab 02/10/22 2302 02/13/22 1754  WBC 10.7* 9.7  HGB 8.2* 7.3*  HCT 26.5* 24.1*  MCV 97.4 99.2  PLT 275 271   Cardiac Enzymes: No results for input(s): "CKTOTAL", "CKMB", "CKMBINDEX", "TROPONINI" in the last 168 hours. CBG: No results for input(s): "GLUCAP" in the last 168 hours.  Studies/Results: No results found. Medications:   aspirin EC  81 mg Oral Daily   atorvastatin  10 mg Oral Daily   [START ON 02/18/2022] darbepoetin (ARANESP) injection -  DIALYSIS  200 mcg Subcutaneous Q Tue-1800   diphenhydrAMINE  50 mg Oral QHS   docusate sodium  100 mg Oral BID   feeding supplement  1 Container Oral TID BM   gabapentin  100 mg Oral Q1200   gabapentin  200 mg Oral QHS   lactulose  10 g Oral Daily   levothyroxine  100 mcg Oral Q0600   loratadine  10 mg Oral Daily   midodrine  10 mg Oral Q M,W,F-HD   pantoprazole  40 mg Oral Daily   sevelamer carbonate  4.8 g Oral TID WC

## 2022-02-14 NOTE — Progress Notes (Signed)
Physical Therapy Treatment Patient Details Name: Courtney Gray MRN: [id number] DOB: Nov 03, 1970 Today's Date: 02/14/2022   History of Present Illness Pt adm 7/28 with acute pulmonary edema; large sacral ulcer limiting sitting tolerance which makes SNF placement difficult for HD. 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    PT Comments    Seen for second session to assist back to bed. Patient able to turn self in chair to perform lateral scoot transfer back to bed with modA+2 and use of bed pad to reduce shearing of R hip wound. Able to tolerate sitting in recliner x 1.5 hours but anticipate could tolerate up to 3-4 hours without complaint if in bariatric recliner with seat lined with yellow egg crate foam. D/c plan remains appropriate.     Recommendations for follow up therapy are one component of a multi-disciplinary discharge planning process, led by the attending physician.  Recommendations may be updated based on patient status, additional functional criteria and insurance authorization.  Follow Up Recommendations  Skilled nursing-short term rehab (<3 hours/day) Can patient physically be transported by private vehicle: No   Assistance Recommended at Discharge Intermittent Supervision/Assistance  Patient can return home with the following Two people to help with walking and/or transfers;Two people to help with bathing/dressing/bathroom;Assist for transportation;Assistance with cooking/housework;Help with stairs or ramp for entrance   Equipment Recommendations  Wheelchair (measurements PT);Wheelchair cushion (measurements PT);Other (comment);Hospital bed (more accomodating mechanical lift, with sling that handles pt's with bil AKAs; It may be time to consider a specialty seating and wheelchair consultation)    Recommendations for Other Services       Precautions / Restrictions  Precautions Precautions: Fall Precaution Comments: bil AKA, 2L O2 at baseline     Mobility  Bed Mobility Overal bed mobility: Modified Independent                  Transfers Overall transfer level: Needs assistance Equipment used: None Transfers: Bed to chair/wheelchair/BSC         Lateral/Scoot Transfers: Mod assist, +2 physical assistance General transfer comment: Able to turn laterally in recliner to perform lateral scoot transfer back to bed due to not having drop arm bariatric recliner and difficulty with PA transfer. Required modA+2 to assist back into bed due to slight incline.    Ambulation/Gait                   Stairs             Wheelchair Mobility    Modified Rankin (Stroke Patients Only)       Balance Overall balance assessment: Mild deficits observed, not formally tested                                          Cognition Arousal/Alertness: Awake/alert Behavior During Therapy: WFL for tasks assessed/performed Overall Cognitive Status: Within Functional Limits for tasks assessed                                          Exercises Amputee Exercises Chair Push Up: Both, 5 reps    General Comments        Pertinent Vitals/Pain Pain Assessment Pain Assessment: Faces Faces Pain Scale: Hurts  a little bit Pain Location: B LE with transition to recliner Pain Descriptors / Indicators: Discomfort Pain Intervention(s): Monitored during session    Home Living                          Prior Function            PT Goals (current goals can now be found in the care plan section) Acute Rehab PT Goals Patient Stated Goal: to get OOB PT Goal Formulation: With patient Time For Goal Achievement: 02/20/22 Potential to Achieve Goals: Good Progress towards PT goals: Progressing toward goals    Frequency    Min 2X/week      PT Plan Current plan remains appropriate     Co-evaluation              AM-PAC PT "6 Clicks" Mobility   Outcome Measure  Help needed turning from your back to your side while in a flat bed without using bedrails?: None Help needed moving from lying on your back to sitting on the side of a flat bed without using bedrails?: None Help needed moving to and from a bed to a chair (including a wheelchair)?: Total Help needed standing up from a chair using your arms (e.g., wheelchair or bedside chair)?: Total Help needed to walk in hospital room?: Total Help needed climbing 3-5 steps with a railing? : Total 6 Click Score: 12    End of Session Equipment Utilized During Treatment: Oxygen Activity Tolerance: Patient tolerated treatment well Patient left: with family/visitor present;in bed;with call bell/phone within reach Nurse Communication: Mobility status PT Visit Diagnosis: Other abnormalities of gait and mobility (R26.89);Muscle weakness (generalized) (M62.81) Pain - Right/Left: Right Pain - part of body: Hip;Leg     Time: 6237-6283 PT Time Calculation (min) (ACUTE ONLY): 16 min  Charges:  $Therapeutic Activity: 8-22 mins                     Roddie Riegler A. Gilford Rile PT, DPT Acute Rehabilitation Services Office [phone]    [name] 02/14/2022, 4:39 PM

## 2022-02-14 NOTE — Progress Notes (Signed)
Physical Therapy Treatment Patient Details Name: Courtney Gray MRN: [id number] DOB: Jun 23, 1970 Today's Date: 02/14/2022   History of Present Illness Pt adm 7/28 with acute pulmonary edema; large sacral ulcer limiting sitting tolerance which makes SNF placement difficult for HD. 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    PT Comments    Patient seen to progress OOB to chair. Able to perform AP transfer to recliner with modA+2 and use of bed pad to assist and reduce shearing force at R hip wound. Patient eager and excited to be OOB in chair despite complaints of being tired due to minimal sleep overnight. Daughter present and supportive. Instructed on chair pushups and reinforced pressure relief with patient demonstrating good understanding. Continue to recommend SNF for ongoing Physical Therapy.       Recommendations for follow up therapy are one component of a multi-disciplinary discharge planning process, led by the attending physician.  Recommendations may be updated based on patient status, additional functional criteria and insurance authorization.  Follow Up Recommendations  Skilled nursing-short term rehab (<3 hours/day) Can patient physically be transported by private vehicle: No   Assistance Recommended at Discharge Intermittent Supervision/Assistance  Patient can return home with the following Two people to help with walking and/or transfers;Two people to help with bathing/dressing/bathroom;Assist for transportation;Assistance with cooking/housework;Help with stairs or ramp for entrance   Equipment Recommendations  Wheelchair (measurements PT);Wheelchair cushion (measurements PT);Other (comment);Hospital bed (more accomodating mechanical lift, with sling that handles pt's with bil AKAs; It may be time to consider a specialty seating and wheelchair consultation)    Recommendations for Other  Services       Precautions / Restrictions Precautions Precautions: Fall Precaution Comments: bil AKA, 2L O2 at baseline     Mobility  Bed Mobility Overal bed mobility: Modified Independent                  Transfers Overall transfer level: Needs assistance Equipment used: None Transfers: Bed to chair/wheelchair/BSC         Anterior-Posterior transfers: Mod assist, +2 physical assistance   General transfer comment: required modA+2 for AP transfer to recliner with use of bed pad to assist but also to reduce shearing force on R hip wound    Ambulation/Gait                   Stairs             Wheelchair Mobility    Modified Rankin (Stroke Patients Only)       Balance Overall balance assessment: Mild deficits observed, not formally tested                                          Cognition Arousal/Alertness: Awake/alert Behavior During Therapy: WFL for tasks assessed/performed Overall Cognitive Status: Within Functional Limits for tasks assessed                                          Exercises Amputee Exercises Chair Push Up: Both, 5 reps    General Comments        Pertinent Vitals/Pain Pain Assessment Pain Assessment: Faces Faces Pain Scale: Hurts a little bit Pain Location: B LE with  transition to recliner Pain Descriptors / Indicators: Discomfort Pain Intervention(s): Monitored during session    Home Living                          Prior Function            PT Goals (current goals can now be found in the care plan section) Acute Rehab PT Goals Patient Stated Goal: to get OOB PT Goal Formulation: With patient Time For Goal Achievement: 02/20/22 Potential to Achieve Goals: Good Progress towards PT goals: Progressing toward goals    Frequency    Min 2X/week      PT Plan Current plan remains appropriate    Co-evaluation              AM-PAC PT "6 Clicks"  Mobility   Outcome Measure  Help needed turning from your back to your side while in a flat bed without using bedrails?: None Help needed moving from lying on your back to sitting on the side of a flat bed without using bedrails?: A Little Help needed moving to and from a bed to a chair (including a wheelchair)?: Total Help needed standing up from a chair using your arms (e.g., wheelchair or bedside chair)?: Total Help needed to walk in hospital room?: Total Help needed climbing 3-5 steps with a railing? : Total 6 Click Score: 11    End of Session Equipment Utilized During Treatment: Oxygen Activity Tolerance: Patient tolerated treatment well Patient left: in chair;with call bell/phone within reach;with family/visitor present Nurse Communication: Mobility status PT Visit Diagnosis: Other abnormalities of gait and mobility (R26.89);Muscle weakness (generalized) (M62.81) Pain - Right/Left: Right Pain - part of body: Hip;Leg     Time: 1411-1447 PT Time Calculation (min) (ACUTE ONLY): 36 min  Charges:  $Therapeutic Activity: 23-37 mins                     Courtney Gray A. Courtney Gray PT, DPT Acute Rehabilitation Services Office [phone]    Courtney Gray 02/14/2022, 4:34 PM

## 2022-02-14 NOTE — Progress Notes (Signed)
Received patient in bed to unit.  Alert and oriented.  Informed consent signed and in chart.   Treatment initiated: 1802 Treatment completed: 2131  Patient tolerated well.  Transported back to the room  Alert, without acute distress.  Hand-off given to patient's nurse.   Access used: catheter Access issues: none  Total UF removed: 1500 Medication(s) given: none Post HD VS: 145/84 Post HD weight: unable to obtain   [facility]

## 2022-02-14 NOTE — TOC Progression Note (Signed)
Transition of Care [facility]) - Initial/Assessment Note    Patient Details  Name: Courtney Gray MRN: [id number] Date of Birth: November 18, 1970  Transition of Care [facility]) CM/SW Contact:    Milinda Antis, LCSWA Phone Number: 02/14/2022, 4:42 PM  Clinical Narrative:                 LCSW was contacted by [facility].  The facility requested more information.  Information sent.  LCSW is awaiting determination.    Expected Discharge Plan: Long Term Nursing Home Barriers to Discharge: No SNF bed, Transportation, Waiting for outpatient dialysis   Patient Goals and CMS Choice Patient states their goals for this hospitalization and ongoing recovery are:: To be at a facility close to her daughter      Expected Discharge Plan and Services Expected Discharge Plan: [place]       Living arrangements for the past 2 months: Single Family Home                                      Prior Living Arrangements/Services Living arrangements for the past 2 months: Single Family Home Lives with:: Adult Children Patient language and need for interpreter reviewed:: Yes        Need for Family Participation in Patient Care: Yes (Comment) Care giver support system in place?: No (comment)   Criminal Activity/Legal Involvement Pertinent to Current Situation/Hospitalization: No - Comment as needed  Activities of Daily Living Home Assistive Devices/Equipment: [employer] fast streamer ADL Screening (condition at time of admission) Patient's cognitive ability adequate to safely complete daily activities?: No Is the patient deaf or have difficulty hearing?: No Does the patient have difficulty seeing, even when wearing glasses/contacts?: No Does the patient have difficulty concentrating, remembering, or making decisions?: No Patient able to express need for assistance with ADLs?: Yes Does the patient have difficulty dressing or bathing?: Yes Independently performs ADLs?: No Does the patient  have difficulty walking or climbing stairs?: Yes Weakness of Legs: Both Weakness of Arms/Hands: Both  Permission Sought/Granted Permission sought to share information with : Other (comment) Permission granted to share information with : Yes, Verbal Permission Granted  Share Information with NAME: Falero,antoinette (Daughter)   [phone]  Permission granted to share info w AGENCY: SNF        Emotional Assessment Appearance:: Appears older than stated age Attitude/Demeanor/Rapport: Engaged Affect (typically observed): Tearful/Crying, Accepting Orientation: : Oriented to Situation, Oriented to  Time, Oriented to Place, Oriented to Self Alcohol / Substance Use: Not Applicable Psych Involvement: No (comment)  Admission diagnosis:  End stage renal disease ([place]) [N18.6] Fluid overload [E87.70] Obesity with serious comorbidity, unspecified classification, unspecified obesity type [E66.9] Pressure injury of right buttock, stage 2 (HCC) [L89.312] Patient Active Problem List   Diagnosis Date Noted   Acute on chronic systolic CHF (congestive heart failure) ([place]) 02/11/2022   Acute on chronic diastolic CHF (congestive heart failure) ([place]) 12/12/2021   PAD (peripheral artery disease) ([place]) 12/12/2021   Irritable bowel syndrome (IBS) 12/12/2021   Amputation of left middle finger 12/12/2021   GERD (gastroesophageal reflux disease) 12/12/2021   Hypertension    Type 2 diabetes mellitus with hyperlipidemia ([place])    Decubitus ulcer of sacral region, stage 3 ([place])    Class 3 obesity ([place])    Bacteremia    PVD (peripheral vascular disease) ([place])    Hepatic abscess    Pressure injury of  skin 09/13/2021   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[place])    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   Lactic acidosis 09/10/2021   Mixed diabetic hyperlipidemia associated with type 2 diabetes mellitus ([place]) 09/10/2021   Coronary artery disease involving native coronary artery of native heart without angina  pectoris 09/10/2021   Elevated troponin level not due myocardial infarction 09/10/2021   Hypotension 09/10/2021   Dry gangrene (HCC)    Pain 08/03/2021   Ischemic foot pain at rest 08/03/2021   Hypoglycemia 07/10/2021   Volume overload 07/09/2021   Leukocytosis 06/11/2021   Acute gout 06/11/2021   Hyperkalemia 05/21/2021   Hemorrhoids    Hyperlipidemia 05/18/2021   Rectal bleeding 05/17/2021   Morbid obesity with BMI of 60.0-69.9, adult ([place]) 05/17/2021   NSTEMI (non-ST elevated myocardial infarction) ([place]) 05/16/2021   Essential hypertension 09/01/2020   End stage renal disease ([place]) 09/01/2020   Chronic respiratory failure with hypoxia ([place]) 09/01/2020   Hypertensive emergency    Hypertensive urgency 11/28/2019   Uncontrolled type 2 diabetes mellitus with hypoglycemia, with long-term current use of insulin ([place]) 11/28/2019   Anemia due to chronic kidney disease 11/28/2019   PCP:  Monico Blitz, MD Pharmacy:   [facility] [address] [place] [address] Phone: [phone] Fax: [phone]  [facility] [address] [place] [address] Phone: [phone] Fax: [phone]  [place], [place] - [address] [place] [address] Phone: [phone] Fax: [phone]     Social Determinants of Health ([place]) Interventions    Readmission Risk Interventions    09/16/2021    4:36 PM 08/28/2021   11:50 AM 07/12/2021   12:38 PM  Readmission Risk Prevention Plan  Transportation Screening Complete Complete Complete  Medication Review (RN Care Manager)  Complete Complete  PCP or Specialist appointment within 3-5 days of discharge  Complete Complete  HRI or Home Care Consult Complete Complete Complete  SW Recovery Care/Counseling Consult Complete Complete Complete  Palliative Care Screening  Not Applicable Not Applicable  Skilled Nursing  Facility Complete Not Applicable Complete

## 2022-02-15 DIAGNOSIS — I5023 Acute on chronic systolic (congestive) heart failure: Secondary | ICD-10-CM | POA: Diagnosis not present

## 2022-02-15 MED ORDER — HEPARIN SODIUM (PORCINE) 1000 UNIT/ML IJ SOLN
3000.0000 [IU] | Freq: Once | INTRAMUSCULAR | Status: AC
  Administered 2022-02-15: 3000 [IU] via INTRAVENOUS

## 2022-02-15 NOTE — Progress Notes (Signed)
Progress Note   Patient: Courtney Gray OEV:[id number] DOB: 1970-11-03 DOA: 11/01/2021     105 DOS: the patient was seen and examined on 02/14/2022   Brief hospital course: 51 year old F ESRD on HD TTS,  recent lengthy hospitalization for Bacteroides Thetaiotaomicron bacteremia, hepatic and splenic abscess --required amputation of third digit of left hand and underwent left AKA and had splenic drain as well as CT-guided drainage of liver abscess she had a line holiday on last admission on long-term IV antibiotics until 11/07/9935 diastolic CHF, PVD s/p bilateral AKA, IDDM-2, morbid obesity and  chronic sacral wound presenting with progressive shortness of breath in the setting of missed dialysis and admitted for hypertensive emergency  Per patient, she never missed her dialysis however according to outpatient HD records, it takes at least 4 staff members and [facility] lift for patient to be seated in the dialysis chair.    Symptoms improved with hemodialysis.  Patient underwent R UE venogram on 8/30.  Vascular surgery following for possible RUE HD access.    Patient is now waiting placement where she can get HD on stretcher that has been difficult to find.  Assessment and Plan: End stage renal disease [facility]) Patient will continue renal replacement therapy per nephrology recommendations.  Needs placement to continue with HD as outpatient.  Very deconditioned, large sacral decubitus wound.   Continue renal replacement therapy per nephrology recommendations.   Anemia of chronic kidney disease, Hgb has been stable.  Continue with EPO and Iron supplementation.   Metabolic bone disease, continue with sevelamer.   Hyperkalemia, hypomagnesemia, hypokalemia, hyponatremia, hypophosphatemia have been corrected with renal replacement therapy.   Hypertension Patient on midodrine on HD to prevent hypotension. Currently off antihypertensive medications.   Decubitus ulcer of sacral region, stage 3  (HCC) Deep wound at the sacrum, patient not able to seat.   Continue with oral oxycodone. Continue with local wound care.    Type 2 diabetes mellitus with hyperlipidemia (HCC) Her glucose has been stable and insulin therapy has been discontinued.   Statin therapy with atorvastatin.   Bacteremia Patient has completed antibiotic therapy. Bacteroides, splenic abscess.   PVD (peripheral vascular disease) (HCC) Bilateral AKA.   Continue local wound care.    Class 3 obesity (HCC) Calculated BMI is 65,3   Acute on chronic diastolic CHF (congestive heart failure) (HCC) Volume regulation with ultrafiltration in hemodialysis Continue midodrine for blood pressure support.         Subjective:sleeping on my arrival, but wakes up to voice. Says that she is still feeling effects of benadryl overnight. No new complaints  Physical Exam: Vitals:   02/13/22 2308 02/14/22 0836 02/14/22 1817 02/14/22 2153  BP: (!) 141/74 (!) 142/54 104/69 (!) 130/46  Pulse: 70 78 74 78  Resp:  17 14 17   Temp: 98.8 F (37.1 C) 98.6 F (37 C) 98.5 F (36.9 C) 98.7 F (37.1 C)  TempSrc: Oral Oral Oral Oral  SpO2: 100% 100% 99% 100%  Weight:      Height:       Neurology somnolent, but wakes up to voice and is appopriate ENT with mild pallor Cardiovascular with S1 and S2 present and rhythmic Respiratory with no rales Abdomen with no distention Bilateral AKA  Data Reviewed:   Family Communication: no family at the bedside   Disposition: Status is: Inpatient Remains inpatient appropriate because: pending placement, need to tolerate chair HD.   Planned Discharge Destination: Skilled nursing facility     Author: Kathie Dike, MD 02/14/2022  9:59 PM  For on call review [email or web address].

## 2022-02-16 DIAGNOSIS — I5023 Acute on chronic systolic (congestive) heart failure: Secondary | ICD-10-CM | POA: Diagnosis not present

## 2022-02-16 DIAGNOSIS — R222 Localized swelling, mass and lump, trunk: Secondary | ICD-10-CM | POA: Diagnosis not present

## 2022-02-16 DIAGNOSIS — M793 Panniculitis, unspecified: Secondary | ICD-10-CM | POA: Insufficient documentation

## 2022-02-16 MED ORDER — DIPHENHYDRAMINE HCL 25 MG PO CAPS
25.0000 mg | ORAL_CAPSULE | Freq: Every day | ORAL | Status: DC
  Administered 2022-02-16 – 2022-02-21 (×6): 25 mg via ORAL
  Filled 2022-02-16 (×6): qty 1

## 2022-02-16 MED ORDER — LACTULOSE 10 GM/15ML PO SOLN
20.0000 g | Freq: Every day | ORAL | Status: DC
  Administered 2022-02-19 – 2022-02-27 (×2): 20 g via ORAL
  Filled 2022-02-16 (×12): qty 30

## 2022-02-16 MED ORDER — OXYCODONE HCL 5 MG PO TABS
5.0000 mg | ORAL_TABLET | Freq: Four times a day (QID) | ORAL | Status: DC | PRN
  Administered 2022-02-16 – 2022-02-24 (×15): 10 mg via ORAL
  Filled 2022-02-16 (×17): qty 2

## 2022-02-16 NOTE — Progress Notes (Signed)
Progress Note   Patient: Courtney Gray ZOX:[id number] DOB: 30-Aug-1970 DOA: 11/01/2021     107 DOS: the patient was seen and examined on 02/16/2022   Brief hospital course: 51 year old F ESRD on HD TTS,  recent lengthy hospitalization for Bacteroides Thetaiotaomicron bacteremia, hepatic and splenic abscess --required amputation of third digit of left hand and underwent left AKA and had splenic drain as well as CT-guided drainage of liver abscess she had a line holiday on last admission on long-term IV antibiotics until 11/05/1912 diastolic CHF, PVD s/p bilateral AKA, IDDM-2, morbid obesity and  chronic sacral wound presenting with progressive shortness of breath in the setting of missed dialysis and admitted for hypertensive emergency  Per patient, she never missed her dialysis however according to outpatient HD records, it takes at least 4 staff members and [facility] lift for patient to be seated in the dialysis chair.    Symptoms improved with hemodialysis.  Patient underwent R UE venogram on 8/30.  Vascular surgery following for possible RUE HD access.    Patient is now waiting placement where she can get HD on stretcher that has been difficult to find.  Assessment and Plan: End stage renal disease [facility]) Patient will continue renal replacement therapy per nephrology recommendations.  Needs placement to continue with HD as outpatient.  Very deconditioned, large sacral decubitus wound.   Continue renal replacement therapy per nephrology recommendations.   Anemia of chronic kidney disease, Hgb has been stable.  Continue with EPO and Iron supplementation.   Metabolic bone disease, continue with sevelamer.   Hyperkalemia, hypomagnesemia, hypokalemia, hyponatremia, hypophosphatemia have been corrected with renal replacement therapy.   Hypertension Patient on midodrine on HD to prevent hypotension. Currently off antihypertensive medications.   Decubitus ulcer of sacral region, stage 3  (HCC) Deep wound at the sacrum, patient not able to seat.   Continue with oral oxycodone. Continue with local wound care.    Type 2 diabetes mellitus with hyperlipidemia (HCC) Her glucose has been stable and insulin therapy has been discontinued.   Statin therapy with atorvastatin.   Bacteremia Patient has completed antibiotic therapy. Bacteroides, splenic abscess.   PVD (peripheral vascular disease) (HCC) Bilateral AKA.   Continue local wound care.    Class 3 obesity (HCC) Calculated BMI is 45.8   Acute on chronic diastolic CHF (congestive heart failure) (HCC) Volume regulation with ultrafiltration in hemodialysis Continue midodrine for blood pressure support.   Panniculitis Noted to have areas of induration in her right flank as well as anterior left lower pannus.  These areas are mildly warm, but tender to palpation.  Feels it may be related to subcutaneous injections with Lovenox.  Tenderness has been present for 2 weeks, but she feels it is getting worse.  I offered her CT scan to further evaluate today, but she is reluctant to undergo any further imaging.  We will check a CBC to evaluate WBC count.  If symptoms continue to get worse in the next 24 hours, will pursue CT to evaluate for underlying developing abscess.  Of note, she has been afebrile.        Subjective: Reports that she is having pain in her abdomen.  She is noted large nodules that have developed in her abdomen/pannus in the left lower abdomen as well as the right flank area.  Reports these have been present for the past 2 weeks and have been painful, although she feels that pain has been worse in the last 24 hours.  Physical Exam: Vitals:  02/16/22 0500 02/16/22 0547 02/16/22 0815 02/16/22 1737  BP:  (!) 136/47 (!) 126/44 (!) 154/57  Pulse:  70 76 69  Resp:  16  18  Temp:  98.3 F (36.8 C) 98.1 F (36.7 C) 98.1 F (36.7 C)  TempSrc:   Oral Oral  SpO2:  99% 100% 100%  Weight: 124.9 kg      Height:       Neurology somnolent, but wakes up to voice and is appopriate ENT with mild pallor Cardiovascular with S1 and S2 present and rhythmic Respiratory with no rales Abdomen with no distention.  She does have areas of induration in her pannus, and her right flank as well as anterior left lower pannus.  These areas are tender to touch. Bilateral AKA  Data Reviewed:   Family Communication: no family at the bedside   Disposition: Status is: Inpatient Remains inpatient appropriate because: pending placement, need to tolerate chair HD.   Planned Discharge Destination: Skilled nursing facility     Author: Kathie Dike, MD 02/16/2022 7:37 PM  For on call review [email or web address].

## 2022-02-16 NOTE — Assessment & Plan Note (Addendum)
Noted to have areas of painful nodularity in her right flank as well as anterior left lower pannus.  These areas are mildly warm, but tender to palpation.  Tenderness has been present for 2 weeks, but she feels it is getting worse.  She has been afebrile and wbc count normal. CT abdomen did not show any underlying skin abscess. Discussed with Nephrology, Dr. 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 Recommended Gen surgery see for biopsy. I reached out to Saverio Danker, [place] with Gen Surgery, no biopsy recommended  Continue supportive medical care with oral analgesics and control of electrolytes through hemodialysis.

## 2022-02-17 ENCOUNTER — Inpatient Hospital Stay (HOSPITAL_COMMUNITY): Payer: 59

## 2022-02-17 DIAGNOSIS — I5023 Acute on chronic systolic (congestive) heart failure: Secondary | ICD-10-CM | POA: Diagnosis not present

## 2022-02-17 DIAGNOSIS — N186 End stage renal disease: Secondary | ICD-10-CM | POA: Diagnosis not present

## 2022-02-17 DIAGNOSIS — M793 Panniculitis, unspecified: Secondary | ICD-10-CM

## 2022-02-17 DIAGNOSIS — E669 Obesity, unspecified: Secondary | ICD-10-CM | POA: Diagnosis not present

## 2022-02-17 LAB — CBC
HCT: 27.6 % — ABNORMAL LOW (ref 36.0–46.0)
Hemoglobin: 8.2 g/dL — ABNORMAL LOW (ref 12.0–15.0)
MCH: 29.4 pg (ref 26.0–34.0)
MCHC: 29.7 g/dL — ABNORMAL LOW (ref 30.0–36.0)
MCV: 98.9 fL (ref 80.0–100.0)
Platelets: 271 10*3/uL (ref 150–400)
RBC: 2.79 MIL/uL — ABNORMAL LOW (ref 3.87–5.11)
RDW: 14.8 % (ref 11.5–15.5)
WBC: 10 10*3/uL (ref 4.0–10.5)
nRBC: 0 % (ref 0.0–0.2)

## 2022-02-17 LAB — RENAL FUNCTION PANEL
Albumin: 2.8 g/dL — ABNORMAL LOW (ref 3.5–5.0)
Anion gap: 11 (ref 5–15)
BUN: 25 mg/dL — ABNORMAL HIGH (ref 6–20)
CO2: 25 mmol/L (ref 22–32)
Calcium: 9.7 mg/dL (ref 8.9–10.3)
Chloride: 102 mmol/L (ref 98–111)
Creatinine, Ser: 6.27 mg/dL — ABNORMAL HIGH (ref 0.44–1.00)
GFR, Estimated: 8 mL/min — ABNORMAL LOW (ref >60)
Glucose, Bld: 88 mg/dL (ref 70–99)
Phosphorus: 5.1 mg/dL — ABNORMAL HIGH (ref 2.5–4.6)
Potassium: 4.2 mmol/L (ref 3.5–5.1)
Sodium: 138 mmol/L (ref 135–145)

## 2022-02-17 MED ORDER — IOHEXOL 350 MG/ML SOLN
75.0000 mL | Freq: Once | INTRAVENOUS | Status: AC | PRN
  Administered 2022-02-17: 75 mL via INTRAVENOUS

## 2022-02-17 MED ORDER — FAMOTIDINE 20 MG PO TABS
20.0000 mg | ORAL_TABLET | Freq: Two times a day (BID) | ORAL | Status: DC
  Administered 2022-02-17 – 2022-02-21 (×8): 20 mg via ORAL
  Filled 2022-02-17 (×8): qty 1

## 2022-02-17 NOTE — Plan of Care (Signed)

## 2022-02-17 NOTE — Progress Notes (Signed)
[facility] Progress Note   Subjective:  Seen in room. No new events over the weekend. She's not happy about an interaction she had at dialysis last week.   Objective Vitals:   02/16/22 1737 02/16/22 2043 02/17/22 0532 02/17/22 0942  BP: (!) 154/57 (!) 113/41 (!) 123/45 (!) 122/46  Pulse: 69 72 68 64  Resp: 18 17 16 18   Temp: 98.1 F (36.7 C) 98.5 F (36.9 C) 98.3 F (36.8 C) 98.8 F (37.1 C)  TempSrc: Oral Oral  Oral  SpO2: 100% 100% 100%   Weight:      Height:       Physical Exam General: Obese female, alert and in NAD Heart: RRR, no murmurs, rubs or gallops Lungs: CTA bilaterally without wheezing, rhonchi or rales Abdomen: Soft, non-distended, +BS Extremities: No edema b/l hips Dialysis Access: [facility] with intact dressing    Additional Objective Labs: Basic Metabolic Panel: Recent Labs  Lab 02/10/22 2302 02/13/22 1754 02/17/22 0327  NA 135 137 138  K 5.2* 4.8 4.2  CL 97* 98 102  CO2 21* 24 25  GLUCOSE 117* 138* 88  BUN 66* 52* 25*  CREATININE 11.09* 9.80* 6.27*  CALCIUM 9.8 9.8 9.7  PHOS 6.3* 6.4* 5.1*    Liver Function Tests: Recent Labs  Lab 02/10/22 2302 02/13/22 1754 02/17/22 0327  ALBUMIN 2.7* 2.7* 2.8*    No results for input(s): "LIPASE", "AMYLASE" in the last 168 hours. CBC: Recent Labs  Lab 02/10/22 2302 02/13/22 1754 02/17/22 0327  WBC 10.7* 9.7 10.0  HGB 8.2* 7.3* 8.2*  HCT 26.5* 24.1* 27.6*  MCV 97.4 99.2 98.9  PLT 275 271 271    Blood Culture    Component Value Date/Time   SDES BLOOD LEFT ANTECUBITAL 10/16/2021 1534   SPECREQUEST  10/16/2021 1534    BOTTLES DRAWN AEROBIC AND ANAEROBIC Blood Culture results may not be optimal due to an inadequate volume of blood received in culture bottles   CULT  10/16/2021 1534    NO GROWTH 5 DAYS Performed at [facility] [address] [place], [place] [address]    REPTSTATUS 10/21/2021 FINAL 10/16/2021 1534    Cardiac Enzymes: No results for input(s):  "CKTOTAL", "CKMB", "CKMBINDEX", "TROPONINI" in the last 168 hours. CBG: No results for input(s): "GLUCAP" in the last 168 hours. Iron Studies: No results for input(s): "IRON", "TIBC", "TRANSFERRIN", "FERRITIN" in the last 72 hours. @lablastinr3 @ Studies/Results: No results found. Medications:   aspirin EC  81 mg Oral Daily   atorvastatin  10 mg Oral Daily   [START ON 02/18/2022] darbepoetin (ARANESP) injection - DIALYSIS  200 mcg Subcutaneous Q Tue-1800   diphenhydrAMINE  25 mg Oral QHS   docusate sodium  100 mg Oral BID   famotidine  20 mg Oral BID   feeding supplement  1 Container Oral TID BM   gabapentin  100 mg Oral Q1200   gabapentin  200 mg Oral QHS   lactulose  20 g Oral Daily   levothyroxine  100 mcg Oral Q0600   loratadine  10 mg Oral Daily   midodrine  10 mg Oral Q M,W,F-HD   pantoprazole  40 mg Oral Daily   sevelamer carbonate  4.8 g Oral TID WC    Assessment/Plan: Inability to obtain HD at OP center D/T body habitus and wound. Difficult situation with no clear answers yet. PT working with pt.  ESRD: Continue HD on MWF schedule. New TDC placed 02/04/2022.  HypoTN/volume- On midodrine 10mg  qHD, with  10mg  prn mid HD w/parameters to keep SBP >100 d/t becoming symptomatic when it drops lower.  Significant weight loss during admission.  Remains euvolemic on exam.  Severe ischial sacral decubitus wound: Per primary/wound care team.  Limiting her, noted to be slow healing.  Goal to tolerate sitting upright - which she is doing with PT  COPD history of hypoxia on [place] O2 at home (was on 5 L at baseline has been able to taper to 2-3 L [place]).  Anemia of ESRD - Hgb 8.2 on max dose Aranesp 279mcg weekly. Recent Fe load completed.  Secondary hyperparathyroidism: Phos still up but improved - continue Renvela 4.8g AC TID. Calcium elevated. Not on VDRA, last PTH 60 on 01/30/22.  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 Nutrition - diet continues to be changed from  renal.  Currently on heart healthy diet.  Stay on renal diet.  Disposition: Difficult situation. Prior to admit, patient required 6 staff members and 2 hoyer pads for transfer into HD chair as OP. Could not stay on HD for full tx. 2/2 pain of multiple decubitus ulcers and PTAR unable to lift pt into ambulance for transport.SW working on LTC at [employer] and PT working to get pt up to bariatric chair. Her wt is much lower from admit. Has tolerated 3hrs in chair so far.  Will need to tolerate 5-6hrs in a chair to tolerate OP dialysis including transport, treatment and cannulation time.  The main issue is her Rt ischial wound. PT is using a high-quality air cushion from the CIR unit. This remains to be very painful for her to sit.    [name] PA-C [facility] 02/17/2022,3:29 PM

## 2022-02-17 NOTE — Progress Notes (Signed)
Progress Note   Patient: Courtney Gray AST:[id number] DOB: 1971-03-14 DOA: 11/01/2021     108 DOS: the patient was seen and examined on 02/17/2022   Brief hospital course: 51 year old F ESRD on HD TTS,  recent lengthy hospitalization for Bacteroides Thetaiotaomicron bacteremia, hepatic and splenic abscess --required amputation of third digit of left hand and underwent left AKA and had splenic drain as well as CT-guided drainage of liver abscess she had a line holiday on last admission on long-term IV antibiotics until 12/13/9209 diastolic CHF, PVD s/p bilateral AKA, IDDM-2, morbid obesity and  chronic sacral wound presenting with progressive shortness of breath in the setting of missed dialysis and admitted for hypertensive emergency  Per patient, she never missed her dialysis however according to outpatient HD records, it takes at least 4 staff members and [facility] lift for patient to be seated in the dialysis chair.    Symptoms improved with hemodialysis.  Patient underwent R UE venogram on 8/30.  Vascular surgery following for possible RUE HD access.    Patient is now waiting placement where she can get HD on stretcher that has been difficult to find.  Assessment and Plan: End stage renal disease [facility]) Patient will continue renal replacement therapy per nephrology recommendations.  Needs placement to continue with HD as outpatient.  Very deconditioned, large sacral decubitus wound.   Continue renal replacement therapy per nephrology recommendations.   Anemia of chronic kidney disease, Hgb has been stable.  Continue with EPO and Iron supplementation.   Metabolic bone disease, continue with sevelamer.   Hyperkalemia, hypomagnesemia, hypokalemia, hyponatremia, hypophosphatemia have been corrected with renal replacement therapy.   Hypertension Patient on midodrine on HD to prevent hypotension. Currently off antihypertensive medications.   Decubitus ulcer of sacral region, stage 3  (HCC) Deep wound at the sacrum, patient not able to seat.   Continue with oral oxycodone. Continue with local wound care.    Type 2 diabetes mellitus with hyperlipidemia (HCC) Her glucose has been stable and insulin therapy has been discontinued.   Statin therapy with atorvastatin.   Bacteremia Patient has completed antibiotic therapy. Bacteroides, splenic abscess.   PVD (peripheral vascular disease) (HCC) Bilateral AKA.   Continue local wound care.    Class 3 obesity (HCC) Calculated BMI is 45.8   Acute on chronic diastolic CHF (congestive heart failure) (HCC) Volume regulation with ultrafiltration in hemodialysis Continue midodrine for blood pressure support.   Panniculitis Noted to have areas of induration in her right flank as well as anterior left lower pannus.  These areas are mildly warm, but tender to palpation.  Feels it may be related to subcutaneous injections with Lovenox.  Tenderness has been present for 2 weeks, but she feels it is getting worse.  She has been afebrile and wbc count normal. Will order CT abd to better evaluate for abscess. May need to start antibiotics based on results.        Subjective: reports having chest discomfort after eating, feels as though food got stuck in chest. Continues to have discomfort in abdomen  Physical Exam: Vitals:   02/16/22 1737 02/16/22 2043 02/17/22 0532 02/17/22 0942  BP: (!) 154/57 (!) 113/41 (!) 123/45 (!) 122/46  Pulse: 69 72 68 64  Resp: 18 17 16 18   Temp: 98.1 F (36.7 C) 98.5 F (36.9 C) 98.3 F (36.8 C) 98.8 F (37.1 C)  TempSrc: Oral Oral  Oral  SpO2: 100% 100% 100%   Weight:      Height:  Neurology somnolent, but wakes up to voice and is appopriate ENT with mild pallor Cardiovascular with S1 and S2 present and rhythmic Respiratory with no rales Abdomen with no distention.  She does have areas of induration in her pannus, and her right flank as well as anterior left lower pannus.  These  areas are tender to touch. Bilateral AKA  Data Reviewed:   Family Communication: no family at the bedside   Disposition: Status is: Inpatient Remains inpatient appropriate because: pending placement, need to tolerate chair HD.   Planned Discharge Destination: Skilled nursing facility     Author: Kathie Dike, MD 02/17/2022 3:10 PM  For on call review [email or web address].

## 2022-02-18 DIAGNOSIS — I5023 Acute on chronic systolic (congestive) heart failure: Secondary | ICD-10-CM | POA: Diagnosis not present

## 2022-02-18 DIAGNOSIS — M793 Panniculitis, unspecified: Secondary | ICD-10-CM | POA: Diagnosis not present

## 2022-02-18 DIAGNOSIS — N186 End stage renal disease: Secondary | ICD-10-CM | POA: Diagnosis not present

## 2022-02-18 DIAGNOSIS — E669 Obesity, unspecified: Secondary | ICD-10-CM | POA: Diagnosis not present

## 2022-02-18 MED ORDER — HEPARIN SODIUM (PORCINE) 1000 UNIT/ML IJ SOLN
INTRAMUSCULAR | Status: AC
  Filled 2022-02-18: qty 5

## 2022-02-18 MED ORDER — HEPARIN SODIUM (PORCINE) 1000 UNIT/ML IJ SOLN
INTRAMUSCULAR | Status: AC
  Administered 2022-02-18: 1200 [IU] via INTRAVENOUS
  Filled 2022-02-18: qty 2

## 2022-02-18 NOTE — Progress Notes (Signed)
Upon reassessment, patient stated Simethicone was effective.

## 2022-02-18 NOTE — Progress Notes (Signed)
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    Symptoms improved with hemodialysis.  Patient underwent R UE venogram on 8/30.  Vascular surgery following for possible RUE HD access.    Patient is now waiting placement where she can get HD on stretcher that has been difficult to find.  Assessment and Plan: End stage renal disease [facility]) Patient will continue renal replacement therapy per nephrology recommendations.  Needs placement to continue with HD as outpatient.  Very deconditioned, large sacral decubitus wound.   Continue renal replacement therapy per nephrology recommendations.   Anemia of chronic kidney disease, Hgb has been stable.  Continue with EPO and Iron supplementation.   Metabolic bone disease, continue with sevelamer.   Hyperkalemia, hypomagnesemia, hypokalemia, hyponatremia, hypophosphatemia have been corrected with renal replacement therapy.   Hypertension Patient on midodrine on HD to prevent hypotension. Currently off antihypertensive medications.   Decubitus ulcer of sacral region, stage 3  (HCC) Deep wound at the sacrum, patient not able to seat.   Continue with oral oxycodone. Continue with local wound care.    Type 2 diabetes mellitus with hyperlipidemia (HCC) Her glucose has been stable and insulin therapy has been discontinued.   Statin therapy with atorvastatin.   Bacteremia Patient has completed antibiotic therapy. Bacteroides, splenic abscess.   PVD (peripheral vascular disease) (HCC) Bilateral AKA.   Continue local wound care.    Class 3 obesity (HCC) Calculated BMI is 42.6  Acute on chronic diastolic CHF (congestive heart failure) (HCC) Volume regulation with ultrafiltration in hemodialysis Continue midodrine for blood pressure support.   Nodule of skin of abdomen Noted to have areas of painful nodularity in her right flank as well as anterior left lower pannus.  These areas are mildly warm, but tender to palpation.  Tenderness has been present for 2 weeks, but she feels it is getting worse.  She has been afebrile and wbc count normal. CT abdomen did not show any underlying skin abscess. Discussed with Nephrology, Dr. 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 Recommended Gen surgery see for biopsy. I reached out to Saverio Danker, [place] with Gen Surgery, who will discuss biopsy logistics with Dr. Jonnie Finner.        Subjective: she has varying degrees of pain in her abdominal nodules. She is worried about what could be causing the nodules  Physical Exam: Vitals:   02/18/22 1230 02/18/22 1300 02/18/22 1330 02/18/22 1400  BP: (!) 159/59 (!) 156/61 (!) 167/54 (!) 149/46  Pulse: 64 (!) 57 64 67  Resp: 17 13 (!) 21 19  Temp:      TempSrc:      SpO2: 100% 100% 100% 100%  Weight:      Height:       Neurology somnolent, but wakes up to voice and is appopriate ENT with mild pallor Cardiovascular with S1 and S2 present and rhythmic Respiratory with no rales Abdomen with no distention.  She does have areas of painful nodules in her  pannus, and her right flank as well as anterior left lower pannus.  These areas are tender to touch. Bilateral AKA  Data Reviewed:   Family Communication: no family at the bedside   Disposition: Status is: Inpatient Remains inpatient appropriate because: pending placement, need to tolerate chair HD.   Planned Discharge Destination: Skilled nursing facility     Author: Kathie Dike, MD 02/18/2022 8:32 PM  For on call review [email or web address].

## 2022-02-18 NOTE — Progress Notes (Addendum)
Physical Therapy Note  Contacted Courtney Gray, ATP with [facility], for a specialty setting and wheelchair evaluation to get recommendations for optimal seating and mobility options for Ms. Courtney Gray;  He will be able to come and evaluate Ms. Courtney Gray on Thursday, 11/16 at 12:30 pm;   PT will make every effort to join in and contribute to the session (will keep colleagues informed, as I am off this Thursday);  Jason's work number is: [phone] Pt in HD today off schedule, unable to work with her today,  Continuing to follow,   Roney [place], [place] Office [phone]

## 2022-02-18 NOTE — Progress Notes (Signed)
Patient complained of "chest pain that woke me up out of my sleep"  this shift. Upon further assessment patient stated that is was indigestion similar to what she had a couple of days ago; for which she was ordered Pepcid. Patient felt like the Pepcid was not given this shift. Patient reassured that it was. I offered patient PRN Simethicone in which she initially refused. I explained to patient to try all possible options before reaching out to the covering team. Patient agreed to try Simethicone. Will monitor for effects.

## 2022-02-18 NOTE — Procedures (Signed)
HD Note:  Some information was entered later than the data was gathered due to patient care needs. The stated time with the data is accurate.  Received patient in bed to unit.  Alert and oriented.  Informed consent signed and in chart.    Patient tolerated well.  Transported back to the room  Alert, without acute distress.  Hand-off given to patient's nurse.   Access used: HD Catheter Access issues: None  Total UF removed: 2000 ml     [facility]

## 2022-02-18 NOTE — TOC Progression Note (Signed)
Transition of Care [facility]) - Initial/Assessment Note    Patient Details  Name: Courtney Gray MRN: [id number] Date of Birth: [birth year]  [facility]) CM/SW Contact:    Milinda Antis, LCSWA Phone Number: 02/18/2022, 12:38 PM  Clinical Narrative:                 LCSW contacted admissions at [facility] to inquire about referral.  LCSW spoke with [place] who reported that the admissions coordinator is out of the office and will return tomorrow.  LCSW with follow up with the facility tomorrow.   Expected Discharge Plan: Long Term Nursing Home Barriers to Discharge: No SNF bed, Transportation, Waiting for outpatient dialysis   Patient Goals and CMS Choice Patient states their goals for this hospitalization and ongoing recovery are:: To be at a facility close to her daughter      Expected Discharge Plan and Services Expected Discharge Plan: [place]       Living arrangements for the past 2 months: Single Family Home                                      Prior Living Arrangements/Services Living arrangements for the past 2 months: Single Family Home Lives with:: Adult Children Patient language and need for interpreter reviewed:: Yes        Need for Family Participation in Patient Care: Yes (Comment) Care giver support system in place?: No (comment)   Criminal Activity/Legal Involvement Pertinent to Current Situation/Hospitalization: No - Comment as needed  Activities of Daily Living Home Assistive Devices/Equipment: [employer] fast streamer ADL Screening (condition at time of admission) Patient's cognitive ability adequate to safely complete daily activities?: No Is the patient deaf or have difficulty hearing?: No Does the patient have difficulty seeing, even when wearing glasses/contacts?: No Does the patient have difficulty concentrating, remembering, or making decisions?: No Patient able to express need for assistance with ADLs?: Yes Does the patient  have difficulty dressing or bathing?: Yes Independently performs ADLs?: No Does the patient have difficulty walking or climbing stairs?: Yes Weakness of Legs: Both Weakness of Arms/Hands: Both  Permission Sought/Granted Permission sought to share information with : Other (comment) Permission granted to share information with : Yes, Verbal Permission Granted  Share Information with NAME: Sons,antoinette (Daughter)   [phone]  Permission granted to share info w AGENCY: SNF        Emotional Assessment Appearance:: Appears older than stated age Attitude/Demeanor/Rapport: Engaged Affect (typically observed): Tearful/Crying, Accepting Orientation: : Oriented to Situation, Oriented to  Time, Oriented to Place, Oriented to Self Alcohol / Substance Use: Not Applicable Psych Involvement: No (comment)  Admission diagnosis:  End stage renal disease ([place]) [N18.6] Fluid overload [E87.70] Obesity with serious comorbidity, unspecified classification, unspecified obesity type [E66.9] Pressure injury of right buttock, stage 2 ([place]) [L89.312] Patient Active Problem List   Diagnosis Date Noted   Panniculitis 02/16/2022   Acute on chronic systolic CHF (congestive heart failure) ([place]) 02/11/2022   Acute on chronic diastolic CHF (congestive heart failure) ([place]) 12/12/2021   PAD (peripheral artery disease) ([place]) 12/12/2021   Irritable bowel syndrome (IBS) 12/12/2021   Amputation of left middle finger 12/12/2021   GERD (gastroesophageal reflux disease) 12/12/2021   Hypertension    Type 2 diabetes mellitus with hyperlipidemia ([place])    Decubitus ulcer of sacral region, stage 3 ([place])    Class 3 obesity ([place])  Bacteremia    PVD (peripheral vascular disease) ([place])    Hepatic abscess    Pressure injury of skin 09/13/2021   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[place])    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   Lactic acidosis 09/10/2021   Mixed diabetic hyperlipidemia associated with type 2 diabetes  mellitus ([place]) 09/10/2021   Coronary artery disease involving native coronary artery of native heart without angina pectoris 09/10/2021   Elevated troponin level not due myocardial infarction 09/10/2021   Hypotension 09/10/2021   Dry gangrene (HCC)    Pain 08/03/2021   Ischemic foot pain at rest 08/03/2021   Hypoglycemia 07/10/2021   Volume overload 07/09/2021   Leukocytosis 06/11/2021   Acute gout 06/11/2021   Hyperkalemia 05/21/2021   Hemorrhoids    Hyperlipidemia 05/18/2021   Rectal bleeding 05/17/2021   Morbid obesity with BMI of 60.0-69.9, adult ([place]) 05/17/2021   NSTEMI (non-ST elevated myocardial infarction) ([place]) 05/16/2021   Essential hypertension 09/01/2020   End stage renal disease ([place]) 09/01/2020   Chronic respiratory failure with hypoxia ([place]) 09/01/2020   Hypertensive emergency    Hypertensive urgency 11/28/2019   Uncontrolled type 2 diabetes mellitus with hypoglycemia, with long-term current use of insulin ([place]) 11/28/2019   Anemia due to chronic kidney disease 11/28/2019   PCP:  Monico Blitz, MD Pharmacy:   [facility], [place] [address] [place] [address] Phone: [phone] Fax: [phone]  [facility] [address] [place] [address] Phone: [phone] Fax: [phone]  [place], [place] - [address] [place] [address] Phone: [phone] Fax: [phone]     Social Determinants of Health ([place]) Interventions    Readmission Risk Interventions    09/16/2021    4:36 PM 08/28/2021   11:50 AM 07/12/2021   12:38 PM  Readmission Risk Prevention Plan  Transportation Screening Complete Complete Complete  Medication Review (RN Care Manager)  Complete Complete  PCP or Specialist appointment within 3-5 days of discharge  Complete Complete  HRI or Home Care Consult Complete Complete Complete  SW Recovery Care/Counseling  Consult Complete Complete Complete  Palliative Care Screening  Not Applicable Not Applicable  Skilled Nursing Facility Complete Not Applicable Complete

## 2022-02-18 NOTE — Progress Notes (Signed)
[facility] Progress Note   Subjective:  Seen in [facility] - on HD off schedule. Tolerating UF so far. Painful nodular lesions in abdomen. Dr. 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 Talked with her about it.   Objective Vitals:   02/18/22 0956 02/18/22 1005 02/18/22 1030 02/18/22 1100  BP: (!) 119/59 (!) 159/60 (!) 154/67 (!) 114/52  Pulse: (!) 25 78 69 68  Resp: 17 16 11 12   Temp:      TempSrc:      SpO2: (!) 89% 100% 99% 100%  Weight:      Height:       Physical Exam General: Obese female, alert and in NAD Heart: RRR, no murmurs, rubs or gallops Lungs: CTA bilaterally without wheezing, rhonchi or rales Abdomen: Soft, non-distended, +BS Extremities: No edema b/l hips Dialysis Access: [facility] with intact dressing    Additional Objective Labs: Basic Metabolic Panel: Recent Labs  Lab 02/13/22 1754 02/17/22 0327  NA 137 138  K 4.8 4.2  CL 98 102  CO2 24 25  GLUCOSE 138* 88  BUN 52* 25*  CREATININE 9.80* 6.27*  CALCIUM 9.8 9.7  PHOS 6.4* 5.1*    Liver Function Tests: Recent Labs  Lab 02/13/22 1754 02/17/22 0327  ALBUMIN 2.7* 2.8*    No results for input(s): "LIPASE", "AMYLASE" in the last 168 hours. CBC: Recent Labs  Lab 02/13/22 1754 02/17/22 0327  WBC 9.7 10.0  HGB 7.3* 8.2*  HCT 24.1* 27.6*  MCV 99.2 98.9  PLT 271 271    Blood Culture    Component Value Date/Time   SDES BLOOD LEFT ANTECUBITAL 10/16/2021 1534   SPECREQUEST  10/16/2021 1534    BOTTLES DRAWN AEROBIC AND ANAEROBIC Blood Culture results may not be optimal due to an inadequate volume of blood received in culture bottles   CULT  10/16/2021 1534    NO GROWTH 5 DAYS Performed at [facility], [place] [address] [place], [place] [address]    REPTSTATUS 10/21/2021 FINAL 10/16/2021 1534    Cardiac Enzymes: No results for input(s): "CKTOTAL", "CKMB", "CKMBINDEX", "TROPONINI" in the last 168 hours. CBG: No results for input(s): "GLUCAP" in the last 168 hours. Iron  Studies: No results for input(s): "IRON", "TIBC", "TRANSFERRIN", "FERRITIN" in the last 72 hours. @lablastinr3 @ Studies/Results: CT ABDOMEN PELVIS W CONTRAST  Result Date: 02/17/2022 CLINICAL DATA:  Acute nonlocalized abdominal pain EXAM: CT ABDOMEN AND PELVIS WITH CONTRAST TECHNIQUE: Multidetector CT imaging of the abdomen and pelvis was performed using the standard protocol following bolus administration of intravenous contrast. RADIATION DOSE REDUCTION: This exam was performed according to the departmental dose-optimization program which includes automated exposure control, adjustment of the mA and/or kV according to patient size and/or use of iterative reconstruction technique. CONTRAST:  23mL OMNIPAQUE IOHEXOL 350 MG/ML SOLN COMPARISON:  11/14/2018 FINDINGS: Lower chest: Extensive multi-vessel coronary artery calcification. Calcification of the aortic valve leaflets noted. Calcification within the inferior right atrium may relate to remote inflammation or thrombus. Global cardiac size is within normal limits. Small left pleural effusion is present, decreased in size since prior examination. Mild associated left basilar atelectasis. Hepatobiliary: No focal liver abnormality is seen. No gallstones, gallbladder wall thickening, or biliary dilatation. Pancreas: Unremarkable Spleen: Unremarkable Adrenals/Urinary Tract: The adrenal glands are unremarkable. The kidneys are markedly atrophic. Mild bilateral nonspecific perinephric stranding. Bladder is decompressed and is unremarkable. Stomach/Bowel: Stomach is within normal limits. Appendix appears normal. No evidence of bowel wall thickening, distention, or inflammatory changes. Vascular/Lymphatic: Extensive aortoiliac atherosclerotic calcification.  Extensive visceral arteriosclerosis. No aortic aneurysm. No pathologic adenopathy within the abdomen and pelvis. Reproductive: Uterus and bilateral adnexa are unremarkable. Other: No abdominal wall hernia.  Musculoskeletal: Right gluteal decubitus ulcer has progressed in the interval since prior examination with the ulcer cavity now extending to the right ischium which demonstrates erosion in keeping with changes of osteomyelitis, best seen on axial image # 95/3. Debris noted within the ulcer cavity. No acute bone abnormality. IMPRESSION: 1. No acute intra-abdominal pathology identified. No definite radiographic explanation for the patient's reported symptoms. 2. Extensive multi-vessel coronary artery calcification. 3. Calcification of the aortic valve leaflets. Echocardiography may be helpful to assess the degree of valvular dysfunction. 4. Small left pleural effusion, decreased in size since prior examination. 5. Marked bilateral renal atrophy in keeping with changes of end-stage renal disease 6. Progressive right gluteal decubitus ulcer with ulcer cavity now extending to the right ischium which demonstrates erosion in keeping with changes of osteomyelitis. 7. Extensive aortoiliac and visceral arteriosclerosis. Aortic Atherosclerosis (ICD10-I70.0). Electronically Signed   By: Fidela Salisbury M.D.   On: 02/17/2022 20:48   Medications:   aspirin EC  81 mg Oral Daily   atorvastatin  10 mg Oral Daily   darbepoetin (ARANESP) injection - DIALYSIS  200 mcg Subcutaneous Q Tue-1800   diphenhydrAMINE  25 mg Oral QHS   docusate sodium  100 mg Oral BID   famotidine  20 mg Oral BID   feeding supplement  1 Container Oral TID BM   gabapentin  100 mg Oral Q1200   gabapentin  200 mg Oral QHS   lactulose  20 g Oral Daily   levothyroxine  100 mcg Oral Q0600   loratadine  10 mg Oral Daily   midodrine  10 mg Oral Q M,W,F-HD   pantoprazole  40 mg Oral Daily   sevelamer carbonate  4.8 g Oral TID WC    Assessment/Plan: Inability to obtain HD at OP center D/T body habitus and wound. Difficult situation with no clear answers yet. PT working with pt.  ESRD: Continue HD on MWF schedule. New TDC placed 02/04/2022.  Painful  nodular lesions in abdomen. 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 Have asked primary team to consult surgery for biopsy. Not on any calcium based products or vitamin D.  HypoTN/volume- On midodrine 10mg  qHD, with 10mg  prn mid HD w/parameters to keep SBP >100 d/t becoming symptomatic when it drops lower.  Significant weight loss during admission.  Remains euvolemic on exam.  Severe ischial sacral decubitus wound: Per primary/wound care team.  Limiting her, noted to be slow healing.  Goal to tolerate sitting upright - which she is doing with PT  COPD history of hypoxia on [place] O2 at home (was on 5 L at baseline has been able to taper to 2-3 L [place]).  Anemia of ESRD - Hgb 8.2 on max dose Aranesp 257mcg weekly. Recent Fe load completed.  Secondary hyperparathyroidism: Phos still up but improved - continue Renvela 4.8g AC TID. Calcium elevated. Not on VDRA, last PTH 60 on 01/30/22.  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 Nutrition - diet continues to be changed from renal.  Currently on heart healthy diet.  Stay on renal diet.  Disposition: Difficult situation. Prior to admit, patient required 6 staff members and 2 hoyer pads for transfer into HD chair as OP. Could not stay on HD for full tx. 2/2 pain of multiple decubitus ulcers and PTAR unable to lift pt into ambulance for transport.SW working  on LTC at SNF and PT working to get pt up to bariatric chair. Her wt is much lower from admit. Has tolerated 3hrs in chair so far.  Will need to tolerate 5-6hrs in a chair to tolerate OP dialysis including transport, treatment and cannulation time.  The main issue is her Rt ischial wound. PT is using a high-quality air cushion from the CIR unit. This remains to be very painful for her to sit.    [name] PA-C [facility] 02/18/2022,11:13 AM

## 2022-02-18 NOTE — Plan of Care (Signed)

## 2022-02-18 NOTE — Progress Notes (Signed)
Patient made aware that [facility] wanted to take her for treatment this morning. Patient declined stating "I can't go this early, I haven't eaten breakfast yet". The [facility] RN stated that there was limited space today and she should come now. Patient declined.

## 2022-02-19 DIAGNOSIS — N186 End stage renal disease: Secondary | ICD-10-CM | POA: Diagnosis not present

## 2022-02-19 DIAGNOSIS — K219 Gastro-esophageal reflux disease without esophagitis: Secondary | ICD-10-CM

## 2022-02-19 DIAGNOSIS — R222 Localized swelling, mass and lump, trunk: Secondary | ICD-10-CM | POA: Diagnosis not present

## 2022-02-19 DIAGNOSIS — I5023 Acute on chronic systolic (congestive) heart failure: Secondary | ICD-10-CM | POA: Diagnosis not present

## 2022-02-19 DIAGNOSIS — R7881 Bacteremia: Secondary | ICD-10-CM | POA: Diagnosis not present

## 2022-02-19 MED ORDER — SODIUM THIOSULFATE 250 MG/ML IV SOLN
25.0000 g | INTRAVENOUS | Status: DC
  Filled 2022-02-19: qty 100

## 2022-02-19 NOTE — Progress Notes (Signed)
Progress Note   Patient: Courtney Gray VWU:[id number] DOB: 09-Apr-1970 DOA: 11/01/2021     110 DOS: the patient was seen and examined on 02/19/2022   Brief hospital course: 51 year old F ESRD on HD TTS,  recent lengthy hospitalization for Bacteroides [place]iotaomicron bacteremia, hepatic and splenic abscess --required amputation of third digit of left hand and underwent left AKA and had splenic drain as well as CT-guided drainage of liver abscess she had a line holiday on last admission on long-term IV antibiotics until 05/17/5619 diastolic CHF, PVD s/p bilateral AKA, IDDM-2, morbid obesity and  chronic sacral wound presenting with progressive shortness of breath in the setting of missed dialysis and admitted for hypertensive emergency  Per patient, she never missed her dialysis however according to outpatient HD records, it takes at least 4 staff members and [facility] lift for patient to be seated in the dialysis chair.    Symptoms improved with hemodialysis.  Patient underwent R UE venogram on 8/30.  Vascular surgery following for possible RUE HD access.    Patient is now waiting placement where she can get HD on stretcher that has been difficult to find.  Assessment and Plan: End stage renal disease [facility]) Patient will continue renal replacement therapy per nephrology recommendations.  Needs placement to continue with HD as outpatient.  Very deconditioned, large sacral decubitus wound.   Continue renal replacement therapy per nephrology recommendations.   Anemia of chronic kidney disease, Hgb has been stable.  Continue with EPO and Iron supplementation.   Metabolic bone disease, continue with sevelamer.   Hyperkalemia, hypomagnesemia, hypokalemia, hyponatremia, hypophosphatemia have been corrected with renal replacement therapy.   Hypertension Patient on midodrine on HD to prevent hypotension. Currently off antihypertensive medications.   Decubitus ulcer of sacral region, stage 3  (HCC) Deep wound at the sacrum, patient not able to seat.   Continue with oral oxycodone. Continue with local wound care.    Type 2 diabetes mellitus with hyperlipidemia (HCC) Her glucose has been stable and insulin therapy has been discontinued.   Statin therapy with atorvastatin.   Bacteremia Patient has completed antibiotic therapy. Bacteroides, splenic abscess.   PVD (peripheral vascular disease) (HCC) Bilateral AKA.   Continue local wound care.    Class 3 obesity (HCC) Calculated BMI is 42.6  Acute on chronic diastolic CHF (congestive heart failure) (HCC) Volume regulation with ultrafiltration in hemodialysis Continue midodrine for blood pressure support.   Nodule of skin of abdomen Noted to have areas of painful nodularity in her right flank as well as anterior left lower pannus.  These areas are mildly warm, but tender to palpation.  Tenderness has been present for 2 weeks, but she feels it is getting worse.  She has been afebrile and wbc count normal. CT abdomen did not show any underlying skin abscess. Discussed with Nephrology, Dr. 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 Recommended Gen surgery see for biopsy. I reached out to Saverio Danker, [place] with Gen Surgery, who will discuss biopsy logistics with Dr. Jonnie Finner.    Subjective:  Reporting intermittent insomnia; no chest pain, no nausea, no vomiting, no shortness of breath.  Complaining of abdominal discomfort.  Physical Exam: Vitals:   02/18/22 1400 02/18/22 2127 02/19/22 0548 02/19/22 0848  BP: (!) 149/46 (!) 118/46 (!) 141/57 (!) 119/54  Pulse: 67 71 67 75  Resp: 19 19 18 16   Temp:  98.5 F (36.9 C) 98.5 F (36.9 C) 98.7 F (37.1 C)  TempSrc:  Oral Oral Oral  SpO2: 100%  100% 100% 100%  Weight:      Height:       General exam: Alert, awake, oriented x 3; good saturation on chronic supplementation in place.  Denies chest pain, no nausea, no vomiting.  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 Respiratory system: No using accessory muscles; no wheezing or crackles on exam. Cardiovascular system:RRR. No rubs or gallops. Gastrointestinal system: Abdomen is obese, nondistended, soft and tender to palpation in nodules in her pannus and flank areas.  Positive bowel sounds appreciated.   Central nervous system: Alert and oriented. No focal neurological deficits. Extremities: No bilateral AKA. Skin: No rashes, lesions or ulcers Psychiatry: Judgement and insight appear normal. Mood & affect appropriate.    Family Communication: no family at the bedside   Disposition: Status is: Inpatient Remains inpatient appropriate because: pending placement   Planned Discharge Destination: Skilled nursing facility   Author: Barton Dubois, MD 02/19/2022 6:04 PM  For on call review [email or web address].

## 2022-02-19 NOTE — Progress Notes (Signed)
[facility] Progress Note   Subjective:  Had dialysis off schedule yesterday --refused HD today. "Can't do back to back treatments"   Objective Vitals:   02/18/22 1400 02/18/22 2127 02/19/22 0548 02/19/22 0848  BP: (!) 149/46 (!) 118/46 (!) 141/57 (!) 119/54  Pulse: 67 71 67 75  Resp: 19 19 18 16   Temp:  98.5 F (36.9 C) 98.5 F (36.9 C) 98.7 F (37.1 C)  TempSrc:  Oral Oral Oral  SpO2: 100% 100% 100% 100%  Weight:      Height:       Physical Exam General: Obese female, alert and in NAD Heart: RRR, no murmurs, rubs or gallops Lungs: CTA bilaterally without wheezing, rhonchi or rales Abdomen: Soft, non-distended, +BS Extremities: No edema b/l hips Dialysis Access: [facility] with intact dressing    Additional Objective Labs: Basic Metabolic Panel: Recent Labs  Lab 02/13/22 1754 02/17/22 0327  NA 137 138  K 4.8 4.2  CL 98 102  CO2 24 25  GLUCOSE 138* 88  BUN 52* 25*  CREATININE 9.80* 6.27*  CALCIUM 9.8 9.7  PHOS 6.4* 5.1*    Liver Function Tests: Recent Labs  Lab 02/13/22 1754 02/17/22 0327  ALBUMIN 2.7* 2.8*    No results for input(s): "LIPASE", "AMYLASE" in the last 168 hours. CBC: Recent Labs  Lab 02/13/22 1754 02/17/22 0327  WBC 9.7 10.0  HGB 7.3* 8.2*  HCT 24.1* 27.6*  MCV 99.2 98.9  PLT 271 271    Blood Culture    Component Value Date/Time   SDES BLOOD LEFT ANTECUBITAL 10/16/2021 1534   SPECREQUEST  10/16/2021 1534    BOTTLES DRAWN AEROBIC AND ANAEROBIC Blood Culture results may not be optimal due to an inadequate volume of blood received in culture bottles   CULT  10/16/2021 1534    NO GROWTH 5 DAYS Performed at [facility], [place] [address], [place], [place] [address]    REPTSTATUS 10/21/2021 FINAL 10/16/2021 1534    Cardiac Enzymes: No results for input(s): "CKTOTAL", "CKMB", "CKMBINDEX", "TROPONINI" in the last 168 hours. CBG: No results for input(s): "GLUCAP" in the last 168 hours. Iron Studies: No results  for input(s): "IRON", "TIBC", "TRANSFERRIN", "FERRITIN" in the last 72 hours. @lablastinr3 @ Studies/Results: CT ABDOMEN PELVIS W CONTRAST  Result Date: 02/17/2022 CLINICAL DATA:  Acute nonlocalized abdominal pain EXAM: CT ABDOMEN AND PELVIS WITH CONTRAST TECHNIQUE: Multidetector CT imaging of the abdomen and pelvis was performed using the standard protocol following bolus administration of intravenous contrast. RADIATION DOSE REDUCTION: This exam was performed according to the departmental dose-optimization program which includes automated exposure control, adjustment of the mA and/or kV according to patient size and/or use of iterative reconstruction technique. CONTRAST:  70mL OMNIPAQUE IOHEXOL 350 MG/ML SOLN COMPARISON:  11/14/2018 FINDINGS: Lower chest: Extensive multi-vessel coronary artery calcification. Calcification of the aortic valve leaflets noted. Calcification within the inferior right atrium may relate to remote inflammation or thrombus. Global cardiac size is within normal limits. Small left pleural effusion is present, decreased in size since prior examination. Mild associated left basilar atelectasis. Hepatobiliary: No focal liver abnormality is seen. No gallstones, gallbladder wall thickening, or biliary dilatation. Pancreas: Unremarkable Spleen: Unremarkable Adrenals/Urinary Tract: The adrenal glands are unremarkable. The kidneys are markedly atrophic. Mild bilateral nonspecific perinephric stranding. Bladder is decompressed and is unremarkable. Stomach/Bowel: Stomach is within normal limits. Appendix appears normal. No evidence of bowel wall thickening, distention, or inflammatory changes. Vascular/Lymphatic: Extensive aortoiliac atherosclerotic calcification. Extensive visceral arteriosclerosis. No aortic aneurysm. No pathologic  adenopathy within the abdomen and pelvis. Reproductive: Uterus and bilateral adnexa are unremarkable. Other: No abdominal wall hernia. Musculoskeletal: Right  gluteal decubitus ulcer has progressed in the interval since prior examination with the ulcer cavity now extending to the right ischium which demonstrates erosion in keeping with changes of osteomyelitis, best seen on axial image # 95/3. Debris noted within the ulcer cavity. No acute bone abnormality. IMPRESSION: 1. No acute intra-abdominal pathology identified. No definite radiographic explanation for the patient's reported symptoms. 2. Extensive multi-vessel coronary artery calcification. 3. Calcification of the aortic valve leaflets. Echocardiography may be helpful to assess the degree of valvular dysfunction. 4. Small left pleural effusion, decreased in size since prior examination. 5. Marked bilateral renal atrophy in keeping with changes of end-stage renal disease 6. Progressive right gluteal decubitus ulcer with ulcer cavity now extending to the right ischium which demonstrates erosion in keeping with changes of osteomyelitis. 7. Extensive aortoiliac and visceral arteriosclerosis. Aortic Atherosclerosis (ICD10-I70.0). Electronically Signed   By: Fidela Salisbury M.D.   On: 02/17/2022 20:48   Medications:   aspirin EC  81 mg Oral Daily   atorvastatin  10 mg Oral Daily   darbepoetin (ARANESP) injection - DIALYSIS  200 mcg Subcutaneous Q Tue-1800   diphenhydrAMINE  25 mg Oral QHS   docusate sodium  100 mg Oral BID   famotidine  20 mg Oral BID   feeding supplement  1 Container Oral TID BM   gabapentin  100 mg Oral Q1200   gabapentin  200 mg Oral QHS   lactulose  20 g Oral Daily   levothyroxine  100 mcg Oral Q0600   loratadine  10 mg Oral Daily   midodrine  10 mg Oral Q M,W,F-HD   pantoprazole  40 mg Oral Daily   sevelamer carbonate  4.8 g Oral TID WC    Assessment/Plan: Inability to obtain HD at OP center D/T body habitus and wound. Difficult situation with no clear answers yet. PT working with pt -Appreciate all efforts from PT team.  ESRD: Continue HD on MWF schedule. New TDC placed  02/04/2022.  Had HD off schedule Tues. Refusing HD today, doesn't do back to back treatments, so next HD Thursday.  Painful nodular lesions in abdomen. 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 Surgery will not be able to do biopsy any time soon. Will treat with sodium thiosulfate empirically.  Not on any calcium based products or vitamin D.  HypoTN/volume- On midodrine 10mg  qHD, with 10mg  prn mid HD w/parameters to keep SBP >100 d/t becoming symptomatic when it drops lower.  Significant weight loss during admission.  Remains euvolemic on exam.  Severe ischial sacral decubitus wound: Per primary/wound care team.  Limiting her, noted to be slow healing.  Goal to tolerate sitting upright - which she is doing with PT  COPD history of hypoxia on [place] O2 at home (was on 5 L at baseline has been able to taper to 2-3 L [place]).  Anemia of ESRD - Hgb 8.2 on max dose Aranesp 282mcg weekly. Recent Fe load completed.  Secondary hyperparathyroidism: Phos still up but improved - continue Renvela 4.8g AC TID. Calcium elevated. Not on VDRA, last PTH 60 on 01/30/22.  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 Nutrition - diet continues to be changed from renal.  Currently on heart healthy diet.  Stay on renal diet.  Disposition: Difficult situation. Prior to admit, patient required 6 staff members and 2 hoyer pads for transfer into  HD chair as OP. Could not stay on HD for full tx. 2/2 pain of multiple decubitus ulcers and PTAR unable to lift pt into ambulance for transport.SW working on LTC at [employer] and PT working to get pt up to bariatric chair. Her wt is much lower from admit. Has tolerated 3hrs in chair so far.  Will need to tolerate 5-6hrs in a chair to tolerate OP dialysis including transport, treatment and cannulation time.  The main issue is her Rt ischial wound. Scheduled to see rehab specialist tomorrow 11/16 --may be able to get customized chair/cushion.   [name]  PA-C [facility] 02/19/2022,12:32 PM

## 2022-02-19 NOTE — Progress Notes (Signed)
Patient asked to give meds at a later time.Will continue efforts.

## 2022-02-19 NOTE — Progress Notes (Signed)
Physical Therapy Treatment Patient Details Name: Courtney Gray MRN: [id number] DOB: [birth year] Today's Date: 02/19/2022   History of Present Illness Pt adm 7/28 with acute pulmonary edema; large sacral ulcer limiting sitting tolerance which makes SNF placement difficult for HD. 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    PT Comments    Continuing work on functional mobility and activity tolerance;  Pt very concerned about diarrhea, and  politely declines OOB activity;   We discussed plans for specialty seating and wheelchair evaluation tomorrow at 12:30 with [name], [employer]; Questions solicited and answered;   Also discussed short-term acute rehab goals and longer term goals looking towards maximizing independence; She voiced that she enjoys being out of her room in the wheelchair, nd wants to be to be able to do more of that; Will consider options for a staff member, volunteer, or student to roll, stroll, and talk with her.    Recommendations for follow up therapy are one component of a multi-disciplinary discharge planning process, led by the attending physician.  Recommendations may be updated based on patient status, additional functional criteria and insurance authorization.  Follow Up Recommendations  Skilled nursing-short term rehab (<3 hours/day) Can patient physically be transported by private vehicle: No   Assistance Recommended at Discharge Intermittent Supervision/Assistance  Patient can return home with the following Two people to help with walking and/or transfers;Two people to help with bathing/dressing/bathroom;Assist for transportation;Assistance with cooking/housework;Help with stairs or ramp for entrance   Equipment Recommendations  Wheelchair (measurements PT);Wheelchair cushion (measurements PT);Other (comment);Hospital bed (more accomodating mechanical lift, with sling that  handles pt's with bil AKAs; It may be time to consider a specialty seating and wheelchair consultation)    Recommendations for Other Services       Precautions / Restrictions Precautions Precautions: Fall Precaution Comments: bil AKA, 2L O2 at baseline     Mobility  Bed Mobility                    Transfers                        Ambulation/Gait                   Stairs             Wheelchair Mobility    Modified Rankin (Stroke Patients Only)       Balance                                            Cognition Arousal/Alertness: Awake/alert Behavior During Therapy: WFL for tasks assessed/performed Overall Cognitive Status: Within Functional Limits for tasks assessed                                          Exercises      General Comments General comments (skin integrity, edema, etc.): Pt participating in discussion re: short-term acute rehab goals and longer term goals looking towards maximizing independence; She voiced that she enjoys being out of her room in the wheelchair, nd wants to be to be able to do more of that; Will consider options for a staff member, volunteer,  or student to roll, stroll, and talk with her.      Pertinent Vitals/Pain Pain Assessment Pain Assessment: Faces Faces Pain Scale: Hurts a little bit Pain Location: abdominal discomfort (pt concerned about possible diarrhea Pain Descriptors / Indicators: Discomfort Pain Intervention(s): Monitored during session    Home Living                          Prior Function            PT Goals (current goals can now be found in the care plan section) Acute Rehab PT Goals Patient Stated Goal: to get OOB; likes people and wants to be able to get out and about more PT Goal Formulation: With patient Time For Goal Achievement: 02/20/22 Potential to Achieve Goals: Good Progress towards PT goals: Progressing toward  goals    Frequency    Min 2X/week      PT Plan Current plan remains appropriate    Co-evaluation              AM-PAC PT "6 Clicks" Mobility   Outcome Measure  Help needed turning from your back to your side while in a flat bed without using bedrails?: None Help needed moving from lying on your back to sitting on the side of a flat bed without using bedrails?: None Help needed moving to and from a bed to a chair (including a wheelchair)?: Total Help needed standing up from a chair using your arms (e.g., wheelchair or bedside chair)?: Total Help needed to walk in hospital room?: Total Help needed climbing 3-5 steps with a railing? : Total 6 Click Score: 12    End of Session Equipment Utilized During Treatment: Oxygen Activity Tolerance: Patient tolerated treatment well Patient left: in bed;with call bell/phone within reach Nurse Communication: Other (comment) (appointment for Specialty Seating and Mobility Evaluation tomorrow) PT Visit Diagnosis: Other abnormalities of gait and mobility (R26.89);Muscle weakness (generalized) (M62.81) Pain - Right/Left: Right Pain - part of body: Hip;Leg     Time: 1443-1500 PT Time Calculation (min) (ACUTE ONLY): 17 min  Charges:  $Self Care/Home Management: [place], [place] Office [phone]    Courtney Gray 02/19/2022, 6:07 PM

## 2022-02-20 DIAGNOSIS — I10 Essential (primary) hypertension: Secondary | ICD-10-CM | POA: Diagnosis not present

## 2022-02-20 DIAGNOSIS — I5023 Acute on chronic systolic (congestive) heart failure: Secondary | ICD-10-CM | POA: Diagnosis not present

## 2022-02-20 DIAGNOSIS — J9611 Chronic respiratory failure with hypoxia: Secondary | ICD-10-CM | POA: Diagnosis not present

## 2022-02-20 DIAGNOSIS — N186 End stage renal disease: Secondary | ICD-10-CM | POA: Diagnosis not present

## 2022-02-20 LAB — RENAL FUNCTION PANEL
Albumin: 2.5 g/dL — ABNORMAL LOW (ref 3.5–5.0)
Anion gap: 11 (ref 5–15)
BUN: 32 mg/dL — ABNORMAL HIGH (ref 6–20)
CO2: 24 mmol/L (ref 22–32)
Calcium: 9 mg/dL (ref 8.9–10.3)
Chloride: 104 mmol/L (ref 98–111)
Creatinine, Ser: 8.52 mg/dL — ABNORMAL HIGH (ref 0.44–1.00)
GFR, Estimated: 5 mL/min — ABNORMAL LOW (ref >60)
Glucose, Bld: 120 mg/dL — ABNORMAL HIGH (ref 70–99)
Phosphorus: 5.2 mg/dL — ABNORMAL HIGH (ref 2.5–4.6)
Potassium: 4 mmol/L (ref 3.5–5.1)
Sodium: 139 mmol/L (ref 135–145)

## 2022-02-20 LAB — CBC
HCT: 25.7 % — ABNORMAL LOW (ref 36.0–46.0)
Hemoglobin: 7.6 g/dL — ABNORMAL LOW (ref 12.0–15.0)
MCH: 29.7 pg (ref 26.0–34.0)
MCHC: 29.6 g/dL — ABNORMAL LOW (ref 30.0–36.0)
MCV: 100.4 fL — ABNORMAL HIGH (ref 80.0–100.0)
Platelets: 248 10*3/uL (ref 150–400)
RBC: 2.56 MIL/uL — ABNORMAL LOW (ref 3.87–5.11)
RDW: 14.8 % (ref 11.5–15.5)
WBC: 9.2 10*3/uL (ref 4.0–10.5)
nRBC: 0 % (ref 0.0–0.2)

## 2022-02-20 LAB — HEPATITIS B SURFACE ANTIGEN: Hepatitis B Surface Ag: NONREACTIVE

## 2022-02-20 MED ORDER — HEPARIN SODIUM (PORCINE) 1000 UNIT/ML DIALYSIS
1000.0000 [IU] | INTRAMUSCULAR | Status: DC | PRN
  Administered 2022-02-20: 1000 [IU]

## 2022-02-20 MED ORDER — PENTAFLUOROPROP-TETRAFLUOROETH EX AERO: 1.0000 | INHALATION_SPRAY | CUTANEOUS | Status: DC | PRN

## 2022-02-20 MED ORDER — ALTEPLASE 2 MG IJ SOLR: 2.0000 mg | Freq: Once | INTRAMUSCULAR | Status: DC | PRN

## 2022-02-20 MED ORDER — ANTICOAGULANT SODIUM CITRATE 4% (200MG/5ML) IV SOLN: 5.0000 mL | Status: DC | PRN

## 2022-02-20 MED ORDER — HEPARIN SODIUM (PORCINE) 1000 UNIT/ML DIALYSIS: 20.0000 [IU]/kg | INTRAMUSCULAR | Status: DC | PRN

## 2022-02-20 MED ORDER — HEPARIN SODIUM (PORCINE) 1000 UNIT/ML IJ SOLN
INTRAMUSCULAR | Status: AC
  Filled 2022-02-20: qty 3

## 2022-02-20 MED ORDER — HEPARIN SODIUM (PORCINE) 1000 UNIT/ML IJ SOLN
INTRAMUSCULAR | Status: AC
  Filled 2022-02-20: qty 4

## 2022-02-20 MED ORDER — LIDOCAINE-PRILOCAINE 2.5-2.5 % EX CREA: 1.0000 | TOPICAL_CREAM | CUTANEOUS | Status: DC | PRN

## 2022-02-20 MED ORDER — MIDODRINE HCL 5 MG PO TABS
10.0000 mg | ORAL_TABLET | Freq: Once | ORAL | Status: AC
  Administered 2022-02-20: 10 mg via ORAL

## 2022-02-20 MED ORDER — MIDODRINE HCL 5 MG PO TABS
ORAL_TABLET | ORAL | Status: AC
  Filled 2022-02-20: qty 2

## 2022-02-20 MED ORDER — LIDOCAINE HCL (PF) 1 % IJ SOLN: 5.0000 mL | INTRAMUSCULAR | Status: DC | PRN

## 2022-02-20 MED ORDER — HEPARIN SODIUM (PORCINE) 1000 UNIT/ML DIALYSIS: 1000.0000 [IU] | INTRAMUSCULAR | Status: DC | PRN

## 2022-02-20 NOTE — Progress Notes (Signed)
[facility] Progress Note   Subjective:  Seen in room. PT arranged meeting with seating specialist, Jason who will work with her on custom power chair/cushion. Should get demo cushions next week -she is very excited about possibility of regaining her independence.    Objective Vitals:   02/19/22 1745 02/19/22 2145 02/20/22 0443 02/20/22 0852  BP: (!) 121/51 (!) 148/60 (!) 138/46 (!) 120/52  Pulse: 63 79 78 76  Resp: 17 18 18 16  Temp: 98.6 F (37 C) 98.9 F (37.2 C) 99.1 F (37.3 C) 98.7 F (37.1 C)  TempSrc:  Oral Oral Oral  SpO2: 100% 100%  100%  Weight:   116.1 kg   Height:       Physical Exam General: Obese female, alert and in NAD Heart: RRR, no murmurs, rubs or gallops Lungs: CTA bilaterally without wheezing, rhonchi or rales Abdomen: Soft, non-distended, +BS Extremities: No edema b/l hips Dialysis Access: TDC with intact dressing    Additional Objective Labs: Basic Metabolic Panel: Recent Labs  Lab 02/13/22 1754 02/17/22 0327  NA 137 138  K 4.8 4.2  CL 98 102  CO2 24 25  GLUCOSE 138* 88  BUN 52* 25*  CREATININE 9.80* 6.27*  CALCIUM 9.8 9.7  PHOS 6.4* 5.1*    Liver Function Tests: Recent Labs  Lab 02/13/22 1754 02/17/22 0327  ALBUMIN 2.7* 2.8*    No results for input(s): "LIPASE", "AMYLASE" in the last 168 hours. CBC: Recent Labs  Lab 02/13/22 1754 02/17/22 0327  WBC 9.7 10.0  HGB 7.3* 8.2*  HCT 24.1* 27.6*  MCV 99.2 98.9  PLT 271 271    Blood Culture    Component Value Date/Time   SDES BLOOD LEFT ANTECUBITAL 10/16/2021 1534   SPECREQUEST  10/16/2021 1534    BOTTLES DRAWN AEROBIC AND ANAEROBIC Blood Culture results may not be optimal due to an inadequate volume of blood received in culture bottles   CULT  10/16/2021 1534    NO GROWTH 5 DAYS Performed at [facility], [address] [place],  [address]    REPTSTATUS 10/21/2021 FINAL 10/16/2021 1534    Cardiac Enzymes: No results for input(s):  "CKTOTAL", "CKMB", "CKMBINDEX", "TROPONINI" in the last 168 hours. CBG: No results for input(s): "GLUCAP" in the last 168 hours. Iron Studies: No results for input(s): "IRON", "TIBC", "TRANSFERRIN", "FERRITIN" in the last 72 hours. @lablastinr3@ Studies/Results: No results found. Medications:  [START ON 02/21/2022] sodium thiosulfate 25 g in sodium chloride 0.9 % 200 mL Infusion for Calciphylaxis      aspirin EC  81 mg Oral Daily   atorvastatin  10 mg Oral Daily   darbepoetin (ARANESP) injection - DIALYSIS  200 mcg Subcutaneous Q Tue-1800   diphenhydrAMINE  25 mg Oral QHS   docusate sodium  100 mg Oral BID   famotidine  20 mg Oral BID   feeding supplement  1 Container Oral TID BM   gabapentin  100 mg Oral Q1200   gabapentin  200 mg Oral QHS   lactulose  20 g Oral Daily   levothyroxine  100 mcg Oral Q0600   loratadine  10 mg Oral Daily   midodrine  10 mg Oral Q M,W,F-HD   pantoprazole  40 mg Oral Daily   sevelamer carbonate  4.8 g Oral TID WC    Assessment/Plan: Inability to obtain HD at OP center D/T body habitus and wound. Difficult situation with no clear answers yet. PT working with pt -  met with seating   specialist to get evaluated for custom chair/cushion that will allow her sit for longer periods.  Appreciate heroic efforts from PT team.  ESRD: Continue HD on MWF schedule. New TDC placed 02/04/2022.  Had HD off schedule Tues. Refused Wed, so next HD Thursday. Try to get back on schedule Friday.  Painful nodular lesions in abdomen. Dr. 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 Surgery will not be able to do biopsy any time soon. Will treat with sodium thiosulfate empirically.  Not on any calcium based products or vitamin D.  HypoTN/volume- On midodrine 10mg qHD, with 10mg prn mid HD w/parameters to keep SBP >100 d/t becoming symptomatic when it drops lower.  Significant weight loss during admission.  Remains euvolemic on exam.  Severe ischial sacral decubitus wound:  Limiting her, noted to be slow healing.  Goal to tolerate sitting upright - which she is doing with PT  COPD history of hypoxia on [place] O2 at home (was on 5 L at baseline has been able to taper to 2-3 L [place]).  Anemia of ESRD - Hgb 8.2 on max dose Aranesp 200mcg weekly. Recent Fe load completed.  Secondary hyperparathyroidism: Phos still up but improved - continue Renvela 4.8g AC TID. Calcium elevated. Not on VDRA, last PTH 60 on 01/30/22.  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 Nutrition - diet continues to be changed from renal.  Currently on heart healthy diet.  Stay on renal diet.  Disposition: Difficult situation. Prior to admit, patient required 6 staff members and 2 hoyer pads for transfer into HD chair as OP. Could not stay on HD for full tx. 2/2 pain of multiple decubitus ulcers and PTAR unable to lift pt into ambulance for transport.SW working on LTC at SNF Will need to tolerate 5-6hrs in a chair to tolerate OP dialysis including transport, treatment and cannulation time.  The main issue is her Rt ischial wound. PT has arranged seating specialist with plan for custom power chair/cushion that would keep pressure off wounds and allow her to sit for longer periods.   Grace  PA-C  [facility] 02/20/2022,3:33 PM   

## 2022-02-20 NOTE — Progress Notes (Signed)
Physical Therapy Treatment Patient Details Name: Courtney Gray MRN: [id number] DOB: 1970/12/11 Today's Date: 02/20/2022   History of Present Illness Pt adm 7/28 with acute pulmonary edema; large sacral ulcer limiting sitting tolerance which makes SNF placement difficult for HD. 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    PT Comments    Seen during specialized seating evaluation with [place] in open discussion with patient about cushions, w/c, and impact on independence and achieving current goals. Patient with improved mood and excited about opportunity to become independent with mobility. Engaged with patient after [employer] rep left room about importance of independence in her life and her long term goals. Plan is for demo power w/c and various cushions to be delivered on Tuesday 11/21 for patient to trial during PT session with use of hoyer lift. Patient eager for next session.    Recommendations for follow up therapy are one component of a multi-disciplinary discharge planning process, led by the attending physician.  Recommendations may be updated based on patient status, additional functional criteria and insurance authorization.  Follow Up Recommendations  Skilled nursing-short term rehab (<3 hours/day) Can patient physically be transported by private vehicle: No   Assistance Recommended at Discharge Intermittent Supervision/Assistance  Patient can return home with the following Two people to help with walking and/or transfers;Two people to help with bathing/dressing/bathroom;Assist for transportation;Assistance with cooking/housework;Help with stairs or ramp for entrance   Equipment Recommendations  Wheelchair (measurements PT);Wheelchair cushion (measurements PT);Other (comment);Hospital bed (more accomodating mechanical lift, with sling that handles pt's with bil AKAs; It may be  time to consider a specialty seating and wheelchair consultation)    Recommendations for Other Services       Precautions / Restrictions Precautions Precautions: Fall Precaution Comments: bil AKA, 2L O2 at baseline     Mobility  Bed Mobility               General bed mobility comments: pulls to long sit with bedrails    Transfers                        Ambulation/Gait                   Stairs             Wheelchair Mobility    Modified Rankin (Stroke Patients Only)       Balance                                            Cognition Arousal/Alertness: Awake/alert Behavior During Therapy: WFL for tasks assessed/performed Overall Cognitive Status: Within Functional Limits for tasks assessed                                          Exercises      General Comments General comments (skin integrity, edema, etc.): meeting with [employer] about power w/c and specialized seating. Assisted with discussion on ideas and benefits of power mobility to improve independence and overall mobility to achieve current goals      Pertinent Vitals/Pain Pain Assessment Pain Assessment: No/denies pain    Home Living  Prior Function            PT Goals (current goals can now be found in the care plan section) Acute Rehab PT Goals PT Goal Formulation: With patient Time For Goal Achievement: 02/20/22 Potential to Achieve Goals: Good Progress towards PT goals: Progressing toward goals    Frequency    Min 2X/week      PT Plan Current plan remains appropriate    Co-evaluation              AM-PAC PT "6 Clicks" Mobility   Outcome Measure  Help needed turning from your back to your side while in a flat bed without using bedrails?: None Help needed moving from lying on your back to sitting on the side of a flat bed without using bedrails?: None Help needed moving  to and from a bed to a chair (including a wheelchair)?: Total Help needed standing up from a chair using your arms (e.g., wheelchair or bedside chair)?: Total Help needed to walk in hospital room?: Total Help needed climbing 3-5 steps with a railing? : Total 6 Click Score: 12    End of Session Equipment Utilized During Treatment: Oxygen Activity Tolerance: Patient tolerated treatment well Patient left: in bed;with call bell/phone within reach   PT Visit Diagnosis: Other abnormalities of gait and mobility (R26.89);Muscle weakness (generalized) (M62.81) Pain - Right/Left: Right Pain - part of body: Hip;Leg     Time: 6599-3570 PT Time Calculation (min) (ACUTE ONLY): 74 min  Charges:  $Therapeutic Activity: 68-82 mins                     Caleen Taaffe A. Gilford Rile PT, DPT Acute Rehabilitation Services Office [phone]    [name] 02/20/2022, 4:19 PM

## 2022-02-20 NOTE — Progress Notes (Signed)
   02/20/22 2120  Vitals  Temp 98.1 F (36.7 C)  BP (!) 127/38  Pulse Rate 100  Resp 18  Post Treatment  Dialyzer Clearance Lightly streaked  Duration of HD Treatment -hour(s) 4 hour(s)  Liters Processed 83.7  Fluid Removed (mL) 2000 mL  Tolerated HD Treatment Yes   TX fin. W/o difficulty.

## 2022-02-20 NOTE — Progress Notes (Signed)
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, hepatic and splenic abscess --required amputation of third digit of left hand and underwent left AKA and had splenic drain as well as CT-guided drainage of liver abscess she had a line holiday on last admission on long-term IV antibiotics until 1/0/2585 diastolic CHF, PVD s/p bilateral AKA, IDDM-2, morbid obesity and  chronic sacral wound presenting with progressive shortness of breath in the setting of missed dialysis and admitted for hypertensive emergency  Per patient, she never missed her dialysis however according to outpatient HD records, it takes at least 4 staff members and [facility] lift for patient to be seated in the dialysis chair.    Symptoms improved with hemodialysis.  Patient underwent R UE venogram on 8/30.  Vascular surgery following for possible RUE HD access.    Patient is now waiting placement where she can get HD on stretcher that has been difficult to find.  Assessment and Plan: End stage renal disease [facility]) Patient will continue renal replacement therapy per nephrology recommendations.  Needs placement to continue with HD as outpatient.  Very deconditioned, large sacral decubitus wound.   Continue renal replacement therapy per nephrology recommendations.   Anemia of chronic kidney disease, Hgb has been stable.  Continue with EPO and Iron supplementation.   Metabolic bone disease, continue with sevelamer.   Hyperkalemia, hypomagnesemia, hypokalemia, hyponatremia, hypophosphatemia have been corrected with renal replacement therapy.   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 Empiric treatment.   Hypertension Patient on midodrine on HD to prevent hypotension. Currently off antihypertensive  medications.   Decubitus ulcer of sacral region, stage 3 (HCC) Deep wound at the sacrum, patient not able to seat.   Continue with oral oxycodone. Continue with local wound care.    Type 2 diabetes mellitus with hyperlipidemia (HCC) Her glucose has been stable and insulin therapy has been discontinued.   Statin therapy with atorvastatin.   Bacteremia Patient has completed antibiotic therapy. Bacteroides, splenic abscess.   PVD (peripheral vascular disease) (HCC) Bilateral AKA.   Continue local wound care.    Class 3 obesity (HCC) Calculated BMI is 42.6  Acute on chronic diastolic CHF (congestive heart failure) (HCC) Volume regulation with ultrafiltration in hemodialysis Continue midodrine for blood pressure support.   Nodule of skin of abdomen Noted to have areas of painful nodularity in her right flank as well as anterior left lower pannus.  These areas are mildly warm, but tender to palpation.  Tenderness has been present for 2 weeks, but she feels it is getting worse.  She has been afebrile and wbc count normal. CT abdomen did not show any underlying skin abscess. Discussed with Nephrology, Dr. 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 Recommended Gen surgery see for biopsy. I reached out to Saverio Danker, [place] with Gen Surgery, who will discuss biopsy logistics with Dr. Jonnie Finner.        Subjective: Patient with no chest pain or dyspnea,   Physical Exam: Vitals:   02/20/22 0852 02/20/22 1550 02/20/22 1630 02/20/22 1700  BP: (!) 120/52 (!) 138/58 (!) 100/47 128/68  Pulse: 76 80 86 71  Resp: 16 17 12 11   Temp: 98.7 F (37.1 C) 97.7 F (36.5 C)    TempSrc: Oral     SpO2: 100% 99% 97% 100%  Weight:  Height:       Neurology awake and alert ENT with mild pallor Cardiovascular with S1 and S2 present and rhythmic Respiratory with no rales  Abdomen not distended Bilateral AKA Data Reviewed:    Family Communication: no family at the  bedside   Disposition: Status is: Inpatient Remains inpatient appropriate because: pending placement   Planned Discharge Destination:  to be determined     Author: Tawni Millers, MD 02/20/2022 5:03 PM  For on call review [email or web address].

## 2022-02-21 DIAGNOSIS — N186 End stage renal disease: Secondary | ICD-10-CM | POA: Diagnosis not present

## 2022-02-21 DIAGNOSIS — I5023 Acute on chronic systolic (congestive) heart failure: Secondary | ICD-10-CM | POA: Diagnosis not present

## 2022-02-21 DIAGNOSIS — E1169 Type 2 diabetes mellitus with other specified complication: Secondary | ICD-10-CM | POA: Diagnosis not present

## 2022-02-21 DIAGNOSIS — I10 Essential (primary) hypertension: Secondary | ICD-10-CM | POA: Diagnosis not present

## 2022-02-21 LAB — HEPATITIS B SURFACE ANTIBODY, QUANTITATIVE: Hep B S AB Quant (Post): 3.7 m[IU]/mL — ABNORMAL LOW (ref >9.9)

## 2022-02-21 MED ORDER — SODIUM THIOSULFATE 250 MG/ML IV SOLN
25.0000 g | INTRAVENOUS | Status: DC | PRN
  Administered 2022-02-22: 25 g via INTRAVENOUS
  Filled 2022-02-21: qty 100

## 2022-02-21 MED ORDER — FAMOTIDINE 20 MG PO TABS
20.0000 mg | ORAL_TABLET | Freq: Every day | ORAL | Status: DC
  Administered 2022-02-22 – 2022-05-15 (×80): 20 mg via ORAL
  Filled 2022-02-21 (×83): qty 1

## 2022-02-21 MED ORDER — CHLORHEXIDINE GLUCONATE CLOTH 2 % EX PADS: 6.0000 | MEDICATED_PAD | Freq: Every day | CUTANEOUS | Status: DC

## 2022-02-21 NOTE — Plan of Care (Signed)
CHANGE IN DIALYSIS SCHEDULE DUE TO HOLIDAY  Courtney Gray 06-11-1970 [id number]  Patient dialysis schedule for the week of 02/23/22-03/02/22 varies from their normal schedule due to Thanksgiving Holiday.  Need to keep this in mind for discharge planning.  The Holiday schedule is as follow:   Normal HD Schedule: Monday Wednesday Friday Thanksgiving schedule:Sunday, Tuesday, Friday  They will resume their normal schedule on 03/03/22.     Jen Mow, PA-C [facility] Pager: [phone]

## 2022-02-21 NOTE — Plan of Care (Signed)
  Problem: Pain Managment: Goal: General experience of comfort will improve Outcome: Progressing   Problem: Safety: Goal: Ability to remain free from injury will improve Outcome: Progressing   Problem: Skin Integrity: Goal: Risk for impaired skin integrity will decrease Outcome: Progressing   

## 2022-02-21 NOTE — Progress Notes (Signed)
Progress Note   Patient: Courtney Gray DOB: [birth year] DOA: 11/01/2021     112 DOS: the patient was seen and examined on 02/21/2022   Brief hospital course: 51 year old F ESRD on HD TTS,  recent lengthy hospitalization for Bacteroides Thetaiotaomicron bacteremia, hepatic and splenic abscess --required amputation of third digit of left hand and underwent left AKA and had splenic drain as well as CT-guided drainage of liver abscess she had a line holiday on last admission on long-term IV antibiotics until 07/07/6832 diastolic CHF, PVD s/p bilateral AKA, IDDM-2, morbid obesity and  chronic sacral wound presenting with progressive shortness of breath in the setting of missed dialysis and admitted for hypertensive emergency  Per patient, she never missed her dialysis however according to outpatient HD records, it takes at least 4 staff members and [facility] lift for patient to be seated in the dialysis chair.    Symptoms improved with hemodialysis.  Patient underwent R UE venogram on 8/30.  Vascular surgery following for possible RUE HD access.    Patient is now waiting placement where she can get HD on stretcher that has been difficult to find.  Assessment and Plan: End stage renal disease [facility]) Patient will continue renal replacement therapy per nephrology recommendations.  Needs placement to continue with HD as outpatient.  Very deconditioned, large sacral decubitus wound.   Continue renal replacement therapy per nephrology recommendations.   Anemia of chronic kidney disease, Hgb has been stable.  Continue with EPO and Iron supplementation.   Metabolic bone disease, continue with sevelamer.   Hyperkalemia, hypomagnesemia, hypokalemia, hyponatremia, hypophosphatemia have been corrected with renal replacement therapy.   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 Empiric treatment.   Hypertension Patient on midodrine on HD to prevent hypotension. Currently off antihypertensive  medications.   Decubitus ulcer of sacral region, stage 3 (HCC) Deep wound at the sacrum, patient not able to seat.   Continue with oral oxycodone. Continue with local wound care.    Type 2 diabetes mellitus with hyperlipidemia (HCC) Her glucose has been stable and insulin therapy has been discontinued.   Statin therapy with atorvastatin.   Bacteremia Patient has completed antibiotic therapy. Bacteroides, splenic abscess.   PVD (peripheral vascular disease) (HCC) Bilateral AKA.   Continue local wound care.    Class 3 obesity (HCC) Calculated BMI is 42.6  Acute on chronic diastolic CHF (congestive heart failure) (HCC) Volume regulation with ultrafiltration in hemodialysis Continue midodrine for blood pressure support.   Nodule of skin of abdomen Noted to have areas of painful nodularity in her right flank as well as anterior left lower pannus.  These areas are mildly warm, but tender to palpation.  Tenderness has been present for 2 weeks, but she feels it is getting worse.  She has been afebrile and wbc count normal. CT abdomen did not show any underlying skin abscess. Discussed with Nephrology, Dr. 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 Recommended Gen surgery see for biopsy. I reached out to Saverio Danker, [place] with Gen Surgery, who will discuss biopsy logistics with Dr. Jonnie Finner.        Subjective: Patient has been working with PT, no dyspnea or chest pain   Physical Exam: Vitals:   02/20/22 2215 02/21/22 0610 02/21/22 0946 02/21/22 1243  BP: (!) 144/69 (!) 126/58 (!) 106/53 (!) 131/46  Pulse: 74 96 76   Resp: 20 18 18    Temp: 98.7 F (37.1 C) 99.5 F (37.5 C) 98.5 F (36.9 C)   TempSrc: Oral Oral Oral  SpO2: 100% 100% 96%   Weight:      Height:       Neurology awake and alert ENT with no pallor Cardiovascular with S1 and S2 present and rhythmic Respiratory with no rales or wheezing Abdomen with no distention  Bilateral AKA  Data  Reviewed:    Family Communication: no family at the bedside   Disposition: Status is: Inpatient Remains inpatient appropriate because: pending placement   Planned Discharge Destination:  to be determined      Author: Tawni Millers, MD 02/21/2022 5:28 PM  For on call review [email or web address].

## 2022-02-21 NOTE — Progress Notes (Signed)
[facility] Progress Note   Subjective:   Patient seen and examined at bedside.  Reports refusing HD today due to going yesterday and not tolerating back to back treatments in the past.  Currently tired not sleeping well. Medication for sleep either does not work or too effective.   **This patient will not be seen over this weekend (11/18-11/19/23) unless emergency. If any renal/dialysis needs from 7a-5p, please page Jen Mow, PA-C [phone]**  Objective Vitals:   02/20/22 2215 02/21/22 0610 02/21/22 0946 02/21/22 1243  BP: (!) 144/69 (!) 126/58 (!) 106/53 (!) 131/46  Pulse: 74 96 76   Resp: _0 Temp: 98.7 F (37.1 C) 99.5 F (37.5 C) 98.5 F (36.9 C)   TempSrc: Oral Oral Oral   SpO2: 100% 100% 96%   Weight:      Height:       Physical Exam General:well appearing female in NAD Heart:RRR Lungs:symmetric chest rise, nml WOB on 2L O2 via [place] Abdomen:soft Extremities:b/l AKA Dialysis Access: [facility]   Filed Weights   02/18/22 0936 02/20/22 0443 02/20/22 2120  Weight: 116.1 kg 116.1 kg 114.1 kg    Intake/Output Summary (Last 24 hours) at 02/21/2022 1552 Last data filed at 02/21/2022 1300 Gross per 24 hour  Intake 300 ml  Output 2000 ml  Net -1700 ml    Additional Objective Labs: Basic Metabolic Panel: Recent Labs  Lab 02/17/22 0327 02/20/22 1554  NA 138 139  K 4.2 4.0  CL 102 104  CO2 25 24  GLUCOSE 88 120*  BUN 25* 32*  CREATININE 6.27* 8.52*  CALCIUM 9.7 9.0  PHOS 5.1* 5.2*   Liver Function Tests: Recent Labs  Lab 02/17/22 0327 02/20/22 1554  ALBUMIN 2.8* 2.5*    CBC: Recent Labs  Lab 02/17/22 0327 02/20/22 1554  WBC 10.0 9.2  HGB 8.2* 7.6*  HCT 27.6* 25.7*  MCV 98.9 100.4*  PLT 271 248    Medications:  sodium thiosulfate 25 g in sodium chloride 0.9 % 200 mL Infusion for Calciphylaxis      aspirin EC  81 mg Oral Daily   atorvastatin  10 mg Oral Daily   Chlorhexidine Gluconate Cloth  6 each Topical Q0600    darbepoetin (ARANESP) injection - DIALYSIS  200 mcg Subcutaneous Q Tue-1800   diphenhydrAMINE  25 mg Oral QHS   docusate sodium  100 mg Oral BID   [START ON 02/22/2022] famotidine  20 mg Oral Daily   feeding supplement  1 Container Oral TID BM   gabapentin  100 mg Oral Q1200   gabapentin  200 mg Oral QHS   lactulose  20 g Oral Daily   levothyroxine  100 mcg Oral Q0600   loratadine  10 mg Oral Daily   midodrine  10 mg Oral Q M,W,F-HD   pantoprazole  40 mg Oral Daily   sevelamer carbonate  4.8 g Oral TID WC    Assessment/Plan: Inability to obtain HD at OP center D/T body habitus and wound. Difficult situation with no clear answers yet. PT working with pt -  met with seating specialist to get evaluated for custom chair/cushion that will allow her sit for longer periods.  Appreciate heroic efforts from PT team.  ESRD: Continue HD on MWF schedule. New TDC placed 02/04/2022.  HD off schedule this week due to not wanting back to back treatments.  Continue TTS schedule this week.  Will resume MWF next week and not follow holiday schedule.  Encouraged compliance.  Discussed w/HD manager patient's concerns.  Painful nodular lesions in abdomen. 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 Surgery will not be able to do biopsy any time soon. Will treat with sodium thiosulfate empirically.  Having issues w/dosing due to patient non compliance/off schedule.  Talked to Pharmacy and plan to have nurse call down to order when patient comes up to dialysis. Not on any calcium based products or vitamin D.  HypoTN/volume- On midodrine 5m qHD, with 188mprn mid HD w/parameters to keep SBP >100 d/t becoming symptomatic when it drops lower.  Significant weight loss during admission.  Remains euvolemic on exam.  Severe ischial sacral decubitus wound: Limiting her, noted to be slow healing.  Goal to tolerate sitting upright - which she is doing with PT  COPD history of hypoxia on [place] O2 at home (was on 5 L  at baseline has been able to taper to 2-3 L [place]).  Anemia of ESRD - Hgb 7.6 on max dose Aranesp 20021mweekly. Recent Fe load completed.  Secondary hyperparathyroidism: Phos now at goal - continue Renvela 4.8g AC TID. Calcium elevated. Not on VDRA, last PTH 60 on 01/30/22.  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 Nutrition - diet continues to be changed from renal.  Currently on regular diet.  Should stay on renal diet.  Disposition: Difficult situation. Prior to admit, patient required 6 staff members and 2 hoyer pads for transfer into HD chair as OP. Could not stay on HD for full tx. 2/2 pain of multiple decubitus ulcers and PTAR unable to lift pt into ambulance for transport.SW working on LTC at [employer] need to tolerate 5-6hrs in a chair to tolerate OP dialysis including transport, treatment and cannulation time.  The main issue is her Rt ischial wound. PT has arranged seating specialist with plan for custom power chair/cushion that would keep pressure off wounds and allow her to sit for longer periods.      LinJen MowA-C [facility] 02/21/2022,3:52 PM  LOS: 112 days

## 2022-02-21 NOTE — Progress Notes (Addendum)
Pt states she is not going to dialysis today because "I just went yesterday and I did not sleep last night so I need today to rest. I will go tomorrow." Gerome Apley, MD  and Roney Jaffe, MD made aware. Verdene Lennert, dialysis charge nurse notified.

## 2022-02-22 DIAGNOSIS — E1169 Type 2 diabetes mellitus with other specified complication: Secondary | ICD-10-CM | POA: Diagnosis not present

## 2022-02-22 DIAGNOSIS — I10 Essential (primary) hypertension: Secondary | ICD-10-CM | POA: Diagnosis not present

## 2022-02-22 DIAGNOSIS — I5023 Acute on chronic systolic (congestive) heart failure: Secondary | ICD-10-CM | POA: Diagnosis not present

## 2022-02-22 DIAGNOSIS — N186 End stage renal disease: Secondary | ICD-10-CM | POA: Diagnosis not present

## 2022-02-22 LAB — RENAL FUNCTION PANEL
Albumin: 2.6 g/dL — ABNORMAL LOW (ref 3.5–5.0)
Anion gap: 11 (ref 5–15)
BUN: 32 mg/dL — ABNORMAL HIGH (ref 6–20)
CO2: 27 mmol/L (ref 22–32)
Calcium: 9.3 mg/dL (ref 8.9–10.3)
Chloride: 99 mmol/L (ref 98–111)
Creatinine, Ser: 8.2 mg/dL — ABNORMAL HIGH (ref 0.44–1.00)
GFR, Estimated: 5 mL/min — ABNORMAL LOW (ref >60)
Glucose, Bld: 133 mg/dL — ABNORMAL HIGH (ref 70–99)
Phosphorus: 5.8 mg/dL — ABNORMAL HIGH (ref 2.5–4.6)
Potassium: 4.3 mmol/L (ref 3.5–5.1)
Sodium: 137 mmol/L (ref 135–145)

## 2022-02-22 LAB — CBC
HCT: 27 % — ABNORMAL LOW (ref 36.0–46.0)
Hemoglobin: 8.1 g/dL — ABNORMAL LOW (ref 12.0–15.0)
MCH: 29.6 pg (ref 26.0–34.0)
MCHC: 30 g/dL (ref 30.0–36.0)
MCV: 98.5 fL (ref 80.0–100.0)
Platelets: 269 10*3/uL (ref 150–400)
RBC: 2.74 MIL/uL — ABNORMAL LOW (ref 3.87–5.11)
RDW: 14.8 % (ref 11.5–15.5)
WBC: 11.4 10*3/uL — ABNORMAL HIGH (ref 4.0–10.5)
nRBC: 0 % (ref 0.0–0.2)

## 2022-02-22 MED ORDER — ANTICOAGULANT SODIUM CITRATE 4% (200MG/5ML) IV SOLN: 5.0000 mL | Status: DC | PRN

## 2022-02-22 MED ORDER — ALTEPLASE 2 MG IJ SOLR: 2.0000 mg | Freq: Once | INTRAMUSCULAR | Status: DC | PRN

## 2022-02-22 MED ORDER — HEPARIN SODIUM (PORCINE) 1000 UNIT/ML DIALYSIS
1000.0000 [IU] | INTRAMUSCULAR | Status: DC | PRN
  Administered 2022-02-23: 1000 [IU]

## 2022-02-22 NOTE — Progress Notes (Signed)
Progress Note   Patient: Courtney Gray OMV:[id number] DOB: [birth year] DOA: 11/01/2021     113 DOS: the patient was seen and examined on 02/22/2022   Brief hospital course: 51 year old F ESRD on HD TTS,  recent lengthy hospitalization for Bacteroides Thetaiotaomicron bacteremia, hepatic and splenic abscess --required amputation of third digit of left hand and underwent left AKA and had splenic drain as well as CT-guided drainage of liver abscess she had a line holiday on last admission on long-term IV antibiotics until 09/06/8364 diastolic CHF, PVD s/p bilateral AKA, IDDM-2, morbid obesity and  chronic sacral wound presenting with progressive shortness of breath in the setting of missed dialysis and admitted for hypertensive emergency  Per patient, she never missed her dialysis however according to outpatient HD records, it takes at least 4 staff members and [facility] lift for patient to be seated in the dialysis chair.    Symptoms improved with hemodialysis.  Patient underwent R UE venogram on 8/30.  Vascular surgery following for possible RUE HD access.    Patient is now waiting placement where she can get HD on stretcher that has been difficult to find.  Assessment and Plan: End stage renal disease [facility]) Patient will continue renal replacement therapy per nephrology recommendations.  Needs placement to continue with HD as outpatient.  Very deconditioned, large sacral decubitus wound.   Continue renal replacement therapy per nephrology recommendations.   Anemia of chronic kidney disease, Hgb has been stable.  Continue with EPO and Iron supplementation.   Metabolic bone disease, continue with sevelamer.   Hyperkalemia, hypomagnesemia, hypokalemia, hyponatremia, hypophosphatemia have been corrected with renal replacement therapy.   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 Empiric treatment.   Hypertension Patient on midodrine on HD to prevent hypotension. Currently off antihypertensive  medications.   Decubitus ulcer of sacral region, stage 3 (HCC) Deep wound at the sacrum, patient not able to seat.   Continue with oral oxycodone. Continue with local wound care.    Type 2 diabetes mellitus with hyperlipidemia (HCC) Her glucose has been stable and insulin therapy has been discontinued.   Statin therapy with atorvastatin.   Bacteremia Patient has completed antibiotic therapy. Bacteroides, splenic abscess.   PVD (peripheral vascular disease) (HCC) Bilateral AKA.   Continue local wound care.    Class 3 obesity (HCC) Calculated BMI is 42.6  Acute on chronic diastolic CHF (congestive heart failure) (HCC) Volume regulation with ultrafiltration in hemodialysis Continue midodrine for blood pressure support.   Nodule of skin of abdomen Noted to have areas of painful nodularity in her right flank as well as anterior left lower pannus.  These areas are mildly warm, but tender to palpation.  Tenderness has been present for 2 weeks, but she feels it is getting worse.  She has been afebrile and wbc count normal. CT abdomen did not show any underlying skin abscess. Discussed with Nephrology, Dr. 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 Recommended Gen surgery see for biopsy. I reached out to Saverio Danker, [place] with Gen Surgery, who will discuss biopsy logistics with Dr. Jonnie Finner.        Subjective: patient refused HD this am, no dyspnea or chest pain   Physical Exam: Vitals:   02/21/22 2029 02/21/22 2145 02/22/22 0602 02/22/22 0848  BP: (!) 143/43 (!) 133/56 (!) 108/92 110/87  Pulse: 66 69 69 63  Resp: 18  18 16   Temp: 99 F (37.2 C) 98.4 F (36.9 C) 98.5 F (36.9 C) 99.1 F (37.3 C)  TempSrc: Oral Oral Oral  Oral  SpO2: 99% 100%    Weight:      Height:       Neurology awake and alert ENT with mild pallor Cardiovascular with S1 and S2 present and rhythmic Respiratory with no rales Abdomen with no distention Bilateral AKA Data  Reviewed:    Family Communication: no family at the bedside   Disposition: Status is: Inpatient   Planned Discharge Destination:      Author: Tawni Millers, MD 02/22/2022 1:21 PM  For on call review [email or web address].

## 2022-02-22 NOTE — Progress Notes (Signed)
Delay in HD treatment; pt refusing to come before eating breakfast

## 2022-02-22 NOTE — Progress Notes (Signed)
Patient refusing to go to dialysis... states she hasn't gotten a tray yet and ordered it last night.  Dietary states no order was placed.  Refusing dialysis at this time.

## 2022-02-22 NOTE — Progress Notes (Signed)
HD delay; pt needing to reorder breakfast. Charge informed this rn nursing staff informed her of pt statement dialysis says they don't have room for her but they always make room

## 2022-02-23 DIAGNOSIS — I10 Essential (primary) hypertension: Secondary | ICD-10-CM | POA: Diagnosis not present

## 2022-02-23 DIAGNOSIS — I5023 Acute on chronic systolic (congestive) heart failure: Secondary | ICD-10-CM | POA: Diagnosis not present

## 2022-02-23 DIAGNOSIS — N186 End stage renal disease: Secondary | ICD-10-CM | POA: Diagnosis not present

## 2022-02-23 MED ORDER — HEPARIN SODIUM (PORCINE) 1000 UNIT/ML IJ SOLN
INTRAMUSCULAR | Status: AC
  Filled 2022-02-23: qty 4

## 2022-02-23 MED ORDER — FENTANYL CITRATE PF 50 MCG/ML IJ SOSY
12.5000 ug | PREFILLED_SYRINGE | INTRAMUSCULAR | Status: DC | PRN
  Administered 2022-02-23: 12.5 ug via INTRAVENOUS
  Filled 2022-02-23: qty 1

## 2022-02-23 MED ORDER — CHLORHEXIDINE GLUCONATE CLOTH 2 % EX PADS
6.0000 | MEDICATED_PAD | Freq: Every day | CUTANEOUS | Status: DC
  Administered 2022-02-26 – 2022-04-22 (×11): 6 via TOPICAL

## 2022-02-23 MED ORDER — MELATONIN 3 MG PO TABS
3.0000 mg | ORAL_TABLET | Freq: Every day | ORAL | Status: DC
  Administered 2022-03-01 – 2022-03-12 (×4): 3 mg via ORAL
  Filled 2022-02-23 (×23): qty 1

## 2022-02-23 NOTE — Progress Notes (Signed)
Progress Note   Patient: Courtney Gray CWU:[id number] DOB: 14-Oct-1970 DOA: 11/01/2021     114 DOS: the patient was seen and examined on 02/23/2022   Brief hospital course: 51 year old F ESRD on HD TTS,  recent lengthy hospitalization for Bacteroides Thetaiotaomicron bacteremia, hepatic and splenic abscess --required amputation of third digit of left hand and underwent left AKA and had splenic drain as well as CT-guided drainage of liver abscess she had a line holiday on last admission on long-term IV antibiotics until 06/13/8826 diastolic CHF, PVD s/p bilateral AKA, IDDM-2, morbid obesity and  chronic sacral wound presenting with progressive shortness of breath in the setting of missed dialysis and admitted for hypertensive emergency  Per patient, she never missed her dialysis however according to outpatient HD records, it takes at least 4 staff members and [facility] lift for patient to be seated in the dialysis chair.    Symptoms improved with hemodialysis.  Patient underwent R UE venogram on 8/30.  Vascular surgery following for possible RUE HD access.    Patient is now waiting placement where she can get HD on stretcher that has been difficult to find.  Assessment and Plan: End stage renal disease [facility]) Patient will continue renal replacement therapy per nephrology recommendations.  Needs placement to continue with HD as outpatient.  Very deconditioned, large sacral decubitus wound.   Continue renal replacement therapy per nephrology recommendations.   Anemia of chronic kidney disease, Hgb has been stable.  Continue with EPO and Iron supplementation.   Metabolic bone disease, continue with sevelamer.   Hyperkalemia, hypomagnesemia, hypokalemia, hyponatremia, hypophosphatemia have been corrected with renal replacement therapy.   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 Empiric treatment.   Hypertension Patient on midodrine on HD to prevent hypotension. Currently off antihypertensive  medications.   Decubitus ulcer of sacral region, stage 3 (HCC) Deep wound at the sacrum, patient not able to seat.   Continue with oral oxycodone. Continue with local wound care.    Type 2 diabetes mellitus with hyperlipidemia (HCC) Her glucose has been stable and insulin therapy has been discontinued.   Statin therapy with atorvastatin.   Bacteremia Patient has completed antibiotic therapy. Bacteroides, splenic abscess.   PVD (peripheral vascular disease) (HCC) Bilateral AKA.   Continue local wound care.    Class 3 obesity (HCC) Calculated BMI is 42.6  Acute on chronic diastolic CHF (congestive heart failure) (HCC) Volume regulation with ultrafiltration in hemodialysis Continue midodrine for blood pressure support.   Nodule of skin of abdomen Noted to have areas of painful nodularity in her right flank as well as anterior left lower pannus.  These areas are mildly warm, but tender to palpation.  Tenderness has been present for 2 weeks, but she feels it is getting worse.  She has been afebrile and wbc count normal. CT abdomen did not show any underlying skin abscess. Discussed with Nephrology, Dr. 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 Recommended Gen surgery see for biopsy. I reached out to Saverio Danker, [place] with Gen Surgery, who will discuss biopsy logistics with Dr. Jonnie Finner.        Subjective: Patient with difficulty sleeping, no dyspnea or chest pain   Physical Exam: Vitals:   02/23/22 0052 02/23/22 0153 02/23/22 0559 02/23/22 1000  BP: (!) 137/40 (!) 145/47 130/73 (!) 117/52  Pulse: 100 70 68 67  Resp: (!) 22 19 18 17   Temp: 97.9 F (36.6 C) 98.5 F (36.9 C) 98.7 F (37.1 C) 98.2 F (36.8 C)  TempSrc: Oral Oral Oral  Oral  SpO2: 98% 100% 100% 100%  Weight: 112.7 kg     Height:       Neurology awake and alert ENT with mild pallor Cardiovascular with S1 and S2 present and rhythmic Respiratory with no wheezing Abdomen with no  distention, protuberant Bilateral AKA  Data Reviewed:    Family Communication: no family at the bedside   Disposition: Status is: Inpatient Remains inpatient appropriate because: pending placement   Planned Discharge Destination:  to be determined     Author: Tawni Millers, MD 02/23/2022 2:19 PM  For on call review [email or web address].

## 2022-02-23 NOTE — Progress Notes (Signed)
   02/23/22 0052  Vitals  Temp 97.9 F (36.6 C)  Temp Source Oral  BP (!) 137/40  MAP (mmHg) (!) 62  BP Location Right Arm  BP Method Automatic  Patient Position (if appropriate) Lying  Pulse Rate 100  Pulse Rate Source Monitor  ECG Heart Rate 69  Resp (!) 22  Post Treatment  Dialyzer Clearance Heavily streaked  Duration of HD Treatment -hour(s) 4 hour(s)  Fluid Removed (mL) 2900 mL  Tolerated HD Treatment Yes   TX fin w/ 100 ml back due to bad feeling of PTs light head.

## 2022-02-23 NOTE — Plan of Care (Signed)
  Problem: Coping: Goal: Level of anxiety will decrease Outcome: Progressing   Problem: Pain Managment: Goal: General experience of comfort will improve Outcome: Progressing   Problem: Safety: Goal: Ability to remain free from injury will improve Outcome: Progressing   

## 2022-02-24 DIAGNOSIS — L89153 Pressure ulcer of sacral region, stage 3: Secondary | ICD-10-CM | POA: Diagnosis not present

## 2022-02-24 DIAGNOSIS — I5023 Acute on chronic systolic (congestive) heart failure: Secondary | ICD-10-CM | POA: Diagnosis not present

## 2022-02-24 DIAGNOSIS — N186 End stage renal disease: Secondary | ICD-10-CM | POA: Diagnosis not present

## 2022-02-24 DIAGNOSIS — I10 Essential (primary) hypertension: Secondary | ICD-10-CM | POA: Diagnosis not present

## 2022-02-24 MED ORDER — HEPARIN SODIUM (PORCINE) 1000 UNIT/ML IJ SOLN
INTRAMUSCULAR | Status: AC
  Administered 2022-02-24: 4200 [IU]
  Filled 2022-02-24: qty 4

## 2022-02-24 MED ORDER — OXYCODONE HCL 5 MG PO TABS
5.0000 mg | ORAL_TABLET | ORAL | Status: AC | PRN
  Administered 2022-02-24 (×2): 5 mg via ORAL
  Filled 2022-02-24: qty 1

## 2022-02-24 MED ORDER — OXYCODONE HCL 5 MG PO TABS
5.0000 mg | ORAL_TABLET | Freq: Three times a day (TID) | ORAL | Status: DC | PRN
  Administered 2022-02-25: 5 mg via ORAL
  Filled 2022-02-24 (×2): qty 1

## 2022-02-24 NOTE — Progress Notes (Signed)
[facility] Progress Note   Subjective:  Seen in room - no new symptoms, feels ok. For HD today.  Objective Vitals:   02/23/22 1704 02/23/22 2133 02/24/22 0639 02/24/22 0943  BP: (!) 130/43 134/61 130/74 125/85  Pulse: (!) 52 76 73 68  Resp: _0 Temp: 98.1 F (36.7 C) 98.5 F (36.9 C) 98.4 F (36.9 C) 98.3 F (36.8 C)  TempSrc:  Oral Oral Oral  SpO2: 99% 100% 100% 100%  Weight:  112.7 kg    Height:       Physical Exam General: Well appearing, overweight woman, NAD. Nasal O2 in place. Heart: RRR; no murmur Lungs: CTA anteriorly Abdomen: soft Extremities: B AKA; no flank/hip edema Dialysis Access: [facility]  Additional Objective Labs: Basic Metabolic Panel: Recent Labs  Lab 02/20/22 1554 02/22/22 2050  NA 139 137  K 4.0 4.3  CL 104 99  CO2 24 27  GLUCOSE 120* 133*  BUN 32* 32*  CREATININE 8.52* 8.20*  CALCIUM 9.0 9.3  PHOS 5.2* 5.8*   Liver Function Tests: Recent Labs  Lab 02/20/22 1554 02/22/22 2050  ALBUMIN 2.5* 2.6*   CBC: Recent Labs  Lab 02/20/22 1554 02/22/22 2049  WBC 9.2 11.4*  HGB 7.6* 8.1*  HCT 25.7* 27.0*  MCV 100.4* 98.5  PLT 248 269   Medications:  sodium thiosulfate 25 g in sodium chloride 0.9 % 200 mL Infusion for Calciphylaxis Stopped (02/23/22 0036)    aspirin EC  81 mg Oral Daily   atorvastatin  10 mg Oral Daily   Chlorhexidine Gluconate Cloth  6 each Topical Q0600   darbepoetin (ARANESP) injection - DIALYSIS  200 mcg Subcutaneous Q Tue-1800   docusate sodium  100 mg Oral BID   famotidine  20 mg Oral Daily   feeding supplement  1 Container Oral TID BM   gabapentin  100 mg Oral Q1200   gabapentin  200 mg Oral QHS   lactulose  20 g Oral Daily   levothyroxine  100 mcg Oral Q0600   loratadine  10 mg Oral Daily   melatonin  3 mg Oral QHS   midodrine  10 mg Oral Q M,W,F-HD   pantoprazole  40 mg Oral Daily   sevelamer carbonate  4.8 g Oral TID WC    Dialysis Orders: No outpatient unit at this  time.  Assessment/Plan: Inability to obtain HD at OP center D/T body habitus and wound. Difficult situation with no clear answers yet. PT working with pt -  met with seating specialist to get evaluated for custom chair/cushion that will allow her sit for longer periods.  Appreciate heroic efforts from PT team.  ESRD: Usual TTS schedule - following Thanksgiving Holiday schedule this week (Mon/Wed/Sat) - for HD today. New TDC placed 02/04/2022.   Painful nodular lesions in abdomen: 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 Surgery will not be able to do biopsy any time soon. Treating with sodium thiosulfate empirically - although has missed some doses. Not on any calcium based products or vitamin D.  HypoTN/volume- On midodrine 3m qHD, with 147mprn mid HD w/parameters to keep SBP >100 d/t becoming symptomatic when it drops lower.  Significant weight loss during admission.  Remains euvolemic on exam.  Severe ischial sacral decubitus wound: Limiting her, noted to be slow healing. Goal to tolerate sitting upright - which she is doing with PT. COPD history of hypoxia on [place] O2 at home (was on 5 L at baseline has been able to  taper to 2-3 L [place]).  Anemia of ESRD - Hgb 8.1, on max dose Aranesp 200mcg weekly. Recent Fe load completed.  Secondary hyperparathyroidism: Phos borderline - continue Renvela 4.8g AC TID. CorrCa elevated. Not on VDRA, last PTH 60 on 01/30/22.  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 Nutrition - diet continues to be changed from renal.  Currently on regular diet.  Should stay on renal diet.  Disposition: Difficult situation. Prior to admit, patient required 6 staff members and 2 hoyer pads for transfer into HD chair as OP. Could not stay on HD for full tx. 2/2 pain of multiple decubitus ulcers and PTAR unable to lift pt into ambulance for transport.SW working on LTC at SNF Will need to tolerate 5-6hrs in a chair to tolerate OP dialysis including  transport, treatment and cannulation time.  The main issue is her Rt ischial wound. PT has arranged seating specialist with plan for custom power chair/cushion that would keep pressure off wounds and allow her to sit for longer periods.    Katie , PA-C 02/24/2022, 11:15 AM  [facility]    

## 2022-02-24 NOTE — Progress Notes (Signed)
Progress Note   Patient: Courtney Gray CXK:[id number] DOB: [birth year] DOA: 11/01/2021     115 DOS: the patient was seen and examined on 02/24/2022   Brief hospital course: 51 year old F ESRD on HD TTS,  recent lengthy hospitalization for Bacteroides Thetaiotaomicron bacteremia, hepatic and splenic abscess --required amputation of third digit of left hand and underwent left AKA and had splenic drain as well as CT-guided drainage of liver abscess she had a line holiday on last admission on long-term IV antibiotics until 12/12/261 diastolic CHF, PVD s/p bilateral AKA, IDDM-2, morbid obesity and  chronic sacral wound presenting with progressive shortness of breath in the setting of missed dialysis and admitted for hypertensive emergency  Per patient, she never missed her dialysis however according to outpatient HD records, it takes at least 4 staff members and [facility] lift for patient to be seated in the dialysis chair.    Symptoms improved with hemodialysis.  Patient underwent R UE venogram on 8/30.  Vascular surgery following for possible RUE HD access.    Patient is now waiting placement where she can get HD on stretcher that has been difficult to find.  Assessment and Plan: End stage renal disease [facility]) Patient will continue renal replacement therapy per nephrology recommendations.  Needs placement to continue with HD as outpatient.  Very deconditioned, large sacral decubitus wound.   Continue renal replacement therapy per nephrology recommendations.   Anemia of chronic kidney disease, Hgb has been stable.  Continue with EPO and Iron supplementation.   Metabolic bone disease, continue with sevelamer.   Hyperkalemia, hypomagnesemia, hypokalemia, hyponatremia, hypophosphatemia have been corrected with renal replacement therapy.   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 Empiric treatment with thiosulfate 11/18. Discontinued due to persistent nausea and vomiting.   Hypertension Patient on  midodrine on HD to prevent hypotension. Currently off antihypertensive medications.   Decubitus ulcer of sacral region, stage 3 (HCC) Deep wound at the sacrum, patient not able to seat.   Continue with oral oxycodone. Continue with local wound care.    Type 2 diabetes mellitus with hyperlipidemia (HCC) Her glucose has been stable and insulin therapy has been discontinued.   Statin therapy with atorvastatin.   Bacteremia Patient has completed antibiotic therapy. Bacteroides, splenic abscess.   PVD (peripheral vascular disease) (HCC) Bilateral AKA.   Continue local wound care.    Class 3 obesity (HCC) Calculated BMI is 42.6  Acute on chronic diastolic CHF (congestive heart failure) (HCC) Volume regulation with ultrafiltration in hemodialysis Continue midodrine for blood pressure support.   Nodule of skin of abdomen Noted to have areas of painful nodularity in her right flank as well as anterior left lower pannus.  These areas are mildly warm, but tender to palpation.  Tenderness has been present for 2 weeks, but she feels it is getting worse.  She has been afebrile and wbc count normal. CT abdomen did not show any underlying skin abscess. Discussed with Nephrology, Dr. 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 Recommended Gen surgery see for biopsy. I reached out to Saverio Danker, [place] with Gen Surgery, who will discuss biopsy logistics with Dr. Jonnie Finner.        Subjective: Patient with no chest pain or dyspnea, having hemodialysis   Physical Exam: Vitals:   02/24/22 1434 02/24/22 1504 02/24/22 1534 02/24/22 1604  BP: (!) 152/51 (!) 147/49 (!) 127/48 107/74  Pulse: 66 67 81 (!) 50  Resp: 12 10 15 16   Temp:      TempSrc:  SpO2:      Weight:      Height:       Neurology awake and alert ENT with mild pallor Cardiovascular with S1 and S2 present with no rubs Respiratory with no wheezing Abdomen with no distention  Bilateral AKA  Data  Reviewed:    Family Communication: no family at the bedside   Disposition: Status is: Inpatient Remains inpatient appropriate because: pending placement   Planned Discharge Destination: to be determined      Author: Tawni Millers, MD 02/24/2022 4:22 PM  For on call review [email or web address].

## 2022-02-24 NOTE — Progress Notes (Signed)
   02/24/22 1851  Pain Assessment  Pain Scale 0-10  Pain Score 0  Neurological  Level of Consciousness Alert  Orientation Level Oriented X4  Respiratory  Respiratory Pattern Regular;Unlabored  Chest Assessment Chest expansion symmetrical  Bilateral Breath Sounds Clear;Diminished  R Upper  Breath Sounds Clear;Diminished  L Upper Breath Sounds Clear;Diminished  R Lower Breath Sounds Clear;Diminished  L Lower Breath Sounds Clear;Diminished  Cough None  Cardiac  Pulse Regular  Heart Sounds S1, S2  Jugular Venous Distention (JVD) No

## 2022-02-24 NOTE — Progress Notes (Signed)
Physical Therapy Note  Plans for vendor to deliver a demo power wc tomorrow, 11/21, at [place];  Left a note with HD staff to give to [place] re: timing of demo wc delivery tomorrow;   Roney Marion, [place] Office [phone]

## 2022-02-24 NOTE — Progress Notes (Signed)
Patient complained of feeling sick post treatment on Saturday, 11/18 after taking Sodium Thiosulfate.  Patient has requested to hold dose today due to vomiting and being unable to eat since taking the med.  Dr. Joelyn Oms has been made aware and plans to visit patient on the [place] during her dialysis treatment.

## 2022-02-24 NOTE — Progress Notes (Signed)
   02/24/22 1836  Oxygen Therapy  Patient Activity (if Appropriate) In bed  O2 Device Nasal Cannula  Oxygen Therapy  O2 Flow Rate (L/min) 2 L/min  Vitals  SpO2 100 %  Temp 98 F (36.7 C)  Pulse Rate 68  Resp 18  BP 112/65   Received patient in bed to unit.  Alert and oriented.  Informed consent signed and in chart.   Treatment initiated: 1406p Treatment completed: 1851p  Patient tolerated well.  Transported back to the room  Alert, without acute distress.  Hand-off given to patient's nurse.   Access used: Yes Access issues: No  Total UF removed: 1.5L Medication(s) given: None Post HD VS: 98.0, 112/65, 68, Spo2 100 3L N/C Post HD weight: [facility]

## 2022-02-25 DIAGNOSIS — I5023 Acute on chronic systolic (congestive) heart failure: Secondary | ICD-10-CM | POA: Diagnosis not present

## 2022-02-25 DIAGNOSIS — N186 End stage renal disease: Secondary | ICD-10-CM | POA: Diagnosis not present

## 2022-02-25 DIAGNOSIS — I739 Peripheral vascular disease, unspecified: Secondary | ICD-10-CM | POA: Diagnosis not present

## 2022-02-25 DIAGNOSIS — I10 Essential (primary) hypertension: Secondary | ICD-10-CM | POA: Diagnosis not present

## 2022-02-25 MED ORDER — OXYCODONE HCL 5 MG PO TABS
10.0000 mg | ORAL_TABLET | Freq: Four times a day (QID) | ORAL | Status: DC | PRN
  Administered 2022-02-25 – 2022-02-26 (×3): 10 mg via ORAL
  Filled 2022-02-25 (×3): qty 2

## 2022-02-25 MED ORDER — FENTANYL CITRATE PF 50 MCG/ML IJ SOSY
12.5000 ug | PREFILLED_SYRINGE | Freq: Once | INTRAMUSCULAR | Status: AC
  Administered 2022-02-25: 12.5 ug via INTRAVENOUS
  Filled 2022-02-25: qty 1

## 2022-02-25 MED ORDER — OXYCODONE HCL 5 MG PO TABS: 5.0000 mg | ORAL_TABLET | Freq: Four times a day (QID) | ORAL | Status: DC | PRN

## 2022-02-25 NOTE — Progress Notes (Signed)
Progress Note   Patient: Lauramae Kneisley [id number]:[id number] DOB: [birth year] DOA: 11/01/2021     116 DOS: the patient was seen and examined on 02/25/2022   Brief hospital course: 51 year old F ESRD on HD TTS,  recent lengthy hospitalization for Bacteroides Thetaiotaomicron bacteremia, hepatic and splenic abscess --required amputation of third digit of left hand and underwent left AKA and had splenic drain as well as CT-guided drainage of liver abscess she had a line holiday on last admission on long-term IV antibiotics until 09/08/4032 diastolic CHF, PVD s/p bilateral AKA, IDDM-2, morbid obesity and  chronic sacral wound presenting with progressive shortness of breath in the setting of missed dialysis and admitted for hypertensive emergency  Per patient, she never missed her dialysis however according to outpatient HD records, it takes at least 4 staff members and [facility] lift for patient to be seated in the dialysis chair.    Symptoms improved with hemodialysis.  Patient underwent R UE venogram on 8/30.  Vascular surgery following for possible RUE HD access.    Patient is now waiting placement where she can get HD on stretcher that has been difficult to find.  Assessment and Plan: End stage renal disease [facility]) Patient will continue renal replacement therapy per nephrology recommendations.  Needs placement to continue with HD as outpatient.  Very deconditioned, large sacral decubitus wound.   Continue renal replacement therapy per nephrology recommendations.   Anemia of chronic kidney disease, Hgb has been stable.  Continue with EPO and Iron supplementation.   Metabolic bone disease, continue with sevelamer.   Hyperkalemia, hypomagnesemia, hypokalemia, hyponatremia, hypophosphatemia have been corrected with renal replacement therapy.   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 Empiric treatment with thiosulfate 11/18. Discontinued due to persistent nausea and vomiting.  Patient complains of pain at  the subcutaneous nodules  Will try to limit opioid analgesics, will give one dose of fentanyl and continue pain control with oxycodone every 6 hrs as needed.   Hypertension Patient on midodrine on HD to prevent hypotension. Currently off antihypertensive medications.   Decubitus ulcer of sacral region, stage 3 (HCC) Deep wound at the sacrum, patient not able to seat.   Continue with oral oxycodone. Continue with local wound care.    Type 2 diabetes mellitus with hyperlipidemia (HCC) Her glucose has been stable and insulin therapy has been discontinued.   Statin therapy with atorvastatin.   Bacteremia Patient has completed antibiotic therapy. Bacteroides, splenic abscess.   PVD (peripheral vascular disease) (HCC) Bilateral AKA.   Continue local wound care.   Now patient has an electric chair   Class 3 obesity (HCC) Calculated BMI is 42.6  Acute on chronic diastolic CHF (congestive heart failure) (HCC) Volume regulation with ultrafiltration in hemodialysis Continue midodrine for blood pressure support.   Nodule of skin of abdomen Noted to have areas of painful nodularity in her right flank as well as anterior left lower pannus.  These areas are mildly warm, but tender to palpation.  Tenderness has been present for 2 weeks, but she feels it is getting worse.  She has been afebrile and wbc count normal. CT abdomen did not show any underlying skin abscess. Discussed with Nephrology, Dr. 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 Recommended Gen surgery see for biopsy. I reached out to Saverio Danker, [place] with Gen Surgery, no biopsy recommended  Continue supportive medical care with oral analgesics and control of electrolytes through hemodialysis.         Subjective: Patient with no chest pain or dyspnea, she has  abdominal pain at the subcutaneous nodules, has improved with analgesics, no further nausea or vomiting   Physical Exam: Vitals:   02/25/22 0720  02/25/22 0850 02/25/22 1550 02/25/22 1552  BP: 112/80 (!) 106/56 (!) 124/40 (!) 129/50  Pulse: 75 68 66 70  Resp: 20 18 18    Temp: 98.6 F (37 C) 98.5 F (36.9 C) 98.5 F (36.9 C)   TempSrc: Oral Oral Oral   SpO2: 100% 100% 100% 99%  Weight:      Height:       Neurology at the time of my examination somnolent but easy to arouse ENT with mild pallor Cardiovascular with S1 and S2 present and rhythmic Respiratory with no wheezing Abdomen with no distention  Bilateral AKA  Data Reviewed:    Family Communication: no family a the bedside   Disposition: Status is: Inpatient Remains inpatient appropriate because: pending placement   Planned Discharge Destination:  to be determined     Author: Tawni Millers, MD 02/25/2022 5:36 PM  For on call review [email or web address].

## 2022-02-25 NOTE — TOC Progression Note (Signed)
Transition of Care [facility]) - Initial/Assessment Note    Patient Details  Name: Courtney Gray MRN: [id number] Date of Birth: 12-08-1970  Transition of Care [facility]) CM/SW Contact:    Milinda Antis, [place] Phone Number: 02/25/2022, 2:53 PM  Clinical Narrative:                 LCSW met with patient at bedside to discuss looking out of state for placement in the event that the wheelchair that she is working with during PT is not an option.  The patient is adamant that she is not willing to be placed in a facility out of state.    TOC will continue to follow.    Expected Discharge Plan: Long Term Nursing Home Barriers to Discharge: No SNF bed, Transportation, Waiting for outpatient dialysis   Patient Goals and CMS Choice Patient states their goals for this hospitalization and ongoing recovery are:: To be at a facility close to her daughter      Expected Discharge Plan and Services Expected Discharge Plan: [place]       Living arrangements for the past 2 months: Single Family Home                                      Prior Living Arrangements/Services Living arrangements for the past 2 months: Single Family Home Lives with:: Adult Children Patient language and need for interpreter reviewed:: Yes        Need for Family Participation in Patient Care: Yes (Comment) Care giver support system in place?: No (comment)   Criminal Activity/Legal Involvement Pertinent to Current Situation/Hospitalization: No - Comment as needed  Activities of Daily Living Home Assistive Devices/Equipment: [employer] fast streamer ADL Screening (condition at time of admission) Patient's cognitive ability adequate to safely complete daily activities?: No Is the patient deaf or have difficulty hearing?: No Does the patient have difficulty seeing, even when wearing glasses/contacts?: No Does the patient have difficulty concentrating, remembering, or making decisions?: No Patient able to express  need for assistance with ADLs?: Yes Does the patient have difficulty dressing or bathing?: Yes Independently performs ADLs?: No Does the patient have difficulty walking or climbing stairs?: Yes Weakness of Legs: Both Weakness of Arms/Hands: Both  Permission Sought/Granted Permission sought to share information with : Other (comment) Permission granted to share information with : Yes, Verbal Permission Granted  Share Information with NAME: Ghrist,antoinette (Daughter)   [phone]  Permission granted to share info w AGENCY: SNF        Emotional Assessment Appearance:: Appears older than stated age Attitude/Demeanor/Rapport: Engaged Affect (typically observed): Tearful/Crying, Accepting Orientation: : Oriented to Situation, Oriented to  Time, Oriented to Place, Oriented to Self Alcohol / Substance Use: Not Applicable Psych Involvement: No (comment)  Admission diagnosis:  End stage renal disease ([place]) [N18.6] Fluid overload [E87.70] Obesity with serious comorbidity, unspecified classification, unspecified obesity type [E66.9] Pressure injury of right buttock, stage 2 ([place]) [L89.312] Patient Active Problem List   Diagnosis Date Noted   Nodule of skin of abdomen 02/16/2022   Acute on chronic systolic CHF (congestive heart failure) ([place]) 02/11/2022   Acute on chronic diastolic CHF (congestive heart failure) ([place]) 12/12/2021   PAD (peripheral artery disease) ([place]) 12/12/2021   Irritable bowel syndrome (IBS) 12/12/2021   Amputation of left middle finger 12/12/2021   GERD (gastroesophageal reflux disease) 12/12/2021   Hypertension    Type 2 diabetes  mellitus with hyperlipidemia (HCC)    Decubitus ulcer of sacral region, stage 3 (HCC)    Class 3 obesity (HCC)    Bacteremia    PVD (peripheral vascular disease) (HCC)    Hepatic abscess    Pressure injury of skin 09/13/2021   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[place])    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   Lactic acidosis  09/10/2021   Mixed diabetic hyperlipidemia associated with type 2 diabetes mellitus ([place]) 09/10/2021   Coronary artery disease involving native coronary artery of native heart without angina pectoris 09/10/2021   Elevated troponin level not due myocardial infarction 09/10/2021   Hypotension 09/10/2021   Dry gangrene (HCC)    Pain 08/03/2021   Ischemic foot pain at rest 08/03/2021   Hypoglycemia 07/10/2021   Volume overload 07/09/2021   Leukocytosis 06/11/2021   Acute gout 06/11/2021   Hyperkalemia 05/21/2021   Hemorrhoids    Hyperlipidemia 05/18/2021   Rectal bleeding 05/17/2021   Morbid obesity with BMI of 60.0-69.9, adult ([place]) 05/17/2021   NSTEMI (non-ST elevated myocardial infarction) ([place]) 05/16/2021   Essential hypertension 09/01/2020   End stage renal disease ([place]) 09/01/2020   Chronic respiratory failure with hypoxia ([place]) 09/01/2020   Hypertensive emergency    Hypertensive urgency 11/28/2019   Uncontrolled type 2 diabetes mellitus with hypoglycemia, with long-term current use of insulin ([place]) 11/28/2019   Anemia due to chronic kidney disease 11/28/2019   PCP:  Monico Blitz, MD Pharmacy:   [place], Eugenio Saenz [address] [place] [address] Phone: [phone] Fax: [phone]  [facility] [address] [place] [address] Phone: [phone] Fax: [phone]  [place], [place] - [address] [place] [address] Phone: [phone] Fax: [phone]     Social Determinants of Health ([place]) Interventions    Readmission Risk Interventions    09/16/2021    4:36 PM 08/28/2021   11:50 AM 07/12/2021   12:38 PM  Readmission Risk Prevention Plan  Transportation Screening Complete Complete Complete  Medication Review (RN Care Manager)  Complete Complete  PCP or Specialist appointment within 3-5 days of discharge  Complete Complete  HRI or  Home Care Consult Complete Complete Complete  SW Recovery Care/Counseling Consult Complete Complete Complete  Palliative Care Screening  Not Applicable Not Applicable  Skilled Nursing Facility Complete Not Applicable Complete

## 2022-02-25 NOTE — Progress Notes (Signed)
Physical Therapy Treatment Patient Details Name: Courtney Gray MRN: [id number] DOB: 1970/11/08 Today's Date: 02/25/2022   History of Present Illness Pt adm 7/28 with acute pulmonary edema; large sacral ulcer limiting sitting tolerance which makes SNF placement difficult for HD. 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    PT Comments    Continuing work on functional mobility and activity tolerance;  session focused on transfers and initial session/education re: use of demo power wc; After scooting bed to wc with heavy mod assist of 2, and optimally adjusting air in Roho cushion; Pt shown tilt in space and reclining features; stressed the need for pressure relief for wound healing, and pt able to verbalize understanding; Corene Cornea, BS, [employer] adjusted headrest, armrests for optimal fit, then he continued DME training/drive training after this PT ended the session;   Posted signs in the room re: timing of optimal pressure relief, to scoot to L only (to keep pressure and shearing to a minimum on the R), and posted signs re: optimal use of the maxisky amputee sling;   During the transfer OOB to the wc, pt asked about facing the wc and doing a pivot-turn in a certain way; Opted to go with pt's suggestion, but afterward with further consideration, I believe the path and turn were likely equal to scooting to the Right, and made the transfer more complicated and put too much on her Right ischial wound; Will discuss this with pt next session;    Recommendations for follow up therapy are one component of a multi-disciplinary discharge planning process, led by the attending physician.  Recommendations may be updated based on patient status, additional functional criteria and insurance authorization.  Follow Up Recommendations  Skilled nursing-short term rehab (<3 hours/day) Can patient physically be transported by private  vehicle: No   Assistance Recommended at Discharge Intermittent Supervision/Assistance  Patient can return home with the following Two people to help with walking and/or transfers;Two people to help with bathing/dressing/bathroom;Assist for transportation;Assistance with cooking/housework;Help with stairs or ramp for entrance   Equipment Recommendations  Wheelchair (measurements PT);Wheelchair cushion (measurements PT);Other (comment);Hospital bed (more accomodating mechanical lift, with sling that handles pt's with bil AKAs; Roho cushion (20x24), power wheelchair with power tilt and recline)    Recommendations for Other Services       Precautions / Restrictions Precautions Precautions: Fall Precaution Comments: bil AKA, 2L O2 at baseline Restrictions RUE Weight Bearing: Weight bearing as tolerated LUE Weight Bearing: Weight bearing as tolerated     Mobility  Bed Mobility Overal bed mobility: Modified Independent             General bed mobility comments: pulls to long sit with bedrails    Transfers Overall transfer level: Needs assistance Equipment used: None Transfers: Bed to chair/wheelchair/BSC            Lateral/Scoot Transfers: Mod assist, +2 physical assistance General transfer comment: Pt opted to initiate lateral scooting facing the wc, moving forward and then making a turn; Opted to go with pt's idea, but ultimately it may have essentially been scooting towards the Right, which we are trying to avoid    Ambulation/Gait                   Theme park manager mobility: Yes Wheelchair propulsion:  (Power) Wheelchair parts: Needs  assistance Distance:  (initiated rolling on the unit with pt and Stalls Rep, then PT bowed out and pt continued practicing with Stalls Rep) Wheelchair Assistance Details (indicate cue type and reason): Stalls Rep present to teach pt about power tilt and recline, and  propulsion  Modified Rankin (Stroke Patients Only)       Balance     Sitting balance-[name] Scale: Good                                      Cognition Arousal/Alertness: Awake/alert Behavior During Therapy: WFL for tasks assessed/performed Overall Cognitive Status: Within Functional Limits for tasks assessed                                          Exercises      General Comments General comments (skin integrity, edema, etc.): Once in power chair, deflated Roho cusion for pt to optimally "float" in cushion; Pt and this PT took the opportunity to ask questions, and pt voiced concern re: being able to pay for the specialty power chair and cushion      Pertinent Vitals/Pain Pain Assessment Pain Assessment: Faces Faces Pain Scale: Hurts little more Pain Location: Abdominal discomfort/nodule on the Right abdomen Pain Descriptors / Indicators: Grimacing, Discomfort Pain Intervention(s): Monitored during session, Other (comment) (Stalls Rep adjusted height of R armrest up a bit to have less pressin gthrough the nocule)    Home Living                          Prior Function            PT Goals (current goals can now be found in the care plan section) Acute Rehab PT Goals Patient Stated Goal: to get OOB; likes people and wants to be able to get out and about more PT Goal Formulation: With patient Time For Goal Achievement: 02/20/22 Potential to Achieve Goals: Good Progress towards PT goals: Progressing toward goals (Plan to review goals next session)    Frequency    Min 2X/week      PT Plan Current plan remains appropriate    Co-evaluation              AM-PAC PT "6 Clicks" Mobility   Outcome Measure  Help needed turning from your back to your side while in a flat bed without using bedrails?: None Help needed moving from lying on your back to sitting on the side of a flat bed without using bedrails?: None Help  needed moving to and from a bed to a chair (including a wheelchair)?: Total Help needed standing up from a chair using your arms (e.g., wheelchair or bedside chair)?: Total Help needed to walk in hospital room?: Total Help needed climbing 3-5 steps with a railing? : Total 6 Click Score: 12    End of Session Equipment Utilized During Treatment: Oxygen (Stalls rep roled O2 alongside pt as they rolled) Activity Tolerance: Patient tolerated treatment well (though pain from abdominal nodule limited time in power chair today) Patient left: Other (comment) (in wheelchair, rolling with Stalls Rep) Nurse Communication: Mobility status;Patient requests pain meds PT Visit Diagnosis: Other abnormalities of gait and mobility (R26.89);Muscle weakness (generalized) (M62.81) Pain - Right/Left: Right Pain - part of body: Hip;Leg  Time: 0900-1000 PT Time Calculation (min) (ACUTE ONLY): 60 min  Charges:  $Therapeutic Activity: 23-37 mins $Wheel Chair Management: 23-37 mins                     Roney Marion, PT  Acute Rehabilitation Services Office [phone]    [place] 02/25/2022, 2:54 PM

## 2022-02-25 NOTE — Progress Notes (Signed)
Physical Therapy Treatment Patient Details Name: Courtney Gray MRN: [id number] DOB: [birth year] Today's Date: 02/25/2022   History of Present Illness Pt adm 7/28 with acute pulmonary edema; large sacral ulcer limiting sitting tolerance which makes SNF placement difficult for HD. 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    PT Comments    Continuing work on functional mobility and activity tolerance;  session focused on assisting pt back to bed; Pt quite painful from a nodule R abdomen, and this added the element of pressure and difficulty to the transfer back to bed; Once the L hip guide was removed from the wc (it lifts right off when the locking mechanism is turned downward), initiated lateral scooting with 2 person assist; small, inefficient scoots at first, and in the interest of efficiency we opted for a third person's help; Still, in waiting for assist, pt was able ot think through how she wanted to move, and with that, she was able to do a more functional weight shift, making the scoot transfer (with 3 assist) very quick all the way to bed in one effort;   It is worth highlighting that the pain that limited her tolerance in the chair was NOT the pain at her R ischial wound; the specialty cushion has performed well today;   Overall, pt seems pleased with the power chair; Will need to coordinate better with pain meds moving forward; If we can, I think she will benefit from getting up to the power chair in the morning (perhaps for breakfast), and in the afternoon/evening as we ramp up her time OOB in the power chair.   Recommendations for follow up therapy are one component of a multi-disciplinary discharge planning process, led by the attending physician.  Recommendations may be updated based on patient status, additional functional criteria and insurance authorization.  Follow Up Recommendations  Skilled  nursing-short term rehab (<3 hours/day) Can patient physically be transported by private vehicle: No   Assistance Recommended at Discharge Intermittent Supervision/Assistance  Patient can return home with the following Two people to help with walking and/or transfers;Two people to help with bathing/dressing/bathroom;Assist for transportation;Assistance with cooking/housework;Help with stairs or ramp for entrance   Equipment Recommendations  Wheelchair (measurements PT);Wheelchair cushion (measurements PT);Other (comment);Hospital bed (more accomodating mechanical lift, with sling that handles pt's with bil AKAs; Roho cushion (20x24), power wheelchair with power tilt and recline)    Recommendations for Other Services       Precautions / Restrictions Precautions Precautions: Fall Precaution Comments: bil AKA, 2L O2 at baseline Restrictions RUE Weight Bearing: Weight bearing as tolerated LUE Weight Bearing: Weight bearing as tolerated     Mobility  Bed Mobility Overal bed mobility: Modified Independent             General bed mobility comments: pulls to long sit with bedrails    Transfers Overall transfer level: Needs assistance Equipment used:  (bed pad under pt) Transfers: Bed to chair/wheelchair/BSC            Lateral/Scoot Transfers: Mod assist, +2 physical assistance (and third person for efficiency of slide) General transfer comment: R abdominal nodule giving pt a considerable amount of pain, and she seemed quite anxious to get back in teh bed; Removed L hip guide attachment, raised bed to even with seat height, and used bed pad to help her scoot wc to bed on her L    Ambulation/Gait  Stairs             Information systems manager mobility: Yes Wheelchair propulsion:  (Power) Wheelchair parts: Needs Education officer, environmental:  (initiated rolling on the unit with pt and Stalls Rep, then PT bowed out and pt  continued practicing with [place]) Wheelchair Assistance Details (indicate cue type and reason): Stalls Rep present to teach pt about power tilt and recline, and propulsion  Modified Rankin (Stroke Patients Only)       Balance     Sitting balance-[name] Scale: Good                                      Cognition Arousal/Alertness: Awake/alert Behavior During Therapy: WFL for tasks assessed/performed Overall Cognitive Status: Within Functional Limits for tasks assessed                                          Exercises      General Comments General comments (skin integrity, edema, etc.): Pt's sister Ivin Booty present for session and helpful; Ivin Booty indicated the goal of being able to attend church would be meaningful for [facility]      Pertinent Vitals/Pain Pain Assessment Pain Assessment: Faces Faces Pain Scale: Hurts even more Pain Location: Abdominal discomfort/nodule on the Right abdomen Pain Descriptors / Indicators: Grimacing, Discomfort Pain Intervention(s): Repositioned    Home Living                          Prior Function            PT Goals (current goals can now be found in the care plan section) Acute Rehab PT Goals Patient Stated Goal: Wanting back to bed quickly because of pain PT Goal Formulation: With patient Time For Goal Achievement: 03/11/22 Potential to Achieve Goals: Good Additional Goals Additional Goal #1: Pt will verbalize and demonstrate how to perform pressure relief in power wc -- floor staff will report that she performs pressure relief without the need for prompting Progress towards PT goals: Goals met and updated - see care plan    Frequency    Min 2X/week      PT Plan Current plan remains appropriate    Co-evaluation              AM-PAC PT "6 Clicks" Mobility   Outcome Measure  Help needed turning from your back to your side while in a flat bed without using bedrails?:  None Help needed moving from lying on your back to sitting on the side of a flat bed without using bedrails?: None Help needed moving to and from a bed to a chair (including a wheelchair)?: Total Help needed standing up from a chair using your arms (e.g., wheelchair or bedside chair)?: Total Help needed to walk in hospital room?: Total Help needed climbing 3-5 steps with a railing? : Total 6 Click Score: 12    End of Session Equipment Utilized During Treatment: Oxygen Activity Tolerance: Patient tolerated treatment well Patient left: in bed;with call bell/phone within reach;with family/visitor present;with nursing/sitter in room Nurse Communication: Mobility status PT Visit Diagnosis: Other abnormalities of gait and mobility (R26.89);Muscle weakness (generalized) (M62.81) Pain - Right/Left: Right Pain - part of body: Hip;Leg (ischial tuberosity; R abdominal nodular pain)  Time: 9144-4584 PT Time Calculation (min) (ACUTE ONLY): 26 min  Charges:  $Therapeutic Activity: 23-37 mins $Wheel Chair Management: 23-37 mins                     Roney Marion, PT  Acute Rehabilitation Services Office [phone]    Colletta [place] 02/25/2022, 5:18 PM

## 2022-02-25 NOTE — Progress Notes (Signed)
Courtney Gray Progress Note   Subjective:  Seen in room - PT getting ready to work with her with power chair - she is excited. Had pain overnight related to the abdominal nodules. NaThiosulfate was stopped yesterday - she felt was causing vomiting. No CP/dyspnea. HD went fine yesterday - net UF 1.5L.  Objective Vitals:   02/24/22 2049 02/25/22 0543 02/25/22 0720 02/25/22 0850  BP: 107/76 (!) 139/52 112/80 (!) 106/56  Pulse: 67 73 75 68  Resp: _0 Temp: 98 F (36.7 C) 99 F (37.2 C) 98.6 F (37 C) 98.5 F (36.9 C)  TempSrc: Oral Oral Oral Oral  SpO2: 100% 100% 100% 100%  Weight: 116.1 kg     Height:       Physical Exam General: Well appearing, overweight woman, NAD. Room air at this time. Heart: RRR; no murmur Lungs: CTA anteriorly Abdomen: soft; several firm "nodules" with overlying bruising Extremities: B AKA; no flank/hip edema Dialysis Access: [facility]  Additional Objective Labs: Basic Metabolic Panel: Recent Labs  Lab 02/20/22 1554 02/22/22 2050  NA 139 137  K 4.0 4.3  CL 104 99  CO2 24 27  GLUCOSE 120* 133*  BUN 32* 32*  CREATININE 8.52* 8.20*  CALCIUM 9.0 9.3  PHOS 5.2* 5.8*   Liver Function Tests: Recent Labs  Lab 02/20/22 1554 02/22/22 2050  ALBUMIN 2.5* 2.6*   CBC: Recent Labs  Lab 02/20/22 1554 02/22/22 2049  WBC 9.2 11.4*  HGB 7.6* 8.1*  HCT 25.7* 27.0*  MCV 100.4* 98.5  PLT 248 269   Medications:   aspirin EC  81 mg Oral Daily   atorvastatin  10 mg Oral Daily   Chlorhexidine Gluconate Cloth  6 each Topical Q0600   darbepoetin (ARANESP) injection - DIALYSIS  200 mcg Subcutaneous Q Tue-1800   docusate sodium  100 mg Oral BID   famotidine  20 mg Oral Daily   feeding supplement  1 Container Oral TID BM   gabapentin  100 mg Oral Q1200   gabapentin  200 mg Oral QHS   lactulose  20 g Oral Daily   levothyroxine  100 mcg Oral Q0600   loratadine  10 mg Oral Daily   melatonin  3 mg Oral QHS   midodrine  10 mg Oral Q  M,W,F-HD   pantoprazole  40 mg Oral Daily   sevelamer carbonate  4.8 g Oral TID WC    Dialysis Orders: No outpatient unit at this time.   Assessment/Plan: Inability to obtain HD at OP center D/T body habitus and wound. Difficult situation with no clear answers yet. PT working with pt -  met with seating specialist to get evaluated for custom chair/cushion that will allow her sit for longer periods.  Appreciate heroic efforts from PT team.  ESRD: Usual TTS schedule - following Thanksgiving Holiday schedule this week (Mon/Wed/Sat) - next HD tomorrow (11/22). New TDC placed 02/04/2022.   Painful nodular lesions in abdomen: Initially felt just d/t subq heparin injections, but not resolving with time and also becoming more painful. 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 Surgery will not be able to do biopsy any time soon.Started empirically on sodium thiosulfate, but caused vomiting so stopped. Not on any calcium based products or vitamin D.  HypoTN/volume- On midodrine 74m qHD, with 120mprn mid HD w/parameters to keep SBP >100 d/t becoming symptomatic when it drops lower.  Significant weight loss during admission.  Remains euvolemic on exam.  Severe ischial sacral decubitus  wound: Limiting her, noted to be slow healing. Goal to tolerate sitting upright - which she is doing with PT. COPD history of hypoxia on [place] O2 at home (was on 5 L at baseline has been able to taper to 2-3 L [place]).  Anemia of ESRD - Hgb 8.1, on max dose Aranesp 270mg weekly. Recent Fe load completed.  Secondary hyperparathyroidism: Phos borderline - continue Renvela 4.8g AC TID. CorrCa elevated. Not on VDRA, last PTH 60 on 01/30/22.  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 Nutrition - diet continues to be changed from renal.  Currently on regular diet.  Should stay on renal diet.  Disposition: Difficult situation. Prior to admit, patient required 6 staff members and 2 hoyer pads for transfer  into HD chair as OP. Could not stay on HD for full tx. 2/2 pain of multiple decubitus ulcers and PTAR unable to lift pt into ambulance for transport.SW working on LTC at [employer] Will need to tolerate 5-6hrs in a chair to tolerate OP dialysis including transport, treatment and cannulation time.  The main issue is her Rt ischial wound. PT has arranged seating specialist with plan for custom power chair/cushion that would keep pressure off wounds and allow her to sit for longer periods.  KVeneta Penton PA-C 02/25/2022, 10:18 AM  [employer]

## 2022-02-26 DIAGNOSIS — I5023 Acute on chronic systolic (congestive) heart failure: Secondary | ICD-10-CM | POA: Diagnosis not present

## 2022-02-26 MED ORDER — OXYCODONE HCL 5 MG PO TABS
10.0000 mg | ORAL_TABLET | ORAL | Status: DC | PRN
  Administered 2022-02-26 – 2022-03-14 (×47): 10 mg via ORAL
  Filled 2022-02-26 (×50): qty 2

## 2022-02-26 MED ORDER — ALTEPLASE 2 MG IJ SOLR
2.0000 mg | Freq: Once | INTRAMUSCULAR | Status: AC
  Administered 2022-02-26: 2 mg
  Filled 2022-02-26: qty 2

## 2022-02-26 MED ORDER — HEPARIN SODIUM (PORCINE) 1000 UNIT/ML IJ SOLN
INTRAMUSCULAR | Status: AC
  Administered 2022-02-26: 1000 [IU]
  Filled 2022-02-26: qty 5

## 2022-02-26 MED ORDER — HYDROMORPHONE HCL 1 MG/ML IJ SOLN
1.0000 mg | INTRAMUSCULAR | Status: AC | PRN
  Administered 2022-02-27 – 2022-02-28 (×3): 1 mg via INTRAVENOUS
  Filled 2022-02-26 (×3): qty 1

## 2022-02-26 NOTE — Progress Notes (Incomplete)
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 -Admitted, symptoms improved after hemodialysis -Now awaiting placement to dialysis center where she can get treatment on a stretcher, LTAC denied   Subjective: -HD catheter malfunction yesterday, getting new tunneled catheter today -Continues to have some discomfort at the site of subcu nodules from heparin injections -BM yesterday  Assessment and Plan:  Volume overload End stage renal disease ([place]) -See discussion above,  -Has not been successful in chair dialysis, limited by deep decubitus wound -TOC team following, to assist with placement and dialysis center where she can dialyze on a stretcher -Complicated by large sacral decubitus wound and bilateral AKA  Anemia of chronic kidney disease, Hgb has been stable.  Continue with EPO and Iron supplementation.   Hyperkalemia, hypomagnesemia, hypokalemia, hyponatremia, hypophosphatemia -corrected with renal replacement therapy.   Hypertension -On midodrine with HD Currently off antihypertensive medications.   Decubitus ulcer of sacral region, stage 3 (HCC)  Deep wound at the sacrum, patient not able to sit    History of IBS -Intermittent constipation, resolved after lactulose -Diarrhea yesterday, hold further laxatives today  Type 2  diabetes mellitus with hyperlipidemia ([place]) -Stable  Bacteremia Patient has completed antibiotic therapy. Bacteroides, splenic abscess.   PVD (peripheral vascular disease) (HCC) Bilateral AKA.   COPD -Stable, on home O2 2 L  Class 3 obesity (HCC) Calculated BMI is 65,3   Acute on chronic diastolic CHF (congestive heart failure) (HCC) Volume regulation with ultrafiltration in hemodialysis Continue midodrine for blood pressure support.   DVT prophylaxis: Heparin subcutaneous Code Status: Full code Family Communication: No family at bedside Disposition Plan: To be determined  Consultants: Nephrology   Procedures:   Antimicrobials:    Objective: Vitals:   02/25/22 2046 02/26/22 0603 02/26/22 0830 02/26/22 1200  BP: (!) 124/51 (!) 109/95 (!) 96/30 (!) 111/34  Pulse: 74 72 81 70  Resp: 18 18 16    Temp: 98.2 F (36.8 C) 98.3 F (36.8 C) 98.5 F (36.9 C)   TempSrc: Oral Oral Oral   SpO2: 99% 100% 100%   Weight:      Height:        Intake/Output Summary (Last 24 hours) at 02/26/2022 1338 Last data filed at 02/26/2022 0800 Gross per 24 hour  Intake 940 ml  Output --  Net 940 ml   Filed Weights   02/23/22 0052 02/23/22 2133 02/24/22 2049  Weight: 112.7 kg 112.7 kg 116.1 kg    Examination:  General exam: Obese chronically ill female sitting up in bed, AAOx3, no distress HEENT: No JVD CVS: S1-S2, regular rhythm Lungs: Clear bilaterally Abdomen: Soft, obese, large abdominal pannus with nodular areas, tender Extremities: Bilateral AKA Skin: Right deep sacral decubitus wound with minimal serous drainage  Psychiatry:  Mood & affect appropriate.     Data Reviewed:   CBC: Recent Labs  Lab 02/20/22 1554 02/22/22 2049  WBC 9.2 11.4*  HGB 7.6*  8.1*  HCT 25.7* 27.0*  MCV 100.4* 98.5  PLT 248 349   Basic Metabolic Panel: Recent Labs  Lab 02/20/22 1554 02/22/22 2050  NA 139 137  K 4.0 4.3  CL 104 99  CO2 24 27  GLUCOSE 120* 133*  BUN 32* 32*   CREATININE 8.52* 8.20*  CALCIUM 9.0 9.3  PHOS 5.2* 5.8*   GFR: Estimated Creatinine Clearance: 10.3 mL/min (A) (by C-G formula based on SCr of 8.2 mg/dL (H)). Liver Function Tests: Recent Labs  Lab 02/20/22 1554 02/22/22 2050  ALBUMIN 2.5* 2.6*   No results for input(s): "LIPASE", "AMYLASE" in the last 168 hours. No results for input(s): "AMMONIA" in the last 168 hours. Coagulation Profile: No results for input(s): "INR", "PROTIME" in the last 168 hours. Cardiac Enzymes: No results for input(s): "CKTOTAL", "CKMB", "CKMBINDEX", "TROPONINI" in the last 168 hours. BNP (last 3 results) No results for input(s): "PROBNP" in the last 8760 hours. HbA1C: No results for input(s): "HGBA1C" in the last 72 hours. CBG: No results for input(s): "GLUCAP" in the last 168 hours.  Lipid Profile: No results for input(s): "CHOL", "HDL", "LDLCALC", "TRIG", "CHOLHDL", "LDLDIRECT" in the last 72 hours. Thyroid Function Tests: No results for input(s): "TSH", "T4TOTAL", "FREET4", "T3FREE", "THYROIDAB" in the last 72 hours. Anemia Panel: No results for input(s): "VITAMINB12", "FOLATE", "FERRITIN", "TIBC", "IRON", "RETICCTPCT" in the last 72 hours. Urine analysis: No results found for: "COLORURINE", "APPEARANCEUR", "LABSPEC", "PHURINE", "GLUCOSEU", "HGBUR", "BILIRUBINUR", "KETONESUR", "PROTEINUR", "UROBILINOGEN", "NITRITE", "LEUKOCYTESUR" Sepsis Labs: @LABRCNTIP (procalcitonin:4,lacticidven:4)  )No results found for this or any previous visit (from the past 240 hour(s)).   Radiology Studies: No results found.   Scheduled Meds:  aspirin EC  81 mg Oral Daily   atorvastatin  10 mg Oral Daily   Chlorhexidine Gluconate Cloth  6 each Topical Q0600   darbepoetin (ARANESP) injection - DIALYSIS  200 mcg Subcutaneous Q Tue-1800   docusate sodium  100 mg Oral BID   famotidine  20 mg Oral Daily   feeding supplement  1 Container Oral TID BM   gabapentin  100 mg Oral Q1200   gabapentin  200 mg Oral QHS    lactulose  20 g Oral Daily   levothyroxine  100 mcg Oral Q0600   loratadine  10 mg Oral Daily   melatonin  3 mg Oral QHS   midodrine  10 mg Oral Q M,W,F-HD   pantoprazole  40 mg Oral Daily   sevelamer carbonate  4.8 g Oral TID WC   Continuous Infusions:     LOS: 117 days    Time spent: 17min    Domenic Polite, MD [facility]   02/26/2022, 1:38 PM

## 2022-02-26 NOTE — Progress Notes (Signed)
Alteplase administered @ 1720 and is to dwell in the catheter for 1 hour.

## 2022-02-26 NOTE — Consult Note (Signed)
[place] for Infectious Disease   Reason for Consult:  Ischial ulcer  Referring Physician: Broadus John  Principal Problem:   Acute on chronic systolic CHF (congestive heart failure) ([place]) Active Problems:   Anemia due to chronic kidney disease   Hypertensive emergency   End stage renal disease (HCC)   Chronic respiratory failure with hypoxia (HCC)   Leukocytosis   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HCC)   Pressure injury of skin   Hypertension   Type 2 diabetes mellitus with hyperlipidemia (HCC)   Decubitus ulcer of sacral region, stage 3 (HCC)   Class 3 obesity (HCC)   Bacteremia   PVD (peripheral vascular disease) (HCC)   Acute on chronic diastolic CHF (congestive heart failure) (HCC)   PAD (peripheral artery disease) (HCC)   Irritable bowel syndrome (IBS)   Amputation of left middle finger   GERD (gastroesophageal reflux disease)   Nodule of skin of abdomen    HPI: Courtney Gray is a 51 y.o. female - with ESRD on HD, T2DM, PAD/PVD, Anemia of kidney disease, hx of bilateral AKA, hx of hepatic/splenic abscess, hx of bacteroides. 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 She reports that her right ischial wound is improving in depth and size. She denies fever, chills, nightsweats. She states that she has been on abtx occasionally during this hospitalization where she has outbreaks to her face, and lose of appetite. She denies any buttock pain  Past Medical History:  Diagnosis Date   Arthritis    Diabetes mellitus without complication ([place])    Dialysis complication, subsequent encounter    Gout    High cholesterol    Hypertension    Renal disorder     Allergies:  Allergies  Allergen Reactions   Benzonatate Other (See Comments)    Hallucinations   Hydralazine Other (See Comments)    Hallucinations   Amoxicillin Itching and Nausea And Vomiting   Clonidine Other (See Comments)     Altered mental state, insomnia    Chlorhexidine Itching    Patient reports it is the CHG baths that cause her itching not the CHG used for caring for her IV/HD access.     Morphine Itching   Oxycodone Itching    Pt tolerated hospital admission 07/2021     MEDICATIONS:  alteplase  2 mg Intracatheter Once   aspirin EC  81 mg Oral Daily   atorvastatin  10 mg Oral Daily   Chlorhexidine Gluconate Cloth  6 each Topical Q0600   darbepoetin (ARANESP) injection - DIALYSIS  200 mcg Subcutaneous Q Tue-1800   docusate sodium  100 mg Oral BID   famotidine  20 mg Oral Daily   feeding supplement  1 Container Oral TID BM   gabapentin  100 mg Oral Q1200   gabapentin  200 mg Oral QHS   heparin sodium (porcine)       lactulose  20 g Oral Daily   levothyroxine  100 mcg Oral Q0600   loratadine  10 mg Oral Daily   melatonin  3 mg Oral QHS   midodrine  10 mg Oral Q M,W,F-HD   pantoprazole  40 mg Oral Daily   sevelamer carbonate  4.8 g Oral TID WC    Social History   Tobacco Use   Smoking status: Never   Smokeless tobacco: Never  Vaping Use   Vaping Use: Never used  Substance Use Topics   Alcohol use: Never  Drug use: Never    Family History  Problem Relation Age of Onset   Diabetes Mother    Diabetes Father     Review of Systems  Constitutional: Negative for fever, chills, diaphoresis, activity change, appetite change, fatigue and unexpected weight change.  HENT: Negative for congestion, sore throat, rhinorrhea, sneezing, trouble swallowing and sinus pressure.  Eyes: Negative for photophobia and visual disturbance.  Respiratory: Negative for cough, chest tightness, shortness of breath, wheezing and stridor.  Cardiovascular: Negative for chest pain, palpitations and leg swelling.  Gastrointestinal: Negative for nausea, vomiting, abdominal pain, diarrhea, constipation, blood in stool, abdominal distention and anal bleeding.  Genitourinary: Negative for dysuria, hematuria, flank pain and  difficulty urinating.  Musculoskeletal: Negative for myalgias, back pain, joint swelling, arthralgias and gait problem.  Skin: right abdominal panus pain at nodular siteNegative for color change, pallor, rash and wound.  Neurological: Negative for dizziness, tremors, weakness and light-headedness.  Hematological: Negative for adenopathy. Does not bruise/bleed easily.  Psychiatric/Behavioral: Negative for behavioral problems, confusion, sleep disturbance, dysphoric mood, decreased concentration and agitation.     OBJECTIVE: Temp:  [98.2 F (36.8 C)-98.5 F (36.9 C)] 98.3 F (36.8 C) (11/22 1533) Pulse Rate:  [66-81] 66 (11/22 1630) Resp:  [11-19] 11 (11/22 1630) BP: (96-135)/(30-95) 113/59 (11/22 1630) SpO2:  [99 %-100 %] 100 % (11/22 1630) Physical Exam  Constitutional:  oriented to person, place, and time. appears well-developed and well-nourished. No distress.  HENT: /AT, PERRLA, no scleral icterus Mouth/Throat: Oropharynx is clear and moist. No oropharyngeal exudate.  Cardiovascular: Normal rate, regular rhythm and normal heart sounds. Exam reveals no gallop and no friction rub.  No murmur heard.  Pulmonary/Chest: Effort normal and breath sounds normal. No respiratory distress.  has no wheezes.  Neck = supple, no nuchal rigidity Abdominal: Soft. Bowel sounds are normal.  exhibits no distension. There is no tenderness.  Lymphadenopathy: no cervical adenopathy. No axillary adenopathy Neurological: alert and oriented to person, place, and time.  Skin: Skin is warm and dry. No rash noted. No erythema.  Psychiatric: a normal mood and affect.  behavior is normal.    LABS: Lab Results  Component Value Date   WBC 11.4 (H) 02/22/2022   HGB 8.1 (L) 02/22/2022   HCT 27.0 (L) 02/22/2022   MCV 98.5 02/22/2022   PLT 269 02/22/2022     MICRO: Reviewed. Not colonized with MRSA IMAGING:  IMPRESSION: 1. No acute intra-abdominal pathology identified. No definite radiographic  explanation for the patient's reported symptoms. 2. Extensive multi-vessel coronary artery calcification. 3. Calcification of the aortic valve leaflets. Echocardiography may be helpful to assess the degree of valvular dysfunction. 4. Small left pleural effusion, decreased in size since prior examination. 5. Marked bilateral renal atrophy in keeping with changes of end-stage renal disease 6. Progressive right gluteal decubitus ulcer with ulcer cavity now extending to the right ischium which demonstrates erosion in keeping with changes of osteomyelitis. 7. Extensive aortoiliac and visceral arteriosclerosis. Aortic Atherosclerosis (ICD10-I70.0).  Assessment/Plan:  51yo F with ESRD on HD and also PAD/PVD s/p bilateral AKA.with right ischial pressure ulcer. Imaging found to have signs concern for osteomyelitis associated with ischial ulcer.  - recommend to get cbc, sed rate, and crp - continue with local wound care to right gluteal decubitus ulcer. It would be helpful to see recent measurements to see if improving is size and depth. 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 - if can convince her  to take abtx, can consider treating with orals with amox/clav x 4 wk to see if improvement. And tolerability of patient. - will check back on Monday to see how she tolerates it.  Elzie Rings [facility] Infectious [facility] [phone]

## 2022-02-26 NOTE — Procedures (Signed)
I was present at this dialysis session. I have reviewed the session itself and made appropriate changes.   3K bath using TDC.  Stable.  Has pain in her nodules. 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  Focus on Ca and P control.  Did not tolerate NaThiosulfate.  Last PTH was 60.   Filed Weights   02/23/22 0052 02/23/22 2133 02/24/22 2049  Weight: 112.7 kg 112.7 kg 116.1 kg    Recent Labs  Lab 02/22/22 2050  NA 137  K 4.3  CL 99  CO2 27  GLUCOSE 133*  BUN 32*  CREATININE 8.20*  CALCIUM 9.3  PHOS 5.8*    Recent Labs  Lab 02/20/22 1554 02/22/22 2049  WBC 9.2 11.4*  HGB 7.6* 8.1*  HCT 25.7* 27.0*  MCV 100.4* 98.5  PLT 248 269    Scheduled Meds:  heparin sodium (porcine)       aspirin EC  81 mg Oral Daily   atorvastatin  10 mg Oral Daily   Chlorhexidine Gluconate Cloth  6 each Topical Q0600   darbepoetin (ARANESP) injection - DIALYSIS  200 mcg Subcutaneous Q Tue-1800   docusate sodium  100 mg Oral BID   famotidine  20 mg Oral Daily   feeding supplement  1 Container Oral TID BM   gabapentin  100 mg Oral Q1200   gabapentin  200 mg Oral QHS   lactulose  20 g Oral Daily   levothyroxine  100 mcg Oral Q0600   loratadine  10 mg Oral Daily   melatonin  3 mg Oral QHS   midodrine  10 mg Oral Q M,W,F-HD   pantoprazole  40 mg Oral Daily   sevelamer carbonate  4.8 g Oral TID WC   Continuous Infusions: PRN Meds:.heparin sodium (porcine), acetaminophen, mouth rinse, oxyCODONE, polyethylene glycol, simethicone   Pearson Grippe  MD 02/26/2022, 3:51 PM

## 2022-02-26 NOTE — Progress Notes (Signed)
Patient experienced catheter issues. Catheter clotted off. Called MD Joelyn Oms. Alteplase was ordered. Alteplase will sit in the catheter for 1 hour and patient will be brought up back to dialysis.

## 2022-02-26 NOTE — Progress Notes (Signed)
[facility] Progress Note   Subjective:  Seen in room - used power chair yesterday which she was happy about, had pain with it through. For HD later today.  Objective Vitals:   02/25/22 1552 02/25/22 2046 02/26/22 0603 02/26/22 0830  BP: (!) 129/50 (!) 124/51 (!) 109/95 (!) 96/30  Pulse: 70 74 72 81  Resp:  _0 Temp:  98.2 F (36.8 C) 98.3 F (36.8 C) 98.5 F (36.9 C)  TempSrc:  Oral Oral Oral  SpO2: 99% 99% 100% 100%  Weight:      Height:       Physical Exam General: Well appearing, overweight woman, NAD. Room air at this time. Heart: RRR; no murmur Lungs: CTA anteriorly Abdomen: soft; several firm "nodules" with overlying bruising Extremities: B AKA; no flank/hip edema Dialysis Access: [facility]  Additional Objective Labs: Basic Metabolic Panel: Recent Labs  Lab 02/20/22 1554 02/22/22 2050  NA 139 137  K 4.0 4.3  CL 104 99  CO2 24 27  GLUCOSE 120* 133*  BUN 32* 32*  CREATININE 8.52* 8.20*  CALCIUM 9.0 9.3  PHOS 5.2* 5.8*   Liver Function Tests: Recent Labs  Lab 02/20/22 1554 02/22/22 2050  ALBUMIN 2.5* 2.6*   CBC: Recent Labs  Lab 02/20/22 1554 02/22/22 2049  WBC 9.2 11.4*  HGB 7.6* 8.1*  HCT 25.7* 27.0*  MCV 100.4* 98.5  PLT 248 269   Medications:   aspirin EC  81 mg Oral Daily   atorvastatin  10 mg Oral Daily   Chlorhexidine Gluconate Cloth  6 each Topical Q0600   darbepoetin (ARANESP) injection - DIALYSIS  200 mcg Subcutaneous Q Tue-1800   docusate sodium  100 mg Oral BID   famotidine  20 mg Oral Daily   feeding supplement  1 Container Oral TID BM   gabapentin  100 mg Oral Q1200   gabapentin  200 mg Oral QHS   lactulose  20 g Oral Daily   levothyroxine  100 mcg Oral Q0600   loratadine  10 mg Oral Daily   melatonin  3 mg Oral QHS   midodrine  10 mg Oral Q M,W,F-HD   pantoprazole  40 mg Oral Daily   sevelamer carbonate  4.8 g Oral TID WC    Dialysis Orders: No outpatient unit at this time.    Assessment/Plan: Inability to obtain HD at OP center D/T body habitus and wound. Difficult situation with no clear answers yet. PT working with pt -  met with seating specialist to get evaluated for custom chair/cushion that will allow her sit for longer periods.  Appreciate heroic efforts from PT team.  ESRD: Usual TTS schedule - following Thanksgiving Holiday schedule this week (Mon/Wed/Sat) - next HD today (11/22). New TDC placed 02/04/2022.   Painful nodular lesions in abdomen: Initially felt just d/t subq heparin injections, but not resolving with time and also becoming more painful. 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 Surgery will not be able to do biopsy any time soon. Started empirically on sodium thiosulfate, but caused vomiting so stopped. Not on any calcium based products or vitamin D.  HypoTN/volume- On midodrine 69m qHD, with 113mprn mid HD w/parameters to keep SBP >100 d/t becoming symptomatic when it drops lower.  Significant weight loss during admission.  Remains euvolemic on exam.  Severe ischial sacral decubitus wound: Limiting her, noted to be slow healing. Goal to tolerate sitting upright - which she is doing with PT. COPD history of hypoxia on  [place] O2 at home (was on 5 L at baseline has been able to taper to 2-3 L [place]).  Anemia of ESRD - Hgb 8.1, on max dose Aranesp 274mg weekly. Recent Fe load completed.  Secondary hyperparathyroidism: Phos borderline - continue Renvela 4.8g AC TID. CorrCa elevated. Not on VDRA, last PTH 60 on 01/30/22.  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 Nutrition - diet continues to be changed from renal.  Currently on regular diet.  Should stay on renal diet.  Disposition: Difficult situation. Prior to admit, patient required 6 staff members and 2 hoyer pads for transfer into HD chair as OP. Could not stay on HD for full tx. 2/2 pain of multiple decubitus ulcers and PTAR unable to lift pt into ambulance for  transport.SW working on LTC at [employer] Will need to tolerate 5-6hrs in a chair to tolerate OP dialysis including transport, treatment and cannulation time.  The main issue is her Rt ischial wound. PT has arranged seating specialist with plan for custom power chair/cushion that would keep pressure off wounds and allow her to sit for longer periods.  KVeneta Penton PA-C 02/26/2022, 11:23 AM  [employer]

## 2022-02-26 NOTE — Progress Notes (Signed)
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   Subjective: -Continues to have pain at the site of her abdominal nodules, getting oxycodone every 4 hours -About to go for dialysis today, denies pain at her sacrum, ischial wound site  Assessment and Plan:  Volume overload End stage renal disease ([place]) -See discussion above,  -Has not been successful in chair dialysis, limited by deep decubitus wound -TOC team following, to assist with placement and dialysis center where she can dialyze on a stretcher -Complicated by large sacral decubitus wound and bilateral AKA  Tender nodules, in abdominal fat -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  Continue with EPO and Iron supplementation.   Hyperkalemia, hypomagnesemia, hypokalemia, hyponatremia, hypophosphatemia -corrected with renal replacement therapy.   Hypertension -On midodrine with HD Currently off antihypertensive medications.   History of IBS -Intermittent constipation, resolved after lactulose -Diarrhea yesterday, hold further laxatives today  Type 2 diabetes mellitus with hyperlipidemia ([place]) -Stable  Bacteremia Patient has completed antibiotic therapy. Bacteroides, splenic abscess.   PVD (peripheral vascular disease) (HCC) Bilateral AKA.   COPD -Stable, on home O2 2 L  Class 3 obesity (HCC) Calculated BMI is 65,3   Acute on chronic diastolic CHF (congestive heart failure) (HCC) Volume regulation with ultrafiltration in hemodialysis Continue midodrine for blood pressure support.   DVT prophylaxis: Heparin subcutaneous Code Status: Full code Family Communication: No family at bedside Disposition Plan: To be determined  Consultants: Nephrology   Procedures:   Antimicrobials:    Objective: Vitals:   02/25/22 2046 02/26/22 0603 02/26/22 0830 02/26/22 1200  BP: (!) 124/51 (!) 109/95 (!) 96/30 (!) 111/34  Pulse: 74 72 81 70  Resp: _0 Temp: 98.2 F (36.8 C) 98.3 F (36.8 C) 98.5 F (36.9 C)   TempSrc: Oral Oral Oral   SpO2: 99% 100% 100%   Weight:      Height:        Intake/Output Summary (Last 24 hours) at 02/26/2022 1347 Last data filed at 02/26/2022 0800 Gross per 24 hour  Intake 940 ml  Output --  Net 940 ml   Filed Weights   02/23/22 0052 02/23/22 2133 02/24/22 2049  Weight: 112.7 kg 112.7 kg 116.1  kg    Examination:  General exam: Obese chronically ill female sitting up in bed, AAOx3, no distress CVS: S1-S2, regular  rhythm Lungs: Clear bilaterally Abdomen: Soft, obese, large abdominal pannus with multiple tender nodular areas Extremities: Bilateral AKA Skin: Deep sacral decubitus wound over right ischium with minimal serous drainage Hello Paige Psychiatry:  Mood & affect appropriate.     Data Reviewed:   CBC: Recent Labs  Lab 02/20/22 1554 02/22/22 2049  WBC 9.2 11.4*  HGB 7.6* 8.1*  HCT 25.7* 27.0*  MCV 100.4* 98.5  PLT 248 628   Basic Metabolic Panel: Recent Labs  Lab 02/20/22 1554 02/22/22 2050  NA 139 137  K 4.0 4.3  CL 104 99  CO2 24 27  GLUCOSE 120* 133*  BUN 32* 32*  CREATININE 8.52* 8.20*  CALCIUM 9.0 9.3  PHOS 5.2* 5.8*   GFR: Estimated Creatinine Clearance: 10.3 mL/min (A) (by C-G formula based on SCr of 8.2 mg/dL (H)). Liver Function Tests: Recent Labs  Lab 02/20/22 1554 02/22/22 2050  ALBUMIN 2.5* 2.6*   No results for input(s): "LIPASE", "AMYLASE" in the last 168 hours. No results for input(s): "AMMONIA" in the last 168 hours. Coagulation Profile: No results for input(s): "INR", "PROTIME" in the last 168 hours. Cardiac Enzymes: No results for input(s): "CKTOTAL", "CKMB", "CKMBINDEX", "TROPONINI" in the last 168 hours. BNP (last 3 results) No results for input(s): "PROBNP" in the last 8760 hours. HbA1C: No results for input(s): "HGBA1C" in the last 72 hours. CBG: No results for input(s): "GLUCAP" in the last 168 hours.  Lipid Profile: No results for input(s): "CHOL", "HDL", "LDLCALC", "TRIG", "CHOLHDL", "LDLDIRECT" in the last 72 hours. Thyroid Function Tests: No results for input(s): "TSH", "T4TOTAL", "FREET4", "T3FREE", "THYROIDAB" in the last 72 hours. Anemia Panel: No results for input(s): "VITAMINB12", "FOLATE", "FERRITIN", "TIBC", "IRON", "RETICCTPCT" in the last 72 hours. Urine analysis: No results found for: "COLORURINE", "APPEARANCEUR", "LABSPEC", "PHURINE", "GLUCOSEU", "HGBUR", "BILIRUBINUR", "KETONESUR", "PROTEINUR", "UROBILINOGEN",  "NITRITE", "LEUKOCYTESUR" Sepsis Labs: _0 (procalcitonin:4,lacticidven:4)  )No results found for this or any previous visit (from the past 240 hour(s)).   Radiology Studies: No results found.   Scheduled Meds:  aspirin EC  81 mg Oral Daily   atorvastatin  10 mg Oral Daily   Chlorhexidine Gluconate Cloth  6 each Topical Q0600   darbepoetin (ARANESP) injection - DIALYSIS  200 mcg Subcutaneous Q Tue-1800   docusate sodium  100 mg Oral BID   famotidine  20 mg Oral Daily   feeding supplement  1 Container Oral TID BM   gabapentin  100 mg Oral Q1200   gabapentin  200 mg Oral QHS   lactulose  20 g Oral Daily   levothyroxine  100 mcg Oral Q0600   loratadine  10 mg Oral Daily   melatonin  3 mg Oral QHS   midodrine  10 mg Oral Q M,W,F-HD   pantoprazole  40 mg Oral Daily   sevelamer carbonate  4.8 g Oral TID WC   Continuous Infusions:     LOS: 117 days    Time spent: 81mn    PDomenic Polite MD [facility]   02/26/2022, 1:47 PM

## 2022-02-27 LAB — CBC
HCT: 32.2 % — ABNORMAL LOW (ref 36.0–46.0)
Hemoglobin: 9.5 g/dL — ABNORMAL LOW (ref 12.0–15.0)
MCH: 29.1 pg (ref 26.0–34.0)
MCHC: 29.5 g/dL — ABNORMAL LOW (ref 30.0–36.0)
MCV: 98.5 fL (ref 80.0–100.0)
Platelets: 279 10*3/uL (ref 150–400)
RBC: 3.27 MIL/uL — ABNORMAL LOW (ref 3.87–5.11)
RDW: 14.6 % (ref 11.5–15.5)
WBC: 12.2 10*3/uL — ABNORMAL HIGH (ref 4.0–10.5)
nRBC: 0 % (ref 0.0–0.2)

## 2022-02-27 LAB — COMPREHENSIVE METABOLIC PANEL
ALT: 11 U/L (ref 0–44)
AST: 17 U/L (ref 15–41)
Albumin: 3 g/dL — ABNORMAL LOW (ref 3.5–5.0)
Alkaline Phosphatase: 66 U/L (ref 38–126)
Anion gap: 17 — ABNORMAL HIGH (ref 5–15)
BUN: 19 mg/dL (ref 6–20)
CO2: 26 mmol/L (ref 22–32)
Calcium: 9.5 mg/dL (ref 8.9–10.3)
Chloride: 94 mmol/L — ABNORMAL LOW (ref 98–111)
Creatinine, Ser: 5.68 mg/dL — ABNORMAL HIGH (ref 0.44–1.00)
GFR, Estimated: 8 mL/min — ABNORMAL LOW (ref >60)
Glucose, Bld: 121 mg/dL — ABNORMAL HIGH (ref 70–99)
Potassium: 3.6 mmol/L (ref 3.5–5.1)
Sodium: 137 mmol/L (ref 135–145)
Total Bilirubin: 0.3 mg/dL (ref 0.3–1.2)
Total Protein: 8.3 g/dL — ABNORMAL HIGH (ref 6.5–8.1)

## 2022-02-27 LAB — SEDIMENTATION RATE: Sed Rate: 96 mm/hr — ABNORMAL HIGH (ref 0–22)

## 2022-02-27 LAB — C-REACTIVE PROTEIN: CRP: 13.8 mg/dL — ABNORMAL HIGH (ref <1.0)

## 2022-02-27 MED ORDER — BISACODYL 10 MG RE SUPP
10.0000 mg | Freq: Once | RECTAL | Status: AC
  Administered 2022-02-27: 10 mg via RECTAL
  Filled 2022-02-27: qty 1

## 2022-02-27 NOTE — Progress Notes (Signed)
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

## 2022-02-27 NOTE — Progress Notes (Signed)
   02/26/22 2345  Vitals  Temp 98.7 F (37.1 C)  Temp Source Oral  BP 123/68  BP Location Right Arm  BP Method Automatic  Patient Position (if appropriate) Lying  Pulse Rate 81  Pulse Rate Source Monitor  ECG Heart Rate 82  Resp 20  Post Treatment  Dialyzer Clearance Heavily streaked  Duration of HD Treatment -hour(s) 2.13 hour(s)  Liters Processed 43.7  Fluid Removed (mL) 1600 mL  Tolerated HD Treatment Yes  Post-Hemodialysis Comments clotted off the last 20 mins   TX fin w/ ones change of cartridge for TMP alarm of clotting.

## 2022-02-27 NOTE — Progress Notes (Signed)
[facility] Progress Note   Subjective:   Did not sleep well last night. No other new concerns. Noted filter did clot with HD yesterday. No SOB, CP or dizziness.   Objective Vitals:   02/26/22 2337 02/26/22 2345 02/27/22 0536 02/27/22 0919  BP: (!) 115/49 123/68 (!) 103/55 (!) 126/52  Pulse: 81 81 78 76  Resp: _0 Temp:  98.7 F (37.1 C) 98.3 F (36.8 C)   TempSrc:  Oral Oral   SpO2:  98% 100%   Weight:      Height:       Physical Exam General: Well appearing, overweight woman, NAD.  Heart: RRR; no murmur Lungs: CTA bilaterally, diminished breath sounds Abdomen: soft; several firm "nodules" with overlying bruising. +BS Extremities: B AKA; no flank/hip edema Dialysis Access: [facility]  Additional Objective Labs: Basic Metabolic Panel: Recent Labs  Lab 02/20/22 1554 02/22/22 2050 02/27/22 0422  NA 139 137 137  K 4.0 4.3 3.6  CL 104 99 94*  CO2 _1 GLUCOSE 120* 133* 121*  BUN 32* 32* 19  CREATININE 8.52* 8.20* 5.68*  CALCIUM 9.0 9.3 9.5  PHOS 5.2* 5.8*  --    Liver Function Tests: Recent Labs  Lab 02/20/22 1554 02/22/22 2050 02/27/22 0422  AST  --   --  17  ALT  --   --  11  ALKPHOS  --   --  66  BILITOT  --   --  0.3  PROT  --   --  8.3*  ALBUMIN 2.5* 2.6* 3.0*   No results for input(s): "LIPASE", "AMYLASE" in the last 168 hours. CBC: Recent Labs  Lab 02/20/22 1554 02/22/22 2049 02/27/22 0422  WBC 9.2 11.4* 12.2*  HGB 7.6* 8.1* 9.5*  HCT 25.7* 27.0* 32.2*  MCV 100.4* 98.5 98.5  PLT 248 269 279   Blood Culture    Component Value Date/Time   SDES BLOOD LEFT ANTECUBITAL 10/16/2021 1534   SPECREQUEST  10/16/2021 1534    BOTTLES DRAWN AEROBIC AND ANAEROBIC Blood Culture results may not be optimal due to an inadequate volume of blood received in culture bottles   CULT  10/16/2021 1534    NO GROWTH 5 DAYS Performed at [facility] [address] [place],  [address]    REPTSTATUS 10/21/2021 FINAL 10/16/2021  1534    Cardiac Enzymes: No results for input(s): "CKTOTAL", "CKMB", "CKMBINDEX", "TROPONINI" in the last 168 hours. CBG: No results for input(s): "GLUCAP" in the last 168 hours. Iron Studies: No results for input(s): "IRON", "TIBC", "TRANSFERRIN", "FERRITIN" in the last 72 hours. _2 @ Studies/Results: No results found. Medications:   aspirin EC  81 mg Oral Daily   atorvastatin  10 mg Oral Daily   Chlorhexidine Gluconate Cloth  6 each Topical Q0600   darbepoetin (ARANESP) injection - DIALYSIS  200 mcg Subcutaneous Q Tue-1800   docusate sodium  100 mg Oral BID   famotidine  20 mg Oral Daily   feeding supplement  1 Container Oral TID BM   gabapentin  100 mg Oral Q1200   gabapentin  200 mg Oral QHS   lactulose  20 g Oral Daily   levothyroxine  100 mcg Oral Q0600   loratadine  10 mg Oral Daily   melatonin  3 mg Oral QHS   midodrine  10 mg Oral Q M,W,F-HD   pantoprazole  40 mg Oral Daily   sevelamer carbonate  4.8 g Oral TID WC  Dialysis Orders: No outpatient unit at this time.   Assessment/Plan: Inability to obtain HD at OP center D/T body habitus and wound. Difficult situation with no clear answers yet. PT working with pt -  met with seating specialist to get evaluated for custom chair/cushion that will allow her sit for longer periods.  Appreciate heroic efforts from PT team.  ESRD: Usual TTS schedule - following Thanksgiving Holiday schedule this week (Mon/Wed/Sat) - next HD 11/24. New TDC placed 02/04/2022.  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 Surgery will not be able to do biopsy any time soon. Started empirically on sodium thiosulfate, but caused vomiting so stopped. Not on any calcium based products or vitamin D.  HypoTN/volume- On midodrine 13m qHD, with 129mprn mid HD w/parameters  to keep SBP >100 d/t becoming symptomatic when it drops lower.  Significant weight loss during admission.  Remains euvolemic on exam.  Severe ischial sacral decubitus wound: Limiting her, noted to be slow healing. Goal to tolerate sitting upright - which she is doing with PT. COPD history of hypoxia on [place] O2 at home (was on 5 L at baseline has been able to taper to 2-3 L [place]).  Anemia of ESRD - Hgb 8.1, on max dose Aranesp 2009mweekly. Recent Fe load completed.  Secondary hyperparathyroidism: Phos borderline - continue Renvela 4.8g AC TID. CorrCa elevated. Not on VDRA, last PTH 60 on 01/30/22.  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 Nutrition - diet continues to be changed from renal.  Currently on regular diet.  Should stay on renal diet.  Disposition: Difficult situation. Prior to admit, patient required 6 staff members and 2 hoyer pads for transfer into HD chair as OP. Could not stay on HD for full tx. 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  The main issue is her Rt ischial wound. PT has arranged seating specialist with plan for custom power chair/cushion that would keep pressure off wounds and allow her to sit for longer periods.  SamAnice PaganiniA-C 02/27/2022, 11:04 AM  [facility] Pager: [phone]

## 2022-02-28 MED ORDER — HEPARIN SODIUM (PORCINE) 1000 UNIT/ML DIALYSIS
6000.0000 [IU] | Freq: Once | INTRAMUSCULAR | Status: AC
  Administered 2022-03-01: 6000 [IU] via INTRAVENOUS_CENTRAL

## 2022-02-28 MED ORDER — LACTULOSE 10 GM/15ML PO SOLN
10.0000 g | Freq: Every day | ORAL | Status: DC
  Administered 2022-03-08 – 2022-03-09 (×2): 10 g via ORAL
  Filled 2022-02-28 (×7): qty 15

## 2022-02-28 MED ORDER — HYDROMORPHONE HCL 1 MG/ML IJ SOLN
0.5000 mg | INTRAMUSCULAR | Status: DC | PRN
  Administered 2022-02-28 – 2022-03-01 (×4): 0.5 mg via INTRAVENOUS
  Filled 2022-02-28 (×5): qty 1

## 2022-02-28 NOTE — Progress Notes (Signed)
 [facility] Progress Note   Subjective:   Pt seen in room, reports she took lactulose last night and was up all night with diarrhea, is very tired and bottom is sore. Wants to move HD to tomorrow which is fine. No SOB, CP or dizziness.   Objective Vitals:   02/27/22 1118 02/27/22 1627 02/27/22 2126 02/28/22 0653  BP: (!) 110/58 (!) 130/92 (!) 101/36 112/78  Pulse: 76 77 74 70  Resp: 18 18 18 18  Temp: 98.9 F (37.2 C) 98.8 F (37.1 C) 99 F (37.2 C) 98.4 F (36.9 C)  TempSrc: Oral Oral Oral Oral  SpO2: 100% 95% 99% 99%  Weight:      Height:       Physical Exam General: Well appearing, overweight woman, NAD.  Heart: RRR; no murmur Lungs: CTA bilaterally, diminished breath sounds Abdomen: soft; non-distended. +BS Extremities: B AKA; no flank/hip edema Dialysis Access: TDC  Additional Objective Labs: Basic Metabolic Panel: Recent Labs  Lab 02/22/22 2050 02/27/22 0422  NA 137 137  K 4.3 3.6  CL 99 94*  CO2 27 26  GLUCOSE 133* 121*  BUN 32* 19  CREATININE 8.20* 5.68*  CALCIUM 9.3 9.5  PHOS 5.8*  --    Liver Function Tests: Recent Labs  Lab 02/22/22 2050 02/27/22 0422  AST  --  17  ALT  --  11  ALKPHOS  --  66  BILITOT  --  0.3  PROT  --  8.3*  ALBUMIN 2.6* 3.0*   No results for input(s): "LIPASE", "AMYLASE" in the last 168 hours. CBC: Recent Labs  Lab 02/22/22 2049 02/27/22 0422  WBC 11.4* 12.2*  HGB 8.1* 9.5*  HCT 27.0* 32.2*  MCV 98.5 98.5  PLT 269 279   Blood Culture    Component Value Date/Time   SDES BLOOD LEFT ANTECUBITAL 10/16/2021 1534   SPECREQUEST  10/16/2021 1534    BOTTLES DRAWN AEROBIC AND ANAEROBIC Blood Culture results may not be optimal due to an inadequate volume of blood received in culture bottles   CULT  10/16/2021 1534    NO GROWTH 5 DAYS Performed at [facility], [address] [place], [place] [address]    REPTSTATUS 10/21/2021 FINAL 10/16/2021 1534    Cardiac Enzymes: No results for  input(s): "CKTOTAL", "CKMB", "CKMBINDEX", "TROPONINI" in the last 168 hours. CBG: No results for input(s): "GLUCAP" in the last 168 hours. Iron Studies: No results for input(s): "IRON", "TIBC", "TRANSFERRIN", "FERRITIN" in the last 72 hours. @lablastinr3@ Studies/Results: No results found. Medications:   aspirin EC  81 mg Oral Daily   atorvastatin  10 mg Oral Daily   Chlorhexidine Gluconate Cloth  6 each Topical Q0600   darbepoetin (ARANESP) injection - DIALYSIS  200 mcg Subcutaneous Q Tue-1800   docusate sodium  100 mg Oral BID   famotidine  20 mg Oral Daily   feeding supplement  1 Container Oral TID BM   gabapentin  100 mg Oral Q1200   gabapentin  200 mg Oral QHS   [START ON 03/01/2022] heparin  6,000 Units Dialysis Once in dialysis   lactulose  20 g Oral Daily   levothyroxine  100 mcg Oral Q0600   loratadine  10 mg Oral Daily   melatonin  3 mg Oral QHS   midodrine  10 mg Oral Q M,W,F-HD   pantoprazole  40 mg Oral Daily   sevelamer carbonate  4.8 g Oral TID WC    Dialysis Orders: No outpatient unit at   this time.    Assessment/Plan: Inability to obtain HD at OP center D/T body habitus and wound. Difficult situation with no clear answers yet. PT working with pt -  met with seating specialist to get evaluated for custom chair/cushion that will allow her sit for longer periods.  Appreciate heroic efforts from PT team.  ESRD: Usual TTS schedule - Was on MWF schedule this week due to holiday, not feeling very well today, will do HD tomorrow instead. New TDC placed 02/04/2022.  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 Surgery will not be able to do biopsy any time soon. Started empirically on sodium thiosulfate, but caused vomiting so stopped. Not on any calcium based products or vitamin D.  HypoTN/volume- On  midodrine 10mg qHD, with 10mg prn mid HD w/parameters to keep SBP >100 d/t becoming symptomatic when it drops lower.  Significant weight loss during admission.  Remains euvolemic on exam.  Severe ischial sacral decubitus wound: Limiting her, noted to be slow healing. Goal to tolerate sitting upright - which she is doing with PT. COPD history of hypoxia on [place] O2 at home (was on 5 L at baseline has been able to taper to 2-3 L [place]).  Anemia of ESRD - Hgb 9.5, on max dose Aranesp 200mcg weekly. Recent Fe load completed.  Secondary hyperparathyroidism: Phos borderline - continue Renvela 4.8g AC TID. CorrCa elevated. Not on VDRA, last PTH 60 on 01/30/22.  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 Nutrition - diet continues to be changed from renal.  Currently on regular diet.  Should stay on renal diet.  Disposition: Difficult situation. Prior to admit, patient required 6 staff members and 2 hoyer pads for transfer into HD chair as OP. Could not stay on HD for full tx. 2/2 pain of multiple decubitus ulcers and PTAR unable to lift pt into ambulance for transport.SW working on LTC at SNF. Will need to tolerate 5-6hrs in a chair to tolerate OP dialysis including transport, treatment and cannulation time.  The main issue is her Rt ischial wound. PT has arranged seating specialist with plan for custom power chair/cushion that would keep pressure off wounds and allow her to sit for longer periods.      , PA-C 02/28/2022, 9:03 AM  [facility] Pager: [phone]   

## 2022-02-28 NOTE — Progress Notes (Signed)
Mobility Specialist Progress Note:   02/28/22 1500  Mobility  Activity Transferred from chair to bed (power chair to bed)  Level of Assistance +2 (takes two people)  Civil engineer, contracting / Overhead Lift  Activity Response Tolerated fair  Mobility Referral No  $Mobility charge 1 Mobility   Pt transferred back to bed via maxisky. C/o significant pain from sore on R side. Back in bed with all needs met.   Nelta Numbers Mobility Specialist Please contact via SecureChat or  Rehab office at [phone]

## 2022-02-28 NOTE — Progress Notes (Signed)
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    PT Comments    Continuing to work on functional mobility, positioning, and activity tolerance; session focused on spending more time in the demo power chair, with particular emphasis on performing pressure relief; cued pt to set a timer for 30 minutes immediately upon getting into the chair, and continued reinforcement of the need to perform pressure relief, that is, tilt in space back, to get pressure off of her R ischial wound for 10 minutes, and do this every 30 minutes while she is out of bed; pt needed reminders and repetition of rationale to tilt in space backwards for pressure relief; posted a sign in her room as well;  The painful R abdominal nodule has opened and become a sore; this nodule/sore is causing pt to move into an antalgic, left and forward leaning position -- This is limiting pt's tolerance of time in the power chair (the Right armrest makes contact with that sore spot); So far, pressure and pain from the right ischial wound are not the limiting factors to tolerance of the power chair;   Pt was quite tired today (rough night and morning with diarrhea); Still, she was able to tolerate the [facility] lift to the power chair (and we were able to demonstrate how to use the amputee pad for two nurses, and they can show more floor staff as well); we discussed that if she is in the power chair and/or HD recliner, she must do pressure relief every half hour-- if she is so tired that she must nap, at this time it is  recommended that she get back to bed;   Progressing safe mobility and activity tolerance will require a well-coordinated and multidisciplinary effort among Nursing, PT, Mobility Specialists, transporters, and all involved in Takeesha's care; It will also require equipment that meets her needs.     Recommendations for follow up therapy are one component of a multi-disciplinary discharge planning process, led by the attending physician.  Recommendations may be updated based on patient status, additional functional criteria and insurance authorization.  Follow Up Recommendations  Skilled nursing-short term rehab (<3 hours/day) Can patient physically be transported by private vehicle: No   Assistance Recommended at Discharge Intermittent Supervision/Assistance  Patient can return home with the following Two people to help with walking and/or transfers;Two people to help with bathing/dressing/bathroom;Assist for transportation;Assistance with cooking/housework;Help with stairs or ramp for entrance   Equipment Recommendations  Wheelchair (measurements PT);Wheelchair cushion (measurements PT);Other (comment);Hospital bed (more accomodating mechanical lift, with sling that handles pt's with bil AKAs; Roho cushion (20x24), power wheelchair with power tilt and recline)    Recommendations for Other Services       Precautions / Restrictions Precautions Precautions: Fall Precaution Comments: bil AKA, 2L O2 at baseline; when in poer chair, MUST perform pressure relief every 30 minutes by tilting back for 10 minutes Restrictions RUE Weight Bearing: Weight bearing as tolerated LUE Weight Bearing: Weight bearing as tolerated Other Position/Activity Restrictions: when in poer chair, MUST perform pressure relief every 30 minutes by tilting back for 10 minutes  Mobility  Bed Mobility Overal bed mobility: Modified Independent, Needs Assistance             General bed mobility comments: Pt  leaning forward to near prone position upon entry (except hips flexed, and lower limbs tucked under trunk in what looks like prone position); this position seemed comfrotable and did look like it unweighs the R ischial wound; used bed tilt feature to help with getting back to upright and then to reclined position in bed for pad placement    Transfers Overall transfer level: Needs assistance Equipment used: Ambulation equipment used Transfers: Bed to chair/wheelchair/BSC             General transfer comment: Used Maxisky with amputee pad for bed to power chair transfer; RN present to learn how to properly setup [employer] amputeee pad Transfer via Lift Equipment: [employer]  Ambulation/Gait                   Theme park manager mobility: Yes Wheelchair parts: Consulting civil engineer Details (indicate cue type and reason): focused on using tilt feature to perform pressure relief; as pt's R ischial wound began to cause pain, she tended to lean forward and to the L, and was hesitant to lean back into chair (due to anticiapteion of pain) to initiate tilt feature and truly get pressure off of teh ischial wound; lots of encouragement/cueing, and when pt did perform the tilt, she reported less pain from the ischial cound  Modified Rankin (Stroke Patients Only)       Balance     Sitting balance-[name] Scale: Good                                      Cognition Arousal/Alertness: Awake/alert Behavior During Therapy: WFL for tasks assessed/performed Overall Cognitive Status: Within Functional Limits for tasks assessed (for simple tasks)                                 General Comments: Concern for pt remembering to perform pressure relief and following through with pressure relief        Exercises      General Comments General comments (skin integrity, edema, etc.): Took the  opportunity to show 2 nurses how to use the amputee pad for the [facility]; they will continue to spread the word on how to use teh [facility] amputee pad      Pertinent Vitals/Pain Pain Assessment Pain Assessment: Faces Faces Pain Scale: Hurts even more Pain Location: Abdominal discomfort/nodule/sore on the Right abdomen Pain Descriptors / Indicators: Grimacing, Discomfort Pain Intervention(s): Monitored during session, Repositioned (antalgic lean to L)    Home Living                          Prior Function            PT Goals (current goals can now be found in the care plan section) Acute Rehab PT Goals Patient Stated Goal: Getting time in [facility], getting out and about PT Goal Formulation: With patient Time For Goal Achievement: 03/11/22 Potential to Achieve Goals: Good Progress towards PT goals: Progressing toward goals (slowly)    Frequency    Min 2X/week  PT Plan Current plan remains appropriate    Co-evaluation              AM-PAC PT "6 Clicks" Mobility   Outcome Measure  Help needed turning from your back to your side while in a flat bed without using bedrails?: None Help needed moving from lying on your back to sitting on the side of a flat bed without using bedrails?: None Help needed moving to and from a bed to a chair (including a wheelchair)?: Total Help needed standing up from a chair using your arms (e.g., wheelchair or bedside chair)?: Total Help needed to walk in hospital room?: Total Help needed climbing 3-5 steps with a railing? : Total 6 Click Score: 12    End of Session Equipment Utilized During Treatment: Oxygen Activity Tolerance: Other (comment) (showing overall good pressure relief for R ischial wound with demo cushion and power chair; abdominal nodule, now with open sore area is painful near the R armrest of teh power chair) Patient left: in chair;Other (comment) (in power chair with Mobility Specialist and RN aobut to assist her  back to bed) Nurse Communication: Mobility status PT Visit Diagnosis: Other abnormalities of gait and mobility (R26.89);Muscle weakness (generalized) (M62.81) Pain - Right/Left: Right Pain - part of body: Hip;Leg (ischial tuberosity; R abdominal nodular pain)     Time: 5366-4403 PT Time Calculation (min) (ACUTE ONLY): 63 min  Charges:  $Therapeutic Activity: 23-37 mins $Self Care/Home Management: 8-22 $Wheel Chair Management: 8-22 mins                     Roney Marion, PT  Acute Rehabilitation Services Office [phone]    Colletta [place] 02/28/2022, 5:47 PM

## 2022-03-01 LAB — CBC
HCT: 25.7 % — ABNORMAL LOW (ref 36.0–46.0)
Hemoglobin: 7.8 g/dL — ABNORMAL LOW (ref 12.0–15.0)
MCH: 29.4 pg (ref 26.0–34.0)
MCHC: 30.4 g/dL (ref 30.0–36.0)
MCV: 97 fL (ref 80.0–100.0)
Platelets: 261 10*3/uL (ref 150–400)
RBC: 2.65 MIL/uL — ABNORMAL LOW (ref 3.87–5.11)
RDW: 14.8 % (ref 11.5–15.5)
WBC: 14.3 10*3/uL — ABNORMAL HIGH (ref 4.0–10.5)
nRBC: 0 % (ref 0.0–0.2)

## 2022-03-01 LAB — RENAL FUNCTION PANEL
Albumin: 2.5 g/dL — ABNORMAL LOW (ref 3.5–5.0)
Anion gap: 19 — ABNORMAL HIGH (ref 5–15)
BUN: 46 mg/dL — ABNORMAL HIGH (ref 6–20)
CO2: 24 mmol/L (ref 22–32)
Calcium: 10 mg/dL (ref 8.9–10.3)
Chloride: 94 mmol/L — ABNORMAL LOW (ref 98–111)
Creatinine, Ser: 10.6 mg/dL — ABNORMAL HIGH (ref 0.44–1.00)
GFR, Estimated: 4 mL/min — ABNORMAL LOW (ref >60)
Glucose, Bld: 125 mg/dL — ABNORMAL HIGH (ref 70–99)
Phosphorus: 5.3 mg/dL — ABNORMAL HIGH (ref 2.5–4.6)
Potassium: 4.7 mmol/L (ref 3.5–5.1)
Sodium: 137 mmol/L (ref 135–145)

## 2022-03-01 LAB — GLUCOSE, CAPILLARY
Glucose-Capillary: 109 mg/dL — ABNORMAL HIGH (ref 70–99)
Glucose-Capillary: 87 mg/dL (ref 70–99)

## 2022-03-01 MED ORDER — HEPARIN SODIUM (PORCINE) 1000 UNIT/ML DIALYSIS: 1000.0000 [IU] | INTRAMUSCULAR | Status: DC | PRN

## 2022-03-01 MED ORDER — ANTICOAGULANT SODIUM CITRATE 4% (200MG/5ML) IV SOLN: 5.0000 mL | Status: DC | PRN

## 2022-03-01 MED ORDER — MIDODRINE HCL 5 MG PO TABS
10.0000 mg | ORAL_TABLET | Freq: Once | ORAL | Status: AC
  Administered 2022-03-01: 10 mg via ORAL

## 2022-03-01 MED ORDER — HYDROMORPHONE HCL 2 MG PO TABS
1.0000 mg | ORAL_TABLET | ORAL | Status: DC | PRN
  Administered 2022-03-01 – 2022-03-14 (×17): 1 mg via ORAL
  Filled 2022-03-01 (×19): qty 1

## 2022-03-01 MED ORDER — ALTEPLASE 2 MG IJ SOLR: 2.0000 mg | Freq: Once | INTRAMUSCULAR | Status: DC | PRN

## 2022-03-01 MED ORDER — HEPARIN SODIUM (PORCINE) 1000 UNIT/ML IJ SOLN
INTRAMUSCULAR | Status: AC
  Filled 2022-03-01: qty 6

## 2022-03-01 MED ORDER — HEPARIN SODIUM (PORCINE) 1000 UNIT/ML IJ SOLN
INTRAMUSCULAR | Status: AC
  Filled 2022-03-01: qty 5

## 2022-03-01 NOTE — Progress Notes (Signed)
Tx initiated via [facility] Aspirates and flushes well. Dressing managed per protocol. No signs and symptoms of infection.connected A-A V-V. BFR achieved.UF goal of 1L. Lines secured. Access visible.

## 2022-03-01 NOTE — Progress Notes (Signed)
Noted decrease in patient blood pressure, patient denies symptoms at this time other than pain in her side from a prior sore. Patient is resting at this time, uf is off due to the decrease in blood pressure. Will continue to monitor for changes in symptoms and blood pressure. RN is aware.

## 2022-03-01 NOTE — Progress Notes (Signed)
[facility] Progress Note   Subjective:   Seen in room, report R ischial wound is very sore. Denies abdominal pain, N/V/D. No SOB, CP or dizziness. Planned for HD today off schedule  Objective Vitals:   02/28/22 1700 02/28/22 2115 02/28/22 2117 03/01/22 0508  BP: 127/73 92/75 (!) 108/44 (!) 111/30  Pulse: 70 81 81 74  Resp: _0 Temp: 97.8 F (36.6 C) 98.2 F (36.8 C)  98.5 F (36.9 C)  TempSrc: Oral Oral  Oral  SpO2: 97% 94% 100% 100%  Weight:      Height:       Physical Exam General: overweight, alert, NAD.  Heart: RRR; no murmur Lungs: CTA bilaterally, diminished breath sounds Abdomen: soft; non-distended. +BS Extremities: B/L AKA; no flank/hip edema Dialysis Access: [facility]    Additional Objective Labs: Basic Metabolic Panel: Recent Labs  Lab 02/22/22 2050 02/27/22 0422  NA 137 137  K 4.3 3.6  CL 99 94*  CO2 27 26  GLUCOSE 133* 121*  BUN 32* 19  CREATININE 8.20* 5.68*  CALCIUM 9.3 9.5  PHOS 5.8*  --    Liver Function Tests: Recent Labs  Lab 02/22/22 2050 02/27/22 0422  AST  --  17  ALT  --  11  ALKPHOS  --  66  BILITOT  --  0.3  PROT  --  8.3*  ALBUMIN 2.6* 3.0*   No results for input(s): "LIPASE", "AMYLASE" in the last 168 hours. CBC: Recent Labs  Lab 02/22/22 2049 02/27/22 0422  WBC 11.4* 12.2*  HGB 8.1* 9.5*  HCT 27.0* 32.2*  MCV 98.5 98.5  PLT 269 279   Blood Culture    Component Value Date/Time   SDES BLOOD LEFT ANTECUBITAL 10/16/2021 1534   SPECREQUEST  10/16/2021 1534    BOTTLES DRAWN AEROBIC AND ANAEROBIC Blood Culture results may not be optimal due to an inadequate volume of blood received in culture bottles   CULT  10/16/2021 1534    NO GROWTH 5 DAYS Performed at [facility], [place] [address], [place], [place] [address]    REPTSTATUS 10/21/2021 FINAL 10/16/2021 1534    Cardiac Enzymes: No results for input(s): "CKTOTAL", "CKMB", "CKMBINDEX", "TROPONINI" in the last 168 hours. CBG: No results  for input(s): "GLUCAP" in the last 168 hours. Iron Studies: No results for input(s): "IRON", "TIBC", "TRANSFERRIN", "FERRITIN" in the last 72 hours. _1 @ Studies/Results: No results found. Medications:   aspirin EC  81 mg Oral Daily   atorvastatin  10 mg Oral Daily   Chlorhexidine Gluconate Cloth  6 each Topical Q0600   darbepoetin (ARANESP) injection - DIALYSIS  200 mcg Subcutaneous Q Tue-1800   docusate sodium  100 mg Oral BID   famotidine  20 mg Oral Daily   feeding supplement  1 Container Oral TID BM   gabapentin  100 mg Oral Q1200   gabapentin  200 mg Oral QHS   heparin  6,000 Units Dialysis Once in dialysis   lactulose  10 g Oral Daily   levothyroxine  100 mcg Oral Q0600   loratadine  10 mg Oral Daily   melatonin  3 mg Oral QHS   midodrine  10 mg Oral Q M,W,F-HD   pantoprazole  40 mg Oral Daily   sevelamer carbonate  4.8 g Oral TID WC    Dialysis Orders: No outpatient unit at this time.   Assessment/Plan: Inability to obtain HD at OP center D/T body habitus and wound. Difficult situation with no  clear answers yet. PT working with pt -  met with seating specialist to get evaluated for custom chair/cushion that will allow her sit for longer periods.  Appreciate efforts from PT team.  ESRD: Usual TTS schedule. New TDC placed 02/04/2022.  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 Surgery will not be able to do biopsy any time soon. Started empirically on sodium thiosulfate, but caused vomiting so stopped. Not on any calcium based products or vitamin D.  HypoTN/volume- On midodrine 71m qHD, with 161mprn mid HD w/parameters to keep SBP >100 d/t becoming symptomatic when it drops lower.  Significant weight loss during admission.  Remains euvolemic on exam.  Severe ischial sacral decubitus wound:  Limiting her, noted to be slow healing. Goal to tolerate sitting upright - which she is doing with PT. COPD history of hypoxia on [place] O2 at home (was on 5 L at baseline has been able to taper to 2-3 L [place]).  Anemia of ESRD - Hgb 9.5, on max dose Aranesp 20065mweekly. Recent Fe load completed.  Secondary hyperparathyroidism: Phos borderline - continue Renvela 4.8g AC TID. CorrCa elevated. Not on VDRA, last PTH 60 on 01/30/22.  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 Nutrition - diet continues to be changed from renal.  Currently on regular diet.  Should stay on renal diet.  Disposition: Difficult situation. Prior to admit, patient required 6 staff members and 2 hoyer pads for transfer into HD chair as OP. Could not stay on HD for full tx. 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  The main issue is her Rt ischial wound. PT has arranged seating specialist with plan for custom power chair/cushion that would keep pressure off wounds and allow her to sit for longer periods.     SamAnice PaganiniA-C 03/01/2022, 8:43 AM  [facility] Pager: [phone]

## 2022-03-01 NOTE — Progress Notes (Signed)
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

## 2022-03-01 NOTE — Progress Notes (Signed)
Received patient in bed to unit.  Alert and oriented.  Informed consent signed and in chart.    Patient tolerated well.  Transported back to the room  Alert, without acute distress.  Hand-off given to patient's nurse.   Access used: [facility] Access issues: none  Total UF removed: 1L Medication(s) given: none  1635: Patient reported feeling cold and sweaty. Blood glucose: 87. Snacks and grape juice given. Repeat blood glucose at 1703: 107mg /dl.   [facility]

## 2022-03-01 NOTE — Plan of Care (Signed)

## 2022-03-02 MED ORDER — GERHARDT'S BUTT CREAM
TOPICAL_CREAM | Freq: Two times a day (BID) | CUTANEOUS | Status: DC
  Administered 2022-03-03 – 2022-04-16 (×2): 1 via TOPICAL
  Filled 2022-03-02 (×4): qty 1

## 2022-03-02 MED ORDER — MIDODRINE HCL 5 MG PO TABS
10.0000 mg | ORAL_TABLET | ORAL | Status: DC
  Administered 2022-03-05 – 2022-05-09 (×24): 10 mg via ORAL
  Filled 2022-03-02 (×43): qty 2

## 2022-03-02 MED ORDER — ZINC OXIDE 40 % EX OINT: TOPICAL_OINTMENT | Freq: Two times a day (BID) | CUTANEOUS | Status: DC

## 2022-03-02 MED ORDER — MIDODRINE HCL 5 MG PO TABS: 10.0000 mg | ORAL_TABLET | ORAL | Status: DC

## 2022-03-02 NOTE — Progress Notes (Signed)
[facility] Progress Note   Subjective:   Pt's blood pressure was soft at the start of HD and UF was held. She later felt sweaty and BS was 86, juice provided. Pt upset that she was not able to eat lunch prior to HD and has other complaints about HD unit staff- plans to call unit manager tomorrow (already has her number). Sacral/hip pain today. Diarrhea improving. Denies SOB,CP, dizziness and nausea.   Objective Vitals:   03/01/22 1708 03/01/22 1717 03/01/22 1720 03/01/22 1956  BP: (!) 116/28  105/60 (!) 129/56  Pulse: (!) 106  63 72  Resp:  _0 Temp:    98.2 F (36.8 C)  TempSrc:    Oral  SpO2: 99%  100% 96%  Weight:      Height:       Physical Exam General: overweight, alert, NAD.  Heart: RRR; no murmur Lungs: CTA bilaterally, diminished breath sounds Abdomen: soft; non-distended. +BS Extremities: B/L AKA; no flank/hip edema Dialysis Access: [facility]  Additional Objective Labs: Basic Metabolic Panel: Recent Labs  Lab 02/27/22 0422 03/01/22 1255  NA 137 137  K 3.6 4.7  CL 94* 94*  CO2 26 24  GLUCOSE 121* 125*  BUN 19 46*  CREATININE 5.68* 10.60*  CALCIUM 9.5 10.0  PHOS  --  5.3*   Liver Function Tests: Recent Labs  Lab 02/27/22 0422 03/01/22 1255  AST 17  --   ALT 11  --   ALKPHOS 66  --   BILITOT 0.3  --   PROT 8.3*  --   ALBUMIN 3.0* 2.5*   No results for input(s): "LIPASE", "AMYLASE" in the last 168 hours. CBC: Recent Labs  Lab 02/27/22 0422 03/01/22 1255  WBC 12.2* 14.3*  HGB 9.5* 7.8*  HCT 32.2* 25.7*  MCV 98.5 97.0  PLT 279 261   Blood Culture    Component Value Date/Time   SDES BLOOD LEFT ANTECUBITAL 10/16/2021 1534   SPECREQUEST  10/16/2021 1534    BOTTLES DRAWN AEROBIC AND ANAEROBIC Blood Culture results may not be optimal due to an inadequate volume of blood received in culture bottles   CULT  10/16/2021 1534    NO GROWTH 5 DAYS Performed at [facility], [place] [address] [place], [place] [address]     REPTSTATUS 10/21/2021 FINAL 10/16/2021 1534    Cardiac Enzymes: No results for input(s): "CKTOTAL", "CKMB", "CKMBINDEX", "TROPONINI" in the last 168 hours. CBG: Recent Labs  Lab 03/01/22 1633 03/01/22 1701  GLUCAP 87 109*   Iron Studies: No results for input(s): "IRON", "TIBC", "TRANSFERRIN", "FERRITIN" in the last 72 hours. _1 @ Studies/Results: No results found. Medications:   aspirin EC  81 mg Oral Daily   atorvastatin  10 mg Oral Daily   Chlorhexidine Gluconate Cloth  6 each Topical Q0600   darbepoetin (ARANESP) injection - DIALYSIS  200 mcg Subcutaneous Q Tue-1800   docusate sodium  100 mg Oral BID   famotidine  20 mg Oral Daily   feeding supplement  1 Container Oral TID BM   gabapentin  100 mg Oral Q1200   gabapentin  200 mg Oral QHS   Gerhardt's butt cream   Topical BID   lactulose  10 g Oral Daily   levothyroxine  100 mcg Oral Q0600   loratadine  10 mg Oral Daily   melatonin  3 mg Oral QHS   midodrine  10 mg Oral Q M,W,F-HD   pantoprazole  40 mg Oral Daily   sevelamer  carbonate  4.8 g Oral TID WC    Assessment/Plan: Inability to obtain HD at OP center D/T body habitus and wound. Difficult situation with no clear answers yet. PT working with pt -  met with seating specialist to get evaluated for custom chair/cushion that will allow her sit for longer periods.  Appreciate efforts from PT team.  ESRD: Usual TTS schedule. New TDC placed 02/04/2022.  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 Surgery will not be able to do biopsy any time soon. Started empirically on sodium thiosulfate, but caused vomiting so stopped. Not on any calcium based products or vitamin D.  HypoTN/volume- On midodrine 11m qHD, with 187mprn mid HD w/parameters to keep SBP >100 d/t becoming symptomatic when it drops  lower.  Significant weight loss during admission.  Remains euvolemic on exam.  Severe ischial sacral decubitus wound: Limiting her, noted to be slow healing. Goal to tolerate sitting upright - which she is doing with PT. COPD history of hypoxia on [place] O2 at home (was on 5 L at baseline has been able to taper to 2-3 L [place]).  Anemia of ESRD - Hgb 7.8, declined but has been in the 7-9 range with no bleeding reported, follow trend. on max dose Aranesp 20078mweekly. Recent Fe load completed.  Secondary hyperparathyroidism: Phos at goal - continue Renvela 4.8g AC TID. CorrCa elevated. Not on VDRA, last PTH 60 on 01/30/22.  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 Nutrition - diet continues to be changed from renal.  Currently on regular diet.  Should stay on renal diet.  Disposition: Difficult situation. Prior to admit, patient required 6 staff members and 2 hoyer pads for transfer into HD chair as OP. Could not stay on HD for full tx. 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  The main issue is her Rt ischial wound. PT has arranged seating specialist with plan for custom power chair/cushion that would keep pressure off wounds and allow her to sit for longer periods.     SamAnice PaganiniA-C 03/02/2022, 11:32 AM  [facility] Pager: [phone]

## 2022-03-02 NOTE — Progress Notes (Signed)
No changes Had HD yesterday Continue current care  Domenic Polite, MD

## 2022-03-03 DIAGNOSIS — I5023 Acute on chronic systolic (congestive) heart failure: Secondary | ICD-10-CM | POA: Diagnosis not present

## 2022-03-03 LAB — CBC
HCT: 26.7 % — ABNORMAL LOW (ref 36.0–46.0)
Hemoglobin: 8.1 g/dL — ABNORMAL LOW (ref 12.0–15.0)
MCH: 29.2 pg (ref 26.0–34.0)
MCHC: 30.3 g/dL (ref 30.0–36.0)
MCV: 96.4 fL (ref 80.0–100.0)
Platelets: 259 10*3/uL (ref 150–400)
RBC: 2.77 MIL/uL — ABNORMAL LOW (ref 3.87–5.11)
RDW: 14.6 % (ref 11.5–15.5)
WBC: 11.6 10*3/uL — ABNORMAL HIGH (ref 4.0–10.5)
nRBC: 0 % (ref 0.0–0.2)

## 2022-03-03 LAB — BASIC METABOLIC PANEL
Anion gap: 14 (ref 5–15)
BUN: 33 mg/dL — ABNORMAL HIGH (ref 6–20)
CO2: 26 mmol/L (ref 22–32)
Calcium: 9.5 mg/dL (ref 8.9–10.3)
Chloride: 98 mmol/L (ref 98–111)
Creatinine, Ser: 8.13 mg/dL — ABNORMAL HIGH (ref 0.44–1.00)
GFR, Estimated: 6 mL/min — ABNORMAL LOW (ref >60)
Glucose, Bld: 110 mg/dL — ABNORMAL HIGH (ref 70–99)
Potassium: 4.2 mmol/L (ref 3.5–5.1)
Sodium: 138 mmol/L (ref 135–145)

## 2022-03-03 MED ORDER — MEDIHONEY WOUND/BURN DRESSING EX PSTE
1.0000 | PASTE | Freq: Every day | CUTANEOUS | Status: DC
  Filled 2022-03-03: qty 44

## 2022-03-03 MED ORDER — SODIUM THIOSULFATE 250 MG/ML IV SOLN
12.5000 g | INTRAVENOUS | Status: DC
  Administered 2022-03-08 – 2022-04-06 (×12): 12.5 g via INTRAVENOUS
  Filled 2022-03-03 (×28): qty 50

## 2022-03-03 NOTE — Progress Notes (Deleted)
[facility] Progress Note   Subjective:   Patient seen and examined at bedside.  Worsened abdominal wounds which have become more painful.  Reports burning sensation especially while on dialysis.  Discussed importance of controlling phosphorus and avoiding calcium/Vit D.  Patient has continuously had her diet changed from renal to regular d/t not liking to food options on the renal diet.  Discussed trying Na thiosulfate again to see if it was the cuase of n/v.   Objective Vitals:   03/01/22 1956 03/02/22 2116 03/03/22 0618 03/03/22 0853  BP: (!) 129/56 (!) 117/43 (!) 121/44 116/72  Pulse: 72 61 64 70  Resp: _0 Temp: 98.2 F (36.8 C) 98 F (36.7 C) 98.5 F (36.9 C) 98.4 F (36.9 C)  TempSrc: Oral Oral  Oral  SpO2: 96% 99% 94% 99%  Weight:      Height:       Physical Exam General: Alert, female in NAD Heart:RRR Lungs: nml WOB on 2L O2 via [place] Abdomen:soft, +wounds covered w/dressing Extremities:b/l AKA Dialysis Access: [facility]   Filed Weights   02/24/22 2049  Weight: 116.1 kg    Intake/Output Summary (Last 24 hours) at 03/03/2022 1400 Last data filed at 03/03/2022 0900 Gross per 24 hour  Intake 340 ml  Output 0 ml  Net 340 ml    Additional Objective Labs: Basic Metabolic Panel: Recent Labs  Lab 02/27/22 0422 03/01/22 1255  NA 137 137  K 3.6 4.7  CL 94* 94*  CO2 26 24  GLUCOSE 121* 125*  BUN 19 46*  CREATININE 5.68* 10.60*  CALCIUM 9.5 10.0  PHOS  --  5.3*   Liver Function Tests: Recent Labs  Lab 02/27/22 0422 03/01/22 1255  AST 17  --   ALT 11  --   ALKPHOS 66  --   BILITOT 0.3  --   PROT 8.3*  --   ALBUMIN 3.0* 2.5*    CBC: Recent Labs  Lab 02/27/22 0422 03/01/22 1255  WBC 12.2* 14.3*  HGB 9.5* 7.8*  HCT 32.2* 25.7*  MCV 98.5 97.0  PLT 279 261   CBG: Recent Labs  Lab 03/01/22 1633 03/01/22 1701  GLUCAP 87 109*    Medications:   aspirin EC  81 mg Oral Daily   atorvastatin  10 mg Oral Daily    Chlorhexidine Gluconate Cloth  6 each Topical Q0600   darbepoetin (ARANESP) injection - DIALYSIS  200 mcg Subcutaneous Q Tue-1800   docusate sodium  100 mg Oral BID   famotidine  20 mg Oral Daily   feeding supplement  1 Container Oral TID BM   gabapentin  100 mg Oral Q1200   gabapentin  200 mg Oral QHS   Gerhardt's butt cream   Topical BID   lactulose  10 g Oral Daily   leptospermum manuka honey  1 Application Topical Daily   levothyroxine  100 mcg Oral Q0600   loratadine  10 mg Oral Daily   melatonin  3 mg Oral QHS   [START ON 03/04/2022] midodrine  10 mg Oral Once per day on Tue Thu Sat   pantoprazole  40 mg Oral Daily   sevelamer carbonate  4.8 g Oral TID WC   Assessment/Plan: Inability to obtain HD at OP center D/T body habitus and wound. Difficult situation with no clear answers yet. PT working with pt -  met with seating specialist to get evaluated for custom chair/cushion that will allow her sit for longer periods.  Appreciate  efforts from PT team.  ESRD: Usual TTS schedule. New TDC placed 02/04/2022.  Clotted last week, increased heparin dose.  Plan for HD tomorrow per regular schedule. Painful nodular lesions in abdomen: Initially felt just d/t subq heparin injections, but now worsening.  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 Surgery will not be able to do biopsy any time soon. Started empirically on sodium thiosulfate, but caused vomiting so stopped. Not on any calcium based products or vitamin D. Discussed trying Na thiosulfate again since her wounds are worsened and changing back to Renal diet - she will think about it.  HypoTN/volume- On midodrine 71m qHD, with 126mprn mid HD w/parameters to keep SBP >100 d/t becoming symptomatic when it drops lower.  Significant weight loss during admission.  Remains euvolemic on exam.  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 Limiting her, noted to be slow healing. Goal to tolerate sitting upright - which she is  doing with PT. COPD history of hypoxia on [place] O2 at home (was on 5 L at baseline has been able to taper to 2-3 L [place]).  Anemia of ESRD - Hgb 7.8, declined but has been in the 7-9 range with no bleeding reported, follow trend. on max dose Aranesp 20072mweekly. Recent Fe load completed.  Secondary hyperparathyroidism: Phos at goal - continue Renvela 4.8g AC TID. CorrCa elevated. Not on VDRA, last PTH 60 on 01/30/22.  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 Nutrition - diet continues to be changed from renal.  Currently on regular diet.  Should stay on renal diet - she is considering changing back.  Disposition: Difficult situation. Prior to admit, patient required 6 staff members and 2 hoyer pads for transfer into HD chair as OP. Could not stay on HD for full tx. 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  The main issue is her Rt ischial wound. PT has arranged seating specialist with plan for custom power chair/cushion that would keep pressure off wounds and allow her to sit for longer periods.  LinJen MowA-C [facility] 03/03/2022,2:00 PM  LOS: 122 days

## 2022-03-03 NOTE — Progress Notes (Signed)
Pt is concerned about the wound on right side/buttock area and how it has gotten larger within a few days time. Pt also wanted to know more information regarding the possible diagnosis of Calciphylaxis. This RN and charge RN Maudie Mercury spoke with pt and provided education within our scope and instructed patient to speak with the doctor regarding further questioning. Pt wants a wound nurse to assess wounds to right side/buttock and newly open wound on the Left lower abdomen. RN messaged MD on call for an order for a [place] consult. RN will continue to monitor pt and will inform the on coming shift to make sure that patient gets her questions answered.   Courtney Gray

## 2022-03-03 NOTE — Consult Note (Signed)
WOC Nurse Consult Note: Patient receiving care in [facility] 5M06. Primary nurse Ute, present at time of my assessment. Reason for Consult: right hip and LLQ of abdomen wounds.  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  It is primarily clean and pink, but does have a central yellow/brown area.  The area around this wound is hard and darkened. The LLQ of the abdomen wound is 1 cm x 1 cm and is 100% pink without depth.  The surrounding tissue is darkened and hard. Wound bed: Drainage (amount, consistency, odor) none Periwound: Dressing procedure/placement/frequency: Cleanse wounds to right hip and LLQ of abdomen with saline, pat dry. Place a nickel thick layer of Medihoney over the areas, dry gauze, and foam dressing. The patient's questions were answered to her expressed satisfaction. Monitor the wound area(s) for worsening of condition such as: Signs/symptoms of infection,  Increase in size,  Development of or worsening of odor, Development of pain, or increased pain at the affected locations.  Notify the medical team if any of these develop.  Thank you for the consult.  Discussed plan of care with the patient and bedside nurse.  [place] nurse will not follow at this time.  Please re-consult the [facility] team if needed.  Val Riles, RN, MSN, CWOCN, CNS-BC, pager [phone]

## 2022-03-03 NOTE — Consult Note (Signed)
WOC Nurse Consult Note: Patient receiving care in [facility] 5M06. Reason for Consult: Right ischial wound-chronic There are orders in the record for this wound. I spoke with the primary nurse, Ute, and she is now aware of where the orders for this wound are.   [place] nurse will not follow at this time.  Please re-consult the [place] team if needed.  Val Riles, RN, MSN, CWOCN, CNS-BC, pager [phone]

## 2022-03-03 NOTE — Progress Notes (Signed)
Medahoney applied to wound right hip, patient subsequently c/o severe burning to wound, crying. Requesting to remove medahoney. Rinsed with sterile NS, previous dressing with foam applied per patient request. Dr Broadus John and [place] notified.

## 2022-03-03 NOTE — Progress Notes (Addendum)
[facility] Progress Note   Subjective:   Patient seen and examined at bedside.  Worsened abdominal wounds which have become more painful.  Reports burning sensation especially while on dialysis.  Discussed importance of controlling phosphorus and avoiding calcium/Vit D.  Patient has continuously had her diet changed from renal to regular d/t not liking to food options on the renal diet.  Discussed trying Na thiosulfate again to see if it was the cuase of n/v.   Objective Vitals:   03/02/22 2116 03/03/22 0618 03/03/22 0853 03/03/22 1840  BP: (!) 117/43 (!) 121/44 116/72 (!) 117/58  Pulse: 61 64 70 69  Resp: _0 Temp: 98 F (36.7 C) 98.5 F (36.9 C) 98.4 F (36.9 C) 98.4 F (36.9 C)  TempSrc: Oral  Oral Oral  SpO2: 99% 94% 99% 100%  Weight:      Height:       Physical Exam General: Alert, female in NAD Heart:RRR Lungs: nml WOB on 2L O2 via [place] Abdomen:soft, +wounds covered w/dressing Extremities:b/l AKA Dialysis Access: [facility]   Filed Weights   02/24/22 2049  Weight: 116.1 kg    Intake/Output Summary (Last 24 hours) at 03/03/2022 1851 Last data filed at 03/03/2022 0900 Gross per 24 hour  Intake 120 ml  Output 0 ml  Net 120 ml    Additional Objective Labs: Basic Metabolic Panel: Recent Labs  Lab 02/27/22 0422 03/01/22 1255 03/03/22 1708  NA 137 137 138  K 3.6 4.7 4.2  CL 94* 94* 98  CO2 _1 GLUCOSE 121* 125* 110*  BUN 19 46* 33*  CREATININE 5.68* 10.60* 8.13*  CALCIUM 9.5 10.0 9.5  PHOS  --  5.3*  --    Liver Function Tests: Recent Labs  Lab 02/27/22 0422 03/01/22 1255  AST 17  --   ALT 11  --   ALKPHOS 66  --   BILITOT 0.3  --   PROT 8.3*  --   ALBUMIN 3.0* 2.5*   CBC: Recent Labs  Lab 02/27/22 0422 03/01/22 1255 03/03/22 1708  WBC 12.2* 14.3* 11.6*  HGB 9.5* 7.8* 8.1*  HCT 32.2* 25.7* 26.7*  MCV 98.5 97.0 96.4  PLT 279 261 259   Blood Culture    Component Value Date/Time   SDES BLOOD LEFT ANTECUBITAL  10/16/2021 1534   SPECREQUEST  10/16/2021 1534    BOTTLES DRAWN AEROBIC AND ANAEROBIC Blood Culture results may not be optimal due to an inadequate volume of blood received in culture bottles   CULT  10/16/2021 1534    NO GROWTH 5 DAYS Performed at [facility], [address] [place], [place] [address]    REPTSTATUS 10/21/2021 FINAL 10/16/2021 1534     CBG: Recent Labs  Lab 03/01/22 1633 03/01/22 1701  GLUCAP 87 109*    Medications:  [START ON 03/04/2022] sodium thiosulfate 12.5 g in sodium chloride 0.9 % 150 mL Infusion for Calciphylaxis      aspirin EC  81 mg Oral Daily   atorvastatin  10 mg Oral Daily   Chlorhexidine Gluconate Cloth  6 each Topical Q0600   darbepoetin (ARANESP) injection - DIALYSIS  200 mcg Subcutaneous Q Tue-1800   docusate sodium  100 mg Oral BID   famotidine  20 mg Oral Daily   feeding supplement  1 Container Oral TID BM   gabapentin  100 mg Oral Q1200   gabapentin  200 mg Oral QHS   Gerhardt's butt cream   Topical  BID   lactulose  10 g Oral Daily   leptospermum manuka honey  1 Application Topical Daily   levothyroxine  100 mcg Oral Q0600   loratadine  10 mg Oral Daily   melatonin  3 mg Oral QHS   [START ON 03/04/2022] midodrine  10 mg Oral Once per day on Tue Thu Sat   pantoprazole  40 mg Oral Daily   sevelamer carbonate  4.8 g Oral TID WC    Assessment/Plan: Inability to obtain HD at OP center D/T body habitus and wound. Difficult situation with no clear answers yet. PT working with pt -  met with seating specialist to get evaluated for custom chair/cushion that will allow her sit for longer periods.  Appreciate efforts from PT team.  ESRD: Usual TTS schedule. New TDC placed 02/04/2022.  Clotted last week, increased heparin dose.  Plan for HD tomorrow per regular schedule. Painful nodular lesions in abdomen: Initially felt just d/t subq heparin injections, but now worsening.  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 Surgery will not be able to do  biopsy any time soon. Started empirically on sodium thiosulfate, but caused vomiting so stopped. Not on any calcium based products or vitamin D. Discussed trying Na thiosulfate again since her wounds are worsened and changing back to Renal diet - she will think about it.  HypoTN/volume- On midodrine 41m qHD, with 116mprn mid HD w/parameters to keep SBP >100 d/t becoming symptomatic when it drops lower.  Significant weight loss during admission.  Remains euvolemic on exam.  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 Limiting her, noted to be slow healing. Goal to tolerate sitting upright - which she is doing with PT. COPD history of hypoxia on [place] O2 at home (was on 5 L at baseline has been able to taper to 2-3 L [place]).  Anemia of ESRD - Hgb 7.8, declined but has been in the 7-9 range with no bleeding reported, follow trend. on max dose Aranesp 20066mweekly. Recent Fe load completed.  Secondary hyperparathyroidism: Phos at goal - continue Renvela 4.8g AC TID. CorrCa elevated. Not on VDRA, last PTH 60 on 01/30/22.  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 Nutrition - diet continues to be changed from renal.  Currently on regular diet.  Should stay on renal diet - she is considering changing back.  Disposition: Difficult situation. Prior to admit, patient required 6 staff members and 2 hoyer pads for transfer into HD chair as OP. Could not stay on HD for full tx. 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  The main issue is her Rt ischial wound. PT has arranged seating specialist with plan for custom power chair/cushion that would keep pressure off wounds and allow her to sit for longer periods  LinJen MowA-C [facility] 03/03/2022,6:51 PM  LOS: 122 days

## 2022-03-03 NOTE — TOC Progression Note (Signed)
Transition of Care [facility]) - Progression Note    Patient Details  Name: Courtney Gray MRN: [id number] Date of Birth: 1970/05/12  Transition of Care [facility]) CM/SW [place], LCSW Phone Number: 03/03/2022, 4:40 PM  Clinical Narrative:    CSW continuing to follow. Barriers to placement include inability to sit for HD.    Expected Discharge Plan: Long Term Nursing Home Barriers to Discharge: No SNF bed, Transportation, Waiting for outpatient dialysis  Expected Discharge Plan and Services Expected Discharge Plan: [place]       Living arrangements for the past 2 months: Single Family Home                                       Social Determinants of Health (SDOH) Interventions    Readmission Risk Interventions    09/16/2021    4:36 PM 08/28/2021   11:50 AM 07/12/2021   12:38 PM  Readmission Risk Prevention Plan  Transportation Screening Complete Complete Complete  Medication Review [employer])  Complete Complete  PCP or Specialist appointment within 3-5 days of discharge  Complete Complete  HRI or Home Care Consult Complete Complete Complete  SW Recovery Care/Counseling Consult Complete Complete Complete  Palliative Care Screening  Not Applicable Not Applicable  Skilled Nursing Facility Complete Not Applicable Complete

## 2022-03-03 NOTE — Progress Notes (Signed)
PROGRESS NOTE    Courtney Gray  KWI:[id number] DOB: 05/28/1970 DOA: 11/01/2021 PCP: Monico Blitz, MD  Chronically ill obese bedbound with ESRD, peripheral vascular disease with right AKA, morbid obesity chronic respiratory failure on home O2 and sacral decubitus wounds, recent lengthy hospitalizations with bacteremia, hepatic and splenic abscess requiring amputation of third digit of left hand and left AKA, now bilateral AKA, splenic drain, liver abscess drain, sacral decubitus wound was admitted with progressive dyspnea on exertion in the setting of missed hemodialysis, per outpatient HD records it was taking 4 staff members and a [place] lift for patient to be seated in a dialysis chair. 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, considering 4-week antibiotic therapy   Subjective: -Upset about not being able to eat prior to HD, denies further diarrhea, has pain in her sacrum, nodules  Assessment and Plan:  Volume overload End stage renal disease ([place]) -See discussion above,  -Has not been successful in chair dialysis, limited by deep decubitus wound -TOC team following, to assist with placement and dialysis center where she can dialyze on a stretcher -Complicated by large sacral decubitus wound and bilateral AKA  Tender nodules, in abdominal fat -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, empirically treated with sodium thiosulfate on HD 11/18 -Then discontinued on account of persistent nausea and vomiting -Now on oxycodone, continue laxatives as needed -Seen by general surgery, they were unable to biopsy her anytime soon  Deep sacral decubitus wound -Extremely deep wound, does not appear  overtly infected at this time, minimal serous drainage -CT abdomen pelvis 11/13 with some signs of osteomyelitis near right ischium, clinically no signs or symptoms of infection, suspect this could be chronic osteomyelitis -ESR is 96, CRP 13, ID consulted 11/22 -They will follow-up today, continue wound care, she has significant concerns about long antibiotic therapy  Anemia of chronic kidney disease, Downtrend in hemoglobin noted Continue with EPO and Iron supplementation.  -CBC in a.m.  Hyperkalemia, hypomagnesemia, hypokalemia, hyponatremia, hypophosphatemia -corrected with renal replacement therapy.   Hypertension -On midodrine with HD Currently off antihypertensive medications.   History of IBS -Intermittent constipation, resolved after lactulose -Lactulose dose decreased  Type 2 diabetes mellitus with hyperlipidemia ([place]) -Stable  Bacteremia Patient has completed antibiotic therapy. Bacteroides, splenic abscess.   PVD (peripheral vascular disease) (HCC) Bilateral AKA.   COPD -Stable, on home O2 2 L  Class 3 obesity (HCC) Calculated BMI is 65,3   Acute on chronic diastolic CHF (congestive heart failure) (HCC) Volume regulation with ultrafiltration in hemodialysis Continue midodrine on dialysis days  DVT prophylaxis: Heparin subcutaneous Code Status: Full code Family Communication: No family at bedside Disposition Plan: To be determined  Consultants: Nephrology   Procedures:   Antimicrobials:    Objective: Vitals:   03/01/22 1956 03/02/22 2116 03/03/22 0618 03/03/22 0853  BP: (!) 129/56 (!) 117/43 (!) 121/44 116/72  Pulse: 72 61 64 70  Resp: _0 Temp: 98.2 F (36.8 C) 98 F (36.7 C) 98.5 F (36.9 C) 98.4 F (36.9 C)  TempSrc: Oral Oral  Oral  SpO2: 96% 99% 94% 99%  Weight:      Height:        Intake/Output Summary (Last 24 hours) at 03/03/2022 1126 Last data filed at 03/03/2022 0900 Gross  per 24 hour  Intake 340 ml  Output 0 ml   Net 340 ml   Filed Weights   02/24/22 2049  Weight: 116.1 kg    Examination:  General exam: Obese chronically ill female sitting up in bed, AAOx3, no distress CVS: S1-S2, regular rhythm Lungs: Clear bilaterally Abdomen: Soft, obese, large abdominal pannus with multiple tender nodular areas Extremities: Bilateral AKA Skin: Deep sacral decubitus wound over right ischium with minimal serous drainage Hello Paige Psychiatry:  Mood & affect appropriate.     Data Reviewed:   CBC: Recent Labs  Lab 02/27/22 0422 03/01/22 1255  WBC 12.2* 14.3*  HGB 9.5* 7.8*  HCT 32.2* 25.7*  MCV 98.5 97.0  PLT 279 774   Basic Metabolic Panel: Recent Labs  Lab 02/27/22 0422 03/01/22 1255  NA 137 137  K 3.6 4.7  CL 94* 94*  CO2 26 24  GLUCOSE 121* 125*  BUN 19 46*  CREATININE 5.68* 10.60*  CALCIUM 9.5 10.0  PHOS  --  5.3*   GFR: Estimated Creatinine Clearance: 8 mL/min (A) (by C-G formula based on SCr of 10.6 mg/dL (H)). Liver Function Tests: Recent Labs  Lab 02/27/22 0422 03/01/22 1255  AST 17  --   ALT 11  --   ALKPHOS 66  --   BILITOT 0.3  --   PROT 8.3*  --   ALBUMIN 3.0* 2.5*   No results for input(s): "LIPASE", "AMYLASE" in the last 168 hours. No results for input(s): "AMMONIA" in the last 168 hours. Coagulation Profile: No results for input(s): "INR", "PROTIME" in the last 168 hours. Cardiac Enzymes: No results for input(s): "CKTOTAL", "CKMB", "CKMBINDEX", "TROPONINI" in the last 168 hours. BNP (last 3 results) No results for input(s): "PROBNP" in the last 8760 hours. HbA1C: No results for input(s): "HGBA1C" in the last 72 hours. CBG: Recent Labs  Lab 03/01/22 1633 03/01/22 1701  GLUCAP 87 109*    Lipid Profile: No results for input(s): "CHOL", "HDL", "LDLCALC", "TRIG", "CHOLHDL", "LDLDIRECT" in the last 72 hours. Thyroid Function Tests: No results for input(s): "TSH", "T4TOTAL", "FREET4", "T3FREE", "THYROIDAB" in the last 72 hours. Anemia Panel: No  results for input(s): "VITAMINB12", "FOLATE", "FERRITIN", "TIBC", "IRON", "RETICCTPCT" in the last 72 hours. Urine analysis: No results found for: "COLORURINE", "APPEARANCEUR", "LABSPEC", "PHURINE", "GLUCOSEU", "HGBUR", "BILIRUBINUR", "KETONESUR", "PROTEINUR", "UROBILINOGEN", "NITRITE", "LEUKOCYTESUR" Sepsis Labs: _0 (procalcitonin:4,lacticidven:4)  )No results found for this or any previous visit (from the past 240 hour(s)).   Radiology Studies: No results found.   Scheduled Meds:  aspirin EC  81 mg Oral Daily   atorvastatin  10 mg Oral Daily   Chlorhexidine Gluconate Cloth  6 each Topical Q0600   darbepoetin (ARANESP) injection - DIALYSIS  200 mcg Subcutaneous Q Tue-1800   docusate sodium  100 mg Oral BID   famotidine  20 mg Oral Daily   feeding supplement  1 Container Oral TID BM   gabapentin  100 mg Oral Q1200   gabapentin  200 mg Oral QHS   Gerhardt's butt cream   Topical BID   lactulose  10 g Oral Daily   levothyroxine  100 mcg Oral Q0600   loratadine  10 mg Oral Daily   melatonin  3 mg Oral QHS   [START ON 03/04/2022] midodrine  10 mg Oral Once per day on Tue Thu Sat   pantoprazole  40 mg Oral Daily   sevelamer carbonate  4.8 g Oral TID WC   Continuous Infusions:     LOS: 122 days  Time spent: 43mn    PDomenic Polite MD [facility]   03/03/2022, 11:26 AM

## 2022-03-04 ENCOUNTER — Other Ambulatory Visit (HOSPITAL_COMMUNITY): Payer: Self-pay

## 2022-03-04 DIAGNOSIS — I5023 Acute on chronic systolic (congestive) heart failure: Secondary | ICD-10-CM | POA: Diagnosis not present

## 2022-03-04 MED ORDER — DOXYCYCLINE HYCLATE 100 MG PO TABS
100.0000 mg | ORAL_TABLET | Freq: Two times a day (BID) | ORAL | Status: DC
  Administered 2022-03-05 – 2022-03-06 (×3): 100 mg via ORAL
  Filled 2022-03-04 (×5): qty 1

## 2022-03-04 NOTE — Progress Notes (Signed)
[facility] Progress Note   Subjective:   Patient seen and examined at bedside.  Agreed to try 1/2 dose of Na thiosulfate today. Shows me an itchy rash on the back of her neck, present for several days, that she has been putting spray hair oil on to try to provide additional moisture with no relief. 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 Feeling tired today.  Denies CP, abdominal pain and n/v/d. Per PMD asked ID to re-eval patient for possible chronic osteo noted on CT scan last week. Also reports feeling urge to urinate but not being able to since Saturday.  Refusing in and out catheter or other "invasive" evaluation.   Objective Vitals:   03/03/22 1840 03/03/22 2252 03/04/22 0626 03/04/22 0929  BP: (!) 117/58 (!) 112/58 115/71 (!) 130/57  Pulse: 69 78 65 73  Resp: _0 Temp: 98.4 F (36.9 C) 97.9 F (36.6 C) 98.5 F (36.9 C) 98.2 F (36.8 C)  TempSrc: Oral Oral Oral Oral  SpO2: 100% 100% 90% 97%  Weight:      Height:       Physical Exam General:well appearing female in NAD Neck: oval irregular shaped area of darkened skin on back of neck w/dryness  Heart:RRR Lungs:CTAB, nml WOB on 2l O2 via [place] Abdomen:soft, NTND, dressing over wounds on abdomen Extremities:b/l AKA Dialysis Access: [facility]   Filed Weights   02/24/22 2049  Weight: 116.1 kg    Intake/Output Summary (Last 24 hours) at 03/04/2022 1209 Last data filed at 03/04/2022 0800 Gross per 24 hour  Intake 460 ml  Output 0 ml  Net 460 ml    Additional Objective Labs: Basic Metabolic Panel: Recent Labs  Lab 02/27/22 0422 03/01/22 1255 03/03/22 1708  NA 137 137 138  K 3.6 4.7 4.2  CL 94* 94* 98  CO2 _1 GLUCOSE 121* 125* 110*  BUN 19 46* 33*  CREATININE 5.68* 10.60* 8.13*  CALCIUM 9.5 10.0 9.5  PHOS  --  5.3*  --    Liver Function Tests: Recent Labs  Lab 02/27/22 0422 03/01/22 1255  AST 17  --   ALT 11  --   ALKPHOS 66  --    BILITOT 0.3  --   PROT 8.3*  --   ALBUMIN 3.0* 2.5*   CBC: Recent Labs  Lab 02/27/22 0422 03/01/22 1255 03/03/22 1708  WBC 12.2* 14.3* 11.6*  HGB 9.5* 7.8* 8.1*  HCT 32.2* 25.7* 26.7*  MCV 98.5 97.0 96.4  PLT 279 261 259   CBG: Recent Labs  Lab 03/01/22 1633 03/01/22 1701  GLUCAP 87 109*   Medications:  sodium thiosulfate 12.5 g in sodium chloride 0.9 % 150 mL Infusion for Calciphylaxis      aspirin EC  81 mg Oral Daily   atorvastatin  10 mg Oral Daily   Chlorhexidine Gluconate Cloth  6 each Topical Q0600   darbepoetin (ARANESP) injection - DIALYSIS  200 mcg Subcutaneous Q Tue-1800   docusate sodium  100 mg Oral BID   famotidine  20 mg Oral Daily   feeding supplement  1 Container Oral TID BM   gabapentin  100 mg Oral Q1200   gabapentin  200 mg Oral QHS   Gerhardt's butt cream   Topical BID   lactulose  10 g Oral Daily   levothyroxine  100 mcg Oral Q0600   loratadine  10 mg Oral Daily   melatonin  3 mg  Oral QHS   midodrine  10 mg Oral Once per day on Tue Thu Sat   pantoprazole  40 mg Oral Daily   sevelamer carbonate  4.8 g Oral TID WC    Assessment/Plan: Inability to obtain HD at OP center D/T body habitus and wound. Difficult situation with no clear answers yet. PT working with pt -  met with seating specialist to get evaluated for custom chair/cushion that will allow her sit for longer periods.  Appreciate efforts from PT team.  ESRD: Usual TTS schedule. New TDC placed 02/04/2022.  Clotted last week, increased heparin dose.  Plan for HD today per regular schedule. Painful nodular lesions in abdomen: Initially felt just d/t subq heparin injections, but now worsening.  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 Surgery will not be able to do biopsy any time soon. Started empirically on sodium thiosulfate, but caused vomiting so stopped. Not on any calcium based products or vitamin D. Agreed to try 1/2 dose Na thiosulfate with HD today ~ 12.5g, will titrate up if tolerated.   Neck rash - etiology unclear.  ?contact dermatitis. PMD made aware.  HypoTN/volume- On midodrine 69m qHD, with 179mprn mid HD w/parameters to keep SBP >100 d/t becoming symptomatic when it drops lower.  Significant weight loss during admission.  Remains euvolemic on exam.  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 Limiting her, noted to be slow healing. Goal to tolerate sitting upright - which she is doing with PT.  ID to eval.  COPD history of hypoxia on [place] O2 at home (was on 5 L at baseline has been able to taper to 2-3 L [place]).  Anemia of ESRD - Hgb 8.1,  on max dose Aranesp 20046mweekly. Recent Fe load completed.  Secondary hyperparathyroidism: Phos at goal - continue Renvela 4.8g AC TID. CorrCa elevated. Not on VDRA, last PTH 60 on 01/30/22. Avoid all calcium based medications for Vitamin D d/t concern for calciphylaxis.  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 Nutrition - diet continues to be changed from renal.  Currently on regular diet.  Should stay on renal diet - she is considering changing back.  Disposition: Difficult situation. Prior to admit, patient required 6 staff members and 2 hoyer pads for transfer into HD chair as OP. Could not stay on HD for full tx. 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  The main issue is her Rt ischial wound. PT has arranged seating specialist with plan for custom power chair/cushion that would keep pressure off wounds and allow her to sit for longer periods  LinJen MowA-C [facility]/28/2023,12:09 PM  LOS: 123 days

## 2022-03-04 NOTE — Progress Notes (Signed)
[place] for Infectious Disease    Date of Admission:  11/01/2021     ID: Courtney Gray is a 51 y.o. female with   Principal Problem:   Acute on chronic systolic CHF (congestive heart failure) (HCC) Active Problems:   Anemia due to chronic kidney disease   Hypertensive emergency   End stage renal disease (HCC)   Chronic respiratory failure with hypoxia (HCC)   Leukocytosis   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HCC)   Pressure injury of skin   Hypertension   Type 2 diabetes mellitus with hyperlipidemia (HCC)   Decubitus ulcer of sacral region, stage 3 (HCC)   Class 3 obesity (HCC)   Bacteremia   PVD (peripheral vascular disease) (HCC)   Acute on chronic diastolic CHF (congestive heart failure) (HCC)   PAD (peripheral artery disease) (HCC)   Irritable bowel syndrome (IBS)   Amputation of left middle finger   GERD (gastroesophageal reflux disease)   Nodule of skin of abdomen    Subjective: Tenderness to right flank for calciphylaxis region.  No pain to ischial lesion  Medications:   aspirin EC  81 mg Oral Daily   atorvastatin  10 mg Oral Daily   Chlorhexidine Gluconate Cloth  6 each Topical Q0600   darbepoetin (ARANESP) injection - DIALYSIS  200 mcg Subcutaneous Q Tue-1800   docusate sodium  100 mg Oral BID   doxycycline  100 mg Oral BID WC   famotidine  20 mg Oral Daily   feeding supplement  1 Container Oral TID BM   gabapentin  100 mg Oral Q1200   gabapentin  200 mg Oral QHS   Gerhardt's butt cream   Topical BID   lactulose  10 g Oral Daily   levothyroxine  100 mcg Oral Q0600   loratadine  10 mg Oral Daily   melatonin  3 mg Oral QHS   midodrine  10 mg Oral Once per day on Tue Thu Sat   pantoprazole  40 mg Oral Daily   sevelamer carbonate  4.8 g Oral TID WC    Objective: Vital signs in last 24 hours: Temp:  [97.9 F (36.6 C)-98.5 F (36.9 C)] 98.2 F (36.8 C) (11/28 0929) Pulse Rate:  [65-78] 73 (11/28 0929) Resp:  [15-18] 18 (11/28  0929) BP: (112-130)/(57-71) 130/57 (11/28 0929) SpO2:  [90 %-100 %] 97 % (11/28 0929)  Physical Exam  Constitutional:  oriented to person, place, and time. appears well-developed and well-nourished. No distress.  HENT: [place]/AT, PERRLA, no scleral icterus Mouth/Throat: Oropharynx is clear and moist. No oropharyngeal exudate.  Cardiovascular: Normal rate, regular rhythm and normal heart sounds. Exam reveals no gallop and no friction rub.  No murmur heard.  Pulmonary/Chest: Effort normal and breath sounds normal. No respiratory distress.  has no wheezes.  Neck = supple, no nuchal rigidity Abdominal: Soft. Bowel sounds are normal.  exhibits no distension. There is no tenderness. Shallow ulcer from calciphylaxis Ischial wound - less deep some exudate, see picture Lymphadenopathy: no cervical adenopathy. No axillary adenopathy Neurological: alert and oriented to person, place, and time.  Skin: Skin is warm and dry. No rash noted. No erythema.  Psychiatric: a normal mood and affect.  behavior is normal.    Lab Results Recent Labs    03/03/22 1708  WBC 11.6*  HGB 8.1*  HCT 26.7*  NA 138  K 4.2  CL 98  CO2 26  BUN 33*  CREATININE 8.13*   Lab Results  Component Value Date  ESRSEDRATE 96 (H) 02/27/2022   Lab Results  Component Value Date   CRP 13.8 (H) 02/27/2022    Microbiology:  Studies/Results: No results found.   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  I have personally spent 50 minutes involved in face-to-face and non-face-to-face activities for this patient on the day of the visit. Professional time spent includes the following activities: Preparing to see the patient (review of tests), Obtaining and/or reviewing separately obtained history (admission/discharge  record), Performing a medically appropriate examination and/or evaluation , Ordering medications/tests/procedures, referring and communicating with other health care professionals, Documenting clinical information in the EMR, Independently interpreting results (not separately reported), Communicating results to the patient/family/caregiver, Counseling and educating the patient/family/caregiver and Care coordination (not separately reported).     [facility] for Infectious Diseases Pager: [phone]  03/04/2022, 4:16 PM

## 2022-03-04 NOTE — Progress Notes (Signed)
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    PT Comments    Continuing work on functional mobility and activity tolerance;  Session focused on functional transfers and problem-solving through positioning in her power chair for better activity tolerance/ tolerance of time in the power wc;   Spent a considerable amount of time listening to Courtney Gray's concerns re: last PT session; Validated her concerns and took the opportunity to remind Courtney Gray of rationale for pushing her to spend more time OOB in the power chair: to get to her goal of independence (see general comments for details);  Used demonstration to continue education re: transfers to/from wc with sliding board; Tried to help pt more understand precautions re: direction of transfers (Ok to scoot to L, use lift to transfer to R) by performing the transfers and asking pt to give instructions/suggestions; Showed pt how using the recline feature will change hip flexion angle, and will likely help with positioning for less contact between R armrest and lesion on R flank, and more comfort with the goal of increased sitting tolerance, and more independence   Recommendations for follow up therapy are one component of a multi-disciplinary discharge planning process, led by the attending physician.  Recommendations may be updated based on patient status, additional functional criteria and insurance authorization.  Follow Up Recommendations  Skilled nursing-short term rehab (<3 hours/day) Can patient physically be  transported by private vehicle: No   Assistance Recommended at Discharge Intermittent Supervision/Assistance  Patient can return home with the following A lot of help with walking and/or transfers;A lot of help with bathing/dressing/bathroom;Assist for transportation;Help with stairs or ramp for entrance   Equipment Recommendations  Wheelchair (measurements PT);Wheelchair cushion (measurements PT);Other (comment);Hospital bed (more accomodating mechanical lift, with sling that handles pt's with bil AKAs; Roho cushion (20x24), power wheelchair with power tilt and recline)    Recommendations for Other Services       Precautions / Restrictions Precautions Precautions: Fall Precaution Comments: bil AKA, 2L O2 at baseline; when in poer chair, MUST perform pressure relief every 30 minutes by tilting back for 10 minutes Restrictions RUE Weight Bearing: Weight bearing as tolerated LUE Weight Bearing: Weight bearing as tolerated Other Position/Activity Restrictions: when in power chair, MUST perform pressure relief every 30 minutes by tilting back for 10 minutes     Mobility  Bed Mobility Overal bed mobility: Modified Independent       Supine to sit: Modified independent (Device/Increase time)     General bed mobility comments: pulls on rails to sit upright in bed    Transfers                   General transfer comment: This PT performed WC to bed transfer and then bed to wheelchair transfer (RN present for second transfer), including placing, using, and taking out sliding board; Gave pt opportunity to give instructions and suggestions (teach back style), with good patient participation, and pt giving good suggestions for hand placement, weight shifting    Ambulation/Gait  Stairs             Therapist, art Details (indicate cue type and reason): Discussed last session's experience with the power  chair, when painful R flank wound limited tolerance of positioning in the chair; We were focused on tilting to relieve pressure on the ischial wound; Discussed wc trial status with vendor rep, and he suggested using the recline feature more to incr hip flexion angle, and potentially allow for more freedom of movement in her trunk; Pt open to trying this  Modified Rankin (Stroke Patients Only)       Balance     Sitting balance-[name] Scale: Good                                      Cognition Arousal/Alertness: Awake/alert Behavior During Therapy: WFL for tasks assessed/performed Overall Cognitive Status: Within Functional Limits for tasks assessed (for simple tasks)                                 General Comments: Concern for pt remembering to perform pressure relief and following through with pressure relief        Exercises      General Comments General comments (skin integrity, edema, etc.): Pt initiated PT session with voicing her that her experience last session was very unpleasant, and she does not want to experience that again; this PT listened, validated pt's experience, and thanked her for sharing her concerns with me; when I percieved an opening, I conveyed to pt that last session when she agreed to getting up, I did take that opportunity to help her to the wc, and while last PT session was unpleasant, we did learn from it; I also emphasized that everything I ask her to do is to move towards her goal of independence; We discussed the reality that in order to ask for our organization to pay for a specialty power chair that she needs, we need to show that the chair is helping her progress towards her goal of independence; we need to practice and problem-solve with the chair, and to do so, she must spend time in the chair      Pertinent Vitals/Pain Pain Assessment Pain Assessment: Faces Faces Pain Scale: Hurts a little bit Pain Intervention(s):  Monitored during session    Home Living                          Prior Function            PT Goals (current goals can now be found in the care plan section) Acute Rehab PT Goals Patient Stated Goal: Getting time in [facility], getting out and about PT Goal Formulation: With patient Time For Goal Achievement: 03/11/22 Potential to Achieve Goals: Good Progress towards PT goals: Progressing toward goals (Slowly)    Frequency    Min 2X/week      PT Plan Current plan remains appropriate    Co-evaluation              AM-PAC PT "6 Clicks" Mobility   Outcome Measure  Help needed turning from your back to your side while in a flat bed without using bedrails?: None Help needed moving from lying on your back to sitting on the side of a flat bed  without using bedrails?: None Help needed moving to and from a bed to a chair (including a wheelchair)?: A Lot Help needed standing up from a chair using your arms (e.g., wheelchair or bedside chair)?: Total Help needed to walk in hospital room?: Total Help needed climbing 3-5 steps with a railing? : Total 6 Click Score: 13    End of Session Equipment Utilized During Treatment: Oxygen (pt took oxygen off halfway through session) Activity Tolerance: Patient tolerated treatment well Patient left: in bed;with call bell/phone within reach Nurse Communication: Mobility status;Other (comment) (Need for more time and practice in the power chair, rationale for getting up even when pt would rather stay in bed) PT Visit Diagnosis: Other abnormalities of gait and mobility (R26.89);Muscle weakness (generalized) (M62.81) Pain - Right/Left: Right Pain - part of body: Hip;Leg (ischial tuberosity; R abdominal nodular pain)     Time: 8270-7867 PT Time Calculation (min) (ACUTE ONLY): 83 min  Charges:  $Therapeutic Activity: 8-22 mins $Self Care/Home Management: 38-52 $Wheel Chair Management: 23-37 mins                     Roney Marion,  PT  Acute Rehabilitation Services Office [phone]    Courtney Gray 03/04/2022, 8:02 PM

## 2022-03-04 NOTE — Progress Notes (Signed)
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, considering 4-week antibiotic therapy   Subjective: -She shows me her right flank/lateral ulcer -Wondering whether pain meds will hold her in dialysis  Assessment and Plan:  Volume overload End stage renal disease ([place]) -See discussion above,  -Has not been successful in chair dialysis, limited by deep decubitus wound -TOC team following, without accepting outpatient dialysis center at this time -Complicated by large sacral decubitus wound and bilateral AKA  Abdominal ulcers Tender nodules, in abdominal fat -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, empirically treated with sodium thiosulfate on HD 11/18 -Then discontinued on account of persistent nausea and vomiting -Now on oxycodone, continue laxatives as needed -Seen by general surgery, they were unable to biopsy her anytime soon -Nephrology to reattempt  sodium thiosulfate at half dose  Deep sacral decubitus wound -Extremely deep wound, does not appear overtly infected at this time, minimal serous drainage, [facility] RN following -CT abdomen pelvis 11/13 with some signs of osteomyelitis near right ischium, clinically no signs or symptoms of infection, suspect this could be chronic osteomyelitis -ESR is 96, CRP 13, ID consulted 11/22 -They will follow-up today, continue wound care, she has significant concerns about long antibiotic therapy, without good options at this time  Anemia of chronic kidney disease, Downtrend in hemoglobin noted Continue with EPO and Iron supplementation.  -CBC in a.m.  Hyperkalemia, hypomagnesemia, hypokalemia, hyponatremia, hypophosphatemia -corrected with renal replacement therapy.   Hypertension -On midodrine with HD Currently off antihypertensive medications.   History of IBS -Intermittent constipation, resolved after lactulose -Lactulose dose decreased  Type 2 diabetes mellitus with hyperlipidemia ([place]) -Stable  Bacteremia Patient has completed antibiotic therapy. Bacteroides, splenic abscess.   PVD (peripheral vascular disease) (HCC) Bilateral AKA.   COPD -Stable, on home O2 2 L  Class 3 obesity (HCC) Calculated BMI is 65,3   Acute on chronic diastolic CHF (congestive heart failure) (HCC) Volume regulation with ultrafiltration in hemodialysis Continue midodrine on dialysis days  DVT prophylaxis: Heparin subcutaneous Code Status: Full code Family Communication: No family at bedside Disposition Plan: To be determined  Consultants: Nephrology   Procedures:   Antimicrobials:    Objective: Vitals:   03/03/22 1840 03/03/22 2252 03/04/22 0626 03/04/22 0929  BP: (!) 117/58 (!) 112/58 115/71 (!) 130/57  Pulse: 69 78 65 73  Resp: _0 Temp: 98.4 F (36.9 C) 97.9 F (36.6 C) 98.5 F (36.9 C) 98.2 F (36.8 C)  TempSrc: Oral Oral Oral Oral  SpO2: 100% 100% 90% 97%  Weight:       Height:  Intake/Output Summary (Last 24 hours) at 03/04/2022 1415 Last data filed at 03/04/2022 0800 Gross per 24 hour  Intake 460 ml  Output 0 ml  Net 460 ml   Filed Weights   02/24/22 2049  Weight: 116.1 kg    Examination:  General exam: Obese chronically ill female sitting up in bed, AAOx3, no distress CVS: S1-S2, regular rhythm Lungs: Clear bilaterally Abdomen: Soft, obese, large abdominal pannus with multiple tender nodular areas and superficial skin breakdown with areas of hyperpigmentation Extremities: Bilateral AKA Skin: Deep sacral decubitus wound over right ischium with minimal serous drainage, abdominal wall superficial ulcers as noted above Psychiatry:  Mood & affect appropriate.     Data Reviewed:   CBC: Recent Labs  Lab 02/27/22 0422 03/01/22 1255 03/03/22 1708  WBC 12.2* 14.3* 11.6*  HGB 9.5* 7.8* 8.1*  HCT 32.2* 25.7* 26.7*  MCV 98.5 97.0 96.4  PLT 279 261 128   Basic Metabolic Panel: Recent Labs  Lab 02/27/22 0422 03/01/22 1255 03/03/22 1708  NA 137 137 138  K 3.6 4.7 4.2  CL 94* 94* 98  CO2 _0 GLUCOSE 121* 125* 110*  BUN 19 46* 33*  CREATININE 5.68* 10.60* 8.13*  CALCIUM 9.5 10.0 9.5  PHOS  --  5.3*  --    GFR: Estimated Creatinine Clearance: 10.4 mL/min (A) (by C-G formula based on SCr of 8.13 mg/dL (H)). Liver Function Tests: Recent Labs  Lab 02/27/22 0422 03/01/22 1255  AST 17  --   ALT 11  --   ALKPHOS 66  --   BILITOT 0.3  --   PROT 8.3*  --   ALBUMIN 3.0* 2.5*   No results for input(s): "LIPASE", "AMYLASE" in the last 168 hours. No results for input(s): "AMMONIA" in the last 168 hours. Coagulation Profile: No results for input(s): "INR", "PROTIME" in the last 168 hours. Cardiac Enzymes: No results for input(s): "CKTOTAL", "CKMB", "CKMBINDEX", "TROPONINI" in the last 168 hours. BNP (last 3 results) No results for input(s): "PROBNP" in the last 8760 hours. HbA1C: No results for input(s): "HGBA1C" in  the last 72 hours. CBG: Recent Labs  Lab 03/01/22 1633 03/01/22 1701  GLUCAP 87 109*    Lipid Profile: No results for input(s): "CHOL", "HDL", "LDLCALC", "TRIG", "CHOLHDL", "LDLDIRECT" in the last 72 hours. Thyroid Function Tests: No results for input(s): "TSH", "T4TOTAL", "FREET4", "T3FREE", "THYROIDAB" in the last 72 hours. Anemia Panel: No results for input(s): "VITAMINB12", "FOLATE", "FERRITIN", "TIBC", "IRON", "RETICCTPCT" in the last 72 hours. Urine analysis: No results found for: "COLORURINE", "APPEARANCEUR", "LABSPEC", "PHURINE", "GLUCOSEU", "HGBUR", "BILIRUBINUR", "KETONESUR", "PROTEINUR", "UROBILINOGEN", "NITRITE", "LEUKOCYTESUR" Sepsis Labs: _1 (procalcitonin:4,lacticidven:4)  )No results found for this or any previous visit (from the past 240 hour(s)).   Radiology Studies: No results found.   Scheduled Meds:  aspirin EC  81 mg Oral Daily   atorvastatin  10 mg Oral Daily   Chlorhexidine Gluconate Cloth  6 each Topical Q0600   darbepoetin (ARANESP) injection - DIALYSIS  200 mcg Subcutaneous Q Tue-1800   docusate sodium  100 mg Oral BID   famotidine  20 mg Oral Daily   feeding supplement  1 Container Oral TID BM   gabapentin  100 mg Oral Q1200   gabapentin  200 mg Oral QHS   Gerhardt's butt cream   Topical BID   lactulose  10 g Oral Daily   levothyroxine  100 mcg Oral Q0600   loratadine  10 mg Oral Daily   melatonin  3 mg Oral QHS  midodrine  10 mg Oral Once per day on Tue Thu Sat   pantoprazole  40 mg Oral Daily   sevelamer carbonate  4.8 g Oral TID WC   Continuous Infusions:  sodium thiosulfate 12.5 g in sodium chloride 0.9 % 150 mL Infusion for Calciphylaxis        LOS: 123 days    Time spent: 11mn    PDomenic Polite MD [facility]   03/04/2022, 2:15 PM

## 2022-03-04 NOTE — Consult Note (Signed)
[place] Nurse wound follow up [place] message was sent yesterday at 1658 that stated, "[name] did not tolerate the medahoney, she started crying after application to wound then requested me to wash it off and use the previous type dressing which was xeroform."  This was from her primary nurse. Today, I discontinued the Medihoney and implemented an order for the preferred dressing. [place] nurse will not follow at this time.  Please re-consult the [place] team if needed.  Val Riles, RN, MSN, CWOCN, CNS-BC, pager [phone]

## 2022-03-05 ENCOUNTER — Other Ambulatory Visit (HOSPITAL_COMMUNITY): Payer: Self-pay

## 2022-03-05 DIAGNOSIS — I5023 Acute on chronic systolic (congestive) heart failure: Secondary | ICD-10-CM | POA: Diagnosis not present

## 2022-03-05 MED ORDER — HEPARIN SODIUM (PORCINE) 1000 UNIT/ML IJ SOLN
INTRAMUSCULAR | Status: AC
  Administered 2022-03-06: 2.1 [IU]
  Filled 2022-03-05: qty 5

## 2022-03-05 MED ORDER — LOPERAMIDE HCL 2 MG PO CAPS
2.0000 mg | ORAL_CAPSULE | ORAL | Status: DC | PRN
  Administered 2022-03-05: 2 mg via ORAL
  Filled 2022-03-05: qty 1

## 2022-03-05 MED ORDER — HYDROQUINONE 4 % EX CREA
TOPICAL_CREAM | Freq: Two times a day (BID) | CUTANEOUS | 0 refills | Status: DC
  Filled 2022-03-05: qty 28.35, 30d supply, fill #0

## 2022-03-05 MED ORDER — HYDROQUINONE 4 % EX CREA: TOPICAL_CREAM | Freq: Two times a day (BID) | CUTANEOUS | Status: DC

## 2022-03-05 NOTE — Progress Notes (Signed)
[facility] Progress Note   Subjective:   Patient seen and examined at bedside.  Feeling tired today which she contributes to antibiotics and Claritin.  Happy that ID has a therapy to potentially help hyperpigmentation on her face. No other new complaints.   Objective Vitals:   03/04/22 1700 03/04/22 2142 03/05/22 0627 03/05/22 0940  BP: (!) 120/42 104/76 (!) 152/60 (!) 123/57  Pulse: 63 74 63 77  Resp: _0 Temp: 98.3 F (36.8 C) 98.1 F (36.7 C) 98 F (36.7 C)   TempSrc: Oral Oral    SpO2: 96% 93%  97%  Weight:      Height:       Physical Exam General:well appearing female in NAD Heart:RRR, no mrg Lungs:CTAB, nml WOB on 2L Abdomen:soft, NTND Extremities:b/l AKA Dialysis Access: [facility]   Filed Weights   02/24/22 2049  Weight: 116.1 kg    Intake/Output Summary (Last 24 hours) at 03/05/2022 1438 Last data filed at 03/05/2022 7414 Gross per 24 hour  Intake 220 ml  Output 0 ml  Net 220 ml    Additional Objective Labs: Basic Metabolic Panel: Recent Labs  Lab 02/27/22 0422 03/01/22 1255 03/03/22 1708  NA 137 137 138  K 3.6 4.7 4.2  CL 94* 94* 98  CO2 _1 GLUCOSE 121* 125* 110*  BUN 19 46* 33*  CREATININE 5.68* 10.60* 8.13*  CALCIUM 9.5 10.0 9.5  PHOS  --  5.3*  --    Liver Function Tests: Recent Labs  Lab 02/27/22 0422 03/01/22 1255  AST 17  --   ALT 11  --   ALKPHOS 66  --   BILITOT 0.3  --   PROT 8.3*  --   ALBUMIN 3.0* 2.5*    CBC: Recent Labs  Lab 02/27/22 0422 03/01/22 1255 03/03/22 1708  WBC 12.2* 14.3* 11.6*  HGB 9.5* 7.8* 8.1*  HCT 32.2* 25.7* 26.7*  MCV 98.5 97.0 96.4  PLT 279 261 259   CBG: Recent Labs  Lab 03/01/22 1633 03/01/22 1701  GLUCAP 87 109*    Medications:  sodium thiosulfate 12.5 g in sodium chloride 0.9 % 150 mL Infusion for Calciphylaxis      aspirin EC  81 mg Oral Daily   atorvastatin  10 mg Oral Daily   Chlorhexidine Gluconate Cloth  6 each Topical Q0600   darbepoetin  (ARANESP) injection - DIALYSIS  200 mcg Subcutaneous Q Tue-1800   docusate sodium  100 mg Oral BID   doxycycline  100 mg Oral BID WC   famotidine  20 mg Oral Daily   feeding supplement  1 Container Oral TID BM   gabapentin  100 mg Oral Q1200   gabapentin  200 mg Oral QHS   Gerhardt's butt cream   Topical BID   lactulose  10 g Oral Daily   levothyroxine  100 mcg Oral Q0600   loratadine  10 mg Oral Daily   melatonin  3 mg Oral QHS   midodrine  10 mg Oral Once per day on Tue Thu Sat   pantoprazole  40 mg Oral Daily   sevelamer carbonate  4.8 g Oral TID WC    Dialysis Orders: Inability to obtain HD at OP center D/T body habitus and wound. Difficult situation with no clear answers yet. PT working with pt -  met with seating specialist to get evaluated for custom chair/cushion that will allow her sit for longer periods.  Appreciate efforts from PT team.  ESRD: Usual TTS schedule. New TDC placed 02/04/2022.  Clotted last week, increased heparin dose.  Refused HD yesterday and again so far today.  Continue to discuss importance of compliance with dialysis.  Painful nodular lesions in abdomen: Initially felt just d/t subq heparin injections, but now worsening.  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 Surgery will not be able to do biopsy any time soon. Started empirically on sodium thiosulfate, but caused vomiting so stopped. Not on any calcium based products or vitamin D. Agreed to try 1/2 dose Na thiosulfate with HD ~ 12.5g, will titrate up if tolerated.  Neck rash - etiology unclear.  ?contact dermatitis. PMD made aware.  HypoTN/volume- On midodrine 56m qHD, with 185mprn mid HD w/parameters to keep SBP >100 d/t becoming symptomatic when it drops lower.  Significant weight loss during admission.  Remains euvolemic on exam.  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 Limiting her, noted to be slow healing. Goal to tolerate sitting upright - which she is doing with PT.  ID  started doxycycline.  COPD history of hypoxia on [place] O2 at home (was on 5 L at baseline has been able to taper to 2-3 L [place]).  Anemia of ESRD - last Hgb 8.1,  on max dose Aranesp 20076mweekly. Recent Fe load completed.  Secondary hyperparathyroidism: Phos at goal - continue Renvela 4.8g AC TID. CorrCa elevated. Not on VDRA, last PTH 60 on 01/30/22. Avoid all calcium based medications for Vitamin D d/t concern for calciphylaxis.  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 Nutrition - diet continues to be changed from renal.  Currently on regular diet.  Should stay on renal diet - she is considering changing back.  Disposition: Difficult situation. Prior to admit, patient required 6 staff members and 2 hoyer pads for transfer into HD chair as OP. Could not stay on HD for full tx. 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  The main issue is her Rt ischial wound. PT has arranged seating specialist with plan for custom power chair/cushion that would keep pressure off wounds and allow her to sit for longer periods  LinJen MowA-C [place]/29/2023,2:38 PM  LOS: 124 days

## 2022-03-05 NOTE — Progress Notes (Signed)
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,  -11/28: Started on oral doxycycline per ID   Subjective: -Concerned about her ulcers, started on oral doxycycline yesterday  Assessment and Plan:  Volume overload End stage renal disease ([place]) -See discussion above,  -Has not been successful in chair dialysis, limited by deep decubitus wound -TOC team following, without accepting outpatient dialysis center at this time -Complicated by large sacral decubitus wound and bilateral AKA  Abdominal ulcers, calciphylaxis Tender nodules, in abdominal fat -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, empirically treated with sodium thiosulfate on HD 11/18 -Then discontinued on account of persistent nausea and vomiting -Now on oxycodone, continue laxatives as needed -Seen by general surgery, they were unable to biopsy her  anytime soon -Reattempted sodium thiosulfate at half dose yesterday  Deep sacral decubitus wound Early osteomyelitis of right ischium -Extremely deep wound, does not appear overtly infected at this time, minimal serous drainage, [facility] RN following -CT abdomen pelvis 11/13 with some signs of osteomyelitis near right ischium, clinically no signs or symptoms of infection, suspect this could be chronic osteomyelitis -ESR is 96, CRP 13, ID consulted 11/22 -Appreciate ID input, started on oral doxycycline 11/28  Anemia of chronic kidney disease, Downtrend in hemoglobin noted Continue with EPO and Iron supplementation.  -Hemoglobin is stable  Hyperkalemia, hypomagnesemia, hypokalemia, hyponatremia, hypophosphatemia -corrected with renal replacement therapy.   Hypertension -On midodrine with HD Currently off antihypertensive medications.   History of IBS -Intermittent constipation, resolved after lactulose -Lactulose dose decreased  Type 2 diabetes mellitus with hyperlipidemia ([place]) -Stable  Bacteremia Patient has completed antibiotic therapy. Bacteroides, splenic abscess.   PVD (peripheral vascular disease) (HCC) Bilateral AKA.   COPD -Stable, on home O2 2 L  Class 3 obesity (HCC) Calculated BMI is 65,3   Acute on chronic diastolic CHF (congestive heart failure) (HCC) Volume regulation with ultrafiltration in hemodialysis Continue midodrine on dialysis days  DVT prophylaxis: Heparin subcutaneous Code Status: Full code Family Communication: No family at bedside Disposition Plan: Unknown  Consultants: Nephrology   Procedures:   Antimicrobials:    Objective: Vitals:   03/04/22 1700 03/04/22 2142 03/05/22 0627 03/05/22 0940  BP: (!) 120/42 104/76 (!) 152/60 (!) 123/57  Pulse: 63 74 63 77  Resp: _0 Temp: 98.3 F (36.8 C) 98.1 F (36.7 C) 98 F (36.7 C)   TempSrc: Oral Oral    SpO2: 96% 93%  97%  Weight:      Height:        Intake/Output Summary  (Last  24 hours) at 03/05/2022 1246 Last data filed at 03/05/2022 0634 Gross per 24 hour  Intake 460 ml  Output 0 ml  Net 460 ml   Filed Weights   02/24/22 2049  Weight: 116.1 kg    Examination:  General exam: Obese chronically ill female sitting up in bed, AAOx3, no distress CVS: S1-S2, regular rhythm Lungs: Clear bilaterally Abdomen: Soft, obese, large abdominal pannus with multiple tender nodular areas and superficial skin breakdown with areas of hyperpigmentation Extremities: Bilateral AKA Skin: Deep sacral decubitus wound over right ischium with minimal serous drainage, abdominal wall superficial ulcers as noted above Psychiatry:  Mood & affect appropriate.     Data Reviewed:   CBC: Recent Labs  Lab 02/27/22 0422 03/01/22 1255 03/03/22 1708  WBC 12.2* 14.3* 11.6*  HGB 9.5* 7.8* 8.1*  HCT 32.2* 25.7* 26.7*  MCV 98.5 97.0 96.4  PLT 279 261 259   Basic Metabolic Panel: Recent Labs  Lab 02/27/22 0422 03/01/22 1255 03/03/22 1708  NA 137 137 138  K 3.6 4.7 4.2  CL 94* 94* 98  CO2 26 24 26  GLUCOSE 121* 125* 110*  BUN 19 46* 33*  CREATININE 5.68* 10.60* 8.13*  CALCIUM 9.5 10.0 9.5  PHOS  --  5.3*  --    GFR: Estimated Creatinine Clearance: 10.4 mL/min (A) (by C-G formula based on SCr of 8.13 mg/dL (H)). Liver Function Tests: Recent Labs  Lab 02/27/22 0422 03/01/22 1255  AST 17  --   ALT 11  --   ALKPHOS 66  --   BILITOT 0.3  --   PROT 8.3*  --   ALBUMIN 3.0* 2.5*   No results for input(s): "LIPASE", "AMYLASE" in the last 168 hours. No results for input(s): "AMMONIA" in the last 168 hours. Coagulation Profile: No results for input(s): "INR", "PROTIME" in the last 168 hours. Cardiac Enzymes: No results for input(s): "CKTOTAL", "CKMB", "CKMBINDEX", "TROPONINI" in the last 168 hours. BNP (last 3 results) No results for input(s): "PROBNP" in the last 8760 hours. HbA1C: No results for input(s): "HGBA1C" in the last 72 hours. CBG: Recent Labs  Lab  03/01/22 1633 03/01/22 1701  GLUCAP 87 109*    Lipid Profile: No results for input(s): "CHOL", "HDL", "LDLCALC", "TRIG", "CHOLHDL", "LDLDIRECT" in the last 72 hours. Thyroid Function Tests: No results for input(s): "TSH", "T4TOTAL", "FREET4", "T3FREE", "THYROIDAB" in the last 72 hours. Anemia Panel: No results for input(s): "VITAMINB12", "FOLATE", "FERRITIN", "TIBC", "IRON", "RETICCTPCT" in the last 72 hours. Urine analysis: No results found for: "COLORURINE", "APPEARANCEUR", "LABSPEC", "PHURINE", "GLUCOSEU", "HGBUR", "BILIRUBINUR", "KETONESUR", "PROTEINUR", "UROBILINOGEN", "NITRITE", "LEUKOCYTESUR" Sepsis Labs: @LABRCNTIP(procalcitonin:4,lacticidven:4)  )No results found for this or any previous visit (from the past 240 hour(s)).   Radiology Studies: No results found.   Scheduled Meds:  aspirin EC  81 mg Oral Daily   atorvastatin  10 mg Oral Daily   Chlorhexidine Gluconate Cloth  6 each Topical Q0600   darbepoetin (ARANESP) injection - DIALYSIS  200 mcg Subcutaneous Q Tue-1800   docusate sodium  100 mg Oral BID   doxycycline  100 mg Oral BID WC   famotidine  20 mg Oral Daily   feeding supplement  1 Container Oral TID BM   gabapentin  100 mg Oral Q1200   gabapentin  200 mg Oral QHS   Gerhardt's butt cream   Topical BID   lactulose  10 g Oral Daily   levothyroxine  100 mcg Oral Q0600   loratadine  10 mg Oral Daily     melatonin  3 mg Oral QHS   midodrine  10 mg Oral Once per day on Tue Thu Sat   pantoprazole  40 mg Oral Daily   sevelamer carbonate  4.8 g Oral TID WC   Continuous Infusions:  sodium thiosulfate 12.5 g in sodium chloride 0.9 % 150 mL Infusion for Calciphylaxis        LOS: 124 days    Time spent: 35min     , MD [facility]   03/05/2022, 12:46 PM    

## 2022-03-06 DIAGNOSIS — I5023 Acute on chronic systolic (congestive) heart failure: Secondary | ICD-10-CM | POA: Diagnosis not present

## 2022-03-06 MED ORDER — HEPARIN SODIUM (PORCINE) 1000 UNIT/ML IJ SOLN
INTRAMUSCULAR | Status: AC
  Administered 2022-03-05: 5000 [IU] via INTRA_ARTERIAL
  Filled 2022-03-06: qty 5

## 2022-03-06 MED ORDER — LACTULOSE 10 GM/15ML PO SOLN
20.0000 g | Freq: Once | ORAL | Status: AC
  Administered 2022-03-06: 20 g via ORAL
  Filled 2022-03-06: qty 30

## 2022-03-06 MED ORDER — HEPARIN SODIUM (PORCINE) 1000 UNIT/ML IJ SOLN
INTRAMUSCULAR | Status: AC
  Filled 2022-03-06: qty 4

## 2022-03-06 NOTE — Progress Notes (Signed)
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  He was taking for staff members and however left to get patient seated in a dialysis chair.  Subjective / 24h Interval events: Not very interactive this morning, complains of feeling quite tired, HD was late last night and finished around 3 AM but treatment was not completed since HD catheter stopped working  Assesement and Plan: Principal Problem:   Acute on chronic systolic CHF (congestive heart failure) (HCC) Active Problems:   End stage renal disease (HCC)   Hypertension   Decubitus ulcer of sacral region, stage 3 (HCC)   Type 2 diabetes mellitus with hyperlipidemia (HCC)   Chronic respiratory failure with hypoxia (HCC)   Bacteremia   PVD (peripheral vascular disease) (HCC)   Class 3 obesity (HCC)   Anemia due to chronic kidney disease   Acute on chronic diastolic CHF (congestive heart failure) (HCC)   Hypertensive emergency   Leukocytosis   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HCC)   Pressure injury of skin   PAD (peripheral artery disease) (HCC)   Irritable bowel syndrome (IBS)   Amputation of left middle finger   GERD (gastroesophageal reflux disease)   Nodule of skin of abdomen   Principal problem End stage renal disease with volume overload-nephrology consulted and following.  Currently undergoing HD, limitations are by deep decubitus wounds, unable to tolerate HD in a  chair and she is essentially bedbound now.  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  Now that she is better, sodium thiosulfate was attempted again at half dose with dialysis, seems to have tolerated.  Deep sacral decubitus wound, early osteomyelitis of right ischium -Extremely deep wound, does not appear overtly infected at this time, minimal serous drainage, [facility] RN following. CT abdomen pelvis 11/13 with some signs of osteomyelitis near right ischium, clinically no signs or symptoms of infection, suspect this could be chronic osteomyelitis.  ID consulted, currently on doxycycline   Anemia of chronic kidney disease -hemoglobin overall stable, continue with EPO and Iron supplementation.    Hyperkalemia, hypomagnesemia, hypokalemia, hyponatremia, hypophosphatemia -corrected with renal replacement therapy.    Hypertension / hypotension -On midodrine with HD. Currently off antihypertensive medications.    History of IBS -Intermittent constipation, resolved after lactulose  Type 2 diabetes mellitus with hyperlipidemia (HCC) -Stable  PVD (peripheral vascular disease) (HCC) -Bilateral AKA.    COPD -Stable, on home O2 2 L   Class 3 obesity (HCC) -Calculated BMI is 65,3    Acute on chronic diastolic CHF (congestive heart failure) (HCC) -Volume regulation with HD. Continue midodrine on dialysis days  Scheduled Meds:  aspirin EC  81 mg Oral Daily   atorvastatin  10 mg Oral Daily   Chlorhexidine Gluconate Cloth  6 each Topical Q0600   darbepoetin (ARANESP) injection -  DIALYSIS  200 mcg Subcutaneous Q Tue-1800   docusate sodium  100 mg Oral BID   doxycycline  100 mg Oral BID WC   famotidine  20 mg Oral Daily    feeding supplement  1 Container Oral TID BM   gabapentin  100 mg Oral Q1200   gabapentin  200 mg Oral QHS   Gerhardt's butt cream   Topical BID   heparin sodium (porcine)       hydroquinone   Topical BID   lactulose  10 g Oral Daily   levothyroxine  100 mcg Oral Q0600   loratadine  10 mg Oral Daily   melatonin  3 mg Oral QHS   midodrine  10 mg Oral Once per day on Tue Thu Sat   pantoprazole  40 mg Oral Daily   sevelamer carbonate  4.8 g Oral TID WC   Continuous Infusions:  sodium thiosulfate 12.5 g in sodium chloride 0.9 % 150 mL Infusion for Calciphylaxis     PRN Meds:.acetaminophen, heparin sodium (porcine), HYDROmorphone, loperamide, mouth rinse, oxyCODONE, polyethylene glycol, simethicone  Current Outpatient Medications  Medication Instructions   acetaminophen (TYLENOL) 1,000 mg, Oral, As needed   Amino Acids-Protein Hydrolys (PRO-STAT RENAL CARE) LIQD 30 mLs, Oral, 2 times daily   ascorbic acid (VITAMIN C) 500 mg, Oral, Daily   atorvastatin (LIPITOR) 10 mg, Oral, Daily   bisacodyl (FLEET) 10 mg, Rectal, Every 48 hours PRN   Cholecalciferol 1,000 Units, Oral, Daily   collagenase (SANTYL) 250 UNIT/GM ointment 1 Application, Topical, Daily, Apply to right gluteal fold topically every day shift for wound healing.   diphenhydrAMINE (BENADRYL) 25 mg, Oral, 2 times daily PRN   docusate sodium (COLACE) 100 mg, Oral, 2 times daily   doxycycline (VIBRAMYCIN) 100 mg, Oral, 2 times daily   gabapentin (NEURONTIN) 100 mg, Oral, 2 times daily   hydroquinone 4 % cream Topical, 2 times daily   levothyroxine (SYNTHROID) 100 mcg, Oral, Daily   loperamide (IMODIUM A-D) 4 mg, Oral, As needed   melatonin 3 mg, Oral, Daily at bedtime   nitroGLYCERIN (NITROSTAT) 0.4 mg, Sublingual, Every 5 min PRN   oxyCODONE (OXY IR/ROXICODONE) 5-10 mg, Oral, Every 4 hours PRN   OXYGEN 4 L, Inhalation, See admin instructions, Oxygen at 4L hr via nasal cannula and will use portable oxygen machine when up in  wheelchair.   pantoprazole (PROTONIX) 40 mg, Oral, Daily   senna (SENOKOT) 8.6 MG tablet 2 tablets, Oral, 2 times daily   simethicone (MYLICON) 229 mg, Oral, 2 times daily   tiZANidine (ZANAFLEX) 4 mg, Oral, 2 times daily   Tums Smoothies 1,500 mg, Oral, See admin instructions, Take 1500 mg with each meal and 1500 mg with each snack    Diet Orders (From admission, onward)     Start     Ordered   02/05/22 0811  Diet regular Room service appropriate? Yes; Fluid consistency: Thin  Diet effective now       Question Answer Comment  Room service appropriate? Yes   Fluid consistency: Thin      02/05/22 0810            DVT prophylaxis:    Lab Results  Component Value Date   PLT 259 03/03/2022      Code Status: Full Code  Family Communication: no family at bedside  Status is: Inpatient  Remains inpatient appropriate because: severity of illness  Level of care: Med-Surg  Consultants:  Neprhology  Objective: Vitals:   03/06/22 0224  03/06/22 0239 03/06/22 0500 03/06/22 0851  BP: 121/71 (!) 117/49 (!) 143/53 (!) 129/43  Pulse: 98 78 70 68  Resp: 11 20 17 16   Temp:  (!) 97.5 F (36.4 C) 98.2 F (36.8 C) 98.4 F (36.9 C)  TempSrc:   Oral Oral  SpO2: 99% 100% 100% 98%  Weight:      Height:        Intake/Output Summary (Last 24 hours) at 03/06/2022 1141 Last data filed at 03/06/2022 0900 Gross per 24 hour  Intake 220 ml  Output 300 ml  Net -80 ml   Wt Readings from Last 3 Encounters:  10/21/21 (!) 144 kg  09/03/21 (!) 150.6 kg  08/28/21 (!) 150.1 kg    Examination:  Constitutional: Appears uncomfortable Eyes: no scleral icterus ENMT: Mucous membranes are moist.  Neck: normal, supple Respiratory: clear to auscultation bilaterally, no wheezing, no crackles. Normal respiratory effort.  Cardiovascular: Regular rate and rhythm, no murmurs / rubs / gallops. Abdomen: non distended, no tenderness. Bowel sounds positive.  Musculoskeletal: no clubbing /  cyanosis.   Data Reviewed: I have independently reviewed following labs and imaging studies   CBC Recent Labs  Lab 03/01/22 1255 03/03/22 1708  WBC 14.3* 11.6*  HGB 7.8* 8.1*  HCT 25.7* 26.7*  PLT 261 259  MCV 97.0 96.4  MCH 29.4 29.2  MCHC 30.4 30.3  RDW 14.8 14.6    Recent Labs  Lab 03/01/22 1255 03/03/22 1708  NA 137 138  K 4.7 4.2  CL 94* 98  CO2 24 26  GLUCOSE 125* 110*  BUN 46* 33*  CREATININE 10.60* 8.13*  CALCIUM 10.0 9.5  ALBUMIN 2.5*  --     ------------------------------------------------------------------------------------------------------------------ No results for input(s): "CHOL", "HDL", "LDLCALC", "TRIG", "CHOLHDL", "LDLDIRECT" in the last 72 hours.  Lab Results  Component Value Date   HGBA1C 5.3 12/19/2021   ------------------------------------------------------------------------------------------------------------------ No results for input(s): "TSH", "T4TOTAL", "T3FREE", "THYROIDAB" in the last 72 hours.  Invalid input(s): "FREET3"  Cardiac Enzymes No results for input(s): "CKMB", "TROPONINI", "MYOGLOBIN" in the last 168 hours.  Invalid input(s): "CK" ------------------------------------------------------------------------------------------------------------------    Component Value Date/Time   BNP 1,414.1 (H) 10/27/2021 0016    CBG: Recent Labs  Lab 03/01/22 1633 03/01/22 1701  GLUCAP 87 109*    No results found for this or any previous visit (from the past 240 hour(s)).   Radiology Studies: No results found.   Marzetta Board, MD, PhD [facility]  Between 7 am - 7 pm I am available, please contact me via Amion (for emergencies) or Securechat (non urgent messages)  Between 7 pm - 7 am I am not available, please contact night coverage MD/APP via Amion

## 2022-03-06 NOTE — Progress Notes (Signed)
ID PROGRESS NOTE  Tolerating doxycycline for the time being Does not want to use hydroquinone cream for hyperpigmentation   A/P: right ischial ulcer with associated osteomyelitis - continue on doxycycline since hesistant for any other abtx use  Courtney Gray B. [place] [facility] [phone]

## 2022-03-06 NOTE — Progress Notes (Signed)
HD nurse called for report on pt to bring her up to unit for her scheduled HD tx. We didn't call for her first rounds as we know she will not come until after she has eaten breakfast, so we saved her for second round. Nurse called for report at 1325 and pt again is refusing to come to HD b/c she hasn't eaten lunch yet. Lunch was delivered to the unit approximately 1130. Yet, at 1325 she had still not eaten. She then stated she would not come until she had eaten lunch. Pt was moved to night shift in hopes she would finally come for her HD tx

## 2022-03-06 NOTE — Progress Notes (Signed)
Received patient in bed to unit.  Alert and oriented.  Informed consent signed and in chart.   Treatment initiated: 2340 Treatment completed: 0252  HD tx not completed as scheduled , HD catheter stopped working.  Transported back to the room  Alert, without acute distress.  Hand-off given to patient's nurse.   Access used: catheter Access issues: stopped being functional  Total UF removed: 300 Medication(s) given: none Post HD VS: 98.2 143/53 70 17  Post HD weight: unable to obtain.   [facility]

## 2022-03-06 NOTE — Progress Notes (Signed)
[facility] Progress Note   Subjective:   Patient seen and examined at bedside. Reports diarrhea due to antibiotics.  Continues to have pain.  HD catheter malfunctioned during dialysis yesterday and per nurse report needs to have it exchanged.  Patient is unsure if she could tolerate  Objective Vitals:   03/06/22 0224 03/06/22 0239 03/06/22 0500 03/06/22 0851  BP: 121/71 (!) 117/49 (!) 143/53 (!) 129/43  Pulse: 98 78 70 68  Resp: _0 Temp:  (!) 97.5 F (36.4 C) 98.2 F (36.8 C) 98.4 F (36.9 C)  TempSrc:   Oral Oral  SpO2: 99% 100% 100% 98%  Weight:      Height:       Physical Exam General:ill appearing female in NAD Heart:RRR, no mrg Lungs:nml WOB on RA Abdomen:soft, ND Extremities:b/l AKA Dialysis Access: [facility]   Filed Weights   02/24/22 2049  Weight: 116.1 kg    Intake/Output Summary (Last 24 hours) at 03/06/2022 1614 Last data filed at 03/06/2022 1300 Gross per 24 hour  Intake 440 ml  Output 300 ml  Net 140 ml    Additional Objective Labs: Basic Metabolic Panel: Recent Labs  Lab 03/01/22 1255 03/03/22 1708  NA 137 138  K 4.7 4.2  CL 94* 98  CO2 24 26  GLUCOSE 125* 110*  BUN 46* 33*  CREATININE 10.60* 8.13*  CALCIUM 10.0 9.5  PHOS 5.3*  --    Liver Function Tests: Recent Labs  Lab 03/01/22 1255  ALBUMIN 2.5*   CBC: Recent Labs  Lab 03/01/22 1255 03/03/22 1708  WBC 14.3* 11.6*  HGB 7.8* 8.1*  HCT 25.7* 26.7*  MCV 97.0 96.4  PLT 261 259    CBG: Recent Labs  Lab 03/01/22 1633 03/01/22 1701  GLUCAP 87 109*    Medications:  sodium thiosulfate 12.5 g in sodium chloride 0.9 % 150 mL Infusion for Calciphylaxis      aspirin EC  81 mg Oral Daily   atorvastatin  10 mg Oral Daily   Chlorhexidine Gluconate Cloth  6 each Topical Q0600   darbepoetin (ARANESP) injection - DIALYSIS  200 mcg Subcutaneous Q Tue-1800   docusate sodium  100 mg Oral BID   doxycycline  100 mg Oral BID WC   famotidine  20 mg Oral Daily    feeding supplement  1 Container Oral TID BM   gabapentin  100 mg Oral Q1200   gabapentin  200 mg Oral QHS   Gerhardt's butt cream   Topical BID   heparin sodium (porcine)       hydroquinone   Topical BID   lactulose  10 g Oral Daily   levothyroxine  100 mcg Oral Q0600   loratadine  10 mg Oral Daily   melatonin  3 mg Oral QHS   midodrine  10 mg Oral Once per day on Tue Thu Sat   pantoprazole  40 mg Oral Daily   sevelamer carbonate  4.8 g Oral TID WC    Assessment/Plan: Inability to obtain HD at OP center D/T body habitus and wound. Difficult situation with no clear answers yet. PT working with pt -  met with seating specialist to get evaluated for custom chair/cushion that will allow her sit for longer periods.  Appreciate efforts from PT team.  ESRD: Usual TTS schedule. Continues to refuse to come, discussed importance of compliance. New TDC placed 02/04/2022.  Clotted last week, increased heparin dose.  TDC stopped working this AM. Put in order to  have it exchanged, patient does not like the idea of having to get a new one again.  Asked IV team to flush [facility] to see if it pushes/pulls - may be able to try again before getting new TDC.  Painful nodular lesions in abdomen: Initially felt just d/t subq heparin injections, but now worsening.  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 Surgery will not be able to do biopsy any time soon. Started empirically on sodium thiosulfate, but caused vomiting so stopped. Not on any calcium based products or vitamin D. Agreed to try 1/2 dose Na thiosulfate with HD ~ 12.5g, will titrate up if tolerated -has not received yet d/t non working [facility] this AM.  Neck rash - etiology unclear.  ?contact dermatitis. PMD made aware.  HypoTN/volume- On midodrine 75m qHD, with 158mprn mid HD w/parameters to keep SBP >100 d/t becoming symptomatic when it drops lower.  Significant weight loss during admission.  Remains euvolemic on exam.  Severe ischial sacral decubitus wound: CT shows  likely chronic osteomyelitis. Limiting her, noted to be slow healing. Goal to tolerate sitting upright - which she is doing with PT.  ID started doxycycline - causing diarrhea.  COPD history of hypoxia on [place] O2 at home (was on 5 L at baseline has been able to taper to 2-3 L ).  Anemia of ESRD - last Hgb 8.1,  on max dose Aranesp 20031mweekly. Recent Fe load completed.  Secondary hyperparathyroidism: Phos at goal - continue Renvela 4.8g AC TID. CorrCa elevated. Not on VDRA, last PTH 60 on 01/30/22. Avoid all calcium based medications for Vitamin D d/t concern for calciphylaxis.  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 Nutrition - diet continues to be changed from renal.  Currently on regular diet.  Should stay on renal diet - she is considering changing back.  Disposition: Difficult situation. Prior to admit, patient required 6 staff members and 2 hoyer pads for transfer into HD chair as OP. Could not stay on HD for full tx. 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  The main issue is her Rt ischial wound. PT has arranged seating specialist with plan for custom power chair/cushion that would keep pressure off wounds and allow her to sit for longer periods  LinJen MowA-C [place]/30/2023,4:14 PM  LOS: 125 days

## 2022-03-06 NOTE — Progress Notes (Addendum)
PT Cancellation Note  Patient Details Name: Courtney Gray MRN: [id number] DOB: 11-19-1970   Cancelled Treatment:    Reason Eval/Treat Not Completed: patient declined. Patient with multiple excuses to not work with therapy today. Spent an extended period of time speaking with patient and offering various therapeutic activity options to assist patient with. She ultimately felt/decided it would be best to not participate today due to the poor timing of therapy visiting. When asked how we could better prepare a schedule for her to participate in the future she states that we can provide advanced notice and see her between 2-3pm. This visit occurred between these times . Also gently explained that we will try to meet these requests but cannot always guarantee priority in a hospital setting.  Candie Mile, PT, DPT Physical Therapist [facility]   03/06/2022, 2:34 PM

## 2022-03-07 ENCOUNTER — Encounter (HOSPITAL_COMMUNITY): Payer: Self-pay | Admitting: Internal Medicine

## 2022-03-07 ENCOUNTER — Inpatient Hospital Stay (HOSPITAL_COMMUNITY): Payer: 59

## 2022-03-07 DIAGNOSIS — I5023 Acute on chronic systolic (congestive) heart failure: Secondary | ICD-10-CM | POA: Diagnosis not present

## 2022-03-07 HISTORY — PX: IR FLUORO GUIDE CV LINE LEFT: IMG2282

## 2022-03-07 HISTORY — PX: IR PTA VENOUS EXCEPT DIALYSIS CIRCUIT: IMG6126

## 2022-03-07 HISTORY — PX: IR VENOCAVAGRAM SVC: IMG679

## 2022-03-07 HISTORY — PX: IR INTRAVASCULAR ULTRASOUND NON CORONARY: IMG6085

## 2022-03-07 LAB — RENAL FUNCTION PANEL
Albumin: 2.6 g/dL — ABNORMAL LOW (ref 3.5–5.0)
Anion gap: 13 (ref 5–15)
BUN: 44 mg/dL — ABNORMAL HIGH (ref 6–20)
CO2: 24 mmol/L (ref 22–32)
Calcium: 9.3 mg/dL (ref 8.9–10.3)
Chloride: 100 mmol/L (ref 98–111)
Creatinine, Ser: 10.74 mg/dL — ABNORMAL HIGH (ref 0.44–1.00)
GFR, Estimated: 4 mL/min — ABNORMAL LOW (ref >60)
Glucose, Bld: 84 mg/dL (ref 70–99)
Phosphorus: 5.9 mg/dL — ABNORMAL HIGH (ref 2.5–4.6)
Potassium: 4.9 mmol/L (ref 3.5–5.1)
Sodium: 137 mmol/L (ref 135–145)

## 2022-03-07 LAB — CBC
HCT: 25.5 % — ABNORMAL LOW (ref 36.0–46.0)
Hemoglobin: 7.7 g/dL — ABNORMAL LOW (ref 12.0–15.0)
MCH: 29.1 pg (ref 26.0–34.0)
MCHC: 30.2 g/dL (ref 30.0–36.0)
MCV: 96.2 fL (ref 80.0–100.0)
Platelets: 266 10*3/uL (ref 150–400)
RBC: 2.65 MIL/uL — ABNORMAL LOW (ref 3.87–5.11)
RDW: 15.1 % (ref 11.5–15.5)
WBC: 11.5 10*3/uL — ABNORMAL HIGH (ref 4.0–10.5)
nRBC: 0 % (ref 0.0–0.2)

## 2022-03-07 MED ORDER — MIDAZOLAM HCL 2 MG/2ML IJ SOLN
INTRAMUSCULAR | Status: AC | PRN
  Administered 2022-03-07: 1 mg via INTRAVENOUS
  Administered 2022-03-07: .5 mg via INTRAVENOUS
  Administered 2022-03-07: 1 mg via INTRAVENOUS
  Administered 2022-03-07: .5 mg via INTRAVENOUS

## 2022-03-07 MED ORDER — ALTEPLASE 2 MG IJ SOLR: 2.0000 mg | Freq: Once | INTRAMUSCULAR | Status: DC | PRN

## 2022-03-07 MED ORDER — HEPARIN SODIUM (PORCINE) 1000 UNIT/ML IJ SOLN
INTRAMUSCULAR | Status: AC
  Filled 2022-03-07: qty 10

## 2022-03-07 MED ORDER — VANCOMYCIN HCL IN DEXTROSE 1-5 GM/200ML-% IV SOLN
INTRAVENOUS | Status: AC
  Filled 2022-03-07: qty 200

## 2022-03-07 MED ORDER — HEPARIN SODIUM (PORCINE) 1000 UNIT/ML DIALYSIS
1000.0000 [IU] | INTRAMUSCULAR | Status: DC | PRN
  Administered 2022-03-08: 1000 [IU] via INTRAVENOUS_CENTRAL
  Filled 2022-03-07: qty 1

## 2022-03-07 MED ORDER — MIDAZOLAM HCL 2 MG/2ML IJ SOLN
INTRAMUSCULAR | Status: AC
  Filled 2022-03-07: qty 4

## 2022-03-07 MED ORDER — FENTANYL CITRATE (PF) 100 MCG/2ML IJ SOLN
INTRAMUSCULAR | Status: AC
  Filled 2022-03-07: qty 4

## 2022-03-07 MED ORDER — HEPARIN SODIUM (PORCINE) 1000 UNIT/ML DIALYSIS
5000.0000 [IU] | Freq: Once | INTRAMUSCULAR | Status: AC
  Administered 2022-03-08: 5000 [IU] via INTRAVENOUS_CENTRAL
  Filled 2022-03-07 (×2): qty 5

## 2022-03-07 MED ORDER — LIDOCAINE-EPINEPHRINE 1 %-1:100000 IJ SOLN
INTRAMUSCULAR | Status: AC
  Filled 2022-03-07: qty 1

## 2022-03-07 MED ORDER — LIDOCAINE HCL (PF) 1 % IJ SOLN: 5.0000 mL | INTRAMUSCULAR | Status: DC | PRN

## 2022-03-07 MED ORDER — VANCOMYCIN HCL IN DEXTROSE 1-5 GM/200ML-% IV SOLN
INTRAVENOUS | Status: AC | PRN
  Administered 2022-03-07: 1000 mg via INTRAVENOUS

## 2022-03-07 MED ORDER — IOHEXOL 300 MG/ML  SOLN: 100.0000 mL | Freq: Once | INTRAMUSCULAR | Status: DC | PRN

## 2022-03-07 MED ORDER — LIDOCAINE-PRILOCAINE 2.5-2.5 % EX CREA: 1.0000 | TOPICAL_CREAM | CUTANEOUS | Status: DC | PRN

## 2022-03-07 MED ORDER — VANCOMYCIN HCL IN DEXTROSE 1-5 GM/200ML-% IV SOLN: 1000.0000 mg | Freq: Once | INTRAVENOUS | Status: DC

## 2022-03-07 MED ORDER — PENTAFLUOROPROP-TETRAFLUOROETH EX AERO: 1.0000 | INHALATION_SPRAY | CUTANEOUS | Status: DC | PRN

## 2022-03-07 MED ORDER — FENTANYL CITRATE (PF) 100 MCG/2ML IJ SOLN
INTRAMUSCULAR | Status: AC | PRN
  Administered 2022-03-07 (×6): 25 ug via INTRAVENOUS

## 2022-03-07 NOTE — Progress Notes (Signed)
TRH night cross cover note:   I was notified by patient's RN that the patient is c/o constipation, while noting that the patient had 1 small bowel movement earlier in the day that was associated with very small, hard stool pellets.  Her existing bowel regimen includes scheduled lactulose 10 g p.o. daily.  The patient specifically requests an extra dose of lactulose on top of her daily scheduled lactulose to assist with her constipation.   I subsequently placed order for lactulose 20 g p.o. x1 dose now.    Babs Bertin, DO Hospitalist

## 2022-03-07 NOTE — Progress Notes (Signed)
PROGRESS NOTE  Courtney Gray RUE:[id number] DOB: Nov 03, 1970 DOA: 11/01/2021 PCP: Monico Blitz, MD   LOS: 126 days   Brief Narrative / Interim history: 51 year old female, chronically ill with obesity, bedbound status, ESRD, PVD status post right AKA, chronic hypoxic respiratory failure on home O2, sacral decubitus wounds, recent lengthy hospitalization with bacteremia, hepatic and splenic abscess requiring third digit of left hand amputation and left AKA, now bilateral AKA, splenic drain, liver abscess drain comes into the hospital with progressive dyspnea on exertion in the setting of missed HD.  He was taking for staff members and however left to get patient seated in a dialysis chair.  Subjective / 24h Interval events: Doing well, about to eat lunch, just got back from interventional radiology.  Assesement and Plan: Principal Problem:   Acute on chronic systolic CHF (congestive heart failure) (HCC) Active Problems:   End stage renal disease (HCC)   Hypertension   Decubitus ulcer of sacral region, stage 3 (HCC)   Type 2 diabetes mellitus with hyperlipidemia (HCC)   Chronic respiratory failure with hypoxia (HCC)   Bacteremia   PVD (peripheral vascular disease) (HCC)   Class 3 obesity (HCC)   Anemia due to chronic kidney disease   Acute on chronic diastolic CHF (congestive heart failure) (HCC)   Hypertensive emergency   Leukocytosis   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HCC)   Pressure injury of skin   PAD (peripheral artery disease) (HCC)   Irritable bowel syndrome (IBS)   Amputation of left middle finger   GERD (gastroesophageal reflux disease)   Nodule of skin of abdomen   Principal problem End stage renal disease with volume overload-nephrology consulted and following.  Currently undergoing HD, limitations are by deep decubitus wounds, unable to tolerate HD in a chair and she is essentially bedbound now.  TOC team following, difficulties with finding an outpatient  dialysis center -Appreciate nephrology follow-up -HD catheter malfunction, status post tunneled central catheter exchanged by IR today.  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  Now that she is better, sodium thiosulfate was attempted again at half dose with dialysis, seems to have tolerated.  Deep sacral decubitus wound, early osteomyelitis of right ischium -Extremely deep wound, does not appear overtly infected at this time, minimal serous drainage, [facility] RN following. CT abdomen pelvis 11/13 with some signs of osteomyelitis near right ischium, clinically no signs or symptoms of infection, suspect this could be chronic osteomyelitis.  ID consulted, currently on doxycycline   Anemia of chronic kidney disease -hemoglobin overall stable, continue with EPO and Iron supplementation.    Hyperkalemia, hypomagnesemia, hypokalemia, hyponatremia, hypophosphatemia -corrected with renal replacement therapy.    Hypertension / hypotension -On midodrine with HD. Currently off antihypertensive medications.    History of IBS -Intermittent constipation, resolved after lactulose  Type 2 diabetes mellitus with hyperlipidemia (HCC) -Stable  PVD (peripheral vascular disease) (HCC) -Bilateral AKA.    COPD -Stable, on home O2 2 L   Class 3 obesity (HCC) -Calculated BMI is 65,3    Acute on chronic diastolic CHF (congestive heart failure) (HCC) -Volume regulation with HD. Continue midodrine on dialysis days  Scheduled Meds:  aspirin EC  81 mg Oral Daily   atorvastatin  10 mg Oral Daily   Chlorhexidine Gluconate Cloth  6 each Topical  Q0600   darbepoetin (ARANESP) injection - DIALYSIS  200 mcg Subcutaneous Q Tue-1800   docusate sodium  100 mg Oral BID   doxycycline   100 mg Oral BID WC   famotidine  20 mg Oral Daily   feeding supplement  1 Container Oral TID BM   gabapentin  100 mg Oral Q1200   gabapentin  200 mg Oral QHS   Gerhardt's butt cream   Topical BID   hydroquinone   Topical BID   lactulose  10 g Oral Daily   levothyroxine  100 mcg Oral Q0600   loratadine  10 mg Oral Daily   melatonin  3 mg Oral QHS   midodrine  10 mg Oral Once per day on Tue Thu Sat   pantoprazole  40 mg Oral Daily   sevelamer carbonate  4.8 g Oral TID WC   Continuous Infusions:  sodium thiosulfate 12.5 g in sodium chloride 0.9 % 150 mL Infusion for Calciphylaxis     vancomycin     PRN Meds:.acetaminophen, HYDROmorphone, iohexol, loperamide, mouth rinse, oxyCODONE, polyethylene glycol, simethicone, vancomycin  Current Outpatient Medications  Medication Instructions   acetaminophen (TYLENOL) 1,000 mg, Oral, As needed   Amino Acids-Protein Hydrolys (PRO-STAT RENAL CARE) LIQD 30 mLs, Oral, 2 times daily   ascorbic acid (VITAMIN C) 500 mg, Oral, Daily   atorvastatin (LIPITOR) 10 mg, Oral, Daily   bisacodyl (FLEET) 10 mg, Rectal, Every 48 hours PRN   Cholecalciferol 1,000 Units, Oral, Daily   collagenase (SANTYL) 250 UNIT/GM ointment 1 Application, Topical, Daily, Apply to right gluteal fold topically every day shift for wound healing.   diphenhydrAMINE (BENADRYL) 25 mg, Oral, 2 times daily PRN   docusate sodium (COLACE) 100 mg, Oral, 2 times daily   doxycycline (VIBRAMYCIN) 100 mg, Oral, 2 times daily   gabapentin (NEURONTIN) 100 mg, Oral, 2 times daily   hydroquinone 4 % cream Topical, 2 times daily   levothyroxine (SYNTHROID) 100 mcg, Oral, Daily   loperamide (IMODIUM A-D) 4 mg, Oral, As needed   melatonin 3 mg, Oral, Daily at bedtime   nitroGLYCERIN (NITROSTAT) 0.4 mg, Sublingual, Every 5 min PRN   oxyCODONE (OXY IR/ROXICODONE) 5-10 mg, Oral, Every 4 hours PRN   OXYGEN 4 L, Inhalation, See admin instructions, Oxygen at 4L hr via nasal cannula and will use portable  oxygen machine when up in wheelchair.   pantoprazole (PROTONIX) 40 mg, Oral, Daily   senna (SENOKOT) 8.6 MG tablet 2 tablets, Oral, 2 times daily   simethicone (MYLICON) 786 mg, Oral, 2 times daily   tiZANidine (ZANAFLEX) 4 mg, Oral, 2 times daily   Tums Smoothies 1,500 mg, Oral, See admin instructions, Take 1500 mg with each meal and 1500 mg with each snack    Diet Orders (From admission, onward)     Start     Ordered   03/07/22 1157  Diet regular Room service appropriate? Yes; Fluid consistency: Thin  Diet effective now       Question Answer Comment  Room service appropriate? Yes   Fluid consistency: Thin      03/07/22 1156            DVT prophylaxis:    Lab Results  Component Value Date   PLT 259 03/03/2022      Code Status: Full Code  Family Communication: no family at bedside  Status is: Inpatient  Remains inpatient appropriate because: severity of illness  Level of care: Med-Surg  Consultants:  Neprhology  Objective: Vitals:  03/07/22 1115 03/07/22 1120 03/07/22 1125 03/07/22 1140  BP: (!) 122/55 110/66 108/62 124/72  Pulse: 82 84 80 78  Resp: 20 20 18 20   Temp:      TempSrc:      SpO2: 99% 98% 98% 99%  Height:        Intake/Output Summary (Last 24 hours) at 03/07/2022 1327 Last data filed at 03/07/2022 0858 Gross per 24 hour  Intake 237 ml  Output --  Net 237 ml    Wt Readings from Last 3 Encounters:  10/21/21 (!) 144 kg  09/03/21 (!) 150.6 kg  08/28/21 (!) 150.1 kg    Examination:  Constitutional: NAD Eyes: lids and conjunctivae normal, no scleral icterus ENMT: mmm Neck: normal, supple Respiratory: clear to auscultation bilaterally, no wheezing, no crackles. Normal respiratory effort.  Cardiovascular: Regular rate and rhythm, no murmurs / rubs / gallops. No LE edema. Abdomen: soft, no distention, no tenderness. Bowel sounds positive.  Skin: no rashes  Data Reviewed: I have independently reviewed following labs and imaging  studies   CBC Recent Labs  Lab 03/01/22 1255 03/03/22 1708  WBC 14.3* 11.6*  HGB 7.8* 8.1*  HCT 25.7* 26.7*  PLT 261 259  MCV 97.0 96.4  MCH 29.4 29.2  MCHC 30.4 30.3  RDW 14.8 14.6     Recent Labs  Lab 03/01/22 1255 03/03/22 1708  NA 137 138  K 4.7 4.2  CL 94* 98  CO2 24 26  GLUCOSE 125* 110*  BUN 46* 33*  CREATININE 10.60* 8.13*  CALCIUM 10.0 9.5  ALBUMIN 2.5*  --      ------------------------------------------------------------------------------------------------------------------ No results for input(s): "CHOL", "HDL", "LDLCALC", "TRIG", "CHOLHDL", "LDLDIRECT" in the last 72 hours.  Lab Results  Component Value Date   HGBA1C 5.3 12/19/2021   ------------------------------------------------------------------------------------------------------------------ No results for input(s): "TSH", "T4TOTAL", "T3FREE", "THYROIDAB" in the last 72 hours.  Invalid input(s): "FREET3"  Cardiac Enzymes No results for input(s): "CKMB", "TROPONINI", "MYOGLOBIN" in the last 168 hours.  Invalid input(s): "CK" ------------------------------------------------------------------------------------------------------------------    Component Value Date/Time   BNP 1,414.1 (H) 10/27/2021 0016    CBG: Recent Labs  Lab 03/01/22 1633 03/01/22 1701  GLUCAP 87 109*     No results found for this or any previous visit (from the past 240 hour(s)).   Radiology Studies: No results found.   Marzetta Board, MD, PhD [facility]  Between 7 am - 7 pm I am available, please contact me via Amion (for emergencies) or Securechat (non urgent messages)  Between 7 pm - 7 am I am not available, please contact night coverage MD/APP via Amion

## 2022-03-07 NOTE — Progress Notes (Addendum)
Called to speak with Ms. Kavanagh about intolerance to doxycyline   She is concerned about impact on GI system that antibiotics have had for her in the past and currently with doxycycline, specifically worsened IBD-C symptoms and loss of appetite.   We discussed options with either watchful waiting with tedious wound care given she has had improvement in tunneling and overall depth of the wound vs wound care + vancomycin post HD.   She would like to continue with watchful waiting / wound care.   I explained that we will not follow along with her at this time as it will take several weeks to determine if this will work to help heal the wound.    ID will sign off - please call back with any questions/concerns or if we can be of further assistance during her hospitalization or if she would like to rediscuss treatment for possible sacral OM described on scan.   I spent 26 minutes with the patient in discussion    Janene Madeira, MSN, NP-C [facility] [email or web address] Pager: [phone] Office: [phone] [facility] Main Line: [place] Communication Welcome

## 2022-03-07 NOTE — Consult Note (Addendum)
Chief Complaint: Malfunctioning HD cath  Referring Physician(s): Penninger, Ria Comment  Supervising Physician: Ruthann Cancer  Patient Status: [facility] - In-pt  History of Present Illness: Courtney Gray is a 51 y.o. female with ESRD via left IJ tunneled catheter.  Her catheter is malfunctioning yet again. We are asked to exchange.  She has scars from previous catheters on the right.  She is NPO.   Past Medical History:  Diagnosis Date   Arthritis    Diabetes mellitus without complication ([place])    Dialysis complication, subsequent encounter    Gout    High cholesterol    Hypertension    Renal disorder     Past Surgical History:  Procedure Laterality Date   ABDOMINAL AORTOGRAM W/LOWER EXTREMITY N/A 08/07/2021   Procedure: ABDOMINAL AORTOGRAM W/LOWER EXTREMITY;  Surgeon: Broadus John, MD;  Location: [place] [facility];  Service: Cardiovascular;  Laterality: N/A;   AMPUTATION Right 08/14/2021   Procedure: AMPUTATION ABOVE KNEE;  Surgeon: Marty Heck, MD;  Location: [place];  Service: Vascular;  Laterality: Right;   AMPUTATION Left 09/23/2021   Procedure: AMPUTATION MIDDLE FINGER;  Surgeon: Sherilyn Cooter, MD;  Location: [place];  Service: Orthopedics;  Laterality: Left;   AMPUTATION Left 10/09/2021   Procedure: LEFT ABOVE KNEE AMPUTATION;  Surgeon: Cherre Robins, MD;  Location: [facility];  Service: Vascular;  Laterality: Left;   AV FISTULA PLACEMENT     x2, unsuccessful.    BUBBLE STUDY  09/27/2021   Procedure: BUBBLE STUDY;  Surgeon: Lelon Perla, MD;  Location: [facility] ENDOSCOPY;  Service: Cardiovascular;;   IR FLUORO GUIDE CV LINE LEFT  12/14/2020   IR FLUORO GUIDE CV LINE LEFT  02/20/2021   IR FLUORO GUIDE CV LINE LEFT  09/11/2021   IR FLUORO GUIDE CV LINE LEFT  10/18/2021   IR FLUORO GUIDE CV LINE LEFT  11/26/2021   IR FLUORO GUIDE CV LINE LEFT  11/28/2021   IR FLUORO GUIDE CV LINE LEFT  02/04/2022   IR FLUORO GUIDE CV LINE RIGHT  08/28/2020   IR FLUORO GUIDE CV LINE  RIGHT  05/13/2021   IR FLUORO GUIDE CV LINE RIGHT  05/24/2021   IR PERC TUN PERIT CATH WO PORT S&I /IMAG  09/06/2020   IR PTA VENOUS EXCEPT DIALYSIS CIRCUIT  02/20/2021   IR PTA VENOUS EXCEPT DIALYSIS CIRCUIT  05/13/2021   IR PTA VENOUS EXCEPT DIALYSIS CIRCUIT  11/28/2021   IR REMOVAL TUN CV CATH W/O FL  05/24/2021   IR REMOVAL TUN CV CATH W/O FL  10/15/2021   IR US GUIDE VASC ACCESS LEFT  09/06/2020   IR US GUIDE VASC ACCESS LEFT  09/11/2021   IR US GUIDE VASC ACCESS LEFT  10/18/2021   IR US GUIDE VASC ACCESS RIGHT  05/24/2021   IR US GUIDE VASC ACCESS RIGHT  09/10/2021   IR VENO/JUGULAR RIGHT  09/10/2021   IR VENOCAVAGRAM SVC  02/20/2021   IR VENOCAVAGRAM SVC  11/26/2021   TEE WITHOUT CARDIOVERSION N/A 09/27/2021   Procedure: TRANSESOPHAGEAL ECHOCARDIOGRAM (TEE);  Surgeon: Lelon Perla, MD;  Location: [place];  Service: Cardiovascular;  Laterality: N/A;   UPPER EXTREMITY VENOGRAPHY Right 12/04/2021   Procedure: UPPER EXTREMITY VENOGRAPHY;  Surgeon: Broadus John, MD;  Location: [place] [facility];  Service: Cardiovascular;  Laterality: Right;    Allergies: Benzonatate, Hydralazine, Amoxicillin, Clonidine, Chlorhexidine, Morphine, and Oxycodone  Medications: Prior to Admission medications   Medication Sig Start Date End Date Taking? Authorizing Provider  acetaminophen (TYLENOL) 500 MG tablet Take 1,000 mg by mouth as needed for moderate pain.   Yes [provider]  Amino Acids-Protein Hydrolys (PRO-STAT RENAL CARE) LIQD Take 30 mLs by mouth 2 (two) times daily. 10/28/21  Yes [provider]  ascorbic acid (VITAMIN C) 500 MG tablet Take 500 mg by mouth daily.   Yes [provider]  atorvastatin (LIPITOR) 10 MG tablet Take 1 tablet (10 mg total) by mouth daily. Patient taking differently: Take 10 mg by mouth at bedtime. 11/28/20  Yes Emokpae, Courage, MD  bisacodyl (FLEET) 10 MG/30ML ENEM Place 10 mg rectally every other day as needed (constipation).   Yes [provider]  calcium carbonate (TUMS SMOOTHIES) 750 MG chewable tablet Chew 1,500 mg by mouth See admin instructions. Take 1500 mg with each meal and 1500 mg with each snack   Yes [provider]  Cholecalciferol 25 MCG (1000 UT) capsule Take 1,000 Units by mouth daily.   Yes [provider]  collagenase (SANTYL) 250 UNIT/GM ointment Apply 1 Application topically daily. Apply to right gluteal fold topically every day shift for wound healing.   Yes [provider]  diphenhydrAMINE (BENADRYL) 25 MG tablet Take 25 mg by mouth 2 (two) times daily as needed for allergies.   Yes [provider]  docusate sodium (COLACE) 100 MG capsule Take 100 mg by mouth 2 (two) times daily.   Yes [provider]  doxycycline (VIBRAMYCIN) 100 MG capsule Take 100 mg by mouth 2 (two) times daily. 10/28/21  Yes [provider]  gabapentin (NEURONTIN) 100 MG capsule Take 1 capsule (100 mg total) by mouth 2 (two) times daily. 08/28/21 11/02/21 Yes Pahwani, Einar Grad, MD  hydroquinone 4 % cream Apply topically 2 (two) times daily. 03/05/22  Yes Carlyle Basques, MD  levothyroxine (SYNTHROID) 100 MCG tablet Take 100 mcg by mouth daily.   Yes [provider]  loperamide (IMODIUM A-D) 2 MG tablet Take 4 mg by mouth as needed for diarrhea or loose stools.   Yes [provider]  melatonin 3 MG TABS tablet Take 3 mg by mouth at bedtime. 11/01/21  Yes [provider]  nitroGLYCERIN (NITROSTAT) 0.4 MG SL tablet Place 1 tablet (0.4 mg total) under the tongue every 5 (five) minutes as needed for chest pain. 05/24/21  Yes [place] Eric J, DO  oxyCODONE (OXY IR/ROXICODONE) 5 MG immediate release tablet Take 1-2 tablets (5-10 mg total) by mouth every 4 (four) hours as needed for moderate pain (pain score 4-6). Patient taking differently: Take 5 mg by mouth every 4 (four) hours as needed for moderate pain (pain score 4-6). 10/21/21  Yes Dessa Phi, DO  OXYGEN Inhale 4  L into the lungs See admin instructions. Oxygen at 4L hr via nasal cannula and will use portable oxygen machine when up in wheelchair. 10/28/21  Yes [provider]  pantoprazole (PROTONIX) 40 MG tablet Take 1 tablet (40 mg total) by mouth daily. 11/29/20  Yes Emokpae, Courage, MD  senna (SENOKOT) 8.6 MG tablet Take 2 tablets by mouth 2 (two) times daily.   Yes [provider]  simethicone (MYLICON) 376 MG chewable tablet Chew 125 mg by mouth in the morning and at bedtime.   Yes [provider]  tiZANidine (ZANAFLEX) 4 MG capsule Take 4 mg by mouth in the morning and at bedtime.   Yes [provider]     Family History  Problem Relation Age of Onset   Diabetes Mother  Diabetes Father     Social History   Socioeconomic History   Marital status: Single    Spouse name: Not on file   Number of children: Not on file   Years of education: Not on file   Highest education level: Not on file  Occupational History   Not on file  Tobacco Use   Smoking status: Never   Smokeless tobacco: Never  Vaping Use   Vaping Use: Never used  Substance and Sexual Activity   Alcohol use: Never   Drug use: Never   Sexual activity: Not on file  Other Topics Concern   Not on file  Social History Narrative   Not on file   Social Determinants of Health   Financial Resource Strain: Not on file  Food Insecurity: No Food Insecurity (12/11/2021)   Hunger Vital Sign    Worried About Running Out of Food in the Last Year: Never true    Ran Out of Food in the Last Year: Never true  Transportation Needs: No Transportation Needs (12/11/2021)   PRAPARE - Hydrologist (Medical): No    Lack of Transportation (Non-Medical): No  Physical Activity: Not on file  Stress: Not on file  Social Connections: Not on file     Review of Systems: A 12 point ROS discussed and pertinent positives are indicated in the HPI above.  All other systems are  negative.  Review of Systems  Vital Signs: BP (!) 116/58 (BP Location: Right Arm)   Pulse 71   Temp 98 F (36.7 C) (Oral)   Resp (!) 22   Ht 5\' 5"  (1.651 m)   Wt 256 lb (116.1 kg)   SpO2 (!) 89%   BMI 42.60 kg/m   Physical Exam Vitals reviewed.  Constitutional:      Appearance: She is obese.  Neck:     Comments: Left IJ cath appears to be in good position with dressing in place. Cardiovascular:     Rate and Rhythm: Normal rate and regular rhythm.  Pulmonary:     Effort: Pulmonary effort is normal. No respiratory distress.     Breath sounds: Normal breath sounds.  Musculoskeletal:     Comments: Bilateral AKAs. Pressure wound on right hip with dressing in place.  Neurological:     General: No focal deficit present.     Mental Status: She is alert and oriented to person, place, and time.  Psychiatric:        Mood and Affect: Mood normal.        Behavior: Behavior normal.        Thought Content: Thought content normal.        Judgment: Judgment normal.     Imaging: CT ABDOMEN PELVIS W CONTRAST  Result Date: 02/17/2022 CLINICAL DATA:  Acute nonlocalized abdominal pain EXAM: CT ABDOMEN AND PELVIS WITH CONTRAST TECHNIQUE: Multidetector CT imaging of the abdomen and pelvis was performed using the standard protocol following bolus administration of intravenous contrast. RADIATION DOSE REDUCTION: This exam was performed according to the departmental dose-optimization program which includes automated exposure control, adjustment of the mA and/or kV according to patient size and/or use of iterative reconstruction technique. CONTRAST:  59mL OMNIPAQUE IOHEXOL 350 MG/ML SOLN COMPARISON:  11/14/2018 FINDINGS: Lower chest: Extensive multi-vessel coronary artery calcification. Calcification of the aortic valve leaflets noted. Calcification within the inferior right atrium may relate to remote inflammation or thrombus. Global cardiac size is within normal limits. Small left pleural effusion  is  present, decreased in size since prior examination. Mild associated left basilar atelectasis. Hepatobiliary: No focal liver abnormality is seen. No gallstones, gallbladder wall thickening, or biliary dilatation. Pancreas: Unremarkable Spleen: Unremarkable Adrenals/Urinary Tract: The adrenal glands are unremarkable. The kidneys are markedly atrophic. Mild bilateral nonspecific perinephric stranding. Bladder is decompressed and is unremarkable. Stomach/Bowel: Stomach is within normal limits. Appendix appears normal. No evidence of bowel wall thickening, distention, or inflammatory changes. Vascular/Lymphatic: Extensive aortoiliac atherosclerotic calcification. Extensive visceral arteriosclerosis. No aortic aneurysm. No pathologic adenopathy within the abdomen and pelvis. Reproductive: Uterus and bilateral adnexa are unremarkable. Other: No abdominal wall hernia. Musculoskeletal: Right gluteal decubitus ulcer has progressed in the interval since prior examination with the ulcer cavity now extending to the right ischium which demonstrates erosion in keeping with changes of osteomyelitis, best seen on axial image # 95/3. Debris noted within the ulcer cavity. No acute bone abnormality. IMPRESSION: 1. No acute intra-abdominal pathology identified. No definite radiographic explanation for the patient's reported symptoms. 2. Extensive multi-vessel coronary artery calcification. 3. Calcification of the aortic valve leaflets. Echocardiography may be helpful to assess the degree of valvular dysfunction. 4. Small left pleural effusion, decreased in size since prior examination. 5. Marked bilateral renal atrophy in keeping with changes of end-stage renal disease 6. Progressive right gluteal decubitus ulcer with ulcer cavity now extending to the right ischium which demonstrates erosion in keeping with changes of osteomyelitis. 7. Extensive aortoiliac and visceral arteriosclerosis. Aortic Atherosclerosis (ICD10-I70.0).  Electronically Signed   By: Fidela Salisbury M.D.   On: 02/17/2022 20:48    Labs:  CBC: Recent Labs    02/22/22 2049 02/27/22 0422 03/01/22 1255 03/03/22 1708  WBC 11.4* 12.2* 14.3* 11.6*  HGB 8.1* 9.5* 7.8* 8.1*  HCT 27.0* 32.2* 25.7* 26.7*  PLT 269 279 261 259    COAGS: Recent Labs    07/10/21 0627 09/10/21 0403 09/28/21 0151 09/30/21 0500 09/30/21 1049 10/01/21 0847 10/02/21 0621 10/09/21 1219  INR 1.3* 1.3*  --   --   --   --   --  1.3*  APTT  --   --    < > 85* 72* 69* 65*  --    < > = values in this interval not displayed.    BMP: Recent Labs    02/22/22 2050 02/27/22 0422 03/01/22 1255 03/03/22 1708  NA 137 137 137 138  K 4.3 3.6 4.7 4.2  CL 99 94* 94* 98  CO2 27 26 24 26   GLUCOSE 133* 121* 125* 110*  BUN 32* 19 46* 33*  CALCIUM 9.3 9.5 10.0 9.5  CREATININE 8.20* 5.68* 10.60* 8.13*  GFRNONAA 5* 8* 4* 6*    LIVER FUNCTION TESTS: Recent Labs    09/14/21 0636 09/19/21 0915 11/01/21 1740 11/06/21 0403 11/08/21 0758 02/20/22 1554 02/22/22 2050 02/27/22 0422 03/01/22 1255  BILITOT 0.7  --  1.5* 0.9  --   --   --  0.3  --   AST 16  --  33 28  --   --   --  17  --   ALT 10  --  8 7  --   --   --  11  --   ALKPHOS 124  --  72 62  --   --   --  66  --   PROT 6.0*  --  6.6 6.9  --   --   --  8.3*  --   ALBUMIN 1.9*  1.9*   < > 2.2* 2.2*   < >  2.5* 2.6* 3.0* 2.5*   < > = values in this interval not displayed.    TUMOR MARKERS: No results for input(s): "AFPTM", "CEA", "CA199", "CHROMGRNA" in the last 8760 hours.  Assessment and Plan:  ESRD on hemodialysis with malfunctioning dialysis catheter. This has been an ongoing issues.  Will proceed with image guided exchange of hemodialysis catheter today and possible intravascular ultrasound to try and determine her on going issue.  Risks and benefits discussed with the patient including, but not limited to bleeding, infection, vascular injury, pneumothorax which may require chest tube placement, air  embolism or even death  All of the patient's questions were answered, patient is agreeable to proceed. Consent signed and in IR.   Thank you for allowing our service to participate in Chanteria Haggard 's care.  Electronically Signed: Murrell Redden, PA-C   03/07/2022, 9:09 AM      I spent a total of   25 Minutes in face to face in clinical consultation, greater than 50% of which was counseling/coordinating care for HD cath exchange.

## 2022-03-07 NOTE — Procedures (Signed)
Interventional Radiology Procedure Note  Procedure:  1) Central venogram 2) IVUS 3) Balloon angioplasty of the SVC and left innominate veins 4) Tunneled central catheter exchange  Findings: Please refer to procedural dictation for full description. Calcified fibrin sheath extending from left innominate vein into the SVC.  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  Balloon disruption and angioplasty of the fibrin sheath and central stenosis with 18 mm balloon.  Replacement of 28 cm tunneled HD with catheter tip in right atrium.  Flushes and aspirates with ease.  Complications: None immediate  Estimated Blood Loss: < 5 mL  Recommendations: Catheter ready for immediate use.  Ruthann Cancer, MD Pager: [phone]

## 2022-03-07 NOTE — Progress Notes (Addendum)
[facility] Progress Note   Subjective:    Seen and examined patient at bedside. HD not completed yesterday d/t TDC no longer working. S/p venogram, angioplasty of SVC and innominate veins, and TDC exchange today by IR. No acute complaints. Next HD 03/08/22.  Objective Vitals:   03/07/22 1115 03/07/22 1120 03/07/22 1125 03/07/22 1140  BP: (!) 122/55 110/66 108/62 124/72  Pulse: 82 84 80 78  Resp: _0 Temp:      TempSrc:      SpO2: 99% 98% 98% 99%  Height:       Physical Exam General:ill appearing female in NAD Heart:RRR, no mrg Lungs:nml WOB on RA Abdomen:soft, ND Extremities:b/l AKA Dialysis Access: [facility]  Filed Weights    Intake/Output Summary (Last 24 hours) at 03/07/2022 1344 Last data filed at 03/07/2022 0858 Gross per 24 hour  Intake 237 ml  Output --  Net 237 ml    Additional Objective Labs: Basic Metabolic Panel: Recent Labs  Lab 03/01/22 1255 03/03/22 1708  NA 137 138  K 4.7 4.2  CL 94* 98  CO2 24 26  GLUCOSE 125* 110*  BUN 46* 33*  CREATININE 10.60* 8.13*  CALCIUM 10.0 9.5  PHOS 5.3*  --    Liver Function Tests: Recent Labs  Lab 03/01/22 1255  ALBUMIN 2.5*   No results for input(s): "LIPASE", "AMYLASE" in the last 168 hours. CBC: Recent Labs  Lab 03/01/22 1255 03/03/22 1708  WBC 14.3* 11.6*  HGB 7.8* 8.1*  HCT 25.7* 26.7*  MCV 97.0 96.4  PLT 261 259   Blood Culture    Component Value Date/Time   SDES BLOOD LEFT ANTECUBITAL 10/16/2021 1534   SPECREQUEST  10/16/2021 1534    BOTTLES DRAWN AEROBIC AND ANAEROBIC Blood Culture results may not be optimal due to an inadequate volume of blood received in culture bottles   CULT  10/16/2021 1534    NO GROWTH 5 DAYS Performed at [facility], [place] [address], [place], [place] [address]    REPTSTATUS 10/21/2021 FINAL 10/16/2021 1534    Cardiac Enzymes: No results for input(s): "CKTOTAL", "CKMB", "CKMBINDEX", "TROPONINI" in the last 168 hours. CBG: Recent  Labs  Lab 03/01/22 1633 03/01/22 1701  GLUCAP 87 109*   Iron Studies: No results for input(s): "IRON", "TIBC", "TRANSFERRIN", "FERRITIN" in the last 72 hours. Lab Results  Component Value Date   INR 1.3 (H) 10/09/2021   INR 1.3 (H) 09/10/2021   INR 1.3 (H) 07/10/2021   Studies/Results: No results found.  Medications:  sodium thiosulfate 12.5 g in sodium chloride 0.9 % 150 mL Infusion for Calciphylaxis     vancomycin      aspirin EC  81 mg Oral Daily   atorvastatin  10 mg Oral Daily   Chlorhexidine Gluconate Cloth  6 each Topical Q0600   darbepoetin (ARANESP) injection - DIALYSIS  200 mcg Subcutaneous Q Tue-1800   docusate sodium  100 mg Oral BID   doxycycline  100 mg Oral BID WC   famotidine  20 mg Oral Daily   feeding supplement  1 Container Oral TID BM   gabapentin  100 mg Oral Q1200   gabapentin  200 mg Oral QHS   Gerhardt's butt cream   Topical BID   hydroquinone   Topical BID   lactulose  10 g Oral Daily   levothyroxine  100 mcg Oral Q0600   loratadine  10 mg Oral Daily   melatonin  3 mg Oral QHS  midodrine  10 mg Oral Once per day on Tue Thu Sat   pantoprazole  40 mg Oral Daily   sevelamer carbonate  4.8 g Oral TID WC     Assessment/Plan: Inability to obtain HD at OP center D/T body habitus and wound. Difficult situation with no clear answers yet. PT working with pt -  met with seating specialist to get evaluated for custom chair/cushion that will allow her sit for longer periods.  Appreciate efforts from PT team.  ESRD: Usual TTS schedule. Continues to refuse to come, discussed importance of compliance. New TDC placed 02/04/2022.  Clotted last week, increased heparin dose.  [place] stopped working again 11/30. IR consulted- S/p venogram, angioplasty of SVC and innominate veins, and TDC exchange today. Next HD 12/2. Painful nodular lesions in abdomen: Initially felt just d/t subq heparin injections, but now worsening.  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 Surgery will not  be able to do biopsy any time soon. Started empirically on sodium thiosulfate, but caused vomiting so stopped. Not on any calcium based products or vitamin D. Agreed to try 1/2 dose Na thiosulfate with HD ~ 12.5g, will titrate up if tolerated -now [facility] is working, will try lower dose at next HD.  Neck rash - etiology unclear.  ?contact dermatitis. PMD made aware.  HypoTN/volume- On midodrine 60m qHD, with 133mprn mid HD w/parameters to keep SBP >100 d/t becoming symptomatic when it drops lower.  Significant weight loss during admission.  Remains euvolemic on exam.  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 Limiting her, noted to be slow healing. Goal to tolerate sitting upright - which she is doing with PT.  ID started doxycycline - causing diarrhea.  COPD history of hypoxia on [place] O2 at home (was on 5 L at baseline has been able to taper to 2-3 L [place]).  Anemia of ESRD - last Hgb 8.1,  on max dose Aranesp 20045mweekly. Recent Fe load completed.  Secondary hyperparathyroidism: Phos at goal - continue Renvela 4.8g AC TID. CorrCa elevated. Not on VDRA, last PTH 60 on 01/30/22. Avoid all calcium based medications for Vitamin D d/t concern for calciphylaxis.  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 Nutrition - diet continues to be changed from renal.  Currently on regular diet.  Should stay on renal diet - she is considering changing back.  Disposition: Difficult situation. Prior to admit, patient required 6 staff members and 2 hoyer pads for transfer into HD chair as OP. Could not stay on HD for full tx. 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  The main issue is her Rt ischial wound. PT has arranged seating specialist with plan for custom power chair/cushion  that would keep pressure off wounds and allow her to sit for longer periods  ADDENDUM: Nephrology service will not see Ms. BaiVacaer the weekend. Please page me at [phone] any issues and/or questions- CouTobie PoetP  CouTobie PoetP [facility] 03/07/2022,1:44 PM  LOS: 126 days

## 2022-03-08 DIAGNOSIS — I5023 Acute on chronic systolic (congestive) heart failure: Secondary | ICD-10-CM | POA: Diagnosis not present

## 2022-03-08 DIAGNOSIS — L89153 Pressure ulcer of sacral region, stage 3: Secondary | ICD-10-CM | POA: Diagnosis not present

## 2022-03-08 DIAGNOSIS — I10 Essential (primary) hypertension: Secondary | ICD-10-CM | POA: Diagnosis not present

## 2022-03-08 DIAGNOSIS — N186 End stage renal disease: Secondary | ICD-10-CM | POA: Diagnosis not present

## 2022-03-08 LAB — CBC
HCT: 24.6 % — ABNORMAL LOW (ref 36.0–46.0)
Hemoglobin: 7.5 g/dL — ABNORMAL LOW (ref 12.0–15.0)
MCH: 29.2 pg (ref 26.0–34.0)
MCHC: 30.5 g/dL (ref 30.0–36.0)
MCV: 95.7 fL (ref 80.0–100.0)
Platelets: 301 10*3/uL (ref 150–400)
RBC: 2.57 MIL/uL — ABNORMAL LOW (ref 3.87–5.11)
RDW: 15.2 % (ref 11.5–15.5)
WBC: 12.2 10*3/uL — ABNORMAL HIGH (ref 4.0–10.5)
nRBC: 0 % (ref 0.0–0.2)

## 2022-03-08 LAB — RENAL FUNCTION PANEL
Albumin: 2.7 g/dL — ABNORMAL LOW (ref 3.5–5.0)
Anion gap: 15 (ref 5–15)
BUN: 51 mg/dL — ABNORMAL HIGH (ref 6–20)
CO2: 24 mmol/L (ref 22–32)
Calcium: 9.2 mg/dL (ref 8.9–10.3)
Chloride: 97 mmol/L — ABNORMAL LOW (ref 98–111)
Creatinine, Ser: 12.3 mg/dL — ABNORMAL HIGH (ref 0.44–1.00)
GFR, Estimated: 3 mL/min — ABNORMAL LOW (ref >60)
Glucose, Bld: 78 mg/dL (ref 70–99)
Phosphorus: 5.6 mg/dL — ABNORMAL HIGH (ref 2.5–4.6)
Potassium: 4.9 mmol/L (ref 3.5–5.1)
Sodium: 136 mmol/L (ref 135–145)

## 2022-03-08 MED ORDER — LACTULOSE 10 GM/15ML PO SOLN
20.0000 g | Freq: Once | ORAL | Status: DC
  Filled 2022-03-08: qty 30

## 2022-03-08 MED ORDER — HEPARIN SODIUM (PORCINE) 1000 UNIT/ML IJ SOLN
INTRAMUSCULAR | Status: AC
  Filled 2022-03-08: qty 4

## 2022-03-08 NOTE — Progress Notes (Signed)
TR[facility] night cross cover note:   I was notified by RN that patient continues to complain of constipation and that she is requesting an extra dose of lactulose relative to her scheduled Lactulose 10 g PO qdaily. I placed order for extra dose of Lactulose 20 g po x 1 now.     Babs Bertin, DO Hospitalist

## 2022-03-08 NOTE — Progress Notes (Signed)
PROGRESS NOTE  Courtney Gray HTD:[id number] DOB: [birth year] DOA: 11/01/2021 PCP: Monico Blitz, MD   LOS: 127 days   Brief Narrative / Interim history: Patient is a 51 year old female, chronically ill with obesity, bedbound status, ESRD, PVD status post right AKA, chronic hypoxic respiratory failure on home O2, sacral decubitus ulcer patient with the patient dilated the hospitalization.  Bacteremia, hepatic and splenic abscess requiring third digit of left hand amputation and left AKA, now bilateral AKA, splenic drain, liver abscess drain  presented to hospital with progressive dyspnea on exertion in the setting of missed HD.  As for the staff patient required to have her left to be seated in her dialysis chair.  Currently patient is awaiting for placement at dialysis center when she can get to treatment on citrate-does not have accepting center so far.  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  On 02/26/2022 patient was consulted for chronic osteomyelitis of the right ischium and was considered for weeks of antibiotic therapy.  Assesement and Plan: Principal Problem:   Acute on chronic systolic CHF (congestive heart failure) (HCC) Active Problems:   End stage renal disease (HCC)   Hypertension   Decubitus ulcer of sacral region, stage 3 (HCC)   Type 2 diabetes mellitus with hyperlipidemia (HCC)   Chronic respiratory failure with hypoxia (HCC)   Bacteremia   PVD (peripheral vascular disease) (HCC)   Class 3 obesity (HCC)   Anemia due to chronic kidney disease   Acute on chronic diastolic CHF (congestive heart failure) (HCC)   Hypertensive emergency   Leukocytosis   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HCC)   Pressure injury of skin   PAD (peripheral artery disease) (HCC)   Irritable bowel syndrome (IBS)   Amputation of left middle finger   GERD (gastroesophageal reflux disease)   Nodule of skin of  abdomen  End stage renal disease with volume overload- Nephrology on board.  Currently undergoing hemodialysis but unable to sit in a chair.  Recent tunnel catheter exchange by IR for catheter malfunction.  History of balloon angioplasty of SVC and intermittent veins.  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    Deep sacral decubitus wound, early osteomyelitis of right ischium -deep wound without overt infection.  CT abdomen pelvis 02/17/22 with some signs of osteomyelitis near right ischium, suspected to chronic osteomyelitis.  ID was consulted and patient was initially on doxycycline which she could not tolerate so has been discontinued.  Watchful waiting has been pursued.   Anemia of chronic kidney disease -hemoglobin of 7.7.  Overall stable.  Received erythropoietin and iron.   Electrolyte imbalances-corrected with renal replacement therapy.     hypotension -On midodrine with HD. Currently off antihypertensive medications.    History of IBS constipation improved with lactulose.  Type 2 diabetes mellitus with hyperlipidemia (HCC) -Stable  PVD (peripheral vascular disease) (HCC) -Bilateral AKA.  Continue supportive care.   COPD -stable on home oxygen at 2 L/min.   Class 3 obesity (HCC) -Body mass index is 42.6 kg/m.  Benefit from weight loss as outpatient.    chronic diastolic CHF (congestive heart failure) ([place]) on hemodialysis for volume management.  Midodrine on dialysis days.  Scheduled Meds:  aspirin EC  81 mg Oral Daily   atorvastatin  10 mg Oral Daily   Chlorhexidine Gluconate Cloth  6 each Topical Q0600  darbepoetin (ARANESP) injection - DIALYSIS  200 mcg Subcutaneous Q Tue-1800   docusate sodium  100 mg Oral BID   famotidine  20 mg Oral Daily   feeding supplement  1 Container Oral TID BM   gabapentin  100 mg Oral Q1200    gabapentin  200 mg Oral QHS   Gerhardt's butt cream   Topical BID   heparin  5,000 Units Dialysis Once in dialysis   hydroquinone   Topical BID   lactulose  10 g Oral Daily   lactulose  20 g Oral Once   levothyroxine  100 mcg Oral Q0600   loratadine  10 mg Oral Daily   melatonin  3 mg Oral QHS   midodrine  10 mg Oral Once per day on Tue Thu Sat   pantoprazole  40 mg Oral Daily   sevelamer carbonate  4.8 g Oral TID WC   Continuous Infusions:  sodium thiosulfate 12.5 g in sodium chloride 0.9 % 150 mL Infusion for Calciphylaxis      DVT prophylaxis:  Heparin subcu    Code Status: Full Code  Family Communication:  Daughter at bedside  Status is: Inpatient  Remains inpatient appropriate because: severity of illness, disposition issues  Consultants:  Nephrology   Subjective   Today, patient was seen and examined at bedside.  Patient complains of mild pain over the right buttocks and anterior abdominal wall at the site of nodules.  Denies any shortness of breath, nausea, vomiting, fever, chills or rigor.  Objective: Vitals:   03/07/22 1140 03/07/22 2034 03/07/22 2110 03/08/22 0435  BP: 124/72  (!) 145/52 (!) 106/49  Pulse: 78  75 72  Resp: 20  17 18   Temp:   97.7 F (36.5 C) 98.4 F (36.9 C)  TempSrc:   Oral Oral  SpO2: 99% 96% 96% (!) 83%  Height:        Intake/Output Summary (Last 24 hours) at 03/08/2022 0809 Last data filed at 03/07/2022 1300 Gross per 24 hour  Intake 240 ml  Output --  Net 240 ml    Wt Readings from Last 3 Encounters:  10/21/21 (!) 144 kg  09/03/21 (!) 150.6 kg  08/28/21 (!) 150.1 kg    Physical examination: General:  Average built, not in obvious distress, alert awake and Communicative, appears older than stated age HENT:   No scleral pallor or icterus noted. Oral mucosa is moist.  Chest:  Clear breath sounds.  Diminished breath sounds bilaterally. No crackles or wheezes.  CVS: S1 &S2 heard. No murmur.  Regular rate and  rhythm. Abdomen: Soft, nontender, nondistended.  Obese abdomen with abdominal wall dressing Extremities: Bilateral above-knee amputation. Psych: Alert, awake and oriented, normal mood CNS:  No cranial nerve deficits.  Power equal in all extremities.   Skin: Warm and dry.  Sacral decubitus ulceration.  Abdominal wall dressing.  Data Reviewed: I have independently reviewed following labs and imaging studies   CBC Recent Labs  Lab 03/01/22 1255 03/03/22 1708 03/07/22 1837  WBC 14.3* 11.6* 11.5*  HGB 7.8* 8.1* 7.7*  HCT 25.7* 26.7* 25.5*  PLT 261 259 266  MCV 97.0 96.4 96.2  MCH 29.4 29.2 29.1  MCHC 30.4 30.3 30.2  RDW 14.8 14.6 15.1     Recent Labs  Lab 03/01/22 1255 03/03/22 1708 03/07/22 1837  NA 137 138 137  K 4.7 4.2 4.9  CL 94* 98 100  CO2 24 26 24   GLUCOSE 125* 110* 84  BUN 46* 33* 44*  CREATININE  10.60* 8.13* 10.74*  CALCIUM 10.0 9.5 9.3  ALBUMIN 2.5*  --  2.6*     ------------------------------------------------------------------------------------------------------------------ No results for input(s): "CHOL", "HDL", "LDLCALC", "TRIG", "CHOLHDL", "LDLDIRECT" in the last 72 hours.  Lab Results  Component Value Date   HGBA1C 5.3 12/19/2021   ------------------------------------------------------------------------------------------------------------------ No results for input(s): "TSH", "T4TOTAL", "T3FREE", "THYROIDAB" in the last 72 hours.  Invalid input(s): "FREET3"  Cardiac Enzymes No results for input(s): "CKMB", "TROPONINI", "MYOGLOBIN" in the last 168 hours.  Invalid input(s): "CK" ------------------------------------------------------------------------------------------------------------------    Component Value Date/Time   BNP 1,414.1 (H) 10/27/2021 0016    CBG: Recent Labs  Lab 03/01/22 1633 03/01/22 1701  GLUCAP 87 109*     No results found for this or any previous visit (from the past 240 hour(s)).   Radiology Studies: IR Fluoro  Guide CV Line Left  Result Date: 03/07/2022 INDICATION: 51 year old female with history of end-stage renal disease presenting with malfunctioning indwelling left tunneled hemodialysis catheter. She has had multiple prior catheter exchanges placements over the past year. EXAM: TUNNELED CENTRAL VENOUS HEMODIALYSIS CATHETER PLACEMENT WITH ULTRASOUND AND FLUOROSCOPIC GUIDANCE MEDICATIONS: Vancomycin 1 gm IV . The antibiotic was given in an appropriate time interval prior to skin puncture. ANESTHESIA/SEDATION: Moderate (conscious) sedation was employed during this procedure. A total of Versed 3 mg and Fentanyl 150 mcg was administered intravenously. Moderate Sedation Time: 89 minutes. The patient's level of consciousness and vital signs were monitored continuously by radiology nursing throughout the procedure under my direct supervision. FLUOROSCOPY TIME:  Three hundred fifty-five mGy COMPLICATIONS: None immediate. PROCEDURE: Informed written consent was obtained from the patient after a discussion of the risks, benefits, and alternatives to treatment. Questions regarding the procedure were encouraged and answered. The left neck and chest were prepped with Betadine in a sterile fashion, and a sterile drape was applied covering the operative field. Maximum barrier sterile technique with sterile gowns and gloves were used for the procedure. A timeout was performed prior to the initiation of the procedure. Local anesthesia was provided by the indwelling catheter skin entry site. The underlying cuff was easily freed with gentle pulling on the catheter tip. A Wholey wire was inserted through the catheter directed into the inferior vena cava. The catheter was removed. A 12 French 45 cm sheath was then placed over the wire with its tip positioned in the peripheral aspect of the left innominate vein. A 0.035 inch intravascular ultrasound catheter was then inserted. Intravascular ultrasound was significant for fibrin sheath  extending from the peripheral innominate vein through the superior vena cava, which was echogenic and tightly surrounding the indwelling catheter. Additionally, there is high-grade stenosis about the central aspect of the innominate vein near the confluence. The vessel diameter of the nonstenotic, central aspect of the left innominate vein measured approximately 19 mm by intravascular ultrasound. Therefore, the left innominate vein and superior vena cava was treated with balloon angioplasty with an 18 mm atlas balloon. Central venogram was then performed which demonstrated no evidence of extravasation brisk antegrade flow into the right atrium. Repeat intravascular ultrasound demonstrated persistent, all the slightly fractured fibrin sheath. Under direct I thus and fluoroscopic visualization, an angled tip 5 French catheter was directed alongside the indwelling IV is probe through the 12 French sheath to attempt to cannulate the lumen of the superior vena cava outside of the fibrin sheath. Unfortunately, this was unsuccessful. Additionally, there is persistent high-grade stenosis near the left innominate confluence. Therefore, additional venoplasty was performed with a 18 mm atlas balloon.  Completion venogram demonstrated improved patency and brisk antegrade flow, with filling defect in the left innominate vein compatible with persistent fibrin sheath. The sheath was then exchanged for a hemostatic peel-away sheath. A 28 cm tip to cuff tunneled hemodialysis catheter was then placed through the peel-away sheath with the catheter tip ultimately positioned within the right atrium. Final catheter positioning was confirmed and documented with a spot radiographic image. The catheter aspirates and flushes normally. The catheter was flushed with appropriate volume heparin dwells. The catheter exit site was secured with a 0-Silk retention suture. Sterile dressings were applied. The patient tolerated the procedure well  without immediate post procedural complication. IMPRESSION: 1. 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 2. Successful balloon angioplasty (18 mm) disruption of the fibrin sheath and treatment of central stenosis. 3. Successful exchange of 28 cm tunneled hemodialysis catheter through indwelling left IJ established track. Ruthann Cancer, MD [facility] Radiology Electronically Signed   By: Ruthann Cancer M.D.   On: 03/07/2022 15:30   IR PTA VENOUS EXCEPT DIALYSIS CIRCUIT  Result Date: 03/07/2022 INDICATION: 51 year old female with history of end-stage renal disease presenting with malfunctioning indwelling left tunneled hemodialysis catheter. She has had multiple prior catheter exchanges placements over the past year. EXAM: TUNNELED CENTRAL VENOUS HEMODIALYSIS CATHETER PLACEMENT WITH ULTRASOUND AND FLUOROSCOPIC GUIDANCE MEDICATIONS: Vancomycin 1 gm IV . The antibiotic was given in an appropriate time interval prior to skin puncture. ANESTHESIA/SEDATION: Moderate (conscious) sedation was employed during this procedure. A total of Versed 3 mg and Fentanyl 150 mcg was administered intravenously. Moderate Sedation Time: 89 minutes. The patient's level of consciousness and vital signs were monitored continuously by radiology nursing throughout the procedure under my direct supervision. FLUOROSCOPY TIME:  Three hundred fifty-five mGy COMPLICATIONS: None immediate. PROCEDURE: Informed written consent was obtained from the patient after a discussion of the risks, benefits, and alternatives to treatment. Questions regarding the procedure were encouraged and answered. The left neck and chest were prepped with Betadine in a sterile fashion, and a sterile drape was applied covering the operative field. Maximum barrier sterile technique with sterile  gowns and gloves were used for the procedure. A timeout was performed prior to the initiation of the procedure. Local anesthesia was provided by the indwelling catheter skin entry site. The underlying cuff was easily freed with gentle pulling on the catheter tip. A Wholey wire was inserted through the catheter directed into the inferior vena cava. The catheter was removed. A 12 French 45 cm sheath was then placed over the wire with its tip positioned in the peripheral aspect of the left innominate vein. A 0.035 inch intravascular ultrasound catheter was then inserted. Intravascular ultrasound was significant for fibrin sheath extending from the peripheral innominate vein through the superior vena cava, which was echogenic and tightly surrounding the indwelling catheter. Additionally, there is high-grade stenosis about the central aspect of the innominate vein near the confluence. The vessel diameter of the nonstenotic, central aspect of the left innominate vein measured approximately 19 mm by intravascular ultrasound. Therefore, the left innominate vein and superior vena cava was treated with balloon angioplasty with an 18 mm atlas balloon. Central venogram was then performed which demonstrated no evidence of extravasation brisk antegrade flow into the right atrium. Repeat intravascular ultrasound demonstrated persistent, all the slightly fractured fibrin sheath. Under direct I thus and fluoroscopic visualization, an angled  tip 5 French catheter was directed alongside the indwelling IV is probe through the 12 French sheath to attempt to cannulate the lumen of the superior vena cava outside of the fibrin sheath. Unfortunately, this was unsuccessful. Additionally, there is persistent high-grade stenosis near the left innominate confluence. Therefore, additional venoplasty was performed with a 18 mm atlas balloon. Completion venogram demonstrated improved patency and brisk antegrade flow, with filling defect in the  left innominate vein compatible with persistent fibrin sheath. The sheath was then exchanged for a hemostatic peel-away sheath. A 28 cm tip to cuff tunneled hemodialysis catheter was then placed through the peel-away sheath with the catheter tip ultimately positioned within the right atrium. Final catheter positioning was confirmed and documented with a spot radiographic image. The catheter aspirates and flushes normally. The catheter was flushed with appropriate volume heparin dwells. The catheter exit site was secured with a 0-Silk retention suture. Sterile dressings were applied. The patient tolerated the procedure well without immediate post procedural complication. IMPRESSION: 1. 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 2. Successful balloon angioplasty (18 mm) disruption of the fibrin sheath and treatment of central stenosis. 3. Successful exchange of 28 cm tunneled hemodialysis catheter through indwelling left IJ established track. Ruthann Cancer, MD [facility] Radiology Electronically Signed   By: Ruthann Cancer M.D.   On: 03/07/2022 15:30   IR Venocavagram Svc  Result Date: 03/07/2022 INDICATION: 51 year old female with history of end-stage renal disease presenting with malfunctioning indwelling left tunneled hemodialysis catheter. She has had multiple prior catheter exchanges placements over the past year. EXAM: TUNNELED CENTRAL VENOUS HEMODIALYSIS CATHETER PLACEMENT WITH ULTRASOUND AND FLUOROSCOPIC GUIDANCE MEDICATIONS: Vancomycin 1 gm IV . The antibiotic was given in an appropriate time interval prior to skin puncture. ANESTHESIA/SEDATION: Moderate (conscious) sedation was employed during this procedure. A total of Versed 3 mg and Fentanyl 150 mcg was administered intravenously. Moderate Sedation Time: 89 minutes. The  patient's level of consciousness and vital signs were monitored continuously by radiology nursing throughout the procedure under my direct supervision. FLUOROSCOPY TIME:  Three hundred fifty-five mGy COMPLICATIONS: None immediate. PROCEDURE: Informed written consent was obtained from the patient after a discussion of the risks, benefits, and alternatives to treatment. Questions regarding the procedure were encouraged and answered. The left neck and chest were prepped with Betadine in a sterile fashion, and a sterile drape was applied covering the operative field. Maximum barrier sterile technique with sterile gowns and gloves were used for the procedure. A timeout was performed prior to the initiation of the procedure. Local anesthesia was provided by the indwelling catheter skin entry site. The underlying cuff was easily freed with gentle pulling on the catheter tip. A Wholey wire was inserted through the catheter directed into the inferior vena cava. The catheter was removed. A 12 French 45 cm sheath was then placed over the wire with its tip positioned in the peripheral aspect of the left innominate vein. A 0.035 inch intravascular ultrasound catheter was then inserted. Intravascular ultrasound was significant for fibrin sheath extending from the peripheral innominate vein through the superior vena cava, which was echogenic and tightly surrounding the indwelling catheter. Additionally, there is high-grade stenosis about the central aspect of the innominate vein near the confluence. The vessel diameter of the nonstenotic, central aspect of the left innominate vein measured approximately 19 mm by intravascular ultrasound. Therefore, the  left innominate vein and superior vena cava was treated with balloon angioplasty with an 18 mm atlas balloon. Central venogram was then performed which demonstrated no evidence of extravasation brisk antegrade flow into the right atrium. Repeat intravascular ultrasound demonstrated  persistent, all the slightly fractured fibrin sheath. Under direct I thus and fluoroscopic visualization, an angled tip 5 French catheter was directed alongside the indwelling IV is probe through the 12 French sheath to attempt to cannulate the lumen of the superior vena cava outside of the fibrin sheath. Unfortunately, this was unsuccessful. Additionally, there is persistent high-grade stenosis near the left innominate confluence. Therefore, additional venoplasty was performed with a 18 mm atlas balloon. Completion venogram demonstrated improved patency and brisk antegrade flow, with filling defect in the left innominate vein compatible with persistent fibrin sheath. The sheath was then exchanged for a hemostatic peel-away sheath. A 28 cm tip to cuff tunneled hemodialysis catheter was then placed through the peel-away sheath with the catheter tip ultimately positioned within the right atrium. Final catheter positioning was confirmed and documented with a spot radiographic image. The catheter aspirates and flushes normally. The catheter was flushed with appropriate volume heparin dwells. The catheter exit site was secured with a 0-Silk retention suture. Sterile dressings were applied. The patient tolerated the procedure well without immediate post procedural complication. IMPRESSION: 1. 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 2. Successful balloon angioplasty (18 mm) disruption of the fibrin sheath and treatment of central stenosis. 3. Successful exchange of 28 cm tunneled hemodialysis catheter through indwelling left IJ established track. Ruthann Cancer, MD [facility] Radiology Electronically Signed   By: Ruthann Cancer M.D.   On: 03/07/2022 15:30   IR INTRAVASCULAR ULTRASOUND NON CORONARY  Result Date: 03/07/2022 INDICATION:  51 year old female with history of end-stage renal disease presenting with malfunctioning indwelling left tunneled hemodialysis catheter. She has had multiple prior catheter exchanges placements over the past year. EXAM: TUNNELED CENTRAL VENOUS HEMODIALYSIS CATHETER PLACEMENT WITH ULTRASOUND AND FLUOROSCOPIC GUIDANCE MEDICATIONS: Vancomycin 1 gm IV . The antibiotic was given in an appropriate time interval prior to skin puncture. ANESTHESIA/SEDATION: Moderate (conscious) sedation was employed during this procedure. A total of Versed 3 mg and Fentanyl 150 mcg was administered intravenously. Moderate Sedation Time: 89 minutes. The patient's level of consciousness and vital signs were monitored continuously by radiology nursing throughout the procedure under my direct supervision. FLUOROSCOPY TIME:  Three hundred fifty-five mGy COMPLICATIONS: None immediate. PROCEDURE: Informed written consent was obtained from the patient after a discussion of the risks, benefits, and alternatives to treatment. Questions regarding the procedure were encouraged and answered. The left neck and chest were prepped with Betadine in a sterile fashion, and a sterile drape was applied covering the operative field. Maximum barrier sterile technique with sterile gowns and gloves were used for the procedure. A timeout was performed prior to the initiation of the procedure. Local anesthesia was provided by the indwelling catheter skin entry site. The underlying cuff was easily freed with gentle pulling on the catheter tip. A Wholey wire was inserted through the catheter directed into the inferior vena cava. The catheter was removed. A 12 French 45 cm sheath was then placed over the wire with its tip positioned in the peripheral aspect of the left innominate vein. A 0.035 inch intravascular ultrasound catheter was then inserted. Intravascular ultrasound was significant for fibrin sheath  extending from the peripheral innominate vein through the  superior vena cava, which was echogenic and tightly surrounding the indwelling catheter. Additionally, there is high-grade stenosis about the central aspect of the innominate vein near the confluence. The vessel diameter of the nonstenotic, central aspect of the left innominate vein measured approximately 19 mm by intravascular ultrasound. Therefore, the left innominate vein and superior vena cava was treated with balloon angioplasty with an 18 mm atlas balloon. Central venogram was then performed which demonstrated no evidence of extravasation brisk antegrade flow into the right atrium. Repeat intravascular ultrasound demonstrated persistent, all the slightly fractured fibrin sheath. Under direct I thus and fluoroscopic visualization, an angled tip 5 French catheter was directed alongside the indwelling IV is probe through the 12 French sheath to attempt to cannulate the lumen of the superior vena cava outside of the fibrin sheath. Unfortunately, this was unsuccessful. Additionally, there is persistent high-grade stenosis near the left innominate confluence. Therefore, additional venoplasty was performed with a 18 mm atlas balloon. Completion venogram demonstrated improved patency and brisk antegrade flow, with filling defect in the left innominate vein compatible with persistent fibrin sheath. The sheath was then exchanged for a hemostatic peel-away sheath. A 28 cm tip to cuff tunneled hemodialysis catheter was then placed through the peel-away sheath with the catheter tip ultimately positioned within the right atrium. Final catheter positioning was confirmed and documented with a spot radiographic image. The catheter aspirates and flushes normally. The catheter was flushed with appropriate volume heparin dwells. The catheter exit site was secured with a 0-Silk retention suture. Sterile dressings were applied. The patient tolerated the procedure well without immediate post procedural complication. IMPRESSION: 1.  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 2. Successful balloon angioplasty (18 mm) disruption of the fibrin sheath and treatment of central stenosis. 3. Successful exchange of 28 cm tunneled hemodialysis catheter through indwelling left IJ established track. Ruthann Cancer, MD [facility] Radiology Electronically Signed   By: Ruthann Cancer M.D.   On: 03/07/2022 15:30     Flora Lipps, MD [facility]

## 2022-03-08 NOTE — Progress Notes (Signed)
Patient refused to be weigh in the bed. She stated in a yelling voice  that bed scale is not working and accurate as she lost 60 lbs and impossible for her to be 299 lbs. RN tried to explain to patient that RN still needs to check as if the bed is broken it should be used. Then continue to state that I should have known it as I have worked with her before and all morning staff knew about it. RN continue to explain  that I haven't worked with her at all. Patient asked to speak to Charge RN. And verbalized that she wanted to go back to her room as she wanted to use the bathroom.charged offer patient to use bed pan. And patient agreed to it. Patient then called RN to apologized for raising her voice.

## 2022-03-08 NOTE — Progress Notes (Signed)
Pt refused to go to HD today as she still needs to eat her lunch. HR nurse was informed and said that there is not guarantee that she will be on dialysis if she refuse now, pt was informed but pt said "They always say that".

## 2022-03-08 NOTE — Progress Notes (Signed)
   03/08/22 2130  Vitals  Temp 98.7 F (37.1 C)  Temp Source Oral  BP (!) 140/45  MAP (mmHg) 70  BP Location Right Arm  BP Method Automatic  Patient Position (if appropriate) Lying  ECG Heart Rate 72  Resp 12  Post Treatment  Dialyzer Clearance Lightly streaked  Duration of HD Treatment -hour(s) 3 hour(s)  Liters Processed 72  Fluid Removed (mL) 1000 mL  Tolerated HD Treatment Yes  Post-Hemodialysis Comments sodium thiosulfate 250 MG/ML Soln, didn't seem to make her feel sick this time.   TX fin. W/o difficulty.

## 2022-03-09 DIAGNOSIS — I10 Essential (primary) hypertension: Secondary | ICD-10-CM | POA: Diagnosis not present

## 2022-03-09 DIAGNOSIS — L89153 Pressure ulcer of sacral region, stage 3: Secondary | ICD-10-CM | POA: Diagnosis not present

## 2022-03-09 DIAGNOSIS — N186 End stage renal disease: Secondary | ICD-10-CM | POA: Diagnosis not present

## 2022-03-09 DIAGNOSIS — I5023 Acute on chronic systolic (congestive) heart failure: Secondary | ICD-10-CM | POA: Diagnosis not present

## 2022-03-09 MED ORDER — LACTULOSE 10 GM/15ML PO SOLN
10.0000 g | Freq: Three times a day (TID) | ORAL | Status: DC
  Administered 2022-03-09 – 2022-03-21 (×25): 10 g via ORAL
  Filled 2022-03-09 (×34): qty 15

## 2022-03-09 MED ORDER — LACTULOSE 10 GM/15ML PO SOLN: 10.0000 g | Freq: Two times a day (BID) | ORAL | Status: DC

## 2022-03-09 NOTE — Progress Notes (Addendum)
PROGRESS NOTE  Courtney Gray [id number] DOB: Feb 21, 1971 DOA: 11/01/2021 PCP: Monico Blitz, MD   LOS: 128 days   Brief Narrative / Interim history: Patient is a 51 year old female, chronically ill with obesity, bedbound status, ESRD, PVD status post right AKA, chronic hypoxic respiratory failure on home O2, sacral decubitus ulcer patient with the patient dilated the hospitalization.  Bacteremia, hepatic and splenic abscess requiring third digit of left hand amputation and left AKA, now bilateral AKA, splenic drain, liver abscess drain  presented to hospital with progressive dyspnea on exertion in the setting of missed HD.  As for the staff patient required to have her left to be seated in her dialysis chair.  Currently patient is awaiting for placement at dialysis center when she can get to treatment on citrate-does not have accepting center so far.  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  On 02/26/2022 patient was consulted for chronic osteomyelitis of the right ischium and was considered for weeks of antibiotic therapy.  Assesement and Plan: Principal Problem:   Acute on chronic systolic CHF (congestive heart failure) (HCC) Active Problems:   End stage renal disease (HCC)   Hypertension   Decubitus ulcer of sacral region, stage 3 (HCC)   Type 2 diabetes mellitus with hyperlipidemia (HCC)   Chronic respiratory failure with hypoxia (HCC)   Bacteremia   PVD (peripheral vascular disease) (HCC)   Class 3 obesity (HCC)   Anemia due to chronic kidney disease   Acute on chronic diastolic CHF (congestive heart failure) (HCC)   Hypertensive emergency   Leukocytosis   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HCC)   Pressure injury of skin   PAD (peripheral artery disease) (HCC)   Irritable bowel syndrome (IBS)   Amputation of left middle finger   GERD (gastroesophageal reflux disease)   Nodule of skin of  abdomen  End stage renal disease with volume overload- Nephrology on board.  Currently undergoing hemodialysis but unable to sit in a chair.  Recent tunnel catheter exchange by IR for catheter malfunction.  History of balloon angioplasty of SVC and intermittent veins.  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  Received sodium thiosulfate yesterday.  Deep sacral decubitus wound, early osteomyelitis of right ischium -deep wound without overt infection.  CT abdomen pelvis 02/17/22 with some signs of osteomyelitis near right ischium, suspected to chronic osteomyelitis.  ID was consulted and patient was initially on doxycycline which she could not tolerate so has been discontinued.  Watchful waiting has been pursued.   Anemia of chronic kidney disease -hemoglobin of 7.5.  Overall stable.  Received erythropoietin and iron.   Electrolyte imbalances-corrected with renal replacement therapy.  Latest potassium of 4.9.   Hypotension -On midodrine with HD. Currently off antihypertensive medications.    History of IBS constipation improved with lactulose.  Type 2 diabetes mellitus with hyperlipidemia (HCC) -Stable  PVD (peripheral vascular disease) (HCC) -Bilateral AKA.  Continue supportive care.   COPD -stable on home oxygen at 2 L/min.   Class 3 obesity (HCC) -Body mass index is 42.6 kg/m.  Patient will benefit from weight loss as outpatient.    chronic diastolic CHF (congestive heart failure) ([place]) on hemodialysis for volume management.   Scheduled Meds:  aspirin EC  81 mg Oral Daily   atorvastatin  10 mg Oral Daily   Chlorhexidine Gluconate Cloth  6 each Topical Q0600   darbepoetin (ARANESP) injection - DIALYSIS  200 mcg Subcutaneous Q Tue-1800   docusate sodium  100 mg Oral BID   famotidine  20 mg Oral Daily   feeding supplement  1 Container  Oral TID BM   gabapentin  100 mg Oral Q1200   gabapentin  200 mg Oral QHS   Gerhardt's butt cream   Topical BID   hydroquinone   Topical BID   lactulose  10 g Oral Daily   lactulose  20 g Oral Once   levothyroxine  100 mcg Oral Q0600   loratadine  10 mg Oral Daily   melatonin  3 mg Oral QHS   midodrine  10 mg Oral Once per day on Tue Thu Sat   pantoprazole  40 mg Oral Daily   sevelamer carbonate  4.8 g Oral TID WC   Continuous Infusions:  sodium thiosulfate 12.5 g in sodium chloride 0.9 % 150 mL 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  Unable to put SCDs.  Patient has had multiple knots in her abdomen and is not willing to continue more heparin shots.  Will review in few days.    Code Status: Full Code  Family Communication:  Daughter at bedside  Status is: Inpatient  Remains inpatient appropriate because:  disposition issues  Consultants:  Nephrology  Subjective   Today, patient was seen and examined at bedside.  Complains of mild pain at the abdominal wall and right buttocks area.  Complains of nausea and constipation.  Received sodium thiosulfate yesterday.   Objective: Vitals:   03/08/22 2115 03/08/22 2130 03/08/22 2219 03/09/22 0938  BP: (!) 148/72 (!) 140/45 93/82 (!) 147/64  Pulse:   70 69  Resp: 18 12 15 17   Temp:  98.7 F (37.1 C) 98.5 F (36.9 C) (!) 97.5 F (36.4 C)  TempSrc:  Oral Oral Oral  SpO2:  98% 100% 100%  Height:        Intake/Output Summary (Last 24 hours) at 03/09/2022 1035 Last data filed at 03/09/2022 0900 Gross per 24 hour  Intake 240 ml  Output 1000 ml  Net -760 ml    Wt Readings from Last 3 Encounters:  10/21/21 (!) 144 kg  09/03/21 (!) 150.6 kg  08/28/21 (!) 150.1 kg    Physical examination: General: Alert awake and Communicative, appears older than stated age, on nasal cannula oxygen, average built  HENT:   No scleral pallor or icterus noted. Oral mucosa is moist.  Chest:  Clear breath  sounds.  Diminished breath sounds bilaterally. No crackles or wheezes.  CVS: S1 &S2 heard. No murmur.  Regular rate and rhythm. Abdomen: Soft, nontender, nondistended.  His abdomen.  Dressing over the ulcerated nodular area.   Extremities: Bilateral above-knee amputation. Psych: Alert, awake and oriented, normal mood CNS:  No cranial nerve deficits.  Power equal in all extremities.   Skin: Warm and dry.  Sacral decubitus ulceration, bilateral above-knee amputation.  Abdominal wall dressing.  Data Reviewed: I have independently reviewed following labs and imaging studies   CBC Recent Labs  Lab 03/03/22 1708 03/07/22 1837 03/08/22 1830  WBC 11.6* 11.5* 12.2*  HGB 8.1* 7.7* 7.5*  HCT 26.7* 25.5* 24.6*  PLT 259 266 301  MCV 96.4 96.2 95.7  MCH 29.2 29.1 29.2  MCHC 30.3 30.2 30.5  RDW 14.6 15.1 15.2     Recent Labs  Lab 03/03/22 1708 03/07/22 1837 03/08/22 1830  NA  138 137 136  K 4.2 4.9 4.9  CL 98 100 97*  CO2 26 24 24   GLUCOSE 110* 84 78  BUN 33* 44* 51*  CREATININE 8.13* 10.74* 12.30*  CALCIUM 9.5 9.3 9.2  ALBUMIN  --  2.6* 2.7*     ------------------------------------------------------------------------------------------------------------------ No results for input(s): "CHOL", "HDL", "LDLCALC", "TRIG", "CHOLHDL", "LDLDIRECT" in the last 72 hours.  Lab Results  Component Value Date   HGBA1C 5.3 12/19/2021   ------------------------------------------------------------------------------------------------------------------ No results for input(s): "TSH", "T4TOTAL", "T3FREE", "THYROIDAB" in the last 72 hours.  Invalid input(s): "FREET3"  Cardiac Enzymes No results for input(s): "CKMB", "TROPONINI", "MYOGLOBIN" in the last 168 hours.  Invalid input(s): "CK" ------------------------------------------------------------------------------------------------------------------    Component Value Date/Time   BNP 1,414.1 (H) 10/27/2021 0016    CBG: No results for  input(s): "GLUCAP" in the last 168 hours.   No results found for this or any previous visit (from the past 240 hour(s)).   Radiology Studies: No results found.   Flora Lipps, MD [facility]

## 2022-03-10 DIAGNOSIS — N186 End stage renal disease: Secondary | ICD-10-CM | POA: Diagnosis not present

## 2022-03-10 DIAGNOSIS — I10 Essential (primary) hypertension: Secondary | ICD-10-CM | POA: Diagnosis not present

## 2022-03-10 DIAGNOSIS — I5023 Acute on chronic systolic (congestive) heart failure: Secondary | ICD-10-CM | POA: Diagnosis not present

## 2022-03-10 DIAGNOSIS — L89153 Pressure ulcer of sacral region, stage 3: Secondary | ICD-10-CM | POA: Diagnosis not present

## 2022-03-10 MED ORDER — SENNA 8.6 MG PO TABS: 1.0000 | ORAL_TABLET | Freq: Every day | ORAL | Status: DC | PRN

## 2022-03-10 MED ORDER — BISACODYL 10 MG RE SUPP
10.0000 mg | Freq: Every day | RECTAL | Status: DC | PRN
  Administered 2022-03-10 – 2022-05-07 (×7): 10 mg via RECTAL
  Filled 2022-03-10 (×9): qty 1

## 2022-03-10 MED ORDER — SUCROFERRIC OXYHYDROXIDE 500 MG PO CHEW
500.0000 mg | CHEWABLE_TABLET | Freq: Three times a day (TID) | ORAL | Status: DC
  Administered 2022-03-12 – 2022-05-13 (×127): 500 mg via ORAL
  Filled 2022-03-10 (×147): qty 1

## 2022-03-10 NOTE — Progress Notes (Signed)
PROGRESS NOTE  Courtney Gray [id number]:[id number] DOB: 14-Aug-1970 DOA: 11/01/2021 PCP: Monico Blitz, MD   LOS: 129 days   Brief Narrative / Interim history: Patient is a 51 year old female, chronically ill with obesity, bedbound status, ESRD, PVD status post right AKA, chronic hypoxic respiratory failure on home O2, sacral decubitus ulcer patient with the patient dilated the hospitalization.  Bacteremia, hepatic and splenic abscess requiring third digit of left hand amputation and left AKA, now bilateral AKA, splenic drain, liver abscess drain  presented to hospital with progressive dyspnea on exertion in the setting of missed HD.  As for the staff patient required to have her left to be seated in her dialysis chair.  Currently patient is awaiting for placement at dialysis center when she can get to treatment on citrate-does not have accepting center so far.  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  On 02/26/2022, patient was consulted for chronic osteomyelitis of the right ischium and was considered for weeks of antibiotic therapy.  Assesement and Plan: Principal Problem:   Acute on chronic systolic CHF (congestive heart failure) (HCC) Active Problems:   End stage renal disease (HCC)   Hypertension   Decubitus ulcer of sacral region, stage 3 (HCC)   Type 2 diabetes mellitus with hyperlipidemia (HCC)   Chronic respiratory failure with hypoxia (HCC)   Bacteremia   PVD (peripheral vascular disease) (HCC)   Class 3 obesity (HCC)   Anemia due to chronic kidney disease   Acute on chronic diastolic CHF (congestive heart failure) (HCC)   Hypertensive emergency   Leukocytosis   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HCC)   Pressure injury of skin   PAD (peripheral artery disease) (HCC)   Irritable bowel syndrome (IBS)   Amputation of left middle finger   GERD (gastroesophageal reflux disease)   Nodule of skin of  abdomen  End stage renal disease with volume overload- Nephrology on board.  Currently undergoing hemodialysis but unable to sit in a chair.  Recent tunnel catheter exchange by IR for catheter malfunction.  History of balloon angioplasty of SVC and intermittent veins.  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  Received sodium thiosulfate yesterday.  Not much of nausea or vomiting today.  Deep sacral decubitus wound, early osteomyelitis of right ischium -deep wound without overt infection.  CT abdomen pelvis 02/17/22 with some signs of osteomyelitis near right ischium, suspected to chronic osteomyelitis.  ID was consulted and patient was initially on doxycycline which she could not tolerate so has been discontinued.  Watchful waiting has been pursued.   Anemia of chronic kidney disease -hemoglobin of 7.5.  Overall stable.  Received erythropoietin and iron.   Electrolyte imbalances-corrected with renal replacement therapy.  Latest potassium of 4.9.   Hypotension -On midodrine with HD. Currently off antihypertensive medications.    History of IBS constipation improved with lactulose.  Type 2 diabetes mellitus with hyperlipidemia (HCC) -Stable  PVD (peripheral vascular disease) (HCC) -Bilateral AKA.  Continue supportive care.   COPD -stable on home oxygen at 2 L/min.   Class 3 obesity (HCC) -Body mass index is 45.6 kg/m.  Patient will benefit from weight loss as outpatient.    chronic diastolic CHF (congestive heart failure) ([place]) on hemodialysis for volume management.   Scheduled Meds:  aspirin EC  81 mg Oral Daily   atorvastatin  10 mg  Oral Daily   Chlorhexidine Gluconate Cloth  6 each Topical Q0600   darbepoetin (ARANESP) injection - DIALYSIS  200 mcg Subcutaneous Q Tue-1800   docusate sodium  100 mg Oral BID   famotidine  20 mg Oral Daily    feeding supplement  1 Container Oral TID BM   gabapentin  100 mg Oral Q1200   gabapentin  200 mg Oral QHS   Gerhardt's butt cream   Topical BID   hydroquinone   Topical BID   lactulose  10 g Oral TID   levothyroxine  100 mcg Oral Q0600   loratadine  10 mg Oral Daily   melatonin  3 mg Oral QHS   midodrine  10 mg Oral Once per day on Tue Thu Sat   pantoprazole  40 mg Oral Daily   sevelamer carbonate  4.8 g Oral TID WC   Continuous Infusions:  sodium thiosulfate 12.5 g in sodium chloride 0.9 % 150 mL 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  Unable to put SCDs.  Patient has had multiple knots in her abdomen and is not willing to continue more heparin shots.  Will review in few days.    Code Status: Full Code  Family Communication:  Daughter at bedside  Status is: Inpatient  Remains inpatient appropriate because:  disposition issues, difficulty with hemodialysis chair  Consultants:  Nephrology  Subjective   Today, patient was seen and examined at bedside.  Complains of constipation and some abdominal wall pain.  Wishes to have enema.  Objective: Vitals:   03/09/22 2203 03/10/22 0500 03/10/22 0559 03/10/22 0958  BP: (!) 149/73  (!) 131/58 135/72  Pulse: 75  75 73  Resp: 18  18 18   Temp: 98.4 F (36.9 C)  98 F (36.7 C) 98.1 F (36.7 C)  TempSrc: Oral  Oral Oral  SpO2: 93%  100% 99%  Weight:  124.3 kg    Height:        Intake/Output Summary (Last 24 hours) at 03/10/2022 1052 Last data filed at 03/10/2022 0555 Gross per 24 hour  Intake 870 ml  Output 0 ml  Net 870 ml    Wt Readings from Last 3 Encounters:  03/10/22 124.3 kg  10/21/21 (!) 144 kg  09/03/21 (!) 150.6 kg    Physical examination: Body mass index is 45.6 kg/m.  General: Alert awake and oriented, appears older than stated age, on nasal cannula oxygen, morbidly obese HENT:   No scleral pallor or icterus noted. Oral mucosa is moist.  Chest: Diminished breath  sounds bilaterally..  CVS: S1 &S2 heard. No murmur.  Regular rate and rhythm. Abdomen: Soft, nontender, obese,  Dressing over the ulcerated nodular area.   Extremities: Bilateral above-knee amputation. Psych: Alert, awake and oriented, normal mood CNS:  No cranial nerve deficits.  Skin: Warm and dry.  Sacral decubitus ulceration, bilateral above-knee amputation.  Abdominal wall dressing.  Data Reviewed: I have independently reviewed following labs and imaging studies   CBC Recent Labs  Lab 03/03/22 1708 03/07/22 1837 03/08/22 1830  WBC 11.6* 11.5* 12.2*  HGB 8.1* 7.7* 7.5*  HCT 26.7* 25.5* 24.6*  PLT 259 266 301  MCV 96.4 96.2 95.7  MCH 29.2 29.1 29.2  MCHC 30.3 30.2 30.5  RDW 14.6 15.1 15.2     Recent Labs  Lab 03/03/22 1708 03/07/22 1837 03/08/22 1830  NA 138 137 136  K 4.2 4.9 4.9  CL 98 100 97*  CO2 26  24 24  GLUCOSE 110* 84 78  BUN 33* 44* 51*  CREATININE 8.13* 10.74* 12.30*  CALCIUM 9.5 9.3 9.2  ALBUMIN  --  2.6* 2.7*     ------------------------------------------------------------------------------------------------------------------ No results for input(s): "CHOL", "HDL", "LDLCALC", "TRIG", "CHOLHDL", "LDLDIRECT" in the last 72 hours.  Lab Results  Component Value Date   HGBA1C 5.3 12/19/2021   ------------------------------------------------------------------------------------------------------------------ No results for input(s): "TSH", "T4TOTAL", "T3FREE", "THYROIDAB" in the last 72 hours.  Invalid input(s): "FREET3"  Cardiac Enzymes No results for input(s): "CKMB", "TROPONINI", "MYOGLOBIN" in the last 168 hours.  Invalid input(s): "CK" ------------------------------------------------------------------------------------------------------------------    Component Value Date/Time   BNP 1,414.1 (H) 10/27/2021 0016    CBG: No results for input(s): "GLUCAP" in the last 168 hours.   No results found for this or any previous visit (from the  past 240 hour(s)).   Radiology Studies: No results found.   Flora Lipps, MD [facility]

## 2022-03-10 NOTE — Progress Notes (Signed)
PT Cancellation Note  Patient Details Name: Courtney Gray MRN: [id number] DOB: Dec 28, 1970   Cancelled Treatment:    Reason Eval/Treat Not Completed: Patient declined x2. Having a BM first attempt today. Then reports still having a BM on second attempt. Does not wish to get OOB at this time. Will continue to follow and work with patient when she is willing and able.  Candie Mile, PT, DPT Physical Therapist [facility]  03/10/2022, 4:48 PM

## 2022-03-10 NOTE — Progress Notes (Signed)
[facility] Progress Note   Subjective:  Seen in room - ongoing pain in abdominal lesions. Denies CP/dyspnea. Dialyzed on 12/2 - net UF only 1L. She c/o constipation - says that the lactulose only "helps a little bit" - she is requesting to have an enema to assist - will order.  Objective Vitals:   03/09/22 2203 03/10/22 0500 03/10/22 0559 03/10/22 0958  BP: (!) 149/73  (!) 131/58 135/72  Pulse: 75  75 73  Resp: _0 Temp: 98.4 F (36.9 C)  98 F (36.7 C) 98.1 F (36.7 C)  TempSrc: Oral  Oral Oral  SpO2: 93%  100% 99%  Weight:  124.3 kg    Height:       Physical Exam General: Chronically ill appearing woman, NAD.  Heart: RRR; no murmur Lungs: CTAB; no rales Abdomen: obese; tender/firm purplish nodules to B abdomen - mostly bandaged. Extremities: B AKA; no stump edema Dialysis Access:  [facility] (s/p recent exchange)  Additional Objective Labs: Basic Metabolic Panel: Recent Labs  Lab 03/03/22 1708 03/07/22 1837 03/08/22 1830  NA 138 137 136  K 4.2 4.9 4.9  CL 98 100 97*  CO2 _1 GLUCOSE 110* 84 78  BUN 33* 44* 51*  CREATININE 8.13* 10.74* 12.30*  CALCIUM 9.5 9.3 9.2  PHOS  --  5.9* 5.6*   Liver Function Tests: Recent Labs  Lab 03/07/22 1837 03/08/22 1830  ALBUMIN 2.6* 2.7*   CBC: Recent Labs  Lab 03/03/22 1708 03/07/22 1837 03/08/22 1830  WBC 11.6* 11.5* 12.2*  HGB 8.1* 7.7* 7.5*  HCT 26.7* 25.5* 24.6*  MCV 96.4 96.2 95.7  PLT 259 266 301   Medications:  sodium thiosulfate 12.5 g in sodium chloride 0.9 % 150 mL 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 (03/08/22 2009)    aspirin EC  81 mg Oral Daily   atorvastatin  10 mg Oral Daily   Chlorhexidine Gluconate Cloth  6 each Topical Q0600   darbepoetin (ARANESP) injection - DIALYSIS  200 mcg Subcutaneous Q Tue-1800   docusate sodium  100 mg Oral BID   famotidine  20 mg Oral Daily   feeding supplement  1 Container Oral TID BM   gabapentin  100 mg Oral Q1200   gabapentin  200 mg Oral  QHS   Gerhardt's butt cream   Topical BID   hydroquinone   Topical BID   lactulose  10 g Oral TID   levothyroxine  100 mcg Oral Q0600   loratadine  10 mg Oral Daily   melatonin  3 mg Oral QHS   midodrine  10 mg Oral Once per day on Tue Thu Sat   pantoprazole  40 mg Oral Daily   sevelamer carbonate  4.8 g Oral TID WC    Dialysis Orders: Was only dialyzed a few times as outpatient - no current OP spot at this time.  Assessment/Plan: Inability to obtain HD at OP center D/T body habitus and wound: Difficult situation with no clear answers yet. PT working with pt -  met with seating specialist to get evaluated for custom chair/cushion that will allow her sit for longer periods.  Appreciate efforts from PT team.  ESRD: Usual TTS schedule - was refusing HD at times - has been discussed extensively. New TDC placed 02/04/2022, TDC stopped working again 11/30. IR consulted - S/p venogram, angioplasty of SVC and innominate veins, and TDC exchange. Next HD tomorrow (12/5). 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 Started empirically on sodium thiosulfate, but caused vomiting so stopped - now getting 1/2 dose - 12.44m q HD. Not on any calcium based products or vitamin D. Needs better control of Phos. Will add on Velphoro as well. HypoTN/volume: On midodrine 11mqHD, with 1038mrn mid HD w/parameters to keep SBP >100 d/t becoming symptomatic when it drops lower. Significant weight loss this admit. Severe ischial sacral decubitus wound: CT showing chronic osteomyelitis. Limiting her, noted to be slow healing. Goal to tolerate sitting upright - which she is doing with PT.  ID started doxycycline which caused diarrhea.  COPD/Chronic O2 use: Prior O2 at home (5L/min) -> tapered to 2-3L/min here Anemia of ESRD: Hgb down to 7.5 - on max dose Aranesp 200m24meekly. Recent Fe load completed. Will check stool cards. Secondary  hyperparathyroidism: Phos high again - ordered for Renvela 4.8g AC TID, refusing/missing at times. Add Velphoro 500mg68m as well. CorrCa elevated. Not on VDRA, last PTH 60 on 01/30/22 - will recheck. Avoid all calcium based medications for Vitamin D d/t concern for calciphylaxis.  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 Nutrition - diet continues to be changed from renal.  Currently on regular diet.  Should stay on renal diet - she is considering changing back.  Disposition: Difficult situation. Prior to admit, patient required 6 staff members and 2 hoyer pads for transfer into HD chair. Could not stay on HD for full tx. 2/2 pain of multiple decubitus ulcers and PTAR unable to lift pt into ambulance for transport.SW working on LTC at SNF. Con-wayl need to tolerate 5-6hrs in a chair to tolerate OP dialysis including transport, treatment and cannulation time.  The main issue is her Rt ischial wound. PT has arranged seating specialist with plan for custom power chair/cushion that would keep pressure off wounds and allow her to sit for longer periods  KatieVeneta PentonC 03/10/2022, 11:48 AM  [employer]

## 2022-03-11 DIAGNOSIS — I5023 Acute on chronic systolic (congestive) heart failure: Secondary | ICD-10-CM | POA: Diagnosis not present

## 2022-03-11 DIAGNOSIS — L89153 Pressure ulcer of sacral region, stage 3: Secondary | ICD-10-CM | POA: Diagnosis not present

## 2022-03-11 DIAGNOSIS — N186 End stage renal disease: Secondary | ICD-10-CM | POA: Diagnosis not present

## 2022-03-11 DIAGNOSIS — I10 Essential (primary) hypertension: Secondary | ICD-10-CM | POA: Diagnosis not present

## 2022-03-11 LAB — CBC
HCT: 24.6 % — ABNORMAL LOW (ref 36.0–46.0)
Hemoglobin: 7.1 g/dL — ABNORMAL LOW (ref 12.0–15.0)
MCH: 28.4 pg (ref 26.0–34.0)
MCHC: 28.9 g/dL — ABNORMAL LOW (ref 30.0–36.0)
MCV: 98.4 fL (ref 80.0–100.0)
Platelets: 286 10*3/uL (ref 150–400)
RBC: 2.5 MIL/uL — ABNORMAL LOW (ref 3.87–5.11)
RDW: 15.6 % — ABNORMAL HIGH (ref 11.5–15.5)
WBC: 10.4 10*3/uL (ref 4.0–10.5)
nRBC: 0 % (ref 0.0–0.2)

## 2022-03-11 LAB — BASIC METABOLIC PANEL
Anion gap: 12 (ref 5–15)
BUN: 36 mg/dL — ABNORMAL HIGH (ref 6–20)
CO2: 27 mmol/L (ref 22–32)
Calcium: 9 mg/dL (ref 8.9–10.3)
Chloride: 99 mmol/L (ref 98–111)
Creatinine, Ser: 11.29 mg/dL — ABNORMAL HIGH (ref 0.44–1.00)
GFR, Estimated: 4 mL/min — ABNORMAL LOW (ref >60)
Glucose, Bld: 93 mg/dL (ref 70–99)
Potassium: 4.4 mmol/L (ref 3.5–5.1)
Sodium: 138 mmol/L (ref 135–145)

## 2022-03-11 MED ORDER — HEPARIN SODIUM (PORCINE) 1000 UNIT/ML DIALYSIS
4000.0000 [IU] | Freq: Once | INTRAMUSCULAR | Status: AC
  Administered 2022-03-11: 4000 [IU] via INTRAVENOUS_CENTRAL

## 2022-03-11 MED ORDER — HEPARIN SODIUM (PORCINE) 1000 UNIT/ML IJ SOLN
INTRAMUSCULAR | Status: AC
  Administered 2022-03-11: 4200 [IU]
  Filled 2022-03-11: qty 4

## 2022-03-11 MED ORDER — HEPARIN SODIUM (PORCINE) 1000 UNIT/ML IJ SOLN
INTRAMUSCULAR | Status: AC
  Filled 2022-03-11: qty 5

## 2022-03-11 NOTE — Progress Notes (Addendum)
[facility] Progress Note   Subjective:  Seen in room - laying on stomach d/t side pain. Reports that chair specialist coming today to work with her. No CP/dyspnea. For HD later today. Says enema helped her constipation yesterday.  Objective Vitals:   03/10/22 0958 03/10/22 1603 03/10/22 2130 03/11/22 0543  BP: 135/72 (!) 161/76 (!) 140/62 (!) 147/74  Pulse: 73 72 78 75  Resp: _0 Temp: 98.1 F (36.7 C) 98 F (36.7 C) 98 F (36.7 C) 98.4 F (36.9 C)  TempSrc: Oral Oral Oral Oral  SpO2: 99% 100% 100% 100%  Weight:      Height:       Physical Exam General: Chronically ill appearing woman, NAD.  Heart: RRR; no murmur Lungs: CTAB; no rales Abdomen: obese; tender/firm purplish nodules to B abdomen - mostly bandaged. Extremities: B AKA; no stump edema Dialysis Access:  [facility] (s/p recent exchange)  Additional Objective Labs: Basic Metabolic Panel: Recent Labs  Lab 03/07/22 1837 03/08/22 1830  NA 137 136  K 4.9 4.9  CL 100 97*  CO2 24 24  GLUCOSE 84 78  BUN 44* 51*  CREATININE 10.74* 12.30*  CALCIUM 9.3 9.2  PHOS 5.9* 5.6*   Liver Function Tests: Recent Labs  Lab 03/07/22 1837 03/08/22 1830  ALBUMIN 2.6* 2.7*   CBC: Recent Labs  Lab 03/07/22 1837 03/08/22 1830  WBC 11.5* 12.2*  HGB 7.7* 7.5*  HCT 25.5* 24.6*  MCV 96.2 95.7  PLT 266 301   Blood Culture    Component Value Date/Time   SDES BLOOD LEFT ANTECUBITAL 10/16/2021 1534   SPECREQUEST  10/16/2021 1534    BOTTLES DRAWN AEROBIC AND ANAEROBIC Blood Culture results may not be optimal due to an inadequate volume of blood received in culture bottles   CULT  10/16/2021 1534    NO GROWTH 5 DAYS Performed at [facility], [place] [address], [place], [place] [address]    REPTSTATUS 10/21/2021 FINAL 10/16/2021 1534   Medications:  sodium thiosulfate 12.5 g in sodium chloride 0.9 % 150 mL 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 (03/08/22 2009)    aspirin EC  81 mg Oral Daily    atorvastatin  10 mg Oral Daily   Chlorhexidine Gluconate Cloth  6 each Topical Q0600   darbepoetin (ARANESP) injection - DIALYSIS  200 mcg Subcutaneous Q Tue-1800   docusate sodium  100 mg Oral BID   famotidine  20 mg Oral Daily   feeding supplement  1 Container Oral TID BM   gabapentin  100 mg Oral Q1200   gabapentin  200 mg Oral QHS   Gerhardt's butt cream   Topical BID   hydroquinone   Topical BID   lactulose  10 g Oral TID   levothyroxine  100 mcg Oral Q0600   loratadine  10 mg Oral Daily   melatonin  3 mg Oral QHS   midodrine  10 mg Oral Once per day on Tue Thu Sat   pantoprazole  40 mg Oral Daily   sevelamer carbonate  4.8 g Oral TID WC   sucroferric oxyhydroxide  500 mg Oral TID WC    Dialysis Orders: Was only dialyzed a few times as outpatient - no current OP spot at this time.   Assessment/Plan: Inability to obtain HD at OP center D/T body habitus and wound: Difficult situation with no clear answers yet. PT working with pt -  met with seating specialist to get evaluated for custom chair/cushion that  will allow her sit for longer periods.  Appreciate efforts from PT team.  ESRD: Usual TTS schedule - was refusing HD at times - has been discussed extensively. New TDC placed 02/04/2022, TDC stopped working again 11/30. IR consulted - S/p venogram, angioplasty of SVC and innominate veins, and TDC exchange. Next HD today (12/5). 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 Started empirically on sodium thiosulfate, but caused vomiting so stopped - now getting 1/2 dose - 12.29m q HD. Not on any calcium based products or vitamin D. Needs better control of Phos - have added Velphoro as well. HypoTN/volume: On midodrine 174mqHD, with 1079mrn mid HD w/parameters to keep SBP >100 d/t becoming symptomatic when it drops lower. Significant weight loss this admit. Severe ischial sacral decubitus wound: CT showing chronic  osteomyelitis. Limiting her, noted to be slow healing. Goal to tolerate sitting upright - which she is doing with PT.  ID started doxycycline which caused diarrhea.  COPD/Chronic O2 use: Prior O2 at home (5L/min) -> tapered to 2-3L/min here Anemia of ESRD: Hgb down to 7.5 - on max dose Aranesp 200m51meekly. Recent Fe load completed. Will check stool cards. Secondary hyperparathyroidism: Phos high again - ordered for Renvela 4.8g AC TID, refusing/missing at times. Add Velphoro 500mg59m as well. CorrCa elevated. Not on VDRA, last PTH 60 on 01/30/22 - will recheck. Avoid all calcium based medications for Vitamin D d/t concern for calciphylaxis.  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 Nutrition - diet continues to be changed from renal.  Currently on regular diet.  Should stay on renal diet - she is considering changing back.  Disposition: Difficult situation. Prior to admit, patient required 6 staff members and 2 hoyer pads for transfer into HD chair. Could not stay on HD for full tx. 2/2 pain of multiple decubitus ulcers and PTAR unable to lift pt into ambulance for transport.SW working on LTC at SNF. Con-wayl need to tolerate 5-6hrs in a chair to tolerate OP dialysis including transport, treatment and cannulation time.  The main issue is her Rt ischial wound. PT has arranged seating specialist with plan for custom power chair/cushion that would keep pressure off wounds and allow her to sit for longer periods   KatieVeneta PentonC 03/11/2022, 9:08 AM  [employer]

## 2022-03-11 NOTE — Progress Notes (Addendum)
Arrived in hemo unit ,in bed ,alert and oriented x 4.Denies pain.  Access used Left chest tunnel catheter.  Duration of treatment :4 hours.  Medicine given: Sodium Thiosulfate 12.5 g                            Heparin 4,000 units bolus.                            Oxycodone 10 mg .  Fluid removed:1,500 cc.  HD treatment issue:None.

## 2022-03-11 NOTE — Progress Notes (Signed)
PROGRESS NOTE  Courtney Gray UYQ:[id number] DOB: 25-Nov-1970 DOA: 11/01/2021 PCP: Monico Blitz, MD   LOS: 130 days   Brief Narrative / Interim history: Patient is a 51 year old female, chronically ill with obesity, bedbound status, ESRD, PVD status post right AKA, chronic hypoxic respiratory failure on home O2, sacral decubitus ulcer, bacteremia with hepatic and splenic abscess status post splenic and liver drain, third digit of left hand amputation and left AKA, now bilateral AKA,  presented to the hospital with progressive dyspnea on exertion in the setting of missed HD.  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  On 02/26/2022, patient was consulted for chronic osteomyelitis of the right ischium and was considered for weeks of antibiotic therapy.   Currently, patient is awaiting for placement at dialysis center when she can get to treatment but does not have accepting center so far.  Assesement and Plan: Principal Problem:   Acute on chronic systolic CHF (congestive heart failure) (HCC) Active Problems:   End stage renal disease (HCC)   Hypertension   Decubitus ulcer of sacral region, stage 3 (HCC)   Type 2 diabetes mellitus with hyperlipidemia (HCC)   Chronic respiratory failure with hypoxia (HCC)   Bacteremia   PVD (peripheral vascular disease) (HCC)   Class 3 obesity (HCC)   Anemia due to chronic kidney disease   Acute on chronic diastolic CHF (congestive heart failure) (HCC)   Hypertensive emergency   Leukocytosis   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HCC)   Pressure injury of skin   PAD (peripheral artery disease) (HCC)   Irritable bowel syndrome (IBS)   Amputation of left middle finger   GERD (gastroesophageal reflux disease)   Nodule of skin of abdomen  End stage renal disease with volume overload- Nephrology on board.  Currently undergoing hemodialysis but unable to sit in a chair.  Recent  tunnel catheter exchange by IR for catheter malfunction.  History of balloon angioplasty of SVC and intermittent veins.  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  Received sodium thiosulfate on 03/09/2022.  Still complains of burning pain around the nodular area  Deep sacral decubitus wound, early osteomyelitis of right ischium -deep wound without overt infection.  CT abdomen pelvis 02/17/22 with some signs of osteomyelitis near right ischium, suspected to chronic osteomyelitis.  ID was consulted and patient was initially on doxycycline which she could not tolerate so has been discontinued.  Watchful waiting has been pursued.   Anemia of chronic kidney disease -hemoglobin of 7.5.  Overall stable.  Received erythropoietin and iron.   Electrolyte imbalances-corrected with renal replacement therapy.  Latest potassium of 4.9.   Hypotension -On midodrine with HD. Currently off antihypertensive medications.    History of IBS constipation improved with lactulose.  Type 2 diabetes mellitus with hyperlipidemia (HCC) -Stable  PVD (peripheral vascular disease) (HCC) -Bilateral AKA.  Continue supportive care.   COPD -stable on home oxygen at 2 L/min.   Class 3 obesity (HCC) -Body mass index is 45.6 kg/m.  Patient will benefit from weight loss as outpatient.    chronic diastolic CHF (congestive heart failure) ([place]) on hemodialysis for volume management.   Scheduled Meds:  aspirin EC  81 mg Oral Daily   atorvastatin  10 mg Oral Daily   Chlorhexidine Gluconate Cloth  6 each Topical Q0600   darbepoetin (ARANESP) injection - DIALYSIS  200 mcg Subcutaneous Q Tue-1800   docusate sodium  100 mg Oral BID   famotidine  20 mg Oral Daily   feeding supplement  1 Container Oral TID BM   gabapentin  100 mg Oral Q1200   gabapentin  200 mg Oral QHS   Gerhardt's butt  cream   Topical BID   heparin  4,000 Units Dialysis Once in dialysis   heparin sodium (porcine)       hydroquinone   Topical BID   lactulose  10 g Oral TID   levothyroxine  100 mcg Oral Q0600   loratadine  10 mg Oral Daily   melatonin  3 mg Oral QHS   midodrine  10 mg Oral Once per day on Tue Thu Sat   pantoprazole  40 mg Oral Daily   sevelamer carbonate  4.8 g Oral TID WC   sucroferric oxyhydroxide  500 mg Oral TID WC   Continuous Infusions:  sodium thiosulfate 12.5 g in sodium chloride 0.9 % 150 mL 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  Unable to put SCDs.  Patient has had multiple knots in her abdomen and is not willing to continue more heparin shots.  Will review in few days.    Code Status: Full Code  Family Communication:  Daughter at bedside  Status is: Inpatient  Remains inpatient appropriate because:  disposition issues, difficulty with hemodialysis chair  Consultants:  Nephrology  Subjective   Today, patient was seen and examined at bedside.  Still complains of burning sensation over the abdominal wall and gluteal area at the site of calciphylaxis.  Had constipation which has improved after Dulcolax suppository.  Objective: Vitals:   03/10/22 0958 03/10/22 1603 03/10/22 2130 03/11/22 0543  BP: 135/72 (!) 161/76 (!) 140/62 (!) 147/74  Pulse: 73 72 78 75  Resp: 18 16 18 16   Temp: 98.1 F (36.7 C) 98 F (36.7 C) 98 F (36.7 C) 98.4 F (36.9 C)  TempSrc: Oral Oral Oral Oral  SpO2: 99% 100% 100% 100%  Weight:      Height:        Intake/Output Summary (Last 24 hours) at 03/11/2022 1327 Last data filed at 03/11/2022 0849 Gross per 24 hour  Intake 440 ml  Output 0 ml  Net 440 ml    Wt Readings from Last 3 Encounters:  03/10/22 124.3 kg  10/21/21 (!) 144 kg  09/03/21 (!) 150.6 kg    Physical examination:  Body mass index is 45.6 kg/m.   General: Alert awake and oriented, appears older than stated age, on  nasal cannula oxygen, morbidly obese.  HENT:   No scleral pallor or icterus noted. Oral mucosa is moist.  Chest: Diminished breath sounds bilaterally.  No wheezes or crackles. CVS: S1 &S2 heard. No murmur.  Regular rate and rhythm. Abdomen: Soft, nontender, obese, dressing noted over the nodular ulcerated area Extremities: Bilateral above-knee amputation.   Psych: Alert, awake and oriented, normal mood CNS:  No cranial nerve deficits.  Skin: Warm and dry.  Sacral decubitus ulceration, bilateral above-knee amputation.  Dressing noted over the abdomen.  Data Reviewed: I have independently reviewed following labs and imaging studies   CBC Recent Labs  Lab 03/07/22 1837 03/08/22 1830  WBC 11.5* 12.2*  HGB 7.7* 7.5*  HCT 25.5* 24.6*  PLT 266 301  MCV 96.2 95.7  MCH 29.1 29.2  MCHC 30.2 30.5  RDW 15.1 15.2     Recent Labs  Lab  03/07/22 1837 03/08/22 1830  NA 137 136  K 4.9 4.9  CL 100 97*  CO2 24 24  GLUCOSE 84 78  BUN 44* 51*  CREATININE 10.74* 12.30*  CALCIUM 9.3 9.2  ALBUMIN 2.6* 2.7*     ------------------------------------------------------------------------------------------------------------------ No results for input(s): "CHOL", "HDL", "LDLCALC", "TRIG", "CHOLHDL", "LDLDIRECT" in the last 72 hours.  Lab Results  Component Value Date   HGBA1C 5.3 12/19/2021   ------------------------------------------------------------------------------------------------------------------ No results for input(s): "TSH", "T4TOTAL", "T3FREE", "THYROIDAB" in the last 72 hours.  Invalid input(s): "FREET3"  Cardiac Enzymes No results for input(s): "CKMB", "TROPONINI", "MYOGLOBIN" in the last 168 hours.  Invalid input(s): "CK" ------------------------------------------------------------------------------------------------------------------    Component Value Date/Time   BNP 1,414.1 (H) 10/27/2021 0016    CBG: No results for input(s): "GLUCAP" in the last 168  hours.   No results found for this or any previous visit (from the past 240 hour(s)).   Radiology Studies: No results found.   Flora Lipps, MD [facility]

## 2022-03-12 DIAGNOSIS — I5023 Acute on chronic systolic (congestive) heart failure: Secondary | ICD-10-CM | POA: Diagnosis not present

## 2022-03-12 LAB — PARATHYROID HORMONE, INTACT (NO CA): PTH: 86 pg/mL — ABNORMAL HIGH (ref 15–65)

## 2022-03-12 LAB — PREPARE RBC (CROSSMATCH)

## 2022-03-12 MED ORDER — SODIUM CHLORIDE 0.9% IV SOLUTION: Freq: Once | INTRAVENOUS | Status: DC

## 2022-03-12 NOTE — Progress Notes (Signed)
PROGRESS NOTE    Courtney Gray  WUJ:[id number] DOB: [birth year] DOA: 11/01/2021 PCP: Monico Blitz, MD   Brief Narrative:  This 51 year old female, chronically ill with Morbid obesity, bedbound status, ESRD, PVD status post right AKA, chronic hypoxic respiratory failure on home O2, sacral decubitus ulcer, bacteremia with hepatic and splenic abscess status post splenic and liver drain, third digit of left hand amputation and left AKA, now bilateral AKA, presented to the hospital with progressive dyspnea on exertion in the setting of missed HD.  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    Currently, patient is awaiting placement at dialysis center where she can get to treatment but does not have accepting center so far.  Assessment & Plan:   Principal Problem:   Acute on chronic systolic CHF (congestive heart failure) (HCC) Active Problems:   End stage renal disease (HCC)   Hypertension   Decubitus ulcer of sacral region, stage 3 (HCC)   Type 2 diabetes mellitus with hyperlipidemia (HCC)   Chronic respiratory failure with hypoxia (HCC)   Bacteremia   PVD (peripheral vascular disease) (HCC)   Class 3 obesity (HCC)   Anemia due to chronic kidney disease   Acute on chronic diastolic CHF (congestive heart failure) (HCC)   Hypertensive emergency   Leukocytosis   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HCC)   Pressure injury of skin   PAD (peripheral artery disease) (HCC)   Irritable bowel syndrome (IBS)   Amputation of left middle finger   GERD (gastroesophageal reflux disease)   Nodule of skin of abdomen  End-stage renal disease with volume overload: Patient is currently undergoing hemodialysis but unable to sit in the chair for full treatment. Nephrology is following. She recently underwent tunnel catheter exchange by IR for catheter malfunction.   She need to tolerate 5 to 6 hours in the chair to tolerate  outpatient dialysis. PT in contact with seating specialist with the plan for custom power chair/cushion that will keep pressure off wounds and allowed her to sit for longer periods.   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   Received sodium thiosulfate on 03/09/2022.  She still complains of burning pain around the nodular areas.     Deep sacral decubitus wound, early osteomyelitis of right ischium: She is found to have deep wound without overt infection.   CT abdomen pelvis 02/17/22 with some signs of osteomyelitis near right ischium, suspected to chronic osteomyelitis.   ID consulted and patient was started on doxycycline which she could not tolerate.  Doxycycline discontinued.  Watchful waiting has been pursued.   Anemia of chronic kidney disease : Hemoglobin 7.5.  Overall stable.  Received erythropoietin and iron.  Electrolyte imbalance:  Corrected with renal replacement therapy.  Latest potassium 4.4  Hypotension: Continue midodrine with hemodialysis. Currently off antihypertensive medications.  History. of IBS: Constipation improved with lactulose.  Type 2 diabetes with hyperlipidemia: Stable.   Peripheral vascular disease /Bilateral BKA Continue supportive care.  COPD: Stable on home inhalers.   Continue supplemental oxygen at baseline.  Morbid obesity: Patient would benefit from weight loss as an outpatient. Estimated body mass index is 45.6 kg/m as calculated from the following:   Height as of this encounter: 5\' 5"  (1.651 m).   Weight as of this encounter: 124.3 kg.    Chronic Diastolic CHF: On hemodialysis for volume  management.    DVT prophylaxis: Unable to put SCDs.  Patient has multiple swellings in her abdomen and is not willing to continue heparin shots. Code Status: Full code Family Communication: No family at  bed side. Disposition Plan:   Status is: Inpatient Remains inpatient appropriate because: Disposition issues, difficulty with hemodialysis chair.     Consultants:  Nephrology  Procedures: None.  Antimicrobials:  Anti-infectives (From admission, onward)    Start     Dose/Rate Route Frequency Ordered Stop   03/08/22 0045  vancomycin (VANCOCIN) IVPB 1000 mg/200 mL premix  Status:  Discontinued       Note to Pharmacy: Give in IR for surgical prophylaxis   1,000 mg 200 mL/hr over 60 Minutes Intravenous  Once 03/07/22 2345 03/07/22 2349   03/07/22 1001  vancomycin (VANCOCIN) IVPB 1000 mg/200 mL premix        over 60 Minutes Intravenous Continuous PRN 03/07/22 1027 03/07/22 1108   03/07/22 0953  vancomycin (VANCOCIN) 1-5 GM/200ML-% IVPB       Note to Pharmacy: Kandy Garrison J: cabinet override      03/07/22 0953 03/07/22 2159   03/04/22 1700  doxycycline (VIBRA-TABS) tablet 100 mg  Status:  Discontinued        100 mg Oral 2 times daily with meals 03/04/22 1519 03/07/22 1348   02/04/22 1637  vancomycin (VANCOCIN) 1-5 GM/200ML-% IVPB       Note to Pharmacy: Roe Coombs H: cabinet override      02/04/22 1637 02/04/22 1758   01/06/22 0600  vancomycin (VANCOCIN) IVPB 1000 mg/200 mL premix        1,000 mg 200 mL/hr over 60 Minutes Intravenous To Surgery 01/05/22 0709 01/07/22 0600   11/28/21 1215  vancomycin (VANCOCIN) IVPB 1000 mg/200 mL premix        1,000 mg 200 mL/hr over 60 Minutes Intravenous  Once 11/28/21 1117 11/28/21 1512   11/26/21 1000  vancomycin (VANCOCIN) IVPB 1000 mg/200 mL premix        1,000 mg 200 mL/hr over 60 Minutes Intravenous On call 11/25/21 1628 11/26/21 1740   11/04/21 1800  ceFEPIme (MAXIPIME) 2 g in sodium chloride 0.9 % 100 mL IVPB        2 g 200 mL/hr over 30 Minutes Intravenous Every M-W-F (1800) 11/03/21 0952 11/08/21 2359   11/04/21 1200  ceFEPIme (MAXIPIME) 2 g in sodium chloride 0.9 % 100 mL IVPB  Status:  Discontinued        2 g 200 mL/hr  over 30 Minutes Intravenous Every M-W-F (Hemodialysis) 11/02/21 0941 11/03/21 0952   11/02/21 1800  ceFEPIme (MAXIPIME) 2 g in sodium chloride 0.9 % 100 mL IVPB        2 g 200 mL/hr over 30 Minutes Intravenous  Once 11/02/21 0941 11/02/21 1812   11/02/21 1200  ceFEPIme (MAXIPIME) 2 g in sodium chloride 0.9 % 100 mL IVPB  Status:  Discontinued        2 g 200 mL/hr over 30 Minutes Intravenous Every T-Th-Sa (Hemodialysis) 11/01/21 1934 11/02/21 0941   11/01/21 2200  metroNIDAZOLE (FLAGYL) tablet 500 mg        500 mg Oral Every 12 hours 11/01/21 1827 11/08/21 2225   11/01/21 1945  ceFEPIme (MAXIPIME) 1 g in sodium chloride 0.9 % 100 mL IVPB        1 g 200 mL/hr over 30 Minutes Intravenous  Once 11/01/21 1933 11/01/21 2032   11/01/21 1730  ceFEPIme (MAXIPIME) 1 g in sodium chloride  0.9 % 100 mL IVPB  Status:  Discontinued        1 g 200 mL/hr over 30 Minutes Intravenous Every 24 hours 11/01/21 1720 11/01/21 1933       Subjective: Patient was seen and examined at bedside.  No overnight events noted. She still complains of burning sensation over abdominal wall and at the site of calciphylaxis. Patient reports having constipation.  Objective: Vitals:   03/11/22 1700 03/11/22 1727 03/11/22 2106 03/12/22 0900  BP: (!) 158/78 (!) 120/41 (!) 135/51 (!) 141/74  Pulse: 67 70 71 70  Resp: 14 (!) 21 17 16   Temp:  97.6 F (36.4 C) 98.6 F (37 C) 98.7 F (37.1 C)  TempSrc:  Oral Oral Oral  SpO2: 100% (!) 78% 100% 100%  Weight:      Height:        Intake/Output Summary (Last 24 hours) at 03/12/2022 1215 Last data filed at 03/12/2022 0700 Gross per 24 hour  Intake 680 ml  Output 1500 ml  Net -820 ml   Filed Weights   03/10/22 0500  Weight: 124.3 kg    Examination:  General exam: Appears comfortable, not in any acute distress.  Deconditioned. Respiratory system: CTA bilaterally, respiratory effort normal, RR 15. Cardiovascular system: S1 & S2 heard, regular rate and rhythm, no  murmur. Gastrointestinal system: Abdomen soft, nontender, obese,  nodular ulcerated areas covered with dressing. Central nervous system: Alert and oriented x 3. No focal neurological deficits. Extremities: Bilateral above-knee amputation. Skin: No rashes, lesions or ulcers Psychiatry: Judgement and insight appear normal. Mood & affect appropriate.     Data Reviewed: I have personally reviewed following labs and imaging studies  CBC: Recent Labs  Lab 03/07/22 1837 03/08/22 1830 03/11/22 1300  WBC 11.5* 12.2* 10.4  HGB 7.7* 7.5* 7.1*  HCT 25.5* 24.6* 24.6*  MCV 96.2 95.7 98.4  PLT 266 301 010   Basic Metabolic Panel: Recent Labs  Lab 03/07/22 1837 03/08/22 1830 03/11/22 1300  NA 137 136 138  K 4.9 4.9 4.4  CL 100 97* 99  CO2 24 24 27   GLUCOSE 84 78 93  BUN 44* 51* 36*  CREATININE 10.74* 12.30* 11.29*  CALCIUM 9.3 9.2 9.0  PHOS 5.9* 5.6*  --    GFR: Estimated Creatinine Clearance: 7.8 mL/min (A) (by C-G formula based on SCr of 11.29 mg/dL (H)). Liver Function Tests: Recent Labs  Lab 03/07/22 1837 03/08/22 1830  ALBUMIN 2.6* 2.7*   No results for input(s): "LIPASE", "AMYLASE" in the last 168 hours. No results for input(s): "AMMONIA" in the last 168 hours. Coagulation Profile: No results for input(s): "INR", "PROTIME" in the last 168 hours. Cardiac Enzymes: No results for input(s): "CKTOTAL", "CKMB", "CKMBINDEX", "TROPONINI" in the last 168 hours. BNP (last 3 results) No results for input(s): "PROBNP" in the last 8760 hours. HbA1C: No results for input(s): "HGBA1C" in the last 72 hours. CBG: No results for input(s): "GLUCAP" in the last 168 hours. Lipid Profile: No results for input(s): "CHOL", "HDL", "LDLCALC", "TRIG", "CHOLHDL", "LDLDIRECT" in the last 72 hours. Thyroid Function Tests: No results for input(s): "TSH", "T4TOTAL", "FREET4", "T3FREE", "THYROIDAB" in the last 72 hours. Anemia Panel: No results for input(s): "VITAMINB12", "FOLATE", "FERRITIN",  "TIBC", "IRON", "RETICCTPCT" in the last 72 hours. Sepsis Labs: No results for input(s): "PROCALCITON", "LATICACIDVEN" in the last 168 hours.  No results found for this or any previous visit (from the past 240 hour(s)).   Radiology Studies: No results found.  Scheduled Meds:  sodium  chloride   Intravenous Once   aspirin EC  81 mg Oral Daily   atorvastatin  10 mg Oral Daily   Chlorhexidine Gluconate Cloth  6 each Topical Q0600   darbepoetin (ARANESP) injection - DIALYSIS  200 mcg Subcutaneous Q Tue-1800   docusate sodium  100 mg Oral BID   famotidine  20 mg Oral Daily   feeding supplement  1 Container Oral TID BM   gabapentin  100 mg Oral Q1200   gabapentin  200 mg Oral QHS   Gerhardt's butt cream   Topical BID   hydroquinone   Topical BID   lactulose  10 g Oral TID   levothyroxine  100 mcg Oral Q0600   loratadine  10 mg Oral Daily   melatonin  3 mg Oral QHS   midodrine  10 mg Oral Once per day on Tue Thu Sat   pantoprazole  40 mg Oral Daily   sevelamer carbonate  4.8 g Oral TID WC   sucroferric oxyhydroxide  500 mg Oral TID WC   Continuous Infusions:  sodium thiosulfate 12.5 g in sodium chloride 0.9 % 150 mL Infusion for Calciphylaxis Stopped (03/11/22 1717)     LOS: 131 days    Time spent: 50 mins    Saranda Legrande, MD [facility]   If 7PM-7AM, please contact night-coverage HistConstipation improved with lactulose. history of irritable bowel syndrome history of irritable bowel syndromeory of irritable bowel syndrome

## 2022-03-12 NOTE — Progress Notes (Signed)
[facility] Progress Note   Subjective:   Seen in room - HD went fine yesterday, 1.5L removed. Denies CP/dyspnea. Ongoing pain with abdominal lesions.  Objective Vitals:   03/11/22 1700 03/11/22 1727 03/11/22 2106 03/12/22 0900  BP: (!) 158/78 (!) 120/41 (!) 135/51 (!) 141/74  Pulse: 67 70 71 70  Resp: 14 (!) 21 17 16  Temp:  97.6 F (36.4 C) 98.6 F (37 C) 98.7 F (37.1 C)  TempSrc:  Oral Oral Oral  SpO2: 100% (!) 78% 100% 100%  Weight:      Height:       Physical Exam General: Chronically ill appearing woman, NAD.  Heart: RRR; no murmur Lungs: CTAB; no rales Abdomen: obese; tender/firm purplish nodules to B abdomen - mostly bandaged. Extremities: B AKA; no stump edema Dialysis Access:  TDC (s/p recent exchange)  Additional Objective Labs: Basic Metabolic Panel: Recent Labs  Lab 03/07/22 1837 03/08/22 1830 03/11/22 1300  NA 137 136 138  K 4.9 4.9 4.4  CL 100 97* 99  CO2 24 24 27  GLUCOSE 84 78 93  BUN 44* 51* 36*  CREATININE 10.74* 12.30* 11.29*  CALCIUM 9.3 9.2 9.0  PHOS 5.9* 5.6*  --    Liver Function Tests: Recent Labs  Lab 03/07/22 1837 03/08/22 1830  ALBUMIN 2.6* 2.7*   CBC: Recent Labs  Lab 03/07/22 1837 03/08/22 1830 03/11/22 1300  WBC 11.5* 12.2* 10.4  HGB 7.7* 7.5* 7.1*  HCT 25.5* 24.6* 24.6*  MCV 96.2 95.7 98.4  PLT 266 301 286   Medications:  sodium thiosulfate 12.5 g in sodium chloride 0.9 % 150 mL Infusion for Calciphylaxis Stopped (03/11/22 1717)    aspirin EC  81 mg Oral Daily   atorvastatin  10 mg Oral Daily   Chlorhexidine Gluconate Cloth  6 each Topical Q0600   darbepoetin (ARANESP) injection - DIALYSIS  200 mcg Subcutaneous Q Tue-1800   docusate sodium  100 mg Oral BID   famotidine  20 mg Oral Daily   feeding supplement  1 Container Oral TID BM   gabapentin  100 mg Oral Q1200   gabapentin  200 mg Oral QHS   Gerhardt's butt cream   Topical BID   hydroquinone   Topical BID   lactulose  10 g Oral TID    levothyroxine  100 mcg Oral Q0600   loratadine  10 mg Oral Daily   melatonin  3 mg Oral QHS   midodrine  10 mg Oral Once per day on Tue Thu Sat   pantoprazole  40 mg Oral Daily   sevelamer carbonate  4.8 g Oral TID WC   sucroferric oxyhydroxide  500 mg Oral TID WC    Dialysis Orders: Was only dialyzed a few times as outpatient - no current OP spot at this time.   Assessment/Plan: Inability to obtain HD at OP center D/T body habitus and wound: Difficult situation with no clear answers yet. PT working with pt -  met with seating specialist to get evaluated for custom chair/cushion that will allow her sit for longer periods.  Appreciate efforts from PT team.  ESRD: Usual TTS schedule - was refusing HD at times - has been discussed extensively. New TDC placed 02/04/2022, TDC stopped working again 11/30. IR consulted - S/p venogram, angioplasty of SVC and innominate veins, and TDC exchange. Next HD tomorrow. 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 Started empirically on sodium thiosulfate, but caused vomiting so   stopped - now getting 1/2 dose - 12.5mg q HD. Not on any calcium based products or vitamin D. Needs better control of Phos - have added Velphoro as well. HypoTN/volume: On midodrine 10mg qHD, with 10mg prn mid HD w/parameters to keep SBP >100 d/t becoming symptomatic when it drops lower. Significant weight loss this admit. Severe ischial sacral decubitus wound: CT showing chronic osteomyelitis. Limiting her, noted to be slow healing. Goal to tolerate sitting upright - which she is doing with PT.  ID started doxycycline which caused diarrhea - now off.  COPD/Chronic O2 use: Prior O2 at home (5L/min) -> tapered to 2-3L/min here Anemia of ESRD: Hgb down to 7.1 - on max dose Aranesp 200mcg weekly. Recent Fe load completed. She denies GI losses, stool cards ordered. Will plan 1U PRBCs with next HD - she would like to pray  on it which is perfectly fine. Secondary hyperparathyroidism: Phos high again - ordered for Renvela 4.8g AC TID, refusing/missing at times. Add Velphoro 500mg TID as well. CorrCa elevated. Not on VDRA, last PTH 60 on 01/30/22 - will recheck. Avoid all calcium based medications for Vitamin D d/t concern for calciphylaxis.  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 Nutrition - diet continues to be changed from renal.  Currently on regular diet.  Should stay on renal diet - she is considering changing back.  Disposition: Difficult situation. Prior to admit, patient required 6 staff members and 2 hoyer pads for transfer into HD chair. Could not stay on HD for full tx. 2/2 pain of multiple decubitus ulcers and PTAR unable to lift pt into ambulance for transport.SW working on LTC at SNF. Will need to tolerate 5-6hrs in a chair to tolerate OP dialysis including transport, treatment and cannulation time.  The main issue is her Rt ischial wound. PT has arranged seating specialist with plan for custom power chair/cushion that would keep pressure off wounds and allow her to sit for longer periods     Katie , PA-C 03/12/2022, 10:54 AM  [facility]    

## 2022-03-12 NOTE — Progress Notes (Signed)
Physical Therapy Note  Scheduled appt with [name], BS, ATP for demo specialty wc check for Tuesday, Dec. 12 at 13:30  Roney Marion, [place] Office [phone]

## 2022-03-12 NOTE — Progress Notes (Signed)
Per [place], PA patient will receive one unit of blood in hemodialysis tomorrow, 12/7. Patient is refusing to sign blood consent form at this time.

## 2022-03-12 NOTE — Progress Notes (Signed)
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    PT Comments    Continuing work on functional mobility and activity tolerance;  Session consisted of discussion re: benefits of mobility, but ultimately pt declined any physical practice for transfers, or exercise;   Informed pt that appt with Corene Cornea, ATP with [facility], was rescheduled for next Tuesday.   Recommendations for follow up therapy are one component of a multi-disciplinary discharge planning process, led by the attending physician.  Recommendations may be updated based on patient status, additional functional criteria and insurance authorization.  Follow Up Recommendations  Skilled nursing-short term rehab (<3 hours/day) Can patient physically be transported by private vehicle: No   Assistance Recommended at Discharge Intermittent Supervision/Assistance  Patient can return home with the following A lot of help with walking and/or transfers;A lot of help with bathing/dressing/bathroom;Assist for transportation;Help with stairs or ramp for entrance   Equipment Recommendations  Wheelchair (measurements PT);Wheelchair cushion (measurements PT);Other (comment);Hospital bed (more accomodating mechanical lift, with sling that handles pt's with bil AKAs; Roho cushion (20x24), power wheelchair with power tilt and recline)    Recommendations for Other Services       Precautions / Restrictions Precautions Precautions: Fall Precaution Comments: bil AKA, 2L O2 at baseline; when in poer chair, MUST perform  pressure relief every 30 minutes by tilting back for 10 minutes Restrictions RUE Weight Bearing: Weight bearing as tolerated LUE Weight Bearing: Weight bearing as tolerated RLE Weight Bearing: Non weight bearing LLE Weight Bearing: Non weight bearing Other Position/Activity Restrictions: when in power chair, MUST perform pressure relief every 30 minutes by tilting back for 10 minutes     Mobility  Bed Mobility Overal bed mobility: Modified Independent Bed Mobility: Rolling Rolling: Modified independent (Device/Increase time)   Supine to sit: Modified independent (Device/Increase time) Sit to supine: Modified independent (Device/Increase time)   General bed mobility comments: pulls on rails to sit upright in bed    Transfers                   General transfer comment: Discussion re: benefits of mobility, even when consitpated; pt declined getting to the power chair, stating she felt too weak; declined using the [facility] lift as well    Ambulation/Gait                   Stairs             Wheelchair Mobility    Modified Rankin (Stroke Patients Only)       Balance                                            Cognition Arousal/Alertness: Awake/alert Behavior During Therapy: Flat affect Overall Cognitive Status: Within Functional Limits for tasks assessed (for simple tasks)                                 General  Comments: Concern for pt remembering to perform pressure relief and following through with pressure relief        Exercises      General Comments General comments (skin integrity, edema, etc.): Session was largely discussion; Provided pt with active listening, finding out how she is doing; When able, described to pt how different PTs have different styles, and mobility is important and we need to continue to offer opportunities for mobility to her; we discussed doing therex in the bed and she declines.       Pertinent Vitals/Pain Pain Assessment Pain Assessment: Faces Faces Pain Scale: Hurts a little bit Pain Location: Discomfort related to constipation Pain Descriptors / Indicators: Grimacing, Discomfort Pain Intervention(s): Limited activity within patient's tolerance    Home Living                          Prior Function            PT Goals (current goals can now be found in the care plan section) Acute Rehab PT Goals Patient Stated Goal: Did not state today, except she wants to get these constipation/diarrhea issues she has experienced over the past few weeks done with PT Goal Formulation: With patient Time For Goal Achievement: 03/11/22 Potential to Achieve Goals: Good Progress towards PT goals:  (Limited today)    Frequency    Min 2X/week      PT Plan Current plan remains appropriate    Co-evaluation              AM-PAC PT "6 Clicks" Mobility   Outcome Measure  Help needed turning from your back to your side while in a flat bed without using bedrails?: None Help needed moving from lying on your back to sitting on the side of a flat bed without using bedrails?: None Help needed moving to and from a bed to a chair (including a wheelchair)?: A Lot Help needed standing up from a chair using your arms (e.g., wheelchair or bedside chair)?: Total Help needed to walk in hospital room?: Total Help needed climbing 3-5 steps with a railing? : Total 6 Click Score: 13    End of Session Equipment Utilized During Treatment: Oxygen Activity Tolerance: Other (comment) (limited by constipation) Patient left: in bed;with call bell/phone within reach Nurse Communication: Mobility status;Other (comment) (Need for more time and practice in the power chair, rationale for getting up even when pt would rather stay in bed) PT Visit Diagnosis: Other abnormalities of gait and mobility (R26.89);Muscle weakness (generalized) (M62.81) Pain - Right/Left: Right Pain - part of  body: Hip;Leg (ischial tuberosity; R abdominal nodular pain)     Time: 3295-1884 PT Time Calculation (min) (ACUTE ONLY): 37 min  Charges:  $Self Care/Home Management: [place] Office [place] 03/12/2022, 6:01 PM

## 2022-03-13 DIAGNOSIS — I5023 Acute on chronic systolic (congestive) heart failure: Secondary | ICD-10-CM | POA: Diagnosis not present

## 2022-03-13 LAB — HEMOGLOBIN AND HEMATOCRIT, BLOOD
HCT: 24.7 % — ABNORMAL LOW (ref 36.0–46.0)
Hemoglobin: 7.2 g/dL — ABNORMAL LOW (ref 12.0–15.0)

## 2022-03-13 MED ORDER — HEPARIN SODIUM (PORCINE) 1000 UNIT/ML IJ SOLN
INTRAMUSCULAR | Status: AC
  Filled 2022-03-13: qty 4

## 2022-03-13 NOTE — Plan of Care (Signed)

## 2022-03-13 NOTE — Progress Notes (Signed)
PROGRESS NOTE    Courtney Gray  GLO:[id number] DOB: June 26, 1970 DOA: 11/01/2021 PCP: Monico Blitz, MD   Brief Narrative:  This 51 year old female, chronically ill with Morbid obesity, bedbound status, ESRD, PVD status post right AKA, chronic hypoxic respiratory failure on home O2, sacral decubitus ulcer, bacteremia with hepatic and splenic abscess status post splenic and liver drain, third digit of left hand amputation and left AKA, now bilateral AKA, presented to the hospital with progressive dyspnea on exertion in the setting of missed HD.  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    Currently, patient is awaiting placement at dialysis center where she can get to treatment but does not have accepting center so far.  Assessment & Plan:   Principal Problem:   Acute on chronic systolic CHF (congestive heart failure) (HCC) Active Problems:   End stage renal disease (HCC)   Hypertension   Decubitus ulcer of sacral region, stage 3 (HCC)   Type 2 diabetes mellitus with hyperlipidemia (HCC)   Chronic respiratory failure with hypoxia (HCC)   Bacteremia   PVD (peripheral vascular disease) (HCC)   Class 3 obesity (HCC)   Anemia due to chronic kidney disease   Acute on chronic diastolic CHF (congestive heart failure) (HCC)   Hypertensive emergency   Leukocytosis   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HCC)   Pressure injury of skin   PAD (peripheral artery disease) (HCC)   Irritable bowel syndrome (IBS)   Amputation of left middle finger   GERD (gastroesophageal reflux disease)   Nodule of skin of abdomen  End-stage renal disease with volume overload: Patient is currently undergoing hemodialysis but unable to sit in the chair for full treatment. Nephrology is following. She recently underwent tunnel catheter exchange by IR for catheter malfunction.   She need to tolerate 5 to 6 hours in the chair to tolerate  outpatient dialysis. PT in contact with seating specialist with the plan for custom power chair/cushion that will keep pressure off wounds and allowed her to sit for longer periods.   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   Received sodium thiosulfate on 03/09/2022.  She still complains of burning pain around the nodular areas.     Deep sacral decubitus wound, early osteomyelitis of right ischium: She is found to have deep wound without overt infection.   CT abdomen pelvis 02/17/22 with some signs of osteomyelitis near right ischium, suspected to chronic osteomyelitis.   ID consulted and patient was started on doxycycline which she could not tolerate.  Doxycycline discontinued.  Watchful waiting has been pursued.   Anemia of chronic kidney disease : Hemoglobin 7.5>7.1.  Overall stable.  She received erythropoietin and iron. Transfuse If Hb drops below 7.0  Electrolyte imbalance:  Corrected with renal replacement therapy.  Latest potassium 4.4  Hypotension: Continue midodrine with hemodialysis. Currently off antihypertensive medications.  History of IBS: Constipation improved with lactulose.  Type 2 diabetes with hyperlipidemia: Stable. Continue sliding scale.   Peripheral vascular disease /Bilateral BKA Continue supportive care.  COPD: Stable on home inhalers.   Continue supplemental oxygen at baseline.  Morbid obesity: Patient would benefit from weight loss as an outpatient. Estimated body mass index is 45.6 kg/m as calculated from the following:   Height as of this encounter: 5\' 5"  (1.651 m).   Weight as of this encounter: 124.3 kg.  Chronic Diastolic CHF: On hemodialysis for volume management.    DVT prophylaxis: Unable to put SCDs.  Patient has multiple swellings in her abdomen and is not willing to continue heparin  shots.  Code Status: Full code Family Communication: No family at bed side. Disposition Plan:   Status is: Inpatient Remains inpatient appropriate because: Disposition issues, difficulty with hemodialysis chair.   Consultants:  Nephrology ID  Procedures: None.  Antimicrobials:  Anti-infectives (From admission, onward)    Start     Dose/Rate Route Frequency Ordered Stop   03/08/22 0045  vancomycin (VANCOCIN) IVPB 1000 mg/200 mL premix  Status:  Discontinued       Note to Pharmacy: Give in IR for surgical prophylaxis   1,000 mg 200 mL/hr over 60 Minutes Intravenous  Once 03/07/22 2345 03/07/22 2349   03/07/22 1001  vancomycin (VANCOCIN) IVPB 1000 mg/200 mL premix        over 60 Minutes Intravenous Continuous PRN 03/07/22 1027 03/07/22 1108   03/07/22 0953  vancomycin (VANCOCIN) 1-5 GM/200ML-% IVPB       Note to Pharmacy: Kandy Garrison J: cabinet override      03/07/22 0953 03/07/22 2159   03/04/22 1700  doxycycline (VIBRA-TABS) tablet 100 mg  Status:  Discontinued        100 mg Oral 2 times daily with meals 03/04/22 1519 03/07/22 1348   02/04/22 1637  vancomycin (VANCOCIN) 1-5 GM/200ML-% IVPB       Note to Pharmacy: Roe Coombs H: cabinet override      02/04/22 1637 02/04/22 1758   01/06/22 0600  vancomycin (VANCOCIN) IVPB 1000 mg/200 mL premix        1,000 mg 200 mL/hr over 60 Minutes Intravenous To Surgery 01/05/22 0709 01/07/22 0600   11/28/21 1215  vancomycin (VANCOCIN) IVPB 1000 mg/200 mL premix        1,000 mg 200 mL/hr over 60 Minutes Intravenous  Once 11/28/21 1117 11/28/21 1512   11/26/21 1000  vancomycin (VANCOCIN) IVPB 1000 mg/200 mL premix        1,000 mg 200 mL/hr over 60 Minutes Intravenous On call 11/25/21 1628 11/26/21 1740   11/04/21 1800  ceFEPIme (MAXIPIME) 2 g in sodium chloride 0.9 % 100 mL IVPB        2 g 200 mL/hr over 30 Minutes Intravenous Every M-W-F (1800) 11/03/21 0952 11/08/21 2359   11/04/21 1200  ceFEPIme (MAXIPIME) 2 g in sodium  chloride 0.9 % 100 mL IVPB  Status:  Discontinued        2 g 200 mL/hr over 30 Minutes Intravenous Every M-W-F (Hemodialysis) 11/02/21 0941 11/03/21 0952   11/02/21 1800  ceFEPIme (MAXIPIME) 2 g in sodium chloride 0.9 % 100 mL IVPB        2 g 200 mL/hr over 30 Minutes Intravenous  Once 11/02/21 0941 11/02/21 1812   11/02/21 1200  ceFEPIme (MAXIPIME) 2 g in sodium chloride 0.9 % 100 mL IVPB  Status:  Discontinued        2 g 200 mL/hr over 30 Minutes Intravenous Every T-Th-Sa (Hemodialysis) 11/01/21 1934 11/02/21 0941   11/01/21 2200  metroNIDAZOLE (FLAGYL) tablet 500 mg        500 mg Oral Every 12 hours 11/01/21 1827 11/08/21 2225   11/01/21 1945  ceFEPIme (MAXIPIME) 1 g in sodium chloride 0.9 % 100 mL IVPB        1 g 200 mL/hr over 30 Minutes Intravenous  Once 11/01/21 1933 11/01/21 2032   11/01/21 1730  ceFEPIme (MAXIPIME) 1 g in sodium chloride 0.9 % 100 mL IVPB  Status:  Discontinued        1 g 200 mL/hr over 30 Minutes Intravenous Every 24 hours 11/01/21 1720 11/01/21 1933       Subjective: Patient was seen and examined at bedside.  No overnight events noted. Patient still complains of burning sensation over abdominal wall and at the site of calciphylaxis. Patient reports having constipation.  Objective: Vitals:   03/12/22 0900 03/12/22 1658 03/12/22 2118 03/13/22 0528  BP: (!) 141/74 (!) 137/59 135/75 (!) 123/53  Pulse: 70 74 71 76  Resp: 16 16 18 18   Temp: 98.7 F (37.1 C) 98.7 F (37.1 C) 98.1 F (36.7 C) 98.7 F (37.1 C)  TempSrc: Oral Oral Oral Oral  SpO2: 100% 100% 97% 100%  Weight:      Height:        Intake/Output Summary (Last 24 hours) at 03/13/2022 1214 Last data filed at 03/13/2022 1000 Gross per 24 hour  Intake 940 ml  Output 0 ml  Net 940 ml   Filed Weights   03/10/22 0500  Weight: 124.3 kg    Examination:  General exam: Appears comfortable, not in any acute distress.  Deconditioned Respiratory system: CTA bilaterally, respiratory effort  normal, RR 15. Cardiovascular system: S1-S2 heard, regular rate and rhythm, no murmur. Gastrointestinal system: Abdomen is soft, nontender, obese, nodular ulcerated areas covered with dressing. Central nervous system: Alert and oriented x 3. No focal neurological deficits. Extremities: Bilateral above-knee amputation. Skin: No rashes, lesions or ulcers Psychiatry: Judgement and insight appear normal. Mood & affect appropriate.     Data Reviewed: I have personally reviewed following labs and imaging studies  CBC: Recent Labs  Lab 03/07/22 1837 03/08/22 1830 03/11/22 1300  WBC 11.5* 12.2* 10.4  HGB 7.7* 7.5* 7.1*  HCT 25.5* 24.6* 24.6*  MCV 96.2 95.7 98.4  PLT 266 301 160   Basic Metabolic Panel: Recent Labs  Lab 03/07/22 1837 03/08/22 1830 03/11/22 1300  NA 137 136 138  K 4.9 4.9 4.4  CL 100 97* 99  CO2 24 24 27   GLUCOSE 84 78 93  BUN 44* 51* 36*  CREATININE 10.74* 12.30* 11.29*  CALCIUM 9.3 9.2 9.0  PHOS 5.9* 5.6*  --    GFR: Estimated Creatinine Clearance: 7.8 mL/min (A) (by C-G formula based on SCr of 11.29 mg/dL (H)). Liver Function Tests: Recent Labs  Lab 03/07/22 1837 03/08/22 1830  ALBUMIN 2.6* 2.7*   No results for input(s): "LIPASE", "AMYLASE" in the last 168 hours. No results for input(s): "AMMONIA" in the last 168 hours. Coagulation Profile: No results for input(s): "INR", "PROTIME" in the last 168 hours. Cardiac Enzymes: No results for input(s): "CKTOTAL", "CKMB", "CKMBINDEX", "TROPONINI" in the last 168 hours. BNP (last 3 results) No results for input(s): "PROBNP" in the last 8760 hours. HbA1C: No results for input(s): "HGBA1C" in the last 72 hours. CBG: No results for input(s): "GLUCAP" in the last 168 hours. Lipid Profile: No results for input(s): "CHOL", "HDL", "LDLCALC", "TRIG", "CHOLHDL", "LDLDIRECT" in the last 72 hours. Thyroid Function Tests: No results for input(s): "TSH", "T4TOTAL", "FREET4", "T3FREE", "THYROIDAB" in the last 72  hours. Anemia Panel: No results for input(s): "VITAMINB12", "FOLATE", "FERRITIN", "TIBC", "IRON", "RETICCTPCT" in the last 72 hours. Sepsis Labs: No results for input(s): "PROCALCITON", "LATICACIDVEN" in the last 168 hours.  No results found for this or any previous visit (from the past 240 hour(s)).   Radiology Studies: No results found.  Scheduled Meds:  sodium chloride   Intravenous Once   aspirin EC  81 mg Oral Daily   atorvastatin  10 mg Oral Daily   Chlorhexidine Gluconate Cloth  6 each Topical Q0600   darbepoetin (ARANESP) injection - DIALYSIS  200 mcg Subcutaneous Q Tue-1800   docusate sodium  100 mg Oral BID   famotidine  20 mg Oral Daily   feeding supplement  1 Container Oral TID BM   gabapentin  100 mg Oral Q1200   gabapentin  200 mg Oral QHS   Gerhardt's butt cream   Topical BID   hydroquinone   Topical BID   lactulose  10 g Oral TID   levothyroxine  100 mcg Oral Q0600   loratadine  10 mg Oral Daily   melatonin  3 mg Oral QHS   midodrine  10 mg Oral Once per day on Tue Thu Sat   pantoprazole  40 mg Oral Daily   sevelamer carbonate  4.8 g Oral TID WC   sucroferric oxyhydroxide  500 mg Oral TID WC   Continuous Infusions:  sodium thiosulfate 12.5 g in sodium chloride 0.9 % 150 mL Infusion for Calciphylaxis Stopped (03/11/22 1717)     LOS: 132 days    Time spent: 35 mins    Cloey Sferrazza, MD [facility]   If 7PM-7AM, please contact night-coverage HistConstipation improved with lactulose. history of irritable bowel syndrome history of irritable bowel syndromeory of irritable bowel syndrome

## 2022-03-13 NOTE — Progress Notes (Signed)
[facility] Progress Note   Subjective:  Seen in room - she refused to have blood drawn this AM, would like it done at start of HD later today which is fine although was hoping to have it earlier so she could give me a decision on whether she would accept a blood transfusion or not - still wants to think about it. Requesting HD to be as late as possible now - will see what we can do, no promises made. Ongoing abdominal pain.  Objective Vitals:   03/12/22 0900 03/12/22 1658 03/12/22 2118 03/13/22 0528  BP: (!) 141/74 (!) 137/59 135/75 (!) 123/53  Pulse: 70 74 71 76  Resp: _0 Temp: 98.7 F (37.1 C) 98.7 F (37.1 C) 98.1 F (36.7 C) 98.7 F (37.1 C)  TempSrc: Oral Oral Oral Oral  SpO2: 100% 100% 97% 100%  Weight:      Height:       Physical Exam General: Chronically ill appearing woman, NAD.  Heart: RRR; no murmur Lungs: CTAB; no rales Abdomen: obese; tender/firm purplish nodules to B abdomen - mostly bandaged. Extremities: B AKA; no stump edema Dialysis Access:  [facility] (s/p recent exchange)  Additional Objective Labs: Basic Metabolic Panel: Recent Labs  Lab 03/07/22 1837 03/08/22 1830 03/11/22 1300  NA 137 136 138  K 4.9 4.9 4.4  CL 100 97* 99  CO2 _1 GLUCOSE 84 78 93  BUN 44* 51* 36*  CREATININE 10.74* 12.30* 11.29*  CALCIUM 9.3 9.2 9.0  PHOS 5.9* 5.6*  --    Liver Function Tests: Recent Labs  Lab 03/07/22 1837 03/08/22 1830  ALBUMIN 2.6* 2.7*   CBC: Recent Labs  Lab 03/07/22 1837 03/08/22 1830 03/11/22 1300  WBC 11.5* 12.2* 10.4  HGB 7.7* 7.5* 7.1*  HCT 25.5* 24.6* 24.6*  MCV 96.2 95.7 98.4  PLT 266 301 286   Medications:  sodium thiosulfate 12.5 g in sodium chloride 0.9 % 150 mL Infusion for Calciphylaxis Stopped (03/11/22 1717)    sodium chloride   Intravenous Once   aspirin EC  81 mg Oral Daily   atorvastatin  10 mg Oral Daily   Chlorhexidine Gluconate Cloth  6 each Topical Q0600   darbepoetin (ARANESP)  injection - DIALYSIS  200 mcg Subcutaneous Q Tue-1800   docusate sodium  100 mg Oral BID   famotidine  20 mg Oral Daily   feeding supplement  1 Container Oral TID BM   gabapentin  100 mg Oral Q1200   gabapentin  200 mg Oral QHS   Gerhardt's butt cream   Topical BID   hydroquinone   Topical BID   lactulose  10 g Oral TID   levothyroxine  100 mcg Oral Q0600   loratadine  10 mg Oral Daily   melatonin  3 mg Oral QHS   midodrine  10 mg Oral Once per day on Tue Thu Sat   pantoprazole  40 mg Oral Daily   sevelamer carbonate  4.8 g Oral TID WC   sucroferric oxyhydroxide  500 mg Oral TID WC    Dialysis Orders: Was only dialyzed a few times as outpatient - no current OP spot at this time.   Assessment/Plan: Inability to obtain HD at OP center D/T body habitus and wound: Difficult situation with no clear answers yet. PT working with pt -  met with seating specialist to get evaluated for custom chair/cushion that will allow her sit for longer periods.  Appreciate efforts from  PT team.  ESRD: Usual TTS schedule - was refusing HD at times - has been discussed extensively. New TDC placed 02/04/2022, TDC stopped working again 11/30. IR consulted - S/p venogram, angioplasty of SVC and innominate veins, and TDC exchange. Next HD today. 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 Started empirically on sodium thiosulfate, but caused vomiting so stopped - now getting 1/2 dose - 12.54m q HD. Not on any calcium based products or vitamin D. Needs better control of Phos - have added Velphoro as well. HypoTN/volume: On midodrine 120mqHD, with 1010mrn mid HD w/parameters to keep SBP >100 d/t becoming symptomatic when it drops lower. Significant weight loss this admit. Severe ischial sacral decubitus wound: CT showing chronic osteomyelitis. Limiting her, noted to be slow healing. Goal to tolerate sitting upright - which she is doing with PT.   ID started doxycycline which caused diarrhea - now off.  COPD/Chronic O2 use: Prior O2 at home (5L/min) -> tapered to 2-3L/min here Anemia of ESRD: Hgb down to 7.1 - on max dose Aranesp 200m17meekly. Recent Fe load completed. She denies GI losses, stool cards ordered. Ordered 1U PRBCs with HD today - she is still considering if she will accept - hasn't decided yet. Secondary hyperparathyroidism: Phos high again - ordered for Renvela 4.8g AC TID, refusing/missing at times. Added Velphoro 500mg99m to regimen. CorrCa elevated. Not on VDRA, last PTH 86 (low) on 03/11/22. Avoid all calcium based medications for Vitamin D d/t concern for calciphylaxis.  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 Nutrition - diet continues to be changed from renal.  Currently on regular diet.  Should stay on renal diet - she is considering changing back.  Disposition: Difficult situation. Prior to admit, patient required 6 staff members and 2 hoyer pads for transfer into HD chair. Could not stay on HD for full tx. 2/2 pain of multiple decubitus ulcers and PTAR unable to lift pt into ambulance for transport.SW working on LTC at SNF. Con-wayl need to tolerate 5-6hrs in a chair to tolerate OP dialysis including transport, treatment and cannulation time.  The main issue is her Rt ischial wound. PT has arranged seating specialist with plan for custom power chair/cushion that would keep pressure off wounds and allow her to sit for longer periods   KatieVeneta PentonC Hershal Coria/2023, 12:06 PM  [employer]

## 2022-03-13 NOTE — Progress Notes (Signed)
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    PT Comments    Excellent session today. Patient tolerated OOB transfer to power chair. Spent majority of session learning to reposition for pressure relief, navigate power wheelchair, and make adjustments to chair for both safety and comfort. Pt excited to do so well with device and felt much better in device today. Verbalizes understanding for need to increase time in chair but with scheduled pressure relief (able to identify but uses reminder on wall.) Encouraged to continue working with PT on transfer training to build independence with transfers as well. Pt needs to get OOB with nursing using maxisky more frequently for increased time in power w/c and she is beginning to understand this more clearly I believe. Patient will continue to benefit from skilled physical therapy services to further improve independence with functional mobility.  Goals updated as she is making progress but level of independence with goals remains appropriate.    Recommendations for follow up therapy are one component of a multi-disciplinary discharge planning process, led by the attending physician.  Recommendations may be updated based on patient status, additional functional criteria and insurance authorization.  Follow Up Recommendations  Skilled nursing-short term rehab (<3 hours/day) Can patient physically be transported by private vehicle: No   Assistance Recommended at Discharge Intermittent Supervision/Assistance  Patient can return  home with the following A lot of help with walking and/or transfers;A lot of help with bathing/dressing/bathroom;Assist for transportation;Help with stairs or ramp for entrance   Equipment Recommendations  Wheelchair (measurements PT);Wheelchair cushion (measurements PT);Other (comment);Hospital bed (more accomodating mechanical lift, with sling that handles pt's with bil AKAs; Roho cushion (20x24), power wheelchair with power tilt and recline)    Recommendations for Other Services Rehab consult     Precautions / Restrictions Precautions Precautions: Fall Precaution Comments: bil AKA, 2L O2 at baseline; when in poer chair, MUST perform pressure relief every 30 minutes by tilting back for 10 minutes Restrictions Weight Bearing Restrictions: Yes RUE Weight Bearing: Weight bearing as tolerated LUE Weight Bearing: Weight bearing as tolerated RLE Weight Bearing: Non weight bearing LLE Weight Bearing: Non weight bearing Other Position/Activity Restrictions: when in power chair, MUST perform pressure relief every 30 minutes by tilting back for 10 minutes     Mobility  Bed Mobility Overal bed mobility: Modified Independent Bed Mobility: Rolling Rolling: Modified independent (Device/Increase time)   Supine to sit: Modified independent (Device/Increase time) Sit to supine: Modified independent (Device/Increase time)   General bed mobility comments: pulls on rails to sit upright in bed    Transfers Overall transfer level: Needs assistance Equipment used: Ambulation equipment used Transfers: Bed to chair/wheelchair/BSC             General transfer comment: Maxisky lift to chair and bed. Transfer via Actuary Details: unable   Theme park manager mobility: Yes Wheelchair propulsion:  (Power) Wheelchair parts: Supervision/cueing  Distance: 200 Wheelchair  Assistance Details (indicate cue type and reason): Spent significant portion of time working on repositioning and learning how to appropriately use power w/c. Patient able to teach back and apparently recalls majority of use. Reviewed additional safety precautions when using including turning off when not in use so she does not accidently move joy-stick. Discussed and practiced manipulating power-chair in congested areas, while requiring cues for clearance when backing up and scanning enviornmnet for hazards. Pt able to cautiously move power-chair through room, lobby room, hallway and nursing station pass-through today. Able to appropriately tilt back, verbalize unloading schedule (although reads reminders on wall) and fasten seat-belt with some assistance for set-up. Tried other w/c cushion Ulice Dash) in room per pt request. Tolerated >15 min w/c, returned to bed as she reports dialysis was coming soon and did not want to stay up in chair for dialysis yet.  Modified Rankin (Stroke Patients Only)       Balance Overall balance assessment: Mild deficits observed, not formally tested Sitting-balance support: No upper extremity supported Sitting balance-[name] Scale: Good   Postural control:  (to take out sling)                                  Cognition Arousal/Alertness: Awake/alert Behavior During Therapy: WFL for tasks assessed/performed Overall Cognitive Status: Within Functional Limits for tasks assessed (for simple tasks)                                 General Comments: Concern for pt remembering to perform pressure relief and following through with pressure relief        Exercises      General Comments General comments (skin integrity, edema, etc.): See w/c mobility above.      Pertinent Vitals/Pain Pain Assessment Pain Assessment: Faces Faces Pain Scale: Hurts a little bit Pain Location: abdomen wounds Pain Descriptors / Indicators: Grimacing,  Discomfort Pain Intervention(s): Monitored during session, Repositioned    Home Living                          Prior Function            PT Goals (current goals can now be found in the care plan section) Acute Rehab PT Goals Patient Stated Goal: Get up and use power chair PT Goal Formulation: With patient Time For Goal Achievement: 03/27/22 Potential to Achieve Goals: Good Progress towards PT goals: Progressing toward goals    Frequency    Min 2X/week      PT Plan Current plan remains appropriate    Co-evaluation              AM-PAC PT "6 Clicks" Mobility   Outcome Measure  Help needed turning from your back to your side while in a flat bed without using bedrails?: None Help needed moving from lying on your back to sitting on the side of a flat bed without using bedrails?: None Help needed moving to and from a bed to a chair (including a wheelchair)?: A Lot Help needed standing up from a chair using your arms (e.g., wheelchair or bedside chair)?: Total Help needed to walk in hospital room?: Total Help needed climbing 3-5 steps with a railing? : Total 6 Click Score: 13    End of Session Equipment Utilized During Treatment: Oxygen Activity  Tolerance: Patient tolerated treatment well Patient left: in bed;with call bell/phone within reach Nurse Communication: Mobility status;Need for lift equipment PT Visit Diagnosis: Other abnormalities of gait and mobility (R26.89);Muscle weakness (generalized) (M62.81) Pain - Right/Left: Right Pain - part of body: Hip;Leg (ischial tuberosity; R abdominal nodular pain)     Time: 9432-0037 PT Time Calculation (min) (ACUTE ONLY): 71 min  Charges:  $Therapeutic Activity: 23-37 mins $Self Care/Home Management: 8-22 $Wheel Chair Management: 23-37 mins                     Candie Mile, PT, DPT Physical Therapist Acute Rehabilitation Services [place] 03/13/2022, 4:21 PM

## 2022-03-14 DIAGNOSIS — I5023 Acute on chronic systolic (congestive) heart failure: Secondary | ICD-10-CM | POA: Diagnosis not present

## 2022-03-14 LAB — BASIC METABOLIC PANEL
Anion gap: 12 (ref 5–15)
BUN: 9 mg/dL (ref 6–20)
CO2: 27 mmol/L (ref 22–32)
Calcium: 9 mg/dL (ref 8.9–10.3)
Chloride: 99 mmol/L (ref 98–111)
Creatinine, Ser: 4.08 mg/dL — ABNORMAL HIGH (ref 0.44–1.00)
GFR, Estimated: 13 mL/min — ABNORMAL LOW (ref >60)
Glucose, Bld: 110 mg/dL — ABNORMAL HIGH (ref 70–99)
Potassium: 4 mmol/L (ref 3.5–5.1)
Sodium: 138 mmol/L (ref 135–145)

## 2022-03-14 LAB — HEMOGLOBIN AND HEMATOCRIT, BLOOD
HCT: 26.9 % — ABNORMAL LOW (ref 36.0–46.0)
Hemoglobin: 8 g/dL — ABNORMAL LOW (ref 12.0–15.0)

## 2022-03-14 MED ORDER — LIDOCAINE-PRILOCAINE 2.5-2.5 % EX CREA
TOPICAL_CREAM | CUTANEOUS | Status: DC | PRN
  Filled 2022-03-14: qty 5
  Filled 2022-03-14 (×5): qty 30
  Filled 2022-03-14: qty 5
  Filled 2022-03-14: qty 30
  Filled 2022-03-14 (×4): qty 5
  Filled 2022-03-14 (×2): qty 30
  Filled 2022-03-14: qty 5
  Filled 2022-03-14: qty 30

## 2022-03-14 MED ORDER — OXYCODONE HCL 5 MG PO TABS
10.0000 mg | ORAL_TABLET | ORAL | Status: DC | PRN
  Administered 2022-03-14 – 2022-03-17 (×10): 10 mg via ORAL
  Filled 2022-03-14 (×10): qty 2

## 2022-03-14 MED ORDER — HYDROMORPHONE HCL 2 MG PO TABS
1.0000 mg | ORAL_TABLET | ORAL | Status: DC | PRN
  Administered 2022-03-14 – 2022-03-18 (×12): 1 mg via ORAL
  Filled 2022-03-14 (×13): qty 1

## 2022-03-14 NOTE — Progress Notes (Signed)
[facility] Progress Note   Subjective:   Seen in room - pain with HD overnight. Hgb was up to 8 - never got (nor needed transfusion). She is convinced she needs less HD - will get URR next HD. No CP/dyspnea.  Objective Vitals:   03/14/22 0202 03/14/22 0232 03/14/22 0302 03/14/22 0337  BP: (!) 124/57 (!) 137/51  (!) 145/62  Pulse: 69 63 72 60  Resp: (!) 29 17 (!) 24 20  Temp:  98.2 F (36.8 C)  98 F (36.7 C)  TempSrc:  Oral  Oral  SpO2: 98% 99% 100% 100%  Weight:      Height:       Physical Exam General: Chronically ill appearing woman, NAD.  Heart: RRR; no murmur Lungs: CTAB; no rales Abdomen: obese; tender/firm purplish nodules to B abdomen - mostly bandaged. Extremities: B AKA; no stump edema Dialysis Access:  [facility] (s/p recent exchange)  Additional Objective Labs: Basic Metabolic Panel: Recent Labs  Lab 03/07/22 1837 03/08/22 1830 03/11/22 1300 03/14/22 1103  NA 137 136 138 138  K 4.9 4.9 4.4 4.0  CL 100 97* 99 99  CO2 _0 GLUCOSE 84 78 93 110*  BUN 44* 51* 36* 9  CREATININE 10.74* 12.30* 11.29* 4.08*  CALCIUM 9.3 9.2 9.0 9.0  PHOS 5.9* 5.6*  --   --    Liver Function Tests: Recent Labs  Lab 03/07/22 1837 03/08/22 1830  ALBUMIN 2.6* 2.7*   CBC: Recent Labs  Lab 03/07/22 1837 03/08/22 1830 03/11/22 1300 03/13/22 2300 03/14/22 1103  WBC 11.5* 12.2* 10.4  --   --   HGB 7.7* 7.5* 7.1* 7.2* 8.0*  HCT 25.5* 24.6* 24.6* 24.7* 26.9*  MCV 96.2 95.7 98.4  --   --   PLT 266 301 286  --   --    Medications:  sodium thiosulfate 12.5 g in sodium chloride 0.9 % 150 mL Infusion for Calciphylaxis Stopped (03/14/22 0335)    sodium chloride   Intravenous Once   aspirin EC  81 mg Oral Daily   atorvastatin  10 mg Oral Daily   Chlorhexidine Gluconate Cloth  6 each Topical Q0600   darbepoetin (ARANESP) injection - DIALYSIS  200 mcg Subcutaneous Q Tue-1800   docusate sodium  100 mg Oral BID   famotidine  20 mg Oral Daily   feeding  supplement  1 Container Oral TID BM   gabapentin  100 mg Oral Q1200   gabapentin  200 mg Oral QHS   Gerhardt's butt cream   Topical BID   hydroquinone   Topical BID   lactulose  10 g Oral TID   levothyroxine  100 mcg Oral Q0600   loratadine  10 mg Oral Daily   melatonin  3 mg Oral QHS   midodrine  10 mg Oral Once per day on Tue Thu Sat   pantoprazole  40 mg Oral Daily   sevelamer carbonate  4.8 g Oral TID WC   sucroferric oxyhydroxide  500 mg Oral TID WC    Dialysis Orders: Was only dialyzed a few times as outpatient - no current OP spot at this time.   Assessment/Plan: Inability to obtain HD at OP center D/T body habitus and wound: Difficult situation with no clear answers yet. PT working with pt -  met with seating specialist to get evaluated for custom chair/cushion that will allow her sit for longer periods.  Appreciate efforts from PT team.  ESRD: Usual TTS schedule -  was refusing HD at times - has been discussed extensively. New TDC placed 02/04/2022, TDC stopped working again 11/30. IR consulted - S/p venogram, angioplasty of SVC and innominate veins, and TDC exchange. Next HD tomorrow. 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 Started empirically on sodium thiosulfate, but caused vomiting so stopped - now getting 1/2 dose - 12.50m q HD. Not on any calcium based products or vitamin D. Needs better control of Phos - have added Velphoro as well. HypoTN/volume: On midodrine 169mqHD, with 1082mrn mid HD w/parameters to keep SBP >100 d/t becoming symptomatic when it drops lower. Significant weight loss this admit. Severe ischial sacral decubitus wound: CT showing chronic osteomyelitis. Limiting her, noted to be slow healing. Goal to tolerate sitting upright - which she is doing with PT.  ID started doxycycline which caused diarrhea - now off.  COPD/Chronic O2 use: Prior O2 at home (5L/min) -> tapered to 2-3L/min  here Anemia of ESRD: Hgb down to 7.1 on 12/7 - up to 8 today - on max dose Aranesp 200m59meekly. Recent Fe load completed. Secondary hyperparathyroidism: Phos high again - ordered for Renvela 4.8g AC TID, refusing/missing at times. Added Velphoro 500mg57m to regimen. CorrCa elevated. Not on VDRA, last PTH 86 (low) on 03/11/22. Avoid all calcium based medications for Vitamin D d/t concern for calciphylaxis.  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 Nutrition - diet continues to be changed from renal.  Currently on regular diet.  Should stay on renal diet - she is considering changing back.  Disposition: Difficult situation. Prior to admit, patient required 6 staff members and 2 hoyer pads for transfer into HD chair. Could not stay on HD for full tx. 2/2 pain of multiple decubitus ulcers and PTAR unable to lift pt into ambulance for transport.SW working on LTC at SNF. Con-wayl need to tolerate 5-6hrs in a chair to tolerate OP dialysis including transport, treatment and cannulation time.  The main issue is her Rt ischial wound. PT has arranged seating specialist with plan for custom power chair/cushion that would keep pressure off wounds and allow her to sit for longer periods   KatieVeneta PentonC Hershal Coria/2023, 2:20 PM  [employer]

## 2022-03-14 NOTE — Progress Notes (Signed)
Received patient in bed to unit.  Alert and oriented.  Informed consent signed and in chart.   Treatment initiated: 2132 Treatment completed: 0232  Patient tolerated well.  Transported back to the room  Alert, without acute distress.  Hand-off given to patient's nurse.   Access used: CATHETER Access issues: NONE  Total UF removed: 1500 Medication(s) given: sodium triosulfate Post HD VS:  98.2 137/51 66 17 100%  2L Post HD weight: unable to obtain.   [facility]

## 2022-03-14 NOTE — Progress Notes (Signed)
PROGRESS NOTE    Courtney Gray  HER:[id number] DOB: Jun 07, 1970 DOA: 11/01/2021 PCP: Monico Blitz, MD   Brief Narrative:  This 51 year old female, chronically ill with Morbid obesity, bedbound status, ESRD, PVD status post right AKA, chronic hypoxic respiratory failure on home O2, sacral decubitus ulcer, bacteremia with hepatic and splenic abscess status post splenic and liver drain, third digit of left hand amputation and left AKA, now bilateral AKA, presented to the hospital with progressive dyspnea on exertion in the setting of missed HD.  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    Currently, patient is awaiting placement at dialysis center where she can get to treatment but does not have accepting center so far.  Assessment & Plan:   Principal Problem:   Acute on chronic systolic CHF (congestive heart failure) (HCC) Active Problems:   End stage renal disease (HCC)   Hypertension   Decubitus ulcer of sacral region, stage 3 (HCC)   Type 2 diabetes mellitus with hyperlipidemia (HCC)   Chronic respiratory failure with hypoxia (HCC)   Bacteremia   PVD (peripheral vascular disease) (HCC)   Class 3 obesity (HCC)   Anemia due to chronic kidney disease   Acute on chronic diastolic CHF (congestive heart failure) (HCC)   Hypertensive emergency   Leukocytosis   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HCC)   Pressure injury of skin   PAD (peripheral artery disease) (HCC)   Irritable bowel syndrome (IBS)   Amputation of left middle finger   GERD (gastroesophageal reflux disease)   Nodule of skin of abdomen  End-stage renal disease with volume overload: Patient is currently undergoing hemodialysis but unable to sit in the chair for full treatment. Nephrology is following. She recently underwent tunnel catheter exchange by IR for catheter malfunction.   She need to tolerate hemodialysis for 5 to 6 hours in the  chair to continue outpatient dialysis. PT in contact with seating specialist with the plan for custom power chair/cushion that will keep pressure off wounds and allowed her to sit for longer periods.   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   Received sodium thiosulfate on 03/09/2022.  She still complains of burning pain around the nodular areas.     Deep sacral decubitus wound, early osteomyelitis of right ischium: She is found to have deep wound without overt infection.   CT abdomen pelvis 02/17/22 with some signs of osteomyelitis near right ischium, suspected to chronic osteomyelitis.   ID consulted and patient was started on doxycycline which she could not tolerate.  Doxycycline discontinued.   Watchful waiting has been pursued.   Anemia of chronic kidney disease : Hemoglobin 7.5 >7.1 >7.2.  Overall stable.  She received erythropoietin and iron. Transfuse If Hb drops below 7.0  Electrolyte imbalance:  Corrected with renal replacement therapy.  Latest potassium 4.4  Hypotension: Continue midodrine with hemodialysis.. Currently off antihypertensive medications.  History of IBS: Constipation improved with lactulose.  Type 2 diabetes with hyperlipidemia: Stable. Continue sliding scale.   Peripheral vascular disease /Bilateral BKA Continue supportive care.  COPD: Stable on home inhalers.   Continue supplemental oxygen at baseline.  Morbid obesity: Patient would benefit from weight loss as an outpatient. Estimated body mass index is 45.6 kg/m as calculated from the following:   Height as of this encounter: 5\' 5"  (1.651 m).   Weight as  of this encounter: 124.3 kg.    Chronic Diastolic CHF: On hemodialysis for volume management.    DVT prophylaxis: Unable to put SCDs.  Patient has multiple swellings in her abdomen and is not  willing to continue heparin shots.  Code Status: Full code Family Communication: No family at bed side. Disposition Plan:   Status is: Inpatient Remains inpatient appropriate because: Disposition issues, difficulty with hemodialysis chair.   Consultants:  Nephrology ID  Procedures: None.  Antimicrobials:  Anti-infectives (From admission, onward)    Start     Dose/Rate Route Frequency Ordered Stop   03/08/22 0045  vancomycin (VANCOCIN) IVPB 1000 mg/200 mL premix  Status:  Discontinued       Note to Pharmacy: Give in IR for surgical prophylaxis   1,000 mg 200 mL/hr over 60 Minutes Intravenous  Once 03/07/22 2345 03/07/22 2349   03/07/22 1001  vancomycin (VANCOCIN) IVPB 1000 mg/200 mL premix        over 60 Minutes Intravenous Continuous PRN 03/07/22 1027 03/07/22 1108   03/07/22 0953  vancomycin (VANCOCIN) 1-5 GM/200ML-% IVPB       Note to Pharmacy: Kandy Garrison J: cabinet override      03/07/22 0953 03/07/22 2159   03/04/22 1700  doxycycline (VIBRA-TABS) tablet 100 mg  Status:  Discontinued        100 mg Oral 2 times daily with meals 03/04/22 1519 03/07/22 1348   02/04/22 1637  vancomycin (VANCOCIN) 1-5 GM/200ML-% IVPB       Note to Pharmacy: Roe Coombs H: cabinet override      02/04/22 1637 02/04/22 1758   01/06/22 0600  vancomycin (VANCOCIN) IVPB 1000 mg/200 mL premix        1,000 mg 200 mL/hr over 60 Minutes Intravenous To Surgery 01/05/22 0709 01/07/22 0600   11/28/21 1215  vancomycin (VANCOCIN) IVPB 1000 mg/200 mL premix        1,000 mg 200 mL/hr over 60 Minutes Intravenous  Once 11/28/21 1117 11/28/21 1512   11/26/21 1000  vancomycin (VANCOCIN) IVPB 1000 mg/200 mL premix        1,000 mg 200 mL/hr over 60 Minutes Intravenous On call 11/25/21 1628 11/26/21 1740   11/04/21 1800  ceFEPIme (MAXIPIME) 2 g in sodium chloride 0.9 % 100 mL IVPB        2 g 200 mL/hr over 30 Minutes Intravenous Every M-W-F (1800) 11/03/21 0952 11/08/21 2359   11/04/21 1200  ceFEPIme  (MAXIPIME) 2 g in sodium chloride 0.9 % 100 mL IVPB  Status:  Discontinued        2 g 200 mL/hr over 30 Minutes Intravenous Every M-W-F (Hemodialysis) 11/02/21 0941 11/03/21 0952   11/02/21 1800  ceFEPIme (MAXIPIME) 2 g in sodium chloride 0.9 % 100 mL IVPB        2 g 200 mL/hr over 30 Minutes Intravenous  Once 11/02/21 0941 11/02/21 1812   11/02/21 1200  ceFEPIme (MAXIPIME) 2 g in sodium chloride 0.9 % 100 mL IVPB  Status:  Discontinued        2 g 200 mL/hr over 30 Minutes Intravenous Every T-Th-Sa (Hemodialysis) 11/01/21 1934 11/02/21 0941   11/01/21 2200  metroNIDAZOLE (FLAGYL) tablet 500 mg        500 mg Oral Every 12 hours 11/01/21 1827 11/08/21 2225   11/01/21 1945  ceFEPIme (MAXIPIME) 1 g in sodium chloride 0.9 % 100 mL IVPB        1 g 200 mL/hr over 30 Minutes Intravenous  Once 11/01/21  1933 11/01/21 2032   11/01/21 1730  ceFEPIme (MAXIPIME) 1 g in sodium chloride 0.9 % 100 mL IVPB  Status:  Discontinued        1 g 200 mL/hr over 30 Minutes Intravenous Every 24 hours 11/01/21 1720 11/01/21 1933       Subjective: Patient was seen and examined at bedside.  Overnight events noted. Patient still complains of burning sensation over abdominal wall and at the site of calciphylaxis. Patient reports having constipation.She asks to up titrate pain medications.  Objective: Vitals:   03/14/22 0202 03/14/22 0232 03/14/22 0302 03/14/22 0337  BP: (!) 124/57 (!) 137/51  (!) 145/62  Pulse: 69 63 72 60  Resp: (!) 29 17 (!) 24 20  Temp:  98.2 F (36.8 C)  98 F (36.7 C)  TempSrc:  Oral  Oral  SpO2: 98% 99% 100% 100%  Weight:      Height:        Intake/Output Summary (Last 24 hours) at 03/14/2022 1130 Last data filed at 03/14/2022 0302 Gross per 24 hour  Intake 240 ml  Output 1500 ml  Net -1260 ml   Filed Weights   03/10/22 0500  Weight: 124.3 kg    Examination:  General exam: Appears comfortable, not in any acute distress.  Deconditioned. Respiratory system: CTA bilaterally,  respiratory effort normal, RR 15. Cardiovascular system: S1-S2 heard, regular rate and rhythm, no murmur. Gastrointestinal system: Abdomen is soft,  mildly tender, obese, nodular ulcerated areas covered with dressing. Central nervous system: Alert and oriented x 3. No focal neurological deficits. Extremities: Bilateral above-knee amputation. Skin: No rashes, lesions or ulcers Psychiatry: Judgement and insight appear normal. Mood & affect appropriate.     Data Reviewed: I have personally reviewed following labs and imaging studies  CBC: Recent Labs  Lab 03/07/22 1837 03/08/22 1830 03/11/22 1300 03/13/22 2300  WBC 11.5* 12.2* 10.4  --   HGB 7.7* 7.5* 7.1* 7.2*  HCT 25.5* 24.6* 24.6* 24.7*  MCV 96.2 95.7 98.4  --   PLT 266 301 286  --    Basic Metabolic Panel: Recent Labs  Lab 03/07/22 1837 03/08/22 1830 03/11/22 1300  NA 137 136 138  K 4.9 4.9 4.4  CL 100 97* 99  CO2 24 24 27   GLUCOSE 84 78 93  BUN 44* 51* 36*  CREATININE 10.74* 12.30* 11.29*  CALCIUM 9.3 9.2 9.0  PHOS 5.9* 5.6*  --    GFR: Estimated Creatinine Clearance: 7.8 mL/min (A) (by C-G formula based on SCr of 11.29 mg/dL (H)). Liver Function Tests: Recent Labs  Lab 03/07/22 1837 03/08/22 1830  ALBUMIN 2.6* 2.7*   No results for input(s): "LIPASE", "AMYLASE" in the last 168 hours. No results for input(s): "AMMONIA" in the last 168 hours. Coagulation Profile: No results for input(s): "INR", "PROTIME" in the last 168 hours. Cardiac Enzymes: No results for input(s): "CKTOTAL", "CKMB", "CKMBINDEX", "TROPONINI" in the last 168 hours. BNP (last 3 results) No results for input(s): "PROBNP" in the last 8760 hours. HbA1C: No results for input(s): "HGBA1C" in the last 72 hours. CBG: No results for input(s): "GLUCAP" in the last 168 hours. Lipid Profile: No results for input(s): "CHOL", "HDL", "LDLCALC", "TRIG", "CHOLHDL", "LDLDIRECT" in the last 72 hours. Thyroid Function Tests: No results for input(s):  "TSH", "T4TOTAL", "FREET4", "T3FREE", "THYROIDAB" in the last 72 hours. Anemia Panel: No results for input(s): "VITAMINB12", "FOLATE", "FERRITIN", "TIBC", "IRON", "RETICCTPCT" in the last 72 hours. Sepsis Labs: No results for input(s): "PROCALCITON", "LATICACIDVEN" in the last  168 hours.  No results found for this or any previous visit (from the past 240 hour(s)).   Radiology Studies: No results found.  Scheduled Meds:  sodium chloride   Intravenous Once   aspirin EC  81 mg Oral Daily   atorvastatin  10 mg Oral Daily   Chlorhexidine Gluconate Cloth  6 each Topical Q0600   darbepoetin (ARANESP) injection - DIALYSIS  200 mcg Subcutaneous Q Tue-1800   docusate sodium  100 mg Oral BID   famotidine  20 mg Oral Daily   feeding supplement  1 Container Oral TID BM   gabapentin  100 mg Oral Q1200   gabapentin  200 mg Oral QHS   Gerhardt's butt cream   Topical BID   hydroquinone   Topical BID   lactulose  10 g Oral TID   levothyroxine  100 mcg Oral Q0600   loratadine  10 mg Oral Daily   melatonin  3 mg Oral QHS   midodrine  10 mg Oral Once per day on Tue Thu Sat   pantoprazole  40 mg Oral Daily   sevelamer carbonate  4.8 g Oral TID WC   sucroferric oxyhydroxide  500 mg Oral TID WC   Continuous Infusions:  sodium thiosulfate 12.5 g in sodium chloride 0.9 % 150 mL Infusion for Calciphylaxis Stopped (03/14/22 0335)     LOS: 133 days    Time spent: 53 mins    Darrian Grzelak, MD [facility]   If 7PM-7AM, please contact night-coverage HistConstipation improved with lactulose. history of irritable bowel syndrome history of irritable bowel syndromeory of irritable bowel syndrome

## 2022-03-15 DIAGNOSIS — I5023 Acute on chronic systolic (congestive) heart failure: Secondary | ICD-10-CM | POA: Diagnosis not present

## 2022-03-15 LAB — HEMOGLOBIN AND HEMATOCRIT, BLOOD
HCT: 25.9 % — ABNORMAL LOW (ref 36.0–46.0)
Hemoglobin: 7.7 g/dL — ABNORMAL LOW (ref 12.0–15.0)

## 2022-03-15 MED ORDER — HEPARIN SODIUM (PORCINE) 1000 UNIT/ML IJ SOLN
INTRAMUSCULAR | Status: AC
  Administered 2022-03-15: 4000 [IU]
  Filled 2022-03-15: qty 4

## 2022-03-15 NOTE — Progress Notes (Signed)
Renal Quick Note:  Will not be seen this weekend (12/9-12/10/23). She will be dialyzed later today (12/9).  Please call the on-call PA (as below) if any renal or dialysis needs.  [name], PA-C [employer] Pager [phone]

## 2022-03-15 NOTE — Progress Notes (Signed)
PROGRESS NOTE    Courtney Gray  ZOX:[id number] DOB: 01-Apr-1971 DOA: 11/01/2021 PCP: Monico Blitz, MD   Brief Narrative:  This 51 year old female, chronically ill with Morbid obesity, bedbound status, ESRD, PVD status post right AKA, chronic hypoxic respiratory failure on home O2, sacral decubitus ulcer, bacteremia with hepatic and splenic abscess status post splenic and liver drain, third digit of left hand amputation and left AKA, now bilateral AKA, presented to the hospital with progressive dyspnea on exertion in the setting of missed HD.  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    Currently, patient is awaiting placement at dialysis center where she can get to treatment but does not have accepting center so far.  Assessment & Plan:   Principal Problem:   Acute on chronic systolic CHF (congestive heart failure) (HCC) Active Problems:   End stage renal disease (HCC)   Hypertension   Decubitus ulcer of sacral region, stage 3 (HCC)   Type 2 diabetes mellitus with hyperlipidemia (HCC)   Chronic respiratory failure with hypoxia (HCC)   Bacteremia   PVD (peripheral vascular disease) (HCC)   Class 3 obesity (HCC)   Anemia due to chronic kidney disease   Acute on chronic diastolic CHF (congestive heart failure) (HCC)   Hypertensive emergency   Leukocytosis   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HCC)   Pressure injury of skin   PAD (peripheral artery disease) (HCC)   Irritable bowel syndrome (IBS)   Amputation of left middle finger   GERD (gastroesophageal reflux disease)   Nodule of skin of abdomen  End-stage renal disease with volume overload: Patient is currently undergoing hemodialysis but unable to sit in the chair for full treatment. Nephrology is following. She recently underwent tunnel catheter exchange by IR for catheter malfunction.   She need to tolerate hemodialysis for 5 to 6 hours in the  chair to continue outpatient dialysis. PT is in contact with seating specialist with the plan for custom power chair / cushion that will keep pressure off wounds and allowed her to sit for longer periods.   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   Received sodium thiosulfate on 03/09/2022.  She still complains of burning pain around the nodular areas.   Pain medications adjusted.   Deep sacral decubitus wound, early osteomyelitis of right ischium: She is found to have deep wound without overt infection.   CT abdomen pelvis 02/17/22 with some signs of osteomyelitis near right ischium, suspected to chronic osteomyelitis.   ID consulted and patient was started on doxycycline which she could not tolerate.  Doxycycline discontinued.   Watchful waiting has been pursued.   Anemia of chronic kidney disease : Hemoglobin 7.5 >8.0>7.7  Overall stable.  She received erythropoietin and iron. Transfuse If Hb drops below 7.0  Electrolyte imbalance:  Corrected with renal replacement therapy.  Latest potassium 4.0  Hypotension: Continue midodrine with hemodialysis. Currently off antihypertensive medications.  History of IBS: Constipation improved with lactulose.  Type 2 diabetes with hyperlipidemia: Stable. Continue sliding scale.   Peripheral vascular disease / Bilateral BKA: Continue supportive care.  COPD: Stable on home inhalers.   Continue supplemental oxygen at baseline.  Morbid obesity: Patient would benefit from weight loss as an outpatient. Estimated body mass index is 45.6 kg/m as calculated from the following:   Height as of this encounter: 5\' 5"  (  1.651 m).   Weight as of this encounter: 124.3 kg.    Chronic Diastolic CHF: On hemodialysis for volume management.    DVT prophylaxis: Unable to put SCDs.  Patient has multiple  swellings in her abdomen and is not willing to continue heparin shots.  Code Status: Full code Family Communication: No family at bed side. Disposition Plan:   Status is: Inpatient Remains inpatient appropriate because: Disposition issues, difficulty with hemodialysis chair.   Consultants:  Nephrology ID  Procedures: None.  Antimicrobials:  Anti-infectives (From admission, onward)    Start     Dose/Rate Route Frequency Ordered Stop   03/08/22 0045  vancomycin (VANCOCIN) IVPB 1000 mg/200 mL premix  Status:  Discontinued       Note to Pharmacy: Give in IR for surgical prophylaxis   1,000 mg 200 mL/hr over 60 Minutes Intravenous  Once 03/07/22 2345 03/07/22 2349   03/07/22 1001  vancomycin (VANCOCIN) IVPB 1000 mg/200 mL premix        over 60 Minutes Intravenous Continuous PRN 03/07/22 1027 03/07/22 1108   03/07/22 0953  vancomycin (VANCOCIN) 1-5 GM/200ML-% IVPB       Note to Pharmacy: Kandy Garrison J: cabinet override      03/07/22 0953 03/07/22 2159   03/04/22 1700  doxycycline (VIBRA-TABS) tablet 100 mg  Status:  Discontinued        100 mg Oral 2 times daily with meals 03/04/22 1519 03/07/22 1348   02/04/22 1637  vancomycin (VANCOCIN) 1-5 GM/200ML-% IVPB       Note to Pharmacy: Roe Coombs H: cabinet override      02/04/22 1637 02/04/22 1758   01/06/22 0600  vancomycin (VANCOCIN) IVPB 1000 mg/200 mL premix        1,000 mg 200 mL/hr over 60 Minutes Intravenous To Surgery 01/05/22 0709 01/07/22 0600   11/28/21 1215  vancomycin (VANCOCIN) IVPB 1000 mg/200 mL premix        1,000 mg 200 mL/hr over 60 Minutes Intravenous  Once 11/28/21 1117 11/28/21 1512   11/26/21 1000  vancomycin (VANCOCIN) IVPB 1000 mg/200 mL premix        1,000 mg 200 mL/hr over 60 Minutes Intravenous On call 11/25/21 1628 11/26/21 1740   11/04/21 1800  ceFEPIme (MAXIPIME) 2 g in sodium chloride 0.9 % 100 mL IVPB        2 g 200 mL/hr over 30 Minutes Intravenous Every M-W-F (1800) 11/03/21 0952 11/08/21  2359   11/04/21 1200  ceFEPIme (MAXIPIME) 2 g in sodium chloride 0.9 % 100 mL IVPB  Status:  Discontinued        2 g 200 mL/hr over 30 Minutes Intravenous Every M-W-F (Hemodialysis) 11/02/21 0941 11/03/21 0952   11/02/21 1800  ceFEPIme (MAXIPIME) 2 g in sodium chloride 0.9 % 100 mL IVPB        2 g 200 mL/hr over 30 Minutes Intravenous  Once 11/02/21 0941 11/02/21 1812   11/02/21 1200  ceFEPIme (MAXIPIME) 2 g in sodium chloride 0.9 % 100 mL IVPB  Status:  Discontinued        2 g 200 mL/hr over 30 Minutes Intravenous Every T-Th-Sa (Hemodialysis) 11/01/21 1934 11/02/21 0941   11/01/21 2200  metroNIDAZOLE (FLAGYL) tablet 500 mg        500 mg Oral Every 12 hours 11/01/21 1827 11/08/21 2225   11/01/21 1945  ceFEPIme (MAXIPIME) 1 g in sodium chloride 0.9 % 100 mL IVPB        1 g 200 mL/hr over  30 Minutes Intravenous  Once 11/01/21 1933 11/01/21 2032   11/01/21 1730  ceFEPIme (MAXIPIME) 1 g in sodium chloride 0.9 % 100 mL IVPB  Status:  Discontinued        1 g 200 mL/hr over 30 Minutes Intravenous Every 24 hours 11/01/21 1720 11/01/21 1933       Subjective: Patient was seen and examined at bedside. Overnight events noted. Patient still reports burning sensation over abdominal wall and at the site of calciphylaxis. Her pain medications were adjusted yesterday , states pain is reasonably controlled.  Objective: Vitals:   03/14/22 0302 03/14/22 0337 03/15/22 0558 03/15/22 0950  BP:  (!) 145/62 (!) 144/45 (!) 148/61  Pulse: 72 60 83 72  Resp: (!) 24 20 20 17   Temp:  98 F (36.7 C) 98.6 F (37 C) 98.3 F (36.8 C)  TempSrc:  Oral Oral Oral  SpO2: 100% 100% 100% 100%  Weight:      Height:        Intake/Output Summary (Last 24 hours) at 03/15/2022 1119 Last data filed at 03/15/2022 0600 Gross per 24 hour  Intake 820 ml  Output 0 ml  Net 820 ml   Filed Weights   03/10/22 0500  Weight: 124.3 kg    Examination:  General exam: Appears comfortable, not in any acute distress.   Deconditioned. Respiratory system: CTA bilaterally, respiratory effort normal, RR 15. Cardiovascular system: S1-S2 heard, regular rate and rhythm, no murmur. Gastrointestinal system: Abdomen is soft, mildly tender, obese, nodular ulcerated areas noted,  covered with dressing. Central nervous system: Alert and oriented x 3. No focal neurological deficits. Extremities: Bilateral above-knee amputation. Skin: No rashes, lesions or ulcers Psychiatry: Judgement and insight appear normal. Mood & affect appropriate.     Data Reviewed: I have personally reviewed following labs and imaging studies  CBC: Recent Labs  Lab 03/08/22 1830 03/11/22 1300 03/13/22 2300 03/14/22 1103 03/15/22 0918  WBC 12.2* 10.4  --   --   --   HGB 7.5* 7.1* 7.2* 8.0* 7.7*  HCT 24.6* 24.6* 24.7* 26.9* 25.9*  MCV 95.7 98.4  --   --   --   PLT 301 286  --   --   --    Basic Metabolic Panel: Recent Labs  Lab 03/08/22 1830 03/11/22 1300 03/14/22 1103  NA 136 138 138  K 4.9 4.4 4.0  CL 97* 99 99  CO2 24 27 27   GLUCOSE 78 93 110*  BUN 51* 36* 9  CREATININE 12.30* 11.29* 4.08*  CALCIUM 9.2 9.0 9.0  PHOS 5.6*  --   --    GFR: Estimated Creatinine Clearance: 21.6 mL/min (A) (by C-G formula based on SCr of 4.08 mg/dL (H)). Liver Function Tests: Recent Labs  Lab 03/08/22 1830  ALBUMIN 2.7*   No results for input(s): "LIPASE", "AMYLASE" in the last 168 hours. No results for input(s): "AMMONIA" in the last 168 hours. Coagulation Profile: No results for input(s): "INR", "PROTIME" in the last 168 hours. Cardiac Enzymes: No results for input(s): "CKTOTAL", "CKMB", "CKMBINDEX", "TROPONINI" in the last 168 hours. BNP (last 3 results) No results for input(s): "PROBNP" in the last 8760 hours. HbA1C: No results for input(s): "HGBA1C" in the last 72 hours. CBG: No results for input(s): "GLUCAP" in the last 168 hours. Lipid Profile: No results for input(s): "CHOL", "HDL", "LDLCALC", "TRIG", "CHOLHDL",  "LDLDIRECT" in the last 72 hours. Thyroid Function Tests: No results for input(s): "TSH", "T4TOTAL", "FREET4", "T3FREE", "THYROIDAB" in the last 72 hours. Anemia  Panel: No results for input(s): "VITAMINB12", "FOLATE", "FERRITIN", "TIBC", "IRON", "RETICCTPCT" in the last 72 hours. Sepsis Labs: No results for input(s): "PROCALCITON", "LATICACIDVEN" in the last 168 hours.  No results found for this or any previous visit (from the past 240 hour(s)).   Radiology Studies: No results found.  Scheduled Meds:  sodium chloride   Intravenous Once   aspirin EC  81 mg Oral Daily   atorvastatin  10 mg Oral Daily   Chlorhexidine Gluconate Cloth  6 each Topical Q0600   darbepoetin (ARANESP) injection - DIALYSIS  200 mcg Subcutaneous Q Tue-1800   docusate sodium  100 mg Oral BID   famotidine  20 mg Oral Daily   feeding supplement  1 Container Oral TID BM   gabapentin  100 mg Oral Q1200   gabapentin  200 mg Oral QHS   Gerhardt's butt cream   Topical BID   hydroquinone   Topical BID   lactulose  10 g Oral TID   levothyroxine  100 mcg Oral Q0600   loratadine  10 mg Oral Daily   melatonin  3 mg Oral QHS   midodrine  10 mg Oral Once per day on Tue Thu Sat   pantoprazole  40 mg Oral Daily   sevelamer carbonate  4.8 g Oral TID WC   sucroferric oxyhydroxide  500 mg Oral TID WC   Continuous Infusions:  sodium thiosulfate 12.5 g in sodium chloride 0.9 % 150 mL Infusion for Calciphylaxis Stopped (03/14/22 0335)     LOS: 134 days    Time spent: 10 mins    Geremiah Fussell, MD [facility]   If 7PM-7AM, please contact night-coverage HistConstipation improved with lactulose. history of irritable bowel syndrome history of irritable bowel syndromeory of irritable bowel syndrome

## 2022-03-16 DIAGNOSIS — I5023 Acute on chronic systolic (congestive) heart failure: Secondary | ICD-10-CM | POA: Diagnosis not present

## 2022-03-16 LAB — TYPE AND SCREEN
ABO/RH(D): O POS
Antibody Screen: NEGATIVE
Unit division: 0

## 2022-03-16 LAB — BPAM RBC
Blood Product Expiration Date: 202401022359
Unit Type and Rh: 5100

## 2022-03-16 MED ORDER — HEPARIN SODIUM (PORCINE) 1000 UNIT/ML IJ SOLN
INTRAMUSCULAR | Status: AC
  Filled 2022-03-16: qty 5

## 2022-03-16 NOTE — Progress Notes (Signed)
PROGRESS NOTE    Courtney Gray  RKY:[id number] DOB: 1970/11/14 DOA: 11/01/2021 PCP: Monico Blitz, MD   Brief Narrative:  This 51 year old female, chronically ill with Morbid obesity, bedbound status, ESRD, PVD status post right AKA, chronic hypoxic respiratory failure on home O2, sacral decubitus ulcer, bacteremia with hepatic and splenic abscess status post splenic and liver drain, third digit of left hand amputation and left AKA, now bilateral AKA, presented to the hospital with progressive dyspnea on exertion in the setting of missed HD.  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    Currently, patient is awaiting placement at dialysis center where she can get to treatment but does not have accepting center so far.  Assessment & Plan:   Principal Problem:   Acute on chronic systolic CHF (congestive heart failure) (HCC) Active Problems:   End stage renal disease (HCC)   Hypertension   Decubitus ulcer of sacral region, stage 3 (HCC)   Type 2 diabetes mellitus with hyperlipidemia (HCC)   Chronic respiratory failure with hypoxia (HCC)   Bacteremia   PVD (peripheral vascular disease) (HCC)   Class 3 obesity (HCC)   Anemia due to chronic kidney disease   Acute on chronic diastolic CHF (congestive heart failure) (HCC)   Hypertensive emergency   Leukocytosis   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HCC)   Pressure injury of skin   PAD (peripheral artery disease) (HCC)   Irritable bowel syndrome (IBS)   Amputation of left middle finger   GERD (gastroesophageal reflux disease)   Nodule of skin of abdomen  End-stage renal disease with volume overload: Patient is currently undergoing hemodialysis but unable to sit in the chair for full treatment. Nephrology is following. She recently underwent tunnel catheter exchange by IR for catheter malfunction.   She need to tolerate hemodialysis for 5 to 6 hours in the  chair to continue outpatient dialysis. PT is in contact with seating specialist with the plan for custom power chair / cushion that will keep pressure off wounds and allowed her to sit for longer periods.   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   Received sodium thiosulfate on 03/09/2022.  She still complains of burning pain around the nodular areas.   Pain medications adjusted. She reports pain is reasonably controlled now.   Deep sacral decubitus wound, early osteomyelitis of right ischium: She is found to have deep wound without overt infection.   CT abdomen pelvis 02/17/22 with some signs of osteomyelitis near right ischium, suspected to chronic osteomyelitis.   ID consulted and patient was started on doxycycline which she could not tolerate.  Doxycycline discontinued.   Watchful waiting has been pursued.   Anemia of chronic kidney disease : Hemoglobin 7.5 >8.0>7.7  Overall stable.  She received erythropoietin and iron. Transfuse If Hb drops below 7.0  Electrolyte imbalance:  Corrected with renal replacement therapy.  Latest potassium 4.0  Hypotension: Continue midodrine with hemodialysis. Currently off antihypertensive medications.  History of IBS: Constipation improved with lactulose.  Type 2 diabetes with hyperlipidemia: Stable. Continue sliding scale.   Peripheral vascular disease / Bilateral BKA: Continue supportive care.  COPD: Stable on home inhalers.   Continue supplemental oxygen at baseline.  Morbid obesity: Patient would benefit from weight loss as an outpatient. Estimated body mass index is 45.6 kg/m as calculated from the following:  Height as of this encounter: 5\' 5"  (1.651 m).   Weight as of this encounter: 124.3 kg.    Chronic Diastolic CHF: On hemodialysis for volume management.    DVT prophylaxis:  Unable to put SCDs.  Patient has multiple swellings in her abdomen and is not willing to continue heparin shots.  Code Status: Full code Family Communication: No family at bed side. Disposition Plan:   Status is: Inpatient Remains inpatient appropriate because: Disposition issues, difficulty with hemodialysis chair.   Consultants:  Nephrology ID  Procedures: None.  Antimicrobials:  Anti-infectives (From admission, onward)    Start     Dose/Rate Route Frequency Ordered Stop   03/08/22 0045  vancomycin (VANCOCIN) IVPB 1000 mg/200 mL premix  Status:  Discontinued       Note to Pharmacy: Give in IR for surgical prophylaxis   1,000 mg 200 mL/hr over 60 Minutes Intravenous  Once 03/07/22 2345 03/07/22 2349   03/07/22 1001  vancomycin (VANCOCIN) IVPB 1000 mg/200 mL premix        over 60 Minutes Intravenous Continuous PRN 03/07/22 1027 03/07/22 1108   03/07/22 0953  vancomycin (VANCOCIN) 1-5 GM/200ML-% IVPB       Note to Pharmacy: Kandy Garrison J: cabinet override      03/07/22 0953 03/07/22 2159   03/04/22 1700  doxycycline (VIBRA-TABS) tablet 100 mg  Status:  Discontinued        100 mg Oral 2 times daily with meals 03/04/22 1519 03/07/22 1348   02/04/22 1637  vancomycin (VANCOCIN) 1-5 GM/200ML-% IVPB       Note to Pharmacy: Roe Coombs H: cabinet override      02/04/22 1637 02/04/22 1758   01/06/22 0600  vancomycin (VANCOCIN) IVPB 1000 mg/200 mL premix        1,000 mg 200 mL/hr over 60 Minutes Intravenous To Surgery 01/05/22 0709 01/07/22 0600   11/28/21 1215  vancomycin (VANCOCIN) IVPB 1000 mg/200 mL premix        1,000 mg 200 mL/hr over 60 Minutes Intravenous  Once 11/28/21 1117 11/28/21 1512   11/26/21 1000  vancomycin (VANCOCIN) IVPB 1000 mg/200 mL premix        1,000 mg 200 mL/hr over 60 Minutes Intravenous On call 11/25/21 1628 11/26/21 1740   11/04/21 1800  ceFEPIme (MAXIPIME) 2 g in sodium chloride 0.9 % 100 mL IVPB        2 g 200 mL/hr over 30 Minutes Intravenous  Every M-W-F (1800) 11/03/21 0952 11/08/21 2359   11/04/21 1200  ceFEPIme (MAXIPIME) 2 g in sodium chloride 0.9 % 100 mL IVPB  Status:  Discontinued        2 g 200 mL/hr over 30 Minutes Intravenous Every M-W-F (Hemodialysis) 11/02/21 0941 11/03/21 0952   11/02/21 1800  ceFEPIme (MAXIPIME) 2 g in sodium chloride 0.9 % 100 mL IVPB        2 g 200 mL/hr over 30 Minutes Intravenous  Once 11/02/21 0941 11/02/21 1812   11/02/21 1200  ceFEPIme (MAXIPIME) 2 g in sodium chloride 0.9 % 100 mL IVPB  Status:  Discontinued        2 g 200 mL/hr over 30 Minutes Intravenous Every T-Th-Sa (Hemodialysis) 11/01/21 1934 11/02/21 0941   11/01/21 2200  metroNIDAZOLE (FLAGYL) tablet 500 mg        500 mg Oral Every 12 hours 11/01/21 1827 11/08/21 2225   11/01/21 1945  ceFEPIme (MAXIPIME) 1 g in sodium chloride 0.9 % 100 mL IVPB  1 g 200 mL/hr over 30 Minutes Intravenous  Once 11/01/21 1933 11/01/21 2032   11/01/21 1730  ceFEPIme (MAXIPIME) 1 g in sodium chloride 0.9 % 100 mL IVPB  Status:  Discontinued        1 g 200 mL/hr over 30 Minutes Intravenous Every 24 hours 11/01/21 1720 11/01/21 1933       Subjective: Patient was seen and examined at bedside. Overnight events noted. Patient still reports pain over abdominal wall and at the site of calciphylaxis. His pain medications were adjusted yesterday.  He states pain is improved.  Objective: Vitals:   03/15/22 2358 03/16/22 0122 03/16/22 0602 03/16/22 0917  BP: (!) 141/66 (!) 102/57 (!) 147/70 123/83  Pulse: 65 81 65 66  Resp: 11 18 18    Temp:  98.4 F (36.9 C) 98.3 F (36.8 C) 98.4 F (36.9 C)  TempSrc:  Oral Oral Oral  SpO2: 100% 100% 100% 100%  Weight:      Height:        Intake/Output Summary (Last 24 hours) at 03/16/2022 1109 Last data filed at 03/16/2022 0034 Gross per 24 hour  Intake 240 ml  Output 1100 ml  Net -860 ml   Filed Weights   03/10/22 0500  Weight: 124.3 kg    Examination:  General exam: Appears comfortable, not in  any acute distress.  Deconditioned. Respiratory system: CTA bilaterally, respiratory effort normal, RR 15. Cardiovascular system: S1-S2 heard, regular rate and rhythm, no murmur. Gastrointestinal system: Abdomen is soft, mildly tender, obese, nodular ulcerated areas,  covered with dressing. Central nervous system: Alert and oriented x 3. No focal neurological deficits. Extremities: Bilateral above-knee amputation. Skin: No rashes, lesions or ulcers Psychiatry: Judgement and insight appear normal. Mood & affect appropriate.     Data Reviewed: I have personally reviewed following labs and imaging studies  CBC: Recent Labs  Lab 03/11/22 1300 03/13/22 2300 03/14/22 1103 03/15/22 0918  WBC 10.4  --   --   --   HGB 7.1* 7.2* 8.0* 7.7*  HCT 24.6* 24.7* 26.9* 25.9*  MCV 98.4  --   --   --   PLT 286  --   --   --    Basic Metabolic Panel: Recent Labs  Lab 03/11/22 1300 03/14/22 1103  NA 138 138  K 4.4 4.0  CL 99 99  CO2 27 27  GLUCOSE 93 110*  BUN 36* 9  CREATININE 11.29* 4.08*  CALCIUM 9.0 9.0   GFR: Estimated Creatinine Clearance: 21.6 mL/min (A) (by C-G formula based on SCr of 4.08 mg/dL (H)). Liver Function Tests: No results for input(s): "AST", "ALT", "ALKPHOS", "BILITOT", "PROT", "ALBUMIN" in the last 168 hours.  No results for input(s): "LIPASE", "AMYLASE" in the last 168 hours. No results for input(s): "AMMONIA" in the last 168 hours. Coagulation Profile: No results for input(s): "INR", "PROTIME" in the last 168 hours. Cardiac Enzymes: No results for input(s): "CKTOTAL", "CKMB", "CKMBINDEX", "TROPONINI" in the last 168 hours. BNP (last 3 results) No results for input(s): "PROBNP" in the last 8760 hours. HbA1C: No results for input(s): "HGBA1C" in the last 72 hours. CBG: No results for input(s): "GLUCAP" in the last 168 hours. Lipid Profile: No results for input(s): "CHOL", "HDL", "LDLCALC", "TRIG", "CHOLHDL", "LDLDIRECT" in the last 72 hours. Thyroid Function  Tests: No results for input(s): "TSH", "T4TOTAL", "FREET4", "T3FREE", "THYROIDAB" in the last 72 hours. Anemia Panel: No results for input(s): "VITAMINB12", "FOLATE", "FERRITIN", "TIBC", "IRON", "RETICCTPCT" in the last 72 hours. Sepsis Labs: No  results for input(s): "PROCALCITON", "LATICACIDVEN" in the last 168 hours.  No results found for this or any previous visit (from the past 240 hour(s)).   Radiology Studies: No results found.  Scheduled Meds:  sodium chloride   Intravenous Once   aspirin EC  81 mg Oral Daily   atorvastatin  10 mg Oral Daily   Chlorhexidine Gluconate Cloth  6 each Topical Q0600   darbepoetin (ARANESP) injection - DIALYSIS  200 mcg Subcutaneous Q Tue-1800   docusate sodium  100 mg Oral BID   famotidine  20 mg Oral Daily   feeding supplement  1 Container Oral TID BM   gabapentin  100 mg Oral Q1200   gabapentin  200 mg Oral QHS   Gerhardt's butt cream   Topical BID   heparin sodium (porcine)       hydroquinone   Topical BID   lactulose  10 g Oral TID   levothyroxine  100 mcg Oral Q0600   loratadine  10 mg Oral Daily   melatonin  3 mg Oral QHS   midodrine  10 mg Oral Once per day on Tue Thu Sat   pantoprazole  40 mg Oral Daily   sevelamer carbonate  4.8 g Oral TID WC   sucroferric oxyhydroxide  500 mg Oral TID WC   Continuous Infusions:  sodium thiosulfate 12.5 g in sodium chloride 0.9 % 150 mL 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 (03/15/22 2305)     LOS: 135 days    Time spent: 35 mins    Alexius Hangartner, MD [facility]   If 7PM-7AM, please contact night-coverage

## 2022-03-16 NOTE — Progress Notes (Signed)
Received patient in bed to unit.  Alert and oriented.  Informed consent signed and in chart.   Treatment initiated: 2040 Treatment completed: 0028  Patient tolerated well.  Transported back to the room  Alert, without acute distress.  Hand-off given to patient's nurse.   Access used: Catheter Access issues: none  Total UF removed: 1100 Medication(s) given: sodium triosulfate and Dilaudid 1 mg Post HD VS: 98 139/50 74 18 100 [place] 2L  Post HD weight: unable to obtain   [facility]

## 2022-03-17 DIAGNOSIS — I5023 Acute on chronic systolic (congestive) heart failure: Secondary | ICD-10-CM | POA: Diagnosis not present

## 2022-03-17 MED ORDER — OXYCODONE HCL 5 MG PO TABS
15.0000 mg | ORAL_TABLET | ORAL | Status: DC | PRN
  Administered 2022-03-17 – 2022-03-23 (×18): 15 mg via ORAL
  Administered 2022-03-23: 10 mg via ORAL
  Filled 2022-03-17 (×20): qty 3

## 2022-03-17 NOTE — Progress Notes (Signed)
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    PT Comments    Continuing work on functional mobility and activity tolerance;  Session focused on functional transfers and power chair practice; Used the [place] cushion this session, which pt seems to prefer, and continued to emphasize the need to recline/tilt to relieve pressure on R ischial wound; Today, pt needed mod assist of 3 to lateral scoot transfer bed to power chair; she needs reminders for rationale to scoot transfer to the L only, and needs directional cues; used many short, inefficient scoots to get to power chair, eventually quite energetically taxing, and needed Mod assist to Max assist for a few of the scoots to get to chair; Recommend taking time to teach the head-hips relationship to help with scooting over the next few sessions;  Plan for Corene Cornea, specialist with [facility], to join [place] next session and further evaluate power chair and cushion options tomorrow, 2023/03/25 at 1:30 pm;   Passed on to Nursing that pt has concerns about her airbed not working properly -- recommend we have maintenance look at it for fixing, or simply switching for another air mattress bed.   Recommendations for follow up therapy are one component of a multi-disciplinary discharge planning process, led by the attending physician.  Recommendations may be updated based on patient status, additional functional criteria and insurance authorization.  Follow Up Recommendations  Skilled nursing-short term rehab (<3 hours/day) Can patient  physically be transported by private vehicle: No   Assistance Recommended at Discharge Intermittent Supervision/Assistance  Patient can return home with the following A lot of help with walking and/or transfers;A lot of help with bathing/dressing/bathroom;Assist for transportation;Help with stairs or ramp for entrance   Equipment Recommendations  Wheelchair (measurements PT);Wheelchair cushion (measurements PT);Other (comment);Hospital bed (more accomodating mechanical lift, with sling that handles pt's with bil AKAs; [place] cushion (20x24), power wheelchair with power tilt and recline)    Recommendations for Other Services       Precautions / Restrictions Precautions Precautions: Fall Precaution Comments: bil AKA, 2L O2 at baseline; when in poer chair, MUST perform pressure relief every 30 minutes by tilting back for 10 minutes Restrictions RUE Weight Bearing: Weight bearing as tolerated LUE Weight Bearing: Weight bearing as tolerated     Mobility  Bed Mobility Overal bed mobility: Modified Independent             General bed mobility comments: pulls on rails to sit upright in bed    Transfers Overall transfer level: Needs assistance   Transfers: Bed to chair/wheelchair/BSC            Lateral/Scoot Transfers: Mod assist, Max assist, +2 physical assistance, +2 safety/equipment (Third person present and helpful) General transfer comment: Used bed pad to minimize shearing; Pt needed Max cues for direction to transfer to the L by scooting; incr time to position bed and power chair optimally, occasionally moving the bed up or down at pt's request, and eventually raising bed to make for a "[place]" transfer; short, ineffiient scoots, and eventually used Max assist    Ambulation/Gait  Stairs             Information systems manager mobility: Yes Wheelchair propulsion:  (Power) Wheelchair parts: Supervision/cueing  (cues to turn power off during transfers) Distance:  (in room and on unit) Education administrator Details (indicate cue type and reason): Reminders for performing pressure relief; set timer for reminder on pt's phone; Ended session with pt exploring the 5th floor with NT; opted to go on Room air rather than waiting for an O2 tank carrier (O2 sats at 97% on room air in power chair at end of session)  Modified Rankin (Stroke Patients Only)       Balance                                            Cognition Arousal/Alertness: Awake/alert Behavior During Therapy: WFL for tasks assessed/performed Overall Cognitive Status: Within Functional Limits for tasks assessed (for simple tasks)                                 General Comments: Concern for pt remembering to perform pressure relief and following through with pressure relief; Needing reminder cues to scoot transfer to L only        Exercises      General Comments General comments (skin integrity, edema, etc.): Educated pt's RN and NT on how to use the [facility] bil amputee sling for back to bed      Pertinent Vitals/Pain Pain Assessment Pain Assessment: Faces Faces Pain Scale: Hurts a little bit Pain Location: abdomen wounds Pain Descriptors / Indicators: Grimacing, Discomfort Pain Intervention(s): Monitored during session, Premedicated before session    Home Living                          Prior Function            PT Goals (current goals can now be found in the care plan section) Acute Rehab PT Goals Patient Stated Goal: Get up and use power chair PT Goal Formulation: With patient Time For Goal Achievement: 03/27/22 Potential to Achieve Goals: Good Progress towards PT goals: Progressing toward goals    Frequency    Min 2X/week      PT Plan Current plan remains appropriate    Co-evaluation              AM-PAC PT "6 Clicks" Mobility   Outcome Measure  Help  needed turning from your back to your side while in a flat bed without using bedrails?: None Help needed moving from lying on your back to sitting on the side of a flat bed without using bedrails?: None Help needed moving to and from a bed to a chair (including a wheelchair)?: A Lot Help needed standing up from a chair using your arms (e.g., wheelchair or bedside chair)?: Total Help needed to walk in hospital room?: Total Help needed climbing 3-5 steps with a railing? : Total 6 Click Score: 13    End of Session   Activity Tolerance: Patient tolerated treatment well Patient left: Other (comment) (In power chair,) Nurse Communication: Mobility status;Need for lift equipment PT Visit Diagnosis: Other abnormalities of gait and mobility (R26.89);Muscle weakness (generalized) (M62.81) Pain - Right/Left: Right Pain - part of body: Hip;Leg (ischial tuberosity; R abdominal nodular pain)  Time: 1340-1435 (minus 10 minutes to show NT amputee lift pad) PT Time Calculation (min) (ACUTE ONLY): 55 min  Charges:  $Therapeutic Activity: 23-37 mins $Wheel Chair Management: 8-22 mins                     Roney Marion, PT  Acute Rehabilitation Services Office [phone]    Colletta [place] 03/17/2022, 5:26 PM

## 2022-03-17 NOTE — Progress Notes (Cosign Needed)
[name] Progress Note   Subjective:   No nephrology related concerns but reports her bed is very uncomfortable and pain is not well managed- says she already talked to her attending MD. No SOB, CP, dizziness or nausea.   Objective Vitals:   03/16/22 1731 03/16/22 2101 03/17/22 0621 03/17/22 0927  BP: (!) 140/50 (!) 147/66 128/72 (!) 127/55  Pulse: 66 65 67 68  Resp:  _0 Temp: 98.5 F (36.9 C) 98.7 F (37.1 C) 98.6 F (37 C) 97.9 F (36.6 C)  TempSrc: Oral Oral Oral Oral  SpO2: 100% 100% 100% 100%  Weight:      Height:       Physical Exam General: Chronically ill appearing female, alert and in NAD Heart: RRR, no murmurs, rubs or gallops Lungs: CTA bilaterally without wheezing, rhonchi or rales Abdomen:Soft, non-distended ,+ wounds on abdomen Extremities: No edema b/l AKAs Dialysis Access: [facility]  Additional Objective Labs: Basic Metabolic Panel: Recent Labs  Lab 03/11/22 1300 03/14/22 1103  NA 138 138  K 4.4 4.0  CL 99 99  CO2 27 27  GLUCOSE 93 110*  BUN 36* 9  CREATININE 11.29* 4.08*  CALCIUM 9.0 9.0   Liver Function Tests: No results for input(s): "AST", "ALT", "ALKPHOS", "BILITOT", "PROT", "ALBUMIN" in the last 168 hours. No results for input(s): "LIPASE", "AMYLASE" in the last 168 hours. CBC: Recent Labs  Lab 03/11/22 1300 03/13/22 2300 03/14/22 1103 03/15/22 0918  WBC 10.4  --   --   --   HGB 7.1* 7.2* 8.0* 7.7*  HCT 24.6* 24.7* 26.9* 25.9*  MCV 98.4  --   --   --   PLT 286  --   --   --    Blood Culture    Component Value Date/Time   SDES BLOOD LEFT ANTECUBITAL 10/16/2021 1534   SPECREQUEST  10/16/2021 1534    BOTTLES DRAWN AEROBIC AND ANAEROBIC Blood Culture results may not be optimal due to an inadequate volume of blood received in culture bottles   CULT  10/16/2021 1534    NO GROWTH 5 DAYS Performed at [facility] [address], [place], [place] [address]    REPTSTATUS 10/21/2021 FINAL 10/16/2021 1534     Cardiac Enzymes: No results for input(s): "CKTOTAL", "CKMB", "CKMBINDEX", "TROPONINI" in the last 168 hours. CBG: No results for input(s): "GLUCAP" in the last 168 hours. Iron Studies: No results for input(s): "IRON", "TIBC", "TRANSFERRIN", "FERRITIN" in the last 72 hours. _1 @ Studies/Results: No results found. Medications:  sodium thiosulfate 12.5 g in sodium chloride 0.9 % 150 mL 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 (03/15/22 2305)    sodium chloride   Intravenous Once   aspirin EC  81 mg Oral Daily   atorvastatin  10 mg Oral Daily   Chlorhexidine Gluconate Cloth  6 each Topical Q0600   darbepoetin (ARANESP) injection - DIALYSIS  200 mcg Subcutaneous Q Tue-1800   docusate sodium  100 mg Oral BID   famotidine  20 mg Oral Daily   feeding supplement  1 Container Oral TID BM   gabapentin  100 mg Oral Q1200   gabapentin  200 mg Oral QHS   Gerhardt's butt cream   Topical BID   hydroquinone   Topical BID   lactulose  10 g Oral TID   levothyroxine  100 mcg Oral Q0600   loratadine  10 mg Oral Daily   melatonin  3 mg Oral QHS   midodrine  10 mg Oral  Once per day on Tue Thu Sat   pantoprazole  40 mg Oral Daily   sevelamer carbonate  4.8 g Oral TID WC   sucroferric oxyhydroxide  500 mg Oral TID WC     Assessment/Plan: Inability to obtain HD at OP center D/T body habitus and wound: Difficult situation with no clear answers yet. PT working with pt -  met with seating specialist to get evaluated for custom chair/cushion that will allow her sit for longer periods.  Appreciate efforts from PT team.  ESRD: Usual TTS schedule - was refusing HD at times - has been discussed extensively. New TDC placed 02/04/2022, TDC stopped working again 11/30. IR consulted - S/p venogram, angioplasty of SVC and innominate veins, and TDC exchange. 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 Started empirically on sodium thiosulfate, but caused vomiting so stopped - now getting 1/2 dose - 12.38m q HD. Not on any calcium based products or vitamin D. Needs better control of Phos - have added Velphoro as well. HypoTN/volume: On midodrine 144mqHD, with 1020mrn mid HD w/parameters to keep SBP >100 d/t becoming symptomatic when it drops lower. Significant weight loss this admit. Severe ischial sacral decubitus wound: CT showing chronic osteomyelitis. Limiting her, noted to be slow healing. Goal to tolerate sitting upright - which she is doing with PT.  ID started doxycycline which caused diarrhea - now off.  COPD/Chronic O2 use: Prior O2 at home (5L/min) -> tapered to 2-3L/min here Anemia of ESRD: Hgb down to 7.1 on 12/7 - up to 7.7 - on max dose Aranesp 200m18meekly. Recent Fe load completed. Secondary hyperparathyroidism: Phos high again - ordered for Renvela 4.8g AC TID, refusing/missing at times. Added Velphoro 500mg58m to regimen. CorrCa elevated. Not on VDRA, last PTH 86 (low) on 03/11/22. Avoid all calcium based medications for Vitamin D d/t concern for calciphylaxis.  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 Nutrition - diet continues to be changed from renal.  Currently on regular diet.  Would really prefer she be on a renal diet but patient has been refusing Disposition: Difficult situation. Prior to admit, patient required 6 staff members and 2 hoyer pads for transfer into HD chair. Could not stay on HD for full tx. 2/2 pain of multiple decubitus ulcers and PTAR unable to lift pt into ambulance for transport.SW working on LTC at SNF. Con-wayl need to tolerate 5-6hrs in a chair to tolerate OP dialysis including transport, treatment and cannulation time.  The main issue is her Rt ischial wound. PT has arranged seating specialist with plan for custom power chair/cushion that would keep pressure off wounds and allow her to sit for longer periods      SamanAnice PaganiniC 03/17/2022, 9:44 AM  [facility] Pager: [phone]

## 2022-03-17 NOTE — Progress Notes (Signed)
PROGRESS NOTE    Courtney Gray  FAO:[id number] DOB: 1970-12-11 DOA: 11/01/2021 PCP: Monico Blitz, MD   Brief Narrative:  This 51 year old female, chronically ill with Morbid obesity, bedbound status, ESRD, PVD status post right AKA, chronic hypoxic respiratory failure on home O2, sacral decubitus ulcer, bacteremia with hepatic and splenic abscess status post splenic and liver drain, third digit of left hand amputation and left AKA, now bilateral AKA, presented to the hospital with progressive dyspnea on exertion in the setting of missed HD.  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    Currently, patient is awaiting placement at dialysis center where she can get to treatment but does not have accepting center so far.  Assessment & Plan:   Principal Problem:   Acute on chronic systolic CHF (congestive heart failure) (HCC) Active Problems:   End stage renal disease (HCC)   Hypertension   Decubitus ulcer of sacral region, stage 3 (HCC)   Type 2 diabetes mellitus with hyperlipidemia (HCC)   Chronic respiratory failure with hypoxia (HCC)   Bacteremia   PVD (peripheral vascular disease) (HCC)   Class 3 obesity (HCC)   Anemia due to chronic kidney disease   Acute on chronic diastolic CHF (congestive heart failure) (HCC)   Hypertensive emergency   Leukocytosis   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HCC)   Pressure injury of skin   PAD (peripheral artery disease) (HCC)   Irritable bowel syndrome (IBS)   Amputation of left middle finger   GERD (gastroesophageal reflux disease)   Nodule of skin of abdomen  End-stage renal disease with volume overload: Patient is currently undergoing hemodialysis but unable to sit in the chair for full treatment. Nephrology is following. She recently underwent tunnelled catheter exchange by IR for catheter malfunction.   She need to tolerate hemodialysis for 5 to 6 hours in the  chair to continue outpatient dialysis. PT is in contact with seating specialist with the plan for custom power chair / cushion that will keep pressure off wounds and allowed her to sit for longer periods.   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   Received sodium thiosulfate on 03/09/2022.  She still complains of burning pain around the nodular areas.   Pain medications adjusted.  Increase oxycodone to 15 mg every 4 hours as needed.   Deep sacral decubitus wound, early osteomyelitis of right ischium: She is found to have deep wound without overt infection.   CT abdomen pelvis 02/17/22 with some signs of osteomyelitis near right ischium, suspected to chronic osteomyelitis.   ID consulted and patient was started on doxycycline which she could not tolerate.  Doxycycline discontinued.   Watchful waiting has been pursued.   Anemia of chronic kidney disease : Hemoglobin 7.5 >8.0>7.7  Overall stable.  She received erythropoietin and iron. Transfuse If Hb drops below 7.0  Electrolyte imbalance:  Corrected with renal replacement therapy.  Latest potassium 4.0  Hypotension: Continue midodrine with hemodialysis. Currently off antihypertensive medications.  History of IBS: Constipation improved with lactulose.  Type 2 diabetes with hyperlipidemia: Stable. Continue sliding scale.   Peripheral vascular disease / Bilateral BKA: Continue supportive care.  COPD: Stable on home inhalers.   Continue supplemental oxygen at baseline.  Morbid obesity: Patient would benefit from weight loss as an outpatient. Estimated body mass index is 45.6 kg/m as calculated from  the following:   Height as of this encounter: 5\' 5"  (1.651 m).   Weight as of this encounter: 124.3 kg.    Chronic Diastolic CHF: On hemodialysis for volume management.    DVT  prophylaxis: Unable to put SCDs.  Patient has multiple swellings in her abdomen and is not willing to continue heparin shots.  Code Status: Full code Family Communication: No family at bed side. Disposition Plan:   Status is: Inpatient Remains inpatient appropriate because: Disposition issues, difficulty with hemodialysis chair.   Consultants:  Nephrology ID  Procedures: None.  Antimicrobials:  Anti-infectives (From admission, onward)    Start     Dose/Rate Route Frequency Ordered Stop   03/08/22 0045  vancomycin (VANCOCIN) IVPB 1000 mg/200 mL premix  Status:  Discontinued       Note to Pharmacy: Give in IR for surgical prophylaxis   1,000 mg 200 mL/hr over 60 Minutes Intravenous  Once 03/07/22 2345 03/07/22 2349   03/07/22 1001  vancomycin (VANCOCIN) IVPB 1000 mg/200 mL premix        over 60 Minutes Intravenous Continuous PRN 03/07/22 1027 03/07/22 1108   03/07/22 0953  vancomycin (VANCOCIN) 1-5 GM/200ML-% IVPB       Note to Pharmacy: Kandy Garrison J: cabinet override      03/07/22 0953 03/07/22 2159   03/04/22 1700  doxycycline (VIBRA-TABS) tablet 100 mg  Status:  Discontinued        100 mg Oral 2 times daily with meals 03/04/22 1519 03/07/22 1348   02/04/22 1637  vancomycin (VANCOCIN) 1-5 GM/200ML-% IVPB       Note to Pharmacy: Roe Coombs H: cabinet override      02/04/22 1637 02/04/22 1758   01/06/22 0600  vancomycin (VANCOCIN) IVPB 1000 mg/200 mL premix        1,000 mg 200 mL/hr over 60 Minutes Intravenous To Surgery 01/05/22 0709 01/07/22 0600   11/28/21 1215  vancomycin (VANCOCIN) IVPB 1000 mg/200 mL premix        1,000 mg 200 mL/hr over 60 Minutes Intravenous  Once 11/28/21 1117 11/28/21 1512   11/26/21 1000  vancomycin (VANCOCIN) IVPB 1000 mg/200 mL premix        1,000 mg 200 mL/hr over 60 Minutes Intravenous On call 11/25/21 1628 11/26/21 1740   11/04/21 1800  ceFEPIme (MAXIPIME) 2 g in sodium chloride 0.9 % 100 mL IVPB        2 g 200 mL/hr over 30 Minutes  Intravenous Every M-W-F (1800) 11/03/21 0952 11/08/21 2359   11/04/21 1200  ceFEPIme (MAXIPIME) 2 g in sodium chloride 0.9 % 100 mL IVPB  Status:  Discontinued        2 g 200 mL/hr over 30 Minutes Intravenous Every M-W-F (Hemodialysis) 11/02/21 0941 11/03/21 0952   11/02/21 1800  ceFEPIme (MAXIPIME) 2 g in sodium chloride 0.9 % 100 mL IVPB        2 g 200 mL/hr over 30 Minutes Intravenous  Once 11/02/21 0941 11/02/21 1812   11/02/21 1200  ceFEPIme (MAXIPIME) 2 g in sodium chloride 0.9 % 100 mL IVPB  Status:  Discontinued        2 g 200 mL/hr over 30 Minutes Intravenous Every T-Th-Sa (Hemodialysis) 11/01/21 1934 11/02/21 0941   11/01/21 2200  metroNIDAZOLE (FLAGYL) tablet 500 mg        500 mg Oral Every 12 hours 11/01/21 1827 11/08/21 2225   11/01/21 1945  ceFEPIme (MAXIPIME) 1 g in sodium chloride 0.9 % 100 mL IVPB  1 g 200 mL/hr over 30 Minutes Intravenous  Once 11/01/21 1933 11/01/21 2032   11/01/21 1730  ceFEPIme (MAXIPIME) 1 g in sodium chloride 0.9 % 100 mL IVPB  Status:  Discontinued        1 g 200 mL/hr over 30 Minutes Intravenous Every 24 hours 11/01/21 1720 11/01/21 1933       Subjective: Patient was seen and examined at bedside. Overnight events noted. Patient still reports pain in the  abdominal wall and at the site of calciphylaxis. She still complaining of pain asking to increase pain medication.  Objective: Vitals:   03/16/22 1731 03/16/22 2101 03/17/22 0621 03/17/22 0927  BP: (!) 140/50 (!) 147/66 128/72 (!) 127/55  Pulse: 66 65 67 68  Resp:  18 18 18   Temp: 98.5 F (36.9 C) 98.7 F (37.1 C) 98.6 F (37 C) 97.9 F (36.6 C)  TempSrc: Oral Oral Oral Oral  SpO2: 100% 100% 100% 100%  Weight:      Height:        Intake/Output Summary (Last 24 hours) at 03/17/2022 1308 Last data filed at 03/16/2022 2101 Gross per 24 hour  Intake 240 ml  Output 0 ml  Net 240 ml   Filed Weights   03/10/22 0500  Weight: 124.3 kg    Examination:  General exam:  Appears comfortable, not in any acute distress.  Deconditioned. Respiratory system: CTA bilaterally, respiratory effort normal, RR 13. Cardiovascular system: S1-S2 heard, regular rate and rhythm, no murmur. Gastrointestinal system: Abdomen is soft, mildly tender, obese, nodular ulcerated areas, covered with dressing. Central nervous system: Alert and oriented x 3. No focal neurological deficits. Extremities: Bilateral above-knee amputation. Skin: No rashes, lesions or ulcers Psychiatry: Judgement and insight appear normal. Mood & affect appropriate.     Data Reviewed: I have personally reviewed following labs and imaging studies  CBC: Recent Labs  Lab 03/11/22 1300 03/13/22 2300 03/14/22 1103 03/15/22 0918  WBC 10.4  --   --   --   HGB 7.1* 7.2* 8.0* 7.7*  HCT 24.6* 24.7* 26.9* 25.9*  MCV 98.4  --   --   --   PLT 286  --   --   --    Basic Metabolic Panel: Recent Labs  Lab 03/11/22 1300 03/14/22 1103  NA 138 138  K 4.4 4.0  CL 99 99  CO2 27 27  GLUCOSE 93 110*  BUN 36* 9  CREATININE 11.29* 4.08*  CALCIUM 9.0 9.0   GFR: Estimated Creatinine Clearance: 21.6 mL/min (A) (by C-G formula based on SCr of 4.08 mg/dL (H)). Liver Function Tests: No results for input(s): "AST", "ALT", "ALKPHOS", "BILITOT", "PROT", "ALBUMIN" in the last 168 hours.  No results for input(s): "LIPASE", "AMYLASE" in the last 168 hours. No results for input(s): "AMMONIA" in the last 168 hours. Coagulation Profile: No results for input(s): "INR", "PROTIME" in the last 168 hours. Cardiac Enzymes: No results for input(s): "CKTOTAL", "CKMB", "CKMBINDEX", "TROPONINI" in the last 168 hours. BNP (last 3 results) No results for input(s): "PROBNP" in the last 8760 hours. HbA1C: No results for input(s): "HGBA1C" in the last 72 hours. CBG: No results for input(s): "GLUCAP" in the last 168 hours. Lipid Profile: No results for input(s): "CHOL", "HDL", "LDLCALC", "TRIG", "CHOLHDL", "LDLDIRECT" in the last  72 hours. Thyroid Function Tests: No results for input(s): "TSH", "T4TOTAL", "FREET4", "T3FREE", "THYROIDAB" in the last 72 hours. Anemia Panel: No results for input(s): "VITAMINB12", "FOLATE", "FERRITIN", "TIBC", "IRON", "RETICCTPCT" in the last 72 hours. Sepsis  Labs: No results for input(s): "PROCALCITON", "LATICACIDVEN" in the last 168 hours.  No results found for this or any previous visit (from the past 240 hour(s)).   Radiology Studies: No results found.  Scheduled Meds:  sodium chloride   Intravenous Once   aspirin EC  81 mg Oral Daily   atorvastatin  10 mg Oral Daily   Chlorhexidine Gluconate Cloth  6 each Topical Q0600   darbepoetin (ARANESP) injection - DIALYSIS  200 mcg Subcutaneous Q Tue-1800   docusate sodium  100 mg Oral BID   famotidine  20 mg Oral Daily   feeding supplement  1 Container Oral TID BM   gabapentin  100 mg Oral Q1200   gabapentin  200 mg Oral QHS   Gerhardt's butt cream   Topical BID   hydroquinone   Topical BID   lactulose  10 g Oral TID   levothyroxine  100 mcg Oral Q0600   loratadine  10 mg Oral Daily   melatonin  3 mg Oral QHS   midodrine  10 mg Oral Once per day on Tue Thu Sat   pantoprazole  40 mg Oral Daily   sevelamer carbonate  4.8 g Oral TID WC   sucroferric oxyhydroxide  500 mg Oral TID WC   Continuous Infusions:  sodium thiosulfate 12.5 g in sodium chloride 0.9 % 150 mL 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

## 2022-03-18 DIAGNOSIS — I5023 Acute on chronic systolic (congestive) heart failure: Secondary | ICD-10-CM | POA: Diagnosis not present

## 2022-03-18 MED ORDER — OXYCODONE HCL ER 10 MG PO T12A
10.0000 mg | EXTENDED_RELEASE_TABLET | Freq: Two times a day (BID) | ORAL | Status: DC
  Administered 2022-03-18 – 2022-03-23 (×10): 10 mg via ORAL
  Filled 2022-03-18 (×11): qty 1

## 2022-03-18 NOTE — Progress Notes (Signed)
PROGRESS NOTE    Courtney [name]  HCW:[id number] DOB: May 17, 1970 DOA: 11/01/2021 PCP: Monico Blitz, MD   Brief Narrative:  This 51 year old female, chronically ill with Morbid obesity, bedbound status, ESRD, PVD status post right AKA, chronic hypoxic respiratory failure on home O2, sacral decubitus ulcer, bacteremia with hepatic and splenic abscess status post splenic and liver drain, third digit of left hand amputation and left AKA, now bilateral AKA, presented to the hospital with progressive dyspnea on exertion in the setting of missed HD. 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  Currently, patient is awaiting placement at dialysis center where she can get to treatment but does not have accepting center so far.  Assessment & Plan:   Principal Problem:   Acute on chronic systolic CHF (congestive heart failure) (HCC) Active Problems:   End stage renal disease (HCC)   Hypertension   Decubitus ulcer of sacral region, stage 3 (HCC)   Type 2 diabetes mellitus with hyperlipidemia (HCC)   Chronic respiratory failure with hypoxia (HCC)   Bacteremia   PVD (peripheral vascular disease) (HCC)   Class 3 obesity (HCC)   Anemia due to chronic kidney disease   Acute on chronic diastolic CHF (congestive heart failure) (HCC)   Hypertensive emergency   Leukocytosis   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HCC)   Pressure injury of skin   PAD (peripheral artery disease) (HCC)   Irritable bowel syndrome (IBS)   Amputation of left middle finger   GERD (gastroesophageal reflux disease)   Nodule of skin of abdomen  End-stage renal disease with volume overload: Patient is currently undergoing hemodialysis but unable to sit in the chair for full treatment due to body habitus and pain. Nephrology is following. She recently underwent tunnelled catheter exchange by IR for catheter malfunction.   She need to tolerate hemodialysis  for 5 to 6 hours in the chair to continue outpatient dialysis. PT is in contact with seating specialist with the plan for custom power chair / cushion that will keep pressure off wounds and allowed her to sit for longer periods.   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   Received sodium thiosulfate on 03/09/2022.  She still complains of burning pain around the nodular areas.   Pain medications adjusted.  Increased oxycodone to 15 mg every 4 hours as needed. Dilaudid discontinued, started on OxyContin 10 mg every 12 hours. Continue to monitor pain.   Deep sacral decubitus wound, early osteomyelitis of right ischium: She is found to have deep wound without overt infection.   CT abdomen pelvis 02/17/22 with some signs of osteomyelitis near right ischium, suspected to chronic osteomyelitis.   ID consulted and patient was started on doxycycline which she could not tolerate.  Doxycycline discontinued.   Watchful waiting has been pursued.   Anemia of chronic kidney disease : Hemoglobin 7.5 >8.0>7.7  Overall stable.  She received erythropoietin and iron. Transfuse If Hb drops below 7.0  Electrolyte imbalance:  Corrected with renal replacement therapy.  Latest potassium 4.0  Hypotension: Continue midodrine with hemodialysis. Currently off antihypertensive medications.  History of IBS: Constipation improved with lactulose.  Type 2 diabetes with hyperlipidemia: Stable. Continue sliding scale.   Peripheral vascular disease / Bilateral BKA: Continue supportive care.  COPD: Stable on home inhalers.   Continue supplemental oxygen at baseline.  Morbid obesity: Patient would  benefit from weight loss as an outpatient. Estimated body mass index is 42.43 kg/m as calculated from the following:   Height as of this encounter: 5\' 5"  (1.651 m).   Weight  as of this encounter: 115.7 kg.    Chronic Diastolic CHF: On hemodialysis for volume management.    DVT prophylaxis: Unable to put SCDs.  Patient has multiple swellings in her abdomen and is not willing to continue heparin shots.  Code Status: Full code Family Communication: No family at bed side. Disposition Plan:   Status is: Inpatient Remains inpatient appropriate because: Disposition issues, difficulty with hemodialysis chair.   Consultants:  Nephrology ID  Procedures: None.  Antimicrobials:  Anti-infectives (From admission, onward)    Start     Dose/Rate Route Frequency Ordered Stop   03/08/22 0045  vancomycin (VANCOCIN) IVPB 1000 mg/200 mL premix  Status:  Discontinued       Note to Pharmacy: Give in IR for surgical prophylaxis   1,000 mg 200 mL/hr over 60 Minutes Intravenous  Once 03/07/22 2345 03/07/22 2349   03/07/22 1001  vancomycin (VANCOCIN) IVPB 1000 mg/200 mL premix        over 60 Minutes Intravenous Continuous PRN 03/07/22 1027 03/07/22 1108   03/07/22 0953  vancomycin (VANCOCIN) 1-5 GM/200ML-% IVPB       Note to Pharmacy: Kandy Garrison J: cabinet override      03/07/22 0953 03/07/22 2159   03/04/22 1700  doxycycline (VIBRA-TABS) tablet 100 mg  Status:  Discontinued        100 mg Oral 2 times daily with meals 03/04/22 1519 03/07/22 1348   02/04/22 1637  vancomycin (VANCOCIN) 1-5 GM/200ML-% IVPB       Note to Pharmacy: Roe Coombs H: cabinet override      02/04/22 1637 02/04/22 1758   01/06/22 0600  vancomycin (VANCOCIN) IVPB 1000 mg/200 mL premix        1,000 mg 200 mL/hr over 60 Minutes Intravenous To Surgery 01/05/22 0709 01/07/22 0600   11/28/21 1215  vancomycin (VANCOCIN) IVPB 1000 mg/200 mL premix        1,000 mg 200 mL/hr over 60 Minutes Intravenous  Once 11/28/21 1117 11/28/21 1512   11/26/21 1000  vancomycin (VANCOCIN) IVPB 1000 mg/200 mL premix        1,000 mg 200 mL/hr over 60 Minutes Intravenous On call 11/25/21 1628 11/26/21 1740    11/04/21 1800  ceFEPIme (MAXIPIME) 2 g in sodium chloride 0.9 % 100 mL IVPB        2 g 200 mL/hr over 30 Minutes Intravenous Every M-W-F (1800) 11/03/21 0952 11/08/21 2359   11/04/21 1200  ceFEPIme (MAXIPIME) 2 g in sodium chloride 0.9 % 100 mL IVPB  Status:  Discontinued        2 g 200 mL/hr over 30 Minutes Intravenous Every M-W-F (Hemodialysis) 11/02/21 0941 11/03/21 0952   11/02/21 1800  ceFEPIme (MAXIPIME) 2 g in sodium chloride 0.9 % 100 mL IVPB        2 g 200 mL/hr over 30 Minutes Intravenous  Once 11/02/21 0941 11/02/21 1812   11/02/21 1200  ceFEPIme (MAXIPIME) 2 g in sodium chloride 0.9 % 100 mL IVPB  Status:  Discontinued        2 g 200 mL/hr over 30 Minutes Intravenous Every T-Th-Sa (Hemodialysis) 11/01/21 1934 11/02/21 0941   11/01/21 2200  metroNIDAZOLE (FLAGYL) tablet 500 mg        500 mg Oral Every 12 hours 11/01/21 1827 11/08/21 2225  11/01/21 1945  ceFEPIme (MAXIPIME) 1 g in sodium chloride 0.9 % 100 mL IVPB        1 g 200 mL/hr over 30 Minutes Intravenous  Once 11/01/21 1933 11/01/21 2032   11/01/21 1730  ceFEPIme (MAXIPIME) 1 g in sodium chloride 0.9 % 100 mL IVPB  Status:  Discontinued        1 g 200 mL/hr over 30 Minutes Intravenous Every 24 hours 11/01/21 1720 11/01/21 1933       Subjective: Patient was seen and examined at bedside. Overnight events noted. Patient still reports having pain in the abdominal wall and at the site of calciphylaxis. She still complaining of pain asking to increase pain medication.  Objective: Vitals:   03/17/22 1951 03/18/22 0500 03/18/22 0701 03/18/22 0853  BP: (!) 125/90  (!) 122/94 (!) 120/35  Pulse: 79  60 69  Resp: 18  18 18   Temp: 98.2 F (36.8 C)  98.2 F (36.8 C) 98 F (36.7 C)  TempSrc: Oral  Oral Oral  SpO2: 100%  100% 98%  Weight:  115.7 kg    Height:        Intake/Output Summary (Last 24 hours) at 03/18/2022 1205 Last data filed at 03/18/2022 1128 Gross per 24 hour  Intake 360 ml  Output --  Net 360 ml    Filed Weights   03/10/22 0500 03/18/22 0500  Weight: 124.3 kg 115.7 kg    Examination:  General exam: Appears comfortable, not in any acute distress deconditioned. Respiratory system: CTA bilaterally, respiratory effort normal, RR 13. Cardiovascular system: S1-S2 heard, regular rate and rhythm, no murmur. Gastrointestinal system: Abdomen is soft, mildly tender, obese, nodular ulcerated areas, covered with dressing. Central nervous system: Alert and oriented x 3. No focal neurological deficits. Extremities: Bilateral AKA. Skin: Nodular ulcerated areas in abdomen. Psychiatry: Judgement and insight appear normal. Mood & affect appropriate.     Data Reviewed: I have personally reviewed following labs and imaging studies  CBC: Recent Labs  Lab 03/11/22 1300 03/13/22 2300 03/14/22 1103 03/15/22 0918  WBC 10.4  --   --   --   HGB 7.1* 7.2* 8.0* 7.7*  HCT 24.6* 24.7* 26.9* 25.9*  MCV 98.4  --   --   --   PLT 286  --   --   --    Basic Metabolic Panel: Recent Labs  Lab 03/11/22 1300 03/14/22 1103  NA 138 138  K 4.4 4.0  CL 99 99  CO2 27 27  GLUCOSE 93 110*  BUN 36* 9  CREATININE 11.29* 4.08*  CALCIUM 9.0 9.0   GFR: Estimated Creatinine Clearance: 20.7 mL/min (A) (by C-G formula based on SCr of 4.08 mg/dL (H)). Liver Function Tests: No results for input(s): "AST", "ALT", "ALKPHOS", "BILITOT", "PROT", "ALBUMIN" in the last 168 hours.  No results for input(s): "LIPASE", "AMYLASE" in the last 168 hours. No results for input(s): "AMMONIA" in the last 168 hours. Coagulation Profile: No results for input(s): "INR", "PROTIME" in the last 168 hours. Cardiac Enzymes: No results for input(s): "CKTOTAL", "CKMB", "CKMBINDEX", "TROPONINI" in the last 168 hours. BNP (last 3 results) No results for input(s): "PROBNP" in the last 8760 hours. HbA1C: No results for input(s): "HGBA1C" in the last 72 hours. CBG: No results for input(s): "GLUCAP" in the last 168 hours. Lipid  Profile: No results for input(s): "CHOL", "HDL", "LDLCALC", "TRIG", "CHOLHDL", "LDLDIRECT" in the last 72 hours. Thyroid Function Tests: No results for input(s): "TSH", "T4TOTAL", "FREET4", "T3FREE", "THYROIDAB" in the  last 72 hours. Anemia Panel: No results for input(s): "VITAMINB12", "FOLATE", "FERRITIN", "TIBC", "IRON", "RETICCTPCT" in the last 72 hours. Sepsis Labs: No results for input(s): "PROCALCITON", "LATICACIDVEN" in the last 168 hours.  No results found for this or any previous visit (from the past 240 hour(s)).   Radiology Studies: No results found.  Scheduled Meds:  sodium chloride   Intravenous Once   aspirin EC  81 mg Oral Daily   atorvastatin  10 mg Oral Daily   Chlorhexidine Gluconate Cloth  6 each Topical Q0600   darbepoetin (ARANESP) injection - DIALYSIS  200 mcg Subcutaneous Q Tue-1800   docusate sodium  100 mg Oral BID   famotidine  20 mg Oral Daily   feeding supplement  1 Container Oral TID BM   gabapentin  100 mg Oral Q1200   gabapentin  200 mg Oral QHS   Gerhardt's butt cream   Topical BID   hydroquinone   Topical BID   lactulose  10 g Oral TID   levothyroxine  100 mcg Oral Q0600   loratadine  10 mg Oral Daily   melatonin  3 mg Oral QHS   midodrine  10 mg Oral Once per day on Tue Thu Sat   oxyCODONE  10 mg Oral Q12H   pantoprazole  40 mg Oral Daily   sevelamer carbonate  4.8 g Oral TID WC   sucroferric oxyhydroxide  500 mg Oral TID WC   Continuous Infusions:  sodium thiosulfate 12.5 g in sodium chloride 0.9 % 150 mL 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 (03/15/22 2305)     LOS: 137 days    Time spent: 35 mins    Courtney Feeser, MD [facility]   If 7PM-7AM, please contact night-coverage

## 2022-03-18 NOTE — Progress Notes (Signed)
Attempted to call patient to dialysis. Dialysis had an available bed and patient refused to come. Will attempt if another bay becomes available.

## 2022-03-18 NOTE — Progress Notes (Signed)
[facility] Progress Note   Subjective:  Seen in room, having ongoing pain in ischial wound and abdomen. Discussed increasing sodium thiosulfate to full dose and she declines, reports it caused too much nausea. Denies SOB, CP, dizziness and nausea. Excited to work with PT/get in chair today.    Objective Vitals:   03/17/22 1951 03/18/22 0500 03/18/22 0701 03/18/22 0853  BP: (!) 125/90  (!) 122/94 (!) 120/35  Pulse: 79  60 69  Resp: _0 Temp: 98.2 F (36.8 C)  98.2 F (36.8 C) 98 F (36.7 C)  TempSrc: Oral  Oral Oral  SpO2: 100%  100% 98%  Weight:  115.7 kg    Height:       Physical Exam General: Chronically ill appearing female, alert and in NAD Heart: RRR, no murmurs, rubs or gallops Lungs: CTA bilaterally without wheezing, rhonchi or rales Abdomen:Soft, non-distended ,+ wounds on abdomen Extremities: No edema b/l AKAs Dialysis Access: [facility]  Additional Objective Labs: Basic Metabolic Panel: Recent Labs  Lab 03/11/22 1300 03/14/22 1103  NA 138 138  K 4.4 4.0  CL 99 99  CO2 27 27  GLUCOSE 93 110*  BUN 36* 9  CREATININE 11.29* 4.08*  CALCIUM 9.0 9.0   Liver Function Tests: No results for input(s): "AST", "ALT", "ALKPHOS", "BILITOT", "PROT", "ALBUMIN" in the last 168 hours. No results for input(s): "LIPASE", "AMYLASE" in the last 168 hours. CBC: Recent Labs  Lab 03/11/22 1300 03/13/22 2300 03/14/22 1103 03/15/22 0918  WBC 10.4  --   --   --   HGB 7.1* 7.2* 8.0* 7.7*  HCT 24.6* 24.7* 26.9* 25.9*  MCV 98.4  --   --   --   PLT 286  --   --   --    Blood Culture    Component Value Date/Time   SDES BLOOD LEFT ANTECUBITAL 10/16/2021 1534   SPECREQUEST  10/16/2021 1534    BOTTLES DRAWN AEROBIC AND ANAEROBIC Blood Culture results may not be optimal due to an inadequate volume of blood received in culture bottles   CULT  10/16/2021 1534    NO GROWTH 5 DAYS Performed at [facility] [address], [place], [place] [address]     REPTSTATUS 10/21/2021 FINAL 10/16/2021 1534    Cardiac Enzymes: No results for input(s): "CKTOTAL", "CKMB", "CKMBINDEX", "TROPONINI" in the last 168 hours. CBG: No results for input(s): "GLUCAP" in the last 168 hours. Iron Studies: No results for input(s): "IRON", "TIBC", "TRANSFERRIN", "FERRITIN" in the last 72 hours. _1 @ Studies/Results: No results found. Medications:  sodium thiosulfate 12.5 g in sodium chloride 0.9 % 150 mL 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 (03/15/22 2305)    sodium chloride   Intravenous Once   aspirin EC  81 mg Oral Daily   atorvastatin  10 mg Oral Daily   Chlorhexidine Gluconate Cloth  6 each Topical Q0600   darbepoetin (ARANESP) injection - DIALYSIS  200 mcg Subcutaneous Q Tue-1800   docusate sodium  100 mg Oral BID   famotidine  20 mg Oral Daily   feeding supplement  1 Container Oral TID BM   gabapentin  100 mg Oral Q1200   gabapentin  200 mg Oral QHS   Gerhardt's butt cream   Topical BID   hydroquinone   Topical BID   lactulose  10 g Oral TID   levothyroxine  100 mcg Oral Q0600   loratadine  10 mg Oral Daily   melatonin  3 mg Oral  QHS   midodrine  10 mg Oral Once per day on Tue Thu Sat   pantoprazole  40 mg Oral Daily   sevelamer carbonate  4.8 g Oral TID WC   sucroferric oxyhydroxide  500 mg Oral TID WC    Assessment/Plan: Inability to obtain HD at OP center D/T body habitus and wound: Difficult situation with no clear answers yet. PT working with pt -  met with seating specialist to get evaluated for custom chair/cushion that will allow her sit for longer periods.  Appreciate efforts from PT team.  ESRD: Usual TTS schedule - was refusing HD at times - has been discussed extensively. New TDC placed 02/04/2022, TDC stopped working again 11/30. IR consulted - S/p venogram, angioplasty of SVC and innominate veins, and TDC exchange. She feels she needs less dialysis- will check URR Painful nodular lesions in abdomen/calciphylaxis: Initially  felt just d/t subq heparin injections, but then worsening. Started empirically on sodium thiosulfate, but caused vomiting so stopped - now getting 1/2 dose - 12.73m q HD. Not on any calcium based products or vitamin D. Needs better control of Phos - have added Velphoro as well. HypoTN/volume: On midodrine 150mqHD, with 1066mrn mid HD w/parameters to keep SBP >100 d/t becoming symptomatic when it drops lower. Significant weight loss this admit. Severe ischial sacral decubitus wound: CT showing chronic osteomyelitis. Limiting her, noted to be slow healing. Goal to tolerate sitting upright - which she is doing with PT.  ID started doxycycline which caused diarrhea - now off.  COPD/Chronic O2 use: Prior O2 at home (5L/min) -> tapered to 2-3L/min here Anemia of ESRD: Hgb down to 7.1 on 12/7 - up to 7.7 - on max dose Aranesp 200m22meekly. Recent Fe load completed. Secondary hyperparathyroidism: Phos high again - ordered for Renvela 4.8g AC TID, refusing/missing at times. Added Velphoro 500mg14m to regimen. CorrCa elevated. Not on VDRA, last PTH 86 (low) on 03/11/22. Avoid all calcium based medications for Vitamin D d/t concern for calciphylaxis.  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 Nutrition - diet continues to be changed from renal.  Currently on regular diet.  Would really prefer she be on a renal diet but patient has been refusing Disposition: Difficult situation. Prior to admit, patient required 6 staff members and 2 hoyer pads for transfer into HD chair. Could not stay on HD for full tx. 2/2 pain of multiple decubitus ulcers and PTAR unable to lift pt into ambulance for transport.SW working on LTC at SNF. Con-wayl need to tolerate 5-6hrs in a chair to tolerate OP dialysis including transport, treatment and cannulation time.  The main issue is her Rt ischial wound. PT has arranged seating specialist with plan for custom power chair/cushion that would keep pressure off wounds and  allow her to sit for longer periods   SamanAnice PaganiniC 03/18/2022, 9:07 AM  [facility] Pager: [phone]

## 2022-03-18 NOTE — Progress Notes (Addendum)
RN called for the 2nd time attempting to bring patient to dialysis. Patient stated " I dont want to come bc I am constipated". RN notified provider on call, Candiss Norse. Will attempt the following day.

## 2022-03-18 NOTE — Progress Notes (Signed)
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    PT Comments    Continuing work on functional mobility and activity tolerance;  Session focused on reassessment of power chair use and tolerance, with good progress compared to her initial session with the demo power chair; Pt tolerated over 2 hours in the power chair yesterday, and has tolerated more than 45 minutes during this PT session, with plans to stay up and OOB in the chair later;   Pt still needed considerable assist for lateral scoot transfers today; Began cueing and working on taking advantage of the head/hips relationship to help with more efficient scooting; will need more practice and reinforcement; To help with getting OOB to the power chair daily, nursing floor staff can use the [facility] lift as well;   Plan for a technician from [place] to come change out the back support of the demo chair for a wider back support for better fit  Recommendations for follow up therapy are one component of a multi-disciplinary discharge planning process, led by the attending physician.  Recommendations may be updated based on patient status, additional functional criteria and insurance authorization.  Follow Up Recommendations  Skilled nursing-short term rehab (<3 hours/day) Can patient physically be transported by private vehicle: No   Assistance Recommended at Discharge Intermittent Supervision/Assistance  Patient can return home with the following A lot of help with walking and/or transfers;A lot of help with  bathing/dressing/bathroom;Assist for transportation;Help with stairs or ramp for entrance   Equipment Recommendations  Wheelchair (measurements PT);Wheelchair cushion (measurements PT);Other (comment);[name] bed (more accomodating mechanical lift, with sling that handles pt's with bil AKAs; Roho cushion (20x24), power wheelchair with power tilt and recline)    Recommendations for Other Services       Precautions / Restrictions Precautions Precautions: Fall Precaution Comments: bil AKA, 2L O2 at baseline; when in poer chair, MUST perform pressure relief every 30 minutes by tilting back for 10 minutes Restrictions Other Position/Activity Restrictions: when in power chair, MUST perform pressure relief every 30 minutes by tilting back for 10 minutes     Mobility  Bed Mobility Overal bed mobility: Modified Independent             General bed mobility comments: pulls on rails to sit upright in bed    Transfers Overall transfer level: Needs assistance   Transfers: Bed to chair/wheelchair/BSC            Lateral/Scoot Transfers: Mod assist, Max assist, +2 physical assistance, +2 safety/equipment (Third person present and helpful) General transfer comment: Used bed pad to minimize shearing; Pt needed Max cues for direction to transfer to the L by scooting; incr time to position bed and power chair optimally, occasionally moving the bed up or down at pt's request, and eventually raising bed to make for a "dwonhill" transfer; short, ineffiient scoots, and eventually used Max assist    Ambulation/Gait                   Secretary/administrator  Wheelchair mobility: Yes Wheelchair propulsion:  (Power) Wheelchair parts: Lawyer: over [employer] Details (indicate cue type and reason): Questioning cues to perform pressure relief; WC specialist present and gave cues/suggestions for positioning, and  driving; Pt's daughter present as well  Modified Rankin (Stroke Patients Only)       Balance     Sitting balance-[name] Scale: Good                                      Cognition Arousal/Alertness: Awake/alert Behavior During Therapy: WFL for tasks assessed/performed Overall Cognitive Status: Within Functional Limits for tasks assessed (for simple tasks)                                 General Comments: Concern for pt remembering to perform pressure relief and following through with pressure relief; Needing reminder cues to scoot transfer to L only        Exercises      General Comments General comments (skin integrity, edema, etc.): WC rep made adjustments to demo chair, and he plans to have a technician come to change out the back support for a wider one      Pertinent Vitals/Pain Pain Assessment Pain Assessment: Faces Faces Pain Scale: Hurts little more Pain Location: abdomen wounds, after approx 45 minutes to anhour in the wc Pain Descriptors / Indicators: Grimacing, Discomfort Pain Intervention(s): Monitored during session, Repositioned (used recline and tilt features)    Home Living                          Prior Function            PT Goals (current goals can now be found in the care plan section) Acute Rehab PT Goals Patient Stated Goal: Get up and use power chair PT Goal Formulation: With patient Time For Goal Achievement: 03/27/22 Potential to Achieve Goals: Good Progress towards PT goals: Progressing toward goals    Frequency    Min 2X/week      PT Plan Current plan remains appropriate    Co-evaluation              AM-PAC PT "6 Clicks" Mobility   Outcome Measure  Help needed turning from your back to your side while in a flat bed without using bedrails?: None Help needed moving from lying on your back to sitting on the side of a flat bed without using bedrails?: None Help needed moving to and  from a bed to a chair (including a wheelchair)?: Total Help needed standing up from a chair using your arms (e.g., wheelchair or bedside chair)?: Total Help needed to walk in [name] room?: Total Help needed climbing 3-5 steps with a railing? : Total 6 Click Score: 12    End of Session   Activity Tolerance: Patient tolerated treatment well (Chose to proceed with Courtney [name] practice despite pain) Patient left: Other (comment) (In power chair, going back to her room with daughter and WC rep) Nurse Communication: Mobility status;Need for lift equipment PT Visit Diagnosis: Other abnormalities of gait and mobility (R26.89);Muscle weakness (generalized) (M62.81) Pain - Right/Left: Right Pain - part of body: Hip;Leg (ischial tuberosity; R abdominal nodular pain)     Time: 7616-0737 PT Time Calculation (min) (ACUTE ONLY): 69 min  Charges:  $Therapeutic Activity: 23-37  mins $Self Care/Home Management: 8-22 $Wheel Chair Management: 23-37 mins                     Roney [place], [place]  Acute Rehabilitation Services Office [phone]    Colletta [place] 03/18/2022, 5:45 PM

## 2022-03-19 DIAGNOSIS — R7881 Bacteremia: Secondary | ICD-10-CM | POA: Diagnosis not present

## 2022-03-19 DIAGNOSIS — N186 End stage renal disease: Secondary | ICD-10-CM | POA: Diagnosis not present

## 2022-03-19 DIAGNOSIS — I5023 Acute on chronic systolic (congestive) heart failure: Secondary | ICD-10-CM | POA: Diagnosis not present

## 2022-03-19 DIAGNOSIS — E1169 Type 2 diabetes mellitus with other specified complication: Secondary | ICD-10-CM | POA: Diagnosis not present

## 2022-03-19 NOTE — Progress Notes (Signed)
[facility] Progress Note   Subjective:   Refused HD yesterday due to constipation, says she will ask for suppository this AM and come to HD tonight. No SOB, CP or dizziness.   Objective Vitals:   03/18/22 2104 03/19/22 0500 03/19/22 0530 03/19/22 0830  BP: 137/67  136/60 131/81  Pulse: 76  64 67  Resp: _0 Temp: 98.4 F (36.9 C)  98.1 F (36.7 C) 98.3 F (36.8 C)  TempSrc: Oral  Oral Oral  SpO2: 100%  100% 100%  Weight:  115.7 kg    Height:       Physical Exam General: Chronically ill appearing female, alert and in NAD Heart: RRR, no murmurs, rubs or gallops Lungs: CTA bilaterally without wheezing, rhonchi or rales Abdomen:Soft, non-distended Extremities: No edema b/l AKAs Dialysis Access: [facility]  Additional Objective Labs: Basic Metabolic Panel: Recent Labs  Lab 03/14/22 1103  NA 138  K 4.0  CL 99  CO2 27  GLUCOSE 110*  BUN 9  CREATININE 4.08*  CALCIUM 9.0   Liver Function Tests: No results for input(s): "AST", "ALT", "ALKPHOS", "BILITOT", "PROT", "ALBUMIN" in the last 168 hours. No results for input(s): "LIPASE", "AMYLASE" in the last 168 hours. CBC: Recent Labs  Lab 03/13/22 2300 03/14/22 1103 03/15/22 0918  HGB 7.2* 8.0* 7.7*  HCT 24.7* 26.9* 25.9*   Blood Culture    Component Value Date/Time   SDES BLOOD LEFT ANTECUBITAL 10/16/2021 1534   SPECREQUEST  10/16/2021 1534    BOTTLES DRAWN AEROBIC AND ANAEROBIC Blood Culture results may not be optimal due to an inadequate volume of blood received in culture bottles   CULT  10/16/2021 1534    NO GROWTH 5 DAYS Performed at [facility], [place] [address] [place], [place] [address]    REPTSTATUS 10/21/2021 FINAL 10/16/2021 1534    Cardiac Enzymes: No results for input(s): "CKTOTAL", "CKMB", "CKMBINDEX", "TROPONINI" in the last 168 hours. CBG: No results for input(s): "GLUCAP" in the last 168 hours. Iron Studies: No results for input(s): "IRON", "TIBC", "TRANSFERRIN",  "FERRITIN" in the last 72 hours. _1 @ Studies/Results: No results found. Medications:  sodium thiosulfate 12.5 g in sodium chloride 0.9 % 150 mL 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 (03/15/22 2305)    aspirin EC  81 mg Oral Daily   atorvastatin  10 mg Oral Daily   Chlorhexidine Gluconate Cloth  6 each Topical Q0600   darbepoetin (ARANESP) injection - DIALYSIS  200 mcg Subcutaneous Q Tue-1800   docusate sodium  100 mg Oral BID   famotidine  20 mg Oral Daily   feeding supplement  1 Container Oral TID BM   gabapentin  100 mg Oral Q1200   gabapentin  200 mg Oral QHS   Gerhardt's butt cream   Topical BID   hydroquinone   Topical BID   lactulose  10 g Oral TID   levothyroxine  100 mcg Oral Q0600   loratadine  10 mg Oral Daily   melatonin  3 mg Oral QHS   midodrine  10 mg Oral Once per day on Tue Thu Sat   oxyCODONE  10 mg Oral Q12H   pantoprazole  40 mg Oral Daily   sevelamer carbonate  4.8 g Oral TID WC   sucroferric oxyhydroxide  500 mg Oral TID WC    Assessment/Plan: Inability to obtain HD at OP center D/T body habitus and wound: Difficult situation with no clear answers yet. PT working with pt -  met with  seating specialist to get evaluated for custom chair/cushion that will allow her sit for longer periods.  Appreciate efforts from PT team.  ESRD: Usual TTS schedule - was refusing HD at times including yesterday- has been discussed extensively. New TDC placed 02/04/2022, TDC stopped working again 11/30. IR consulted - S/p venogram, angioplasty of SVC and innominate veins, and TDC exchange. 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 Started empirically on sodium thiosulfate, but caused vomiting so stopped - now getting 1/2 dose - 12.65m q HD, refuses increased dose. Not on any calcium based products or vitamin D. Needs better control of Phos - have added  Velphoro as well. HypoTN/volume: On midodrine 168mqHD, with 1065mrn mid HD w/parameters to keep SBP >100 d/t becoming symptomatic when it drops lower. Significant weight loss this admit. Severe ischial sacral decubitus wound: CT showing chronic osteomyelitis. Limiting her, noted to be slow healing. Goal to tolerate sitting upright - which she is doing with PT.  ID started doxycycline which caused diarrhea - now off.  COPD/Chronic O2 use: Prior O2 at home (5L/min) -> tapered to 2-3L/min here Anemia of ESRD: Hgb down to 7.1 on 12/7 - up to 7.7 - on max dose Aranesp 200m73meekly. Recent Fe load completed. Secondary hyperparathyroidism: Phos high again - ordered for Renvela 4.8g AC TID, refusing/missing at times. Added Velphoro 500mg52m to regimen. CorrCa elevated. Not on VDRA, last PTH 86 (low) on 03/11/22. Avoid all calcium based medications for Vitamin D d/t concern for calciphylaxis.  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 Nutrition - diet continues to be changed from renal.  Currently on regular diet.  Would really prefer she be on a renal diet but patient has been refusing Disposition: Difficult situation. Prior to admit, patient required 6 staff members and 2 hoyer pads for transfer into HD chair. Could not stay on HD for full tx. 2/2 pain of multiple decubitus ulcers and PTAR unable to lift pt into ambulance for transport.SW working on LTC at SNF. Con-wayl need to tolerate 5-6hrs in a chair to tolerate OP dialysis including transport, treatment and cannulation time.  The main issue is her Rt ischial wound. PT has arranged seating specialist with plan for custom power chair/cushion that would keep pressure off wounds and allow her to sit for longer periods   SamanAnice PaganiniC 03/19/2022, 9:51 AM  [facility] Pager: [phone]

## 2022-03-19 NOTE — Progress Notes (Signed)
Progress Note   Patient: Courtney Gray [id number]:[id number] DOB: Oct 16, 1970 DOA: 11/01/2021     138 DOS: the patient was seen and examined on 03/19/2022   Brief hospital course: 51 year old F ESRD on HD TTS,  recent lengthy hospitalization for Bacteroides Thetaiotaomicron bacteremia, hepatic and splenic abscess --required amputation of third digit of left hand and underwent left AKA and had splenic drain as well as CT-guided drainage of liver abscess she had a line holiday on last admission on long-term IV antibiotics until 08/12/8467 diastolic CHF, PVD s/p bilateral AKA, IDDM-2, morbid obesity and  chronic sacral wound presenting with progressive shortness of breath in the setting of missed dialysis and admitted for hypertensive emergency  Per patient, she never missed her dialysis however according to outpatient HD records, it takes at least 4 staff members and [facility] lift for patient to be seated in the dialysis chair.    Symptoms improved with hemodialysis.  Patient underwent R UE venogram on 8/30.  Vascular surgery following for possible RUE HD access.    Patient is now waiting placement where she can get HD on stretcher that has been difficult to find.  Assessment and Plan: End stage renal disease [facility]) Patient will continue renal replacement therapy per nephrology recommendations.  Needs placement to continue with HD as outpatient.  Very deconditioned, large sacral decubitus wound.   Continue renal replacement therapy per nephrology recommendations.   Anemia of chronic kidney disease, Hgb has been stable.  Continue with EPO and Iron supplementation.   Metabolic bone disease, continue with sevelamer.   Hyperkalemia, hypomagnesemia, hypokalemia, hyponatremia, hypophosphatemia have been corrected with renal replacement therapy.   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 Empiric treatment with thiosulfate 11/18. Discontinued due to persistent nausea and vomiting.  Patient complains of pain at  the subcutaneous nodules  Will try to limit opioid analgesics, will give one dose of fentanyl and continue pain control with oxycodone every 6 hrs as needed.   Hypertension Patient on midodrine on HD to prevent hypotension. Currently off antihypertensive medications.   Decubitus ulcer of sacral region, stage 3 (HCC) Deep wound at the sacrum, patient not able to seat.   Continue with oral oxycodone. Continue with local wound care.    Type 2 diabetes mellitus with hyperlipidemia (HCC) Her glucose has been stable and insulin therapy has been discontinued.   Statin therapy with atorvastatin.   Bacteremia Patient has completed antibiotic therapy. Bacteroides, splenic abscess.   PVD (peripheral vascular disease) (HCC) Bilateral AKA.   Continue local wound care.   Now patient has an electric chair   Class 3 obesity (HCC) Calculated BMI is 42.6  Acute on chronic diastolic CHF (congestive heart failure) (HCC) Volume regulation with ultrafiltration in hemodialysis Continue midodrine for blood pressure support.   Nodule of skin of abdomen Noted to have areas of painful nodularity in her right flank as well as anterior left lower pannus.  These areas are mildly warm, but tender to palpation.  Tenderness has been present for 2 weeks, but she feels it is getting worse.  She has been afebrile and wbc count normal. CT abdomen did not show any underlying skin abscess. Discussed with Nephrology, Dr. 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 Recommended Gen surgery see for biopsy. I reached out to Saverio Danker, [place] with Gen Surgery, no biopsy recommended  Continue supportive medical care with oral analgesics and control of electrolytes through hemodialysis.         Subjective: patient continue to have lower abdomen with no nausea  or vomiting.   Physical Exam: Vitals:   03/19/22 0500 03/19/22 0530 03/19/22 0830 03/19/22 1647  BP:  136/60 131/81 (!) 143/81  Pulse:   64 67 70  Resp:  18 18 17   Temp:  98.1 F (36.7 C) 98.3 F (36.8 C) (!) 97.4 F (36.3 C)  TempSrc:  Oral Oral Oral  SpO2:  100% 100% 100%  Weight: 115.7 kg     Height:       Neurology awake and alert ENT no pallor Cardiovascular S1 and S2 present and rhythmic with no gallops Respiratory with no rales or wheezing Abdomen with no distention   Data Reviewed:    Family Communication: no family at the bedside   Disposition: Status is: Inpatient Remains inpatient appropriate because: pending placement   Planned Discharge Destination: Home      Author: Tawni Millers, MD 03/19/2022 5:11 PM  For on call review [email or web address].

## 2022-03-19 NOTE — Progress Notes (Addendum)
Spoke with Maryan Puls, RN 67M, stated that pt wants to come to dialysis tonight instead of now. PA Anice Paganini notified

## 2022-03-20 DIAGNOSIS — I5023 Acute on chronic systolic (congestive) heart failure: Secondary | ICD-10-CM | POA: Diagnosis not present

## 2022-03-20 DIAGNOSIS — J9611 Chronic respiratory failure with hypoxia: Secondary | ICD-10-CM | POA: Diagnosis not present

## 2022-03-20 DIAGNOSIS — N186 End stage renal disease: Secondary | ICD-10-CM | POA: Diagnosis not present

## 2022-03-20 DIAGNOSIS — I10 Essential (primary) hypertension: Secondary | ICD-10-CM | POA: Diagnosis not present

## 2022-03-20 LAB — CBC
HCT: 23.1 % — ABNORMAL LOW (ref 36.0–46.0)
Hemoglobin: 7 g/dL — ABNORMAL LOW (ref 12.0–15.0)
MCH: 29 pg (ref 26.0–34.0)
MCHC: 30.3 g/dL (ref 30.0–36.0)
MCV: 95.9 fL (ref 80.0–100.0)
Platelets: 244 10*3/uL (ref 150–400)
RBC: 2.41 MIL/uL — ABNORMAL LOW (ref 3.87–5.11)
RDW: 15.8 % — ABNORMAL HIGH (ref 11.5–15.5)
WBC: 10.9 10*3/uL — ABNORMAL HIGH (ref 4.0–10.5)
nRBC: 0 % (ref 0.0–0.2)

## 2022-03-20 LAB — BASIC METABOLIC PANEL
Anion gap: 14 (ref 5–15)
BUN: 43 mg/dL — ABNORMAL HIGH (ref 6–20)
CO2: 25 mmol/L (ref 22–32)
Calcium: 9 mg/dL (ref 8.9–10.3)
Chloride: 102 mmol/L (ref 98–111)
Creatinine, Ser: 10.46 mg/dL — ABNORMAL HIGH (ref 0.44–1.00)
GFR, Estimated: 4 mL/min — ABNORMAL LOW (ref >60)
Glucose, Bld: 97 mg/dL (ref 70–99)
Potassium: 4.9 mmol/L (ref 3.5–5.1)
Sodium: 141 mmol/L (ref 135–145)

## 2022-03-20 LAB — HEPATITIS B SURFACE ANTIBODY,QUALITATIVE: Hep B S Ab: NONREACTIVE

## 2022-03-20 MED ORDER — HEPARIN SODIUM (PORCINE) 1000 UNIT/ML DIALYSIS: 4000.0000 [IU] | Freq: Once | INTRAMUSCULAR | Status: DC

## 2022-03-20 MED ORDER — HEPARIN SODIUM (PORCINE) 1000 UNIT/ML IJ SOLN
INTRAMUSCULAR | Status: AC
  Filled 2022-03-20: qty 5

## 2022-03-20 NOTE — Progress Notes (Signed)
   03/20/22 1120  Clinical Encounter Type  Visited With Patient  Visit Type Initial  Referral From Nurse  Consult/Referral To Chaplain   Chaplain responded to a spiritual consult to discuss a major life transition.  The patient welcomed me. She began to share her story since I had last seen her. We had two interruptions for care, one was for lunch which is better hot.  I offered to return on Saturday which may be a bit quieter. I will continue to monitor.   Danice Goltz [facility]  [phone]

## 2022-03-20 NOTE — Progress Notes (Signed)
Progress Note   Patient: Courtney Gray QQV:[id number] DOB: 04-23-1970 DOA: 11/01/2021     139 DOS: the patient was seen and examined on 03/20/2022   Brief hospital course: 51 year old F ESRD on HD TTS,  recent lengthy hospitalization for Bacteroides Thetaiotaomicron bacteremia, hepatic and splenic abscess --required amputation of third digit of left hand and underwent left AKA and had splenic drain as well as CT-guided drainage of liver abscess she had a line holiday on last admission on long-term IV antibiotics until 06/07/2949 diastolic CHF, PVD s/p bilateral AKA, IDDM-2, morbid obesity and  chronic sacral wound presenting with progressive shortness of breath in the setting of missed dialysis and admitted for hypertensive emergency  Per patient, she never missed her dialysis however according to outpatient HD records, it takes at least 4 staff members and [facility] lift for patient to be seated in the dialysis chair.    Symptoms improved with hemodialysis.  Patient underwent R UE venogram on 8/30.  Vascular surgery following for possible RUE HD access.    Patient is now waiting placement where she can get HD on stretcher that has been difficult to find.  Assessment and Plan: End stage renal disease [facility]) Patient will continue renal replacement therapy per nephrology recommendations.  Needs placement to continue with HD as outpatient.  Very deconditioned, large sacral decubitus wound.   Continue renal replacement therapy per nephrology recommendations.   Anemia of chronic kidney disease, Hgb has been stable.  Continue with EPO and Iron supplementation.   Metabolic bone disease, continue with sevelamer.   Hyperkalemia, hypomagnesemia, hypokalemia, hyponatremia, hypophosphatemia have been corrected with renal replacement therapy.   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 Empiric treatment with thiosulfate 11/18. Discontinued due to persistent nausea and vomiting.  Patient complains of pain at  the subcutaneous nodules  Will try to limit opioid analgesics, will give one dose of fentanyl and continue pain control with oxycodone every 6 hrs as needed.   Hypertension Patient on midodrine on HD to prevent hypotension. Currently off antihypertensive medications.   Decubitus ulcer of sacral region, stage 3 (HCC) Deep wound at the sacrum, patient not able to seat.   Continue with oral oxycodone. Continue with local wound care.    Type 2 diabetes mellitus with hyperlipidemia (HCC) Her glucose has been stable and insulin therapy has been discontinued.   Statin therapy with atorvastatin.   Bacteremia Patient has completed antibiotic therapy. Bacteroides, splenic abscess.   PVD (peripheral vascular disease) (HCC) Bilateral AKA.   Continue local wound care.   Now patient has an electric chair   Class 3 obesity (HCC) Calculated BMI is 42.6  Acute on chronic diastolic CHF (congestive heart failure) (HCC) Volume regulation with ultrafiltration in hemodialysis Continue midodrine for blood pressure support.   Nodule of skin of abdomen Noted to have areas of painful nodularity in her right flank as well as anterior left lower pannus.  These areas are mildly warm, but tender to palpation.  Tenderness has been present for 2 weeks, but she feels it is getting worse.  She has been afebrile and wbc count normal. CT abdomen did not show any underlying skin abscess. Discussed with Nephrology, Dr. 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 Recommended Gen surgery see for biopsy. I reached out to Saverio Danker, [place] with Gen Surgery, no biopsy recommended  Continue supportive medical care with oral analgesics and control of electrolytes through hemodialysis.         Subjective: patient with no chest pain or dyspnea, had HD  yesterday with good toleration. Still not able to seat for HD but today will try motorized scooter   Physical Exam: Vitals:   03/20/22 0358  03/20/22 0414 03/20/22 0439 03/20/22 1009  BP: 94/80 (!) 147/81 (!) 188/64 (!) 117/94  Pulse: (!) 59 69 77 74  Resp: 18 16 16 17   Temp:  98.5 F (36.9 C) (!) 97.5 F (36.4 C) 98.9 F (37.2 C)  TempSrc:  Oral Oral Oral  SpO2: 100% 100% 100% 100%  Weight:      Height:       Neurology awake and alert ENT with mild pallor Cardiovascular with S1 and S2 present and rhythmic Respiratory with no rales or wheezing Abdomen with no distention  Bilateral BKA  Data Reviewed:    Family Communication: no family at the bedside   Disposition: Status is: Inpatient Remains inpatient appropriate because: pending placement, patient is medically stable   Planned Discharge Destination: Skilled nursing facility    Author: Tawni Millers, MD 03/20/2022 12:21 PM  For on call review [email or web address].

## 2022-03-20 NOTE — Progress Notes (Addendum)
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    PT Comments    Good session today. Patient happy to participate in transfer training to [facility]. Tried A/P transfer, which only required min assist for bed pad scoot to reduce frictional forces (+2 for safety.) Posterior scoot went very well, anterior pt required more verbal cues to sequence but overall very encouraged. Focused further on navigating power-chair and using various settings to manipulate positioning. Rep scheduled to come tomorrow with new demo chair for Pt to try. Total time in chair today 30 min (due to request for transfer training back into new bed.) Continue to encourage pt time OOB in power chair for increased tolerance, transfer with staff for repetition. Patient will continue to benefit from skilled physical therapy services to further improve independence with functional mobility.   Recommendations for follow up therapy are one component of a multi-disciplinary discharge planning process, led by the attending physician.  Recommendations may be updated based on patient status, additional functional criteria and insurance authorization.  Follow Up Recommendations  Skilled nursing-short term rehab (<3 hours/day) Can patient physically be transported by private vehicle: No   Assistance Recommended at Discharge Intermittent Supervision/Assistance  Patient can return home with the following A lot of help with walking and/or transfers;A lot of help with bathing/dressing/bathroom;Assist for  transportation;Help with stairs or ramp for entrance   Equipment Recommendations  Wheelchair (measurements PT);Wheelchair cushion (measurements PT);Other (comment);Hospital bed (more accomodating mechanical lift, with sling that handles pt's with bil AKAs; Roho cushion (20x24), power wheelchair with power tilt and recline)    Recommendations for Other Services       Precautions / Restrictions Precautions Precautions: Fall Precaution Comments: bil AKA, 2L O2 at baseline; when in power chair, MUST perform pressure relief every 30 minutes by tilting back for 10 minutes Restrictions Other Position/Activity Restrictions: when in power chair, MUST perform pressure relief every 30 minutes by tilting back for 10 minutes     Mobility  Bed Mobility Overal bed mobility: Modified Independent             General bed mobility comments: pulls on rails to sit upright in bed    Transfers Overall transfer level: Needs assistance Equipment used: None Transfers: Bed to chair/wheelchair/BSC         Anterior-Posterior transfers: Min assist, +2 safety/equipment, From elevated surface   General transfer comment: Pt nearly in A/P transfer position when PT entered room. Educated on this technique. She very much liked the posterior transfer, required min assist just to glide bed pad backwards into w/c to reduce sheering forces. A little more effort with anterior transfer back to bed (chair also had to be safely aligned for patient prior.) Again with min assist to scoot bed pad forward however pt did a great job shifting weight and pushing through UEs to transition back onto bed. (chair elevates to allow for LE clearance back into bed during forward scoot).    Ambulation/Gait                   Stairs  Information systems manager mobility: Yes Wheelchair propulsion:  (Power) Wheelchair parts: Needs assistance Distance: >300 Wheelchair Assistance  Details (indicate cue type and reason): Further education for pressure relief, settings, and manipulation of w/c for lining up for transfers. She did require some assist for congested areas. Intermittent VC for scanning environment and adjusted distance from walls/furniture to leave margin for error. Practiced 360 turns, 90 deg turns around objects, and backing up in hallway for approx 15 feet (slight min assist.)  Modified Rankin (Stroke Patients Only)       Balance                                            Cognition Arousal/Alertness: Awake/alert Behavior During Therapy: WFL for tasks assessed/performed Overall Cognitive Status: Within Functional Limits for tasks assessed                                          Exercises      General Comments        Pertinent Vitals/Pain Pain Assessment Pain Assessment: Faces Faces Pain Scale: Hurts little more Pain Location: abdomen wounds, after approx 45 minutes to anhour in the wc Pain Descriptors / Indicators: Grimacing, Discomfort Pain Intervention(s): Monitored during session, Repositioned    Home Living                          Prior Function            PT Goals (current goals can now be found in the care plan section) Acute Rehab PT Goals Patient Stated Goal: Get up and use power chair PT Goal Formulation: With patient Time For Goal Achievement: 03/27/22 Potential to Achieve Goals: Good Progress towards PT goals: Progressing toward goals    Frequency    Min 2X/week      PT Plan Current plan remains appropriate    Co-evaluation              AM-PAC PT "6 Clicks" Mobility   Outcome Measure  Help needed turning from your back to your side while in a flat bed without using bedrails?: None Help needed moving from lying on your back to sitting on the side of a flat bed without using bedrails?: None Help needed moving to and from a bed to a chair (including a  wheelchair)?: Total Help needed standing up from a chair using your arms (e.g., wheelchair or bedside chair)?: Total Help needed to walk in hospital room?: Total Help needed climbing 3-5 steps with a railing? : Total 6 Click Score: 12    End of Session Equipment Utilized During Treatment: Oxygen Activity Tolerance: Patient tolerated treatment well Patient left: in bed;with call bell/phone within reach (Declines sitting in chair because she wanted to work on transfers with PT.) Nurse Communication: Mobility status;Need for lift equipment PT Visit Diagnosis: Other abnormalities of gait and mobility (R26.89);Muscle weakness (generalized) (M62.81) Pain - Right/Left: Right Pain - part of body: Hip;Leg (ischial tuberosity; R abdominal nodular pain)     Time: 1402-1500 PT Time Calculation (min) (ACUTE ONLY): 58 min  Charges:  $Therapeutic Activity: 23-37 mins $Self Care/Home Management: 8-22 $Wheel Chair Management: 8-22 mins  Candie Mile, PT, DPT Physical Therapist Acute Rehabilitation Services [facility] 03/20/2022, 4:23 PM

## 2022-03-20 NOTE — Progress Notes (Signed)
Received patient in bed to unit.  Alert and oriented.  Informed consent signed and in chart.   Treatment initiated: 0002 Treatment completed: 0414  Patient tolerated well.  Transported back to the room  Alert, without acute distress.  Hand-off given to patient's nurse.   Access used: catheter Access issues: none  Total UF removed: 700 ml Medication(s) given: sodium triosulfate Post HD VS: 98.5 oral 147/81 71 16 100 2L [place]  Post HD weight: unable to obtain.   [facility]

## 2022-03-20 NOTE — Plan of Care (Signed)
  Problem: Health Behavior/Discharge Planning: Goal: Ability to manage health-related needs will improve Outcome: Progressing   

## 2022-03-20 NOTE — Progress Notes (Signed)
[facility] Progress Note   Subjective:   Had HD overnight which went well. Upset because she cannot find her phone. Denies SOB, CP and dizziness.   Objective Vitals:   03/20/22 0358 03/20/22 0414 03/20/22 0439 03/20/22 1009  BP: 94/80 (!) 147/81 (!) 188/64 (!) 117/94  Pulse: (!) 59 69 77 74  Resp: _0 Temp:  98.5 F (36.9 C) (!) 97.5 F (36.4 C) 98.9 F (37.2 C)  TempSrc:  Oral Oral Oral  SpO2: 100% 100% 100% 100%  Weight:      Height:       Physical Exam General: Chronically ill appearing female, alert and in NAD Heart: RRR, no murmurs, rubs or gallops Lungs: CTA bilaterally without wheezing, rhonchi or rales Abdomen:Soft, non-distended Extremities: No edema b/l AKAs Dialysis Access: [facility]  Additional Objective Labs: Basic Metabolic Panel: Recent Labs  Lab 03/14/22 1103 03/20/22 0010  NA 138 141  K 4.0 4.9  CL 99 102  CO2 27 25  GLUCOSE 110* 97  BUN 9 43*  CREATININE 4.08* 10.46*  CALCIUM 9.0 9.0   Liver Function Tests: No results for input(s): "AST", "ALT", "ALKPHOS", "BILITOT", "PROT", "ALBUMIN" in the last 168 hours. No results for input(s): "LIPASE", "AMYLASE" in the last 168 hours. CBC: Recent Labs  Lab 03/14/22 1103 03/15/22 0918 03/20/22 0010  WBC  --   --  10.9*  HGB 8.0* 7.7* 7.0*  HCT 26.9* 25.9* 23.1*  MCV  --   --  95.9  PLT  --   --  244   Blood Culture    Component Value Date/Time   SDES BLOOD LEFT ANTECUBITAL 10/16/2021 1534   SPECREQUEST  10/16/2021 1534    BOTTLES DRAWN AEROBIC AND ANAEROBIC Blood Culture results may not be optimal due to an inadequate volume of blood received in culture bottles   CULT  10/16/2021 1534    NO GROWTH 5 DAYS Performed at [facility], [place] [address], [place], [place] [address]    REPTSTATUS 10/21/2021 FINAL 10/16/2021 1534    Cardiac Enzymes: No results for input(s): "CKTOTAL", "CKMB", "CKMBINDEX", "TROPONINI" in the last 168 hours. CBG: No results for input(s):  "GLUCAP" in the last 168 hours. Iron Studies: No results for input(s): "IRON", "TIBC", "TRANSFERRIN", "FERRITIN" in the last 72 hours. _1 @ Studies/Results: No results found. Medications:  sodium thiosulfate 12.5 g in sodium chloride 0.9 % 150 mL 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 (03/20/22 0315)    aspirin EC  81 mg Oral Daily   atorvastatin  10 mg Oral Daily   Chlorhexidine Gluconate Cloth  6 each Topical Q0600   darbepoetin (ARANESP) injection - DIALYSIS  200 mcg Subcutaneous Q Tue-1800   docusate sodium  100 mg Oral BID   famotidine  20 mg Oral Daily   feeding supplement  1 Container Oral TID BM   gabapentin  100 mg Oral Q1200   gabapentin  200 mg Oral QHS   Gerhardt's butt cream   Topical BID   heparin  4,000 Units Dialysis Once in dialysis   heparin sodium (porcine)       hydroquinone   Topical BID   lactulose  10 g Oral TID   levothyroxine  100 mcg Oral Q0600   loratadine  10 mg Oral Daily   melatonin  3 mg Oral QHS   midodrine  10 mg Oral Once per day on Tue Thu Sat   oxyCODONE  10 mg Oral Q12H   pantoprazole  40  mg Oral Daily   sevelamer carbonate  4.8 g Oral TID WC   sucroferric oxyhydroxide  500 mg Oral TID WC    Assessment/Plan: Inability to obtain HD at OP center D/T body habitus and wound: Difficult situation with no clear answers yet. PT working with pt -  met with seating specialist to get evaluated for custom chair/cushion that will allow her sit for longer periods.  Appreciate efforts from PT team.  ESRD: Usual TTS schedule - was refusing HD at times including yesterday- has been discussed extensively. Is off schedule this week, will plan for HD tomorrow then resume regular schedule. New TDC placed 02/04/2022, TDC stopped working again 11/30. IR consulted - S/p venogram, angioplasty of SVC and innominate veins, and TDC exchange. 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 Started empirically on sodium thiosulfate, but caused vomiting so stopped - now getting 1/2 dose - 12.29m q HD, refuses increased dose. Not on any calcium based products or vitamin D. Needs better control of Phos - have added Velphoro as well. HypoTN/volume: On midodrine 146mqHD, with 1021mrn mid HD w/parameters to keep SBP >100 d/t becoming symptomatic when it drops lower. Significant weight loss this admit. Severe ischial sacral decubitus wound: CT showing chronic osteomyelitis. Limiting her, noted to be slow healing. Goal to tolerate sitting upright - which she is doing with PT.  ID started doxycycline which caused diarrhea - now off.  COPD/Chronic O2 use: Prior O2 at home (5L/min) -> tapered to 2-3L/min here Anemia of ESRD: Hgb down to 7.0- on max dose Aranesp 200m6meekly. Recent Fe load completed. Secondary hyperparathyroidism: Phos high again - ordered for Renvela 4.8g AC TID, refusing/missing at times. Added Velphoro 500mg38m to regimen. CorrCa elevated. Not on VDRA, last PTH 86 (low) on 03/11/22. Avoid all calcium based medications for Vitamin D d/t concern for calciphylaxis.  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 Nutrition - diet continues to be changed from renal.  Currently on regular diet.  Would really prefer she be on a renal diet but patient has been refusing Disposition: Difficult situation. Prior to admit, patient required 6 staff members and 2 hoyer pads for transfer into HD chair. Could not stay on HD for full tx. 2/2 pain of multiple decubitus ulcers and PTAR unable to lift pt into ambulance for transport.SW working on LTC at SNF. Con-wayl need to tolerate 5-6hrs in a chair to tolerate OP dialysis including transport, treatment and cannulation time.  The main issue is her Rt ischial wound. PT has arranged seating specialist with plan for custom power chair/cushion that would keep pressure off wounds and allow her to sit  for longer periods   SamanAnice PaganiniC 03/20/2022, 2:57 PM  [facility] Pager: [phone]

## 2022-03-21 DIAGNOSIS — I5023 Acute on chronic systolic (congestive) heart failure: Secondary | ICD-10-CM | POA: Diagnosis not present

## 2022-03-21 DIAGNOSIS — I10 Essential (primary) hypertension: Secondary | ICD-10-CM | POA: Diagnosis not present

## 2022-03-21 DIAGNOSIS — N186 End stage renal disease: Secondary | ICD-10-CM | POA: Diagnosis not present

## 2022-03-21 DIAGNOSIS — E1169 Type 2 diabetes mellitus with other specified complication: Secondary | ICD-10-CM | POA: Diagnosis not present

## 2022-03-21 LAB — HEPATITIS B SURFACE ANTIGEN: Hepatitis B Surface Ag: NONREACTIVE

## 2022-03-21 MED ORDER — LACTULOSE 10 GM/15ML PO SOLN
20.0000 g | Freq: Two times a day (BID) | ORAL | Status: DC | PRN
  Administered 2022-03-25 – 2022-03-30 (×4): 20 g via ORAL
  Filled 2022-03-21 (×4): qty 30

## 2022-03-21 MED ORDER — LACTULOSE 10 GM/15ML PO SOLN
20.0000 g | Freq: Once | ORAL | Status: AC
  Administered 2022-03-21: 20 g via ORAL
  Filled 2022-03-21: qty 30

## 2022-03-21 NOTE — Plan of Care (Signed)
  Problem: Clinical Measurements: Goal: Cardiovascular complication will be avoided Outcome: Progressing   Problem: Nutrition: Goal: Adequate nutrition will be maintained Outcome: Progressing   Problem: Coping: Goal: Level of anxiety will decrease Outcome: Progressing   Problem: Clinical Measurements: Goal: Respiratory complications will improve Outcome: Not Progressing   Problem: Activity: Goal: Risk for activity intolerance will decrease Outcome: Not Progressing

## 2022-03-21 NOTE — Progress Notes (Addendum)
Subjective: Seen in room no complaints.  Noted last dialysis yesterday early early a.m. at 4 AM on schedule TTS  Objective Vital signs in last 24 hours: Vitals:   03/20/22 1729 03/20/22 2051 03/21/22 0518 03/21/22 0911  BP: 114/82 (!) 158/46 (!) 153/48 139/72  Pulse: 71 75 71 76  Resp: _0 Temp: 98.6 F (37 C) 98.3 F (36.8 C) 98.5 F (36.9 C) 98.1 F (36.7 C)  TempSrc:  Oral Oral Oral  SpO2: 100% 100% 100% 96%  Weight:      Height:       Weight change:   Physical Exam: General: Chronically ill adult female NABS Heart: RRR no MRG Lungs: Slightly decreased breath sounds secondary to body habitus but CTA, nonlabored breathing on 3 L nasal cannula oxygen Abdomen: Obese, NABS, soft, nondistended, nontender except at left lower quadrant areas of suspected calciphylaxis bandage dry clean Extremities: Lateral AKA's trace edema Dialysis Access: TDC clean dry dressing  Problem/Plan: Inability to obtain HD at OP center D/T body habitus and wound: Difficult situation with no clear answers yet. PT working with pt -  met with seating specialist to get evaluated for custom chair/cushion that will allow her sit for longer periods.  Appreciate efforts from PT team.  ESRD: Usual TTS schedule - was refusing HD at times, last HD Thursday a.m. 4 AM ,next HD tomorrow on schedule ,creatinine HD 10.5 yesterday HD access =new TDC placed 02/04/2022, TDC stopped working again 11/30. IR consulted - S/p venogram, angioplasty of SVC and innominate veins, and TDC exchange.  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 Started empirically on sodium thiosulfate, but caused vomiting so stopped - now getting 1/2 dose - 12.82m q HD, refuses increased dose. Not on any calcium based products or vitamin D. Needs better control of Phos - have added Velphoro as well. HypoTN/volume: On midodrine 144mqHD, with 1081mrn mid HD  w/parameters to keep SBP >100 d/t becoming symptomatic when it drops lower. Significant weight loss this admit. Severe ischial sacral decubitus wound: CT showing chronic osteomyelitis. Limiting her, noted to be slow healing. Goal to tolerate sitting upright - which she is doing with PT.  ID started doxycycline which caused diarrhea - now off.  COPD/Chronic O2 use: Prior O2 at home (5L/min) -> tapered to 2-3L/min here Anemia of ESRD: Hgb down to 7.0- on max dose Aranesp 200m8meekly. Recent Fe load completed. Secondary hyperparathyroidism: Phos 5.6 has been higher- ordered for Renvela 4.8g AC TID, refusing/missing at times. Added Velphoro 500mg9m to regimen. CorrCa elevated. Not on VDRA, last PTH 86 (low) on 03/11/22. Avoid all calcium based medications for Vitamin D d/t concern for calciphylaxis.  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 Nutrition - diet continues to be changed from renal.  Currently on regular diet.  Would really prefer she be on a renal diet but patient has been refusing Disposition: Difficult situation. Prior to admit, patient required 6 staff members and 2 hoyer pads for transfer into HD chair. Could not stay on HD for full tx. 2/2 pain of multiple decubitus ulcers and PTAR unable to lift pt into ambulance for transport.SW working on LTC at SNF. Con-wayl need to tolerate 5-6hrs in a chair to tolerate OP dialysis including transport, treatment and cannulation time.  The main issue is her Rt ischial wound. PT has arranged seating specialist with plan for custom power chair/cushion that  would keep pressure off wounds and allow her to sit for longer periods   **This patient will not be seen over this weekend (12 16.03/23/22 ) unless emergency. If any renal/dialysis needs from 7a-5p, please page me if questions   Ernest Haber, PA-C [facility] [phone] 03/21/2022,10:57 AM  LOS: 140 days   Labs: Basic Metabolic Panel: Recent Labs  Lab  03/14/22 1103 03/20/22 0010  NA 138 141  K 4.0 4.9  CL 99 102  CO2 27 25  GLUCOSE 110* 97  BUN 9 43*  CREATININE 4.08* 10.46*  CALCIUM 9.0 9.0   Liver Function Tests: No results for input(s): "AST", "ALT", "ALKPHOS", "BILITOT", "PROT", "ALBUMIN" in the last 168 hours. No results for input(s): "LIPASE", "AMYLASE" in the last 168 hours. No results for input(s): "AMMONIA" in the last 168 hours. CBC: Recent Labs  Lab 03/14/22 1103 03/15/22 0918 03/20/22 0010  WBC  --   --  10.9*  HGB 8.0* 7.7* 7.0*  HCT 26.9* 25.9* 23.1*  MCV  --   --  95.9  PLT  --   --  244   Cardiac Enzymes: No results for input(s): "CKTOTAL", "CKMB", "CKMBINDEX", "TROPONINI" in the last 168 hours. CBG: No results for input(s): "GLUCAP" in the last 168 hours.  Studies/Results: No results found. Medications:  sodium thiosulfate 12.5 g in sodium chloride 0.9 % 150 mL 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 (03/20/22 0315)    aspirin EC  81 mg Oral Daily   atorvastatin  10 mg Oral Daily   Chlorhexidine Gluconate Cloth  6 each Topical Q0600   darbepoetin (ARANESP) injection - DIALYSIS  200 mcg Subcutaneous Q Tue-1800   docusate sodium  100 mg Oral BID   famotidine  20 mg Oral Daily   feeding supplement  1 Container Oral TID BM   gabapentin  100 mg Oral Q1200   gabapentin  200 mg Oral QHS   Gerhardt's butt cream   Topical BID   heparin  4,000 Units Dialysis Once in dialysis   hydroquinone   Topical BID   lactulose  10 g Oral TID   levothyroxine  100 mcg Oral Q0600   loratadine  10 mg Oral Daily   melatonin  3 mg Oral QHS   midodrine  10 mg Oral Once per day on Tue Thu Sat   oxyCODONE  10 mg Oral Q12H   pantoprazole  40 mg Oral Daily   sevelamer carbonate  4.8 g Oral TID WC   sucroferric oxyhydroxide  500 mg Oral TID WC

## 2022-03-21 NOTE — Progress Notes (Signed)
Received the following results from lab: (97M-06) Hepatitis B Surface antigen- Non reactive. MD on call notified.

## 2022-03-21 NOTE — Progress Notes (Signed)
Progress Note   Patient: Courtney Gray ZOX:[id number] DOB: [birth year] DOA: 11/01/2021     140 DOS: the patient was seen and examined on 03/21/2022   Brief hospital course: 51 year old F ESRD on HD TTS,  recent lengthy hospitalization for Bacteroides Thetaiotaomicron bacteremia, hepatic and splenic abscess --required amputation of third digit of left hand and underwent left AKA and had splenic drain as well as CT-guided drainage of liver abscess she had a line holiday on last admission on long-term IV antibiotics until 11/05/1912 diastolic CHF, PVD s/p bilateral AKA, IDDM-2, morbid obesity and  chronic sacral wound presenting with progressive shortness of breath in the setting of missed dialysis and admitted for hypertensive emergency  Per patient, she never missed her dialysis however according to outpatient HD records, it takes at least 4 staff members and [facility] lift for patient to be seated in the dialysis chair.    Symptoms improved with hemodialysis.  Patient underwent R UE venogram on 8/30.  Vascular surgery following for possible RUE HD access.    Patient is now waiting placement where she can get HD on stretcher that has been difficult to find.  Assessment and Plan: End stage renal disease [facility]) Patient will continue renal replacement therapy per nephrology recommendations.  Needs placement to continue with HD as outpatient.  Very deconditioned, large sacral decubitus wound.   Continue renal replacement therapy per nephrology recommendations.   Anemia of chronic kidney disease, Hgb has been stable.  Continue with EPO and Iron supplementation.   Metabolic bone disease, continue with sevelamer.   Hyperkalemia, hypomagnesemia, hypokalemia, hyponatremia, hypophosphatemia have been corrected with renal replacement therapy.   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 Empiric treatment with thiosulfate 11/18. Discontinued due to persistent nausea and vomiting.  Patient complains of pain at  the subcutaneous nodules  Continue pain control.  She has been able to use electric scooter yesterday with good toleration.   Hypertension Patient on midodrine on HD to prevent hypotension. Currently off antihypertensive medications.   Decubitus ulcer of sacral region, stage 3 (HCC) Deep wound at the sacrum, patient not able to seat.   Continue with oral oxycodone. Continue with local wound care.    Type 2 diabetes mellitus with hyperlipidemia (HCC) Her glucose has been stable and insulin therapy has been discontinued.   Statin therapy with atorvastatin.   Bacteremia Patient has completed antibiotic therapy. Bacteroides, splenic abscess.   PVD (peripheral vascular disease) (HCC) Bilateral AKA.   Continue local wound care.   Now patient has an electric chair   Class 3 obesity (HCC) Calculated BMI is 42.6  Acute on chronic diastolic CHF (congestive heart failure) (HCC) Volume regulation with ultrafiltration in hemodialysis Continue midodrine for blood pressure support.   Nodule of skin of abdomen Noted to have areas of painful nodularity in her right flank as well as anterior left lower pannus.  These areas are mildly warm, but tender to palpation.  Tenderness has been present for 2 weeks, but she feels it is getting worse.  She has been afebrile and wbc count normal. CT abdomen did not show any underlying skin abscess. Discussed with Nephrology, Dr. 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 Recommended Gen surgery see for biopsy. I reached out to Saverio Danker, [place] with Gen Surgery, no biopsy recommended  Continue supportive medical care with oral analgesics and control of electrolytes through hemodialysis.         Subjective: patient feeling well today, she has been able to use electric scooter with good toleration.  Physical Exam: Vitals:   03/20/22 1729 03/20/22 2051 03/21/22 0518 03/21/22 0911  BP: 114/82 (!) 158/46 (!) 153/48 139/72   Pulse: 71 75 71 76  Resp: 17 18 17 18   Temp: 98.6 F (37 C) 98.3 F (36.8 C) 98.5 F (36.9 C) 98.1 F (36.7 C)  TempSrc:  Oral Oral Oral  SpO2: 100% 100% 100% 96%  Weight:      Height:       Neurology awake and alert ENT with no pallor Cardiovascular with S1 and S2 present and rhythmic Respiratory with no rales or wheezing Abdomen with no distention Bilateral BKA  Data Reviewed:    Family Communication: no family at the bedside   Disposition: Status is: Inpatient Remains inpatient appropriate because: pending placement   Planned Discharge Destination:  to be determined     Author: Tawni Millers, MD 03/21/2022 1:43 PM  For on call review [email or web address].

## 2022-03-21 NOTE — Progress Notes (Signed)
Patient informed staff yesterday that she can not find her phone. Stating that her phone went missing after her night shift nurse left her room. Staff with patient's consent searched her room and no phone was found. This morning patient informed staff that her daughter found the phone in a bag in her closet. Patient was reeducated on 's valuables policy. Patient was asked to allow staff to lock up her phone and any other valuables in a secure location or give the phone to her daughter to take back home. Patient stated she did not want to part with her phone. Patient still has her phone in her room.

## 2022-03-22 DIAGNOSIS — I5023 Acute on chronic systolic (congestive) heart failure: Secondary | ICD-10-CM | POA: Diagnosis not present

## 2022-03-22 DIAGNOSIS — E1169 Type 2 diabetes mellitus with other specified complication: Secondary | ICD-10-CM | POA: Diagnosis not present

## 2022-03-22 DIAGNOSIS — I10 Essential (primary) hypertension: Secondary | ICD-10-CM | POA: Diagnosis not present

## 2022-03-22 DIAGNOSIS — N186 End stage renal disease: Secondary | ICD-10-CM | POA: Diagnosis not present

## 2022-03-22 MED ORDER — HEPARIN SODIUM (PORCINE) 1000 UNIT/ML IJ SOLN
INTRAMUSCULAR | Status: AC
  Filled 2022-03-22: qty 5

## 2022-03-22 MED ORDER — SEVELAMER CARBONATE 0.8 G PO PACK
4.8000 g | PACK | Freq: Three times a day (TID) | ORAL | Status: DC
  Administered 2022-03-22 – 2022-05-15 (×88): 4.8 g via ORAL
  Filled 2022-03-22 (×110): qty 6

## 2022-03-22 NOTE — Progress Notes (Signed)
Progress Note   Patient: Courtney Gray [id number]:[id number] DOB: Jan 20, 1971 DOA: 11/01/2021     141 DOS: the patient was seen and examined on 03/22/2022   Brief hospital course: 51 year old F ESRD on HD TTS,  recent lengthy hospitalization for Bacteroides Thetaiotaomicron bacteremia, hepatic and splenic abscess --required amputation of third digit of left hand and underwent left AKA and had splenic drain as well as CT-guided drainage of liver abscess she had a line holiday on last admission on long-term IV antibiotics until 06/12/1694 diastolic CHF, PVD s/p bilateral AKA, IDDM-2, morbid obesity and  chronic sacral wound presenting with progressive shortness of breath in the setting of missed dialysis and admitted for hypertensive emergency  Per patient, she never missed her dialysis however according to outpatient HD records, it takes at least 4 staff members and [facility] lift for patient to be seated in the dialysis chair.    Symptoms improved with hemodialysis.  Patient underwent R UE venogram on 8/30.  Vascular surgery following for possible RUE HD access.    Patient is now waiting placement where she can get HD on stretcher that has been difficult to find.  Assessment and Plan: End stage renal disease [facility]) Patient will continue renal replacement therapy per nephrology recommendations.  Needs placement to continue with HD as outpatient.  Very deconditioned, large sacral decubitus wound.   Continue renal replacement therapy per nephrology recommendations.   Anemia of chronic kidney disease, Hgb has been stable.  Continue with EPO and Iron supplementation.   Metabolic bone disease, continue with sevelamer.   Hyperkalemia, hypomagnesemia, hypokalemia, hyponatremia, hypophosphatemia have been corrected with renal replacement therapy.   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 Empiric treatment with thiosulfate 11/18. Discontinued due to persistent nausea and vomiting.  Patient complains of pain at  the subcutaneous nodules  Continue pain control.  She has been able to use electric scooter yesterday with good toleration.   Hypertension Patient on midodrine on HD to prevent hypotension. Currently off antihypertensive medications.   Decubitus ulcer of sacral region, stage 3 (HCC) Deep wound at the sacrum, patient not able to seat.   Continue with oral oxycodone. Continue with local wound care.    Type 2 diabetes mellitus with hyperlipidemia (HCC) Her glucose has been stable and insulin therapy has been discontinued.   Statin therapy with atorvastatin.   Bacteremia Patient has completed antibiotic therapy. Bacteroides, splenic abscess.   PVD (peripheral vascular disease) (HCC) Bilateral AKA.   Continue local wound care.   Now patient has an electric chair   Class 3 obesity (HCC) Calculated BMI is 42.6  Acute on chronic diastolic CHF (congestive heart failure) (HCC) Volume regulation with ultrafiltration in hemodialysis Continue midodrine for blood pressure support.   Nodule of skin of abdomen Noted to have areas of painful nodularity in her right flank as well as anterior left lower pannus.  These areas are mildly warm, but tender to palpation.  Tenderness has been present for 2 weeks, but she feels it is getting worse.  She has been afebrile and wbc count normal. CT abdomen did not show any underlying skin abscess. Discussed with Nephrology, Dr. 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 Recommended Gen surgery see for biopsy. I reached out to Saverio Danker, [place] with Gen Surgery, no biopsy recommended  Continue supportive medical care with oral analgesics and control of electrolytes through hemodialysis.         Subjective: Patient is feeling better, she has been using her electrical scooter.   Physical Exam: Vitals:  03/21/22 1554 03/21/22 2033 03/22/22 0540 03/22/22 0924  BP: (!) 146/75 (!) 152/76 (!) 148/80 (!) 158/67  Pulse: 72 71 79 80   Resp: 18 18 18 18   Temp: 98.2 F (36.8 C) 97.7 F (36.5 C) 98.4 F (36.9 C) 98.2 F (36.8 C)  TempSrc: Oral Oral Oral Oral  SpO2: 96% 100% 100% 100%  Weight:      Height:       Neurology awake and alert ENT with mild pallor Cardiovascular with S1 and S2 present and rhythmic Respiratory with no rales or wheezing Abdomen with no distention  Bilateral BKA  Data Reviewed:    Family Communication: no family at the bedside   Disposition: Status is: Inpatient Remains inpatient appropriate because: pending disposition   Planned Discharge Destination: Skilled nursing facility  Author: Tawni Millers, MD 03/22/2022 11:33 AM  For on call review [email or web address].

## 2022-03-23 DIAGNOSIS — I10 Essential (primary) hypertension: Secondary | ICD-10-CM | POA: Diagnosis not present

## 2022-03-23 DIAGNOSIS — N186 End stage renal disease: Secondary | ICD-10-CM | POA: Diagnosis not present

## 2022-03-23 DIAGNOSIS — J9611 Chronic respiratory failure with hypoxia: Secondary | ICD-10-CM | POA: Diagnosis not present

## 2022-03-23 DIAGNOSIS — I5023 Acute on chronic systolic (congestive) heart failure: Secondary | ICD-10-CM | POA: Diagnosis not present

## 2022-03-23 MED ORDER — OXYCODONE HCL 5 MG PO TABS: 5.0000 mg | ORAL_TABLET | ORAL | Status: DC | PRN

## 2022-03-23 MED ORDER — OXYCODONE HCL 5 MG PO TABS
10.0000 mg | ORAL_TABLET | ORAL | Status: DC | PRN
  Administered 2022-03-24: 10 mg via ORAL
  Filled 2022-03-23: qty 2

## 2022-03-23 MED ORDER — LIDOCAINE-PRILOCAINE 2.5-2.5 % EX CREA
TOPICAL_CREAM | Freq: Once | CUTANEOUS | Status: DC
  Filled 2022-03-23 (×2): qty 5
  Filled 2022-03-23: qty 10

## 2022-03-23 NOTE — Progress Notes (Signed)
Progress Note   Patient: Courtney Gray [id number]:[id number] DOB: 29-Apr-1970 DOA: 11/01/2021     142 DOS: the patient was seen and examined on 03/23/2022   Brief hospital course: 51 year old F ESRD on HD TTS,  recent lengthy hospitalization for Bacteroides Thetaiotaomicron bacteremia, hepatic and splenic abscess --required amputation of third digit of left hand and underwent left AKA and had splenic drain as well as CT-guided drainage of liver abscess she had a line holiday on last admission on long-term IV antibiotics until 0/04/6008 diastolic CHF, PVD s/p bilateral AKA, IDDM-2, morbid obesity and  chronic sacral wound presenting with progressive shortness of breath in the setting of missed dialysis and admitted for hypertensive emergency  Per patient, she never missed her dialysis however according to outpatient HD records, it takes at least 4 staff members and [facility] lift for patient to be seated in the dialysis chair.    Symptoms improved with hemodialysis.  Patient underwent R UE venogram on 8/30.  Vascular surgery following for possible RUE HD access.    Patient is now waiting placement where she can get HD on stretcher that has been difficult to find.  Assessment and Plan: End stage renal disease [facility]) Patient will continue renal replacement therapy per nephrology recommendations.  Needs placement to continue with HD as outpatient.  Very deconditioned, large sacral decubitus wound.   Continue renal replacement therapy per nephrology recommendations.   Anemia of chronic kidney disease, Hgb has been stable.  Continue with EPO and Iron supplementation.   Metabolic bone disease, continue with sevelamer.   Hyperkalemia, hypomagnesemia, hypokalemia, hyponatremia, hypophosphatemia have been corrected with renal replacement therapy.   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 Empiric treatment with thiosulfate 11/18. Discontinued due to persistent nausea and vomiting.  Patient complains of pain at  the subcutaneous nodules  Continue pain control.  She has been able to use electric scooter yesterday with good toleration.   Hypertension Patient on midodrine on HD to prevent hypotension. Currently off antihypertensive medications.   Decubitus ulcer of sacral region, stage 3 (HCC) Deep wound at the sacrum, patient not able to seat.   Continue with oral oxycodone. Continue with local wound care.    Type 2 diabetes mellitus with hyperlipidemia (HCC) Her glucose has been stable and insulin therapy has been discontinued.   Statin therapy with atorvastatin.   Bacteremia Patient has completed antibiotic therapy. Bacteroides, splenic abscess.   PVD (peripheral vascular disease) (HCC) Bilateral AKA.   Continue local wound care.   Now patient has an electric chair   Class 3 obesity (HCC) Calculated BMI is 42.6  Acute on chronic diastolic CHF (congestive heart failure) (HCC) Volume regulation with ultrafiltration in hemodialysis Continue midodrine for blood pressure support.   Nodule of skin of abdomen Noted to have areas of painful nodularity in her right flank as well as anterior left lower pannus.  These areas are mildly warm, but tender to palpation.  Tenderness has been present for 2 weeks, but she feels it is getting worse.  She has been afebrile and wbc count normal. CT abdomen did not show any underlying skin abscess. Discussed with Nephrology, Dr. 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 Recommended Gen surgery see for biopsy. I reached out to Saverio Danker, [place] with Gen Surgery, no biopsy recommended  Continue supportive medical care with oral analgesics and control of electrolytes through hemodialysis.         Subjective: Patient feeling well, she continue to have back pain that is improved with analgesics,. No nausea  or vomiting.   Physical Exam: Vitals:   03/23/22 0018 03/23/22 0115 03/23/22 0632 03/23/22 0909  BP:  (!) 170/72 (!) 146/53  139/67  Pulse: 60 73 72 69  Resp:  19 18   Temp: 97.7 F (36.5 C) 97.7 F (36.5 C) 98.5 F (36.9 C) 98.4 F (36.9 C)  TempSrc:  Oral Oral Oral  SpO2: 100% 100% 100% 92%  Weight:      Height:       Neurology awake and alert ENT with no pallor Cardiovascular with S1 and S2 present and rhythmic Respiratory with no rales or wheezing Abdomen with no distention  Bilateral BKA  Data Reviewed:    Family Communication: no family at the bedside   Disposition: Status is: Inpatient Remains inpatient appropriate because: pending placement   Planned Discharge Destination: Home  Author: Tawni Millers, MD 03/23/2022 3:36 PM  For on call review [email or web address].

## 2022-03-24 DIAGNOSIS — N186 End stage renal disease: Secondary | ICD-10-CM | POA: Diagnosis not present

## 2022-03-24 DIAGNOSIS — E1169 Type 2 diabetes mellitus with other specified complication: Secondary | ICD-10-CM | POA: Diagnosis not present

## 2022-03-24 DIAGNOSIS — I5023 Acute on chronic systolic (congestive) heart failure: Secondary | ICD-10-CM | POA: Diagnosis not present

## 2022-03-24 DIAGNOSIS — I5033 Acute on chronic diastolic (congestive) heart failure: Secondary | ICD-10-CM | POA: Diagnosis not present

## 2022-03-24 MED ORDER — LIDOCAINE 5 % EX PTCH
1.0000 | MEDICATED_PATCH | CUTANEOUS | Status: DC
  Administered 2022-03-24: 1 via TRANSDERMAL
  Filled 2022-03-24: qty 1

## 2022-03-24 MED ORDER — LORATADINE 10 MG PO TABS
10.0000 mg | ORAL_TABLET | Freq: Every day | ORAL | Status: DC | PRN
  Filled 2022-03-24: qty 1

## 2022-03-24 MED ORDER — LIDOCAINE 5 % EX PTCH
2.0000 | MEDICATED_PATCH | CUTANEOUS | Status: DC
  Administered 2022-03-24 – 2022-03-25 (×2): 2 via TRANSDERMAL
  Filled 2022-03-24 (×7): qty 2

## 2022-03-24 MED ORDER — LIDOCAINE 5 % EX PTCH: 2.0000 | MEDICATED_PATCH | CUTANEOUS | Status: DC

## 2022-03-24 MED ORDER — OXYCODONE HCL 5 MG PO TABS
5.0000 mg | ORAL_TABLET | ORAL | Status: DC | PRN
  Administered 2022-03-24: 5 mg via ORAL
  Administered 2022-03-25: 10 mg via ORAL
  Administered 2022-03-25: 5 mg via ORAL
  Administered 2022-03-25 – 2022-03-26 (×2): 10 mg via ORAL
  Filled 2022-03-24: qty 2
  Filled 2022-03-24: qty 1
  Filled 2022-03-24 (×3): qty 2
  Filled 2022-03-24: qty 1
  Filled 2022-03-24: qty 2

## 2022-03-24 NOTE — Progress Notes (Signed)
PT Cancellation Note  Patient Details Name: Courtney Gray MRN: [id number] DOB: 12/27/1970   Cancelled Treatment:    Reason Eval/Treat Not Completed: Other (comment)  Contacted HD and found out she wasn't yet on their list for HD, so opted to try a PT session;  Pt was very sleepy (attributes this to pain meds), and politely declines;   Plan to return for PT session tomorrow at around 1:30pm;   Roney Marion, [place] [facility] [place] 03/24/2022, 2:14 PM

## 2022-03-24 NOTE — Progress Notes (Signed)
[facility] Progress Note   Subjective:   Sleeping this AM, awakens to voice but goes right back to sleep. No new events noted this weekend.   Objective Vitals:   03/23/22 0632 03/23/22 0909 03/23/22 2201 03/24/22 0859  BP: (!) 146/53 139/67 (!) 155/50 (!) 156/63  Pulse: 72 69 67 69  Resp: _0 Temp: 98.5 F (36.9 C) 98.4 F (36.9 C) 98.2 F (36.8 C) 98.4 F (36.9 C)  TempSrc: Oral Oral Oral Oral  SpO2: 100% 92% 100% 100%  Weight:      Height:       Physical Exam General: Obese female in NAD Heart: RRR, 2/6 systolic murmur Lungs: Lungs CTA but diminished anteriorly Abdomen: Soft, non-distended. Bandage present RLQ Extremities: b/l AKAs, no edema Dialysis Access:  [facility] with intact dressing  Additional Objective Labs: Basic Metabolic Panel: Recent Labs  Lab 03/20/22 0010  NA 141  K 4.9  CL 102  CO2 25  GLUCOSE 97  BUN 43*  CREATININE 10.46*  CALCIUM 9.0   Liver Function Tests: No results for input(s): "AST", "ALT", "ALKPHOS", "BILITOT", "PROT", "ALBUMIN" in the last 168 hours. No results for input(s): "LIPASE", "AMYLASE" in the last 168 hours. CBC: Recent Labs  Lab 03/20/22 0010  WBC 10.9*  HGB 7.0*  HCT 23.1*  MCV 95.9  PLT 244   Blood Culture    Component Value Date/Time   SDES BLOOD LEFT ANTECUBITAL 10/16/2021 1534   SPECREQUEST  10/16/2021 1534    BOTTLES DRAWN AEROBIC AND ANAEROBIC Blood Culture results may not be optimal due to an inadequate volume of blood received in culture bottles   CULT  10/16/2021 1534    NO GROWTH 5 DAYS Performed at [facility], [place] [address], [place], [place] [address]    REPTSTATUS 10/21/2021 FINAL 10/16/2021 1534    Cardiac Enzymes: No results for input(s): "CKTOTAL", "CKMB", "CKMBINDEX", "TROPONINI" in the last 168 hours. CBG: No results for input(s): "GLUCAP" in the last 168 hours. Iron Studies: No results for input(s): "IRON", "TIBC", "TRANSFERRIN", "FERRITIN" in the last 72  hours. _1 @ Studies/Results: No results found. Medications:  sodium thiosulfate 12.5 g in sodium chloride 0.9 % 150 mL Infusion for Calciphylaxis Stopped (03/23/22 0138)    aspirin EC  81 mg Oral Daily   atorvastatin  10 mg Oral Daily   Chlorhexidine Gluconate Cloth  6 each Topical Q0600   darbepoetin (ARANESP) injection - DIALYSIS  200 mcg Subcutaneous Q Tue-1800   docusate sodium  100 mg Oral BID   famotidine  20 mg Oral Daily   feeding supplement  1 Container Oral TID BM   gabapentin  100 mg Oral Q1200   gabapentin  200 mg Oral QHS   Gerhardt's butt cream   Topical BID   hydroquinone   Topical BID   levothyroxine  100 mcg Oral Q0600   lidocaine  2 patch Transdermal Q24H   lidocaine-prilocaine   Topical Once   midodrine  10 mg Oral Once per day on Tue Thu Sat   pantoprazole  40 mg Oral Daily   sevelamer carbonate  4.8 g Oral TID WC   sucroferric oxyhydroxide  500 mg Oral TID WC    Assessment/Plan: Inability to obtain HD at OP center D/T body habitus and wound: Difficult situation with no clear answers yet. PT working with pt -  met with seating specialist to get evaluated for custom chair/cushion that will allow her sit for longer periods.  Appreciate  efforts from PT team.  ESRD: Usual TTS schedule - was refusing HD at times but is now back on schedule, next HD tomorrow HD access =new TDC placed 02/04/2022, TDC stopped working again 11/30. IR consulted - S/p venogram, angioplasty of SVC and innominate veins, and TDC exchange.  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 Started empirically on sodium thiosulfate, but caused vomiting so stopped - now getting 1/2 dose - 12.5m q HD, refuses increased dose. Not on any calcium based products or vitamin D. Needs better control of Phos - have added Velphoro as well. HypoTN/volume: On midodrine 175mqHD, with 1053mrn mid HD w/parameters to  keep SBP >100 d/t becoming symptomatic when it drops lower. Significant weight loss this admit. Severe ischial sacral decubitus wound: CT showing chronic osteomyelitis. Limiting her, noted to be slow healing. Goal to tolerate sitting upright - which she is doing with PT.  ID started doxycycline which caused diarrhea - now off.  COPD/Chronic O2 use: Prior O2 at home (5L/min) -> tapered to 2-3L/min here Anemia of ESRD: Hgb down to 7.0- on max dose Aranesp 200m20meekly. Recent Fe load completed. Check CBC with next HD Secondary hyperparathyroidism: Phos 5.6 has been higher- ordered for Renvela 4.8g AC TID, refusing/missing at times. Added Velphoro 500mg23m to regimen. CorrCa elevated. Not on VDRA, last PTH 86 (low) on 03/11/22. Avoid all calcium based medications for Vitamin D d/t concern for calciphylaxis.  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 Nutrition - diet continues to be changed from renal.  Currently on regular diet.  Would really prefer she be on a renal diet but patient has been refusing Disposition: Difficult situation. Prior to admit, patient required 6 staff members and 2 hoyer pads for transfer into HD chair. Could not stay on HD for full tx. 2/2 pain of multiple decubitus ulcers and PTAR unable to lift pt into ambulance for transport.SW working on LTC at SNF. Con-wayl need to tolerate 5-6hrs in a chair to tolerate OP dialysis including transport, treatment and cannulation time.  The main issue is her Rt ischial wound. PT has arranged seating specialist with plan for custom power chair/cushion that would keep pressure off wounds and allow her to sit for longer periods   SamanAnice PaganiniC 03/24/2022, 9:55 AM   [facility] Pager: [phone]

## 2022-03-24 NOTE — Progress Notes (Signed)
PROGRESS NOTE    Courtney Gray  OEU:[id number] DOB: [birth year] DOA: 11/01/2021 PCP: Monico Blitz, MD   Brief Narrative: Courtney Gray is a 51 y.o. female with a history of ESRD on HD, chronic heart failure, PAD, bilateral AKA, diabetes mellitus, morbid obesity, pressure injury. Patient presented secondary to shortness of breath which was consistent with acute heart failure that responded to hemodialysis. Discharge is pending ability to obtain outpatient hemodialysis that can accommodate patient's mobility constraints secondary to decubitus ulcer wound.  Assessment and Plan:  Acute on chronic diastolic heart failure Present on admission with associated pulmonary edema. Treated with hemodialysis. Acute process resolved. -Continue fluid management with hemodialysis.  Chronic respiratory failure Stable.  Acute pulmonary edema Secondary to acute heart failure and managed with hemodialysis.  Hypertensive emergency BP of 181/81 mmHg with associated acute heart failure on admission. Started on Imdur and managed with hemodialysis. Resolved.  ESRD on HD Nephrology on board for hemodialysis  Anemia of chronic kidney disease Hemoglobin stable.  Diabetes mellitus type 2 Well controlled on insulin. Hemoglobin A1C of 5.7%. 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 -Continue aspirin  Hypomagnesemia Supplementation given.  Leukocytosis Mild and stable.   Irritable bowel syndrome with constipation -Continue Lactulose PRN  History of septic shock secondary to bacteroides bacteremia with liver and splenic abscesses Diagnosed on prior hospitalization. Patient completed antibiotic course this admission. Abdominal drain removed by IR on 8/9.  Morbid obesity Body mass index is 44.6 kg/m.  Constipation -Lactulose PRN  Pressure injury Right buttocks/right hip present on  admission. Left/lower flank, not present on admission. -Wound care recommendations (11/28): Wash the wounds on the right hip area and LLQ of the abdomen with saline, pat dry. Place xeroform and gauze to the areas. Change daily.    DVT prophylaxis: heparin subq Code Status:   Code Status: Full Code Family Communication: None at bedside Disposition Plan: Discharge to SNF pending ability to receive outpatient HD. Barrier includes not being able to sit up for hemodialysis secondary to pressure injury wound   Consultants:  Nephrology Interventional radiology Infectious disease Vascular surgery  Procedures:  Exchange of left IJ tunneled HD cath (8/22) Right upper extremity venogram/Central venogram (8/30)  Antimicrobials: Vancomycin Cefepime Flagyl    Subjective: Patient is tired today secondary to oxycodone. She feels that she needs more lactulose. No other issues.  Objective: BP (!) 155/50 (BP Location: Left Arm)   Pulse 67   Temp 98.2 F (36.8 C) (Oral)   Resp 18   Ht 5\' 5"  (1.651 m)   Wt 121.6 kg   SpO2 100%   BMI 44.60 kg/m   Examination:  General exam: Appears calm and comfortable  Respiratory system: Respiratory effort normal. Central nervous system: Alert and oriented. Musculoskeletal: Bilateral AKA.   Data Reviewed: I have personally reviewed following labs and imaging studies  CBC Lab Results  Component Value Date   WBC 10.9 (H) 03/20/2022   RBC 2.41 (L) 03/20/2022   HGB 7.0 (L) 03/20/2022   HCT 23.1 (L) 03/20/2022   MCV 95.9 03/20/2022   MCH 29.0 03/20/2022   PLT 244 03/20/2022   MCHC 30.3 03/20/2022   RDW 15.8 (H) 03/20/2022   LYMPHSABS 1.2 01/21/2022   MONOABS 0.8 01/21/2022   EOSABS 0.5 01/21/2022   BASOSABS 0.1 15/40/0867     Last metabolic panel Lab Results  Component Value Date   NA 141 03/20/2022  K 4.9 03/20/2022   CL 102 03/20/2022   CO2 25 03/20/2022   BUN 43 (H) 03/20/2022   CREATININE 10.46 (H) 03/20/2022   GLUCOSE 97  03/20/2022   GFRNONAA 4 (L) 03/20/2022   GFRAA 6 (L) 11/29/2019   CALCIUM 9.0 03/20/2022   PHOS 5.6 (H) 03/08/2022   PROT 8.3 (H) 02/27/2022   ALBUMIN 2.7 (L) 03/08/2022   BILITOT 0.3 02/27/2022   ALKPHOS 66 02/27/2022   AST 17 02/27/2022   ALT 11 02/27/2022   ANIONGAP 14 03/20/2022    GFR: Estimated Creatinine Clearance: 8.3 mL/min (A) (by C-G formula based on SCr of 10.46 mg/dL (H)).  No results found for this or any previous visit (from the past 240 hour(s)).    Radiology Studies: No results found.    LOS: 143 days    Cordelia Poche, MD [facility] 03/24/2022, 7:14 AM   If 7PM-7AM, please contact night-coverage [email or web address]

## 2022-03-25 DIAGNOSIS — I5023 Acute on chronic systolic (congestive) heart failure: Secondary | ICD-10-CM | POA: Diagnosis not present

## 2022-03-25 DIAGNOSIS — I5033 Acute on chronic diastolic (congestive) heart failure: Secondary | ICD-10-CM | POA: Diagnosis not present

## 2022-03-25 DIAGNOSIS — N186 End stage renal disease: Secondary | ICD-10-CM | POA: Diagnosis not present

## 2022-03-25 DIAGNOSIS — E1169 Type 2 diabetes mellitus with other specified complication: Secondary | ICD-10-CM | POA: Diagnosis not present

## 2022-03-25 LAB — CBC
HCT: 23.3 % — ABNORMAL LOW (ref 36.0–46.0)
Hemoglobin: 6.9 g/dL — CL (ref 12.0–15.0)
MCH: 28.8 pg (ref 26.0–34.0)
MCHC: 29.6 g/dL — ABNORMAL LOW (ref 30.0–36.0)
MCV: 97.1 fL (ref 80.0–100.0)
Platelets: 252 10*3/uL (ref 150–400)
RBC: 2.4 MIL/uL — ABNORMAL LOW (ref 3.87–5.11)
RDW: 16 % — ABNORMAL HIGH (ref 11.5–15.5)
WBC: 8.8 10*3/uL (ref 4.0–10.5)
nRBC: 0 % (ref 0.0–0.2)

## 2022-03-25 LAB — BASIC METABOLIC PANEL
Anion gap: 14 (ref 5–15)
BUN: 38 mg/dL — ABNORMAL HIGH (ref 6–20)
CO2: 26 mmol/L (ref 22–32)
Calcium: 9.3 mg/dL (ref 8.9–10.3)
Chloride: 98 mmol/L (ref 98–111)
Creatinine, Ser: 9.76 mg/dL — ABNORMAL HIGH (ref 0.44–1.00)
GFR, Estimated: 4 mL/min — ABNORMAL LOW (ref >60)
Glucose, Bld: 106 mg/dL — ABNORMAL HIGH (ref 70–99)
Potassium: 4.9 mmol/L (ref 3.5–5.1)
Sodium: 138 mmol/L (ref 135–145)

## 2022-03-25 LAB — RENAL FUNCTION PANEL
Albumin: 2.6 g/dL — ABNORMAL LOW (ref 3.5–5.0)
Anion gap: 16 — ABNORMAL HIGH (ref 5–15)
BUN: 38 mg/dL — ABNORMAL HIGH (ref 6–20)
CO2: 25 mmol/L (ref 22–32)
Calcium: 9.3 mg/dL (ref 8.9–10.3)
Chloride: 98 mmol/L (ref 98–111)
Creatinine, Ser: 9.69 mg/dL — ABNORMAL HIGH (ref 0.44–1.00)
GFR, Estimated: 4 mL/min — ABNORMAL LOW (ref >60)
Glucose, Bld: 116 mg/dL — ABNORMAL HIGH (ref 70–99)
Phosphorus: 5.1 mg/dL — ABNORMAL HIGH (ref 2.5–4.6)
Potassium: 4.9 mmol/L (ref 3.5–5.1)
Sodium: 139 mmol/L (ref 135–145)

## 2022-03-25 LAB — PREPARE RBC (CROSSMATCH)

## 2022-03-25 MED ORDER — ALTEPLASE 2 MG IJ SOLR: 2.0000 mg | Freq: Once | INTRAMUSCULAR | Status: DC | PRN

## 2022-03-25 MED ORDER — ANTICOAGULANT SODIUM CITRATE 4% (200MG/5ML) IV SOLN
5.0000 mL | Status: DC | PRN
  Filled 2022-03-25: qty 5

## 2022-03-25 MED ORDER — LIDOCAINE HCL (PF) 1 % IJ SOLN: 5.0000 mL | INTRAMUSCULAR | Status: DC | PRN

## 2022-03-25 MED ORDER — HEPARIN SODIUM (PORCINE) 1000 UNIT/ML IJ SOLN
INTRAMUSCULAR | Status: AC
  Administered 2022-03-25: 4200 [IU]
  Filled 2022-03-25: qty 4

## 2022-03-25 MED ORDER — HEPARIN SODIUM (PORCINE) 1000 UNIT/ML DIALYSIS
4000.0000 [IU] | Freq: Once | INTRAMUSCULAR | Status: AC
  Administered 2022-03-25: 4000 [IU] via INTRAVENOUS_CENTRAL
  Filled 2022-03-25 (×2): qty 4

## 2022-03-25 MED ORDER — LIDOCAINE-PRILOCAINE 2.5-2.5 % EX CREA: 1.0000 | TOPICAL_CREAM | CUTANEOUS | Status: DC | PRN

## 2022-03-25 MED ORDER — PENTAFLUOROPROP-TETRAFLUOROETH EX AERO: 1.0000 | INHALATION_SPRAY | CUTANEOUS | Status: DC | PRN

## 2022-03-25 MED ORDER — SODIUM CHLORIDE 0.9% IV SOLUTION: Freq: Once | INTRAVENOUS | Status: DC

## 2022-03-25 NOTE — Progress Notes (Signed)
[facility] Progress Note   Subjective:   Seen in room. Reports she feels like she has more fluid on today, requests UF goal 3L. Denies SOB, CP, dizziness, abdominal pain and nausea. Thinks pain meds were oversedating this weekend but feels better now.   Objective Vitals:   03/24/22 0859 03/24/22 2054 03/25/22 0506 03/25/22 0827  BP: (!) 156/63 (!) 146/35 (!) 143/94 (!) 152/75  Pulse: 69 65 86 71  Resp: _0 Temp: 98.4 F (36.9 C) 98.1 F (36.7 C) 97.9 F (36.6 C) 98.2 F (36.8 C)  TempSrc: Oral Oral Oral Oral  SpO2: 100% 100% 100% 97%  Weight:      Height:       Physical Exam General: Obese female in NAD Heart: RRR, 2/6 systolic murmur Lungs: Lungs CTA but diminished anteriorly Abdomen: Soft, non-distended. Bandage present RLQ Extremities: b/l AKAs, no edema Dialysis Access:  [facility] with intact dressing  Additional Objective Labs: Basic Metabolic Panel: Recent Labs  Lab 03/20/22 0010  NA 141  K 4.9  CL 102  CO2 25  GLUCOSE 97  BUN 43*  CREATININE 10.46*  CALCIUM 9.0   Liver Function Tests: No results for input(s): "AST", "ALT", "ALKPHOS", "BILITOT", "PROT", "ALBUMIN" in the last 168 hours. No results for input(s): "LIPASE", "AMYLASE" in the last 168 hours. CBC: Recent Labs  Lab 03/20/22 0010  WBC 10.9*  HGB 7.0*  HCT 23.1*  MCV 95.9  PLT 244   Blood Culture    Component Value Date/Time   SDES BLOOD LEFT ANTECUBITAL 10/16/2021 1534   SPECREQUEST  10/16/2021 1534    BOTTLES DRAWN AEROBIC AND ANAEROBIC Blood Culture results may not be optimal due to an inadequate volume of blood received in culture bottles   CULT  10/16/2021 1534    NO GROWTH 5 DAYS Performed at [facility], [place] [address] [place], [place] [address]    REPTSTATUS 10/21/2021 FINAL 10/16/2021 1534    Cardiac Enzymes: No results for input(s): "CKTOTAL", "CKMB", "CKMBINDEX", "TROPONINI" in the last 168 hours. CBG: No results for input(s): "GLUCAP" in the  last 168 hours. Iron Studies: No results for input(s): "IRON", "TIBC", "TRANSFERRIN", "FERRITIN" in the last 72 hours. _1 @ Studies/Results: No results found. Medications:  sodium thiosulfate 12.5 g in sodium chloride 0.9 % 150 mL Infusion for Calciphylaxis Stopped (03/23/22 0138)    aspirin EC  81 mg Oral Daily   atorvastatin  10 mg Oral Daily   Chlorhexidine Gluconate Cloth  6 each Topical Q0600   darbepoetin (ARANESP) injection - DIALYSIS  200 mcg Subcutaneous Q Tue-1800   docusate sodium  100 mg Oral BID   famotidine  20 mg Oral Daily   feeding supplement  1 Container Oral TID BM   gabapentin  100 mg Oral Q1200   gabapentin  200 mg Oral QHS   Gerhardt's butt cream   Topical BID   hydroquinone   Topical BID   levothyroxine  100 mcg Oral Q0600   lidocaine  2 patch Transdermal Q24H   lidocaine-prilocaine   Topical Once   midodrine  10 mg Oral Once per day on Tue Thu Sat   pantoprazole  40 mg Oral Daily   sevelamer carbonate  4.8 g Oral TID WC   sucroferric oxyhydroxide  500 mg Oral TID WC   Assessment/Plan: Inability to obtain HD at OP center D/T body habitus and wound: Difficult situation with no clear answers yet. PT working with pt -  met with  seating specialist to get evaluated for custom chair/cushion that will allow her sit for longer periods.  Appreciate efforts from PT team.  ESRD: Usual TTS schedule - was refusing HD at times but is now back on schedule, next HD today HD access: new TDC placed 02/04/2022, TDC stopped working again 11/30. IR consulted - S/p venogram, angioplasty of SVC and innominate veins, and TDC exchange.  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 Started empirically on sodium thiosulfate, but caused vomiting so stopped - now getting 1/2 dose - 12.50m q HD, refuses increased dose. Not on any calcium based products or vitamin D. Needs better control of Phos  - have added Velphoro as well. HypoTN/volume: On midodrine 133mqHD, with 1083mrn mid HD w/parameters to keep SBP >100 d/t becoming symptomatic when it drops lower. Significant weight loss this admit. Severe ischial sacral decubitus wound: CT showing chronic osteomyelitis. Limiting her, noted to be slow healing. Goal to tolerate sitting upright - which she is doing with PT.  ID started doxycycline which caused diarrhea - now off.  COPD/Chronic O2 use: Prior O2 at home (5L/min) -> tapered to 2-3L/min here Anemia of ESRD: Hgb down to 7.0- on max dose Aranesp 200m36meekly. Recent Fe load completed. Check CBC with next HD Secondary hyperparathyroidism: Phos 5.6 has been higher- ordered for Renvela 4.8g AC TID, refusing/missing at times. Added Velphoro 500mg26m to regimen. CorrCa elevated. Not on VDRA, last PTH 86 (low) on 03/11/22. Avoid all calcium based medications for Vitamin D d/t concern for calciphylaxis.  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 Nutrition - diet continues to be changed from renal.  Currently on regular diet.  Would really prefer she be on a renal diet but patient has been refusing Disposition: Difficult situation. Prior to admit, patient required 6 staff members and 2 hoyer pads for transfer into HD chair. Could not stay on HD for full tx. 2/2 pain of multiple decubitus ulcers and PTAR unable to lift pt into ambulance for transport.SW working on LTC at SNF. Con-wayl need to tolerate 5-6hrs in a chair to tolerate OP dialysis including transport, treatment and cannulation time.  The main issue is her Rt ischial wound. PT has arranged seating specialist with plan for custom power chair/cushion that would keep pressure off wounds and allow her to sit for longer periods     SamanAnice PaganiniC 03/25/2022, 10:30 AM  [facility] Pager: [phone]

## 2022-03-25 NOTE — Progress Notes (Signed)
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    PT Comments    Continuing work on functional mobility and activity tolerance;  Session focused on OOB to power chair transfer, employing anterior-posterior technique with good success; increased time for transfer, but overall seemed pleased to get in this chair; able to get into a position of relative comfort after about 15 minutes in power chair; took extra time to use control panel -- pt taught back most of power chair controls, but needed to trial and error how to get to tilt and recline modes;   This session was quite promising -- pt had not had pain meds, and still successfully got to the power chair with min assist of 2, and wanted to keep practicing with the power chair; she is showing motivation to get moving more  Recommendations for follow up therapy are one component of a multi-disciplinary discharge planning process, led by the attending physician.  Recommendations may be updated based on patient status, additional functional criteria and insurance authorization.  Follow Up Recommendations  Skilled nursing-short term rehab (<3 hours/day) Can patient physically be transported by private vehicle: No   Assistance Recommended at Discharge Intermittent Supervision/Assistance  Patient can return home with the following A lot of help with walking and/or transfers;A lot of help with bathing/dressing/bathroom;Assist for transportation;Help with stairs or ramp for entrance   Equipment Recommendations  Wheelchair  (measurements PT);Wheelchair cushion (measurements PT);Other (comment);Hospital bed (more accomodating mechanical lift, with sling that handles pt's with bil AKAs; Roho cushion (20x24), power wheelchair with power tilt and recline)    Recommendations for Other Services       Precautions / Restrictions Precautions Precautions: Fall Precaution Comments: bil AKA, 2L O2 at baseline (has been tolerating mobility in power chair on [employer] fine); when in power chair, MUST perform pressure relief every 30 minutes by tilting back for 10 minutes Restrictions Other Position/Activity Restrictions: when in power chair, MUST perform pressure relief every 30 minutes by tilting back for 10 minutes     Mobility  Bed Mobility Overal bed mobility: Modified Independent             General bed mobility comments: pulls on rails to sit upright in bed    Transfers Overall transfer level: Needs assistance   Transfers: Bed to chair/wheelchair/BSC         Anterior-Posterior transfers: Min assist, +2 safety/equipment, From elevated surface   General transfer comment: Required min assist just to glide bed pad backwards into w/c to reduce sheering forces; Short reciprocal scoots backwards; with decr efficiency, and incr time; shifting weight and pushing through UEs placed in front of her to shift weight off of seat for scooting    Ambulation/Gait                   Theme park manager mobility: Yes Wheelchair propulsion:  (Power) Wheelchair parts: Other (comment) Distance:  (Off unit with Mobility Specialist at end of session) Wheelchair Assistance Details (indicate cue type and reason):  Took extra time today to get used to the new power chair and the different controls than the previous demo chair; Pt able to demonstrate how to toggle between drive and positioning modes; needed trial and error to go through modes for seat and back  positions -- tilt, recline, etc  Modified Rankin (Stroke Patients Only)       Balance     Sitting balance-[name] Scale: Good                                      Cognition Arousal/Alertness: Awake/alert Behavior During Therapy: WFL for tasks assessed/performed Overall Cognitive Status: Within Functional Limits for tasks assessed                                 General Comments: Showing more understanding of directions with transfers; Able to verbalize to Mob Specialist        Exercises      General Comments General comments (skin integrity, edema, etc.): This PT applied topical analgesic and reinforced dressings of R abdominal wounds      Pertinent Vitals/Pain Pain Assessment Pain Assessment: Faces Faces Pain Scale: Hurts even more Pain Location: abdomen wounds Pain Descriptors / Indicators: Grimacing, Discomfort Pain Intervention(s): Monitored during session, RN gave pain meds during session    Home Living                          Prior Function            PT Goals (current goals can now be found in the care plan section) Acute Rehab PT Goals Patient Stated Goal: Get up and use power chair PT Goal Formulation: With patient Time For Goal Achievement: 03/27/22 Potential to Achieve Goals: Good Progress towards PT goals: Progressing toward goals    Frequency    Min 2X/week      PT Plan Current plan remains appropriate    Co-evaluation              AM-PAC PT "6 Clicks" Mobility   Outcome Measure  Help needed turning from your back to your side while in a flat bed without using bedrails?: None Help needed moving from lying on your back to sitting on the side of a flat bed without using bedrails?: None Help needed moving to and from a bed to a chair (including a wheelchair)?: Total Help needed standing up from a chair using your arms (e.g., wheelchair or bedside chair)?: Total Help needed to walk in hospital  room?: Total Help needed climbing 3-5 steps with a railing? : Total 6 Click Score: 12    End of Session   Activity Tolerance: Patient tolerated treatment well Patient left: Other (comment) (Rolling in hallway with Mobility Specialist) Nurse Communication: Mobility status;Need for lift equipment PT Visit Diagnosis: Other abnormalities of gait and mobility (R26.89);Muscle weakness (generalized) (M62.81) Pain - Right/Left: Right Pain - part of body: Hip;Leg (ischial tuberosity; R abdominal nodular pain)     Time: 2440-1027 PT Time Calculation (min) (ACUTE ONLY): 68 min  Charges:  $Therapeutic Activity: 23-37 mins $Wheel Chair Management: 23-37 mins                     Roney Marion, PT  Acute Rehabilitation Services Office [place] 03/25/2022,  12:53 PM

## 2022-03-25 NOTE — Progress Notes (Signed)
Received patient in bed before the start of the treatment.Vitals stable .  Access used :Left HD catheter that works well.Dressing on date.  Duration of treatment : 2 hours and 5 3 minutes  Bolus given Sodium thiosulfate 12.5 g= 150 cc  Medicine given:Sodium thiosulfate 12.5 g.  Fluid removed: 1,100 cc.  Hemodialysis tx issue: One unit of blood was not given due to circuit clogged during the last 40 minutes of her treatment.Patient was leaning or lying on her tube and had to awaken patient every now and then.  Hands off to patients' nurse.

## 2022-03-25 NOTE — Progress Notes (Signed)
Mobility Specialist Progress Note   03/25/22 1310  Mobility  Activity Transferred from chair to bed  Level of Assistance +2 (takes two people)  Risk analyst  Activity Response Tolerated well  $Mobility charge 1 Mobility   After assisting PT w/ getting pt in motorized w/c, pt motorized self throughout hallways practicing proper w/c mechanics + learning ways to alleviate pressure wound under RLE. Pt actively expressing discomfort throughout the way but able to returned back to room w/o fault. PT assisted getting pt back via posterior scoot from w/c. Left w/ call bell in reach and all needs met.  Holland Falling Mobility Specialist Please contact via SecureChat or  Rehab office at [phone]

## 2022-03-25 NOTE — Progress Notes (Signed)
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    PT Comments    Continuing work on functional mobility and activity tolerance;  Shorter session this afternoon, focused on functional transfer back to bed from power chair; performed essentially an anterior posterior transfer down hill from power chair to bed with min assist (assist mostly to keep closest bed pad unmoving against her skin); Good progress, and hopeful for the ability to get to and from power chair daily   Recommendations for follow up therapy are one component of a multi-disciplinary discharge planning process, led by the attending physician.  Recommendations may be updated based on patient status, additional functional criteria and insurance authorization.  Follow Up Recommendations  Skilled nursing-short term rehab (<3 hours/day) Can patient physically be transported by private vehicle: No   Assistance Recommended at Discharge Intermittent Supervision/Assistance  Patient can return home with the following A lot of help with walking and/or transfers;A lot of help with bathing/dressing/bathroom;Assist for transportation;Help with stairs or ramp for entrance   Equipment Recommendations  Wheelchair (measurements PT);Wheelchair cushion (measurements PT);Other (comment);Hospital bed (more accomodating mechanical lift, with sling that handles pt's with bil AKAs; Roho cushion (20x24), power wheelchair with power tilt and recline)    Recommendations for Other Services       Precautions / Restrictions  Precautions Precautions: Fall Precaution Comments: bil AKA, 2L O2 at baseline (has been tolerating mobility in power chair on Room[employer] fine); when in power chair, MUST perform pressure relief every 30 minutes by tilting back for 10 minutes Restrictions Other Position/Activity Restrictions: when in power chair, MUST perform pressure relief every 30 minutes by tilting back for 10 minutes     Mobility  Bed Mobility Overal bed mobility: Modified Independent             General bed mobility comments: pulls on rails to sit upright in bed    Transfers Overall transfer level: Needs assistance Equipment used:  (bed pad) Transfers: Bed to chair/wheelchair/BSC         Anterior-Posterior transfers: Min assist, +2 safety/equipment, From elevated surface   General transfer comment: Initiated transfer as a lateral scoot from wc back to bed on her L; after a few scoots, pt oriented her body in the wc as an ant-post transfer, and overall needed min assist adn minguard; second person at times out of the room getting supplies, and pt able to complete transfer to bed with one person assist -- but close guard    Ambulation/Gait                   Theme park manager mobility: Yes Wheelchair propulsion:  (Power) Wheelchair parts: Other (comment) Distance:  (Off unit with Mobility Specialist at end of session) Wheelchair Assistance Details (indicate cue type and reason): Took extra time today to get used to the new power chair and the different controls than the previous demo chair; Pt able to demonstrate how to toggle between drive and positioning modes; needed trial and error to go  through modes for seat and back positions -- tilt, recline, etc  Modified Rankin (Stroke Patients Only)       Balance     Sitting balance-[name] Scale: Good                                      Cognition Arousal/Alertness:  Awake/alert Behavior During Therapy: WFL for tasks assessed/performed Overall Cognitive Status: Within Functional Limits for tasks assessed                                 General Comments: Showing more understanding of directions with transfers; Able to verbalize to Mob Specialist        Exercises      General Comments General comments (skin integrity, edema, etc.): Checked  O2 sats after mobilizing inhallways without supplemental O2, and satting at 92%      Pertinent Vitals/Pain Pain Assessment Pain Assessment: Faces Faces Pain Scale: Hurts little more Pain Location: abdomen wounds Pain Descriptors / Indicators: Grimacing, Discomfort Pain Intervention(s): Monitored during session    Home Living                          Prior Function            PT Goals (current goals can now be found in the care plan section) Acute Rehab PT Goals Patient Stated Goal: back to bed to eat before HD PT Goal Formulation: With patient Time For Goal Achievement: 03/27/22 Potential to Achieve Goals: Good Progress towards PT goals: Progressing toward goals    Frequency    Min 2X/week      PT Plan Current plan remains appropriate    Co-evaluation              AM-PAC PT "6 Clicks" Mobility   Outcome Measure  Help needed turning from your back to your side while in a flat bed without using bedrails?: None Help needed moving from lying on your back to sitting on the side of a flat bed without using bedrails?: None Help needed moving to and from a bed to a chair (including a wheelchair)?: Total Help needed standing up from a chair using your arms (e.g., wheelchair or bedside chair)?: Total Help needed to walk in hospital room?: Total Help needed climbing 3-5 steps with a railing? : Total 6 Click Score: 12    End of Session Equipment Utilized During Treatment:  (bed pads) Activity Tolerance: Patient tolerated treatment well Patient left: in chair;with  call bell/phone within reach (eating lunch before HD) Nurse Communication: Mobility status;Need for lift equipment PT Visit Diagnosis: Other abnormalities of gait and mobility (R26.89);Muscle weakness (generalized) (M62.81) Pain - Right/Left: Right Pain - part of body: Hip;Leg (ischial tuberosity; R abdominal nodular pain)     Time: 1300-1320 PT Time Calculation (min) (ACUTE ONLY): 20 min  Charges:  $Therapeutic Activity: 8-22 mins                     Roney Marion, [place] Office [phone]    Courtney Gray 03/25/2022, 5:06 PM

## 2022-03-25 NOTE — Progress Notes (Signed)
PROGRESS NOTE    Courtney Gray  YSA:[id number] DOB: [birth year] DOA: 11/01/2021 PCP: Monico Blitz, MD   Brief Narrative: Courtney Gray is a 51 y.o. female with a history of ESRD on HD, chronic heart failure, PAD, bilateral AKA, diabetes mellitus, morbid obesity, pressure injury. Patient presented secondary to shortness of breath which was consistent with acute heart failure that responded to hemodialysis. Discharge is pending ability to obtain outpatient hemodialysis that can accommodate patient's mobility constraints secondary to decubitus ulcer wound.  Assessment and Plan:  Acute on chronic diastolic heart failure Present on admission with associated pulmonary edema. Treated with hemodialysis. Acute process resolved. -Continue fluid management with hemodialysis.  Chronic respiratory failure Stable.  Acute pulmonary edema Secondary to acute heart failure and managed with hemodialysis.  Hypertensive emergency BP of 181/81 mmHg with associated acute heart failure on admission. Started on Imdur and managed with hemodialysis. Resolved.  ESRD on HD Nephrology on board for hemodialysis  Anemia of chronic kidney disease Hemoglobin stable.  Diabetes mellitus type 2 Well controlled on insulin. Hemoglobin A1C of 5.7%. 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 -Continue aspirin  Hypomagnesemia Supplementation given.  Leukocytosis Mild and stable.   Irritable bowel syndrome with constipation -Continue Lactulose PRN  History of septic shock secondary to bacteroides bacteremia with liver and splenic abscesses Diagnosed on prior hospitalization. Patient completed antibiotic course this admission. Abdominal drain removed by IR on 8/9.  Morbid obesity Body mass index is 44.6 kg/m.  Constipation -Lactulose PRN  Pressure injury Right buttocks/right hip present on  admission. Left/lower flank, not present on admission. -Wound care recommendations (11/28): Wash the wounds on the right hip area and LLQ of the abdomen with saline, pat dry. Place xeroform and gauze to the areas. Change daily.    DVT prophylaxis: heparin subq Code Status:   Code Status: Full Code Family Communication: None at bedside Disposition Plan: Discharge to SNF pending ability to receive outpatient HD. Barrier includes not being able to sit up for hemodialysis secondary to pressure injury wound   Consultants:  Nephrology Interventional radiology Infectious disease Vascular surgery  Procedures:  Exchange of left IJ tunneled HD cath (8/22) Right upper extremity venogram/Central venogram (8/30)  Antimicrobials: Vancomycin Cefepime Flagyl    Subjective: Patient states she is still somewhat tired from oxycodone. She is hoping to transfer to chair today. She reports no bowel movement overnight.  Objective: BP (!) 152/75   Pulse 71   Temp 98.2 F (36.8 C) (Oral)   Resp 18   Ht 5\' 5"  (1.651 m)   Wt 121.6 kg   SpO2 97%   BMI 44.60 kg/m   Examination:  General exam: Appears calm and comfortable Respiratory system: Respiratory effort normal. Central nervous system: Alert and oriented. Musculoskeletal: Bilateral AKA Psychiatry: Judgement and insight appear normal. Mood & affect appropriate.    Data Reviewed: I have personally reviewed following labs and imaging studies  CBC Lab Results  Component Value Date   WBC 10.9 (H) 03/20/2022   RBC 2.41 (L) 03/20/2022   HGB 7.0 (L) 03/20/2022   HCT 23.1 (L) 03/20/2022   MCV 95.9 03/20/2022   MCH 29.0 03/20/2022   PLT 244 03/20/2022   MCHC 30.3 03/20/2022   RDW 15.8 (H) 03/20/2022   LYMPHSABS 1.2 01/21/2022   MONOABS 0.8 01/21/2022   EOSABS 0.5 01/21/2022   BASOSABS 0.1 32/35/5732     Last metabolic panel Lab  Results  Component Value Date   NA 141 03/20/2022   K 4.9 03/20/2022   CL 102 03/20/2022   CO2  25 03/20/2022   BUN 43 (H) 03/20/2022   CREATININE 10.46 (H) 03/20/2022   GLUCOSE 97 03/20/2022   GFRNONAA 4 (L) 03/20/2022   GFRAA 6 (L) 11/29/2019   CALCIUM 9.0 03/20/2022   PHOS 5.6 (H) 03/08/2022   PROT 8.3 (H) 02/27/2022   ALBUMIN 2.7 (L) 03/08/2022   BILITOT 0.3 02/27/2022   ALKPHOS 66 02/27/2022   AST 17 02/27/2022   ALT 11 02/27/2022   ANIONGAP 14 03/20/2022    GFR: Estimated Creatinine Clearance: 8.3 mL/min (A) (by C-G formula based on SCr of 10.46 mg/dL (H)).  No results found for this or any previous visit (from the past 240 hour(s)).    Radiology Studies: No results found.    LOS: 144 days    Cordelia Poche, MD [facility] 03/25/2022, 9:28 AM   If 7PM-7AM, please contact night-coverage [email or web address]

## 2022-03-26 DIAGNOSIS — I5033 Acute on chronic diastolic (congestive) heart failure: Secondary | ICD-10-CM | POA: Diagnosis not present

## 2022-03-26 DIAGNOSIS — I5023 Acute on chronic systolic (congestive) heart failure: Secondary | ICD-10-CM | POA: Diagnosis not present

## 2022-03-26 DIAGNOSIS — E1169 Type 2 diabetes mellitus with other specified complication: Secondary | ICD-10-CM | POA: Diagnosis not present

## 2022-03-26 DIAGNOSIS — N186 End stage renal disease: Secondary | ICD-10-CM | POA: Diagnosis not present

## 2022-03-26 LAB — HEMOGLOBIN AND HEMATOCRIT, BLOOD
HCT: 22.9 % — ABNORMAL LOW (ref 36.0–46.0)
Hemoglobin: 6.9 g/dL — CL (ref 12.0–15.0)

## 2022-03-26 MED ORDER — HEPARIN SODIUM (PORCINE) 1000 UNIT/ML IJ SOLN
2100.0000 [IU] | Freq: Four times a day (QID) | INTRAMUSCULAR | Status: AC
  Administered 2022-03-26: 2100 [IU]

## 2022-03-26 MED ORDER — OXYCODONE HCL 5 MG PO TABS
2.5000 mg | ORAL_TABLET | ORAL | Status: DC | PRN
  Administered 2022-03-26 (×2): 2.5 mg via ORAL
  Administered 2022-03-27 (×2): 5 mg via ORAL
  Administered 2022-03-28 (×3): 2.5 mg via ORAL
  Filled 2022-03-26 (×7): qty 1

## 2022-03-26 NOTE — Progress Notes (Signed)
Spoke with Anice Paganini, PA-C, nephrology.  Plans to transfuse 1 unit of blood during hemodialysis tomorrow 03/27/2022 via HD catheter.  Pt's vitals are stable and patient is asymptomatic for hgb of 6.9 today. Will continue to monitor.

## 2022-03-26 NOTE — Progress Notes (Signed)
PROGRESS NOTE    Courtney Gray  JJK:[id number] DOB: 01-14-1971 DOA: 11/01/2021 PCP: Monico Blitz, MD   Brief Narrative: Courtney Gray is a 51 y.o. female with a history of ESRD on HD, chronic heart failure, PAD, bilateral AKA, diabetes mellitus, morbid obesity, pressure injury. Patient presented secondary to shortness of breath which was consistent with acute heart failure that responded to hemodialysis. Discharge is pending ability to obtain outpatient hemodialysis that can accommodate patient's mobility constraints secondary to decubitus ulcer wound.  Assessment and Plan:  Acute on chronic diastolic heart failure Present on admission with associated pulmonary edema. Treated with hemodialysis. Acute process resolved. -Continue fluid management with hemodialysis.  Chronic respiratory failure Stable.  Acute pulmonary edema Secondary to acute heart failure and managed with hemodialysis.  Hypertensive emergency BP of 181/81 mmHg with associated acute heart failure on admission. Started on Imdur and managed with hemodialysis. Resolved.  ESRD on HD Nephrology on board for hemodialysis  Anemia of chronic kidney disease Hemoglobin drifted down to 6.9 on 12/19; plan for blood transfusion but that did not occur. Patient is asymptomatic. Hemoglobin stable at 6.9 today. -Plan for blood transfusion tomorrow with HD  Diabetes mellitus type 2 Well controlled on insulin. Hemoglobin A1C of 5.7%. 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 -Continue aspirin  Hypomagnesemia Supplementation given.  Leukocytosis Mild and stable.   Irritable bowel syndrome with constipation -Continue Lactulose PRN  History of septic shock secondary to bacteroides bacteremia with liver and splenic abscesses Diagnosed on prior hospitalization. Patient completed antibiotic course this admission.  Abdominal drain removed by IR on 8/9.  Morbid obesity Body mass index is 44.6 kg/m.  Constipation -Lactulose PRN  Pressure injury Right buttocks/right hip present on admission. Left/lower flank, not present on admission. -Wound care recommendations (11/28): Wash the wounds on the right hip area and LLQ of the abdomen with saline, pat dry. Place xeroform and gauze to the areas. Change daily.    DVT prophylaxis: heparin subq Code Status:   Code Status: Full Code Family Communication: None at bedside Disposition Plan: Discharge to SNF pending ability to receive outpatient HD. Barrier includes not being able to sit up for hemodialysis secondary to pressure injury wound   Consultants:  Nephrology Interventional radiology Infectious disease Vascular surgery  Procedures:  Exchange of left IJ tunneled HD cath (8/22) Right upper extremity venogram/Central venogram (8/30)  Antimicrobials: Vancomycin Cefepime Flagyl    Subjective: Patient with concerns from yesterday that she did not get her hemodialysis. She reports getting into her mobile chair and traveling to the [employer]	[place], which she enjoyed. She also reports that her bed is not comfortable and applies pressure to her body at the bend. She reports having some increased somnolence even with reduction of oxycodone.  Objective: BP (!) 148/70   Pulse 72   Temp 98.2 F (36.8 C) (Oral)   Resp 18   Ht 5\' 5"  (1.651 m)   Wt 121.6 kg   SpO2 100%   BMI 44.60 kg/m   Examination:  General exam: Appears calm and comfortable Respiratory system: Respiratory effort normal. Central nervous system: Alert and oriented. Musculoskeletal: B/L AKA Psychiatry: Judgement and insight appear normal. Mood & affect appropriate.    Data Reviewed: I have personally reviewed following labs and imaging studies  CBC Lab Results  Component Value Date   WBC 8.8 03/25/2022   RBC 2.40 (L) 03/25/2022   HGB 6.9 (  LL) 03/25/2022   HCT 23.3 (L)  03/25/2022   MCV 97.1 03/25/2022   MCH 28.8 03/25/2022   PLT 252 03/25/2022   MCHC 29.6 (L) 03/25/2022   RDW 16.0 (H) 03/25/2022   LYMPHSABS 1.2 01/21/2022   MONOABS 0.8 01/21/2022   EOSABS 0.5 01/21/2022   BASOSABS 0.1 76/28/3151     Last metabolic panel Lab Results  Component Value Date   NA 138 03/25/2022   NA 139 03/25/2022   K 4.9 03/25/2022   K 4.9 03/25/2022   CL 98 03/25/2022   CL 98 03/25/2022   CO2 26 03/25/2022   CO2 25 03/25/2022   BUN 38 (H) 03/25/2022   BUN 38 (H) 03/25/2022   CREATININE 9.76 (H) 03/25/2022   CREATININE 9.69 (H) 03/25/2022   GLUCOSE 106 (H) 03/25/2022   GLUCOSE 116 (H) 03/25/2022   GFRNONAA 4 (L) 03/25/2022   GFRNONAA 4 (L) 03/25/2022   GFRAA 6 (L) 11/29/2019   CALCIUM 9.3 03/25/2022   CALCIUM 9.3 03/25/2022   PHOS 5.1 (H) 03/25/2022   PROT 8.3 (H) 02/27/2022   ALBUMIN 2.6 (L) 03/25/2022   BILITOT 0.3 02/27/2022   ALKPHOS 66 02/27/2022   AST 17 02/27/2022   ALT 11 02/27/2022   ANIONGAP 14 03/25/2022   ANIONGAP 16 (H) 03/25/2022    GFR: Estimated Creatinine Clearance: 8.9 mL/min (A) (by C-G formula based on SCr of 9.76 mg/dL (H)).  No results found for this or any previous visit (from the past 240 hour(s)).    Radiology Studies: No results found.    LOS: 145 days    Cordelia Poche, MD [facility] 03/26/2022, 9:52 AM   If 7PM-7AM, please contact night-coverage [email or web address]

## 2022-03-26 NOTE — Progress Notes (Signed)
[facility] Progress Note   Subjective:   Hgb 6.9 but unable to give PRBC yesterday due to circuit clotting. IV team was not able to get IV access last night. Pt is upset about these events, says IV attempts were very painful. Denies SOB, CP, dizziness and nausea. Discussed with attending MD, if hgb is fairly stable today will plan to give PRBC tomorrow in dialysis.   Objective Vitals:   03/25/22 1753 03/25/22 2021 03/26/22 0601 03/26/22 0907  BP: (!) 169/63 (!) 124/96 (!) 155/55 (!) 148/70  Pulse: 66 69 70 72  Resp:  _0 Temp:  98.1 F (36.7 C) 98.2 F (36.8 C) 98.2 F (36.8 C)  TempSrc:  Oral Oral Oral  SpO2:  99% 100% 100%  Weight:      Height:       Physical Exam General: Obese female in NAD Heart: RRR, 2/6 systolic murmur Lungs: Lungs CTA bilaterally Abdomen: Soft, non-distended. +BS Extremities: b/l AKAs, no edema Dialysis Access:  [facility] with intact dressing  Additional Objective Labs: Basic Metabolic Panel: Recent Labs  Lab 03/20/22 0010 03/25/22 1419  NA 141 139  138  K 4.9 4.9  4.9  CL 102 98  98  CO2 _1 GLUCOSE 97 116*  106*  BUN 43* 38*  38*  CREATININE 10.46* 9.69*  9.76*  CALCIUM 9.0 9.3  9.3  PHOS  --  5.1*   Liver Function Tests: Recent Labs  Lab 03/25/22 1419  ALBUMIN 2.6*   No results for input(s): "LIPASE", "AMYLASE" in the last 168 hours. CBC: Recent Labs  Lab 03/20/22 0010 03/25/22 1419  WBC 10.9* 8.8  HGB 7.0* 6.9*  HCT 23.1* 23.3*  MCV 95.9 97.1  PLT 244 252   Blood Culture    Component Value Date/Time   SDES BLOOD LEFT ANTECUBITAL 10/16/2021 1534   SPECREQUEST  10/16/2021 1534    BOTTLES DRAWN AEROBIC AND ANAEROBIC Blood Culture results may not be optimal due to an inadequate volume of blood received in culture bottles   CULT  10/16/2021 1534    NO GROWTH 5 DAYS Performed at [facility], [place] [address] [place], [place] [address]    REPTSTATUS 10/21/2021 FINAL 10/16/2021 1534     Cardiac Enzymes: No results for input(s): "CKTOTAL", "CKMB", "CKMBINDEX", "TROPONINI" in the last 168 hours. CBG: No results for input(s): "GLUCAP" in the last 168 hours. Iron Studies: No results for input(s): "IRON", "TIBC", "TRANSFERRIN", "FERRITIN" in the last 72 hours. _2 @ Studies/Results: No results found. Medications:  sodium thiosulfate 12.5 g in sodium chloride 0.9 % 150 mL Infusion for Calciphylaxis Stopped (03/25/22 1749)    sodium chloride   Intravenous Once   sodium chloride   Intravenous Once   aspirin EC  81 mg Oral Daily   atorvastatin  10 mg Oral Daily   Chlorhexidine Gluconate Cloth  6 each Topical Q0600   darbepoetin (ARANESP) injection - DIALYSIS  200 mcg Subcutaneous Q Tue-1800   docusate sodium  100 mg Oral BID   famotidine  20 mg Oral Daily   feeding supplement  1 Container Oral TID BM   gabapentin  100 mg Oral Q1200   gabapentin  200 mg Oral QHS   Gerhardt's butt cream   Topical BID   hydroquinone   Topical BID   levothyroxine  100 mcg Oral Q0600   lidocaine  2 patch Transdermal Q24H   lidocaine-prilocaine   Topical Once   midodrine  10  mg Oral Once per day on Tue Thu Sat   pantoprazole  40 mg Oral Daily   sevelamer carbonate  4.8 g Oral TID WC   sucroferric oxyhydroxide  500 mg Oral TID WC    Assessment/Plan: Inability to obtain HD at OP center D/T body habitus and wound: Difficult situation with no clear answers yet. PT working with pt -  met with seating specialist to get evaluated for custom chair/cushion that will allow her sit for longer periods.  Appreciate efforts from PT team.  ESRD: Usual TTS schedule - was refusing HD at times but is now back on schedule, next HD tomorrow HD access: new TDC placed 02/04/2022, TDC stopped working again 11/30. IR consulted - S/p venogram, angioplasty of SVC and innominate veins, and TDC exchange.  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 Started empirically on sodium thiosulfate, but caused vomiting so stopped - now getting 1/2 dose - 12.52m q HD, refuses increased dose. Not on any calcium based products or vitamin D. Needs better control of Phos - have added Velphoro as well. HypoTN/volume: On midodrine 141mqHD, with 1074mrn mid HD w/parameters to keep SBP >100 d/t becoming symptomatic when it drops lower. Significant weight loss this admit. Severe ischial sacral decubitus wound: CT showing chronic osteomyelitis. Limiting her, noted to be slow healing. Goal to tolerate sitting upright - which she is doing with PT.  ID started doxycycline which caused diarrhea - now off.  COPD/Chronic O2 use: Prior O2 at home (5L/min) -> tapered to 2-3L/min here Anemia of ESRD: on max dose Aranesp 200m68meekly. Recent Fe load completed. Hgb drifted down to 6.9 yesterday, unable to give PRBC yesterday due to HD system clotting/unable to obtain IV access. Pt is asymptomatic. If Hgb is stable today, will plan to give PRBC with HD tomorrow to avoid further IV access attempts Secondary hyperparathyroidism: Phos 5.1-improving. On two binders. CorrCa elevated. Not on VDRA, last PTH 86 (low) on 03/11/22. Avoid all calcium based medications for Vitamin D d/t concern for calciphylaxis.  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 Nutrition - diet continues to be changed from renal.  Currently on regular diet.  Would really prefer she be on a renal diet but patient has been refusing Disposition: Difficult situation. Prior to admit, patient required 6 staff members and 2 hoyer pads for transfer into HD chair. Could not stay on HD for full tx. 2/2 pain of multiple decubitus ulcers and PTAR unable to lift pt into ambulance for transport.SW working on LTC at SNF.Con-wayll need to tolerate 5-6hrs in a chair to tolerate OP dialysis including transport, treatment and cannulation time.  The main issue is her Rt ischial wound.  PT has arranged seating specialist with plan for custom power chair/cushion that would keep pressure off wounds and allow her to sit for longer periods     SamaAnice Paganini-C 03/26/2022, 9:40 AM  [facility] Pager: [phone]

## 2022-03-26 NOTE — Progress Notes (Signed)
Received orders by Dr Otelia Santee and acknowledged by RN that IV team can draw labs from HD catheter.

## 2022-03-26 NOTE — Progress Notes (Signed)
Pt refused dressing changes all day.  Pt stated I didn't sleep last night and want to sleep today.

## 2022-03-27 DIAGNOSIS — E1169 Type 2 diabetes mellitus with other specified complication: Secondary | ICD-10-CM | POA: Diagnosis not present

## 2022-03-27 DIAGNOSIS — I5023 Acute on chronic systolic (congestive) heart failure: Secondary | ICD-10-CM | POA: Diagnosis not present

## 2022-03-27 DIAGNOSIS — I5033 Acute on chronic diastolic (congestive) heart failure: Secondary | ICD-10-CM | POA: Diagnosis not present

## 2022-03-27 DIAGNOSIS — N186 End stage renal disease: Secondary | ICD-10-CM | POA: Diagnosis not present

## 2022-03-27 NOTE — Progress Notes (Signed)
Pt. Refused wound dressing by stating it is not soiled and not draining bed,regardless the education given.

## 2022-03-27 NOTE — Progress Notes (Signed)
PROGRESS NOTE    [name]  EQA:[id number] DOB: 1971/03/07 DOA: 11/01/2021 PCP: Monico Blitz, MD   Brief Narrative: Courtney Gray is a 51 y.o. female with a history of ESRD on HD, chronic heart failure, PAD, bilateral AKA, diabetes mellitus, morbid obesity, pressure injury. Patient presented secondary to shortness of breath which was consistent with acute heart failure that responded to hemodialysis. Discharge is pending ability to obtain outpatient hemodialysis that can accommodate patient's mobility constraints secondary to decubitus ulcer wound and bilateral AKAs.  Assessment and Plan:  Acute on chronic diastolic heart failure Present on admission with associated pulmonary edema. Treated with hemodialysis. Acute process resolved. -Continue fluid management with hemodialysis.  Chronic respiratory failure Stable.  Acute pulmonary edema Secondary to acute heart failure and managed with hemodialysis.  Hypertensive emergency BP of 181/81 mmHg with associated acute heart failure on admission. Started on Imdur and managed with hemodialysis. Resolved.  ESRD on HD Nephrology on board for hemodialysis  Anemia of chronic kidney disease Hemoglobin drifted down to 6.9 on 12/19; plan for blood transfusion but that did not occur. Patient is asymptomatic. Hemoglobin stable at 6.9. No hemorrhaging noted. -Plan for blood transfusion today with HD  Diabetes mellitus type 2 Well controlled on insulin. Hemoglobin A1C of 5.7%. 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 -Continue aspirin  Hypomagnesemia Supplementation given.  Leukocytosis Mild and stable.   Irritable bowel syndrome with constipation -Continue Lactulose PRN  History of septic shock secondary to bacteroides bacteremia with liver and splenic abscesses Diagnosed on prior hospitalization. Patient completed  antibiotic course this admission. Abdominal drain removed by IR on 8/9.  Morbid obesity Body mass index is 44.6 kg/m.  Constipation -Lactulose PRN  Pressure injury Right buttocks/right hip present on admission. Left/lower flank, not present on admission. -Wound care recommendations (11/28): Wash the wounds on the right hip area and LLQ of the abdomen with saline, pat dry. Place xeroform and gauze to the areas. Change daily.    DVT prophylaxis: heparin subq Code Status:   Code Status: Full Code Family Communication: None at bedside Disposition Plan: Discharge to SNF pending ability to receive outpatient HD. Barrier includes not being able to sit up for hemodialysis secondary to pressure injury wound   Consultants:  Nephrology Interventional radiology Infectious disease Vascular surgery  Procedures:  Exchange of left IJ tunneled HD cath (8/22) Right upper extremity venogram/Central venogram (8/30)  Antimicrobials: Vancomycin Cefepime Flagyl    Subjective: Patient with continued concerns about her bed being uncomfortable. No bowel movement yet. Anticipating transfusion with hemodialysis.  Objective: BP (!) 141/45   Pulse 73   Temp 98.4 F (36.9 C) (Oral)   Resp 19   Ht 5\' 5"  (1.651 m)   Wt 121.6 kg   SpO2 98%   BMI 44.60 kg/m   Examination:  General exam: Appears calm and comfortable Respiratory system: Respiratory effort normal. Central nervous system: Alert and oriented. Musculoskeletal: Bilateral AKA Psychiatry: Judgement and insight appear normal. Mood & affect appropriate.    Data Reviewed: I have personally reviewed following labs and imaging studies  CBC Lab Results  Component Value Date   WBC 8.8 03/25/2022   RBC 2.40 (L) 03/25/2022   HGB 6.9 (LL) 03/26/2022   HCT 22.9 (L) 03/26/2022   MCV 97.1 03/25/2022   MCH 28.8 03/25/2022   PLT 252 03/25/2022   MCHC 29.6 (L) 03/25/2022   RDW 16.0 (H) 03/25/2022  LYMPHSABS 1.2 01/21/2022   MONOABS  0.8 01/21/2022   EOSABS 0.5 01/21/2022   BASOSABS 0.1 66/29/4765     Last metabolic panel Lab Results  Component Value Date   NA 138 03/25/2022   NA 139 03/25/2022   K 4.9 03/25/2022   K 4.9 03/25/2022   CL 98 03/25/2022   CL 98 03/25/2022   CO2 26 03/25/2022   CO2 25 03/25/2022   BUN 38 (H) 03/25/2022   BUN 38 (H) 03/25/2022   CREATININE 9.76 (H) 03/25/2022   CREATININE 9.69 (H) 03/25/2022   GLUCOSE 106 (H) 03/25/2022   GLUCOSE 116 (H) 03/25/2022   GFRNONAA 4 (L) 03/25/2022   GFRNONAA 4 (L) 03/25/2022   GFRAA 6 (L) 11/29/2019   CALCIUM 9.3 03/25/2022   CALCIUM 9.3 03/25/2022   PHOS 5.1 (H) 03/25/2022   PROT 8.3 (H) 02/27/2022   ALBUMIN 2.6 (L) 03/25/2022   BILITOT 0.3 02/27/2022   ALKPHOS 66 02/27/2022   AST 17 02/27/2022   ALT 11 02/27/2022   ANIONGAP 14 03/25/2022   ANIONGAP 16 (H) 03/25/2022    GFR: Estimated Creatinine Clearance: 8.9 mL/min (A) (by C-G formula based on SCr of 9.76 mg/dL (H)).  No results found for this or any previous visit (from the past 240 hour(s)).    Radiology Studies: No results found.    LOS: 146 days    Cordelia Poche, MD [facility] 03/27/2022, 12:15 PM   If 7PM-7AM, please contact night-coverage [email or web address]

## 2022-03-27 NOTE — Progress Notes (Signed)
PT Cancellation Note  Patient Details Name: Courtney Gray MRN: [id number] DOB: 08/31/1970   Cancelled Treatment:    Reason Eval/Treat Not Completed: Patient declined. Having dialysis today, requests follow-up tomorrow. Will make our best attempts to see Courtney Gray tomorrow.  Candie Mile, PT, DPT Physical Therapist Acute Rehabilitation Services [facility] 03/27/2022, 10:00 AM

## 2022-03-27 NOTE — Progress Notes (Signed)
[facility] Progress Note   Subjective:   Hgb 6.9 yesterday but pt without SOB,CP, palpitations or dizziness, BP stable. Plan for transfusion with HD today due to difficulty obtaining IV access. She c/o back pain due to air mattress, wants a regular bed. Concerns passed along to primary team.   Objective Vitals:   03/26/22 0907 03/26/22 2151 03/27/22 0656 03/27/22 0850  BP: (!) 148/70 (!) 166/73 (!) 144/72 (!) 141/45  Pulse: 72 78 70 73  Resp: _0 Temp: 98.2 F (36.8 C) 98.1 F (36.7 C) 98.3 F (36.8 C) 98.4 F (36.9 C)  TempSrc: Oral Oral Oral Oral  SpO2: 100% 100% 100% 98%  Weight:      Height:       Physical Exam General: Obese female in NAD Heart: RRR, 2/6 systolic murmur Lungs: Lungs CTA bilaterally Abdomen: Soft, non-distended. +BS Extremities: b/l AKAs, no edema Dialysis Access:  [facility] with intact dressing  Additional Objective Labs: Basic Metabolic Panel: Recent Labs  Lab 03/25/22 1419  NA 139  138  K 4.9  4.9  CL 98  98  CO2 25  26  GLUCOSE 116*  106*  BUN 38*  38*  CREATININE 9.69*  9.76*  CALCIUM 9.3  9.3  PHOS 5.1*   Liver Function Tests: Recent Labs  Lab 03/25/22 1419  ALBUMIN 2.6*   No results for input(s): "LIPASE", "AMYLASE" in the last 168 hours. CBC: Recent Labs  Lab 03/25/22 1419 03/26/22 1018  WBC 8.8  --   HGB 6.9* 6.9*  HCT 23.3* 22.9*  MCV 97.1  --   PLT 252  --    Blood Culture    Component Value Date/Time   SDES BLOOD LEFT ANTECUBITAL 10/16/2021 1534   SPECREQUEST  10/16/2021 1534    BOTTLES DRAWN AEROBIC AND ANAEROBIC Blood Culture results may not be optimal due to an inadequate volume of blood received in culture bottles   CULT  10/16/2021 1534    NO GROWTH 5 DAYS Performed at [facility], [place] [address], [place], [place] [address]    REPTSTATUS 10/21/2021 FINAL 10/16/2021 1534    Cardiac Enzymes: No results for input(s): "CKTOTAL", "CKMB", "CKMBINDEX", "TROPONINI" in the  last 168 hours. CBG: No results for input(s): "GLUCAP" in the last 168 hours. Iron Studies: No results for input(s): "IRON", "TIBC", "TRANSFERRIN", "FERRITIN" in the last 72 hours. _1 @ Studies/Results: No results found. Medications:  sodium thiosulfate 12.5 g in sodium chloride 0.9 % 150 mL Infusion for Calciphylaxis Stopped (03/25/22 1749)    sodium chloride   Intravenous Once   sodium chloride   Intravenous Once   aspirin EC  81 mg Oral Daily   atorvastatin  10 mg Oral Daily   Chlorhexidine Gluconate Cloth  6 each Topical Q0600   darbepoetin (ARANESP) injection - DIALYSIS  200 mcg Subcutaneous Q Tue-1800   docusate sodium  100 mg Oral BID   famotidine  20 mg Oral Daily   feeding supplement  1 Container Oral TID BM   gabapentin  100 mg Oral Q1200   gabapentin  200 mg Oral QHS   Gerhardt's butt cream   Topical BID   hydroquinone   Topical BID   levothyroxine  100 mcg Oral Q0600   lidocaine  2 patch Transdermal Q24H   lidocaine-prilocaine   Topical Once   midodrine  10 mg Oral Once per day on Tue Thu Sat   pantoprazole  40 mg Oral Daily   sevelamer carbonate  4.8 g Oral TID WC   sucroferric oxyhydroxide  500 mg Oral TID WC    Assessment/Plan: Inability to obtain HD at OP center D/T body habitus and wound: Difficult situation with no clear answers yet. PT working with pt -  met with seating specialist to get evaluated for custom chair/cushion that will allow her sit for longer periods.  Appreciate efforts from PT team.  ESRD: Usual TTS schedule - was refusing HD at times but is now back on schedule, next HD today HD access: new TDC placed 02/04/2022, TDC stopped working again 11/30. IR consulted - S/p venogram, angioplasty of SVC and innominate veins, and TDC exchange.  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 Started empirically on sodium thiosulfate, but caused vomiting so  stopped - now getting 1/2 dose - 12.59m q HD, refuses increased dose. Not on any calcium based products or vitamin D. Needs better control of Phos - have added Velphoro as well. HypoTN/volume: On midodrine 198mqHD, with 1037mrn mid HD w/parameters to keep SBP >100 d/t becoming symptomatic when it drops lower. Significant weight loss this admit. Severe ischial sacral decubitus wound: CT showing chronic osteomyelitis. Limiting her, noted to be slow healing. Goal to tolerate sitting upright - which she is doing with PT.  ID started doxycycline which caused diarrhea - now off.  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 Recent Fe load completed. Hgb drifted down to 6.9, unable to give PRBC yesterday due to HD system clotting/unable to obtain IV access. Pt is asymptomatic. Will plan to give PRBC with HD tomorrow to avoid further IV access attempts Secondary hyperparathyroidism: Phos 5.1-improving. On two binders. CorrCa elevated. Not on VDRA, last PTH 86 (low) on 03/11/22. Avoid all calcium based medications for Vitamin D d/t concern for calciphylaxis.  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 Nutrition - diet continues to be changed from renal.  Currently on regular diet.  Would really prefer she be on a renal diet but patient has been refusing Disposition: Difficult situation. Prior to admit, patient required 6 staff members and 2 hoyer pads for transfer into HD chair. Could not stay on HD for full tx. 2/2 pain of multiple decubitus ulcers and PTAR unable to lift pt into ambulance for transport.SW working on LTC at SNF.Con-wayll need to tolerate 5-6hrs in a chair to tolerate OP dialysis including transport, treatment and cannulation time.  The main issue is her Rt ischial wound. PT has arranged seating specialist with plan for custom power chair/cushion that would keep pressure off wounds and allow her to sit  for longer periods     SamaAnice Paganini-C 03/27/2022, 11:17 AM  [facility] Pager: [phone]

## 2022-03-27 NOTE — Progress Notes (Signed)
Contacted local [employer] to inquire if [employer] allows pts to receive HD in specialty w/c for medical reasons. Advised that pt's must receive HD in clinic HD chair.   Melven Sartorius Renal Navigator [phone]

## 2022-03-27 NOTE — TOC Progression Note (Signed)
Transition of Care [facility]) - Initial/Assessment Note    Patient Details  Name: Courtney Gray MRN: [id number] Date of Birth: 09-Nov-1970  [facility]) CM/SW Contact:    Milinda Antis, [place] Phone Number: 03/27/2022, 11:10 AM  Clinical Narrative:                 LCSW spoke with Sarah at at the agency where physical therapy received the demo motorized wheel chair to inquire about insurance coverage.  LCSW was informed that because the patient has Medicare and secondary insurance, Medicaid, the cost of the wheelchair should be covered pending there is adequate documentation to support the need for wheelchair.  LCSW inquired about the patient discharging to a SNF and was informed that insurance will not cover if she is discharged to a SNF.    TOC following.  Planned Disposition: Skilled Nursing Facility Barriers to Discharge: No SNF bed, Transportation, Waiting for outpatient dialysis   Patient Goals and CMS Choice Patient states their goals for this hospitalization and ongoing recovery are:: To be at a facility close to her daughter          Expected Discharge Plan and Services Planned Disposition: [place] arrangements for the past 2 months: Single Family Home                                      Prior Living Arrangements/Services Living arrangements for the past 2 months: Single Family Home Lives with:: Adult Children Patient language and need for interpreter reviewed:: Yes        Need for Family Participation in Patient Care: Yes (Comment) Care giver support system in place?: No (comment)   Criminal Activity/Legal Involvement Pertinent to Current Situation/Hospitalization: No - Comment as needed  Activities of Daily Living Home Assistive Devices/Equipment: [employer] fast streamer ADL Screening (condition at time of admission) Patient's cognitive ability adequate to safely complete daily activities?: No Is the patient deaf or have  difficulty hearing?: No Does the patient have difficulty seeing, even when wearing glasses/contacts?: No Does the patient have difficulty concentrating, remembering, or making decisions?: No Patient able to express need for assistance with ADLs?: Yes Does the patient have difficulty dressing or bathing?: Yes Independently performs ADLs?: No Does the patient have difficulty walking or climbing stairs?: Yes Weakness of Legs: Both Weakness of Arms/Hands: Both  Permission Sought/Granted Permission sought to share information with : Other (comment) Permission granted to share information with : Yes, Verbal Permission Granted  Share Information with NAME: [place],antoinette (Daughter)   [phone]  Permission granted to share info w AGENCY: SNF        Emotional Assessment Appearance:: Appears older than stated age Attitude/Demeanor/Rapport: Engaged Affect (typically observed): Tearful/Crying, Accepting Orientation: : Oriented to Situation, Oriented to  Time, Oriented to Place, Oriented to Self Alcohol / Substance Use: Not Applicable Psych Involvement: No (comment)  Admission diagnosis:  End stage renal disease ([place]) [N18.6] Fluid overload [E87.70] Obesity with serious comorbidity, unspecified classification, unspecified obesity type [E66.9] Pressure injury of right buttock, stage 2 ([place]) [L89.312] Patient Active Problem List   Diagnosis Date Noted   Nodule of skin of abdomen 02/16/2022   Acute on chronic systolic CHF (congestive heart failure) ([place]) 02/11/2022   Acute on chronic diastolic CHF (congestive heart failure) ([place]) 12/12/2021   PAD (peripheral artery disease) ([place]) 12/12/2021   Irritable bowel syndrome (IBS)  12/12/2021   Amputation of left middle finger 12/12/2021   GERD (gastroesophageal reflux disease) 12/12/2021   Hypertension    Type 2 diabetes mellitus with hyperlipidemia (HCC)    Decubitus ulcer of sacral region, stage 3 (HCC)    Class 3 obesity (HCC)     Bacteremia    PVD (peripheral vascular disease) (HCC)    Hepatic abscess    Pressure injury of skin 09/13/2021   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[place])    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   Lactic acidosis 09/10/2021   Mixed diabetic hyperlipidemia associated with type 2 diabetes mellitus ([place]) 09/10/2021   Coronary artery disease involving native coronary artery of native heart without angina pectoris 09/10/2021   Elevated troponin level not due myocardial infarction 09/10/2021   Hypotension 09/10/2021   Dry gangrene (HCC)    Pain 08/03/2021   Ischemic foot pain at rest 08/03/2021   Hypoglycemia 07/10/2021   Volume overload 07/09/2021   Leukocytosis 06/11/2021   Acute gout 06/11/2021   Hyperkalemia 05/21/2021   Hemorrhoids    Hyperlipidemia 05/18/2021   Rectal bleeding 05/17/2021   Morbid obesity with BMI of 60.0-69.9, adult ([place]) 05/17/2021   NSTEMI (non-ST elevated myocardial infarction) ([place]) 05/16/2021   Essential hypertension 09/01/2020   End stage renal disease ([place]) 09/01/2020   Chronic respiratory failure with hypoxia ([place]) 09/01/2020   Hypertensive emergency    Hypertensive urgency 11/28/2019   Uncontrolled type 2 diabetes mellitus with hypoglycemia, with long-term current use of insulin ([place]) 11/28/2019   Anemia due to chronic kidney disease 11/28/2019   PCP:  Monico Blitz, MD Pharmacy:   [facility], [place] [address] [place] [address] Phone: [phone] Fax: [phone]  [facility] [address] [place] [address] Phone: [phone] Fax: [phone]  [place], [place] - [address] [place] [address] Phone: [phone] Fax: [phone]     Social Determinants of Health (SDOH) Social History: [place]: No Food Insecurity (12/11/2021)  Housing: Low Risk  (12/11/2021)  Transportation  Needs: No Transportation Needs (12/11/2021)  Utilities: At Risk (12/11/2021)  Tobacco Use: Low Risk  (03/07/2022)   SDOH Interventions:     Readmission Risk Interventions    09/16/2021    4:36 PM 08/28/2021   11:50 AM 07/12/2021   12:38 PM  Readmission Risk Prevention Plan  Transportation Screening Complete Complete Complete  Medication Review Press photographer)  Complete Complete  PCP or Specialist appointment within 3-5 days of discharge  Complete Complete  HRI or Home Care Consult Complete Complete Complete  SW Recovery Care/Counseling Consult Complete Complete Complete  Palliative Care Screening  Not Applicable Not Applicable  Skilled Nursing Facility Complete Not Applicable Complete

## 2022-03-27 NOTE — Plan of Care (Signed)

## 2022-03-28 DIAGNOSIS — N186 End stage renal disease: Secondary | ICD-10-CM | POA: Diagnosis not present

## 2022-03-28 DIAGNOSIS — I10 Essential (primary) hypertension: Secondary | ICD-10-CM | POA: Diagnosis not present

## 2022-03-28 DIAGNOSIS — I739 Peripheral vascular disease, unspecified: Secondary | ICD-10-CM | POA: Diagnosis not present

## 2022-03-28 DIAGNOSIS — I5023 Acute on chronic systolic (congestive) heart failure: Secondary | ICD-10-CM | POA: Diagnosis not present

## 2022-03-28 MED ORDER — LIDOCAINE 4 % EX CREA: TOPICAL_CREAM | Freq: Two times a day (BID) | CUTANEOUS | Status: DC | PRN

## 2022-03-28 MED ORDER — ONDANSETRON 4 MG PO TBDP
4.0000 mg | ORAL_TABLET | Freq: Four times a day (QID) | ORAL | Status: DC | PRN
  Administered 2022-03-28 – 2022-05-10 (×2): 4 mg via ORAL
  Filled 2022-03-28 (×2): qty 1

## 2022-03-28 MED ORDER — ONDANSETRON HCL 4 MG/2ML IJ SOLN: 4.0000 mg | Freq: Four times a day (QID) | INTRAMUSCULAR | Status: DC | PRN

## 2022-03-28 MED ORDER — HEPARIN SODIUM (PORCINE) 1000 UNIT/ML IJ SOLN
INTRAMUSCULAR | Status: AC
  Filled 2022-03-28: qty 5

## 2022-03-28 NOTE — Progress Notes (Signed)
PT Cancellation Note  Patient Details Name: Courtney Gray MRN: [id number] DOB: 1970/05/30   Cancelled Treatment:    Reason Eval/Treat Not Completed: Patient declined. States she returned from dialysis at [facility] and has not slept all day. Offered to come back late afternoon. Pt declined stating a return today would not offer her enough time to rest.  Candie Mile, PT, DPT Physical Therapist Acute Rehabilitation Services [place] 03/28/2022, 1:26 PM

## 2022-03-28 NOTE — Progress Notes (Signed)
Progress Note   Patient: Courtney Gray BWL:[id number] DOB: January 10, 1971 DOA: 11/01/2021     147 DOS: the patient was seen and examined on 03/28/2022   Brief hospital course: 51 year old F ESRD on HD TTS,  recent lengthy hospitalization for Bacteroides Thetaiotaomicron bacteremia, hepatic and splenic abscess --required amputation of third digit of left hand and underwent left AKA and had splenic drain as well as CT-guided drainage of liver abscess she had a line holiday on last admission on long-term IV antibiotics until 09/13/1155 diastolic CHF, PVD s/p bilateral AKA, IDDM-2, morbid obesity and  chronic sacral wound presenting with progressive shortness of breath in the setting of missed dialysis and admitted for hypertensive emergency  Per patient, she never missed her dialysis however according to outpatient HD records, it takes at least 4 staff members and [facility] lift for patient to be seated in the dialysis chair.    Symptoms improved with hemodialysis.  Patient underwent R UE venogram on 8/30.  Vascular surgery following for possible RUE HD access.    Patient is now waiting placement where she can get HD on stretcher that has been difficult to find.  Assessment and Plan: End stage renal disease [facility]) Patient will continue renal replacement therapy per nephrology recommendations.  Needs placement to continue with HD as outpatient.  Very deconditioned, large sacral decubitus wound.   Continue renal replacement therapy per nephrology recommendations.   Anemia of chronic kidney disease, Continue with EPO and Iron supplementation.  Follow up hgb down to 6,9, ordered PRBC transfusion to be given tomorrow during HD.   Metabolic bone disease, continue with sevelamer.   Hyperkalemia, hypomagnesemia, hypokalemia, hyponatremia, hypophosphatemia have been corrected with renal replacement therapy.   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 Empiric treatment with thiosulfate 11/18. Discontinued due to  persistent nausea and vomiting.  Patient complains of pain at the subcutaneous nodules  Continue pain control.  She has been able to use electric scooter yesterday with good toleration.   Hypertension Patient on midodrine on HD to prevent hypotension. Currently off antihypertensive medications.   Decubitus ulcer of sacral region, stage 3 (HCC) Deep wound at the sacrum, patient not able to seat.   Continue with oral oxycodone. Continue with local wound care.    Type 2 diabetes mellitus with hyperlipidemia (HCC) Her glucose has been stable and insulin therapy has been discontinued.   Statin therapy with atorvastatin.   Bacteremia Patient has completed antibiotic therapy. Bacteroides, splenic abscess.  Septic shock now resolved.  Abdominal drain removed on 11/13/21.   PVD (peripheral vascular disease) (HCC) Bilateral AKA.   Continue local wound care.   Now patient has an electric chair   Class 3 obesity (HCC) Calculated BMI is 42.6  Acute on chronic diastolic CHF (congestive heart failure) (HCC) Volume regulation with ultrafiltration in hemodialysis Continue midodrine for blood pressure support.   Nodule of skin of abdomen Noted to have areas of painful nodularity in her right flank as well as anterior left lower pannus.  These areas are mildly warm, but tender to palpation.  Tenderness has been present for 2 weeks, but she feels it is getting worse.  She has been afebrile and wbc count normal. CT abdomen did not show any underlying skin abscess. Discussed with Nephrology, Dr. 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 Recommended Gen surgery see for biopsy. I reached out to Saverio Danker, [place] with Gen Surgery, no biopsy recommended  Continue supportive medical care with oral analgesics and control of electrolytes through hemodialysis.  Subjective: Patient continue to have problems sleeping, no chest pain, she has been working with PT in  getting out of bed to her electric scooter.   Physical Exam: Vitals:   03/28/22 0204 03/28/22 0234 03/28/22 0451 03/28/22 0924  BP: (!) 159/73 (!) 150/67 131/67 (!) 156/93  Pulse: 66 68 70 71  Resp: (!) 21 (!) 22 18   Temp:  (P) 98.8 F (37.1 C) 98.6 F (37 C) 98.4 F (36.9 C)  TempSrc:  Oral Oral Oral  SpO2: 99% 97% 100% 97%  Weight:      Height:       Neurology awake and alert ENT with mild pallor Cardiovascular with S1 and S2 present and rhythmic Respiratory with no rales or wheezing Abdomen with no distention Bilateral BKA  Data Reviewed:    Family Communication: no family at the bedside   Disposition: Status is: Inpatient Remains inpatient appropriate because: pending placement, needs to seat for HD   Planned Discharge Destination: Home    Author: Tawni Millers, MD 03/28/2022 2:14 PM  For on call review [email or web address].

## 2022-03-28 NOTE — Progress Notes (Signed)
Received patient in bed to unit.  Alert and oriented.  Informed consent signed and in chart.   Treatment initiated: 2330 Treatment completed: 0204  Patient tolerated well.  Transported back to the room  Alert, without acute distress.  Hand-off given to patient's nurse.   Access used: catheter Access issues: none  Total UF removed: 2500 Medication(s) given: unit of blood an sodiun sulfate Post HD VS: 98.8 oral 150/67  Post HD weight: unable to obtain   [facility]

## 2022-03-28 NOTE — Progress Notes (Signed)
[facility] Progress Note   Subjective: Apparently very happy to see me. She always shares perceived improper performance of staff with me but no specific complaints. HD tomorrow.   Objective Vitals:   03/28/22 0204 03/28/22 0234 03/28/22 0451 03/28/22 0924  BP: (!) 159/73 (!) 150/67 131/67 (!) 156/93  Pulse: 66 68 70 71  Resp: (!) 21 (!) 22 18   Temp:  (P) 98.8 F (37.1 C) 98.6 F (37 C) 98.4 F (36.9 C)  TempSrc:  Oral Oral Oral  SpO2: 99% 97% 100% 97%  Weight:      Height:       Physical Exam General: Chronically ill appearing obese female in NAD Heart: S1,S2 2/6 SEM RRR Lungs: CTAB A/P Abdomen: obese NABS Extremities: Bilateral AKA no stump edema, no edema lower back or forearms.  Dialysis Access: RIJ TDC Drsg intact.    Additional Objective Labs: Basic Metabolic Panel: Recent Labs  Lab 03/25/22 1419  NA 139  138  K 4.9  4.9  CL 98  98  CO2 25  26  GLUCOSE 116*  106*  BUN 38*  38*  CREATININE 9.69*  9.76*  CALCIUM 9.3  9.3  PHOS 5.1*   Liver Function Tests: Recent Labs  Lab 03/25/22 1419  ALBUMIN 2.6*   No results for input(s): "LIPASE", "AMYLASE" in the last 168 hours. CBC: Recent Labs  Lab 03/25/22 1419 03/26/22 1018  WBC 8.8  --   HGB 6.9* 6.9*  HCT 23.3* 22.9*  MCV 97.1  --   PLT 252  --    Blood Culture    Component Value Date/Time   SDES BLOOD LEFT ANTECUBITAL 10/16/2021 1534   SPECREQUEST  10/16/2021 1534    BOTTLES DRAWN AEROBIC AND ANAEROBIC Blood Culture results may not be optimal due to an inadequate volume of blood received in culture bottles   CULT  10/16/2021 1534    NO GROWTH 5 DAYS Performed at [facility], [place] [address] [place], [place] [address]    REPTSTATUS 10/21/2021 FINAL 10/16/2021 1534    Cardiac Enzymes: No results for input(s): "CKTOTAL", "CKMB", "CKMBINDEX", "TROPONINI" in the last 168 hours. CBG: No results for input(s): "GLUCAP" in the last 168 hours. Iron Studies: No  results for input(s): "IRON", "TIBC", "TRANSFERRIN", "FERRITIN" in the last 72 hours. _0 @ Studies/Results: No results found. Medications:  sodium thiosulfate 12.5 g in sodium chloride 0.9 % 150 mL 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 (03/28/22 0202)    aspirin EC  81 mg Oral Daily   atorvastatin  10 mg Oral Daily   Chlorhexidine Gluconate Cloth  6 each Topical Q0600   darbepoetin (ARANESP) injection - DIALYSIS  200 mcg Subcutaneous Q Tue-1800   docusate sodium  100 mg Oral BID   famotidine  20 mg Oral Daily   feeding supplement  1 Container Oral TID BM   gabapentin  100 mg Oral Q1200   gabapentin  200 mg Oral QHS   Gerhardt's butt cream   Topical BID   heparin sodium (porcine)       hydroquinone   Topical BID   levothyroxine  100 mcg Oral Q0600   lidocaine  2 patch Transdermal Q24H   midodrine  10 mg Oral Once per day on Tue Thu Sat   pantoprazole  40 mg Oral Daily   sevelamer carbonate  4.8 g Oral TID WC   sucroferric oxyhydroxide  500 mg Oral TID WC       Assessment/Plan: Inability to obtain  HD at OP center D/T body habitus and wound: Difficult situation with no clear answers yet. PT working with pt -  met with seating specialist to get evaluated for custom chair/cushion that will allow her sit for longer periods.  Appreciate efforts from PT team.  ESRD: Usual TTS schedule - was refusing HD at times but is now back on schedule, next HD 03/29/2022 HD access: new TDC placed 02/04/2022, TDC stopped working again 11/30. IR consulted - S/p venogram, angioplasty of SVC and innominate veins, and TDC exchange. No recent issues reported.  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 Started empirically on sodium thiosulfate, but caused vomiting so stopped - now getting 1/2 dose - 12.3m q HD, refuses increased dose. Not on any calcium based products or vitamin D. Needs better control of Phos -  have added Velphoro as well. HypoTN/volume: On midodrine 126mqHD, with 1076mrn mid HD w/parameters to keep SBP >100 d/t becoming symptomatic when it drops lower. Significant weight loss this admit. Severe ischial sacral decubitus wound: CT showing chronic osteomyelitis. Limiting her, noted to be slow healing. Goal to tolerate sitting upright - which she is doing with PT.  ID started doxycycline which caused diarrhea - now off.  COPD/Chronic O2 use: Prior O2 at home (5L/min) -> tapered to 2-3L/min here Anemia of ESRD: on max dose Aranesp 200m81meekly. Recent Fe load completed. Hgb drifted down to 6.9, unable to give PRBC yesterday due to HD system clotting/unable to obtain IV access. Pt is asymptomatic. Will plan to give PRBC with HD tomorrow to avoid further IV access attempts Secondary hyperparathyroidism: Phos 5.1-improving. On two binders. CorrCa elevated. Not on VDRA, last PTH 86 (low) on 03/11/22. Avoid all calcium based medications for Vitamin D d/t concern for calciphylaxis.  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 Nutrition - diet continues to be changed from renal.  Currently on regular diet.  Would really prefer she be on a renal diet but patient has been refusing Disposition: Difficult situation. Prior to admit, patient required 6 staff members and 2 hoyer pads for transfer into HD chair. Could not stay on HD for full tx. 2/2 pain of multiple decubitus ulcers and PTAR unable to lift pt into ambulance for transport.SW working on LTC at SNF.Con-wayll need to tolerate 5-6hrs in a chair to tolerate OP dialysis including transport, treatment and cannulation time.  The main issue is her Rt ischial wound. PT has arranged seating specialist with plan for custom power chair/cushion that would keep pressure off wounds and allow her to sit for longer periods   Kebron Pulse H. Gurvir Schrom NP-C 03/28/2022, 10:44 AM  [employer]-[phone]\

## 2022-03-28 NOTE — Plan of Care (Signed)

## 2022-03-29 DIAGNOSIS — I5023 Acute on chronic systolic (congestive) heart failure: Secondary | ICD-10-CM | POA: Diagnosis not present

## 2022-03-29 DIAGNOSIS — J9611 Chronic respiratory failure with hypoxia: Secondary | ICD-10-CM | POA: Diagnosis not present

## 2022-03-29 DIAGNOSIS — N186 End stage renal disease: Secondary | ICD-10-CM | POA: Diagnosis not present

## 2022-03-29 DIAGNOSIS — I10 Essential (primary) hypertension: Secondary | ICD-10-CM | POA: Diagnosis not present

## 2022-03-29 LAB — CBC
HCT: 27.5 % — ABNORMAL LOW (ref 36.0–46.0)
Hemoglobin: 8.2 g/dL — ABNORMAL LOW (ref 12.0–15.0)
MCH: 28.2 pg (ref 26.0–34.0)
MCHC: 29.8 g/dL — ABNORMAL LOW (ref 30.0–36.0)
MCV: 94.5 fL (ref 80.0–100.0)
Platelets: 257 10*3/uL (ref 150–400)
RBC: 2.91 MIL/uL — ABNORMAL LOW (ref 3.87–5.11)
RDW: 17.4 % — ABNORMAL HIGH (ref 11.5–15.5)
WBC: 8 10*3/uL (ref 4.0–10.5)
nRBC: 0 % (ref 0.0–0.2)

## 2022-03-29 LAB — BPAM RBC
Blood Product Expiration Date: 202312292359
ISSUE DATE / TIME: 202312220044
Unit Type and Rh: 5100

## 2022-03-29 LAB — TYPE AND SCREEN
ABO/RH(D): O POS
Antibody Screen: NEGATIVE
Unit division: 0

## 2022-03-29 LAB — RENAL FUNCTION PANEL
Albumin: 2.9 g/dL — ABNORMAL LOW (ref 3.5–5.0)
Anion gap: 15 (ref 5–15)
BUN: 30 mg/dL — ABNORMAL HIGH (ref 6–20)
CO2: 26 mmol/L (ref 22–32)
Calcium: 9.4 mg/dL (ref 8.9–10.3)
Chloride: 96 mmol/L — ABNORMAL LOW (ref 98–111)
Creatinine, Ser: 7.44 mg/dL — ABNORMAL HIGH (ref 0.44–1.00)
GFR, Estimated: 6 mL/min — ABNORMAL LOW (ref >60)
Glucose, Bld: 68 mg/dL — ABNORMAL LOW (ref 70–99)
Phosphorus: 6.8 mg/dL — ABNORMAL HIGH (ref 2.5–4.6)
Potassium: 4.3 mmol/L (ref 3.5–5.1)
Sodium: 137 mmol/L (ref 135–145)

## 2022-03-29 MED ORDER — LIDOCAINE-PRILOCAINE 2.5-2.5 % EX CREA: 1.0000 | TOPICAL_CREAM | CUTANEOUS | Status: DC | PRN

## 2022-03-29 MED ORDER — HEPARIN SODIUM (PORCINE) 1000 UNIT/ML DIALYSIS: 1000.0000 [IU] | INTRAMUSCULAR | Status: DC | PRN

## 2022-03-29 MED ORDER — HEPARIN SODIUM (PORCINE) 1000 UNIT/ML IJ SOLN
INTRAMUSCULAR | Status: AC
  Filled 2022-03-29: qty 3

## 2022-03-29 MED ORDER — PENTAFLUOROPROP-TETRAFLUOROETH EX AERO: 1.0000 | INHALATION_SPRAY | CUTANEOUS | Status: DC | PRN

## 2022-03-29 MED ORDER — ANTICOAGULANT SODIUM CITRATE 4% (200MG/5ML) IV SOLN: 5.0000 mL | Status: DC | PRN

## 2022-03-29 MED ORDER — HEPARIN SODIUM (PORCINE) 1000 UNIT/ML IJ SOLN
INTRAMUSCULAR | Status: AC
  Filled 2022-03-29: qty 5

## 2022-03-29 MED ORDER — LIDOCAINE HCL (PF) 1 % IJ SOLN: 5.0000 mL | INTRAMUSCULAR | Status: DC | PRN

## 2022-03-29 MED ORDER — HEPARIN SODIUM (PORCINE) 1000 UNIT/ML DIALYSIS
20.0000 [IU]/kg | INTRAMUSCULAR | Status: DC | PRN
  Administered 2022-03-29: 2400 [IU] via INTRAVENOUS_CENTRAL

## 2022-03-29 MED ORDER — ALTEPLASE 2 MG IJ SOLR: 2.0000 mg | Freq: Once | INTRAMUSCULAR | Status: DC | PRN

## 2022-03-29 MED ORDER — OXYCODONE HCL 5 MG PO TABS
2.5000 mg | ORAL_TABLET | ORAL | Status: DC | PRN
  Administered 2022-03-30 – 2022-03-31 (×6): 2.5 mg via ORAL
  Filled 2022-03-29 (×6): qty 1

## 2022-03-29 NOTE — Progress Notes (Signed)
Received patient in bed before the start of the treatment,awake,alert  and oriented  x  3. Denies pain.Vitals stable  with oxygen at 3 lpm/[place]. Patient's bed wt scale is not working.  Access used  :Left HD catheter that works well.  Duration of treatment : 3.5 hours  Medicine given 12.5 G Sodium Thiosulfate.                            2,400 cc Heparin bolus.  Fluid removed :  2 liters.  Hemodialysis tx issue.:Though patient refused her Midodrine,she tolerated her treatment very well ,without dropping her blood pressure.

## 2022-03-29 NOTE — Progress Notes (Signed)
Progress Note   Patient: Courtney Gray HYI:[id number] DOB: 05/31/1970 DOA: 11/01/2021     148 DOS: the patient was seen and examined on 03/29/2022   Brief hospital course: 51 year old F ESRD on HD TTS,  recent lengthy hospitalization for Bacteroides Thetaiotaomicron bacteremia, hepatic and splenic abscess --required amputation of third digit of left hand and underwent left AKA and had splenic drain as well as CT-guided drainage of liver abscess she had a line holiday on last admission on long-term IV antibiotics until 10/12/6765 diastolic CHF, PVD s/p bilateral AKA, IDDM-2, morbid obesity and  chronic sacral wound presenting with progressive shortness of breath in the setting of missed dialysis and admitted for hypertensive emergency  Per patient, she never missed her dialysis however according to outpatient HD records, it takes at least 4 staff members and [facility] lift for patient to be seated in the dialysis chair.    Symptoms improved with hemodialysis.  Patient underwent R UE venogram on 8/30.  Vascular surgery following for possible RUE HD access.    Patient is now waiting placement where she can get HD on stretcher that has been difficult to find.  Assessment and Plan: End stage renal disease [facility]) Patient will continue renal replacement therapy per nephrology recommendations.  Needs placement to continue with HD as outpatient.  Very deconditioned, large sacral decubitus wound.   Continue renal replacement therapy per nephrology recommendations.   Anemia of chronic kidney disease, Continue with EPO and Iron supplementation.  Follow up hgb down to 6,9, ordered PRBC transfusion to be given tomorrow during HD.   Metabolic bone disease, continue with sevelamer.   Hyperkalemia, hypomagnesemia, hypokalemia, hyponatremia, hypophosphatemia have been corrected with renal replacement therapy.   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 Empiric treatment with thiosulfate 11/18. Discontinued due to  persistent nausea and vomiting.  Patient complains of pain at the subcutaneous nodules  Continue pain control.  She has been able to use electric scooter yesterday with good toleration.   Hypertension Patient on midodrine on HD to prevent hypotension. Currently off antihypertensive medications.   Decubitus ulcer of sacral region, stage 3 (HCC) Deep wound at the sacrum, patient not able to seat.   Continue with oral oxycodone. Continue with local wound care.    Type 2 diabetes mellitus with hyperlipidemia (HCC) Her glucose has been stable and insulin therapy has been discontinued.   Statin therapy with atorvastatin.   Bacteremia Patient has completed antibiotic therapy. Bacteroides, splenic abscess.  Septic shock now resolved.  Abdominal drain removed on 11/13/21.   PVD (peripheral vascular disease) (HCC) Bilateral AKA.   Continue local wound care.   Now patient has an electric chair   Class 3 obesity (HCC) Calculated BMI is 42.6  Acute on chronic diastolic CHF (congestive heart failure) (HCC) Volume regulation with ultrafiltration in hemodialysis Continue midodrine for blood pressure support.   Nodule of skin of abdomen Noted to have areas of painful nodularity in her right flank as well as anterior left lower pannus.  These areas are mildly warm, but tender to palpation.  Tenderness has been present for 2 weeks, but she feels it is getting worse.  She has been afebrile and wbc count normal. CT abdomen did not show any underlying skin abscess. Discussed with Nephrology, Dr. 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 Recommended Gen surgery see for biopsy. I reached out to Saverio Danker, [place] with Gen Surgery, no biopsy recommended  Continue supportive medical care with oral analgesics and control of electrolytes through hemodialysis.  Subjective: patient with no chest pain or dyspnea, reports poor oral intake and feeling week today.    Physical Exam: Vitals:   03/29/22 0440 03/29/22 0845 03/29/22 1230 03/29/22 1247  BP: (!) 172/59 (!) 170/79 (!) 154/74 (!) 166/86  Pulse: 72 70 67 63  Resp: 16 15 18 19   Temp: 98.5 F (36.9 C) 98 F (36.7 C) 98.2 F (36.8 C)   TempSrc: Oral Oral Oral   SpO2: 100% 99% 100% 97%  Weight:      Height:       Neurology somnolent but easy to arouse ENT With no pallor Cardiovascular with S1 and S2 present and rhythmic Respiratory with no rales or wheezing Abdomen with no distention  Bilateral BKA  Data Reviewed:    Family Communication: no family at the bedside   Disposition: Status is: Inpatient Remains inpatient appropriate because: pending placement.   Planned Discharge Destination: Home      Author: Tawni Millers, MD 03/29/2022 1:05 PM  For on call review [email or web address].

## 2022-03-29 NOTE — Progress Notes (Addendum)
[facility] Progress Note   Subjective: Seen in room. No issues to report overnight. Says she doesn't feel well, not eating for 3 days however no documentation of this behavior. Denies abdominal pain, N,V, D. Primary to see this AM. HD today on schedule.    Objective Vitals:   03/28/22 1653 03/28/22 2127 03/29/22 0440 03/29/22 0845  BP: (!) 133/92 (!) 175/78 (!) 172/59 (!) 170/79  Pulse: 75 73 72 70  Resp:  _0 Temp: 97.6 F (36.4 C) 98.2 F (36.8 C) 98.5 F (36.9 C) 98 F (36.7 C)  TempSrc: Oral Oral Oral Oral  SpO2: 100% 99% 100% 99%  Weight:      Height:       Physical Exam General: Chronically ill appearing obese female in NAD Heart: S1,S2 2/6 SEM RRR Lungs: CTAB A/P Abdomen: obese NABS Extremities: Bilateral AKA no stump edema, no edema lower back or forearms.  Dialysis Access: RIJ TDC Drsg intact.   Additional Objective Labs: Basic Metabolic Panel: Recent Labs  Lab 03/25/22 1419  NA 139  138  K 4.9  4.9  CL 98  98  CO2 25  26  GLUCOSE 116*  106*  BUN 38*  38*  CREATININE 9.69*  9.76*  CALCIUM 9.3  9.3  PHOS 5.1*   Liver Function Tests: Recent Labs  Lab 03/25/22 1419  ALBUMIN 2.6*   No results for input(s): "LIPASE", "AMYLASE" in the last 168 hours. CBC: Recent Labs  Lab 03/25/22 1419 03/26/22 1018  WBC 8.8  --   HGB 6.9* 6.9*  HCT 23.3* 22.9*  MCV 97.1  --   PLT 252  --    Blood Culture    Component Value Date/Time   SDES BLOOD LEFT ANTECUBITAL 10/16/2021 1534   SPECREQUEST  10/16/2021 1534    BOTTLES DRAWN AEROBIC AND ANAEROBIC Blood Culture results may not be optimal due to an inadequate volume of blood received in culture bottles   CULT  10/16/2021 1534    NO GROWTH 5 DAYS Performed at [facility], [place] [address] [place], [place] [address]    REPTSTATUS 10/21/2021 FINAL 10/16/2021 1534    Cardiac Enzymes: No results for input(s): "CKTOTAL", "CKMB", "CKMBINDEX", "TROPONINI" in the last 168  hours. CBG: No results for input(s): "GLUCAP" in the last 168 hours. Iron Studies: No results for input(s): "IRON", "TIBC", "TRANSFERRIN", "FERRITIN" in the last 72 hours. _1 @ Studies/Results: No results found. Medications:  sodium thiosulfate 12.5 g in sodium chloride 0.9 % 150 mL 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 (03/28/22 0202)    aspirin EC  81 mg Oral Daily   atorvastatin  10 mg Oral Daily   Chlorhexidine Gluconate Cloth  6 each Topical Q0600   darbepoetin (ARANESP) injection - DIALYSIS  200 mcg Subcutaneous Q Tue-1800   docusate sodium  100 mg Oral BID   famotidine  20 mg Oral Daily   feeding supplement  1 Container Oral TID BM   gabapentin  100 mg Oral Q1200   gabapentin  200 mg Oral QHS   Gerhardt's butt cream   Topical BID   hydroquinone   Topical BID   levothyroxine  100 mcg Oral Q0600   lidocaine  2 patch Transdermal Q24H   midodrine  10 mg Oral Once per day on Tue Thu Sat   pantoprazole  40 mg Oral Daily   sevelamer carbonate  4.8 g Oral TID WC   sucroferric oxyhydroxide  500 mg Oral TID WC  Assessment/Plan: Inability to obtain HD at OP center D/T body habitus and wound: Difficult situation with no clear answers yet. PT working with pt -  met with seating specialist to get evaluated for custom chair/cushion that will allow her sit for longer periods.  Appreciate efforts from PT team.  ESRD: Usual TTS schedule - was refusing HD at times but is now back on schedule, next HD 03/29/2022 HD access: new TDC placed 02/04/2022, TDC stopped working again 11/30. IR consulted - S/p venogram, angioplasty of SVC and innominate veins, and TDC exchange. No recent issues reported.  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 Started empirically on sodium thiosulfate, but caused vomiting so stopped - now getting 1/2 dose - 12.55m q HD, refuses increased dose. Not on any calcium based  products or vitamin D. Needs better control of Phos - have added Velphoro as well. HypoTN/volume: On midodrine 141mqHD, with 1035mrn mid HD w/parameters to keep SBP >100 d/t becoming symptomatic when it drops lower. Significant weight loss this admit. Severe ischial sacral decubitus wound: CT showing chronic osteomyelitis. Limiting her, noted to be slow healing. Goal to tolerate sitting upright - which she is doing with PT.  ID started doxycycline which caused diarrhea - now off.  COPD/Chronic O2 use: Prior O2 at home (5L/min) -> tapered to 2-3L/min here Anemia of ESRD: on max dose Aranesp 200m61meekly. Recent Fe load completed. Hgb drifted down to 6.9, unable to give PRBC yesterday due to HD system clotting/unable to obtain IV access. Pt is asymptomatic. After much drama, finally rec'd 1 unit of PBCs via TDC.[place] HGB.  Secondary hyperparathyroidism: Phos 5.1-improving. On two binders. CorrCa elevated. Not on VDRA, last PTH 86 (low) on 03/11/22. Avoid all calcium based medications for Vitamin D d/t concern for calciphylaxis.  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 Nutrition - diet continues to be changed from renal.  Currently on regular diet.  Would really prefer she be on a renal diet but patient has been refusing Disposition: Difficult situation. Prior to admit, patient required 6 staff members and 2 hoyer pads for transfer into HD chair. Could not stay on HD for full tx. 2/2 pain of multiple decubitus ulcers and PTAR unable to lift pt into ambulance for transport.SW working on LTC at SNF.Con-wayll need to tolerate 5-6hrs in a chair to tolerate OP dialysis including transport, treatment and cannulation time.  The main issue is her Rt ischial wound. PT has arranged seating specialist with plan for custom power chair/cushion that would keep pressure off wounds and allow her to sit for longer periods   Courtney Gray H. Andron Marrazzo NP-C 03/29/2022, 11:21 AM  [employer]-[phone]

## 2022-03-30 DIAGNOSIS — I10 Essential (primary) hypertension: Secondary | ICD-10-CM | POA: Diagnosis not present

## 2022-03-30 DIAGNOSIS — N186 End stage renal disease: Secondary | ICD-10-CM | POA: Diagnosis not present

## 2022-03-30 DIAGNOSIS — I5023 Acute on chronic systolic (congestive) heart failure: Secondary | ICD-10-CM | POA: Diagnosis not present

## 2022-03-30 DIAGNOSIS — J9611 Chronic respiratory failure with hypoxia: Secondary | ICD-10-CM | POA: Diagnosis not present

## 2022-03-30 MED ORDER — LIDOCAINE-PRILOCAINE 2.5-2.5 % EX CREA
TOPICAL_CREAM | Freq: Two times a day (BID) | CUTANEOUS | Status: DC | PRN
  Filled 2022-03-30: qty 5

## 2022-03-30 NOTE — Plan of Care (Signed)
  Problem: Health Behavior/Discharge Planning: Goal: Ability to manage health-related needs will improve Outcome: Progressing   Problem: Clinical Measurements: Goal: Ability to maintain clinical measurements within normal limits will improve Outcome: Progressing Goal: Will remain free from infection Outcome: Progressing Goal: Respiratory complications will improve Outcome: Progressing   Problem: Nutrition: Goal: Adequate nutrition will be maintained Outcome: Progressing

## 2022-03-30 NOTE — Progress Notes (Signed)
Courtney Gray Progress Note   Subjective: HD 03/29/2022 Net UF 2 liters. No C/Os.   Objective Vitals:   03/29/22 2130 03/29/22 2131 03/30/22 0627 03/30/22 0907  BP: 131/76  (!) 152/81 (!) 153/74  Pulse: 74  72 73  Resp: 18  17   Temp: 99 F (37.2 C) 98.6 F (37 C) 98.7 F (37.1 C) 98.3 F (36.8 C)  TempSrc: Oral Oral Oral Oral  SpO2: 98%  100% 100%  Height:       Physical Exam General: Chronically ill appearing obese female in NAD Heart: S1,S2 2/6 SEM RRR Lungs: CTAB A/P Abdomen: obese NABS Extremities: Bilateral AKA no stump edema, no edema lower back or forearms.  Dialysis Access: RIJ TDC Drsg intact.   Additional Objective Labs: Basic Metabolic Panel: Recent Labs  Lab 03/25/22 1419 03/29/22 1206  NA 139  138 137  K 4.9  4.9 4.3  CL 98  98 96*  CO2 _0 GLUCOSE 116*  106* 68*  BUN 38*  38* 30*  CREATININE 9.69*  9.76* 7.44*  CALCIUM 9.3  9.3 9.4  PHOS 5.1* 6.8*   Liver Function Tests: Recent Labs  Lab 03/25/22 1419 03/29/22 1206  ALBUMIN 2.6* 2.9*   No results for input(s): "LIPASE", "AMYLASE" in the last 168 hours. CBC: Recent Labs  Lab 03/25/22 1419 03/26/22 1018 03/29/22 1206  WBC 8.8  --  8.0  HGB 6.9* 6.9* 8.2*  HCT 23.3* 22.9* 27.5*  MCV 97.1  --  94.5  PLT 252  --  257   Blood Culture    Component Value Date/Time   SDES BLOOD LEFT ANTECUBITAL 10/16/2021 1534   SPECREQUEST  10/16/2021 1534    BOTTLES DRAWN AEROBIC AND ANAEROBIC Blood Culture results may not be optimal due to an inadequate volume of blood received in culture bottles   CULT  10/16/2021 1534    NO GROWTH 5 DAYS Performed at [facility], [place] [address], [place], [place] [address]    REPTSTATUS 10/21/2021 FINAL 10/16/2021 1534    Cardiac Enzymes: No results for input(s): "CKTOTAL", "CKMB", "CKMBINDEX", "TROPONINI" in the last 168 hours. CBG: No results for input(s): "GLUCAP" in the last 168 hours. Iron Studies: No results for  input(s): "IRON", "TIBC", "TRANSFERRIN", "FERRITIN" in the last 72 hours. _1 @ Studies/Results: No results found. Medications:  sodium thiosulfate 12.5 g in sodium chloride 0.9 % 150 mL 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 (03/29/22 1538)    aspirin EC  81 mg Oral Daily   atorvastatin  10 mg Oral Daily   Chlorhexidine Gluconate Cloth  6 each Topical Q0600   darbepoetin (ARANESP) injection - DIALYSIS  200 mcg Subcutaneous Q Tue-1800   docusate sodium  100 mg Oral BID   famotidine  20 mg Oral Daily   feeding supplement  1 Container Oral TID BM   gabapentin  100 mg Oral Q1200   gabapentin  200 mg Oral QHS   Gerhardt's butt cream   Topical BID   hydroquinone   Topical BID   levothyroxine  100 mcg Oral Q0600   midodrine  10 mg Oral Once per day on Tue Thu Sat   pantoprazole  40 mg Oral Daily   sevelamer carbonate  4.8 g Oral TID WC   sucroferric oxyhydroxide  500 mg Oral TID WC     Assessment/Plan: Inability to obtain HD at OP center D/T body habitus and wound: Difficult situation with no clear answers yet. PT working with pt -  met with seating specialist to get evaluated for custom chair/cushion that will allow her sit for longer periods.  Appreciate efforts from PT team.  ESRD: Usual TTS schedule - was refusing HD at times but is now back on schedule, next HD 04/01/2022 HD access: new TDC placed 02/04/2022, TDC stopped working again 11/30. IR consulted - S/p venogram, angioplasty of SVC and innominate veins, and TDC exchange. No recent issues reported.  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 Started empirically on sodium thiosulfate, but caused vomiting so stopped - now getting 1/2 dose - 12.86m q HD, refuses increased dose. Not on any calcium based products or vitamin D. Needs better control of Phos - have added Velphoro as well. HypoTN/volume: On midodrine 155mqHD, with 1046mrn mid  HD w/parameters to keep SBP >100 d/t becoming symptomatic when it drops lower. Significant weight loss this admit. Severe ischial sacral decubitus wound: CT showing chronic osteomyelitis. Limiting her, noted to be slow healing. Goal to tolerate sitting upright - which she is doing with PT.  ID started doxycycline which caused diarrhea - now off.  COPD/Chronic O2 use: Prior O2 at home (5L/min) -> tapered to 2-3L/min here Anemia of ESRD: on max dose Aranesp 200m63meekly. Recent Fe load completed. Hgb drifted down to 6.9, unable to give PRBC yesterday due to HD system clotting/unable to obtain IV access. Pt is asymptomatic. After much drama, finally rec'd 1 unit of PBCs via TDC.[place] HGB.  Secondary hyperparathyroidism: Phos 5.1-improving. On two binders. CorrCa elevated. Not on VDRA, last PTH 86 (low) on 03/11/22. Avoid all calcium based medications for Vitamin D d/t concern for calciphylaxis.  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 Nutrition - diet continues to be changed from renal.  Currently on regular diet.  Would really prefer she be on a renal diet but patient has been refusing Disposition: Difficult situation. Prior to admit, patient required 6 staff members and 2 hoyer pads for transfer into HD chair. Could not stay on HD for full tx. 2/2 pain of multiple decubitus ulcers and PTAR unable to lift pt into ambulance for transport.SW working on LTC at SNF.Con-wayll need to tolerate 5-6hrs in a chair to tolerate OP dialysis including transport, treatment and cannulation time.  The main issue is her Rt ischial wound. PT has arranged seating specialist with plan for custom power chair/cushion that would keep pressure off wounds and allow her to sit for longer periods   Pieper Kasik H. Courtney Mounsey NP-C 03/30/2022, 11:10 AM  [phone]

## 2022-03-30 NOTE — Progress Notes (Addendum)
Progress Note   Patient: Courtney Gray [id number] DOB: Jun 17, 1970 DOA: 11/01/2021     149 DOS: the patient was seen and examined on 03/30/2022   Brief hospital course: 51 year old F ESRD on HD TTS,  recent lengthy hospitalization for Bacteroides Thetaiotaomicron bacteremia, hepatic and splenic abscess --required amputation of third digit of left hand and underwent left AKA and had splenic drain as well as CT-guided drainage of liver abscess she had a line holiday on last admission on long-term IV antibiotics until 10/05/624 diastolic CHF, PVD s/p bilateral AKA, IDDM-2, morbid obesity and  chronic sacral wound presenting with progressive shortness of breath in the setting of missed dialysis and admitted for hypertensive emergency  Per patient, she never missed her dialysis however according to outpatient HD records, it takes at least 4 staff members and [facility] lift for patient to be seated in the dialysis chair.    Symptoms improved with hemodialysis.  Patient underwent R UE venogram on 8/30.  Vascular surgery following for possible RUE HD access.    Patient is now waiting placement where she can get HD on stretcher that has been difficult to find.  Assessment and Plan: End stage renal disease [facility]) Patient will continue renal replacement therapy per nephrology recommendations.  Needs placement to continue with HD as outpatient.  Very deconditioned, large sacral decubitus wound.   Continue renal replacement therapy per nephrology recommendations.   Anemia of chronic kidney disease, Continue with EPO and Iron supplementation.  Follow up hgb down to 6,9, improved to 8.2 after one unit PRBC transfusion.   Metabolic bone disease, continue with sevelamer.   Hyperkalemia, hypomagnesemia, hypokalemia, hyponatremia, hypophosphatemia have been corrected with renal replacement therapy.   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 Empiric treatment with thiosulfate 11/18. Discontinued due to  persistent nausea and vomiting.  Patient complains of pain at the subcutaneous nodules  Continue pain control.  She has been able to use electric scooter yesterday with good toleration.   Hypertension Patient on midodrine on HD to prevent hypotension. Currently off antihypertensive medications.   Decubitus ulcer of sacral region, stage 3 (HCC) Deep wound at the sacrum, patient not able to seat.   Continue with oral oxycodone dose has been decreased to 2,5 mg  Continue with local wound care.    Type 2 diabetes mellitus with hyperlipidemia (HCC) Her glucose has been stable and insulin therapy has been discontinued.   Statin therapy with atorvastatin.   Bacteremia Patient has completed antibiotic therapy. Bacteroides, splenic abscess.  Septic shock now resolved.  Abdominal drain removed on 11/13/21.   PVD (peripheral vascular disease) (HCC) Bilateral AKA.   Continue local wound care.   Now patient has an electric chair   Class 3 obesity (HCC) Calculated BMI is 42.6  Acute on chronic diastolic CHF (congestive heart failure) (HCC) Volume regulation with ultrafiltration in hemodialysis Continue midodrine for blood pressure support.   Nodule of skin of abdomen Noted to have areas of painful nodularity in her right flank as well as anterior left lower pannus.  These areas are mildly warm, but tender to palpation.  Tenderness has been present for 2 weeks, but she feels it is getting worse.  She has been afebrile and wbc count normal. CT abdomen did not show any underlying skin abscess. Discussed with Nephrology, Dr. 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 Recommended Gen surgery see for biopsy. I reached out to Saverio Danker, [place] with Gen Surgery, no biopsy recommended  Continue supportive medical care with oral analgesics and control  of electrolytes through hemodialysis.         Subjective: patient is feeling well today, no nausea or vomiting.    Physical Exam: Vitals:   03/29/22 2130 03/29/22 2131 03/30/22 0627 03/30/22 0907  BP: 131/76  (!) 152/81 (!) 153/74  Pulse: 74  72 73  Resp: 18  17   Temp: 99 F (37.2 C) 98.6 F (37 C) 98.7 F (37.1 C) 98.3 F (36.8 C)  TempSrc: Oral Oral Oral Oral  SpO2: 98%  100% 100%  Height:       Neurology awake and alert ENT with mild pallor Cardiovascular with S1 and S2 present and rhythmic Respiratory with no rales or wheezing Abdomen with no distention  Bilateral BKA Data Reviewed:    Family Communication: no family at the bedside   Disposition: Status is: Inpatient Remains inpatient appropriate because: pending placement   Planned Discharge Destination: Home     Author: Tawni Millers, MD 03/30/2022 1:04 PM  For on call review [email or web address].

## 2022-03-31 DIAGNOSIS — I10 Essential (primary) hypertension: Secondary | ICD-10-CM | POA: Diagnosis not present

## 2022-03-31 DIAGNOSIS — I5023 Acute on chronic systolic (congestive) heart failure: Secondary | ICD-10-CM | POA: Diagnosis not present

## 2022-03-31 DIAGNOSIS — N186 End stage renal disease: Secondary | ICD-10-CM | POA: Diagnosis not present

## 2022-03-31 MED ORDER — DULOXETINE HCL 20 MG PO CPEP
20.0000 mg | ORAL_CAPSULE | Freq: Every day | ORAL | Status: DC
  Filled 2022-03-31 (×2): qty 1

## 2022-03-31 MED ORDER — OXYCODONE HCL 5 MG PO TABS
5.0000 mg | ORAL_TABLET | ORAL | Status: DC | PRN
  Administered 2022-03-31 – 2022-05-14 (×179): 5 mg via ORAL
  Filled 2022-03-31 (×186): qty 1

## 2022-03-31 NOTE — Progress Notes (Signed)
Patient c/o pain and stated the 2.5 mg oxycodone is not working. Patient refused new order for cymbalta. MD aware.

## 2022-03-31 NOTE — Progress Notes (Addendum)
Progress Note   Patient: Courtney Gray [id number] DOB: Jun 22, 1970 DOA: 11/01/2021     150 DOS: the patient was seen and examined on 03/31/2022   Brief hospital course: 51 year old F ESRD on HD TTS,  recent lengthy hospitalization for Bacteroides Thetaiotaomicron bacteremia, hepatic and splenic abscess --required amputation of third digit of left hand and underwent left AKA and had splenic drain as well as CT-guided drainage of liver abscess she had a line holiday on last admission on long-term IV antibiotics until 0/08/3974 diastolic CHF, PVD s/p bilateral AKA, IDDM-2, morbid obesity and  chronic sacral wound presenting with progressive shortness of breath in the setting of missed dialysis and admitted for hypertensive emergency  Per patient, she never missed her dialysis however according to outpatient HD records, it takes at least 4 staff members and [facility] lift for patient to be seated in the dialysis chair.    Symptoms improved with hemodialysis.  Patient underwent R UE venogram on 8/30.  Vascular surgery following for possible RUE HD access.    Patient is now waiting placement where she can get HD on stretcher that has been difficult to find.  Assessment and Plan: End stage renal disease [facility]) Patient will continue renal replacement therapy per nephrology recommendations.  Needs placement to continue with HD as outpatient.  Very deconditioned, large sacral decubitus wound.   Continue renal replacement therapy per nephrology recommendations.   Anemia of chronic kidney disease, Continue with EPO and Iron supplementation.  Follow up hgb down to 6,9, improved to 8.2 after one unit PRBC transfusion.   Metabolic bone disease, continue with sevelamer.   Hyperkalemia, hypomagnesemia, hypokalemia, hyponatremia, hypophosphatemia have been corrected with renal replacement therapy.   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 Empiric treatment with thiosulfate 11/18. Discontinued due to  persistent nausea and vomiting.  Patient complains of pain at the subcutaneous nodules  Continue pain control.  She has been able to use electric scooter with good toleration.   Hypertension Patient on midodrine on HD to prevent hypotension. Currently off antihypertensive medications.   Decubitus ulcer of sacral region, stage 3 (HCC) Deep wound at the sacrum, patient not able to seat.   Continue with oral oxycodone dose has been decreased to 2,5 mg  Continue with local wound care. Continue with gabapentin. Will add duloxetine for chronic pain syndrome.     Type 2 diabetes mellitus with hyperlipidemia (HCC) Her glucose has been stable and insulin therapy has been discontinued.   Statin therapy with atorvastatin.   Bacteremia Patient has completed antibiotic therapy. Bacteroides, splenic abscess.  Septic shock now resolved.  Abdominal drain removed on 11/13/21.   PVD (peripheral vascular disease) (HCC) Bilateral AKA.   Continue local wound care.   Now patient has an electric chair   Class 3 obesity (HCC) Calculated BMI is 42.6  Acute on chronic diastolic CHF (congestive heart failure) (HCC) Volume regulation with ultrafiltration in hemodialysis Continue midodrine for blood pressure support.   Nodule of skin of abdomen Noted to have areas of painful nodularity in her right flank as well as anterior left lower pannus.  These areas are mildly warm, but tender to palpation.  Tenderness has been present for 2 weeks, but she feels it is getting worse.  She has been afebrile and wbc count normal. CT abdomen did not show any underlying skin abscess. Discussed with Nephrology, Dr. 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 Recommended Gen surgery see for biopsy. I reached out to Saverio Danker, [place] with Gen Surgery, no biopsy recommended  Continue supportive medical care with oral analgesics and control of electrolytes through hemodialysis.          Subjective: patient with intermittent abdominal pain, no nausea or vomiting.   Physical Exam: Vitals:   03/30/22 1638 03/30/22 2121 03/31/22 0507 03/31/22 0918  BP: (!) 155/41 (!) 150/61 (!) 159/85 (!) 155/75  Pulse: 65 64 64 63  Resp: 18 19 20 18   Temp: 98.5 F (36.9 C) 98.4 F (36.9 C) 97.7 F (36.5 C) 98 F (36.7 C)  TempSrc: Oral Oral Oral Oral  SpO2: 100% 97% 100% 100%  Height:       Neurology awake and alert ENT with no pallor Cardiovascular with S1 and S2 present and rhythmic Respiratory with no wheezing Abdomen with no distention  Bilateral BKA Positive sacrum decubitus ulcer dressing in place  Data Reviewed:    Family Communication: I spoke with patient's daughter at the bedside, we talked in detail about patient's condition, plan of care and prognosis and all questions were addressed.   Disposition: Status is: Inpatient Remains inpatient appropriate because: pending placement   Planned Discharge Destination: Home      Author: Tawni Millers, MD 03/31/2022 2:10 PM  For on call review [email or web address].

## 2022-04-01 DIAGNOSIS — N186 End stage renal disease: Secondary | ICD-10-CM | POA: Diagnosis not present

## 2022-04-01 DIAGNOSIS — I5023 Acute on chronic systolic (congestive) heart failure: Secondary | ICD-10-CM | POA: Diagnosis not present

## 2022-04-01 DIAGNOSIS — E1169 Type 2 diabetes mellitus with other specified complication: Secondary | ICD-10-CM | POA: Diagnosis not present

## 2022-04-01 DIAGNOSIS — L89153 Pressure ulcer of sacral region, stage 3: Secondary | ICD-10-CM | POA: Diagnosis not present

## 2022-04-01 MED ORDER — LACTULOSE 10 GM/15ML PO SOLN
30.0000 g | Freq: Two times a day (BID) | ORAL | Status: DC | PRN
  Administered 2022-04-02 – 2022-05-07 (×4): 30 g via ORAL
  Filled 2022-04-01 (×4): qty 45

## 2022-04-01 MED ORDER — ANTICOAGULANT SODIUM CITRATE 4% (200MG/5ML) IV SOLN: 5.0000 mL | Status: DC | PRN

## 2022-04-01 MED ORDER — ALTEPLASE 2 MG IJ SOLR: 2.0000 mg | Freq: Once | INTRAMUSCULAR | Status: DC | PRN

## 2022-04-01 MED ORDER — HEPARIN SODIUM (PORCINE) 1000 UNIT/ML DIALYSIS
1000.0000 [IU] | INTRAMUSCULAR | Status: DC | PRN
  Filled 2022-04-01: qty 1

## 2022-04-01 MED ORDER — HEPARIN SODIUM (PORCINE) 1000 UNIT/ML DIALYSIS: 20.0000 [IU]/kg | INTRAMUSCULAR | Status: DC | PRN

## 2022-04-01 NOTE — Plan of Care (Signed)

## 2022-04-01 NOTE — Consult Note (Signed)
[place] Nurse Consult Note: Reason for Consult: requested for updated wound care orders. Patient would not allow me to assess/change the abdominal and groin wounds, updated orders per her request Wound type: Stage 4 Pressure Injury; right  Pressure Injury POA: Yes Measurement: 4cm x 3cm x 5cm tunneled toward 12 o'clock   Wound bed: 100% clean and pink, granular, unable to see base of wound Drainage (amount, consistency, odor) heavy, yellow/grey, malodorous but this is not normally  Periwound: intact, epibole of the wound edges  Dressing procedure/placement/frequency: Continue silver hydrofiber for exudate management and for recalcitrant wound.   Discussed POC with patient and bedside nurse.  Re consult if needed, will not follow at this time. Thanks  Tailor Westfall [employer], RN,CWOCN, CNS, [place] [phone])

## 2022-04-01 NOTE — Progress Notes (Signed)
PROGRESS NOTE  Courtney Gray [id number] DOB: December 23, 1970   PCP: Monico Blitz, MD   DOA: 11/01/2021 LOS: 63  Chief complaints Chief Complaint  Patient presents with   Vascular Access Problem     Brief Narrative / Interim history: 51 year old F with PMH of ESRD on HD TTS, recent hospitalization for bacteremia, hepatic and splenic abscess on long-term IV antibiotics, diastolic CHF, PVD s/p bilateral AKA, IDDM-2, morbid obesity and chronic sacral wound presenting with progressive shortness of breath in the setting of missed dialysis and admitted for hypertensive emergency.  Per patient, she never missed her dialysis however according to outpatient HD records, it takes at least 4 staff members and [facility] lift for patient to be seated in the dialysis chair.  Symptoms improved with hemodialysis.  Patient underwent RUE and central venogram on 8/30.  Receiving HD through tunneled HD cath which was last exchanged on 03/07/2022. Patient has been waiting placement where she can get HD on stretcher that has been difficult to find.  Subjective: Seen and examined earlier this morning.  No major events overnight of this morning.  Patient does not want to take Cymbalta and that was started yesterday for pain.  She is also concerned about constipation from increased oxycodone.  Asking if lactulose dose can be increased.   Objective: Vitals:   03/31/22 1753 03/31/22 2101 04/01/22 0531 04/01/22 1001  BP: (!) 148/71 (!) 167/74 (!) 154/69 (!) 149/59  Pulse: 65 67 68 66  Resp: 19 18 18 18   Temp: 98.2 F (36.8 C) 98.2 F (36.8 C) 98.3 F (36.8 C) 98.1 F (36.7 C)  TempSrc: Oral Oral Oral Oral  SpO2: 100% 100% 100% 100%  Height:        Examination:  GENERAL: No apparent distress.  Nontoxic. HEENT: MMM.  Vision and hearing grossly intact.  NECK: Supple.  No apparent JVD.  RESP:  No IWOB.  Fair aeration bilaterally. CVS:  RRR. Heart sounds normal.  ABD/GI/GU: BS+. Abd soft, NTND.  MSK/EXT:  Bilateral AKA. SKIN: Unstageable right hip and stage II left flank pressure skin injury. NEURO: Awake, alert and oriented appropriately.  No apparent focal neuro deficit. PSYCH: Calm. Normal affect.    Assessment and plan: Principal Problem:   Acute on chronic systolic CHF (congestive heart failure) (HCC) Active Problems:   End stage renal disease (HCC)   Hypertension   Decubitus ulcer of sacral region, stage 3 (HCC)   Type 2 diabetes mellitus with hyperlipidemia (HCC)   Chronic respiratory failure with hypoxia (HCC)   Bacteremia   PVD (peripheral vascular disease) (HCC)   Class 3 obesity (HCC)   Anemia due to chronic kidney disease   Acute on chronic diastolic CHF (congestive heart failure) (HCC)   Hypertensive emergency   Leukocytosis   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HCC)   Pressure injury of skin   PAD (peripheral artery disease) (HCC)   Irritable bowel syndrome (IBS)   Amputation of left middle finger   GERD (gastroesophageal reflux disease)   Nodule of skin of abdomen  End stage renal disease on HD Electrolyte abnormality in the setting of ESRD Metabolic bone disease Anemia of renal disease -Nephrology managing. -Difficult to place since she requires HD in the stretcher.    Hypotension -Continue midodrine  Controlled NIDDM-2: A1c 5.3% on 12/19/2021 -Of subcu insulin. -Continue Lipitor   Septic shock/bacteremia: Resolved.  Completed treatment.  Abdominal drain removed on 11/13/2021   PVD s/p bilateral AKA -Continue local wound care.   -Now patient  has an electric chair    Acute on chronic diastolic CHF: Stable. 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  CT did not show abscess.  Surgery consulted and did not feel there was indication for I&D. -Empiric treatment with thiosulfate 11/18. -Pain control-discontinue Cymbalta at patient's request.  Continue oxycodone -Increase  lactulose from 20 to 30 g twice daily at patient's request.  Morbid obesity: Complicating mobility Body mass index is 44.6 kg/m.  Pressure skin injury Pressure Injury 11/12/21 Hip Right Unstageable - Full thickness tissue loss in which the base of the injury is covered by slough (yellow, tan, gray, green or brown) and/or eschar (tan, brown or black) in the wound bed. 90% covered with slough (Active)  11/12/21 0800  Location: Hip  Location Orientation: Right  Staging: Unstageable - Full thickness tissue loss in which the base of the injury is covered by slough (yellow, tan, gray, green or brown) and/or eschar (tan, brown or black) in the wound bed.  Wound Description (Comments): 90% covered with slough  Present on Admission: Yes (per RN)  Dressing Type Foam - Lift dressing to assess site every shift 03/31/22 2122     Pressure Injury 11/24/21 Flank Left;Lower Stage 2 -  Partial thickness loss of dermis presenting as a shallow open injury with a red, pink wound bed without slough. (Active)  11/24/21 0430  Location: Flank  Location Orientation: Left;Lower  Staging: Stage 2 -  Partial thickness loss of dermis presenting as a shallow open injury with a red, pink wound bed without slough.  Wound Description (Comments):   Present on Admission: No  Dressing Type Foam - Lift dressing to assess site every shift 03/29/22 0858   DVT prophylaxis:  None.  Code Status: Full code Family Communication: None at bedside Level of care: Med-Surg Status is: Inpatient Remains inpatient appropriate because: Placement   Final disposition: SNF Consultants:  Nephrology Vascular surgery  Sch Meds:  Scheduled Meds:  aspirin EC  81 mg Oral Daily   atorvastatin  10 mg Oral Daily   Chlorhexidine Gluconate Cloth  6 each Topical Q0600   darbepoetin (ARANESP) injection - DIALYSIS  200 mcg Subcutaneous Q Tue-1800   docusate sodium  100 mg Oral BID   famotidine  20 mg Oral Daily   feeding supplement  1  Container Oral TID BM   gabapentin  100 mg Oral Q1200   gabapentin  200 mg Oral QHS   Gerhardt's butt cream   Topical BID   hydroquinone   Topical BID   levothyroxine  100 mcg Oral Q0600   midodrine  10 mg Oral Once per day on Tue Thu Sat   pantoprazole  40 mg Oral Daily   sevelamer carbonate  4.8 g Oral TID WC   sucroferric oxyhydroxide  500 mg Oral TID WC   Continuous Infusions:  anticoagulant sodium citrate     sodium thiosulfate 12.5 g in sodium chloride 0.9 % 150 mL 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 (03/29/22 1538)   PRN Meds:.acetaminophen, alteplase, anticoagulant sodium citrate, bisacodyl, heparin, heparin, iohexol, lactulose, loperamide, loratadine, ondansetron, mouth rinse, oxyCODONE, polyethylene glycol, simethicone  Antimicrobials: Anti-infectives (From admission, onward)    Start     Dose/Rate Route Frequency Ordered Stop   03/08/22 0045  vancomycin (VANCOCIN) IVPB 1000 mg/200 mL premix  Status:  Discontinued       Note to Pharmacy: Give in IR for surgical prophylaxis   1,000 mg 200  mL/hr over 60 Minutes Intravenous  Once 03/07/22 2345 03/07/22 2349   03/07/22 1001  vancomycin (VANCOCIN) IVPB 1000 mg/200 mL premix        over 60 Minutes Intravenous Continuous PRN 03/07/22 1027 03/07/22 1108   03/07/22 0953  vancomycin (VANCOCIN) 1-5 GM/200ML-% IVPB       Note to Pharmacy: Kandy Garrison J: cabinet override      03/07/22 0953 03/07/22 2159   03/04/22 1700  doxycycline (VIBRA-TABS) tablet 100 mg  Status:  Discontinued        100 mg Oral 2 times daily with meals 03/04/22 1519 03/07/22 1348   02/04/22 1637  vancomycin (VANCOCIN) 1-5 GM/200ML-% IVPB       Note to Pharmacy: Roe Coombs H: cabinet override      02/04/22 1637 02/04/22 1758   01/06/22 0600  vancomycin (VANCOCIN) IVPB 1000 mg/200 mL premix        1,000 mg 200 mL/hr over 60 Minutes Intravenous To Surgery 01/05/22 0709 01/07/22 0600   11/28/21 1215  vancomycin (VANCOCIN) IVPB 1000 mg/200 mL premix         1,000 mg 200 mL/hr over 60 Minutes Intravenous  Once 11/28/21 1117 11/28/21 1512   11/26/21 1000  vancomycin (VANCOCIN) IVPB 1000 mg/200 mL premix        1,000 mg 200 mL/hr over 60 Minutes Intravenous On call 11/25/21 1628 11/26/21 1740   11/04/21 1800  ceFEPIme (MAXIPIME) 2 g in sodium chloride 0.9 % 100 mL IVPB        2 g 200 mL/hr over 30 Minutes Intravenous Every M-W-F (1800) 11/03/21 0952 11/08/21 2359   11/04/21 1200  ceFEPIme (MAXIPIME) 2 g in sodium chloride 0.9 % 100 mL IVPB  Status:  Discontinued        2 g 200 mL/hr over 30 Minutes Intravenous Every M-W-F (Hemodialysis) 11/02/21 0941 11/03/21 0952   11/02/21 1800  ceFEPIme (MAXIPIME) 2 g in sodium chloride 0.9 % 100 mL IVPB        2 g 200 mL/hr over 30 Minutes Intravenous  Once 11/02/21 0941 11/02/21 1812   11/02/21 1200  ceFEPIme (MAXIPIME) 2 g in sodium chloride 0.9 % 100 mL IVPB  Status:  Discontinued        2 g 200 mL/hr over 30 Minutes Intravenous Every T-Th-Sa (Hemodialysis) 11/01/21 1934 11/02/21 0941   11/01/21 2200  metroNIDAZOLE (FLAGYL) tablet 500 mg        500 mg Oral Every 12 hours 11/01/21 1827 11/08/21 2225   11/01/21 1945  ceFEPIme (MAXIPIME) 1 g in sodium chloride 0.9 % 100 mL IVPB        1 g 200 mL/hr over 30 Minutes Intravenous  Once 11/01/21 1933 11/01/21 2032   11/01/21 1730  ceFEPIme (MAXIPIME) 1 g in sodium chloride 0.9 % 100 mL IVPB  Status:  Discontinued        1 g 200 mL/hr over 30 Minutes Intravenous Every 24 hours 11/01/21 1720 11/01/21 1933        I have personally reviewed the following labs and images: CBC: Recent Labs  Lab 03/25/22 1419 03/26/22 1018 03/29/22 1206  WBC 8.8  --  8.0  HGB 6.9* 6.9* 8.2*  HCT 23.3* 22.9* 27.5*  MCV 97.1  --  94.5  PLT 252  --  257   BMP &GFR Recent Labs  Lab 03/25/22 1419 03/29/22 1206  NA 139  138 137  K 4.9  4.9 4.3  CL 98  98 96*  CO2  25  26 26   GLUCOSE 116*  106* 68*  BUN 38*  38* 30*  CREATININE 9.69*  9.76* 7.44*  CALCIUM 9.3   9.3 9.4  PHOS 5.1* 6.8*   Estimated Creatinine Clearance: 11.7 mL/min (A) (by C-G formula based on SCr of 7.44 mg/dL (H)). Liver & Pancreas: Recent Labs  Lab 03/25/22 1419 03/29/22 1206  ALBUMIN 2.6* 2.9*   No results for input(s): "LIPASE", "AMYLASE" in the last 168 hours. No results for input(s): "AMMONIA" in the last 168 hours. Diabetic: No results for input(s): "HGBA1C" in the last 72 hours. No results for input(s): "GLUCAP" in the last 168 hours. Cardiac Enzymes: No results for input(s): "CKTOTAL", "CKMB", "CKMBINDEX", "TROPONINI" in the last 168 hours. No results for input(s): "PROBNP" in the last 8760 hours. Coagulation Profile: No results for input(s): "INR", "PROTIME" in the last 168 hours. Thyroid Function Tests: No results for input(s): "TSH", "T4TOTAL", "FREET4", "T3FREE", "THYROIDAB" in the last 72 hours. Lipid Profile: No results for input(s): "CHOL", "HDL", "LDLCALC", "TRIG", "CHOLHDL", "LDLDIRECT" in the last 72 hours. Anemia Panel: No results for input(s): "VITAMINB12", "FOLATE", "FERRITIN", "TIBC", "IRON", "RETICCTPCT" in the last 72 hours. Urine analysis: No results found for: "COLORURINE", "APPEARANCEUR", "LABSPEC", "PHURINE", "GLUCOSEU", "HGBUR", "BILIRUBINUR", "KETONESUR", "PROTEINUR", "UROBILINOGEN", "NITRITE", "LEUKOCYTESUR" Sepsis Labs: Invalid input(s): "PROCALCITONIN", "LACTICIDVEN"  Microbiology: No results found for this or any previous visit (from the past 240 hour(s)).  Radiology Studies: No results found.    Courtney Gray T. [place]  If 7PM-7AM, please contact night-coverage [email or web address] 04/01/2022, 1:07 PM

## 2022-04-01 NOTE — Progress Notes (Signed)
[facility] Progress Note   Subjective: Seen in room, getting AM care. No C/Os. HD later today.  Objective Vitals:   03/31/22 1753 03/31/22 2101 04/01/22 0531 04/01/22 1001  BP: (!) 148/71 (!) 167/74 (!) 154/69 (!) 149/59  Pulse: 65 67 68 66  Resp: _0 Temp: 98.2 F (36.8 C) 98.2 F (36.8 C) 98.3 F (36.8 C) 98.1 F (36.7 C)  TempSrc: Oral Oral Oral Oral  SpO2: 100% 100% 100% 100%  Height:       Physical Exam General: Chronically ill appearing obese female in NAD Heart: S1,S2 2/6 SEM RRR Lungs: CTAB A/P Abdomen: obese NABS Extremities: Bilateral AKA no stump edema, no edema lower back or forearms.  Dialysis Access: RIJ TDC Drsg intact.   Additional Objective Labs: Basic Metabolic Panel: Recent Labs  Lab 03/25/22 1419 03/29/22 1206  NA 139  138 137  K 4.9  4.9 4.3  CL 98  98 96*  CO2 _1 GLUCOSE 116*  106* 68*  BUN 38*  38* 30*  CREATININE 9.69*  9.76* 7.44*  CALCIUM 9.3  9.3 9.4  PHOS 5.1* 6.8*   Liver Function Tests: Recent Labs  Lab 03/25/22 1419 03/29/22 1206  ALBUMIN 2.6* 2.9*   No results for input(s): "LIPASE", "AMYLASE" in the last 168 hours. CBC: Recent Labs  Lab 03/25/22 1419 03/26/22 1018 03/29/22 1206  WBC 8.8  --  8.0  HGB 6.9* 6.9* 8.2*  HCT 23.3* 22.9* 27.5*  MCV 97.1  --  94.5  PLT 252  --  257   Blood Culture    Component Value Date/Time   SDES BLOOD LEFT ANTECUBITAL 10/16/2021 1534   SPECREQUEST  10/16/2021 1534    BOTTLES DRAWN AEROBIC AND ANAEROBIC Blood Culture results may not be optimal due to an inadequate volume of blood received in culture bottles   CULT  10/16/2021 1534    NO GROWTH 5 DAYS Performed at [facility], [place] [address] [place], [place] [address]    REPTSTATUS 10/21/2021 FINAL 10/16/2021 1534    Cardiac Enzymes: No results for input(s): "CKTOTAL", "CKMB", "CKMBINDEX", "TROPONINI" in the last 168 hours. CBG: No results for input(s): "GLUCAP" in the last  168 hours. Iron Studies: No results for input(s): "IRON", "TIBC", "TRANSFERRIN", "FERRITIN" in the last 72 hours. _2 @ Studies/Results: No results found. Medications:  anticoagulant sodium citrate     sodium thiosulfate 12.5 g in sodium chloride 0.9 % 150 mL 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 (03/29/22 1538)    aspirin EC  81 mg Oral Daily   atorvastatin  10 mg Oral Daily   Chlorhexidine Gluconate Cloth  6 each Topical Q0600   darbepoetin (ARANESP) injection - DIALYSIS  200 mcg Subcutaneous Q Tue-1800   docusate sodium  100 mg Oral BID   famotidine  20 mg Oral Daily   feeding supplement  1 Container Oral TID BM   gabapentin  100 mg Oral Q1200   gabapentin  200 mg Oral QHS   Gerhardt's butt cream   Topical BID   hydroquinone   Topical BID   levothyroxine  100 mcg Oral Q0600   midodrine  10 mg Oral Once per day on Tue Thu Sat   pantoprazole  40 mg Oral Daily   sevelamer carbonate  4.8 g Oral TID WC   sucroferric oxyhydroxide  500 mg Oral TID WC     Assessment/Plan: Inability to obtain HD at OP center D/T body habitus and wound:  Difficult situation with no clear answers yet. PT working with pt -  met with seating specialist to get evaluated for custom chair/cushion that will allow her sit for longer periods.  Appreciate efforts from PT team.  ESRD: Usual TTS schedule - was refusing HD at times but is now back on schedule, next HD 04/03/2022 HD access: new TDC placed 02/04/2022, TDC stopped working again 11/30. IR consulted - S/p venogram, angioplasty of SVC and innominate veins, and TDC exchange. No recent issues reported.  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 Started empirically on sodium thiosulfate, but caused vomiting so stopped - now getting 1/2 dose - 12.44m q HD, refuses increased dose. Not on any calcium based products or vitamin D. Needs better control of Phos - have added  Velphoro as well. HypoTN/volume: On midodrine 166mqHD, with 1091mrn mid HD w/parameters to keep SBP >100 d/t becoming symptomatic when it drops lower. Significant weight loss this admit. Severe ischial sacral decubitus wound: CT showing chronic osteomyelitis. Limiting her, noted to be slow healing. Goal to tolerate sitting upright - which she is doing with PT.  ID started doxycycline which caused diarrhea - now off.  COPD/Chronic O2 use: Prior O2 at home (5L/min) -> tapered to 2-3L/min here Anemia of ESRD: on max dose Aranesp 200m57meekly. Recent Fe load completed. Hgb drifted down to 6.9, unable to give PRBC yesterday due to HD system clotting/unable to obtain IV access. Pt is asymptomatic. After much drama, finally rec'd 1 unit of PBCs via TDC.[place] HGB.  Secondary hyperparathyroidism: Phos 5.1-improving. On two binders. CorrCa elevated. Not on VDRA, last PTH 86 (low) on 03/11/22. Avoid all calcium based medications for Vitamin D d/t concern for calciphylaxis.  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 Nutrition - diet continues to be changed from renal.  Currently on regular diet.  Would really prefer she be on a renal diet but patient has been refusing Disposition: Difficult situation. Prior to admit, patient required 6 staff members and 2 hoyer pads for transfer into HD chair. Could not stay on HD for full tx. 2/2 pain of multiple decubitus ulcers and PTAR unable to lift pt into ambulance for transport.SW working on LTC at SNF.Con-wayll need to tolerate 5-6hrs in a chair to tolerate OP dialysis including transport, treatment and cannulation time.  The main issue is her Rt ischial wound. PT has arranged seating specialist with plan for custom power chair/cushion that would keep pressure off wounds and allow her to sit for longer periods   Katelan Hirt H. Lamiya Naas NP-C 04/01/2022, 11:52 AM  [employer]-[phone]

## 2022-04-02 DIAGNOSIS — N186 End stage renal disease: Secondary | ICD-10-CM | POA: Diagnosis not present

## 2022-04-02 DIAGNOSIS — I5023 Acute on chronic systolic (congestive) heart failure: Secondary | ICD-10-CM | POA: Diagnosis not present

## 2022-04-02 LAB — COMPREHENSIVE METABOLIC PANEL
ALT: 9 U/L (ref 0–44)
AST: 25 U/L (ref 15–41)
Albumin: 3 g/dL — ABNORMAL LOW (ref 3.5–5.0)
Alkaline Phosphatase: 52 U/L (ref 38–126)
Anion gap: 13 (ref 5–15)
BUN: 18 mg/dL (ref 6–20)
CO2: 29 mmol/L (ref 22–32)
Calcium: 8.7 mg/dL — ABNORMAL LOW (ref 8.9–10.3)
Chloride: 96 mmol/L — ABNORMAL LOW (ref 98–111)
Creatinine, Ser: 4.84 mg/dL — ABNORMAL HIGH (ref 0.44–1.00)
GFR, Estimated: 10 mL/min — ABNORMAL LOW (ref >60)
Glucose, Bld: 70 mg/dL (ref 70–99)
Potassium: 3.4 mmol/L — ABNORMAL LOW (ref 3.5–5.1)
Sodium: 138 mmol/L (ref 135–145)
Total Bilirubin: 0.7 mg/dL (ref 0.3–1.2)
Total Protein: 7.5 g/dL (ref 6.5–8.1)

## 2022-04-02 LAB — CBC
HCT: 27.7 % — ABNORMAL LOW (ref 36.0–46.0)
Hemoglobin: 8.5 g/dL — ABNORMAL LOW (ref 12.0–15.0)
MCH: 28.5 pg (ref 26.0–34.0)
MCHC: 30.7 g/dL (ref 30.0–36.0)
MCV: 93 fL (ref 80.0–100.0)
Platelets: 213 10*3/uL (ref 150–400)
RBC: 2.98 MIL/uL — ABNORMAL LOW (ref 3.87–5.11)
RDW: 17.2 % — ABNORMAL HIGH (ref 11.5–15.5)
WBC: 9.2 10*3/uL (ref 4.0–10.5)
nRBC: 0 % (ref 0.0–0.2)

## 2022-04-02 NOTE — Progress Notes (Signed)
  Progress Note   Patient: Courtney Gray KNL:[id number] DOB: [birth year] DOA: 11/01/2021     152 DOS: the patient was seen and examined on 04/02/2022 at 11:20AM      Brief hospital course: 51 year old F with PMH of ESRD on HD TTS, recent hospitalization for bacteremia, hepatic and splenic abscess on long-term IV antibiotics, diastolic CHF, PVD s/p bilateral AKA, IDDM-2, morbid obesity and chronic sacral wound presenting with progressive shortness of breath in the setting of missed dialysis and admitted for hypertensive emergency.      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  PVD (peripheral vascular disease) (HCC) History of Bilateral AKA  Class 3 obesity (HCC) BMI >40           Subjective: No change. Has phantom limb pain     Physical Exam: BP (!) 148/65 (BP Location: Right Arm)   Pulse 63   Temp 98.3 F (36.8 C) (Oral)   Resp 17   Ht 5\' 5"  (1.651 m)   Wt 121.6 kg   SpO2 99%   BMI 44.60 kg/m   Adult female, watching TV RRR no mrumurs, no periphoeral edmea Respiraotry rate normal, lungs clear without rales or wheezes.   Data Reviewed: None new  Family Communication: None present    Disposition: Status is: Inpatient Medically ready for discharge when bed available.        Author: Edwin Dada, MD 04/02/2022 5:21 PM  For on call review [email or web address].

## 2022-04-02 NOTE — TOC Progression Note (Signed)
Transition of Care [facility]) - Initial/Assessment Note    Patient Details  Name: Courtney Gray MRN: [id number] Date of Birth: Aug 21, 1970  [facility]) CM/SW Contact:    Milinda Antis, [place] Phone Number: 04/02/2022, 12:12 PM  Clinical Narrative:                 LCSW met with patient at bedside to continue the discussion of discharge planning.  LCSW informed the patient of information in reference to insurance's ability to pay for the chair.  [employer] will not cover the cost of the chair if patient discharges to a SNF.)  The patient reports that she really needs the chair and is willing to look into options to go home with assistance.  LCSW informed that the patient's daughter is currently residing in a hotel. LCSW explained the barriers that may arise when attempting to get the patient home (outpatient dialysis clinic accepting patient in tilt wheelchair, wound care, home health/ aides, need for daughter's participation in care, housing, ect.). The patient reports that if possible she would like to be "home" with her daughter.     TOC will continue to follow.  Expected Discharge Plan: Long Term Nursing Home Barriers to Discharge: No SNF bed, Transportation, Waiting for outpatient dialysis   Patient Goals and CMS Choice Patient states their goals for this hospitalization and ongoing recovery are:: To be at a facility close to her daughter          Expected Discharge Plan and Services       Living arrangements for the past 2 months: Single Family Home                                      Prior Living Arrangements/Services Living arrangements for the past 2 months: Single Family Home Lives with:: Adult Children Patient language and need for interpreter reviewed:: Yes        Need for Family Participation in Patient Care: Yes (Comment) Care giver support system in place?: No (comment)   Criminal Activity/Legal Involvement Pertinent to Current  Situation/Hospitalization: No - Comment as needed  Activities of Daily Living Home Assistive Devices/Equipment: [employer] fast streamer ADL Screening (condition at time of admission) Patient's cognitive ability adequate to safely complete daily activities?: No Is the patient deaf or have difficulty hearing?: No Does the patient have difficulty seeing, even when wearing glasses/contacts?: No Does the patient have difficulty concentrating, remembering, or making decisions?: No Patient able to express need for assistance with ADLs?: Yes Does the patient have difficulty dressing or bathing?: Yes Independently performs ADLs?: No Does the patient have difficulty walking or climbing stairs?: Yes Weakness of Legs: Both Weakness of Arms/Hands: Both  Permission Sought/Granted Permission sought to share information with : Other (comment) Permission granted to share information with : Yes, Verbal Permission Granted  Share Information with NAME: Deloach,antoinette (Daughter)   [phone]  Permission granted to share info w AGENCY: SNF        Emotional Assessment Appearance:: Appears older than stated age Attitude/Demeanor/Rapport: Engaged Affect (typically observed): Tearful/Crying, Accepting Orientation: : Oriented to Situation, Oriented to  Time, Oriented to Place, Oriented to Self Alcohol / Substance Use: Not Applicable Psych Involvement: No (comment)  Admission diagnosis:  End stage renal disease ([place]) [N18.6] Fluid overload [E87.70] Obesity with serious comorbidity, unspecified classification, unspecified obesity type [E66.9] Pressure injury of right buttock, stage 2 ([place]) [B34.193] Patient Active  Problem List   Diagnosis Date Noted   Nodule of skin of abdomen 02/16/2022   Acute on chronic systolic CHF (congestive heart failure) ([place]) 02/11/2022   Acute on chronic diastolic CHF (congestive heart failure) ([place]) 12/12/2021   PAD (peripheral artery disease) ([place]) 12/12/2021   Irritable bowel  syndrome (IBS) 12/12/2021   Amputation of left middle finger 12/12/2021   GERD (gastroesophageal reflux disease) 12/12/2021   Hypertension    Type 2 diabetes mellitus with hyperlipidemia (HCC)    Decubitus ulcer of sacral region, stage 3 (HCC)    Class 3 obesity (HCC)    Bacteremia    PVD (peripheral vascular disease) (HCC)    Hepatic abscess    Pressure injury of skin 09/13/2021   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[place])    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   Lactic acidosis 09/10/2021   Mixed diabetic hyperlipidemia associated with type 2 diabetes mellitus ([place]) 09/10/2021   Coronary artery disease involving native coronary artery of native heart without angina pectoris 09/10/2021   Elevated troponin level not due myocardial infarction 09/10/2021   Hypotension 09/10/2021   Dry gangrene (HCC)    Pain 08/03/2021   Ischemic foot pain at rest 08/03/2021   Hypoglycemia 07/10/2021   Volume overload 07/09/2021   Leukocytosis 06/11/2021   Acute gout 06/11/2021   Hyperkalemia 05/21/2021   Hemorrhoids    Hyperlipidemia 05/18/2021   Rectal bleeding 05/17/2021   Morbid obesity with BMI of 60.0-69.9, adult ([place]) 05/17/2021   NSTEMI (non-ST elevated myocardial infarction) ([place]) 05/16/2021   Essential hypertension 09/01/2020   End stage renal disease ([place]) 09/01/2020   Chronic respiratory failure with hypoxia ([place]) 09/01/2020   Hypertensive emergency    Hypertensive urgency 11/28/2019   Uncontrolled type 2 diabetes mellitus with hypoglycemia, with long-term current use of insulin ([place]) 11/28/2019   Anemia due to chronic kidney disease 11/28/2019   PCP:  Monico Blitz, MD Pharmacy:   [place], [place] [address] [place] [address] Phone: [phone] Fax: [phone]  [facility] [address] [address] Phone: [phone] Fax: [phone]  [place], [place] - [address] [place] [address] Phone: [phone] Fax: [phone]     Social Determinants of Health (SDOH) Social History: [place]: No Food Insecurity (12/11/2021)  Housing: Low Risk  (12/11/2021)  Transportation Needs: No Transportation Needs (12/11/2021)  Utilities: At Risk (12/11/2021)  Tobacco Use: Low Risk  (03/07/2022)   SDOH Interventions:     Readmission Risk Interventions    09/16/2021    4:36 PM 08/28/2021   11:50 AM 07/12/2021   12:38 PM  Readmission Risk Prevention Plan  Transportation Screening Complete Complete Complete  Medication Review Press photographer)  Complete Complete  PCP or Specialist appointment within 3-5 days of discharge  Complete Complete  HRI or Home Care Consult Complete Complete Complete  SW Recovery Care/Counseling Consult Complete Complete Complete  Palliative Care Screening  Not Applicable Not Applicable  Skilled Nursing Facility Complete Not Applicable Complete

## 2022-04-02 NOTE — Progress Notes (Signed)
Bath offered and pt. Refused  Anastasio Auerbach

## 2022-04-02 NOTE — Progress Notes (Signed)
I asked patient earlier in the day what time she wanted her bath. She choose 3pm. Myself and the charge nurse got all supplies together and went in her room at 1500. She was sleeping and said that she was tired and did not want a bath.

## 2022-04-02 NOTE — Progress Notes (Signed)
PT Cancellation Note  Patient Details Name: Courtney Gray MRN: [id number] DOB: 1970/09/01   Cancelled Treatment:    Reason Eval/Treat Not Completed: Patient declined, no reason specified  1st attempt, pt eating lunch, not prepared for PT until later. 2nd attempt, pt just received pain medications, states they are not working yet, requests come back later. 3rd attempt, pt states she is too tired from taking pain medication.  Will follow and progress as tolerated.  Candie Mile, PT, DPT Physical Therapist Acute Rehabilitation Services [facility]  04/02/2022, 3:12 PM

## 2022-04-02 NOTE — Progress Notes (Cosign Needed)
[facility] Progress Note   Subjective: Seen in room watching [employer]. No C/Os.     Objective Vitals:   04/01/22 2305 04/01/22 2345 04/01/22 2346 04/02/22 0922  BP: (!) 147/63  (!) 172/66 (!) 148/65  Pulse: 68 74 68 63  Resp: 17   17  Temp: 99.1 F (37.3 C) 98.4 F (36.9 C)  98.3 F (36.8 C)  TempSrc: Oral Oral  Oral  SpO2: 94% 100%  99%  Height:       Physical Exam General: Chronically ill appearing obese female in NAD Heart: S1,S2 2/6 SEM RRR Lungs: CTAB A/P Abdomen: obese NABS Extremities: Bilateral AKA no stump edema, no edema lower back or forearms.  Dialysis Access: RIJ TDC Drsg intact.   Additional Objective Labs: Basic Metabolic Panel: Recent Labs  Lab 03/29/22 1206 04/01/22 2330  NA 137 138  K 4.3 3.4*  CL 96* 96*  CO2 26 29  GLUCOSE 68* 70  BUN 30* 18  CREATININE 7.44* 4.84*  CALCIUM 9.4 8.7*  PHOS 6.8*  --    Liver Function Tests: Recent Labs  Lab 03/29/22 1206 04/01/22 2330  AST  --  25  ALT  --  9  ALKPHOS  --  52  BILITOT  --  0.7  PROT  --  7.5  ALBUMIN 2.9* 3.0*   No results for input(s): "LIPASE", "AMYLASE" in the last 168 hours. CBC: Recent Labs  Lab 03/29/22 1206 04/01/22 2330  WBC 8.0 9.2  HGB 8.2* 8.5*  HCT 27.5* 27.7*  MCV 94.5 93.0  PLT 257 213   Blood Culture    Component Value Date/Time   SDES BLOOD LEFT ANTECUBITAL 10/16/2021 1534   SPECREQUEST  10/16/2021 1534    BOTTLES DRAWN AEROBIC AND ANAEROBIC Blood Culture results may not be optimal due to an inadequate volume of blood received in culture bottles   CULT  10/16/2021 1534    NO GROWTH 5 DAYS Performed at [facility], [place] [address] [place], [place] [address]    REPTSTATUS 10/21/2021 FINAL 10/16/2021 1534    Cardiac Enzymes: No results for input(s): "CKTOTAL", "CKMB", "CKMBINDEX", "TROPONINI" in the last 168 hours. CBG: No results for input(s): "GLUCAP" in the last 168 hours. Iron Studies: No results for input(s): "IRON",  "TIBC", "TRANSFERRIN", "FERRITIN" in the last 72 hours. _0 @ Studies/Results: No results found. Medications:  sodium thiosulfate 12.5 g in sodium chloride 0.9 % 150 mL 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 (04/01/22 2143)    aspirin EC  81 mg Oral Daily   atorvastatin  10 mg Oral Daily   Chlorhexidine Gluconate Cloth  6 each Topical Q0600   darbepoetin (ARANESP) injection - DIALYSIS  200 mcg Subcutaneous Q Tue-1800   docusate sodium  100 mg Oral BID   famotidine  20 mg Oral Daily   feeding supplement  1 Container Oral TID BM   gabapentin  100 mg Oral Q1200   gabapentin  200 mg Oral QHS   Gerhardt's butt cream   Topical BID   hydroquinone   Topical BID   levothyroxine  100 mcg Oral Q0600   midodrine  10 mg Oral Once per day on Tue Thu Sat   pantoprazole  40 mg Oral Daily   sevelamer carbonate  4.8 g Oral TID WC   sucroferric oxyhydroxide  500 mg Oral TID WC     Assessment/Plan: Inability to obtain HD at OP center D/T body habitus and wound: Difficult situation with no clear answers yet.  PT working with pt -  met with seating specialist to get evaluated for custom chair/cushion that will allow her sit for longer periods.  Appreciate efforts from PT team.  ESRD: Usual TTS schedule - was refusing HD at times but is now back on schedule, next HD 04/03/2022 HD access: new TDC placed 02/04/2022, TDC stopped working again 11/30. IR consulted - S/p venogram, angioplasty of SVC and innominate veins, and TDC exchange. No recent issues reported.  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 Started empirically on sodium thiosulfate, but caused vomiting so stopped - now getting 1/2 dose - 12.46m q HD, refuses increased dose. Not on any calcium based products or vitamin D. Needs better control of Phos - have added Velphoro as well. HypoTN/volume: On midodrine 161mqHD, with 1066mrn mid HD w/parameters to  keep SBP >100 d/t becoming symptomatic when it drops lower. Significant weight loss this admit. Severe ischial sacral decubitus wound: CT showing chronic osteomyelitis. Limiting her, noted to be slow healing. Goal to tolerate sitting upright - which she is doing with PT.  ID started doxycycline which caused diarrhea - now off.  COPD/Chronic O2 use: Prior O2 at home (5L/min) -> tapered to 2-3L/min here Anemia of ESRD: on max dose Aranesp 200m43meekly. Recent Fe load completed. Hgb drifted down to 6.9, unable to give PRBC yesterday due to HD system clotting/unable to obtain IV access. Pt is asymptomatic. After much drama, finally rec'd 1 unit of PBCs via TDC.[place] HGB.  Secondary hyperparathyroidism: Phos 5.1-improving. On two binders. CorrCa elevated. Not on VDRA, last PTH 86 (low) on 03/11/22. Avoid all calcium based medications for Vitamin D d/t concern for calciphylaxis.  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 Nutrition - diet continues to be changed from renal.  Currently on regular diet.  Would really prefer she be on a renal diet but patient has been refusing Disposition: Difficult situation. Prior to admit, patient required 6 staff members and 2 hoyer pads for transfer into HD chair. Could not stay on HD for full tx. 2/2 pain of multiple decubitus ulcers and PTAR unable to lift pt into ambulance for transport.SW working on LTC at SNF.Con-wayll need to tolerate 5-6hrs in a chair to tolerate OP dialysis including transport, treatment and cannulation time.  The main issue is her Rt ischial wound. PT has arranged seating specialist with plan for custom power chair/cushion that would keep pressure off wounds and allow her to sit for longer periods   Almin Livingstone H. Fynn Adel NP-C 04/02/2022, 12:55 PM  [employer]-[phone]

## 2022-04-03 DIAGNOSIS — I5023 Acute on chronic systolic (congestive) heart failure: Secondary | ICD-10-CM | POA: Diagnosis not present

## 2022-04-03 MED ORDER — GABAPENTIN 100 MG PO CAPS
100.0000 mg | ORAL_CAPSULE | Freq: Every day | ORAL | Status: DC
  Administered 2022-04-04 – 2022-05-14 (×40): 100 mg via ORAL
  Filled 2022-04-03 (×42): qty 1

## 2022-04-03 MED ORDER — HEPARIN SODIUM (PORCINE) 1000 UNIT/ML DIALYSIS
20.0000 [IU]/kg | INTRAMUSCULAR | Status: DC | PRN
  Administered 2022-04-03: 2400 [IU] via INTRAVENOUS_CENTRAL
  Filled 2022-04-03: qty 3

## 2022-04-03 MED ORDER — HEPARIN SODIUM (PORCINE) 1000 UNIT/ML IJ SOLN
INTRAMUSCULAR | Status: AC
  Administered 2022-04-04: 4200 [IU]
  Filled 2022-04-03: qty 4

## 2022-04-03 MED ORDER — ALTEPLASE 2 MG IJ SOLR: 2.0000 mg | Freq: Once | INTRAMUSCULAR | Status: DC | PRN

## 2022-04-03 MED ORDER — PREGABALIN 25 MG PO CAPS
25.0000 mg | ORAL_CAPSULE | Freq: Every day | ORAL | Status: DC
  Administered 2022-04-04 – 2022-04-08 (×5): 25 mg via ORAL
  Filled 2022-04-03 (×5): qty 1

## 2022-04-03 MED ORDER — HEPARIN SODIUM (PORCINE) 1000 UNIT/ML DIALYSIS: 1000.0000 [IU] | INTRAMUSCULAR | Status: DC | PRN

## 2022-04-03 MED ORDER — ANTICOAGULANT SODIUM CITRATE 4% (200MG/5ML) IV SOLN: 5.0000 mL | Status: DC | PRN

## 2022-04-03 NOTE — Progress Notes (Signed)
[facility] Progress Note   Subjective: Seen in room. Needs her bed fixed-no bed wts since 04/01/2022. No C/Os. HD later today.    Objective Vitals:   04/02/22 1843 04/02/22 2146 04/03/22 0526 04/03/22 0904  BP: (!) 164/69 (!) 166/71 (!) 149/74 115/69  Pulse: 70 73 69 71  Resp: _0 Temp: 98.7 F (37.1 C) 98.5 F (36.9 C) 98.4 F (36.9 C) 98.4 F (36.9 C)  TempSrc: Oral Oral Oral Oral  SpO2: 100% 98% 100% 99%  Height:       Physical Exam General: Chronically ill appearing obese female in NAD Heart: S1,S2 2/6 SEM RRR Lungs: CTAB A/P Abdomen: obese NABS Extremities: Bilateral AKA no stump edema, no edema lower back or forearms.  Dialysis Access: RIJ TDC Drsg intact.  Additional Objective Labs: Basic Metabolic Panel: Recent Labs  Lab 03/29/22 1206 04/01/22 2330  NA 137 138  K 4.3 3.4*  CL 96* 96*  CO2 26 29  GLUCOSE 68* 70  BUN 30* 18  CREATININE 7.44* 4.84*  CALCIUM 9.4 8.7*  PHOS 6.8*  --    Liver Function Tests: Recent Labs  Lab 03/29/22 1206 04/01/22 2330  AST  --  25  ALT  --  9  ALKPHOS  --  52  BILITOT  --  0.7  PROT  --  7.5  ALBUMIN 2.9* 3.0*   No results for input(s): "LIPASE", "AMYLASE" in the last 168 hours. CBC: Recent Labs  Lab 03/29/22 1206 04/01/22 2330  WBC 8.0 9.2  HGB 8.2* 8.5*  HCT 27.5* 27.7*  MCV 94.5 93.0  PLT 257 213   Blood Culture    Component Value Date/Time   SDES BLOOD LEFT ANTECUBITAL 10/16/2021 1534   SPECREQUEST  10/16/2021 1534    BOTTLES DRAWN AEROBIC AND ANAEROBIC Blood Culture results may not be optimal due to an inadequate volume of blood received in culture bottles   CULT  10/16/2021 1534    NO GROWTH 5 DAYS Performed at [facility], [place] [address] [place], [place] [address]    REPTSTATUS 10/21/2021 FINAL 10/16/2021 1534    Cardiac Enzymes: No results for input(s): "CKTOTAL", "CKMB", "CKMBINDEX", "TROPONINI" in the last 168 hours. CBG: No results for input(s):  "GLUCAP" in the last 168 hours. Iron Studies: No results for input(s): "IRON", "TIBC", "TRANSFERRIN", "FERRITIN" in the last 72 hours. _1 @ Studies/Results: No results found. Medications:  sodium thiosulfate 12.5 g in sodium chloride 0.9 % 150 mL 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 (04/01/22 2143)    aspirin EC  81 mg Oral Daily   atorvastatin  10 mg Oral Daily   Chlorhexidine Gluconate Cloth  6 each Topical Q0600   darbepoetin (ARANESP) injection - DIALYSIS  200 mcg Subcutaneous Q Tue-1800   docusate sodium  100 mg Oral BID   famotidine  20 mg Oral Daily   feeding supplement  1 Container Oral TID BM   gabapentin  100 mg Oral Q1200   gabapentin  200 mg Oral QHS   Gerhardt's butt cream   Topical BID   hydroquinone   Topical BID   levothyroxine  100 mcg Oral Q0600   midodrine  10 mg Oral Once per day on Tue Thu Sat   pantoprazole  40 mg Oral Daily   sevelamer carbonate  4.8 g Oral TID WC   sucroferric oxyhydroxide  500 mg Oral TID WC     Assessment/Plan: Inability to obtain HD at OP center D/T body habitus and  wound: Difficult situation with no clear answers yet. PT working with pt -  met with seating specialist to get evaluated for custom chair/cushion that will allow her sit for longer periods.  Appreciate efforts from PT team.  ESRD: Usual TTS schedule - was refusing HD at times but is now back on schedule, next HD 04/03/2022 HD access: new TDC placed 02/04/2022, TDC stopped working again 11/30. IR consulted - S/p venogram, angioplasty of SVC and innominate veins, and TDC exchange. No recent issues reported.  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 Started empirically on sodium thiosulfate, but caused vomiting so stopped - now getting 1/2 dose - 12.23m q HD, refuses increased dose. Not on any calcium based products or vitamin D. Needs better control of Phos - have added Velphoro as  well. HypoTN/volume: On midodrine 136mqHD, with 1056mrn mid HD w/parameters to keep SBP >100 d/t becoming symptomatic when it drops lower. Significant weight loss this admit. Severe ischial sacral decubitus wound: CT showing chronic osteomyelitis. Limiting her, noted to be slow healing. Goal to tolerate sitting upright - which she is doing with PT.  ID started doxycycline which caused diarrhea - now off.  COPD/Chronic O2 use: Prior O2 at home (5L/min) -> tapered to 2-3L/min here Anemia of ESRD: on max dose Aranesp 200m63meekly. Recent Fe load completed. Hgb drifted down to 6.9, unable to give PRBC yesterday due to HD system clotting/unable to obtain IV access. Pt is asymptomatic. After much drama, finally rec'd 1 unit of PBCs via TDC.[place] HGB.  Secondary hyperparathyroidism: Phos 5.1-improving. On two binders. CorrCa elevated. Not on VDRA, last PTH 86 (low) on 03/11/22. Avoid all calcium based medications for Vitamin D d/t concern for calciphylaxis.  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 Nutrition - diet continues to be changed from renal.  Currently on regular diet.  Would really prefer she be on a renal diet but patient has been refusing Disposition: Difficult situation. Prior to admit, patient required 6 staff members and 2 hoyer pads for transfer into HD chair. Could not stay on HD for full tx. 2/2 pain of multiple decubitus ulcers and PTAR unable to lift pt into ambulance for transport.SW working on LTC at SNF.Con-wayll need to tolerate 5-6hrs in a chair to tolerate OP dialysis including transport, treatment and cannulation time.  The main issue is her Rt ischial wound. PT has arranged seating specialist with plan for custom power chair/cushion that would keep pressure off wounds and allow her to sit for longer periods   Pradeep Beaubrun H. Hogan Hoobler NP-C 04/03/2022, 9:37 AM  [employer]-[phone]

## 2022-04-03 NOTE — Progress Notes (Signed)
  Progress Note   Patient: Courtney Gray AJG:[id number] DOB: 1970/11/22 DOA: 11/01/2021     153 DOS: the patient was seen and examined on 04/03/2022 at 11:20AM      Brief hospital course: 51 year old F with PMH of ESRD on HD TTS, recent hospitalization for bacteremia, hepatic and splenic abscess on long-term IV antibiotics, diastolic CHF, PVD s/p bilateral AKA, IDDM-2, morbid obesity and chronic sacral wound presenting with progressive shortness of breath in the setting of missed dialysis and admitted for hypertensive emergency.      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  PVD (peripheral vascular disease) (HCC) History of Bilateral AKA  Class 3 obesity (HCC) BMI >40           Subjective: Sleepy today, no new complaints.     Physical Exam: BP 115/69 (BP Location: Right Wrist)   Pulse 71   Temp 98.4 F (36.9 C) (Oral)   Resp 18   Ht 5\' 5"  (1.651 m)   Wt 121.6 kg   SpO2 99%   BMI 44.60 kg/m   Obese adult female, no acute distress Sleepy RRR no murmurs, no peripheral edema Lungs clear, no rales or wheezes    Data Reviewed: None new  Family Communication: None present    Disposition: Status is: Inpatient Medically ready for discharge when bed available.        Author: Edwin Dada, MD 04/03/2022 3:13 PM  For on call review [email or web address].

## 2022-04-04 DIAGNOSIS — I5023 Acute on chronic systolic (congestive) heart failure: Secondary | ICD-10-CM | POA: Diagnosis not present

## 2022-04-04 NOTE — Progress Notes (Signed)
[facility] Progress Note   Subjective:   Had HD overnight, went well but had trouble sleeping after and is tired today. Denies SOB, CP, palpitations,dizziness or nausea.   Objective Vitals:   04/04/22 0018 04/04/22 0030 04/04/22 0147 04/04/22 1029  BP:   (!) 161/58 (!) 146/50  Pulse: 65  68 (!) 49  Resp: (!) 6  18   Temp:   98.7 F (37.1 C) 98.6 F (37 C)  TempSrc:   Oral Oral  SpO2: 100%  93% 99%  Weight:  119.3 kg    Height:       Physical Exam General: Chronically ill appearing female, alert and in NAD Heart: RRR, 2/6 systolic murmur Lungs: CTA bilaterally Abdomen: Obese, non-distended, +BS Extremities: No stump/hip edema Dialysis Access: R [facility]  Additional Objective Labs: Basic Metabolic Panel: Recent Labs  Lab 03/29/22 1206 04/01/22 2330  NA 137 138  K 4.3 3.4*  CL 96* 96*  CO2 26 29  GLUCOSE 68* 70  BUN 30* 18  CREATININE 7.44* 4.84*  CALCIUM 9.4 8.7*  PHOS 6.8*  --    Liver Function Tests: Recent Labs  Lab 03/29/22 1206 04/01/22 2330  AST  --  25  ALT  --  9  ALKPHOS  --  52  BILITOT  --  0.7  PROT  --  7.5  ALBUMIN 2.9* 3.0*   No results for input(s): "LIPASE", "AMYLASE" in the last 168 hours. CBC: Recent Labs  Lab 03/29/22 1206 04/01/22 2330  WBC 8.0 9.2  HGB 8.2* 8.5*  HCT 27.5* 27.7*  MCV 94.5 93.0  PLT 257 213   Blood Culture    Component Value Date/Time   SDES BLOOD LEFT ANTECUBITAL 10/16/2021 1534   SPECREQUEST  10/16/2021 1534    BOTTLES DRAWN AEROBIC AND ANAEROBIC Blood Culture results may not be optimal due to an inadequate volume of blood received in culture bottles   CULT  10/16/2021 1534    NO GROWTH 5 DAYS Performed at [facility], [place] [address] [place], [place] [address]    REPTSTATUS 10/21/2021 FINAL 10/16/2021 1534    Cardiac Enzymes: No results for input(s): "CKTOTAL", "CKMB", "CKMBINDEX", "TROPONINI" in the last 168 hours. CBG: No results for input(s): "GLUCAP" in the last 168  hours. Iron Studies: No results for input(s): "IRON", "TIBC", "TRANSFERRIN", "FERRITIN" in the last 72 hours. _0 @ Studies/Results: No results found. Medications:  sodium thiosulfate 12.5 g in sodium chloride 0.9 % 150 mL 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 (04/03/22 2307)    aspirin EC  81 mg Oral Daily   atorvastatin  10 mg Oral Daily   Chlorhexidine Gluconate Cloth  6 each Topical Q0600   darbepoetin (ARANESP) injection - DIALYSIS  200 mcg Subcutaneous Q Tue-1800   docusate sodium  100 mg Oral BID   famotidine  20 mg Oral Daily   feeding supplement  1 Container Oral TID BM   gabapentin  100 mg Oral Q1200   gabapentin  100 mg Oral QHS   Gerhardt's butt cream   Topical BID   hydroquinone   Topical BID   levothyroxine  100 mcg Oral Q0600   midodrine  10 mg Oral Once per day on Tue Thu Sat   pantoprazole  40 mg Oral Daily   pregabalin  25 mg Oral Daily   sevelamer carbonate  4.8 g Oral TID WC   sucroferric oxyhydroxide  500 mg Oral TID WC    Assessment/Plan: Inability to obtain HD at  OP center D/T body habitus and wound: Difficult situation with no clear answers yet. PT working with pt -  met with seating specialist to get evaluated for custom chair/cushion that will allow her sit for longer periods.  Appreciate efforts from PT team.  ESRD: Usual TTS schedule - was refusing HD at times but is now back on schedule, next HD 04/05/2022 HD access: new TDC placed 02/04/2022, TDC stopped working again 11/30. IR consulted - S/p venogram, angioplasty of SVC and innominate veins, and TDC exchange. No recent issues reported.  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 Started empirically on sodium thiosulfate, but caused vomiting so stopped - now getting 1/2 dose - 12.55m q HD, refuses increased dose. Not on any calcium based products or vitamin D. Needs better control of Phos - have added Velphoro  as well. HypoTN/volume: On midodrine 160mqHD, with 1059mrn mid HD w/parameters to keep SBP >100 d/t becoming symptomatic when it drops lower. Significant weight loss this admit. Severe ischial sacral decubitus wound: CT showing chronic osteomyelitis. Limiting her, noted to be slow healing. Goal to tolerate sitting upright - which she is doing with PT.  ID started doxycycline which caused diarrhea - now off.  COPD/Chronic O2 use: Prior O2 at home (5L/min) -> tapered to 2-3L/min here Anemia of ESRD: on max dose Aranesp 200m32meekly. Recent Fe load completed. Hgb drifted down to 6.9,  received 1 unit PRBC, no stable at 8.5.  Secondary hyperparathyroidism: Phos 6.8- trended back up. On two binders. CorrCa elevated. Not on VDRA, last PTH 86 (low) on 03/11/22. Avoid all calcium based medications for Vitamin D d/t concern for calciphylaxis.  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 Nutrition - diet continues to be changed from renal.  Currently on regular diet.  Would really prefer she be on a renal diet but patient has been refusing Disposition: Difficult situation. Prior to admit, patient required 6 staff members and 2 hoyer pads for transfer into HD chair. Could not stay on HD for full tx. 2/2 pain of multiple decubitus ulcers and PTAR unable to lift pt into ambulance for transport.SW working on LTC at SNF.Con-wayll need to tolerate 5-6hrs in a chair to tolerate OP dialysis including transport, treatment and cannulation time.  The main issue is her Rt ischial wound. PT has arranged seating specialist with plan for custom power chair/cushion that would keep pressure off wounds and allow her to sit for longer periods   Courtney Gray 04/04/2022, 11:03 AM  [facility] Pager: [phone]

## 2022-04-04 NOTE — Progress Notes (Signed)
Received patient in bed to unit.  Alert and oriented.  Informed consent signed and in chart.   Treatment initiated: 2036 Treatment completed: 0008  Patient tolerated well.  Transported back to the room  Alert, without acute distress.  Hand-off given to patient's nurse.   Access used: catheter Access issues: none  Total UF removed: 2500 Medication(s) given: Sodium thiosulfate Post HD VS: 98.4, 123/54(73), HR-67, RR-16, SP02-100 Post HD weight: 119.3kg   [facility]

## 2022-04-04 NOTE — Progress Notes (Signed)
  Progress Note   Patient: Courtney Gray [id number] DOB: [birth year] DOA: 11/01/2021     154 DOS: the patient was seen and examined on 04/04/2022 at 11:20AM      Brief hospital course: 51 year old F with PMH of ESRD on HD TTS, recent hospitalization for bacteremia, hepatic and splenic abscess on long-term IV antibiotics, diastolic CHF, PVD s/p bilateral AKA, IDDM-2, morbid obesity and chronic sacral wound presenting with progressive shortness of breath in the setting of missed dialysis and admitted for hypertensive emergency.      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  PVD (peripheral vascular disease) (HCC) History of Bilateral AKA  Class 3 obesity (HCC) BMI >40           Subjective: Sleepy today, no new complaints.     Physical Exam: BP (!) 171/73   Pulse 70   Temp 98.8 F (37.1 C) (Oral)   Resp 18   Ht 5\' 5"  (1.651 m)   Wt 119.3 kg   SpO2 99%   BMI 43.77 kg/m   Obese adult female, no acute distress Sleepy RRR no murmurs, no peripheral edema Lungs clear, no rales or wheezes    Data Reviewed: None new  Family Communication: None present    Disposition: Status is: Inpatient Medically ready for discharge when bed  available.        Author: Edwin Dada, MD 04/04/2022 7:19 PM  For on call review [email or web address].

## 2022-04-05 DIAGNOSIS — I5023 Acute on chronic systolic (congestive) heart failure: Secondary | ICD-10-CM | POA: Diagnosis not present

## 2022-04-05 NOTE — Progress Notes (Signed)
  Progress Note   Patient: Courtney Gray [id number]:[id number] DOB: 12-17-1970 DOA: 11/01/2021     155 DOS: the patient was seen and examined on 04/05/2022 at 11:20AM      Brief hospital course: 51 year old F with PMH of ESRD on HD TTS, recent hospitalization for bacteremia, hepatic and splenic abscess on long-term IV antibiotics, diastolic CHF, PVD s/p bilateral AKA, IDDM-2, morbid obesity and chronic sacral wound presenting with progressive shortness of breath in the setting of missed dialysis and admitted for hypertensive emergency.      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  PVD (peripheral vascular disease) (HCC) History of Bilateral AKA  Class 3 obesity (HCC) BMI >40           Subjective: Sleepy today, no new complaints.     Physical Exam: BP (!) 142/42 (BP Location: Right Arm)   Pulse 64   Temp 98.5 F (36.9 C) (Oral)   Resp 14   Ht 5\' 5"  (1.651 m)   Wt 119.3 kg   SpO2 100%   BMI 43.77 kg/m   Obese adult female, no acute distress Sleepy RRR no murmurs, no peripheral edema Lungs clear, no rales or wheezes    Data Reviewed: None new  Family Communication: None present    Disposition: Status is: Inpatient Medically ready for discharge when  bed available.        Author: Edwin Dada, MD 04/05/2022 7:37 PM  For on call review [email or web address]

## 2022-04-05 NOTE — Progress Notes (Signed)
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    PT Comments    Tolerated transfer training into power wheelchair with min assist. Further education for wheelchair use, pressure relief schedule, w/c management including modes and settings, safety awareness, and navigating power wheelchair with verbal cues through various challenges today. Pt tolerated 30 min upright and is currently reclining in w/c. Spoke with RN about anterior scoot transfer to return to bed. Pt plans to sit up in w/c as long as tolerated up to 4 hours, with a posterior tilt for pressure relief for 10 minutes every 30 minutes. Patient will continue to benefit from skilled physical therapy services to further improve independence with functional mobility.   Recommendations for follow up therapy are one component of a multi-disciplinary discharge planning process, led by the attending physician.  Recommendations may be updated based on patient status, additional functional criteria and insurance authorization.  Follow Up Recommendations  Skilled nursing-short term rehab (<3 hours/day) Can patient physically be transported by private vehicle: No   Assistance Recommended at Discharge Intermittent Supervision/Assistance  Patient can return home with the following A lot of help with walking and/or transfers;A lot of help with bathing/dressing/bathroom;Assist for transportation;Help with stairs or ramp for entrance   Equipment Recommendations  Wheelchair (measurements PT);Wheelchair cushion (measurements  PT);Other (comment);Hospital bed (more accomodating mechanical lift, with sling that handles pt's with bil AKAs; Roho cushion (20x24), power wheelchair with power tilt and recline)    Recommendations for Other Services       Precautions / Restrictions Precautions Precautions: Fall Precaution Comments: bil AKA, 2L O2 at baseline (has been tolerating mobility in power chair on [employer] fine); when in power chair, MUST perform pressure relief every 30 minutes by tilting back for 10 minutes Restrictions Other Position/Activity Restrictions: when in power chair, MUST perform pressure relief every 30 minutes by tilting back for 10 minutes     Mobility  Bed Mobility Overal bed mobility: Modified Independent             General bed mobility comments: extra time, no assist    Transfers Overall transfer level: Needs assistance Equipment used: None (bed pad) Transfers: Bed to chair/wheelchair/BSC         Anterior-Posterior transfers: Min assist   General transfer comment: min assist to guide bed pad along surface to reduce friction over buttock. Good UE strength, cues for sequencing into chair, aligns well.    Ambulation/Gait               General Gait Details: unable   Theme park manager mobility: Yes Wheelchair propulsion:  (Power) Wheelchair parts: Supervision/cueing Distance: Psychologist, sport and exercise Details (indicate cue type and reason): Reviewed modes on chair, set-up, safety measures, use of seat belt, alignment and pressure relief schedule. Practiced 180, 360 degree turns in place, and wide turns with forward momentum. Backwards driving 10 feet x2, navigating around obstacles and in semi congested areas. Required cues for scanning enviornment and stopping in doorways to make sure clear of traffic  and space with margin for error around arm rests.  Modified Rankin (Stroke Patients Only)        Balance Overall balance assessment: Mild deficits observed, not formally tested Sitting-balance support: No upper extremity supported Sitting balance-[name] Scale: Good                                      Cognition Arousal/Alertness: Awake/alert Behavior During Therapy: WFL for tasks assessed/performed Overall Cognitive Status: Within Functional Limits for tasks assessed                                          Exercises      General Comments        Pertinent Vitals/Pain Pain Assessment Pain Assessment: Faces Faces Pain Scale: Hurts little more Pain Location: abdomen wounds Pain Descriptors / Indicators: Discomfort Pain Intervention(s): Monitored during session, RN gave pain meds during session    Home Living                          Prior Function            PT Goals (current goals can now be found in the care plan section) Acute Rehab PT Goals PT Goal Formulation: With patient Time For Goal Achievement: 04/19/22 Potential to Achieve Goals: Good Progress towards PT goals: Progressing toward goals    Frequency    Min 2X/week      PT Plan Current plan remains appropriate    Co-evaluation              AM-PAC PT "6 Clicks" Mobility   Outcome Measure  Help needed turning from your back to your side while in a flat bed without using bedrails?: None Help needed moving from lying on your back to sitting on the side of a flat bed without using bedrails?: None Help needed moving to and from a bed to a chair (including a wheelchair)?: A Little Help needed standing up from a chair using your arms (e.g., wheelchair or bedside chair)?: Total Help needed to walk in hospital room?: Total Help needed climbing 3-5 steps with a railing? : Total 6 Click Score: 14    End of Session Equipment Utilized During Treatment:  (bed pads) Activity Tolerance: Patient tolerated treatment well Patient left: in chair;with call  bell/phone within reach;with family/visitor present (in power chair, reclined, power-off) Nurse Communication: Mobility status;Other (comment) (A/p transfers back to bed.) PT Visit Diagnosis: Other abnormalities of gait and mobility (R26.89);Muscle weakness (generalized) (M62.81) Pain - Right/Left: Right Pain - part of body: Hip;Leg (ischial tuberosity; R abdominal nodular pain)     Time: 2993-7169 PT Time Calculation (min) (ACUTE ONLY): 45 min  Charges:  $Therapeutic Activity: 8-22 mins $Self Care/Home Management: 8-22 $Wheel Chair Management: 8-22 mins                     Candie Mile, PT, DPT Physical Therapist Acute Rehabilitation Services [place] 04/05/2022, 4:27 PM

## 2022-04-05 NOTE — Progress Notes (Signed)
[facility]  Patient will not be seen by nephrology team today. Please page with any concerns.   Anice Paganini, PA-C 04/05/2022, 12:31 PM  [facility] Pager: [phone]

## 2022-04-06 DIAGNOSIS — I5023 Acute on chronic systolic (congestive) heart failure: Secondary | ICD-10-CM | POA: Diagnosis not present

## 2022-04-06 LAB — RENAL FUNCTION PANEL
Albumin: 2.9 g/dL — ABNORMAL LOW (ref 3.5–5.0)
Anion gap: 15 (ref 5–15)
BUN: 49 mg/dL — ABNORMAL HIGH (ref 6–20)
CO2: 23 mmol/L (ref 22–32)
Calcium: 9.4 mg/dL (ref 8.9–10.3)
Chloride: 101 mmol/L (ref 98–111)
Creatinine, Ser: 10.08 mg/dL — ABNORMAL HIGH (ref 0.44–1.00)
GFR, Estimated: 4 mL/min — ABNORMAL LOW (ref >60)
Glucose, Bld: 134 mg/dL — ABNORMAL HIGH (ref 70–99)
Phosphorus: 5.7 mg/dL — ABNORMAL HIGH (ref 2.5–4.6)
Potassium: 5.1 mmol/L (ref 3.5–5.1)
Sodium: 139 mmol/L (ref 135–145)

## 2022-04-06 LAB — CBC
HCT: 27.2 % — ABNORMAL LOW (ref 36.0–46.0)
Hemoglobin: 8.1 g/dL — ABNORMAL LOW (ref 12.0–15.0)
MCH: 28.3 pg (ref 26.0–34.0)
MCHC: 29.8 g/dL — ABNORMAL LOW (ref 30.0–36.0)
MCV: 95.1 fL (ref 80.0–100.0)
Platelets: 213 10*3/uL (ref 150–400)
RBC: 2.86 MIL/uL — ABNORMAL LOW (ref 3.87–5.11)
RDW: 17.1 % — ABNORMAL HIGH (ref 11.5–15.5)
WBC: 8.6 10*3/uL (ref 4.0–10.5)
nRBC: 0 % (ref 0.0–0.2)

## 2022-04-06 MED ORDER — HEPARIN SODIUM (PORCINE) 1000 UNIT/ML DIALYSIS
20.0000 [IU]/kg | INTRAMUSCULAR | Status: DC | PRN
  Administered 2022-04-06: 2400 [IU] via INTRAVENOUS_CENTRAL
  Filled 2022-04-06 (×2): qty 3

## 2022-04-06 NOTE — Progress Notes (Signed)
Patient taken for HD at this time report was given to RN.  No s/s of distress patient given prn oxy before leaving.

## 2022-04-06 NOTE — Progress Notes (Signed)
Received patient in bed to unit.  Alert and oriented.  Informed consent signed and in chart.   Treatment initiated: 1354 Treatment completed: 1713  Patient tolerated well.  Transported back to the room  Alert, without acute distress.  Hand-off given to patient's nurse.   Access used: L IJ Access issues: None  Medication(s) given: Na Thiosulfate 12.5g IV; Midodrine 10mg  PO. Post HD weight: unable to weigh patient.   [facility]   04/06/22 1713  Vitals  Temp 98.1 F (36.7 C)  BP 136/61  Pulse Rate 67  Resp 13  Oxygen Therapy  SpO2 99 %  O2 Device Nasal Cannula  O2 Flow Rate (L/min) 2 L/min  Patient Activity (if Appropriate) In bed  Post Treatment  Dialyzer Clearance Lightly streaked  Duration of HD Treatment -hour(s) 3 hour(s)  Hemodialysis Intake (mL) 0 mL  Liters Processed 72.3  Fluid Removed (mL) 2000 mL  Hemodialysis Catheter Left Internal jugular Double lumen Permanent (Tunneled)  Placement Date/Time: 03/07/22 1105   Serial / Lot #: [id number]  Expiration Date: 09/09/26  Time Out: Correct patient;Correct site;Correct procedure  Maximum sterile barrier precautions: Hand hygiene;Cap;Mask;Sterile gown;Sterile gloves;Large sterile ...  Post treatment catheter status Capped and Clamped  Vitals  Weight  (unable to obtain wt due to bed not being zeroed out, RN aware)

## 2022-04-06 NOTE — Plan of Care (Signed)
  Problem: Health Behavior/Discharge Planning: Goal: Ability to manage health-related needs will improve Outcome: Not Progressing   Problem: Clinical Measurements: Goal: Ability to maintain clinical measurements within normal limits will improve Outcome: Not Progressing Goal: Will remain free from infection Outcome: Not Progressing Goal: Diagnostic test results will improve Outcome: Not Progressing Goal: Respiratory complications will improve Outcome: Not Progressing Goal: Cardiovascular complication will be avoided Outcome: Not Progressing   Problem: Activity: Goal: Risk for activity intolerance will decrease Outcome: Not Progressing   Problem: Nutrition: Goal: Adequate nutrition will be maintained Outcome: Not Progressing   Problem: Coping: Goal: Level of anxiety will decrease Outcome: Not Progressing   Problem: Elimination: Goal: Will not experience complications related to bowel motility Outcome: Not Progressing Goal: Will not experience complications related to urinary retention Outcome: Not Progressing   Problem: Pain Managment: Goal: General experience of comfort will improve Outcome: Not Progressing   Problem: Safety: Goal: Ability to remain free from injury will improve Outcome: Not Progressing   Problem: Skin Integrity: Goal: Risk for impaired skin integrity will decrease Outcome: Not Progressing   

## 2022-04-06 NOTE — Progress Notes (Signed)
  Progress Note   Patient: Courtney Gray IFO:[id number] DOB: [birth year] DOA: 11/01/2021     156 DOS: the patient was seen and examined on 04/06/2022        Brief hospital course: 51 year old F with PMH of ESRD on HD TTS, recent hospitalization for bacteremia, hepatic and splenic abscess on long-term IV antibiotics, diastolic CHF, PVD s/p bilateral AKA, IDDM-2, morbid obesity and chronic sacral wound presenting with progressive shortness of breath in the setting of missed dialysis and admitted for hypertensive emergency.      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  PVD (peripheral vascular disease) (HCC) History of Bilateral AKA  Class 3 obesity (HCC) BMI >40   Chronic respiratory failure Due to morbid obesity        Subjective: No new complaints.     Physical Exam: BP 136/61   Pulse 67   Temp 98.1 F (36.7 C)   Resp 13   Ht 5\' 5"  (1.651 m)   Wt 126.4 kg   SpO2 99%   BMI 46.37 kg/m   Obese adult female, no acute distress Sleepy RRR no murmurs, no peripheral edema Lungs clear, no rales or wheezes    Data Reviewed: None new  Family Communication: None present    Disposition: Status is: Inpatient Medically ready for discharge when bed  available.        Author: Edwin Dada, MD 04/06/2022 7:41 PM  For on call review [email or web address].

## 2022-04-07 DIAGNOSIS — I5023 Acute on chronic systolic (congestive) heart failure: Secondary | ICD-10-CM | POA: Diagnosis not present

## 2022-04-07 MED ORDER — ALTEPLASE 2 MG IJ SOLR: 2.0000 mg | Freq: Once | INTRAMUSCULAR | Status: DC | PRN

## 2022-04-07 MED ORDER — PENTAFLUOROPROP-TETRAFLUOROETH EX AERO: 1.0000 | INHALATION_SPRAY | CUTANEOUS | Status: DC | PRN

## 2022-04-07 MED ORDER — LIDOCAINE HCL (PF) 1 % IJ SOLN: 5.0000 mL | INTRAMUSCULAR | Status: DC | PRN

## 2022-04-07 MED ORDER — HEPARIN SODIUM (PORCINE) 1000 UNIT/ML DIALYSIS: 1000.0000 [IU] | INTRAMUSCULAR | Status: DC | PRN

## 2022-04-07 MED ORDER — LIDOCAINE-PRILOCAINE 2.5-2.5 % EX CREA: 1.0000 | TOPICAL_CREAM | CUTANEOUS | Status: DC | PRN

## 2022-04-07 NOTE — Progress Notes (Signed)
  Progress Note   Patient: Courtney Gray JXB:[id number] DOB: 1970-11-11 DOA: 11/01/2021     157 DOS: the patient was seen and examined on 04/07/2022        Brief hospital course: 52 year old F with PMH of ESRD on HD TTS, recent hospitalization for bacteremia, hepatic and splenic abscess on long-term IV antibiotics, diastolic CHF, PVD s/p bilateral AKA, IDDM-2, morbid obesity and chronic sacral wound presenting with progressive shortness of breath in the setting of missed dialysis and admitted for hypertensive emergency.      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  PVD (peripheral vascular disease) (HCC) History of Bilateral AKA  Class 3 obesity (HCC) BMI >40   Chronic respiratory failure Due to morbid obesity        Subjective: No new complaints.     Physical Exam: BP (!) 168/51 (BP Location: Right Arm)   Pulse 63   Temp 98.1 F (36.7 C) (Oral)   Resp 17   Ht 5\' 5"  (1.651 m)   Wt 126.4 kg   SpO2 100%   BMI 46.37 kg/m   Obese adult female, no acute distress Sleepy RRR no murmurs, no peripheral edema Lungs clear, no rales or wheezes    Data Reviewed: None new  Family Communication: None present    Disposition: Status is: Inpatient Medically  ready for discharge when bed available.        Author: Edwin Dada, MD 04/07/2022 6:13 PM  For on call review [email or web address].

## 2022-04-07 NOTE — Plan of Care (Signed)
  Problem: Clinical Measurements: Goal: Ability to maintain clinical measurements within normal limits will improve Outcome: Progressing   Problem: Nutrition: Goal: Adequate nutrition will be maintained Outcome: Progressing   Problem: Coping: Goal: Level of anxiety will decrease Outcome: Progressing   Problem: Skin Integrity: Goal: Risk for impaired skin integrity will decrease Outcome: Progressing   Problem: Activity: Goal: Risk for activity intolerance will decrease Outcome: Not Progressing   Problem: Pain Managment: Goal: General experience of comfort will improve Outcome: Not Progressing

## 2022-04-07 NOTE — Progress Notes (Signed)
Patient has a CHG allergy but she has had a bed bath today.

## 2022-04-07 NOTE — Progress Notes (Addendum)
[facility] Progress Note   Subjective:    Seen and examined patient at bedside. Feeling tired but otherwise no acute complaints. Tolerated yesterday HD with net UF 2L. Next HD 04/08/22 per routine schedule.  Objective Vitals:   04/06/22 1713 04/06/22 2038 04/07/22 0603 04/07/22 0946  BP: 136/61 (!) 163/68 (!) 151/70 (!) 160/67  Pulse: 67 68 65 72  Resp: _0 Temp: 98.1 F (36.7 C) 98.2 F (36.8 C) 98.1 F (36.7 C) 98.2 F (36.8 C)  TempSrc:  Oral Oral Oral  SpO2: 99% 96% 100% 98%  Weight:      Height:       Physical Exam General: Chronically ill appearing female, alert and in NAD Heart: RRR, 2/6 systolic murmur Lungs: CTA bilaterally Abdomen: Obese, non-distended, +BS Extremities: No stump/hip edema Dialysis Access: R IJ [facility]  Filed Weights   04/06/22 1352  Weight: 126.4 kg    Intake/Output Summary (Last 24 hours) at 04/07/2022 1018 Last data filed at 04/07/2022 0600 Gross per 24 hour  Intake 760 ml  Output 2000 ml  Net -1240 ml    Additional Objective Labs: Basic Metabolic Panel: Recent Labs  Lab 04/01/22 2330 04/06/22 1353  NA 138 139  K 3.4* 5.1  CL 96* 101  CO2 29 23  GLUCOSE 70 134*  BUN 18 49*  CREATININE 4.84* 10.08*  CALCIUM 8.7* 9.4  PHOS  --  5.7*   Liver Function Tests: Recent Labs  Lab 04/01/22 2330 04/06/22 1353  AST 25  --   ALT 9  --   ALKPHOS 52  --   BILITOT 0.7  --   PROT 7.5  --   ALBUMIN 3.0* 2.9*   No results for input(s): "LIPASE", "AMYLASE" in the last 168 hours. CBC: Recent Labs  Lab 04/01/22 2330 04/06/22 1353  WBC 9.2 8.6  HGB 8.5* 8.1*  HCT 27.7* 27.2*  MCV 93.0 95.1  PLT 213 213   Blood Culture    Component Value Date/Time   SDES BLOOD LEFT ANTECUBITAL 10/16/2021 1534   SPECREQUEST  10/16/2021 1534    BOTTLES DRAWN AEROBIC AND ANAEROBIC Blood Culture results may not be optimal due to an inadequate volume of blood received in culture bottles   CULT  10/16/2021 1534    NO GROWTH 5  DAYS Performed at [facility], [place] [address] [place], [place] [address]    REPTSTATUS 10/21/2021 FINAL 10/16/2021 1534    Cardiac Enzymes: No results for input(s): "CKTOTAL", "CKMB", "CKMBINDEX", "TROPONINI" in the last 168 hours. CBG: No results for input(s): "GLUCAP" in the last 168 hours. Iron Studies: No results for input(s): "IRON", "TIBC", "TRANSFERRIN", "FERRITIN" in the last 72 hours. Lab Results  Component Value Date   INR 1.3 (H) 10/09/2021   INR 1.3 (H) 09/10/2021   INR 1.3 (H) 07/10/2021   Studies/Results: No results found.  Medications:  sodium thiosulfate 12.5 g in sodium chloride 0.9 % 150 mL Infusion for Calciphylaxis Stopped (04/06/22 1700)    aspirin EC  81 mg Oral Daily   atorvastatin  10 mg Oral Daily   Chlorhexidine Gluconate Cloth  6 each Topical Q0600   darbepoetin (ARANESP) injection - DIALYSIS  200 mcg Subcutaneous Q Tue-1800   docusate sodium  100 mg Oral BID   famotidine  20 mg Oral Daily   feeding supplement  1 Container Oral TID BM   gabapentin  100 mg Oral Q1200   gabapentin  100 mg Oral QHS  Gerhardt's butt cream   Topical BID   levothyroxine  100 mcg Oral Q0600   midodrine  10 mg Oral Once per day on Tue Thu Sat   pantoprazole  40 mg Oral Daily   pregabalin  25 mg Oral Daily   sevelamer carbonate  4.8 g Oral TID WC   sucroferric oxyhydroxide  500 mg Oral TID WC    Assessment/Plan: Inability to obtain HD at OP center D/T body habitus and wound: Difficult situation with no clear answers yet. PT working with pt -  met with seating specialist to get evaluated for custom chair/cushion that will allow her sit for longer periods.  Appreciate efforts from PT team.  ESRD: Usual TTS schedule - last HD 12/31 (more likely d/t high census and low staff), was refusing HD at times but will be back on schedule, next HD 04/08/22. HD access: new TDC placed 02/04/2022, TDC stopped working again 11/30. IR consulted - S/p venogram, angioplasty of SVC and  innominate veins, and TDC exchange. No recent issues reported.  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 Started empirically on sodium thiosulfate, but caused vomiting so stopped - now getting 1/2 dose - 12.58m q HD, refuses increased dose. Not on any calcium based products or vitamin D. Needs better control of Phos - have added Velphoro as well. HypoTN/volume: On midodrine 114mqHD, with 1060mrn mid HD w/parameters to keep SBP >100 d/t becoming symptomatic when it drops lower. Significant weight loss this admit. May need to try and push UF next treatment  Severe ischial sacral decubitus wound: CT showing chronic osteomyelitis. Limiting her, noted to be slow healing. Goal to tolerate sitting upright - which she is doing with PT.  ID started doxycycline which caused diarrhea - now off.  COPD/Chronic O2 use: Prior O2 at home (5L/min) -> tapered to 2-3L/min here Anemia of ESRD: on max dose Aranesp 200m72meekly. Recent Fe load completed. Hgb drifted down to 6.9,  received 1 unit PRBC, no stable at 8.5. check iron stores again and act as needed  Secondary hyperparathyroidism: Phos 6.8- trended back up. On two binders. CorrCa elevated. Not on VDRA, last PTH 86 (low) on 03/11/22. Avoid all calcium based medications for Vitamin D d/t concern for calciphylaxis.  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 Nutrition - diet continues to be changed from renal.  Currently on regular diet.  Would really prefer she be on a renal diet but patient has been refusing Disposition: Difficult situation. Prior to admit, patient required 6 staff members and 2 hoyer pads for transfer into HD chair. Could not stay on HD for full tx. 2/2 pain of multiple decubitus ulcers and PTAR unable to lift pt into ambulance for transport.SW working on LTC at SNF.Con-wayll need to tolerate 5-6hrs in a chair to tolerate OP dialysis  including transport, treatment and cannulation time.  The main issue is her Rt ischial wound. PT has arranged seating specialist with plan for custom power chair/cushion that would keep pressure off wounds and allow her to sit for longer periods   CourTobie Poet [place]/2024,10:18 AM  LOS: 157 days   Patient seen and examined, agree with above note with above modifications. Very sad situation with no easy solutions-  continue with routine HD with appropriate titration of HD related meds  KellCorliss Parish 04/07/2022

## 2022-04-08 DIAGNOSIS — I5023 Acute on chronic systolic (congestive) heart failure: Secondary | ICD-10-CM | POA: Diagnosis not present

## 2022-04-08 MED ORDER — PREGABALIN 50 MG PO CAPS
50.0000 mg | ORAL_CAPSULE | Freq: Every day | ORAL | Status: DC
  Administered 2022-04-09 – 2022-05-15 (×36): 50 mg via ORAL
  Filled 2022-04-08 (×39): qty 1

## 2022-04-08 MED ORDER — HEPARIN SODIUM (PORCINE) 1000 UNIT/ML IJ SOLN
INTRAMUSCULAR | Status: AC
  Administered 2022-04-08: 1000 [IU]
  Filled 2022-04-08: qty 4

## 2022-04-08 NOTE — Progress Notes (Signed)
Received patient in bed to unit.  Alert and oriented.  Informed consent signed and in chart.   Treatment initiated: 1710 Treatment completed: 2040  Patient tolerated well.  Transported back to the room  Alert, without acute distress.  Hand-off given to patient's nurse.   Access used: catheter Access issues: none  Total UF removed: 2.7 L Medication(s) given: oxycodone 5 mg PO Post HD VS: 130/60 P 60 R 14 Post HD weight: 117 kg   [facility]

## 2022-04-08 NOTE — Progress Notes (Signed)
  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  PVD (peripheral vascular disease) (HCC) History of Bilateral AKA  Class 3 obesity (HCC) BMI >40   Chronic respiratory failure Due to morbid obesity        Subjective: No new complaints.     Physical Exam: BP (!) 165/76 (BP Location: Right Wrist)   Pulse 65   Temp (!) 97.5 F (36.4 C) (Oral)   Resp 11   Ht 5\' 5"  (1.651 m)   Wt 119.5 kg   SpO2 99%   BMI 43.84 kg/m   Obese adult female, no acute distress Sleepy RRR no murmurs, no peripheral edema Lungs clear, no rales or wheezes    Data Reviewed: None new  Family Communication: None present    Disposition: Status is: Inpatient Medically ready for discharge when bed available.        Author: Edwin Dada, MD 04/08/2022 5:46 PM  For on call  review [email or web address].

## 2022-04-08 NOTE — Progress Notes (Signed)
PT Cancellation Note  Patient Details Name: Courtney Gray MRN: [id number] DOB: February 02, 1971   Cancelled Treatment:    Reason Eval/Treat Not Completed: Other (comment)  10:45 -- Pt just received pain meds, wants to nap; asked PT to return at 1:30pm 1:36 -- Pt politely declining PT, too sleepy from pain meds;   Discussed need for PT and continuing work on independence with functional mobility and the need for more time and practice in the power chair; Pt asks for PT tomorrow earlier, will aim to see her around 9:30am;   Roney Marion, [place] 04/08/2022, 2:47 PM

## 2022-04-08 NOTE — TOC Progression Note (Signed)
Transition of Care [facility]) - Progression Note    Patient Details  Name: Courtney Gray MRN: [id number] Date of Birth: 07/04/1970  Transition of Care [facility]) CM/SW [place], LCSW Phone Number: 04/08/2022, 8:38 AM  Clinical Narrative:    TOC continuing to follow. Barriers to safe discharge are still in place.    Expected Discharge Plan: Long Term Nursing Home Barriers to Discharge: No SNF bed, Transportation, Waiting for outpatient dialysis  Expected Discharge Plan and Services       Living arrangements for the past 2 months: Single Family Home                                       Social Determinants of Health (SDOH) Interventions SDOH Screenings   Food Insecurity: No Food Insecurity (12/11/2021)  Housing: Low Risk  (12/11/2021)  Transportation Needs: No Transportation Needs (12/11/2021)  Utilities: At Risk (12/11/2021)  Tobacco Use: Low Risk  (03/07/2022)    Readmission Risk Interventions    09/16/2021    4:36 PM 08/28/2021   11:50 AM 07/12/2021   12:38 PM  Readmission Risk Prevention Plan  Transportation Screening Complete Complete Complete  Medication Review Press photographer)  Complete Complete  PCP or Specialist appointment within 3-5 days of discharge  Complete Complete  HRI or Home Care Consult Complete Complete Complete  SW Recovery Care/Counseling Consult Complete Complete Complete  Palliative Care Screening  Not Applicable Not Applicable  Skilled Nursing Facility Complete Not Applicable Complete

## 2022-04-08 NOTE — Progress Notes (Addendum)
[facility] Progress Note   Subjective:    Seen and examined patient at bedside. No acute complaints. Plan for HD today.  Objective Vitals:   04/07/22 1748 04/07/22 2106 04/08/22 0500 04/08/22 0642  BP: (!) 168/51 (!) 157/84  (!) 184/100  Pulse: 63 77  77  Resp: _0 Temp: 98.1 F (36.7 C) 98.2 F (36.8 C)  (!) 97.3 F (36.3 C)  TempSrc: Oral Oral  Oral  SpO2: 100% 99%  100%  Weight:  119.5 kg 119.5 kg   Height:       Physical Exam General: Chronically ill appearing female, alert and in NAD Heart: RRR, 2/6 systolic murmur Lungs: CTA bilaterally Abdomen: Obese, non-distended, +BS Extremities: No stump/hip edema Dialysis Access: R [facility]  Filed Weights   04/06/22 1352 04/07/22 2106 04/08/22 0500  Weight: 126.4 kg 119.5 kg 119.5 kg    Intake/Output Summary (Last 24 hours) at 04/08/2022 1032 Last data filed at 04/08/2022 0645 Gross per 24 hour  Intake 477 ml  Output 0 ml  Net 477 ml    Additional Objective Labs: Basic Metabolic Panel: Recent Labs  Lab 04/01/22 2330 04/06/22 1353  NA 138 139  K 3.4* 5.1  CL 96* 101  CO2 29 23  GLUCOSE 70 134*  BUN 18 49*  CREATININE 4.84* 10.08*  CALCIUM 8.7* 9.4  PHOS  --  5.7*   Liver Function Tests: Recent Labs  Lab 04/01/22 2330 04/06/22 1353  AST 25  --   ALT 9  --   ALKPHOS 52  --   BILITOT 0.7  --   PROT 7.5  --   ALBUMIN 3.0* 2.9*   No results for input(s): "LIPASE", "AMYLASE" in the last 168 hours. CBC: Recent Labs  Lab 04/01/22 2330 04/06/22 1353  WBC 9.2 8.6  HGB 8.5* 8.1*  HCT 27.7* 27.2*  MCV 93.0 95.1  PLT 213 213   Blood Culture    Component Value Date/Time   SDES BLOOD LEFT ANTECUBITAL 10/16/2021 1534   SPECREQUEST  10/16/2021 1534    BOTTLES DRAWN AEROBIC AND ANAEROBIC Blood Culture results may not be optimal due to an inadequate volume of blood received in culture bottles   CULT  10/16/2021 1534    NO GROWTH 5 DAYS Performed at [facility], [place]  [address], [place], [place] [address]    REPTSTATUS 10/21/2021 FINAL 10/16/2021 1534    Cardiac Enzymes: No results for input(s): "CKTOTAL", "CKMB", "CKMBINDEX", "TROPONINI" in the last 168 hours. CBG: No results for input(s): "GLUCAP" in the last 168 hours. Iron Studies: No results for input(s): "IRON", "TIBC", "TRANSFERRIN", "FERRITIN" in the last 72 hours. Lab Results  Component Value Date   INR 1.3 (H) 10/09/2021   INR 1.3 (H) 09/10/2021   INR 1.3 (H) 07/10/2021   Studies/Results: No results found.  Medications:  sodium thiosulfate 12.5 g in sodium chloride 0.9 % 150 mL Infusion for Calciphylaxis Stopped (04/06/22 1700)    aspirin EC  81 mg Oral Daily   atorvastatin  10 mg Oral Daily   Chlorhexidine Gluconate Cloth  6 each Topical Q0600   darbepoetin (ARANESP) injection - DIALYSIS  200 mcg Subcutaneous Q Tue-1800   docusate sodium  100 mg Oral BID   famotidine  20 mg Oral Daily   feeding supplement  1 Container Oral TID BM   gabapentin  100 mg Oral Q1200   gabapentin  100 mg Oral QHS   Gerhardt's butt cream  Topical BID   levothyroxine  100 mcg Oral Q0600   midodrine  10 mg Oral Once per day on Tue Thu Sat   pantoprazole  40 mg Oral Daily   pregabalin  25 mg Oral Daily   sevelamer carbonate  4.8 g Oral TID WC   sucroferric oxyhydroxide  500 mg Oral TID WC    Assessment/Plan: Inability to obtain HD at OP center D/T body habitus and wound: Difficult situation with no clear answers yet. PT working with pt -  met with seating specialist to get evaluated for custom chair/cushion that will allow her sit for longer periods.  Appreciate efforts from PT team.  ESRD: Usual TTS schedule - last HD 12/31 (more likely d/t high census and low staff), was refusing HD at times but will be back on schedule, next HD 04/08/22. HD access: new TDC placed 02/04/2022, TDC stopped working again 11/30. IR consulted - S/p venogram, angioplasty of SVC and innominate veins, and TDC exchange. No recent  issues reported.  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 Started empirically on sodium thiosulfate, but caused vomiting so stopped - now getting 1/2 dose - 12.43m q HD, refuses increased dose. Not on any calcium based products or vitamin D. Needs better control of Phos - have added Velphoro as well. HypoTN/volume: On midodrine 134mqHD, with 10100mrn mid HD w/parameters to keep SBP >100 d/t becoming symptomatic when it drops lower. Significant weight loss this admit. Bps are now up so will to try to push UF slightly with HD today (UFG set 2.5-3L). Severe ischial sacral decubitus wound: CT showing chronic osteomyelitis. Limiting her, noted to be slow healing. Goal to tolerate sitting upright - which she is doing with PT.  ID started doxycycline which caused diarrhea - now off.  COPD/Chronic O2 use: Prior O2 at home (5L/min) -> tapered to 2-3L/min here Anemia of ESRD: on max dose Aranesp 200m11meekly. Recent Fe load completed. Hgb drifted down to 6.9,  received 1 unit PRBC, no stable at 8.5. Checking iron studies with HD today. Secondary hyperparathyroidism: Phos 6.8- trended back up. On two binders. CorrCa elevated. Not on VDRA, last PTH 86 (low) on 03/11/22. Avoid all calcium based medications for Vitamin D d/t concern for calciphylaxis.  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 Nutrition - diet continues to be changed from renal.  Currently on regular diet.  Would really prefer she be on a renal diet but patient has been refusing Disposition: Difficult situation. Prior to admit, patient required 6 staff members and 2 hoyer pads for transfer into HD chair. Could not stay on HD for full tx. 2/2 pain of multiple decubitus ulcers and PTAR unable to lift pt into ambulance for transport.SW working on LTC at SNF.Con-wayll need to tolerate 5-6hrs in a chair to tolerate OP dialysis including  transport, treatment and cannulation time.  The main issue is her Rt ischial wound. PT has arranged seating specialist with plan for custom power chair/cushion that would keep pressure off wounds and allow her to sit for longer periods    CourTobie Poet [place]ice/2024,10:32 AM  LOS: 158 days   Patient seen and examined, agree with above note with above modifications. No new issues-  is happy with her new power chair-  is it possible we can get to a situation where she could get back and forth to dialysis by some  means ?  Continue with routine HD and appropriate titration of HD related meds  Corliss Parish, MD 04/08/2022

## 2022-04-09 DIAGNOSIS — I5023 Acute on chronic systolic (congestive) heart failure: Secondary | ICD-10-CM | POA: Diagnosis not present

## 2022-04-09 MED ORDER — LIDOCAINE-PRILOCAINE 2.5-2.5 % EX CREA: 1.0000 | TOPICAL_CREAM | CUTANEOUS | Status: DC | PRN

## 2022-04-09 MED ORDER — HEPARIN SODIUM (PORCINE) 1000 UNIT/ML DIALYSIS
1000.0000 [IU] | INTRAMUSCULAR | Status: DC | PRN
  Administered 2022-04-11: 4200 [IU] via INTRAVENOUS_CENTRAL
  Filled 2022-04-09 (×2): qty 1

## 2022-04-09 MED ORDER — ALTEPLASE 2 MG IJ SOLR: 2.0000 mg | Freq: Once | INTRAMUSCULAR | Status: DC | PRN

## 2022-04-09 MED ORDER — LIDOCAINE HCL (PF) 1 % IJ SOLN: 5.0000 mL | INTRAMUSCULAR | Status: DC | PRN

## 2022-04-09 MED ORDER — PENTAFLUOROPROP-TETRAFLUOROETH EX AERO: 1.0000 | INHALATION_SPRAY | CUTANEOUS | Status: DC | PRN

## 2022-04-09 NOTE — Progress Notes (Signed)
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    PT Comments    Continuing work on functional mobility and activity tolerance;  Session focused on functional transfers, power chair management, and pressure relief/positioning ed/reinforcement; Courtney Gray shows good use of power chair positioning to aid in ant post transfer bed to chair; Min assist needed to secure bed pad against pt's body, limiting friction forces during reciprocal scooting; Able to verbalize need to tilt every 30 minutes for pressure relief;   Before session, Courtney Gray, Mobility Specialist,  set the three memory position settings on the power chair:  M1 -- Full supine M2 -- Drive position (which can be tailored to Courtney Gray's preference) M3 -- Tilt position  Courtney Gray showed ability to guide floor staff in how to go through drive and positioning modes on control console as well;   Up in power chair at 10 am; Navigated halls on 47M with this PT, then ended session with pt visiting Renal Education Nurse Specialist;   Lengthy discussion with Courtney Gray, TOC SW; At this time, pt's option for rehab at SNF are VERY limited; with equipment that meets her needs, and full participation in PT here acutely, Courtney Gray to a private residence isn't unreasonable; would recommend consistent intermittent assist (pt's daughter is very helpful), and medical Courtney Gray Lei transport to [place] HD; would need to be able to transfer to an HD chair that fits at HD center -- worth considering trying HD in the power chair (per the vendor, it has  been done with success), to minimize shearing, friction, pressure on R ischial wound; will follow up with all involved;   TOC SW is working diligently to assist with housing options.  Recommendations for follow up therapy are one component of a multi-disciplinary discharge planning process, led by the attending physician.  Recommendations may be updated based on patient status, additional functional criteria and insurance authorization.  Follow Up Recommendations  Skilled nursing-short term rehab (<3 hours/day) Can patient physically be transported by private vehicle: No (But we can consider wheelchair Courtney Gray Lei transport)   Assistance Recommended at Discharge Intermittent Supervision/Assistance  Patient can return home with the following A little help with walking and/or transfers;Help with stairs or ramp for entrance;Assist for transportation (Medical Courtney Gray Lei transport)   Financial risk analyst (measurements PT);Wheelchair cushion (measurements PT);Other (comment);Hospital bed (more accomodating mechanical lift, with sling that handles pt's with bil AKAs; Roho cushion (20x24), power wheelchair with power tilt and recline)    Recommendations for Other Services       Precautions / Restrictions Precautions Precautions: Fall Precaution Comments: bil AKA, 2L O2 at baseline (has been tolerating mobility in power chair on [employer] fine); when in power chair, MUST perform pressure relief every 30 minutes by tilting back for 10 minutes Restrictions Other Position/Activity Restrictions: when in power chair, MUST perform pressure relief every 30 minutes by tilting back for 10 minutes     Mobility  Bed Mobility Overal bed mobility: Modified Independent Bed Mobility: Rolling Rolling: Modified independent (Device/Increase time)   Supine to sit: Modified independent (Device/Increase time) Sit to supine: Modified independent (Device/Increase time)  General bed mobility comments: extra time,  no assist; uses bedrails    Transfers Overall transfer level: Needs assistance Equipment used: None (bed pad) Transfers: Bed to chair/wheelchair/BSC         Anterior-Posterior transfers: Min assist   General transfer comment: Discussed power chair positioning prior to transfer including slight tilt backwards to help with ant ptost scoot into chair; min assist to guide bed pad along surface to reduce friction over buttock. Good UE strength, cues for sequencing into chair, aligns well.    Ambulation/Gait                   Theme park manager mobility: Yes Wheelchair propulsion:  (Power) Wheelchair parts: Supervision/cueing Distance:  (on the unit) Wheelchair Assistance Details (indicate cue type and reason): Reviewed modes on chair, set-up, safety measures, use of seat belt, alignment and pressure relief schedule. Practiced navigating around obstacles and in semi congested areas. showed carryover of education for scanning enviornment and stopping in doorways to make sure clear of traffic and space with margin for error around arm rests  Modified Rankin (Stroke Patients Only)       Balance     Sitting balance-[name] Scale: Good                                      Cognition Arousal/Alertness: Awake/alert Behavior During Therapy: WFL for tasks assessed/performed Overall Cognitive Status: Within Functional Limits for tasks assessed                                 General Comments: Able to verbalize the need to tilt for pressure relief        Exercises      General Comments General comments (skin integrity, edema, etc.): Discussed and demonstrated transfer back to bed, and discussed with RN; Mob Specialist had set Memory functions on the power chair, and we reviewed them      Pertinent Vitals/Pain Pain Assessment Pain Assessment: Faces Faces Pain Scale: No hurt Pain  Intervention(s): Monitored during session, Other (comment) (discussed possibility of pt getting pain meds after getting in the chair)    Home Living                          Prior Function            PT Goals (current goals can now be found in the care plan section) Acute Rehab PT Goals Patient Stated Goal: Wants to get in the power chair PT Goal Formulation: With patient Time For Goal Achievement: 04/19/22 Potential to Achieve Goals: Good Progress towards PT goals: Progressing toward goals    Frequency    Min 2X/week      PT Plan Current plan remains appropriate    Co-evaluation              AM-PAC PT "6 Clicks" Mobility   Outcome Measure  Help needed turning from your back to your side while in a flat bed without using bedrails?: None Help needed moving from lying on your back to sitting on the side of a flat bed without using bedrails?: None Help needed moving to and from a bed to a chair (including a wheelchair)?: A Little Help  needed standing up from a chair using your arms (e.g., wheelchair or bedside chair)?: Total Help needed to walk in hospital room?: Total Help needed climbing 3-5 steps with a railing? : Total 6 Click Score: 14    End of Session         PT Visit Diagnosis: Other abnormalities of gait and mobility (R26.89);Muscle weakness (generalized) (M62.81) Pain - Right/Left: Right Pain - part of body: Hip;Leg (ischial tuberosity; R abdominal nodular pain)     Time: 7373-6681 PT Time Calculation (min) (ACUTE ONLY): 52 min  Charges:  $Therapeutic Activity: 23-37 mins $Wheel Chair Management: 8-22 mins                     Roney Marion, PT  Acute Rehabilitation Services Office [place] 04/09/2022, 11:35 AM

## 2022-04-09 NOTE — Progress Notes (Addendum)
[facility] Progress Note   Subjective:    Seen and examined patient at bedside. Currently sitting in power chair about to work with PT. No acute complaints. Tolerated yesterday's HD with net UF 2.7L. Bps improving. She reports her blood pressure is high when she is in pain which is continuous. Next HD 04/10/21.  Objective Vitals:   04/08/22 2040 04/08/22 2057 04/08/22 2129 04/09/22 0914  BP: 130/80  (!) 141/73 (!) 114/92  Pulse:   63 70  Resp: _0 Temp: 98 F (36.7 C)  98.1 F (36.7 C) 98.2 F (36.8 C)  TempSrc: Oral  Oral Oral  SpO2: 100%  97% 100%  Weight:  117 kg    Height:       Physical Exam General: Chronically ill appearing female, alert and in NAD Heart: RRR, 2/6 systolic murmur Lungs: CTA bilaterally Abdomen: Obese, non-distended, +BS Extremities: No stump/hip edema Dialysis Access: R [facility]  Filed Weights   04/08/22 0500 04/08/22 1700 04/08/22 2057  Weight: 119.5 kg 119.5 kg 117 kg    Intake/Output Summary (Last 24 hours) at 04/09/2022 1040 Last data filed at 04/08/2022 2300 Gross per 24 hour  Intake 240 ml  Output 2700 ml  Net -2460 ml    Additional Objective Labs: Basic Metabolic Panel: Recent Labs  Lab 04/06/22 1353  NA 139  K 5.1  CL 101  CO2 23  GLUCOSE 134*  BUN 49*  CREATININE 10.08*  CALCIUM 9.4  PHOS 5.7*   Liver Function Tests: Recent Labs  Lab 04/06/22 1353  ALBUMIN 2.9*   No results for input(s): "LIPASE", "AMYLASE" in the last 168 hours. CBC: Recent Labs  Lab 04/06/22 1353  WBC 8.6  HGB 8.1*  HCT 27.2*  MCV 95.1  PLT 213   Blood Culture    Component Value Date/Time   SDES BLOOD LEFT ANTECUBITAL 10/16/2021 1534   SPECREQUEST  10/16/2021 1534    BOTTLES DRAWN AEROBIC AND ANAEROBIC Blood Culture results may not be optimal due to an inadequate volume of blood received in culture bottles   CULT  10/16/2021 1534    NO GROWTH 5 DAYS Performed at [facility], [place] [address], [place],  [place] [address]    REPTSTATUS 10/21/2021 FINAL 10/16/2021 1534    Cardiac Enzymes: No results for input(s): "CKTOTAL", "CKMB", "CKMBINDEX", "TROPONINI" in the last 168 hours. CBG: No results for input(s): "GLUCAP" in the last 168 hours. Iron Studies: No results for input(s): "IRON", "TIBC", "TRANSFERRIN", "FERRITIN" in the last 72 hours. Lab Results  Component Value Date   INR 1.3 (H) 10/09/2021   INR 1.3 (H) 09/10/2021   INR 1.3 (H) 07/10/2021   Studies/Results: No results found.  Medications:  sodium thiosulfate 12.5 g in sodium chloride 0.9 % 150 mL Infusion for Calciphylaxis Stopped (04/06/22 1700)    aspirin EC  81 mg Oral Daily   atorvastatin  10 mg Oral Daily   Chlorhexidine Gluconate Cloth  6 each Topical Q0600   darbepoetin (ARANESP) injection - DIALYSIS  200 mcg Subcutaneous Q Tue-1800   docusate sodium  100 mg Oral BID   famotidine  20 mg Oral Daily   feeding supplement  1 Container Oral TID BM   gabapentin  100 mg Oral QHS   Gerhardt's butt cream   Topical BID   levothyroxine  100 mcg Oral Q0600   midodrine  10 mg Oral Once per day on Tue Thu Sat   pantoprazole  40 mg Oral  Daily   pregabalin  50 mg Oral Daily   sevelamer carbonate  4.8 g Oral TID WC   sucroferric oxyhydroxide  500 mg Oral TID WC    Assessment/Plan: Inability to obtain HD at OP center D/T body habitus and wound: Difficult situation with no clear answers yet. PT working with pt -  met with seating specialist to get evaluated for custom chair/cushion that will allow her sit for longer periods.  Appreciate efforts from PT team.  ESRD: Usual TTS schedule - last HD 12/31 (more likely d/t high census and low staff), was refusing HD at times but will be back on schedule, next HD 04/10/22. HD access: new TDC placed 02/04/2022, TDC stopped working again 11/30. IR consulted - S/p venogram, angioplasty of SVC and innominate veins, and TDC exchange. No recent issues reported.  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 Started empirically on sodium thiosulfate, but caused vomiting so stopped - now getting 1/2 dose - 12.19m q HD, refuses increased dose. Not on any calcium based products or vitamin D. Needs better control of Phos - have added Velphoro as well. HypoTN/volume: On midodrine 156mqHD, with 1038mrn mid HD w/parameters to keep SBP >100 d/t becoming symptomatic when it drops lower. Significant weight loss this admit. Bps improved after yesterday's HD-net UF 2.7L. Patient reports BP goes up when she is in pain which can also contribute. Remains euvolemic on exam. Severe ischial sacral decubitus wound: CT showing chronic osteomyelitis. Limiting her, noted to be slow healing. Goal to tolerate sitting upright - which she is doing with PT.  ID started doxycycline which caused diarrhea - now off.  COPD/Chronic O2 use: Prior O2 at home (5L/min) -> tapered to 2-3L/min here Anemia of ESRD: on max dose Aranesp 200m22meekly. Recent Fe load completed. Hgb drifted down to 6.9,  received 1 unit PRBC, now stable at 8.1. Checking iron studies. Secondary hyperparathyroidism: Phos 6.8- trended back up. On two binders. CorrCa elevated. Not on VDRA, last PTH 86 (low) on 03/11/22. Avoid all calcium based medications for Vitamin D d/t concern for calciphylaxis.  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 Nutrition - diet continues to be changed from renal.  Currently on regular diet.  Would really prefer she be on a renal diet but patient has been refusing Disposition: Difficult situation. Prior to admit, patient required 6 staff members and 2 hoyer pads for transfer into HD chair. Could not stay on HD for full tx. 2/2 pain of multiple decubitus ulcers and PTAR unable to lift pt into ambulance for transport.SW working on LTC at SNF.Con-wayll need to tolerate 5-6hrs in a chair to tolerate OP dialysis including  transport, treatment and cannulation time.  The main issue is her Rt ischial wound. PT has arranged seating specialist with plan for custom power chair/cushion that would keep pressure off wounds and allow her to sit for longer periods    CourTobie Poet Car[place]/2024,10:40 AM  LOS: 159 days     Seen by physician extender.  Agree with note as documented above by physician extender and as noted here.  With ESRD and wounds preventing her from tolerating dialysis in a chair she appears most appropriate for an LTAC rather than a SNF.  LoriClaudia Desanctis 04/09/2022  7:01 PM

## 2022-04-09 NOTE — Progress Notes (Signed)
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    PT Comments    Continuing work on functional mobility and activity tolerance;  Session focused on assisting with the transfer from power chair back to bed; factors like differing movement angles, incr R flank pain at time of transfer, and general fatigue, made the forward transfer back to bed more effortful;   Pt was up in power chair from 10am to approx 3:30 pm   Recommendations for follow up therapy are one component of a multi-disciplinary discharge planning process, led by the attending physician.  Recommendations may be updated based on patient status, additional functional criteria and insurance authorization.  Follow Up Recommendations  Home health PT (Consider PACE services) Can patient physically be transported by private vehicle: No (But we can consider wheelchair [name] transport)   Assistance Recommended at Discharge Intermittent Supervision/Assistance  Patient can return home with the following A little help with walking and/or transfers;Help with stairs or ramp for entrance;Assist for transportation (Medical [name] transport)   Financial risk analyst (measurements PT);Wheelchair cushion (measurements PT);Other (comment);Hospital bed (more accomodating mechanical lift, with sling that handles pt's with bil AKAs; Roho cushion (20x24), power wheelchair with power tilt and recline)    Recommendations for Other Services       Precautions / Restrictions Precautions Precautions: Fall Precaution  Comments: bil AKA, 2L O2 at baseline (has been tolerating mobility in power chair on [employer] fine); when in power chair, MUST perform pressure relief every 30 minutes by tilting back for 10 minutes Restrictions Other Position/Activity Restrictions: when in power chair, MUST perform pressure relief every 30 minutes by tilting back for 10 minutes     Mobility  Bed Mobility Overal bed mobility: Modified Independent Bed Mobility: Rolling Rolling: Modified independent (Device/Increase time)   Supine to sit: Modified independent (Device/Increase time) Sit to supine: Modified independent (Device/Increase time)   General bed mobility comments: extra time, no assist; uses bedrails    Transfers Overall transfer level: Needs assistance Equipment used:  (bed pad) Transfers: Bed to chair/wheelchair/BSC         Anterior-Posterior transfers: Mod assist, +2 safety/equipment   General transfer comment: Mod assist and cues for technqiue  to scoot forward back to bed; initially with min assist; for last 2 scoots, used bed pad and mod assist    Ambulation/Gait                   Theme park manager mobility: Yes Wheelchair propulsion:  (Power) Wheelchair parts: Supervision/cueing Distance:  (on the unit) Wheelchair Assistance Details (indicate cue type and reason): Reviewed modes on chair, set-up, safety measures, use of seat belt, alignment and pressure relief schedule. Practiced navigating around obstacles and in semi congested areas. showed carryover of education for scanning enviornment and stopping in doorways to make sure clear of traffic and space with margin for error around arm rests  Modified Rankin (Stroke Patients Only)       Balance     Sitting balance-[name] Scale: Good  Cognition Arousal/Alertness: Awake/alert Behavior During Therapy: WFL for tasks  assessed/performed Overall Cognitive Status: Within Functional Limits for tasks assessed                                 General Comments: Able to verbalize the need to tilt for pressure relief        Exercises      General Comments General comments (skin integrity, edema, etc.): Pt able to verbalize to RN why we use the bed pad to help her scoot; Able to direct RN adn other staff in how to use and move the power chair      Pertinent Vitals/Pain Pain Assessment Pain Assessment: Faces Faces Pain Scale: Hurts even more Pain Location: abdomen wounds Pain Descriptors / Indicators: Discomfort, Grimacing Pain Intervention(s): Monitored during session    Home Living                          Prior Function            PT Goals (current goals can now be found in the care plan section) Acute Rehab PT Goals Patient Stated Goal: transfer back to bed PT Goal Formulation: With patient Time For Goal Achievement: 04/19/22 Potential to Achieve Goals: Good Progress towards PT goals: Progressing toward goals    Frequency     (Will consider min 3x/week if solid plan for housing is established)      PT Plan Discharge plan needs to be updated;Frequency needs to be updated (Lenghty discussion with TOC -- they are assisting with housing options, and Team is considering shifting DC focus from SNF for Reahb to home with daughter assist; this is not entirely unreasonable if pt gets mobility equipment she needs)    Co-evaluation              AM-PAC PT "6 Clicks" Mobility   Outcome Measure  Help needed turning from your back to your side while in a flat bed without using bedrails?: None Help needed moving from lying on your back to sitting on the side of a flat bed without using bedrails?: None Help needed moving to and from a bed to a chair (including a wheelchair)?: A Little Help needed standing up from a chair using your arms (e.g., wheelchair or bedside  chair)?: Total Help needed to walk in hospital room?: Total Help needed climbing 3-5 steps with a railing? : Total 6 Click Score: 14    End of Session Equipment Utilized During Treatment:  (bed pads) Activity Tolerance: Patient tolerated treatment well Patient left: in bed;with call bell/phone within reach Nurse Communication: Mobility status PT Visit Diagnosis: Other abnormalities of gait and mobility (R26.89);Muscle weakness (generalized) (M62.81) Pain - Right/Left: Right Pain - part of body: Hip;Leg (ischial tuberosity; R abdominal nodular pain)     Time: 2355-7322 PT Time Calculation (min) (ACUTE ONLY): 17 min  Charges:  $Therapeutic Activity: 8-22 mins                     Roney Marion, PT  Acute Rehabilitation Services Office [place] 04/09/2022, 4:12 PM

## 2022-04-09 NOTE — Progress Notes (Signed)
PROGRESS NOTE    Courtney Gray  JKD:[id number] DOB: 06-16-1970 DOA: 11/01/2021 PCP: Monico Blitz, MD  52 year old F with PMH of ESRD on HD TTS, recent hospitalization for bacteremia, hepatic and splenic abscess on long-term IV antibiotics, diastolic CHF, PVD s/p bilateral AKA, IDDM-2, morbid obesity and chronic sacral wound presenting with progressive shortness of breath in the setting of missed dialysis and admitted for hypertensive emergency.   -Discharge is pending ability to obtain outpatient hemodialysis that can accommodate patient's mobility constraints secondary to decubitus ulcer wound and bilateral AKAs.   Subjective: sitting in a power chair by the Nurses station, smiling, no complaints  Assessment and Plan:  End stage renal disease ([place]) -Nephrology following, HD tomorrow - EPO, MBD managmenet per Nephrology -Unable to find outpatient dialysis center due to body habitus and wound, inability to sit and tolerate full HD -Now working with PT, increasing tolerance sitting in chair -Continue midodrine with HD  Chronic history of sacral decubitus wound with chronic osteomyelitis -Seen by ID, started on doxycycline 11/28 which caused diarrhea, now off all antibiotics -Patient declined antibiotics, due to her concerns about GI side effects, she would like to continue wound care and watchful waiting at this time, doxycycline discontinued 12/1   Calciphylaxis - Na thiosulfate per Nephrology - Continue oxycodone regimen   Hypertension - Continue midodrine on HD days    Phantom limb pain - Continue new Lyrica, titrate up to 50 daily - Reduce gabapentin   Decubitus ulcer of sacral region, stage 3 ([place]) - WOC   Hypothyroidism - Continue levothyroxine   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   PVD (peripheral vascular disease)  ([place]) History of Bilateral AKA   Class 3 obesity (HCC) BMI >40     Chronic respiratory failure Due to morbid obesity  DVT prophylaxis: none Code Status: Full Code Family Communication: None present Disposition Plan: Unknown  Consultants:    Procedures:   Antimicrobials:    Objective: Vitals:   04/08/22 2040 04/08/22 2057 04/08/22 2129 04/09/22 0914  BP: 130/80  (!) 141/73 (!) 114/92  Pulse:   63 70  Resp: 18  18 17   Temp: 98 F (36.7 C)  98.1 F (36.7 C) 98.2 F (36.8 C)  TempSrc: Oral  Oral Oral  SpO2: 100%  97% 100%  Weight:  117 kg    Height:        Intake/Output Summary (Last 24 hours) at 04/09/2022 1318 Last data filed at 04/08/2022 2300 Gross per 24 hour  Intake 240 ml  Output 2700 ml  Net -2460 ml   Filed Weights   04/08/22 0500 04/08/22 1700 04/08/22 2057  Weight: 119.5 kg 119.5 kg 117 kg    Examination:  General exam: Morbidly obese female sitting up in power chair, AAOx3 CVS: S1-S2, regular rhythm Lungs: Decreased at the bases Abdomen: not assessed Extremities: Bilateral AKA Psychiatry:  Mood & affect appropriate.     Data Reviewed:   CBC: Recent Labs  Lab 04/06/22 1353  WBC 8.6  HGB 8.1*  HCT 27.2*  MCV 95.1  PLT 099   Basic Metabolic Panel: Recent Labs  Lab 04/06/22 1353  NA 139  K 5.1  CL 101  CO2 23  GLUCOSE 134*  BUN 49*  CREATININE 10.08*  CALCIUM 9.4  PHOS 5.7*   GFR: Estimated Creatinine Clearance: 8.4 mL/min (A) (by C-G formula based on SCr of 10.08  mg/dL (H)). Liver Function Tests: Recent Labs  Lab 04/06/22 1353  ALBUMIN 2.9*   No results for input(s): "LIPASE", "AMYLASE" in the last 168 hours. No results for input(s): "AMMONIA" in the last 168 hours. Coagulation Profile: No results for input(s): "INR", "PROTIME" in the last 168 hours. Cardiac Enzymes: No results for input(s): "CKTOTAL", "CKMB", "CKMBINDEX", "TROPONINI" in the last 168 hours. BNP (last 3 results) No results for input(s): "PROBNP" in  the last 8760 hours. HbA1C: No results for input(s): "HGBA1C" in the last 72 hours. CBG: No results for input(s): "GLUCAP" in the last 168 hours. Lipid Profile: No results for input(s): "CHOL", "HDL", "LDLCALC", "TRIG", "CHOLHDL", "LDLDIRECT" in the last 72 hours. Thyroid Function Tests: No results for input(s): "TSH", "T4TOTAL", "FREET4", "T3FREE", "THYROIDAB" in the last 72 hours. Anemia Panel: No results for input(s): "VITAMINB12", "FOLATE", "FERRITIN", "TIBC", "IRON", "RETICCTPCT" in the last 72 hours. Urine analysis: No results found for: "COLORURINE", "APPEARANCEUR", "LABSPEC", "PHURINE", "GLUCOSEU", "HGBUR", "BILIRUBINUR", "KETONESUR", "PROTEINUR", "UROBILINOGEN", "NITRITE", "LEUKOCYTESUR" Sepsis Labs: @LABRCNTIP (procalcitonin:4,lacticidven:4)  )No results found for this or any previous visit (from the past 240 hour(s)).   Radiology Studies: No results found.   Scheduled Meds:  aspirin EC  81 mg Oral Daily   atorvastatin  10 mg Oral Daily   Chlorhexidine Gluconate Cloth  6 each Topical Q0600   darbepoetin (ARANESP) injection - DIALYSIS  200 mcg Subcutaneous Q Tue-1800   docusate sodium  100 mg Oral BID   famotidine  20 mg Oral Daily   feeding supplement  1 Container Oral TID BM   gabapentin  100 mg Oral QHS   Gerhardt's butt cream   Topical BID   levothyroxine  100 mcg Oral Q0600   midodrine  10 mg Oral Once per day on Tue Thu Sat   pantoprazole  40 mg Oral Daily   pregabalin  50 mg Oral Daily   sevelamer carbonate  4.8 g Oral TID WC   sucroferric oxyhydroxide  500 mg Oral TID WC   Continuous Infusions:  sodium thiosulfate 12.5 g in sodium chloride 0.9 % 150 mL Infusion for Calciphylaxis Stopped (04/06/22 1700)     LOS: 159 days    Time spent: 70min    Domenic Polite, MD [facility]   04/09/2022, 1:18 PM

## 2022-04-10 NOTE — Progress Notes (Addendum)
[facility] Progress Note   Subjective:    Seen in room. No new complaints. She's excited about having more mobility in her chair. Reports almost up to 4 hours in chair yesterday. Dialysis today.   Objective Vitals:   04/08/22 2129 04/09/22 0914 04/09/22 1737 04/10/22 0605  BP: (!) 141/73 (!) 114/92 (!) 163/54 137/72  Pulse: 63 70 72 79  Resp: 18 17 16 18   Temp: 98.1 F (36.7 C) 98.2 F (36.8 C) 98.2 F (36.8 C) 98 F (36.7 C)  TempSrc: Oral Oral Oral Oral  SpO2: 97% 100% 95% 96%  Weight:      Height:       Physical Exam General: Well appearing, nad  Heart: RRR, 2/6 systolic murmur Lungs: CTA bilaterally Abdomen: Obese, non-distended, +BS Extremities: No stump/hip edema Dialysis Access: R [facility]  Filed Weights   04/08/22 0500 04/08/22 1700 04/08/22 2057  Weight: 119.5 kg 119.5 kg 117 kg    Intake/Output Summary (Last 24 hours) at 04/10/2022 1336 Last data filed at 04/10/2022 0000 Gross per 24 hour  Intake 240 ml  Output 0 ml  Net 240 ml     Additional Objective Labs: Basic Metabolic Panel: Recent Labs  Lab 04/06/22 1353  NA 139  K 5.1  CL 101  CO2 23  GLUCOSE 134*  BUN 49*  CREATININE 10.08*  CALCIUM 9.4  PHOS 5.7*    Liver Function Tests: Recent Labs  Lab 04/06/22 1353  ALBUMIN 2.9*    No results for input(s): "LIPASE", "AMYLASE" in the last 168 hours. CBC: Recent Labs  Lab 04/06/22 1353  WBC 8.6  HGB 8.1*  HCT 27.2*  MCV 95.1  PLT 213    Blood Culture    Component Value Date/Time   SDES BLOOD LEFT ANTECUBITAL 10/16/2021 1534   SPECREQUEST  10/16/2021 1534    BOTTLES DRAWN AEROBIC AND ANAEROBIC Blood Culture results may not be optimal due to an inadequate volume of blood received in culture bottles   CULT  10/16/2021 1534    NO GROWTH 5 DAYS Performed at [facility], [place] [address] [place], [place] [address]    REPTSTATUS 10/21/2021 FINAL 10/16/2021 1534    Cardiac Enzymes: No results for input(s):  "CKTOTAL", "CKMB", "CKMBINDEX", "TROPONINI" in the last 168 hours. CBG: No results for input(s): "GLUCAP" in the last 168 hours. Iron Studies: No results for input(s): "IRON", "TIBC", "TRANSFERRIN", "FERRITIN" in the last 72 hours. Lab Results  Component Value Date   INR 1.3 (H) 10/09/2021   INR 1.3 (H) 09/10/2021   INR 1.3 (H) 07/10/2021   Studies/Results: No results found.  Medications:  sodium thiosulfate 12.5 g in sodium chloride 0.9 % 150 mL Infusion for Calciphylaxis Stopped (04/06/22 1700)    aspirin EC  81 mg Oral Daily   atorvastatin  10 mg Oral Daily   Chlorhexidine Gluconate Cloth  6 each Topical Q0600   darbepoetin (ARANESP) injection - DIALYSIS  200 mcg Subcutaneous Q Tue-1800   docusate sodium  100 mg Oral BID   famotidine  20 mg Oral Daily   feeding supplement  1 Container Oral TID BM   gabapentin  100 mg Oral QHS   Gerhardt's butt cream   Topical BID   levothyroxine  100 mcg Oral Q0600   midodrine  10 mg Oral Once per day on Tue Thu Sat   pantoprazole  40 mg Oral Daily   pregabalin  50 mg Oral Daily   sevelamer carbonate  4.8 g  Oral TID WC   sucroferric oxyhydroxide  500 mg Oral TID WC    Assessment/Plan: Debility: Has been unable to tolerate dialysis in recliner and therefore unable to discharge to outpatient dialysis. Per her report has lost >50 lbs during admission. Sacral decub ulcers remain a barrier. She now has new powerchair/cushion and has been sitting for longer periods.  Appreciate efforts from PT team.  ESRD: Usual TTS schedule .  Refusing HD at times but will be back on schedule, next HD 04/10/22. HD access: new TDC placed 02/04/2022, TDC stopped working again 11/30. IR consulted - S/p venogram, angioplasty of SVC and innominate veins, and TDC exchange. No recent issues reported.  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 Started empirically on sodium  thiosulfate, but caused vomiting so stopped - now getting 1/2 dose - 12.5mg  q HD, refuses increased dose. Not on any calcium based products or vitamin D. HypoTN/volume: On midodrine 10mg  qHD, with 10mg  prn mid HD w/parameters to keep SBP >100 d/t becoming symptomatic when it drops lower. Significant weight loss this admit Severe ischial sacral decubitus wound: CT showing chronic osteomyelitis. Limiting her, noted to be slow healing. Goal to tolerate sitting upright - which she is doing with PT.  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 Recent Fe load completed. Hgb drifted down to 6.9,  received 1 unit PRBC, now stable at 8.1. Checking Fe studies  Secondary hyperparathyroidism: On two binders. CorrCa elevated. Not on VDRA, last PTH 86 (low) on 03/11/22. Avoid all calcium based medications for Vitamin D d/t concern for calciphylaxis.  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 Nutrition - diet continues to be changed from renal.  Currently on regular diet.  Would really prefer she be on a renal diet but patient has been refusing Disposition: Difficult situation. Prior to admit, patient required 6 staff members and 2 hoyer pads for transfer into HD chair. Could not stay on HD for full tx. 2/2 pain of multiple decubitus ulcers and PTAR unable to lift pt into ambulance for transport.SW working on LTC at [employer]. Will need to tolerate 5-6hrs in a chair to tolerate OP dialysis including transport, treatment and cannulation time.  The main issue is her Rt ischial wound. PT has arranged seating specialist with plan for custom power chair/cushion that would keep pressure off wounds and allow her to sit for longer periods   [name] PA-C [place] 04/10/2022,1:38 PM   Seen and examined independently.  Agree with note and exam as documented above by physician extender and as noted  here.  I spent 30 minutes with the patient and her daughter discussing her care.    I let her know that we do not have staffing to guarantee a spot later that day if she refuses treatment and that we would not be able to guarantee her a spot at night.  I suggested that she speak with the director of the dialysis unit as she states others have told her that she can always go to dialysis at night.  I let her know this is not the case.   Debility - hasn't been able to tolerate dialysis in a chair due to wounds.  PT is following   ESRD - HD usually per TTS schedule.  She declined treatment earlier today and now we are  not able to run her today.  Discuss with HD unit manager to clarify when HD can be offered.  Try for HD off schedule tomorrow if able   Possible calciphylaxis - on reduced dose of sodium thiosulfate as above. Optimize HD compliance  Appropriate for LTAC if cannot tolerate HD in a chair due to wounds.    Claudia Desanctis, MD 04/10/2022  6:00 PM

## 2022-04-10 NOTE — Progress Notes (Signed)
For the second time ,today ,patient refused to go to her HD treatment .She said " I just sit here I'm my chair ,could I have my dialysis later on the day about at 1500.Explained to patient the need to come at this time.Renal navigator at the bedside,encouraged patient to have her treatment but the patient refused.Early this morning,tried to get her into HD unit for her treatment ,but patient refused.She was telling to her nurse that I am not going to have my HD early,beside ,I need to have my breakfast".     Renal nurse re-schedule her to 2nd shift but she refused again for the second time.

## 2022-04-10 NOTE — Progress Notes (Signed)
Dialysis staff called down for patient to come to dialysis. Patient refused to come to dialysis. This leader call to find out why patient didn't want to come to dialysis. Patient stated " I just got in my chair (wheelchair) and my daughter is coming to visit" so I'm not coming. Provider will be notified.

## 2022-04-11 DIAGNOSIS — I5023 Acute on chronic systolic (congestive) heart failure: Secondary | ICD-10-CM | POA: Diagnosis not present

## 2022-04-11 LAB — CBC
HCT: 27.1 % — ABNORMAL LOW (ref 36.0–46.0)
Hemoglobin: 8.1 g/dL — ABNORMAL LOW (ref 12.0–15.0)
MCH: 28.1 pg (ref 26.0–34.0)
MCHC: 29.9 g/dL — ABNORMAL LOW (ref 30.0–36.0)
MCV: 94.1 fL (ref 80.0–100.0)
Platelets: 184 10*3/uL (ref 150–400)
RBC: 2.88 MIL/uL — ABNORMAL LOW (ref 3.87–5.11)
RDW: 17.2 % — ABNORMAL HIGH (ref 11.5–15.5)
WBC: 8.1 10*3/uL (ref 4.0–10.5)
nRBC: 0 % (ref 0.0–0.2)

## 2022-04-11 LAB — RENAL FUNCTION PANEL
Albumin: 3.2 g/dL — ABNORMAL LOW (ref 3.5–5.0)
Anion gap: 14 (ref 5–15)
BUN: 64 mg/dL — ABNORMAL HIGH (ref 6–20)
CO2: 24 mmol/L (ref 22–32)
Calcium: 9.2 mg/dL (ref 8.9–10.3)
Chloride: 100 mmol/L (ref 98–111)
Creatinine, Ser: 10.9 mg/dL — ABNORMAL HIGH (ref 0.44–1.00)
GFR, Estimated: 4 mL/min — ABNORMAL LOW (ref >60)
Glucose, Bld: 96 mg/dL (ref 70–99)
Phosphorus: 6.9 mg/dL — ABNORMAL HIGH (ref 2.5–4.6)
Potassium: 5.5 mmol/L — ABNORMAL HIGH (ref 3.5–5.1)
Sodium: 138 mmol/L (ref 135–145)

## 2022-04-11 MED ORDER — HEPARIN SODIUM (PORCINE) 1000 UNIT/ML IJ SOLN
2500.0000 [IU] | Freq: Once | INTRAMUSCULAR | Status: AC
  Administered 2022-04-11: 2500 [IU] via INTRAVENOUS

## 2022-04-11 NOTE — Progress Notes (Signed)
Received patient in bed to unit.  Alert and oriented.  Informed consent signed and in chart.   Treatment initiated: 14:44 Treatment completed: 18:00  Patient tolerated well.  Transported back to the room  Alert, without acute distress.  Hand-off given to patient's nurse.   Access used: left dialysis catheter Access issues: none  Total UF removed: 206 Medication(s) given: oxy 5mg    04/11/22 1818  Hemodialysis Catheter Left Internal jugular Double lumen Permanent (Tunneled)  Placement Date/Time: 03/07/22 1105   Serial / Lot #: [id number]  Expiration Date: 09/09/26  Time Out: Correct patient;Correct site;Correct procedure  Maximum sterile barrier precautions: Hand hygiene;Cap;Mask;Sterile gown;Sterile gloves;Large sterile ...  Site Condition No complications  Blue Lumen Status Dead end cap in place;Heparin locked  Red Lumen Status Dead end cap in place;Heparin locked  Purple Lumen Status N/A  Catheter fill solution Heparin 1000 units/ml  Catheter fill volume (Arterial) 2.1 cc  Catheter fill volume (Venous) 2.1  Dressing Type Transparent  Dressing Status Antimicrobial disc in place  Drainage Description None  Post treatment catheter status Capped and Clamped  OTHER  Pain Score 10  Pain  Pain Location Flank  Pain Orientation Right  Pain Assessment  Pain Intervention(s) Medication (See eMAR)      Lilianah Buffin [facility]

## 2022-04-11 NOTE — Progress Notes (Signed)
PROGRESS NOTE    Courtney Gray  WFU:[id number] DOB: 1971-02-22 DOA: 11/01/2021 PCP: Monico Blitz, MD  52 year old F with PMH of ESRD on HD TTS, recent hospitalization for bacteremia, hepatic and splenic abscess on long-term IV antibiotics, diastolic CHF, PVD s/p bilateral AKA, IDDM-2, morbid obesity and chronic sacral wound presenting with progressive shortness of breath in the setting of missed dialysis and admitted for hypertensive emergency.   -Discharge is pending ability to obtain outpatient hemodialysis that can accommodate patient's mobility constraints secondary to decubitus ulcer wound and bilateral AKAs.   Subjective: In good spirits, sitting in her room playing games on the phone  Assessment and Plan:  End stage renal disease [facility]) -Nephrology following, HD tomorrow - EPO, MBD managmenet per Nephrology -Unable to find outpatient dialysis center due to body habitus and wound, inability to sit and tolerate full HD -Now working with PT, increasing tolerance sitting in chair, tolerated 4 hours in power chair yesterday -Continue midodrine with HD  Chronic history of sacral decubitus wound with chronic osteomyelitis -Seen by ID, started on doxycycline 11/28 which caused diarrhea, now off all antibiotics -Patient declined antibiotics, due to her concerns about GI side effects, she would like to continue wound care and watchful waiting at this time, doxycycline discontinued 12/1   Calciphylaxis - Na thiosulfate per Nephrology - Continue oxycodone regimen   Hypertension - Continue midodrine on HD days    Phantom limb pain - Continue new Lyrica, titrate up to 50 daily - Reduce gabapentin   Decubitus ulcer of sacral region, stage 3 ([place]) - WOC   Hypothyroidism - Continue levothyroxine   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    PVD (peripheral vascular disease) ([place]) History of Bilateral AKA   Class 3 obesity (HCC) BMI >40     Chronic respiratory failure Due to morbid obesity  DVT prophylaxis: none Code Status: Full Code Family Communication: None present Disposition Plan: Unknown  Consultants:    Procedures:   Antimicrobials:    Objective: Vitals:   04/10/22 1803 04/10/22 2146 04/11/22 0523 04/11/22 0818  BP: (!) 156/88 (!) 157/54 (!) 153/71 (!) 153/59  Pulse: 76 68 69 74  Resp:  16 18 17   Temp: 98.2 F (36.8 C) 98.5 F (36.9 C) 97.9 F (36.6 C) 98.6 F (37 C)  TempSrc: Oral Oral Oral Oral  SpO2: 100% 100% 100% 100%  Weight:   122.5 kg   Height:        Intake/Output Summary (Last 24 hours) at 04/11/2022 1133 Last data filed at 04/11/2022 2025 Gross per 24 hour  Intake 2470 ml  Output --  Net 2470 ml   Filed Weights   04/08/22 1700 04/08/22 2057 04/11/22 0523  Weight: 119.5 kg 117 kg 122.5 kg    Examination:  General exam: Morbidly obese female sitting up in bed, AAOx3, no distress, playing on his cell phone CVS: S1-S2, regular rhythm Lungs: Clear bilaterally Abdomen: Soft, nontender, abdominal wall wounds noted Extremities: Bilateral AKA  Psychiatry:  Mood & affect appropriate.     Data Reviewed:   CBC: Recent Labs  Lab 04/06/22 1353  WBC 8.6  HGB 8.1*  HCT 27.2*  MCV 95.1  PLT 427   Basic Metabolic Panel: Recent Labs  Lab 04/06/22 1353  NA 139  K 5.1  CL 101  CO2 23  GLUCOSE 134*  BUN 49*  CREATININE 10.08*  CALCIUM 9.4  PHOS 5.7*   GFR: Estimated Creatinine Clearance: 8.7 mL/min (A) (by C-G formula based on SCr of 10.08 mg/dL (H)). Liver Function Tests: Recent Labs  Lab 04/06/22 1353  ALBUMIN 2.9*   No results for input(s): "LIPASE", "AMYLASE" in the last 168 hours. No results for input(s): "AMMONIA" in the last 168 hours. Coagulation Profile: No results for input(s): "INR", "PROTIME" in the last 168 hours. Cardiac Enzymes: No results for  input(s): "CKTOTAL", "CKMB", "CKMBINDEX", "TROPONINI" in the last 168 hours. BNP (last 3 results) No results for input(s): "PROBNP" in the last 8760 hours. HbA1C: No results for input(s): "HGBA1C" in the last 72 hours. CBG: No results for input(s): "GLUCAP" in the last 168 hours. Lipid Profile: No results for input(s): "CHOL", "HDL", "LDLCALC", "TRIG", "CHOLHDL", "LDLDIRECT" in the last 72 hours. Thyroid Function Tests: No results for input(s): "TSH", "T4TOTAL", "FREET4", "T3FREE", "THYROIDAB" in the last 72 hours. Anemia Panel: No results for input(s): "VITAMINB12", "FOLATE", "FERRITIN", "TIBC", "IRON", "RETICCTPCT" in the last 72 hours. Urine analysis: No results found for: "COLORURINE", "APPEARANCEUR", "LABSPEC", "PHURINE", "GLUCOSEU", "HGBUR", "BILIRUBINUR", "KETONESUR", "PROTEINUR", "UROBILINOGEN", "NITRITE", "LEUKOCYTESUR" Sepsis Labs: @LABRCNTIP (procalcitonin:4,lacticidven:4)  )No results found for this or any previous visit (from the past 240 hour(s)).   Radiology Studies: No results found.   Scheduled Meds:  aspirin EC  81 mg Oral Daily   atorvastatin  10 mg Oral Daily   Chlorhexidine Gluconate Cloth  6 each Topical Q0600   darbepoetin (ARANESP) injection - DIALYSIS  200 mcg Subcutaneous Q Tue-1800   docusate sodium  100 mg Oral BID   famotidine  20 mg Oral Daily   feeding supplement  1 Container Oral TID BM   gabapentin  100 mg Oral QHS   Gerhardt's butt cream   Topical BID   levothyroxine  100 mcg Oral Q0600   midodrine  10 mg Oral Once per day on Tue Thu Sat   pantoprazole  40 mg Oral Daily   pregabalin  50 mg Oral Daily   sevelamer carbonate  4.8 g Oral TID WC   sucroferric oxyhydroxide  500 mg Oral TID WC   Continuous Infusions:  sodium thiosulfate 12.5 g in sodium chloride 0.9 % 150 mL Infusion for Calciphylaxis Stopped (04/06/22 1700)     LOS: 161 days    Time spent: 44min    Domenic Polite, MD [facility]   04/11/2022, 11:33 AM

## 2022-04-11 NOTE — Progress Notes (Signed)
Advised by staff that pt has been tolerating sitting in her w/c really well this week. Will likely have to complete a new referral to local HD clinics once d/c disposition is known. Will also need to know how pt will tx from w/c to HD to make this can be done safely in the out-pt setting. Pt will need to receive HD in chair prior to d/c as well. Will continue to follow and assist as needed.   Melven Sartorius Renal Navigator [phone]

## 2022-04-11 NOTE — Progress Notes (Addendum)
[facility] Progress Note   Subjective:    She did not come to dialysis yesterday. Completing dialysis today. Up and about in her power chair this am. Labs drawn with dialysis today   Objective Vitals:   04/10/22 2146 04/11/22 0523 04/11/22 0818 04/11/22 1446  BP: (!) 157/54 (!) 153/71 (!) 153/59 126/81  Pulse: 68 69 74 71  Resp: 16 18 17 16   Temp: 98.5 F (36.9 C) 97.9 F (36.6 C) 98.6 F (37 C) (!) 97.4 F (36.3 C)  TempSrc: Oral Oral Oral   SpO2: 100% 100% 100% 99%  Weight:  122.5 kg  121.9 kg  Height:       Physical Exam General: Well appearing, nad  Heart: RRR, 2/6 systolic murmur Lungs: CTA bilaterally Abdomen: Obese, non-distended, +BS Extremities: No stump/hip edema Dialysis Access: R IJ [facility]  Filed Weights   04/08/22 2057 04/11/22 0523 04/11/22 1446  Weight: 117 kg 122.5 kg 121.9 kg    Intake/Output Summary (Last 24 hours) at 04/11/2022 1459 Last data filed at 04/11/2022 0842 Gross per 24 hour  Intake 2230 ml  Output --  Net 2230 ml     Additional Objective Labs: Basic Metabolic Panel: Recent Labs  Lab 04/06/22 1353  NA 139  K 5.1  CL 101  CO2 23  GLUCOSE 134*  BUN 49*  CREATININE 10.08*  CALCIUM 9.4  PHOS 5.7*    Liver Function Tests: Recent Labs  Lab 04/06/22 1353  ALBUMIN 2.9*    No results for input(s): "LIPASE", "AMYLASE" in the last 168 hours. CBC: Recent Labs  Lab 04/06/22 1353  WBC 8.6  HGB 8.1*  HCT 27.2*  MCV 95.1  PLT 213    Blood Culture    Component Value Date/Time   SDES BLOOD LEFT ANTECUBITAL 10/16/2021 1534   SPECREQUEST  10/16/2021 1534    BOTTLES DRAWN AEROBIC AND ANAEROBIC Blood Culture results may not be optimal due to an inadequate volume of blood received in culture bottles   CULT  10/16/2021 1534    NO GROWTH 5 DAYS Performed at [facility], [place] [address] [place], [place] [address]    REPTSTATUS 10/21/2021 FINAL 10/16/2021 1534    Cardiac Enzymes: No results for input(s):  "CKTOTAL", "CKMB", "CKMBINDEX", "TROPONINI" in the last 168 hours. CBG: No results for input(s): "GLUCAP" in the last 168 hours. Iron Studies: No results for input(s): "IRON", "TIBC", "TRANSFERRIN", "FERRITIN" in the last 72 hours. Lab Results  Component Value Date   INR 1.3 (H) 10/09/2021   INR 1.3 (H) 09/10/2021   INR 1.3 (H) 07/10/2021   Studies/Results: No results found.  Medications:  sodium thiosulfate 12.5 g in sodium chloride 0.9 % 150 mL Infusion for Calciphylaxis Stopped (04/06/22 1700)    aspirin EC  81 mg Oral Daily   atorvastatin  10 mg Oral Daily   Chlorhexidine Gluconate Cloth  6 each Topical Q0600   darbepoetin (ARANESP) injection - DIALYSIS  200 mcg Subcutaneous Q Tue-1800   docusate sodium  100 mg Oral BID   famotidine  20 mg Oral Daily   feeding supplement  1 Container Oral TID BM   gabapentin  100 mg Oral QHS   Gerhardt's butt cream   Topical BID   levothyroxine  100 mcg Oral Q0600   midodrine  10 mg Oral Once per day on Tue Thu Sat   pantoprazole  40 mg Oral Daily   pregabalin  50 mg Oral Daily   sevelamer carbonate  4.8 g  Oral TID WC   sucroferric oxyhydroxide  500 mg Oral TID WC    Assessment/Plan: Debility: Has been unable to tolerate dialysis in recliner and therefore unable to discharge to outpatient dialysis. Per her report has lost >50 lbs during admission. Sacral decub ulcers remain a barrier. She now has new powerchair/cushion and has been sitting for longer periods.  Appreciate efforts from PT team.  ESRD: Usual TTS schedule .  Refusing HD at times. HD today 04/11/22. HD tomorrow to get back on schedule.  Has been advised that she cannot refuse dialysis during the day and expect to run at night.  HD access: new TDC placed 02/04/2022, TDC stopped working again 11/30. IR consulted - S/p venogram, angioplasty of SVC and innominate veins, and TDC exchange. No recent issues reported.  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 Started empirically on sodium thiosulfate, but caused vomiting so stopped - now getting 1/2 dose - 12.5mg  q HD, refuses increased dose. Not on any calcium based products or vitamin D. HypoTN/volume: On midodrine 10mg  qHD, with 10mg  prn mid HD w/parameters to keep SBP >100 d/t becoming symptomatic when it drops lower. Significant weight loss this admit Severe ischial sacral decubitus wound: CT showing chronic osteomyelitis. Limiting her, noted to be slow healing. Goal to tolerate sitting upright - which she is doing with PT.  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 Recent Fe load completed. Hgb drifted down to 6.9,  received 1 unit PRBC, now stable at 8.1. Checking Fe studies  Secondary hyperparathyroidism: On two binders. CorrCa elevated. Not on VDRA, last PTH 86 (low) on 03/11/22. Avoid all calcium based medications for Vitamin D d/t concern for calciphylaxis.  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 Nutrition - diet continues to be changed from renal.  Currently on regular diet.  Would really prefer she be on a renal diet but patient has been refusing Disposition: Difficult situation. Prior to admit, patient required 6 staff members and 2 hoyer pads for transfer into HD chair. Could not stay on HD for full tx. 2/2 pain of multiple decubitus ulcers and PTAR unable to lift pt into ambulance for transport.SW working on LTC at [employer]. Will need to tolerate 5-6hrs in a chair to tolerate OP dialysis including transport, treatment and cannulation time.  The main issue is her Rt ischial wound. PT has arranged seating specialist with plan for custom power chair/cushion that would keep pressure off wounds and allow her to sit for longer periods   [place] PA-C [place] 04/11/2022,2:59 PM   Seen by PA.  Agree  with note and plans as documented above by physician extender and as noted here.  She was sitting with the nurses today at the desk and has been doing better with sitting in her power chair.  Chemistry lab down.  Ran today after she missed yesterday  Claudia Desanctis, MD 04/11/2022  6:25 PM

## 2022-04-11 NOTE — Progress Notes (Signed)
Requested sodium thiosulfate from pharmacy at 1624, pt has 30 mins left of hemodialysis, medication is still not here, medication not given during hemodialysis

## 2022-04-11 NOTE — Progress Notes (Signed)
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    PT Comments    Patient progressing well towards PT goals. Session focused on continued functional transfers and power chair mobility. Pt asking why she cannot transfer on her own at this time. Able to verbalize and demonstrate safe w/c set up (with assist), transfer technique and features of w/c when out in hallways with no cues. Continues to require Min A of 2 to assist with AP transfer due to maintaining bed pad to protect skin and for assist. Pt reports some difficulty with anterior transfer back to bed. Recommend working on anterior and lateral scoot transfers to prepare pt for different environments and to improve overall core strength as well as making pt more versatile in the community. Also discussed possibly integrating mobility into the community using her power chair if approved by her MD to work on navigating side walks, crossing the street, real life scenarios etc. Encouraged getting OOB to chair daily with nursing. Will continue to follow.   Recommendations for follow up therapy are one component of a multi-disciplinary discharge planning process, led by the attending physician.  Recommendations may be updated based on patient status, additional functional criteria and insurance authorization.  Follow Up Recommendations  Home health PT (consider PACE services vs SNF pending housing) Can patient physically be transported by private vehicle: No   Assistance Recommended at Discharge Intermittent  Supervision/Assistance  Patient can return home with the following A little help with walking and/or transfers;Help with stairs or ramp for entrance;Assist for transportation (medical [name]i transport)   Financial risk analyst (measurements PT);Wheelchair cushion (measurements PT);Other (comment);Hospital bed (more accomodating mechanical lift, with sling that handles pt's with bil AKAs; Roho cushion (20x24), power wheelchair with power tilt and recline)    Recommendations for Other Services       Precautions / Restrictions Precautions Precautions: Fall Precaution Comments: bil AKA, 2L O2 at baseline (has been tolerating mobility in power chair on [employer] fine); when in power chair, MUST perform pressure relief every 30 minutes by tilting back for 10 minutes Restrictions Weight Bearing Restrictions: No RUE Weight Bearing: Weight bearing as tolerated LUE Weight Bearing: Weight bearing as tolerated RLE Weight Bearing: Non weight bearing LLE Weight Bearing: Non weight bearing Other Position/Activity Restrictions: when in power chair, MUST perform pressure relief every 30 minutes by tilting back for 10 minutes     Mobility  Bed Mobility Overal bed mobility: Modified Independent             General bed mobility comments: extra time, no assist; uses bedrails.    Transfers Overall transfer level: Needs assistance Equipment used:  (bed pad) Transfers: Bed to chair/wheelchair/BSC         Anterior-Posterior transfers: Min assist, +2 physical assistance, +2 safety/equipment   General transfer comment: Able to instruct therapists on how to setup up power chair prior to transfers, use of bed pad to scoot posteriorly back into chair with Min A of 2.    Ambulation/Gait  Stairs             Information systems manager mobility: Yes Wheelchair propulsion:  (power) Wheelchair parts: Independent Distance: around  the hospital Wheelchair Assistance Details (indicate cue type and reason): Able to verbally state/demonstrate modes on chair, setup, safety measure/speeds, turning off w/c when stopping, alignment and pressure relief schedule. Did well navigating obstacles, traffic in hallway and demonstrated good carry over for scanning environment, and stopping in doorway to make sure traffic is clear and there is clearance to proceed safely.  Modified Rankin (Stroke Patients Only)       Balance Overall balance assessment: Mild deficits observed, not formally tested     Sitting balance - Comments: Able to sit unsupported in bed without difficulties                                    Cognition Arousal/Alertness: Awake/alert Behavior During Therapy: WFL for tasks assessed/performed Overall Cognitive Status: Within Functional Limits for tasks assessed                                 General Comments: Able to verbalize the need to tilt for pressure relief and demonstrate how to safely use power chair controls etc/set up for transfer        Exercises      General Comments        Pertinent Vitals/Pain Pain Assessment Pain Assessment: No/denies pain    Home Living                          Prior Function            PT Goals (current goals can now be found in the care plan section) Progress towards PT goals: Progressing toward goals    Frequency           PT Plan Current plan remains appropriate    Co-evaluation              AM-PAC PT "6 Clicks" Mobility   Outcome Measure  Help needed turning from your back to your side while in a flat bed without using bedrails?: None Help needed moving from lying on your back to sitting on the side of a flat bed without using bedrails?: None Help needed moving to and from a bed to a chair (including a wheelchair)?: A Little Help needed standing up from a chair using your arms (e.g., wheelchair or  bedside chair)?: Total Help needed to walk in hospital room?: Total Help needed climbing 3-5 steps with a railing? : Total 6 Click Score: 14    End of Session Equipment Utilized During Treatment: Other (comment) (bed pad) Activity Tolerance: Patient tolerated treatment well Patient left: Other (comment) (in power chair) Nurse Communication: Mobility status PT Visit Diagnosis: Other abnormalities of gait and mobility (R26.89);Muscle weakness (generalized) (M62.81)     Time: 3614-4315 PT Time Calculation (min) (ACUTE ONLY): 45 min  Charges:  $Therapeutic Activity: 8-22 mins $Self Care/Home Management: 8-22 $Wheel Chair Management: 8-22 mins                     Zettie Cooley, DPT Acute Rehabilitation Services Secure chat preferred Office [place] 04/11/2022, 12:03 PM

## 2022-04-11 NOTE — Progress Notes (Signed)
Pt in bed no c/os no distress noted stable for HD.

## 2022-04-11 NOTE — Plan of Care (Signed)

## 2022-04-11 NOTE — TOC Progression Note (Addendum)
Transition of Care [facility]) - Progression Note    Patient Details  Name: Courtney Gray MRN: [id number] Date of Birth: [birth year]  [facility] CM/SW Contact  Tom-Johnson, Renea Ee, RN Phone Number: 04/11/2022, 10:09 AM  Clinical Narrative:     Home health PT recommended by PT. CM spoke with patient and patient states she does not have a home to return to . Was living in [place] with daughter prior to admission and daughter gave up apartment to live closer to patient while in the hospital.  Patient's daughter currently stays at a hotel and will not be safe for patient to discharge to at this time.  Patient states her daughter is looking at getting an apartment and requested affordable housing. LCSW notified.  CM will continue to follow.       Expected Discharge Plan: Long Term Nursing Home Barriers to Discharge: No SNF bed, Transportation, Waiting for outpatient dialysis  Expected Discharge Plan and Services       Living arrangements for the past 2 months: Single Family Home                                       Social Determinants of Health (SDOH) Interventions SDOH Screenings   Food Insecurity: No Food Insecurity (12/11/2021)  Housing: Low Risk  (12/11/2021)  Transportation Needs: No Transportation Needs (12/11/2021)  Utilities: At Risk (12/11/2021)  Tobacco Use: Low Risk  (03/07/2022)    Readmission Risk Interventions    09/16/2021    4:36 PM 08/28/2021   11:50 AM 07/12/2021   12:38 PM  Readmission Risk Prevention Plan  Transportation Screening Complete Complete Complete  Medication Review Press photographer)  Complete Complete  PCP or Specialist appointment within 3-5 days of discharge  Complete Complete  HRI or Home Care Consult Complete Complete Complete  SW Recovery Care/Counseling Consult Complete Complete Complete  Palliative Care Screening  Not Applicable Not Applicable  Skilled Nursing Facility Complete Not Applicable Complete

## 2022-04-12 MED ORDER — ANTICOAGULANT SODIUM CITRATE 4% (200MG/5ML) IV SOLN: 5.0000 mL | Status: DC | PRN

## 2022-04-12 MED ORDER — HEPARIN SODIUM (PORCINE) 1000 UNIT/ML DIALYSIS: 20.0000 [IU]/kg | INTRAMUSCULAR | Status: DC | PRN

## 2022-04-12 NOTE — Progress Notes (Signed)
Called pt's primary nurse to get him to talk to pt to let her know we were offering her a 2nd round spot for dialysis today and needed to know if she was going to take it or not before we set up the machine. Primary nurse went to room to talk to pt and she immediately told him no, she wasn't going to dialysis today b/c she just had it yesterday. He explained to her we are trying to get her back on her regular schedule. She again refused dialysis tx today

## 2022-04-12 NOTE — Progress Notes (Signed)
[facility] Progress Note   Subjective:    Completed dialysis yesterday. Net UF 2.6L. Does not want to come to dialysis today.  Reports she sat in her power chair for 4 hours yesterday    Objective Vitals:   04/11/22 1823 04/11/22 2048 04/12/22 0556 04/12/22 0844  BP: 114/60 (!) 144/67 (!) 149/73 101/62  Pulse: 69 66 64 64  Resp: 13 18 18 17   Temp:  98.6 F (37 C) 98.7 F (37.1 C) 98.6 F (37 C)  TempSrc:  Oral Oral Oral  SpO2: 100% 100% 100% 100%  Weight:      Height:       Physical Exam General: Well appearing, nad  Heart: RRR,  Lungs: Normal WOB Abdomen: Obese, soft  Extremities: No stump/hip edema Dialysis Access: R IJ [facility]  Filed Weights   04/11/22 0523 04/11/22 1446 04/11/22 1800  Weight: 122.5 kg 121.9 kg 119.3 kg    Intake/Output Summary (Last 24 hours) at 04/12/2022 0943 Last data filed at 04/12/2022 0856 Gross per 24 hour  Intake 220 ml  Output 2600 ml  Net -2380 ml     Additional Objective Labs: Basic Metabolic Panel: Recent Labs  Lab 04/06/22 1353 04/11/22 0500  NA 139 138  K 5.1 5.5*  CL 101 100  CO2 23 24  GLUCOSE 134* 96  BUN 49* 64*  CREATININE 10.08* 10.90*  CALCIUM 9.4 9.2  PHOS 5.7* 6.9*    Liver Function Tests: Recent Labs  Lab 04/06/22 1353 04/11/22 0500  ALBUMIN 2.9* 3.2*    No results for input(s): "LIPASE", "AMYLASE" in the last 168 hours. CBC: Recent Labs  Lab 04/06/22 1353 04/11/22 1457  WBC 8.6 8.1  HGB 8.1* 8.1*  HCT 27.2* 27.1*  MCV 95.1 94.1  PLT 213 184    Blood Culture    Component Value Date/Time   SDES BLOOD LEFT ANTECUBITAL 10/16/2021 1534   SPECREQUEST  10/16/2021 1534    BOTTLES DRAWN AEROBIC AND ANAEROBIC Blood Culture results may not be optimal due to an inadequate volume of blood received in culture bottles   CULT  10/16/2021 1534    NO GROWTH 5 DAYS Performed at [facility], [place] [address], [place], [place] [address]    REPTSTATUS 10/21/2021 FINAL 10/16/2021 1534     Cardiac Enzymes: No results for input(s): "CKTOTAL", "CKMB", "CKMBINDEX", "TROPONINI" in the last 168 hours. CBG: No results for input(s): "GLUCAP" in the last 168 hours. Iron Studies: No results for input(s): "IRON", "TIBC", "TRANSFERRIN", "FERRITIN" in the last 72 hours. Lab Results  Component Value Date   INR 1.3 (H) 10/09/2021   INR 1.3 (H) 09/10/2021   INR 1.3 (H) 07/10/2021   Studies/Results: No results found.  Medications:  anticoagulant sodium citrate     sodium thiosulfate 12.5 g in sodium chloride 0.9 % 150 mL Infusion for Calciphylaxis Stopped (04/06/22 1700)    aspirin EC  81 mg Oral Daily   atorvastatin  10 mg Oral Daily   Chlorhexidine Gluconate Cloth  6 each Topical Q0600   darbepoetin (ARANESP) injection - DIALYSIS  200 mcg Subcutaneous Q Tue-1800   docusate sodium  100 mg Oral BID   famotidine  20 mg Oral Daily   feeding supplement  1 Container Oral TID BM   gabapentin  100 mg Oral QHS   Gerhardt's butt cream   Topical BID   levothyroxine  100 mcg Oral Q0600   midodrine  10 mg Oral Once per day on Tue Thu Sat  pantoprazole  40 mg Oral Daily   pregabalin  50 mg Oral Daily   sevelamer carbonate  4.8 g Oral TID WC   sucroferric oxyhydroxide  500 mg Oral TID WC    Assessment/Plan: Debility: Has been unable to tolerate dialysis in recliner and therefore unable to discharge to outpatient dialysis. Per her report has lost >50 lbs during admission. Sacral decub ulcers remain a barrier. She now has new powerchair/cushion and has been sitting for longer periods.  Appreciate efforts from PT team.  ESRD: Usual TTS schedule .  Refusing HD at times. Last  04/11/22. Refuses back to back treatment on 1/6 so next dialysis will be 1/8.  HD access: new TDC placed 02/04/2022, TDC stopped working again 11/30. IR consulted - S/p venogram, angioplasty of SVC and innominate veins, and TDC exchange. No recent issues reported.  Painful nodular lesions in abdomen/possible  calciphylaxis(no BX): Initially felt just d/t subq heparin injections, but then worsening. Started empirically on sodium thiosulfate, but caused vomiting so stopped - now getting 1/2 dose - 12.5mg  q HD, refuses increased dose. Not on any calcium based products or vitamin D. HypoTN/volume: On midodrine 10mg  qHD, with 10mg  prn mid HD w/parameters to keep SBP >100 d/t becoming symptomatic when it drops lower. Significant weight loss this admit Severe ischial sacral decubitus wound: CT showing chronic osteomyelitis. Limiting her, noted to be slow healing. Goal to tolerate sitting upright - which she is doing with PT.  COPD/Chronic O2 use: Prior O2 at home (5L/min) -> tapered to 2-3L/min here Anemia of ESRD: on max dose Aranesp 249mcg weekly. Recent Fe load completed. Hgb drifted down to 6.9,  received 1 unit PRBC, now stable at 8.1. Checking Fe studies  Secondary hyperparathyroidism: On two binders. CorrCa elevated. Not on VDRA, last PTH 86 (low) on 03/11/22. Avoid all calcium based medications for Vitamin D d/t concern for calciphylaxis.  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 Nutrition - diet continues to be changed from renal.  Currently on regular diet.  Would really prefer she be on a renal diet but patient has been refusing Disposition: Difficult situation. Prior to admit, patient required 6 staff members and 2 hoyer pads for transfer into HD chair. Could not stay on HD for full tx. 2/2 pain of multiple decubitus ulcers and PTAR unable to lift pt into ambulance for transport.SW working on LTC at [employer]. Will need to tolerate 5-6hrs in a chair to tolerate OP dialysis including transport, treatment and cannulation time.  The main issue is her Rt ischial wound. She now has new power chair and cushion. She has been sitting in the chair for longer periods. Up to 4 hours this week.    [name] PA-C [facility] 04/12/2022,9:43 AM

## 2022-04-12 NOTE — Progress Notes (Signed)
Patient seen and examined, sitting up in bed, pleasant and smiling, no changes from my note yesterday -Sat in the power chair for 4 hours yesterday, set up in bed for dialysis yesterday but last 30 minutes had worsening pain -Continue to increase tolerance for seated position every day  Domenic Polite, MD

## 2022-04-14 DIAGNOSIS — I5023 Acute on chronic systolic (congestive) heart failure: Secondary | ICD-10-CM | POA: Diagnosis not present

## 2022-04-14 MED ORDER — HEPARIN SODIUM (PORCINE) 1000 UNIT/ML DIALYSIS
1000.0000 [IU] | INTRAMUSCULAR | Status: DC | PRN
  Administered 2022-04-15: 4200 [IU]
  Filled 2022-04-14 (×3): qty 1

## 2022-04-14 MED ORDER — HEPARIN SODIUM (PORCINE) 1000 UNIT/ML DIALYSIS
20.0000 [IU]/kg | INTRAMUSCULAR | Status: DC | PRN
  Administered 2022-04-15: 2400 [IU] via INTRAVENOUS_CENTRAL
  Filled 2022-04-14 (×3): qty 3

## 2022-04-14 MED ORDER — ALTEPLASE 2 MG IJ SOLR: 2.0000 mg | Freq: Once | INTRAMUSCULAR | Status: DC | PRN

## 2022-04-14 NOTE — Progress Notes (Signed)
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  Noted refused 1/06 Saturday dialysis last week so now on MWF schedule  Objective Vital signs in last 24 hours: Vitals:   04/13/22 0828 04/13/22 0830 04/13/22 2017 04/14/22 0850  BP: (!) 173/80 (!) 149/68 (!) 148/66 (!) 175/76  Pulse: 66 68 65 70  Resp: 19  18 16   Temp: 98.4 F (36.9 C)  98.3 F (36.8 C) 98 F (36.7 C)  TempSrc: Oral  Oral Oral  SpO2: 97%  100% 99%  Weight:      Height:       Weight change:   Physical Exam: General: Alert obese adult female NAD Heart: RRR no MRG appreciated Lungs: CTA bilaterally nonlabored breathing on nasal cannula oxygen Abdomen: Obese NABS, nondistended minimally tender at calciphylaxis sites Extremities: Possible hip edema otherwise stumps clear Dialysis Access: Right IJ TDC  Problem/Plan: Debility: Has been unable to tolerate dialysis in recliner and therefore unable to discharge to outpatient dialysis. Per her report has lost >50 lbs during admission. Sacral decub ulcers remain a barrier. She now has new powerchair/cushion and has been sitting for longer periods.  Appreciate efforts from PT team.  ESRD: Was usual TTS schedule .  Refusing HD at times. Last  04/11/22. Refuses back to back treatment on 1/6 so next dialysis will be today 1/8.  now will be MWF HD access: new TDC placed 02/04/2022, TDC stopped working again 11/30. IR consulted - S/p venogram, angioplasty of SVC and innominate veins, and TDC exchange. No recent issues reported.  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 Started empirically on sodium thiosulfate, but caused vomiting so stopped - now getting 1/2 dose - 12.5mg  q HD, refuses increased dose. Not on any calcium based products or vitamin D. HypoTN/volume: On midodrine 10mg  qHD, with 10mg  prn mid HD w/parameters to  keep SBP >100 d/t becoming symptomatic when it drops lower. Significant weight loss this admit Severe ischial sacral decubitus wound: CT showing chronic osteomyelitis. Limiting her, noted to be slow healing. Goal to tolerate sitting upright - which she is doing with PT.  COPD/Chronic O2 use: Prior O2 at home (5L/min) -> tapered to 2-3L/min here Anemia of ESRD: Last on January 5 Hgb 8.1, on max dose Aranesp 260mcg weekly. Recent Fe load completed. Hgb drifted down to 6.9,  received 1 unit PRBC, Checking Fe studies  Secondary hyperparathyroidism: On two binders. CorrCa elevated. Not on VDRA, last PTH 86 (low) on 03/11/22. Avoid all calcium based medications for Vitamin D d/t concern for calciphylaxis.  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 Nutrition -ALB 3.2 diet continues to be changed from renal.  Currently on regular diet.  Would really prefer she be on a renal diet but patient has been refusing Disposition: Difficult situation. Prior to admit, patient required 6 staff members and 2 hoyer pads for transfer into HD chair. Could not stay on HD for full tx. 2/2 pain of multiple decubitus ulcers and PTAR unable to lift pt into ambulance for transport.SW working on LTC at [employer]. Will need to tolerate 5-6hrs in a chair to tolerate OP dialysis including transport, treatment and cannulation time.  The main issue is her Rt ischial wound. She now has new power chair and cushion. She has been sitting in the chair for longer periods. Up to 4 hours  past  week  Ernest Haber, PA-C [facility] [employer] [phone] 04/14/2022,3:45 PM  LOS: 164 days   Labs: Basic Metabolic Panel: Recent Labs  Lab 04/11/22 0500  NA 138  K 5.5*  CL 100  CO2 24  GLUCOSE 96  BUN 64*  CREATININE 10.90*  CALCIUM 9.2  PHOS 6.9*   Liver Function Tests: Recent Labs  Lab 04/11/22 0500  ALBUMIN 3.2*   No results for input(s): "LIPASE", "AMYLASE" in the last 168  hours. No results for input(s): "AMMONIA" in the last 168 hours. CBC: Recent Labs  Lab 04/11/22 1457  WBC 8.1  HGB 8.1*  HCT 27.1*  MCV 94.1  PLT 184   Cardiac Enzymes: No results for input(s): "CKTOTAL", "CKMB", "CKMBINDEX", "TROPONINI" in the last 168 hours. CBG: No results for input(s): "GLUCAP" in the last 168 hours.  Studies/Results: No results found. Medications:  anticoagulant sodium citrate     sodium thiosulfate 12.5 g in sodium chloride 0.9 % 150 mL Infusion for Calciphylaxis Stopped (04/06/22 1700)    aspirin EC  81 mg Oral Daily   atorvastatin  10 mg Oral Daily   Chlorhexidine Gluconate Cloth  6 each Topical Q0600   darbepoetin (ARANESP) injection - DIALYSIS  200 mcg Subcutaneous Q Tue-1800   docusate sodium  100 mg Oral BID   famotidine  20 mg Oral Daily   feeding supplement  1 Container Oral TID BM   gabapentin  100 mg Oral QHS   Gerhardt's butt cream   Topical BID   levothyroxine  100 mcg Oral Q0600   midodrine  10 mg Oral Once per day on Tue Thu Sat   pantoprazole  40 mg Oral Daily   pregabalin  50 mg Oral Daily   sevelamer carbonate  4.8 g Oral TID WC   sucroferric oxyhydroxide  500 mg Oral TID WC

## 2022-04-14 NOTE — Progress Notes (Signed)
Dialysis staff called for patient to come to dialysis. Patient refused to come to dialysis. Provider notified.

## 2022-04-14 NOTE — Progress Notes (Signed)
PROGRESS NOTE    Courtney Gray  POE:[id number] DOB: 01/15/1971 DOA: 11/01/2021 PCP: Monico Blitz, MD  52 year old F with PMH of ESRD on HD TTS, recent hospitalization for bacteremia, hepatic and splenic abscess on long-term IV antibiotics, diastolic CHF, PVD s/p bilateral AKA, IDDM-2, morbid obesity and chronic sacral wound presenting with progressive shortness of breath in the setting of missed dialysis and admitted for hypertensive emergency.   -Discharge is pending ability to obtain outpatient hemodialysis that can accommodate patient's mobility constraints secondary to decubitus ulcer wound and bilateral AKAs.   Subjective: c/o pain at site of Abd wounds/calciphylaxis, getting  Assessment and Plan:  End stage renal disease ([place]) -Nephrology following, HD tomorrow - EPO, MBD managmenet per Nephrology -Unable to find outpatient dialysis center due to body habitus and wound, inability to sit and tolerate full HD -Now working with PT, increasing tolerance sitting in chair, tolerated 4 hours in power chair yesterday -Continue midodrine with HD  Chronic history of sacral decubitus wound with chronic osteomyelitis -Seen by ID, started on doxycycline 11/28 which caused diarrhea, now off all antibiotics -Patient declined antibiotics, due to her concerns about GI side effects, she would like to continue wound care and watchful waiting at this time, doxycycline discontinued 12/1   Calciphylaxis - Na thiosulfate per Nephrology - Continue oxycodone regimen   Hypertension - Continue midodrine on HD days    Phantom limb pain - Continue new Lyrica, titrate up to 50 daily - Reduce gabapentin   Decubitus ulcer of sacral region, stage 3 ([place]) - WOC   Hypothyroidism - Continue levothyroxine   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   PVD  (peripheral vascular disease) ([place]) History of Bilateral AKA   Class 3 obesity (HCC) BMI >40    Chronic respiratory failure Due to morbid obesity  DVT prophylaxis: none Code Status: Full Code Family Communication: None present Disposition Plan: Unknown  Consultants:    Procedures:   Antimicrobials:    Objective: Vitals:   04/13/22 0828 04/13/22 0830 04/13/22 2017 04/14/22 0850  BP: (!) 173/80 (!) 149/68 (!) 148/66 (!) 175/76  Pulse: 66 68 65 70  Resp: 19  18 16   Temp: 98.4 F (36.9 C)  98.3 F (36.8 C) 98 F (36.7 C)  TempSrc: Oral  Oral Oral  SpO2: 97%  100% 99%  Weight:      Height:        Intake/Output Summary (Last 24 hours) at 04/14/2022 1422 Last data filed at 04/14/2022 3154 Gross per 24 hour  Intake 220 ml  Output 0 ml  Net 220 ml   Filed Weights   04/11/22 0523 04/11/22 1446 04/11/22 1800  Weight: 122.5 kg 121.9 kg 119.3 kg    Examination:  General exam: Morbidly obese female sitting up in bed, AAOx3, no distress, playing on his cell phone CVS: S1-S2, regular rhythm Lungs: Clear bilaterally Abdomen: Soft, nontender, abdominal wall wounds noted with dressing Extremities: Bilateral AKA  Psychiatry:  Mood & affect appropriate.     Data Reviewed:   CBC: Recent Labs  Lab 04/11/22 1457  WBC 8.1  HGB 8.1*  HCT 27.1*  MCV 94.1  PLT 008   Basic Metabolic Panel: Recent Labs  Lab 04/11/22 0500  NA 138  K 5.5*  CL 100  CO2 24  GLUCOSE 96  BUN 64*  CREATININE 10.90*  CALCIUM 9.2  PHOS 6.9*   GFR:  Estimated Creatinine Clearance: 7.9 mL/min (A) (by C-G formula based on SCr of 10.9 mg/dL (H)). Liver Function Tests: Recent Labs  Lab 04/11/22 0500  ALBUMIN 3.2*   No results for input(s): "LIPASE", "AMYLASE" in the last 168 hours. No results for input(s): "AMMONIA" in the last 168 hours. Coagulation Profile: No results for input(s): "INR", "PROTIME" in the last 168 hours. Cardiac Enzymes: No results for input(s): "CKTOTAL", "CKMB",  "CKMBINDEX", "TROPONINI" in the last 168 hours. BNP (last 3 results) No results for input(s): "PROBNP" in the last 8760 hours. HbA1C: No results for input(s): "HGBA1C" in the last 72 hours. CBG: No results for input(s): "GLUCAP" in the last 168 hours. Lipid Profile: No results for input(s): "CHOL", "HDL", "LDLCALC", "TRIG", "CHOLHDL", "LDLDIRECT" in the last 72 hours. Thyroid Function Tests: No results for input(s): "TSH", "T4TOTAL", "FREET4", "T3FREE", "THYROIDAB" in the last 72 hours. Anemia Panel: No results for input(s): "VITAMINB12", "FOLATE", "FERRITIN", "TIBC", "IRON", "RETICCTPCT" in the last 72 hours. Urine analysis: No results found for: "COLORURINE", "APPEARANCEUR", "LABSPEC", "PHURINE", "GLUCOSEU", "HGBUR", "BILIRUBINUR", "KETONESUR", "PROTEINUR", "UROBILINOGEN", "NITRITE", "LEUKOCYTESUR" Sepsis Labs: @LABRCNTIP (procalcitonin:4,lacticidven:4)  )No results found for this or any previous visit (from the past 240 hour(s)).   Radiology Studies: No results found.   Scheduled Meds:  aspirin EC  81 mg Oral Daily   atorvastatin  10 mg Oral Daily   Chlorhexidine Gluconate Cloth  6 each Topical Q0600   darbepoetin (ARANESP) injection - DIALYSIS  200 mcg Subcutaneous Q Tue-1800   docusate sodium  100 mg Oral BID   famotidine  20 mg Oral Daily   feeding supplement  1 Container Oral TID BM   gabapentin  100 mg Oral QHS   Gerhardt's butt cream   Topical BID   levothyroxine  100 mcg Oral Q0600   midodrine  10 mg Oral Once per day on Tue Thu Sat   pantoprazole  40 mg Oral Daily   pregabalin  50 mg Oral Daily   sevelamer carbonate  4.8 g Oral TID WC   sucroferric oxyhydroxide  500 mg Oral TID WC   Continuous Infusions:  anticoagulant sodium citrate     sodium thiosulfate 12.5 g in sodium chloride 0.9 % 150 mL Infusion for Calciphylaxis Stopped (04/06/22 1700)     LOS: 164 days    Time spent: 90min    Domenic Polite, MD [facility]   04/14/2022, 2:22 PM

## 2022-04-15 DIAGNOSIS — I5023 Acute on chronic systolic (congestive) heart failure: Secondary | ICD-10-CM | POA: Diagnosis not present

## 2022-04-15 LAB — CBC
HCT: 27.6 % — ABNORMAL LOW (ref 36.0–46.0)
Hemoglobin: 8.3 g/dL — ABNORMAL LOW (ref 12.0–15.0)
MCH: 28.8 pg (ref 26.0–34.0)
MCHC: 30.1 g/dL (ref 30.0–36.0)
MCV: 95.8 fL (ref 80.0–100.0)
Platelets: 181 10*3/uL (ref 150–400)
RBC: 2.88 MIL/uL — ABNORMAL LOW (ref 3.87–5.11)
RDW: 17.7 % — ABNORMAL HIGH (ref 11.5–15.5)
WBC: 8.6 10*3/uL (ref 4.0–10.5)
nRBC: 0 % (ref 0.0–0.2)

## 2022-04-15 LAB — RENAL FUNCTION PANEL
Albumin: 3.2 g/dL — ABNORMAL LOW (ref 3.5–5.0)
Anion gap: 11 (ref 5–15)
BUN: 78 mg/dL — ABNORMAL HIGH (ref 6–20)
CO2: 24 mmol/L (ref 22–32)
Calcium: 9.2 mg/dL (ref 8.9–10.3)
Chloride: 101 mmol/L (ref 98–111)
Creatinine, Ser: 12.53 mg/dL — ABNORMAL HIGH (ref 0.44–1.00)
GFR, Estimated: 3 mL/min — ABNORMAL LOW (ref >60)
Glucose, Bld: 102 mg/dL — ABNORMAL HIGH (ref 70–99)
Phosphorus: 7 mg/dL — ABNORMAL HIGH (ref 2.5–4.6)
Potassium: 6.6 mmol/L (ref 3.5–5.1)
Sodium: 136 mmol/L (ref 135–145)

## 2022-04-15 MED ORDER — SODIUM THIOSULFATE 250 MG/ML IV SOLN
12.5000 g | INTRAVENOUS | Status: DC
  Administered 2022-04-15 – 2022-04-19 (×3): 12.5 g via INTRAVENOUS
  Filled 2022-04-15 (×6): qty 50

## 2022-04-15 NOTE — Progress Notes (Signed)
Received patient in bed to unit.  Alert and oriented.  Informed consent signed and in chart.   Treatment initiated: 14:25 Treatment completed: 18:54  Patient tolerated well.  Transported back to the room  Alert, without acute distress.  Hand-off given to patient's nurse.   Access used: left hemodialysis catheter Access issues: none  Total UF removed: 3.0L Medication(s) given: oxy, sodium thiosulfate   04/15/22 1854  Vitals  Temp (!) 97.4 F (36.3 C)  Temp Source Oral  BP 136/81  MAP (mmHg) 99  BP Location Right Wrist  BP Method Automatic  Patient Position (if appropriate) Lying  Pulse Rate 66  Pulse Rate Source Monitor  ECG Heart Rate 66  Resp 13  Oxygen Therapy  SpO2 100 %  O2 Device Nasal Cannula  O2 Flow Rate (L/min) 2 L/min  During Treatment Monitoring  HD Safety Checks Performed Yes  Intra-Hemodialysis Comments Tolerated well;Tx completed  Dialysis Fluid Bolus Normal Saline  Bolus Amount (mL) 300 mL      Cariana Karge S [facility]

## 2022-04-15 NOTE — Progress Notes (Signed)
PROGRESS NOTE    Courtney Gray  PNT:[id number] DOB: October 27, 1970 DOA: 11/01/2021 PCP: Monico Blitz, MD  52 year old F with PMH of ESRD on HD TTS, recent hospitalization for bacteremia, hepatic and splenic abscess on long-term IV antibiotics, diastolic CHF, PVD s/p bilateral AKA, IDDM-2, morbid obesity and chronic sacral wound presenting with progressive shortness of breath in the setting of missed dialysis and admitted for hypertensive emergency.   -Discharge is pending ability to obtain outpatient hemodialysis that can accommodate patient's mobility constraints secondary to decubitus ulcer wound and bilateral AKAs.   Subjective: Refused dialysis yesterday, apparently was too late  Assessment and Plan:  End stage renal disease ([place]) -Nephrology following, HD today for missed dialysis yesterday and then back TTS -EPO, MBD management per Nephrology -Unable to find outpatient dialysis center due to body habitus and wound, inability to sit and tolerate full HD -Now working with PT, increasing tolerance sitting in chair, tolerated 4 hours in power chair twice this week -Continue midodrine with HD  Chronic history of sacral decubitus wound with chronic osteomyelitis -Seen by ID, started on doxycycline 11/28 which caused diarrhea, now off all antibiotics -Patient declined antibiotics, due to her concerns about GI side effects, she would like to continue wound care and watchful waiting at this time, doxycycline discontinued 12/1   Calciphylaxis - Na thiosulfate per Nephrology - Continue oxycodone regimen   Hypertension - Continue midodrine on HD days    Phantom limb pain - Continue new Lyrica, - Reduce gabapentin   Decubitus ulcer of sacral region, stage 3 ([place]) - WOC   Hypothyroidism - Continue levothyroxine   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   PVD (peripheral vascular disease) ([place]) History of Bilateral AKA   Class 3 obesity (HCC) BMI >40    Chronic respiratory failure Due to morbid obesity  DVT prophylaxis: none Code Status: Full Code Family Communication: None present Disposition Plan: Unknown  Consultants:    Procedures:   Antimicrobials:    Objective: Vitals:   04/13/22 2017 04/14/22 0850 04/14/22 2221 04/15/22 0528  BP: (!) 148/66 (!) 175/76 (!) 159/71 (!) 157/98  Pulse: 65 70 90 74  Resp: 18 16 18 18   Temp: 98.3 F (36.8 C) 98 F (36.7 C) 98.6 F (37 C) 98.1 F (36.7 C)  TempSrc: Oral Oral Oral Oral  SpO2: 100% 99% 98% 100%  Weight:    123 kg  Height:        Intake/Output Summary (Last 24 hours) at 04/15/2022 1331 Last data filed at 04/15/2022 0900 Gross per 24 hour  Intake 600 ml  Output 0 ml  Net 600 ml   Filed Weights   04/11/22 1446 04/11/22 1800 04/15/22 0528  Weight: 121.9 kg 119.3 kg 123 kg    Examination:  General exam: Morbidly obese female sitting up in bed, AAOx3, no distress, on her phone CVS: S1-S2, regular rhythm Lungs: Clear bilaterally Abdomen: Soft, nontender, bowel sounds present, abdominal wounds with dressing Extremities: Bilateral BKA Psychiatry:  Mood & affect appropriate.     Data Reviewed:   CBC: Recent Labs  Lab 04/11/22 1457  WBC 8.1  HGB 8.1*  HCT 27.1*  MCV 94.1  PLT 086   Basic Metabolic Panel: Recent Labs  Lab 04/11/22 0500  NA 138  K 5.5*  CL 100  CO2 24  GLUCOSE 96  BUN 64*  CREATININE 10.90*  CALCIUM 9.2  PHOS 6.9*   GFR: Estimated Creatinine Clearance: 8 mL/min (A) (by C-G formula based on SCr of 10.9 mg/dL (H)). Liver Function Tests: Recent Labs  Lab 04/11/22 0500  ALBUMIN 3.2*   No results for input(s): "LIPASE", "AMYLASE" in the last 168 hours. No results for input(s): "AMMONIA" in the last 168 hours. Coagulation Profile: No results for input(s): "INR", "PROTIME" in the last 168 hours. Cardiac Enzymes: No  results for input(s): "CKTOTAL", "CKMB", "CKMBINDEX", "TROPONINI" in the last 168 hours. BNP (last 3 results) No results for input(s): "PROBNP" in the last 8760 hours. HbA1C: No results for input(s): "HGBA1C" in the last 72 hours. CBG: No results for input(s): "GLUCAP" in the last 168 hours. Lipid Profile: No results for input(s): "CHOL", "HDL", "LDLCALC", "TRIG", "CHOLHDL", "LDLDIRECT" in the last 72 hours. Thyroid Function Tests: No results for input(s): "TSH", "T4TOTAL", "FREET4", "T3FREE", "THYROIDAB" in the last 72 hours. Anemia Panel: No results for input(s): "VITAMINB12", "FOLATE", "FERRITIN", "TIBC", "IRON", "RETICCTPCT" in the last 72 hours. Urine analysis: No results found for: "COLORURINE", "APPEARANCEUR", "LABSPEC", "PHURINE", "GLUCOSEU", "HGBUR", "BILIRUBINUR", "KETONESUR", "PROTEINUR", "UROBILINOGEN", "NITRITE", "LEUKOCYTESUR" Sepsis Labs: @LABRCNTIP (procalcitonin:4,lacticidven:4)  )No results found for this or any previous visit (from the past 240 hour(s)).   Radiology Studies: No results found.   Scheduled Meds:  aspirin EC  81 mg Oral Daily   atorvastatin  10 mg Oral Daily   Chlorhexidine Gluconate Cloth  6 each Topical Q0600   darbepoetin (ARANESP) injection - DIALYSIS  200 mcg Subcutaneous Q Tue-1800   docusate sodium  100 mg Oral BID   famotidine  20 mg Oral Daily   feeding supplement  1 Container Oral TID BM   gabapentin  100 mg Oral QHS   Gerhardt's butt cream   Topical BID   levothyroxine  100 mcg Oral Q0600   midodrine  10 mg Oral Once per day on Tue Thu Sat   pantoprazole  40 mg Oral Daily   pregabalin  50 mg Oral Daily   sevelamer carbonate  4.8 g Oral TID WC   sucroferric oxyhydroxide  500 mg Oral TID WC   Continuous Infusions:  anticoagulant sodium citrate     sodium thiosulfate 12.5 g in sodium chloride 0.9 % 150 mL Infusion for Calciphylaxis       LOS: 165 days    Time spent: 75min  Domenic Polite, MD [facility]   04/15/2022,  1:31 PM

## 2022-04-15 NOTE — Progress Notes (Cosign Needed)
Subjective: Noted  patient refused dialysis yesterday and initially this a.m. refused dialysis when I asked her this morning she stated she would have dialysis today.  Noted her last hemodialysis was 04/11/2022 secondary to patient refusing HD.  Objective Vital signs in last 24 hours: Vitals:   04/13/22 2017 04/14/22 0850 04/14/22 2221 04/15/22 0528  BP: (!) 148/66 (!) 175/76 (!) 159/71 (!) 157/98  Pulse: 65 70 90 74  Resp: 18 16 18 18   Temp: 98.3 F (36.8 C) 98 F (36.7 C) 98.6 F (37 C) 98.1 F (36.7 C)  TempSrc: Oral Oral Oral Oral  SpO2: 100% 99% 98% 100%  Weight:    123 kg  Height:       Weight change:   Physical Exam: General: Alert obese chronically ill-appearing adult female NAD Heart: RRR no MRG appreciated Lungs: CTA bilaterally nonlabored breathing on nasal cannula oxygen Abdomen: Obese NABS, soft, nondistended minimally tender at calciphylaxis sites Extremities:  hip edema and 1+ lateral AKA stump edema Dialysis Access: Right IJ TDC   Problem/Plan: Debility: Has been unable to tolerate dialysis in recliner and therefore unable to discharge to outpatient dialysis. Per her report has lost >50 lbs during admission. Sacral decub ulcers remain a barrier. She now has new powerchair/cushion and has been sitting for longer periods.  Appreciate efforts from PT team.  ESRD: Was usual TTS schedule .  Refusing HD this past week multiple times .Marland Kitchen Last HD was 04/11/22. Refuses back to back treatment on 1/6 so next dialysis will be today now will schedule back to TTS HD access: new TDC placed 02/04/2022, TDC stopped working again 11/30. IR consulted - S/p venogram, angioplasty of SVC and innominate veins, and TDC exchange. No recent issues reported.  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 Started empirically on sodium thiosulfate, but caused vomiting so stopped - now getting 1/2 dose - 12.5mg  q  HD, refuses increased dose. Not on any calcium based products or vitamin D. HypoTN/volume: On midodrine 10mg  qHD, with 10mg  prn mid HD w/parameters to keep SBP >100 d/t becoming symptomatic when it drops lower. Significant weight loss this admit Severe ischial sacral decubitus wound: CT showing chronic osteomyelitis. Limiting her, noted to be slow healing. Goal to tolerate sitting upright - which she is doing with PT.  COPD/Chronic O2 use: Prior O2 at home (5L/min) -> tapered to 2-3L/min here Anemia of ESRD: Last on January 5 Hgb 8.1, on max dose Aranesp 267mcg weekly. Recent Fe load completed. Hgb drifted down to 6.9,  received 1 unit PRBC, Checking Fe studies next HD Secondary hyperparathyroidism: On two binders. CorrCa elevated. Not on VDRA, last PTH 86 (low) on 03/11/22. Avoid all calcium based medications for Vitamin D d/t concern for calciphylaxis.  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 Nutrition -ALB 3.2 diet continues to be changed from renal.  Currently on regular diet.  Would really prefer she be on a renal diet but patient has been refusing Disposition: Difficult situation. Prior to admit, patient required 6 staff members and 2 hoyer pads for transfer into HD chair. Could not stay on HD for full tx. 2/2 pain of multiple decubitus ulcers and PTAR unable to lift pt into ambulance for transport.SW working on LTC at [employer]. Will need to tolerate 5-6hrs in a chair to tolerate OP dialysis including transport, treatment and cannulation time.  The main issue is her Rt  ischial wound. She now has new power chair and cushion. She has been sitting in the chair for longer periods. Up to 4 hours past  week  Ernest Haber, PA-C [facility] Beeper [phone] 04/15/2022,12:28 PM  LOS: 165 days   Labs: Basic Metabolic Panel: Recent Labs  Lab 04/11/22 0500  NA 138  K 5.5*  CL 100  CO2 24  GLUCOSE 96  BUN 64*  CREATININE 10.90*   CALCIUM 9.2  PHOS 6.9*   Liver Function Tests: Recent Labs  Lab 04/11/22 0500  ALBUMIN 3.2*   No results for input(s): "LIPASE", "AMYLASE" in the last 168 hours. No results for input(s): "AMMONIA" in the last 168 hours. CBC: Recent Labs  Lab 04/11/22 1457  WBC 8.1  HGB 8.1*  HCT 27.1*  MCV 94.1  PLT 184   Cardiac Enzymes: No results for input(s): "CKTOTAL", "CKMB", "CKMBINDEX", "TROPONINI" in the last 168 hours. CBG: No results for input(s): "GLUCAP" in the last 168 hours.  Studies/Results: No results found. Medications:  anticoagulant sodium citrate     sodium thiosulfate 12.5 g in sodium chloride 0.9 % 150 mL Infusion for Calciphylaxis      aspirin EC  81 mg Oral Daily   atorvastatin  10 mg Oral Daily   Chlorhexidine Gluconate Cloth  6 each Topical Q0600   darbepoetin (ARANESP) injection - DIALYSIS  200 mcg Subcutaneous Q Tue-1800   docusate sodium  100 mg Oral BID   famotidine  20 mg Oral Daily   feeding supplement  1 Container Oral TID BM   gabapentin  100 mg Oral QHS   Gerhardt's butt cream   Topical BID   levothyroxine  100 mcg Oral Q0600   midodrine  10 mg Oral Once per day on Tue Thu Sat   pantoprazole  40 mg Oral Daily   pregabalin  50 mg Oral Daily   sevelamer carbonate  4.8 g Oral TID WC   sucroferric oxyhydroxide  500 mg Oral TID WC

## 2022-04-15 NOTE — Progress Notes (Signed)
PT Cancellation Note  Patient Details Name: Courtney Gray MRN: [id number] DOB: [birth year]   Cancelled Treatment:    Reason Eval/Treat Not Completed: Patient at procedure or test/unavailable  Checked with patient for physical therapy session this morning. Requested PT follow-up in afternoon. Patient now off unit for dialysis (notes indicate pt has been refusing dialysis and has not been since 04/11/22.)   Will continue to follow and progress as tolerated.  Candie Mile, PT, DPT Physical Therapist Acute Rehabilitation Services [facility]    04/15/2022, 2:16 PM

## 2022-04-16 DIAGNOSIS — L89312 Pressure ulcer of right buttock, stage 2: Secondary | ICD-10-CM | POA: Diagnosis not present

## 2022-04-16 LAB — CBC
HCT: 29.7 % — ABNORMAL LOW (ref 36.0–46.0)
Hemoglobin: 9.1 g/dL — ABNORMAL LOW (ref 12.0–15.0)
MCH: 28.2 pg (ref 26.0–34.0)
MCHC: 30.6 g/dL (ref 30.0–36.0)
MCV: 92 fL (ref 80.0–100.0)
Platelets: 201 10*3/uL (ref 150–400)
RBC: 3.23 MIL/uL — ABNORMAL LOW (ref 3.87–5.11)
RDW: 17.2 % — ABNORMAL HIGH (ref 11.5–15.5)
WBC: 8.2 10*3/uL (ref 4.0–10.5)
nRBC: 0 % (ref 0.0–0.2)

## 2022-04-16 LAB — RENAL FUNCTION PANEL
Albumin: 3.2 g/dL — ABNORMAL LOW (ref 3.5–5.0)
Anion gap: 14 (ref 5–15)
BUN: 35 mg/dL — ABNORMAL HIGH (ref 6–20)
CO2: 26 mmol/L (ref 22–32)
Calcium: 9.6 mg/dL (ref 8.9–10.3)
Chloride: 94 mmol/L — ABNORMAL LOW (ref 98–111)
Creatinine, Ser: 6.85 mg/dL — ABNORMAL HIGH (ref 0.44–1.00)
GFR, Estimated: 7 mL/min — ABNORMAL LOW (ref >60)
Glucose, Bld: 91 mg/dL (ref 70–99)
Phosphorus: 5 mg/dL — ABNORMAL HIGH (ref 2.5–4.6)
Potassium: 5 mmol/L (ref 3.5–5.1)
Sodium: 134 mmol/L — ABNORMAL LOW (ref 135–145)

## 2022-04-16 NOTE — Evaluation (Signed)
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   Clinical Impression   New OT consult placed as pt progressing with wound healing, transfers to power wheelchair and hopeful for transitional DC to a home environment. Pt pleasant, eager to participate and demo Min A (+2 for safety initially) AP transfer to power chair. Extended time spent discussing toileting options, LB dressing to avoid shearing of skin, and need for pt inquiry of accessible housing (first floor apartments, etc). Feel pt appropriate to SNF rehab though has been adamantly declining this. Will follow acutely on a trial basis to assess for ADL progress and potential for DC home as OT previously signed off Sept 2023.       Recommendations for follow up therapy are one component of a multi-disciplinary discharge planning process, led by the attending physician.  Recommendations may be updated based on patient status, additional functional criteria and insurance authorization.   Follow Up Recommendations  Other (comment) (Pt declining SNF; hopeful for [facility] services once housing obtaining)     Assistance Recommended at Discharge Frequent or constant Supervision/Assistance  Patient can return home with the following A little help with walking and/or transfers;A lot of help with bathing/dressing/bathroom    Functional Status Assessment  Patient has had a recent decline in their functional status and demonstrates the ability to make significant improvements in function in a reasonable and predictable amount  of time.  Equipment Recommendations  Other (comment) (Power wheelchair; drop arm bariatric BSC)    Recommendations for Other Services       Precautions / Restrictions Precautions Precautions: Fall Precaution Comments: bil AKA, 2L O2 at baseline (has been tolerating mobility in power chair on RoomAir fine); when in power chair, MUST perform pressure relief every 30 minutes by tilting back for 10 minutes Restrictions Weight Bearing Restrictions: Yes RLE Weight Bearing: Non weight bearing LLE Weight Bearing: Non weight bearing      Mobility Bed Mobility Overal bed mobility: Modified Independent             General bed mobility comments: to come to long sitting and prepare for AP transfer    Transfers Overall transfer level: Needs assistance Equipment used: None Transfers: Bed to chair/wheelchair/BSC         Anterior-Posterior transfers: Min assist, +2 safety/equipment   General transfer comment: Min A x 2 for safety in AP transfer to power chair with assist for bed pad to prevent shearing, could likely complete with +1 in next session      Balance Overall balance assessment: Needs assistance Sitting-balance support: No upper extremity supported, Feet supported Sitting balance-[name] Scale: Good                                     ADL either performed or assessed with clinical judgement   ADL Overall ADL's : Needs assistance/impaired Eating/Feeding: Independent;Bed level;Sitting   Grooming: Set up;Bed level;Sitting   Upper Body Bathing: Set up;Bed level;Sitting   Lower Body Bathing: Moderate assistance;Sitting/lateral leans;Bed level   Upper Body  Dressing : Set up;Sitting;Bed level   Lower Body Dressing: Sitting/lateral leans;Bed level;Maximal assistance       Toileting- Clothing Manipulation and Hygiene: Maximal assistance;Bed level         General ADL Comments: Focus on AP transfer to power chair, assessment of current functioning, pt  goals for DC and DC location options. Discussed toilet transfer options to/from power chair and BSC options, LB dressing options to avoid shearing of bottom     Vision Baseline Vision/History: 1 Wears glasses Ability to See in Adequate Light: 0 Adequate Patient Visual Report: No change from baseline Vision Assessment?: No apparent visual deficits     Perception     Praxis      Pertinent Vitals/Pain Pain Assessment Pain Assessment: Faces Faces Pain Scale: Hurts a little bit Pain Location: R abdomen Pain Descriptors / Indicators: Grimacing, Discomfort Pain Intervention(s): Monitored during session     Hand Dominance Left   Extremity/Trunk Assessment Upper Extremity Assessment Upper Extremity Assessment: Overall WFL for tasks assessed   Lower Extremity Assessment Lower Extremity Assessment: Defer to PT evaluation   Cervical / Trunk Assessment Cervical / Trunk Assessment: Other exceptions Cervical / Trunk Exceptions: increased body habitus   Communication Communication Communication: No difficulties   Cognition Arousal/Alertness: Awake/alert Behavior During Therapy: WFL for tasks assessed/performed Overall Cognitive Status: Within Functional Limits for tasks assessed                                       General Comments       Exercises     Shoulder Instructions      Home Living Family/patient expects to be discharged to:: Unsure Living Arrangements: Children Available Help at Discharge: Family;Available PRN/intermittently Type of Home: Apartment Home Access: Ramped entrance Entrance Stairs-Number of Steps: 2 Entrance Stairs-Rails: None Home Layout: One level     Bathroom Shower/Tub: Tub/shower unit;Curtain   Bathroom Toilet: Standard Bathroom Accessibility: No   Home Equipment: Hand held shower head;Shower seat;Cane - single point;Grab bars - tub/shower;Rollator (4 wheels);Hospital bed   Additional Comments: from prior living  situation. pt reports unable to DC back to this location and daughter currently staying in a motel. Pt hopeful to find a first floor apartment or accessible home      Prior Functioning/Environment Prior Level of Function : Needs assist             Mobility Comments: was previously using lift for OOB w/ daughter help d/t chronic LE difficulties (prior to amputations) ADLs Comments: Daughter assists with bed-level ADL tasks; prior to amputations        OT Problem List: Decreased strength;Decreased range of motion;Decreased activity tolerance;Impaired balance (sitting and/or standing);Decreased coordination;Decreased cognition;Decreased safety awareness;Decreased knowledge of use of DME or AE;Decreased knowledge of precautions;Obesity;Pain      OT Treatment/Interventions: Therapeutic exercise;Self-care/ADL training;Energy conservation;DME and/or AE instruction;Manual therapy;Therapeutic activities;Cognitive remediation/compensation;Patient/family education;Balance training    OT Goals(Current goals can be found in the care plan section) Acute Rehab OT Goals Patient Stated Goal: find an apartment, regain independence OT Goal Formulation: With patient Time For Goal Achievement: 04/30/22 Potential to Achieve Goals: Good  OT Frequency: Min 2X/week    Co-evaluation PT/OT/SLP Co-Evaluation/Treatment: Yes Reason for Co-Treatment: Other (comment) (faciliate DC planning/staff education for carryover)   OT goals addressed during session: ADL's and self-care;Strengthening/ROM      AM-PAC OT "6 Clicks" Daily Activity     Outcome Measure Help  from another person eating meals?: None Help from another person taking care of personal grooming?: A Little Help from another person toileting, which includes using toliet, bedpan, or urinal?: A Lot Help from another person bathing (including washing, rinsing, drying)?: A Lot Help from another person to put on and taking off regular upper body  clothing?: A Little Help from another person to put on and taking off regular lower body clothing?: A Lot 6 Click Score: 16   End of Session Equipment Utilized During Treatment: Oxygen Nurse Communication: Mobility status  Activity Tolerance: Patient tolerated treatment well Patient left: Other (comment) (in power chair w/ PT)  OT Visit Diagnosis: Muscle weakness (generalized) (M62.81);History of falling (Z91.81) Pain - Right/Left: Right Pain - part of body: Leg                Time: 0638-6854 OT Time Calculation (min): 37 min Charges:  OT General Charges $OT Visit: 1 Visit OT Evaluation $OT Eval Moderate Complexity: 1 Mod  Malachy Chamber, OTR/L Acute Rehab Services Office: [phone]   Layla Maw 04/16/2022, 1:02 PM

## 2022-04-16 NOTE — Plan of Care (Signed)

## 2022-04-16 NOTE — Progress Notes (Signed)
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    PT Comments    Continuing work on functional mobility and activity tolerance;  Initiated session with pt telling me about recent meetings with TOC where they pulled together a plan to dc to a residence rather than to SNF; Courtney Gray has shown excellent progress over the past 2 weeks, and she has been able to get up and to the power chair with min assist (assist for bed pad management); she has tolerated being OOB and up in the power chair consistently for 4 hours at a time; OT consult was placed for ADLs with a particular focus on the transition to a private residence instead of a SNF;   Courtney Gray is showing more ownership of her care, and is taking on responsibility to educate her caregivers in how to use the power chair, too; Still needed a cue to set a timer for pressure relief, but she received the cue well, and promptly set the timer;   While she is a bit daunted at the task of securing housing for herself and her daughter, she is also quite hopeful to regain independence.   Recommendations for follow up therapy are one component of a multi-disciplinary discharge planning process, led by the attending physician.  Recommendations may be updated based on patient status, additional functional criteria and insurance authorization.  Follow Up Recommendations  Home health PT (consider PACE services vs SNF pending housing) Can patient physically be transported by private vehicle: No   Assistance Recommended at Discharge  Intermittent Supervision/Assistance  Patient can return home with the following A little help with walking and/or transfers;Help with stairs or ramp for entrance;Assist for transportation (medical [name] Lei transport)   Financial risk analyst (measurements PT);Wheelchair cushion (measurements PT);Other (comment);Hospital bed (more accomodating mechanical lift, with sling that handles pt's with bil AKAs;  cushion (20x24), power wheelchair with power tilt and recline)    Recommendations for Other Services       Precautions / Restrictions Precautions Precautions: Fall Precaution Comments: bil AKA, 2L O2 at baseline (has been tolerating mobility in power chair on RoomAir fine); when in power chair, MUST perform pressure relief every 30 minutes by tilting back for 10 minutes Restrictions Weight Bearing Restrictions: Yes RLE Weight Bearing: Non weight bearing LLE Weight Bearing: Non weight bearing Other Position/Activity Restrictions: when in power chair, MUST perform pressure relief every 30 minutes by tilting back for 10 minutes     Mobility  Bed Mobility Overal bed mobility: Modified Independent             General bed mobility comments: to come to long sitting and prepare for AP transfer    Transfers Overall transfer level: Needs assistance Equipment used:  (bed pad) Transfers: Bed to chair/wheelchair/BSC         Anterior-Posterior transfers: Min assist, +2 safety/equipment   General transfer comment: Min A x 2 for safety in AP transfer to power chair with assist for bed pad to prevent shearing, could likely complete with +1 in next session    Ambulation/Gait  Stairs             Wheelchair Mobility    Modified Rankin (Stroke Patients Only)       Balance     Sitting balance-[name] Scale: Good                                      Cognition Arousal/Alertness: Awake/alert Behavior During Therapy: WFL for  tasks assessed/performed Overall Cognitive Status: Within Functional Limits for tasks assessed                                 General Comments: Able to verbalize the need to tilt for pressure relief and demonstrate how to safely use power chair controls etc/set up for transfer        Exercises      General Comments General comments (skin integrity, edema, etc.): Discussed dc planning with pt and OT in terms of ADLs; Will ask daughter to bring biker type shorts to work with      Pertinent Vitals/Pain Pain Assessment Pain Assessment: Faces Faces Pain Scale: Hurts a little bit Pain Location: R abdomen Pain Descriptors / Indicators: Grimacing, Discomfort Pain Intervention(s): Monitored during session    Home Living Family/patient expects to be discharged to:: Unsure Living Arrangements: Children Available Help at Discharge: Family;Available PRN/intermittently Type of Home: Apartment Home Access: Ramped entrance Entrance Stairs-Rails: None Entrance Stairs-Number of Steps: 2   Home Layout: One level Home Equipment: Hand held shower head;Shower seat;Cane - single point;Grab bars - tub/shower;Rollator (4 wheels);Hospital bed Additional Comments: from prior living situation. pt reports unable to DC back to this location and daughter currently staying in a motel. Pt hopeful to find a first floor apartment or accessible home    Prior Function            PT Goals (current goals can now be found in the care plan section) Acute Rehab PT Goals Patient Stated Goal: Pt tells this PT the goal is to get home with her daughter PT Goal Formulation: With patient Time For Goal Achievement: 04/19/22 Potential to Achieve Goals: Good Progress towards PT goals: Progressing toward goals    Frequency    Min 3X/week (Will consider min 3x/week if solid plan for housing is established)      PT Plan Discharge plan needs to be updated;Frequency needs to be updated     Co-evaluation PT/OT/SLP Co-Evaluation/Treatment: Yes Reason for Co-Treatment: Other (comment) (facilitate dc planning/staff education carryover) PT goals addressed during session: Mobility/safety with mobility OT goals addressed during session: ADL's and self-care;Strengthening/ROM      AM-PAC PT "6 Clicks" Mobility   Outcome Measure  Help needed turning from your back to your side while in a flat bed without using bedrails?: None Help needed moving from lying on your back to sitting on the side of a flat bed without using bedrails?: None Help needed moving to and from a bed to a chair (including a wheelchair)?: A Little Help needed standing up from a chair using your arms (e.g., wheelchair or bedside chair)?: Total Help needed to walk in hospital room?: Total Help needed climbing 3-5 steps with a railing? : Total 6 Click Score: 14    End of Session Equipment Utilized During Treatment: Other (comment) (bed pad) Activity Tolerance: Patient tolerated treatment well Patient left: Other (comment) (in power chair) Nurse Communication:  Mobility status;Other (comment) (and brief demonstration of power chair function) PT Visit Diagnosis: Other abnormalities of gait and mobility (R26.89);Muscle weakness (generalized) (M62.81) Pain - Right/Left: Right Pain - part of body: Hip;Leg (ischial tuberosity; R abdominal nodular pain)     Time: 5672-0919 PT Time Calculation (min) (ACUTE ONLY): 64 min  Charges:  $Therapeutic Activity: 23-37 mins $Self Care/Home Management: [place] Office [phone]    Colletta [place] 04/16/2022, 3:53 PM

## 2022-04-16 NOTE — Progress Notes (Signed)
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    PT Comments    Pt was out and about on the unit, and this PT met with her in the hallway; We continued discussion re: updated DC plan to residence, and provided pt with encouragement in finding accessible residence; Shifted the conversation to access to Outpt HD; Discussed medical [name] Lei transport to HD center, and ways to transfer from power chair to HD recliner; while ant post is an option, I'm less keen on it for putting extra forces across the ischial wound, and it would be much harder to get the cushion into the HD recliner; If she uses a lift, the cushion can be placed in the recliner while she is lifted, and while there is potential for some pressure from the sling, it is quite temporary, and likely less than forces on the wound with scooting;   Will consider using the [facility] for transfering to HD recliner over the next few sessions; l Measured the HD recliners here at 19 inches wide; Not sure how well Terren will tolerate HD in the HD recliners we have -- her seat width measured at least 23 inches for the power chair; Worth considering getting wide/bari HD recliners here for the hospital (I anticipate having a wide HD recliner will serve more patients than just [place]);   Requested pt ask her daughter to bring biker shorts;   Would it be possible for pt to use one of the floor's computers to help in the search for accessible housing? While she was happy to be up and OOB, it sounds like she got a little bored, and  was disappointed that she couldn't help more at the nurse's station desk.  Recommendations for follow up therapy are one component of a multi-disciplinary discharge planning process, led by the attending physician.  Recommendations may be updated based on patient status, additional functional criteria and insurance authorization.  Follow Up Recommendations  Home health PT (consider PACE services vs SNF pending housing) Can patient physically be transported by private vehicle: No   Assistance Recommended at Discharge Intermittent Supervision/Assistance  Patient can return home with the following A little help with walking and/or transfers;Help with stairs or ramp for entrance;Assist for transportation (medical [name] Lei transport)   Financial risk analyst (measurements PT);Wheelchair cushion (measurements PT);Other (comment);Hospital bed (more accomodating mechanical lift, with sling that handles pt's with bil AKAs;  cushion (20x24), power wheelchair with power tilt and recline)    Recommendations for Other Services       Precautions / Restrictions Precautions Precautions: Fall Precaution Comments: bil AKA, 2L O2 at baseline (has been tolerating mobility in power chair on [employer] fine); when in power chair, MUST perform pressure relief every 30 minutes by tilting back for 10 minutes Restrictions Other Position/Activity Restrictions: when in power chair, MUST perform pressure relief every 30 minutes by tilting back for 10 minutes     Mobility  Bed Mobility Overal bed mobility: Modified Independent             General bed mobility comments: to come to long sitting and  prepare for AP transfer    Transfers Overall transfer level: Needs assistance Equipment used:  (bed pad) Transfers: Bed to chair/wheelchair/BSC         Anterior-Posterior transfers: Min assist, +2 safety/equipment   General transfer comment: Min A x 2 for safety in AP transfer to power chair with  assist for bed pad to prevent shearing, could likely complete with +1 in next session    Ambulation/Gait                   Theme park manager mobility: Yes Wheelchair propulsion:  (Power) Wheelchair parts: Independent Distance: around 27m Wheelchair Assistance Details (indicate cue type and reason): Able to verbally state/demonstrate modes on chair, setup, safety measure/speeds, turning off w/c when stopping, alignment and pressure relief schedule. Did well navigating obstacles, traffic in hallway and demonstrated good carry over for scanning environment, and stopping in doorway to make sure traffic is clear and there is clearance to proceed safely.  Modified Rankin (Stroke Patients Only)       Balance     Sitting balance-[name] Scale: Good                                      Cognition Arousal/Alertness: Awake/alert Behavior During Therapy: WFL for tasks assessed/performed Overall Cognitive Status: Within Functional Limits for tasks assessed                                 General Comments: Able to verbalize the need to tilt for pressure relief and demonstrate how to safely use power chair controls etc/set up for transfer        Exercises      General Comments General comments (skin integrity, edema, etc.): Discussion this session focused on considerations re: access to Outpt HD      Pertinent Vitals/Pain Pain Assessment Pain Assessment: Faces Faces Pain Scale: Hurts a little bit Pain Location: R abdomen Pain Descriptors / Indicators: Grimacing, Discomfort Pain Intervention(s): Monitored during session    Home Living                          Prior Function            PT Goals (current goals can now be found in the care plan section) Acute Rehab PT Goals Patient Stated Goal: Obtain housing PT Goal Formulation: With patient Time For Goal Achievement:  04/19/22 Potential to Achieve Goals: Good Progress towards PT goals: Progressing toward goals    Frequency    Min 3X/week      PT Plan Current plan remains appropriate (pt and her daughter must work on finding accessible housing)    Co-evaluation              AM-PAC PT "6 Clicks" Mobility   Outcome Measure  Help needed turning from your back to your side while in a flat bed without using bedrails?: None Help needed moving from lying on your back to sitting on the side of a flat bed without using bedrails?: None Help needed moving to and from a bed to a chair (including a wheelchair)?: A Little Help needed standing up from a chair using your arms (e.g., wheelchair or bedside chair)?: Total Help needed  to walk in hospital room?: Total Help needed climbing 3-5 steps with a railing? : Total 6 Click Score: 14    End of Session   Activity Tolerance: Patient tolerated treatment well Patient left: Other (comment) (in power chair) Nurse Communication: Mobility status PT Visit Diagnosis: Other abnormalities of gait and mobility (R26.89);Muscle weakness (generalized) (M62.81) Pain - Right/Left: Right Pain - part of body:  (R abdomen)     Time: 1445-1510 PT Time Calculation (min) (ACUTE ONLY): 25 min  Charges:  $Self Care/Home Management: [place] Office [phone]    Colletta [place] 04/16/2022, 8:01 PM

## 2022-04-16 NOTE — Progress Notes (Signed)
Subjective: Patient seen in wheelchair at nurses station, denies problems with dialysis yesterday  Objective Vital signs in last 24 hours: Vitals:   04/15/22 2028 04/16/22 0057 04/16/22 0532 04/16/22 0816  BP: (!) 153/115 (!) 187/81 (!) 156/49 (!) 155/83  Pulse: 84 (!) 41 75 70  Resp: 18  18 17   Temp: 98 F (36.7 C)  (!) 97.5 F (36.4 C) 98.8 F (37.1 C)  TempSrc: Oral  Oral Oral  SpO2: 93% 100% 100% 100%  Weight:      Height:       Weight change:   Physical Exam: General: Alert obese chronically ill female NAD Heart: RRR no MRG Lungs: CTA bilaterally nonlabored breathing Abdomen: BS NABS soft nondistended currently nontender Extremities: Trace edema and trace bilateral AKA stump edema  dialysis Access: Right IJ TDC  Problem/Plan: Debility: Has been unable to tolerate dialysis in recliner and therefore unable to discharge to outpatient dialysis. Per her report has lost >50 lbs during admission. Sacral decub ulcers remain a barrier. She now has new powerchair/cushion and has been sitting for longer periods.  Appreciate efforts from PT team.  ESRD: Was usual TTS schedule .  Refusing HD this past week multiple times .Marland Kitchen Last week HD was 04/11/22 till yesterday. now will schedule back to TTS HD access: new TDC placed 02/04/2022, TDC stopped working again 11/30. IR consulted - S/p venogram, angioplasty of SVC and innominate veins, and TDC exchange. No recent issues reported.  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 Started empirically on sodium thiosulfate, but caused vomiting so stopped - now getting 1/2 dose - 12.5mg  q HD, refuses increased dose. Not on any calcium based products or vitamin D. HypoTN/volume: On midodrine 10mg  qHD, with 10mg  prn mid HD w/parameters to keep SBP >100 d/t becoming symptomatic when it drops lower. Significant weight loss this admit Severe ischial sacral decubitus  wound: CT showing chronic osteomyelitis. Limiting her, noted to be slow healing. Goal to tolerate sitting upright - which she is doing with PT.  COPD/Chronic O2 use: Prior O2 at home (5L/min) -> tapered to 2-3L/min here Anemia of ESRD: Hgb 9.1 on max dose Aranesp 270mcg weekly. Recent Fe load completed.  Prior Hgb drifted down to 6.9,  received 1 unit PRBC, Secondary hyperparathyroidism: On two binders. CorrCa elevated. Not on VDRA, last PTH 86 (low) on 03/11/22. Avoid all calcium based medications for Vitamin D d/t concern for calciphylaxis.  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 Nutrition -ALB 3.2 diet continues to be changed from renal.  Currently on regular diet.  Would really prefer she be on a renal diet but patient has been refusing Disposition: Difficult situation. Prior to admit, patient required 6 staff members and 2 hoyer pads for transfer into HD chair. Could not stay on HD for full tx. 2/2 pain of multiple decubitus ulcers and PTAR unable to lift pt into ambulance for transport.SW working on LTC at [employer]. Will need to tolerate 5-6hrs in a chair to tolerate OP dialysis including transport, treatment and cannulation time.  The main issue is her Rt ischial wound. She now has new power chair and cushion. She has been sitting in the chair for longer periods. Up to 4 hours past  week  Ernest Haber, PA-C [place] [phone] 04/16/2022,4:24 PM  LOS: 166 days   Labs: Basic Metabolic Panel: Recent Labs  Lab 04/11/22 0500 04/15/22 1438  04/16/22 0640  NA 138 136 134*  K 5.5* 6.6* 5.0  CL 100 101 94*  CO2 24 24 26   GLUCOSE 96 102* 91  BUN 64* 78* 35*  CREATININE 10.90* 12.53* 6.85*  CALCIUM 9.2 9.2 9.6  PHOS 6.9* 7.0* 5.0*   Liver Function Tests: Recent Labs  Lab 04/11/22 0500 04/15/22 1438 04/16/22 0640  ALBUMIN 3.2* 3.2* 3.2*   No results for input(s): "LIPASE", "AMYLASE" in the last 168 hours. No results for  input(s): "AMMONIA" in the last 168 hours. CBC: Recent Labs  Lab 04/11/22 1457 04/15/22 1435 04/16/22 0640  WBC 8.1 8.6 8.2  HGB 8.1* 8.3* 9.1*  HCT 27.1* 27.6* 29.7*  MCV 94.1 95.8 92.0  PLT 184 181 201   Cardiac Enzymes: No results for input(s): "CKTOTAL", "CKMB", "CKMBINDEX", "TROPONINI" in the last 168 hours. CBG: No results for input(s): "GLUCAP" in the last 168 hours.  Studies/Results: No results found. Medications:  sodium thiosulfate 12.5 g in sodium chloride 0.9 % 150 mL Infusion for Calciphylaxis Stopped (04/15/22 1838)    aspirin EC  81 mg Oral Daily   atorvastatin  10 mg Oral Daily   Chlorhexidine Gluconate Cloth  6 each Topical Q0600   darbepoetin (ARANESP) injection - DIALYSIS  200 mcg Subcutaneous Q Tue-1800   docusate sodium  100 mg Oral BID   famotidine  20 mg Oral Daily   feeding supplement  1 Container Oral TID BM   gabapentin  100 mg Oral QHS   Gerhardt's butt cream   Topical BID   levothyroxine  100 mcg Oral Q0600   midodrine  10 mg Oral Once per day on Tue Thu Sat   pantoprazole  40 mg Oral Daily   pregabalin  50 mg Oral Daily   sevelamer carbonate  4.8 g Oral TID WC   sucroferric oxyhydroxide  500 mg Oral TID WC

## 2022-04-16 NOTE — Progress Notes (Signed)
PROGRESS NOTE    Courtney Gray  IOX:[id number] DOB: 1970/10/24 DOA: 11/01/2021 PCP: Monico Blitz, MD   Brief Narrative: 52 year old with past medical history significant for ESRD on hemodialysis TTS, recent hospitalization for bacteremia, hepatic and splenic abscess on long-term IV antibiotics, diastolic heart failure, PVD status post bilateral AKA, insulin-dependent diabetes type 2, morbid obesity and chronic sacral wound presents with progressive shortness of breath in the setting of missing hemodialysis and admitted for hypertensive emergency. Patient with long length of stay, discharge pending due to ability to obtain outpatient hemodialysis that can accumulate patient's mobility constraints secondary to decubitus ulcer wound and bilateral AKA.    Assessment & Plan:   Active Problems:   End stage renal disease ([place])   Hypertension   Decubitus ulcer of sacral region, stage 3 (HCC)   Type 2 diabetes mellitus with hyperlipidemia (HCC)   Chronic respiratory failure with hypoxia (HCC)   Bacteremia   PVD (peripheral vascular disease) (HCC)   Class 3 obesity (HCC)   Anemia due to chronic kidney disease   Hypertensive emergency   Leukocytosis   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HCC)   Pressure injury of skin   PAD (peripheral artery disease) (HCC)   Irritable bowel syndrome (IBS)   Amputation of left middle finger   GERD (gastroesophageal reflux disease)   Nodule of skin of abdomen   1-ESRD, HD: -Nephrology Following, TTS schedule. -Need outpatient dialysis center that is able to accommodate body habitus and wound. 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  She is now has been off of oral antibiotics. -Patient declined antibiotics, due to concern for GI issues.  She will continue with wound  care. -Doxycycline was discontinued 12/1  3-Calciphylaxis: Na-thiosulfate  per nephrology As needed oxycodone  4-Hypertension: Continue with midodrine on hemodialysis days  Phantom limb pain: Continue with Lyrica gabapentin has been reduced  Decubitus ulcer of sacral region stage III: Continue with wound care Hypothyroidism: Continue with Synthroid Diabetes type 2 with hyperlipidemia neuropathy: Continue with aspirin and statins Bacteremia: Patient has completed antibiotic therapy   PVD (peripheral vascular disease) ([place]) History of Bilateral AKA   Class 3 obesity (HCC) BMI >40    Chronic respiratory failure Due to morbid obesity      Pressure Injury 11/12/21 Hip Right Unstageable - Full thickness tissue loss in which the base of the injury is covered by slough (yellow, tan, gray, green or brown) and/or eschar (tan, brown or black) in the wound bed. 90% covered with slough (Active)  11/12/21 0800  Location: Hip  Location Orientation: Right  Staging: Unstageable - Full thickness tissue loss in which the base of the injury is covered by slough (yellow, tan, gray, green or brown) and/or eschar (tan, brown or black) in the wound bed.  Wound Description (Comments): 90% covered with slough  Present on Admission: Yes  Dressing Type Gauze (Comment) 04/15/22 2140     Pressure Injury 11/24/21 Flank Left;Lower Stage 2 -  Partial thickness loss of dermis presenting as a shallow open injury with a red, pink wound bed without slough. (Active)  11/24/21 0430  Location: Flank  Location Orientation: Left;Lower  Staging: Stage 2 -  Partial thickness loss of dermis presenting as a shallow open injury with a red, pink wound bed without slough.  Wound Description (Comments):   Present on Admission: No  Dressing Type Foam - Lift dressing to assess site every shift 04/16/22 1005                  Estimated body mass index is 45.12 kg/m as calculated from the following:   Height  as of this encounter: 5\' 5"  (1.651 m).   Weight as of this encounter: 123 kg.   DVT prophylaxis: Aspirin Code Status: Full code Family Communication: Care discussed with  patient Disposition Plan:  Status is: Inpatient Remains inpatient appropriate because: Awaiting out patient HD set up, she needs to be able to sit for HD    Consultants:  Nephrology ID   Procedures:    Antimicrobials:    Subjective: She is sitting wheelchair. Report pain in her wound thigh.   Objective: Vitals:   04/15/22 2028 04/16/22 0057 04/16/22 0532 04/16/22 0816  BP: (!) 153/115 (!) 187/81 (!) 156/49 (!) 155/83  Pulse: 84 (!) 41 75 70  Resp: 18  18 17   Temp: 98 F (36.7 C)  (!) 97.5 F (36.4 C) 98.8 F (37.1 C)  TempSrc: Oral  Oral Oral  SpO2: 93% 100% 100% 100%  Weight:      Height:        Intake/Output Summary (Last 24 hours) at 04/16/2022 1446 Last data filed at 04/16/2022 1300 Gross per 24 hour  Intake 938.47 ml  Output 3000 ml  Net -2061.53 ml   Filed Weights   04/11/22 1446 04/11/22 1800 04/15/22 0528  Weight: 121.9 kg 119.3 kg 123 kg    Examination:  General exam: Appears calm and comfortable  Respiratory system: Clear to auscultation. Respiratory effort normal. Cardiovascular system: S1 & S2 heard, RRR.  Gastrointestinal system: Abdomen is nondistended, soft and nontender.  Central nervous system: Alert and oriented. Extremities: BL AKA Skin: multiples wound with dressing.   Data Reviewed: I have personally reviewed following labs and imaging studies  CBC: Recent Labs  Lab 04/11/22 1457 04/15/22 1435 04/16/22 0640  WBC 8.1 8.6 8.2  HGB 8.1* 8.3* 9.1*  HCT 27.1* 27.6* 29.7*  MCV 94.1 95.8 92.0  PLT 184 181 932   Basic Metabolic Panel: Recent Labs  Lab 04/11/22 0500 04/15/22 1438 04/16/22 0640  NA 138 136 134*  K 5.5* 6.6* 5.0  CL 100 101 94*  CO2 24 24 26   GLUCOSE 96 102* 91  BUN 64* 78* 35*  CREATININE 10.90* 12.53* 6.85*  CALCIUM 9.2 9.2 9.6   PHOS 6.9* 7.0* 5.0*   GFR: Estimated Creatinine Clearance: 12.8 mL/min (A) (by C-G formula based on SCr of 6.85 mg/dL (H)). Liver Function Tests: Recent Labs  Lab 04/11/22 0500 04/15/22 1438 04/16/22 0640  ALBUMIN 3.2* 3.2* 3.2*   No results for input(s): "LIPASE", "AMYLASE" in the last 168 hours. No results for input(s): "AMMONIA" in the last 168 hours. Coagulation Profile: No results for input(s): "INR", "PROTIME" in the last 168 hours. Cardiac Enzymes: No results for input(s): "CKTOTAL", "CKMB", "CKMBINDEX", "TROPONINI" in the last 168 hours. BNP (last 3 results) No results for input(s): "PROBNP" in the last 8760 hours. HbA1C: No results for input(s): "HGBA1C" in the last 72 hours. CBG: No results for input(s): "GLUCAP" in the last 168 hours. Lipid Profile: No results for input(s): "CHOL", "HDL", "LDLCALC", "TRIG", "CHOLHDL", "LDLDIRECT" in the last 72 hours. Thyroid Function Tests: No results for input(s): "TSH", "T4TOTAL", "FREET4", "T3FREE", "THYROIDAB" in the last 72 hours. Anemia Panel: No results for input(s): "VITAMINB12", "FOLATE", "FERRITIN", "TIBC", "IRON", "RETICCTPCT" in the last 72 hours. Sepsis  Labs: No results for input(s): "PROCALCITON", "LATICACIDVEN" in the last 168 hours.  No results found for this or any previous visit (from the past 240 hour(s)).       Radiology Studies: No results found.      Scheduled Meds:  aspirin EC  81 mg Oral Daily   atorvastatin  10 mg Oral Daily   Chlorhexidine Gluconate Cloth  6 each Topical Q0600   darbepoetin (ARANESP) injection - DIALYSIS  200 mcg Subcutaneous Q Tue-1800   docusate sodium  100 mg Oral BID   famotidine  20 mg Oral Daily   feeding supplement  1 Container Oral TID BM   gabapentin  100 mg Oral QHS   Gerhardt's butt cream   Topical BID   levothyroxine  100 mcg Oral Q0600   midodrine  10 mg Oral Once per day on Tue Thu Sat   pantoprazole  40 mg Oral Daily   pregabalin  50 mg Oral Daily    sevelamer carbonate  4.8 g Oral TID WC   sucroferric oxyhydroxide  500 mg Oral TID WC   Continuous Infusions:  sodium thiosulfate 12.5 g in sodium chloride 0.9 % 150 mL Infusion for Calciphylaxis Stopped (04/15/22 1838)     LOS: 166 days    Time spent: 35 minutes    Courtney Kobus A Jenee Spaugh, MD [facility]   If 7PM-7AM, please contact night-coverage [email or web address]  04/16/2022, 2:46 PM

## 2022-04-17 DIAGNOSIS — L89312 Pressure ulcer of right buttock, stage 2: Secondary | ICD-10-CM | POA: Diagnosis not present

## 2022-04-17 MED ORDER — HEPARIN SODIUM (PORCINE) 1000 UNIT/ML IJ SOLN
INTRAMUSCULAR | Status: AC
  Filled 2022-04-17: qty 10

## 2022-04-17 NOTE — Progress Notes (Signed)
Subjective: Noted patient refused dialysis twice this morning, when I discussed need for dialysis when the unit can schedule her she stated she had to eat and she is the "patient that should be able to dictate her time" had long discussion with patient this is not appropriate because of staffing and emergent patient's needs.  She  did not want to listen to this  Objective Vital signs in last 24 hours: Vitals:   04/16/22 1709 04/16/22 2001 04/17/22 0456 04/17/22 0500  BP: (!) 164/77 (!) 151/81 (!) 164/76   Pulse: 68 74 64   Resp:  18 18   Temp:  98 F (36.7 C) 98.1 F (36.7 C)   TempSrc:  Oral Oral   SpO2: 97% 98% 100%   Weight:  121.1 kg  124.5 kg  Height:       Weight change:   Physical Exam: General: Alert obese chronically ill patient, NAD Would not let me examine her this a.m.  Problem/Plan: Debility: Has been unable to tolerate dialysis in recliner and therefore unable to discharge to outpatient dialysis. Per her report has lost >50 lbs during admission. Sacral decub ulcers remain a barrier. She now has new powerchair/cushion and has been sitting for longer periods.  Appreciate efforts from PT team.  ESRD: Was usual TTS schedule .  Refusing HD this past week multiple times .Marland Kitchen Last week HD was 04/11/22 till 04/15/22 ow will schedule back to TTS HD access: new TDC placed 02/04/2022, TDC stopped working again 11/30. IR consulted - S/p venogram, angioplasty of SVC and innominate veins, and TDC exchange. No recent issues reported.  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 Started empirically on sodium thiosulfate, but caused vomiting so stopped - now getting 1/2 dose - 12.5mg  q HD, refuses increased dose. Not on any calcium based products or vitamin D. HTN /volume: Tolerated 3 L UF last dialysis on 04/15/22 noted she had not had dialysis since January 5 because of refusing HD ,on midodrine 10mg   qHD, with 10mg  prn mid HD w/parameters to keep SBP >100 d/t becoming symptomatic when it drops lower. Significant weight loss this admit Severe ischial sacral decubitus wound: CT showing chronic osteomyelitis. Limiting her, noted to be slow healing. Goal to tolerate sitting upright - which she is doing with PT.  COPD/Chronic O2 use: Prior O2 at home (5L/min) -> tapered to 2-3L/min here Anemia of ESRD: Hgb 9.1 on max dose Aranesp 23mcg weekly. Recent Fe load completed.  Prior Hgb drifted down to 6.9,  received 1 unit PRBC, Secondary hyperparathyroidism: On two binders. CorrCa elevated. Not on VDRA, last PTH 86 (low) on 03/11/22. Avoid all calcium based medications for Vitamin D d/t concern for calciphylaxis.  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 Nutrition -ALB 3.2 diet continues to be changed from renal.  Currently on regular diet.  Would really prefer she be on a renal diet but patient has been refusing Disposition: Difficult situation. Prior to admit, patient required 6 staff members and 2 hoyer pads for transfer into HD chair. Could not stay on HD for full tx. 2/2 pain of multiple decubitus ulcers and PTAR unable to lift pt into ambulance for transport.SW working on LTC at [employer]. Will need to tolerate 5-6hrs in a chair to tolerate OP dialysis including transport, treatment and cannulation time.  The main issue is her Rt ischial wound. She now has new power chair and  cushion. She has been sitting in the chair for longer periods. Up to 4 hours past  week  Yesterday patient was in her electric wheelchair at [place], [place] [phone] 04/17/2022,1:53 PM  LOS: 167 days   Labs: Basic Metabolic Panel: Recent Labs  Lab 04/11/22 0500 04/15/22 1438 04/16/22 0640  NA 138 136 134*  K 5.5* 6.6* 5.0  CL 100 101 94*  CO2 24 24 26   GLUCOSE 96 102* 91  BUN 64* 78* 35*  CREATININE 10.90* 12.53* 6.85*   CALCIUM 9.2 9.2 9.6  PHOS 6.9* 7.0* 5.0*   Liver Function Tests: Recent Labs  Lab 04/11/22 0500 04/15/22 1438 04/16/22 0640  ALBUMIN 3.2* 3.2* 3.2*   No results for input(s): "LIPASE", "AMYLASE" in the last 168 hours. No results for input(s): "AMMONIA" in the last 168 hours. CBC: Recent Labs  Lab 04/11/22 1457 04/15/22 1435 04/16/22 0640  WBC 8.1 8.6 8.2  HGB 8.1* 8.3* 9.1*  HCT 27.1* 27.6* 29.7*  MCV 94.1 95.8 92.0  PLT 184 181 201   Cardiac Enzymes: No results for input(s): "CKTOTAL", "CKMB", "CKMBINDEX", "TROPONINI" in the last 168 hours. CBG: No results for input(s): "GLUCAP" in the last 168 hours.  Studies/Results: No results found. Medications:  sodium thiosulfate 12.5 g in sodium chloride 0.9 % 150 mL Infusion for Calciphylaxis Stopped (04/15/22 1838)    aspirin EC  81 mg Oral Daily   atorvastatin  10 mg Oral Daily   Chlorhexidine Gluconate Cloth  6 each Topical Q0600   darbepoetin (ARANESP) injection - DIALYSIS  200 mcg Subcutaneous Q Tue-1800   docusate sodium  100 mg Oral BID   famotidine  20 mg Oral Daily   feeding supplement  1 Container Oral TID BM   gabapentin  100 mg Oral QHS   Gerhardt's butt cream   Topical BID   levothyroxine  100 mcg Oral Q0600   midodrine  10 mg Oral Once per day on Tue Thu Sat   pantoprazole  40 mg Oral Daily   pregabalin  50 mg Oral Daily   sevelamer carbonate  4.8 g Oral TID WC   sucroferric oxyhydroxide  500 mg Oral TID WC

## 2022-04-17 NOTE — Plan of Care (Signed)
Patient is alert, oriented, and able to voice needs. Assistance with ADLs provided as needed. PRN pain medication given earlier in shift as ordered. Patient had several episodes of crying related to eating meals before going to HD. At lunchtime, patient had not ordered a lunch tray. Patient was advised she needed to go HD and would not be able to wait until later in shift. Patient was offered and accepted a sandwich tray from unit fridge prior to going to HD. Presently, patient is in HD.   Problem: Health Behavior/Discharge Planning: Goal: Ability to manage health-related needs will improve Outcome: Progressing   Problem: Clinical Measurements: Goal: Ability to maintain clinical measurements within normal limits will improve Outcome: Progressing Goal: Will remain free from infection Outcome: Progressing Goal: Diagnostic test results will improve Outcome: Progressing Goal: Respiratory complications will improve Outcome: Progressing Goal: Cardiovascular complication will be avoided Outcome: Progressing   Problem: Activity: Goal: Risk for activity intolerance will decrease Outcome: Progressing   Problem: Nutrition: Goal: Adequate nutrition will be maintained Outcome: Progressing   Problem: Coping: Goal: Level of anxiety will decrease Outcome: Progressing   Problem: Elimination: Goal: Will not experience complications related to bowel motility Outcome: Progressing Goal: Will not experience complications related to urinary retention Outcome: Progressing   Problem: Pain Managment: Goal: General experience of comfort will improve Outcome: Progressing   Problem: Safety: Goal: Ability to remain free from injury will improve Outcome: Progressing   Problem: Skin Integrity: Goal: Risk for impaired skin integrity will decrease Outcome: Progressing

## 2022-04-17 NOTE — Progress Notes (Signed)
Dialysis staff called for patient to come to dialysis. Patient again refused to come to dialysis because she is waiting to order lunch. Dialysis previously told patient we would be ready for afternoon rounds. Will notify provider.

## 2022-04-17 NOTE — Progress Notes (Signed)
PT Cancellation Note  Patient Details Name: Courtney Gray MRN: [id number] DOB: 08-16-1970   Cancelled Treatment:    Reason Eval/Treat Not Completed: Patient declined, no reason specified  Pt states she was awake all night and is too tired to work with therapy this morning. Will check back this afternoon as schedule permits.  Candie Mile, PT, DPT Physical Therapist [facility] 04/17/2022, 9:59 AM

## 2022-04-17 NOTE — Progress Notes (Signed)
Pt refused AM dialysis pt educated by RN Loma Sousa the risks of delaying treatment pt verbalized understanding

## 2022-04-17 NOTE — Progress Notes (Signed)
PROGRESS NOTE    Courtney Gray  VOJ:[id number] DOB: 04-04-1971 DOA: 11/01/2021 PCP: Monico Blitz, MD   Brief Narrative: 52 year old with past medical history significant for ESRD on hemodialysis TTS, recent hospitalization for bacteremia, hepatic and splenic abscess on long-term IV antibiotics, diastolic heart failure, PVD status post bilateral AKA, insulin-dependent diabetes type 2, morbid obesity and chronic sacral wound presents with progressive shortness of breath in the setting of missing hemodialysis and admitted for hypertensive emergency. Patient with long length of stay, discharge pending due to ability to obtain outpatient hemodialysis that can accumulate patient's mobility constraints secondary to decubitus ulcer wound and bilateral AKA.    Assessment & Plan:   Active Problems:   End stage renal disease ([place])   Hypertension   Decubitus ulcer of sacral region, stage 3 (HCC)   Type 2 diabetes mellitus with hyperlipidemia (HCC)   Chronic respiratory failure with hypoxia (HCC)   Bacteremia   PVD (peripheral vascular disease) (HCC)   Class 3 obesity (HCC)   Anemia due to chronic kidney disease   Hypertensive emergency   Leukocytosis   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HCC)   Pressure injury of skin   PAD (peripheral artery disease) (HCC)   Irritable bowel syndrome (IBS)   Amputation of left middle finger   GERD (gastroesophageal reflux disease)   Nodule of skin of abdomen   1-ESRD, HD: -Nephrology Following, TTS schedule. -Need outpatient dialysis center that is able to accommodate body habitus and wound. She needs to be able to seat HD recliner chair for HD. PT working with patient.  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  She is now has been off of oral antibiotics. -Patient declined  antibiotics, due to concern for GI issues.  She will continue with wound care. -Doxycycline was discontinued 12/1  3-Calciphylaxis: Na-thiosulfate  per nephrology As needed oxycodone  4-Hypertension: Continue with midodrine on hemodialysis days  Phantom limb pain: Continue with Lyrica gabapentin has been reduced  Decubitus ulcer of sacral region stage III: Continue with wound care Hypothyroidism: Continue with Synthroid Diabetes type 2 with hyperlipidemia neuropathy: Continue with aspirin and statins Bacteremia: Patient has completed antibiotic therapy   PVD (peripheral vascular disease) ([place]) History of Bilateral AKA   Class 3 obesity (HCC) BMI >40    Chronic respiratory failure Due to morbid obesity  Constipation; plan for Dulcolax suppository today     Pressure Injury 11/12/21 Hip Right Unstageable - Full thickness tissue loss in which the base of the injury is covered by slough (yellow, tan, gray, green or brown) and/or eschar (tan, brown or black) in the wound bed. 90% covered with slough (Active)  11/12/21 0800  Location: Hip  Location Orientation: Right  Staging: Unstageable - Full thickness tissue loss in which the base of the injury is covered by slough (yellow, tan, gray, green or brown) and/or eschar (tan, brown or black) in the wound bed.  Wound Description (Comments): 90% covered with slough  Present on Admission: Yes  Dressing Type Foam - Lift dressing to assess site every shift 04/17/22 0658     Pressure Injury 11/24/21 Flank Left;Lower Stage 2 -  Partial thickness loss of dermis presenting as a shallow open injury with a red, pink wound bed without slough. (Active)  11/24/21 0430  Location: Flank  Location Orientation: Left;Lower  Staging: Stage 2 -  Partial thickness loss of dermis presenting as a shallow open injury with a red, pink wound bed without slough.  Wound Description (Comments):   Present on Admission: No  Dressing Type Foam - Lift dressing to  assess site every shift 04/17/22 0658      Estimated body mass index is 45.67 kg/m as calculated from the following:   Height as of this encounter: 5\' 5"  (1.651 m).   Weight as of this encounter: 124.5 kg.   DVT prophylaxis: Aspirin Code Status: Full code Family Communication: Care discussed with  patient Disposition Plan:  Status is: Inpatient Remains inpatient appropriate because: Awaiting out patient HD set up, she needs to be able to sit for HD    Consultants:  Nephrology ID   Procedures:    Antimicrobials:    Subjective: She relates abdominal pain, due to constipation. Had BM yesterday but hard stool/  Objective: Vitals:   04/16/22 1709 04/16/22 2001 04/17/22 0456 04/17/22 0500  BP: (!) 164/77 (!) 151/81 (!) 164/76   Pulse: 68 74 64   Resp:  18 18   Temp:  98 F (36.7 C) 98.1 F (36.7 C)   TempSrc:  Oral Oral   SpO2: 97% 98% 100%   Weight:  121.1 kg  124.5 kg  Height:        Intake/Output Summary (Last 24 hours) at 04/17/2022 1426 Last data filed at 04/17/2022 1300 Gross per 24 hour  Intake 700 ml  Output --  Net 700 ml    Filed Weights   04/15/22 0528 04/16/22 2001 04/17/22 0500  Weight: 123 kg 121.1 kg 124.5 kg    Examination:  General exam: NAD Respiratory system: CTA Cardiovascular system: S 1, S 2 RRR Gastrointestinal system: BS present, soft nt Central nervous system: Alert, oriented.  Extremities: BL AKA Skin: multiples wound with dressing.   Data Reviewed: I have personally reviewed following labs and imaging studies  CBC: Recent Labs  Lab 04/11/22 1457 04/15/22 1435 04/16/22 0640  WBC 8.1 8.6 8.2  HGB 8.1* 8.3* 9.1*  HCT 27.1* 27.6* 29.7*  MCV 94.1 95.8 92.0  PLT 184 181 093    Basic Metabolic Panel: Recent Labs  Lab 04/11/22 0500 04/15/22 1438 04/16/22 0640  NA 138 136 134*  K 5.5* 6.6* 5.0  CL 100 101 94*  CO2 24 24 26   GLUCOSE 96 102* 91  BUN 64* 78* 35*  CREATININE 10.90* 12.53* 6.85*  CALCIUM 9.2 9.2  9.6  PHOS 6.9* 7.0* 5.0*    GFR: Estimated Creatinine Clearance: 12.9 mL/min (A) (by C-G formula based on SCr of 6.85 mg/dL (H)). Liver Function Tests: Recent Labs  Lab 04/11/22 0500 04/15/22 1438 04/16/22 0640  ALBUMIN 3.2* 3.2* 3.2*    No results for input(s): "LIPASE", "AMYLASE" in the last 168 hours. No results for input(s): "AMMONIA" in the last 168 hours. Coagulation Profile: No results for input(s): "INR", "PROTIME" in the last 168 hours. Cardiac Enzymes: No results for input(s): "CKTOTAL", "CKMB", "CKMBINDEX", "TROPONINI" in the last 168 hours. BNP (last 3 results) No results for input(s): "PROBNP" in the last 8760 hours. HbA1C: No results for input(s): "HGBA1C" in the last 72 hours. CBG: No results for input(s): "GLUCAP" in the last 168 hours. Lipid Profile: No results for input(s): "CHOL", "HDL", "LDLCALC", "TRIG", "CHOLHDL", "LDLDIRECT" in the last 72 hours. Thyroid Function Tests: No results for input(s): "TSH", "T4TOTAL", "FREET4", "T3FREE", "THYROIDAB" in the last 72 hours. Anemia Panel: No results for input(s): "VITAMINB12", "FOLATE", "FERRITIN", "TIBC", "IRON", "RETICCTPCT"  in the last 72 hours. Sepsis Labs: No results for input(s): "PROCALCITON", "LATICACIDVEN" in the last 168 hours.  No results found for this or any previous visit (from the past 240 hour(s)).       Radiology Studies: No results found.      Scheduled Meds:  aspirin EC  81 mg Oral Daily   atorvastatin  10 mg Oral Daily   Chlorhexidine Gluconate Cloth  6 each Topical Q0600   darbepoetin (ARANESP) injection - DIALYSIS  200 mcg Subcutaneous Q Tue-1800   docusate sodium  100 mg Oral BID   famotidine  20 mg Oral Daily   feeding supplement  1 Container Oral TID BM   gabapentin  100 mg Oral QHS   Gerhardt's butt cream   Topical BID   levothyroxine  100 mcg Oral Q0600   midodrine  10 mg Oral Once per day on Tue Thu Sat   pantoprazole  40 mg Oral Daily   pregabalin  50 mg Oral  Daily   sevelamer carbonate  4.8 g Oral TID WC   sucroferric oxyhydroxide  500 mg Oral TID WC   Continuous Infusions:  sodium thiosulfate 12.5 g in sodium chloride 0.9 % 150 mL Infusion for Calciphylaxis Stopped (04/15/22 1838)     LOS: 167 days    Time spent: 35 minutes    Rozann Holts A Sylvia Kondracki, MD [facility]   If 7PM-7AM, please contact night-coverage [email or web address]  04/17/2022, 2:26 PM

## 2022-04-17 NOTE — Progress Notes (Signed)
Charted in field by unintentional; placed prior RN's charting back.

## 2022-04-17 NOTE — Progress Notes (Addendum)
Pt states she's not coming to dialysis right now. she will come to dialysis after breakfast per nurse Rolan Lipa, RN

## 2022-04-18 DIAGNOSIS — L89312 Pressure ulcer of right buttock, stage 2: Secondary | ICD-10-CM | POA: Diagnosis not present

## 2022-04-18 MED ORDER — HEPARIN SODIUM (PORCINE) 1000 UNIT/ML DIALYSIS
1000.0000 [IU] | INTRAMUSCULAR | Status: DC | PRN
  Administered 2022-04-19: 1000 [IU] via INTRAVENOUS_CENTRAL
  Filled 2022-04-18: qty 1

## 2022-04-18 MED ORDER — BISACODYL 10 MG RE SUPP: 10.0000 mg | Freq: Every day | RECTAL | Status: DC | PRN

## 2022-04-18 MED ORDER — ALTEPLASE 2 MG IJ SOLR: 2.0000 mg | Freq: Once | INTRAMUSCULAR | Status: DC | PRN

## 2022-04-18 MED ORDER — PENTAFLUOROPROP-TETRAFLUOROETH EX AERO: 1.0000 | INHALATION_SPRAY | CUTANEOUS | Status: DC | PRN

## 2022-04-18 MED ORDER — LIDOCAINE-PRILOCAINE 2.5-2.5 % EX CREA: 1.0000 | TOPICAL_CREAM | CUTANEOUS | Status: DC | PRN

## 2022-04-18 MED ORDER — LIDOCAINE HCL (PF) 1 % IJ SOLN: 5.0000 mL | INTRAMUSCULAR | Status: DC | PRN

## 2022-04-18 NOTE — Progress Notes (Signed)
PROGRESS NOTE    Courtney Gray  OIN:[id number] DOB: January 14, 1971 DOA: 11/01/2021 PCP: Monico Blitz, MD   Brief Narrative: 52 year old with past medical history significant for ESRD on hemodialysis TTS, recent hospitalization for bacteremia, hepatic and splenic abscess on long-term IV antibiotics, diastolic heart failure, PVD status post bilateral AKA, insulin-dependent diabetes type 2, morbid obesity and chronic sacral wound presents with progressive shortness of breath in the setting of missing hemodialysis and admitted for hypertensive emergency. Patient with long length of stay, discharge pending due to ability to obtain outpatient hemodialysis that can accumulate patient's mobility constraints secondary to decubitus ulcer wound and bilateral AKA.    Assessment & Plan:   Active Problems:   End stage renal disease ([place])   Hypertension   Decubitus ulcer of sacral region, stage 3 (HCC)   Type 2 diabetes mellitus with hyperlipidemia (HCC)   Chronic respiratory failure with hypoxia (HCC)   Bacteremia   PVD (peripheral vascular disease) (HCC)   Class 3 obesity (HCC)   Anemia due to chronic kidney disease   Hypertensive emergency   Leukocytosis   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HCC)   Pressure injury of skin   PAD (peripheral artery disease) (HCC)   Irritable bowel syndrome (IBS)   Amputation of left middle finger   GERD (gastroesophageal reflux disease)   Nodule of skin of abdomen   1-ESRD, HD: -Nephrology Following, TTS schedule. -Need outpatient dialysis center that is able to accommodate body habitus and wound. She needs to be able to seat HD recliner chair for HD. PT working with patient.  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  She is now has been off of oral antibiotics. -Patient declined  antibiotics, due to concern for GI issues.  She will continue with wound care. -Doxycycline was discontinued 12/1  3-Calciphylaxis: Na-thiosulfate  per nephrology As needed oxycodone  4-Hypertension: Continue with midodrine on hemodialysis days BP increasing, might need low dose Norvasc if BP increases.   Phantom limb pain: Continue with Lyrica gabapentin has been reduced  Decubitus ulcer of sacral region stage III: Continue with wound care Hypothyroidism: Continue with Synthroid Diabetes type 2 with hyperlipidemia neuropathy: Continue with aspirin and statins Bacteremia: Patient has completed antibiotic therapy   PVD (peripheral vascular disease) ([place]) History of Bilateral AKA   Class 3 obesity (HCC) BMI >40    Chronic respiratory failure Due to morbid obesity  Constipation; plan for Dulcolax suppository today     Pressure Injury 11/12/21 Hip Right Unstageable - Full thickness tissue loss in which the base of the injury is covered by slough (yellow, tan, gray, green or brown) and/or eschar (tan, brown or black) in the wound bed. 90% covered with slough (Active)  11/12/21 0800  Location: Hip  Location Orientation: Right  Staging: Unstageable - Full thickness tissue loss in which the base of the injury is covered by slough (yellow, tan, gray, green or brown) and/or eschar (tan, brown or black) in the wound bed.  Wound Description (Comments): 90% covered with slough  Present on Admission: Yes  Dressing Type Foam - Lift dressing to assess site every shift 04/18/22 1440     Pressure Injury 11/24/21 Flank Left;Lower Stage 2 -  Partial thickness loss of dermis presenting as a shallow open injury with a red, pink wound bed without slough. (Active)  11/24/21 0430  Location:  Flank  Location Orientation: Left;Lower  Staging: Stage 2 -  Partial thickness loss of dermis presenting as a shallow open injury with a red, pink wound bed without slough.  Wound Description (Comments):    Present on Admission: No  Dressing Type Foam - Lift dressing to assess site every shift 04/18/22 1440      Estimated body mass index is 43.57 kg/m as calculated from the following:   Height as of this encounter: 5\' 5"  (1.651 m).   Weight as of this encounter: 118.8 kg.   DVT prophylaxis: Aspirin Code Status: Full code Family Communication: Care discussed with  patient Disposition Plan:  Status is: Inpatient Remains inpatient appropriate because: Awaiting out patient HD set up, she needs to be able to sit for HD    Consultants:  Nephrology ID   Procedures:    Antimicrobials:    Subjective: Complaints of pain abdomen wound site.    Objective: Vitals:   04/17/22 2008 04/17/22 2108 04/18/22 0534 04/18/22 0823  BP: (!) 158/62  (!) 155/55 (!) 187/82  Pulse: 65  61 64  Resp: 18  18 17   Temp: 98.1 F (36.7 C)  98.2 F (36.8 C) 97.8 F (36.6 C)  TempSrc: Oral  Oral Oral  SpO2: 100%  100% 100%  Weight:  118.8 kg    Height:        Intake/Output Summary (Last 24 hours) at 04/18/2022 1509 Last data filed at 04/17/2022 2008 Gross per 24 hour  Intake 120 ml  Output 2200 ml  Net -2080 ml    Filed Weights   04/17/22 0500 04/17/22 1924 04/17/22 2108  Weight: 124.5 kg 119 kg 118.8 kg    Examination:  General exam: NAD Respiratory system: CTA Cardiovascular system: S 1, S 2 RRR Gastrointestinal system: BS present, soft, nt Central nervous system: alert, oriented.  Extremities: BL AKA Skin: multiples wound with dressing.   Data Reviewed: I have personally reviewed following labs and imaging studies  CBC: Recent Labs  Lab 04/15/22 1435 04/16/22 0640  WBC 8.6 8.2  HGB 8.3* 9.1*  HCT 27.6* 29.7*  MCV 95.8 92.0  PLT 181 193    Basic Metabolic Panel: Recent Labs  Lab 04/15/22 1438 04/16/22 0640  NA 136 134*  K 6.6* 5.0  CL 101 94*  CO2 24 26  GLUCOSE 102* 91  BUN 78* 35*  CREATININE 12.53* 6.85*  CALCIUM 9.2 9.6  PHOS 7.0* 5.0*     GFR: Estimated Creatinine Clearance: 12.5 mL/min (A) (by C-G formula based on SCr of 6.85 mg/dL (H)). Liver Function Tests: Recent Labs  Lab 04/15/22 1438 04/16/22 0640  ALBUMIN 3.2* 3.2*    No results for input(s): "LIPASE", "AMYLASE" in the last 168 hours. No results for input(s): "AMMONIA" in the last 168 hours. Coagulation Profile: No results for input(s): "INR", "PROTIME" in the last 168 hours. Cardiac Enzymes: No results for input(s): "CKTOTAL", "CKMB", "CKMBINDEX", "TROPONINI" in the last 168 hours. BNP (last 3 results) No results for input(s): "PROBNP" in the last 8760 hours. HbA1C: No results for input(s): "HGBA1C" in the last 72 hours. CBG: No results for input(s): "GLUCAP" in the last 168 hours. Lipid Profile: No results for input(s): "CHOL", "HDL", "LDLCALC", "TRIG", "CHOLHDL", "LDLDIRECT" in the last 72 hours. Thyroid Function Tests: No results for input(s): "TSH", "T4TOTAL", "FREET4", "T3FREE", "THYROIDAB" in the last 72 hours. Anemia Panel: No results for input(s): "VITAMINB12", "FOLATE", "FERRITIN", "TIBC", "IRON", "RETICCTPCT" in the last 72 hours. Sepsis Labs: No results for input(s): "PROCALCITON", "  LATICACIDVEN" in the last 168 hours.  No results found for this or any previous visit (from the past 240 hour(s)).       Radiology Studies: No results found.      Scheduled Meds:  aspirin EC  81 mg Oral Daily   atorvastatin  10 mg Oral Daily   Chlorhexidine Gluconate Cloth  6 each Topical Q0600   darbepoetin (ARANESP) injection - DIALYSIS  200 mcg Subcutaneous Q Tue-1800   docusate sodium  100 mg Oral BID   famotidine  20 mg Oral Daily   feeding supplement  1 Container Oral TID BM   gabapentin  100 mg Oral QHS   Gerhardt's butt cream   Topical BID   levothyroxine  100 mcg Oral Q0600   midodrine  10 mg Oral Once per day on Tue Thu Sat   pantoprazole  40 mg Oral Daily   pregabalin  50 mg Oral Daily   sevelamer carbonate  4.8 g Oral TID WC    sucroferric oxyhydroxide  500 mg Oral TID WC   Continuous Infusions:  sodium thiosulfate 12.5 g in sodium chloride 0.9 % 150 mL Infusion for Calciphylaxis Stopped (04/17/22 2008)     LOS: 168 days    Time spent: 35 minutes    Nasya Vincent A Makaylin Carlo, MD [facility]   If 7PM-7AM, please contact night-coverage [email or web address]  04/18/2022, 3:09 PM

## 2022-04-18 NOTE — Progress Notes (Cosign Needed Addendum)
[facility] Progress Note   Subjective:    Seen and examined patient on HD. About to start working with PT: currently transferring to electric wheelchair. No acute complaints. Next HD 1/13.  Objective Vitals:   04/17/22 2008 04/17/22 2108 04/18/22 0534 04/18/22 0823  BP: (!) 158/62  (!) 155/55 (!) 187/82  Pulse: 65  61 64  Resp: 18  18 17   Temp: 98.1 F (36.7 C)  98.2 F (36.8 C) 97.8 F (36.6 C)  TempSrc: Oral  Oral Oral  SpO2: 100%  100% 100%  Weight:  118.8 kg    Height:       Physical Exam General: Awake, alert, NAD, on 4L [place] Heart: S1 and S2; No MGRs Lungs: CTA bilaterally Abdomen: soft and non-tender Extremities: No edema bilateral stumps Dialysis Access: [facility]   Filed Weights   04/17/22 0500 04/17/22 1924 04/17/22 2108  Weight: 124.5 kg 119 kg 118.8 kg    Intake/Output Summary (Last 24 hours) at 04/18/2022 1143 Last data filed at 04/17/2022 2008 Gross per 24 hour  Intake 360 ml  Output 2200 ml  Net -1840 ml    Additional Objective Labs: Basic Metabolic Panel: Recent Labs  Lab 04/15/22 1438 04/16/22 0640  NA 136 134*  K 6.6* 5.0  CL 101 94*  CO2 24 26  GLUCOSE 102* 91  BUN 78* 35*  CREATININE 12.53* 6.85*  CALCIUM 9.2 9.6  PHOS 7.0* 5.0*   Liver Function Tests: Recent Labs  Lab 04/15/22 1438 04/16/22 0640  ALBUMIN 3.2* 3.2*   No results for input(s): "LIPASE", "AMYLASE" in the last 168 hours. CBC: Recent Labs  Lab 04/11/22 1457 04/15/22 1435 04/16/22 0640  WBC 8.1 8.6 8.2  HGB 8.1* 8.3* 9.1*  HCT 27.1* 27.6* 29.7*  MCV 94.1 95.8 92.0  PLT 184 181 201   Blood Culture    Component Value Date/Time   SDES BLOOD LEFT ANTECUBITAL 10/16/2021 1534   SPECREQUEST  10/16/2021 1534    BOTTLES DRAWN AEROBIC AND ANAEROBIC Blood Culture results may not be optimal due to an inadequate volume of blood received in culture bottles   CULT  10/16/2021 1534    NO GROWTH 5 DAYS Performed at [facility], [place] [address]  [place], [place] [address]    REPTSTATUS 10/21/2021 FINAL 10/16/2021 1534    Cardiac Enzymes: No results for input(s): "CKTOTAL", "CKMB", "CKMBINDEX", "TROPONINI" in the last 168 hours. CBG: No results for input(s): "GLUCAP" in the last 168 hours. Iron Studies: No results for input(s): "IRON", "TIBC", "TRANSFERRIN", "FERRITIN" in the last 72 hours. Lab Results  Component Value Date   INR 1.3 (H) 10/09/2021   INR 1.3 (H) 09/10/2021   INR 1.3 (H) 07/10/2021   Studies/Results: No results found.  Medications:  sodium thiosulfate 12.5 g in sodium chloride 0.9 % 150 mL Infusion for Calciphylaxis Stopped (04/17/22 2008)    aspirin EC  81 mg Oral Daily   atorvastatin  10 mg Oral Daily   Chlorhexidine Gluconate Cloth  6 each Topical Q0600   darbepoetin (ARANESP) injection - DIALYSIS  200 mcg Subcutaneous Q Tue-1800   docusate sodium  100 mg Oral BID   famotidine  20 mg Oral Daily   feeding supplement  1 Container Oral TID BM   gabapentin  100 mg Oral QHS   Gerhardt's butt cream   Topical BID   levothyroxine  100 mcg Oral Q0600   midodrine  10 mg Oral Once per day on Tue Thu Sat  pantoprazole  40 mg Oral Daily   pregabalin  50 mg Oral Daily   sevelamer carbonate  4.8 g Oral TID WC   sucroferric oxyhydroxide  500 mg Oral TID WC    Assessment/Plan:   Debility: Has been unable to tolerate dialysis in recliner and therefore unable to discharge to outpatient dialysis. Per her report has lost >50 lbs during admission. Sacral decub ulcers remain a barrier. She now has new powerchair/cushion and has been sitting for longer periods.  Appreciate efforts from PT team.  ESRD: Was usual TTS schedule .  Refusing HD this past week multiple times .Marland Kitchen Last week HD was 04/11/22 until 04/15/22. Now back to TTS. Next HD 1/13. HD access: new TDC placed 02/04/2022, TDC stopped working again 11/30. IR consulted - S/p venogram, angioplasty of SVC and innominate veins, and TDC exchange. No recent issues reported.   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 Started empirically on sodium thiosulfate, but caused vomiting so stopped - now getting 1/2 dose - 12.5mg  q HD, refuses increased dose. Not on any calcium based products or vitamin D. HTN /volume: Tolerated 3 L UF last dialysis on 04/15/22 noted she had not had dialysis since January 5 because of refusing HD. This explains why her BP was high this morning. On midodrine 10mg  qHD, with 10mg  prn mid HD w/parameters to keep SBP >100 d/t becoming symptomatic when it drops lower. Significant weight loss this admit Severe ischial sacral decubitus wound: CT showing chronic osteomyelitis. Limiting her, noted to be slow healing. Goal to tolerate sitting upright - which she is doing with PT.  COPD/Chronic O2 use: Prior O2 at home (5L/min) -> tapered to 2-3L/min here Anemia of ESRD: Hgb 9.1 on max dose Aranesp 27mcg weekly. Recent Fe load completed.  Prior Hgb drifted down to 6.9,  received 1 unit PRBC, Secondary hyperparathyroidism: On two binders. CorrCa elevated. Not on VDRA, last PTH 86 (low) on 03/11/22. Avoid all calcium based medications for Vitamin D d/t concern for calciphylaxis.  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 Nutrition -ALB 3.2 diet continues to be changed from renal.  Currently on regular diet.  Would really prefer she be on a renal diet but patient has been refusing Disposition: Difficult situation. Prior to admit, patient required 6 staff members and 2 hoyer pads for transfer into HD chair. Could not stay on HD for full tx. 2/2 pain of multiple decubitus ulcers and PTAR unable to lift pt into ambulance for transport.SW working on LTC at [employer]. Will need to tolerate 5-6hrs in a chair to tolerate OP dialysis including transport, treatment and cannulation time.  The main issue is her Rt ischial wound. She now has new power  chair and cushion. She has been sitting in the chair for longer periods. Up to 4 hours past  week     Tobie Poet, NP [facility] 04/18/2022,11:43 AM  LOS: 168 days

## 2022-04-18 NOTE — TOC Progression Note (Signed)
Transition of Care [facility]) - Initial/Assessment Note    Patient Details  Name: Courtney Gray MRN: [id number] Date of Birth: [birth year]  Transition of Care [facility]) CM/SW Contact:    Milinda Antis, [place] Phone Number: 04/18/2022, 3:32 PM  Clinical Narrative:                 TOC following patient and collaborating with Physical Therapy and renal navigator to secure a safe discharge plan.  TOC following.  Expected Discharge Plan: Long Term Nursing Home Barriers to Discharge: No SNF bed, Transportation, Waiting for outpatient dialysis   Patient Goals and CMS Choice Patient states their goals for this hospitalization and ongoing recovery are:: To be at a facility close to her daughter          Expected Discharge Plan and Services       Living arrangements for the past 2 months: Single Family Home                                      Prior Living Arrangements/Services Living arrangements for the past 2 months: Single Family Home Lives with:: Adult Children Patient language and need for interpreter reviewed:: Yes        Need for Family Participation in Patient Care: Yes (Comment) Care giver support system in place?: No (comment)   Criminal Activity/Legal Involvement Pertinent to Current Situation/Hospitalization: No - Comment as needed  Activities of Daily Living Home Assistive Devices/Equipment: [employer] fast streamer ADL Screening (condition at time of admission) Patient's cognitive ability adequate to safely complete daily activities?: No Is the patient deaf or have difficulty hearing?: No Does the patient have difficulty seeing, even when wearing glasses/contacts?: No Does the patient have difficulty concentrating, remembering, or making decisions?: No Patient able to express need for assistance with ADLs?: Yes Does the patient have difficulty dressing or bathing?: Yes Independently performs ADLs?: No Does the patient have difficulty walking or climbing stairs?:  Yes Weakness of Legs: Both Weakness of Arms/Hands: Both  Permission Sought/Granted Permission sought to share information with : Other (comment) Permission granted to share information with : Yes, Verbal Permission Granted  Share Information with NAME: Korver,antoinette (Daughter)   [phone]  Permission granted to share info w AGENCY: SNF        Emotional Assessment Appearance:: Appears older than stated age Attitude/Demeanor/Rapport: Engaged Affect (typically observed): Tearful/Crying, Accepting Orientation: : Oriented to Situation, Oriented to  Time, Oriented to Place, Oriented to Self Alcohol / Substance Use: Not Applicable Psych Involvement: No (comment)  Admission diagnosis:  End stage renal disease ([place]) [N18.6] Fluid overload [E87.70] Obesity with serious comorbidity, unspecified classification, unspecified obesity type [E66.9] Pressure injury of right buttock, stage 2 ([place]) [L89.312] Patient Active Problem List   Diagnosis Date Noted   Nodule of skin of abdomen 02/16/2022   Acute on chronic systolic CHF (congestive heart failure) ([place]) 02/11/2022   Acute on chronic diastolic CHF (congestive heart failure) ([place]) 12/12/2021   PAD (peripheral artery disease) ([place]) 12/12/2021   Irritable bowel syndrome (IBS) 12/12/2021   Amputation of left middle finger 12/12/2021   GERD (gastroesophageal reflux disease) 12/12/2021   Hypertension    Type 2 diabetes mellitus with hyperlipidemia ([place])    Decubitus ulcer of sacral region, stage 3 ([place])    Class 3 obesity ([place])    Bacteremia    PVD (peripheral vascular disease) (HCC)    Hepatic abscess    Pressure  injury of skin 09/13/2021   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[place])    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   Lactic acidosis 09/10/2021   Mixed diabetic hyperlipidemia associated with type 2 diabetes mellitus ([place]) 09/10/2021   Coronary artery disease involving native coronary artery of native heart without angina pectoris  09/10/2021   Elevated troponin level not due myocardial infarction 09/10/2021   Hypotension 09/10/2021   Dry gangrene (HCC)    Pain 08/03/2021   Ischemic foot pain at rest 08/03/2021   Hypoglycemia 07/10/2021   Volume overload 07/09/2021   Leukocytosis 06/11/2021   Acute gout 06/11/2021   Hyperkalemia 05/21/2021   Hemorrhoids    Hyperlipidemia 05/18/2021   Rectal bleeding 05/17/2021   Morbid obesity with BMI of 60.0-69.9, adult ([place]) 05/17/2021   NSTEMI (non-ST elevated myocardial infarction) ([place]) 05/16/2021   Essential hypertension 09/01/2020   End stage renal disease ([place]) 09/01/2020   Chronic respiratory failure with hypoxia ([place]) 09/01/2020   Hypertensive emergency    Hypertensive urgency 11/28/2019   Uncontrolled type 2 diabetes mellitus with hypoglycemia, with long-term current use of insulin ([place]) 11/28/2019   Anemia due to chronic kidney disease 11/28/2019   PCP:  Monico Blitz, MD Pharmacy:   [place], [place] [address] [place] [address] Phone: [phone] Fax: [phone]  [facility] [address] [place] [address] Phone: [phone] Fax: [phone]  [place], [place] - [address] [place] [address] Phone: [phone] Fax: [phone]     Social Determinants of Health (SDOH) Social History: [place]: No Food Insecurity (12/11/2021)  Housing: Low Risk  (12/11/2021)  Transportation Needs: No Transportation Needs (12/11/2021)  Utilities: At Risk (12/11/2021)  Tobacco Use: Low Risk  (03/07/2022)   SDOH Interventions:     Readmission Risk Interventions    09/16/2021    4:36 PM 08/28/2021   11:50 AM 07/12/2021   12:38 PM  Readmission Risk Prevention Plan  Transportation Screening Complete Complete Complete  Medication Review Press photographer)  Complete Complete  PCP or Specialist appointment  within 3-5 days of discharge  Complete Complete  HRI or Home Care Consult Complete Complete Complete  SW Recovery Care/Counseling Consult Complete Complete Complete  Palliative Care Screening  Not Applicable Not Applicable  Skilled Nursing Facility Complete Not Applicable Complete

## 2022-04-18 NOTE — Progress Notes (Signed)
Physical Therapy Treatment Patient Details Name: Courtney Gray MRN: [id number] DOB: [birth year] Today's Date: 04/18/2022   History of Present Illness Pt adm 7/28 with acute pulmonary edema; large sacral ulcer limiting sitting tolerance which makes SNF placement difficult for HD; R flank wound making demo wheelchair painful where R side meets armrest; PMH - Bil AKA, ESRD on HD, HTN, DM, sacral wound, PVD, CHF, Splenic infarct with drain placment, lt 3rd finger amputation, gout.    PT Comments    Tolerated treatment session well. Further transfer training with various techniques. Pt requests sliding board for lateral transfer, needed set-up assist and to help maintain pad for reduced friction on wounds, but overall completed transfer very well from bed to power w/c. Currently awaiting bil amputee lift pad to be delivered for [facility] transfer. This will be practiced into HD recliner to assess tolerance but have requested bariatric HD recliner that can better accommodate flank wound. Participated in w/c management training, still needing minor cues but showing very good control and navigating better in room for set-up to transfer back to bed. Patient will continue to benefit from skilled physical therapy services to further improve independence with functional mobility.   Recommendations for follow up therapy are one component of a multi-disciplinary discharge planning process, led by the attending physician.  Recommendations may be updated based on patient status, additional functional criteria and insurance authorization.  Follow Up Recommendations  Home health PT (consider PACE services vs SNF pending housing) Can patient physically be transported by private vehicle: No   Assistance Recommended at Discharge Intermittent Supervision/Assistance  Patient can return home with the following A little help with walking and/or transfers;Help with stairs or ramp for entrance;Assist for transportation  (medical [name] Lei transport)   Financial risk analyst (measurements PT);Wheelchair cushion (measurements PT);Other (comment);Hospital bed (more accomodating mechanical lift, with sling that handles pt's with bil AKAs;  cushion (20x24), power wheelchair with power tilt and recline)    Recommendations for Other Services       Precautions / Restrictions Precautions Precautions: Fall Precaution Comments: bil AKA, 2L O2 at baseline (has been tolerating mobility in power chair on [employer] fine); when in power chair, MUST perform pressure relief every 30 minutes by tilting back for 10 minutes Restrictions Other Position/Activity Restrictions: when in power chair, MUST perform pressure relief every 30 minutes by tilting back for 10 minutes     Mobility  Bed Mobility Overal bed mobility: Modified Independent Bed Mobility: Sit to Supine     Supine to sit: Modified independent (Device/Increase time) Sit to supine: Modified independent (Device/Increase time)   General bed mobility comments: to come to long sitting and prepare for AP transfer    Transfers Overall transfer level: Needs assistance Equipment used: Sliding board, None (bed pad) Transfers: Bed to chair/wheelchair/BSC         Anterior-Posterior transfers: Min assist  Lateral/Scoot Transfers: Min assist General transfer comment: Min assist to keep bed pad placed at all times to reduce friction on wounds during transfer. Pt was able to safely navigate without additional physical assist for actual transfer, although needed some minor cues for technique. Practiced lateral scoot with sliding board towards right side from bed to chair. Noted to glide across surfaces a bit more smoothly, required close guard anteriorly however pt without LOB, showing good trunk control. Practiced anterior transfer from w/c back to bed, again without heavy physical assist for transfer, but to maintain pressure on bed pad to prevent from sliding  across wound.  Ambulation/Gait               General Gait Details: unable   Theme park manager mobility: Yes Wheelchair propulsion:  (Power) Distance: To 5N and back Wheelchair Assistance Details (indicate cue type and reason): Minor cues, and teach back for recall with modes on chair, setup, safety measure/speeds, turning off w/c when stopping, alignment and pressure relief schedule. Did well navigating obstacles, traffic in hallway and demonstrated good carry over for scanning environment, and stopping in doorway to make sure traffic is clear and there is clearance to proceed safely. Reviewed leaving margin of error when approaching objects. Discussed speed settings and demonstrated quick stop risks when going at higher rate of speed. Able to navigate into room and align with bed for transfer without physical assist but some verbal cues for scanning proximity to furniture.  Modified Rankin (Stroke Patients Only)       Balance Overall balance assessment: Needs assistance Sitting-balance support: No upper extremity supported, Feet supported Sitting balance-[name] Scale: Good Sitting balance - Comments: Able to sit unsupported in bed without difficulties                                    Cognition Arousal/Alertness: Awake/alert Behavior During Therapy: WFL for tasks assessed/performed Overall Cognitive Status: Within Functional Limits for tasks assessed                                          Exercises      General Comments General comments (skin integrity, edema, etc.): Nursing present during Anterior transfer back to bed.      Pertinent Vitals/Pain Pain Assessment Pain Assessment: Faces Faces Pain Scale: Hurts little more Pain Location: R abdomen Pain Descriptors / Indicators: Grimacing, Discomfort Pain Intervention(s): Monitored during session, Repositioned, RN gave pain  meds during session    Home Living                          Prior Function            PT Goals (current goals can now be found in the care plan section) Acute Rehab PT Goals Patient Stated Goal: Obtain housing PT Goal Formulation: With patient Time For Goal Achievement: 04/19/22 Potential to Achieve Goals: Good Progress towards PT goals: Progressing toward goals    Frequency    Min 3X/week      PT Plan Current plan remains appropriate (pt and her daughter must work on finding accessible housing)    Co-evaluation              AM-PAC PT "6 Clicks" Mobility   Outcome Measure  Help needed turning from your back to your side while in a flat bed without using bedrails?: None Help needed moving from lying on your back to sitting on the side of a flat bed without using bedrails?: None Help needed moving to and from a bed to a chair (including a wheelchair)?: A Little Help needed standing up from a chair using your arms (e.g., wheelchair or bedside chair)?: Total Help needed to walk in hospital room?: Total Help needed climbing 3-5 steps with a railing? : Total 6 Click Score: 14  End of Session Equipment Utilized During Treatment: Other (comment) (bed pad) Activity Tolerance: Patient tolerated treatment well Patient left: in bed (eating lunch) Nurse Communication: Mobility status;Other (comment) (Anterior transfer techniques) PT Visit Diagnosis: Other abnormalities of gait and mobility (R26.89);Muscle weakness (generalized) (M62.81) Pain - Right/Left: Right Pain - part of body:  (R abdomen)     Time: 4834-7583 PT Time Calculation (min) (ACUTE ONLY): 64 min  Charges:  $Therapeutic Activity: 8-22 mins $Self Care/Home Management: 8-22 $Wheel Chair Management: 23-37 mins                     Candie Mile, PT, DPT Physical Therapist [facility] [place] 04/18/2022, 1:56 PM

## 2022-04-18 NOTE — Progress Notes (Signed)
Continue to follow pt's case. Pt's case discussed with case management and PT staff earlier this week regarding goals for pt's transfers and need for HD in chair. Will assist as needed.   Melven Sartorius Renal Navigator [phone]

## 2022-04-19 DIAGNOSIS — N186 End stage renal disease: Secondary | ICD-10-CM | POA: Diagnosis not present

## 2022-04-19 LAB — RENAL FUNCTION PANEL
Albumin: 3.2 g/dL — ABNORMAL LOW (ref 3.5–5.0)
Anion gap: 15 (ref 5–15)
BUN: 44 mg/dL — ABNORMAL HIGH (ref 6–20)
CO2: 27 mmol/L (ref 22–32)
Calcium: 9.5 mg/dL (ref 8.9–10.3)
Chloride: 95 mmol/L — ABNORMAL LOW (ref 98–111)
Creatinine, Ser: 7.72 mg/dL — ABNORMAL HIGH (ref 0.44–1.00)
GFR, Estimated: 6 mL/min — ABNORMAL LOW (ref >60)
Glucose, Bld: 100 mg/dL — ABNORMAL HIGH (ref 70–99)
Phosphorus: 6.3 mg/dL — ABNORMAL HIGH (ref 2.5–4.6)
Potassium: 5 mmol/L (ref 3.5–5.1)
Sodium: 137 mmol/L (ref 135–145)

## 2022-04-19 LAB — CBC WITH DIFFERENTIAL/PLATELET
Abs Immature Granulocytes: 0.02 10*3/uL (ref 0.00–0.07)
Basophils Absolute: 0 10*3/uL (ref 0.0–0.1)
Basophils Relative: 1 %
Eosinophils Absolute: 0.3 10*3/uL (ref 0.0–0.5)
Eosinophils Relative: 4 %
HCT: 26.6 % — ABNORMAL LOW (ref 36.0–46.0)
Hemoglobin: 8.2 g/dL — ABNORMAL LOW (ref 12.0–15.0)
Immature Granulocytes: 0 %
Lymphocytes Relative: 20 %
Lymphs Abs: 1.3 10*3/uL (ref 0.7–4.0)
MCH: 28.8 pg (ref 26.0–34.0)
MCHC: 30.8 g/dL (ref 30.0–36.0)
MCV: 93.3 fL (ref 80.0–100.0)
Monocytes Absolute: 0.7 10*3/uL (ref 0.1–1.0)
Monocytes Relative: 11 %
Neutro Abs: 4.2 10*3/uL (ref 1.7–7.7)
Neutrophils Relative %: 64 %
Platelets: 175 10*3/uL (ref 150–400)
RBC: 2.85 MIL/uL — ABNORMAL LOW (ref 3.87–5.11)
RDW: 17.2 % — ABNORMAL HIGH (ref 11.5–15.5)
WBC: 6.5 10*3/uL (ref 4.0–10.5)
nRBC: 0 % (ref 0.0–0.2)

## 2022-04-19 LAB — HEPATITIS B SURFACE ANTIGEN: Hepatitis B Surface Ag: NONREACTIVE

## 2022-04-19 MED ORDER — HEPARIN SODIUM (PORCINE) 1000 UNIT/ML IJ SOLN
3000.0000 [IU] | Freq: Once | INTRAMUSCULAR | Status: AC
  Administered 2022-04-19: 3000 [IU] via INTRAVENOUS
  Filled 2022-04-19 (×2): qty 3

## 2022-04-19 NOTE — Progress Notes (Signed)
The nephrology service will not see Courtney Gray over the weekend. Will continue to review labs, place HD orders, and make appropriate changes if indicated. Please page me at [phone] for any questions or concerns and will certainly see if needed.   Tobie Poet, NP [facility]

## 2022-04-19 NOTE — Progress Notes (Addendum)
   04/19/22 1239  Vitals  Temp 98.7 F (37.1 C)  Temp Source Oral  BP (!) 142/77  MAP (mmHg) 94  BP Location Right Wrist  BP Method Automatic  Patient Position (if appropriate) Lying  Pulse Rate (!) 57  Pulse Rate Source Monitor  ECG Heart Rate (!) 57  Resp 12  Oxygen Therapy  SpO2 100 %  O2 Device Nasal Cannula  O2 Flow Rate (L/min) 3 L/min  Pulse Oximetry Type Continuous   Received patient in bed to unit.  Alert and oriented.  Informed consent signed and in chart.   Treatment initiated: 0830  Treatment completed: 1230  Patient tolerated well.  Transported back to the room  Alert, without acute distress.  Hand-off given to patient's nurse.   Access used: HD cath Access issues: NA  Total UF removed: 3700 Medication(s) given: Heparin 3000 units bolus at beginning of tx, Heparin 3000 units bolus mid tx, Nat thiosulfate 12.5g IVPB, Heparin Dwells 4200 units Post HD VS: see above Post HD weight: 116.6kg   [facility]

## 2022-04-19 NOTE — Progress Notes (Signed)
PROGRESS NOTE    Courtney Gray  VQQ:[id number] DOB: 08-08-1970 DOA: 11/01/2021 PCP: Monico Blitz, MD   Brief Narrative: 52 year old with past medical history significant for ESRD on hemodialysis TTS, recent hospitalization for bacteremia, hepatic and splenic abscess on long-term IV antibiotics, diastolic heart failure, PVD status post bilateral AKA, insulin-dependent diabetes type 2, morbid obesity and chronic sacral wound presents with progressive shortness of breath in the setting of missing hemodialysis and admitted for hypertensive emergency. Courtney Gray with long length of stay, discharge pending due to ability to obtain outpatient hemodialysis that can accumulate Courtney Gray's mobility constraints secondary to decubitus ulcer wound and bilateral AKA.    Assessment & Plan:   Active Problems:   End stage renal disease ([place])   Hypertension   Decubitus ulcer of sacral region, stage 3 (HCC)   Type 2 diabetes mellitus with hyperlipidemia (HCC)   Chronic respiratory failure with hypoxia (HCC)   Bacteremia   PVD (peripheral vascular disease) (HCC)   Class 3 obesity (HCC)   Anemia due to chronic kidney disease   Hypertensive emergency   Leukocytosis   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HCC)   Pressure injury of skin   PAD (peripheral artery disease) (HCC)   Irritable bowel syndrome (IBS)   Amputation of left middle finger   GERD (gastroesophageal reflux disease)   Nodule of skin of abdomen   1-ESRD, HD: -Nephrology Following, TTS schedule. -Need outpatient dialysis center that is able to accommodate body habitus and wound. She needs to be able to seat HD recliner chair for HD. PT working with Courtney Gray.  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  She is now has been off of oral antibiotics. -Courtney Gray declined  antibiotics, due to concern for GI issues.  She will continue with wound care. -Doxycycline was discontinued 12/1  3-Calciphylaxis: Na-thiosulfate  per nephrology As needed oxycodone  4-Hypertension: Continue with midodrine on hemodialysis days BP increasing, might need low dose Norvasc if BP increases.   Phantom limb pain: Continue with Lyrica gabapentin has been reduced  Decubitus ulcer of sacral region stage III: Continue with wound care Hypothyroidism: Continue with Synthroid Diabetes type 2 with hyperlipidemia neuropathy: Continue with aspirin and statins Bacteremia: Courtney Gray has completed antibiotic therapy   PVD (peripheral vascular disease) ([place]) History of Bilateral AKA   Class 3 obesity (HCC) BMI >40    Chronic respiratory failure Due to morbid obesity  Constipation; plan for Dulcolax suppository today     Pressure Injury 11/12/21 Hip Right Unstageable - Full thickness tissue loss in which the base of the injury is covered by slough (yellow, tan, gray, green or brown) and/or eschar (tan, brown or black) in the wound bed. 90% covered with slough (Active)  11/12/21 0800  Location: Hip  Location Orientation: Right  Staging: Unstageable - Full thickness tissue loss in which the base of the injury is covered by slough (yellow, tan, gray, green or brown) and/or eschar (tan, brown or black) in the wound bed.  Wound Description (Comments): 90% covered with slough  Present on Admission: Yes  Dressing Type Foam - Lift dressing to assess site every shift 04/19/22 0500     Pressure Injury 11/24/21 Flank Left;Lower Stage 2 -  Partial thickness loss of dermis presenting as a shallow open injury with a red, pink wound bed without slough. (Active)  11/24/21 0430  Location:  Flank  Location Orientation: Left;Lower  Staging: Stage 2 -  Partial thickness loss of dermis presenting as a shallow open injury with a red, pink wound bed without slough.  Wound Description (Comments):    Present on Admission: No  Dressing Type Foam - Lift dressing to assess site every shift 04/18/22 2200      Estimated body mass index is 42.78 kg/m as calculated from the following:   Height as of this encounter: 5\' 5"  (1.651 m).   Weight as of this encounter: 116.6 kg.   DVT prophylaxis: Aspirin Code Status: Full code Family Communication: Care discussed with  Courtney Gray Disposition Plan:  Status is: Inpatient Remains inpatient appropriate because: Awaiting out Courtney Gray HD set up, she needs to be able to sit for HD    Consultants:  Nephrology ID   Procedures:    Antimicrobials:    Subjective: Seen in HD today.  No new complaints.  She will get suppository today   Objective: Vitals:   04/19/22 1200 04/19/22 1230 04/19/22 1239 04/19/22 1318  BP: (!) 142/84 (!) 146/105 (!) 142/77 (!) 145/69  Pulse: 87 (!) 55 (!) 57 64  Resp: 13 12 12 18   Temp:   98.7 F (37.1 C) 98.6 F (37 C)  TempSrc:   Oral Oral  SpO2: 96% 100% 100% 100%  Weight:   116.6 kg   Height:        Intake/Output Summary (Last 24 hours) at 04/19/2022 1427 Last data filed at 04/19/2022 1230 Gross per 24 hour  Intake 360 ml  Output 3700 ml  Net -3340 ml    Filed Weights   04/19/22 0500 04/19/22 0730 04/19/22 1239  Weight: 120.6 kg 120 kg 116.6 kg    Examination:  General exam: NAD Respiratory system: CTA Cardiovascular system: S 1, S 2  RRR Gastrointestinal system: BS present, soft, nt Central nervous system: Alert Extremities: BL AKA Skin: multiples wound with dressing.   Data Reviewed: I have personally reviewed following labs and imaging studies  CBC: Recent Labs  Lab 04/15/22 1435 04/16/22 0640 04/19/22 0500  WBC 8.6 8.2 6.5  NEUTROABS  --   --  4.2  HGB 8.3* 9.1* 8.2*  HCT 27.6* 29.7* 26.6*  MCV 95.8 92.0 93.3  PLT 181 201 563    Basic Metabolic Panel: Recent Labs  Lab 04/15/22 1438 04/16/22 0640 04/19/22 0800  NA 136 134* 137  K 6.6* 5.0 5.0  CL 101 94* 95*   CO2 24 26 27   GLUCOSE 102* 91 100*  BUN 78* 35* 44*  CREATININE 12.53* 6.85* 7.72*  CALCIUM 9.2 9.6 9.5  PHOS 7.0* 5.0* 6.3*    GFR: Estimated Creatinine Clearance: 11 mL/min (A) (by C-G formula based on SCr of 7.72 mg/dL (H)). Liver Function Tests: Recent Labs  Lab 04/15/22 1438 04/16/22 0640 04/19/22 0800  ALBUMIN 3.2* 3.2* 3.2*    No results for input(s): "LIPASE", "AMYLASE" in the last 168 hours. No results for input(s): "AMMONIA" in the last 168 hours. Coagulation Profile: No results for input(s): "INR", "PROTIME" in the last 168 hours. Cardiac Enzymes: No results for input(s): "CKTOTAL", "CKMB", "CKMBINDEX", "TROPONINI" in the last 168 hours. BNP (last 3 results) No results for input(s): "PROBNP" in the last 8760 hours. HbA1C: No results for input(s): "HGBA1C" in the last 72 hours. CBG: No results for input(s): "GLUCAP" in the last 168 hours. Lipid Profile: No results for input(s): "CHOL", "HDL", "LDLCALC", "TRIG", "CHOLHDL", "LDLDIRECT" in the last 72 hours. Thyroid Function Tests: No results  for input(s): "TSH", "T4TOTAL", "FREET4", "T3FREE", "THYROIDAB" in the last 72 hours. Anemia Panel: No results for input(s): "VITAMINB12", "FOLATE", "FERRITIN", "TIBC", "IRON", "RETICCTPCT" in the last 72 hours. Sepsis Labs: No results for input(s): "PROCALCITON", "LATICACIDVEN" in the last 168 hours.  No results found for this or any previous visit (from the past 240 hour(s)).       Radiology Studies: No results found.      Scheduled Meds:  aspirin EC  81 mg Oral Daily   atorvastatin  10 mg Oral Daily   Chlorhexidine Gluconate Cloth  6 each Topical Q0600   darbepoetin (ARANESP) injection - DIALYSIS  200 mcg Subcutaneous Q Tue-1800   docusate sodium  100 mg Oral BID   famotidine  20 mg Oral Daily   feeding supplement  1 Container Oral TID BM   gabapentin  100 mg Oral QHS   Gerhardt's butt cream   Topical BID   levothyroxine  100 mcg Oral Q0600   midodrine   10 mg Oral Once per day on Tue Thu Sat   pantoprazole  40 mg Oral Daily   pregabalin  50 mg Oral Daily   sevelamer carbonate  4.8 g Oral TID WC   sucroferric oxyhydroxide  500 mg Oral TID WC   Continuous Infusions:  sodium thiosulfate 12.5 g in sodium chloride 0.9 % 150 mL 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 (04/19/22 1130)     LOS: 169 days    Time spent: 35 minutes    Miel Wisener A Lamyia Cdebaca, MD [facility]   If 7PM-7AM, please contact night-coverage [email or web address]  04/19/2022, 2:27 PM

## 2022-04-20 DIAGNOSIS — N186 End stage renal disease: Secondary | ICD-10-CM | POA: Diagnosis not present

## 2022-04-20 MED ORDER — SODIUM THIOSULFATE 250 MG/ML IV SOLN
12.5000 g | INTRAVENOUS | Status: DC
  Administered 2022-04-22 – 2022-05-06 (×5): 12.5 g via INTRAVENOUS
  Filled 2022-04-20 (×9): qty 50

## 2022-04-20 NOTE — Plan of Care (Signed)
  Problem: Health Behavior/Discharge Planning: Goal: Ability to manage health-related needs will improve Outcome: Progressing   Problem: Clinical Measurements: Goal: Ability to maintain clinical measurements within normal limits will improve Outcome: Progressing Goal: Will remain free from infection Outcome: Progressing Goal: Respiratory complications will improve Outcome: Progressing   

## 2022-04-20 NOTE — Progress Notes (Signed)
PROGRESS NOTE    Courtney Gray  SWH:[id number] DOB: [birth year] DOA: 11/01/2021 PCP: Monico Blitz, MD   Brief Narrative: 52 year old with past medical history significant for ESRD on hemodialysis TTS, recent hospitalization for bacteremia, hepatic and splenic abscess on long-term IV antibiotics, diastolic heart failure, PVD status post bilateral AKA, insulin-dependent diabetes type 2, morbid obesity and chronic sacral wound presents with progressive shortness of breath in the setting of missing hemodialysis and admitted for hypertensive emergency. Patient with long length of stay, discharge pending due to ability to obtain outpatient hemodialysis that can accumulate patient's mobility constraints secondary to decubitus ulcer wound and bilateral AKA.    Assessment & Plan:   Active Problems:   End stage renal disease ([place])   Hypertension   Decubitus ulcer of sacral region, stage 3 (HCC)   Type 2 diabetes mellitus with hyperlipidemia (HCC)   Chronic respiratory failure with hypoxia (HCC)   Bacteremia   PVD (peripheral vascular disease) (HCC)   Class 3 obesity (HCC)   Anemia due to chronic kidney disease   Hypertensive emergency   Leukocytosis   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HCC)   Pressure injury of skin   PAD (peripheral artery disease) (HCC)   Irritable bowel syndrome (IBS)   Amputation of left middle finger   GERD (gastroesophageal reflux disease)   Nodule of skin of abdomen   1-ESRD, HD: -Nephrology Following, TTS schedule. -Need outpatient dialysis center that is able to accommodate body habitus and wound. She needs to be able to seat HD recliner chair for HD. PT working with patient. Needs Bariatric HD chair in the unit to accommodate patient.  -Working on tolerance sitting in the chair -Continue with midodrine.  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   She is now has been off of oral antibiotics. -Patient declined antibiotics, due to concern for GI issues.  She will continue with wound care. -Doxycycline was discontinued 12/1  3-Calciphylaxis: Na-thiosulfate  per nephrology As needed oxycodone  4-Hypertension: Continue with midodrine on hemodialysis days BP increasing, might need low dose Norvasc if BP increases.   Phantom limb pain: Continue with Lyrica gabapentin has been reduced  Decubitus ulcer of sacral region stage III: Continue with wound care Hypothyroidism: Continue with Synthroid Diabetes type 2 with hyperlipidemia neuropathy: Continue with aspirin and statins Bacteremia: Patient has completed antibiotic therapy   PVD (peripheral vascular disease) ([place]) History of Bilateral AKA   Class 3 obesity (HCC) BMI >40    Chronic respiratory failure Due to morbid obesity  Constipation; Patient has delay suppository last 3 days.    Pressure Injury 11/12/21 Hip Right Unstageable - Full thickness tissue loss in which the base of the injury is covered by slough (yellow, tan, gray, green or brown) and/or eschar (tan, brown or black) in the wound bed. 90% covered with slough (Active)  11/12/21 0800  Location: Hip  Location Orientation: Right  Staging: Unstageable - Full thickness tissue loss in which the base of the injury is covered by slough (yellow, tan, gray, green or brown) and/or eschar (tan, brown or black) in the wound bed.  Wound Description (Comments): 90% covered with slough  Present on Admission: Yes  Dressing Type Foam - Lift dressing to assess site every shift 04/20/22 0854     Pressure Injury 11/24/21 Flank Left;Lower Stage 2 -  Partial thickness loss of dermis presenting as a shallow open injury with a red,  pink wound bed without slough. (Active)  11/24/21 0430  Location: Flank  Location Orientation: Left;Lower  Staging: Stage 2 -  Partial thickness loss of dermis presenting as a shallow open injury with a red,  pink wound bed without slough.  Wound Description (Comments):   Present on Admission: No  Dressing Type Foam - Lift dressing to assess site every shift 04/19/22 2300      Estimated body mass index is 42.78 kg/m as calculated from the following:   Height as of this encounter: 5\' 5"  (1.651 m).   Weight as of this encounter: 116.6 kg.   DVT prophylaxis: Aspirin Code Status: Full code Family Communication: Care discussed with  patient Disposition Plan:  Status is: Inpatient Remains inpatient appropriate because: Awaiting out patient HD set up, she needs to be able to sit for HD    Consultants:  Nephrology ID   Procedures:    Antimicrobials:    Subjective: She couldn't sleep last nigh due to pain in her wound. Wound doesn't look infected.  She will ask for tylenol when she gets oxy. She agrees not to increase oxy because of oversedation risk. Marland Kitchen She will do dulcolax supp today   Objective: Vitals:   04/19/22 1747 04/19/22 2134 04/20/22 0536 04/20/22 0950  BP: 139/68 (!) 109/96 (!) 135/50 (!) 149/91  Pulse: 68 62 69 68  Resp: 17 18 18 17   Temp: 98.5 F (36.9 C) 97.9 F (36.6 C) 98.1 F (36.7 C) 97.6 F (36.4 C)  TempSrc:  Oral Oral Oral  SpO2: 99% 100% 100% 99%  Weight:      Height:        Intake/Output Summary (Last 24 hours) at 04/20/2022 1331 Last data filed at 04/20/2022 0859 Gross per 24 hour  Intake 750 ml  Output 0 ml  Net 750 ml    Filed Weights   04/19/22 0500 04/19/22 0730 04/19/22 1239  Weight: 120.6 kg 120 kg 116.6 kg    Examination:  General exam: NAD Respiratory system: CTA Cardiovascular system: S 1, S 2 RRR Gastrointestinal system: BS present, soft, nt Central nervous system: Alert Extremities: BL AKA Skin: multiples wound with dressing.   Data Reviewed: I have personally reviewed following labs and imaging studies  CBC: Recent Labs  Lab 04/15/22 1435 04/16/22 0640 04/19/22 0500  WBC 8.6 8.2 6.5  NEUTROABS  --   --  4.2   HGB 8.3* 9.1* 8.2*  HCT 27.6* 29.7* 26.6*  MCV 95.8 92.0 93.3  PLT 181 201 485    Basic Metabolic Panel: Recent Labs  Lab 04/15/22 1438 04/16/22 0640 04/19/22 0800  NA 136 134* 137  K 6.6* 5.0 5.0  CL 101 94* 95*  CO2 24 26 27   GLUCOSE 102* 91 100*  BUN 78* 35* 44*  CREATININE 12.53* 6.85* 7.72*  CALCIUM 9.2 9.6 9.5  PHOS 7.0* 5.0* 6.3*    GFR: Estimated Creatinine Clearance: 11 mL/min (A) (by C-G formula based on SCr of 7.72 mg/dL (H)). Liver Function Tests: Recent Labs  Lab 04/15/22 1438 04/16/22 0640 04/19/22 0800  ALBUMIN 3.2* 3.2* 3.2*    No results for input(s): "LIPASE", "AMYLASE" in the last 168 hours. No results for input(s): "AMMONIA" in the last 168 hours. Coagulation Profile: No results for input(s): "INR", "PROTIME" in the last 168 hours. Cardiac Enzymes: No results for input(s): "CKTOTAL", "CKMB", "CKMBINDEX", "TROPONINI" in the last 168 hours. BNP (last 3 results) No results for input(s): "PROBNP" in the last 8760 hours. HbA1C: No results for  input(s): "HGBA1C" in the last 72 hours. CBG: No results for input(s): "GLUCAP" in the last 168 hours. Lipid Profile: No results for input(s): "CHOL", "HDL", "LDLCALC", "TRIG", "CHOLHDL", "LDLDIRECT" in the last 72 hours. Thyroid Function Tests: No results for input(s): "TSH", "T4TOTAL", "FREET4", "T3FREE", "THYROIDAB" in the last 72 hours. Anemia Panel: No results for input(s): "VITAMINB12", "FOLATE", "FERRITIN", "TIBC", "IRON", "RETICCTPCT" in the last 72 hours. Sepsis Labs: No results for input(s): "PROCALCITON", "LATICACIDVEN" in the last 168 hours.  No results found for this or any previous visit (from the past 240 hour(s)).       Radiology Studies: No results found.      Scheduled Meds:  aspirin EC  81 mg Oral Daily   atorvastatin  10 mg Oral Daily   Chlorhexidine Gluconate Cloth  6 each Topical Q0600   darbepoetin (ARANESP) injection - DIALYSIS  200 mcg Subcutaneous Q Tue-1800    docusate sodium  100 mg Oral BID   famotidine  20 mg Oral Daily   feeding supplement  1 Container Oral TID BM   gabapentin  100 mg Oral QHS   Gerhardt's butt cream   Topical BID   levothyroxine  100 mcg Oral Q0600   midodrine  10 mg Oral Once per day on Tue Thu Sat   pantoprazole  40 mg Oral Daily   pregabalin  50 mg Oral Daily   sevelamer carbonate  4.8 g Oral TID WC   sucroferric oxyhydroxide  500 mg Oral TID WC   Continuous Infusions:  sodium thiosulfate 12.5 g in sodium chloride 0.9 % 150 mL 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 (04/19/22 1130)     LOS: 170 days    Time spent: 35 minutes    Craige Patel A Pedram Goodchild, MD [facility]   If 7PM-7AM, please contact night-coverage [email or web address]  04/20/2022, 1:31 PM

## 2022-04-21 DIAGNOSIS — L89312 Pressure ulcer of right buttock, stage 2: Secondary | ICD-10-CM | POA: Diagnosis not present

## 2022-04-21 DIAGNOSIS — E669 Obesity, unspecified: Secondary | ICD-10-CM | POA: Diagnosis not present

## 2022-04-21 NOTE — Progress Notes (Signed)
PROGRESS NOTE    Courtney Gray  JYN:[id number] DOB: [birth year] DOA: 11/01/2021 PCP: Monico Blitz, MD   Brief Narrative: 52 year old with past medical history significant for ESRD on hemodialysis TTS, recent hospitalization for bacteremia, hepatic and splenic abscess on long-term IV antibiotics, diastolic heart failure, PVD status post bilateral AKA, insulin-dependent diabetes type 2, morbid obesity and chronic sacral wound presents with progressive shortness of breath in the setting of missing hemodialysis and admitted for hypertensive emergency. Patient with long length of stay, discharge pending due to ability to obtain outpatient hemodialysis that can accumulate patient's mobility constraints secondary to decubitus ulcer wound and bilateral AKA.    Assessment & Plan:   Active Problems:   End stage renal disease ([place])   Hypertension   Decubitus ulcer of sacral region, stage 3 (HCC)   Type 2 diabetes mellitus with hyperlipidemia (HCC)   Chronic respiratory failure with hypoxia (HCC)   Bacteremia   PVD (peripheral vascular disease) (HCC)   Class 3 obesity (HCC)   Anemia due to chronic kidney disease   Hypertensive emergency   Leukocytosis   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HCC)   Pressure injury of skin   PAD (peripheral artery disease) (HCC)   Irritable bowel syndrome (IBS)   Amputation of left middle finger   GERD (gastroesophageal reflux disease)   Nodule of skin of abdomen   1-ESRD, HD: -Nephrology Following, TTS schedule. -Need outpatient dialysis center that is able to accommodate body habitus and wound. She needs to be able to seat HD recliner chair for HD. PT working with patient. Needs Bariatric HD chair in the unit to accommodate patient.  -Working on tolerance sitting in the chair -Continue with midodrine.  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   She is now has been off of oral antibiotics. -Patient declined antibiotics, due to concern for GI issues.  She will continue with wound care. -Doxycycline was discontinued 12/1  3-Calciphylaxis: Abdominal wound.  Na-thiosulfate  per nephrology As needed oxycodone Continue with wound care.  She relates wound abdomen lateral pain is not worse. She decline imagine.   4-Hypertension: Continue with midodrine on hemodialysis days BP increasing, might need low dose Norvasc if BP increases.   Phantom limb pain: Continue with Lyrica gabapentin has been reduced  Decubitus ulcer of sacral region stage III: Continue with wound care Hypothyroidism: Continue with Synthroid Diabetes type 2 with hyperlipidemia neuropathy: Continue with aspirin and statins Bacteremia: Patient has completed antibiotic therapy   PVD (peripheral vascular disease) ([place]) History of Bilateral AKA   Class 3 obesity (HCC) BMI >40    Chronic respiratory failure Due to morbid obesity  Constipation; Patient has delay suppository last 3 days.    Pressure Injury 11/12/21 Hip Right Unstageable - Full thickness tissue loss in which the base of the injury is covered by slough (yellow, tan, gray, green or brown) and/or eschar (tan, brown or black) in the wound bed. 90% covered with slough (Active)  11/12/21 0800  Location: Hip  Location Orientation: Right  Staging: Unstageable - Full thickness tissue loss in which the base of the injury is covered by slough (yellow, tan, gray, green or brown) and/or eschar (tan, brown or black) in the wound bed.  Wound Description (Comments): 90% covered with slough  Present on Admission: Yes  Dressing Type Foam - Lift dressing to assess site every shift 04/21/22 0730     Pressure Injury  11/24/21 Flank Left;Lower Stage 2 -  Partial thickness loss of dermis presenting as a shallow open injury with a red, pink wound bed without slough. (Active)  11/24/21 0430  Location: Flank  Location  Orientation: Left;Lower  Staging: Stage 2 -  Partial thickness loss of dermis presenting as a shallow open injury with a red, pink wound bed without slough.  Wound Description (Comments):   Present on Admission: No  Dressing Type Foam - Lift dressing to assess site every shift 04/21/22 0730      Estimated body mass index is 42.78 kg/m as calculated from the following:   Height as of this encounter: 5\' 5"  (1.651 m).   Weight as of this encounter: 116.6 kg.   DVT prophylaxis: Aspirin Code Status: Full code Family Communication: Care discussed with  patient Disposition Plan:  Status is: Inpatient Remains inpatient appropriate because: Awaiting out patient HD set up, she needs to be able to sit for HD    Consultants:  Nephrology ID   Procedures:    Antimicrobials:    Subjective: She couldn't sleep last night. Sleepy today   Objective: Vitals:   04/20/22 0950 04/20/22 2030 04/21/22 0652 04/21/22 0724  BP: (!) 149/91 122/76 (!) 148/75 (!) 147/58  Pulse: 68 60 66 65  Resp: 17 18 16 20   Temp: 97.6 F (36.4 C) 97.8 F (36.6 C) 98.5 F (36.9 C) 97.8 F (36.6 C)  TempSrc: Oral Oral Oral Oral  SpO2: 99% 100% 100% 100%  Weight:      Height:        Intake/Output Summary (Last 24 hours) at 04/21/2022 1430 Last data filed at 04/21/2022 1300 Gross per 24 hour  Intake 480 ml  Output 0 ml  Net 480 ml    Filed Weights   04/19/22 0500 04/19/22 0730 04/19/22 1239  Weight: 120.6 kg 120 kg 116.6 kg    Examination:  General exam: NAD Respiratory system: CTA Cardiovascular system: S 1, S 2 RRR Gastrointestinal system: BS present, soft, nt Central nervous system: alert Extremities: BL AKA Skin: multiples wound with dressing.   Data Reviewed: I have personally reviewed following labs and imaging studies  CBC: Recent Labs  Lab 04/15/22 1435 04/16/22 0640 04/19/22 0500  WBC 8.6 8.2 6.5  NEUTROABS  --   --  4.2  HGB 8.3* 9.1* 8.2*  HCT 27.6* 29.7* 26.6*  MCV  95.8 92.0 93.3  PLT 181 201 924    Basic Metabolic Panel: Recent Labs  Lab 04/15/22 1438 04/16/22 0640 04/19/22 0800  NA 136 134* 137  K 6.6* 5.0 5.0  CL 101 94* 95*  CO2 24 26 27   GLUCOSE 102* 91 100*  BUN 78* 35* 44*  CREATININE 12.53* 6.85* 7.72*  CALCIUM 9.2 9.6 9.5  PHOS 7.0* 5.0* 6.3*    GFR: Estimated Creatinine Clearance: 11 mL/min (A) (by C-G formula based on SCr of 7.72 mg/dL (H)). Liver Function Tests: Recent Labs  Lab 04/15/22 1438 04/16/22 0640 04/19/22 0800  ALBUMIN 3.2* 3.2* 3.2*    No results for input(s): "LIPASE", "AMYLASE" in the last 168 hours. No results for input(s): "AMMONIA" in the last 168 hours. Coagulation Profile: No results for input(s): "INR", "PROTIME" in the last 168 hours. Cardiac Enzymes: No results for input(s): "CKTOTAL", "CKMB", "CKMBINDEX", "TROPONINI" in the last 168 hours. BNP (last 3 results) No results for input(s): "PROBNP" in the last 8760 hours. HbA1C: No results for input(s): "HGBA1C" in the last 72 hours. CBG: No results for input(s): "GLUCAP" in  the last 168 hours. Lipid Profile: No results for input(s): "CHOL", "HDL", "LDLCALC", "TRIG", "CHOLHDL", "LDLDIRECT" in the last 72 hours. Thyroid Function Tests: No results for input(s): "TSH", "T4TOTAL", "FREET4", "T3FREE", "THYROIDAB" in the last 72 hours. Anemia Panel: No results for input(s): "VITAMINB12", "FOLATE", "FERRITIN", "TIBC", "IRON", "RETICCTPCT" in the last 72 hours. Sepsis Labs: No results for input(s): "PROCALCITON", "LATICACIDVEN" in the last 168 hours.  No results found for this or any previous visit (from the past 240 hour(s)).       Radiology Studies: No results found.      Scheduled Meds:  aspirin EC  81 mg Oral Daily   atorvastatin  10 mg Oral Daily   Chlorhexidine Gluconate Cloth  6 each Topical Q0600   darbepoetin (ARANESP) injection - DIALYSIS  200 mcg Subcutaneous Q Tue-1800   docusate sodium  100 mg Oral BID   famotidine  20 mg  Oral Daily   feeding supplement  1 Container Oral TID BM   gabapentin  100 mg Oral QHS   Gerhardt's butt cream   Topical BID   levothyroxine  100 mcg Oral Q0600   midodrine  10 mg Oral Once per day on Tue Thu Sat   pantoprazole  40 mg Oral Daily   pregabalin  50 mg Oral Daily   sevelamer carbonate  4.8 g Oral TID WC   sucroferric oxyhydroxide  500 mg Oral TID WC   Continuous Infusions:  [START ON 04/22/2022] sodium thiosulfate 12.5 g in sodium chloride 0.9 % 150 mL Infusion for Calciphylaxis       LOS: 171 days    Time spent: 35 minutes    Courtney Dedeaux A Anaysia Germer, MD [facility]   If 7PM-7AM, please contact night-coverage [email or web address]  04/21/2022, 2:30 PM

## 2022-04-21 NOTE — TOC Progression Note (Signed)
Transition of Care [facility]) - Initial/Assessment Note    Patient Details  Name: Courtney Gray MRN: [id number] Date of Birth: July 20, 1970  Transition of [facility]) CM/SW Contact:    Milinda Antis, LCSWA Phone Number: 04/21/2022, 1:36 PM  Clinical Narrative:                 LCSW contacted by Dr. Ree Kida in reference to bariatric dialysis chair for patient to start trials of sitting for dialysis.  Dr. Ree Kida will forward request for chair to Dr. Hulen Skains.    Expected Discharge Plan: Long Term Nursing Home Barriers to Discharge: No SNF bed, Transportation, Waiting for outpatient dialysis   Patient Goals and CMS Choice Patient states their goals for this hospitalization and ongoing recovery are:: To be at a facility close to her daughter          Expected Discharge Plan and Services       Living arrangements for the past 2 months: Single Family Home                                      Prior Living Arrangements/Services Living arrangements for the past 2 months: Single Family Home Lives with:: Adult Children Patient language and need for interpreter reviewed:: Yes        Need for Family Participation in Patient Care: Yes (Comment) Care giver support system in place?: No (comment)   Criminal Activity/Legal Involvement Pertinent to Current Situation/Hospitalization: No - Comment as needed  Activities of Daily Living Home Assistive Devices/Equipment: [employer] fast streamer ADL Screening (condition at time of admission) Patient's cognitive ability adequate to safely complete daily activities?: No Is the patient deaf or have difficulty hearing?: No Does the patient have difficulty seeing, even when wearing glasses/contacts?: No Does the patient have difficulty concentrating, remembering, or making decisions?: No Patient able to express need for assistance with ADLs?: Yes Does the patient have difficulty dressing or bathing?: Yes Independently performs ADLs?: No Does the patient  have difficulty walking or climbing stairs?: Yes Weakness of Legs: Both Weakness of Arms/Hands: Both  Permission Sought/Granted Permission sought to share information with : Other (comment) Permission granted to share information with : Yes, Verbal Permission Granted  Share Information with NAME: Zamarripa,antoinette (Daughter)   [phone]  Permission granted to share info w AGENCY: SNF        Emotional Assessment Appearance:: Appears older than stated age Attitude/Demeanor/Rapport: Engaged Affect (typically observed): Tearful/Crying, Accepting Orientation: : Oriented to Situation, Oriented to  Time, Oriented to Place, Oriented to Self Alcohol / Substance Use: Not Applicable Psych Involvement: No (comment)  Admission diagnosis:  End stage renal disease ([place]) [N18.6] Fluid overload [E87.70] Obesity with serious comorbidity, unspecified classification, unspecified obesity type [E66.9] Pressure injury of right buttock, stage 2 ([place]) [L89.312] Patient Active Problem List   Diagnosis Date Noted   Nodule of skin of abdomen 02/16/2022   Acute on chronic systolic CHF (congestive heart failure) ([place]) 02/11/2022   Acute on chronic diastolic CHF (congestive heart failure) ([place]) 12/12/2021   PAD (peripheral artery disease) ([place]) 12/12/2021   Irritable bowel syndrome (IBS) 12/12/2021   Amputation of left middle finger 12/12/2021   GERD (gastroesophageal reflux disease) 12/12/2021   Hypertension    Type 2 diabetes mellitus with hyperlipidemia ([place])    Decubitus ulcer of sacral region, stage 3 ([place])    Class 3 obesity ([place])    Bacteremia    PVD (  peripheral vascular disease) ([place])    Hepatic abscess    Pressure injury of skin 09/13/2021   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[place])    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   Lactic acidosis 09/10/2021   Mixed diabetic hyperlipidemia associated with type 2 diabetes mellitus ([place]) 09/10/2021   Coronary artery disease involving native coronary  artery of native heart without angina pectoris 09/10/2021   Elevated troponin level not due myocardial infarction 09/10/2021   Hypotension 09/10/2021   Dry gangrene (HCC)    Pain 08/03/2021   Ischemic foot pain at rest 08/03/2021   Hypoglycemia 07/10/2021   Volume overload 07/09/2021   Leukocytosis 06/11/2021   Acute gout 06/11/2021   Hyperkalemia 05/21/2021   Hemorrhoids    Hyperlipidemia 05/18/2021   Rectal bleeding 05/17/2021   Morbid obesity with BMI of 60.0-69.9, adult ([place]) 05/17/2021   NSTEMI (non-ST elevated myocardial infarction) ([place]) 05/16/2021   Essential hypertension 09/01/2020   End stage renal disease ([place]) 09/01/2020   Chronic respiratory failure with hypoxia ([place]) 09/01/2020   Hypertensive emergency    Hypertensive urgency 11/28/2019   Uncontrolled type 2 diabetes mellitus with hypoglycemia, with long-term current use of insulin ([place]) 11/28/2019   Anemia due to chronic kidney disease 11/28/2019   PCP:  Monico Blitz, MD Pharmacy:   [facility], [place] [address] [place] [address] Phone: [phone] Fax: [phone]  [facility] [address] [place] [address] Phone: [phone] Fax: [phone]  [place], [place] - [address] [place] [address] Phone: [phone] Fax: [phone]     Social Determinants of Health (SDOH) Social History: [place]: No Food Insecurity (12/11/2021)  Housing: Low Risk  (12/11/2021)  Transportation Needs: No Transportation Needs (12/11/2021)  Utilities: At Risk (12/11/2021)  Tobacco Use: Low Risk  (03/07/2022)   SDOH Interventions:     Readmission Risk Interventions    09/16/2021    4:36 PM 08/28/2021   11:50 AM 07/12/2021   12:38 PM  Readmission Risk Prevention Plan  Transportation Screening Complete Complete Complete  Medication Review Press photographer)   Complete Complete  PCP or Specialist appointment within 3-5 days of discharge  Complete Complete  HRI or Home Care Consult Complete Complete Complete  SW Recovery Care/Counseling Consult Complete Complete Complete  Palliative Care Screening  Not Applicable Not Applicable  Skilled Nursing Facility Complete Not Applicable Complete

## 2022-04-21 NOTE — Progress Notes (Signed)
[facility] Progress Note   Subjective:    Seen in room. Sleepy, didn't want to talk much today. Last dialysis Saturday -net UF 3.7L   Objective Vitals:   04/20/22 0950 04/20/22 2030 04/21/22 0652 04/21/22 0724  BP: (!) 149/91 122/76 (!) 148/75 (!) 147/58  Pulse: 68 60 66 65  Resp: 17 18 16 20   Temp: 97.6 F (36.4 C) 97.8 F (36.6 C) 98.5 F (36.9 C) 97.8 F (36.6 C)  TempSrc: Oral Oral Oral Oral  SpO2: 99% 100% 100% 100%  Weight:      Height:       Physical Exam General: Obese female,nad  Heart: RRR systolic murmur  Lungs: Clear bilaterally  Abdomen: soft and non-tender Extremities: No stump edema  Dialysis Access: [facility]   Filed Weights   04/19/22 0500 04/19/22 0730 04/19/22 1239  Weight: 120.6 kg 120 kg 116.6 kg    Intake/Output Summary (Last 24 hours) at 04/21/2022 1300 Last data filed at 04/21/2022 0900 Gross per 24 hour  Intake 240 ml  Output 0 ml  Net 240 ml     Additional Objective Labs: Basic Metabolic Panel: Recent Labs  Lab 04/15/22 1438 04/16/22 0640 04/19/22 0800  NA 136 134* 137  K 6.6* 5.0 5.0  CL 101 94* 95*  CO2 24 26 27   GLUCOSE 102* 91 100*  BUN 78* 35* 44*  CREATININE 12.53* 6.85* 7.72*  CALCIUM 9.2 9.6 9.5  PHOS 7.0* 5.0* 6.3*    Liver Function Tests: Recent Labs  Lab 04/15/22 1438 04/16/22 0640 04/19/22 0800  ALBUMIN 3.2* 3.2* 3.2*    No results for input(s): "LIPASE", "AMYLASE" in the last 168 hours. CBC: Recent Labs  Lab 04/15/22 1435 04/16/22 0640 04/19/22 0500  WBC 8.6 8.2 6.5  NEUTROABS  --   --  4.2  HGB 8.3* 9.1* 8.2*  HCT 27.6* 29.7* 26.6*  MCV 95.8 92.0 93.3  PLT 181 201 175    Blood Culture    Component Value Date/Time   SDES BLOOD LEFT ANTECUBITAL 10/16/2021 1534   SPECREQUEST  10/16/2021 1534    BOTTLES DRAWN AEROBIC AND ANAEROBIC Blood Culture results may not be optimal due to an inadequate volume of blood received in culture bottles   CULT  10/16/2021 1534    NO GROWTH 5  DAYS Performed at [facility], [place] [address] [place], [place] [address]    REPTSTATUS 10/21/2021 FINAL 10/16/2021 1534    Cardiac Enzymes: No results for input(s): "CKTOTAL", "CKMB", "CKMBINDEX", "TROPONINI" in the last 168 hours. CBG: No results for input(s): "GLUCAP" in the last 168 hours. Iron Studies: No results for input(s): "IRON", "TIBC", "TRANSFERRIN", "FERRITIN" in the last 72 hours. Lab Results  Component Value Date   INR 1.3 (H) 10/09/2021   INR 1.3 (H) 09/10/2021   INR 1.3 (H) 07/10/2021   Studies/Results: No results found.  Medications:  [START ON 04/22/2022] sodium thiosulfate 12.5 g in sodium chloride 0.9 % 150 mL Infusion for Calciphylaxis      aspirin EC  81 mg Oral Daily   atorvastatin  10 mg Oral Daily   Chlorhexidine Gluconate Cloth  6 each Topical Q0600   darbepoetin (ARANESP) injection - DIALYSIS  200 mcg Subcutaneous Q Tue-1800   docusate sodium  100 mg Oral BID   famotidine  20 mg Oral Daily   feeding supplement  1 Container Oral TID BM   gabapentin  100 mg Oral QHS   Gerhardt's butt cream   Topical BID  levothyroxine  100 mcg Oral Q0600   midodrine  10 mg Oral Once per day on Tue Thu Sat   pantoprazole  40 mg Oral Daily   pregabalin  50 mg Oral Daily   sevelamer carbonate  4.8 g Oral TID WC   sucroferric oxyhydroxide  500 mg Oral TID WC    Assessment/Plan:   Debility: Has been unable to tolerate dialysis in recliner and therefore unable to discharge to outpatient dialysis. Per her report has lost >50 lbs during admission. Sacral decub ulcers remain a barrier. She now has new powerchair/cushion and has been sitting for longer periods.  Appreciate efforts from PT team.  ESRD: Was usual TTS schedule .  Refusing HD at times. Back on schedule. Next HD 1/16. HD access: new TDC placed 02/04/2022, TDC stopped working again 11/30. IR consulted - S/p venogram, angioplasty of SVC and innominate veins, and TDC exchange. No recent issues reported.   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 Started empirically on sodium thiosulfate, but caused vomiting so stopped - now getting 1/2 dose - 12.5mg  q HD, refuses increased dose. Not on any calcium based products or vitamin D. HTN /volume: On midodrine 10mg  qHD, with 10mg  prn mid HD w/parameters to keep SBP >100 d/t becoming symptomatic when it drops lower. Significant weight loss this admit Severe ischial sacral decubitus wound: CT showing chronic osteomyelitis. Limiting her, noted to be slow healing. Goal to tolerate sitting upright - which she is doing with PT.  COPD/Chronic O2 use: Prior O2 at home (5L/min) -> tapered to 2-3L/min here Anemia of ESRD: Hgb 9.1 >8.2  on max dose Aranesp 252mcg q Tues. Recent Fe load completed.  Secondary hyperparathyroidism: On two binders. CorrCa elevated. Not on VDRA, last PTH 86 (low) on 03/11/22. Avoid all calcium based medications for Vitamin D d/t concern for calciphylaxis.  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 Nutrition -ALB 3.2 diet continues to be changed from renal.  Currently on regular diet.  Would really prefer she be on a renal diet but patient has been refusing Disposition: Difficult situation. Prior to admit, patient required 6 staff members and 2 hoyer pads for transfer into HD chair. Could not stay on HD for full tx. 2/2 pain of multiple decubitus ulcers and PTAR unable to lift pt into ambulance for transport.SW working on LTC at [employer]. Will need to tolerate 5-6hrs in a chair to tolerate OP dialysis including transport, treatment and cannulation time.  The main issue is her Rt ischial wound. She now has new power chair and cushion. She has been sitting in the chair for longer periods. Up to 4 hours past  week     [name] PA-C [facility] 04/21/2022,1:02 PM

## 2022-04-21 NOTE — Consult Note (Signed)
WOC consulted for increased tenderness of the right ischial wound, if concerned for changes in pain/tenderness, consider radiologic evaluation.  I would not be able to assess for deep wound bed changes at this point due to size and narrowing of the tunnel. Chronic wound in the presence of ESRD, I do not expect this wound to heal without plastic surgery intervention at this point, the current wound care is more of a palliative tx to keep the wound clean and absorb exudate TY  This communication was via [place] as well.   [place], [place], [place]

## 2022-04-22 DIAGNOSIS — L89312 Pressure ulcer of right buttock, stage 2: Secondary | ICD-10-CM | POA: Diagnosis not present

## 2022-04-22 MED ORDER — HYDROXYZINE HCL 10 MG PO TABS
10.0000 mg | ORAL_TABLET | Freq: Once | ORAL | Status: AC | PRN
  Administered 2022-04-22: 10 mg via ORAL
  Filled 2022-04-22: qty 1

## 2022-04-22 MED ORDER — LIDOCAINE-PRILOCAINE 2.5-2.5 % EX CREA: 1.0000 | TOPICAL_CREAM | CUTANEOUS | Status: DC | PRN

## 2022-04-22 MED ORDER — HEPARIN SODIUM (PORCINE) 1000 UNIT/ML IJ SOLN
INTRAMUSCULAR | Status: AC
  Administered 2022-04-22: 4200 [IU]
  Filled 2022-04-22: qty 5

## 2022-04-22 MED ORDER — LIDOCAINE HCL (PF) 1 % IJ SOLN: 5.0000 mL | INTRAMUSCULAR | Status: DC | PRN

## 2022-04-22 MED ORDER — ANTICOAGULANT SODIUM CITRATE 4% (200MG/5ML) IV SOLN: 5.0000 mL | Status: DC | PRN

## 2022-04-22 MED ORDER — HEPARIN SODIUM (PORCINE) 1000 UNIT/ML IJ SOLN
INTRAMUSCULAR | Status: AC
  Filled 2022-04-22: qty 3

## 2022-04-22 MED ORDER — HEPARIN SODIUM (PORCINE) 1000 UNIT/ML DIALYSIS
20.0000 [IU]/kg | INTRAMUSCULAR | Status: DC | PRN
  Administered 2022-04-22: 4200 [IU] via INTRAVENOUS_CENTRAL
  Administered 2022-04-22: 2300 [IU] via INTRAVENOUS_CENTRAL
  Filled 2022-04-22 (×2): qty 3

## 2022-04-22 MED ORDER — HEPARIN SODIUM (PORCINE) 1000 UNIT/ML DIALYSIS: 1000.0000 [IU] | INTRAMUSCULAR | Status: DC | PRN

## 2022-04-22 MED ORDER — PENTAFLUOROPROP-TETRAFLUOROETH EX AERO: 1.0000 | INHALATION_SPRAY | CUTANEOUS | Status: DC | PRN

## 2022-04-22 MED ORDER — ALTEPLASE 2 MG IJ SOLR: 2.0000 mg | Freq: Once | INTRAMUSCULAR | Status: DC | PRN

## 2022-04-22 NOTE — Progress Notes (Signed)
PROGRESS NOTE    Courtney Gray  SWN:[id number] DOB: [birth year] DOA: 11/01/2021 PCP: Monico Blitz, MD   Brief Narrative: 52 year old with past medical history significant for ESRD on hemodialysis TTS, recent hospitalization for bacteremia, hepatic and splenic abscess on long-term IV antibiotics, diastolic heart failure, PVD status post bilateral AKA, insulin-dependent diabetes type 2, morbid obesity and chronic sacral wound presents with progressive shortness of breath in the setting of missing hemodialysis and admitted for hypertensive emergency. Patient with long length of stay, discharge pending due to ability to obtain outpatient hemodialysis that can accumulate patient's mobility constraints secondary to decubitus ulcer wound and bilateral AKA.  Patient will need to be able to seat HD chair to be able to get out patient HD set up. Hospital HD unit is working on getting bariatric HD chair to be able to accommodate patient.     Assessment & Plan:   Active Problems:   End stage renal disease ([place])   Hypertension   Decubitus ulcer of sacral region, stage 3 (HCC)   Type 2 diabetes mellitus with hyperlipidemia (HCC)   Chronic respiratory failure with hypoxia (HCC)   Bacteremia   PVD (peripheral vascular disease) (HCC)   Class 3 obesity (HCC)   Anemia due to chronic kidney disease   Hypertensive emergency   Leukocytosis   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HCC)   Pressure injury of skin   PAD (peripheral artery disease) (HCC)   Irritable bowel syndrome (IBS)   Amputation of left middle finger   GERD (gastroesophageal reflux disease)   Nodule of skin of abdomen   1-ESRD, HD: -Nephrology Following, TTS schedule. -Need outpatient dialysis center that is able to accommodate body habitus and wound. She needs to be able to seat HD recliner chair for HD. PT working with patient. Needs Bariatric HD chair in the unit to accommodate patient.  -Working on tolerance sitting in the  chair -Continue with midodrine.  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  She is now has been off of oral antibiotics. -Patient declined antibiotics, due to concern for GI issues.  She will continue with wound care. -Doxycycline was discontinued 12/1  3-Calciphylaxis: Abdominal wound.  Na-thiosulfate  per nephrology As needed oxycodone Continue with wound care.  She relates pain is not worse. She declines further imagines.   4-Hypotension during HD: Continue with midodrine on hemodialysis days Monitor BP.   Phantom limb pain: Continue with Lyrica gabapentin.   Decubitus ulcer of sacral region stage III: Continue with wound care Hypothyroidism: Continue with Synthroid Diabetes type 2 with hyperlipidemia neuropathy: Continue with aspirin and statins Bacteremia: Patient has completed antibiotic therapy   PVD (peripheral vascular disease) ([place]) History of Bilateral AKA   Class 3 obesity (HCC) BMI >40    Chronic respiratory failure Due to morbid obesity  Constipation; Patient had BM after Suppository    Pressure Injury 11/12/21 Hip Right Unstageable - Full thickness tissue loss in which the base of the injury is covered by slough (yellow, tan, gray, green or brown) and/or eschar (tan, brown or black) in the wound bed. 90% covered with slough (Active)  11/12/21 0800  Location: Hip  Location Orientation: Right  Staging: Unstageable - Full thickness tissue loss in which the base of the injury is covered by slough (yellow, tan, gray, green or brown) and/or eschar (tan, brown or black) in the wound bed.  Wound Description (Comments): 90% covered with  slough  Present on Admission: Yes  Dressing Type Other (Comment) 04/22/22 1551     Pressure Injury 11/24/21 Flank Left;Lower Stage 2 -  Partial thickness loss of dermis presenting as a shallow open injury with a red, pink wound  bed without slough. (Active)  11/24/21 0430  Location: Flank  Location Orientation: Left;Lower  Staging: Stage 2 -  Partial thickness loss of dermis presenting as a shallow open injury with a red, pink wound bed without slough.  Wound Description (Comments):   Present on Admission: No  Dressing Type Foam - Lift dressing to assess site every shift 04/21/22 2130      Estimated body mass index is 43.62 kg/m as calculated from the following:   Height as of this encounter: 5\' 5"  (1.651 m).   Weight as of this encounter: 118.9 kg.   DVT prophylaxis: Aspirin Code Status: Full code Family Communication: Care discussed with  patient Disposition Plan:  Status is: Inpatient Remains inpatient appropriate because: Awaiting out patient HD set up, she needs to be able to sit for HD    Consultants:  Nephrology ID   Procedures:    Antimicrobials:    Subjective: She is having phantom pain.   Objective: Vitals:   04/22/22 1300 04/22/22 1315 04/22/22 1316 04/22/22 1551  BP: (!) 152/84 (!) 152/78 (!) 157/78 127/71  Pulse: 97 63 (!) 112 69  Resp: 17 17 (!) 21 18  Temp:  97.9 F (36.6 C)  (!) 97.5 F (36.4 C)  TempSrc:    Oral  SpO2:  99% 94% 100%  Weight:  118.9 kg    Height:        Intake/Output Summary (Last 24 hours) at 04/22/2022 1705 Last data filed at 04/22/2022 1315 Gross per 24 hour  Intake --  Output 2000 ml  Net -2000 ml    Filed Weights   04/19/22 1239 04/22/22 0856 04/22/22 1315  Weight: 116.6 kg 120.3 kg 118.9 kg    Examination:  General exam: NAD Respiratory system: CTA Cardiovascular system: S 1, S 2 RRR Gastrointestinal system: BS present, soft, nt Central nervous system: Alert, answer questions.  Extremities: BL AKA Skin: multiples wound with dressing.   Data Reviewed: I have personally reviewed following labs and imaging studies  CBC: Recent Labs  Lab 04/16/22 0640 04/19/22 0500  WBC 8.2 6.5  NEUTROABS  --  4.2  HGB 9.1* 8.2*  HCT  29.7* 26.6*  MCV 92.0 93.3  PLT 201 623    Basic Metabolic Panel: Recent Labs  Lab 04/16/22 0640 04/19/22 0800  NA 134* 137  K 5.0 5.0  CL 94* 95*  CO2 26 27  GLUCOSE 91 100*  BUN 35* 44*  CREATININE 6.85* 7.72*  CALCIUM 9.6 9.5  PHOS 5.0* 6.3*    GFR: Estimated Creatinine Clearance: 11.1 mL/min (A) (by C-G formula based on SCr of 7.72 mg/dL (H)). Liver Function Tests: Recent Labs  Lab 04/16/22 0640 04/19/22 0800  ALBUMIN 3.2* 3.2*    No results for input(s): "LIPASE", "AMYLASE" in the last 168 hours. No results for input(s): "AMMONIA" in the last 168 hours. Coagulation Profile: No results for input(s): "INR", "PROTIME" in the last 168 hours. Cardiac Enzymes: No results for input(s): "CKTOTAL", "CKMB", "CKMBINDEX", "TROPONINI" in the last 168 hours. BNP (last 3 results) No results for input(s): "PROBNP" in the last 8760 hours. HbA1C: No results for input(s): "HGBA1C" in the last 72 hours. CBG: No results for input(s): "GLUCAP" in the last 168 hours. Lipid Profile: No  results for input(s): "CHOL", "HDL", "LDLCALC", "TRIG", "CHOLHDL", "LDLDIRECT" in the last 72 hours. Thyroid Function Tests: No results for input(s): "TSH", "T4TOTAL", "FREET4", "T3FREE", "THYROIDAB" in the last 72 hours. Anemia Panel: No results for input(s): "VITAMINB12", "FOLATE", "FERRITIN", "TIBC", "IRON", "RETICCTPCT" in the last 72 hours. Sepsis Labs: No results for input(s): "PROCALCITON", "LATICACIDVEN" in the last 168 hours.  No results found for this or any previous visit (from the past 240 hour(s)).       Radiology Studies: No results found.      Scheduled Meds:  aspirin EC  81 mg Oral Daily   atorvastatin  10 mg Oral Daily   Chlorhexidine Gluconate Cloth  6 each Topical Q0600   darbepoetin (ARANESP) injection - DIALYSIS  200 mcg Subcutaneous Q Tue-1800   docusate sodium  100 mg Oral BID   famotidine  20 mg Oral Daily   feeding supplement  1 Container Oral TID BM    gabapentin  100 mg Oral QHS   Gerhardt's butt cream   Topical BID   heparin sodium (porcine)       levothyroxine  100 mcg Oral Q0600   midodrine  10 mg Oral Once per day on Tue Thu Sat   pantoprazole  40 mg Oral Daily   pregabalin  50 mg Oral Daily   sevelamer carbonate  4.8 g Oral TID WC   sucroferric oxyhydroxide  500 mg Oral TID WC   Continuous Infusions:  sodium thiosulfate 12.5 g in sodium chloride 0.9 % 150 mL Infusion for Calciphylaxis Stopped (04/22/22 1427)     LOS: 172 days    Time spent: 35 minutes    Earnestine Shipp A Mahdi Frye, MD [facility]   If 7PM-7AM, please contact night-coverage [email or web address]  04/22/2022, 5:05 PM

## 2022-04-22 NOTE — Progress Notes (Incomplete)
Received patient in bed before start of her hemodialysis treatment.Awake alert and oriented  x 4 .She has an oxygen at 2 lpm/[place],otherwise ,vitals were stable.  Access used: Left Hd catheter that works well ,dressing on date.  Duration of treatment :3.75 hours.  Medicine given : Heparin 2,300 units .                            Sodium thiosulfate 12.5 g.  Fluid removed:  Hemodialysis issue:  Hand off to the patient's nurse.

## 2022-04-22 NOTE — Progress Notes (Signed)
[facility] Progress Note   Subjective:    Seen in room. Completed dialysis today - net UF 2L. Getting dressings changed. No new concerns    Objective Vitals:   04/22/22 1232 04/22/22 1300 04/22/22 1315 04/22/22 1316  BP: (!) 144/117 (!) 152/84 (!) 152/78 (!) 157/78  Pulse: (!) 54 97 63 (!) 112  Resp: 15 17 17  (!) 21  Temp:   97.9 F (36.6 C)   TempSrc:      SpO2: 100%  99% 94%  Weight:   118.9 kg   Height:       Physical Exam General: Obese female,nad  Heart: RRR systolic murmur  Lungs: Clear bilaterally  Abdomen: soft and non-tender Extremities: No stump edema  Dialysis Access[facility]   Filed Weights   04/19/22 1239 04/22/22 0856 04/22/22 1315  Weight: 116.6 kg 120.3 kg 118.9 kg    Intake/Output Summary (Last 24 hours) at 04/22/2022 1517 Last data filed at 04/22/2022 1315 Gross per 24 hour  Intake --  Output 2000 ml  Net -2000 ml     Additional Objective Labs: Basic Metabolic Panel: Recent Labs  Lab 04/16/22 0640 04/19/22 0800  NA 134* 137  K 5.0 5.0  CL 94* 95*  CO2 26 27  GLUCOSE 91 100*  BUN 35* 44*  CREATININE 6.85* 7.72*  CALCIUM 9.6 9.5  PHOS 5.0* 6.3*    Liver Function Tests: Recent Labs  Lab 04/16/22 0640 04/19/22 0800  ALBUMIN 3.2* 3.2*    No results for input(s): "LIPASE", "AMYLASE" in the last 168 hours. CBC: Recent Labs  Lab 04/16/22 0640 04/19/22 0500  WBC 8.2 6.5  NEUTROABS  --  4.2  HGB 9.1* 8.2*  HCT 29.7* 26.6*  MCV 92.0 93.3  PLT 201 175    Blood Culture    Component Value Date/Time   SDES BLOOD LEFT ANTECUBITAL 10/16/2021 1534   SPECREQUEST  10/16/2021 1534    BOTTLES DRAWN AEROBIC AND ANAEROBIC Blood Culture results may not be optimal due to an inadequate volume of blood received in culture bottles   CULT  10/16/2021 1534    NO GROWTH 5 DAYS Performed at [facility], [place] [address] [place], [place] [address]    REPTSTATUS 10/21/2021 FINAL 10/16/2021 1534    Cardiac Enzymes: No  results for input(s): "CKTOTAL", "CKMB", "CKMBINDEX", "TROPONINI" in the last 168 hours. CBG: No results for input(s): "GLUCAP" in the last 168 hours. Iron Studies: No results for input(s): "IRON", "TIBC", "TRANSFERRIN", "FERRITIN" in the last 72 hours. Lab Results  Component Value Date   INR 1.3 (H) 10/09/2021   INR 1.3 (H) 09/10/2021   INR 1.3 (H) 07/10/2021   Studies/Results: No results found.  Medications:  sodium thiosulfate 12.5 g in sodium chloride 0.9 % 150 mL Infusion for Calciphylaxis Stopped (04/22/22 1427)    aspirin EC  81 mg Oral Daily   atorvastatin  10 mg Oral Daily   Chlorhexidine Gluconate Cloth  6 each Topical Q0600   darbepoetin (ARANESP) injection - DIALYSIS  200 mcg Subcutaneous Q Tue-1800   docusate sodium  100 mg Oral BID   famotidine  20 mg Oral Daily   feeding supplement  1 Container Oral TID BM   gabapentin  100 mg Oral QHS   Gerhardt's butt cream   Topical BID   heparin sodium (porcine)       levothyroxine  100 mcg Oral Q0600   midodrine  10 mg Oral Once per day on Tue Thu Sat  pantoprazole  40 mg Oral Daily   pregabalin  50 mg Oral Daily   sevelamer carbonate  4.8 g Oral TID WC   sucroferric oxyhydroxide  500 mg Oral TID WC    Assessment/Plan:   Debility: Has been unable to tolerate dialysis in recliner and therefore unable to discharge to outpatient dialysis. Per her report has lost >50 lbs during admission. Sacral decub ulcers remain a barrier. She now has new powerchair/cushion and has been sitting for longer periods.  Appreciate efforts from PT team.  ESRD: Was usual TTS schedule .  Refusing HD at times. Back on schedule. Next HD 1/18. HD access: new TDC placed 02/04/2022, TDC stopped working again 11/30. IR consulted - S/p venogram, angioplasty of SVC and innominate veins, and TDC exchange. No recent issues reported.  Painful nodular lesions in abdomen/possible calciphylaxis(no BX): Initially felt just d/t subq heparin injections, but then  worsening. Started empirically on sodium thiosulfate, but caused vomiting so stopped - now getting 1/2 dose - 12.5mg  q HD, refuses increased dose. Not on any calcium based products or vitamin D. HTN /volume: On midodrine 10mg  qHD, with 10mg  prn mid HD w/parameters to keep SBP >100 d/t becoming symptomatic when it drops lower. Significant weight loss this admit Severe ischial sacral decubitus wound: CT showing chronic osteomyelitis. Limiting her, noted to be slow healing. Goal to tolerate sitting upright - which she is doing with PT.  COPD/Chronic O2 use: Prior O2 at home (5L/min) -> tapered to 2-3L/min here Anemia of ESRD: Hgb 9.1 >8.2  on max dose Aranesp 262mcg q Tues. Recent Fe load completed.  Secondary hyperparathyroidism: On two binders. CorrCa elevated. Not on VDRA, last PTH 86 (low) on 03/11/22. Avoid all calcium based medications for Vitamin D d/t concern for calciphylaxis.  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 Nutrition -ALB 3.2 diet continues to be changed from renal.  Currently on regular diet.  Would really prefer she be on a renal diet but patient has been refusing Disposition: Difficult situation. Prior to admit, patient required 6 staff members and 2 hoyer pads for transfer into HD chair. Could not stay on HD for full tx. 2/2 pain of multiple decubitus ulcers and PTAR unable to lift pt into ambulance for transport.SW working on LTC at [employer]. Will need to tolerate 5-6hrs in a chair to tolerate OP dialysis including transport, treatment and cannulation time.  The main issue is her Rt ischial wound. She now has new power chair and cushion. She has been sitting in the chair for longer periods. Up to 4 hours past  week     [name] PA-C [facility] 04/22/2022,3:17 PM

## 2022-04-22 NOTE — Progress Notes (Signed)
PT Cancellation Note  Patient Details Name: Courtney Gray MRN: [id number] DOB: 25-Oct-1970   Cancelled Treatment:    Reason Eval/Treat Not Completed: Patient at procedure or test/unavailable Still in dialysis. Will check back this afternoon as schedule permits.  Ellouise Newer 04/22/2022, 1:04 PM

## 2022-04-22 NOTE — Progress Notes (Signed)
OT Cancellation Note  Patient Details Name: Courtney Gray MRN: [id number] DOB: [birth year]   Cancelled Treatment:    Reason Eval/Treat Not Completed: Patient at procedure or test/ unavailable Off unit for HD this AM. Will follow up as schedule permits.  Layla Maw 04/22/2022, 9:08 AM

## 2022-04-23 DIAGNOSIS — N186 End stage renal disease: Secondary | ICD-10-CM | POA: Diagnosis not present

## 2022-04-23 MED ORDER — HEPARIN SODIUM (PORCINE) 5000 UNIT/ML IJ SOLN
5000.0000 [IU] | Freq: Three times a day (TID) | INTRAMUSCULAR | Status: DC
  Filled 2022-04-23 (×5): qty 1

## 2022-04-23 NOTE — Progress Notes (Signed)
[facility] Progress Note   Subjective:    Seen in room. Not talking much - says her meds are kicking in. No new concerns. No issues with dialysis    Objective Vitals:   04/22/22 2200 04/23/22 0300 04/23/22 0500 04/23/22 0833  BP: (!) 152/70  (!) 154/99 (!) 107/96  Pulse: (!) 59  69 61  Resp: 18  20 19   Temp: 98.3 F (36.8 C)  98 F (36.7 C) 98 F (36.7 C)  TempSrc: Oral  Oral Oral  SpO2: 98% 93%  100%  Weight:      Height:       Physical Exam General: Obese female,nad  Heart: RRR systolic murmur  Lungs: Clear bilaterally  Abdomen: soft and non-tender Extremities: No stump edema  Dialysis Access: [facility]   Filed Weights   04/19/22 1239 04/22/22 0856 04/22/22 1315  Weight: 116.6 kg 120.3 kg 118.9 kg    Intake/Output Summary (Last 24 hours) at 04/23/2022 1322 Last data filed at 04/23/2022 0900 Gross per 24 hour  Intake 460 ml  Output --  Net 460 ml     Additional Objective Labs: Basic Metabolic Panel: Recent Labs  Lab 04/19/22 0800  NA 137  K 5.0  CL 95*  CO2 27  GLUCOSE 100*  BUN 44*  CREATININE 7.72*  CALCIUM 9.5  PHOS 6.3*    Liver Function Tests: Recent Labs  Lab 04/19/22 0800  ALBUMIN 3.2*    No results for input(s): "LIPASE", "AMYLASE" in the last 168 hours. CBC: Recent Labs  Lab 04/19/22 0500  WBC 6.5  NEUTROABS 4.2  HGB 8.2*  HCT 26.6*  MCV 93.3  PLT 175    Blood Culture    Component Value Date/Time   SDES BLOOD LEFT ANTECUBITAL 10/16/2021 1534   SPECREQUEST  10/16/2021 1534    BOTTLES DRAWN AEROBIC AND ANAEROBIC Blood Culture results may not be optimal due to an inadequate volume of blood received in culture bottles   CULT  10/16/2021 1534    NO GROWTH 5 DAYS Performed at [facility], [place] [address] [place],  [address]    REPTSTATUS 10/21/2021 FINAL 10/16/2021 1534    Cardiac Enzymes: No results for input(s): "CKTOTAL", "CKMB", "CKMBINDEX", "TROPONINI" in the last 168 hours. CBG: No  results for input(s): "GLUCAP" in the last 168 hours. Iron Studies: No results for input(s): "IRON", "TIBC", "TRANSFERRIN", "FERRITIN" in the last 72 hours. Lab Results  Component Value Date   INR 1.3 (H) 10/09/2021   INR 1.3 (H) 09/10/2021   INR 1.3 (H) 07/10/2021   Studies/Results: No results found.  Medications:  sodium thiosulfate 12.5 g in sodium chloride 0.9 % 150 mL Infusion for Calciphylaxis Stopped (04/22/22 1427)    aspirin EC  81 mg Oral Daily   atorvastatin  10 mg Oral Daily   Chlorhexidine Gluconate Cloth  6 each Topical Q0600   darbepoetin (ARANESP) injection - DIALYSIS  200 mcg Subcutaneous Q Tue-1800   docusate sodium  100 mg Oral BID   famotidine  20 mg Oral Daily   feeding supplement  1 Container Oral TID BM   gabapentin  100 mg Oral QHS   Gerhardt's butt cream   Topical BID   heparin injection (subcutaneous)  5,000 Units Subcutaneous Q8H   levothyroxine  100 mcg Oral Q0600   midodrine  10 mg Oral Once per day on Tue Thu Sat   pantoprazole  40 mg Oral Daily   pregabalin  50 mg Oral Daily  sevelamer carbonate  4.8 g Oral TID WC   sucroferric oxyhydroxide  500 mg Oral TID WC    Assessment/Plan:   Debility: Has been unable to tolerate dialysis in recliner and therefore unable to discharge to outpatient dialysis. Per her report has lost >50 lbs during admission. Sacral decub ulcers remain a barrier. She now has new powerchair/cushion and has been sitting for longer periods.  Appreciate efforts from PT team. Mood seems a little down this week.  ESRD: Was usual TTS schedule .  Refusing HD at times. Back on schedule. Next HD 1/18. Update labs.  HD access: new TDC placed 02/04/2022, TDC stopped working again 11/30. IR consulted - S/p venogram, angioplasty of SVC and innominate veins, and TDC exchange. No recent issues reported.  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 Started  empirically on sodium thiosulfate, but caused vomiting so stopped - now getting 1/2 dose - 12.5mg  q HD, refuses increased dose. Not on any calcium based products or vitamin D. HTN /volume: On midodrine 10mg  qHD, with 10mg  prn mid HD w/parameters to keep SBP >100 d/t becoming symptomatic when it drops lower. Significant weight loss this admit Severe ischial sacral decubitus wound: CT showing chronic osteomyelitis. Limiting her, noted to be slow healing. Goal to tolerate sitting upright - which she is doing with PT.  COPD/Chronic O2 use: Prior O2 at home (5L/min) -> tapered to 2-3L/min here Anemia of ESRD: Hgb 9.1 >8.2  on max dose Aranesp 272mcg q Tues. Recent Fe load completed.  Secondary hyperparathyroidism: On two binders. CorrCa elevated. Not on VDRA, last PTH 86 (low) on 03/11/22. Avoid all calcium based medications for Vitamin D d/t concern for calciphylaxis.  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 Nutrition -ALB 3.2 diet continues to be changed from renal.  Currently on regular diet.  Would really prefer she be on a renal diet but patient has been refusing Disposition: Difficult situation. Prior to admit, patient required 6 staff members and 2 hoyer pads for transfer into HD chair. Could not stay on HD for full tx. 2/2 pain of multiple decubitus ulcers and PTAR unable to lift pt into ambulance for transport.SW working on LTC at [employer]. Will need to tolerate 5-6hrs in a chair to tolerate OP dialysis including transport, treatment and cannulation time.  The main issue is her Rt ischial wound. She now has new power chair and cushion. She has been sitting in the chair for longer periods. Up to 4 hours past  week     [name] PA-C [facility] 04/23/2022,1:22 PM

## 2022-04-23 NOTE — Progress Notes (Signed)
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    PT Comments    Continuing work on functional mobility and activity tolerance;  Session focused on functional transfers with less dependence on bed pads (pt with underwear and gown on); able to direct this PT in the drive and use of the power chair, and needing less assist with backing into power chair;   Will need to consider working with mechanical lift that is analogous with the lift used at Outpt HD; requested the lift pad we use for [employer] here acutely; might be worth finding out the make and model of the lifts used at local Outpt HD centers   Recommendations for follow up therapy are one component of a multi-disciplinary discharge planning process, led by the attending physician.  Recommendations may be updated based on patient status, additional functional criteria and insurance authorization.  Follow Up Recommendations  Home health PT (consider PACE services vs SNF pending housing)     Assistance Recommended at Discharge Intermittent Supervision/Assistance  Patient can return home with the following A little help with walking and/or transfers;Help with stairs or ramp for entrance;Assist for transportation (medical [name]i transport)   Financial risk analyst (measurements PT);Wheelchair cushion (measurements PT);Other (comment);Hospital bed (more accomodating mechanical lift, with sling that handles pt's with bil AKAs;  cushion (20x24), power wheelchair with power tilt and recline)     Recommendations for Other Services       Precautions / Restrictions Precautions Precautions: Fall Precaution Comments: bil AKA, 2L O2 at baseline (has been tolerating mobility in power chair on RoomAir fine); when in power chair, MUST perform pressure relief every 30 minutes by tilting back for 10 minutes Restrictions Other Position/Activity Restrictions: when in power chair, MUST perform pressure relief every 30 minutes by tilting back for 10 minutes     Mobility  Bed Mobility Overal bed mobility: Modified Independent                  Transfers Overall transfer level: Needs assistance Equipment used: None (Pt had on underwear and a gown) Transfers: Bed to chair/wheelchair/BSC         Anterior-Posterior transfers: Min guard   General transfer comment: Opted to work without bedpad because pt had on underwear and  a gown on that would stay relative to her skin; close guard for safety and questioning cues to allow pt to problem-solve position relative to her power chair    Ambulation/Gait                   Stairs             Wheelchair Mobility    Modified Rankin (Stroke Patients Only)       Balance                                            Cognition Arousal/Alertness: Awake/alert Behavior During Therapy: WFL for tasks assessed/performed Overall Cognitive Status: Within Functional Limits for tasks assessed  General Comments: Discussed how grief for her LEs hits her unpredictably        Exercises      General Comments General comments (skin integrity, edema, etc.): Increased time before getting up as pt expressed that grief hits her at different times re: losing her legs; this PT fashoined bolsters with linens and tape to be like yoga blocks for chair push up exercise      Pertinent Vitals/Pain Pain Assessment Pain Assessment: Faces Faces Pain Scale: Hurts a little  bit Pain Location: R abdomen Pain Descriptors / Indicators: Grimacing, Discomfort Pain Intervention(s): Premedicated before session    Home Living                          Prior Function            PT Goals (current goals can now be found in the care plan section) Acute Rehab PT Goals Patient Stated Goal: Did not specifically state, but agrees to work on transfers PT Goal Formulation: With patient Time For Goal Achievement: 04/19/22 Potential to Achieve Goals: Good Progress towards PT goals: Progressing toward goals    Frequency    Min 3X/week      PT Plan Current plan remains appropriate (pt and her daughter must work on finding accessible housing)    Co-evaluation              AM-PAC PT "6 Clicks" Mobility   Outcome Measure  Help needed turning from your back to your side while in a flat bed without using bedrails?: None Help needed moving from lying on your back to sitting on the side of a flat bed without using bedrails?: None Help needed moving to and from a bed to a chair (including a wheelchair)?: A Little Help needed standing up from a chair using your arms (e.g., wheelchair or bedside chair)?: Total Help needed to walk in hospital room?: Total Help needed climbing 3-5 steps with a railing? : Total 6 Click Score: 14    End of Session   Activity Tolerance: Patient tolerated treatment well Patient left: Other (comment) (in power chair, mobilizing on 60M) Nurse Communication: Mobility status;Other (comment) (possible need for assist with transfer back to bed) PT Visit Diagnosis: Other abnormalities of gait and mobility (R26.89);Muscle weakness (generalized) (M62.81) Pain - Right/Left: Right Pain - part of body:  (R abdomen)     Time: 9326-7124 PT Time Calculation (min) (ACUTE ONLY): 53 min  Charges:  $Therapeutic Activity: 38-52 mins $Self Care/Home Management: [place] [facility] [place] 04/23/2022, 3:17 PM

## 2022-04-23 NOTE — Progress Notes (Signed)
PROGRESS NOTE                                                                                                                                                                                                             Patient Demographics:    Courtney Gray, is a 52 y.o. female, DOB - [birth year], QIO:[id number]  Outpatient Primary MD for the patient is Monico Blitz, MD    LOS - 35  Admit date - 11/01/2021    Chief Complaint  Patient presents with   Vascular Access Problem       Brief Narrative (HPI from H&P)   52 year old with past medical history significant for ESRD on hemodialysis TTS, recent hospitalization for bacteremia, hepatic and splenic abscess on long-term IV antibiotics, diastolic heart failure, PVD status post bilateral AKA, insulin-dependent diabetes type 2, morbid obesity and chronic sacral wound presents with progressive shortness of breath in the setting of missing hemodialysis and admitted for hypertensive emergency.  Patient with long length of stay, discharge pending due to ability to obtain outpatient hemodialysis that can accumulate patient's mobility constraints secondary to decubitus ulcer wound and bilateral AKA.  I assumed her care on 11/01/2021 on day 173 of her hospital stay.   Subjective:    Courtney Gray today has, No headache, No chest pain, No abdominal pain - No Nausea, No new weakness tingling or numbness, no SOB.   Assessment  & Plan :   End stage renal disease ([place]) - Renal following the patient currently on TTS schedule.  Awaiting outpatient CLIP.  Hypertension - blood pressure now running soft, patient on midodrine on HD to prevent hypotension, off BP Meds.   Decubitus ulcer of sacral region, stage 3 (HCC) - Deep wound at the sacrum, patient not able to seat.   Continue with oral oxycodone dose has been decreased to 2,5 mg, Continue with local wound care. Continue with gabapentin  &duloxetine for chronic pain syndrome.   Dyslipidemia.  On statin.  Bacteremia with septic shock earlier in admission - initially had septic shock along with bacteremia with Bacteroides and splenic abscess.  This problem has resolved she has finished her antibiotics, she also had abdominal drain which was removed on 11/13/2021.  PVD (peripheral vascular disease) (HCC) - Bilateral AKA.  Continue local wound care. Now patient has an Conservator, museum/gallery, patient interested in  getting bilateral prosthetic devices, unfortunately this will be done outpatient in the orthopedic office.  Class 3 obesity (HCC)  - Calculated BMI is 42.6  Acute on chronic diastolic CHF (congestive heart failure) (HCC) - Volume regulation with ultrafiltration in hemodialysis, continue midodrine for blood pressure support.   Nodule of skin of abdomen - seen, case was reviewed by previous MD Dr. 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  Continue to monitor.  Pre DM2 - diet controlled  Lab Results  Component Value Date   HGBA1C 5.3 12/19/2021         Condition - Fair  Family Communication  :  None present  Code Status :  Full  Consults  :  Renal, ID  PUD Prophylaxis : PPI   Procedures  :            Disposition Plan  :    Status is: Inpatient   DVT Prophylaxis  :   Heparin   Lab Results  Component Value Date   PLT 175 04/19/2022    Diet :  Diet Order             Diet regular Room service appropriate? Yes; Fluid consistency: Thin  Diet effective now                    Inpatient Medications  Scheduled Meds:  aspirin EC  81 mg Oral Daily   atorvastatin  10 mg Oral Daily   Chlorhexidine Gluconate Cloth  6 each Topical Q0600   darbepoetin (ARANESP) injection - DIALYSIS  200 mcg Subcutaneous Q Tue-1800   docusate sodium  100 mg Oral BID    famotidine  20 mg Oral Daily   feeding supplement  1 Container Oral TID BM   gabapentin  100 mg Oral QHS   Gerhardt's butt cream   Topical BID   heparin injection (subcutaneous)  5,000 Units Subcutaneous Q8H   levothyroxine  100 mcg Oral Q0600   midodrine  10 mg Oral Once per day on Tue Thu Sat   pantoprazole  40 mg Oral Daily   pregabalin  50 mg Oral Daily   sevelamer carbonate  4.8 g Oral TID WC   sucroferric oxyhydroxide  500 mg Oral TID WC   Continuous Infusions:  sodium thiosulfate 12.5 g in sodium chloride 0.9 % 150 mL Infusion for Calciphylaxis Stopped (04/22/22 1427)   PRN Meds:.acetaminophen, bisacodyl, iohexol, lactulose, loperamide, loratadine, ondansetron, mouth rinse, oxyCODONE, polyethylene glycol, simethicone   Objective:   Vitals:   04/22/22 2200 04/23/22 0300 04/23/22 0500 04/23/22 0833  BP: (!) 152/70  (!) 154/99 (!) 107/96  Pulse: (!) 59  69 61  Resp: 18  20 19   Temp: 98.3 F (36.8 C)  98 F (36.7 C) 98 F (36.7 C)  TempSrc: Oral  Oral Oral  SpO2: 98% 93%  100%  Weight:      Height:        Wt Readings from Last 3 Encounters:  04/22/22 118.9 kg  10/21/21 (!) 144 kg  09/03/21 (!) 150.6 kg     Intake/Output Summary (Last 24 hours) at 04/23/2022 0953 Last data filed at 04/23/2022 0900 Gross per 24 hour  Intake 460 ml  Output 2000 ml  Net -1540 ml     Physical Exam  Awake Alert, No new F.N deficits, Normal affect [place].AT,PERRAL Supple Neck,  No JVD,   Symmetrical Chest wall movement, Good air movement bilaterally, CTAB RRR,No Gallops,Rubs or new Murmurs,  +ve B.Sounds, Abd Soft, No tenderness,   Bilat AKA, Abd calciphylaxis wounds stable    RN pressure injury documentation: Pressure Injury 11/12/21 Hip Right Unstageable - Full thickness tissue loss in which the base of the injury is covered by slough (yellow, tan, gray, green or brown) and/or eschar (tan, brown or black) in the wound bed. 90% covered with slough (Active)  11/12/21 0800   Location: Hip  Location Orientation: Right  Staging: Unstageable - Full thickness tissue loss in which the base of the injury is covered by slough (yellow, tan, gray, green or brown) and/or eschar (tan, brown or black) in the wound bed.  Wound Description (Comments): 90% covered with slough  Present on Admission: Yes (per RN)  Dressing Type Bismuth petroleum;Foam - Lift dressing to assess site every shift 04/22/22 2300     Pressure Injury 11/24/21 Flank Left;Lower Stage 2 -  Partial thickness loss of dermis presenting as a shallow open injury with a red, pink wound bed without slough. (Active)  11/24/21 0430  Location: Flank  Location Orientation: Left;Lower  Staging: Stage 2 -  Partial thickness loss of dermis presenting as a shallow open injury with a red, pink wound bed without slough.  Wound Description (Comments):   Present on Admission: No  Dressing Type Foam - Lift dressing to assess site every shift 04/22/22 2300      Data Review:    Recent Labs  Lab 04/19/22 0500  WBC 6.5  HGB 8.2*  HCT 26.6*  PLT 175  MCV 93.3  MCH 28.8  MCHC 30.8  RDW 17.2*  LYMPHSABS 1.3  MONOABS 0.7  EOSABS 0.3  BASOSABS 0.0    Recent Labs  Lab 04/19/22 0800  NA 137  K 5.0  CL 95*  CO2 27  ANIONGAP 15  GLUCOSE 100*  BUN 44*  CREATININE 7.72*  ALBUMIN 3.2*  CALCIUM 9.5       Signature  -   Lala Lund M.D on 04/23/2022 at 9:53 AM   -  To page go to [email or web address]

## 2022-04-23 NOTE — Progress Notes (Signed)
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   OT comments  Focus on LB dressing bed level w/ compensatory strategies to progress ADL independence and identify optimal clothing to prevent shearing of skin w/ transfers. Pt fatigued with task and required rest breaks but able to complete bed mobility Modified Independent and LB dressing w/ Mod A. Reduced frequency to 1x/wk due to extended session times often needed and to continue monitoring acutely to progress safe DC to least restrictive environment.    Recommendations for follow up therapy are one component of a multi-disciplinary discharge planning process, led by the attending physician.  Recommendations may be updated based on patient status, additional functional criteria and insurance authorization.    Follow Up Recommendations  Skilled nursing-short term rehab (<3 hours/day) (though pt declining SNF; would benefit from [facility] follow up if opting to DC home pending ability to obtain housing)     Assistance Recommended at Discharge Intermittent Supervision/Assistance  Patient can return home with the following  A little help with walking and/or transfers;A little help with bathing/dressing/bathroom   Equipment Recommendations  Other (comment);Hospital bed (power chair; drop arm BSC)    Recommendations for Other Services      Precautions / Restrictions Precautions Precautions: Fall Precaution Comments: bil AKA, 2L O2 at baseline (has been tolerating mobility in power chair on RoomAir fine); when in power  chair, MUST perform pressure relief every 30 minutes by tilting back for 10 minutes Restrictions Weight Bearing Restrictions: Yes RLE Weight Bearing: Non weight bearing LLE Weight Bearing: Non weight bearing       Mobility Bed Mobility Overal bed mobility: Modified Independent   Rolling: Modified independent (Device/Increase time)              Transfers                         Balance                                           ADL either performed or assessed with clinical judgement   ADL Overall ADL's : Needs assistance/impaired     Grooming: Set up;Sitting;Oral care;Applying deodorant               Lower Body Dressing: Moderate assistance;Bed level Lower Body Dressing Details (indicate cue type and reason): focus on problem solving clothing for transfers to avoid shearing. Attempted XL pull up briefs though too small so switched to pt's regular underwear with good donning over residual limbs, assist to pull over waist in rolling due to fatigue w/ pt able to reach back to assist.                    Extremity/Trunk Assessment Upper Extremity Assessment Upper Extremity Assessment: Overall WFL for tasks assessed   Lower Extremity Assessment Lower Extremity Assessment: Defer to PT evaluation        Vision   Vision Assessment?: No apparent visual deficits  Perception     Praxis      Cognition Arousal/Alertness: Awake/alert Behavior During Therapy: WFL for tasks assessed/performed Overall Cognitive Status: Within Functional Limits for tasks assessed                                          Exercises      Shoulder Instructions       General Comments      Pertinent Vitals/ Pain       Pain Assessment Pain Assessment: Faces Faces Pain Scale: No hurt Pain Intervention(s): Monitored during session  Home Living                                          Prior  Functioning/Environment              Frequency  Min 2X/week        Progress Toward Goals  OT Goals(current goals can now be found in the care plan section)  Progress towards OT goals: Progressing toward goals  Acute Rehab OT Goals Patient Stated Goal: be able to get dressed independently OT Goal Formulation: With patient Time For Goal Achievement: 04/30/22 Potential to Achieve Goals: Good  Plan Discharge plan remains appropriate;Frequency needs to be updated    Co-evaluation                 AM-PAC OT "6 Clicks" Daily Activity     Outcome Measure   Help from another person eating meals?: None Help from another person taking care of personal grooming?: A Little Help from another person toileting, which includes using toliet, bedpan, or urinal?: A Lot Help from another person bathing (including washing, rinsing, drying)?: A Lot Help from another person to put on and taking off regular upper body clothing?: A Little Help from another person to put on and taking off regular lower body clothing?: A Lot 6 Click Score: 16    End of Session Equipment Utilized During Treatment: Oxygen  OT Visit Diagnosis: Muscle weakness (generalized) (M62.81);History of falling (Z91.81) Pain - Right/Left: Right Pain - part of body: Leg   Activity Tolerance Patient tolerated treatment well   Patient Left in bed;with call bell/phone within reach   Nurse Communication          Time: 7628-3151 OT Time Calculation (min): 40 min  Charges: OT General Charges $OT Visit: 1 Visit OT Treatments $Self Care/Home Management : 23-37 mins $Therapeutic Activity: 8-22 mins  Malachy Chamber, OTR/L Acute Rehab Services Office: [phone]   Layla Maw 04/23/2022, 10:49 AM

## 2022-04-23 NOTE — Progress Notes (Signed)
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    PT Comments    Continuing work on functional mobility and activity tolerance;  quick session focused on transfer training from power chair back to bed using anterior-posterior method with success; no physical assist needed;   Will plan to spread the word among floor staff that Gilberte is requiring less and less heavy assist, and can get up to the power chair with min assist of one -- use the bed pad for scooting if she does not have underwear and gown on;   Pt asked about getting fitted for prostheses; Explained that prostheses are fitted on an outpt basis   Recommendations for follow up therapy are one component of a multi-disciplinary discharge planning process, led by the attending physician.  Recommendations may be updated based on patient status, additional functional criteria and insurance authorization.  Follow Up Recommendations  Home health PT (consider PACE services vs SNF pending housing)     Assistance Recommended at Discharge Intermittent Supervision/Assistance  Patient can return home with the following A little help with walking and/or transfers;Help with stairs or ramp for entrance;Assist for transportation (medical [name] transport)   Financial risk analyst (measurements PT);Wheelchair cushion (measurements PT);Other (comment);Hospital bed (more accomodating mechanical lift, with sling that handles pt's with bil AKAs;  cushion (20x24), power wheelchair with power tilt and recline)     Recommendations for Other Services       Precautions / Restrictions Precautions Precautions: Fall Precaution Comments: bil AKA, 2L O2 at baseline (has been tolerating mobility in power chair on [employer] fine); when in power chair, MUST perform pressure relief every 30 minutes by tilting back for 10 minutes Restrictions Other Position/Activity Restrictions: when in power chair, MUST perform pressure relief every 30 minutes by tilting back for 10 minutes     Mobility  Bed Mobility Overal bed mobility: Modified Independent             General bed mobility comments: using far bedrails in semi-sidelie/prone position to pull self fully onto bed    Transfers Overall transfer level: Needs assistance Equipment used: None (Pt had on underwear and a gown) Transfers: Bed to chair/wheelchair/BSC         Anterior-Posterior transfers: Min guard   General transfer comment: reciprocally scooted hips forward from power chair (seat angle inclined forward) to bed; close guard for safety, however pt needed no physical assist    Ambulation/Gait                   Stairs             Wheelchair Mobility    Modified Rankin (Stroke Patients Only)       Balance                                            Cognition Arousal/Alertness: Awake/alert Behavior During Therapy: WFL for tasks assessed/performed Overall Cognitive Status: Within Functional Limits for tasks assessed  General Comments: Discussed how grief for her LEs hits her unpredictably        Exercises      General Comments General comments (skin integrity, edema, etc.): Increased time before getting up as pt expressed that grief hits her at different times re: losing her legs; this PT fashoined bolsters with linens and tape to be like yoga blocks for chair push up exercise      Pertinent Vitals/Pain Pain Assessment Pain Assessment:  Faces Faces Pain Scale: No hurt Pain Location: R abdomen Pain Descriptors / Indicators: Grimacing, Discomfort Pain Intervention(s): Monitored during session    Home Living                          Prior Function            PT Goals (current goals can now be found in the care plan section) Acute Rehab PT Goals Patient Stated Goal: Did not specifically state, but agrees to work on transfers PT Goal Formulation: With patient Time For Goal Achievement: 04/19/22 Potential to Achieve Goals: Good Progress towards PT goals: Progressing toward goals    Frequency    Min 3X/week      PT Plan Current plan remains appropriate (pt and her daughter must work on finding accessible housing)    Co-evaluation              AM-PAC PT "6 Clicks" Mobility   Outcome Measure  Help needed turning from your back to your side while in a flat bed without using bedrails?: None Help needed moving from lying on your back to sitting on the side of a flat bed without using bedrails?: None Help needed moving to and from a bed to a chair (including a wheelchair)?: A Little Help needed standing up from a chair using your arms (e.g., wheelchair or bedside chair)?: Total Help needed to walk in hospital room?: Total Help needed climbing 3-5 steps with a railing? : Total 6 Click Score: 14    End of Session   Activity Tolerance: Patient tolerated treatment well Patient left: in bed;with call bell/phone within reach;with nursing/sitter in room Nurse Communication: Mobility status;Other (comment) (possible need for assist with transfer back to bed) PT Visit Diagnosis: Other abnormalities of gait and mobility (R26.89);Muscle weakness (generalized) (M62.81) Pain - Right/Left: Right Pain - part of body:  (R abdomen)     Time: 7893-8101 PT Time Calculation (min) (ACUTE ONLY): 9 min  Charges:  $Therapeutic Activity: 8-22 mins $Self Care/Home Management: [place] [facility] [place] 04/23/2022, 4:15 PM

## 2022-04-24 DIAGNOSIS — N186 End stage renal disease: Secondary | ICD-10-CM | POA: Diagnosis not present

## 2022-04-24 LAB — CBC WITH DIFFERENTIAL/PLATELET
Abs Immature Granulocytes: 0.03 10*3/uL (ref 0.00–0.07)
Basophils Absolute: 0.1 10*3/uL (ref 0.0–0.1)
Basophils Relative: 1 %
Eosinophils Absolute: 0.4 10*3/uL (ref 0.0–0.5)
Eosinophils Relative: 6 %
HCT: 28 % — ABNORMAL LOW (ref 36.0–46.0)
Hemoglobin: 8.5 g/dL — ABNORMAL LOW (ref 12.0–15.0)
Immature Granulocytes: 0 %
Lymphocytes Relative: 21 %
Lymphs Abs: 1.4 10*3/uL (ref 0.7–4.0)
MCH: 28.6 pg (ref 26.0–34.0)
MCHC: 30.4 g/dL (ref 30.0–36.0)
MCV: 94.3 fL (ref 80.0–100.0)
Monocytes Absolute: 0.7 10*3/uL (ref 0.1–1.0)
Monocytes Relative: 10 %
Neutro Abs: 4.1 10*3/uL (ref 1.7–7.7)
Neutrophils Relative %: 62 %
Platelets: 179 10*3/uL (ref 150–400)
RBC: 2.97 MIL/uL — ABNORMAL LOW (ref 3.87–5.11)
RDW: 17 % — ABNORMAL HIGH (ref 11.5–15.5)
WBC: 6.7 10*3/uL (ref 4.0–10.5)
nRBC: 0 % (ref 0.0–0.2)

## 2022-04-24 LAB — RENAL FUNCTION PANEL
Albumin: 3.3 g/dL — ABNORMAL LOW (ref 3.5–5.0)
Anion gap: 13 (ref 5–15)
BUN: 59 mg/dL — ABNORMAL HIGH (ref 6–20)
CO2: 26 mmol/L (ref 22–32)
Calcium: 9.3 mg/dL (ref 8.9–10.3)
Chloride: 99 mmol/L (ref 98–111)
Creatinine, Ser: 9.29 mg/dL — ABNORMAL HIGH (ref 0.44–1.00)
GFR, Estimated: 5 mL/min — ABNORMAL LOW (ref >60)
Glucose, Bld: 113 mg/dL — ABNORMAL HIGH (ref 70–99)
Phosphorus: 6 mg/dL — ABNORMAL HIGH (ref 2.5–4.6)
Potassium: 5.8 mmol/L — ABNORMAL HIGH (ref 3.5–5.1)
Sodium: 138 mmol/L (ref 135–145)

## 2022-04-24 MED ORDER — HEPARIN SODIUM (PORCINE) 1000 UNIT/ML IJ SOLN
INTRAMUSCULAR | Status: AC
  Administered 2022-04-24: 2500 [IU]
  Filled 2022-04-24: qty 3

## 2022-04-24 MED ORDER — ALTEPLASE 2 MG IJ SOLR: 2.0000 mg | Freq: Once | INTRAMUSCULAR | Status: DC | PRN

## 2022-04-24 MED ORDER — LIDOCAINE HCL (PF) 1 % IJ SOLN: 5.0000 mL | INTRAMUSCULAR | Status: DC | PRN

## 2022-04-24 MED ORDER — HEPARIN SODIUM (PORCINE) 1000 UNIT/ML DIALYSIS: 1000.0000 [IU] | INTRAMUSCULAR | Status: DC | PRN

## 2022-04-24 MED ORDER — ANTICOAGULANT SODIUM CITRATE 4% (200MG/5ML) IV SOLN: 5.0000 mL | Status: DC | PRN

## 2022-04-24 MED ORDER — HEPARIN SODIUM (PORCINE) 1000 UNIT/ML IJ SOLN
INTRAMUSCULAR | Status: AC
  Filled 2022-04-24: qty 3

## 2022-04-24 MED ORDER — LIDOCAINE-PRILOCAINE 2.5-2.5 % EX CREA: 1.0000 | TOPICAL_CREAM | CUTANEOUS | Status: DC | PRN

## 2022-04-24 MED ORDER — HEPARIN SODIUM (PORCINE) 1000 UNIT/ML DIALYSIS
20.0000 [IU]/kg | INTRAMUSCULAR | Status: DC | PRN
  Administered 2022-04-24: 2400 [IU] via INTRAVENOUS_CENTRAL

## 2022-04-24 MED ORDER — PENTAFLUOROPROP-TETRAFLUOROETH EX AERO: 1.0000 | INHALATION_SPRAY | CUTANEOUS | Status: DC | PRN

## 2022-04-24 NOTE — Progress Notes (Signed)
PROGRESS NOTE                                                                                                                                                                                                             Patient Demographics:    Courtney Gray, is a 52 y.o. female, DOB - 03-24-1971, RAQ:[id number]  Outpatient Primary MD for the patient is Monico Blitz, MD    LOS - 62  Admit date - 11/01/2021    Chief Complaint  Patient presents with   Vascular Access Problem       Brief Narrative (HPI from H&P)   52 year old with past medical history significant for ESRD on hemodialysis TTS, recent hospitalization for bacteremia, hepatic and splenic abscess on long-term IV antibiotics, diastolic heart failure, PVD status post bilateral AKA, insulin-dependent diabetes type 2, morbid obesity and chronic sacral wound presents with progressive shortness of breath in the setting of missing hemodialysis and admitted for hypertensive emergency.  Patient with long length of stay, discharge pending due to ability to obtain outpatient hemodialysis that can accumulate patient's mobility constraints secondary to decubitus ulcer wound and bilateral AKA.  I assumed her care on 11/01/2021 on day 173 of her hospital stay.   Subjective:   Patient in bed, appears comfortable, denies any headache, no fever, no chest pain or pressure, no shortness of breath , no abdominal pain. No new focal weakness.   Assessment  & Plan :   End stage renal disease ([place]) - Renal following the patient currently on TTS schedule.  Awaiting outpatient CLIP.  Hypertension - blood pressure now running soft, patient on midodrine on HD to prevent hypotension, off BP Meds.   Decubitus ulcer of sacral region, stage 3 (HCC) - Deep wound at the sacrum, patient not able to seat.   Continue with oral oxycodone dose has been decreased to 2,5 mg, Continue with local wound care.  Continue with gabapentin &duloxetine for chronic pain syndrome.   Dyslipidemia.  On statin.  Bacteremia with septic shock earlier in admission - initially had septic shock along with bacteremia with Bacteroides and splenic abscess.  This problem has resolved she has finished her antibiotics, she also had abdominal drain which was removed on 11/13/2021.  PVD (peripheral vascular disease) (HCC) - Bilateral AKA.  Continue local wound care. Now patient has an Conservator, museum/gallery,  patient interested in getting bilateral prosthetic devices, unfortunately this will be done outpatient in the orthopedic office.  Class 3 obesity (HCC)  - Calculated BMI is 42.6  Acute on chronic diastolic CHF (congestive heart failure) (HCC) - Volume regulation with ultrafiltration in hemodialysis, continue midodrine for blood pressure support.   Nodule of skin of abdomen - seen, case was reviewed by previous MD Dr. 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  Continue to monitor.  Pre DM2 - diet controlled  Lab Results  Component Value Date   HGBA1C 5.3 12/19/2021         Condition - Fair  Family Communication  :  None present  Code Status :  Full  Consults  :  Renal, ID  PUD Prophylaxis : PPI   Procedures  :            Disposition Plan  :    Status is: Inpatient   DVT Prophylaxis  :   Heparin   Lab Results  Component Value Date   PLT 175 04/19/2022    Diet :  Diet Order             Diet regular Room service appropriate? Yes; Fluid consistency: Thin  Diet effective now                    Inpatient Medications  Scheduled Meds:  aspirin EC  81 mg Oral Daily   atorvastatin  10 mg Oral Daily   Chlorhexidine Gluconate Cloth  6 each Topical Q0600   darbepoetin (ARANESP) injection - DIALYSIS  200 mcg Subcutaneous Q Tue-1800   docusate  sodium  100 mg Oral BID   famotidine  20 mg Oral Daily   feeding supplement  1 Container Oral TID BM   gabapentin  100 mg Oral QHS   Gerhardt's butt cream   Topical BID   heparin injection (subcutaneous)  5,000 Units Subcutaneous Q8H   levothyroxine  100 mcg Oral Q0600   midodrine  10 mg Oral Once per day on Tue Thu Sat   pantoprazole  40 mg Oral Daily   pregabalin  50 mg Oral Daily   sevelamer carbonate  4.8 g Oral TID WC   sucroferric oxyhydroxide  500 mg Oral TID WC   Continuous Infusions:  sodium thiosulfate 12.5 g in sodium chloride 0.9 % 150 mL Infusion for Calciphylaxis Stopped (04/22/22 1427)   PRN Meds:.acetaminophen, bisacodyl, iohexol, lactulose, loperamide, loratadine, ondansetron, mouth rinse, oxyCODONE, polyethylene glycol, simethicone   Objective:   Vitals:   04/23/22 0833 04/23/22 1739 04/23/22 2212 04/24/22 0522  BP: (!) 107/96 (!) 149/69 (!) 142/71 118/71  Pulse: 61 63 65 63  Resp: 19 16 20 17   Temp: 98 F (36.7 C) 98.5 F (36.9 C) 98 F (36.7 C) 99.3 F (37.4 C)  TempSrc: Oral Oral Oral Oral  SpO2: 100% 100% 100% 100%  Weight:    120.8 kg  Height:        Wt Readings from Last 3 Encounters:  04/24/22 120.8 kg  10/21/21 (!) 144 kg  09/03/21 (!) 150.6 kg     Intake/Output Summary (Last 24 hours) at 04/24/2022 0925 Last data filed at 04/24/2022 0200 Gross per 24 hour  Intake 720 ml  Output --  Net 720 ml     Physical Exam  Awake Alert, No new F.N deficits,  Normal affect [place].AT,PERRAL Supple Neck, No JVD,   Symmetrical Chest wall movement, Good air movement bilaterally, CTAB RRR,No Gallops,Rubs or new Murmurs,  +ve B.Sounds, Abd Soft, No tenderness,   Bilat AKA, Abd calciphylaxis wounds stable    RN pressure injury documentation: Pressure Injury 11/12/21 Hip Right Unstageable - Full thickness tissue loss in which the base of the injury is covered by slough (yellow, tan, gray, green or brown) and/or eschar (tan, brown or black) in the wound  bed. 90% covered with slough (Active)  11/12/21 0800  Location: Hip  Location Orientation: Right  Staging: Unstageable - Full thickness tissue loss in which the base of the injury is covered by slough (yellow, tan, gray, green or brown) and/or eschar (tan, brown or black) in the wound bed.  Wound Description (Comments): 90% covered with slough  Present on Admission: Yes (per RN)  Dressing Type Foam - Lift dressing to assess site every shift 04/23/22 2200     Pressure Injury 11/24/21 Flank Left;Lower Stage 2 -  Partial thickness loss of dermis presenting as a shallow open injury with a red, pink wound bed without slough. (Active)  11/24/21 0430  Location: Flank  Location Orientation: Left;Lower  Staging: Stage 2 -  Partial thickness loss of dermis presenting as a shallow open injury with a red, pink wound bed without slough.  Wound Description (Comments):   Present on Admission: No  Dressing Type Foam - Lift dressing to assess site every shift 04/24/22 0538      Data Review:    Recent Labs  Lab 04/19/22 0500  WBC 6.5  HGB 8.2*  HCT 26.6*  PLT 175  MCV 93.3  MCH 28.8  MCHC 30.8  RDW 17.2*  LYMPHSABS 1.3  MONOABS 0.7  EOSABS 0.3  BASOSABS 0.0    Recent Labs  Lab 04/19/22 0800  NA 137  K 5.0  CL 95*  CO2 27  ANIONGAP 15  GLUCOSE 100*  BUN 44*  CREATININE 7.72*  ALBUMIN 3.2*  CALCIUM 9.5       Signature  -   Lala Lund M.D on 04/24/2022 at 9:25 AM   -  To page go to [email or web address]

## 2022-04-24 NOTE — Progress Notes (Signed)
PT off of floor to dialysis. Transported by transporter. Continued on 3 L.min [place]. Sent Sodium Thiosulfate IV bag inside of chart which was under head of bead. PT took cell phone, Games developer and two books with her. Report was given to staff at dialysis unit.

## 2022-04-24 NOTE — Progress Notes (Signed)
[facility] Progress Note   Subjective:    Seen in room.  Sitting up in bed -- No concerns today, in a good mood. For dialysis today   Objective Vitals:   04/23/22 1739 04/23/22 2212 04/24/22 0522 04/24/22 1010  BP: (!) 149/69 (!) 142/71 118/71 (!) 141/84  Pulse: 63 65 63 (!) 59  Resp: 16 20 17 16   Temp: 98.5 F (36.9 C) 98 F (36.7 C) 99.3 F (37.4 C) 99.1 F (37.3 C)  TempSrc: Oral Oral Oral Oral  SpO2: 100% 100% 100% 100%  Weight:   120.8 kg   Height:       Physical Exam General: Obese female,nad  Heart: RRR systolic murmur  Lungs: Clear bilaterally  Abdomen: soft and non-tender Extremities: No stump edema  Dialysis Access: [facility]   Filed Weights   04/22/22 0856 04/22/22 1315 04/24/22 0522  Weight: 120.3 kg 118.9 kg 120.8 kg    Intake/Output Summary (Last 24 hours) at 04/24/2022 1427 Last data filed at 04/24/2022 0900 Gross per 24 hour  Intake 680 ml  Output --  Net 680 ml     Additional Objective Labs: Basic Metabolic Panel: Recent Labs  Lab 04/19/22 0800  NA 137  K 5.0  CL 95*  CO2 27  GLUCOSE 100*  BUN 44*  CREATININE 7.72*  CALCIUM 9.5  PHOS 6.3*    Liver Function Tests: Recent Labs  Lab 04/19/22 0800  ALBUMIN 3.2*    No results for input(s): "LIPASE", "AMYLASE" in the last 168 hours. CBC: Recent Labs  Lab 04/19/22 0500  WBC 6.5  NEUTROABS 4.2  HGB 8.2*  HCT 26.6*  MCV 93.3  PLT 175    Blood Culture    Component Value Date/Time   SDES BLOOD LEFT ANTECUBITAL 10/16/2021 1534   SPECREQUEST  10/16/2021 1534    BOTTLES DRAWN AEROBIC AND ANAEROBIC Blood Culture results may not be optimal due to an inadequate volume of blood received in culture bottles   CULT  10/16/2021 1534    NO GROWTH 5 DAYS Performed at [facility], [place] [address], [place], [place] [address]    REPTSTATUS 10/21/2021 FINAL 10/16/2021 1534    Cardiac Enzymes: No results for input(s): "CKTOTAL", "CKMB", "CKMBINDEX", "TROPONINI" in the  last 168 hours. CBG: No results for input(s): "GLUCAP" in the last 168 hours. Iron Studies: No results for input(s): "IRON", "TIBC", "TRANSFERRIN", "FERRITIN" in the last 72 hours. Lab Results  Component Value Date   INR 1.3 (H) 10/09/2021   INR 1.3 (H) 09/10/2021   INR 1.3 (H) 07/10/2021   Studies/Results: No results found.  Medications:  sodium thiosulfate 12.5 g in sodium chloride 0.9 % 150 mL Infusion for Calciphylaxis Stopped (04/22/22 1427)    aspirin EC  81 mg Oral Daily   atorvastatin  10 mg Oral Daily   darbepoetin (ARANESP) injection - DIALYSIS  200 mcg Subcutaneous Q Tue-1800   docusate sodium  100 mg Oral BID   famotidine  20 mg Oral Daily   feeding supplement  1 Container Oral TID BM   gabapentin  100 mg Oral QHS   Gerhardt's butt cream   Topical BID   heparin injection (subcutaneous)  5,000 Units Subcutaneous Q8H   levothyroxine  100 mcg Oral Q0600   midodrine  10 mg Oral Once per day on Tue Thu Sat   pantoprazole  40 mg Oral Daily   pregabalin  50 mg Oral Daily   sevelamer carbonate  4.8 g Oral TID WC  sucroferric oxyhydroxide  500 mg Oral TID WC    Assessment/Plan:   Debility: Has been unable to tolerate dialysis in recliner and therefore unable to discharge to outpatient dialysis. Per her report has lost >50 lbs during admission. Sacral decub ulcers remain a barrier. She now has new powerchair/cushion and has been sitting for longer periods.  Appreciate efforts from PT team. Mood seems a little down this week.  ESRD: Was usual TTS schedule .  Refusing HD at times. Back on schedule. Next HD 1/18. Update labs.  HD access: new TDC placed 02/04/2022, TDC stopped working again 11/30. IR consulted - S/p venogram, angioplasty of SVC and innominate veins, and TDC exchange. No recent issues reported.  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 Started empirically on sodium  thiosulfate, but caused vomiting so stopped - now getting 1/2 dose - 12.5mg  q HD, refuses increased dose. Not on any calcium based products or vitamin D. HTN /volume: On midodrine 10mg  qHD, with 10mg  prn mid HD w/parameters to keep SBP >100 d/t becoming symptomatic when it drops lower. Significant weight loss this admit Severe ischial sacral decubitus wound: CT showing chronic osteomyelitis. Limiting her, noted to be slow healing. Goal to tolerate sitting upright - which she is doing with PT.  COPD/Chronic O2 use: Prior O2 at home (5L/min) -> tapered to 2-3L/min here Anemia of ESRD: Hgb 9.1 >8.2  on max dose Aranesp 286mcg q Tues. Recent Fe load completed.  Secondary hyperparathyroidism: On two binders. CorrCa elevated. Not on VDRA, last PTH 86 (low) on 03/11/22. Avoid all calcium based medications for Vitamin D d/t concern for calciphylaxis.  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 Nutrition -ALB 3.2 diet continues to be changed from renal.  Currently on regular diet.  Would really prefer she be on a renal diet but patient has been refusing Disposition: Difficult situation. Prior to admit, patient required 6 staff members and 2 hoyer pads for transfer into HD chair. Could not stay on HD for full tx. 2/2 pain of multiple decubitus ulcers and PTAR unable to lift pt into ambulance for transport.SW working on LTC at [employer]. Will need to tolerate 5-6hrs in a chair to tolerate OP dialysis including transport, treatment and cannulation time.  The main issue is her Rt ischial wound. She now has new power chair and cushion. She has been sitting in the chair for longer periods. Up to 4 hours past  week.      [name] PA-C [facility] 04/24/2022,2:27 PM

## 2022-04-25 DIAGNOSIS — N186 End stage renal disease: Secondary | ICD-10-CM | POA: Diagnosis not present

## 2022-04-25 MED ORDER — FENTANYL 25 MCG/HR TD PT72
1.0000 | MEDICATED_PATCH | TRANSDERMAL | Status: DC
  Administered 2022-04-25 – 2022-04-28 (×2): 1 via TRANSDERMAL
  Filled 2022-04-25 (×2): qty 1

## 2022-04-25 NOTE — Progress Notes (Signed)
PT Cancellation Note  Patient Details Name: Courtney Gray MRN: [id number] DOB: 1970-05-24   Cancelled Treatment:    Reason Eval/Treat Not Completed: Patient declined, no reason specified Regretfully declines PT today. States her flank wounds have been extremely painful and has been placed on a fentanyl patch. Encouraged, per usual, to request supervision from staff to get OOB over the weekend and spend time in power wheelchair, getting used to being upright and sticking to reclining schedule as prescribed.  Ellouise Newer 04/25/2022, 3:58 PM

## 2022-04-25 NOTE — Progress Notes (Signed)
[facility] Progress Note   Subjective:   Patient seen and examined at bedside.  In good spirits.  Sitting up in bed.  Reports pain on R side from wounds.  Denies CP, SOB and n/v/d.  Objective Vitals:   04/24/22 1935 04/24/22 2111 04/25/22 0540 04/25/22 0939  BP: 135/68 (!) 147/74 (!) 158/83 (!) 156/64  Pulse: (!) 51 61 66 66  Resp: 11 18 16 16   Temp: 97.8 F (36.6 C) 97.9 F (36.6 C) 98.4 F (36.9 C) 97.8 F (36.6 C)  TempSrc: Oral Oral Oral Oral  SpO2: 100% 100% 98% 99%  Weight: 118.3 kg     Height:       Physical Exam General:well appearing female in NAD Heart:RRR, no mrg Lungs:CTAB, nml WOB on 2L O2 Abdomen:soft, NTND Extremities:b/l AKA Dialysis Access: [facility]   Filed Weights   04/24/22 0522 04/24/22 1528 04/24/22 1935  Weight: 120.8 kg 121.3 kg 118.3 kg    Intake/Output Summary (Last 24 hours) at 04/25/2022 1530 Last data filed at 04/25/2022 0239 Gross per 24 hour  Intake 360 ml  Output 3000 ml  Net -2640 ml    Additional Objective Labs: Basic Metabolic Panel: Recent Labs  Lab 04/19/22 0800 04/24/22 1542  NA 137 138  K 5.0 5.8*  CL 95* 99  CO2 27 26  GLUCOSE 100* 113*  BUN 44* 59*  CREATININE 7.72* 9.29*  CALCIUM 9.5 9.3  PHOS 6.3* 6.0*   Liver Function Tests: Recent Labs  Lab 04/19/22 0800 04/24/22 1542  ALBUMIN 3.2* 3.3*   CBC: Recent Labs  Lab 04/19/22 0500 04/24/22 1542  WBC 6.5 6.7  NEUTROABS 4.2 4.1  HGB 8.2* 8.5*  HCT 26.6* 28.0*  MCV 93.3 94.3  PLT 175 179    Medications:  sodium thiosulfate 12.5 g in sodium chloride 0.9 % 150 mL 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 (04/24/22 1757)    aspirin EC  81 mg Oral Daily   atorvastatin  10 mg Oral Daily   darbepoetin (ARANESP) injection - DIALYSIS  200 mcg Subcutaneous Q Tue-1800   docusate sodium  100 mg Oral BID   famotidine  20 mg Oral Daily   feeding supplement  1 Container Oral TID BM   fentaNYL  1 patch Transdermal Q72H   gabapentin  100 mg Oral QHS    Gerhardt's butt cream   Topical BID   heparin injection (subcutaneous)  5,000 Units Subcutaneous Q8H   levothyroxine  100 mcg Oral Q0600   midodrine  10 mg Oral Once per day on Tue Thu Sat   pantoprazole  40 mg Oral Daily   pregabalin  50 mg Oral Daily   sevelamer carbonate  4.8 g Oral TID WC   sucroferric oxyhydroxide  500 mg Oral TID WC    Dialysis Orders: Debility: Has been unable to tolerate dialysis in recliner and therefore unable to discharge to outpatient dialysis. Per her report has lost >50 lbs during admission. Sacral decub ulcers remain a barrier. She now has new powerchair/cushion and has been sitting for longer periods.  Appreciate efforts from PT team. In good spirits today.  ESRD: Was usual TTS schedule .  Refusing HD at times. Back on schedule. Next HD 1/20. Update labs.  HD access: new TDC placed 02/04/2022, TDC stopped working again 11/30. IR consulted - S/p venogram, angioplasty of SVC and innominate veins, and TDC exchange. No recent issues reported.  Painful nodular lesions in abdomen/possible calciphylaxis(no BX): Initially felt just d/t subq heparin injections, but  then worsening. Started empirically on sodium thiosulfate, but caused vomiting so stopped - now getting 1/2 dose - 12.5mg  q HD, refuses increased dose. Not on any calcium based products or vitamin D. HTN /volume: On midodrine 10mg  qHD, with 10mg  prn mid HD w/parameters to keep SBP >100 d/t becoming symptomatic when it drops lower. Significant weight loss this admit Severe ischial sacral decubitus wound: CT showing chronic osteomyelitis. Limiting her, noted to be slow healing. Goal to tolerate sitting upright - which she is doing with PT.  COPD/Chronic O2 use: Prior O2 at home (5L/min) -> tapered to 2-3L/min here Anemia of ESRD: Hgb 8.5 on max dose Aranesp 239mcg q Tues. Recent Fe load completed.  Secondary hyperparathyroidism: On two binders. CorrCa elevated. Not on VDRA, last PTH 86 (low) on 03/11/22. Avoid all  calcium based medications for Vitamin D d/t concern for calciphylaxis.  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 Nutrition -ALB 3.3, diet continues to be changed from renal.  Currently on regular diet.  Would really prefer she be on a renal diet but patient has been refusing Disposition: Difficult situation. Prior to admit, patient required 6 staff members and 2 hoyer pads for transfer into HD chair. Could not stay on HD for full tx. 2/2 pain of multiple decubitus ulcers and PTAR unable to lift pt into ambulance for transport.SW working on LTC at [employer]. Will need to tolerate 5-6hrs in a chair to tolerate OP dialysis including transport, treatment and cannulation time.  The main issue is her Rt ischial wound. She now has new power chair and cushion. She has been sitting in the chair for longer periods. Up to 4 hours past week.   Jen Mow, PA-C [facility] 04/25/2022,3:30 PM  LOS: 175 days

## 2022-04-25 NOTE — Progress Notes (Signed)
PROGRESS NOTE                                                                                                                                                                                                             Patient Demographics:    Courtney Gray, is a 52 y.o. female, DOB - [birth year], QAS:[id number]  Outpatient Primary MD for the patient is Monico Blitz, MD    LOS - [place] date - 11/01/2021    Chief Complaint  Patient presents with   Vascular Access Problem       Brief Narrative (HPI from H&P)   52 year old with past medical history significant for ESRD on hemodialysis TTS, recent hospitalization for bacteremia, hepatic and splenic abscess on long-term IV antibiotics, diastolic heart failure, PVD status post bilateral AKA, insulin-dependent diabetes type 2, morbid obesity and chronic sacral wound presents with progressive shortness of breath in the setting of missing hemodialysis and admitted for hypertensive emergency.  Patient with long length of stay, discharge pending due to ability to obtain outpatient hemodialysis that can accumulate patient's mobility constraints secondary to decubitus ulcer wound and bilateral AKA.  I assumed her care on 11/01/2021 on day 173 of her hospital stay.   Subjective:   Patient in bed, appears comfortable, denies any headache, no fever, no chest pain or pressure, no shortness of breath , no abdominal pain. No new focal weakness.   Assessment  & Plan :   End stage renal disease ([place]) - Renal following the patient currently on TTS schedule.  Awaiting outpatient CLIP.  Hypertension - blood pressure now running soft, patient on midodrine on HD to prevent hypotension, off BP Meds.   Decubitus ulcer of sacral region, stage 3 (HCC) - Deep wound at the sacrum, patient not able to seat.   Continue with oral oxycodone dose has been decreased to 2,5 mg, Continue with local wound care.  Continue with gabapentin &duloxetine for chronic pain syndrome.   Dyslipidemia.  On statin.  Bacteremia with septic shock earlier in admission - initially had septic shock along with bacteremia with Bacteroides and splenic abscess.  This problem has resolved she has finished her antibiotics, she also had abdominal drain which was removed on 11/13/2021.  PVD (peripheral vascular disease) (HCC) - Bilateral AKA.  Continue local wound care. Now patient has an Conservator, museum/gallery,  patient interested in getting bilateral prosthetic devices, unfortunately this will be done outpatient in the orthopedic office.  Class 3 obesity (HCC)  - Calculated BMI is 42.6  Acute on chronic diastolic CHF (congestive heart failure) (HCC) - Volume regulation with ultrafiltration in hemodialysis, continue midodrine for blood pressure support.   Nodule of skin of abdomen - seen, case was reviewed by previous MD Dr. 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  Continue to monitor.  Pre DM2 - diet controlled  Lab Results  Component Value Date   HGBA1C 5.3 12/19/2021         Condition - Fair  Family Communication  :  None present  Code Status :  Full  Consults  :  Renal, ID  PUD Prophylaxis : PPI   Procedures  :            Disposition Plan  :    Status is: Inpatient   DVT Prophylaxis  :   Heparin   Lab Results  Component Value Date   PLT 179 04/24/2022    Diet :  Diet Order             Diet regular Room service appropriate? Yes; Fluid consistency: Thin  Diet effective now                    Inpatient Medications  Scheduled Meds:  aspirin EC  81 mg Oral Daily   atorvastatin  10 mg Oral Daily   darbepoetin (ARANESP) injection - DIALYSIS  200 mcg Subcutaneous Q Tue-1800   docusate sodium  100 mg Oral BID   famotidine  20 mg Oral Daily    feeding supplement  1 Container Oral TID BM   gabapentin  100 mg Oral QHS   Gerhardt's butt cream   Topical BID   heparin injection (subcutaneous)  5,000 Units Subcutaneous Q8H   levothyroxine  100 mcg Oral Q0600   midodrine  10 mg Oral Once per day on Tue Thu Sat   pantoprazole  40 mg Oral Daily   pregabalin  50 mg Oral Daily   sevelamer carbonate  4.8 g Oral TID WC   sucroferric oxyhydroxide  500 mg Oral TID WC   Continuous Infusions:  sodium thiosulfate 12.5 g in sodium chloride 0.9 % 150 mL 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 (04/24/22 1757)   PRN Meds:.acetaminophen, bisacodyl, iohexol, lactulose, loperamide, loratadine, ondansetron, mouth rinse, oxyCODONE, polyethylene glycol, simethicone   Objective:   Vitals:   04/24/22 1905 04/24/22 1935 04/24/22 2111 04/25/22 0540  BP: (!) 145/130 135/68 (!) 147/74 (!) 158/83  Pulse: (!) 55 (!) 51 61 66  Resp: 12 11 18 16   Temp:  97.8 F (36.6 C) 97.9 F (36.6 C) 98.4 F (36.9 C)  TempSrc:  Oral Oral Oral  SpO2: 100% 100% 100% 98%  Weight:  118.3 kg    Height:        Wt Readings from Last 3 Encounters:  04/24/22 118.3 kg  10/21/21 (!) 144 kg  09/03/21 (!) 150.6 kg     Intake/Output Summary (Last 24 hours) at 04/25/2022 0904 Last data filed at 04/25/2022 0239 Gross per 24 hour  Intake 660 ml  Output 3000 ml  Net -2340 ml     Physical Exam  Awake Alert, No new F.N deficits, Normal affect [place].AT,PERRAL Supple Neck, No JVD,  Symmetrical Chest wall movement, Good air movement bilaterally, CTAB RRR,No Gallops,Rubs or new Murmurs,  +ve B.Sounds, Abd Soft, No tenderness,   Bilat AKA, Abd calciphylaxis wounds stable    RN pressure injury documentation: Pressure Injury 11/12/21 Hip Right Unstageable - Full thickness tissue loss in which the base of the injury is covered by slough (yellow, tan, gray, green or brown) and/or eschar (tan, brown or black) in the wound bed. 90% covered with slough (Active)  11/12/21 0800   Location: Hip  Location Orientation: Right  Staging: Unstageable - Full thickness tissue loss in which the base of the injury is covered by slough (yellow, tan, gray, green or brown) and/or eschar (tan, brown or black) in the wound bed.  Wound Description (Comments): 90% covered with slough  Present on Admission: Yes (per RN)  Dressing Type Foam - Lift dressing to assess site every shift 04/24/22 2030     Pressure Injury 11/24/21 Flank Left;Lower Stage 2 -  Partial thickness loss of dermis presenting as a shallow open injury with a red, pink wound bed without slough. (Active)  11/24/21 0430  Location: Flank  Location Orientation: Left;Lower  Staging: Stage 2 -  Partial thickness loss of dermis presenting as a shallow open injury with a red, pink wound bed without slough.  Wound Description (Comments):   Present on Admission: No  Dressing Type Foam - Lift dressing to assess site every shift 04/24/22 2030      Data Review:    Recent Labs  Lab 04/19/22 0500 04/24/22 1542  WBC 6.5 6.7  HGB 8.2* 8.5*  HCT 26.6* 28.0*  PLT 175 179  MCV 93.3 94.3  MCH 28.8 28.6  MCHC 30.8 30.4  RDW 17.2* 17.0*  LYMPHSABS 1.3 1.4  MONOABS 0.7 0.7  EOSABS 0.3 0.4  BASOSABS 0.0 0.1    Recent Labs  Lab 04/19/22 0800 04/24/22 1542  NA 137 138  K 5.0 5.8*  CL 95* 99  CO2 27 26  ANIONGAP 15 13  GLUCOSE 100* 113*  BUN 44* 59*  CREATININE 7.72* 9.29*  ALBUMIN 3.2* 3.3*  CALCIUM 9.5 9.3       Signature  -   Lala Lund M.D on 04/25/2022 at 9:04 AM   -  To page go to [email or web address]

## 2022-04-25 NOTE — Plan of Care (Signed)
Courtney Gray [id number] 10-13-1970   The nephrology service will not follow Courtney Gray over the weekend 1/20-1/21/24.  Please page at [phone] if any questions or concerns and will see if needed.   Jen Mow, PA-C [employer]

## 2022-04-26 DIAGNOSIS — N186 End stage renal disease: Secondary | ICD-10-CM | POA: Diagnosis not present

## 2022-04-26 NOTE — Progress Notes (Signed)
Wound care done at this time. Sheets and pads changed as well.   Courtney Gray Courtney Gray

## 2022-04-26 NOTE — Progress Notes (Signed)
Pt refused HD this am.   Foster Simpson [facility]

## 2022-04-26 NOTE — Progress Notes (Signed)
Pt refused to come to HD this AM pt advised of risks

## 2022-04-26 NOTE — Plan of Care (Signed)
  Problem: Clinical Measurements: Goal: Respiratory complications will improve Outcome: Progressing Goal: Cardiovascular complication will be avoided Outcome: Progressing   Problem: Nutrition: Goal: Adequate nutrition will be maintained Outcome: Progressing   Problem: Coping: Goal: Level of anxiety will decrease Outcome: Progressing   Problem: Pain Managment: Goal: General experience of comfort will improve Outcome: Progressing   

## 2022-04-26 NOTE — Progress Notes (Signed)
PROGRESS NOTE                                                                                                                                                                                                             Patient Demographics:    Courtney Gray, is a 52 y.o. female, DOB - 04/28/1970, FIE:[id number]  Outpatient Primary MD for the patient is Monico Blitz, MD    LOS - 71  Admit date - 11/01/2021    Chief Complaint  Patient presents with   Vascular Access Problem       Brief Narrative (HPI from H&P)   52 year old with past medical history significant for ESRD on hemodialysis TTS, recent hospitalization for bacteremia, hepatic and splenic abscess on long-term IV antibiotics, diastolic heart failure, PVD status post bilateral AKA, insulin-dependent diabetes type 2, morbid obesity and chronic sacral wound presents with progressive shortness of breath in the setting of missing hemodialysis and admitted for hypertensive emergency.  Patient with long length of stay, discharge pending due to ability to obtain outpatient hemodialysis that can accumulate patient's mobility constraints secondary to decubitus ulcer wound and bilateral AKA.  I assumed her care on 11/01/2021 on day 173 of her hospital stay.   Subjective:   Patient in bed, appears comfortable, denies any headache, no fever, no chest pain or pressure, no shortness of breath , no abdominal pain. No new focal weakness.  Improved calciphylaxis induced pain.   Assessment  & Plan :   End stage renal disease ([place]) - Renal following the patient currently on TTS schedule.  Awaiting outpatient CLIP.  Hypertension - blood pressure now running soft, patient on midodrine on HD to prevent hypotension, off BP Meds.   Decubitus ulcer of sacral region, stage 3 (HCC) - Deep wound at the sacrum, patient not able to seat.   Continue with oral oxycodone dose has been decreased to  2,5 mg, Continue with local wound care. Continue with gabapentin & duloxetine for chronic pain syndrome.  Fentanyl patch added on 04/25/2022 with much relief in her pain.  Dyslipidemia.  On statin.  Bacteremia with septic shock earlier in admission - initially had septic shock along with bacteremia with Bacteroides and splenic abscess.  This problem has resolved she has finished her antibiotics, she also had abdominal drain which was removed on 11/13/2021.  PVD (peripheral  vascular disease) ([place]) - Bilateral AKA.  Continue local wound care. Now patient has an electric chair, patient interested in getting bilateral prosthetic devices, unfortunately this will be done outpatient in the orthopedic office.  Class 3 obesity (HCC)  - Calculated BMI is 42.6  Acute on chronic diastolic CHF (congestive heart failure) (HCC) - Volume regulation with ultrafiltration in hemodialysis, continue midodrine for blood pressure support.   Nodule of skin of abdomen - seen, case was reviewed by previous MD Dr. 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  Continue to monitor.  Pre DM2 - diet controlled  Lab Results  Component Value Date   HGBA1C 5.3 12/19/2021         Condition - Fair  Family Communication  :  None present  Code Status :  Full  Consults  :  Renal, ID  PUD Prophylaxis : PPI   Procedures  :            Disposition Plan  :    Status is: Inpatient   DVT Prophylaxis  :   Heparin   Lab Results  Component Value Date   PLT 179 04/24/2022    Diet :  Diet Order             Diet regular Room service appropriate? Yes; Fluid consistency: Thin; Fluid restriction: 1500 mL Fluid  Diet effective now                    Inpatient Medications  Scheduled Meds:  aspirin EC  81 mg Oral Daily   atorvastatin  10 mg Oral Daily    darbepoetin (ARANESP) injection - DIALYSIS  200 mcg Subcutaneous Q Tue-1800   docusate sodium  100 mg Oral BID   famotidine  20 mg Oral Daily   feeding supplement  1 Container Oral TID BM   fentaNYL  1 patch Transdermal Q72H   gabapentin  100 mg Oral QHS   Gerhardt's butt cream   Topical BID   heparin injection (subcutaneous)  5,000 Units Subcutaneous Q8H   levothyroxine  100 mcg Oral Q0600   midodrine  10 mg Oral Once per day on Tue Thu Sat   pantoprazole  40 mg Oral Daily   pregabalin  50 mg Oral Daily   sevelamer carbonate  4.8 g Oral TID WC   sucroferric oxyhydroxide  500 mg Oral TID WC   Continuous Infusions:  sodium thiosulfate 12.5 g in sodium chloride 0.9 % 150 mL 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 (04/24/22 1757)   PRN Meds:.acetaminophen, bisacodyl, iohexol, lactulose, loperamide, loratadine, ondansetron, mouth rinse, oxyCODONE, polyethylene glycol, simethicone   Objective:   Vitals:   04/25/22 1700 04/25/22 2049 04/26/22 0608 04/26/22 0936  BP: (!) 148/61 (!) 120/44 (!) 162/85 (!) 125/54  Pulse: 67 61 (!) 59 (!) 57  Resp: 16 18 18 18   Temp: 97.9 F (36.6 C) 97.9 F (36.6 C) (!) 97.4 F (36.3 C) 97.8 F (36.6 C)  TempSrc:  Oral Oral Oral  SpO2: 99% 100% 94% 100%  Weight:      Height:        Wt Readings from Last 3 Encounters:  04/24/22 118.3 kg  10/21/21 (!) 144 kg  09/03/21 (!) 150.6 kg     Intake/Output Summary (Last 24 hours) at 04/26/2022 0943 Last data filed at 04/26/2022 0656 Gross per 24 hour  Intake 1260 ml  Output 0 ml  Net 1260 ml     Physical Exam  Awake Alert, No new F.N deficits, Normal affect [place].AT,PERRAL Supple Neck, No JVD,   Symmetrical Chest wall movement, Good air movement bilaterally, CTAB RRR,No Gallops,Rubs or new Murmurs,  +ve B.Sounds, Abd Soft, No tenderness,   Bilat AKA, Abd calciphylaxis wounds stable    RN pressure injury documentation: Pressure Injury 11/12/21 Hip Right Unstageable - Full thickness tissue loss in  which the base of the injury is covered by slough (yellow, tan, gray, green or brown) and/or eschar (tan, brown or black) in the wound bed. 90% covered with slough (Active)  11/12/21 0800  Location: Hip  Location Orientation: Right  Staging: Unstageable - Full thickness tissue loss in which the base of the injury is covered by slough (yellow, tan, gray, green or brown) and/or eschar (tan, brown or black) in the wound bed.  Wound Description (Comments): 90% covered with slough  Present on Admission: Yes (per RN)  Dressing Type Foam - Lift dressing to assess site every shift 04/25/22 2100     Pressure Injury 11/24/21 Flank Left;Lower Stage 2 -  Partial thickness loss of dermis presenting as a shallow open injury with a red, pink wound bed without slough. (Active)  11/24/21 0430  Location: Flank  Location Orientation: Left;Lower  Staging: Stage 2 -  Partial thickness loss of dermis presenting as a shallow open injury with a red, pink wound bed without slough.  Wound Description (Comments):   Present on Admission: No  Dressing Type Foam - Lift dressing to assess site every shift 04/25/22 2100      Data Review:    Recent Labs  Lab 04/24/22 1542  WBC 6.7  HGB 8.5*  HCT 28.0*  PLT 179  MCV 94.3  MCH 28.6  MCHC 30.4  RDW 17.0*  LYMPHSABS 1.4  MONOABS 0.7  EOSABS 0.4  BASOSABS 0.1    Recent Labs  Lab 04/24/22 1542  NA 138  K 5.8*  CL 99  CO2 26  ANIONGAP 13  GLUCOSE 113*  BUN 59*  CREATININE 9.29*  ALBUMIN 3.3*  CALCIUM 9.3       Signature  -   Lala Lund M.D on 04/26/2022 at 9:43 AM   -  To page go to [email or web address]

## 2022-04-26 NOTE — Progress Notes (Signed)
Pt went to HD at this time. Midodrine was given and Calciphylaxis medication was sent to HD with pt.   Foster Simpson [facility]

## 2022-04-27 DIAGNOSIS — N186 End stage renal disease: Secondary | ICD-10-CM | POA: Diagnosis not present

## 2022-04-27 LAB — RENAL FUNCTION PANEL
Albumin: 3.4 g/dL — ABNORMAL LOW (ref 3.5–5.0)
Anion gap: 11 (ref 5–15)
BUN: 14 mg/dL (ref 6–20)
CO2: 29 mmol/L (ref 22–32)
Calcium: 8.5 mg/dL — ABNORMAL LOW (ref 8.9–10.3)
Chloride: 96 mmol/L — ABNORMAL LOW (ref 98–111)
Creatinine, Ser: 2.32 mg/dL — ABNORMAL HIGH (ref 0.44–1.00)
GFR, Estimated: 25 mL/min — ABNORMAL LOW (ref >60)
Glucose, Bld: 96 mg/dL (ref 70–99)
Phosphorus: 1.1 mg/dL — ABNORMAL LOW (ref 2.5–4.6)
Potassium: 2.8 mmol/L — ABNORMAL LOW (ref 3.5–5.1)
Sodium: 136 mmol/L (ref 135–145)

## 2022-04-27 MED ORDER — HEPARIN SODIUM (PORCINE) 1000 UNIT/ML IJ SOLN
INTRAMUSCULAR | Status: AC
  Filled 2022-04-27: qty 5

## 2022-04-27 MED ORDER — POTASSIUM CHLORIDE CRYS ER 20 MEQ PO TBCR
20.0000 meq | EXTENDED_RELEASE_TABLET | Freq: Once | ORAL | Status: AC
  Administered 2022-04-27: 20 meq via ORAL
  Filled 2022-04-27: qty 1

## 2022-04-27 NOTE — Progress Notes (Signed)
PROGRESS NOTE                                                                                                                                                                                                             Patient Demographics:    Courtney Gray, is a 52 y.o. female, DOB - December 05, 1970, EYC:[id number]  Outpatient Primary MD for the patient is Monico Blitz, MD    LOS - 104  Admit date - 11/01/2021    Chief Complaint  Patient presents with   Vascular Access Problem       Brief Narrative (HPI from H&P)   52 year old with past medical history significant for ESRD on hemodialysis TTS, recent hospitalization for bacteremia, hepatic and splenic abscess on long-term IV antibiotics, diastolic heart failure, PVD status post bilateral AKA, insulin-dependent diabetes type 2, morbid obesity and chronic sacral wound presents with progressive shortness of breath in the setting of missing hemodialysis and admitted for hypertensive emergency.  Patient with long length of stay, discharge pending due to ability to obtain outpatient hemodialysis that can accumulate patient's mobility constraints secondary to decubitus ulcer wound and bilateral AKA.  I assumed her care on 11/01/2021 on day 173 of her hospital stay.   Subjective:   Patient in bed, appears comfortable, denies any headache, no fever, no chest pain or pressure, no shortness of breath , no abdominal pain but had mild nausea morning of 04/27/2022, she does not want to take any medications for it. No new focal weakness.   Assessment  & Plan :   End stage renal disease ([place]) - Renal following the patient currently on TTS schedule.  Awaiting outpatient CLIP.  Hypertension - blood pressure now running soft, patient on midodrine on HD to prevent hypotension, off BP Meds.   Decubitus ulcer of sacral region, stage 3 (HCC) - Deep wound at the sacrum, patient not able to seat.    Continue with oral oxycodone dose has been decreased to 2,5 mg, Continue with local wound care. Continue with gabapentin & duloxetine for chronic pain syndrome.  Fentanyl patch added on 04/25/2022 with much relief in her pain.  Dyslipidemia.  On statin.  Bacteremia with septic shock earlier in admission - initially had septic shock along with bacteremia with Bacteroides and splenic abscess.  This problem has resolved she has finished her antibiotics, she  also had abdominal drain which was removed on 11/13/2021.  PVD (peripheral vascular disease) (HCC) - Bilateral AKA.  Continue local wound care. Now patient has an electric chair, patient interested in getting bilateral prosthetic devices, unfortunately this will be done outpatient in the orthopedic office.  Class 3 obesity (HCC)  - Calculated BMI is 42.6  Acute on chronic diastolic CHF (congestive heart failure) (HCC) - Volume regulation with ultrafiltration in hemodialysis, continue midodrine for blood pressure support.   Nodule of skin of abdomen - seen, case was reviewed by previous MD Dr. 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  Continue to monitor.  Pre DM2 - diet controlled  Lab Results  Component Value Date   HGBA1C 5.3 12/19/2021         Condition - Fair  Family Communication  :  None present  Code Status :  Full  Consults  :  Renal, ID  PUD Prophylaxis : PPI   Procedures  :            Disposition Plan  :    Status is: Inpatient   DVT Prophylaxis  :   Heparin   Lab Results  Component Value Date   PLT 179 04/24/2022    Diet :  Diet Order             Diet regular Room service appropriate? Yes; Fluid consistency: Thin; Fluid restriction: 1500 mL Fluid  Diet effective now                    Inpatient Medications  Scheduled Meds:  aspirin EC  81 mg  Oral Daily   atorvastatin  10 mg Oral Daily   darbepoetin (ARANESP) injection - DIALYSIS  200 mcg Subcutaneous Q Tue-1800   docusate sodium  100 mg Oral BID   famotidine  20 mg Oral Daily   feeding supplement  1 Container Oral TID BM   fentaNYL  1 patch Transdermal Q72H   gabapentin  100 mg Oral QHS   Gerhardt's butt cream   Topical BID   heparin injection (subcutaneous)  5,000 Units Subcutaneous Q8H   heparin sodium (porcine)       levothyroxine  100 mcg Oral Q0600   midodrine  10 mg Oral Once per day on Tue Thu Sat   pantoprazole  40 mg Oral Daily   pregabalin  50 mg Oral Daily   sevelamer carbonate  4.8 g Oral TID WC   sucroferric oxyhydroxide  500 mg Oral TID WC   Continuous Infusions:  sodium thiosulfate 12.5 g in sodium chloride 0.9 % 150 mL 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 (04/24/22 1757)   PRN Meds:.acetaminophen, bisacodyl, heparin sodium (porcine), iohexol, lactulose, loperamide, loratadine, ondansetron, mouth rinse, oxyCODONE, polyethylene glycol, simethicone   Objective:   Vitals:   04/27/22 0301 04/27/22 0316 04/27/22 0331 04/27/22 0402  BP: (!) 107/95 (!) 119/53 (!) 150/71 (!) 123/57  Pulse: (!) 58 60 (!) 53 (!) 51  Resp: 16 16 12 14   Temp:    97.8 F (36.6 C)  TempSrc:    Oral  SpO2: 100% 100% 100% 100%  Weight:      Height:        Wt Readings from Last 3 Encounters:  04/24/22 118.3 kg  10/21/21 (!) 144 kg  09/03/21 (!) 150.6 kg     Intake/Output Summary (Last  24 hours) at 04/27/2022 1016 Last data filed at 04/27/2022 0600 Gross per 24 hour  Intake 480 ml  Output 2000 ml  Net -1520 ml     Physical Exam  Awake Alert, No new F.N deficits, Normal affect [place].AT,PERRAL Supple Neck, No JVD,   Symmetrical Chest wall movement, Good air movement bilaterally, CTAB RRR,No Gallops,Rubs or new Murmurs,  +ve B.Sounds, Abd Soft, No tenderness,   Bilat AKA, Abd calciphylaxis wounds stable    RN pressure injury documentation: Pressure Injury 11/12/21 Hip  Right Unstageable - Full thickness tissue loss in which the base of the injury is covered by slough (yellow, tan, gray, green or brown) and/or eschar (tan, brown or black) in the wound bed. 90% covered with slough (Active)  11/12/21 0800  Location: Hip  Location Orientation: Right  Staging: Unstageable - Full thickness tissue loss in which the base of the injury is covered by slough (yellow, tan, gray, green or brown) and/or eschar (tan, brown or black) in the wound bed.  Wound Description (Comments): 90% covered with slough  Present on Admission: Yes (per RN)  Dressing Type Foam - Lift dressing to assess site every shift;Impregnated gauze (petrolatum) 04/27/22 0700     Pressure Injury 11/24/21 Flank Left;Lower Stage 2 -  Partial thickness loss of dermis presenting as a shallow open injury with a red, pink wound bed without slough. (Active)  11/24/21 0430  Location: Flank  Location Orientation: Left;Lower  Staging: Stage 2 -  Partial thickness loss of dermis presenting as a shallow open injury with a red, pink wound bed without slough.  Wound Description (Comments):   Present on Admission: No  Dressing Type Foam - Lift dressing to assess site every shift 04/26/22 2000      Data Review:    Recent Labs  Lab 04/24/22 1542  WBC 6.7  HGB 8.5*  HCT 28.0*  PLT 179  MCV 94.3  MCH 28.6  MCHC 30.4  RDW 17.0*  LYMPHSABS 1.4  MONOABS 0.7  EOSABS 0.4  BASOSABS 0.1    Recent Labs  Lab 04/24/22 1542 04/27/22 0240  NA 138 136  K 5.8* 2.8*  CL 99 96*  CO2 26 29  ANIONGAP 13 11  GLUCOSE 113* 96  BUN 59* 14  CREATININE 9.29* 2.32*  ALBUMIN 3.3* 3.4*  CALCIUM 9.3 8.5*       Signature  -   Lala Lund M.D on 04/27/2022 at 10:16 AM   -  To page go to [email or web address]

## 2022-04-27 NOTE — Progress Notes (Signed)
Patient did not want to redress wounds  said all was good from am dressing change.

## 2022-04-28 DIAGNOSIS — N186 End stage renal disease: Secondary | ICD-10-CM | POA: Diagnosis not present

## 2022-04-28 MED ORDER — APIXABAN 2.5 MG PO TABS
2.5000 mg | ORAL_TABLET | Freq: Two times a day (BID) | ORAL | Status: DC
  Administered 2022-04-28 – 2022-05-15 (×32): 2.5 mg via ORAL
  Filled 2022-04-28 (×33): qty 1

## 2022-04-28 MED ORDER — SODIUM THIOSULFATE 250 MG/ML IV SOLN
12.5000 g | Freq: Once | INTRAVENOUS | Status: DC
  Filled 2022-04-28: qty 50

## 2022-04-28 NOTE — Progress Notes (Signed)
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    PT Comments    Continuing work on functional mobility and activity tolerance;  Pt sleepy today after receiving pain meds; Declining getting OOB to power chair, but curious about rationale for chair push up therex, and particiapted in a discussion re: press-up transfers, and how they differ from scooting transfers -- specifically, less pressure on Ischial wound;   Looking ahead, would like for [place] to work on bed to power chair, then power chair to HD recliner (via Maximove lift to minimize scooting transfers, and potentially more approximate how it will be done at [facility]);   Might be worth considering asking the Education officer, museum from [place] about how [place]ola's access to [facility] will look like; other info like the make and model of their mechanical lift, and size of their wide HD recliner will be helpful as well   Recommendations for follow up therapy are one component of a multi-disciplinary discharge planning process, led by the attending physician.  Recommendations may be updated based on patient status, additional functional criteria and insurance authorization.  Follow Up Recommendations  Home health PT (consider PACE services vs SNF pending housing) Can patient physically be transported by private vehicle: No   Assistance Recommended at Discharge Intermittent Supervision/Assistance  Patient can return home with the following A little help with walking and/or transfers;Help with stairs or ramp for entrance;Assist  for transportation (medical [name] transport)   Financial risk analyst (measurements PT);Wheelchair cushion (measurements PT);Other (comment);Hospital bed (more accomodating mechanical lift, with sling that handles pt's with bil AKAs;  cushion (20x24), power wheelchair with power tilt and recline)    Recommendations for Other Services       Precautions / Restrictions Precautions Precautions: Fall Precaution Comments: bil AKA, 2L O2 at baseline (has been tolerating mobility in power chair on [employer] fine); when in power chair, MUST perform pressure relief every 30 minutes by tilting back for 10 minutes Restrictions Other Position/Activity Restrictions: when in power chair, MUST perform pressure relief every 30 minutes by tilting back for 10 minutes     Mobility  Bed Mobility Overal bed mobility: Modified Independent             General bed mobility comments: Managing well with bedrails    Transfers                   General transfer comment: Politely declined; too sleepy    Ambulation/Gait                   Stairs             Wheelchair Mobility    Modified Rankin (Stroke Patients Only)       Balance                                            Cognition Arousal/Alertness: Awake/alert Behavior During Therapy: WFL for tasks assessed/performed Overall Cognitive Status: Within Functional  Limits for tasks assessed                                          Exercises Other Exercises Other Exercises: Chair push ups with bolsters x10 with one rest break    General Comments General comments (skin integrity, edema, etc.): Pt asked about rationale for chair push up therex; explained and demonstrated how more strength with chair pushups will help strentghten and progress to more press-up type transfers than scooting, which puts a lot less pressure on ischial wound      Pertinent Vitals/Pain Pain  Assessment Pain Assessment: Faces Faces Pain Scale: Hurts little more Pain Location: R abdomen Pain Descriptors / Indicators: Grimacing, Discomfort Pain Intervention(s): Premedicated before session, Repositioned    Home Living                          Prior Function            PT Goals (current goals can now be found in the care plan section) Acute Rehab PT Goals Patient Stated Goal: Reports wants to be up in power chair and be more independent PT Goal Formulation: With patient Time For Goal Achievement: 04/19/22 Potential to Achieve Goals: Good Progress towards PT goals: Progressing toward goals (slowly)    Frequency    Min 3X/week      PT Plan Current plan remains appropriate (pt and her daughter must work on finding accessible housing)    Co-evaluation              AM-PAC PT "6 Clicks" Mobility   Outcome Measure  Help needed turning from your back to your side while in a flat bed without using bedrails?: None Help needed moving from lying on your back to sitting on the side of a flat bed without using bedrails?: None Help needed moving to and from a bed to a chair (including a wheelchair)?: A Little Help needed standing up from a chair using your arms (e.g., wheelchair or bedside chair)?: Total Help needed to walk in hospital room?: Total Help needed climbing 3-5 steps with a railing? : Total 6 Click Score: 14    End of Session Equipment Utilized During Treatment: Other (comment) (bolsters) Activity Tolerance: Patient tolerated treatment well Patient left: in bed;with call bell/phone within reach Nurse Communication: Mobility status PT Visit Diagnosis: Other abnormalities of gait and mobility (R26.89);Muscle weakness (generalized) (M62.81) Pain - Right/Left: Right Pain - part of body:  (R abdomen)     Time: 1040-1100 PT Time Calculation (min) (ACUTE ONLY): 20 min  Charges:  $Therapeutic Exercise: 8-22 mins                     Roney Marion, PT  Acute Rehabilitation Services Office [phone]    Courtney Gray 04/28/2022, 2:48 PM

## 2022-04-28 NOTE — Progress Notes (Signed)
PROGRESS NOTE                                                                                                                                                                                                             Patient Demographics:    Courtney Gray, is a 52 y.o. female, DOB - 05-Oct-1970, [id number]  Outpatient Primary MD for the patient is Monico Blitz, MD    LOS - 75  Admit date - 11/01/2021    Chief Complaint  Patient presents with   Vascular Access Problem       Brief Narrative (HPI from H&P)   52 year old with past medical history significant for ESRD on hemodialysis TTS, recent hospitalization for bacteremia, hepatic and splenic abscess on long-term IV antibiotics, diastolic heart failure, PVD status post bilateral AKA, insulin-dependent diabetes type 2, morbid obesity and chronic sacral wound presents with progressive shortness of breath in the setting of missing hemodialysis and admitted for hypertensive emergency.  Patient with long length of stay, discharge pending due to ability to obtain outpatient hemodialysis that can accumulate patient's mobility constraints secondary to decubitus ulcer wound and bilateral AKA.  I assumed her care on 11/01/2021 on day 173 of her hospital stay.   Subjective:   Patient in bed, appears comfortable, denies any headache, no fever, no chest pain or pressure, no shortness of breath , no abdominal pain but had mild nausea morning of 04/27/2022, she does not want to take any medications for it. No new focal weakness.   Assessment  & Plan :   End stage renal disease ([place]) - Renal following the patient currently on TTS schedule.  Awaiting outpatient CLIP.  Hypertension - blood pressure now running soft, patient on midodrine on HD to prevent hypotension, off BP Meds.   Decubitus ulcer of sacral region, stage 3 (HCC) - Deep wound at the sacrum, patient not able to seat.    Continue with oral oxycodone dose has been decreased to 2,5 mg, Continue with local wound care. Continue with gabapentin & duloxetine for chronic pain syndrome.  Fentanyl patch added on 04/25/2022 with much relief in her pain.  Dyslipidemia.  On statin.  Bacteremia with septic shock earlier in admission - initially had septic shock along with bacteremia with Bacteroides and splenic abscess.  This problem has resolved she has finished her antibiotics, she  also had abdominal drain which was removed on 11/13/2021.  PVD (peripheral vascular disease) (HCC) - Bilateral AKA.  Continue local wound care. Now patient has an electric chair, patient interested in getting bilateral prosthetic devices, unfortunately this will be done outpatient in the orthopedic office.  Class 3 obesity (HCC)  - Calculated BMI is 42.6  Acute on chronic diastolic CHF (congestive heart failure) (HCC) - Volume regulation with ultrafiltration in hemodialysis, continue midodrine for blood pressure support.   Nodule of skin of abdomen - seen, case was reviewed by previous MD Dr. 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  Continue to monitor.  Pre DM2 - diet controlled  Lab Results  Component Value Date   HGBA1C 5.3 12/19/2021         Condition - Fair  Family Communication  :  None present  Code Status :  Full  Consults  :  Renal, ID  PUD Prophylaxis : PPI   Procedures  :            Disposition Plan  :    Status is: Inpatient   DVT Prophylaxis  :   Patient has refused Lovenox and heparin for DVT prophylaxis, have counseled her she is agreeable to taking Eliquis will consult pharmacy on 04/28/2022 for the same.  Lab Results  Component Value Date   PLT 179 04/24/2022    Diet :  Diet Order             Diet regular Room service appropriate? Yes; Fluid  consistency: Thin; Fluid restriction: 1500 mL Fluid  Diet effective now                    Inpatient Medications  Scheduled Meds:  aspirin EC  81 mg Oral Daily   atorvastatin  10 mg Oral Daily   darbepoetin (ARANESP) injection - DIALYSIS  200 mcg Subcutaneous Q Tue-1800   docusate sodium  100 mg Oral BID   famotidine  20 mg Oral Daily   feeding supplement  1 Container Oral TID BM   fentaNYL  1 patch Transdermal Q72H   gabapentin  100 mg Oral QHS   Gerhardt's butt cream   Topical BID   heparin injection (subcutaneous)  5,000 Units Subcutaneous Q8H   levothyroxine  100 mcg Oral Q0600   midodrine  10 mg Oral Once per day on Tue Thu Sat   pantoprazole  40 mg Oral Daily   pregabalin  50 mg Oral Daily   sevelamer carbonate  4.8 g Oral TID WC   sucroferric oxyhydroxide  500 mg Oral TID WC   Continuous Infusions:  sodium thiosulfate 12.5 g in sodium chloride 0.9 % 150 mL 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 (04/24/22 1757)   PRN Meds:.acetaminophen, bisacodyl, iohexol, lactulose, loperamide, loratadine, ondansetron, mouth rinse, oxyCODONE, polyethylene glycol, simethicone   Objective:   Vitals:   04/27/22 0402 04/27/22 1643 04/27/22 2124 04/28/22 0552  BP: (!) 123/57 (!) 148/72 (!) 143/77 (!) 142/53  Pulse: (!) 51 60 70 65  Resp: 14 18 18 18   Temp: 97.8 F (36.6 C) 97.7 F (36.5 C) 98 F (36.7 C) 98.3 F (36.8 C)  TempSrc: Oral Oral Oral Oral  SpO2: 100% 100% 100% 100%  Weight:      Height:        Wt Readings from Last 3 Encounters:  04/24/22  118.3 kg  10/21/21 (!) 144 kg  09/03/21 (!) 150.6 kg     Intake/Output Summary (Last 24 hours) at 04/28/2022 0909 Last data filed at 04/28/2022 0552 Gross per 24 hour  Intake 1080 ml  Output 0 ml  Net 1080 ml     Physical Exam  Awake Alert, No new F.N deficits, Normal affect [place].AT,PERRAL Supple Neck, No JVD,   Symmetrical Chest wall movement, Good air movement bilaterally, CTAB RRR,No Gallops,Rubs or new Murmurs,   +ve B.Sounds, Abd Soft, No tenderness,   Bilat AKA, Abd calciphylaxis wounds stable    RN pressure injury documentation: Pressure Injury 11/12/21 Hip Right Unstageable - Full thickness tissue loss in which the base of the injury is covered by slough (yellow, tan, gray, green or brown) and/or eschar (tan, brown or black) in the wound bed. 90% covered with slough (Active)  11/12/21 0800  Location: Hip  Location Orientation: Right  Staging: Unstageable - Full thickness tissue loss in which the base of the injury is covered by slough (yellow, tan, gray, green or brown) and/or eschar (tan, brown or black) in the wound bed.  Wound Description (Comments): 90% covered with slough  Present on Admission: Yes (per RN)  Dressing Type Foam - Lift dressing to assess site every shift 04/27/22 2000     Pressure Injury 11/24/21 Flank Left;Lower Stage 2 -  Partial thickness loss of dermis presenting as a shallow open injury with a red, pink wound bed without slough. (Active)  11/24/21 0430  Location: Flank  Location Orientation: Left;Lower  Staging: Stage 2 -  Partial thickness loss of dermis presenting as a shallow open injury with a red, pink wound bed without slough.  Wound Description (Comments):   Present on Admission: No  Dressing Type Foam - Lift dressing to assess site every shift 04/27/22 2000      Data Review:    Recent Labs  Lab 04/24/22 1542  WBC 6.7  HGB 8.5*  HCT 28.0*  PLT 179  MCV 94.3  MCH 28.6  MCHC 30.4  RDW 17.0*  LYMPHSABS 1.4  MONOABS 0.7  EOSABS 0.4  BASOSABS 0.1    Recent Labs  Lab 04/24/22 1542 04/27/22 0240  NA 138 136  K 5.8* 2.8*  CL 99 96*  CO2 26 29  ANIONGAP 13 11  GLUCOSE 113* 96  BUN 59* 14  CREATININE 9.29* 2.32*  ALBUMIN 3.3* 3.4*  CALCIUM 9.3 8.5*   Signature  -   Lala Lund M.D on 04/28/2022 at 9:09 AM   -  To page go to [email or web address]

## 2022-04-28 NOTE — Progress Notes (Signed)
[facility] Progress Note   Subjective:  Seen in room. Didn't get much rest last night. Side hurts. No new concerns today  Objective Vitals:   04/27/22 1643 04/27/22 2124 04/28/22 0552 04/28/22 0934  BP: (!) 148/72 (!) 143/77 (!) 142/53 (!) 144/50  Pulse: 60 70 65 61  Resp: 18 18 18 16   Temp: 97.7 F (36.5 C) 98 F (36.7 C) 98.3 F (36.8 C) 98 F (36.7 C)  TempSrc: Oral Oral Oral Oral  SpO2: 100% 100% 100% 99%  Weight:      Height:       Physical Exam General: well appearing female in NAD Heart: RRR, no mrg Lungs: CTAB, nml WOB on 2L O2 Abdomen: soft, NTND Extremities: b/l AKA Dialysis Access: [facility]   Filed Weights   04/24/22 0522 04/24/22 1528 04/24/22 1935  Weight: 120.8 kg 121.3 kg 118.3 kg    Intake/Output Summary (Last 24 hours) at 04/28/2022 1543 Last data filed at 04/28/2022 1500 Gross per 24 hour  Intake 1020 ml  Output 0 ml  Net 1020 ml     Additional Objective Labs: Basic Metabolic Panel: Recent Labs  Lab 04/24/22 1542 04/27/22 0240  NA 138 136  K 5.8* 2.8*  CL 99 96*  CO2 26 29  GLUCOSE 113* 96  BUN 59* 14  CREATININE 9.29* 2.32*  CALCIUM 9.3 8.5*  PHOS 6.0* 1.1*    Liver Function Tests: Recent Labs  Lab 04/24/22 1542 04/27/22 0240  ALBUMIN 3.3* 3.4*    CBC: Recent Labs  Lab 04/24/22 1542  WBC 6.7  NEUTROABS 4.1  HGB 8.5*  HCT 28.0*  MCV 94.3  PLT 179     Medications:  sodium thiosulfate 12.5 g in sodium chloride 0.9 % 150 mL 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 (04/24/22 1757)   sodium thiosulfate 12.5 g in sodium chloride 0.9 % 150 mL Infusion for Calciphylaxis      apixaban  2.5 mg Oral BID   aspirin EC  81 mg Oral Daily   atorvastatin  10 mg Oral Daily   darbepoetin (ARANESP) injection - DIALYSIS  200 mcg Subcutaneous Q Tue-1800   docusate sodium  100 mg Oral BID   famotidine  20 mg Oral Daily   feeding supplement  1 Container Oral TID BM   fentaNYL  1 patch Transdermal Q72H   gabapentin  100 mg  Oral QHS   Gerhardt's butt cream   Topical BID   levothyroxine  100 mcg Oral Q0600   midodrine  10 mg Oral Once per day on Tue Thu Sat   pantoprazole  40 mg Oral Daily   pregabalin  50 mg Oral Daily   sevelamer carbonate  4.8 g Oral TID WC   sucroferric oxyhydroxide  500 mg Oral TID WC    Dialysis Orders: Debility: Has been unable to tolerate dialysis in recliner and therefore unable to discharge to outpatient dialysis. Per her report has lost >50 lbs during admission. Sacral decub ulcers remain a barrier. She now has new powerchair/cushion and has been sitting for longer periods.  Appreciate efforts from PT team. In good spirits today.  ESRD: Was usual TTS schedule .  Refusing HD at times. Back on schedule. Next HD 1/23. HD access: new TDC placed 02/04/2022, TDC stopped working again 11/30. IR consulted - S/p venogram, angioplasty of SVC and innominate veins, and TDC exchange. No recent issues reported.  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  Started empirically on sodium thiosulfate, but caused vomiting so stopped - now getting 1/2 dose - 12.5mg  q HD, refuses increased dose. Not on any calcium based products or vitamin D. HTN /volume: On midodrine 10mg  qHD, with 10mg  prn mid HD w/parameters to keep SBP >100 d/t becoming symptomatic when it drops lower. Significant weight loss this admit Severe ischial sacral decubitus wound: CT showing chronic osteomyelitis. Limiting her, noted to be slow healing. Goal to tolerate sitting upright - which she is doing with PT.  COPD/Chronic O2 use: Prior O2 at home (5L/min) -> tapered to 2-3L/min here Anemia of ESRD: Hgb 8.5 on max dose Aranesp 238mcg q Tues. Recent Fe load completed.  Secondary hyperparathyroidism: On two binders. CorrCa elevated. Not on VDRA, last PTH 86 (low) on 03/11/22. Avoid all calcium based medications for Vitamin D d/t concern for  calciphylaxis.  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 Nutrition -ALB 3.3, diet continues to be changed from renal.  Currently on regular diet.  Would really prefer she be on a renal diet but patient has been refusing Disposition: Difficult situation. Prior to admit, patient required 6 staff members and 2 hoyer pads for transfer into HD chair. Could not stay on HD for full tx. 2/2 pain of multiple decubitus ulcers and PTAR unable to lift pt into ambulance for transport.SW working on LTC at [employer]. Will need to tolerate 5-6hrs in a chair to tolerate OP dialysis including transport, treatment and cannulation time.  The main issue is her Rt ischial wound. She now has new power chair and cushion. She has been sitting in the chair for longer periods. Up to 4 hours past week.   [name] PA-C [facility] 04/28/2022,3:44 PM

## 2022-04-28 NOTE — Progress Notes (Addendum)
ANTICOAGULATION CONSULT NOTE - Initial Consult  Pharmacy Consult for apixaban Indication: VTE prophylaxis  Allergies  Allergen Reactions   Benzonatate Other (See Comments)    Hallucinations   Hydralazine Other (See Comments)    Hallucinations   Amoxicillin Itching and Nausea And Vomiting   Clonidine Other (See Comments)    Altered mental state, insomnia    Chlorhexidine Itching    Patient reports it is the CHG baths that cause her itching not the CHG used for caring for her IV/HD access.     Morphine Itching   Oxycodone Itching    Pt tolerated hospital admission 07/2021    Patient Measurements: Height: 5\' 5"  (165.1 cm) Weight:  (Patient Refuse) IBW/kg (Calculated) : 57   Vital Signs: Temp: 98 F (36.7 C) (01/22 0934) Temp Source: Oral (01/22 0934) BP: 144/50 (01/22 0934) Pulse Rate: 61 (01/22 0934)  Labs: Recent Labs    04/27/22 0240  CREATININE 2.32*    Estimated Creatinine Clearance: 36.9 mL/min (A) (by C-G formula based on SCr of 2.32 mg/dL (H)).   Medical History: Past Medical History:  Diagnosis Date   Arthritis    Diabetes mellitus without complication ([place])    Dialysis complication, subsequent encounter    Gout    High cholesterol    Hypertension    Renal disorder      Assessment: 52 yo W with ESRD and prolonged hospitalization refusing heparin for DVT prophylaxis. MD discussed options with patient and she is amenable to trying apixaban. Pharmacy consulted for apixaban for DVT prophylaxis, which is an off label use. Unfortunately, patient does not have other options for DVT prophylaxis and apixaban can be given in ESRD patients for other indications. Note patient is also on aspirin for severe PAD, will continue per MD.   Goal of Therapy:  Monitor platelets by anticoagulation protocol: Yes   Plan:  Apixaban 2.5mg  BID  Monitor for signs/symptoms of bleeding   Benetta Spar, PharmD, BCPS, BCCP Clinical Pharmacist  Please check AMION for all [place] phone numbers After 10:00 PM, call [place] [phone]

## 2022-04-29 DIAGNOSIS — N186 End stage renal disease: Secondary | ICD-10-CM | POA: Diagnosis not present

## 2022-04-29 LAB — CBC
HCT: 29.3 % — ABNORMAL LOW (ref 36.0–46.0)
Hemoglobin: 8.3 g/dL — ABNORMAL LOW (ref 12.0–15.0)
MCH: 27.4 pg (ref 26.0–34.0)
MCHC: 28.3 g/dL — ABNORMAL LOW (ref 30.0–36.0)
MCV: 96.7 fL (ref 80.0–100.0)
Platelets: 190 10*3/uL (ref 150–400)
RBC: 3.03 MIL/uL — ABNORMAL LOW (ref 3.87–5.11)
RDW: 17 % — ABNORMAL HIGH (ref 11.5–15.5)
WBC: 6.4 10*3/uL (ref 4.0–10.5)
nRBC: 0 % (ref 0.0–0.2)

## 2022-04-29 LAB — RENAL FUNCTION PANEL
Albumin: 3.3 g/dL — ABNORMAL LOW (ref 3.5–5.0)
Anion gap: 14 (ref 5–15)
BUN: 57 mg/dL — ABNORMAL HIGH (ref 6–20)
CO2: 25 mmol/L (ref 22–32)
Calcium: 9.9 mg/dL (ref 8.9–10.3)
Chloride: 99 mmol/L (ref 98–111)
Creatinine, Ser: 9.07 mg/dL — ABNORMAL HIGH (ref 0.44–1.00)
GFR, Estimated: 5 mL/min — ABNORMAL LOW (ref >60)
Glucose, Bld: 85 mg/dL (ref 70–99)
Phosphorus: 5.7 mg/dL — ABNORMAL HIGH (ref 2.5–4.6)
Potassium: 6 mmol/L — ABNORMAL HIGH (ref 3.5–5.1)
Sodium: 138 mmol/L (ref 135–145)

## 2022-04-29 MED ORDER — HEPARIN SODIUM (PORCINE) 1000 UNIT/ML DIALYSIS
2500.0000 [IU] | INTRAMUSCULAR | Status: DC | PRN
  Administered 2022-04-29: 2500 [IU] via INTRAVENOUS_CENTRAL

## 2022-04-29 MED ORDER — TOBRAMYCIN 0.3 % OP OINT
TOPICAL_OINTMENT | Freq: Three times a day (TID) | OPHTHALMIC | Status: AC
  Filled 2022-04-29 (×3): qty 3.5

## 2022-04-29 MED ORDER — HEPARIN SODIUM (PORCINE) 1000 UNIT/ML IJ SOLN
INTRAMUSCULAR | Status: AC
  Administered 2022-04-29: 4200 [IU]
  Filled 2022-04-29: qty 3

## 2022-04-29 MED ORDER — HEPARIN SODIUM (PORCINE) 1000 UNIT/ML IJ SOLN
2500.0000 [IU] | Freq: Once | INTRAMUSCULAR | Status: DC
  Filled 2022-04-29: qty 3

## 2022-04-29 MED ORDER — ANTICOAGULANT SODIUM CITRATE 4% (200MG/5ML) IV SOLN: 5.0000 mL | Status: DC | PRN

## 2022-04-29 MED ORDER — ALTEPLASE 2 MG IJ SOLR: 2.0000 mg | Freq: Once | INTRAMUSCULAR | Status: DC | PRN

## 2022-04-29 MED ORDER — FENTANYL 50 MCG/HR TD PT72
1.0000 | MEDICATED_PATCH | TRANSDERMAL | Status: DC
  Administered 2022-04-29: 1 via TRANSDERMAL
  Filled 2022-04-29: qty 1

## 2022-04-29 MED ORDER — HEPARIN SODIUM (PORCINE) 1000 UNIT/ML DIALYSIS: 1000.0000 [IU] | INTRAMUSCULAR | Status: DC | PRN

## 2022-04-29 NOTE — Progress Notes (Signed)
[facility] Progress Note   Subjective:  Seen in [place]. Tolerating UF so far. No new concerns  Objective Vitals:   04/29/22 0947 04/29/22 1452 04/29/22 1500 04/29/22 1530  BP: (!) 145/46 (!) 162/52 (!) 162/52 (!) 161/52  Pulse: 60 61 (!) 52 68  Resp: 17 18 (!) 21 18  Temp: 97.7 F (36.5 C)     TempSrc: Oral     SpO2: 100% 100% 100% 99%  Weight:      Height:       Physical Exam General: well appearing female in NAD Heart: RRR, no mrg Lungs: CTAB, nml WOB on 2L O2 Abdomen: soft, NTND Extremities: b/l AKA Dialysis Access: [facility]   Filed Weights   04/24/22 0522 04/24/22 1528 04/24/22 1935  Weight: 120.8 kg 121.3 kg 118.3 kg    Intake/Output Summary (Last 24 hours) at 04/29/2022 1603 Last data filed at 04/29/2022 1300 Gross per 24 hour  Intake 960 ml  Output 0 ml  Net 960 ml     Additional Objective Labs: Basic Metabolic Panel: Recent Labs  Lab 04/24/22 1542 04/27/22 0240 04/29/22 1441  NA 138 136 138  K 5.8* 2.8* 6.0*  CL 99 96* 99  CO2 26 29 25   GLUCOSE 113* 96 85  BUN 59* 14 57*  CREATININE 9.29* 2.32* 9.07*  CALCIUM 9.3 8.5* 9.9  PHOS 6.0* 1.1* 5.7*    Liver Function Tests: Recent Labs  Lab 04/24/22 1542 04/27/22 0240 04/29/22 1441  ALBUMIN 3.3* 3.4* 3.3*    CBC: Recent Labs  Lab 04/24/22 1542 04/29/22 1441  WBC 6.7 6.4  NEUTROABS 4.1  --   HGB 8.5* 8.3*  HCT 28.0* 29.3*  MCV 94.3 96.7  PLT 179 190     Medications:  anticoagulant sodium citrate     sodium thiosulfate 12.5 g in sodium chloride 0.9 % 150 mL 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 (04/24/22 1757)   sodium thiosulfate 12.5 g in sodium chloride 0.9 % 150 mL Infusion for Calciphylaxis      apixaban  2.5 mg Oral BID   aspirin EC  81 mg Oral Daily   atorvastatin  10 mg Oral Daily   darbepoetin (ARANESP) injection - DIALYSIS  200 mcg Subcutaneous Q Tue-1800   docusate sodium  100 mg Oral BID   famotidine  20 mg Oral Daily   feeding supplement  1 Container Oral TID  BM   fentaNYL  1 patch Transdermal Q72H   gabapentin  100 mg Oral QHS   Gerhardt's butt cream   Topical BID   heparin sodium (porcine)       heparin sodium (porcine)  2,500 Units Intravenous Once   levothyroxine  100 mcg Oral Q0600   midodrine  10 mg Oral Once per day on Tue Thu Sat   pantoprazole  40 mg Oral Daily   pregabalin  50 mg Oral Daily   sevelamer carbonate  4.8 g Oral TID WC   sucroferric oxyhydroxide  500 mg Oral TID WC   tobramycin   Left Eye TID    Dialysis Orders: Debility: Has been unable to tolerate dialysis in recliner and therefore unable to discharge to outpatient dialysis. Per her report has lost >50 lbs during admission. Sacral decub ulcers remain a barrier. She now has new powerchair/cushion and has been sitting for longer periods.  Appreciate efforts from PT team. In good spirits today.  ESRD: Was usual TTS schedule .  Refusing HD at times. Back on schedule. Next HD 1/23. Continue  low K bath  HD access: new TDC placed 02/04/2022, TDC stopped working again 11/30. IR consulted - S/p venogram, angioplasty of SVC and innominate veins, and TDC exchange. No recent issues reported.  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 Started empirically on sodium thiosulfate, but caused vomiting so stopped - now getting 1/2 dose - 12.5mg  q HD, refuses increased dose. Not on any calcium based products or vitamin D. HTN /volume: On midodrine 10mg  qHD, with 10mg  prn mid HD w/parameters to keep SBP >100 d/t becoming symptomatic when it drops lower. Significant weight loss this admit Severe ischial sacral decubitus wound: CT showing chronic osteomyelitis. Limiting her, noted to be slow healing. Goal to tolerate sitting upright - which she is doing with PT.  COPD/Chronic O2 use: Prior O2 at home (5L/min) -> tapered to 2-3L/min here Anemia of ESRD: Hgb 8.3 on max dose Aranesp 231mcg q Tues. Recent Fe  load completed.  Secondary hyperparathyroidism: On two binders. CorrCa elevated. Not on VDRA, last PTH 86 (low) on 03/11/22. Avoid all calcium based medications for Vitamin D d/t concern for calciphylaxis.  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 Nutrition -ALB 3.3, diet continues to be changed from renal.  Currently on regular diet.  Would really prefer she be on a renal diet but patient has been refusing Disposition: Difficult situation. Prior to admit, patient required 6 staff members and 2 hoyer pads for transfer into HD chair. Could not stay on HD for full tx. 2/2 pain of multiple decubitus ulcers and PTAR unable to lift pt into ambulance for transport.SW working on LTC at [employer]. Will need to tolerate 5-6hrs in a chair to tolerate OP dialysis including transport, treatment and cannulation time.  The main issue is her Rt ischial wound. She now has new power chair and cushion. She has been sitting in the chair for longer periods. Up to 4 hours past week.   [name] PA-C [facility] 04/29/2022,4:03 PM

## 2022-04-29 NOTE — Progress Notes (Signed)
PROGRESS NOTE                                                                                                                                                                                                             Patient Demographics:    Courtney Gray, is a 52 y.o. female, DOB - 06-13-1970, LOV:[id number]  Outpatient Primary MD for the patient is Monico Blitz, MD    LOS - 92  Admit date - 11/01/2021    Chief Complaint  Patient presents with   Vascular Access Problem       Brief Narrative (HPI from H&P)   52 year old with past medical history significant for ESRD on hemodialysis TTS, recent hospitalization for bacteremia, hepatic and splenic abscess on long-term IV antibiotics, diastolic heart failure, PVD status post bilateral AKA, insulin-dependent diabetes type 2, morbid obesity and chronic sacral wound presents with progressive shortness of breath in the setting of missing hemodialysis and admitted for hypertensive emergency.  Patient with long length of stay, discharge pending due to ability to obtain outpatient hemodialysis that can accumulate patient's mobility constraints secondary to decubitus ulcer wound and bilateral AKA.  I assumed her care on 11/01/2021 on day 173 of her hospital stay.   Subjective:   Patient in bed, appears comfortable, denies any headache, no fever, no chest pain or pressure, no shortness of breath , no abdominal pain. No new focal weakness.  Calciphylaxis induced kidney nodule pain improving.   Assessment  & Plan :   End stage renal disease ([place]) - Renal following the patient currently on TTS schedule.  Awaiting outpatient CLIP.  Hypertension - blood pressure now running soft, patient on midodrine on HD to prevent hypotension, off BP Meds.   Decubitus ulcer of sacral region, stage 3 (HCC) - Deep wound at the sacrum, patient not able to seat.   Continue with oral oxycodone dose has been  decreased to 2,5 mg, Continue with local wound care. Continue with gabapentin & duloxetine for chronic pain syndrome.  Fentanyl patch added with significant leaf in pain, dose adjusted on 04/29/2022 for better control.  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  Pain control as above.  Dyslipidemia.  On statin.  Bacteremia with septic shock earlier in admission - initially had septic shock along with bacteremia with Bacteroides and splenic abscess.  This problem has resolved she has finished her antibiotics, she also had abdominal drain which was removed on 11/13/2021.  PVD (peripheral vascular disease) (HCC) - Bilateral AKA.  Continue local wound care. Now patient has an electric chair, patient interested in getting bilateral prosthetic devices, unfortunately this will be done outpatient in the orthopedic office.  Class 3 obesity (HCC)  - Calculated BMI is 42.6  Acute on chronic diastolic CHF (congestive heart failure) (HCC) - Volume regulation with ultrafiltration in hemodialysis, continue midodrine for blood pressure support.   Pre DM2 - diet controlled  Lab Results  Component Value Date   HGBA1C 5.3 12/19/2021         Condition - Fair  Family Communication  :  None present  Code Status :  Full  Consults  :  Renal, ID  PUD Prophylaxis : PPI   Procedures  :            Disposition Plan  :    Status is: Inpatient   DVT Prophylaxis  :   Patient has refused Lovenox and heparin for DVT prophylaxis, have counseled her she is agreeable to taking Eliquis will consult pharmacy on 04/28/2022 for the same.  Lab Results  Component Value Date   PLT 179 04/24/2022    Diet :  Diet Order             Diet regular Room service appropriate? Yes; Fluid  consistency: Thin; Fluid restriction: 1500 mL Fluid  Diet effective now                    Inpatient Medications  Scheduled Meds:  apixaban  2.5 mg Oral BID   aspirin EC  81 mg Oral Daily   atorvastatin  10 mg Oral Daily   darbepoetin (ARANESP) injection - DIALYSIS  200 mcg Subcutaneous Q Tue-1800   docusate sodium  100 mg Oral BID   famotidine  20 mg Oral Daily   feeding supplement  1 Container Oral TID BM   fentaNYL  1 patch Transdermal Q72H   gabapentin  100 mg Oral QHS   Gerhardt's butt cream   Topical BID   levothyroxine  100 mcg Oral Q0600   midodrine  10 mg Oral Once per day on Tue Thu Sat   pantoprazole  40 mg Oral Daily   pregabalin  50 mg Oral Daily   sevelamer carbonate  4.8 g Oral TID WC   sucroferric oxyhydroxide  500 mg Oral TID WC   Continuous Infusions:  sodium thiosulfate 12.5 g in sodium chloride 0.9 % 150 mL 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 (04/24/22 1757)   sodium thiosulfate 12.5 g in sodium chloride 0.9 % 150 mL Infusion for Calciphylaxis     PRN Meds:.acetaminophen, bisacodyl, iohexol, lactulose, loperamide, loratadine, ondansetron, mouth rinse, oxyCODONE, polyethylene glycol, simethicone   Objective:   Vitals:   04/28/22 0934 04/28/22 1656 04/28/22 2032 04/29/22 0534  BP: (!) 144/50 (!) 149/41 (!) 144/67 (!) 151/89  Pulse: 61 65 (!) 59 64  Resp: 16 17 18 18   Temp: 98 F (36.7 C) 97.8 F (36.6 C) 98.2 F (36.8 C) 97.8 F (36.6 C)  TempSrc: Oral  Oral Oral  SpO2: 99% 99%  100%  Weight:      Height:  Wt Readings from Last 3 Encounters:  04/24/22 118.3 kg  10/21/21 (!) 144 kg  09/03/21 (!) 150.6 kg     Intake/Output Summary (Last 24 hours) at 04/29/2022 0925 Last data filed at 04/29/2022 0646 Gross per 24 hour  Intake 960 ml  Output 0 ml  Net 960 ml     Physical Exam  Awake Alert, No new F.N deficits, Normal affect [place].AT,PERRAL Supple Neck, No JVD,   Symmetrical Chest wall movement, Good air movement bilaterally,  CTAB RRR,No Gallops,Rubs or new Murmurs,  +ve B.Sounds, Abd Soft, No tenderness,   Bilat AKA, Abd calciphylaxis wounds stable    RN pressure injury documentation: Pressure Injury 11/12/21 Hip Right Unstageable - Full thickness tissue loss in which the base of the injury is covered by slough (yellow, tan, gray, green or brown) and/or eschar (tan, brown or black) in the wound bed. 90% covered with slough (Active)  11/12/21 0800  Location: Hip  Location Orientation: Right  Staging: Unstageable - Full thickness tissue loss in which the base of the injury is covered by slough (yellow, tan, gray, green or brown) and/or eschar (tan, brown or black) in the wound bed.  Wound Description (Comments): 90% covered with slough  Present on Admission: Yes (per RN)  Dressing Type Foam - Lift dressing to assess site every shift 04/28/22 2000     Pressure Injury 11/24/21 Flank Left;Lower Stage 2 -  Partial thickness loss of dermis presenting as a shallow open injury with a red, pink wound bed without slough. (Active)  11/24/21 0430  Location: Flank  Location Orientation: Left;Lower  Staging: Stage 2 -  Partial thickness loss of dermis presenting as a shallow open injury with a red, pink wound bed without slough.  Wound Description (Comments):   Present on Admission: No  Dressing Type Foam - Lift dressing to assess site every shift 04/28/22 2000      Data Review:    Recent Labs  Lab 04/24/22 1542  WBC 6.7  HGB 8.5*  HCT 28.0*  PLT 179  MCV 94.3  MCH 28.6  MCHC 30.4  RDW 17.0*  LYMPHSABS 1.4  MONOABS 0.7  EOSABS 0.4  BASOSABS 0.1    Recent Labs  Lab 04/24/22 1542 04/27/22 0240  NA 138 136  K 5.8* 2.8*  CL 99 96*  CO2 26 29  ANIONGAP 13 11  GLUCOSE 113* 96  BUN 59* 14  CREATININE 9.29* 2.32*  ALBUMIN 3.3* 3.4*  CALCIUM 9.3 8.5*   Signature  -   Lala Lund M.D on 04/29/2022 at 9:25 AM   -  To page go to [email or web address]

## 2022-04-29 NOTE — Progress Notes (Signed)
PT Cancellation Note  Patient Details Name: Gelena Klosinski MRN: [id number] DOB: Apr 08, 1970   Cancelled Treatment:    Reason Eval/Treat Not Completed: Other (comment)  Attempted to see pt for PT before HD;  Politely declined;   Briefly discussed case with Renal Navigator;   Will continue to follow,   Roney Marion, [place] [facility] 04/29/2022, 11:03 AM

## 2022-04-29 NOTE — Procedures (Signed)
HD Note:  Some information was entered later than the data was gathered due to patient care needs. The stated time with the data is accurate.  Received patient in bed to unit.  Alert and oriented.  Informed consent signed and in chart.   Patient tolerated well.   Transported back to the room  Alert, without acute distress.  Hand-off given to patient's nurse.   Access used: Right chest HD catheter Access issues: None  Total UF removed: 3000 ml   [facility]

## 2022-04-30 DIAGNOSIS — N186 End stage renal disease: Secondary | ICD-10-CM | POA: Diagnosis not present

## 2022-04-30 MED ORDER — FENTANYL 25 MCG/HR TD PT72
1.0000 | MEDICATED_PATCH | Freq: Once | TRANSDERMAL | Status: AC
  Administered 2022-05-01: 1 via TRANSDERMAL
  Filled 2022-04-30: qty 1

## 2022-04-30 MED ORDER — FENTANYL 25 MCG/HR TD PT72
1.0000 | MEDICATED_PATCH | TRANSDERMAL | Status: DC
  Administered 2022-05-03 – 2022-05-12 (×4): 1 via TRANSDERMAL
  Filled 2022-04-30 (×4): qty 1

## 2022-04-30 NOTE — Progress Notes (Signed)
[facility] Progress Note   Subjective:  Seen in room. No issues with dialysis yesterday. 3L removed. Irritable today, doesn't want to be bothered.   Objective Vitals:   04/29/22 1839 04/29/22 1953 04/30/22 0531 04/30/22 0947  BP: (!) 155/61 (!) 133/53 (!) 150/70 (!) 145/51  Pulse: (!) 59 (!) 59 (!) 59 63  Resp: 20 18 18    Temp:  98.7 F (37.1 C) 98.5 F (36.9 C) 98.4 F (36.9 C)  TempSrc:  Oral Oral Oral  SpO2: 100% 100% 91% 100%  Weight:      Height:       Physical Exam General: well appearing female in NAD Heart: RRR, no mrg Lungs: CTAB, nml WOB on 2L O2 Abdomen: soft, NTND Extremities: b/l AKA Dialysis Access: [facility]   Filed Weights   04/24/22 0522 04/24/22 1528 04/24/22 1935  Weight: 120.8 kg 121.3 kg 118.3 kg    Intake/Output Summary (Last 24 hours) at 04/30/2022 1340 Last data filed at 04/30/2022 0900 Gross per 24 hour  Intake 357 ml  Output 3000 ml  Net -2643 ml     Additional Objective Labs: Basic Metabolic Panel: Recent Labs  Lab 04/24/22 1542 04/27/22 0240 04/29/22 1441  NA 138 136 138  K 5.8* 2.8* 6.0*  CL 99 96* 99  CO2 26 29 25   GLUCOSE 113* 96 85  BUN 59* 14 57*  CREATININE 9.29* 2.32* 9.07*  CALCIUM 9.3 8.5* 9.9  PHOS 6.0* 1.1* 5.7*    Liver Function Tests: Recent Labs  Lab 04/24/22 1542 04/27/22 0240 04/29/22 1441  ALBUMIN 3.3* 3.4* 3.3*    CBC: Recent Labs  Lab 04/24/22 1542 04/29/22 1441  WBC 6.7 6.4  NEUTROABS 4.1  --   HGB 8.5* 8.3*  HCT 28.0* 29.3*  MCV 94.3 96.7  PLT 179 190     Medications:  sodium thiosulfate 12.5 g in sodium chloride 0.9 % 150 mL Infusion for Calciphylaxis Stopped (04/29/22 1746)   sodium thiosulfate 12.5 g in sodium chloride 0.9 % 150 mL Infusion for Calciphylaxis      apixaban  2.5 mg Oral BID   aspirin EC  81 mg Oral Daily   atorvastatin  10 mg Oral Daily   darbepoetin (ARANESP) injection - DIALYSIS  200 mcg Subcutaneous Q Tue-1800   docusate sodium  100 mg Oral BID    famotidine  20 mg Oral Daily   feeding supplement  1 Container Oral TID BM   fentaNYL  1 patch Transdermal Q72H   gabapentin  100 mg Oral QHS   Gerhardt's butt cream   Topical BID   heparin sodium (porcine)  2,500 Units Intravenous Once   levothyroxine  100 mcg Oral Q0600   midodrine  10 mg Oral Once per day on Tue Thu Sat   pantoprazole  40 mg Oral Daily   pregabalin  50 mg Oral Daily   sevelamer carbonate  4.8 g Oral TID WC   sucroferric oxyhydroxide  500 mg Oral TID WC   tobramycin   Left Eye TID    Dialysis Orders: Debility: Has been unable to tolerate dialysis in recliner and therefore unable to discharge to outpatient dialysis. Per her report has lost >50 lbs during admission. Sacral decub ulcers remain a barrier. She now has new powerchair/cushion and has been sitting for longer periods.  Appreciate efforts from PT team. In good spirits today.  ESRD: Was usual TTS schedule .  Refusing HD at times. Back on schedule. Next HD 1/25. Continue low K  bath  HD access: new TDC placed 02/04/2022, TDC stopped working again 11/30. IR consulted - S/p venogram, angioplasty of SVC and innominate veins, and TDC exchange. No recent issues reported.  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 Started empirically on sodium thiosulfate, but caused vomiting so stopped - now getting 1/2 dose - 12.5mg  q HD, refuses increased dose. Not on any calcium based products or vitamin D. HTN /volume: On midodrine 10mg  qHD, with 10mg  prn mid HD w/parameters to keep SBP >100 d/t becoming symptomatic when it drops lower. Significant weight loss this admit Severe ischial sacral decubitus wound: CT showing chronic osteomyelitis. Limiting her, noted to be slow healing. Goal to tolerate sitting upright - which she is doing with PT.  COPD/Chronic O2 use: Prior O2 at home (5L/min) -> tapered to 2-3L/min here Anemia of ESRD: Hgb 8.3 on  max dose Aranesp 252mcg q Tues. Recent Fe load completed.  Secondary hyperparathyroidism: On two binders. CorrCa elevated. Not on VDRA, last PTH 86 (low) on 03/11/22. Avoid all calcium based medications for Vitamin D d/t concern for calciphylaxis.  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 Nutrition -ALB 3.3, diet continues to be changed from renal.  Currently on regular diet.  Would really prefer she be on a renal diet but patient has been refusing Disposition: Difficult situation. Prior to admit, patient required 6 staff members and 2 hoyer pads for transfer into HD chair. Could not stay on HD for full tx. 2/2 pain of multiple decubitus ulcers and PTAR unable to lift pt into ambulance for transport.SW working on LTC at [employer]. Will need to tolerate 5-6hrs in a chair to tolerate OP dialysis including transport, treatment and cannulation time.  The main issue is her Rt ischial wound. She now has new power chair and cushion. She has been sitting in the chair for longer periods. Up to 4 hours past week.   [name] PA-C [facility] 04/30/2022,1:40 PM

## 2022-04-30 NOTE — Progress Notes (Signed)
OT Cancellation Note  Patient Details Name: Courtney Gray MRN: [id number] DOB: 01/08/1971   Cancelled Treatment:    Reason Eval/Treat Not Completed: Patient declined, no reason specified (patient declined OT session stating that she had 3 narcotics in her and "it's not going to happen") Lodema Hong, [place]  Office [place] 04/30/2022, 9:54 AM

## 2022-04-30 NOTE — TOC Progression Note (Signed)
Transition of Care [facility]) - Progression Note    Patient Details  Name: Courtney Gray MRN: [id number] Date of Birth: 1970-11-07  Transition of Care [facility]) CM/SW Contact  Tom-Johnson, Renea Ee, RN Phone Number: 04/30/2022, 2:32 PM  Clinical Narrative:     CM spoke with Hemodialysis Director on update about purchasing Bariatric Dialysis chair for the hospital to enable patient to sit for dialysis for four hours without pain prior going home to outpatient dialysis. CM was informed ETA is still pending. CM notified PT and leadership to see what other options can be used so that patient can sit for dialysis without using a stretcher which is the current discharge barrier at this time. PT will work with patient and update CM.  CM will continue to follow.      Expected Discharge Plan: Long Term Nursing Home Barriers to Discharge: No SNF bed, Transportation, Waiting for outpatient dialysis  Expected Discharge Plan and Services       Living arrangements for the past 2 months: Single Family Home                                       Social Determinants of Health (SDOH) Interventions SDOH Screenings   Food Insecurity: No Food Insecurity (12/11/2021)  Housing: Low Risk  (12/11/2021)  Transportation Needs: No Transportation Needs (12/11/2021)  Utilities: At Risk (12/11/2021)  Tobacco Use: Low Risk  (03/07/2022)    Readmission Risk Interventions    09/16/2021    4:36 PM 08/28/2021   11:50 AM 07/12/2021   12:38 PM  Readmission Risk Prevention Plan  Transportation Screening Complete Complete Complete  Medication Review [employer])  Complete Complete  PCP or Specialist appointment within 3-5 days of discharge  Complete Complete  HRI or Home Care Consult Complete Complete Complete  SW Recovery Care/Counseling Consult Complete Complete Complete  Palliative Care Screening  Not Applicable Not Applicable  Skilled Nursing Facility Complete Not Applicable Complete

## 2022-04-30 NOTE — Progress Notes (Signed)
PROGRESS NOTE    Courtney Gray  PJS:[id number] DOB: [birth year] DOA: 11/01/2021 PCP: Monico Blitz, MD   Brief Narrative: 52 year old with past medical history significant for ESRD on hemodialysis TTS, recent hospitalization for bacteremia, hepatic and splenic abscess on long-term IV antibiotics, diastolic heart failure, PVD status post bilateral AKA, insulin-dependent diabetes type 2, morbid obesity and chronic sacral wound presents with progressive shortness of breath in the setting of missing hemodialysis and admitted for hypertensive emergency. Patient with long length of stay, discharge pending due to ability to obtain outpatient hemodialysis that can accumulate patient's mobility constraints secondary to decubitus ulcer wound and bilateral AKA.  Patient will need to be able to seat HD chair to be able to get out patient HD set up. Hospital HD unit is working on getting bariatric HD chair to be able to accommodate patient.     Assessment & Plan:   Active Problems:   End stage renal disease ([place])   Hypertension   Decubitus ulcer of sacral region, stage 3 (HCC)   Type 2 diabetes mellitus with hyperlipidemia (HCC)   Chronic respiratory failure with hypoxia (HCC)   Bacteremia   PVD (peripheral vascular disease) (HCC)   Class 3 obesity (HCC)   Anemia due to chronic kidney disease   Hypertensive emergency   Leukocytosis   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HCC)   Pressure injury of skin   PAD (peripheral artery disease) (HCC)   Irritable bowel syndrome (IBS)   Amputation of left middle finger   GERD (gastroesophageal reflux disease)   Nodule of skin of abdomen   1-ESRD, HD: -Nephrology Following, TTS schedule. - Needs Bariatric HD chair in the unit to accommodate patient.  -Working on tolerance sitting in the chair -Continue with midodrine.  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  She is now has been off of oral antibiotics. -Patient declined antibiotics, due to concern for GI issues.  She will continue with wound care. -Doxycycline was discontinued 12/1 Local care.  On fentanyl Oxycodone for PRN pain.   3-Calciphylaxis: Abdominal wound.  Na-thiosulfate  per nephrology As needed oxycodone Continue with wound care.  She declines further imagines.   4-Hypotension during HD: Continue with midodrine on hemodialysis days Monitor BP.   Phantom limb pain: Continue with Lyrica, gabapentin.   Hyperkalemia; correction with HD. Check las today   Decubitus ulcer of sacral region stage III: Continue with wound care Hypothyroidism: Continue with Synthroid Diabetes type 2 with hyperlipidemia neuropathy: Continue with aspirin and statins Bacteremia: Patient has completed antibiotic therapy   PVD (peripheral vascular disease) ([place]) History of Bilateral AKA   Class 3 obesity (HCC) BMI >40    Chronic respiratory failure Due to morbid obesity  Constipation; PRN Dulcolax suppository    Pressure Injury 11/12/21 Hip Right Unstageable - Full thickness tissue loss in which the base of the injury is covered by slough (yellow, tan, gray, green or brown) and/or eschar (tan, brown or black) in the wound bed. 90% covered with slough (Active)  11/12/21 0800  Location: Hip  Location Orientation: Right  Staging: Unstageable - Full thickness tissue loss in which the base of the injury is covered by slough (yellow, tan, gray, green or brown) and/or eschar (tan, brown or black) in the wound bed.  Wound Description (Comments): 90% covered with slough  Present on Admission: Yes  Dressing Type Foam - Lift dressing to assess site every  shift;Gauze (Comment);Silver hydrofiber 04/29/22 2206     Pressure Injury 11/24/21 Flank Left;Lower Stage 2 -  Partial thickness loss of dermis presenting as a shallow open injury with a red, pink wound bed without slough.  (Active)  11/24/21 0430  Location: Flank  Location Orientation: Left;Lower  Staging: Stage 2 -  Partial thickness loss of dermis presenting as a shallow open injury with a red, pink wound bed without slough.  Wound Description (Comments):   Present on Admission: No  Dressing Type Foam - Lift dressing to assess site every shift 04/29/22 0715      Estimated body mass index is 43.4 kg/m as calculated from the following:   Height as of this encounter: 5\' 5"  (1.651 m).   Weight as of this encounter: 118.3 kg.   DVT prophylaxis: Aspirin Code Status: Full code Family Communication: Care discussed with  patient Disposition Plan:  Status is: Inpatient Remains inpatient appropriate because: Awaiting out patient HD set up, she needs to be able to sit for HD    Consultants:  Nephrology ID   Procedures:    Antimicrobials:    Subjective: She is feeling tired, she wants to rest today. She might get dulcolax suppository today.  She report some pain relieves at night with fentanyl patch.    Objective: Vitals:   04/29/22 1839 04/29/22 1953 04/30/22 0531 04/30/22 0947  BP: (!) 155/61 (!) 133/53 (!) 150/70 (!) 145/51  Pulse: (!) 59 (!) 59 (!) 59 63  Resp: 20 18 18    Temp:  98.7 F (37.1 C) 98.5 F (36.9 C) 98.4 F (36.9 C)  TempSrc:  Oral Oral Oral  SpO2: 100% 100% 91% 100%  Weight:      Height:        Intake/Output Summary (Last 24 hours) at 04/30/2022 1541 Last data filed at 04/30/2022 0900 Gross per 24 hour  Intake 357 ml  Output 3000 ml  Net -2643 ml    Filed Weights   04/24/22 0522 04/24/22 1528 04/24/22 1935  Weight: 120.8 kg 121.3 kg 118.3 kg    Examination:  General exam: NAD Respiratory system: CTA Cardiovascular system: S 1, S 2 RRR Gastrointestinal system: BS present, soft, nt Central nervous system: Alert.  Extremities: BL AKA Skin: Multiples wound  Data Reviewed: I have personally reviewed following labs and imaging studies  CBC: Recent Labs   Lab 04/24/22 1542 04/29/22 1441  WBC 6.7 6.4  NEUTROABS 4.1  --   HGB 8.5* 8.3*  HCT 28.0* 29.3*  MCV 94.3 96.7  PLT 179 387    Basic Metabolic Panel: Recent Labs  Lab 04/24/22 1542 04/27/22 0240 04/29/22 1441  NA 138 136 138  K 5.8* 2.8* 6.0*  CL 99 96* 99  CO2 26 29 25   GLUCOSE 113* 96 85  BUN 59* 14 57*  CREATININE 9.29* 2.32* 9.07*  CALCIUM 9.3 8.5* 9.9  PHOS 6.0* 1.1* 5.7*    GFR: Estimated Creatinine Clearance: 9.4 mL/min (A) (by C-G formula based on SCr of 9.07 mg/dL (H)). Liver Function Tests: Recent Labs  Lab 04/24/22 1542 04/27/22 0240 04/29/22 1441  ALBUMIN 3.3* 3.4* 3.3*    No results for input(s): "LIPASE", "AMYLASE" in the last 168 hours. No results for input(s): "AMMONIA" in the last 168 hours. Coagulation Profile: No results for input(s): "INR", "PROTIME" in the last 168 hours. Cardiac Enzymes: No results for input(s): "CKTOTAL", "CKMB", "CKMBINDEX", "TROPONINI" in the last 168 hours. BNP (last 3 results) No results for input(s): "PROBNP" in the last  8760 hours. HbA1C: No results for input(s): "HGBA1C" in the last 72 hours. CBG: No results for input(s): "GLUCAP" in the last 168 hours. Lipid Profile: No results for input(s): "CHOL", "HDL", "LDLCALC", "TRIG", "CHOLHDL", "LDLDIRECT" in the last 72 hours. Thyroid Function Tests: No results for input(s): "TSH", "T4TOTAL", "FREET4", "T3FREE", "THYROIDAB" in the last 72 hours. Anemia Panel: No results for input(s): "VITAMINB12", "FOLATE", "FERRITIN", "TIBC", "IRON", "RETICCTPCT" in the last 72 hours. Sepsis Labs: No results for input(s): "PROCALCITON", "LATICACIDVEN" in the last 168 hours.  No results found for this or any previous visit (from the past 240 hour(s)).       Radiology Studies: No results found.      Scheduled Meds:  apixaban  2.5 mg Oral BID   aspirin EC  81 mg Oral Daily   atorvastatin  10 mg Oral Daily   darbepoetin (ARANESP) injection - DIALYSIS  200 mcg  Subcutaneous Q Tue-1800   docusate sodium  100 mg Oral BID   famotidine  20 mg Oral Daily   feeding supplement  1 Container Oral TID BM   fentaNYL  1 patch Transdermal Q72H   gabapentin  100 mg Oral QHS   Gerhardt's butt cream   Topical BID   heparin sodium (porcine)  2,500 Units Intravenous Once   levothyroxine  100 mcg Oral Q0600   midodrine  10 mg Oral Once per day on Tue Thu Sat   pantoprazole  40 mg Oral Daily   pregabalin  50 mg Oral Daily   sevelamer carbonate  4.8 g Oral TID WC   sucroferric oxyhydroxide  500 mg Oral TID WC   tobramycin   Left Eye TID   Continuous Infusions:  sodium thiosulfate 12.5 g in sodium chloride 0.9 % 150 mL Infusion for Calciphylaxis Stopped (04/29/22 1746)   sodium thiosulfate 12.5 g in sodium chloride 0.9 % 150 mL Infusion for Calciphylaxis       LOS: 180 days    Time spent: 35 minutes    Marshaun Lortie A Quashawn Jewkes, MD [facility]   If 7PM-7AM, please contact night-coverage [email or web address]  04/30/2022, 3:41 PM

## 2022-04-30 NOTE — Progress Notes (Signed)
TR[facility] night cross cover note:   I was notified by RN of the patient's request to lower the dose of her fentanyl patch.  Earlier on 04/30/2022, the dose of her q72 hour fentanyl patch was increased from 25 mcg to 50 mcg.  Following this change in dose of fentanyl patch, the patient notes good pain control, but states that she has also noticed increased sleepiness and requests return to the previous dose of fentanyl patch (25 mcg).  I subsequently acquiesced with this request, and discontinued existing order for 50 mcg fentanyl patch, replacing it with new order for 25 mcg fentanyl patch.      Babs Bertin, DO Hospitalist

## 2022-05-01 DIAGNOSIS — L89312 Pressure ulcer of right buttock, stage 2: Secondary | ICD-10-CM | POA: Diagnosis not present

## 2022-05-01 DIAGNOSIS — N186 End stage renal disease: Secondary | ICD-10-CM | POA: Diagnosis not present

## 2022-05-01 MED ORDER — HEPARIN SODIUM (PORCINE) 1000 UNIT/ML DIALYSIS: 20.0000 [IU]/kg | INTRAMUSCULAR | Status: DC | PRN

## 2022-05-01 MED ORDER — ALTEPLASE 2 MG IJ SOLR: 2.0000 mg | Freq: Once | INTRAMUSCULAR | Status: DC | PRN

## 2022-05-01 MED ORDER — ANTICOAGULANT SODIUM CITRATE 4% (200MG/5ML) IV SOLN: 5.0000 mL | Status: DC | PRN

## 2022-05-01 MED ORDER — HEPARIN SODIUM (PORCINE) 1000 UNIT/ML IJ SOLN
INTRAMUSCULAR | Status: AC
  Filled 2022-05-01: qty 4

## 2022-05-01 MED ORDER — HEPARIN SODIUM (PORCINE) 1000 UNIT/ML DIALYSIS: 1000.0000 [IU] | INTRAMUSCULAR | Status: DC | PRN

## 2022-05-01 NOTE — Progress Notes (Signed)
Patient is stating she currently has 50 mcg Fentanyl patch applied to left arm.  She states she feel that is too strong.  She is feeling sleepy and has a decreased appetite.  She requests to go back to the 25 mcg Fentanyl patch.  Dr. Velia Meyer made aware.  Order received and implemented.  25 mcg Fentanyl patch applied to right arm.  50 mcg removed and wasted in the sharps container in medication room.Waste witnessed by Earleen Reaper RN and Arnette Felts RN.

## 2022-05-01 NOTE — Progress Notes (Signed)
[facility] Progress Note   Subjective:  Seen in room. No new events. Dialysis today.   Objective Vitals:   04/30/22 0531 04/30/22 0947 04/30/22 2034 05/01/22 0526  BP: (!) 150/70 (!) 145/51 (!) 145/57 130/77  Pulse: (!) 59 63 63 (!) 56  Resp: 18  17 18   Temp: 98.5 F (36.9 C) 98.4 F (36.9 C) 98.3 F (36.8 C) 98.6 F (37 C)  TempSrc: Oral Oral Oral Oral  SpO2: 91% 100% 99%   Weight:      Height:       Physical Exam General: well appearing female in NAD Heart: RRR, no mrg Lungs: CTAB, nml WOB on 2L O2 Abdomen: soft, NTND Extremities: b/l AKA Dialysis Access: [facility]   Filed Weights   04/24/22 0522 04/24/22 1528 04/24/22 1935  Weight: 120.8 kg 121.3 kg 118.3 kg    Intake/Output Summary (Last 24 hours) at 05/01/2022 1225 Last data filed at 05/01/2022 0600 Gross per 24 hour  Intake 1320 ml  Output 0 ml  Net 1320 ml     Additional Objective Labs: Basic Metabolic Panel: Recent Labs  Lab 04/24/22 1542 04/27/22 0240 04/29/22 1441  NA 138 136 138  K 5.8* 2.8* 6.0*  CL 99 96* 99  CO2 26 29 25   GLUCOSE 113* 96 85  BUN 59* 14 57*  CREATININE 9.29* 2.32* 9.07*  CALCIUM 9.3 8.5* 9.9  PHOS 6.0* 1.1* 5.7*    Liver Function Tests: Recent Labs  Lab 04/24/22 1542 04/27/22 0240 04/29/22 1441  ALBUMIN 3.3* 3.4* 3.3*    CBC: Recent Labs  Lab 04/24/22 1542 04/29/22 1441  WBC 6.7 6.4  NEUTROABS 4.1  --   HGB 8.5* 8.3*  HCT 28.0* 29.3*  MCV 94.3 96.7  PLT 179 190     Medications:  anticoagulant sodium citrate     sodium thiosulfate 12.5 g in sodium chloride 0.9 % 150 mL Infusion for Calciphylaxis Stopped (04/29/22 1746)   sodium thiosulfate 12.5 g in sodium chloride 0.9 % 150 mL Infusion for Calciphylaxis      apixaban  2.5 mg Oral BID   aspirin EC  81 mg Oral Daily   atorvastatin  10 mg Oral Daily   darbepoetin (ARANESP) injection - DIALYSIS  200 mcg Subcutaneous Q Tue-1800   docusate sodium  100 mg Oral BID   famotidine  20 mg Oral  Daily   feeding supplement  1 Container Oral TID BM   fentaNYL  1 patch Transdermal Once   [START ON 05/03/2022] fentaNYL  1 patch Transdermal Q72H   gabapentin  100 mg Oral QHS   Gerhardt's butt cream   Topical BID   heparin sodium (porcine)  2,500 Units Intravenous Once   levothyroxine  100 mcg Oral Q0600   midodrine  10 mg Oral Once per day on Tue Thu Sat   pantoprazole  40 mg Oral Daily   pregabalin  50 mg Oral Daily   sevelamer carbonate  4.8 g Oral TID WC   sucroferric oxyhydroxide  500 mg Oral TID WC   tobramycin   Left Eye TID    Dialysis Orders: Debility: Has been unable to tolerate dialysis in recliner and therefore unable to discharge to outpatient dialysis. Per her report has lost >50 lbs during admission. Sacral decub ulcers remain a barrier. She now has new powerchair/cushion and has been sitting for longer periods.  Appreciate efforts from PT team.   ESRD: Was usual TTS schedule .  Refusing HD at times. Back on  schedule. Next HD 1/25. Continue low K bath  HD access: new TDC placed 02/04/2022, TDC stopped working again 11/30. IR consulted - S/p venogram, angioplasty of SVC and innominate veins, and TDC exchange. No recent issues reported.  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 Started empirically on sodium thiosulfate, but caused vomiting so stopped - now getting 1/2 dose - 12.5mg  q HD, refuses increased dose. Not on any calcium based products or vitamin D. HTN /volume: On midodrine 10mg  qHD, with 10mg  prn mid HD w/parameters to keep SBP >100 d/t becoming symptomatic when it drops lower. Significant weight loss this admit Severe ischial sacral decubitus wound: CT showing chronic osteomyelitis. Limiting her, noted to be slow healing. Goal to tolerate sitting upright - which she is doing with PT.  COPD/Chronic O2 use: Prior O2 at home (5L/min) -> tapered to 2-3L/min here Anemia of ESRD:  Hgb 8.3 on max dose Aranesp 243mcg q Tues. Recent Fe load completed.  Secondary hyperparathyroidism: On two binders. CorrCa elevated. Not on VDRA, last PTH 86 (low) on 03/11/22. Avoid all calcium based medications for Vitamin D d/t concern for calciphylaxis.  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 Nutrition -ALB 3.3, diet continues to be changed from renal.  Currently on regular diet.  Would really prefer she be on a renal diet but patient has been refusing Disposition: Difficult situation. Prior to admit, patient required 6 staff members and 2 hoyer pads for transfer into HD chair. Could not stay on HD for full tx. 2/2 pain of multiple decubitus ulcers and PTAR unable to lift pt into ambulance for transport.SW working on LTC at [employer]. Will need to tolerate 5-6hrs in a chair to tolerate OP dialysis including transport, treatment and cannulation time.  The main issue is her Rt ischial wound. She now has new power chair and cushion. She has been sitting in the chair for longer periods. Up to 4 hours past week.   [name] PA-C [facility] 05/01/2022,12:25 PM

## 2022-05-01 NOTE — Progress Notes (Signed)
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    PT Comments    Eager to work with PT and get OOB to power w/c. Initially planned transfer training from w/c to recliner however this is too low to complete with current height of w/c. Would likely tolerate A/P transfers from w/c to and from HD recliner although frequent transfers risk wound integrity due to shearing forces. A maxi-lift used with bil amputee sling would reduce risks however this option is currently unavailable.  Pt was able to donne shorts today with min assist. Allowed for supervised transfer into power chair without use of bed pad.   She is nearly independent with A/P bed<> power w/c transfers but needs some help for set-up and supervision for safety until she can verbalize set-up directions for daughter to align power w/c with bed. Pt feels confident that together, with her daughter, they can adequately complete dressing changes and donning/doffing clothes, but requests OT to work on training patient with transfers and use of BSC. She is still looking for housing that is handicap accessible.    Recommendations for follow up therapy are one component of a multi-disciplinary discharge planning process, led by the attending physician.  Recommendations may be updated based on patient status, additional functional criteria and insurance authorization.  Follow Up Recommendations  Home health PT (consider PACE services vs SNF pending housing) Can patient physically be transported by private vehicle: No    Assistance Recommended at Discharge Intermittent Supervision/Assistance  Patient can return home with the following A little help with walking and/or transfers;Help with stairs or ramp for entrance;Assist for transportation (medical [name] Lei transport)   Financial risk analyst (measurements PT);Wheelchair cushion (measurements PT);Other (comment);Hospital bed (more accomodating mechanical lift, with sling that handles pt's with bil AKAs;  cushion (20x24), power wheelchair with power tilt and recline)    Recommendations for Other Services       Precautions / Restrictions Precautions Precautions: Fall Precaution Comments: bil AKA, 2L O2 at baseline (has been tolerating mobility in power chair on [employer] fine); when in power chair, MUST perform pressure relief every 30 minutes by tilting back for 10 minutes Restrictions Weight Bearing Restrictions: No RUE Weight Bearing: Weight bearing as tolerated LUE Weight Bearing: Weight bearing as tolerated RLE Weight Bearing: Non weight bearing LLE Weight Bearing: Non weight bearing Other Position/Activity Restrictions: when in power chair, MUST perform pressure relief every 30 minutes by tilting back for 10 minutes     Mobility  Bed Mobility Overal bed mobility: Modified Independent             General bed mobility comments: No assist required with bed mobility, takes extra time.    Transfers Overall transfer level: Needs assistance Equipment used: None (Pt had on shorts) Transfers: Bed to chair/wheelchair/BSC         Anterior-Posterior transfers: Supervision   General transfer comment: Posterior scoot, set-up provided for bed and chair positioning. No physical assist to scoot, cues to use UE strength to reduce pressure on buttocks.    Ambulation/Gait  General Gait Details: unable   Theme park manager propulsion:  (Power) Wheelchair  Assistance Details (indicate cue type and reason): Ind. parked at nursing station.  Modified Rankin (Stroke Patients Only)       Balance Overall balance assessment: Modified Independent Sitting-balance support: No upper extremity supported Sitting balance-[name] Scale: Good Sitting balance - Comments: Able to sit unsupported in bed and powerchair without difficulties                                    Cognition Arousal/Alertness: Awake/alert Behavior During Therapy: WFL for tasks assessed/performed Overall Cognitive Status: Within Functional Limits for tasks assessed                                          Exercises General Exercises - Upper Extremity Chair Push Up: Strengthening, Both, 10 reps, Seated    General Comments General comments (skin integrity, edema, etc.): Assisted pt with donning/doffing shorts. Education for rolling residual material and prevent material from getting caught in power-chair. Troubleshooting ideas about transfers to different surfaces, willing to work with OT on transfers to [facility] and performing her own pericare - states she and her daughter are ind with dressing changes.      Pertinent Vitals/Pain Pain Assessment Pain Assessment: Faces Faces Pain Scale: Hurts little more Pain Location: R abdomen Pain Descriptors / Indicators: Discomfort, Guarding Pain Intervention(s): Monitored during session, Premedicated before session, Limited activity within patient's tolerance    Home Living                          Prior Function            PT Goals (current goals can now be found in the care plan section) Acute Rehab PT Goals Patient Stated Goal: Reports wants to be up in power chair and be more independent PT Goal Formulation: With patient Time For Goal Achievement: 05/15/22 Potential to Achieve Goals: Good Progress towards PT goals: Progressing toward goals    Frequency    Min 3X/week      PT  Plan Current plan remains appropriate (pt and her daughter must work on finding accessible housing)    Co-evaluation              AM-PAC PT "6 Clicks" Mobility   Outcome Measure  Help needed turning from your back to your side while in a flat bed without using bedrails?: None Help needed moving from lying on your back to sitting on the side of a flat bed without using bedrails?: None Help needed moving to and from a bed to a chair (including a wheelchair)?: A Little Help needed standing up from a chair using your arms (e.g., wheelchair or bedside chair)?: Total Help needed to walk in hospital room?: Total Help needed climbing 3-5 steps with a railing? : Total 6 Click Score: 14    End of Session     Patient left: with call bell/phone within reach;in chair (Power-chair, seat belt on, parked at NS) Nurse Communication: Mobility status PT Visit Diagnosis: Other abnormalities of gait and mobility (R26.89);Muscle weakness (generalized) (M62.81) Pain - Right/Left: Right Pain - part of body:  (R abdomen)  Time: 0354-6568 PT Time Calculation (min) (ACUTE ONLY): 31 min  Charges:  $Therapeutic Activity: 8-22 mins $Self Care/Home Management: 8-22                     Candie Mile, PT, DPT Physical Therapist Acute Rehabilitation Services [place]    Ellouise Newer 05/01/2022, 10:16 AM

## 2022-05-01 NOTE — Progress Notes (Signed)
Mobility Specialist Progress Note:   05/01/22 1130  Mobility  Activity Transferred from chair to bed (powerchair to bed)  Level of Assistance Standby assist, set-up cues, supervision of patient - no hands on  Assistive Device None  Activity Response Tolerated well  Mobility Referral Yes  $Mobility charge 1 Mobility   Pt requesting to transfer back to bed. Pt able to anterior scoot to bed with no physical assistance. Left with all needs met.  Nelta Numbers Mobility Specialist Please contact via SecureChat or  Rehab office at [phone]

## 2022-05-01 NOTE — Progress Notes (Addendum)
Received patient in bed to unit.  Alert and oriented.  Informed consent signed and in chart.   Treatment initiated: 1424 Treatment completed: 1713  Patient tolerated well.  Transported back to the room  Alert, without acute distress.  Hand-off given to patient's nurse.   Access used: Catheter Access issues: none  Total UF removed: 2100 Medication(s) given: none Post HD VS: 98.8, 164/87(103), HR-65, RR-12, SP02-98 Post HD weight: 117.6kg   Pt signed off treatment early due to wound pain and eye pain. [facility]

## 2022-05-01 NOTE — TOC Progression Note (Signed)
Transition of Care [facility]) - Initial/Assessment Note    Patient Details  Name: Courtney Gray MRN: [id number] Date of Birth: 04/17/1970  Transition of Care [facility]) CM/SW Contact:    Milinda Antis, LCSWA Phone Number: 05/01/2022, 10:49 AM  Clinical Narrative:                 LCSW met with patient. Patient expressed that she is waiting for a "housing list" to give to her daughter because they "know nothing" about the [place] area.  The patient was in good spirits and in her tilt wheel chair.  TOC will provide housing list.    Expected Discharge Plan: Long Term Nursing Home Barriers to Discharge: No SNF bed, Transportation, Waiting for outpatient dialysis   Patient Goals and CMS Choice Patient states their goals for this hospitalization and ongoing recovery are:: To be at a facility close to her daughter          Expected Discharge Plan and Services       Living arrangements for the past 2 months: Single Family Home                                      Prior Living Arrangements/Services Living arrangements for the past 2 months: Single Family Home Lives with:: Adult Children Patient language and need for interpreter reviewed:: Yes        Need for Family Participation in Patient Care: Yes (Comment) Care giver support system in place?: No (comment)   Criminal Activity/Legal Involvement Pertinent to Current Situation/Hospitalization: No - Comment as needed  Activities of Daily Living Home Assistive Devices/Equipment: Civil Service fast streamer ADL Screening (condition at time of admission) Patient's cognitive ability adequate to safely complete daily activities?: No Is the patient deaf or have difficulty hearing?: No Does the patient have difficulty seeing, even when wearing glasses/contacts?: No Does the patient have difficulty concentrating, remembering, or making decisions?: No Patient able to express need for assistance with ADLs?: Yes Does the patient have difficulty  dressing or bathing?: Yes Independently performs ADLs?: No Does the patient have difficulty walking or climbing stairs?: Yes Weakness of Legs: Both Weakness of Arms/Hands: Both  Permission Sought/Granted Permission sought to share information with : Other (comment) Permission granted to share information with : Yes, Verbal Permission Granted  Share Information with NAME: Sacks,antoinette (Daughter)   [phone]  Permission granted to share info w AGENCY: SNF        Emotional Assessment Appearance:: Appears older than stated age Attitude/Demeanor/Rapport: Engaged Affect (typically observed): Tearful/Crying, Accepting Orientation: : Oriented to Situation, Oriented to  Time, Oriented to Place, Oriented to Self Alcohol / Substance Use: Not Applicable Psych Involvement: No (comment)  Admission diagnosis:  End stage renal disease ([place]) [N18.6] Fluid overload [E87.70] Obesity with serious comorbidity, unspecified classification, unspecified obesity type [E66.9] Pressure injury of right buttock, stage 2 ([place]) [L89.312] Patient Active Problem List   Diagnosis Date Noted   Nodule of skin of abdomen 02/16/2022   Acute on chronic systolic CHF (congestive heart failure) ([place]) 02/11/2022   Acute on chronic diastolic CHF (congestive heart failure) ([place]) 12/12/2021   PAD (peripheral artery disease) ([place]) 12/12/2021   Irritable bowel syndrome (IBS) 12/12/2021   Amputation of left middle finger 12/12/2021   GERD (gastroesophageal reflux disease) 12/12/2021   Hypertension    Type 2 diabetes mellitus with hyperlipidemia ([place])    Decubitus ulcer of sacral region, stage 3 ([place])  Class 3 obesity (HCC)    Bacteremia    PVD (peripheral vascular disease) ([place])    Hepatic abscess    Pressure injury of skin 09/13/2021   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[place])    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   Lactic acidosis 09/10/2021   Mixed diabetic hyperlipidemia associated with type 2 diabetes  mellitus ([place]) 09/10/2021   Coronary artery disease involving native coronary artery of native heart without angina pectoris 09/10/2021   Elevated troponin level not due myocardial infarction 09/10/2021   Hypotension 09/10/2021   Dry gangrene (HCC)    Pain 08/03/2021   Ischemic foot pain at rest 08/03/2021   Hypoglycemia 07/10/2021   Volume overload 07/09/2021   Leukocytosis 06/11/2021   Acute gout 06/11/2021   Hyperkalemia 05/21/2021   Hemorrhoids    Hyperlipidemia 05/18/2021   Rectal bleeding 05/17/2021   Morbid obesity with BMI of 60.0-69.9, adult ([place]) 05/17/2021   NSTEMI (non-ST elevated myocardial infarction) ([place]) 05/16/2021   Essential hypertension 09/01/2020   End stage renal disease ([place]) 09/01/2020   Chronic respiratory failure with hypoxia ([place]) 09/01/2020   Hypertensive emergency    Hypertensive urgency 11/28/2019   Uncontrolled type 2 diabetes mellitus with hypoglycemia, with long-term current use of insulin ([place]) 11/28/2019   Anemia due to chronic kidney disease 11/28/2019   PCP:  Monico Blitz, MD Pharmacy:   [facility], [place] [address] [place] [address] Phone: [phone] Fax: [phone]  [facility] [address] [place] [address] Phone: [phone] Fax: [phone]  [employer], [place] - [address] [place] [address] Phone: [phone] Fax: [phone]     Social Determinants of Health (SDOH) Social History: [place]: No Food Insecurity (12/11/2021)  Housing: Low Risk  (12/11/2021)  Transportation Needs: No Transportation Needs (12/11/2021)  Utilities: At Risk (12/11/2021)  Tobacco Use: Low Risk  (03/07/2022)   SDOH Interventions:     Readmission Risk Interventions    09/16/2021    4:36 PM 08/28/2021   11:50 AM 07/12/2021   12:38 PM  Readmission Risk Prevention Plan  Transportation  Screening Complete Complete Complete  Medication Review Press photographer)  Complete Complete  PCP or Specialist appointment within 3-5 days of discharge  Complete Complete  HRI or Home Care Consult Complete Complete Complete  SW Recovery Care/Counseling Consult Complete Complete Complete  Palliative Care Screening  Not Applicable Not Applicable  Skilled Nursing Facility Complete Not Applicable Complete

## 2022-05-01 NOTE — Progress Notes (Addendum)
PROGRESS NOTE    Courtney Gray  ION:[id number] DOB: 21-Jan-1971 DOA: 11/01/2021 PCP: Monico Blitz, MD   Brief Narrative: 52 year old with past medical history significant for ESRD on hemodialysis TTS, recent hospitalization for bacteremia, hepatic and splenic abscess on long-term IV antibiotics, diastolic heart failure, PVD status post bilateral AKA, insulin-dependent diabetes type 2, morbid obesity and chronic sacral wound presents with progressive shortness of breath in the setting of missing hemodialysis and admitted for hypertensive emergency. Patient with long length of stay, discharge pending due to ability to obtain outpatient hemodialysis that can accumulate patient's mobility constraints secondary to decubitus ulcer wound and bilateral AKA.  Patient will need to be able to seat HD chair to be able to get out patient HD set up. Hospital HD unit is working on getting bariatric HD chair to be able to accommodate patient.     Assessment & Plan:   Active Problems:   End stage renal disease ([place])   Hypertension   Decubitus ulcer of sacral region, stage 3 (HCC)   Type 2 diabetes mellitus with hyperlipidemia (HCC)   Chronic respiratory failure with hypoxia (HCC)   Bacteremia   PVD (peripheral vascular disease) (HCC)   Class 3 obesity (HCC)   Anemia due to chronic kidney disease   Hypertensive emergency   Leukocytosis   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HCC)   Pressure injury of skin   PAD (peripheral artery disease) (HCC)   Irritable bowel syndrome (IBS)   Amputation of left middle finger   GERD (gastroesophageal reflux disease)   Nodule of skin of abdomen   1-ESRD, HD: -Nephrology Following, TTS schedule. - Needs Bariatric HD chair in the unit to accommodate patient.  -Working on tolerance sitting in the chair -Continue with midodrine.  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  She is now has been off of oral antibiotics. -Patient declined antibiotics, due to concern for GI issues.  She will continue with wound care. -Doxycycline was discontinued 12/1 Local care.  On fentanyl patch and  Oxycodone for PRN pain.  Sacral decubitus evaluated today, wound looks clean, gauze with drainage, but wound was not actively draining. Will ask Wound care to follow up on wound. Patient asking for more frequent dressing changes to decubitus wound due to drainage.  See wound picture in media.   3-Calciphylaxis: Abdominal wound.  Na-thiosulfate  per nephrology As needed oxycodone Continue with wound care.  She declines further imagines.   4-Hypotension during HD: Continue with midodrine on hemodialysis days Monitor BP.   Phantom limb pain: Continue with Lyrica, gabapentin.   Hyperkalemia; correction with HD.  Decubitus ulcer of sacral region stage III: Continue with wound care Hypothyroidism: Continue with Synthroid Diabetes type 2 with hyperlipidemia neuropathy: Continue with aspirin and statins Bacteremia: Patient has completed antibiotic therapy   PVD (peripheral vascular disease) ([place]) History of Bilateral AKA   Class 3 obesity (HCC) BMI >40    Chronic respiratory failure Due to morbid obesity  Constipation; PRN Dulcolax suppository    Pressure Injury 11/12/21 Hip Right Unstageable - Full thickness tissue loss in which the base of the injury is covered by slough (yellow, tan, gray, green or brown) and/or eschar (tan, brown or black) in the wound bed. 90% covered with slough (Active)  11/12/21 0800  Location: Hip  Location Orientation: Right  Staging: Unstageable - Full thickness tissue loss in which the base of the injury  is covered by slough (yellow, tan, gray, green or brown) and/or eschar (tan, brown or black) in the wound bed.  Wound Description (Comments): 90% covered with slough  Present on Admission: Yes  Dressing Type Foam -  Lift dressing to assess site every shift;Gauze (Comment);Silver hydrofiber 04/29/22 2206     Pressure Injury 11/24/21 Flank Left;Lower Stage 2 -  Partial thickness loss of dermis presenting as a shallow open injury with a red, pink wound bed without slough. (Active)  11/24/21 0430  Location: Flank  Location Orientation: Left;Lower  Staging: Stage 2 -  Partial thickness loss of dermis presenting as a shallow open injury with a red, pink wound bed without slough.  Wound Description (Comments):   Present on Admission: No  Dressing Type Foam - Lift dressing to assess site every shift 05/01/22 1300      Estimated body mass index is 43.91 kg/m as calculated from the following:   Height as of this encounter: 5\' 5"  (1.651 m).   Weight as of this encounter: 119.7 kg.   DVT prophylaxis: Aspirin Code Status: Full code Family Communication: Care discussed with  patient Disposition Plan:  Status is: Inpatient Remains inpatient appropriate because: Awaiting out patient HD set up, she needs to be able to sit for HD    Consultants:  Nephrology ID   Procedures:    Antimicrobials:    Subjective: I evaluated her decubitus and lateral abdomen wound today, with assistance of nurse.  No significant drainage notice from wound, does not appears infected.    Objective: Vitals:   05/01/22 1530 05/01/22 1600 05/01/22 1630 05/01/22 1700  BP: (!) 114/92 (!) (P) 138/47 (!) (P) 152/59 (!) (P) 158/65  Pulse:      Resp: (!) 22     Temp:      TempSrc:      SpO2:      Weight:      Height:        Intake/Output Summary (Last 24 hours) at 05/01/2022 1722 Last data filed at 05/01/2022 0600 Gross per 24 hour  Intake 960 ml  Output 0 ml  Net 960 ml    Filed Weights   04/24/22 1528 04/24/22 1935 05/01/22 1405  Weight: 121.3 kg 118.3 kg 119.7 kg    Examination:  General exam:NAD Respiratory system: CTA Cardiovascular system: S 1, S 2 RRR Gastrointestinal system: BS present, soft,  nt Central nervous system: Alert, follows command Extremities: BL AKA Skin: Multiples wound  Data Reviewed: I have personally reviewed following labs and imaging studies  CBC: Recent Labs  Lab 04/29/22 1441  WBC 6.4  HGB 8.3*  HCT 29.3*  MCV 96.7  PLT 161    Basic Metabolic Panel: Recent Labs  Lab 04/27/22 0240 04/29/22 1441  NA 136 138  K 2.8* 6.0*  CL 96* 99  CO2 29 25  GLUCOSE 96 85  BUN 14 57*  CREATININE 2.32* 9.07*  CALCIUM 8.5* 9.9  PHOS 1.1* 5.7*    GFR: Estimated Creatinine Clearance: 9.5 mL/min (A) (by C-G formula based on SCr of 9.07 mg/dL (H)). Liver Function Tests: Recent Labs  Lab 04/27/22 0240 04/29/22 1441  ALBUMIN 3.4* 3.3*    No results for input(s): "LIPASE", "AMYLASE" in the last 168 hours. No results for input(s): "AMMONIA" in the last 168 hours. Coagulation Profile: No results for input(s): "INR", "PROTIME" in the last 168 hours. Cardiac Enzymes: No results for input(s): "CKTOTAL", "CKMB", "CKMBINDEX", "TROPONINI" in the last 168 hours. BNP (last 3 results) No  results for input(s): "PROBNP" in the last 8760 hours. HbA1C: No results for input(s): "HGBA1C" in the last 72 hours. CBG: No results for input(s): "GLUCAP" in the last 168 hours. Lipid Profile: No results for input(s): "CHOL", "HDL", "LDLCALC", "TRIG", "CHOLHDL", "LDLDIRECT" in the last 72 hours. Thyroid Function Tests: No results for input(s): "TSH", "T4TOTAL", "FREET4", "T3FREE", "THYROIDAB" in the last 72 hours. Anemia Panel: No results for input(s): "VITAMINB12", "FOLATE", "FERRITIN", "TIBC", "IRON", "RETICCTPCT" in the last 72 hours. Sepsis Labs: No results for input(s): "PROCALCITON", "LATICACIDVEN" in the last 168 hours.  No results found for this or any previous visit (from the past 240 hour(s)).       Radiology Studies: No results found.      Scheduled Meds:  apixaban  2.5 mg Oral BID   aspirin EC  81 mg Oral Daily   atorvastatin  10 mg Oral Daily    darbepoetin (ARANESP) injection - DIALYSIS  200 mcg Subcutaneous Q Tue-1800   docusate sodium  100 mg Oral BID   famotidine  20 mg Oral Daily   feeding supplement  1 Container Oral TID BM   fentaNYL  1 patch Transdermal Once   [START ON 05/03/2022] fentaNYL  1 patch Transdermal Q72H   gabapentin  100 mg Oral QHS   Gerhardt's butt cream   Topical BID   heparin sodium (porcine)       heparin sodium (porcine)  2,500 Units Intravenous Once   levothyroxine  100 mcg Oral Q0600   midodrine  10 mg Oral Once per day on Tue Thu Sat   pantoprazole  40 mg Oral Daily   pregabalin  50 mg Oral Daily   sevelamer carbonate  4.8 g Oral TID WC   sucroferric oxyhydroxide  500 mg Oral TID WC   tobramycin   Left Eye TID   Continuous Infusions:  anticoagulant sodium citrate     sodium thiosulfate 12.5 g in sodium chloride 0.9 % 150 mL Infusion for Calciphylaxis Stopped (04/29/22 1746)   sodium thiosulfate 12.5 g in sodium chloride 0.9 % 150 mL Infusion for Calciphylaxis       LOS: 181 days    Time spent: 35 minutes    Maelyn Berrey A Jizel Cheeks, MD [facility]   If 7PM-7AM, please contact night-coverage [email or web address]  05/01/2022, 5:22 PM

## 2022-05-02 DIAGNOSIS — N186 End stage renal disease: Secondary | ICD-10-CM | POA: Diagnosis not present

## 2022-05-02 LAB — BASIC METABOLIC PANEL
Anion gap: 12 (ref 5–15)
BUN: 31 mg/dL — ABNORMAL HIGH (ref 6–20)
CO2: 26 mmol/L (ref 22–32)
Calcium: 9.7 mg/dL (ref 8.9–10.3)
Chloride: 96 mmol/L — ABNORMAL LOW (ref 98–111)
Creatinine, Ser: 6 mg/dL — ABNORMAL HIGH (ref 0.44–1.00)
GFR, Estimated: 8 mL/min — ABNORMAL LOW (ref >60)
Glucose, Bld: 78 mg/dL (ref 70–99)
Potassium: 5.3 mmol/L — ABNORMAL HIGH (ref 3.5–5.1)
Sodium: 134 mmol/L — ABNORMAL LOW (ref 135–145)

## 2022-05-02 MED ORDER — SODIUM ZIRCONIUM CYCLOSILICATE 5 G PO PACK
5.0000 g | PACK | Freq: Once | ORAL | Status: AC
  Administered 2022-05-02: 5 g via ORAL
  Filled 2022-05-02: qty 1

## 2022-05-02 NOTE — Consult Note (Signed)
WOC consulted to discuss change frequency with patient. She is concerned about the frequency the abdominal wounds are being changed. We agreed and I will update this wound care order to read PRN patient's request.  No harm in leaving xeroform in place for several days.   Updated orders for the right buttock wound per patient's request to have staff attempt to keep silicone foam border out of the patient's gluteal cleft as much as possible by off centering foam dressing. Discussed available sizes of foam with patient and options for placement.   Re consult if needed, will not follow at this time. Thanks  [place] Mielke [employer], RN,CWOCN, CNS, [place] [phone])

## 2022-05-02 NOTE — Progress Notes (Signed)
Physical Therapy Treatment Patient Details Name: Courtney Gray MRN: [id number] DOB: 12-Sep-1970 Today's Date: 05/02/2022   History of Present Illness Pt adm 7/28 with acute pulmonary edema; large sacral ulcer limiting sitting tolerance which makes SNF placement difficult for HD; R flank wound making demo wheelchair painful where R side meets armrest; PMH - Bil AKA, ESRD on HD, HTN, DM, sacral wound, PVD, CHF, Splenic infarct with drain placment, lt 3rd finger amputation, gout.    PT Comments    Pt declines OOB activity due to stye, disrupting her vision and wanting to remain safe if driving power wheelchair. She was agreeable to robust therapeutic exercise session however. Tolerated well, explained benefits and need for continued (daily) performance to maximize functional independence. Addressed additional concerns regarding mobility, safety, ADLs once d/c; resources for continued PT and eventual prosthesis fitting. Patient will continue to benefit from skilled physical therapy services to further improve independence with functional mobility.    Recommendations for follow up therapy are one component of a multi-disciplinary discharge planning process, led by the attending physician.  Recommendations may be updated based on patient status, additional functional criteria and insurance authorization.  Follow Up Recommendations  Home health PT (consider PACE services vs SNF pending housing) Can patient physically be transported by private vehicle: No   Assistance Recommended at Discharge Intermittent Supervision/Assistance  Patient can return home with the following A little help with walking and/or transfers;Help with stairs or ramp for entrance;Assist for transportation (medical [name] Lei transport)   Financial risk analyst (measurements PT);Wheelchair cushion (measurements PT);Other (comment);Hospital bed (more accomodating mechanical lift, with sling that handles pt's with bil AKAs;   cushion (20x24), power wheelchair with power tilt and recline)    Recommendations for Other Services       Precautions / Restrictions Precautions Precautions: Fall Precaution Comments: bil AKA, 2L O2 at baseline (has been tolerating mobility in power chair on [employer] fine); when in power chair, MUST perform pressure relief every 30 minutes by tilting back for 10 minutes Restrictions Weight Bearing Restrictions: No RUE Weight Bearing: Weight bearing as tolerated LUE Weight Bearing: Weight bearing as tolerated RLE Weight Bearing: Non weight bearing LLE Weight Bearing: Non weight bearing Other Position/Activity Restrictions: when in power chair, MUST perform pressure relief every 30 minutes by tilting back for 10 minutes     Mobility  Bed Mobility Overal bed mobility: Modified Independent             General bed mobility comments: extra time, no assist, able to lie in modified prone position for exercises. Full prone limited by habitus.    Transfers                        Ambulation/Gait               General Gait Details: unable   [name], [name] (indicate cue type and reason): Deferred due to Lt eye swelling  Modified Rankin (Stroke Patients Only)       Balance Overall balance assessment: Modified Independent Sitting-balance support: No upper extremity supported Sitting balance-[name] Scale: Good Sitting balance - Comments: Able to sit unsupported in bed and powerchair without difficulties                                    Cognition  Arousal/Alertness: Awake/alert Behavior During Therapy: WFL for tasks assessed/performed Overall Cognitive Status: Within Functional Limits for tasks assessed                                          Exercises Amputee Exercises Gluteal Sets: Strengthening, Both, 15 reps, Supine Towel Squeeze:  Strengthening, Both, 15 reps, Supine Hip Extension: Both, Strengthening, 15 reps, Prone (semi-side) Hip ABduction/ADduction: Both, Sidelying, Strengthening, 15 reps Hip Flexion/Marching: Both, Strengthening, 15 reps, Supine Chair Push Up: Both, Strengthening, Seated, 10 reps (on pillows, 2 sec hold in extension)    General Comments General comments (skin integrity, edema, etc.): Further education for mobility, skin checks, exercise importance if wishing to progress to prosthetic fitting some day.      Pertinent Vitals/Pain Pain Assessment Pain Assessment: Faces Faces Pain Scale: Hurts little more Pain Location: R abdomen Pain Descriptors / Indicators: Discomfort, Guarding Pain Intervention(s): Monitored during session, Repositioned    Home Living                          Prior Function            PT Goals (current goals can now be found in the care plan section) Acute Rehab PT Goals Patient Stated Goal: Reports wants to be up in power chair and be more independent PT Goal Formulation: With patient Time For Goal Achievement: 05/15/22 Potential to Achieve Goals: Good Progress towards PT goals: Progressing toward goals    Frequency    Min 3X/week      PT Plan Current plan remains appropriate (pt and her daughter must work on finding accessible housing)    Co-evaluation              AM-PAC PT "6 Clicks" Mobility   Outcome Measure  Help needed turning from your back to your side while in a flat bed without using bedrails?: None Help needed moving from lying on your back to sitting on the side of a flat bed without using bedrails?: None Help needed moving to and from a bed to a chair (including a wheelchair)?: A Little Help needed standing up from a chair using your arms (e.g., wheelchair or bedside chair)?: Total Help needed to walk in hospital room?: Total Help needed climbing 3-5 steps with a railing? : Total 6 Click Score: 14    End of Session  Equipment Utilized During Treatment: Other (comment) (bolsters) Activity Tolerance: Patient tolerated treatment well Patient left: with call bell/phone within reach;in bed;with bed alarm set Nurse Communication: Mobility status PT Visit Diagnosis: Other abnormalities of gait and mobility (R26.89);Muscle weakness (generalized) (M62.81) Pain - Right/Left: Right Pain - part of body:  (R abdomen)     Time: 1610-9604 PT Time Calculation (min) (ACUTE ONLY): 35 min  Charges:  $Therapeutic Exercise: 8-22 mins $Self Care/Home Management: Norge, PT, DPT Physical Therapist Acute Rehabilitation Services [place]    Ellouise Newer 05/02/2022, 11:58 AM

## 2022-05-02 NOTE — Progress Notes (Signed)
[facility] Progress Note   Subjective: No C/Os. Very pleasant and talkative. HD tomorrow on schedule.   Objective Vitals:   05/01/22 2101 05/02/22 0500 05/02/22 0531 05/02/22 0920  BP: (!) 140/60  (!) 153/89 122/67  Pulse: (!) 54  60 61  Resp: 18  18   Temp: 98.4 F (36.9 C)  98.1 F (36.7 C) 98.4 F (36.9 C)  TempSrc: Oral  Oral Oral  SpO2: 100%  100% 100%  Weight:  117.9 kg    Height:       Physical Exam General: Obese female in NAD Heart: S1,S2 HS distant No M/R/G Lungs: CTAB A/P Abdomen: obese NABS Extremities: bilateral AKA Dialysis Access: [facility] drsg CDI  Additional Objective Labs: Basic Metabolic Panel: Recent Labs  Lab 04/27/22 0240 04/29/22 1441 05/02/22 0826  NA 136 138 134*  K 2.8* 6.0* 5.3*  CL 96* 99 96*  CO2 29 25 26   GLUCOSE 96 85 78  BUN 14 57* 31*  CREATININE 2.32* 9.07* 6.00*  CALCIUM 8.5* 9.9 9.7  PHOS 1.1* 5.7*  --    Liver Function Tests: Recent Labs  Lab 04/27/22 0240 04/29/22 1441  ALBUMIN 3.4* 3.3*   No results for input(s): "LIPASE", "AMYLASE" in the last 168 hours. CBC: Recent Labs  Lab 04/29/22 1441  WBC 6.4  HGB 8.3*  HCT 29.3*  MCV 96.7  PLT 190   Blood Culture    Component Value Date/Time   SDES BLOOD LEFT ANTECUBITAL 10/16/2021 1534   SPECREQUEST  10/16/2021 1534    BOTTLES DRAWN AEROBIC AND ANAEROBIC Blood Culture results may not be optimal due to an inadequate volume of blood received in culture bottles   CULT  10/16/2021 1534    NO GROWTH 5 DAYS Performed at [facility], [place] [address] [place], [place] [address]    REPTSTATUS 10/21/2021 FINAL 10/16/2021 1534    Cardiac Enzymes: No results for input(s): "CKTOTAL", "CKMB", "CKMBINDEX", "TROPONINI" in the last 168 hours. CBG: No results for input(s): "GLUCAP" in the last 168 hours. Iron Studies: No results for input(s): "IRON", "TIBC", "TRANSFERRIN", "FERRITIN" in the last 72 hours. @lablastinr3 @ Studies/Results: No results  found. Medications:  sodium thiosulfate 12.5 g in sodium chloride 0.9 % 150 mL Infusion for Calciphylaxis Stopped (04/29/22 1746)   sodium thiosulfate 12.5 g in sodium chloride 0.9 % 150 mL Infusion for Calciphylaxis      apixaban  2.5 mg Oral BID   aspirin EC  81 mg Oral Daily   atorvastatin  10 mg Oral Daily   darbepoetin (ARANESP) injection - DIALYSIS  200 mcg Subcutaneous Q Tue-1800   docusate sodium  100 mg Oral BID   famotidine  20 mg Oral Daily   feeding supplement  1 Container Oral TID BM   fentaNYL  1 patch Transdermal Once   [START ON 05/03/2022] fentaNYL  1 patch Transdermal Q72H   gabapentin  100 mg Oral QHS   Gerhardt's butt cream   Topical BID   heparin sodium (porcine)  2,500 Units Intravenous Once   levothyroxine  100 mcg Oral Q0600   midodrine  10 mg Oral Once per day on Tue Thu Sat   pantoprazole  40 mg Oral Daily   pregabalin  50 mg Oral Daily   sevelamer carbonate  4.8 g Oral TID WC   sucroferric oxyhydroxide  500 mg Oral TID WC   tobramycin   Left Eye TID     Dialysis Orders: Debility: Has been unable to tolerate dialysis  in recliner and therefore unable to discharge to outpatient dialysis. Per her report has lost >50 lbs during admission. Sacral decub ulcers remain a barrier. She now has new powerchair/cushion and has been sitting for longer periods.  Appreciate efforts from PT team.   ESRD: Was usual TTS schedule .  Refusing HD at times. Back on schedule. Next HD 05/03/2022. 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 Started empirically on sodium thiosulfate, but caused vomiting so stopped - now getting 1/2 dose - 12.5mg  q HD, refuses increased dose. Not on any calcium based products or vitamin D. HTN /volume: On midodrine 10mg  qHD, with 10mg  prn mid HD w/parameters to keep SBP >100 d/t becoming symptomatic when it drops lower. Significant weight loss  this admit Severe ischial sacral decubitus wound: CT showing chronic osteomyelitis. Limiting her, noted to be slow healing. Goal to tolerate sitting upright - which she is doing with PT.  COPD/Chronic O2 use: Prior O2 at home (5L/min) -> tapered to 2-3L/min here Anemia of ESRD: Hgb 8.3 on max dose Aranesp 278mcg q Tues. Recent Fe load completed.  Secondary hyperparathyroidism: On two binders. CorrCa elevated. Not on VDRA, last PTH 86 (low) on 03/11/22. Avoid all calcium based medications for Vitamin D d/t concern for calciphylaxis.  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 Nutrition -ALB 3.3, diet continues to be changed from renal.  Currently on regular diet.  Would really prefer she be on a renal diet but patient has been refusing Disposition: Difficult situation. Prior to admit, patient required 6 staff members and 2 hoyer pads for transfer into HD chair. Could not stay on HD for full tx. 2/2 pain of multiple decubitus ulcers and PTAR unable to lift pt into ambulance for transport.SW working on LTC at [employer]. Will need to tolerate 5-6hrs in a chair to tolerate OP dialysis including transport, treatment and cannulation time.  The main issue is her Rt ischial wound. She now has new power chair and cushion. She has been sitting in the chair for longer periods. Up to 4 hours past week.    Serene Kopf H. Arvel Oquinn NP-C 05/02/2022, 11:48 AM  [employer] [phone]

## 2022-05-02 NOTE — Progress Notes (Signed)
PROGRESS NOTE    Courtney Gray  XBJ:[id number] DOB: 1970/11/30 DOA: 11/01/2021 PCP: Monico Blitz, MD   Brief Narrative: 52 year old with past medical history significant for ESRD on hemodialysis TTS, recent hospitalization for bacteremia, hepatic and splenic abscess on long-term IV antibiotics, diastolic heart failure, PVD status post bilateral AKA, insulin-dependent diabetes type 2, morbid obesity and chronic sacral wound presents with progressive shortness of breath in the setting of missing hemodialysis and admitted for hypertensive emergency. Patient with long length of stay, discharge pending due to ability to obtain outpatient hemodialysis that can accumulate patient's mobility constraints secondary to decubitus ulcer wound and bilateral AKA.  Patient will need to be able to seat HD chair to be able to get out patient HD set up. Hospital HD unit is working on getting bariatric HD chair to be able to accommodate patient.     Assessment & Plan:   Active Problems:   End stage renal disease ([place])   Hypertension   Decubitus ulcer of sacral region, stage 3 (HCC)   Type 2 diabetes mellitus with hyperlipidemia (HCC)   Chronic respiratory failure with hypoxia (HCC)   Bacteremia   PVD (peripheral vascular disease) (HCC)   Class 3 obesity (HCC)   Anemia due to chronic kidney disease   Hypertensive emergency   Leukocytosis   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HCC)   Pressure injury of skin   PAD (peripheral artery disease) (HCC)   Irritable bowel syndrome (IBS)   Amputation of left middle finger   GERD (gastroesophageal reflux disease)   Nodule of skin of abdomen   1-ESRD, HD: -Nephrology Following, TTS schedule. - Needs Bariatric HD chair in the unit to accommodate patient.  -Working on tolerance sitting in the chair -Continue with midodrine.  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  She is now has been off of oral antibiotics. -Patient declined antibiotics, due to concern for GI issues.  She will continue with wound care. -Doxycycline was discontinued 12/1 Local care.  On fentanyl patch and  Oxycodone for PRN pain.  Sacral decubitus evaluated today, wound looks clean, gauze with drainage, but wound was not actively draining. Will ask Wound care to follow up on wound. Patient asking for more frequent dressing changes to decubitus wound due to drainage.  See wound picture in media.   3-Calciphylaxis: Abdominal wound.  Na-thiosulfate  per nephrology As needed oxycodone Continue with wound care.  She declines further imagines.   4-Hypotension during HD: Continue with midodrine on hemodialysis days Monitor BP.   Phantom limb pain: Continue with Lyrica, gabapentin.   Hyperkalemia; correction with HD. Will give a dose of lokelma   Decubitus ulcer of sacral region stage III: Continue with wound care, appreciate wound care recommendations.   Hypothyroidism: Continue with Synthroid Diabetes type 2 with hyperlipidemia neuropathy: Continue with aspirin and statins Bacteremia: Patient has completed antibiotic therapy   PVD (peripheral vascular disease) ([place]) History of Bilateral AKA   Class 3 obesity (HCC) BMI >40    Chronic respiratory failure Due to morbid obesity  Constipation; PRN Dulcolax suppository  Stye: on topical antibiotics, warm compress.   Pressure Injury 11/12/21 Hip Right Unstageable - Full thickness tissue loss in which the base of the injury is covered by slough (yellow, tan, gray, green or brown) and/or eschar (tan, brown or black) in the wound bed. 90% covered with slough (Active)  11/12/21 0800  Location: Hip  Location Orientation: Right  Staging: Unstageable - Full thickness tissue loss in which the base of the injury is covered by slough (yellow, tan, gray, green or brown) and/or eschar (tan, brown or black) in the  wound bed.  Wound Description (Comments): 90% covered with slough  Present on Admission: Yes  Dressing Type Foam - Lift dressing to assess site every shift 05/02/22 1000     Pressure Injury 11/24/21 Flank Left;Lower Stage 2 -  Partial thickness loss of dermis presenting as a shallow open injury with a red, pink wound bed without slough. (Active)  11/24/21 0430  Location: Flank  Location Orientation: Left;Lower  Staging: Stage 2 -  Partial thickness loss of dermis presenting as a shallow open injury with a red, pink wound bed without slough.  Wound Description (Comments):   Present on Admission: No  Dressing Type Foam - Lift dressing to assess site every shift 05/02/22 1000      Estimated body mass index is 43.25 kg/m as calculated from the following:   Height as of this encounter: 5\' 5"  (1.651 m).   Weight as of this encounter: 117.9 kg.   DVT prophylaxis: Aspirin Code Status: Full code Family Communication: Care discussed with  patient Disposition Plan:  Status is: Inpatient Remains inpatient appropriate because: Awaiting out patient HD set up, she needs to be able to sit for HD    Consultants:  Nephrology ID   Procedures:    Antimicrobials:    Subjective: Complaints of swelling left eye lids. Plan to continue with warm compress.   Objective: Vitals:   05/01/22 2101 05/02/22 0500 05/02/22 0531 05/02/22 0920  BP: (!) 140/60  (!) 153/89 122/67  Pulse: (!) 54  60 61  Resp: 18  18   Temp: 98.4 F (36.9 C)  98.1 F (36.7 C) 98.4 F (36.9 C)  TempSrc: Oral  Oral Oral  SpO2: 100%  100% 100%  Weight:  117.9 kg    Height:        Intake/Output Summary (Last 24 hours) at 05/02/2022 1203 Last data filed at 05/02/2022 8527 Gross per 24 hour  Intake 180 ml  Output 2100 ml  Net -1920 ml    Filed Weights   05/01/22 1405 05/01/22 1816 05/02/22 0500  Weight: 119.7 kg 117.6 kg 117.9 kg    Examination:  General exam: NAD Respiratory system: Normal Respiratory  effort.  Central nervous system: Alert, conversant, follows command Extremities: BL AKA Skin: Multiples wound  Data Reviewed: I have personally reviewed following labs and imaging studies  CBC: Recent Labs  Lab 04/29/22 1441  WBC 6.4  HGB 8.3*  HCT 29.3*  MCV 96.7  PLT 782    Basic Metabolic Panel: Recent Labs  Lab 04/27/22 0240 04/29/22 1441 05/02/22 0826  NA 136 138 134*  K 2.8* 6.0* 5.3*  CL 96* 99 96*  CO2 29 25 26   GLUCOSE 96 85 78  BUN 14 57* 31*  CREATININE 2.32* 9.07* 6.00*  CALCIUM 8.5* 9.9 9.7  PHOS 1.1* 5.7*  --     GFR: Estimated Creatinine Clearance: 14.3 mL/min (A) (by C-G formula based on SCr of 6 mg/dL (H)). Liver Function Tests: Recent Labs  Lab 04/27/22 0240 04/29/22 1441  ALBUMIN 3.4* 3.3*    No results for input(s): "LIPASE", "AMYLASE" in the last 168 hours. No results for input(s): "AMMONIA" in the last 168 hours. Coagulation Profile: No results for input(s): "INR", "PROTIME" in the last 168 hours. Cardiac Enzymes: No results for input(s): "CKTOTAL", "  CKMB", "CKMBINDEX", "TROPONINI" in the last 168 hours. BNP (last 3 results) No results for input(s): "PROBNP" in the last 8760 hours. HbA1C: No results for input(s): "HGBA1C" in the last 72 hours. CBG: No results for input(s): "GLUCAP" in the last 168 hours. Lipid Profile: No results for input(s): "CHOL", "HDL", "LDLCALC", "TRIG", "CHOLHDL", "LDLDIRECT" in the last 72 hours. Thyroid Function Tests: No results for input(s): "TSH", "T4TOTAL", "FREET4", "T3FREE", "THYROIDAB" in the last 72 hours. Anemia Panel: No results for input(s): "VITAMINB12", "FOLATE", "FERRITIN", "TIBC", "IRON", "RETICCTPCT" in the last 72 hours. Sepsis Labs: No results for input(s): "PROCALCITON", "LATICACIDVEN" in the last 168 hours.  No results found for this or any previous visit (from the past 240 hour(s)).       Radiology Studies: No results found.      Scheduled Meds:  apixaban  2.5 mg Oral  BID   aspirin EC  81 mg Oral Daily   atorvastatin  10 mg Oral Daily   darbepoetin (ARANESP) injection - DIALYSIS  200 mcg Subcutaneous Q Tue-1800   docusate sodium  100 mg Oral BID   famotidine  20 mg Oral Daily   feeding supplement  1 Container Oral TID BM   fentaNYL  1 patch Transdermal Once   [START ON 05/03/2022] fentaNYL  1 patch Transdermal Q72H   gabapentin  100 mg Oral QHS   Gerhardt's butt cream   Topical BID   heparin sodium (porcine)  2,500 Units Intravenous Once   levothyroxine  100 mcg Oral Q0600   midodrine  10 mg Oral Once per day on Tue Thu Sat   pantoprazole  40 mg Oral Daily   pregabalin  50 mg Oral Daily   sevelamer carbonate  4.8 g Oral TID WC   sodium zirconium cyclosilicate  5 g Oral Once   sucroferric oxyhydroxide  500 mg Oral TID WC   tobramycin   Left Eye TID   Continuous Infusions:  sodium thiosulfate 12.5 g in sodium chloride 0.9 % 150 mL Infusion for Calciphylaxis Stopped (04/29/22 1746)   sodium thiosulfate 12.5 g in sodium chloride 0.9 % 150 mL Infusion for Calciphylaxis       LOS: 182 days    Time spent: 35 minutes    Courtney Bowron A Carnelia Oscar, MD [facility]   If 7PM-7AM, please contact night-coverage [email or web address]  05/02/2022, 12:03 PM

## 2022-05-02 NOTE — Plan of Care (Signed)

## 2022-05-02 NOTE — Progress Notes (Signed)
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   OT comments  Progress towards OT goals limited by pt internal distraction regarding misunderstandings with nursing staff. Pt initially not planning to participate with OT but after being allowed space to express self, pt agreeable to refocus on current OT goals, plans for next sessions and engagement in lateral leans to simulate LB ADL skills, as well as addressing strategies to achieve prone position for stretching.    Recommendations for follow up therapy are one component of a multi-disciplinary discharge planning process, led by the attending physician.  Recommendations may be updated based on patient status, additional functional criteria and insurance authorization.    Follow Up Recommendations  Skilled nursing-short term rehab (<3 hours/day) (though pt declining SNF; would benefit from [facility] follow up if opting to DC home pending ability to obtain housing)     Assistance Recommended at Discharge Intermittent Supervision/Assistance  Patient can return home with the following  A little help with walking and/or transfers;A little help with bathing/dressing/bathroom   Equipment Recommendations  Other (comment);Hospital bed (power chair w/ tilt in space features)    Recommendations for Other Services      Precautions / Restrictions Precautions Precautions: Fall Precaution Comments: bil AKA, 2L O2 at baseline (has been tolerating mobility in power chair on [employer] fine); when in power chair, MUST perform  pressure relief every 30 minutes by tilting back for 10 minutes Restrictions Weight Bearing Restrictions: Yes RUE Weight Bearing: Weight bearing as tolerated LUE Weight Bearing: Weight bearing as tolerated RLE Weight Bearing: Non weight bearing LLE Weight Bearing: Non weight bearing       Mobility Bed Mobility Overal bed mobility: Modified Independent             General bed mobility comments: lateral leaning, initiation of prone for stretching    Transfers                   General transfer comment: declined     Balance Overall balance assessment: Modified Independent Sitting-balance support: No upper extremity supported Sitting balance-[name] Scale: Good                                     ADL either performed or assessed with clinical judgement   ADL Overall ADL's : Needs assistance/impaired                                       General ADL Comments: Focus on lateral leans while exchanging bedpads to simulate toileting hygiene and LB dressing. educated on OT goals for locating bari BSC to trial and continued LB dressing assessments w/ pt politely declining these particular tasks today    Extremity/Trunk Assessment Upper Extremity Assessment Upper Extremity Assessment: Overall WFL for tasks assessed   Lower Extremity Assessment Lower Extremity Assessment: Defer to PT evaluation        Vision   Vision Assessment?: No apparent visual  deficits   Perception     Praxis      Cognition Arousal/Alertness: Awake/alert Behavior During Therapy: WFL for tasks assessed/performed Overall Cognitive Status: Within Functional Limits for tasks assessed                                 General Comments: though initially upset regarding a misunderstanding with nursing earlier, tangential at times, able to show insight into problem solving and shows anticipatory awareness. benefits from encouragement for independent tasks  outside of therapy (calling around to look for an apartment)        Exercises      Shoulder Instructions       General Comments      Pertinent Vitals/ Pain       Pain Assessment Pain Assessment: Faces Faces Pain Scale: No hurt Pain Intervention(s): Monitored during session, Premedicated before session  Home Living                                          Prior Functioning/Environment              Frequency  Min 2X/week        Progress Toward Goals  OT Goals(current goals can now be found in the care plan section)  Progress towards OT goals: OT to reassess next treatment  Acute Rehab OT Goals Patient Stated Goal: find an apartment OT Goal Formulation: With patient Time For Goal Achievement: 05/16/22 Potential to Achieve Goals: Good  Plan Discharge plan remains appropriate;Frequency needs to be updated    Co-evaluation                 AM-PAC OT "6 Clicks" Daily Activity     Outcome Measure   Help from another person eating meals?: None Help from another person taking care of personal grooming?: A Little Help from another person toileting, which includes using toliet, bedpan, or urinal?: A Lot Help from another person bathing (including washing, rinsing, drying)?: A Lot Help from another person to put on and taking off regular upper body clothing?: A Little Help from another person to put on and taking off regular lower body clothing?: A Lot 6 Click Score: 16    End of Session Equipment Utilized During Treatment: Oxygen  OT Visit Diagnosis: Muscle weakness (generalized) (M62.81);History of falling (Z91.81) Pain - Right/Left: Right Pain - part of body: Leg   Activity Tolerance Patient tolerated treatment well   Patient Left in bed;with call bell/phone within reach   Nurse Communication          Time: 6712-4580 OT Time Calculation (min): 44 min  Charges: OT General Charges $OT Visit: 1 Visit OT Treatments $Self  Care/Home Management : 8-22 mins $Therapeutic Activity: 23-37 mins  Malachy Chamber, OTR/L Acute Rehab Services Office: [phone]   Layla Maw 05/02/2022, 1:26 PM

## 2022-05-02 NOTE — TOC Progression Note (Signed)
Transition of Care [facility]) - Initial/Assessment Note    Patient Details  Name: Courtney Gray MRN: [id number] Date of Birth: July 27, 1970  Transition of [facility]) CM/SW Contact:    Milinda Antis, [place] Phone Number: 05/02/2022, 11:13 AM  Clinical Narrative:                 LCSW met with patient at bedside and presented housing list and websites to complete apartment/housing searches.  TOC following.   Expected Discharge Plan: Long Term Nursing Home Barriers to Discharge: No SNF bed, Transportation, Waiting for outpatient dialysis   Patient Goals and CMS Choice Patient states their goals for this hospitalization and ongoing recovery are:: To be at a facility close to her daughter          Expected Discharge Plan and Services       Living arrangements for the past 2 months: Single Family Home                                      Prior Living Arrangements/Services Living arrangements for the past 2 months: Single Family Home Lives with:: Adult Children Patient language and need for interpreter reviewed:: Yes        Need for Family Participation in Patient Care: Yes (Comment) Care giver support system in place?: No (comment)   Criminal Activity/Legal Involvement Pertinent to Current Situation/Hospitalization: No - Comment as needed  Activities of Daily Living Home Assistive Devices/Equipment: [employer] fast streamer ADL Screening (condition at time of admission) Patient's cognitive ability adequate to safely complete daily activities?: No Is the patient deaf or have difficulty hearing?: No Does the patient have difficulty seeing, even when wearing glasses/contacts?: No Does the patient have difficulty concentrating, remembering, or making decisions?: No Patient able to express need for assistance with ADLs?: Yes Does the patient have difficulty dressing or bathing?: Yes Independently performs ADLs?: No Does the patient have difficulty walking or climbing stairs?:  Yes Weakness of Legs: Both Weakness of Arms/Hands: Both  Permission Sought/Granted Permission sought to share information with : Other (comment) Permission granted to share information with : Yes, Verbal Permission Granted  Share Information with NAME: Riding,antoinette (Daughter)   [phone]  Permission granted to share info w AGENCY: SNF        Emotional Assessment Appearance:: Appears older than stated age Attitude/Demeanor/Rapport: Engaged Affect (typically observed): Tearful/Crying, Accepting Orientation: : Oriented to Situation, Oriented to  Time, Oriented to Place, Oriented to Self Alcohol / Substance Use: Not Applicable Psych Involvement: No (comment)  Admission diagnosis:  End stage renal disease ([place]) [N18.6] Fluid overload [E87.70] Obesity with serious comorbidity, unspecified classification, unspecified obesity type [E66.9] Pressure injury of right buttock, stage 2 ([place]) [L89.312] Patient Active Problem List   Diagnosis Date Noted   Nodule of skin of abdomen 02/16/2022   Acute on chronic systolic CHF (congestive heart failure) ([place]) 02/11/2022   Acute on chronic diastolic CHF (congestive heart failure) ([place]) 12/12/2021   PAD (peripheral artery disease) ([place]) 12/12/2021   Irritable bowel syndrome (IBS) 12/12/2021   Amputation of left middle finger 12/12/2021   GERD (gastroesophageal reflux disease) 12/12/2021   Hypertension    Type 2 diabetes mellitus with hyperlipidemia ([place])    Decubitus ulcer of sacral region, stage 3 ([place])    Class 3 obesity ([place])    Bacteremia    PVD (peripheral vascular disease) (HCC)    Hepatic abscess    Pressure  injury of skin 09/13/2021   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[place])    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   Lactic acidosis 09/10/2021   Mixed diabetic hyperlipidemia associated with type 2 diabetes mellitus ([place]) 09/10/2021   Coronary artery disease involving native coronary artery of native heart without angina pectoris  09/10/2021   Elevated troponin level not due myocardial infarction 09/10/2021   Hypotension 09/10/2021   Dry gangrene (HCC)    Pain 08/03/2021   Ischemic foot pain at rest 08/03/2021   Hypoglycemia 07/10/2021   Volume overload 07/09/2021   Leukocytosis 06/11/2021   Acute gout 06/11/2021   Hyperkalemia 05/21/2021   Hemorrhoids    Hyperlipidemia 05/18/2021   Rectal bleeding 05/17/2021   Morbid obesity with BMI of 60.0-69.9, adult ([place]) 05/17/2021   NSTEMI (non-ST elevated myocardial infarction) ([place]) 05/16/2021   Essential hypertension 09/01/2020   End stage renal disease ([place]) 09/01/2020   Chronic respiratory failure with hypoxia ([place]) 09/01/2020   Hypertensive emergency    Hypertensive urgency 11/28/2019   Uncontrolled type 2 diabetes mellitus with hypoglycemia, with long-term current use of insulin ([place]) 11/28/2019   Anemia due to chronic kidney disease 11/28/2019   PCP:  Monico Blitz, MD Pharmacy:   [place] [place] [address] [place] [address] Phone: [phone] Fax: [phone]  [facility] [address] [address] Phone: [phone] Fax: [phone]  [place], [place] - [address] [place] [address] Phone: [phone] Fax: [phone]     Social Determinants of Health (SDOH) Social History: [place]: No Food Insecurity (12/11/2021)  Housing: Low Risk  (12/11/2021)  Transportation Needs: No Transportation Needs (12/11/2021)  Utilities: At Risk (12/11/2021)  Tobacco Use: Low Risk  (03/07/2022)   SDOH Interventions:     Readmission Risk Interventions    09/16/2021    4:36 PM 08/28/2021   11:50 AM 07/12/2021   12:38 PM  Readmission Risk Prevention Plan  Transportation Screening Complete Complete Complete  Medication Review Press photographer)  Complete Complete  PCP or Specialist appointment  within 3-5 days of discharge  Complete Complete  HRI or Home Care Consult Complete Complete Complete  SW Recovery Care/Counseling Consult Complete Complete Complete  Palliative Care Screening  Not Applicable Not Applicable  Skilled Nursing Facility Complete Not Applicable Complete

## 2022-05-03 DIAGNOSIS — N186 End stage renal disease: Secondary | ICD-10-CM | POA: Diagnosis not present

## 2022-05-03 MED ORDER — ANTICOAGULANT SODIUM CITRATE 4% (200MG/5ML) IV SOLN: 5.0000 mL | Status: DC | PRN

## 2022-05-03 MED ORDER — ARTIFICIAL TEARS OPHTHALMIC OINT
TOPICAL_OINTMENT | OPHTHALMIC | Status: DC | PRN
  Filled 2022-05-03 (×2): qty 3.5

## 2022-05-03 MED ORDER — HEPARIN SODIUM (PORCINE) 1000 UNIT/ML DIALYSIS
2500.0000 [IU] | Freq: Once | INTRAMUSCULAR | Status: DC
  Administered 2022-05-03: 2500 [IU] via INTRAVENOUS_CENTRAL
  Filled 2022-05-03: qty 3

## 2022-05-03 MED ORDER — ALTEPLASE 2 MG IJ SOLR: 2.0000 mg | Freq: Once | INTRAMUSCULAR | Status: DC | PRN

## 2022-05-03 MED ORDER — AZITHROMYCIN 500 MG PO TABS
250.0000 mg | ORAL_TABLET | Freq: Every day | ORAL | Status: AC
  Administered 2022-05-04 – 2022-05-07 (×3): 250 mg via ORAL
  Filled 2022-05-03 (×4): qty 1

## 2022-05-03 MED ORDER — AZITHROMYCIN 500 MG PO TABS
500.0000 mg | ORAL_TABLET | Freq: Once | ORAL | Status: AC
  Administered 2022-05-03: 500 mg via ORAL
  Filled 2022-05-03: qty 1

## 2022-05-03 MED ORDER — HEPARIN SODIUM (PORCINE) 1000 UNIT/ML DIALYSIS
1000.0000 [IU] | INTRAMUSCULAR | Status: DC | PRN
  Filled 2022-05-03: qty 1

## 2022-05-03 NOTE — Procedures (Signed)
HD Note:  Some information was entered later than the data was gathered due to patient care needs. The stated time with the data is accurate.  Received patient in bed to unit.  Alert and oriented.  Informed consent signed and in chart.   Patient tolerated well.   Transported back to the room  Alert, without acute distress.  Hand-off given to patient's nurse.   Access used: Left chest HD catheter Access issues: would only run at 300 BFR  Total UF removed: 3000 ml   [facility]

## 2022-05-03 NOTE — Progress Notes (Signed)
PROGRESS NOTE    Courtney Gray  QIW:[id number] DOB: 1971/02/17 DOA: 11/01/2021 PCP: Monico Blitz, MD   Brief Narrative: 52 year old with past medical history significant for ESRD on hemodialysis TTS, recent hospitalization for bacteremia, hepatic and splenic abscess on long-term IV antibiotics, diastolic heart failure, PVD status post bilateral AKA, insulin-dependent diabetes type 2, morbid obesity and chronic sacral wound presents with progressive shortness of breath in the setting of missing hemodialysis and admitted for hypertensive emergency. Patient with long length of stay, discharge pending due to ability to obtain outpatient hemodialysis that can accumulate patient's mobility constraints secondary to decubitus ulcer wound and bilateral AKA.  Patient will need to be able to seat HD chair to be able to get out patient HD set up. Hospital HD unit is working on getting bariatric HD chair to be able to accommodate patient.     Assessment & Plan:   Active Problems:   End stage renal disease ([place])   Hypertension   Decubitus ulcer of sacral region, stage 3 (HCC)   Type 2 diabetes mellitus with hyperlipidemia (HCC)   Chronic respiratory failure with hypoxia (HCC)   Bacteremia   PVD (peripheral vascular disease) (HCC)   Class 3 obesity (HCC)   Anemia due to chronic kidney disease   Hypertensive emergency   Leukocytosis   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HCC)   Pressure injury of skin   PAD (peripheral artery disease) (HCC)   Irritable bowel syndrome (IBS)   Amputation of left middle finger   GERD (gastroesophageal reflux disease)   Nodule of skin of abdomen   1-ESRD, HD: -Nephrology Following, TTS schedule. - Needs Bariatric HD chair in the unit to accommodate patient.  -Working on tolerance sitting in the chair -Continue with midodrine.  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  She is now has been off of oral antibiotics. -Patient declined antibiotics, due to concern for GI issues.  She will continue with wound care. -Doxycycline was discontinued 12/1 Local care.  On fentanyl patch and  Oxycodone for PRN pain.  Continue with wound care.   3-Calciphylaxis: Abdominal wound.  Na-thiosulfate  per nephrology As needed oxycodone Continue with wound care.  She declines further imagines.   4-Hypotension during HD: Continue with midodrine on hemodialysis days Monitor BP.   Phantom limb pain: Continue with Lyrica, gabapentin.   Hyperkalemia; correction with HD. Hd today.   Decubitus ulcer of sacral region stage III: Continue with wound care, appreciate wound care recommendations.   Hypothyroidism: Continue with Synthroid Diabetes type 2 with hyperlipidemia neuropathy: Continue with aspirin and statins Bacteremia: Patient has completed antibiotic therapy   PVD (peripheral vascular disease) ([place]) History of Bilateral AKA   Class 3 obesity (HCC) BMI >40    Chronic respiratory failure Due to morbid obesity  Constipation; PRN Dulcolax suppository  Stye, Blepharitis. : on topical antibiotics, warm compress. Not improving. Will start artificial tear and oral Azithromycin for 5 days.   Pressure Injury 11/12/21 Hip Right Unstageable - Full thickness tissue loss in which the base of the injury is covered by slough (yellow, tan, gray, green or brown) and/or eschar (tan, brown or black) in the wound bed. 90% covered with slough (Active)  11/12/21 0800  Location: Hip  Location Orientation: Right  Staging: Unstageable - Full thickness tissue loss in which the base of the injury is covered by slough (yellow, tan, gray, green or brown) and/or  eschar (tan, brown or black) in the wound bed.  Wound Description (Comments): 90% covered with slough  Present on Admission: Yes  Dressing Type Foam - Lift dressing to assess site every shift 05/02/22 2045      Pressure Injury 11/24/21 Flank Left;Lower Stage 2 -  Partial thickness loss of dermis presenting as a shallow open injury with a red, pink wound bed without slough. (Active)  11/24/21 0430  Location: Flank  Location Orientation: Left;Lower  Staging: Stage 2 -  Partial thickness loss of dermis presenting as a shallow open injury with a red, pink wound bed without slough.  Wound Description (Comments):   Present on Admission: No  Dressing Type Foam - Lift dressing to assess site every shift 05/02/22 2045      Estimated body mass index is 43.25 kg/m as calculated from the following:   Height as of this encounter: 5\' 5"  (1.651 m).   Weight as of this encounter: 117.9 kg.   DVT prophylaxis: Aspirin Code Status: Full code Family Communication: Care discussed with  patient Disposition Plan:  Status is: Inpatient Remains inpatient appropriate because: Awaiting out patient HD set up, she needs to be able to sit for HD    Consultants:  Nephrology ID   Procedures:    Antimicrobials:    Subjective: Left eyelid swelling is worse, not getting better.  Having pain in her wounds.  Waiting to go to HD>  Objective: Vitals:   05/03/22 0500 05/03/22 0524 05/03/22 0900 05/03/22 0910  BP:  (!) 99/59 (!) 141/113 (!) 143/50  Pulse:  64 66   Resp:  18 18   Temp:  98.1 F (36.7 C) 98.1 F (36.7 C)   TempSrc:  Oral Oral   SpO2:  100% 91%   Weight: 117.9 kg     Height:        Intake/Output Summary (Last 24 hours) at 05/03/2022 1322 Last data filed at 05/03/2022 1000 Gross per 24 hour  Intake 240 ml  Output --  Net 240 ml    Filed Weights   05/01/22 1816 05/02/22 0500 05/03/22 0500  Weight: 117.6 kg 117.9 kg 117.9 kg    Examination:  General exam: NAD Respiratory system: Normal Res effort.  Central nervous system: Alert, conversant, follows command Extremities: BL BKA Skin: Multiples wound  Data Reviewed: I have personally reviewed following labs and imaging  studies  CBC: Recent Labs  Lab 04/29/22 1441  WBC 6.4  HGB 8.3*  HCT 29.3*  MCV 96.7  PLT 431    Basic Metabolic Panel: Recent Labs  Lab 04/27/22 0240 04/29/22 1441 05/02/22 0826  NA 136 138 134*  K 2.8* 6.0* 5.3*  CL 96* 99 96*  CO2 29 25 26   GLUCOSE 96 85 78  BUN 14 57* 31*  CREATININE 2.32* 9.07* 6.00*  CALCIUM 8.5* 9.9 9.7  PHOS 1.1* 5.7*  --     GFR: Estimated Creatinine Clearance: 14.3 mL/min (A) (by C-G formula based on SCr of 6 mg/dL (H)). Liver Function Tests: Recent Labs  Lab 04/27/22 0240 04/29/22 1441  ALBUMIN 3.4* 3.3*    No results for input(s): "LIPASE", "AMYLASE" in the last 168 hours. No results for input(s): "AMMONIA" in the last 168 hours. Coagulation Profile: No results for input(s): "INR", "PROTIME" in the last 168 hours. Cardiac Enzymes: No results for input(s): "CKTOTAL", "CKMB", "CKMBINDEX", "TROPONINI" in the last 168 hours. BNP (last 3 results) No results for input(s): "PROBNP" in the last 8760 hours. HbA1C: No results for  input(s): "HGBA1C" in the last 72 hours. CBG: No results for input(s): "GLUCAP" in the last 168 hours. Lipid Profile: No results for input(s): "CHOL", "HDL", "LDLCALC", "TRIG", "CHOLHDL", "LDLDIRECT" in the last 72 hours. Thyroid Function Tests: No results for input(s): "TSH", "T4TOTAL", "FREET4", "T3FREE", "THYROIDAB" in the last 72 hours. Anemia Panel: No results for input(s): "VITAMINB12", "FOLATE", "FERRITIN", "TIBC", "IRON", "RETICCTPCT" in the last 72 hours. Sepsis Labs: No results for input(s): "PROCALCITON", "LATICACIDVEN" in the last 168 hours.  No results found for this or any previous visit (from the past 240 hour(s)).       Radiology Studies: No results found.      Scheduled Meds:  apixaban  2.5 mg Oral BID   aspirin EC  81 mg Oral Daily   atorvastatin  10 mg Oral Daily   [START ON 05/04/2022] azithromycin  250 mg Oral Daily   darbepoetin (ARANESP) injection - DIALYSIS  200 mcg  Subcutaneous Q Tue-1800   docusate sodium  100 mg Oral BID   famotidine  20 mg Oral Daily   feeding supplement  1 Container Oral TID BM   fentaNYL  1 patch Transdermal Once   fentaNYL  1 patch Transdermal Q72H   gabapentin  100 mg Oral QHS   Gerhardt's butt cream   Topical BID   heparin  2,500 Units Dialysis Once in dialysis   levothyroxine  100 mcg Oral Q0600   midodrine  10 mg Oral Once per day on Tue Thu Sat   pantoprazole  40 mg Oral Daily   pregabalin  50 mg Oral Daily   sevelamer carbonate  4.8 g Oral TID WC   sucroferric oxyhydroxide  500 mg Oral TID WC   tobramycin   Left Eye TID   Continuous Infusions:  anticoagulant sodium citrate     sodium thiosulfate 12.5 g in sodium chloride 0.9 % 150 mL Infusion for Calciphylaxis Stopped (04/29/22 1746)   sodium thiosulfate 12.5 g in sodium chloride 0.9 % 150 mL Infusion for Calciphylaxis       LOS: 183 days    Time spent: 35 minutes    Berta Denson A Christofer Shen, MD [facility]   If 7PM-7AM, please contact night-coverage [email or web address]  05/03/2022, 1:22 PM

## 2022-05-03 NOTE — Progress Notes (Cosign Needed)
[facility] Progress Note   Subjective: Seen in room sleeping. HD later today.    Objective Vitals:   05/03/22 0500 05/03/22 0524 05/03/22 0900 05/03/22 0910  BP:  (!) 99/59 (!) 141/113 (!) 143/50  Pulse:  64 66   Resp:  18 18   Temp:  98.1 F (36.7 C) 98.1 F (36.7 C)   TempSrc:  Oral Oral   SpO2:  100% 91%   Weight: 117.9 kg     Height:       Physical Exam General: Obese female in NAD Heart: S1,S2 HS distant No M/R/G Lungs: CTAB A/P Abdomen: obese NABS Extremities: bilateral AKA Dialysis Access: [facility] drsg CDI  Additional Objective Labs: Basic Metabolic Panel: Recent Labs  Lab 04/27/22 0240 04/29/22 1441 05/02/22 0826  NA 136 138 134*  K 2.8* 6.0* 5.3*  CL 96* 99 96*  CO2 29 25 26   GLUCOSE 96 85 78  BUN 14 57* 31*  CREATININE 2.32* 9.07* 6.00*  CALCIUM 8.5* 9.9 9.7  PHOS 1.1* 5.7*  --    Liver Function Tests: Recent Labs  Lab 04/27/22 0240 04/29/22 1441  ALBUMIN 3.4* 3.3*   No results for input(s): "LIPASE", "AMYLASE" in the last 168 hours. CBC: Recent Labs  Lab 04/29/22 1441  WBC 6.4  HGB 8.3*  HCT 29.3*  MCV 96.7  PLT 190   Blood Culture    Component Value Date/Time   SDES BLOOD LEFT ANTECUBITAL 10/16/2021 1534   SPECREQUEST  10/16/2021 1534    BOTTLES DRAWN AEROBIC AND ANAEROBIC Blood Culture results may not be optimal due to an inadequate volume of blood received in culture bottles   CULT  10/16/2021 1534    NO GROWTH 5 DAYS Performed at [facility], [place] [address], [place], [place] [address]    REPTSTATUS 10/21/2021 FINAL 10/16/2021 1534    Cardiac Enzymes: No results for input(s): "CKTOTAL", "CKMB", "CKMBINDEX", "TROPONINI" in the last 168 hours. CBG: No results for input(s): "GLUCAP" in the last 168 hours. Iron Studies: No results for input(s): "IRON", "TIBC", "TRANSFERRIN", "FERRITIN" in the last 72 hours. @lablastinr3 @ Studies/Results: No results found. Medications:  anticoagulant sodium citrate      sodium thiosulfate 12.5 g in sodium chloride 0.9 % 150 mL Infusion for Calciphylaxis Stopped (04/29/22 1746)   sodium thiosulfate 12.5 g in sodium chloride 0.9 % 150 mL Infusion for Calciphylaxis      apixaban  2.5 mg Oral BID   aspirin EC  81 mg Oral Daily   atorvastatin  10 mg Oral Daily   darbepoetin (ARANESP) injection - DIALYSIS  200 mcg Subcutaneous Q Tue-1800   docusate sodium  100 mg Oral BID   famotidine  20 mg Oral Daily   feeding supplement  1 Container Oral TID BM   fentaNYL  1 patch Transdermal Once   fentaNYL  1 patch Transdermal Q72H   gabapentin  100 mg Oral QHS   Gerhardt's butt cream   Topical BID   heparin  2,500 Units Dialysis Once in dialysis   levothyroxine  100 mcg Oral Q0600   midodrine  10 mg Oral Once per day on Tue Thu Sat   pantoprazole  40 mg Oral Daily   pregabalin  50 mg Oral Daily   sevelamer carbonate  4.8 g Oral TID WC   sucroferric oxyhydroxide  500 mg Oral TID WC   tobramycin   Left Eye TID     Dialysis Orders: Debility: Has been unable to tolerate dialysis in  recliner and therefore unable to discharge to outpatient dialysis. Per her report has lost >50 lbs during admission. Sacral decub ulcers remain a barrier. She now has new powerchair/cushion and has been sitting for longer periods.  Appreciate efforts from PT team.   ESRD: Was usual TTS schedule .  Refusing HD at times. Back on schedule. Next HD 05/06/2022. 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 Started empirically on sodium thiosulfate, but caused vomiting so stopped - now getting 1/2 dose - 12.5mg  q HD, refuses increased dose. Not on any calcium based products or vitamin D. HTN /volume: On midodrine 10mg  qHD, with 10mg  prn mid HD w/parameters to keep SBP >100 d/t becoming symptomatic when it drops lower. Significant weight loss this admit Severe ischial sacral decubitus wound: CT  showing chronic osteomyelitis. Limiting her, noted to be slow healing. Goal to tolerate sitting upright - which she is doing with PT.  COPD/Chronic O2 use: Prior O2 at home (5L/min) -> tapered to 2-3L/min here Anemia of ESRD: Hgb 8.3 on max dose Aranesp 223mcg q Tues. Recent Fe load completed.  Secondary hyperparathyroidism: On two binders. CorrCa elevated. Not on VDRA, last PTH 86 (low) on 03/11/22. Avoid all calcium based medications for Vitamin D d/t concern for calciphylaxis.  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 Nutrition -ALB 3.3, diet continues to be changed from renal.  Currently on regular diet.  Would really prefer she be on a renal diet but patient has been refusing Disposition: Difficult situation. Prior to admit, patient required 6 staff members and 2 hoyer pads for transfer into HD chair. Could not stay on HD for full tx. 2/2 pain of multiple decubitus ulcers and PTAR unable to lift pt into ambulance for transport.SW working on LTC at [employer]. Will need to tolerate 5-6hrs in a chair to tolerate OP dialysis including transport, treatment and cannulation time.  The main issue is her Rt ischial wound. She now has new power chair and cushion. She has been sitting in the chair for longer periods. Up to 4 hours past week.    Drucilla Cumber H. Felica Chargois NP-C 05/03/2022, 11:57 AM  [employer] [phone]

## 2022-05-04 DIAGNOSIS — N186 End stage renal disease: Secondary | ICD-10-CM | POA: Diagnosis not present

## 2022-05-04 NOTE — Plan of Care (Signed)
  Problem: Clinical Measurements: Goal: Ability to maintain clinical measurements within normal limits will improve Outcome: Progressing

## 2022-05-04 NOTE — Progress Notes (Signed)
PROGRESS NOTE    Courtney Gray  TGG:[id number] DOB: 05-16-1970 DOA: 11/01/2021 PCP: Monico Blitz, MD   Brief Narrative: 52 year old with past medical history significant for ESRD on hemodialysis TTS, recent hospitalization for bacteremia, hepatic and splenic abscess on long-term IV antibiotics, diastolic heart failure, PVD status post bilateral AKA, insulin-dependent diabetes type 2, morbid obesity and chronic sacral wound presents with progressive shortness of breath in the setting of missing hemodialysis and admitted for hypertensive emergency. Patient with long length of stay, discharge pending due to ability to obtain outpatient hemodialysis that can accumulate patient's mobility constraints secondary to decubitus ulcer wound and bilateral AKA.  Patient will need to be able to seat HD chair to be able to get out patient HD set up. Hospital HD unit is working on getting bariatric HD chair to be able to accommodate patient.     Assessment & Plan:   Active Problems:   End stage renal disease ([facility])   Hypertension   Decubitus ulcer of sacral region, stage 3 (HCC)   Type 2 diabetes mellitus with hyperlipidemia (HCC)   Chronic respiratory failure with hypoxia (HCC)   Bacteremia   PVD (peripheral vascular disease) (HCC)   Class 3 obesity (HCC)   Anemia due to chronic kidney disease   Hypertensive emergency   Leukocytosis   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HCC)   Pressure injury of skin   PAD (peripheral artery disease) (HCC)   Irritable bowel syndrome (IBS)   Amputation of left middle finger   GERD (gastroesophageal reflux disease)   Nodule of skin of abdomen   1-ESRD, HD: -Nephrology Following, TTS schedule. - Needs Bariatric HD chair in the unit to accommodate patient.  -Working on tolerance sitting in the chair -Continue with midodrine.  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  She is now has been off of oral antibiotics. -Patient declined antibiotics, due to concern for GI issues.  She will continue with wound care. -Doxycycline was discontinued 12/1 -Local care.  -On fentanyl patch and  Oxycodone for PRN pain.  -Continue with wound care.   3-Calciphylaxis: Abdominal wound.  Na-thiosulfate  per nephrology As needed oxycodone Continue with wound care.  She declines further imagines.   4-Hypotension during HD: Continue with midodrine on hemodialysis days Monitor BP.   Phantom limb pain: Continue with Lyrica, gabapentin.   Hyperkalemia; correction with HD. Hd today.   Decubitus ulcer of sacral region stage III: Continue with wound care, appreciate wound care recommendations.   Hypothyroidism: Continue with Synthroid Diabetes type 2 with hyperlipidemia neuropathy: Continue with aspirin and statins Bacteremia: Patient has completed antibiotic therapy   PVD (peripheral vascular disease) ([place]) History of Bilateral AKA   Class 3 obesity (HCC) BMI >40    Chronic respiratory failure Due to morbid obesity  Constipation; PRN Dulcolax suppository  Stye, Blepharitis. : on topical antibiotics, warm compress. Not improving.  Started  artificial tear and oral Azithromycin for 5 days on 1/27  Pressure Injury 11/12/21 Hip Right Unstageable - Full thickness tissue loss in which the base of the injury is covered by slough (yellow, tan, gray, green or brown) and/or eschar (tan, brown or black) in the wound bed. 90% covered with slough (Active)  11/12/21 0800  Location: Hip  Location Orientation: Right  Staging: Unstageable - Full thickness tissue loss in which the base of the injury is covered by slough (yellow, tan, gray, green or  brown) and/or eschar (tan, brown or black) in the wound bed.  Wound Description (Comments): 90% covered with slough  Present on Admission: Yes  Dressing Type Foam - Lift dressing to assess site every shift  05/04/22 0100     Pressure Injury 11/24/21 Flank Left;Lower Stage 2 -  Partial thickness loss of dermis presenting as a shallow open injury with a red, pink wound bed without slough. (Active)  11/24/21 0430  Location: Flank  Location Orientation: Left;Lower  Staging: Stage 2 -  Partial thickness loss of dermis presenting as a shallow open injury with a red, pink wound bed without slough.  Wound Description (Comments):   Present on Admission: No  Dressing Type Foam - Lift dressing to assess site every shift 05/04/22 0100      Estimated body mass index is 42.23 kg/m as calculated from the following:   Height as of this encounter: 5\' 5"  (1.651 m).   Weight as of this encounter: 115.1 kg.   DVT prophylaxis: Aspirin Code Status: Full code Family Communication: Care discussed with  patient Disposition Plan:  Status is: Inpatient Remains inpatient appropriate because: Awaiting out patient HD set up, she needs to be able to sit for HD    Consultants:  Nephrology ID   Procedures:    Antimicrobials:    Subjective: Report pain eyelid, no new complaints.   Objective: Vitals:   05/03/22 1810 05/03/22 2116 05/04/22 0541 05/04/22 0959  BP: 110/72 (!) 160/138 (!) 151/68 (!) 118/45  Pulse: 64 83 63 60  Resp: 18 18 18 17   Temp: 97.8 F (36.6 C) 98.6 F (37 C) 98.3 F (36.8 C) 98.4 F (36.9 C)  TempSrc:  Oral Oral Oral  SpO2: 98% 99% 100% 99%  Weight:      Height:        Intake/Output Summary (Last 24 hours) at 05/04/2022 1408 Last data filed at 05/04/2022 0800 Gross per 24 hour  Intake 420 ml  Output 3000 ml  Net -2580 ml    Filed Weights   05/03/22 0500 05/03/22 1322 05/03/22 1809  Weight: 117.9 kg 117.9 kg 115.1 kg    Examination:  General exam: NAD Respiratory system: Normal resp failure Central nervous system: Alert, follows command Extremities: BL BKA Skin: Multiples wound  Data Reviewed: I have personally reviewed following labs and imaging  studies  CBC: Recent Labs  Lab 04/29/22 1441  WBC 6.4  HGB 8.3*  HCT 29.3*  MCV 96.7  PLT 128    Basic Metabolic Panel: Recent Labs  Lab 04/29/22 1441 05/02/22 0826  NA 138 134*  K 6.0* 5.3*  CL 99 96*  CO2 25 26  GLUCOSE 85 78  BUN 57* 31*  CREATININE 9.07* 6.00*  CALCIUM 9.9 9.7  PHOS 5.7*  --     GFR: Estimated Creatinine Clearance: 14 mL/min (A) (by C-G formula based on SCr of 6 mg/dL (H)). Liver Function Tests: Recent Labs  Lab 04/29/22 1441  ALBUMIN 3.3*    No results for input(s): "LIPASE", "AMYLASE" in the last 168 hours. No results for input(s): "AMMONIA" in the last 168 hours. Coagulation Profile: No results for input(s): "INR", "PROTIME" in the last 168 hours. Cardiac Enzymes: No results for input(s): "CKTOTAL", "CKMB", "CKMBINDEX", "TROPONINI" in the last 168 hours. BNP (last 3 results) No results for input(s): "PROBNP" in the last 8760 hours. HbA1C: No results for input(s): "HGBA1C" in the last 72 hours. CBG: No results for input(s): "GLUCAP" in the last 168 hours. Lipid Profile: No  results for input(s): "CHOL", "HDL", "LDLCALC", "TRIG", "CHOLHDL", "LDLDIRECT" in the last 72 hours. Thyroid Function Tests: No results for input(s): "TSH", "T4TOTAL", "FREET4", "T3FREE", "THYROIDAB" in the last 72 hours. Anemia Panel: No results for input(s): "VITAMINB12", "FOLATE", "FERRITIN", "TIBC", "IRON", "RETICCTPCT" in the last 72 hours. Sepsis Labs: No results for input(s): "PROCALCITON", "LATICACIDVEN" in the last 168 hours.  No results found for this or any previous visit (from the past 240 hour(s)).       Radiology Studies: No results found.      Scheduled Meds:  apixaban  2.5 mg Oral BID   aspirin EC  81 mg Oral Daily   atorvastatin  10 mg Oral Daily   azithromycin  250 mg Oral Daily   darbepoetin (ARANESP) injection - DIALYSIS  200 mcg Subcutaneous Q Tue-1800   docusate sodium  100 mg Oral BID   famotidine  20 mg Oral Daily    feeding supplement  1 Container Oral TID BM   fentaNYL  1 patch Transdermal Q72H   gabapentin  100 mg Oral QHS   Gerhardt's butt cream   Topical BID   levothyroxine  100 mcg Oral Q0600   midodrine  10 mg Oral Once per day on Tue Thu Sat   pantoprazole  40 mg Oral Daily   pregabalin  50 mg Oral Daily   sevelamer carbonate  4.8 g Oral TID WC   sucroferric oxyhydroxide  500 mg Oral TID WC   tobramycin   Left Eye TID   Continuous Infusions:  sodium thiosulfate 12.5 g in sodium chloride 0.9 % 150 mL Infusion for Calciphylaxis Stopped (05/03/22 1749)   sodium thiosulfate 12.5 g in sodium chloride 0.9 % 150 mL Infusion for Calciphylaxis       LOS: 184 days    Time spent: 35 minutes    Kaydyn Sayas A Terica Yogi, MD [facility]   If 7PM-7AM, please contact night-coverage [email or web address]  05/04/2022, 2:08 PM

## 2022-05-05 DIAGNOSIS — N186 End stage renal disease: Secondary | ICD-10-CM | POA: Diagnosis not present

## 2022-05-05 NOTE — Progress Notes (Signed)
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    PT Comments    Has not been out of bed for several days due to visual issues from a left eye stye. States it is improving greatly and eager to get into power w/c. Requesting shrinker socks for residual limbs in order to prepare for potential prosthesis use after d/c with continued rehab. Will coordinate with team today. Pt mod I with bed mobility. Supervision with transfer, set-up provided with teach back for recall and independence. Assist required to donne shorts - pt able to instruct therapist and roll with minor cues. Patient will continue to benefit from skilled physical therapy services to further improve independence with functional mobility. She sets a 30/10 min timer for pressure relief on her watch but needs reminded to set it once up in power-w/c. She has not been consistent with HEP but states she worked on exercises  "a little."     Recommendations for follow up therapy are one component of a multi-disciplinary discharge planning process, led by the attending physician.  Recommendations may be updated based on patient status, additional functional criteria and insurance authorization.  Follow Up Recommendations  Home health PT (consider PACE services vs SNF pending housing) Can patient physically be transported by private vehicle: No   Assistance Recommended at Discharge Intermittent Supervision/Assistance  Patient can return home with the following A little help with walking and/or transfers;Help with stairs  or ramp for entrance;Assist for transportation (medical [name] transport)   Financial risk analyst (measurements PT);Wheelchair cushion (measurements PT);Other (comment);Hospital bed (more accomodating mechanical lift, with sling that handles pt's with bil AKAs;  cushion (20x24), power wheelchair with power tilt and recline)    Recommendations for Other Services       Precautions / Restrictions Precautions Precautions: Fall Precaution Comments: bil AKA, 2L O2 at baseline (has been tolerating mobility in power chair on [employer] fine); when in power chair, MUST perform pressure relief every 30 minutes by tilting back for 10 minutes Restrictions Other Position/Activity Restrictions: when in power chair, MUST perform pressure relief every 30 minutes by tilting back for 10 minutes     Mobility  Bed Mobility Overal bed mobility: Modified Independent             General bed mobility comments: no assist needed with bed mobility.    Transfers Overall transfer level: Needs assistance Equipment used: None (Pt had on shorts) Transfers: Bed to chair/wheelchair/BSC         Anterior-Posterior transfers: Supervision   General transfer comment: Performed posterior scoot from bed to power w/c. No physical assist required. Cues for UE bracing to reduce friction on wound site. Pt required set-up. had pt teach back for independence and recall.    Ambulation/Gait               General Gait Details: unable   Theme park manager mobility: Yes Wheelchair propulsion:  (Power) Wheelchair Assistance Details (indicate cue type and reason): Indpendent  Modified Rankin (Stroke Patients  Only)       Balance Overall balance assessment: Modified Independent Sitting-balance support: No upper extremity supported Sitting balance-[name] Scale: Good Sitting balance - Comments: Able to sit unsupported in bed and powerchair  without difficulties                                    Cognition Arousal/Alertness: Awake/alert Behavior During Therapy: WFL for tasks assessed/performed Overall Cognitive Status: Within Functional Limits for tasks assessed                                          Exercises      General Comments General comments (skin integrity, edema, etc.): Assisted with donning shorts. Pt needs some help to adjust and place, esp in the back.      Pertinent Vitals/Pain Pain Assessment Pain Assessment: Faces Faces Pain Scale: Hurts little more Pain Location: R abdomen Pain Descriptors / Indicators: Aching, Discomfort Pain Intervention(s): Monitored during session, Repositioned    Home Living                          Prior Function            PT Goals (current goals can now be found in the care plan section) Acute Rehab PT Goals Patient Stated Goal: Reports wants to be up in power chair and be more independent PT Goal Formulation: With patient Time For Goal Achievement: 05/15/22 Potential to Achieve Goals: Good Progress towards PT goals: Progressing toward goals    Frequency    Min 3X/week      PT Plan Current plan remains appropriate (pt and her daughter must work on finding accessible housing)    Co-evaluation              AM-PAC PT "6 Clicks" Mobility   Outcome Measure  Help needed turning from your back to your side while in a flat bed without using bedrails?: None Help needed moving from lying on your back to sitting on the side of a flat bed without using bedrails?: None Help needed moving to and from a bed to a chair (including a wheelchair)?: A Little Help needed standing up from a chair using your arms (e.g., wheelchair or bedside chair)?: Total Help needed to walk in hospital room?: Total Help needed climbing 3-5 steps with a railing? : Total 6 Click Score: 14    End of Session   Activity Tolerance: Patient  tolerated treatment well Patient left:  (power w/c at NS) Nurse Communication: Mobility status PT Visit Diagnosis: Other abnormalities of gait and mobility (R26.89);Muscle weakness (generalized) (M62.81) Pain - Right/Left: Right Pain - part of body:  (R abdomen)     Time: 4431-5400 PT Time Calculation (min) (ACUTE ONLY): 23 min  Charges:  $Therapeutic Activity: 23-37 mins                     Candie Mile, PT, DPT Physical Therapist Acute Rehabilitation [facility] [place]    Ellouise Newer 05/05/2022, 11:19 AM

## 2022-05-05 NOTE — Plan of Care (Incomplete)
  Problem: Health Behavior/Discharge Planning: Goal: Ability to manage health-related needs will improve Outcome: Progressing   Problem: Clinical Measurements: Goal: Ability to maintain clinical measurements within normal limits will improve Outcome: Progressing Goal: Diagnostic test results will improve Outcome: Progressing Goal: Respiratory complications will improve Outcome: Progressing   Problem: Activity: Goal: Risk for activity intolerance will decrease Outcome: Progressing   Problem: Nutrition: Goal: Adequate nutrition will be maintained Outcome: Progressing

## 2022-05-05 NOTE — Progress Notes (Signed)
PROGRESS NOTE    Courtney Gray  ZOX:[id number] DOB: Nov 13, 1970 DOA: 11/01/2021 PCP: Monico Blitz, MD   Brief Narrative: 52 year old with past medical history significant for ESRD on hemodialysis TTS, recent hospitalization for bacteremia, hepatic and splenic abscess on long-term IV antibiotics, diastolic heart failure, PVD status post bilateral AKA, insulin-dependent diabetes type 2, morbid obesity and chronic sacral wound presents with progressive shortness of breath in the setting of missing hemodialysis and admitted for hypertensive emergency. Patient with long length of stay, discharge pending due to ability to obtain outpatient hemodialysis that can accumulate patient's mobility constraints secondary to decubitus ulcer wound and bilateral AKA.  Patient will need to be able to seat HD chair to be able to get out patient HD set up. Hospital HD unit is working on getting bariatric HD chair to be able to accommodate patient.     Assessment & Plan:   Active Problems:   End stage renal disease ([place])   Hypertension   Decubitus ulcer of sacral region, stage 3 (HCC)   Type 2 diabetes mellitus with hyperlipidemia (HCC)   Chronic respiratory failure with hypoxia (HCC)   Bacteremia   PVD (peripheral vascular disease) (HCC)   Class 3 obesity (HCC)   Anemia due to chronic kidney disease   Hypertensive emergency   Leukocytosis   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HCC)   Pressure injury of skin   PAD (peripheral artery disease) (HCC)   Irritable bowel syndrome (IBS)   Amputation of left middle finger   GERD (gastroesophageal reflux disease)   Nodule of skin of abdomen   1-ESRD, HD: -Nephrology Following, TTS schedule. - Needs Bariatric HD chair in the unit to accommodate patient.  -Working on tolerance sitting in the chair -Continue with midodrine.  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  She is now has been off of oral antibiotics. -Patient declined antibiotics, due to concern for GI issues.  She will continue with wound care. -Doxycycline was discontinued 12/1 -Local care.  -On fentanyl patch and  Oxycodone for PRN pain.  -Continue with wound care.   3-Calciphylaxis: Abdominal wound.  Na-thiosulfate  per nephrology As needed oxycodone Continue with wound care.  She declines further imagines.   4-Hypotension during HD: Continue with midodrine on hemodialysis days Monitor BP.   Phantom limb pain: Continue with Lyrica, gabapentin.   Hyperkalemia; correction with HD.   Decubitus ulcer of sacral region stage III: Continue with wound care, appreciate wound care recommendations.   Hypothyroidism: Continue with Synthroid Diabetes type 2 with hyperlipidemia neuropathy: Continue with aspirin and statins Bacteremia: Patient has completed antibiotic therapy   PVD (peripheral vascular disease) ([place]) History of Bilateral AKA   Class 3 obesity (HCC) BMI >40    Chronic respiratory failure Due to morbid obesity  Constipation; PRN Dulcolax suppository  Stye, Blepharitis. : on topical antibiotics, warm compress.  Started  artificial tear and oral Azithromycin for 5 days on 1/27. Notice some improvement today   Pressure Injury 11/12/21 Hip Right Unstageable - Full thickness tissue loss in which the base of the injury is covered by slough (yellow, tan, gray, green or brown) and/or eschar (tan, brown or black) in the wound bed. 90% covered with slough (Active)  11/12/21 0800  Location: Hip  Location Orientation: Right  Staging: Unstageable - Full thickness tissue loss in which the base of the injury is covered by slough (yellow, tan, gray, green  or brown) and/or eschar (tan, brown or black) in the wound bed.  Wound Description (Comments): 90% covered with slough  Present on Admission: Yes  Dressing Type Foam - Lift dressing to assess site every  shift 05/04/22 2200     Pressure Injury 11/24/21 Flank Left;Lower Stage 2 -  Partial thickness loss of dermis presenting as a shallow open injury with a red, pink wound bed without slough. (Active)  11/24/21 0430  Location: Flank  Location Orientation: Left;Lower  Staging: Stage 2 -  Partial thickness loss of dermis presenting as a shallow open injury with a red, pink wound bed without slough.  Wound Description (Comments):   Present on Admission: No  Dressing Type Foam - Lift dressing to assess site every shift 05/04/22 2200      Estimated body mass index is 42.23 kg/m as calculated from the following:   Height as of this encounter: 5\' 5"  (1.651 m).   Weight as of this encounter: 115.1 kg.   DVT prophylaxis: Aspirin Code Status: Full code Family Communication: Care discussed with  patient Disposition Plan:  Status is: Inpatient Remains inpatient appropriate because: Awaiting out patient HD set up, she needs to be able to sit for HD    Consultants:  Nephrology ID   Procedures:    Antimicrobials:    Subjective: Eyelid with less swelling, had BM yesterday, think might need another suppository today  Objective: Vitals:   05/04/22 1825 05/04/22 2036 05/05/22 0541 05/05/22 0938  BP: (!) 123/51 111/61 (!) 148/58 132/65  Pulse: (!) 59 (!) 58 (!) 54 (!) 58  Resp: 18 18 18    Temp: 98.1 F (36.7 C) 98.1 F (36.7 C) 98.2 F (36.8 C) 98.3 F (36.8 C)  TempSrc:  Oral Oral Oral  SpO2: 99% 90% 100% 100%  Weight:      Height:        Intake/Output Summary (Last 24 hours) at 05/05/2022 1359 Last data filed at 05/05/2022 0600 Gross per 24 hour  Intake 540 ml  Output 0 ml  Net 540 ml    Filed Weights   05/03/22 0500 05/03/22 1322 05/03/22 1809  Weight: 117.9 kg 117.9 kg 115.1 kg    Examination:  General exam: NAD Respiratory system: Normal Resp effort Central nervous system: Alert, follows command Extremities: BL BKA Skin: Multiples wound  Data Reviewed: I  have personally reviewed following labs and imaging studies  CBC: Recent Labs  Lab 04/29/22 1441  WBC 6.4  HGB 8.3*  HCT 29.3*  MCV 96.7  PLT 161    Basic Metabolic Panel: Recent Labs  Lab 04/29/22 1441 05/02/22 0826  NA 138 134*  K 6.0* 5.3*  CL 99 96*  CO2 25 26  GLUCOSE 85 78  BUN 57* 31*  CREATININE 9.07* 6.00*  CALCIUM 9.9 9.7  PHOS 5.7*  --     GFR: Estimated Creatinine Clearance: 14 mL/min (A) (by C-G formula based on SCr of 6 mg/dL (H)). Liver Function Tests: Recent Labs  Lab 04/29/22 1441  ALBUMIN 3.3*    No results for input(s): "LIPASE", "AMYLASE" in the last 168 hours. No results for input(s): "AMMONIA" in the last 168 hours. Coagulation Profile: No results for input(s): "INR", "PROTIME" in the last 168 hours. Cardiac Enzymes: No results for input(s): "CKTOTAL", "CKMB", "CKMBINDEX", "TROPONINI" in the last 168 hours. BNP (last 3 results) No results for input(s): "PROBNP" in the last 8760 hours. HbA1C: No results for input(s): "HGBA1C" in the last 72 hours. CBG: No results for  input(s): "GLUCAP" in the last 168 hours. Lipid Profile: No results for input(s): "CHOL", "HDL", "LDLCALC", "TRIG", "CHOLHDL", "LDLDIRECT" in the last 72 hours. Thyroid Function Tests: No results for input(s): "TSH", "T4TOTAL", "FREET4", "T3FREE", "THYROIDAB" in the last 72 hours. Anemia Panel: No results for input(s): "VITAMINB12", "FOLATE", "FERRITIN", "TIBC", "IRON", "RETICCTPCT" in the last 72 hours. Sepsis Labs: No results for input(s): "PROCALCITON", "LATICACIDVEN" in the last 168 hours.  No results found for this or any previous visit (from the past 240 hour(s)).       Radiology Studies: No results found.      Scheduled Meds:  apixaban  2.5 mg Oral BID   aspirin EC  81 mg Oral Daily   atorvastatin  10 mg Oral Daily   azithromycin  250 mg Oral Daily   darbepoetin (ARANESP) injection - DIALYSIS  200 mcg Subcutaneous Q Tue-1800   docusate sodium  100  mg Oral BID   famotidine  20 mg Oral Daily   feeding supplement  1 Container Oral TID BM   fentaNYL  1 patch Transdermal Q72H   gabapentin  100 mg Oral QHS   Gerhardt's butt cream   Topical BID   levothyroxine  100 mcg Oral Q0600   midodrine  10 mg Oral Once per day on Tue Thu Sat   pantoprazole  40 mg Oral Daily   pregabalin  50 mg Oral Daily   sevelamer carbonate  4.8 g Oral TID WC   sucroferric oxyhydroxide  500 mg Oral TID WC   tobramycin   Left Eye TID   Continuous Infusions:  sodium thiosulfate 12.5 g in sodium chloride 0.9 % 150 mL Infusion for Calciphylaxis Stopped (05/03/22 1749)   sodium thiosulfate 12.5 g in sodium chloride 0.9 % 150 mL Infusion for Calciphylaxis       LOS: 185 days    Time spent: 35 minutes    Alexa Golebiewski A Aileana Hodder, MD [facility]   If 7PM-7AM, please contact night-coverage [email or web address]  05/05/2022, 1:59 PM

## 2022-05-05 NOTE — Progress Notes (Signed)
Mobility Specialist Progress Note:    05/05/22 1200  Mobility  Activity Transferred from chair to bed  Level of Assistance Contact guard assist, steadying assist  Assistive Device None  Activity Response Tolerated well  Mobility Referral Yes  $Mobility charge 1 Mobility   Pt requested transfer from power chair. Anterior scoot required to return to bed. CGA required to hold down bed pad to ease transfer. Left pt in bed with all needs met.  Royetta Crochet Mobility Specialist Please contact via Solicitor or  Rehab office at [phone]

## 2022-05-05 NOTE — Progress Notes (Signed)
[facility] Progress Note   Subjective:  Seen in room. No CP/dyspnea. Tells me she has a small pinhole opening in L abdominal wound - encouraged to show wound care when bandage is changed. She is also dealing with L eye stye - has topical and PO abx.  Objective Vitals:   05/04/22 1825 05/04/22 2036 05/05/22 0541 05/05/22 0938  BP: (!) 123/51 111/61 (!) 148/58 132/65  Pulse: (!) 59 (!) 58 (!) 54 (!) 58  Resp: 18 18 18    Temp: 98.1 F (36.7 C) 98.1 F (36.7 C) 98.2 F (36.8 C) 98.3 F (36.8 C)  TempSrc:  Oral Oral Oral  SpO2: 99% 90% 100% 100%  Weight:      Height:       Physical Exam General: Well appearing obese woman, NAD. Heart: RRR; no murmur Lungs: CTAB; no wheezing Abdomen: bandages to B lateral pannus Extremities: B AKA; no stump edema Dialysis Access: [facility]  Additional Objective Labs: Basic Metabolic Panel: Recent Labs  Lab 04/29/22 1441 05/02/22 0826  NA 138 134*  K 6.0* 5.3*  CL 99 96*  CO2 25 26  GLUCOSE 85 78  BUN 57* 31*  CREATININE 9.07* 6.00*  CALCIUM 9.9 9.7  PHOS 5.7*  --    Liver Function Tests: Recent Labs  Lab 04/29/22 1441  ALBUMIN 3.3*   CBC: Recent Labs  Lab 04/29/22 1441  WBC 6.4  HGB 8.3*  HCT 29.3*  MCV 96.7  PLT 190   Medications:  sodium thiosulfate 12.5 g in sodium chloride 0.9 % 150 mL Infusion for Calciphylaxis Stopped (05/03/22 1749)   sodium thiosulfate 12.5 g in sodium chloride 0.9 % 150 mL Infusion for Calciphylaxis      apixaban  2.5 mg Oral BID   aspirin EC  81 mg Oral Daily   atorvastatin  10 mg Oral Daily   azithromycin  250 mg Oral Daily   darbepoetin (ARANESP) injection - DIALYSIS  200 mcg Subcutaneous Q Tue-1800   docusate sodium  100 mg Oral BID   famotidine  20 mg Oral Daily   feeding supplement  1 Container Oral TID BM   fentaNYL  1 patch Transdermal Q72H   gabapentin  100 mg Oral QHS   Gerhardt's butt cream   Topical BID   levothyroxine  100 mcg Oral Q0600   midodrine  10 mg Oral  Once per day on Tue Thu Sat   pantoprazole  40 mg Oral Daily   pregabalin  50 mg Oral Daily   sevelamer carbonate  4.8 g Oral TID WC   sucroferric oxyhydroxide  500 mg Oral TID WC   tobramycin   Left Eye TID    Dialysis Orders: Not yet a candidate for OP HD.  Assessment/Plan: Debility: Has been unable to tolerate dialysis in recliner and therefore unable to discharge to outpatient dialysis. She has lost significant weight this admit (> 50 lbs). Sacral decub ulcers remain a barrier. She now has new powerchair/cushion and has been sitting for longer periods.  Appreciate efforts from PT team.   ESRD: Continue HD on TTS schedule - she prefers afternoon or night shift if schedule allows. HD tomorrow. 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 No calcium based products or vitamin D. Gets 1/2 dose NaThiosulfate 12.5g q HD - could not tolerate higher dose. HTN /volume: On midodrine 10mg  qHD, with 10mg  prn mid HD w/parameters to keep SBP >100 d/t becoming symptomatic when it drops lower. Significant weight loss  this admit Severe ischial sacral decubitus wound: CT showing chronic osteomyelitis. Limiting her, noted to be slow healing. Goal to tolerate sitting upright - which she is doing with PT.  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 Recent Fe load completed.  Secondary hyperparathyroidism: On two binders. CorrCa elevated. Not on VDRA, last PTH 86 (low) on 03/11/22 -> repeat. Avoid all calcium based medications for Vitamin D d/t concern for calciphylaxis.  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 Nutrition - Alb 3.3, on regular diet.  Would really prefer she be on a renal diet but she refuses. Disposition: Difficult situation. Prior to admit, patient required 6 staff members and 2 hoyer pads for transfer into HD chair. Could not stay on HD for  full tx. 2/2 pain of multiple decubitus ulcers and PTAR unable to lift pt into ambulance for transport.SW working on LTC at [employer]. Will need to tolerate 5-6hrs in a chair to tolerate OP dialysis including transport, treatment and cannulation time.  The main issue is her Rt ischial wound. She now has new power chair and cushion. She has been sitting in the chair for longer periods. Up to 4 hours past week.   [name], PA-C 05/05/2022, 11:02 AM  [employer]

## 2022-05-05 NOTE — Progress Notes (Signed)
Orthopedic Tech Progress Note Patient Details:  Courtney Gray [birth year] [id number]  Called in order to [employer] for a pair of AKA SHRINKERS   Patient ID: Courtney Gray, female   DOB: May 29, 1970, 52 y.o.   MRN: [id number]  Courtney Gray 05/05/2022, 12:31 PM

## 2022-05-06 DIAGNOSIS — N186 End stage renal disease: Secondary | ICD-10-CM | POA: Diagnosis not present

## 2022-05-06 LAB — RENAL FUNCTION PANEL
Albumin: 3.4 g/dL — ABNORMAL LOW (ref 3.5–5.0)
Albumin: 3.4 g/dL — ABNORMAL LOW (ref 3.5–5.0)
Anion gap: 12 (ref 5–15)
Anion gap: 13 (ref 5–15)
BUN: 67 mg/dL — ABNORMAL HIGH (ref 6–20)
BUN: 21 mg/dL — ABNORMAL HIGH (ref 6–20)
CO2: 26 mmol/L (ref 22–32)
CO2: 26 mmol/L (ref 22–32)
Calcium: 9.8 mg/dL (ref 8.9–10.3)
Calcium: 8.7 mg/dL — ABNORMAL LOW (ref 8.9–10.3)
Chloride: 98 mmol/L (ref 98–111)
Chloride: 94 mmol/L — ABNORMAL LOW (ref 98–111)
Creatinine, Ser: 9.64 mg/dL — ABNORMAL HIGH (ref 0.44–1.00)
Creatinine, Ser: 3.96 mg/dL — ABNORMAL HIGH (ref 0.44–1.00)
GFR, Estimated: 4 mL/min — ABNORMAL LOW
GFR, Estimated: 13 mL/min — ABNORMAL LOW
Glucose, Bld: 81 mg/dL (ref 70–99)
Glucose, Bld: 124 mg/dL — ABNORMAL HIGH (ref 70–99)
Phosphorus: 6.8 mg/dL — ABNORMAL HIGH (ref 2.5–4.6)
Phosphorus: 2 mg/dL — ABNORMAL LOW (ref 2.5–4.6)
Potassium: 5.7 mmol/L — ABNORMAL HIGH (ref 3.5–5.1)
Potassium: 3.4 mmol/L — ABNORMAL LOW (ref 3.5–5.1)
Sodium: 136 mmol/L (ref 135–145)
Sodium: 133 mmol/L — ABNORMAL LOW (ref 135–145)

## 2022-05-06 LAB — CBC
HCT: 30.3 % — ABNORMAL LOW (ref 36.0–46.0)
Hemoglobin: 8.7 g/dL — ABNORMAL LOW (ref 12.0–15.0)
MCH: 27.4 pg (ref 26.0–34.0)
MCHC: 28.7 g/dL — ABNORMAL LOW (ref 30.0–36.0)
MCV: 95.3 fL (ref 80.0–100.0)
Platelets: 214 K/uL (ref 150–400)
RBC: 3.18 MIL/uL — ABNORMAL LOW (ref 3.87–5.11)
RDW: 16.5 % — ABNORMAL HIGH (ref 11.5–15.5)
WBC: 7.2 K/uL (ref 4.0–10.5)
nRBC: 0 % (ref 0.0–0.2)

## 2022-05-06 MED ORDER — HEPARIN SODIUM (PORCINE) 1000 UNIT/ML IJ SOLN
4200.0000 [IU] | Freq: Once | INTRAMUSCULAR | Status: AC
  Administered 2022-05-14: 4200 [IU]
  Filled 2022-05-06: qty 5

## 2022-05-06 NOTE — Procedures (Signed)
Patient was seen on dialysis and the procedure was supervised.  BFR 400  Via TDC BP is  110/61.   Patient appears to be tolerating treatment well. Cath site looks clean.   Courtney Gray Tanna Furry 05/06/2022

## 2022-05-06 NOTE — Progress Notes (Addendum)
Received patient in bed to unit.  Alert and oriented.  Informed consent signed and in chart.   Treatment initiated: 09:09 Treatment completed: 13:21  Patient tolerated well.  Transported back to the room  Alert, without acute distress.  Hand-off given to patient's nurse.   Access used: left [facility] Access issues: none  Total UF removed: 2.9L Medication(s) given: oxy, sodium thiosulfate    05/06/22 1321  Vitals  Temp 98 F (36.7 C)  Temp Source Oral  BP 101/70  MAP (mmHg) 80  BP Location Right Wrist  BP Method Automatic  Patient Position (if appropriate) Lying  Pulse Rate 60  Pulse Rate Source Monitor  ECG Heart Rate 64  Resp 11  Oxygen Therapy  SpO2 98 %  O2 Device Nasal Cannula  Patient Activity (if Appropriate) In bed  Pulse Oximetry Type Continuous  During Treatment Monitoring  HD Safety Checks Performed Yes  Intra-Hemodialysis Comments Tx completed;Tolerated well  Post Treatment  Dialyzer Clearance Lightly streaked  Duration of HD Treatment -hour(s) 4 hour(s)  Liters Processed 96  Fluid Removed (mL) 2.9 mL  Tolerated HD Treatment Yes  Note  Observations pt alert,stable  Hemodialysis Catheter Left Internal jugular Double lumen Permanent (Tunneled)  Placement Date/Time: 03/07/22 1105   Serial / Lot #: [id number]  Expiration Date: 09/09/26  Time Out: Correct patient;Correct site;Correct procedure  Maximum sterile barrier precautions: Hand hygiene;Cap;Mask;Sterile gown;Sterile gloves;Large sterile ...  Site Condition No complications  Blue Lumen Status Heparin locked;Dead end cap in place;Flushed  Red Lumen Status Heparin locked;Dead end cap in place;Flushed  Purple Lumen Status N/A  Catheter fill solution Heparin 1000 units/ml  Catheter fill volume (Arterial) 2.1 cc  Catheter fill volume (Venous) 2.1  Dressing Type Transparent  Dressing Status Antimicrobial disc in place  Drainage Description None  Post treatment catheter status Capped and Clamped  Vitals   Weight 117.2 kg  Height and Weight  Type of Weight Post-Dialysis      Harbour Nordmeyer S [facility]

## 2022-05-06 NOTE — Progress Notes (Signed)
Dialysis staff called for patient to come to dialysis for morning rounds. Patient refused to come to dialysis this morning. Staff will try again if time permits.

## 2022-05-06 NOTE — Progress Notes (Signed)
PROGRESS NOTE    Courtney Gray  EEF:[id number] DOB: Aug 08, 1970 DOA: 11/01/2021 PCP: Monico Blitz, MD   Brief Narrative: 52 year old with past medical history significant for ESRD on hemodialysis TTS, recent hospitalization for bacteremia, hepatic and splenic abscess on long-term IV antibiotics, diastolic heart failure, PVD status post bilateral AKA, insulin-dependent diabetes type 2, morbid obesity and chronic sacral wound presents with progressive shortness of breath in the setting of missing hemodialysis and admitted for hypertensive emergency. Patient with long length of stay, discharge pending due to ability to obtain outpatient hemodialysis that can accumulate patient's mobility constraints secondary to decubitus ulcer wound and bilateral AKA.  Patient will need to be able to seat HD chair to be able to get out patient HD set up. Hospital HD unit is working on getting bariatric HD chair to be able to accommodate patient.     Assessment & Plan:   Active Problems:   End stage renal disease ([place])   Hypertension   Decubitus ulcer of sacral region, stage 3 (HCC)   Type 2 diabetes mellitus with hyperlipidemia (HCC)   Chronic respiratory failure with hypoxia (HCC)   Bacteremia   PVD (peripheral vascular disease) (HCC)   Class 3 obesity (HCC)   Anemia due to chronic kidney disease   Hypertensive emergency   Leukocytosis   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HCC)   Pressure injury of skin   PAD (peripheral artery disease) (HCC)   Irritable bowel syndrome (IBS)   Amputation of left middle finger   GERD (gastroesophageal reflux disease)   Nodule of skin of abdomen   1-ESRD, HD: -Nephrology Following, TTS schedule. - Needs Bariatric HD chair in the unit to accommodate patient.  -Working on tolerance sitting in the chair -Continue with midodrine.  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  She is now has been off of oral antibiotics. -Patient declined antibiotics, due to concern for GI issues.  She will continue with wound care. -Doxycycline was discontinued 12/1 -Local care.  -On fentanyl patch and  Oxycodone for PRN pain.  -Continue with wound care.   3-Calciphylaxis: Abdominal wound.  Na-thiosulfate  per nephrology As needed oxycodone Continue with wound care.  She declines further imagines.   4-Hypotension during HD: Continue with midodrine on hemodialysis days Monitor BP.   Phantom limb pain: Continue with Lyrica, gabapentin.   Hyperkalemia; correction with HD.   Decubitus ulcer of sacral region stage III: Continue with wound care, appreciate wound care recommendations.   Hypothyroidism: Continue with Synthroid Diabetes type 2 with hyperlipidemia neuropathy: Continue with aspirin and statins Bacteremia: Patient has completed antibiotic therapy   PVD (peripheral vascular disease) ([place]) History of Bilateral AKA   Class 3 obesity (HCC) BMI >40    Chronic respiratory failure Due to morbid obesity  Constipation; PRN Dulcolax suppository  Stye, Blepharitis. : on topical antibiotics, warm compress.  Started  artificial tear and oral Azithromycin for 5 days on 1/27. Improving.   Pressure Injury 11/12/21 Hip Right Unstageable - Full thickness tissue loss in which the base of the injury is covered by slough (yellow, tan, gray, green or brown) and/or eschar (tan, brown or black) in the wound bed. 90% covered with slough (Active)  11/12/21 0800  Location: Hip  Location Orientation: Right  Staging: Unstageable - Full thickness tissue loss in which the base of the injury is covered by slough (yellow, tan, gray, green or brown) and/or  eschar (tan, brown or black) in the wound bed.  Wound Description (Comments): 90% covered with slough  Present on Admission: Yes  Dressing Type Foam - Lift dressing to assess site every shift 05/06/22 0808      Pressure Injury 11/24/21 Flank Left;Lower Stage 2 -  Partial thickness loss of dermis presenting as a shallow open injury with a red, pink wound bed without slough. (Active)  11/24/21 0430  Location: Flank  Location Orientation: Left;Lower  Staging: Stage 2 -  Partial thickness loss of dermis presenting as a shallow open injury with a red, pink wound bed without slough.  Wound Description (Comments):   Present on Admission: No  Dressing Type Foam - Lift dressing to assess site every shift 05/06/22 0808      Estimated body mass index is 43 kg/m as calculated from the following:   Height as of this encounter: 5\' 5"  (1.651 m).   Weight as of this encounter: 117.2 kg.   DVT prophylaxis: Aspirin Code Status: Full code Family Communication: Care discussed with  patient Disposition Plan:  Status is: Inpatient Remains inpatient appropriate because: Awaiting out patient HD set up, she needs to be able to sit for HD    Consultants:  Nephrology ID   Procedures:    Antimicrobials:    Subjective: Eyelid swelling improving.  Seen in HD.  No new complaints.   Objective: Vitals:   05/06/22 1230 05/06/22 1300 05/06/22 1321 05/06/22 1455  BP: 107/78 (!) 126/102 101/70 (!) 138/35  Pulse: 62 67 60 (!) 49  Resp: 13 17 11    Temp:   98 F (36.7 C) 97.7 F (36.5 C)  TempSrc:   Oral Oral  SpO2: 98% 98% 98% 93%  Weight:   117.2 kg   Height:        Intake/Output Summary (Last 24 hours) at 05/06/2022 1512 Last data filed at 05/06/2022 1321 Gross per 24 hour  Intake 120 ml  Output 2.9 ml  Net 117.1 ml    Filed Weights   05/03/22 1809 05/06/22 0900 05/06/22 1321  Weight: 115.1 kg 120.1 kg 117.2 kg    Examination:  General exam: NAD Respiratory system: Normal Respiratory effort.  Central nervous system: Alert, follows command Extremities: BL BKA Skin: Multiples wound  Data Reviewed: I have personally reviewed following labs and imaging studies  CBC: Recent Labs   Lab 05/06/22 0854  WBC 7.2  HGB 8.7*  HCT 30.3*  MCV 95.3  PLT 665    Basic Metabolic Panel: Recent Labs  Lab 05/02/22 0826 05/06/22 0854  NA 134* 136  K 5.3* 5.7*  CL 96* 98  CO2 26 26  GLUCOSE 78 81  BUN 31* 67*  CREATININE 6.00* 9.64*  CALCIUM 9.7 9.8  PHOS  --  6.8*    GFR: Estimated Creatinine Clearance: 8.8 mL/min (A) (by C-G formula based on SCr of 9.64 mg/dL (H)). Liver Function Tests: Recent Labs  Lab 05/06/22 0854  ALBUMIN 3.4*    No results for input(s): "LIPASE", "AMYLASE" in the last 168 hours. No results for input(s): "AMMONIA" in the last 168 hours. Coagulation Profile: No results for input(s): "INR", "PROTIME" in the last 168 hours. Cardiac Enzymes: No results for input(s): "CKTOTAL", "CKMB", "CKMBINDEX", "TROPONINI" in the last 168 hours. BNP (last 3 results) No results for input(s): "PROBNP" in the last 8760 hours. HbA1C: No results for input(s): "HGBA1C" in the last 72 hours. CBG: No results for input(s): "GLUCAP" in the last 168 hours. Lipid Profile: No results  for input(s): "CHOL", "HDL", "LDLCALC", "TRIG", "CHOLHDL", "LDLDIRECT" in the last 72 hours. Thyroid Function Tests: No results for input(s): "TSH", "T4TOTAL", "FREET4", "T3FREE", "THYROIDAB" in the last 72 hours. Anemia Panel: No results for input(s): "VITAMINB12", "FOLATE", "FERRITIN", "TIBC", "IRON", "RETICCTPCT" in the last 72 hours. Sepsis Labs: No results for input(s): "PROCALCITON", "LATICACIDVEN" in the last 168 hours.  No results found for this or any previous visit (from the past 240 hour(s)).       Radiology Studies: No results found.      Scheduled Meds:  apixaban  2.5 mg Oral BID   aspirin EC  81 mg Oral Daily   atorvastatin  10 mg Oral Daily   azithromycin  250 mg Oral Daily   darbepoetin (ARANESP) injection - DIALYSIS  200 mcg Subcutaneous Q Tue-1800   docusate sodium  100 mg Oral BID   famotidine  20 mg Oral Daily   feeding supplement  1 Container  Oral TID BM   fentaNYL  1 patch Transdermal Q72H   gabapentin  100 mg Oral QHS   Gerhardt's butt cream   Topical BID   heparin sodium (porcine)  4,200 Units Intracatheter Once   levothyroxine  100 mcg Oral Q0600   midodrine  10 mg Oral Once per day on Tue Thu Sat   pantoprazole  40 mg Oral Daily   pregabalin  50 mg Oral Daily   sevelamer carbonate  4.8 g Oral TID WC   sucroferric oxyhydroxide  500 mg Oral TID WC   tobramycin   Left Eye TID   Continuous Infusions:  sodium thiosulfate 12.5 g in sodium chloride 0.9 % 150 mL Infusion for Calciphylaxis Stopped (05/06/22 1446)   sodium thiosulfate 12.5 g in sodium chloride 0.9 % 150 mL Infusion for Calciphylaxis       LOS: 186 days    Time spent: 35 minutes    Courtney Gray A Zoya Sprecher, MD [facility]   If 7PM-7AM, please contact night-coverage [email or web address]  05/06/2022, 3:12 PM

## 2022-05-06 NOTE — NC FL2 (Signed)
[place] LEVEL OF CARE FORM     IDENTIFICATION  Patient Name: Courtney Gray Birthdate: 08-11-1970 Sex: female Admission Date (Current Location): 11/01/2021  [place] and [place] Number:  Kathleen Argue [id number] [place] and Address:  [facility] [place] [address] [place], [place] [address]      Provider Number: [id number]  Attending Physician Name and Address:  Elmarie Shiley, MD  Relative Name and Phone Number:  Lauter,antoinette (Daughter) [phone]    Current Level of Care: Hospital Recommended Level of Care: [place] Prior Approval Number:    Date Approved/Denied:   PASRR Number: [id number]  Discharge Plan: SNF    Current Diagnoses: Patient Active Problem List   Diagnosis Date Noted   Nodule of skin of abdomen 02/16/2022   Acute on chronic systolic CHF (congestive heart failure) ([place]) 02/11/2022   Acute on chronic diastolic CHF (congestive heart failure) ([place]) 12/12/2021   PAD (peripheral artery disease) ([place]) 12/12/2021   Irritable bowel syndrome (IBS) 12/12/2021   Amputation of left middle finger 12/12/2021   GERD (gastroesophageal reflux disease) 12/12/2021   Hypertension    Type 2 diabetes mellitus with hyperlipidemia (HCC)    Decubitus ulcer of sacral region, stage 3 (HCC)    Class 3 obesity (HCC)    Bacteremia    PVD (peripheral vascular disease) (HCC)    Hepatic abscess    Pressure injury of skin 09/13/2021   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[place])    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   Lactic acidosis 09/10/2021   Mixed diabetic hyperlipidemia associated with type 2 diabetes mellitus ([place]) 09/10/2021   Coronary artery disease involving native coronary artery of native heart without angina pectoris 09/10/2021   Elevated troponin level not due myocardial infarction 09/10/2021   Hypotension 09/10/2021   Dry gangrene (HCC)    Pain 08/03/2021   Ischemic foot pain at rest 08/03/2021    Hypoglycemia 07/10/2021   Volume overload 07/09/2021   Leukocytosis 06/11/2021   Acute gout 06/11/2021   Hyperkalemia 05/21/2021   Hemorrhoids    Hyperlipidemia 05/18/2021   Rectal bleeding 05/17/2021   Morbid obesity with BMI of 60.0-69.9, adult ([place]) 05/17/2021   NSTEMI (non-ST elevated myocardial infarction) ([place]) 05/16/2021   Essential hypertension 09/01/2020   End stage renal disease ([place]) 09/01/2020   Chronic respiratory failure with hypoxia ([place]) 09/01/2020   Hypertensive emergency    Hypertensive urgency 11/28/2019   Uncontrolled type 2 diabetes mellitus with hypoglycemia, with long-term current use of insulin ([place]) 11/28/2019   Anemia due to chronic kidney disease 11/28/2019    Orientation RESPIRATION BLADDER Height & Weight     Self, Time, Situation, Place  O2 (2L) Continent Weight: 258 lb 6.1 oz (117.2 kg) Height:  5\' 5"  (165.1 cm)  BEHAVIORAL SYMPTOMS/MOOD NEUROLOGICAL BOWEL NUTRITION STATUS      Continent Diet (see d/c summary)  AMBULATORY STATUS COMMUNICATION OF NEEDS Skin   Limited Assist (wheelchair bound, only needing "a little" assistance per PT to move from bed to wheelchair) Verbally PU Stage and Appropriate Care (Right Hip- unstageable (foam, lift dressing 2x a day to assess); Lower Left Flank- stage 2 (foam, lift dressing 2x a day to assess); non pressure buttocks; non pressure anterior left)                       Personal Care Assistance Level of Assistance  Bathing, Feeding, Dressing Bathing Assistance: Maximum assistance Feeding assistance: Independent Dressing Assistance: Limited assistance  Functional Limitations Info  Sight, Hearing, Speech Sight Info: Impaired Hearing Info: Adequate Speech Info: Adequate    SPECIAL CARE FACTORS FREQUENCY  OT (By licensed OT), PT (By licensed PT)     PT Frequency: 5x/ week OT Frequency: 5x/ week            Contractures Contractures Info: Not present    Additional Factors Info  Code Status,  Allergies Code Status Info: FULL Allergies Info: Benzonatate  Hydralazine  Amoxicillin  Clonidine  Chlorhexidine  Morphine  Oxycodone   Insulin Sliding Scale Info: see d/c medication list       Current Medications (05/06/2022):  This is the current hospital active medication list Current Facility-Administered Medications  Medication Dose Route Frequency Provider Last Rate Last Admin   acetaminophen (TYLENOL) tablet 650 mg  650 mg Oral Q6H PRN Lavina Hamman, MD   650 mg at 05/01/22 5697   apixaban (ELIQUIS) tablet 2.5 mg  2.5 mg Oral BID Donnamae Jude, RPH   2.5 mg at 05/05/22 2346   artificial tears (LACRILUBE) ophthalmic ointment   Both Eyes Q4H PRN Regalado, Belkys A, MD       aspirin EC tablet 81 mg  81 mg Oral Daily Lavina Hamman, MD   81 mg at 05/05/22 1012   atorvastatin (LIPITOR) tablet 10 mg  10 mg Oral Daily Wynetta Fines T, MD   10 mg at 05/05/22 1013   azithromycin (ZITHROMAX) tablet 250 mg  250 mg Oral Daily Regalado, Belkys A, MD   250 mg at 05/05/22 1012   bisacodyl (DULCOLAX) suppository 10 mg  10 mg Rectal Daily PRN Pokhrel, [place], MD   10 mg at 05/04/22 1631   Darbepoetin Alfa (ARANESP) injection 200 mcg  200 mcg Subcutaneous Q Tue-1800 Hammons, Kimberly B, RPH   200 mcg at 04/29/22 2002   docusate sodium (COLACE) capsule 100 mg  100 mg Oral BID Rai, Ripudeep K, MD   100 mg at 05/05/22 2346   famotidine (PEPCID) tablet 20 mg  20 mg Oral Daily Pham, Minh Q, RPH-CPP   20 mg at 05/05/22 1013   feeding supplement (BOOST / RESOURCE BREEZE) liquid 1 Container  1 Container Oral TID BM Donne Hazel, MD   1 Container at 05/05/22 2200   fentaNYL (DURAGESIC) 25 MCG/HR 1 patch  1 patch Transdermal Q72H Regalado, Belkys A, MD   1 patch at 05/03/22 2232   gabapentin (NEURONTIN) capsule 100 mg  100 mg Oral QHS Edwin Dada, MD   100 mg at 05/05/22 2346   Gerhardt's butt cream   Topical BID Domenic Polite, MD   Given at 05/05/22 2347   heparin sodium (porcine) injection 4,200  Units  4,200 Units Intracatheter Once Stovall, Kathryn R, PA-C       iohexol (OMNIPAQUE) 300 MG/ML solution 100 mL  100 mL Intravenous Once PRN Suttle, Rosanne Ashing, MD       lactulose (CHRONULAC) 10 GM/15ML solution 30 g  30 g Oral BID PRN Wendee Beavers T, MD   30 g at 04/17/22 0021   levothyroxine (SYNTHROID) tablet 100 mcg  100 mcg Oral Q0600 Vernell Leep D, MD   100 mcg at 05/06/22 9480   loperamide (IMODIUM) capsule 2 mg  2 mg Oral PRN Mansy, Jan A, MD   2 mg at 03/05/22 2219   loratadine (CLARITIN) tablet 10 mg  10 mg Oral Daily PRN Mariel Aloe, MD       midodrine (PROAMATINE) tablet 10  mg  10 mg Oral Once per day on Tue Thu Sat Kaleen Mask, [place]   10 mg at 05/06/22 5027   ondansetron (ZOFRAN-ODT) disintegrating tablet 4 mg  4 mg Oral Q6H PRN Arrien, Jimmy Picket, MD   4 mg at 03/28/22 1710   Oral care mouth rinse  15 mL Mouth Rinse PRN Eugenie Filler, MD       oxyCODONE (Oxy IR/ROXICODONE) immediate release tablet 5 mg  5 mg Oral Q4H PRN Arrien, Jimmy Picket, MD   5 mg at 05/06/22 1316   pantoprazole (PROTONIX) EC tablet 40 mg  40 mg Oral Daily Lavina Hamman, MD   40 mg at 05/05/22 1013   polyethylene glycol (MIRALAX / GLYCOLAX) packet 17 g  17 g Oral Daily PRN Domenic Polite, MD   17 g at 02/06/22 0839   pregabalin (LYRICA) capsule 50 mg  50 mg Oral Daily Edwin Dada, MD   50 mg at 05/05/22 1013   sevelamer carbonate (RENVELA) packet 4.8 g  4.8 g Oral TID WC Arrien, Jimmy Picket, MD   4.8 g at 05/05/22 1728   simethicone (MYLICON) chewable tablet 80 mg  80 mg Oral QID PRN Mariel Aloe, MD   80 mg at 05/04/22 0846   sodium thiosulfate 12.5 g in sodium chloride 0.9 % 150 mL Infusion for Calciphylaxis  12.5 g Intravenous Q T,Th,Sa-HD Roney Jaffe, MD 150 mL/hr at 05/06/22 1209 12.5 g at 05/06/22 1209   sodium thiosulfate 12.5 g in sodium chloride 0.9 % 150 mL Infusion for Calciphylaxis  12.5 g Intravenous Once Thurnell Lose, MD       sucroferric  oxyhydroxide [facility]) chewable tablet 500 mg  500 mg Oral TID WC Stephania Fragmin R, PA-C   500 mg at 05/05/22 1728   tobramycin (TOBREX) 0.3 % ophthalmic ointment   Left Eye TID Thurnell Lose, MD   Given at 05/06/22 1025     Discharge Medications: Please see discharge summary for a list of discharge medications.  Relevant Imaging Results:  Relevant Lab Results:   Additional Information [id number], Both Covids 1 Booster, 258lbs 5'5";  needs to be clipped to dialysis clinic  Jeremy Mclamb F Emberli Ballester, LCSWA

## 2022-05-07 DIAGNOSIS — I10 Essential (primary) hypertension: Secondary | ICD-10-CM | POA: Diagnosis not present

## 2022-05-07 DIAGNOSIS — N186 End stage renal disease: Secondary | ICD-10-CM | POA: Diagnosis not present

## 2022-05-07 DIAGNOSIS — L89153 Pressure ulcer of sacral region, stage 3: Secondary | ICD-10-CM | POA: Diagnosis not present

## 2022-05-07 DIAGNOSIS — E1169 Type 2 diabetes mellitus with other specified complication: Secondary | ICD-10-CM | POA: Diagnosis not present

## 2022-05-07 LAB — PARATHYROID HORMONE, INTACT (NO CA): PTH: 124 pg/mL — ABNORMAL HIGH (ref 15–65)

## 2022-05-07 NOTE — Progress Notes (Signed)
[facility] Progress Note   Subjective:  Seen in room - dialyzed yesterday with 2.9L off, but BP dropped during HD. Will lower UF goal for next time. No CP/dyspnea in AM. L eye stye improving. Second set of labs on 1/30 are post-HD.  Objective Vitals:   05/06/22 1455 05/06/22 2012 05/07/22 0454 05/07/22 0943  BP: (!) 138/35 121/71 (!) 145/59 (!) 140/65  Pulse: (!) 49 (!) 59 60 (!) 57  Resp:  18 17 18   Temp: 97.7 F (36.5 C) 98.2 F (36.8 C) 98.4 F (36.9 C) 98.2 F (36.8 C)  TempSrc: Oral Oral Oral Oral  SpO2: 93% 100% 98% 100%  Weight:      Height:       Physical Exam General: Well appearing obese woman, NAD. Heart: RRR; no murmur Lungs: CTAB; no wheezing Abdomen: bandages to B lateral pannus Extremities: B AKA; no stump edema Dialysis Access: [facility]  Additional Objective Labs: Basic Metabolic Panel: Recent Labs  Lab 05/02/22 0826 05/06/22 0854 05/06/22 1349  NA 134* 136 133*  K 5.3* 5.7* 3.4*  CL 96* 98 94*  CO2 26 26 26   GLUCOSE 78 81 124*  BUN 31* 67* 21*  CREATININE 6.00* 9.64* 3.96*  CALCIUM 9.7 9.8 8.7*  PHOS  --  6.8* 2.0*   Liver Function Tests: Recent Labs  Lab 05/06/22 0854 05/06/22 1349  ALBUMIN 3.4* 3.4*   CBC: Recent Labs  Lab 05/06/22 0854  WBC 7.2  HGB 8.7*  HCT 30.3*  MCV 95.3  PLT 214   Medications:  sodium thiosulfate 12.5 g in sodium chloride 0.9 % 150 mL Infusion for Calciphylaxis Stopped (05/06/22 1446)   sodium thiosulfate 12.5 g in sodium chloride 0.9 % 150 mL Infusion for Calciphylaxis      apixaban  2.5 mg Oral BID   aspirin EC  81 mg Oral Daily   atorvastatin  10 mg Oral Daily   azithromycin  250 mg Oral Daily   darbepoetin (ARANESP) injection - DIALYSIS  200 mcg Subcutaneous Q Tue-1800   docusate sodium  100 mg Oral BID   famotidine  20 mg Oral Daily   feeding supplement  1 Container Oral TID BM   fentaNYL  1 patch Transdermal Q72H   gabapentin  100 mg Oral QHS   Gerhardt's butt cream   Topical BID    heparin sodium (porcine)  4,200 Units Intracatheter Once   levothyroxine  100 mcg Oral Q0600   midodrine  10 mg Oral Once per day on Tue Thu Sat   pantoprazole  40 mg Oral Daily   pregabalin  50 mg Oral Daily   sevelamer carbonate  4.8 g Oral TID WC   sucroferric oxyhydroxide  500 mg Oral TID WC   tobramycin   Left Eye TID    Dialysis Orders: Not yet a candidate for OP HD.   Assessment/Plan: Debility: Has been unable to tolerate dialysis in recliner and therefore unable to discharge to outpatient dialysis. She has lost significant weight this admit (> 50 lbs). Sacral decub ulcers remain a barrier. She now has new powerchair/cushion and has been sitting for longer periods.  Appreciate efforts from PT team.   ESRD: Continue HD on TTS schedule - for HD tomorrow. 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 No calcium based products or vitamin D. Gets 1/2 dose NaThiosulfate 12.5g q HD - could not tolerate higher dose. HTN /volume: On midodrine 10mg  qHD, with 10mg  prn mid HD w/parameters to  keep SBP >100 d/t becoming symptomatic when it drops lower. Significant weight loss this admit Severe ischial sacral decubitus wound: CT showing chronic osteomyelitis. Limiting her, noted to be slow healing. Goal to tolerate sitting upright - which she is doing with PT.  COPD/Chronic O2 use: Prior O2 at home (5L/min) -> tapered to 2-3L/min here Anemia of ESRD: Hgb 8.3 - on max dose Aranesp 242mcg q Tues. Recent Fe load completed.  Secondary hyperparathyroidism: On two binders. CorrCa elevated. Not on VDRA, last PTH 86 (low) on 03/11/22 -> repeat pending. Avoid all calcium based medications for Vitamin D d/t concern for calciphylaxis.  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 Nutrition - Alb 3.3, on regular diet.  Would really prefer she be on a renal diet but she refuses. Disposition: Difficult situation. Prior to admit, patient required 6 staff  members and 2 hoyer pads for transfer into HD chair. Could not stay on HD for full tx. 2/2 pain of multiple decubitus ulcers and PTAR unable to lift pt into ambulance for transport.SW working on LTC at [employer]. Will need to tolerate 5-6hrs in a chair to tolerate OP dialysis including transport, treatment and cannulation time.  The main issue is her Rt ischial wound. She now has new power chair and cushion. She has been sitting in the chair for longer periods. Up to 4 hours past week.   [name], PA-C 05/07/2022, 10:03 AM  [employer]

## 2022-05-07 NOTE — Progress Notes (Signed)
Occupational Therapy Treatment Patient Details Name: Courtney Gray MRN: [id number] DOB: 1971-04-06 Today's Date: 05/07/2022   History of present illness Pt adm 7/28 with acute pulmonary edema; large sacral ulcer limiting sitting tolerance which makes SNF placement difficult for HD; R flank wound making demo wheelchair painful where R side meets armrest; PMH - Bil AKA, ESRD on HD, HTN, DM, sacral wound, PVD, CHF, Splenic infarct with drain placment, lt 3rd finger amputation, gout.   OT comments  Session focused on LB dressing strategies in prep for ADL transfers. Pt with improving endurance though required Mod A to don underwear in bed. Pt continues to move with Modified Independence within the bed. Reintroduced the idea of bari [facility] AP transfer assessment in next session when daughter returns her laundered clothing.    Recommendations for follow up therapy are one component of a multi-disciplinary discharge planning process, led by the attending physician.  Recommendations may be updated based on patient status, additional functional criteria and insurance authorization.    Follow Up Recommendations  Skilled nursing-short term rehab (<3 hours/day) (though pt declining SNF; would benefit from [id number] follow up if opting to DC home pending ability to obtain housing)     Assistance Recommended at Discharge Intermittent Supervision/Assistance  Patient can return home with the following  A little help with walking and/or transfers;A little help with bathing/dressing/bathroom   Equipment Recommendations  Hospital bed;Other (comment) (power chair w/ tilt in space features)    Recommendations for Other Services      Precautions / Restrictions Precautions Precautions: Fall Precaution Comments: bil AKA, 2L O2 at baseline (has been tolerating mobility in power chair on [employer] fine); when in power chair, MUST perform pressure relief every 30 minutes by tilting back for 10 minutes Restrictions Weight  Bearing Restrictions: Yes RLE Weight Bearing: Non weight bearing LLE Weight Bearing: Non weight bearing       Mobility Bed Mobility Overal bed mobility: Modified Independent Bed Mobility: Rolling Rolling: Modified independent (Device/Increase time)         General bed mobility comments: rolling side to side for bed pad change, long sitting and lateral leans for LB dressing in bed    Transfers                         Balance Overall balance assessment: Needs assistance Sitting-balance support: No upper extremity supported Sitting balance-[name] Scale: Good                                     ADL either performed or assessed with clinical judgement   ADL Overall ADL's : Needs assistance/impaired                 Upper Body Dressing : Set up;Bed level Upper Body Dressing Details (indicate cue type and reason): doffing 2 gowns and redowning 2 gowns long sitting in bed Lower Body Dressing: Moderate assistance;Bed level;Sitting/lateral leans Lower Body Dressing Details (indicate cue type and reason): pt able to don underwear around residual limbs, then instructed OT to pull up underwear over bottom while she leaned forward on bedrails at foot of bed x 2               General ADL Comments: Reintroduced idea of bari [facility] AP transfer in next session if pants are washed and brought back in for next OT session.    Extremity/Trunk Assessment Upper  Extremity Assessment Upper Extremity Assessment: Overall WFL for tasks assessed   Lower Extremity Assessment Lower Extremity Assessment: Defer to PT evaluation        Vision   Vision Assessment?: No apparent visual deficits   Perception     Praxis      Cognition Arousal/Alertness: Awake/alert Behavior During Therapy: WFL for tasks assessed/performed Overall Cognitive Status: Within Functional Limits for tasks assessed                                          Exercises       Shoulder Instructions       General Comments Asked about pt progress on looking into apartments though pt reports getting overwhelmed when subject brought back because its stressful. pt reports daughter only off one day a week to be able to look, reports not having a car to look at units and pt reports unable to help d/t being in here (previously encouraged pt using phone to look up units, write them down and call around)    Pertinent Vitals/ Pain       Pain Assessment Pain Assessment: Faces Faces Pain Scale: Hurts little more Pain Location: L residual limb Pain Descriptors / Indicators: Grimacing, Guarding Pain Intervention(s): Monitored during session  Home Living                                          Prior Functioning/Environment              Frequency  Min 1X/week        Progress Toward Goals  OT Goals(current goals can now be found in the care plan section)  Progress towards OT goals: Progressing toward goals  Acute Rehab OT Goals Patient Stated Goal: find a place to stay OT Goal Formulation: With patient Time For Goal Achievement: 05/16/22 Potential to Achieve Goals: Good  Plan Discharge plan remains appropriate;Frequency remains appropriate    Co-evaluation                 AM-PAC OT "6 Clicks" Daily Activity     Outcome Measure   Help from another person eating meals?: None Help from another person taking care of personal grooming?: A Little Help from another person toileting, which includes using toliet, bedpan, or urinal?: A Lot Help from another person bathing (including washing, rinsing, drying)?: A Lot Help from another person to put on and taking off regular upper body clothing?: A Little Help from another person to put on and taking off regular lower body clothing?: A Lot 6 Click Score: 16    End of Session Equipment Utilized During Treatment: Oxygen  OT Visit Diagnosis: Muscle weakness (generalized)  (M62.81);History of falling (Z91.81) Pain - Right/Left: Left Pain - part of body: Leg   Activity Tolerance Patient tolerated treatment well   Patient Left in bed;with call bell/phone within reach   Nurse Communication Mobility status        Time: 8502-7741 OT Time Calculation (min): 33 min  Charges: OT General Charges $OT Visit: 1 Visit OT Treatments $Self Care/Home Management : 23-37 mins  Malachy Chamber, OTR/L Acute Rehab Services Office: [phone]   Courtney Gray 05/07/2022, 12:17 PM

## 2022-05-07 NOTE — Progress Notes (Signed)
Pt's case discussed with RN CM and PT. Will continue to follow and assist with referral to out-pt HD clinic once disposition is confirmed. Pt continues to do well tolerating sitting in her w/c.   Melven Sartorius Renal Navigator [phone]

## 2022-05-07 NOTE — Progress Notes (Signed)
PROGRESS NOTE    Courtney Gray  QPR:[id number] DOB: 1970-11-01 DOA: 11/01/2021 PCP: Monico Blitz, MD    Brief Narrative:   Courtney Gray is a 52 y.o. female with past medical history significant for ESRD opn HD TTS, chronic diastolic congestive heart failure, PVD s/p bilateral AKA, type 2 diabetes mellitus, morbid obesity, chronic sacral wound, recent hospitalization for bacteremia with hepatic/splenic abscess on long-term IV antibiotics who presented to [facility] ED on 7/28 with shortness of breath in the setting of missed hemodialysis and admitted for hypertensive emergency.  Patient with long length of stay, discharge pending due to ability to obtain outpatient hemodialysis slot that can accommodate patient's mobility constraints secondary to decubitus ulcer wound and bilateral AKA.  Patient will need to be able to sit HD chair outpatient to get outpatient HD set up.  Hospital HD unit working on getting bariatric HD chair to accommodate patient.  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  She has declined antibiotics due to GI issues and will continue with wound care.  Doxycycline was discontinued on 03/07/2022. 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 -- Continue aspirin, statin, Eliquis  Decubitus ulcer, sacral region, stage III, right hip, left flank Pressure Injury 11/12/21 Hip Right Unstageable - Full thickness tissue loss in which the base of the injury is covered by slough (yellow, tan, gray, green or brown) and/or eschar (tan, brown or black) in the wound bed. 90% covered with slough (Active)  11/12/21 0800  Location: Hip  Location Orientation: Right  Staging: Unstageable - Full thickness tissue loss in which the base of the injury is covered by slough (yellow, tan, gray, green or brown) and/or eschar (tan, brown or black) in the wound bed.  Wound Description (Comments): 90% covered with slough  Present on Admission: Yes (per RN)     Pressure Injury 11/24/21 Flank Left;Lower Stage 2 -  Partial thickness loss of dermis presenting as a shallow open injury with a red, pink wound bed without slough. (Active)  11/24/21 0430  Location: Flank  Location Orientation: Left;Lower  Staging: Stage 2 -  Partial thickness loss of dermis presenting as a shallow open injury with a red, pink wound bed without slough.  Wound Description (Comments):   Present on Admission: No  -- Continue local wound care, offloading  Hypothyroidism TSH 3.935 on 06/11/2021. --Levothyroxine 100 mcg p.o. daily  Type 2 diabetes mellitus Hemoglobin A1c 5.3 on 12/19/2021, diet controlled.  Hyperlipidemia -- Atorvastatin 10 mg p.o. daily  Chronic respiratory failure secondary to OHS -- Supplemental oxygen to maintain SpO2 greater than 92%  Constipation -- MiraLAX daily  Stye/blepharitis OS Completed 5-day course of azithromycin. -- Tobramycin eye ointment -- Artificial tears -- Warm compress  Morbid obesity Body mass index is 43 kg/m.  Discussed with patient needs for aggressive lifestyle changes/weight loss as this complicates  all facets of care.  Outpatient follow-up with PCP.     DVT prophylaxis: apixaban (ELIQUIS) tablet 2.5 mg Start: 04/28/22 1100 apixaban (ELIQUIS) tablet 2.5 mg    Code Status: Full Code Family Communication:   Disposition Plan:  Level of care: Med-Surg Status is: Inpatient Remains inpatient appropriate because: Medically stable for discharge once outpatient HD seat obtained    Consultants:  Nephrology Infectious disease - signed off 12/1 IR for HD tunneled catheter exchange Vascular surgery Cardiology Orthopedics hand surgery,  Procedures:  TEE Left middle finger amputation 6/19  Antimicrobials:  Azithromycin 1/27 - 1/31 Vancomycin 8/22, 8/24, 10/31,12/1 Doxycycline 11/29 - 11/30 Cefepime 7/28, 7/29, 7/31, 8/2 Metronidazole 7/28 - 8/4   Subjective: Patient seen examined bedside, resting comfortably.  Nephrology PA present.  Asking for artificial tears and does not like the antibiotic ointment that is being placed in her eye.  No other specific complaints or concerns this morning.  Denies headache, no dizziness, no chest pain, no shortness of breath, no abdominal pain.  No acute events overnight per nursing staff.  Medically stable for discharge once outpatient HD bed available.  Objective: Vitals:   05/06/22 1455 05/06/22 2012 05/07/22 0454 05/07/22 0943  BP: (!) 138/35 121/71 (!) 145/59 (!) 140/65  Pulse: (!) 49 (!) 59 60 (!) 57  Resp:  18 17 18   Temp: 97.7 F (36.5 C) 98.2 F (36.8 C) 98.4 F (36.9 C) 98.2 F (36.8 C)  TempSrc: Oral Oral Oral Oral  SpO2: 93% 100% 98% 100%  Weight:      Height:        Intake/Output Summary (Last 24 hours) at 05/07/2022 1517 Last data filed at 05/07/2022 1300 Gross per 24 hour  Intake 960 ml  Output 0 ml  Net 960 ml   Filed Weights   05/03/22 1809 05/06/22 0900 05/06/22 1321  Weight: 115.1 kg 120.1 kg 117.2 kg    Examination:  Physical Exam: GEN: NAD, alert and oriented x 3, obese HEENT: NCAT, PERRL, EOMI, sclera  clear, MMM, slightly edematous eyelids left eye without erythema PULM: CTAB w/o wheezes/crackles, normal respiratory effort, on 2 L nasal cannula CV: RRR w/o M/G/R GI: abd soft, NTND, NABS, no R/G/M MSK: Noted bilateral AKA, left middle finger amputation, moves upper extremities independently. NEURO: No focal neurological deficits PSYCH: normal mood/affect Integumentary: Sacral wound noted    Data Reviewed: I have personally reviewed following labs and imaging studies  CBC: Recent Labs  Lab 05/06/22 0854  WBC 7.2  HGB 8.7*  HCT 30.3*  MCV 95.3  PLT 094   Basic Metabolic Panel: Recent Labs  Lab 05/02/22 0826 05/06/22 0854 05/06/22 1349  NA 134* 136 133*  K 5.3* 5.7* 3.4*  CL 96* 98 94*  CO2 26 26 26   GLUCOSE 78 81 124*  BUN 31* 67* 21*  CREATININE 6.00* 9.64* 3.96*  CALCIUM 9.7 9.8 8.7*  PHOS  --  6.8* 2.0*   GFR: Estimated Creatinine Clearance: 21.5 mL/min (A) (by C-G formula based on SCr of 3.96 mg/dL (H)). Liver Function Tests: Recent Labs  Lab 05/06/22 0854 05/06/22 1349  ALBUMIN 3.4* 3.4*   No results for input(s): "LIPASE", "AMYLASE" in the last 168 hours. No results for input(s): "AMMONIA" in the last 168 hours. Coagulation Profile: No results for input(s): "INR", "PROTIME" in the last 168 hours. Cardiac Enzymes: No results for input(s): "CKTOTAL", "CKMB", "CKMBINDEX", "TROPONINI" in the last 168 hours. BNP (  last 3 results) No results for input(s): "PROBNP" in the last 8760 hours. HbA1C: No results for input(s): "HGBA1C" in the last 72 hours. CBG: No results for input(s): "GLUCAP" in the last 168 hours. Lipid Profile: No results for input(s): "CHOL", "HDL", "LDLCALC", "TRIG", "CHOLHDL", "LDLDIRECT" in the last 72 hours. Thyroid Function Tests: No results for input(s): "TSH", "T4TOTAL", "FREET4", "T3FREE", "THYROIDAB" in the last 72 hours. Anemia Panel: No results for input(s): "VITAMINB12", "FOLATE", "FERRITIN", "TIBC", "IRON", "RETICCTPCT" in  the last 72 hours. Sepsis Labs: No results for input(s): "PROCALCITON", "LATICACIDVEN" in the last 168 hours.  No results found for this or any previous visit (from the past 240 hour(s)).       Radiology Studies: No results found.      Scheduled Meds:  apixaban  2.5 mg Oral BID   aspirin EC  81 mg Oral Daily   atorvastatin  10 mg Oral Daily   azithromycin  250 mg Oral Daily   darbepoetin (ARANESP) injection - DIALYSIS  200 mcg Subcutaneous Q Tue-1800   docusate sodium  100 mg Oral BID   famotidine  20 mg Oral Daily   feeding supplement  1 Container Oral TID BM   fentaNYL  1 patch Transdermal Q72H   gabapentin  100 mg Oral QHS   Gerhardt's butt cream   Topical BID   heparin sodium (porcine)  4,200 Units Intracatheter Once   levothyroxine  100 mcg Oral Q0600   midodrine  10 mg Oral Once per day on Tue Thu Sat   pantoprazole  40 mg Oral Daily   pregabalin  50 mg Oral Daily   sevelamer carbonate  4.8 g Oral TID WC   sucroferric oxyhydroxide  500 mg Oral TID WC   tobramycin   Left Eye TID   Continuous Infusions:  sodium thiosulfate 12.5 g in sodium chloride 0.9 % 150 mL Infusion for Calciphylaxis Stopped (05/06/22 1446)   sodium thiosulfate 12.5 g in sodium chloride 0.9 % 150 mL Infusion for Calciphylaxis       LOS: 187 days    Time spent: 48 minutes spent on chart review, discussion with nursing staff, consultants, updating family and interview/physical exam; more than 50% of that time was spent in counseling and/or coordination of care.    Tiffannie Sloss J [place] DO [facility] Available via Epic secure chat 7am-7pm After these hours, please refer to coverage provider listed on [email or web address] 05/07/2022, 3:17 PM

## 2022-05-07 NOTE — Progress Notes (Signed)
PT Cancellation Note  Patient Details Name: Courtney Gray MRN: [id number] DOB: 1970/07/27   Cancelled Treatment:    Reason Eval/Treat Not Completed: Other (comment).  Reports her pain was so severe she was up until 2 AM, now tired and declines transfers.  Follow up at another time.   Ramond Dial 05/07/2022, 12:31 PM  Mee Hives, PT PhD Acute Rehab Dept. Number: [place] and [place]

## 2022-05-08 DIAGNOSIS — N186 End stage renal disease: Secondary | ICD-10-CM | POA: Diagnosis not present

## 2022-05-08 DIAGNOSIS — E1169 Type 2 diabetes mellitus with other specified complication: Secondary | ICD-10-CM | POA: Diagnosis not present

## 2022-05-08 DIAGNOSIS — I10 Essential (primary) hypertension: Secondary | ICD-10-CM | POA: Diagnosis not present

## 2022-05-08 DIAGNOSIS — L89153 Pressure ulcer of sacral region, stage 3: Secondary | ICD-10-CM | POA: Diagnosis not present

## 2022-05-08 MED ORDER — LACTULOSE 10 GM/15ML PO SOLN
20.0000 g | Freq: Once | ORAL | Status: AC
  Administered 2022-05-08: 20 g via ORAL
  Filled 2022-05-08: qty 30

## 2022-05-08 MED ORDER — POLYVINYL ALCOHOL 1.4 % OP SOLN
1.0000 [drp] | OPHTHALMIC | Status: DC | PRN
  Administered 2022-05-08: 1 [drp] via OPHTHALMIC
  Filled 2022-05-08: qty 15

## 2022-05-08 MED ORDER — SODIUM THIOSULFATE 250 MG/ML IV SOLN
12.5000 g | Freq: Once | INTRAVENOUS | Status: AC
  Administered 2022-05-09: 12.5 g via INTRAVENOUS
  Filled 2022-05-08: qty 50

## 2022-05-08 NOTE — Progress Notes (Signed)
[facility] Progress Note   Subjective:   Seen in room - she is feeling very constipated this AM and just took a dose of lactulose. It hasn't "kicked in" yet, so she is requesting dialysis to be moved to tomorrow - orders changed. Denies CP/dyspnea.  Objective Vitals:   05/07/22 1603 05/07/22 2053 05/08/22 0601 05/08/22 0932  BP: (!) 111/40 (!) 137/56 (!) 127/59 (!) 127/42  Pulse: (!) 59 62 (!) 102 (!) 57  Resp: 17 18 18 17   Temp: 98.3 F (36.8 C) 98.2 F (36.8 C) 98.1 F (36.7 C) 97.7 F (36.5 C)  TempSrc: Oral Oral Oral Oral  SpO2: 98% 100% 100% 98%  Weight:      Height:       Physical Exam General: Well appearing obese woman, NAD. Heart: RRR; no murmur Lungs: CTAB; no wheezing Abdomen: bandages to B lateral pannus Extremities: B AKA; no stump edema Dialysis Access: [facility]  Additional Objective Labs: Basic Metabolic Panel: Recent Labs  Lab 05/02/22 0826 05/06/22 0854 05/06/22 1349  NA 134* 136 133*  K 5.3* 5.7* 3.4*  CL 96* 98 94*  CO2 26 26 26   GLUCOSE 78 81 124*  BUN 31* 67* 21*  CREATININE 6.00* 9.64* 3.96*  CALCIUM 9.7 9.8 8.7*  PHOS  --  6.8* 2.0*   Liver Function Tests: Recent Labs  Lab 05/06/22 0854 05/06/22 1349  ALBUMIN 3.4* 3.4*   CBC: Recent Labs  Lab 05/06/22 0854  WBC 7.2  HGB 8.7*  HCT 30.3*  MCV 95.3  PLT 214   Blood Culture    Component Value Date/Time   SDES BLOOD LEFT ANTECUBITAL 10/16/2021 1534   SPECREQUEST  10/16/2021 1534    BOTTLES DRAWN AEROBIC AND ANAEROBIC Blood Culture results may not be optimal due to an inadequate volume of blood received in culture bottles   CULT  10/16/2021 1534    NO GROWTH 5 DAYS Performed at [facility], [place] [address], [place], [place] [address]    REPTSTATUS 10/21/2021 FINAL 10/16/2021 1534   Medications:  sodium thiosulfate 12.5 g in sodium chloride 0.9 % 150 mL Infusion for Calciphylaxis Stopped (05/06/22 1446)   sodium thiosulfate 12.5 g in sodium chloride 0.9 %  150 mL Infusion for Calciphylaxis      apixaban  2.5 mg Oral BID   aspirin EC  81 mg Oral Daily   atorvastatin  10 mg Oral Daily   azithromycin  250 mg Oral Daily   darbepoetin (ARANESP) injection - DIALYSIS  200 mcg Subcutaneous Q Tue-1800   docusate sodium  100 mg Oral BID   famotidine  20 mg Oral Daily   feeding supplement  1 Container Oral TID BM   fentaNYL  1 patch Transdermal Q72H   gabapentin  100 mg Oral QHS   Gerhardt's butt cream   Topical BID   heparin sodium (porcine)  4,200 Units Intracatheter Once   levothyroxine  100 mcg Oral Q0600   midodrine  10 mg Oral Once per day on Tue Thu Sat   pantoprazole  40 mg Oral Daily   pregabalin  50 mg Oral Daily   sevelamer carbonate  4.8 g Oral TID WC   sucroferric oxyhydroxide  500 mg Oral TID WC   tobramycin   Left Eye TID    Dialysis Orders: Not yet a candidate for OP HD.   Assessment/Plan: Debility: Has been unable to tolerate dialysis in recliner and therefore unable to discharge to outpatient dialysis. She has lost  significant weight this admit (> 50 lbs). Sacral decub ulcers remain a barrier. She now has new powerchair/cushion and has been sitting for longer periods.  Appreciate efforts from PT team.   ESRD: Continue HD on TTS schedule - declining HD today due to constipation issue, reschedule HD to tomorrow (Fri). 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 No calcium based products or vitamin D. Gets 1/2 dose NaThiosulfate 12.5g q HD - could not tolerate higher dose. HTN /volume: On midodrine 10mg  qHD, with 10mg  prn mid HD w/parameters to keep SBP >100 d/t becoming symptomatic when it drops lower. Significant weight loss this admit Severe ischial sacral decubitus wound: CT showing chronic osteomyelitis. Limiting her, noted to be slow healing. Goal to tolerate sitting upright - which she is doing with PT.  COPD/Chronic O2 use: Prior O2 at home (5L/min) -> tapered to 2-3L/min here Anemia  of ESRD: Hgb 8.7 - on max dose Aranesp 223mcg q Tues. Recent Fe load completed.  Secondary hyperparathyroidism: On two binders. CorrCa elevated. Not on VDRA, last PTH 86 (low) on 03/11/22 -> repeat pending. Avoid all calcium based medications for Vitamin D d/t concern for calciphylaxis.  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 Nutrition - Alb 3.3, on regular diet.  Would really prefer she be on a renal diet but she refuses. Disposition: Difficult situation. Prior to admit, patient required 6 staff members and 2 hoyer pads for transfer into HD chair. Could not stay on HD for full tx. 2/2 pain of multiple decubitus ulcers and PTAR unable to lift pt into ambulance for transport.SW working on LTC at [employer]. Will need to tolerate 5-6hrs in a chair to tolerate OP dialysis including transport, treatment and cannulation time.  The main issue is her Rt ischial wound. She now has new power chair and cushion. She has been sitting in the chair for longer periods. Up to 4 hours past week.   [name], PA-C 05/08/2022, 9:55 AM  [employer]

## 2022-05-08 NOTE — Progress Notes (Signed)
PROGRESS NOTE    Courtney Gray  PJA:[id number] DOB: 10/23/1970 DOA: 11/01/2021 PCP: Monico Blitz, MD    Brief Narrative:   Courtney Gray is a 52 y.o. female with past medical history significant for ESRD opn HD TTS, chronic diastolic congestive heart failure, PVD s/p bilateral AKA, type 2 diabetes mellitus, morbid obesity, chronic sacral wound, recent hospitalization for bacteremia with hepatic/splenic abscess on long-term IV antibiotics who presented to [facility] ED on 7/28 with shortness of breath in the setting of missed hemodialysis and admitted for hypertensive emergency.  Patient with long length of stay, discharge pending due to ability to obtain outpatient hemodialysis slot that can accommodate patient's mobility constraints secondary to decubitus ulcer wound and bilateral AKA.  Patient will need to be able to sit HD chair outpatient to get outpatient HD set up.  Hospital HD unit working on getting bariatric HD chair to accommodate patient.  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  She has declined antibiotics due to GI issues and will continue with wound care.  Doxycycline was discontinued on 03/07/2022. 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 -- Continue aspirin, statin, Eliquis  Decubitus ulcer, sacral region, stage III, right hip, left flank Pressure Injury 11/12/21 Hip Right Unstageable - Full thickness tissue loss in which the base of the injury is covered by slough (yellow, tan, gray, green or brown) and/or eschar (tan, brown or black) in the wound bed. 90% covered with slough (Active)  11/12/21 0800  Location: Hip  Location Orientation: Right  Staging: Unstageable - Full thickness tissue loss in which the base of the injury is covered by slough (yellow, tan, gray, green or brown) and/or eschar (tan, brown or black) in the wound bed.  Wound Description (Comments): 90% covered with slough  Present on Admission: Yes (per RN)     Pressure Injury 11/24/21 Flank Left;Lower Stage 2 -  Partial thickness loss of dermis presenting as a shallow open injury with a red, pink wound bed without slough. (Active)  11/24/21 0430  Location: Flank  Location Orientation: Left;Lower  Staging: Stage 2 -  Partial thickness loss of dermis presenting as a shallow open injury with a red, pink wound bed without slough.  Wound Description (Comments):   Present on Admission: No  -- Continue local wound care, offloading  Hypothyroidism TSH 3.935 on 06/11/2021. --Levothyroxine 100 mcg p.o. daily  Type 2 diabetes mellitus Hemoglobin A1c 5.3 on 12/19/2021, diet controlled.  Hyperlipidemia -- Atorvastatin 10 mg p.o. daily  Chronic respiratory failure secondary to OHS -- Supplemental oxygen to maintain SpO2 greater than 92%  Constipation -- MiraLAX daily  Stye/blepharitis OS Completed 5-day course of azithromycin.  To complete 10-day course of tobramycin today. -- Artificial tears PRN -- Warm compress  Morbid obesity  Body mass index is 43 kg/m.  Discussed with patient needs for aggressive lifestyle changes/weight  loss as this complicates all facets of care.  Outpatient follow-up with PCP.     DVT prophylaxis: apixaban (ELIQUIS) tablet 2.5 mg Start: 04/28/22 1100 apixaban (ELIQUIS) tablet 2.5 mg    Code Status: Full Code Family Communication: No family present at bedside this morning  Disposition Plan:  Level of care: Med-Surg Status is: Inpatient Remains inpatient appropriate because: Medically stable for discharge once outpatient HD seat obtained    Consultants:  Nephrology Infectious disease - signed off 12/1 IR for HD tunneled catheter exchange Vascular surgery Cardiology Orthopedics hand surgery,  Procedures:  TEE Left middle finger amputation 6/19  Antimicrobials:  Azithromycin 1/27 - 1/31 Vancomycin 8/22, 8/24, 10/31,12/1 Doxycycline 11/29 - 11/30 Cefepime 7/28, 7/29, 7/31, 8/2 Metronidazole 7/28 - 8/4   Subjective: Patient seen examined bedside, resting comfortably.  Sitting up in bed.  Refused dialysis today as she feels constipated.  Requesting lactulose.  Requesting change of artificial tears from an ointment to a drop.  RN present at bedside. No other specific complaints or concerns this morning.  Denies headache, no dizziness, no chest pain, no shortness of breath, no abdominal pain.  No acute events overnight per nursing staff.  Medically stable for discharge once outpatient HD bed available.  Objective: Vitals:   05/07/22 1603 05/07/22 2053 05/08/22 0601 05/08/22 0932  BP: (!) 111/40 (!) 137/56 (!) 127/59 (!) 127/42  Pulse: (!) 59 62 (!) 102 (!) 57  Resp: 17 18 18 17   Temp: 98.3 F (36.8 C) 98.2 F (36.8 C) 98.1 F (36.7 C) 97.7 F (36.5 C)  TempSrc: Oral Oral Oral Oral  SpO2: 98% 100% 100% 98%  Weight:      Height:        Intake/Output Summary (Last 24 hours) at 05/08/2022 1142 Last data filed at 05/08/2022 0900 Gross per 24 hour  Intake 994.13 ml  Output 0 ml  Net 994.13 ml   Filed Weights   05/03/22 1809 05/06/22 0900 05/06/22 1321  Weight: 115.1 kg  120.1 kg 117.2 kg    Examination:  Physical Exam: GEN: NAD, alert and oriented x 3, obese HEENT: NCAT, PERRL, EOMI, sclera clear, MMM, slightly edematous eyelids left eye without erythema PULM: CTAB w/o wheezes/crackles, normal respiratory effort, on 2 L nasal cannula CV: RRR w/o M/G/R GI: abd soft, NTND, NABS, no R/G/M MSK: Noted bilateral AKA, left middle finger amputation, moves upper extremities independently. NEURO: No focal neurological deficits PSYCH: normal mood/affect Integumentary: Sacral wound noted    Data Reviewed: I have personally reviewed following labs and imaging studies  CBC: Recent Labs  Lab 05/06/22 0854  WBC 7.2  HGB 8.7*  HCT 30.3*  MCV 95.3  PLT 660   Basic Metabolic Panel: Recent Labs  Lab 05/02/22 0826 05/06/22 0854 05/06/22 1349  NA 134* 136 133*  K 5.3* 5.7* 3.4*  CL 96* 98 94*  CO2 26 26 26   GLUCOSE 78 81 124*  BUN 31* 67* 21*  CREATININE 6.00* 9.64* 3.96*  CALCIUM 9.7 9.8 8.7*  PHOS  --  6.8* 2.0*   GFR: Estimated Creatinine Clearance: 21.5 mL/min (A) (by C-G formula based on SCr of 3.96 mg/dL (H)). Liver Function Tests: Recent Labs  Lab 05/06/22 0854 05/06/22 1349  ALBUMIN 3.4* 3.4*   No results for input(s): "LIPASE", "AMYLASE" in the last 168 hours. No results for input(s): "AMMONIA" in the last 168 hours. Coagulation Profile: No results for input(s): "  INR", "PROTIME" in the last 168 hours. Cardiac Enzymes: No results for input(s): "CKTOTAL", "CKMB", "CKMBINDEX", "TROPONINI" in the last 168 hours. BNP (last 3 results) No results for input(s): "PROBNP" in the last 8760 hours. HbA1C: No results for input(s): "HGBA1C" in the last 72 hours. CBG: No results for input(s): "GLUCAP" in the last 168 hours. Lipid Profile: No results for input(s): "CHOL", "HDL", "LDLCALC", "TRIG", "CHOLHDL", "LDLDIRECT" in the last 72 hours. Thyroid Function Tests: No results for input(s): "TSH", "T4TOTAL", "FREET4", "T3FREE", "THYROIDAB" in  the last 72 hours. Anemia Panel: No results for input(s): "VITAMINB12", "FOLATE", "FERRITIN", "TIBC", "IRON", "RETICCTPCT" in the last 72 hours. Sepsis Labs: No results for input(s): "PROCALCITON", "LATICACIDVEN" in the last 168 hours.  No results found for this or any previous visit (from the past 240 hour(s)).       Radiology Studies: No results found.      Scheduled Meds:  apixaban  2.5 mg Oral BID   aspirin EC  81 mg Oral Daily   atorvastatin  10 mg Oral Daily   darbepoetin (ARANESP) injection - DIALYSIS  200 mcg Subcutaneous Q Tue-1800   docusate sodium  100 mg Oral BID   famotidine  20 mg Oral Daily   feeding supplement  1 Container Oral TID BM   fentaNYL  1 patch Transdermal Q72H   gabapentin  100 mg Oral QHS   Gerhardt's butt cream   Topical BID   heparin sodium (porcine)  4,200 Units Intracatheter Once   levothyroxine  100 mcg Oral Q0600   midodrine  10 mg Oral Once per day on Tue Thu Sat   pantoprazole  40 mg Oral Daily   pregabalin  50 mg Oral Daily   sevelamer carbonate  4.8 g Oral TID WC   sucroferric oxyhydroxide  500 mg Oral TID WC   Continuous Infusions:  sodium thiosulfate 12.5 g in sodium chloride 0.9 % 150 mL Infusion for Calciphylaxis Stopped (05/06/22 1446)   [START ON 05/09/2022] sodium thiosulfate 12.5 g in sodium chloride 0.9 % 150 mL Infusion for Calciphylaxis       LOS: 188 days    Time spent: 48 minutes spent on chart review, discussion with nursing staff, consultants, updating family and interview/physical exam; more than 50% of that time was spent in counseling and/or coordination of care.    Danaly Bari J [place] DO [facility] Available via Epic secure chat 7am-7pm After these hours, please refer to coverage provider listed on [email or web address] 05/08/2022, 11:42 AM

## 2022-05-08 NOTE — Progress Notes (Signed)
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    PT Comments    Participated in education for donning and adjusting shrinker socks for bil AKA. Waist band not utilized for comfort due to wounds at this time. Lt residual limb with some difficulty fitting due to excess soft tissue distal to bone, suspect this will shape well with use of shrinker and become easier for pt to donne. Will work on independence with these. Declines OOB today due to constipation and pain from recent bandage changes. Discussed dispo planning further. Considering SNF with possible loaner pwr w/c if she can participate fully to achieve Mod I level prior to d/c SNF will likely qualify for her own power w/c.  Still needs to arrange housing. Encouraged further independence with bed level exercises between therapy sessions. Patient will continue to benefit from skilled physical therapy services to further improve independence with functional mobility.   Recommendations for follow up therapy are one component of a multi-disciplinary discharge planning process, led by the attending physician.  Recommendations may be updated based on patient status, additional functional criteria and insurance authorization.  Follow Up Recommendations  Home health PT (consider PACE services vs SNF pending housing) Can patient physically be transported by private vehicle: No   Assistance Recommended at Discharge Intermittent Supervision/Assistance  Patient can return home with the following A little help with walking and/or  transfers;Help with stairs or ramp for entrance;Assist for transportation (medical [name] Lei transport)   Financial risk analyst (measurements PT);Wheelchair cushion (measurements PT);Other (comment);Hospital bed (more accomodating mechanical lift, with sling that handles pt's with bil AKAs;  cushion (20x24), power wheelchair with power tilt and recline)    Recommendations for Other Services Rehab consult     Precautions / Restrictions Precautions Precautions: Fall Precaution Comments: bil AKA, 2L O2 at baseline (has been tolerating mobility in power chair on [employer] fine); when in power chair, MUST perform pressure relief every 30 minutes by tilting back for 10 minutes Restrictions Weight Bearing Restrictions: No Other Position/Activity Restrictions: when in power chair, MUST perform pressure relief every 30 minutes by tilting back for 10 minutes     Mobility  Bed Mobility Overal bed mobility: Modified Independent             General bed mobility comments: Moves in bed without assist.    Transfers                        Ambulation/Gait               General Gait Details: unable   Stairs             Wheelchair Mobility    Modified Rankin (Stroke Patients Only)       Balance Overall balance assessment: Needs assistance Sitting-balance support: No upper extremity supported Sitting balance-[name] Scale: Good Sitting balance - Comments: Able to sit unsupported in bed and powerchair without difficulties  Cognition Arousal/Alertness: Awake/alert Behavior During Therapy: WFL for tasks assessed/performed Overall Cognitive Status: Within Functional Limits for tasks assessed                                          Exercises      General Comments General comments (skin integrity, edema, etc.): Educated on shrinker sock use and progression. To wear in 2 hour intervals  initially and incrase duration with frequent checks to ensure no rolling is occurring to press on skin. Adjusted several times for fit - waist band removed due to wounds for now but will help from shrinker rolling down in the future. Pt needs assist to donne this date, will have teach back in future visits. Will avoid sleeping in them for now.      Pertinent Vitals/Pain Pain Assessment Pain Assessment: Faces Faces Pain Scale: Hurts even more Pain Location: wounds Pain Descriptors / Indicators: Grimacing, Guarding Pain Intervention(s): Monitored during session, Repositioned    Home Living                          Prior Function            PT Goals (current goals can now be found in the care plan section) Acute Rehab PT Goals Patient Stated Goal: Reports wants to be up in power chair and be more independent PT Goal Formulation: With patient Time For Goal Achievement: 05/15/22 Potential to Achieve Goals: Good Progress towards PT goals: Progressing toward goals    Frequency    Min 3X/week      PT Plan Current plan remains appropriate (pt and her daughter must work on finding accessible housing)    Co-evaluation              AM-PAC PT "6 Clicks" Mobility   Outcome Measure  Help needed turning from your back to your side while in a flat bed without using bedrails?: None Help needed moving from lying on your back to sitting on the side of a flat bed without using bedrails?: None Help needed moving to and from a bed to a chair (including a wheelchair)?: A Little Help needed standing up from a chair using your arms (e.g., wheelchair or bedside chair)?: Total Help needed to walk in hospital room?: Total Help needed climbing 3-5 steps with a railing? : Total 6 Click Score: 14    End of Session Equipment Utilized During Treatment: Other (comment) (Shrinker socks) Activity Tolerance: Patient limited by pain Patient left: in bed;with call bell/phone within  reach   PT Visit Diagnosis: Other abnormalities of gait and mobility (R26.89);Muscle weakness (generalized) (M62.81) Pain - Right/Left: Right Pain - part of body:  (R abdomen)     Time: 7416-3845 PT Time Calculation (min) (ACUTE ONLY): 25 min  Charges:  $Therapeutic Activity: 8-22 mins $Self Care/Home Management: [place], PT, DPT Physical Therapist Acute Rehabilitation Services [place]    Ellouise Newer 05/08/2022, 4:17 PM

## 2022-05-09 DIAGNOSIS — L89153 Pressure ulcer of sacral region, stage 3: Secondary | ICD-10-CM | POA: Diagnosis not present

## 2022-05-09 DIAGNOSIS — E1169 Type 2 diabetes mellitus with other specified complication: Secondary | ICD-10-CM | POA: Diagnosis not present

## 2022-05-09 DIAGNOSIS — I10 Essential (primary) hypertension: Secondary | ICD-10-CM | POA: Diagnosis not present

## 2022-05-09 DIAGNOSIS — N186 End stage renal disease: Secondary | ICD-10-CM | POA: Diagnosis not present

## 2022-05-09 LAB — RENAL FUNCTION PANEL
Albumin: 3.2 g/dL — ABNORMAL LOW (ref 3.5–5.0)
Anion gap: 12 (ref 5–15)
BUN: 76 mg/dL — ABNORMAL HIGH (ref 6–20)
CO2: 26 mmol/L (ref 22–32)
Calcium: 9.5 mg/dL (ref 8.9–10.3)
Chloride: 100 mmol/L (ref 98–111)
Creatinine, Ser: 10.65 mg/dL — ABNORMAL HIGH (ref 0.44–1.00)
GFR, Estimated: 4 mL/min — ABNORMAL LOW (ref >60)
Glucose, Bld: 87 mg/dL (ref 70–99)
Phosphorus: 6.3 mg/dL — ABNORMAL HIGH (ref 2.5–4.6)
Potassium: 6.6 mmol/L (ref 3.5–5.1)
Sodium: 138 mmol/L (ref 135–145)

## 2022-05-09 LAB — CBC
HCT: 29 % — ABNORMAL LOW (ref 36.0–46.0)
Hemoglobin: 8.7 g/dL — ABNORMAL LOW (ref 12.0–15.0)
MCH: 27.9 pg (ref 26.0–34.0)
MCHC: 30 g/dL (ref 30.0–36.0)
MCV: 92.9 fL (ref 80.0–100.0)
Platelets: 223 10*3/uL (ref 150–400)
RBC: 3.12 MIL/uL — ABNORMAL LOW (ref 3.87–5.11)
RDW: 16.7 % — ABNORMAL HIGH (ref 11.5–15.5)
WBC: 8.1 10*3/uL (ref 4.0–10.5)
nRBC: 0 % (ref 0.0–0.2)

## 2022-05-09 MED ORDER — HEPARIN SODIUM (PORCINE) 1000 UNIT/ML DIALYSIS
2000.0000 [IU] | Freq: Once | INTRAMUSCULAR | Status: AC
  Administered 2022-05-09: 2000 [IU] via INTRAVENOUS_CENTRAL

## 2022-05-09 MED ORDER — HEPARIN SODIUM (PORCINE) 1000 UNIT/ML IJ SOLN
INTRAMUSCULAR | Status: AC
  Filled 2022-05-09: qty 2

## 2022-05-09 MED ORDER — HEPARIN SODIUM (PORCINE) 1000 UNIT/ML IJ SOLN
INTRAMUSCULAR | Status: AC
  Filled 2022-05-09: qty 5

## 2022-05-09 NOTE — Progress Notes (Signed)
PROGRESS NOTE    Courtney Gray  PIR:[id number] DOB: 05-23-1970 DOA: 11/01/2021 PCP: Monico Blitz, MD    Brief Narrative:   Courtney Gray is a 52 y.o. female with past medical history significant for ESRD opn HD TTS, chronic diastolic congestive heart failure, PVD s/p bilateral AKA, type 2 diabetes mellitus, morbid obesity, chronic sacral wound, recent hospitalization for bacteremia with hepatic/splenic abscess on long-term IV antibiotics who presented to [facility] ED on 7/28 with shortness of breath in the setting of missed hemodialysis and admitted for hypertensive emergency.  Patient with long length of stay, discharge pending due to ability to obtain outpatient hemodialysis slot that can accommodate patient's mobility constraints secondary to decubitus ulcer wound and bilateral AKA.  Patient will need to be able to sit HD chair outpatient to get outpatient HD set up.  Hospital HD unit working on getting bariatric HD chair to accommodate patient.  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  She has declined antibiotics due to GI issues and will continue with wound care.  Doxycycline was discontinued on 03/07/2022. --Continue local wound care, offloading -- Fentanyl patch, oxycodone as needed  Calciphylaxis/abdominal wound: -- Sodium thiosulfate per nephrology -- Continue wound care, oxycodone as needed  Hypotension during HD -- Continue midodrine on HD days  Phantom limb pain Peripheral neuropathy -- Lyrica, gabapentin  Hyperkalemia --  Electrolyte management with HD  Peripheral vascular disease/peripheral arterial disease Follows with vascular surgery outpatient, s/p bilateral AKA and amputation left middle finger. -- Continue aspirin, statin, Eliquis  Decubitus ulcer, sacral region, stage III, right hip, left flank Pressure Injury 11/12/21 Hip Right Unstageable - Full thickness tissue loss in which the base of the injury is covered by slough (yellow, tan, gray, green or brown) and/or eschar (tan, brown or black) in the wound bed. 90% covered with slough (Active)  11/12/21 0800  Location: Hip  Location Orientation: Right  Staging: Unstageable - Full thickness tissue loss in which the base of the injury is covered by slough (yellow, tan, gray, green or brown) and/or eschar (tan, brown or black) in the wound bed.  Wound Description (Comments): 90% covered with slough  Present on Admission: Yes (per RN)     Pressure Injury 11/24/21 Flank Left;Lower Stage 2 -  Partial thickness loss of dermis presenting as a shallow open injury with a red, pink wound bed without slough. (Active)  11/24/21 0430  Location: Flank  Location Orientation: Left;Lower  Staging: Stage 2 -  Partial thickness loss of dermis presenting as a shallow open injury with a red, pink wound bed without slough.  Wound Description (Comments):   Present on Admission: No  -- Continue local wound care, offloading  Hypothyroidism TSH 3.935 on 06/11/2021. --Levothyroxine 100 mcg p.o. daily  Type 2 diabetes mellitus Hemoglobin A1c 5.3 on 12/19/2021, diet controlled.  Hyperlipidemia -- Atorvastatin 10 mg p.o. daily  Chronic respiratory failure secondary to OHS -- Supplemental oxygen to maintain SpO2 greater than 92%  Constipation -- MiraLAX daily  Stye/blepharitis OS Completed 5-day course of azithromycin and 10-day course of tobramycin. -- Artificial tears PRN  Morbid obesity Body mass index is 43.77 kg/m.  Discussed with patient needs for aggressive  lifestyle changes/weight loss as this complicates all facets of care.  Outpatient follow-up with PCP.     DVT prophylaxis: apixaban (ELIQUIS) tablet 2.5 mg Start: 04/28/22 1100 apixaban (ELIQUIS) tablet 2.5 mg    Code Status: Full Code Family Communication: No family present at bedside this morning  Disposition Plan:  Level of care: Med-Surg Status is: Inpatient Remains inpatient appropriate because: Medically stable for discharge once outpatient HD seat obtained and SNF bed    Consultants:  Nephrology Infectious disease - signed off 12/1 IR for HD tunneled catheter exchange Vascular surgery Cardiology Orthopedics hand surgery,  Procedures:  TEE Left middle finger amputation 6/19  Antimicrobials:  Azithromycin 1/27 - 1/31 Vancomycin 8/22, 8/24, 10/31,12/1 Doxycycline 11/29 - 11/30 Cefepime 7/28, 7/29, 7/31, 8/2 Metronidazole 7/28 - 8/4   Subjective: Patient seen examined bedside, resting comfortably.  Sitting up in bed.  Patient reports bowel movement yesterday, planned off scheduled dialysis today.  I feels much better.  No other specific questions or concerns at this time.  Discussed with TOC, working on solution for her mobility chair in order for her to go to outpatient dialysis once that is set up will work on SNF placement, hopefully sometime next week. No other specific complaints or concerns this morning.  Denies headache, no dizziness, no chest pain, no shortness of breath, no abdominal pain.  No acute events overnight per nursing staff.  Medically stable for discharge once outpatient HD bed available.  Objective: Vitals:   05/08/22 1700 05/08/22 2124 05/09/22 0618 05/09/22 0832  BP: (!) 118/44 (!) 121/58 (!) 173/75 (!) 128/48  Pulse: (!) 52 67 64 63  Resp: 16 18 19 18   Temp: 97.9 F (36.6 C) 98.5 F (36.9 C) 98 F (36.7 C) (!) 97.5 F (36.4 C)  TempSrc:  Oral Oral Oral  SpO2: 99% 100% 98% 100%  Weight:   119.3 kg   Height:        Intake/Output Summary  (Last 24 hours) at 05/09/2022 1114 Last data filed at 05/09/2022 1031 Gross per 24 hour  Intake 1080 ml  Output 0 ml  Net 1080 ml   Filed Weights   05/06/22 0900 05/06/22 1321 05/09/22 0618  Weight: 120.1 kg 117.2 kg 119.3 kg    Examination:  Physical Exam: GEN: NAD, alert and oriented x 3, obese HEENT: NCAT, PERRL, EOMI, sclera clear, MMM, slightly edematous eyelids left eye without erythema PULM: CTAB w/o wheezes/crackles, normal respiratory effort, on 2 L nasal cannula CV: RRR w/o M/G/R GI: abd soft, NTND, NABS, no R/G/M MSK: Noted bilateral AKA, left middle finger amputation, moves upper extremities independently. NEURO: No focal neurological deficits PSYCH: normal mood/affect Integumentary: Sacral wound noted    Data Reviewed: I have personally reviewed following labs and imaging studies  CBC: Recent Labs  Lab 05/06/22 0854  WBC 7.2  HGB 8.7*  HCT 30.3*  MCV 95.3  PLT 283   Basic Metabolic Panel: Recent Labs  Lab 05/06/22 0854 05/06/22 1349  NA 136 133*  K 5.7* 3.4*  CL 98 94*  CO2 26 26  GLUCOSE 81 124*  BUN 67* 21*  CREATININE 9.64* 3.96*  CALCIUM 9.8 8.7*  PHOS 6.8* 2.0*   GFR: Estimated Creatinine Clearance: 21.7 mL/min (A) (by C-G formula based on SCr of 3.96 mg/dL (H)). Liver Function Tests: Recent Labs  Lab 05/06/22 0854 05/06/22 1349  ALBUMIN 3.4* 3.4*   No results for  input(s): "LIPASE", "AMYLASE" in the last 168 hours. No results for input(s): "AMMONIA" in the last 168 hours. Coagulation Profile: No results for input(s): "INR", "PROTIME" in the last 168 hours. Cardiac Enzymes: No results for input(s): "CKTOTAL", "CKMB", "CKMBINDEX", "TROPONINI" in the last 168 hours. BNP (last 3 results) No results for input(s): "PROBNP" in the last 8760 hours. HbA1C: No results for input(s): "HGBA1C" in the last 72 hours. CBG: No results for input(s): "GLUCAP" in the last 168 hours. Lipid Profile: No results for input(s): "CHOL", "HDL",  "LDLCALC", "TRIG", "CHOLHDL", "LDLDIRECT" in the last 72 hours. Thyroid Function Tests: No results for input(s): "TSH", "T4TOTAL", "FREET4", "T3FREE", "THYROIDAB" in the last 72 hours. Anemia Panel: No results for input(s): "VITAMINB12", "FOLATE", "FERRITIN", "TIBC", "IRON", "RETICCTPCT" in the last 72 hours. Sepsis Labs: No results for input(s): "PROCALCITON", "LATICACIDVEN" in the last 168 hours.  No results found for this or any previous visit (from the past 240 hour(s)).       Radiology Studies: No results found.      Scheduled Meds:  apixaban  2.5 mg Oral BID   aspirin EC  81 mg Oral Daily   atorvastatin  10 mg Oral Daily   darbepoetin (ARANESP) injection - DIALYSIS  200 mcg Subcutaneous Q Tue-1800   docusate sodium  100 mg Oral BID   famotidine  20 mg Oral Daily   feeding supplement  1 Container Oral TID BM   fentaNYL  1 patch Transdermal Q72H   gabapentin  100 mg Oral QHS   Gerhardt's butt cream   Topical BID   heparin sodium (porcine)  4,200 Units Intracatheter Once   levothyroxine  100 mcg Oral Q0600   midodrine  10 mg Oral Once per day on Tue Thu Sat   pantoprazole  40 mg Oral Daily   pregabalin  50 mg Oral Daily   sevelamer carbonate  4.8 g Oral TID WC   sucroferric oxyhydroxide  500 mg Oral TID WC   Continuous Infusions:  sodium thiosulfate 12.5 g in sodium chloride 0.9 % 150 mL Infusion for Calciphylaxis Stopped (05/06/22 1446)   sodium thiosulfate 12.5 g in sodium chloride 0.9 % 150 mL Infusion for Calciphylaxis       LOS: 189 days    Time spent: 48 minutes spent on chart review, discussion with nursing staff, consultants, updating family and interview/physical exam; more than 50% of that time was spent in counseling and/or coordination of care.    Avonda Toso J [place] DO [facility] Available via Epic secure chat 7am-7pm After these hours, please refer to coverage provider listed on [email or web address] 05/09/2022, 11:14 AM

## 2022-05-09 NOTE — Progress Notes (Signed)
Subjective: Constipation has resolved and awaiting dialysis today  Objective Vital signs in last 24 hours: Vitals:   05/08/22 1700 05/08/22 2124 05/09/22 0618 05/09/22 0832  BP: (!) 118/44 (!) 121/58 (!) 173/75 (!) 128/48  Pulse: (!) 52 67 64 63  Resp: 16 18 19 18   Temp: 97.9 F (36.6 C) 98.5 F (36.9 C) 98 F (36.7 C) (!) 97.5 F (36.4 C)  TempSrc:  Oral Oral Oral  SpO2: 99% 100% 98% 100%  Weight:   119.3 kg   Height:       Weight change:   Physical Exam: General: Obese, ill-appearing female NAD chronic nasal cannula oxygen Heart: RRR no MRG Lungs: CTA nonlabored breathing this cannula oxygen chronic Abdomen: Obese, NABS, soft bandages to bilateral lateral pannus wounds dry clean Extremities: Bilateral AKA no stump edema Dialysis Access: TDC,   OP dialysis orders= prior [facility].// not yet candidate for OP HD  Problem/Plan: Debility: Has been unable to tolerate dialysis in recliner and therefore unable to discharge to outpatient dialysis. She has lost significant weight this admit (> 50 lbs). Sacral decub ulcers remain a barrier. She now has new powerchair/cushion and has been sitting for longer periods.  Appreciate efforts from PT team.   ESRD: Continue HD on now on MWF  schedule - declining HD today Thursday due to constipation issue, rescheduled HD today (Fri). 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 No calcium based products or vitamin D. Gets 1/2 dose NaThiosulfate 12.5g q HD - could not tolerate higher dose. HTN /volume: On midodrine 10mg  qHD, with 10mg  prn mid HD w/parameters to keep SBP >100 d/t becoming symptomatic when it drops lower. Significant weight loss this admit Severe ischial sacral decubitus wound: CT showing chronic osteomyelitis. Limiting her, noted to be slow healing. Goal to tolerate sitting upright - which she is doing with PT.  COPD/Chronic O2 use: Prior O2 at home (5L/min) -> tapered to 2-3L/min here Anemia  of ESRD: Hgb 8.7  - on max dose Aranesp 260mcg q Tues. Recent Fe load completed.,  Follow-up lab trend Secondary hyperparathyroidism: On two binders. CorrCa elevated. Not on VDRA, last PTH 86 (low) on 03/11/22 -> repeat PTH 124 (05/06/22 ) avoid all calcium based medications for Vitamin D d/t concern for calciphylaxis.  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 Nutrition - Alb 3.4, on regular diet.  Would really prefer she be on a renal diet but she refuses. Disposition: Difficult situation. Prior to admit, patient required 6 staff members and 2 hoyer pads for transfer into HD chair. Could not stay on HD for full tx. 2/2 pain of multiple decubitus ulcers and PTAR unable to lift pt into ambulance for transport.SW working on LTC at [employer]. Will need to tolerate 5-6hrs in a chair to tolerate OP dialysis including transport, treatment and cannulation time.  The main issue is her Rt ischial wound. She now has new power chair and cushion. She has been sitting in the chair for longer periods. Up to 4 hours past week.   Ernest Haber, PA-C [facility] [phone] 05/09/2022,9:16 AM  LOS: 189 days   Labs: Basic Metabolic Panel: Recent Labs  Lab 05/06/22 0854 05/06/22 1349  NA 136 133*  K 5.7* 3.4*  CL 98 94*  CO2 26 26  GLUCOSE 81 124*  BUN 67* 21*  CREATININE 9.64* 3.96*  CALCIUM 9.8 8.7*  PHOS 6.8* 2.0*   Liver Function Tests: Recent Labs  Lab  05/06/22 0854 05/06/22 1349  ALBUMIN 3.4* 3.4*   No results for input(s): "LIPASE", "AMYLASE" in the last 168 hours. No results for input(s): "AMMONIA" in the last 168 hours. CBC: Recent Labs  Lab 05/06/22 0854  WBC 7.2  HGB 8.7*  HCT 30.3*  MCV 95.3  PLT 214   Cardiac Enzymes: No results for input(s): "CKTOTAL", "CKMB", "CKMBINDEX", "TROPONINI" in the last 168 hours. CBG: No results for input(s): "GLUCAP" in the last 168 hours.  Studies/Results: No results found. Medications:  sodium  thiosulfate 12.5 g in sodium chloride 0.9 % 150 mL Infusion for Calciphylaxis Stopped (05/06/22 1446)   sodium thiosulfate 12.5 g in sodium chloride 0.9 % 150 mL Infusion for Calciphylaxis      apixaban  2.5 mg Oral BID   aspirin EC  81 mg Oral Daily   atorvastatin  10 mg Oral Daily   darbepoetin (ARANESP) injection - DIALYSIS  200 mcg Subcutaneous Q Tue-1800   docusate sodium  100 mg Oral BID   famotidine  20 mg Oral Daily   feeding supplement  1 Container Oral TID BM   fentaNYL  1 patch Transdermal Q72H   gabapentin  100 mg Oral QHS   Gerhardt's butt cream   Topical BID   heparin sodium (porcine)  4,200 Units Intracatheter Once   levothyroxine  100 mcg Oral Q0600   midodrine  10 mg Oral Once per day on Tue Thu Sat   pantoprazole  40 mg Oral Daily   pregabalin  50 mg Oral Daily   sevelamer carbonate  4.8 g Oral TID WC   sucroferric oxyhydroxide  500 mg Oral TID WC

## 2022-05-09 NOTE — Progress Notes (Signed)
Latest Reference Range & Units 05/09/22 15:26  Potassium 3.5 - 5.1 mmol/L 6.6 (HH)  (HH): Data is critically high  Notified Ernest Haber, nephrology PA-C. Verbal order to change from 2K bath to 1K for 66mins. Patient currently in HD, alert and oriented x4, VSS. SB on monitor.

## 2022-05-10 DIAGNOSIS — J9611 Chronic respiratory failure with hypoxia: Secondary | ICD-10-CM | POA: Diagnosis not present

## 2022-05-10 DIAGNOSIS — N186 End stage renal disease: Secondary | ICD-10-CM | POA: Diagnosis not present

## 2022-05-10 DIAGNOSIS — E1169 Type 2 diabetes mellitus with other specified complication: Secondary | ICD-10-CM | POA: Diagnosis not present

## 2022-05-10 DIAGNOSIS — I10 Essential (primary) hypertension: Secondary | ICD-10-CM | POA: Diagnosis not present

## 2022-05-10 NOTE — Plan of Care (Signed)
°  Problem: Clinical Measurements: °Goal: Will remain free from infection °Outcome: Completed/Met °Goal: Diagnostic test results will improve °Outcome: Completed/Met °Goal: Respiratory complications will improve °Outcome: Completed/Met °Goal: Cardiovascular complication will be avoided °Outcome: Completed/Met °  °

## 2022-05-10 NOTE — Progress Notes (Signed)
Called by patient to her room. She noted her abdominal dressing is bleeding. Dried blood noted from foam dressing. Impregnated dressing was reinforced some bleeding noted. New dressing applied to cover wound. Will continue to monitor. Explained to patient about keeping the pressure and not to touch the dressing.

## 2022-05-10 NOTE — Progress Notes (Signed)
PROGRESS NOTE    Breindy Meadow  OMV:[id number] DOB: 01-Jun-1970 DOA: 11/01/2021 PCP: Monico Blitz, MD    Brief Narrative:   [place] Scheiderer is a 52 y.o. female with past medical history significant for ESRD opn HD TTS, chronic diastolic congestive heart failure, PVD s/p bilateral AKA, type 2 diabetes mellitus, morbid obesity, chronic sacral wound, recent hospitalization for bacteremia with hepatic/splenic abscess on long-term IV antibiotics who presented to [facility] ED on 7/28 with shortness of breath in the setting of missed hemodialysis and admitted for hypertensive emergency.  Patient with long length of stay, discharge pending due to ability to obtain outpatient hemodialysis slot that can accommodate patient's mobility constraints secondary to decubitus ulcer wound and bilateral AKA.  Patient will need to be able to sit HD chair outpatient to get outpatient HD set up.  Hospital HD unit working on getting bariatric HD chair to accommodate patient.  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  She has declined antibiotics due to GI issues and will continue with wound care.  Doxycycline was discontinued on 03/07/2022. 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 -- Continue aspirin, statin, Eliquis  Decubitus ulcer, sacral region, stage III, right hip, left flank Pressure Injury 11/12/21 Hip Right Unstageable - Full thickness tissue loss in which the base of the injury is covered by slough (yellow, tan, gray, green or brown) and/or eschar (tan, brown or black) in the wound bed. 90% covered with slough (Active)  11/12/21 0800  Location: Hip  Location Orientation: Right  Staging: Unstageable - Full thickness tissue loss in which the base of the injury is covered by slough (yellow, tan, gray, green or brown) and/or eschar (tan, brown or black) in the wound bed.  Wound Description (Comments): 90% covered with slough  Present on Admission: Yes (per RN)     Pressure Injury 11/24/21 Flank Left;Lower Stage 2 -  Partial thickness loss of dermis presenting as a shallow open injury with a red, pink wound bed without slough. (Active)  11/24/21 0430  Location: Flank  Location Orientation: Left;Lower  Staging: Stage 2 -  Partial thickness loss of dermis presenting as a shallow open injury with a red, pink wound bed without slough.  Wound Description (Comments):   Present on Admission: No  -- Continue local wound care, offloading  Hypothyroidism TSH 3.935 on 06/11/2021. --Levothyroxine 100 mcg p.o. daily  Type 2 diabetes mellitus Hemoglobin A1c 5.3 on 12/19/2021, diet controlled.  Hyperlipidemia -- Atorvastatin 10 mg p.o. daily  Chronic respiratory failure secondary to OHS -- Supplemental oxygen to maintain SpO2 greater than 92%  Constipation -- MiraLAX daily  Stye/blepharitis OS Completed 5-day course of azithromycin and 10-day course of tobramycin. -- Artificial tears PRN  Morbid obesity Body mass index is 43.73 kg/m.  Discussed with patient needs for aggressive lifestyle changes/weight loss as this complicates all  facets of care.  Outpatient follow-up with PCP.     DVT prophylaxis: apixaban (ELIQUIS) tablet 2.5 mg Start: 04/28/22 1100 apixaban (ELIQUIS) tablet 2.5 mg    Code Status: Full Code Family Communication: No family present at bedside this morning  Disposition Plan:  Level of care: Med-Surg Status is: Inpatient Remains inpatient appropriate because: Medically stable for discharge once outpatient HD seat obtained and SNF bed versus if patient finds housing    Consultants:  Nephrology Infectious disease - signed off 12/1 IR for HD tunneled catheter exchange Vascular surgery Cardiology Orthopedics hand surgery,  Procedures:  TEE Left middle finger amputation 6/19  Antimicrobials:  Azithromycin 1/27 - 1/31 Vancomycin 8/22, 8/24, 10/31,12/1 Doxycycline 11/29 - 11/30 Cefepime 7/28, 7/29, 7/31, 8/2 Metronidazole 7/28 - 8/4   Subjective: Patient seen examined bedside, resting comfortably.  Sitting up in bed.  Asking for "more padding" for her abdominal wound dressing.  Patient excited about her birthday today and that this year will be "a better year".  She reports that she is currently homeless, daughter currently living in a hotel and that they will be trying to find a suitable residence soon.   No other specific questions or concerns at this time.  Discussed with TOC, working on solution for her mobility chair in order for her to go to outpatient dialysis once that is set up will work on SNF placement or if patient finds adequate residential situation. No other specific complaints or concerns this morning.  Denies headache, no dizziness, no chest pain, no shortness of breath, no abdominal pain.  No acute events overnight per nursing staff.  Medically stable for discharge once outpatient HD bed available.  Objective: Vitals:   05/09/22 1850 05/09/22 1920 05/09/22 2016 05/10/22 0622  BP: 134/65 (!) 183/155 (!) 144/44 127/67  Pulse:   (!) 57 (!) 54  Resp: (!) 9 14 15 16   Temp:    97.6 F (36.4 C) 98 F (36.7 C)  TempSrc:   Oral Oral  SpO2: 98% 98% 100% 100%  Weight:      Height:        Intake/Output Summary (Last 24 hours) at 05/10/2022 1224 Last data filed at 05/09/2022 2000 Gross per 24 hour  Intake 120 ml  Output 2500 ml  Net -2380 ml   Filed Weights   05/06/22 1321 05/09/22 0618 05/09/22 1510  Weight: 117.2 kg 119.3 kg 119.2 kg    Examination:  Physical Exam: GEN: NAD, alert and oriented x 3, obese HEENT: NCAT, PERRL, EOMI, sclera clear, MMM, slightly edematous eyelids left eye without erythema PULM: CTAB w/o wheezes/crackles, normal respiratory effort, on 2 L nasal cannula CV: RRR w/o M/G/R GI: abd soft, NTND, NABS, no R/G/M MSK: Noted bilateral AKA, left middle finger amputation, moves upper extremities independently. NEURO: No focal neurological deficits PSYCH: normal mood/affect Integumentary: Sacral wound noted    Data Reviewed: I have personally reviewed following labs and imaging studies  CBC: Recent Labs  Lab 05/06/22 0854 05/09/22 1527  WBC 7.2 8.1  HGB 8.7* 8.7*  HCT 30.3* 29.0*  MCV 95.3 92.9  PLT 214 161   Basic Metabolic Panel: Recent Labs  Lab 05/06/22 0854 05/06/22 1349 05/09/22 1526  NA 136 133* 138  K 5.7* 3.4* 6.6*  CL 98 94* 100  CO2 26 26 26   GLUCOSE 81 124* 87  BUN 67* 21* 76*  CREATININE 9.64* 3.96* 10.65*  CALCIUM 9.8  8.7* 9.5  PHOS 6.8* 2.0* 6.3*   GFR: Estimated Creatinine Clearance: 8 mL/min (A) (by C-G formula based on SCr of 10.65 mg/dL (H)). Liver Function Tests: Recent Labs  Lab 05/06/22 0854 05/06/22 1349 05/09/22 1526  ALBUMIN 3.4* 3.4* 3.2*   No results for input(s): "LIPASE", "AMYLASE" in the last 168 hours. No results for input(s): "AMMONIA" in the last 168 hours. Coagulation Profile: No results for input(s): "INR", "PROTIME" in the last 168 hours. Cardiac Enzymes: No results for input(s): "CKTOTAL", "CKMB", "CKMBINDEX", "TROPONINI" in the last 168 hours. BNP (last 3  results) No results for input(s): "PROBNP" in the last 8760 hours. HbA1C: No results for input(s): "HGBA1C" in the last 72 hours. CBG: No results for input(s): "GLUCAP" in the last 168 hours. Lipid Profile: No results for input(s): "CHOL", "HDL", "LDLCALC", "TRIG", "CHOLHDL", "LDLDIRECT" in the last 72 hours. Thyroid Function Tests: No results for input(s): "TSH", "T4TOTAL", "FREET4", "T3FREE", "THYROIDAB" in the last 72 hours. Anemia Panel: No results for input(s): "VITAMINB12", "FOLATE", "FERRITIN", "TIBC", "IRON", "RETICCTPCT" in the last 72 hours. Sepsis Labs: No results for input(s): "PROCALCITON", "LATICACIDVEN" in the last 168 hours.  No results found for this or any previous visit (from the past 240 hour(s)).       Radiology Studies: No results found.      Scheduled Meds:  apixaban  2.5 mg Oral BID   aspirin EC  81 mg Oral Daily   atorvastatin  10 mg Oral Daily   darbepoetin (ARANESP) injection - DIALYSIS  200 mcg Subcutaneous Q Tue-1800   docusate sodium  100 mg Oral BID   famotidine  20 mg Oral Daily   feeding supplement  1 Container Oral TID BM   fentaNYL  1 patch Transdermal Q72H   gabapentin  100 mg Oral QHS   Gerhardt's butt cream   Topical BID   heparin sodium (porcine)  4,200 Units Intracatheter Once   levothyroxine  100 mcg Oral Q0600   midodrine  10 mg Oral Once per day on Tue Thu Sat   pantoprazole  40 mg Oral Daily   pregabalin  50 mg Oral Daily   sevelamer carbonate  4.8 g Oral TID WC   sucroferric oxyhydroxide  500 mg Oral TID WC   Continuous Infusions:  sodium thiosulfate 12.5 g in sodium chloride 0.9 % 150 mL Infusion for Calciphylaxis Stopped (05/06/22 1446)     LOS: 190 days    Time spent: 48 minutes spent on chart review, discussion with nursing staff, consultants, updating family and interview/physical exam; more than 50% of that time was spent in counseling and/or coordination of care.    Tenleigh Byer J [place] DO [facility] Available via Epic secure chat 7am-7pm After these hours, please refer to coverage provider listed on [email or web address] 05/10/2022, 12:24 PM

## 2022-05-10 NOTE — Progress Notes (Addendum)
   Patient had dialysis yesterday Friday and tolerated.  K was 6.6 used 1K bath for 30 minutes, noted she skipped Thursday HD secondary to constipation / thus  HD schedule now would not be back on Monday Wednesday Friday /     Stable, if any nephrology clinical issues notify [facility],  will not see over weekend call if any nephrology issues , Ernest Haber, PA-C [place] [phone] 05/10/2022,10:03 AM  LOS: 190 days   Labs: Basic Metabolic Panel: Recent Labs  Lab 05/06/22 0854 05/06/22 1349 05/09/22 1526  NA 136 133* 138  K 5.7* 3.4* 6.6*  CL 98 94* 100  CO2 26 26 26   GLUCOSE 81 124* 87  BUN 67* 21* 76*  CREATININE 9.64* 3.96* 10.65*  CALCIUM 9.8 8.7* 9.5  PHOS 6.8* 2.0* 6.3*   Liver Function Tests: Recent Labs  Lab 05/06/22 0854 05/06/22 1349 05/09/22 1526  ALBUMIN 3.4* 3.4* 3.2*   No results for input(s): "LIPASE", "AMYLASE" in the last 168 hours. No results for input(s): "AMMONIA" in the last 168 hours. CBC: Recent Labs  Lab 05/06/22 0854 05/09/22 1527  WBC 7.2 8.1  HGB 8.7* 8.7*  HCT 30.3* 29.0*  MCV 95.3 92.9  PLT 214 223   Cardiac Enzymes: No results for input(s): "CKTOTAL", "CKMB", "CKMBINDEX", "TROPONINI" in the last 168 hours. CBG: No results for input(s): "GLUCAP" in the last 168 hours.  Studies/Results: No results found. Medications:  sodium thiosulfate 12.5 g in sodium chloride 0.9 % 150 mL Infusion for Calciphylaxis Stopped (05/06/22 1446)    apixaban  2.5 mg Oral BID   aspirin EC  81 mg Oral Daily   atorvastatin  10 mg Oral Daily   darbepoetin (ARANESP) injection - DIALYSIS  200 mcg Subcutaneous Q Tue-1800   docusate sodium  100 mg Oral BID   famotidine  20 mg Oral Daily   feeding supplement  1 Container Oral TID BM   fentaNYL  1 patch Transdermal Q72H   gabapentin  100 mg Oral QHS   Gerhardt's butt cream   Topical BID   heparin sodium (porcine)  4,200 Units Intracatheter Once   levothyroxine  100 mcg Oral Q0600    midodrine  10 mg Oral Once per day on Tue Thu Sat   pantoprazole  40 mg Oral Daily   pregabalin  50 mg Oral Daily   sevelamer carbonate  4.8 g Oral TID WC   sucroferric oxyhydroxide  500 mg Oral TID WC

## 2022-05-11 DIAGNOSIS — L89153 Pressure ulcer of sacral region, stage 3: Secondary | ICD-10-CM | POA: Diagnosis not present

## 2022-05-11 DIAGNOSIS — N186 End stage renal disease: Secondary | ICD-10-CM | POA: Diagnosis not present

## 2022-05-11 DIAGNOSIS — E1169 Type 2 diabetes mellitus with other specified complication: Secondary | ICD-10-CM | POA: Diagnosis not present

## 2022-05-11 DIAGNOSIS — I10 Essential (primary) hypertension: Secondary | ICD-10-CM | POA: Diagnosis not present

## 2022-05-11 MED ORDER — SODIUM THIOSULFATE 250 MG/ML IV SOLN
12.5000 g | INTRAVENOUS | Status: DC
  Administered 2022-05-12 – 2022-05-14 (×2): 12.5 g via INTRAVENOUS
  Filled 2022-05-11 (×2): qty 50

## 2022-05-11 NOTE — Plan of Care (Signed)
  Problem: Activity: Goal: Risk for activity intolerance will decrease Outcome: Progressing   Problem: Pain Managment: Goal: General experience of comfort will improve Outcome: Progressing   Problem: Skin Integrity: Goal: Risk for impaired skin integrity will decrease Outcome: Progressing   

## 2022-05-11 NOTE — Progress Notes (Signed)
PROGRESS NOTE    Courtney Gray  LTJ:[id number] DOB: 03/08/1971 DOA: 11/01/2021 PCP: Monico Blitz, MD    Brief Narrative:   Courtney Gray is a 52 y.o. female with past medical history significant for ESRD opn HD TTS, chronic diastolic congestive heart failure, PVD s/p bilateral AKA, type 2 diabetes mellitus, morbid obesity, chronic sacral wound, recent hospitalization for bacteremia with hepatic/splenic abscess on long-term IV antibiotics who presented to [facility] ED on 7/28 with shortness of breath in the setting of missed hemodialysis and admitted for hypertensive emergency.  Patient with long length of stay, discharge pending due to ability to obtain outpatient hemodialysis slot that can accommodate patient's mobility constraints secondary to decubitus ulcer wound and bilateral AKA.  Patient will need to be able to sit HD chair outpatient to get outpatient HD set up.  Hospital HD unit working on getting bariatric HD chair to accommodate patient.  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  She has declined antibiotics due to GI issues and will continue with wound care.  Doxycycline was discontinued on 03/07/2022. 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 -- Continue aspirin, statin, Eliquis  Decubitus ulcer, sacral region, stage III, right hip, left flank Pressure Injury 11/12/21 Hip Right Unstageable - Full thickness tissue loss in which the base of the injury is covered by slough (yellow, tan, gray, green or brown) and/or eschar (tan, brown or black) in the wound bed. 90% covered with slough (Active)  11/12/21 0800  Location: Hip  Location Orientation: Right  Staging: Unstageable - Full thickness tissue loss in which the base of the injury is covered by slough (yellow, tan, gray, green or brown) and/or eschar (tan, brown or black) in the wound bed.  Wound Description (Comments): 90% covered with slough  Present on Admission: Yes (per RN)     Pressure Injury 11/24/21 Flank Left;Lower Stage 2 -  Partial thickness loss of dermis presenting as a shallow open injury with a red, pink wound bed without slough. (Active)  11/24/21 0430  Location: Flank  Location Orientation: Left;Lower  Staging: Stage 2 -  Partial thickness loss of dermis presenting as a shallow open injury with a red, pink wound bed without slough.  Wound Description (Comments):   Present on Admission: No  -- Continue local wound care, offloading  Hypothyroidism TSH 3.935 on 06/11/2021. --Levothyroxine 100 mcg p.o. daily  Type 2 diabetes mellitus Hemoglobin A1c 5.3 on 12/19/2021, diet controlled.  Hyperlipidemia -- Atorvastatin 10 mg p.o. daily  Chronic respiratory failure secondary to OHS -- Supplemental oxygen to maintain SpO2 greater than 92%  Constipation -- MiraLAX daily  Stye/blepharitis OS Completed 5-day course of azithromycin and 10-day course of tobramycin. -- Artificial tears PRN  Morbid obesity Body mass index is 43.73 kg/m.  Discussed with patient needs  for aggressive lifestyle changes/weight loss as this complicates all facets of care.  Outpatient follow-up with PCP.      DVT prophylaxis: apixaban (ELIQUIS) tablet 2.5 mg Start: 04/28/22 1100 apixaban (ELIQUIS) tablet 2.5 mg    Code Status: Full Code Family Communication: No family present at bedside this morning  Disposition Plan:  Level of care: Med-Surg Status is: Inpatient Remains inpatient appropriate because: Medically stable for discharge once outpatient HD seat obtained and SNF bed versus if patient finds housing    Consultants:  Nephrology Infectious disease - signed off 12/1 IR for HD tunneled catheter exchange Vascular surgery Cardiology Orthopedics hand surgery,  Procedures:  TEE Left middle finger amputation 6/19  Antimicrobials:  Azithromycin 1/27 - 1/31 Vancomycin 8/22, 8/24, 10/31,12/1 Doxycycline 11/29 - 11/30 Cefepime 7/28, 7/29, 7/31, 8/2 Metronidazole 7/28 - 8/4   Subjective: Patient seen examined bedside, resting comfortably.  Sitting up in bed.  Reports some bleeding overnight to her abdominal wound, now controlled.  Asking if she can replace her dressing this morning.  RN present.  No other complaints or concerns at this time. Denies headache, no dizziness, no chest pain, no shortness of breath, no abdominal pain.  No acute events overnight per nursing staff.  Medically stable for discharge once outpatient HD bed available.  Objective: Vitals:   05/10/22 1732 05/10/22 2024 05/11/22 0507 05/11/22 1005  BP: (!) 115/53 (!) 147/43 130/63 129/63  Pulse: (!) 54 66 (!) 57 (!) 59  Resp: 15 16 16 17   Temp: 98.2 F (36.8 C) 98.5 F (36.9 C) 98.4 F (36.9 C) (!) 97.5 F (36.4 C)  TempSrc: Oral Oral Oral Oral  SpO2: 100% 100% 100% 100%  Weight:      Height:        Intake/Output Summary (Last 24 hours) at 05/11/2022 1203 Last data filed at 05/11/2022 0841 Gross per 24 hour  Intake 1020 ml  Output 0 ml  Net 1020 ml   Filed Weights   05/06/22 1321 05/09/22 0618 05/09/22 1510  Weight: 117.2 kg 119.3 kg 119.2 kg    Examination:  Physical Exam: GEN: NAD, alert  and oriented x 3, obese HEENT: NCAT, PERRL, EOMI, sclera clear, MMM, slightly edematous eyelids left eye without erythema PULM: CTAB w/o wheezes/crackles, normal respiratory effort, on 2 L nasal cannula CV: RRR w/o M/G/R GI: abd soft, NTND, NABS, no R/G/M MSK: Noted bilateral AKA, left middle finger amputation, moves upper extremities independently. NEURO: No focal neurological deficits PSYCH: normal mood/affect Integumentary: Sacral wound noted, abdominal wound noted with dressing in place    Data Reviewed: I have personally reviewed following labs and imaging studies  CBC: Recent Labs  Lab 05/06/22 0854 05/09/22 1527  WBC 7.2 8.1  HGB 8.7* 8.7*  HCT 30.3* 29.0*  MCV 95.3 92.9  PLT 214 620   Basic Metabolic Panel: Recent Labs  Lab 05/06/22 0854 05/06/22 1349 05/09/22 1526  NA 136 133* 138  K 5.7* 3.4* 6.6*  CL 98 94* 100  CO2 26 26 26   GLUCOSE 81 124* 87  BUN 67* 21* 76*  CREATININE 9.64* 3.96* 10.65*  CALCIUM 9.8 8.7* 9.5  PHOS 6.8* 2.0* 6.3*   GFR: Estimated Creatinine Clearance: 8 mL/min (A) (by C-G formula based on SCr of 10.65 mg/dL (H)). Liver Function Tests: Recent Labs  Lab 05/06/22 0854 05/06/22 1349 05/09/22 1526  ALBUMIN 3.4* 3.4* 3.2*   No results for input(s): "LIPASE", "AMYLASE" in the last 168 hours. No results for input(s): "AMMONIA" in the last 168  hours. Coagulation Profile: No results for input(s): "INR", "PROTIME" in the last 168 hours. Cardiac Enzymes: No results for input(s): "CKTOTAL", "CKMB", "CKMBINDEX", "TROPONINI" in the last 168 hours. BNP (last 3 results) No results for input(s): "PROBNP" in the last 8760 hours. HbA1C: No results for input(s): "HGBA1C" in the last 72 hours. CBG: No results for input(s): "GLUCAP" in the last 168 hours. Lipid Profile: No results for input(s): "CHOL", "HDL", "LDLCALC", "TRIG", "CHOLHDL", "LDLDIRECT" in the last 72 hours. Thyroid Function Tests: No results for input(s): "TSH", "T4TOTAL",  "FREET4", "T3FREE", "THYROIDAB" in the last 72 hours. Anemia Panel: No results for input(s): "VITAMINB12", "FOLATE", "FERRITIN", "TIBC", "IRON", "RETICCTPCT" in the last 72 hours. Sepsis Labs: No results for input(s): "PROCALCITON", "LATICACIDVEN" in the last 168 hours.  No results found for this or any previous visit (from the past 240 hour(s)).       Radiology Studies: No results found.      Scheduled Meds:  apixaban  2.5 mg Oral BID   aspirin EC  81 mg Oral Daily   atorvastatin  10 mg Oral Daily   darbepoetin (ARANESP) injection - DIALYSIS  200 mcg Subcutaneous Q Tue-1800   docusate sodium  100 mg Oral BID   famotidine  20 mg Oral Daily   feeding supplement  1 Container Oral TID BM   fentaNYL  1 patch Transdermal Q72H   gabapentin  100 mg Oral QHS   Gerhardt's butt cream   Topical BID   heparin sodium (porcine)  4,200 Units Intracatheter Once   levothyroxine  100 mcg Oral Q0600   midodrine  10 mg Oral Once per day on Tue Thu Sat   pantoprazole  40 mg Oral Daily   pregabalin  50 mg Oral Daily   sevelamer carbonate  4.8 g Oral TID WC   sucroferric oxyhydroxide  500 mg Oral TID WC   Continuous Infusions:  sodium thiosulfate 12.5 g in sodium chloride 0.9 % 150 mL Infusion for Calciphylaxis Stopped (05/06/22 1446)     LOS: 191 days    Time spent: 48 minutes spent on chart review, discussion with nursing staff, consultants, updating family and interview/physical exam; more than 50% of that time was spent in counseling and/or coordination of care.    Courtney Rief J [place] DO [facility] Available via Epic secure chat 7am-7pm After these hours, please refer to coverage provider listed on [email or web address] 05/11/2022, 12:03 PM

## 2022-05-11 NOTE — Progress Notes (Signed)
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    PT Comments    Continuing work on functional mobility and activity tolerance;  Pt began by describing the bleeding of [name] last night; Provided support and listening; Recommend she get a handheld mirror to be able to visualize her dressings, and recommend she get more education re: wound care; she doesn't tolerate much, if any, drainage on her dressings and wants dressing changes frequently; If dressings are changed too frequently, it can add unneeded trauma to the wound bed;   Ultimately, she declined transfer work today;   This PT shifted to talking about dc planning, and brought up the notion of continuing her work on independence at post-acute rehab; Courtney Gray stated she will not go to SNF (and that she was glad she was at her previous SNF for only two weeks before she returned to the hospital); I conveyed to pt that this Acute Care setting isn't able to give her the routine that she needs to work on the skills for independence, and that PT and OT are daily at a rehab setting, and they are key to achieving her stated goal of independence;    Discussed with Dr. [place] via Secure Chat   Recommendations for follow up therapy are one component of a multi-disciplinary discharge planning process, led by the attending physician.  Recommendations may be updated based on patient status, additional functional criteria and insurance authorization.  Follow Up  Recommendations  Other (comment) (Post-acute Rehab at SNF vs HHPT; has skills to work on at [facility], refusing SNF; unhoused, reports needs to find housing; daughter currently living in hotel) Can patient physically be transported by private vehicle: No   Assistance Recommended at Discharge Intermittent Supervision/Assistance  Patient can return home with the following A little help with walking and/or transfers;Help with stairs or ramp for entrance;Assist for transportation (medical Courtney Gray transport)   Geophysical data processor (measurements PT);Wheelchair cushion (measurements PT);Other (comment);Hospital bed (cushion 20x24, power chair with tilt and recline)    Recommendations for Other Services       Precautions / Restrictions Precautions Precautions: Fall Precaution Comments: bil AKA, 2L O2 at baseline (has been tolerating mobility in power chair on [employer] fine); when in power chair, MUST perform pressure relief every 30 minutes by tilting back for 10 minutes Restrictions Other Position/Activity Restrictions: when in power chair, MUST perform pressure relief every 30 minutes by tilting back for 10 minutes     Mobility  Bed Mobility Overal bed mobility: Modified Independent                  Transfers                        Ambulation/Gait                   Stairs             Wheelchair Mobility    Modified Rankin (Stroke Patients Only)       Balance  Sitting balance - Comments: Able to sit unsupported in bed and powerchair without difficulties                                    Cognition Arousal/Alertness: Awake/alert Behavior During Therapy: WFL for tasks assessed/performed Overall Cognitive Status: Within Functional Limits for tasks assessed                                 General Comments: very focused on her L flank/abdominal wound bleeding last night        Exercises       General Comments        Pertinent Vitals/Pain Pain Assessment Faces Pain Scale: Hurts a little bit Pain Intervention(s): Patient requesting pain meds-RN notified    Home Living                          Prior Function            PT Goals (current goals can now be found in the care plan section) Acute Rehab PT Goals Patient Stated Goal: Reports wants to be up in power chair and be more independent; does not want to dc to SNF; wants to dc to private residence PT Goal Formulation: Patient unable to participate in goal setting Time For Goal Achievement: 05/25/22 Potential to Achieve Goals: Good Additional Goals Additional Goal #1: Pt will demonstrate how to perform pressure relief in power wc -- floor staff will report that she performs pressure relief without prompting. Progress towards PT goals:  (Goals updated)    Frequency    Min 3X/week      PT Plan Other (comment)  Pt wants to dc to private residence;       [facility] is that pt is currently without housing;  Worth revisitting [facility];      Has skills deficits that skilled PT and OT can work on at post-acute rehab;      Vendor is willing to let power chair continue to be a loaner at [facility];     Co-evaluation              AM-PAC PT "6 Clicks" Mobility   Outcome Measure  Help needed turning from your back to your side while in a flat bed without using bedrails?: None Help needed moving from lying on your back to sitting on the side of a flat bed without using bedrails?: None Help needed moving to and from a bed to a chair (including a wheelchair)?: A Little Help needed standing up from a chair using your arms (e.g., wheelchair or bedside chair)?: Total Help needed to walk in hospital room?: Total Help needed climbing 3-5 steps with a railing? : Total 6 Click Score: 14    End of Session   Activity Tolerance: Patient tolerated treatment well;Other (comment) (though upset at  notion of going to post-acute rehab) Patient left: in bed;with call bell/phone within reach Nurse Communication: Mobility status PT Visit Diagnosis: Other abnormalities of gait and mobility (R26.89);Muscle weakness (generalized) (M62.81) Pain - Right/Left: Left Pain - part of body:  (abdomen/flank)     Time: 1610-9604 PT Time Calculation (min) (ACUTE ONLY): 20 min  Charges:  $Self Care/Home Management: 8-22  Van Clines, PT  Acute Rehabilitation Services Office [phone]    Levi Aland 05/11/2022, 7:38 PM

## 2022-05-11 NOTE — Progress Notes (Signed)
Subjective: No current complaints. next dialysis tomorrow Monday 05/12/22  Objective Vital signs in last 24 hours: Vitals:   05/10/22 1732 05/10/22 2024 05/11/22 0507 05/11/22 1005  BP: (!) 115/53 (!) 147/43 130/63 129/63  Pulse: (!) 54 66 (!) 57 (!) 59  Resp: 15 16 16 17   Temp: 98.2 F (36.8 C) 98.5 F (36.9 C) 98.4 F (36.9 C) (!) 97.5 F (36.4 C)  TempSrc: Oral Oral Oral Oral  SpO2: 100% 100% 100% 100%  Weight:      Height:       Weight change:   PPhysical Exam: General: Obese, ill-appearing female NAD chronic nasal cannula oxygen Heart: RRR no MRG Lungs: CTA nonlabored breathing this cannula oxygen chronic Abdomen: Obese, NABS, soft bandages to bilateral lateral pannus wounds dry clean Extremities: Bilateral AKA no stump edema Dialysis Access: TDC,     OP dialysis orders= prior [facility].// not yet candidate for OP HD   Problem/Plan: Debility: Has been unable to tolerate dialysis in recliner and therefore unable to discharge to outpatient dialysis. She has lost significant weight this admit (> 50 lbs). Sacral decub ulcers remain a barrier. She now has new powerchair/cushion and has been sitting for longer periods.  Appreciate efforts from PT team.   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 No calcium based products or vitamin D. Gets 1/2 dose NaThiosulfate 12.5g q HD - could not tolerate higher dose. HTN /volume: On midodrine 10mg  qHD, with 10mg  prn mid HD w/parameters to keep SBP >100 d/t becoming symptomatic when it drops lower. Significant weight loss this admit Severe ischial sacral decubitus wound: CT showing chronic osteomyelitis. Limiting her, noted to be slow healing. Goal to tolerate sitting upright - which she is doing with PT.  COPD/Chronic O2 use: Prior O2 at home (5L/min) -> tapered to 2-3L/min here Anemia of ESRD: Hgb  8.7  - on max dose Aranesp 265mcg q Tues. Recent Fe load completed.,  Follow-up lab trend Secondary hyperparathyroidism: On two binders. CorrCa elevated. Not on VDRA, last PTH 86 (low) on 03/11/22 -> repeat PTH 124 (05/06/22 ) avoid all calcium based medications for Vitamin D d/t concern for calciphylaxis.  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 Nutrition - Alb 3.2 on regular diet.  Would really prefer she be on a renal diet but she refuses. Disposition: Difficult situation. Prior to admit, patient required 6 staff members and 2 hoyer pads for transfer into HD chair. Could not stay on HD for full tx. 2/2 pain of multiple decubitus ulcers and PTAR unable to lift pt into ambulance for transport.SW working on LTC at [employer]. Will need to tolerate 5-6hrs in a chair to tolerate OP dialysis including transport, treatment and cannulation time.  The main issue is her Rt ischial wound. She now has new power chair and cushion. She has been sitting in the chair for longer periods. Up to 4 hours past week.   Ernest Haber, PA-C [facility] Beeper [phone] 05/11/2022,10:49 AM  LOS: 191 days   Labs: Basic Metabolic Panel: Recent Labs  Lab 05/06/22 0854 05/06/22 1349 05/09/22 1526  NA 136 133* 138  K 5.7* 3.4* 6.6*  CL 98 94* 100  CO2 26 26 26   GLUCOSE 81 124* 87  BUN 67* 21* 76*  CREATININE 9.64* 3.96* 10.65*  CALCIUM 9.8 8.7* 9.5  PHOS 6.8*  2.0* 6.3*   Liver Function Tests: Recent Labs  Lab 05/06/22 0854 05/06/22 1349 05/09/22 1526  ALBUMIN 3.4* 3.4* 3.2*   No results for input(s): "LIPASE", "AMYLASE" in the last 168 hours. No results for input(s): "AMMONIA" in the last 168 hours. CBC: Recent Labs  Lab 05/06/22 0854 05/09/22 1527  WBC 7.2 8.1  HGB 8.7* 8.7*  HCT 30.3* 29.0*  MCV 95.3 92.9  PLT 214 223   Cardiac Enzymes: No results for input(s): "CKTOTAL", "CKMB", "CKMBINDEX", "TROPONINI" in the last 168 hours. CBG: No results for input(s):  "GLUCAP" in the last 168 hours.  Studies/Results: No results found. Medications:  sodium thiosulfate 12.5 g in sodium chloride 0.9 % 150 mL Infusion for Calciphylaxis Stopped (05/06/22 1446)    apixaban  2.5 mg Oral BID   aspirin EC  81 mg Oral Daily   atorvastatin  10 mg Oral Daily   darbepoetin (ARANESP) injection - DIALYSIS  200 mcg Subcutaneous Q Tue-1800   docusate sodium  100 mg Oral BID   famotidine  20 mg Oral Daily   feeding supplement  1 Container Oral TID BM   fentaNYL  1 patch Transdermal Q72H   gabapentin  100 mg Oral QHS   Gerhardt's butt cream   Topical BID   heparin sodium (porcine)  4,200 Units Intracatheter Once   levothyroxine  100 mcg Oral Q0600   midodrine  10 mg Oral Once per day on Tue Thu Sat   pantoprazole  40 mg Oral Daily   pregabalin  50 mg Oral Daily   sevelamer carbonate  4.8 g Oral TID WC   sucroferric oxyhydroxide  500 mg Oral TID WC

## 2022-05-12 DIAGNOSIS — L89153 Pressure ulcer of sacral region, stage 3: Secondary | ICD-10-CM | POA: Diagnosis not present

## 2022-05-12 DIAGNOSIS — N186 End stage renal disease: Secondary | ICD-10-CM | POA: Diagnosis not present

## 2022-05-12 DIAGNOSIS — I10 Essential (primary) hypertension: Secondary | ICD-10-CM | POA: Diagnosis not present

## 2022-05-12 DIAGNOSIS — E1169 Type 2 diabetes mellitus with other specified complication: Secondary | ICD-10-CM | POA: Diagnosis not present

## 2022-05-12 LAB — RENAL FUNCTION PANEL
Albumin: 3.3 g/dL — ABNORMAL LOW (ref 3.5–5.0)
Anion gap: 16 — ABNORMAL HIGH (ref 5–15)
BUN: 72 mg/dL — ABNORMAL HIGH (ref 6–20)
CO2: 25 mmol/L (ref 22–32)
Calcium: 9.6 mg/dL (ref 8.9–10.3)
Chloride: 98 mmol/L (ref 98–111)
Creatinine, Ser: 10.49 mg/dL — ABNORMAL HIGH (ref 0.44–1.00)
GFR, Estimated: 4 mL/min — ABNORMAL LOW (ref >60)
Glucose, Bld: 121 mg/dL — ABNORMAL HIGH (ref 70–99)
Phosphorus: 6.8 mg/dL — ABNORMAL HIGH (ref 2.5–4.6)
Potassium: 6.3 mmol/L (ref 3.5–5.1)
Sodium: 139 mmol/L (ref 135–145)

## 2022-05-12 LAB — CBC
HCT: 28.5 % — ABNORMAL LOW (ref 36.0–46.0)
Hemoglobin: 8.4 g/dL — ABNORMAL LOW (ref 12.0–15.0)
MCH: 27.8 pg (ref 26.0–34.0)
MCHC: 29.5 g/dL — ABNORMAL LOW (ref 30.0–36.0)
MCV: 94.4 fL (ref 80.0–100.0)
Platelets: 200 10*3/uL (ref 150–400)
RBC: 3.02 MIL/uL — ABNORMAL LOW (ref 3.87–5.11)
RDW: 16.8 % — ABNORMAL HIGH (ref 11.5–15.5)
WBC: 7.4 10*3/uL (ref 4.0–10.5)
nRBC: 0 % (ref 0.0–0.2)

## 2022-05-12 MED ORDER — MIDODRINE HCL 5 MG PO TABS
10.0000 mg | ORAL_TABLET | ORAL | Status: DC
  Administered 2022-05-12 – 2022-05-14 (×2): 10 mg via ORAL
  Filled 2022-05-12 (×3): qty 2

## 2022-05-12 NOTE — Progress Notes (Signed)
Pt's case discussed with CSW this am. Pt has snf offers that pt is considering. Contacted [facility] to inquire if pt was ever d/c from clinic. Pt is still considered a pt at [facility]. Update provided to clinic staff regarding pt's current weight and pt's ability to sit in specialized electric/tilt w/c. Updated clinicals faxed to clinic for review. Clinic to order a bariatric HD chair (larger than the normal bariatric chair) to allow pt more room to move to be able to offload wound as needed. Will await clinic's response regarding review of clinicals and possible schedule. Clinic is requesting that snf that pt admits to will need to send pt in an appropriate hoyer pad/sling that will allow pt to transfer safely from w/c to HD chair. This info was provided to CSW to provide to snf once pt makes decision regarding snf choice. Update provided to CSW and renal PA. Will assist as needed.   Melven Sartorius Renal Navigator [phone]

## 2022-05-12 NOTE — TOC Progression Note (Signed)
Transition of Care [facility]) - Initial/Assessment Note    Patient Details  Name: Courtney Gray MRN: [id number] Date of Birth: 03/30/1971  [facility] CM/SW Contact:    Milinda Antis, LCSWA Phone Number: 05/12/2022, 1:17 PM  Clinical Narrative:                 LCSW met with the patient at bedside to present bed offers.  The patient will review list with daughter and inform LCSW in the morning (05/13/2022) of SNF choice vs home with home health.  Renal navigator informed and contacting dialysis clinics to arrange outpatient dialysis.    TOC following.    Expected Discharge Plan: Long Term Nursing Home Barriers to Discharge: No SNF bed, Transportation, Waiting for outpatient dialysis   Patient Goals and CMS Choice Patient states their goals for this hospitalization and ongoing recovery are:: To be at a facility close to her daughter          Expected Discharge Plan and Services       Living arrangements for the past 2 months: Single Family Home                                      Prior Living Arrangements/Services Living arrangements for the past 2 months: Single Family Home Lives with:: Adult Children Patient language and need for interpreter reviewed:: Yes        Need for Family Participation in Patient Care: Yes (Comment) Care giver support system in place?: No (comment)   Criminal Activity/Legal Involvement Pertinent to Current Situation/Hospitalization: No - Comment as needed  Activities of Daily Living Home Assistive Devices/Equipment: [employer] fast streamer ADL Screening (condition at time of admission) Patient's cognitive ability adequate to safely complete daily activities?: No Is the patient deaf or have difficulty hearing?: No Does the patient have difficulty seeing, even when wearing glasses/contacts?: No Does the patient have difficulty concentrating, remembering, or making decisions?: No Patient able to express need for assistance with ADLs?:  Yes Does the patient have difficulty dressing or bathing?: Yes Independently performs ADLs?: No Does the patient have difficulty walking or climbing stairs?: Yes Weakness of Legs: Both Weakness of Arms/Hands: Both  Permission Sought/Granted Permission sought to share information with : Other (comment) Permission granted to share information with : Yes, Verbal Permission Granted  Share Information with NAME: Goytia,antoinette (Daughter)   [phone]  Permission granted to share info w AGENCY: SNF        Emotional Assessment Appearance:: Appears older than stated age Attitude/Demeanor/Rapport: Engaged Affect (typically observed): Tearful/Crying, Accepting Orientation: : Oriented to Situation, Oriented to  Time, Oriented to Place, Oriented to Self Alcohol / Substance Use: Not Applicable Psych Involvement: No (comment)  Admission diagnosis:  End stage renal disease ([place]) [N18.6] Fluid overload [E87.70] Obesity with serious comorbidity, unspecified classification, unspecified obesity type [E66.9] Pressure injury of right buttock, stage 2 ([place]) [L89.312] Patient Active Problem List   Diagnosis Date Noted   Nodule of skin of abdomen 02/16/2022   Acute on chronic systolic CHF (congestive heart failure) ([place]) 02/11/2022   Acute on chronic diastolic CHF (congestive heart failure) ([place]) 12/12/2021   PAD (peripheral artery disease) ([place]) 12/12/2021   Irritable bowel syndrome (IBS) 12/12/2021   Amputation of left middle finger 12/12/2021   GERD (gastroesophageal reflux disease) 12/12/2021   Hypertension    Type 2 diabetes mellitus with hyperlipidemia ([place])    Decubitus ulcer of  sacral region, stage 3 (HCC)    Class 3 obesity (HCC)    Bacteremia    PVD (peripheral vascular disease) ([place])    Hepatic abscess    Pressure injury of skin 09/13/2021   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[place])    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   Lactic acidosis 09/10/2021   Mixed diabetic  hyperlipidemia associated with type 2 diabetes mellitus ([place]) 09/10/2021   Coronary artery disease involving native coronary artery of native heart without angina pectoris 09/10/2021   Elevated troponin level not due myocardial infarction 09/10/2021   Hypotension 09/10/2021   Dry gangrene (HCC)    Pain 08/03/2021   Ischemic foot pain at rest 08/03/2021   Hypoglycemia 07/10/2021   Volume overload 07/09/2021   Leukocytosis 06/11/2021   Acute gout 06/11/2021   Hyperkalemia 05/21/2021   Hemorrhoids    Hyperlipidemia 05/18/2021   Rectal bleeding 05/17/2021   Morbid obesity with BMI of 60.0-69.9, adult ([place]) 05/17/2021   NSTEMI (non-ST elevated myocardial infarction) ([place]) 05/16/2021   Essential hypertension 09/01/2020   End stage renal disease ([place]) 09/01/2020   Chronic respiratory failure with hypoxia ([place]) 09/01/2020   Hypertensive emergency    Hypertensive urgency 11/28/2019   Uncontrolled type 2 diabetes mellitus with hypoglycemia, with long-term current use of insulin ([place]) 11/28/2019   Anemia due to chronic kidney disease 11/28/2019   PCP:  Monico Blitz, MD Pharmacy:   [facility] [place] [address] [place] [address] Phone: [phone] Fax: [phone]  [facility] [address] [place] [address] Phone: [phone] Fax: [phone]  [place], [place] - [address] [place] [address] Phone: [phone] Fax: [phone]     Social Determinants of Health (SDOH) Social History: Overlea: No Food Insecurity (12/11/2021)  Housing: Low Risk  (12/11/2021)  Transportation Needs: No Transportation Needs (12/11/2021)  Utilities: At Risk (12/11/2021)  Tobacco Use: Low Risk  (03/07/2022)   SDOH Interventions:     Readmission Risk Interventions    09/16/2021    4:36 PM 08/28/2021   11:50 AM 07/12/2021   12:38 PM   Readmission Risk Prevention Plan  Transportation Screening Complete Complete Complete  Medication Review Press photographer)  Complete Complete  PCP or Specialist appointment within 3-5 days of discharge  Complete Complete  HRI or Home Care Consult Complete Complete Complete  SW Recovery Care/Counseling Consult Complete Complete Complete  Palliative Care Screening  Not Applicable Not Applicable  Skilled Nursing Facility Complete Not Applicable Complete

## 2022-05-12 NOTE — Progress Notes (Signed)
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   OT comments  OT entering as case management exiting after conversation with pt regarding pending DC. Pt reports "I havent even transferred (to BSC/toilet) and they're kicking me out". Provided space for listening and OT was able to locate oversized drop arm BSC to trial today. After assisting with LB dressing to prevent skin shearing for transfer, setup BSC for AP transfer. However, when time to transfer, pt ultimately declined as DME was not up to reported standards (height unable to be adjusted though only 1.5inch height difference w/ surfaces and pt reporting armrests too wobbly). Unable to educate and engage in [facility] transfer as planned today. During session, pt reports unsure if planning to DC to SNF or to motel room w/ daughter - feel both are viable options for pt.   Recommendations for follow up therapy are one component of a multi-disciplinary discharge planning process, led by the attending physician.  Recommendations may be updated based on patient status, additional functional criteria and insurance authorization.    Follow Up Recommendations  Skilled nursing-short term rehab (<3 hours/day) (though pt declining SNF; would benefit from [facility] follow up if opting to DC to extended stay motel with daughter)     Assistance Recommended at Discharge Intermittent Supervision/Assistance  Patient can return home with the following  A little help with walking and/or transfers;A little  help with bathing/dressing/bathroom   Equipment Recommendations  Hospital bed;Other (comment) (power chair with tilt in space features; bariatric BSC (drop arm may be helpful for increased transfer options))    Recommendations for Other Services      Precautions / Restrictions Precautions Precautions: Fall Precaution Comments: bil AKA, 2L O2 at baseline (has been tolerating mobility in power chair on [employer] fine); when in power chair, MUST perform pressure relief every 30 minutes by tilting back for 10 minutes Restrictions Weight Bearing Restrictions: Yes RLE Weight Bearing: Non weight bearing LLE Weight Bearing: Non weight bearing       Mobility Bed Mobility Overal bed mobility: Modified Independent                  Transfers                         Balance                                           ADL either performed or assessed with clinical judgement   ADL Overall ADL's : Needs assistance/impaired                     Lower Body Dressing: Moderate assistance;Bed level;Sitting/lateral leans Lower Body Dressing Details (indicate cue type and reason): pt able to don underwear around residual limbs, then instructed OT to pull up underwear over bottom while she leaned forward on bedrails at foot of bed   Toilet Transfer Details (indicate cue type and reason):  Located oversized (drop arm) BSC for trial of transfers. Noted one BSC leg rusted and could not be adjusted to same height so OT leveled surface with folded linens and placed against bed for AP transfer. Once time to transfer, pt reported armrests too wobbly (though functional and would have worked fine for session) and that since [facility] could not be raised for  transfer (only 1.5 inch surface height difference w/ current setup), pt declined to attempt transfer. attempted to educate that if transferring back uphill was difficult then OT can assist with bed pad as main purpose was to  assess if this BSC would give enough room for hygiene, pt still declined. Educated pt that oversized BSC difficult to locate in this setting and current BSC in decent condition.                Extremity/Trunk Assessment Upper Extremity Assessment Upper Extremity Assessment: Overall WFL for tasks assessed   Lower Extremity Assessment Lower Extremity Assessment: Defer to PT evaluation        Vision   Vision Assessment?: No apparent visual deficits   Perception     Praxis      Cognition Arousal/Alertness: Awake/alert Behavior During Therapy: WFL for tasks assessed/performed Overall Cognitive Status: Within Functional Limits for tasks assessed                                          Exercises      Shoulder Instructions       General Comments case manager and nephrology had just left pt room with pt tearful saying "they're kicking me out". provided space to listen to pt concerns, gently again educated on how to search for apartment when at acute level despite not being able to go out to see apartments at this time. Pt's daughter able to send pictures of hotel room and bathroom setup to engage in discussion for toilet transfers, bed height for transfers, etc.    Pertinent Vitals/ Pain       Pain Assessment Pain Assessment: Faces Faces Pain Scale: No hurt Pain Intervention(s): Monitored during session  Home Living                                          Prior Functioning/Environment              Frequency  Min 1X/week        Progress Toward Goals  OT Goals(current goals can now be found in the care plan section)  Progress towards OT goals: OT to reassess next treatment  Acute Rehab OT Goals Patient Stated Goal: find someplace to live OT Goal Formulation: With patient Time For Goal Achievement: 05/16/22 Potential to Achieve Goals: Good  Plan Discharge plan remains appropriate;Frequency remains appropriate     Co-evaluation                 AM-PAC OT "6 Clicks" Daily Activity     Outcome Measure   Help from another person eating meals?: None Help from another person taking care of personal grooming?: A Little Help from another person toileting, which includes using toliet, bedpan, or urinal?: A Lot Help from another person bathing (including washing, rinsing, drying)?: A Lot Help from another person to put on and taking off regular upper body  clothing?: A Little Help from another person to put on and taking off regular lower body clothing?: A Lot 6 Click Score: 16    End of Session Equipment Utilized During Treatment: Oxygen  OT Visit Diagnosis: Muscle weakness (generalized) (M62.81);History of falling (Z91.81) Pain - Right/Left: Left Pain - part of body: Leg   Activity Tolerance Other (comment) (self limiting)   Patient Left in bed;with call bell/phone within reach   Nurse Communication Mobility status        Time: 1859-0931 OT Time Calculation (min): 32 min  Charges: OT General Charges $OT Visit: 1 Visit OT Treatments $Self Care/Home Management : 23-37 mins  Malachy Chamber, OTR/L Acute Rehab Services Office: [phone]   Layla Maw 05/12/2022, 11:00 AM

## 2022-05-12 NOTE — Progress Notes (Signed)
PROGRESS NOTE    Courtney Gray  SJG:[id number] DOB: 1970-07-21 DOA: 11/01/2021 PCP: Monico Blitz, MD    Brief Narrative:   Courtney Gray is a 52 y.o. female with past medical history significant for ESRD opn HD TTS, chronic diastolic congestive heart failure, PVD s/p bilateral AKA, type 2 diabetes mellitus, morbid obesity, chronic sacral wound, recent hospitalization for bacteremia with hepatic/splenic abscess on long-term IV antibiotics who presented to [facility] ED on 7/28 with shortness of breath in the setting of missed hemodialysis and admitted for hypertensive emergency.  Patient with long length of stay, discharge pending due to ability to obtain outpatient hemodialysis slot that can accommodate patient's mobility constraints secondary to decubitus ulcer wound and bilateral AKA.  Patient will need to be able to sit HD chair outpatient to get outpatient HD set up.  Hospital HD unit working on getting bariatric HD chair to accommodate patient.  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  She has declined antibiotics due to GI issues and will continue with wound care.  Doxycycline was discontinued on 03/07/2022. 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 -- Continue aspirin, statin, Eliquis  Decubitus ulcer, sacral region, stage III, right hip, left flank Pressure Injury 11/12/21 Hip Right Unstageable - Full thickness tissue loss in which the base of the injury is covered by slough (yellow, tan, gray, green or brown) and/or eschar (tan, brown or black) in the wound bed. 90% covered with slough (Active)  11/12/21 0800  Location: Hip  Location Orientation: Right  Staging: Unstageable - Full thickness tissue loss in which the base of the injury is covered by slough (yellow, tan, gray, green or brown) and/or eschar (tan, brown or black) in the wound bed.  Wound Description (Comments): 90% covered with slough  Present on Admission: Yes (per RN)     Pressure Injury 11/24/21 Flank Left;Lower Stage 2 -  Partial thickness loss of dermis presenting as a shallow open injury with a red, pink wound bed without slough. (Active)  11/24/21 0430  Location: Flank  Location Orientation: Left;Lower  Staging: Stage 2 -  Partial thickness loss of dermis presenting as a shallow open injury with a red, pink wound bed without slough.  Wound Description (Comments):   Present on Admission: No  -- Continue local wound care, offloading  Hypothyroidism TSH 3.935 on 06/11/2021. --Levothyroxine 100 mcg p.o. daily  Type 2 diabetes mellitus Hemoglobin A1c 5.3 on 12/19/2021, diet controlled.  Hyperlipidemia -- Atorvastatin 10 mg p.o. daily  Chronic respiratory failure secondary to OHS -- Supplemental oxygen to maintain SpO2 greater than 92%  Constipation -- MiraLAX daily  Stye/blepharitis OS Completed 5-day course of azithromycin and 10-day course of tobramycin. -- Artificial tears PRN  Morbid obesity Body mass index is 43.73 kg/m.  Discussed with patient needs  for aggressive lifestyle changes/weight loss as this complicates all facets of care.  Outpatient follow-up with PCP.      DVT prophylaxis: apixaban (ELIQUIS) tablet 2.5 mg Start: 04/28/22 1100 apixaban (ELIQUIS) tablet 2.5 mg    Code Status: Full Code Family Communication: No family present at bedside this morning  Disposition Plan:  Level of care: Med-Surg Status is: Inpatient Remains inpatient appropriate because: Medically stable for discharge once outpatient HD seat obtained and SNF bed versus if patient finds housing    Consultants:  Nephrology Infectious disease - signed off 12/1 IR for HD tunneled catheter exchange Vascular surgery Cardiology Orthopedics hand surgery,  Procedures:  TEE Left middle finger amputation 6/19  Antimicrobials:  Azithromycin 1/27 - 1/31 Vancomycin 8/22, 8/24, 10/31,12/1 Doxycycline 11/29 - 11/30 Cefepime 7/28, 7/29, 7/31, 8/2 Metronidazole 7/28 - 8/4   Subjective: Patient seen examined bedside, resting comfortably.  Sitting up in bed.  Patient reported to physical therapy last night that she was not able to participate because the "doctor told her not to mobilize because of her wound".  Discussed with PT that this conversation never happened.  Also discussed with patient this morning that needs to find housing very soon as she is getting near for discharge as discussed that we will have a contract for her power wheelchair and that nephrology will start working on outpatient dialysis seat for her as well.  Patient became upset and stated "I have nowhere to go", "[place] am not from [place], I used to live in [place] and I know nothing about this town".  Discussed with patient that she is now been here 190+ days and she needs to be more aggressive of finding suitable housing for her even though her daughter currently lives in a hotel which would be reasonable accommodations.  Discussed with charge RN this morning. No other complaints or concerns at this time. Denies headache, no dizziness, no chest pain, no shortness of breath, no abdominal pain.  No acute events  overnight per nursing staff.  Medically stable for discharge once outpatient HD bed available.  Objective: Vitals:   05/11/22 1529 05/11/22 2126 05/12/22 0620 05/12/22 0834  BP: 138/67 (!) 120/30 125/60 (!) 148/73  Pulse: 61 60 (!) 59 68  Resp: 18 18 18 18   Temp: 97.9 F (36.6 C) (!) 97.5 F (36.4 C) 98.4 F (36.9 C) 98.3 F (36.8 C)  TempSrc: Oral Oral Oral Oral  SpO2: 100% 100% 100% 100%  Weight:      Height:        Intake/Output Summary (Last 24 hours) at 05/12/2022 0852 Last data filed at 05/12/2022 0600 Gross per 24 hour  Intake 360 ml  Output 0 ml  Net 360 ml   Filed Weights   05/06/22 1321 05/09/22 0618 05/09/22 1510  Weight: 117.2 kg 119.3 kg 119.2 kg    Examination:  Physical Exam: GEN: NAD, alert and oriented x 3, obese HEENT: NCAT, PERRL, EOMI, sclera clear, MMM PULM: CTAB w/o wheezes/crackles, normal respiratory effort, on 2 L nasal cannula CV: RRR w/o M/G/R GI: abd soft, NTND, NABS, no R/G/M MSK: Noted bilateral AKA, left middle finger amputation, moves upper extremities independently. NEURO: No focal neurological deficits PSYCH: normal mood/affect Integumentary: Sacral wound noted, abdominal wound noted with dressing in place    Data Reviewed: I have personally reviewed following labs and imaging studies  CBC: Recent Labs  Lab 05/06/22 0854 05/09/22 1527  WBC 7.2 8.1  HGB 8.7* 8.7*  HCT 30.3* 29.0*  MCV 95.3  92.9  PLT 214 326   Basic Metabolic Panel: Recent Labs  Lab 05/06/22 0854 05/06/22 1349 05/09/22 1526  NA 136 133* 138  K 5.7* 3.4* 6.6*  CL 98 94* 100  CO2 26 26 26   GLUCOSE 81 124* 87  BUN 67* 21* 76*  CREATININE 9.64* 3.96* 10.65*  CALCIUM 9.8 8.7* 9.5  PHOS 6.8* 2.0* 6.3*   GFR: Estimated Creatinine Clearance: 8 mL/min (A) (by C-G formula based on SCr of 10.65 mg/dL (H)). Liver Function Tests: Recent Labs  Lab 05/06/22 0854 05/06/22 1349 05/09/22 1526  ALBUMIN 3.4* 3.4* 3.2*   No results for input(s): "LIPASE",  "AMYLASE" in the last 168 hours. No results for input(s): "AMMONIA" in the last 168 hours. Coagulation Profile: No results for input(s): "INR", "PROTIME" in the last 168 hours. Cardiac Enzymes: No results for input(s): "CKTOTAL", "CKMB", "CKMBINDEX", "TROPONINI" in the last 168 hours. BNP (last 3 results) No results for input(s): "PROBNP" in the last 8760 hours. HbA1C: No results for input(s): "HGBA1C" in the last 72 hours. CBG: No results for input(s): "GLUCAP" in the last 168 hours. Lipid Profile: No results for input(s): "CHOL", "HDL", "LDLCALC", "TRIG", "CHOLHDL", "LDLDIRECT" in the last 72 hours. Thyroid Function Tests: No results for input(s): "TSH", "T4TOTAL", "FREET4", "T3FREE", "THYROIDAB" in the last 72 hours. Anemia Panel: No results for input(s): "VITAMINB12", "FOLATE", "FERRITIN", "TIBC", "IRON", "RETICCTPCT" in the last 72 hours. Sepsis Labs: No results for input(s): "PROCALCITON", "LATICACIDVEN" in the last 168 hours.  No results found for this or any previous visit (from the past 240 hour(s)).       Radiology Studies: No results found.      Scheduled Meds:  apixaban  2.5 mg Oral BID   aspirin EC  81 mg Oral Daily   atorvastatin  10 mg Oral Daily   darbepoetin (ARANESP) injection - DIALYSIS  200 mcg Subcutaneous Q Tue-1800   docusate sodium  100 mg Oral BID   famotidine  20 mg Oral Daily   feeding supplement  1 Container Oral TID BM   fentaNYL  1 patch Transdermal Q72H   gabapentin  100 mg Oral QHS   Gerhardt's butt cream   Topical BID   heparin sodium (porcine)  4,200 Units Intracatheter Once   levothyroxine  100 mcg Oral Q0600   midodrine  10 mg Oral Once per day on Tue Thu Sat   pantoprazole  40 mg Oral Daily   pregabalin  50 mg Oral Daily   sevelamer carbonate  4.8 g Oral TID WC   sucroferric oxyhydroxide  500 mg Oral TID WC   Continuous Infusions:  sodium thiosulfate 12.5 g in sodium chloride 0.9 % 150 mL Infusion for Calciphylaxis        LOS: 192 days    Time spent: 48 minutes spent on chart review, discussion with nursing staff, consultants, updating family and interview/physical exam; more than 50% of that time was spent in counseling and/or coordination of care.    Zaylan Kissoon J [place] DO [facility] Available via Epic secure chat 7am-7pm After these hours, please refer to coverage provider listed on [email or web address] 05/12/2022, 8:52 AM

## 2022-05-12 NOTE — Progress Notes (Signed)
PT Cancellation Note  Patient Details Name: Courtney Gray MRN: [id number] DOB: Sep 04, 1970   Cancelled Treatment:    Reason Eval/Treat Not Completed: Patient at procedure or test/unavailable  Currently in HD;  Will follow up later today as time allows;  Otherwise, will follow up for PT tomorrow;   Discussed next steps to get specialty [facility] filed for with insurance with vendor;  Plan for formal Wheelchair Evaluation on Thursday at 9:30 am;   Thank you,  Roney Marion, [place] [facility] [place] 05/12/2022, 3:56 PM

## 2022-05-12 NOTE — Progress Notes (Signed)
Received patient in bed to unit.  Alert and oriented.  Informed consent signed and in chart.   TX duration: 3.5hrs  Patient tolerated well.  Transported back to the room  Alert, without acute distress.  Hand-off given to patient's nurse.   Access used: L IJ Access issues: None  Total UF removed: 2030ml Medication(s) given: Sodium Thiosulfate 12.5g; oxy 5mg   Post HD weight: 116.2kg   05/12/22 1706  Vitals  Temp (!) 97.3 F (36.3 C)  Temp Source Oral  BP (!) 123/90  MAP (mmHg) 102  BP Location Right Arm  BP Method Automatic  Patient Position (if appropriate) Lying  Pulse Rate (!) 47  ECG Heart Rate (!) 49  Resp 13  Oxygen Therapy  SpO2 100 %  O2 Device Nasal Cannula  O2 Flow Rate (L/min) 2 L/min  Patient Activity (if Appropriate) In bed  Pulse Oximetry Type Continuous  During Treatment Monitoring  Intra-Hemodialysis Comments Tolerated well;Tx completed  Post Treatment  Dialyzer Clearance Lightly streaked  Duration of HD Treatment -hour(s) 3.5 hour(s)  Liters Processed 84  Fluid Removed (mL) 2050 mL  Tolerated HD Treatment Yes  Hemodialysis Catheter Left Internal jugular Double lumen Permanent (Tunneled)  Placement Date/Time: 03/07/22 1105   Serial / Lot #: [id number]  Expiration Date: 09/09/26  Time Out: Correct patient;Correct site;Correct procedure  Maximum sterile barrier precautions: Hand hygiene;Cap;Mask;Sterile gown;Sterile gloves;Large sterile ...  Site Condition No complications  Blue Lumen Status Dead end cap in place;Heparin locked  Red Lumen Status Dead end cap in place;Heparin locked  Purple Lumen Status N/A  Catheter fill solution Heparin 1000 units/ml  Catheter fill volume (Arterial) 2.1 cc  Catheter fill volume (Venous) 2.1  Dressing Type Transparent  Dressing Status Antimicrobial disc in place;Clean, Dry, Intact  Interventions Other (Comment)  Drainage Description None  Dressing Change Due 05/13/22  Post treatment catheter status Capped and  Clamped    Devion Chriscoe [facility]

## 2022-05-12 NOTE — Progress Notes (Signed)
[facility] Progress Note   Subjective:   Seen in room - she is upset/tearful, for meeting this afternoon to work on discharge arrangements. Coming for HD this afternoon.  Objective Vitals:   05/11/22 1529 05/11/22 2126 05/12/22 0620 05/12/22 0834  BP: 138/67 (!) 120/30 125/60 (!) 148/73  Pulse: 61 60 (!) 59 68  Resp: 18 18 18 18   Temp: 97.9 F (36.6 C) (!) 97.5 F (36.4 C) 98.4 F (36.9 C) 98.3 F (36.8 C)  TempSrc: Oral Oral Oral Oral  SpO2: 100% 100% 100% 100%  Weight:      Height:       Physical Exam General: Obese, well appearing, tearful Heart: RRR Lungs: CTAB Abdomen: bandages to B lateral pannus wounds Extremities: B AKA Dialysis Access: [facility]  Additional Objective Labs: Basic Metabolic Panel: Recent Labs  Lab 05/06/22 0854 05/06/22 1349 05/09/22 1526  NA 136 133* 138  K 5.7* 3.4* 6.6*  CL 98 94* 100  CO2 26 26 26   GLUCOSE 81 124* 87  BUN 67* 21* 76*  CREATININE 9.64* 3.96* 10.65*  CALCIUM 9.8 8.7* 9.5  PHOS 6.8* 2.0* 6.3*   Liver Function Tests: Recent Labs  Lab 05/06/22 0854 05/06/22 1349 05/09/22 1526  ALBUMIN 3.4* 3.4* 3.2*   CBC: Recent Labs  Lab 05/06/22 0854 05/09/22 1527  WBC 7.2 8.1  HGB 8.7* 8.7*  HCT 30.3* 29.0*  MCV 95.3 92.9  PLT 214 223   Medications:  sodium thiosulfate 12.5 g in sodium chloride 0.9 % 150 mL Infusion for Calciphylaxis      apixaban  2.5 mg Oral BID   aspirin EC  81 mg Oral Daily   atorvastatin  10 mg Oral Daily   darbepoetin (ARANESP) injection - DIALYSIS  200 mcg Subcutaneous Q Tue-1800   docusate sodium  100 mg Oral BID   famotidine  20 mg Oral Daily   feeding supplement  1 Container Oral TID BM   fentaNYL  1 patch Transdermal Q72H   gabapentin  100 mg Oral QHS   Gerhardt's butt cream   Topical BID   heparin sodium (porcine)  4,200 Units Intracatheter Once   levothyroxine  100 mcg Oral Q0600   midodrine  10 mg Oral Once per day on Tue Thu Sat   pantoprazole  40 mg Oral Daily    pregabalin  50 mg Oral Daily   sevelamer carbonate  4.8 g Oral TID WC   sucroferric oxyhydroxide  500 mg Oral TID WC    Dialysis Orders: Establishing.  Assessment/Plan: Debility: Had been unable to tolerate dialysis in recliner and therefore unable to discharge to outpatient dialysis. She has lost significant weight this admit (> 50 lbs). Sacral decub ulcers remain a barrier, but now sitting in her powerchair with cushion for longer periods.  Appreciate efforts from PT team.   ESRD: Previously on TTS schedule -> last week dialyzed only Tues/Fri d/t constipation issues. For HD today and then likely continue MWF schedule this week if discharge is coming soon. 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 No calcium based products or vitamin D. Gets 1/2 dose NaThiosulfate 12.5g q HD - could not tolerate higher dose. HTN /volume: On midodrine 10mg  qHD, with 10mg  prn mid HD w/parameters to keep SBP >100 d/t becoming symptomatic when it drops lower. Significant weight loss this admit Severe ischial sacral decubitus wound: CT showing chronic osteomyelitis. Limiting her, noted to be slow healing. Goal to tolerate sitting upright - which she  is doing with PT.  COPD/Chronic O2 use: Prior O2 at home (5L/min) -> tapered to 2-3L/min here Anemia of ESRD: Hgb 8.7  - on max dose Aranesp 266mcg q Tues. Recent Fe load completed.,  Follow-up lab trend Secondary hyperparathyroidism: On two binders. CorrCa elevated. Not on VDRA, last PTH 86 (low) on 03/11/22 -> repeat PTH 124 (05/06/22). Continue to avoid all calcium based medications and Vit D. 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 Nutrition - Alb 3.2 on regular diet.  Would really prefer she be on a renal diet but she refuses. Disposition: Difficult situation. Prior to admit, patient required 6 staff members and 2 hoyer pads for transfer into HD chair. Could not stay on HD for full tx. 2/2 pain of  multiple decubitus ulcers and PTAR unable to lift pt into ambulance for transport. SW working on SNF. She now has new power chair and cushion. She has been sitting in the chair for longer periods over the past few weeks.   [name], PA-C 05/12/2022, 10:30 AM  [employer]

## 2022-05-13 DIAGNOSIS — E1169 Type 2 diabetes mellitus with other specified complication: Secondary | ICD-10-CM | POA: Diagnosis not present

## 2022-05-13 DIAGNOSIS — N186 End stage renal disease: Secondary | ICD-10-CM | POA: Diagnosis not present

## 2022-05-13 DIAGNOSIS — L89153 Pressure ulcer of sacral region, stage 3: Secondary | ICD-10-CM | POA: Diagnosis not present

## 2022-05-13 DIAGNOSIS — J9611 Chronic respiratory failure with hypoxia: Secondary | ICD-10-CM | POA: Diagnosis not present

## 2022-05-13 MED ORDER — SUCROFERRIC OXYHYDROXIDE 500 MG PO CHEW
1000.0000 mg | CHEWABLE_TABLET | Freq: Three times a day (TID) | ORAL | Status: DC
  Administered 2022-05-13 – 2022-05-15 (×5): 1000 mg via ORAL
  Filled 2022-05-13 (×6): qty 2

## 2022-05-13 NOTE — TOC Progression Note (Signed)
Transition of Care [facility]) - Initial/Assessment Note    Patient Details  Name: Courtney Gray MRN: [id number] Date of Birth: 01/05/1971  Transition of Care [facility]) CM/SW Contact:    Milinda Antis, National Phone Number: 05/13/2022, 8:53 AM  Clinical Narrative:                 LCSW met with the patient at bedside to receive facility choice.  The patient choose [facility].    TOC following.n  Expected Discharge Plan: Long Term Nursing Home Barriers to Discharge: No SNF bed, Transportation, Waiting for outpatient dialysis   Patient Goals and CMS Choice Patient states their goals for this hospitalization and ongoing recovery are:: To be at a facility close to her daughter          Expected Discharge Plan and Services       Living arrangements for the past 2 months: Single Family Home                                      Prior Living Arrangements/Services Living arrangements for the past 2 months: Single Family Home Lives with:: Adult Children Patient language and need for interpreter reviewed:: Yes        Need for Family Participation in Patient Care: Yes (Comment) Care giver support system in place?: No (comment)   Criminal Activity/Legal Involvement Pertinent to Current Situation/Hospitalization: No - Comment as needed  Activities of Daily Living Home Assistive Devices/Equipment: [employer] fast streamer ADL Screening (condition at time of admission) Patient's cognitive ability adequate to safely complete daily activities?: No Is the patient deaf or have difficulty hearing?: No Does the patient have difficulty seeing, even when wearing glasses/contacts?: No Does the patient have difficulty concentrating, remembering, or making decisions?: No Patient able to express need for assistance with ADLs?: Yes Does the patient have difficulty dressing or bathing?: Yes Independently performs ADLs?: No Does the patient have difficulty walking or climbing stairs?: Yes Weakness of Legs:  Both Weakness of Arms/Hands: Both  Permission Sought/Granted Permission sought to share information with : Other (comment) Permission granted to share information with : Yes, Verbal Permission Granted  Share Information with NAME: Pung,antoinette (Daughter)   [phone]  Permission granted to share info w AGENCY: SNF        Emotional Assessment Appearance:: Appears older than stated age Attitude/Demeanor/Rapport: Engaged Affect (typically observed): Tearful/Crying, Accepting Orientation: : Oriented to Situation, Oriented to  Time, Oriented to Place, Oriented to Self Alcohol / Substance Use: Not Applicable Psych Involvement: No (comment)  Admission diagnosis:  End stage renal disease ([place]) [N18.6] Fluid overload [E87.70] Obesity with serious comorbidity, unspecified classification, unspecified obesity type [E66.9] Pressure injury of right buttock, stage 2 ([place]) [L89.312] Patient Active Problem List   Diagnosis Date Noted   Nodule of skin of abdomen 02/16/2022   Acute on chronic systolic CHF (congestive heart failure) ([place]) 02/11/2022   Acute on chronic diastolic CHF (congestive heart failure) ([place]) 12/12/2021   PAD (peripheral artery disease) ([place]) 12/12/2021   Irritable bowel syndrome (IBS) 12/12/2021   Amputation of left middle finger 12/12/2021   GERD (gastroesophageal reflux disease) 12/12/2021   Hypertension    Type 2 diabetes mellitus with hyperlipidemia ([place])    Decubitus ulcer of sacral region, stage 3 ([place])    Class 3 obesity ([place])    Bacteremia    PVD (peripheral vascular disease) ([place])    Hepatic abscess  Pressure injury of skin 09/13/2021   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[place])    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   Lactic acidosis 09/10/2021   Mixed diabetic hyperlipidemia associated with type 2 diabetes mellitus ([place]) 09/10/2021   Coronary artery disease involving native coronary artery of native heart without angina pectoris 09/10/2021   Elevated  troponin level not due myocardial infarction 09/10/2021   Hypotension 09/10/2021   Dry gangrene (HCC)    Pain 08/03/2021   Ischemic foot pain at rest 08/03/2021   Hypoglycemia 07/10/2021   Volume overload 07/09/2021   Leukocytosis 06/11/2021   Acute gout 06/11/2021   Hyperkalemia 05/21/2021   Hemorrhoids    Hyperlipidemia 05/18/2021   Rectal bleeding 05/17/2021   Morbid obesity with BMI of 60.0-69.9, adult ([place]) 05/17/2021   NSTEMI (non-ST elevated myocardial infarction) ([place]) 05/16/2021   Essential hypertension 09/01/2020   End stage renal disease ([place]) 09/01/2020   Chronic respiratory failure with hypoxia ([place]) 09/01/2020   Hypertensive emergency    Hypertensive urgency 11/28/2019   Uncontrolled type 2 diabetes mellitus with hypoglycemia, with long-term current use of insulin ([place]) 11/28/2019   Anemia due to chronic kidney disease 11/28/2019   PCP:  Monico Blitz, MD Pharmacy:   [facility], [place] [address] [place] [address] Phone: [phone] Fax: [phone]  [facility] [address] [place] [address] Phone: [phone] Fax: [phone]  [place], [place] - [address] [place] [address] Phone: [phone] Fax: [phone]     Social Determinants of Health (SDOH) Social History: [place]: No Food Insecurity (12/11/2021)  Housing: Low Risk  (12/11/2021)  Transportation Needs: No Transportation Needs (12/11/2021)  Utilities: At Risk (12/11/2021)  Tobacco Use: Low Risk  (03/07/2022)   SDOH Interventions:     Readmission Risk Interventions    09/16/2021    4:36 PM 08/28/2021   11:50 AM 07/12/2021   12:38 PM  Readmission Risk Prevention Plan  Transportation Screening Complete Complete Complete  Medication Review Press photographer)  Complete Complete  PCP or Specialist appointment within 3-5 days of  discharge  Complete Complete  HRI or Home Care Consult Complete Complete Complete  SW Recovery Care/Counseling Consult Complete Complete Complete  Palliative Care Screening  Not Applicable Not Applicable  Skilled Nursing Facility Complete Not Applicable Complete

## 2022-05-13 NOTE — Progress Notes (Signed)
Contacted [facility] SW today to discuss pt's resumption at d/c. Pt's case continues to be discussed. Clinic has several bariatric HD chairs that will accommodate up to 450 lbs. Pt will be able to use one of these chairs. Clinic also stating that snf needs to be able to provide an appropriate hoyer pad/sling in order for pt to transfer from w/c to HD chair. Clinic is also concerned that pt's snf will drop pt off for treatment several hours early when another snf pt needs to arrive for their treatment. Clinic is requesting that snf be contacted to see if pt will arrive close to her chair time to alleviate pt having to arrive and wait for her chair time later in the day. Discussed with CSW who has contacted snf to inquire what time snf would transport pt to clinic if pt's chair time is MWF 1:00. Awaiting snf response.    Melven Sartorius Renal Navigator [phone]

## 2022-05-13 NOTE — Progress Notes (Signed)
PT Cancellation Note  Patient Details Name: Courtney Gray MRN: [id number] DOB: 16-Dec-1970   Cancelled Treatment:    Reason Eval/Treat Not Completed: Patient declined, no reason specified  States she did not sleep well and left flank is painful. May be willing to work with PT this afternoon. Discussed PT role for d/c planning and coordinating with Corene Cornea from [facility] for powerchair measurements Thursday at 9:30am. Will attempt to follow-up this afternoon as schedule allows.  Ellouise Newer 05/13/2022, 10:46 AM

## 2022-05-13 NOTE — Plan of Care (Signed)
  Problem: Activity: Goal: Risk for activity intolerance will decrease Outcome: Progressing   Problem: Coping: Goal: Level of anxiety will decrease Outcome: Not Progressing   

## 2022-05-13 NOTE — Progress Notes (Signed)
[facility] Progress Note   Subjective:  Seen in room - no CP/dyspnea. Per SW notes - patient has selected [facility] on discharge - awaiting final HD arrangements, btu possible discharge on Thursday.  Objective Vitals:   05/12/22 1811 05/12/22 2112 05/13/22 0604 05/13/22 0850  BP: (!) 146/47 (!) 135/43 (!) 129/59 126/77  Pulse: (!) 52 (!) 54 (!) 59 61  Resp: 14 18 18 16   Temp: 98.1 F (36.7 C) 98.2 F (36.8 C) 98.6 F (37 C) 98 F (36.7 C)  TempSrc: Oral Oral Oral Oral  SpO2: 99% 100% 100% 100%  Weight:      Height:       Physical Exam General: Obese, well appearing, tNAD. Heart: RRR Lungs: CTAB Abdomen: bandages to B lateral pannus wounds Extremities: B AKA Dialysis Access: [facility]  Additional Objective Labs: Basic Metabolic Panel: Recent Labs  Lab 05/06/22 1349 05/09/22 1526 05/12/22 1330  NA 133* 138 139  K 3.4* 6.6* 6.3*  CL 94* 100 98  CO2 26 26 25   GLUCOSE 124* 87 121*  BUN 21* 76* 72*  CREATININE 3.96* 10.65* 10.49*  CALCIUM 8.7* 9.5 9.6  PHOS 2.0* 6.3* 6.8*   Liver Function Tests: Recent Labs  Lab 05/06/22 1349 05/09/22 1526 05/12/22 1330  ALBUMIN 3.4* 3.2* 3.3*   CBC: Recent Labs  Lab 05/09/22 1527 05/12/22 1330  WBC 8.1 7.4  HGB 8.7* 8.4*  HCT 29.0* 28.5*  MCV 92.9 94.4  PLT 223 200   Blood Culture    Component Value Date/Time   SDES BLOOD LEFT ANTECUBITAL 10/16/2021 1534   SPECREQUEST  10/16/2021 1534    BOTTLES DRAWN AEROBIC AND ANAEROBIC Blood Culture results may not be optimal due to an inadequate volume of blood received in culture bottles   CULT  10/16/2021 1534    NO GROWTH 5 DAYS Performed at [facility], [place] [address], [place], [place] [address]    REPTSTATUS 10/21/2021 FINAL 10/16/2021 1534   Medications:  sodium thiosulfate 12.5 g in sodium chloride 0.9 % 150 mL Infusion for Calciphylaxis Stopped (05/12/22 1711)    apixaban  2.5 mg Oral BID   aspirin EC  81 mg Oral Daily   atorvastatin   10 mg Oral Daily   darbepoetin (ARANESP) injection - DIALYSIS  200 mcg Subcutaneous Q Tue-1800   docusate sodium  100 mg Oral BID   famotidine  20 mg Oral Daily   feeding supplement  1 Container Oral TID BM   fentaNYL  1 patch Transdermal Q72H   gabapentin  100 mg Oral QHS   Gerhardt's butt cream   Topical BID   heparin sodium (porcine)  4,200 Units Intracatheter Once   levothyroxine  100 mcg Oral Q0600   midodrine  10 mg Oral Once per day on Mon Wed Fri   pantoprazole  40 mg Oral Daily   pregabalin  50 mg Oral Daily   sevelamer carbonate  4.8 g Oral TID WC   sucroferric oxyhydroxide  500 mg Oral TID WC    Dialysis Orders: Establishing -> still AF patient per notes, awaiting schedule.  Assessment/Plan: Debility: Had been unable to tolerate dialysis in recliner and therefore unable to discharge to outpatient dialysis. She has lost significant weight this admit (> 50 lbs). Sacral decub ulcers remain a barrier, but now sitting in her powerchair with cushion for longer periods.  Appreciate efforts from PT team.   ESRD: Previously on TTS schedule -> last week dialyzed only Tues/Fri d/t  constipation issues. Changed to MWF this week - next HD tomorrow.  Having hyperkalemia pre-HD x 2 -> may need to add back Lokelma. 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 No calcium based products or vitamin D. Gets 1/2 dose NaThiosulfate 12.5g q HD - could not tolerate higher dose. HTN /volume: On midodrine 10mg  qHD, with 10mg  prn mid HD w/parameters to keep SBP >100 d/t becoming symptomatic when it drops lower. Significant weight loss this admit Severe ischial sacral decubitus wound: CT showing chronic osteomyelitis. Limiting her, noted to be slow healing. Goal to tolerate sitting upright - which she is doing with PT.  COPD/Chronic O2 use: Prior O2 at home (5L/min) -> tapered to 2-3L/min here Anemia of ESRD: Hgb 8.4  - on max dose Aranesp 287mcg q Tues. Recent Fe load  completed.,  Follow-up lab trend Secondary hyperparathyroidism: On Renvela pwdr 4.8g TID and Velphoro 1/meals -> will increase to 2/meals. CorrCa elevated. Not on VDRA, last PTH 86 (low) on 03/11/22 -> repeat PTH 124 (05/06/22). Continue to avoid all calcium based medications and Vit D. 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 Nutrition - Alb 3.3 on regular diet.  Would really prefer she be on a renal diet but she refuses. Disposition: Difficult situation. Prior to admit, patient required 6 staff members and 2 hoyer pads for transfer into HD chair. Could not stay on HD for full tx. 2/2 pain of multiple decubitus ulcers and PTAR unable to lift pt into ambulance for transport. SW working on SNF. She now has new power chair and cushion. She has been sitting in the chair for longer periods over the past few weeks.  [name], PA-C 05/13/2022, 9:47 AM  [employer]

## 2022-05-13 NOTE — Progress Notes (Signed)
PROGRESS NOTE    Carolynne Schuchard  YNW:[id number] DOB: 05-15-1970 DOA: 11/01/2021 PCP: Monico Blitz, MD    Brief Narrative:   Courtney Gray is a 52 y.o. female with past medical history significant for ESRD opn HD TTS, chronic diastolic congestive heart failure, PVD s/p bilateral AKA, type 2 diabetes mellitus, morbid obesity, chronic sacral wound, recent hospitalization for bacteremia with hepatic/splenic abscess on long-term IV antibiotics who presented to [facility] ED on 7/28 with shortness of breath in the setting of missed hemodialysis and admitted for hypertensive emergency.  Patient with long length of stay, discharge pending due to ability to obtain outpatient hemodialysis slot that can accommodate patient's mobility constraints secondary to decubitus ulcer wound and bilateral AKA.  Patient will need to be able to sit HD chair outpatient to get outpatient HD set up.  Hospital HD unit working on getting bariatric HD chair to accommodate patient.  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  She has declined antibiotics due to GI issues and will continue with wound care.  Doxycycline was discontinued on 03/07/2022. --Continue local wound care, offloading -- Fentanyl patch, oxycodone as needed  Calciphylaxis/abdominal wound: -- Continue wound care, oxycodone as needed  Hypotension during HD -- Continue midodrine on HD days  Phantom limb pain Peripheral neuropathy -- Lyrica, gabapentin  Hyperkalemia -- Electrolyte management with HD  Peripheral vascular disease/peripheral arterial disease Follows with vascular surgery  outpatient, s/p bilateral AKA and amputation left middle finger. -- Continue aspirin, statin, Eliquis  Decubitus ulcer, sacral region, stage III, right hip, left flank Pressure Injury 11/12/21 Hip Right Unstageable - Full thickness tissue loss in which the base of the injury is covered by slough (yellow, tan, gray, green or brown) and/or eschar (tan, brown or black) in the wound bed. 90% covered with slough (Active)  11/12/21 0800  Location: Hip  Location Orientation: Right  Staging: Unstageable - Full thickness tissue loss in which the base of the injury is covered by slough (yellow, tan, gray, green or brown) and/or eschar (tan, brown or black) in the wound bed.  Wound Description (Comments): 90% covered with slough  Present on Admission: Yes (per RN)     Pressure Injury 11/24/21 Flank Left;Lower Stage 2 -  Partial thickness loss of dermis presenting as a shallow open injury with a red, pink wound bed without slough. (Active)  11/24/21 0430  Location: Flank  Location Orientation: Left;Lower  Staging: Stage 2 -  Partial thickness loss of dermis presenting as a shallow open injury with a red, pink wound bed without slough.  Wound Description (Comments):   Present on Admission: No  -- Continue local wound care, offloading  Hypothyroidism TSH 3.935 on 06/11/2021. --Levothyroxine 100 mcg p.o. daily  Type 2 diabetes mellitus Hemoglobin A1c 5.3 on 12/19/2021, diet controlled.  Hyperlipidemia -- Atorvastatin 10 mg p.o. daily  Chronic respiratory failure secondary to OHS -- Supplemental oxygen to maintain SpO2 greater than 92%  Constipation -- MiraLAX daily  Stye/blepharitis OS Completed 5-day course of azithromycin and 10-day course of tobramycin. -- Artificial tears PRN  Morbid obesity Body mass index is 42.63 kg/m.  Discussed with patient needs for aggressive lifestyle changes/weight loss as this complicates  all facets of care.  Outpatient follow-up with PCP.     DVT  prophylaxis: apixaban (ELIQUIS) tablet 2.5 mg Start: 04/28/22 1100 apixaban (ELIQUIS) tablet 2.5 mg    Code Status: Full Code Family Communication: No family present at bedside this morning  Disposition Plan:  Level of care: Med-Surg Status is: Inpatient Remains inpatient appropriate because: Medically stable for discharge once outpatient HD seat obtained and SNF bed     Consultants:  Nephrology Infectious disease - signed off 12/1 IR for HD tunneled catheter exchange Vascular surgery Cardiology Orthopedics hand surgery,  Procedures:  TEE Left middle finger amputation 6/19  Antimicrobials:  Azithromycin 1/27 - 1/31 Vancomycin 8/22, 8/24, 10/31,12/1 Doxycycline 11/29 - 11/30 Cefepime 7/28, 7/29, 7/31, 8/2 Metronidazole 7/28 - 8/4   Subjective: Patient seen examined bedside, resting comfortably.  Sitting up in bed.  Complaining of pain to her abdominal wound.  Reports she only had a small bowel movement this morning.  The patient has elected to go to [facility], TOC to work on Ship broker.  Discussed with patient plan discharge after power wheelchair evaluation by vendor planned for Thursday.  Nephrology working on finalization of outpatient dialysis.  Patient asked "is this your last day of the week".  No other complaints or concerns at this time. Denies headache, no dizziness, no chest pain, no shortness of breath, no abdominal pain.  No acute events overnight per nursing staff.  Medically stable for discharge once outpatient HD bed available.  Objective: Vitals:   05/12/22 1811 05/12/22 2112 05/13/22 0604 05/13/22 0850  BP: (!) 146/47 (!) 135/43 (!) 129/59 126/77  Pulse: (!) 52 (!) 54 (!) 59 61  Resp: 14 18 18 16   Temp: 98.1 F (36.7 C) 98.2 F (36.8 C) 98.6 F (37 C) 98 F (36.7 C)  TempSrc: Oral Oral Oral Oral  SpO2: 99% 100% 100% 100%  Weight:      Height:        Intake/Output Summary (Last 24 hours) at 05/13/2022 1017 Last data filed at  05/12/2022 2305 Gross per 24 hour  Intake 269.45 ml  Output 2050 ml  Net -1780.55 ml   Filed Weights   05/09/22 1510 05/12/22 1316 05/12/22 1709  Weight: 119.2 kg 118.6 kg 116.2 kg    Examination:  Physical Exam: GEN: NAD, alert and oriented x 3, obese HEENT: NCAT, PERRL, EOMI, sclera clear, MMM PULM: CTAB w/o wheezes/crackles, normal respiratory effort, on 2 L nasal cannula CV: RRR w/o M/G/R GI: abd soft, NTND, NABS, no R/G/M MSK: Noted bilateral AKA, left middle finger amputation, moves upper extremities independently. NEURO: No focal neurological deficits PSYCH: normal mood/affect Integumentary: Sacral wound noted, abdominal wound noted with dressing in place    Data Reviewed: I have personally reviewed following labs and imaging studies  CBC: Recent Labs  Lab 05/09/22 1527 05/12/22 1330  WBC 8.1 7.4  HGB 8.7* 8.4*  HCT 29.0* 28.5*  MCV 92.9 94.4  PLT 223 951   Basic Metabolic Panel: Recent Labs  Lab 05/06/22 1349 05/09/22 1526 05/12/22 1330  NA 133* 138 139  K 3.4* 6.6* 6.3*  CL 94* 100 98  CO2 26 26 25   GLUCOSE 124* 87 121*  BUN 21* 76* 72*  CREATININE 3.96* 10.65* 10.49*  CALCIUM 8.7* 9.5 9.6  PHOS 2.0* 6.3* 6.8*   GFR: Estimated Creatinine Clearance: 8 mL/min (A) (by C-G formula based on SCr of 10.49 mg/dL (H)). Liver Function Tests: Recent Labs  Lab 05/06/22 1349 05/09/22 1526 05/12/22 1330  ALBUMIN 3.4* 3.2* 3.3*   No results for input(s): "LIPASE", "AMYLASE" in the last 168 hours. No results for input(s): "AMMONIA" in the last 168 hours. Coagulation Profile: No results for input(s): "INR", "PROTIME" in the last 168 hours. Cardiac Enzymes: No results for input(s): "CKTOTAL", "CKMB", "CKMBINDEX", "TROPONINI" in the last 168 hours. BNP (last 3 results) No results for input(s): "PROBNP" in the last 8760 hours. HbA1C: No results for input(s): "HGBA1C" in the last 72 hours. CBG: No results for input(s): "GLUCAP" in the last 168  hours. Lipid Profile: No results for input(s): "CHOL", "HDL", "LDLCALC", "TRIG", "CHOLHDL", "LDLDIRECT" in the last 72 hours. Thyroid Function Tests: No results for input(s): "TSH", "T4TOTAL", "FREET4", "T3FREE", "THYROIDAB" in the last 72 hours. Anemia Panel: No results for input(s): "VITAMINB12", "FOLATE", "FERRITIN", "TIBC", "IRON", "RETICCTPCT" in the last 72 hours. Sepsis Labs: No results for input(s): "PROCALCITON", "LATICACIDVEN" in the last 168 hours.  No results found for this or any previous visit (from the past 240 hour(s)).       Radiology Studies: No results found.      Scheduled Meds:  apixaban  2.5 mg Oral BID   aspirin EC  81 mg Oral Daily   atorvastatin  10 mg Oral Daily   darbepoetin (ARANESP) injection - DIALYSIS  200 mcg Subcutaneous Q Tue-1800   docusate sodium  100 mg Oral BID   famotidine  20 mg Oral Daily   feeding supplement  1 Container Oral TID BM   fentaNYL  1 patch Transdermal Q72H   gabapentin  100 mg Oral QHS   Gerhardt's butt cream   Topical BID   heparin sodium (porcine)  4,200 Units Intracatheter Once   levothyroxine  100 mcg Oral Q0600   midodrine  10 mg Oral Once per day on Mon Wed Fri   pantoprazole  40 mg Oral Daily   pregabalin  50 mg Oral Daily   sevelamer carbonate  4.8 g Oral TID WC   sucroferric oxyhydroxide  1,000 mg Oral TID WC   Continuous Infusions:  sodium thiosulfate 12.5 g in sodium chloride 0.9 % 150 mL Infusion for Calciphylaxis Stopped (05/12/22 1711)     LOS: 193 days    Time spent: 48 minutes spent on chart review, discussion with nursing staff, consultants, updating family and interview/physical exam; more than 50% of that time was spent in counseling and/or coordination of care.    Adysen Raphael J [place] DO [facility] Available via Epic secure chat 7am-7pm After these hours, please refer to coverage provider listed on [email or web address] 05/13/2022, 10:17 AM

## 2022-05-14 DIAGNOSIS — E1169 Type 2 diabetes mellitus with other specified complication: Secondary | ICD-10-CM | POA: Diagnosis not present

## 2022-05-14 DIAGNOSIS — R7881 Bacteremia: Secondary | ICD-10-CM | POA: Diagnosis not present

## 2022-05-14 DIAGNOSIS — N186 End stage renal disease: Secondary | ICD-10-CM | POA: Diagnosis not present

## 2022-05-14 DIAGNOSIS — I10 Essential (primary) hypertension: Secondary | ICD-10-CM | POA: Diagnosis not present

## 2022-05-14 LAB — RENAL FUNCTION PANEL
Albumin: 3.1 g/dL — ABNORMAL LOW (ref 3.5–5.0)
Anion gap: 17 — ABNORMAL HIGH (ref 5–15)
BUN: 57 mg/dL — ABNORMAL HIGH (ref 6–20)
CO2: 26 mmol/L (ref 22–32)
Calcium: 9.6 mg/dL (ref 8.9–10.3)
Chloride: 96 mmol/L — ABNORMAL LOW (ref 98–111)
Creatinine, Ser: 9.04 mg/dL — ABNORMAL HIGH (ref 0.44–1.00)
GFR, Estimated: 5 mL/min — ABNORMAL LOW (ref >60)
Glucose, Bld: 86 mg/dL (ref 70–99)
Phosphorus: 6.6 mg/dL — ABNORMAL HIGH (ref 2.5–4.6)
Potassium: 6.2 mmol/L — ABNORMAL HIGH (ref 3.5–5.1)
Sodium: 139 mmol/L (ref 135–145)

## 2022-05-14 MED ORDER — OXYCODONE HCL 5 MG PO TABS
5.0000 mg | ORAL_TABLET | ORAL | Status: DC | PRN
  Administered 2022-05-14 – 2022-05-15 (×3): 5 mg via ORAL
  Filled 2022-05-14: qty 2
  Filled 2022-05-14 (×3): qty 1

## 2022-05-14 MED ORDER — HEPARIN SODIUM (PORCINE) 1000 UNIT/ML DIALYSIS
20.0000 [IU]/kg | INTRAMUSCULAR | Status: DC | PRN
  Administered 2022-05-14: 2300 [IU] via INTRAVENOUS_CENTRAL
  Filled 2022-05-14: qty 3

## 2022-05-14 MED ORDER — HEPARIN SODIUM (PORCINE) 1000 UNIT/ML IJ SOLN
INTRAMUSCULAR | Status: AC
  Filled 2022-05-14: qty 5

## 2022-05-14 NOTE — Progress Notes (Signed)
[place] duration:3.30  Patient tolerated well.  Transported back to the room  Alert, without acute distress.  Hand-off given to patient's nurse.   Access used: catheter Access issues: n/a  Total UF removed: 3L Medication(s) given: Oxy Post HD weight: 116.8kg     05/14/22 1737  Vitals  Temp 98.2 F (36.8 C)  Temp Source Oral  BP 134/69  MAP (mmHg) 81  BP Location Right Wrist  BP Method Automatic  Patient Position (if appropriate) Lying  Pulse Rate 60  Pulse Rate Source Monitor  Oxygen Therapy  SpO2 100 %  O2 Device Room Air  O2 Flow Rate (L/min) 2 L/min  During Treatment Monitoring  Blood Flow Rate (mL/min) 350 mL/min  Arterial Pressure (mmHg) -100 mmHg  Venous Pressure (mmHg) 200 mmHg  TMP (mmHg) 24 mmHg  Ultrafiltration Rate (mL/min) 1026 mL/min  Dialysate Flow Rate (mL/min) 300 ml/min  HD Safety Checks Performed Yes  Intra-Hemodialysis Comments Tx completed  Post Treatment  Dialyzer Clearance Lightly streaked  Duration of HD Treatment -hour(s) 3.3 hour(s)  Hemodialysis Intake (mL) 0 mL  Liters Processed 72.5  Fluid Removed (mL) 3000 mL  Tolerated HD Treatment Yes  Hemodialysis Catheter Left Internal jugular Double lumen Permanent (Tunneled)  Placement Date/Time: 03/07/22 1105   Serial / Lot #: [id number]  Expiration Date: 09/09/26  Time Out: Correct patient;Correct site;Correct procedure  Maximum sterile barrier precautions: Hand hygiene;Cap;Mask;Sterile gown;Sterile gloves;Large sterile ...  Site Condition No complications  Blue Lumen Status Heparin locked  Red Lumen Status Heparin locked  Catheter fill solution Heparin 1000 units/ml  Catheter fill volume (Arterial) 2.1 cc  Catheter fill volume (Venous) 2.1  Dressing Type Transparent  Dressing Status Antimicrobial disc in place  Dressing Change Due 05/20/22  Post treatment catheter status Capped and [facility]

## 2022-05-14 NOTE — Progress Notes (Signed)
[facility] Progress Note   Subjective:  Seen in room - looks stable. No CP/dyspnea. For HD today.  Objective Vitals:   05/13/22 1709 05/13/22 2058 05/14/22 0511 05/14/22 0945  BP: 139/69 131/70 137/64 139/88  Pulse: (!) 58 (!) 56 (!) 54 62  Resp: 19 18 18 18   Temp: 98.1 F (36.7 C) 98.3 F (36.8 C) 98 F (36.7 C) 97.7 F (36.5 C)  TempSrc: Oral Oral Oral Oral  SpO2: 99% 100% 100% 100%  Weight:      Height:       Physical Exam General: Obese, well appearing, tNAD. Heart: RRR Lungs: CTAB Abdomen: bandages to B lateral pannus wounds Extremities: B AKA Dialysis Access: [facility]  Additional Objective Labs: Basic Metabolic Panel: Recent Labs  Lab 05/09/22 1526 05/12/22 1330  NA 138 139  K 6.6* 6.3*  CL 100 98  CO2 26 25  GLUCOSE 87 121*  BUN 76* 72*  CREATININE 10.65* 10.49*  CALCIUM 9.5 9.6  PHOS 6.3* 6.8*   Liver Function Tests: Recent Labs  Lab 05/09/22 1526 05/12/22 1330  ALBUMIN 3.2* 3.3*   CBC: Recent Labs  Lab 05/09/22 1527 05/12/22 1330  WBC 8.1 7.4  HGB 8.7* 8.4*  HCT 29.0* 28.5*  MCV 92.9 94.4  PLT 223 200   Medications:  sodium thiosulfate 12.5 g in sodium chloride 0.9 % 150 mL Infusion for Calciphylaxis Stopped (05/12/22 1711)    apixaban  2.5 mg Oral BID   aspirin EC  81 mg Oral Daily   atorvastatin  10 mg Oral Daily   darbepoetin (ARANESP) injection - DIALYSIS  200 mcg Subcutaneous Q Tue-1800   docusate sodium  100 mg Oral BID   famotidine  20 mg Oral Daily   feeding supplement  1 Container Oral TID BM   fentaNYL  1 patch Transdermal Q72H   gabapentin  100 mg Oral QHS   Gerhardt's butt cream   Topical BID   heparin sodium (porcine)  4,200 Units Intracatheter Once   levothyroxine  100 mcg Oral Q0600   midodrine  10 mg Oral Once per day on Mon Wed Fri   pantoprazole  40 mg Oral Daily   pregabalin  50 mg Oral Daily   sevelamer carbonate  4.8 g Oral TID WC   sucroferric oxyhydroxide  1,000 mg Oral TID WC     Dialysis Orders: Establishing -> still AF patient per notes, looks like will be MWF schedule.   Assessment/Plan: Debility: Had been unable to tolerate dialysis in recliner and therefore unable to discharge to outpatient dialysis. She has lost significant weight this admit (> 50 lbs). Sacral decub ulcers remain a barrier, but now sitting in her powerchair with cushion for longer periods.  Appreciate efforts from PT team.   ESRD: Previously on TTS schedule -> last week dialyzed only Tues/Fri d/t constipation issues. Changed to MWF this week - HD today. Having hyperkalemia pre-HD x 2 -> may need to add back Lokelma. 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 No calcium based products or vitamin D. Gets 1/2 dose NaThiosulfate 12.5g q HD - could not tolerate higher dose. HTN /volume: On midodrine 10mg  qHD, with 10mg  prn mid HD w/parameters to keep SBP >100 d/t becoming symptomatic when it drops lower. Significant weight loss this admit Severe ischial sacral decubitus wound: CT showing chronic osteomyelitis. Limiting her, noted to be slow healing. Goal to tolerate sitting upright - which she is doing with PT.  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 Recent Fe load completed. Follow-up lab trend. Secondary hyperparathyroidism: On Renvela pwdr 4.8g TID and Velphoro 1/meals -> increased to 2/meals. CorrCa elevated. Not on VDRA, last PTH 86 (low) on 03/11/22 -> repeat PTH 124 (05/06/22). Continue to avoid all calcium based medications and Vit D. 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 Nutrition - Alb 3.3 on regular diet.  Would really prefer she be on a renal diet but she refuses. Disposition: Difficult situation. Prior to admit, patient required 6 staff members and 2 hoyer pads for transfer into HD chair. Could not stay on HD for full tx. 2/2 pain  of multiple decubitus ulcers and PTAR unable to lift pt into ambulance for transport. SW working on SNF. She now has new power chair and cushion and she has been sitting in the chair for longer periods over the past few weeks. Tentative plan to discharge to SNF [facility]) later this week.  [name], PA-C 05/14/2022, 10:04 AM  [employer]

## 2022-05-14 NOTE — TOC Progression Note (Addendum)
Transition of Care [facility]) - Initial/Assessment Note    Patient Details  Name: Courtney Gray MRN: [id number] Date of Birth: February 20, 1971  Transition [facility]) CM/SW Contact:    Milinda Antis, LCSWA Phone Number: 05/14/2022, 8:21 AM  Clinical Narrative:                 08:15- LCSW requested that [facility] SWAs request insurance authorization.   08:30-  LCSW contacted [employer] and spoke with Levada Dy, Chief Executive Officer.  Levada Dy verified that the patient will be transported to dialysis and picked up at the scheduled time and will not have to sit in the waiting room to wait for another patient to complete HD.  Renal navigator updated.  09:00-  Insurance auth received from 05/15/2022 -05/19/2022 and the [facility] ID number is [id number].  LCSW contacted Christine with central intake for the facility and left a secure voicemail informing them of the approval.   TOC following.  Expected Discharge Plan: Long Term Nursing Home Barriers to Discharge: No SNF bed, Transportation, Waiting for outpatient dialysis   Patient Goals and CMS Choice Patient states their goals for this hospitalization and ongoing recovery are:: To be at a facility close to her daughter          Expected Discharge Plan and Services       Living arrangements for the past 2 months: Single Family Home                                      Prior Living Arrangements/Services Living arrangements for the past 2 months: Single Family Home Lives with:: Adult Children Patient language and need for interpreter reviewed:: Yes        Need for Family Participation in Patient Care: Yes (Comment) Care giver support system in place?: No (comment)   Criminal Activity/Legal Involvement Pertinent to Current Situation/Hospitalization: No - Comment as needed  Activities of Daily Living Home Assistive Devices/Equipment: [employer] fast streamer ADL Screening (condition at time of admission) Patient's cognitive ability adequate  to safely complete daily activities?: No Is the patient deaf or have difficulty hearing?: No Does the patient have difficulty seeing, even when wearing glasses/contacts?: No Does the patient have difficulty concentrating, remembering, or making decisions?: No Patient able to express need for assistance with ADLs?: Yes Does the patient have difficulty dressing or bathing?: Yes Independently performs ADLs?: No Does the patient have difficulty walking or climbing stairs?: Yes Weakness of Legs: Both Weakness of Arms/Hands: Both  Permission Sought/Granted Permission sought to share information with : Other (comment) Permission granted to share information with : Yes, Verbal Permission Granted  Share Information with NAME: Dzikowski,antoinette (Daughter)   [phone]  Permission granted to share info w AGENCY: SNF        Emotional Assessment Appearance:: Appears older than stated age Attitude/Demeanor/Rapport: Engaged Affect (typically observed): Tearful/Crying, Accepting Orientation: : Oriented to Situation, Oriented to  Time, Oriented to Place, Oriented to Self Alcohol / Substance Use: Not Applicable Psych Involvement: No (comment)  Admission diagnosis:  End stage renal disease ([place]) [N18.6] Fluid overload [E87.70] Obesity with serious comorbidity, unspecified classification, unspecified obesity type [E66.9] Pressure injury of right buttock, stage 2 ([place]) [L89.312] Patient Active Problem List   Diagnosis Date Noted   Nodule of skin of abdomen 02/16/2022   Acute on chronic systolic CHF (congestive heart failure) ([place]) 02/11/2022   Acute on chronic diastolic CHF (congestive heart  failure) ([place]) 12/12/2021   PAD (peripheral artery disease) ([place]) 12/12/2021   Irritable bowel syndrome (IBS) 12/12/2021   Amputation of left middle finger 12/12/2021   GERD (gastroesophageal reflux disease) 12/12/2021   Hypertension    Type 2 diabetes mellitus with hyperlipidemia (HCC)    Decubitus ulcer  of sacral region, stage 3 (HCC)    Class 3 obesity (HCC)    Bacteremia    PVD (peripheral vascular disease) (HCC)    Hepatic abscess    Pressure injury of skin 09/13/2021   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[place])    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   Lactic acidosis 09/10/2021   Mixed diabetic hyperlipidemia associated with type 2 diabetes mellitus ([place]) 09/10/2021   Coronary artery disease involving native coronary artery of native heart without angina pectoris 09/10/2021   Elevated troponin level not due myocardial infarction 09/10/2021   Hypotension 09/10/2021   Dry gangrene (HCC)    Pain 08/03/2021   Ischemic foot pain at rest 08/03/2021   Hypoglycemia 07/10/2021   Volume overload 07/09/2021   Leukocytosis 06/11/2021   Acute gout 06/11/2021   Hyperkalemia 05/21/2021   Hemorrhoids    Hyperlipidemia 05/18/2021   Rectal bleeding 05/17/2021   Morbid obesity with BMI of 60.0-69.9, adult ([place]) 05/17/2021   NSTEMI (non-ST elevated myocardial infarction) ([place]) 05/16/2021   Essential hypertension 09/01/2020   End stage renal disease ([place]) 09/01/2020   Chronic respiratory failure with hypoxia ([place]) 09/01/2020   Hypertensive emergency    Hypertensive urgency 11/28/2019   Uncontrolled type 2 diabetes mellitus with hypoglycemia, with long-term current use of insulin ([place]) 11/28/2019   Anemia due to chronic kidney disease 11/28/2019   PCP:  Monico Blitz, MD Pharmacy:   [place], [place] [address] [place] [address] Phone: [phone] Fax: [phone]  [facility] [address] [address] Phone: [phone] Fax: [phone]  [place], [place] - [address] [place] [address] Phone: [phone] Fax: [phone]     Social Determinants of Health (SDOH) Social History: [place]: No Food Insecurity  (12/11/2021)  Housing: Low Risk  (12/11/2021)  Transportation Needs: No Transportation Needs (12/11/2021)  Utilities: At Risk (12/11/2021)  Tobacco Use: Low Risk  (03/07/2022)   SDOH Interventions:     Readmission Risk Interventions    09/16/2021    4:36 PM 08/28/2021   11:50 AM 07/12/2021   12:38 PM  Readmission Risk Prevention Plan  Transportation Screening Complete Complete Complete  Medication Review Press photographer)  Complete Complete  PCP or Specialist appointment within 3-5 days of discharge  Complete Complete  HRI or Home Care Consult Complete Complete Complete  SW Recovery Care/Counseling Consult Complete Complete Complete  Palliative Care Screening  Not Applicable Not Applicable  Skilled Nursing Facility Complete Not Applicable Complete

## 2022-05-14 NOTE — Plan of Care (Signed)
  Problem: Health Behavior/Discharge Planning: Goal: Ability to manage health-related needs will improve Outcome: Progressing   Problem: Activity: Goal: Risk for activity intolerance will decrease Outcome: Progressing   Problem: Coping: Goal: Level of anxiety will decrease Outcome: Progressing   Problem: Pain Managment: Goal: General experience of comfort will improve Outcome: Progressing   Problem: Skin Integrity: Goal: Risk for impaired skin integrity will decrease Outcome: Progressing

## 2022-05-14 NOTE — Progress Notes (Signed)
CSW contacted snf this am and they report that they will transport pt to HD clinic in order for pt to arrive by 12:40 for 1:00 chair time. SNF states they will not transport pt to HD clinic in the morning when another snf resident has HD. This info was provided to [facility] SW clinic Freight forwarder. Clinic advised pt will arrive by w/c transport in specialized w/c that pt is familiar with and knows how to operate. Clinic requesting that pt arrive with an appropriate hoyer pad/sling from snf so pt can transfer from w/c to HD chair. This request provided to CSW to provide to snf. Clinic advised of plan for pt to d/c tomorrow and resume care at clinic on Friday. Update provided to renal PA and nephrologist as well.   Melven Sartorius Renal Navigator [phone]

## 2022-05-14 NOTE — Progress Notes (Signed)
PROGRESS NOTE    Courtney Gray  YTK:[id number] DOB: 1970-04-26 DOA: 11/01/2021 PCP: Monico Blitz, MD    Brief Narrative:   Courtney Gray is a 52 y.o. female with past medical history significant for ESRD opn HD TTS, chronic diastolic congestive heart failure, PVD s/p bilateral AKA, type 2 diabetes mellitus, morbid obesity, chronic sacral wound, recent hospitalization for bacteremia with hepatic/splenic abscess on long-term IV antibiotics who presented to [facility] ED on 7/28 with shortness of breath in the setting of missed hemodialysis and admitted for hypertensive emergency.  Patient with long length of stay, discharge pending due to ability to obtain outpatient hemodialysis slot that can accommodate patient's mobility constraints secondary to decubitus ulcer wound and bilateral AKA.  Patient will need to be able to sit HD chair outpatient to get outpatient HD set up.  Hospital HD unit working on getting bariatric HD chair to accommodate patient.  Assessment & Plan:   ESRD on HD -- Following, appreciate assistance -- Continue hemodialysis on MWF schedule -- Continue midodrine -- Nephrology finalized outpatient HD at [facility] with a chair time 1 PM MWF  Chronic sacral decubitus wounds with chronic osteomyelitis Seen by infectious disease, treated with doxycycline in which she developed diarrhea and now has been off of oral antibiotics.  She has declined antibiotics due to GI issues and will continue with wound care.  Doxycycline was discontinued on 03/07/2022. -- Continue local wound care, offloading -- Fentanyl patch, oxycodone 5-10mg  PO q4h PRN  Calciphylaxis/abdominal wound: -- Continue wound care, patch, oxycodone as needed  Hypotension during HD -- Continue midodrine on HD days  Phantom limb pain Peripheral neuropathy -- Lyrica, gabapentin  Hyperkalemia -- Electrolyte management with HD  Peripheral vascular disease/peripheral arterial disease Follows with vascular  surgery outpatient, s/p bilateral AKA and amputation left middle finger. -- Continue aspirin, statin, Eliquis  Decubitus ulcer, sacral region, stage III, right hip, left flank Pressure Injury 11/12/21 Hip Right Unstageable - Full thickness tissue loss in which the base of the injury is covered by slough (yellow, tan, gray, green or brown) and/or eschar (tan, brown or black) in the wound bed. 90% covered with slough (Active)  11/12/21 0800  Location: Hip  Location Orientation: Right  Staging: Unstageable - Full thickness tissue loss in which the base of the injury is covered by slough (yellow, tan, gray, green or brown) and/or eschar (tan, brown or black) in the wound bed.  Wound Description (Comments): 90% covered with slough  Present on Admission: Yes (per RN)     Pressure Injury 11/24/21 Flank Left;Lower Stage 2 -  Partial thickness loss of dermis presenting as a shallow open injury with a red, pink wound bed without slough. (Active)  11/24/21 0430  Location: Flank  Location Orientation: Left;Lower  Staging: Stage 2 -  Partial thickness loss of dermis presenting as a shallow open injury with a red, pink wound bed without slough.  Wound Description (Comments):   Present on Admission: No  -- Continue local wound care, offloading  Hypothyroidism TSH 3.935 on 06/11/2021. --Levothyroxine 100 mcg p.o. daily  Type 2 diabetes mellitus Hemoglobin A1c 5.3 on 12/19/2021, diet controlled.  Hyperlipidemia -- Atorvastatin 10 mg p.o. daily  Chronic respiratory failure secondary to OHS -- Supplemental oxygen to maintain SpO2 greater than 92%  Constipation -- MiraLAX daily  Stye/blepharitis OS Completed 5-day course of azithromycin and 10-day course of tobramycin. -- Artificial tears PRN  Morbid obesity Body mass index is 42.63 kg/m.  Discussed with patient needs for aggressive  lifestyle changes/weight loss as this complicates all facets of care.  Outpatient follow-up with PCP.     DVT  prophylaxis: apixaban (ELIQUIS) tablet 2.5 mg Start: 04/28/22 1100 apixaban (ELIQUIS) tablet 2.5 mg    Code Status: Full Code Family Communication: No family present at bedside this morning  Disposition Plan:  Level of care: Med-Surg Status is: Inpatient Remains inpatient appropriate because: Anticipate discharge to SNF tomorrow after    Consultants:  Nephrology Infectious disease - signed off 12/1 IR for HD tunneled catheter exchange Vascular surgery Cardiology Orthopedics hand surgery,  Procedures:  TEE Left middle finger amputation 6/19  Antimicrobials:  Azithromycin 1/27 - 1/31 Vancomycin 8/22, 8/24, 10/31,12/1 Doxycycline 11/29 - 11/30 Cefepime 7/28, 7/29, 7/31, 8/2 Metronidazole 7/28 - 8/4   Subjective: Patient seen examined bedside, resting comfortably.  Sitting up in bed.  Awaiting hemodialysis later today.  Discussed with patient that she is pending insurance authorization and anticipate likely discharge to SNF tomorrow as now we have an outpatient dialysis center set up for her.  No other complaints or concerns at this time. Denies headache, no dizziness, no chest pain, no shortness of breath, no abdominal pain.  No acute events overnight per nursing staff.    Objective: Vitals:   05/13/22 1709 05/13/22 2058 05/14/22 0511 05/14/22 0945  BP: 139/69 131/70 137/64 139/88  Pulse: (!) 58 (!) 56 (!) 54 62  Resp: 19 18 18 18   Temp: 98.1 F (36.7 C) 98.3 F (36.8 C) 98 F (36.7 C) 97.7 F (36.5 C)  TempSrc: Oral Oral Oral Oral  SpO2: 99% 100% 100% 100%  Weight:      Height:        Intake/Output Summary (Last 24 hours) at 05/14/2022 1158 Last data filed at 05/14/2022 0859 Gross per 24 hour  Intake 460 ml  Output 0 ml  Net 460 ml   Filed Weights   05/09/22 1510 05/12/22 1316 05/12/22 1709  Weight: 119.2 kg 118.6 kg 116.2 kg    Examination:  Physical Exam: GEN: NAD, alert and oriented x 3, obese HEENT: NCAT, PERRL, EOMI, sclera clear, MMM PULM: CTAB w/o  wheezes/crackles, normal respiratory effort, on 2 L nasal cannula CV: RRR w/o M/G/R GI: abd soft, NTND, NABS, no R/G/M MSK: Noted bilateral AKA, left middle finger amputation, moves upper extremities independently. NEURO: No focal neurological deficits PSYCH: normal mood/affect Integumentary: Sacral wound noted, abdominal wound noted with dressing in place    Data Reviewed: I have personally reviewed following labs and imaging studies  CBC: Recent Labs  Lab 05/09/22 1527 05/12/22 1330  WBC 8.1 7.4  HGB 8.7* 8.4*  HCT 29.0* 28.5*  MCV 92.9 94.4  PLT 223 546   Basic Metabolic Panel: Recent Labs  Lab 05/09/22 1526 05/12/22 1330  NA 138 139  K 6.6* 6.3*  CL 100 98  CO2 26 25  GLUCOSE 87 121*  BUN 76* 72*  CREATININE 10.65* 10.49*  CALCIUM 9.5 9.6  PHOS 6.3* 6.8*   GFR: Estimated Creatinine Clearance: 8 mL/min (A) (by C-G formula based on SCr of 10.49 mg/dL (H)). Liver Function Tests: Recent Labs  Lab 05/09/22 1526 05/12/22 1330  ALBUMIN 3.2* 3.3*   No results for input(s): "LIPASE", "AMYLASE" in the last 168 hours. No results for input(s): "AMMONIA" in the last 168 hours. Coagulation Profile: No results for input(s): "INR", "PROTIME" in the last 168 hours. Cardiac Enzymes: No results for input(s): "CKTOTAL", "CKMB", "CKMBINDEX", "TROPONINI" in the last 168 hours. BNP (last 3 results) No results  for input(s): "PROBNP" in the last 8760 hours. HbA1C: No results for input(s): "HGBA1C" in the last 72 hours. CBG: No results for input(s): "GLUCAP" in the last 168 hours. Lipid Profile: No results for input(s): "CHOL", "HDL", "LDLCALC", "TRIG", "CHOLHDL", "LDLDIRECT" in the last 72 hours. Thyroid Function Tests: No results for input(s): "TSH", "T4TOTAL", "FREET4", "T3FREE", "THYROIDAB" in the last 72 hours. Anemia Panel: No results for input(s): "VITAMINB12", "FOLATE", "FERRITIN", "TIBC", "IRON", "RETICCTPCT" in the last 72 hours. Sepsis Labs: No results for  input(s): "PROCALCITON", "LATICACIDVEN" in the last 168 hours.  No results found for this or any previous visit (from the past 240 hour(s)).       Radiology Studies: No results found.      Scheduled Meds:  apixaban  2.5 mg Oral BID   aspirin EC  81 mg Oral Daily   atorvastatin  10 mg Oral Daily   darbepoetin (ARANESP) injection - DIALYSIS  200 mcg Subcutaneous Q Tue-1800   docusate sodium  100 mg Oral BID   famotidine  20 mg Oral Daily   feeding supplement  1 Container Oral TID BM   fentaNYL  1 patch Transdermal Q72H   gabapentin  100 mg Oral QHS   Gerhardt's butt cream   Topical BID   heparin sodium (porcine)  4,200 Units Intracatheter Once   levothyroxine  100 mcg Oral Q0600   midodrine  10 mg Oral Once per day on Mon Wed Fri   pantoprazole  40 mg Oral Daily   pregabalin  50 mg Oral Daily   sevelamer carbonate  4.8 g Oral TID WC   sucroferric oxyhydroxide  1,000 mg Oral TID WC   Continuous Infusions:  sodium thiosulfate 12.5 g in sodium chloride 0.9 % 150 mL Infusion for Calciphylaxis Stopped (05/12/22 1711)     LOS: 194 days    Time spent: 48 minutes spent on chart review, discussion with nursing staff, consultants, updating family and interview/physical exam; more than 50% of that time was spent in counseling and/or coordination of care.    Courtney Gray [place] DO [facility] Available via Epic secure chat 7am-7pm After these hours, please refer to coverage provider listed on [email or web address] 05/14/2022, 11:58 AM

## 2022-05-15 DIAGNOSIS — E1169 Type 2 diabetes mellitus with other specified complication: Secondary | ICD-10-CM | POA: Diagnosis not present

## 2022-05-15 DIAGNOSIS — L89153 Pressure ulcer of sacral region, stage 3: Secondary | ICD-10-CM | POA: Diagnosis not present

## 2022-05-15 DIAGNOSIS — I10 Essential (primary) hypertension: Secondary | ICD-10-CM | POA: Diagnosis not present

## 2022-05-15 DIAGNOSIS — N186 End stage renal disease: Secondary | ICD-10-CM | POA: Diagnosis not present

## 2022-05-15 MED ORDER — GERHARDT'S BUTT CREAM: 1.0000 | TOPICAL_CREAM | Freq: Two times a day (BID) | CUTANEOUS | Status: DC

## 2022-05-15 MED ORDER — GABAPENTIN 100 MG PO CAPS: 100.0000 mg | ORAL_CAPSULE | Freq: Every day | ORAL | Status: DC

## 2022-05-15 MED ORDER — SEVELAMER CARBONATE 2.4 G PO PACK: 4.8000 g | PACK | Freq: Three times a day (TID) | ORAL | Status: DC

## 2022-05-15 MED ORDER — DARBEPOETIN ALFA 200 MCG/0.4ML IJ SOSY: 200.0000 ug | PREFILLED_SYRINGE | INTRAMUSCULAR | Status: DC

## 2022-05-15 MED ORDER — MIDODRINE HCL 10 MG PO TABS: 10.0000 mg | ORAL_TABLET | ORAL | Status: DC

## 2022-05-15 MED ORDER — POLYVINYL ALCOHOL 1.4 % OP SOLN: 1.0000 [drp] | OPHTHALMIC | 0 refills | Status: DC | PRN

## 2022-05-15 MED ORDER — FENTANYL 25 MCG/HR TD PT72: 1.0000 | MEDICATED_PATCH | TRANSDERMAL | 0 refills | Status: DC

## 2022-05-15 MED ORDER — POLYETHYLENE GLYCOL 3350 17 G PO PACK: 17.0000 g | PACK | Freq: Every day | ORAL | 0 refills | Status: DC | PRN

## 2022-05-15 MED ORDER — SODIUM THIOSULFATE 250 MG/ML IV SOLN: 12.5000 g | INTRAVENOUS | Status: DC

## 2022-05-15 MED ORDER — FAMOTIDINE 20 MG PO TABS: 20.0000 mg | ORAL_TABLET | Freq: Every day | ORAL | Status: DC

## 2022-05-15 MED ORDER — SUCROFERRIC OXYHYDROXIDE 500 MG PO CHEW: 1000.0000 mg | CHEWABLE_TABLET | Freq: Three times a day (TID) | ORAL | Status: DC

## 2022-05-15 MED ORDER — ASPIRIN 81 MG PO TBEC: 81.0000 mg | DELAYED_RELEASE_TABLET | Freq: Every day | ORAL | 12 refills | Status: DC

## 2022-05-15 MED ORDER — LACTULOSE 10 GM/15ML PO SOLN: 30.0000 g | Freq: Two times a day (BID) | ORAL | 0 refills | Status: DC | PRN

## 2022-05-15 MED ORDER — PREGABALIN 50 MG PO CAPS: 50.0000 mg | ORAL_CAPSULE | Freq: Every day | ORAL | Status: DC

## 2022-05-15 MED ORDER — APIXABAN 2.5 MG PO TABS: 2.5000 mg | ORAL_TABLET | Freq: Two times a day (BID) | ORAL | Status: DC

## 2022-05-15 MED ORDER — OXYCODONE HCL 5 MG PO TABS: 5.0000 mg | ORAL_TABLET | ORAL | 0 refills | Status: DC | PRN

## 2022-05-15 MED ORDER — LORATADINE 10 MG PO TABS: 10.0000 mg | ORAL_TABLET | Freq: Every day | ORAL | Status: DC | PRN

## 2022-05-15 NOTE — Progress Notes (Signed)
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    PT Comments    Session completed with equipment rep Jason P.  from [employer]. Discharge plan developed and reviewed with pt and family present. TOC informed of need for medical transport van to d/c today so device will be ready for her OP dialysis session. Finalized needed equipment for discharge planning with patient input. Discussed d/c goals, and her future goals of going to SNF with intention to return a home once arranged. Educated on self-care, compliance, and participation with multiple disciplines to further progress with ADLs and mobility to an modified independent level. All questions answered. Will follow until d/c.   Recommendations for follow up therapy are one component of a multi-disciplinary discharge planning process, led by the attending physician.  Recommendations may be updated based on patient status, additional functional criteria and insurance authorization.  Follow Up Recommendations  Skilled nursing-short term rehab (<3 hours/day) Can patient physically be transported by private vehicle: No   Assistance Recommended at Discharge Intermittent Supervision/Assistance  Patient can return home with the following A little help with walking and/or transfers;Help with stairs or ramp for entrance;Assist for transportation (medical [name] Lei transport)   Financial risk analyst (measurements PT);Wheelchair cushion (measurements PT);Other (comment);Hospital bed  (cushion 20x24, power chair with tilt and recline)    Recommendations for Other Services       Precautions / Restrictions Precautions Precautions: Fall Precaution Comments: bil AKA, 2L O2 at baseline (has been tolerating mobility in power chair on RoomAir fine); when in power chair, MUST perform pressure relief every 30 minutes by tilting back for 10 minutes Restrictions Weight Bearing Restrictions: Yes Other Position/Activity Restrictions: when in power chair, MUST perform pressure relief every 30 minutes by tilting back for 10 minutes     Mobility  Bed Mobility                    Transfers                        Ambulation/Gait               General Gait Details: unable   Stairs             Wheelchair Mobility    Modified Rankin (Stroke Patients Only)       Balance                                            Cognition Arousal/Alertness: Awake/alert Behavior During Therapy: WFL for tasks assessed/performed Overall Cognitive Status: Within Functional Limits for tasks assessed                                          Exercises      General Comments General comments (skin integrity, edema, etc.): Session spent with  [name], eqiupment rep for powerchair in room with Sundeep and family. Discussed dispo planning, powerchair use, self care, and POC moving forward with plan for SNF. Measurements acquired from rep to complete paperwork for d/c with loaner and potential order once d/c from SNF. Pt to use loaner in order to transport to OP dialysis.      Pertinent Vitals/Pain Pain Assessment Pain Assessment: Faces Faces Pain Scale: Hurts little more Pain Location: wounds Pain Descriptors / Indicators: Guarding Pain Intervention(s): Monitored during session    Home Living                          Prior Function            PT Goals (current goals can now be found in the care plan  section) Acute Rehab PT Goals Patient Stated Goal: Reports wants to be up in power chair and be more independent; does not want to dc to SNF; wants to dc to private residence PT Goal Formulation: Patient unable to participate in goal setting Time For Goal Achievement: 05/25/22 Potential to Achieve Goals: Good Progress towards PT goals: Progressing toward goals    Frequency    Min 3X/week      PT Plan Current plan remains appropriate    Co-evaluation              AM-PAC PT "6 Clicks" Mobility   Outcome Measure  Help needed turning from your back to your side while in a flat bed without using bedrails?: None Help needed moving from lying on your back to sitting on the side of a flat bed without using bedrails?: None Help needed moving to and from a bed to a chair (including a wheelchair)?: A Little Help needed standing up from a chair using your arms (e.g., wheelchair or bedside chair)?: Total Help needed to walk in hospital room?: Total Help needed climbing 3-5 steps with a railing? : Total 6 Click Score: 14    End of Session Equipment Utilized During Treatment: Oxygen Activity Tolerance: Patient tolerated treatment well Patient left: in bed;with call bell/phone within reach Nurse Communication: Mobility status PT Visit Diagnosis: Other abnormalities of gait and mobility (R26.89);Muscle weakness (generalized) (M62.81) Pain - Right/Left: Left Pain - part of body:  (abdomen/flank)     Time: 7096-2836 PT Time Calculation (min) (ACUTE ONLY): 32 min  Charges:  $Self Care/Home Management: Holly, PT, DPT Physical Therapist Acute Rehabilitation Services [facility]    Ellouise Newer 05/15/2022, 10:27 AM

## 2022-05-15 NOTE — Progress Notes (Signed)
Melanny Wire to be discharged [facility] per MD order. Patient verbalized understanding.  Skin clean, dry and intact without evidence of skin break down, no evidence of skin tears noted. No complaints noted.  Patient free of lines, drains, and wounds.   Discharge packet assembled. An After Visit Summary (AVS) was printed and given to the patient as patient transporting via safe transport.  Report called to accepting facility by the primary nurse; all questions and concerns addressed.   Amaryllis Dyke, RN

## 2022-05-15 NOTE — Progress Notes (Signed)
Report given to Nicole Kindred RN at [facility] reflecting patient's current status.

## 2022-05-15 NOTE — Progress Notes (Signed)
Pt to d/c to snf today. Met with pt at bedside with CSW and RN CM. Pt advised that she will resume at [facility] MWF with 1:00 chair time. Pt will need to arrive at 12:40.Pt advised that snf will need to send her with an appropriate hoyer pad/sling to HD so pt can tx from w/c to HD chair. CSW has made snf aware of this info as well. Pt will d/c to snf with specialized w/c which pt will be transported to HD in. Contacted [facility] to advise clinic of pt's d/c today and that pt will resume care tomorrow. HD arrangements placed on pt's AVS. Renal PA to send orders to clinic.   Melven Sartorius Renal Navigator [phone]

## 2022-05-15 NOTE — Progress Notes (Signed)
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   OT comments  Pt appears in better spirits today, eagerly showed OT that she had completed LB dressing already w/ successful direction of daughter to assist during task. Continued discussions of BSC/toilet transfers w/ consideration of power chair though unable to attempt as staff entering for DC prep today! Pt able to demo AP transfer to power chair with no more than Supervision to ensure safety.   Recommendations for follow up therapy are one component of a multi-disciplinary discharge planning process, led by the attending physician.  Recommendations may be updated based on patient status, additional functional criteria and insurance authorization.    Follow Up Recommendations  Skilled nursing-short term rehab (<3 hours/day) (now agreeable to SNF; would benefit from [facility] follow up if opting to DC to extended stay motel with daughter)     Assistance Recommended at Discharge Intermittent Supervision/Assistance  Patient can return home with the following  A little help with walking and/or transfers;A little help with bathing/dressing/bathroom   Equipment Recommendations  Gray bed;Other (comment) (power chair with tilt in space features; bariatric BSC (drop arm may be helpful for increased transfer options))    Recommendations for Other Services      Precautions / Restrictions Precautions Precautions: Fall Precaution Comments: bil AKA, 2L O2 at baseline (has been  tolerating mobility in power chair on RoomAir fine); when in power chair, MUST perform pressure relief every 30 minutes by tilting back for 10 minutes Restrictions Weight Bearing Restrictions: Yes RLE Weight Bearing: Non weight bearing LLE Weight Bearing: Non weight bearing Other Position/Activity Restrictions: when in power chair, MUST perform pressure relief every 30 minutes by tilting back for 10 minutes       Mobility Bed Mobility Overal bed mobility: Modified Independent                  Transfers Overall transfer level: Needs assistance Equipment used:  (power chair) Transfers: Bed to chair/wheelchair/BSC         Anterior-Posterior transfers: Supervision   General transfer comment: pt assisting in directing staff to correct setting to lower seat height for transfer, able to scoot to power chair without assist once power chair placed beside bed     Balance Overall balance assessment: Needs assistance Sitting-balance support: No upper extremity supported Sitting balance-[name] Scale: Good                                     ADL either performed or assessed with clinical judgement   ADL Overall ADL's : Needs assistance/impaired                       Lower Body Dressing Details (indicate cue type and reason): Pt excitedly showing OT that she had her underwear on already. reports her daughter helped her and she was able to instruct her how to help her successfully   Toilet Transfer Details (indicate cue type and reason): further discussed toilet  transfers to/from power chair to Courtney Gray over toilet. Staff arriving to assist with DC so unable to attempt at this time                Extremity/Trunk Assessment Upper Extremity Assessment Upper Extremity Assessment: Overall WFL for tasks assessed   Lower Extremity Assessment Lower Extremity Assessment: Defer to PT evaluation        Vision   Vision Assessment?: No apparent visual  deficits   Perception     Praxis      Cognition Arousal/Alertness: Awake/alert Behavior During Therapy: WFL for tasks assessed/performed Overall Cognitive Status: Within Functional Limits for tasks assessed                                          Exercises      Shoulder Instructions       General Comments Session spent with Courtney Gray, eqiupment rep for powerchair in room with Courtney Gray and family. Discussed dispo planning, powerchair use, self care, and POC moving forward with plan for SNF. Measurements acquired from rep to complete paperwork for d/c with loaner and potential order once d/c from SNF. Pt to use loaner in order to transport to OP dialysis.    Pertinent Vitals/ Pain       Pain Assessment Pain Assessment: No/denies pain  Home Living                                          Prior Functioning/Environment              Frequency  Min 1X/week        Progress Toward Goals  OT Goals(current goals can now be found in the care plan section)  Progress towards OT goals: Progressing toward goals  Acute Rehab OT Goals Patient Stated Goal: happy to progress at rehab OT Goal Formulation: With patient Time For Goal Achievement: 05/16/22 Potential to Achieve Goals: Good  Plan Discharge plan remains appropriate;Frequency remains appropriate    Co-evaluation                 AM-PAC OT "6 Clicks" Daily Activity     Outcome Measure   Help from another person eating meals?: None Help from another person taking care of personal grooming?: A Little Help from another person toileting, which includes using toliet, bedpan, or urinal?: A Lot Help from another person bathing (including washing, rinsing, drying)?: A Lot Help from another person to put on and taking off regular upper body clothing?: A Little Help from another person to put on and taking off regular lower body clothing?: A Lot 6 Click Score: 16    End of  Session    OT Visit Diagnosis: Muscle weakness (generalized) (M62.81);History of falling (Z91.81)   Activity Tolerance Patient tolerated treatment well   Patient Left with nursing/sitter in room   Nurse Communication Mobility status        Time: 0623-7628 OT Time Calculation (min): 19 min  Charges: OT General Charges $OT Visit: 1 Visit OT Treatments $Self Care/Home Management : 8-22 mins  Malachy Chamber, OTR/L Acute Rehab Services Office: [phone]   Layla Maw 05/15/2022, 11:46 AM

## 2022-05-15 NOTE — Discharge Summary (Addendum)
Physician Discharge Summary  Courtney Gray MBT:[id number] DOB: June 05, 1970 DOA: 11/01/2021  PCP: Monico Blitz, MD  Admit date: 11/01/2021 Discharge date: 05/15/2022  Admitted From: Home Disposition: SNF  Recommendations for Outpatient Follow-up:  Follow up with PCP in 1-2 weeks  Home Health: N/A Equipment/Devices: Motorized/power wheelchair  Discharge Condition: Stable CODE STATUS: Full code Diet recommendation: Renal diet with fluid restriction  History of present illness:  Courtney Gray is a 52 y.o. female with past medical history significant for ESRD opn HD TTS, chronic diastolic congestive heart failure, PVD s/p bilateral AKA, type 2 diabetes mellitus, morbid obesity, chronic sacral wound, recent hospitalization for bacteremia with hepatic/splenic abscess on long-term IV antibiotics who presented to [facility] ED on 7/28 with shortness of breath in the setting of missed hemodialysis and admitted for hypertensive emergency.   Patient with long length of stay, discharge pending due to ability to obtain outpatient hemodialysis slot that can accommodate patient's mobility constraints secondary to decubitus ulcer wound and bilateral AKA.  Patient worked extensively with therapy while inpatient and was able to mobilize herself from the bed to a powered wheelchair and has been tolerating dialysis sitting up.  Now transitioning to SNF for continued therapy and outpatient hemodialysis.  Hospital course:  ESRD on HD Nephrology was consulted and followed during the hospital course.  Continues with midodrine on hemodialysis days.  Continues on a Monday/Wednesday/Friday schedule.  On discharge we will continue HD at [facility] with a chair time 1 PM MWF.  Continue outpatient follow-up with nephrology.   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  She has  declined antibiotics due to GI issues and will continue with wound care.  Doxycycline was discontinued on 03/07/2022. Continue local wound care, offloading. Fentanyl patch, oxycodone 5-10mg  PO q4h PRN   Calciphylaxis/abdominal wound: Continue wound care, patch, oxycodone as needed   Hypotension during HD Continue midodrine on HD days   Phantom limb pain Peripheral neuropathy Lyrica, gabapentin   Hyperkalemia Electrolyte management with HD   Peripheral vascular disease/peripheral arterial disease Follows with vascular surgery outpatient, s/p bilateral AKA and amputation left middle finger. Continue aspirin, statin.   Decubitus ulcer, sacral region, stage III, right hip, left flank Pressure Injury 11/12/21 Hip Right Unstageable - Full thickness tissue loss in which the base of the injury is covered by slough (yellow, tan, gray, green or brown) and/or eschar (tan, brown or black) in the wound bed. 90% covered with slough (Active)  11/12/21 0800  Location: Hip  Location Orientation: Right  Staging: Unstageable - Full thickness tissue loss in which the base of the injury is covered by slough (yellow, tan, gray, green or brown) and/or eschar (tan, brown or black) in the wound bed.  Wound Description (Comments): 90% covered with slough  Present on Admission: Yes (per RN)     Pressure Injury 11/24/21 Flank Left;Lower Stage 2 -  Partial thickness loss of dermis presenting as a shallow open injury with a red, pink wound bed without slough. (Active)  11/24/21 0430  Location: Flank  Location Orientation: Left;Lower  Staging: Stage 2 -  Partial thickness loss of dermis presenting as a shallow open injury with a red, pink wound bed without slough.  Wound Description (Comments):   Present on Admission: No  Continue local wound care, offloading   Hypothyroidism TSH 3.935 on 06/11/2021. Levothyroxine 100 mcg p.o. daily   Type 2 diabetes  mellitus Hemoglobin A1c 5.3 on 12/19/2021, diet controlled.    Hyperlipidemia Atorvastatin 10 mg p.o. daily   Chronic respiratory failure secondary to OHS Supplemental oxygen to maintain SpO2 greater than 92%   Constipation MiraLAX daily   Stye/blepharitis OS Completed 5-day course of azithromycin and 10-day course of tobramycin. Artificial tears PRN   Morbid obesity Body mass index is 42.63 kg/m.  Discussed with patient needs for aggressive lifestyle changes/weight loss as this complicates all facets of care.  Outpatient follow-up with PCP.   Discharge Diagnoses:  Active Problems:   End stage renal disease (HCC)   Hypertension   Decubitus ulcer of sacral region, stage 3 (HCC)   Type 2 diabetes mellitus with hyperlipidemia (HCC)   Chronic respiratory failure with hypoxia (HCC)   Bacteremia   PVD (peripheral vascular disease) (HCC)   Class 3 obesity (HCC)   Anemia due to chronic kidney disease   Hypertensive emergency   Leukocytosis   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HCC)   Pressure injury of skin   PAD (peripheral artery disease) (HCC)   Irritable bowel syndrome (IBS)   Amputation of left middle finger   GERD (gastroesophageal reflux disease)   Nodule of skin of abdomen    Discharge Instructions  Discharge Instructions     Call MD for:  difficulty breathing, headache or visual disturbances   Complete by: As directed    Call MD for:  extreme fatigue   Complete by: As directed    Call MD for:  persistant dizziness or light-headedness   Complete by: As directed    Call MD for:  persistant nausea and vomiting   Complete by: As directed    Call MD for:  severe uncontrolled pain   Complete by: As directed    Call MD for:  temperature >100.4   Complete by: As directed    Diet - low sodium heart healthy   Complete by: As directed    Discharge wound care:   Complete by: As directed    Pack right ischial wound with silver hydrofiber (Aquacel Ag+) to wound base, fluff fill wound bed with DRY kerlix or gauze. Top with  dry dressing. Change daily; when using foam dressing. Please try to keep silicone adhesive border out of patient's gluteal cleft/crease    Change dressings on the pannus/abdomen PRN PER PT REQUEST Wash the wounds on the right hip area and LLQ of the abdomen with saline, pat dry. Place xeroform and gauze to the areas.   Increase activity slowly   Complete by: As directed       Allergies as of 05/15/2022       Reactions   Benzonatate Other (See Comments)   Hallucinations   Hydralazine Other (See Comments)   Hallucinations   Amoxicillin Itching, Nausea And Vomiting   Clonidine Other (See Comments)   Altered mental state, insomnia    Chlorhexidine Itching   Patient reports it is the CHG baths that cause her itching not the CHG used for caring for her IV/HD access.     Morphine Itching   Oxycodone Itching   Pt tolerated hospital admission 07/2021        Medication List     STOP taking these medications    ascorbic acid 500 MG tablet Commonly known as: VITAMIN C   bisacodyl 10 MG/30ML Enem Commonly known as: FLEET   ceFEPIme 2 g in sodium chloride 0.9 % 100 mL   Cholecalciferol 25 MCG (1000 UT) capsule  collagenase 250 UNIT/GM ointment Commonly known as: SANTYL   diphenhydrAMINE 25 MG tablet Commonly known as: BENADRYL   doxycycline 100 MG capsule Commonly known as: VIBRAMYCIN   melatonin 3 MG Tabs tablet   metroNIDAZOLE 500 MG tablet Commonly known as: FLAGYL   Pro-Stat Renal Care Liqd   senna 8.6 MG tablet Commonly known as: SENOKOT   tiZANidine 4 MG capsule Commonly known as: ZANAFLEX       TAKE these medications    acetaminophen 500 MG tablet Commonly known as: TYLENOL Take 1,000 mg by mouth as needed for moderate pain.   aspirin EC 81 MG tablet Take 1 tablet (81 mg total) by mouth daily. Swallow whole. Start taking on: May 16, 2022   atorvastatin 10 MG tablet Commonly known as: LIPITOR Take 1 tablet (10 mg total) by mouth daily. What  changed: when to take this   Darbepoetin Alfa 200 MCG/0.4ML Sosy injection Commonly known as: ARANESP Inject 0.4 mLs (200 mcg total) into the skin every Tuesday at 6 PM. Start taking on: May 20, 2022   docusate sodium 100 MG capsule Commonly known as: COLACE Take 100 mg by mouth 2 (two) times daily.   famotidine 20 MG tablet Commonly known as: PEPCID Take 1 tablet (20 mg total) by mouth daily. Start taking on: May 16, 2022   fentaNYL 25 MCG/HR Commonly known as: Damar 1 patch onto the skin every 3 (three) days.   gabapentin 100 MG capsule Commonly known as: NEURONTIN Take 1 capsule (100 mg total) by mouth at bedtime. What changed: when to take this   Gerhardt's butt cream Crea Apply 1 Application topically 2 (two) times daily.   lactulose 10 GM/15ML solution Commonly known as: CHRONULAC Take 45 mLs (30 g total) by mouth 2 (two) times daily as needed for mild constipation.   levothyroxine 100 MCG tablet Commonly known as: SYNTHROID Take 100 mcg by mouth daily.   loperamide 2 MG tablet Commonly known as: IMODIUM A-D Take 4 mg by mouth as needed for diarrhea or loose stools.   loratadine 10 MG tablet Commonly known as: CLARITIN Take 1 tablet (10 mg total) by mouth daily as needed for allergies.   midodrine 10 MG tablet Commonly known as: PROAMATINE Take 1 tablet (10 mg total) by mouth every Monday, Wednesday, and Friday at 8 PM. Before hemodialysis Start taking on: May 16, 2022   nitroGLYCERIN 0.4 MG SL tablet Commonly known as: NITROSTAT Place 1 tablet (0.4 mg total) under the tongue every 5 (five) minutes as needed for chest pain.   oxyCODONE 5 MG immediate release tablet Commonly known as: Oxy IR/ROXICODONE Take 1-2 tablets (5-10 mg total) by mouth every 4 (four) hours as needed for moderate pain (pain score 4-6). What changed: how much to take   OXYGEN Inhale 4 L into the lungs See admin instructions. Oxygen at 4L hr via nasal cannula  and will use portable oxygen machine when up in wheelchair.   pantoprazole 40 MG tablet Commonly known as: PROTONIX Take 1 tablet (40 mg total) by mouth daily.   polyethylene glycol 17 g packet Commonly known as: MIRALAX / GLYCOLAX Take 17 g by mouth daily as needed for mild constipation.   polyvinyl alcohol 1.4 % ophthalmic solution Commonly known as: LIQUIFILM TEARS Place 1 drop into both eyes as needed for dry eyes.   pregabalin 50 MG capsule Commonly known as: LYRICA Take 1 capsule (50 mg total) by mouth daily. Start taking on: May 16, 2022  sevelamer carbonate 2.4 g Pack Commonly known as: RENVELA Take 4.8 g by mouth 3 (three) times daily with meals.   simethicone 125 MG chewable tablet Commonly known as: MYLICON Chew 607 mg by mouth in the morning and at bedtime.   sodium chloride 0.9 % SOLN 100 mL with sodium thiosulfate 250 MG/ML SOLN 12.5 g Inject 12.5 g into the vein every Monday, Wednesday, and Friday with hemodialysis. Start taking on: May 16, 2022   sucroferric oxyhydroxide 500 MG chewable tablet Commonly known as: VELPHORO Chew 2 tablets (1,000 mg total) by mouth 3 (three) times daily with meals.   Tums Smoothies 750 MG chewable tablet Generic drug: calcium carbonate Chew 1,500 mg by mouth See admin instructions. Take 1500 mg with each meal and 1500 mg with each snack               Discharge Care Instructions  (From admission, onward)           Start     Ordered   05/15/22 0000  Discharge wound care:       Comments: Pack right ischial wound with silver hydrofiber (Aquacel Ag+) to wound base, fluff fill wound bed with DRY kerlix or gauze. Top with dry dressing. Change daily; when using foam dressing. Please try to keep silicone adhesive border out of patient's gluteal cleft/crease    Change dressings on the pannus/abdomen PRN PER PT REQUEST Wash the wounds on the right hip area and LLQ of the abdomen with saline, pat dry. Place xeroform  and gauze to the areas.   05/15/22 1006            Contact information for follow-up providers     [facility]. Go on 05/16/2022.   Why: Schedule is Monday/Wednesday/Friday with 1:00 pm.  Please arrive by 12:40 pm. Please send an appropriate hoyer pad/sling with pt to HD appointment. Contact information: [place] [address] [phone]         Monico Blitz, MD. Schedule an appointment as soon as possible for a visit in 1 week(s).   Specialty: Internal Medicine Contact information: [place] [address] [phone]              Contact information for after-discharge care     Destination     [facility] Preferred SNF .   Service: Skilled Nursing Contact information: [place] [address] [phone]                    Allergies  Allergen Reactions   Benzonatate Other (See Comments)    Hallucinations   Hydralazine Other (See Comments)    Hallucinations   Amoxicillin Itching and Nausea And Vomiting   Clonidine Other (See Comments)    Altered mental state, insomnia    Chlorhexidine Itching    Patient reports it is the CHG baths that cause her itching not the CHG used for caring for her IV/HD access.     Morphine Itching   Oxycodone Itching    Pt tolerated hospital admission 07/2021    Consultations: Nephrology Infectious disease - signed off 12/1 IR for HD tunneled catheter exchange Vascular surgery Cardiology Orthopedics hand surgery   Subjective: Patient seen examined bedside, resting comfortably.  Sitting upright in bed.  Daughter present.  Patient now excited to transition to skilled nursing facility for further rehabilitation even though she was guarded about this earlier in the week.  Congratulated her on the progress  that she has made during this prolonged hospitalization and to continue to work hard in order to be able to eventually have fittings for  her prosthesis.  No other questions or concerns at this time.  Denies headache, no dizziness, no chest pain, no palpitations, no shortness of breath, no fever/chills/night sweats, no cough/congestion.  No acute events overnight per nursing staff.  Discharge Exam: Vitals:   05/14/22 1737 05/15/22 0105  BP: 134/69 (!) 117/35  Pulse: 60 (!) 49  Resp:  18  Temp: 98.2 F (36.8 C) 98.5 F (36.9 C)  SpO2: 100% 100%   Vitals:   05/14/22 1630 05/14/22 1700 05/14/22 1737 05/15/22 0105  BP:  137/78 134/69 (!) 117/35  Pulse: 60 60 60 (!) 49  Resp: 11   18  Temp:   98.2 F (36.8 C) 98.5 F (36.9 C)  TempSrc:   Oral Oral  SpO2: 100% 100% 100% 100%  Weight:   116.8 kg   Height:        Physical Exam: GEN: NAD, alert and oriented x 3, obese HEENT: NCAT, PERRL, EOMI, sclera clear, MMM PULM: CTAB w/o wheezes/crackles, normal respiratory effort, on 2 L nasal cannula CV: RRR w/o M/G/R GI: abd soft, NTND, NABS, no R/G/M MSK: Noted bilateral AKA, left middle finger amputation, moves upper extremities independently. NEURO: No focal neurological deficits PSYCH: normal mood/affect Integumentary: Sacral wound noted, abdominal wound noted with dressing in place    The results of significant diagnostics from this hospitalization (including imaging, microbiology, ancillary and laboratory) are listed below for reference.     Microbiology: No results found for this or any previous visit (from the past 240 hour(s)).   Labs: BNP (last 3 results) Recent Labs    07/09/21 1105 10/27/21 0016  BNP 1,448.0* 1,062.6*   Basic Metabolic Panel: Recent Labs  Lab 05/09/22 1526 05/12/22 1330 05/14/22 1000  NA 138 139 139  K 6.6* 6.3* 6.2*  CL 100 98 96*  CO2 26 25 26   GLUCOSE 87 121* 86  BUN 76* 72* 57*  CREATININE 10.65* 10.49* 9.04*  CALCIUM 9.5 9.6 9.6  PHOS 6.3* 6.8* 6.6*   Liver Function Tests: Recent Labs  Lab 05/09/22 1526 05/12/22 1330 05/14/22 1000  ALBUMIN 3.2* 3.3* 3.1*   No  results for input(s): "LIPASE", "AMYLASE" in the last 168 hours. No results for input(s): "AMMONIA" in the last 168 hours. CBC: Recent Labs  Lab 05/09/22 1527 05/12/22 1330  WBC 8.1 7.4  HGB 8.7* 8.4*  HCT 29.0* 28.5*  MCV 92.9 94.4  PLT 223 200   Cardiac Enzymes: No results for input(s): "CKTOTAL", "CKMB", "CKMBINDEX", "TROPONINI" in the last 168 hours. BNP: Invalid input(s): "POCBNP" CBG: No results for input(s): "GLUCAP" in the last 168 hours. D-Dimer No results for input(s): "DDIMER" in the last 72 hours. Hgb A1c No results for input(s): "HGBA1C" in the last 72 hours. Lipid Profile No results for input(s): "CHOL", "HDL", "LDLCALC", "TRIG", "CHOLHDL", "LDLDIRECT" in the last 72 hours. Thyroid function studies No results for input(s): "TSH", "T4TOTAL", "T3FREE", "THYROIDAB" in the last 72 hours.  Invalid input(s): "FREET3" Anemia work up No results for input(s): "VITAMINB12", "FOLATE", "FERRITIN", "TIBC", "IRON", "RETICCTPCT" in the last 72 hours. Urinalysis No results found for: "COLORURINE", "APPEARANCEUR", "LABSPEC", "PHURINE", "GLUCOSEU", "HGBUR", "BILIRUBINUR", "KETONESUR", "PROTEINUR", "UROBILINOGEN", "NITRITE", "LEUKOCYTESUR" Sepsis Labs Recent Labs  Lab 05/09/22 1527 05/12/22 1330  WBC 8.1 7.4   Microbiology No results found for this or any previous visit (from the past 240 hour(s)).   Time  coordinating discharge: Over 30 minutes  SIGNED:   Donnamarie Poag [place] DO  [facility] 05/15/2022, 10:28 AM

## 2022-05-15 NOTE — Progress Notes (Signed)
HOme med was at the bedside and was discontinued.  Patient is aware of it.

## 2022-05-15 NOTE — TOC Transition Note (Signed)
Transition of Care [facility]) - CM/SW Discharge Note   Patient Details  Name: Courtney Gray MRN: [id number] Date of Birth: 06/24/1970  [facility] of [facility]) CM/SW Contact:  Milinda Antis, [place] Phone Number: 05/15/2022, 10:37 AM   Clinical Narrative:    Patient will DC to: [facility]  Anticipated DC date: 05/15/2022 Family notified: Daughter Armed forces technical officer by: [facility]   Per MD patient ready for DC to SNF. RN to call report prior to discharge ([phone] room 140). RN, patient, patient's family, and facility notified of DC. Discharge Summary sent to facility. DC packet on chart. Safe transport requested for patient.   CSW will sign off for now as social work intervention is no longer needed. Please consult [place] again if new needs arise.    Final next level of care: [place] Barriers to Discharge: Barriers Resolved   Patient Goals and CMS Choice      Discharge Placement                Patient chooses bed at:  [facility]) Patient to be transferred to facility by: [facility] Name of family member notified: Gray,Courtney (Daughter) [phone] Patient and family notified of of transfer: 05/15/22  Discharge Plan and Services Additional resources added to the After Visit Summary for                                       Social Determinants of Health (SDOH) Interventions SDOH Screenings   Food Insecurity: No Food Insecurity (12/11/2021)  Housing: Low Risk  (12/11/2021)  Transportation Needs: No Transportation Needs (12/11/2021)  Utilities: At Risk (12/11/2021)  Tobacco Use: Low Risk  (03/07/2022)     Readmission Risk Interventions    09/16/2021    4:36 PM 08/28/2021   11:50 AM 07/12/2021   12:38 PM  Readmission Risk Prevention Plan  Transportation Screening Complete Complete Complete  Medication Review Press photographer)  Complete Complete  PCP or Specialist appointment within 3-5 days of discharge  Complete Complete   HRI or Home Care Consult Complete Complete Complete  SW Recovery Care/Counseling Consult Complete Complete Complete  Palliative Care Screening  Not Applicable Not Applicable  Skilled Nursing Facility Complete Not Applicable Complete

## 2022-05-15 NOTE — Care Management Important Message (Signed)
Important Message  Patient Details  Name: Courtney Gray MRN: [id number] Date of Birth: 1971-03-02   Medicare Important Message Given:  Yes Patient left prior to IM delivery will mail to the patient home address.    Tola Meas 05/15/2022, 12:12 PM

## 2022-05-15 NOTE — Progress Notes (Signed)
[facility] Progress Note   Subjective:  Seen in room - no new concerns. Plan is for discharge to SNF today. She is trying to be positive about it - wished her the best.  Objective Vitals:   05/14/22 1630 05/14/22 1700 05/14/22 1737 05/15/22 0105  BP:  137/78 134/69 (!) 117/35  Pulse: 60 60 60 (!) 49  Resp: 11   18  Temp:   98.2 F (36.8 C) 98.5 F (36.9 C)  TempSrc:   Oral Oral  SpO2: 100% 100% 100% 100%  Weight:   116.8 kg   Height:       Physical Exam General: Obese, well appearing, tNAD. Heart: RRR Lungs: CTAB Abdomen: bandages to B lateral pannus wounds Extremities: B AKA Dialysis Access: [facility]  Additional Objective Labs: Basic Metabolic Panel: Recent Labs  Lab 05/09/22 1526 05/12/22 1330 05/14/22 1000  NA 138 139 139  K 6.6* 6.3* 6.2*  CL 100 98 96*  CO2 26 25 26   GLUCOSE 87 121* 86  BUN 76* 72* 57*  CREATININE 10.65* 10.49* 9.04*  CALCIUM 9.5 9.6 9.6  PHOS 6.3* 6.8* 6.6*   Liver Function Tests: Recent Labs  Lab 05/09/22 1526 05/12/22 1330 05/14/22 1000  ALBUMIN 3.2* 3.3* 3.1*   CBC: Recent Labs  Lab 05/09/22 1527 05/12/22 1330  WBC 8.1 7.4  HGB 8.7* 8.4*  HCT 29.0* 28.5*  MCV 92.9 94.4  PLT 223 200   Blood Culture    Component Value Date/Time   SDES BLOOD LEFT ANTECUBITAL 10/16/2021 1534   SPECREQUEST  10/16/2021 1534    BOTTLES DRAWN AEROBIC AND ANAEROBIC Blood Culture results may not be optimal due to an inadequate volume of blood received in culture bottles   CULT  10/16/2021 1534    NO GROWTH 5 DAYS Performed at [facility], [place] [address], [place], [place] [address]    REPTSTATUS 10/21/2021 FINAL 10/16/2021 1534   Medications:  sodium thiosulfate 12.5 g in sodium chloride 0.9 % 150 mL 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 (05/14/22 1741)    apixaban  2.5 mg Oral BID   aspirin EC  81 mg Oral Daily   atorvastatin  10 mg Oral Daily   darbepoetin (ARANESP) injection - DIALYSIS  200 mcg Subcutaneous Q Tue-1800    docusate sodium  100 mg Oral BID   famotidine  20 mg Oral Daily   feeding supplement  1 Container Oral TID BM   fentaNYL  1 patch Transdermal Q72H   gabapentin  100 mg Oral QHS   Gerhardt's butt cream   Topical BID   levothyroxine  100 mcg Oral Q0600   midodrine  10 mg Oral Once per day on Mon Wed Fri   pantoprazole  40 mg Oral Daily   pregabalin  50 mg Oral Daily   sevelamer carbonate  4.8 g Oral TID WC   sucroferric oxyhydroxide  1,000 mg Oral TID WC    Dialysis Orders: Orders sent to AF unit on 05/14/22 4hr, TDC, BFR 400, DFRA1.5, EDW 116kg, 2K/2Ca - no VDRA, give NaThio 12.5g q HD - Mircera 290mcg IV q 2 weeks   Assessment/Plan: Debility: Had been unable to tolerate dialysis in recliner and therefore unable to discharge to outpatient dialysis. She has lost significant weight this admit (> 50 lbs). Sacral decub ulcers remain a barrier, but now sitting in her powerchair with cushion for longer periods.  Appreciate efforts from PT team.   ESRD: Previously on TTS schedule -> last week dialyzed  only Tues/Fri d/t constipation issues. Changed to MWF this week - for discharge today and OP HD tomorrow - orders sent. 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 No calcium based products or vitamin D. Gets 1/2 dose NaThiosulfate 12.5g q HD - could not tolerate higher dose. HTN /volume: On midodrine 10mg  qHD, with 10mg  prn mid HD w/parameters to keep SBP >100 d/t becoming symptomatic when it drops lower. Significant weight loss this admit Severe ischial sacral decubitus wound: CT showing chronic osteomyelitis. Limiting her, noted to be slow healing. Goal to tolerate sitting upright - which she is doing with PT.  COPD/Chronic O2 use: Prior O2 at home (5L/min) -> tapered to 2-3L/min here Anemia of ESRD: Hgb 8.4  - on max dose Aranesp 246mcg q Tues. Recent Fe load completed. Follow-up lab trend. Secondary hyperparathyroidism: On Renvela pwdr 4.8g TID and  Velphoro 1/meals -> increased to 2/meals. CorrCa elevated. Not on VDRA, last PTH 86 (low) on 03/11/22 -> repeat PTH 124 (05/06/22). Continue to avoid all calcium based medications and Vit D. 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 Nutrition - Alb 3.3 on regular diet.  Would really prefer she be on a renal diet but she refuses. Disposition: Difficult situation. Prior to admit, patient required 6 staff members and 2 hoyer pads for transfer into HD chair. Could not stay on HD for full tx. 2/2 pain of multiple decubitus ulcers and PTAR unable to lift pt into ambulance for transport. SW working on SNF. She now has new power chair and cushion and she has been sitting in the chair for longer periods over the past few weeks. Tentative plan to discharge to SNF [facility]) today. [name], PA-C 05/15/2022, 10:27 AM  [employer]

## 2022-05-25 ENCOUNTER — Other Ambulatory Visit: Payer: Self-pay

## 2022-05-25 ENCOUNTER — Emergency Department (HOSPITAL_COMMUNITY)
Admission: EM | Admit: 2022-05-25 | Discharge: 2022-05-25 | Disposition: A | Discharge status: Home / Self Care | Payer: 59 | Source: Home / Self Care | Attending: Emergency Medicine | Admitting: Emergency Medicine

## 2022-05-25 ENCOUNTER — Encounter (HOSPITAL_COMMUNITY): Payer: Self-pay

## 2022-05-25 ENCOUNTER — Observation Stay (HOSPITAL_COMMUNITY)
Admission: EM | Admit: 2022-05-25 | Discharge: 2022-05-28 | Disposition: A | Discharge status: Skilled Nursing Facility | Payer: 59 | Attending: Internal Medicine | Admitting: Internal Medicine

## 2022-05-25 ENCOUNTER — Encounter (HOSPITAL_COMMUNITY): Payer: Self-pay | Admitting: Surgery

## 2022-05-25 DIAGNOSIS — Z89611 Acquired absence of right leg above knee: Secondary | ICD-10-CM | POA: Diagnosis not present

## 2022-05-25 DIAGNOSIS — E1122 Type 2 diabetes mellitus with diabetic chronic kidney disease: Secondary | ICD-10-CM | POA: Insufficient documentation

## 2022-05-25 DIAGNOSIS — K219 Gastro-esophageal reflux disease without esophagitis: Secondary | ICD-10-CM | POA: Diagnosis present

## 2022-05-25 DIAGNOSIS — I10 Essential (primary) hypertension: Secondary | ICD-10-CM | POA: Diagnosis present

## 2022-05-25 DIAGNOSIS — S31109S Unspecified open wound of abdominal wall, unspecified quadrant without penetration into peritoneal cavity, sequela: Secondary | ICD-10-CM | POA: Diagnosis not present

## 2022-05-25 DIAGNOSIS — G546 Phantom limb syndrome with pain: Secondary | ICD-10-CM | POA: Diagnosis not present

## 2022-05-25 DIAGNOSIS — Z5189 Encounter for other specified aftercare: Secondary | ICD-10-CM

## 2022-05-25 DIAGNOSIS — I110 Hypertensive heart disease with heart failure: Secondary | ICD-10-CM | POA: Insufficient documentation

## 2022-05-25 DIAGNOSIS — E785 Hyperlipidemia, unspecified: Secondary | ICD-10-CM

## 2022-05-25 DIAGNOSIS — S31109A Unspecified open wound of abdominal wall, unspecified quadrant without penetration into peritoneal cavity, initial encounter: Secondary | ICD-10-CM | POA: Diagnosis present

## 2022-05-25 DIAGNOSIS — N186 End stage renal disease: Secondary | ICD-10-CM | POA: Diagnosis present

## 2022-05-25 DIAGNOSIS — D631 Anemia in chronic kidney disease: Secondary | ICD-10-CM | POA: Diagnosis present

## 2022-05-25 DIAGNOSIS — Z79899 Other long term (current) drug therapy: Secondary | ICD-10-CM | POA: Diagnosis not present

## 2022-05-25 DIAGNOSIS — I2510 Atherosclerotic heart disease of native coronary artery without angina pectoris: Secondary | ICD-10-CM | POA: Diagnosis present

## 2022-05-25 DIAGNOSIS — Z992 Dependence on renal dialysis: Secondary | ICD-10-CM | POA: Insufficient documentation

## 2022-05-25 DIAGNOSIS — Z7982 Long term (current) use of aspirin: Secondary | ICD-10-CM | POA: Insufficient documentation

## 2022-05-25 DIAGNOSIS — E782 Mixed hyperlipidemia: Secondary | ICD-10-CM

## 2022-05-25 DIAGNOSIS — D649 Anemia, unspecified: Secondary | ICD-10-CM

## 2022-05-25 DIAGNOSIS — I132 Hypertensive heart and chronic kidney disease with heart failure and with stage 5 chronic kidney disease, or end stage renal disease: Secondary | ICD-10-CM | POA: Insufficient documentation

## 2022-05-25 DIAGNOSIS — X58XXXA Exposure to other specified factors, initial encounter: Secondary | ICD-10-CM | POA: Insufficient documentation

## 2022-05-25 DIAGNOSIS — E1169 Type 2 diabetes mellitus with other specified complication: Secondary | ICD-10-CM | POA: Diagnosis present

## 2022-05-25 DIAGNOSIS — I959 Hypotension, unspecified: Secondary | ICD-10-CM | POA: Diagnosis not present

## 2022-05-25 DIAGNOSIS — Z862 Personal history of diseases of the blood and blood-forming organs and certain disorders involving the immune mechanism: Secondary | ICD-10-CM | POA: Diagnosis present

## 2022-05-25 DIAGNOSIS — Z89612 Acquired absence of left leg above knee: Secondary | ICD-10-CM | POA: Diagnosis not present

## 2022-05-25 DIAGNOSIS — S31134A Puncture wound of abdominal wall without foreign body, left lower quadrant without penetration into peritoneal cavity, initial encounter: Principal | ICD-10-CM | POA: Insufficient documentation

## 2022-05-25 DIAGNOSIS — J9611 Chronic respiratory failure with hypoxia: Secondary | ICD-10-CM | POA: Diagnosis present

## 2022-05-25 DIAGNOSIS — E119 Type 2 diabetes mellitus without complications: Secondary | ICD-10-CM | POA: Insufficient documentation

## 2022-05-25 DIAGNOSIS — R578 Other shock: Secondary | ICD-10-CM | POA: Diagnosis present

## 2022-05-25 DIAGNOSIS — I509 Heart failure, unspecified: Secondary | ICD-10-CM | POA: Insufficient documentation

## 2022-05-25 DIAGNOSIS — Z4800 Encounter for change or removal of nonsurgical wound dressing: Secondary | ICD-10-CM | POA: Insufficient documentation

## 2022-05-25 DIAGNOSIS — I5032 Chronic diastolic (congestive) heart failure: Secondary | ICD-10-CM | POA: Diagnosis not present

## 2022-05-25 DIAGNOSIS — N189 Chronic kidney disease, unspecified: Secondary | ICD-10-CM | POA: Diagnosis present

## 2022-05-25 DIAGNOSIS — I739 Peripheral vascular disease, unspecified: Secondary | ICD-10-CM | POA: Diagnosis present

## 2022-05-25 HISTORY — DX: Hypertensive emergency: I16.1

## 2022-05-25 HISTORY — DX: End stage renal disease: N18.6

## 2022-05-25 HISTORY — DX: Bacteremia: R78.81

## 2022-05-25 LAB — I-STAT CHEM 8, ED
BUN: 38 mg/dL — ABNORMAL HIGH (ref 6–20)
Calcium, Ion: 1.08 mmol/L — ABNORMAL LOW (ref 1.15–1.40)
Chloride: 99 mmol/L (ref 98–111)
Creatinine, Ser: 8.7 mg/dL — ABNORMAL HIGH (ref 0.44–1.00)
Glucose, Bld: 84 mg/dL (ref 70–99)
HCT: 29 % — ABNORMAL LOW (ref 36.0–46.0)
Hemoglobin: 9.9 g/dL — ABNORMAL LOW (ref 12.0–15.0)
Potassium: 5.5 mmol/L — ABNORMAL HIGH (ref 3.5–5.1)
Sodium: 136 mmol/L (ref 135–145)
TCO2: 27 mmol/L (ref 22–32)

## 2022-05-25 LAB — CBC WITH DIFFERENTIAL/PLATELET
Abs Immature Granulocytes: 0.03 10*3/uL (ref 0.00–0.07)
Basophils Absolute: 0 10*3/uL (ref 0.0–0.1)
Basophils Relative: 1 %
Eosinophils Absolute: 0.1 10*3/uL (ref 0.0–0.5)
Eosinophils Relative: 1 %
HCT: 29.6 % — ABNORMAL LOW (ref 36.0–46.0)
Hemoglobin: 8.9 g/dL — ABNORMAL LOW (ref 12.0–15.0)
Immature Granulocytes: 0 %
Lymphocytes Relative: 18 %
Lymphs Abs: 1.5 10*3/uL (ref 0.7–4.0)
MCH: 27.7 pg (ref 26.0–34.0)
MCHC: 30.1 g/dL (ref 30.0–36.0)
MCV: 92.2 fL (ref 80.0–100.0)
Monocytes Absolute: 0.8 10*3/uL (ref 0.1–1.0)
Monocytes Relative: 9 %
Neutro Abs: 6.1 10*3/uL (ref 1.7–7.7)
Neutrophils Relative %: 71 %
Platelets: 176 10*3/uL (ref 150–400)
RBC: 3.21 MIL/uL — ABNORMAL LOW (ref 3.87–5.11)
RDW: 17.5 % — ABNORMAL HIGH (ref 11.5–15.5)
WBC: 8.5 10*3/uL (ref 4.0–10.5)
nRBC: 0 % (ref 0.0–0.2)

## 2022-05-25 LAB — BASIC METABOLIC PANEL
Anion gap: 18 — ABNORMAL HIGH (ref 5–15)
BUN: 38 mg/dL — ABNORMAL HIGH (ref 6–20)
CO2: 24 mmol/L (ref 22–32)
Calcium: 9.5 mg/dL (ref 8.9–10.3)
Chloride: 95 mmol/L — ABNORMAL LOW (ref 98–111)
Creatinine, Ser: 7.56 mg/dL — ABNORMAL HIGH (ref 0.44–1.00)
GFR, Estimated: 6 mL/min — ABNORMAL LOW (ref >60)
Glucose, Bld: 106 mg/dL — ABNORMAL HIGH (ref 70–99)
Potassium: 5.4 mmol/L — ABNORMAL HIGH (ref 3.5–5.1)
Sodium: 137 mmol/L (ref 135–145)

## 2022-05-25 LAB — PROTIME-INR
INR: 1.3 — ABNORMAL HIGH (ref 0.8–1.2)
Prothrombin Time: 15.9 seconds — ABNORMAL HIGH (ref 11.4–15.2)

## 2022-05-25 LAB — BLOOD PRODUCT ORDER (VERBAL) VERIFICATION

## 2022-05-25 MED ORDER — SODIUM ZIRCONIUM CYCLOSILICATE 5 G PO PACK
5.0000 g | PACK | Freq: Once | ORAL | Status: DC
  Filled 2022-05-25: qty 1

## 2022-05-25 MED ORDER — LIDOCAINE-EPINEPHRINE (PF) 2 %-1:200000 IJ SOLN
INTRAMUSCULAR | Status: AC
  Filled 2022-05-25: qty 20

## 2022-05-25 MED ORDER — ASPIRIN 81 MG PO TBEC
81.0000 mg | DELAYED_RELEASE_TABLET | Freq: Every day | ORAL | Status: DC
  Administered 2022-05-26 – 2022-05-28 (×3): 81 mg via ORAL
  Filled 2022-05-25 (×3): qty 1

## 2022-05-25 MED ORDER — POLYVINYL ALCOHOL 1.4 % OP SOLN: 1.0000 [drp] | OPHTHALMIC | Status: DC | PRN

## 2022-05-25 MED ORDER — ATORVASTATIN CALCIUM 10 MG PO TABS
10.0000 mg | ORAL_TABLET | Freq: Every day | ORAL | Status: DC
  Administered 2022-05-25 – 2022-05-27 (×3): 10 mg via ORAL
  Filled 2022-05-25 (×3): qty 1

## 2022-05-25 MED ORDER — LEVOTHYROXINE SODIUM 100 MCG PO TABS
100.0000 ug | ORAL_TABLET | Freq: Every day | ORAL | Status: DC
  Administered 2022-05-26 – 2022-05-27 (×2): 100 ug via ORAL
  Filled 2022-05-25 (×3): qty 1

## 2022-05-25 MED ORDER — SODIUM CHLORIDE 0.9 % IV BOLUS
1000.0000 mL | Freq: Once | INTRAVENOUS | Status: AC
  Administered 2022-05-25: 1000 mL via INTRAVENOUS

## 2022-05-25 MED ORDER — SODIUM ZIRCONIUM CYCLOSILICATE 10 G PO PACK: 10.0000 g | PACK | Freq: Once | ORAL | Status: DC

## 2022-05-25 MED ORDER — POLYETHYLENE GLYCOL 3350 17 G PO PACK: 17.0000 g | PACK | Freq: Every day | ORAL | Status: DC | PRN

## 2022-05-25 MED ORDER — PANTOPRAZOLE SODIUM 40 MG PO TBEC
40.0000 mg | DELAYED_RELEASE_TABLET | Freq: Every day | ORAL | Status: DC
  Administered 2022-05-26 – 2022-05-27 (×2): 40 mg via ORAL
  Filled 2022-05-25 (×2): qty 1

## 2022-05-25 MED ORDER — ACETAMINOPHEN 650 MG RE SUPP: 650.0000 mg | Freq: Four times a day (QID) | RECTAL | Status: DC | PRN

## 2022-05-25 MED ORDER — INSULIN ASPART 100 UNIT/ML IJ SOLN: 0.0000 [IU] | Freq: Three times a day (TID) | INTRAMUSCULAR | Status: DC

## 2022-05-25 MED ORDER — SIMETHICONE 80 MG PO CHEW
80.0000 mg | CHEWABLE_TABLET | Freq: Four times a day (QID) | ORAL | Status: DC | PRN
  Administered 2022-05-25 – 2022-05-27 (×2): 80 mg via ORAL
  Filled 2022-05-25 (×2): qty 1

## 2022-05-25 MED ORDER — SODIUM CHLORIDE 0.9% FLUSH
3.0000 mL | Freq: Two times a day (BID) | INTRAVENOUS | Status: DC
  Administered 2022-05-26 – 2022-05-28 (×4): 3 mL via INTRAVENOUS

## 2022-05-25 MED ORDER — ACETAMINOPHEN 325 MG PO TABS
650.0000 mg | ORAL_TABLET | Freq: Four times a day (QID) | ORAL | Status: DC | PRN
  Administered 2022-05-25 – 2022-05-26 (×2): 650 mg via ORAL
  Filled 2022-05-25 (×2): qty 2

## 2022-05-25 MED ORDER — OXYCODONE HCL 5 MG PO TABS
5.0000 mg | ORAL_TABLET | Freq: Four times a day (QID) | ORAL | Status: DC | PRN
  Administered 2022-05-25 – 2022-05-26 (×2): 5 mg via ORAL
  Filled 2022-05-25 (×2): qty 1

## 2022-05-25 MED ORDER — LOPERAMIDE HCL 2 MG PO CAPS: 4.0000 mg | ORAL_CAPSULE | ORAL | Status: DC | PRN

## 2022-05-25 MED ORDER — MIDODRINE HCL 5 MG PO TABS
10.0000 mg | ORAL_TABLET | ORAL | Status: DC
  Administered 2022-05-26 – 2022-05-28 (×2): 10 mg via ORAL
  Filled 2022-05-25 (×4): qty 2

## 2022-05-25 NOTE — ED Notes (Signed)
Blood Bank called & informed no more blood is needed emergently for this pt.

## 2022-05-25 NOTE — ED Notes (Signed)
Blood Bank called for 4/4 via [place] MD verbal order.

## 2022-05-25 NOTE — ED Notes (Signed)
Pts [place] 18g IV in upper Lt arm infiltrated when NS bolus was started when her BP was noted to drop in the 50s via ... Manual BP was 46/24. EDP called to bedside, emergency release blood pulled via verbal order & Larene Pickett was used to infuse blood in a new 20g IV in upper Lt shoulder area.

## 2022-05-25 NOTE — Consult Note (Signed)
NAME:  Courtney Gray, MRN:  [id number], DOB:  [birth year], LOS: 0 ADMISSION DATE:  05/25/2022, CONSULTATION DATE:  05/25/22 REFERRING MD:  EDP, CHIEF COMPLAINT:  relative hypotension   History of Present Illness:  52 yo with extensive pmh presented with bleeding from LLQ. Per chart review pt was recently discharged 2/2 wound on her R flank and unfortunately returned this afternoon with new bleeding form an abdominal wound. Per surgery note, manural pressure was held and suture ligation attempted by significant bleeding persisted as pt was on eliquis (last dose this am). Gen surg was consulted and per note "Venous bleeding was noted on exam and controlled with absorbable suture placement. Continue local wound care. Please call with any further questions or concerns.".   CCM was called in consultation for eval with hypotension. Pt at this time is hemodynamically stable without vasopressors after transfusion. She is on midodrine at baseline with HD. Could consider increasing frequency during acute illness. Otherwise maintaining mentation and airway. No acute indication for ICU admission. Please call with any further questions or concerns.     Pertinent  Medical History  Esrd Dm2 with hyperglycemia Hyperlipidemia Htn Gout B/L AKA   Significant Hospital Events: Including procedures, antibiotic start and stop dates in addition to other pertinent events   Admitted to [facility] 2/18 Drop in BP precipitated consult 2/18 to ccm  Interim History / Subjective:    Objective   Blood pressure (!) 113/45, pulse (!) 59, temperature 98 F (36.7 C), temperature source Oral, resp. rate 16, SpO2 100 %.       No intake or output data in the 24 hours ending 05/25/22 1914 There were no vitals filed for this visit.  Examination: General: NAD, chronically ill appearing, reclining in bed HENT: ncat, eomi mmmp Lungs: ctab Cardiovascular: rrr Abdomen: obsese, soft nt, nd bs + Extremities: b/l aka Neuro: no  focal deficits GU: deferred  Resolved Hospital Problem list     Assessment & Plan:  Relative hypotension:  Acute bleed from abdominal wound -improved with blood transfusion and volume resuscitation -close monitoring required but no acute ICU needs at this time.  -appears to have stabilized at this time  Esrd H/o calciphylaxis  Please call with any further questions.     Best Practice (right click and "Reselect all SmartList Selections" daily)   Diet/type: NPO DVT prophylaxis: SCD GI prophylaxis: N/A Lines: N/A Foley:  N/A Code Status:  full code Last date of multidisciplinary goals of care discussion [pending]  Labs   CBC: Recent Labs  Lab 05/25/22 1436 05/25/22 1707  WBC 8.5  --   NEUTROABS 6.1  --   HGB 8.9* 9.9*  HCT 29.6* 29.0*  MCV 92.2  --   PLT 176  --     Basic Metabolic Panel: Recent Labs  Lab 05/25/22 1436 05/25/22 1707  NA 137 136  K 5.4* 5.5*  CL 95* 99  CO2 24  --   GLUCOSE 106* 84  BUN 38* 38*  CREATININE 7.56* 8.70*  CALCIUM 9.5  --    GFR: Estimated Creatinine Clearance: 9.7 mL/min (A) (by C-G formula based on SCr of 8.7 mg/dL (H)). Recent Labs  Lab 05/25/22 1436  WBC 8.5    Liver Function Tests: No results for input(s): "AST", "ALT", "ALKPHOS", "BILITOT", "PROT", "ALBUMIN" in the last 168 hours. No results for input(s): "LIPASE", "AMYLASE" in the last 168 hours. No results for input(s): "AMMONIA" in the last 168 hours.  ABG    Component Value Date/Time  HCO3 21.6 07/11/2021 0036   TCO2 27 05/25/2022 1707   ACIDBASEDEF 4.2 (H) 07/11/2021 0036   O2SAT 100 07/11/2021 0036     Coagulation Profile: Recent Labs  Lab 05/25/22 1436  INR 1.3*    Cardiac Enzymes: No results for input(s): "CKTOTAL", "CKMB", "CKMBINDEX", "TROPONINI" in the last 168 hours.  HbA1C: Hgb A1c MFr Bld  Date/Time Value Ref Range Status  12/19/2021 03:09 AM 5.3 4.8 - 5.6 % Final    Comment:    (NOTE) Pre diabetes:           5.7%-6.4%  Diabetes:              >6.4%  Glycemic control for   <7.0% adults with diabetes   07/09/2021 02:29 PM 5.7 (H) 4.8 - 5.6 % Final    Comment:    (NOTE)         Prediabetes: 5.7 - 6.4         Diabetes: >6.4         Glycemic control for adults with diabetes: <7.0     CBG: No results for input(s): "GLUCAP" in the last 168 hours.  Review of Systems:   Per HPI  Past Medical History:  She,  has a past medical history of Arthritis, Bacteremia, Diabetes mellitus without complication ([place]), Dialysis complication, subsequent encounter, ESRD (end stage renal disease) ([place]), Gout, High cholesterol, Hypertension, Hypertensive emergency, Ischemic foot pain at rest (08/03/2021), and Renal disorder.   Surgical History:   Past Surgical History:  Procedure Laterality Date   ABDOMINAL AORTOGRAM W/LOWER EXTREMITY N/A 08/07/2021   Procedure: ABDOMINAL AORTOGRAM W/LOWER EXTREMITY;  Surgeon: Broadus John, MD;  Location: [place] [facility];  Service: Cardiovascular;  Laterality: N/A;   AMPUTATION Right 08/14/2021   Procedure: AMPUTATION ABOVE KNEE;  Surgeon: Marty Heck, MD;  Location: [place];  Service: Vascular;  Laterality: Right;   AMPUTATION Left 09/23/2021   Procedure: AMPUTATION MIDDLE FINGER;  Surgeon: Sherilyn Cooter, MD;  Location: [place];  Service: Orthopedics;  Laterality: Left;   AMPUTATION Left 10/09/2021   Procedure: LEFT ABOVE KNEE AMPUTATION;  Surgeon: Cherre Robins, MD;  Location: [facility] OR;  Service: Vascular;  Laterality: Left;   AV FISTULA PLACEMENT     x2, unsuccessful.    BUBBLE STUDY  09/27/2021   Procedure: BUBBLE STUDY;  Surgeon: Lelon Perla, MD;  Location: [facility] ENDOSCOPY;  Service: Cardiovascular;;   IR FLUORO GUIDE CV LINE LEFT  12/14/2020   IR FLUORO GUIDE CV LINE LEFT  02/20/2021   IR FLUORO GUIDE CV LINE LEFT  09/11/2021   IR FLUORO GUIDE CV LINE LEFT  10/18/2021   IR FLUORO GUIDE CV LINE LEFT  11/26/2021   IR FLUORO GUIDE CV LINE LEFT  11/28/2021   IR  FLUORO GUIDE CV LINE LEFT  02/04/2022   IR FLUORO GUIDE CV LINE LEFT  03/07/2022   IR FLUORO GUIDE CV LINE RIGHT  08/28/2020   IR FLUORO GUIDE CV LINE RIGHT  05/13/2021   IR FLUORO GUIDE CV LINE RIGHT  05/24/2021   IR INTRAVASCULAR ULTRASOUND NON CORONARY  03/07/2022   IR PERC TUN PERIT CATH WO PORT S&I /IMAG  09/06/2020   IR PTA VENOUS EXCEPT DIALYSIS CIRCUIT  02/20/2021   IR PTA VENOUS EXCEPT DIALYSIS CIRCUIT  05/13/2021   IR PTA VENOUS EXCEPT DIALYSIS CIRCUIT  11/28/2021   IR PTA VENOUS EXCEPT DIALYSIS CIRCUIT  03/07/2022   IR REMOVAL TUN CV CATH W/O FL  05/24/2021   IR  REMOVAL TUN CV CATH W/O FL  10/15/2021   IR US GUIDE VASC ACCESS LEFT  09/06/2020   IR US GUIDE VASC ACCESS LEFT  09/11/2021   IR US GUIDE VASC ACCESS LEFT  10/18/2021   IR US GUIDE VASC ACCESS RIGHT  05/24/2021   IR US GUIDE VASC ACCESS RIGHT  09/10/2021   IR VENO/JUGULAR RIGHT  09/10/2021   IR VENOCAVAGRAM SVC  02/20/2021   IR VENOCAVAGRAM SVC  11/26/2021   IR VENOCAVAGRAM SVC  03/07/2022   TEE WITHOUT CARDIOVERSION N/A 09/27/2021   Procedure: TRANSESOPHAGEAL ECHOCARDIOGRAM (TEE);  Surgeon: Lelon Perla, MD;  Location: [place];  Service: Cardiovascular;  Laterality: N/A;   UPPER EXTREMITY VENOGRAPHY Right 12/04/2021   Procedure: UPPER EXTREMITY VENOGRAPHY;  Surgeon: Broadus John, MD;  Location: [place] [facility];  Service: Cardiovascular;  Laterality: Right;     Social History:   reports that she has never smoked. She has never used smokeless tobacco. She reports that she does not drink alcohol and does not use drugs.   Family History:  Her family history includes Diabetes in her father and mother.   Allergies Allergies  Allergen Reactions   Benzonatate Other (See Comments)    Hallucinations   Hydralazine Other (See Comments)    Hallucinations   Amoxicillin Itching and Nausea And Vomiting   Clonidine Other (See Comments)    Altered mental state, insomnia    Chlorhexidine Itching    Patient reports it is the CHG  baths that cause her itching not the CHG used for caring for her IV/HD access.     Morphine Itching   Oxycodone Itching    Pt tolerated hospital admission 07/2021     Home Medications  Prior to Admission medications   Medication Sig Start Date End Date Taking? Authorizing Provider  acetaminophen (TYLENOL) 500 MG tablet Take 1,000 mg by mouth as needed for moderate pain.    [provider]  aspirin EC 81 MG tablet Take 1 tablet (81 mg total) by mouth daily. Swallow whole. 05/16/22   [place] Eric J, DO  atorvastatin (LIPITOR) 10 MG tablet Take 1 tablet (10 mg total) by mouth daily. Patient taking differently: Take 10 mg by mouth at bedtime. 11/28/20   Roxan Hockey, MD  calcium carbonate (TUMS SMOOTHIES) 750 MG chewable tablet Chew 1,500 mg by mouth See admin instructions. Take 1500 mg with each meal and 1500 mg with each snack    [provider]  Darbepoetin Alfa (ARANESP) 200 MCG/0.4ML SOSY injection Inject 0.4 mLs (200 mcg total) into the skin every Tuesday at 6 PM. 05/20/22   [place] Donnamarie Poag, DO  docusate sodium (COLACE) 100 MG capsule Take 100 mg by mouth 2 (two) times daily.    [provider]  famotidine (PEPCID) 20 MG tablet Take 1 tablet (20 mg total) by mouth daily. 05/16/22   [place] Donnamarie Poag, DO  fentaNYL (DURAGESIC) 25 MCG/HR Place 1 patch onto the skin every 3 (three) days. 05/15/22 06/14/22  [place] Donnamarie Poag, DO  gabapentin (NEURONTIN) 100 MG capsule Take 1 capsule (100 mg total) by mouth at bedtime. 05/15/22   [place] Eric J, DO  lactulose (CHRONULAC) 10 GM/15ML solution Take 45 mLs (30 g total) by mouth 2 (two) times daily as needed for mild constipation. 05/15/22   [place] Donnamarie Poag, DO  levothyroxine (SYNTHROID) 100 MCG tablet Take 100 mcg by mouth daily.    [provider]  loperamide (IMODIUM A-D) 2 MG tablet Take 4 mg  by mouth as needed for diarrhea or loose stools.    [provider]  loratadine (CLARITIN) 10 MG tablet Take 1 tablet (10 mg  total) by mouth daily as needed for allergies. 05/15/22   [place] Donnamarie Poag, DO  midodrine (PROAMATINE) 10 MG tablet Take 1 tablet (10 mg total) by mouth every Monday, Wednesday, and Friday at 8 PM. Before hemodialysis 05/16/22   [place] Eric J, DO  nitroGLYCERIN (NITROSTAT) 0.4 MG SL tablet Place 1 tablet (0.4 mg total) under the tongue every 5 (five) minutes as needed for chest pain. 05/24/21   [place] Donnamarie Poag, DO  Nystatin (GERHARDT'S BUTT CREAM) CREA Apply 1 Application topically 2 (two) times daily. 05/15/22   [place] Donnamarie Poag, DO  oxyCODONE (OXY IR/ROXICODONE) 5 MG immediate release tablet Take 1-2 tablets (5-10 mg total) by mouth every 4 (four) hours as needed for moderate pain (pain score 4-6). 05/15/22   [place] Donnamarie Poag, DO  OXYGEN Inhale 4 L into the lungs See admin instructions. Oxygen at 4L hr via nasal cannula and will use portable oxygen machine when up in wheelchair. 10/28/21   [provider]  pantoprazole (PROTONIX) 40 MG tablet Take 1 tablet (40 mg total) by mouth daily. 11/29/20   Roxan Hockey, MD  polyethylene glycol (MIRALAX / GLYCOLAX) 17 g packet Take 17 g by mouth daily as needed for mild constipation. 05/15/22   [place] Donnamarie Poag, DO  polyvinyl alcohol (LIQUIFILM TEARS) 1.4 % ophthalmic solution Place 1 drop into both eyes as needed for dry eyes. 05/15/22   [place] Donnamarie Poag, DO  pregabalin (LYRICA) 50 MG capsule Take 1 capsule (50 mg total) by mouth daily. 05/16/22   [place] Donnamarie Poag, DO  sevelamer carbonate (RENVELA) 2.4 g PACK Take 4.8 g by mouth 3 (three) times daily with meals. 05/15/22   [place] Donnamarie Poag, DO  simethicone (MYLICON) 0000000 MG chewable tablet Chew 125 mg by mouth in the morning and at bedtime.    [provider]  sodium chloride 0.9 % SOLN 100 mL with sodium thiosulfate 250 MG/ML SOLN 12.5 g Inject 12.5 g into the vein every Monday, Wednesday, and Friday with hemodialysis. 05/16/22   [place] Donnamarie Poag, DO  sucroferric oxyhydroxide (VELPHORO) 500 MG chewable tablet Chew 2  tablets (1,000 mg total) by mouth 3 (three) times daily with meals. 05/15/22   [place] Eric J, DO     Critical care time: 48

## 2022-05-25 NOTE — ED Provider Notes (Signed)
Courtney Gray   CSN: [id number] Arrival date & time: 05/25/22  1403     History  Chief Complaint  Patient presents with   Wound Check    Courtney Gray is a 52 y.o. female  History includes bilateral AKA, ESRD Monday Wednesday Friday, last dialysis Friday, morbid obesity, hypertension, hyperlipidemia, diabetes, CAD GERD, CHF, gout  Patient presents via EMS today for bleeding wound to the left anterior lower abdomen.  Patient has history of calciphylaxis and has had a pressure wound in the area that was being treated by her facility.  Patient had been discharged for a wound to the right flank earlier today, when they dropped her off at the facility they noticed bleeding to the left anterior lower abdomen.  There is no bleeding at that site earlier today.  They return to control with direct pressure prior to arrival.  No history of trauma to the area, mild pain with palpation and direct pressure to the area, no new injury, no fever, chills no additional concerns. HPI     Home Medications Prior to Admission medications   Medication Sig Start Date End Date Taking? Authorizing Provider  acetaminophen (TYLENOL) 500 MG tablet Take 1,000 mg by mouth as needed for moderate pain.    [provider]  aspirin EC 81 MG tablet Take 1 tablet (81 mg total) by mouth daily. Swallow whole. 05/16/22   [place] Eric J, DO  atorvastatin (LIPITOR) 10 MG tablet Take 1 tablet (10 mg total) by mouth daily. Patient taking differently: Take 10 mg by mouth at bedtime. 11/28/20   Roxan Hockey, MD  calcium carbonate (TUMS SMOOTHIES) 750 MG chewable tablet Chew 1,500 mg by mouth See admin instructions. Take 1500 mg with each meal and 1500 mg with each snack    [provider]  Darbepoetin Alfa (ARANESP) 200 MCG/0.4ML SOSY injection Inject 0.4 mLs (200 mcg total) into the skin every Tuesday at 6 PM. 05/20/22   [place] Donnamarie Poag, DO  docusate  sodium (COLACE) 100 MG capsule Take 100 mg by mouth 2 (two) times daily.    [provider]  famotidine (PEPCID) 20 MG tablet Take 1 tablet (20 mg total) by mouth daily. 05/16/22   [place] Donnamarie Poag, DO  fentaNYL (DURAGESIC) 25 MCG/HR Place 1 patch onto the skin every 3 (three) days. 05/15/22 06/14/22  [place] Donnamarie Poag, DO  gabapentin (NEURONTIN) 100 MG capsule Take 1 capsule (100 mg total) by mouth at bedtime. 05/15/22   [place] Eric J, DO  lactulose (CHRONULAC) 10 GM/15ML solution Take 45 mLs (30 g total) by mouth 2 (two) times daily as needed for mild constipation. 05/15/22   [place] Donnamarie Poag, DO  levothyroxine (SYNTHROID) 100 MCG tablet Take 100 mcg by mouth daily.    [provider]  loperamide (IMODIUM A-D) 2 MG tablet Take 4 mg by mouth as needed for diarrhea or loose stools.    [provider]  loratadine (CLARITIN) 10 MG tablet Take 1 tablet (10 mg total) by mouth daily as needed for allergies. 05/15/22   [place] Donnamarie Poag, DO  midodrine (PROAMATINE) 10 MG tablet Take 1 tablet (10 mg total) by mouth every Monday, Wednesday, and Friday at 8 PM. Before hemodialysis 05/16/22   [place] Eric J, DO  nitroGLYCERIN (NITROSTAT) 0.4 MG SL tablet Place 1 tablet (0.4 mg total) under the tongue every 5 (five) minutes as needed for chest pain. 05/24/21   [place] Eric J, DO  Nystatin (GERHARDT'S BUTT CREAM) CREA Apply 1 Application topically 2 (two) times daily. 05/15/22   [place] Donnamarie Poag, DO  oxyCODONE (OXY IR/ROXICODONE) 5 MG immediate release tablet Take 1-2 tablets (5-10 mg total) by mouth every 4 (four) hours as needed for moderate pain (pain score 4-6). 05/15/22   [place] Donnamarie Poag, DO  OXYGEN Inhale 4 L into the lungs See admin instructions. Oxygen at 4L hr via nasal cannula and will use portable oxygen machine when up in wheelchair. 10/28/21   [provider]  pantoprazole (PROTONIX) 40 MG tablet Take 1 tablet (40 mg total) by mouth daily. 11/29/20   Roxan Hockey, MD  polyethylene  glycol (MIRALAX / GLYCOLAX) 17 g packet Take 17 g by mouth daily as needed for mild constipation. 05/15/22   [place] Donnamarie Poag, DO  polyvinyl alcohol (LIQUIFILM TEARS) 1.4 % ophthalmic solution Place 1 drop into both eyes as needed for dry eyes. 05/15/22   [place] Donnamarie Poag, DO  pregabalin (LYRICA) 50 MG capsule Take 1 capsule (50 mg total) by mouth daily. 05/16/22   [place] Donnamarie Poag, DO  sevelamer carbonate (RENVELA) 2.4 g PACK Take 4.8 g by mouth 3 (three) times daily with meals. 05/15/22   [place] Donnamarie Poag, DO  simethicone (MYLICON) 0000000 MG chewable tablet Chew 125 mg by mouth in the morning and at bedtime.    [provider]  sodium chloride 0.9 % SOLN 100 mL with sodium thiosulfate 250 MG/ML SOLN 12.5 g Inject 12.5 g into the vein every Monday, Wednesday, and Friday with hemodialysis. 05/16/22   [place] Donnamarie Poag, DO  sucroferric oxyhydroxide (VELPHORO) 500 MG chewable tablet Chew 2 tablets (1,000 mg total) by mouth 3 (three) times daily with meals. 05/15/22   [place] Donnamarie Poag, DO      Allergies    Benzonatate, Hydralazine, Amoxicillin, Clonidine, Chlorhexidine, Morphine, and Oxycodone    Review of Systems   Review of Systems Ten systems are reviewed and are negative for acute change except as noted in the HPI  Physical Exam Updated Vital Signs There were no vitals taken for this visit. Physical Exam Constitutional:      Appearance: She is well-developed. She is obese. She is not ill-appearing or toxic-appearing.  HENT:     Head: Normocephalic and atraumatic.     Jaw: There is normal jaw occlusion.  Eyes:     General: Vision grossly intact. Gaze aligned appropriately.     Extraocular Movements: Extraocular movements intact.  Neck:     Trachea: Trachea and phonation normal.  Cardiovascular:     Rate and Rhythm: Normal rate and regular rhythm.  Pulmonary:     Effort: Pulmonary effort is normal.     Breath sounds: Normal air entry.  Abdominal:       Comments: Golf ball sized wound to the  left anterior lower abdomen with small arterial bleed.  Musculoskeletal:     Cervical back: Normal range of motion.  Neurological:     Mental Status: She is alert.     GCS: GCS eye subscore is 4. GCS verbal subscore is 5. GCS motor subscore is 6.  Psychiatric:        Mood and Affect: Mood normal.        Behavior: Behavior is cooperative.     ED Results / Procedures / Treatments   Labs (all labs ordered are listed, but only abnormal results are displayed) Labs Reviewed  CBC WITH DIFFERENTIAL/PLATELET  BASIC METABOLIC PANEL  PROTIME-INR  I-STAT CHEM 8,  ED  TYPE AND SCREEN    EKG None  Radiology No results found.  Procedures Procedures    Medications Ordered in ED Medications  lidocaine-EPINEPHrine (XYLOCAINE W/EPI) 2 %-1:200000 (PF) injection (has no administration in time range)    ED Course/ Medical Decision Making/ A&P                             Medical Decision Making 52 year old female presented with a bleeding wound to the left anterior lower abdomen that occurred after she arrived back to her facility today.  I saw this patient earlier today for a bleeding wound to the right flank.  The right flank wound was hemostatic upon ER arrival earlier today.  We had addressed the area and monitored for a couple of hours she had no recurrent bleeding associate was discharged back to her facility.  I did visualize the left anterior abdominal wound earlier today it was packed by her facility and had no active bleeding.  On arrival she has a small arterial bleed present to the left anterior abdomen with large amount of blood to her gown and around the stretcher.  Patient was seen and evaluated Dr. Sabra Heck, attempted suture ligation without success.  Consult was called to general surgery and Dr. Michaelle Birks was able to successfully ligate the vessel.  Given amount of blood loss and recurrence will obtain labs and monitor and admit to medicine team.  Amount and/or Complexity of  Data Reviewed Labs: ordered.   Care handoff given to Cherlynn June PA-C at shift change.  Plan of care at this time is to await labs and monitor patient.  Plan to admit to medicine once basic labs have returned for serial hemoglobin.  No indication for Eliquis reversal at this time given wound is hemostatic.  Gray: Portions of this report may have been transcribed using voice recognition software. Every effort was made to ensure accuracy; however, inadvertent computerized transcription errors may still be present.         Final Clinical Impression(s) / ED Diagnoses Final diagnoses:  None    Rx / DC Orders ED Discharge Orders     None         Gari Crown 05/25/22 1511    Noemi Chapel, MD 06/02/22 [phone]

## 2022-05-25 NOTE — ED Provider Notes (Signed)
Patient care assumed from previous provider at shift handoff. Please see Reather Converse note for full details  In short, patient presented to the emergency room via EMS due to a bleeding wound to the left anterior lower abdomen.  Patient with history of calciphylaxis and has had a pressure wound in that area which was being treated by her facility.  Patient was discharged earlier today for a wound to the right flank which was hemostatically stable.  Upon return to the facility they noticed the bleeding the left anterior lower abdomen.  The wound was described as golf ball sized the left anterior lower abdomen with a small arterial bleed.  Surgery was able to ligate the wound to stop the bleeding.  The patient is on Eliquis and had a significant amount of bleeding but bleeding has stopped at this time.  Patient has Monday Wednesday Friday dialysis with dialysis most recently completed yesterday. Physical Exam  There were no vitals taken for this visit.  Physical Exam  Procedures  Procedures  ED Course / MDM    Medical Decision Making Amount and/or Complexity of Data Reviewed Labs: ordered.  Risk Decision regarding hospitalization.   I spoke with Dr. Trilby Drummer of [facility] hospitalist who agreed to admit the patient for observation       Ronny Bacon 05/25/22 1702    Cristie Hem, MD 05/25/22 [phone]

## 2022-05-25 NOTE — Progress Notes (Addendum)
eLink Physician-Brief Progress Note Patient Name: Courtney Gray DOB: 1970-12-28 MRN: [id number]   Date of Service  05/25/2022  HPI/Events of Note  55 F obese (BMI 42), ESRD on HD, s/p bil AKA, presented with bleeding from chronic abdominal wound that was sutured in the ED. hypotension and lethargy in the ED with no reported recurrence after receiving 3 units PRBC.  Seen awake and being changed  eICU Interventions  Hemorrhagic shock now stable after transfusion and bleeding controlled If with recurrence of bleeding consider platelet transfusion in this patient on dialysis with dysfunctional platelets     Intervention Category Evaluation Type: New Patient Evaluation  Judd Lien 05/25/2022, 10:57 PM

## 2022-05-25 NOTE — ED Notes (Signed)
EDP Scheving at bedside inserting Central line.

## 2022-05-25 NOTE — ED Provider Notes (Signed)
Around time of admission, patient noted to be more lethargic with blood pressure reading of 58/32, checked manually which corroborated this.  Reevaluated bleeding sites, no evidence of ongoing bleeding.  Per report from earlier providers patient did lose substantial amount of blood and her hemoglobin initially was close to baseline so suspect patient likely becoming symptomatic from earlier blood loss.  Patient and daughter verbally consented for blood.  2 units of blood given through rapid transfusion with significant improvement in vitals and mentation.  Admitting physician at the bedside.  Due to poor IV access, need for additional IV access, central line in the right femoral vein placed, patient verbally consented for central line placement due to critically ill state.  Patient likely will still be admitted to medicine given significant improvement, discussed with admitting provider who requests critical care consult given episode of shock.  .Critical Care  Performed by: Cristie Hem, MD Authorized by: Cristie Hem, MD   Critical care provider statement:    Critical care time (minutes):  30   Critical care was necessary to treat or prevent imminent or life-threatening deterioration of the following conditions:  Circulatory failure and shock   Critical care was time spent personally by me on the following activities:  Development of treatment plan with patient or surrogate, discussions with consultants, evaluation of patient's response to treatment, examination of patient, ordering and review of laboratory studies, ordering and review of radiographic studies, ordering and performing treatments and interventions, pulse oximetry, re-evaluation of patient's condition and review of old charts   Care discussed with: admitting provider   .Central Line  Date/Time: 05/25/2022 5:26 PM  Performed by: Cristie Hem, MD Authorized by: Cristie Hem, MD   Consent:    Consent  obtained:  Verbal   Consent given by:  Patient   Risks, benefits, and alternatives were discussed: yes     Risks discussed:  Arterial puncture, bleeding, incorrect placement, infection and nerve damage   Alternatives discussed:  No treatment Universal protocol:    Procedure explained and questions answered to patient or proxy's satisfaction: yes     Immediately prior to procedure, a time out was called: yes     Patient identity confirmed:  Verbally with patient and arm band Pre-procedure details:    Hand hygiene: Hand hygiene performed prior to insertion     Sterile barrier technique: All elements of maximal sterile technique followed     Skin preparation:  Chlorhexidine   Skin preparation agent: Skin preparation agent completely dried prior to procedure   Sedation:    Sedation type:  None Anesthesia:    Anesthesia method:  Local infiltration   Local anesthetic:  Lidocaine 1% w/o epi Procedure details:    Location:  R femoral   Patient position:  Supine   Procedural supplies:  Triple lumen   Catheter size:  7 Fr   Ultrasound guidance: yes     Ultrasound guidance timing: prior to insertion and real time     Sterile ultrasound techniques: Sterile gel and sterile probe covers were used     Number of attempts:  1   Successful placement: yes   Post-procedure details:    Post-procedure:  Dressing applied and line sutured   Assessment:  Blood return through all ports and free fluid flow   Procedure completion:  Tolerated well, no immediate complications     Cristie Hem, MD 05/25/22 1731

## 2022-05-25 NOTE — ED Provider Notes (Signed)
[facility] Provider Note   CSN: [id number] Arrival date & time: 05/25/22  0901     History  Chief Complaint  Patient presents with   Wound Check    Courtney Gray is a 52 y.o. female history includes bilateral AKA, ESRD M/W/F last dialysis Friday, morbid obesity, hypertension, hyperlipidemia, diabetes, CAD, GERD, CHF, gout.   Patient presented via EMS today from [facility] facility for bleeding of a right flank pressure ulcer.  Patient reports that she has had this pressure ulcer for quite some time and often has a small amount of drainage but has never bled.  She woke up this morning and noticed that her gown and bedsheet was covered with blood, nursing facility staff had difficulty controlling bleeding so they called EMS.  EMS reports no significant bleeding after their arrival they controlled the bleeding with an abdominal pad and direct pressure.  Patient is on Eliquis.  She reports a mild pain to the area that only occurs with palpation, pain improves with rest, pain does not radiate.  She does report a second pressure ulcer to her left anterior lower abdomen which is being managed by nursing facility with dressing changes, areas of draining and does not cause pain.  She denies fever, chills, fall, injury, chest pain/shortness of breath or any additional concerns.  HPI     Home Medications Prior to Admission medications   Medication Sig Start Date End Date Taking? Authorizing Provider  acetaminophen (TYLENOL) 500 MG tablet Take 1,000 mg by mouth as needed for moderate pain.    [provider]  aspirin EC 81 MG tablet Take 1 tablet (81 mg total) by mouth daily. Swallow whole. 05/16/22   [place] Eric J, DO  atorvastatin (LIPITOR) 10 MG tablet Take 1 tablet (10 mg total) by mouth daily. Patient taking differently: Take 10 mg by mouth at bedtime. 11/28/20   Roxan Hockey, MD  calcium carbonate (TUMS SMOOTHIES) 750 MG  chewable tablet Chew 1,500 mg by mouth See admin instructions. Take 1500 mg with each meal and 1500 mg with each snack    [provider]  Darbepoetin Alfa (ARANESP) 200 MCG/0.4ML SOSY injection Inject 0.4 mLs (200 mcg total) into the skin every Tuesday at 6 PM. 05/20/22   [place] Donnamarie Poag, DO  docusate sodium (COLACE) 100 MG capsule Take 100 mg by mouth 2 (two) times daily.    [provider]  famotidine (PEPCID) 20 MG tablet Take 1 tablet (20 mg total) by mouth daily. 05/16/22   [place] Donnamarie Poag, DO  fentaNYL (DURAGESIC) 25 MCG/HR Place 1 patch onto the skin every 3 (three) days. 05/15/22 06/14/22  [place] Donnamarie Poag, DO  gabapentin (NEURONTIN) 100 MG capsule Take 1 capsule (100 mg total) by mouth at bedtime. 05/15/22   [place] Eric J, DO  lactulose (CHRONULAC) 10 GM/15ML solution Take 45 mLs (30 g total) by mouth 2 (two) times daily as needed for mild constipation. 05/15/22   [place] Donnamarie Poag, DO  levothyroxine (SYNTHROID) 100 MCG tablet Take 100 mcg by mouth daily.    [provider]  loperamide (IMODIUM A-D) 2 MG tablet Take 4 mg by mouth as needed for diarrhea or loose stools.    [provider]  loratadine (CLARITIN) 10 MG tablet Take 1 tablet (10 mg total) by mouth daily as needed for allergies. 05/15/22   [place] Donnamarie Poag, DO  midodrine (PROAMATINE) 10 MG tablet Take 1 tablet (10 mg total) by mouth  every Monday, Wednesday, and Friday at 8 PM. Before hemodialysis 05/16/22   [place] Donnamarie Poag, DO  nitroGLYCERIN (NITROSTAT) 0.4 MG SL tablet Place 1 tablet (0.4 mg total) under the tongue every 5 (five) minutes as needed for chest pain. 05/24/21   [place] Donnamarie Poag, DO  Nystatin (GERHARDT'S BUTT CREAM) CREA Apply 1 Application topically 2 (two) times daily. 05/15/22   [place] Donnamarie Poag, DO  oxyCODONE (OXY IR/ROXICODONE) 5 MG immediate release tablet Take 1-2 tablets (5-10 mg total) by mouth every 4 (four) hours as needed for moderate pain (pain score 4-6). 05/15/22   [place] Donnamarie Poag, DO  OXYGEN  Inhale 4 L into the lungs See admin instructions. Oxygen at 4L hr via nasal cannula and will use portable oxygen machine when up in wheelchair. 10/28/21   [provider]  pantoprazole (PROTONIX) 40 MG tablet Take 1 tablet (40 mg total) by mouth daily. 11/29/20   Roxan Hockey, MD  polyethylene glycol (MIRALAX / GLYCOLAX) 17 g packet Take 17 g by mouth daily as needed for mild constipation. 05/15/22   [place] Donnamarie Poag, DO  polyvinyl alcohol (LIQUIFILM TEARS) 1.4 % ophthalmic solution Place 1 drop into both eyes as needed for dry eyes. 05/15/22   [place] Donnamarie Poag, DO  pregabalin (LYRICA) 50 MG capsule Take 1 capsule (50 mg total) by mouth daily. 05/16/22   [place] Donnamarie Poag, DO  sevelamer carbonate (RENVELA) 2.4 g PACK Take 4.8 g by mouth 3 (three) times daily with meals. 05/15/22   [place] Donnamarie Poag, DO  simethicone (MYLICON) 0000000 MG chewable tablet Chew 125 mg by mouth in the morning and at bedtime.    [provider]  sodium chloride 0.9 % SOLN 100 mL with sodium thiosulfate 250 MG/ML SOLN 12.5 g Inject 12.5 g into the vein every Monday, Wednesday, and Friday with hemodialysis. 05/16/22   [place] Donnamarie Poag, DO  sucroferric oxyhydroxide (VELPHORO) 500 MG chewable tablet Chew 2 tablets (1,000 mg total) by mouth 3 (three) times daily with meals. 05/15/22   [place] Donnamarie Poag, DO      Allergies    Benzonatate, Hydralazine, Amoxicillin, Clonidine, Chlorhexidine, Morphine, and Oxycodone    Review of Systems   Review of Systems Ten systems are reviewed and are negative for acute change except as noted in the HPI  Physical Exam Updated Vital Signs BP (!) 132/46   Pulse (!) 55   Temp 97.7 F (36.5 C) (Oral)   Resp 16   SpO2 100%  Physical Exam Constitutional:      General: She is not in acute distress.    Appearance: She is well-developed. She is obese. She is not ill-appearing or toxic-appearing.  HENT:     Head: Normocephalic and atraumatic.     Jaw: There is normal jaw occlusion.     Nose:  Nose normal.  Eyes:     General: Vision grossly intact. Gaze aligned appropriately.     Conjunctiva/sclera: Conjunctivae normal.  Neck:     Trachea: Trachea and phonation normal.  Pulmonary:     Effort: Pulmonary effort is normal. No respiratory distress.     Breath sounds: Normal air entry.  Musculoskeletal:     Cervical back: Normal range of motion.  Skin:         Comments: 1.  Stage II pressure ulcer of the right flank.  Wound is approximately 5 cm in length and 1 cm diameter.  There is a very small subcentimeter area of dried blood to the lateral corner  of the wound.  No active bleeding.  No tunneling.  No erythema.  2. Stage III pressure ulcer of the left lower abdomen.  Packing in place from nursing facility.  No significant drainage.  No surrounding erythema.  No significant tenderness.  Neurological:     Mental Status: She is alert.  Psychiatric:        Behavior: Behavior is cooperative.     ED Results / Procedures / Treatments   Labs (all labs ordered are listed, but only abnormal results are displayed) Labs Reviewed - No data to display  EKG None  Radiology No results found.  Procedures Procedures    Medications Ordered in ED Medications - No data to display  ED Course/ Medical Decision Making/ A&P Clinical Course as of 05/25/22 1158  Sun May 25, 2022  0934 hemostatic [BM]  1042 Hemostatic, dressing clean [BM]    Clinical Course User Index [BM] Gari Crown                             Medical Decision Making 52 year old female history as above presented for bleeding pressure ulcer of the right flank that began this morning.  On arrival to the ER the wound is hemostatic.  She has a approximately 5 x 1 cm stage II pressure wound of the right flank with a pinpoint area of dried blood to the corner of the wound.  We dressed the area with some bacitracin ointment and sterile bandage.  Patient was monitored for around 2-1/2 hours and she had no  bleedthrough her bandaging.  No indication for further ER workup or intervention.  Patient appears stable for return back to her facility.  She reports that she has wound care at her facility which can continue monitoring the areas.  If bleeding returns patient may return to the emergency department.  She denies any chest pain, shortness of breath, no indication for labs at this point.  She has no additional complaints or concerns.  She does have a few other pressure ulcers most notably to the left anterior abdomen which is being managed by the wound care team at her facility.  No change symptoms.    At this time there does not appear to be any evidence of an acute emergency medical condition and the patient appears stable for discharge with appropriate outpatient follow up. Diagnosis was discussed with patient who verbalizes understanding of care plan and is agreeable to discharge. I have discussed return precautions with patient who verbalizes understanding. Patient encouraged to follow-up with their PCP. All questions answered.  Patient's case discussed with Dr. Sabra Heck who agrees with plan to discharge with follow-up.   Note: Portions of this report may have been transcribed using voice recognition software. Every effort was made to ensure accuracy; however, inadvertent computerized transcription errors may still be present.         Final Clinical Impression(s) / ED Diagnoses Final diagnoses:  Visit for wound check    Rx / DC Orders ED Discharge Orders     None         Gari Crown 05/25/22 1158    Noemi Chapel, MD 05/25/22 1438

## 2022-05-25 NOTE — ED Notes (Signed)
Gillis Santa RN running the [facility].

## 2022-05-25 NOTE — ED Notes (Signed)
[facility] called- transporting pt back to [facility] today.

## 2022-05-25 NOTE — Consult Note (Signed)
Renal Service Consult Note [facility]  Courtney Gray 05/25/2022 [name], MD Requesting Physician: Dr. Trilby Drummer  Reason for Consult: ESRD pt w/ bleeding abdominal wounds HPI: The patient is a 52 y.o. year-old w/ PMH as below who presented to ED for an actively bleeding L lower abdomen chronic wound. She had been seen in ED earlier for a bleeding R flank wound, was dc'd and returned w/ new bleeding from the L lower quadrant skin wound. Pt w/ hx of calciphylaxis. Gen surg was consulted. The pt is on eliquis. BP's dropped into the 50s in the ED. Pt was seen by gen surg and they found venous bleeding and used absorbable suture placement to control the bleeding. The pt rec'd 2units prbc's, and there is a 3rd unit in the room to be used if there is further acute bleeding. BPs are back to normal. We are asked to see for ESRD.    Pt seen in ED room.  She has not missed her HD appts. She is feeling much better after bleeding stopped and prbc's transfused.    ROS - denies CP, no joint pain, no HA, no blurry vision, no rash, no diarrhea, no nausea/ vomiting, no dysuria, no difficulty voiding   Past Medical History  Past Medical History:  Diagnosis Date   Arthritis    Bacteremia    Diabetes mellitus without complication ([place])    Dialysis complication, subsequent encounter    ESRD (end stage renal disease) ([place])    Gout    High cholesterol    Hypertension    Hypertensive emergency    Ischemic foot pain at rest 08/03/2021   Renal disorder    Past Surgical History  Past Surgical History:  Procedure Laterality Date   ABDOMINAL AORTOGRAM W/LOWER EXTREMITY N/A 08/07/2021   Procedure: ABDOMINAL AORTOGRAM W/LOWER EXTREMITY;  Surgeon: Broadus John, MD;  Location: [facility];  Service: Cardiovascular;  Laterality: N/A;   AMPUTATION Right 08/14/2021   Procedure: AMPUTATION ABOVE KNEE;  Surgeon: Marty Heck, MD;  Location: [facility];  Service: Vascular;   Laterality: Right;   AMPUTATION Left 09/23/2021   Procedure: AMPUTATION MIDDLE FINGER;  Surgeon: Sherilyn Cooter, MD;  Location: [facility];  Service: Orthopedics;  Laterality: Left;   AMPUTATION Left 10/09/2021   Procedure: LEFT ABOVE KNEE AMPUTATION;  Surgeon: Cherre Robins, MD;  Location: [facility] OR;  Service: Vascular;  Laterality: Left;   AV FISTULA PLACEMENT     x2, unsuccessful.    BUBBLE STUDY  09/27/2021   Procedure: BUBBLE STUDY;  Surgeon: Lelon Perla, MD;  Location: [facility] ENDOSCOPY;  Service: Cardiovascular;;   IR FLUORO GUIDE CV LINE LEFT  12/14/2020   IR FLUORO GUIDE CV LINE LEFT  02/20/2021   IR FLUORO GUIDE CV LINE LEFT  09/11/2021   IR FLUORO GUIDE CV LINE LEFT  10/18/2021   IR FLUORO GUIDE CV LINE LEFT  11/26/2021   IR FLUORO GUIDE CV LINE LEFT  11/28/2021   IR FLUORO GUIDE CV LINE LEFT  02/04/2022   IR FLUORO GUIDE CV LINE LEFT  03/07/2022   IR FLUORO GUIDE CV LINE RIGHT  08/28/2020   IR FLUORO GUIDE CV LINE RIGHT  05/13/2021   IR FLUORO GUIDE CV LINE RIGHT  05/24/2021   IR INTRAVASCULAR ULTRASOUND NON CORONARY  03/07/2022   IR PERC TUN PERIT CATH WO PORT S&I /IMAG  09/06/2020   IR PTA VENOUS EXCEPT DIALYSIS CIRCUIT  02/20/2021   IR PTA VENOUS EXCEPT DIALYSIS  CIRCUIT  05/13/2021   IR PTA VENOUS EXCEPT DIALYSIS CIRCUIT  11/28/2021   IR PTA VENOUS EXCEPT DIALYSIS CIRCUIT  03/07/2022   IR REMOVAL TUN CV CATH W/O FL  05/24/2021   IR REMOVAL TUN CV CATH W/O FL  10/15/2021   IR US GUIDE VASC ACCESS LEFT  09/06/2020   IR US GUIDE VASC ACCESS LEFT  09/11/2021   IR US GUIDE VASC ACCESS LEFT  10/18/2021   IR US GUIDE VASC ACCESS RIGHT  05/24/2021   IR US GUIDE VASC ACCESS RIGHT  09/10/2021   IR VENO/JUGULAR RIGHT  09/10/2021   IR VENOCAVAGRAM SVC  02/20/2021   IR VENOCAVAGRAM SVC  11/26/2021   IR VENOCAVAGRAM SVC  03/07/2022   TEE WITHOUT CARDIOVERSION N/A 09/27/2021   Procedure: TRANSESOPHAGEAL ECHOCARDIOGRAM (TEE);  Surgeon: Lelon Perla, MD;  Location: [facility];  Service: Cardiovascular;   Laterality: N/A;   UPPER EXTREMITY VENOGRAPHY Right 12/04/2021   Procedure: UPPER EXTREMITY VENOGRAPHY;  Surgeon: Broadus John, MD;  Location: [facility];  Service: Cardiovascular;  Laterality: Right;   Family History  Family History  Problem Relation Age of Onset   Diabetes Mother    Diabetes Father    Social History  reports that she has never smoked. She has never used smokeless tobacco. She reports that she does not drink alcohol and does not use drugs. Allergies  Allergies  Allergen Reactions   Benzonatate Other (See Comments)    Hallucinations   Hydralazine Other (See Comments)    Hallucinations   Amoxicillin Itching and Nausea And Vomiting   Clonidine Other (See Comments)    Altered mental state, insomnia    Chlorhexidine Itching    Patient reports it is the CHG baths that cause her itching not the CHG used for caring for her IV/HD access.     Morphine Itching   Oxycodone Itching    Pt tolerated hospital admission 07/2021   Home medications Prior to Admission medications   Medication Sig Start Date End Date Taking? Authorizing Provider  acetaminophen (TYLENOL) 500 MG tablet Take 1,000 mg by mouth as needed for moderate pain.    [provider]  aspirin EC 81 MG tablet Take 1 tablet (81 mg total) by mouth daily. Swallow whole. 05/16/22   [place] Eric J, DO  atorvastatin (LIPITOR) 10 MG tablet Take 1 tablet (10 mg total) by mouth daily. Patient taking differently: Take 10 mg by mouth at bedtime. 11/28/20   Roxan Hockey, MD  calcium carbonate (TUMS SMOOTHIES) 750 MG chewable tablet Chew 1,500 mg by mouth See admin instructions. Take 1500 mg with each meal and 1500 mg with each snack    [provider]  Darbepoetin Alfa (ARANESP) 200 MCG/0.4ML SOSY injection Inject 0.4 mLs (200 mcg total) into the skin every Tuesday at 6 PM. 05/20/22   [place] Donnamarie Poag, DO  docusate sodium (COLACE) 100 MG capsule Take 100 mg by mouth 2 (two) times daily.    [provider]  famotidine (PEPCID) 20 MG tablet Take 1 tablet (20 mg total) by mouth daily. 05/16/22   [place] Donnamarie Poag, DO  fentaNYL (DURAGESIC) 25 MCG/HR Place 1 patch onto the skin every 3 (three) days. 05/15/22 06/14/22  [place] Donnamarie Poag, DO  gabapentin (NEURONTIN) 100 MG capsule Take 1 capsule (100 mg total) by mouth at bedtime. 05/15/22   [place] Eric J, DO  lactulose (CHRONULAC) 10 GM/15ML solution Take 45 mLs (30 g total) by mouth 2 (two) times daily as  needed for mild constipation. 05/15/22   [place] Donnamarie Poag, DO  levothyroxine (SYNTHROID) 100 MCG tablet Take 100 mcg by mouth daily.    [provider]  loperamide (IMODIUM A-D) 2 MG tablet Take 4 mg by mouth as needed for diarrhea or loose stools.    [provider]  loratadine (CLARITIN) 10 MG tablet Take 1 tablet (10 mg total) by mouth daily as needed for allergies. 05/15/22   [place] Donnamarie Poag, DO  midodrine (PROAMATINE) 10 MG tablet Take 1 tablet (10 mg total) by mouth every Monday, Wednesday, and Friday at 8 PM. Before hemodialysis 05/16/22   [place] Eric J, DO  nitroGLYCERIN (NITROSTAT) 0.4 MG SL tablet Place 1 tablet (0.4 mg total) under the tongue every 5 (five) minutes as needed for chest pain. 05/24/21   [place] Donnamarie Poag, DO  Nystatin (GERHARDT'S BUTT CREAM) CREA Apply 1 Application topically 2 (two) times daily. 05/15/22   [place] Donnamarie Poag, DO  oxyCODONE (OXY IR/ROXICODONE) 5 MG immediate release tablet Take 1-2 tablets (5-10 mg total) by mouth every 4 (four) hours as needed for moderate pain (pain score 4-6). 05/15/22   [place] Donnamarie Poag, DO  OXYGEN Inhale 4 L into the lungs See admin instructions. Oxygen at 4L hr via nasal cannula and will use portable oxygen machine when up in wheelchair. 10/28/21   [provider]  pantoprazole (PROTONIX) 40 MG tablet Take 1 tablet (40 mg total) by mouth daily. 11/29/20   Roxan Hockey, MD  polyethylene glycol (MIRALAX / GLYCOLAX) 17 g packet Take 17 g by mouth daily as needed for mild  constipation. 05/15/22   [place] Donnamarie Poag, DO  polyvinyl alcohol (LIQUIFILM TEARS) 1.4 % ophthalmic solution Place 1 drop into both eyes as needed for dry eyes. 05/15/22   [place] Donnamarie Poag, DO  pregabalin (LYRICA) 50 MG capsule Take 1 capsule (50 mg total) by mouth daily. 05/16/22   [place] Donnamarie Poag, DO  sevelamer carbonate (RENVELA) 2.4 g PACK Take 4.8 g by mouth 3 (three) times daily with meals. 05/15/22   [place] Donnamarie Poag, DO  simethicone (MYLICON) 0000000 MG chewable tablet Chew 125 mg by mouth in the morning and at bedtime.    [provider]  sodium chloride 0.9 % SOLN 100 mL with sodium thiosulfate 250 MG/ML SOLN 12.5 g Inject 12.5 g into the vein every Monday, Wednesday, and Friday with hemodialysis. 05/16/22   [place] Donnamarie Poag, DO  sucroferric oxyhydroxide (VELPHORO) 500 MG chewable tablet Chew 2 tablets (1,000 mg total) by mouth 3 (three) times daily with meals. 05/15/22   [place] Eric J, DO     Vitals:   05/25/22 1930 05/25/22 1945 05/25/22 2010 05/25/22 2015  BP: (!) 122/47 120/84  101/63  Pulse: 69   62  Resp: 15 14  18  $ Temp:   [id number] F (36.6 C)   TempSrc:   Oral   SpO2: 100%   100%   Exam Gen alert, no distress, 2L [place] O2 No rash, cyanosis or gangrene Sclera anicteric, throat clear  No jvd or bruits Chest clear bilat to bases, no rales/ wheezing RRR no MRG Abd soft ntnd no mass or ascites +bs Abd pannus w/ LLQ dressing over the skin wounds GU defer MS bilateral AKA stumps Ext no LE or UE edema, no wounds or ulcers Neuro is alert, Ox 3 , nf    LIJ TDC intact     Home meds include - aspirin, lipitor, colace, pepcid, lactulose, synthroid, midodrine 10 mg pre  hd mwf, 4L [place] chronic home O2, protonix, fentanyl patch 25 ug q 3d, gabapentin 100 hs, oxy IR prn, lyrica, renvela 4.8gm ac tid, velphoro 1 gm ac tid, prns/ vits/ supps     OP HD: MWF SW 4h  400/1.5   116kg  2/2 bath  Hep 5000  LIJ TDC - last HD 2/16, post wt 117.2kg   - sodium thiosulfate 12.5 gm IV tiw - venofer  159m each HD 2/21 thru 3/13 - mircera 225 mcg IV q 2wks, last on 2/14, due on 2/28  Assessment/ Plan: Acute bleeding w/ shock - from LLQ skin wounds, pt is on eliquis, hx calciphylaxis causing bilat abd wounds. Seen by gen surgery and the bleeding wound was sutured. Pt also sp prbc's x 2.   Hypotension - acute, temporary, appears to have resolve ESRD - on HD MWF. Has not missed any HD. Plan HD tomorrow.  Hyperkalemia - K+ 5.5. will give 10 gm lokelma. Renal diet if eating. HD in am.  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 Just got esa on 2/14, next due on 2/28. 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 Cont binders.  PAD sp bilat AKA Calciphylaxis - suspected cause of her abdominal wounds, cont Na thiosulfate w/ hd      RKelly Splinter MD [facility] 05/25/2022, 8:41 PM  Recent Labs  Lab 05/25/22 1436 05/25/22 1707  HGB 8.9* 9.9*  CALCIUM 9.5  --   CREATININE 7.56* 8.70*  K 5.4* 5.5*   Inpatient medications:  [START ON 05/26/2022] aspirin EC  81 mg Oral Daily   atorvastatin  10 mg Oral QHS   [START ON 05/26/2022] insulin aspart  0-6 Units Subcutaneous TID WC   [START ON 05/26/2022] levothyroxine  100 mcg Oral Daily   [START ON 05/26/2022] midodrine  10 mg Oral Q M,W,F-2000   [START ON 05/26/2022] pantoprazole  40 mg Oral Daily   sodium chloride flush  3 mL Intravenous Q12H   sodium zirconium cyclosilicate  5 g Oral Once    acetaminophen **OR** acetaminophen, loperamide, oxyCODONE, polyethylene glycol, polyvinyl alcohol, simethicone

## 2022-05-25 NOTE — ED Notes (Signed)
Pharmacist notified on patient's Lokema not available at [facility] , admitting MD notified on patient's request for Oxycodone .

## 2022-05-25 NOTE — Consult Note (Signed)
Courtney Gray 04-17-1970  [id number].    Requesting MD: Dr. Noemi Chapel Chief Complaint/Reason for Consult: bleeding wound  HPI:  Courtney Gray is a 52 yo female who was seen in the ED this morning for bleeding from a right flank wound. She was discharged and returned this afternoon with new bleeding from a LLQ abdominal wall wound. Manual pressure was held and suture ligation attempted but significant bleeding persisted. General surgery was consulted. The patient is on Eliquis and took her last dose this morning.  ROS: ROS  Family History  Problem Relation Age of Onset   Diabetes Mother    Diabetes Father     Past Medical History:  Diagnosis Date   Arthritis    Diabetes mellitus without complication ([place])    Dialysis complication, subsequent encounter    ESRD (end stage renal disease) ([place])    Gout    High cholesterol    Hypertension    Renal disorder     Past Surgical History:  Procedure Laterality Date   ABDOMINAL AORTOGRAM W/LOWER EXTREMITY N/A 08/07/2021   Procedure: ABDOMINAL AORTOGRAM W/LOWER EXTREMITY;  Surgeon: Broadus John, MD;  Location: [place] [facility];  Service: Cardiovascular;  Laterality: N/A;   AMPUTATION Right 08/14/2021   Procedure: AMPUTATION ABOVE KNEE;  Surgeon: Marty Heck, MD;  Location: [place];  Service: Vascular;  Laterality: Right;   AMPUTATION Left 09/23/2021   Procedure: AMPUTATION MIDDLE FINGER;  Surgeon: Sherilyn Cooter, MD;  Location: [place];  Service: Orthopedics;  Laterality: Left;   AMPUTATION Left 10/09/2021   Procedure: LEFT ABOVE KNEE AMPUTATION;  Surgeon: Cherre Robins, MD;  Location: [facility];  Service: Vascular;  Laterality: Left;   AV FISTULA PLACEMENT     x2, unsuccessful.    BUBBLE STUDY  09/27/2021   Procedure: BUBBLE STUDY;  Surgeon: Lelon Perla, MD;  Location: [place];  Service: Cardiovascular;;   IR FLUORO GUIDE CV LINE LEFT  12/14/2020   IR FLUORO GUIDE CV LINE LEFT  02/20/2021   IR FLUORO GUIDE CV LINE  LEFT  09/11/2021   IR FLUORO GUIDE CV LINE LEFT  10/18/2021   IR FLUORO GUIDE CV LINE LEFT  11/26/2021   IR FLUORO GUIDE CV LINE LEFT  11/28/2021   IR FLUORO GUIDE CV LINE LEFT  02/04/2022   IR FLUORO GUIDE CV LINE LEFT  03/07/2022   IR FLUORO GUIDE CV LINE RIGHT  08/28/2020   IR FLUORO GUIDE CV LINE RIGHT  05/13/2021   IR FLUORO GUIDE CV LINE RIGHT  05/24/2021   IR INTRAVASCULAR ULTRASOUND NON CORONARY  03/07/2022   IR PERC TUN PERIT CATH WO PORT S&I /IMAG  09/06/2020   IR PTA VENOUS EXCEPT DIALYSIS CIRCUIT  02/20/2021   IR PTA VENOUS EXCEPT DIALYSIS CIRCUIT  05/13/2021   IR PTA VENOUS EXCEPT DIALYSIS CIRCUIT  11/28/2021   IR PTA VENOUS EXCEPT DIALYSIS CIRCUIT  03/07/2022   IR REMOVAL TUN CV CATH W/O FL  05/24/2021   IR REMOVAL TUN CV CATH W/O FL  10/15/2021   IR US GUIDE VASC ACCESS LEFT  09/06/2020   IR US GUIDE VASC ACCESS LEFT  09/11/2021   IR US GUIDE VASC ACCESS LEFT  10/18/2021   IR US GUIDE VASC ACCESS RIGHT  05/24/2021   IR US GUIDE VASC ACCESS RIGHT  09/10/2021   IR VENO/JUGULAR RIGHT  09/10/2021   IR VENOCAVAGRAM SVC  02/20/2021   IR VENOCAVAGRAM SVC  11/26/2021   IR VENOCAVAGRAM SVC  03/07/2022  TEE WITHOUT CARDIOVERSION N/A 09/27/2021   Procedure: TRANSESOPHAGEAL ECHOCARDIOGRAM (TEE);  Surgeon: Lelon Perla, MD;  Location: [place];  Service: Cardiovascular;  Laterality: N/A;   UPPER EXTREMITY VENOGRAPHY Right 12/04/2021   Procedure: UPPER EXTREMITY VENOGRAPHY;  Surgeon: Broadus John, MD;  Location: [place] [facility];  Service: Cardiovascular;  Laterality: Right;    Social History:  reports that she has never smoked. She has never used smokeless tobacco. She reports that she does not drink alcohol and does not use drugs.  Allergies:  Allergies  Allergen Reactions   Benzonatate Other (See Comments)    Hallucinations   Hydralazine Other (See Comments)    Hallucinations   Amoxicillin Itching and Nausea And Vomiting   Clonidine Other (See Comments)    Altered mental state, insomnia     Chlorhexidine Itching    Patient reports it is the CHG baths that cause her itching not the CHG used for caring for her IV/HD access.     Morphine Itching   Oxycodone Itching    Pt tolerated hospital admission 07/2021    (Not in a hospital admission)    Physical Exam: There were no vitals taken for this visit. General: resting in bed, NAD Neuro: alert, no focal deficits Resp: normal work of breathing CV: RRR Abdomen: wound on LLQ abdominal wall, approximately 4cm in diameter, with friable tissue and venous bleeding at the inferior superficial edge.  Procedure: A 3-0 Vicryl suture was used to place two deep figure-of-eight sutures across each of the two bleeding vessels within the wound. Hemostasis was achieved. The patient tolerated well with no apparent complications.   Assessment/Plan 52 yo female with ESRD on HD, presenting with bleeding from a chronic abdominal wall. Venous bleeding was noted on exam and controlled with absorbable suture placement. Continue local wound care. Please call with any further questions or concerns.  Michaelle Birks, MD [facility] Surgery General, Hepatobiliary and Pancreatic Surgery 05/25/22 3:05 PM

## 2022-05-25 NOTE — ED Notes (Signed)
Pt was rolled & bloody bed sheets changed, tolerated that well.

## 2022-05-25 NOTE — H&P (Addendum)
History and Physical   Courtney Gray [id number] DOB: 1970-04-08 DOA: 05/25/2022  PCP: Monico Blitz, MD   Patient coming from: [facility]  Chief Complaint: Wound bleeding  HPI: Courtney Gray is a 52 y.o. female with medical history significant of diabetes, IBS, hypertension, GERD, ESRD on HD, chronic respiratory failure, anemia, diastolic CHF, status post bilateral AKA, PAD status post splenic infarct, obesity, CAD, hyperlipidemia presenting with wound issues.  Patient seen earlier this morning for the same and has had issues with the left anterior lower abdominal wound.  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  Patient appears to be stable during earlier evaluation in the ED after packing had no recurrent bleeding after some mild bleeding prior to presentation.  After arriving back at facility she did have significant recurrent bleeding at this left lower anterior abdomen and presented back to the ED.  This time ED provider needed to attempt suture ligation which was unsuccessful and general surgery was called.  They were able to successfully ligate with a suture.  Patient denies fevers, chills, chest pain, shortness of breath, constipation, diarrhea, nausea, vomiting.  ED Course: Vital signs in ED significant for blood pressure in the 110s to 140s.  Heart rate in the 50s to 60s.  Chronically on 2 to 4 L.  Lab workup included BMP with potassium 5.4, chloride 95, BUN 38, creatinine 7.56, gap 18, glucose 108 in the setting of ESRD.  CBC with hemoglobin stable at 8.9.  PT and INR borderline elevated at 15.91.3 respectively.  Type and screen performed.  No medications given and general surgery consulted for assistance with ligation as above.  Admission requested for overnight observation to monitor hemoglobin given degree of bleeding and to ensure sustained hemostasis.  Prior to my evaluation of patient, patient became significantly  hypotensive in the 123456 systolic and then by cuff was 40 systolic.  Had decreased responsiveness during this time as well.  Concern was for symptomatic anemia and an accurate recent hemoglobin.  Patient was emergently transfused 2 units PRBCs and central line was placed.  Patient coming around with blood pressure improving after at least 1 unit already infused by the time I evaluated patient.  Case to be discussed with critical care to consider close monitoring overnight.  Review of Systems: As per HPI otherwise all other systems reviewed and are negative.  Past Medical History:  Diagnosis Date   Arthritis    Bacteremia    Diabetes mellitus without complication ([place])    Dialysis complication, subsequent encounter    ESRD (end stage renal disease) ([place])    Gout    High cholesterol    Hypertension    Hypertensive emergency    Ischemic foot pain at rest 08/03/2021   Renal disorder     Past Surgical History:  Procedure Laterality Date   ABDOMINAL AORTOGRAM W/LOWER EXTREMITY N/A 08/07/2021   Procedure: ABDOMINAL AORTOGRAM W/LOWER EXTREMITY;  Surgeon: Broadus John, MD;  Location: [place] [facility];  Service: Cardiovascular;  Laterality: N/A;   AMPUTATION Right 08/14/2021   Procedure: AMPUTATION ABOVE KNEE;  Surgeon: Marty Heck, MD;  Location: [place];  Service: Vascular;  Laterality: Right;   AMPUTATION Left 09/23/2021   Procedure: AMPUTATION MIDDLE FINGER;  Surgeon: Sherilyn Cooter, MD;  Location: [place];  Service: Orthopedics;  Laterality: Left;   AMPUTATION Left 10/09/2021   Procedure: LEFT ABOVE KNEE AMPUTATION;  Surgeon: Cherre Robins, MD;  Location: [place];  Service: Vascular;  Laterality: Left;   AV FISTULA PLACEMENT     x2, unsuccessful.    BUBBLE STUDY  09/27/2021   Procedure: BUBBLE STUDY;  Surgeon: Lelon Perla, MD;  Location: [facility];  Service: Cardiovascular;;   IR FLUORO GUIDE CV LINE LEFT  12/14/2020   IR FLUORO GUIDE CV LINE LEFT  02/20/2021   IR FLUORO  GUIDE CV LINE LEFT  09/11/2021   IR FLUORO GUIDE CV LINE LEFT  10/18/2021   IR FLUORO GUIDE CV LINE LEFT  11/26/2021   IR FLUORO GUIDE CV LINE LEFT  11/28/2021   IR FLUORO GUIDE CV LINE LEFT  02/04/2022   IR FLUORO GUIDE CV LINE LEFT  03/07/2022   IR FLUORO GUIDE CV LINE RIGHT  08/28/2020   IR FLUORO GUIDE CV LINE RIGHT  05/13/2021   IR FLUORO GUIDE CV LINE RIGHT  05/24/2021   IR INTRAVASCULAR ULTRASOUND NON CORONARY  03/07/2022   IR PERC TUN PERIT CATH WO PORT S&I /IMAG  09/06/2020   IR PTA VENOUS EXCEPT DIALYSIS CIRCUIT  02/20/2021   IR PTA VENOUS EXCEPT DIALYSIS CIRCUIT  05/13/2021   IR PTA VENOUS EXCEPT DIALYSIS CIRCUIT  11/28/2021   IR PTA VENOUS EXCEPT DIALYSIS CIRCUIT  03/07/2022   IR REMOVAL TUN CV CATH W/O FL  05/24/2021   IR REMOVAL TUN CV CATH W/O FL  10/15/2021   IR US GUIDE VASC ACCESS LEFT  09/06/2020   IR US GUIDE VASC ACCESS LEFT  09/11/2021   IR US GUIDE VASC ACCESS LEFT  10/18/2021   IR US GUIDE VASC ACCESS RIGHT  05/24/2021   IR US GUIDE VASC ACCESS RIGHT  09/10/2021   IR VENO/JUGULAR RIGHT  09/10/2021   IR VENOCAVAGRAM SVC  02/20/2021   IR VENOCAVAGRAM SVC  11/26/2021   IR VENOCAVAGRAM SVC  03/07/2022   TEE WITHOUT CARDIOVERSION N/A 09/27/2021   Procedure: TRANSESOPHAGEAL ECHOCARDIOGRAM (TEE);  Surgeon: Lelon Perla, MD;  Location: [place];  Service: Cardiovascular;  Laterality: N/A;   UPPER EXTREMITY VENOGRAPHY Right 12/04/2021   Procedure: UPPER EXTREMITY VENOGRAPHY;  Surgeon: Broadus John, MD;  Location: [place] [facility];  Service: Cardiovascular;  Laterality: Right;    Social History  reports that she has never smoked. She has never used smokeless tobacco. She reports that she does not drink alcohol and does not use drugs.  Allergies  Allergen Reactions   Benzonatate Other (See Comments)    Hallucinations   Hydralazine Other (See Comments)    Hallucinations   Amoxicillin Itching and Nausea And Vomiting   Clonidine Other (See Comments)    Altered mental state,  insomnia    Chlorhexidine Itching    Patient reports it is the CHG baths that cause her itching not the CHG used for caring for her IV/HD access.     Morphine Itching   Oxycodone Itching    Pt tolerated hospital admission 07/2021    Family History  Problem Relation Age of Onset   Diabetes Mother    Diabetes Father   Reviewed on admission  Prior to Admission medications   Medication Sig Start Date End Date Taking? Authorizing Provider  acetaminophen (TYLENOL) 500 MG tablet Take 1,000 mg by mouth as needed for moderate pain.    [provider]  aspirin EC 81 MG tablet Take 1 tablet (81 mg total) by mouth daily. Swallow whole. 05/16/22   [place] Eric J, DO  atorvastatin (LIPITOR) 10 MG tablet Take 1 tablet (10 mg total) by mouth  daily. Patient taking differently: Take 10 mg by mouth at bedtime. 11/28/20   Roxan Hockey, MD  calcium carbonate (TUMS SMOOTHIES) 750 MG chewable tablet Chew 1,500 mg by mouth See admin instructions. Take 1500 mg with each meal and 1500 mg with each snack    [provider]  Darbepoetin Alfa (ARANESP) 200 MCG/0.4ML SOSY injection Inject 0.4 mLs (200 mcg total) into the skin every Tuesday at 6 PM. 05/20/22   [place] Donnamarie Poag, DO  docusate sodium (COLACE) 100 MG capsule Take 100 mg by mouth 2 (two) times daily.    [provider]  famotidine (PEPCID) 20 MG tablet Take 1 tablet (20 mg total) by mouth daily. 05/16/22   [place] Donnamarie Poag, DO  fentaNYL (DURAGESIC) 25 MCG/HR Place 1 patch onto the skin every 3 (three) days. 05/15/22 06/14/22  [place] Donnamarie Poag, DO  gabapentin (NEURONTIN) 100 MG capsule Take 1 capsule (100 mg total) by mouth at bedtime. 05/15/22   [place] Eric J, DO  lactulose (CHRONULAC) 10 GM/15ML solution Take 45 mLs (30 g total) by mouth 2 (two) times daily as needed for mild constipation. 05/15/22   [place] Donnamarie Poag, DO  levothyroxine (SYNTHROID) 100 MCG tablet Take 100 mcg by mouth daily.    [provider]  loperamide (IMODIUM  A-D) 2 MG tablet Take 4 mg by mouth as needed for diarrhea or loose stools.    [provider]  loratadine (CLARITIN) 10 MG tablet Take 1 tablet (10 mg total) by mouth daily as needed for allergies. 05/15/22   [place] Donnamarie Poag, DO  midodrine (PROAMATINE) 10 MG tablet Take 1 tablet (10 mg total) by mouth every Monday, Wednesday, and Friday at 8 PM. Before hemodialysis 05/16/22   [place] Eric J, DO  nitroGLYCERIN (NITROSTAT) 0.4 MG SL tablet Place 1 tablet (0.4 mg total) under the tongue every 5 (five) minutes as needed for chest pain. 05/24/21   [place] Donnamarie Poag, DO  Nystatin (GERHARDT'S BUTT CREAM) CREA Apply 1 Application topically 2 (two) times daily. 05/15/22   [place] Donnamarie Poag, DO  oxyCODONE (OXY IR/ROXICODONE) 5 MG immediate release tablet Take 1-2 tablets (5-10 mg total) by mouth every 4 (four) hours as needed for moderate pain (pain score 4-6). 05/15/22   [place] Donnamarie Poag, DO  OXYGEN Inhale 4 L into the lungs See admin instructions. Oxygen at 4L hr via nasal cannula and will use portable oxygen machine when up in wheelchair. 10/28/21   [provider]  pantoprazole (PROTONIX) 40 MG tablet Take 1 tablet (40 mg total) by mouth daily. 11/29/20   Roxan Hockey, MD  polyethylene glycol (MIRALAX / GLYCOLAX) 17 g packet Take 17 g by mouth daily as needed for mild constipation. 05/15/22   [place] Donnamarie Poag, DO  polyvinyl alcohol (LIQUIFILM TEARS) 1.4 % ophthalmic solution Place 1 drop into both eyes as needed for dry eyes. 05/15/22   [place] Donnamarie Poag, DO  pregabalin (LYRICA) 50 MG capsule Take 1 capsule (50 mg total) by mouth daily. 05/16/22   [place] Donnamarie Poag, DO  sevelamer carbonate (RENVELA) 2.4 g PACK Take 4.8 g by mouth 3 (three) times daily with meals. 05/15/22   [place] Donnamarie Poag, DO  simethicone (MYLICON) 0000000 MG chewable tablet Chew 125 mg by mouth in the morning and at bedtime.    [provider]  sodium chloride 0.9 % SOLN 100 mL with sodium thiosulfate 250 MG/ML SOLN 12.5 g Inject 12.5 g  into the vein every Monday, Wednesday, and Friday with hemodialysis.  05/16/22   [place] Donnamarie Poag, DO  sucroferric oxyhydroxide (VELPHORO) 500 MG chewable tablet Chew 2 tablets (1,000 mg total) by mouth 3 (three) times daily with meals. 05/15/22   [place] Eric J, DO    Physical Exam: Vitals:   05/25/22 1815 05/25/22 1830 05/25/22 1842 05/25/22 1900  BP: 108/61 122/61  (!) 113/45  Pulse:  (!) 59    Resp: 17 (!) 21  16  Temp:   98 F (36.7 C)   TempSrc:   Oral   SpO2:  100%      Physical Exam Constitutional:      General: She is in acute distress.     Appearance: She is obese. She is ill-appearing.     Comments: Non re-breather in place  HENT:     Head: Normocephalic and atraumatic.     Mouth/Throat:     Mouth: Mucous membranes are moist.     Pharynx: Oropharynx is clear.  Eyes:     Extraocular Movements: Extraocular movements intact.     Pupils: Pupils are equal, round, and reactive to light.  Cardiovascular:     Rate and Rhythm: Normal rate and regular rhythm.     Pulses: Normal pulses.     Heart sounds: Normal heart sounds.  Pulmonary:     Effort: Pulmonary effort is normal. No respiratory distress.     Breath sounds: Normal breath sounds.     Comments: Distant breath sounds Abdominal:     General: Bowel sounds are normal. There is no distension.     Palpations: Abdomen is soft.     Tenderness: There is no abdominal tenderness.     Comments: Multiple abdominal wounds.  Musculoskeletal:        General: No swelling or deformity.     Comments: S/P bilateral AKA. EDP placing CVC at R femoral Vein  Skin:    General: Skin is warm and dry.  Neurological:     General: No focal deficit present.     Mental Status: Mental status is at baseline.    Labs on Admission: I have personally reviewed following labs and imaging studies  CBC: Recent Labs  Lab 05/25/22 1436 05/25/22 1707  WBC 8.5  --   NEUTROABS 6.1  --   HGB 8.9* 9.9*  HCT 29.6* 29.0*  MCV 92.2  --   PLT 176   --     Basic Metabolic Panel: Recent Labs  Lab 05/25/22 1436 05/25/22 1707  NA 137 136  K 5.4* 5.5*  CL 95* 99  CO2 24  --   GLUCOSE 106* 84  BUN 38* 38*  CREATININE 7.56* 8.70*  CALCIUM 9.5  --     GFR: Estimated Creatinine Clearance: 9.7 mL/min (A) (by C-G formula based on SCr of 8.7 mg/dL (H)).  Liver Function Tests: No results for input(s): "AST", "ALT", "ALKPHOS", "BILITOT", "PROT", "ALBUMIN" in the last 168 hours.  Urine analysis: No results found for: "COLORURINE", "APPEARANCEUR", "LABSPEC", "PHURINE", "GLUCOSEU", "HGBUR", "BILIRUBINUR", "KETONESUR", "PROTEINUR", "UROBILINOGEN", "NITRITE", "LEUKOCYTESUR"  Radiological Exams on Admission: No results found.  EKG: Not performed  Assessment/Plan Principal Problem:   Open abdominal wall wound Active Problems:   Morbid obesity with BMI of 60.0-69.9, adult (HCC)   End stage renal disease ([place])   Hypertension   Type 2 diabetes mellitus with hyperlipidemia (HCC)   Chronic respiratory failure with hypoxia (HCC)   Mixed diabetic hyperlipidemia associated with type 2 diabetes mellitus (HCC)   Anemia due to chronic kidney disease   Chronic  diastolic CHF (congestive heart failure) (HCC)   Coronary artery disease involving native coronary artery of native heart without angina pectoris   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HCC)   PAD (peripheral artery disease) (HCC)   GERD (gastroesophageal reflux disease)   Hemorrhagic shock (HCC)   Abdominal wound Hemorrhagic shock > Presented at twice a day with abdominal wound bleeding.  Second time more significant than the first.  Required general surgery assistance with suture ligation to obtain hemostasis in the ED. > Known history of calciphylaxis and pressure wound at the location of the bleeding. > Significant bleeding, hemoglobin appeared to be stable but suspicion for this not yet being accurate due to degree and repetitive bleeding.  > Prior to my evaluation of  patient, patient became significantly hypotensive in the 123456 systolic and then by cuff was 40 systolic.  Had decreased responsiveness during this time as well.  Concern was for symptomatic anemia and an accurate recent hemoglobin.  Patient was emergently transfused 2 units PRBCs and central line was placed.  Patient coming around with blood pressure improving after at least 1 unit already infused by the time I evaluated patient.  > Chart review reveals blood thinner having to be held in the past due to splenic hematoma. - Appreciate critical care recommendations, currently appears stable for progressive unit, but may benefit from closer monitoring given the rapidity with which she decompensated - Monitor on MedSurg unit overnight - Trend hemoglobin - Hold Eliquis, hold off on reversal due to hemostasis, significant PAD and need for maintaining dialysis access  PAD Hyperlipidemia CAD > Status post bilateral AKA - Continue home aspirin, atorvastatin - Holding Eliquis as above  Diabetes - SSI  Hypertension Hypotension - Continue midodrine with HD  ESRD on HD Hyperkalemia > Monday Wednesday Friday schedule, last session was Friday.  Would be due tomorrow, will consult nephrology given that she had to receive 2 units of blood this evening. > Mild hyperkalemia at 5.4. > Nephrology notified of patient admission given her blood transfusion and ESRD status, likely will see in the morning but may see this evening. - Appreciate nephrology's assistance with this patient as needed - 1 dose of Lokelma this evening - Trend renal function and electrolytes  Chronic respiratory failure with hypoxia > Has been on chronic 2 to 4 L in the ED - Continue to monitor  GERD - Continue home PPI  DVT prophylaxis: No legs for SCDs, no DVT prophylaxis given recent significant bleed. Code Status:   Full Family Communication:  Updated at bedside Disposition Plan:   Patient is from:  [place] to:  Same as above  Anticipated DC date:  1 to 4 days  Anticipated DC barriers: None  Consults called:  Nephrology, critical care Admission status:  Inpatient, progressive  Severity of Illness: The appropriate patient status for this patient is INPATIENT. Inpatient status is judged to be reasonable and necessary in order to provide the required intensity of service to ensure the patient's safety. The patient's presenting symptoms, physical exam findings, and initial radiographic and laboratory data in the context of their chronic comorbidities is felt to place them at high risk for further clinical deterioration. Furthermore, it is not anticipated that the patient will be medically stable for discharge from the hospital within 2 midnights of admission.   * I certify that at the point of admission it is my clinical judgment that the patient will require inpatient hospital care spanning beyond 2  midnights from the point of admission due to high intensity of service, high risk for further deterioration and high frequency of surveillance required.Marcelyn Bruins MD [facility]  How to contact the [facility] Attending or Consulting provider [place] or covering provider during after hours [place], for this patient?   Check the care team in [facility] and look for a) attending/consulting [facility] provider listed and b) the [facility] team listed Log into [email or web address] and use [employer]'s universal password to access. If you do not have the password, please contact the hospital operator. Locate the [facility] provider you are looking for under [facility] and page to a number that you can be directly reached. If you still have difficulty reaching the provider, please page the [facility] (Director on Call) for the Hospitalists listed on amion for assistance.  05/25/2022, 7:11 PM

## 2022-05-25 NOTE — Discharge Instructions (Addendum)
At this time there does not appear to be the presence of an emergent medical condition, however there is always the potential for conditions to change. Please read and follow the below instructions.  Please return to the Emergency Department immediately for any new or worsening symptoms. Please be sure to follow up with your Primary Care Provider within one week regarding your visit today; please call their office to schedule an appointment even if you are feeling better for a follow-up visit. Please have your facility frequently monitor your wounds and perform frequent position changes to avoid worsening of your pressure wounds.  Please have the wound care team keep a close eye on the wounds and if any signs of infection develop please return to the emergency department.  Your wound was no longer bleeding by time he arrived to the emergency department you were monitored for around 2-3 hours without recurrence of bleeding.  If you develop any bleeding that does not quickly improve within a few minutes of direct pressure please return to the emergency department.   Please read the additional information packets attached to your discharge summary.  Go to the nearest Emergency Department immediately if: You have fever or chills You have pain that does not get better with medicine. Your skin changes color. You have new blisters or sores. You have signs of infection such as redness, drainage, fever or chills or increased pain. You have bleeding that does not resolve within a few minutes of pressure. You have any new/concerning or worsening of symptoms.  Do not take your medicine if  develop an itchy rash, swelling in your mouth or lips, or difficulty breathing; call 911 and seek immediate emergency medical attention if this occurs.  You may review your lab tests and imaging results in their entirety on your MyChart account.  Please discuss all results of fully with your primary care provider and other  specialist at your follow-up visit.  Note: Portions of this text may have been transcribed using voice recognition software. Every effort was made to ensure accuracy; however, inadvertent computerized transcription errors may still be present.

## 2022-05-25 NOTE — ED Notes (Signed)
Admitting at beside now.

## 2022-05-25 NOTE — ED Triage Notes (Signed)
Pt BIBGEMS from [facility] for wound that began bleeding. Facility could not control bleeding, no active bleeding for EMS post abdominal dressing.   118/74 HR 74   Takes eliquis

## 2022-05-25 NOTE — ED Triage Notes (Signed)
Pt BIB EMS after being discharged today for wound check on right sided pressure wound. Pt with PTAR when left sided wound started actively bleeding. Patient brought back in by EMS for profuse active bleed on left sided wound. Pt alert and oriented. MD at bedside

## 2022-05-25 NOTE — ED Notes (Signed)
Admitting provider for this pt Courtney Drummer, MD has been made aware of pts condition & has responded that he will be at bedside soon.

## 2022-05-25 NOTE — ED Notes (Signed)
ED TO INPATIENT HANDOFF REPORT  ED Nurse Name and Phone #:  Herbie Baltimore [phone]  S Name/Age/Gender Courtney Gray 52 y.o. female Room/Bed: TRABC/TRABC  Code Status   Code Status: Full Code  Home/SNF/Other Nursing Home Patient oriented to: self, place, time, and situation Is this baseline? Yes   Triage Complete: Triage complete  Chief Complaint Open abdominal wall wound [S31.109A] Hemorrhagic shock ([place]) [R57.8]  Triage Note Pt BIB EMS after being discharged today for wound check on right sided pressure wound. Pt with PTAR when left sided wound started actively bleeding. Patient brought back in by EMS for profuse active bleed on left sided wound. Pt alert and oriented. MD at bedside   Allergies Allergies  Allergen Reactions   Benzonatate Other (See Comments)    Hallucinations   Hydralazine Other (See Comments)    Hallucinations   Amoxicillin Itching and Nausea And Vomiting   Clonidine Other (See Comments)    Altered mental state, insomnia    Chlorhexidine Itching    Patient reports it is the CHG baths that cause her itching not the CHG used for caring for her IV/HD access.     Morphine Itching   Oxycodone Itching    Pt tolerated hospital admission 07/2021    Level of Care/Admitting Diagnosis ED Disposition     ED Disposition  Admit   Condition  --   [place]: [place] [[id number]]  Level of Care: Progressive [102]  Admit to Progressive based on following criteria: MULTISYSTEM THREATS such as stable sepsis, metabolic/electrolyte imbalance with or without encephalopathy that is responding to early treatment.  May place patient in observation at [facility] or [facility] if equivalent level of care is available:: No  Covid Evaluation: Asymptomatic - no recent exposure (last 10 days) testing not required  Diagnosis: [id number]  Admitting Physician: Marcelyn Bruins [id number]  Attending Physician: Marcelyn Bruins [id number]          B Medical/Surgery History Past Medical History:  Diagnosis Date   Arthritis    Bacteremia    Diabetes mellitus without complication ([place])    Dialysis complication, subsequent encounter    ESRD (end stage renal disease) ([place])    Gout    High cholesterol    Hypertension    Hypertensive emergency    Ischemic foot pain at rest 08/03/2021   Renal disorder    Past Surgical History:  Procedure Laterality Date   ABDOMINAL AORTOGRAM W/LOWER EXTREMITY N/A 08/07/2021   Procedure: ABDOMINAL AORTOGRAM W/LOWER EXTREMITY;  Surgeon: Broadus John, MD;  Location: [place] CV LAB;  Service: Cardiovascular;  Laterality: N/A;   AMPUTATION Right 08/14/2021   Procedure: AMPUTATION ABOVE KNEE;  Surgeon: Marty Heck, MD;  Location: [place];  Service: Vascular;  Laterality: Right;   AMPUTATION Left 09/23/2021   Procedure: AMPUTATION MIDDLE FINGER;  Surgeon: Sherilyn Cooter, MD;  Location: [place];  Service: Orthopedics;  Laterality: Left;   AMPUTATION Left 10/09/2021   Procedure: LEFT ABOVE KNEE AMPUTATION;  Surgeon: Cherre Robins, MD;  Location: [facility] OR;  Service: Vascular;  Laterality: Left;   AV FISTULA PLACEMENT     x2, unsuccessful.    BUBBLE STUDY  09/27/2021   Procedure: BUBBLE STUDY;  Surgeon: Lelon Perla, MD;  Location: [facility] ENDOSCOPY;  Service: Cardiovascular;;   IR FLUORO GUIDE CV LINE LEFT  12/14/2020   IR FLUORO GUIDE CV LINE LEFT  02/20/2021   IR FLUORO GUIDE CV LINE  LEFT  09/11/2021   IR FLUORO GUIDE CV LINE LEFT  10/18/2021   IR FLUORO GUIDE CV LINE LEFT  11/26/2021   IR FLUORO GUIDE CV LINE LEFT  11/28/2021   IR FLUORO GUIDE CV LINE LEFT  02/04/2022   IR FLUORO GUIDE CV LINE LEFT  03/07/2022   IR FLUORO GUIDE CV LINE RIGHT  08/28/2020   IR FLUORO GUIDE CV LINE RIGHT  05/13/2021   IR FLUORO GUIDE CV LINE RIGHT  05/24/2021   IR INTRAVASCULAR ULTRASOUND NON CORONARY  03/07/2022   IR PERC TUN PERIT CATH WO PORT S&I /IMAG  09/06/2020   IR PTA VENOUS  EXCEPT DIALYSIS CIRCUIT  02/20/2021   IR PTA VENOUS EXCEPT DIALYSIS CIRCUIT  05/13/2021   IR PTA VENOUS EXCEPT DIALYSIS CIRCUIT  11/28/2021   IR PTA VENOUS EXCEPT DIALYSIS CIRCUIT  03/07/2022   IR REMOVAL TUN CV CATH W/O FL  05/24/2021   IR REMOVAL TUN CV CATH W/O FL  10/15/2021   IR US GUIDE VASC ACCESS LEFT  09/06/2020   IR US GUIDE VASC ACCESS LEFT  09/11/2021   IR US GUIDE VASC ACCESS LEFT  10/18/2021   IR US GUIDE VASC ACCESS RIGHT  05/24/2021   IR US GUIDE VASC ACCESS RIGHT  09/10/2021   IR VENO/JUGULAR RIGHT  09/10/2021   IR VENOCAVAGRAM SVC  02/20/2021   IR VENOCAVAGRAM SVC  11/26/2021   IR VENOCAVAGRAM SVC  03/07/2022   TEE WITHOUT CARDIOVERSION N/A 09/27/2021   Procedure: TRANSESOPHAGEAL ECHOCARDIOGRAM (TEE);  Surgeon: Lelon Perla, MD;  Location: [place];  Service: Cardiovascular;  Laterality: N/A;   UPPER EXTREMITY VENOGRAPHY Right 12/04/2021   Procedure: UPPER EXTREMITY VENOGRAPHY;  Surgeon: Broadus John, MD;  Location: [place] CV LAB;  Service: Cardiovascular;  Laterality: Right;     A IV Location/Drains/Wounds Patient Lines/Drains/Airways Status     Active Line/Drains/Airways     Name Placement date Placement time Site Days   Peripheral IV 05/25/22 18 G Anterior;Left;Upper Arm 05/25/22  1513  Arm  less than 1   Fistula / Graft Right Upper arm Arteriovenous fistula 11/28/19  --  Upper arm  909   Hemodialysis Catheter Left Internal jugular Double lumen Permanent (Tunneled) 03/07/22  1105  Internal jugular  79   Pressure Injury 11/12/21 Hip Right Unstageable - Full thickness tissue loss in which the base of the injury is covered by slough (yellow, tan, gray, green or brown) and/or eschar (tan, brown or black) in the wound bed. 90% covered with slough 11/12/21  0800  -- 194   Pressure Injury 11/24/21 Flank Left;Lower Stage 2 -  Partial thickness loss of dermis presenting as a shallow open injury with a red, pink wound bed without slough. 11/24/21  0430  -- 182   Wound /  Incision (Open or Dehisced) 03/03/22 Non-pressure wound Buttocks Right;Lateral woundbed yellow with area of brown discoloraton in the middle, surrounded by hard, dark discolored skin. 03/03/22  0800  Buttocks  83   Wound / Incision (Open or Dehisced) 05/01/22 Non-pressure wound Anterior;Left 05/01/22  0030  --  24            Intake/Output Last 24 hours No intake or output data in the 24 hours ending 05/25/22 1911  Labs/Imaging Results for orders placed or performed during the hospital encounter of 05/25/22 (from the past 48 hour(s))  CBC with Differential     Status: Abnormal   Collection Time: 05/25/22  2:36 PM  Result Value Ref Range  WBC 8.5 4.0 - 10.5 K/uL   RBC 3.21 (L) 3.87 - 5.11 MIL/uL   Hemoglobin 8.9 (L) 12.0 - 15.0 g/dL   HCT 29.6 (L) 36.0 - 46.0 %   MCV 92.2 80.0 - 100.0 fL   MCH 27.7 26.0 - 34.0 pg   MCHC 30.1 30.0 - 36.0 g/dL   RDW 17.5 (H) 11.5 - 15.5 %   Platelets 176 150 - 400 K/uL    Comment: REPEATED TO VERIFY   nRBC 0.0 0.0 - 0.2 %   Neutrophils Relative % 71 %   Neutro Abs 6.1 1.7 - 7.7 K/uL   Lymphocytes Relative 18 %   Lymphs Abs 1.5 0.7 - 4.0 K/uL   Monocytes Relative 9 %   Monocytes Absolute 0.8 0.1 - 1.0 K/uL   Eosinophils Relative 1 %   Eosinophils Absolute 0.1 0.0 - 0.5 K/uL   Basophils Relative 1 %   Basophils Absolute 0.0 0.0 - 0.1 K/uL   Immature Granulocytes 0 %   Abs Immature Granulocytes 0.03 0.00 - 0.07 K/uL    Comment: Performed at [facility], [address] [place], [place] [id number]  Basic metabolic panel     Status: Abnormal   Collection Time: 05/25/22  2:36 PM  Result Value Ref Range   Sodium 137 135 - 145 mmol/L   Potassium 5.4 (H) 3.5 - 5.1 mmol/L   Chloride 95 (L) 98 - 111 mmol/L   CO2 24 22 - 32 mmol/L   Glucose, Bld 106 (H) 70 - 99 mg/dL    Comment: Glucose reference range applies only to samples taken after fasting for at least 8 hours.   BUN 38 (H) 6 - 20 mg/dL   Creatinine, Ser 7.56 (H) 0.44 - 1.00 mg/dL    Calcium 9.5 8.9 - 10.3 mg/dL   GFR, Estimated 6 (L) >60 mL/min    Comment: (NOTE) Calculated using the CKD-EPI Creatinine Equation (2021)    Anion gap 18 (H) 5 - 15    Comment: Performed at [facility] [address], [place], [place] [address]  Protime-INR     Status: Abnormal   Collection Time: 05/25/22  2:36 PM  Result Value Ref Range   Prothrombin Time 15.9 (H) 11.4 - 15.2 seconds   INR 1.3 (H) 0.8 - 1.2    Comment: (NOTE) INR goal varies based on device and disease states. Performed at [facility], [place] [address], [place], [place] [address]   Type and screen [place]     Status: None (Preliminary result)   Collection Time: 05/25/22  3:15 PM  Result Value Ref Range   ABO/RH(D) O POS    Antibody Screen NEG    Sample Expiration 05/28/2022,2359    Unit Number [id number]    Blood Component Type RBC LR PHER1    Unit division 00    Status of Unit REL FROM [facility]    Transfusion Status OK TO TRANSFUSE    Crossmatch Result Compatible    Unit Number [id number]    Blood Component Type RED CELLS,LR    Unit division 00    Status of Unit ISSUED    Transfusion Status OK TO TRANSFUSE    Crossmatch Result Compatible    Unit Number [id number]    Blood Component Type RBC LR PHER2    Unit division 00    Status of Unit REL FROM [facility]    Transfusion Status OK TO TRANSFUSE    Crossmatch Result  Compatible Performed at [facility], [place] [address], [place], [place] [address]    Unit Number [id number]    Blood Component Type RBC LR PHER1    Unit division 00    Status of Unit REL FROM [facility]    Transfusion Status OK TO TRANSFUSE    Crossmatch Result Compatible    Unit Number [id number]    Blood Component Type RBC LR PHER1    Unit division 00    Status of Unit ISSUED    Unit tag comment VERBAL ORDERS PER DR [place]    Transfusion Status OK TO TRANSFUSE    Crossmatch Result COMPATIBLE    Unit Number [id number]    Blood  Component Type RED CELLS,LR    Unit division 00    Status of Unit ISSUED    Transfusion Status OK TO TRANSFUSE    Crossmatch Result Compatible    Unit Number [id number]    Blood Component Type RED CELLS,LR    Unit division 00    Status of Unit ISSUED    Transfusion Status OK TO TRANSFUSE    Crossmatch Result Compatible    Unit Number [id number]    Blood Component Type RBC LR PHER2    Unit division 00    Status of Unit ISSUED    Transfusion Status OK TO TRANSFUSE    Crossmatch Result Compatible    Unit Number [id number]    Blood Component Type RED CELLS,LR    Unit division 00    Status of Unit ISSUED    Transfusion Status OK TO TRANSFUSE    Crossmatch Result Compatible    Unit Number [id number]    Blood Component Type RED CELLS,LR    Unit division 00    Status of Unit REL FROM [facility]    Transfusion Status OK TO TRANSFUSE    Crossmatch Result Compatible    Unit Number [id number]    Blood Component Type RED CELLS,LR    Unit division 00    Status of Unit REL FROM [facility]    Transfusion Status OK TO TRANSFUSE    Crossmatch Result Compatible    Unit Number [id number]    Blood Component Type RED CELLS,LR    Unit division 00    Status of Unit REL FROM [facility]    Transfusion Status OK TO TRANSFUSE    Crossmatch Result Compatible    Unit Number [id number]    Blood Component Type RED CELLS,LR    Unit division 00    Status of Unit REL FROM [facility]    Transfusion Status OK TO TRANSFUSE    Crossmatch Result Compatible   Prepare fresh frozen plasma     Status: None   Collection Time: 05/25/22  4:30 PM  Result Value Ref Range   Unit Number [id number]    Blood Component Type THAWED PLASMA    Unit division 00    Status of Unit REL FROM [facility]    Transfusion Status      OK TO TRANSFUSE Performed at [facility], [address], [place], [place] [address]    Unit Number [id number]    Blood Component Type THAWED PLASMA    Unit division 00    Status of  Unit REL FROM [facility]    Transfusion Status OK TO TRANSFUSE    Unit Number [id number]    Blood Component Type THW PLS APHR    Unit division B0    Status of Unit REL FROM [facility]    Transfusion  Status OK TO TRANSFUSE    Unit Number [id number]    Blood Component Type THAWED PLASMA    Unit division 00    Status of Unit REL FROM [facility]    Transfusion Status OK TO TRANSFUSE    Unit Number [id number]    Blood Component Type THAWED PLASMA    Unit division 00    Status of Unit REL FROM [facility]    Transfusion Status OK TO TRANSFUSE    Unit Number [id number]    Blood Component Type THW PLS APHR    Unit division A0    Status of Unit REL FROM [facility]    Transfusion Status OK TO TRANSFUSE    Unit Number [id number]    Blood Component Type THW PLS APHR    Unit division A0    Status of Unit REL FROM [facility]    Transfusion Status OK TO TRANSFUSE    Unit Number [id number]    Blood Component Type THW PLS APHR    Unit division A0    Status of Unit REL FROM [facility]    Transfusion Status OK TO TRANSFUSE   I-stat chem 8, ED (not at [facility], [facility] or [facility])     Status: Abnormal   Collection Time: 05/25/22  5:07 PM  Result Value Ref Range   Sodium 136 135 - 145 mmol/L   Potassium 5.5 (H) 3.5 - 5.1 mmol/L   Chloride 99 98 - 111 mmol/L   BUN 38 (H) 6 - 20 mg/dL   Creatinine, Ser 8.70 (H) 0.44 - 1.00 mg/dL   Glucose, Bld 84 70 - 99 mg/dL    Comment: Glucose reference range applies only to samples taken after fasting for at least 8 hours.   Calcium, Ion 1.08 (L) 1.15 - 1.40 mmol/L   TCO2 27 22 - 32 mmol/L   Hemoglobin 9.9 (L) 12.0 - 15.0 g/dL   HCT 29.0 (L) 36.0 - 46.0 %  Provider-confirm verbal Blood Bank order - RBC; 1 Unit; Order taken: 05/25/2022; 4:44 PM; Emergency Release; NO units ahead ABDOMINAL WOUND OPENING, 1 UNIT REMOVED FROM ED FRIDGE     Status: None   Collection Time: 05/25/22  6:49 PM  Result Value Ref Range   Blood product order confirm      MD AUTHORIZATION REQUESTED Performed at  [facility], [address] [place], [place] [address]    No results found.  Pending Labs Unresulted Labs (From admission, onward)     Start     Ordered   05/26/22 0500  Comprehensive metabolic panel  Tomorrow morning,   R        05/25/22 1703   05/26/22 0500  CBC  Tomorrow morning,   R        05/25/22 1703   05/25/22 2200  CBC  Once,   R        05/25/22 1703   05/25/22 1710  Prepare platelet pheresis  Once,   R        05/25/22 1710   05/25/22 1654  HIV Antibody (routine testing w rflx)  (HIV Antibody (Routine testing w reflex) panel)  Once,   R        05/25/22 1703            Vitals/Pain Today's Vitals   05/25/22 1830 05/25/22 1842 05/25/22 1900 05/25/22 1910  BP: 122/61  (!) 113/45   Pulse: (!) 59     Resp: (!) 21  16   Temp:  98 F (  36.7 C)    TempSrc:  Oral    SpO2: 100%     PainSc:    0-No pain    Isolation Precautions No active isolations  Medications Medications  aspirin EC tablet 81 mg (has no administration in time range)  atorvastatin (LIPITOR) tablet 10 mg (has no administration in time range)  midodrine (PROAMATINE) tablet 10 mg (has no administration in time range)  levothyroxine (SYNTHROID) tablet 100 mcg (has no administration in time range)  pantoprazole (PROTONIX) EC tablet 40 mg (has no administration in time range)  simethicone (MYLICON) chewable tablet 80 mg (has no administration in time range)  loperamide (IMODIUM) capsule 4 mg (has no administration in time range)  polyvinyl alcohol (LIQUIFILM TEARS) 1.4 % ophthalmic solution 1 drop (has no administration in time range)  sodium chloride flush (NS) 0.9 % injection 3 mL (has no administration in time range)  acetaminophen (TYLENOL) tablet 650 mg (has no administration in time range)    Or  acetaminophen (TYLENOL) suppository 650 mg (has no administration in time range)  polyethylene glycol (MIRALAX / GLYCOLAX) packet 17 g (has no administration in time range)  insulin aspart  (novoLOG) injection 0-6 Units (has no administration in time range)  sodium zirconium cyclosilicate (LOKELMA) packet 5 g (has no administration in time range)  lidocaine-EPINEPHrine (XYLOCAINE W/EPI) 2 %-1:200000 (PF) injection (  Given by Other 05/25/22 1558)  sodium chloride 0.9 % bolus 1,000 mL (0 mLs Intravenous Stopped 05/25/22 1703)    Mobility non-ambulatory     Focused Assessments    R Recommendations: See Admitting Provider Note  Report given to:   Additional Notes:  Bilateral AKA

## 2022-05-25 NOTE — ED Notes (Addendum)
Dressing reapplied to wound on right flank no active bleeding at this time.

## 2022-05-26 DIAGNOSIS — S31134A Puncture wound of abdominal wall without foreign body, left lower quadrant without penetration into peritoneal cavity, initial encounter: Secondary | ICD-10-CM | POA: Diagnosis not present

## 2022-05-26 DIAGNOSIS — D649 Anemia, unspecified: Secondary | ICD-10-CM

## 2022-05-26 DIAGNOSIS — S31109S Unspecified open wound of abdominal wall, unspecified quadrant without penetration into peritoneal cavity, sequela: Secondary | ICD-10-CM | POA: Diagnosis not present

## 2022-05-26 DIAGNOSIS — I739 Peripheral vascular disease, unspecified: Secondary | ICD-10-CM | POA: Diagnosis not present

## 2022-05-26 LAB — BPAM FFP
Blood Product Expiration Date: 202402222359
Blood Product Expiration Date: 202402222359
Blood Product Expiration Date: 202402222359
Blood Product Expiration Date: 202402222359
Blood Product Expiration Date: 202402222359
Blood Product Expiration Date: 202402222359
Blood Product Expiration Date: 202402222359
Blood Product Expiration Date: 202402222359
ISSUE DATE / TIME: 202402181655
ISSUE DATE / TIME: 202402181655
ISSUE DATE / TIME: 202402181655
ISSUE DATE / TIME: 202402181655
ISSUE DATE / TIME: 202402181711
ISSUE DATE / TIME: 202402181711
ISSUE DATE / TIME: 202402181711
ISSUE DATE / TIME: 202402181921
Unit Type and Rh: 600
Unit Type and Rh: 600
Unit Type and Rh: 6200
Unit Type and Rh: 6200
Unit Type and Rh: 6200
Unit Type and Rh: 6200
Unit Type and Rh: 6200
Unit Type and Rh: 6200

## 2022-05-26 LAB — COMPREHENSIVE METABOLIC PANEL
ALT: 10 U/L (ref 0–44)
AST: 12 U/L — ABNORMAL LOW (ref 15–41)
Albumin: 3 g/dL — ABNORMAL LOW (ref 3.5–5.0)
Alkaline Phosphatase: 52 U/L (ref 38–126)
Anion gap: 17 — ABNORMAL HIGH (ref 5–15)
BUN: 50 mg/dL — ABNORMAL HIGH (ref 6–20)
CO2: 26 mmol/L (ref 22–32)
Calcium: 9.5 mg/dL (ref 8.9–10.3)
Chloride: 94 mmol/L — ABNORMAL LOW (ref 98–111)
Creatinine, Ser: 8.6 mg/dL — ABNORMAL HIGH (ref 0.44–1.00)
GFR, Estimated: 5 mL/min — ABNORMAL LOW (ref >60)
Glucose, Bld: 85 mg/dL (ref 70–99)
Potassium: 5.5 mmol/L — ABNORMAL HIGH (ref 3.5–5.1)
Sodium: 137 mmol/L (ref 135–145)
Total Bilirubin: 0.6 mg/dL (ref 0.3–1.2)
Total Protein: 6.6 g/dL (ref 6.5–8.1)

## 2022-05-26 LAB — CBC
HCT: 28.7 % — ABNORMAL LOW (ref 36.0–46.0)
HCT: 27.3 % — ABNORMAL LOW (ref 36.0–46.0)
Hemoglobin: 9.1 g/dL — ABNORMAL LOW (ref 12.0–15.0)
Hemoglobin: 8.8 g/dL — ABNORMAL LOW (ref 12.0–15.0)
MCH: 28.9 pg (ref 26.0–34.0)
MCH: 29.3 pg (ref 26.0–34.0)
MCHC: 31.7 g/dL (ref 30.0–36.0)
MCHC: 32.2 g/dL (ref 30.0–36.0)
MCV: 91.1 fL (ref 80.0–100.0)
MCV: 91 fL (ref 80.0–100.0)
Platelets: 150 10*3/uL (ref 150–400)
Platelets: 151 10*3/uL (ref 150–400)
RBC: 3.15 MIL/uL — ABNORMAL LOW (ref 3.87–5.11)
RBC: 3 MIL/uL — ABNORMAL LOW (ref 3.87–5.11)
RDW: 17.2 % — ABNORMAL HIGH (ref 11.5–15.5)
RDW: 17.2 % — ABNORMAL HIGH (ref 11.5–15.5)
WBC: 11.1 10*3/uL — ABNORMAL HIGH (ref 4.0–10.5)
WBC: 9.4 10*3/uL (ref 4.0–10.5)
nRBC: 0 % (ref 0.0–0.2)
nRBC: 0 % (ref 0.0–0.2)

## 2022-05-26 LAB — BLOOD PRODUCT ORDER (VERBAL) VERIFICATION

## 2022-05-26 LAB — PREPARE FRESH FROZEN PLASMA
Unit division: 0
Unit division: 0
Unit division: 0
Unit division: 0

## 2022-05-26 LAB — HIV ANTIBODY (ROUTINE TESTING W REFLEX): HIV Screen 4th Generation wRfx: NONREACTIVE

## 2022-05-26 LAB — HEPATITIS B SURFACE ANTIGEN: Hepatitis B Surface Ag: NONREACTIVE

## 2022-05-26 MED ORDER — HEPARIN SODIUM (PORCINE) 1000 UNIT/ML IJ SOLN
INTRAMUSCULAR | Status: AC
  Administered 2022-05-26: 4200 [IU]
  Filled 2022-05-26: qty 5

## 2022-05-26 MED ORDER — SODIUM CHLORIDE 0.9% FLUSH
10.0000 mL | Freq: Two times a day (BID) | INTRAVENOUS | Status: DC
  Administered 2022-05-26 – 2022-05-28 (×4): 10 mL

## 2022-05-26 MED ORDER — SODIUM CHLORIDE 0.9% FLUSH: 10.0000 mL | INTRAVENOUS | Status: DC | PRN

## 2022-05-26 MED ORDER — SODIUM THIOSULFATE 250 MG/ML IV SOLN
12.5000 g | INTRAVENOUS | Status: DC
  Administered 2022-05-26 – 2022-05-28 (×2): 12.5 g via INTRAVENOUS
  Filled 2022-05-26 (×3): qty 50

## 2022-05-26 MED ORDER — OXYCODONE HCL 5 MG PO TABS
10.0000 mg | ORAL_TABLET | Freq: Four times a day (QID) | ORAL | Status: DC | PRN
  Administered 2022-05-26: 10 mg via ORAL
  Filled 2022-05-26: qty 2

## 2022-05-26 MED ORDER — OXYCODONE HCL 5 MG PO TABS
10.0000 mg | ORAL_TABLET | ORAL | Status: DC | PRN
  Administered 2022-05-26: 10 mg via ORAL
  Administered 2022-05-26: 5 mg via ORAL
  Filled 2022-05-26 (×2): qty 2

## 2022-05-26 MED ORDER — HEPARIN SODIUM (PORCINE) 1000 UNIT/ML IJ SOLN
4200.0000 [IU] | Freq: Once | INTRAMUSCULAR | Status: AC
  Filled 2022-05-26: qty 4.2

## 2022-05-26 NOTE — Progress Notes (Signed)
   05/26/22 0930  Vitals  BP (!) 86/63  MAP (mmHg) 71  BP Location Left Wrist  BP Method Automatic  Patient Position (if appropriate) Lying  Pulse Rate (!) 50  Pulse Rate Source Monitor  ECG Heart Rate (!) 52  Resp 15  Oxygen Therapy  SpO2 100 %  O2 Device Nasal Cannula  O2 Flow Rate (L/min) 2 L/min  Pre Treatment  Dialysis Blood Pressure Support Ordered Other (Comment) (midodrine given 23m)  During Treatment Monitoring  Blood Flow Rate (mL/min) 400 mL/min  Arterial Pressure (mmHg) -200 mmHg  Venous Pressure (mmHg) 230 mmHg  TMP (mmHg) 4 mmHg  Ultrafiltration Rate (mL/min) 0 mL/min  Dialysate Flow Rate (mL/min) 300 ml/min  HD Safety Checks Performed Yes  Intra-Hemodialysis Comments See progress note (PT UF off d/t hypotension)

## 2022-05-26 NOTE — Progress Notes (Addendum)
[facility] Progress Note   Subjective:    Seen and examined patient in HD. So far, tolerating UFG 2.5L. BP is stable. She reports ongoing pain.  Objective Vitals:   05/26/22 0630 05/26/22 0800 05/26/22 0815 05/26/22 0830  BP:  (!) 123/53 (!) 116/45 (!) 116/45  Pulse:  (!) 59 (!) 57   Resp: 14 11 14 13  $ Temp:  98.1 F (36.7 C)    TempSrc:      SpO2:  100%    Weight:  118.8 kg     Physical Exam General: Awake, alert, on RA, NAD Heart: S1 and S2; No MRGs Lungs: Clear throughout; No wheezing, rales, or rhonchi Abdomen: Large, soft, noted ABD dressing-no active signs for bleeding Extremities: Bilateral AKAs, no edema BL stumps Dialysis Access: L IJ [facility]   Filed Weights   05/26/22 0609 05/26/22 0800  Weight: 119.6 kg 118.8 kg    Intake/Output Summary (Last 24 hours) at 05/26/2022 0851 Last data filed at 05/26/2022 0140 Gross per 24 hour  Intake 240 ml  Output --  Net 240 ml    Additional Objective Labs: Basic Metabolic Panel: Recent Labs  Lab 05/25/22 1436 05/25/22 1707 05/26/22 0440  NA 137 136 137  K 5.4* 5.5* 5.5*  CL 95* 99 94*  CO2 24  --  26  GLUCOSE 106* 84 85  BUN 38* 38* 50*  CREATININE 7.56* 8.70* 8.60*  CALCIUM 9.5  --  9.5   Liver Function Tests: Recent Labs  Lab 05/26/22 0440  AST 12*  ALT 10  ALKPHOS 52  BILITOT 0.6  PROT 6.6  ALBUMIN 3.0*   No results for input(s): "LIPASE", "AMYLASE" in the last 168 hours. CBC: Recent Labs  Lab 05/25/22 1436 05/25/22 1707 05/25/22 2310 05/26/22 0440  WBC 8.5  --  11.1* 9.4  NEUTROABS 6.1  --   --   --   HGB 8.9* 9.9* 9.1* 8.8*  HCT 29.6* 29.0* 28.7* 27.3*  MCV 92.2  --  91.1 91.0  PLT 176  --  150 151   Blood Culture    Component Value Date/Time   SDES BLOOD LEFT ANTECUBITAL 10/16/2021 1534   SPECREQUEST  10/16/2021 1534    BOTTLES DRAWN AEROBIC AND ANAEROBIC Blood Culture results may not be optimal due to an inadequate volume of blood received in culture bottles   CULT   10/16/2021 1534    NO GROWTH 5 DAYS Performed at [facility] [address] [place], [place] [address]    REPTSTATUS 10/21/2021 FINAL 10/16/2021 1534    Cardiac Enzymes: No results for input(s): "CKTOTAL", "CKMB", "CKMBINDEX", "TROPONINI" in the last 168 hours. CBG: No results for input(s): "GLUCAP" in the last 168 hours. Iron Studies: No results for input(s): "IRON", "TIBC", "TRANSFERRIN", "FERRITIN" in the last 72 hours. Lab Results  Component Value Date   INR 1.3 (H) 05/25/2022   INR 1.3 (H) 10/09/2021   INR 1.3 (H) 09/10/2021   Studies/Results: No results found.  Medications:   aspirin EC  81 mg Oral Daily   atorvastatin  10 mg Oral QHS   insulin aspart  0-6 Units Subcutaneous TID WC   levothyroxine  100 mcg Oral Daily   midodrine  10 mg Oral Q M,W,F-2000   pantoprazole  40 mg Oral Daily   sodium chloride flush  10-40 mL Intracatheter Q12H   sodium chloride flush  3 mL Intravenous Q12H   sodium zirconium cyclosilicate  10 g Oral Once    Dialysis  Orders: MWF SW 4h  400/1.5   116kg  2/2 bath  Hep 5000  [facility] TDC - last HD 2/16, post wt 117.2kg   - sodium thiosulfate 12.5 gm IV tiw - venofer 123m each HD 2/21 thru 3/13 - mircera 225 mcg IV q 2wks, last on 2/14, due on 2/28  Home meds include - aspirin, lipitor, colace, pepcid, lactulose, synthroid, midodrine 10 mg pre hd mwf, 4L [place] chronic home O2, protonix, fentanyl patch 25 ug q 3d, gabapentin 100 hs, oxy IR prn, lyrica, renvela 4.8gm ac tid, velphoro 1 gm ac tid, prns/ vits/ supps   Assessment/Plan: Acute bleeding w/ shock - from LLQ skin wounds, pt is on eliquis, hx calciphylaxis causing bilat abd wounds. Seen by gen surgery and the bleeding wound was sutured. Pt also sp prbc's x 2.   Hypotension - Bps currently stable on HD. Monitor trend. ESRD - on HD MWF. On HD. Hyperkalemia - Lokelma given yesterday for K+ 5.5. On HD. HTN/ volume - no gross vol overload on exam Anemia esrd - Hgb from 9.9 at admit to  8.8 here. Just got esa on 2/14, next due on 2/28. S/p 2 units PRBCs. 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 Cont binders.  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

## 2022-05-26 NOTE — Progress Notes (Signed)
Patient refused lokelma. Patient wants to wait until dailysis

## 2022-05-26 NOTE — Progress Notes (Signed)
Patient stated that she does not check her CBGs anymore and that it had been stopped. CBG has not been done at 2200 b/c of the above statement

## 2022-05-26 NOTE — Plan of Care (Signed)
  Problem: Health Behavior/Discharge Planning: Goal: Ability to manage health-related needs will improve Outcome: Not Progressing   

## 2022-05-26 NOTE — Progress Notes (Addendum)
[facility]  PROGRESS NOTE  Courtney Gray [id number] DOB: [birth year] DOA: 05/25/2022 PCP: Monico Blitz, MD   Brief HPI:   52 year old female with medical history of diabetes mellitus type 2, IBS, hypertension, GERD, ESRD on HD, chronic respiratory failure, anemia, chronic diastolic CHF, s/p bilateral AKA, PAD s/p splenic infarct, obesity, CAD, hyperlipidemia presented with wound issues.  Patient has history of calciphylaxis and ulcer in the left anterior abdominal wall wound.  She was seen in the ED yesterday morning for right flank wound, she was discharged home and returned to ED with new bleeding from left lower quadrant abdominal wound.  Manual pressure was held and suture ligation attempted but significant bleeding persisted.  General surgery was consulted. Absorbable suture was placed for venous bleeding and hemostasis obtained.   Subjective   Patient seen and examined, complains of pain at the site of ulcer.   Assessment/Plan:    Abdominal wound/hemorrhagic shock -Presented abdominal wound bleeding for past 2 days. -Presented with significant bleeding, general surgery was consulted, suture ligation was done to obtain hemostasis in the ED -Patient has known history of calciphylaxis -Patient became significantly hypotensive with SBP in 50s, emergent 2 units of PRBC were given -PCCM was consulted however they felt patient was stable to go to the floor -Patient takes Eliquis which is currently on hold -Will consult wound care for abdominal wounds  Acute blood loss anemia -Hemoglobin dropped to 8.8 this morning -Follow CBC in a.m. and transfuse for Hb less than 7  Peripheral arterial disease -S/p bilateral AKA's -Continue aspirin, atorvastatin -Eliquis on hold  Diabetes mellitus type 2 -Patient was started on sliding scale insulin with NovoLog -CBG well-controlled -Patient does not want CBG monitoring, last A1c was 5.3 in September 2023 -Will discontinue CBG  monitoring and sliding scale insulin  Hypotension -Continue midodrine 10 mg, q. M,W,F  ESRD on HD -Nephrology consulted  Chronic respiratory failure with hypoxemia -Has been on oxygen 2 to 4 L/min     Medications     aspirin EC  81 mg Oral Daily   atorvastatin  10 mg Oral QHS   insulin aspart  0-6 Units Subcutaneous TID WC   levothyroxine  100 mcg Oral Daily   midodrine  10 mg Oral Q M,W,F-2000   pantoprazole  40 mg Oral Daily   sodium chloride flush  10-40 mL Intracatheter Q12H   sodium chloride flush  3 mL Intravenous Q12H   sodium zirconium cyclosilicate  10 g Oral Once     Data Reviewed:   CBG:  No results for input(s): "GLUCAP" in the last 168 hours.  SpO2: 100 % O2 Flow Rate (L/min): 2 L/min    Vitals:   05/26/22 1157 05/26/22 1204 05/26/22 1344 05/26/22 1635  BP: 112/73 131/86 (!) 141/47 (!) 131/57  Pulse: (!) 59 60 (!) 51 (!) 53  Resp: 12 14 13 13  $ Temp:  97.6 F (36.4 C)  98.4 F (36.9 C)  TempSrc:  Oral  Oral  SpO2: 100% 100%  100%  Weight:  112.3 kg        Data Reviewed:  Basic Metabolic Panel: Recent Labs  Lab 05/25/22 1436 05/25/22 1707 05/26/22 0440  NA 137 136 137  K 5.4* 5.5* 5.5*  CL 95* 99 94*  CO2 24  --  26  GLUCOSE 106* 84 85  BUN 38* 38* 50*  CREATININE 7.56* 8.70* 8.60*  CALCIUM 9.5  --  9.5    CBC: Recent Labs  Lab 05/25/22 1436 05/25/22 1707  05/25/22 2310 05/26/22 0440  WBC 8.5  --  11.1* 9.4  NEUTROABS 6.1  --   --   --   HGB 8.9* 9.9* 9.1* 8.8*  HCT 29.6* 29.0* 28.7* 27.3*  MCV 92.2  --  91.1 91.0  PLT 176  --  150 151    LFT Recent Labs  Lab 05/26/22 0440  AST 12*  ALT 10  ALKPHOS 52  BILITOT 0.6  PROT 6.6  ALBUMIN 3.0*     Antibiotics: Anti-infectives (From admission, onward)    None        DVT prophylaxis: none, no heparin as patient came with bleeding from abdominal wounds.  No SCDs as patient has bilateral AKA's.  Code Status: Full code  Family Communication: No family at  bedside   CONSULTS nephrology   Objective    Physical Examination:   General-appears in no acute distress Heart-S1-S2, regular, no murmur auscultated Lungs-clear to auscultation bilaterally, no wheezing or crackles auscultated Abdomen-soft, nontender, no organomegaly, dressings in place  Extremities-bilateral AKA's Neuro-alert, oriented x3, no focal deficit noted  Status is: Inpatient:          Oswald Hillock   [facility] If 7PM-7AM, please contact night-coverage at [email or web address], Office  [phone]   05/26/2022, 5:20 PM  LOS: 0 days

## 2022-05-26 NOTE — TOC Initial Note (Signed)
Transition of Care [facility]) - Initial/Assessment Note    Patient Details  Name: Courtney Gray MRN: [id number] Date of Birth: 05/29/1970  Transition of Care [facility]) CM/SW Contact:    Pollie Friar, RN Phone Number: 05/26/2022, 4:14 PM  Clinical Narrative:                 Pt is from [facility]. Plan is for her to return when medically ready.  TOC following.  Expected Discharge Plan: Skilled Nursing Facility Barriers to Discharge: Continued Medical Work up   Patient Goals and CMS Choice   CMS Medicare.gov Compare Post Acute Care list provided to:: Patient Choice offered to / list presented to : Patient      Expected Discharge Plan and Services In-house Referral: Clinical Social Work   Post Acute Care Choice: [place] Living arrangements for the past 2 months: [place]                                      Prior Living Arrangements/Services Living arrangements for the past 2 months: [place] Lives with:: Facility Resident   Do you feel safe going back to the place where you live?: Yes               Activities of Daily Living      Permission Sought/Granted                  Emotional Assessment       Orientation: : Oriented to Self, Oriented to Place, Oriented to  Time, Oriented to Situation   Psych Involvement: No (comment)  Admission diagnosis:  Hemorrhagic shock ([place]) [R57.8] Visit for wound check [Z51.89] Open abdominal wall wound [S31.109A] Anemia, unspecified type [D64.9] Patient Active Problem List   Diagnosis Date Noted   Open abdominal wall wound 05/25/2022   Hemorrhagic shock ([place]) 05/25/2022   Nodule of skin of abdomen 02/16/2022   Chronic diastolic CHF (congestive heart failure) ([place]) 12/12/2021   PAD (peripheral artery disease) ([place]) 12/12/2021   Irritable bowel syndrome (IBS) 12/12/2021   Amputation of left middle finger 12/12/2021   GERD (gastroesophageal reflux disease)  12/12/2021   Hypertension    Type 2 diabetes mellitus with hyperlipidemia ([place])    Decubitus ulcer of sacral region, stage 3 ([place])    PVD (peripheral vascular disease) ([place])    Hepatic abscess    Pressure injury of skin 09/13/2021   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[place])    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   Mixed diabetic hyperlipidemia associated with type 2 diabetes mellitus ([place]) 09/10/2021   Coronary artery disease involving native coronary artery of native heart without angina pectoris 09/10/2021   Dry gangrene ([place])    Pain 08/03/2021   Volume overload 07/09/2021   Acute gout 06/11/2021   Hemorrhoids    Rectal bleeding 05/17/2021   Morbid obesity with BMI of 60.0-69.9, adult ([place]) 05/17/2021   NSTEMI (non-ST elevated myocardial infarction) ([place]) 05/16/2021   End stage renal disease ([place]) 09/01/2020   Chronic respiratory failure with hypoxia ([place]) 09/01/2020   Hypertensive urgency 11/28/2019   Anemia due to chronic kidney disease 11/28/2019   PCP:  Monico Blitz, MD Pharmacy:   [place] [address] [place] [address] Phone: [phone] Fax: [phone]  [facility] [address]  [place] [address] Phone: [phone] Fax: [phone]  [place], [place] - [address] [place] [address] Phone: [phone] Fax: [phone]     Social Determinants of Health (SDOH) Social History: [place]: No Food Insecurity (12/11/2021)  Housing: Low Risk  (12/11/2021)  Transportation Needs: No Transportation Needs (12/11/2021)  Utilities: At Risk (12/11/2021)  Tobacco Use: Low Risk  (05/25/2022)   SDOH Interventions:     Readmission Risk Interventions    09/16/2021    4:36 PM 08/28/2021   11:50 AM 07/12/2021   12:38 PM  Readmission Risk Prevention Plan  Transportation Screening Complete Complete Complete   Medication Review Press photographer)  Complete Complete  PCP or Specialist appointment within 3-5 days of discharge  Complete Complete  HRI or Home Care Consult Complete Complete Complete  SW Recovery Care/Counseling Consult Complete Complete Complete  Palliative Care Screening  Not Applicable Not Applicable  Skilled Nursing Facility Complete Not Applicable Complete

## 2022-05-26 NOTE — Consult Note (Signed)
  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  R ischium 4 cms x 2 cms x 5 cms 100% clean and pink wound bed, epibole of wound edges as previously noted  2.  R abdomen 5 cms x 1 cms x 0.1 50% yellow fibrous tissue 50% pink moist  3.  Left lower quadrant 4 cms x 3 cms x 0.8 cms 50% yellow necrotic tissue 50% red moist, no tunnel noted at this time, patient states this area was "sutured" at some point    Drainage (amount, consistency, odor) LLQ with moderate serosanguinous, R abdomen scant serosanguinous and R ischium scant serosanguinous  Periwound: intact  Dressing procedure/placement/frequency:  R ischium clean with NS, dry, cut piece of Silver Hydrofiber Kellie Simmering [phone]) daily as patient will allow and cover with foam dressing.  R abdomen clean with NS, cut piece of Xeroform gauze Kellie Simmering [phone]) and apply to wound bed daily as patient will allow and cover with foam dressing.  Left lower quadrant clean with NS, cut piece of Silver Hydrdofiber Kellie Simmering [phone]) daily as patient will allow and cover with foam dressing.  Change all foam dressings q3 days and prn soiling.  These wounds are chronic and patient has not tolerated Medihoney to any necrotic tissue in the past.  Long conversation with patient regarding what wound care she would allow. Patient is agreeable to Silver Hydrofiber and Xeroform.   Patient states wound to LLQ and R abdomen are very painful and she often can not tolerate daily dressing changes.  Patient states she "can tell"when her dressings need to be changed.  I told her Silver and Xeroform do  not necessarily have to be changed daily but  that in the hospital we write for daily dressing changes so the wounds can be assessed daily.  Patient states she would like for WOC to order wound care dressings daily prn.    POC discussed with patient and bedside nurse.  WOC will not follow at this time.  Re-consult if further needs arise.    Thank you,    Liliana Dang MSN, RN-BC, [employer]

## 2022-05-26 NOTE — Care Management Obs Status (Signed)
[facility] NOTIFICATION   Patient Details  Name: Courtney Gray MRN: [id number] Date of Birth: 01-08-1971   Medicare Observation Status Notification Given:  Yes    Pollie Friar, RN 05/26/2022, 3:57 PM

## 2022-05-26 NOTE — Progress Notes (Signed)
Received patient in bed to unit.  Alert and oriented.  Informed consent signed and in chart.   [place] duration:3.5  Patient tolerated well.  Transported back to the room  Alert, without acute distress.  Hand-off given to patient's nurse.   Access used: LEFT TDC Access issues: NONE  Total UF removed: 2.5L Medication(s) given: MIDODRINE 10MG   05/26/22 1157  Vitals  BP 112/73  MAP (mmHg) 84  BP Location Left Wrist  BP Method Automatic  Patient Position (if appropriate) Lying  Pulse Rate (!) 59  Pulse Rate Source Monitor  ECG Heart Rate (!) 59  Resp 12  Oxygen Therapy  SpO2 100 %  O2 Device Nasal Cannula  O2 Flow Rate (L/min) 2 L/min  During Treatment Monitoring  Intra-Hemodialysis Comments Tx completed  Dialysis Fluid Bolus Normal Saline      Seona Clemenson S [facility]

## 2022-05-27 DIAGNOSIS — E1169 Type 2 diabetes mellitus with other specified complication: Secondary | ICD-10-CM | POA: Diagnosis not present

## 2022-05-27 DIAGNOSIS — Z89611 Acquired absence of right leg above knee: Secondary | ICD-10-CM | POA: Diagnosis not present

## 2022-05-27 DIAGNOSIS — S31134A Puncture wound of abdominal wall without foreign body, left lower quadrant without penetration into peritoneal cavity, initial encounter: Secondary | ICD-10-CM | POA: Diagnosis not present

## 2022-05-27 DIAGNOSIS — Z6841 Body Mass Index (BMI) 40.0 and over, adult: Secondary | ICD-10-CM

## 2022-05-27 DIAGNOSIS — T148XXA Other injury of unspecified body region, initial encounter: Secondary | ICD-10-CM

## 2022-05-27 DIAGNOSIS — N186 End stage renal disease: Secondary | ICD-10-CM | POA: Diagnosis not present

## 2022-05-27 LAB — RENAL FUNCTION PANEL
Albumin: 3 g/dL — ABNORMAL LOW (ref 3.5–5.0)
Anion gap: 18 — ABNORMAL HIGH (ref 5–15)
BUN: 30 mg/dL — ABNORMAL HIGH (ref 6–20)
CO2: 25 mmol/L (ref 22–32)
Calcium: 9.6 mg/dL (ref 8.9–10.3)
Chloride: 96 mmol/L — ABNORMAL LOW (ref 98–111)
Creatinine, Ser: 5.97 mg/dL — ABNORMAL HIGH (ref 0.44–1.00)
GFR, Estimated: 8 mL/min — ABNORMAL LOW (ref >60)
Glucose, Bld: 77 mg/dL (ref 70–99)
Phosphorus: 6.3 mg/dL — ABNORMAL HIGH (ref 2.5–4.6)
Potassium: 4.8 mmol/L (ref 3.5–5.1)
Sodium: 139 mmol/L (ref 135–145)

## 2022-05-27 LAB — CBC WITH DIFFERENTIAL/PLATELET
Abs Immature Granulocytes: 0.03 10*3/uL (ref 0.00–0.07)
Basophils Absolute: 0 10*3/uL (ref 0.0–0.1)
Basophils Relative: 0 %
Eosinophils Absolute: 0.3 10*3/uL (ref 0.0–0.5)
Eosinophils Relative: 3 %
HCT: 28.7 % — ABNORMAL LOW (ref 36.0–46.0)
Hemoglobin: 8.7 g/dL — ABNORMAL LOW (ref 12.0–15.0)
Immature Granulocytes: 0 %
Lymphocytes Relative: 20 %
Lymphs Abs: 1.9 10*3/uL (ref 0.7–4.0)
MCH: 28.4 pg (ref 26.0–34.0)
MCHC: 30.3 g/dL (ref 30.0–36.0)
MCV: 93.8 fL (ref 80.0–100.0)
Monocytes Absolute: 1.1 10*3/uL — ABNORMAL HIGH (ref 0.1–1.0)
Monocytes Relative: 11 %
Neutro Abs: 6.5 10*3/uL (ref 1.7–7.7)
Neutrophils Relative %: 66 %
Platelets: 152 10*3/uL (ref 150–400)
RBC: 3.06 MIL/uL — ABNORMAL LOW (ref 3.87–5.11)
RDW: 17.6 % — ABNORMAL HIGH (ref 11.5–15.5)
WBC: 9.9 10*3/uL (ref 4.0–10.5)
nRBC: 0 % (ref 0.0–0.2)

## 2022-05-27 LAB — HEPATITIS B SURFACE ANTIBODY, QUANTITATIVE: Hep B S AB Quant (Post): <3.1 m[IU]/mL — ABNORMAL LOW (ref >9.9)

## 2022-05-27 MED ORDER — PREGABALIN 50 MG PO CAPS: 50.0000 mg | ORAL_CAPSULE | Freq: Every day | ORAL | 0 refills | Status: DC

## 2022-05-27 MED ORDER — FENTANYL 25 MCG/HR TD PT72: 1.0000 | MEDICATED_PATCH | TRANSDERMAL | 0 refills | Status: AC

## 2022-05-27 MED ORDER — ONDANSETRON HCL 4 MG/2ML IJ SOLN
4.0000 mg | Freq: Once | INTRAMUSCULAR | Status: AC
  Administered 2022-05-27: 4 mg via INTRAVENOUS
  Filled 2022-05-27: qty 2

## 2022-05-27 MED ORDER — OXYCODONE HCL 5 MG PO TABS: 5.0000 mg | ORAL_TABLET | ORAL | 0 refills | Status: AC | PRN

## 2022-05-27 MED ORDER — POLYETHYLENE GLYCOL 3350 17 G PO PACK: 17.0000 g | PACK | Freq: Every day | ORAL | Status: DC

## 2022-05-27 MED ORDER — HEPARIN SODIUM (PORCINE) 1000 UNIT/ML DIALYSIS: 1000.0000 [IU] | INTRAMUSCULAR | Status: DC | PRN

## 2022-05-27 MED ORDER — LIDOCAINE HCL (PF) 1 % IJ SOLN: 5.0000 mL | INTRAMUSCULAR | Status: DC | PRN

## 2022-05-27 MED ORDER — LIDOCAINE-PRILOCAINE 2.5-2.5 % EX CREA: 1.0000 | TOPICAL_CREAM | CUTANEOUS | Status: DC | PRN

## 2022-05-27 MED ORDER — OXYCODONE HCL 5 MG PO TABS
5.0000 mg | ORAL_TABLET | ORAL | Status: DC | PRN
  Administered 2022-05-27 – 2022-05-28 (×4): 5 mg via ORAL
  Filled 2022-05-27 (×4): qty 1

## 2022-05-27 MED ORDER — LACTULOSE 10 GM/15ML PO SOLN
30.0000 g | Freq: Once | ORAL | Status: DC
  Filled 2022-05-27: qty 45

## 2022-05-27 MED ORDER — PENTAFLUOROPROP-TETRAFLUOROETH EX AERO: 1.0000 | INHALATION_SPRAY | CUTANEOUS | Status: DC | PRN

## 2022-05-27 MED ORDER — ALTEPLASE 2 MG IJ SOLR: 2.0000 mg | Freq: Once | INTRAMUSCULAR | Status: DC | PRN

## 2022-05-27 MED ORDER — BISACODYL 10 MG RE SUPP
10.0000 mg | Freq: Once | RECTAL | Status: AC
  Administered 2022-05-27: 10 mg via RECTAL
  Filled 2022-05-27: qty 1

## 2022-05-27 NOTE — Discharge Summary (Signed)
Physician Discharge Summary  Courtney Gray [id number] DOB: 1971-01-10 DOA: 05/25/2022  PCP: Monico Blitz, MD  Admit date: 05/25/2022 Discharge date: 05/28/2022  Admitted From: Skilled nursing facility Disposition: Skilled nursing facility  Recommendations for Outpatient Follow-up:  Follow up with PCP in 1-2 weeks after discharge. Continue wound care.  Continue hemodialysis as per schedule.   Discharge Condition: Fair CODE STATUS: Full code Diet recommendation: Low-salt and low-carb diet, nutritional supplements.  Discharge summary: 52 year old female with medical history of diabetes mellitus type 2, IBS, hypertension, GERD, ESRD on HD, chronic respiratory failure, anemia, chronic diastolic CHF, s/p bilateral AKA, PAD s/p splenic infarct, obesity, CAD, hyperlipidemia presented with wound issues.  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 She was seen in the ED the day before for right flank wound, she was discharged to nursing home , returned to ED with new bleeding from left lower quadrant abdominal wound.  Manual pressure was held and suture ligation attempted but significant bleeding persisted.  General surgery was consulted. Absorbable suture was placed for venous bleeding and hemostasis obtained. Patient was monitored in the hospital due to significant bleeding.  She also given 2 units of PRBC.  Currently improved.  Bleeding from chronic abdominal wound with hemorrhagic shock: Anemia due to acute blood loss. General surgery did suture ligation of the venous bleeding, pressure bandage placed and hemostasis achieved. Patient does have known ulcer with calciphylaxis, currently bleeding is stopped.  Continue local dressing as instructed. Blood pressure dropped to 50, emergent 2 unit PRBC transfused and has been stable since then. Previously on Eliquis, discontinued.  Can continue taking aspirin.  Peripheral artery disease: Status post bilateral  AKA.  On aspirin, atorvastatin.  Discontinue Eliquis for now due to significant bleeding and hemorrhagic shock.  ESRD on hemodialysis: Getting dialysis on her schedule.  She is also on midodrine with hemodialysis.  Chronic respiratory failure with hypoxemia: On 2 to 4 L of oxygen as needed.  Calciphylaxis: Open wound right flank that is clean and dry.  Open wound left lower abdominal wall that is mostly chronic looking up some scabbing, occlusive dressing.  Chronic pain symptoms: Phantom leg pain: On gabapentin at night On Lyrica 50 mg daily On fentanyl patch, prescribed On oxycodone, prescribed   Medically stable for discharge today.  Addendum 2/21.  10 AM. Patient seen receiving hemodialysis.  Remains stable.  Questions were answered.  Medication reconciliation was reviewed, discharge summary reviewed.  No changes needed.   Discharge Diagnoses:  Principal Problem:   Open abdominal wall wound Active Problems:   Morbid obesity with BMI of 60.0-69.9, adult (HCC)   End stage renal disease ([place])   Hypertension   Type 2 diabetes mellitus with hyperlipidemia (HCC)   Chronic respiratory failure with hypoxia (HCC)   Mixed diabetic hyperlipidemia associated with type 2 diabetes mellitus (HCC)   Anemia due to chronic kidney disease   Chronic diastolic CHF (congestive heart failure) (HCC)   Coronary artery disease involving native coronary artery of native heart without angina pectoris   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HCC)   PAD (peripheral artery disease) (HCC)   GERD (gastroesophageal reflux disease)   Hemorrhagic shock ([place])    Discharge Instructions  Discharge Instructions     Diet - low sodium heart healthy   Complete by: As directed    Diet Carb Modified   Complete by: As directed    Discharge wound care:   Complete by: As directed    1.  R ischium clean with NS, dry, cut piece of Silver Hydrofiber Kellie Simmering [phone]) daily as patient will allow and cover  with foam dressing.  2.         R abdomen clean with NS, cut piece of Xeroform gauze Kellie Simmering [phone]) and apply to wound bed daily as patient will allow and cover with foam dressing.  3.         Left lower quadrant clean with NS, cut piece of Silver Hydrdofiber Kellie Simmering [phone]) daily as patient will allow and cover with foam dressing.  Change all foam dressings q3 days and prn soilin   Increase activity slowly   Complete by: As directed       Allergies as of 05/28/2022       Reactions   Benzonatate Other (See Comments)   Hallucinations   Hydralazine Other (See Comments)   Hallucinations   Amoxicillin Itching, Nausea And Vomiting   Clonidine Other (See Comments)   Altered mental state, insomnia    Chlorhexidine Itching   Patient reports it is the CHG baths that cause her itching not the CHG used for caring for her IV/HD access.     Morphine Itching   Oxycodone Itching   Pt tolerated hospital admission 07/2021        Medication List     STOP taking these medications    Gerhardt's butt cream Crea   sucroferric oxyhydroxide 500 MG chewable tablet Commonly known as: VELPHORO       TAKE these medications    acetaminophen 500 MG tablet Commonly known as: TYLENOL Take 1,000 mg by mouth as needed for moderate pain.   aspirin EC 81 MG tablet Take 1 tablet (81 mg total) by mouth daily. Swallow whole.   atorvastatin 10 MG tablet Commonly known as: LIPITOR Take 1 tablet (10 mg total) by mouth daily. What changed: when to take this   Darbepoetin Alfa 200 MCG/0.4ML Sosy injection Commonly known as: ARANESP Inject 0.4 mLs (200 mcg total) into the skin every Tuesday at 6 PM.   docusate sodium 100 MG capsule Commonly known as: COLACE Take 100 mg by mouth 2 (two) times daily.   famotidine 20 MG tablet Commonly known as: PEPCID Take 1 tablet (20 mg total) by mouth daily.   fentaNYL 25 MCG/HR Commonly known as: [place] 1 patch onto the skin every 3 (three) days for 6  days.   gabapentin 100 MG capsule Commonly known as: NEURONTIN Take 1 capsule (100 mg total) by mouth at bedtime.   lactulose 10 GM/15ML solution Commonly known as: CHRONULAC Take 45 mLs (30 g total) by mouth 2 (two) times daily as needed for mild constipation.   levothyroxine 100 MCG tablet Commonly known as: SYNTHROID Take 100 mcg by mouth daily.   loperamide 2 MG tablet Commonly known as: IMODIUM A-D Take 4 mg by mouth as needed for diarrhea or loose stools.   loratadine 10 MG tablet Commonly known as: CLARITIN Take 1 tablet (10 mg total) by mouth daily as needed for allergies.   midodrine 10 MG tablet Commonly known as: PROAMATINE Take 1 tablet (10 mg total) by mouth every Monday, Wednesday, and Friday at 8 PM. Before hemodialysis   nitroGLYCERIN 0.4 MG SL tablet Commonly known as: NITROSTAT Place 1 tablet (0.4 mg total) under the tongue every 5 (five) minutes as needed for chest pain.   oxyCODONE 5 MG immediate release tablet Commonly known as: Oxy IR/ROXICODONE Take 1 tablet (5 mg total) by mouth every 4 (  four) hours as needed for up to 3 days for moderate pain (pain score 4-6). What changed: how much to take   OXYGEN Inhale 4 L into the lungs See admin instructions. Oxygen at 4L hr via nasal cannula and will use portable oxygen machine when up in wheelchair.   pantoprazole 40 MG tablet Commonly known as: PROTONIX Take 1 tablet (40 mg total) by mouth daily.   polyethylene glycol 17 g packet Commonly known as: MIRALAX / GLYCOLAX Take 17 g by mouth daily as needed for mild constipation.   polyvinyl alcohol 1.4 % ophthalmic solution Commonly known as: LIQUIFILM TEARS Place 1 drop into both eyes as needed for dry eyes.   pregabalin 50 MG capsule Commonly known as: LYRICA Take 1 capsule (50 mg total) by mouth daily for 5 days.   sevelamer carbonate 2.4 g Pack Commonly known as: RENVELA Take 4.8 g by mouth 3 (three) times daily with meals.   simethicone 80 MG  chewable tablet Commonly known as: MYLICON Chew 80 mg by mouth every 6 (six) hours as needed for flatulence. What changed: Another medication with the same name was removed. Continue taking this medication, and follow the directions you see here.   sodium chloride 0.9 % SOLN 100 mL with sodium thiosulfate 250 MG/ML SOLN 12.5 g Inject 12.5 g into the vein every Monday, Wednesday, and Friday with hemodialysis.               Discharge Care Instructions  (From admission, onward)           Start     Ordered   05/27/22 0000  Discharge wound care:       Comments: 1.         R ischium clean with NS, dry, cut piece of Silver Hydrofiber Kellie Simmering [phone]) daily as patient will allow and cover with foam dressing.  2.         R abdomen clean with NS, cut piece of Xeroform gauze Kellie Simmering [phone]) and apply to wound bed daily as patient will allow and cover with foam dressing.  3.         Left lower quadrant clean with NS, cut piece of Silver Hydrdofiber Kellie Simmering [phone]) daily as patient will allow and cover with foam dressing.  Change all foam dressings q3 days and prn soilin   05/27/22 0944            Contact information for after-discharge care     Destination     [facility] Preferred SNF .   Service: Skilled Nursing Contact information: [place] [address] [phone]                    Allergies  Allergen Reactions   Benzonatate Other (See Comments)    Hallucinations   Hydralazine Other (See Comments)    Hallucinations   Amoxicillin Itching and Nausea And Vomiting   Clonidine Other (See Comments)    Altered mental state, insomnia    Chlorhexidine Itching    Patient reports it is the CHG baths that cause her itching not the CHG used for caring for her IV/HD access.     Morphine Itching   Oxycodone Itching    Pt tolerated hospital admission 07/2021    Consultations: General surgery Critical  care Nephrology   Procedures/Studies: No results found. (Echo, Carotid, EGD, Colonoscopy, ERCP)    Subjective: Patient seen in the morning rounds.  No other overnight events.  She  is worried about bleeding again.  Currently no bleeding.  I counseled to her that she has some open wounds and there is a risk of bleeding but we have no way to know it in advance.  Continue protective and adhesive dressings. Complain of constipation, will use lactulose.   Discharge Exam: Vitals:   05/28/22 1027 05/28/22 1036  BP: (!) 91/52 (!) 145/66  Pulse: (!) 50 (!) 51  Resp: 14 15  Temp:    SpO2: 100% 100%   Vitals:   05/28/22 0937 05/28/22 1007 05/28/22 1027 05/28/22 1036  BP: (!) 187/55 (!) 159/121 (!) 91/52 (!) 145/66  Pulse: (!) 49 (!) 47 (!) 50 (!) 51  Resp: 13 16 14 15  $ Temp:      TempSrc:      SpO2: 100% 100% 100% 100%  Weight:        General: Pt is alert, awake, not in acute distress Looks comfortable at rest.  She is anxious to move around. Cardiovascular: RRR, S1/S2 +, no rubs, no gallops Respiratory: CTA bilaterally, no wheezing, no rhonchi, permacath on the left chest wall. Abdominal: Soft, NT, distended and obese.  Pendulous. Left lower abdominal wall with chronic wound, no drainage or bleeding. On occlusive dressing   Extremities: right AKA, left high AKA clean and dry.    The results of significant diagnostics from this hospitalization (including imaging, microbiology, ancillary and laboratory) are listed below for reference.     Microbiology: No results found for this or any previous visit (from the past 240 hour(s)).   Labs: BNP (last 3 results) Recent Labs    07/09/21 1105 10/27/21 0016  BNP 1,448.0* 0000000*   Basic Metabolic Panel: Recent Labs  Lab 05/25/22 1436 05/25/22 1707 05/26/22 0440 05/27/22 0600 05/28/22 0858  NA 137 136 137 139 136  K 5.4* 5.5* 5.5* 4.8 5.1  CL 95* 99 94* 96* 94*  CO2 24  --  26 25 23  $ GLUCOSE 106* 84 85 77 85  BUN 38*  38* 50* 30* 45*  CREATININE 7.56* 8.70* 8.60* 5.97* 8.19*  CALCIUM 9.5  --  9.5 9.6 9.3  PHOS  --   --   --  6.3* 7.4*   Liver Function Tests: Recent Labs  Lab 05/26/22 0440 05/27/22 0600 05/28/22 0858  AST 12*  --   --   ALT 10  --   --   ALKPHOS 52  --   --   BILITOT 0.6  --   --   PROT 6.6  --   --   ALBUMIN 3.0* 3.0* 3.2*   No results for input(s): "LIPASE", "AMYLASE" in the last 168 hours. No results for input(s): "AMMONIA" in the last 168 hours. CBC: Recent Labs  Lab 05/25/22 1436 05/25/22 1707 05/25/22 2310 05/26/22 0440 05/27/22 0600 05/28/22 0858  WBC 8.5  --  11.1* 9.4 9.9 8.3  NEUTROABS 6.1  --   --   --  6.5  --   HGB 8.9* 9.9* 9.1* 8.8* 8.7* 8.2*  HCT 29.6* 29.0* 28.7* 27.3* 28.7* 26.3*  MCV 92.2  --  91.1 91.0 93.8 93.6  PLT 176  --  150 151 152 153   Cardiac Enzymes: No results for input(s): "CKTOTAL", "CKMB", "CKMBINDEX", "TROPONINI" in the last 168 hours. BNP: Invalid input(s): "POCBNP" CBG: No results for input(s): "GLUCAP" in the last 168 hours. D-Dimer No results for input(s): "DDIMER" in the last 72 hours. Hgb A1c No results for input(s): "HGBA1C" in the last 72 hours.  Lipid Profile No results for input(s): "CHOL", "HDL", "LDLCALC", "TRIG", "CHOLHDL", "LDLDIRECT" in the last 72 hours. Thyroid function studies No results for input(s): "TSH", "T4TOTAL", "T3FREE", "THYROIDAB" in the last 72 hours.  Invalid input(s): "FREET3" Anemia work up No results for input(s): "VITAMINB12", "FOLATE", "FERRITIN", "TIBC", "IRON", "RETICCTPCT" in the last 72 hours. Urinalysis No results found for: "COLORURINE", "APPEARANCEUR", "LABSPEC", "PHURINE", "GLUCOSEU", "HGBUR", "BILIRUBINUR", "KETONESUR", "PROTEINUR", "UROBILINOGEN", "NITRITE", "LEUKOCYTESUR" Sepsis Labs Recent Labs  Lab 05/25/22 2310 05/26/22 0440 05/27/22 0600 05/28/22 0858  WBC 11.1* 9.4 9.9 8.3   Microbiology No results found for this or any previous visit (from the past 240  hour(s)).   Time coordinating discharge: 35 minutes  SIGNED:   Barb Merino, MD  [facility] 05/28/2022, 10:47 AM

## 2022-05-27 NOTE — TOC Progression Note (Addendum)
Transition of Care [facility]) - Progression Note    Patient Details  Name: Alajia Tait MRN: [id number] Date of Birth: 02-Sep-1970  Transition of Care [facility]) CM/SW [place], [place] Phone Number: 05/27/2022, 1:21 PM  Clinical Narrative:      CSW spoke with patient who informed CSW PTA she came from [facility] short term. Patient agreeable to return to receive rehab.CSW spoke with Raquel Sarna from [facility] Patient comes from [facility] short term. CSW awaiting PT/OT eval to start insurance authorization for patient. CSW will continue to follow and assist with patients dc planning needs.  Update CSW started insurance authorization for patient. Reference # [id number]. Insurance authorization currently pending. CSW informed MD.  Expected Discharge Plan: [place] Barriers to Discharge: Continued Medical Work up  Expected Discharge Plan and Services In-house Referral: Clinical Social Work   Post Acute Care Choice: [facility] Living arrangements for the past 2 months: [place] Expected Discharge Date: 05/27/22                                     Social Determinants of Health (SDOH) Interventions SDOH Screenings   Food Insecurity: No Food Insecurity (12/11/2021)  Housing: Low Risk  (12/11/2021)  Transportation Needs: No Transportation Needs (12/11/2021)  Utilities: At Risk (12/11/2021)  Tobacco Use: Low Risk  (05/25/2022)    Readmission Risk Interventions    09/16/2021    4:36 PM 08/28/2021   11:50 AM 07/12/2021   12:38 PM  Readmission Risk Prevention Plan  Transportation Screening Complete Complete Complete  Medication Review [employer] photographer)  Complete Complete  PCP or Specialist appointment within 3-5 days of discharge  Complete Complete  HRI or Home Care Consult Complete Complete Complete  SW Recovery Care/Counseling Consult Complete Complete Complete  Palliative Care Screening  Not Applicable Not Applicable  Skilled  Nursing Facility Complete Not Applicable Complete

## 2022-05-27 NOTE — Progress Notes (Addendum)
D/C order noted. Contacted [place] SW to advise clinic of pt's d/c today and that pt should resume care tomorrow.   Melven Sartorius Renal Navigator [phone]  Addendum at 1:21 pm: Advised by CSW that insurance auth for snf is needed for pt to return to snf. PT to see pt and snf auth be submitted. Contacted [place] SW to advise clinic that pt will not be at treatment tomorrow as thought. Update provided to renal NP as well.

## 2022-05-27 NOTE — Evaluation (Signed)
Occupational Therapy Evaluation Patient Details Name: Courtney Gray MRN: [id number] DOB: 1970/11/06 Today's Date: 05/27/2022   History of Present Illness 52 y.o. female presents to [facility] hospital on 05/25/2022 with bleeding from L anterior abdominal wall wound. PMH includes DMII, IBS, HTN, ESRD, chronic respiratory failure, CHF, bilateral AKA, PAD, CAD, HLD, calciphylaxis.   Clinical Impression   Patient admitted for the diagnosis above.  PTA she was residing at a local SNF undergoing short term rehab.  Currently she is needing up to Mod A for ADL completion at bedlevel or sitting edge of bed, and Mod A for lateral scooting due to fatigue, and not feeling well this date.  Patient describes a headache, lack of sleep and not feeling hungry.  OT recommends and defers post acute rehab to the next level of care.  Patient is motivated, and has the potential to achieve close to Mod I level at power chair level.  SNF recommended to maximize the patient's functional status and quality of life.  Patient does not have the level of assist to transition directly home, and SNF can assist with ultimate discharge disposition.        Recommendations for follow up therapy are one component of a multi-disciplinary discharge planning process, led by the attending physician.  Recommendations may be updated based on patient status, additional functional criteria and insurance authorization.   Follow Up Recommendations  Skilled nursing-short term rehab (<3 hours/day)     Assistance Recommended at Discharge Intermittent Supervision/Assistance  Patient can return home with the following Assist for transportation;Assistance with cooking/housework;A lot of help with walking and/or transfers;A lot of help with bathing/dressing/bathroom    Functional Status Assessment  Patient has had a recent decline in their functional status and demonstrates the ability to make significant improvements in function in a reasonable and  predictable amount of time.  Equipment Recommendations  None recommended by OT    Recommendations for Other Services       Precautions / Restrictions Precautions Precautions: Fall Precaution Comments: bil AKA, 2L O2 at baseline, multiple abdominal/flank wounds Restrictions Weight Bearing Restrictions: No Other Position/Activity Restrictions: bilateral AKA, sacral wound      Mobility Bed Mobility Overal bed mobility: Needs Assistance Bed Mobility: Supine to Sit, Sit to Supine     Supine to sit: Supervision, HOB elevated Sit to supine: Supervision, HOB elevated     Patient Response: Cooperative  Transfers                          Balance Overall balance assessment: Needs assistance Sitting-balance support: No upper extremity supported, Feet supported Sitting balance-[name] Scale: Good                                     ADL either performed or assessed with clinical judgement   ADL Overall ADL's : Needs assistance/impaired Eating/Feeding: Independent;Bed level;Sitting   Grooming: Set up;Sitting;Oral care;Applying deodorant   Upper Body Bathing: Set up;Bed level;Sitting   Lower Body Bathing: Moderate assistance;Sitting/lateral leans;Bed level   Upper Body Dressing : Set up;Bed level   Lower Body Dressing: Moderate assistance;Bed level;Sitting/lateral leans                       Vision Baseline Vision/History: 1 Wears glasses Patient Visual Report: No change from baseline       Perception     Praxis  Pertinent Vitals/Pain Pain Assessment Pain Assessment: Faces Faces Pain Scale: Hurts little more Pain Location: abdomen Pain Descriptors / Indicators: Aching Pain Intervention(s): Monitored during session     Hand Dominance Left   Extremity/Trunk Assessment Upper Extremity Assessment Upper Extremity Assessment: Generalized weakness   Lower Extremity Assessment Lower Extremity Assessment: Defer to PT  evaluation RLE Deficits / Details: AKA, ROM WFL, hip strength at least 4/-5 based on observation LLE Deficits / Details: AKA, ROM WFL, hip strength at least 4/-5 based on observation   Cervical / Trunk Assessment Cervical / Trunk Assessment: Other exceptions Cervical / Trunk Exceptions: increased body habitus   Communication Communication Communication: No difficulties   Cognition Arousal/Alertness: Awake/alert Behavior During Therapy: WFL for tasks assessed/performed Overall Cognitive Status: Within Functional Limits for tasks assessed                                       General Comments  VSS on RA, dressing intact and dry on left abdominal wound    Exercises     Shoulder Instructions      Home Living Family/patient expects to be discharged to:: Skilled nursing facility                                        Prior Functioning/Environment Prior Level of Function : Needs assist             Mobility Comments: pt is able to transfer from bed to wheelchair with supervision of staff when feeling well, often requires staff assist or use of lift after dialysis. Pt mobilizes independently in [place] ADLs Comments: assistance as needed with ADLs from staff, generally starts out at bedlevel, then grooming and upperbody dress at [facility] level        OT Problem List: Decreased strength;Decreased range of motion;Decreased activity tolerance;Impaired balance (sitting and/or standing);Decreased coordination;Decreased cognition;Decreased safety awareness;Decreased knowledge of use of DME or AE;Decreased knowledge of precautions;Obesity;Pain      OT Treatment/Interventions: Therapeutic exercise;Self-care/ADL training;Energy conservation;DME and/or AE instruction;Manual therapy;Therapeutic activities;Cognitive remediation/compensation;Patient/family education;Balance training    OT Goals(Current goals can be found in the care plan section) Acute Rehab OT  Goals Patient Stated Goal: Get back to facility for rehab OT Goal Formulation: With patient Time For Goal Achievement: 06/05/22 Potential to Achieve Goals: Good  OT Frequency:      Co-evaluation              AM-PAC OT "6 Clicks" Daily Activity     Outcome Measure Help from another person eating meals?: None Help from another person taking care of personal grooming?: None Help from another person toileting, which includes using toliet, bedpan, or urinal?: A Lot Help from another person bathing (including washing, rinsing, drying)?: A Lot Help from another person to put on and taking off regular upper body clothing?: A Little Help from another person to put on and taking off regular lower body clothing?: A Lot 6 Click Score: 17   End of Session Equipment Utilized During Treatment: Oxygen Nurse Communication: Mobility status  Activity Tolerance: Patient tolerated treatment well Patient left: in bed;with call bell/phone within reach  OT Visit Diagnosis: Muscle weakness (generalized) (M62.81);History of falling (Z91.81)                Time: [id number] OT Time Calculation (min): 17 min  Charges:  OT General Charges $OT Visit: 1 Visit OT Evaluation $OT Eval Moderate Complexity: 1 Mod  05/27/2022  RP, OTR/L  Acute Rehabilitation Services  Office:  [phone]   Metta Clines 05/27/2022, 3:09 PM

## 2022-05-27 NOTE — Progress Notes (Signed)
[facility] Progress Note   Subjective:    Seen and examined patient at bedside. Seen sitting up in bed. Tolerated yesterday's HD with net UF 2.5L. She denies SOB, CP, N/V, and active signs for bleeding. She expresses anxiety about her ABD wounds to possibly re-occur. Noted dc order for today.   Objective Vitals:   05/27/22 0746 05/27/22 0800 05/27/22 1017 05/27/22 1136  BP: (!) 143/53 115/70  (!) 156/64  Pulse:  67    Resp: 15 13  16  $ Temp: 98.6 F (37 C) 97.7 F (36.5 C)  (!) 97.5 F (36.4 C)  TempSrc: Axillary Axillary  Oral  SpO2:  100% 100% 100%  Weight:       Physical Exam General: Awake, alert, on RA, NAD Heart: S1 and S2; No MRGs Lungs: Clear throughout; No wheezing, rales, or rhonchi Abdomen: Large, soft, noted ABD dressing-no active signs for bleeding Extremities: Bilateral AKAs, no edema BL stumps Dialysis Access: L [facility]   Filed Weights   05/26/22 0800 05/26/22 1204 05/27/22 0352  Weight: 118.8 kg 112.3 kg 116.5 kg    Intake/Output Summary (Last 24 hours) at 05/27/2022 1407 Last data filed at 05/26/2022 2200 Gross per 24 hour  Intake 250 ml  Output --  Net 250 ml    Additional Objective Labs: Basic Metabolic Panel: Recent Labs  Lab 05/25/22 1436 05/25/22 1707 05/26/22 0440 05/27/22 0600  NA 137 136 137 139  K 5.4* 5.5* 5.5* 4.8  CL 95* 99 94* 96*  CO2 24  --  26 25  GLUCOSE 106* 84 85 77  BUN 38* 38* 50* 30*  CREATININE 7.56* 8.70* 8.60* 5.97*  CALCIUM 9.5  --  9.5 9.6  PHOS  --   --   --  6.3*   Liver Function Tests: Recent Labs  Lab 05/26/22 0440 05/27/22 0600  AST 12*  --   ALT 10  --   ALKPHOS 52  --   BILITOT 0.6  --   PROT 6.6  --   ALBUMIN 3.0* 3.0*   No results for input(s): "LIPASE", "AMYLASE" in the last 168 hours. CBC: Recent Labs  Lab 05/25/22 1436 05/25/22 1707 05/25/22 2310 05/26/22 0440 05/27/22 0600  WBC 8.5  --  11.1* 9.4 9.9  NEUTROABS 6.1  --   --   --  6.5  HGB 8.9*   < > 9.1* 8.8* 8.7*   HCT 29.6*   < > 28.7* 27.3* 28.7*  MCV 92.2  --  91.1 91.0 93.8  PLT 176  --  150 151 152   < > = values in this interval not displayed.   Blood Culture    Component Value Date/Time   SDES BLOOD LEFT ANTECUBITAL 10/16/2021 1534   SPECREQUEST  10/16/2021 1534    BOTTLES DRAWN AEROBIC AND ANAEROBIC Blood Culture results may not be optimal due to an inadequate volume of blood received in culture bottles   CULT  10/16/2021 1534    NO GROWTH 5 DAYS Performed at [facility], [place] [address], [place], [place] [address]    REPTSTATUS 10/21/2021 FINAL 10/16/2021 1534    Cardiac Enzymes: No results for input(s): "CKTOTAL", "CKMB", "CKMBINDEX", "TROPONINI" in the last 168 hours. CBG: No results for input(s): "GLUCAP" in the last 168 hours. Iron Studies: No results for input(s): "IRON", "TIBC", "TRANSFERRIN", "FERRITIN" in the last 72 hours. Lab Results  Component Value Date   INR 1.3 (H) 05/25/2022   INR 1.3 (H) 10/09/2021  INR 1.3 (H) 09/10/2021   Studies/Results: No results found.  Medications:  sodium thiosulfate 12.5 g in sodium chloride 0.9 % 150 mL 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 (05/26/22 1816)    aspirin EC  81 mg Oral Daily   atorvastatin  10 mg Oral QHS   lactulose  30 g Oral Once   levothyroxine  100 mcg Oral Daily   midodrine  10 mg Oral Q M,W,F-2000   pantoprazole  40 mg Oral Daily   polyethylene glycol  17 g Oral Daily   sodium chloride flush  10-40 mL Intracatheter Q12H   sodium chloride flush  3 mL Intravenous Q12H   sodium zirconium cyclosilicate  10 g Oral Once    Dialysis Orders: MWF SW 4h  400/1.5   116kg  2/2 bath  Hep 5000  [facility] - last HD 2/16, post wt 117.2kg   - sodium thiosulfate 12.5 gm IV tiw - venofer 141m each HD 2/21 thru 3/13 - mircera 225 mcg IV q 2wks, last on 2/14, due on 2/28   Home meds include - aspirin, lipitor, colace, pepcid, lactulose, synthroid, midodrine 10 mg pre hd mwf, 4L [place] chronic home O2, protonix, fentanyl  patch 25 ug q 3d, gabapentin 100 hs, oxy IR prn, lyrica, renvela 4.8gm ac tid, velphoro 1 gm ac tid, prns/ vits/ supps  Assessment/Plan: Acute bleeding w/ shock - from LLQ skin wounds, pt is on eliquis, hx calciphylaxis causing bilat abd wounds. Seen by gen surgery and the bleeding wound was sutured. Pt also sp prbc's x 2.   Hypotension - Bps currently stable on HD. Monitor trend. ESRD - on HD MWF. Next HD 05/28/22. Hyperkalemia - Resolved with HD-K+ now 4.8. On HD. HTN/ volume - no gross vol overload on exam Anemia esrd - Hgb from 9.9 at admit to 8.7 here. Just got esa on 2/14, next due on 2/28. S/p 2 units PRBCs. Follow hb MBD ckd - Ca in range, Po4 elevated, will continue Renvela if she is still here.  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 Plan for her to receive HD here tomorrow.  CTobie Poet NP [facility] 05/27/2022,2:07 PM  LOS: 0 days

## 2022-05-27 NOTE — Evaluation (Signed)
Physical Therapy Evaluation Patient Details Name: Corinn Lariccia MRN: [id number] DOB: 1970/04/09 Today's Date: 05/27/2022  History of Present Illness  52 y.o. female presents to [facility] hospital on 05/25/2022 with bleeding from L anterior abdominal wall wound. PMH includes DMII, IBS, HTN, ESRD, chronic respiratory failure, CHF, bilateral AKA, PAD, CAD, HLD, calciphylaxis.  Clinical Impression  Pt presents to PT with deficits in functional mobility, strength, power, endurance. Pt reports fatigue at this time, with impaired tolerance for scooting. Pt is able to mobilize in bed well and simulates portions of lateral scoot transfer with support of railings. Pt will benefit from continued acute PT services and post-acute PT at SNF in an effort to further improve independence with transfers, wheelchair mobility, and ADLs. PT recommends return to SNF when medically appropriate.       Recommendations for follow up therapy are one component of a multi-disciplinary discharge planning process, led by the attending physician.  Recommendations may be updated based on patient status, additional functional criteria and insurance authorization.  Follow Up Recommendations Skilled nursing-short term rehab (<3 hours/day) Can patient physically be transported by private vehicle: No    Assistance Recommended at Discharge Frequent or constant Supervision/Assistance  Patient can return home with the following  A little help with walking and/or transfers;Assistance with cooking/housework;A lot of help with bathing/dressing/bathroom;Direct supervision/assist for medications management;Direct supervision/assist for financial management;Assist for transportation;Help with stairs or ramp for entrance    Equipment Recommendations None recommended by PT (pt with loaner PWC, plan to obtain her own PWC when nearing end of SNF rehab)  Recommendations for Other Services       Functional Status Assessment Patient has had a recent  decline in their functional status and demonstrates the ability to make significant improvements in function in a reasonable and predictable amount of time.     Precautions / Restrictions Precautions Precautions: Fall Precaution Comments: bil AKA, 2L O2 at baseline, multiple abdominal/flank wounds Restrictions Weight Bearing Restrictions: No Other Position/Activity Restrictions: bilateral AKA, sacral wound      Mobility  Bed Mobility Overal bed mobility: Needs Assistance Bed Mobility: Supine to Sit, Sit to Supine     Supine to sit: Supervision, HOB elevated Sit to supine: Supervision, HOB elevated   General bed mobility comments: pt in long sitting upon arrival, mobilizes in bed with support of railings    Transfers Overall transfer level: Needs assistance Equipment used: None Transfers: Bed to chair/wheelchair/BSC            Lateral/Scoot Transfers:  (pt scoots laterally in bed, declines out of bed transfer at this time due to fatigue, slides ~1 foot to right side and back with supervision)      Ambulation/Gait                  Stairs            Wheelchair Mobility    Modified Rankin (Stroke Patients Only)       Balance Overall balance assessment: Needs assistance Sitting-balance support: No upper extremity supported, Feet supported Sitting balance-[name] Scale: Good                                       Pertinent Vitals/Pain Pain Assessment Pain Assessment: Faces Faces Pain Scale: Hurts little more Pain Location: abdomen Pain Descriptors / Indicators: Sore Pain Intervention(s): Monitored during session    Home Living Family/patient expects to be discharged  to:: Skilled nursing facility [facility])                        Prior Function Prior Level of Function : Needs assist             Mobility Comments: pt is able to transfer from bed to wheelchair with suypervision of staff when feeling well, often  requires staff assist or use of lift after dialysis. Pt mobilizes independently in [place] ADLs Comments: assistance with ADLs from staff     Hand Dominance   Dominant Hand: Left    Extremity/Trunk Assessment   Upper Extremity Assessment Upper Extremity Assessment: Overall WFL for tasks assessed    Lower Extremity Assessment Lower Extremity Assessment: RLE deficits/detail;LLE deficits/detail RLE Deficits / Details: AKA, ROM WFL, hip strength at least 4/-5 based on observation LLE Deficits / Details: AKA, ROM WFL, hip strength at least 4/-5 based on observation    Cervical / Trunk Assessment Cervical / Trunk Assessment: Other exceptions Cervical / Trunk Exceptions: increased body habitus  Communication   Communication: No difficulties  Cognition Arousal/Alertness: Awake/alert Behavior During Therapy: WFL for tasks assessed/performed Overall Cognitive Status: Within Functional Limits for tasks assessed                                          General Comments General comments (skin integrity, edema, etc.): VSS on RA, dressing intact and dry on left abdominal wound    Exercises     Assessment/Plan    PT Assessment Patient needs continued PT services  PT Problem List Decreased strength;Decreased activity tolerance;Decreased mobility;Decreased balance;Pain       PT Treatment Interventions DME instruction;Functional mobility training;Therapeutic activities;Therapeutic exercise;Balance training;Neuromuscular re-education;Cognitive remediation;Patient/family education;Wheelchair mobility training    PT Goals (Current goals can be found in the Care Plan section)  Acute Rehab PT Goals Patient Stated Goal: to return to SNF, become more independent with transfers and mobility PT Goal Formulation: With patient Time For Goal Achievement: 06/10/22 Potential to Achieve Goals: Fair Additional Goals Additional Goal #1: Pt will mobilize in a power wheelchair for  household distances at a modI level    Frequency Min 2X/week     Co-evaluation               AM-PAC PT "6 Clicks" Mobility  Outcome Measure Help needed turning from your back to your side while in a flat bed without using bedrails?: A Little Help needed moving from lying on your back to sitting on the side of a flat bed without using bedrails?: A Little Help needed moving to and from a bed to a chair (including a wheelchair)?: A Lot Help needed standing up from a chair using your arms (e.g., wheelchair or bedside chair)?: A Lot Help needed to walk in hospital room?: Total Help needed climbing 3-5 steps with a railing? : Total 6 Click Score: 12    End of Session Equipment Utilized During Treatment: Oxygen Activity Tolerance: Patient tolerated treatment well Patient left: in bed;with call bell/phone within reach Nurse Communication: Mobility status;Need for lift equipment PT Visit Diagnosis: Other abnormalities of gait and mobility (R26.89);Muscle weakness (generalized) (M62.81) Pain - Right/Left: Left Pain - part of body:  (abdomen)    Time: [id number] PT Time Calculation (min) (ACUTE ONLY): 29 min   Charges:   PT Evaluation $PT Eval Moderate Complexity: 1 Mod  Zenaida Niece, PT, DPT Acute Rehabilitation Office [place] 05/27/2022, 2:12 PM

## 2022-05-27 NOTE — NC FL2 (Signed)
[place] LEVEL OF CARE FORM     IDENTIFICATION  Patient Name: Courtney Gray Birthdate: 10-27-1970 Sex: female Admission Date (Current Location): 05/25/2022  [facility] and [place] Number:  Herbalist and Address:  [facility] [place], [place] [address] [place], [place] [address]      Provider Number: [id number]  Attending Physician Name and Address:  Barb Merino, MD  Relative Name and Phone Number:  Lorenso Courier (Daughter) [phone]    Current Level of Care: Hospital Recommended Level of Care: [place] Prior Approval Number:    Date Approved/Denied:   PASRR Number: [id number]  Discharge Plan: SNF    Current Diagnoses: Patient Active Problem List   Diagnosis Date Noted   Open abdominal wall wound 05/25/2022   Hemorrhagic shock ([place]) 05/25/2022   Nodule of skin of abdomen 02/16/2022   Chronic diastolic CHF (congestive heart failure) ([place]) 12/12/2021   PAD (peripheral artery disease) ([place]) 12/12/2021   Irritable bowel syndrome (IBS) 12/12/2021   Amputation of left middle finger 12/12/2021   GERD (gastroesophageal reflux disease) 12/12/2021   Hypertension    Type 2 diabetes mellitus with hyperlipidemia ([place])    Decubitus ulcer of sacral region, stage 3 ([place])    PVD (peripheral vascular disease) ([place])    Hepatic abscess    Pressure injury of skin 09/13/2021   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[place])    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   Mixed diabetic hyperlipidemia associated with type 2 diabetes mellitus ([place]) 09/10/2021   Coronary artery disease involving native coronary artery of native heart without angina pectoris 09/10/2021   Dry gangrene ([place])    Pain 08/03/2021   Volume overload 07/09/2021   Acute gout 06/11/2021   Hemorrhoids    Rectal bleeding 05/17/2021   Morbid obesity with BMI of 60.0-69.9, adult ([place]) 05/17/2021   NSTEMI (non-ST elevated myocardial infarction) ([place]) 05/16/2021   End stage  renal disease ([place]) 09/01/2020   Chronic respiratory failure with hypoxia ([place]) 09/01/2020   Hypertensive urgency 11/28/2019   Anemia due to chronic kidney disease 11/28/2019    Orientation RESPIRATION BLADDER Height & Weight     Self, Time, Situation, Place  O2 (Nasal cannula 2 liters) Continent Weight: 256 lb 13.4 oz (116.5 kg) Height:     BEHAVIORAL SYMPTOMS/MOOD NEUROLOGICAL BOWEL NUTRITION STATUS      Continent Diet (Please see discharge summary)  AMBULATORY STATUS COMMUNICATION OF NEEDS Skin   Limited Assist Verbally Other (Comment) (Wound/Incision LDAs,PI Ischial tuberosity,R,stage 4,4 cmsx2cmsx5cms,Wound/Incision open or dehiced non-pressure wound,abdomen,L,Lower,4cmsx3cmsx0.8cms full thickness,See additional info)                       Personal Care Assistance Level of Assistance    Bathing Assistance: Maximum assistance Feeding assistance: Independent Dressing Assistance: Maximum assistance     Functional Limitations Info  Sight, Hearing, Speech Sight Info: Impaired Hearing Info: Adequate Speech Info: Adequate    SPECIAL CARE FACTORS FREQUENCY  PT (By licensed PT), OT (By licensed OT)     PT Frequency: 5x min weekly OT Frequency: 5x min weekly            Contractures Contractures Info: Not present    Additional Factors Info  Code Status, Allergies Code Status Info: FULL Allergies Info: Benzonatate,Hydralazine,Amoxicillin,Clonidine,Chlorhexidine,Morphine,Oxycodone           Current Medications (05/27/2022):  This is the current hospital active medication list Current Facility-Administered Medications  Medication Dose Route Frequency Provider Last Rate Last Admin  acetaminophen (TYLENOL) tablet 650 mg  650 mg Oral Q6H PRN Marcelyn Bruins, MD   650 mg at 05/26/22 0454   Or   acetaminophen (TYLENOL) suppository 650 mg  650 mg Rectal Q6H PRN Marcelyn Bruins, MD       aspirin EC tablet 81 mg  81 mg Oral Daily Marcelyn Bruins, MD   81 mg  at 05/27/22 1005   atorvastatin (LIPITOR) tablet 10 mg  10 mg Oral QHS Marcelyn Bruins, MD   10 mg at 05/26/22 2050   lactulose (CHRONULAC) 10 GM/15ML solution 30 g  30 g Oral Once Barb Merino, MD       levothyroxine (SYNTHROID) tablet 100 mcg  100 mcg Oral Daily Marcelyn Bruins, MD   100 mcg at 05/27/22 [id number]   loperamide (IMODIUM) capsule 4 mg  4 mg Oral PRN Marcelyn Bruins, MD       midodrine (PROAMATINE) tablet 10 mg  10 mg Oral Q M,W,F-2000 Marcelyn Bruins, MD   10 mg at 05/26/22 [id number]   oxyCODONE (Oxy IR/ROXICODONE) immediate release tablet 5 mg  5 mg Oral Q4H PRN Shela Leff, MD   5 mg at 05/27/22 1005   pantoprazole (PROTONIX) EC tablet 40 mg  40 mg Oral Daily Marcelyn Bruins, MD   40 mg at 05/27/22 1005   polyethylene glycol (MIRALAX / GLYCOLAX) packet 17 g  17 g Oral Daily Barb Merino, MD       polyvinyl alcohol (LIQUIFILM TEARS) 1.4 % ophthalmic solution 1 drop  1 drop Both Eyes PRN Marcelyn Bruins, MD       simethicone [facility]) chewable tablet 80 mg  80 mg Oral Q6H PRN Marcelyn Bruins, MD   80 mg at 05/27/22 [id number]   sodium chloride flush (NS) 0.9 % injection 10-40 mL  10-40 mL Intracatheter Q12H Shela Leff, MD   10 mL at 05/26/22 2050   sodium chloride flush (NS) 0.9 % injection 10-40 mL  10-40 mL Intracatheter PRN Shela Leff, MD       sodium chloride flush (NS) 0.9 % injection 3 mL  3 mL Intravenous Q12H Marcelyn Bruins, MD   3 mL at 05/26/22 2050   sodium thiosulfate 12.5 g in sodium chloride 0.9 % 150 mL Infusion for Calciphylaxis  12.5 g Intravenous Q M,W,F-HD Tobie Poet E, NP 150 mL/hr at 05/26/22 1816 12.5 g at 05/26/22 1816   sodium zirconium cyclosilicate (LOKELMA) packet 10 g  10 g Oral Once Roney Jaffe, MD         Discharge Medications: Please see discharge summary for a list of discharge medications.  Relevant Imaging Results:  Relevant Lab Results:   Additional Information [id number],  Wound/Incision open or dehiced non-pressure wound flank,lower,R,lateral,5 cms x 1 cms x 0.1 cms full thickness  Milas Gain, LCSWA

## 2022-05-28 DIAGNOSIS — S31134A Puncture wound of abdominal wall without foreign body, left lower quadrant without penetration into peritoneal cavity, initial encounter: Secondary | ICD-10-CM | POA: Diagnosis not present

## 2022-05-28 LAB — CBC
HCT: 26.3 % — ABNORMAL LOW (ref 36.0–46.0)
Hemoglobin: 8.2 g/dL — ABNORMAL LOW (ref 12.0–15.0)
MCH: 29.2 pg (ref 26.0–34.0)
MCHC: 31.2 g/dL (ref 30.0–36.0)
MCV: 93.6 fL (ref 80.0–100.0)
Platelets: 153 10*3/uL (ref 150–400)
RBC: 2.81 MIL/uL — ABNORMAL LOW (ref 3.87–5.11)
RDW: 17.8 % — ABNORMAL HIGH (ref 11.5–15.5)
WBC: 8.3 10*3/uL (ref 4.0–10.5)
nRBC: 0 % (ref 0.0–0.2)

## 2022-05-28 LAB — RENAL FUNCTION PANEL
Albumin: 3.2 g/dL — ABNORMAL LOW (ref 3.5–5.0)
Anion gap: 19 — ABNORMAL HIGH (ref 5–15)
BUN: 45 mg/dL — ABNORMAL HIGH (ref 6–20)
CO2: 23 mmol/L (ref 22–32)
Calcium: 9.3 mg/dL (ref 8.9–10.3)
Chloride: 94 mmol/L — ABNORMAL LOW (ref 98–111)
Creatinine, Ser: 8.19 mg/dL — ABNORMAL HIGH (ref 0.44–1.00)
GFR, Estimated: 5 mL/min — ABNORMAL LOW (ref >60)
Glucose, Bld: 85 mg/dL (ref 70–99)
Phosphorus: 7.4 mg/dL — ABNORMAL HIGH (ref 2.5–4.6)
Potassium: 5.1 mmol/L (ref 3.5–5.1)
Sodium: 136 mmol/L (ref 135–145)

## 2022-05-28 MED ORDER — SEVELAMER CARBONATE 2.4 G PO PACK
2.4000 g | PACK | Freq: Three times a day (TID) | ORAL | Status: DC
  Filled 2022-05-28 (×2): qty 1

## 2022-05-28 MED ORDER — ONDANSETRON HCL 4 MG/2ML IJ SOLN
4.0000 mg | Freq: Once | INTRAMUSCULAR | Status: AC
  Administered 2022-05-28: 4 mg via INTRAVENOUS
  Filled 2022-05-28: qty 2

## 2022-05-28 MED ORDER — HEPARIN SODIUM (PORCINE) 1000 UNIT/ML IJ SOLN
INTRAMUSCULAR | Status: AC
  Administered 2022-05-28: 4200 [IU]
  Filled 2022-05-28: qty 5

## 2022-05-28 NOTE — Progress Notes (Signed)
Call [facility] to give report at [id number] with no answer.  Daymon Larsen, RN

## 2022-05-28 NOTE — Progress Notes (Signed)
[facility] Progress Note   Subjective:    Seen and examined patient on HD. She reports fatigue, nausea, and headache. Denies SOB and CP. Current BP 169/62. UFG set for 2L. PRN Zofran given. Awaiting insurance authorization for her to return back to SNF.  Objective Vitals:   05/28/22 0745 05/28/22 0834 05/28/22 0848 05/28/22 0937  BP: (!) 154/53 (!) 156/61 (!) 150/62 (!) 187/55  Pulse:  (!) 57 (!) 58 (!) 49  Resp:  14 11 13  $ Temp: 97.9 F (36.6 C) 97.7 F (36.5 C)    TempSrc: Oral     SpO2:  100% 100% 100%  Weight:  118.8 kg     Physical Exam General: Awake, alert, on RA, NAD Heart: S1 and S2; No MRGs Lungs: Clear throughout; No wheezing, rales, or rhonchi Abdomen: Large, soft, noted ABD dressing-no active signs for bleeding Extremities: Bilateral AKAs, no edema BL stumps Dialysis Access: L IJ [facility]   Filed Weights   05/27/22 0352 05/28/22 0357 05/28/22 0834  Weight: 116.5 kg 116.5 kg 118.8 kg    Intake/Output Summary (Last 24 hours) at 05/28/2022 1001 Last data filed at 05/27/2022 1600 Gross per 24 hour  Intake 74.26 ml  Output 0 ml  Net 74.26 ml    Additional Objective Labs: Basic Metabolic Panel: Recent Labs  Lab 05/26/22 0440 05/27/22 0600 05/28/22 0858  NA 137 139 136  K 5.5* 4.8 5.1  CL 94* 96* 94*  CO2 26 25 23  $ GLUCOSE 85 77 85  BUN 50* 30* 45*  CREATININE 8.60* 5.97* 8.19*  CALCIUM 9.5 9.6 9.3  PHOS  --  6.3* 7.4*   Liver Function Tests: Recent Labs  Lab 05/26/22 0440 05/27/22 0600 05/28/22 0858  AST 12*  --   --   ALT 10  --   --   ALKPHOS 52  --   --   BILITOT 0.6  --   --   PROT 6.6  --   --   ALBUMIN 3.0* 3.0* 3.2*   No results for input(s): "LIPASE", "AMYLASE" in the last 168 hours. CBC: Recent Labs  Lab 05/25/22 1436 05/25/22 1707 05/25/22 2310 05/26/22 0440 05/27/22 0600 05/28/22 0858  WBC 8.5  --  11.1* 9.4 9.9 8.3  NEUTROABS 6.1  --   --   --  6.5  --   HGB 8.9*   < > 9.1* 8.8* 8.7* 8.2*  HCT 29.6*   <  > 28.7* 27.3* 28.7* 26.3*  MCV 92.2  --  91.1 91.0 93.8 93.6  PLT 176  --  150 151 152 153   < > = values in this interval not displayed.   Blood Culture    Component Value Date/Time   SDES BLOOD LEFT ANTECUBITAL 10/16/2021 1534   SPECREQUEST  10/16/2021 1534    BOTTLES DRAWN AEROBIC AND ANAEROBIC Blood Culture results may not be optimal due to an inadequate volume of blood received in culture bottles   CULT  10/16/2021 1534    NO GROWTH 5 DAYS Performed at [facility], [place] [address], [place],  [address]    REPTSTATUS 10/21/2021 FINAL 10/16/2021 1534    Cardiac Enzymes: No results for input(s): "CKTOTAL", "CKMB", "CKMBINDEX", "TROPONINI" in the last 168 hours. CBG: No results for input(s): "GLUCAP" in the last 168 hours. Iron Studies: No results for input(s): "IRON", "TIBC", "TRANSFERRIN", "FERRITIN" in the last 72 hours. Lab Results  Component Value Date   INR 1.3 (H) 05/25/2022   INR 1.3 (  H) 10/09/2021   INR 1.3 (H) 09/10/2021   Studies/Results: No results found.  Medications:  sodium thiosulfate 12.5 g in sodium chloride 0.9 % 150 mL Infusion for Calciphylaxis Stopped (05/26/22 1856)    aspirin EC  81 mg Oral Daily   atorvastatin  10 mg Oral QHS   heparin sodium (porcine)       lactulose  30 g Oral Once   levothyroxine  100 mcg Oral Daily   midodrine  10 mg Oral Q M,W,F-2000   pantoprazole  40 mg Oral Daily   polyethylene glycol  17 g Oral Daily   sodium chloride flush  10-40 mL Intracatheter Q12H   sodium chloride flush  3 mL Intravenous Q12H   sodium zirconium cyclosilicate  10 g Oral Once    Dialysis Orders: MWF SW 4h  400/1.5   116kg  2/2 bath  Hep 5000  [facility] - last HD 2/16, post wt 117.2kg   - sodium thiosulfate 12.5 gm IV tiw - venofer 161m each HD 2/21 thru 3/13 - mircera 225 mcg IV q 2wks, last on 2/14, due on 2/28   Home meds include - aspirin, lipitor, colace, pepcid, lactulose, synthroid, midodrine 10 mg pre hd mwf, 4L [place]  chronic home O2, protonix, fentanyl patch 25 ug q 3d, gabapentin 100 hs, oxy IR prn, lyrica, renvela 4.8gm ac tid, velphoro 1 gm ac tid, prns/ vits/ supps  Assessment/Plan: Acute bleeding w/ shock - from LLQ skin wounds, pt is on eliquis, hx calciphylaxis causing bilat abd wounds. Seen by gen surgery and the bleeding wound was sutured. Pt also sp prbc's x 2.   Hypotension - Bps currently stable on HD. Monitor trend. ESRD - on HD MWF. On HD. Hyperkalemia - K+ now 5.1. On HD. HTN/ volume - no gross vol overload on exam Anemia esrd - Hgb from 9.9 at admit to 8.2 here. Just got esa on 2/14, next due on 2/28. S/p 2 units PRBCs. Follow trend. MBD ckd - Ca in range, Po4 trending up: Renvela 2.4GM TID resumed per outpatient regimen. 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 On HD 1st shift today in hope she will be discharged later this afternoon.  CTobie Poet NP [facility] 05/28/2022,10:01 AM  LOS: 0 days

## 2022-05-28 NOTE — Progress Notes (Signed)
Chart/notes reviewed. Contacted [place] to advise clinic of pt's d/c today to snf and pt should resume care on Friday.   Melven Sartorius Renal Navigator [phone]

## 2022-05-28 NOTE — Progress Notes (Addendum)
Received patient in bed,alert ,awake and oriented x 4 .She has an oxygen at 2lpm/[place].She was weeping,denies pain,verbalized she wanted to talk to medical person.Renal N.P  at the bedside.  Access used :Left HD catheter that worked well.Dressing on date.  Duration of treatment : 3.5 hours  Medicines given: Midodrine 10 mg.                              Na Thisulfate 12.5 g IV.  Fluid removed: Met UF prescribed goal of 2,000 cc.  Hemo tx issue. None.  Hand off to the patient's nurse:

## 2022-05-28 NOTE — Progress Notes (Signed)
Per shift report from day nurse, patient's femoral central line to be dc'd  only when patient is discharged. Patient's discharge was cancelled 2/20, next expected discharge date is 2/21.

## 2022-05-28 NOTE — TOC Initial Note (Signed)
Transition of Care [facility]) - Initial/Assessment Note    Patient Details  Name: Courtney Gray MRN: [id number] Date of Birth: [birth year]  Transition of Care [facility]) CM/SW Contact:    Vinie Sill, LCSW Phone Number: 05/28/2022, 1:30 PM  Clinical Narrative:                  Patient will Discharge to: [facility]  Discharge Date: 05/28/22 Family Notified: patient informed family Transport By: Corey Harold  Per MD patient is ready for discharge. RN, patient, and facility notified of discharge. Discharge Summary sent to facility. RN given number for [phone]. Ambulance transport requested for patient.   Clinical Social Worker signing off.  Thurmond Butts, MSW, LCSW Clinical Social Worker     Expected Discharge Plan: Skilled Nursing Facility Barriers to Discharge: Barriers Resolved   Patient Goals and CMS Choice   CMS Medicare.gov Compare Post Acute Care list provided to:: Patient Choice offered to / list presented to : Patient      Expected Discharge Plan and Services In-house Referral: Clinical Social Work   Post Acute Care Choice: [place] Living arrangements for the past 2 months: [place] Expected Discharge Date: 05/27/22                                    Prior Living Arrangements/Services Living arrangements for the past 2 months: [place] Lives with:: Facility Resident   Do you feel safe going back to the place where you live?: Yes               Activities of Daily Living   ADL Screening (condition at time of admission) Patient's cognitive ability adequate to safely complete daily activities?: No Is the patient deaf or have difficulty hearing?: No Does the patient have difficulty seeing, even when wearing glasses/contacts?: No Does the patient have difficulty concentrating, remembering, or making decisions?: No Patient able to express need for assistance with ADLs?: Yes Does the patient  have difficulty dressing or bathing?: Yes Independently performs ADLs?: No Does the patient have difficulty walking or climbing stairs?: Yes Weakness of Legs: Both Weakness of Arms/Hands: Both  Permission Sought/Granted                  Emotional Assessment       Orientation: : Oriented to Self, Oriented to Place, Oriented to  Time, Oriented to Situation   Psych Involvement: No (comment)  Admission diagnosis:  Hemorrhagic shock ([place]) [R57.8] Visit for wound check [Z51.89] Open abdominal wall wound [S31.109A] Anemia, unspecified type [D64.9] Patient Active Problem List   Diagnosis Date Noted   Open abdominal wall wound 05/25/2022   Hemorrhagic shock ([place]) 05/25/2022   Nodule of skin of abdomen 02/16/2022   Chronic diastolic CHF (congestive heart failure) ([place]) 12/12/2021   PAD (peripheral artery disease) ([place]) 12/12/2021   Irritable bowel syndrome (IBS) 12/12/2021   Amputation of left middle finger 12/12/2021   GERD (gastroesophageal reflux disease) 12/12/2021   Hypertension    Type 2 diabetes mellitus with hyperlipidemia ([place])    Decubitus ulcer of sacral region, stage 3 ([place])    PVD (peripheral vascular disease) ([place])    Hepatic abscess    Pressure injury of skin 09/13/2021   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[place])    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   Mixed diabetic hyperlipidemia associated with type 2 diabetes mellitus ([place]) 09/10/2021   Coronary artery  disease involving native coronary artery of native heart without angina pectoris 09/10/2021   Dry gangrene ([place])    Pain 08/03/2021   Volume overload 07/09/2021   Acute gout 06/11/2021   Hemorrhoids    Rectal bleeding 05/17/2021   Morbid obesity with BMI of 60.0-69.9, adult ([place]) 05/17/2021   NSTEMI (non-ST elevated myocardial infarction) ([place]) 05/16/2021   End stage renal disease ([place]) 09/01/2020   Chronic respiratory failure with hypoxia ([place]) 09/01/2020   Hypertensive urgency 11/28/2019   Anemia due  to chronic kidney disease 11/28/2019   PCP:  Monico Blitz, MD Pharmacy:   [facility], [place] [address] [place] [address] Phone: [phone] Fax: [phone]  [facility] [address] [place] [address] Phone: [phone] Fax: [phone]  [place], [place] - [address] [place] [address] Phone: [phone] Fax: [phone]     Social Determinants of Health (SDOH) Social History: [place]: No Food Insecurity (12/11/2021)  Housing: Low Risk  (12/11/2021)  Transportation Needs: No Transportation Needs (12/11/2021)  Utilities: At Risk (12/11/2021)  Tobacco Use: Low Risk  (05/25/2022)   SDOH Interventions:     Readmission Risk Interventions    09/16/2021    4:36 PM 08/28/2021   11:50 AM 07/12/2021   12:38 PM  Readmission Risk Prevention Plan  Transportation Screening Complete Complete Complete  Medication Review Press photographer)  Complete Complete  PCP or Specialist appointment within 3-5 days of discharge  Complete Complete  HRI or Home Care Consult Complete Complete Complete  SW Recovery Care/Counseling Consult Complete Complete Complete  Palliative Care Screening  Not Applicable Not Applicable  Skilled Nursing Facility Complete Not Applicable Complete

## 2022-05-28 NOTE — Progress Notes (Signed)
Patient picked up by [facility] for transport to [facility]. CVC femoral removed. Telemetry removed, CCMD notified.  Daymon Larsen, RN

## 2022-05-28 NOTE — TOC Progression Note (Signed)
Transition of Care [facility]) - Progression Note    Patient Details  Name: Courtney Gray MRN: [id number] Date of Birth: 04-28-1970  Transition of Care [facility]) CM/SW [place], [place] Phone Number: 05/28/2022, 8:07 AM  Clinical Narrative:     Received insurance [place] # [id number]  Reference # [id number] 2/21-2/23.  Thurmond Butts, MSW, LCSW Clinical Social Worker    Expected Discharge Plan: Skilled Nursing Facility Barriers to Discharge: Barriers Resolved  Expected Discharge Plan and Services In-house Referral: Clinical Social Work   Post Acute Care Choice: [place] Living arrangements for the past 2 months: [place] Expected Discharge Date: 05/27/22                                     Social Determinants of Health (SDOH) Interventions SDOH Screenings   Food Insecurity: No Food Insecurity (12/11/2021)  Housing: Low Risk  (12/11/2021)  Transportation Needs: No Transportation Needs (12/11/2021)  Utilities: At Risk (12/11/2021)  Tobacco Use: Low Risk  (05/25/2022)    Readmission Risk Interventions    09/16/2021    4:36 PM 08/28/2021   11:50 AM 07/12/2021   12:38 PM  Readmission Risk Prevention Plan  Transportation Screening Complete Complete Complete  Medication Review Press photographer)  Complete Complete  PCP or Specialist appointment within 3-5 days of discharge  Complete Complete  HRI or Home Care Consult Complete Complete Complete  SW Recovery Care/Counseling Consult Complete Complete Complete  Palliative Care Screening  Not Applicable Not Applicable  Skilled Nursing Facility Complete Not Applicable Complete

## 2022-05-28 NOTE — Progress Notes (Signed)
Patient seen and examined.  She is currently receiving hemodialysis.  She had a lot of disturbance at night and some headache.  No more bleeding episodes.  Remains overall stable.  She does have multiple medical issues and multiple complaints but mostly chronic.  Patient is medically stable.  She could not be discharged yesterday as insurance authorization was unable to be secured by late night.  Plan: Discharge summary, medication reconciliation reviewed.  Does not need any changes.  Stable for discharge.   Total time spent: 20 minutes.  No charge visit.

## 2022-05-29 LAB — TYPE AND SCREEN
ABO/RH(D): O POS
Antibody Screen: NEGATIVE
Unit division: 0
Unit division: 0
Unit division: 0
Unit division: 0
Unit division: 0
Unit division: 0
Unit division: 0
Unit division: 0
Unit division: 0
Unit division: 0
Unit division: 0
Unit division: 0
Unit division: 0

## 2022-05-29 LAB — BPAM RBC
Blood Product Expiration Date: 202403192359
Blood Product Expiration Date: 202403222359
Blood Product Expiration Date: 202403242359
Blood Product Expiration Date: 202403242359
Blood Product Expiration Date: 202403252359
Blood Product Expiration Date: 202403252359
Blood Product Expiration Date: 202403252359
Blood Product Expiration Date: 202403252359
Blood Product Expiration Date: 202403252359
Blood Product Expiration Date: 202403252359
Blood Product Expiration Date: 202403252359
Blood Product Expiration Date: 202403252359
Blood Product Expiration Date: 202403252359
ISSUE DATE / TIME: 202402181644
ISSUE DATE / TIME: 202402181653
ISSUE DATE / TIME: 202402181711
ISSUE DATE / TIME: 202402181711
ISSUE DATE / TIME: 202402181714
ISSUE DATE / TIME: 202402190801
ISSUE DATE / TIME: 202402190801
ISSUE DATE / TIME: 202402191059
ISSUE DATE / TIME: 202402191609
ISSUE DATE / TIME: 202402200949
ISSUE DATE / TIME: 202402200949
ISSUE DATE / TIME: 202402201148
ISSUE DATE / TIME: 202402201148
Unit Type and Rh: 5100
Unit Type and Rh: 5100
Unit Type and Rh: 5100
Unit Type and Rh: 5100
Unit Type and Rh: 5100
Unit Type and Rh: 5100
Unit Type and Rh: 5100
Unit Type and Rh: 5100
Unit Type and Rh: 5100
Unit Type and Rh: 5100
Unit Type and Rh: 5100
Unit Type and Rh: 5100
Unit Type and Rh: 5100

## 2022-07-30 ENCOUNTER — Encounter: Payer: Self-pay | Admitting: Nurse Practitioner

## 2022-09-08 ENCOUNTER — Encounter (HOSPITAL_COMMUNITY): Payer: Self-pay

## 2022-09-08 ENCOUNTER — Observation Stay (HOSPITAL_COMMUNITY)
Admission: EM | Admit: 2022-09-08 | Discharge: 2022-09-09 | Disposition: A | Discharge status: Skilled Nursing Facility | Payer: Medicare Other | Attending: Internal Medicine | Admitting: Internal Medicine

## 2022-09-08 ENCOUNTER — Emergency Department (HOSPITAL_COMMUNITY): Payer: Medicare Other

## 2022-09-08 DIAGNOSIS — Z79899 Other long term (current) drug therapy: Secondary | ICD-10-CM | POA: Diagnosis not present

## 2022-09-08 DIAGNOSIS — R0602 Shortness of breath: Secondary | ICD-10-CM | POA: Diagnosis not present

## 2022-09-08 DIAGNOSIS — M546 Pain in thoracic spine: Secondary | ICD-10-CM

## 2022-09-08 DIAGNOSIS — Z7982 Long term (current) use of aspirin: Secondary | ICD-10-CM | POA: Diagnosis not present

## 2022-09-08 DIAGNOSIS — I132 Hypertensive heart and chronic kidney disease with heart failure and with stage 5 chronic kidney disease, or end stage renal disease: Secondary | ICD-10-CM | POA: Diagnosis not present

## 2022-09-08 DIAGNOSIS — E039 Hypothyroidism, unspecified: Secondary | ICD-10-CM

## 2022-09-08 DIAGNOSIS — R7989 Other specified abnormal findings of blood chemistry: Secondary | ICD-10-CM | POA: Diagnosis not present

## 2022-09-08 DIAGNOSIS — E785 Hyperlipidemia, unspecified: Secondary | ICD-10-CM | POA: Diagnosis present

## 2022-09-08 DIAGNOSIS — I4581 Long QT syndrome: Secondary | ICD-10-CM | POA: Diagnosis not present

## 2022-09-08 DIAGNOSIS — Z992 Dependence on renal dialysis: Secondary | ICD-10-CM | POA: Diagnosis not present

## 2022-09-08 DIAGNOSIS — N189 Chronic kidney disease, unspecified: Secondary | ICD-10-CM

## 2022-09-08 DIAGNOSIS — I5032 Chronic diastolic (congestive) heart failure: Secondary | ICD-10-CM | POA: Diagnosis not present

## 2022-09-08 DIAGNOSIS — N186 End stage renal disease: Secondary | ICD-10-CM

## 2022-09-08 DIAGNOSIS — E119 Type 2 diabetes mellitus without complications: Secondary | ICD-10-CM

## 2022-09-08 DIAGNOSIS — E1122 Type 2 diabetes mellitus with diabetic chronic kidney disease: Secondary | ICD-10-CM | POA: Diagnosis not present

## 2022-09-08 DIAGNOSIS — E782 Mixed hyperlipidemia: Secondary | ICD-10-CM

## 2022-09-08 DIAGNOSIS — Z862 Personal history of diseases of the blood and blood-forming organs and certain disorders involving the immune mechanism: Secondary | ICD-10-CM

## 2022-09-08 DIAGNOSIS — K219 Gastro-esophageal reflux disease without esophagitis: Secondary | ICD-10-CM

## 2022-09-08 DIAGNOSIS — R9431 Abnormal electrocardiogram [ECG] [EKG]: Secondary | ICD-10-CM

## 2022-09-08 LAB — CBC
HCT: 34.4 % — ABNORMAL LOW (ref 36.0–46.0)
Hemoglobin: 10.6 g/dL — ABNORMAL LOW (ref 12.0–15.0)
MCH: 28.3 pg (ref 26.0–34.0)
MCHC: 30.8 g/dL (ref 30.0–36.0)
MCV: 92 fL (ref 80.0–100.0)
Platelets: 137 10*3/uL — ABNORMAL LOW (ref 150–400)
RBC: 3.74 MIL/uL — ABNORMAL LOW (ref 3.87–5.11)
RDW: 18.6 % — ABNORMAL HIGH (ref 11.5–15.5)
WBC: 8.8 10*3/uL (ref 4.0–10.5)
nRBC: 0 % (ref 0.0–0.2)

## 2022-09-08 LAB — COMPREHENSIVE METABOLIC PANEL
ALT: 16 U/L (ref 0–44)
AST: 15 U/L (ref 15–41)
Albumin: 3.4 g/dL — ABNORMAL LOW (ref 3.5–5.0)
Alkaline Phosphatase: 70 U/L (ref 38–126)
Anion gap: 17 — ABNORMAL HIGH (ref 5–15)
BUN: 45 mg/dL — ABNORMAL HIGH (ref 6–20)
CO2: 24 mmol/L (ref 22–32)
Calcium: 9.3 mg/dL (ref 8.9–10.3)
Chloride: 96 mmol/L — ABNORMAL LOW (ref 98–111)
Creatinine, Ser: 6.04 mg/dL — ABNORMAL HIGH (ref 0.44–1.00)
GFR, Estimated: 8 mL/min — ABNORMAL LOW (ref >60)
Glucose, Bld: 96 mg/dL (ref 70–99)
Potassium: 4.7 mmol/L (ref 3.5–5.1)
Sodium: 137 mmol/L (ref 135–145)
Total Bilirubin: 0.7 mg/dL (ref 0.3–1.2)
Total Protein: 8 g/dL (ref 6.5–8.1)

## 2022-09-08 LAB — D-DIMER, QUANTITATIVE: D-Dimer, Quant: 2.43 ug/mL-FEU — ABNORMAL HIGH (ref 0.00–0.50)

## 2022-09-08 MED ORDER — ACETAMINOPHEN 325 MG PO TABS
650.0000 mg | ORAL_TABLET | Freq: Once | ORAL | Status: AC
  Administered 2022-09-08: 650 mg via ORAL
  Filled 2022-09-08: qty 2

## 2022-09-08 MED ORDER — LIDOCAINE 5 % EX PTCH
1.0000 | MEDICATED_PATCH | CUTANEOUS | Status: DC
  Administered 2022-09-08: 1 via TRANSDERMAL
  Filled 2022-09-08: qty 1

## 2022-09-08 MED ORDER — ONDANSETRON HCL 4 MG/2ML IJ SOLN: 4.0000 mg | Freq: Four times a day (QID) | INTRAMUSCULAR | Status: DC | PRN

## 2022-09-08 MED ORDER — ACETAMINOPHEN 325 MG PO TABS: 650.0000 mg | ORAL_TABLET | Freq: Four times a day (QID) | ORAL | Status: DC | PRN

## 2022-09-08 MED ORDER — MELATONIN 3 MG PO TABS: 3.0000 mg | ORAL_TABLET | Freq: Every evening | ORAL | Status: DC | PRN

## 2022-09-08 MED ORDER — METHOCARBAMOL 500 MG PO TABS
1000.0000 mg | ORAL_TABLET | Freq: Once | ORAL | Status: AC
  Administered 2022-09-08: 1000 mg via ORAL
  Filled 2022-09-08: qty 2

## 2022-09-08 MED ORDER — HYDROMORPHONE HCL 1 MG/ML IJ SOLN
1.0000 mg | Freq: Once | INTRAMUSCULAR | Status: AC
  Administered 2022-09-08: 1 mg via INTRAVENOUS
  Filled 2022-09-08: qty 1

## 2022-09-08 MED ORDER — ACETAMINOPHEN 650 MG RE SUPP: 650.0000 mg | Freq: Four times a day (QID) | RECTAL | Status: DC | PRN

## 2022-09-08 NOTE — H&P (Signed)
History and Physical      Courtney Gray [id number] DOB: [birth year] DOA: 09/08/2022; DOS: 09/08/2022  PCP: Kirstie Peri, MD *** Patient coming from: home ***  I have personally briefly reviewed patient's old medical records in [facility] Health Link  Chief Complaint: ***  HPI: Courtney Gray is a 52 y.o. female with medical history significant for *** who is admitted to [facility] on 09/08/2022 with *** after presenting from home*** to [facility] ED complaining of ***.    ***       ***   ED Course:  Vital signs in the ED were notable for the following: ***  Labs were notable for the following: ***  Per my interpretation, EKG in ED demonstrated the following:  ***  Imaging and additional notable ED work-up: ***  While in the ED, the following were administered: ***  Subsequently, the patient was admitted  ***  ***red    Review of Systems: As per HPI otherwise 10 point review of systems negative.   Past Medical History:  Diagnosis Date   Arthritis    Bacteremia    Diabetes mellitus without complication (HCC)    Dialysis complication, subsequent encounter    ESRD (end stage renal disease) (HCC)    Gout    High cholesterol    Hypertension    Hypertensive emergency    Ischemic foot pain at rest 08/03/2021   Renal disorder     Past Surgical History:  Procedure Laterality Date   ABDOMINAL AORTOGRAM W/LOWER EXTREMITY N/A 08/07/2021   Procedure: ABDOMINAL AORTOGRAM W/LOWER EXTREMITY;  Surgeon: Victorino Sparrow, MD;  Location: [facility] INVASIVE CV LAB;  Service: Cardiovascular;  Laterality: N/A;   AMPUTATION Right 08/14/2021   Procedure: AMPUTATION ABOVE KNEE;  Surgeon: Cephus Shelling, MD;  Location: [facility] OR;  Service: Vascular;  Laterality: Right;   AMPUTATION Left 09/23/2021   Procedure: AMPUTATION MIDDLE FINGER;  Surgeon: Marlyne Beards, MD;  Location: [facility] OR;  Service: Orthopedics;  Laterality: Left;   AMPUTATION Left 10/09/2021   Procedure: LEFT ABOVE KNEE  AMPUTATION;  Surgeon: Leonie Douglas, MD;  Location[facility] OR;  Service: Vascular;  Laterality: Left;   AV FISTULA PLACEMENT     x2, unsuccessful.    BUBBLE STUDY  09/27/2021   Procedure: BUBBLE STUDY;  Surgeon: Lewayne Bunting, MD;  Location: [facility] ENDOSCOPY;  Service: Cardiovascular;;   IR FLUORO GUIDE CV LINE LEFT  12/14/2020   IR FLUORO GUIDE CV LINE LEFT  02/20/2021   IR FLUORO GUIDE CV LINE LEFT  09/11/2021   IR FLUORO GUIDE CV LINE LEFT  10/18/2021   IR FLUORO GUIDE CV LINE LEFT  11/26/2021   IR FLUORO GUIDE CV LINE LEFT  11/28/2021   IR FLUORO GUIDE CV LINE LEFT  02/04/2022   IR FLUORO GUIDE CV LINE LEFT  03/07/2022   IR FLUORO GUIDE CV LINE RIGHT  08/28/2020   IR FLUORO GUIDE CV LINE RIGHT  05/13/2021   IR FLUORO GUIDE CV LINE RIGHT  05/24/2021   IR INTRAVASCULAR ULTRASOUND NON CORONARY  03/07/2022   IR PERC TUN PERIT CATH WO PORT S&I /IMAG  09/06/2020   IR PTA VENOUS EXCEPT DIALYSIS CIRCUIT  02/20/2021   IR PTA VENOUS EXCEPT DIALYSIS CIRCUIT  05/13/2021   IR PTA VENOUS EXCEPT DIALYSIS CIRCUIT  11/28/2021   IR PTA VENOUS EXCEPT DIALYSIS CIRCUIT  03/07/2022   IR REMOVAL TUN CV CATH W/O FL  05/24/2021   IR REMOVAL TUN CV CATH W/O FL  10/15/2021  IR US GUIDE VASC ACCESS LEFT  09/06/2020   IR US GUIDE VASC ACCESS LEFT  09/11/2021   IR US GUIDE VASC ACCESS LEFT  10/18/2021   IR US GUIDE VASC ACCESS RIGHT  05/24/2021   IR US GUIDE VASC ACCESS RIGHT  09/10/2021   IR VENO/JUGULAR RIGHT  09/10/2021   IR VENOCAVAGRAM SVC  02/20/2021   IR VENOCAVAGRAM SVC  11/26/2021   IR VENOCAVAGRAM SVC  03/07/2022   TEE WITHOUT CARDIOVERSION N/A 09/27/2021   Procedure: TRANSESOPHAGEAL ECHOCARDIOGRAM (TEE);  Surgeon: Lewayne Bunting, MD;  Location: [facility] ENDOSCOPY;  Service: Cardiovascular;  Laterality: N/A;   UPPER EXTREMITY VENOGRAPHY Right 12/04/2021   Procedure: UPPER EXTREMITY VENOGRAPHY;  Surgeon: Victorino Sparrow, MD;  Location: [facility] INVASIVE CV LAB;  Service: Cardiovascular;  Laterality: Right;    Social History:  reports  that she has never smoked. She has never used smokeless tobacco. She reports that she does not drink alcohol and does not use drugs.   Allergies  Allergen Reactions   Benzonatate Other (See Comments)    Hallucinations   Hydralazine Other (See Comments)    Hallucinations   Amoxicillin Itching and Nausea And Vomiting   Clonidine Other (See Comments)    Altered mental state, insomnia    Chlorhexidine Itching    Patient reports it is the CHG baths that cause her itching not the CHG used for caring for her IV/HD access.     Morphine Itching   Oxycodone Itching    Pt tolerated hospital admission 07/2021    Family History  Problem Relation Age of Onset   Diabetes Mother    Diabetes Father     Family history reviewed and not pertinent ***   Prior to Admission medications   Medication Sig Start Date End Date Taking? Authorizing Provider  acetaminophen (TYLENOL) 500 MG tablet Take 1,000 mg by mouth as needed for moderate pain.    [provider]  aspirin EC 81 MG tablet Take 1 tablet (81 mg total) by mouth daily. Swallow whole. 05/16/22   [place], Eric J, DO  atorvastatin (LIPITOR) 10 MG tablet Take 1 tablet (10 mg total) by mouth daily. Patient taking differently: Take 10 mg by mouth at bedtime. 11/28/20   Shon Hale, MD  Darbepoetin Alfa (ARANESP) 200 MCG/0.4ML SOSY injection Inject 0.4 mLs (200 mcg total) into the skin every Tuesday at 6 PM. 05/20/22   [place], Alvira Philips, DO  docusate sodium (COLACE) 100 MG capsule Take 100 mg by mouth 2 (two) times daily.    [provider]  famotidine (PEPCID) 20 MG tablet Take 1 tablet (20 mg total) by mouth daily. 05/16/22   [place], Alvira Philips, DO  gabapentin (NEURONTIN) 100 MG capsule Take 1 capsule (100 mg total) by mouth at bedtime. 05/15/22   [place], Eric J, DO  lactulose (CHRONULAC) 10 GM/15ML solution Take 45 mLs (30 g total) by mouth 2 (two) times daily as needed for mild constipation. 05/15/22   [place], Alvira Philips, DO  levothyroxine  (SYNTHROID) 100 MCG tablet Take 100 mcg by mouth daily.    [provider]  loperamide (IMODIUM A-D) 2 MG tablet Take 4 mg by mouth as needed for diarrhea or loose stools.    [provider]  loratadine (CLARITIN) 10 MG tablet Take 1 tablet (10 mg total) by mouth daily as needed for allergies. 05/15/22   [place], Alvira Philips, DO  midodrine (PROAMATINE) 10 MG tablet Take 1 tablet (10 mg total) by mouth every Monday, Wednesday,  and Friday at 8 PM. Before hemodialysis 05/16/22   [place], Alvira Philips, DO  nitroGLYCERIN (NITROSTAT) 0.4 MG SL tablet Place 1 tablet (0.4 mg total) under the tongue every 5 (five) minutes as needed for chest pain. 05/24/21   [place], Alvira Philips, DO  OXYGEN Inhale 4 L into the lungs See admin instructions. Oxygen at 4L hr via nasal cannula and will use portable oxygen machine when up in wheelchair. 10/28/21   [provider]  pantoprazole (PROTONIX) 40 MG tablet Take 1 tablet (40 mg total) by mouth daily. 11/29/20   Shon Hale, MD  polyethylene glycol (MIRALAX / GLYCOLAX) 17 g packet Take 17 g by mouth daily as needed for mild constipation. 05/15/22   [place], Alvira Philips, DO  polyvinyl alcohol (LIQUIFILM TEARS) 1.4 % ophthalmic solution Place 1 drop into both eyes as needed for dry eyes. 05/15/22   [place], Alvira Philips, DO  pregabalin (LYRICA) 50 MG capsule Take 1 capsule (50 mg total) by mouth daily for 5 days. 05/27/22 06/01/22  Dorcas Carrow, MD  sevelamer carbonate (RENVELA) 2.4 g PACK Take 4.8 g by mouth 3 (three) times daily with meals. 05/15/22   [place], Alvira Philips, DO  simethicone (MYLICON) 80 MG chewable tablet Chew 80 mg by mouth every 6 (six) hours as needed for flatulence.    [provider]  sodium chloride 0.9 % SOLN 100 mL with sodium thiosulfate 250 MG/ML SOLN 12.5 g Inject 12.5 g into the vein every Monday, Wednesday, and Friday with hemodialysis. 05/16/22   [place], Eric J, DO     Objective    Physical Exam: Vitals:   09/08/22 1937  BP: (!) 145/94   Pulse: 85  Resp: 20  Temp: (!) 97.5 F (36.4 C)  SpO2: 99%    General: appears to be stated age; alert, oriented Skin: warm, dry, no rash Head:  AT/[place] Mouth:  Oral mucosa membranes appear moist, normal dentition Neck: supple; trachea midline Heart:  RRR; did not appreciate any M/R/G Lungs: CTAB, did not appreciate any wheezes, rales, or rhonchi Abdomen: + BS; soft, ND, NT Vascular: 2+ pedal pulses b/l; 2+ radial pulses b/l Extremities: no peripheral edema, no muscle wasting Neuro: strength and sensation intact in upper and lower extremities b/l ***   *** Neuro: 5/5 strength of the proximal and distal flexors and extensors of the upper and lower extremities bilaterally; sensation intact in upper and lower extremities b/l; cranial nerves II through XII grossly intact; no pronator drift; no evidence suggestive of slurred speech, dysarthria, or facial droop; Normal muscle tone. No tremors.  *** Neuro: In the setting of the patient's current mental status and associated inability to follow instructions, unable to perform full neurologic exam at this time.  As such, assessment of strength, sensation, and cranial nerves is limited at this time. Patient noted to spontaneously move all 4 extremities. No tremors.  ***    Labs on Admission: I have personally reviewed following labs and imaging studies  CBC: Recent Labs  Lab 09/08/22 2043  WBC 8.8  HGB 10.6*  HCT 34.4*  MCV 92.0  PLT 137*   Basic Metabolic Panel: Recent Labs  Lab 09/08/22 2043  NA 137  K 4.7  CL 96*  CO2 24  GLUCOSE 96  BUN 45*  CREATININE 6.04*  CALCIUM 9.3   GFR: CrCl cannot be calculated (Unknown ideal weight.). Liver Function Tests: Recent Labs  Lab 09/08/22 2043  AST 15  ALT 16  ALKPHOS 70  BILITOT 0.7  PROT  8.0  ALBUMIN 3.4*   No results for input(s): "LIPASE", "AMYLASE" in the last 168 hours. No results for input(s): "AMMONIA" in the last 168 hours. Coagulation Profile: No results  for input(s): "INR", "PROTIME" in the last 168 hours. Cardiac Enzymes: No results for input(s): "CKTOTAL", "CKMB", "CKMBINDEX", "TROPONINI" in the last 168 hours. BNP (last 3 results) No results for input(s): "PROBNP" in the last 8760 hours. HbA1C: No results for input(s): "HGBA1C" in the last 72 hours. CBG: No results for input(s): "GLUCAP" in the last 168 hours. Lipid Profile: No results for input(s): "CHOL", "HDL", "LDLCALC", "TRIG", "CHOLHDL", "LDLDIRECT" in the last 72 hours. Thyroid Function Tests: No results for input(s): "TSH", "T4TOTAL", "FREET4", "T3FREE", "THYROIDAB" in the last 72 hours. Anemia Panel: No results for input(s): "VITAMINB12", "FOLATE", "FERRITIN", "TIBC", "IRON", "RETICCTPCT" in the last 72 hours. Urine analysis: No results found for: "COLORURINE", "APPEARANCEUR", "LABSPEC", "PHURINE", "GLUCOSEU", "HGBUR", "BILIRUBINUR", "KETONESUR", "PROTEINUR", "UROBILINOGEN", "NITRITE", "LEUKOCYTESUR"  Radiological Exams on Admission: DG Chest Port 1 View  Result Date: 09/08/2022 CLINICAL DATA:  Shortness of breath. EXAM: PORTABLE CHEST 1 VIEW COMPARISON:  December 10, 2021 FINDINGS: There is stable left-sided venous catheter positioning. The cardiac silhouette is moderately enlarged and unchanged in size. There is marked severity calcification of the aortic arch. Mild suprahilar and infrahilar prominence of the pulmonary vasculature is noted. Mild, diffuse, chronic appearing increased interstitial lung markings are noted. There is no evidence of focal consolidation, pleural effusion or pneumothorax. The visualized skeletal structures are unremarkable. IMPRESSION: 1. Cardiomegaly with mild pulmonary vascular congestion. 2. Chronic appearing increased interstitial lung markings. Electronically Signed   By: Aram Candela M.D.   On: 09/08/2022 21:39      Assessment/Plan   Principal Problem:   SOB (shortness of  breath)   ***       ***            ***             ***            ***            ***            ***             ***           ***           ***           ***           ***          ***          ***  DVT prophylaxis: SCD's ***  Code Status: Full code*** Family Communication: none*** Disposition Plan: Per Rounding Team Consults called: none***;  Admission status: ***     I SPENT GREATER THAN 75 *** MINUTES IN CLINICAL CARE TIME/MEDICAL DECISION-MAKING IN COMPLETING THIS ADMISSION.      Chaney Born Brandey Vandalen DO [facility]  From 7PM - 7AM   09/08/2022, 10:48 PM   ***

## 2022-09-08 NOTE — ED Provider Notes (Signed)
[place] EMERGENCY DEPARTMENT AT [facility] Provider Note   CSN: [id number] Arrival date & time: 09/08/22  1934     History  Chief Complaint  Patient presents with   Back Pain   Shortness of Breath    Courtney Gray is a 52 y.o. female with a past medical history of ESRD on dialysis Monday Wednesday Friday, diabetes, gout, hypercholesterol, hypertension present today for evaluation of back pain and shortness of breath.  Patient reports acute onset of back pain during physical therapy today.  Patient states that she was lifting 20 lb weights on each arm when she had pain in the middle of her back.  She reports that the physical therapist tried to massage the back, but no relief.  She also reports associated shortness of breath.  Denies any chest pain.  Patient is on 2 L O2 at baseline.  Denies any cough, nasal congestion, fever, nausea or vomiting.   Back Pain Shortness of Breath     Past Medical History:  Diagnosis Date   Arthritis    Bacteremia    Diabetes mellitus without complication (HCC)    Dialysis complication, subsequent encounter    ESRD (end stage renal disease) (HCC)    Gout    High cholesterol    Hypertension    Hypertensive emergency    Ischemic foot pain at rest 08/03/2021   Renal disorder    Past Surgical History:  Procedure Laterality Date   ABDOMINAL AORTOGRAM W/LOWER EXTREMITY N/A 08/07/2021   Procedure: ABDOMINAL AORTOGRAM W/LOWER EXTREMITY;  Surgeon: Victorino Sparrow, MD;  Location: [facility] INVASIVE CV LAB;  Service: Cardiovascular;  Laterality: N/A;   AMPUTATION Right 08/14/2021   Procedure: AMPUTATION ABOVE KNEE;  Surgeon: Cephus Shelling, MD;  Location: [facility] OR;  Service: Vascular;  Laterality: Right;   AMPUTATION Left 09/23/2021   Procedure: AMPUTATION MIDDLE FINGER;  Surgeon: Marlyne Beards, MD;  Location: [facility] OR;  Service: Orthopedics;  Laterality: Left;   AMPUTATION Left 10/09/2021   Procedure: LEFT ABOVE KNEE AMPUTATION;  Surgeon:  Leonie Douglas, MD;  Location: [facility] OR;  Service: Vascular;  Laterality: Left;   AV FISTULA PLACEMENT     x2, unsuccessful.    BUBBLE STUDY  09/27/2021   Procedure: BUBBLE STUDY;  Surgeon: Lewayne Bunting, MD;  Location: [facility] ENDOSCOPY;  Service: Cardiovascular;;   IR FLUORO GUIDE CV LINE LEFT  12/14/2020   IR FLUORO GUIDE CV LINE LEFT  02/20/2021   IR FLUORO GUIDE CV LINE LEFT  09/11/2021   IR FLUORO GUIDE CV LINE LEFT  10/18/2021   IR FLUORO GUIDE CV LINE LEFT  11/26/2021   IR FLUORO GUIDE CV LINE LEFT  11/28/2021   IR FLUORO GUIDE CV LINE LEFT  02/04/2022   IR FLUORO GUIDE CV LINE LEFT  03/07/2022   IR FLUORO GUIDE CV LINE RIGHT  08/28/2020   IR FLUORO GUIDE CV LINE RIGHT  05/13/2021   IR FLUORO GUIDE CV LINE RIGHT  05/24/2021   IR INTRAVASCULAR ULTRASOUND NON CORONARY  03/07/2022   IR PERC TUN PERIT CATH WO PORT S&I /IMAG  09/06/2020   IR PTA VENOUS EXCEPT DIALYSIS CIRCUIT  02/20/2021   IR PTA VENOUS EXCEPT DIALYSIS CIRCUIT  05/13/2021   IR PTA VENOUS EXCEPT DIALYSIS CIRCUIT  11/28/2021   IR PTA VENOUS EXCEPT DIALYSIS CIRCUIT  03/07/2022   IR REMOVAL TUN CV CATH W/O FL  05/24/2021   IR REMOVAL TUN CV CATH W/O FL  10/15/2021   IR US GUIDE  VASC ACCESS LEFT  09/06/2020   IR US GUIDE VASC ACCESS LEFT  09/11/2021   IR US GUIDE VASC ACCESS LEFT  10/18/2021   IR US GUIDE VASC ACCESS RIGHT  05/24/2021   IR US GUIDE VASC ACCESS RIGHT  09/10/2021   IR VENO/JUGULAR RIGHT  09/10/2021   IR VENOCAVAGRAM SVC  02/20/2021   IR VENOCAVAGRAM SVC  11/26/2021   IR VENOCAVAGRAM SVC  03/07/2022   TEE WITHOUT CARDIOVERSION N/A 09/27/2021   Procedure: TRANSESOPHAGEAL ECHOCARDIOGRAM (TEE);  Surgeon: Lewayne Bunting, MD;  Location: [facility] ENDOSCOPY;  Service: Cardiovascular;  Laterality: N/A;   UPPER EXTREMITY VENOGRAPHY Right 12/04/2021   Procedure: UPPER EXTREMITY VENOGRAPHY;  Surgeon: Victorino Sparrow, MD;  Location: [facility] INVASIVE CV LAB;  Service: Cardiovascular;  Laterality: Right;     Home Medications Prior to Admission medications    Medication Sig Start Date End Date Taking? Authorizing Provider  acetaminophen (TYLENOL) 500 MG tablet Take 1,000 mg by mouth as needed for moderate pain.    [provider]  aspirin EC 81 MG tablet Take 1 tablet (81 mg total) by mouth daily. Swallow whole. 05/16/22   [place], Eric J, DO  atorvastatin (LIPITOR) 10 MG tablet Take 1 tablet (10 mg total) by mouth daily. Patient taking differently: Take 10 mg by mouth at bedtime. 11/28/20   Shon Hale, MD  Darbepoetin Alfa (ARANESP) 200 MCG/0.4ML SOSY injection Inject 0.4 mLs (200 mcg total) into the skin every Tuesday at 6 PM. 05/20/22   [place], Alvira Philips, DO  docusate sodium (COLACE) 100 MG capsule Take 100 mg by mouth 2 (two) times daily.    [provider]  famotidine (PEPCID) 20 MG tablet Take 1 tablet (20 mg total) by mouth daily. 05/16/22   [place], Alvira Philips, DO  gabapentin (NEURONTIN) 100 MG capsule Take 1 capsule (100 mg total) by mouth at bedtime. 05/15/22   [place], Eric J, DO  lactulose (CHRONULAC) 10 GM/15ML solution Take 45 mLs (30 g total) by mouth 2 (two) times daily as needed for mild constipation. 05/15/22   [place], Alvira Philips, DO  levothyroxine (SYNTHROID) 100 MCG tablet Take 100 mcg by mouth daily.    [provider]  loperamide (IMODIUM A-D) 2 MG tablet Take 4 mg by mouth as needed for diarrhea or loose stools.    [provider]  loratadine (CLARITIN) 10 MG tablet Take 1 tablet (10 mg total) by mouth daily as needed for allergies. 05/15/22   [place], Alvira Philips, DO  midodrine (PROAMATINE) 10 MG tablet Take 1 tablet (10 mg total) by mouth every Monday, Wednesday, and Friday at 8 PM. Before hemodialysis 05/16/22   [place], Eric J, DO  nitroGLYCERIN (NITROSTAT) 0.4 MG SL tablet Place 1 tablet (0.4 mg total) under the tongue every 5 (five) minutes as needed for chest pain. 05/24/21   [place], Alvira Philips, DO  OXYGEN Inhale 4 L into the lungs See admin instructions. Oxygen at 4L hr via nasal cannula and will use portable  oxygen machine when up in wheelchair. 10/28/21   [provider]  pantoprazole (PROTONIX) 40 MG tablet Take 1 tablet (40 mg total) by mouth daily. 11/29/20   Shon Hale, MD  polyethylene glycol (MIRALAX / GLYCOLAX) 17 g packet Take 17 g by mouth daily as needed for mild constipation. 05/15/22   [place], Alvira Philips, DO  polyvinyl alcohol (LIQUIFILM TEARS) 1.4 % ophthalmic solution Place 1 drop into both eyes as needed for dry eyes. 05/15/22   [place], Eric J, DO  pregabalin (LYRICA) 50 MG capsule Take 1 capsule (50 mg total) by mouth daily for 5 days. 05/27/22 06/01/22  Dorcas Carrow, MD  sevelamer carbonate (RENVELA) 2.4 g PACK Take 4.8 g by mouth 3 (three) times daily with meals. 05/15/22   [place], Alvira Philips, DO  simethicone (MYLICON) 80 MG chewable tablet Chew 80 mg by mouth every 6 (six) hours as needed for flatulence.    [provider]  sodium chloride 0.9 % SOLN 100 mL with sodium thiosulfate 250 MG/ML SOLN 12.5 g Inject 12.5 g into the vein every Monday, Wednesday, and Friday with hemodialysis. 05/16/22   [place], Alvira Philips, DO      Allergies    Benzonatate, Hydralazine, Amoxicillin, Clonidine, Chlorhexidine, Morphine, and Oxycodone    Review of Systems   Review of Systems  Respiratory:  Positive for shortness of breath.   Musculoskeletal:  Positive for back pain.    Physical Exam Updated Vital Signs BP (!) 145/94   Pulse 85   Temp (!) 97.5 F (36.4 C)   Resp 20   SpO2 99%  Physical Exam Vitals and nursing note reviewed.  Constitutional:      Appearance: Normal appearance. She is obese.     Comments: Bilateral amputee.  HENT:     Head: Normocephalic and atraumatic.     Mouth/Throat:     Mouth: Mucous membranes are moist.  Eyes:     General: No scleral icterus. Cardiovascular:     Rate and Rhythm: Normal rate and regular rhythm.     Pulses: Normal pulses.     Heart sounds: Normal heart sounds.  Pulmonary:     Effort: Pulmonary effort is normal.     Breath sounds:  Normal breath sounds.  Abdominal:     General: Abdomen is flat.     Palpations: Abdomen is soft.     Tenderness: There is no abdominal tenderness.  Musculoskeletal:        General: No deformity.     Comments: Ttp to left paraspinal muscles of thoracic spine.  Skin:    General: Skin is warm.     Findings: No rash.  Neurological:     General: No focal deficit present.     Mental Status: She is alert.  Psychiatric:        Mood and Affect: Mood normal.     ED Results / Procedures / Treatments   Labs (all labs ordered are listed, but only abnormal results are displayed) Labs Reviewed  COMPREHENSIVE METABOLIC PANEL - Abnormal; Notable for the following components:      Result Value   Chloride 96 (*)    BUN 45 (*)    Creatinine, Ser 6.04 (*)    Albumin 3.4 (*)    GFR, Estimated 8 (*)    Anion gap 17 (*)    All other components within normal limits  CBC - Abnormal; Notable for the following components:   RBC 3.74 (*)    Hemoglobin 10.6 (*)    HCT 34.4 (*)    RDW 18.6 (*)    Platelets 137 (*)    All other components within normal limits  D-DIMER, QUANTITATIVE - Abnormal; Notable for the following components:   D-Dimer, Quant 2.43 (*)    All other components within normal limits    EKG None  Radiology DG Chest Port 1 View  Result Date: 09/08/2022 CLINICAL DATA:  Shortness of breath. EXAM: PORTABLE CHEST 1 VIEW COMPARISON:  December 10, 2021 FINDINGS: There is stable left-sided  venous catheter positioning. The cardiac silhouette is moderately enlarged and unchanged in size. There is marked severity calcification of the aortic arch. Mild suprahilar and infrahilar prominence of the pulmonary vasculature is noted. Mild, diffuse, chronic appearing increased interstitial lung markings are noted. There is no evidence of focal consolidation, pleural effusion or pneumothorax. The visualized skeletal structures are unremarkable. IMPRESSION: 1. Cardiomegaly with mild pulmonary vascular  congestion. 2. Chronic appearing increased interstitial lung markings. Electronically Signed   By: Aram Candela M.D.   On: 09/08/2022 21:39    Procedures Procedures    Medications Ordered in ED Medications  lidocaine (LIDODERM) 5 % 1 patch (1 patch Transdermal Patch Applied 09/08/22 2027)  HYDROmorphone (DILAUDID) injection 1 mg (1 mg Intravenous Given 09/08/22 2027)  methocarbamol (ROBAXIN) tablet 1,000 mg (1,000 mg Oral Given 09/08/22 2027)  acetaminophen (TYLENOL) tablet 650 mg (650 mg Oral Given 09/08/22 2027)    ED Course/ Medical Decision Making/ A&P                             Medical Decision Making Amount and/or Complexity of Data Reviewed Labs: ordered. Radiology: ordered.  Risk OTC drugs. Prescription drug management. Decision regarding hospitalization.   This patient presents to the ED for back pain, shortness of breath, this involves an extensive number of treatment options, and is a complaint that carries with a high risk of complications and morbidity.  The differential diagnosis includes musculoskeletal pain, neuropathy, PE, pneumothorax, pneumonia, ACS/MI, kidney stone, cauda equina.  This is not an exhaustive list.  Lab tests: I ordered and personally interpreted labs.  The pertinent results include: WBC unremarkable. Hbg 10.4 baseline 8-9. Platelets unremarkable. . BUN, creatinine at baseline.  D-dimer 2.43  Imaging studies: I ordered imaging studies. I personally reviewed, interpreted imaging and agree with the radiologist's interpretations. The results include: Chest x-ray showed cardiomegaly with pulmonary vascular congestion.  V/Q study ordered and pending.  Problem list/ ED course/ Critical interventions/ Medical management: HPI: See above Vital signs within normal range and stable throughout visit. Laboratory/imaging studies significant for: See above. On physical examination, patient is afebrile and appears to have significant back pain.  There was  tenderness to palpation to left paraspinal muscle of the thoracic spine.  No midline tenderness.  I have low suspicion for acute fracture.  D-dimer elevated at 2.43.  Patient is at high risk for PE with hypoxia, tachypnea, low mobility, high BMI, multiple comorbidities, cannot rule out PE at this point.  Patient will require a V/Q study due to history of ESRD and unable to get contrast.  V/Q study ordered and pending.  Patient will require admission due to V/Q study not available until the morning. I have reviewed the patient home medicines and have made adjustments as needed.  Cardiac monitoring/EKG: The patient was maintained on a cardiac monitor.  I personally reviewed and interpreted the cardiac monitor which showed an underlying rhythm of: sinus rhythm.  Additional history obtained: External records from outside source obtained and reviewed including: Chart review including previous notes, labs, imaging.  Consultations obtained: I requested consultation with Dr. Arlean Hopping [facility] hospitalist, and discussed lab and imaging findings as well as pertinent plan.  He/she agreed to admit the patient..  Disposition Admit.  This chart was dictated using voice recognition software.  Despite best efforts to proofread,  errors can occur which can change the documentation meaning.          Final Clinical Impression(s) /  ED Diagnoses Final diagnoses:  Acute left-sided thoracic back pain    Rx / DC Orders ED Discharge Orders     None         Jeanelle Malling, [place] 09/09/22 1133    Gloris Manchester, MD 09/10/22 1558

## 2022-09-08 NOTE — ED Provider Notes (Incomplete)
[place] EMERGENCY DEPARTMENT AT [facility] Provider Note   CSN: [id number] Arrival date & time: 09/08/22  1934     History {Add pertinent medical, surgical, social history, OB history to HPI:1} Chief Complaint  Patient presents with  . Back Pain  . Shortness of Breath    Courtney Gray is a 52 y.o. female with a past medical history of ESRD on dialysis Monday Wednesday Friday, diabetes, gout, hypercholesterol, hypertension present today for evaluation of back pain and shortness of breath.  Patient reports acute onset of back pain during physical therapy today.  Patient states that she was lifting weight, then she had pain in the middle of her back.  She reports that the physical therapist tried to massage the back, but no relief.  She also reports worsening shortness of breath.  Denies any chest pain.  Patient is on 2 L O2 at baseline.  Denies any cough, nasal congestion, fever, nausea or vomiting.   Back Pain Shortness of Breath     Past Medical History:  Diagnosis Date  . Arthritis   . Bacteremia   . Diabetes mellitus without complication (HCC)   . Dialysis complication, subsequent encounter   . ESRD (end stage renal disease) (HCC)   . Gout   . High cholesterol   . Hypertension   . Hypertensive emergency   . Ischemic foot pain at rest 08/03/2021  . Renal disorder    Past Surgical History:  Procedure Laterality Date  . ABDOMINAL AORTOGRAM W/LOWER EXTREMITY N/A 08/07/2021   Procedure: ABDOMINAL AORTOGRAM W/LOWER EXTREMITY;  Surgeon: Victorino Sparrow, MD;  Location: [facility] INVASIVE CV LAB;  Service: Cardiovascular;  Laterality: N/A;  . AMPUTATION Right 08/14/2021   Procedure: AMPUTATION ABOVE KNEE;  Surgeon: Cephus Shelling, MD;  Location: [facility] OR;  Service: Vascular;  Laterality: Right;  . AMPUTATION Left 09/23/2021   Procedure: AMPUTATION MIDDLE FINGER;  Surgeon: Marlyne Beards, MD;  Location: [facility] OR;  Service: Orthopedics;  Laterality: Left;  . AMPUTATION  Left 10/09/2021   Procedure: LEFT ABOVE KNEE AMPUTATION;  Surgeon: Leonie Douglas, MD;  Location: [facility] OR;  Service: Vascular;  Laterality: Left;  . AV FISTULA PLACEMENT     x2, unsuccessful.   . BUBBLE STUDY  09/27/2021   Procedure: BUBBLE STUDY;  Surgeon: Lewayne Bunting, MD;  Location: [facility] ENDOSCOPY;  Service: Cardiovascular;;  . IR FLUORO GUIDE CV LINE LEFT  12/14/2020  . IR FLUORO GUIDE CV LINE LEFT  02/20/2021  . IR FLUORO GUIDE CV LINE LEFT  09/11/2021  . IR FLUORO GUIDE CV LINE LEFT  10/18/2021  . IR FLUORO GUIDE CV LINE LEFT  11/26/2021  . IR FLUORO GUIDE CV LINE LEFT  11/28/2021  . IR FLUORO GUIDE CV LINE LEFT  02/04/2022  . IR FLUORO GUIDE CV LINE LEFT  03/07/2022  . IR FLUORO GUIDE CV LINE RIGHT  08/28/2020  . IR FLUORO GUIDE CV LINE RIGHT  05/13/2021  . IR FLUORO GUIDE CV LINE RIGHT  05/24/2021  . IR INTRAVASCULAR ULTRASOUND NON CORONARY  03/07/2022  . IR PERC TUN PERIT CATH WO PORT S&I Judi Cong  09/06/2020  . IR PTA VENOUS EXCEPT DIALYSIS CIRCUIT  02/20/2021  . IR PTA VENOUS EXCEPT DIALYSIS CIRCUIT  05/13/2021  . IR PTA VENOUS EXCEPT DIALYSIS CIRCUIT  11/28/2021  . IR PTA VENOUS EXCEPT DIALYSIS CIRCUIT  03/07/2022  . IR REMOVAL TUN CV CATH W/O FL  05/24/2021  . IR REMOVAL TUN CV CATH W/O FL  10/15/2021  .  IR US GUIDE VASC ACCESS LEFT  09/06/2020  . IR US GUIDE VASC ACCESS LEFT  09/11/2021  . IR US GUIDE VASC ACCESS LEFT  10/18/2021  . IR US GUIDE VASC ACCESS RIGHT  05/24/2021  . IR US GUIDE VASC ACCESS RIGHT  09/10/2021  . IR VENO/JUGULAR RIGHT  09/10/2021  . IR VENOCAVAGRAM SVC  02/20/2021  . IR VENOCAVAGRAM SVC  11/26/2021  . IR VENOCAVAGRAM SVC  03/07/2022  . TEE WITHOUT CARDIOVERSION N/A 09/27/2021   Procedure: TRANSESOPHAGEAL ECHOCARDIOGRAM (TEE);  Surgeon: Lewayne Bunting, MD;  Location: [facility] ENDOSCOPY;  Service: Cardiovascular;  Laterality: N/A;  . UPPER EXTREMITY VENOGRAPHY Right 12/04/2021   Procedure: UPPER EXTREMITY VENOGRAPHY;  Surgeon: Victorino Sparrow, MD;  Location: [facility] INVASIVE CV LAB;   Service: Cardiovascular;  Laterality: Right;     Home Medications Prior to Admission medications   Medication Sig Start Date End Date Taking? Authorizing Provider  acetaminophen (TYLENOL) 500 MG tablet Take 1,000 mg by mouth as needed for moderate pain.    [provider]  aspirin EC 81 MG tablet Take 1 tablet (81 mg total) by mouth daily. Swallow whole. 05/16/22   [place], Eric J, DO  atorvastatin (LIPITOR) 10 MG tablet Take 1 tablet (10 mg total) by mouth daily. Patient taking differently: Take 10 mg by mouth at bedtime. 11/28/20   Shon Hale, MD  Darbepoetin Alfa (ARANESP) 200 MCG/0.4ML SOSY injection Inject 0.4 mLs (200 mcg total) into the skin every Tuesday at 6 PM. 05/20/22   [place], Alvira Philips, DO  docusate sodium (COLACE) 100 MG capsule Take 100 mg by mouth 2 (two) times daily.    [provider]  famotidine (PEPCID) 20 MG tablet Take 1 tablet (20 mg total) by mouth daily. 05/16/22   [place], Alvira Philips, DO  gabapentin (NEURONTIN) 100 MG capsule Take 1 capsule (100 mg total) by mouth at bedtime. 05/15/22   [place], Eric J, DO  lactulose (CHRONULAC) 10 GM/15ML solution Take 45 mLs (30 g total) by mouth 2 (two) times daily as needed for mild constipation. 05/15/22   [place], Alvira Philips, DO  levothyroxine (SYNTHROID) 100 MCG tablet Take 100 mcg by mouth daily.    [provider]  loperamide (IMODIUM A-D) 2 MG tablet Take 4 mg by mouth as needed for diarrhea or loose stools.    [provider]  loratadine (CLARITIN) 10 MG tablet Take 1 tablet (10 mg total) by mouth daily as needed for allergies. 05/15/22   [place], Alvira Philips, DO  midodrine (PROAMATINE) 10 MG tablet Take 1 tablet (10 mg total) by mouth every Monday, Wednesday, and Friday at 8 PM. Before hemodialysis 05/16/22   [place], Eric J, DO  nitroGLYCERIN (NITROSTAT) 0.4 MG SL tablet Place 1 tablet (0.4 mg total) under the tongue every 5 (five) minutes as needed for chest pain. 05/24/21   [place], Alvira Philips, DO  OXYGEN Inhale  4 L into the lungs See admin instructions. Oxygen at 4L hr via nasal cannula and will use portable oxygen machine when up in wheelchair. 10/28/21   [provider]  pantoprazole (PROTONIX) 40 MG tablet Take 1 tablet (40 mg total) by mouth daily. 11/29/20   Shon Hale, MD  polyethylene glycol (MIRALAX / GLYCOLAX) 17 g packet Take 17 g by mouth daily as needed for mild constipation. 05/15/22   [place], Alvira Philips, DO  polyvinyl alcohol (LIQUIFILM TEARS) 1.4 % ophthalmic solution Place 1 drop into both eyes as needed for dry eyes. 05/15/22   [place],  Alvira Philips, DO  pregabalin (LYRICA) 50 MG capsule Take 1 capsule (50 mg total) by mouth daily for 5 days. 05/27/22 06/01/22  Dorcas Carrow, MD  sevelamer carbonate (RENVELA) 2.4 g PACK Take 4.8 g by mouth 3 (three) times daily with meals. 05/15/22   [place], Alvira Philips, DO  simethicone (MYLICON) 80 MG chewable tablet Chew 80 mg by mouth every 6 (six) hours as needed for flatulence.    [provider]  sodium chloride 0.9 % SOLN 100 mL with sodium thiosulfate 250 MG/ML SOLN 12.5 g Inject 12.5 g into the vein every Monday, Wednesday, and Friday with hemodialysis. 05/16/22   [place], Alvira Philips, DO      Allergies    Benzonatate, Hydralazine, Amoxicillin, Clonidine, Chlorhexidine, Morphine, and Oxycodone    Review of Systems   Review of Systems  Respiratory:  Positive for shortness of breath.   Musculoskeletal:  Positive for back pain.    Physical Exam Updated Vital Signs BP (!) 145/94   Pulse 85   Temp (!) 97.5 F (36.4 C)   Resp 20   SpO2 99%  Physical Exam  ED Results / Procedures / Treatments   Labs (all labs ordered are listed, but only abnormal results are displayed) Labs Reviewed  COMPREHENSIVE METABOLIC PANEL - Abnormal; Notable for the following components:      Result Value   Chloride 96 (*)    BUN 45 (*)    Creatinine, Ser 6.04 (*)    Albumin 3.4 (*)    GFR, Estimated 8 (*)    Anion gap 17 (*)    All other components within  normal limits  CBC - Abnormal; Notable for the following components:   RBC 3.74 (*)    Hemoglobin 10.6 (*)    HCT 34.4 (*)    RDW 18.6 (*)    Platelets 137 (*)    All other components within normal limits  D-DIMER, QUANTITATIVE - Abnormal; Notable for the following components:   D-Dimer, Quant 2.43 (*)    All other components within normal limits    EKG None  Radiology DG Chest Port 1 View  Result Date: 09/08/2022 CLINICAL DATA:  Shortness of breath. EXAM: PORTABLE CHEST 1 VIEW COMPARISON:  December 10, 2021 FINDINGS: There is stable left-sided venous catheter positioning. The cardiac silhouette is moderately enlarged and unchanged in size. There is marked severity calcification of the aortic arch. Mild suprahilar and infrahilar prominence of the pulmonary vasculature is noted. Mild, diffuse, chronic appearing increased interstitial lung markings are noted. There is no evidence of focal consolidation, pleural effusion or pneumothorax. The visualized skeletal structures are unremarkable. IMPRESSION: 1. Cardiomegaly with mild pulmonary vascular congestion. 2. Chronic appearing increased interstitial lung markings. Electronically Signed   By: Aram Candela M.D.   On: 09/08/2022 21:39    Procedures Procedures  {Document cardiac monitor, telemetry assessment procedure when appropriate:1}  Medications Ordered in ED Medications  lidocaine (LIDODERM) 5 % 1 patch (1 patch Transdermal Patch Applied 09/08/22 2027)  HYDROmorphone (DILAUDID) injection 1 mg (1 mg Intravenous Given 09/08/22 2027)  methocarbamol (ROBAXIN) tablet 1,000 mg (1,000 mg Oral Given 09/08/22 2027)  acetaminophen (TYLENOL) tablet 650 mg (650 mg Oral Given 09/08/22 2027)    ED Course/ Medical Decision Making/ A&P   {   Click here for ABCD2, HEART and other calculatorsREFRESH Note before signing :1}  Medical Decision Making Amount and/or Complexity of Data Reviewed Labs: ordered. Radiology:  ordered.  Risk OTC drugs. Prescription drug management.   ***  {Document critical care time when appropriate:1} {Document review of labs and clinical decision tools ie heart score, Chads2Vasc2 etc:1}  {Document your independent review of radiology images, and any outside records:1} {Document your discussion with family members, caretakers, and with consultants:1} {Document social determinants of health affecting pt's care:1} {Document your decision making why or why not admission, treatments were needed:1} Final Clinical Impression(s) / ED Diagnoses Final diagnoses:  None    Rx / DC Orders ED Discharge Orders     None

## 2022-09-08 NOTE — ED Triage Notes (Signed)
Comes from [facility]

## 2022-09-08 NOTE — ED Triage Notes (Signed)
Pt states she had dialysis today, then started having severe back pain and some shortness of breath with the pain. No abnormal lung sounds heard per EMS. She is on 3L O2 at baseline.

## 2022-09-08 NOTE — Progress Notes (Signed)
Reviewed consult for IV. Noted IV documented after time of consult. Per RN, no additional access needed at this time. Consult cleared.

## 2022-09-09 ENCOUNTER — Observation Stay (HOSPITAL_COMMUNITY): Payer: Medicare Other

## 2022-09-09 ENCOUNTER — Other Ambulatory Visit: Payer: Self-pay

## 2022-09-09 ENCOUNTER — Encounter (HOSPITAL_COMMUNITY): Payer: Self-pay | Admitting: Internal Medicine

## 2022-09-09 DIAGNOSIS — R7989 Other specified abnormal findings of blood chemistry: Secondary | ICD-10-CM | POA: Diagnosis present

## 2022-09-09 DIAGNOSIS — R9431 Abnormal electrocardiogram [ECG] [EKG]: Secondary | ICD-10-CM | POA: Diagnosis present

## 2022-09-09 DIAGNOSIS — R0602 Shortness of breath: Secondary | ICD-10-CM | POA: Diagnosis not present

## 2022-09-09 DIAGNOSIS — E039 Hypothyroidism, unspecified: Secondary | ICD-10-CM | POA: Diagnosis present

## 2022-09-09 LAB — CBC WITH DIFFERENTIAL/PLATELET
Abs Immature Granulocytes: 0.03 10*3/uL (ref 0.00–0.07)
Basophils Absolute: 0.1 10*3/uL (ref 0.0–0.1)
Basophils Relative: 1 %
Eosinophils Absolute: 0.2 10*3/uL (ref 0.0–0.5)
Eosinophils Relative: 2 %
HCT: 33.8 % — ABNORMAL LOW (ref 36.0–46.0)
Hemoglobin: 10.4 g/dL — ABNORMAL LOW (ref 12.0–15.0)
Immature Granulocytes: 0 %
Lymphocytes Relative: 15 %
Lymphs Abs: 1.4 10*3/uL (ref 0.7–4.0)
MCH: 29.1 pg (ref 26.0–34.0)
MCHC: 30.8 g/dL (ref 30.0–36.0)
MCV: 94.7 fL (ref 80.0–100.0)
Monocytes Absolute: 0.9 10*3/uL (ref 0.1–1.0)
Monocytes Relative: 10 %
Neutro Abs: 6.8 10*3/uL (ref 1.7–7.7)
Neutrophils Relative %: 72 %
Platelets: 138 10*3/uL — ABNORMAL LOW (ref 150–400)
RBC: 3.57 MIL/uL — ABNORMAL LOW (ref 3.87–5.11)
RDW: 18.6 % — ABNORMAL HIGH (ref 11.5–15.5)
WBC: 9.4 10*3/uL (ref 4.0–10.5)
nRBC: 0 % (ref 0.0–0.2)

## 2022-09-09 LAB — PROCALCITONIN: Procalcitonin: 0.6 ng/mL

## 2022-09-09 LAB — PROTIME-INR
INR: 1.1 (ref 0.8–1.2)
Prothrombin Time: 14.3 seconds (ref 11.4–15.2)

## 2022-09-09 LAB — COMPREHENSIVE METABOLIC PANEL
ALT: 12 U/L (ref 0–44)
AST: 14 U/L — ABNORMAL LOW (ref 15–41)
Albumin: 3.4 g/dL — ABNORMAL LOW (ref 3.5–5.0)
Alkaline Phosphatase: 66 U/L (ref 38–126)
Anion gap: 19 — ABNORMAL HIGH (ref 5–15)
BUN: 53 mg/dL — ABNORMAL HIGH (ref 6–20)
CO2: 22 mmol/L (ref 22–32)
Calcium: 9.7 mg/dL (ref 8.9–10.3)
Chloride: 95 mmol/L — ABNORMAL LOW (ref 98–111)
Creatinine, Ser: 6.51 mg/dL — ABNORMAL HIGH (ref 0.44–1.00)
GFR, Estimated: 7 mL/min — ABNORMAL LOW (ref >60)
Glucose, Bld: 98 mg/dL (ref 70–99)
Potassium: 5.2 mmol/L — ABNORMAL HIGH (ref 3.5–5.1)
Sodium: 136 mmol/L (ref 135–145)
Total Bilirubin: 0.4 mg/dL (ref 0.3–1.2)
Total Protein: 7.5 g/dL (ref 6.5–8.1)

## 2022-09-09 LAB — PHOSPHORUS: Phosphorus: 9 mg/dL — ABNORMAL HIGH (ref 2.5–4.6)

## 2022-09-09 LAB — MAGNESIUM: Magnesium: 2.3 mg/dL (ref 1.7–2.4)

## 2022-09-09 LAB — BRAIN NATRIURETIC PEPTIDE: B Natriuretic Peptide: 943.5 pg/mL — ABNORMAL HIGH (ref 0.0–100.0)

## 2022-09-09 MED ORDER — INSULIN ASPART 100 UNIT/ML IJ SOLN: 0.0000 [IU] | Freq: Three times a day (TID) | INTRAMUSCULAR | Status: DC

## 2022-09-09 MED ORDER — ASPIRIN 81 MG PO TBEC
81.0000 mg | DELAYED_RELEASE_TABLET | Freq: Every day | ORAL | Status: DC
  Administered 2022-09-09: 81 mg via ORAL
  Filled 2022-09-09: qty 1

## 2022-09-09 MED ORDER — HYDROMORPHONE HCL 2 MG PO TABS
1.0000 mg | ORAL_TABLET | ORAL | Status: DC | PRN
  Administered 2022-09-09 (×2): 1 mg via ORAL
  Filled 2022-09-09 (×2): qty 1

## 2022-09-09 MED ORDER — LEVOTHYROXINE SODIUM 100 MCG PO TABS
100.0000 ug | ORAL_TABLET | Freq: Every day | ORAL | Status: DC
  Administered 2022-09-09: 100 ug via ORAL
  Filled 2022-09-09: qty 1

## 2022-09-09 MED ORDER — SEVELAMER CARBONATE 2.4 G PO PACK
4.8000 g | PACK | Freq: Three times a day (TID) | ORAL | Status: DC
  Filled 2022-09-09 (×2): qty 2

## 2022-09-09 MED ORDER — METHOCARBAMOL 500 MG PO TABS: 500.0000 mg | ORAL_TABLET | Freq: Three times a day (TID) | ORAL | 0 refills | Status: DC | PRN

## 2022-09-09 MED ORDER — GABAPENTIN 100 MG PO CAPS: 100.0000 mg | ORAL_CAPSULE | Freq: Every day | ORAL | Status: DC

## 2022-09-09 MED ORDER — LIDOCAINE 5 % EX PTCH: 1.0000 | MEDICATED_PATCH | CUTANEOUS | 0 refills | Status: DC

## 2022-09-09 MED ORDER — METHOCARBAMOL 500 MG PO TABS: 500.0000 mg | ORAL_TABLET | Freq: Three times a day (TID) | ORAL | Status: DC | PRN

## 2022-09-09 MED ORDER — NALOXONE HCL 0.4 MG/ML IJ SOLN: 0.4000 mg | INTRAMUSCULAR | Status: DC | PRN

## 2022-09-09 MED ORDER — HYDROMORPHONE HCL 1 MG/ML IJ SOLN: 1.0000 mg | INTRAMUSCULAR | Status: DC | PRN

## 2022-09-09 MED ORDER — PREGABALIN 25 MG PO CAPS
50.0000 mg | ORAL_CAPSULE | Freq: Every day | ORAL | Status: DC
  Administered 2022-09-09: 50 mg via ORAL
  Filled 2022-09-09: qty 2

## 2022-09-09 MED ORDER — HYDROMORPHONE HCL 2 MG PO TABS: 1.0000 mg | ORAL_TABLET | Freq: Three times a day (TID) | ORAL | 0 refills | Status: AC | PRN

## 2022-09-09 MED ORDER — ALBUTEROL SULFATE (2.5 MG/3ML) 0.083% IN NEBU: 2.5000 mg | INHALATION_SOLUTION | RESPIRATORY_TRACT | Status: DC | PRN

## 2022-09-09 MED ORDER — OXYCODONE-ACETAMINOPHEN 5-325 MG PO TABS: 1.0000 | ORAL_TABLET | Freq: Four times a day (QID) | ORAL | Status: DC | PRN

## 2022-09-09 MED ORDER — FAMOTIDINE 20 MG PO TABS
20.0000 mg | ORAL_TABLET | Freq: Every day | ORAL | Status: DC
  Administered 2022-09-09: 20 mg via ORAL
  Filled 2022-09-09: qty 1

## 2022-09-09 MED ORDER — HYDROCODONE-ACETAMINOPHEN 5-325 MG PO TABS: 2.0000 | ORAL_TABLET | Freq: Four times a day (QID) | ORAL | Status: DC | PRN

## 2022-09-09 MED ORDER — PANTOPRAZOLE SODIUM 40 MG PO TBEC: 40.0000 mg | DELAYED_RELEASE_TABLET | Freq: Every day | ORAL | Status: DC

## 2022-09-09 MED ORDER — PREGABALIN 25 MG PO CAPS: 25.0000 mg | ORAL_CAPSULE | ORAL | Status: DC

## 2022-09-09 MED ORDER — OXYCODONE HCL 5 MG PO CAPS: 5.0000 mg | ORAL_CAPSULE | ORAL | 0 refills | Status: DC | PRN

## 2022-09-09 MED ORDER — HYDROMORPHONE HCL 2 MG PO TABS: 1.0000 mg | ORAL_TABLET | Freq: Three times a day (TID) | ORAL | 0 refills | Status: DC | PRN

## 2022-09-09 MED ORDER — HYDROMORPHONE HCL 1 MG/ML IJ SOLN
0.5000 mg | INTRAMUSCULAR | Status: DC | PRN
  Administered 2022-09-09: 0.5 mg via INTRAVENOUS
  Filled 2022-09-09: qty 1

## 2022-09-09 MED ORDER — IOHEXOL 350 MG/ML SOLN
75.0000 mL | Freq: Once | INTRAVENOUS | Status: AC | PRN
  Administered 2022-09-09: 75 mL via INTRAVENOUS

## 2022-09-09 MED ORDER — ATORVASTATIN CALCIUM 10 MG PO TABS: 10.0000 mg | ORAL_TABLET | Freq: Every day | ORAL | Status: DC

## 2022-09-09 NOTE — Discharge Instructions (Signed)
Take robaxin and tylenol for back pain, if that does not help in 1 hour, take oxycoodne or dilaudid according to pain level.

## 2022-09-09 NOTE — Evaluation (Signed)
Physical Therapy Evaluation Patient Details Name: Courtney Gray MRN: [id number] DOB: [birth year] Today's Date: 09/09/2022  History of Present Illness  Pt is 52 year old presented to [facility] on  09/08/22 for severe back pain with SOB. PMH - DMII, IBS, HTN, ESRD, chronic respiratory failure, CHF, bilateral AKA, PAD, CAD, HLD, calciphylaxis.  Clinical Impression  Pt presents to PT at her recent baseline for mobilty. Pt performs scooting transfer to/from her power w/c at baseline. Recommend return to SNF as quickly as medically possible to allow pt maximal independence with her environment. Further therapy can occur at facility.      Recommendations for follow up therapy are one component of a multi-disciplinary discharge planning process, led by the attending physician.  Recommendations may be updated based on patient status, additional functional criteria and insurance authorization.  Follow Up Recommendations Can patient physically be transported by private vehicle: No     Assistance Recommended at Discharge Intermittent Supervision/Assistance  Patient can return home with the following  A little help with walking and/or transfers;A little help with bathing/dressing/bathroom;Assist for transportation    Equipment Recommendations None recommended by PT  Recommendations for Other Services       Functional Status Assessment Patient has not had a recent decline in their functional status     Precautions / Restrictions Precautions Precautions: Fall      Mobility  Bed Mobility Overal bed mobility: Modified Independent                  Transfers                   General transfer comment: Perfomed scooting on stretcher with supervision    Ambulation/Gait                  Stairs            Wheelchair Mobility    Modified Rankin (Stroke Patients Only)       Balance Overall balance assessment: Needs assistance Sitting-balance support: No upper  extremity supported, Feet unsupported Sitting balance-[name] Scale: Good                                       Pertinent Vitals/Pain Pain Assessment Pain Assessment: No/denies pain (reports back pain is better after pain meds last night)    Home Living Family/patient expects to be discharged to:: Skilled nursing facility                        Prior Function Prior Level of Function : Needs assist       Physical Assist : Mobility (physical) Mobility (physical): Transfers   Mobility Comments: Pt able to perform posterior scoot transfer to power w/c with supervision. Transfers from power chair back to bed with supervision with lateral transfer.       Hand Dominance        Extremity/Trunk Assessment   Upper Extremity Assessment Upper Extremity Assessment: Overall WFL for tasks assessed    Lower Extremity Assessment Lower Extremity Assessment: RLE deficits/detail;LLE deficits/detail RLE Deficits / Details: AKA LLE Deficits / Details: AKA       Communication   Communication: No difficulties  Cognition Arousal/Alertness: Awake/alert Behavior During Therapy: WFL for tasks assessed/performed Overall Cognitive Status: Within Functional Limits for tasks assessed  General Comments General comments (skin integrity, edema, etc.): VSS    Exercises     Assessment/Plan    PT Assessment All further PT needs can be met in the next venue of care  PT Problem List Decreased mobility;Obesity       PT Treatment Interventions      PT Goals (Current goals can be found in the Care Plan section)  Acute Rehab PT Goals PT Goal Formulation: All assessment and education complete, DC therapy    Frequency       Co-evaluation               AM-PAC PT "6 Clicks" Mobility  Outcome Measure Help needed turning from your back to your side while in a flat bed without using bedrails?: None Help  needed moving from lying on your back to sitting on the side of a flat bed without using bedrails?: None Help needed moving to and from a bed to a chair (including a wheelchair)?: A Little Help needed standing up from a chair using your arms (e.g., wheelchair or bedside chair)?: Total Help needed to walk in hospital room?: Total Help needed climbing 3-5 steps with a railing? : Total 6 Click Score: 14    End of Session   Activity Tolerance: Patient tolerated treatment well Patient left: in bed;with call bell/phone within reach;with family/visitor present   PT Visit Diagnosis: Other abnormalities of gait and mobility (R26.89)    Time: 9629-5284 PT Time Calculation (min) (ACUTE ONLY): 13 min   Charges:   PT Evaluation $PT Eval Low Complexity: 1 Low          [facility] PT Acute Rehabilitation Services Office [phone]   Angelina Ok [facility] 09/09/2022, 1:19 PM

## 2022-09-09 NOTE — ED Notes (Signed)
Gave patient some ginger ale  

## 2022-09-09 NOTE — ED Notes (Signed)
PTAR called; 9th in line

## 2022-09-09 NOTE — ED Notes (Signed)
Patient given a meal and some diet ginger ale. Patient is waiting for admission.

## 2022-09-09 NOTE — ED Notes (Signed)
PTAR called; now 2nd in line.

## 2022-09-09 NOTE — ED Notes (Signed)
ED TO INPATIENT HANDOFF REPORT  ED Nurse Name and Phone #: [phone]  S Name/Age/Gender Courtney Gray 52 y.o. female Room/Bed: 028C/028C  Code Status   Code Status: Full Code  Home/SNF/Other Skilled nursing facility [facility] Patient oriented to: self, place, time, and situation Is this baseline? Yes   Triage Complete: Triage complete  Chief Complaint SOB (shortness of breath) [R06.02]  Triage Note Pt states she had dialysis today, then started having severe back pain and some shortness of breath with the pain. No abnormal lung sounds heard per EMS. She is on 3L O2 at baseline.   Comes from [facility]   Allergies Allergies  Allergen Reactions   Benzonatate Other (See Comments)    Hallucinations   Hydralazine Other (See Comments)    Hallucinations   Amoxicillin Itching and Nausea And Vomiting   Clonidine Other (See Comments)    Altered mental state, insomnia    Chlorhexidine Itching    Patient reports it is the CHG baths that cause her itching not the CHG used for caring for her IV/HD access.     Morphine Itching   Oxycodone Itching    Pt tolerated hospital admission 07/2021    Level of Care/Admitting Diagnosis ED Disposition     ED Disposition  Admit   Condition  --   Comment  Hospital Area: [facility] [[id number]]  Level of Care: Progressive [102]  Admit to Progressive based on following criteria: MULTISYSTEM THREATS such as stable sepsis, metabolic/electrolyte imbalance with or without encephalopathy that is responding to early treatment.  May place patient in observation at [facility] or [facility] if equivalent level of care is available:: No  Covid Evaluation: Asymptomatic - no recent exposure (last 10 days) testing not required  Diagnosis: SOB (shortness of breath) [[id number]]  Admitting Physician: Angie Fava [[id number]]  Attending Physician: Angie Fava [[id number]]          B Medical/Surgery History Past Medical  History:  Diagnosis Date   Arthritis    Bacteremia    Diabetes mellitus without complication (HCC)    Dialysis complication, subsequent encounter    ESRD (end stage renal disease) (HCC)    Gout    High cholesterol    Hypertension    Hypertensive emergency    Ischemic foot pain at rest 08/03/2021   Renal disorder    Past Surgical History:  Procedure Laterality Date   ABDOMINAL AORTOGRAM W/LOWER EXTREMITY N/A 08/07/2021   Procedure: ABDOMINAL AORTOGRAM W/LOWER EXTREMITY;  Surgeon: Victorino Sparrow, MD;  Location: [facility] INVASIVE CV LAB;  Service: Cardiovascular;  Laterality: N/A;   AMPUTATION Right 08/14/2021   Procedure: AMPUTATION ABOVE KNEE;  Surgeon: Cephus Shelling, MD;  Location: [facility] OR;  Service: Vascular;  Laterality: Right;   AMPUTATION Left 09/23/2021   Procedure: AMPUTATION MIDDLE FINGER;  Surgeon: Marlyne Beards, MD;  Location: [facility] OR;  Service: Orthopedics;  Laterality: Left;   AMPUTATION Left 10/09/2021   Procedure: LEFT ABOVE KNEE AMPUTATION;  Surgeon: Leonie Douglas, MD;  Location: [facility] OR;  Service: Vascular;  Laterality: Left;   AV FISTULA PLACEMENT     x2, unsuccessful.    BUBBLE STUDY  09/27/2021   Procedure: BUBBLE STUDY;  Surgeon: Lewayne Bunting, MD;  Location: [facility] ENDOSCOPY;  Service: Cardiovascular;;   IR FLUORO GUIDE CV LINE LEFT  12/14/2020   IR FLUORO GUIDE CV LINE LEFT  02/20/2021   IR FLUORO GUIDE CV LINE LEFT  09/11/2021   IR FLUORO GUIDE CV LINE  LEFT  10/18/2021   IR FLUORO GUIDE CV LINE LEFT  11/26/2021   IR FLUORO GUIDE CV LINE LEFT  11/28/2021   IR FLUORO GUIDE CV LINE LEFT  02/04/2022   IR FLUORO GUIDE CV LINE LEFT  03/07/2022   IR FLUORO GUIDE CV LINE RIGHT  08/28/2020   IR FLUORO GUIDE CV LINE RIGHT  05/13/2021   IR FLUORO GUIDE CV LINE RIGHT  05/24/2021   IR INTRAVASCULAR ULTRASOUND NON CORONARY  03/07/2022   IR PERC TUN PERIT CATH WO PORT S&I /IMAG  09/06/2020   IR PTA VENOUS EXCEPT DIALYSIS CIRCUIT  02/20/2021   IR PTA VENOUS EXCEPT DIALYSIS CIRCUIT   05/13/2021   IR PTA VENOUS EXCEPT DIALYSIS CIRCUIT  11/28/2021   IR PTA VENOUS EXCEPT DIALYSIS CIRCUIT  03/07/2022   IR REMOVAL TUN CV CATH W/O FL  05/24/2021   IR REMOVAL TUN CV CATH W/O FL  10/15/2021   IR US GUIDE VASC ACCESS LEFT  09/06/2020   IR US GUIDE VASC ACCESS LEFT  09/11/2021   IR US GUIDE VASC ACCESS LEFT  10/18/2021   IR US GUIDE VASC ACCESS RIGHT  05/24/2021   IR US GUIDE VASC ACCESS RIGHT  09/10/2021   IR VENO/JUGULAR RIGHT  09/10/2021   IR VENOCAVAGRAM SVC  02/20/2021   IR VENOCAVAGRAM SVC  11/26/2021   IR VENOCAVAGRAM SVC  03/07/2022   TEE WITHOUT CARDIOVERSION N/A 09/27/2021   Procedure: TRANSESOPHAGEAL ECHOCARDIOGRAM (TEE);  Surgeon: Lewayne Bunting, MD;  Location: [facility] ENDOSCOPY;  Service: Cardiovascular;  Laterality: N/A;   UPPER EXTREMITY VENOGRAPHY Right 12/04/2021   Procedure: UPPER EXTREMITY VENOGRAPHY;  Surgeon: Victorino Sparrow, MD;  Location: [facility] INVASIVE CV LAB;  Service: Cardiovascular;  Laterality: Right;     A IV Location/Drains/Wounds Patient Lines/Drains/Airways Status     Active Line/Drains/Airways     Name Placement date Placement time Site Days   Peripheral IV 09/08/22 20 G Anterior;Left;Proximal Forearm 09/08/22  2027  Forearm  1   Fistula / Graft Right Upper arm Arteriovenous fistula 11/28/19  --  Upper arm  1016   Hemodialysis Catheter Left Internal jugular Double lumen Permanent (Tunneled) 03/07/22  1105  Internal jugular  186   Pressure Injury 05/26/22 Ischial tuberosity Right Stage 4 - Full thickness tissue loss with exposed bone, tendon or muscle. 4 cms x 2 cms x 5 cms 05/26/22  0800  -- 106   Wound / Incision (Open or Dehisced) 05/26/22 Non-pressure wound Abdomen Left;Lower 4 cms x 3 cms x 0.8 cms full thickness 05/26/22  0800  Abdomen  106   Wound / Incision (Open or Dehisced) 05/26/22 Non-pressure wound Flank Lower;Right;Lateral 5 cms x 1 cms x 0.1 cms full thickness 05/26/22  0800  Flank  106            Intake/Output Last 24 hours No intake or  output data in the 24 hours ending 09/09/22 1103  Labs/Imaging Results for orders placed or performed during the hospital encounter of 09/08/22 (from the past 48 hour(s))  Comprehensive metabolic panel     Status: Abnormal   Collection Time: 09/08/22  8:43 PM  Result Value Ref Range   Sodium 137 135 - 145 mmol/L   Potassium 4.7 3.5 - 5.1 mmol/L   Chloride 96 (L) 98 - 111 mmol/L   CO2 24 22 - 32 mmol/L   Glucose, Bld 96 70 - 99 mg/dL    Comment: Glucose reference range applies only to samples taken after fasting for at least  8 hours.   BUN 45 (H) 6 - 20 mg/dL   Creatinine, Ser 2.95 (H) 0.44 - 1.00 mg/dL   Calcium 9.3 8.9 - 28.4 mg/dL   Total Protein 8.0 6.5 - 8.1 g/dL   Albumin 3.4 (L) 3.5 - 5.0 g/dL   AST 15 15 - 41 U/L   ALT 16 0 - 44 U/L   Alkaline Phosphatase 70 38 - 126 U/L   Total Bilirubin 0.7 0.3 - 1.2 mg/dL   GFR, Estimated 8 (L) >60 mL/min    Comment: (NOTE) Calculated using the CKD-EPI Creatinine Equation (2021)    Anion gap 17 (H) 5 - 15    Comment: Performed at [facility], [address] [place], [place] [address]  CBC     Status: Abnormal   Collection Time: 09/08/22  8:43 PM  Result Value Ref Range   WBC 8.8 4.0 - 10.5 K/uL   RBC 3.74 (L) 3.87 - 5.11 MIL/uL   Hemoglobin 10.6 (L) 12.0 - 15.0 g/dL   HCT 01.0 (L) 27.2 - 53.6 %   MCV 92.0 80.0 - 100.0 fL   MCH 28.3 26.0 - 34.0 pg   MCHC 30.8 30.0 - 36.0 g/dL   RDW 64.4 (H) 03.4 - 74.2 %   Platelets 137 (L) 150 - 400 K/uL   nRBC 0.0 0.0 - 0.2 %    Comment: Performed at [facility], [address], [place], [place] [address]  D-dimer, quantitative     Status: Abnormal   Collection Time: 09/08/22  8:43 PM  Result Value Ref Range   D-Dimer, Quant 2.43 (H) 0.00 - 0.50 ug/mL-FEU    Comment: (NOTE) At the manufacturer cut-off value of 0.5 g/mL FEU, this assay has a negative predictive value of 95-100%.This assay is intended for use in conjunction with a clinical pretest probability (PTP)  assessment model to exclude pulmonary embolism (PE) and deep venous thrombosis (DVT) in outpatients suspected of PE or DVT. Results should be correlated with clinical presentation. Performed at [facility], [address] [place], [place] [address]   Brain natriuretic peptide     Status: Abnormal   Collection Time: 09/09/22  2:31 AM  Result Value Ref Range   B Natriuretic Peptide 943.5 (H) 0.0 - 100.0 pg/mL    Comment: Performed at [facility], [address], [place], [place] [address]  CBC with Differential/Platelet     Status: Abnormal   Collection Time: 09/09/22  2:35 AM  Result Value Ref Range   WBC 9.4 4.0 - 10.5 K/uL   RBC 3.57 (L) 3.87 - 5.11 MIL/uL   Hemoglobin 10.4 (L) 12.0 - 15.0 g/dL   HCT 18.8 (L) 41.6 - 60.6 %   MCV 94.7 80.0 - 100.0 fL   MCH 29.1 26.0 - 34.0 pg   MCHC 30.8 30.0 - 36.0 g/dL   RDW 30.1 (H) 60.1 - 09.3 %   Platelets 138 (L) 150 - 400 K/uL   nRBC 0.0 0.0 - 0.2 %   Neutrophils Relative % 72 %   Neutro Abs 6.8 1.7 - 7.7 K/uL   Lymphocytes Relative 15 %   Lymphs Abs 1.4 0.7 - 4.0 K/uL   Monocytes Relative 10 %   Monocytes Absolute 0.9 0.1 - 1.0 K/uL   Eosinophils Relative 2 %   Eosinophils Absolute 0.2 0.0 - 0.5 K/uL   Basophils Relative 1 %   Basophils Absolute 0.1 0.0 - 0.1 K/uL   Immature Granulocytes 0 %  Abs Immature Granulocytes 0.03 0.00 - 0.07 K/uL    Comment: Performed at [facility], [address] [place], [place] [address]  Comprehensive metabolic panel     Status: Abnormal   Collection Time: 09/09/22  2:35 AM  Result Value Ref Range   Sodium 136 135 - 145 mmol/L   Potassium 5.2 (H) 3.5 - 5.1 mmol/L   Chloride 95 (L) 98 - 111 mmol/L   CO2 22 22 - 32 mmol/L   Glucose, Bld 98 70 - 99 mg/dL    Comment: Glucose reference range applies only to samples taken after fasting for at least 8 hours.   BUN 53 (H) 6 - 20 mg/dL   Creatinine, Ser 1.91 (H) 0.44 - 1.00 mg/dL   Calcium 9.7 8.9 - 47.8 mg/dL   Total Protein 7.5  6.5 - 8.1 g/dL   Albumin 3.4 (L) 3.5 - 5.0 g/dL   AST 14 (L) 15 - 41 U/L   ALT 12 0 - 44 U/L   Alkaline Phosphatase 66 38 - 126 U/L   Total Bilirubin 0.4 0.3 - 1.2 mg/dL   GFR, Estimated 7 (L) >60 mL/min    Comment: (NOTE) Calculated using the CKD-EPI Creatinine Equation (2021)    Anion gap 19 (H) 5 - 15    Comment: Performed at [facility], [address] [place], [place] [address]  Magnesium     Status: None   Collection Time: 09/09/22  2:35 AM  Result Value Ref Range   Magnesium 2.3 1.7 - 2.4 mg/dL    Comment: Performed at [facility], [address], [place], [place] [address]  Phosphorus     Status: Abnormal   Collection Time: 09/09/22  2:35 AM  Result Value Ref Range   Phosphorus 9.0 (H) 2.5 - 4.6 mg/dL    Comment: Performed at [facility], [address] [place], [place] [address]  Procalcitonin     Status: None   Collection Time: 09/09/22  7:08 AM  Result Value Ref Range   Procalcitonin 0.60 ng/mL    Comment:        Interpretation: PCT > 0.5 ng/mL and <= 2 ng/mL: Systemic infection (sepsis) is possible, but other conditions are known to elevate PCT as well. (NOTE)       Sepsis PCT Algorithm           Lower Respiratory Tract                                      Infection PCT Algorithm    ----------------------------     ----------------------------         PCT < 0.25 ng/mL                PCT < 0.10 ng/mL          Strongly encourage             Strongly discourage   discontinuation of antibiotics    initiation of antibiotics    ----------------------------     -----------------------------       PCT 0.25 - 0.50 ng/mL            PCT 0.10 - 0.25 ng/mL               OR       >80% decrease in PCT  Discourage initiation of                                            antibiotics      Encourage discontinuation           of antibiotics    ----------------------------     -----------------------------         PCT >= 0.50 ng/mL               PCT 0.26 - 0.50 ng/mL                AND       <80% decrease in PCT             Encourage initiation of                                             antibiotics       Encourage continuation           of antibiotics    ----------------------------     -----------------------------        PCT >= 0.50 ng/mL                  PCT > 0.50 ng/mL               AND         increase in PCT                  Strongly encourage                                      initiation of antibiotics    Strongly encourage escalation           of antibiotics                                     -----------------------------                                           PCT <= 0.25 ng/mL                                                 OR                                        > 80% decrease in PCT                                      Discontinue / Do not initiate  antibiotics  Performed at [facility], [address] [place], [place] [address]    CT THORACIC SPINE WO CONTRAST  Result Date: 09/09/2022 CLINICAL DATA:  Mid back pain. EXAM: CT THORACIC SPINE WITHOUT CONTRAST TECHNIQUE: Multidetector CT images of the thoracic were obtained using the standard protocol without intravenous contrast. RADIATION DOSE REDUCTION: This exam was performed according to the departmental dose-optimization program which includes automated exposure control, adjustment of the mA and/or kV according to patient size and/or use of iterative reconstruction technique. COMPARISON:  None Available. FINDINGS: Alignment: Normal. Vertebrae: Normal vertebral body heights. No acute fracture or destructive bone lesion. Paraspinal and other soft tissues: Aortic atherosclerosis. Partially visualized left IJ approach dialysis catheter with tip in the right atrium. Coronary atherosclerosis. Bilateral renal atrophy. Disc levels: No spinal canal stenosis or neural foraminal narrowing. IMPRESSION: 1. No acute  fracture or traumatic malalignment of the thoracic spine. 2. No spinal canal stenosis or neural foraminal narrowing. 3. Coronary atherosclerosis. Aortic Atherosclerosis (ICD10-I70.0). Electronically Signed   By: Orvan Falconer M.D.   On: 09/09/2022 08:50   DG Chest Port 1 View  Result Date: 09/08/2022 CLINICAL DATA:  Shortness of breath. EXAM: PORTABLE CHEST 1 VIEW COMPARISON:  December 10, 2021 FINDINGS: There is stable left-sided venous catheter positioning. The cardiac silhouette is moderately enlarged and unchanged in size. There is marked severity calcification of the aortic arch. Mild suprahilar and infrahilar prominence of the pulmonary vasculature is noted. Mild, diffuse, chronic appearing increased interstitial lung markings are noted. There is no evidence of focal consolidation, pleural effusion or pneumothorax. The visualized skeletal structures are unremarkable. IMPRESSION: 1. Cardiomegaly with mild pulmonary vascular congestion. 2. Chronic appearing increased interstitial lung markings. Electronically Signed   By: Aram Candela M.D.   On: 09/08/2022 21:39    Pending Labs Unresulted Labs (From admission, onward)     Start     Ordered   09/09/22 0500  Protime-INR  Tomorrow morning,   R        09/09/22 0430   09/09/22 0429  Hemoglobin A1c  Add-on,   AD       Comments: To assess prior glycemic control    09/09/22 0428   09/08/22 2245  Magnesium  Add-on,   AD        09/08/22 2244            Vitals/Pain Today's Vitals   09/09/22 0750 09/09/22 0900 09/09/22 0905 09/09/22 1015  BP:  134/72  (!) 159/70  Pulse: (!) 55 64  64  Resp: 11 14  (!) 25  Temp:   98.3 F (36.8 C)   TempSrc:   Oral   SpO2: 96% 99%  100%  Weight:      Height:      PainSc:        Isolation Precautions No active isolations  Medications Medications  lidocaine (LIDODERM) 5 % 1 patch (1 patch Transdermal Patch Removed 09/09/22 0833)  acetaminophen (TYLENOL) tablet 650 mg (has no administration in  time range)    Or  acetaminophen (TYLENOL) suppository 650 mg (has no administration in time range)  melatonin tablet 3 mg (has no administration in time range)  ondansetron (ZOFRAN) injection 4 mg (has no administration in time range)  albuterol (PROVENTIL) (2.5 MG/3ML) 0.083% nebulizer solution 2.5 mg (has no administration in time range)  aspirin EC tablet 81 mg (81 mg Oral Given 09/09/22 1025)  atorvastatin (LIPITOR) tablet 10 mg (has no administration in time range)  famotidine (PEPCID)  tablet 20 mg (20 mg Oral Given 09/09/22 1025)  gabapentin (NEURONTIN) capsule 100 mg (has no administration in time range)  levothyroxine (SYNTHROID) tablet 100 mcg (100 mcg Oral Given 09/09/22 0543)  pantoprazole (PROTONIX) EC tablet 40 mg (40 mg Oral Patient Refused/Not Given 09/09/22 0942)  naloxone [facility]) injection 0.4 mg (has no administration in time range)  methocarbamol (ROBAXIN) tablet 500 mg (has no administration in time range)  HYDROmorphone (DILAUDID) tablet 1 mg (1 mg Oral Given 09/09/22 1026)  HYDROmorphone (DILAUDID) injection 1 mg (has no administration in time range)  HYDROmorphone (DILAUDID) injection 1 mg (1 mg Intravenous Given 09/08/22 2027)  methocarbamol (ROBAXIN) tablet 1,000 mg (1,000 mg Oral Given 09/08/22 2027)  acetaminophen (TYLENOL) tablet 650 mg (650 mg Oral Given 09/08/22 2027)    Mobility non-ambulatory sacral decub      Focused Assessments Renal Assessment Handoff:  Hemodialysis Schedule: Hemodialysis Schedule: Monday/Wednesday/Friday Last Hemodialysis date and time: 6/3   Restricted appendage: left hemo dialysis cath chest    R Recommendations: See Admitting Provider Note  Report given to:   Additional Notes: [id number]

## 2022-09-09 NOTE — Consult Note (Signed)
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 Cover with silicone foam. May lift silicone foam daily to replace Silver.  Change silicone foam q3 days and prn soiling.    WOC team will not follow patient at this time. Re-consult if further needs arise.   Thank you,    Priscella Mann MSN, RN-BC, [employer] [phone]

## 2022-09-09 NOTE — Discharge Summary (Signed)
Physician Discharge Summary   Patient: Courtney Gray MRN: [id number] DOB: January 16, 1971  Admit date:     09/08/2022  Discharge date: 09/09/22  Discharge Physician: Lynden Oxford  PCP: Kirstie Peri, MD  Recommendations at discharge: Follow up with PCP in 1 week Take robaxin and tylenol for back pain, if that does not help in 1 hour, take oxycoodne or dilaudid according to pain level.    Follow-up Information     Kirstie Peri, MD. Schedule an appointment as soon as possible for a visit in 1 week(s).   Specialty: Internal Medicine Contact information: [address] [place] [address] [phone]                Discharge Diagnoses: Principal Problem:   SOB (shortness of breath) Active Problems:   ESRD on dialysis (HCC)   DM2 (diabetes mellitus, type 2) (HCC)   History of anemia due to chronic kidney disease   Chronic diastolic CHF (congestive heart failure) (HCC)   Thoracic back pain   HLD (hyperlipidemia)   GERD (gastroesophageal reflux disease)   Positive D dimer   Prolonged QT interval   Acquired hypothyroidism  Assessment and Plan  The patient presented to the hospital with complaints of severe back pain after working with physical therapy.  Patient completed her hemodialysis session before the therapy. Patient reports that her pain is better with pain medication right now. No nausea no vomiting no fever no chills.  No chest pain at the time of my evaluation.  Tolerating oral diet.  Patient is on 2 L of oxygen chronically and currently on 2 L of oxygen saturating 100%.  No tachycardia.  No hypotension. Troponins are negative.  EKG unremarkable. Her back pain appears to be musculoskeletal in nature.  CT of the chest as well as CT thoracic spine negative for any acute abnormality with spine, no rib fractures, no acute muscular hematoma. Recommend Robaxin and Dilaudid for pain control. Patient chronically on Oxy IR which I would recommend to continue for moderate  pain.  Elevated D-dimer. Patient was also found to have elevated D-dimer. Although unclear the significance of it in her status where she has ESRD. They were originally considering a VQ scan as admitting provider as well as ED provider were trying to avoid IV contrast. Patient has received IV contrast multiple times in the past as well as has actually received CT PE protocol in the past 2023. Discussed with patient at risk and she is agreeable to perform a CT of the chest which would provide more information. CT chest PE protocol negative for pulmonary embolism, pneumonia, rib fracture. No further workup necessary.  Risk of polypharmacy. Patient is on Lyrica and gabapentin.  Recommend to be judicious with combination of these 2 medications. Patient also being prescribed with Robaxin and Dilaudid. Recommended to be judicious with the pain medications.   Pain control - [employer] Controlled Substance Reporting System database was reviewed. and patient was instructed, not to drive, operate heavy machinery, perform activities at heights, swimming or participation in water activities or provide baby-sitting services while on Pain, Sleep and Anxiety Medications; until their outpatient Physician has advised to do so again. Also recommended to not to take more than prescribed Pain, Sleep and Anxiety Medications.  Consultants:  none  Procedures performed:  none  DISCHARGE MEDICATION: Allergies as of 09/09/2022       Reactions   Benzonatate Other (See Comments)   Hallucinations   Hydralazine Other (See Comments)   Hallucinations  Amoxicillin Itching, Nausea And Vomiting   Clonidine Other (See Comments)   Altered mental state, insomnia    Chlorhexidine Itching   Patient reports it is the CHG baths that cause her itching not the CHG used for caring for her IV/HD access.     Morphine Itching        Medication List     STOP taking these medications    sevelamer carbonate 2.4 g  Pack Commonly known as: RENVELA       TAKE these medications    acetaminophen 500 MG tablet Commonly known as: TYLENOL Take 1,000 mg by mouth as needed for moderate pain.   aspirin EC 81 MG tablet Take 1 tablet (81 mg total) by mouth daily. Swallow whole.   atorvastatin 10 MG tablet Commonly known as: LIPITOR Take 1 tablet (10 mg total) by mouth daily. What changed: when to take this   Darbepoetin Alfa 200 MCG/0.4ML Sosy injection Commonly known as: ARANESP Inject 0.4 mLs (200 mcg total) into the skin every Tuesday at 6 PM.   diphenhydrAMINE 50 MG capsule Commonly known as: BENADRYL Take 50 mg by mouth at bedtime as needed for sleep.   docusate sodium 100 MG capsule Commonly known as: COLACE Take 100 mg by mouth 2 (two) times daily.   famotidine 20 MG tablet Commonly known as: PEPCID Take 1 tablet (20 mg total) by mouth daily.   gabapentin 100 MG capsule Commonly known as: NEURONTIN Take 100 mg by mouth at bedtime.   HYDROmorphone 2 MG tablet Commonly known as: Dilaudid Take 0.5 tablets (1 mg total) by mouth every 8 (eight) hours as needed for up to 5 days for severe pain.   lactulose 10 GM/15ML solution Commonly known as: CHRONULAC Take 45 mLs (30 g total) by mouth 2 (two) times daily as needed for mild constipation.   levothyroxine 100 MCG tablet Commonly known as: SYNTHROID Take 100 mcg by mouth daily.   lidocaine 5 % Commonly known as: LIDODERM Place 1 patch onto the skin daily. Remove & Discard patch within 12 hours or as directed by MD   loperamide 2 MG tablet Commonly known as: IMODIUM A-D Take 4 mg by mouth as needed for diarrhea or loose stools.   loratadine 10 MG tablet Commonly known as: CLARITIN Take 1 tablet (10 mg total) by mouth daily as needed for allergies.   methocarbamol 500 MG tablet Commonly known as: ROBAXIN Take 1 tablet (500 mg total) by mouth every 8 (eight) hours as needed for muscle spasms.   midodrine 10 MG  tablet Commonly known as: PROAMATINE Take 1 tablet (10 mg total) by mouth every Monday, Wednesday, and Friday at 8 PM. Before hemodialysis   nitroGLYCERIN 0.4 MG SL tablet Commonly known as: NITROSTAT Place 1 tablet (0.4 mg total) under the tongue every 5 (five) minutes as needed for chest pain.   nystatin powder Commonly known as: MYCOSTATIN/NYSTOP Apply 1 Application topically 3 (three) times daily.   omeprazole 40 MG capsule Commonly known as: PRILOSEC Take 40 mg by mouth daily.   oxycodone 5 MG capsule Commonly known as: OXY-IR Take 1 capsule (5 mg total) by mouth every 4 (four) hours as needed (for moderaet pain). What changed: reasons to take this   OXYGEN Inhale 4 L into the lungs See admin instructions. Oxygen at 4L hr via nasal cannula and will use portable oxygen machine when up in wheelchair.   pantoprazole 40 MG tablet Commonly known as: PROTONIX Take 1 tablet (40 mg  total) by mouth daily.   polyethylene glycol 17 g packet Commonly known as: MIRALAX / GLYCOLAX Take 17 g by mouth daily as needed for mild constipation.   polyvinyl alcohol 1.4 % ophthalmic solution Commonly known as: LIQUIFILM TEARS Place 1 drop into both eyes as needed for dry eyes.   pregabalin 25 MG capsule Commonly known as: LYRICA Take 25 mg by mouth every Monday, Wednesday, and Friday. After HD   pregabalin 50 MG capsule Commonly known as: LYRICA Take 50 mg by mouth daily.   simethicone 80 MG chewable tablet Commonly known as: MYLICON Chew 80 mg by mouth every 6 (six) hours as needed for flatulence.   sodium chloride 0.9 % SOLN 100 mL with sodium thiosulfate 250 MG/ML SOLN 12.5 g Inject 12.5 g into the vein every Monday, Wednesday, and Friday with hemodialysis.   Velphoro 500 MG chewable tablet Generic drug: sucroferric oxyhydroxide Chew 500 mg by mouth 3 (three) times daily with meals.               Discharge Care Instructions  (From admission, onward)            Start     Ordered   09/09/22 0000  Discharge wound care:       Comments: Clean R ischial wound with NS, using Q tip applicator pack Silver Hydrofiber into wound bed. Cover with silicone foam. May lift silicone foam daily to replace Silver.  Change silicone foam q3 days and prn soiling   09/09/22 1350           Disposition: SNF Diet recommendation: Regular diet  Discharge Exam: Vitals:   09/09/22 0905 09/09/22 1015 09/09/22 1215 09/09/22 1315  BP:  (!) 159/70 91/61   Pulse:  64 (!) 57 77  Resp:  (!) 25 12 19   Temp: 98.3 F (36.8 C)     TempSrc: Oral     SpO2:  100% 99% 100%  Weight:      Height:       General: Appear in no distress; no visible Abnormal Neck Mass Or lumps, Conjunctiva normal Cardiovascular: S1 and S2 Present, no Murmur, Respiratory: good respiratory effort, Bilateral Air entry present and CTA, no Crackles, no wheezes Abdomen: Bowel Sound present, Non tender  Extremities: bilateral AKA Neurology: alert and oriented to time, place, and person  [facility] Weights   09/09/22 0709  Weight: 116.6 kg   Condition at discharge: stable  The results of significant diagnostics from this hospitalization (including imaging, microbiology, ancillary and laboratory) are listed below for reference.   Imaging Studies: CT Angio Chest Pulmonary Embolism (PE) W or WO Contrast  Result Date: 09/09/2022 CLINICAL DATA:  Evaluate for pulmonary embolus. Back pain and shortness of breath. EXAM: CT ANGIOGRAPHY CHEST WITH CONTRAST TECHNIQUE: Multidetector CT imaging of the chest was performed using the standard protocol during bolus administration of intravenous contrast. Multiplanar CT image reconstructions and MIPs were obtained to evaluate the vascular anatomy. RADIATION DOSE REDUCTION: This exam was performed according to the departmental dose-optimization program which includes automated exposure control, adjustment of the mA and/or kV according to patient size and/or use of iterative  reconstruction technique. CONTRAST:  75mL OMNIPAQUE IOHEXOL 350 MG/ML SOLN COMPARISON:  Chest radiograph 09/08/2022 FINDINGS: Cardiovascular: Central venous catheter tip terminates at the right atrium. Heart is enlarged. No pericardial effusion. Thoracic aortic vascular calcifications. Adequate opacification of the pulmonary arterial system. No intraluminal filling defects identified to suggest acute pulmonary embolus. Mediastinum/Nodes: Similar-appearing prominent 1 cm right paratracheal lymph  node. No definite mediastinal or hilar adenopathy. Normal appearance of the esophagus. Lungs/Pleura: Central airways are patent. Linear bandlike opacities within the left upper lobe, subpleural right lower lobe and right middle lobe, favored to represent scattered atelectasis. No large area pulmonary consolidation. No pleural effusion or pneumothorax. Similar-appearing subpleural scarring within the right lower lobe (image 93; series 6). Upper Abdomen: No acute process. Musculoskeletal: No chest wall abnormality. No acute or significant osseous findings. Review of the MIP images confirms the above findings. IMPRESSION: 1. No CT evidence for acute pulmonary embolus. 2. Linear bandlike opacities within the left upper lobe, subpleural right lower lobe and right middle lobe, favored to represent scattered atelectasis. 3. Cardiomegaly. 4. Aortic atherosclerosis. Electronically Signed   By: Annia Belt M.D.   On: 09/09/2022 13:30   CT THORACIC SPINE WO CONTRAST  Result Date: 09/09/2022 CLINICAL DATA:  Mid back pain. EXAM: CT THORACIC SPINE WITHOUT CONTRAST TECHNIQUE: Multidetector CT images of the thoracic were obtained using the standard protocol without intravenous contrast. RADIATION DOSE REDUCTION: This exam was performed according to the departmental dose-optimization program which includes automated exposure control, adjustment of the mA and/or kV according to patient size and/or use of iterative reconstruction technique.  COMPARISON:  None Available. FINDINGS: Alignment: Normal. Vertebrae: Normal vertebral body heights. No acute fracture or destructive bone lesion. Paraspinal and other soft tissues: Aortic atherosclerosis. Partially visualized left IJ approach dialysis catheter with tip in the right atrium. Coronary atherosclerosis. Bilateral renal atrophy. Disc levels: No spinal canal stenosis or neural foraminal narrowing. IMPRESSION: 1. No acute fracture or traumatic malalignment of the thoracic spine. 2. No spinal canal stenosis or neural foraminal narrowing. 3. Coronary atherosclerosis. Aortic Atherosclerosis (ICD10-I70.0). Electronically Signed   By: Orvan Falconer M.D.   On: 09/09/2022 08:50   DG Chest Port 1 View  Result Date: 09/08/2022 CLINICAL DATA:  Shortness of breath. EXAM: PORTABLE CHEST 1 VIEW COMPARISON:  December 10, 2021 FINDINGS: There is stable left-sided venous catheter positioning. The cardiac silhouette is moderately enlarged and unchanged in size. There is marked severity calcification of the aortic arch. Mild suprahilar and infrahilar prominence of the pulmonary vasculature is noted. Mild, diffuse, chronic appearing increased interstitial lung markings are noted. There is no evidence of focal consolidation, pleural effusion or pneumothorax. The visualized skeletal structures are unremarkable. IMPRESSION: 1. Cardiomegaly with mild pulmonary vascular congestion. 2. Chronic appearing increased interstitial lung markings. Electronically Signed   By: Aram Candela M.D.   On: 09/08/2022 21:39    Microbiology: Results for orders placed or performed during the hospital encounter of 11/01/21  Respiratory (~20 pathogens) panel by PCR     Status: None   Collection Time: 12/18/21  8:46 AM   Specimen: Nasopharyngeal Swab; Respiratory  Result Value Ref Range Status   Adenovirus NOT DETECTED NOT DETECTED Final   Coronavirus 229E NOT DETECTED NOT DETECTED Final    Comment: (NOTE) The Coronavirus on the  Respiratory Panel, DOES NOT test for the novel  Coronavirus (2019 nCoV)    Coronavirus HKU1 NOT DETECTED NOT DETECTED Final   Coronavirus NL63 NOT DETECTED NOT DETECTED Final   Coronavirus OC43 NOT DETECTED NOT DETECTED Final   Metapneumovirus NOT DETECTED NOT DETECTED Final   Rhinovirus / Enterovirus NOT DETECTED NOT DETECTED Final   Influenza A NOT DETECTED NOT DETECTED Final   Influenza B NOT DETECTED NOT DETECTED Final   Parainfluenza Virus 1 NOT DETECTED NOT DETECTED Final   Parainfluenza Virus 2 NOT DETECTED NOT DETECTED Final   Parainfluenza  Virus 3 NOT DETECTED NOT DETECTED Final   Parainfluenza Virus 4 NOT DETECTED NOT DETECTED Final   Respiratory Syncytial Virus NOT DETECTED NOT DETECTED Final   Bordetella pertussis NOT DETECTED NOT DETECTED Final   Bordetella Parapertussis NOT DETECTED NOT DETECTED Final   Chlamydophila pneumoniae NOT DETECTED NOT DETECTED Final   Mycoplasma pneumoniae NOT DETECTED NOT DETECTED Final    Comment: Performed at [facility], [address], [place], [place] [address]  SARS Coronavirus 2 by RT PCR (hospital order, performed in [facility] hospital lab) *cepheid single result test*     Status: None   Collection Time: 12/18/21  8:46 AM  Result Value Ref Range Status   SARS Coronavirus 2 by RT PCR NEGATIVE NEGATIVE Final    Comment: (NOTE) SARS-CoV-2 target nucleic acids are NOT DETECTED.  The SARS-CoV-2 RNA is generally detectable in upper and lower respiratory specimens during the acute phase of infection. The lowest concentration of SARS-CoV-2 viral copies this assay can detect is 250 copies / mL. A negative result does not preclude SARS-CoV-2 infection and should not be used as the sole basis for treatment or other patient management decisions.  A negative result may occur with improper specimen collection / handling, submission of specimen other than nasopharyngeal swab, presence of viral mutation(s) within the areas targeted by this  assay, and inadequate number of viral copies (<250 copies / mL). A negative result must be combined with clinical observations, patient history, and epidemiological information.  Fact Sheet for Patients:   RoadLapTop.co.za  Fact Sheet for Healthcare Providers: http://kim-miller.com/  This test is not yet approved or  cleared by the [place] FDA and has been authorized for detection and/or diagnosis of SARS-CoV-2 by FDA under an Emergency Use Authorization (EUA).  This EUA will remain in effect (meaning this test can be used) for the duration of the COVID-19 declaration under Section 564(b)(1) of the Act, 21 U.S.C. section 360bbb-3(b)(1), unless the authorization is terminated or revoked sooner.  Performed at [facility], [address], [place], [place] [address]    Labs: CBC: Recent Labs  Lab 09/08/22 2043 09/09/22 0235  WBC 8.8 9.4  NEUTROABS  --  6.8  HGB 10.6* 10.4*  HCT 34.4* 33.8*  MCV 92.0 94.7  PLT 137* 138*   Basic Metabolic Panel: Recent Labs  Lab 09/08/22 2043 09/09/22 0235  NA 137 136  K 4.7 5.2*  CL 96* 95*  CO2 24 22  GLUCOSE 96 98  BUN 45* 53*  CREATININE 6.04* 6.51*  CALCIUM 9.3 9.7  MG  --  2.3  PHOS  --  9.0*   Liver Function Tests: Recent Labs  Lab 09/08/22 2043 09/09/22 0235  AST 15 14*  ALT 16 12  ALKPHOS 70 66  BILITOT 0.7 0.4  PROT 8.0 7.5  ALBUMIN 3.4* 3.4*   CBG: No results for input(s): "GLUCAP" in the last 168 hours.  Discharge time spent: greater than 30 minutes.  Signed: Lynden Oxford, MD [facility]

## 2022-09-09 NOTE — Progress Notes (Signed)
CSW spoke with Bertrum Sol with centralized intake who verified patient can return to [facility].

## 2022-09-10 LAB — HEMOGLOBIN A1C
Hgb A1c MFr Bld: 5.1 % (ref 4.8–5.6)
Mean Plasma Glucose: 100 mg/dL

## 2022-09-16 ENCOUNTER — Encounter (HOSPITAL_COMMUNITY): Payer: Self-pay

## 2022-09-16 ENCOUNTER — Other Ambulatory Visit: Payer: Self-pay

## 2022-09-16 ENCOUNTER — Emergency Department (HOSPITAL_COMMUNITY)
Admission: EM | Admit: 2022-09-16 | Discharge: 2022-09-16 | Disposition: A | Discharge status: Skilled Nursing Facility | Payer: Medicare Other | Attending: Emergency Medicine | Admitting: Emergency Medicine

## 2022-09-16 DIAGNOSIS — Z7982 Long term (current) use of aspirin: Secondary | ICD-10-CM | POA: Insufficient documentation

## 2022-09-16 DIAGNOSIS — M546 Pain in thoracic spine: Secondary | ICD-10-CM | POA: Insufficient documentation

## 2022-09-16 MED ORDER — OXYCODONE-ACETAMINOPHEN 5-325 MG PO TABS
1.0000 | ORAL_TABLET | Freq: Once | ORAL | Status: AC
  Administered 2022-09-16: 1 via ORAL
  Filled 2022-09-16: qty 1

## 2022-09-16 MED ORDER — CYCLOBENZAPRINE HCL 10 MG PO TABS: 10.0000 mg | ORAL_TABLET | Freq: Two times a day (BID) | ORAL | 0 refills | Status: DC | PRN

## 2022-09-16 MED ORDER — CYCLOBENZAPRINE HCL 10 MG PO TABS
10.0000 mg | ORAL_TABLET | Freq: Once | ORAL | Status: AC
  Administered 2022-09-16: 10 mg via ORAL
  Filled 2022-09-16: qty 1

## 2022-09-16 MED ORDER — HYDROMORPHONE HCL 2 MG PO TABS
1.0000 mg | ORAL_TABLET | Freq: Once | ORAL | Status: AC
  Administered 2022-09-16: 1 mg via ORAL
  Filled 2022-09-16: qty 1

## 2022-09-16 MED ORDER — HYDROMORPHONE HCL 1 MG/ML IJ SOLN: 1.0000 mg | Freq: Once | INTRAMUSCULAR | Status: DC

## 2022-09-16 NOTE — ED Notes (Signed)
ETA  4 hours

## 2022-09-16 NOTE — ED Notes (Addendum)
EKG done and given to Dr. Karene Fry; EDP notified of reported syncopal episodes

## 2022-09-16 NOTE — ED Provider Notes (Signed)
   Nurse stating that patient's [facility] transport has not arrived, and patient is complaining of pain. Patient takes Oxy 5mg  at home for pain - will provide her with one dose here in ED while she waits for her transportation.     Courtney Gray, [place] 09/16/22 2226    Glyn Ade, MD 09/17/22 1442

## 2022-09-16 NOTE — ED Notes (Signed)
Pt states that she sometimes passes out due to the back pain; states she has already has 2 syncopal episodes while here

## 2022-09-16 NOTE — ED Notes (Signed)
Pt endorses difficulty breathing due to pain; wears 3-2 L O2 PRN at home; pt requesting O2; sates 95% RA; pt placed on 3L for comfort

## 2022-09-16 NOTE — ED Provider Notes (Cosign Needed Addendum)
[place] EMERGENCY DEPARTMENT AT [facility] Provider Note   CSN: [id number] Arrival date & time: 09/16/22  1326     History  Chief Complaint  Patient presents with   Back Pain    Courtney Gray is a 52 y.o. female status post bilateral leg amputation, history of ESRD on dialysis Monday Wednesday Friday, NSTEMI, chronic back pain, sepsis, diabetes, CAD presented with back pain has been present for 5 days.  Patient was recently seen last week for the same chief concern after she was at physical therapy lifting weights.  Patient is tender in the left thoracic region and states she has been unable to pick up the Dilaudid that was prescribed to her and cannot pick it up as the prescription has expired.  Patient has not been using the muscle relaxers as she does not like how it makes her feel and is afraid of the side effects. Patient has been using oxycodone at home to no relief but states the pain is the last as last week with no new features.  Patient denied chest pain, shortness of breath, fevers, change in sensation ecchymosis, new onset weakness  Home Medications Prior to Admission medications   Medication Sig Start Date End Date Taking? Authorizing Provider  cyclobenzaprine (FLEXERIL) 10 MG tablet Take 1 tablet (10 mg total) by mouth 2 (two) times daily as needed for muscle spasms. 09/16/22  Yes Netta Corrigan, PA-C  acetaminophen (TYLENOL) 500 MG tablet Take 1,000 mg by mouth as needed for moderate pain.    [provider]  aspirin EC 81 MG tablet Take 1 tablet (81 mg total) by mouth daily. Swallow whole. 05/16/22   [place], Eric J, DO  atorvastatin (LIPITOR) 10 MG tablet Take 1 tablet (10 mg total) by mouth daily. Patient taking differently: Take 10 mg by mouth at bedtime. 11/28/20   Shon Hale, MD  Darbepoetin Alfa (ARANESP) 200 MCG/0.4ML SOSY injection Inject 0.4 mLs (200 mcg total) into the skin every Tuesday at 6 PM. 05/20/22   [place], Alvira Philips, DO   diphenhydrAMINE (BENADRYL) 50 MG capsule Take 50 mg by mouth at bedtime as needed for sleep.    [provider]  docusate sodium (COLACE) 100 MG capsule Take 100 mg by mouth 2 (two) times daily.    [provider]  famotidine (PEPCID) 20 MG tablet Take 1 tablet (20 mg total) by mouth daily. 05/16/22   [place], Alvira Philips, DO  gabapentin (NEURONTIN) 100 MG capsule Take 100 mg by mouth at bedtime.    [provider]  lactulose (CHRONULAC) 10 GM/15ML solution Take 45 mLs (30 g total) by mouth 2 (two) times daily as needed for mild constipation. 05/15/22   [place], Alvira Philips, DO  levothyroxine (SYNTHROID) 100 MCG tablet Take 100 mcg by mouth daily.    [provider]  lidocaine (LIDODERM) 5 % Place 1 patch onto the skin daily. Remove & Discard patch within 12 hours or as directed by MD 09/09/22   Rolly Salter, MD  loperamide (IMODIUM A-D) 2 MG tablet Take 4 mg by mouth as needed for diarrhea or loose stools.    [provider]  loratadine (CLARITIN) 10 MG tablet Take 1 tablet (10 mg total) by mouth daily as needed for allergies. 05/15/22   [place], Alvira Philips, DO  midodrine (PROAMATINE) 10 MG tablet Take 1 tablet (10 mg total) by mouth every Monday, Wednesday, and Friday at 8 PM. Before hemodialysis 05/16/22   [place], Alvira Philips, DO  nitroGLYCERIN (NITROSTAT) 0.4 MG SL tablet Place 1 tablet (0.4 mg total) under the tongue every 5 (five) minutes as needed for chest pain. 05/24/21   [place], Alvira Philips, DO  nystatin (MYCOSTATIN/NYSTOP) powder Apply 1 Application topically 3 (three) times daily.    [provider]  omeprazole (PRILOSEC) 40 MG capsule Take 40 mg by mouth daily.    [provider]  oxycodone (OXY-IR) 5 MG capsule Take 1 capsule (5 mg total) by mouth every 4 (four) hours as needed (for moderaet pain). 09/09/22   Rolly Salter, MD  OXYGEN Inhale 4 L into the lungs See admin instructions. Oxygen at 4L hr via nasal cannula and will use portable oxygen machine  when up in wheelchair. 10/28/21   [provider]  pantoprazole (PROTONIX) 40 MG tablet Take 1 tablet (40 mg total) by mouth daily. 11/29/20   Shon Hale, MD  polyethylene glycol (MIRALAX / GLYCOLAX) 17 g packet Take 17 g by mouth daily as needed for mild constipation. 05/15/22   [place], Alvira Philips, DO  polyvinyl alcohol (LIQUIFILM TEARS) 1.4 % ophthalmic solution Place 1 drop into both eyes as needed for dry eyes. 05/15/22   [place], Alvira Philips, DO  pregabalin (LYRICA) 25 MG capsule Take 25 mg by mouth every Monday, Wednesday, and Friday. After HD    [provider]  pregabalin (LYRICA) 50 MG capsule Take 50 mg by mouth daily.    [provider]  simethicone (MYLICON) 80 MG chewable tablet Chew 80 mg by mouth every 6 (six) hours as needed for flatulence.    [provider]  sodium chloride 0.9 % SOLN 100 mL with sodium thiosulfate 250 MG/ML SOLN 12.5 g Inject 12.5 g into the vein every Monday, Wednesday, and Friday with hemodialysis. 05/16/22   [place], Alvira Philips, DO  sucroferric oxyhydroxide (VELPHORO) 500 MG chewable tablet Chew 500 mg by mouth 3 (three) times daily with meals.    [provider]      Allergies    Benzonatate, Hydralazine, Amoxicillin, Clonidine, Chlorhexidine, and Morphine    Review of Systems   Review of Systems  Musculoskeletal:  Positive for back pain.    Physical Exam Updated Vital Signs BP 133/60   Pulse 64   Temp 97.8 F (36.6 C)   Resp 18   SpO2 95%  Physical Exam Constitutional:      General: She is not in acute distress. Cardiovascular:     Rate and Rhythm: Normal rate and regular rhythm.     Pulses: Normal pulses.     Heart sounds: Normal heart sounds.  Pulmonary:     Effort: Pulmonary effort is normal. No respiratory distress.     Breath sounds: Normal breath sounds.  Abdominal:     Palpations: Abdomen is soft.     Tenderness: There is no abdominal tenderness. There is no right CVA tenderness, left CVA tenderness,  guarding or rebound.  Musculoskeletal:     Comments: Tender palpation left mid thoracic muscles No midline tenderness or abnormalities palpated Bilateral leg amputations  Skin:    General: Skin is warm and dry.  Neurological:     Mental Status: She is alert.     Comments: Sensation intact in upper extremities     ED Results / Procedures / Treatments   Labs (all labs ordered are listed, but only abnormal results are displayed) Labs Reviewed - No data to display  EKG None  Radiology No results found.  Procedures Procedures    Medications  Ordered in ED Medications  HYDROmorphone (DILAUDID) tablet 1 mg (1 mg Oral Given 09/16/22 1502)  cyclobenzaprine (FLEXERIL) tablet 10 mg (10 mg Oral Given 09/16/22 1621)    ED Course/ Medical Decision Making/ A&P                             Medical Decision Making Risk Prescription drug management.   Giavana Gath 51 y.o. presented today for back pain. Working DDx that I considered at this time includes, but not limited to, MSK, underlying fracture, epidural hematoma, cauda equina syndrome, spinal stenosis, spinal malignancy, dural abscess, discitis, spinal infection.  R/o DDx: underlying fracture, epidural hematoma, cauda equina syndrome, spinal stenosis, spinal malignancy, dural abscess, discitis, spinal infection : less likely due to history of present illness and physical exam findings  Review of prior external notes: 09/09/2022 discharge summary  Unique Tests and My Interpretation: None  Discussion with Independent Historian:  Daughter  Discussion of Management of Tests: None  Risk: Medium: prescription drug management  Risk Stratification Score: None  Plan: Patient presented for back pain. On exam patient was in no acute distress and stable vitals.  Patient was tender in the left mid thoracic muscles but did not have any midline tenderness abdomen is palpated.  Patient does have history of chronic back pain and this  pain has been the same since last week when she was a PT. patient had a large workup done last week that was ultimately negative and most likely has back pain secondary to musculoskeletal strain.  Due to large workup done last week with no new symptoms and reassuring physical exam advanced imaging was withheld this time. We will continue the original plan as patient was unable to take the medications prescribed. This is most likely a muscle spasm or strain causing her symptoms however patient be given 1 dose of Dilaudid here orally and reevaluate and discharged with primary care follow-up.  Patient states that she does not take the Robaxin due to being afraid of side effects and will be given Flexeril and encouraged to use a heat pack.  Patient was given Flexeril before discharge and encouraged to follow-up with her primary care provider. Patient states she still have Percocets at home and so will not re-prescribe.  Patient was given return precautions. Patient stable for discharge at this time.  Patient verbalized understanding of plan.   Final Clinical Impression(s) / ED Diagnoses Final diagnoses:  Left-sided thoracic back pain, unspecified chronicity    Rx / DC Orders ED Discharge Orders          Ordered    cyclobenzaprine (FLEXERIL) 10 MG tablet  2 times daily PRN        09/16/22 1611               Netta Corrigan, PA-C 09/16/22 2034    Ernie Avena, MD 09/17/22 [phone]

## 2022-09-16 NOTE — ED Triage Notes (Signed)
Pt bib ems from [facility]; c/o back pain and spasms x 1 week; seen for same last week, given dilaudid, requesting same today; prescribed muscle relaxer and pain med but not able to get them; 124/58, P 70, 98% RA; pt refused cbg; took 10 mg oxy 1 hour PTA without relief

## 2022-09-16 NOTE — Discharge Instructions (Signed)
Please follow-up with your primary care provider regarding recent symptoms and ER visit.  Please take the Percocet at home as prescribed and use Tylenol every 6 hours.  You may use the Flexeril at night as it is a very sedating medication.  Please use a heat pack on your back to help relax your muscles.  If symptoms worsen or change please return to ER.

## 2022-10-02 ENCOUNTER — Ambulatory Visit: Payer: 59 | Admitting: Nurse Practitioner

## 2022-11-05 ENCOUNTER — Emergency Department (HOSPITAL_COMMUNITY): Payer: 59

## 2022-11-05 ENCOUNTER — Encounter (HOSPITAL_COMMUNITY): Payer: Self-pay

## 2022-11-05 ENCOUNTER — Inpatient Hospital Stay (HOSPITAL_COMMUNITY)
Admission: EM | Admit: 2022-11-05 | Discharge: 2022-11-13 | DRG: 291 | Disposition: A | Discharge status: Skilled Nursing Facility | Payer: 59 | Attending: Internal Medicine | Admitting: Internal Medicine

## 2022-11-05 ENCOUNTER — Other Ambulatory Visit: Payer: Self-pay

## 2022-11-05 DIAGNOSIS — Z6841 Body Mass Index (BMI) 40.0 and over, adult: Secondary | ICD-10-CM

## 2022-11-05 DIAGNOSIS — Z1152 Encounter for screening for COVID-19: Secondary | ICD-10-CM

## 2022-11-05 DIAGNOSIS — E860 Dehydration: Secondary | ICD-10-CM | POA: Diagnosis present

## 2022-11-05 DIAGNOSIS — N186 End stage renal disease: Secondary | ICD-10-CM

## 2022-11-05 DIAGNOSIS — E039 Hypothyroidism, unspecified: Secondary | ICD-10-CM | POA: Diagnosis present

## 2022-11-05 DIAGNOSIS — K5900 Constipation, unspecified: Secondary | ICD-10-CM | POA: Diagnosis present

## 2022-11-05 DIAGNOSIS — G8929 Other chronic pain: Secondary | ICD-10-CM | POA: Diagnosis present

## 2022-11-05 DIAGNOSIS — Z880 Allergy status to penicillin: Secondary | ICD-10-CM

## 2022-11-05 DIAGNOSIS — Z79899 Other long term (current) drug therapy: Secondary | ICD-10-CM

## 2022-11-05 DIAGNOSIS — E7800 Pure hypercholesterolemia, unspecified: Secondary | ICD-10-CM | POA: Diagnosis present

## 2022-11-05 DIAGNOSIS — I70203 Unspecified atherosclerosis of native arteries of extremities, bilateral legs: Secondary | ICD-10-CM | POA: Diagnosis present

## 2022-11-05 DIAGNOSIS — Z833 Family history of diabetes mellitus: Secondary | ICD-10-CM

## 2022-11-05 DIAGNOSIS — I132 Hypertensive heart and chronic kidney disease with heart failure and with stage 5 chronic kidney disease, or end stage renal disease: Principal | ICD-10-CM | POA: Diagnosis present

## 2022-11-05 DIAGNOSIS — R531 Weakness: Secondary | ICD-10-CM | POA: Diagnosis not present

## 2022-11-05 DIAGNOSIS — E1151 Type 2 diabetes mellitus with diabetic peripheral angiopathy without gangrene: Secondary | ICD-10-CM | POA: Diagnosis present

## 2022-11-05 DIAGNOSIS — I5033 Acute on chronic diastolic (congestive) heart failure: Secondary | ICD-10-CM | POA: Diagnosis not present

## 2022-11-05 DIAGNOSIS — I509 Heart failure, unspecified: Secondary | ICD-10-CM

## 2022-11-05 DIAGNOSIS — R7989 Other specified abnormal findings of blood chemistry: Secondary | ICD-10-CM | POA: Diagnosis not present

## 2022-11-05 DIAGNOSIS — R109 Unspecified abdominal pain: Secondary | ICD-10-CM | POA: Diagnosis present

## 2022-11-05 DIAGNOSIS — E1122 Type 2 diabetes mellitus with diabetic chronic kidney disease: Secondary | ICD-10-CM | POA: Diagnosis present

## 2022-11-05 DIAGNOSIS — G4700 Insomnia, unspecified: Secondary | ICD-10-CM | POA: Diagnosis not present

## 2022-11-05 DIAGNOSIS — N2581 Secondary hyperparathyroidism of renal origin: Secondary | ICD-10-CM | POA: Diagnosis present

## 2022-11-05 DIAGNOSIS — J9611 Chronic respiratory failure with hypoxia: Secondary | ICD-10-CM | POA: Diagnosis present

## 2022-11-05 DIAGNOSIS — Z9981 Dependence on supplemental oxygen: Secondary | ICD-10-CM

## 2022-11-05 DIAGNOSIS — D631 Anemia in chronic kidney disease: Secondary | ICD-10-CM | POA: Diagnosis present

## 2022-11-05 DIAGNOSIS — Z91018 Allergy to other foods: Secondary | ICD-10-CM

## 2022-11-05 DIAGNOSIS — I5189 Other ill-defined heart diseases: Secondary | ICD-10-CM

## 2022-11-05 DIAGNOSIS — E875 Hyperkalemia: Secondary | ICD-10-CM | POA: Diagnosis present

## 2022-11-05 DIAGNOSIS — K219 Gastro-esophageal reflux disease without esophagitis: Secondary | ICD-10-CM | POA: Diagnosis present

## 2022-11-05 DIAGNOSIS — I2510 Atherosclerotic heart disease of native coronary artery without angina pectoris: Secondary | ICD-10-CM | POA: Diagnosis present

## 2022-11-05 DIAGNOSIS — Z885 Allergy status to narcotic agent status: Secondary | ICD-10-CM

## 2022-11-05 DIAGNOSIS — M898X9 Other specified disorders of bone, unspecified site: Secondary | ICD-10-CM | POA: Diagnosis present

## 2022-11-05 DIAGNOSIS — I252 Old myocardial infarction: Secondary | ICD-10-CM

## 2022-11-05 DIAGNOSIS — R197 Diarrhea, unspecified: Secondary | ICD-10-CM | POA: Diagnosis not present

## 2022-11-05 DIAGNOSIS — I953 Hypotension of hemodialysis: Secondary | ICD-10-CM | POA: Diagnosis present

## 2022-11-05 DIAGNOSIS — L89314 Pressure ulcer of right buttock, stage 4: Secondary | ICD-10-CM | POA: Diagnosis present

## 2022-11-05 DIAGNOSIS — I340 Nonrheumatic mitral (valve) insufficiency: Secondary | ICD-10-CM | POA: Diagnosis present

## 2022-11-05 DIAGNOSIS — Z7982 Long term (current) use of aspirin: Secondary | ICD-10-CM

## 2022-11-05 DIAGNOSIS — M549 Dorsalgia, unspecified: Secondary | ICD-10-CM | POA: Diagnosis present

## 2022-11-05 DIAGNOSIS — Z89612 Acquired absence of left leg above knee: Secondary | ICD-10-CM

## 2022-11-05 DIAGNOSIS — M109 Gout, unspecified: Secondary | ICD-10-CM | POA: Diagnosis present

## 2022-11-05 DIAGNOSIS — Z992 Dependence on renal dialysis: Secondary | ICD-10-CM

## 2022-11-05 DIAGNOSIS — R5383 Other fatigue: Secondary | ICD-10-CM | POA: Diagnosis present

## 2022-11-05 DIAGNOSIS — Z89611 Acquired absence of right leg above knee: Secondary | ICD-10-CM

## 2022-11-05 DIAGNOSIS — E8770 Fluid overload, unspecified: Secondary | ICD-10-CM

## 2022-11-05 DIAGNOSIS — I5043 Acute on chronic combined systolic (congestive) and diastolic (congestive) heart failure: Secondary | ICD-10-CM | POA: Diagnosis present

## 2022-11-05 DIAGNOSIS — G546 Phantom limb syndrome with pain: Secondary | ICD-10-CM | POA: Diagnosis present

## 2022-11-05 DIAGNOSIS — Z79890 Hormone replacement therapy (postmenopausal): Secondary | ICD-10-CM

## 2022-11-05 LAB — COMPREHENSIVE METABOLIC PANEL
ALT: 21 U/L (ref 0–44)
AST: 23 U/L (ref 15–41)
Albumin: 3.7 g/dL (ref 3.5–5.0)
Alkaline Phosphatase: 80 U/L (ref 38–126)
Anion gap: 20 — ABNORMAL HIGH (ref 5–15)
BUN: 55 mg/dL — ABNORMAL HIGH (ref 6–20)
CO2: 23 mmol/L (ref 22–32)
Calcium: 9.4 mg/dL (ref 8.9–10.3)
Chloride: 94 mmol/L — ABNORMAL LOW (ref 98–111)
Creatinine, Ser: 9.93 mg/dL — ABNORMAL HIGH (ref 0.44–1.00)
GFR, Estimated: 4 mL/min — ABNORMAL LOW (ref >60)
Glucose, Bld: 87 mg/dL (ref 70–99)
Potassium: 5.3 mmol/L — ABNORMAL HIGH (ref 3.5–5.1)
Sodium: 137 mmol/L (ref 135–145)
Total Bilirubin: 0.8 mg/dL (ref 0.3–1.2)
Total Protein: 8.1 g/dL (ref 6.5–8.1)

## 2022-11-05 LAB — I-STAT VENOUS BLOOD GAS, ED
Acid-base deficit: 2 mmol/L (ref 0.0–2.0)
Bicarbonate: 23.7 mmol/L (ref 20.0–28.0)
Calcium, Ion: 1.05 mmol/L — ABNORMAL LOW (ref 1.15–1.40)
HCT: 36 % (ref 36.0–46.0)
Hemoglobin: 12.2 g/dL (ref 12.0–15.0)
O2 Saturation: 78 %
Potassium: 5.3 mmol/L — ABNORMAL HIGH (ref 3.5–5.1)
Sodium: 135 mmol/L (ref 135–145)
TCO2: 25 mmol/L (ref 22–32)
pCO2, Ven: 44.7 mmHg (ref 44–60)
pH, Ven: 7.331 (ref 7.25–7.43)
pO2, Ven: 46 mmHg — ABNORMAL HIGH (ref 32–45)

## 2022-11-05 LAB — CBC WITH DIFFERENTIAL/PLATELET
Abs Immature Granulocytes: 0.03 10*3/uL (ref 0.00–0.07)
Basophils Absolute: 0 10*3/uL (ref 0.0–0.1)
Basophils Relative: 1 %
Eosinophils Absolute: 0.2 10*3/uL (ref 0.0–0.5)
Eosinophils Relative: 3 %
HCT: 36.5 % (ref 36.0–46.0)
Hemoglobin: 11.2 g/dL — ABNORMAL LOW (ref 12.0–15.0)
Immature Granulocytes: 1 %
Lymphocytes Relative: 14 %
Lymphs Abs: 1 10*3/uL (ref 0.7–4.0)
MCH: 29.2 pg (ref 26.0–34.0)
MCHC: 30.7 g/dL (ref 30.0–36.0)
MCV: 95.3 fL (ref 80.0–100.0)
Monocytes Absolute: 0.8 10*3/uL (ref 0.1–1.0)
Monocytes Relative: 12 %
Neutro Abs: 4.6 10*3/uL (ref 1.7–7.7)
Neutrophils Relative %: 69 %
Platelets: 96 10*3/uL — ABNORMAL LOW (ref 150–400)
RBC: 3.83 MIL/uL — ABNORMAL LOW (ref 3.87–5.11)
RDW: 15.9 % — ABNORMAL HIGH (ref 11.5–15.5)
WBC: 6.7 10*3/uL (ref 4.0–10.5)
nRBC: 0 % (ref 0.0–0.2)

## 2022-11-05 LAB — CBG MONITORING, ED
Glucose-Capillary: 69 mg/dL — ABNORMAL LOW (ref 70–99)
Glucose-Capillary: 82 mg/dL (ref 70–99)

## 2022-11-05 LAB — RESP PANEL BY RT-PCR (RSV, FLU A&B, COVID)  RVPGX2
Influenza A by PCR: NEGATIVE
Influenza B by PCR: NEGATIVE
Resp Syncytial Virus by PCR: NEGATIVE
SARS Coronavirus 2 by RT PCR: NEGATIVE

## 2022-11-05 LAB — CULTURE, BLOOD (ROUTINE X 2): Special Requests: ADEQUATE

## 2022-11-05 LAB — TROPONIN I (HIGH SENSITIVITY)
Troponin I (High Sensitivity): 242 ng/L (ref <18)
Troponin I (High Sensitivity): 239 ng/L (ref <18)

## 2022-11-05 LAB — LIPASE, BLOOD: Lipase: 36 U/L (ref 11–51)

## 2022-11-05 LAB — BRAIN NATRIURETIC PEPTIDE: B Natriuretic Peptide: 1832.9 pg/mL — ABNORMAL HIGH (ref 0.0–100.0)

## 2022-11-05 MED ORDER — DIPHENHYDRAMINE HCL 25 MG PO CAPS
50.0000 mg | ORAL_CAPSULE | Freq: Every evening | ORAL | Status: DC | PRN
  Filled 2022-11-05 (×3): qty 2

## 2022-11-05 MED ORDER — DICYCLOMINE HCL 10 MG PO CAPS
10.0000 mg | ORAL_CAPSULE | Freq: Three times a day (TID) | ORAL | Status: DC
  Administered 2022-11-05 – 2022-11-13 (×21): 10 mg via ORAL
  Filled 2022-11-05 (×23): qty 1

## 2022-11-05 MED ORDER — HEPARIN SODIUM (PORCINE) 5000 UNIT/ML IJ SOLN
5000.0000 [IU] | Freq: Three times a day (TID) | INTRAMUSCULAR | Status: DC
  Filled 2022-11-05 (×6): qty 1

## 2022-11-05 MED ORDER — ASPIRIN 81 MG PO TBEC
81.0000 mg | DELAYED_RELEASE_TABLET | Freq: Every day | ORAL | Status: DC
  Administered 2022-11-05 – 2022-11-13 (×9): 81 mg via ORAL
  Filled 2022-11-05 (×10): qty 1

## 2022-11-05 MED ORDER — LEVOTHYROXINE SODIUM 100 MCG PO TABS: 100.0000 ug | ORAL_TABLET | Freq: Every day | ORAL | Status: DC

## 2022-11-05 MED ORDER — MIDODRINE HCL 5 MG PO TABS
10.0000 mg | ORAL_TABLET | ORAL | Status: DC
  Administered 2022-11-06 – 2022-11-12 (×3): 10 mg via ORAL
  Filled 2022-11-05 (×6): qty 2

## 2022-11-05 MED ORDER — CYCLOBENZAPRINE HCL 10 MG PO TABS
10.0000 mg | ORAL_TABLET | Freq: Two times a day (BID) | ORAL | Status: DC | PRN
  Administered 2022-11-05 – 2022-11-12 (×12): 10 mg via ORAL
  Filled 2022-11-05 (×13): qty 1

## 2022-11-05 MED ORDER — ATORVASTATIN CALCIUM 10 MG PO TABS
10.0000 mg | ORAL_TABLET | Freq: Every day | ORAL | Status: DC
  Administered 2022-11-05 – 2022-11-13 (×9): 10 mg via ORAL
  Filled 2022-11-05 (×9): qty 1

## 2022-11-05 MED ORDER — LEVOTHYROXINE SODIUM 100 MCG PO TABS
100.0000 ug | ORAL_TABLET | Freq: Every day | ORAL | Status: DC
  Administered 2022-11-06 – 2022-11-13 (×7): 100 ug via ORAL
  Filled 2022-11-05 (×9): qty 1

## 2022-11-05 MED ORDER — OXYCODONE-ACETAMINOPHEN 5-325 MG PO TABS
1.0000 | ORAL_TABLET | Freq: Four times a day (QID) | ORAL | Status: DC | PRN
  Administered 2022-11-05 – 2022-11-07 (×6): 1 via ORAL
  Filled 2022-11-05 (×6): qty 1

## 2022-11-05 MED ORDER — GABAPENTIN 100 MG PO CAPS
200.0000 mg | ORAL_CAPSULE | Freq: Every day | ORAL | Status: DC
  Administered 2022-11-05 – 2022-11-13 (×9): 200 mg via ORAL
  Filled 2022-11-05 (×9): qty 2

## 2022-11-05 MED ORDER — PANTOPRAZOLE SODIUM 40 MG PO TBEC
40.0000 mg | DELAYED_RELEASE_TABLET | Freq: Every day | ORAL | Status: DC
  Administered 2022-11-05 – 2022-11-13 (×9): 40 mg via ORAL
  Filled 2022-11-05 (×9): qty 1

## 2022-11-05 NOTE — ED Provider Notes (Signed)
[place] EMERGENCY DEPARTMENT AT [facility] Provider Note   CSN: [id number] Arrival date & time: 11/05/22  4098     History  Chief Complaint  Patient presents with   Weakness    Courtney Gray is a 52 y.o. female.  With past medical history of hypertension, end-stage renal disease on hemodialysis, NSTEMI, obesity, prolonged QT, diabetes who presents to the emergency department with weakness.  States she feels generally weak.  States she also feels dehydrated.  She notes that she went to dialysis on Monday and received her full treatment.  States that she then ate some [employer] later in the evening and then on Tuesday morning woke up with some abdominal discomfort.  She states she thought it was may be related to the food that she ate.  She states that she had 4 bowel movements yesterday and has had ongoing generalized abdominal discomfort.  She denies having any nausea or vomiting.  Does note that she feels very mildly short of breath but is denying chest pain or palpitations.  She denies any cough or fevers although she states her throat feels scratchy.  Does not make urine.  Additionally she notes that about 2 weeks ago she was feeling unwell when doctors office realized that her oxygen tank was empty.  She wears 3 to 4 L at home.  States that this was replaced.  She is unsure if this is what is calling seeing her to feel weak.  Does not use CPAP at night.   Weakness Associated symptoms: abdominal pain, diarrhea and shortness of breath        Home Medications Prior to Admission medications   Medication Sig Start Date End Date Taking? Authorizing Provider  aspirin EC 81 MG tablet Take 1 tablet (81 mg total) by mouth daily. Swallow whole. 05/16/22  Yes [place], Eric J, DO  atorvastatin (LIPITOR) 10 MG tablet Take 1 tablet (10 mg total) by mouth daily. Patient taking differently: Take 10 mg by mouth at bedtime. 11/28/20  Yes Shon Hale, MD  Darbepoetin Alfa (ARANESP) 200  MCG/0.4ML SOSY injection Inject 0.4 mLs (200 mcg total) into the skin every Tuesday at 6 PM. 05/20/22  Yes [place], Alvira Philips, DO  dicyclomine (BENTYL) 10 MG capsule Take 10 mg by mouth 3 (three) times daily. 11/04/22  Yes [provider]  docusate sodium (COLACE) 100 MG capsule Take 100 mg by mouth 2 (two) times daily.   Yes [provider]  famotidine (PEPCID) 20 MG tablet Take 1 tablet (20 mg total) by mouth daily. 05/16/22  Yes [place], Eric J, DO  gabapentin (NEURONTIN) 100 MG capsule Take 200 mg by mouth daily.   Yes [provider]  levothyroxine (SYNTHROID) 100 MCG tablet Take 100 mcg by mouth daily.   Yes [provider]  lidocaine (LIDODERM) 5 % Place 1 patch onto the skin daily. Remove & Discard patch within 12 hours or as directed by MD 09/09/22  Yes Rolly Salter, MD  LOKELMA 10 g PACK packet Take 10 g by mouth daily. 10/09/22  Yes [provider]  midodrine (PROAMATINE) 10 MG tablet Take 1 tablet (10 mg total) by mouth every Monday, Wednesday, and Friday at 8 PM. Before hemodialysis 05/16/22  Yes [place], Eric J, DO  nystatin (MYCOSTATIN/NYSTOP) powder Apply 1 Application topically 3 (three) times daily.   Yes [provider]  oxyCODONE-acetaminophen (PERCOCET/ROXICET) 5-325 MG tablet Take 1 tablet by mouth 4 (four) times daily as needed. 10/30/22  Yes [provider]  pantoprazole (PROTONIX) 40 MG tablet Take 1 tablet (40 mg total) by mouth daily. 11/29/20  Yes Shon Hale, MD  pregabalin (LYRICA) 25 MG capsule Take 25 mg by mouth every Monday, Wednesday, and Friday. After HD   Yes [provider]  pregabalin (LYRICA) 50 MG capsule Take 50 mg by mouth daily.   Yes [provider]  simethicone (MYLICON) 80 MG chewable tablet Chew 80 mg by mouth every 6 (six) hours as needed for flatulence.   Yes [provider]  sucroferric oxyhydroxide (VELPHORO) 500 MG chewable tablet Chew 500 mg by mouth 3 (three) times  daily with meals.   Yes [provider]  acetaminophen (TYLENOL) 500 MG tablet Take 1,000 mg by mouth as needed for moderate pain. Patient not taking: Reported on 11/05/2022    [provider]  cyclobenzaprine (FLEXERIL) 10 MG tablet Take 1 tablet (10 mg total) by mouth 2 (two) times daily as needed for muscle spasms. 09/16/22   Netta Corrigan, PA-C  diphenhydrAMINE (BENADRYL) 50 MG capsule Take 50 mg by mouth at bedtime as needed for sleep.    [provider]  lactulose (CHRONULAC) 10 GM/15ML solution Take 45 mLs (30 g total) by mouth 2 (two) times daily as needed for mild constipation. Patient not taking: Reported on 11/05/2022 05/15/22   [place], Alvira Philips, DO  loratadine (CLARITIN) 10 MG tablet Take 1 tablet (10 mg total) by mouth daily as needed for allergies. Patient not taking: Reported on 11/05/2022 05/15/22   [place], Alvira Philips, DO  nitroGLYCERIN (NITROSTAT) 0.4 MG SL tablet Place 1 tablet (0.4 mg total) under the tongue every 5 (five) minutes as needed for chest pain. Patient not taking: Reported on 11/05/2022 05/24/21   [place], Alvira Philips, DO  omeprazole (PRILOSEC) 40 MG capsule Take 40 mg by mouth daily. Patient not taking: Reported on 11/05/2022    [provider]  oxycodone (OXY-IR) 5 MG capsule Take 1 capsule (5 mg total) by mouth every 4 (four) hours as needed (for moderaet pain). Patient not taking: Reported on 11/05/2022 09/09/22   Rolly Salter, MD  OXYGEN Inhale 4 L into the lungs See admin instructions. Oxygen at 4L hr via nasal cannula and will use portable oxygen machine when up in wheelchair. 10/28/21   [provider]  polyethylene glycol (MIRALAX / GLYCOLAX) 17 g packet Take 17 g by mouth daily as needed for mild constipation. Patient not taking: Reported on 11/05/2022 05/15/22   [place], Alvira Philips, DO  polyvinyl alcohol (LIQUIFILM TEARS) 1.4 % ophthalmic solution Place 1 drop into both eyes as needed for dry eyes. Patient not taking: Reported on  11/05/2022 05/15/22   [place], Eric J, DO  sodium chloride 0.9 % SOLN 100 mL with sodium thiosulfate 250 MG/ML SOLN 12.5 g Inject 12.5 g into the vein every Monday, Wednesday, and Friday with hemodialysis. Patient not taking: Reported on 11/05/2022 05/16/22   [place], Eric J, DO      Allergies    Benzonatate, Hydralazine, Amoxicillin, Clonidine, Chlorhexidine, and Morphine    Review of Systems   Review of Systems  Constitutional:  Positive for fatigue.  Respiratory:  Positive for shortness of breath.   Gastrointestinal:  Positive for abdominal pain and diarrhea.  Neurological:  Positive for weakness.  All other systems reviewed and are negative.   Physical Exam Updated Vital Signs BP (!) 122/49   Pulse 78   Temp 98.3 F (36.8 C) (Oral)   Resp (!) 22  SpO2 94%  Physical Exam Vitals and nursing note reviewed.  Constitutional:      General: She is not in acute distress.    Appearance: She is ill-appearing. She is not toxic-appearing.     Comments: Chronically ill-appearing  HENT:     Head: Normocephalic.     Mouth/Throat:     Mouth: Mucous membranes are dry.     Pharynx: Oropharynx is clear.  Eyes:     General: No scleral icterus.    Extraocular Movements: Extraocular movements intact.     Pupils: Pupils are equal, round, and reactive to light.  Cardiovascular:     Rate and Rhythm: Normal rate and regular rhythm.     Pulses: Normal pulses.  Pulmonary:     Effort: Pulmonary effort is normal. No respiratory distress.     Breath sounds: Normal breath sounds.     Comments: On 3 L nasal cannula Abdominal:     General: Bowel sounds are normal. There is no distension.     Palpations: Abdomen is soft.     Tenderness: There is no abdominal tenderness.  Musculoskeletal:     Comments: Bilateral AKA  Skin:    General: Skin is warm and dry.     Capillary Refill: Capillary refill takes less than 2 seconds.  Neurological:     General: No focal deficit present.     Mental Status:  She is alert and oriented to person, place, and time.  Psychiatric:        Mood and Affect: Mood normal.        Behavior: Behavior normal.     ED Results / Procedures / Treatments   Labs (all labs ordered are listed, but only abnormal results are displayed) Labs Reviewed  COMPREHENSIVE METABOLIC PANEL - Abnormal; Notable for the following components:      Result Value   Potassium 5.3 (*)    Chloride 94 (*)    BUN 55 (*)    Creatinine, Ser 9.93 (*)    GFR, Estimated 4 (*)    Anion gap 20 (*)    All other components within normal limits  CBC WITH DIFFERENTIAL/PLATELET - Abnormal; Notable for the following components:   RBC 3.83 (*)    Hemoglobin 11.2 (*)    RDW 15.9 (*)    Platelets 96 (*)    All other components within normal limits  BRAIN NATRIURETIC PEPTIDE - Abnormal; Notable for the following components:   B Natriuretic Peptide 1,832.9 (*)    All other components within normal limits  I-STAT VENOUS BLOOD GAS, ED - Abnormal; Notable for the following components:   pO2, Ven 46 (*)    Potassium 5.3 (*)    Calcium, Ion 1.05 (*)    All other components within normal limits  CBG MONITORING, ED - Abnormal; Notable for the following components:   Glucose-Capillary 69 (*)    All other components within normal limits  TROPONIN I (HIGH SENSITIVITY) - Abnormal; Notable for the following components:   Troponin I (High Sensitivity) 242 (*)    All other components within normal limits  RESP PANEL BY RT-PCR (RSV, FLU A&B, COVID)  RVPGX2  LIPASE, BLOOD  CBG MONITORING, ED  TROPONIN I (HIGH SENSITIVITY)    EKG EKG Interpretation Date/Time:  Wednesday November 05 2022 10:58:50 EDT Ventricular Rate:  87 PR Interval:  162 QRS Duration:  139 QT Interval:  425 QTC Calculation: 512 R Axis:   62  Text Interpretation: Normal sinus rhythm Nonspecific intraventricular conduction  delay Nonspecific T abnormalities, lateral leads Confirmed by Estanislado Pandy [phone]) on 11/05/2022 1:01:59  PM  Radiology DG Chest Port 1 View  Result Date: 11/05/2022 CLINICAL DATA:  CHF EXAM: PORTABLE CHEST 1 VIEW COMPARISON:  09/08/2022 FINDINGS: Enlarged cardiopericardial silhouette. Calcified aorta. Vascular congestion. No pneumothorax, effusion. No edema. Overlapping cardiac leads. Left IJ double-lumen catheter with tip along the right side of the heart. IMPRESSION: Enlarged cardiopericardial silhouette with vascular congestion. Stable large bore left IJ catheter Electronically Signed   By: Karen Kays M.D.   On: 11/05/2022 11:53    Procedures Procedures   Medications Ordered in ED Medications - No data to display  ED Course/ Medical Decision Making/ A&P Clinical Course as of 11/05/22 1430  Wed Nov 05, 2022  1108 Glucose 69 - instructed RN to provide juice and will recheck   [LA]    Clinical Course User Index [LA] Cristopher Peru, PA-C                                 Medical Decision Making Amount and/or Complexity of Data Reviewed Labs: ordered. Radiology: ordered.  Risk Decision regarding hospitalization.  Initial Impression and Ddx 52 year old female who presents to the emergency department with weakness Patient PMH that increases complexity of ED encounter: Hypertension, diabetes, end-stage renal disease on hemodialysis, NSTEMI, obesity, prolonged QT Differential: Broad differential for weakness but do include electrolyte dysfunction, dehydration, infection, etc.  Interpretation of Diagnostics I independent reviewed and interpreted the labs as followed: Troponin 242, BNP 1800, potassium of 5.3, creatinine 9.9  - I independently visualized the following imaging with scope of interpretation limited to determining acute life threatening conditions related to emergency care: Chest x-ray, which revealed vascular congestion  Patient Reassessment and Ultimate Disposition/Management 52 year old female who presents to the emergency department with fatigue and weakness.  Overall  she is chronically ill-appearing.  She is on 2 to 3 L nasal cannula here in the emergency department satting 96%.  She does have some base rales.  Generally just feels uncomfortable.  Given that she is end-stage renal disease, CHF will get labs including a venous gas, troponin, BNP, chest x-ray.  1108: Glucose was 69, patient provided with juice. 1220: Glucose recheck is 82.  Also providing patient with more juice at this time. 1240: Initial troponin is 242, increased from previous.  The BNP is also elevated at 1800.  Consulting cardiology.  Think that this is likely demand as she does appear to be fluid volume overloaded on chest x-ray.  EKG without significant changes from previous. 1330: Consulted with cardiology who will evaluate patient. 1425: spoke with Dr. Glenna Fellows, nephrology who will place dialysis orders/follow patient.  1429: Spoke with Dr. Thomasene Ripple, internal medicine team who will admit the patient.  Patient will need admission for fluid volume overload secondary to her end-stage renal disease which is likely exacerbating her CHF.  Will need dialysis for fluid removal and electrolyte correction.  Nephrology did not have any recommendation about correcting her hyperkalemia at this time.  Patient is agreeable to plan.  Do not think that her fatigue is related to underlying infection.  Patient management required discussion with the following services or consulting groups:  Hospitalist Service, Cardiology, and Nephrology  Complexity of Problems Addressed Chronic illness with severe exacerbation  Additional Data Reviewed and Analyzed Further history obtained from: EMS on arrival, Past medical history and medications listed in the EMR, Prior ED visit notes,  and Care Everywhere  Patient Encounter Risk Assessment Minor Procedures, SDOH impact on management, and Consideration of hospitalization  Final Clinical Impression(s) / ED Diagnoses Final diagnoses:  Generalized weakness   Hypervolemia, unspecified hypervolemia type    Rx / DC Orders ED Discharge Orders     None         Cristopher Peru, PA-C 11/05/22 1431    Coral Spikes, DO 11/05/22 1637

## 2022-11-05 NOTE — ED Notes (Signed)
Pt requested to take her gout medication/ pt is not sure of the name and

## 2022-11-05 NOTE — ED Notes (Signed)
Called dialysis/ no answer/ attempted to let hospitalist know-no answer

## 2022-11-05 NOTE — Discharge Summary (Shared)
Name: Courtney Gray MRN: [id number] DOB: [birth year] 52 y.o. PCP: Courtney Peri, MD  Date of Admission: 11/05/2022  9:52 AM Date of Discharge: No discharge date for patient encounter. Attending Physician: Courtney Rung, DO  Discharge Diagnosis: 1. Principal Problem:   Acute exacerbation of CHF (congestive heart failure) (HCC) Active Problems:   ESRD on dialysis (HCC)   Chronic respiratory failure with hypoxia (HCC)   Elevated troponin level not due to acute coronary syndrome   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HCC)   Acute on chronic combined systolic and diastolic CHF (congestive heart failure) (HCC)   Generalized weakness   Elevated troponin   Severe mitral regurgitation   Pressure injury of right ischium, stage 4 (HCC)   Right atrial mass  Discharge Medications: Allergies as of 11/12/2022       Reactions   Benzonatate Other (See Comments)   Hallucinations   Hydralazine Other (See Comments)   Hallucinations   Amoxicillin Itching, Nausea And Vomiting   Clonidine Other (See Comments)   Altered mental state, insomnia    Chlorhexidine Itching   Patient reports it is the CHG baths that cause her itching not the CHG used for caring for her IV/HD access.     Morphine Itching     Med Rec must be completed prior to using this [facility]***       Disposition and follow-up:   Courtney Gray was discharged from [facility] in [place] condition.  At the hospital follow up visit please address:  1.  [ ]  PCP: Acute on chronic heart failure exacerbation with reduced EF. Plan to medically managed. Consider sleep study for concern of OSA. Found to have low hepatitis B titers. May consider HBV 2-dose series vaccine for dialysis. Discharged with senna 2 tablets for constipation. States Miralax does not work       2.  [ ]  Cardiology: Found to have newly reduced EF 30-35%, LV global hypokinesis, severe MR, and severe right atrial wall calcification extending  into the right atrial cavity. She is not a candidate for valve repair and RHC is contraindicated. Hold off on LHC given lack of anginal symptoms. Plan to medical manage with dialysis. She is not on any home GDMT    Follow-up Appointments:  Follow-up Information     Courtney Dory, PA-C Follow up on 11/28/2022.   Specialty: Cardiology Why: at 8:25am for cardiology follow up appointment Contact information: [address] [place] [address] [phone]                 Hospital Course by problem list:  Courtney Gray is a 52 y.o. female with a PMHx of HTN, ESRD on iHD, NSTEMI, obesity and DM who presents with generalized weakness and fatigue concerning for acute on chronic heart failure exacerbation found to newly reduced EF (30-35%)  Active Problems   #Acute on chronic heart failure exacerbation with newly reduced EF (EF 30-35%) #Elevated troponins  H/o NSTEMI  #Severe RA calcification   TEE showed newly reduced EF at 30-35% with LVH, severe MR, mild-mod TR, and initial echodense area at left atria possible c/f mass. Last ECHO in 09/27/2021 showed EF 65-70% and noted a calcified right atrial mass with unclear etiology, possible calcified thrombus. A follow-up TEE was performed that denied any evidence of left atrial mass or thrombus but noted right atrial calcification extending from the wall into the cavity. GDMT management difficult in setting of dialysis and worsening heart failure.  She is a poor surgical candidate. RHC is contraindicated and LHC not indicated in setting of normal EKG and no anginal symptoms. Plan to medically manage with dialysis.      #ESRD  #H/o chronic anemia #Hyperphosphatemia Dialysis schedule MWF. Found to have low immunity to Hepatitis B. May consider HBV vaccine. Hb stable. Continue midodrine 10mg  during dialysis days. Estimated dry weight at discharge was 117.9 kg    #Insomnia  #Fatigue Multiple possible etiologies due to chronic  conditions. Newly reduced EF likely contributor for recent changes. Recommended outpatient sleep study for OSA    #Abdominal discomfort #H/o calciphylaxis Endorsed bilateral abdominal discomfort. Previously had ulcer in her left anterior abdominal wall. Recent hospital admission on 05/2022 noted open wound right flank was clean and dry and open left lower abdominal wall would that was mostly chronic and managed with dressings. 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 Dressing in place.    #Chronic back pain States lidocaine patch does not relieve pain. Per chart review, she has been previously trialed on fentanyl patch. Discharged with fentanyl patch.    #Constipation:  BM following suppository. Discharged with senna 2 tabs    Chronic Problems   #Hypothyroidism: Cont home levothyroxine 100 mcg p.o. daily   #T2DM: A1C 5.1 on June 2024. Diet controlled   #HLD: LDL 23 in 08/2021. Cont home atorvastatin 10 mg daily    #GERD: Cont home Protonix 40mg  daily    #Phantom leg pain: Cont gabapentin 200mg  daily and Lyrica 50mg  daily    #HTN: Previously prescribed ACEi. Not currently taking any home hypertensives. BP stable    #Chronic respiratory failure: Uses 3-4L oxygen at home as needed. Has needed 4L oxygen nasal cannula during admission    #Peripheral artery disease: s/p bilateral AKA. Managed with ASA and atorvastatin as listed above    Discharge Exam:   BP (!) 70/45   Pulse 80   Temp 97.9 F (36.6 C)   Resp (!) 7   Ht 5\' 5"  (1.651 m)   Wt 117.9 kg   SpO2 100%   BMI 43.25 kg/m  Discharge exam: *** General appearance: awake, alert, NAD   Head: Normocephalic, without obvious abnormality, atraumatic Lungs: CTAB, normal WOB, on 4L O2 nasal cannula   Heart: Systolic murmur near dialysis access, S1, S2 normal  Abdomen:  soft, NTND, no guarding  Extremities:  bilateral above the knee amputations   Pertinent Labs, Studies, and Procedures:   Lab Results  Component Value Date   WBC 5.6 11/12/2022   HGB 9.8 (L) 11/12/2022   HCT 31.8 (L) 11/12/2022   PLT 118 (L) 11/12/2022   Lab Results  Component Value Date   NA 135 11/12/2022   K 4.5 11/12/2022   CL 95 (L) 11/12/2022   CO2 21 (L) 11/12/2022   BUN 43 (H) 11/12/2022   CREATININE 5.96 (H) 11/12/2022   CALCIUM 9.0 11/12/2022   MG 1.9 11/11/2022   PHOS 5.8 (H) 11/12/2022   Lab Results  Component Value Date   ALKPHOS 80 11/05/2022   BILITOT 0.8 11/05/2022   BILIDIR 0.2 09/14/2021   PROT 8.1 11/05/2022   ALBUMIN 3.3 (L) 11/12/2022   ALT 21 11/05/2022   AST 23 11/05/2022   Lab Results  Component Value Date   INR 1.1 09/09/2022   APTT 65 (H) 10/02/2021   ECHO Complete  DG Chest Port 1 View  Result Date: 11/05/2022 CLINICAL DATA:  CHF EXAM: PORTABLE CHEST 1 VIEW COMPARISON:  09/08/2022 FINDINGS: Enlarged cardiopericardial silhouette. Calcified aorta. Vascular congestion. No pneumothorax, effusion. No edema. Overlapping cardiac leads. Left IJ double-lumen catheter with tip along the right side of the heart. IMPRESSION: Enlarged cardiopericardial silhouette with vascular congestion. Stable large bore left IJ catheter Electronically Signed   By: Karen Kays M.D.   On: 11/05/2022 11:53   Discharge Instructions:    Signed: Lona Millard, Medical Student 11/12/2022, 12:30 PM   Pager: @[email or web address] @

## 2022-11-05 NOTE — ED Notes (Signed)
Juice provided and pt requesting pain medication will notify MD

## 2022-11-05 NOTE — Consult Note (Signed)
CARDIOLOGY CONSULT NOTE       Patient ID: Courtney Gray MRN: [id number] DOB/AGE: April 29, 1970 52 y.o.  Admit date: 11/05/2022 Referring Physician: Georgann Housekeeper Primary Physician: Kirstie Peri, MD Primary Cardiologist: Croitoru Reason for Consultation: Elevated troponin  Active Problems:   * No active hospital problems. *   HPI:  52 y.o. chronically ill with DM, CRF on dialysis, bilateral AKA, Bacteremia in June 2023 with TEE ? Calcified thrombus on dialysis catheter She is being admitted with "weakness"  She has no chest pain Troponin 242-> 239. ECG has no acute changes in NSR BNP chronically elevated 1800 range today. Prior anemia improved 36.5 .  EF normal on TEE 09/27/21.  CXR with enlarged cardiac silhouette and vascular congestion. She has chronic hypoxic respiratory failure on 2L [place] at home   ROS All other systems reviewed and negative except as noted above  Past Medical History:  Diagnosis Date   Arthritis    Bacteremia    Diabetes mellitus without complication (HCC)    Dialysis complication, subsequent encounter    ESRD (end stage renal disease) (HCC)    Gout    High cholesterol    Hypertension    Hypertensive emergency    Ischemic foot pain at rest 08/03/2021   Renal disorder     Family History  Problem Relation Age of Onset   Diabetes Mother    Diabetes Father     Social History   Socioeconomic History   Marital status: Single    Spouse name: Not on file   Number of children: Not on file   Years of education: Not on file   Highest education level: Not on file  Occupational History   Not on file  Tobacco Use   Smoking status: Never   Smokeless tobacco: Never  Vaping Use   Vaping status: Never Used  Substance and Sexual Activity   Alcohol use: Never   Drug use: Never   Sexual activity: Not on file  Other Topics Concern   Not on file  Social History Narrative   Not on file   Social Determinants of Health   Financial Resource Strain: High  Risk (03/27/2018)   [facility]   Overall Financial Resource Strain (CARDIA)    Difficulty of Paying Living Expenses: Very hard  Food Insecurity: No Food Insecurity (12/11/2021)   Hunger Vital Sign    Worried About Running Out of Food in the Last Year: Never true    Ran Out of Food in the Last Year: Never true  Transportation Needs: No Transportation Needs (12/11/2021)   PRAPARE - Administrator, Civil Service (Medical): No    Lack of Transportation (Non-Medical): No  Physical Activity: Not on file  Stress: Not on file  Social Connections: Unknown (08/20/2021)   Received from [facility]   Social Network    Social Network: Not on file  Intimate Partner Violence: Not At Risk (12/11/2021)   Humiliation, Afraid, Rape, and Kick questionnaire    Fear of Current or Ex-Partner: No    Emotionally Abused: No    Physically Abused: No    Sexually Abused: No    Past Surgical History:  Procedure Laterality Date   ABDOMINAL AORTOGRAM W/LOWER EXTREMITY N/A 08/07/2021   Procedure: ABDOMINAL AORTOGRAM W/LOWER EXTREMITY;  Surgeon: Victorino Sparrow, MD;  Location: [facility] INVASIVE CV LAB;  Service: Cardiovascular;  Laterality: N/A;   AMPUTATION Right 08/14/2021   Procedure: AMPUTATION ABOVE KNEE;  Surgeon: Cephus Shelling, MD;  Location: [name] OR;  Service: Vascular;  Laterality: Right;   AMPUTATION Left 09/23/2021   Procedure: AMPUTATION MIDDLE FINGER;  Surgeon: Marlyne Beards, MD;  Location: [facility] OR;  Service: Orthopedics;  Laterality: Left;   AMPUTATION Left 10/09/2021   Procedure: LEFT ABOVE KNEE AMPUTATION;  Surgeon: Leonie Douglas, MD;  Location: [facility] OR;  Service: Vascular;  Laterality: Left;   AV FISTULA PLACEMENT     x2, unsuccessful.    BUBBLE STUDY  09/27/2021   Procedure: BUBBLE STUDY;  Surgeon: Lewayne Bunting, MD;  Location: [facility] ENDOSCOPY;  Service: Cardiovascular;;   IR FLUORO GUIDE CV LINE LEFT  12/14/2020   IR FLUORO GUIDE CV LINE  LEFT  02/20/2021   IR FLUORO GUIDE CV LINE LEFT  09/11/2021   IR FLUORO GUIDE CV LINE LEFT  10/18/2021   IR FLUORO GUIDE CV LINE LEFT  11/26/2021   IR FLUORO GUIDE CV LINE LEFT  11/28/2021   IR FLUORO GUIDE CV LINE LEFT  02/04/2022   IR FLUORO GUIDE CV LINE LEFT  03/07/2022   IR FLUORO GUIDE CV LINE RIGHT  08/28/2020   IR FLUORO GUIDE CV LINE RIGHT  05/13/2021   IR FLUORO GUIDE CV LINE RIGHT  05/24/2021   IR INTRAVASCULAR ULTRASOUND NON CORONARY  03/07/2022   IR PERC TUN PERIT CATH WO PORT S&I /IMAG  09/06/2020   IR PTA VENOUS EXCEPT DIALYSIS CIRCUIT  02/20/2021   IR PTA VENOUS EXCEPT DIALYSIS CIRCUIT  05/13/2021   IR PTA VENOUS EXCEPT DIALYSIS CIRCUIT  11/28/2021   IR PTA VENOUS EXCEPT DIALYSIS CIRCUIT  03/07/2022   IR REMOVAL TUN CV CATH W/O FL  05/24/2021   IR REMOVAL TUN CV CATH W/O FL  10/15/2021   IR US GUIDE VASC ACCESS LEFT  09/06/2020   IR US GUIDE VASC ACCESS LEFT  09/11/2021   IR US GUIDE VASC ACCESS LEFT  10/18/2021   IR US GUIDE VASC ACCESS RIGHT  05/24/2021   IR US GUIDE VASC ACCESS RIGHT  09/10/2021   IR VENO/JUGULAR RIGHT  09/10/2021   IR VENOCAVAGRAM SVC  02/20/2021   IR VENOCAVAGRAM SVC  11/26/2021   IR VENOCAVAGRAM SVC  03/07/2022   TEE WITHOUT CARDIOVERSION N/A 09/27/2021   Procedure: TRANSESOPHAGEAL ECHOCARDIOGRAM (TEE);  Surgeon: Lewayne Bunting, MD;  Location: [facility] ENDOSCOPY;  Service: Cardiovascular;  Laterality: N/A;   UPPER EXTREMITY VENOGRAPHY Right 12/04/2021   Procedure: UPPER EXTREMITY VENOGRAPHY;  Surgeon: Victorino Sparrow, MD;  Location: [facility] INVASIVE CV LAB;  Service: Cardiovascular;  Laterality: Right;     No current facility-administered medications for this encounter.  Current Outpatient Medications:    aspirin EC 81 MG tablet, Take 1 tablet (81 mg total) by mouth daily. Swallow whole., Disp: 30 tablet, Rfl: 12   atorvastatin (LIPITOR) 10 MG tablet, Take 1 tablet (10 mg total) by mouth daily. (Patient taking differently: Take 10 mg by mouth at bedtime.), Disp: 30 tablet, Rfl:  5   Darbepoetin Alfa (ARANESP) 200 MCG/0.4ML SOSY injection, Inject 0.4 mLs (200 mcg total) into the skin every Tuesday at 6 PM., Disp: 1.68 mL, Rfl:    dicyclomine (BENTYL) 10 MG capsule, Take 10 mg by mouth 3 (three) times daily., Disp: , Rfl:    docusate sodium (COLACE) 100 MG capsule, Take 100 mg by mouth 2 (two) times daily., Disp: , Rfl:    famotidine (PEPCID) 20 MG tablet, Take 1 tablet (20 mg total) by mouth daily., Disp: , Rfl:  gabapentin (NEURONTIN) 100 MG capsule, Take 200 mg by mouth daily., Disp: , Rfl:    levothyroxine (SYNTHROID) 100 MCG tablet, Take 100 mcg by mouth daily., Disp: , Rfl:    lidocaine (LIDODERM) 5 %, Place 1 patch onto the skin daily. Remove & Discard patch within 12 hours or as directed by MD, Disp: 30 patch, Rfl: 0   LOKELMA 10 g PACK packet, Take 10 g by mouth daily., Disp: , Rfl:    midodrine (PROAMATINE) 10 MG tablet, Take 1 tablet (10 mg total) by mouth every Monday, Wednesday, and Friday at 8 PM. Before hemodialysis, Disp: , Rfl:    nystatin (MYCOSTATIN/NYSTOP) powder, Apply 1 Application topically 3 (three) times daily., Disp: , Rfl:    oxyCODONE-acetaminophen (PERCOCET/ROXICET) 5-325 MG tablet, Take 1 tablet by mouth 4 (four) times daily as needed., Disp: , Rfl:    pantoprazole (PROTONIX) 40 MG tablet, Take 1 tablet (40 mg total) by mouth daily., Disp: 30 tablet, Rfl: 5   pregabalin (LYRICA) 25 MG capsule, Take 25 mg by mouth every Monday, Wednesday, and Friday. After HD, Disp: , Rfl:    pregabalin (LYRICA) 50 MG capsule, Take 50 mg by mouth daily., Disp: , Rfl:    simethicone (MYLICON) 80 MG chewable tablet, Chew 80 mg by mouth every 6 (six) hours as needed for flatulence., Disp: , Rfl:    sucroferric oxyhydroxide (VELPHORO) 500 MG chewable tablet, Chew 500 mg by mouth 3 (three) times daily with meals., Disp: , Rfl:    acetaminophen (TYLENOL) 500 MG tablet, Take 1,000 mg by mouth as needed for moderate pain. (Patient not taking: Reported on 11/05/2022),  Disp: , Rfl:    cyclobenzaprine (FLEXERIL) 10 MG tablet, Take 1 tablet (10 mg total) by mouth 2 (two) times daily as needed for muscle spasms., Disp: 20 tablet, Rfl: 0   diphenhydrAMINE (BENADRYL) 50 MG capsule, Take 50 mg by mouth at bedtime as needed for sleep., Disp: , Rfl:    lactulose (CHRONULAC) 10 GM/15ML solution, Take 45 mLs (30 g total) by mouth 2 (two) times daily as needed for mild constipation. (Patient not taking: Reported on 11/05/2022), Disp: 236 mL, Rfl: 0   loratadine (CLARITIN) 10 MG tablet, Take 1 tablet (10 mg total) by mouth daily as needed for allergies. (Patient not taking: Reported on 11/05/2022), Disp: , Rfl:    nitroGLYCERIN (NITROSTAT) 0.4 MG SL tablet, Place 1 tablet (0.4 mg total) under the tongue every 5 (five) minutes as needed for chest pain. (Patient not taking: Reported on 11/05/2022), Disp: 10 tablet, Rfl: 3   omeprazole (PRILOSEC) 40 MG capsule, Take 40 mg by mouth daily. (Patient not taking: Reported on 11/05/2022), Disp: , Rfl:    oxycodone (OXY-IR) 5 MG capsule, Take 1 capsule (5 mg total) by mouth every 4 (four) hours as needed (for moderaet pain). (Patient not taking: Reported on 11/05/2022), Disp: 5 capsule, Rfl: 0   OXYGEN, Inhale 4 L into the lungs See admin instructions. Oxygen at 4L hr via nasal cannula and will use portable oxygen machine when up in wheelchair., Disp: , Rfl:    polyethylene glycol (MIRALAX / GLYCOLAX) 17 g packet, Take 17 g by mouth daily as needed for mild constipation. (Patient not taking: Reported on 11/05/2022), Disp: 14 each, Rfl: 0   polyvinyl alcohol (LIQUIFILM TEARS) 1.4 % ophthalmic solution, Place 1 drop into both eyes as needed for dry eyes. (Patient not taking: Reported on 11/05/2022), Disp: 15 mL, Rfl: 0   sodium chloride 0.9 % SOLN  100 mL with sodium thiosulfate 250 MG/ML SOLN 12.5 g, Inject 12.5 g into the vein every Monday, Wednesday, and Friday with hemodialysis. (Patient not taking: Reported on 11/05/2022), Disp: , Rfl:      Physical Exam: Blood pressure (!) 122/49, pulse 78, temperature 98.3 F (36.8 C), temperature source Oral, resp. rate (!) 22, SpO2 94%.    Obese chronically ill black female Basilar crackles No murmur Abdomen benign S/P bilateral AKA Dialysis catheter left subclavian  Labs:   Lab Results  Component Value Date   WBC 6.7 11/05/2022   HGB 12.2 11/05/2022   HCT 36.0 11/05/2022   MCV 95.3 11/05/2022   PLT 96 (L) 11/05/2022    Recent Labs  Lab 11/05/22 1041 11/05/22 1155  NA 137 135  K 5.3* 5.3*  CL 94*  --   CO2 23  --   BUN 55*  --   CREATININE 9.93*  --   CALCIUM 9.4  --   PROT 8.1  --   BILITOT 0.8  --   ALKPHOS 80  --   ALT 21  --   AST 23  --   GLUCOSE 87  --    Lab Results  Component Value Date   CKTOTAL 131 08/03/2021    Lab Results  Component Value Date   CHOL 72 08/08/2021   CHOL 107 05/18/2021   Lab Results  Component Value Date   HDL 26 (L) 08/08/2021   HDL 23 (L) 05/18/2021   Lab Results  Component Value Date   LDLCALC 23 08/08/2021   LDLCALC 40 05/18/2021   Lab Results  Component Value Date   TRIG 114 08/08/2021   TRIG 222 (H) 05/18/2021   Lab Results  Component Value Date   CHOLHDL 2.8 08/08/2021   CHOLHDL 4.7 05/18/2021   No results found for: "LDLDIRECT"    Radiology: [facility] Chest Port 1 View  Result Date: 11/05/2022 CLINICAL DATA:  CHF EXAM: PORTABLE CHEST 1 VIEW COMPARISON:  09/08/2022 FINDINGS: Enlarged cardiopericardial silhouette. Calcified aorta. Vascular congestion. No pneumothorax, effusion. No edema. Overlapping cardiac leads. Left IJ double-lumen catheter with tip along the right side of the heart. IMPRESSION: Enlarged cardiopericardial silhouette with vascular congestion. Stable large bore left IJ catheter Electronically Signed   By: Karen Kays M.D.   On: 11/05/2022 11:53    EKG: SR ICLBBB no acute changes    ASSESSMENT AND PLAN:   Elevated Troponin:  in setting of chronic renal failure Been elevated in past.  No acute ECG changes no chest pain She is not a candidate for non invasive risk stratification and no indication for aggressive invasive catheterization. Trend labs and observe TTE to make sure EF still normal Chronic HFpEF:  in setting of obesity, HTN, DM chronic respiratory failure poor functional status due to bilateral AKA. Elevated BNP Volume controlled with dialysis  CRF:  Anemia improved. ? Had issue of calcified thrombus on dialysis catheter ever been addressed. As far as nephrology PA/patient know catheter has never been switched out Consider blood cultures   Other than Echocardiogram no other cardiovascular recommendations at this time  Signed: Charlton Haws 11/05/2022, 3:07 PM

## 2022-11-05 NOTE — ED Triage Notes (Signed)
TRIAGE NOTE  Patient arrives by EMS from [facility] with c/o generalized weakness and reports feels like she is dehydrated.  Patient is dialysis patient MWF, was due for dialysis today.   Patient reports yesterday had 4 BM and reports some diarrhea.  Patient reports dry mouth.  Patient on 2L [place] at baseline for CHF and COPD.    EMS VITALS:   BP 150 83 CBG 97

## 2022-11-05 NOTE — Hospital Course (Addendum)
[ ]  PCP: [ ]  Cards: [ ]  Nephro:   Courtney Gray is a 52 y.o. female with a PMHx of HTN, ESRD on iHD, NSTEMI, obesity and DM who presents with generalized weakness and fatigue concerning for acute on chronic heart failure exacerbation found to newly reduced EF (30-35%)   She reports yesterday her stomach was hurting and initially thought it was related to some food she had eaten. She later had four bowel movements, two that were watery, non-bloody diarrhea. When she tried to sleep she later woke up and felt that she didn't have any strength. Her daughter brought her some pedialyte but it did not help. She was supposed to go to dialysis today but was unable too. Her dialysis schedule is MWF and has been on dialysis for 7 years. She reports having calcium deposits at both sides of her abdomen. She continues to endorse abdominal discomfort   Active Problems   #Acute on chronic heart failure exacerbation with newly reduced EF (EF 30-35%) #Elevated troponins  H/o NSTEMI  #Echodense area c/f left atrial mass  TEE showed newly reduced EF at 30-35% with LVH, severe MR, mild-mod TR, and echodense area at left atria possible c/f mass. Last ECHO in 09/27/2021 showed EF 65-70% and noted a calcified right atrial mass with unclear etiology, possible calcified thrombus. Continues to require 3L nasal cannula but denies SOB. GDMT management difficult iso dialysis and worsening heart failure. Continue to manage volume with dialysis     -Cardiology consulted, appreciate recs, may consider adding a beta-blocker on non-dialysis days  -Cont bASA  -Daily BMP, CBC  -Dialysis as stated above  -Continue nasal cannula, wean O2 as tolerated  -Plan for RHC/LHC on 8/5 -Plan for TEE on 8/6  #ESRD  #Hyperkalemia, resolved  #H/o chronic anemia #Hyperphosphatemia Dialysis schedule MWF but went for dialysis yesterday on Thursday 8/1. She wished to resume dialysis on Saturday. Found to have low immunity to Hepatitis B. May  consider HBV vaccine.     -Nephrology consulted, appreciate recs, resume dialysis on 8/3 -Consider HBV vaccine for dialysis  -Cont midodrine 10mg  during dialysis days  -Follow blood cultures - no growth at 48 hrs      #Insomnia  #Fatigue Multiple possible etiologies due to chronic conditions. Newly reduced EF likely contributor for recent changes.    -Recommended outpatient sleep study for OSA  -Melatonin 5mg     #Abdominal discomfort #H/o calciphylaxis Endorses bilateral abdominal discomfort. Previously had ulcer in her left anterior abdominal wall. Recent hospital admission on 05/2022 noted open wound right flank was clean and dry and open left lower abdominal wall would that was mostly chronic and managed with dressings. 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    -WOCN consulted, appreciate recs, cleaned with normal saline -Ischial dressing in place    Chronic Problems   #Chronic back pain: Start lidocaine patch. Oxycodone and Flexeril PRN    #Hypothyroidism: Cont home levothyroxine 100 mcg p.o. daily   #T2DM: A1C 5.1 on June 2024. Diet controlled   #HLD: LDL 23 in 08/2021. Cont home atorvastatin 10 mg daily    #GERD: Cont home Protonix 40mg  daily    #Phantom leg pain: Takes Gabapentin and Lyrica at home. Will continue home gabapentin 200mg  daily and Lyrica 50mg  daily    #  HTN: Previously prescribed ACEi. Not currently taking any home hypertensives. BP stable    #Chronic respiratory failure: uses 3-4L oxygen at home as needed. Currently on nasal cannula. Wean O2 as tolerated    #Peripheral artery disease: s/p bilateral AKA. Managed with ASA and atorvastatin as listed above     #Constipation: Miralax PRN

## 2022-11-05 NOTE — Consult Note (Signed)
[facility] Renal Consultation Note  Indication for Consultation:  Management of ESRD/hemodialysis; anemia, hypertension/volume and secondary hyperparathyroidism  HPI: Courtney Gray is a 52 y.o. female  [facility] ,with ESRD on Chronic HD (MWF  [facility]) , Morbid Obesity ,Bilat AKA,  and Past med Hx as below  presnts in Er co progressive "weakness" since Tuesday am . Uneventful HD  Monday and ate some [employer] food that night  woke up Tuesday with abd discomfort, 4 loose BMs , and progressive weakness. Denies SOB , cp , N.V. Fevers, chills . Wears chronic O2  3to 4 L [place]  at NH.   In ER =  CXR =Enlarged cardiopericardial silhouette with vascular congestion.   O2 sat  94-100 %  3l [place]  O2 K 5.3 , NA 135, bun 55 cr 9.93   Ca 9.4  hgb 11.2  wbc 6.7  plt 96 . Lipase 36,  BNP 1,832.9  ( Card noted "chronically elavated") Troponin 239       Past Medical History:  Diagnosis Date   Arthritis    Bacteremia    Diabetes mellitus without complication (HCC)    Dialysis complication, subsequent encounter    ESRD (end stage renal disease) (HCC)    Gout    High cholesterol    Hypertension    Hypertensive emergency    Ischemic foot pain at rest 08/03/2021   Renal disorder     Past Surgical History:  Procedure Laterality Date   ABDOMINAL AORTOGRAM W/LOWER EXTREMITY N/A 08/07/2021   Procedure: ABDOMINAL AORTOGRAM W/LOWER EXTREMITY;  Surgeon: Victorino Sparrow, MD;  Location[facility] INVASIVE CV LAB;  Service: Cardiovascular;  Laterality: N/A;   AMPUTATION Right 08/14/2021   Procedure: AMPUTATION ABOVE KNEE;  Surgeon: Cephus Shelling, MD;  Location: [facility] OR;  Service: Vascular;  Laterality: Right;   AMPUTATION Left 09/23/2021   Procedure: AMPUTATION MIDDLE FINGER;  Surgeon: Marlyne Beards, MD;  Location: [facility] OR;  Service: Orthopedics;  Laterality: Left;   AMPUTATION Left 10/09/2021   Procedure: LEFT ABOVE KNEE AMPUTATION;  Surgeon: Leonie Douglas, MD;  Location: [facility] OR;   Service: Vascular;  Laterality: Left;   AV FISTULA PLACEMENT     x2, unsuccessful.    BUBBLE STUDY  09/27/2021   Procedure: BUBBLE STUDY;  Surgeon: Lewayne Bunting, MD;  Location: [facility] ENDOSCOPY;  Service: Cardiovascular;;   IR FLUORO GUIDE CV LINE LEFT  12/14/2020   IR FLUORO GUIDE CV LINE LEFT  02/20/2021   IR FLUORO GUIDE CV LINE LEFT  09/11/2021   IR FLUORO GUIDE CV LINE LEFT  10/18/2021   IR FLUORO GUIDE CV LINE LEFT  11/26/2021   IR FLUORO GUIDE CV LINE LEFT  11/28/2021   IR FLUORO GUIDE CV LINE LEFT  02/04/2022   IR FLUORO GUIDE CV LINE LEFT  03/07/2022   IR FLUORO GUIDE CV LINE RIGHT  08/28/2020   IR FLUORO GUIDE CV LINE RIGHT  05/13/2021   IR FLUORO GUIDE CV LINE RIGHT  05/24/2021   IR INTRAVASCULAR ULTRASOUND NON CORONARY  03/07/2022   IR PERC TUN PERIT CATH WO PORT S&I /IMAG  09/06/2020   IR PTA VENOUS EXCEPT DIALYSIS CIRCUIT  02/20/2021   IR PTA VENOUS EXCEPT DIALYSIS CIRCUIT  05/13/2021   IR PTA VENOUS EXCEPT DIALYSIS CIRCUIT  11/28/2021   IR PTA VENOUS EXCEPT DIALYSIS CIRCUIT  03/07/2022   IR REMOVAL TUN CV CATH W/O FL  05/24/2021   IR REMOVAL TUN CV CATH W/O FL  10/15/2021   IR US GUIDE VASC ACCESS LEFT  09/06/2020   IR US GUIDE VASC ACCESS LEFT  09/11/2021   IR US GUIDE VASC ACCESS LEFT  10/18/2021   IR US GUIDE VASC ACCESS RIGHT  05/24/2021   IR US GUIDE VASC ACCESS RIGHT  09/10/2021   IR VENO/JUGULAR RIGHT  09/10/2021   IR VENOCAVAGRAM SVC  02/20/2021   IR VENOCAVAGRAM SVC  11/26/2021   IR VENOCAVAGRAM SVC  03/07/2022   TEE WITHOUT CARDIOVERSION N/A 09/27/2021   Procedure: TRANSESOPHAGEAL ECHOCARDIOGRAM (TEE);  Surgeon: Lewayne Bunting, MD;  Location: [facility] ENDOSCOPY;  Service: Cardiovascular;  Laterality: N/A;   UPPER EXTREMITY VENOGRAPHY Right 12/04/2021   Procedure: UPPER EXTREMITY VENOGRAPHY;  Surgeon: Victorino Sparrow, MD;  Location: [facility] INVASIVE CV LAB;  Service: Cardiovascular;  Laterality: Right;      Family History  Problem Relation Age of Onset   Diabetes Mother    Diabetes Father        reports that she has never smoked. She has never used smokeless tobacco. She reports that she does not drink alcohol and does not use drugs.   Allergies  Allergen Reactions   Benzonatate Other (See Comments)    Hallucinations   Hydralazine Other (See Comments)    Hallucinations   Amoxicillin Itching and Nausea And Vomiting   Clonidine Other (See Comments)    Altered mental state, insomnia    Chlorhexidine Itching    Patient reports it is the CHG baths that cause her itching not the CHG used for caring for her IV/HD access.     Morphine Itching    Prior to Admission medications   Medication Sig Start Date End Date Taking? Authorizing Provider  aspirin EC 81 MG tablet Take 1 tablet (81 mg total) by mouth daily. Swallow whole. 05/16/22  Yes [place], Eric J, DO  atorvastatin (LIPITOR) 10 MG tablet Take 1 tablet (10 mg total) by mouth daily. Patient taking differently: Take 10 mg by mouth at bedtime. 11/28/20  Yes Shon Hale, MD  Darbepoetin Alfa (ARANESP) 200 MCG/0.4ML SOSY injection Inject 0.4 mLs (200 mcg total) into the skin every Tuesday at 6 PM. 05/20/22  Yes [place], Alvira Philips, DO  dicyclomine (BENTYL) 10 MG capsule Take 10 mg by mouth 3 (three) times daily. 11/04/22  Yes [provider]  docusate sodium (COLACE) 100 MG capsule Take 100 mg by mouth 2 (two) times daily.   Yes [provider]  famotidine (PEPCID) 20 MG tablet Take 1 tablet (20 mg total) by mouth daily. 05/16/22  Yes [place], Eric J, DO  gabapentin (NEURONTIN) 100 MG capsule Take 200 mg by mouth daily.   Yes [provider]  levothyroxine (SYNTHROID) 100 MCG tablet Take 100 mcg by mouth daily.   Yes [provider]  lidocaine (LIDODERM) 5 % Place 1 patch onto the skin daily. Remove & Discard patch within 12 hours or as directed by MD 09/09/22  Yes Rolly Salter, MD  LOKELMA 10 g PACK packet Take 10 g by mouth daily. 10/09/22  Yes [provider]  midodrine (PROAMATINE) 10 MG  tablet Take 1 tablet (10 mg total) by mouth every Monday, Wednesday, and Friday at 8 PM. Before hemodialysis 05/16/22  Yes [place], Eric J, DO  nystatin (MYCOSTATIN/NYSTOP) powder Apply 1 Application topically 3 (three) times daily.   Yes [provider]  oxyCODONE-acetaminophen (PERCOCET/ROXICET) 5-325 MG tablet Take 1 tablet by mouth 4 (four) times daily as needed. 10/30/22  Yes [provider]  pantoprazole (PROTONIX) 40 MG tablet Take 1 tablet (40 mg total) by mouth daily. 11/29/20  Yes Shon Hale, MD  pregabalin (LYRICA) 25 MG capsule Take 25 mg by mouth every Monday, Wednesday, and Friday. After HD   Yes [provider]  pregabalin (LYRICA) 50 MG capsule Take 50 mg by mouth daily.   Yes [provider]  simethicone (MYLICON) 80 MG chewable tablet Chew 80 mg by mouth every 6 (six) hours as needed for flatulence.   Yes [provider]  sucroferric oxyhydroxide (VELPHORO) 500 MG chewable tablet Chew 500 mg by mouth 3 (three) times daily with meals.   Yes [provider]  acetaminophen (TYLENOL) 500 MG tablet Take 1,000 mg by mouth as needed for moderate pain. Patient not taking: Reported on 11/05/2022    [provider]  cyclobenzaprine (FLEXERIL) 10 MG tablet Take 1 tablet (10 mg total) by mouth 2 (two) times daily as needed for muscle spasms. 09/16/22   Netta Corrigan, PA-C  diphenhydrAMINE (BENADRYL) 50 MG capsule Take 50 mg by mouth at bedtime as needed for sleep.    [provider]  lactulose (CHRONULAC) 10 GM/15ML solution Take 45 mLs (30 g total) by mouth 2 (two) times daily as needed for mild constipation. Patient not taking: Reported on 11/05/2022 05/15/22   [place], Alvira Philips, DO  loratadine (CLARITIN) 10 MG tablet Take 1 tablet (10 mg total) by mouth daily as needed for allergies. Patient not taking: Reported on 11/05/2022 05/15/22   [place], Alvira Philips, DO  nitroGLYCERIN (NITROSTAT) 0.4 MG SL tablet Place 1 tablet (0.4 mg  total) under the tongue every 5 (five) minutes as needed for chest pain. Patient not taking: Reported on 11/05/2022 05/24/21   [place], Alvira Philips, DO  omeprazole (PRILOSEC) 40 MG capsule Take 40 mg by mouth daily. Patient not taking: Reported on 11/05/2022    [provider]  oxycodone (OXY-IR) 5 MG capsule Take 1 capsule (5 mg total) by mouth every 4 (four) hours as needed (for moderaet pain). Patient not taking: Reported on 11/05/2022 09/09/22   Rolly Salter, MD  OXYGEN Inhale 4 L into the lungs See admin instructions. Oxygen at 4L hr via nasal cannula and will use portable oxygen machine when up in wheelchair. 10/28/21   [provider]  polyethylene glycol (MIRALAX / GLYCOLAX) 17 g packet Take 17 g by mouth daily as needed for mild constipation. Patient not taking: Reported on 11/05/2022 05/15/22   [place], Alvira Philips, DO  polyvinyl alcohol (LIQUIFILM TEARS) 1.4 % ophthalmic solution Place 1 drop into both eyes as needed for dry eyes. Patient not taking: Reported on 11/05/2022 05/15/22   [place], Eric J, DO  sodium chloride 0.9 % SOLN 100 mL with sodium thiosulfate 250 MG/ML SOLN 12.5 g Inject 12.5 g into the vein every Monday, Wednesday, and Friday with hemodialysis. Patient not taking: Reported on 11/05/2022 05/16/22   [place], Eric J, DO      Results for orders placed or performed during the hospital encounter of 11/05/22 (from the past 48 hour(s))  Comprehensive metabolic panel     Status: Abnormal   Collection Time: 11/05/22 10:41 AM  Result Value Ref Range   Sodium 137 135 - 145 mmol/L   Potassium 5.3 (H) 3.5 - 5.1 mmol/L   Chloride 94 (L) 98 - 111 mmol/L   CO2 23 22 - 32 mmol/L   Glucose, Bld 87 70 - 99 mg/dL    Comment: Glucose reference range applies only to  samples taken after fasting for at least 8 hours.   BUN 55 (H) 6 - 20 mg/dL   Creatinine, Ser 8.65 (H) 0.44 - 1.00 mg/dL   Calcium 9.4 8.9 - 78.4 mg/dL   Total Protein 8.1 6.5 - 8.1 g/dL   Albumin 3.7 3.5 - 5.0 g/dL    AST 23 15 - 41 U/L   ALT 21 0 - 44 U/L   Alkaline Phosphatase 80 38 - 126 U/L   Total Bilirubin 0.8 0.3 - 1.2 mg/dL   GFR, Estimated 4 (L) >60 mL/min    Comment: (NOTE) Calculated using the CKD-EPI Creatinine Equation (2021)    Anion gap 20 (H) 5 - 15    Comment: Performed at [facility] Lab, [address], [place], [place] [address]  CBC with Differential     Status: Abnormal   Collection Time: 11/05/22 10:41 AM  Result Value Ref Range   WBC 6.7 4.0 - 10.5 K/uL   RBC 3.83 (L) 3.87 - 5.11 MIL/uL   Hemoglobin 11.2 (L) 12.0 - 15.0 g/dL   HCT 52.8 41.3 - 24.4 %   MCV 95.3 80.0 - 100.0 fL   MCH 29.2 26.0 - 34.0 pg   MCHC 30.7 30.0 - 36.0 g/dL   RDW 01.0 (H) 27.2 - 53.6 %   Platelets 96 (L) 150 - 400 K/uL    Comment: Immature Platelet Fraction may be clinically indicated, consider ordering this additional test [id number] REPEATED TO VERIFY    nRBC 0.0 0.0 - 0.2 %   Neutrophils Relative % 69 %   Neutro Abs 4.6 1.7 - 7.7 K/uL   Lymphocytes Relative 14 %   Lymphs Abs 1.0 0.7 - 4.0 K/uL   Monocytes Relative 12 %   Monocytes Absolute 0.8 0.1 - 1.0 K/uL   Eosinophils Relative 3 %   Eosinophils Absolute 0.2 0.0 - 0.5 K/uL   Basophils Relative 1 %   Basophils Absolute 0.0 0.0 - 0.1 K/uL   Immature Granulocytes 1 %   Abs Immature Granulocytes 0.03 0.00 - 0.07 K/uL    Comment: Performed at [facility], [address] [place], [place] [address]  Troponin I (High Sensitivity)     Status: Abnormal   Collection Time: 11/05/22 10:41 AM  Result Value Ref Range   Troponin I (High Sensitivity) 242 (HH) <18 ng/L    Comment: CRITICAL RESULT CALLED TO, READ BACK BY AND VERIFIED WITH M.ROBERTS,RN 1248 11/05/22 CLARK,S (NOTE) Elevated high sensitivity troponin I (hsTnI) values and significant  changes across serial measurements may suggest ACS but many other  chronic and acute conditions are known to elevate hsTnI results.  Refer to the "Links" section for chest pain algorithms and  additional  guidance. Performed at [facility], [address], [place], [place] [address]   Brain natriuretic peptide     Status: Abnormal   Collection Time: 11/05/22 10:41 AM  Result Value Ref Range   B Natriuretic Peptide 1,832.9 (H) 0.0 - 100.0 pg/mL    Comment: Performed at [facility], [address], [place], [place] [address]  Resp panel by RT-PCR (RSV, Flu A&B, Covid) Anterior Nasal Swab     Status: None   Collection Time: 11/05/22 10:41 AM   Specimen: Anterior Nasal Swab  Result Value Ref Range   SARS Coronavirus 2 by RT PCR NEGATIVE NEGATIVE   Influenza A by PCR NEGATIVE NEGATIVE   Influenza B by PCR NEGATIVE NEGATIVE    Comment: (NOTE)  The Xpert Xpress SARS-CoV-2/FLU/RSV plus assay is intended as an aid in the diagnosis of influenza from Nasopharyngeal swab specimens and should not be used as a sole basis for treatment. Nasal washings and aspirates are unacceptable for Xpert Xpress SARS-CoV-2/FLU/RSV testing.  Fact Sheet for Patients: BloggerCourse.com  Fact Sheet for Healthcare Providers: SeriousBroker.it  This test is not yet approved or cleared by the [place] FDA and has been authorized for detection and/or diagnosis of SARS-CoV-2 by FDA under an Emergency Use Authorization (EUA). This EUA will remain in effect (meaning this test can be used) for the duration of the COVID-19 declaration under Section 564(b)(1) of the Act, 21 U.S.C. section 360bbb-3(b)(1), unless the authorization is terminated or revoked.     Resp Syncytial Virus by PCR NEGATIVE NEGATIVE    Comment: (NOTE) Fact Sheet for Patients: BloggerCourse.com  Fact Sheet for Healthcare Providers: SeriousBroker.it  This test is not yet approved or cleared by the [place] FDA and has been authorized for detection and/or diagnosis of SARS-CoV-2 by FDA under an Emergency Use  Authorization (EUA). This EUA will remain in effect (meaning this test can be used) for the duration of the COVID-19 declaration under Section 564(b)(1) of the Act, 21 U.S.C. section 360bbb-3(b)(1), unless the authorization is terminated or revoked.  Performed at [facility] Lab, [address] [place], [place] [address]   Lipase, blood     Status: None   Collection Time: 11/05/22 10:41 AM  Result Value Ref Range   Lipase 36 11 - 51 U/L    Comment: Performed at [facility], [address], [place], [place] [address]  CBG monitoring, ED     Status: Abnormal   Collection Time: 11/05/22 11:02 AM  Result Value Ref Range   Glucose-Capillary 69 (L) 70 - 99 mg/dL    Comment: Glucose reference range applies only to samples taken after fasting for at least 8 hours.  I-Stat venous blood gas, ED     Status: Abnormal   Collection Time: 11/05/22 11:55 AM  Result Value Ref Range   pH, Ven 7.331 7.25 - 7.43   pCO2, Ven 44.7 44 - 60 mmHg   pO2, Ven 46 (H) 32 - 45 mmHg   Bicarbonate 23.7 20.0 - 28.0 mmol/L   TCO2 25 22 - 32 mmol/L   O2 Saturation 78 %   Acid-base deficit 2.0 0.0 - 2.0 mmol/L   Sodium 135 135 - 145 mmol/L   Potassium 5.3 (H) 3.5 - 5.1 mmol/L   Calcium, Ion 1.05 (L) 1.15 - 1.40 mmol/L   HCT 36.0 36.0 - 46.0 %   Hemoglobin 12.2 12.0 - 15.0 g/dL   Sample type VENOUS   CBG monitoring, ED     Status: None   Collection Time: 11/05/22 12:32 PM  Result Value Ref Range   Glucose-Capillary 82 70 - 99 mg/dL    Comment: Glucose reference range applies only to samples taken after fasting for at least 8 hours.  .  ROS:see hpi     Physical Exam: Vitals:   11/05/22 1330 11/05/22 1345  BP: (!) 124/53 (!) 122/49  Pulse:  78  Resp: (!) 22   Temp:    SpO2:  94%     General: Alert  Obese, chronically ill appearing Female, Pleasant, NAD  HEENT: [place], eomi, not icteric, mmm Neck: Supple, no jvd Heart: RRR, 1/6/sem, no rg Lungs: CTA , no rales, no wheezing , on 3 l [place] O2,  nonlabored  Abdomen: Morbid  obese, NABS, soft , min tender lower quad, ND, abd wall edema  mild  Extremities: Bilat AKA, no stump edema, , Skin: Dry dressing R gluteal area,  L lower  abd quad  Neuro: alert OX3, no acute focal deficits  Dialysis Access: L internal jugular TDC   dry  no dc, or erythema   OP Dialysis Orders: Center: [facility] MWF   . EDW 119,  2k, 2ca bath  Hep 5000 units, L internal jugular TDC  HD Bath Mircera 50 mcg  (last given 11/03/22) Venofer 50 mg  w wkly hd    Hectorol 4 mcg    Assessment/Plan Weakness= ? Etiology  Infxn , food poisoning , Viral illness , ck bld  cult with ho bacteremia ( has TDC )  Mild Volume ?  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

## 2022-11-05 NOTE — H&P (Addendum)
Date: 11/05/2022               Patient Name:  Courtney Gray MRN: [id number]  DOB: June 29, 1970 Age / Sex: 52 y.o., female   PCP: Kirstie Peri, MD              Medical Service: Internal Medicine Teaching Service              Attending Physician: Dr. Gust Rung, DO    First Contact: Georgann Housekeeper, MS4 Pager: [phone]  Second Contact: Dr. Monna Fam  Pager: [phone]            After Hours (After 5p/  First Contact Pager: [phone]  weekends / holidays): Second Contact Pager: [phone]   Chief Complaint: generalized weakness, fatigue   History of Present Illness:  History obtained by patient   Courtney Gray is a 52 y.o. female with a PMHx of HTN, ESRD on iHD, NSTEMI, obesity, and DM who presents with generalized weakness and fatigue concerning for acute on chronic heart failure exacerbation  She reports yesterday her stomach was hurting and initially thought it was related to some food she had eaten. She later had four bowel movements, two that were watery, non-bloody diarrhea. When she tried to sleep she later woke up and felt that she didn't have any strength. Her daughter brought her some pedialyte but it did not help. She was supposed to go to dialysis today but was unable too. Her dialysis schedule is MWF and has been on dialysis for 7 years. She reports having calcium deposits at both sides of her abdomen. She continues to endorse abdominal discomfort   She reports her breathing has changed but does not describe it as a shortness of breath.. She describe that she knows what fluid overload feels like but reports what she is feeling is different. She takes oxygen at home and will use around 3L. She's reported that in the past when clinicians have tried to check her O2 sat that it will sometimes read in the 50s-60s.  She denies any CP, SOB, orthopnea, dizziness, N/V, fevers, palpitations, and congestion.   ED: BNP 1832. Trop I 242 --> 239. Glu 69 -> 82. Hb 11.2 Cr 9.93. K 5.3.  CXR: Enlarged cardiopericardial silhouette with vascular congestion. EKG NSR   FMHx No FMHx of heart disease Mother: DM Father: DM, ESRD  SHx Lives in a facility at [facility]. 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 Required bilateral AKAs PCP: Kirstie Peri  Courtney Gray currently has decisional capacity for healthcare decision-making and is able to designate a surrogate healthcare decision maker. Ms. Tun designated healthcare decision maker(s) is/are Courtney Gray (the patient's adult child) as denoted by patient.  Meds: Current Facility-Administered Medications  Medication Dose Route Frequency Provider Last Rate Last Admin   aspirin EC tablet 81 mg  81 mg Oral Daily Nooruddin, Saad, MD       atorvastatin (LIPITOR) tablet 10 mg  10 mg Oral Daily Nooruddin, Saad, MD       cyclobenzaprine (FLEXERIL) tablet 10 mg  10 mg Oral BID PRN Nooruddin, Saad, MD       dicyclomine (BENTYL) capsule 10 mg  10 mg Oral TID Nooruddin, Saad, MD       diphenhydrAMINE (BENADRYL) capsule 50 mg  50 mg Oral QHS PRN Nooruddin, Jason Fila, MD  gabapentin (NEURONTIN) capsule 200 mg  200 mg Oral Daily Nooruddin, Saad, MD       heparin injection 5,000 Units  5,000 Units Subcutaneous Q8H Nooruddin, Jason Fila, MD       Melene Muller ON 11/06/2022] levothyroxine (SYNTHROID) tablet 100 mcg  100 mcg Oral Q0600 Knute Neu, RPH       midodrine (PROAMATINE) tablet 10 mg  10 mg Oral Q M,W,F-2000 Nooruddin, Saad, MD       oxyCODONE-acetaminophen (PERCOCET/ROXICET) 5-325 MG per tablet 1 tablet  1 tablet Oral QID PRN Nooruddin, Jason Fila, MD       pantoprazole (PROTONIX) EC tablet 40 mg  40 mg Oral Daily Nooruddin, Saad, MD       Current Outpatient Medications  Medication Sig Dispense Refill   aspirin EC 81 MG tablet Take 1 tablet (81 mg total) by mouth daily. Swallow whole. 30 tablet 12   atorvastatin  (LIPITOR) 10 MG tablet Take 1 tablet (10 mg total) by mouth daily. (Patient taking differently: Take 10 mg by mouth at bedtime.) 30 tablet 5   Darbepoetin Alfa (ARANESP) 200 MCG/0.4ML SOSY injection Inject 0.4 mLs (200 mcg total) into the skin every Tuesday at 6 PM. 1.68 mL    dicyclomine (BENTYL) 10 MG capsule Take 10 mg by mouth 3 (three) times daily.     docusate sodium (COLACE) 100 MG capsule Take 100 mg by mouth 2 (two) times daily.     famotidine (PEPCID) 20 MG tablet Take 1 tablet (20 mg total) by mouth daily.     gabapentin (NEURONTIN) 100 MG capsule Take 200 mg by mouth daily.     levothyroxine (SYNTHROID) 100 MCG tablet Take 100 mcg by mouth daily.     lidocaine (LIDODERM) 5 % Place 1 patch onto the skin daily. Remove & Discard patch within 12 hours or as directed by MD 30 patch 0   LOKELMA 10 g PACK packet Take 10 g by mouth daily.     midodrine (PROAMATINE) 10 MG tablet Take 1 tablet (10 mg total) by mouth every Monday, Wednesday, and Friday at 8 PM. Before hemodialysis     nystatin (MYCOSTATIN/NYSTOP) powder Apply 1 Application topically 3 (three) times daily.     oxyCODONE-acetaminophen (PERCOCET/ROXICET) 5-325 MG tablet Take 1 tablet by mouth 4 (four) times daily as needed.     pantoprazole (PROTONIX) 40 MG tablet Take 1 tablet (40 mg total) by mouth daily. 30 tablet 5   pregabalin (LYRICA) 25 MG capsule Take 25 mg by mouth every Monday, Wednesday, and Friday. After HD     pregabalin (LYRICA) 50 MG capsule Take 50 mg by mouth daily.     simethicone (MYLICON) 80 MG chewable tablet Chew 80 mg by mouth every 6 (six) hours as needed for flatulence.     sucroferric oxyhydroxide (VELPHORO) 500 MG chewable tablet Chew 500 mg by mouth 3 (three) times daily with meals.     acetaminophen (TYLENOL) 500 MG tablet Take 1,000 mg by mouth as needed for moderate pain. (Patient not taking: Reported on 11/05/2022)     cyclobenzaprine (FLEXERIL) 10 MG tablet Take 1 tablet (10 mg total) by mouth 2 (two)  times daily as needed for muscle spasms. 20 tablet 0   diphenhydrAMINE (BENADRYL) 50 MG capsule Take 50 mg by mouth at bedtime as needed for sleep.     lactulose (CHRONULAC) 10 GM/15ML solution Take 45 mLs (30 g total) by mouth 2 (two) times daily as needed for mild constipation. (Patient not taking: Reported on  11/05/2022) 236 mL 0   loratadine (CLARITIN) 10 MG tablet Take 1 tablet (10 mg total) by mouth daily as needed for allergies. (Patient not taking: Reported on 11/05/2022)     nitroGLYCERIN (NITROSTAT) 0.4 MG SL tablet Place 1 tablet (0.4 mg total) under the tongue every 5 (five) minutes as needed for chest pain. (Patient not taking: Reported on 11/05/2022) 10 tablet 3   omeprazole (PRILOSEC) 40 MG capsule Take 40 mg by mouth daily. (Patient not taking: Reported on 11/05/2022)     oxycodone (OXY-IR) 5 MG capsule Take 1 capsule (5 mg total) by mouth every 4 (four) hours as needed (for moderaet pain). (Patient not taking: Reported on 11/05/2022) 5 capsule 0   OXYGEN Inhale 4 L into the lungs See admin instructions. Oxygen at 4L hr via nasal cannula and will use portable oxygen machine when up in wheelchair.     polyethylene glycol (MIRALAX / GLYCOLAX) 17 g packet Take 17 g by mouth daily as needed for mild constipation. (Patient not taking: Reported on 11/05/2022) 14 each 0   polyvinyl alcohol (LIQUIFILM TEARS) 1.4 % ophthalmic solution Place 1 drop into both eyes as needed for dry eyes. (Patient not taking: Reported on 11/05/2022) 15 mL 0   sodium chloride 0.9 % SOLN 100 mL with sodium thiosulfate 250 MG/ML SOLN 12.5 g Inject 12.5 g into the vein every Monday, Wednesday, and Friday with hemodialysis. (Patient not taking: Reported on 11/05/2022)      Allergies: Allergies as of 11/05/2022 - Review Complete 11/05/2022  Allergen Reaction Noted   Benzonatate Other (See Comments) 04/20/2017   Hydralazine Other (See Comments) 04/20/2017   Amoxicillin Itching and Nausea And Vomiting 10/05/2019   Clonidine  Other (See Comments) 11/18/2019   Chlorhexidine Itching 08/05/2021   Morphine Itching 03/02/2017   Past Medical History:  Diagnosis Date   Arthritis    Bacteremia    Diabetes mellitus without complication (HCC)    Dialysis complication, subsequent encounter    ESRD (end stage renal disease) (HCC)    Gout    High cholesterol    Hypertension    Hypertensive emergency    Ischemic foot pain at rest 08/03/2021   Renal disorder    Past Surgical History:  Procedure Laterality Date   ABDOMINAL AORTOGRAM W/LOWER EXTREMITY N/A 08/07/2021   Procedure: ABDOMINAL AORTOGRAM W/LOWER EXTREMITY;  Surgeon: Victorino Sparrow, MD;  Location: [facility] INVASIVE CV LAB;  Service: Cardiovascular;  Laterality: N/A;   AMPUTATION Right 08/14/2021   Procedure: AMPUTATION ABOVE KNEE;  Surgeon: Cephus Shelling, MD;  Location: [facility] OR;  Service: Vascular;  Laterality: Right;   AMPUTATION Left 09/23/2021   Procedure: AMPUTATION MIDDLE FINGER;  Surgeon: Marlyne Beards, MD;  Location: [facility] OR;  Service: Orthopedics;  Laterality: Left;   AMPUTATION Left 10/09/2021   Procedure: LEFT ABOVE KNEE AMPUTATION;  Surgeon: Leonie Douglas, MD;  Location: [facility] OR;  Service: Vascular;  Laterality: Left;   AV FISTULA PLACEMENT     x2, unsuccessful.    BUBBLE STUDY  09/27/2021   Procedure: BUBBLE STUDY;  Surgeon: Lewayne Bunting, MD;  Location: [facility] ENDOSCOPY;  Service: Cardiovascular;;   IR FLUORO GUIDE CV LINE LEFT  12/14/2020   IR FLUORO GUIDE CV LINE LEFT  02/20/2021   IR FLUORO GUIDE CV LINE LEFT  09/11/2021   IR FLUORO GUIDE CV LINE LEFT  10/18/2021   IR FLUORO GUIDE CV LINE LEFT  11/26/2021   IR FLUORO GUIDE CV LINE LEFT  11/28/2021   IR  FLUORO GUIDE CV LINE LEFT  02/04/2022   IR FLUORO GUIDE CV LINE LEFT  03/07/2022   IR FLUORO GUIDE CV LINE RIGHT  08/28/2020   IR FLUORO GUIDE CV LINE RIGHT  05/13/2021   IR FLUORO GUIDE CV LINE RIGHT  05/24/2021   IR INTRAVASCULAR ULTRASOUND NON CORONARY  03/07/2022   IR PERC TUN PERIT CATH WO PORT S&I  /IMAG  09/06/2020   IR PTA VENOUS EXCEPT DIALYSIS CIRCUIT  02/20/2021   IR PTA VENOUS EXCEPT DIALYSIS CIRCUIT  05/13/2021   IR PTA VENOUS EXCEPT DIALYSIS CIRCUIT  11/28/2021   IR PTA VENOUS EXCEPT DIALYSIS CIRCUIT  03/07/2022   IR REMOVAL TUN CV CATH W/O FL  05/24/2021   IR REMOVAL TUN CV CATH W/O FL  10/15/2021   IR US GUIDE VASC ACCESS LEFT  09/06/2020   IR US GUIDE VASC ACCESS LEFT  09/11/2021   IR US GUIDE VASC ACCESS LEFT  10/18/2021   IR US GUIDE VASC ACCESS RIGHT  05/24/2021   IR US GUIDE VASC ACCESS RIGHT  09/10/2021   IR VENO/JUGULAR RIGHT  09/10/2021   IR VENOCAVAGRAM SVC  02/20/2021   IR VENOCAVAGRAM SVC  11/26/2021   IR VENOCAVAGRAM SVC  03/07/2022   TEE WITHOUT CARDIOVERSION N/A 09/27/2021   Procedure: TRANSESOPHAGEAL ECHOCARDIOGRAM (TEE);  Surgeon: Lewayne Bunting, MD;  Location: [facility] ENDOSCOPY;  Service: Cardiovascular;  Laterality: N/A;   UPPER EXTREMITY VENOGRAPHY Right 12/04/2021   Procedure: UPPER EXTREMITY VENOGRAPHY;  Surgeon: Victorino Sparrow, MD;  Location: [facility] INVASIVE CV LAB;  Service: Cardiovascular;  Laterality: Right;   Family History  Problem Relation Age of Onset   Diabetes Mother    Diabetes Father    Social History   Socioeconomic History   Marital status: Single    Spouse name: Not on file   Number of children: Not on file   Years of education: Not on file   Highest education level: Not on file  Occupational History   Not on file  Tobacco Use   Smoking status: Never   Smokeless tobacco: Never  Vaping Use   Vaping status: Never Used  Substance and Sexual Activity   Alcohol use: Never   Drug use: Never   Sexual activity: Not on file  Other Topics Concern   Not on file  Social History Narrative   Not on file   Social Determinants of Health   Financial Resource Strain: High Risk (03/27/2018)   [facility] from [facility]   Overall Financial Resource Strain (CARDIA)    Difficulty of Paying Living Expenses: Very hard  Food Insecurity: No  Food Insecurity (12/11/2021)   Hunger Vital Sign    Worried About Running Out of Food in the Last Year: Never true    Ran Out of Food in the Last Year: Never true  Transportation Needs: No Transportation Needs (12/11/2021)   PRAPARE - Administrator, Civil Service (Medical): No    Lack of Transportation (Non-Medical): No  Physical Activity: Not on file  Stress: Not on file  Social Connections: Unknown (08/20/2021)   Received from [facility], [facility]   Social Network    Social Network: Not on file  Intimate Partner Violence: Not At Risk (12/11/2021)   Humiliation, Afraid, Rape, and Kick questionnaire    Fear of Current or Ex-Partner: No    Emotionally Abused: No    Physically Abused: No    Sexually Abused: No    Review of Systems:  Pertinent items noted in HPI and remainder of comprehensive ROS otherwise negative.  Physical Exam: Blood pressure (!) 112/99, pulse 80, temperature 98.3 F (36.8 C), temperature source Oral, resp. rate 19, SpO2 91%. BP (!) 112/99   Pulse 80   Temp 98.3 F (36.8 C) (Oral)   Resp 19   SpO2 91%  General appearance:  fatigued, alert, cooperative, NAD Head: Normocephalic, without obvious abnormality, atraumatic Lungs: clear to auscultation bilaterally Heart: regular rate and rhythm, S1, S2 normal, no murmur, click, rub or gallop Abdomen:  soft, tenderness on palpation, non-distended  Extremities:  bilateral above the knee amputations   Lab results:    Latest Ref Rng & Units 11/05/2022   11:55 AM 11/05/2022   10:41 AM 09/09/2022    2:35 AM  CBC  WBC 4.0 - 10.5 K/uL  6.7  9.4   Hemoglobin 12.0 - 15.0 g/dL 19.1  47.8  29.5   Hematocrit 36.0 - 46.0 % 36.0  36.5  33.8   Platelets 150 - 400 K/uL  96  138       Latest Ref Rng & Units 11/05/2022   11:55 AM 11/05/2022   10:41 AM 09/09/2022    2:35 AM  CMP  Glucose 70 - 99 mg/dL  87  98   BUN 6 - 20 mg/dL  55  53   Creatinine 6.21 - 1.00 mg/dL  3.08  6.57   Sodium 846 - 145 mmol/L 135   137  136   Potassium 3.5 - 5.1 mmol/L 5.3  5.3  5.2   Chloride 98 - 111 mmol/L  94  95   CO2 22 - 32 mmol/L  23  22   Calcium 8.9 - 10.3 mg/dL  9.4  9.7   Total Protein 6.5 - 8.1 g/dL  8.1  7.5   Total Bilirubin 0.3 - 1.2 mg/dL  0.8  0.4   Alkaline Phos 38 - 126 U/L  80  66   AST 15 - 41 U/L  23  14   ALT 0 - 44 U/L  21  12    Recent Labs    11/05/22 1102 11/05/22 1232  GLUCAP 69* 82    Imaging results:  DG Chest Port 1 View  Result Date: 11/05/2022 CLINICAL DATA:  CHF EXAM: PORTABLE CHEST 1 VIEW COMPARISON:  09/08/2022 FINDINGS: Enlarged cardiopericardial silhouette. Calcified aorta. Vascular congestion. No pneumothorax, effusion. No edema. Overlapping cardiac leads. Left IJ double-lumen catheter with tip along the right side of the heart. IMPRESSION: Enlarged cardiopericardial silhouette with vascular congestion. Stable large bore left IJ catheter Electronically Signed   By: Karen Kays M.D.   On: 11/05/2022 11:53    Other results: EKG: normal EKG, normal sinus rhythm.  Assessment & Plan by Problem: Principal Problem:   Acute exacerbation of CHF (congestive heart failure) (HCC) Active Problems:   ESRD on dialysis (HCC)   Chronic respiratory failure with hypoxia (HCC)   Elevated troponin level not due to acute coronary syndrome   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HCC)   Generalized weakness   Ceresa Mccleskey is a 52 y.o. female with a PMHx of HTN, ESRD on iHD, NSTEMI, obesity and DM who presents with generalized weakness and fatigue concerning for acute on chronic heart failure exacerbation  Active Problems  #Acute on chronic diastolic congestive heart failure exacerbation  #Elevated troponins  H/o NSTEMI  Patient presenting with recent onset generalized weakness and fatigue. Elevated BNP and imaging findings of vascular congestion c/f acute exacerbation  of her heart failure. EKG showed no evidence of ST elevations or TWI. Trop I 242 --> 239. BNP 1832. Prior  ECHO in 10/21/21 showed EF 65-70%. Risk factors include prior NSTEMI (05/2021), DM, HTN, obesity, and renal failure (controlled with HD).  Multiple risk factors for heart failure but iso recent diarrhea suspect possible infection or dehydration that led to acute heart failure exacerbation. Will proceed with dialysis for volume management.   -Cardiology consulted, appreciate recs -Cont bASA  -Tele  -Obtain ECHO  -CMP -CBC  -Dialysis  -Continue nasal cannula, wean O2 as tolerated   #ESRD  H/o dialysis  #Hyperkalemia #Chronic anemia  Dialysis schedule MWF. Had plans to go to dialysis today but felt poor and proceeded to come to the ED. K 5.3. Hb 12.2. Has fistula in place at left chest. Previous history of hypotension during HD. Takes midodrine during dialysis. Patient not aware whether dialysis catheter in past has been switched out.     -Nephrology consulted, appreciate recs -Proceed with dialysis -Cont midodrine 10mg  during dialysis days  -Blood cultures   #Generalized weakness #Fatigue Multiple possible etiologies that include dehydration from recent diarrhea, heart failure, anemia 2/2 ESRD, hypothyroidism, hypoglycemia, chronic respiratory failure with hypoxemia, and infection. Less likely d/t normal WBC and clinical signs of infection. Hb 11.2. Glucose 69 --> 82.     -TSH  -CBC  -Consider iron panel and ferritin  -Continue workup for other etiologies as stated above   #Abdominal discomfort #H/o calciphylaxis Endorses bilateral abdominal discomfort. Previously had ulcer in her left anterior abdominal wall. Recent hospital admission on 05/2022 noted open wound right flank was clean and dry and open left lower abdominal wall would that was mostly chronic and managed with dressings.    -Cont home dicyclomine 10mg  TID  -Flexeril 10mg  BID PRN -Oxycodone/Acetaminophen 5-325mg  QID PRN   Chronic Problems  #Hypothyroidism: Cont home levothyroxine 100 mcg p.o. daily   #T2DM: A1C 5.1  on June 2024. Diet controlled   #HLD: Cont home atorvastatin 10 mg daily   #GERD: Cont home Protonix 40mg  daily   #Phantom leg pain: Takes Gabapentin and Lyrica at home. Will continue home gabapentin 200mg  daily and Lyrica 50mg  daily   #HTN: Previously prescribed ACEi. Not currently taking any home hypertensives. BP stable   #Chronic respiratory failure: uses 3-4L oxygen at home as needed. Currently on nasal cannula. Wean O2 as tolerated   #Peripheral artery disease: s/p bilateral AKA. Managed with ASA and atorvastatin as listed above    #Constipation: Takes Miralax daily at home. Hold for now iso recent diarrhea   DVT prophx: heparin  Diet: Renal Bowel: PRN Code: Full   Prior to Admission Living Arrangement: Home Anticipated Discharge Location: Home Barriers to Discharge: Medical Workup  Dispo: Admit patient to Observation with expected length of stay less than 2 midnights.  This is a Psychologist, occupational Note.  The care of the patient was discussed with Dr. Carlynn Purl and the assessment and plan was formulated with their assistance.  Please see their note for official documentation of the patient encounter.   Signed: Lona Millard, MS4 11/05/2022, 4:52 PM   I have seen and examined the patient myself, and I have reviewed the note by Georgann Housekeeper, MS4 and was present during the interview and physical exam.   Signed: Monna Fam, MD 11/05/2022, 5:23 PM

## 2022-11-05 NOTE — ED Notes (Signed)
ED TO INPATIENT HANDOFF REPORT  ED Nurse Name and Phone #:  Cheral Almas [phone]  S Name/Age/Gender Courtney Gray 52 y.o. female Room/Bed: 046C/046C  Code Status   Code Status: Full Code  Home/SNF/Other Home Patient oriented to: self, place, time, and situation Is this baseline? Yes   Triage Complete: Triage complete  Chief Complaint Acute exacerbation of CHF (congestive heart failure) (HCC) [I50.9]  Triage Note TRIAGE NOTE  Patient arrives by EMS from [facility] with c/o generalized weakness and reports feels like she is dehydrated.  Patient is dialysis patient MWF, was due for dialysis today.   Patient reports yesterday had 4 BM and reports some diarrhea.  Patient reports dry mouth.  Patient on 2L [place] at baseline for CHF and COPD.    EMS VITALS:   BP 150 83 CBG 97   Allergies Allergies  Allergen Reactions   Benzonatate Other (See Comments)    Hallucinations   Hydralazine Other (See Comments)    Hallucinations   Amoxicillin Itching and Nausea And Vomiting   Clonidine Other (See Comments)    Altered mental state, insomnia    Chlorhexidine Itching    Patient reports it is the CHG baths that cause her itching not the CHG used for caring for her IV/HD access.     Morphine Itching    Level of Care/Admitting Diagnosis ED Disposition     ED Disposition  Admit   Condition  --   Comment  Hospital Area: [facility] [[id number]]  Level of Care: Telemetry Medical [104]  May place patient in observation at [facility] or [facility] if equivalent level of care is available:: No  Covid Evaluation: Asymptomatic - no recent exposure (last 10 days) testing not required  Diagnosis: Acute exacerbation of CHF (congestive heart failure) [facility] [[id number]]  Admitting Physician: Gust Rung [[id number]]  Attending Physician: Gust Rung [[id number]]          B Medical/Surgery History Past Medical History:  Diagnosis Date   Arthritis    Bacteremia     Diabetes mellitus without complication (HCC)    Dialysis complication, subsequent encounter    ESRD (end stage renal disease) (HCC)    Gout    High cholesterol    Hypertension    Hypertensive emergency    Ischemic foot pain at rest 08/03/2021   Renal disorder    Past Surgical History:  Procedure Laterality Date   ABDOMINAL AORTOGRAM W/LOWER EXTREMITY N/A 08/07/2021   Procedure: ABDOMINAL AORTOGRAM W/LOWER EXTREMITY;  Surgeon: Victorino Sparrow, MD;  Location: [facility] INVASIVE CV LAB;  Service: Cardiovascular;  Laterality: N/A;   AMPUTATION Right 08/14/2021   Procedure: AMPUTATION ABOVE KNEE;  Surgeon: Cephus Shelling, MD;  Location: [facility] OR;  Service: Vascular;  Laterality: Right;   AMPUTATION Left 09/23/2021   Procedure: AMPUTATION MIDDLE FINGER;  Surgeon: Marlyne Beards, MD;  Location: [facility] OR;  Service: Orthopedics;  Laterality: Left;   AMPUTATION Left 10/09/2021   Procedure: LEFT ABOVE KNEE AMPUTATION;  Surgeon: Leonie Douglas, MD;  Location: [facility] OR;  Service: Vascular;  Laterality: Left;   AV FISTULA PLACEMENT     x2, unsuccessful.    BUBBLE STUDY  09/27/2021   Procedure: BUBBLE STUDY;  Surgeon: Lewayne Bunting, MD;  Location: [facility] ENDOSCOPY;  Service: Cardiovascular;;   IR FLUORO GUIDE CV LINE LEFT  12/14/2020   IR FLUORO GUIDE CV LINE LEFT  02/20/2021   IR FLUORO GUIDE CV LINE LEFT  09/11/2021   IR FLUORO  GUIDE CV LINE LEFT  10/18/2021   IR FLUORO GUIDE CV LINE LEFT  11/26/2021   IR FLUORO GUIDE CV LINE LEFT  11/28/2021   IR FLUORO GUIDE CV LINE LEFT  02/04/2022   IR FLUORO GUIDE CV LINE LEFT  03/07/2022   IR FLUORO GUIDE CV LINE RIGHT  08/28/2020   IR FLUORO GUIDE CV LINE RIGHT  05/13/2021   IR FLUORO GUIDE CV LINE RIGHT  05/24/2021   IR INTRAVASCULAR ULTRASOUND NON CORONARY  03/07/2022   IR PERC TUN PERIT CATH WO PORT S&I /IMAG  09/06/2020   IR PTA VENOUS EXCEPT DIALYSIS CIRCUIT  02/20/2021   IR PTA VENOUS EXCEPT DIALYSIS CIRCUIT  05/13/2021   IR PTA VENOUS EXCEPT DIALYSIS CIRCUIT  11/28/2021    IR PTA VENOUS EXCEPT DIALYSIS CIRCUIT  03/07/2022   IR REMOVAL TUN CV CATH W/O FL  05/24/2021   IR REMOVAL TUN CV CATH W/O FL  10/15/2021   IR US GUIDE VASC ACCESS LEFT  09/06/2020   IR US GUIDE VASC ACCESS LEFT  09/11/2021   IR US GUIDE VASC ACCESS LEFT  10/18/2021   IR US GUIDE VASC ACCESS RIGHT  05/24/2021   IR US GUIDE VASC ACCESS RIGHT  09/10/2021   IR VENO/JUGULAR RIGHT  09/10/2021   IR VENOCAVAGRAM SVC  02/20/2021   IR VENOCAVAGRAM SVC  11/26/2021   IR VENOCAVAGRAM SVC  03/07/2022   TEE WITHOUT CARDIOVERSION N/A 09/27/2021   Procedure: TRANSESOPHAGEAL ECHOCARDIOGRAM (TEE);  Surgeon: Lewayne Bunting, MD;  Location: [facility] ENDOSCOPY;  Service: Cardiovascular;  Laterality: N/A;   UPPER EXTREMITY VENOGRAPHY Right 12/04/2021   Procedure: UPPER EXTREMITY VENOGRAPHY;  Surgeon: Victorino Sparrow, MD;  Location: [facility] INVASIVE CV LAB;  Service: Cardiovascular;  Laterality: Right;     A IV Location/Drains/Wounds Patient Lines/Drains/Airways Status     Active Line/Drains/Airways     Name Placement date Placement time Site Days   Peripheral IV 11/05/22 20 G Left Antecubital 11/05/22  1357  Antecubital  less than 1   Fistula / Graft Right Upper arm Arteriovenous fistula 11/28/19  --  Upper arm  1073   Hemodialysis Catheter Left Internal jugular Double lumen Permanent (Tunneled) 03/07/22  1105  Internal jugular  243   Pressure Injury 05/26/22 Ischial tuberosity Right Stage 4 - Full thickness tissue loss with exposed bone, tendon or muscle. 4 cms x 2 cms x 5 cms 05/26/22  0800  -- 163   Wound / Incision (Open or Dehisced) 05/26/22 Non-pressure wound Abdomen Left;Lower 4 cms x 3 cms x 0.8 cms full thickness 05/26/22  0800  Abdomen  163   Wound / Incision (Open or Dehisced) 05/26/22 Non-pressure wound Flank Lower;Right;Lateral 5 cms x 1 cms x 0.1 cms full thickness 05/26/22  0800  Flank  163            Intake/Output Last 24 hours No intake or output data in the 24 hours ending 11/05/22  2019  Labs/Imaging Results for orders placed or performed during the hospital encounter of 11/05/22 (from the past 48 hour(s))  Comprehensive metabolic panel     Status: Abnormal   Collection Time: 11/05/22 10:41 AM  Result Value Ref Range   Sodium 137 135 - 145 mmol/L   Potassium 5.3 (H) 3.5 - 5.1 mmol/L   Chloride 94 (L) 98 - 111 mmol/L   CO2 23 22 - 32 mmol/L   Glucose, Bld 87 70 - 99 mg/dL    Comment: Glucose reference range applies only to samples taken  after fasting for at least 8 hours.   BUN 55 (H) 6 - 20 mg/dL   Creatinine, Ser 1.30 (H) 0.44 - 1.00 mg/dL   Calcium 9.4 8.9 - 86.5 mg/dL   Total Protein 8.1 6.5 - 8.1 g/dL   Albumin 3.7 3.5 - 5.0 g/dL   AST 23 15 - 41 U/L   ALT 21 0 - 44 U/L   Alkaline Phosphatase 80 38 - 126 U/L   Total Bilirubin 0.8 0.3 - 1.2 mg/dL   GFR, Estimated 4 (L) >60 mL/min    Comment: (NOTE) Calculated using the CKD-EPI Creatinine Equation (2021)    Anion gap 20 (H) 5 - 15    Comment: Performed at [facility], [address], [place], [place] [address]  CBC with Differential     Status: Abnormal   Collection Time: 11/05/22 10:41 AM  Result Value Ref Range   WBC 6.7 4.0 - 10.5 K/uL   RBC 3.83 (L) 3.87 - 5.11 MIL/uL   Hemoglobin 11.2 (L) 12.0 - 15.0 g/dL   HCT 62.9 52.8 - 41.3 %   MCV 95.3 80.0 - 100.0 fL   MCH 29.2 26.0 - 34.0 pg   MCHC 30.7 30.0 - 36.0 g/dL   RDW 24.4 (H) 01.0 - 27.2 %   Platelets 96 (L) 150 - 400 K/uL    Comment: Immature Platelet Fraction may be clinically indicated, consider ordering this additional test [id number] REPEATED TO VERIFY    nRBC 0.0 0.0 - 0.2 %   Neutrophils Relative % 69 %   Neutro Abs 4.6 1.7 - 7.7 K/uL   Lymphocytes Relative 14 %   Lymphs Abs 1.0 0.7 - 4.0 K/uL   Monocytes Relative 12 %   Monocytes Absolute 0.8 0.1 - 1.0 K/uL   Eosinophils Relative 3 %   Eosinophils Absolute 0.2 0.0 - 0.5 K/uL   Basophils Relative 1 %   Basophils Absolute 0.0 0.0 - 0.1 K/uL   Immature Granulocytes 1 %    Abs Immature Granulocytes 0.03 0.00 - 0.07 K/uL    Comment: Performed at [facility], [address], [place], [place] [address]  Troponin I (High Sensitivity)     Status: Abnormal   Collection Time: 11/05/22 10:41 AM  Result Value Ref Range   Troponin I (High Sensitivity) 242 (HH) <18 ng/L    Comment: CRITICAL RESULT CALLED TO, READ BACK BY AND VERIFIED WITH M.ROBERTS,RN 1248 11/05/22 CLARK,S (NOTE) Elevated high sensitivity troponin I (hsTnI) values and significant  changes across serial measurements may suggest ACS but many other  chronic and acute conditions are known to elevate hsTnI results.  Refer to the "Links" section for chest pain algorithms and additional  guidance. Performed at [facility], [address] [place], [place] [address]   Brain natriuretic peptide     Status: Abnormal   Collection Time: 11/05/22 10:41 AM  Result Value Ref Range   B Natriuretic Peptide 1,832.9 (H) 0.0 - 100.0 pg/mL    Comment: Performed at [facility], [address], [place], [place] [address]  Resp panel by RT-PCR (RSV, Flu A&B, Covid) Anterior Nasal Swab     Status: None   Collection Time: 11/05/22 10:41 AM   Specimen: Anterior Nasal Swab  Result Value Ref Range   SARS Coronavirus 2 by RT PCR NEGATIVE NEGATIVE   Influenza A by PCR NEGATIVE NEGATIVE   Influenza B by PCR NEGATIVE NEGATIVE    Comment: (NOTE) The Xpert Xpress  SARS-CoV-2/FLU/RSV plus assay is intended as an aid in the diagnosis of influenza from Nasopharyngeal swab specimens and should not be used as a sole basis for treatment. Nasal washings and aspirates are unacceptable for Xpert Xpress SARS-CoV-2/FLU/RSV testing.  Fact Sheet for Patients: BloggerCourse.com  Fact Sheet for Healthcare Providers: SeriousBroker.it  This test is not yet approved or cleared by the [place] FDA and has been authorized for detection and/or diagnosis of SARS-CoV-2  by FDA under an Emergency Use Authorization (EUA). This EUA will remain in effect (meaning this test can be used) for the duration of the COVID-19 declaration under Section 564(b)(1) of the Act, 21 U.S.C. section 360bbb-3(b)(1), unless the authorization is terminated or revoked.     Resp Syncytial Virus by PCR NEGATIVE NEGATIVE    Comment: (NOTE) Fact Sheet for Patients: BloggerCourse.com  Fact Sheet for Healthcare Providers: SeriousBroker.it  This test is not yet approved or cleared by the [place] FDA and has been authorized for detection and/or diagnosis of SARS-CoV-2 by FDA under an Emergency Use Authorization (EUA). This EUA will remain in effect (meaning this test can be used) for the duration of the COVID-19 declaration under Section 564(b)(1) of the Act, 21 U.S.C. section 360bbb-3(b)(1), unless the authorization is terminated or revoked.  Performed at [facility], [address], [place], [place] [address]   Lipase, blood     Status: None   Collection Time: 11/05/22 10:41 AM  Result Value Ref Range   Lipase 36 11 - 51 U/L    Comment: Performed at [facility], [address] [place], [place] [address]  CBG monitoring, ED     Status: Abnormal   Collection Time: 11/05/22 11:02 AM  Result Value Ref Range   Glucose-Capillary 69 (L) 70 - 99 mg/dL    Comment: Glucose reference range applies only to samples taken after fasting for at least 8 hours.  I-Stat venous blood gas, ED     Status: Abnormal   Collection Time: 11/05/22 11:55 AM  Result Value Ref Range   pH, Ven 7.331 7.25 - 7.43   pCO2, Ven 44.7 44 - 60 mmHg   pO2, Ven 46 (H) 32 - 45 mmHg   Bicarbonate 23.7 20.0 - 28.0 mmol/L   TCO2 25 22 - 32 mmol/L   O2 Saturation 78 %   Acid-base deficit 2.0 0.0 - 2.0 mmol/L   Sodium 135 135 - 145 mmol/L   Potassium 5.3 (H) 3.5 - 5.1 mmol/L   Calcium, Ion 1.05 (L) 1.15 - 1.40 mmol/L   HCT 36.0 36.0 - 46.0 %    Hemoglobin 12.2 12.0 - 15.0 g/dL   Sample type VENOUS   CBG monitoring, ED     Status: None   Collection Time: 11/05/22 12:32 PM  Result Value Ref Range   Glucose-Capillary 82 70 - 99 mg/dL    Comment: Glucose reference range applies only to samples taken after fasting for at least 8 hours.  Troponin I (High Sensitivity)     Status: Abnormal   Collection Time: 11/05/22 12:41 PM  Result Value Ref Range   Troponin I (High Sensitivity) 239 (HH) <18 ng/L    Comment: CRITICAL VALUE NOTED. VALUE IS CONSISTENT WITH PREVIOUSLY REPORTED/CALLED VALUE (NOTE) Elevated high sensitivity troponin I (hsTnI) values and significant  changes across serial measurements may suggest ACS but many other  chronic and acute conditions are known to elevate hsTnI results.  Refer to the "Links" section for chest pain algorithms and additional  guidance. Performed at [facility], [address], [place], [place] [address]   Culture, blood (Routine X 2) w Reflex to ID Panel     Status: None (Preliminary result)   Collection Time: 11/05/22  4:13 PM   Specimen: BLOOD  Result Value Ref Range   Specimen Description BLOOD LEFT UPPER HAND    Special Requests      BOTTLES DRAWN AEROBIC ONLY Blood Culture adequate volume Performed at [facility], [address] [place], [place] [address]    Culture PENDING    Report Status PENDING    DG Chest Port 1 View  Result Date: 11/05/2022 CLINICAL DATA:  CHF EXAM: PORTABLE CHEST 1 VIEW COMPARISON:  09/08/2022 FINDINGS: Enlarged cardiopericardial silhouette. Calcified aorta. Vascular congestion. No pneumothorax, effusion. No edema. Overlapping cardiac leads. Left IJ double-lumen catheter with tip along the right side of the heart. IMPRESSION: Enlarged cardiopericardial silhouette with vascular congestion. Stable large bore left IJ catheter Electronically Signed   By: Karen Kays M.D.   On: 11/05/2022 11:53    Pending Labs Unresulted Labs (From admission,  onward)     Start     Ordered   11/06/22 0500  Basic metabolic panel  Tomorrow morning,   R        11/05/22 1516   11/06/22 0500  CBC  Tomorrow morning,   R        11/05/22 1516   11/05/22 1725  Phosphorus  Once,   R        11/05/22 1724   11/05/22 1615  TSH  Once,   R        11/05/22 1614   11/05/22 1613  Culture, blood (Routine X 2) w Reflex to ID Panel  BLOOD CULTURE X 2,   R (with TIMED occurrences)      11/05/22 1612            Vitals/Pain Today's Vitals   11/05/22 1530 11/05/22 1552 11/05/22 1811 11/05/22 1909  BP:    114/85  Pulse: 80   74  Resp: (!) 23 19  18   Temp:   (!) 97.4 F (36.3 C) 97.8 F (36.6 C)  TempSrc:   Oral Oral  SpO2: 100% 91%  100%  PainSc:    3     Isolation Precautions No active isolations  Medications Medications  heparin injection 5,000 Units (5,000 Units Subcutaneous Not Given 11/05/22 1742)  aspirin EC tablet 81 mg (81 mg Oral Given 11/05/22 1740)  cyclobenzaprine (FLEXERIL) tablet 10 mg (10 mg Oral Given 11/05/22 1742)  dicyclomine (BENTYL) capsule 10 mg (10 mg Oral Given 11/05/22 1741)  diphenhydrAMINE (BENADRYL) capsule 50 mg (has no administration in time range)  gabapentin (NEURONTIN) capsule 200 mg (200 mg Oral Given 11/05/22 1741)  midodrine (PROAMATINE) tablet 10 mg (has no administration in time range)  oxyCODONE-acetaminophen (PERCOCET/ROXICET) 5-325 MG per tablet 1 tablet (1 tablet Oral Given 11/05/22 1742)  pantoprazole (PROTONIX) EC tablet 40 mg (40 mg Oral Given 11/05/22 1741)  levothyroxine (SYNTHROID) tablet 100 mcg (has no administration in time range)  atorvastatin (LIPITOR) tablet 10 mg (10 mg Oral Given 11/05/22 1741)    Mobility walks with device     Focused Assessments Renal Assessment Handoff:  Hemodialysis Schedule: Hemodialysis Schedule: Monday/Wednesday/Friday Last Hemodialysis date and time: 11/03/2022   Restricted appendage: right arm Pt has left chest permcath   R Recommendations: See Admitting  Provider Note  Report given to:   Additional Notes:

## 2022-11-06 ENCOUNTER — Observation Stay (HOSPITAL_COMMUNITY): Payer: 59

## 2022-11-06 DIAGNOSIS — I340 Nonrheumatic mitral (valve) insufficiency: Secondary | ICD-10-CM | POA: Diagnosis not present

## 2022-11-06 DIAGNOSIS — R7989 Other specified abnormal findings of blood chemistry: Secondary | ICD-10-CM

## 2022-11-06 DIAGNOSIS — I5033 Acute on chronic diastolic (congestive) heart failure: Secondary | ICD-10-CM

## 2022-11-06 LAB — BASIC METABOLIC PANEL WITH GFR
Anion gap: 18 — ABNORMAL HIGH (ref 5–15)
BUN: 66 mg/dL — ABNORMAL HIGH (ref 6–20)
CO2: 22 mmol/L (ref 22–32)
Calcium: 9.3 mg/dL (ref 8.9–10.3)
Chloride: 93 mmol/L — ABNORMAL LOW (ref 98–111)
Creatinine, Ser: 11.24 mg/dL — ABNORMAL HIGH (ref 0.44–1.00)
GFR, Estimated: 4 mL/min — ABNORMAL LOW
Glucose, Bld: 107 mg/dL — ABNORMAL HIGH (ref 70–99)
Potassium: 5.9 mmol/L — ABNORMAL HIGH (ref 3.5–5.1)
Sodium: 133 mmol/L — ABNORMAL LOW (ref 135–145)

## 2022-11-06 LAB — ECHOCARDIOGRAM COMPLETE
AR max vel: 1.89 cm2
AV Area VTI: 1.8 cm2
AV Area mean vel: 1.84 cm2
AV Mean grad: 11.5 mmHg
AV Peak grad: 21.1 mmHg
Ao pk vel: 2.3 m/s
Area-P 1/2: 4.68 cm2
Calc EF: 32.8 %
MV M vel: 5.36 m/s
MV Peak grad: 114.9 mmHg
MV VTI: 1.75 cm2
Radius: 1.2 cm
S' Lateral: 4.8 cm
Single Plane A2C EF: 33.9 %
Single Plane A4C EF: 30.4 %
Weight: 4345.71 [oz_av]

## 2022-11-06 LAB — CBC
HCT: 33.5 % — ABNORMAL LOW (ref 36.0–46.0)
Hemoglobin: 10.6 g/dL — ABNORMAL LOW (ref 12.0–15.0)
MCH: 30.4 pg (ref 26.0–34.0)
MCHC: 31.6 g/dL (ref 30.0–36.0)
MCV: 96 fL (ref 80.0–100.0)
Platelets: 103 K/uL — ABNORMAL LOW (ref 150–400)
RBC: 3.49 MIL/uL — ABNORMAL LOW (ref 3.87–5.11)
RDW: 15.9 % — ABNORMAL HIGH (ref 11.5–15.5)
WBC: 7.1 K/uL (ref 4.0–10.5)
nRBC: 0 % (ref 0.0–0.2)

## 2022-11-06 LAB — HEPATITIS B SURFACE ANTIGEN: Hepatitis B Surface Ag: NONREACTIVE

## 2022-11-06 LAB — TSH: TSH: 4.008 u[IU]/mL (ref 0.350–4.500)

## 2022-11-06 LAB — PHOSPHORUS: Phosphorus: 9 mg/dL — ABNORMAL HIGH (ref 2.5–4.6)

## 2022-11-06 MED ORDER — SUCROFERRIC OXYHYDROXIDE 500 MG PO CHEW
1000.0000 mg | CHEWABLE_TABLET | Freq: Three times a day (TID) | ORAL | Status: DC
  Administered 2022-11-07 – 2022-11-13 (×9): 1000 mg via ORAL
  Filled 2022-11-06 (×14): qty 2

## 2022-11-06 MED ORDER — HEPARIN SODIUM (PORCINE) 1000 UNIT/ML IJ SOLN
INTRAMUSCULAR | Status: AC
  Administered 2022-11-06: 1000 [IU]
  Filled 2022-11-06: qty 9

## 2022-11-06 MED ORDER — SODIUM THIOSULFATE 250 MG/ML IV SOLN
25.0000 g | INTRAVENOUS | Status: DC
  Administered 2022-11-10 – 2022-11-12 (×2): 25 g via INTRAVENOUS
  Filled 2022-11-06 (×3): qty 100

## 2022-11-06 NOTE — Progress Notes (Signed)
  Echocardiogram 2D Echocardiogram has been performed.  Courtney Gray 11/06/2022, 10:45 AM

## 2022-11-06 NOTE — Progress Notes (Signed)
CSW spoke with Courtney Gray from [facility]. Wynona Canes confirmed pt is LTC at [facility]. Pt will return to [facility] when medically stable.   Oletta Lamas, MSW, LCSWA, LCASA Transitions of Care  Clinical Social Worker I

## 2022-11-06 NOTE — Progress Notes (Signed)
Pt receives out-pt HD at [facility] on MWF. Will assist as needed.   Tracy Mounce Renal Navigator [phone] 

## 2022-11-06 NOTE — Plan of Care (Signed)
°  Problem: Coping: °Goal: Level of anxiety will decrease °Outcome: Progressing °  °

## 2022-11-06 NOTE — Consult Note (Addendum)
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  Cover with dry gauze and silicone foam. Patient likes silicone foam edges taped for security of dressing.    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  Patient likes to have skin prep applied daily and foam dressings applied to protect area.    I left supplies in room for both ischial dressing and bilateral lower quadrants.  POC discussed with patient, daughter and bedside nurse.  WOC team will not follow at this time.  Re-consult if further needs arise.   Thank you,    Priscella Mann MSN, RN-BC, [employer] [phone]

## 2022-11-06 NOTE — Progress Notes (Addendum)
Mobility Specialist Progress Note  Patient not appropriate for mobility specialist at this time given level of complexity, physical assist, and/or precautions. Mobility specialist to hold and await PT evaluation/assessment to determine goals.  Courtney Gray, BS EXP Mobility Specialist Please contact via SecureChat or Rehab office at [phone]

## 2022-11-06 NOTE — Plan of Care (Signed)

## 2022-11-06 NOTE — Progress Notes (Signed)
   11/06/22 1853  Vitals  Temp 98.7 F (37.1 C)  BP (!) 125/44  O2 Device Nasal Cannula  Weight 121.2 kg  Type of Weight (S)  Post-Dialysis (Bed Scale)  Oxygen Therapy  O2 Flow Rate (L/min) 3 L/min  Patient Activity (if Appropriate) In bed  Post Treatment  Dialyzer Clearance Clear  Duration of HD Treatment -hour(s) 4 hour(s)  Hemodialysis Intake (mL) 0 mL  Liters Processed 84.6  Fluid Removed (mL) 3000 mL  Tolerated HD Treatment Yes  Post-Hemodialysis Comments Pt. Voice no complaints. VSS and report call to [facility] bedside RN Tilden Dome   Received patient in bed to unit.  Alert and oriented.  Informed consent signed and in chart.   TX duration:4  Patient tolerated well.  Transported back to the room  Alert, without acute distress.  Hand-off given to patient's nurse.   Access used: Yes Access issues: No  Total UF removed: 3000 Medication(s) given: See MAR Post HD VS: See Above Grid Post HD weight: 121.2 kg   [facility]

## 2022-11-06 NOTE — Progress Notes (Addendum)
Subjective:   Seen in rm , denies sob , only  co s weakness, requesting 4 l UF  with HD today .   Objective Vital signs in last 24 hours: Vitals:   11/05/22 2301 11/06/22 0003 11/06/22 0424 11/06/22 0730  BP:  126/68 (!) 109/96 98/84  Pulse:  71  75  Resp:  15 18 16   Temp: 98.6 F (37 C) 98.6 F (37 C) 98 F (36.7 C) 98.3 F (36.8 C)  TempSrc: Oral Oral Oral Oral  SpO2:  100%  100%  Weight:  123.2 kg 123.2 kg    Weight change:   Physical Exam: General: Alert  Obese, chronically ill appearing Female, NAD  Heart: RRR, 1/6/sem, no rg Lungs: CTA  but distant sounds  2/2 body habitus /  3 l [place] O2, nonlabored  Abdomen: Morbid obese, NABS, soft , min tender lower quad, ND, abd wall edema  mild  Extremities: Bilat AKA, no stump edema,  Dialysis Access: L  internal jugular TDC       OP Dialysis Orders: Center: [facility] MWF   . EDW 119,  2k, 2ca bath  Hep 5000 units, L internal jugular TDC  HD Bath Mircera 50 mcg  (last given 11/03/22) Venofer 50 mg  w wkly hd    Hectorol 4 mcg     Assessment/Plan Weakness= ? Etiology  Infxn , food poisoning ,  ? Viral illness ,  BC  no growth  1 day ( has TDC )  Mild Volume ?  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 , Sodium thiosulfate on HD  ( last PTH 1053 on 10/22/22  )  Chronic Back pain = meds per admit   Lenny Pastel, PA-C [facility] Beeper [phone] 11/06/2022,11:40 AM   LOS: 0 days   Labs: Basic Metabolic Panel: Recent Labs  Lab 11/05/22 1041 11/05/22 1155 11/06/22 0310  NA 137 135 133*  K 5.3* 5.3* 5.9*  CL 94*  --  93*  CO2 23  --  22  GLUCOSE 87  --  107*  BUN 55*  --  66*  CREATININE 9.93*  --  11.24*  CALCIUM 9.4  --  9.3  PHOS  --   --  9.0*   Liver Function Tests: Recent Labs  Lab 11/05/22 1041  AST 23  ALT 21  ALKPHOS 80  BILITOT 0.8  PROT 8.1  ALBUMIN 3.7   Recent Labs  Lab 11/05/22 1041  LIPASE 36   No results for input(s): "AMMONIA" in the last 168 hours. CBC: Recent Labs  Lab 11/05/22 1041 11/05/22 1155 11/06/22 0310  WBC 6.7  --  7.1  NEUTROABS 4.6  --   --   HGB 11.2* 12.2 10.6*  HCT 36.5 36.0 33.5*  MCV 95.3  --  96.0  PLT 96*  --  103*   Cardiac Enzymes: No results for input(s): "CKTOTAL", "CKMB", "CKMBINDEX", "TROPONINI" in the last 168 hours. CBG: Recent Labs  Lab 11/05/22 1102 11/05/22 1232  GLUCAP 69* 82    Studies/Results: DG Chest Port 1 View  Result Date: 11/05/2022 CLINICAL DATA:  CHF EXAM: PORTABLE CHEST 1 VIEW COMPARISON:  09/08/2022 FINDINGS: Enlarged cardiopericardial silhouette. Calcified aorta. Vascular congestion. No pneumothorax, effusion. No edema. Overlapping cardiac leads. Left IJ double-lumen catheter with tip along the right side of the heart. IMPRESSION: Enlarged cardiopericardial silhouette with vascular congestion. Stable large bore left IJ catheter Electronically Signed   By: Karen Kays M.D.   On: 11/05/2022 11:53   Medications:   aspirin EC  81 mg Oral Daily   atorvastatin  10 mg Oral Daily   dicyclomine  10 mg Oral TID   gabapentin  200 mg Oral Daily   heparin  5,000 Units Subcutaneous Q8H   levothyroxine  100 mcg Oral Q0600   midodrine  10 mg Oral Q M,W,F-2000   pantoprazole  40 mg Oral Daily

## 2022-11-06 NOTE — Progress Notes (Signed)
Subjective: Courtney Gray is a 52 y.o. female who has a PMHx of HTN, ESRD on iHD, NSTEMI, obesity, and DM who presents with generalized weakness and fatigue concerning for acute on chronic heart failure exacerbation   Endorses difficulty with sleeping. States that she has a dry cough, congestion, and snores during her sleep. She has been told in the past about pursuing sleep studies for concern of OSA but has been hesitant to proceed with one given her living situation. She denies SOB or CP   Objective: Vital signs in last 24 hours: Vitals:   11/06/22 0003 11/06/22 0424 11/06/22 0730 11/06/22 1150  BP: 126/68 (!) 109/96 98/84 (!) 96/38  Pulse: 71  75 (!) 52  Resp: 15 18 16 16   Temp: 98.6 F (37 C) 98 F (36.7 C) 98.3 F (36.8 C) 97.8 F (36.6 C)  TempSrc: Oral Oral Oral Oral  SpO2: 100%  100%   Weight: 123.2 kg 123.2 kg     Weight change:   Intake/Output Summary (Last 24 hours) at 11/06/2022 1259 Last data filed at 11/06/2022 0700 Gross per 24 hour  Intake 360 ml  Output --  Net 360 ml    Physical Exam: BP (!) 96/38 (BP Location: Left Arm)   Pulse (!) 52   Temp 97.8 F (36.6 C) (Oral)   Resp 16   Wt 123.2 kg   SpO2 100%   BMI 45.20 kg/m  General appearance: alert, awake, NAD, fatigued  Head: Normocephalic, without obvious abnormality, atraumatic Lungs: CTAB, no wheezing, rales, or rhonchi, normal WOB   Heart: RRR, S1, S2 normal, no m/r/g Abdomen:  soft, tenderness on palpation bilaterally, non-distended  Extremities: bilateral above the knee amputations   Lab Results:    Latest Ref Rng & Units 11/06/2022    3:10 AM 11/05/2022   11:55 AM 11/05/2022   10:41 AM  CBC  WBC 4.0 - 10.5 K/uL 7.1   6.7   Hemoglobin 12.0 - 15.0 g/dL 57.8  46.9  62.9   Hematocrit 36.0 - 46.0 % 33.5  36.0  36.5   Platelets 150 - 400 K/uL 103   96       Latest Ref Rng & Units 11/06/2022    3:10 AM 11/05/2022   11:55 AM 11/05/2022   10:41 AM  CMP  Glucose 70 - 99 mg/dL 528   87   BUN 6 -  20 mg/dL 66   55   Creatinine 4.13 - 1.00 mg/dL 24.40   1.02   Sodium 725 - 145 mmol/L 133  135  137   Potassium 3.5 - 5.1 mmol/L 5.9  5.3  5.3   Chloride 98 - 111 mmol/L 93   94   CO2 22 - 32 mmol/L 22   23   Calcium 8.9 - 10.3 mg/dL 9.3   9.4   Total Protein 6.5 - 8.1 g/dL   8.1   Total Bilirubin 0.3 - 1.2 mg/dL   0.8   Alkaline Phos 38 - 126 U/L   80   AST 15 - 41 U/L   23   ALT 0 - 44 U/L   21    Recent Labs    11/05/22 1102 11/05/22 1232  GLUCAP 69* 82    Micro Results: Recent Results (from the past 240 hour(s))  Resp panel by RT-PCR (RSV, Flu A&B, Covid) Anterior Nasal Swab     Status: None   Collection Time: 11/05/22 10:41 AM   Specimen: Anterior Nasal Swab  Result Value  Ref Range Status   SARS Coronavirus 2 by RT PCR NEGATIVE NEGATIVE Final   Influenza A by PCR NEGATIVE NEGATIVE Final   Influenza B by PCR NEGATIVE NEGATIVE Final    Comment: (NOTE) The Xpert Xpress SARS-CoV-2/FLU/RSV plus assay is intended as an aid in the diagnosis of influenza from Nasopharyngeal swab specimens and should not be used as a sole basis for treatment. Nasal washings and aspirates are unacceptable for Xpert Xpress SARS-CoV-2/FLU/RSV testing.  Fact Sheet for Patients: BloggerCourse.com  Fact Sheet for Healthcare Providers: SeriousBroker.it  This test is not yet approved or cleared by the [place] FDA and has been authorized for detection and/or diagnosis of SARS-CoV-2 by FDA under an Emergency Use Authorization (EUA). This EUA will remain in effect (meaning this test can be used) for the duration of the COVID-19 declaration under Section 564(b)(1) of the Act, 21 U.S.C. section 360bbb-3(b)(1), unless the authorization is terminated or revoked.     Resp Syncytial Virus by PCR NEGATIVE NEGATIVE Final    Comment: (NOTE) Fact Sheet for Patients: BloggerCourse.com  Fact Sheet for Healthcare  Providers: SeriousBroker.it  This test is not yet approved or cleared by the [place] FDA and has been authorized for detection and/or diagnosis of SARS-CoV-2 by FDA under an Emergency Use Authorization (EUA). This EUA will remain in effect (meaning this test can be used) for the duration of the COVID-19 declaration under Section 564(b)(1) of the Act, 21 U.S.C. section 360bbb-3(b)(1), unless the authorization is terminated or revoked.  Performed at [facility], [address], [place], [place] [address]   Culture, blood (Routine X 2) w Reflex to ID Panel     Status: None (Preliminary result)   Collection Time: 11/05/22  4:13 PM   Specimen: BLOOD  Result Value Ref Range Status   Specimen Description BLOOD LEFT UPPER HAND  Final   Special Requests   Final    BOTTLES DRAWN AEROBIC ONLY Blood Culture adequate volume   Culture   Final    NO GROWTH < 12 HOURS Performed at [facility], [address] [place], [place] [address]    Report Status PENDING  Incomplete  Culture, blood (Routine X 2) w Reflex to ID Panel     Status: None (Preliminary result)   Collection Time: 11/05/22  6:30 PM   Specimen: BLOOD LEFT HAND  Result Value Ref Range Status   Specimen Description BLOOD LEFT HAND UPPER LOWER  Final   Special Requests   Final    BOTTLES DRAWN AEROBIC AND ANAEROBIC Blood Culture results may not be optimal due to an excessive volume of blood received in culture bottles   Culture   Final    NO GROWTH < 12 HOURS Performed at [facility], [address], [place], [place] [address]    Report Status PENDING  Incomplete    Studies/Results: DG Chest Port 1 View  Result Date: 11/05/2022 CLINICAL DATA:  CHF EXAM: PORTABLE CHEST 1 VIEW COMPARISON:  09/08/2022 FINDINGS: Enlarged cardiopericardial silhouette. Calcified aorta. Vascular congestion. No pneumothorax, effusion. No edema. Overlapping cardiac leads. Left IJ double-lumen catheter  with tip along the right side of the heart. IMPRESSION: Enlarged cardiopericardial silhouette with vascular congestion. Stable large bore left IJ catheter Electronically Signed   By: Karen Kays M.D.   On: 11/05/2022 11:53    Medications: I have reviewed the patient's current medications. Scheduled Meds:  aspirin EC  81 mg Oral Daily   atorvastatin  10 mg  Oral Daily   dicyclomine  10 mg Oral TID   gabapentin  200 mg Oral Daily   heparin  5,000 Units Subcutaneous Q8H   heparin sodium (porcine)       levothyroxine  100 mcg Oral Q0600   midodrine  10 mg Oral Q M,W,F-2000   pantoprazole  40 mg Oral Daily   sucroferric oxyhydroxide  1,000 mg Oral TID WC   Continuous Infusions:  [START ON 11/07/2022] sodium thiosulfate 25 g in sodium chloride 0.9 % 200 mL Infusion for Calciphylaxis     PRN Meds:.cyclobenzaprine, diphenhydrAMINE, heparin sodium (porcine), oxyCODONE-acetaminophen  Assessment/Plan: Principal Problem:   Acute exacerbation of CHF (congestive heart failure) (HCC) Active Problems:   ESRD on dialysis (HCC)   Chronic respiratory failure with hypoxia (HCC)   Elevated troponin level not due to acute coronary syndrome   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HCC)   Generalized weakness   Elevated troponin   Courtney Gray is a 52 y.o. female with a PMHx of HTN, ESRD on iHD, NSTEMI, obesity and DM who presents with generalized weakness and fatigue concerning for acute on chronic heart failure exacerbation   Active Problems   #ESRD  H/o dialysis  #Hyperkalemia #Chronic anemia  Dialysis schedule MWF. K 5.9. Cr 11.24. eGFR 4. Phos 9. Unable to go for dialysis yesterday 7/31, so will proceed with afternoon dialysis today 8/1. Hb 10.6    -Nephrology consulted, appreciate recs -Proceed with dialysis -Cont midodrine 10mg  during dialysis days  -Follow blood cultures - no growth at 12 hrs     #Acute on chronic diastolic congestive heart failure exacerbation  #Elevated  troponins  H/o NSTEMI  Risk factors include prior NSTEMI (05/2021), DM, HTN, obesity, and renal failure (controlled with HD).  Multiple risk factors for heart failure but iso recent diarrhea suspect possible viral GI infection or dehydration that led to acute heart failure exacerbation. Remains on 3L nasal cannula but denies SOB   -Cardiology consulted, appreciate recs -Cont bASA  -Tele  -Obtain ECHO  -Daily BMP, CBC  -Dialysis as reported above  -Continue nasal cannula, wean O2 as tolerated    #Generalized weakness #Fatigue Multiple possible etiologies that include dehydration from recent diarrhea, heart failure, anemia 2/2 ESRD, hypothyroidism, hypoglycemia, chronic respiratory failure with hypoxemia, and infection. TSH unremarkable. Patient endorse dry cough and snoring during nights. Has not had prior workup but history c/f OSA. May explain most recent difficulty with fatigue.    -Continue workup for other etiologies as stated above  -Recommend outpatient sleep study for OSA    #Abdominal discomfort #H/o calciphylaxis Endorses bilateral abdominal discomfort. Previously had ulcer in her left anterior abdominal wall. Recent hospital admission on 05/2022 noted open wound right flank was clean and dry and open left lower abdominal wall would that was mostly chronic and managed with dressings.     -Cont home dicyclomine 10mg  TID  -Flexeril 10mg  BID PRN -Oxycodone/Acetaminophen 5-325mg  QID PRN    Chronic Problems   #Hypothyroidism: Cont home levothyroxine 100 mcg p.o. daily   #T2DM: A1C 5.1 on June 2024. Diet controlled   #HLD: LDL 23 in 08/2021. Cont home atorvastatin 10 mg daily    #GERD: Cont home Protonix 40mg  daily    #Phantom leg pain: Takes Gabapentin and Lyrica at home. Will continue home gabapentin 200mg  daily and Lyrica 50mg  daily    #HTN: Previously prescribed ACEi. Not currently taking any home hypertensives. BP stable    #Chronic respiratory failure: uses 3-4L oxygen  at home  as needed. Currently on nasal cannula. Wean O2 as tolerated    #Peripheral artery disease: s/p bilateral AKA. Managed with ASA and atorvastatin as listed above     #Constipation: Takes Miralax daily at home. Hold for now iso recent diarrhea    DVT prophx: heparin  Diet: Renal Bowel: PRN Code: Full    Prior to Admission Living Arrangement: [facility] Anticipated Discharge Location: [facility]F - [facility]  Barriers to Discharge: Medical Workup   Dispo: Admit patient to Observation with expected length of stay less than 2 midnights.  This is a Psychologist, occupational Note.  The care of the patient was discussed with Dr. Mikey Bussing and the assessment and plan formulated with their assistance.  Please see their attached note for official documentation of the daily encounter.   LOS: 0 days   Loyal Buba 11/06/2022, 12:59 PM

## 2022-11-06 NOTE — Progress Notes (Signed)
Long discussion with patient about TTE results EF 30-35% severe MR. Discussed possible right and left heart Cath and TEE  Her overall prognosis is poor. 7 years on dialysis already, obesity, bilateral  AKA and hypotension requiring midodrine on dialysis   One could argue at least we need to r/o CAD. Her heart cath is not low risk Cannot use right arm with old fistula. Can consider left arm as I doubt there Are plans to do another fistula in left arm Catheterization from femoral artery And vein higher risk for retroperitoneal bleed.   She has severe MAC and at least by TTE would not appear to be ideal candidate For TEER. But again typically we do the TEE to make that decision. Certainly doing something to help forward cardiac output would improve her CHF/Fatigue and dyspnea. May also limit the hypotension during dialysis   GDMT for CHF really limited to beta blockers and controlling volume with dialysis Currently with low output state would not use beta blockers   Have asked Dr Gasper Lloyd to review chart to see if we should even offer catheterization and TEE   She is in assisted living She can get from bed to motorized wheel chair but really does not do much else except go to dialysis   Charlton Haws MD [facility]

## 2022-11-06 NOTE — Progress Notes (Signed)
Rounding Note    Patient Name: Courtney Gray Date of Encounter: 11/06/2022  [facility] Cardiologist: Dr. Eden Emms  Subjective   No acute overnight events. No new or worsening shortness of breath - she has chronic respiratory failure and is on 2-4L of O2 at home. Currently on 3L of O2 here. No chest pain. She is having some back pain due to the bed.  Inpatient Medications    Scheduled Meds:  aspirin EC  81 mg Oral Daily   atorvastatin  10 mg Oral Daily   dicyclomine  10 mg Oral TID   gabapentin  200 mg Oral Daily   heparin  5,000 Units Subcutaneous Q8H   levothyroxine  100 mcg Oral Q0600   midodrine  10 mg Oral Q M,W,F-2000   pantoprazole  40 mg Oral Daily   Continuous Infusions:  PRN Meds: cyclobenzaprine, diphenhydrAMINE, oxyCODONE-acetaminophen   Vital Signs    Vitals:   11/05/22 2300 11/05/22 2301 11/06/22 0003 11/06/22 0424  BP: 94/61  126/68 (!) 109/96  Pulse: 70  71   Resp: 17  15 18   Temp:  98.6 F (37 C) 98.6 F (37 C) 98 F (36.7 C)  TempSrc:  Oral Oral Oral  SpO2: 100%  100%   Weight:   123.2 kg 123.2 kg    Intake/Output Summary (Last 24 hours) at 11/06/2022 0739 Last data filed at 11/06/2022 0100 Gross per 24 hour  Intake 240 ml  Output --  Net 240 ml      11/06/2022    4:24 AM 11/06/2022   12:03 AM 09/09/2022    7:09 AM  Last 3 Weights  Weight (lbs) 271 lb 9.7 oz 271 lb 9.7 oz 257 lb  Weight (kg) 123.2 kg 123.2 kg 116.574 kg      Telemetry    Normal sinus rhythm with rates in the 70s. - Personally Reviewed  ECG    No new ECG tracing today. - Personally Reviewed  Physical Exam   GEN: Morbidly obese African-American. No acute distress.  On 3L of O2 via nasal cannula. Neck: JVD difficult to assess due to body habitus. Cardiac: RRR. III/VI systolic murmur. No rubs or gallops. Respiratory: Clear to auscultation bilaterally. No wheezes, rhonchi, or rales. GI: Soft, non-distended, and non-tender. MS: S/p bilateral above knee  amputations. Neuro:  No focal deficits. Psych: Normal affect. Responds appropriately.  Labs    High Sensitivity Troponin:   Recent Labs  Lab 11/05/22 1041 11/05/22 1241  TROPONINIHS 242* 239*     Chemistry Recent Labs  Lab 11/05/22 1041 11/05/22 1155 11/06/22 0310  NA 137 135 133*  K 5.3* 5.3* 5.9*  CL 94*  --  93*  CO2 23  --  22  GLUCOSE 87  --  107*  BUN 55*  --  66*  CREATININE 9.93*  --  11.24*  CALCIUM 9.4  --  9.3  PROT 8.1  --   --   ALBUMIN 3.7  --   --   AST 23  --   --   ALT 21  --   --   ALKPHOS 80  --   --   BILITOT 0.8  --   --   GFRNONAA 4*  --  4*  ANIONGAP 20*  --  18*    Lipids No results for input(s): "CHOL", "TRIG", "HDL", "LABVLDL", "LDLCALC", "CHOLHDL" in the last 168 hours.  Hematology Recent Labs  Lab 11/05/22 1041 11/05/22 1155 11/06/22 0310  WBC 6.7  --  7.1  RBC 3.83*  --  3.49*  HGB 11.2* 12.2 10.6*  HCT 36.5 36.0 33.5*  MCV 95.3  --  96.0  MCH 29.2  --  30.4  MCHC 30.7  --  31.6  RDW 15.9*  --  15.9*  PLT 96*  --  103*   Thyroid  Recent Labs  Lab 11/06/22 0310  TSH 4.008    BNP Recent Labs  Lab 11/05/22 1041  BNP 1,832.9*    DDimer No results for input(s): "DDIMER" in the last 168 hours.   Radiology    DG Chest Port 1 View  Result Date: 11/05/2022 CLINICAL DATA:  CHF EXAM: PORTABLE CHEST 1 VIEW COMPARISON:  09/08/2022 FINDINGS: Enlarged cardiopericardial silhouette. Calcified aorta. Vascular congestion. No pneumothorax, effusion. No edema. Overlapping cardiac leads. Left IJ double-lumen catheter with tip along the right side of the heart. IMPRESSION: Enlarged cardiopericardial silhouette with vascular congestion. Stable large bore left IJ catheter Electronically Signed   By: Karen Kays M.D.   On: 11/05/2022 11:53    Cardiac Studies   TTE 09/10/2021: Impressions:  1. Poor quality study. Globally LV function appears normal.   2. Left ventricular ejection fraction, by estimation, is 50 to 55%. The  left ventricle  has low normal function. Left ventricular endocardial  border not optimally defined to evaluate regional wall motion. There is  moderate concentric left ventricular  hypertrophy. Left ventricular diastolic parameters are consistent with  Grade II diastolic dysfunction (pseudonormalization).   3. Right ventricular systolic function was not well visualized. The right  ventricular size is not well visualized. There is mildly elevated  pulmonary artery systolic pressure.   4. Left atrial size was mild to moderately dilated.   5. The mitral valve is degenerative. Trivial mitral valve regurgitation.  No evidence of mitral stenosis.   6. Tricuspid valve regurgitation is mild to moderate.   7. The aortic valve is tricuspid. Aortic valve regurgitation is not  visualized. Mild aortic valve stenosis.   8. The inferior vena cava is normal in size with <50% respiratory  variability, suggesting right atrial pressure of 8 mmHg.  _______________   TEE 09/27/2021: Impression:  1. Dialysis catheter noted in SVC; calcified right atrial mass noted  (etiology unclear; possible calcified thrombus); if pt can tolerate,  cardiac MRI could provide more definitive evaluation.   2. Left ventricular ejection fraction, by estimation, is 65 to 70%. The  left ventricle has normal function. The left ventricle has no regional  wall motion abnormalities.   3. Right ventricular systolic function is normal. The right ventricular  size is normal.   4. Left atrial size was mildly dilated. No left atrial/left atrial  appendage thrombus was detected.   5. Right atrial size was mildly dilated.   6. The mitral valve is normal in structure. Mild mitral valve  regurgitation. Moderate mitral annular calcification.   7. Tricuspid valve regurgitation is moderate.   8. The aortic valve is tricuspid. Aortic valve regurgitation is not  visualized. Aortic valve sclerosis is present, with no evidence of aortic  valve stenosis.    9. There is mild (Grade II) plaque involving the descending aorta.  10. Evidence of atrial level shunting detected by color flow Doppler.  Agitated saline contrast bubble study was positive with shunting observed  within 3-6 cardiac cycles suggestive of interatrial shunt.    Patient Profile     52 y.o. female with a history of chronic HFpEF, PAD with ischemic leg s/p bilateral above knee  amputation (right in 08/2021 and left in 10/2021), chronic hypoxic respiratory failure on 3-4 L of O2 at home, splenic infarction secondary to septic embolus, hypertension, hyperlipidemia, type 2 diabetes mellitus, ESRD on hemodialysis, hypothyroidism, and gout presented on 11/05/2022 with generalized weakness. Cardiology was consulted for elevated troponin.  Assessment & Plan   Elevated Troponin Patient presented with generalized weakness and was found to have an elevated troponin. High-sensitivity troponin 242 >> 239. No acute ischemic EKG changes. - No chest pain. - Echo pending. - Troponin elevation may be secondary to ESRD. However, she does have multiple CV risk factors including PAD. She is not a candidate for non-invasive risk stratification and currently not felt to have an indication for aggressive invasive cardiac catheterization. However, will wait for Echo results. If EF is down or there are new wall motion abnormalities, may need to consider cardiac catheterization.  Acute on Chronic HFpEF Last Echo in 09/2021 showed LVEF of 50-55% with moderate LVH and grade 2 diastolic dysfunction. BNP elevated at 1,832 this admission. Chest x-ray showed enlarged cardiopericardial silhouette with vascular congestion. - Volume status difficult to assess due to morbid obesity but does not appear significantly volume overloaded. - Volume status managed via dialysis.  Possible Calcified Thrombus on Dialysis Catheter TEE in 09/2021 showed a calcified right atrial mass. Etiology was unclear but possible calcified  thrombus. 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 Dialysis catheterization was replaced at that time. Blood cultures this admission negative so far.  Hypertension BP soft at times but stable. - Continue Midodrine on dialysis days.  Hyperlipidemia LDL 23 in 08/2021. - Continue home Lipitor 10mg  daily.  ESRD On hemodialysis M/W/F. - Management per Nephrology.  Hyperkalemia Potassium 5.9 today. - Given ESRD, will defer management to Nephrology and primary team.  Otherwise, per primary team: - Type 2 diabetes mellitus - Chronic anemia - Abdominal pain - History of calciphylaxis  - Hypothyroidism - PAD s/p bilateral AKA with phantom leg pain - Chronic hypoxic respiratory failure on 3-4L of home O2   For questions or updates, please contact [facility] Please consult [email or web address] for contact info under        Signed, Corrin Parker, PA-C  11/06/2022, 7:39 AM

## 2022-11-07 DIAGNOSIS — Z89612 Acquired absence of left leg above knee: Secondary | ICD-10-CM | POA: Diagnosis not present

## 2022-11-07 DIAGNOSIS — E1151 Type 2 diabetes mellitus with diabetic peripheral angiopathy without gangrene: Secondary | ICD-10-CM | POA: Diagnosis present

## 2022-11-07 DIAGNOSIS — E860 Dehydration: Secondary | ICD-10-CM | POA: Diagnosis present

## 2022-11-07 DIAGNOSIS — E039 Hypothyroidism, unspecified: Secondary | ICD-10-CM | POA: Diagnosis present

## 2022-11-07 DIAGNOSIS — R7989 Other specified abnormal findings of blood chemistry: Secondary | ICD-10-CM | POA: Diagnosis not present

## 2022-11-07 DIAGNOSIS — E7800 Pure hypercholesterolemia, unspecified: Secondary | ICD-10-CM | POA: Diagnosis present

## 2022-11-07 DIAGNOSIS — D631 Anemia in chronic kidney disease: Secondary | ICD-10-CM | POA: Diagnosis present

## 2022-11-07 DIAGNOSIS — I132 Hypertensive heart and chronic kidney disease with heart failure and with stage 5 chronic kidney disease, or end stage renal disease: Secondary | ICD-10-CM | POA: Diagnosis present

## 2022-11-07 DIAGNOSIS — I953 Hypotension of hemodialysis: Secondary | ICD-10-CM | POA: Diagnosis present

## 2022-11-07 DIAGNOSIS — J9611 Chronic respiratory failure with hypoxia: Secondary | ICD-10-CM | POA: Diagnosis present

## 2022-11-07 DIAGNOSIS — G546 Phantom limb syndrome with pain: Secondary | ICD-10-CM | POA: Diagnosis present

## 2022-11-07 DIAGNOSIS — I318 Other specified diseases of pericardium: Secondary | ICD-10-CM | POA: Diagnosis not present

## 2022-11-07 DIAGNOSIS — I340 Nonrheumatic mitral (valve) insufficiency: Secondary | ICD-10-CM | POA: Diagnosis present

## 2022-11-07 DIAGNOSIS — L89314 Pressure ulcer of right buttock, stage 4: Secondary | ICD-10-CM | POA: Diagnosis present

## 2022-11-07 DIAGNOSIS — N2581 Secondary hyperparathyroidism of renal origin: Secondary | ICD-10-CM | POA: Diagnosis present

## 2022-11-07 DIAGNOSIS — I70203 Unspecified atherosclerosis of native arteries of extremities, bilateral legs: Secondary | ICD-10-CM | POA: Diagnosis present

## 2022-11-07 DIAGNOSIS — D151 Benign neoplasm of heart: Secondary | ICD-10-CM | POA: Diagnosis not present

## 2022-11-07 DIAGNOSIS — I5043 Acute on chronic combined systolic (congestive) and diastolic (congestive) heart failure: Secondary | ICD-10-CM | POA: Diagnosis present

## 2022-11-07 DIAGNOSIS — R531 Weakness: Secondary | ICD-10-CM | POA: Diagnosis present

## 2022-11-07 DIAGNOSIS — N186 End stage renal disease: Secondary | ICD-10-CM | POA: Diagnosis not present

## 2022-11-07 DIAGNOSIS — Z6841 Body Mass Index (BMI) 40.0 and over, adult: Secondary | ICD-10-CM | POA: Diagnosis not present

## 2022-11-07 DIAGNOSIS — Z89611 Acquired absence of right leg above knee: Secondary | ICD-10-CM

## 2022-11-07 DIAGNOSIS — E1122 Type 2 diabetes mellitus with diabetic chronic kidney disease: Secondary | ICD-10-CM | POA: Diagnosis present

## 2022-11-07 DIAGNOSIS — Z992 Dependence on renal dialysis: Secondary | ICD-10-CM | POA: Diagnosis not present

## 2022-11-07 DIAGNOSIS — I5189 Other ill-defined heart diseases: Secondary | ICD-10-CM | POA: Diagnosis not present

## 2022-11-07 DIAGNOSIS — E875 Hyperkalemia: Secondary | ICD-10-CM | POA: Diagnosis present

## 2022-11-07 DIAGNOSIS — Z1152 Encounter for screening for COVID-19: Secondary | ICD-10-CM | POA: Diagnosis not present

## 2022-11-07 MED ORDER — POLYETHYLENE GLYCOL 3350 17 G PO PACK: 17.0000 g | PACK | Freq: Every day | ORAL | Status: DC | PRN

## 2022-11-07 MED ORDER — MELATONIN 3 MG PO TABS
3.0000 mg | ORAL_TABLET | Freq: Every day | ORAL | Status: DC
  Administered 2022-11-08: 3 mg via ORAL
  Filled 2022-11-07 (×2): qty 1

## 2022-11-07 MED ORDER — SODIUM THIOSULFATE 250 MG/ML IV SOLN
25.0000 g | Freq: Once | INTRAVENOUS | Status: AC
  Administered 2022-11-08: 25 g via INTRAVENOUS
  Filled 2022-11-07: qty 100

## 2022-11-07 MED ORDER — OXYCODONE HCL 5 MG PO TABS
5.0000 mg | ORAL_TABLET | ORAL | Status: DC | PRN
  Administered 2022-11-07 – 2022-11-13 (×14): 5 mg via ORAL
  Filled 2022-11-07 (×14): qty 1

## 2022-11-07 NOTE — Progress Notes (Signed)
Heart Failure Navigator Progress Note  Assessed for Heart & Vascular TOC clinic readiness.  Patient does not meet criteria due to ESRD on HD.    [place], PharmD, BCPS Heart Failure Stewardship Pharmacist Phone [phone]    

## 2022-11-07 NOTE — Progress Notes (Signed)
RE: Dialysis  Upon report with floor nurse, patient refused dialysis today and wants to do dialysis Saturday.  Stacie Glaze LPN, Dialysis floor

## 2022-11-07 NOTE — Progress Notes (Signed)
Rounding Note    Patient Name: Georga Stys Date of Encounter: 11/07/2022  [facility] Cardiologist: None   Subjective   No acute overnight events. She has chronic respiratory failure and is on O2 at home but denies any new or worsening shortness of breath. No chest pain. Tentative plan is for [facility] on 8/5 and TEE on 8/6. Talked with patient and daughter in great detail about both of these procedures.   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     PRN Meds: cyclobenzaprine, diphenhydrAMINE, oxyCODONE-acetaminophen   Vital Signs    Vitals:   11/06/22 1949 11/06/22 2350 11/07/22 0003 11/07/22 0412  BP: 102/60  139/70 (!) 135/46  Pulse:      Resp: 18  15 18   Temp: 98.8 F (37.1 C)  98.4 F (36.9 C) 98.1 F (36.7 C)  TempSrc: Oral  Oral Oral  SpO2:      Weight:    121.2 kg  Height:  5\' 5"  (1.651 m)      Intake/Output Summary (Last 24 hours) at 11/07/2022 0839 Last data filed at 11/06/2022 1853 Gross per 24 hour  Intake 120 ml  Output 3000 ml  Net -2880 ml      11/07/2022    4:12 AM 11/06/2022    6:53 PM 11/06/2022    1:43 PM  Last 3 Weights  Weight (lbs) 267 lb 3.2 oz 267 lb 3.2 oz 271 lb 9.7 oz  Weight (kg) 121.2 kg 121.2 kg 123.2 kg      Telemetry    Normal sinus rhythm with rates in the 70s to 80s. - Personally Reviewed  ECG    No new ECG tracing today. - Personally Reviewed  Physical Exam   GEN: Morbidly obese African-American. No acute distress.  On 3L of O2 via nasal cannula. Neck: JVD difficult to assess due to  body habitus. Cardiac: RRR. II/VI systolic murmur. No rubs or gallops. Respiratory: Clear to auscultation bilaterally. No wheezes, rhonchi, or rales. GI: Soft, non-distended, and non-tender. MS: S/p bilateral above knee amputations. Neuro:  No focal deficits. Psych: Normal affect. Responds appropriately.  Labs    High Sensitivity Troponin:   Recent Labs  Lab 11/05/22 1041 11/05/22 1241  TROPONINIHS 242* 239*     Chemistry Recent Labs  Lab 11/05/22 1041 11/05/22 1155 11/06/22 0310 11/07/22 0703  NA 137 135 133* 134*  K 5.3* 5.3* 5.9* 4.7  CL 94*  --  93* 94*  CO2 23  --  22 26  GLUCOSE 87  --  107* 106*  BUN 55*  --  66* 37*  CREATININE 9.93*  --  11.24* 7.45*  CALCIUM 9.4  --  9.3 9.7  PROT 8.1  --   --   --   ALBUMIN 3.7  --   --  3.6  AST 23  --   --   --   ALT 21  --   --   --  ALKPHOS 80  --   --   --   BILITOT 0.8  --   --   --   GFRNONAA 4*  --  4* 6*  ANIONGAP 20*  --  18* 14    Lipids No results for input(s): "CHOL", "TRIG", "HDL", "LABVLDL", "LDLCALC", "CHOLHDL" in the last 168 hours.  Hematology Recent Labs  Lab 11/05/22 1041 11/05/22 1155 11/06/22 0310 11/07/22 0703  WBC 6.7  --  7.1 5.7  RBC 3.83*  --  3.49* 3.65*  HGB 11.2* 12.2 10.6* 10.7*  HCT 36.5 36.0 33.5* 35.1*  MCV 95.3  --  96.0 96.2  MCH 29.2  --  30.4 29.3  MCHC 30.7  --  31.6 30.5  RDW 15.9*  --  15.9* 15.6*  PLT 96*  --  103* 99*   Thyroid  Recent Labs  Lab 11/06/22 0310  TSH 4.008    BNP Recent Labs  Lab 11/05/22 1041  BNP 1,832.9*    DDimer No results for input(s): "DDIMER" in the last 168 hours.   Radiology    ECHOCARDIOGRAM COMPLETE  Result Date: 11/06/2022    ECHOCARDIOGRAM REPORT   Patient Name:   RHILEE CURRIN Date of Exam: 11/06/2022 Medical Rec #:  [id number]       Height:       65.0 in Accession #:    [id number]      Weight:       271.6 lb Date of Birth:  03/03/1971        BSA:          2.253 m Patient Age:    52 years        BP:           98/84 mmHg Patient  Gender: F               HR:           80 bpm. Exam Location:  Inpatient Procedure: 2D Echo, 3D Echo, Cardiac Doppler and Color Doppler Indications:    I50.40* Unspecified combined systolic (congestive) and diastolic                 (congestive) heart failure  History:        Patient has prior history of Echocardiogram examinations, most                 recent 09/27/2021. CHF, Previous Myocardial Infarction and CAD,                 Signs/Symptoms:Shortness of Breath and Dyspnea; Risk                 Factors:Hypertension. ESRD. PFO. RA mass.  Sonographer:    Sheralyn Boatman RDCS Referring Phys: [id number] ERIK C HOFFMAN  Sonographer Comments: Patient is obese. Image acquisition challenging due to patient body habitus. Definity attempted, patient has no working IV. Delayed 15 minutes to position patient. IMPRESSIONS  1. Left ventricular ejection fraction, by estimation, is 30 to 35%. The left ventricle has moderately decreased function. The left ventricle demonstrates global hypokinesis. The left ventricular internal cavity size was moderately dilated. There is moderate concentric left ventricular hypertrophy. Left ventricular diastolic parameters are consistent with Grade II diastolic dysfunction (pseudonormalization).  2. Right ventricular systolic function is low normal. The right ventricular size is not well visualized. There is moderately elevated pulmonary artery systolic pressure.  3. Echodense area visualized in several views but not well seen. Cannot exclude mass. Left atrial size was moderately dilated.  4. Right atrial size was moderately dilated.  5. The mitral valve is degenerative. Severe mitral valve regurgitation. Severe mitral annular calcification.  6. Tricuspid valve regurgitation is mild to moderate.  7. The aortic valve is grossly normal. There is moderate calcification of the aortic valve. There is moderate thickening of the aortic valve. Aortic valve regurgitation is trivial. Mild aortic valve stenosis.  Aortic valve mean gradient measures 11.5 mmHg.  8. The inferior vena cava is dilated in size with >50% respiratory variability, suggesting right atrial pressure of 8 mmHg. Comparison(s): Changes from prior study are noted. LV dilated, EF reduced with severe MR. Left atrium with echodensity, cannot exclude mass. Conclusion(s)/Recommendation(s): Findings communicated with primary inpatient team and cardiology team. FINDINGS  Left Ventricle: Left ventricular ejection fraction, by estimation, is 30 to 35%. The left ventricle has moderately decreased function. The left ventricle demonstrates global hypokinesis. The left ventricular internal cavity size was moderately dilated. There is moderate concentric left ventricular hypertrophy. Left ventricular diastolic parameters are consistent with Grade II diastolic dysfunction (pseudonormalization). Right Ventricle: The right ventricular size is not well visualized. Right vetricular wall thickness was not well visualized. Right ventricular systolic function is low normal. There is moderately elevated pulmonary artery systolic pressure. The tricuspid  regurgitant velocity is 3.08 m/s, and with an assumed right atrial pressure of 8 mmHg, the estimated right ventricular systolic pressure is 45.9 mmHg. Left Atrium: Echodense area visualized in several views but not well seen. Cannot exclude mass. Left atrial size was moderately dilated. Right Atrium: Right atrial size was moderately dilated. Pericardium: There is no evidence of pericardial effusion. Mitral Valve: ERO 0.62, Rvol 120 ml. The mitral valve is degenerative in appearance. There is mild thickening of the mitral valve leaflet(s). There is mild calcification of the mitral valve leaflet(s). Severe mitral annular calcification. Severe mitral valve regurgitation, with eccentric posteriorly directed jet. MV peak gradient, 13.7 mmHg. The mean mitral valve gradient is 6.0 mmHg. Tricuspid Valve: The tricuspid valve is not well  visualized. Tricuspid valve regurgitation is mild to moderate. No evidence of tricuspid stenosis. Aortic Valve: The aortic valve is grossly normal. There is moderate calcification of the aortic valve. There is moderate thickening of the aortic valve. Aortic valve regurgitation is trivial. Mild aortic stenosis is present. Aortic valve mean gradient measures 11.5 mmHg. Aortic valve peak gradient measures 21.1 mmHg. Aortic valve area, by VTI measures 1.80 cm. Pulmonic Valve: The pulmonic valve was not well visualized. Pulmonic valve regurgitation is trivial. No evidence of pulmonic stenosis. Aorta: The aortic root and ascending aorta are structurally normal, with no evidence of dilitation. Venous: The inferior vena cava is dilated in size with greater than 50% respiratory variability, suggesting right atrial pressure of 8 mmHg. IAS/Shunts: The interatrial septum was not well visualized.  LEFT VENTRICLE PLAX 2D LVIDd:         5.80 cm      Diastology LVIDs:         4.80 cm      LV e' medial:    5.22 cm/s LV PW:         1.30 cm      LV E/e' medial:  31.4 LV IVS:        1.30 cm      LV e' lateral:   8.70 cm/s LVOT diam:     2.20 cm      LV E/e' lateral: 18.9 LV SV:         77 LV SV Index:  34 LVOT Area:     3.80 cm  LV Volumes (MOD) LV vol d, MOD A2C: 274.0 ml LV vol d, MOD A4C: 260.0 ml LV vol s, MOD A2C: 181.0 ml LV vol s, MOD A4C: 181.0 ml LV SV MOD A2C:     93.0 ml LV SV MOD A4C:     260.0 ml LV SV MOD BP:      87.7 ml RIGHT VENTRICLE            IVC RV S prime:     9.79 cm/s  IVC diam: 2.00 cm TAPSE (M-mode): 1.6 cm LEFT ATRIUM              Index        RIGHT ATRIUM           Index LA diam:        3.00 cm  1.33 cm/m   RA Area:     23.60 cm LA Vol (A2C):   86.4 ml  38.35 ml/m  RA Volume:   82.20 ml  36.49 ml/m LA Vol (A4C):   120.0 ml 53.26 ml/m LA Biplane Vol: 103.0 ml 45.72 ml/m  AORTIC VALVE                     PULMONIC VALVE AV Area (Vmax):    1.89 cm      PR End Diast Vel: 1.96 msec AV Area (Vmean):    1.84 cm AV Area (VTI):     1.80 cm AV Vmax:           229.50 cm/s AV Vmean:          154.000 cm/s AV VTI:            0.430 m AV Peak Grad:      21.1 mmHg AV Mean Grad:      11.5 mmHg LVOT Vmax:         114.00 cm/s LVOT Vmean:        74.400 cm/s LVOT VTI:          0.203 m LVOT/AV VTI ratio: 0.47  AORTA Ao Root diam: 2.80 cm Ao Asc diam:  3.50 cm MITRAL VALVE                  TRICUSPID VALVE MV Area (PHT): 4.68 cm       TR Peak grad:   37.9 mmHg MV Area VTI:   1.75 cm       TR Vmax:        308.00 cm/s MV Peak grad:  13.7 mmHg MV Mean grad:  6.0 mmHg       SHUNTS MV Vmax:       1.85 m/s       Systemic VTI:  0.20 m MV Vmean:      114.0 cm/s     Systemic Diam: 2.20 cm MV Decel Time: 162 msec MR Peak grad:    114.9 mmHg MR Mean grad:    77.0 mmHg MR Vmax:         536.00 cm/s MR Vmean:        418.5 cm/s MR PISA:         9.05 cm MR PISA Eff ROA: 62 mm MR PISA Radius:  1.20 cm MV E velocity: 164.00 cm/s MV A velocity: 90.30 cm/s MV E/A ratio:  1.82 Jodelle Red MD Electronically signed by Jodelle Red MD Signature Date/Time: 11/06/2022/2:36:33 PM    Final    DG  Chest Port 1 View  Result Date: 11/05/2022 CLINICAL DATA:  CHF EXAM: PORTABLE CHEST 1 VIEW COMPARISON:  09/08/2022 FINDINGS: Enlarged cardiopericardial silhouette. Calcified aorta. Vascular congestion. No pneumothorax, effusion. No edema. Overlapping cardiac leads. Left IJ double-lumen catheter with tip along the right side of the heart. IMPRESSION: Enlarged cardiopericardial silhouette with vascular congestion. Stable large bore left IJ catheter Electronically Signed   By: Karen Kays M.D.   On: 11/05/2022 11:53    Cardiac Studies   TTE 09/10/2021: Impressions:  1. Poor quality study. Globally LV function appears normal.   2. Left ventricular ejection fraction, by estimation, is 50 to 55%. The  left ventricle has low normal function. Left ventricular endocardial  border not optimally defined to evaluate regional wall motion. There is   moderate concentric left ventricular  hypertrophy. Left ventricular diastolic parameters are consistent with  Grade II diastolic dysfunction (pseudonormalization).   3. Right ventricular systolic function was not well visualized. The right  ventricular size is not well visualized. There is mildly elevated  pulmonary artery systolic pressure.   4. Left atrial size was mild to moderately dilated.   5. The mitral valve is degenerative. Trivial mitral valve regurgitation.  No evidence of mitral stenosis.   6. Tricuspid valve regurgitation is mild to moderate.   7. The aortic valve is tricuspid. Aortic valve regurgitation is not  visualized. Mild aortic valve stenosis.   8. The inferior vena cava is normal in size with <50% respiratory  variability, suggesting right atrial pressure of 8 mmHg.  _______________     TEE 09/27/2021: Impression:  1. Dialysis catheter noted in SVC; calcified right atrial mass noted  (etiology unclear; possible calcified thrombus); if pt can tolerate,  cardiac MRI could provide more definitive evaluation.   2. Left ventricular ejection fraction, by estimation, is 65 to 70%. The  left ventricle has normal function. The left ventricle has no regional  wall motion abnormalities.   3. Right ventricular systolic function is normal. The right ventricular  size is normal.   4. Left atrial size was mildly dilated. No left atrial/left atrial  appendage thrombus was detected.   5. Right atrial size was mildly dilated.   6. The mitral valve is normal in structure. Mild mitral valve  regurgitation. Moderate mitral annular calcification.   7. Tricuspid valve regurgitation is moderate.   8. The aortic valve is tricuspid. Aortic valve regurgitation is not  visualized. Aortic valve sclerosis is present, with no evidence of aortic  valve stenosis.   9. There is mild (Grade II) plaque involving the descending aorta.  10. Evidence of atrial level shunting detected by color  flow Doppler.  Agitated saline contrast bubble study was positive with shunting observed  within 3-6 cardiac cycles suggestive of interatrial shunt.  _______________  TTE 11/06/2022: 1. Left ventricular ejection fraction, by estimation, is 30 to 35%. The  left ventricle has moderately decreased function. The left ventricle  demonstrates global hypokinesis. The left ventricular internal cavity size  was moderately dilated. There is  moderate concentric left ventricular hypertrophy. Left ventricular  diastolic parameters are consistent with Grade II diastolic dysfunction  (pseudonormalization).   2. Right ventricular systolic function is low normal. The right  ventricular size is not well visualized. There is moderately elevated  pulmonary artery systolic pressure.   3. Echodense area visualized in several views but not well seen. Cannot  exclude mass. Left atrial size was moderately dilated.   4. Right atrial size was moderately dilated.  5. The mitral valve is degenerative. Severe mitral valve regurgitation.  Severe mitral annular calcification.   6. Tricuspid valve regurgitation is mild to moderate.   7. The aortic valve is grossly normal. There is moderate calcification of  the aortic valve. There is moderate thickening of the aortic valve. Aortic  valve regurgitation is trivial. Mild aortic valve stenosis. Aortic valve  mean gradient measures 11.5  mmHg.   8. The inferior vena cava is dilated in size with >50% respiratory  variability, suggesting right atrial pressure of 8 mmHg    Patient Profile     52 y.o. 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 Cardiology was consulted for elevated troponin.   Assessment & Plan    Elevated Troponin Patient presented with generalized weakness and was found to have an elevated troponin. High-sensitivity troponin 242 >> 239. No acute ischemic EKG changes. Echo showed a drop in LVEF to 30-35% with global hypokinesis. - No chest pain. - Troponin elevation may be secondary to ESRD. However, she does have multiple CV risk factors including PAD. Given drop in EF, tentative plan is to proceed with [facility] on Monday with Dr. Gala Romney.   Acute on Chronic Combined CHF with Newly Reduced EF Last Echo in 09/2021 showed LVEF of 50-55% with moderate LVH and grade 2 diastolic dysfunction. BNP elevated at 1,832 this admission. Chest x-ray showed enlarged cardiopericardial silhouette with vascular congestion. Echo showed LVEF of 30-35% with global hypokinesis, moderate LVH, and grade 2 diastolic dysfunction as well as low normal RV function. - Volume status difficult to assess due to morbid obesity but does not appear significantly volume overloaded. - Volume status managed via dialysis. - GDMT limited due to ESRD and hypotension on dialysis days. Could consider adding a beta-blocker (Toprol-XL or Coreg) on non-dialysis days.   Possible Calcified Thrombus on Dialysis Catheter Echodense Area in Left Atrium TEE in 09/2021 showed a calcified right atrial mass. Etiology was unclear but possible calcified thrombus. 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 Dialysis catheterization was replaced at that time. TTE this admission did not reveal a mass in the right atrium but did show an echodense area in the left atrium (mass cannot be excluded. Blood cultures this admission negative so far. - Plan is for TEE on Tuesday.  Severe Mitral Regurgitation Noted on Echo this admission. - Plan is for [facility] and TEE next week as  above.   Hypertension BP soft at times but stable. - Continue Midodrine on dialysis days.   Hyperlipidemia LDL 23 in 08/2021. - Continue home Lipitor 10mg  daily.   ESRD On hemodialysis M/W/F. - Management per Nephrology.   Hyperkalemia Potassium 5.9 yesterday. She received dialysis and potassium 4.7 today. - Given ESRD, will defer management to Nephrology and primary team.   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 Fera and daughter. Patient would like to think about it more before providing consent.  The risks [esophageal damage, perforation (1:10,000 risk), bleeding, pharyngeal hematoma as well as other potential complications associated with conscious sedation including aspiration, arrhythmia, respiratory failure and death], benefits (treatment guidance and diagnostic support) and alternatives of a transesophageal echocardiogram were discussed in detail with Ms. Mittelman and daughter. Patient would like to think about it more before providing consent.  Tentative plan is for [facility] on 8/5 and TEE on 8/6. I spoke with patient and daughter about both of these procedures in great detail. She is worried that she will not be able to lay flat for her heart cath given chronic back issues. She would like to think about this a little more before providing consent but states she is leaning towards a "strong yes." Will leave patient on the board  for both procedures and can follow-up on the weekend. Will need to place pre-procedural orders over the weekend if she consents. However, will go ahead an make her NPO at midnight on 8/5.             For questions or updates, please contact [facility] Please consult [email or web address] for contact info under        Signed, Corrin Parker, PA-C  11/07/2022, 8:39 AM

## 2022-11-07 NOTE — Progress Notes (Addendum)
Subjective:   Seen in room - tolerated HD yesterday with 3L UF fine.  Finished overnight - declining HD today (usually MWF) but will be in hospital through weekend so plan is Sat/Mon.    Objective Vital signs in last 24 hours: Vitals:   11/06/22 2350 11/07/22 0003 11/07/22 0412 11/07/22 0850  BP:  139/70 (!) 135/46 (!) 108/95  Pulse:    74  Resp:  15 18 19   Temp:  98.4 F (36.9 C) 98.1 F (36.7 C) 97.9 F (36.6 C)  TempSrc:  Oral Oral Oral  SpO2:    99%  Weight:   121.2 kg   Height: 5\' 5"  (1.651 m)      Weight change: 0 kg  Physical Exam: General: Alert  Obese, chronically ill appearing Female, NAD  Heart: RRR, 1/6/sem, no rg Lungs: CTA  but distant sounds  2/2 body habitus /  3 l  O2, nonlabored  Abdomen: Morbid obese, NABS, soft Extremities: Bilat AKA, no stump edema,  Dialysis Access: L  internal jugular TDC       OP Dialysis Orders: Center: [facility] MWF   . 4hrs EDW 119,  2k, 2ca bath  Hep 5000 units, L internal jugular TDC  HD Bath Mircera 50 mcg  (last given 11/03/22) Venofer 50 mg  w wkly hd    Hectorol 4 mcg   Sodium thiosulfate 12.5g post HD   Assessment/Plan Weakness=  now thought to be cardiac related --> EF 30-35%, severe MVR.  Cardiology following and w/u underway - TEE and LHC next week.  Appreciate Dr. Fabio Bering thoughtful assessment yesterday.  ESRD -  HD MWF, had HD off schedule Thurs after missing Wed.  Decline HD today - plan next Sat then resume MWF m[place].   Chronic HF p EF = EF 30-35% cardiology following.   With severe MVR.  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 with dialysis  Courtney Bakes MD [facility] Pager [phone]   Labs: Basic Metabolic Panel: Recent Labs  Lab 11/05/22 1041 11/05/22 1155 11/06/22 0310 11/07/22 0703  NA 137 135 133* 134*  K 5.3* 5.3* 5.9* 4.7  CL 94*  --  93* 94*  CO2 23  --  22 26  GLUCOSE 87  --  107* 106*  BUN 55*  --  66* 37*  CREATININE 9.93*  --  11.24* 7.45*  CALCIUM 9.4  --  9.3 9.7  PHOS  --   --  9.0* 7.2*   Liver Function Tests: Recent Labs  Lab 11/05/22 1041 11/07/22 0703  AST 23  --   ALT 21  --   ALKPHOS 80  --   BILITOT 0.8  --   PROT 8.1  --   ALBUMIN 3.7 3.6   Recent Labs  Lab 11/05/22 1041  LIPASE 36   No results for input(s): "AMMONIA" in the last 168 hours. CBC:  Recent Labs  Lab 11/05/22 1041 11/05/22 1155 11/06/22 0310 11/07/22 0703  WBC 6.7  --  7.1 5.7  NEUTROABS 4.6  --   --  3.8  HGB 11.2* 12.2 10.6* 10.7*  HCT 36.5 36.0 33.5* 35.1*  MCV 95.3  --  96.0 96.2  PLT 96*  --  103* 99*   Cardiac Enzymes: No results for input(s): "CKTOTAL", "CKMB", "CKMBINDEX", "TROPONINI" in the last 168 hours. CBG: Recent Labs  Lab 11/05/22 1102 11/05/22 1232  GLUCAP 69* 82    Studies/Results: ECHOCARDIOGRAM COMPLETE  Result Date: 11/06/2022    ECHOCARDIOGRAM REPORT   Patient Name:   Courtney Gray Date of Exam: 11/06/2022 Medical Rec #:  [id number]       Height:       65.0 in Accession #:    [id number]      Weight:       271.6 lb Date of Birth:  [birth year]        BSA:          2.253 m Patient Age:    52 years        BP:           98/84 mmHg Patient Gender: F               HR:           80 bpm. Exam Location:  Inpatient Procedure: 2D Echo, 3D Echo, Cardiac Doppler and Color Doppler Indications:    I50.40* Unspecified combined systolic (congestive) and diastolic                 (congestive) heart failure  History:        Patient has prior history of Echocardiogram examinations, most                 recent 09/27/2021. CHF, Previous  Myocardial Infarction and CAD,                 Signs/Symptoms:Shortness of Breath and Dyspnea; Risk                 Factors:Hypertension. ESRD. PFO. RA mass.  Sonographer:    Sheralyn Boatman RDCS Referring Phys: [id number] ERIK C HOFFMAN  Sonographer Comments: Patient is obese. Image acquisition challenging due to patient body habitus. Definity attempted, patient has no working IV. Delayed 15 minutes to position patient. IMPRESSIONS  1. Left ventricular ejection fraction, by estimation, is 30 to 35%. The left ventricle has moderately decreased function. The left ventricle demonstrates global hypokinesis. The left ventricular internal cavity size was moderately dilated. There is moderate concentric left ventricular hypertrophy. Left ventricular diastolic parameters are consistent with Grade II diastolic dysfunction (pseudonormalization).  2. Right ventricular systolic function is low normal. The right ventricular size is not well visualized. There is moderately elevated pulmonary artery systolic pressure.  3. Echodense area visualized in several views but not well seen. Cannot exclude mass. Left atrial size was moderately dilated.  4. Right atrial size was moderately dilated.  5. The mitral valve is degenerative. Severe mitral valve regurgitation. Severe mitral annular calcification.  6. Tricuspid valve regurgitation is mild to moderate.  7. The aortic valve is grossly normal. There is moderate calcification of the aortic valve. There is moderate thickening of the aortic valve. Aortic valve regurgitation is trivial. Mild aortic valve stenosis. Aortic valve mean gradient measures 11.5 mmHg.  8. The inferior vena cava is dilated in size with >50% respiratory variability, suggesting right atrial pressure of 8 mmHg. Comparison(s):  Changes from prior study are noted. LV dilated, EF reduced with severe MR. Left atrium with echodensity, cannot exclude mass. Conclusion(s)/Recommendation(s): Findings communicated with primary inpatient  team and cardiology team. FINDINGS  Left Ventricle: Left ventricular ejection fraction, by estimation, is 30 to 35%. The left ventricle has moderately decreased function. The left ventricle demonstrates global hypokinesis. The left ventricular internal cavity size was moderately dilated. There is moderate concentric left ventricular hypertrophy. Left ventricular diastolic parameters are consistent with Grade II diastolic dysfunction (pseudonormalization). Right Ventricle: The right ventricular size is not well visualized. Right vetricular wall thickness was not well visualized. Right ventricular systolic function is low normal. There is moderately elevated pulmonary artery systolic pressure. The tricuspid  regurgitant velocity is 3.08 m/s, and with an assumed right atrial pressure of 8 mmHg, the estimated right ventricular systolic pressure is 45.9 mmHg. Left Atrium: Echodense area visualized in several views but not well seen. Cannot exclude mass. Left atrial size was moderately dilated. Right Atrium: Right atrial size was moderately dilated. Pericardium: There is no evidence of pericardial effusion. Mitral Valve: ERO 0.62, Rvol 120 ml. The mitral valve is degenerative in appearance. There is mild thickening of the mitral valve leaflet(s). There is mild calcification of the mitral valve leaflet(s). Severe mitral annular calcification. Severe mitral valve regurgitation, with eccentric posteriorly directed jet. MV peak gradient, 13.7 mmHg. The mean mitral valve gradient is 6.0 mmHg. Tricuspid Valve: The tricuspid valve is not well visualized. Tricuspid valve regurgitation is mild to moderate. No evidence of tricuspid stenosis. Aortic Valve: The aortic valve is grossly normal. There is moderate calcification of the aortic valve. There is moderate thickening of the aortic valve. Aortic valve regurgitation is trivial. Mild aortic stenosis is present. Aortic valve mean gradient measures 11.5 mmHg. Aortic valve peak  gradient measures 21.1 mmHg. Aortic valve area, by VTI measures 1.80 cm. Pulmonic Valve: The pulmonic valve was not well visualized. Pulmonic valve regurgitation is trivial. No evidence of pulmonic stenosis. Aorta: The aortic root and ascending aorta are structurally normal, with no evidence of dilitation. Venous: The inferior vena cava is dilated in size with greater than 50% respiratory variability, suggesting right atrial pressure of 8 mmHg. IAS/Shunts: The interatrial septum was not well visualized.  LEFT VENTRICLE PLAX 2D LVIDd:         5.80 cm      Diastology LVIDs:         4.80 cm      LV e' medial:    5.22 cm/s LV PW:         1.30 cm      LV E/e' medial:  31.4 LV IVS:        1.30 cm      LV e' lateral:   8.70 cm/s LVOT diam:     2.20 cm      LV E/e' lateral: 18.9 LV SV:         77 LV SV Index:   34 LVOT Area:     3.80 cm  LV Volumes (MOD) LV vol d, MOD A2C: 274.0 ml LV vol d, MOD A4C: 260.0 ml LV vol s, MOD A2C: 181.0 ml LV vol s, MOD A4C: 181.0 ml LV SV MOD A2C:     93.0 ml LV SV MOD A4C:     260.0 ml LV SV MOD BP:      87.7 ml RIGHT VENTRICLE            IVC RV S prime:  9.79 cm/s  IVC diam: 2.00 cm TAPSE (M-mode): 1.6 cm LEFT ATRIUM              Index        RIGHT ATRIUM           Index LA diam:        3.00 cm  1.33 cm/m   RA Area:     23.60 cm LA Vol (A2C):   86.4 ml  38.35 ml/m  RA Volume:   82.20 ml  36.49 ml/m LA Vol (A4C):   120.0 ml 53.26 ml/m LA Biplane Vol: 103.0 ml 45.72 ml/m  AORTIC VALVE                     PULMONIC VALVE AV Area (Vmax):    1.89 cm      PR End Diast Vel: 1.96 msec AV Area (Vmean):   1.84 cm AV Area (VTI):     1.80 cm AV Vmax:           229.50 cm/s AV Vmean:          154.000 cm/s AV VTI:            0.430 m AV Peak Grad:      21.1 mmHg AV Mean Grad:      11.5 mmHg LVOT Vmax:         114.00 cm/s LVOT Vmean:        74.400 cm/s LVOT VTI:          0.203 m LVOT/AV VTI ratio: 0.47  AORTA Ao Root diam: 2.80 cm Ao Asc diam:  3.50 cm MITRAL VALVE                  TRICUSPID  VALVE MV Area (PHT): 4.68 cm       TR Peak grad:   37.9 mmHg MV Area VTI:   1.75 cm       TR Vmax:        308.00 cm/s MV Peak grad:  13.7 mmHg MV Mean grad:  6.0 mmHg       SHUNTS MV Vmax:       1.85 m/s       Systemic VTI:  0.20 m MV Vmean:      114.0 cm/s     Systemic Diam: 2.20 cm MV Decel Time: 162 msec MR Peak grad:    114.9 mmHg MR Mean grad:    77.0 mmHg MR Vmax:         536.00 cm/s MR Vmean:        418.5 cm/s MR PISA:         9.05 cm MR PISA Eff ROA: 62 mm MR PISA Radius:  1.20 cm MV E velocity: 164.00 cm/s MV A velocity: 90.30 cm/s MV E/A ratio:  1.82 Jodelle Red MD Electronically signed by Jodelle Red MD Signature Date/Time: 11/06/2022/2:36:33 PM    Final    Medications:  sodium thiosulfate 25 g in sodium chloride 0.9 % 200 mL Infusion for Calciphylaxis      aspirin EC  81 mg Oral Daily   atorvastatin  10 mg Oral Daily   dicyclomine  10 mg Oral TID   gabapentin  200 mg Oral Daily   heparin  5,000 Units Subcutaneous Q8H   levothyroxine  100 mcg Oral Q0600   midodrine  10 mg Oral Q M,W,F-2000   pantoprazole  40 mg Oral Daily   sucroferric oxyhydroxide  1,000 mg Oral TID  WC

## 2022-11-07 NOTE — Progress Notes (Signed)
Subjective: Courtney Gray is a 52 y.o. female with a PMHx of HTN, ESRD on iHD, NSTEMI, obesity and DM who presents with generalized weakness and fatigue concerning for acute on chronic heart failure exacerbation found to newly reduced EF (30-35%)  Went for dialysis yesterday 8/1. Endorses difficulty with sleeping with continued dry cough and congestion. Feeling anxious and overwhelmed by her situation.   Objective: Vital signs in last 24 hours: Vitals:   11/07/22 0003 11/07/22 0412 11/07/22 0850 11/07/22 1212  BP: 139/70 (!) 135/46 (!) 108/95 123/64  Pulse:   74 60  Resp: 15 18 19 18   Temp: 98.4 F (36.9 C) 98.1 F (36.7 C) 97.9 F (36.6 C) 97.7 F (36.5 C)  TempSrc: Oral Oral Oral Oral  SpO2:   99% 97%  Weight:  121.2 kg    Height:       Weight change: 0 kg  Intake/Output Summary (Last 24 hours) at 11/07/2022 1352 Last data filed at 11/07/2022 1214 Gross per 24 hour  Intake 0 ml  Output 3000 ml  Net -3000 ml   Physical Exam: BP 123/64 (BP Location: Left Wrist)   Pulse 60   Temp 97.7 F (36.5 C) (Oral)   Resp 18   Ht 5\' 5"  (1.651 m) Comment: AKA--stated  Wt 121.2 kg   SpO2 97%   BMI 44.46 kg/m  General appearance: tired, alert, NAD  Head: Normocephalic, without obvious abnormality, atraumatic Lungs: CTAB, normal WOB  Heart: RRR, S1, S2 normal, no m/r/g Abdomen:  soft, mild tenderness on palpation, non-distended  Extremities: bilateral above the knee amputations   Lab Results:    Latest Ref Rng & Units 11/07/2022    7:03 AM 11/06/2022    3:10 AM 11/05/2022   11:55 AM  CBC  WBC 4.0 - 10.5 K/uL 5.7  7.1    Hemoglobin 12.0 - 15.0 g/dL 16.1  09.6  04.5   Hematocrit 36.0 - 46.0 % 35.1  33.5  36.0   Platelets 150 - 400 K/uL 99  103        Latest Ref Rng & Units 11/07/2022    7:03 AM 11/06/2022    3:10 AM 11/05/2022   11:55 AM  CMP  Glucose 70 - 99 mg/dL 409  811    BUN 6 - 20 mg/dL 37  66    Creatinine 9.14 - 1.00 mg/dL 7.82  95.62    Sodium 130 - 145 mmol/L 134   133  135   Potassium 3.5 - 5.1 mmol/L 4.7  5.9  5.3   Chloride 98 - 111 mmol/L 94  93    CO2 22 - 32 mmol/L 26  22    Calcium 8.9 - 10.3 mg/dL 9.7  9.3     Recent Labs    11/05/22 1102 11/05/22 1232  GLUCAP 69* 82    Micro Results: Recent Results (from the past 240 hour(s))  Resp panel by RT-PCR (RSV, Flu A&B, Covid) Anterior Nasal Swab     Status: None   Collection Time: 11/05/22 10:41 AM   Specimen: Anterior Nasal Swab  Result Value Ref Range Status   SARS Coronavirus 2 by RT PCR NEGATIVE NEGATIVE Final   Influenza A by PCR NEGATIVE NEGATIVE Final   Influenza B by PCR NEGATIVE NEGATIVE Final    Comment: (NOTE) The Xpert Xpress SARS-CoV-2/FLU/RSV plus assay is intended as an aid in the diagnosis of influenza from Nasopharyngeal swab specimens and should not be used as a sole basis for treatment. Nasal washings  and aspirates are unacceptable for Xpert Xpress SARS-CoV-2/FLU/RSV testing.  Fact Sheet for Patients: BloggerCourse.com  Fact Sheet for Healthcare Providers: SeriousBroker.it  This test is not yet approved or cleared by the [place] FDA and has been authorized for detection and/or diagnosis of SARS-CoV-2 by FDA under an Emergency Use Authorization (EUA). This EUA will remain in effect (meaning this test can be used) for the duration of the COVID-19 declaration under Section 564(b)(1) of the Act, 21 U.S.C. section 360bbb-3(b)(1), unless the authorization is terminated or revoked.     Resp Syncytial Virus by PCR NEGATIVE NEGATIVE Final    Comment: (NOTE) Fact Sheet for Patients: BloggerCourse.com  Fact Sheet for Healthcare Providers: SeriousBroker.it  This test is not yet approved or cleared by the [place] FDA and has been authorized for detection and/or diagnosis of SARS-CoV-2 by FDA under an Emergency Use Authorization (EUA). This EUA will  remain in effect (meaning this test can be used) for the duration of the COVID-19 declaration under Section 564(b)(1) of the Act, 21 U.S.C. section 360bbb-3(b)(1), unless the authorization is terminated or revoked.  Performed at [facility], [address], [place], [place] [address]   Culture, blood (Routine X 2) w Reflex to ID Panel     Status: None (Preliminary result)   Collection Time: 11/05/22  4:13 PM   Specimen: BLOOD  Result Value Ref Range Status   Specimen Description BLOOD LEFT UPPER HAND  Final   Special Requests   Final    BOTTLES DRAWN AEROBIC ONLY Blood Culture adequate volume   Culture   Final    NO GROWTH 2 DAYS Performed at [facility], [address] [place], [place] [address]    Report Status PENDING  Incomplete  Culture, blood (Routine X 2) w Reflex to ID Panel     Status: None (Preliminary result)   Collection Time: 11/05/22  6:30 PM   Specimen: BLOOD LEFT HAND  Result Value Ref Range Status   Specimen Description BLOOD LEFT HAND UPPER LOWER  Final   Special Requests   Final    BOTTLES DRAWN AEROBIC AND ANAEROBIC Blood Culture results may not be optimal due to an excessive volume of blood received in culture bottles   Culture   Final    NO GROWTH 2 DAYS Performed at [facility], [address], [place], [place] [address]    Report Status PENDING  Incomplete    Studies/Results: ECHOCARDIOGRAM COMPLETE  Result Date: 11/06/2022    ECHOCARDIOGRAM REPORT   Patient Name:   Courtney Gray Date of Exam: 11/06/2022 Medical Rec #:  [id number]       Height:       65.0 in Accession #:    [id number]      Weight:       271.6 lb Date of Birth:  05-22-1970        BSA:          2.253 m Patient Age:    52 years        BP:           98/84 mmHg Patient Gender: F               HR:           80 bpm. Exam Location:  Inpatient Procedure: 2D Echo, 3D Echo, Cardiac Doppler and Color Doppler Indications:    I50.40* Unspecified combined systolic (congestive) and  diastolic                 (  congestive) heart failure  History:        Patient has prior history of Echocardiogram examinations, most                 recent 09/27/2021. CHF, Previous Myocardial Infarction and CAD,                 Signs/Symptoms:Shortness of Breath and Dyspnea; Risk                 Factors:Hypertension. ESRD. PFO. RA mass.  Sonographer:    Sheralyn Boatman RDCS Referring Phys: [id number] ERIK C HOFFMAN  Sonographer Comments: Patient is obese. Image acquisition challenging due to patient body habitus. Definity attempted, patient has no working IV. Delayed 15 minutes to position patient. IMPRESSIONS  1. Left ventricular ejection fraction, by estimation, is 30 to 35%. The left ventricle has moderately decreased function. The left ventricle demonstrates global hypokinesis. The left ventricular internal cavity size was moderately dilated. There is moderate concentric left ventricular hypertrophy. Left ventricular diastolic parameters are consistent with Grade II diastolic dysfunction (pseudonormalization).  2. Right ventricular systolic function is low normal. The right ventricular size is not well visualized. There is moderately elevated pulmonary artery systolic pressure.  3. Echodense area visualized in several views but not well seen. Cannot exclude mass. Left atrial size was moderately dilated.  4. Right atrial size was moderately dilated.  5. The mitral valve is degenerative. Severe mitral valve regurgitation. Severe mitral annular calcification.  6. Tricuspid valve regurgitation is mild to moderate.  7. The aortic valve is grossly normal. There is moderate calcification of the aortic valve. There is moderate thickening of the aortic valve. Aortic valve regurgitation is trivial. Mild aortic valve stenosis. Aortic valve mean gradient measures 11.5 mmHg.  8. The inferior vena cava is dilated in size with >50% respiratory variability, suggesting right atrial pressure of 8 mmHg. Comparison(s): Changes from prior study  are noted. LV dilated, EF reduced with severe MR. Left atrium with echodensity, cannot exclude mass. Conclusion(s)/Recommendation(s): Findings communicated with primary inpatient team and cardiology team. FINDINGS  Left Ventricle: Left ventricular ejection fraction, by estimation, is 30 to 35%. The left ventricle has moderately decreased function. The left ventricle demonstrates global hypokinesis. The left ventricular internal cavity size was moderately dilated. There is moderate concentric left ventricular hypertrophy. Left ventricular diastolic parameters are consistent with Grade II diastolic dysfunction (pseudonormalization). Right Ventricle: The right ventricular size is not well visualized. Right vetricular wall thickness was not well visualized. Right ventricular systolic function is low normal. There is moderately elevated pulmonary artery systolic pressure. The tricuspid  regurgitant velocity is 3.08 m/s, and with an assumed right atrial pressure of 8 mmHg, the estimated right ventricular systolic pressure is 45.9 mmHg. Left Atrium: Echodense area visualized in several views but not well seen. Cannot exclude mass. Left atrial size was moderately dilated. Right Atrium: Right atrial size was moderately dilated. Pericardium: There is no evidence of pericardial effusion. Mitral Valve: ERO 0.62, Rvol 120 ml. The mitral valve is degenerative in appearance. There is mild thickening of the mitral valve leaflet(s). There is mild calcification of the mitral valve leaflet(s). Severe mitral annular calcification. Severe mitral valve regurgitation, with eccentric posteriorly directed jet. MV peak gradient, 13.7 mmHg. The mean mitral valve gradient is 6.0 mmHg. Tricuspid Valve: The tricuspid valve is not well visualized. Tricuspid valve regurgitation is mild to moderate. No evidence of tricuspid stenosis. Aortic Valve: The aortic valve is grossly normal. There is moderate calcification of the aortic valve.  There is  moderate thickening of the aortic valve. Aortic valve regurgitation is trivial. Mild aortic stenosis is present. Aortic valve mean gradient measures 11.5 mmHg. Aortic valve peak gradient measures 21.1 mmHg. Aortic valve area, by VTI measures 1.80 cm. Pulmonic Valve: The pulmonic valve was not well visualized. Pulmonic valve regurgitation is trivial. No evidence of pulmonic stenosis. Aorta: The aortic root and ascending aorta are structurally normal, with no evidence of dilitation. Venous: The inferior vena cava is dilated in size with greater than 50% respiratory variability, suggesting right atrial pressure of 8 mmHg. IAS/Shunts: The interatrial septum was not well visualized.  LEFT VENTRICLE PLAX 2D LVIDd:         5.80 cm      Diastology LVIDs:         4.80 cm      LV e' medial:    5.22 cm/s LV PW:         1.30 cm      LV E/e' medial:  31.4 LV IVS:        1.30 cm      LV e' lateral:   8.70 cm/s LVOT diam:     2.20 cm      LV E/e' lateral: 18.9 LV SV:         77 LV SV Index:   34 LVOT Area:     3.80 cm  LV Volumes (MOD) LV vol d, MOD A2C: 274.0 ml LV vol d, MOD A4C: 260.0 ml LV vol s, MOD A2C: 181.0 ml LV vol s, MOD A4C: 181.0 ml LV SV MOD A2C:     93.0 ml LV SV MOD A4C:     260.0 ml LV SV MOD BP:      87.7 ml RIGHT VENTRICLE            IVC RV S prime:     9.79 cm/s  IVC diam: 2.00 cm TAPSE (M-mode): 1.6 cm LEFT ATRIUM              Index        RIGHT ATRIUM           Index LA diam:        3.00 cm  1.33 cm/m   RA Area:     23.60 cm LA Vol (A2C):   86.4 ml  38.35 ml/m  RA Volume:   82.20 ml  36.49 ml/m LA Vol (A4C):   120.0 ml 53.26 ml/m LA Biplane Vol: 103.0 ml 45.72 ml/m  AORTIC VALVE                     PULMONIC VALVE AV Area (Vmax):    1.89 cm      PR End Diast Vel: 1.96 msec AV Area (Vmean):   1.84 cm AV Area (VTI):     1.80 cm AV Vmax:           229.50 cm/s AV Vmean:          154.000 cm/s AV VTI:            0.430 m AV Peak Grad:      21.1 mmHg AV Mean Grad:      11.5 mmHg LVOT Vmax:         114.00  cm/s LVOT Vmean:        74.400 cm/s LVOT VTI:          0.203 m LVOT/AV VTI ratio: 0.47  AORTA Ao Root diam: 2.80 cm Ao  Asc diam:  3.50 cm MITRAL VALVE                  TRICUSPID VALVE MV Area (PHT): 4.68 cm       TR Peak grad:   37.9 mmHg MV Area VTI:   1.75 cm       TR Vmax:        308.00 cm/s MV Peak grad:  13.7 mmHg MV Mean grad:  6.0 mmHg       SHUNTS MV Vmax:       1.85 m/s       Systemic VTI:  0.20 m MV Vmean:      114.0 cm/s     Systemic Diam: 2.20 cm MV Decel Time: 162 msec MR Peak grad:    114.9 mmHg MR Mean grad:    77.0 mmHg MR Vmax:         536.00 cm/s MR Vmean:        418.5 cm/s MR PISA:         9.05 cm MR PISA Eff ROA: 62 mm MR PISA Radius:  1.20 cm MV E velocity: 164.00 cm/s MV A velocity: 90.30 cm/s MV E/A ratio:  1.82 Jodelle Red MD Electronically signed by Jodelle Red MD Signature Date/Time: 11/06/2022/2:36:33 PM    Final     Medications: I have reviewed the patient's current medications. Scheduled Meds:  aspirin EC  81 mg Oral Daily   atorvastatin  10 mg Oral Daily   dicyclomine  10 mg Oral TID   gabapentin  200 mg Oral Daily   heparin  5,000 Units Subcutaneous Q8H   levothyroxine  100 mcg Oral Q0600   melatonin  3 mg Oral QHS   midodrine  10 mg Oral Q M,W,F-2000   pantoprazole  40 mg Oral Daily   sucroferric oxyhydroxide  1,000 mg Oral TID WC   Continuous Infusions:  sodium thiosulfate 25 g in sodium chloride 0.9 % 200 mL Infusion for Calciphylaxis     [START ON 11/08/2022] sodium thiosulfate 25 g in sodium chloride 0.9 % 200 mL Infusion for Calciphylaxis     PRN Meds:.cyclobenzaprine, diphenhydrAMINE, oxyCODONE-acetaminophen  Assessment/Plan: Principal Problem:   Acute exacerbation of CHF (congestive heart failure) (HCC) Active Problems:   ESRD on dialysis (HCC)   Chronic respiratory failure with hypoxia (HCC)   Elevated troponin level not due to acute coronary syndrome   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HCC)   Acute on chronic combined  systolic and diastolic CHF (congestive heart failure) (HCC)   Generalized weakness   Elevated troponin   Severe mitral regurgitation  Courtney Gray is a 52 y.o. female with a PMHx of HTN, ESRD on iHD, NSTEMI, obesity and DM who presents with generalized weakness and fatigue concerning for acute on chronic heart failure exacerbation found to newly reduced EF (30-35%)   Active Problems   #ESRD  iHD   #Hyperkalemia, resolved  #H/o chronic anemia Dialysis schedule MWF but went for dialysis yesterday on Thursday 8/1. She wished to resume dialysis on Saturday. Found to have low immunity to Hepatitis B. May consider HBV vaccine.     -Nephrology consulted, appreciate recs, resume dialysis on 8/3 -Consider HBV vaccine for dialysis  -Cont midodrine 10mg  during dialysis days  -Follow blood cultures - no growth at 48 hrs     #Acute on chronic heart failure exacerbation with newly reduced EF (EF 30-35%) #Elevated troponins  H/o NSTEMI  #Left atrial mass?  TEE showed newly reduced  EF at 30-35% with LVH, severe MR, mild-mod TR, and echodense area at left atria possible c/f mass. Last ECHO in 09/27/2021 showed EF 65-70% and noted a calcified right atrial mass with unclear etiology, possible calcified thrombus. Continues to require 3L nasal cannula but denies SOB. GDMT management difficult iso dialysis and worsening heart failure. Continue to manage volume with dialysis     -Cardiology consulted, appreciate recs, may consider adding a beta-blocker on non-dialysis days  -Cont bASA  -Daily BMP, CBC  -Dialysis as stated above  -Continue nasal cannula, wean O2 as tolerated  -Plan for RHC/LHC on 8/5 -Plan for TEE on 8/6  #Generalized weakness #Fatigue #Difficulty sleeping  Multiple possible etiologies due to chronic conditions. Likely newly reduced EF contributor for recent changes.    -Recommend outpatient sleep study for OSA  -Address newly reduced EF as stated above  -Start melatonin 3mg  at  bedtime    #Abdominal discomfort #H/o calciphylaxis Endorses bilateral abdominal discomfort. Previously had ulcer in her left anterior abdominal wall. Recent hospital admission on 05/2022 noted open wound right flank was clean and dry and open left lower abdominal wall would that was mostly chronic and managed with dressings. 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   -WOCN consulted, appreciate recs, cleaned with normal saline -Ischial dressing in place    Chronic Problems   #Hypothyroidism: Cont home levothyroxine 100 mcg p.o. daily   #T2DM: A1C 5.1 on June 2024. Diet controlled   #HLD: LDL 23 in 08/2021. Cont home atorvastatin 10 mg daily    #GERD: Cont home Protonix 40mg  daily    #Phantom leg pain: Takes Gabapentin and Lyrica at home. Will continue home gabapentin 200mg  daily and Lyrica 50mg  daily    #HTN: Previously prescribed ACEi. Not currently taking any home hypertensives. BP stable    #Chronic respiratory failure: uses 3-4L oxygen at home as needed. Currently on nasal cannula. Wean O2 as tolerated    #Peripheral artery disease: s/p bilateral AKA. Managed with ASA and atorvastatin as listed above     #Constipation: Takes Miralax daily at home. Hold for now iso recent diarrhea    DVT prophx: declined DVT prophylaxis per patient   Diet: Renal Bowel: PRN Code: Full    Prior to Admission Living Arrangement: [facility] Anticipated Discharge Location: [facility]F - [facility]  Barriers to Discharge: Medical Workup   Dispo: Admit patient to inpatient with expected length of stay more than 2 midnights.  This is a Psychologist, occupational Note.  The care of the patient was discussed with Dr. Mikey Bussing and the assessment and plan formulated with their assistance.  Please see  their attached note for official documentation of the daily encounter.   LOS: 2 days   Loyal Buba 11/07/2022, 1:52 PM

## 2022-11-08 DIAGNOSIS — Z992 Dependence on renal dialysis: Secondary | ICD-10-CM | POA: Diagnosis not present

## 2022-11-08 DIAGNOSIS — I5043 Acute on chronic combined systolic (congestive) and diastolic (congestive) heart failure: Secondary | ICD-10-CM | POA: Diagnosis not present

## 2022-11-08 DIAGNOSIS — D151 Benign neoplasm of heart: Secondary | ICD-10-CM

## 2022-11-08 DIAGNOSIS — N186 End stage renal disease: Secondary | ICD-10-CM

## 2022-11-08 DIAGNOSIS — I318 Other specified diseases of pericardium: Secondary | ICD-10-CM | POA: Diagnosis not present

## 2022-11-08 DIAGNOSIS — I340 Nonrheumatic mitral (valve) insufficiency: Secondary | ICD-10-CM | POA: Diagnosis not present

## 2022-11-08 MED ORDER — HEPARIN SODIUM (PORCINE) 1000 UNIT/ML IJ SOLN
4200.0000 [IU] | Freq: Once | INTRAMUSCULAR | Status: AC
  Administered 2022-11-08: 4200 [IU]
  Filled 2022-11-08: qty 4.2
  Filled 2022-11-08: qty 5

## 2022-11-08 MED ORDER — MIDODRINE HCL 5 MG PO TABS
10.0000 mg | ORAL_TABLET | Freq: Once | ORAL | Status: AC
  Administered 2022-11-10: 10 mg via ORAL
  Filled 2022-11-08: qty 2

## 2022-11-08 MED ORDER — HEPARIN SODIUM (PORCINE) 1000 UNIT/ML IJ SOLN
INTRAMUSCULAR | Status: AC
  Administered 2022-11-08: 5000 [IU] via INTRAVENOUS_CENTRAL
  Filled 2022-11-08: qty 5

## 2022-11-08 NOTE — Progress Notes (Signed)
Subjective:   Summary: Courtney Gray is a 52 y.o. year old female currently admitted on the IMTS HD#3 for acute on chronic worsening heart failure exacerbation.  Overnight Events: NOE   Pt seen bedside this AM. States that she hasn't been able to get any rest due to constant disturbances.   Objective:  Vital signs in last 24 hours: Vitals:   11/07/22 1708 11/07/22 1933 11/08/22 0005 11/08/22 0435  BP: (!) 146/53 124/79 128/84 (!) 143/54  Pulse:   79 78  Resp: 20   16  Temp: 97.8 F (36.6 C) 98 F (36.7 C) (!) 97.5 F (36.4 C) 98.1 F (36.7 C)  TempSrc: Oral Oral Oral Oral  SpO2: 100%  100% 100%  Weight:    129.7 kg  Height:       Supplemental O2: Nasal Cannula SpO2: 100 % O2 Flow Rate (L/min): 2 L/min   Physical Exam:  Constitutional:Tired appearing female in no acute distress Cardiovascular: RRR, no murmurs, rubs or gallops Pulmonary/Chest: normal work of breathing on room air, lungs clear to auscultation bilaterally Abdominal: soft, non-tender, non-distended Skin: warm and dry Extremities: Bilateral AKA  Filed Weights   11/06/22 1853 11/07/22 0412 11/08/22 0435  Weight: 121.2 kg 121.2 kg 129.7 kg     Intake/Output Summary (Last 24 hours) at 11/08/2022 0610 Last data filed at 11/07/2022 1829 Gross per 24 hour  Intake 237 ml  Output --  Net 237 ml   Net IO Since Admission: -2,283 mL [11/08/22 0610]  Pertinent Labs:    Latest Ref Rng & Units 11/07/2022    7:03 AM 11/06/2022    3:10 AM 11/05/2022   11:55 AM  CBC  WBC 4.0 - 10.5 K/uL 5.7  7.1    Hemoglobin 12.0 - 15.0 g/dL 16.1  09.6  04.5   Hematocrit 36.0 - 46.0 % 35.1  33.5  36.0   Platelets 150 - 400 K/uL 99  103         Latest Ref Rng & Units 11/07/2022    7:03 AM 11/06/2022    3:10 AM 11/05/2022   11:55 AM  CMP  Glucose 70 - 99 mg/dL 409  811    BUN 6 - 20 mg/dL 37  66    Creatinine 9.14 - 1.00 mg/dL 7.82  95.62    Sodium 130 - 145 mmol/L 134  133  135   Potassium 3.5 - 5.1  mmol/L 4.7  5.9  5.3   Chloride 98 - 111 mmol/L 94  93    CO2 22 - 32 mmol/L 26  22    Calcium 8.9 - 10.3 mg/dL 9.7  9.3       Assessment/Plan:   Principal Problem:   Acute exacerbation of CHF (congestive heart failure) (HCC) Active Problems:   ESRD on dialysis (HCC)   Chronic respiratory failure with hypoxia (HCC)   Elevated troponin level not due to acute coronary syndrome   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HCC)   Acute on chronic combined systolic and diastolic CHF (congestive heart failure) (HCC)   Generalized weakness   Elevated troponin   Severe mitral regurgitation   Pressure injury of right ischium, stage 4 (HCC)   Patient Summary: Courtney Gray is a 52 y.o. with a pertinent PMH of HTN, ESRD on HD, NSTEMI, HF, who presented with shortness of breath and weakness and admitted for acute on chronic heart failure exacerbation  found to have newly reduced EF of (30-35%).    #Acute on Chronic Heart Failure with newly reduced EF of 30-35% #Severe Mitral Regurgitation  #Left Atrial Mass on TTE Pt with newly reduced ejection fraction of 30-35% seen on TTE, as well as density concerning of mass in Left Atrium. Cardiology on board, planning for RHC/LHC on 8/5, and TEE on 8/6. GDMT management difficult given ESRD and hypotension during dialysis sessions. Could consider beta blocker on non-dialysis days. Will continue to monitor.   Plan:  - Still planning for TEE and R/L Hc on 8/6 and 8/5 respectively  - Volume control through Dialysis   #ESRD on TTS schedule Initially on HD MWF, but has been on TTS schedule. 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 daily   #T2DM: A1C 5.1 on June 2024. Diet controlled   #HLD: LDL 23 in 08/2021. Cont home atorvastatin 10 mg daily    #GERD: Cont home Protonix 40mg  daily    #Phantom leg pain: Takes Gabapentin and Lyrica at home. Will continue home gabapentin 200mg  daily and Lyrica 50mg  daily    #HTN: Previously prescribed ACEi. Not currently taking any home hypertensives. BP stable    #Chronic respiratory failure: uses 3-4L oxygen at home as needed. Currently on nasal cannula. Wean O2 as tolerated    #Peripheral artery disease: s/p bilateral AKA. Managed with ASA and atorvastatin as listed above     #Constipation: Takes Miralax daily at home. Hold for now iso recent diarrhea    Diet: Renal IVF: None,None VTE: Heparin Code: Full    Dispo: Anticipated discharge to Home in 3 days pending medical management.   Olegario Messier, MD PGY-2 Internal Medicine Resident Pager Number [phone] Please contact the on call pager after 5 pm and on weekends at [phone].

## 2022-11-08 NOTE — Plan of Care (Signed)
Continue to change dressing and keep wound clean and dry and intact

## 2022-11-08 NOTE — Progress Notes (Signed)
Rounding Note    Patient Name: Courtney Gray Date of Encounter: 11/08/2022  [facility] Cardiologist: None   Subjective   No acute overnight events. She has chronic respiratory failure and is on O2 at home but denies any new or worsening shortness of breath. No chest pain. Tentative plan is for [facility] on 8/5 and TEE on 8/6.   Seems a little aggravated today that people keep talking to her about these procedures every day. She doesn't feel that she can make a decision whether to proceed or not because she has not been able to sleep, feels uncomfortable in the bed.  Inpatient Medications    Scheduled Meds:  aspirin EC  81 mg Oral Daily   atorvastatin  10 mg Oral Daily   dicyclomine  10 mg Oral TID   gabapentin  200 mg Oral Daily   heparin  5,000 Units Subcutaneous Q8H   levothyroxine  100 mcg Oral Q0600   melatonin  3 mg Oral QHS   midodrine  10 mg Oral Q M,W,F-2000   pantoprazole  40 mg Oral Daily   sucroferric oxyhydroxide  1,000 mg Oral TID WC   Continuous Infusions:  sodium thiosulfate 25 g in sodium chloride 0.9 % 200 mL Infusion for Calciphylaxis     sodium thiosulfate 25 g in sodium chloride 0.9 % 200 mL Infusion for Calciphylaxis     PRN Meds: cyclobenzaprine, diphenhydrAMINE, oxyCODONE, polyethylene glycol   Vital Signs    Vitals:   11/07/22 1933 11/08/22 0005 11/08/22 0435 11/08/22 0740  BP: 124/79 128/84 (!) 143/54   Pulse:  79 78 75  Resp:   16 18  Temp: 98 F (36.7 C) (!) 97.5 F (36.4 C) 98.1 F (36.7 C) 97.6 F (36.4 C)  TempSrc: Oral Oral Oral Oral  SpO2:  100% 100% 100%  Weight:   129.7 kg   Height:        Intake/Output Summary (Last 24 hours) at 11/08/2022 0814 Last data filed at 11/07/2022 1829 Gross per 24 hour  Intake 237 ml  Output --  Net 237 ml      11/08/2022    4:35 AM 11/07/2022    4:12 AM 11/06/2022    6:53 PM  Last 3 Weights  Weight (lbs) 285 lb 15 oz 267 lb 3.2 oz 267 lb 3.2 oz  Weight (kg) 129.7 kg 121.2 kg 121.2 kg       Telemetry    Normal sinus rhythm with rates in the 70s to 80s. - Personally Reviewed  ECG    No new ECG tracing today. - Personally Reviewed  Physical Exam   GEN: Morbidly obese African-American. No acute distress.  On 3L of O2 via nasal cannula. Neck: JVD difficult to assess due to body habitus. Cardiac: RRR. II/VI systolic murmur. No rubs or gallops. Respiratory: Clear to auscultation bilaterally. No wheezes, rhonchi, or rales. GI: Soft, non-distended, and non-tender. MS: S/p bilateral above knee amputations. Neuro:  No focal deficits. Psych: Normal affect. Responds appropriately.  Labs    High Sensitivity Troponin:   Recent Labs  Lab 11/05/22 1041 11/05/22 1241  TROPONINIHS 242* 239*     Chemistry Recent Labs  Lab 11/05/22 1041 11/05/22 1155 11/06/22 0310 11/07/22 0703  NA 137 135 133* 134*  K 5.3* 5.3* 5.9* 4.7  CL 94*  --  93* 94*  CO2 23  --  22 26  GLUCOSE 87  --  107* 106*  BUN 55*  --  66* 37*  CREATININE 9.93*  --  11.24* 7.45*  CALCIUM 9.4  --  9.3 9.7  PROT 8.1  --   --   --   ALBUMIN 3.7  --   --  3.6  AST 23  --   --   --   ALT 21  --   --   --   ALKPHOS 80  --   --   --   BILITOT 0.8  --   --   --   GFRNONAA 4*  --  4* 6*  ANIONGAP 20*  --  18* 14    Lipids No results for input(s): "CHOL", "TRIG", "HDL", "LABVLDL", "LDLCALC", "CHOLHDL" in the last 168 hours.  Hematology Recent Labs  Lab 11/05/22 1041 11/05/22 1155 11/06/22 0310 11/07/22 0703  WBC 6.7  --  7.1 5.7  RBC 3.83*  --  3.49* 3.65*  HGB 11.2* 12.2 10.6* 10.7*  HCT 36.5 36.0 33.5* 35.1*  MCV 95.3  --  96.0 96.2  MCH 29.2  --  30.4 29.3  MCHC 30.7  --  31.6 30.5  RDW 15.9*  --  15.9* 15.6*  PLT 96*  --  103* 99*   Thyroid  Recent Labs  Lab 11/06/22 0310  TSH 4.008    BNP Recent Labs  Lab 11/05/22 1041  BNP 1,832.9*    DDimer No results for input(s): "DDIMER" in the last 168 hours.   Radiology    ECHOCARDIOGRAM COMPLETE  Result Date: 11/06/2022     ECHOCARDIOGRAM REPORT   Patient Name:   ELIZ NIGG Date of Exam: 11/06/2022 Medical Rec #:  [id number]       Height:       65.0 in Accession #:    [id number]      Weight:       271.6 lb Date of Birth:  08-02-1970        BSA:          2.253 m Patient Age:    52 years        BP:           98/84 mmHg Patient Gender: F               HR:           80 bpm. Exam Location:  Inpatient Procedure: 2D Echo, 3D Echo, Cardiac Doppler and Color Doppler Indications:    I50.40* Unspecified combined systolic (congestive) and diastolic                 (congestive) heart failure  History:        Patient has prior history of Echocardiogram examinations, most                 recent 09/27/2021. CHF, Previous Myocardial Infarction and CAD,                 Signs/Symptoms:Shortness of Breath and Dyspnea; Risk                 Factors:Hypertension. ESRD. PFO. RA mass.  Sonographer:    Sheralyn Boatman RDCS Referring Phys: [id number] ERIK C HOFFMAN  Sonographer Comments: Patient is obese. Image acquisition challenging due to patient body habitus. Definity attempted, patient has no working IV. Delayed 15 minutes to position patient. IMPRESSIONS  1. Left ventricular ejection fraction, by estimation, is 30 to 35%. The left ventricle has moderately decreased function. The left ventricle demonstrates global hypokinesis. The left ventricular internal cavity size was moderately dilated. There is moderate  concentric left ventricular hypertrophy. Left ventricular diastolic parameters are consistent with Grade II diastolic dysfunction (pseudonormalization).  2. Right ventricular systolic function is low normal. The right ventricular size is not well visualized. There is moderately elevated pulmonary artery systolic pressure.  3. Echodense area visualized in several views but not well seen. Cannot exclude mass. Left atrial size was moderately dilated.  4. Right atrial size was moderately dilated.  5. The mitral valve is degenerative. Severe mitral valve regurgitation.  Severe mitral annular calcification.  6. Tricuspid valve regurgitation is mild to moderate.  7. The aortic valve is grossly normal. There is moderate calcification of the aortic valve. There is moderate thickening of the aortic valve. Aortic valve regurgitation is trivial. Mild aortic valve stenosis. Aortic valve mean gradient measures 11.5 mmHg.  8. The inferior vena cava is dilated in size with >50% respiratory variability, suggesting right atrial pressure of 8 mmHg. Comparison(s): Changes from prior study are noted. LV dilated, EF reduced with severe MR. Left atrium with echodensity, cannot exclude mass. Conclusion(s)/Recommendation(s): Findings communicated with primary inpatient team and cardiology team. FINDINGS  Left Ventricle: Left ventricular ejection fraction, by estimation, is 30 to 35%. The left ventricle has moderately decreased function. The left ventricle demonstrates global hypokinesis. The left ventricular internal cavity size was moderately dilated. There is moderate concentric left ventricular hypertrophy. Left ventricular diastolic parameters are consistent with Grade II diastolic dysfunction (pseudonormalization). Right Ventricle: The right ventricular size is not well visualized. Right vetricular wall thickness was not well visualized. Right ventricular systolic function is low normal. There is moderately elevated pulmonary artery systolic pressure. The tricuspid  regurgitant velocity is 3.08 m/s, and with an assumed right atrial pressure of 8 mmHg, the estimated right ventricular systolic pressure is 45.9 mmHg. Left Atrium: Echodense area visualized in several views but not well seen. Cannot exclude mass. Left atrial size was moderately dilated. Right Atrium: Right atrial size was moderately dilated. Pericardium: There is no evidence of pericardial effusion. Mitral Valve: ERO 0.62, Rvol 120 ml. The mitral valve is degenerative in appearance. There is mild thickening of the mitral valve  leaflet(s). There is mild calcification of the mitral valve leaflet(s). Severe mitral annular calcification. Severe mitral valve regurgitation, with eccentric posteriorly directed jet. MV peak gradient, 13.7 mmHg. The mean mitral valve gradient is 6.0 mmHg. Tricuspid Valve: The tricuspid valve is not well visualized. Tricuspid valve regurgitation is mild to moderate. No evidence of tricuspid stenosis. Aortic Valve: The aortic valve is grossly normal. There is moderate calcification of the aortic valve. There is moderate thickening of the aortic valve. Aortic valve regurgitation is trivial. Mild aortic stenosis is present. Aortic valve mean gradient measures 11.5 mmHg. Aortic valve peak gradient measures 21.1 mmHg. Aortic valve area, by VTI measures 1.80 cm. Pulmonic Valve: The pulmonic valve was not well visualized. Pulmonic valve regurgitation is trivial. No evidence of pulmonic stenosis. Aorta: The aortic root and ascending aorta are structurally normal, with no evidence of dilitation. Venous: The inferior vena cava is dilated in size with greater than 50% respiratory variability, suggesting right atrial pressure of 8 mmHg. IAS/Shunts: The interatrial septum was not well visualized.  LEFT VENTRICLE PLAX 2D LVIDd:         5.80 cm      Diastology LVIDs:         4.80 cm      LV e' medial:    5.22 cm/s LV PW:         1.30 cm  LV E/e' medial:  31.4 LV IVS:        1.30 cm      LV e' lateral:   8.70 cm/s LVOT diam:     2.20 cm      LV E/e' lateral: 18.9 LV SV:         77 LV SV Index:   34 LVOT Area:     3.80 cm  LV Volumes (MOD) LV vol d, MOD A2C: 274.0 ml LV vol d, MOD A4C: 260.0 ml LV vol s, MOD A2C: 181.0 ml LV vol s, MOD A4C: 181.0 ml LV SV MOD A2C:     93.0 ml LV SV MOD A4C:     260.0 ml LV SV MOD BP:      87.7 ml RIGHT VENTRICLE            IVC RV S prime:     9.79 cm/s  IVC diam: 2.00 cm TAPSE (M-mode): 1.6 cm LEFT ATRIUM              Index        RIGHT ATRIUM           Index LA diam:        3.00 cm  1.33  cm/m   RA Area:     23.60 cm LA Vol (A2C):   86.4 ml  38.35 ml/m  RA Volume:   82.20 ml  36.49 ml/m LA Vol (A4C):   120.0 ml 53.26 ml/m LA Biplane Vol: 103.0 ml 45.72 ml/m  AORTIC VALVE                     PULMONIC VALVE AV Area (Vmax):    1.89 cm      PR End Diast Vel: 1.96 msec AV Area (Vmean):   1.84 cm AV Area (VTI):     1.80 cm AV Vmax:           229.50 cm/s AV Vmean:          154.000 cm/s AV VTI:            0.430 m AV Peak Grad:      21.1 mmHg AV Mean Grad:      11.5 mmHg LVOT Vmax:         114.00 cm/s LVOT Vmean:        74.400 cm/s LVOT VTI:          0.203 m LVOT/AV VTI ratio: 0.47  AORTA Ao Root diam: 2.80 cm Ao Asc diam:  3.50 cm MITRAL VALVE                  TRICUSPID VALVE MV Area (PHT): 4.68 cm       TR Peak grad:   37.9 mmHg MV Area VTI:   1.75 cm       TR Vmax:        308.00 cm/s MV Peak grad:  13.7 mmHg MV Mean grad:  6.0 mmHg       SHUNTS MV Vmax:       1.85 m/s       Systemic VTI:  0.20 m MV Vmean:      114.0 cm/s     Systemic Diam: 2.20 cm MV Decel Time: 162 msec MR Peak grad:    114.9 mmHg MR Mean grad:    77.0 mmHg MR Vmax:         536.00 cm/s MR Vmean:        418.5  cm/s MR PISA:         9.05 cm MR PISA Eff ROA: 62 mm MR PISA Radius:  1.20 cm MV E velocity: 164.00 cm/s MV A velocity: 90.30 cm/s MV E/A ratio:  1.82 Jodelle Red MD Electronically signed by Jodelle Red MD Signature Date/Time: 11/06/2022/2:36:33 PM    Final     Cardiac Studies   TTE 09/10/2021: Impressions:  1. Poor quality study. Globally LV function appears normal.   2. Left ventricular ejection fraction, by estimation, is 50 to 55%. The  left ventricle has low normal function. Left ventricular endocardial  border not optimally defined to evaluate regional wall motion. There is  moderate concentric left ventricular  hypertrophy. Left ventricular diastolic parameters are consistent with  Grade II diastolic dysfunction (pseudonormalization).   3. Right ventricular systolic function was not  well visualized. The right  ventricular size is not well visualized. There is mildly elevated  pulmonary artery systolic pressure.   4. Left atrial size was mild to moderately dilated.   5. The mitral valve is degenerative. Trivial mitral valve regurgitation.  No evidence of mitral stenosis.   6. Tricuspid valve regurgitation is mild to moderate.   7. The aortic valve is tricuspid. Aortic valve regurgitation is not  visualized. Mild aortic valve stenosis.   8. The inferior vena cava is normal in size with <50% respiratory  variability, suggesting right atrial pressure of 8 mmHg.  _______________     TEE 09/27/2021: Impression:  1. Dialysis catheter noted in SVC; calcified right atrial mass noted  (etiology unclear; possible calcified thrombus); if pt can tolerate,  cardiac MRI could provide more definitive evaluation.   2. Left ventricular ejection fraction, by estimation, is 65 to 70%. The  left ventricle has normal function. The left ventricle has no regional  wall motion abnormalities.   3. Right ventricular systolic function is normal. The right ventricular  size is normal.   4. Left atrial size was mildly dilated. No left atrial/left atrial  appendage thrombus was detected.   5. Right atrial size was mildly dilated.   6. The mitral valve is normal in structure. Mild mitral valve  regurgitation. Moderate mitral annular calcification.   7. Tricuspid valve regurgitation is moderate.   8. The aortic valve is tricuspid. Aortic valve regurgitation is not  visualized. Aortic valve sclerosis is present, with no evidence of aortic  valve stenosis.   9. There is mild (Grade II) plaque involving the descending aorta.  10. Evidence of atrial level shunting detected by color flow Doppler.  Agitated saline contrast bubble study was positive with shunting observed  within 3-6 cardiac cycles suggestive of interatrial shunt.  _______________  TTE 11/06/2022: 1. Left ventricular ejection  fraction, by estimation, is 30 to 35%. The  left ventricle has moderately decreased function. The left ventricle  demonstrates global hypokinesis. The left ventricular internal cavity size  was moderately dilated. There is  moderate concentric left ventricular hypertrophy. Left ventricular  diastolic parameters are consistent with Grade II diastolic dysfunction  (pseudonormalization).   2. Right ventricular systolic function is low normal. The right  ventricular size is not well visualized. There is moderately elevated  pulmonary artery systolic pressure.   3. Echodense area visualized in several views but not well seen. Cannot  exclude mass. Left atrial size was moderately dilated.   4. Right atrial size was moderately dilated.   5. The mitral valve is degenerative. Severe mitral valve regurgitation.  Severe mitral annular calcification.  6. Tricuspid valve regurgitation is mild to moderate.   7. The aortic valve is grossly normal. There is moderate calcification of  the aortic valve. There is moderate thickening of the aortic valve. Aortic  valve regurgitation is trivial. Mild aortic valve stenosis. Aortic valve  mean gradient measures 11.5  mmHg.   8. The inferior vena cava is dilated in size with >50% respiratory  variability, suggesting right atrial pressure of 8 mmHg    Patient Profile     53 y.o. 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 Cardiology was consulted for elevated troponin.   Assessment & Plan    Elevated Troponin Patient presented with generalized weakness and was found to have an elevated troponin. High-sensitivity  troponin 242 >> 239. No acute ischemic EKG changes. Echo showed a drop in LVEF to 30-35% with global hypokinesis. - No chest pain. - Troponin elevation may be secondary to ESRD. However, she does have multiple CV risk factors including PAD. Given drop in EF, tentative plan is to proceed with [facility] on Monday with Dr. Gala Romney.   Acute on Chronic Combined CHF with Newly Reduced EF Last Echo in 09/2021 showed LVEF of 50-55% with moderate LVH and grade 2 diastolic dysfunction. BNP elevated at 1,832 this admission. Chest x-ray showed enlarged cardiopericardial silhouette with vascular congestion. Echo showed LVEF of 30-35% with global hypokinesis, moderate LVH, and grade 2 diastolic dysfunction as well as low normal RV function. - Volume status difficult to assess due to morbid obesity but does not appear significantly volume overloaded. - Volume status managed via dialysis. - GDMT limited due to ESRD and hypotension on dialysis days. Could consider adding a beta-blocker (Toprol-XL or Coreg) on non-dialysis days.   Possible Calcified Thrombus on Dialysis Catheter Echodense Area in Left Atrium TEE in 09/2021 showed a calcified right atrial mass. Etiology was unclear but possible calcified thrombus. 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 Dialysis catheterization was replaced at that time. TTE this admission did not reveal a mass in the right atrium but did show an echodense area in the left atrium (mass cannot be excluded. Blood cultures this admission negative so far. - Plan is for TEE on Tuesday.  Severe Mitral Regurgitation Noted on Echo this admission. - Plan is for [facility] and TEE next week as above.   Hypertension BP soft at times but stable. - Continue Midodrine on dialysis days.   Hyperlipidemia LDL 23 in 08/2021. - Continue home Lipitor 10mg  daily.   ESRD On hemodialysis M/W/F. - Management per  Nephrology.   Hyperkalemia Potassium 5.9 yesterday. She received dialysis and potassium 4.7 today. - Given ESRD, will defer management to Nephrology and primary team.   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

## 2022-11-08 NOTE — Progress Notes (Signed)
Subjective:   Seen in room - for HD off schedule today then resume Monday.  No new issues.     Objective Vital signs in last 24 hours: Vitals:   11/07/22 1933 11/08/22 0005 11/08/22 0435 11/08/22 0740  BP: 124/79 128/84 (!) 143/54   Pulse:  79 78 75  Resp:   16 18  Temp: 98 F (36.7 C) (!) 97.5 F (36.4 C) 98.1 F (36.7 C) 97.6 F (36.4 C)  TempSrc: Oral Oral Oral Oral  SpO2:  100% 100% 100%  Weight:   129.7 kg   Height:       Weight change: 6.5 kg  Physical Exam: General: Alert  Obese, chronically ill appearing Female, NAD  Heart: RRR, 1/6/sem, no rg Lungs: CTA  but distant sounds  2/2 body habitus /  2 l [place] O2, nonlabored  Abdomen: Morbid obese, NABS, soft Extremities: Bilat AKA, no stump edema,  Dialysis Access: L  internal jugular TDC       OP Dialysis Orders: Center: [facility] MWF   . 4hrs EDW 119,  2k, 2ca bath  Hep 5000 units, L internal jugular TDC  HD Bath Mircera 50 mcg  (last given 11/03/22) Venofer 50 mg  w wkly hd    Hectorol 4 mcg   Sodium thiosulfate 12.5g post HD   Assessment/Plan Weakness=  now thought to be cardiac related --> EF 30-35%, severe MVR.  Cardiology following and w/u underway - likely TEE and LHC next week.  Appreciate Dr. Fabio Bering thoughtful assessment.  ESRD -  HD MWF, had HD off schedule Thurs after missing Wed.  Declined HD Friday - plan next today then resume MWF  Chronic HF p EF = EF 30-35% cardiology following.   With severe MVR.  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 , Sodium thiosulfate on HD  ( last PTH 1053 on 10/22/22  )  Chronic Back pain = meds per admit   Estill Bakes MD  [facility] Pager [phone]   Labs: Basic Metabolic  Panel: Recent Labs  Lab 11/05/22 1041 11/05/22 1155 11/06/22 0310 11/07/22 0703  NA 137 135 133* 134*  K 5.3* 5.3* 5.9* 4.7  CL 94*  --  93* 94*  CO2 23  --  22 26  GLUCOSE 87  --  107* 106*  BUN 55*  --  66* 37*  CREATININE 9.93*  --  11.24* 7.45*  CALCIUM 9.4  --  9.3 9.7  PHOS  --   --  9.0* 7.2*   Liver Function Tests: Recent Labs  Lab 11/05/22 1041 11/07/22 0703  AST 23  --   ALT 21  --   ALKPHOS 80  --   BILITOT 0.8  --   PROT 8.1  --   ALBUMIN 3.7 3.6   Recent Labs  Lab 11/05/22 1041  LIPASE 36   No results for input(s): "AMMONIA" in the last 168 hours. CBC: Recent Labs  Lab 11/05/22 1041 11/05/22 1155 11/06/22 0310 11/07/22 0703  WBC 6.7  --  7.1 5.7  NEUTROABS 4.6  --   --  3.8  HGB 11.2* 12.2 10.6* 10.7*  HCT 36.5 36.0 33.5* 35.1*  MCV 95.3  --  96.0 96.2  PLT 96*  --  103* 99*   Cardiac Enzymes: No results for input(s): "CKTOTAL", "CKMB", "CKMBINDEX", "TROPONINI" in the last 168 hours. CBG: Recent Labs  Lab 11/05/22 1102 11/05/22 1232  GLUCAP 69* 82    Studies/Results: ECHOCARDIOGRAM COMPLETE  Result Date: 11/06/2022    ECHOCARDIOGRAM REPORT   Patient Name:   Courtney Gray Date of Exam: 11/06/2022 Medical Rec #:  [id number]       Height:       65.0 in Accession #:    [id number]      Weight:       271.6 lb Date of Birth:  June 10, 1970        BSA:          2.253 m Patient Age:    52 years        BP:           98/84 mmHg Patient Gender: F               HR:           80 bpm. Exam Location:  Inpatient Procedure: 2D Echo, 3D Echo, Cardiac Doppler and Color Doppler Indications:    I50.40* Unspecified combined systolic (congestive) and diastolic                 (congestive) heart failure  History:        Patient has prior history of Echocardiogram examinations, most                 recent 09/27/2021. CHF, Previous Myocardial Infarction and CAD,                 Signs/Symptoms:Shortness of Breath and Dyspnea; Risk                 Factors:Hypertension. ESRD.  PFO. RA mass.  Sonographer:    Sheralyn Boatman RDCS Referring Phys: [id number] ERIK C HOFFMAN  Sonographer Comments: Patient is obese. Image acquisition challenging due to patient body habitus. Definity attempted, patient has no working IV. Delayed 15 minutes to position patient. IMPRESSIONS  1. Left ventricular ejection fraction, by estimation, is 30 to 35%. The left ventricle has moderately decreased function. The left ventricle demonstrates global hypokinesis. The left ventricular internal cavity size was moderately dilated. There is moderate concentric left ventricular hypertrophy. Left ventricular diastolic parameters are consistent with Grade II diastolic dysfunction (pseudonormalization).  2. Right ventricular systolic function is low normal. The right ventricular size is not well visualized. There is moderately elevated pulmonary artery systolic pressure.  3. Echodense area visualized in several views but not well seen. Cannot exclude mass. Left atrial size was moderately dilated.  4. Right atrial size was moderately dilated.  5. The mitral valve is degenerative. Severe mitral valve regurgitation. Severe mitral annular calcification.  6. Tricuspid valve regurgitation is mild to moderate.  7. The aortic valve is grossly normal. There is moderate calcification of the aortic valve. There is moderate thickening of the aortic valve. Aortic valve regurgitation is trivial. Mild aortic valve stenosis. Aortic valve mean gradient measures 11.5 mmHg.  8. The inferior vena cava is dilated in size with >50% respiratory variability, suggesting right atrial pressure of 8 mmHg. Comparison(s): Changes from prior study are noted. LV dilated, EF reduced with severe MR. Left atrium with echodensity, cannot exclude mass. Conclusion(s)/Recommendation(s): Findings communicated with primary inpatient team and cardiology  team. FINDINGS  Left Ventricle: Left ventricular ejection fraction, by estimation, is 30 to 35%. The left ventricle has  moderately decreased function. The left ventricle demonstrates global hypokinesis. The left ventricular internal cavity size was moderately dilated. There is moderate concentric left ventricular hypertrophy. Left ventricular diastolic parameters are consistent with Grade II diastolic dysfunction (pseudonormalization). Right Ventricle: The right ventricular size is not well visualized. Right vetricular wall thickness was not well visualized. Right ventricular systolic function is low normal. There is moderately elevated pulmonary artery systolic pressure. The tricuspid  regurgitant velocity is 3.08 m/s, and with an assumed right atrial pressure of 8 mmHg, the estimated right ventricular systolic pressure is 45.9 mmHg. Left Atrium: Echodense area visualized in several views but not well seen. Cannot exclude mass. Left atrial size was moderately dilated. Right Atrium: Right atrial size was moderately dilated. Pericardium: There is no evidence of pericardial effusion. Mitral Valve: ERO 0.62, Rvol 120 ml. The mitral valve is degenerative in appearance. There is mild thickening of the mitral valve leaflet(s). There is mild calcification of the mitral valve leaflet(s). Severe mitral annular calcification. Severe mitral valve regurgitation, with eccentric posteriorly directed jet. MV peak gradient, 13.7 mmHg. The mean mitral valve gradient is 6.0 mmHg. Tricuspid Valve: The tricuspid valve is not well visualized. Tricuspid valve regurgitation is mild to moderate. No evidence of tricuspid stenosis. Aortic Valve: The aortic valve is grossly normal. There is moderate calcification of the aortic valve. There is moderate thickening of the aortic valve. Aortic valve regurgitation is trivial. Mild aortic stenosis is present. Aortic valve mean gradient measures 11.5 mmHg. Aortic valve peak gradient measures 21.1 mmHg. Aortic valve area, by VTI measures 1.80 cm. Pulmonic Valve: The pulmonic valve was not well visualized. Pulmonic  valve regurgitation is trivial. No evidence of pulmonic stenosis. Aorta: The aortic root and ascending aorta are structurally normal, with no evidence of dilitation. Venous: The inferior vena cava is dilated in size with greater than 50% respiratory variability, suggesting right atrial pressure of 8 mmHg. IAS/Shunts: The interatrial septum was not well visualized.  LEFT VENTRICLE PLAX 2D LVIDd:         5.80 cm      Diastology LVIDs:         4.80 cm      LV e' medial:    5.22 cm/s LV PW:         1.30 cm      LV E/e' medial:  31.4 LV IVS:        1.30 cm      LV e' lateral:   8.70 cm/s LVOT diam:     2.20 cm      LV E/e' lateral: 18.9 LV SV:         77 LV SV Index:   34 LVOT Area:     3.80 cm  LV Volumes (MOD) LV vol d, MOD A2C: 274.0 ml LV vol d, MOD A4C: 260.0 ml LV vol s, MOD A2C: 181.0 ml LV vol s, MOD A4C: 181.0 ml LV SV MOD A2C:     93.0 ml LV SV MOD A4C:     260.0 ml LV SV MOD BP:      87.7 ml RIGHT VENTRICLE            IVC RV S prime:     9.79 cm/s  IVC diam: 2.00 cm TAPSE (M-mode): 1.6 cm LEFT ATRIUM              Index  RIGHT ATRIUM           Index LA diam:        3.00 cm  1.33 cm/m   RA Area:     23.60 cm LA Vol (A2C):   86.4 ml  38.35 ml/m  RA Volume:   82.20 ml  36.49 ml/m LA Vol (A4C):   120.0 ml 53.26 ml/m LA Biplane Vol: 103.0 ml 45.72 ml/m  AORTIC VALVE                     PULMONIC VALVE AV Area (Vmax):    1.89 cm      PR End Diast Vel: 1.96 msec AV Area (Vmean):   1.84 cm AV Area (VTI):     1.80 cm AV Vmax:           229.50 cm/s AV Vmean:          154.000 cm/s AV VTI:            0.430 m AV Peak Grad:      21.1 mmHg AV Mean Grad:      11.5 mmHg LVOT Vmax:         114.00 cm/s LVOT Vmean:        74.400 cm/s LVOT VTI:          0.203 m LVOT/AV VTI ratio: 0.47  AORTA Ao Root diam: 2.80 cm Ao Asc diam:  3.50 cm MITRAL VALVE                  TRICUSPID VALVE MV Area (PHT): 4.68 cm       TR Peak grad:   37.9 mmHg MV Area VTI:   1.75 cm       TR Vmax:        308.00 cm/s MV Peak grad:  13.7 mmHg  MV Mean grad:  6.0 mmHg       SHUNTS MV Vmax:       1.85 m/s       Systemic VTI:  0.20 m MV Vmean:      114.0 cm/s     Systemic Diam: 2.20 cm MV Decel Time: 162 msec MR Peak grad:    114.9 mmHg MR Mean grad:    77.0 mmHg MR Vmax:         536.00 cm/s MR Vmean:        418.5 cm/s MR PISA:         9.05 cm MR PISA Eff ROA: 62 mm MR PISA Radius:  1.20 cm MV E velocity: 164.00 cm/s MV A velocity: 90.30 cm/s MV E/A ratio:  1.82 Jodelle Red MD Electronically signed by Jodelle Red MD Signature Date/Time: 11/06/2022/2:36:33 PM    Final    Medications:  sodium thiosulfate 25 g in sodium chloride 0.9 % 200 mL Infusion for Calciphylaxis     sodium thiosulfate 25 g in sodium chloride 0.9 % 200 mL Infusion for Calciphylaxis      aspirin EC  81 mg Oral Daily   atorvastatin  10 mg Oral Daily   dicyclomine  10 mg Oral TID   gabapentin  200 mg Oral Daily   heparin  5,000 Units Subcutaneous Q8H   levothyroxine  100 mcg Oral Q0600   melatonin  3 mg Oral QHS   midodrine  10 mg Oral Q M,W,F-2000   pantoprazole  40 mg Oral Daily   sucroferric oxyhydroxide  1,000 mg Oral TID WC

## 2022-11-09 DIAGNOSIS — I340 Nonrheumatic mitral (valve) insufficiency: Secondary | ICD-10-CM | POA: Diagnosis not present

## 2022-11-09 DIAGNOSIS — I5043 Acute on chronic combined systolic (congestive) and diastolic (congestive) heart failure: Secondary | ICD-10-CM | POA: Diagnosis not present

## 2022-11-09 DIAGNOSIS — I318 Other specified diseases of pericardium: Secondary | ICD-10-CM | POA: Diagnosis not present

## 2022-11-09 DIAGNOSIS — D151 Benign neoplasm of heart: Secondary | ICD-10-CM | POA: Diagnosis not present

## 2022-11-09 MED ORDER — LIDOCAINE 5 % EX PTCH
1.0000 | MEDICATED_PATCH | CUTANEOUS | Status: DC
  Filled 2022-11-09 (×2): qty 1

## 2022-11-09 MED ORDER — MELATONIN 5 MG PO TABS
5.0000 mg | ORAL_TABLET | Freq: Every day | ORAL | Status: DC
  Administered 2022-11-09: 5 mg via ORAL
  Filled 2022-11-09 (×3): qty 1

## 2022-11-09 MED ORDER — MENTHOL 3 MG MT LOZG
1.0000 | LOZENGE | OROMUCOSAL | Status: DC | PRN
  Administered 2022-11-09: 3 mg via ORAL
  Filled 2022-11-09: qty 9

## 2022-11-09 MED ORDER — SENNA 8.6 MG PO TABS
1.0000 | ORAL_TABLET | Freq: Every day | ORAL | Status: DC
  Administered 2022-11-09: 8.6 mg via ORAL
  Filled 2022-11-09 (×2): qty 1

## 2022-11-09 NOTE — Progress Notes (Signed)
Rounding Note    Patient Name: Courtney Gray Date of Encounter: 11/09/2022  [facility] Cardiologist: None   Subjective   No acute overnight events. She has chronic respiratory failure and is on O2 at home but denies any new or worsening shortness of breath. No chest pain. Tentative plan is for [facility] on 8/5 and TEE on 8/6.   AS I promised her yesterday, I wouldn't bring up the planned procedures that she's "tired of talking about." When asked if she has any questions, she had none and volunteered that she is still thinking about having the procedure tomorrow.  Inpatient Medications    Scheduled Meds:  aspirin EC  81 mg Oral Daily   atorvastatin  10 mg Oral Daily   dicyclomine  10 mg Oral TID   gabapentin  200 mg Oral Daily   heparin  5,000 Units Subcutaneous Q8H   levothyroxine  100 mcg Oral Q0600   melatonin  3 mg Oral QHS   midodrine  10 mg Oral Q M,W,F-2000   midodrine  10 mg Oral Once in dialysis   pantoprazole  40 mg Oral Daily   sucroferric oxyhydroxide  1,000 mg Oral TID WC   Continuous Infusions:  sodium thiosulfate 25 g in sodium chloride 0.9 % 200 mL Infusion for Calciphylaxis     PRN Meds: cyclobenzaprine, diphenhydrAMINE, oxyCODONE, polyethylene glycol   Vital Signs    Vitals:   11/08/22 1657 11/08/22 2107 11/08/22 2322 11/09/22 0429  BP: (!) 113/46 108/87 (!) 141/67 (!) 141/62  Pulse: (!) 53  81 79  Resp: 16 18 19 18   Temp: 97.7 F (36.5 C) 98.2 F (36.8 C) 97.8 F (36.6 C) 97.6 F (36.4 C)  TempSrc: Oral Oral Oral Oral  SpO2: 100% 100% 100% 100%  Weight:      Height:        Intake/Output Summary (Last 24 hours) at 11/09/2022 0900 Last data filed at 11/08/2022 1800 Gross per 24 hour  Intake 240 ml  Output 2050 ml  Net -1810 ml      11/08/2022    4:20 PM 11/08/2022   11:27 AM 11/08/2022    4:35 AM  Last 3 Weights  Weight (lbs) -- -- 285 lb 15 oz  Weight (kg) -- -- 129.7 kg      Telemetry    Normal sinus rhythm with rates in the  70s to 80s. - Personally Reviewed  ECG    No new ECG tracing today. - Personally Reviewed  Physical Exam   GEN: Morbidly obese African-American. No acute distress.  On 3L of O2 via nasal cannula. Neck: JVD difficult to assess due to body habitus. Cardiac: RRR. II/VI systolic murmur. No rubs or gallops. Respiratory: Clear to auscultation bilaterally. No wheezes, rhonchi, or rales. GI: Soft, non-distended, and non-tender. MS: S/p bilateral above knee amputations. Neuro:  No focal deficits. Psych: Normal affect. Responds appropriately.  Labs    High Sensitivity Troponin:   Recent Labs  Lab 11/05/22 1041 11/05/22 1241  TROPONINIHS 242* 239*     Chemistry Recent Labs  Lab 11/05/22 1041 11/05/22 1155 11/06/22 0310 11/07/22 0703 11/08/22 1150  NA 137   < > 133* 134* 135  K 5.3*   < > 5.9* 4.7 5.2*  CL 94*  --  93* 94* 93*  CO2 23  --  22 26 23   GLUCOSE 87  --  107* 106* 100*  BUN 55*  --  66* 37* 57*  CREATININE 9.93*  --  11.24* 7.45* 9.41*  CALCIUM 9.4  --  9.3 9.7 9.0  PROT 8.1  --   --   --   --   ALBUMIN 3.7  --   --  3.6  --   AST 23  --   --   --   --   ALT 21  --   --   --   --   ALKPHOS 80  --   --   --   --   BILITOT 0.8  --   --   --   --   GFRNONAA 4*  --  4* 6* 5*  ANIONGAP 20*  --  18* 14 19*   < > = values in this interval not displayed.    Lipids No results for input(s): "CHOL", "TRIG", "HDL", "LABVLDL", "LDLCALC", "CHOLHDL" in the last 168 hours.  Hematology Recent Labs  Lab 11/06/22 0310 11/07/22 0703 11/08/22 1150  WBC 7.1 5.7 6.6  RBC 3.49* 3.65* 3.46*  HGB 10.6* 10.7* 10.2*  HCT 33.5* 35.1* 33.2*  MCV 96.0 96.2 96.0  MCH 30.4 29.3 29.5  MCHC 31.6 30.5 30.7  RDW 15.9* 15.6* 15.6*  PLT 103* 99* 116*   Thyroid  Recent Labs  Lab 11/06/22 0310  TSH 4.008    BNP Recent Labs  Lab 11/05/22 1041  BNP 1,832.9*    DDimer No results for input(s): "DDIMER" in the last 168 hours.   Radiology    No results found.  Cardiac Studies    TTE 09/10/2021: Impressions:  1. Poor quality study. Globally LV function appears normal.   2. Left ventricular ejection fraction, by estimation, is 50 to 55%. The  left ventricle has low normal function. Left ventricular endocardial  border not optimally defined to evaluate regional wall motion. There is  moderate concentric left ventricular  hypertrophy. Left ventricular diastolic parameters are consistent with  Grade II diastolic dysfunction (pseudonormalization).   3. Right ventricular systolic function was not well visualized. The right  ventricular size is not well visualized. There is mildly elevated  pulmonary artery systolic pressure.   4. Left atrial size was mild to moderately dilated.   5. The mitral valve is degenerative. Trivial mitral valve regurgitation.  No evidence of mitral stenosis.   6. Tricuspid valve regurgitation is mild to moderate.   7. The aortic valve is tricuspid. Aortic valve regurgitation is not  visualized. Mild aortic valve stenosis.   8. The inferior vena cava is normal in size with <50% respiratory  variability, suggesting right atrial pressure of 8 mmHg.  _______________     TEE 09/27/2021: Impression:  1. Dialysis catheter noted in SVC; calcified right atrial mass noted  (etiology unclear; possible calcified thrombus); if pt can tolerate,  cardiac MRI could provide more definitive evaluation.   2. Left ventricular ejection fraction, by estimation, is 65 to 70%. The  left ventricle has normal function. The left ventricle has no regional  wall motion abnormalities.   3. Right ventricular systolic function is normal. The right ventricular  size is normal.   4. Left atrial size was mildly dilated. No left atrial/left atrial  appendage thrombus was detected.   5. Right atrial size was mildly dilated.   6. The mitral valve is normal in structure. Mild mitral valve  regurgitation. Moderate mitral annular calcification.   7. Tricuspid valve  regurgitation is moderate.   8. The aortic valve is tricuspid. Aortic valve regurgitation is not  visualized. Aortic valve sclerosis is present, with  no evidence of aortic  valve stenosis.   9. There is mild (Grade II) plaque involving the descending aorta.  10. Evidence of atrial level shunting detected by color flow Doppler.  Agitated saline contrast bubble study was positive with shunting observed  within 3-6 cardiac cycles suggestive of interatrial shunt.  _______________  TTE 11/06/2022: 1. Left ventricular ejection fraction, by estimation, is 30 to 35%. The  left ventricle has moderately decreased function. The left ventricle  demonstrates global hypokinesis. The left ventricular internal cavity size  was moderately dilated. There is  moderate concentric left ventricular hypertrophy. Left ventricular  diastolic parameters are consistent with Grade II diastolic dysfunction  (pseudonormalization).   2. Right ventricular systolic function is low normal. The right  ventricular size is not well visualized. There is moderately elevated  pulmonary artery systolic pressure.   3. Echodense area visualized in several views but not well seen. Cannot  exclude mass. Left atrial size was moderately dilated.   4. Right atrial size was moderately dilated.   5. The mitral valve is degenerative. Severe mitral valve regurgitation.  Severe mitral annular calcification.   6. Tricuspid valve regurgitation is mild to moderate.   7. The aortic valve is grossly normal. There is moderate calcification of  the aortic valve. There is moderate thickening of the aortic valve. Aortic  valve regurgitation is trivial. Mild aortic valve stenosis. Aortic valve  mean gradient measures 11.5  mmHg.   8. The inferior vena cava is dilated in size with >50% respiratory  variability, suggesting right atrial pressure of 8 mmHg    Patient Profile     52 y.o. 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 Cardiology was consulted for elevated troponin.   Assessment & Plan    Elevated Troponin Patient presented with generalized weakness and was found to have an elevated troponin. High-sensitivity troponin 242 >> 239. No acute ischemic EKG changes. Echo showed a drop in LVEF to 30-35% with global hypokinesis. - No chest pain. - Troponin elevation may be secondary to ESRD. However, she does have multiple CV risk factors including PAD. Given drop in EF, tentative plan is to proceed with [facility] on tomorrow with Dr. Gala Romney.  - NPO after midnight  Acute on Chronic Combined CHF with Newly Reduced EF Last Echo in 09/2021 showed LVEF of 50-55% with moderate LVH and grade 2 diastolic dysfunction. BNP elevated at 1,832 this admission. Chest x-ray showed enlarged cardiopericardial silhouette with vascular congestion. Echo showed LVEF of 30-35% with global hypokinesis, moderate LVH, and grade 2 diastolic dysfunction as well as low normal RV function. - Volume status difficult to assess due to morbid obesity but does not appear significantly volume overloaded. - Volume status managed via dialysis. - GDMT limited due to ESRD and hypotension on dialysis days. Could consider adding a beta-blocker (Toprol-XL or Coreg) on non-dialysis days.   Possible Calcified Thrombus on Dialysis Catheter Echodense Area in Left Atrium TEE in 09/2021 showed a calcified right atrial mass. Etiology was unclear but possible calcified thrombus. 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 Dialysis catheterization was  replaced at that time. TTE this admission  did not reveal a mass in the right atrium but did show an echodense area in the left atrium (mass cannot be excluded. Blood cultures this admission negative so far. - Plan is for TEE on Tuesday.  Severe Mitral Regurgitation Noted on Echo this admission. - Plan is for [facility] and TEE next week as above.   Hypertension BP soft at times but stable. - Continue Midodrine on dialysis days.   Hyperlipidemia LDL 23 in 08/2021. - Continue home Lipitor 10mg  daily.   ESRD On hemodialysis M/W/F. - Management per Nephrology.   Hyperkalemia - Given ESRD, will defer management to Nephrology and primary team.   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

## 2022-11-09 NOTE — Plan of Care (Signed)

## 2022-11-09 NOTE — Progress Notes (Addendum)
Subjective: Courtney Gray is a 52 y.o. female with a PMHx of HTN, ESRD on iHD, NSTEMI, obesity and DM who presents with generalized weakness and fatigue concerning for acute on chronic heart failure exacerbation found to have newly reduced EF (30-35%)  Endorses difficulty with sleep, feeling fatigued, and back pain. She is an advocate for herself and requests transparency between providers and herself. Continues to consider cath and TEE procedure for next week   Objective: Vital signs in last 24 hours: Vitals:   11/08/22 1657 11/08/22 2107 11/08/22 2322 11/09/22 0429  BP: (!) 113/46 108/87 (!) 141/67 (!) 141/62  Pulse: (!) 53  81 79  Resp: 16 18 19 18   Temp: 97.7 F (36.5 C) 98.2 F (36.8 C) 97.8 F (36.6 C) 97.6 F (36.4 C)  TempSrc: Oral Oral Oral Oral  SpO2: 100% 100% 100% 100%  Weight:      Height:       Weight change:   Intake/Output Summary (Last 24 hours) at 11/09/2022 0935 Last data filed at 11/08/2022 1800 Gross per 24 hour  Intake 240 ml  Output 2050 ml  Net -1810 ml   Physical Exam: BP (!) 141/62 (BP Location: Left Wrist)   Pulse 79   Temp 97.6 F (36.4 C) (Oral)   Resp 18   Ht 5\' 5"  (1.651 m) Comment: AKA--stated  Wt 129.7 kg   SpO2 100%   BMI 47.58 kg/m  General appearance: tired, alert, NAD  Head: Normocephalic, without obvious abnormality, atraumatic Lungs: CTAB, normal WOB, on 4L O2 nasal cannula   Heart: RRR, S1, S2 normal, no m/r/g Abdomen:  soft, NTND, no guarding  Extremities: bilateral above the knee amputations   Lab Results:    Latest Ref Rng & Units 11/08/2022   11:50 AM 11/07/2022    7:03 AM 11/06/2022    3:10 AM  CBC  WBC 4.0 - 10.5 K/uL 6.6  5.7  7.1   Hemoglobin 12.0 - 15.0 g/dL 16.1  09.6  04.5   Hematocrit 36.0 - 46.0 % 33.2  35.1  33.5   Platelets 150 - 400 K/uL 116  99  103       Latest Ref Rng & Units 11/08/2022   11:50 AM 11/07/2022    7:03 AM 11/06/2022    3:10 AM  CMP  Glucose 70 - 99 mg/dL 409  811  914   BUN 6 - 20 mg/dL 57   37  66   Creatinine 0.44 - 1.00 mg/dL 7.82  9.56  21.30   Sodium 135 - 145 mmol/L 135  134  133   Potassium 3.5 - 5.1 mmol/L 5.2  4.7  5.9   Chloride 98 - 111 mmol/L 93  94  93   CO2 22 - 32 mmol/L 23  26  22    Calcium 8.9 - 10.3 mg/dL 9.0  9.7  9.3    No results for input(s): "GLUCAP" in the last 72 hours.   Micro Results: Recent Results (from the past 240 hour(s))  Resp panel by RT-PCR (RSV, Flu A&B, Covid) Anterior Nasal Swab     Status: None   Collection Time: 11/05/22 10:41 AM   Specimen: Anterior Nasal Swab  Result Value Ref Range Status   SARS Coronavirus 2 by RT PCR NEGATIVE NEGATIVE Final   Influenza A by PCR NEGATIVE NEGATIVE Final   Influenza B by PCR NEGATIVE NEGATIVE Final    Comment: (NOTE) The Xpert Xpress SARS-CoV-2/FLU/RSV plus assay is intended as an aid in the  diagnosis of influenza from Nasopharyngeal swab specimens and should not be used as a sole basis for treatment. Nasal washings and aspirates are unacceptable for Xpert Xpress SARS-CoV-2/FLU/RSV testing.  Fact Sheet for Patients: BloggerCourse.com  Fact Sheet for Healthcare Providers: SeriousBroker.it  This test is not yet approved or cleared by the [place] FDA and has been authorized for detection and/or diagnosis of SARS-CoV-2 by FDA under an Emergency Use Authorization (EUA). This EUA will remain in effect (meaning this test can be used) for the duration of the COVID-19 declaration under Section 564(b)(1) of the Act, 21 U.S.C. section 360bbb-3(b)(1), unless the authorization is terminated or revoked.     Resp Syncytial Virus by PCR NEGATIVE NEGATIVE Final    Comment: (NOTE) Fact Sheet for Patients: BloggerCourse.com  Fact Sheet for Healthcare Providers: SeriousBroker.it  This test is not yet approved or cleared by the [place] FDA and has been authorized for detection and/or  diagnosis of SARS-CoV-2 by FDA under an Emergency Use Authorization (EUA). This EUA will remain in effect (meaning this test can be used) for the duration of the COVID-19 declaration under Section 564(b)(1) of the Act, 21 U.S.C. section 360bbb-3(b)(1), unless the authorization is terminated or revoked.  Performed at [facility], [address] [place], [place] [address]   Culture, blood (Routine X 2) w Reflex to ID Panel     Status: None (Preliminary result)   Collection Time: 11/05/22  4:13 PM   Specimen: BLOOD  Result Value Ref Range Status   Specimen Description BLOOD LEFT UPPER HAND  Final   Special Requests   Final    BOTTLES DRAWN AEROBIC ONLY Blood Culture adequate volume   Culture   Final    NO GROWTH 4 DAYS Performed at [facility], [address] [place], [place] [address]    Report Status PENDING  Incomplete  Culture, blood (Routine X 2) w Reflex to ID Panel     Status: None (Preliminary result)   Collection Time: 11/05/22  6:30 PM   Specimen: BLOOD LEFT HAND  Result Value Ref Range Status   Specimen Description BLOOD LEFT HAND UPPER LOWER  Final   Special Requests   Final    BOTTLES DRAWN AEROBIC AND ANAEROBIC Blood Culture results may not be optimal due to an excessive volume of blood received in culture bottles   Culture   Final    NO GROWTH 4 DAYS Performed at [facility], [address], [place], [place] [address]    Report Status PENDING  Incomplete    Studies/Results: No results found.  Medications: I have reviewed the patient's current medications. 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     PRN Meds:.cyclobenzaprine, diphenhydrAMINE, oxyCODONE, polyethylene glycol  Assessment/Plan: Principal Problem:   Acute exacerbation of CHF (congestive heart  failure) (HCC) Active Problems:   ESRD on dialysis (HCC)   Chronic respiratory failure with hypoxia (HCC)   Elevated troponin level not due to acute coronary syndrome   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HCC)   Acute on chronic combined systolic and diastolic CHF (congestive heart failure) (HCC)   Generalized weakness   Elevated troponin   Severe mitral regurgitation   Pressure injury of right ischium, stage 4 (HCC)  Courtney Gray is a 52 y.o. female with a PMHx of HTN, ESRD on iHD, NSTEMI, obesity and DM who presents with generalized weakness and fatigue concerning for acute on chronic heart failure exacerbation found to have newly reduced EF (30-35%)   Active Problems   #Acute on chronic heart failure exacerbation with newly reduced EF (EF 30-35%) #Elevated troponins  H/o NSTEMI  #Echodense area c/f left atrial mass  TTE showed newly reduced EF at 30-35% with LVH, severe MR, mild-mod TR, and echodense area at left atria possible c/f mass. Last ECHO in 09/27/2021 showed EF 65-70% and noted a calcified right atrial mass with unclear etiology, possible calcified thrombus. Pursuing for workup for ischemic evaluation for cause of newly reduced EF. Denies anginal symptoms. Continue to manage volume with dialysis   -Cardiology consulted, appreciate recs, may consider adding a beta-blocker on non-dialysis days  -Cont bASA  -Daily BMP, CBC  -Continue nasal cannula, wean O2 as tolerated  -Plan for RHC/LHC on 8/5 -Plan for TEE on 8/6 -Volume control with dialysis  -NPO midnight   #ESRD  #Hyperkalemia, resolved  #H/o chronic anemia #Hyperphosphatemia   Underwent dialysis on Saturday. Will resume original MWF schedule. Access via L internal jugular tunneled dialysis catheter. Found to have low immunity to Hepatitis B. May consider HBV vaccine. No growth on blood cultures    -Nephrology consulted, appreciate recs, resume dialysis on 8/3 -Consider HBV vaccine 2-dose series for dialysis  -Cont midodrine 10mg  during dialysis days  -Cont Velphoro 1000mg  TID   #Insomnia  #Fatigue  Multiple possible etiologies due to chronic conditions. Newly reduced EF likely contributor for recent changes.    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   -WOCN consulted, appreciate recs, cleaned with normal saline -Ischial dressing in place    Chronic Problems   #Chronic back pain: Start lidocaine patch. Oxycodone and Flexeril PRN   #Hypothyroidism: Cont home levothyroxine 100 mcg p.o. daily   #T2DM: A1C 5.1 on June 2024. Diet controlled   #HLD: LDL 23 in 08/2021. Cont home atorvastatin 10 mg daily    #GERD: Cont home Protonix 40mg  daily    #Phantom leg pain: Takes Gabapentin and Lyrica at home. Will continue home gabapentin 200mg  daily and Lyrica 50mg  daily    #HTN: Previously prescribed ACEi. Not currently taking any home hypertensives. BP stable    #Chronic respiratory failure: uses 3-4L oxygen at home as needed. Currently on nasal cannula. Wean O2 as tolerated    #Peripheral artery disease: s/p bilateral AKA. Managed with ASA and atorvastatin as listed above     #Constipation: Miralax PRN    DVT prophx: declined DVT prophylaxis per patient   Diet: Renal Bowel: PRN Code: Full    Prior to Admission Living Arrangement: [facility] Anticipated Discharge Location: [facility]F - [facility]  Barriers to Discharge: Medical Workup   Dispo: Admit patient to inpatient with expected length of stay more than  2  midnights.  This is a Psychologist, occupational Note.  The care of the patient was discussed with Dr. Thomasene Ripple and the assessment and plan formulated with their assistance.  Please see their attached note for official documentation of the daily encounter.   LOS: 2 days   Loyal Buba 11/09/2022, 9:35 AM   Attestation for Student Documentation:  I personally was present and re-performed the history, physical exam and medical decision-making activities of this service and have verified that the service and findings are accurately documented in the student's note.  Olegario Messier, MD 11/09/2022, 10:51 AM

## 2022-11-09 NOTE — Progress Notes (Signed)
Subjective:   Seen in room - had HD yest 2L UF.  Tolerated fine but did have mid dose of midodrine.  NO issues today.  Tentative plan is for [facility] on 8/5 and TEE on 8/6 but pt still thinking about it  Objective Vital signs in last 24 hours: Vitals:   11/08/22 2107 11/08/22 2322 11/09/22 0429 11/09/22 1033  BP: 108/87 (!) 141/67 (!) 141/62 (!) 131/55  Pulse:  81 79 88  Resp: 18 19 18 17   Temp: 98.2 F (36.8 C) 97.8 F (36.6 C) 97.6 F (36.4 C) 97.9 F (36.6 C)  TempSrc: Oral Oral Oral Oral  SpO2: 100% 100% 100% 100%  Weight:      Height:       Weight change:   Physical Exam: General: Alert  Obese, chronically ill appearing Female, NAD  Heart: RRR, 1/6/sem, no rg Lungs: CTA  but distant sounds  2/2 body habitus /  2 l [place] O2, nonlabored  Abdomen: Morbid obese, NABS, soft Extremities: Bilat AKA, no stump edema,  Dialysis Access: L  internal jugular TDC       OP Dialysis Orders: Center: [facility] MWF   . 4hrs EDW 119,  2k, 2ca bath  Hep 5000 units, L internal jugular TDC  HD Bath Mircera 50 mcg  (last given 11/03/22) Venofer 50 mg  w wkly hd    Hectorol 4 mcg   Sodium thiosulfate 12.5g post HD   Assessment/Plan Weakness=  now thought to be cardiac related --> EF 30-35%, severe MVR.  Cardiology following and w/u underway - likely TEE and LHC next week.  Appreciate Dr. Fabio Bering thoughtful assessment.  ESRD -  HD MWF, had HD off schedule Thurs after missing Wed.  Declined HD Friday so had Sat.  Plans to resume tomorrow working around any procedures.   Chronic HF p EF = EF 30-35% cardiology following.   With severe MVR.  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7 3.6   Recent Labs  Lab 11/05/22 1041  LIPASE 36   No results for input(s): "AMMONIA" in the last 168 hours. CBC: Recent Labs  Lab 11/05/22 1041 11/05/22 1155 11/06/22 0310 11/07/22 0703 11/08/22 1150  WBC 6.7  --  7.1 5.7 6.6  NEUTROABS 4.6  --   --  3.8  --   HGB 11.2*   < > 10.6* 10.7* 10.2*  HCT 36.5   < > 33.5* 35.1* 33.2*  MCV 95.3  --  96.0 96.2 96.0  PLT 96*  --  103* 99* 116*   < > = values in this interval not displayed.   Cardiac Enzymes: No results for input(s): "CKTOTAL", "CKMB", "CKMBINDEX", "TROPONINI" in the last 168 hours. CBG: Recent Labs  Lab 11/05/22 1102 11/05/22 1232  GLUCAP 69* 82    Studies/Results: No results found. 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

## 2022-11-09 NOTE — H&P (View-Only) (Signed)
Rounding Note    Patient Name: Courtney Gray Date of Encounter: 11/09/2022  [facility] Cardiologist: None   Subjective   No acute overnight events. She has chronic respiratory failure and is on O2 at home but denies any new or worsening shortness of breath. No chest pain. Tentative plan is for [facility] on 8/5 and TEE on 8/6.   AS I promised her yesterday, I wouldn't bring up the planned procedures that she's "tired of talking about." When asked if she has any questions, she had none and volunteered that she is still thinking about having the procedure tomorrow.  Inpatient Medications    Scheduled Meds:  aspirin EC  81 mg Oral Daily   atorvastatin  10 mg Oral Daily   dicyclomine  10 mg Oral TID   gabapentin  200 mg Oral Daily   heparin  5,000 Units Subcutaneous Q8H   levothyroxine  100 mcg Oral Q0600   melatonin  3 mg Oral QHS   midodrine  10 mg Oral Q M,W,F-2000   midodrine  10 mg Oral Once in dialysis   pantoprazole  40 mg Oral Daily   sucroferric oxyhydroxide  1,000 mg Oral TID WC   Continuous Infusions:  sodium thiosulfate 25 g in sodium chloride 0.9 % 200 mL Infusion for Calciphylaxis     PRN Meds: cyclobenzaprine, diphenhydrAMINE, oxyCODONE, polyethylene glycol   Vital Signs    Vitals:   11/08/22 1657 11/08/22 2107 11/08/22 2322 11/09/22 0429  BP: (!) 113/46 108/87 (!) 141/67 (!) 141/62  Pulse: (!) 53  81 79  Resp: 16 18 19 18   Temp: 97.7 F (36.5 C) 98.2 F (36.8 C) 97.8 F (36.6 C) 97.6 F (36.4 C)  TempSrc: Oral Oral Oral Oral  SpO2: 100% 100% 100% 100%  Weight:      Height:        Intake/Output Summary (Last 24 hours) at 11/09/2022 0900 Last data filed at 11/08/2022 1800 Gross per 24 hour  Intake 240 ml  Output 2050 ml  Net -1810 ml      11/08/2022    4:20 PM 11/08/2022   11:27 AM 11/08/2022    4:35 AM  Last 3 Weights  Weight (lbs) -- -- 285 lb 15 oz  Weight (kg) -- -- 129.7 kg      Telemetry    Normal sinus rhythm with rates in the  70s to 80s. - Personally Reviewed  ECG    No new ECG tracing today. - Personally Reviewed  Physical Exam   GEN: Morbidly obese African-American. No acute distress.  On 3L of O2 via nasal cannula. Neck: JVD difficult to assess due to body habitus. Cardiac: RRR. II/VI systolic murmur. No rubs or gallops. Respiratory: Clear to auscultation bilaterally. No wheezes, rhonchi, or rales. GI: Soft, non-distended, and non-tender. MS: S/p bilateral above knee amputations. Neuro:  No focal deficits. Psych: Normal affect. Responds appropriately.  Labs    High Sensitivity Troponin:   Recent Labs  Lab 11/05/22 1041 11/05/22 1241  TROPONINIHS 242* 239*     Chemistry Recent Labs  Lab 11/05/22 1041 11/05/22 1155 11/06/22 0310 11/07/22 0703 11/08/22 1150  NA 137   < > 133* 134* 135  K 5.3*   < > 5.9* 4.7 5.2*  CL 94*  --  93* 94* 93*  CO2 23  --  22 26 23   GLUCOSE 87  --  107* 106* 100*  BUN 55*  --  66* 37* 57*  CREATININE 9.93*  --  11.24* 7.45* 9.41*  CALCIUM 9.4  --  9.3 9.7 9.0  PROT 8.1  --   --   --   --   ALBUMIN 3.7  --   --  3.6  --   AST 23  --   --   --   --   ALT 21  --   --   --   --   ALKPHOS 80  --   --   --   --   BILITOT 0.8  --   --   --   --   GFRNONAA 4*  --  4* 6* 5*  ANIONGAP 20*  --  18* 14 19*   < > = values in this interval not displayed.    Lipids No results for input(s): "CHOL", "TRIG", "HDL", "LABVLDL", "LDLCALC", "CHOLHDL" in the last 168 hours.  Hematology Recent Labs  Lab 11/06/22 0310 11/07/22 0703 11/08/22 1150  WBC 7.1 5.7 6.6  RBC 3.49* 3.65* 3.46*  HGB 10.6* 10.7* 10.2*  HCT 33.5* 35.1* 33.2*  MCV 96.0 96.2 96.0  MCH 30.4 29.3 29.5  MCHC 31.6 30.5 30.7  RDW 15.9* 15.6* 15.6*  PLT 103* 99* 116*   Thyroid  Recent Labs  Lab 11/06/22 0310  TSH 4.008    BNP Recent Labs  Lab 11/05/22 1041  BNP 1,832.9*    DDimer No results for input(s): "DDIMER" in the last 168 hours.   Radiology    No results found.  Cardiac Studies    TTE 09/10/2021: Impressions:  1. Poor quality study. Globally LV function appears normal.   2. Left ventricular ejection fraction, by estimation, is 50 to 55%. The  left ventricle has low normal function. Left ventricular endocardial  border not optimally defined to evaluate regional wall motion. There is  moderate concentric left ventricular  hypertrophy. Left ventricular diastolic parameters are consistent with  Grade II diastolic dysfunction (pseudonormalization).   3. Right ventricular systolic function was not well visualized. The right  ventricular size is not well visualized. There is mildly elevated  pulmonary artery systolic pressure.   4. Left atrial size was mild to moderately dilated.   5. The mitral valve is degenerative. Trivial mitral valve regurgitation.  No evidence of mitral stenosis.   6. Tricuspid valve regurgitation is mild to moderate.   7. The aortic valve is tricuspid. Aortic valve regurgitation is not  visualized. Mild aortic valve stenosis.   8. The inferior vena cava is normal in size with <50% respiratory  variability, suggesting right atrial pressure of 8 mmHg.  _______________     TEE 09/27/2021: Impression:  1. Dialysis catheter noted in SVC; calcified right atrial mass noted  (etiology unclear; possible calcified thrombus); if pt can tolerate,  cardiac MRI could provide more definitive evaluation.   2. Left ventricular ejection fraction, by estimation, is 65 to 70%. The  left ventricle has normal function. The left ventricle has no regional  wall motion abnormalities.   3. Right ventricular systolic function is normal. The right ventricular  size is normal.   4. Left atrial size was mildly dilated. No left atrial/left atrial  appendage thrombus was detected.   5. Right atrial size was mildly dilated.   6. The mitral valve is normal in structure. Mild mitral valve  regurgitation. Moderate mitral annular calcification.   7. Tricuspid valve  regurgitation is moderate.   8. The aortic valve is tricuspid. Aortic valve regurgitation is not  visualized. Aortic valve sclerosis is present, with  no evidence of aortic  valve stenosis.   9. There is mild (Grade II) plaque involving the descending aorta.  10. Evidence of atrial level shunting detected by color flow Doppler.  Agitated saline contrast bubble study was positive with shunting observed  within 3-6 cardiac cycles suggestive of interatrial shunt.  _______________  TTE 11/06/2022: 1. Left ventricular ejection fraction, by estimation, is 30 to 35%. The  left ventricle has moderately decreased function. The left ventricle  demonstrates global hypokinesis. The left ventricular internal cavity size  was moderately dilated. There is  moderate concentric left ventricular hypertrophy. Left ventricular  diastolic parameters are consistent with Grade II diastolic dysfunction  (pseudonormalization).   2. Right ventricular systolic function is low normal. The right  ventricular size is not well visualized. There is moderately elevated  pulmonary artery systolic pressure.   3. Echodense area visualized in several views but not well seen. Cannot  exclude mass. Left atrial size was moderately dilated.   4. Right atrial size was moderately dilated.   5. The mitral valve is degenerative. Severe mitral valve regurgitation.  Severe mitral annular calcification.   6. Tricuspid valve regurgitation is mild to moderate.   7. The aortic valve is grossly normal. There is moderate calcification of  the aortic valve. There is moderate thickening of the aortic valve. Aortic  valve regurgitation is trivial. Mild aortic valve stenosis. Aortic valve  mean gradient measures 11.5  mmHg.   8. The inferior vena cava is dilated in size with >50% respiratory  variability, suggesting right atrial pressure of 8 mmHg    Patient Profile     52 y.o. 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 Cardiology was consulted for elevated troponin.   Assessment & Plan    Elevated Troponin Patient presented with generalized weakness and was found to have an elevated troponin. High-sensitivity troponin 242 >> 239. No acute ischemic EKG changes. Echo showed a drop in LVEF to 30-35% with global hypokinesis. - No chest pain. - Troponin elevation may be secondary to ESRD. However, she does have multiple CV risk factors including PAD. Given drop in EF, tentative plan is to proceed with [facility] on tomorrow with Dr. Gala Romney.  - NPO after midnight  Acute on Chronic Combined CHF with Newly Reduced EF Last Echo in 09/2021 showed LVEF of 50-55% with moderate LVH and grade 2 diastolic dysfunction. BNP elevated at 1,832 this admission. Chest x-ray showed enlarged cardiopericardial silhouette with vascular congestion. Echo showed LVEF of 30-35% with global hypokinesis, moderate LVH, and grade 2 diastolic dysfunction as well as low normal RV function. - Volume status difficult to assess due to morbid obesity but does not appear significantly volume overloaded. - Volume status managed via dialysis. - GDMT limited due to ESRD and hypotension on dialysis days. Could consider adding a beta-blocker (Toprol-XL or Coreg) on non-dialysis days.   Possible Calcified Thrombus on Dialysis Catheter Echodense Area in Left Atrium TEE in 09/2021 showed a calcified right atrial mass. Etiology was unclear but possible calcified thrombus. 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 Dialysis catheterization was  replaced at that time. TTE this admission  did not reveal a mass in the right atrium but did show an echodense area in the left atrium (mass cannot be excluded. Blood cultures this admission negative so far. - Plan is for TEE on Tuesday.  Severe Mitral Regurgitation Noted on Echo this admission. - Plan is for [facility] and TEE next week as above.   Hypertension BP soft at times but stable. - Continue Midodrine on dialysis days.   Hyperlipidemia LDL 23 in 08/2021. - Continue home Lipitor 10mg  daily.   ESRD On hemodialysis M/W/F. - Management per Nephrology.   Hyperkalemia - Given ESRD, will defer management to Nephrology and primary team.   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

## 2022-11-10 DIAGNOSIS — I5189 Other ill-defined heart diseases: Secondary | ICD-10-CM | POA: Diagnosis not present

## 2022-11-10 DIAGNOSIS — R7989 Other specified abnormal findings of blood chemistry: Secondary | ICD-10-CM | POA: Diagnosis not present

## 2022-11-10 DIAGNOSIS — J9611 Chronic respiratory failure with hypoxia: Secondary | ICD-10-CM | POA: Diagnosis not present

## 2022-11-10 DIAGNOSIS — I340 Nonrheumatic mitral (valve) insufficiency: Secondary | ICD-10-CM | POA: Diagnosis not present

## 2022-11-10 DIAGNOSIS — I5043 Acute on chronic combined systolic (congestive) and diastolic (congestive) heart failure: Secondary | ICD-10-CM | POA: Diagnosis not present

## 2022-11-10 MED ORDER — SODIUM CHLORIDE 0.9 % IV SOLN: INTRAVENOUS | Status: DC

## 2022-11-10 MED ORDER — FENTANYL 25 MCG/HR TD PT72
1.0000 | MEDICATED_PATCH | TRANSDERMAL | Status: DC
  Administered 2022-11-10: 1 via TRANSDERMAL
  Filled 2022-11-10: qty 1

## 2022-11-10 MED ORDER — SENNA 8.6 MG PO TABS
2.0000 | ORAL_TABLET | Freq: Every day | ORAL | Status: DC
  Administered 2022-11-10 – 2022-11-13 (×4): 17.2 mg via ORAL
  Filled 2022-11-10 (×4): qty 2

## 2022-11-10 MED ORDER — POLYETHYLENE GLYCOL 3350 17 G PO PACK
17.0000 g | PACK | Freq: Every day | ORAL | Status: DC
  Filled 2022-11-10: qty 1

## 2022-11-10 MED ORDER — HEPARIN SODIUM (PORCINE) 1000 UNIT/ML IJ SOLN
INTRAMUSCULAR | Status: AC
  Administered 2022-11-10: 1000 [IU] via INTRAVENOUS_CENTRAL
  Filled 2022-11-10: qty 5

## 2022-11-10 MED ORDER — DOCUSATE SODIUM 50 MG PO CAPS
50.0000 mg | ORAL_CAPSULE | Freq: Every day | ORAL | Status: DC
  Administered 2022-11-10 – 2022-11-11 (×2): 50 mg via ORAL
  Filled 2022-11-10 (×3): qty 1

## 2022-11-10 MED ORDER — DOCUSATE SODIUM 50 MG PO CAPS
50.0000 mg | ORAL_CAPSULE | Freq: Once | ORAL | Status: AC
  Administered 2022-11-10: 50 mg via ORAL
  Filled 2022-11-10: qty 1

## 2022-11-10 NOTE — Progress Notes (Signed)
Pt. Refused to come To HD, because she has not had a bowel movement. Notified MD Arlean Hopping

## 2022-11-10 NOTE — Care Management Important Message (Signed)
Important Message  Patient Details  Name: Courtney Gray MRN: [id number] Date of Birth: 02/14/1971   Medicare Important Message Given:  Yes     Renie Ora 11/10/2022, 9:12 AM

## 2022-11-10 NOTE — Progress Notes (Signed)
Subjective:   Seen in room. Says she won't go to HD or cath lab until her "laxative works".   Objective Vital signs in last 24 hours: Vitals:   11/10/22 0102 11/10/22 0518 11/10/22 0720 11/10/22 1140  BP: (!) 129/58 121/84 (!) 145/60 103/80  Pulse: 81 83 87 86  Resp: 18 18 19 17   Temp: 98.2 F (36.8 C) 98.3 F (36.8 C) 97.9 F (36.6 C) 97.6 F (36.4 C)  TempSrc: Oral Oral Oral Oral  SpO2: 100% 100% 100% 100%  Weight:      Height:       Weight change:   Physical Exam: General: Alert  Obese, chronically ill appearing Female, NAD  Heart: RRR, 1/6/sem, no rg Lungs: CTA  but distant sounds  2/2 body habitus /  2 l [place] O2, nonlabored  Abdomen: Morbid obese, NABS, soft Extremities: Bilat AKA, no stump edema,  Dialysis Access: L  internal jugular TDC       OP HD: [facility] MWF   . 4h  119kg  2/2 bath  Heparin 5000  [facility] TDC  Mircera 50 mcg  (last given 11/03/22) Venofer 50 mg  w wkly hd   Hectorol 4 mcg   Sodium thiosulfate 12.5g post HD   Assessment/Plan Weakness=  now thought to be cardiac related --> EF 30-35%, severe MVR.  Cardiology following and w/u underway - likely TEE and LHC this week.   ESRD -  HD MWF, had HD off schedule Thurs after missing Wed.  Declined HD Friday so had HD Sat.  Plans to resume today.  Chronic HF p EF = EF 30-35% cardiology following.   With severe MVR.  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    Inpatient medications:  aspirin EC  81 mg Oral Daily   atorvastatin  10 mg Oral Daily   dicyclomine  10 mg Oral TID   docusate sodium  50 mg Oral Daily   fentaNYL  1 patch Transdermal Q72H   gabapentin  200 mg Oral Daily   heparin  5,000 Units Subcutaneous Q8H   levothyroxine  100 mcg Oral Q0600   melatonin  5 mg Oral QHS   midodrine  10 mg Oral Q M,W,F-2000   midodrine  10 mg Oral Once in dialysis   pantoprazole  40 mg Oral Daily   polyethylene glycol  17 g Oral Daily   senna  2 tablet Oral Daily   sucroferric oxyhydroxide  1,000 mg Oral TID WC    sodium chloride 10 mL/hr at 11/10/22 1610   sodium thiosulfate 25 g in sodium chloride 0.9 % 200 mL Infusion for Calciphylaxis     cyclobenzaprine, diphenhydrAMINE, menthol-cetylpyridinium, oxyCODONE

## 2022-11-10 NOTE — Progress Notes (Signed)
Rounding Note    Patient Name: Courtney Gray Date of Encounter: 11/10/2022  [facility] Cardiologist: None   Subjective   Reports shortness of breath  Inpatient Medications    Scheduled Meds:  aspirin EC  81 mg Oral Daily   atorvastatin  10 mg Oral Daily   dicyclomine  10 mg Oral TID   gabapentin  200 mg Oral Daily   heparin  5,000 Units Subcutaneous Q8H   levothyroxine  100 mcg Oral Q0600   lidocaine  1 patch Transdermal Q24H   melatonin  5 mg Oral QHS   midodrine  10 mg Oral Q M,W,F-2000   midodrine  10 mg Oral Once in dialysis   pantoprazole  40 mg Oral Daily   senna  2 tablet Oral Daily   sucroferric oxyhydroxide  1,000 mg Oral TID WC   Continuous Infusions:  sodium chloride 10 mL/hr at 11/10/22 1610   sodium thiosulfate 25 g in sodium chloride 0.9 % 200 mL Infusion for Calciphylaxis     PRN Meds: cyclobenzaprine, diphenhydrAMINE, menthol-cetylpyridinium, oxyCODONE, polyethylene glycol   Vital Signs    Vitals:   11/09/22 2100 11/10/22 0102 11/10/22 0518 11/10/22 0720  BP: (!) 144/72 (!) 129/58 121/84 (!) 145/60  Pulse: 86 81 83 87  Resp: 18 18 18 19   Temp: 98 F (36.7 C) 98.2 F (36.8 C) 98.3 F (36.8 C) 97.9 F (36.6 C)  TempSrc: Oral Oral Oral Oral  SpO2: 95% 100% 100% 100%  Weight:      Height:        Intake/Output Summary (Last 24 hours) at 11/10/2022 0924 Last data filed at 11/09/2022 1840 Gross per 24 hour  Intake 480 ml  Output --  Net 480 ml      11/08/2022    4:20 PM 11/08/2022   11:27 AM 11/08/2022    4:35 AM  Last 3 Weights  Weight (lbs) -- -- 285 lb 15 oz  Weight (kg) -- -- 129.7 kg      Telemetry    Normal sinus rhythm  - Personally Reviewed  ECG    No new ECG tracing today. - Personally Reviewed  Physical Exam   GEN: Morbidly obese African-American. No acute distress.  On 3L of O2 via nasal cannula. Neck: JVD difficult to assess due to body habitus. Cardiac: RRR. II/VI systolic murmur. No rubs or  gallops. Respiratory: Clear to auscultation bilaterally. No wheezes, rhonchi, or rales. GI: Soft, non-distended, and non-tender. MS: S/p bilateral above knee amputations. Neuro:  No focal deficits. Psych: Normal affect. Responds appropriately.  Labs    High Sensitivity Troponin:   Recent Labs  Lab 11/05/22 1041 11/05/22 1241  TROPONINIHS 242* 239*     Chemistry Recent Labs  Lab 11/05/22 1041 11/05/22 1155 11/07/22 0703 11/08/22 1150 11/10/22 0042  NA 137   < > 134* 135 133*  K 5.3*   < > 4.7 5.2* 4.6  CL 94*   < > 94* 93* 92*  CO2 23   < > 26 23 23   GLUCOSE 87   < > 106* 100* 126*  BUN 55*   < > 37* 57* 45*  CREATININE 9.93*   < > 7.45* 9.41* 7.90*  CALCIUM 9.4   < > 9.7 9.0 9.4  MG  --   --   --   --  1.8  PROT 8.1  --   --   --   --   ALBUMIN 3.7  --  3.6  --   --  AST 23  --   --   --   --   ALT 21  --   --   --   --   ALKPHOS 80  --   --   --   --   BILITOT 0.8  --   --   --   --   GFRNONAA 4*   < > 6* 5* 6*  ANIONGAP 20*   < > 14 19* 18*   < > = values in this interval not displayed.    Lipids No results for input(s): "CHOL", "TRIG", "HDL", "LABVLDL", "LDLCALC", "CHOLHDL" in the last 168 hours.  Hematology Recent Labs  Lab 11/07/22 0703 11/08/22 1150 11/10/22 0042  WBC 5.7 6.6 6.4  RBC 3.65* 3.46* 3.33*  HGB 10.7* 10.2* 9.7*  HCT 35.1* 33.2* 31.4*  MCV 96.2 96.0 94.3  MCH 29.3 29.5 29.1  MCHC 30.5 30.7 30.9  RDW 15.6* 15.6* 16.2*  PLT 99* 116* 123*   Thyroid  Recent Labs  Lab 11/06/22 0310  TSH 4.008    BNP Recent Labs  Lab 11/05/22 1041  BNP 1,832.9*    DDimer No results for input(s): "DDIMER" in the last 168 hours.   Radiology    No results found.  Cardiac Studies   TTE 09/10/2021: Impressions:  1. Poor quality study. Globally LV function appears normal.   2. Left ventricular ejection fraction, by estimation, is 50 to 55%. The  left ventricle has low normal function. Left ventricular endocardial  border not optimally defined  to evaluate regional wall motion. There is  moderate concentric left ventricular  hypertrophy. Left ventricular diastolic parameters are consistent with  Grade II diastolic dysfunction (pseudonormalization).   3. Right ventricular systolic function was not well visualized. The right  ventricular size is not well visualized. There is mildly elevated  pulmonary artery systolic pressure.   4. Left atrial size was mild to moderately dilated.   5. The mitral valve is degenerative. Trivial mitral valve regurgitation.  No evidence of mitral stenosis.   6. Tricuspid valve regurgitation is mild to moderate.   7. The aortic valve is tricuspid. Aortic valve regurgitation is not  visualized. Mild aortic valve stenosis.   8. The inferior vena cava is normal in size with <50% respiratory  variability, suggesting right atrial pressure of 8 mmHg.  _______________     TEE 09/27/2021: Impression:  1. Dialysis catheter noted in SVC; calcified right atrial mass noted  (etiology unclear; possible calcified thrombus); if pt can tolerate,  cardiac MRI could provide more definitive evaluation.   2. Left ventricular ejection fraction, by estimation, is 65 to 70%. The  left ventricle has normal function. The left ventricle has no regional  wall motion abnormalities.   3. Right ventricular systolic function is normal. The right ventricular  size is normal.   4. Left atrial size was mildly dilated. No left atrial/left atrial  appendage thrombus was detected.   5. Right atrial size was mildly dilated.   6. The mitral valve is normal in structure. Mild mitral valve  regurgitation. Moderate mitral annular calcification.   7. Tricuspid valve regurgitation is moderate.   8. The aortic valve is tricuspid. Aortic valve regurgitation is not  visualized. Aortic valve sclerosis is present, with no evidence of aortic  valve stenosis.   9. There is mild (Grade II) plaque involving the descending aorta.  10. Evidence of  atrial level shunting detected by color flow Doppler.  Agitated saline contrast bubble study was  positive with shunting observed  within 3-6 cardiac cycles suggestive of interatrial shunt.  _______________  TTE 11/06/2022: 1. Left ventricular ejection fraction, by estimation, is 30 to 35%. The  left ventricle has moderately decreased function. The left ventricle  demonstrates global hypokinesis. The left ventricular internal cavity size  was moderately dilated. There is  moderate concentric left ventricular hypertrophy. Left ventricular  diastolic parameters are consistent with Grade II diastolic dysfunction  (pseudonormalization).   2. Right ventricular systolic function is low normal. The right  ventricular size is not well visualized. There is moderately elevated  pulmonary artery systolic pressure.   3. Echodense area visualized in several views but not well seen. Cannot  exclude mass. Left atrial size was moderately dilated.   4. Right atrial size was moderately dilated.   5. The mitral valve is degenerative. Severe mitral valve regurgitation.  Severe mitral annular calcification.   6. Tricuspid valve regurgitation is mild to moderate.   7. The aortic valve is grossly normal. There is moderate calcification of  the aortic valve. There is moderate thickening of the aortic valve. Aortic  valve regurgitation is trivial. Mild aortic valve stenosis. Aortic valve  mean gradient measures 11.5  mmHg.   8. The inferior vena cava is dilated in size with >50% respiratory  variability, suggesting right atrial pressure of 8 mmHg    Patient Profile     52 y.o. 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 Cardiology was consulted for elevated troponin.   Assessment & Plan    Elevated Troponin Patient presented with generalized weakness and was found to have an elevated troponin. High-sensitivity troponin 242 >> 239. No acute ischemic EKG changes. Echo showed a drop in LVEF to 30-35% with global hypokinesis. - No chest pain. - Troponin elevation may be secondary to ESRD. However, she does have multiple CV risk factors including PAD. Given drop in EF,  plan is to proceed with [facility] today with Dr. Gala Romney. Risks and benefits of cardiac catheterization have been discussed with the patient.  These include bleeding, infection, kidney damage, stroke, heart attack, death.  The patient understands these risks and is willing to proceed.    Acute on Chronic Combined CHF with Newly Reduced EF Last Echo in 09/2021 showed LVEF of 50-55% with moderate LVH and grade 2 diastolic dysfunction. BNP elevated at 1,832 this admission. Chest x-ray showed enlarged cardiopericardial silhouette with vascular congestion. Echo showed LVEF of 30-35% with global hypokinesis, moderate LVH, and grade 2 diastolic dysfunction as well as low normal RV function. - Volume status difficult to assess due to morbid obesity but does not appear significantly volume overloaded. - Volume status managed via dialysis. - GDMT limited due to ESRD and hypotension on dialysis days   Possible Calcified Thrombus on Dialysis Catheter Echodense Area in Left Atrium TEE in 09/2021 showed a calcified right atrial mass. Etiology was unclear but possible calcified thrombus. 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 Dialysis catheterization was replaced at that time. TTE this admission did not reveal a mass in the right atrium but did show an echodense area in the left atrium (mass cannot  be excluded. Blood  cultures this admission negative so far. - Plan is for TEE on Tuesday.   Severe Mitral Regurgitation Noted on Echo this admission. - Plan is for [facility] and TEE as above.   Hypertension BP soft at times but stable. - Continue Midodrine on dialysis days.   Hyperlipidemia LDL 23 in 08/2021. - Continue home Lipitor 10mg  daily.   ESRD On hemodialysis M/W/F. - Management per Nephrology.     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 Please consult [email or web address] for contact info under        Signed, Little Ishikawa, MD  11/10/2022, 9:24 AM

## 2022-11-10 NOTE — Progress Notes (Signed)
Patient off the floor to dialysis.

## 2022-11-10 NOTE — Plan of Care (Signed)

## 2022-11-10 NOTE — Progress Notes (Signed)
Subjective: Courtney Gray is a 52 y.o. female with a PMHx of HTN, ESRD on iHD, NSTEMI, obesity and DM who presents with generalized weakness and fatigue concerning for acute on chronic heart failure exacerbation found to have newly reduced EF (30-35%)  Has not had a recent BM and would like to postpone RHC/LHC this AM. Endorses chronic back pain that does not relieve with lidocaine patch.   Objective: Vital signs in last 24 hours: Vitals:   11/09/22 2100 11/10/22 0102 11/10/22 0518 11/10/22 0720  BP: (!) 144/72 (!) 129/58 121/84 (!) 145/60  Pulse: 86 81 83 87  Resp: 18 18 18 19   Temp: 98 F (36.7 C) 98.2 F (36.8 C) 98.3 F (36.8 C) 97.9 F (36.6 C)  TempSrc: Oral Oral Oral Oral  SpO2: 95% 100% 100% 100%  Weight:      Height:       Weight change:   Intake/Output Summary (Last 24 hours) at 11/10/2022 1102 Last data filed at 11/09/2022 1840 Gross per 24 hour  Intake 480 ml  Output --  Net 480 ml   Physical Exam: BP (!) 145/60 (BP Location: Left Wrist)   Pulse 87   Temp 97.9 F (36.6 C) (Oral)   Resp 19   Ht 5\' 5"  (1.651 m) Comment: AKA--stated  Wt 129.7 kg   SpO2 100%   BMI 47.58 kg/m  General appearance: tired, alert, NAD  Head: Normocephalic, without obvious abnormality, atraumatic Lungs: CTAB, normal WOB, on 4L O2 nasal cannula   Heart: Systolic murmur near dialysis access, S1, S2 normal  Abdomen:  soft, NTND, no guarding  Extremities: bilateral above the knee amputations   Lab Results:    Latest Ref Rng & Units 11/10/2022   12:42 AM 11/08/2022   11:50 AM 11/07/2022    7:03 AM  CBC  WBC 4.0 - 10.5 K/uL 6.4  6.6  5.7   Hemoglobin 12.0 - 15.0 g/dL 9.7  11.9  14.7   Hematocrit 36.0 - 46.0 % 31.4  33.2  35.1   Platelets 150 - 400 K/uL 123  116  99       Latest Ref Rng & Units 11/10/2022   12:42 AM 11/08/2022   11:50 AM 11/07/2022    7:03 AM  CMP  Glucose 70 - 99 mg/dL 829  562  130   BUN 6 - 20 mg/dL 45  57  37   Creatinine 0.44 - 1.00 mg/dL 8.65  7.84  6.96    Sodium 135 - 145 mmol/L 133  135  134   Potassium 3.5 - 5.1 mmol/L 4.6  5.2  4.7   Chloride 98 - 111 mmol/L 92  93  94   CO2 22 - 32 mmol/L 23  23  26    Calcium 8.9 - 10.3 mg/dL 9.4  9.0  9.7    No results for input(s): "GLUCAP" in the last 72 hours.   Micro Results: Recent Results (from the past 240 hour(s))  Resp panel by RT-PCR (RSV, Flu A&B, Covid) Anterior Nasal Swab     Status: None   Collection Time: 11/05/22 10:41 AM   Specimen: Anterior Nasal Swab  Result Value Ref Range Status   SARS Coronavirus 2 by RT PCR NEGATIVE NEGATIVE Final   Influenza A by PCR NEGATIVE NEGATIVE Final   Influenza B by PCR NEGATIVE NEGATIVE Final    Comment: (NOTE) The Xpert Xpress SARS-CoV-2/FLU/RSV plus assay is intended as an aid in the diagnosis of influenza from Nasopharyngeal swab specimens and  should not be used as a sole basis for treatment. Nasal washings and aspirates are unacceptable for Xpert Xpress SARS-CoV-2/FLU/RSV testing.  Fact Sheet for Patients: BloggerCourse.com  Fact Sheet for Healthcare Providers: SeriousBroker.it  This test is not yet approved or cleared by the [place] FDA and has been authorized for detection and/or diagnosis of SARS-CoV-2 by FDA under an Emergency Use Authorization (EUA). This EUA will remain in effect (meaning this test can be used) for the duration of the COVID-19 declaration under Section 564(b)(1) of the Act, 21 U.S.C. section 360bbb-3(b)(1), unless the authorization is terminated or revoked.     Resp Syncytial Virus by PCR NEGATIVE NEGATIVE Final    Comment: (NOTE) Fact Sheet for Patients: BloggerCourse.com  Fact Sheet for Healthcare Providers: SeriousBroker.it  This test is not yet approved or cleared by the [place] FDA and has been authorized for detection and/or diagnosis of SARS-CoV-2 by FDA under an Emergency Use  Authorization (EUA). This EUA will remain in effect (meaning this test can be used) for the duration of the COVID-19 declaration under Section 564(b)(1) of the Act, 21 U.S.C. section 360bbb-3(b)(1), unless the authorization is terminated or revoked.  Performed at [facility], [address] [place], [place] [address]   Culture, blood (Routine X 2) w Reflex to ID Panel     Status: None   Collection Time: 11/05/22  4:13 PM   Specimen: BLOOD  Result Value Ref Range Status   Specimen Description BLOOD LEFT UPPER HAND  Final   Special Requests   Final    BOTTLES DRAWN AEROBIC ONLY Blood Culture adequate volume   Culture   Final    NO GROWTH 5 DAYS Performed at [facility], [address] [place], [place] [address]    Report Status 11/10/2022 FINAL  Final  Culture, blood (Routine X 2) w Reflex to ID Panel     Status: None   Collection Time: 11/05/22  6:30 PM   Specimen: BLOOD LEFT HAND  Result Value Ref Range Status   Specimen Description BLOOD LEFT HAND UPPER LOWER  Final   Special Requests   Final    BOTTLES DRAWN AEROBIC AND ANAEROBIC Blood Culture results may not be optimal due to an excessive volume of blood received in culture bottles   Culture   Final    NO GROWTH 5 DAYS Performed at [facility], [address] [place], [place] [address]    Report Status 11/10/2022 FINAL  Final    Studies/Results: No results found.  Medications: I have reviewed the patient's current medications. Scheduled Meds:  aspirin EC  81 mg Oral Daily   atorvastatin  10 mg Oral Daily   dicyclomine  10 mg Oral TID   gabapentin  200 mg Oral Daily   heparin  5,000 Units Subcutaneous Q8H   levothyroxine  100 mcg Oral Q0600   lidocaine  1 patch Transdermal Q24H   melatonin  5 mg Oral QHS   midodrine  10 mg Oral Q M,W,F-2000   midodrine  10 mg Oral Once in dialysis   pantoprazole  40 mg Oral Daily   senna  2 tablet Oral Daily   sucroferric oxyhydroxide  1,000 mg Oral TID WC    Continuous Infusions:  sodium chloride 10 mL/hr at 11/10/22 1308   sodium thiosulfate 25 g in sodium chloride 0.9 % 200 mL Infusion for Calciphylaxis     PRN Meds:.cyclobenzaprine, diphenhydrAMINE, menthol-cetylpyridinium, oxyCODONE, polyethylene glycol  Assessment/Plan: Principal Problem:  Acute exacerbation of CHF (congestive heart failure) (HCC) Active Problems:   ESRD on dialysis (HCC)   Chronic respiratory failure with hypoxia (HCC)   Elevated troponin level not due to acute coronary syndrome   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HCC)   Acute on chronic combined systolic and diastolic CHF (congestive heart failure) (HCC)   Generalized weakness   Elevated troponin   Severe mitral regurgitation   Pressure injury of right ischium, stage 4 (HCC)   Left atrial mass  Rocelia Dagraca is a 52 y.o. female with a PMHx of HTN, ESRD on iHD, NSTEMI, obesity and DM who presents with generalized weakness and fatigue concerning for acute on chronic heart failure exacerbation found to have newly reduced EF (30-35%)   Active Problems   #Acute on chronic heart failure exacerbation with newly reduced EF (EF 30-35%) #Elevated troponins  H/o NSTEMI  #Echodense area c/f left atrial mass  TTE showed newly reduced EF at 30-35% with LVH, severe MR, mild-mod TR, and echodense area at left atria possible c/f mass. Denies anginal symptoms. Continue to manage volume with dialysis. Plan for RHC/LHC and TEE for ischemic evaluation. Will need to re-discuss DVT ppx following RHC/LHC given patient has previously declined and her risk will increase following procedure     -Cardiology consulted, appreciate recs, may consider adding a beta-blocker on non-dialysis days  -Cont bASA  -Daily BMP, CBC  -Continue nasal cannula, wean O2 as tolerated  -Plan for RHC/LHC on 8/5, rescheduled to PM  -Plan for TEE on 8/6 -Volume control with dialysis  -NPO midnight   #ESRD  #Hyperkalemia, resolved  #H/o  chronic anemia #Hyperphosphatemia  Resume MWF schedule. Access via L internal jugular tunneled dialysis catheter. Found to have low immunity to Hepatitis B. May consider HBV vaccine. No growth on blood cultures    -Nephrology consulted, appreciate recs, resume dialysis on 8/3 -Consider HBV vaccine 2-dose series for dialysis  -Cont midodrine 10mg  during dialysis days  -Cont Velphoro 1000mg  TID   #Insomnia  #Fatigue  Multiple possible etiologies due to chronic conditions. Newly reduced EF likely contributor for recent changes.    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   -WOCN consulted, appreciate recs, cleaned with normal saline -Ischial dressing in place    #Chronic back pain States lidocaine patch does not relieve pain. Per chart review, she has been previously trialed on fentanyl patch.   -Start fentanyl patch 25ug every 72 hrs  -Flexeril PRN  -Oxycodone PRN    #Constipation:  No BM since hospitalization   -Miralax daily  -Senna 2 tabs daily   -Colace daily    Chronic Problems   #Hypothyroidism: Cont home levothyroxine 100 mcg p.o. daily   #T2DM: A1C 5.1 on June 2024. Diet controlled   #HLD: LDL 23 in 08/2021. Cont home atorvastatin 10 mg daily    #GERD: Cont home Protonix 40mg  daily    #Phantom leg pain: Takes Gabapentin and Lyrica at home. Will continue home gabapentin 200mg  daily and Lyrica 50mg  daily    #HTN: Previously prescribed ACEi. Not currently taking any home hypertensives. BP stable    #Chronic respiratory failure: uses 3-4L oxygen at home as needed. Currently on 4L nasal cannula. Wean O2 as tolerated    #Peripheral artery disease: s/p bilateral AKA. Managed with ASA and atorvastatin as listed above      DVT prophx:  declined DVT prophylaxis per patient   Diet: Renal Bowel: PRN Code: Full    Prior to Admission Living  Arrangement: [facility] Anticipated Discharge Location: [facility]F - [facility]  Barriers to Discharge: Medical Workup   Dispo: Admit patient to inpatient with expected length of stay more than 2 midnights.  This is a Psychologist, occupational Note.  The care of the patient was discussed with Dr. Thomasene Ripple and the assessment and plan formulated with their assistance.  Please see their attached note for official documentation of the daily encounter.   LOS: 2 days   Lona Millard, MS4 11/10/2022, 11:02 AM

## 2022-11-10 NOTE — Interval H&P Note (Signed)
History and Physical Interval Note:  11/10/2022 9:36 AM  Courtney Gray  has presented today for surgery, with the diagnosis of mr - heart failure.  The various methods of treatment have been discussed with the patient and family. After consideration of risks, benefits and other options for treatment, the patient has consented to  Procedure(s): RIGHT/LEFT HEART CATH AND CORONARY ANGIOGRAPHY (N/A) as a surgical intervention.  The patient's history has been reviewed, patient examined, no change in status, stable for surgery.  I have reviewed the patient's chart and labs.  Questions were answered to the patient's satisfaction.      

## 2022-11-10 NOTE — Plan of Care (Signed)
  Problem: Education: Goal: Knowledge of General Education information will improve Description: Including pain rating scale, medication(s)/side effects and non-pharmacologic comfort measures Outcome: Progressing   Problem: Health Behavior/Discharge Planning: Goal: Ability to manage health-related needs will improve Outcome: Progressing   Problem: Clinical Measurements: Goal: Ability to maintain clinical measurements within normal limits will improve Outcome: Progressing Goal: Will remain free from infection Outcome: Progressing Goal: Diagnostic test results will improve Outcome: Progressing Goal: Respiratory complications will improve Outcome: Progressing Goal: Cardiovascular complication will be avoided Outcome: Progressing   Problem: Activity: Goal: Risk for activity intolerance will decrease Outcome: Progressing   Problem: Nutrition: Goal: Adequate nutrition will be maintained Outcome: Progressing   Problem: Coping: Goal: Level of anxiety will decrease Outcome: Progressing   Problem: Elimination: Goal: Will not experience complications related to bowel motility Outcome: Progressing Goal: Will not experience complications related to urinary retention Outcome: Progressing   Problem: Pain Managment: Goal: General experience of comfort will improve Outcome: Progressing   Problem: Safety: Goal: Ability to remain free from injury will improve Outcome: Progressing   Problem: Skin Integrity: Goal: Risk for impaired skin integrity will decrease Outcome: Progressing   Problem: Education: Goal: Ability to demonstrate management of disease process will improve Outcome: Progressing Goal: Ability to verbalize understanding of medication therapies will improve Outcome: Progressing Goal: Individualized Educational Video(s) Outcome: Progressing   Problem: Activity: Goal: Capacity to carry out activities will improve Outcome: Progressing   Problem: Cardiac: Goal:  Ability to achieve and maintain adequate cardiopulmonary perfusion will improve Outcome: Progressing   Problem: Education: Goal: Understanding of CV disease, CV risk reduction, and recovery process will improve Outcome: Progressing Goal: Individualized Educational Video(s) Outcome: Progressing   Problem: Activity: Goal: Ability to return to baseline activity level will improve Outcome: Progressing   Problem: Cardiovascular: Goal: Ability to achieve and maintain adequate cardiovascular perfusion will improve Outcome: Progressing Goal: Vascular access site(s) Level 0-1 will be maintained Outcome: Progressing   Problem: Health Behavior/Discharge Planning: Goal: Ability to safely manage health-related needs after discharge will improve Outcome: Progressing   Problem: Education: Goal: Understanding of CV disease, CV risk reduction, and recovery process will improve Outcome: Progressing Goal: Individualized Educational Video(s) Outcome: Progressing   Problem: Activity: Goal: Ability to return to baseline activity level will improve Outcome: Progressing   Problem: Cardiovascular: Goal: Ability to achieve and maintain adequate cardiovascular perfusion will improve Outcome: Progressing Goal: Vascular access site(s) Level 0-1 will be maintained Outcome: Progressing   Problem: Health Behavior/Discharge Planning: Goal: Ability to safely manage health-related needs after discharge will improve Outcome: Progressing   

## 2022-11-11 ENCOUNTER — Other Ambulatory Visit: Payer: Self-pay

## 2022-11-11 ENCOUNTER — Encounter (HOSPITAL_COMMUNITY)
Admission: EM | Disposition: A | Discharge status: Skilled Nursing Facility | Payer: Self-pay | Source: Home / Self Care | Attending: Internal Medicine

## 2022-11-11 ENCOUNTER — Inpatient Hospital Stay (HOSPITAL_COMMUNITY): Payer: 59

## 2022-11-11 ENCOUNTER — Inpatient Hospital Stay (HOSPITAL_COMMUNITY): Payer: 59 | Admitting: Anesthesiology

## 2022-11-11 ENCOUNTER — Inpatient Hospital Stay (HOSPITAL_COMMUNITY): Payer: Self-pay | Admitting: Anesthesiology

## 2022-11-11 DIAGNOSIS — I5043 Acute on chronic combined systolic (congestive) and diastolic (congestive) heart failure: Secondary | ICD-10-CM | POA: Diagnosis not present

## 2022-11-11 DIAGNOSIS — Z992 Dependence on renal dialysis: Secondary | ICD-10-CM

## 2022-11-11 DIAGNOSIS — I340 Nonrheumatic mitral (valve) insufficiency: Secondary | ICD-10-CM

## 2022-11-11 DIAGNOSIS — I132 Hypertensive heart and chronic kidney disease with heart failure and with stage 5 chronic kidney disease, or end stage renal disease: Secondary | ICD-10-CM | POA: Diagnosis not present

## 2022-11-11 DIAGNOSIS — J9611 Chronic respiratory failure with hypoxia: Secondary | ICD-10-CM | POA: Diagnosis not present

## 2022-11-11 DIAGNOSIS — I5189 Other ill-defined heart diseases: Secondary | ICD-10-CM | POA: Diagnosis not present

## 2022-11-11 DIAGNOSIS — N186 End stage renal disease: Secondary | ICD-10-CM

## 2022-11-11 HISTORY — PX: TEE WITHOUT CARDIOVERSION: SHX5443

## 2022-11-11 LAB — ECHO TEE
MV M vel: 5.42 m/s
MV Peak grad: 117.5 mmHg
Radius: 0.95 cm

## 2022-11-11 LAB — GLUCOSE, CAPILLARY: Glucose-Capillary: 91 mg/dL (ref 70–99)

## 2022-11-11 SURGERY — RIGHT/LEFT HEART CATH AND CORONARY ANGIOGRAPHY
Anesthesia: LOCAL

## 2022-11-11 SURGERY — ECHOCARDIOGRAM, TRANSESOPHAGEAL
Anesthesia: Monitor Anesthesia Care

## 2022-11-11 MED ORDER — LIDOCAINE 2% (20 MG/ML) 5 ML SYRINGE
INTRAMUSCULAR | Status: DC | PRN
  Administered 2022-11-11: 60 mg via INTRAVENOUS

## 2022-11-11 MED ORDER — PROPOFOL 10 MG/ML IV BOLUS
INTRAVENOUS | Status: DC | PRN
  Administered 2022-11-11: 100 ug/kg/min via INTRAVENOUS

## 2022-11-11 MED ORDER — BISACODYL 10 MG RE SUPP
10.0000 mg | Freq: Once | RECTAL | Status: AC
  Administered 2022-11-11: 10 mg via RECTAL
  Filled 2022-11-11: qty 1

## 2022-11-11 MED ORDER — PHENYLEPHRINE 80 MCG/ML (10ML) SYRINGE FOR IV PUSH (FOR BLOOD PRESSURE SUPPORT)
PREFILLED_SYRINGE | INTRAVENOUS | Status: DC | PRN
  Administered 2022-11-11 (×2): 160 ug via INTRAVENOUS

## 2022-11-11 NOTE — Plan of Care (Signed)

## 2022-11-11 NOTE — Progress Notes (Signed)
Subjective: Courtney Gray is a 52 y.o. female with a PMHx of HTN, ESRD on iHD, NSTEMI, obesity and DM who presents with generalized weakness and fatigue concerning for acute on chronic heart failure exacerbation found to have newly reduced EF (30-35%)  Had a BM this AM following suppository. Went for dialysis yesterday night. Completed TEE this AM. Feeling slightly sedated after return from procedure. Denies any pain   Objective: Vital signs in last 24 hours: Vitals:   11/11/22 0850 11/11/22 0900 11/11/22 0910 11/11/22 0938  BP: (!) 119/47 (!) 119/34 (!) 121/34   Pulse: 85 87 87 85  Resp: 15 (!) 28 (!) 22   Temp:    98.2 F (36.8 C)  TempSrc:      SpO2: 93% 93% 94%   Weight:      Height:       Weight change:   Intake/Output Summary (Last 24 hours) at 11/11/2022 1147 Last data filed at 11/11/2022 5784 Gross per 24 hour  Intake 521.82 ml  Output 3200 ml  Net -2678.18 ml   Physical Exam: BP (!) 121/34   Pulse 85   Temp 98.2 F (36.8 C)   Resp (!) 22   Ht 5\' 5"  (1.651 m)   Wt 117.9 kg   SpO2 94%   BMI 43.27 kg/m  General appearance: mildly sedated following procedure, NAD  Head: Normocephalic, without obvious abnormality, atraumatic Lungs: CTAB, normal WOB, on 4L O2 nasal cannula   Heart: Systolic murmur near dialysis access, S1, S2 normal  Abdomen:  soft, NTND, no guarding  Extremities: bilateral above the knee amputations   Lab Results:    Latest Ref Rng & Units 11/11/2022    5:48 AM 11/10/2022   12:42 AM 11/08/2022   11:50 AM  CBC  WBC 4.0 - 10.5 K/uL 7.3  6.4  6.6   Hemoglobin 12.0 - 15.0 g/dL 69.6  9.7  29.5   Hematocrit 36.0 - 46.0 % 33.1  31.4  33.2   Platelets 150 - 400 K/uL 120  123  116       Latest Ref Rng & Units 11/11/2022    5:48 AM 11/10/2022   12:42 AM 11/08/2022   11:50 AM  CMP  Glucose 70 - 99 mg/dL 99  284  132   BUN 6 - 20 mg/dL 28  45  57   Creatinine 0.44 - 1.00 mg/dL 4.40  1.02  7.25   Sodium 135 - 145 mmol/L 136  133  135   Potassium 3.5 -  5.1 mmol/L 4.4  4.6  5.2   Chloride 98 - 111 mmol/L 93  92  93   CO2 22 - 32 mmol/L 23  23  23    Calcium 8.9 - 10.3 mg/dL 9.8  9.4  9.0    Recent Labs    11/11/22 0709  GLUCAP 91    Micro Results: Recent Results (from the past 240 hour(s))  Resp panel by RT-PCR (RSV, Flu A&B, Covid) Anterior Nasal Swab     Status: None   Collection Time: 11/05/22 10:41 AM   Specimen: Anterior Nasal Swab  Result Value Ref Range Status   SARS Coronavirus 2 by RT PCR NEGATIVE NEGATIVE Final   Influenza A by PCR NEGATIVE NEGATIVE Final   Influenza B by PCR NEGATIVE NEGATIVE Final    Comment: (NOTE) The Xpert Xpress SARS-CoV-2/FLU/RSV plus assay is intended as an aid in the diagnosis of influenza from Nasopharyngeal swab specimens and should not be used as a sole  basis for treatment. Nasal washings and aspirates are unacceptable for Xpert Xpress SARS-CoV-2/FLU/RSV testing.  Fact Sheet for Patients: BloggerCourse.com  Fact Sheet for Healthcare Providers: SeriousBroker.it  This test is not yet approved or cleared by the [place] FDA and has been authorized for detection and/or diagnosis of SARS-CoV-2 by FDA under an Emergency Use Authorization (EUA). This EUA will remain in effect (meaning this test can be used) for the duration of the COVID-19 declaration under Section 564(b)(1) of the Act, 21 U.S.C. section 360bbb-3(b)(1), unless the authorization is terminated or revoked.     Resp Syncytial Virus by PCR NEGATIVE NEGATIVE Final    Comment: (NOTE) Fact Sheet for Patients: BloggerCourse.com  Fact Sheet for Healthcare Providers: SeriousBroker.it  This test is not yet approved or cleared by the [place] FDA and has been authorized for detection and/or diagnosis of SARS-CoV-2 by FDA under an Emergency Use Authorization (EUA). This EUA will remain in effect (meaning this test can be  used) for the duration of the COVID-19 declaration under Section 564(b)(1) of the Act, 21 U.S.C. section 360bbb-3(b)(1), unless the authorization is terminated or revoked.  Performed at [facility], [address], [place], [place] [address]   Culture, blood (Routine X 2) w Reflex to ID Panel     Status: None   Collection Time: 11/05/22  4:13 PM   Specimen: BLOOD  Result Value Ref Range Status   Specimen Description BLOOD LEFT UPPER HAND  Final   Special Requests   Final    BOTTLES DRAWN AEROBIC ONLY Blood Culture adequate volume   Culture   Final    NO GROWTH 5 DAYS Performed at [facility], [address], [place], [place] [address]    Report Status 11/10/2022 FINAL  Final  Culture, blood (Routine X 2) w Reflex to ID Panel     Status: None   Collection Time: 11/05/22  6:30 PM   Specimen: BLOOD LEFT HAND  Result Value Ref Range Status   Specimen Description BLOOD LEFT HAND UPPER LOWER  Final   Special Requests   Final    BOTTLES DRAWN AEROBIC AND ANAEROBIC Blood Culture results may not be optimal due to an excessive volume of blood received in culture bottles   Culture   Final    NO GROWTH 5 DAYS Performed at [facility], [address] [place], [place] [address]    Report Status 11/10/2022 FINAL  Final    Studies/Results: EP STUDY  Result Date: 11/11/2022 See surgical note for result.   Medications: I have reviewed the patient's current medications. 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 (11/10/22 2240)   PRN Meds:.cyclobenzaprine, diphenhydrAMINE, menthol-cetylpyridinium, oxyCODONE  Assessment/Plan: Principal  Problem:   Acute exacerbation of CHF (congestive heart failure) (HCC) Active Problems:   ESRD on dialysis (HCC)   Chronic respiratory failure with hypoxia (HCC)   Elevated troponin level not due to acute coronary syndrome   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HCC)   Acute on chronic combined systolic and diastolic CHF (congestive heart failure) (HCC)   Generalized weakness   Elevated troponin   Severe mitral regurgitation   Pressure injury of right ischium, stage 4 (HCC)   Left atrial mass  Izara Grahovac is a 52 y.o. female with a PMHx of HTN, ESRD on iHD, NSTEMI, obesity and DM who presents with generalized weakness and fatigue concerning for acute on chronic heart failure exacerbation found to have newly reduced EF (30-35%)   Active Problems   #Acute on chronic heart failure exacerbation with newly reduced EF (EF 30-35%) #Elevated troponins  H/o NSTEMI  #Echodense area c/f left atrial mass  TTE showed newly reduced EF at 30-35% with LVH, severe MR, mild-mod TR, and echodense area at left atria possible c/f mass. Went for TEE this AM. Mildly sedated following procedure. HDS.    -Cardiology consulted, appreciate recs, may consider adding a beta-blocker on non-dialysis days  -Cont bASA  -Daily BMP, CBC, Mg  -Continue nasal cannula, wean O2 as tolerated  -Follow-up TEE results  -Discuss reconsidering heparin for DVT prophylaxis. If declined, consider Eliquis  -Volume control with dialysis   #ESRD  L tunneled cath  #H/o chronic anemia #Hyperphosphatemia  MWF dialysis schedule. Access via L internal jugular tunneled dialysis catheter. Found to have low immunity to Hepatitis B.    -Nephrology consulted, appreciate recs, resume  dialysis on 8/3 -Consider HBV vaccine 2-dose series for dialysis  -Cont midodrine 10mg  during dialysis days  -Cont Velphoro 1000mg  TID   #Insomnia  #Fatigue  Multiple possible etiologies due to chronic conditions. Newly reduced EF likely contributor for recent changes.    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   -WOCN consulted, appreciate recs, cleaned with normal saline -Ischial dressing in place    #Chronic back pain Per chart review, she has been previously trialed on fentanyl patch.   -Cont fentanyl patch 25ug every 72 hrs  -Flexeril PRN  -Oxycodone PRN    #Constipation, improving:  BM following suppository this AM. Continue bowel regiment   -Senna 2 tabs daily   -Colace daily   -Consider suppository   Chronic Problems   #Hypothyroidism: Cont home levothyroxine 100 mcg p.o. daily   #T2DM: A1C 5.1 on June 2024. Diet controlled   #HLD: LDL 23 in 08/2021. Cont home atorvastatin 10 mg daily    #GERD: Cont home Protonix 40mg  daily    #Phantom leg pain: Takes Gabapentin and Lyrica at home. Will continue home gabapentin 200mg  daily and Lyrica 50mg  daily    #HTN: Previously prescribed ACEi. Not currently taking any home hypertensives. BP stable    #Chronic respiratory failure: uses 3-4L oxygen at home as needed. Currently on 4L nasal cannula. Wean O2 as tolerated    #Peripheral artery disease: s/p bilateral AKA. Managed with ASA and atorvastatin as listed above      DVT prophx: declined DVT prophylaxis per patient   Diet: Renal Bowel: PRN Code: Full    Prior to Admission Living Arrangement: [facility] Anticipated Discharge Location: [facility] - [facility]  Barriers to Discharge:  Medical Workup   Dispo: Admit patient to inpatient with expected length of stay more than 2 midnights.  This is a Psychologist, occupational Note.  The care of the  patient was discussed with Dr. Thomasene Ripple and the assessment and plan formulated with their assistance.  Please see their attached note for official documentation of the daily encounter.   LOS: 2 days   Loyal Buba 11/11/2022, 11:47 AM

## 2022-11-11 NOTE — CV Procedure (Signed)
     PROCEDURE NOTE:  Procedure:  Transesophageal echocardiogram Operator:  Armanda Magic, MD Indications:  Possible Left Atrial Mass/Mitral Regurgitation Complications: None  During this procedure the patient is administered a total of Propofol 283 mg  and Lidocaine 60mg  to achieve and maintain moderate conscious sedation.  The patient's heart rate, blood pressure, and oxygen saturation are monitored continuously during the procedure by anesthesia.   Results: Normal LV size moderately reduced LV function EF 30-35% Normal RV size and function Moderately dilated RA.  There is a dialysis catheter in the SVC extending into the right atrium.  This catheter intermittently abuts the right atrial wall.  There is severe calcification of the RA wall that has extensive extension in the the RA cavity encompassing a large volume of the atrial cavity.   Moderate to severely dilated LA.  No evidence of LA or LAA thrombus. Normal TV with moderate TR. Normal PV Degenerative MV with severe mitral annular calcification, thickened MV leaflets and calcified subvalvular MV apparatus. There is severe mitral regurgitation with flow reversal in the left upper pulmonary vein by colorflow doppler and CW.  This is functional related to LV dysfunction with severe tethering of the posterior mitral valve leaflet.    Trileaflet AV with moderate AV sclerosis and calcification and mild AS with AVA 1.53cm2 by planimetry. Normal interatrial septum with no evidence of shunt by colorflow dopper  Moderate plaque in the ascending and descending aorta and aortic root  The patient tolerated the procedure well and was transferred back to their room in stable condition. Findings were discussed with Dr. Gala Romney and Dr. Bjorn Pippin.  Signed: Armanda Magic, MD [facility]

## 2022-11-11 NOTE — Anesthesia Preprocedure Evaluation (Signed)
Anesthesia Evaluation  Patient identified by MRN, date of birth, ID band Patient awake    Reviewed: Allergy & Precautions, NPO status , Patient's Chart, lab work & pertinent test results  History of Anesthesia Complications Negative for: history of anesthetic complications  Airway Mallampati: II  TM Distance: >3 FB Neck ROM: Full    Dental  (+) Dental Advisory Given, Missing,    Pulmonary shortness of breath and Long-Term Oxygen Therapy, neg sleep apnea, neg COPD, neg recent URI   breath sounds clear to auscultation       Cardiovascular hypertension, (-) angina + CAD, + Past MI, + Peripheral Vascular Disease and +CHF  (-) Cardiac Stents and (-) CABG  Rhythm:Regular   1. Left ventricular ejection fraction, by estimation, is 30 to 35%. The  left ventricle has moderately decreased function. The left ventricle  demonstrates global hypokinesis. The left ventricular internal cavity size  was moderately dilated. There is  moderate concentric left ventricular hypertrophy. Left ventricular  diastolic parameters are consistent with Grade II diastolic dysfunction  (pseudonormalization).   2. Right ventricular systolic function is low normal. The right  ventricular size is not well visualized. There is moderately elevated  pulmonary artery systolic pressure.   3. Echodense area visualized in several views but not well seen. Cannot  exclude mass. Left atrial size was moderately dilated.   4. Right atrial size was moderately dilated.   5. The mitral valve is degenerative. Severe mitral valve regurgitation.  Severe mitral annular calcification.   6. Tricuspid valve regurgitation is mild to moderate.   7. The aortic valve is grossly normal. There is moderate calcification of  the aortic valve. There is moderate thickening of the aortic valve. Aortic  valve regurgitation is trivial. Mild aortic valve stenosis. Aortic valve  mean gradient  measures 11.5     Neuro/Psych negative neurological ROS     GI/Hepatic Neg liver ROS,GERD  Medicated and Controlled,,  Endo/Other  diabetesHypothyroidism    Renal/GU ESRF and DialysisRenal diseaseLab Results      Component                Value               Date                      NA                       136                 11/11/2022                K                        4.4                 11/11/2022                CO2                      23                  11/11/2022                GLUCOSE                  99  11/11/2022                BUN                      28 (H)              11/11/2022                CREATININE               4.89 (H)            11/11/2022                CALCIUM                  9.8                 11/11/2022                GFRNONAA                 10 (L)              11/11/2022            Last hd 8/5     Musculoskeletal  (+) Arthritis ,    Abdominal   Peds  Hematology  (+) Blood dyscrasia, anemia Lab Results      Component                Value               Date                      WBC                      7.3                 11/11/2022                HGB                      10.4 (L)            11/11/2022                HCT                      33.1 (L)            11/11/2022                MCV                      94.0                11/11/2022                PLT                      120 (L)             11/11/2022              Anesthesia Other Findings   Reproductive/Obstetrics                              Anesthesia Physical Anesthesia Plan  ASA: 4  Anesthesia Plan:  MAC   Post-op Pain Management: Minimal or no pain anticipated   Induction: Intravenous  PONV Risk Score and Plan: 2 and Propofol infusion and Treatment may vary due to age or medical condition  Airway Management Planned: Nasal Cannula, Natural Airway and Simple Face Mask  Additional Equipment: None  Intra-op Plan:    Post-operative Plan:   Informed Consent: I have reviewed the patients History and Physical, chart, labs and discussed the procedure including the risks, benefits and alternatives for the proposed anesthesia with the patient or authorized representative who has indicated his/her understanding and acceptance.     Dental advisory given  Plan Discussed with: CRNA  Anesthesia Plan Comments:          Anesthesia Quick Evaluation

## 2022-11-11 NOTE — Progress Notes (Addendum)
   11/11/22 0115  Vitals  Temp 97.6 F (36.4 C)  BP Location Right Arm  BP Method Automatic  Patient Position (if appropriate) Lying  Pulse Rate Source Monitor  Oxygen Therapy  O2 Device Nasal Cannula  During Treatment Monitoring  Intra-Hemodialysis Comments Tx completed  Dialysis Fluid Bolus  (blood return)  Post Treatment  Dialyzer Clearance Lightly streaked  Duration of HD Treatment -hour(s) 4 hour(s)  Liters Processed 82.2  Fluid Removed (mL) 3200 mL  Tolerated HD Treatment Yes  Hemodialysis Catheter Left Internal jugular Double lumen Permanent (Tunneled)  Placement Date/Time: 03/07/22 1105   Serial / Lot #: [id number]  Expiration Date: 09/09/26  Time Out: Correct patient;Correct site;Correct procedure  Maximum sterile barrier precautions: Hand hygiene;Cap;Mask;Sterile gown;Sterile gloves;Large sterile ...  Site Condition No complications  Blue Lumen Status Heparin locked  Red Lumen Status Heparin locked  Catheter fill solution Heparin 1000 units/ml  Catheter fill volume (Arterial) 2.1 cc  Catheter fill volume (Venous) 2.1  Dressing Type Transparent  Dressing Status Clean, Dry, Intact  Dressing Change Due 11/13/22  Post treatment catheter status Capped and Clamped   Post HD B/P 112/72 hr 92 temp 97.6. Report to Janalyn Shy RN

## 2022-11-11 NOTE — Progress Notes (Addendum)
Rounding Note    Patient Name: Courtney Gray Date of Encounter: 11/11/2022  [facility] Cardiologist: None   Subjective   Denies shortness of breath or chest pain  Inpatient Medications    Scheduled Meds:  aspirin EC  81 mg Oral Daily   atorvastatin  10 mg Oral Daily   dicyclomine  10 mg Oral TID   docusate sodium  50 mg Oral Daily   fentaNYL  1 patch Transdermal Q72H   gabapentin  200 mg Oral Daily   heparin  5,000 Units Subcutaneous Q8H   levothyroxine  100 mcg Oral Q0600   melatonin  5 mg Oral QHS   midodrine  10 mg Oral Q M,W,F-2000   pantoprazole  40 mg Oral Daily   polyethylene glycol  17 g Oral Daily   senna  2 tablet Oral Daily   sucroferric oxyhydroxide  1,000 mg Oral TID WC   Continuous Infusions:  sodium thiosulfate 25 g in sodium chloride 0.9 % 200 mL 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 (11/10/22 2240)   PRN Meds: cyclobenzaprine, diphenhydrAMINE, menthol-cetylpyridinium, oxyCODONE   Vital Signs    Vitals:   11/11/22 0850 11/11/22 0900 11/11/22 0910 11/11/22 0938  BP: (!) 119/47 (!) 119/34 (!) 121/34   Pulse: 85 87 87 85  Resp: 15 (!) 28 (!) 22   Temp:    98.2 F (36.8 C)  TempSrc:      SpO2: 93% 93% 94%   Weight:      Height:        Intake/Output Summary (Last 24 hours) at 11/11/2022 0941 Last data filed at 11/11/2022 8119 Gross per 24 hour  Intake 521.82 ml  Output 3200 ml  Net -2678.18 ml      11/11/2022    7:08 AM 11/08/2022    4:20 PM 11/08/2022   11:27 AM  Last 3 Weights  Weight (lbs) 260 lb -- --  Weight (kg) 117.935 kg -- --      Telemetry    Normal sinus rhythm  - Personally Reviewed  ECG    No new ECG tracing today. - Personally Reviewed  Physical Exam   GEN: Morbidly obese African-American. No acute distress.  On 3L of O2 via nasal cannula. Neck: JVD difficult to assess due to body habitus. Cardiac: RRR. II/VI systolic murmur. No rubs or gallops. Respiratory: Clear to auscultation bilaterally. No wheezes,  rhonchi, or rales. GI: Soft, non-distended, and non-tender. MS: S/p bilateral above knee amputations. Neuro:  No focal deficits. Psych: Normal affect. Responds appropriately.  Labs    High Sensitivity Troponin:   Recent Labs  Lab 11/05/22 1041 11/05/22 1241  TROPONINIHS 242* 239*     Chemistry Recent Labs  Lab 11/05/22 1041 11/05/22 1155 11/07/22 0703 11/08/22 1150 11/10/22 0042 11/11/22 0548  NA 137   < > 134* 135 133* 136  K 5.3*   < > 4.7 5.2* 4.6 4.4  CL 94*   < > 94* 93* 92* 93*  CO2 23   < > 26 23 23 23   GLUCOSE 87   < > 106* 100* 126* 99  BUN 55*   < > 37* 57* 45* 28*  CREATININE 9.93*   < > 7.45* 9.41* 7.90* 4.89*  CALCIUM 9.4   < > 9.7 9.0 9.4 9.8  MG  --   --   --   --  1.8 1.7  PROT 8.1  --   --   --   --   --  ALBUMIN 3.7  --  3.6  --   --   --   AST 23  --   --   --   --   --   ALT 21  --   --   --   --   --   ALKPHOS 80  --   --   --   --   --   BILITOT 0.8  --   --   --   --   --   GFRNONAA 4*   < > 6* 5* 6* 10*  ANIONGAP 20*   < > 14 19* 18* 20*   < > = values in this interval not displayed.    Lipids No results for input(s): "CHOL", "TRIG", "HDL", "LABVLDL", "LDLCALC", "CHOLHDL" in the last 168 hours.  Hematology Recent Labs  Lab 11/08/22 1150 11/10/22 0042 11/11/22 0548  WBC 6.6 6.4 7.3  RBC 3.46* 3.33* 3.52*  HGB 10.2* 9.7* 10.4*  HCT 33.2* 31.4* 33.1*  MCV 96.0 94.3 94.0  MCH 29.5 29.1 29.5  MCHC 30.7 30.9 31.4  RDW 15.6* 16.2* 16.6*  PLT 116* 123* 120*   Thyroid  Recent Labs  Lab 11/06/22 0310  TSH 4.008    BNP Recent Labs  Lab 11/05/22 1041  BNP 1,832.9*    DDimer No results for input(s): "DDIMER" in the last 168 hours.   Radiology    EP STUDY  Result Date: 11/11/2022 See surgical note for result.   Cardiac Studies   TTE 09/10/2021: Impressions:  1. Poor quality study. Globally LV function appears normal.   2. Left ventricular ejection fraction, by estimation, is 50 to 55%. The  left ventricle has low normal  function. Left ventricular endocardial  border not optimally defined to evaluate regional wall motion. There is  moderate concentric left ventricular  hypertrophy. Left ventricular diastolic parameters are consistent with  Grade II diastolic dysfunction (pseudonormalization).   3. Right ventricular systolic function was not well visualized. The right  ventricular size is not well visualized. There is mildly elevated  pulmonary artery systolic pressure.   4. Left atrial size was mild to moderately dilated.   5. The mitral valve is degenerative. Trivial mitral valve regurgitation.  No evidence of mitral stenosis.   6. Tricuspid valve regurgitation is mild to moderate.   7. The aortic valve is tricuspid. Aortic valve regurgitation is not  visualized. Mild aortic valve stenosis.   8. The inferior vena cava is normal in size with <50% respiratory  variability, suggesting right atrial pressure of 8 mmHg.  _______________     TEE 09/27/2021: Impression:  1. Dialysis catheter noted in SVC; calcified right atrial mass noted  (etiology unclear; possible calcified thrombus); if pt can tolerate,  cardiac MRI could provide more definitive evaluation.   2. Left ventricular ejection fraction, by estimation, is 65 to 70%. The  left ventricle has normal function. The left ventricle has no regional  wall motion abnormalities.   3. Right ventricular systolic function is normal. The right ventricular  size is normal.   4. Left atrial size was mildly dilated. No left atrial/left atrial  appendage thrombus was detected.   5. Right atrial size was mildly dilated.   6. The mitral valve is normal in structure. Mild mitral valve  regurgitation. Moderate mitral annular calcification.   7. Tricuspid valve regurgitation is moderate.   8. The aortic valve is tricuspid. Aortic valve regurgitation is not  visualized. Aortic valve sclerosis is present, with no  evidence of aortic  valve stenosis.   9. There is  mild (Grade II) plaque involving the descending aorta.  10. Evidence of atrial level shunting detected by color flow Doppler.  Agitated saline contrast bubble study was positive with shunting observed  within 3-6 cardiac cycles suggestive of interatrial shunt.  _______________  TTE 11/06/2022: 1. Left ventricular ejection fraction, by estimation, is 30 to 35%. The  left ventricle has moderately decreased function. The left ventricle  demonstrates global hypokinesis. The left ventricular internal cavity size  was moderately dilated. There is  moderate concentric left ventricular hypertrophy. Left ventricular  diastolic parameters are consistent with Grade II diastolic dysfunction  (pseudonormalization).   2. Right ventricular systolic function is low normal. The right  ventricular size is not well visualized. There is moderately elevated  pulmonary artery systolic pressure.   3. Echodense area visualized in several views but not well seen. Cannot  exclude mass. Left atrial size was moderately dilated.   4. Right atrial size was moderately dilated.   5. The mitral valve is degenerative. Severe mitral valve regurgitation.  Severe mitral annular calcification.   6. Tricuspid valve regurgitation is mild to moderate.   7. The aortic valve is grossly normal. There is moderate calcification of  the aortic valve. There is moderate thickening of the aortic valve. Aortic  valve regurgitation is trivial. Mild aortic valve stenosis. Aortic valve  mean gradient measures 11.5  mmHg.   8. The inferior vena cava is dilated in size with >50% respiratory  variability, suggesting right atrial pressure of 8 mmHg    Patient Profile     52 y.o. 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 Cardiology was consulted for elevated troponin.   Assessment & Plan    Elevated Troponin Patient presented with generalized weakness and was found to have an elevated troponin. High-sensitivity troponin 242 >> 239. No acute ischemic EKG changes. Echo showed a drop in LVEF to 30-35% with global hypokinesis. - No chest pain. - Troponin elevation may be secondary to demand ischemia. However, she does have multiple CV risk factors including PAD. Given drop in EF,  plan is to proceed with [facility] with Dr. Gala Romney. Risks and benefits of cardiac catheterization have been discussed with the patient.  These include bleeding, infection, kidney damage, stroke, heart attack, death.  The patient understands these risks and is willing to proceed.  ADDENDUM:  TEE shows calcified mass in right atrium, severe MR.  Given mass in RA, RHC would be contraindicated.  D/w Bensimhon, since she is not a candidate for intervention on her mitral valve, and as RHC is contraindicated, would hold off on LHC unless having chest pain.  I discussed this with patient, she became upset when I talked about that we could still do a LHC, as she does not want this done.  No further interventions planned.  Medical management recommended.    Acute on Chronic Combined CHF with Newly Reduced EF Last Echo in 09/2021 showed LVEF of 50-55% with moderate LVH and grade 2 diastolic dysfunction. BNP elevated at 1,832 this admission. Chest x-ray showed enlarged cardiopericardial silhouette with vascular congestion. Echo showed LVEF of 30-35% with global hypokinesis, moderate LVH,  and grade 2 diastolic dysfunction as well as low normal RV function. - Volume status difficult to assess due to morbid obesity but does not appear significantly volume overloaded. - Volume status managed via dialysis. - GDMT limited due to  ESRD and hypotension on dialysis days   Possible Calcified Thrombus on Dialysis Catheter Echodense Area in Left Atrium TEE in 09/2021 showed a calcified right atrial mass. Etiology was unclear but possible calcified thrombus. 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 Dialysis catheterization was replaced at that time. TTE this admission did not reveal a mass in the right atrium but did show an echodense area in the left atrium (mass cannot be excluded. Blood cultures this admission negative so far. - TEE today   Severe Mitral Regurgitation Noted on Echo this admission. - Plan is for [facility] and TEE as above.   Hypertension BP soft at times but stable. - Continue Midodrine on dialysis days.   Hyperlipidemia LDL 23 in 08/2021. - Continue home Lipitor 10mg  daily.   ESRD On hemodialysis M/W/F. - Management per Nephrology.     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

## 2022-11-11 NOTE — Plan of Care (Signed)
  Problem: Education: Goal: Knowledge of General Education information will improve Description Including pain rating scale, medication(s)/side effects and non-pharmacologic comfort measures Outcome: Progressing   

## 2022-11-11 NOTE — Anesthesia Postprocedure Evaluation (Signed)
Anesthesia Post Note  Patient: Courtney Gray  Procedure(s) Performed: TRANSESOPHAGEAL ECHOCARDIOGRAM     Patient location during evaluation: Cath Lab Anesthesia Type: MAC Level of consciousness: awake and alert Pain management: pain level controlled Vital Signs Assessment: post-procedure vital signs reviewed and stable Respiratory status: spontaneous breathing, nonlabored ventilation and respiratory function stable Cardiovascular status: stable and blood pressure returned to baseline Postop Assessment: no apparent nausea or vomiting Anesthetic complications: no   No notable events documented.  Last Vitals:  Vitals:   11/11/22 0910 11/11/22 0938  BP: (!) 121/34   Pulse: 87 85  Resp: (!) 22   Temp:  36.8 C  SpO2: 94%     Last Pain:  Vitals:   11/11/22 0841  TempSrc: Temporal  PainSc: 0-No pain                  

## 2022-11-11 NOTE — Anesthesia Procedure Notes (Signed)
Procedure Name: MAC Date/Time: 11/11/2022 8:17 AM  Performed by: Randon Goldsmith, CRNAPre-anesthesia Checklist: Patient identified, Emergency Drugs available, Suction available and Patient being monitored Patient Re-evaluated:Patient Re-evaluated prior to induction Oxygen Delivery Method: Simple face mask Preoxygenation: Pre-oxygenation with 100% oxygen

## 2022-11-11 NOTE — Transfer of Care (Signed)
Immediate Anesthesia Transfer of Care Note  Patient: Courtney Gray  Procedure(s) Performed: TRANSESOPHAGEAL ECHOCARDIOGRAM  Patient Location: Cath Lab  Anesthesia Type:MAC  Level of Consciousness: alert  and drowsy  Airway & Oxygen Therapy: Patient Spontanous Breathing and Patient connected to nasal cannula oxygen  Post-op Assessment: Report given to RN and Post -op Vital signs reviewed and stable  Post vital signs: Reviewed and stable  Last Vitals:  Vitals Value Taken Time  BP 118/59   Temp    Pulse 85 11/11/22 0842  Resp 26 11/11/22 0842  SpO2 100 % 11/11/22 0842  Vitals shown include unfiled device data.  Last Pain:  Vitals:   11/11/22 0708  TempSrc: Temporal  PainSc: 0-No pain      Patients Stated Pain Goal: 0 (11/09/22 2213)  Complications: No notable events documented.

## 2022-11-11 NOTE — Progress Notes (Signed)
Subjective:   Seen in room. Had HD last night w/ 3.2 L off.   Objective Vital signs in last 24 hours: Vitals:   11/11/22 0900 11/11/22 0910 11/11/22 0938 11/11/22 1615  BP: (!) 119/34 (!) 121/34  107/74  Pulse: 87 87 85 87  Resp: (!) 28 (!) 22  19  Temp:   98.2 F (36.8 C) 98.3 F (36.8 C)  TempSrc:    Oral  SpO2: 93% 94%  100%  Weight:      Height:       Weight change:   Physical Exam: General: Alert  Obese, chronically ill appearing Female, NAD  Heart: RRR, 1/6/sem, no rg Lungs: CTA  but distant sounds  2/2 body habitus /  2 l [place] O2, nonlabored  Abdomen: Morbid obese, NABS, soft Extremities: Bilat AKA, no stump edema,  Dialysis Access: L  internal jugular TDC       OP HD: [facility] MWF   . 4h  119kg  2/2 bath  Heparin 5000  [facility] TDC  Mircera 50 mcg  (last given 11/03/22) Venofer 50 mg  w wkly hd   Hectorol 4 mcg   Sodium thiosulfate 12.5g post HD   CXR 7/31 - IMPRESSION: Enlarged cardiopericardial silhouette with vascular congestion. Stable large bore left IJ catheter    Assessment/Plan Weakness=  now thought to be cardiac related --> EF 30-35%, severe MVR.  Cardiology following and w/u underway - likely TEE and LHC this week.   ESRD -  HD MWF. Declined HD Friday so had HD Sat instead, then HD yesterday. Next HD Wednesday.  Chronic HF p EF = EF 30-35% cardiology following.   With severe MVR.  Hypertension/volume  - BP's good, wt's down from 123 --> 118 here. Dry wt was 119kg.  Euvolemic on exam. Initial CXR w/o edema.  Anemia  -  HGB 10.6< 12.2  no esa  needs , hold iron  for now Metabolic bone disease -   Ca 9.9  will hold vit d  for now  / phos 9.9   velphoro binders ( phos chronically high  as op also )   HO Calciphylaxis = Abd and gluteal sites  stable , Sodium thiosulfate on HD  ( last PTH 1053 on 10/22/22  )  Chronic Back pain = meds per admit   Courtney Moselle  MD  [facility] 11/11/2022, 5:02 PM  Recent Labs  Lab 11/05/22 1041 11/05/22 1155 11/06/22 0310 11/07/22 0703  11/08/22 1150 11/10/22 0042 11/11/22 0548  HGB 11.2*   < > 10.6* 10.7*   < > 9.7* 10.4*  ALBUMIN 3.7  --   --  3.6  --   --   --   CALCIUM 9.4  --  9.3 9.7   < > 9.4 9.8  PHOS  --   --  9.0* 7.2*  --   --   --   CREATININE 9.93*  --  11.24* 7.45*   < > 7.90* 4.89*  K 5.3*   < > 5.9* 4.7   < > 4.6 4.4   < > = values in this interval not displayed.    Inpatient medications:  aspirin EC  81 mg Oral Daily   atorvastatin  10 mg Oral Daily   dicyclomine  10 mg Oral TID   docusate sodium  50 mg Oral Daily   fentaNYL  1 patch Transdermal Q72H   gabapentin  200 mg Oral Daily   heparin  5,000 Units Subcutaneous Q8H  levothyroxine  100 mcg Oral Q0600   melatonin  5 mg Oral QHS   midodrine  10 mg Oral Q M,W,F-2000   pantoprazole  40 mg Oral Daily   polyethylene glycol  17 g Oral Daily   senna  2 tablet Oral Daily   sucroferric oxyhydroxide  1,000 mg Oral TID WC    sodium thiosulfate 25 g in sodium chloride 0.9 % 200 mL 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 (11/10/22 2240)   cyclobenzaprine, diphenhydrAMINE, menthol-cetylpyridinium, oxyCODONE

## 2022-11-11 NOTE — Interval H&P Note (Signed)
History and Physical Interval Note:  11/11/2022 7:55 AM  Courtney Gray  has presented today for surgery, with the diagnosis of MITROVALVE REGURGIATATION.  The various methods of treatment have been discussed with the patient and family. After consideration of risks, benefits and other options for treatment, the patient has consented to  Procedure(s): TRANSESOPHAGEAL ECHOCARDIOGRAM (N/A) as a surgical intervention.  The patient's history has been reviewed, patient examined, no change in status, stable for surgery.  I have reviewed the patient's chart and labs.  Questions were answered to the patient's satisfaction.     Armanda Magic

## 2022-11-12 ENCOUNTER — Encounter (HOSPITAL_COMMUNITY): Payer: Self-pay | Admitting: Cardiology

## 2022-11-12 DIAGNOSIS — I5189 Other ill-defined heart diseases: Secondary | ICD-10-CM | POA: Diagnosis not present

## 2022-11-12 DIAGNOSIS — J9611 Chronic respiratory failure with hypoxia: Secondary | ICD-10-CM | POA: Diagnosis not present

## 2022-11-12 DIAGNOSIS — I340 Nonrheumatic mitral (valve) insufficiency: Secondary | ICD-10-CM | POA: Diagnosis not present

## 2022-11-12 DIAGNOSIS — I5043 Acute on chronic combined systolic (congestive) and diastolic (congestive) heart failure: Secondary | ICD-10-CM | POA: Diagnosis not present

## 2022-11-12 LAB — RENAL FUNCTION PANEL
Albumin: 3.3 g/dL — ABNORMAL LOW (ref 3.5–5.0)
Anion gap: 19 — ABNORMAL HIGH (ref 5–15)
BUN: 43 mg/dL — ABNORMAL HIGH (ref 6–20)
CO2: 21 mmol/L — ABNORMAL LOW (ref 22–32)
Calcium: 9 mg/dL (ref 8.9–10.3)
Chloride: 95 mmol/L — ABNORMAL LOW (ref 98–111)
Creatinine, Ser: 5.96 mg/dL — ABNORMAL HIGH (ref 0.44–1.00)
GFR, Estimated: 8 mL/min — ABNORMAL LOW (ref >60)
Glucose, Bld: 110 mg/dL — ABNORMAL HIGH (ref 70–99)
Phosphorus: 5.8 mg/dL — ABNORMAL HIGH (ref 2.5–4.6)
Potassium: 4.5 mmol/L (ref 3.5–5.1)
Sodium: 135 mmol/L (ref 135–145)

## 2022-11-12 LAB — CBC
HCT: 31.8 % — ABNORMAL LOW (ref 36.0–46.0)
Hemoglobin: 9.8 g/dL — ABNORMAL LOW (ref 12.0–15.0)
MCH: 30.3 pg (ref 26.0–34.0)
MCHC: 30.8 g/dL (ref 30.0–36.0)
MCV: 98.5 fL (ref 80.0–100.0)
Platelets: 118 10*3/uL — ABNORMAL LOW (ref 150–400)
RBC: 3.23 MIL/uL — ABNORMAL LOW (ref 3.87–5.11)
RDW: 16.6 % — ABNORMAL HIGH (ref 11.5–15.5)
WBC: 5.6 10*3/uL (ref 4.0–10.5)
nRBC: 0 % (ref 0.0–0.2)

## 2022-11-12 MED ORDER — POLYETHYLENE GLYCOL 3350 17 G PO PACK
17.0000 g | PACK | Freq: Once | ORAL | Status: AC
  Administered 2022-11-12: 17 g via ORAL
  Filled 2022-11-12: qty 1

## 2022-11-12 MED ORDER — MIDODRINE HCL 5 MG PO TABS
ORAL_TABLET | ORAL | Status: AC
  Filled 2022-11-12: qty 2

## 2022-11-12 MED ORDER — HEPARIN SODIUM (PORCINE) 1000 UNIT/ML IJ SOLN
INTRAMUSCULAR | Status: AC
  Filled 2022-11-12: qty 5

## 2022-11-12 MED ORDER — HEPARIN SODIUM (PORCINE) 1000 UNIT/ML DIALYSIS
2000.0000 [IU] | INTRAMUSCULAR | Status: DC | PRN
  Filled 2022-11-12: qty 2

## 2022-11-12 MED ORDER — DOCUSATE SODIUM 100 MG PO CAPS
100.0000 mg | ORAL_CAPSULE | Freq: Every day | ORAL | Status: DC
  Administered 2022-11-12 – 2022-11-13 (×2): 100 mg via ORAL
  Filled 2022-11-12 (×2): qty 1

## 2022-11-12 MED ORDER — MIDODRINE HCL 5 MG PO TABS
10.0000 mg | ORAL_TABLET | ORAL | Status: AC | PRN
  Administered 2022-11-12: 10 mg via ORAL

## 2022-11-12 MED ORDER — HEPARIN SODIUM (PORCINE) 1000 UNIT/ML DIALYSIS
3000.0000 [IU] | Freq: Once | INTRAMUSCULAR | Status: AC
  Administered 2022-11-12: 3000 [IU] via INTRAVENOUS_CENTRAL
  Filled 2022-11-12: qty 3

## 2022-11-12 NOTE — Discharge Instructions (Addendum)
Hi Mrs. Courtney Gray,  You were admitted to [facility] for generalized weakness and fatigue found to have an acute heart failure exacerbation. You were treated with hemodialysis for volume management.  You are to continue following your regular Monday, Wednesday, Friday dialysis schedule and continue your home medications.   Please attend your follow-up appointments.  -You have an appointment with Cardiology on 11/28/22 at 8:25AM  -Please schedule a follow-up appointment with primary care physician in the next 2-4 weeks [phone])  If you develop worsening shortness of breath, chest pain, or a racing heart do not hesitate to go to the hospital.   If you have any questions or concerns, call our clinic at [phone] or after hours call [phone] and ask for the internal medicine resident on call.

## 2022-11-12 NOTE — Progress Notes (Signed)
Follow up with cardiology arranged 11/28/22.

## 2022-11-12 NOTE — Progress Notes (Signed)
[facility] Progress Note   Subjective:    Seen and examined on HD. Noted UFG 2.5L. Bps are now soft and she appears euvolemic on exam. UFG lowered to 1-2L as tolerated. Keep SBP > 90. Patient denies SOB, CP, and N/V. TEE completed yesterday.  Objective Vitals:   11/12/22 0906 11/12/22 0932 11/12/22 1000 11/12/22 1030  BP: (!) 102/91 (!) 115/41 (!) 117/52 (!) 99/51  Pulse: 74 76 76 79  Resp: 18 20  17   Temp:      TempSrc:      SpO2: 100% 100% 100% 97%  Weight:      Height:       Physical Exam General: Awake, Alert  Obese, chronically ill appearing Female, NAD  Heart: RRR, 1/6/sem, no rg Lungs: CTA  but distant sounds  2/2 body habitus /  2 l [place] O2, nonlabored  Abdomen: Morbid obese, NABS, soft Extremities: Bilat AKA, no stump edema,  Dialysis Access: L  internal jugular [facility]     Filed Weights   11/08/22 0435 11/11/22 0708 11/12/22 0457  Weight: 129.7 kg 117.9 kg 117.9 kg   No intake or output data in the 24 hours ending 11/12/22 1106  Additional Objective Labs: Basic Metabolic Panel: Recent Labs  Lab 11/06/22 0310 11/07/22 0703 11/08/22 1150 11/10/22 0042 11/11/22 0548 11/12/22 0940  NA 133* 134*   < > 133* 136 135  K 5.9* 4.7   < > 4.6 4.4 4.5  CL 93* 94*   < > 92* 93* 95*  CO2 22 26   < > 23 23 21*  GLUCOSE 107* 106*   < > 126* 99 110*  BUN 66* 37*   < > 45* 28* 43*  CREATININE 11.24* 7.45*   < > 7.90* 4.89* 5.96*  CALCIUM 9.3 9.7   < > 9.4 9.8 9.0  PHOS 9.0* 7.2*  --   --   --  5.8*   < > = values in this interval not displayed.   Liver Function Tests: Recent Labs  Lab 11/07/22 0703 11/12/22 0940  ALBUMIN 3.6 3.3*   No results for input(s): "LIPASE", "AMYLASE" in the last 168 hours. CBC: Recent Labs  Lab 11/07/22 0703 11/08/22 1150 11/10/22 0042 11/11/22 0548 11/12/22 0641  WBC 5.7 6.6 6.4 7.3 5.6  NEUTROABS 3.8  --   --   --   --   HGB 10.7* 10.2* 9.7* 10.4* 9.8*  HCT 35.1* 33.2* 31.4* 33.1* 31.8*  MCV 96.2 96.0 94.3 94.0  98.5  PLT 99* 116* 123* 120* 118*   Blood Culture    Component Value Date/Time   SDES BLOOD LEFT HAND UPPER LOWER 11/05/2022 1830   SPECREQUEST  11/05/2022 1830    BOTTLES DRAWN AEROBIC AND ANAEROBIC Blood Culture results may not be optimal due to an excessive volume of blood received in culture bottles   CULT  11/05/2022 1830    NO GROWTH 5 DAYS Performed at [facility], [address], [place], [place] [address]    REPTSTATUS 11/10/2022 FINAL 11/05/2022 1830    Cardiac Enzymes: No results for input(s): "CKTOTAL", "CKMB", "CKMBINDEX", "TROPONINI" in the last 168 hours. CBG: Recent Labs  Lab 11/05/22 1232 11/11/22 0709  GLUCAP 82 91   Iron Studies: No results for input(s): "IRON", "TIBC", "TRANSFERRIN", "FERRITIN" in the last 72 hours. Lab Results  Component Value Date   INR 1.1 09/09/2022   INR 1.3 (H) 05/25/2022   INR 1.3 (H) 10/09/2021   Studies/Results: ECHO TEE  Result Date: 11/11/2022    TRANSESOPHOGEAL ECHO REPORT   Patient Name:   Courtney Gray Date of Exam: 11/11/2022 Medical Rec #:  [id number]       Height:       65.0 in Accession #:    [id number]      Weight:       260.0 lb Date of Birth:  02-13-1971        BSA:          2.211 m Patient Age:    52 years        BP:           128/116 mmHg Patient Gender: F               HR:           92 bpm. Exam Location:  Inpatient Procedure: Transesophageal Echo, 3D Echo, Color Doppler and Cardiac Doppler Indications:     Mitral Regurgitation i34.0  History:         Patient has prior history of Echocardiogram examinations, most                  recent 11/06/2022. CHF, CAD; Risk Factors:Hypertension, Diabetes                  and Dyslipidemia.  Sonographer:     Irving Burton Senior RDCS Referring Phys:  [phone] TRACI R TURNER Diagnosing Phys: Armanda Magic MD PROCEDURE: After discussion of the risks and benefits of a TEE, an informed consent was obtained from the patient. The transesophogeal probe was passed without difficulty through the  esophogus of the patient. Imaged were obtained with the patient in a supine position. Sedation performed by different physician. The patient was monitored while under deep sedation. Anesthestetic sedation was provided intravenously by Anesthesiology: 283mg  of Propofol, 60mg  of Lidocaine. Image quality was good. The patient's vital signs; including heart rate, blood pressure, and oxygen saturation; remained stable throughout the procedure. The patient developed no complications during the procedure.  IMPRESSIONS  1. Left ventricular ejection fraction, by estimation, is 30 to 35%. The left ventricle has moderately decreased function. The left ventricle demonstrates global hypokinesis.  2. Right ventricular systolic function is normal. The right ventricular size is normal. A HD catheter present in SVC is visualized.  3. There is significant calcification of the subvalvular apparatus.. The mitral valve is degenerative. Severe mitral valve regurgitation. No evidence of mitral stenosis. Moderate mitral annular calcification. A pattern of systolic flow reversal, suggestive of severe mitral regurgitation is recorded from the left upper pulmonary vein.     MR radius 1.3cm. MR EROA 0.68cm2. MR Volume 106cc. There appears to be severe tethering of the posterior mitral valve leaflet related to LV dysfunction.  4. Tricuspid valve regurgitation is moderate.  5. AVA 1.53cm2 by planimetry consistent with mild aortic stenosis.. The aortic valve is tricuspid. There is moderate calcification of the aortic valve. Aortic valve regurgitation is not visualized. Mild aortic valve stenosis.  6. No evidence of LA mass. No left atrial/left atrial appendage thrombus was detected.  7. Right atrial size was moderately dilated. There is a dialysis catheter within the SVC extending into the right atrium that intermittently abuts the right atrial wall. There is severe calcification of the right atrial wall with extensive extension into and  encompassing a large portion of the right atrial cavity.  8. There is Moderate (Grade III) layered plaque. Conclusion(s)/Recommendation(s): Moderate LV dysfunction with EF 30-35%. Severe funcitonal mitral regurgistation with EROA 0.68cm2  and MR volume 106cc with LUPV flow reversal. There is a dialysis catheter within the SVC extending into the right atrium that intermittently abuts the right atrial wall. There is severe calcification of the right atrial wall with extensive extension into and encompassing a large portion of the right atrial cavity. This calcification does not appear to involve the dialysis catheter. Compared to the TEE of 09/27/21, the right atrial wall calcification was present on prior study but is more extensive on current study. FIndings were discussed with Dr. Bjorn Pippin and Dr. Gala Romney. FINDINGS  Left Ventricle: Left ventricular ejection fraction, by estimation, is 30 to 35%. The left ventricle has moderately decreased function. The left ventricle demonstrates global hypokinesis. The left ventricular internal cavity size was normal in size. There is no left ventricular hypertrophy. Right Ventricle: The right ventricular size is normal. No increase in right ventricular wall thickness. Right ventricular systolic function is normal. Left Atrium: No evidence of LA mass. Left atrial size was normal in size. No left atrial/left atrial appendage thrombus was detected. Right Atrium: Right atrial size was moderately dilated. There is a dialysis catheter within the SVC extending into the right atrium that intermittently abuts the right atrial wall. There is severe calcification of the right atrial wall with extensive extension into and encompassing a large portion of the right atrial cavity. Pericardium: There is no evidence of pericardial effusion. Mitral Valve: There is significant calcification of the subvalvular apparatus. The mitral valve is degenerative in appearance. There is mild calcification of  the mitral valve leaflet(s). Moderate mitral annular calcification. Severe mitral valve regurgitation. No evidence of mitral valve stenosis. Tricuspid Valve: The tricuspid valve is normal in structure. Tricuspid valve regurgitation is moderate . No evidence of tricuspid stenosis. Aortic Valve: AVA 1.53cm2 by planimetry consistent with mild aortic stenosis. The aortic valve is tricuspid. There is moderate calcification of the aortic valve. Aortic valve regurgitation is not visualized. Mild aortic stenosis is present. Pulmonic Valve: The pulmonic valve was normal in structure. Pulmonic valve regurgitation is not visualized. No evidence of pulmonic stenosis. Aorta: The aortic root is normal in size and structure. There is moderate (Grade III) layered plaque. Venous: A pattern of systolic flow reversal, suggestive of severe mitral regurgitation is recorded from the left upper pulmonary vein. IAS/Shunts: No atrial level shunt detected by color flow Doppler. Additional Comments: A HD catheter present in SVC is visualized. Spectral Doppler performed. MR Peak grad:    117.5 mmHg MR Mean grad:    61.0 mmHg MR Vmax:         542.00 cm/s MR Vmean:        361.0 cm/s MR PISA:         5.67 cm MR PISA Eff ROA: 36 mm MR PISA Radius:  0.95 cm Armanda Magic MD Electronically signed by Armanda Magic MD Signature Date/Time: 11/11/2022/9:48:54 PM    Final (Updated)    EP STUDY  Result Date: 11/11/2022 See surgical note for result.   Medications:  sodium thiosulfate 25 g in sodium chloride 0.9 % 200 mL 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 (11/10/22 2240)    aspirin EC  81 mg Oral Daily   atorvastatin  10 mg Oral Daily   dicyclomine  10 mg Oral TID   docusate sodium  100 mg Oral Daily   fentaNYL  1 patch Transdermal Q72H   gabapentin  200 mg Oral Daily   heparin  5,000 Units Subcutaneous Q8H   heparin sodium (porcine)  levothyroxine  100 mcg Oral Q0600   melatonin  5 mg Oral QHS   midodrine  10 mg Oral Q M,W,F-2000    pantoprazole  40 mg Oral Daily   polyethylene glycol  17 g Oral Daily   senna  2 tablet Oral Daily   sucroferric oxyhydroxide  1,000 mg Oral TID WC    Dialysis Orders: [facility] MWF   . 4h  119kg  2/2 bath  Heparin 5000  [facility] TDC  Mircera 50 mcg  (last given 11/03/22) Venofer 50 mg  w wkly hd   Hectorol 4 mcg   Sodium thiosulfate 12.5g post HD  Assessment/Plan: Weakness=  now thought to be cardiac related --> EF 30-35%, severe MVR.  Cardiology following and w/u underway - TEE completed 8/6, see below. ESRD -  HD MWF. Declined HD Friday so had HD Sat instead, then HD yesterday. On HD. UFG lowered to 1-2L since Bps are soft and now under EDW. Will need to adjust EDW at discharge. Chronic HF p EF = EF 30-35% cardiology following. Reviewed Cardiology's note: with severe MVR and not a candidate for RHC, hold off on LHC unless pt is having chest pain. No further interventions planned at this time. Poor prognosis overall (see note from 8/7). Hypertension/volume  - BP's good, wt's down from 123 --> 118 here. Dry wt was 119kg.  Euvolemic on exam. Initial CXR w/o edema.  Anemia  -  HGB now 9.8. ESA last given on 7/29 so not due yet. 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

## 2022-11-12 NOTE — Procedures (Signed)
HD Note:  Some information was entered later than the data was gathered due to patient care needs. The stated time with the data is accurate.  Received patient in bed to unit.   Alert and oriented.   Informed consent signed and in chart.   Patient stated that she was weighed this morning and felt that it was too much to take all the pillows out from under her here.  No weight was done while on the unit.  TX duration: 2.9 hours  Patient BP dropped, medication given, 100 ml NS bolus given.  Patient choose to end treatment early.  AMA for treatment signed.  See flowsheet for details  Patient complained of head  Transported back to the room   Alert, without acute distress.   Access used: Left upper chest HD catheter Access issues: None  Total UF removed: 1400 ml, 1500 ml removed, 100 ml NS returned.  Hand-off given to patient's nurse.    L. Dareen Piano, RN [facility].

## 2022-11-12 NOTE — Plan of Care (Signed)
  Problem: Health Behavior/Discharge Planning: Goal: Ability to manage health-related needs will improve Outcome: Progressing   Problem: Clinical Measurements: Goal: Ability to maintain clinical measurements within normal limits will improve Outcome: Progressing Goal: Will remain free from infection Outcome: Progressing Goal: Diagnostic test results will improve Outcome: Progressing   Problem: Coping: Goal: Level of anxiety will decrease Outcome: Progressing

## 2022-11-12 NOTE — Progress Notes (Signed)
Rounding Note    Patient Name: Courtney Gray Date of Encounter: 11/12/2022  [facility] Cardiologist: None   Subjective   Denies shortness of breath or chest pain  Inpatient Medications    Scheduled Meds:  aspirin EC  81 mg Oral Daily   atorvastatin  10 mg Oral Daily   dicyclomine  10 mg Oral TID   docusate sodium  100 mg Oral Daily   fentaNYL  1 patch Transdermal Q72H   gabapentin  200 mg Oral Daily   heparin  5,000 Units Subcutaneous Q8H   levothyroxine  100 mcg Oral Q0600   melatonin  5 mg Oral QHS   midodrine  10 mg Oral Q M,W,F-2000   pantoprazole  40 mg Oral Daily   polyethylene glycol  17 g Oral Daily   senna  2 tablet Oral Daily   sucroferric oxyhydroxide  1,000 mg Oral TID WC   Continuous Infusions:  sodium thiosulfate 25 g in sodium chloride 0.9 % 200 mL 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 (11/10/22 2240)   PRN Meds: cyclobenzaprine, diphenhydrAMINE, [START ON 11/13/2022] heparin, menthol-cetylpyridinium, oxyCODONE   Vital Signs    Vitals:   11/12/22 0752 11/12/22 0851 11/12/22 0906 11/12/22 0932  BP: 104/81 (!) 112/42 (!) 102/91 (!) 115/41  Pulse: 78 78 74 76  Resp: 16 18 18 20   Temp: 97.8 F (36.6 C) 97.9 F (36.6 C)    TempSrc: Oral     SpO2: 98% 100% 100% 100%  Weight:      Height:       No intake or output data in the 24 hours ending 11/12/22 0952     11/12/2022    4:57 AM 11/11/2022    7:08 AM 11/08/2022    4:20 PM  Last 3 Weights  Weight (lbs) 259 lb 14.8 oz 260 lb --  Weight (kg) 117.9 kg 117.935 kg --      Telemetry    Normal sinus rhythm  - Personally Reviewed  ECG    No new ECG tracing today. - Personally Reviewed  Physical Exam   GEN: Morbidly obese African-American. No acute distress.  On 3L of O2 via nasal cannula. Neck: JVD difficult to assess due to body habitus. Cardiac: RRR. III/VI systolic murmur. No rubs or gallops. Respiratory: Clear to auscultation bilaterally. No wheezes, rhonchi, or rales. GI: Soft,  non-distended, and non-tender. MS: S/p bilateral above knee amputations. Neuro:  No focal deficits. Psych: Normal affect. Responds appropriately.  Labs    High Sensitivity Troponin:   Recent Labs  Lab 11/05/22 1041 11/05/22 1241  TROPONINIHS 242* 239*     Chemistry Recent Labs  Lab 11/05/22 1041 11/05/22 1155 11/07/22 0703 11/08/22 1150 11/10/22 0042 11/11/22 0548 11/11/22 2333  NA 137   < > 134* 135 133* 136  --   K 5.3*   < > 4.7 5.2* 4.6 4.4  --   CL 94*   < > 94* 93* 92* 93*  --   CO2 23   < > 26 23 23 23   --   GLUCOSE 87   < > 106* 100* 126* 99  --   BUN 55*   < > 37* 57* 45* 28*  --   CREATININE 9.93*   < > 7.45* 9.41* 7.90* 4.89*  --   CALCIUM 9.4   < > 9.7 9.0 9.4 9.8  --   MG  --   --   --   --  1.8 1.7 1.9  PROT  8.1  --   --   --   --   --   --   ALBUMIN 3.7  --  3.6  --   --   --   --   AST 23  --   --   --   --   --   --   ALT 21  --   --   --   --   --   --   ALKPHOS 80  --   --   --   --   --   --   BILITOT 0.8  --   --   --   --   --   --   GFRNONAA 4*   < > 6* 5* 6* 10*  --   ANIONGAP 20*   < > 14 19* 18* 20*  --    < > = values in this interval not displayed.    Lipids No results for input(s): "CHOL", "TRIG", "HDL", "LABVLDL", "LDLCALC", "CHOLHDL" in the last 168 hours.  Hematology Recent Labs  Lab 11/08/22 1150 11/10/22 0042 11/11/22 0548  WBC 6.6 6.4 7.3  RBC 3.46* 3.33* 3.52*  HGB 10.2* 9.7* 10.4*  HCT 33.2* 31.4* 33.1*  MCV 96.0 94.3 94.0  MCH 29.5 29.1 29.5  MCHC 30.7 30.9 31.4  RDW 15.6* 16.2* 16.6*  PLT 116* 123* 120*   Thyroid  Recent Labs  Lab 11/06/22 0310  TSH 4.008    BNP Recent Labs  Lab 11/05/22 1041  BNP 1,832.9*    DDimer No results for input(s): "DDIMER" in the last 168 hours.   Radiology    ECHO TEE  Result Date: 11/11/2022    TRANSESOPHOGEAL ECHO REPORT   Patient Name:   Courtney Gray Date of Exam: 11/11/2022 Medical Rec #:  [id number]       Height:       65.0 in Accession #:    [id number]      Weight:        260.0 lb Date of Birth:  [birth year]        BSA:          2.211 m Patient Age:    52 years        BP:           128/116 mmHg Patient Gender: F               HR:           92 bpm. Exam Location:  Inpatient Procedure: Transesophageal Echo, 3D Echo, Color Doppler and Cardiac Doppler Indications:     Mitral Regurgitation i34.0  History:         Patient has prior history of Echocardiogram examinations, most                  recent 11/06/2022. CHF, CAD; Risk Factors:Hypertension, Diabetes                  and Dyslipidemia.  Sonographer:     Irving Burton Senior RDCS Referring Phys:  [phone] TRACI R TURNER Diagnosing Phys: Armanda Magic MD PROCEDURE: After discussion of the risks and benefits of a TEE, an informed consent was obtained from the patient. The transesophogeal probe was passed without difficulty through the esophogus of the patient. Imaged were obtained with the patient in a supine position. Sedation performed by different physician. The patient was monitored while under deep sedation. Anesthestetic sedation was provided intravenously by Anesthesiology: 283mg  of Propofol, 60mg  of  Lidocaine. Image quality was good. The patient's vital signs; including heart rate, blood pressure, and oxygen saturation; remained stable throughout the procedure. The patient developed no complications during the procedure.  IMPRESSIONS  1. Left ventricular ejection fraction, by estimation, is 30 to 35%. The left ventricle has moderately decreased function. The left ventricle demonstrates global hypokinesis.  2. Right ventricular systolic function is normal. The right ventricular size is normal. A HD catheter present in SVC is visualized.  3. There is significant calcification of the subvalvular apparatus.. The mitral valve is degenerative. Severe mitral valve regurgitation. No evidence of mitral stenosis. Moderate mitral annular calcification. A pattern of systolic flow reversal, suggestive of severe mitral regurgitation is recorded from the left  upper pulmonary vein.     MR radius 1.3cm. MR EROA 0.68cm2. MR Volume 106cc. There appears to be severe tethering of the posterior mitral valve leaflet related to LV dysfunction.  4. Tricuspid valve regurgitation is moderate.  5. AVA 1.53cm2 by planimetry consistent with mild aortic stenosis.. The aortic valve is tricuspid. There is moderate calcification of the aortic valve. Aortic valve regurgitation is not visualized. Mild aortic valve stenosis.  6. No evidence of LA mass. No left atrial/left atrial appendage thrombus was detected.  7. Right atrial size was moderately dilated. There is a dialysis catheter within the SVC extending into the right atrium that intermittently abuts the right atrial wall. There is severe calcification of the right atrial wall with extensive extension into and encompassing a large portion of the right atrial cavity.  8. There is Moderate (Grade III) layered plaque. Conclusion(s)/Recommendation(s): Moderate LV dysfunction with EF 30-35%. Severe funcitonal mitral regurgistation with EROA 0.68cm2 and MR volume 106cc with LUPV flow reversal. There is a dialysis catheter within the SVC extending into the right atrium that intermittently abuts the right atrial wall. There is severe calcification of the right atrial wall with extensive extension into and encompassing a large portion of the right atrial cavity. This calcification does not appear to involve the dialysis catheter. Compared to the TEE of 09/27/21, the right atrial wall calcification was present on prior study but is more extensive on current study. FIndings were discussed with Dr. Bjorn Pippin and Dr. Gala Romney. FINDINGS  Left Ventricle: Left ventricular ejection fraction, by estimation, is 30 to 35%. The left ventricle has moderately decreased function. The left ventricle demonstrates global hypokinesis. The left ventricular internal cavity size was normal in size. There is no left ventricular hypertrophy. Right Ventricle: The right  ventricular size is normal. No increase in right ventricular wall thickness. Right ventricular systolic function is normal. Left Atrium: No evidence of LA mass. Left atrial size was normal in size. No left atrial/left atrial appendage thrombus was detected. Right Atrium: Right atrial size was moderately dilated. There is a dialysis catheter within the SVC extending into the right atrium that intermittently abuts the right atrial wall. There is severe calcification of the right atrial wall with extensive extension into and encompassing a large portion of the right atrial cavity. Pericardium: There is no evidence of pericardial effusion. Mitral Valve: There is significant calcification of the subvalvular apparatus. The mitral valve is degenerative in appearance. There is mild calcification of the mitral valve leaflet(s). Moderate mitral annular calcification. Severe mitral valve regurgitation. No evidence of mitral valve stenosis. Tricuspid Valve: The tricuspid valve is normal in structure. Tricuspid valve regurgitation is moderate . No evidence of tricuspid stenosis. Aortic Valve: AVA 1.53cm2 by planimetry consistent with mild aortic stenosis. The aortic valve is  tricuspid. There is moderate calcification of the aortic valve. Aortic valve regurgitation is not visualized. Mild aortic stenosis is present. Pulmonic Valve: The pulmonic valve was normal in structure. Pulmonic valve regurgitation is not visualized. No evidence of pulmonic stenosis. Aorta: The aortic root is normal in size and structure. There is moderate (Grade III) layered plaque. Venous: A pattern of systolic flow reversal, suggestive of severe mitral regurgitation is recorded from the left upper pulmonary vein. IAS/Shunts: No atrial level shunt detected by color flow Doppler. Additional Comments: A HD catheter present in SVC is visualized. Spectral Doppler performed. MR Peak grad:    117.5 mmHg MR Mean grad:    61.0 mmHg MR Vmax:         542.00 cm/s MR  Vmean:        361.0 cm/s MR PISA:         5.67 cm MR PISA Eff ROA: 36 mm MR PISA Radius:  0.95 cm Armanda Magic MD Electronically signed by Armanda Magic MD Signature Date/Time: 11/11/2022/9:48:54 PM    Final (Updated)    EP STUDY  Result Date: 11/11/2022 See surgical note for result.   Cardiac Studies   TTE 09/10/2021: Impressions:  1. Poor quality study. Globally LV function appears normal.   2. Left ventricular ejection fraction, by estimation, is 50 to 55%. The  left ventricle has low normal function. Left ventricular endocardial  border not optimally defined to evaluate regional wall motion. There is  moderate concentric left ventricular  hypertrophy. Left ventricular diastolic parameters are consistent with  Grade II diastolic dysfunction (pseudonormalization).   3. Right ventricular systolic function was not well visualized. The right  ventricular size is not well visualized. There is mildly elevated  pulmonary artery systolic pressure.   4. Left atrial size was mild to moderately dilated.   5. The mitral valve is degenerative. Trivial mitral valve regurgitation.  No evidence of mitral stenosis.   6. Tricuspid valve regurgitation is mild to moderate.   7. The aortic valve is tricuspid. Aortic valve regurgitation is not  visualized. Mild aortic valve stenosis.   8. The inferior vena cava is normal in size with <50% respiratory  variability, suggesting right atrial pressure of 8 mmHg.  _______________     TEE 09/27/2021: Impression:  1. Dialysis catheter noted in SVC; calcified right atrial mass noted  (etiology unclear; possible calcified thrombus); if pt can tolerate,  cardiac MRI could provide more definitive evaluation.   2. Left ventricular ejection fraction, by estimation, is 65 to 70%. The  left ventricle has normal function. The left ventricle has no regional  wall motion abnormalities.   3. Right ventricular systolic function is normal. The right ventricular  size is  normal.   4. Left atrial size was mildly dilated. No left atrial/left atrial  appendage thrombus was detected.   5. Right atrial size was mildly dilated.   6. The mitral valve is normal in structure. Mild mitral valve  regurgitation. Moderate mitral annular calcification.   7. Tricuspid valve regurgitation is moderate.   8. The aortic valve is tricuspid. Aortic valve regurgitation is not  visualized. Aortic valve sclerosis is present, with no evidence of aortic  valve stenosis.   9. There is mild (Grade II) plaque involving the descending aorta.  10. Evidence of atrial level shunting detected by color flow Doppler.  Agitated saline contrast bubble study was positive with shunting observed  within 3-6 cardiac cycles suggestive of interatrial shunt.  _______________  TTE 11/06/2022: 1.  Left ventricular ejection fraction, by estimation, is 30 to 35%. The  left ventricle has moderately decreased function. The left ventricle  demonstrates global hypokinesis. The left ventricular internal cavity size  was moderately dilated. There is  moderate concentric left ventricular hypertrophy. Left ventricular  diastolic parameters are consistent with Grade II diastolic dysfunction  (pseudonormalization).   2. Right ventricular systolic function is low normal. The right  ventricular size is not well visualized. There is moderately elevated  pulmonary artery systolic pressure.   3. Echodense area visualized in several views but not well seen. Cannot  exclude mass. Left atrial size was moderately dilated.   4. Right atrial size was moderately dilated.   5. The mitral valve is degenerative. Severe mitral valve regurgitation.  Severe mitral annular calcification.   6. Tricuspid valve regurgitation is mild to moderate.   7. The aortic valve is grossly normal. There is moderate calcification of  the aortic valve. There is moderate thickening of the aortic valve. Aortic  valve regurgitation is trivial.  Mild aortic valve stenosis. Aortic valve  mean gradient measures 11.5  mmHg.   8. The inferior vena cava is dilated in size with >50% respiratory  variability, suggesting right atrial pressure of 8 mmHg    Patient Profile     52 y.o. 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 Cardiology was consulted for elevated troponin.   Assessment & Plan    Elevated Troponin Patient presented with generalized weakness and was found to have an elevated troponin. High-sensitivity troponin 242 >> 239. No acute ischemic EKG changes. Echo showed a drop in LVEF to 30-35% with global hypokinesis. - No chest pain. - Troponin elevation may be secondary to demand ischemia. However, she does have multiple CV risk factors including PAD. Given drop in EF,  plan is to proceed with [facility] with Dr. Gala Romney. Risks and benefits of cardiac catheterization have been discussed with the patient.  These include bleeding, infection, kidney damage, stroke, heart attack, death.  The patient understands these risks and is willing to proceed. -TEE shows calcified mass in right atrium, severe MR.  Given findings in RA, RHC would be contraindicated.  D/w Bensimhon, since she is not a candidate for intervention on her mitral valve, and as RHC is contraindicated, would hold off on LHC unless having chest pain.  I discussed this with patient, she became upset when I talked about that we could still do a LHC, as she does not want this done.  No further interventions planned.  Medical management recommended.  Unfortunately prognosis is poor given her systolic dysfunction and severe MR for which  she is not a candidate for intervention   Acute on Chronic Combined CHF with Newly Reduced EF Last Echo in 09/2021 showed LVEF of 50-55% with moderate LVH and grade 2 diastolic dysfunction. BNP elevated at 1,832 this admission. Chest x-ray showed enlarged cardiopericardial silhouette with vascular congestion. Echo showed LVEF of 30-35% with global hypokinesis, moderate LVH, and grade 2 diastolic dysfunction as well as low normal RV function. - Volume status difficult to assess due to morbid obesity but does not appear significantly volume overloaded. - Volume status managed via dialysis. - GDMT limited due to  ESRD and hypotension on dialysis days   Possible Calcified Thrombus on Dialysis Catheter Echodense Area in Left Atrium TEE in 09/2021 showed a calcified right atrial mass. Etiology was unclear but possible calcified thrombus. 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 Dialysis catheterization was replaced at that time. TTE this admission did not reveal a mass in the right atrium but did show an echodense area in the left atrium (mass cannot be excluded. Blood cultures this admission negative so far. - TEE 8/6 shows EF 30 to 35%, normal RV function, severe mitral regurgitation, no left atrial mass but severe right atrial calcification with extensive extension into encompassing a large portion of right atrial cavity   Severe Mitral Regurgitation Noted on Echo this admission. -Severe MR on TEE.  Discussed with Dr. Eustaquio Maize was planning RHC/LHC, but given right atrial findings on TEE, not candidate for RHC as above.  Would also not be candidate for MitraClip due to this and is not a surgical candidate.  Medical management recommended.   Hypertension BP soft at times but stable. - Continue Midodrine on dialysis days.   Hyperlipidemia LDL 23 in 08/2021. - Continue home Lipitor 10mg  daily.    ESRD On hemodialysis M/W/F. - Management per Nephrology.     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   Medication Recommendations:  As above Other recommendations (labs, testing, etc):  None Follow up as an outpatient:  Will schedule   For questions or updates, please contact  [facility] Please consult [email or web address] for contact info under        Signed, Little Ishikawa, MD  11/12/2022, 9:52 AM

## 2022-11-12 NOTE — Plan of Care (Signed)
  Problem: Education: Goal: Knowledge of General Education information will improve Description: Including pain rating scale, medication(s)/side effects and non-pharmacologic comfort measures Outcome: Progressing   Problem: Health Behavior/Discharge Planning: Goal: Ability to manage health-related needs will improve Outcome: Progressing   Problem: Clinical Measurements: Goal: Ability to maintain clinical measurements within normal limits will improve Outcome: Progressing Goal: Will remain free from infection Outcome: Progressing Goal: Diagnostic test results will improve Outcome: Progressing Goal: Respiratory complications will improve Outcome: Progressing Goal: Cardiovascular complication will be avoided Outcome: Progressing   Problem: Activity: Goal: Risk for activity intolerance will decrease Outcome: Progressing   Problem: Nutrition: Goal: Adequate nutrition will be maintained Outcome: Progressing   Problem: Coping: Goal: Level of anxiety will decrease Outcome: Progressing   Problem: Elimination: Goal: Will not experience complications related to bowel motility Outcome: Progressing Goal: Will not experience complications related to urinary retention Outcome: Progressing   Problem: Pain Managment: Goal: General experience of comfort will improve Outcome: Progressing   Problem: Safety: Goal: Ability to remain free from injury will improve Outcome: Progressing   Problem: Skin Integrity: Goal: Risk for impaired skin integrity will decrease Outcome: Progressing   Problem: Education: Goal: Ability to demonstrate management of disease process will improve Outcome: Progressing Goal: Ability to verbalize understanding of medication therapies will improve Outcome: Progressing Goal: Individualized Educational Video(s) Outcome: Progressing   Problem: Activity: Goal: Capacity to carry out activities will improve Outcome: Progressing   Problem: Cardiac: Goal:  Ability to achieve and maintain adequate cardiopulmonary perfusion will improve Outcome: Progressing   Problem: Education: Goal: Understanding of CV disease, CV risk reduction, and recovery process will improve Outcome: Progressing Goal: Individualized Educational Video(s) Outcome: Progressing   Problem: Activity: Goal: Ability to return to baseline activity level will improve Outcome: Progressing   Problem: Cardiovascular: Goal: Ability to achieve and maintain adequate cardiovascular perfusion will improve Outcome: Progressing Goal: Vascular access site(s) Level 0-1 will be maintained Outcome: Progressing   Problem: Health Behavior/Discharge Planning: Goal: Ability to safely manage health-related needs after discharge will improve Outcome: Progressing   Problem: Education: Goal: Understanding of CV disease, CV risk reduction, and recovery process will improve Outcome: Progressing Goal: Individualized Educational Video(s) Outcome: Progressing   Problem: Activity: Goal: Ability to return to baseline activity level will improve Outcome: Progressing   Problem: Cardiovascular: Goal: Ability to achieve and maintain adequate cardiovascular perfusion will improve Outcome: Progressing Goal: Vascular access site(s) Level 0-1 will be maintained Outcome: Progressing   Problem: Health Behavior/Discharge Planning: Goal: Ability to safely manage health-related needs after discharge will improve Outcome: Progressing   

## 2022-11-12 NOTE — Progress Notes (Signed)
Subjective: Courtney Gray is a 52 y.o. female with a PMHx of HTN, ESRD on iHD, NSTEMI, obesity and DM who presents with generalized weakness and fatigue concerning for acute on chronic heart failure exacerbation found to have newly reduced EF (30-35%)  Went for dialysis this morning. Tolerated well  Objective: Vital signs in last 24 hours: Vitals:   11/12/22 1100 11/12/22 1154 11/12/22 1202 11/12/22 1225  BP:  (!) 89/13 (!) 70/45 91/69  Pulse: 76 81 80 82  Resp: (!) 22 (!) 0 (!) 7 16  Temp:      TempSrc:      SpO2: 96% 100% 100% 98%  Weight:      Height:       Weight change:   Intake/Output Summary (Last 24 hours) at 11/12/2022 1330 Last data filed at 11/12/2022 1225 Gross per 24 hour  Intake --  Output 1500 ml  Net -1500 ml   Physical Exam: BP 91/69   Pulse 82   Temp 97.9 F (36.6 C)   Resp 16   Ht 5\' 5"  (1.651 m)   Wt 117.9 kg   SpO2 98%   BMI 43.25 kg/m  General appearance: alert, awake, NAD  Head: Normocephalic, without obvious abnormality, atraumatic Lungs: CTAB, normal WOB, on 4L O2 nasal cannula   Heart: Systolic murmur near dialysis access, S1, S2 normal  Abdomen:  soft, NTND, no guarding  Extremities: bilateral above the knee amputations   Lab Results:    Latest Ref Rng & Units 11/12/2022    6:41 AM 11/11/2022    5:48 AM 11/10/2022   12:42 AM  CBC  WBC 4.0 - 10.5 K/uL 5.6  7.3  6.4   Hemoglobin 12.0 - 15.0 g/dL 9.8  16.1  9.7   Hematocrit 36.0 - 46.0 % 31.8  33.1  31.4   Platelets 150 - 400 K/uL 118  120  123       Latest Ref Rng & Units 11/12/2022    9:40 AM 11/11/2022    5:48 AM 11/10/2022   12:42 AM  CMP  Glucose 70 - 99 mg/dL 096  99  045   BUN 6 - 20 mg/dL 43  28  45   Creatinine 0.44 - 1.00 mg/dL 4.09  8.11  9.14   Sodium 135 - 145 mmol/L 135  136  133   Potassium 3.5 - 5.1 mmol/L 4.5  4.4  4.6   Chloride 98 - 111 mmol/L 95  93  92   CO2 22 - 32 mmol/L 21  23  23    Calcium 8.9 - 10.3 mg/dL 9.0  9.8  9.4    Recent Labs    11/11/22 0709   GLUCAP 91    Micro Results: Recent Results (from the past 240 hour(s))  Resp panel by RT-PCR (RSV, Flu A&B, Covid) Anterior Nasal Swab     Status: None   Collection Time: 11/05/22 10:41 AM   Specimen: Anterior Nasal Swab  Result Value Ref Range Status   SARS Coronavirus 2 by RT PCR NEGATIVE NEGATIVE Final   Influenza A by PCR NEGATIVE NEGATIVE Final   Influenza B by PCR NEGATIVE NEGATIVE Final    Comment: (NOTE) The Xpert Xpress SARS-CoV-2/FLU/RSV plus assay is intended as an aid in the diagnosis of influenza from Nasopharyngeal swab specimens and should not be used as a sole basis for treatment. Nasal washings and aspirates are unacceptable for Xpert Xpress SARS-CoV-2/FLU/RSV testing.  Fact Sheet for Patients: BloggerCourse.com  Fact Sheet for Healthcare Providers:  SeriousBroker.it  This test is not yet approved or cleared by the [place] and has been authorized for detection and/or diagnosis of SARS-CoV-2 by FDA under an Emergency Use Authorization (EUA). This EUA will remain in effect (meaning this test can be used) for the duration of the COVID-19 declaration under Section 564(b)(1) of the Act, 21 U.S.C. section 360bbb-3(b)(1), unless the authorization is terminated or revoked.     Resp Syncytial Virus by PCR NEGATIVE NEGATIVE Final    Comment: (NOTE) Fact Sheet for Patients: BloggerCourse.com  Fact Sheet for Healthcare Providers: SeriousBroker.it  This test is not yet approved or cleared by the [place] FDA and has been authorized for detection and/or diagnosis of SARS-CoV-2 by FDA under an Emergency Use Authorization (EUA). This EUA will remain in effect (meaning this test can be used) for the duration of the COVID-19 declaration under Section 564(b)(1) of the Act, 21 U.S.C. section 360bbb-3(b)(1), unless the authorization is terminated  or revoked.  Performed at [facility], [address], [place], [place] [address]   Culture, blood (Routine X 2) w Reflex to ID Panel     Status: None   Collection Time: 11/05/22  4:13 PM   Specimen: BLOOD  Result Value Ref Range Status   Specimen Description BLOOD LEFT UPPER HAND  Final   Special Requests   Final    BOTTLES DRAWN AEROBIC ONLY Blood Culture adequate volume   Culture   Final    NO GROWTH 5 DAYS Performed at [facility], [address], [place], [place] [address]    Report Status 11/10/2022 FINAL  Final  Culture, blood (Routine X 2) w Reflex to ID Panel     Status: None   Collection Time: 11/05/22  6:30 PM   Specimen: BLOOD LEFT HAND  Result Value Ref Range Status   Specimen Description BLOOD LEFT HAND UPPER LOWER  Final   Special Requests   Final    BOTTLES DRAWN AEROBIC AND ANAEROBIC Blood Culture results may not be optimal due to an excessive volume of blood received in culture bottles   Culture   Final    NO GROWTH 5 DAYS Performed at [facility], [address], [place], [place] [address]    Report Status 11/10/2022 FINAL  Final    Studies/Results: ECHO TEE  Result Date: 11/11/2022    TRANSESOPHOGEAL ECHO REPORT   Patient Name:   Courtney Gray Date of Exam: 11/11/2022 Medical Rec #:  [id number]       Height:       65.0 in Accession #:    [id number]      Weight:       260.0 lb Date of Birth:  1970/12/03        BSA:          2.211 m Patient Age:    52 years        BP:           128/116 mmHg Patient Gender: F               HR:           92 bpm. Exam Location:  Inpatient Procedure: Transesophageal Echo, 3D Echo, Color Doppler and Cardiac Doppler Indications:     Mitral Regurgitation i34.0  History:         Patient has prior history of Echocardiogram examinations, most  recent 11/06/2022. CHF, CAD; Risk Factors:Hypertension, Diabetes                  and Dyslipidemia.  Sonographer:     Irving Burton Senior RDCS Referring Phys:  [id number] TRACI R  TURNER Diagnosing Phys: Armanda Magic MD PROCEDURE: After discussion of the risks and benefits of a TEE, an informed consent was obtained from the patient. The transesophogeal probe was passed without difficulty through the esophogus of the patient. Imaged were obtained with the patient in a supine position. Sedation performed by different physician. The patient was monitored while under deep sedation. Anesthestetic sedation was provided intravenously by Anesthesiology: 283mg  of Propofol, 60mg  of Lidocaine. Image quality was good. The patient's vital signs; including heart rate, blood pressure, and oxygen saturation; remained stable throughout the procedure. The patient developed no complications during the procedure.  IMPRESSIONS  1. Left ventricular ejection fraction, by estimation, is 30 to 35%. The left ventricle has moderately decreased function. The left ventricle demonstrates global hypokinesis.  2. Right ventricular systolic function is normal. The right ventricular size is normal. A HD catheter present in SVC is visualized.  3. There is significant calcification of the subvalvular apparatus.. The mitral valve is degenerative. Severe mitral valve regurgitation. No evidence of mitral stenosis. Moderate mitral annular calcification. A pattern of systolic flow reversal, suggestive of severe mitral regurgitation is recorded from the left upper pulmonary vein.     MR radius 1.3cm. MR EROA 0.68cm2. MR Volume 106cc. There appears to be severe tethering of the posterior mitral valve leaflet related to LV dysfunction.  4. Tricuspid valve regurgitation is moderate.  5. AVA 1.53cm2 by planimetry consistent with mild aortic stenosis.. The aortic valve is tricuspid. There is moderate calcification of the aortic valve. Aortic valve regurgitation is not visualized. Mild aortic valve stenosis.  6. No evidence of LA mass. No left atrial/left atrial appendage thrombus was detected.  7. Right atrial size was moderately dilated.  There is a dialysis catheter within the SVC extending into the right atrium that intermittently abuts the right atrial wall. There is severe calcification of the right atrial wall with extensive extension into and encompassing a large portion of the right atrial cavity.  8. There is Moderate (Grade III) layered plaque. Conclusion(s)/Recommendation(s): Moderate LV dysfunction with EF 30-35%. Severe funcitonal mitral regurgistation with EROA 0.68cm2 and MR volume 106cc with LUPV flow reversal. There is a dialysis catheter within the SVC extending into the right atrium that intermittently abuts the right atrial wall. There is severe calcification of the right atrial wall with extensive extension into and encompassing a large portion of the right atrial cavity. This calcification does not appear to involve the dialysis catheter. Compared to the TEE of 09/27/21, the right atrial wall calcification was present on prior study but is more extensive on current study. FIndings were discussed with Dr. Bjorn Pippin and Dr. Gala Romney. FINDINGS  Left Ventricle: Left ventricular ejection fraction, by estimation, is 30 to 35%. The left ventricle has moderately decreased function. The left ventricle demonstrates global hypokinesis. The left ventricular internal cavity size was normal in size. There is no left ventricular hypertrophy. Right Ventricle: The right ventricular size is normal. No increase in right ventricular wall thickness. Right ventricular systolic function is normal. Left Atrium: No evidence of LA mass. Left atrial size was normal in size. No left atrial/left atrial appendage thrombus was detected. Right Atrium: Right atrial size was moderately dilated. There is a dialysis catheter within the SVC extending into the right  atrium that intermittently abuts the right atrial wall. There is severe calcification of the right atrial wall with extensive extension into and encompassing a large portion of the right atrial cavity.  Pericardium: There is no evidence of pericardial effusion. Mitral Valve: There is significant calcification of the subvalvular apparatus. The mitral valve is degenerative in appearance. There is mild calcification of the mitral valve leaflet(s). Moderate mitral annular calcification. Severe mitral valve regurgitation. No evidence of mitral valve stenosis. Tricuspid Valve: The tricuspid valve is normal in structure. Tricuspid valve regurgitation is moderate . No evidence of tricuspid stenosis. Aortic Valve: AVA 1.53cm2 by planimetry consistent with mild aortic stenosis. The aortic valve is tricuspid. There is moderate calcification of the aortic valve. Aortic valve regurgitation is not visualized. Mild aortic stenosis is present. Pulmonic Valve: The pulmonic valve was normal in structure. Pulmonic valve regurgitation is not visualized. No evidence of pulmonic stenosis. Aorta: The aortic root is normal in size and structure. There is moderate (Grade III) layered plaque. Venous: A pattern of systolic flow reversal, suggestive of severe mitral regurgitation is recorded from the left upper pulmonary vein. IAS/Shunts: No atrial level shunt detected by color flow Doppler. Additional Comments: A HD catheter present in SVC is visualized. Spectral Doppler performed. MR Peak grad:    117.5 mmHg MR Mean grad:    61.0 mmHg MR Vmax:         542.00 cm/s MR Vmean:        361.0 cm/s MR PISA:         5.67 cm MR PISA Eff ROA: 36 mm MR PISA Radius:  0.95 cm Armanda Magic MD Electronically signed by Armanda Magic MD Signature Date/Time: 11/11/2022/9:48:54 PM    Final (Updated)    EP STUDY  Result Date: 11/11/2022 See surgical note for result.   Medications: I have reviewed the patient's current medications. Scheduled Meds:  aspirin EC  81 mg Oral Daily   atorvastatin  10 mg Oral Daily   dicyclomine  10 mg Oral TID   docusate sodium  100 mg Oral Daily   fentaNYL  1 patch Transdermal Q72H   gabapentin  200 mg Oral Daily    heparin  5,000 Units Subcutaneous Q8H   heparin sodium (porcine)       levothyroxine  100 mcg Oral Q0600   melatonin  5 mg Oral QHS   midodrine       midodrine  10 mg Oral Q M,W,F-2000   pantoprazole  40 mg Oral Daily   polyethylene glycol  17 g Oral Daily   senna  2 tablet Oral Daily   sucroferric oxyhydroxide  1,000 mg Oral TID WC   Continuous Infusions:  sodium thiosulfate 25 g in sodium chloride 0.9 % 200 mL 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 (11/10/22 2240)   PRN Meds:.cyclobenzaprine, diphenhydrAMINE, heparin sodium (porcine), menthol-cetylpyridinium, midodrine, oxyCODONE  Assessment/Plan: Principal Problem:   Acute exacerbation of CHF (congestive heart failure) (HCC) Active Problems:   ESRD on dialysis (HCC)   Chronic respiratory failure with hypoxia (HCC)   Elevated troponin level not due to acute coronary syndrome   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HCC)   Acute on chronic combined systolic and diastolic CHF (congestive heart failure) (HCC)   Generalized weakness   Elevated troponin   Severe mitral regurgitation   Pressure injury of right ischium, stage 4 (HCC)   Right atrial mass  Lori-Ann Marlett is a 52 y.o. female with a PMHx of HTN, ESRD on iHD, NSTEMI, obesity  and DM who presents with generalized weakness and fatigue concerning for acute on chronic heart failure exacerbation found to have newly reduced EF (30-35%)   Active Problems   #Acute on chronic heart failure exacerbation with newly reduced EF (EF 30-35%) #Elevated troponins  H/o NSTEMI  #Echodense area c/f left atrial mass  TTE showed newly reduced EF at 30-35% with LVH, severe MR, mild-mod TR, and echodense area at left atria possible c/f mass. Results from TEE on 8/6 showed EF still at 30-35% with global hypokinesis, severe MR, no evidence of LA thrombus or mass but rather  severe calcification of the right atrial wall with extension into the right atrial cavity. Plan to manage with dialysis.  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 Will hold LHC given she has not had any chest pain   -Cont bASA  -Daily BMP, CBC, Mg  -Continue nasal cannula, wean O2 as tolerated  -Volume control with dialysis   #ESRD  L tunneled cath  #H/o chronic anemia #Hyperphosphatemia  MWF dialysis schedule. Access via L internal jugular tunneled dialysis catheter. Found to have low immunity to Hepatitis B.    -Nephrology consulted, appreciate recs, resume dialysis on 8/3 -Consider HBV vaccine 2-dose series for dialysis  -Cont midodrine 10mg  during dialysis days due to hypotension  -Cont Velphoro 1000mg  TID   #Insomnia  #Fatigue  Multiple possible etiologies due to chronic conditions. Newly reduced EF likely contributor for recent changes.    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   -WOCN consulted, appreciate recs, cleaned with normal saline -Ischial dressing in place    #Chronic back pain Per chart review, she has been previously trialed on fentanyl patch.   -Cont fentanyl patch 25ug every 72 hrs  -Flexeril PRN  -Oxycodone PRN    #Constipation, improving:  BM following suppository this AM. Continue bowel regiment   -Senna 2 tabs daily   -Colace daily   -Consider suppository   Chronic Problems   #Hypothyroidism: Cont home levothyroxine 100 mcg p.o. daily   #T2DM: A1C 5.1 on June 2024. Diet controlled   #HLD: LDL 23 in 08/2021. Cont home atorvastatin 10 mg daily    #GERD: Cont home Protonix 40mg  daily    #Phantom leg pain: Takes Gabapentin and Lyrica at home. Will  continue home gabapentin 200mg  daily and Lyrica 50mg  daily    #HTN: Previously prescribed ACEi. Not currently taking any home hypertensives. BP stable    #Chronic respiratory failure: uses 3-4L oxygen at home as needed. Currently on 4L nasal cannula. Wean O2 as tolerated    #Peripheral artery disease: s/p bilateral AKA. Managed with ASA and atorvastatin as listed above      DVT prophx: declined DVT prophylaxis per patient   Diet: Renal Bowel: PRN Code: Full    Prior to Admission Living Arrangement: [facility] - [facility] Anticipated Discharge Location: SNF - [facility]  Barriers to Discharge: Medical Workup   Dispo: Admit patient to inpatient with expected length of stay more than 2 midnights.  This is a Psychologist, occupational Note.  The care of the patient was discussed with Dr. Thomasene Ripple and the assessment  and plan formulated with their assistance.  Please see their attached note for official documentation of the daily encounter.   LOS: 2 days   Lona Millard, MS4 11/12/2022, 1:30 PM

## 2022-11-13 DIAGNOSIS — I340 Nonrheumatic mitral (valve) insufficiency: Secondary | ICD-10-CM | POA: Diagnosis not present

## 2022-11-13 DIAGNOSIS — I5189 Other ill-defined heart diseases: Secondary | ICD-10-CM | POA: Diagnosis not present

## 2022-11-13 DIAGNOSIS — J9611 Chronic respiratory failure with hypoxia: Secondary | ICD-10-CM | POA: Diagnosis not present

## 2022-11-13 DIAGNOSIS — I5043 Acute on chronic combined systolic (congestive) and diastolic (congestive) heart failure: Secondary | ICD-10-CM | POA: Diagnosis not present

## 2022-11-13 MED ORDER — SENNA 8.6 MG PO TABS: 2.0000 | ORAL_TABLET | Freq: Every day | ORAL | Status: DC

## 2022-11-13 MED ORDER — FENTANYL 25 MCG/HR TD PT72: 1.0000 | MEDICATED_PATCH | TRANSDERMAL | 0 refills | Status: DC

## 2022-11-13 MED ORDER — BISACODYL 10 MG RE SUPP
10.0000 mg | Freq: Once | RECTAL | Status: DC
  Filled 2022-11-13 (×2): qty 1

## 2022-11-13 MED ORDER — MIDODRINE HCL 5 MG PO TABS: 10.0000 mg | ORAL_TABLET | ORAL | Status: DC

## 2022-11-13 NOTE — Plan of Care (Signed)
  Problem: Education: Goal: Knowledge of General Education information will improve Description: Including pain rating scale, medication(s)/side effects and non-pharmacologic comfort measures Outcome: Adequate for Discharge   Problem: Health Behavior/Discharge Planning: Goal: Ability to manage health-related needs will improve Outcome: Adequate for Discharge   Problem: Clinical Measurements: Goal: Ability to maintain clinical measurements within normal limits will improve Outcome: Adequate for Discharge Goal: Will remain free from infection Outcome: Adequate for Discharge Goal: Diagnostic test results will improve Outcome: Adequate for Discharge Goal: Respiratory complications will improve Outcome: Adequate for Discharge Goal: Cardiovascular complication will be avoided Outcome: Adequate for Discharge   Problem: Activity: Goal: Risk for activity intolerance will decrease Outcome: Adequate for Discharge   Problem: Nutrition: Goal: Adequate nutrition will be maintained Outcome: Adequate for Discharge   Problem: Coping: Goal: Level of anxiety will decrease Outcome: Adequate for Discharge   Problem: Elimination: Goal: Will not experience complications related to bowel motility Outcome: Adequate for Discharge Goal: Will not experience complications related to urinary retention Outcome: Adequate for Discharge   Problem: Pain Managment: Goal: General experience of comfort will improve Outcome: Adequate for Discharge   Problem: Safety: Goal: Ability to remain free from injury will improve Outcome: Adequate for Discharge   Problem: Skin Integrity: Goal: Risk for impaired skin integrity will decrease Outcome: Adequate for Discharge   Problem: Education: Goal: Ability to demonstrate management of disease process will improve Outcome: Adequate for Discharge Goal: Ability to verbalize understanding of medication therapies will improve Outcome: Adequate for Discharge Goal:  Individualized Educational Video(s) Outcome: Adequate for Discharge   Problem: Activity: Goal: Capacity to carry out activities will improve Outcome: Adequate for Discharge   Problem: Cardiac: Goal: Ability to achieve and maintain adequate cardiopulmonary perfusion will improve Outcome: Adequate for Discharge   Problem: Education: Goal: Understanding of CV disease, CV risk reduction, and recovery process will improve Outcome: Adequate for Discharge Goal: Individualized Educational Video(s) Outcome: Adequate for Discharge   Problem: Activity: Goal: Ability to return to baseline activity level will improve Outcome: Adequate for Discharge   Problem: Cardiovascular: Goal: Ability to achieve and maintain adequate cardiovascular perfusion will improve Outcome: Adequate for Discharge Goal: Vascular access site(s) Level 0-1 will be maintained Outcome: Adequate for Discharge   Problem: Health Behavior/Discharge Planning: Goal: Ability to safely manage health-related needs after discharge will improve Outcome: Adequate for Discharge   Problem: Education: Goal: Understanding of CV disease, CV risk reduction, and recovery process will improve Outcome: Adequate for Discharge Goal: Individualized Educational Video(s) Outcome: Adequate for Discharge   Problem: Activity: Goal: Ability to return to baseline activity level will improve Outcome: Adequate for Discharge   Problem: Cardiovascular: Goal: Ability to achieve and maintain adequate cardiovascular perfusion will improve Outcome: Adequate for Discharge Goal: Vascular access site(s) Level 0-1 will be maintained Outcome: Adequate for Discharge   Problem: Health Behavior/Discharge Planning: Goal: Ability to safely manage health-related needs after discharge will improve Outcome: Adequate for Discharge

## 2022-11-13 NOTE — Progress Notes (Signed)
D/C order noted. Contacted [facility] to be advised of pt's d/c today and that pt should resume care tomorrow.   Olivia Canter Renal Navigator [phone]

## 2022-11-13 NOTE — Plan of Care (Signed)
[facility] Patient Discharge Orders- [facility] CLINIC: [facility]-dc to SNF  Patient's name: Courtney Gray Admit/DC Dates: 11/05/2022 - 11/13/2022  Discharge Diagnoses: Acute exacerbation of CHF: Found to have newly reduced EF 30-35%, LV global hypokinesis, severe MR, and severe right atrial wall calcification extending into the right atrial cavity. She is not a candidate for valve repair and RHC is contraindicated. Hold off on LHC given lack of anginal symptoms. Plan to medical manage with dialysis. She is not on any home GDMT     Aranesp: Given: No, last ESA given outpatient on 7/29 Last Hgb: 10 PRBC's Given: No  ESA dose for discharge: Resume mircera 50 mcg IV q 2 weeks  IV Iron dose at discharge: Resume weekly Venofer  Heparin change: No  EDW Change: No  Bath Change: No  Access intervention/Change: No  Hectorol change: No  Discharge Labs: Calcium 9.5 Phosphorus 5.8 Albumin 3.3 K+ 4.5  IV Antibiotics: No  On Coumadin?: No   OTHER/APPTS/LAB ORDERS: Nursing: Patient receives Midodrine 10mg  on HD days. It is okay for her to receive an extra 10mg  dose mid-run at AF if needed. Keep SBP > 100 on HD. Thank you    D/C Meds to be reconciled by nurse after every discharge.  Completed By: Salome Holmes, NP   Reviewed by: MD:______ RN_______

## 2022-11-13 NOTE — Progress Notes (Signed)
   11/13/22 1334  Vitals  ECG Heart Rate 97  MEWS COLOR  MEWS Score Color Green  MEWS Score  MEWS Temp 0  MEWS Systolic 0  MEWS Pulse 0  MEWS RR 0  MEWS LOC 0  MEWS Score 0  Provider Notification  Provider Name/Title Dr. Thomasene Ripple Internal Medicine Resident Physcian  Date Provider Notified 11/13/22  Time Provider Notified 1332  Method of Notification Page  Notification Reason Red med refusal (SQ heparin)  Provider response No new orders  Date of Provider Response 11/13/22  Time of Provider Response 1336

## 2022-11-13 NOTE — TOC Transition Note (Signed)
Transition of Care [facility]) - CM/SW Discharge Note   Patient Details  Name: Courtney Gray MRN: [id number] Date of Birth: Nov 11, 1970  Transition of Care [facility]) CM/SW Contact:  Leander Rams, LCSW Phone Number: 11/13/2022, 2:56 PM   Clinical Narrative:    Patient will DC to: [facility]  Anticipated DC date: 11/13/2022 Family notified: Pt notified. Unable to reach daughter.  Transport by: Sharin Mons   Per MD patient ready for DC to [facility]. RN, patient, patient's family, and facility notified of DC. Discharge Summary and FL2 sent to facility. RN to call report prior to discharge [phone]. DC packet on chart. Ambulance transport requested for patient.   CSW will sign off for now as social work intervention is no longer needed. Please consult [place] again if new needs arise.      Barriers to Discharge: No Barriers Identified   Patient Goals and CMS Choice      Discharge Placement                Patient chooses bed at:  [facility]) Patient to be transferred to facility by: [facility] Name of family member notified: Pt notified.Unable to reach daughter. Patient and family notified of of transfer: 11/13/22  Discharge Plan and Services Additional resources added to the After Visit Summary for                                       Social Determinants of Health (SDOH) Interventions SDOH Screenings   Food Insecurity: No Food Insecurity (11/06/2022)  Housing: Low Risk  (11/06/2022)  Transportation Needs: No Transportation Needs (11/06/2022)  Utilities: Not At Risk (11/06/2022)  Financial Resource Strain: High Risk (03/27/2018)   Received from [facility], [facility]  Social Connections: Unknown (08/20/2021)   Received from [facility], [facility]  Tobacco Use: Low Risk  (11/05/2022)  Health Literacy: Low Risk  (07/14/2020)   Received from [facility], [facility]     Readmission Risk Interventions    09/16/2021    4:36 PM 08/28/2021   11:50 AM  07/12/2021   12:38 PM  Readmission Risk Prevention Plan  Transportation Screening Complete Complete Complete  Medication Review (RN Care Manager)  Complete Complete  PCP or Specialist appointment within 3-5 days of discharge  Complete Complete  HRI or Home Care Consult Complete Complete Complete  SW Recovery Care/Counseling Consult Complete Complete Complete  Palliative Care Screening  Not Applicable Not Applicable  Skilled Nursing Facility Complete Not Applicable Complete    Oletta Lamas, MSW, LCSWA, LCASA Transitions of Care  Clinical Social Worker I

## 2022-11-13 NOTE — Progress Notes (Signed)
[facility] Progress Note   Subjective:    Seen and examined patient at bedside. Patient's daughter also at bedside. Noted her Bps dropped intermittently during yesterday's HD. She is on Midodrine and small amt of fluids were given back. Noted net UF 1.4L. Plan for discharge today.  Objective Vitals:   11/12/22 2307 11/13/22 0430 11/13/22 0435 11/13/22 0713  BP: (!) 141/65 116/73  111/86  Pulse: 85 75  79  Resp:  19  20  Temp: 98.3 F (36.8 C) 98.2 F (36.8 C)  98.2 F (36.8 C)  TempSrc: Oral Oral  Oral  SpO2: 100% 96%  100%  Weight:   120.9 kg   Height:       Physical Exam General: Awake, Alert  Obese, chronically ill appearing Female, NAD  Heart: RRR, 1/6/sem, no rg Lungs: CTA  but distant sounds  2/2 body habitus /  2 l [place] O2, nonlabored  Abdomen: Morbid obese, NABS, soft Extremities: Bilat AKA, no stump edema,  Dialysis Access: L  internal jugular [facility]     Filed Weights   11/11/22 0708 11/12/22 0457 11/13/22 0435  Weight: 117.9 kg 117.9 kg 120.9 kg    Intake/Output Summary (Last 24 hours) at 11/13/2022 1206 Last data filed at 11/13/2022 0436 Gross per 24 hour  Intake 240 ml  Output 1500 ml  Net -1260 ml    Additional Objective Labs: Basic Metabolic Panel: Recent Labs  Lab 11/07/22 0703 11/08/22 1150 11/11/22 0548 11/12/22 0940 11/13/22 0005  NA 134*   < > 136 135 134*  K 4.7   < > 4.4 4.5 4.5  CL 94*   < > 93* 95* 95*  CO2 26   < > 23 21* 26  GLUCOSE 106*   < > 99 110* 134*  BUN 37*   < > 28* 43* 36*  CREATININE 7.45*   < > 4.89* 5.96* 5.23*  CALCIUM 9.7   < > 9.8 9.0 9.5  PHOS 7.2*  --   --  5.8*  --    < > = values in this interval not displayed.   Liver Function Tests: Recent Labs  Lab 11/07/22 0703 11/12/22 0940  ALBUMIN 3.6 3.3*   No results for input(s): "LIPASE", "AMYLASE" in the last 168 hours. CBC: Recent Labs  Lab 11/07/22 0703 11/08/22 1150 11/10/22 0042 11/11/22 0548 11/12/22 0641 11/13/22 0005  WBC 5.7 6.6  6.4 7.3 5.6 7.6  NEUTROABS 3.8  --   --   --   --   --   HGB 10.7* 10.2* 9.7* 10.4* 9.8* 10.0*  HCT 35.1* 33.2* 31.4* 33.1* 31.8* 32.3*  MCV 96.2 96.0 94.3 94.0 98.5 95.3  PLT 99* 116* 123* 120* 118* 124*   Blood Culture    Component Value Date/Time   SDES BLOOD LEFT HAND UPPER LOWER 11/05/2022 1830   SPECREQUEST  11/05/2022 1830    BOTTLES DRAWN AEROBIC AND ANAEROBIC Blood Culture results may not be optimal due to an excessive volume of blood received in culture bottles   CULT  11/05/2022 1830    NO GROWTH 5 DAYS Performed at [facility], [address] [place], [place] [address]    REPTSTATUS 11/10/2022 FINAL 11/05/2022 1830    Cardiac Enzymes: No results for input(s): "CKTOTAL", "CKMB", "CKMBINDEX", "TROPONINI" in the last 168 hours. CBG: Recent Labs  Lab 11/11/22 0709  GLUCAP 91   Iron Studies: No results for input(s): "IRON", "TIBC", "TRANSFERRIN", "FERRITIN" in the last 72 hours. Lab Results  Component Value Date   INR 1.1 09/09/2022   INR 1.3 (H) 05/25/2022   INR 1.3 (H) 10/09/2021   Studies/Results: No results found.  Medications:  sodium thiosulfate 25 g in sodium chloride 0.9 % 200 mL 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 (11/12/22 1345)    aspirin EC  81 mg Oral Daily   atorvastatin  10 mg Oral Daily   dicyclomine  10 mg Oral TID   docusate sodium  100 mg Oral Daily   fentaNYL  1 patch Transdermal Q72H   gabapentin  200 mg Oral Daily   heparin  5,000 Units Subcutaneous Q8H   levothyroxine  100 mcg Oral Q0600   melatonin  5 mg Oral QHS   [START ON 11/14/2022] midodrine  10 mg Oral Q M,W,F-HD   pantoprazole  40 mg Oral Daily   polyethylene glycol  17 g Oral Daily   senna  2 tablet Oral Daily   sucroferric oxyhydroxide  1,000 mg Oral TID WC    Dialysis Orders: [facility] MWF   . 4h  119kg  2/2 bath  Heparin 5000  [facility] TDC  Mircera 50 mcg  (last given 11/03/22) Venofer 50 mg  w wkly hd   Hectorol 4 mcg   Sodium thiosulfate 12.5g post  HD  Assessment/Plan: Weakness=  now thought to be cardiac related --> EF 30-35%, severe MVR.  Cardiology following and w/u underway - TEE completed 8/6, see below. ESRD -  HD MWF. Declined HD Friday so had HD Sat instead, then HD yesterday. On HD. UFG lowered to 1-2L since Bps are soft and now under EDW. Will need to adjust EDW at discharge. Chronic HF p EF = EF 30-35% cardiology following. Reviewed Cardiology's note: with severe MVR and not a candidate for RHC, hold off on LHC unless pt is having chest pain. No further interventions planned at this time. Poor prognosis overall (see note from 8/7). Hypertension/volume  - BP currently stable, wt's down from 123 --> 118 here. Dry wt was 119kg.  Euvolemic on exam. Initial CXR w/o edema.  Anemia  -  HGB now 10. ESA last given on 7/29 so not due yet. 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

## 2022-11-13 NOTE — Plan of Care (Signed)

## 2022-11-13 NOTE — Progress Notes (Signed)
Nursing Discharge Note  Name: Courtney Gray DOB: Mar 16, 1971 MRN: [id number]  Admit Date: 11/05/2022 Discharge Date: 11/13/2022  Kaala Reefer to be discharged to a Skilled Nursing Facility per MD order.  AVS completed, placed in discharge packet for facility review. Discharge packet compiled for facility. Non-emergency ambulance transport arranged. Report called to Optometrist  at [facility].    Allergies as of 11/13/2022       Reactions   Benzonatate Other (See Comments)   Hallucinations   Hydralazine Other (See Comments)   Hallucinations   Amoxicillin Itching, Nausea And Vomiting   Clonidine Other (See Comments)   Altered mental state, insomnia    Chlorhexidine Itching   Patient reports it is the CHG baths that cause her itching not the CHG used for caring for her IV/HD access.     Morphine Itching        Medication List     STOP taking these medications    acetaminophen 500 MG tablet Commonly known as: TYLENOL   lactulose 10 GM/15ML solution Commonly known as: CHRONULAC   loperamide 2 MG tablet Commonly known as: IMODIUM A-D   loratadine 10 MG tablet Commonly known as: CLARITIN   nitroGLYCERIN 0.4 MG SL tablet Commonly known as: NITROSTAT   omeprazole 40 MG capsule Commonly known as: PRILOSEC   oxycodone 5 MG capsule Commonly known as: OXY-IR   polyethylene glycol 17 g packet Commonly known as: MIRALAX / GLYCOLAX   polyvinyl alcohol 1.4 % ophthalmic solution Commonly known as: LIQUIFILM TEARS   sodium chloride 0.9 % SOLN 100 mL with sodium thiosulfate 250 MG/ML SOLN 12.5 g       TAKE these medications    aspirin EC 81 MG tablet Take 1 tablet (81 mg total) by mouth daily. Swallow whole.   atorvastatin 10 MG tablet Commonly known as: LIPITOR Take 1 tablet (10 mg total) by mouth daily. What changed: when to take this   cyclobenzaprine 10 MG tablet Commonly known as: FLEXERIL Take 1 tablet (10 mg total) by mouth 2 (two) times daily as needed  for muscle spasms.   Darbepoetin Alfa 200 MCG/0.4ML Sosy injection Commonly known as: ARANESP Inject 0.4 mLs (200 mcg total) into the skin every Tuesday at 6 PM.   dicyclomine 10 MG capsule Commonly known as: BENTYL Take 10 mg by mouth 3 (three) times daily.   diphenhydrAMINE 50 MG capsule Commonly known as: BENADRYL Take 50 mg by mouth at bedtime as needed for sleep.   docusate sodium 100 MG capsule Commonly known as: COLACE Take 100 mg by mouth 2 (two) times daily.   famotidine 20 MG tablet Commonly known as: PEPCID Take 1 tablet (20 mg total) by mouth daily.   fentaNYL 25 MCG/HR Commonly known as: DURAGESIC Place 1 patch onto the skin every 3 (three) days. Start taking on: November 14, 2022   gabapentin 100 MG capsule Commonly known as: NEURONTIN Take 200 mg by mouth daily.   levothyroxine 100 MCG tablet Commonly known as: SYNTHROID Take 100 mcg by mouth daily.   lidocaine 5 % Commonly known as: LIDODERM Place 1 patch onto the skin daily. Remove & Discard patch within 12 hours or as directed by MD   Lokelma 10 g Pack packet Generic drug: sodium zirconium cyclosilicate Take 10 g by mouth daily.   midodrine 10 MG tablet Commonly known as: PROAMATINE Take 1 tablet (10 mg total) by mouth every Monday, Wednesday, and Friday at 8 PM. Before hemodialysis   nystatin powder Commonly  known as: MYCOSTATIN/NYSTOP Apply 1 Application topically 3 (three) times daily.   oxyCODONE-acetaminophen 5-325 MG tablet Commonly known as: PERCOCET/ROXICET Take 1 tablet by mouth 4 (four) times daily as needed.   OXYGEN Inhale 4 L into the lungs See admin instructions. Oxygen at 4L hr via nasal cannula and will use portable oxygen machine when up in wheelchair.   pantoprazole 40 MG tablet Commonly known as: PROTONIX Take 1 tablet (40 mg total) by mouth daily.   pregabalin 25 MG capsule Commonly known as: LYRICA Take 25 mg by mouth every Monday, Wednesday, and Friday. After  HD What changed: Another medication with the same name was removed. Continue taking this medication, and follow the directions you see here.   senna 8.6 MG Tabs tablet Commonly known as: SENOKOT Take 2 tablets (17.2 mg total) by mouth daily.   simethicone 80 MG chewable tablet Commonly known as: MYLICON Chew 80 mg by mouth every 6 (six) hours as needed for flatulence.   Velphoro 500 MG chewable tablet Generic drug: sucroferric oxyhydroxide Chew 500 mg by mouth 3 (three) times daily with meals.               Discharge Care Instructions  (From admission, onward)           Start     Ordered   11/13/22 0000  Discharge wound care:       Comments: Clean R ischial ulcer with NS, cut a strip of Silver Hydrofiber [place] [phone])  and using Q tip applicator pack into wound bed making sure entire wound bed is covered.  Cover with dry gauze and silicone foam. Patient likes silicone foam edges taped for security of dressing.     2.  Patient does have two areas of full thickness likely r/t calciphylaxis which have FULLY HEALED at this time, one LLQ and one RLQ.  Patient likes to have skin prep applied daily and foam dressings applied to protect area.   11/13/22 1340             Discharge Instructions     Diet - low sodium heart healthy   Complete by: As directed    Discharge wound care:   Complete by: As directed    Clean R ischial ulcer with NS, cut a strip of Silver Hydrofiber [place] [phone])  and using Q tip applicator pack into wound bed making sure entire wound bed is covered.  Cover with dry gauze and silicone foam. Patient likes silicone foam edges taped for security of dressing.     2.  Patient does have two areas of full thickness likely r/t calciphylaxis which have FULLY HEALED at this time, one LLQ and one RLQ.  Patient likes to have skin prep applied daily and foam dressings applied to protect area.   Increase activity slowly   Complete by: As directed         [facility] is to provide transportation to facility for patient. Non-emergency ambulance transport at bedside. Handoff completed with [facility] staff/EMTs.   Patient discharged from hospital unit via stretcher. Stable at time of discharge.

## 2022-11-28 ENCOUNTER — Ambulatory Visit: Payer: 59 | Admitting: Physician Assistant

## 2022-11-28 ENCOUNTER — Encounter: Payer: Self-pay | Admitting: Physician Assistant

## 2022-11-28 NOTE — Progress Notes (Unsigned)
Cardiology Office Note    Date:  12/02/2022  ID:  Courtney Gray, DOB 1970-12-03, MRN [id number] PCP:  Kirstie Peri, MD  Cardiologist:  Charlton Haws, MD  Electrophysiologist:  None   Chief Complaint: f/u CHF, MR  History of Present Illness: .    Courtney Gray is a 52 y.o. 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   She has an extremely complex medical history as above. From cardiac standpoint was first seen by our team with chest pain 04/2021 then NSTEMI in 05/2021. Cath was deferred due to GI bleeding with recommendation to revisit as outpatient. She was not seen again by our team until our reader B performed TEE 09/2021 for complex bacteremia admission showing RA mass. Per TEE report, "Etiology of the right atrial mass remains unclear. Possible calcified thrombus. If she improves and can tolerate cardiac MRI this could be performed later to more fully assess. " Our team then saw her again during 7-11/2022 admission for weakness. EF was down by echo with possible LA density and troponin was elevated. TEE was performed showing  EF 30 to 35%, normal RV function, severe mitral regurgitation, no left atrial mass but severe right atrial calcification with extensive extension into encompassing a large portion of right atrial cavity. There was initial plan for [facility] but RHC was felt contraindicated in setting of RA findings. It was recommended to defer LHC unless she were having chest pain and  the patient did not want this done. She was not felt to be a candidate for intervention for her mitral valve; Dr. Bjorn Pippin commented poor prognosis. BP limited GDMT; required midodrine.  She is seen for follow-up today overall clinically stable from cardiac standpoint. She has struggled with some fatigue but denies overt CP or dyspnea. Her volume status is managed with HD MWF. She reports she refuses the [facility] at her facility due to stomach irritation. She states that she talked to dialysis team about it and that her potassium levels more recently have been low rather than high off this medicine. She reports she is taking colchicine BID for gout in her fingers prescribed by primary care. This is not currently listed on [facility].  Labwork independently reviewed: 7-11/2022 K 4.5, Cr 134, Cr 5.23, Hgb 10, plt 124, MG 1.8, TSH OK, AST ALT OK, albumin 3.7 08/2021 trig 114, LDL 23   ROS: .    Please see the history of present illness.  All other systems are reviewed and otherwise negative.  Studies Reviewed: Marland Kitchen    EKG:  EKG is not ordered today but reviewed from 11/05/22 - some irregularity but suspect NSR 87bpm with occasional PACs, nonspecific STTW changes.MD also felt this represented NSR.  CV Studies: Cardiac studies reviewed are outlined and summarized above. Otherwise please see EMR for full report.   Physical Exam:    VS:  BP 110/68   Pulse 71   Wt 260 lb (117.9 kg) Comment: double amputee  SpO2 100%   BMI 43.27 kg/m    Wt Readings from Last 3 Encounters:  12/02/22 260 lb (117.9 kg)  11/13/22 266 lb 8.6 oz (120.9 kg)  09/09/22 257 lb (116.6 kg)    GEN: Well nourished, well developed in no acute distress NECK: No JVD; No carotid bruits CARDIAC: RRR, 2/6 SEM, no rubs or gallops RESPIRATORY:  Clear to auscultation without rales, wheezing or rhonchi  ABDOMEN: Soft, non-tender, non-distended EXTREMITIES:  No edema; No acute deformity   Asessement and Plan:.    1. Chronic  HFrEF and history of NSTEMI/elevated troponins - appears clinically stable. As above, no plan for invasive ischemic assessment at this time, recommended for medical therapy which has been limited by hypotension requiring midodrine. She also has stated she would not want LHC. Would not add any new HF medicines at this time. Will have her f/u in 3 months at which timing of repeat echocardiogram can be reviewed. Given infection issues and ERSD on HD, do not suspect she is an ICD candidate due to infection risk. Volume status is managed by HD. I will defer lyte management to nephrology.  2. RA calcification - avoiding RHC in the setting of this. No further testing needed at this time.   3. Severe MR - per hospital notes, she was not felt to be a candidate for surgical intervention, so medical approach recommended. Continue volume management by HD.  4. Hyperlipidemia - lipids have been treated and followed by PCP. Note she is on atorvastatin and also recently started on colchicine. As primary care manages/prescribes her atorvastatin I will defer management to them. I asked patient to remind PCP of her colchicine/atorvastatin rx given the drug interaction between the two, in case the dose needs to be adjusted on either.     Disposition: F/u with Dr. Eden Emms in 3 months.  Signed, Laurann Montana, PA-C

## 2022-12-02 ENCOUNTER — Ambulatory Visit: Payer: 59 | Attending: Physician Assistant | Admitting: Physician Assistant

## 2022-12-02 ENCOUNTER — Encounter: Payer: Self-pay | Admitting: Physician Assistant

## 2022-12-02 VITALS — BP 110/68 | HR 71 | Wt 260.0 lb

## 2022-12-02 DIAGNOSIS — I5189 Other ill-defined heart diseases: Secondary | ICD-10-CM

## 2022-12-02 DIAGNOSIS — I340 Nonrheumatic mitral (valve) insufficiency: Secondary | ICD-10-CM | POA: Diagnosis not present

## 2022-12-02 DIAGNOSIS — I5022 Chronic systolic (congestive) heart failure: Secondary | ICD-10-CM | POA: Diagnosis not present

## 2022-12-02 DIAGNOSIS — E785 Hyperlipidemia, unspecified: Secondary | ICD-10-CM | POA: Diagnosis not present

## 2022-12-02 NOTE — Patient Instructions (Signed)
Medication Instructions:  Your physician recommends that you continue on your current medications as directed. Please refer to the Current Medication list given to you today.  *If you need a refill on your cardiac medications before your next appointment, please call your pharmacy*  Lab Work: If you have labs (blood work) drawn today and your tests are completely normal, you will receive your results only by: MyChart Message (if you have MyChart) OR A paper copy in the mail If you have any lab test that is abnormal or we need to change your treatment, we will call you to review the results.  Follow-Up: At [facility], you and your health needs are our priority.  As part of our continuing mission to provide you with exceptional heart care, we have created designated Provider Care Teams.  These Care Teams include your primary Cardiologist (physician) and Advanced Practice Providers (APPs -  Physician Assistants and Nurse Practitioners) who all work together to provide you with the care you need, when you need it.  We recommend signing up for the patient portal called "MyChart".  Sign up information is provided on this After Visit Summary.  MyChart is used to connect with patients for Virtual Visits (Telemedicine).  Patients are able to view lab/test results, encounter notes, upcoming appointments, etc.  Non-urgent messages can be sent to your provider as well.   To learn more about what you can do with MyChart, go to [email or web address].    Your next appointment:   3 month(s)  Provider:   Peter Nishan, MD     

## 2023-01-10 ENCOUNTER — Other Ambulatory Visit: Payer: Self-pay | Admitting: Internal Medicine

## 2023-01-10 DIAGNOSIS — Z1231 Encounter for screening mammogram for malignant neoplasm of breast: Secondary | ICD-10-CM

## 2023-01-13 ENCOUNTER — Inpatient Hospital Stay: Admission: RE | Admit: 2023-01-13 | Payer: 59 | Source: Ambulatory Visit

## 2023-02-02 ENCOUNTER — Other Ambulatory Visit: Payer: Self-pay | Admitting: *Deleted

## 2023-02-02 DIAGNOSIS — M79643 Pain in unspecified hand: Secondary | ICD-10-CM

## 2023-02-10 ENCOUNTER — Ambulatory Visit (HOSPITAL_COMMUNITY): Payer: 59

## 2023-02-12 ENCOUNTER — Ambulatory Visit (HOSPITAL_COMMUNITY): Payer: 59

## 2023-02-12 ENCOUNTER — Encounter (HOSPITAL_BASED_OUTPATIENT_CLINIC_OR_DEPARTMENT_OTHER): Payer: 59 | Admitting: Internal Medicine

## 2023-02-17 ENCOUNTER — Encounter: Payer: 59 | Admitting: Vascular Surgery

## 2023-03-06 ENCOUNTER — Encounter (HOSPITAL_COMMUNITY): Payer: Self-pay | Admitting: Emergency Medicine

## 2023-03-06 ENCOUNTER — Other Ambulatory Visit: Payer: Self-pay

## 2023-03-06 ENCOUNTER — Emergency Department (HOSPITAL_COMMUNITY)
Admission: EM | Admit: 2023-03-06 | Discharge: 2023-03-07 | Disposition: A | Discharge status: Home / Self Care | Payer: 59 | Attending: Emergency Medicine | Admitting: Emergency Medicine

## 2023-03-06 DIAGNOSIS — Z992 Dependence on renal dialysis: Secondary | ICD-10-CM | POA: Insufficient documentation

## 2023-03-06 DIAGNOSIS — M7989 Other specified soft tissue disorders: Secondary | ICD-10-CM | POA: Diagnosis present

## 2023-03-06 DIAGNOSIS — N186 End stage renal disease: Secondary | ICD-10-CM | POA: Diagnosis not present

## 2023-03-06 DIAGNOSIS — R69 Illness, unspecified: Secondary | ICD-10-CM

## 2023-03-06 DIAGNOSIS — Z7982 Long term (current) use of aspirin: Secondary | ICD-10-CM | POA: Diagnosis not present

## 2023-03-06 DIAGNOSIS — L089 Local infection of the skin and subcutaneous tissue, unspecified: Secondary | ICD-10-CM

## 2023-03-06 DIAGNOSIS — R251 Tremor, unspecified: Secondary | ICD-10-CM | POA: Insufficient documentation

## 2023-03-06 NOTE — ED Provider Notes (Signed)
[facility] EMERGENCY DEPARTMENT AT [facility] Provider Note   CSN: [id number] Arrival date & time: 03/06/23  2116     History {Add pertinent medical, surgical, social history, OB history to HPI:1} Chief Complaint  Patient presents with  . Wound Check  . Hand Pain    Courtney Gray is a 52 y.o. female.  HPI     Home Medications Prior to Admission medications   Medication Sig Start Date End Date Taking? Authorizing Provider  aspirin EC 81 MG tablet Take 1 tablet (81 mg total) by mouth daily. Swallow whole. 05/16/22   [place], Eric J, DO  atorvastatin (LIPITOR) 10 MG tablet Take 1 tablet (10 mg total) by mouth daily. 11/28/20   Shon Hale, MD  colchicine 0.6 MG tablet Take by mouth in the morning and at bedtime. gout 11/27/22   [provider]  cyclobenzaprine (FLEXERIL) 10 MG tablet Take 1 tablet (10 mg total) by mouth 2 (two) times daily as needed for muscle spasms. 09/16/22   Netta Corrigan, PA-C  Darbepoetin Alfa (ARANESP) 200 MCG/0.4ML SOSY injection Inject 200 mcg into the skin every 7 (seven) days.    [provider]  dicyclomine (BENTYL) 10 MG capsule Take 10 mg by mouth 4 (four) times daily -  before meals and at bedtime. 11/04/22   [provider]  diphenhydrAMINE (BENADRYL) 50 MG capsule Take 50 mg by mouth at bedtime as needed for sleep.    [provider]  docusate sodium (COLACE) 100 MG capsule Take 100 mg by mouth 2 (two) times daily.    [provider]  famotidine (PEPCID) 20 MG tablet Take 1 tablet (20 mg total) by mouth daily. 05/16/22   [place], Alvira Philips, DO  fentaNYL (DURAGESIC) 25 MCG/HR Place 1 patch onto the skin every 3 (three) days.    [provider]  gabapentin (NEURONTIN) 100 MG capsule Take 200 mg by mouth daily.    [provider]  levothyroxine (SYNTHROID) 100 MCG tablet Take 100 mcg by mouth daily.    [provider]  lidocaine (LIDODERM) 5 % Place 1 patch onto the skin  daily. Remove & Discard patch within 12 hours or as directed by MD    [provider]  midodrine (PROAMATINE) 10 MG tablet Take 1 tablet (10 mg total) by mouth every Monday, Wednesday, and Friday at 8 PM. Before hemodialysis 05/16/22   [place], Alvira Philips, DO  nutrition supplement, JUVEN, (JUVEN) PACK Take 1 packet by mouth 2 (two) times daily between meals.    [provider]  nystatin ointment (MYCOSTATIN) Apply 1 Application topically 3 (three) times daily.    [provider]  oxyCODONE-acetaminophen (PERCOCET/ROXICET) 5-325 MG tablet Take 1 tablet by mouth every 6 (six) hours as needed for moderate pain. 10/30/22   [provider]  OXYGEN Inhale 3 L into the lungs See admin instructions. Oxygen at 4L hr via nasal cannula and will use portable oxygen machine when up in wheelchair. 10/28/21   [provider]  pantoprazole (PROTONIX) 40 MG tablet Take 1 tablet (40 mg total) by mouth daily. 11/29/20   Shon Hale, MD  pregabalin (LYRICA) 25 MG capsule Take 25 mg by mouth every Monday, Wednesday, and Friday. After HD    [provider]  senna (SENOKOT) 8.6 MG TABS tablet Take 2 tablets (17.2 mg total) by mouth daily. Patient taking differently: Take 2 tablets by mouth as needed for moderate constipation. 11/13/22   Monna Fam, MD  simethicone (  MYLICON) 80 MG chewable tablet Chew 80 mg by mouth every 6 (six) hours as needed for flatulence.    [provider]  sodium zirconium cyclosilicate (LOKELMA) 10 g PACK packet Take 10 g by mouth daily. For hyperkalemia    [provider]  sucroferric oxyhydroxide (VELPHORO) 500 MG chewable tablet Chew 500 mg by mouth 3 (three) times daily with meals.    [provider]      Allergies    Benzonatate, Hydralazine, Amoxicillin, Clonidine, Chlorhexidine, and Morphine    Review of Systems   Review of Systems  Physical Exam Updated Vital Signs BP (!) 146/108   Pulse 92   Temp 98.5 F  (36.9 C) (Oral)   Resp 18   SpO2 100%  Physical Exam  ED Results / Procedures / Treatments   Labs (all labs ordered are listed, but only abnormal results are displayed) Labs Reviewed - No data to display  EKG None  Radiology No results found.  Procedures Procedures  {Document cardiac monitor, telemetry assessment procedure when appropriate:1}  Medications Ordered in ED Medications - No data to display  ED Course/ Medical Decision Making/ A&P   {   Click here for ABCD2, HEART and other calculatorsREFRESH Note before signing :1}                              Medical Decision Making  ***  {Document critical care time when appropriate:1} {Document review of labs and clinical decision tools ie heart score, Chads2Vasc2 etc:1}  {Document your independent review of radiology images, and any outside records:1} {Document your discussion with family members, caretakers, and with consultants:1} {Document social determinants of health affecting pt's care:1} {Document your decision making why or why not admission, treatments were needed:1} Final Clinical Impression(s) / ED Diagnoses Final diagnoses:  None    Rx / DC Orders ED Discharge Orders     None

## 2023-03-06 NOTE — ED Triage Notes (Signed)
Pt arrived via EMS from [facility]. Pt c/o bilateral index finger swelling and pain x 1.5 weeks. Pt states the index finger on her R hand has blood around her fingernail. Pt also requests an eval of her wounds to her L side and back. Pt states wounds have been more painful x1.5 to 2 weeks.

## 2023-03-07 MED ORDER — CEPHALEXIN 500 MG PO CAPS: 500.0000 mg | ORAL_CAPSULE | Freq: Three times a day (TID) | ORAL | 0 refills | Status: DC

## 2023-03-07 MED ORDER — CEPHALEXIN 250 MG PO CAPS
500.0000 mg | ORAL_CAPSULE | Freq: Once | ORAL | Status: AC
  Administered 2023-03-07: 500 mg via ORAL
  Filled 2023-03-07: qty 2

## 2023-03-07 NOTE — ED Notes (Signed)
This RN called [facility] x3, no answer at each attempt.

## 2023-03-07 NOTE — ED Notes (Signed)
Discharge paperwork discussed with patient and daughter. Pt & family verbalized understanding of d/c teaching including follow up care, medications and reasons to return to the ED. No needs or questions expressed. Pt required ambulance transport back to [facility]. [facility] transport requested.

## 2023-03-07 NOTE — Discharge Instructions (Addendum)
1.  At this time it appears that your finger wounds are due to excess calcium buildup from kidney failure and dialysis.  You may also have poor circulation making wound healing more difficult.  These types of wounds are hard to cure.  Because your pain is worse and there is some discoloration, you may have an infection that has developed around the more chronic wound.  At this time we will try a course of antibiotics to see if this improves the symptoms.  You will need to follow-up closely with your nephrologist and your primary doctor.  If these wounds are persisting and worsening, you may need to be seen by a hand specialist.

## 2023-03-16 ENCOUNTER — Other Ambulatory Visit: Payer: Self-pay

## 2023-03-16 ENCOUNTER — Emergency Department (HOSPITAL_COMMUNITY): Payer: 59

## 2023-03-16 ENCOUNTER — Inpatient Hospital Stay (HOSPITAL_COMMUNITY): Payer: 59

## 2023-03-16 ENCOUNTER — Inpatient Hospital Stay (HOSPITAL_COMMUNITY)
Admission: EM | Admit: 2023-03-16 | Discharge: 2023-03-25 | DRG: 637 | Disposition: A | Discharge status: Skilled Nursing Facility | Payer: 59 | Source: Skilled Nursing Facility | Attending: Internal Medicine | Admitting: Internal Medicine

## 2023-03-16 ENCOUNTER — Encounter (HOSPITAL_COMMUNITY): Payer: Self-pay | Admitting: Internal Medicine

## 2023-03-16 DIAGNOSIS — E11649 Type 2 diabetes mellitus with hypoglycemia without coma: Principal | ICD-10-CM | POA: Diagnosis present

## 2023-03-16 DIAGNOSIS — Z89612 Acquired absence of left leg above knee: Secondary | ICD-10-CM | POA: Diagnosis not present

## 2023-03-16 DIAGNOSIS — N2581 Secondary hyperparathyroidism of renal origin: Secondary | ICD-10-CM | POA: Diagnosis present

## 2023-03-16 DIAGNOSIS — Z1152 Encounter for screening for COVID-19: Secondary | ICD-10-CM

## 2023-03-16 DIAGNOSIS — I5022 Chronic systolic (congestive) heart failure: Secondary | ICD-10-CM | POA: Diagnosis present

## 2023-03-16 DIAGNOSIS — Z89611 Acquired absence of right leg above knee: Secondary | ICD-10-CM | POA: Diagnosis not present

## 2023-03-16 DIAGNOSIS — Z79890 Hormone replacement therapy (postmenopausal): Secondary | ICD-10-CM

## 2023-03-16 DIAGNOSIS — R531 Weakness: Principal | ICD-10-CM

## 2023-03-16 DIAGNOSIS — E875 Hyperkalemia: Secondary | ICD-10-CM | POA: Diagnosis present

## 2023-03-16 DIAGNOSIS — L98499 Non-pressure chronic ulcer of skin of other sites with unspecified severity: Secondary | ICD-10-CM | POA: Diagnosis present

## 2023-03-16 DIAGNOSIS — Z9981 Dependence on supplemental oxygen: Secondary | ICD-10-CM

## 2023-03-16 DIAGNOSIS — L89894 Pressure ulcer of other site, stage 4: Secondary | ICD-10-CM | POA: Diagnosis present

## 2023-03-16 DIAGNOSIS — G9341 Metabolic encephalopathy: Secondary | ICD-10-CM | POA: Diagnosis present

## 2023-03-16 DIAGNOSIS — Y92129 Unspecified place in nursing home as the place of occurrence of the external cause: Secondary | ICD-10-CM | POA: Diagnosis not present

## 2023-03-16 DIAGNOSIS — N186 End stage renal disease: Secondary | ICD-10-CM | POA: Diagnosis present

## 2023-03-16 DIAGNOSIS — E11622 Type 2 diabetes mellitus with other skin ulcer: Secondary | ICD-10-CM | POA: Diagnosis present

## 2023-03-16 DIAGNOSIS — Z880 Allergy status to penicillin: Secondary | ICD-10-CM

## 2023-03-16 DIAGNOSIS — E1152 Type 2 diabetes mellitus with diabetic peripheral angiopathy with gangrene: Secondary | ICD-10-CM | POA: Diagnosis present

## 2023-03-16 DIAGNOSIS — D631 Anemia in chronic kidney disease: Secondary | ICD-10-CM | POA: Diagnosis present

## 2023-03-16 DIAGNOSIS — E7800 Pure hypercholesterolemia, unspecified: Secondary | ICD-10-CM | POA: Diagnosis present

## 2023-03-16 DIAGNOSIS — Z883 Allergy status to other anti-infective agents status: Secondary | ICD-10-CM

## 2023-03-16 DIAGNOSIS — G4700 Insomnia, unspecified: Secondary | ICD-10-CM | POA: Diagnosis not present

## 2023-03-16 DIAGNOSIS — G8929 Other chronic pain: Secondary | ICD-10-CM | POA: Diagnosis present

## 2023-03-16 DIAGNOSIS — I132 Hypertensive heart and chronic kidney disease with heart failure and with stage 5 chronic kidney disease, or end stage renal disease: Secondary | ICD-10-CM | POA: Diagnosis present

## 2023-03-16 DIAGNOSIS — J189 Pneumonia, unspecified organism: Secondary | ICD-10-CM | POA: Diagnosis present

## 2023-03-16 DIAGNOSIS — E871 Hypo-osmolality and hyponatremia: Secondary | ICD-10-CM | POA: Diagnosis present

## 2023-03-16 DIAGNOSIS — J9611 Chronic respiratory failure with hypoxia: Secondary | ICD-10-CM | POA: Diagnosis present

## 2023-03-16 DIAGNOSIS — Z992 Dependence on renal dialysis: Secondary | ICD-10-CM | POA: Diagnosis not present

## 2023-03-16 DIAGNOSIS — E1122 Type 2 diabetes mellitus with diabetic chronic kidney disease: Secondary | ICD-10-CM | POA: Diagnosis present

## 2023-03-16 DIAGNOSIS — I5189 Other ill-defined heart diseases: Secondary | ICD-10-CM | POA: Diagnosis not present

## 2023-03-16 DIAGNOSIS — M79641 Pain in right hand: Secondary | ICD-10-CM | POA: Diagnosis present

## 2023-03-16 DIAGNOSIS — E162 Hypoglycemia, unspecified: Secondary | ICD-10-CM | POA: Diagnosis not present

## 2023-03-16 DIAGNOSIS — I950 Idiopathic hypotension: Secondary | ICD-10-CM | POA: Diagnosis not present

## 2023-03-16 DIAGNOSIS — Z833 Family history of diabetes mellitus: Secondary | ICD-10-CM

## 2023-03-16 DIAGNOSIS — I739 Peripheral vascular disease, unspecified: Secondary | ICD-10-CM | POA: Diagnosis not present

## 2023-03-16 DIAGNOSIS — Z888 Allergy status to other drugs, medicaments and biological substances status: Secondary | ICD-10-CM

## 2023-03-16 DIAGNOSIS — E039 Hypothyroidism, unspecified: Secondary | ICD-10-CM | POA: Diagnosis present

## 2023-03-16 DIAGNOSIS — K219 Gastro-esophageal reflux disease without esophagitis: Secondary | ICD-10-CM | POA: Diagnosis present

## 2023-03-16 DIAGNOSIS — Z79899 Other long term (current) drug therapy: Secondary | ICD-10-CM

## 2023-03-16 DIAGNOSIS — I2510 Atherosclerotic heart disease of native coronary artery without angina pectoris: Secondary | ICD-10-CM | POA: Diagnosis present

## 2023-03-16 DIAGNOSIS — I252 Old myocardial infarction: Secondary | ICD-10-CM

## 2023-03-16 DIAGNOSIS — T50915A Adverse effect of multiple unspecified drugs, medicaments and biological substances, initial encounter: Secondary | ICD-10-CM | POA: Diagnosis present

## 2023-03-16 DIAGNOSIS — Z7982 Long term (current) use of aspirin: Secondary | ICD-10-CM

## 2023-03-16 DIAGNOSIS — Z5986 Financial insecurity: Secondary | ICD-10-CM

## 2023-03-16 DIAGNOSIS — Z885 Allergy status to narcotic agent status: Secondary | ICD-10-CM

## 2023-03-16 DIAGNOSIS — I998 Other disorder of circulatory system: Secondary | ICD-10-CM | POA: Diagnosis not present

## 2023-03-16 LAB — COMPREHENSIVE METABOLIC PANEL
ALT: 17 U/L (ref 0–44)
ALT: 17 U/L (ref 0–44)
AST: 20 U/L (ref 15–41)
AST: 19 U/L (ref 15–41)
Albumin: 3.2 g/dL — ABNORMAL LOW (ref 3.5–5.0)
Albumin: 2.9 g/dL — ABNORMAL LOW (ref 3.5–5.0)
Alkaline Phosphatase: 74 U/L (ref 38–126)
Alkaline Phosphatase: 63 U/L (ref 38–126)
Anion gap: 21 — ABNORMAL HIGH (ref 5–15)
Anion gap: 18 — ABNORMAL HIGH (ref 5–15)
BUN: 62 mg/dL — ABNORMAL HIGH (ref 6–20)
BUN: 63 mg/dL — ABNORMAL HIGH (ref 6–20)
CO2: 19 mmol/L — ABNORMAL LOW (ref 22–32)
CO2: 20 mmol/L — ABNORMAL LOW (ref 22–32)
Calcium: 9.7 mg/dL (ref 8.9–10.3)
Calcium: 9.1 mg/dL (ref 8.9–10.3)
Chloride: 95 mmol/L — ABNORMAL LOW (ref 98–111)
Chloride: 95 mmol/L — ABNORMAL LOW (ref 98–111)
Creatinine, Ser: 13.59 mg/dL — ABNORMAL HIGH (ref 0.44–1.00)
Creatinine, Ser: 12.88 mg/dL — ABNORMAL HIGH (ref 0.44–1.00)
GFR, Estimated: 3 mL/min — ABNORMAL LOW (ref >60)
GFR, Estimated: 3 mL/min — ABNORMAL LOW (ref >60)
Glucose, Bld: 61 mg/dL — ABNORMAL LOW (ref 70–99)
Glucose, Bld: 132 mg/dL — ABNORMAL HIGH (ref 70–99)
Potassium: 5.5 mmol/L — ABNORMAL HIGH (ref 3.5–5.1)
Potassium: 5.3 mmol/L — ABNORMAL HIGH (ref 3.5–5.1)
Sodium: 135 mmol/L (ref 135–145)
Sodium: 133 mmol/L — ABNORMAL LOW (ref 135–145)
Total Bilirubin: 1 mg/dL (ref <1.2)
Total Bilirubin: 0.8 mg/dL (ref <1.2)
Total Protein: 7.4 g/dL (ref 6.5–8.1)
Total Protein: 6.7 g/dL (ref 6.5–8.1)

## 2023-03-16 LAB — I-STAT ARTERIAL BLOOD GAS, ED
Acid-base deficit: 4 mmol/L — ABNORMAL HIGH (ref 0.0–2.0)
Bicarbonate: 19.4 mmol/L — ABNORMAL LOW (ref 20.0–28.0)
Calcium, Ion: 1.09 mmol/L — ABNORMAL LOW (ref 1.15–1.40)
HCT: 32 % — ABNORMAL LOW (ref 36.0–46.0)
Hemoglobin: 10.9 g/dL — ABNORMAL LOW (ref 12.0–15.0)
O2 Saturation: 100 %
Patient temperature: 98.6
Potassium: 5.6 mmol/L — ABNORMAL HIGH (ref 3.5–5.1)
Sodium: 131 mmol/L — ABNORMAL LOW (ref 135–145)
TCO2: 20 mmol/L — ABNORMAL LOW (ref 22–32)
pCO2 arterial: 29.1 mm[Hg] — ABNORMAL LOW (ref 32–48)
pH, Arterial: 7.432 (ref 7.35–7.45)
pO2, Arterial: 163 mm[Hg] — ABNORMAL HIGH (ref 83–108)

## 2023-03-16 LAB — CBG MONITORING, ED
Glucose-Capillary: 67 mg/dL — ABNORMAL LOW (ref 70–99)
Glucose-Capillary: 142 mg/dL — ABNORMAL HIGH (ref 70–99)
Glucose-Capillary: 25 mg/dL — CL (ref 70–99)
Glucose-Capillary: 81 mg/dL (ref 70–99)
Glucose-Capillary: 127 mg/dL — ABNORMAL HIGH (ref 70–99)
Glucose-Capillary: 74 mg/dL (ref 70–99)
Glucose-Capillary: 66 mg/dL — ABNORMAL LOW (ref 70–99)
Glucose-Capillary: 81 mg/dL (ref 70–99)
Glucose-Capillary: 63 mg/dL — ABNORMAL LOW (ref 70–99)
Glucose-Capillary: 117 mg/dL — ABNORMAL HIGH (ref 70–99)

## 2023-03-16 LAB — I-STAT CHEM 8, ED
BUN: 56 mg/dL — ABNORMAL HIGH (ref 6–20)
Calcium, Ion: 1.11 mmol/L — ABNORMAL LOW (ref 1.15–1.40)
Chloride: 103 mmol/L (ref 98–111)
Creatinine, Ser: 15 mg/dL — ABNORMAL HIGH (ref 0.44–1.00)
Glucose, Bld: 66 mg/dL — ABNORMAL LOW (ref 70–99)
HCT: 37 % (ref 36.0–46.0)
Hemoglobin: 12.6 g/dL (ref 12.0–15.0)
Potassium: 5.5 mmol/L — ABNORMAL HIGH (ref 3.5–5.1)
Sodium: 135 mmol/L (ref 135–145)
TCO2: 22 mmol/L (ref 22–32)

## 2023-03-16 LAB — CBC WITH DIFFERENTIAL/PLATELET
Abs Immature Granulocytes: 0.1 10*3/uL — ABNORMAL HIGH (ref 0.00–0.07)
Basophils Absolute: 0.1 10*3/uL (ref 0.0–0.1)
Basophils Relative: 1 %
Eosinophils Absolute: 0.5 10*3/uL (ref 0.0–0.5)
Eosinophils Relative: 4 %
HCT: 33.9 % — ABNORMAL LOW (ref 36.0–46.0)
Hemoglobin: 10.1 g/dL — ABNORMAL LOW (ref 12.0–15.0)
Immature Granulocytes: 1 %
Lymphocytes Relative: 8 %
Lymphs Abs: 1 10*3/uL (ref 0.7–4.0)
MCH: 29.8 pg (ref 26.0–34.0)
MCHC: 29.8 g/dL — ABNORMAL LOW (ref 30.0–36.0)
MCV: 100 fL (ref 80.0–100.0)
Monocytes Absolute: 1.2 10*3/uL — ABNORMAL HIGH (ref 0.1–1.0)
Monocytes Relative: 10 %
Neutro Abs: 9.6 10*3/uL — ABNORMAL HIGH (ref 1.7–7.7)
Neutrophils Relative %: 76 %
Platelets: 220 10*3/uL (ref 150–400)
RBC: 3.39 MIL/uL — ABNORMAL LOW (ref 3.87–5.11)
RDW: 18.6 % — ABNORMAL HIGH (ref 11.5–15.5)
WBC: 12.4 10*3/uL — ABNORMAL HIGH (ref 4.0–10.5)
nRBC: 0.2 % (ref 0.0–0.2)

## 2023-03-16 LAB — CBC
HCT: 35.7 % — ABNORMAL LOW (ref 36.0–46.0)
Hemoglobin: 10.8 g/dL — ABNORMAL LOW (ref 12.0–15.0)
MCH: 29.7 pg (ref 26.0–34.0)
MCHC: 30.3 g/dL (ref 30.0–36.0)
MCV: 98.1 fL (ref 80.0–100.0)
Platelets: 229 10*3/uL (ref 150–400)
RBC: 3.64 MIL/uL — ABNORMAL LOW (ref 3.87–5.11)
RDW: 18.6 % — ABNORMAL HIGH (ref 11.5–15.5)
WBC: 10.2 10*3/uL (ref 4.0–10.5)
nRBC: 0.2 % (ref 0.0–0.2)

## 2023-03-16 LAB — BASIC METABOLIC PANEL
Anion gap: 18 — ABNORMAL HIGH (ref 5–15)
BUN: 63 mg/dL — ABNORMAL HIGH (ref 6–20)
CO2: 21 mmol/L — ABNORMAL LOW (ref 22–32)
Calcium: 9.2 mg/dL (ref 8.9–10.3)
Chloride: 95 mmol/L — ABNORMAL LOW (ref 98–111)
Creatinine, Ser: 12.88 mg/dL — ABNORMAL HIGH (ref 0.44–1.00)
GFR, Estimated: 3 mL/min — ABNORMAL LOW (ref >60)
Glucose, Bld: 87 mg/dL (ref 70–99)
Potassium: 5.4 mmol/L — ABNORMAL HIGH (ref 3.5–5.1)
Sodium: 134 mmol/L — ABNORMAL LOW (ref 135–145)

## 2023-03-16 LAB — RESP PANEL BY RT-PCR (RSV, FLU A&B, COVID)  RVPGX2
Influenza A by PCR: NEGATIVE
Influenza B by PCR: NEGATIVE
Resp Syncytial Virus by PCR: NEGATIVE
SARS Coronavirus 2 by RT PCR: NEGATIVE

## 2023-03-16 LAB — I-STAT CG4 LACTIC ACID, ED: Lactic Acid, Venous: 1.3 mmol/L (ref 0.5–1.9)

## 2023-03-16 LAB — C-REACTIVE PROTEIN: CRP: 7.9 mg/dL — ABNORMAL HIGH (ref <1.0)

## 2023-03-16 LAB — HEMOGLOBIN A1C
Hgb A1c MFr Bld: 5.1 % (ref 4.8–5.6)
Mean Plasma Glucose: 99.67 mg/dL

## 2023-03-16 LAB — AMMONIA: Ammonia: 32 umol/L (ref 9–35)

## 2023-03-16 MED ORDER — SODIUM ZIRCONIUM CYCLOSILICATE 10 G PO PACK
10.0000 g | PACK | Freq: Once | ORAL | Status: AC
  Administered 2023-03-16: 10 g via ORAL
  Filled 2023-03-16: qty 1

## 2023-03-16 MED ORDER — ACETAMINOPHEN 325 MG PO TABS
650.0000 mg | ORAL_TABLET | Freq: Four times a day (QID) | ORAL | Status: DC | PRN
  Administered 2023-03-17 – 2023-03-21 (×8): 650 mg via ORAL
  Filled 2023-03-16 (×8): qty 2

## 2023-03-16 MED ORDER — SODIUM CHLORIDE 0.9 % IV BOLUS: 500.0000 mL | Freq: Once | INTRAVENOUS | Status: DC

## 2023-03-16 MED ORDER — SODIUM CHLORIDE 0.9 % IV BOLUS
500.0000 mL | INTRAVENOUS | Status: AC
  Administered 2023-03-16: 500 mL via INTRAVENOUS

## 2023-03-16 MED ORDER — SENNA 8.6 MG PO TABS
2.0000 | ORAL_TABLET | Freq: Every day | ORAL | Status: DC
  Administered 2023-03-19 – 2023-03-25 (×3): 17.2 mg via ORAL
  Filled 2023-03-16 (×7): qty 2

## 2023-03-16 MED ORDER — ATORVASTATIN CALCIUM 10 MG PO TABS
10.0000 mg | ORAL_TABLET | Freq: Every day | ORAL | Status: DC
  Administered 2023-03-16 – 2023-03-25 (×9): 10 mg via ORAL
  Filled 2023-03-16 (×9): qty 1

## 2023-03-16 MED ORDER — DEXTROSE 50 % IV SOLN
1.0000 | Freq: Once | INTRAVENOUS | Status: AC
  Administered 2023-03-16: 50 mL via INTRAVENOUS
  Filled 2023-03-16: qty 50

## 2023-03-16 MED ORDER — DEXTROSE-SODIUM CHLORIDE 5-0.45 % IV SOLN: INTRAVENOUS | Status: DC

## 2023-03-16 MED ORDER — ASPIRIN 81 MG PO TBEC
81.0000 mg | DELAYED_RELEASE_TABLET | Freq: Every day | ORAL | Status: DC
  Administered 2023-03-16 – 2023-03-25 (×10): 81 mg via ORAL
  Filled 2023-03-16 (×10): qty 1

## 2023-03-16 MED ORDER — ALBUTEROL SULFATE (2.5 MG/3ML) 0.083% IN NEBU: 2.5000 mg | INHALATION_SOLUTION | RESPIRATORY_TRACT | Status: DC | PRN

## 2023-03-16 MED ORDER — SODIUM CHLORIDE 0.9 % IV SOLN
1.0000 g | Freq: Once | INTRAVENOUS | Status: AC
  Administered 2023-03-16: 1 g via INTRAVENOUS
  Filled 2023-03-16: qty 10

## 2023-03-16 MED ORDER — DEXTROSE-SODIUM CHLORIDE 10-0.45 % IV SOLN
INTRAVENOUS | Status: DC
  Filled 2023-03-16: qty 1000

## 2023-03-16 MED ORDER — LABETALOL HCL 5 MG/ML IV SOLN: 20.0000 mg | INTRAVENOUS | Status: DC | PRN

## 2023-03-16 MED ORDER — HEPARIN SODIUM (PORCINE) 5000 UNIT/ML IJ SOLN
5000.0000 [IU] | Freq: Three times a day (TID) | INTRAMUSCULAR | Status: DC
  Administered 2023-03-24: 5000 [IU] via SUBCUTANEOUS
  Filled 2023-03-16 (×6): qty 1

## 2023-03-16 MED ORDER — GLUCAGON HCL RDNA (DIAGNOSTIC) 1 MG IJ SOLR: 1.0000 mg | INTRAMUSCULAR | Status: DC | PRN

## 2023-03-16 MED ORDER — SODIUM ZIRCONIUM CYCLOSILICATE 5 G PO PACK: 5.0000 g | PACK | Freq: Once | ORAL | Status: DC

## 2023-03-16 MED ORDER — GLUCAGON HCL RDNA (DIAGNOSTIC) 1 MG IJ SOLR
1.0000 mg | Freq: Four times a day (QID) | INTRAMUSCULAR | Status: AC
  Administered 2023-03-16 – 2023-03-17 (×3): 1 mg via INTRAVENOUS
  Filled 2023-03-16 (×3): qty 1

## 2023-03-16 MED ORDER — FAMOTIDINE 20 MG PO TABS
20.0000 mg | ORAL_TABLET | Freq: Every day | ORAL | Status: DC
  Administered 2023-03-16 – 2023-03-25 (×10): 20 mg via ORAL
  Filled 2023-03-16 (×10): qty 1

## 2023-03-16 MED ORDER — GLUCOSE 40 % PO GEL: 1.0000 | Freq: Once | ORAL | Status: DC | PRN

## 2023-03-16 MED ORDER — MIDODRINE HCL 5 MG PO TABS
10.0000 mg | ORAL_TABLET | ORAL | Status: AC
  Administered 2023-03-16: 10 mg via ORAL
  Filled 2023-03-16: qty 2

## 2023-03-16 MED ORDER — GLUCOSE 40 % PO GEL
1.0000 | Freq: Once | ORAL | Status: AC | PRN
  Administered 2023-03-16: 31 g via ORAL
  Filled 2023-03-16: qty 1.21

## 2023-03-16 MED ORDER — SODIUM ZIRCONIUM CYCLOSILICATE 10 G PO PACK
10.0000 g | PACK | Freq: Every day | ORAL | Status: DC
  Administered 2023-03-16: 10 g via ORAL
  Filled 2023-03-16: qty 1

## 2023-03-16 MED ORDER — DEXTROSE 50 % IV SOLN
12.5000 g | INTRAVENOUS | Status: AC
Start: 2023-03-16 — End: 2023-03-16
  Administered 2023-03-16: 12.5 g via INTRAVENOUS
  Filled 2023-03-16: qty 50

## 2023-03-16 MED ORDER — ACETAMINOPHEN 650 MG RE SUPP: 650.0000 mg | Freq: Four times a day (QID) | RECTAL | Status: DC | PRN

## 2023-03-16 MED ORDER — SODIUM CHLORIDE 0.9 % IV SOLN
500.0000 mg | Freq: Once | INTRAVENOUS | Status: AC
  Administered 2023-03-16: 500 mg via INTRAVENOUS
  Filled 2023-03-16: qty 5

## 2023-03-16 MED ORDER — SIMETHICONE 80 MG PO CHEW
80.0000 mg | CHEWABLE_TABLET | Freq: Four times a day (QID) | ORAL | Status: DC | PRN
  Administered 2023-03-22: 80 mg via ORAL
  Filled 2023-03-16: qty 1

## 2023-03-16 MED ORDER — LEVOTHYROXINE SODIUM 100 MCG PO TABS
100.0000 ug | ORAL_TABLET | Freq: Every day | ORAL | Status: DC
  Administered 2023-03-17 – 2023-03-25 (×7): 100 ug via ORAL
  Filled 2023-03-16 (×9): qty 1

## 2023-03-16 NOTE — ED Notes (Signed)
Patient still continuously nodding off in room. Family at beside. Provider notified about this as well as hypotension.

## 2023-03-16 NOTE — ED Provider Notes (Signed)
EMERGENCY DEPARTMENT AT [facility] Provider Note   CSN: [id number] Arrival date & time: 03/16/23  5366     History  Chief Complaint  Patient presents with   Weakness    Courtney Gray is a 52 y.o. female with past medical history seen for ESRD on dialysis, bilateral AKA, morbid obesity, previous NSTEMI, hyperlipidemia, hypertensive urgency, diabetes, CAD who presents from [facility] with concern for altered mental status, per EMS patient was lethargic, weak, she reports that she was unable to speak this morning although she could hear what was going on around her.  She denies any numbness, tingling, excessive fluid gain.  She reports that she feels quite weak right now but the speech problem has resolved, she is wondering whether she could have had some kind of a mini stroke although she reports she is feeling at her at this time.  She denies any chest pain, she reports chronic shortness of breath and is on home oxygen.  She reports that she has had increased coughing recently.  She reports some chills/shaking while in bed last night and tried to call someone to have her evaluated but reports that no one in her facility would come to see what was wrong with her.  Weakness      Home Medications Prior to Admission medications   Medication Sig Start Date End Date Taking? Authorizing Provider  aspirin EC 81 MG tablet Take 1 tablet (81 mg total) by mouth daily. Swallow whole. 05/16/22  Yes [place], Eric J, DO  atorvastatin (LIPITOR) 10 MG tablet Take 1 tablet (10 mg total) by mouth daily. 11/28/20  Yes Emokpae, Courage, MD  cephALEXin (KEFLEX) 500 MG capsule Take 1 capsule (500 mg total) by mouth 3 (three) times daily. 03/07/23  Yes Arby Barrette, MD  cyclobenzaprine (FLEXERIL) 10 MG tablet Take 1 tablet (10 mg total) by mouth 2 (two) times daily as needed for muscle spasms. 09/16/22  Yes Netta Corrigan, PA-C  Darbepoetin Alfa (ARANESP) 200 MCG/0.4ML SOSY injection  Inject 200 mcg into the skin once a week. Every Tuesday   Yes [provider]  dicyclomine (BENTYL) 10 MG capsule Take 10 mg by mouth in the morning, at noon, and at bedtime. 11/04/22  Yes [provider]  docusate sodium (COLACE) 100 MG capsule Take 100 mg by mouth 2 (two) times daily.   Yes [provider]  famotidine (PEPCID) 20 MG tablet Take 1 tablet (20 mg total) by mouth daily. 05/16/22  Yes [place], Eric J, DO  fentaNYL (DURAGESIC) 25 MCG/HR Place 1 patch onto the skin every 3 (three) days.   Yes [provider]  gabapentin (NEURONTIN) 300 MG capsule Take 300 mg by mouth 3 (three) times daily.   Yes [provider]  guaiFENesin (ROBITUSSIN) 100 MG/5ML liquid Take 100 mg by mouth every 4 (four) hours as needed for cough.   Yes [provider]  HYDROcodone-acetaminophen (NORCO/VICODIN) 5-325 MG tablet Take 1 tablet by mouth in the morning, at noon, in the evening, and at bedtime.   Yes [provider]  levothyroxine (SYNTHROID) 100 MCG tablet Take 100 mcg by mouth daily.   Yes [provider]  lidocaine (LIDODERM) 5 % Place 1 patch onto the skin daily. Remove & Discard patch within 12 hours or as directed by MD   Yes [provider]  lidocaine (XYLOCAINE) 5 % ointment Apply 1 Application topically See admin instructions. 1 application every 2 hours as needed for pain  Yes [provider]  midodrine (PROAMATINE) 10 MG tablet Take 1 tablet (10 mg total) by mouth every Monday, Wednesday, and Friday at 8 PM. Before hemodialysis Patient taking differently: Take 10 mg by mouth See admin instructions. Take 10 mg by mouth every Monday, Wednesday, and Friday before hemodialysis 05/16/22  Yes [place], Alvira Philips, DO  nutrition supplement, JUVEN, (JUVEN) PACK Take 1 packet by mouth 2 (two) times daily between meals.   Yes [provider]  oxyCODONE-acetaminophen (PERCOCET/ROXICET) 5-325 MG tablet Take 1 tablet by  mouth every 6 (six) hours as needed for moderate pain. 10/30/22  Yes [provider]  OXYGEN Inhale 4 L into the lungs See admin instructions. Oxygen at 4L hr via nasal cannula and will use portable oxygen machine when up in wheelchair. 10/28/21  Yes [provider]  pantoprazole (PROTONIX) 40 MG tablet Take 1 tablet (40 mg total) by mouth daily. 11/29/20  Yes Shon Hale, MD  Patiromer Sorbitex Calcium (VELTASSA) 16.8 g PACK Take 16.8 g by mouth daily.   Yes [provider]  pregabalin (LYRICA) 75 MG capsule Take 75 mg by mouth 3 (three) times daily.   Yes [provider]  senna (SENOKOT) 8.6 MG TABS tablet Take 2 tablets (17.2 mg total) by mouth daily. 11/13/22  Yes Monna Fam, MD  simethicone (MYLICON) 80 MG chewable tablet Chew 80 mg by mouth every 6 (six) hours as needed for flatulence.   Yes [provider]  sucroferric oxyhydroxide (VELPHORO) 500 MG chewable tablet Chew 500 mg by mouth 3 (three) times daily with meals.   Yes [provider]      Allergies    Benzonatate, Hydralazine, Amoxicillin, Clonidine, Chlorhexidine, and Morphine    Review of Systems   Review of Systems  Neurological:  Positive for weakness.  All other systems reviewed and are negative.   Physical Exam Updated Vital Signs BP (!) 121/94   Pulse 86   Temp 98 F (36.7 C)   Resp 18   Ht 3\' 9"  (1.143 m)   Wt 119.7 kg   SpO2 95%   BMI 91.66 kg/m  Physical Exam Vitals and nursing note reviewed.  Constitutional:      General: She is not in acute distress.    Appearance: Normal appearance. She is obese. She is ill-appearing.     Comments: Chronically ill-appearing but nontoxic, nonseptic  HENT:     Head: Normocephalic and atraumatic.  Eyes:     General:        Right eye: No discharge.        Left eye: No discharge.  Cardiovascular:     Rate and Rhythm: Normal rate and regular rhythm.     Heart sounds: No murmur heard.    No friction rub. No  gallop.  Pulmonary:     Effort: Pulmonary effort is normal.     Breath sounds: Normal breath sounds.  Abdominal:     General: Bowel sounds are normal.     Palpations: Abdomen is soft.  Musculoskeletal:     Comments: Bilateral AKAs  Skin:    General: Skin is warm and dry.     Capillary Refill: Capillary refill takes less than 2 seconds.  Neurological:     Mental Status: She is alert and oriented to person, place, and time.     Comments: Moves all 4 limbs spontaneously, normal strength of bilateral lower extremity amputations.  CN II through XII grossly intact.  Alert and oriented x 3.  Psychiatric:        Mood and Affect: Mood normal.        Behavior: Behavior normal.     ED Results / Procedures / Treatments   Labs (all labs ordered are listed, but only abnormal results are displayed) Labs Reviewed  CBC - Abnormal; Notable for the following components:      Result Value   RBC 3.64 (*)    Hemoglobin 10.8 (*)    HCT 35.7 (*)    RDW 18.6 (*)    All other components within normal limits  COMPREHENSIVE METABOLIC PANEL - Abnormal; Notable for the following components:   Potassium 5.5 (*)    Chloride 95 (*)    CO2 19 (*)    Glucose, Bld 61 (*)    BUN 62 (*)    Creatinine, Ser 13.59 (*)    Albumin 3.2 (*)    GFR, Estimated 3 (*)    Anion gap 21 (*)    All other components within normal limits  CBG MONITORING, ED - Abnormal; Notable for the following components:   Glucose-Capillary 67 (*)    All other components within normal limits  I-STAT CHEM 8, ED - Abnormal; Notable for the following components:   Potassium 5.5 (*)    BUN 56 (*)    Creatinine, Ser 15.00 (*)    Glucose, Bld 66 (*)    Calcium, Ion 1.11 (*)    All other components within normal limits  CBG MONITORING, ED - Abnormal; Notable for the following components:   Glucose-Capillary 25 (*)    All other components within normal limits  CBG MONITORING, ED - Abnormal; Notable for the following components:    Glucose-Capillary 142 (*)    All other components within normal limits  RESP PANEL BY RT-PCR (RSV, FLU A&B, COVID)  RVPGX2  I-STAT CG4 LACTIC ACID, ED  CBG MONITORING, ED    EKG EKG Interpretation Date/Time:  Monday March 16 2023 11:28:33 EST Ventricular Rate:  77 PR Interval:  189 QRS Duration:  124 QT Interval:  389 QTC Calculation: 441 R Axis:   83  Text Interpretation: Sinus rhythm Nonspecific intraventricular conduction delay Abnormal inferior Q waves Abnrm T, consider ischemia, anterolateral lds Confirmed by Alvino Blood ([id number]) on 03/16/2023 12:45:04 PM  Radiology CT Head Wo Contrast  Result Date: 03/16/2023 CLINICAL DATA:  Mental status change, unknown cause. Speech disturbance. Lethargy/weakness. EXAM: CT HEAD WITHOUT CONTRAST TECHNIQUE: Contiguous axial images were obtained from the base of the skull through the vertex without intravenous contrast. RADIATION DOSE REDUCTION: This exam was performed according to the departmental dose-optimization program which includes automated exposure control, adjustment of the mA and/or kV according to patient size and/or use of iterative reconstruction technique. COMPARISON:  Head CT 09/27/2021 FINDINGS: Brain: There is no evidence of an acute infarct, intracranial hemorrhage, mass, midline shift, or extra-axial fluid collection. The ventricles and sulci are normal for age. Vascular: Calcified atherosclerosis at the skull base. Unchanged small calcification posteriorly in the right sylvian fissure, possibly a chronically calcified embolus within a distal right MCA branch vessel. Skull: No acute fracture or suspicious osseous lesion. Sinuses/Orbits: Partially visualized mild mucosal thickening in the maxillary sinuses. Clear mastoid air cells. Unremarkable orbits. Other: None. IMPRESSION: No evidence of acute intracranial abnormality. Electronically Signed   By: Sebastian Ache M.D.   On: 03/16/2023 14:27   DG Chest Portable 1 View  Result  Date: 03/16/2023 CLINICAL DATA:  Shortness of breath. EXAM: PORTABLE CHEST 1 VIEW COMPARISON:  Radiographs 11/05/2022 and 09/08/2022.  CT 09/09/2022. FINDINGS: 1039 hours. The left lung base is partially excluded from this portable examination. Allowing for this, the heart size and mediastinal contours are stable with cardiomegaly, aortic atherosclerosis and stable widening of the superior mediastinum. Left IJ hemodialysis catheter extends to the mid right atrium, stable. Lower lung volumes with new patchy left greater than right basilar airspace opacities. No large pleural effusion or pneumothorax identified. The bones appear unchanged. IMPRESSION: Lower lung volumes with new patchy left greater than right basilar airspace opacities, likely atelectasis. Left lower lobe pneumonia not excluded. Radiographic follow-up recommended. Electronically Signed   By: Carey Bullocks M.D.   On: 03/16/2023 10:56    Procedures Procedures    Medications Ordered in ED Medications  sodium zirconium cyclosilicate (LOKELMA) packet 10 g (10 g Oral Given 03/16/23 1436)  dextrose 5 % and 0.45 % NaCl infusion ( Intravenous New Bag/Given 03/16/23 1502)  dextrose 50 % solution 50 mL (50 mLs Intravenous Given 03/16/23 1034)  cefTRIAXone (ROCEPHIN) 1 g in sodium chloride 0.9 % 100 mL IVPB (0 g Intravenous Stopped 03/16/23 1314)  azithromycin (ZITHROMAX) 500 mg in sodium chloride 0.9 % 250 mL IVPB (0 mg Intravenous Stopped 03/16/23 1433)  dextrose 50 % solution 50 mL (50 mLs Intravenous Given 03/16/23 1241)    ED Course/ Medical Decision Making/ A&P                                 Medical Decision Making Amount and/or Complexity of Data Reviewed Labs: ordered. Radiology: ordered.  Risk Prescription drug management.   This patient is a 52 y.o. female who presents to the ED for concern of weakness, ams, this involves an extensive number of treatment options, and is a complaint that carries with it a high risk of  complications and morbidity. The emergent differential diagnosis prior to evaluation includes, but is not limited to,  CVA, spinal cord injury, ACS, arrhythmia, syncope, orthostatic hypotension, sepsis, hypoglycemia, hypoxia, electrolyte disturbance, endocrine disorder, anemia, environmental exposure, polypharmacy, CVA, seizure, hypotension, sepsis, hypoglycemia, hypoxic encephalopathy, metabolic encephalopathy, polypharmacy, substance abuse, developing dementia or alzheimers, meningitis, encephalitis, hypertensive emergency, other systemic infection, acute alcohol intoxication, acute alcohol or other drug withdrawal or psychiatric manifestation vs other . This is not an exhaustive differential.   Past Medical History / Co-morbidities / Social History: ESRD on dialysis, bilateral AKA, morbid obesity, previous NSTEMI, hyperlipidemia, hypertensive urgency, diabetes, CAD  Additional history: Chart reviewed. Pertinent results include: Reviewed lab work, imaging from recent emergency department visits, she was admitted in August of this year for generalized weakness, additionally reviewed outpatient cardiology visits for heart failure, severe mitral regurgitation  Physical Exam: Physical exam performed. The pertinent findings include:  She is obese. She is ill-appearing.     Comments: Chronically ill-appearing but nontoxic, nonseptic  Bilateral AKAs    Lab Tests: I ordered, and personally interpreted labs.  The pertinent results include: CMP notable for hyperkalemia, testing 5.5, she does have a new anion gap which is likely secondary to possible starvation ketosis, versus other dehydration in context of multiple episodes of persistent hypoglycemia, no evidence of acute infectious process, no white count.  Anemia not significantly changed from baseline, hemoglobin 10.8 today.  BUN and creatinine are elevated in setting of ESRD on dialysis, she did miss her dialysis this morning.   Imaging Studies: I  ordered imaging studies including CT head without contrast, plain film chest x-ray.  I independently visualized and interpreted imaging which showed suspicion for a left-sided pneumonia, additionally with no evidence of acute intracranial injury. I agree with the radiologist interpretation.   Cardiac Monitoring:  The patient was maintained on a cardiac monitor.  My attending physician Dr. Suezanne Jacquet viewed and interpreted the cardiac monitored which showed an underlying rhythm of: Noted anterior T waves, Q waves, but EKG not significantly changed from recent baseline. I agree with this interpretation.   Medications: I ordered medication including multiple amps of D50 needed for recurrent hypoglycemia, initially arrived at 67, after sugar replacement glucose decreased to 25, improved to greater than 100 and has been downtrending on repeat, glucose of 81 at this time.  Additionally given her cough, questionable left-sided infiltrate treated with Rocephin, azithromycin for possible early CAP   Consultations Obtained: I requested consultation with the hospitalist, spoke with Dr. Maisie Fus,  and discussed lab and imaging findings as well as pertinent plan - they recommend: Admission for weakness, hypoglycemia, hyperkalemia, and community-acquired pneumonia   Disposition: After consideration of the diagnostic results and the patients response to treatment, I feel that patient would benefit from admission as discussed above.   I discussed this case with my attending physician Dr. Suezanne Jacquet who cosigned this note including patient's presenting symptoms, physical exam, and planned diagnostics and interventions. Attending physician stated agreement with plan or made changes to plan which were implemented.    Final Clinical Impression(s) / ED Diagnoses Final diagnoses:  Weakness  Hypoglycemia  Pneumonia of left lower lobe due to infectious organism    Rx / DC Orders ED Discharge Orders     None          [place] 03/16/23 1521    Lonell Grandchild, MD 03/17/23 [phone]

## 2023-03-16 NOTE — ED Notes (Signed)
2nd green top sent to lab at this time

## 2023-03-16 NOTE — ED Triage Notes (Addendum)
Pt arrives via ems from [facility] for the c/o ams. Per ems pt was lethargic, weak. Pt states this morning pt was unable to speak. [facility] staff noticed this change at 8am. M,W,F dialysis.  A and o x 4. Pt believes she was given to much gabapentin and hydrocodone last night. BKA. Wounds noted to left lower abdomen.  VS PER EMS 82p Unable to obtain bp 3/L [place] 02 99%  BG 104 20r

## 2023-03-16 NOTE — Progress Notes (Signed)
Hypoglycemia:  Patient nurse reported that patient's blood glucose is persistently low 66 which improved to 81 after giving serum oral oranges then again dropped to 63 even though patient is eating. -Currently on D5 NS 40 cc/h - Order dextrose oral gel as needed blood glucose drop below 70.

## 2023-03-16 NOTE — ED Notes (Signed)
Patient continuously nodding off in room. BG checked, 63. Provider to be made aware.

## 2023-03-16 NOTE — H&P (Signed)
History and Physical    Brennen Bringer ZOX:[id number] DOB: [birth year] DOA: 03/16/2023  PCP: Default, Provider, MD  Patient coming from: facility ( [facility])  I have personally briefly reviewed patient's old medical records in [facility]  Chief Complaint: hypoglycemia  HPI: Courtney Gray is a 52 y.o. 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 Per patient she's started to feel ill last evening. She notes she felt weak and tremulous. She states this am she has progression of symptoms on waking she fell as if she was unable to speak. Patient states she feel she may have had too much of her pain medication last evening.  However currently she is more alert but feels weak and lethargic.  She notes no fever// n/v/d/  or abdominal pain. She does endorse history of  chills,  and chronic sob, as well as finger infection for which she was on antibiotics for but notes she top taking abx due to loose stools.  ED Course:  Afeb,  BP 131/115, hr 67    100% on 3L  Respiratory panel neg  Na 135, K 5.5( status post Kayexylate), cr 15., glu  25-66 Cxr: Lower lung volumes with new patchy left greater than right basilar airspace opacities, likely atelectasis. Left lower lobe pneumonia not excluded. Radiographic follow-up recommended. Wbc 10.2, hgb 10.8 Ct head NAD EKG:nsr nonspecific t waves lateral leads Tx ctx /azithromycin  Patient of note in ed had refractory glucose with dip as low as 25 and repeated dips despite tx to 67 's patient was started on low rate 10  Of note is not on any treatment for diabetes  Review of Systems: As per HPI otherwise 10 point review of systems negative.   Past Medical History:  Diagnosis Date   Arthritis    Bacteremia    Calciphylaxis    Chronic HFrEF (heart failure with reduced  ejection fraction) (HCC)    Diabetes mellitus without complication (HCC)    Dialysis complication, subsequent encounter    ESRD (end stage renal disease) (HCC)    Gout    High cholesterol    Hypertension    Hypertensive emergency    Ischemic foot pain at rest 08/03/2021   Renal disorder    Right atrial mass    Severe mitral regurgitation     Past Surgical History:  Procedure Laterality Date   ABDOMINAL AORTOGRAM W/LOWER EXTREMITY N/A 08/07/2021   Procedure: ABDOMINAL AORTOGRAM W/LOWER EXTREMITY;  Surgeon: Victorino Sparrow, MD;  Location: [facility] INVASIVE CV LAB;  Service: Cardiovascular;  Laterality: N/A;   AMPUTATION Right 08/14/2021   Procedure: AMPUTATION ABOVE KNEE;  Surgeon: Cephus Shelling, MD;  Location: [facility] OR;  Service: Vascular;  Laterality: Right;   AMPUTATION Left 09/23/2021   Procedure: AMPUTATION MIDDLE FINGER;  Surgeon: Marlyne Beards, MD;  Location: [facility] OR;  Service: Orthopedics;  Laterality: Left;   AMPUTATION Left 10/09/2021   Procedure: LEFT ABOVE KNEE AMPUTATION;  Surgeon: Leonie Douglas, MD;  Location: [facility] OR;  Service: Vascular;  Laterality: Left;   AV FISTULA PLACEMENT     x2, unsuccessful.    BUBBLE STUDY  09/27/2021   Procedure: BUBBLE STUDY;  Surgeon: Lewayne Bunting, MD;  Location: [facility] ENDOSCOPY;  Service: Cardiovascular;;   IR FLUORO GUIDE CV LINE LEFT  12/14/2020   IR FLUORO GUIDE CV LINE  LEFT  02/20/2021   IR FLUORO GUIDE CV LINE LEFT  09/11/2021   IR FLUORO GUIDE CV LINE LEFT  10/18/2021   IR FLUORO GUIDE CV LINE LEFT  11/26/2021   IR FLUORO GUIDE CV LINE LEFT  11/28/2021   IR FLUORO GUIDE CV LINE LEFT  02/04/2022   IR FLUORO GUIDE CV LINE LEFT  03/07/2022   IR FLUORO GUIDE CV LINE RIGHT  08/28/2020   IR FLUORO GUIDE CV LINE RIGHT  05/13/2021   IR FLUORO GUIDE CV LINE RIGHT  05/24/2021   IR INTRAVASCULAR ULTRASOUND NON CORONARY  03/07/2022   IR PERC TUN PERIT CATH WO PORT S&I /IMAG  09/06/2020   IR PTA VENOUS EXCEPT DIALYSIS CIRCUIT  02/20/2021   IR PTA VENOUS  EXCEPT DIALYSIS CIRCUIT  05/13/2021   IR PTA VENOUS EXCEPT DIALYSIS CIRCUIT  11/28/2021   IR PTA VENOUS EXCEPT DIALYSIS CIRCUIT  03/07/2022   IR REMOVAL TUN CV CATH W/O FL  05/24/2021   IR REMOVAL TUN CV CATH W/O FL  10/15/2021   IR US GUIDE VASC ACCESS LEFT  09/06/2020   IR US GUIDE VASC ACCESS LEFT  09/11/2021   IR US GUIDE VASC ACCESS LEFT  10/18/2021   IR US GUIDE VASC ACCESS RIGHT  05/24/2021   IR US GUIDE VASC ACCESS RIGHT  09/10/2021   IR VENO/JUGULAR RIGHT  09/10/2021   IR VENOCAVAGRAM SVC  02/20/2021   IR VENOCAVAGRAM SVC  11/26/2021   IR VENOCAVAGRAM SVC  03/07/2022   TEE WITHOUT CARDIOVERSION N/A 09/27/2021   Procedure: TRANSESOPHAGEAL ECHOCARDIOGRAM (TEE);  Surgeon: Lewayne Bunting, MD;  Location: [facility] ENDOSCOPY;  Service: Cardiovascular;  Laterality: N/A;   TEE WITHOUT CARDIOVERSION N/A 11/11/2022   Procedure: TRANSESOPHAGEAL ECHOCARDIOGRAM;  Surgeon: Quintella Reichert, MD;  Location: [facility] INVASIVE CV LAB;  Service: Cardiovascular;  Laterality: N/A;   UPPER EXTREMITY VENOGRAPHY Right 12/04/2021   Procedure: UPPER EXTREMITY VENOGRAPHY;  Surgeon: Victorino Sparrow, MD;  Location: [facility] INVASIVE CV LAB;  Service: Cardiovascular;  Laterality: Right;     reports that she has never smoked. She has never used smokeless tobacco. She reports that she does not drink alcohol and does not use drugs.  Allergies  Allergen Reactions   Benzonatate Other (See Comments)    Hallucinations   Hydralazine Other (See Comments)    Hallucinations   Amoxicillin Itching and Nausea And Vomiting   Clonidine Other (See Comments)    Altered mental state, insomnia    Chlorhexidine Itching    Patient reports it is the CHG baths that cause her itching not the CHG used for caring for her IV/HD access.     Morphine Itching    Family History  Problem Relation Age of Onset   Diabetes Mother    Diabetes Father     Prior to Admission medications   Medication Sig Start Date End Date Taking? Authorizing Provider  aspirin EC 81  MG tablet Take 1 tablet (81 mg total) by mouth daily. Swallow whole. 05/16/22  Yes [place], Eric J, DO  atorvastatin (LIPITOR) 10 MG tablet Take 1 tablet (10 mg total) by mouth daily. 11/28/20  Yes Emokpae, Courage, MD  cephALEXin (KEFLEX) 500 MG capsule Take 1 capsule (500 mg total) by mouth 3 (three) times daily. 03/07/23  Yes Arby Barrette, MD  cyclobenzaprine (FLEXERIL) 10 MG tablet Take 1 tablet (10 mg total) by mouth 2 (two) times daily as needed for muscle spasms. 09/16/22  Yes Netta Corrigan, PA-C  Darbepoetin Alfa (ARANESP) 200  MCG/0.4ML SOSY injection Inject 200 mcg into the skin once a week. Every Tuesday   Yes [provider]  dicyclomine (BENTYL) 10 MG capsule Take 10 mg by mouth in the morning, at noon, and at bedtime. 11/04/22  Yes [provider]  docusate sodium (COLACE) 100 MG capsule Take 100 mg by mouth 2 (two) times daily.   Yes [provider]  famotidine (PEPCID) 20 MG tablet Take 1 tablet (20 mg total) by mouth daily. 05/16/22  Yes [place], Eric J, DO  fentaNYL (DURAGESIC) 25 MCG/HR Place 1 patch onto the skin every 3 (three) days.   Yes [provider]  gabapentin (NEURONTIN) 300 MG capsule Take 300 mg by mouth 3 (three) times daily.   Yes [provider]  guaiFENesin (ROBITUSSIN) 100 MG/5ML liquid Take 100 mg by mouth every 4 (four) hours as needed for cough.   Yes [provider]  HYDROcodone-acetaminophen (NORCO/VICODIN) 5-325 MG tablet Take 1 tablet by mouth in the morning, at noon, in the evening, and at bedtime.   Yes [provider]  levothyroxine (SYNTHROID) 100 MCG tablet Take 100 mcg by mouth daily.   Yes [provider]  lidocaine (LIDODERM) 5 % Place 1 patch onto the skin daily. Remove & Discard patch within 12 hours or as directed by MD   Yes [provider]  lidocaine (XYLOCAINE) 5 % ointment Apply 1 Application topically See admin instructions. 1 application every 2 hours as needed for  pain   Yes [provider]  midodrine (PROAMATINE) 10 MG tablet Take 1 tablet (10 mg total) by mouth every Monday, Wednesday, and Friday at 8 PM. Before hemodialysis Patient taking differently: Take 10 mg by mouth See admin instructions. Take 10 mg by mouth every Monday, Wednesday, and Friday before hemodialysis 05/16/22  Yes [place], Alvira Philips, DO  nutrition supplement, JUVEN, (JUVEN) PACK Take 1 packet by mouth 2 (two) times daily between meals.   Yes [provider]  oxyCODONE-acetaminophen (PERCOCET/ROXICET) 5-325 MG tablet Take 1 tablet by mouth every 6 (six) hours as needed for moderate pain. 10/30/22  Yes [provider]  OXYGEN Inhale 4 L into the lungs See admin instructions. Oxygen at 4L hr via nasal cannula and will use portable oxygen machine when up in wheelchair. 10/28/21  Yes [provider]  pantoprazole (PROTONIX) 40 MG tablet Take 1 tablet (40 mg total) by mouth daily. 11/29/20  Yes Shon Hale, MD  Patiromer Sorbitex Calcium (VELTASSA) 16.8 g PACK Take 16.8 g by mouth daily.   Yes [provider]  pregabalin (LYRICA) 75 MG capsule Take 75 mg by mouth 3 (three) times daily.   Yes [provider]  senna (SENOKOT) 8.6 MG TABS tablet Take 2 tablets (17.2 mg total) by mouth daily. 11/13/22  Yes Monna Fam, MD  simethicone (MYLICON) 80 MG chewable tablet Chew 80 mg by mouth every 6 (six) hours as needed for flatulence.   Yes [provider]  sucroferric oxyhydroxide (VELPHORO) 500 MG chewable tablet Chew 500 mg by mouth 3 (three) times daily with meals.   Yes [provider]    Physical Exam: Vitals:   03/16/23 1320 03/16/23 1431 03/16/23 1435 03/16/23 1818  BP: (!) 103/47  (!) 121/94 (!) 116/101  Pulse: 74  86 (!) 51  Resp: 15  18 20   Temp:  98 F (36.7 C)  98.9 F (37.2 C)  TempSrc:    Oral  SpO2: 95%  95% 95%  Weight:  Height:        Constitutional: NAD, calm, comfortable.lethargic  Eyes: PERRL,  lids and conjunctivae normal ENMT: Mucous membranes are moist. Posterior pharynx clear of any exudate or lesions.Normal dentition.  Neck: normal, supple, no masses, no thyromegaly Respiratory: clear to auscultation bilaterally, no wheezing, no crackles. Normal respiratory effort. No accessory muscle use.  Cardiovascular: Regular rate and rhythm, no murmurs / rubs / gallops. No extremity edema. 2+ pedal pulses. Abdomen: no tenderness, no masses palpated. No hepatosplenomegaly. Bowel sounds positive.  Musculoskeletal: no clubbing / cyanosis. B/l AKA Skin: no rashes, +ulcers. No induration Neurologic: CN 2-12 grossly intact. Sensation intact, MAE x 4 Psychiatric: Normal judgment and insight. Alert and oriented x 3. Normal mood.    Labs on Admission: I have personally reviewed following labs and imaging studies  CBC: Recent Labs  Lab 03/16/23 1038 03/16/23 1055  WBC  --  10.2  HGB 12.6 10.8*  HCT 37.0 35.7*  MCV  --  98.1  PLT  --  229   Basic Metabolic Panel: Recent Labs  Lab 03/16/23 1038 03/16/23 1140  NA 135 135  K 5.5* 5.5*  CL 103 95*  CO2  --  19*  GLUCOSE 66* 61*  BUN 56* 62*  CREATININE 15.00* 13.59*  CALCIUM  --  9.7   GFR: Estimated Creatinine Clearance: 4.2 mL/min (A) (by C-G formula based on SCr of 13.59 mg/dL (H)). Liver Function Tests: Recent Labs  Lab 03/16/23 1140  AST 20  ALT 17  ALKPHOS 74  BILITOT 1.0  PROT 7.4  ALBUMIN 3.2*   No results for input(s): "LIPASE", "AMYLASE" in the last 168 hours. No results for input(s): "AMMONIA" in the last 168 hours. Coagulation Profile: No results for input(s): "INR", "PROTIME" in the last 168 hours. Cardiac Enzymes: No results for input(s): "CKTOTAL", "CKMB", "CKMBINDEX", "TROPONINI" in the last 168 hours. BNP (last 3 results) No results for input(s): "PROBNP" in the last 8760 hours. HbA1C: No results for input(s): "HGBA1C" in the last 72 hours. CBG: Recent Labs  Lab 03/16/23 1427 03/16/23 1535  03/16/23 1640 03/16/23 1837 03/16/23 1934  GLUCAP 81 127* 74 66* 81   Lipid Profile: No results for input(s): "CHOL", "HDL", "LDLCALC", "TRIG", "CHOLHDL", "LDLDIRECT" in the last 72 hours. Thyroid Function Tests: No results for input(s): "TSH", "T4TOTAL", "FREET4", "T3FREE", "THYROIDAB" in the last 72 hours. Anemia Panel: No results for input(s): "VITAMINB12", "FOLATE", "FERRITIN", "TIBC", "IRON", "RETICCTPCT" in the last 72 hours. Urine analysis: No results found for: "COLORURINE", "APPEARANCEUR", "LABSPEC", "PHURINE", "GLUCOSEU", "HGBUR", "BILIRUBINUR", "KETONESUR", "PROTEINUR", "UROBILINOGEN", "NITRITE", "LEUKOCYTESUR"  Radiological Exams on Admission: CT Head Wo Contrast  Result Date: 03/16/2023 CLINICAL DATA:  Mental status change, unknown cause. Speech disturbance. Lethargy/weakness. EXAM: CT HEAD WITHOUT CONTRAST TECHNIQUE: Contiguous axial images were obtained from the base of the skull through the vertex without intravenous contrast. RADIATION DOSE REDUCTION: This exam was performed according to the departmental dose-optimization program which includes automated exposure control, adjustment of the mA and/or kV according to patient size and/or use of iterative reconstruction technique. COMPARISON:  Head CT 09/27/2021 FINDINGS: Brain: There is no evidence of an acute infarct, intracranial hemorrhage, mass, midline shift, or extra-axial fluid collection. The ventricles and sulci are normal for age. Vascular: Calcified atherosclerosis at the skull base. Unchanged small calcification posteriorly in the right sylvian fissure, possibly a chronically calcified embolus within a distal right MCA branch vessel. Skull: No acute fracture or suspicious osseous lesion. Sinuses/Orbits: Partially visualized mild mucosal thickening in the maxillary sinuses. Clear mastoid  air cells. Unremarkable orbits. Other: None. IMPRESSION: No evidence of acute intracranial abnormality. Electronically Signed   By: Sebastian Ache M.D.   On: 03/16/2023 14:27   DG Chest Portable 1 View  Result Date: 03/16/2023 CLINICAL DATA:  Shortness of breath. EXAM: PORTABLE CHEST 1 VIEW COMPARISON:  Radiographs 11/05/2022 and 09/08/2022.  CT 09/09/2022. FINDINGS: 1039 hours. The left lung base is partially excluded from this portable examination. Allowing for this, the heart size and mediastinal contours are stable with cardiomegaly, aortic atherosclerosis and stable widening of the superior mediastinum. Left IJ hemodialysis catheter extends to the mid right atrium, stable. Lower lung volumes with new patchy left greater than right basilar airspace opacities. No large pleural effusion or pneumothorax identified. The bones appear unchanged. IMPRESSION: Lower lung volumes with new patchy left greater than right basilar airspace opacities, likely atelectasis. Left lower lobe pneumonia not excluded. Radiographic follow-up recommended. Electronically Signed   By: Carey Bullocks M.D.   On: 03/16/2023 10:56    EKG: Independently reviewed. See above   Assessment/Plan  Severe hypoglycemia -on low dose D10 -monitor  q4h fs  -wean D10 as able  -will also add glucagon  -of note patient is not on treatment for DMII as outpatient   Probable CAP -infiltrate on cxr - continue with ctx and azithromycin  - f/u on culture data , urine AG   Acute metabolic encephalopathy -related to severe hypoglycemia -to be complete will check ammonia  ESRD MWF -nephrology consulted will follow in am for HD  CHFpef -current compensated  - fluid managed at HD  Chronic Anemia -stable h/h  -followed by nephrology   Calciphylaxis  -wound care to follow  Hypothyroidism  -check tsh -resume synthroid  HTN ,  Uncontrolled currently -prn  -hx of hypotension on midodrine -hold midodrine for now  Mild hyperkalemia -s/p lokelma -renal to follow in am    PAD s/p B/l BKA  GERD -ppi DVT prophylaxis: heparin Code Status: full/ as discussed  per patient wishes in event of cardiac arrest  Family Communication:   Courtney Gray,Courtney Gray (Daughter) [phone] ([place])   Disposition Plan: patient  expected to be admitted greater than 2 midnights  Consults called: nephrology  Admission status: progressive   Lurline Del MD [facility]   If 7PM-7AM, please contact night-coverage [email or web address] Password [email or web address]  03/16/2023, 9:05 PM

## 2023-03-17 DIAGNOSIS — E875 Hyperkalemia: Secondary | ICD-10-CM

## 2023-03-17 DIAGNOSIS — I950 Idiopathic hypotension: Secondary | ICD-10-CM | POA: Diagnosis not present

## 2023-03-17 DIAGNOSIS — I5189 Other ill-defined heart diseases: Secondary | ICD-10-CM

## 2023-03-17 DIAGNOSIS — G9341 Metabolic encephalopathy: Secondary | ICD-10-CM

## 2023-03-17 LAB — CBC
HCT: 33.1 % — ABNORMAL LOW (ref 36.0–46.0)
Hemoglobin: 10.1 g/dL — ABNORMAL LOW (ref 12.0–15.0)
MCH: 30.1 pg (ref 26.0–34.0)
MCHC: 30.5 g/dL (ref 30.0–36.0)
MCV: 98.5 fL (ref 80.0–100.0)
Platelets: 226 10*3/uL (ref 150–400)
RBC: 3.36 MIL/uL — ABNORMAL LOW (ref 3.87–5.11)
RDW: 18.6 % — ABNORMAL HIGH (ref 11.5–15.5)
WBC: 15.3 10*3/uL — ABNORMAL HIGH (ref 4.0–10.5)
nRBC: 0 % (ref 0.0–0.2)

## 2023-03-17 LAB — COMPREHENSIVE METABOLIC PANEL
ALT: 15 U/L (ref 0–44)
AST: 16 U/L (ref 15–41)
Albumin: 3.4 g/dL — ABNORMAL LOW (ref 3.5–5.0)
Alkaline Phosphatase: 63 U/L (ref 38–126)
Anion gap: 17 — ABNORMAL HIGH (ref 5–15)
BUN: 65 mg/dL — ABNORMAL HIGH (ref 6–20)
CO2: 20 mmol/L — ABNORMAL LOW (ref 22–32)
Calcium: 9.4 mg/dL (ref 8.9–10.3)
Chloride: 96 mmol/L — ABNORMAL LOW (ref 98–111)
Creatinine, Ser: 14.09 mg/dL — ABNORMAL HIGH (ref 0.44–1.00)
GFR, Estimated: 3 mL/min — ABNORMAL LOW (ref >60)
Glucose, Bld: 119 mg/dL — ABNORMAL HIGH (ref 70–99)
Potassium: 6 mmol/L — ABNORMAL HIGH (ref 3.5–5.1)
Sodium: 133 mmol/L — ABNORMAL LOW (ref 135–145)
Total Bilirubin: 0.7 mg/dL (ref <1.2)
Total Protein: 7.2 g/dL (ref 6.5–8.1)

## 2023-03-17 LAB — ACTH STIMULATION, 3 TIME POINTS
Cortisol, 30 Min: 19.2 ug/dL
Cortisol, 60 Min: 23.2 ug/dL
Cortisol, Base: 14.8 ug/dL

## 2023-03-17 LAB — CBG MONITORING, ED
Glucose-Capillary: 106 mg/dL — ABNORMAL HIGH (ref 70–99)
Glucose-Capillary: 80 mg/dL (ref 70–99)
Glucose-Capillary: 81 mg/dL (ref 70–99)
Glucose-Capillary: 83 mg/dL (ref 70–99)
Glucose-Capillary: 93 mg/dL (ref 70–99)

## 2023-03-17 LAB — PROCALCITONIN: Procalcitonin: 0.39 ng/mL

## 2023-03-17 LAB — CORTISOL-AM, BLOOD: Cortisol - AM: 17.6 ug/dL (ref 6.7–22.6)

## 2023-03-17 LAB — LACTIC ACID, PLASMA
Lactic Acid, Venous: 1.2 mmol/L (ref 0.5–1.9)
Lactic Acid, Venous: 1.2 mmol/L (ref 0.5–1.9)

## 2023-03-17 LAB — HEPATITIS B SURFACE ANTIGEN: Hepatitis B Surface Ag: NONREACTIVE

## 2023-03-17 LAB — MRSA NEXT GEN BY PCR, NASAL: MRSA by PCR Next Gen: NOT DETECTED

## 2023-03-17 LAB — TSH: TSH: 1.932 u[IU]/mL (ref 0.350–4.500)

## 2023-03-17 MED ORDER — MIDODRINE HCL 5 MG PO TABS: 10.0000 mg | ORAL_TABLET | Freq: Three times a day (TID) | ORAL | Status: DC

## 2023-03-17 MED ORDER — MIDODRINE HCL 5 MG PO TABS
10.0000 mg | ORAL_TABLET | ORAL | Status: DC
  Administered 2023-03-24: 10 mg via ORAL
  Filled 2023-03-17: qty 2

## 2023-03-17 MED ORDER — HYDROCORTISONE SOD SUC (PF) 100 MG IJ SOLR: 100.0000 mg | Freq: Two times a day (BID) | INTRAMUSCULAR | Status: DC

## 2023-03-17 MED ORDER — PENTAFLUOROPROP-TETRAFLUOROETH EX AERO: 1.0000 | INHALATION_SPRAY | CUTANEOUS | Status: DC | PRN

## 2023-03-17 MED ORDER — ALTEPLASE 2 MG IJ SOLR: 2.0000 mg | Freq: Once | INTRAMUSCULAR | Status: DC | PRN

## 2023-03-17 MED ORDER — HEPARIN SODIUM (PORCINE) 1000 UNIT/ML DIALYSIS
3000.0000 [IU] | Freq: Once | INTRAMUSCULAR | Status: AC
  Administered 2023-03-17: 3000 [IU] via INTRAVENOUS_CENTRAL
  Filled 2023-03-17: qty 3

## 2023-03-17 MED ORDER — IPRATROPIUM-ALBUTEROL 0.5-2.5 (3) MG/3ML IN SOLN: 3.0000 mL | RESPIRATORY_TRACT | Status: DC | PRN

## 2023-03-17 MED ORDER — ALBUMIN HUMAN 25 % IV SOLN
50.0000 g | INTRAVENOUS | Status: AC
  Administered 2023-03-17: 25 g via INTRAVENOUS
  Filled 2023-03-17: qty 200

## 2023-03-17 MED ORDER — LIDOCAINE HCL (PF) 1 % IJ SOLN: 5.0000 mL | INTRAMUSCULAR | Status: DC | PRN

## 2023-03-17 MED ORDER — ONDANSETRON HCL 4 MG/2ML IJ SOLN
4.0000 mg | Freq: Four times a day (QID) | INTRAMUSCULAR | Status: DC | PRN
  Filled 2023-03-17: qty 2

## 2023-03-17 MED ORDER — TRAZODONE HCL 50 MG PO TABS
50.0000 mg | ORAL_TABLET | Freq: Every evening | ORAL | Status: DC | PRN
  Administered 2023-03-24: 50 mg via ORAL
  Filled 2023-03-17: qty 1

## 2023-03-17 MED ORDER — MIDODRINE HCL 5 MG PO TABS
10.0000 mg | ORAL_TABLET | ORAL | Status: AC
  Administered 2023-03-17: 10 mg via ORAL
  Filled 2023-03-17: qty 2

## 2023-03-17 MED ORDER — HYDROCORTISONE SOD SUC (PF) 100 MG IJ SOLR: 50.0000 mg | Freq: Four times a day (QID) | INTRAMUSCULAR | Status: DC

## 2023-03-17 MED ORDER — DEXTROSE-SODIUM CHLORIDE 10-0.45 % IV SOLN: INTRAVENOUS | Status: DC

## 2023-03-17 MED ORDER — HYDROCORTISONE SOD SUC (PF) 100 MG IJ SOLR: 100.0000 mg | Freq: Once | INTRAMUSCULAR | Status: DC

## 2023-03-17 MED ORDER — ANTICOAGULANT SODIUM CITRATE 4% (200MG/5ML) IV SOLN
5.0000 mL | Status: DC | PRN
  Filled 2023-03-17: qty 5

## 2023-03-17 MED ORDER — HYDROCORTISONE SOD SUC (PF) 100 MG IJ SOLR
100.0000 mg | Freq: Once | INTRAMUSCULAR | Status: AC
  Administered 2023-03-17: 100 mg via INTRAVENOUS
  Filled 2023-03-17: qty 2

## 2023-03-17 MED ORDER — GUAIFENESIN 100 MG/5ML PO LIQD: 5.0000 mL | ORAL | Status: DC | PRN

## 2023-03-17 MED ORDER — HEPARIN SODIUM (PORCINE) 1000 UNIT/ML IJ SOLN
4200.0000 [IU] | Freq: Once | INTRAMUSCULAR | Status: DC
  Filled 2023-03-17: qty 5
  Filled 2023-03-17: qty 4.2

## 2023-03-17 MED ORDER — PANTOPRAZOLE SODIUM 40 MG IV SOLR: 40.0000 mg | INTRAVENOUS | Status: DC

## 2023-03-17 MED ORDER — PANTOPRAZOLE SODIUM 40 MG PO TBEC
40.0000 mg | DELAYED_RELEASE_TABLET | Freq: Every day | ORAL | Status: DC
  Administered 2023-03-17 – 2023-03-24 (×8): 40 mg via ORAL
  Filled 2023-03-17 (×8): qty 1

## 2023-03-17 MED ORDER — SODIUM CHLORIDE 0.9 % IV SOLN
1.0000 g | INTRAVENOUS | Status: AC
  Administered 2023-03-17 – 2023-03-21 (×5): 1 g via INTRAVENOUS
  Filled 2023-03-17 (×6): qty 10

## 2023-03-17 MED ORDER — CALCIUM GLUCONATE-NACL 1-0.675 GM/50ML-% IV SOLN
1.0000 g | INTRAVENOUS | Status: AC
  Administered 2023-03-17: 1000 mg via INTRAVENOUS
  Filled 2023-03-17: qty 50

## 2023-03-17 MED ORDER — HEPARIN SODIUM (PORCINE) 1000 UNIT/ML DIALYSIS: 1000.0000 [IU] | INTRAMUSCULAR | Status: DC | PRN

## 2023-03-17 MED ORDER — MIDODRINE HCL 5 MG PO TABS
5.0000 mg | ORAL_TABLET | Freq: Three times a day (TID) | ORAL | Status: DC
  Administered 2023-03-17 – 2023-03-19 (×6): 5 mg via ORAL
  Filled 2023-03-17 (×7): qty 1

## 2023-03-17 MED ORDER — SENNOSIDES-DOCUSATE SODIUM 8.6-50 MG PO TABS
1.0000 | ORAL_TABLET | Freq: Every evening | ORAL | Status: DC | PRN
  Administered 2023-03-23: 1 via ORAL
  Filled 2023-03-17 (×2): qty 1

## 2023-03-17 MED ORDER — SODIUM ZIRCONIUM CYCLOSILICATE 10 G PO PACK: 10.0000 g | PACK | Freq: Once | ORAL | Status: DC

## 2023-03-17 MED ORDER — METOPROLOL TARTRATE 5 MG/5ML IV SOLN
5.0000 mg | INTRAVENOUS | Status: DC | PRN
Start: 2023-03-17 — End: 2023-03-25

## 2023-03-17 MED ORDER — SODIUM BICARBONATE 8.4 % IV SOLN
50.0000 meq | INTRAVENOUS | Status: AC
  Administered 2023-03-17: 50 meq via INTRAVENOUS
  Filled 2023-03-17: qty 50

## 2023-03-17 MED ORDER — COSYNTROPIN 0.25 MG IJ SOLR
0.2500 mg | INTRAMUSCULAR | Status: AC
  Administered 2023-03-17: 0.25 mg via INTRAVENOUS
  Filled 2023-03-17 (×2): qty 0.25

## 2023-03-17 MED ORDER — HYDRALAZINE HCL 20 MG/ML IJ SOLN
10.0000 mg | INTRAMUSCULAR | Status: DC | PRN
Start: 2023-03-17 — End: 2023-03-25

## 2023-03-17 MED ORDER — HEPARIN SODIUM (PORCINE) 1000 UNIT/ML DIALYSIS
2000.0000 [IU] | INTRAMUSCULAR | Status: AC | PRN
  Administered 2023-03-17: 2000 [IU] via INTRAVENOUS_CENTRAL
  Filled 2023-03-17: qty 2

## 2023-03-17 MED ORDER — MIDODRINE HCL 5 MG PO TABS: 10.0000 mg | ORAL_TABLET | ORAL | Status: DC

## 2023-03-17 MED ORDER — HYDROCORTISONE SOD SUC (PF) 100 MG IJ SOLR
50.0000 mg | Freq: Four times a day (QID) | INTRAMUSCULAR | Status: DC
  Administered 2023-03-18 (×2): 50 mg via INTRAVENOUS
  Filled 2023-03-17 (×4): qty 2

## 2023-03-17 MED ORDER — LIDOCAINE-PRILOCAINE 2.5-2.5 % EX CREA: 1.0000 | TOPICAL_CREAM | CUTANEOUS | Status: DC | PRN

## 2023-03-17 MED ORDER — SODIUM CHLORIDE 0.9 % IV BOLUS
250.0000 mL | INTRAVENOUS | Status: AC
  Administered 2023-03-17: 250 mL via INTRAVENOUS

## 2023-03-17 MED ORDER — NEPRO/CARBSTEADY PO LIQD
237.0000 mL | ORAL | Status: DC | PRN
  Filled 2023-03-17: qty 237

## 2023-03-17 MED ORDER — AZITHROMYCIN 500 MG PO TABS
500.0000 mg | ORAL_TABLET | Freq: Every day | ORAL | Status: AC
  Administered 2023-03-17 – 2023-03-21 (×5): 500 mg via ORAL
  Filled 2023-03-17: qty 1
  Filled 2023-03-17: qty 2
  Filled 2023-03-17 (×3): qty 1

## 2023-03-17 MED ORDER — SODIUM CHLORIDE 0.9 % IV SOLN: 500.0000 mg | INTRAVENOUS | Status: DC

## 2023-03-17 MED ORDER — HYDROCORTISONE SOD SUC (PF) 100 MG IJ SOLR
100.0000 mg | Freq: Two times a day (BID) | INTRAMUSCULAR | Status: DC
Start: 2023-03-17 — End: 2023-03-17

## 2023-03-17 NOTE — Progress Notes (Addendum)
Right atrial calcified mass: CT chest finding: IMPRESSION: 1. Increased faint ground-glass opacity in the upper and lower lobes, probably due to mosaicism and air trapping, less likely pneumonitis. 2. Mild bronchial thickening. 3. Cardiomegaly with heavy three-vessel calcific CAD. 4. 4.8 x 3.7 cm mass with patchy calcifications in the right atrium, unchanged in size since 09/12/2021 and probably a dialysis catheter-related partially calcified thrombus. If further evaluation is needed it would be best done with either echocardiography or cardiac MRI. 5. Calcified linear thrombus extends along the course of the hemodialysis catheter in the SVC. 6. Aortic and branch vessel atherosclerosis. Likely flow-limiting stenoses of both subclavian arteries, left common carotid artery. 7. Single slightly prominent right paratracheal space lymph node. No new or progressive adenopathy. 8. Dense bones consistent with renal osteodystrophy.  -CT scan showing incidental finding of right atrial calcified mass.  Spoke with on-call cardiology Dr. Granville Lewis.  Cardiology reviewed the previous echocardiogram and echo from 11/11/2022 which showed the calcified mass but it is not documented on the echo report but it was measured by the echo tech.  As patient's clinical symptom is not reflecting pulmonary embolism and cardiogenic shock there is no indication for any intervention regarding this right-sided calcified mass.  Given patient  takes midodrine for blood pressure support to maintain dialysis Dr. Hulan Saas stated that it would be helpful if we can do echocardiogram simultaneously during the dialysis session in the hospital.  -As there is no emergent need for cardiology consult at this time requested on-call cardiology to see the patient in the daytime. -Also found out that patient does not have any outpatient cardiology follow-up and need to establish and cardiology care for ongoing care.  Tereasa Coop, MD [facility] 03/17/2023, 1:35 AM

## 2023-03-17 NOTE — ED Notes (Addendum)
2nd bottle of albumin infusing on pump. Sleeping/ restless.

## 2023-03-17 NOTE — ED Notes (Signed)
Pt somnolent, confused. Daughter at [facility], VSS/ improved. Albumin infusing.

## 2023-03-17 NOTE — ED Notes (Signed)
To HD 

## 2023-03-17 NOTE — Progress Notes (Addendum)
PROGRESS NOTE    Courtney Gray  WUJ:[id number] DOB: Jun 09, 1970 DOA: 03/16/2023 PCP: Default, Provider, MD    Brief Narrative:  52 year old with history of HTN, ESRD on HD MWF, NSTEMI, obesity, DM2 status post bilateral AKA, PAD, chronic hypoxia, hypothyroidism comes to the ED for fatigue and change in mental status started prior to the day of admission.  Chest x-ray showed possible pneumonia versus atelectasis.  CT of the head was negative.  Started on Rocephin and azithromycin.  Patient is also had frequent hypoglycemic episodes   Assessment & Plan:  Active Problems:   Hypoglycemia   Hyperkalemia   Idiopathic hypotension    Symptomatic hypoglycemia with encephalopathy -Symptomatic recurrent hypoglycemic episodes.  Apparently raising concern for adrenal insufficiency.  Patient also has underlying infection.  Overnight provider discussed case with PCCM.  Currently stim test has been ordered but in the meantime requiring stress dose steroids.  Will slowly wean this off today. -If has hypoglycemic episode again, should correlate with BMP. -Ammonia, TSH normal.  Acute encephalopathy, metabolic toxic -Multifactorial with infection, hypoglycemia, multiple sedative medications.  Hopefully dialysis will improve things for her along with rest of the condition that are being managed. -Currently has fentanyl patch in place advised to take this off.   Community-acquired pneumonia -CT scan shows upper lobe pneumonitis raising concern for possible pneumonia.  For now we will empirically continue antibiotics.  Bronchodilators/I-S/flutter valve.  Procalcitonin 0.39.  Will order speech and swallow evaluation to ensure there is no aspiration aspect  Bilateral upper extremity darkening of fingertips - Right index fingertip dark with some ulceration.  Does not appear to be calcification.  This is likely from blood flow compromise but appears to be chronic.  Will obtain bilateral upper extremity  ultrasound arterial Dopplers.     ESRD MWF -Nephro consulted   Congestive heart failure with reduced EF, 35% -Echocardiogram done in August 2024 shows EF 35% with global hypokinesia.  Volume status will be managed with dialysis   Anemia chronic disease -Hemoglobin currently stable around 10.0.   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  TSH is normal   Essential hypertension -Initial concerns for adrenal insufficiency.  Currently on midodrine, antihypertensives on hold.   Mild hyperkalemia -Improved     PAD s/p B/l BKA -Aspirin and statin   GERD -Protonix daily  DVT prophylaxis: heparin injection 5,000 Units Start: 03/16/23 2200 Code Status: Full code Family Communication: Daughter at bedside Continue hospital stay.    Subjective: Patient quite drowsy during my visit.  Daughter is also present at bedside. Patient wakes up to verbal stimuli and carries a basic conversation but falls back asleep easily She has a fentanyl patch on her left arm   Examination:  General exam: Appears calm and comfortable, drowsy, morbidly obese Respiratory system: Clear to auscultation. Respiratory effort normal. Cardiovascular system: S1 & S2 heard, RRR. No JVD, murmurs, rubs, gallops or clicks. No pedal edema. Gastrointestinal system: Abdomen is nondistended, soft and nontender. No organomegaly or masses felt. Normal bowel sounds heard. Central nervous system: Alert and oriented. No focal neurological deficits. Extremities: Symmetric 5 x 5 power. Skin: Darkening of bilateral fingertips especially right index finger with some ulceration, chronic Psychiatry: Judgement and insight appear normal. Mood & affect appropriate.                Diet Orders (From admission, onward)     Start     Ordered   03/16/23 2137  Diet renal/carb modified with  fluid restriction Diet-HS Snack? Nothing; Fluid restriction: 1200 mL Fluid; Room service  appropriate? Yes; Fluid consistency: Thin  Diet effective now       Question Answer Comment  Diet-HS Snack? Nothing   Fluid restriction: 1200 mL Fluid   Room service appropriate? Yes   Fluid consistency: Thin      03/16/23 2138            Objective: Vitals:   03/17/23 1045 03/17/23 1100 03/17/23 1115 03/17/23 1210  BP: 101/69 (!) 104/54 112/73 (!) 113/59  Pulse:    79  Resp: 20 15 18 15   Temp:    (!) 97.3 F (36.3 C)  TempSrc:      SpO2:    100%  Weight:      Height:        Intake/Output Summary (Last 24 hours) at 03/17/2023 1254 Last data filed at 03/17/2023 1023 Gross per 24 hour  Intake 1324 ml  Output --  Net 1324 ml   Filed Weights   03/16/23 1004  Weight: 119.7 kg    Scheduled Meds:  aspirin EC  81 mg Oral Daily   atorvastatin  10 mg Oral Daily   azithromycin  500 mg Oral Daily   famotidine  20 mg Oral Daily   heparin  3,000 Units Dialysis Once in dialysis   heparin  5,000 Units Subcutaneous Q8H   hydrocortisone sod succinate (SOLU-CORTEF) inj  50 mg Intravenous Q6H   levothyroxine  100 mcg Oral Daily   midodrine  5 mg Oral TID WC   pantoprazole  40 mg Oral QHS   senna  2 tablet Oral Daily   Continuous Infusions:  anticoagulant sodium citrate     cefTRIAXone (ROCEPHIN)  IV Stopped (03/17/23 0947)    Nutritional status     Body mass index is 91.66 kg/m.  Data Reviewed:   CBC: Recent Labs  Lab 03/16/23 1038 03/16/23 1055 03/16/23 2208 03/16/23 2259  WBC  --  10.2  --  12.4*  NEUTROABS  --   --   --  9.6*  HGB 12.6 10.8* 10.9* 10.1*  HCT 37.0 35.7* 32.0* 33.9*  MCV  --  98.1  --  100.0  PLT  --  229  --  220   Basic Metabolic Panel: Recent Labs  Lab 03/16/23 1038 03/16/23 1140 03/16/23 2103 03/16/23 2208 03/16/23 2259  NA 135 135 134* 131* 133*  K 5.5* 5.5* 5.4* 5.6* 5.3*  CL 103 95* 95*  --  95*  CO2  --  19* 21*  --  20*  GLUCOSE 66* 61* 87  --  132*  BUN 56* 62* 63*  --  63*  CREATININE 15.00* 13.59* 12.88*  --   12.88*  CALCIUM  --  9.7 9.2  --  9.1   GFR: Estimated Creatinine Clearance: 4.4 mL/min (A) (by C-G formula based on SCr of 12.88 mg/dL (H)). Liver Function Tests: Recent Labs  Lab 03/16/23 1140 03/16/23 2259  AST 20 19  ALT 17 17  ALKPHOS 74 63  BILITOT 1.0 0.8  PROT 7.4 6.7  ALBUMIN 3.2* 2.9*   No results for input(s): "LIPASE", "AMYLASE" in the last 168 hours. Recent Labs  Lab 03/16/23 2259  AMMONIA 32   Coagulation Profile: No results for input(s): "INR", "PROTIME" in the last 168 hours. Cardiac Enzymes: No results for input(s): "CKTOTAL", "CKMB", "CKMBINDEX", "TROPONINI" in the last 168 hours. BNP (last 3 results) No results for input(s): "PROBNP" in the last 8760 hours. HbA1C:  Recent Labs    03/16/23 2259  HGBA1C 5.1   CBG: Recent Labs  Lab 03/17/23 0039 03/17/23 0401 03/17/23 0737 03/17/23 0959 03/17/23 1209  GLUCAP 93 80 83 106* 81   Lipid Profile: No results for input(s): "CHOL", "HDL", "LDLCALC", "TRIG", "CHOLHDL", "LDLDIRECT" in the last 72 hours. Thyroid Function Tests: Recent Labs    03/16/23 2259  TSH 1.932   Anemia Panel: No results for input(s): "VITAMINB12", "FOLATE", "FERRITIN", "TIBC", "IRON", "RETICCTPCT" in the last 72 hours. Sepsis Labs: Recent Labs  Lab 03/16/23 1038 03/16/23 2259 03/17/23 0040 03/17/23 0153  PROCALCITON  --  0.39  --   --   LATICACIDVEN 1.3  --  1.2 1.2    Recent Results (from the past 240 hour(s))  Resp panel by RT-PCR (RSV, Flu A&B, Covid) Anterior Nasal Swab     Status: None   Collection Time: 03/16/23 10:23 AM   Specimen: Anterior Nasal Swab  Result Value Ref Range Status   SARS Coronavirus 2 by RT PCR NEGATIVE NEGATIVE Final   Influenza A by PCR NEGATIVE NEGATIVE Final   Influenza B by PCR NEGATIVE NEGATIVE Final    Comment: (NOTE) The Xpert Xpress SARS-CoV-2/FLU/RSV plus assay is intended as an aid in the diagnosis of influenza from Nasopharyngeal swab specimens and should not be used as a sole  basis for treatment. Nasal washings and aspirates are unacceptable for Xpert Xpress SARS-CoV-2/FLU/RSV testing.  Fact Sheet for Patients: BloggerCourse.com  Fact Sheet for Healthcare Providers: SeriousBroker.it  This test is not yet approved or cleared by the [place] FDA and has been authorized for detection and/or diagnosis of SARS-CoV-2 by FDA under an Emergency Use Authorization (EUA). This EUA will remain in effect (meaning this test can be used) for the duration of the COVID-19 declaration under Section 564(b)(1) of the Act, 21 U.S.C. section 360bbb-3(b)(1), unless the authorization is terminated or revoked.     Resp Syncytial Virus by PCR NEGATIVE NEGATIVE Final    Comment: (NOTE) Fact Sheet for Patients: BloggerCourse.com  Fact Sheet for Healthcare Providers: SeriousBroker.it  This test is not yet approved or cleared by the [place] FDA and has been authorized for detection and/or diagnosis of SARS-CoV-2 by FDA under an Emergency Use Authorization (EUA). This EUA will remain in effect (meaning this test can be used) for the duration of the COVID-19 declaration under Section 564(b)(1) of the Act, 21 U.S.C. section 360bbb-3(b)(1), unless the authorization is terminated or revoked.  Performed at [facility], [address], [place], [place] [address]   Culture, blood (Routine X 2) w Reflex to ID Panel     Status: None (Preliminary result)   Collection Time: 03/17/23  1:53 AM   Specimen: BLOOD LEFT HAND  Result Value Ref Range Status   Specimen Description BLOOD LEFT HAND  Final   Special Requests   Final    BOTTLES DRAWN AEROBIC AND ANAEROBIC Blood Culture results may not be optimal due to an inadequate volume of blood received in culture bottles   Culture   Final    NO GROWTH < 12 HOURS Performed at [facility], [address],  [place], [place] [address]    Report Status PENDING  Incomplete         Radiology Studies: CT CHEST WO CONTRAST  Result Date: 03/17/2023 CLINICAL DATA:  Left-greater-than-right basilar airspace opacities on chest x-ray today, sent to ED with altered mental status and weakness. EXAM: CT CHEST WITHOUT CONTRAST TECHNIQUE: Multidetector CT imaging  of the chest was performed following the standard protocol without IV contrast. RADIATION DOSE REDUCTION: This exam was performed according to the departmental dose-optimization program which includes automated exposure control, adjustment of the mA and/or kV according to patient size and/or use of iterative reconstruction technique. COMPARISON:  Portable chest today, portable chest 11/05/2022, CTA chest 09/09/2022, CTA chest 09/12/2021. FINDINGS: Cardiovascular: The heart is moderately enlarged, or patent chamber involvement and heavy three-vessel calcific CAD. Left IJ approach hemodialysis catheter again terminates in the right atrium. There is no substantial pericardial effusion. There is a mass with patchy calcifications in the right atrium laterally measuring 4.8 x 3.7 cm. It is unchanged in size since 09/12/2021 and was obscured by right atrial contrast on the last CTA. The mass has become progressively calcified since 2023 and probably is a dialysis catheter-related partially calcified thrombus. If further evaluation is needed it would be best done with either echocardiography or cardiac MRI. Calcified linear thrombus extends along the course of the hemodialysis catheter in the SVC. The pulmonary trunk is 3.2 cm indicating arterial hypertension. Pulmonary veins are nondistended. The thoracic aorta is heavily calcified with calcification extending across the valve leaflets and mild tortuosity. There is no aortic aneurysm. There are calcifications in the great vessels. Calcifications at the origins of the left subclavian and left common carotid arteries likely  causing flow-limiting origin stenoses with additional heavy calcification and likely stenosis at the right subclavian artery origin. Mediastinum/Nodes: Single slightly prominent right paratracheal space lymph node 1.1 cm in short axis. No new or progressive or further adenopathy is seen. There is no thyroid mass. Thoracic esophagus and thoracic trachea are unremarkable. Lungs/Pleura: There is a mildly, chronically elevated right hemidiaphragm. No pleural effusion, thickening or pneumothorax. There is mild bronchial thickening. Chronic scarring in the right middle lobe base, lateral basal right lower lobe. There is increased faint ground-glass opacity in the upper and lower lobes, which is probably due to mosaicism and air trapping, less likely pneumonitis. No focal consolidation is seen, no pulmonary nodule. Upper Abdomen: Aortic and branch vessel atherosclerosis. No acute findings. Musculoskeletal: Degenerative changes and slight dextroscoliosis thoracic spine. Dense bones consistent with renal osteodystrophy. No chest wall mass is seen. IMPRESSION: 1. Increased faint ground-glass opacity in the upper and lower lobes, probably due to mosaicism and air trapping, less likely pneumonitis. 2. Mild bronchial thickening. 3. Cardiomegaly with heavy three-vessel calcific CAD. 4. 4.8 x 3.7 cm mass with patchy calcifications in the right atrium, unchanged in size since 09/12/2021 and probably a dialysis catheter-related partially calcified thrombus. If further evaluation is needed it would be best done with either echocardiography or cardiac MRI. 5. Calcified linear thrombus extends along the course of the hemodialysis catheter in the SVC. 6. Aortic and branch vessel atherosclerosis. Likely flow-limiting stenoses of both subclavian arteries, left common carotid artery. 7. Single slightly prominent right paratracheal space lymph node. No new or progressive adenopathy. 8. Dense bones consistent with renal osteodystrophy.  Aortic Atherosclerosis (ICD10-I70.0). Electronically Signed   By: Almira Bar M.D.   On: 03/17/2023 01:10   CT Head Wo Contrast  Result Date: 03/16/2023 CLINICAL DATA:  Mental status change, unknown cause. Speech disturbance. Lethargy/weakness. EXAM: CT HEAD WITHOUT CONTRAST TECHNIQUE: Contiguous axial images were obtained from the base of the skull through the vertex without intravenous contrast. RADIATION DOSE REDUCTION: This exam was performed according to the departmental dose-optimization program which includes automated exposure control, adjustment of the mA and/or kV according to patient size and/or use of iterative  reconstruction technique. COMPARISON:  Head CT 09/27/2021 FINDINGS: Brain: There is no evidence of an acute infarct, intracranial hemorrhage, mass, midline shift, or extra-axial fluid collection. The ventricles and sulci are normal for age. Vascular: Calcified atherosclerosis at the skull base. Unchanged small calcification posteriorly in the right sylvian fissure, possibly a chronically calcified embolus within a distal right MCA branch vessel. Skull: No acute fracture or suspicious osseous lesion. Sinuses/Orbits: Partially visualized mild mucosal thickening in the maxillary sinuses. Clear mastoid air cells. Unremarkable orbits. Other: None. IMPRESSION: No evidence of acute intracranial abnormality. Electronically Signed   By: Sebastian Ache M.D.   On: 03/16/2023 14:27   DG Chest Portable 1 View  Result Date: 03/16/2023 CLINICAL DATA:  Shortness of breath. EXAM: PORTABLE CHEST 1 VIEW COMPARISON:  Radiographs 11/05/2022 and 09/08/2022.  CT 09/09/2022. FINDINGS: 1039 hours. The left lung base is partially excluded from this portable examination. Allowing for this, the heart size and mediastinal contours are stable with cardiomegaly, aortic atherosclerosis and stable widening of the superior mediastinum. Left IJ hemodialysis catheter extends to the mid right atrium, stable. Lower lung  volumes with new patchy left greater than right basilar airspace opacities. No large pleural effusion or pneumothorax identified. The bones appear unchanged. IMPRESSION: Lower lung volumes with new patchy left greater than right basilar airspace opacities, likely atelectasis. Left lower lobe pneumonia not excluded. Radiographic follow-up recommended. Electronically Signed   By: Carey Bullocks M.D.   On: 03/16/2023 10:56           LOS: 1 day   Time spent= 35 mins    Miguel Rota, MD [facility]  If 7PM-7AM, please contact night-coverage  03/17/2023, 12:54 PM

## 2023-03-17 NOTE — Progress Notes (Signed)
CSW received call from [place] at [facility] requesting updates on patient. Patient is from the facility as LTC can return once medically cleared for discharge.  Edwin Dada, MSW, LCSW Transitions of Care  Clinical Social Worker II [phone]

## 2023-03-17 NOTE — Consult Note (Signed)
Renal Service Consult Note [facility]  Courtney Gray 03/17/2023 Courtney Krabbe, MD Requesting Physician: Dr. Nelson Chimes  Reason for Consult: ESRD pt w/ AMS, somnolence HPI: The patient is a 52 y.o. year-old w/ PMH as below who presented to ED for progressive lethargy, weakness and maybe taking too much pain medication. No fevers. Pt seen in ED w/ BP 131/ 105, HR 67, K+ 5.5, creat 15.  Had not missed any HD except yesterday. Pt admitted. We are asked to see for dialysis.   Pt seen in ED.  Pt's dtr at bedside. Pt lives at [facility], does not miss her HD sessions.   ROS - n/a  Past Medical History  Past Medical History:  Diagnosis Date   Arthritis    Bacteremia    Calciphylaxis    Chronic HFrEF (heart failure with reduced ejection fraction) (HCC)    Diabetes mellitus without complication (HCC)    Dialysis complication, subsequent encounter    ESRD (end stage renal disease) (HCC)    Gout    High cholesterol    Hypertension    Hypertensive emergency    Ischemic foot pain at rest 08/03/2021   Renal disorder    Right atrial mass    Severe mitral regurgitation    Past Surgical History  Past Surgical History:  Procedure Laterality Date   ABDOMINAL AORTOGRAM W/LOWER EXTREMITY N/A 08/07/2021   Procedure: ABDOMINAL AORTOGRAM W/LOWER EXTREMITY;  Surgeon: Victorino Sparrow, MD;  Location: [facility] INVASIVE CV LAB;  Service: Cardiovascular;  Laterality: N/A;   AMPUTATION Right 08/14/2021   Procedure: AMPUTATION ABOVE KNEE;  Surgeon: Cephus Shelling, MD;  Location: [facility] OR;  Service: Vascular;  Laterality: Right;   AMPUTATION Left 09/23/2021   Procedure: AMPUTATION MIDDLE FINGER;  Surgeon: Marlyne Beards, MD;  Location: [facility] OR;  Service: Orthopedics;  Laterality: Left;   AMPUTATION Left 10/09/2021   Procedure: LEFT ABOVE KNEE AMPUTATION;  Surgeon: Leonie Douglas, MD;  Location: [facility] OR;  Service: Vascular;  Laterality: Left;   AV FISTULA PLACEMENT     x2, unsuccessful.    BUBBLE STUDY   09/27/2021   Procedure: BUBBLE STUDY;  Surgeon: Lewayne Bunting, MD;  Location: [facility] ENDOSCOPY;  Service: Cardiovascular;;   IR FLUORO GUIDE CV LINE LEFT  12/14/2020   IR FLUORO GUIDE CV LINE LEFT  02/20/2021   IR FLUORO GUIDE CV LINE LEFT  09/11/2021   IR FLUORO GUIDE CV LINE LEFT  10/18/2021   IR FLUORO GUIDE CV LINE LEFT  11/26/2021   IR FLUORO GUIDE CV LINE LEFT  11/28/2021   IR FLUORO GUIDE CV LINE LEFT  02/04/2022   IR FLUORO GUIDE CV LINE LEFT  03/07/2022   IR FLUORO GUIDE CV LINE RIGHT  08/28/2020   IR FLUORO GUIDE CV LINE RIGHT  05/13/2021   IR FLUORO GUIDE CV LINE RIGHT  05/24/2021   IR INTRAVASCULAR ULTRASOUND NON CORONARY  03/07/2022   IR PERC TUN PERIT CATH WO PORT S&I /IMAG  09/06/2020   IR PTA VENOUS EXCEPT DIALYSIS CIRCUIT  02/20/2021   IR PTA VENOUS EXCEPT DIALYSIS CIRCUIT  05/13/2021   IR PTA VENOUS EXCEPT DIALYSIS CIRCUIT  11/28/2021   IR PTA VENOUS EXCEPT DIALYSIS CIRCUIT  03/07/2022   IR REMOVAL TUN CV CATH W/O FL  05/24/2021   IR REMOVAL TUN CV CATH W/O FL  10/15/2021   IR US GUIDE VASC ACCESS LEFT  09/06/2020   IR US GUIDE VASC ACCESS LEFT  09/11/2021   IR US GUIDE VASC  ACCESS LEFT  10/18/2021   IR US GUIDE VASC ACCESS RIGHT  05/24/2021   IR US GUIDE VASC ACCESS RIGHT  09/10/2021   IR VENO/JUGULAR RIGHT  09/10/2021   IR VENOCAVAGRAM SVC  02/20/2021   IR VENOCAVAGRAM SVC  11/26/2021   IR VENOCAVAGRAM SVC  03/07/2022   TEE WITHOUT CARDIOVERSION N/A 09/27/2021   Procedure: TRANSESOPHAGEAL ECHOCARDIOGRAM (TEE);  Surgeon: Lewayne Bunting, MD;  Location: [facility] ENDOSCOPY;  Service: Cardiovascular;  Laterality: N/A;   TEE WITHOUT CARDIOVERSION N/A 11/11/2022   Procedure: TRANSESOPHAGEAL ECHOCARDIOGRAM;  Surgeon: Quintella Reichert, MD;  Location: [facility] INVASIVE CV LAB;  Service: Cardiovascular;  Laterality: N/A;   UPPER EXTREMITY VENOGRAPHY Right 12/04/2021   Procedure: UPPER EXTREMITY VENOGRAPHY;  Surgeon: Victorino Sparrow, MD;  Location: [facility] INVASIVE CV LAB;  Service: Cardiovascular;  Laterality: Right;    Family History  Family History  Problem Relation Age of Onset   Diabetes Mother    Diabetes Father    Social History  reports that she has never smoked. She has never used smokeless tobacco. She reports that she does not drink alcohol and does not use drugs. Allergies  Allergies  Allergen Reactions   Benzonatate Other (See Comments)    Hallucinations   Hydralazine Other (See Comments)    Hallucinations   Amoxicillin Itching and Nausea And Vomiting   Clonidine Other (See Comments)    Altered mental state, insomnia    Chlorhexidine Itching    Patient reports it is the CHG baths that cause her itching not the CHG used for caring for her IV/HD access.     Morphine Itching   Home medications Prior to Admission medications   Medication Sig Start Date End Date Taking? Authorizing Provider  aspirin EC 81 MG tablet Take 1 tablet (81 mg total) by mouth daily. Swallow whole. 05/16/22  Yes [place], Eric J, DO  atorvastatin (LIPITOR) 10 MG tablet Take 1 tablet (10 mg total) by mouth daily. 11/28/20  Yes Emokpae, Courage, MD  cephALEXin (KEFLEX) 500 MG capsule Take 1 capsule (500 mg total) by mouth 3 (three) times daily. 03/07/23  Yes Arby Barrette, MD  cyclobenzaprine (FLEXERIL) 10 MG tablet Take 1 tablet (10 mg total) by mouth 2 (two) times daily as needed for muscle spasms. 09/16/22  Yes Netta Corrigan, PA-C  Darbepoetin Alfa (ARANESP) 200 MCG/0.4ML SOSY injection Inject 200 mcg into the skin once a week. Every Tuesday   Yes [provider]  dicyclomine (BENTYL) 10 MG capsule Take 10 mg by mouth in the morning, at noon, and at bedtime. 11/04/22  Yes [provider]  docusate sodium (COLACE) 100 MG capsule Take 100 mg by mouth 2 (two) times daily.   Yes [provider]  famotidine (PEPCID) 20 MG tablet Take 1 tablet (20 mg total) by mouth daily. 05/16/22  Yes [place], Eric J, DO  fentaNYL (DURAGESIC) 25 MCG/HR Place 1 patch onto the skin every 3 (three) days.   Yes  [provider]  gabapentin (NEURONTIN) 300 MG capsule Take 300 mg by mouth 3 (three) times daily.   Yes [provider]  guaiFENesin (ROBITUSSIN) 100 MG/5ML liquid Take 100 mg by mouth every 4 (four) hours as needed for cough.   Yes [provider]  HYDROcodone-acetaminophen (NORCO/VICODIN) 5-325 MG tablet Take 1 tablet by mouth in the morning, at noon, in the evening, and at bedtime.   Yes [provider]  levothyroxine (SYNTHROID) 100 MCG tablet Take 100 mcg by mouth daily.  Yes [provider]  lidocaine (LIDODERM) 5 % Place 1 patch onto the skin daily. Remove & Discard patch within 12 hours or as directed by MD   Yes [provider]  lidocaine (XYLOCAINE) 5 % ointment Apply 1 Application topically See admin instructions. 1 application every 2 hours as needed for pain   Yes [provider]  midodrine (PROAMATINE) 10 MG tablet Take 1 tablet (10 mg total) by mouth every Monday, Wednesday, and Friday at 8 PM. Before hemodialysis Patient taking differently: Take 10 mg by mouth See admin instructions. Take 10 mg by mouth every Monday, Wednesday, and Friday before hemodialysis 05/16/22  Yes [place], Alvira Philips, DO  nutrition supplement, JUVEN, (JUVEN) PACK Take 1 packet by mouth 2 (two) times daily between meals.   Yes [provider]  oxyCODONE-acetaminophen (PERCOCET/ROXICET) 5-325 MG tablet Take 1 tablet by mouth every 6 (six) hours as needed for moderate pain. 10/30/22  Yes [provider]  OXYGEN Inhale 4 L into the lungs See admin instructions. Oxygen at 4L hr via nasal cannula and will use portable oxygen machine when up in wheelchair. 10/28/21  Yes [provider]  pantoprazole (PROTONIX) 40 MG tablet Take 1 tablet (40 mg total) by mouth daily. 11/29/20  Yes Shon Hale, MD  Patiromer Sorbitex Calcium (VELTASSA) 16.8 g PACK Take 16.8 g by mouth daily.   Yes [provider]  pregabalin (LYRICA) 75 MG  capsule Take 75 mg by mouth 3 (three) times daily.   Yes [provider]  senna (SENOKOT) 8.6 MG TABS tablet Take 2 tablets (17.2 mg total) by mouth daily. 11/13/22  Yes Monna Fam, MD  simethicone (MYLICON) 80 MG chewable tablet Chew 80 mg by mouth every 6 (six) hours as needed for flatulence.   Yes [provider]  sucroferric oxyhydroxide (VELPHORO) 500 MG chewable tablet Chew 500 mg by mouth 3 (three) times daily with meals.   Yes [provider]     Vitals:   03/17/23 0915 03/17/23 0930 03/17/23 1000 03/17/23 1006  BP: (!) 93/49 (!) 97/34 115/70   Pulse:      Resp: 14 14 20 17   Temp:      TempSrc:      SpO2:      Weight:      Height:       Exam Gen somnolent, wakes up then falls back asleep after trying to make conversation for a few seconds Mild asterixis No rash, cyanosis or gangrene Sclera anicteric, throat clear  No jvd or bruits Chest clear bilat to bases, no rales/ wheezing RRR no RG Abd soft ntnd no mass or ascites +bs GU defer MS no joint effusions or deformity Ext no LE or UE edema, no wounds or ulcers Neuro is alert, Ox 3 , nf    L Internal jugular TDC in place   Renal-related home meds: - gabapentin 300 tid - norco prn - percocet - home O2 4L - lyrica 75 tid - velphoro 500 ac - midodrine 10mg  pre hd mwf     OP HD: AF MWF Needs updating --> 4h  2/2 bath  Heparin 5000  [facility] TDC    Assessment/ Plan: Somnolence - assuming not hypercarbic or ^^NH3, her symptoms (asterixis, somnolence) could be due to high dose gabapentin / lyrica she is on. Would hold these both for now.  ESKD - on HD MWF. Good compliance, missed HD yest due to being in ED. Plan HD sometime later today or tonight if  staffing allows.  BP - not on any BP lowering meds, midodrine pre HD  Volume - no vol excess on exam.  Anemia of eskd - Hb 10-12 here, follow.  MBD ckd - CCa in range. Add on phos.  H/o calciphylaxis - abdominal wall wounds are healing per the  dtr.  The darkening of the 2 fingers on the R hand, the ulcer on R 2nd digit and the lesions on the finger on the L hand are not characteristic of calciphylaxis. For the R hand ischemia showed be considered. Will d/w pmd.       Vinson Moselle  MD [facility] 03/17/2023, 10:09 AM  Recent Labs  Lab 03/16/23 1140 03/16/23 2103 03/16/23 2208 03/16/23 2259  HGB  --   --  10.9* 10.1*  ALBUMIN 3.2*  --   --  2.9*  CALCIUM 9.7 9.2  --  9.1  CREATININE 13.59* 12.88*  --  12.88*  K 5.5* 5.4* 5.6* 5.3*   Inpatient medications:  aspirin EC  81 mg Oral Daily   atorvastatin  10 mg Oral Daily   azithromycin  500 mg Oral Daily   famotidine  20 mg Oral Daily   heparin  5,000 Units Subcutaneous Q8H   hydrocortisone sod succinate (SOLU-CORTEF) inj  50 mg Intravenous Q6H   levothyroxine  100 mcg Oral Daily   midodrine  5 mg Oral TID WC   pantoprazole  40 mg Oral QHS   senna  2 tablet Oral Daily    cefTRIAXone (ROCEPHIN)  IV Stopped (03/17/23 0947)   dextrose 10 % and 0.45 % NaCl Stopped (03/17/23 0526)   acetaminophen **OR** acetaminophen, guaiFENesin, hydrALAZINE, ipratropium-albuterol, metoprolol tartrate, ondansetron (ZOFRAN) IV, senna-docusate, simethicone, traZODone

## 2023-03-17 NOTE — Progress Notes (Signed)
Received patient in bed to unit.  Alert and oriented.  Informed consent signed and in chart.   TX duration:3 hours  Patient tolerated well.  Transported back to the room  Alert, without acute distress.  Hand-off given to patient's nurse.   Access used: Left internal jugular HD cath Access issues: none  Total UF removed: 2L Medication(s) given: midodrine   03/17/23 1818  Vitals  Temp 98.1 F (36.7 C)  Temp Source Oral  BP 126/75  Pulse Rate 60  ECG Heart Rate 85  Resp 16  Oxygen Therapy  SpO2 100 %  O2 Device Nasal Cannula  O2 Flow Rate (L/min) 3 L/min  During Treatment Monitoring  Duration of HD Treatment -hour(s) 3 hour(s)  Cumulative Fluid Removed (mL) per Treatment  2000  HD Safety Checks Performed Yes  Intra-Hemodialysis Comments Tx completed;Tolerated well  Dialysis Fluid Bolus Normal Saline  Bolus Amount (mL) 300 mL  Post Treatment  Dialyzer Clearance Lightly streaked  Liters Processed 72  Fluid Removed (mL) 2000 mL  Tolerated HD Treatment Yes  Hemodialysis Catheter Left Internal jugular Double lumen Permanent (Tunneled)  Placement Date/Time: 03/07/22 1105   Serial / Lot #: [id number]  Expiration Date: 09/09/26  Time Out: Correct patient;Correct site;Correct procedure  Maximum sterile barrier precautions: Hand hygiene;Cap;Mask;Sterile gown;Sterile gloves;Large sterile ...  Site Condition No complications  Blue Lumen Status Flushed;Heparin locked;Dead end cap in place  Red Lumen Status Flushed;Heparin locked;Dead end cap in place  Purple Lumen Status N/A  Catheter fill solution Heparin 1000 units/ml  Catheter fill volume (Arterial) 2.1 cc  Catheter fill volume (Venous) 2.1  Dressing Type Transparent  Dressing Status Antimicrobial disc in place;Clean, Dry, Intact  Interventions  (deaccessed)  Drainage Description None  Dressing Change Due 03/24/23  Post treatment catheter status Capped and Clamped     Stacie Glaze LPN Kid[facility] Dialysis [facility]

## 2023-03-17 NOTE — ED Notes (Signed)
HD contacted. Pt is not currently on their list

## 2023-03-17 NOTE — ED Notes (Signed)
Alert, NAD, calm, interactive, eating, daughter at [facility]. More awake and interactive than previously.

## 2023-03-17 NOTE — Hospital Course (Addendum)
52 year old with history of HTN, ESRD on HD MWF, NSTEMI, obesity, DM2 status post bilateral AKA, PAD, chronic hypoxia, hypothyroidism comes to the ED for fatigue and change in mental status started prior to the day of admission.  Chest x-ray showed possible pneumonia versus atelectasis.  CT of the head was negative.  Started on Rocephin and azithromycin.  Patient is also had frequent hypoglycemic episodes-symptomatic with encephalopathy. Patient is from the facility as LTC and can return once medically stable-TOC following.  Patient was seen by vascular surgery for discolored fingers and pain likely chronic ischemia, underwent arterial duplex of upper extremities Patient is having discoloration and pain of her fingers underwent upper extremity arterial duplex bilateral no obvious evidence of significant stenosis or occlusion.  Seen by vascular surgery, patient aware high risk for finger amputation.

## 2023-03-17 NOTE — ED Notes (Signed)
Transport here to take pt to HD

## 2023-03-17 NOTE — ED Notes (Addendum)
Renal MD at [facility]. Pt needed much coaching/ prompting to take PO meds. Remains sleepy. Arousable to voice, poor concentration, minimally interactive. Daughter at [facility] verbalizes not usual for pt.

## 2023-03-17 NOTE — ED Notes (Signed)
Attempted two times for blood cultures was unsuccessful

## 2023-03-17 NOTE — ED Notes (Signed)
Daughter at [facility]. Pt remains sleeping/ somnolent. Arousable to voice, poor interaction. Consent signed.

## 2023-03-17 NOTE — Progress Notes (Signed)
SLP Cancellation Note  Patient Details Name: Courtney Gray MRN: [id number] DOB: September 20, 1970   Cancelled treatment:       Reason Eval/Treat Not Completed: Patient at procedure or test/unavailable (HD). SLP will continue following.    Gwynneth Aliment, M.A., CF-SLP Speech Language Pathology, Acute Rehabilitation Services  Secure Chat preferred [phone]  03/17/2023, 2:24 PM

## 2023-03-17 NOTE — Progress Notes (Addendum)
Hypotension SIRS vs adrenal insufficiency: SIRS in the setting of [facility] pneumonia - CT page from bedside nurse that patient's blood pressure has been dropped to 86/49, tachycardic 153.  Tachypneic 22 and O2 sat 96% room air. - Per chart review patient has been admitted for concern for lethargy in the setting of hypoglycemia and community-acquired pneumonia - Patient is afebrile. leukocytosis 12.  Initial lactic acid WNL. Chest x-ray showed lower lung volume with new patchy left greater than right basilar airspace opacity likely atelectasis, left lower lobe pneumonia which cannot be excluded.  Pending CT chest. -Currently patient has been treating with IV ceftriaxone and azithromycin for the management of pneumonia.  -Patient became hypotensive while in the ED. patient has history of chronic hypotension and takes midodrine 10 mg MWF schedule and ESRD on HD on MWF schedule as well. Reported last dialysis on Friday 03/13/2023.  -In the ED patient has been given 500 mL of NS bolus and midodrine 10 mg.  MAP has been improved to 59 to 80. -Checking lactic acid, Pro-Cal and blood cultures. - If lactic acid continues to trend up in that case will broaden antibiotic. -Will avoid further IV fluid bolus as a dialysis patient.  Currently also on D10 with low fluid rate 40 mL/min for the management of persistent hypoglycemia. - Continue midodrine MWF schedule. - Checking morning cortisol and TSH level. Addendum: - Blood pressure has been improved to 130/70 after 500 mL of NS bolus and midodrine 10 mg. -CT chest showed increased faint ground-glass opacity in the upper and lower lobes, probably due to mosaicism and air trapping, less likely pneumonitis. -Plan to continue treat empirically for pneumonia until sputum culture and blood cultures will be resulted.  Update at 4:10 AM  blood pressure again dropped to 71/47 and MAP 55.  Giving 250 mL of NS bolus, 50 g of albumin and midodrine 10 mg second dose.   Lactic acid 1.2 WNL. -As patient presenting with lethargy, hypoglycemia, (hyperkalemia and hyponatremia unable to interpret as dialysis patient) and persistent hypotension concern for adrenal insufficiency.  1 AM serum cortisol 17> intermediate probability of adrenal insufficiency so checking ACTH stimulation test. -After finishing the cosyntropin stimulation test and checking the second the 3rd cortisol level will start Solu-Cortef 100 mg 1 dose followed by 50 mg every 6 hours for 4 doses. -Once steroid would be started need to discontinue the dextrose drip as there is significant risk for hyperglycemia - If ACTH stimulation test is negative in that case need to stop the steroid treatment. -If patient blood glucose continues to trend high need to start sliding scale SSI insulin. -Continue Protonix 40 mg IV twice daily as long as continuing the IV steroid. -Please follow-up with cosyntropin stimulation test and decide further to continue treat with IV hydrocortisone. -Also continuing midodrine 5 mg 3 times daily (patient used  to take 10 mg MWF schedule) -Spoke with on-call PCCM physician Dr. Celine Mans; available to assistance for hypotension if blood pressure does not improve with IV steroid.   Persistent hypoglycemia: -Currently on D10 half-normal NS 40 cc/h.  Once will start IV hydrocortisone (as scheduled around 8 AM) need to stop the IV D10 fluid.   Chronic pain of the bilateral fingers tips: - Patient is complaining about bilateral fingertip pains due to calciphylaxis.  Plan to continue Tylenol as needed.  Avoid narcotics and gabapentin in the setting of hypotension.   Hyperkalemia - Elevated potassium 5.5 which has been slightly improved to 5.3. Giving calcium gluconate  1 g and bicarb 50 mEq 1 dose.  Given patient is persistently hypoglycemic avoiding insulin and already got Lokelma 10 g in the ED.  Right atrial calcified mass: CT chest finding: IMPRESSION: 1. Increased faint  ground-glass opacity in the upper and lower lobes, probably due to mosaicism and air trapping, less likely pneumonitis. 2. Mild bronchial thickening. 3. Cardiomegaly with heavy three-vessel calcific CAD. 4. 4.8 x 3.7 cm mass with patchy calcifications in the right atrium, unchanged in size since 09/12/2021 and probably a dialysis catheter-related partially calcified thrombus. If further evaluation is needed it would be best done with either echocardiography or cardiac MRI. 5. Calcified linear thrombus extends along the course of the hemodialysis catheter in the SVC. 6. Aortic and branch vessel atherosclerosis. Likely flow-limiting stenoses of both subclavian arteries, left common carotid artery. 7. Single slightly prominent right paratracheal space lymph node. No new or progressive adenopathy. 8. Dense bones consistent with renal osteodystrophy.  -CT scan showing incidental finding of right atrial calcified mass.  Spoke with on-call cardiology Dr. Granville Lewis.  Cardiology reviewed the previous echocardiogram and echo from 11/11/2022 which showed the calcified mass but it is not documented on the echo report but it was measured by the echo tech.  As patient's clinical symptom is not reflecting pulmonary embolism and cardiogenic shock there is no indication for any intervention regarding this right-sided calcified mass.  Given patient  takes midodrine for blood pressure support to maintain dialysis Dr. Hulan Saas stated that it would be helpful if we can do echocardiogram simultaneously during the dialysis session in the hospital.  -As there is no emergent need for cardiology consult at this time requested on-call cardiology to see the patient in the daytime. -Also found out that patient does not have any outpatient cardiology follow-up and need to establish and cardiology care for ongoing care.    CRITICAL CARE Performed by: Tereasa Coop, MD     Total critical care time: 30 minutes   Critical  care time was exclusive of separately billable procedures and treating other patients.   Critical care was necessary to treat or prevent imminent or life-threatening deterioration.   Critical care was time spent personally by me on the following activities: development of treatment plan with patient and/or surrogate as well as nursing, discussions with consultants, evaluation of patient's response to treatment, examination of patient, obtaining history from patient or surrogate, ordering and performing treatments and interventions, ordering and review of laboratory studies, ordering and review of radiographic studies, pulse oximetry and re-evaluation of patient's condition.

## 2023-03-17 NOTE — ED Notes (Signed)
Admitting MD at [facility]. VSS. sBP 113. Pt remains sleepy, arousable to voice. Fentanyl patch removed from L shoulder.

## 2023-03-17 NOTE — Procedures (Signed)
I have reviewed the HD regimen and made appropriate changes.  Vinson Moselle MD  [facility] 03/17/2023, 4:42 PM

## 2023-03-17 NOTE — ED Notes (Signed)
HD contacted, no known timeframe for HD

## 2023-03-18 ENCOUNTER — Inpatient Hospital Stay (HOSPITAL_COMMUNITY): Payer: 59

## 2023-03-18 DIAGNOSIS — E162 Hypoglycemia, unspecified: Secondary | ICD-10-CM | POA: Diagnosis not present

## 2023-03-18 LAB — GLUCOSE, CAPILLARY
Glucose-Capillary: 91 mg/dL (ref 70–99)
Glucose-Capillary: 135 mg/dL — ABNORMAL HIGH (ref 70–99)

## 2023-03-18 LAB — BASIC METABOLIC PANEL
Anion gap: 16 — ABNORMAL HIGH (ref 5–15)
BUN: 40 mg/dL — ABNORMAL HIGH (ref 6–20)
CO2: 21 mmol/L — ABNORMAL LOW (ref 22–32)
Calcium: 10 mg/dL (ref 8.9–10.3)
Chloride: 94 mmol/L — ABNORMAL LOW (ref 98–111)
Creatinine, Ser: 8.68 mg/dL — ABNORMAL HIGH (ref 0.44–1.00)
GFR, Estimated: 5 mL/min — ABNORMAL LOW (ref >60)
Glucose, Bld: 112 mg/dL — ABNORMAL HIGH (ref 70–99)
Potassium: 4.8 mmol/L (ref 3.5–5.1)
Sodium: 131 mmol/L — ABNORMAL LOW (ref 135–145)

## 2023-03-18 LAB — CBC
HCT: 33.6 % — ABNORMAL LOW (ref 36.0–46.0)
Hemoglobin: 10.5 g/dL — ABNORMAL LOW (ref 12.0–15.0)
MCH: 30.1 pg (ref 26.0–34.0)
MCHC: 31.3 g/dL (ref 30.0–36.0)
MCV: 96.3 fL (ref 80.0–100.0)
Platelets: 219 10*3/uL (ref 150–400)
RBC: 3.49 MIL/uL — ABNORMAL LOW (ref 3.87–5.11)
RDW: 18.8 % — ABNORMAL HIGH (ref 11.5–15.5)
WBC: 12.6 10*3/uL — ABNORMAL HIGH (ref 4.0–10.5)
nRBC: 0.2 % (ref 0.0–0.2)

## 2023-03-18 LAB — MAGNESIUM: Magnesium: 2.2 mg/dL (ref 1.7–2.4)

## 2023-03-18 LAB — HEPATITIS B SURFACE ANTIBODY, QUANTITATIVE: Hep B S AB Quant (Post): 4.4 m[IU]/mL — ABNORMAL LOW

## 2023-03-18 LAB — C-PEPTIDE: C-Peptide: 10.7 ng/mL — ABNORMAL HIGH (ref 1.1–4.4)

## 2023-03-18 LAB — PHOSPHORUS: Phosphorus: 7.3 mg/dL — ABNORMAL HIGH (ref 2.5–4.6)

## 2023-03-18 MED ORDER — SACCHAROMYCES BOULARDII 250 MG PO CAPS
250.0000 mg | ORAL_CAPSULE | Freq: Two times a day (BID) | ORAL | Status: DC
  Administered 2023-03-22 – 2023-03-25 (×3): 250 mg via ORAL
  Filled 2023-03-18 (×13): qty 1

## 2023-03-18 MED ORDER — SUCROFERRIC OXYHYDROXIDE 500 MG PO CHEW
500.0000 mg | CHEWABLE_TABLET | Freq: Three times a day (TID) | ORAL | Status: DC
  Administered 2023-03-18 – 2023-03-23 (×6): 500 mg via ORAL
  Filled 2023-03-18 (×17): qty 1

## 2023-03-18 NOTE — Evaluation (Signed)
Clinical/Bedside Swallow Evaluation Patient Details  Name: Courtney Gray MRN: [id number] Date of Birth: December 06, 1970  Today's Date: 03/18/2023 Time: SLP Start Time (ACUTE ONLY): 9563 SLP Stop Time (ACUTE ONLY): 0935 SLP Time Calculation (min) (ACUTE ONLY): 11 min  Past Medical History:  Past Medical History:  Diagnosis Date   Arthritis    Bacteremia    Calciphylaxis    Chronic HFrEF (heart failure with reduced ejection fraction) (HCC)    Diabetes mellitus without complication (HCC)    Dialysis complication, subsequent encounter    ESRD (end stage renal disease) (HCC)    Gout    High cholesterol    Hypertension    Hypertensive emergency    Ischemic foot pain at rest 08/03/2021   Renal disorder    Right atrial mass    Severe mitral regurgitation    Past Surgical History:  Past Surgical History:  Procedure Laterality Date   ABDOMINAL AORTOGRAM W/LOWER EXTREMITY N/A 08/07/2021   Procedure: ABDOMINAL AORTOGRAM W/LOWER EXTREMITY;  Surgeon: Victorino Sparrow, MD;  Location: [facility] INVASIVE CV LAB;  Service: Cardiovascular;  Laterality: N/A;   AMPUTATION Right 08/14/2021   Procedure: AMPUTATION ABOVE KNEE;  Surgeon: Cephus Shelling, MD;  Location: [facility] OR;  Service: Vascular;  Laterality: Right;   AMPUTATION Left 09/23/2021   Procedure: AMPUTATION MIDDLE FINGER;  Surgeon: Marlyne Beards, MD;  Location: [facility] OR;  Service: Orthopedics;  Laterality: Left;   AMPUTATION Left 10/09/2021   Procedure: LEFT ABOVE KNEE AMPUTATION;  Surgeon: Leonie Douglas, MD;  Location: [facility] OR;  Service: Vascular;  Laterality: Left;   AV FISTULA PLACEMENT     x2, unsuccessful.    BUBBLE STUDY  09/27/2021   Procedure: BUBBLE STUDY;  Surgeon: Lewayne Bunting, MD;  Location: [facility] ENDOSCOPY;  Service: Cardiovascular;;   IR FLUORO GUIDE CV LINE LEFT  12/14/2020   IR FLUORO GUIDE CV LINE LEFT  02/20/2021   IR FLUORO GUIDE CV LINE LEFT  09/11/2021   IR FLUORO GUIDE CV LINE LEFT  10/18/2021   IR FLUORO GUIDE CV LINE LEFT   11/26/2021   IR FLUORO GUIDE CV LINE LEFT  11/28/2021   IR FLUORO GUIDE CV LINE LEFT  02/04/2022   IR FLUORO GUIDE CV LINE LEFT  03/07/2022   IR FLUORO GUIDE CV LINE RIGHT  08/28/2020   IR FLUORO GUIDE CV LINE RIGHT  05/13/2021   IR FLUORO GUIDE CV LINE RIGHT  05/24/2021   IR INTRAVASCULAR ULTRASOUND NON CORONARY  03/07/2022   IR PERC TUN PERIT CATH WO PORT S&I /IMAG  09/06/2020   IR PTA VENOUS EXCEPT DIALYSIS CIRCUIT  02/20/2021   IR PTA VENOUS EXCEPT DIALYSIS CIRCUIT  05/13/2021   IR PTA VENOUS EXCEPT DIALYSIS CIRCUIT  11/28/2021   IR PTA VENOUS EXCEPT DIALYSIS CIRCUIT  03/07/2022   IR REMOVAL TUN CV CATH W/O FL  05/24/2021   IR REMOVAL TUN CV CATH W/O FL  10/15/2021   IR US GUIDE VASC ACCESS LEFT  09/06/2020   IR US GUIDE VASC ACCESS LEFT  09/11/2021   IR US GUIDE VASC ACCESS LEFT  10/18/2021   IR US GUIDE VASC ACCESS RIGHT  05/24/2021   IR US GUIDE VASC ACCESS RIGHT  09/10/2021   IR VENO/JUGULAR RIGHT  09/10/2021   IR VENOCAVAGRAM SVC  02/20/2021   IR VENOCAVAGRAM SVC  11/26/2021   IR VENOCAVAGRAM SVC  03/07/2022   TEE WITHOUT CARDIOVERSION N/A 09/27/2021   Procedure: TRANSESOPHAGEAL ECHOCARDIOGRAM (TEE);  Surgeon: Lewayne Bunting, MD;  Location: [facility]  ENDOSCOPY;  Service: Cardiovascular;  Laterality: N/A;   TEE WITHOUT CARDIOVERSION N/A 11/11/2022   Procedure: TRANSESOPHAGEAL ECHOCARDIOGRAM;  Surgeon: Quintella Reichert, MD;  Location: [facility] INVASIVE CV LAB;  Service: Cardiovascular;  Laterality: N/A;   UPPER EXTREMITY VENOGRAPHY Right 12/04/2021   Procedure: UPPER EXTREMITY VENOGRAPHY;  Surgeon: Victorino Sparrow, MD;  Location: [facility] INVASIVE CV LAB;  Service: Cardiovascular;  Laterality: Right;   HPI:  Courtney Gray is a 52 y.o. female with medical history significant of   HTN, ESRD on MWF, NSTEMI, obesity, and DM s/p BL AKA 2/2 PAD, GERD, Chronic hypoxic respiratory failure, hypothyroidism, who presents to ed with fatigue and change in mental status., fell, difficulty speaking. Found to have hypoglycemia with  encephalopathy, community acquired pna, acute encephalopathy    Assessment / Plan / Recommendation  Clinical Impression  Pt denies difficulty swallowing but states sometimes she feels like she has phlegm in her throat. She has a history of GERD but denies symptoms. Oropharyngeal swallow appears within functional limits without concern for aspiration. She is missing some anterior maxillary dentition but denies difficutly chewing. There were no s/s aspiration with multiple cup sips thin via straw. She coughed once after cracker but likely due to particulate matter with cracker and possibly dryness. Mastication timely without residue. Recommend she continue regular consistency, thin liquids, pills with liquid and stay sitting up after meals. No follow up needed. SLP Visit Diagnosis: Dysphagia, unspecified (R13.10)    Aspiration Risk  No limitations    Diet Recommendation Regular;Thin liquid    Liquid Administration via: Cup;Straw Medication Administration: Whole meds with liquid Supervision: Patient able to self feed Postural Changes: Seated upright at 90 degrees    Other  Recommendations Oral Care Recommendations: Oral care BID    Recommendations for follow up therapy are one component of a multi-disciplinary discharge planning process, led by the attending physician.  Recommendations may be updated based on patient status, additional functional criteria and insurance authorization.  Follow up Recommendations No SLP follow up      Assistance Recommended at Discharge    Functional Status Assessment Patient has not had a recent decline in their functional status  Frequency and Duration            Prognosis        Swallow Study   General Date of Onset: 03/18/23 HPI: Courtney Gray is a 52 y.o. female with medical history significant of   HTN, ESRD on MWF, NSTEMI, obesity, and DM s/p BL AKA 2/2 PAD, GERD, Chronic hypoxic respiratory failure, hypothyroidism, who presents to ed with  fatigue and change in mental status., fell, difficulty speaking. Found to have hypoglycemia with encephalopathy, community acquired pna, acute encephalopathy Type of Study: Bedside Swallow Evaluation Previous Swallow Assessment:  (none) Diet Prior to this Study: Regular;Thin liquids (Level 0) Temperature Spikes Noted: No Respiratory Status: Nasal cannula History of Recent Intubation: No Behavior/Cognition: Alert;Cooperative;Pleasant mood Oral Cavity Assessment: Within Functional Limits Oral Care Completed by SLP: No Oral Cavity - Dentition: Missing dentition (missing several maxillary and mandibular) Vision: Functional for self-feeding Self-Feeding Abilities: Needs assist (due to tremors) Patient Positioning: Upright in bed Baseline Vocal Quality: Normal Volitional Cough: Strong Volitional Swallow: Able to elicit    Oral/Motor/Sensory Function Overall Oral Motor/Sensory Function: Within functional limits   Ice Chips Ice chips: Not tested   Thin Liquid Thin Liquid: Within functional limits    Nectar Thick Nectar Thick Liquid: Not tested   Honey Thick Honey Thick Liquid: Not tested  Puree Puree: Not tested   Solid     Solid: Within functional limits      Royce Macadamia 03/18/2023,9:46 AM

## 2023-03-18 NOTE — Plan of Care (Signed)

## 2023-03-18 NOTE — Progress Notes (Signed)
[facility] Progress Note   Subjective:   Feeling better today but reports pain in her finger. Discussed that lyrica/gabapentin dose was too high for ESRD patient. She denies SOB, CP, dizziness, nausea. Having pain in R hand.   Objective Vitals:   03/18/23 0430 03/18/23 0908 03/18/23 1100 03/18/23 1150  BP:   (!) 87/66   Pulse:   79   Resp:   18   Temp: 98.1 F (36.7 C) 98 F (36.7 C)  97.8 F (36.6 C)  TempSrc: Oral Oral  Oral  SpO2:   100%   Weight:      Height:       Physical Exam General: Alert female in NAD, no asterixis noted Heart: RRR, no murmurs, rubs or gallops Lungs: CTA bilaterally, respirations unlabored Abdomen: Soft, non-distended, +BS Extremities:b/l AKA, no edema noted Dialysis Access:  L internal jugular TDC  Additional Objective Labs: Basic Metabolic Panel: Recent Labs  Lab 03/16/23 2259 03/17/23 1430 03/18/23 0745  NA 133* 133* 131*  K 5.3* 6.0* 4.8  CL 95* 96* 94*  CO2 20* 20* 21*  GLUCOSE 132* 119* 112*  BUN 63* 65* 40*  CREATININE 12.88* 14.09* 8.68*  CALCIUM 9.1 9.4 10.0  PHOS  --   --  7.3*   Liver Function Tests: Recent Labs  Lab 03/16/23 1140 03/16/23 2259 03/17/23 1430  AST 20 19 16   ALT 17 17 15   ALKPHOS 74 63 63  BILITOT 1.0 0.8 0.7  PROT 7.4 6.7 7.2  ALBUMIN 3.2* 2.9* 3.4*   No results for input(s): "LIPASE", "AMYLASE" in the last 168 hours. CBC: Recent Labs  Lab 03/16/23 1055 03/16/23 2208 03/16/23 2259 03/17/23 1430 03/18/23 0745  WBC 10.2  --  12.4* 15.3* 12.6*  NEUTROABS  --   --  9.6*  --   --   HGB 10.8*   < > 10.1* 10.1* 10.5*  HCT 35.7*   < > 33.9* 33.1* 33.6*  MCV 98.1  --  100.0 98.5 96.3  PLT 229  --  220 226 219   < > = values in this interval not displayed.   Blood Culture    Component Value Date/Time   SDES BLOOD LEFT HAND 03/17/2023 0153   SPECREQUEST  03/17/2023 0153    BOTTLES DRAWN AEROBIC AND ANAEROBIC Blood Culture results may not be optimal due to an inadequate volume  of blood received in culture bottles   CULT  03/17/2023 0153    NO GROWTH 1 DAY Performed at [facility], [address] [place], [place] [address]    REPTSTATUS PENDING 03/17/2023 0153    Cardiac Enzymes: No results for input(s): "CKTOTAL", "CKMB", "CKMBINDEX", "TROPONINI" in the last 168 hours. CBG: Recent Labs  Lab 03/17/23 0737 03/17/23 0959 03/17/23 1209 03/17/23 1945 03/18/23 0943  GLUCAP 83 106* 81 91 135*   Iron Studies: No results for input(s): "IRON", "TIBC", "TRANSFERRIN", "FERRITIN" in the last 72 hours. @lablastinr3 @ Studies/Results: CT CHEST WO CONTRAST  Result Date: 03/17/2023 CLINICAL DATA:  Left-greater-than-right basilar airspace opacities on chest x-ray today, sent to ED with altered mental status and weakness. EXAM: CT CHEST WITHOUT CONTRAST TECHNIQUE: Multidetector CT imaging of the chest was performed following the standard protocol without IV contrast. RADIATION DOSE REDUCTION: This exam was performed according to the departmental dose-optimization program which includes automated exposure control, adjustment of the mA and/or kV according to patient size and/or use of iterative reconstruction technique. COMPARISON:  Portable chest today, portable chest 11/05/2022, CTA chest  09/09/2022, CTA chest 09/12/2021. FINDINGS: Cardiovascular: The heart is moderately enlarged, or patent chamber involvement and heavy three-vessel calcific CAD. Left IJ approach hemodialysis catheter again terminates in the right atrium. There is no substantial pericardial effusion. There is a mass with patchy calcifications in the right atrium laterally measuring 4.8 x 3.7 cm. It is unchanged in size since 09/12/2021 and was obscured by right atrial contrast on the last CTA. The mass has become progressively calcified since 2023 and probably is a dialysis catheter-related partially calcified thrombus. If further evaluation is needed it would be best done with either echocardiography or  cardiac MRI. Calcified linear thrombus extends along the course of the hemodialysis catheter in the SVC. The pulmonary trunk is 3.2 cm indicating arterial hypertension. Pulmonary veins are nondistended. The thoracic aorta is heavily calcified with calcification extending across the valve leaflets and mild tortuosity. There is no aortic aneurysm. There are calcifications in the great vessels. Calcifications at the origins of the left subclavian and left common carotid arteries likely causing flow-limiting origin stenoses with additional heavy calcification and likely stenosis at the right subclavian artery origin. Mediastinum/Nodes: Single slightly prominent right paratracheal space lymph node 1.1 cm in short axis. No new or progressive or further adenopathy is seen. There is no thyroid mass. Thoracic esophagus and thoracic trachea are unremarkable. Lungs/Pleura: There is a mildly, chronically elevated right hemidiaphragm. No pleural effusion, thickening or pneumothorax. There is mild bronchial thickening. Chronic scarring in the right middle lobe base, lateral basal right lower lobe. There is increased faint ground-glass opacity in the upper and lower lobes, which is probably due to mosaicism and air trapping, less likely pneumonitis. No focal consolidation is seen, no pulmonary nodule. Upper Abdomen: Aortic and branch vessel atherosclerosis. No acute findings. Musculoskeletal: Degenerative changes and slight dextroscoliosis thoracic spine. Dense bones consistent with renal osteodystrophy. No chest wall mass is seen. IMPRESSION: 1. Increased faint ground-glass opacity in the upper and lower lobes, probably due to mosaicism and air trapping, less likely pneumonitis. 2. Mild bronchial thickening. 3. Cardiomegaly with heavy three-vessel calcific CAD. 4. 4.8 x 3.7 cm mass with patchy calcifications in the right atrium, unchanged in size since 09/12/2021 and probably a dialysis catheter-related partially calcified  thrombus. If further evaluation is needed it would be best done with either echocardiography or cardiac MRI. 5. Calcified linear thrombus extends along the course of the hemodialysis catheter in the SVC. 6. Aortic and branch vessel atherosclerosis. Likely flow-limiting stenoses of both subclavian arteries, left common carotid artery. 7. Single slightly prominent right paratracheal space lymph node. No new or progressive adenopathy. 8. Dense bones consistent with renal osteodystrophy. Aortic Atherosclerosis (ICD10-I70.0). Electronically Signed   By: Almira Bar M.D.   On: 03/17/2023 01:10   Medications:  cefTRIAXone (ROCEPHIN)  IV Stopped (03/17/23 0947)    aspirin EC  81 mg Oral Daily   atorvastatin  10 mg Oral Daily   azithromycin  500 mg Oral Daily   famotidine  20 mg Oral Daily   heparin  5,000 Units Subcutaneous Q8H   heparin sodium (porcine)  4,200 Units Intracatheter Once   levothyroxine  100 mcg Oral Daily   midodrine  10 mg Oral Q M,W,F-HD   midodrine  5 mg Oral TID WC   pantoprazole  40 mg Oral QHS   senna  2 tablet Oral Daily     Assessment/Plan: Somnolence - assuming not hypercarbic or ^^NH3, her symptoms (asterixis, somnolence) could be due to high dose gabapentin / lyrica she is  on. Would hold these both for now. Improved post HD. Max gabapentin dose for ESRD patient is 300mg  daily.  ESKD - on HD MWF. Had HD yesterday due to missed HD Monday. She would like to wait until tomorrow for next HD, no acute indications, will plan for HD tomorrow.  BP - not on any BP lowering meds, midodrine pre HD  Volume - no vol excess on exam.  Anemia of eskd - Hb 10-12 here, follow.  MBD ckd - CCa in range. Phos elevated, resumed velphoro H/o calciphylaxis - abdominal wall wounds are healing per the dtr.  The darkening of the 2 fingers on the R hand, the ulcer on R 2nd digit and the lesions on the finger on the L hand are not characteristic of calciphylaxis. For the R hand ischemia showed be  considered. Dopplers pending  Rogers Blocker, PA-C 03/18/2023, 12:47 PM  [facility] Pager: [phone]

## 2023-03-18 NOTE — Consult Note (Signed)
WOC Nurse Consult Note: Reason for Consult:H/o calciphylaxis - abdominal wall wounds are healing per the dtr.  The darkening of the 2 fingers on the R hand, the ulcer on R 2nd digit and the lesions on the finger on the L hand are not characteristic of calciphylaxis. For the R hand ischemia showed be considered. 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    Wound type:calciphylaxis  moisture associated skin damage, full thickness to perineum Pressure Injury POA: Yes Measurement: right ischium:  4 cm x 4 cm pale scar present Wound bed:see above   Drainage (amount, consistency, odor) None noted, wounds are dry. Minimal weeping to breakdown in perineum Periwound: intact  dry skin Dressing procedure/placement/frequency: After bathing daily.  Foam dressings to abdominal wounds.  Aquacel for protection to right ischial wound (LAWSON # [id number])  cover with foam dressing and change every other day.  Finger can be left open to air or covered with foam dressings per patient comfort.  Change every other day  Will not follow at this time.  Please re-consult if needed.  Mike Gip MSN, RN, FNP-BC CWON Wound, Ostomy, Continence Nurse [facility]patient [facility] [phone] Pager [phone]

## 2023-03-18 NOTE — Progress Notes (Signed)
On discharge, patient is requesting for nephrology to dose her medications. She is requesting a list of medications that she was given prior to arrival and another list with the proper dosage medications.

## 2023-03-18 NOTE — Progress Notes (Signed)
Pt receives out-pt HD at [facility] [place] on MWF. Will assist as needed.   Olivia Canter Renal Navigator [phone]

## 2023-03-18 NOTE — Progress Notes (Signed)
PROGRESS NOTE Courtney Gray  AVW:[id number] DOB: 08/11/1970 DOA: 03/16/2023 PCP: Default, Provider, MD  Brief Narrative/Hospital Course: 52 year old with history of HTN, ESRD on HD MWF, NSTEMI, obesity, DM2 status post bilateral AKA, PAD, chronic hypoxia, hypothyroidism comes to the ED for fatigue and change in mental status started prior to the day of admission.  Chest x-ray showed possible pneumonia versus atelectasis.  CT of the head was negative.  Started on Rocephin and azithromycin.  Patient is also had frequent hypoglycemic episodes-symptomatic with encephalopathy. Patient is from the facility as LTC and can return once medically stable-TOC following    Subjective: Patient seen and examined this morning Daughter at the bedside Alert awake pleasant They are concerned about going back to [facility] and want to talk with social worker overnight afebrile BP stable on 3 L nasal cannula.  Labs reviewed potassium normalized 4.8 blood sugar 112 anion gap 16 with bicarb 21 about the same as before WBC down 15>12.3   Assessment and Plan: Active Problems:   Hypoglycemia   Hyperkalemia   Idiopathic hypotension   Symptomatic hypoglycemia with encephalopathy Symptomatic recurrent hypoglycemic episodes.  Apparently raising concern for adrenal insufficiency.  Patient also has underlying infection.  Blood sugar has stabilized patient was given steroid stress dose.  Cosyntropin test obtained 12/10 with appropriate response. WEAN OFF STEROID. If has hypoglycemic episode again, should correlate with BMP. Her Ammonia, TSH normal. Recent Labs  Lab 03/16/23 2259 03/17/23 0039 03/17/23 0737 03/17/23 0959 03/17/23 1209 03/17/23 1945 03/18/23 0943  GLUCAP  --    < > 83 106* 81 91 135*  HGBA1C 5.1  --   --   --   --   --   --    < > = values in this interval not displayed.    Acute encephalopathy, metabolic toxic -Multifactorial with infection, hypoglycemia, multiple sedative medications.  Mental  status alert awake oriented and stable.  Caution with psychotropic medication.  Of fentanyl patch.   Community-acquired pneumonia CT scan shows upper lobe pneumonitis raising concern for possible pneumonia.  For now we will empirically continue antibiotics.  Bronchodilators/I-S/flutter valve.  Procalcitonin 0.39.  Will order speech and swallow evaluation to ensure there is no aspiration aspect  Bilateral upper extremity darkening of fingertips Right index fingertip dark with some ulceration.  Does not appear to be calcification.  This is likely from blood flow compromise but appears to be chronic.  Will obtain bilateral upper extremity ultrasound arterial Dopplers.     ESRD MWF Hyperkalemia Getting her scheduled dialysis LAST ON 12/10. Nephro following.  Potassium improved   Congestive heart failure with reduced EF, 35% Echocardiogram done in August 2024 shows EF 35% with global hypokinesia.  Volume status will be managed with dialysis   Anemia chronic disease Hemoglobin currently stable around 10.0. trend Recent Labs  Lab 03/16/23 1055 03/16/23 2208 03/16/23 2259 03/17/23 1430 03/18/23 0745  HGB 10.8* 10.9* 10.1* 10.1* 10.5*  HCT 35.7* 32.0* 33.9* 33.1* 33.6*      Calciphylaxis  continue wound care w   Hypothyroidism  TSH is stable continue home Synthroid    Essential hypertension BP soft hypotensive but measuring on upper arm in wristcontinue her home midodrine.  Continue home midodrine, adrenal insufficiency ruled out.  Antihypertensives on hold.   PAD s/p B/l BKA cont spirin and statin   GERD Protonix daily  Pressure injury thigh posteriorly Right  stage IV POA Pressure Injury 03/17/23 Thigh Posterior;Proximal;Right Stage 4 - Full thickness tissue loss with exposed bone,  tendon or muscle. (Active)  03/17/23 2000  Location: Thigh  Location Orientation: Posterior;Proximal;Right  Staging: Stage 4 - Full thickness tissue loss with exposed bone, tendon or muscle.   Wound Description (Comments):   Present on Admission: Yes  Dressing Type Foam - Lift dressing to assess site every shift 03/17/23 2000    DVT prophylaxis: heparin injection 5,000 Units Start: 03/16/23 2200 Code Status:   Code Status: Full Code Family Communication: plan of care discussed with patient/daughter at bedside. Patient status is: Remains hospitalized because of severity of illness Level of care: Progressive   Dispo: The patient is from: NH            Anticipated disposition: NH in 1 day Objective: Vitals last 24 hrs: Vitals:   03/18/23 0011 03/18/23 0201 03/18/23 0430 03/18/23 0908  BP:  (!) 142/62    Pulse:  82    Resp:  16    Temp: (!) 97.2 F (36.2 C)  98.1 F (36.7 C) 98 F (36.7 C)  TempSrc: Axillary  Oral Oral  SpO2:  100%    Weight:      Height:       Weight change:   Physical Examination: General exam: alert awake,at baseline, older than stated age HEENT:Oral mucosa moist, Ear/Nose WNL grossly Respiratory system: Bilaterally clear BS,no use of accessory muscle Cardiovascular system: S1 & S2 +, No JVD. Gastrointestinal system: Abdomen soft,NT,ND, BS+ Nervous System: Alert, awake, moving all  UE, b/l Amputation Extremities: LE edema neg,distal peripheral pulses palpable and warm.  Skin: No rashes,no icterus. MSK: Normal muscle bulk,tone, power   Medications reviewed:  Scheduled Meds:  aspirin EC  81 mg Oral Daily   atorvastatin  10 mg Oral Daily   azithromycin  500 mg Oral Daily   famotidine  20 mg Oral Daily   heparin  5,000 Units Subcutaneous Q8H   heparin sodium (porcine)  4,200 Units Intracatheter Once   hydrocortisone sod succinate (SOLU-CORTEF) inj  50 mg Intravenous Q6H   levothyroxine  100 mcg Oral Daily   midodrine  10 mg Oral Q M,W,F-HD   midodrine  5 mg Oral TID WC   pantoprazole  40 mg Oral QHS   senna  2 tablet Oral Daily   Continuous Infusions:  cefTRIAXone (ROCEPHIN)  IV Stopped (03/17/23 6213)    Diet Order              Diet renal/carb modified with fluid restriction Diet-HS Snack? Nothing; Fluid restriction: 1200 mL Fluid; Room service appropriate? Yes; Fluid consistency: Thin  Diet effective now                   Intake/Output Summary (Last 24 hours) at 03/18/2023 1020 Last data filed at 03/17/2023 1818 Gross per 24 hour  Intake 300 ml  Output 2000 ml  Net -1700 ml   Net IO Since Admission: -676 mL [03/18/23 1020]  Wt Readings from Last 3 Encounters:  12/02/22 117.9 kg  11/13/22 120.9 kg  09/09/22 116.6 kg     Unresulted Labs (From admission, onward)     Start     Ordered   03/18/23 0500  Basic metabolic panel  Daily,   R     Question:  Specimen collection method  Answer:  IV Team=IV Team collect   03/17/23 0929   03/18/23 0500  CBC  Daily,   R     Question:  Specimen collection method  Answer:  IV Team=IV Team collect   03/17/23 [phone]  03/18/23 0500  Magnesium  Daily,   R     Question:  Specimen collection method  Answer:  IV Team=IV Team collect   03/17/23 0929   03/16/23 2138  Expectorated Sputum Assessment w Gram Stain, Rflx to Resp Cult  Once,   R        03/16/23 2138   03/16/23 2138  Legionella Pneumophila Serogp 1 Ur Ag  Once,   R        03/16/23 2138   03/16/23 2138  Strep pneumoniae urinary antigen  Once,   R        03/16/23 2138   03/16/23 2137  Blood gas, arterial  Once,   R        03/16/23 2138   03/16/23 2136  CBC  (heparin)  Once,   R       Comments: Baseline for heparin therapy IF NOT ALREADY DRAWN.  Notify MD if PLT < 100 K.    03/16/23 2138   03/16/23 2127  C-peptide  Once,   R        03/16/23 2126          Data Reviewed: I have personally reviewed following labs and imaging studies CBC: Recent Labs  Lab 03/16/23 1055 03/16/23 2208 03/16/23 2259 03/17/23 1430 03/18/23 0745  WBC 10.2  --  12.4* 15.3* 12.6*  NEUTROABS  --   --  9.6*  --   --   HGB 10.8* 10.9* 10.1* 10.1* 10.5*  HCT 35.7* 32.0* 33.9* 33.1* 33.6*  MCV 98.1  --  100.0 98.5 96.3  PLT  229  --  220 226 219   Basic Metabolic Panel:  Recent Labs  Lab 03/16/23 1140 03/16/23 2103 03/16/23 2208 03/16/23 2259 03/17/23 1430 03/18/23 0745  NA 135 134* 131* 133* 133* 131*  K 5.5* 5.4* 5.6* 5.3* 6.0* 4.8  CL 95* 95*  --  95* 96* 94*  CO2 19* 21*  --  20* 20* 21*  GLUCOSE 61* 87  --  132* 119* 112*  BUN 62* 63*  --  63* 65* 40*  CREATININE 13.59* 12.88*  --  12.88* 14.09* 8.68*  CALCIUM 9.7 9.2  --  9.1 9.4 10.0  MG  --   --   --   --   --  2.2  PHOS  --   --   --   --   --  7.3*   GFR: Estimated Creatinine Clearance: 6.5 mL/min (A) (by C-G formula based on SCr of 8.68 mg/dL (H)). Liver Function Tests:  Recent Labs  Lab 03/16/23 1140 03/16/23 2259 03/17/23 1430  AST 20 19 16   ALT 17 17 15   ALKPHOS 74 63 63  BILITOT 1.0 0.8 0.7  PROT 7.4 6.7 7.2  ALBUMIN 3.2* 2.9* 3.4*   No results for input(s): "LIPASE", "AMYLASE" in the last 168 hours.  Recent Labs  Lab 03/16/23 2259  AMMONIA 32   Coagulation Profile: No results for input(s): "INR", "PROTIME" in the last 168 hours. No results for input(s): "PROBNP" in the last 168 hours.  Recent Labs    03/16/23 2259  HGBA1C 5.1   Recent Labs  Lab 03/17/23 0737 03/17/23 0959 03/17/23 1209 03/17/23 1945 03/18/23 0943  GLUCAP 83 106* 81 91 135*   No results for input(s): "CHOL", "HDL", "LDLCALC", "TRIG", "CHOLHDL", "LDLDIRECT" in the last 72 hours. Recent Labs    03/16/23 2259  TSH 1.932   Sepsis Labs: Recent Labs  Lab 03/16/23 1038 03/16/23 2259 03/17/23  0040 03/17/23 0153  PROCALCITON  --  0.39  --   --   LATICACIDVEN 1.3  --  1.2 1.2   Recent Results (from the past 240 hour(s))  Resp panel by RT-PCR (RSV, Flu A&B, Covid) Anterior Nasal Swab     Status: None   Collection Time: 03/16/23 10:23 AM   Specimen: Anterior Nasal Swab  Result Value Ref Range Status   SARS Coronavirus 2 by RT PCR NEGATIVE NEGATIVE Final   Influenza A by PCR NEGATIVE NEGATIVE Final   Influenza B by PCR NEGATIVE NEGATIVE  Final    Comment: (NOTE) The Xpert Xpress SARS-CoV-2/FLU/RSV plus assay is intended as an aid in the diagnosis of influenza from Nasopharyngeal swab specimens and should not be used as a sole basis for treatment. Nasal washings and aspirates are unacceptable for Xpert Xpress SARS-CoV-2/FLU/RSV testing.  Fact Sheet for Patients: BloggerCourse.com  Fact Sheet for Healthcare Providers: SeriousBroker.it  This test is not yet approved or cleared by the [place] FDA and has been authorized for detection and/or diagnosis of SARS-CoV-2 by FDA under an Emergency Use Authorization (EUA). This EUA will remain in effect (meaning this test can be used) for the duration of the COVID-19 declaration under Section 564(b)(1) of the Act, 21 U.S.C. section 360bbb-3(b)(1), unless the authorization is terminated or revoked.     Resp Syncytial Virus by PCR NEGATIVE NEGATIVE Final    Comment: (NOTE) Fact Sheet for Patients: BloggerCourse.com  Fact Sheet for Healthcare Providers: SeriousBroker.it  This test is not yet approved or cleared by the [place] FDA and has been authorized for detection and/or diagnosis of SARS-CoV-2 by FDA under an Emergency Use Authorization (EUA). This EUA will remain in effect (meaning this test can be used) for the duration of the COVID-19 declaration under Section 564(b)(1) of the Act, 21 U.S.C. section 360bbb-3(b)(1), unless the authorization is terminated or revoked.  Performed at [facility], [address], [place], [place] [address]   Culture, blood (Routine X 2) w Reflex to ID Panel     Status: None (Preliminary result)   Collection Time: 03/17/23  1:53 AM   Specimen: BLOOD LEFT HAND  Result Value Ref Range Status   Specimen Description BLOOD LEFT HAND  Final   Special Requests   Final    BOTTLES DRAWN AEROBIC AND ANAEROBIC Blood Culture  results may not be optimal due to an inadequate volume of blood received in culture bottles   Culture   Final    NO GROWTH 1 DAY Performed at [facility], [address] [place], [place] [address]    Report Status PENDING  Incomplete  MRSA Next Gen by PCR, Nasal     Status: None   Collection Time: 03/17/23  9:25 PM   Specimen: Nasal Mucosa; Nasal Swab  Result Value Ref Range Status   MRSA by PCR Next Gen NOT DETECTED NOT DETECTED Final    Comment: (NOTE) The GeneXpert MRSA Assay (FDA approved for NASAL specimens only), is one component of a comprehensive MRSA colonization surveillance program. It is not intended to diagnose MRSA infection nor to guide or monitor treatment for MRSA infections. Test performance is not FDA approved in patients less than 56 years old. Performed at [facility], [address], [place], [place] [address]     Antimicrobials/Microbiology: Anti-infectives (From admission, onward)    Start     Dose/Rate Route Frequency Ordered Stop   03/17/23 1000  azithromycin (ZITHROMAX) 500 mg  in sodium chloride 0.9 % 250 mL IVPB  Status:  Discontinued        500 mg 250 mL/hr over 60 Minutes Intravenous Every 24 hours 03/17/23 0201 03/17/23 0801   03/17/23 1000  cefTRIAXone (ROCEPHIN) 1 g in sodium chloride 0.9 % 100 mL IVPB        1 g 200 mL/hr over 30 Minutes Intravenous Every 24 hours 03/17/23 0201     03/17/23 1000  azithromycin (ZITHROMAX) tablet 500 mg        500 mg Oral Daily 03/17/23 0801     03/16/23 1115  cefTRIAXone (ROCEPHIN) 1 g in sodium chloride 0.9 % 100 mL IVPB        1 g 200 mL/hr over 30 Minutes Intravenous  Once 03/16/23 1102 03/16/23 1314   03/16/23 1115  azithromycin (ZITHROMAX) 500 mg in sodium chloride 0.9 % 250 mL IVPB        500 mg 250 mL/hr over 60 Minutes Intravenous  Once 03/16/23 1102 03/16/23 1433         Component Value Date/Time   SDES BLOOD LEFT HAND 03/17/2023 0153   SPECREQUEST  03/17/2023 0153    BOTTLES DRAWN  AEROBIC AND ANAEROBIC Blood Culture results may not be optimal due to an inadequate volume of blood received in culture bottles   CULT  03/17/2023 0153    NO GROWTH 1 DAY Performed at [facility], [address] [place], [place] [address]    REPTSTATUS PENDING 03/17/2023 0153     Radiology Studies: CT CHEST WO CONTRAST  Result Date: 03/17/2023 CLINICAL DATA:  Left-greater-than-right basilar airspace opacities on chest x-ray today, sent to ED with altered mental status and weakness. EXAM: CT CHEST WITHOUT CONTRAST TECHNIQUE: Multidetector CT imaging of the chest was performed following the standard protocol without IV contrast. RADIATION DOSE REDUCTION: This exam was performed according to the departmental dose-optimization program which includes automated exposure control, adjustment of the mA and/or kV according to patient size and/or use of iterative reconstruction technique. COMPARISON:  Portable chest today, portable chest 11/05/2022, CTA chest 09/09/2022, CTA chest 09/12/2021. FINDINGS: Cardiovascular: The heart is moderately enlarged, or patent chamber involvement and heavy three-vessel calcific CAD. Left IJ approach hemodialysis catheter again terminates in the right atrium. There is no substantial pericardial effusion. There is a mass with patchy calcifications in the right atrium laterally measuring 4.8 x 3.7 cm. It is unchanged in size since 09/12/2021 and was obscured by right atrial contrast on the last CTA. The mass has become progressively calcified since 2023 and probably is a dialysis catheter-related partially calcified thrombus. If further evaluation is needed it would be best done with either echocardiography or cardiac MRI. Calcified linear thrombus extends along the course of the hemodialysis catheter in the SVC. The pulmonary trunk is 3.2 cm indicating arterial hypertension. Pulmonary veins are nondistended. The thoracic aorta is heavily calcified with calcification  extending across the valve leaflets and mild tortuosity. There is no aortic aneurysm. There are calcifications in the great vessels. Calcifications at the origins of the left subclavian and left common carotid arteries likely causing flow-limiting origin stenoses with additional heavy calcification and likely stenosis at the right subclavian artery origin. Mediastinum/Nodes: Single slightly prominent right paratracheal space lymph node 1.1 cm in short axis. No new or progressive or further adenopathy is seen. There is no thyroid mass. Thoracic esophagus and thoracic trachea are unremarkable. Lungs/Pleura: There is a mildly, chronically elevated right hemidiaphragm. No pleural effusion, thickening or pneumothorax.  There is mild bronchial thickening. Chronic scarring in the right middle lobe base, lateral basal right lower lobe. There is increased faint ground-glass opacity in the upper and lower lobes, which is probably due to mosaicism and air trapping, less likely pneumonitis. No focal consolidation is seen, no pulmonary nodule. Upper Abdomen: Aortic and branch vessel atherosclerosis. No acute findings. Musculoskeletal: Degenerative changes and slight dextroscoliosis thoracic spine. Dense bones consistent with renal osteodystrophy. No chest wall mass is seen. IMPRESSION: 1. Increased faint ground-glass opacity in the upper and lower lobes, probably due to mosaicism and air trapping, less likely pneumonitis. 2. Mild bronchial thickening. 3. Cardiomegaly with heavy three-vessel calcific CAD. 4. 4.8 x 3.7 cm mass with patchy calcifications in the right atrium, unchanged in size since 09/12/2021 and probably a dialysis catheter-related partially calcified thrombus. If further evaluation is needed it would be best done with either echocardiography or cardiac MRI. 5. Calcified linear thrombus extends along the course of the hemodialysis catheter in the SVC. 6. Aortic and branch vessel atherosclerosis. Likely  flow-limiting stenoses of both subclavian arteries, left common carotid artery. 7. Single slightly prominent right paratracheal space lymph node. No new or progressive adenopathy. 8. Dense bones consistent with renal osteodystrophy. Aortic Atherosclerosis (ICD10-I70.0). Electronically Signed   By: Almira Bar M.D.   On: 03/17/2023 01:10   CT Head Wo Contrast  Result Date: 03/16/2023 CLINICAL DATA:  Mental status change, unknown cause. Speech disturbance. Lethargy/weakness. EXAM: CT HEAD WITHOUT CONTRAST TECHNIQUE: Contiguous axial images were obtained from the base of the skull through the vertex without intravenous contrast. RADIATION DOSE REDUCTION: This exam was performed according to the departmental dose-optimization program which includes automated exposure control, adjustment of the mA and/or kV according to patient size and/or use of iterative reconstruction technique. COMPARISON:  Head CT 09/27/2021 FINDINGS: Brain: There is no evidence of an acute infarct, intracranial hemorrhage, mass, midline shift, or extra-axial fluid collection. The ventricles and sulci are normal for age. Vascular: Calcified atherosclerosis at the skull base. Unchanged small calcification posteriorly in the right sylvian fissure, possibly a chronically calcified embolus within a distal right MCA branch vessel. Skull: No acute fracture or suspicious osseous lesion. Sinuses/Orbits: Partially visualized mild mucosal thickening in the maxillary sinuses. Clear mastoid air cells. Unremarkable orbits. Other: None. IMPRESSION: No evidence of acute intracranial abnormality. Electronically Signed   By: Sebastian Ache M.D.   On: 03/16/2023 14:27   DG Chest Portable 1 View  Result Date: 03/16/2023 CLINICAL DATA:  Shortness of breath. EXAM: PORTABLE CHEST 1 VIEW COMPARISON:  Radiographs 11/05/2022 and 09/08/2022.  CT 09/09/2022. FINDINGS: 1039 hours. The left lung base is partially excluded from this portable examination. Allowing for  this, the heart size and mediastinal contours are stable with cardiomegaly, aortic atherosclerosis and stable widening of the superior mediastinum. Left IJ hemodialysis catheter extends to the mid right atrium, stable. Lower lung volumes with new patchy left greater than right basilar airspace opacities. No large pleural effusion or pneumothorax identified. The bones appear unchanged. IMPRESSION: Lower lung volumes with new patchy left greater than right basilar airspace opacities, likely atelectasis. Left lower lobe pneumonia not excluded. Radiographic follow-up recommended. Electronically Signed   By: Carey Bullocks M.D.   On: 03/16/2023 10:56     LOS: 2 days   Total time spent in review of labs and imaging, patient evaluation, formulation of plan, documentation and communication with family: 25 minutes  Lanae Boast, MD [facility]  03/18/2023, 10:20 AM

## 2023-03-19 ENCOUNTER — Inpatient Hospital Stay (HOSPITAL_COMMUNITY): Payer: 59

## 2023-03-19 ENCOUNTER — Other Ambulatory Visit (HOSPITAL_COMMUNITY): Payer: 59

## 2023-03-19 DIAGNOSIS — I950 Idiopathic hypotension: Secondary | ICD-10-CM

## 2023-03-19 DIAGNOSIS — N186 End stage renal disease: Secondary | ICD-10-CM | POA: Diagnosis not present

## 2023-03-19 DIAGNOSIS — Z992 Dependence on renal dialysis: Secondary | ICD-10-CM | POA: Diagnosis not present

## 2023-03-19 DIAGNOSIS — I998 Other disorder of circulatory system: Secondary | ICD-10-CM | POA: Diagnosis not present

## 2023-03-19 LAB — BASIC METABOLIC PANEL
Anion gap: 14 (ref 5–15)
BUN: 56 mg/dL — ABNORMAL HIGH (ref 6–20)
CO2: 24 mmol/L (ref 22–32)
Calcium: 8.9 mg/dL (ref 8.9–10.3)
Chloride: 94 mmol/L — ABNORMAL LOW (ref 98–111)
Creatinine, Ser: 10.82 mg/dL — ABNORMAL HIGH (ref 0.44–1.00)
GFR, Estimated: 4 mL/min — ABNORMAL LOW (ref >60)
Glucose, Bld: 82 mg/dL (ref 70–99)
Potassium: 4.8 mmol/L (ref 3.5–5.1)
Sodium: 132 mmol/L — ABNORMAL LOW (ref 135–145)

## 2023-03-19 LAB — CBC WITH DIFFERENTIAL/PLATELET
Abs Immature Granulocytes: 0.06 10*3/uL (ref 0.00–0.07)
Basophils Absolute: 0.1 10*3/uL (ref 0.0–0.1)
Basophils Relative: 1 %
Eosinophils Absolute: 0.5 10*3/uL (ref 0.0–0.5)
Eosinophils Relative: 5 %
HCT: 33 % — ABNORMAL LOW (ref 36.0–46.0)
Hemoglobin: 10 g/dL — ABNORMAL LOW (ref 12.0–15.0)
Immature Granulocytes: 1 %
Lymphocytes Relative: 21 %
Lymphs Abs: 2.2 10*3/uL (ref 0.7–4.0)
MCH: 29.7 pg (ref 26.0–34.0)
MCHC: 30.3 g/dL (ref 30.0–36.0)
MCV: 97.9 fL (ref 80.0–100.0)
Monocytes Absolute: 0.7 10*3/uL (ref 0.1–1.0)
Monocytes Relative: 7 %
Neutro Abs: 6.9 10*3/uL (ref 1.7–7.7)
Neutrophils Relative %: 65 %
Platelets: 208 10*3/uL (ref 150–400)
RBC: 3.37 MIL/uL — ABNORMAL LOW (ref 3.87–5.11)
RDW: 19.3 % — ABNORMAL HIGH (ref 11.5–15.5)
WBC: 10.3 10*3/uL (ref 4.0–10.5)
nRBC: 0.2 % (ref 0.0–0.2)

## 2023-03-19 MED ORDER — HEPARIN SODIUM (PORCINE) 1000 UNIT/ML IJ SOLN
1000.0000 [IU] | Freq: Once | INTRAMUSCULAR | Status: AC
  Administered 2023-03-19: 1000 [IU] via INTRAVENOUS

## 2023-03-19 MED ORDER — OXYCODONE-ACETAMINOPHEN 5-325 MG PO TABS
1.0000 | ORAL_TABLET | Freq: Three times a day (TID) | ORAL | Status: DC | PRN
  Administered 2023-03-19 – 2023-03-20 (×4): 1 via ORAL
  Filled 2023-03-19 (×4): qty 1
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
## 2023-03-19 NOTE — Plan of Care (Signed)
  Problem: Health Behavior/Discharge Planning: Goal: Ability to manage health-related needs will improve Outcome: Not Progressing  Patient refusing nursing care, medications, wound care. Education and encouragement provided. However, patient continues to refuse
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
## 2023-03-19 NOTE — Consult Note (Addendum)
Hospital Consult    Reason for Consult:  ischemic right fingertips Requesting Physician:  Rogers Blocker, PA MRN #:  [id number]  History of Present Illness: Courtney Gray is a 52 y.o. female with a past medical history of ESRD on HD, NSTEMI, obesity, chronic heart failure, DMII, calciphylaxis, HTN, chronic hypoxia, and PAD s/p bilateral AKA's.  She was admitted to [facility] on 12/10 with fatigue and acute change in mental status.  She has been having frequent hypoglycemic episodes with encephalopathy. CXR also concerning for pneumonia.  Workup is ongoing for acute encephalopathy.  Likely multifactorial due to possible pneumonia, hypoglycemic episodes, and multiple sedative medications.  She was started on Rocephin and Azithromycin on admission.  We were consulted for concerns of ischemic fingertips to the right hand.  This patient is well-known to our practice.  She has a history of bilateral lower extremity gangrene, requiring bilateral AKA's.   The patient states a couple of weeks ago she started experiencing worsening pain in her right pointer and middle fingers.  This pain has been constant and worsening since its onset.  She tries to hold her fingers for pain relief.  She has noticed these fingers developing a dark discoloration since her pain onset.  She states the area of dry gangrene at the tip of her right pointer finger has been present for about a week.  She also endorses occasional numbness to the entire right hand which goes away after movement.  She denies any symptoms of excessive hand coldness or noticeable weakness.   Past Medical History:  Diagnosis Date   Arthritis    Bacteremia    Calciphylaxis    Chronic HFrEF (heart failure with reduced ejection fraction) (HCC)    Diabetes mellitus without complication (HCC)    Dialysis complication, subsequent encounter    ESRD (end stage renal disease) (HCC)    Gout    High cholesterol    Hypertension    Hypertensive  emergency    Ischemic foot pain at rest 08/03/2021   Renal disorder    Right atrial mass    Severe mitral regurgitation     Past Surgical History:  Procedure Laterality Date   ABDOMINAL AORTOGRAM W/LOWER EXTREMITY N/A 08/07/2021   Procedure: ABDOMINAL AORTOGRAM W/LOWER EXTREMITY;  Surgeon: Victorino Sparrow, MD;  Location: [facility] INVASIVE CV LAB;  Service: Cardiovascular;  Laterality: N/A;   AMPUTATION Right 08/14/2021   Procedure: AMPUTATION ABOVE KNEE;  Surgeon: Cephus Shelling, MD;  Location: [facility] OR;  Service: Vascular;  Laterality: Right;   AMPUTATION Left 09/23/2021   Procedure: AMPUTATION MIDDLE FINGER;  Surgeon: Marlyne Beards, MD;  Location: [facility] OR;  Service: Orthopedics;  Laterality: Left;   AMPUTATION Left 10/09/2021   Procedure: LEFT ABOVE KNEE AMPUTATION;  Surgeon: Leonie Douglas, MD;  Location: [facility] OR;  Service: Vascular;  Laterality: Left;   AV FISTULA PLACEMENT     x2, unsuccessful.    BUBBLE STUDY  09/27/2021   Procedure: BUBBLE STUDY;  Surgeon: Lewayne Bunting, MD;  Location: [facility] ENDOSCOPY;  Service: Cardiovascular;;   IR FLUORO GUIDE CV LINE LEFT  12/14/2020   IR FLUORO GUIDE CV LINE LEFT  02/20/2021   IR FLUORO GUIDE CV LINE LEFT  09/11/2021   IR FLUORO GUIDE CV LINE LEFT  10/18/2021   IR FLUORO GUIDE CV LINE LEFT  11/26/2021   IR FLUORO GUIDE CV LINE LEFT  11/28/2021   IR FLUORO GUIDE CV LINE LEFT  02/04/2022   IR FLUORO GUIDE CV LINE LEFT  03/07/2022   IR FLUORO GUIDE CV LINE RIGHT  08/28/2020   IR FLUORO GUIDE CV LINE RIGHT  05/13/2021   IR FLUORO GUIDE CV LINE RIGHT  05/24/2021   IR INTRAVASCULAR ULTRASOUND NON CORONARY  03/07/2022   IR PERC TUN PERIT CATH WO PORT S&I /IMAG  09/06/2020   IR PTA VENOUS EXCEPT DIALYSIS CIRCUIT  02/20/2021   IR PTA VENOUS EXCEPT DIALYSIS CIRCUIT  05/13/2021   IR PTA VENOUS EXCEPT DIALYSIS CIRCUIT  11/28/2021   IR PTA VENOUS EXCEPT DIALYSIS CIRCUIT  03/07/2022   IR REMOVAL TUN CV CATH W/O FL  05/24/2021   IR REMOVAL TUN CV CATH W/O FL  10/15/2021    IR US GUIDE VASC ACCESS LEFT  09/06/2020   IR US GUIDE VASC ACCESS LEFT  09/11/2021   IR US GUIDE VASC ACCESS LEFT  10/18/2021   IR US GUIDE VASC ACCESS RIGHT  05/24/2021   IR US GUIDE VASC ACCESS RIGHT  09/10/2021   IR VENO/JUGULAR RIGHT  09/10/2021   IR VENOCAVAGRAM SVC  02/20/2021   IR VENOCAVAGRAM SVC  11/26/2021   IR VENOCAVAGRAM SVC  03/07/2022   TEE WITHOUT CARDIOVERSION N/A 09/27/2021   Procedure: TRANSESOPHAGEAL ECHOCARDIOGRAM (TEE);  Surgeon: Lewayne Bunting, MD;  Location: [facility] ENDOSCOPY;  Service: Cardiovascular;  Laterality: N/A;   TEE WITHOUT CARDIOVERSION N/A 11/11/2022   Procedure: TRANSESOPHAGEAL ECHOCARDIOGRAM;  Surgeon: Quintella Reichert, MD;  Location: [facility] INVASIVE CV LAB;  Service: Cardiovascular;  Laterality: N/A;   UPPER EXTREMITY VENOGRAPHY Right 12/04/2021   Procedure: UPPER EXTREMITY VENOGRAPHY;  Surgeon: Victorino Sparrow, MD;  Location: [facility] INVASIVE CV LAB;  Service: Cardiovascular;  Laterality: Right;    Allergies  Allergen Reactions   Benzonatate Other (See Comments)    Hallucinations   Hydralazine Other (See Comments)    Hallucinations   Amoxicillin Itching and Nausea And Vomiting   Clonidine Other (See Comments)    Altered mental state, insomnia    Chlorhexidine Itching    Patient reports it is the CHG baths that cause her itching not the CHG used for caring for her IV/HD access.     Morphine Itching    Prior to Admission medications   Medication Sig Start Date End Date Taking? Authorizing Provider  aspirin EC 81 MG tablet Take 1 tablet (81 mg total) by mouth daily. Swallow whole. 05/16/22  Yes [place], Eric J, DO  atorvastatin (LIPITOR) 10 MG tablet Take 1 tablet (10 mg total) by mouth daily. 11/28/20  Yes Emokpae, Courage, MD  cephALEXin (KEFLEX) 500 MG capsule Take 1 capsule (500 mg total) by mouth 3 (three) times daily. 03/07/23  Yes Arby Barrette, MD  cyclobenzaprine (FLEXERIL) 10 MG tablet Take 1 tablet (10 mg total) by mouth 2 (two) times daily as needed for  muscle spasms. 09/16/22  Yes Netta Corrigan, PA-C  Darbepoetin Alfa (ARANESP) 200 MCG/0.4ML SOSY injection Inject 200 mcg into the skin once a week. Every Tuesday   Yes [provider]  dicyclomine (BENTYL) 10 MG capsule Take 10 mg by mouth in the morning, at noon, and at bedtime. 11/04/22  Yes [provider]  docusate sodium (COLACE) 100 MG capsule Take 100 mg by mouth 2 (two) times daily.   Yes [provider]  famotidine (PEPCID) 20 MG tablet Take 1 tablet (20 mg total) by mouth daily. 05/16/22  Yes [place], Eric J, DO  fentaNYL (DURAGESIC) 25 MCG/HR Place 1 patch onto the skin every 3 (three) days.   Yes [provider]  gabapentin (NEURONTIN) 300 MG capsule Take 300 mg by mouth 3 (three) times daily.   Yes [provider]  guaiFENesin (ROBITUSSIN) 100 MG/5ML liquid Take 100 mg by mouth every 4 (four) hours as needed for cough.   Yes [provider]  HYDROcodone-acetaminophen (NORCO/VICODIN) 5-325 MG tablet Take 1 tablet by mouth in the morning, at noon, in the evening, and at bedtime.   Yes [provider]  levothyroxine (SYNTHROID) 100 MCG tablet Take 100 mcg by mouth daily.   Yes [provider]  lidocaine (LIDODERM) 5 % Place 1 patch onto the skin daily. Remove & Discard patch within 12 hours or as directed by MD   Yes [provider]  lidocaine (XYLOCAINE) 5 % ointment Apply 1 Application topically See admin instructions. 1 application every 2 hours as needed for pain   Yes [provider]  midodrine (PROAMATINE) 10 MG tablet Take 1 tablet (10 mg total) by mouth every Monday, Wednesday, and Friday at 8 PM. Before hemodialysis Patient taking differently: Take 10 mg by mouth See admin instructions. Take 10 mg by mouth every Monday, Wednesday, and Friday before hemodialysis 05/16/22  Yes [place], Alvira Philips, DO  nutrition supplement, JUVEN, (JUVEN) PACK Take 1 packet by mouth 2 (two) times daily between meals.    Yes [provider]  oxyCODONE-acetaminophen (PERCOCET/ROXICET) 5-325 MG tablet Take 1 tablet by mouth every 6 (six) hours as needed for moderate pain. 10/30/22  Yes [provider]  OXYGEN Inhale 4 L into the lungs See admin instructions. Oxygen at 4L hr via nasal cannula and will use portable oxygen machine when up in wheelchair. 10/28/21  Yes [provider]  pantoprazole (PROTONIX) 40 MG tablet Take 1 tablet (40 mg total) by mouth daily. 11/29/20  Yes Shon Hale, MD  Patiromer Sorbitex Calcium (VELTASSA) 16.8 g PACK Take 16.8 g by mouth daily.   Yes [provider]  pregabalin (LYRICA) 75 MG capsule Take 75 mg by mouth 3 (three) times daily.   Yes [provider]  senna (SENOKOT) 8.6 MG TABS tablet Take 2 tablets (17.2 mg total) by mouth daily. 11/13/22  Yes Monna Fam, MD  simethicone (MYLICON) 80 MG chewable tablet Chew 80 mg by mouth every 6 (six) hours as needed for flatulence.   Yes [provider]  sucroferric oxyhydroxide (VELPHORO) 500 MG chewable tablet Chew 500 mg by mouth 3 (three) times daily with meals.   Yes [provider]    Social History   Socioeconomic History   Marital status: Single    Spouse name: Not on file   Number of children: Not on file   Years of education: Not on file   Highest education level: Not on file  Occupational History   Not on file  Tobacco Use   Smoking status: Never   Smokeless tobacco: Never  Vaping Use   Vaping status: Never Used  Substance and Sexual Activity   Alcohol use: Never   Drug use: Never   Sexual activity: Not on file  Other Topics Concern   Not on file  Social History Narrative   Not on file   Social Drivers of Health   Financial Resource Strain: High Risk (03/27/2018)   Received from [facility], [facility]   Overall Financial Resource Strain (CARDIA)    Difficulty of Paying Living Expenses: Very hard  Food Insecurity: No Food Insecurity  (03/16/2023)   Hunger Vital Sign    Worried About Running Out of  Food in the Last Year: Never true    Ran Out of Food in the Last Year: Never true  Transportation Needs: No Transportation Needs (03/16/2023)   PRAPARE - Administrator, Civil Service (Medical): No    Lack of Transportation (Non-Medical): No  Physical Activity: Not on file  Stress: Not on file  Social Connections: Unknown (08/20/2021)   Received from [facility], [facility]   Social Network    Social Network: Not on file  Intimate Partner Violence: Not At Risk (03/16/2023)   Humiliation, Afraid, Rape, and Kick questionnaire    Fear of Current or Ex-Partner: No    Emotionally Abused: No    Physically Abused: No    Sexually Abused: No     Family History  Problem Relation Age of Onset   Diabetes Mother    Diabetes Father     ROS: Otherwise negative unless mentioned in HPI  Physical Examination  Vitals:   03/19/23 0300 03/19/23 0833  BP:    Pulse: 66   Resp: 14   Temp:  97.8 F (36.6 C)  SpO2: 99%    Body mass index is 91.66 kg/m.  General:  chronically ill appearing female, somnolent but talkative Gait: Not observed HENT: WNL, normocephalic Pulmonary: normal non-labored breathing, on nasal cannula Cardiac: regular Abdomen:  soft, NT/ND, no masses Skin: without rashes Vascular Exam/Pulses: nonpalpable radial pulses bilaterally, unable to palpate brachial pulses d/t body habitus Extremities: bilateral AKAs well healed. Left pointer finger with ulcer, possibly due to calciphylaxis. Ischemic appearing right pointer and middle fingers, worse on the pointer finger with dry gangrene at fingertip. Neurologic: somnolent but oriented to self, place, and time Psychiatric:  The pt has Normal affect.        CBC    Component Value Date/Time   WBC 12.6 (H) 03/18/2023 0745   RBC 3.49 (L) 03/18/2023 0745   HGB 10.5 (L) 03/18/2023 0745   HCT 33.6 (L) 03/18/2023 0745   PLT 219 03/18/2023 0745    MCV 96.3 03/18/2023 0745   MCH 30.1 03/18/2023 0745   MCHC 31.3 03/18/2023 0745   RDW 18.8 (H) 03/18/2023 0745   LYMPHSABS 1.0 03/16/2023 2259   MONOABS 1.2 (H) 03/16/2023 2259   EOSABS 0.5 03/16/2023 2259   BASOSABS 0.1 03/16/2023 2259    BMET    Component Value Date/Time   NA 131 (L) 03/18/2023 0745   K 4.8 03/18/2023 0745   CL 94 (L) 03/18/2023 0745   CO2 21 (L) 03/18/2023 0745   GLUCOSE 112 (H) 03/18/2023 0745   BUN 40 (H) 03/18/2023 0745   CREATININE 8.68 (H) 03/18/2023 0745   CALCIUM 10.0 03/18/2023 0745   GFRNONAA 5 (L) 03/18/2023 0745   GFRAA 6 (L) 11/29/2019 0120    COAGS: Lab Results  Component Value Date   INR 1.1 09/09/2022   INR 1.3 (H) 05/25/2022   INR 1.3 (H) 10/09/2021     Non-Invasive Vascular Imaging:   Bilateral Upper Extremity Arterial Duplex Pending  Statin:  Yes.   Beta Blocker:  No. Aspirin:  Yes.   ACEI:  No. ARB:  No. CCB use:  No Other antiplatelets/anticoagulants:  No.    ASSESSMENT/PLAN: This is a 52 y.o. female admitted for weakness and encephalopathy   -The patient was admitted to the hospital two days ago with new onset fatigue and altered mental status.  She was found to be encephalopathic, which is likely multifactorial in nature.  She has had several hypoglycemic events recently.  She has also been using several sedative medications and has possible pneumonia -We were consulted for possible right fingertip ischemia. She does have a history of PAD and has required bilateral AKAs -Over the past few weeks she has been experiencing worsening pain in her right pointer and middle fingers. She has also noticed discoloration to these fingers. On my exam these two fingers appear ischemic, with the pointer finger being worse. Her hands have intact motor and are not cool to touch. She does not have palpable radial or ulnar pulses. I am unable to palpate brachial pulses due to body habitus.  -The patient's right digit ischemia is likely  chronic in nature. We will obtain upper extremity arterial duplexes for further workup. The patient also likely needs a right upper extremity angiogram. She is aware that she may not have any treatable stenosis. Potentially her ischemia is due to worsening small vessel disease in the hand. She is aware she may end up with at least partial amputation of these fingers. -She is agreeable to further workup. Studies are pending. Dr.Rodricus Candelaria will evaluate the patient later today and determine timing of angiography   [facility] PA-C [facility] [phone]   VASCULAR STAFF ADDENDUM: I have independently interviewed and examined the patient. I agree with the above.  52 year old female with severe PAD and bilateral AKA's.  Now with approximately 2 weeks wounds and pain in the right fingers.  Explained that she is at high risk for finger amputation. Will follow-up vascular labs when completed. Pending vascular labs will tentatively plan for upper extremity angiography on Monday  Daria Pastures MD [facility] Phone Number: [phone] 03/19/2023 3:54 PM

## 2023-03-19 NOTE — Plan of Care (Signed)
  Problem: Health Behavior/Discharge Planning: Goal: Ability to manage health-related needs will improve Outcome: Not Progressing   Problem: Clinical Measurements: Goal: Ability to maintain clinical measurements within normal limits will improve Outcome: Not Progressing Goal: Will remain free from infection Outcome: Not Progressing Goal: Diagnostic test results will improve Outcome: Not Progressing Goal: Respiratory complications will improve Outcome: Not Progressing Goal: Cardiovascular complication will be avoided Outcome: Not Progressing   Problem: Nutrition: Goal: Adequate nutrition will be maintained Outcome: Not Progressing   Problem: Coping: Goal: Level of anxiety will decrease Outcome: Not Progressing

## 2023-03-19 NOTE — Progress Notes (Signed)
Patient told RN that she does not want to be bother with staff taking her vital signs throughout the night, stating she wants to sleep without interruption. Patient states she does not want anyone to come inside her room and wake her up. RN advised that we can leave her cuff pressure on her to cycle blood pressure every four hours without staff entering into her room. RN encouraged patient to allow this evening vital signs to be obtained for a baseline, which patient is agreeable.   Patient remains refusing for nighttime vital signs and has removed the cuff from her arm. Patient refused her evening medications as well (heparin, Florastor, midodrine). Patient wanted medication information which RN provided a written copy and went over it with patient and her daughter. Patient still refuses the medications. Patient daughter at bedside and is agreeable to patient's wishes. On call MD made aware of patient's refusal on medications and refusal on nursing care and vital signs monitoring. Patient refuses as well for RN to remove her existing wound dressings and for RN to change to new dressings. Patient daughter changed the dressing around 2230pm; RN provided new dressings to patient's daughter.  Patient's daughter would come out of room to get what patient's requests.  Patient appear to be resting with her eyes closed, sitting up in bed, whenever RN peeked in her room.

## 2023-03-19 NOTE — Progress Notes (Addendum)
Received patient in bed to unit.  Alert and oriented.  Informed consent signed and in chart.   TX duration:3.5  Patient tolerated well.  Transported back to the room  Alert, without acute distress.  Hand-off given to patient's nurse.   Access used: Yes Access issues: No  Total UF removed: 2000 Medication(s) given: See MAR Post HD VS: See Below Grid Post HD weight: 122.5 kg   [facility]  03/19/23 1857  Vitals  Temp 98.4 F (36.9 C)  Pulse Rate 77  Resp 17  BP 113/88  SpO2 100 %  O2 Device Nasal Cannula  Oxygen Therapy  O2 Flow Rate (L/min) 2 L/min  Patient Activity (if Appropriate) In bed  Pulse Oximetry Type Continuous  Post Treatment  Dialyzer Clearance Clear  Hemodialysis Intake (mL) 0 mL  Liters Processed 84  Fluid Removed (mL) 2000 mL  Tolerated HD Treatment Yes  Post-Hemodialysis Comments Pt. tolerated HD tx. without difficulties. Report call to 6M Bedside RN, and Admin Medication per order.

## 2023-03-19 NOTE — [place] FL2 (Signed)
[place] MEDICAID FL2 LEVEL OF CARE FORM     IDENTIFICATION  Patient Name: Courtney Gray Birthdate: 1970/08/23 Sex: female Admission Date (Current Location): 03/16/2023  [facility] and [place] Number:  Producer, television/film/video and Address:  [facility] [place], [address] [place], [place] [address]      Provider Number: [id number]  Attending Physician Name and Address:  Lanae Boast, MD  Relative Name and Phone Number:  Piercey,antoinette (Daughter)  [phone] (Mobile)    Current Level of Care: Hospital Recommended Level of Care: Skilled Nursing Facility Prior Approval Number:    Date Approved/Denied:   PASRR Number: [id number]  Discharge Plan: SNF    Current Diagnoses: Patient Active Problem List   Diagnosis Date Noted   Hyperkalemia 03/17/2023   Idiopathic hypotension 03/17/2023   Hypoglycemia 03/16/2023   Right atrial mass 11/10/2022   Pressure injury of right ischium, stage 4 (HCC) 11/07/2022   Elevated troponin 11/06/2022   Severe mitral regurgitation 11/06/2022   Acute exacerbation of CHF (congestive heart failure) (HCC) 11/05/2022   Generalized weakness 11/05/2022   Positive D dimer 09/09/2022   Prolonged QT interval 09/09/2022   Acquired hypothyroidism 09/09/2022   SOB (shortness of breath) 09/08/2022   Open abdominal wall wound 05/25/2022   Hemorrhagic shock (HCC) 05/25/2022   Nodule of skin of abdomen 02/16/2022   Acute on chronic combined systolic and diastolic CHF (congestive heart failure) (HCC) 02/11/2022   Chronic diastolic CHF (congestive heart failure) (HCC) 12/12/2021   PAD (peripheral artery disease) (HCC) 12/12/2021   Irritable bowel syndrome (IBS) 12/12/2021   Amputation of left middle finger 12/12/2021   GERD (gastroesophageal reflux disease) 12/12/2021   Hypertension    DM2 (diabetes mellitus, type 2) (HCC)    Decubitus ulcer of sacral region, stage 3 (HCC)    PVD (peripheral vascular disease) (HCC)    Hepatic abscess     Pressure injury of skin 09/13/2021   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HCC)    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   Mixed diabetic hyperlipidemia associated with type 2 diabetes mellitus (HCC) 09/10/2021   Coronary artery disease involving native coronary artery of native heart without angina pectoris 09/10/2021   Elevated troponin level not due to acute coronary syndrome 09/10/2021   Dry gangrene (HCC)    Pain 08/03/2021   Volume overload 07/09/2021   Acute gout 06/11/2021   Hemorrhoids    HLD (hyperlipidemia) 05/18/2021   Rectal bleeding 05/17/2021   Morbid obesity with BMI of 60.0-69.9, adult (HCC) 05/17/2021   NSTEMI (non-ST elevated myocardial infarction) (HCC) 05/16/2021   Thoracic back pain 04/23/2021   ESRD on dialysis (HCC) 09/01/2020   Chronic respiratory failure with hypoxia (HCC) 09/01/2020   Hypertensive urgency 11/28/2019   History of anemia due to chronic kidney disease 11/28/2019    Orientation RESPIRATION BLADDER Height & Weight     Self, Time, Situation, Place  O2 (3L nasal cannula) Continent Weight:  (patient refuses) Height:  3\' 9"  (114.3 cm)  BEHAVIORAL SYMPTOMS/MOOD NEUROLOGICAL BOWEL NUTRITION STATUS      Continent Diet (See d/c summary)  AMBULATORY STATUS COMMUNICATION OF NEEDS Skin   Extensive Assist Verbally PU Stage and Appropriate Care, Other (Comment) (pressure injury posterior thigh stage 4; irritant dermititis groin; non pressure injuries flank and abdomen)                       Personal Care Assistance Level of Assistance  Bathing, Feeding, Dressing Bathing Assistance: Maximum assistance Feeding  assistance: Independent Dressing Assistance: Maximum assistance     Functional Limitations Info  Sight, Speech, Hearing Sight Info: Impaired Hearing Info: Adequate Speech Info: Adequate    SPECIAL CARE FACTORS FREQUENCY                       Contractures Contractures Info: Not present    Additional Factors Info  Code  Status, Allergies Code Status Info: full code Allergies Info: Benzonatate, morphine, hydrolazine, Amoxicillin, Chlorhexidine, clonidine           Current Medications (03/19/2023):  This is the current hospital active medication list Current Facility-Administered Medications  Medication Dose Route Frequency Provider Last Rate Last Admin   acetaminophen (TYLENOL) tablet 650 mg  650 mg Oral Q6H PRN Lurline Del, MD   650 mg at 03/19/23 1106   Or   acetaminophen (TYLENOL) suppository 650 mg  650 mg Rectal Q6H PRN Lurline Del, MD       aspirin EC tablet 81 mg  81 mg Oral Daily Lurline Del, MD   81 mg at 03/19/23 0940   atorvastatin (LIPITOR) tablet 10 mg  10 mg Oral Daily Lurline Del, MD   10 mg at 03/19/23 0940   azithromycin (ZITHROMAX) tablet 500 mg  500 mg Oral Daily Francena Hanly, RPH   500 mg at 03/18/23 1006   cefTRIAXone (ROCEPHIN) 1 g in sodium chloride 0.9 % 100 mL IVPB  1 g Intravenous Q24H Lurline Del, MD   Stopped at 03/18/23 1614   famotidine (PEPCID) tablet 20 mg  20 mg Oral Daily Skip Mayer A, MD   20 mg at 03/19/23 0940   guaiFENesin (ROBITUSSIN) 100 MG/5ML liquid 5 mL  5 mL Oral Q4H PRN Amin, Ankit C, MD       heparin injection 5,000 Units  5,000 Units Subcutaneous Q8H Skip Mayer A, MD       hydrALAZINE (APRESOLINE) injection 10 mg  10 mg Intravenous Q4H PRN Amin, Ankit C, MD       ipratropium-albuterol (DUONEB) 0.5-2.5 (3) MG/3ML nebulizer solution 3 mL  3 mL Nebulization Q4H PRN Amin, Ankit C, MD       levothyroxine (SYNTHROID) tablet 100 mcg  100 mcg Oral Daily Skip Mayer A, MD   100 mcg at 03/18/23 1610   metoprolol tartrate (LOPRESSOR) injection 5 mg  5 mg Intravenous Q4H PRN Amin, Ankit C, MD       midodrine (PROAMATINE) tablet 10 mg  10 mg Oral Q M,W,F-HD Delano Metz, MD       midodrine (PROAMATINE) tablet 5 mg  5 mg Oral TID WC Sundil, Subrina, MD   5 mg at 03/18/23 1005   ondansetron (ZOFRAN) injection  4 mg  4 mg Intravenous Q6H PRN Amin, Ankit C, MD       oxyCODONE-acetaminophen (PERCOCET/ROXICET) 5-325 MG per tablet 1 tablet  1 tablet Oral Q8H PRN Kc, Ramesh, MD   1 tablet at 03/19/23 1247   pantoprazole (PROTONIX) EC tablet 40 mg  40 mg Oral QHS Francena Hanly, RPH   40 mg at 03/18/23 2023   saccharomyces boulardii (FLORASTOR) capsule 250 mg  250 mg Oral BID Kc, Dayna Barker, MD       senna (SENOKOT) tablet 17.2 mg  2 tablet Oral Daily Skip Mayer A, MD   17.2 mg at 03/19/23 0940   senna-docusate (Senokot-S) tablet 1 tablet  1 tablet Oral QHS PRN Miguel Rota, MD  simethicone (MYLICON) chewable tablet 80 mg  80 mg Oral Q6H PRN Lurline Del, MD       sucroferric oxyhydroxide [facility]) chewable tablet 500 mg  500 mg Oral TID WC Collins, Samantha G, PA-C   500 mg at 03/18/23 1900   traZODone (DESYREL) tablet 50 mg  50 mg Oral QHS PRN Miguel Rota, MD         Discharge Medications: Please see discharge summary for a list of discharge medications.  Relevant Imaging Results:  Relevant Lab Results:   Additional Information SSN-[id number], HD at [facility] on MWF, needs LTC(already has facility medicaid)  Filiberto Wamble Aris Lot, LCSW

## 2023-03-19 NOTE — Progress Notes (Signed)
[facility] Progress Note   Subjective:   Patient seen in room. Reports her shaking is better today. Discussed avoiding lyrica and gabapentin in the future.Denies SOB, CP, dizziness. Fingers on R hand are still painful.   Objective Vitals:   03/18/23 2200 03/18/23 2300 03/19/23 0300 03/19/23 0833  BP:      Pulse: 75 82 66   Resp: (!) 23 18 14    Temp:    97.8 F (36.6 C)  TempSrc:    Oral  SpO2: 99% 97% 99%   Weight:      Height:       Physical Exam General: Alert female in NAD Heart:RRR, no murmurs, rubs or gallops Lungs: CTA bilaterally, respirations unlabored on RA Abdomen: Soft, non-distended, +BS Extremities: B/l AKA, no edema appreciated.  Dialysis Access: [facility]  Additional Objective Labs: Basic Metabolic Panel: Recent Labs  Lab 03/16/23 2259 03/17/23 1430 03/18/23 0745  NA 133* 133* 131*  K 5.3* 6.0* 4.8  CL 95* 96* 94*  CO2 20* 20* 21*  GLUCOSE 132* 119* 112*  BUN 63* 65* 40*  CREATININE 12.88* 14.09* 8.68*  CALCIUM 9.1 9.4 10.0  PHOS  --   --  7.3*   Liver Function Tests: Recent Labs  Lab 03/16/23 1140 03/16/23 2259 03/17/23 1430  AST 20 19 16   ALT 17 17 15   ALKPHOS 74 63 63  BILITOT 1.0 0.8 0.7  PROT 7.4 6.7 7.2  ALBUMIN 3.2* 2.9* 3.4*   No results for input(s): "LIPASE", "AMYLASE" in the last 168 hours. CBC: Recent Labs  Lab 03/16/23 1055 03/16/23 2208 03/16/23 2259 03/17/23 1430 03/18/23 0745  WBC 10.2  --  12.4* 15.3* 12.6*  NEUTROABS  --   --  9.6*  --   --   HGB 10.8*   < > 10.1* 10.1* 10.5*  HCT 35.7*   < > 33.9* 33.1* 33.6*  MCV 98.1  --  100.0 98.5 96.3  PLT 229  --  220 226 219   < > = values in this interval not displayed.   Blood Culture    Component Value Date/Time   SDES BLOOD LEFT HAND 03/17/2023 0153   SPECREQUEST  03/17/2023 0153    BOTTLES DRAWN AEROBIC AND ANAEROBIC Blood Culture results may not be optimal due to an inadequate volume of blood received in culture bottles   CULT  03/17/2023 0153     NO GROWTH 2 DAYS Performed at [name], [address], [place], [place] [address]    REPTSTATUS PENDING 03/17/2023 0153    Cardiac Enzymes: No results for input(s): "CKTOTAL", "CKMB", "CKMBINDEX", "TROPONINI" in the last 168 hours. CBG: Recent Labs  Lab 03/17/23 0737 03/17/23 0959 03/17/23 1209 03/17/23 1945 03/18/23 0943  GLUCAP 83 106* 81 91 135*   Iron Studies: No results for input(s): "IRON", "TIBC", "TRANSFERRIN", "FERRITIN" in the last 72 hours. @lablastinr3 @ Studies/Results: No results found. Medications:  cefTRIAXone (ROCEPHIN)  IV Stopped (03/18/23 1614)    aspirin EC  81 mg Oral Daily   atorvastatin  10 mg Oral Daily   azithromycin  500 mg Oral Daily   famotidine  20 mg Oral Daily   heparin  5,000 Units Subcutaneous Q8H   levothyroxine  100 mcg Oral Daily   midodrine  10 mg Oral Q M,W,F-HD   midodrine  5 mg Oral TID WC   pantoprazole  40 mg Oral QHS   saccharomyces boulardii  250 mg Oral BID   senna  2 tablet Oral Daily  sucroferric oxyhydroxide  500 mg Oral TID WC      Assessment/Plan: Somnolence - assuming not hypercarbic or ^^NH3, her symptoms (asterixis, somnolence) could be due to high dose gabapentin / lyrica she is on. Would hold these both for now. Improved post HD. Max gabapentin dose for ESRD patient is 300mg  daily, would completely avoid gabapentin and lyrica for now.  ESKD - on HD MWF. Currently off schedule, HD today, will reeval in AM for next HD.  BP - not on any BP lowering meds, midodrine pre HD  Volume - no vol excess on exam.  Anemia of eskd - Hb 10-12 here, follow.  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  The darkening of the 2 fingers on the R hand, the ulcer on R 2nd digit and the lesions on the finger on the L hand are not characteristic of calciphylaxis. For the R hand ischemia showed be considered.  Dopplers pending, will go ahead and consult VVS as well.     Rogers Blocker, PA-C 03/19/2023, 1:15 PM  [employer] Pager: [phone]

## 2023-03-20 ENCOUNTER — Other Ambulatory Visit (HOSPITAL_COMMUNITY): Payer: 59

## 2023-03-20 ENCOUNTER — Inpatient Hospital Stay (HOSPITAL_COMMUNITY): Payer: 59

## 2023-03-20 DIAGNOSIS — I950 Idiopathic hypotension: Secondary | ICD-10-CM | POA: Diagnosis not present

## 2023-03-20 DIAGNOSIS — I739 Peripheral vascular disease, unspecified: Secondary | ICD-10-CM

## 2023-03-20 LAB — CBC
HCT: 37.1 % (ref 36.0–46.0)
Hemoglobin: 11.4 g/dL — ABNORMAL LOW (ref 12.0–15.0)
MCH: 30.2 pg (ref 26.0–34.0)
MCHC: 30.7 g/dL (ref 30.0–36.0)
MCV: 98.1 fL (ref 80.0–100.0)
Platelets: 203 10*3/uL (ref 150–400)
RBC: 3.78 MIL/uL — ABNORMAL LOW (ref 3.87–5.11)
RDW: 18.9 % — ABNORMAL HIGH (ref 11.5–15.5)
WBC: 9.4 10*3/uL (ref 4.0–10.5)
nRBC: 0.3 % — ABNORMAL HIGH (ref 0.0–0.2)

## 2023-03-20 LAB — RENAL FUNCTION PANEL
Albumin: 3.3 g/dL — ABNORMAL LOW (ref 3.5–5.0)
Anion gap: 15 (ref 5–15)
BUN: 33 mg/dL — ABNORMAL HIGH (ref 6–20)
CO2: 21 mmol/L — ABNORMAL LOW (ref 22–32)
Calcium: 9.1 mg/dL (ref 8.9–10.3)
Chloride: 97 mmol/L — ABNORMAL LOW (ref 98–111)
Creatinine, Ser: 7.06 mg/dL — ABNORMAL HIGH (ref 0.44–1.00)
GFR, Estimated: 6 mL/min — ABNORMAL LOW (ref >60)
Glucose, Bld: 62 mg/dL — ABNORMAL LOW (ref 70–99)
Phosphorus: 6 mg/dL — ABNORMAL HIGH (ref 2.5–4.6)
Potassium: 4.4 mmol/L (ref 3.5–5.1)
Sodium: 133 mmol/L — ABNORMAL LOW (ref 135–145)

## 2023-03-20 LAB — GLUCOSE, CAPILLARY
Glucose-Capillary: 74 mg/dL (ref 70–99)
Glucose-Capillary: 61 mg/dL — ABNORMAL LOW (ref 70–99)
Glucose-Capillary: 105 mg/dL — ABNORMAL HIGH (ref 70–99)

## 2023-03-20 LAB — MAGNESIUM: Magnesium: 2.2 mg/dL (ref 1.7–2.4)

## 2023-03-20 MED ORDER — SODIUM THIOSULFATE 250 MG/ML IV SOLN
25.0000 g | INTRAVENOUS | Status: DC
  Filled 2023-03-20 (×2): qty 100

## 2023-03-20 MED ORDER — OXYCODONE-ACETAMINOPHEN 5-325 MG PO TABS
1.0000 | ORAL_TABLET | Freq: Four times a day (QID) | ORAL | Status: DC | PRN
  Administered 2023-03-20 – 2023-03-22 (×5): 1 via ORAL
  Filled 2023-03-20 (×5): qty 1

## 2023-03-20 NOTE — Plan of Care (Signed)

## 2023-03-20 NOTE — Progress Notes (Signed)
[facility] Progress Note   Subjective: Back to baseline neuro wise. Upset about medicine she was receiving at SNF. Emotional support to pt.    Objective Vitals:   03/20/23 0820 03/20/23 1045 03/20/23 1213 03/20/23 1540  BP: (!) 132/59 127/76    Pulse:      Resp: (!) 9 (!) 0    Temp:   97.6 F (36.4 C) (!) 97.4 F (36.3 C)  TempSrc:   Oral Oral  SpO2:      Weight:      Height:       Physical Exam General: Chronically ill appearing female Neuro: A & O X 3. Appears at baseline Heart: HS distant. S1.S2 + Systolic M. SR Lungs: CTAB A/P Abdomen: Obese, NABS, Dressing on abdomin Extremities: Bilateral AKA Dialysis Access: [facility] Drs[facility] intact   Additional Objective Labs: Basic Metabolic Panel: Recent Labs  Lab 03/18/23 0745 03/19/23 1458 03/20/23 1047  NA 131* 132* 133*  K 4.8 4.8 4.4  CL 94* 94* 97*  CO2 21* 24 21*  GLUCOSE 112* 82 62*  BUN 40* 56* 33*  CREATININE 8.68* 10.82* 7.06*  CALCIUM 10.0 8.9 9.1  PHOS 7.3*  --  6.0*   Liver Function Tests: Recent Labs  Lab 03/16/23 1140 03/16/23 2259 03/17/23 1430 03/20/23 1047  AST 20 19 16   --   ALT 17 17 15   --   ALKPHOS 74 63 63  --   BILITOT 1.0 0.8 0.7  --   PROT 7.4 6.7 7.2  --   ALBUMIN 3.2* 2.9* 3.4* 3.3*   No results for input(s): "LIPASE", "AMYLASE" in the last 168 hours. CBC: Recent Labs  Lab 03/16/23 2259 03/17/23 1430 03/18/23 0745 03/19/23 1458 03/20/23 1047  WBC 12.4* 15.3* 12.6* 10.3 9.4  NEUTROABS 9.6*  --   --  6.9  --   HGB 10.1* 10.1* 10.5* 10.0* 11.4*  HCT 33.9* 33.1* 33.6* 33.0* 37.1  MCV 100.0 98.5 96.3 97.9 98.1  PLT 220 226 219 208 203   Blood Culture    Component Value Date/Time   SDES BLOOD LEFT HAND 03/17/2023 0153   SPECREQUEST  03/17/2023 0153    BOTTLES DRAWN AEROBIC AND ANAEROBIC Blood Culture results may not be optimal due to an inadequate volume of blood received in culture bottles   CULT  03/17/2023 0153    NO GROWTH 3 DAYS Performed at [facility], [address], [place], [place] [address]    REPTSTATUS PENDING 03/17/2023 0153    Cardiac Enzymes: No results for input(s): "CKTOTAL", "CKMB", "CKMBINDEX", "TROPONINI" in the last 168 hours. CBG: Recent Labs  Lab 03/17/23 1945 03/18/23 0943 03/20/23 0736 03/20/23 1349 03/20/23 1436  GLUCAP 91 135* 74 61* 105*   Iron Studies: No results for input(s): "IRON", "TIBC", "TRANSFERRIN", "FERRITIN" in the last 72 hours. @lablastinr3 @ Studies/Results: VAS [place] UPPER EXTREMITY ARTERIAL DUPLEX Result Date: 03/20/2023  UPPER EXTREMITY DUPLEX STUDY Patient Name:  Courtney Gray  Date of Exam:   03/20/2023 Medical Rec #: [id number]        Accession #:    [id number] Date of Birth: 11/14/1970         Patient Gender: F Patient Age:   52 years Exam Location:  [facility] Procedure:      VAS [place] UPPER EXTREMITY ARTERIAL DUPLEX Referring Phys: Stephania Fragmin --------------------------------------------------------------------------------  Indications: Concern for vascular compromise. History:     Patient has a history of ischemic fingertips of RUE.  Risk Factors:  Hypertension, hyperlipidemia, Diabetes, no history of smoking,                prior MI, coronary artery disease. Other Factors: ESRD (HD), PAD (BLE AKA), CHF. LUE middle finger amputation Limitations: Patient very uncooperative. Patient sitting up (poor positioning),              falling asleep and continuously moving arms. Heavy arterial              calcification limiting visualization of vessels. Comparison Study: Previous RUE arterial duplex on 12/03/21 & LUE arterial duplex                   on 08/11/21. Performing Technologist: Ernestene Mention RVT, RDMS  Examination Guidelines: A complete evaluation includes B-mode imaging, spectral Doppler, color Doppler, and power Doppler as needed of all accessible portions of each vessel. Bilateral testing is considered an integral part of a complete examination. Limited examinations for reoccurring  indications may be performed as noted.  Right Doppler Findings: +---------------+----------+----------+--------+--------+ Site           PSV (cm/s)Waveform  StenosisComments +---------------+----------+----------+--------+--------+ Subclavian Prox39        biphasic                   +---------------+----------+----------+--------+--------+ Subclavian Mid 32        monophasic                 +---------------+----------+----------+--------+--------+ Subclavian Dist          monophasic                 +---------------+----------+----------+--------+--------+ Axillary       31        biphasic                   +---------------+----------+----------+--------+--------+ Brachial Prox  34        biphasic                   +---------------+----------+----------+--------+--------+ Brachial Dist  34        biphasic                   +---------------+----------+----------+--------+--------+ Radial Prox    18        monophasic                 +---------------+----------+----------+--------+--------+ Radial Mid     9         monophasic                 +---------------+----------+----------+--------+--------+ Radial Dist    11        monophasic                 +---------------+----------+----------+--------+--------+ Ulnar Prox     45        monophasic                 +---------------+----------+----------+--------+--------+ Ulnar Mid      42        monophasic                 +---------------+----------+----------+--------+--------+ Ulnar Dist     42        monophasic                 +---------------+----------+----------+--------+--------+ Palmar Arch    13        monophasic                 +---------------+----------+----------+--------+--------+  Left Doppler Findings: +---------------+----------+----------+--------+-------------------------------+ Site           PSV (cm/s)Waveform  StenosisComments                         +---------------+----------+----------+--------+-------------------------------+ Subclavian Prox                            not visualized - HD             +---------------+----------+----------+--------+-------------------------------+ Subclavian Mid 23        monophasic                                        +---------------+----------+----------+--------+-------------------------------+ Axillary       27        monophasic                                        +---------------+----------+----------+--------+-------------------------------+ Brachial Prox  41        monophasic                                        +---------------+----------+----------+--------+-------------------------------+ Brachial Dist  31        monophasic                                        +---------------+----------+----------+--------+-------------------------------+ Radial Prox    40        monophasic                                        +---------------+----------+----------+--------+-------------------------------+ Radial Mid                                 IV palcement - not visualized   +---------------+----------+----------+--------+-------------------------------+ Radial Dist    45        monophasic                                        +---------------+----------+----------+--------+-------------------------------+ Ulnar Prox                                 unable to insonate - poor                                                  positioning                     +---------------+----------+----------+--------+-------------------------------+ Ulnar Mid      9         monophasic                                        +---------------+----------+----------+--------+-------------------------------+  Ulnar Dist     21        monophasic                                        +---------------+----------+----------+--------+-------------------------------+  Palmar Arch                                unable to insonate              +---------------+----------+----------+--------+-------------------------------+ Very limited due to patient turned away / leaning forward. Continuous movement of arm due to patient falling in and out of sleep.  Summary:  Bilateral: No obvious evidence of significant stenosis or occlusion,            however very difficult and limited study due to heavy            arterial calcification and patient condition. *See table(s) above for measurements and observations.    Preliminary    Medications:  cefTRIAXone (ROCEPHIN)  IV Stopped (03/20/23 1122)    aspirin EC  81 mg Oral Daily   atorvastatin  10 mg Oral Daily   azithromycin  500 mg Oral Daily   famotidine  20 mg Oral Daily   heparin  5,000 Units Subcutaneous Q8H   levothyroxine  100 mcg Oral Daily   midodrine  10 mg Oral Q M,W,F-HD   pantoprazole  40 mg Oral QHS   saccharomyces boulardii  250 mg Oral BID   senna  2 tablet Oral Daily   sucroferric oxyhydroxide  500 mg Oral TID WC   HD orders: AF MWF 4 hrs 180NRe 400/Autoflow 1.5 119.8 kg 2.0 K/2.0 Ca UFP 2 TDC - Heparin 9000 units IV three times per week - Hectorol 3 mcg IV three times per week Order needs to be discontinued H/O Calciphylaxis - Mircera 50 mcg IV q 2 weeks (last dose 100 mcg IV 03/01/2023 next dose should be have given 03/16/2023)  - Venofer 50 mg IV weekly - Sodium theosulfate 25 grams IV TIW  Assessment/Plan: Somnolence - assuming not hypercarbic or ^^NH3, her symptoms (asterixis, somnolence) could be due to high dose gabapentin / lyrica she is on. Would hold these both for now. Improved post HD. Max gabapentin dose for ESRD patient is 300mg  daily, would completely avoid gabapentin and lyrica for now.  ESKD - on HD MWF. Currently off schedule. HD 03/21/2023 then will resume MWF 03/23/2023 BP - not on any BP lowering meds, midodrine pre HD  Volume - no vol excess on exam.  Anemia of eskd - Hb  10-12 here, follow.  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 Continue wound care Ischemic fingers R hand: Work up per VVS. High risk for amputation of digits per VVS note  Dorsey Authement H. Kambrey Hagger NP-C 03/20/2023, 4:37 PM  [employer] [phone]

## 2023-03-20 NOTE — Progress Notes (Signed)
PROGRESS NOTE Courtney Gray  ZOX:[id number] DOB: 1970-12-13 DOA: 03/16/2023 PCP: Default, Provider, MD  Brief Narrative/Hospital Course: 52 year old with history of HTN, ESRD on HD MWF, NSTEMI, obesity, DM2 status post bilateral AKA, PAD, chronic hypoxia, hypothyroidism comes to the ED for fatigue and change in mental status started prior to the day of admission.  Chest x-ray showed possible pneumonia versus atelectasis.  CT of the head was negative.  Started on Rocephin and azithromycin.  Patient is also had frequent hypoglycemic episodes-symptomatic with encephalopathy. Patient is from the facility as LTC and can return once medically stable-TOC following.  Patient was seen by vascular surgery for discolored fingers and pain likely chronic ischemia, underwent arterial duplex of upper extremities   Subjective: Patient seen and examined this morning Complains of ongoing pain over the fingers Daughter at the bedside No new complaints Mentation improved  Assessment and Plan: Active Problems:   Hypoglycemia   Hyperkalemia   Idiopathic hypotension   Symptomatic hypoglycemia with encephalopathy Symptomatic recurrent hypoglycemic episodes.  Apparently raising concern for adrenal insufficiency.  Patient also has underlying infection.  Blood sugar has stabilized.  Off steroid,cosyntropin test 12/10 was negative.If has hypoglycemic episode again, should correlate with BMP. Her Ammonia, TSH normal.  Her A1c is 5.1 no diabetes Recent Labs  Lab 03/16/23 2259 03/17/23 0039 03/17/23 0959 03/17/23 1209 03/17/23 1945 03/18/23 0943 03/20/23 0736  GLUCAP  --    < > 106* 81 91 135* 74  HGBA1C 5.1  --   --   --   --   --   --    < > = values in this interval not displayed.    Acute encephalopathy, metabolic toxic Multifactorial with infection, hypoglycemia, multiple sedative medications/pain medicine. Mental status has improved, resume hydrocodone for pain control and tolerating Off fentanyl patch,  have extreme caution with pain medication. Discontinued Flexeril, gabapentin, oxycodone, Lyrica.  Advised her that anytime there is a change in medication needs to be reviewed by her nephrology. Daughter at bedside aware   Community-acquired pneumonia CT scan shows upper lobe pneumonitis raising concern for possible pneumonia.  Continue on empiric antibiotics ceftriaxone azithromycin, cont bronchodilators/I-S/flutter valve.  Procalcitonin 0.39.SLP evla  Bilateral upper extremity darkening of fingertips Right index fingertip dark with some ulceration.  Does not appear to be calcification.  This is likely from blood flow compromise but appears to be chronic, vascular consult appreciated, arterial duplex pending, noted plan for angiography Monday.Per nephrology not typical for calciphylaxis continue wound care.      ESRD MWF Hyperkalemia Continue HD per nephrology , k better.  Refusing midodrine, changed to predialysis   Congestive heart failure with reduced EF, 35% Echocardiogram done in August 2024 shows EF 35% with global hypokinesia.  Volume status will be managed with dialysis   Anemia chronic disease Hemoglobin currently stable around 10.0. trend labs Recent Labs  Lab 03/16/23 2208 03/16/23 2259 03/17/23 1430 03/18/23 0745 03/19/23 1458  HGB 10.9* 10.1* 10.1* 10.5* 10.0*  HCT 32.0* 33.9* 33.1* 33.6* 33.0*      Calciphylaxis:  continue wound care   Hypothyroidism:  TSH is stable continue home Synthroid.    Essential hypertension: BP soft hypotensive but measuring on upper arm in wristcontinue her home midodrine.  Continue home midodrine, adrenal insufficiency ruled out.  Antihypertensives on hold.   PAD s/p B/l BKA cont spirin and statin   GERD Protonix daily.  Pressure injury thigh posteriorly Right  stage IV POA Pressure Injury 03/17/23 Thigh Posterior;Proximal;Right Stage 4 - Full thickness  tissue loss with exposed bone, tendon or muscle. (Active)  03/17/23 2000   Location: Thigh  Location Orientation: Posterior;Proximal;Right  Staging: Stage 4 - Full thickness tissue loss with exposed bone, tendon or muscle.  Wound Description (Comments):   Present on Admission: Yes  Dressing Type Foam - Lift dressing to assess site every shift 03/17/23 2000   DVT prophylaxis: heparin injection 5,000 Units Start: 03/16/23 2200 Code Status:   Code Status: Full Code Family Communication: plan of care discussed with patient/daughter at bedside. Patient status is: Remains hospitalized because of severity of illness Level of care: Progressive   Dispo:The patient is from:[facility]            Anticipated disposition: LTACH. Once ok /w vascular  Objective: Vitals last 24 hrs: Vitals:   03/20/23 0312 03/20/23 0652 03/20/23 0732 03/20/23 0820  BP:    (!) 132/59  Pulse:      Resp: 13   (!) 9  Temp: 97.8 F (36.6 C)  97.7 F (36.5 C)   TempSrc: Oral  Oral   SpO2: 98%     Weight:  121.5 kg    Height:       Weight change:   Physical Examination: General exam: alert awake, oriented  HEENT:Oral mucosa moist, Ear/Nose WNL grossly Respiratory system: Bilaterally clear BS,no use of accessory muscle Cardiovascular system: S1 & S2 +, No JVD. Gastrointestinal system: Abdomen soft,NT,ND, BS+ Nervous System: Alert, awake, moving all extremities,and following commands. Extremities: LE edema neg,distal peripheral pulses palpable and warm.  Skin: No rashes,no icterus. MSK: Normal muscle bulk,tone, power, discolored fingers  Medications reviewed:  Scheduled Meds:  aspirin EC  81 mg Oral Daily   atorvastatin  10 mg Oral Daily   azithromycin  500 mg Oral Daily   famotidine  20 mg Oral Daily   heparin  5,000 Units Subcutaneous Q8H   levothyroxine  100 mcg Oral Daily   midodrine  10 mg Oral Q M,W,F-HD   pantoprazole  40 mg Oral QHS   saccharomyces boulardii  250 mg Oral BID   senna  2 tablet Oral Daily   sucroferric oxyhydroxide  500 mg Oral TID WC   Continuous  Infusions:  cefTRIAXone (ROCEPHIN)  IV 1 g (03/20/23 1052)    Diet Order             Diet renal/carb modified with fluid restriction Diet-HS Snack? Nothing; Fluid restriction: 1200 mL Fluid; Room service appropriate? Yes; Fluid consistency: Thin  Diet effective now                   Intake/Output Summary (Last 24 hours) at 03/20/2023 1107 Last data filed at 03/20/2023 0600 Gross per 24 hour  Intake 460 ml  Output 2000 ml  Net -1540 ml   Net IO Since Admission: -1,036 mL [03/20/23 1107]  Wt Readings from Last 3 Encounters:  03/20/23 121.5 kg  12/02/22 117.9 kg  11/13/22 120.9 kg     Unresulted Labs (From admission, onward)     Start     Ordered   03/19/23 1914  Renal function panel  Once,   R       Question:  Specimen collection method  Answer:  IV Team=IV Team collect   03/19/23 1913   03/19/23 1914  CBC  Once,   R       Question:  Specimen collection method  Answer:  IV Team=IV Team collect   03/19/23 1913   03/18/23 0500  Basic metabolic panel  Daily,  R     Question:  Specimen collection method  Answer:  IV Team=IV Team collect   03/17/23 0929   03/18/23 0500  CBC  Daily,   R     Question:  Specimen collection method  Answer:  IV Team=IV Team collect   03/17/23 0929   03/18/23 0500  Magnesium  Daily,   R     Question:  Specimen collection method  Answer:  IV Team=IV Team collect   03/17/23 0929   03/16/23 2138  Expectorated Sputum Assessment w Gram Stain, Rflx to Resp Cult  Once,   R        03/16/23 2138   03/16/23 2138  Legionella Pneumophila Serogp 1 Ur Ag  Once,   R        03/16/23 2138   03/16/23 2138  Strep pneumoniae urinary antigen  Once,   R        03/16/23 2138   03/16/23 2136  CBC  (heparin)  Once,   R       Comments: Baseline for heparin therapy IF NOT ALREADY DRAWN.  Notify MD if PLT < 100 K.    03/16/23 2138          Data Reviewed: I have personally reviewed following labs and imaging studies CBC: Recent Labs  Lab 03/16/23 1055  03/16/23 2208 03/16/23 2259 03/17/23 1430 03/18/23 0745 03/19/23 1458  WBC 10.2  --  12.4* 15.3* 12.6* 10.3  NEUTROABS  --   --  9.6*  --   --  6.9  HGB 10.8* 10.9* 10.1* 10.1* 10.5* 10.0*  HCT 35.7* 32.0* 33.9* 33.1* 33.6* 33.0*  MCV 98.1  --  100.0 98.5 96.3 97.9  PLT 229  --  220 226 219 208   Basic Metabolic Panel:  Recent Labs  Lab 03/16/23 2103 03/16/23 2208 03/16/23 2259 03/17/23 1430 03/18/23 0745 03/19/23 1458  NA 134* 131* 133* 133* 131* 132*  K 5.4* 5.6* 5.3* 6.0* 4.8 4.8  CL 95*  --  95* 96* 94* 94*  CO2 21*  --  20* 20* 21* 24  GLUCOSE 87  --  132* 119* 112* 82  BUN 63*  --  63* 65* 40* 56*  CREATININE 12.88*  --  12.88* 14.09* 8.68* 10.82*  CALCIUM 9.2  --  9.1 9.4 10.0 8.9  MG  --   --   --   --  2.2  --   PHOS  --   --   --   --  7.3*  --    GFR: Estimated Creatinine Clearance: 5.3 mL/min (A) (by C-G formula based on SCr of 10.82 mg/dL (H)). Liver Function Tests:  Recent Labs  Lab 03/16/23 1140 03/16/23 2259 03/17/23 1430  AST 20 19 16   ALT 17 17 15   ALKPHOS 74 63 63  BILITOT 1.0 0.8 0.7  PROT 7.4 6.7 7.2  ALBUMIN 3.2* 2.9* 3.4*   No results for input(s): "LIPASE", "AMYLASE" in the last 168 hours.  Recent Labs  Lab 03/16/23 2259  AMMONIA 32   Coagulation Profile: No results for input(s): "INR", "PROTIME" in the last 168 hours. No results for input(s): "PROBNP" in the last 168 hours.  No results for input(s): "HGBA1C" in the last 72 hours.  Recent Labs  Lab 03/17/23 0959 03/17/23 1209 03/17/23 1945 03/18/23 0943 03/20/23 0736  GLUCAP 106* 81 91 135* 74   No results for input(s): "CHOL", "HDL", "LDLCALC", "TRIG", "CHOLHDL", "LDLDIRECT" in the last 72 hours. No results for input(s): "  TSH", "T4TOTAL", "FREET4", "T3FREE", "THYROIDAB" in the last 72 hours.  Sepsis Labs: Recent Labs  Lab 03/16/23 1038 03/16/23 2259 03/17/23 0040 03/17/23 0153  PROCALCITON  --  0.39  --   --   LATICACIDVEN 1.3  --  1.2 1.2   Recent Results (from  the past 240 hours)  Resp panel by RT-PCR (RSV, Flu A&B, Covid) Anterior Nasal Swab     Status: None   Collection Time: 03/16/23 10:23 AM   Specimen: Anterior Nasal Swab  Result Value Ref Range Status   SARS Coronavirus 2 by RT PCR NEGATIVE NEGATIVE Final   Influenza A by PCR NEGATIVE NEGATIVE Final   Influenza B by PCR NEGATIVE NEGATIVE Final    Comment: (NOTE) The Xpert Xpress SARS-CoV-2/FLU/RSV plus assay is intended as an aid in the diagnosis of influenza from Nasopharyngeal swab specimens and should not be used as a sole basis for treatment. Nasal washings and aspirates are unacceptable for Xpert Xpress SARS-CoV-2/FLU/RSV testing.  Fact Sheet for Patients: BloggerCourse.com  Fact Sheet for Healthcare Providers: SeriousBroker.it  This test is not yet approved or cleared by the [place] FDA and has been authorized for detection and/or diagnosis of SARS-CoV-2 by FDA under an Emergency Use Authorization (EUA). This EUA will remain in effect (meaning this test can be used) for the duration of the COVID-19 declaration under Section 564(b)(1) of the Act, 21 U.S.C. section 360bbb-3(b)(1), unless the authorization is terminated or revoked.     Resp Syncytial Virus by PCR NEGATIVE NEGATIVE Final    Comment: (NOTE) Fact Sheet for Patients: BloggerCourse.com  Fact Sheet for Healthcare Providers: SeriousBroker.it  This test is not yet approved or cleared by the [place] FDA and has been authorized for detection and/or diagnosis of SARS-CoV-2 by FDA under an Emergency Use Authorization (EUA). This EUA will remain in effect (meaning this test can be used) for the duration of the COVID-19 declaration under Section 564(b)(1) of the Act, 21 U.S.C. section 360bbb-3(b)(1), unless the authorization is terminated or revoked.  Performed at [facility], [address]  [place], [place] [address]   Culture, blood (Routine X 2) w Reflex to ID Panel     Status: None (Preliminary result)   Collection Time: 03/17/23  1:53 AM   Specimen: BLOOD LEFT HAND  Result Value Ref Range Status   Specimen Description BLOOD LEFT HAND  Final   Special Requests   Final    BOTTLES DRAWN AEROBIC AND ANAEROBIC Blood Culture results may not be optimal due to an inadequate volume of blood received in culture bottles   Culture   Final    NO GROWTH 3 DAYS Performed at [facility], [address] [place], [place] [address]    Report Status PENDING  Incomplete  MRSA Next Gen by PCR, Nasal     Status: None   Collection Time: 03/17/23  9:25 PM   Specimen: Nasal Mucosa; Nasal Swab  Result Value Ref Range Status   MRSA by PCR Next Gen NOT DETECTED NOT DETECTED Final    Comment: (NOTE) The GeneXpert MRSA Assay (FDA approved for NASAL specimens only), is one component of a comprehensive MRSA colonization surveillance program. It is not intended to diagnose MRSA infection nor to guide or monitor treatment for MRSA infections. Test performance is not FDA approved in patients less than 30 years old. Performed at [facility], [address] [place], [place] [address]     Antimicrobials/Microbiology: Anti-infectives (From admission, onward)  Start     Dose/Rate Route Frequency Ordered Stop   03/17/23 1000  azithromycin (ZITHROMAX) 500 mg in sodium chloride 0.9 % 250 mL IVPB  Status:  Discontinued        500 mg 250 mL/hr over 60 Minutes Intravenous Every 24 hours 03/17/23 0201 03/17/23 0801   03/17/23 1000  cefTRIAXone (ROCEPHIN) 1 g in sodium chloride 0.9 % 100 mL IVPB        1 g 200 mL/hr over 30 Minutes Intravenous Every 24 hours 03/17/23 0201     03/17/23 1000  azithromycin (ZITHROMAX) tablet 500 mg        500 mg Oral Daily 03/17/23 0801     03/16/23 1115  cefTRIAXone (ROCEPHIN) 1 g in sodium chloride 0.9 % 100 mL IVPB        1 g 200 mL/hr over 30 Minutes  Intravenous  Once 03/16/23 1102 03/16/23 1314   03/16/23 1115  azithromycin (ZITHROMAX) 500 mg in sodium chloride 0.9 % 250 mL IVPB        500 mg 250 mL/hr over 60 Minutes Intravenous  Once 03/16/23 1102 03/16/23 1433         Component Value Date/Time   SDES BLOOD LEFT HAND 03/17/2023 0153   SPECREQUEST  03/17/2023 0153    BOTTLES DRAWN AEROBIC AND ANAEROBIC Blood Culture results may not be optimal due to an inadequate volume of blood received in culture bottles   CULT  03/17/2023 0153    NO GROWTH 3 DAYS Performed at [facility], [address], [place], [place] [address]    REPTSTATUS PENDING 03/17/2023 0153     Radiology Studies: No results found.   LOS: 4 days   Total time spent in review of labs and imaging, patient evaluation, formulation of plan, documentation and communication with family: 25 minutes  Lanae Boast, MD [facility]  03/20/2023, 11:07 AM

## 2023-03-20 NOTE — Progress Notes (Signed)
BUE arterial duplex has been completed.   Results can be found under chart review under CV PROC. 03/20/2023 12:36 PM Kendel Pesnell RVT, RDMS

## 2023-03-20 NOTE — Plan of Care (Signed)

## 2023-03-21 DIAGNOSIS — E162 Hypoglycemia, unspecified: Secondary | ICD-10-CM | POA: Diagnosis not present

## 2023-03-21 LAB — GLUCOSE, CAPILLARY
Glucose-Capillary: 69 mg/dL — ABNORMAL LOW (ref 70–99)
Glucose-Capillary: 79 mg/dL (ref 70–99)

## 2023-03-21 MED ORDER — PENTAFLUOROPROP-TETRAFLUOROETH EX AERO
1.0000 | INHALATION_SPRAY | CUTANEOUS | Status: DC | PRN
Start: 2023-03-21 — End: 2023-03-22

## 2023-03-21 MED ORDER — RENA-VITE PO TABS
1.0000 | ORAL_TABLET | Freq: Every day | ORAL | Status: DC
  Administered 2023-03-22 – 2023-03-23 (×3): 1 via ORAL
  Filled 2023-03-21 (×4): qty 1

## 2023-03-21 MED ORDER — SODIUM THIOSULFATE 250 MG/ML IV SOLN
25.0000 g | Freq: Once | INTRAVENOUS | Status: DC
  Filled 2023-03-21: qty 100

## 2023-03-21 MED ORDER — HEPARIN SODIUM (PORCINE) 1000 UNIT/ML DIALYSIS
1000.0000 [IU] | INTRAMUSCULAR | Status: DC | PRN
  Filled 2023-03-21: qty 1

## 2023-03-21 MED ORDER — LIDOCAINE-PRILOCAINE 2.5-2.5 % EX CREA: 1.0000 | TOPICAL_CREAM | CUTANEOUS | Status: DC | PRN

## 2023-03-21 MED ORDER — ANTICOAGULANT SODIUM CITRATE 4% (200MG/5ML) IV SOLN: 5.0000 mL | Status: DC | PRN

## 2023-03-21 MED ORDER — NEPRO/CARBSTEADY PO LIQD
237.0000 mL | Freq: Two times a day (BID) | ORAL | Status: DC
  Administered 2023-03-23: 237 mL via ORAL

## 2023-03-21 MED ORDER — ALTEPLASE 2 MG IJ SOLR: 2.0000 mg | Freq: Once | INTRAMUSCULAR | Status: DC | PRN

## 2023-03-21 MED ORDER — LIDOCAINE HCL (PF) 1 % IJ SOLN: 5.0000 mL | INTRAMUSCULAR | Status: DC | PRN

## 2023-03-21 MED ORDER — HEPARIN SODIUM (PORCINE) 1000 UNIT/ML DIALYSIS
9000.0000 [IU] | Freq: Once | INTRAMUSCULAR | Status: DC
  Filled 2023-03-21: qty 9

## 2023-03-21 NOTE — Progress Notes (Signed)
PROGRESS NOTE Courtney Gray  EXB:[id number] DOB: July 16, 1970 DOA: 03/16/2023 PCP: Default, Provider, MD  Brief Narrative/Hospital Course: 52 year old with history of HTN, ESRD on HD MWF, NSTEMI, obesity, DM2 status post bilateral AKA, PAD, chronic hypoxia, hypothyroidism comes to the ED for fatigue and change in mental status started prior to the day of admission.  Chest x-ray showed possible pneumonia versus atelectasis.  CT of the head was negative.  Started on Rocephin and azithromycin.  Patient is also had frequent hypoglycemic episodes-symptomatic with encephalopathy. Patient is from the facility as LTC and can return once medically stable-TOC following.  Patient was seen by vascular surgery for discolored fingers and pain likely chronic ischemia, underwent arterial duplex of upper extremities Patient is having discoloration and pain of her fingers underwent upper extremity arterial duplex bilateral no obvious evidence of significant stenosis or occlusion.  Seen by vascular surgery, patient aware high risk for finger amputation.  Subjective: Patient seen and examined this morning CBG in 60s-but asymptomatic, has ordered for diet, not eating much/ picky about eating Daughter at the bedside  No new complaints, refusing tele> tele discontinued   Assessment and Plan: Active Problems:   Hypoglycemia   Hyperkalemia   Idiopathic hypotension   Symptomatic hypoglycemia with encephalopathy Symptomatic recurrent hypoglycemic episodes.  Apparently raising concern for adrenal insufficiency.  Patient also has underlying infection.  Blood sugar on low side but asymptomatic -suspect from poor oral intake. Off steroid,cosyntropin test 12/10 was negative.refused labs this morning to confirm hypoglycemia. Change diet fro mcarbdiet/renal to renal only. Allow mo ecarbAgain encourage oral intake.Her Ammonia, TSH normal.  Her A1c is 5.1 no diabetes Recent Labs  Lab 03/16/23 2259 03/17/23 0039 03/18/23 0943  03/20/23 0736 03/20/23 1349 03/20/23 1436 03/21/23 0710  GLUCAP  --    < > 135* 74 61* 105* 69*  HGBA1C 5.1  --   --   --   --   --   --    < > = values in this interval not displayed.   Acute encephalopathy,metabolic toxic: Multifactorial with infection,hypoglycemia, multiple sedative medications/pain medicine. Mental status has improved, cont hydrocodone for pain control and tolerating Off fentanyl patch, have extreme caution with pain medication. Discontinued Flexeril, gabapentin, oxycodone, Lyrica.  Advised her that anytime there is a change in medication needs to be reviewed by her nephrology. Daughter at bedside aware   Community-acquired pneumonia: CT scan shows upper lobe pneumonitis raising concern for possible pneumonia.  Complete ceftriaxone azithromycin Cont bronchodilators/I-S/flutter valve.  Procalcitonin 0.39.SLP evla  Bilateral upper extremity darkening of fingertips Right index fingertip dark with some ulceration.  Does not appear to be calcification.  This is likely from blood flow compromise but appears to be chronic, vascular consult appreciated, arterial duplex- no acute findings. Await  pending, noted plan for angiography Monday.Per nephrology not typical for calciphylaxis continue wound care.      ESRD MWF Hyperkalemia: Continue HD per nephrology.Refusing midodrine, changed to predialysis   Congestive heart failure with reduced EF, 35% TTE 8/24 shows EF 35% with global hypokinesia.  Volume status managed W/ hd   Anemia chronic disease Hemoglobin currently stable around 10.0. trend labs Recent Labs  Lab 03/16/23 2259 03/17/23 1430 03/18/23 0745 03/19/23 1458 03/20/23 1047  HGB 10.1* 10.1* 10.5* 10.0* 11.4*  HCT 33.9* 33.1* 33.6* 33.0* 37.1      Calciphylaxis:  continue wound care   Hypothyroidism:  TSH is stable continue home Synthroid.    Essential hypertension: BP soft.Marland Kitchen REFUSED Midodrine. Adrenal insufficiency ruled out.  Antihypertensives on  hold.   PAD s/p B/l BKA cont spirin and statin   GERD Protonix daily.  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 (Active)  03/17/23 2000  Location: Thigh  Location Orientation: Posterior;Proximal;Right  Staging: Stage 4 - Full thickness tissue loss with exposed bone, tendon or muscle.  Wound Description (Comments):   Present on Admission: Yes  Dressing Type Foam - Lift dressing to assess site every shift 03/17/23 2000   DVT prophylaxis: heparin injection 5,000 Units Start: 03/16/23 2200 Code Status:   Code Status: Full Code Family Communication: plan of care discussed with patient/daughter at bedside. Patient status is: Remains hospitalized because of severity of illness Level of care: Med-Surg   Dispo:The patient is from:[facility]            Anticipated disposition: LTACH vs snf Once ok /w vascular  Objective: Vitals last 24 hrs: Vitals:   03/20/23 2346 03/21/23 0328 03/21/23 0429 03/21/23 0748  BP:   (!) 135/98   Pulse:   72   Resp:   17   Temp: (!) 97.4 F (36.3 C) (!) 97.4 F (36.3 C)  98.7 F (37.1 C)  TempSrc: Oral Oral  Oral  SpO2:      Weight:      Height:       Weight change:   Physical Examination:  General exam: alert awake, Obese HEENT:Oral mucosa moist, Ear/Nose WNL grossly Respiratory system: Bilaterally clear BS,no use of accessory muscle Cardiovascular system: S1 & S2 +, No JVD. Gastrointestinal system: Abdomen soft,NT,ND, BS+ Nervous System: Alert, awake, moving all extremities,and following commands. Extremities: amputaion status Skin: No rashes,no icterus.  Medications reviewed:  Scheduled Meds:  aspirin EC  81 mg Oral Daily   atorvastatin  10 mg Oral Daily   azithromycin  500 mg Oral Daily   famotidine  20 mg Oral Daily   heparin  5,000 Units Subcutaneous Q8H   levothyroxine  100 mcg Oral Daily    midodrine  10 mg Oral Q M,W,F-HD   pantoprazole  40 mg Oral QHS   saccharomyces boulardii  250 mg Oral BID   senna  2 tablet Oral Daily   sucroferric oxyhydroxide  500 mg Oral TID WC   Continuous Infusions:  cefTRIAXone (ROCEPHIN)  IV Stopped (03/20/23 1122)   sodium thiosulfate 25 g in sodium chloride 0.9 % 200 mL Infusion for Calciphylaxis      Diet Order             Diet renal/carb modified with fluid restriction Diet-HS Snack? Nothing; Fluid restriction: 1200 mL Fluid; Room service appropriate? Yes; Fluid consistency: Thin  Diet effective now                   Intake/Output Summary (Last 24 hours) at 03/21/2023 1013 Last data filed at 03/20/2023 1800 Gross per 24 hour  Intake 700 ml  Output --  Net 700 ml   Net IO Since Admission: -236 mL [03/21/23 1013]  Wt Readings from Last 3 Encounters:  03/20/23 121.5 kg  12/02/22 117.9 kg  11/13/22 120.9 kg     Unresulted Labs (From admission, onward)     Start     Ordered   03/18/23 0500  Basic metabolic panel  Daily,   R     Question:  Specimen collection method  Answer:  IV Team=IV Team collect   03/17/23 [phone]  03/18/23 0500  CBC  Daily,   R     Question:  Specimen collection method  Answer:  IV Team=IV Team collect   03/17/23 0929   03/18/23 0500  Magnesium  Daily,   R     Question:  Specimen collection method  Answer:  IV Team=IV Team collect   03/17/23 0929   03/16/23 2138  Expectorated Sputum Assessment w Gram Stain, Rflx to Resp Cult  Once,   R        03/16/23 2138   03/16/23 2138  Legionella Pneumophila Serogp 1 Ur Ag  Once,   R        03/16/23 2138   03/16/23 2138  Strep pneumoniae urinary antigen  Once,   R        03/16/23 2138   03/16/23 2136  CBC  (heparin)  Once,   R       Comments: Baseline for heparin therapy IF NOT ALREADY DRAWN.  Notify MD if PLT < 100 K.    03/16/23 2138   Signed and Held  Renal function panel  Once,   R       Question:  Specimen collection method  Answer:  Lab=Lab collect    Signed and Held   Signed and Held  CBC  Once,   R       Question:  Specimen collection method  Answer:  Lab=Lab collect   Signed and Held          Data Reviewed: I have personally reviewed following labs and imaging studies CBC: Recent Labs  Lab 03/16/23 2259 03/17/23 1430 03/18/23 0745 03/19/23 1458 03/20/23 1047  WBC 12.4* 15.3* 12.6* 10.3 9.4  NEUTROABS 9.6*  --   --  6.9  --   HGB 10.1* 10.1* 10.5* 10.0* 11.4*  HCT 33.9* 33.1* 33.6* 33.0* 37.1  MCV 100.0 98.5 96.3 97.9 98.1  PLT 220 226 219 208 203   Basic Metabolic Panel:  Recent Labs  Lab 03/16/23 2259 03/17/23 1430 03/18/23 0745 03/19/23 1458 03/20/23 1047  NA 133* 133* 131* 132* 133*  K 5.3* 6.0* 4.8 4.8 4.4  CL 95* 96* 94* 94* 97*  CO2 20* 20* 21* 24 21*  GLUCOSE 132* 119* 112* 82 62*  BUN 63* 65* 40* 56* 33*  CREATININE 12.88* 14.09* 8.68* 10.82* 7.06*  CALCIUM 9.1 9.4 10.0 8.9 9.1  MG  --   --  2.2  --  2.2  PHOS  --   --  7.3*  --  6.0*   GFR: Estimated Creatinine Clearance: 8.1 mL/min (A) (by C-G formula based on SCr of 7.06 mg/dL (H)). Liver Function Tests:  Recent Labs  Lab 03/16/23 1140 03/16/23 2259 03/17/23 1430 03/20/23 1047  AST 20 19 16   --   ALT 17 17 15   --   ALKPHOS 74 63 63  --   BILITOT 1.0 0.8 0.7  --   PROT 7.4 6.7 7.2  --   ALBUMIN 3.2* 2.9* 3.4* 3.3*   No results for input(s): "LIPASE", "AMYLASE" in the last 168 hours.  Recent Labs  Lab 03/16/23 2259  AMMONIA 32   Coagulation Profile: No results for input(s): "INR", "PROTIME" in the last 168 hours. No results for input(s): "PROBNP" in the last 168 hours.  No results for input(s): "HGBA1C" in the last 72 hours.  Recent Labs  Lab 03/18/23 0943 03/20/23 0736 03/20/23 1349 03/20/23 1436 03/21/23 0710  GLUCAP 135* 74 61* 105* 69*   No results for input(s): "  CHOL", "HDL", "LDLCALC", "TRIG", "CHOLHDL", "LDLDIRECT" in the last 72 hours. No results for input(s): "TSH", "T4TOTAL", "FREET4", "T3FREE", "THYROIDAB" in the  last 72 hours.  Sepsis Labs: Recent Labs  Lab 03/16/23 1038 03/16/23 2259 03/17/23 0040 03/17/23 0153  PROCALCITON  --  0.39  --   --   LATICACIDVEN 1.3  --  1.2 1.2   Recent Results (from the past 240 hours)  Resp panel by RT-PCR (RSV, Flu A&B, Covid) Anterior Nasal Swab     Status: None   Collection Time: 03/16/23 10:23 AM   Specimen: Anterior Nasal Swab  Result Value Ref Range Status   SARS Coronavirus 2 by RT PCR NEGATIVE NEGATIVE Final   Influenza A by PCR NEGATIVE NEGATIVE Final   Influenza B by PCR NEGATIVE NEGATIVE Final    Comment: (NOTE) The Xpert Xpress SARS-CoV-2/FLU/RSV plus assay is intended as an aid in the diagnosis of influenza from Nasopharyngeal swab specimens and should not be used as a sole basis for treatment. Nasal washings and aspirates are unacceptable for Xpert Xpress SARS-CoV-2/FLU/RSV testing.  Fact Sheet for Patients: BloggerCourse.com  Fact Sheet for Healthcare Providers: SeriousBroker.it  This test is not yet approved or cleared by the [place] FDA and has been authorized for detection and/or diagnosis of SARS-CoV-2 by FDA under an Emergency Use Authorization (EUA). This EUA will remain in effect (meaning this test can be used) for the duration of the COVID-19 declaration under Section 564(b)(1) of the Act, 21 U.S.C. section 360bbb-3(b)(1), unless the authorization is terminated or revoked.     Resp Syncytial Virus by PCR NEGATIVE NEGATIVE Final    Comment: (NOTE) Fact Sheet for Patients: BloggerCourse.com  Fact Sheet for Healthcare Providers: SeriousBroker.it  This test is not yet approved or cleared by the [place] FDA and has been authorized for detection and/or diagnosis of SARS-CoV-2 by FDA under an Emergency Use Authorization (EUA). This EUA will remain in effect (meaning this test can be used) for the duration of  the COVID-19 declaration under Section 564(b)(1) of the Act, 21 U.S.C. section 360bbb-3(b)(1), unless the authorization is terminated or revoked.  Performed at [facility], [address] [place], [place] [address]   Culture, blood (Routine X 2) w Reflex to ID Panel     Status: None (Preliminary result)   Collection Time: 03/17/23  1:53 AM   Specimen: BLOOD LEFT HAND  Result Value Ref Range Status   Specimen Description BLOOD LEFT HAND  Final   Special Requests   Final    BOTTLES DRAWN AEROBIC AND ANAEROBIC Blood Culture results may not be optimal due to an inadequate volume of blood received in culture bottles   Culture   Final    NO GROWTH 3 DAYS Performed at [facility], [address] [place], [place] [address]    Report Status PENDING  Incomplete  MRSA Next Gen by PCR, Nasal     Status: None   Collection Time: 03/17/23  9:25 PM   Specimen: Nasal Mucosa; Nasal Swab  Result Value Ref Range Status   MRSA by PCR Next Gen NOT DETECTED NOT DETECTED Final    Comment: (NOTE) The GeneXpert MRSA Assay (FDA approved for NASAL specimens only), is one component of a comprehensive MRSA colonization surveillance program. It is not intended to diagnose MRSA infection nor to guide or monitor treatment for MRSA infections. Test performance is not FDA approved in patients less than 57 years old. Performed at [facility], [address] [place], [place] [address]     Antimicrobials/Microbiology: Anti-infectives (From admission, onward)    Start     Dose/Rate Route Frequency Ordered Stop   03/17/23 1000  azithromycin (ZITHROMAX) 500 mg in sodium chloride 0.9 % 250 mL IVPB  Status:  Discontinued        500 mg 250 mL/hr over 60 Minutes Intravenous Every 24 hours 03/17/23 0201 03/17/23 0801   03/17/23 1000  cefTRIAXone (ROCEPHIN) 1 g in sodium chloride 0.9 % 100 mL IVPB        1 g 200 mL/hr over 30 Minutes Intravenous Every 24 hours 03/17/23 0201     03/17/23 1000   azithromycin (ZITHROMAX) tablet 500 mg        500 mg Oral Daily 03/17/23 0801     03/16/23 1115  cefTRIAXone (ROCEPHIN) 1 g in sodium chloride 0.9 % 100 mL IVPB        1 g 200 mL/hr over 30 Minutes Intravenous  Once 03/16/23 1102 03/16/23 1314   03/16/23 1115  azithromycin (ZITHROMAX) 500 mg in sodium chloride 0.9 % 250 mL IVPB        500 mg 250 mL/hr over 60 Minutes Intravenous  Once 03/16/23 1102 03/16/23 1433         Component Value Date/Time   SDES BLOOD LEFT HAND 03/17/2023 0153   SPECREQUEST  03/17/2023 0153    BOTTLES DRAWN AEROBIC AND ANAEROBIC Blood Culture results may not be optimal due to an inadequate volume of blood received in culture bottles   CULT  03/17/2023 0153    NO GROWTH 3 DAYS Performed at [facility], [address] [place], [place] [address]    REPTSTATUS PENDING 03/17/2023 0153     Radiology Studies: VAS [place] UPPER EXTREMITY ARTERIAL DUPLEX Result Date: 03/20/2023  UPPER EXTREMITY DUPLEX STUDY Patient Name:  DONABELLE PICQUET  Date of Exam:   03/20/2023 Medical Rec #: [id number]        Accession #:    [id number] Date of Birth: 02/07/1971         Patient Gender: F Patient Age:   47 years Exam Location:  [facility] Procedure:      VAS [place] UPPER EXTREMITY ARTERIAL DUPLEX Referring Phys: Stephania Fragmin --------------------------------------------------------------------------------  Indications: Concern for vascular compromise. History:     Patient has a history of ischemic fingertips of RUE.  Risk Factors:  Hypertension, hyperlipidemia, Diabetes, no history of smoking,                prior MI, coronary artery disease. Other Factors: ESRD (HD), PAD (BLE AKA), CHF. LUE middle finger amputation Limitations: Patient very uncooperative. Patient sitting up (poor positioning),              falling asleep and continuously moving arms. Heavy arterial              calcification limiting visualization of vessels. Comparison Study: Previous RUE arterial duplex on 12/03/21 &  LUE arterial duplex                   on 08/11/21. Performing Technologist: Ernestene Mention RVT, RDMS  Examination Guidelines: A complete evaluation includes B-mode imaging, spectral Doppler, color Doppler, and power Doppler as needed of all accessible portions of each vessel. Bilateral testing is considered an integral part of a complete examination. Limited examinations for reoccurring indications may be performed as noted.  Right Doppler Findings: +---------------+----------+----------+--------+--------+ Site  PSV (cm/s)Waveform  StenosisComments +---------------+----------+----------+--------+--------+ Subclavian Prox39        biphasic                   +---------------+----------+----------+--------+--------+ Subclavian Mid 32        monophasic                 +---------------+----------+----------+--------+--------+ Subclavian Dist          monophasic                 +---------------+----------+----------+--------+--------+ Axillary       31        biphasic                   +---------------+----------+----------+--------+--------+ Brachial Prox  34        biphasic                   +---------------+----------+----------+--------+--------+ Brachial Dist  34        biphasic                   +---------------+----------+----------+--------+--------+ Radial Prox    18        monophasic                 +---------------+----------+----------+--------+--------+ Radial Mid     9         monophasic                 +---------------+----------+----------+--------+--------+ Radial Dist    11        monophasic                 +---------------+----------+----------+--------+--------+ Ulnar Prox     45        monophasic                 +---------------+----------+----------+--------+--------+ Ulnar Mid      42        monophasic                 +---------------+----------+----------+--------+--------+ Ulnar Dist     42        monophasic                  +---------------+----------+----------+--------+--------+ Palmar Arch    13        monophasic                 +---------------+----------+----------+--------+--------+    Left Doppler Findings: +---------------+----------+----------+--------+-------------------------------+ Site           PSV (cm/s)Waveform  StenosisComments                        +---------------+----------+----------+--------+-------------------------------+ Subclavian Prox                            not visualized - HD             +---------------+----------+----------+--------+-------------------------------+ Subclavian Mid 23        monophasic                                        +---------------+----------+----------+--------+-------------------------------+ Axillary       27        monophasic                                        +---------------+----------+----------+--------+-------------------------------+  Brachial Prox  41        monophasic                                        +---------------+----------+----------+--------+-------------------------------+ Brachial Dist  31        monophasic                                        +---------------+----------+----------+--------+-------------------------------+ Radial Prox    40        monophasic                                        +---------------+----------+----------+--------+-------------------------------+ Radial Mid                                 IV palcement - not visualized   +---------------+----------+----------+--------+-------------------------------+ Radial Dist    45        monophasic                                        +---------------+----------+----------+--------+-------------------------------+ Ulnar Prox                                 unable to insonate - poor                                                  positioning                      +---------------+----------+----------+--------+-------------------------------+ Ulnar Mid      9         monophasic                                        +---------------+----------+----------+--------+-------------------------------+ Ulnar Dist     21        monophasic                                        +---------------+----------+----------+--------+-------------------------------+ Palmar Arch                                unable to insonate              +---------------+----------+----------+--------+-------------------------------+ Very limited due to patient turned away / leaning forward. Continuous movement of arm due to patient falling in and out of sleep.  Summary:  Bilateral: No obvious evidence of significant stenosis or occlusion,            however very difficult and limited study due to heavy            arterial calcification and patient condition. *See table(s)  above for measurements and observations.    Preliminary      LOS: 5 days   Total time spent in review of labs and imaging, patient evaluation, formulation of plan, documentation and communication with family: 25 minutes  Lanae Boast, MD [facility]  03/21/2023, 10:13 AM

## 2023-03-21 NOTE — Progress Notes (Addendum)
Courtney Gray Progress Note   Subjective: No new issues reported overnight. HD later this evening. Resume MWF next week. Daughter at bedside.   Objective Vitals:   03/21/23 0328 03/21/23 0429 03/21/23 0748 03/21/23 1151  BP:  (!) 135/98    Pulse:  72    Resp:  17    Temp: (!) 97.4 F (36.3 C)  98.7 F (37.1 C) (!) 97.5 F (36.4 C)  TempSrc: Oral  Oral Oral  SpO2:      Weight:      Height:       Physical Exam General: Chronically ill appearing female Neuro: A & O X 3. Appears at baseline Heart: HS distant. S1.S2 + Systolic M.  Lungs: CTAB A/P Abdomen: Obese, NABS, Dressing on abdomin Extremities: Bilateral AKA Dialysis Access: RIJ TDC Drsg intact    Additional Objective Labs: Basic Metabolic Panel: Recent Labs  Lab 03/18/23 0745 03/19/23 1458 03/20/23 1047  NA 131* 132* 133*  K 4.8 4.8 4.4  CL 94* 94* 97*  CO2 21* 24 21*  GLUCOSE 112* 82 62*  BUN 40* 56* 33*  CREATININE 8.68* 10.82* 7.06*  CALCIUM 10.0 8.9 9.1  PHOS 7.3*  --  6.0*   Liver Function Tests: Recent Labs  Lab 03/16/23 1140 03/16/23 2259 03/17/23 1430 03/20/23 1047  AST 20 19 16   --   ALT 17 17 15   --   ALKPHOS 74 63 63  --   BILITOT 1.0 0.8 0.7  --   PROT 7.4 6.7 7.2  --   ALBUMIN 3.2* 2.9* 3.4* 3.3*   No results for input(s): "LIPASE", "AMYLASE" in the last 168 hours. CBC: Recent Labs  Lab 03/16/23 2259 03/17/23 1430 03/18/23 0745 03/19/23 1458 03/20/23 1047  WBC 12.4* 15.3* 12.6* 10.3 9.4  NEUTROABS 9.6*  --   --  6.9  --   HGB 10.1* 10.1* 10.5* 10.0* 11.4*  HCT 33.9* 33.1* 33.6* 33.0* 37.1  MCV 100.0 98.5 96.3 97.9 98.1  PLT 220 226 219 208 203   Blood Culture    Component Value Date/Time   SDES BLOOD LEFT HAND 03/17/2023 0153   SPECREQUEST  03/17/2023 0153    BOTTLES DRAWN AEROBIC AND ANAEROBIC Blood Culture results may not be optimal due to an inadequate volume of blood received in culture bottles   CULT  03/17/2023 0153    NO GROWTH 3 DAYS Performed  at [facility], [address], [place], [place] [address]    REPTSTATUS PENDING 03/17/2023 0153    Cardiac Enzymes: No results for input(s): "CKTOTAL", "CKMB", "CKMBINDEX", "TROPONINI" in the last 168 hours. CBG: Recent Labs  Lab 03/18/23 0943 03/20/23 0736 03/20/23 1349 03/20/23 1436 03/21/23 0710  GLUCAP 135* 74 61* 105* 69*   Iron Studies: No results for input(s): "IRON", "TIBC", "TRANSFERRIN", "FERRITIN" in the last 72 hours. @lablastinr3 @ Studies/Results: VAS [place] UPPER EXTREMITY ARTERIAL DUPLEX Result Date: 03/21/2023  UPPER EXTREMITY DUPLEX STUDY Patient Name:  Courtney Gray  Date of Exam:   03/20/2023 Medical Rec #: [id number]        Accession #:    [id number] Date of Birth: 07/25/1970         Patient Gender: F Patient Age:   52 years Exam Location:  [facility] Procedure:      VAS [place] UPPER EXTREMITY ARTERIAL DUPLEX Referring Phys: Stephania Fragmin --------------------------------------------------------------------------------  Indications: Concern for vascular compromise. History:     Patient has a history of ischemic fingertips of RUE.  Risk Factors:  Hypertension, hyperlipidemia, Diabetes, no history of smoking,                prior MI, coronary artery disease. Other Factors: ESRD (HD), PAD (BLE AKA), CHF. LUE middle finger amputation Limitations: Patient very uncooperative. Patient sitting up (poor positioning),              falling asleep and continuously moving arms. Heavy arterial              calcification limiting visualization of vessels. Comparison Study: Previous RUE arterial duplex on 12/03/21 & LUE arterial duplex                   on 08/11/21. Performing Technologist: Ernestene Mention RVT, RDMS  Examination Guidelines: A complete evaluation includes B-mode imaging, spectral Doppler, color Doppler, and power Doppler as needed of all accessible portions of each vessel. Bilateral testing is considered an integral part of a complete examination. Limited examinations for  reoccurring indications may be performed as noted.  Right Doppler Findings: +---------------+----------+----------+--------+--------+ Site           PSV (cm/s)Waveform  StenosisComments +---------------+----------+----------+--------+--------+ Subclavian Prox39        biphasic                   +---------------+----------+----------+--------+--------+ Subclavian Mid 32        monophasic                 +---------------+----------+----------+--------+--------+ Subclavian Dist          monophasic                 +---------------+----------+----------+--------+--------+ Axillary       31        biphasic                   +---------------+----------+----------+--------+--------+ Brachial Prox  34        biphasic                   +---------------+----------+----------+--------+--------+ Brachial Dist  34        biphasic                   +---------------+----------+----------+--------+--------+ Radial Prox    18        monophasic                 +---------------+----------+----------+--------+--------+ Radial Mid     9         monophasic                 +---------------+----------+----------+--------+--------+ Radial Dist    11        monophasic                 +---------------+----------+----------+--------+--------+ Ulnar Prox     45        monophasic                 +---------------+----------+----------+--------+--------+ Ulnar Mid      42        monophasic                 +---------------+----------+----------+--------+--------+ Ulnar Dist     42        monophasic                 +---------------+----------+----------+--------+--------+ Palmar Arch    13        monophasic                 +---------------+----------+----------+--------+--------+  Left Doppler Findings: +---------------+----------+----------+--------+-------------------------------+ Site           PSV (cm/s)Waveform  StenosisComments                         +---------------+----------+----------+--------+-------------------------------+ Subclavian Prox                            not visualized - HD             +---------------+----------+----------+--------+-------------------------------+ Subclavian Mid 23        monophasic                                        +---------------+----------+----------+--------+-------------------------------+ Axillary       27        monophasic                                        +---------------+----------+----------+--------+-------------------------------+ Brachial Prox  41        monophasic                                        +---------------+----------+----------+--------+-------------------------------+ Brachial Dist  31        monophasic                                        +---------------+----------+----------+--------+-------------------------------+ Radial Prox    40        monophasic                                        +---------------+----------+----------+--------+-------------------------------+ Radial Mid                                 IV palcement - not visualized   +---------------+----------+----------+--------+-------------------------------+ Radial Dist    45        monophasic                                        +---------------+----------+----------+--------+-------------------------------+ Ulnar Prox                                 unable to insonate - poor                                                  positioning                     +---------------+----------+----------+--------+-------------------------------+ Ulnar Mid      9         monophasic                                        +---------------+----------+----------+--------+-------------------------------+  Ulnar Dist     21        monophasic                                        +---------------+----------+----------+--------+-------------------------------+  Palmar Arch                                unable to insonate              +---------------+----------+----------+--------+-------------------------------+ Very limited due to patient turned away / leaning forward. Continuous movement of arm due to patient falling in and out of sleep.  Summary:  Bilateral: No obvious evidence of significant stenosis or occlusion,            however very difficult and limited study due to heavy            arterial calcification and patient condition. *See table(s) above for measurements and observations. Electronically signed by Heath Lark on 03/21/2023 at 12:30:47 PM.    Final    Medications:  cefTRIAXone (ROCEPHIN)  IV 1 g (03/21/23 1107)   sodium thiosulfate 25 g in sodium chloride 0.9 % 200 mL Infusion for Calciphylaxis      aspirin EC  81 mg Oral Daily   atorvastatin  10 mg Oral Daily   azithromycin  500 mg Oral Daily   famotidine  20 mg Oral Daily   heparin  5,000 Units Subcutaneous Q8H   heparin  9,000 Units Dialysis Once in dialysis   levothyroxine  100 mcg Oral Daily   midodrine  10 mg Oral Q M,W,F-HD   pantoprazole  40 mg Oral QHS   saccharomyces boulardii  250 mg Oral BID   senna  2 tablet Oral Daily   sucroferric oxyhydroxide  500 mg Oral TID WC     HD orders: AF MWF 4 hrs 180NRe 400/Autoflow 1.5 119.8 kg 2.0 K/2.0 Ca UFP 2 TDC - Heparin 9000 units IV three times per week - Hectorol 3 mcg IV three times per week Order needs to be discontinued 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 Would hold these both for now. Improved post HD. Max gabapentin dose for ESRD patient is 300mg  daily, would completely avoid gabapentin and lyrica for  now. Resolved.   ESKD - on HD MWF. Currently off schedule. HD 03/21/2023 then will resume MWF 03/23/2023 BP - not on any BP lowering meds, midodrine pre HD  Volume - no vol excess on exam.  Anemia of eskd - Hb 10-12 here, follow.  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 Continue wound care Ischemic fingers R hand: Work up per [facility]. High risk for amputation of digits per [facility] note Disposition: wants to seek different SNF placement. MSW involved.   Courtney Stines H. Mckena Chern NP-C 03/21/2023, 1:41 PM  [employer] [phone]

## 2023-03-21 NOTE — Plan of Care (Signed)

## 2023-03-22 DIAGNOSIS — I950 Idiopathic hypotension: Secondary | ICD-10-CM | POA: Diagnosis not present

## 2023-03-22 LAB — CULTURE, BLOOD (ROUTINE X 2): Culture: NO GROWTH

## 2023-03-22 LAB — GLUCOSE, CAPILLARY
Glucose-Capillary: 68 mg/dL — ABNORMAL LOW (ref 70–99)
Glucose-Capillary: 85 mg/dL (ref 70–99)
Glucose-Capillary: 87 mg/dL (ref 70–99)
Glucose-Capillary: 93 mg/dL (ref 70–99)

## 2023-03-22 MED ORDER — OXYCODONE-ACETAMINOPHEN 7.5-325 MG PO TABS
1.0000 | ORAL_TABLET | Freq: Four times a day (QID) | ORAL | Status: DC | PRN
  Administered 2023-03-22 – 2023-03-25 (×8): 1 via ORAL
  Filled 2023-03-22 (×9): qty 1

## 2023-03-22 MED ORDER — TRAMADOL HCL 50 MG PO TABS
50.0000 mg | ORAL_TABLET | Freq: Once | ORAL | Status: AC
  Administered 2023-03-22: 50 mg via ORAL
  Filled 2023-03-22: qty 1

## 2023-03-22 NOTE — Progress Notes (Signed)
PROGRESS NOTE Courtney Gray  WUJ:[id number] DOB: 12/27/1970 DOA: 03/16/2023 PCP: Default, Provider, MD  Brief Narrative/Hospital Course: 52 year old with history of HTN, ESRD on HD MWF, NSTEMI, obesity, DM2 status post bilateral AKA, PAD, chronic hypoxia, hypothyroidism comes to the ED for fatigue and change in mental status started prior to the day of admission.  Chest x-ray showed possible pneumonia versus atelectasis.  CT of the head was negative.  Started on Rocephin and azithromycin.  Patient is also had frequent hypoglycemic episodes-symptomatic with encephalopathy. Patient is from the facility as LTC and can return once medically stable-TOC following.  Patient was seen by vascular surgery for discolored fingers and pain likely chronic ischemia, underwent arterial duplex of upper extremities Patient is having discoloration and pain of her fingers underwent upper extremity arterial duplex bilateral no obvious evidence of significant stenosis or occlusion.  Seen by vascular surgery, patient aware high risk for finger amputation.  Subjective: Patient seen examined this morning Daughter at the bedside She complains of being tired No new complaints Complains of ongoing finger pain did not sleep well Blood sugar in 60s but asymptomatic  Assessment and Plan: Active Problems:   Hypoglycemia   Hyperkalemia   Idiopathic hypotension   Symptomatic Hypoglycemia with encephalopathy Symptomatic recurrent hypoglycemic episodes as low as 25.Apparently raising concern for adrenal insufficiency cosyntropin test 60 m cortisol 23- AI ruled out.Patient also has underlying infection, ESRD status polypharmacy and poor oral intake contributing to hypoglycemia. Off steroids. Keep on liberal carb diet daughter instructed advised to have bedside snacks-but she has very poor intake-encouraged PO. CBG holding >60s and asymptomatic. Her A1c is 5.1 no diabetes.. Recent Labs  Lab 03/16/23 2259 03/17/23 0039  03/20/23 1436 03/21/23 0710 03/21/23 1840 03/22/23 0123 03/22/23 0618  GLUCAP  --    < > 105* 69* 79 68* 85  HGBA1C 5.1  --   --   --   --   --   --    < > = values in this interval not displayed.   Acute encephalopathy,metabolic toxic: Multifactorial with infection,hypoglycemia, multiple sedative medications/pain medicine. Mental status improved but has poor baseline functional status.  Off fentanyl patch and multiple psychotropic medication:Flexeril, gabapentin, oxycodone, Lyrica.Advised her that anytime there is a change in medication needs to be reviewed by her nephrology.Marland Kitchen adding percocet 7.5 for pain.   Community-acquired pneumonia: CT scan showed pneumonia.  Completed antibiotics.  Continue I-S bronchodilators   Bilateral upper extremity darkening of fingertips Right index fingertip dark with some ulceration.  Does not appear to be calcification. This is likely from blood flow compromise but appears to be chronic, vascular consulted arterial duplex- no acute finding await further plan  ?angiogram.Not typical for calciphylaxis continue wound care.  Increasing Percocet 5> 7.5 mg for pain control    ESRD MWF Hyperkalemia: Metabolic bone disease History of calciphylaxis:  continue wound care-abdominal wound healing-patient resumed on sodium thiosulfate 3 times a week Continue HD per nephrology cont pre HD midodrine, will check electrolytes   Congestive heart failure with reduced EF, 35% TTE 8/24 shows EF 35% with global hypokinesia.  Volume status managed W/ hd   Anemia chronic kidney disease Hemoglobin currently stable around 10.0. trend labs Recent Labs  Lab 03/16/23 2259 03/17/23 1430 03/18/23 0745 03/19/23 1458 03/20/23 1047  HGB 10.1* 10.1* 10.5* 10.0* 11.4*  HCT 33.9* 33.1* 33.6* 33.0* 37.1    Hypothyroidism:  TSH is stable continue home Synthroid.    Essential hypertension: BP soft.Adrenal insufficiency ruled out.  Antihypertensives on hold.  PAD s/p B/l  BKA cont spirin and statin   GERD Protonix daily.  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 (Active)  03/17/23 2000  Location: Thigh  Location Orientation: Posterior;Proximal;Right  Staging: Stage 4 - Full thickness tissue loss with exposed bone, tendon or muscle.  Wound Description (Comments):   Present on Admission: Yes  Dressing Type Foam - Lift dressing to assess site every shift 03/17/23 2000   DVT prophylaxis: heparin injection 5,000 Units Start: 03/16/23 2200 Code Status:   Code Status: Full Code Family Communication: plan of care discussed with patient/daughter at bedside. Patient status is: Remains hospitalized because of severity of illness Level of care: Med-Surg   Dispo:The patient is from:[facility]            Anticipated disposition: LTACH vs snf ( new) Once ok /w vascular.  Objective: Vitals last 24 hrs: Vitals:   03/21/23 2330 03/22/23 0100 03/22/23 0122 03/22/23 0614  BP: (!) 125/48  (!) 124/54 (!) 141/71  Pulse:   77 73  Resp: 15  18 18   Temp:  98.8 F (37.1 C) 98 F (36.7 C)   TempSrc:  Oral    SpO2:   100% 100%  Weight:      Height:       Weight change:   Physical Examination: General exam: alert awake, oriented. HEENT:Oral mucosa moist, Ear/Nose WNL grossly Respiratory system: Bilaterally clear BS,no use of accessory muscle Cardiovascular system: S1 & S2 +, No JVD. Gastrointestinal system: Abdomen soft,NT,ND, BS+ Nervous System: Alert, awake, moving  UE, LE amputte Extremities: LE edema neg,distal peripheral pulses palpable and warm.  Skin: No rashes,no icterus. MSK: Normal muscle bulk,tone, power   Medications reviewed:  Scheduled Meds:  aspirin EC  81 mg Oral Daily   atorvastatin  10 mg Oral Daily   famotidine  20 mg Oral Daily   feeding supplement (NEPRO CARB STEADY)  237 mL Oral BID BM    heparin  5,000 Units Subcutaneous Q8H   levothyroxine  100 mcg Oral Daily   midodrine  10 mg Oral Q M,W,F-HD   multivitamin  1 tablet Oral QHS   pantoprazole  40 mg Oral QHS   saccharomyces boulardii  250 mg Oral BID   senna  2 tablet Oral Daily   sucroferric oxyhydroxide  500 mg Oral TID WC   Continuous Infusions:  sodium thiosulfate 25 g in sodium chloride 0.9 % 200 mL Infusion for Calciphylaxis     sodium thiosulfate 25 g in sodium chloride 0.9 % 200 mL Infusion for Calciphylaxis     Diet Order             Diet renal with fluid restriction Fluid restriction: 1200 mL Fluid; Room service appropriate? Yes; Fluid consistency: Thin  Diet effective now                  Intake/Output Summary (Last 24 hours) at 03/22/2023 1117 Last data filed at 03/22/2023 0600 Gross per 24 hour  Intake 540 ml  Output 3500 ml  Net -2960 ml   Net IO Since Admission: -3,116 mL [03/22/23 1117]  Wt Readings from Last 3 Encounters:  03/20/23 121.5 kg  12/02/22 117.9 kg  11/13/22 120.9 kg     Unresulted Labs (From admission, onward)     Start     Ordered   03/18/23 0500  Basic metabolic panel  Daily,   R     Question:  Specimen collection method  Answer:  IV Team=IV Team collect   03/17/23 0929   03/16/23 2138  Expectorated Sputum Assessment w Gram Stain, Rflx to Resp Cult  Once,   R        03/16/23 2138   03/16/23 2136  CBC  (heparin)  Once,   R       Comments: Baseline for heparin therapy IF NOT ALREADY DRAWN.  Notify MD if PLT < 100 K.    03/16/23 2138   Signed and Held  Renal function panel  Once,   R       Question:  Specimen collection method  Answer:  Lab=Lab collect   Signed and Held   Signed and Held  CBC  Once,   R       Question:  Specimen collection method  Answer:  Lab=Lab collect   Signed and Held          Data Reviewed: I have personally reviewed following labs and imaging studies CBC: Recent Labs  Lab 03/16/23 2259 03/17/23 1430 03/18/23 0745 03/19/23 1458  03/20/23 1047  WBC 12.4* 15.3* 12.6* 10.3 9.4  NEUTROABS 9.6*  --   --  6.9  --   HGB 10.1* 10.1* 10.5* 10.0* 11.4*  HCT 33.9* 33.1* 33.6* 33.0* 37.1  MCV 100.0 98.5 96.3 97.9 98.1  PLT 220 226 219 208 203   Basic Metabolic Panel:  Recent Labs  Lab 03/16/23 2259 03/17/23 1430 03/18/23 0745 03/19/23 1458 03/20/23 1047  NA 133* 133* 131* 132* 133*  K 5.3* 6.0* 4.8 4.8 4.4  CL 95* 96* 94* 94* 97*  CO2 20* 20* 21* 24 21*  GLUCOSE 132* 119* 112* 82 62*  BUN 63* 65* 40* 56* 33*  CREATININE 12.88* 14.09* 8.68* 10.82* 7.06*  CALCIUM 9.1 9.4 10.0 8.9 9.1  MG  --   --  2.2  --  2.2  PHOS  --   --  7.3*  --  6.0*   GFR: Estimated Creatinine Clearance: 8.1 mL/min (A) (by C-G formula based on SCr of 7.06 mg/dL (H)). Liver Function Tests:  Recent Labs  Lab 03/16/23 1140 03/16/23 2259 03/17/23 1430 03/20/23 1047  AST 20 19 16   --   ALT 17 17 15   --   ALKPHOS 74 63 63  --   BILITOT 1.0 0.8 0.7  --   PROT 7.4 6.7 7.2  --   ALBUMIN 3.2* 2.9* 3.4* 3.3*   No results for input(s): "LIPASE", "AMYLASE" in the last 168 hours.  Recent Labs  Lab 03/16/23 2259  AMMONIA 32   Coagulation Profile: No results for input(s): "INR", "PROTIME" in the last 168 hours. No results for input(s): "PROBNP" in the last 168 hours.  No results for input(s): "HGBA1C" in the last 72 hours.  Recent Labs  Lab 03/20/23 1436 03/21/23 0710 03/21/23 1840 03/22/23 0123 03/22/23 0618  GLUCAP 105* 69* 79 68* 85   No results for input(s): "CHOL", "HDL", "LDLCALC", "TRIG", "CHOLHDL", "LDLDIRECT" in the last 72 hours. No results for input(s): "TSH", "T4TOTAL", "FREET4", "T3FREE", "THYROIDAB" in the last 72 hours.  Sepsis Labs: Recent Labs  Lab 03/16/23 1038 03/16/23 2259 03/17/23 0040 03/17/23 0153  PROCALCITON  --  0.39  --   --   LATICACIDVEN 1.3  --  1.2 1.2   Recent Results (from the past 240 hours)  Resp panel by RT-PCR (RSV, Flu A&B, Covid) Anterior Nasal Swab  Status: None   Collection  Time: 03/16/23 10:23 AM   Specimen: Anterior Nasal Swab  Result Value Ref Range Status   SARS Coronavirus 2 by RT PCR NEGATIVE NEGATIVE Final   Influenza A by PCR NEGATIVE NEGATIVE Final   Influenza B by PCR NEGATIVE NEGATIVE Final    Comment: (NOTE) The Xpert Xpress SARS-CoV-2/FLU/RSV plus assay is intended as an aid in the diagnosis of influenza from Nasopharyngeal swab specimens and should not be used as a sole basis for treatment. Nasal washings and aspirates are unacceptable for Xpert Xpress SARS-CoV-2/FLU/RSV testing.  Fact Sheet for Patients: BloggerCourse.com  Fact Sheet for Healthcare Providers: SeriousBroker.it  This test is not yet approved or cleared by the [place] FDA and has been authorized for detection and/or diagnosis of SARS-CoV-2 by FDA under an Emergency Use Authorization (EUA). This EUA will remain in effect (meaning this test can be used) for the duration of the COVID-19 declaration under Section 564(b)(1) of the Act, 21 U.S.C. section 360bbb-3(b)(1), unless the authorization is terminated or revoked.     Resp Syncytial Virus by PCR NEGATIVE NEGATIVE Final    Comment: (NOTE) Fact Sheet for Patients: BloggerCourse.com  Fact Sheet for Healthcare Providers: SeriousBroker.it  This test is not yet approved or cleared by the [place] FDA and has been authorized for detection and/or diagnosis of SARS-CoV-2 by FDA under an Emergency Use Authorization (EUA). This EUA will remain in effect (meaning this test can be used) for the duration of the COVID-19 declaration under Section 564(b)(1) of the Act, 21 U.S.C. section 360bbb-3(b)(1), unless the authorization is terminated or revoked.  Performed at [facility], [address] [place], [place] [address]   Culture, blood (Routine X 2) w Reflex to ID Panel     Status: None   Collection Time:  03/17/23  1:53 AM   Specimen: BLOOD LEFT HAND  Result Value Ref Range Status   Specimen Description BLOOD LEFT HAND  Final   Special Requests   Final    BOTTLES DRAWN AEROBIC AND ANAEROBIC Blood Culture results may not be optimal due to an inadequate volume of blood received in culture bottles   Culture   Final    NO GROWTH 5 DAYS Performed at [facility], [address] [place], [place] [address]    Report Status 03/22/2023 FINAL  Final  MRSA Next Gen by PCR, Nasal     Status: None   Collection Time: 03/17/23  9:25 PM   Specimen: Nasal Mucosa; Nasal Swab  Result Value Ref Range Status   MRSA by PCR Next Gen NOT DETECTED NOT DETECTED Final    Comment: (NOTE) The GeneXpert MRSA Assay (FDA approved for NASAL specimens only), is one component of a comprehensive MRSA colonization surveillance program. It is not intended to diagnose MRSA infection nor to guide or monitor treatment for MRSA infections. Test performance is not FDA approved in patients less than 16 years old. Performed at [facility], [address], [place], [place] [address]     Antimicrobials/Microbiology: Anti-infectives (From admission, onward)    Start     Dose/Rate Route Frequency Ordered Stop   03/17/23 1000  azithromycin (ZITHROMAX) 500 mg in sodium chloride 0.9 % 250 mL IVPB  Status:  Discontinued        500 mg 250 mL/hr over 60 Minutes Intravenous Every 24 hours 03/17/23 0201 03/17/23 0801   03/17/23 1000  cefTRIAXone (ROCEPHIN) 1 g in sodium chloride 0.9 % 100  mL IVPB        1 g 200 mL/hr over 30 Minutes Intravenous Every 24 hours 03/17/23 0201 03/21/23 1844   03/17/23 1000  azithromycin (ZITHROMAX) tablet 500 mg        500 mg Oral Daily 03/17/23 0801 03/21/23 1100   03/16/23 1115  cefTRIAXone (ROCEPHIN) 1 g in sodium chloride 0.9 % 100 mL IVPB        1 g 200 mL/hr over 30 Minutes Intravenous  Once 03/16/23 1102 03/16/23 1314   03/16/23 1115  azithromycin (ZITHROMAX) 500 mg in sodium  chloride 0.9 % 250 mL IVPB        500 mg 250 mL/hr over 60 Minutes Intravenous  Once 03/16/23 1102 03/16/23 1433         Component Value Date/Time   SDES BLOOD LEFT HAND 03/17/2023 0153   SPECREQUEST  03/17/2023 0153    BOTTLES DRAWN AEROBIC AND ANAEROBIC Blood Culture results may not be optimal due to an inadequate volume of blood received in culture bottles   CULT  03/17/2023 0153    NO GROWTH 5 DAYS Performed at [facility], [address], [place], [place] [address]    REPTSTATUS 03/22/2023 FINAL 03/17/2023 0153     Radiology Studies: No results found.    LOS: 6 days   Total time spent in review of labs and imaging, patient evaluation, formulation of plan, documentation and communication with family: 25 minutes  Lanae Boast, MD [facility]  03/22/2023, 11:17 AM

## 2023-03-22 NOTE — Plan of Care (Signed)

## 2023-03-22 NOTE — Progress Notes (Signed)
Patient refused housekeeping stated she didn't want to be bothered. I advised housekeeping to come back later and try again.

## 2023-03-22 NOTE — TOC Progression Note (Signed)
Transition of Care [facility]) - Progression Note    Patient Details  Name: Courtney Gray MRN: [id number] Date of Birth: 1970/12/09  Transition of Care [facility]) CM/SW Contact  Dellie Burns [place], [place] Phone Number: 03/22/2023, 2:40 PM  Clinical Narrative: pt admitted from [facility] where she is a LTC/SNF resident. Pt's family requesting new SNF placement if possible but agreeable for pt to return to [facility] if needed. Pt with one additional offer from [facility] (sister facility to [facility]). Spoke to Hunter in admissions who is uncertain if there are LTC beds at [facility] and requested SW f/u with her Monday. Wynona Canes did confirm pt is able to return to [facility] at [facility].   Dellie Burns, MSW, LCSW [phone] (coverage)        Expected Discharge Plan: Skilled Nursing Facility Barriers to Discharge: Continued Medical Work up  Expected Discharge Plan and Services                                               Social Determinants of Health (SDOH) Interventions SDOH Screenings   Food Insecurity: No Food Insecurity (03/16/2023)  Housing: Low Risk  (03/16/2023)  Transportation Needs: No Transportation Needs (03/16/2023)  Utilities: Not At Risk (03/16/2023)  Financial Resource Strain: High Risk (03/27/2018)   Received from [facility]  Social Connections: Unknown (08/20/2021)   Received from [facility], [facility]  Tobacco Use: Low Risk  (03/16/2023)  Health Literacy: Low Risk  (11/25/2022)   Received from [facility]    Readmission Risk Interventions    09/16/2021    4:36 PM 08/28/2021   11:50 AM 07/12/2021   12:38 PM  Readmission Risk Prevention Plan  Transportation Screening Complete Complete Complete  Medication Review (RN Care Manager)  Complete Complete  PCP or Specialist appointment within 3-5 days of discharge  Complete Complete  HRI or Home Care Consult Complete Complete Complete  SW Recovery  Care/Counseling Consult Complete Complete Complete  Palliative Care Screening  Not Applicable Not Applicable  Skilled Nursing Facility Complete Not Applicable Complete

## 2023-03-22 NOTE — Progress Notes (Signed)
[facility] Progress Note   Subjective: HD last PM net UF 3.5. Short HD tomorrow to change back to MWF schedule. Refusing care for hypoglycemia. Unable to DC until SNF placement confirmed.   Objective Vitals:   03/21/23 2330 03/22/23 0100 03/22/23 0122 03/22/23 0614  BP: (!) 125/48  (!) 124/54 (!) 141/71  Pulse:   77 73  Resp: 15  18 18   Temp:  98.8 F (37.1 C) 98 F (36.7 C)   TempSrc:  Oral    SpO2:   100% 100%  Weight:      Height:       Physical Exam General: Chronically ill appearing female Neuro: A & O X 3. Appears at baseline Heart: HS distant. S1.S2 + Systolic M.  Lungs: CTAB A/P Abdomen: Obese, NABS, Dressing on abdomin Extremities: Bilateral AKA. ischemic fingertips to the right hand.  Dialysis Access: RIJ [facility] Drsg intact    Additional Objective Labs: Basic Metabolic Panel: Recent Labs  Lab 03/18/23 0745 03/19/23 1458 03/20/23 1047  NA 131* 132* 133*  K 4.8 4.8 4.4  CL 94* 94* 97*  CO2 21* 24 21*  GLUCOSE 112* 82 62*  BUN 40* 56* 33*  CREATININE 8.68* 10.82* 7.06*  CALCIUM 10.0 8.9 9.1  PHOS 7.3*  --  6.0*   Liver Function Tests: Recent Labs  Lab 03/16/23 1140 03/16/23 2259 03/17/23 1430 03/20/23 1047  AST 20 19 16   --   ALT 17 17 15   --   ALKPHOS 74 63 63  --   BILITOT 1.0 0.8 0.7  --   PROT 7.4 6.7 7.2  --   ALBUMIN 3.2* 2.9* 3.4* 3.3*   No results for input(s): "LIPASE", "AMYLASE" in the last 168 hours. CBC: Recent Labs  Lab 03/16/23 2259 03/17/23 1430 03/18/23 0745 03/19/23 1458 03/20/23 1047  WBC 12.4* 15.3* 12.6* 10.3 9.4  NEUTROABS 9.6*  --   --  6.9  --   HGB 10.1* 10.1* 10.5* 10.0* 11.4*  HCT 33.9* 33.1* 33.6* 33.0* 37.1  MCV 100.0 98.5 96.3 97.9 98.1  PLT 220 226 219 208 203   Blood Culture    Component Value Date/Time   SDES BLOOD LEFT HAND 03/17/2023 0153   SPECREQUEST  03/17/2023 0153    BOTTLES DRAWN AEROBIC AND ANAEROBIC Blood Culture results may not be optimal due to an inadequate volume of  blood received in culture bottles   CULT  03/17/2023 0153    NO GROWTH 5 DAYS Performed at [facility], [address], [place], [place] [address]    REPTSTATUS 03/22/2023 FINAL 03/17/2023 0153    Cardiac Enzymes: No results for input(s): "CKTOTAL", "CKMB", "CKMBINDEX", "TROPONINI" in the last 168 hours. CBG: Recent Labs  Lab 03/20/23 1436 03/21/23 0710 03/21/23 1840 03/22/23 0123 03/22/23 0618  GLUCAP 105* 69* 79 68* 85   Iron Studies: No results for input(s): "IRON", "TIBC", "TRANSFERRIN", "FERRITIN" in the last 72 hours. @lablastinr3 @ Studies/Results: No results found. Medications:  sodium thiosulfate 25 g in sodium chloride 0.9 % 200 mL Infusion for Calciphylaxis     sodium thiosulfate 25 g in sodium chloride 0.9 % 200 mL Infusion for Calciphylaxis      aspirin EC  81 mg Oral Daily   atorvastatin  10 mg Oral Daily   famotidine  20 mg Oral Daily   feeding supplement (NEPRO CARB STEADY)  237 mL Oral BID BM   heparin  5,000 Units Subcutaneous Q8H   levothyroxine  100 mcg Oral Daily  midodrine  10 mg Oral Q M,W,F-HD   multivitamin  1 tablet Oral QHS   pantoprazole  40 mg Oral QHS   saccharomyces boulardii  250 mg Oral BID   senna  2 tablet Oral Daily   sucroferric oxyhydroxide  500 mg Oral TID WC     HD orders: AF MWF 4 hrs 180NRe 400/Autoflow 1.5 119.8 kg 2.0 K/2.0 Ca UFP 2 TDC - Heparin 9000 units IV three times per week - Hectorol 3 mcg IV three times per week Order needs to be discontinued 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 Would hold these both for now. Improved post HD. Max gabapentin dose for ESRD patient is 300mg  daily, would completely  avoid gabapentin and lyrica for now. Resolved.   Hypoglycemia-refusing finger stick BS, refusing food. Per primary ESKD - on HD MWF. Currently off schedule. HD 03/21/2023 then will resume MWF 03/23/2023 BP - not on any BP lowering meds, midodrine pre HD  Volume - no vol excess on exam.  Anemia of eskd - Hb 10-12 here, follow.  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 Continue wound care Ischemic fingers R hand: Work up per VVS. High risk for amputation of digits per [facility] note Disposition: wants to seek different SNF placement. MSW involved.   Alvin Rubano H. Shaida Route NP-C 03/22/2023, 12:35 PM  [employer] [phone]

## 2023-03-22 NOTE — Progress Notes (Signed)
Patient refused Blood sugar check and stated she is not eating anything to make it go up.

## 2023-03-22 NOTE — Plan of Care (Signed)
  Problem: Clinical Measurements: Goal: Cardiovascular complication will be avoided Outcome: Progressing   Problem: Activity: Goal: Risk for activity intolerance will decrease Outcome: Progressing   Problem: Nutrition: Goal: Adequate nutrition will be maintained Outcome: Progressing   

## 2023-03-23 DIAGNOSIS — I950 Idiopathic hypotension: Secondary | ICD-10-CM | POA: Diagnosis not present

## 2023-03-23 LAB — CBC WITH DIFFERENTIAL/PLATELET
Abs Immature Granulocytes: 0.06 10*3/uL (ref 0.00–0.07)
Basophils Absolute: 0.1 10*3/uL (ref 0.0–0.1)
Basophils Relative: 1 %
Eosinophils Absolute: 0.4 10*3/uL (ref 0.0–0.5)
Eosinophils Relative: 4 %
HCT: 34.7 % — ABNORMAL LOW (ref 36.0–46.0)
Hemoglobin: 10.6 g/dL — ABNORMAL LOW (ref 12.0–15.0)
Immature Granulocytes: 1 %
Lymphocytes Relative: 16 %
Lymphs Abs: 1.5 10*3/uL (ref 0.7–4.0)
MCH: 30.5 pg (ref 26.0–34.0)
MCHC: 30.5 g/dL (ref 30.0–36.0)
MCV: 99.7 fL (ref 80.0–100.0)
Monocytes Absolute: 1 10*3/uL (ref 0.1–1.0)
Monocytes Relative: 11 %
Neutro Abs: 6.6 10*3/uL (ref 1.7–7.7)
Neutrophils Relative %: 67 %
Platelets: 172 10*3/uL (ref 150–400)
RBC: 3.48 MIL/uL — ABNORMAL LOW (ref 3.87–5.11)
RDW: 19.7 % — ABNORMAL HIGH (ref 11.5–15.5)
WBC: 9.7 10*3/uL (ref 4.0–10.5)
nRBC: 0.5 % — ABNORMAL HIGH (ref 0.0–0.2)

## 2023-03-23 LAB — BASIC METABOLIC PANEL
Anion gap: 17 — ABNORMAL HIGH (ref 5–15)
BUN: 44 mg/dL — ABNORMAL HIGH (ref 6–20)
CO2: 21 mmol/L — ABNORMAL LOW (ref 22–32)
Calcium: 9.7 mg/dL (ref 8.9–10.3)
Chloride: 95 mmol/L — ABNORMAL LOW (ref 98–111)
Creatinine, Ser: 8.78 mg/dL — ABNORMAL HIGH (ref 0.44–1.00)
GFR, Estimated: 5 mL/min — ABNORMAL LOW (ref >60)
Glucose, Bld: 101 mg/dL — ABNORMAL HIGH (ref 70–99)
Potassium: 5.1 mmol/L (ref 3.5–5.1)
Sodium: 133 mmol/L — ABNORMAL LOW (ref 135–145)

## 2023-03-23 LAB — GLUCOSE, CAPILLARY
Glucose-Capillary: 102 mg/dL — ABNORMAL HIGH (ref 70–99)
Glucose-Capillary: 115 mg/dL — ABNORMAL HIGH (ref 70–99)
Glucose-Capillary: 124 mg/dL — ABNORMAL HIGH (ref 70–99)
Glucose-Capillary: 93 mg/dL (ref 70–99)

## 2023-03-23 MED ORDER — VELTASSA 16.8 G PO PACK: 16.8000 g | PACK | ORAL | Status: DC

## 2023-03-23 MED ORDER — ANTICOAGULANT SODIUM CITRATE 4% (200MG/5ML) IV SOLN: 5.0000 mL | Status: DC | PRN

## 2023-03-23 MED ORDER — HEPARIN SODIUM (PORCINE) 1000 UNIT/ML DIALYSIS
1000.0000 [IU] | INTRAMUSCULAR | Status: DC | PRN
  Filled 2023-03-23 (×2): qty 1

## 2023-03-23 MED ORDER — HEPARIN SODIUM (PORCINE) 1000 UNIT/ML DIALYSIS
9000.0000 [IU] | Freq: Once | INTRAMUSCULAR | Status: AC
  Administered 2023-03-24: 9000 [IU] via INTRAVENOUS_CENTRAL
  Filled 2023-03-23: qty 9

## 2023-03-23 MED ORDER — OXYCODONE-ACETAMINOPHEN 7.5-325 MG PO TABS: 1.0000 | ORAL_TABLET | Freq: Four times a day (QID) | ORAL | 0 refills | Status: DC | PRN

## 2023-03-23 MED ORDER — ALTEPLASE 2 MG IJ SOLR: 2.0000 mg | Freq: Once | INTRAMUSCULAR | Status: DC | PRN

## 2023-03-23 MED ORDER — DIPHENHYDRAMINE HCL 50 MG/ML IJ SOLN
25.0000 mg | Freq: Once | INTRAMUSCULAR | Status: AC
  Administered 2023-03-24: 25 mg via INTRAVENOUS
  Filled 2023-03-23: qty 1

## 2023-03-23 MED ORDER — SUCROFERRIC OXYHYDROXIDE 500 MG PO CHEW
1000.0000 mg | CHEWABLE_TABLET | Freq: Three times a day (TID) | ORAL | Status: DC
  Administered 2023-03-23 – 2023-03-25 (×3): 1000 mg via ORAL
  Filled 2023-03-23 (×7): qty 2

## 2023-03-23 NOTE — TOC Progression Note (Signed)
Transition of Care [facility]) - Progression Note    Patient Details  Name: Courtney Gray MRN: [id number] Date of Birth: [birth year]  Transition [facility]) CM/SW Contact  Rosalie Buenaventura A [place], [place] Phone Number: 03/23/2023, 2:53 PM  Clinical Narrative:     CSW contacted Faythe Casa to confirm bed available at [facility] per previous TOC note. Janie at facility informed CSW that pt did not do a bed hold and no longer has a bed available and facility stated not sure when bed would become available.   CSW followed up with pt and pt's daughter, at bedside, who stated the would be agreeable to LTC SNF workup. Stated that she wasn't sure if they had LTC medicaid.   SNF workup completed. Bed offers pending.    TOC will continue to follow.   Expected Discharge Plan: Skilled Nursing Facility Barriers to Discharge: Continued Medical Work up  Expected Discharge Plan and Services                                               Social Determinants of Health (SDOH) Interventions SDOH Screenings   Food Insecurity: No Food Insecurity (03/16/2023)  Housing: Low Risk  (03/16/2023)  Transportation Needs: No Transportation Needs (03/16/2023)  Utilities: Not At Risk (03/16/2023)  Financial Resource Strain: High Risk (03/27/2018)   Received from [facility], [facility]  Social Connections: Unknown (08/20/2021)   Received from [facility], [facility]  Tobacco Use: Low Risk  (03/16/2023)  Health Literacy: Low Risk  (11/25/2022)   [facility] from [facility]    Readmission Risk Interventions    09/16/2021    4:36 PM 08/28/2021   11:50 AM 07/12/2021   12:38 PM  Readmission Risk Prevention Plan  Transportation Screening Complete Complete Complete  Medication Review (RN Care Manager)  Complete Complete  PCP or Specialist appointment within 3-5 days of discharge  Complete Complete  HRI or Home Care Consult Complete Complete Complete  SW Recovery Care/Counseling Consult  Complete Complete Complete  Palliative Care Screening  Not Applicable Not Applicable  Skilled Nursing Facility Complete Not Applicable Complete

## 2023-03-23 NOTE — Care Management Important Message (Signed)
Important Message  Patient Details  Name: Courtney Gray MRN: [id number] Date of Birth: 08-16-1970   Important Message Given:  Yes - Medicare IM     Dorena Bodo 03/23/2023, 3:23 PM

## 2023-03-23 NOTE — Progress Notes (Signed)
Pt. Was Schedule to come to Dialysis and pt. Refused per transport  and Retail banker. RN Dialysis aware and notified NP [place]

## 2023-03-23 NOTE — Progress Notes (Addendum)
Patient refused her dressing change. Daughter said she is gonna help her to do her dressing.

## 2023-03-23 NOTE — Plan of Care (Signed)

## 2023-03-23 NOTE — Plan of Care (Signed)
  Problem: Education: Goal: Knowledge of General Education information will improve Description: Including pain rating scale, medication(s)/side effects and non-pharmacologic comfort measures Outcome: Progressing   Problem: Clinical Measurements: Goal: Ability to maintain clinical measurements within normal limits will improve Outcome: Progressing   Problem: Health Behavior/Discharge Planning: Goal: Ability to manage health-related needs will improve Outcome: Progressing   

## 2023-03-23 NOTE — Progress Notes (Signed)
Insomnia: Patient is requesting for Benadryl to sleep.  She is stating that she did not have any good sleep since she has been admitted.  Ordered Benadryl 25 mg once.

## 2023-03-23 NOTE — Progress Notes (Signed)
PROGRESS NOTE Courtney Gray  UEA:[id number] DOB: 11-21-1970 DOA: 03/16/2023 PCP: Default, Provider, MD  Brief Narrative/Hospital Course: 52 year old with history of HTN, ESRD on HD MWF, NSTEMI, obesity, DM2 status post bilateral AKA, PAD, chronic hypoxia, hypothyroidism comes to the ED for fatigue and change in mental status started prior to the day of admission.  Chest x-ray showed possible pneumonia versus atelectasis.  CT of the head was negative.  Started on Rocephin and azithromycin.  Patient is also had frequent hypoglycemic episodes-symptomatic with encephalopathy. Patient is from the facility as LTC and can return once medically stable-TOC following.  Patient was seen by vascular surgery for discolored fingers and pain likely chronic ischemia, underwent arterial duplex of upper extremities Patient is having discoloration and pain of her fingers underwent upper extremity arterial duplex bilateral no obvious evidence of significant stenosis or occlusion.  Seen by vascular surgery, patient aware high risk for finger amputation.  Vascular surgery advised angiography with possible intervention but patient refusing at this time. Patient and daughter agreeable for return to [facility].  No more hypoglycemia after taking liberal carb diet.  Subjective: Patient seen and examined this morning alert awake oriented no more hypoglycemia.  They are agreeable for return to [facility] .  Refused to go for dialysis this morning as she was tired and wants to eat first Aware that she needs dialysis today  Assessment and Plan: Active Problems:   Hypoglycemia   Hyperkalemia   Idiopathic hypotension   Symptomatic Hypoglycemia with encephalopathy Symptomatic recurrent hypoglycemic episodes as low as 25.Apparently raising concern for adrenal insufficiency cosyntropin test 60 m cortisol 23- AI ruled out.Patient also has underlying infection, ESRD status polypharmacy and poor oral intake contributing to  hypoglycemia. Off steroids. Keep on liberal carb diet\ now no more hypoglycemia Recent Labs  Lab 03/16/23 2259 03/17/23 0039 03/22/23 1334 03/22/23 1644 03/23/23 0025 03/23/23 0555 03/23/23 1108  GLUCAP  --    < > 87 93 115* 93 102*  HGBA1C 5.1  --   --   --   --   --   --    < > = values in this interval not displayed.    Acute encephalopathy,metabolic toxic: Multifactorial with infection,hypoglycemia, multiple sedative medications/pain medicine. Mental status improved but has poor baseline functional status.  Off fentanyl patch and multiple psychotropic medication:Flexeril, gabapentin, oxycodone, Lyrica.Advised her that anytime there is a change in medication needs to be reviewed by her nephrology.  Tolerating Percocet for pain   Community-acquired pneumonia: CT scan showed pneumonia.  Completed antibiotics.  Continue I-S bronchodilators    Bilateral upper extremity darkening of fingertips Bilateral ischemic fingers: Right index fingertip dark with some ulceration.  Does not appear to be calcification. This is likely from blood flow compromise but appears to be chronic, vascular consulted arterial duplex- no acute finding-recommended for angiography and possible intervention and patient refused. Not typical for calciphylaxis continue wound care. Cont pain control, she is aware high risk for amputation    ESRD MWF Hyperkalemia: Metabolic bone disease History of calciphylaxis:  continue wound care-abdominal wound healing-patient resumed on sodium thiosulfate 3 times a week here.  Continue with HD plan per nephrology continue midodrine pre-HD. Cont home [place] For HD today.   Congestive heart failure with reduced EF, 35% TTE 8/24 shows EF 35% with global hypokinesia.  Volume status managed W/ hd   Anemia chronic kidney disease Hemoglobin stable.    Hypothyroidism:  TSH is stable continue home Synthroid.    Essential hypertension: BP  soft.Adrenal insufficiency ruled out.   Antihypertensives on hold.   PAD s/p B/l BKA cont spirin and statin   GERD Protonix daily.    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 (Active)  03/17/23 2000  Location: Thigh  Location Orientation: Posterior;Proximal;Right  Staging: Stage 4 - Full thickness tissue loss with exposed bone, tendon or muscle.  Wound Description (Comments):   Present on Admission: Yes  Dressing Type Foam - Lift dressing to assess site every shift 03/17/23 2000   DVT prophylaxis: heparin injection 5,000 Units Start: 03/16/23 2200 Code Status:   Code Status: Full Code Family Communication: plan of care discussed with patient/daughter at bedside. Patient status is: Remains hospitalized because of severity of illness Level of care: Med-Surg   Dispo:The patient is from:[facility]            Anticipated disposition: LTACH vs snf ( new) .  Working on [employer] with Mardene Sayer in place  Objective: Vitals last 24 hrs: Vitals:   03/22/23 1946 03/23/23 0022 03/23/23 0553 03/23/23 0820  BP: 115/70 (!) 115/101 106/75 123/77  Pulse: 71 84 73 77  Resp: 18 18 18 17   Temp: 98 F (36.7 C) 98 F (36.7 C) (!) 97.3 F (36.3 C) 97.6 F (36.4 C)  TempSrc:   Oral Oral  SpO2: 100% 100% 100% 100%  Weight:      Height:       Weight change:   Physical Examination: General exam: alert awake, obese. HEENT:Oral mucosa moist, Ear/Nose WNL grossly Respiratory system: Bilaterally clear BS,no use of accessory muscle Cardiovascular system: S1 & S2 +, No JVD. Gastrointestinal system: Abdomen soft,NT,ND, BS+ Nervous System: Alert, awake, moving  UE, LE amputations Extremities: LE edema neg,distal peripheral pulses palpable and warm.  Skin: No rashes,no icterus. MSK: Normal muscle bulk,tone, power   Medications reviewed:  Scheduled Meds:  aspirin EC  81 mg Oral Daily    atorvastatin  10 mg Oral Daily   famotidine  20 mg Oral Daily   feeding supplement (NEPRO CARB STEADY)  237 mL Oral BID BM   heparin  5,000 Units Subcutaneous Q8H   heparin  9,000 Units Dialysis Once in dialysis   levothyroxine  100 mcg Oral Daily   midodrine  10 mg Oral Q M,W,F-HD   multivitamin  1 tablet Oral QHS   pantoprazole  40 mg Oral QHS   saccharomyces boulardii  250 mg Oral BID   senna  2 tablet Oral Daily   sucroferric oxyhydroxide  1,000 mg Oral TID WC   Continuous Infusions:  anticoagulant sodium citrate     sodium thiosulfate 25 g in sodium chloride 0.9 % 200 mL Infusion for Calciphylaxis     sodium thiosulfate 25 g in sodium chloride 0.9 % 200 mL Infusion for Calciphylaxis     Diet Order             Diet regular Room service appropriate? Yes; Fluid consistency: Thin; Fluid restriction: 1200 mL Fluid  Diet effective now                  Intake/Output Summary (Last 24 hours) at 03/23/2023 1323 Last data filed at 03/23/2023 1106 Gross per 24 hour  Intake 880 ml  Output --  Net 880 ml   Net IO Since Admission: -2,236 mL [03/23/23 1323]  Wt Readings from Last 3 Encounters:  03/20/23 121.5 kg  12/02/22 117.9 kg  11/13/22 120.9 kg     Unresulted Labs (From admission, onward)     Start     Ordered   03/23/23 0701  Basic metabolic panel  Once,   R       Question:  Specimen collection method  Answer:  Lab=Lab collect   03/23/23 0701   03/23/23 0701  CBC with Differential/Platelet  Once,   R       Question:  Specimen collection method  Answer:  Lab=Lab collect   03/23/23 0701   03/18/23 0500  Basic metabolic panel  Daily,   R     Question:  Specimen collection method  Answer:  IV Team=IV Team collect   03/17/23 0929   03/16/23 2136  CBC  (heparin)  Once,   R       Comments: Baseline for heparin therapy IF NOT ALREADY DRAWN.  Notify MD if PLT < 100 K.    03/16/23 2138   Signed and Held  Renal function panel  Once,   R       Question:  Specimen collection  method  Answer:  Lab=Lab collect   Signed and Held   Signed and Held  CBC  Once,   R       Question:  Specimen collection method  Answer:  Lab=Lab collect   Signed and Held   Signed and Held  Renal function panel  Once,   R       Question:  Specimen collection method  Answer:  Lab=Lab collect   Signed and Held   Signed and Held  CBC  Once,   R       Question:  Specimen collection method  Answer:  Lab=Lab collect   Signed and Held          Data Reviewed: I have personally reviewed following labs and imaging studies CBC: Recent Labs  Lab 03/16/23 2259 03/17/23 1430 03/18/23 0745 03/19/23 1458 03/20/23 1047  WBC 12.4* 15.3* 12.6* 10.3 9.4  NEUTROABS 9.6*  --   --  6.9  --   HGB 10.1* 10.1* 10.5* 10.0* 11.4*  HCT 33.9* 33.1* 33.6* 33.0* 37.1  MCV 100.0 98.5 96.3 97.9 98.1  PLT 220 226 219 208 203   Basic Metabolic Panel:  Recent Labs  Lab 03/16/23 2259 03/17/23 1430 03/18/23 0745 03/19/23 1458 03/20/23 1047  NA 133* 133* 131* 132* 133*  K 5.3* 6.0* 4.8 4.8 4.4  CL 95* 96* 94* 94* 97*  CO2 20* 20* 21* 24 21*  GLUCOSE 132* 119* 112* 82 62*  BUN 63* 65* 40* 56* 33*  CREATININE 12.88* 14.09* 8.68* 10.82* 7.06*  CALCIUM 9.1 9.4 10.0 8.9 9.1  MG  --   --  2.2  --  2.2  PHOS  --   --  7.3*  --  6.0*   GFR: Estimated Creatinine Clearance: 8.1 mL/min (A) (by C-G formula based on SCr of 7.06 mg/dL (H)). Liver Function Tests:  Recent Labs  Lab 03/16/23 2259 03/17/23 1430 03/20/23 1047  AST 19 16  --   ALT 17 15  --   ALKPHOS 63 63  --   BILITOT 0.8 0.7  --   PROT 6.7 7.2  --   ALBUMIN 2.9* 3.4* 3.3*   No results for input(s): "LIPASE", "AMYLASE" in the last 168 hours.  Recent Labs  Lab 03/16/23 2259  AMMONIA 32   Coagulation Profile: No results for input(s): "INR", "PROTIME" in the last 168 hours. No  results for input(s): "PROBNP" in the last 168 hours.  No results for input(s): "HGBA1C" in the last 72 hours.  Recent Labs  Lab 03/22/23 1334 03/22/23 1644  03/23/23 0025 03/23/23 0555 03/23/23 1108  GLUCAP 87 93 115* 93 102*   No results for input(s): "CHOL", "HDL", "LDLCALC", "TRIG", "CHOLHDL", "LDLDIRECT" in the last 72 hours. No results for input(s): "TSH", "T4TOTAL", "FREET4", "T3FREE", "THYROIDAB" in the last 72 hours.  Sepsis Labs: Recent Labs  Lab 03/16/23 2259 03/17/23 0040 03/17/23 0153  PROCALCITON 0.39  --   --   LATICACIDVEN  --  1.2 1.2   Recent Results (from the past 240 hours)  Resp panel by RT-PCR (RSV, Flu A&B, Covid) Anterior Nasal Swab     Status: None   Collection Time: 03/16/23 10:23 AM   Specimen: Anterior Nasal Swab  Result Value Ref Range Status   SARS Coronavirus 2 by RT PCR NEGATIVE NEGATIVE Final   Influenza A by PCR NEGATIVE NEGATIVE Final   Influenza B by PCR NEGATIVE NEGATIVE Final    Comment: (NOTE) The Xpert Xpress SARS-CoV-2/FLU/RSV plus assay is intended as an aid in the diagnosis of influenza from Nasopharyngeal swab specimens and should not be used as a sole basis for treatment. Nasal washings and aspirates are unacceptable for Xpert Xpress SARS-CoV-2/FLU/RSV testing.  Fact Sheet for Patients: BloggerCourse.com  Fact Sheet for Healthcare Providers: SeriousBroker.it  This test is not yet approved or cleared by the [place] FDA and has been authorized for detection and/or diagnosis of SARS-CoV-2 by FDA under an Emergency Use Authorization (EUA). This EUA will remain in effect (meaning this test can be used) for the duration of the COVID-19 declaration under Section 564(b)(1) of the Act, 21 U.S.C. section 360bbb-3(b)(1), unless the authorization is terminated or revoked.     Resp Syncytial Virus by PCR NEGATIVE NEGATIVE Final    Comment: (NOTE) Fact Sheet for Patients: BloggerCourse.com  Fact Sheet for Healthcare Providers: SeriousBroker.it  This test is not yet approved or  cleared by the [place] FDA and has been authorized for detection and/or diagnosis of SARS-CoV-2 by FDA under an Emergency Use Authorization (EUA). This EUA will remain in effect (meaning this test can be used) for the duration of the COVID-19 declaration under Section 564(b)(1) of the Act, 21 U.S.C. section 360bbb-3(b)(1), unless the authorization is terminated or revoked.  Performed at [facility], [address] [place], [place] [address]   Culture, blood (Routine X 2) w Reflex to ID Panel     Status: None   Collection Time: 03/17/23  1:53 AM   Specimen: BLOOD LEFT HAND  Result Value Ref Range Status   Specimen Description BLOOD LEFT HAND  Final   Special Requests   Final    BOTTLES DRAWN AEROBIC AND ANAEROBIC Blood Culture results may not be optimal due to an inadequate volume of blood received in culture bottles   Culture   Final    NO GROWTH 5 DAYS Performed at [facility], [address], [place], [place] [address]    Report Status 03/22/2023 FINAL  Final  MRSA Next Gen by PCR, Nasal     Status: None   Collection Time: 03/17/23  9:25 PM   Specimen: Nasal Mucosa; Nasal Swab  Result Value Ref Range Status   MRSA by PCR Next Gen NOT DETECTED NOT DETECTED Final    Comment: (NOTE) The GeneXpert MRSA Assay (FDA approved for NASAL specimens only), is one component of a comprehensive  MRSA colonization surveillance program. It is not intended to diagnose MRSA infection nor to guide or monitor treatment for MRSA infections. Test performance is not FDA approved in patients less than 29 years old. Performed at [facility], [address], [place], [place] [address]     Antimicrobials/Microbiology: Anti-infectives (From admission, onward)    Start     Dose/Rate Route Frequency Ordered Stop   03/17/23 1000  azithromycin (ZITHROMAX) 500 mg in sodium chloride 0.9 % 250 mL IVPB  Status:  Discontinued        500 mg 250 mL/hr over 60 Minutes Intravenous  Every 24 hours 03/17/23 0201 03/17/23 0801   03/17/23 1000  cefTRIAXone (ROCEPHIN) 1 g in sodium chloride 0.9 % 100 mL IVPB        1 g 200 mL/hr over 30 Minutes Intravenous Every 24 hours 03/17/23 0201 03/21/23 1844   03/17/23 1000  azithromycin (ZITHROMAX) tablet 500 mg        500 mg Oral Daily 03/17/23 0801 03/21/23 1100   03/16/23 1115  cefTRIAXone (ROCEPHIN) 1 g in sodium chloride 0.9 % 100 mL IVPB        1 g 200 mL/hr over 30 Minutes Intravenous  Once 03/16/23 1102 03/16/23 1314   03/16/23 1115  azithromycin (ZITHROMAX) 500 mg in sodium chloride 0.9 % 250 mL IVPB        500 mg 250 mL/hr over 60 Minutes Intravenous  Once 03/16/23 1102 03/16/23 1433         Component Value Date/Time   SDES BLOOD LEFT HAND 03/17/2023 0153   SPECREQUEST  03/17/2023 0153    BOTTLES DRAWN AEROBIC AND ANAEROBIC Blood Culture results may not be optimal due to an inadequate volume of blood received in culture bottles   CULT  03/17/2023 0153    NO GROWTH 5 DAYS Performed at [facility], [address], [place], [place] [address]    REPTSTATUS 03/22/2023 FINAL 03/17/2023 0153     Radiology Studies: No results found.    LOS: 7 days   Total time spent in review of labs and imaging, patient evaluation, formulation of plan, documentation and communication with family: 25 minutes  Lanae Boast, MD [facility]  03/23/2023, 1:23 PM

## 2023-03-23 NOTE — Progress Notes (Signed)
[facility]  Subjective  - still unsure to proceed with PVL   Objective 106/75 73 (!) 97.3 F (36.3 C) (Oral) 18 100%  Intake/Output Summary (Last 24 hours) at 03/23/2023 0657 Last data filed at 03/23/2023 0036 Gross per 24 hour  Intake 480 ml  Output --  Net 480 ml    Left pointer finger ischemic wound Right pointer finger Falls asleep while sitting up right Lungs non labored breathing  Assessment/Planning: B hand ischemic finger changes  Pending discussion with Dr. Hetty Blend this am.   Patient refused angiography with possible intervention We will cont to follow   Courtney Gray 03/23/2023 6:57 AM --  Laboratory Lab Results: Recent Labs    03/20/23 1047  WBC 9.4  HGB 11.4*  HCT 37.1  PLT 203   BMET Recent Labs    03/20/23 1047  NA 133*  K 4.4  CL 97*  CO2 21*  GLUCOSE 62*  BUN 33*  CREATININE 7.06*  CALCIUM 9.1    COAG Lab Results  Component Value Date   INR 1.1 09/09/2022   INR 1.3 (H) 05/25/2022   INR 1.3 (H) 10/09/2021   No results found for: "PTT"

## 2023-03-23 NOTE — Progress Notes (Signed)
[facility] Progress Note   Subjective:  Seen in room - declined to come for HD today, wants to rest - will try to accommodate for tomorrow. Denies CP/dyspnea.  Objective Vitals:   03/22/23 1946 03/23/23 0022 03/23/23 0553 03/23/23 0820  BP: 115/70 (!) 115/101 106/75 123/77  Pulse: 71 84 73 77  Resp: 18 18 18 17   Temp: 98 F (36.7 C) 98 F (36.7 C) (!) 97.3 F (36.3 C) 97.6 F (36.4 C)  TempSrc:   Oral Oral  SpO2: 100% 100% 100% 100%  Weight:      Height:       Physical Exam General: Chronically ill appearing woman, NAD. Room air Heart: RRR; no murmur Lungs: CTAB; no rales Abdomen: obese, many bandages Extremities: B AKA, R 1st finger ischemic changes Dialysis Access:  TDC in R chest  Additional Objective Labs: Basic Metabolic Panel: Recent Labs  Lab 03/18/23 0745 03/19/23 1458 03/20/23 1047  NA 131* 132* 133*  K 4.8 4.8 4.4  CL 94* 94* 97*  CO2 21* 24 21*  GLUCOSE 112* 82 62*  BUN 40* 56* 33*  CREATININE 8.68* 10.82* 7.06*  CALCIUM 10.0 8.9 9.1  PHOS 7.3*  --  6.0*   Liver Function Tests: Recent Labs  Lab 03/16/23 2259 03/17/23 1430 03/20/23 1047  AST 19 16  --   ALT 17 15  --   ALKPHOS 63 63  --   BILITOT 0.8 0.7  --   PROT 6.7 7.2  --   ALBUMIN 2.9* 3.4* 3.3*   CBC: Recent Labs  Lab 03/16/23 2259 03/17/23 1430 03/18/23 0745 03/19/23 1458 03/20/23 1047  WBC 12.4* 15.3* 12.6* 10.3 9.4  NEUTROABS 9.6*  --   --  6.9  --   HGB 10.1* 10.1* 10.5* 10.0* 11.4*  HCT 33.9* 33.1* 33.6* 33.0* 37.1  MCV 100.0 98.5 96.3 97.9 98.1  PLT 220 226 219 208 203   Medications:  anticoagulant sodium citrate     sodium thiosulfate 25 g in sodium chloride 0.9 % 200 mL Infusion for Calciphylaxis     sodium thiosulfate 25 g in sodium chloride 0.9 % 200 mL Infusion for Calciphylaxis      aspirin EC  81 mg Oral Daily   atorvastatin  10 mg Oral Daily   famotidine  20 mg Oral Daily   feeding supplement (NEPRO CARB STEADY)  237 mL Oral BID BM    heparin  5,000 Units Subcutaneous Q8H   heparin  9,000 Units Dialysis Once in dialysis   levothyroxine  100 mcg Oral Daily   midodrine  10 mg Oral Q M,W,F-HD   multivitamin  1 tablet Oral QHS   pantoprazole  40 mg Oral QHS   saccharomyces boulardii  250 mg Oral BID   senna  2 tablet Oral Daily   sucroferric oxyhydroxide  500 mg Oral TID WC    Dialysis Orders: MWF - AF 4hr, 400/A1.5, EDW 119.8kg, 2K/2Ca, UFP #2, TDC, heparin 9000 units q HD - Hectoral IV q HD -> needs to be stopped - Hx calciphylaxis - Mircera IV q 2 weeks (last given on 11/24) - Venofer 50mg  IV q week - Na Thio 25g q HD   Assessment/Plan: Somnolence: Not uremic, felt due to medication combination Lyrica + gabapentin -> both on hold for now. Resolved with medication adjustment + HD. Hypoglycemia: Diet adjustments per primary, not getting insulin. ESRD: Usual MWF, off schedule here, last HD 12/14. Plan was HD today, but refused ->  trying again tomorrow. BP/volume: On midodrine pre-HD, no dyspnea/edema. Anemia of ESRD: Hgb 11.4 now, holding ESA. Secondary HPTH: CorrCa + Phos high, VDRA held (should not be restarted as outpatient). 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 Continue Velphoro as binder, ^ dose.  Abd pannus calciphylaxis: Continue Na Thio 25g q HD and continue wound care Ischemic finger(s) R hand: Work up per VVS, known PAD. She is agreeable for distal amputation. Disposition: wants to seek different SNF placement. MSW involved.   Ozzie Hoyle, PA-C 03/23/2023, 1:08 PM  [employer]

## 2023-03-24 ENCOUNTER — Ambulatory Visit (HOSPITAL_BASED_OUTPATIENT_CLINIC_OR_DEPARTMENT_OTHER): Payer: 59 | Admitting: General Surgery

## 2023-03-24 DIAGNOSIS — E162 Hypoglycemia, unspecified: Secondary | ICD-10-CM | POA: Diagnosis not present

## 2023-03-24 LAB — RENAL FUNCTION PANEL
Albumin: 3.4 g/dL — ABNORMAL LOW (ref 3.5–5.0)
Anion gap: 16 — ABNORMAL HIGH (ref 5–15)
BUN: 54 mg/dL — ABNORMAL HIGH (ref 6–20)
CO2: 21 mmol/L — ABNORMAL LOW (ref 22–32)
Calcium: 9.4 mg/dL (ref 8.9–10.3)
Chloride: 93 mmol/L — ABNORMAL LOW (ref 98–111)
Creatinine, Ser: 9.41 mg/dL — ABNORMAL HIGH (ref 0.44–1.00)
GFR, Estimated: 5 mL/min — ABNORMAL LOW (ref >60)
Glucose, Bld: 86 mg/dL (ref 70–99)
Phosphorus: 7.5 mg/dL — ABNORMAL HIGH (ref 2.5–4.6)
Potassium: 5.4 mmol/L — ABNORMAL HIGH (ref 3.5–5.1)
Sodium: 130 mmol/L — ABNORMAL LOW (ref 135–145)

## 2023-03-24 LAB — CBC
HCT: 35.6 % — ABNORMAL LOW (ref 36.0–46.0)
Hemoglobin: 10.9 g/dL — ABNORMAL LOW (ref 12.0–15.0)
MCH: 30.9 pg (ref 26.0–34.0)
MCHC: 30.6 g/dL (ref 30.0–36.0)
MCV: 100.8 fL — ABNORMAL HIGH (ref 80.0–100.0)
Platelets: 154 10*3/uL (ref 150–400)
RBC: 3.53 MIL/uL — ABNORMAL LOW (ref 3.87–5.11)
RDW: 19.9 % — ABNORMAL HIGH (ref 11.5–15.5)
WBC: 8.9 10*3/uL (ref 4.0–10.5)
nRBC: 0.6 % — ABNORMAL HIGH (ref 0.0–0.2)

## 2023-03-24 LAB — GLUCOSE, CAPILLARY
Glucose-Capillary: 95 mg/dL (ref 70–99)
Glucose-Capillary: 78 mg/dL (ref 70–99)
Glucose-Capillary: 61 mg/dL — ABNORMAL LOW (ref 70–99)

## 2023-03-24 MED ORDER — HEPARIN SODIUM (PORCINE) 1000 UNIT/ML IJ SOLN
4200.0000 [IU] | Freq: Once | INTRAMUSCULAR | Status: AC
  Administered 2023-03-24: 4200 [IU] via INTRAVENOUS
  Filled 2023-03-24: qty 4.2

## 2023-03-24 MED ORDER — DIPHENHYDRAMINE HCL 25 MG PO CAPS: 25.0000 mg | ORAL_CAPSULE | Freq: Four times a day (QID) | ORAL | Status: DC | PRN

## 2023-03-24 NOTE — Consult Note (Addendum)
WOC Nurse Consult Note: Reason for Consult: Requested to assess a fold persistence itching, even treating with antifungal. Wound type: Irritate folds. Performed remotely.  Obs: the Pt was follow by [facility] team 12/11 to assess abdominal wounds, right ischium and fingers.  Dressing procedure/placement/frequency: Apply InterDry dressing during 5 days on folds that it is itching and irritate.  Order [facility] [place] # [phone] Measure and cut length of InterDry to fit in skin folds that have skin breakdown Tuck InterDry fabric into skin folds in a single layer, allow for 2 inches of overhang from skin edges to allow for wicking to occur May remove to bathe; dry area thoroughly and then tuck into affected areas again Do not apply any creams or ointments when using InterDry DO NOT THROW AWAY FOR 5 DAYS unless soiled with stool DO NOT [facility] product, this will inactivate the silver in the material  New sheet of Interdry should be applied after 5 days of use if patient continues to have skin breakdown    WOC team will not plan to follow further.  Please reconsult if further assistance is needed. Thank-you,  Denyse Amass BSN, RN, [employer], WOC  (Pager: [phone])

## 2023-03-24 NOTE — TOC Progression Note (Addendum)
Transition of Care [facility]) - Progression Note    Patient Details  Name: Courtney Gray MRN: [id number] Date of Birth: [birth year]  Transition of [facility] CM/SW Contact  Milica Gully A [place], [place] Phone Number: 03/24/2023, 11:23 AM  Clinical Narrative:     Update 1215  Quandra reached out to CSW that she has reviewed pt and is declining bed at this time. CSW will continue to assist with bed placement for pt.   No current bed offers for pt. CSW reached out to pending offer facility, [facility] and left VM with contact information to reach back out to CSW. Bed offers still pending.   TOC will continue to follow.   Expected Discharge Plan: Skilled Nursing Facility Barriers to Discharge: Continued Medical Work up  Expected Discharge Plan and Services                                               Social Determinants of Health (SDOH) Interventions SDOH Screenings   Food Insecurity: No Food Insecurity (03/16/2023)  Housing: Low Risk  (03/16/2023)  Transportation Needs: No Transportation Needs (03/16/2023)  Utilities: Not At Risk (03/16/2023)  Financial Resource Strain: High Risk (03/27/2018)   Received from [facility], [facility]  Social Connections: Unknown (08/20/2021)   Received from [facility], [facility]  Tobacco Use: Low Risk  (03/16/2023)  Health Literacy: Low Risk  (11/25/2022)   Received from [facility]    Readmission Risk Interventions    09/16/2021    4:36 PM 08/28/2021   11:50 AM 07/12/2021   12:38 PM  Readmission Risk Prevention Plan  Transportation Screening Complete Complete Complete  Medication Review (RN Care Manager)  Complete Complete  PCP or Specialist appointment within 3-5 days of discharge  Complete Complete  HRI or Home Care Consult Complete Complete Complete  SW Recovery Care/Counseling Consult Complete Complete Complete  Palliative Care Screening  Not Applicable Not Applicable  Skilled Nursing Facility Complete Not Applicable  Complete

## 2023-03-24 NOTE — Progress Notes (Signed)
[facility] Progress Note   Subjective:   Seen in room - slept ok overnight, agreeable for HD today. No CP/dyspnea.  Objective Vitals:   03/23/23 2031 03/24/23 0445 03/24/23 0500 03/24/23 0819  BP: 130/77 (!) 105/32  (!) 133/107  Pulse: 75 77  82  Resp: 17     Temp: 97.7 F (36.5 C)     TempSrc: Oral     SpO2: 100% 99%  100%  Weight:   122.2 kg   Height:       Physical Exam General: Chronically ill appearing woman, NAD. Room air Heart: RRR; no murmur Lungs: CTAB; no rales Abdomen: obese, many bandages around abdomen Extremities: B AKA, R 1st finger ischemic changes Dialysis Access:  [facility] in R chest  Additional Objective Labs: Basic Metabolic Panel: Recent Labs  Lab 03/18/23 0745 03/19/23 1458 03/20/23 1047 03/23/23 1847  NA 131* 132* 133* 133*  K 4.8 4.8 4.4 5.1  CL 94* 94* 97* 95*  CO2 21* 24 21* 21*  GLUCOSE 112* 82 62* 101*  BUN 40* 56* 33* 44*  CREATININE 8.68* 10.82* 7.06* 8.78*  CALCIUM 10.0 8.9 9.1 9.7  PHOS 7.3*  --  6.0*  --    Liver Function Tests: Recent Labs  Lab 03/17/23 1430 03/20/23 1047  AST 16  --   ALT 15  --   ALKPHOS 63  --   BILITOT 0.7  --   PROT 7.2  --   ALBUMIN 3.4* 3.3*   CBC: Recent Labs  Lab 03/17/23 1430 03/18/23 0745 03/19/23 1458 03/20/23 1047 03/23/23 1847  WBC 15.3* 12.6* 10.3 9.4 9.7  NEUTROABS  --   --  6.9  --  6.6  HGB 10.1* 10.5* 10.0* 11.4* 10.6*  HCT 33.1* 33.6* 33.0* 37.1 34.7*  MCV 98.5 96.3 97.9 98.1 99.7  PLT 226 219 208 203 172   Medications:  anticoagulant sodium citrate     sodium thiosulfate 25 g in sodium chloride 0.9 % 200 mL Infusion for Calciphylaxis     sodium thiosulfate 25 g in sodium chloride 0.9 % 200 mL Infusion for Calciphylaxis      aspirin EC  81 mg Oral Daily   atorvastatin  10 mg Oral Daily   famotidine  20 mg Oral Daily   feeding supplement (NEPRO CARB STEADY)  237 mL Oral BID BM   heparin  5,000 Units Subcutaneous Q8H   heparin  9,000 Units Dialysis Once  in dialysis   levothyroxine  100 mcg Oral Daily   midodrine  10 mg Oral Q M,W,F-HD   multivitamin  1 tablet Oral QHS   pantoprazole  40 mg Oral QHS   saccharomyces boulardii  250 mg Oral BID   senna  2 tablet Oral Daily   sucroferric oxyhydroxide  1,000 mg Oral TID WC    Dialysis Orders: MWF - AF 4hr, 400/A1.5, EDW 119.8kg, 2K/2Ca, UFP #2, TDC, heparin 9000 units q HD - Hectoral IV q HD -> needs to be stopped - Hx calciphylaxis - Mircera IV q 2 weeks (last given on 11/24) - Venofer 50mg  IV q week - Na Thio 25g q HD     Assessment/Plan: Somnolence: Not uremic, felt due to medication combination Lyrica + gabapentin -> both on hold for now. Resolved with medication adjustment + HD. Hypoglycemia: Diet adjustments per primary, not getting insulin. ESRD: Usual MWF, off schedule here, last HD 12/14. Agreeable for HD today, then will need to get back on usual MWF  schedule. BP/volume: On midodrine pre-HD, no dyspnea/edema. Anemia of ESRD: Hgb 10.6 - possibly discharging today, will resume ESA as outpatient. Secondary HPTH: CorrCa + Phos high, VDRA held (should not be restarted as outpatient). 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 Continue Velphoro as binder, ^ dose.  Abd pannus calciphylaxis: Continue Na Thio 25g q HD and continue wound care Ischemic finger(s) R hand: Work up per [facility]VS, known PAD. Told me agreeable for amputation, but has been refusing angiography per [facility] note. Disposition: wants to seek different SNF placement. MSW involved.   Ozzie Hoyle, PA-C 03/24/2023, 9:54 AM  [employer]

## 2023-03-24 NOTE — Progress Notes (Signed)
Received patient in bed.Awake,alert and oriented x 4. Consent verified.  Access used : Left hd catheter that worked well.Dressing on date.  Duration of treatment: 3 hours.  Net uf: Achieved 3 liters.  Medicine given : Heparin 9,000 units pre -run dose.  Tolerated treatment: Yes.  Hand off to the patient's nurse:Back into her room with stable medical condition via transporter.

## 2023-03-24 NOTE — Progress Notes (Addendum)
[facility] [place] [place]  Subjective  - didn't sleep well   Objective (!) 105/32 77 97.7 F (36.5 C) (Oral) 17 99%  Intake/Output Summary (Last 24 hours) at 03/24/2023 0716 Last data filed at 03/23/2023 2200 Gross per 24 hour  Intake 880 ml  Output --  Net 880 ml   Left pointer finger ischemic wound Right pointer finger Alert and oriented this am    Assessment/Planning: B hands with ischemic wounds, dry She is refusing angiogram and wants to think about her options.  We discussed that if she develops frank infection she may need angiogram to improve the inflow and she may require finger amputation in the future.   Dr. Hetty Blend will f/u with her today.    Courtney Gray 03/24/2023 7:16 AM --  Laboratory Lab Results: Recent Labs    03/23/23 1847  WBC 9.7  HGB 10.6*  HCT 34.7*  PLT 172   BMET Recent Labs    03/23/23 1847  NA 133*  K 5.1  CL 95*  CO2 21*  GLUCOSE 101*  BUN 44*  CREATININE 8.78*  CALCIUM 9.7    COAG Lab Results  Component Value Date   INR 1.1 09/09/2022   INR 1.3 (H) 05/25/2022   INR 1.3 (H) 10/09/2021   No results found for: "PTT"   VASCULAR STAFF ADDENDUM: I have independently interviewed and examined the patient. I agree with the above.  Not interested in vascular intervention at this time.  Please call back vascular surgery if she would like to reconsider or if there are further questions/concerns   Daria Pastures MD [facility] Phone Number: [phone] 03/24/2023 10:39 AM

## 2023-03-24 NOTE — Progress Notes (Deleted)
Cardiology Office Note    Date:  03/24/2023  ID:  Courtney Gray, DOB 1970-10-29, MRN [id number] PCP:  Default, Provider, MD  Cardiologist:  Charlton Haws, MD  Electrophysiologist:  None   Chief Complaint: f/u CHF, MR  History of Present Illness: .    Courtney Gray is a 52 y.o. 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   She has an extremely complex medical history as above. From cardiac standpoint was first seen by our team with chest pain 04/2021 then NSTEMI in 05/2021. Cath was deferred due to GI bleeding with recommendation to revisit as outpatient. She was not seen again by our team until our reader B performed TEE 09/2021 for complex bacteremia admission showing RA mass. Per TEE report, "Etiology of the right atrial mass remains unclear. Possible calcified thrombus. If she improves and can tolerate cardiac MRI this could be performed later to more fully assess. " Our team then saw her again during 7-11/2022 admission for weakness. EF was down by echo with possible LA density and troponin was elevated. TEE was performed showing  EF 30 to 35%, normal RV function, severe mitral regurgitation, no left atrial mass but severe right atrial calcification with extensive extension into encompassing a large portion of right atrial cavity. There was initial plan for [facility] but RHC was felt contraindicated in setting of RA findings. It was recommended to defer LHC unless she were having chest pain  and the patient did not want this done. She was not felt to be a candidate for intervention for her mitral valve; Dr. Bjorn Pippin commented poor prognosis. BP limited GDMT; required midodrine.  Hospitalized 03/16/23 with hypoglycemia, change MS CT head was negative  cortisol stim test negative Multiple psychotropic meds adjusted and fentanyl patch d/c CT scan with pneumonia Rx antibiotics had ulcerations in fingers both hands Seen by [facility] Duplex negative for proximal vascular dx Patient refused angiography  High risk for finger amputation has calcified thrombus along her chronic dialysis catheter   ***  Labwork independently reviewed: 7-11/2022 K 4.5, Cr 134, Cr 5.23, Hgb 10, plt 124, MG 1.8, TSH OK, AST ALT OK, albumin 3.7 08/2021 trig 114, LDL 23   ROS: .    Please see the history of present illness.  All other systems are reviewed and otherwise negative.  Studies Reviewed: Marland Kitchen    EKG:  EKG is not ordered today but reviewed from 11/05/22 - some irregularity but suspect NSR 87bpm with occasional PACs, nonspecific STTW changes.MD also felt this represented NSR.  CV Studies: Cardiac studies reviewed are outlined and summarized above. Otherwise please see EMR for full report.   Physical Exam:    VS:  There were no vitals taken for this visit.   Wt Readings from Last 3 Encounters:  03/24/23 269 lb 6.4 oz (122.2 kg)  12/02/22 260 lb (117.9 kg)  11/13/22 266 lb 8.6 oz (120.9 kg)    Chronically ill female Lungs clear SEM Left subclavian dialysis catheter  Post bilateral BKA's Right posterior thigh pressure ulcer  ***    Asessement and Plan:.  1. Chronic HFrEF and history of NSTEMI/elevated troponins - appears clinically stable. As above, no plan for invasive ischemic assessment at this time, recommended for medical therapy which has been limited by hypotension requiring midodrine.  TEE done 11/11/22 with EF 30-35% moderate MAC , severe MR , moderate TR and known calcinosis/thrombus along  the dialysis catheter  She also has stated she would not want LHC. Would not add any new HF medicines at this time. Given infection issues and ERSD on HD, do not suspect she is an ICD candidate due to infection risk. Volume status is managed by HD. I will defer lyte management to nephrology.  2. RA calcification - avoiding RHC in the setting of this. No further testing needed at this time. F/U nephrology consider changing left subclavian dialysis catheter as mass likely represents calcinosis/chronic thrombus   3. Severe MR - per hospital notes, she was not felt to be a candidate for surgical intervention, so medical approach recommended. Continue volume management by HD.  4. Hyperlipidemia - lipids have been treated and followed by PCP. Note she is on atorvastatin and also recently started on colchicine. As primary care manages/prescribes her atorvastatin I will defer management to them. I asked patient to remind PCP of her colchicine/atorvastatin rx given the drug interaction between the two, in case the dose needs to be adjusted on either.     Disposition: F/u 6 months   Signed, Charlton Haws, MD

## 2023-03-24 NOTE — Progress Notes (Signed)
PROGRESS NOTE Courtney Gray  ZOX:[id number] DOB: 1971-02-14 DOA: 03/16/2023 PCP: Default, Provider, MD  Brief Narrative/Hospital Course: 52 year old with history of HTN, ESRD on HD MWF, NSTEMI, obesity, DM2 status post bilateral AKA, PAD, chronic hypoxia, hypothyroidism comes to the ED for fatigue and change in mental status started prior to the day of admission.  Chest x-ray showed possible pneumonia versus atelectasis.  CT of the head was negative.  Started on Rocephin and azithromycin.  Patient is also had frequent hypoglycemic episodes-symptomatic with encephalopathy. Patient is from the facility as LTC and can return once medically stable-TOC following.  Patient was seen by vascular surgery for discolored fingers and pain likely chronic ischemia, underwent arterial duplex of upper extremities Patient is having discoloration and pain of her fingers underwent upper extremity arterial duplex bilateral no obvious evidence of significant stenosis or occlusion.  Seen by vascular surgery, patient aware high risk for finger amputation.   03/23/23>Vascular surgery advised angiography with possible intervention but patient refusing at this time-awaiting final plan from vascular surgery. Patient and daughter agreeable for return to [facility] working on it. No more hypoglycemia after taking liberal carb diet.  Subjective: Patient seen and examined this morning  Requesting Benadryl, requesting for IV line removal as it is irritating her,  Waiting for dialysis today, waiting for SNF bed  Daughter at the bedside   Assessment and Plan: Active Problems:   Hypoglycemia   Hyperkalemia   Idiopathic hypotension   Symptomatic Hypoglycemia with encephalopathy Symptomatic recurrent hypoglycemic episodes as low as 25.Apparently raising concern for adrenal insufficiency cosyntropin test 60 m cortisol 23- AI ruled out.Patient also has underlying infection, ESRD status polypharmacy and poor oral intake  contributing to hypoglycemia. Off steroids.  Blood sugar remains stable no hypoglycemia continue on liberal carbohydrate diet  Recent Labs  Lab 03/23/23 0025 03/23/23 0555 03/23/23 1108 03/23/23 2027 03/24/23 0558  GLUCAP 115* 93 102* 124* 95    Acute encephalopathy,metabolic toxic: Multifactorial with infection,hypoglycemia, multiple sedative medications/pain medicine. Mental status improved but has poor baseline functional status.  Off fentanyl patch and multiple psychotropic medication:Flexeril, gabapentin, oxycodone, Lyrica.Advised her that anytime there is a change in medication needs to be reviewed by her nephrology.  Tolerating Percocet for pain, requesting Benadryl for sleep and itching-Continue with caution   Community-acquired pneumonia: CT scan showed pneumonia.  Completed antibiotics.  Continue I-S bronchodilators    Bilateral upper extremity darkening of fingertips Bilateral ischemic fingers: Right index fingertip dark with some ulceration.  Does not appear to be calcification. This is likely from blood flow compromise but appears to be chronic, vascular consulted arterial duplex- no acute finding-recommended for angiography and possible intervention and patient refused-vascular surgery following and will need very close outpatient follow-up and monitoring of her extremities.Not typical for calciphylaxis continue wound care. Cont pain control, she is aware high risk for amputation    ESRD MWF Hyperkalemia: Metabolic bone disease History of calciphylaxis:  continue wound care-abdominal wound healing-patient resumed on sodium thiosulfate 3 times a week here.  Continue with HD plan per nephrology-refused PRN 12/16, for HD today.  Continue midodrine pre-HD cont home [place] three times/wk  on d/c   Congestive heart failure with reduced EF, 35% TTE 8/24 shows EF 35% with global hypokinesia.  Volume status managed W/ hd   Anemia chronic kidney disease Hemoglobin stable.     Hypothyroidism:  TSH is stable continue home Synthroid.    Essential hypertension: BP soft.Adrenal insufficiency ruled out.  Antihypertensives on hold.   PAD  s/p B/l BKA cont spirin and statin   GERD Protonix daily.   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 (Active)  03/17/23 2000  Location: Thigh  Location Orientation: Posterior;Proximal;Right  Staging: Stage 4 - Full thickness tissue loss with exposed bone, tendon or muscle.  Wound Description (Comments):   Present on Admission: Yes  Dressing Type Foam - Lift dressing to assess site every shift 03/17/23 2000   DVT prophylaxis: heparin injection 5,000 Units Start: 03/16/23 2200 Code Status:   Code Status: Full Code Family Communication: plan of care discussed with patient/daughter at bedside. Patient status is: Remains hospitalized because of severity of illness Level of care: Med-Surg   Dispo:The patient is from:[facility]            Anticipated disposition: Awaiting for skilled nursing facility, question if [facility] will be able to take her back   Objective: Vitals last 24 hrs: Vitals:   03/23/23 2031 03/24/23 0445 03/24/23 0500 03/24/23 0819  BP: 130/77 (!) 105/32  (!) 133/107  Pulse: 75 77  82  Resp: 17     Temp: 97.7 F (36.5 C)     TempSrc: Oral     SpO2: 100% 99%  100%  Weight:   122.2 kg   Height:       Weight change:   Physical Examination: General exam: alert awake, oriented  HEENT:Oral mucosa moist, Ear/Nose WNL grossly Respiratory system: Bilaterally clear BS,no use of accessory muscle Cardiovascular system: S1 & S2 +, No JVD. Gastrointestinal system: Abdomen soft,NT,ND, BS+ Nervous System: Alert, awake, moving all extremities,and following commands. Extremities: LE edema neg, leg amputee Skin: No rashes,no icterus. MSK: Normal muscle  bulk,tone, power   Medications reviewed:  Scheduled Meds:  aspirin EC  81 mg Oral Daily   atorvastatin  10 mg Oral Daily   famotidine  20 mg Oral Daily   feeding supplement (NEPRO CARB STEADY)  237 mL Oral BID BM   heparin  5,000 Units Subcutaneous Q8H   heparin  9,000 Units Dialysis Once in dialysis   levothyroxine  100 mcg Oral Daily   midodrine  10 mg Oral Q M,W,F-HD   multivitamin  1 tablet Oral QHS   pantoprazole  40 mg Oral QHS   saccharomyces boulardii  250 mg Oral BID   senna  2 tablet Oral Daily   sucroferric oxyhydroxide  1,000 mg Oral TID WC   Continuous Infusions:  anticoagulant sodium citrate     sodium thiosulfate 25 g in sodium chloride 0.9 % 200 mL Infusion for Calciphylaxis     sodium thiosulfate 25 g in sodium chloride 0.9 % 200 mL Infusion for Calciphylaxis     Diet Order             Diet regular Room service appropriate? Yes; Fluid consistency: Thin; Fluid restriction: 1200 mL Fluid  Diet effective now                  Intake/Output Summary (Last 24 hours) at 03/24/2023 1124 Last data filed at 03/23/2023 2200 Gross per 24 hour  Intake 480 ml  Output --  Net 480 ml   Net IO Since Admission: -1,756 mL [03/24/23 1124]  Wt Readings from Last 3 Encounters:  03/24/23 122.2 kg  12/02/22 117.9 kg  11/13/22 120.9 kg     Unresulted Labs (From admission, onward)  Start     Ordered   03/18/23 0500  Basic metabolic panel  Daily,   R     Question:  Specimen collection method  Answer:  IV Team=IV Team collect   03/17/23 0929   03/16/23 2136  CBC  (heparin)  Once,   R       Comments: Baseline for heparin therapy IF NOT ALREADY DRAWN.  Notify MD if PLT < 100 K.    03/16/23 2138   Signed and Held  Renal function panel  Once,   R       Question:  Specimen collection method  Answer:  Lab=Lab collect   Signed and Held   Signed and Held  CBC  Once,   R       Question:  Specimen collection method  Answer:  Lab=Lab collect   Signed and Held   Signed and  Held  Renal function panel  Once,   R       Question:  Specimen collection method  Answer:  Lab=Lab collect   Signed and Held   Signed and Held  CBC  Once,   R       Question:  Specimen collection method  Answer:  Lab=Lab collect   Signed and Held          Data Reviewed: I have personally reviewed following labs and imaging studies CBC: Recent Labs  Lab 03/17/23 1430 03/18/23 0745 03/19/23 1458 03/20/23 1047 03/23/23 1847  WBC 15.3* 12.6* 10.3 9.4 9.7  NEUTROABS  --   --  6.9  --  6.6  HGB 10.1* 10.5* 10.0* 11.4* 10.6*  HCT 33.1* 33.6* 33.0* 37.1 34.7*  MCV 98.5 96.3 97.9 98.1 99.7  PLT 226 219 208 203 172   Basic Metabolic Panel:  Recent Labs  Lab 03/17/23 1430 03/18/23 0745 03/19/23 1458 03/20/23 1047 03/23/23 1847  NA 133* 131* 132* 133* 133*  K 6.0* 4.8 4.8 4.4 5.1  CL 96* 94* 94* 97* 95*  CO2 20* 21* 24 21* 21*  GLUCOSE 119* 112* 82 62* 101*  BUN 65* 40* 56* 33* 44*  CREATININE 14.09* 8.68* 10.82* 7.06* 8.78*  CALCIUM 9.4 10.0 8.9 9.1 9.7  MG  --  2.2  --  2.2  --   PHOS  --  7.3*  --  6.0*  --    GFR: Estimated Creatinine Clearance: 6.6 mL/min (A) (by C-G formula based on SCr of 8.78 mg/dL (H)). Liver Function Tests:  Recent Labs  Lab 03/17/23 1430 03/20/23 1047  AST 16  --   ALT 15  --   ALKPHOS 63  --   BILITOT 0.7  --   PROT 7.2  --   ALBUMIN 3.4* 3.3*   Recent Results (from the past 240 hours)  Resp panel by RT-PCR (RSV, Flu A&B, Covid) Anterior Nasal Swab     Status: None   Collection Time: 03/16/23 10:23 AM   Specimen: Anterior Nasal Swab  Result Value Ref Range Status   SARS Coronavirus 2 by RT PCR NEGATIVE NEGATIVE Final   Influenza A by PCR NEGATIVE NEGATIVE Final   Influenza B by PCR NEGATIVE NEGATIVE Final    Comment: (NOTE) The Xpert Xpress SARS-CoV-2/FLU/RSV plus assay is intended as an aid in the diagnosis of influenza from Nasopharyngeal swab specimens and should not be used as a sole basis for treatment. Nasal washings  and aspirates are unacceptable for Xpert Xpress SARS-CoV-2/FLU/RSV testing.  Fact Sheet for Patients: BloggerCourse.com  Fact Sheet for  Healthcare Providers: SeriousBroker.it  This test is not yet approved or cleared by the [place] and has been authorized for detection and/or diagnosis of SARS-CoV-2 by FDA under an Emergency Use Authorization (EUA). This EUA will remain in effect (meaning this test can be used) for the duration of the COVID-19 declaration under Section 564(b)(1) of the Act, 21 U.S.C. section 360bbb-3(b)(1), unless the authorization is terminated or revoked.     Resp Syncytial Virus by PCR NEGATIVE NEGATIVE Final    Comment: (NOTE) Fact Sheet for Patients: BloggerCourse.com  Fact Sheet for Healthcare Providers: SeriousBroker.it  This test is not yet approved or cleared by the [place] FDA and has been authorized for detection and/or diagnosis of SARS-CoV-2 by FDA under an Emergency Use Authorization (EUA). This EUA will remain in effect (meaning this test can be used) for the duration of the COVID-19 declaration under Section 564(b)(1) of the Act, 21 U.S.C. section 360bbb-3(b)(1), unless the authorization is terminated or revoked.  Performed at [facility], [address] [place], [place] [address]   Culture, blood (Routine X 2) w Reflex to ID Panel     Status: None   Collection Time: 03/17/23  1:53 AM   Specimen: BLOOD LEFT HAND  Result Value Ref Range Status   Specimen Description BLOOD LEFT HAND  Final   Special Requests   Final    BOTTLES DRAWN AEROBIC AND ANAEROBIC Blood Culture results may not be optimal due to an inadequate volume of blood received in culture bottles   Culture   Final    NO GROWTH 5 DAYS Performed at [facility], [address], [place], [place] [address]    Report Status 03/22/2023 FINAL  Final  MRSA  Next Gen by PCR, Nasal     Status: None   Collection Time: 03/17/23  9:25 PM   Specimen: Nasal Mucosa; Nasal Swab  Result Value Ref Range Status   MRSA by PCR Next Gen NOT DETECTED NOT DETECTED Final    Comment: (NOTE) The GeneXpert MRSA Assay (FDA approved for NASAL specimens only), is one component of a comprehensive MRSA colonization surveillance program. It is not intended to diagnose MRSA infection nor to guide or monitor treatment for MRSA infections. Test performance is not FDA approved in patients less than 101 years old. Performed at [facility], [address], [place], [place] [address]     Antimicrobials/Microbiology: Anti-infectives (From admission, onward)    Start     Dose/Rate Route Frequency Ordered Stop   03/17/23 1000  azithromycin (ZITHROMAX) 500 mg in sodium chloride 0.9 % 250 mL IVPB  Status:  Discontinued        500 mg 250 mL/hr over 60 Minutes Intravenous Every 24 hours 03/17/23 0201 03/17/23 0801   03/17/23 1000  cefTRIAXone (ROCEPHIN) 1 g in sodium chloride 0.9 % 100 mL IVPB        1 g 200 mL/hr over 30 Minutes Intravenous Every 24 hours 03/17/23 0201 03/21/23 1844   03/17/23 1000  azithromycin (ZITHROMAX) tablet 500 mg        500 mg Oral Daily 03/17/23 0801 03/21/23 1100   03/16/23 1115  cefTRIAXone (ROCEPHIN) 1 g in sodium chloride 0.9 % 100 mL IVPB        1 g 200 mL/hr over 30 Minutes Intravenous  Once 03/16/23 1102 03/16/23 1314   03/16/23 1115  azithromycin (ZITHROMAX) 500 mg in sodium chloride 0.9 % 250 mL IVPB  500 mg 250 mL/hr over 60 Minutes Intravenous  Once 03/16/23 1102 03/16/23 1433         Component Value Date/Time   SDES BLOOD LEFT HAND 03/17/2023 0153   SPECREQUEST  03/17/2023 0153    BOTTLES DRAWN AEROBIC AND ANAEROBIC Blood Culture results may not be optimal due to an inadequate volume of blood received in culture bottles   CULT  03/17/2023 0153    NO GROWTH 5 DAYS Performed at [facility], [address]  [place], [place] [address]    REPTSTATUS 03/22/2023 FINAL 03/17/2023 0153    Radiology Studies: No results found.   LOS: 8 days   Total time spent in review of labs and imaging, patient evaluation, formulation of plan, documentation and communication with family: 25 minutes  Lanae Boast, MD [facility]  03/24/2023, 11:24 AM

## 2023-03-24 NOTE — Plan of Care (Signed)
  Problem: Clinical Measurements: Goal: Will remain free from infection Outcome: Progressing   Problem: Clinical Measurements: Goal: Diagnostic test results will improve Outcome: Progressing   Problem: Pain Management: Goal: General experience of comfort will improve Outcome: Progressing

## 2023-03-25 DIAGNOSIS — J189 Pneumonia, unspecified organism: Secondary | ICD-10-CM | POA: Diagnosis not present

## 2023-03-25 DIAGNOSIS — E162 Hypoglycemia, unspecified: Secondary | ICD-10-CM | POA: Diagnosis not present

## 2023-03-25 LAB — GLUCOSE, CAPILLARY: Glucose-Capillary: 103 mg/dL — ABNORMAL HIGH (ref 70–99)

## 2023-03-25 MED ORDER — OXYCODONE-ACETAMINOPHEN 7.5-325 MG PO TABS: 1.0000 | ORAL_TABLET | Freq: Four times a day (QID) | ORAL | 0 refills | Status: AC | PRN

## 2023-03-25 MED ORDER — ACETAMINOPHEN 325 MG PO TABS: 650.0000 mg | ORAL_TABLET | Freq: Four times a day (QID) | ORAL | Status: DC | PRN

## 2023-03-25 MED ORDER — DOCUSATE SODIUM 100 MG PO CAPS
100.0000 mg | ORAL_CAPSULE | Freq: Two times a day (BID) | ORAL | Status: DC
  Administered 2023-03-25: 100 mg via ORAL
  Filled 2023-03-25: qty 1

## 2023-03-25 MED ORDER — SODIUM THIOSULFATE 250 MG/ML IV SOLN: 25.0000 g | INTRAVENOUS | Status: DC

## 2023-03-25 MED ORDER — RENA-VITE PO TABS: 1.0000 | ORAL_TABLET | Freq: Every day | ORAL | Status: DC

## 2023-03-25 NOTE — Plan of Care (Signed)
  Problem: Education: Goal: Knowledge of General Education information will improve Description: Including pain rating scale, medication(s)/side effects and non-pharmacologic comfort measures Outcome: Progressing   Problem: Activity: Goal: Risk for activity intolerance will decrease Outcome: Progressing   Problem: Pain Management: Goal: General experience of comfort will improve Outcome: Progressing

## 2023-03-25 NOTE — Progress Notes (Signed)
[facility] Progress Note   Subjective:   Seen in room. No CP/dyspnea. Ready for discharge. Gave 3 options to get back on HD schedule - short HD today, try to arrange outpatient HD tomorrow, or pick up next HD Friday. She declines HD today - very tired. Called outpatient unit - no openings for tomorrow at the moment but they will call her in if they have a call out tomorrow - she is agreeable with this.  Objective Vitals:   03/24/23 2037 03/25/23 0500 03/25/23 0501 03/25/23 0803  BP: (!) 180/72  125/69 (!) 124/111  Pulse: 77  72 77  Resp: 17  18   Temp: (!) 97.3 F (36.3 C)  97.8 F (36.6 C)   TempSrc: Oral  Oral   SpO2: 93%  100% 95%  Weight:  118.8 kg    Height:       Physical Exam General: Chronically ill appearing woman, NAD. Nasal O2 in place Heart: RRR; no murmur Lungs: CTAB; no rales Abdomen: obese, many bandages around abdomen Extremities: B AKA, R 1st finger ischemic changes Dialysis Access:  TDC in R chest  Additional Objective Labs: Basic Metabolic Panel: Recent Labs  Lab 03/20/23 1047 03/23/23 1847 03/24/23 1500  NA 133* 133* 130*  K 4.4 5.1 5.4*  CL 97* 95* 93*  CO2 21* 21* 21*  GLUCOSE 62* 101* 86  BUN 33* 44* 54*  CREATININE 7.06* 8.78* 9.41*  CALCIUM 9.1 9.7 9.4  PHOS 6.0*  --  7.5*   Liver Function Tests: Recent Labs  Lab 03/20/23 1047 03/24/23 1500  ALBUMIN 3.3* 3.4*   CBC: Recent Labs  Lab 03/19/23 1458 03/20/23 1047 03/23/23 1847 03/24/23 1500  WBC 10.3 9.4 9.7 8.9  NEUTROABS 6.9  --  6.6  --   HGB 10.0* 11.4* 10.6* 10.9*  HCT 33.0* 37.1 34.7* 35.6*  MCV 97.9 98.1 99.7 100.8*  PLT 208 203 172 154   Medications:  sodium thiosulfate 25 g in sodium chloride 0.9 % 200 mL Infusion for Calciphylaxis      aspirin EC  81 mg Oral Daily   atorvastatin  10 mg Oral Daily   docusate sodium  100 mg Oral BID   famotidine  20 mg Oral Daily   feeding supplement (NEPRO CARB STEADY)  237 mL Oral BID BM   heparin  5,000 Units  Subcutaneous Q8H   levothyroxine  100 mcg Oral Daily   midodrine  10 mg Oral Q M,W,F-HD   multivitamin  1 tablet Oral QHS   pantoprazole  40 mg Oral QHS   saccharomyces boulardii  250 mg Oral BID   senna  2 tablet Oral Daily   sucroferric oxyhydroxide  1,000 mg Oral TID WC    Dialysis Orders: MWF - AF 4hr, 400/A1.5, EDW 119.8kg, 2K/2Ca, UFP #2, TDC, heparin 9000 units q HD - Hectoral IV q HD -> needs to be stopped - Hx calciphylaxis - Mircera IV q 2 weeks (last given on 11/24) - Venofer 50mg  IV q week - Na Thio 25g q HD     Assessment/Plan: Somnolence: Not uremic, felt due to medication combination Lyrica + gabapentin -> both on hold for now and should be avoided permanently. Resolved with medication adjustment + HD. Hypoglycemia: Diet adjustments per primary, not getting insulin. ESRD: Usual MWF, off schedule here - dialyzed 12/17. See above. Hopefully her outpatient clinic will be able to work her in tomorrow. If not, then next HD on Friday. BP/volume: On midodrine  pre-HD, no dyspnea/edema. Anemia of ESRD: Hgb 10.9 - resume ESA as outpatient. Secondary HPTH: CorrCa + Phos high, VDRA held (should not be restarted as outpatient). 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 Continue Velphoro as binder, ^ dose.  Abd pannus calciphylaxis: Continue Na Thio 25g q HD and continue wound care Ischemic finger(s) R hand: Work up per [facility], known PAD. Told me agreeable for amputation, but has been refusing angiography per [facility] note. Disposition: Going back to prior SNF, but she is interested in alternative placements.  Ozzie Hoyle, PA-C 03/25/2023, 12:48 PM  [facility]

## 2023-03-25 NOTE — Progress Notes (Signed)
D/C order noted. Contacted [facility] to be advised of pt's d/c today and that pt should resume on Friday (unless clinic has a an opening tomorrow which renal PA contacted clinic about). Clinic advised pt to return to previous snf.   Olivia Canter Renal Navigator [phone]

## 2023-03-25 NOTE — TOC Transition Note (Signed)
Transition of Care [facility]) - Discharge Note   Patient Details  Name: Courtney Gray MRN: [id number] Date of Birth: 1970/11/13  Transition of Care [facility]) CM/SW Contact:  Tafari Humiston A [place], LCSWA Phone Number: 03/25/2023, 1:29 PM   Clinical Narrative:     Patient will DC to: [facility]  Anticipated DC date: 03/25/23  Family notified: Debroah Baller  Transport by: Sharin Mons      Per MD patient ready for DC to [facility] . RN, patient, patient's family, and facility notified of DC. Discharge Summary and FL2 sent to facility. RN to call report prior to discharge [phone], Room 158B). DC packet on chart. Ambulance transport requested for patient.     CSW will sign off for now as social work intervention is no longer needed. Please consult [place] again if new needs arise.    Final next level of care: Skilled Nursing Facility Barriers to Discharge: Barriers Resolved   Patient Goals and CMS Choice            Discharge Placement              Patient chooses bed at:  [facility]) Patient to be transferred to facility by: [facility] Name of family member notified: Rosielee Frederking Patient and family notified of of transfer: 03/25/23  Discharge Plan and Services Additional resources added to the After Visit Summary for                                       Social Drivers of Health (SDOH) Interventions SDOH Screenings   Food Insecurity: No Food Insecurity (03/16/2023)  Housing: Low Risk  (03/16/2023)  Transportation Needs: No Transportation Needs (03/16/2023)  Utilities: Not At Risk (03/16/2023)  Financial Resource Strain: High Risk (03/27/2018)   Received from [facility], [facility]  Social Connections: Unknown (08/20/2021)   [facility] from [facility], [facility]  Tobacco Use: Low Risk  (03/16/2023)  Health Literacy: Low Risk  (11/25/2022)   Received from [facility]     Readmission Risk Interventions    09/16/2021    4:36 PM 08/28/2021    11:50 AM 07/12/2021   12:38 PM  Readmission Risk Prevention Plan  Transportation Screening Complete Complete Complete  Medication Review (RN Care Manager)  Complete Complete  PCP or Specialist appointment within 3-5 days of discharge  Complete Complete  HRI or Home Care Consult Complete Complete Complete  SW Recovery Care/Counseling Consult Complete Complete Complete  Palliative Care Screening  Not Applicable Not Applicable  Skilled Nursing Facility Complete Not Applicable Complete

## 2023-03-25 NOTE — Discharge Summary (Signed)
DISCHARGE SUMMARY  Courtney Gray  MR#: [id number]  DOB:01-Apr-1971  Date of Admission: 03/16/2023 Date of Discharge: 03/25/2023  Attending Physician:Alianis Trimmer Silvestre Gunner, MD  Patient's ONG:EXBMWUX, Provider, MD  Disposition: D/C to SNF for LTC  Follow-up Appts:  Follow-up Information     Daria Pastures, MD Follow up in 3 week(s).   Specialty: Vascular Surgery Why: Office will call you to arrange your appt (sent) Contact information: [address] 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 (Active)     Initial presentation: 52 year old LTC SNF resident with a history of HTN, ESRD on HD MWF, NSTEMI, obesity, DM2, PAD status post bilateral AKA, chronic hypoxic respiratory failure, and hypothyroidism who presented to the ER 03/16/2023 with generalized fatigue and altered mental status.     Ultimately it was discovered that she was experiencing probable hypoglycemic episodes leading to her encephalopathy.  Her hypoglycemia resolved after she was placed on a more liberal diet.   During the course of her stay she also experienced significant pain in her fingers  which prompted a vascular surgery evaluation.  Arterial duplex was without evidence of significant stenosis or occlusion.  Surgery advised angiography with possible intervention and opined that the patient was high risk for finger amputation.  At this time patient does not desire intervention.  Hospital Course:  Metabolic encephalopathy secondary to polypharmacy and symptomatic hypoglycemia CBGs dipped as low as 25 -adrenal insufficiency ruled out with normal cosyntropin stim test -this was felt to be related to ESRD complicated by poor oral intake and polypharmacy - CBG stabilized after liberalization of diet -polypharmacy corrected with discontinuation of multiple sensorium affecting medications - DO NOT RESUME lyrica, neurontin, or flexeril    [facility] pneumonia Pulmonary infiltrate noted on CT chest -has completed a course of antibiotic therapy   Bilateral upper extremity fingertip ischemia Right index fingertip with some ulceration -no evidence of calciphylaxis -felt to be due to compromised blood flow -angiography and possible intervention recommended by vascular surgery but patient opted not to pursue - f/u w/ [facility] as outpatient    PAD status post bilateral AKA   ESRD with HD MWF Care as per Nephrology - has a HD catheter in place    Thrombosis of tridialysis catheter CT chest this admission notes a 4.8 x 3 3.7 cm mass with patchy calcifications in the right atrium stable since June 2023 and consistent with a dialysis catheter related calcified thrombus, as well as a calcified linear thrombus extending along the course of the hemodialysis catheter in the SVC - these findings are not new, and will be followed by Nephrology    History of calciphylaxis of abdominal pannus Patient currently has an abdominal wound which is healing -continue  sodium thiosulfate 3 times weekly   Chronic systolic CHF TTE 6/96 noted EF 35% with global hypokinesis -volume management per dialysis   Anemia of  chronic kidney disease Hemoglobin has been stable -erythropoietin per nephrology   Hypothyroidism TSH stable -continue usual home Synthroid dose   Prior history of HTN Blood pressure has been low during this admission -adrenal insufficiency ruled out -antihypertensives have been discontinued   Pressure Injury 03/17/23 Thigh Posterior;Proximal;Right Stage 4 - Full thickness tissue loss with exposed bone, tendon or muscle. (Active)  03/17/23 2000  Location: Thigh  Location Orientation: Posterior;Proximal;Right  Staging: Stage 4 - Full thickness tissue loss with exposed bone, tendon or muscle.  Wound Description (Comments):   Present on Admission: Yes       Allergies as of 03/25/2023       Reactions   Benzonatate Other (See Comments)   Hallucinations   Hydralazine Other (See Comments)   Hallucinations   Amoxicillin Itching, Nausea And Vomiting   Clonidine Other (See Comments)   Altered mental state, insomnia    Chlorhexidine Itching   Patient reports it is the CHG baths that cause her itching not the CHG used for caring for her IV/HD access.     Morphine Itching        Medication List     STOP taking these medications    cephALEXin 500 MG capsule Commonly known as: KEFLEX   cyclobenzaprine 10 MG tablet Commonly known as: FLEXERIL   fentaNYL 25 MCG/HR Commonly known as: DURAGESIC   gabapentin 300 MG capsule Commonly known as: NEURONTIN   HYDROcodone-acetaminophen 5-325 MG tablet Commonly known as: NORCO/VICODIN   oxyCODONE-acetaminophen 5-325 MG tablet Commonly known as: PERCOCET/ROXICET Replaced by: oxyCODONE-acetaminophen 7.5-325 MG tablet   pregabalin 75 MG capsule Commonly known as: LYRICA       TAKE these medications    acetaminophen 325 MG tablet Commonly known as: TYLENOL Take 2 tablets (650 mg total) by mouth every 6 (six) hours as needed for mild pain (pain score 1-3) (or Fever >/= 101).   aspirin EC 81 MG tablet Take 1 tablet (81 mg  total) by mouth daily. Swallow whole.   atorvastatin 10 MG tablet Commonly known as: LIPITOR Take 1 tablet (10 mg total) by mouth daily.   Darbepoetin Alfa 200 MCG/0.4ML Sosy injection Commonly known as: ARANESP Inject 200 mcg into the skin once a week. Every Tuesday   dicyclomine 10 MG capsule Commonly known as: BENTYL Take 10 mg by mouth in the morning, at noon, and at bedtime.   docusate sodium 100 MG capsule Commonly known as: COLACE Take 100 mg by mouth 2 (two) times daily.   famotidine 20 MG tablet Commonly known as: PEPCID Take 1 tablet (20 mg total) by mouth daily.   guaiFENesin 100 MG/5ML liquid Commonly known as: ROBITUSSIN Take 100 mg by mouth every 4 (four) hours as needed for cough.   levothyroxine 100 MCG tablet Commonly known as: SYNTHROID Take 100 mcg by mouth daily.   lidocaine 5 % Commonly known as: LIDODERM Place 1 patch onto the skin daily. Remove & Discard patch within 12 hours or as directed by MD   lidocaine 5 % ointment Commonly known as: XYLOCAINE Apply 1 Application topically See admin instructions. 1 application every 2 hours as needed for pain   midodrine 10 MG tablet Commonly known as: PROAMATINE Take 1 tablet (10 mg total) by mouth every Monday, Wednesday, and Friday at 8 PM. Before hemodialysis What changed:  when to take  this additional instructions   multivitamin Tabs tablet Take 1 tablet by mouth at bedtime.   nutrition supplement (JUVEN) Pack Take 1 packet by mouth 2 (two) times daily between meals.   oxyCODONE-acetaminophen 7.5-325 MG tablet Commonly known as: PERCOCET Take 1 tablet by mouth every 6 (six) hours as needed for up to 3 days for severe pain (pain score 7-10). Replaces: oxyCODONE-acetaminophen 5-325 MG tablet   OXYGEN Inhale 4 L into the lungs See admin instructions. Oxygen at 4L hr via nasal cannula and will use portable oxygen machine when up in wheelchair.   pantoprazole 40 MG tablet Commonly known as:  PROTONIX Take 1 tablet (40 mg total) by mouth daily.   senna 8.6 MG Tabs tablet Commonly known as: SENOKOT Take 2 tablets (17.2 mg total) by mouth daily.   simethicone 80 MG chewable tablet Commonly known as: MYLICON Chew 80 mg by mouth every 6 (six) hours as needed for flatulence.   sodium chloride 0.9 % SOLN 100 mL with sodium thiosulfate 250 MG/ML SOLN 25 g Inject 25 g into the vein every Monday, Wednesday, and Friday with hemodialysis.   Velphoro 500 MG chewable tablet Generic drug: sucroferric oxyhydroxide Chew 500 mg by mouth 3 (three) times daily with meals.   Veltassa 16.8 g Pack Generic drug: Patiromer Sorbitex Calcium Take 16.8 g by mouth every Monday, Wednesday, and Friday. What changed: when to take this               Discharge Care Instructions  (From admission, onward)           Start     Ordered   03/23/23 0000  Discharge wound care:       Comments: Foam dressings to abdominal wounds.  Aquacel for protection to right ischial wound ([facility]WSON # [id number])  cover with foam dressing and change every other day.  Fingers can be left open to air or covered with foam dressings per patient comfort.  Change every other day   03/23/23 1315            Day of Discharge BP (!) 124/111 (BP Location: Left Arm)   Pulse 77   Temp 97.8 F (36.6 C) (Oral)   Resp 18   Ht 3\' 9"  (1.143 m)   Wt 118.8 kg   SpO2 95%   BMI 90.97 kg/m   Physical Exam: General: No acute respiratory distress Lungs: Clear to auscultation bilaterally without wheezes or crackles Cardiovascular: Regular rate and rhythm without murmur gallop or rub normal S1 and S2 Abdomen: Nontender, nondistended, soft, bowel sounds positive, no rebound, no ascites, no appreciable mass Extremities: No significant edema bilateral lower extremity AKA  Basic Metabolic Panel: Recent Labs  Lab 03/19/23 1458 03/20/23 1047 03/23/23 1847 03/24/23 1500  NA 132* 133* 133* 130*  K 4.8 4.4 5.1 5.4*  CL 94*  97* 95* 93*  CO2 24 21* 21* 21*  GLUCOSE 82 62* 101* 86  BUN 56* 33* 44* 54*  CREATININE 10.82* 7.06* 8.78* 9.41*  CALCIUM 8.9 9.1 9.7 9.4  MG  --  2.2  --   --   PHOS  --  6.0*  --  7.5*    CBC: Recent Labs  Lab 03/19/23 1458 03/20/23 1047 03/23/23 1847 03/24/23 1500  WBC 10.3 9.4 9.7 8.9  NEUTROABS 6.9  --  6.6  --   HGB 10.0* 11.4* 10.6* 10.9*  HCT 33.0* 37.1 34.7* 35.6*  MCV 97.9 98.1 99.7 100.8*  PLT 208 203 172 154  Recent Results (from the past 240 hours)  Resp panel by RT-PCR (RSV, Flu A&B, Covid) Anterior Nasal Swab     Status: None   Collection Time: 03/16/23 10:23 AM   Specimen: Anterior Nasal Swab  Result Value Ref Range Status   SARS Coronavirus 2 by RT PCR NEGATIVE NEGATIVE Final   Influenza A by PCR NEGATIVE NEGATIVE Final   Influenza B by PCR NEGATIVE NEGATIVE Final    Comment: (NOTE) The Xpert Xpress SARS-CoV-2/FLU/RSV plus assay is intended as an aid in the diagnosis of influenza from Nasopharyngeal swab specimens and should not be used as a sole basis for treatment. Nasal washings and aspirates are unacceptable for Xpert Xpress SARS-CoV-2/FLU/RSV testing.  Fact Sheet for Patients: BloggerCourse.com  Fact Sheet for Healthcare Providers: SeriousBroker.it  This test is not yet approved or cleared by the [place] FDA and has been authorized for detection and/or diagnosis of SARS-CoV-2 by FDA under an Emergency Use Authorization (EUA). This EUA will remain in effect (meaning this test can be used) for the duration of the COVID-19 declaration under Section 564(b)(1) of the Act, 21 U.S.C. section 360bbb-3(b)(1), unless the authorization is terminated or revoked.     Resp Syncytial Virus by PCR NEGATIVE NEGATIVE Final    Comment: (NOTE) Fact Sheet for Patients: BloggerCourse.com  Fact Sheet for Healthcare Providers: SeriousBroker.it  This  test is not yet approved or cleared by the [place] FDA and has been authorized for detection and/or diagnosis of SARS-CoV-2 by FDA under an Emergency Use Authorization (EUA). This EUA will remain in effect (meaning this test can be used) for the duration of the COVID-19 declaration under Section 564(b)(1) of the Act, 21 U.S.C. section 360bbb-3(b)(1), unless the authorization is terminated or revoked.  Performed at [facility], [address], [place], [place] [address]   Culture, blood (Routine X 2) w Reflex to ID Panel     Status: None   Collection Time: 03/17/23  1:53 AM   Specimen: BLOOD LEFT HAND  Result Value Ref Range Status   Specimen Description BLOOD LEFT HAND  Final   Special Requests   Final    BOTTLES DRAWN AEROBIC AND ANAEROBIC Blood Culture results may not be optimal due to an inadequate volume of blood received in culture bottles   Culture   Final    NO GROWTH 5 DAYS Performed at [facility], [address], [place], [place] [address]    Report Status 03/22/2023 FINAL  Final  MRSA Next Gen by PCR, Nasal     Status: None   Collection Time: 03/17/23  9:25 PM   Specimen: Nasal Mucosa; Nasal Swab  Result Value Ref Range Status   MRSA by PCR Next Gen NOT DETECTED NOT DETECTED Final    Comment: (NOTE) The GeneXpert MRSA Assay (FDA approved for NASAL specimens only), is one component of a comprehensive MRSA colonization surveillance program. It is not intended to diagnose MRSA infection nor to guide or monitor treatment for MRSA infections. Test performance is not FDA approved in patients less than 76 years old. Performed at [facility], [address], [place], [place] [address]       Time spent in discharge (includes decision making & examination of pt): 35 minutes  03/25/2023, 12:30 PM   Lonia Blood, MD [facility] Office  [phone]

## 2023-03-25 NOTE — Discharge Planning (Signed)
[facility] Patient Discharge Orders - [facility] CLINIC: [facility]  Patient's name: Courtney Gray Admit/DC Dates: 03/16/2023 - 03/25/23  DISCHARGE DIAGNOSES: AMS/somnolence - felt due to gabapentin/Lyrica -> both stopped permanently   Hypoglycemia - better after HD and resuming regular diet R 1st finger ischemia - declined inpatient angiography/amputation, will be pursued as outpatient  HD ORDER CHANGES: Heparin change: no EDW Change: yes New EDW: 118.5kg Bath Change: no  ANEMIA MANAGEMENT: Aranesp: Given: no    ESA dose for discharge: mircera 30 mcg IV q 2 weeks, to start on 03/27/23 IV Iron dose at discharge: per protocol Transfusion: Given: no  BONE/MINERAL MEDICATIONS: Hectorol/Calcitriol change: YES - stop hectoral/stay off due to abdominal pannus calciphylaxis lesions Sensipar/Parsabiv change: no  ACCESS INTERVENTION/CHANGE: no Details:   RECENT LABS: Recent Labs  Lab 03/24/23 1500  HGB 10.9*  NA 130*  K 5.4*  CALCIUM 9.4  PHOS 7.5*  ALBUMIN 3.4*   IV ANTIBIOTICS: no Details:  OTHER ANTICOAGULATION: On Coumadin?: no   OTHER/APPTS/LAB ORDERS:  - Continue Na Thiosulfate for abd pannus calciphylaxis  - IF CALL OUT TOMORROW, TRY TO ARRANGE HD TOMORROW AFTERNOON (12/19)  - ADD LYRICA/GABAPENTIN TO ALLERGY LIST (oversedation/muscle weakness)  D/C Meds to be reconciled by nurse after every discharge.  Completed By: Ozzie Hoyle, PA-C [facility] Pager [phone]   Reviewed by: MD:______ RN_______

## 2023-03-25 NOTE — Progress Notes (Signed)
Called report to [facility] at [phone]. Report given to Precious.

## 2023-03-31 ENCOUNTER — Ambulatory Visit: Payer: 59 | Admitting: Cardiovascular Disease

## 2023-04-07 ENCOUNTER — Encounter (HOSPITAL_COMMUNITY): Payer: Self-pay

## 2023-04-07 ENCOUNTER — Other Ambulatory Visit: Payer: Self-pay

## 2023-04-07 ENCOUNTER — Inpatient Hospital Stay (HOSPITAL_COMMUNITY)
Admission: EM | Admit: 2023-04-07 | Discharge: 2023-04-10 | DRG: 091 | Disposition: A | Discharge status: Skilled Nursing Facility | Payer: 59 | Source: Skilled Nursing Facility | Attending: Family Medicine | Admitting: Family Medicine

## 2023-04-07 ENCOUNTER — Emergency Department (HOSPITAL_COMMUNITY): Payer: 59

## 2023-04-07 DIAGNOSIS — Z6841 Body Mass Index (BMI) 40.0 and over, adult: Secondary | ICD-10-CM

## 2023-04-07 DIAGNOSIS — E875 Hyperkalemia: Principal | ICD-10-CM | POA: Diagnosis present

## 2023-04-07 DIAGNOSIS — N2581 Secondary hyperparathyroidism of renal origin: Secondary | ICD-10-CM | POA: Diagnosis present

## 2023-04-07 DIAGNOSIS — I252 Old myocardial infarction: Secondary | ICD-10-CM

## 2023-04-07 DIAGNOSIS — E11649 Type 2 diabetes mellitus with hypoglycemia without coma: Secondary | ICD-10-CM | POA: Diagnosis present

## 2023-04-07 DIAGNOSIS — I959 Hypotension, unspecified: Secondary | ICD-10-CM | POA: Diagnosis present

## 2023-04-07 DIAGNOSIS — G9341 Metabolic encephalopathy: Secondary | ICD-10-CM | POA: Diagnosis present

## 2023-04-07 DIAGNOSIS — Z89611 Acquired absence of right leg above knee: Secondary | ICD-10-CM

## 2023-04-07 DIAGNOSIS — E1152 Type 2 diabetes mellitus with diabetic peripheral angiopathy with gangrene: Secondary | ICD-10-CM | POA: Diagnosis present

## 2023-04-07 DIAGNOSIS — Z992 Dependence on renal dialysis: Secondary | ICD-10-CM | POA: Diagnosis not present

## 2023-04-07 DIAGNOSIS — E039 Hypothyroidism, unspecified: Secondary | ICD-10-CM | POA: Diagnosis present

## 2023-04-07 DIAGNOSIS — J449 Chronic obstructive pulmonary disease, unspecified: Secondary | ICD-10-CM | POA: Diagnosis present

## 2023-04-07 DIAGNOSIS — E872 Acidosis, unspecified: Secondary | ICD-10-CM | POA: Diagnosis present

## 2023-04-07 DIAGNOSIS — E162 Hypoglycemia, unspecified: Secondary | ICD-10-CM | POA: Diagnosis present

## 2023-04-07 DIAGNOSIS — E1122 Type 2 diabetes mellitus with diabetic chronic kidney disease: Secondary | ICD-10-CM | POA: Diagnosis present

## 2023-04-07 DIAGNOSIS — Z833 Family history of diabetes mellitus: Secondary | ICD-10-CM

## 2023-04-07 DIAGNOSIS — Z7982 Long term (current) use of aspirin: Secondary | ICD-10-CM

## 2023-04-07 DIAGNOSIS — L89314 Pressure ulcer of right buttock, stage 4: Secondary | ICD-10-CM | POA: Diagnosis present

## 2023-04-07 DIAGNOSIS — Z794 Long term (current) use of insulin: Secondary | ICD-10-CM

## 2023-04-07 DIAGNOSIS — Z91158 Patient's noncompliance with renal dialysis for other reason: Secondary | ICD-10-CM

## 2023-04-07 DIAGNOSIS — D61818 Other pancytopenia: Secondary | ICD-10-CM | POA: Diagnosis present

## 2023-04-07 DIAGNOSIS — I132 Hypertensive heart and chronic kidney disease with heart failure and with stage 5 chronic kidney disease, or end stage renal disease: Secondary | ICD-10-CM | POA: Diagnosis present

## 2023-04-07 DIAGNOSIS — Z885 Allergy status to narcotic agent status: Secondary | ICD-10-CM

## 2023-04-07 DIAGNOSIS — J9611 Chronic respiratory failure with hypoxia: Secondary | ICD-10-CM | POA: Diagnosis present

## 2023-04-07 DIAGNOSIS — Z1152 Encounter for screening for COVID-19: Secondary | ICD-10-CM

## 2023-04-07 DIAGNOSIS — Z79899 Other long term (current) drug therapy: Secondary | ICD-10-CM

## 2023-04-07 DIAGNOSIS — N186 End stage renal disease: Secondary | ICD-10-CM | POA: Diagnosis present

## 2023-04-07 DIAGNOSIS — I051 Rheumatic mitral insufficiency: Secondary | ICD-10-CM | POA: Diagnosis present

## 2023-04-07 DIAGNOSIS — G928 Other toxic encephalopathy: Secondary | ICD-10-CM | POA: Diagnosis present

## 2023-04-07 DIAGNOSIS — E7800 Pure hypercholesterolemia, unspecified: Secondary | ICD-10-CM | POA: Diagnosis present

## 2023-04-07 DIAGNOSIS — Z8639 Personal history of other endocrine, nutritional and metabolic disease: Secondary | ICD-10-CM

## 2023-04-07 DIAGNOSIS — Z9981 Dependence on supplemental oxygen: Secondary | ICD-10-CM | POA: Diagnosis not present

## 2023-04-07 DIAGNOSIS — R748 Abnormal levels of other serum enzymes: Secondary | ICD-10-CM | POA: Diagnosis not present

## 2023-04-07 DIAGNOSIS — I739 Peripheral vascular disease, unspecified: Secondary | ICD-10-CM | POA: Diagnosis present

## 2023-04-07 DIAGNOSIS — I96 Gangrene, not elsewhere classified: Secondary | ICD-10-CM | POA: Diagnosis present

## 2023-04-07 DIAGNOSIS — Z883 Allergy status to other anti-infective agents status: Secondary | ICD-10-CM

## 2023-04-07 DIAGNOSIS — Z880 Allergy status to penicillin: Secondary | ICD-10-CM

## 2023-04-07 DIAGNOSIS — R4182 Altered mental status, unspecified: Secondary | ICD-10-CM | POA: Diagnosis present

## 2023-04-07 DIAGNOSIS — Z89612 Acquired absence of left leg above knee: Secondary | ICD-10-CM

## 2023-04-07 DIAGNOSIS — I5043 Acute on chronic combined systolic (congestive) and diastolic (congestive) heart failure: Secondary | ICD-10-CM | POA: Diagnosis not present

## 2023-04-07 DIAGNOSIS — M898X9 Other specified disorders of bone, unspecified site: Secondary | ICD-10-CM | POA: Diagnosis present

## 2023-04-07 DIAGNOSIS — Z79890 Hormone replacement therapy (postmenopausal): Secondary | ICD-10-CM

## 2023-04-07 DIAGNOSIS — M1990 Unspecified osteoarthritis, unspecified site: Secondary | ICD-10-CM | POA: Diagnosis present

## 2023-04-07 DIAGNOSIS — E8770 Fluid overload, unspecified: Secondary | ICD-10-CM | POA: Diagnosis not present

## 2023-04-07 DIAGNOSIS — I5022 Chronic systolic (congestive) heart failure: Secondary | ICD-10-CM | POA: Diagnosis present

## 2023-04-07 DIAGNOSIS — T4275XA Adverse effect of unspecified antiepileptic and sedative-hypnotic drugs, initial encounter: Secondary | ICD-10-CM | POA: Diagnosis present

## 2023-04-07 DIAGNOSIS — Z888 Allergy status to other drugs, medicaments and biological substances status: Secondary | ICD-10-CM

## 2023-04-07 DIAGNOSIS — I9589 Other hypotension: Secondary | ICD-10-CM | POA: Diagnosis present

## 2023-04-07 LAB — COMPREHENSIVE METABOLIC PANEL
ALT: 82 U/L — ABNORMAL HIGH (ref 0–44)
AST: 107 U/L — ABNORMAL HIGH (ref 15–41)
Albumin: 3.5 g/dL (ref 3.5–5.0)
Alkaline Phosphatase: 102 U/L (ref 38–126)
Anion gap: 21 — ABNORMAL HIGH (ref 5–15)
BUN: 81 mg/dL — ABNORMAL HIGH (ref 6–20)
CO2: 17 mmol/L — ABNORMAL LOW (ref 22–32)
Calcium: 9.3 mg/dL (ref 8.9–10.3)
Chloride: 94 mmol/L — ABNORMAL LOW (ref 98–111)
Creatinine, Ser: 13.58 mg/dL — ABNORMAL HIGH (ref 0.44–1.00)
GFR, Estimated: 3 mL/min — ABNORMAL LOW (ref >60)
Glucose, Bld: 69 mg/dL — ABNORMAL LOW (ref 70–99)
Potassium: 6 mmol/L — ABNORMAL HIGH (ref 3.5–5.1)
Sodium: 132 mmol/L — ABNORMAL LOW (ref 135–145)
Total Bilirubin: 2 mg/dL — ABNORMAL HIGH (ref 0.0–1.2)
Total Protein: 7.5 g/dL (ref 6.5–8.1)

## 2023-04-07 LAB — I-STAT CHEM 8, ED
BUN: 71 mg/dL — ABNORMAL HIGH (ref 6–20)
Calcium, Ion: 0.95 mmol/L — ABNORMAL LOW (ref 1.15–1.40)
Chloride: 100 mmol/L (ref 98–111)
Creatinine, Ser: 13.9 mg/dL — ABNORMAL HIGH (ref 0.44–1.00)
Glucose, Bld: 67 mg/dL — ABNORMAL LOW (ref 70–99)
HCT: 42 % (ref 36.0–46.0)
Hemoglobin: 14.3 g/dL (ref 12.0–15.0)
Potassium: 5.8 mmol/L — ABNORMAL HIGH (ref 3.5–5.1)
Sodium: 131 mmol/L — ABNORMAL LOW (ref 135–145)
TCO2: 19 mmol/L — ABNORMAL LOW (ref 22–32)

## 2023-04-07 LAB — CBC WITH DIFFERENTIAL/PLATELET
Abs Immature Granulocytes: 0.02 10*3/uL (ref 0.00–0.07)
Basophils Absolute: 0.1 10*3/uL (ref 0.0–0.1)
Basophils Relative: 1 %
Eosinophils Absolute: 0.4 10*3/uL (ref 0.0–0.5)
Eosinophils Relative: 5 %
HCT: 35.8 % — ABNORMAL LOW (ref 36.0–46.0)
Hemoglobin: 11.7 g/dL — ABNORMAL LOW (ref 12.0–15.0)
Immature Granulocytes: 0 %
Lymphocytes Relative: 13 %
Lymphs Abs: 1.1 10*3/uL (ref 0.7–4.0)
MCH: 31.7 pg (ref 26.0–34.0)
MCHC: 32.7 g/dL (ref 30.0–36.0)
MCV: 97 fL (ref 80.0–100.0)
Monocytes Absolute: 0.7 10*3/uL (ref 0.1–1.0)
Monocytes Relative: 8 %
Neutro Abs: 5.8 10*3/uL (ref 1.7–7.7)
Neutrophils Relative %: 73 %
Platelets: 74 10*3/uL — ABNORMAL LOW (ref 150–400)
RBC: 3.69 MIL/uL — ABNORMAL LOW (ref 3.87–5.11)
RDW: 22.3 % — ABNORMAL HIGH (ref 11.5–15.5)
WBC: 8 10*3/uL (ref 4.0–10.5)
nRBC: 0.4 % — ABNORMAL HIGH (ref 0.0–0.2)

## 2023-04-07 LAB — HEPATITIS B SURFACE ANTIGEN: Hepatitis B Surface Ag: NONREACTIVE

## 2023-04-07 LAB — RESP PANEL BY RT-PCR (RSV, FLU A&B, COVID)  RVPGX2
Influenza A by PCR: NEGATIVE
Influenza B by PCR: NEGATIVE
Resp Syncytial Virus by PCR: NEGATIVE
SARS Coronavirus 2 by RT PCR: NEGATIVE

## 2023-04-07 LAB — GLUCOSE, CAPILLARY
Glucose-Capillary: 49 mg/dL — ABNORMAL LOW (ref 70–99)
Glucose-Capillary: 60 mg/dL — ABNORMAL LOW (ref 70–99)
Glucose-Capillary: 176 mg/dL — ABNORMAL HIGH (ref 70–99)

## 2023-04-07 LAB — I-STAT CG4 LACTIC ACID, ED
Lactic Acid, Venous: 2.4 mmol/L (ref 0.5–1.9)
Lactic Acid, Venous: 2.6 mmol/L (ref 0.5–1.9)

## 2023-04-07 LAB — CBG MONITORING, ED: Glucose-Capillary: 78 mg/dL (ref 70–99)

## 2023-04-07 MED ORDER — OXYCODONE HCL 5 MG PO TABS
5.0000 mg | ORAL_TABLET | Freq: Four times a day (QID) | ORAL | Status: DC | PRN
  Administered 2023-04-07 – 2023-04-10 (×4): 5 mg via ORAL
  Filled 2023-04-07 (×5): qty 1

## 2023-04-07 MED ORDER — LEVOTHYROXINE SODIUM 100 MCG PO TABS
100.0000 ug | ORAL_TABLET | Freq: Every day | ORAL | Status: DC
  Administered 2023-04-08 – 2023-04-10 (×3): 100 ug via ORAL
  Filled 2023-04-07 (×3): qty 1

## 2023-04-07 MED ORDER — PANTOPRAZOLE SODIUM 40 MG PO TBEC
40.0000 mg | DELAYED_RELEASE_TABLET | Freq: Every day | ORAL | Status: DC
  Administered 2023-04-07 – 2023-04-10 (×4): 40 mg via ORAL
  Filled 2023-04-07 (×4): qty 1

## 2023-04-07 MED ORDER — MIDODRINE HCL 5 MG PO TABS
10.0000 mg | ORAL_TABLET | Freq: Three times a day (TID) | ORAL | Status: DC
  Administered 2023-04-07: 10 mg via ORAL
  Filled 2023-04-07 (×2): qty 2

## 2023-04-07 MED ORDER — HEPARIN SODIUM (PORCINE) 1000 UNIT/ML IJ SOLN
INTRAMUSCULAR | Status: AC
  Filled 2023-04-07: qty 5

## 2023-04-07 MED ORDER — ONDANSETRON HCL 4 MG/2ML IJ SOLN: 4.0000 mg | Freq: Four times a day (QID) | INTRAMUSCULAR | Status: DC | PRN

## 2023-04-07 MED ORDER — ONDANSETRON HCL 4 MG PO TABS: 4.0000 mg | ORAL_TABLET | Freq: Four times a day (QID) | ORAL | Status: DC | PRN

## 2023-04-07 MED ORDER — CALCIUM GLUCONATE 10 % IV SOLN
1.0000 g | Freq: Once | INTRAVENOUS | Status: DC
  Filled 2023-04-07: qty 10

## 2023-04-07 MED ORDER — SENNA 8.6 MG PO TABS
2.0000 | ORAL_TABLET | Freq: Every day | ORAL | Status: DC
  Administered 2023-04-08 – 2023-04-09 (×2): 17.2 mg via ORAL
  Filled 2023-04-07 (×3): qty 2

## 2023-04-07 MED ORDER — DEXTROSE 50 % IV SOLN
1.0000 | INTRAVENOUS | Status: DC | PRN
  Administered 2023-04-07: 50 mL via INTRAVENOUS
  Filled 2023-04-07: qty 50

## 2023-04-07 MED ORDER — ALBUMIN HUMAN 25 % IV SOLN
25.0000 g | Freq: Once | INTRAVENOUS | Status: AC
  Administered 2023-04-07: 25 g via INTRAVENOUS
  Filled 2023-04-07: qty 100

## 2023-04-07 MED ORDER — ACETAMINOPHEN 650 MG RE SUPP: 650.0000 mg | Freq: Four times a day (QID) | RECTAL | Status: DC | PRN

## 2023-04-07 MED ORDER — SODIUM CHLORIDE 0.9% FLUSH
3.0000 mL | Freq: Two times a day (BID) | INTRAVENOUS | Status: DC
  Administered 2023-04-07 – 2023-04-09 (×5): 3 mL via INTRAVENOUS

## 2023-04-07 MED ORDER — ACETAMINOPHEN 325 MG PO TABS
650.0000 mg | ORAL_TABLET | Freq: Four times a day (QID) | ORAL | Status: DC | PRN
  Filled 2023-04-07: qty 2

## 2023-04-07 MED ORDER — MIDODRINE HCL 5 MG PO TABS: 10.0000 mg | ORAL_TABLET | ORAL | Status: DC

## 2023-04-07 MED ORDER — HEPARIN SODIUM (PORCINE) 1000 UNIT/ML DIALYSIS
1000.0000 [IU] | INTRAMUSCULAR | Status: DC | PRN
  Administered 2023-04-07: 4200 [IU]

## 2023-04-07 MED ORDER — ALBUTEROL SULFATE (2.5 MG/3ML) 0.083% IN NEBU: 2.5000 mg | INHALATION_SOLUTION | RESPIRATORY_TRACT | Status: DC | PRN

## 2023-04-07 MED ORDER — MIDODRINE HCL 5 MG PO TABS
10.0000 mg | ORAL_TABLET | Freq: Three times a day (TID) | ORAL | Status: DC
  Administered 2023-04-07 – 2023-04-08 (×5): 10 mg via ORAL
  Filled 2023-04-07 (×5): qty 2

## 2023-04-07 MED ORDER — SEVELAMER CARBONATE 800 MG PO TABS
800.0000 mg | ORAL_TABLET | Freq: Three times a day (TID) | ORAL | Status: DC
Start: 2023-04-08 — End: 2023-04-10
  Administered 2023-04-08 – 2023-04-09 (×4): 800 mg via ORAL
  Filled 2023-04-07 (×6): qty 1

## 2023-04-07 NOTE — Progress Notes (Signed)
   04/07/23 1254  Vitals  Pulse Rate 90  Resp 13  BP 125/89  O2 Device Nasal Cannula  Oxygen  Therapy  O2 Flow Rate (L/min) 2 L/min  Patient Activity (if Appropriate) In bed  Pulse Oximetry Type Continuous  Post Treatment  Dialyzer Clearance Lightly streaked  Liters Processed 89.1  Fluid Removed (mL) 2000 mL  Tolerated HD Treatment No (Comment) (Albumin  25 % 25 grams given during HD.)   Received patient in bed to unit.  Alert and oriented.  Informed consent signed and in chart.   TX duration:3.75 Albumin  25 % grams given for BP support during HD.  Transported back to the room  Alert, without acute distress.  Hand-off given to patient's nurse.   Access used: L CVC Access issues:   Total UF removed: Medication(s) given: See mar    Geni Seip, RN [facility]

## 2023-04-07 NOTE — Progress Notes (Signed)
 Dialysis orders written for the AM and pt will be see in the AM.  Patient dialyzes at SW MWF 4hr 2/2 bath EDW 118.5kg 400 1.5 180NRe Last tx 12/24 leaving at 118.5kg TC

## 2023-04-07 NOTE — ED Notes (Signed)
 ED TO INPATIENT HANDOFF REPORT  ED Nurse Name and Phone #: Omar NOVAK Paramedic [phone]  S Name/Age/Gender Courtney Gray 52 y.o. female Room/Bed: 034C/034C  Code Status   Code Status: Prior  Home/SNF/Other Skilled nursing facility Patient oriented to: self, place, time, and situation Is this baseline? Yes   Triage Complete: Triage complete  Chief Complaint ESRD needing dialysis (HCC) [N18.6, Z99.2]  Triage Note Pt BIB [facility] for [facility]. EMS reports A&Ox4 but slow to respond. Facility reports they think R index finger looks like gangrene. Pt is c/o of chronic back and leg pain. EMS unable to obtain BP. Dialysis MWF, last day dialysis received was Wednesday.  90 P CBG 85 98% 3L [place]   Allergies Allergies  Allergen Reactions   Benzonatate Other (See Comments)    Hallucinations   Hydralazine  Other (See Comments)    Hallucinations   Amoxicillin Itching and Nausea And Vomiting   Clonidine Other (See Comments)    Altered mental state, insomnia    Chlorhexidine  Itching    Patient reports it is the CHG baths that cause her itching not the CHG used for caring for her IV/HD access.     Morphine  Itching    Level of Care/Admitting Diagnosis ED Disposition     ED Disposition  Admit   Condition  --   Comment  Hospital Area: [facility] [[id number]]  Level of Care: Telemetry Medical [104]  May admit patient to [facility] or [facility] if equivalent level of care is available:: No  Covid Evaluation: Confirmed COVID Negative  Diagnosis: ESRD needing dialysis [facility]) [[id number]]  Admitting Physician: CHARLTON EVALENE RAMAN [[id number]]  Attending Physician: CHARLTON EVALENE RAMAN [[id number]]  Certification:: I certify this patient will need inpatient services for at least 2 midnights  Expected Medical Readiness: 04/10/2023          B Medical/Surgery History Past Medical History:  Diagnosis Date   Arthritis    Bacteremia    Calciphylaxis    Chronic HFrEF (heart failure with reduced  ejection fraction) (HCC)    Diabetes mellitus without complication (HCC)    Dialysis complication, subsequent encounter    ESRD (end stage renal disease) (HCC)    Gout    High cholesterol    Hypertension    Hypertensive emergency    Ischemic foot pain at rest 08/03/2021   Renal disorder    Right atrial mass    Severe mitral regurgitation    Past Surgical History:  Procedure Laterality Date   ABDOMINAL AORTOGRAM W/LOWER EXTREMITY N/A 08/07/2021   Procedure: ABDOMINAL AORTOGRAM W/LOWER EXTREMITY;  Surgeon: Lanis Fonda BRAVO, MD;  Location: [facility] INVASIVE CV LAB;  Service: Cardiovascular;  Laterality: N/A;   AMPUTATION Right 08/14/2021   Procedure: AMPUTATION ABOVE KNEE;  Surgeon: Gretta Lonni PARAS, MD;  Location: [facility] OR;  Service: Vascular;  Laterality: Right;   AMPUTATION Left 09/23/2021   Procedure: AMPUTATION MIDDLE FINGER;  Surgeon: Romona Harari, MD;  Location: [facility] OR;  Service: Orthopedics;  Laterality: Left;   AMPUTATION Left 10/09/2021   Procedure: LEFT ABOVE KNEE AMPUTATION;  Surgeon: Magda Debby SAILOR, MD;  Location: [facility] OR;  Service: Vascular;  Laterality: Left;   AV FISTULA PLACEMENT     x2, unsuccessful.    BUBBLE STUDY  09/27/2021   Procedure: BUBBLE STUDY;  Surgeon: Pietro Redell RAMAN, MD;  Location: [facility] ENDOSCOPY;  Service: Cardiovascular;;   IR FLUORO GUIDE CV LINE LEFT  12/14/2020   IR FLUORO GUIDE CV LINE LEFT  02/20/2021  IR FLUORO GUIDE CV LINE LEFT  09/11/2021   IR FLUORO GUIDE CV LINE LEFT  10/18/2021   IR FLUORO GUIDE CV LINE LEFT  11/26/2021   IR FLUORO GUIDE CV LINE LEFT  11/28/2021   IR FLUORO GUIDE CV LINE LEFT  02/04/2022   IR FLUORO GUIDE CV LINE LEFT  03/07/2022   IR FLUORO GUIDE CV LINE RIGHT  08/28/2020   IR FLUORO GUIDE CV LINE RIGHT  05/13/2021   IR FLUORO GUIDE CV LINE RIGHT  05/24/2021   IR INTRAVASCULAR ULTRASOUND NON CORONARY  03/07/2022   IR PERC TUN PERIT CATH WO PORT S&I /IMAG  09/06/2020   IR PTA VENOUS EXCEPT DIALYSIS CIRCUIT  02/20/2021   IR PTA VENOUS  EXCEPT DIALYSIS CIRCUIT  05/13/2021   IR PTA VENOUS EXCEPT DIALYSIS CIRCUIT  11/28/2021   IR PTA VENOUS EXCEPT DIALYSIS CIRCUIT  03/07/2022   IR REMOVAL TUN CV CATH W/O FL  05/24/2021   IR REMOVAL TUN CV CATH W/O FL  10/15/2021   IR US  GUIDE VASC ACCESS LEFT  09/06/2020   IR US  GUIDE VASC ACCESS LEFT  09/11/2021   IR US  GUIDE VASC ACCESS LEFT  10/18/2021   IR US  GUIDE VASC ACCESS RIGHT  05/24/2021   IR US  GUIDE VASC ACCESS RIGHT  09/10/2021   IR VENO/JUGULAR RIGHT  09/10/2021   IR VENOCAVAGRAM SVC  02/20/2021   IR VENOCAVAGRAM SVC  11/26/2021   IR VENOCAVAGRAM SVC  03/07/2022   TEE WITHOUT CARDIOVERSION N/A 09/27/2021   Procedure: TRANSESOPHAGEAL ECHOCARDIOGRAM (TEE);  Surgeon: Pietro Redell RAMAN, MD;  Location: [facility] ENDOSCOPY;  Service: Cardiovascular;  Laterality: N/A;   TEE WITHOUT CARDIOVERSION N/A 11/11/2022   Procedure: TRANSESOPHAGEAL ECHOCARDIOGRAM;  Surgeon: Shlomo Wilbert SAUNDERS, MD;  Location: [facility] INVASIVE CV LAB;  Service: Cardiovascular;  Laterality: N/A;   UPPER EXTREMITY VENOGRAPHY Right 12/04/2021   Procedure: UPPER EXTREMITY VENOGRAPHY;  Surgeon: Lanis Fonda BRAVO, MD;  Location: [facility] INVASIVE CV LAB;  Service: Cardiovascular;  Laterality: Right;     A IV Location/Drains/Wounds Patient Lines/Drains/Airways Status     Active Line/Drains/Airways     Name Placement date Placement time Site Days   Peripheral IV 04/07/23 20 G 1 Left Antecubital 04/07/23  0343  Antecubital  less than 1   Fistula / Graft Right Upper arm Arteriovenous fistula 11/28/19  --  Upper arm  1226   Hemodialysis Catheter Left Internal jugular Double lumen Permanent (Tunneled) 03/07/22  1105  Internal jugular  396   Pressure Injury 05/26/22 Ischial tuberosity Right Stage 4 - Full thickness tissue loss with exposed bone, tendon or muscle. 4 cms x 2 cms x 5 cms 05/26/22  0800  -- 316   Pressure Injury 11/06/22 Ischial tuberosity Right Stage 4 - Full thickness tissue loss with exposed bone, tendon or muscle. 4 CM X 2 CM X 4 CM 100% pink  moist 11/06/22  0005  -- 152   Pressure Injury 03/17/23 Thigh Posterior;Proximal;Right Stage 4 - Full thickness tissue loss with exposed bone, tendon or muscle. 03/17/23  2000  -- 21   Wound / Incision (Open or Dehisced) 05/26/22 Non-pressure wound Abdomen Left;Lower 4 cms x 3 cms x 0.8 cms full thickness 05/26/22  0800  Abdomen  316   Wound / Incision (Open or Dehisced) 05/26/22 Non-pressure wound Flank Lower;Right;Lateral 5 cms x 1 cms x 0.1 cms full thickness 05/26/22  0800  Flank  316   Wound / Incision (Open or Dehisced) 03/17/23 Irritant Dermatitis (Moisture Associated Skin Damage) Groin  Anterior;Right 03/17/23  2000  Groin  21   Wound / Incision (Open or Dehisced) 03/17/23 Non-pressure wound Flank Left 03/17/23  2000  Flank  21   Wound / Incision (Open or Dehisced) 03/17/23 Non-pressure wound Flank Right 03/17/23  2000  Flank  21   Wound / Incision (Open or Dehisced) 03/17/23 Non-pressure wound Abdomen Anterior;Left 03/17/23  2000  Abdomen  21            Intake/Output Last 24 hours No intake or output data in the 24 hours ending 04/07/23 9287  Labs/Imaging Results for orders placed or performed during the hospital encounter of 04/07/23 (from the past 48 hours)  CBC with Differential     Status: Abnormal   Collection Time: 04/07/23  3:13 AM  Result Value Ref Range   WBC 8.0 4.0 - 10.5 K/uL   RBC 3.69 (L) 3.87 - 5.11 MIL/uL   Hemoglobin 11.7 (L) 12.0 - 15.0 g/dL   HCT 64.1 (L) 63.9 - 53.9 %   MCV 97.0 80.0 - 100.0 fL   MCH 31.7 26.0 - 34.0 pg   MCHC 32.7 30.0 - 36.0 g/dL   RDW 77.6 (H) 88.4 - 84.4 %   Platelets 74 (L) 150 - 400 K/uL    Comment: Immature Platelet Fraction may be clinically indicated, consider ordering this additional test [id number] REPEATED TO VERIFY PLATELET COUNT CONFIRMED BY SMEAR    nRBC 0.4 (H) 0.0 - 0.2 %   Neutrophils Relative % 73 %   Neutro Abs 5.8 1.7 - 7.7 K/uL   Lymphocytes Relative 13 %   Lymphs Abs 1.1 0.7 - 4.0 K/uL   Monocytes Relative 8  %   Monocytes Absolute 0.7 0.1 - 1.0 K/uL   Eosinophils Relative 5 %   Eosinophils Absolute 0.4 0.0 - 0.5 K/uL   Basophils Relative 1 %   Basophils Absolute 0.1 0.0 - 0.1 K/uL   Immature Granulocytes 0 %   Abs Immature Granulocytes 0.02 0.00 - 0.07 K/uL    Comment: Performed at [facility], [address] [place], [place] [address]  Comprehensive metabolic panel     Status: Abnormal   Collection Time: 04/07/23  3:13 AM  Result Value Ref Range   Sodium 132 (L) 135 - 145 mmol/L   Potassium 6.0 (H) 3.5 - 5.1 mmol/L   Chloride 94 (L) 98 - 111 mmol/L   CO2 17 (L) 22 - 32 mmol/L   Glucose, Bld 69 (L) 70 - 99 mg/dL    Comment: Glucose reference range applies only to samples taken after fasting for at least 8 hours.   BUN 81 (H) 6 - 20 mg/dL   Creatinine, Ser 86.41 (H) 0.44 - 1.00 mg/dL    Comment: DELTA CHECK NOTED   Calcium  9.3 8.9 - 10.3 mg/dL   Total Protein 7.5 6.5 - 8.1 g/dL   Albumin  3.5 3.5 - 5.0 g/dL   AST 892 (H) 15 - 41 U/L   ALT 82 (H) 0 - 44 U/L   Alkaline Phosphatase 102 38 - 126 U/L   Total Bilirubin 2.0 (H) 0.0 - 1.2 mg/dL   GFR, Estimated 3 (L) >60 mL/min    Comment: (NOTE) Calculated using the CKD-EPI Creatinine Equation (2021)    Anion gap 21 (H) 5 - 15    Comment: ELECTROLYTES REPEATED TO VERIFY Performed at [facility], [address] [place], [place] [address]   I-Stat CG4 Lactic Acid     Status: Abnormal  Collection Time: 04/07/23  3:21 AM  Result Value Ref Range   Lactic Acid, Venous 2.4 (HH) 0.5 - 1.9 mmol/L   Comment NOTIFIED PHYSICIAN   I-stat chem 8, ED (not at [facility], [facility] or [facility])     Status: Abnormal   Collection Time: 04/07/23  3:21 AM  Result Value Ref Range   Sodium 131 (L) 135 - 145 mmol/L   Potassium 5.8 (H) 3.5 - 5.1 mmol/L   Chloride 100 98 - 111 mmol/L   BUN 71 (H) 6 - 20 mg/dL   Creatinine, Ser 86.09 (H) 0.44 - 1.00 mg/dL   Glucose, Bld 67 (L) 70 - 99 mg/dL    Comment: Glucose reference range applies only to samples taken  after fasting for at least 8 hours.   Calcium , Ion 0.95 (L) 1.15 - 1.40 mmol/L   TCO2 19 (L) 22 - 32 mmol/L   Hemoglobin 14.3 12.0 - 15.0 g/dL   HCT 57.9 63.9 - 53.9 %  Resp panel by RT-PCR (RSV, Flu A&B, Covid) Anterior Nasal Swab     Status: None   Collection Time: 04/07/23  4:07 AM   Specimen: Anterior Nasal Swab  Result Value Ref Range   SARS Coronavirus 2 by RT PCR NEGATIVE NEGATIVE   Influenza A by PCR NEGATIVE NEGATIVE   Influenza B by PCR NEGATIVE NEGATIVE    Comment: (NOTE) The Xpert Xpress SARS-CoV-2/FLU/RSV plus assay is intended as an aid in the diagnosis of influenza from Nasopharyngeal swab specimens and should not be used as a sole basis for treatment. Nasal washings and aspirates are unacceptable for Xpert Xpress SARS-CoV-2/FLU/RSV testing.  Fact Sheet for Patients: bloggercourse.com  Fact Sheet for Healthcare Providers: seriousbroker.it  This test is not yet approved or cleared by the [place]  FDA and has been authorized for detection and/or diagnosis of SARS-CoV-2 by FDA under an Emergency Use Authorization (EUA). This EUA will remain in effect (meaning this test can be used) for the duration of the COVID-19 declaration under Section 564(b)(1) of the Act, 21 U.S.C. section 360bbb-3(b)(1), unless the authorization is terminated or revoked.     Resp Syncytial Virus by PCR NEGATIVE NEGATIVE    Comment: (NOTE) Fact Sheet for Patients: bloggercourse.com  Fact Sheet for Healthcare Providers: seriousbroker.it  This test is not yet approved or cleared by the [place]  FDA and has been authorized for detection and/or diagnosis of SARS-CoV-2 by FDA under an Emergency Use Authorization (EUA). This EUA will remain in effect (meaning this test can be used) for the duration of the COVID-19 declaration under Section 564(b)(1) of the Act, 21 U.S.C. section  360bbb-3(b)(1), unless the authorization is terminated or revoked.  Performed at [facility], [address] [place], [place] [address]   POC CBG, ED     Status: None   Collection Time: 04/07/23  4:18 AM  Result Value Ref Range   Glucose-Capillary 78 70 - 99 mg/dL    Comment: Glucose reference range applies only to samples taken after fasting for at least 8 hours.  I-Stat CG4 Lactic Acid     Status: Abnormal   Collection Time: 04/07/23  5:22 AM  Result Value Ref Range   Lactic Acid, Venous 2.6 (HH) 0.5 - 1.9 mmol/L   Comment NOTIFIED PHYSICIAN    CT HEAD WO CONTRAST ( ) Result Date: 04/07/2023 CLINICAL DATA:  Delirium EXAM: CT HEAD WITHOUT CONTRAST TECHNIQUE: Contiguous axial images were obtained from the base of the skull through the vertex without intravenous contrast. RADIATION  DOSE REDUCTION: This exam was performed according to the departmental dose-optimization program which includes automated exposure control, adjustment of the mA and/or kV according to patient size and/or use of iterative reconstruction technique. COMPARISON:  03/16/2023 FINDINGS: Brain: No evidence of acute infarction, hemorrhage, hydrocephalus, extra-axial collection or mass lesion/mass effect. Calcification along the right sylvian fissure that was also seen on prior and is nonspecific in isolation. No associated infarct to imply arterial embolic disease. Vascular: No hyperdense vessel. Extensive arterial calcification in the skull base and scalp. Skull: No acute or aggressive finding. Sinuses/Orbits: No emergent finding IMPRESSION: No acute or interval finding. Electronically Signed   By: Dorn Roulette M.D.   On: 04/07/2023 04:45   DG Chest Port 1 View Result Date: 04/07/2023 CLINICAL DATA:  Altered mental status.  Shortness of breath. EXAM: PORTABLE CHEST 1 VIEW COMPARISON:  Radiograph 03/16/2023 FINDINGS: Left-sided dialysis catheter unchanged in position. Cardiomegaly is unchanged. Stable mediastinal  contours. Worsening hazy perihilar opacities suspicious for edema. Possible pleural effusions, not well assessed due to habitus. No pneumothorax. IMPRESSION: Cardiomegaly with worsening hazy perihilar opacities suspicious for edema. Possible pleural effusions. Electronically Signed   By: Andrea Gasman M.D.   On: 04/07/2023 04:25    Pending Labs Unresulted Labs (From admission, onward)     Start     Ordered   04/07/23 0441  Hepatitis B surface antigen  (New Admission Hemo Labs (Hepatitis B))  Once,   URGENT        04/07/23 0441   04/07/23 0441  Hepatitis B surface antibody,quantitative  (New Admission Hemo Labs (Hepatitis B))  Once,   URGENT        04/07/23 0441   04/07/23 0158  Blood culture (routine x 2)  BLOOD CULTURE X 2,   R (with STAT occurrences)      04/07/23 0200            Vitals/Pain Today's Vitals   04/07/23 0415 04/07/23 0445 04/07/23 0639 04/07/23 0700  BP: (!) 121/94 97/76  98/77  Pulse:  78  90  Resp: 11 14  15   Temp:   97.7 F (36.5 C)   TempSrc:   Oral   SpO2:  100%  95%  PainSc:  8       Isolation Precautions No active isolations  Medications Medications  calcium  gluconate inj 10% (1 g) URGENT USE ONLY! (1 g Intravenous Patient Refused/Not Given 04/07/23 0501)    Mobility non-ambulatory     Focused Assessments Renal Assessment Handoff:  Hemodialysis Schedule: Hemodialysis Schedule: Monday/Wednesday/Friday Last Hemodialysis date and time: 04/01/23   Restricted appendage: right arm   R Recommendations: See Admitting Provider Note  Report given to:   Additional Notes: Report given to HD already for patient.

## 2023-04-07 NOTE — H&P (Signed)
 History and Physical    Patient: Courtney Gray FMW:[id number] DOB: [birth year] DOA: 04/07/2023 DOS: the patient was seen and examined on 04/07/2023 PCP: Default, Provider, MD  Patient coming from: [facility] via EMS  Chief Complaint:  Chief Complaint  Patient presents with   Altered Mental Status   Hypotension   HPI: Courtney Gray is a 51 y.o. female with medical history significant of HTN, ESRD on MWF, HFpEF, NSTEMI, DM type II, PAD s/p BL AKA , Chronic hypoxic respiratory failure, hypothyroidism, and obesity who presented after being noted to be acutely altered this morning.  Patient is not able to contribute to history as she reports that she cannot remember.  Patient does report complaints of back pain.  History is mostly obtained from review of records and talks with her daughter over the phone.  Recent hospitalized admission from 12/9-12/18 with acute metabolic encephalopathy secondary to hypoglycemia and polypharmacy.  During hospitalization patient was also noted to have concern for community acquired pneumonia completing course of antibiotics.  At discharge patient was recommended not to resume Flexeril , gabapentin , or Lyrica  due to heavy sedation.   Over the phone her daughter notes that after being discharged back to the skilled nursing facility she had been alert and oriented and able to carry on a normal conversation the first couple days.  She makes note that they had not been checking her blood sugars as she was not on insulin .  However, over the last 3 to 4 days patient had been more lethargic with decreased p.o. intake.  Her last hemodialysis was noted to be on 12/24.  She had missed 1 day due to no one being present to get her ready on Sunday on the holiday schedule.  In the emergency department patient was noted to be afebrile with tachypnea, and initial hypotension with maps noted to be less than 65.  Labs significant for hemoglobin 11.7, hemoglobin 11.7, platelets 74,  sodium 132, potassium 6, CO2 17, BUN 81, creatinine 13.58, and glucose 81.  Chest x-ray noted cardiomegaly with worsening hazy perihilar opacities suspicious for edema.  CT of her head did not note any like acute abnormality.  Nephrology had been consulted for need of hemodialysis.  Patient had been given calcium  gluconate 1 g IV, albumin  25 g, and midodrine  10 mg p.o.  Review of Systems: unable to review all systems due to the inability of the patient to answer questions. Past Medical History:  Diagnosis Date   Arthritis    Bacteremia    Calciphylaxis    Chronic HFrEF (heart failure with reduced ejection fraction) (HCC)    Diabetes mellitus without complication (HCC)    Dialysis complication, subsequent encounter    ESRD (end stage renal disease) (HCC)    Gout    High cholesterol    Hypertension    Hypertensive emergency    Ischemic foot pain at rest 08/03/2021   Renal disorder    Right atrial mass    Severe mitral regurgitation    Past Surgical History:  Procedure Laterality Date   ABDOMINAL AORTOGRAM W/LOWER EXTREMITY N/A 08/07/2021   Procedure: ABDOMINAL AORTOGRAM W/LOWER EXTREMITY;  Surgeon: Lanis Fonda BRAVO, MD;  Location: [facility] INVASIVE CV LAB;  Service: Cardiovascular;  Laterality: N/A;   AMPUTATION Right 08/14/2021   Procedure: AMPUTATION ABOVE KNEE;  Surgeon: Gretta Lonni PARAS, MD;  Location: [facility] OR;  Service: Vascular;  Laterality: Right;   AMPUTATION Left 09/23/2021   Procedure: AMPUTATION MIDDLE FINGER;  Surgeon: Romona Harari, MD;  Location: [facility]  OR;  Service: Orthopedics;  Laterality: Left;   AMPUTATION Left 10/09/2021   Procedure: LEFT ABOVE KNEE AMPUTATION;  Surgeon: Magda Debby SAILOR, MD;  Location: [facility] OR;  Service: Vascular;  Laterality: Left;   AV FISTULA PLACEMENT     x2, unsuccessful.    BUBBLE STUDY  09/27/2021   Procedure: BUBBLE STUDY;  Surgeon: Pietro Redell RAMAN, MD;  Location: [facility] ENDOSCOPY;  Service: Cardiovascular;;   IR FLUORO GUIDE CV LINE LEFT  12/14/2020    IR FLUORO GUIDE CV LINE LEFT  02/20/2021   IR FLUORO GUIDE CV LINE LEFT  09/11/2021   IR FLUORO GUIDE CV LINE LEFT  10/18/2021   IR FLUORO GUIDE CV LINE LEFT  11/26/2021   IR FLUORO GUIDE CV LINE LEFT  11/28/2021   IR FLUORO GUIDE CV LINE LEFT  02/04/2022   IR FLUORO GUIDE CV LINE LEFT  03/07/2022   IR FLUORO GUIDE CV LINE RIGHT  08/28/2020   IR FLUORO GUIDE CV LINE RIGHT  05/13/2021   IR FLUORO GUIDE CV LINE RIGHT  05/24/2021   IR INTRAVASCULAR ULTRASOUND NON CORONARY  03/07/2022   IR PERC TUN PERIT CATH WO PORT S&I /IMAG  09/06/2020   IR PTA VENOUS EXCEPT DIALYSIS CIRCUIT  02/20/2021   IR PTA VENOUS EXCEPT DIALYSIS CIRCUIT  05/13/2021   IR PTA VENOUS EXCEPT DIALYSIS CIRCUIT  11/28/2021   IR PTA VENOUS EXCEPT DIALYSIS CIRCUIT  03/07/2022   IR REMOVAL TUN CV CATH W/O FL  05/24/2021   IR REMOVAL TUN CV CATH W/O FL  10/15/2021   IR US  GUIDE VASC ACCESS LEFT  09/06/2020   IR US  GUIDE VASC ACCESS LEFT  09/11/2021   IR US  GUIDE VASC ACCESS LEFT  10/18/2021   IR US  GUIDE VASC ACCESS RIGHT  05/24/2021   IR US  GUIDE VASC ACCESS RIGHT  09/10/2021   IR VENO/JUGULAR RIGHT  09/10/2021   IR VENOCAVAGRAM SVC  02/20/2021   IR VENOCAVAGRAM SVC  11/26/2021   IR VENOCAVAGRAM SVC  03/07/2022   TEE WITHOUT CARDIOVERSION N/A 09/27/2021   Procedure: TRANSESOPHAGEAL ECHOCARDIOGRAM (TEE);  Surgeon: Pietro Redell RAMAN, MD;  Location: [facility] ENDOSCOPY;  Service: Cardiovascular;  Laterality: N/A;   TEE WITHOUT CARDIOVERSION N/A 11/11/2022   Procedure: TRANSESOPHAGEAL ECHOCARDIOGRAM;  Surgeon: Shlomo Wilbert SAUNDERS, MD;  Location: [facility] INVASIVE CV LAB;  Service: Cardiovascular;  Laterality: N/A;   UPPER EXTREMITY VENOGRAPHY Right 12/04/2021   Procedure: UPPER EXTREMITY VENOGRAPHY;  Surgeon: Lanis Fonda BRAVO, MD;  Location: [facility] INVASIVE CV LAB;  Service: Cardiovascular;  Laterality: Right;   Social History:  reports that she has never smoked. She has never used smokeless tobacco. She reports that she does not drink alcohol  and does not use drugs.  Allergies   Allergen Reactions   Benzonatate Other (See Comments)    Hallucinations   Hydralazine  Other (See Comments)    Hallucinations   Amoxicillin Itching and Nausea And Vomiting   Clonidine Other (See Comments)    Altered mental state, insomnia    Chlorhexidine  Itching    Patient reports it is the CHG baths that cause her itching not the CHG used for caring for her IV/HD access.     Morphine  Itching    Family History  Problem Relation Age of Onset   Diabetes Mother    Diabetes Father     Prior to Admission medications   Medication Sig Start Date End Date Taking? Authorizing Provider  acetaminophen  (TYLENOL ) 325 MG tablet Take 2 tablets (650 mg total) by mouth every  6 (six) hours as needed for mild pain (pain score 1-3) (or Fever >/= 101). 03/25/23   Danton Reyes DASEN, MD  aspirin  EC 81 MG tablet Take 1 tablet (81 mg total) by mouth daily. Swallow whole. 05/16/22   [place], Camellia PARAS, DO  atorvastatin  (LIPITOR) 10 MG tablet Take 1 tablet (10 mg total) by mouth daily. 11/28/20   Pearlean Manus, MD  Darbepoetin Alfa  (ARANESP ) 200 MCG/0.4ML SOSY injection Inject 200 mcg into the skin once a week. Every Tuesday    [provider]  dicyclomine  (BENTYL ) 10 MG capsule Take 10 mg by mouth in the morning, at noon, and at bedtime. 11/04/22   [provider]  docusate sodium  (COLACE) 100 MG capsule Take 100 mg by mouth 2 (two) times daily.    [provider]  famotidine  (PEPCID ) 20 MG tablet Take 1 tablet (20 mg total) by mouth daily. 05/16/22   [place], Camellia PARAS, DO  guaiFENesin  (ROBITUSSIN) 100 MG/5ML liquid Take 100 mg by mouth every 4 (four) hours as needed for cough.    [provider]  levothyroxine  (SYNTHROID ) 100 MCG tablet Take 100 mcg by mouth daily.    [provider]  lidocaine  (LIDODERM ) 5 % Place 1 patch onto the skin daily. Remove & Discard patch within 12 hours or as directed by MD    [provider]  lidocaine  (XYLOCAINE ) 5 % ointment Apply  1 Application topically See admin instructions. 1 application every 2 hours as needed for pain    [provider]  midodrine  (PROAMATINE ) 10 MG tablet Take 1 tablet (10 mg total) by mouth every Monday, Wednesday, and Friday at 8 PM. Before hemodialysis Patient taking differently: Take 10 mg by mouth See admin instructions. Take 10 mg by mouth every Monday, Wednesday, and Friday before hemodialysis 05/16/22   [place], Camellia PARAS, DO  multivitamin (RENA-VIT) TABS tablet Take 1 tablet by mouth at bedtime. 03/25/23   Danton Reyes DASEN, MD  nutrition supplement, JUVEN, (JUVEN) PACK Take 1 packet by mouth 2 (two) times daily between meals.    [provider]  OXYGEN  Inhale 4 L into the lungs See admin instructions. Oxygen  at 4L hr via nasal cannula and will use portable oxygen  machine when up in wheelchair. 10/28/21   [provider]  pantoprazole  (PROTONIX ) 40 MG tablet Take 1 tablet (40 mg total) by mouth daily. 11/29/20   Pearlean Manus, MD  Patiromer  Sorbitex Calcium  (VELTASSA ) 16.8 g PACK Take 16.8 g by mouth every Monday, Wednesday, and Friday. 03/23/23   Christobal Gu[place], MD  senna (SENOKOT) 8.6 MG TABS tablet Take 2 tablets (17.2 mg total) by mouth daily. 11/13/22   Francella Rogue, MD  simethicone  [facility]) 80 MG chewable tablet Chew 80 mg by mouth every 6 (six) hours as needed for flatulence.    [provider]  sodium chloride  0.9 % SOLN 100 mL with sodium thiosulfate  250 MG/ML SOLN 25 g Inject 25 g into the vein every Monday, Wednesday, and Friday with hemodialysis. 03/25/23   Danton Reyes DASEN, MD  sucroferric oxyhydroxide (VELPHORO ) 500 MG chewable tablet Chew 500 mg by mouth 3 (three) times daily with meals.    [provider]    Physical Exam: Vitals:   04/07/23 0639 04/07/23 0700 04/07/23 0730 04/07/23 0743  BP:  98/77 99/61 99/61   Pulse:  90  79  Resp:  15 19 12   Temp: 97.7 F (36.5 C)     TempSrc: Oral     SpO2:  95%  99%   Constitutional: Elderly  female who appears to be chronically ill Eyes: PERRL, lids and conjunctivae normal ENMT: Mucous membranes are moist.  Poor dentition with missing teeth. Neck: normal, supple  Respiratory: Decreased overall aeration with crackles appreciated in the mid to lower lung fields.  O2 saturations currently maintained on 2 L nasal cannula oxygen .. Cardiovascular: Regular rate and rhythm with positive systolic murmur appreciated.  Hemodialysis catheter noted of the left chest wall Abdomen: Protuberant abdomen.  No tenderness, no masses palpated. Bowel sounds positive.  Musculoskeletal: no clubbing / cyanosis.  Bilateral AKA, right middle finger amputation Skin: Necrotic appearance of the right index finger. Neurologic: CN 2-12 grossly intact. Sensation intact, DTR normal. Strength 5/5 in all 4.  Psychiatric: Lethargic only alert and oriented to self at this time.  Data Reviewed:  EKG reveals sinus rhythm at 79 bpm with first-degree heart block and premature atrial complexes.  Reviewed labs, imaging, and pertinent records as documented.  Assessment and Plan:  Fluid overload ESRD on HD Acute on chronic combined systolic and diastolic CHF Hyperkalemia Patient normally dialyzes Monday, Wednesday, Friday.  Last dialysis dialysis session was noted to be on 12/24.  Labs significant for potassium 6, creatinine 81, and creatinine 13.58.  Chest x-ray noted cardiomegaly with worsening hazy perihilar opacity suspicious for edema.  Nephrology have been consulted for need of dialysis.  Patient was taken to dialysis on 12/31. -Admit to a progressive bed -Renal diet with fluid restriction -Continue current medication regimen -Appreciate nephrology consultative services, we will follow-up for any further recommendations  Acute metabolic encephalopathy Patient noted to be acutely altered and appears only oriented to self.  CT scan of the head negative for any acute abnormality.  Patient had recently missed  hemodialysis.  At the facility that had not been routinely checking her blood sugars.  Repeat blood sugar checks noted to be as low as 49 since being here at the hospital.  Blood pressures were also noted to be low.  No clear source of infection noted at this time.  Patient was noted to be altered similarly during most recent hospitalization, but patient not currently on significant sedating medications. -Neurochecks -May warrant further investigation if symptoms persist after correction of electrolytes and low blood pressures.  Hypoglycemia History of diabetes mellitus type 2 Acute.  Blood sugars after dialysis noted to be as low as 49.  Patient has a history of diabetes mellitus type 2, but last Hemoglobin A1c noted to be 12.9 when checked on 12/9. -Hypoglycemic protocols -D50 as needed for low blood sugars -CBGs before every meal and at bedtime.  This likely needs to be recommended to be continued at discharge. -May warrant further investigation of cause of hypoglycemia  Lactic acidosis Acute.  Lactic acid elevated up to 2.6.  Blood cultures have been obtained.  Thought possibly secondary to hypoperfusion in the setting of low blood pressures.   -Follow-up blood cultures  Hypotension Acute on chronic.  Initial blood pressures reported to be as low as 66/56.  Normal regimen includes midodrine . -Continue midodrine  10 mg p.o. 3 times daily.  Chronic respiratory failure with hypoxia Patient is on 2 L of nasal cannula chronically. -Continue nasal cannula oxygen  at 2 L to maintain O2 saturation greater than 90%.  Metabolic acidosis with elevated anion gap Acute on chronic.  On admission CO2 17 and anion gap 21.  Possibly secondary to patient not recently being dialyzed and/or lactic acidosis. -Continue monitor after hemodialysis  Pancytopenia Acute.  Hemoglobin 11.7  which appears similar to priors and platelet count acutely low at 74. -Recheck CBC tomorrow  Fingertip ischemia/dry  gangrene Patient noted to have right index fingertip with ulceration present with darkening appearance.  Symptoms thought secondary to compromised blood flow.  Vascular surgery had been consulted during prior hospitalization with recommended possible angiography and intervention, but patient declined. -Continue to reassess and determine  Elevated liver enzymes Acute.  AST 107 and ALT 82.  Also secondary to passive congestion in the setting of patient being fluid overloaded. -Recheck LFTs in a.m.  Hypothyroidism TSH was noted to be 1.93 when checked on 12/9. -Continue levothyroxine   PAD S/p bilateral AKA  Thrombosis of tridialysis catheter Chronic.  CT scan from 12/10 noted4.8 x 3 3.7 cm mass with patchy calcifications in the right atrium stable since June 2023 and consistent with a dialysis catheter related calcified thrombus, as well as a calcified linear thrombus extending along the course of the hemodialysis catheter in the SVC.  Nephrology following in regards to this.  History of calciphylaxis of abdominal pannus Patient has abdominal wound that is healing.  Avoid calcium  administration.  -Continue sodium thiosulfate  3 times weekly with dialysis -Wound care: Apply foam dressings to abdomen wounds   DVT prophylaxis: SCDs due to thrombocytopenia  Advance Care Planning:   Code Status: Full Code    Consults: Nephrology  Family Communication: Daughter updated over the phone  Severity of Illness: The appropriate patient status for this patient is INPATIENT. Inpatient status is judged to be reasonable and necessary in order to provide the required intensity of service to ensure the patient's safety. The patient's presenting symptoms, physical exam findings, and initial radiographic and laboratory data in the context of their chronic comorbidities is felt to place them at high risk for further clinical deterioration. Furthermore, it is not anticipated that the patient will be medically  stable for discharge from the hospital within 2 midnights of admission.   * I certify that at the point of admission it is my clinical judgment that the patient will require inpatient hospital care spanning beyond 2 midnights from the point of admission due to high intensity of service, high risk for further deterioration and high frequency of surveillance required.*  Author: Maximino DELENA Sharps, MD 04/07/2023 8:16 AM  For on call review [email or web address].

## 2023-04-07 NOTE — ED Provider Notes (Signed)
 [place] [facility] Provider Note   CSN: [id number] Arrival date & time: 04/07/23  9861     History  Chief Complaint  Patient presents with   Altered Mental Status   Hypotension    Courtney Gray is a 52 y.o. female.  The history is provided by the patient and medical records.  Altered Mental Status  52 year old female with history of hypothyroidism, CHF, ESRD on HD (last session Wed 12/25), hypertension, obesity, hyperlipidemia, presenting to the ED for altered mental status.  Family at bedside reports she was recently admitted 2 weeks ago for polypharmacy and suspected pneumonia.  She completed course of antibiotics for this.  Over the past 24 hours she has been slow to respond, some words are garbled and not making sense, falling asleep quickly, etc.  She is on chronic O2, 3L at baseline.  She denies fevers.  No illness outbreaks at facility per family.  Home Medications Prior to Admission medications   Medication Sig Start Date End Date Taking? Authorizing Provider  acetaminophen  (TYLENOL ) 325 MG tablet Take 2 tablets (650 mg total) by mouth every 6 (six) hours as needed for mild pain (pain score 1-3) (or Fever >/= 101). 03/25/23   Danton Reyes DASEN, MD  aspirin  EC 81 MG tablet Take 1 tablet (81 mg total) by mouth daily. Swallow whole. 05/16/22   [place], Camellia PARAS, DO  atorvastatin  (LIPITOR) 10 MG tablet Take 1 tablet (10 mg total) by mouth daily. 11/28/20   Pearlean Manus, MD  Darbepoetin Alfa  (ARANESP ) 200 MCG/0.4ML SOSY injection Inject 200 mcg into the skin once a week. Every Tuesday    [provider]  dicyclomine  (BENTYL ) 10 MG capsule Take 10 mg by mouth in the morning, at noon, and at bedtime. 11/04/22   [provider]  docusate sodium  (COLACE) 100 MG capsule Take 100 mg by mouth 2 (two) times daily.    [provider]  famotidine  (PEPCID ) 20 MG tablet Take 1 tablet (20 mg total) by mouth daily. 05/16/22    [place], Camellia PARAS, DO  guaiFENesin  (ROBITUSSIN) 100 MG/5ML liquid Take 100 mg by mouth every 4 (four) hours as needed for cough.    [provider]  levothyroxine  (SYNTHROID ) 100 MCG tablet Take 100 mcg by mouth daily.    [provider]  lidocaine  (LIDODERM ) 5 % Place 1 patch onto the skin daily. Remove & Discard patch within 12 hours or as directed by MD    [provider]  lidocaine  (XYLOCAINE ) 5 % ointment Apply 1 Application topically See admin instructions. 1 application every 2 hours as needed for pain    [provider]  midodrine  (PROAMATINE ) 10 MG tablet Take 1 tablet (10 mg total) by mouth every Monday, Wednesday, and Friday at 8 PM. Before hemodialysis Patient taking differently: Take 10 mg by mouth See admin instructions. Take 10 mg by mouth every Monday, Wednesday, and Friday before hemodialysis 05/16/22   [place], Camellia PARAS, DO  multivitamin (RENA-VIT) TABS tablet Take 1 tablet by mouth at bedtime. 03/25/23   Danton Reyes DASEN, MD  nutrition supplement, JUVEN, (JUVEN) PACK Take 1 packet by mouth 2 (two) times daily between meals.    [provider]  OXYGEN  Inhale 4 L into the lungs See admin instructions. Oxygen  at 4L hr via nasal cannula and will use portable oxygen  machine when up in wheelchair. 10/28/21   [provider]  pantoprazole  (PROTONIX ) 40 MG tablet Take 1 tablet (40 mg total)  by mouth daily. 11/29/20   Pearlean Manus, MD  Patiromer  Sorbitex Calcium  (VELTASSA ) 16.8 g PACK Take 16.8 g by mouth every Monday, Wednesday, and Friday. 03/23/23   Christobal Guadal[place], MD  senna (SENOKOT) 8.6 MG TABS tablet Take 2 tablets (17.2 mg total) by mouth daily. 11/13/22   Francella Rogue, MD  simethicone  [facility]) 80 MG chewable tablet Chew 80 mg by mouth every 6 (six) hours as needed for flatulence.    [provider]  sodium chloride  0.9 % SOLN 100 mL with sodium thiosulfate  250 MG/ML SOLN 25 g Inject 25 g into the vein every Monday, Wednesday, and  Friday with hemodialysis. 03/25/23   Danton Reyes DASEN, MD  sucroferric oxyhydroxide (VELPHORO ) 500 MG chewable tablet Chew 500 mg by mouth 3 (three) times daily with meals.    [provider]      Allergies    Benzonatate, Hydralazine , Amoxicillin, Clonidine, Chlorhexidine , and Morphine     Review of Systems   Review of Systems  Constitutional:  Positive for fatigue.  All other systems reviewed and are negative.   Physical Exam Updated Vital Signs BP (!) 66/56 (BP Location: Left Wrist)   Pulse 84   Temp 97.8 F (36.6 C) (Oral)   Resp (!) 27   Physical Exam Vitals and nursing note reviewed.  Constitutional:      Appearance: She is well-developed.  HENT:     Head: Normocephalic and atraumatic.     Comments: No visible head trauma Eyes:     Conjunctiva/sclera: Conjunctivae normal.     Pupils: Pupils are equal, round, and reactive to light.  Cardiovascular:     Rate and Rhythm: Normal rate and regular rhythm.     Heart sounds: Normal heart sounds.  Pulmonary:     Effort: Pulmonary effort is normal. No respiratory distress.     Breath sounds: Normal breath sounds. No rhonchi.     Comments: 3L O2 in use Abdominal:     General: Bowel sounds are normal.     Palpations: Abdomen is soft.  Musculoskeletal:        General: Normal range of motion.     Cervical back: Normal range of motion.     Comments: Bilateral AKA's  Skin:    General: Skin is warm and dry.  Neurological:     Mental Status: She is alert and oriented to person, place, and time.     Comments: Awake, alert, able to speak fairly clearly with me, does drift off to sleep when not engaged, moving arms without issue, legs absent     ED Results / Procedures / Treatments   Labs (all labs ordered are listed, but only abnormal results are displayed) Labs Reviewed  CBC WITH DIFFERENTIAL/PLATELET - Abnormal; Notable for the following components:      Result Value   RBC 3.69 (*)    Hemoglobin 11.7 (*)     HCT 35.8 (*)    RDW 22.3 (*)    Platelets 74 (*)    nRBC 0.4 (*)    All other components within normal limits  COMPREHENSIVE METABOLIC PANEL - Abnormal; Notable for the following components:   Sodium 132 (*)    Potassium 6.0 (*)    Chloride 94 (*)    CO2 17 (*)    Glucose, Bld 69 (*)    BUN 81 (*)    Creatinine, Ser 13.58 (*)    AST 107 (*)    ALT 82 (*)    Total Bilirubin 2.0 (*)  GFR, Estimated 3 (*)    Anion gap 21 (*)    All other components within normal limits  I-STAT CG4 LACTIC ACID, ED - Abnormal; Notable for the following components:   Lactic Acid, Venous 2.4 (*)    All other components within normal limits  I-STAT CHEM 8, ED - Abnormal; Notable for the following components:   Sodium 131 (*)    Potassium 5.8 (*)    BUN 71 (*)    Creatinine, Ser 13.90 (*)    Glucose, Bld 67 (*)    Calcium , Ion 0.95 (*)    TCO2 19 (*)    All other components within normal limits  I-STAT CG4 LACTIC ACID, ED - Abnormal; Notable for the following components:   Lactic Acid, Venous 2.6 (*)    All other components within normal limits  RESP PANEL BY RT-PCR (RSV, FLU A&B, COVID)  RVPGX2  CULTURE, BLOOD (ROUTINE X 2)  CULTURE, BLOOD (ROUTINE X 2)  HEPATITIS B SURFACE ANTIGEN  HEPATITIS B SURFACE ANTIBODY, QUANTITATIVE  CBG MONITORING, ED  I-STAT ARTERIAL BLOOD GAS, ED    EKG EKG Interpretation Date/Time:  Tuesday April 07 2023 04:14:49 EST Ventricular Rate:  79 PR Interval:  205 QRS Duration:  147 QT Interval:  408 QTC Calculation: 468 R Axis:   123  Text Interpretation: Sinus rhythm Atrial premature complex Borderline prolonged PR interval Nonspecific intraventricular conduction delay Abnormal T, consider ischemia, lateral leads Confirmed by Bari Pfeiffer ([id number]) on 04/07/2023 4:16:33 AM  Radiology CT HEAD WO CONTRAST ( ) Result Date: 04/07/2023 CLINICAL DATA:  Delirium EXAM: CT HEAD WITHOUT CONTRAST TECHNIQUE: Contiguous axial images were obtained from the base of  the skull through the vertex without intravenous contrast. RADIATION DOSE REDUCTION: This exam was performed according to the departmental dose-optimization program which includes automated exposure control, adjustment of the mA and/or kV according to patient size and/or use of iterative reconstruction technique. COMPARISON:  03/16/2023 FINDINGS: Brain: No evidence of acute infarction, hemorrhage, hydrocephalus, extra-axial collection or mass lesion/mass effect. Calcification along the right sylvian fissure that was also seen on prior and is nonspecific in isolation. No associated infarct to imply arterial embolic disease. Vascular: No hyperdense vessel. Extensive arterial calcification in the skull base and scalp. Skull: No acute or aggressive finding. Sinuses/Orbits: No emergent finding IMPRESSION: No acute or interval finding. Electronically Signed   By: Dorn Roulette M.D.   On: 04/07/2023 04:45   DG Chest Port 1 View Result Date: 04/07/2023 CLINICAL DATA:  Altered mental status.  Shortness of breath. EXAM: PORTABLE CHEST 1 VIEW COMPARISON:  Radiograph 03/16/2023 FINDINGS: Left-sided dialysis catheter unchanged in position. Cardiomegaly is unchanged. Stable mediastinal contours. Worsening hazy perihilar opacities suspicious for edema. Possible pleural effusions, not well assessed due to habitus. No pneumothorax. IMPRESSION: Cardiomegaly with worsening hazy perihilar opacities suspicious for edema. Possible pleural effusions. Electronically Signed   By: Andrea Gasman M.D.   On: 04/07/2023 04:25    Procedures Procedures     CRITICAL CARE Performed by: Olam CHRISTELLA Slocumb   Total critical care time: 45 minutes  Critical care time was exclusive of separately billable procedures and treating other patients.  Critical care was necessary to treat or prevent imminent or life-threatening deterioration.  Critical care was time spent personally by me on the following activities: development of  treatment plan with patient and/or surrogate as well as nursing, discussions with consultants, evaluation of patient's response to treatment, examination of patient, obtaining history from patient or surrogate, ordering and performing treatments  and interventions, ordering and review of laboratory studies, ordering and review of radiographic studies, pulse oximetry and re-evaluation of patient's condition.   Medications Ordered in ED Medications  calcium  gluconate inj 10% (1 g) URGENT USE ONLY! (1 g Intravenous Patient Refused/Not Given 04/07/23 0501)    ED Course/ Medical Decision Making/ A&P                                 Medical Decision Making Amount and/or Complexity of Data Reviewed Labs: ordered. Radiology: ordered and independent interpretation performed. ECG/medicine tests: ordered and independent interpretation performed.  Risk Prescription drug management. Decision regarding hospitalization.   52 year old female presenting to the ED with altered mental status.  Family at bedside providing additional history.  Family reports she has been having some garbled words and speaking some nonsense, falling asleep easily, etc.  It appears her dialysis schedule was altered due to the holiday, but also missed a session last week due to mixup with facility.  Last HD on Wednesday 12/25.  Recent admission for metabolic encephalopathy and polypharmacy.  I suspect this may be the case again as she is awake and alert when talking to me but does drift off to sleep rather quickly.  She does not have any focal deficits on my exam.  Will obtain labs, CT head, chest x-ray.  Labs as above--hyper-K at 6.0.  Fortunately, no EKG changes.  She does not have any leukocytosis.  Anemia is stable.  4:29 AM Spoke with nephrology, Dr. Melia-- recommended calcium  gluconate only for now, will arrange for HD this AM.  CT head is negative.  Chest x-ray with some signs of fluid overload.  Results discussed with  patient's daughter at bedside.  She will require admission.  Attempted to give calcium  gluconate as instructed by nephrology, however patient refused.  As she does not have any EKG changes and vitals are stable currently, will hold off on additional meds for now.  Spoke with Dr. Charlton-- will admit for ongoing care.  Final Clinical Impression(s) / ED Diagnoses Final diagnoses:  Hyperkalemia    Rx / DC Orders ED Discharge Orders     None         Jarold Olam HERO, PA-C 04/07/23 0541    Bari Charmaine FALCON, MD 04/09/23 [phone]

## 2023-04-07 NOTE — Consult Note (Signed)
 [facility] Renal Consultation Note    Indication for Consultation:  Management of ESRD/hemodialysis; anemia, hypertension/volume and secondary hyperparathyroidism  HPI: Courtney Gray is a 52 y.o. female  with a PMH significant for DM, HTN, COPD (on home oxygen ), PAD s/p bilateral AKA, noncompliance with HD, morbid obesity, and ESRD on HD at [facility] MWF who was brought to [facility] ED via EMS early this am.  Family reported that she was admitted 2 weeks ago with AMS due to polypharmacy and suspected pneumonia.  She completed her antibiotic course but over the past 24 hours was slow to respond and had garbled speech.  She was also falling asleep quickly and often.  In the ED, Temp 97.8, Bp 66/56, HR 84, RR 27.  Labs notable for Hgb 11.7, plt 74, Na 132, K 6, Cl 94, Co2 17, BUN 81, Cr 13.58, AST 107, ALT 82, t.bili 2, lactate 2.4.  Her last HD session was on 03/31/23 and signed off 40 minutes early.  We were consulted to provide dialysis during her admission.  Past Medical History:  Diagnosis Date   Arthritis    Bacteremia    Calciphylaxis    Chronic HFrEF (heart failure with reduced ejection fraction) (HCC)    Diabetes mellitus without complication (HCC)    Dialysis complication, subsequent encounter    ESRD (end stage renal disease) (HCC)    Gout    High cholesterol    Hypertension    Hypertensive emergency    Ischemic foot pain at rest 08/03/2021   Renal disorder    Right atrial mass    Severe mitral regurgitation    Past Surgical History:  Procedure Laterality Date   ABDOMINAL AORTOGRAM W/LOWER EXTREMITY N/A 08/07/2021   Procedure: ABDOMINAL AORTOGRAM W/LOWER EXTREMITY;  Surgeon: Lanis Fonda BRAVO, MD;  Location: [facility] INVASIVE CV LAB;  Service: Cardiovascular;  Laterality: N/A;   AMPUTATION Right 08/14/2021   Procedure: AMPUTATION ABOVE KNEE;  Surgeon: Gretta Lonni PARAS, MD;  Location: [facility] OR;  Service: Vascular;  Laterality: Right;   AMPUTATION Left 09/23/2021   Procedure:  AMPUTATION MIDDLE FINGER;  Surgeon: Romona Harari, MD;  Location: [facility] OR;  Service: Orthopedics;  Laterality: Left;   AMPUTATION Left 10/09/2021   Procedure: LEFT ABOVE KNEE AMPUTATION;  Surgeon: Magda Debby SAILOR, MD;  Location: [facility] OR;  Service: Vascular;  Laterality: Left;   AV FISTULA PLACEMENT     x2, unsuccessful.    BUBBLE STUDY  09/27/2021   Procedure: BUBBLE STUDY;  Surgeon: Pietro Redell RAMAN, MD;  Location: [facility] ENDOSCOPY;  Service: Cardiovascular;;   IR FLUORO GUIDE CV LINE LEFT  12/14/2020   IR FLUORO GUIDE CV LINE LEFT  02/20/2021   IR FLUORO GUIDE CV LINE LEFT  09/11/2021   IR FLUORO GUIDE CV LINE LEFT  10/18/2021   IR FLUORO GUIDE CV LINE LEFT  11/26/2021   IR FLUORO GUIDE CV LINE LEFT  11/28/2021   IR FLUORO GUIDE CV LINE LEFT  02/04/2022   IR FLUORO GUIDE CV LINE LEFT  03/07/2022   IR FLUORO GUIDE CV LINE RIGHT  08/28/2020   IR FLUORO GUIDE CV LINE RIGHT  05/13/2021   IR FLUORO GUIDE CV LINE RIGHT  05/24/2021   IR INTRAVASCULAR ULTRASOUND NON CORONARY  03/07/2022   IR PERC TUN PERIT CATH WO PORT S&I /IMAG  09/06/2020   IR PTA VENOUS EXCEPT DIALYSIS CIRCUIT  02/20/2021   IR PTA VENOUS EXCEPT DIALYSIS CIRCUIT  05/13/2021   IR PTA VENOUS EXCEPT DIALYSIS CIRCUIT  11/28/2021  IR PTA VENOUS EXCEPT DIALYSIS CIRCUIT  03/07/2022   IR REMOVAL TUN CV CATH W/O FL  05/24/2021   IR REMOVAL TUN CV CATH W/O FL  10/15/2021   IR US  GUIDE VASC ACCESS LEFT  09/06/2020   IR US  GUIDE VASC ACCESS LEFT  09/11/2021   IR US  GUIDE VASC ACCESS LEFT  10/18/2021   IR US  GUIDE VASC ACCESS RIGHT  05/24/2021   IR US  GUIDE VASC ACCESS RIGHT  09/10/2021   IR VENO/JUGULAR RIGHT  09/10/2021   IR VENOCAVAGRAM SVC  02/20/2021   IR VENOCAVAGRAM SVC  11/26/2021   IR VENOCAVAGRAM SVC  03/07/2022   TEE WITHOUT CARDIOVERSION N/A 09/27/2021   Procedure: TRANSESOPHAGEAL ECHOCARDIOGRAM (TEE);  Surgeon: Pietro Redell RAMAN, MD;  Location: [facility] ENDOSCOPY;  Service: Cardiovascular;  Laterality: N/A;   TEE WITHOUT CARDIOVERSION N/A 11/11/2022    Procedure: TRANSESOPHAGEAL ECHOCARDIOGRAM;  Surgeon: Shlomo Wilbert SAUNDERS, MD;  Location: [facility] INVASIVE CV LAB;  Service: Cardiovascular;  Laterality: N/A;   UPPER EXTREMITY VENOGRAPHY Right 12/04/2021   Procedure: UPPER EXTREMITY VENOGRAPHY;  Surgeon: Lanis Fonda BRAVO, MD;  Location: [facility] INVASIVE CV LAB;  Service: Cardiovascular;  Laterality: Right;   Family History:   Family History  Problem Relation Age of Onset   Diabetes Mother    Diabetes Father    Social History:  reports that she has never smoked. She has never used smokeless tobacco. She reports that she does not drink alcohol  and does not use drugs. Allergies  Allergen Reactions   Benzonatate Other (See Comments)    Hallucinations   Hydralazine  Other (See Comments)    Hallucinations   Amoxicillin Itching and Nausea And Vomiting   Clonidine Other (See Comments)    Altered mental state, insomnia    Chlorhexidine  Itching    Patient reports it is the CHG baths that cause her itching not the CHG used for caring for her IV/HD access.     Morphine  Itching   Prior to Admission medications   Medication Sig Start Date End Date Taking? Authorizing Provider  aspirin  EC 81 MG tablet Take 1 tablet (81 mg total) by mouth daily. Swallow whole. 05/16/22  Yes Austria, Eric J, DO  Darbepoetin Alfa  (ARANESP ) 200 MCG/0.4ML SOSY injection Inject 200 mcg into the skin once a week. Every Tuesday   Yes [provider]  dicyclomine  (BENTYL ) 10 MG capsule Take 10 mg by mouth 3 (three) times daily. 11/04/22  Yes [provider]  docusate sodium  (COLACE) 100 MG capsule Take 100 mg by mouth 2 (two) times daily.   Yes [provider]  famotidine  (PEPCID ) 20 MG tablet Take 1 tablet (20 mg total) by mouth daily. 05/16/22  Yes Austria, Eric J, DO  levothyroxine  (SYNTHROID ) 100 MCG tablet Take 100 mcg by mouth daily.   Yes [provider]  midodrine  (PROAMATINE ) 10 MG tablet Take 1 tablet (10 mg total) by mouth every Monday, Wednesday,  and Friday at 8 PM. Before hemodialysis 05/16/22  Yes Austria, Eric J, DO  pantoprazole  (PROTONIX ) 40 MG tablet Take 1 tablet (40 mg total) by mouth daily. 11/29/20  Yes Pearlean Manus, MD  Patiromer  Sorbitex Calcium  (VELTASSA ) 16.8 g PACK Take 16.8 g by mouth every Monday, Wednesday, and Friday. 03/23/23  Yes Christobal Guadalajara, MD  senna (SENOKOT) 8.6 MG TABS tablet Take 2 tablets (17.2 mg total) by mouth daily. 11/13/22  Yes Francella Redell, MD  acetaminophen  (TYLENOL ) 325 MG tablet Take 2 tablets (650 mg total) by mouth every 6 (six) hours as  needed for mild pain (pain score 1-3) (or Fever >/= 101). 03/25/23   Danton Reyes DASEN, MD  atorvastatin  (LIPITOR) 10 MG tablet Take 1 tablet (10 mg total) by mouth daily. Patient not taking: Reported on 04/07/2023 11/28/20   Pearlean Manus, MD  cephALEXin  (KEFLEX ) 500 MG capsule Take 500 mg by mouth every 8 (eight) hours. Patient not taking: Reported on 04/07/2023    [provider]  fentaNYL  (DURAGESIC ) 25 MCG/HR Place 1 patch onto the skin every 3 (three) days. Patient not taking: Reported on 04/07/2023    [provider]  gabapentin  (NEURONTIN ) 300 MG capsule Take 300 mg by mouth 3 (three) times daily.    [provider]  guaiFENesin  (ROBITUSSIN) 100 MG/5ML liquid Take 100 mg by mouth every 4 (four) hours as needed for cough.    [provider]  lidocaine  (LIDODERM ) 5 % Place 1 patch onto the skin daily. Remove & Discard patch within 12 hours or as directed by MD Patient not taking: Reported on 04/07/2023    [provider]  lidocaine  (XYLOCAINE ) 5 % ointment Apply 1 Application topically See admin instructions. 1 application every 2 hours as needed for pain    [provider]  multivitamin (RENA-VIT) TABS tablet Take 1 tablet by mouth at bedtime. Patient not taking: Reported on 04/07/2023 03/25/23   Danton Reyes DASEN, MD  nutrition supplement, JUVEN, [facility]) PACK Take 1 packet by mouth 2 (two) times daily  between meals. Patient not taking: Reported on 04/07/2023    [provider]  OXYGEN  Inhale 4 L into the lungs See admin instructions. Oxygen  at 4L hr via nasal cannula and will use portable oxygen  machine when up in wheelchair. 10/28/21   [provider]  PROTEIN PO Take 30 mLs by mouth 2 (two) times daily. Patient not taking: Reported on 04/07/2023    [provider]  simethicone  (MYLICON) 80 MG chewable tablet Chew 80 mg by mouth every 6 (six) hours as needed for flatulence.    [provider]  sodium chloride  0.9 % SOLN 100 mL with sodium thiosulfate  250 MG/ML SOLN 25 g Inject 25 g into the vein every Monday, Wednesday, and Friday with hemodialysis. 03/25/23   Danton Reyes DASEN, MD  sucroferric oxyhydroxide (VELPHORO ) 500 MG chewable tablet Chew 500 mg by mouth 3 (three) times daily with meals.    [provider]   Current Facility-Administered Medications  Medication Dose Route Frequency Provider Last Rate Last Admin   albumin  human 25 % solution 25 g  25 g Intravenous Once Rayburn Pac, MD       calcium  gluconate inj 10% (1 g) URGENT USE ONLY!  1 g Intravenous Once Jarold Planas M, PA-C       midodrine  (PROAMATINE ) tablet 10 mg  10 mg Oral TID WC Syann Cupples, MD   10 mg at 04/07/23 9155   Current Outpatient Medications  Medication Sig Dispense Refill   aspirin  EC 81 MG tablet Take 1 tablet (81 mg total) by mouth daily. Swallow whole. 30 tablet 12   Darbepoetin Alfa  (ARANESP ) 200 MCG/0.4ML SOSY injection Inject 200 mcg into the skin once a week. Every Tuesday     dicyclomine  (BENTYL ) 10 MG capsule Take 10 mg by mouth 3 (three) times daily.     docusate sodium  (COLACE) 100 MG capsule Take 100 mg by mouth 2 (two) times daily.     famotidine  (PEPCID ) 20 MG tablet Take 1 tablet (20 mg total) by mouth daily.  levothyroxine  (SYNTHROID ) 100 MCG tablet Take 100 mcg by mouth daily.     midodrine  (PROAMATINE ) 10 MG tablet Take 1 tablet  (10 mg total) by mouth every Monday, Wednesday, and Friday at 8 PM. Before hemodialysis     pantoprazole  (PROTONIX ) 40 MG tablet Take 1 tablet (40 mg total) by mouth daily. 30 tablet 5   Patiromer  Sorbitex Calcium  (VELTASSA ) 16.8 g PACK Take 16.8 g by mouth every Monday, Wednesday, and Friday.     senna (SENOKOT) 8.6 MG TABS tablet Take 2 tablets (17.2 mg total) by mouth daily.     acetaminophen  (TYLENOL ) 325 MG tablet Take 2 tablets (650 mg total) by mouth every 6 (six) hours as needed for mild pain (pain score 1-3) (or Fever >/= 101).     atorvastatin  (LIPITOR) 10 MG tablet Take 1 tablet (10 mg total) by mouth daily. (Patient not taking: Reported on 04/07/2023) 30 tablet 5   cephALEXin  (KEFLEX ) 500 MG capsule Take 500 mg by mouth every 8 (eight) hours. (Patient not taking: Reported on 04/07/2023)     fentaNYL  (DURAGESIC ) 25 MCG/HR Place 1 patch onto the skin every 3 (three) days. (Patient not taking: Reported on 04/07/2023)     gabapentin  (NEURONTIN ) 300 MG capsule Take 300 mg by mouth 3 (three) times daily.     guaiFENesin  (ROBITUSSIN) 100 MG/5ML liquid Take 100 mg by mouth every 4 (four) hours as needed for cough.     lidocaine  (LIDODERM ) 5 % Place 1 patch onto the skin daily. Remove & Discard patch within 12 hours or as directed by MD (Patient not taking: Reported on 04/07/2023)     lidocaine  (XYLOCAINE ) 5 % ointment Apply 1 Application topically See admin instructions. 1 application every 2 hours as needed for pain     multivitamin (RENA-VIT) TABS tablet Take 1 tablet by mouth at bedtime. (Patient not taking: Reported on 04/07/2023)     nutrition supplement, JUVEN, (JUVEN) PACK Take 1 packet by mouth 2 (two) times daily between meals. (Patient not taking: Reported on 04/07/2023)     OXYGEN  Inhale 4 L into the lungs See admin instructions. Oxygen  at 4L hr via nasal cannula and will use portable oxygen  machine when up in wheelchair.     PROTEIN PO Take 30 mLs by mouth 2 (two) times daily. (Patient  not taking: Reported on 04/07/2023)     simethicone  (MYLICON) 80 MG chewable tablet Chew 80 mg by mouth every 6 (six) hours as needed for flatulence.     sodium chloride  0.9 % SOLN 100 mL with sodium thiosulfate  250 MG/ML SOLN 25 g Inject 25 g into the vein every Monday, Wednesday, and Friday with hemodialysis.     sucroferric oxyhydroxide (VELPHORO ) 500 MG chewable tablet Chew 500 mg by mouth 3 (three) times daily with meals.     Labs: Basic Metabolic Panel: Recent Labs  Lab 04/07/23 0313 04/07/23 0321  NA 132* 131*  K 6.0* 5.8*  CL 94* 100  CO2 17*  --   GLUCOSE 69* 67*  BUN 81* 71*  CREATININE 13.58* 13.90*  CALCIUM  9.3  --    Liver Function Tests: Recent Labs  Lab 04/07/23 0313  AST 107*  ALT 82*  ALKPHOS 102  BILITOT 2.0*  PROT 7.5  ALBUMIN  3.5   No results for input(s): LIPASE, AMYLASE in the last 168 hours. No results for input(s): AMMONIA in the last 168 hours. CBC: Recent Labs  Lab 04/07/23 0313 04/07/23 0321  WBC 8.0  --   NEUTROABS  5.8  --   HGB 11.7* 14.3  HCT 35.8* 42.0  MCV 97.0  --   PLT 74*  --    Cardiac Enzymes: No results for input(s): CKTOTAL, CKMB, CKMBINDEX, TROPONINI in the last 168 hours. CBG: Recent Labs  Lab 04/07/23 0418  GLUCAP 78   Iron Studies: No results for input(s): IRON, TIBC, TRANSFERRIN, FERRITIN in the last 72 hours. Studies/Results: CT HEAD WO CONTRAST ( ) Result Date: 04/07/2023 CLINICAL DATA:  Delirium EXAM: CT HEAD WITHOUT CONTRAST TECHNIQUE: Contiguous axial images were obtained from the base of the skull through the vertex without intravenous contrast. RADIATION DOSE REDUCTION: This exam was performed according to the departmental dose-optimization program which includes automated exposure control, adjustment of the mA and/or kV according to patient size and/or use of iterative reconstruction technique. COMPARISON:  03/16/2023 FINDINGS: Brain: No evidence of acute infarction, hemorrhage,  hydrocephalus, extra-axial collection or mass lesion/mass effect. Calcification along the right sylvian fissure that was also seen on prior and is nonspecific in isolation. No associated infarct to imply arterial embolic disease. Vascular: No hyperdense vessel. Extensive arterial calcification in the skull base and scalp. Skull: No acute or aggressive finding. Sinuses/Orbits: No emergent finding IMPRESSION: No acute or interval finding. Electronically Signed   By: Dorn Roulette M.D.   On: 04/07/2023 04:45   DG Chest Port 1 View Result Date: 04/07/2023 CLINICAL DATA:  Altered mental status.  Shortness of breath. EXAM: PORTABLE CHEST 1 VIEW COMPARISON:  Radiograph 03/16/2023 FINDINGS: Left-sided dialysis catheter unchanged in position. Cardiomegaly is unchanged. Stable mediastinal contours. Worsening hazy perihilar opacities suspicious for edema. Possible pleural effusions, not well assessed due to habitus. No pneumothorax. IMPRESSION: Cardiomegaly with worsening hazy perihilar opacities suspicious for edema. Possible pleural effusions. Electronically Signed   By: Andrea Gasman M.D.   On: 04/07/2023 04:25    ROS: Review of systems not obtained due to patient factors. Physical Exam: Vitals:   04/07/23 0700 04/07/23 0730 04/07/23 0743 04/07/23 0838  BP: 98/77 99/61 99/61  111/73  Pulse: 90  79 88  Resp: 15 19 12 15   Temp:    97.8 F (36.6 C)  TempSrc:      SpO2: 95%  99% 100%      Weight change:  No intake or output data in the 24 hours ending 04/07/23 0855 BP 111/73   Pulse 88   Temp 97.8 F (36.6 C)   Resp 15   SpO2 100%  General appearance: no distress and slowed mentation Head: Normocephalic, without obvious abnormality, atraumatic Resp: clear to auscultation bilaterally Cardio: RRR, no rub  GI: soft, non-tender; bowel sounds normal; no masses,  no organomegaly Extremities: s/p bilateral AKA's Dialysis Access:  Dialysis Orders: Center: [facility] MWF 4hr 2/2 bath EDW  118.5kg 400 1.5 180NRe Last tx 12/24 leaving at 118.5kg TC  Assessment/Plan:  AMS - possibly due to hypotension, uremia (due to missed HD), and polypharmacy.  No evidence of infection.  Will plan to follow response after HD.  Further workup per primary svc.  ESRD -  Has been noncompliant with HD and has missed a whole week.  Will follow response and continue with TTS schedule while she remains an inpatient.  Hypertension/volume  - chronic hypotension.  Will start midodrine  10 mg tid and follow  Anemia  -  stable  Metabolic bone disease -   continue with home meds  Nutrition -  renal diet, carb modified when able to take po.  Fairy RONAL Sellar, MD [facility], [facility] Pager (  [phone] 04/07/2023, 8:55 AM

## 2023-04-07 NOTE — Progress Notes (Signed)
Pt receives out-pt HD at [facility] [place] on MWF. Will assist as needed.   Olivia Canter Renal Navigator [phone]

## 2023-04-07 NOTE — Progress Notes (Signed)
 NEW ADMISSION NOTE New Admission Note:   Arrival Method: stretcher from ER, had HD today inpt Mental Orientation: alert and oriented Telemetry: verfied Assessment: Completed Skin: see LDA and wound care consult IV: see LDA Pain: MD contacted for more than tylenol  but pt aware of concerns with polypharmacy Tubes: none Safety Measures: Safety Fall Prevention Plan has been given, discussed and signed Admission: Completed [facility] Orientation: Patient has been orientated to the room, unit and staff.  Family: present and supportive  Orders have been reviewed and implemented. Will continue to monitor the patient. Call light has been placed within reach and bed alarm has been activated.   Adarsh Mundorf A Proctor-Gann, RN

## 2023-04-07 NOTE — Progress Notes (Signed)
 ABG not drawn due to pt availability. Pt to HD, will request new order when pt returns to floor.

## 2023-04-07 NOTE — Progress Notes (Signed)
 CSW spoke with Courtney Gray at [facility] whjo states patient is LTC at the facility and can return when medically cleared for discharge.  Edwin Dada, MSW, LCSW Transitions of Care  Clinical Social Worker II [phone]

## 2023-04-07 NOTE — ED Triage Notes (Addendum)
 Pt BIB [facility] for AMS. EMS reports A&Ox4 but slow to respond. Facility reports they think R index finger looks like gangrene. Pt is c/o of chronic back and leg pain. EMS unable to obtain BP. Dialysis MWF, last day dialysis received was Wednesday.  90 P CBG 85 98% 3L [place]

## 2023-04-07 NOTE — ED Notes (Signed)
 ED TO INPATIENT HANDOFF REPORT  ED Nurse Name and Phone #: Brogan Martis [phone]  S Name/Age/Gender Courtney Gray 52 y.o. female Room/Bed: 046C/046C  Code Status   Code Status: Full Code  Home/SNF/Other Home Patient oriented to: self, place, time, and situation Is this baseline? Yes   Triage Complete: Triage complete  Chief Complaint Hyperkalemia [E87.5] ESRD needing dialysis (HCC) [N18.6, Z99.2]  Triage Note Pt BIB [facility] for [facility]. EMS reports A&Ox4 but slow to respond. Facility reports they think R index finger looks like gangrene. Pt is c/o of chronic back and leg pain. EMS unable to obtain BP. Dialysis MWF, last day dialysis received was Wednesday.  90 P CBG 85 98% 3L [place]   Allergies Allergies  Allergen Reactions   Benzonatate Other (See Comments)    Hallucinations   Hydralazine  Other (See Comments)    Hallucinations   Amoxicillin Itching and Nausea And Vomiting   Clonidine Other (See Comments)    Altered mental state, insomnia    Chlorhexidine  Itching    Patient reports it is the CHG baths that cause her itching not the CHG used for caring for her IV/HD access.     Morphine  Itching    Level of Care/Admitting Diagnosis ED Disposition     ED Disposition  Admit   Condition  --   Comment  Hospital Area: [facility] [[id number]]  Level of Care: Telemetry Medical [104]  May admit patient to [facility] or [facility] if equivalent level of care is available:: No  Covid Evaluation: Confirmed COVID Negative  Diagnosis: ESRD needing dialysis [facility]) [[id number]]  Admitting Physician: CHARLTON EVALENE RAMAN [[id number]]  Attending Physician: CHARLTON EVALENE RAMAN [[id number]]  Certification:: I certify this patient will need inpatient services for at least 2 midnights  Expected Medical Readiness: 04/10/2023          B Medical/Surgery History Past Medical History:  Diagnosis Date   Arthritis    Bacteremia    Calciphylaxis    Chronic HFrEF (heart failure with reduced  ejection fraction) (HCC)    Diabetes mellitus without complication (HCC)    Dialysis complication, subsequent encounter    ESRD (end stage renal disease) (HCC)    Gout    High cholesterol    Hypertension    Hypertensive emergency    Ischemic foot pain at rest 08/03/2021   Renal disorder    Right atrial mass    Severe mitral regurgitation    Past Surgical History:  Procedure Laterality Date   ABDOMINAL AORTOGRAM W/LOWER EXTREMITY N/A 08/07/2021   Procedure: ABDOMINAL AORTOGRAM W/LOWER EXTREMITY;  Surgeon: Lanis Fonda BRAVO, MD;  Location: [facility] INVASIVE CV LAB;  Service: Cardiovascular;  Laterality: N/A;   AMPUTATION Right 08/14/2021   Procedure: AMPUTATION ABOVE KNEE;  Surgeon: Gretta Lonni PARAS, MD;  Location: [facility] OR;  Service: Vascular;  Laterality: Right;   AMPUTATION Left 09/23/2021   Procedure: AMPUTATION MIDDLE FINGER;  Surgeon: Romona Harari, MD;  Location: [facility] OR;  Service: Orthopedics;  Laterality: Left;   AMPUTATION Left 10/09/2021   Procedure: LEFT ABOVE KNEE AMPUTATION;  Surgeon: Magda Debby SAILOR, MD;  Location: [facility] OR;  Service: Vascular;  Laterality: Left;   AV FISTULA PLACEMENT     x2, unsuccessful.    BUBBLE STUDY  09/27/2021   Procedure: BUBBLE STUDY;  Surgeon: Pietro Redell RAMAN, MD;  Location: [facility] ENDOSCOPY;  Service: Cardiovascular;;   IR FLUORO GUIDE CV LINE LEFT  12/14/2020   IR FLUORO GUIDE CV LINE LEFT  02/20/2021   IR  FLUORO GUIDE CV LINE LEFT  09/11/2021   IR FLUORO GUIDE CV LINE LEFT  10/18/2021   IR FLUORO GUIDE CV LINE LEFT  11/26/2021   IR FLUORO GUIDE CV LINE LEFT  11/28/2021   IR FLUORO GUIDE CV LINE LEFT  02/04/2022   IR FLUORO GUIDE CV LINE LEFT  03/07/2022   IR FLUORO GUIDE CV LINE RIGHT  08/28/2020   IR FLUORO GUIDE CV LINE RIGHT  05/13/2021   IR FLUORO GUIDE CV LINE RIGHT  05/24/2021   IR INTRAVASCULAR ULTRASOUND NON CORONARY  03/07/2022   IR PERC TUN PERIT CATH WO PORT S&I /IMAG  09/06/2020   IR PTA VENOUS EXCEPT DIALYSIS CIRCUIT  02/20/2021   IR PTA VENOUS  EXCEPT DIALYSIS CIRCUIT  05/13/2021   IR PTA VENOUS EXCEPT DIALYSIS CIRCUIT  11/28/2021   IR PTA VENOUS EXCEPT DIALYSIS CIRCUIT  03/07/2022   IR REMOVAL TUN CV CATH W/O FL  05/24/2021   IR REMOVAL TUN CV CATH W/O FL  10/15/2021   IR US  GUIDE VASC ACCESS LEFT  09/06/2020   IR US  GUIDE VASC ACCESS LEFT  09/11/2021   IR US  GUIDE VASC ACCESS LEFT  10/18/2021   IR US  GUIDE VASC ACCESS RIGHT  05/24/2021   IR US  GUIDE VASC ACCESS RIGHT  09/10/2021   IR VENO/JUGULAR RIGHT  09/10/2021   IR VENOCAVAGRAM SVC  02/20/2021   IR VENOCAVAGRAM SVC  11/26/2021   IR VENOCAVAGRAM SVC  03/07/2022   TEE WITHOUT CARDIOVERSION N/A 09/27/2021   Procedure: TRANSESOPHAGEAL ECHOCARDIOGRAM (TEE);  Surgeon: Pietro Redell RAMAN, MD;  Location: [facility] ENDOSCOPY;  Service: Cardiovascular;  Laterality: N/A;   TEE WITHOUT CARDIOVERSION N/A 11/11/2022   Procedure: TRANSESOPHAGEAL ECHOCARDIOGRAM;  Surgeon: Shlomo Wilbert SAUNDERS, MD;  Location: [facility] INVASIVE CV LAB;  Service: Cardiovascular;  Laterality: N/A;   UPPER EXTREMITY VENOGRAPHY Right 12/04/2021   Procedure: UPPER EXTREMITY VENOGRAPHY;  Surgeon: Lanis Fonda BRAVO, MD;  Location: [facility] INVASIVE CV LAB;  Service: Cardiovascular;  Laterality: Right;     A IV Location/Drains/Wounds Patient Lines/Drains/Airways Status     Active Line/Drains/Airways     Name Placement date Placement time Site Days   Peripheral IV 04/07/23 20 G 1 Left Antecubital 04/07/23  0343  Antecubital  less than 1   Fistula / Graft Right Upper arm Arteriovenous fistula 11/28/19  --  Upper arm  1226   Hemodialysis Catheter Left Internal jugular Double lumen Permanent (Tunneled) 03/07/22  1105  Internal jugular  396   Pressure Injury 05/26/22 Ischial tuberosity Right Stage 4 - Full thickness tissue loss with exposed bone, tendon or muscle. 4 cms x 2 cms x 5 cms 05/26/22  0800  -- 316   Pressure Injury 11/06/22 Ischial tuberosity Right Stage 4 - Full thickness tissue loss with exposed bone, tendon or muscle. 4 CM X 2 CM X 4 CM 100% pink  moist 11/06/22  0005  -- 152   Pressure Injury 03/17/23 Thigh Posterior;Proximal;Right Stage 4 - Full thickness tissue loss with exposed bone, tendon or muscle. 03/17/23  2000  -- 21   Wound / Incision (Open or Dehisced) 05/26/22 Non-pressure wound Abdomen Left;Lower 4 cms x 3 cms x 0.8 cms full thickness 05/26/22  0800  Abdomen  316   Wound / Incision (Open or Dehisced) 05/26/22 Non-pressure wound Flank Lower;Right;Lateral 5 cms x 1 cms x 0.1 cms full thickness 05/26/22  0800  Flank  316   Wound / Incision (Open or Dehisced) 03/17/23 Irritant Dermatitis (Moisture Associated Skin Damage) Groin Anterior;Right  03/17/23  2000  Groin  21   Wound / Incision (Open or Dehisced) 03/17/23 Non-pressure wound Flank Left 03/17/23  2000  Flank  21   Wound / Incision (Open or Dehisced) 03/17/23 Non-pressure wound Flank Right 03/17/23  2000  Flank  21   Wound / Incision (Open or Dehisced) 03/17/23 Non-pressure wound Abdomen Anterior;Left 03/17/23  2000  Abdomen  21            Intake/Output Last 24 hours  Intake/Output Summary (Last 24 hours) at 04/07/2023 1440 Last data filed at 04/07/2023 1254 Gross per 24 hour  Intake --  Output 2000 ml  Net -2000 ml    Labs/Imaging Results for orders placed or performed during the hospital encounter of 04/07/23 (from the past 48 hours)  CBC with Differential     Status: Abnormal   Collection Time: 04/07/23  3:13 AM  Result Value Ref Range   WBC 8.0 4.0 - 10.5 K/uL   RBC 3.69 (L) 3.87 - 5.11 MIL/uL   Hemoglobin 11.7 (L) 12.0 - 15.0 g/dL   HCT 64.1 (L) 63.9 - 53.9 %   MCV 97.0 80.0 - 100.0 fL   MCH 31.7 26.0 - 34.0 pg   MCHC 32.7 30.0 - 36.0 g/dL   RDW 77.6 (H) 88.4 - 84.4 %   Platelets 74 (L) 150 - 400 K/uL    Comment: Immature Platelet Fraction may be clinically indicated, consider ordering this additional test [id number] REPEATED TO VERIFY PLATELET COUNT CONFIRMED BY SMEAR    nRBC 0.4 (H) 0.0 - 0.2 %   Neutrophils Relative % 73 %   Neutro Abs 5.8  1.7 - 7.7 K/uL   Lymphocytes Relative 13 %   Lymphs Abs 1.1 0.7 - 4.0 K/uL   Monocytes Relative 8 %   Monocytes Absolute 0.7 0.1 - 1.0 K/uL   Eosinophils Relative 5 %   Eosinophils Absolute 0.4 0.0 - 0.5 K/uL   Basophils Relative 1 %   Basophils Absolute 0.1 0.0 - 0.1 K/uL   Immature Granulocytes 0 %   Abs Immature Granulocytes 0.02 0.00 - 0.07 K/uL    Comment: Performed at [facility], [address] [place], [place] [address]  Comprehensive metabolic panel     Status: Abnormal   Collection Time: 04/07/23  3:13 AM  Result Value Ref Range   Sodium 132 (L) 135 - 145 mmol/L   Potassium 6.0 (H) 3.5 - 5.1 mmol/L   Chloride 94 (L) 98 - 111 mmol/L   CO2 17 (L) 22 - 32 mmol/L   Glucose, Bld 69 (L) 70 - 99 mg/dL    Comment: Glucose reference range applies only to samples taken after fasting for at least 8 hours.   BUN 81 (H) 6 - 20 mg/dL   Creatinine, Ser 86.41 (H) 0.44 - 1.00 mg/dL    Comment: DELTA CHECK NOTED   Calcium  9.3 8.9 - 10.3 mg/dL   Total Protein 7.5 6.5 - 8.1 g/dL   Albumin  3.5 3.5 - 5.0 g/dL   AST 892 (H) 15 - 41 U/L   ALT 82 (H) 0 - 44 U/L   Alkaline Phosphatase 102 38 - 126 U/L   Total Bilirubin 2.0 (H) 0.0 - 1.2 mg/dL   GFR, Estimated 3 (L) >60 mL/min    Comment: (NOTE) Calculated using the CKD-EPI Creatinine Equation (2021)    Anion gap 21 (H) 5 - 15    Comment: ELECTROLYTES REPEATED TO VERIFY Performed at [facility], [address], [place], [place] [address]   Blood culture (routine x 2)     Status: None (Preliminary result)   Collection Time: 04/07/23  3:14 AM   Specimen: BLOOD  Result Value Ref Range   Specimen Description BLOOD BLOOD LEFT ARM    Special Requests      BOTTLES DRAWN AEROBIC ONLY Blood Culture adequate volume   Culture      NO GROWTH < 12 HOURS Performed at [facility], [address], [place], [place] [address]    Report Status PENDING   Blood culture (routine x 2)     Status: None (Preliminary result)    Collection Time: 04/07/23  3:14 AM   Specimen: BLOOD  Result Value Ref Range   Specimen Description BLOOD BLOOD RIGHT HAND    Special Requests      BOTTLES DRAWN AEROBIC ONLY Blood Culture adequate volume   Culture      NO GROWTH < 12 HOURS Performed at [facility], [address], [place], [place] [address]    Report Status PENDING   I-Stat CG4 Lactic Acid     Status: Abnormal   Collection Time: 04/07/23  3:21 AM  Result Value Ref Range   Lactic Acid, Venous 2.4 (HH) 0.5 - 1.9 mmol/L   Comment NOTIFIED PHYSICIAN   I-stat chem 8, ED (not at [facility], [facility] or [facility])     Status: Abnormal   Collection Time: 04/07/23  3:21 AM  Result Value Ref Range   Sodium 131 (L) 135 - 145 mmol/L   Potassium 5.8 (H) 3.5 - 5.1 mmol/L   Chloride 100 98 - 111 mmol/L   BUN 71 (H) 6 - 20 mg/dL   Creatinine, Ser 86.09 (H) 0.44 - 1.00 mg/dL   Glucose, Bld 67 (L) 70 - 99 mg/dL    Comment: Glucose reference range applies only to samples taken after fasting for at least 8 hours.   Calcium , Ion 0.95 (L) 1.15 - 1.40 mmol/L   TCO2 19 (L) 22 - 32 mmol/L   Hemoglobin 14.3 12.0 - 15.0 g/dL   HCT 57.9 63.9 - 53.9 %  Resp panel by RT-PCR (RSV, Flu A&B, Covid) Anterior Nasal Swab     Status: None   Collection Time: 04/07/23  4:07 AM   Specimen: Anterior Nasal Swab  Result Value Ref Range   SARS Coronavirus 2 by RT PCR NEGATIVE NEGATIVE   Influenza A by PCR NEGATIVE NEGATIVE   Influenza B by PCR NEGATIVE NEGATIVE    Comment: (NOTE) The Xpert Xpress SARS-CoV-2/FLU/RSV plus assay is intended as an aid in the diagnosis of influenza from Nasopharyngeal swab specimens and should not be used as a sole basis for treatment. Nasal washings and aspirates are unacceptable for Xpert Xpress SARS-CoV-2/FLU/RSV testing.  Fact Sheet for Patients: bloggercourse.com  Fact Sheet for Healthcare Providers: seriousbroker.it  This test is not yet approved or cleared by the  [place]  FDA and has been authorized for detection and/or diagnosis of SARS-CoV-2 by FDA under an Emergency Use Authorization (EUA). This EUA will remain in effect (meaning this test can be used) for the duration of the COVID-19 declaration under Section 564(b)(1) of the Act, 21 U.S.C. section 360bbb-3(b)(1), unless the authorization is terminated or revoked.     Resp Syncytial Virus by PCR NEGATIVE NEGATIVE    Comment: (NOTE) Fact Sheet for Patients: bloggercourse.com  Fact Sheet for Healthcare Providers: seriousbroker.it  This test is not yet approved or cleared by the [place]   FDA and has been authorized for detection and/or diagnosis of SARS-CoV-2 by FDA under an Emergency Use Authorization (EUA). This EUA will remain in effect (meaning this test can be used) for the duration of the COVID-19 declaration under Section 564(b)(1) of the Act, 21 U.S.C. section 360bbb-3(b)(1), unless the authorization is terminated or revoked.  Performed at [facility], [address], [place], [place] [address]   POC CBG, ED     Status: None   Collection Time: 04/07/23  4:18 AM  Result Value Ref Range   Glucose-Capillary 78 70 - 99 mg/dL    Comment: Glucose reference range applies only to samples taken after fasting for at least 8 hours.  Hepatitis B surface antigen     Status: None   Collection Time: 04/07/23  5:15 AM  Result Value Ref Range   Hepatitis B Surface Ag NON REACTIVE NON REACTIVE    Comment: Performed at [facility], [address] [place], [place] [address]  I-Stat CG4 Lactic Acid     Status: Abnormal   Collection Time: 04/07/23  5:22 AM  Result Value Ref Range   Lactic Acid, Venous 2.6 (HH) 0.5 - 1.9 mmol/L   Comment NOTIFIED PHYSICIAN    CT HEAD WO CONTRAST ( ) Result Date: 04/07/2023 CLINICAL DATA:  Delirium EXAM: CT HEAD WITHOUT CONTRAST TECHNIQUE: Contiguous axial images were obtained from the  base of the skull through the vertex without intravenous contrast. RADIATION DOSE REDUCTION: This exam was performed according to the departmental dose-optimization program which includes automated exposure control, adjustment of the mA and/or kV according to patient size and/or use of iterative reconstruction technique. COMPARISON:  03/16/2023 FINDINGS: Brain: No evidence of acute infarction, hemorrhage, hydrocephalus, extra-axial collection or mass lesion/mass effect. Calcification along the right sylvian fissure that was also seen on prior and is nonspecific in isolation. No associated infarct to imply arterial embolic disease. Vascular: No hyperdense vessel. Extensive arterial calcification in the skull base and scalp. Skull: No acute or aggressive finding. Sinuses/Orbits: No emergent finding IMPRESSION: No acute or interval finding. Electronically Signed   By: Dorn Roulette M.D.   On: 04/07/2023 04:45   DG Chest Port 1 View Result Date: 04/07/2023 CLINICAL DATA:  Altered mental status.  Shortness of breath. EXAM: PORTABLE CHEST 1 VIEW COMPARISON:  Radiograph 03/16/2023 FINDINGS: Left-sided dialysis catheter unchanged in position. Cardiomegaly is unchanged. Stable mediastinal contours. Worsening hazy perihilar opacities suspicious for edema. Possible pleural effusions, not well assessed due to habitus. No pneumothorax. IMPRESSION: Cardiomegaly with worsening hazy perihilar opacities suspicious for edema. Possible pleural effusions. Electronically Signed   By: Andrea Gasman M.D.   On: 04/07/2023 04:25    Pending Labs Unresulted Labs (From admission, onward)     Start     Ordered   04/08/23 0500  Renal function panel  Daily,   R      04/07/23 0856   04/08/23 0500  CBC  Tomorrow morning,   R        04/07/23 0856   04/07/23 0441  Hepatitis B surface antibody,quantitative  (New Admission Hemo Labs (Hepatitis B))  Once,   URGENT        04/07/23 0441            Vitals/Pain Today's Vitals    04/07/23 1248 04/07/23 1254 04/07/23 1355 04/07/23 1402  BP: (!) 119/90 125/89 (!) 124/50   Pulse: (!) 59 90    Resp: (!) 23 13    Temp: 98 F (36.7  C)   (!) 97.4 F (36.3 C)  TempSrc: Oral   Oral  SpO2: 100%     PainSc:        Isolation Precautions No active isolations  Medications Medications  calcium  gluconate inj 10% (1 g) URGENT USE ONLY! (1 g Intravenous Patient Refused/Not Given 04/07/23 0501)  midodrine  (PROAMATINE ) tablet 10 mg (10 mg Oral Not Given 04/07/23 1336)  sodium chloride  flush (NS) 0.9 % injection 3 mL (3 mLs Intravenous Not Given 04/07/23 1335)  acetaminophen  (TYLENOL ) tablet 650 mg (has no administration in time range)    Or  acetaminophen  (TYLENOL ) suppository 650 mg (has no administration in time range)  albuterol  (PROVENTIL ) (2.5 MG/3ML) 0.083% nebulizer solution 2.5 mg (has no administration in time range)  ondansetron  (ZOFRAN ) tablet 4 mg (has no administration in time range)    Or  ondansetron  (ZOFRAN ) injection 4 mg (has no administration in time range)  heparin  sodium (porcine) 1000 UNIT/ML injection (has no administration in time range)  albumin  human 25 % solution 25 g (25 g Intravenous New Bag/Given 04/07/23 9095)    Mobility non-ambulatory     Focused Assessments Renal Assessment Handoff:  Hemodialysis Schedule: Hemodialysis Schedule: Tuesday/Thursday/Saturday Last Hemodialysis date and time: today 04/07/23   Restricted appendage: left arm   R Recommendations: See Admitting Provider Note  Report given to:   Additional Notes:

## 2023-04-07 NOTE — Inpatient Diabetes Management (Signed)
 Inpatient Diabetes Program Recommendations  AACE/ADA: New Consensus Statement on Inpatient Glycemic Control (2015)  Target Ranges:  Prepandial:   less than 140 mg/dL      Peak postprandial:   less than 180 mg/dL (1-2 hours)      Critically ill patients:  140 - 180 mg/dL   Lab Results  Component Value Date   GLUCAP 78 04/07/2023   HGBA1C 5.1 03/16/2023    Review of Glycemic Control  Latest Reference Range & Units 04/07/23 03:13 04/07/23 03:21  Glucose 70 - 99 mg/dL 69 (L) 67 (L)  (L): Data is abnormally low Diabetes history: Type 2 DM Outpatient Diabetes medications: none Current orders for Inpatient glycemic control: none  Inpatient Diabetes Program Recommendations:    Consider adding CBG TID & HS.  Thanks, Tinnie Minus, MSN, RNC-OB Diabetes Coordinator [phone] (8a-5p)

## 2023-04-07 NOTE — ED Notes (Signed)
Patient transported to CT 

## 2023-04-07 NOTE — Progress Notes (Signed)
   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   Please place [facility] consult if needs arise

## 2023-04-07 NOTE — Procedures (Signed)
 I was present at this dialysis session. I have reviewed the session itself and made appropriate changes.   Vital signs in last 24 hours:  Temp:  [97.7 F (36.5 C)-97.8 F (36.6 C)] 97.8 F (36.6 C) (12/31 0838) Pulse Rate:  [78-90] 79 (12/31 0857) Resp:  [11-27] 14 (12/31 0857) BP: (66-130)/(49-94) 130/89 (12/31 0857) SpO2:  [95 %-100 %] 100 % (12/31 0857) Weight change:  There were no vitals filed for this visit.  Recent Labs  Lab 04/07/23 0313 04/07/23 0321  NA 132* 131*  K 6.0* 5.8*  CL 94* 100  CO2 17*  --   GLUCOSE 69* 67*  BUN 81* 71*  CREATININE 13.58* 13.90*  CALCIUM  9.3  --     Recent Labs  Lab 04/07/23 0313 04/07/23 0321  WBC 8.0  --   NEUTROABS 5.8  --   HGB 11.7* 14.3  HCT 35.8* 42.0  MCV 97.0  --   PLT 74*  --     Scheduled Meds:  calcium  gluconate  1 g Intravenous Once   midodrine   10 mg Oral TID WC   sodium chloride  flush  3 mL Intravenous Q12H   Continuous Infusions: PRN Meds:.acetaminophen  **OR** acetaminophen , albuterol , ondansetron  **OR** ondansetron  (ZOFRAN ) IV   Fairy DELENA Sellar,  MD 04/07/2023, 9:10 AM

## 2023-04-07 NOTE — Progress Notes (Signed)
 Blood sugar 49. Declined apple juice. Drinking regular ginger ale and eating food. Did not have lunch.

## 2023-04-08 DIAGNOSIS — N186 End stage renal disease: Secondary | ICD-10-CM | POA: Diagnosis not present

## 2023-04-08 DIAGNOSIS — Z992 Dependence on renal dialysis: Secondary | ICD-10-CM | POA: Diagnosis not present

## 2023-04-08 LAB — CBC
HCT: 37.4 % (ref 36.0–46.0)
Hemoglobin: 11.9 g/dL — ABNORMAL LOW (ref 12.0–15.0)
MCH: 31.5 pg (ref 26.0–34.0)
MCHC: 31.8 g/dL (ref 30.0–36.0)
MCV: 98.9 fL (ref 80.0–100.0)
Platelets: 70 10*3/uL — ABNORMAL LOW (ref 150–400)
RBC: 3.78 MIL/uL — ABNORMAL LOW (ref 3.87–5.11)
RDW: 23.1 % — ABNORMAL HIGH (ref 11.5–15.5)
WBC: 7.7 10*3/uL (ref 4.0–10.5)
nRBC: 0.8 % — ABNORMAL HIGH (ref 0.0–0.2)

## 2023-04-08 LAB — RENAL FUNCTION PANEL
Albumin: 3.4 g/dL — ABNORMAL LOW (ref 3.5–5.0)
Anion gap: 20 — ABNORMAL HIGH (ref 5–15)
BUN: 44 mg/dL — ABNORMAL HIGH (ref 6–20)
CO2: 19 mmol/L — ABNORMAL LOW (ref 22–32)
Calcium: 9.5 mg/dL (ref 8.9–10.3)
Chloride: 96 mmol/L — ABNORMAL LOW (ref 98–111)
Creatinine, Ser: 8.79 mg/dL — ABNORMAL HIGH (ref 0.44–1.00)
GFR, Estimated: 5 mL/min — ABNORMAL LOW (ref >60)
Glucose, Bld: 78 mg/dL (ref 70–99)
Phosphorus: 6.6 mg/dL — ABNORMAL HIGH (ref 2.5–4.6)
Potassium: 4.9 mmol/L (ref 3.5–5.1)
Sodium: 135 mmol/L (ref 135–145)

## 2023-04-08 LAB — GLUCOSE, CAPILLARY
Glucose-Capillary: 74 mg/dL (ref 70–99)
Glucose-Capillary: 86 mg/dL (ref 70–99)
Glucose-Capillary: 101 mg/dL — ABNORMAL HIGH (ref 70–99)
Glucose-Capillary: 79 mg/dL (ref 70–99)

## 2023-04-08 LAB — HEPATIC FUNCTION PANEL
ALT: 80 U/L — ABNORMAL HIGH (ref 0–44)
AST: 90 U/L — ABNORMAL HIGH (ref 15–41)
Albumin: 3.4 g/dL — ABNORMAL LOW (ref 3.5–5.0)
Alkaline Phosphatase: 100 U/L (ref 38–126)
Bilirubin, Direct: 1.1 mg/dL — ABNORMAL HIGH (ref 0.0–0.2)
Indirect Bilirubin: 1.3 mg/dL — ABNORMAL HIGH (ref 0.3–0.9)
Total Bilirubin: 2.4 mg/dL — ABNORMAL HIGH (ref 0.0–1.2)
Total Protein: 7.2 g/dL (ref 6.5–8.1)

## 2023-04-08 LAB — LACTIC ACID, PLASMA: Lactic Acid, Venous: 3.6 mmol/L (ref 0.5–1.9)

## 2023-04-08 LAB — HEPATITIS B SURFACE ANTIBODY, QUANTITATIVE: Hep B S AB Quant (Post): 3.5 m[IU]/mL — ABNORMAL LOW

## 2023-04-08 MED ORDER — CLONAZEPAM 0.5 MG PO TBDP
1.0000 mg | ORAL_TABLET | Freq: Two times a day (BID) | ORAL | Status: DC | PRN
  Administered 2023-04-08 – 2023-04-10 (×3): 1 mg via ORAL
  Filled 2023-04-08 (×3): qty 2

## 2023-04-08 NOTE — Progress Notes (Signed)
 HOSPITALIST                ROUNDING                 NOTE Avarose Mervine FMW:[id number]  DOB: 1971/02/06  DOA: 04/07/2023  PCP: Default, Provider, MD  04/08/2023,1:25 PM   LOS: 1 day      Code Status: Full From: [facility]   current Dispo: [facility]       53 year old F ESRD on HD TTS,  Hospitalization 11/01/21--05/15/22 for Bacteroides Thetaiotaomicron bacteremia, hepatic and splenic abscess --required amputation of third digit of left hand and underwent left AKA and had splenic drain as well as CT-guided drainage of liver abscess she had a line holiday on last admission on long-term IV antibiotics until 11/08/2021 diastolic CHF,PVD s/p bilateral AKA, IDDM-2, morbid obesity and  chronic sacral wound She was recently hospitalized 12/9-12 18 2024 for sedation and toxic metabolic encephalopathy likely secondary to various meds such as Flexeril /Neurontin  causing encephalopathy in the setting of HD--- it was also felt that this was secondary to hypoglycemia Represented back to the hospital 12/31 and was carrying on a normal conversation she became more lethargic with decreased p.o. intake on 12/24 and her last HD was on 12/24 she was not able to go to HD on Sunday due to the holiday schedule She was afebrile with tachypnea and hypotension MAP less than 65--- potassium 6.0 BUN/creatinine 81/13.5 sodium 132 bicarb 17 Lactic acid 2.4 WBC 8 hemoglobin 11.7 platelets 74  Patient was admitted with acute metabolic encephalopathy   Plan  Toxic metabolic encephalopathy DDx meds with sedating potential versus hypoglycemia Meds minimized to include only Klonopin  1 mg twice daily, oxycodone  5 mg every 6 as needed CBG's ranging 70-80 whereas on arrival was in the 40 range which could account for the confusion  Hypoglycemia in the setting of DM TY 2-A1c recently 12.9 No longer on insulin -risk of encephalopathy too great--- hold coverage and follow-up as an outpatient--- will likely need to have CBGs before  meals Last hospitalization had cosyntropin  testing which was negative-workup suggestive poor p.o. intake polypharmacy as main cause  Fingertip ischemia dry gangrene Lactic acidosis Last hospital stay vascular as outpatient arterial duplex without significant stenosis or occlusion-patient was told she may be at high risk for finger amputation we will follow wounds in the morning Lactic acidosis is probably from hypoperfusion-would watch for fever spike  Chronic hypotension/ESRD TTS Continues on midodrine  10 3 times daily Resume Aranesp , [place]  as per renal but manage otherwise with dialysis Continues on Renagel  1600 3 times daily meals  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 from hypotension as well as passive congestion-LFTs have stabilized some degree and are little bit lower than they were  Hypothyroid Continues on Synthroid  100 mcg-last checked 12/0 9 and TSH was 1.9-outpatient follow-up  Bilateral AKA Calciphylaxis abdominal pannus Avoid calcium  continue sodium thiosulfate  3 times a week with dialysis and foam dressing to abdominal wounds  Previous thrombosis tri-dialysis catheter-4.8X3 0.7 cm calcification right atrium-outpatient follow-up--nothing to do at this time  DVT prophylaxis: SCD  Status is: Inpatient Remains inpatient appropriate because:   Requires stability of sugars and mental state likely can go back to facility in 1 to 2 days      Subjective: Awake coherent no distress No chest pain no fever Sitting up comfortably in the bed   Objective + exam Vitals:   04/07/23 2108 04/08/23 0042 04/08/23 0556 04/08/23 1001  BP: (!) 88/69 114/62 (!) 81/52 107/77  Pulse: 77  76 64 89  Resp:  20 20   Temp: (!) 97.3 F (36.3 C) (!) 97.3 F (36.3 C) (!) 97.3 F (36.3 C)   TempSrc:  Oral    SpO2:  100% 96% 100%   There were no vitals filed for this visit.  Examination: EOMI NCAT no focal deficit S1-S2 no murmur no rub nor gallop Chest clear no wheeze Abdomen soft did not  examine wounds Bilateral AKA's noted Index finger is bandaged on surgically absent middle finger on left hand and gangrenous index of the right hand  Data Reviewed: reviewed   CBC    Component Value Date/Time   WBC 7.7 04/08/2023 0932   RBC 3.78 (L) 04/08/2023 0932   HGB 11.9 (L) 04/08/2023 0932   HCT 37.4 04/08/2023 0932   PLT 70 (L) 04/08/2023 0932   MCV 98.9 04/08/2023 0932   MCH 31.5 04/08/2023 0932   MCHC 31.8 04/08/2023 0932   RDW 23.1 (H) 04/08/2023 0932   LYMPHSABS 1.1 04/07/2023 0313   MONOABS 0.7 04/07/2023 0313   EOSABS 0.4 04/07/2023 0313   BASOSABS 0.1 04/07/2023 0313      Latest Ref Rng & Units 04/08/2023    9:33 AM 04/08/2023    9:32 AM 04/07/2023    3:21 AM  CMP  Glucose 70 - 99 mg/dL 78   67   BUN 6 - 20 mg/dL 44   71   Creatinine 9.55 - 1.00 mg/dL 1.20   86.09   Sodium 864 - 145 mmol/L 135   131   Potassium 3.5 - 5.1 mmol/L 4.9   5.8   Chloride 98 - 111 mmol/L 96   100   CO2 22 - 32 mmol/L 19     Calcium  8.9 - 10.3 mg/dL 9.5     Total Protein 6.5 - 8.1 g/dL  7.2    Total Bilirubin 0.0 - 1.2 mg/dL  2.4    Alkaline Phos 38 - 126 U/L  100    AST 15 - 41 U/L  90    ALT 0 - 44 U/L  80      Scheduled Meds:  calcium  gluconate  1 g Intravenous Once   levothyroxine   100 mcg Oral Daily   midodrine   10 mg Oral TID   pantoprazole   40 mg Oral Daily   senna  2 tablet Oral Daily   sevelamer  carbonate  800 mg Oral TID WC   sodium chloride  flush  3 mL Intravenous Q12H   Continuous Infusions:  Time  46  Colen Grimes, MD  [facility]

## 2023-04-08 NOTE — Plan of Care (Signed)
   Problem: Education: Goal: Ability to describe self-care measures that may prevent or decrease complications (Diabetes Survival Skills Education) will improve Outcome: Progressing Goal: Individualized Educational Video(s) Outcome: Progressing

## 2023-04-08 NOTE — Progress Notes (Signed)
 [facility] Progress Note   Subjective:   Patient had HD yesterday. Noted some low BP overnight. Patient reports she is tired, initially reports SOB but is not wearing O2, improved once [place] was put back on. Denies CP, dizziness, nausea.   Objective Vitals:   04/07/23 2108 04/08/23 0042 04/08/23 0556 04/08/23 1001  BP: (!) 88/69 114/62 (!) 81/52 107/77  Pulse: 77 76 64 89  Resp:  20 20   Temp: (!) 97.3 F (36.3 C) (!) 97.3 F (36.3 C) (!) 97.3 F (36.3 C)   TempSrc:  Oral    SpO2:  100% 96% 100%   Physical Exam General: Chronically ill appearing female, slow to respond but NAD Heart: RRR, no murmurs, rubs or gallops Lungs: CTA bilaterally, respirations unlabored on RA Abdomen: Soft, non-distended, +BS Extremities: b/l AKAs Dialysis Access: [facility]  Additional Objective Labs: Basic Metabolic Panel: Recent Labs  Lab 04/07/23 0313 04/07/23 0321  NA 132* 131*  K 6.0* 5.8*  CL 94* 100  CO2 17*  --   GLUCOSE 69* 67*  BUN 81* 71*  CREATININE 13.58* 13.90*  CALCIUM  9.3  --    Liver Function Tests: Recent Labs  Lab 04/07/23 0313  AST 107*  ALT 82*  ALKPHOS 102  BILITOT 2.0*  PROT 7.5  ALBUMIN  3.5   No results for input(s): LIPASE, AMYLASE in the last 168 hours. CBC: Recent Labs  Lab 04/07/23 0313 04/07/23 0321 04/08/23 0932  WBC 8.0  --  7.7  NEUTROABS 5.8  --   --   HGB 11.7* 14.3 11.9*  HCT 35.8* 42.0 37.4  MCV 97.0  --  98.9  PLT 74*  --  70*   Blood Culture    Component Value Date/Time   SDES BLOOD BLOOD LEFT ARM 04/07/2023 0314   SDES BLOOD BLOOD RIGHT HAND 04/07/2023 0314   SPECREQUEST  04/07/2023 0314    BOTTLES DRAWN AEROBIC ONLY Blood Culture adequate volume   SPECREQUEST  04/07/2023 0314    BOTTLES DRAWN AEROBIC ONLY Blood Culture adequate volume   CULT  04/07/2023 0314    NO GROWTH 1 DAY Performed at [facility], [address], [place], [place] [address]    CULT  04/07/2023 0314    NO GROWTH 1 DAY Performed at  [facility], [address], [place], [place] [address]    REPTSTATUS PENDING 04/07/2023 0314   REPTSTATUS PENDING 04/07/2023 0314    Cardiac Enzymes: No results for input(s): CKTOTAL, CKMB, CKMBINDEX, TROPONINI in the last 168 hours. CBG: Recent Labs  Lab 04/07/23 0418 04/07/23 1737 04/07/23 1815 04/07/23 1937 04/08/23 0801  GLUCAP 78 49* 60* 176* 74   Iron Studies: No results for input(s): IRON, TIBC, TRANSFERRIN, FERRITIN in the last 72 hours. @lablastinr3 @ Studies/Results: CT HEAD WO CONTRAST ( ) Result Date: 04/07/2023 CLINICAL DATA:  Delirium EXAM: CT HEAD WITHOUT CONTRAST TECHNIQUE: Contiguous axial images were obtained from the base of the skull through the vertex without intravenous contrast. RADIATION DOSE REDUCTION: This exam was performed according to the departmental dose-optimization program which includes automated exposure control, adjustment of the mA and/or kV according to patient size and/or use of iterative reconstruction technique. COMPARISON:  03/16/2023 FINDINGS: Brain: No evidence of acute infarction, hemorrhage, hydrocephalus, extra-axial collection or mass lesion/mass effect. Calcification along the right sylvian fissure that was also seen on prior and is nonspecific in isolation. No associated infarct to imply arterial embolic disease. Vascular: No hyperdense vessel. Extensive arterial calcification in the skull base  and scalp. Skull: No acute or aggressive finding. Sinuses/Orbits: No emergent finding IMPRESSION: No acute or interval finding. Electronically Signed   By: Dorn Roulette M.D.   On: 04/07/2023 04:45   DG Chest Port 1 View Result Date: 04/07/2023 CLINICAL DATA:  Altered mental status.  Shortness of breath. EXAM: PORTABLE CHEST 1 VIEW COMPARISON:  Radiograph 03/16/2023 FINDINGS: Left-sided dialysis catheter unchanged in position. Cardiomegaly is unchanged. Stable mediastinal contours. Worsening hazy perihilar opacities  suspicious for edema. Possible pleural effusions, not well assessed due to habitus. No pneumothorax. IMPRESSION: Cardiomegaly with worsening hazy perihilar opacities suspicious for edema. Possible pleural effusions. Electronically Signed   By: Andrea Gasman M.D.   On: 04/07/2023 04:25   Medications:   calcium  gluconate  1 g Intravenous Once   levothyroxine   100 mcg Oral Daily   midodrine   10 mg Oral TID   pantoprazole   40 mg Oral Daily   senna  2 tablet Oral Daily   sevelamer  carbonate  800 mg Oral TID WC   sodium chloride  flush  3 mL Intravenous Q12H    Dialysis Orders: Center: [facility] MWF 4hr 2/2 bath EDW 118.5kg 400 1.5 180NRe  Assessment/Plan:  AMS - possibly due to hypotension, uremia (due to missed HD), and polypharmacy.  Seems somewhat improved post HD. Noted asterixis and has high dose gabapentin  on med list- this was stopped last admission and she is not sure if she was taking it again. Pt should not take gabapentin  or lyrica   ESRD -  Has been noncompliant with HD and has missed a whole week. Next HD tomorrow  Hypertension/volume  - chronic hypotension.  Started on midodrine  10mg  TID, follow trend  Anemia  -  Hgb at goal, no ESA indicated at this time  Metabolic bone disease -  Calcium  controlled, will check phos with next labs. continue with home meds  Nutrition -  renal diet, carb modified when able to take po.    Lucie Collet, PA-C 04/08/2023, 11:12 AM  [facility] Pager: [phone]

## 2023-04-09 DIAGNOSIS — N186 End stage renal disease: Secondary | ICD-10-CM | POA: Diagnosis not present

## 2023-04-09 DIAGNOSIS — Z992 Dependence on renal dialysis: Secondary | ICD-10-CM | POA: Diagnosis not present

## 2023-04-09 LAB — RENAL FUNCTION PANEL
Albumin: 3.2 g/dL — ABNORMAL LOW (ref 3.5–5.0)
Anion gap: 20 — ABNORMAL HIGH (ref 5–15)
BUN: 53 mg/dL — ABNORMAL HIGH (ref 6–20)
CO2: 21 mmol/L — ABNORMAL LOW (ref 22–32)
Calcium: 9.5 mg/dL (ref 8.9–10.3)
Chloride: 93 mmol/L — ABNORMAL LOW (ref 98–111)
Creatinine, Ser: 10.09 mg/dL — ABNORMAL HIGH (ref 0.44–1.00)
GFR, Estimated: 4 mL/min — ABNORMAL LOW (ref >60)
Glucose, Bld: 76 mg/dL (ref 70–99)
Phosphorus: 7.5 mg/dL — ABNORMAL HIGH (ref 2.5–4.6)
Potassium: 4.9 mmol/L (ref 3.5–5.1)
Sodium: 134 mmol/L — ABNORMAL LOW (ref 135–145)

## 2023-04-09 LAB — CBC WITH DIFFERENTIAL/PLATELET
Abs Immature Granulocytes: 0.05 10*3/uL (ref 0.00–0.07)
Basophils Absolute: 0 10*3/uL (ref 0.0–0.1)
Basophils Relative: 1 %
Eosinophils Absolute: 0.3 10*3/uL (ref 0.0–0.5)
Eosinophils Relative: 4 %
HCT: 36 % (ref 36.0–46.0)
Hemoglobin: 11.5 g/dL — ABNORMAL LOW (ref 12.0–15.0)
Immature Granulocytes: 1 %
Lymphocytes Relative: 15 %
Lymphs Abs: 1.2 10*3/uL (ref 0.7–4.0)
MCH: 31.9 pg (ref 26.0–34.0)
MCHC: 31.9 g/dL (ref 30.0–36.0)
MCV: 100 fL (ref 80.0–100.0)
Monocytes Absolute: 0.8 10*3/uL (ref 0.1–1.0)
Monocytes Relative: 10 %
Neutro Abs: 5.5 10*3/uL (ref 1.7–7.7)
Neutrophils Relative %: 69 %
Platelets: 69 10*3/uL — ABNORMAL LOW (ref 150–400)
RBC: 3.6 MIL/uL — ABNORMAL LOW (ref 3.87–5.11)
RDW: 23.1 % — ABNORMAL HIGH (ref 11.5–15.5)
Smear Review: DECREASED
WBC: 7.9 10*3/uL (ref 4.0–10.5)
nRBC: 0.8 % — ABNORMAL HIGH (ref 0.0–0.2)

## 2023-04-09 LAB — GLUCOSE, CAPILLARY
Glucose-Capillary: 101 mg/dL — ABNORMAL HIGH (ref 70–99)
Glucose-Capillary: 74 mg/dL (ref 70–99)
Glucose-Capillary: 75 mg/dL (ref 70–99)

## 2023-04-09 MED ORDER — HEPARIN SODIUM (PORCINE) 1000 UNIT/ML IJ SOLN
4200.0000 [IU] | INTRAMUSCULAR | Status: DC | PRN
  Administered 2023-04-09: 4200 [IU]
  Filled 2023-04-09: qty 5

## 2023-04-09 MED ORDER — MIDODRINE HCL 5 MG PO TABS
10.0000 mg | ORAL_TABLET | Freq: Three times a day (TID) | ORAL | Status: DC
  Administered 2023-04-09 – 2023-04-10 (×4): 10 mg via ORAL
  Filled 2023-04-09 (×4): qty 2

## 2023-04-09 MED ORDER — DIPHENHYDRAMINE HCL 25 MG PO CAPS
25.0000 mg | ORAL_CAPSULE | Freq: Four times a day (QID) | ORAL | Status: AC | PRN
  Administered 2023-04-09: 25 mg via ORAL
  Filled 2023-04-09: qty 1

## 2023-04-09 NOTE — Progress Notes (Signed)
 Patient 0600 blood pressure was 71/45, Patient was alert but slow to respond at baseline. MD contacted new order dose of midodrine was given continue to monitor.  Garwin Brothers RN

## 2023-04-09 NOTE — Procedures (Signed)
 I was present at this dialysis session. I have reviewed the session itself and made appropriate changes.   Vital signs in last 24 hours:  Temp:  [97.6 F (36.4 C)-98.7 F (37.1 C)] 97.6 F (36.4 C) (01/02 0929) Pulse Rate:  [51-88] 69 (01/02 1131) Resp:  [0-22] 0 (01/02 1130) BP: (71-155)/(31-91) 123/71 (01/02 1131) SpO2:  [90 %-100 %] 100 % (01/02 1131) Weight:  [118.2 kg] 118.2 kg (01/02 0929) Weight change:  Filed Weights   04/09/23 0929  Weight: 118.2 kg    Recent Labs  Lab 04/08/23 0933  NA 135  K 4.9  CL 96*  CO2 19*  GLUCOSE 78  BUN 44*  CREATININE 8.79*  CALCIUM  9.5  PHOS 6.6*    Recent Labs  Lab 04/07/23 0313 04/07/23 0321 04/08/23 0932  WBC 8.0  --  7.7  NEUTROABS 5.8  --   --   HGB 11.7* 14.3 11.9*  HCT 35.8* 42.0 37.4  MCV 97.0  --  98.9  PLT 74*  --  70*    Scheduled Meds:  calcium  gluconate  1 g Intravenous Once   levothyroxine   100 mcg Oral Daily   midodrine   10 mg Oral TID   pantoprazole   40 mg Oral Daily   senna  2 tablet Oral Daily   sevelamer  carbonate  800 mg Oral TID WC   sodium chloride  flush  3 mL Intravenous Q12H   Continuous Infusions: PRN Meds:.acetaminophen  **OR** acetaminophen , albuterol , clonazepam , dextrose , ondansetron  **OR** ondansetron  (ZOFRAN ) IV, oxyCODONE    Fairy DELENA Sellar,  MD 04/09/2023, 11:35 AM

## 2023-04-09 NOTE — TOC Initial Note (Signed)
 Transition of Care [facility]) - Initial/Assessment Note    Patient Details  Name: Courtney Gray MRN: [id number] Date of Birth: 1971-02-18  [facility] CM/SW Contact:    Almarie CHRISTELLA Goodie, LCSW Phone Number: 04/09/2023, 3:33 PM  Clinical Narrative:     CSW acknowledging consult for patient wanting new LTC SNF. CSW attempted to meet with patient, she was off unit for HD. Per chart review, new LTC placement was attempted on last admission but there were no beds available. CSW contacted daughter, Tedi, to discuss new SNF placement. Per Tedi [facility] SW was attempting to locate a new SNF for patient but none had been found yet. CSW discussed barriers for new LTC SNF, including lack of Medicaid beds and the patient needing dialysis, and Antoinette indicated understanding. CSW completed referral and faxed out, contacted facilities to ask about LTC beds and HD capacity. Awaiting responses.              Expected Discharge Plan: Skilled Nursing Facility Barriers to Discharge: Continued Medical Work up   Patient Goals and CMS Choice Patient states their goals for this hospitalization and ongoing recovery are:: find a new SNF CMS Medicare.gov Compare Post Acute Care list provided to:: Patient Represenative (must comment) Choice offered to / list presented to : Adult Children [facility] ownership interest in [facility].provided to:: Adult Children    Expected Discharge Plan and Services     Post Acute Care Choice: Skilled Nursing Facility Living arrangements for the past 2 months: Skilled Nursing Facility                                      Prior Living Arrangements/Services Living arrangements for the past 2 months: Skilled Nursing Facility Lives with:: Facility Resident Patient language and need for interpreter reviewed:: No        Need for Family Participation in Patient Care: No (Comment) Care giver support system in place?: No (comment)    Criminal Activity/Legal Involvement Pertinent to Current Situation/Hospitalization: No - Comment as needed  Activities of Daily Living   ADL Screening (condition at time of admission) Independently performs ADLs?: No Does the patient have a NEW difficulty with bathing/dressing/toileting/self-feeding that is expected to last >3 days?: No Does the patient have a NEW difficulty with getting in/out of bed, walking, or climbing stairs that is expected to last >3 days?: No Does the patient have a NEW difficulty with communication that is expected to last >3 days?: No Is the patient deaf or have difficulty hearing?: No Does the patient have difficulty seeing, even when wearing glasses/contacts?: No Does the patient have difficulty concentrating, remembering, or making decisions?: Yes  Permission Sought/Granted Permission sought to share information with : Facility Medical Sales Representative, Family Supports Permission granted to share information with : Yes, Verbal Permission Granted  Share Information with NAME: Antoinette  Permission granted to share info w AGENCY: SNF  Permission granted to share info w Relationship: Daughter     Emotional Assessment   Attitude/Demeanor/Rapport: Unable to Assess Affect (typically observed): Unable to Assess Orientation: : Oriented to Self, Oriented to Place, Oriented to  Time, Oriented to Situation Alcohol  / Substance Use: Not Applicable Psych Involvement: No (comment)  Admission diagnosis:  Hyperkalemia [E87.5] ESRD needing dialysis (HCC) [N18.6, Z99.2] Patient Active Problem List   Diagnosis Date Noted   ESRD needing dialysis (HCC) 04/07/2023   Acute metabolic encephalopathy 04/07/2023  History of type 2 diabetes mellitus 04/07/2023   Pancytopenia (HCC) 04/07/2023   Metabolic acidosis with increased anion gap and accumulation of organic acids 04/07/2023   Elevated liver enzymes 04/07/2023   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   Hyperkalemia 03/17/2023    Idiopathic hypotension 03/17/2023   Hypoglycemia 03/16/2023   Right atrial mass 11/10/2022   Pressure injury of right ischium, stage 4 (HCC) 11/07/2022   Elevated troponin 11/06/2022   Severe mitral regurgitation 11/06/2022   Acute exacerbation of CHF (congestive heart failure) (HCC) 11/05/2022   Generalized weakness 11/05/2022   Positive D dimer 09/09/2022   Prolonged QT interval 09/09/2022   Acquired hypothyroidism 09/09/2022   SOB (shortness of breath) 09/08/2022   Open abdominal wall wound 05/25/2022   Hemorrhagic shock (HCC) 05/25/2022   Nodule of skin of abdomen 02/16/2022   Acute on chronic combined systolic and diastolic CHF (congestive heart failure) (HCC) 02/11/2022   Chronic diastolic CHF (congestive heart failure) (HCC) 12/12/2021   PAD (peripheral artery disease) (HCC) 12/12/2021   Irritable bowel syndrome (IBS) 12/12/2021   Amputation of left middle finger 12/12/2021   GERD (gastroesophageal reflux disease) 12/12/2021   Hypertension    DM2 (diabetes mellitus, type 2) (HCC)    Decubitus ulcer of sacral region, stage 3 (HCC)    PVD (peripheral vascular disease) (HCC)    Hepatic abscess    Pressure injury of skin 09/13/2021   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HCC)    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   Lactic acidosis 09/10/2021   Mixed diabetic hyperlipidemia associated with type 2 diabetes mellitus (HCC) 09/10/2021   Coronary artery disease involving native coronary artery of native heart without angina pectoris 09/10/2021   Elevated troponin level not due to acute coronary syndrome 09/10/2021   Hypotension 09/10/2021   Dry gangrene (HCC)    Pain 08/03/2021   Volume overload 07/09/2021   Acute gout 06/11/2021   Hemorrhoids    HLD (hyperlipidemia) 05/18/2021   Rectal bleeding 05/17/2021   Morbid obesity with BMI of 60.0-69.9, adult (HCC) 05/17/2021   NSTEMI (non-ST elevated myocardial infarction) (HCC) 05/16/2021   Thoracic back pain 04/23/2021   ESRD on  dialysis (HCC) 09/01/2020   Chronic respiratory failure with hypoxia (HCC) 09/01/2020   Hypertensive urgency 11/28/2019   History of anemia due to chronic kidney disease 11/28/2019   PCP:  Default, Provider, MD Pharmacy:  No Pharmacies Listed    Social Drivers of Health (SDOH) Social History: SDOH Screenings   Food Insecurity: No Food Insecurity (04/07/2023)  Housing: Low Risk  (04/07/2023)  Transportation Needs: No Transportation Needs (04/07/2023)  Utilities: Not At Risk (04/07/2023)  Financial Resource Strain: High Risk (03/27/2018)   Received from [facility], [facility]  Social Connections: Unknown (08/20/2021)   Received from [facility], [facility]  Tobacco Use: Low Risk  (04/07/2023)  Health Literacy: Low Risk  (11/25/2022)   Received from [facility]   SDOH Interventions:     Readmission Risk Interventions    09/16/2021    4:36 PM 08/28/2021   11:50 AM 07/12/2021   12:38 PM  Readmission Risk Prevention Plan  Transportation Screening Complete Complete Complete  Medication Review (RN Care Manager)  Complete Complete  PCP or Specialist appointment within 3-5 days of discharge  Complete Complete  HRI or Home Care Consult Complete Complete Complete  SW Recovery Care/Counseling Consult Complete Complete Complete  Palliative Care Screening  Not Applicable Not Applicable  Skilled Nursing Facility Complete Not Applicable Complete

## 2023-04-09 NOTE — Progress Notes (Signed)
 Received patient in bed to unit.  Alert and oriented.  Informed consent signed and in chart.   TX duration: 3.5 hours  Patient tolerated well.  Transported back to the room  Alert, without acute distress.  Hand-off given to patient's nurse  Georg Jaun Nett  Access used: Right chest [facility] Access issues: none  Total UF removed: 2L Medication(s) given: Benadryl  Post HD VS: BP 113/84 p 79 resp 20 02 Sat 100% 3L [place] temp  Post HD weight: 116.2kg  Rock Hawk, RN Kidney Dialysis Unit

## 2023-04-09 NOTE — [place] FL2 (Signed)
 [place]  MEDICAID FL2 LEVEL OF CARE FORM     IDENTIFICATION  Patient Name: Courtney Gray Birthdate: Nov 03, 1970 Sex: female Admission Date (Current Location): 04/07/2023  [facility] and [place] Number:  Producer, Television/film/video and Address:  [facility] [place], [address] [place], [place] [address]      Provider Number: [id number]  Attending Physician Name and Address:  Royal Sill, MD  Relative Name and Phone Number:       Current Level of Care: Hospital Recommended Level of Care: Skilled Nursing Facility Prior Approval Number:    Date Approved/Denied:   PASRR Number: [id number]  Discharge Plan: SNF    Current Diagnoses: Patient Active Problem List   Diagnosis Date Noted   ESRD needing dialysis (HCC) 04/07/2023   Acute metabolic encephalopathy 04/07/2023   History of type 2 diabetes mellitus 04/07/2023   Pancytopenia (HCC) 04/07/2023   Metabolic acidosis with increased anion gap and accumulation of organic acids 04/07/2023   Elevated liver enzymes 04/07/2023   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   Hyperkalemia 03/17/2023   Idiopathic hypotension 03/17/2023   Hypoglycemia 03/16/2023   Right atrial mass 11/10/2022   Pressure injury of right ischium, stage 4 (HCC) 11/07/2022   Elevated troponin 11/06/2022   Severe mitral regurgitation 11/06/2022   Acute exacerbation of CHF (congestive heart failure) (HCC) 11/05/2022   Generalized weakness 11/05/2022   Positive D dimer 09/09/2022   Prolonged QT interval 09/09/2022   Acquired hypothyroidism 09/09/2022   SOB (shortness of breath) 09/08/2022   Open abdominal wall wound 05/25/2022   Hemorrhagic shock (HCC) 05/25/2022   Nodule of skin of abdomen 02/16/2022   Acute on chronic combined systolic and diastolic CHF (congestive heart failure) (HCC) 02/11/2022   Chronic diastolic CHF (congestive heart failure) (HCC) 12/12/2021   PAD (peripheral artery disease) (HCC) 12/12/2021   Irritable bowel syndrome  (IBS) 12/12/2021   Amputation of left middle finger 12/12/2021   GERD (gastroesophageal reflux disease) 12/12/2021   Hypertension    DM2 (diabetes mellitus, type 2) (HCC)    Decubitus ulcer of sacral region, stage 3 (HCC)    PVD (peripheral vascular disease) (HCC)    Hepatic abscess    Pressure injury of skin 09/13/2021   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HCC)    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   Lactic acidosis 09/10/2021   Mixed diabetic hyperlipidemia associated with type 2 diabetes mellitus (HCC) 09/10/2021   Coronary artery disease involving native coronary artery of native heart without angina pectoris 09/10/2021   Elevated troponin level not due to acute coronary syndrome 09/10/2021   Hypotension 09/10/2021   Dry gangrene (HCC)    Pain 08/03/2021   Volume overload 07/09/2021   Acute gout 06/11/2021   Hemorrhoids    HLD (hyperlipidemia) 05/18/2021   Rectal bleeding 05/17/2021   Morbid obesity with BMI of 60.0-69.9, adult (HCC) 05/17/2021   NSTEMI (non-ST elevated myocardial infarction) (HCC) 05/16/2021   Thoracic back pain 04/23/2021   ESRD on dialysis (HCC) 09/01/2020   Chronic respiratory failure with hypoxia (HCC) 09/01/2020   Hypertensive urgency 11/28/2019   History of anemia due to chronic kidney disease 11/28/2019    Orientation RESPIRATION BLADDER Height & Weight     Self, Time, Situation, Place  O2 ([place] 3L) Continent Weight: 256 lb 2.8 oz (116.2 kg) Height:     BEHAVIORAL SYMPTOMS/MOOD NEUROLOGICAL BOWEL NUTRITION STATUS      Continent Diet (renal with fluid restriction 1200 mL)  AMBULATORY STATUS COMMUNICATION OF NEEDS Skin   Extensive Assist  Verbally PU Stage and Appropriate Care, Other (Comment) (non pressure wound, right and left flank: foam dressing, lift every shift to assess and change daily)       PU Stage 4 Dressing: Daily (right thigh: foam dressing, lift every shift to assess)               Personal Care Assistance Level of Assistance   Bathing, Feeding, Dressing Bathing Assistance: Maximum assistance Feeding assistance: Limited assistance Dressing Assistance: Maximum assistance     Functional Limitations Info  Sight Sight Info: Impaired        SPECIAL CARE FACTORS FREQUENCY                       Contractures Contractures Info: Not present    Additional Factors Info  Code Status, Allergies Code Status Info: full Allergies Info: Benzonatate, Hydralazine , Amoxicillin, Clonidine, Chlorhexidine , Morphine            Current Medications (04/09/2023):  This is the current hospital active medication list Current Facility-Administered Medications  Medication Dose Route Frequency Provider Last Rate Last Admin   acetaminophen  (TYLENOL ) tablet 650 mg  650 mg Oral Q6H PRN Smith, Rondell A, MD       Or   acetaminophen  (TYLENOL ) suppository 650 mg  650 mg Rectal Q6H PRN Claudene Reeves A, MD       albuterol  (PROVENTIL ) (2.5 MG/3ML) 0.083% nebulizer solution 2.5 mg  2.5 mg Nebulization Q4H PRN Smith, Rondell A, MD       calcium  gluconate inj 10% (1 g) URGENT USE ONLY!  1 g Intravenous Once Jarold Planas M, PA-C       clonazePAM  (KLONOPIN ) disintegrating tablet 1 mg  1 mg Oral BID PRN Laveda Roosevelt, MD   1 mg at 04/09/23 0011   dextrose  50 % solution 50 mL  1 ampule Intravenous PRN Smith, Rondell A, MD   50 mL at 04/07/23 1856   levothyroxine  (SYNTHROID ) tablet 100 mcg  100 mcg Oral Daily Smith, Rondell A, MD   100 mcg at 04/09/23 0501   midodrine  (PROAMATINE ) tablet 10 mg  10 mg Oral TID Franky Redia SAILOR, MD   10 mg at 04/09/23 9383   ondansetron  (ZOFRAN ) tablet 4 mg  4 mg Oral Q6H PRN Smith, Rondell A, MD       Or   ondansetron  (ZOFRAN ) injection 4 mg  4 mg Intravenous Q6H PRN Smith, Rondell A, MD       oxyCODONE  (Oxy IR/ROXICODONE ) immediate release tablet 5 mg  5 mg Oral Q6H PRN Smith, Rondell A, MD   5 mg at 04/09/23 0824   pantoprazole  (PROTONIX ) EC tablet 40 mg  40 mg Oral Daily Smith, Rondell A, MD   40 mg  at 04/09/23 0824   senna (SENOKOT) tablet 17.2 mg  2 tablet Oral Daily Smith, Rondell A, MD   17.2 mg at 04/09/23 9175   sevelamer  carbonate (RENVELA ) tablet 800 mg  800 mg Oral TID WC Smith, Rondell A, MD   800 mg at 04/09/23 9175   sodium chloride  flush (NS) 0.9 % injection 3 mL  3 mL Intravenous Q12H Claudene Reeves A, MD   3 mL at 04/09/23 9175     Discharge Medications: Please see discharge summary for a list of discharge medications.  Relevant Imaging Results:  Relevant Lab Results:   Additional Information SS#: [id number]; HD at [facility] on MWF  Courtney Gray, [place]

## 2023-04-09 NOTE — Progress Notes (Signed)
 HOSPITALIST                ROUNDING                 NOTE Courtney Gray FMW:[id number]  DOB: February 22, 1971  DOA: 04/07/2023  PCP: Default, Provider, MD  04/09/2023,3:00 PM   LOS: 2 days      Code Status: Full From: [facility]   current Dispo: [facility]       53 year old F ESRD on HD TTS,  Hospitalization 11/01/21--05/15/22 for Bacteroides Thetaiotaomicron bacteremia, hepatic and splenic abscess --required amputation of third digit of left hand and underwent left AKA and had splenic drain as well as CT-guided drainage of liver abscess she had a line holiday on last admission on long-term IV antibiotics until 11/08/2021 diastolic CHF,PVD s/p bilateral AKA, IDDM-2, morbid obesity and  chronic sacral wound She was recently hospitalized 12/9-12 18 2024 for sedation and toxic metabolic encephalopathy likely secondary to various meds such as Flexeril /Neurontin  causing encephalopathy in the setting of HD--- it was also felt that this was secondary to hypoglycemia Represented back to the hospital 12/31 and was carrying on a normal conversation she became more lethargic with decreased p.o. intake on 12/24 and her last HD was on 12/24 she was not able to go to HD on Sunday due to the holiday schedule She was afebrile with tachypnea and hypotension MAP less than 65--- potassium 6.0 BUN/creatinine 81/13.5 sodium 132 bicarb 17 Lactic acid 2.4 WBC 8 hemoglobin 11.7 platelets 74  Patient was admitted with acute metabolic encephalopathy   Plan  Toxic metabolic encephalopathy DDx meds with sedating potential versus hypoglycemia Meds minimized -only Klonopin  1 mg twice daily, oxycodone  5 mg every 6 as needed CBG's ranging 70-80 whereas on arrival was in the 40 range which could account for the confusion--this is improved overall  Hypoglycemia in the setting of DM TY 2-A1c recently 12.9 No longer on insulin -risk of encephalopathy too great--- hold coverage and follow-up as an outpatient--- will likely need to have  CBGs before meals Last hospitalization cosyntropin  testing was negative-workup suggestive poor p.o. intake/ polypharmacy as main cause  Fingertip ischemia dry gangrene Lactic acidosis--hypoperfusion related likely Last hospital stay vascular as outpatient arterial duplex without significant stenosis or occlusion-patient was told she may be at high risk for finger amputation wound slook clean--she has made good progress overall--await input from [facility] RN    Chronic hypotension/ESRD TTS--dialyszes usually ~ 3.5 hours Continues on midodrine  10 3 times daily Resume Aranesp , [place]  as per renal--EDW as per nephro Continues on Renagel  1600 3 times daily meals  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 from hypotension as well as passive congestion-LFTs have stabilized some degree and are little bit lower than they were  Hypothyroid Continues on Synthroid  100 mcg-last checked 12/0 9 and TSH was 1.9-outpatient follow-up  Bilateral AKA Calciphylaxis abdominal pannus Abdominal wounds have muhc improved overall--all open areas have healed  Avoid calcium  continue sodium thiosulfate  3 times a week with dialysis and foam dressing to abdominal wounds--updates as per WOC nurse  Previous thrombosis tri-dialysis catheter-4.8X3 0.7 cm calcification right atrium-outpatient follow-up--nothing to do at this time  DVT prophylaxis: SCD  Status is: Inpatient Remains inpatient appropriate because:   Likely can d/c to SNF in am    Subjective:  Coherent Just back from HD No distress no fever no chills    Objective + exam Vitals:   04/09/23 1131 04/09/23 1205 04/09/23 1300 04/09/23 1335  BP: 123/71 126/64 133/63 113/84  Pulse: 69 75  79  Resp:  (!) 21 12 20   Temp:    (!) 97.5 F (36.4 C)  TempSrc:      SpO2: 100% 100%  100%  Weight:    116.2 kg   Filed Weights   04/09/23 0929 04/09/23 1335  Weight: 118.2 kg 116.2 kg    Examination:  Intermittently sleepy EOMI NCAT no focal deficit S1-S2 no murmur no rub  nor gallop Chest clear no wheeze Abdomen soft woudn exam as above Bilateral AKA's noted L hand/finger exam as per above pictures  Data Reviewed: reviewed   CBC    Component Value Date/Time   WBC 7.9 04/09/2023 0947   RBC 3.60 (L) 04/09/2023 0947   HGB 11.5 (L) 04/09/2023 0947   HCT 36.0 04/09/2023 0947   PLT 69 (L) 04/09/2023 0947   MCV 100.0 04/09/2023 0947   MCH 31.9 04/09/2023 0947   MCHC 31.9 04/09/2023 0947   RDW 23.1 (H) 04/09/2023 0947   LYMPHSABS 1.2 04/09/2023 0947   MONOABS 0.8 04/09/2023 0947   EOSABS 0.3 04/09/2023 0947   BASOSABS 0.0 04/09/2023 0947      Latest Ref Rng & Units 04/09/2023    9:47 AM 04/08/2023    9:33 AM 04/08/2023    9:32 AM  CMP  Glucose 70 - 99 mg/dL 76  78    BUN 6 - 20 mg/dL 53  44    Creatinine 9.55 - 1.00 mg/dL 89.90  1.20    Sodium 864 - 145 mmol/L 134  135    Potassium 3.5 - 5.1 mmol/L 4.9  4.9    Chloride 98 - 111 mmol/L 93  96    CO2 22 - 32 mmol/L 21  19    Calcium  8.9 - 10.3 mg/dL 9.5  9.5    Total Protein 6.5 - 8.1 g/dL   7.2   Total Bilirubin 0.0 - 1.2 mg/dL   2.4   Alkaline Phos 38 - 126 U/L   100   AST 15 - 41 U/L   90   ALT 0 - 44 U/L   80     Scheduled Meds:  calcium  gluconate  1 g Intravenous Once   levothyroxine   100 mcg Oral Daily   midodrine   10 mg Oral TID   pantoprazole   40 mg Oral Daily   senna  2 tablet Oral Daily   sevelamer  carbonate  800 mg Oral TID WC   sodium chloride  flush  3 mL Intravenous Q12H   Continuous Infusions:  Time  55  Jai-Gurmukh Brayan Votaw, MD  [facility]

## 2023-04-09 NOTE — Consult Note (Signed)
 WOC consult requested for multiple wounds.  Pt has been in dialysis all day and our team will perform a consult tomorrow.  Thank-you,  Cammie Mcgee MSN, RN, CWOCN, [place], CNS [phone]

## 2023-04-09 NOTE — Plan of Care (Signed)

## 2023-04-10 DIAGNOSIS — Z992 Dependence on renal dialysis: Secondary | ICD-10-CM | POA: Diagnosis not present

## 2023-04-10 DIAGNOSIS — N186 End stage renal disease: Secondary | ICD-10-CM | POA: Diagnosis not present

## 2023-04-10 LAB — CBC
HCT: 37.1 % (ref 36.0–46.0)
Hemoglobin: 12 g/dL (ref 12.0–15.0)
MCH: 31.9 pg (ref 26.0–34.0)
MCHC: 32.3 g/dL (ref 30.0–36.0)
MCV: 98.7 fL (ref 80.0–100.0)
Platelets: 68 10*3/uL — ABNORMAL LOW (ref 150–400)
RBC: 3.76 MIL/uL — ABNORMAL LOW (ref 3.87–5.11)
RDW: 23.2 % — ABNORMAL HIGH (ref 11.5–15.5)
WBC: 8 10*3/uL (ref 4.0–10.5)
nRBC: 1 % — ABNORMAL HIGH (ref 0.0–0.2)

## 2023-04-10 LAB — RENAL FUNCTION PANEL
Albumin: 3.5 g/dL (ref 3.5–5.0)
Anion gap: 17 — ABNORMAL HIGH (ref 5–15)
BUN: 30 mg/dL — ABNORMAL HIGH (ref 6–20)
CO2: 22 mmol/L (ref 22–32)
Calcium: 9.5 mg/dL (ref 8.9–10.3)
Chloride: 94 mmol/L — ABNORMAL LOW (ref 98–111)
Creatinine, Ser: 6.81 mg/dL — ABNORMAL HIGH (ref 0.44–1.00)
GFR, Estimated: 7 mL/min — ABNORMAL LOW (ref >60)
Glucose, Bld: 72 mg/dL (ref 70–99)
Phosphorus: 5.4 mg/dL — ABNORMAL HIGH (ref 2.5–4.6)
Potassium: 4.2 mmol/L (ref 3.5–5.1)
Sodium: 133 mmol/L — ABNORMAL LOW (ref 135–145)

## 2023-04-10 LAB — GLUCOSE, CAPILLARY
Glucose-Capillary: 70 mg/dL (ref 70–99)
Glucose-Capillary: 61 mg/dL — ABNORMAL LOW (ref 70–99)

## 2023-04-10 MED ORDER — SEVELAMER HCL 800 MG PO TABS: 1600.0000 mg | ORAL_TABLET | Freq: Three times a day (TID) | ORAL | Status: DC

## 2023-04-10 MED ORDER — OXYCODONE HCL 5 MG PO TABS: 5.0000 mg | ORAL_TABLET | Freq: Four times a day (QID) | ORAL | 0 refills | Status: DC | PRN

## 2023-04-10 MED ORDER — INSULIN LISPRO 100 UNIT/ML IJ SOLN: INTRAMUSCULAR | Status: DC

## 2023-04-10 MED ORDER — ACETAMINOPHEN 325 MG PO TABS: 650.0000 mg | ORAL_TABLET | Freq: Four times a day (QID) | ORAL | Status: DC | PRN

## 2023-04-10 NOTE — Discharge Summary (Signed)
 Physician Discharge Summary  Courtney Gray FMW:[id number] DOB: 05/16/1970 DOA: 04/07/2023  PCP: Default, Provider, MD  Admit date: 04/07/2023 Discharge date: 04/10/2023  Time spent: 46 minutes  Recommendations for Outpatient Follow-up:  Requires renal panel CBC in 1 week as an outpatient Dose Veltassa  and other meds as per renal Recommend close follow-up of finger and ischial wound and turn as able Recommend very limited sliding scale coverage and ensure that patient is able to follow-up with HD routinely Get iron levels etc. in the outpatient setting as per renal protocol  Discharge Diagnoses:  MAIN problem for hospitalization   Metabolic encephalopathy likely secondary to missed dialysis polypharmacy versus hypoglycemia Multiple wounds now all healing Hypoglycemia Bilateral AKA  Please see below for itemized issues addressed in HOpsital- refer to other progress notes for clarity if needed  Discharge Condition: Improved  Diet recommendation: Renal diabetic  Filed Weights   04/09/23 0929 04/09/23 1335  Weight: 118.2 kg 116.2 kg    History of present illness:   53 year old F ESRD on HD TTS,  Hospitalization 11/01/21--05/15/22 for Bacteroides Thetaiotaomicron bacteremia, hepatic and splenic abscess --required amputation of third digit of left hand and underwent left AKA and had splenic drain as well as CT-guided drainage of liver abscess she had a line holiday on last admission on long-term IV antibiotics until 11/08/2021 diastolic CHF,PVD s/p bilateral AKA, IDDM-2, morbid obesity and  chronic sacral wound She was recently hospitalized 12/9-12 18 2024 for sedation and toxic metabolic encephalopathy likely secondary to various meds such as Flexeril /Neurontin  causing encephalopathy in the setting of HD--- it was also felt that this was secondary to hypoglycemia Represented back to the hospital 12/31 and was carrying on a normal conversation she became more lethargic with decreased  p.o. intake on 12/24 and her last HD was on 12/24 she was not able to go to HD on Sunday due to the holiday schedule She was afebrile with tachypnea and hypotension MAP less than 65--- potassium 6.0 BUN/creatinine 81/13.5 sodium 132 bicarb 17 Lactic acid 2.4 WBC 8 hemoglobin 11.7 platelets 74   Patient was admitted with acute metabolic encephalopathy     Plan   Toxic metabolic encephalopathy DDx meds with sedating potential versus hypoglycemia Meds minimized -only Klonopin  1 mg twice daily, oxycodone  5 mg every 6 as needed and given limited prescription at time of discharge CBG's were in the 40 range on arrival and improved to the 70-80 range and we will need a very restrictive course of insulin  as below   Hypoglycemia in the setting of DM TY 2-A1c recently 12.9 No longer on insulin -risk of encephalopathy too great--- I placed on a very restrictive sliding scale at discharge given the risk of hypoglycemia confusion and morbidity outweighs the benefit of strict glycemic control We may have to tolerate a higher A1c in the near future I would repeat A1c in about a month and adjust meds accordingly  fingertip ischemia dry gangrene Lactic acidosis--hypoperfusion related likely Last hospital stay vascular as outpatient arterial duplex without significant stenosis or occlusion-patient was told she may be at high risk for finger amputation wound slook clean--she has made good progress overall--wound nurse feels that the patient can have the area open or covered with foam dressing per patient comfort and I do not think this is infected This will need to be watched more closely      Chronic hypotension/ESRD TTS--dialyszes usually ~ 3.5 hours Continues on midodrine  10 3 times daily Veltassa  was resumed at discharge, renal gel was adjusted  to 800 3 times daily, will continue on Aranesp  as per nephrology and will need iron studies in about a month   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 from hypotension as well as  passive congestion-LFTs have stabilized some degree and are little bit lower than they were She will need follow-up as I suspect she has also some presumed cirrhosis given her habitus   Hypothyroid Continues on Synthroid  100 mcg-last checked 12/0 9 and TSH was 1.9-outpatient follow-up probably in 3 weeks   Bilateral AKA Calciphylaxis abdominal pannus Abdominal wounds have muhc improved overall--all open areas have healed   calcium  continue sodium thiosulfate  3 times a week with dialysis and foam dressing to abdominal wounds--updates as per WOC nurse  She does have a sacral decubitus ulcer additionally which will need attention and careful turning   Previous thrombosis tri-dialysis catheter-4.8X3 0.7 cm calcification right atrium-outpatient follow-up--nothing to do at this time   Discharge Exam: Vitals:   04/09/23 2139 04/10/23 0631  BP:  (!) 108/48  Pulse: 76 76  Resp:  19  Temp:  (!) 97.5 F (36.4 C)  SpO2: 100% 100%    Subj on day of d/c   Awake coherent no distress looks fair feels all right More awake than she has been No chest pain no fever  General Exam on discharge  EOMI NCAT no focal deficit no icterus no pallor no rales no rhonchi Awake coherent Chest clear ROM intact Wounds examined as per above yesterday  Discharge Instructions   Discharge Instructions     Diet - low sodium heart healthy   Complete by: As directed    Discharge wound care:   Complete by: As directed    04/08/23 0500    Wound care  Daily      Comments: Dressing procedure/placement/frequency: After bathing daily.  Foam dressings to abdominal wounds.  04/07/23 0845   04/07/23 0846    Wound care  Every other day    Comments: Aquacel for protection to right ischial wound (LAWSON # [id number])  cover with foam dressing and change every other day.  Finger can be left open to air or covered with foam dressings per patient comfort.  Change every other day   Increase activity slowly   Complete  by: As directed       Allergies as of 04/10/2023       Reactions   Benzonatate Other (See Comments)   Hallucinations   Hydralazine  Other (See Comments)   Hallucinations   Amoxicillin Itching, Nausea And Vomiting   Clonidine Other (See Comments)   Altered mental state, insomnia    Chlorhexidine  Itching   Patient reports it is the CHG baths that cause her itching not the CHG used for caring for her IV/HD access.     Morphine  Itching        Medication List     STOP taking these medications    dicyclomine  10 MG capsule Commonly known as: BENTYL    famotidine  20 MG tablet Commonly known as: PEPCID    OXYGEN        TAKE these medications    acetaminophen  325 MG tablet Commonly known as: TYLENOL  Take 2 tablets (650 mg total) by mouth every 6 (six) hours as needed for mild pain (pain score 1-3) (or Fever >/= 101).   aspirin  EC 81 MG tablet Take 1 tablet (81 mg total) by mouth daily. Swallow whole.   Darbepoetin Alfa  200 MCG/0.4ML Sosy injection Commonly known as: ARANESP  Inject 200 mcg into the skin once a week.  Every Tuesday   docusate sodium  100 MG capsule Commonly known as: COLACE Take 100 mg by mouth 2 (two) times daily.   insulin  lispro 100 UNIT/ML injection Commonly known as: HumaLOG  CBG >250 give 1 unit, CBG >300 give 2 units, CBG >350 give 4 units and call MD   levothyroxine  100 MCG tablet Commonly known as: SYNTHROID  Take 100 mcg by mouth daily.   lidocaine  5 % Commonly known as: LIDODERM  Place 1 patch onto the skin daily. Remove & Discard patch within 12 hours or as directed by MD   midodrine  10 MG tablet Commonly known as: PROAMATINE  Take 1 tablet (10 mg total) by mouth every Monday, Wednesday, and Friday at 8 PM. Before hemodialysis   oxyCODONE  5 MG immediate release tablet Commonly known as: Oxy IR/ROXICODONE  Take 1 tablet (5 mg total) by mouth every 6 (six) hours as needed for severe pain (pain score 7-10).   pantoprazole  40 MG  tablet Commonly known as: PROTONIX  Take 1 tablet (40 mg total) by mouth daily.   senna 8.6 MG Tabs tablet Commonly known as: SENOKOT Take 2 tablets (17.2 mg total) by mouth daily.   sevelamer  800 MG tablet Commonly known as: RENAGEL  Take 2 tablets (1,600 mg total) by mouth 3 (three) times daily with meals.   sodium chloride  0.9 % SOLN 100 mL with sodium thiosulfate  250 MG/ML SOLN 25 g Inject 25 g into the vein every Monday, Wednesday, and Friday with hemodialysis.   Veltassa  16.8 g Pack Generic drug: Patiromer  Sorbitex Calcium  Take 16.8 g by mouth every Monday, Wednesday, and Friday.               Discharge Care Instructions  (From admission, onward)           Start     Ordered   04/10/23 0000  Discharge wound care:       Comments: 04/08/23 0500    Wound care  Daily      Comments: Dressing procedure/placement/frequency: After bathing daily.  Foam dressings to abdominal wounds.  04/07/23 0845   04/07/23 0846    Wound care  Every other day    Comments: Aquacel for protection to right ischial wound (LAWSON # [id number])  cover with foam dressing and change every other day.  Finger can be left open to air or covered with foam dressings per patient comfort.  Change every other day   04/10/23 9061           Allergies  Allergen Reactions   Benzonatate Other (See Comments)    Hallucinations   Hydralazine  Other (See Comments)    Hallucinations   Amoxicillin Itching and Nausea And Vomiting   Clonidine Other (See Comments)    Altered mental state, insomnia    Chlorhexidine  Itching    Patient reports it is the CHG baths that cause her itching not the CHG used for caring for her IV/HD access.     Morphine  Itching      The results of significant diagnostics from this hospitalization (including imaging, microbiology, ancillary and laboratory) are listed below for reference.    Significant Diagnostic Studies: CT HEAD WO CONTRAST ( ) Result Date:  04/07/2023 CLINICAL DATA:  Delirium EXAM: CT HEAD WITHOUT CONTRAST TECHNIQUE: Contiguous axial images were obtained from the base of the skull through the vertex without intravenous contrast. RADIATION DOSE REDUCTION: This exam was performed according to the departmental dose-optimization program which includes automated exposure control, adjustment of the mA and/or kV according to patient size and/or use  of iterative reconstruction technique. COMPARISON:  03/16/2023 FINDINGS: Brain: No evidence of acute infarction, hemorrhage, hydrocephalus, extra-axial collection or mass lesion/mass effect. Calcification along the right sylvian fissure that was also seen on prior and is nonspecific in isolation. No associated infarct to imply arterial embolic disease. Vascular: No hyperdense vessel. Extensive arterial calcification in the skull base and scalp. Skull: No acute or aggressive finding. Sinuses/Orbits: No emergent finding IMPRESSION: No acute or interval finding. Electronically Signed   By: Dorn Roulette M.D.   On: 04/07/2023 04:45   DG Chest Port 1 View Result Date: 04/07/2023 CLINICAL DATA:  Altered mental status.  Shortness of breath. EXAM: PORTABLE CHEST 1 VIEW COMPARISON:  Radiograph 03/16/2023 FINDINGS: Left-sided dialysis catheter unchanged in position. Cardiomegaly is unchanged. Stable mediastinal contours. Worsening hazy perihilar opacities suspicious for edema. Possible pleural effusions, not well assessed due to habitus. No pneumothorax. IMPRESSION: Cardiomegaly with worsening hazy perihilar opacities suspicious for edema. Possible pleural effusions. Electronically Signed   By: Andrea Gasman M.D.   On: 04/07/2023 04:25   VAS US  UPPER EXTREMITY ARTERIAL DUPLEX Result Date: 03/21/2023  UPPER EXTREMITY DUPLEX STUDY Patient Name:  Courtney Gray  Date of Exam:   03/20/2023 Medical Rec #: [id number]        Accession #:    [id number] Date of Birth: [birth year]         Patient Gender: F Patient Age:   72  years Exam Location:  [facility] Procedure:      VAS US  UPPER EXTREMITY ARTERIAL DUPLEX Referring Phys: BURGESS DARE --------------------------------------------------------------------------------  Indications: Concern for vascular compromise. History:     Patient has a history of ischemic fingertips of RUE.  Risk Factors:  Hypertension, hyperlipidemia, Diabetes, no history of smoking,                prior MI, coronary artery disease. Other Factors: ESRD (HD), PAD (BLE AKA), CHF. LUE middle finger amputation Limitations: Patient very uncooperative. Patient sitting up (poor positioning),              falling asleep and continuously moving arms. Heavy arterial              calcification limiting visualization of vessels. Comparison Study: Previous RUE arterial duplex on 12/03/21 & LUE arterial duplex                   on 08/11/21. Performing Technologist: Ezzie Potters RVT, RDMS  Examination Guidelines: A complete evaluation includes B-mode imaging, spectral Doppler, color Doppler, and power Doppler as needed of all accessible portions of each vessel. Bilateral testing is considered an integral part of a complete examination. Limited examinations for reoccurring indications may be performed as noted.  Right Doppler Findings: +---------------+----------+----------+--------+--------+ Site           PSV (cm/s)Waveform  StenosisComments +---------------+----------+----------+--------+--------+ Subclavian Prox39        biphasic                   +---------------+----------+----------+--------+--------+ Subclavian Mid 32        monophasic                 +---------------+----------+----------+--------+--------+ Subclavian Dist          monophasic                 +---------------+----------+----------+--------+--------+ Axillary       31        biphasic                   +---------------+----------+----------+--------+--------+  Brachial Prox  34        biphasic                    +---------------+----------+----------+--------+--------+ Brachial Dist  34        biphasic                   +---------------+----------+----------+--------+--------+ Radial Prox    18        monophasic                 +---------------+----------+----------+--------+--------+ Radial Mid     9         monophasic                 +---------------+----------+----------+--------+--------+ Radial Dist    11        monophasic                 +---------------+----------+----------+--------+--------+ Ulnar Prox     45        monophasic                 +---------------+----------+----------+--------+--------+ Ulnar Mid      42        monophasic                 +---------------+----------+----------+--------+--------+ Ulnar Dist     42        monophasic                 +---------------+----------+----------+--------+--------+ Palmar Arch    13        monophasic                 +---------------+----------+----------+--------+--------+    Left Doppler Findings: +---------------+----------+----------+--------+-------------------------------+ Site           PSV (cm/s)Waveform  StenosisComments                        +---------------+----------+----------+--------+-------------------------------+ Subclavian Prox                            not visualized - HD             +---------------+----------+----------+--------+-------------------------------+ Subclavian Mid 23        monophasic                                        +---------------+----------+----------+--------+-------------------------------+ Axillary       27        monophasic                                        +---------------+----------+----------+--------+-------------------------------+ Brachial Prox  41        monophasic                                        +---------------+----------+----------+--------+-------------------------------+ Brachial Dist  31        monophasic                                         +---------------+----------+----------+--------+-------------------------------+ Radial Prox    40  monophasic                                        +---------------+----------+----------+--------+-------------------------------+ Radial Mid                                 IV palcement - not visualized   +---------------+----------+----------+--------+-------------------------------+ Radial Dist    45        monophasic                                        +---------------+----------+----------+--------+-------------------------------+ Ulnar Prox                                 unable to insonate - poor                                                  positioning                     +---------------+----------+----------+--------+-------------------------------+ Ulnar Mid      9         monophasic                                        +---------------+----------+----------+--------+-------------------------------+ Ulnar Dist     21        monophasic                                        +---------------+----------+----------+--------+-------------------------------+ Palmar Arch                                unable to insonate              +---------------+----------+----------+--------+-------------------------------+ Very limited due to patient turned away / leaning forward. Continuous movement of arm due to patient falling in and out of sleep.  Summary:  Bilateral: No obvious evidence of significant stenosis or occlusion,            however very difficult and limited study due to heavy            arterial calcification and patient condition. *See table(s) above for measurements and observations. Electronically signed by Debby Robertson on 03/21/2023 at 12:30:47 PM.    Final    CT CHEST WO CONTRAST Result Date: 03/17/2023 CLINICAL DATA:  Left-greater-than-right basilar airspace opacities on chest x-ray today, sent to ED  with altered mental status and weakness. EXAM: CT CHEST WITHOUT CONTRAST TECHNIQUE: Multidetector CT imaging of the chest was performed following the standard protocol without IV contrast. RADIATION DOSE REDUCTION: This exam was performed according to the departmental dose-optimization program which includes automated exposure control, adjustment of the mA and/or kV according to patient size and/or use of iterative reconstruction technique. COMPARISON:  Portable chest today, portable chest 11/05/2022, CTA chest 09/09/2022, CTA chest  09/12/2021. FINDINGS: Cardiovascular: The heart is moderately enlarged, or patent chamber involvement and heavy three-vessel calcific CAD. Left IJ approach hemodialysis catheter again terminates in the right atrium. There is no substantial pericardial effusion. There is a mass with patchy calcifications in the right atrium laterally measuring 4.8 x 3.7 cm. It is unchanged in size since 09/12/2021 and was obscured by right atrial contrast on the last CTA. The mass has become progressively calcified since 2023 and probably is a dialysis catheter-related partially calcified thrombus. If further evaluation is needed it would be best done with either echocardiography or cardiac MRI. Calcified linear thrombus extends along the course of the hemodialysis catheter in the SVC. The pulmonary trunk is 3.2 cm indicating arterial hypertension. Pulmonary veins are nondistended. The thoracic aorta is heavily calcified with calcification extending across the valve leaflets and mild tortuosity. There is no aortic aneurysm. There are calcifications in the great vessels. Calcifications at the origins of the left subclavian and left common carotid arteries likely causing flow-limiting origin stenoses with additional heavy calcification and likely stenosis at the right subclavian artery origin. Mediastinum/Nodes: Single slightly prominent right paratracheal space lymph node 1.1 cm in short axis. No new or  progressive or further adenopathy is seen. There is no thyroid mass. Thoracic esophagus and thoracic trachea are unremarkable. Lungs/Pleura: There is a mildly, chronically elevated right hemidiaphragm. No pleural effusion, thickening or pneumothorax. There is mild bronchial thickening. Chronic scarring in the right middle lobe base, lateral basal right lower lobe. There is increased faint ground-glass opacity in the upper and lower lobes, which is probably due to mosaicism and air trapping, less likely pneumonitis. No focal consolidation is seen, no pulmonary nodule. Upper Abdomen: Aortic and branch vessel atherosclerosis. No acute findings. Musculoskeletal: Degenerative changes and slight dextroscoliosis thoracic spine. Dense bones consistent with renal osteodystrophy. No chest wall mass is seen. IMPRESSION: 1. Increased faint ground-glass opacity in the upper and lower lobes, probably due to mosaicism and air trapping, less likely pneumonitis. 2. Mild bronchial thickening. 3. Cardiomegaly with heavy three-vessel calcific CAD. 4. 4.8 x 3.7 cm mass with patchy calcifications in the right atrium, unchanged in size since 09/12/2021 and probably a dialysis catheter-related partially calcified thrombus. If further evaluation is needed it would be best done with either echocardiography or cardiac MRI. 5. Calcified linear thrombus extends along the course of the hemodialysis catheter in the SVC. 6. Aortic and branch vessel atherosclerosis. Likely flow-limiting stenoses of both subclavian arteries, left common carotid artery. 7. Single slightly prominent right paratracheal space lymph node. No new or progressive adenopathy. 8. Dense bones consistent with renal osteodystrophy. Aortic Atherosclerosis (ICD10-I70.0). Electronically Signed   By: Francis Quam M.D.   On: 03/17/2023 01:10   CT Head Wo Contrast Result Date: 03/16/2023 CLINICAL DATA:  Mental status change, unknown cause. Speech disturbance. Lethargy/weakness.  EXAM: CT HEAD WITHOUT CONTRAST TECHNIQUE: Contiguous axial images were obtained from the base of the skull through the vertex without intravenous contrast. RADIATION DOSE REDUCTION: This exam was performed according to the departmental dose-optimization program which includes automated exposure control, adjustment of the mA and/or kV according to patient size and/or use of iterative reconstruction technique. COMPARISON:  Head CT 09/27/2021 FINDINGS: Brain: There is no evidence of an acute infarct, intracranial hemorrhage, mass, midline shift, or extra-axial fluid collection. The ventricles and sulci are normal for age. Vascular: Calcified atherosclerosis at the skull base. Unchanged small calcification posteriorly in the right sylvian fissure, possibly a chronically calcified embolus within a distal right MCA  branch vessel. Skull: No acute fracture or suspicious osseous lesion. Sinuses/Orbits: Partially visualized mild mucosal thickening in the maxillary sinuses. Clear mastoid air cells. Unremarkable orbits. Other: None. IMPRESSION: No evidence of acute intracranial abnormality. Electronically Signed   By: Dasie Hamburg M.D.   On: 03/16/2023 14:27   DG Chest Portable 1 View Result Date: 03/16/2023 CLINICAL DATA:  Shortness of breath. EXAM: PORTABLE CHEST 1 VIEW COMPARISON:  Radiographs 11/05/2022 and 09/08/2022.  CT 09/09/2022. FINDINGS: 1039 hours. The left lung base is partially excluded from this portable examination. Allowing for this, the heart size and mediastinal contours are stable with cardiomegaly, aortic atherosclerosis and stable widening of the superior mediastinum. Left IJ hemodialysis catheter extends to the mid right atrium, stable. Lower lung volumes with new patchy left greater than right basilar airspace opacities. No large pleural effusion or pneumothorax identified. The bones appear unchanged. IMPRESSION: Lower lung volumes with new patchy left greater than right basilar airspace opacities,  likely atelectasis. Left lower lobe pneumonia not excluded. Radiographic follow-up recommended. Electronically Signed   By: Elsie Perone M.D.   On: 03/16/2023 10:56    Microbiology: Recent Results (from the past 240 hours)  Blood culture (routine x 2)     Status: None (Preliminary result)   Collection Time: 04/07/23  3:14 AM   Specimen: BLOOD  Result Value Ref Range Status   Specimen Description BLOOD BLOOD LEFT ARM  Final   Special Requests   Final    BOTTLES DRAWN AEROBIC ONLY Blood Culture adequate volume   Culture   Final    NO GROWTH 3 DAYS Performed at [facility], [address], [place], [place] [address]    Report Status PENDING  Incomplete  Blood culture (routine x 2)     Status: None (Preliminary result)   Collection Time: 04/07/23  3:14 AM   Specimen: BLOOD  Result Value Ref Range Status   Specimen Description BLOOD BLOOD RIGHT HAND  Final   Special Requests   Final    BOTTLES DRAWN AEROBIC ONLY Blood Culture adequate volume   Culture   Final    NO GROWTH 3 DAYS Performed at [facility], [address], [place], [place] [address]    Report Status PENDING  Incomplete  Resp panel by RT-PCR (RSV, Flu A&B, Covid) Anterior Nasal Swab     Status: None   Collection Time: 04/07/23  4:07 AM   Specimen: Anterior Nasal Swab  Result Value Ref Range Status   SARS Coronavirus 2 by RT PCR NEGATIVE NEGATIVE Final   Influenza A by PCR NEGATIVE NEGATIVE Final   Influenza B by PCR NEGATIVE NEGATIVE Final    Comment: (NOTE) The Xpert Xpress SARS-CoV-2/FLU/RSV plus assay is intended as an aid in the diagnosis of influenza from Nasopharyngeal swab specimens and should not be used as a sole basis for treatment. Nasal washings and aspirates are unacceptable for Xpert Xpress SARS-CoV-2/FLU/RSV testing.  Fact Sheet for Patients: bloggercourse.com  Fact Sheet for Healthcare Providers: seriousbroker.it  This test is  not yet approved or cleared by the [place]  FDA and has been authorized for detection and/or diagnosis of SARS-CoV-2 by FDA under an Emergency Use Authorization (EUA). This EUA will remain in effect (meaning this test can be used) for the duration of the COVID-19 declaration under Section 564(b)(1) of the Act, 21 U.S.C. section 360bbb-3(b)(1), unless the authorization is terminated or revoked.     Resp Syncytial Virus by PCR NEGATIVE NEGATIVE Final  Comment: (NOTE) Fact Sheet for Patients: bloggercourse.com  Fact Sheet for Healthcare Providers: seriousbroker.it  This test is not yet approved or cleared by the [place]  FDA and has been authorized for detection and/or diagnosis of SARS-CoV-2 by FDA under an Emergency Use Authorization (EUA). This EUA will remain in effect (meaning this test can be used) for the duration of the COVID-19 declaration under Section 564(b)(1) of the Act, 21 U.S.C. section 360bbb-3(b)(1), unless the authorization is terminated or revoked.  Performed at [facility], [address], [place], [place] [address]      Labs: Basic Metabolic Panel: Recent Labs  Lab 04/07/23 0313 04/07/23 0321 04/08/23 0933 04/09/23 0947  NA 132* 131* 135 134*  K 6.0* 5.8* 4.9 4.9  CL 94* 100 96* 93*  CO2 17*  --  19* 21*  GLUCOSE 69* 67* 78 76  BUN 81* 71* 44* 53*  CREATININE 13.58* 13.90* 8.79* 10.09*  CALCIUM  9.3  --  9.5 9.5  PHOS  --   --  6.6* 7.5*   Liver Function Tests: Recent Labs  Lab 04/07/23 0313 04/08/23 0932 04/08/23 0933 04/09/23 0947  AST 107* 90*  --   --   ALT 82* 80*  --   --   ALKPHOS 102 100  --   --   BILITOT 2.0* 2.4*  --   --   PROT 7.5 7.2  --   --   ALBUMIN  3.5 3.4* 3.4* 3.2*   No results for input(s): LIPASE, AMYLASE in the last 168 hours. No results for input(s): AMMONIA in the last 168 hours. CBC: Recent Labs  Lab 04/07/23 0313 04/07/23 0321  04/08/23 0932 04/09/23 0947  WBC 8.0  --  7.7 7.9  NEUTROABS 5.8  --   --  5.5  HGB 11.7* 14.3 11.9* 11.5*  HCT 35.8* 42.0 37.4 36.0  MCV 97.0  --  98.9 100.0  PLT 74*  --  70* 69*   Cardiac Enzymes: No results for input(s): CKTOTAL, CKMB, CKMBINDEX, TROPONINI in the last 168 hours. BNP: BNP (last 3 results) Recent Labs    09/09/22 0231 11/05/22 1041  BNP 943.5* 1,832.9*    ProBNP (last 3 results) No results for input(s): PROBNP in the last 8760 hours.  CBG: Recent Labs  Lab 04/08/23 2102 04/09/23 0605 04/09/23 1736 04/09/23 2140 04/10/23 0858  GLUCAP 79 101* 74 75 70    Signed:  Colen Grimes MD   [facility] 04/10/2023, 9:38 AM

## 2023-04-10 NOTE — TOC Transition Note (Signed)
 Transition of Care [facility]) - Discharge Note   Patient Details  Name: Courtney Gray MRN: [id number] Date of Birth: [birth year]  [facility]) CM/SW Contact:  Harlene Sharps, LCSW Phone Number: 04/10/2023, 10:33 AM   Clinical Narrative:    Pt to be transported to [facility] via [place]. Nurse to call report to [phone]. Pt to receive HD OP at 11:15 on 1/4. Facility to transport.    Final next level of care: Skilled Nursing Facility Barriers to Discharge: Barriers Resolved   Patient Goals and CMS Choice Patient states their goals for this hospitalization and ongoing recovery are:: find a new SNF CMS Medicare.gov Compare Post Acute Care list provided to:: Patient Represenative (must comment) Choice offered to / list presented to : Adult Children [place] ownership interest in [facility].provided to:: Adult Children    Discharge Placement              Patient chooses bed at:  [facility]) Patient to be transferred to facility by: [facility] Name of family member notified: Shironda Kain Patient and family notified of of transfer: 04/10/23  Discharge Plan and Services Additional resources added to the After Visit Summary for       Post Acute Care Choice: Skilled Nursing Facility                               Social Drivers of Health (SDOH) Interventions SDOH Screenings   Food Insecurity: No Food Insecurity (04/07/2023)  Housing: Low Risk  (04/07/2023)  Transportation Needs: No Transportation Needs (04/07/2023)  Utilities: Not At Risk (04/07/2023)  Financial Resource Strain: High Risk (03/27/2018)   Received from [facility], [facility]  Social Connections: Unknown (08/20/2021)   Received from [facility], [facility]  Tobacco Use: Low Risk  (04/07/2023)  Health Literacy: Low Risk  (11/25/2022)   Received from [facility]     Readmission Risk Interventions    09/16/2021    4:36 PM 08/28/2021   11:50 AM 07/12/2021    12:38 PM  Readmission Risk Prevention Plan  Transportation Screening Complete Complete Complete  Medication Review (RN Care Manager)  Complete Complete  PCP or Specialist appointment within 3-5 days of discharge  Complete Complete  HRI or Home Care Consult Complete Complete Complete  SW Recovery Care/Counseling Consult Complete Complete Complete  Palliative Care Screening  Not Applicable Not Applicable  Skilled Nursing Facility Complete Not Applicable Complete

## 2023-04-10 NOTE — Care Management Important Message (Signed)
 Important Message  Patient Details  Name: Courtney Gray MRN: [id number] Date of Birth: [birth year]   Important Message Given:  Yes - Medicare IM  Patient left prior to IM delivery will mail a copy to the patient home address.    Kirin Brandenburger 04/10/2023, 4:30 PM

## 2023-04-10 NOTE — Consult Note (Signed)
 WOC Nurse Consult Note: Reason for Consult: Requested to assess buttock ans fingers. Wound type: full thickness on both index fingers. Right side below the nail, painfull when touch. Left side signs of gangrene. Pressure injury infected on the right buttock. Pressure Injury POA: Yes Measurement: PI buttock 5x4x4cm Wound bed: 70% red, 30% yellow Drainage (amount, consistency, odor) medium amount, presence of odor, yellow/pus. Painfull when touch. Periwound: macerated Dressing procedure/placement/frequency: Apply Aquacel ([id number]) on the wound bed, cover with foam dressing, change daily.  Keep the fingers open and dry.   Obs: Discussed with the patient the possibility to cover the right finger with a hydrogel dressing to debrided the crust on the wound or leave dry until further consult of the medical team. Was choose the second option. Pt is confuse with her diagnosis as a diabetic, because she is not taking any medication in a year, she is having hypoglycemic episodes, and she needs more information about her condition, because se is afraid of losing the right index finger, because the process when she loose the last finger was the same.  WOC team will not plan to follow further.  Please reconsult if further assistance is needed. Thank-you,  Lela Holm BSN, RN, [employer], WOC  (Pager: [phone])

## 2023-04-10 NOTE — Progress Notes (Signed)
 [facility] Progress Note   Subjective:  Seen in room. Daughter at bedside. Concerns about  ongoing memory deficits, doesn't like her current facility. Appears plans for discharge today. Completed dialysis yesterday.   Objective Vitals:   04/09/23 1501 04/09/23 2138 04/09/23 2139 04/10/23 0631  BP: (!) 145/61 96/72  (!) 108/48  Pulse: 78 78 76 76  Resp: 17 18  19   Temp: (!) 97.4 F (36.3 C) 97.9 F (36.6 C)  (!) 97.5 F (36.4 C)  TempSrc: Oral Oral  Oral  SpO2:  97% 100% 100%  Weight:       Physical Exam General: Chronically ill appearing female, slow to respond but NAD Heart: RRR, no murmurs, rubs or gallops Lungs: CTA bilaterally, respirations unlabored on RA Abdomen: Soft, non-distended  Extremities: b/l AKAs Dialysis Access: [facility]  Additional Objective Labs: Basic Metabolic Panel: Recent Labs  Lab 04/08/23 0933 04/09/23 0947 04/10/23 0857  NA 135 134* 133*  K 4.9 4.9 4.2  CL 96* 93* 94*  CO2 19* 21* 22  GLUCOSE 78 76 72  BUN 44* 53* 30*  CREATININE 8.79* 10.09* 6.81*  CALCIUM  9.5 9.5 9.5  PHOS 6.6* 7.5* 5.4*   Liver Function Tests: Recent Labs  Lab 04/07/23 0313 04/08/23 0932 04/08/23 0933 04/09/23 0947 04/10/23 0857  AST 107* 90*  --   --   --   ALT 82* 80*  --   --   --   ALKPHOS 102 100  --   --   --   BILITOT 2.0* 2.4*  --   --   --   PROT 7.5 7.2  --   --   --   ALBUMIN  3.5 3.4* 3.4* 3.2* 3.5   No results for input(s): LIPASE, AMYLASE in the last 168 hours. CBC: Recent Labs  Lab 04/07/23 0313 04/07/23 0321 04/08/23 0932 04/09/23 0947 04/10/23 0857  WBC 8.0  --  7.7 7.9 8.0  NEUTROABS 5.8  --   --  5.5  --   HGB 11.7*   < > 11.9* 11.5* 12.0  HCT 35.8*   < > 37.4 36.0 37.1  MCV 97.0  --  98.9 100.0 98.7  PLT 74*  --  70* 69* 68*   < > = values in this interval not displayed.   Blood Culture    Component Value Date/Time   SDES BLOOD BLOOD LEFT ARM 04/07/2023 0314   SDES BLOOD BLOOD RIGHT HAND 04/07/2023 0314    SPECREQUEST  04/07/2023 0314    BOTTLES DRAWN AEROBIC ONLY Blood Culture adequate volume   SPECREQUEST  04/07/2023 0314    BOTTLES DRAWN AEROBIC ONLY Blood Culture adequate volume   CULT  04/07/2023 0314    NO GROWTH 3 DAYS Performed at [facility], [address], [place], [place] [address]    CULT  04/07/2023 0314    NO GROWTH 3 DAYS Performed at [facility], [address], [place], [place] [address]    REPTSTATUS PENDING 04/07/2023 0314   REPTSTATUS PENDING 04/07/2023 0314    Cardiac Enzymes: No results for input(s): CKTOTAL, CKMB, CKMBINDEX, TROPONINI in the last 168 hours. CBG: Recent Labs  Lab 04/08/23 2102 04/09/23 0605 04/09/23 1736 04/09/23 2140 04/10/23 0858  GLUCAP 79 101* 74 75 70   Iron Studies: No results for input(s): IRON, TIBC, TRANSFERRIN, FERRITIN in the last 72 hours. @lablastinr3 @ Studies/Results: No results found.  Medications:   calcium  gluconate  1 g Intravenous Once   levothyroxine   100  mcg Oral Daily   midodrine   10 mg Oral TID   pantoprazole   40 mg Oral Daily   senna  2 tablet Oral Daily   sevelamer  carbonate  800 mg Oral TID WC   sodium chloride  flush  3 mL Intravenous Q12H    Dialysis Orders: Center: [facility] MWF 4hr 2/2 bath EDW 118.5kg 400 1.5 180NRe  Assessment/Plan:  AMS - possibly due to hypotension, uremia (due to missed HD), and polypharmacy.  Seems somewhat improved post HD. Noted asterixis and has high dose gabapentin  on med list- this was stopped last admission and she is not sure if she was taking it again. Pt should not take gabapentin  or lyrica   ESRD -  Has been noncompliant with HD and has missed a whole week. Usual HD MWF. Had HD here Thursday. Has discharge orders today. Plan to resume HD Saturday at [facility] Clinic. Awaiting confirmation of transportation to clinic.   Hypertension/volume  - chronic hypotension.  Started on midodrine  10mg  TID, follow trend  Anemia  -  Hgb at goal, no ESA indicated  at this time  Metabolic bone disease -  Calcium  controlled, will check phos with next labs. continue with home meds  Nutrition -  renal diet, carb modified  Fingertip ischemia - high risk for amputation.   Maisie Ronnald Acosta PA-C [facility] 04/10/2023,10:25 AM

## 2023-04-10 NOTE — Discharge Planning (Signed)
 [facility] Patient Discharge Orders- [facility] CLINIC: [facility]  Patient's name: Courtney Gray Admit/DC Dates: 04/07/2023 - 04/10/2023  Discharge Diagnoses: Acute encephalopathy - Hypoglycemia vs. Polypharmacy. Meds adjusted  Fingertip ischemia. Cont wound care.   Recent Labs  Lab 04/10/23 0857  HGB 12.0  K 4.2  CALCIUM  9.5  PHOS 5.4*  ALBUMIN  3.5   Aranesp : Given: --   Date of last dose/amount: --   PRBC's Given: -- Date/# of units: -- Mircera: Per protocol  Outpatient Dialysis Orders:  -Heparin : No change  -EDW No change   -Bath: No change  Access intervention/Change: --  IV Antibiotics: --  Anticoagulation: --   OTHER/APPTS/LABS    Completed by: Maisie Ronnald Acosta PA-C   D/C Meds to be reconciled by nurse after every discharge.    Reviewed by: MD:______ RN_______

## 2023-04-10 NOTE — Plan of Care (Signed)

## 2023-04-10 NOTE — Plan of Care (Signed)
 A/Ox4. On 3L home oxygen . Daughter at bedside and helpful in care overnight. Able to sit up and turn self in bed. Oxy x2, klonopin  x1 given prn.   Problem: Education: Goal: Ability to describe self-care measures that may prevent or decrease complications (Diabetes Survival Skills Education) will improve Outcome: Adequate for Discharge Goal: Individualized Educational Video(s) Outcome: Adequate for Discharge   Problem: Coping: Goal: Ability to adjust to condition or change in health will improve Outcome: Adequate for Discharge   Problem: Fluid Volume: Goal: Ability to maintain a balanced intake and output will improve Outcome: Adequate for Discharge   Problem: Health Behavior/Discharge Planning: Goal: Ability to identify and utilize available resources and services will improve Outcome: Adequate for Discharge Goal: Ability to manage health-related needs will improve Outcome: Adequate for Discharge   Problem: Metabolic: Goal: Ability to maintain appropriate glucose levels will improve Outcome: Adequate for Discharge   Problem: Nutritional: Goal: Maintenance of adequate nutrition will improve Outcome: Adequate for Discharge Goal: Progress toward achieving an optimal weight will improve Outcome: Adequate for Discharge   Problem: Skin Integrity: Goal: Risk for impaired skin integrity will decrease Outcome: Adequate for Discharge   Problem: Tissue Perfusion: Goal: Adequacy of tissue perfusion will improve Outcome: Adequate for Discharge   Problem: Education: Goal: Knowledge of General Education information will improve Description: Including pain rating scale, medication(s)/side effects and non-pharmacologic comfort measures Outcome: Adequate for Discharge   Problem: Health Behavior/Discharge Planning: Goal: Ability to manage health-related needs will improve Outcome: Adequate for Discharge   Problem: Clinical Measurements: Goal: Ability to maintain clinical measurements  within normal limits will improve Outcome: Adequate for Discharge Goal: Will remain free from infection Outcome: Adequate for Discharge Goal: Diagnostic test results will improve Outcome: Adequate for Discharge Goal: Respiratory complications will improve Outcome: Adequate for Discharge Goal: Cardiovascular complication will be avoided Outcome: Adequate for Discharge   Problem: Activity: Goal: Risk for activity intolerance will decrease Outcome: Adequate for Discharge   Problem: Nutrition: Goal: Adequate nutrition will be maintained Outcome: Adequate for Discharge   Problem: Coping: Goal: Level of anxiety will decrease Outcome: Adequate for Discharge   Problem: Elimination: Goal: Will not experience complications related to bowel motility Outcome: Adequate for Discharge Goal: Will not experience complications related to urinary retention Outcome: Adequate for Discharge   Problem: Pain Management: Goal: General experience of comfort will improve Outcome: Adequate for Discharge   Problem: Safety: Goal: Ability to remain free from injury will improve Outcome: Adequate for Discharge   Problem: Skin Integrity: Goal: Risk for impaired skin integrity will decrease Outcome: Adequate for Discharge

## 2023-04-10 NOTE — Progress Notes (Signed)
 Pt to d/c today and needs out-pt HD tomorrow at clinic on off-day (pt is off normal schedule) per renal PA. [facility] can treat pt tomorrow and pt will need to arrive at 11:15 am. CSW confirmed with snf that snf can transport to HD clinic tomorrow at above time. Appt added to AVS. Clinic aware pt will d/c today and will be at appt tomorrow.   Randine Mungo Renal Navigator [phone]

## 2023-04-10 NOTE — Progress Notes (Signed)
 Pt d/c to [facility] by [employer]. Daughter packed her belongings. HD catheter remains. WOC nurse did dressing changes today. Report was called to LPN at facility. HD tomorrow is set up.

## 2023-04-12 LAB — CULTURE, BLOOD (ROUTINE X 2)
Culture: NO GROWTH
Culture: NO GROWTH
Special Requests: ADEQUATE
Special Requests: ADEQUATE

## 2023-04-15 ENCOUNTER — Emergency Department (HOSPITAL_COMMUNITY): Payer: 59

## 2023-04-15 ENCOUNTER — Emergency Department (HOSPITAL_COMMUNITY)
Admission: EM | Admit: 2023-04-15 | Discharge: 2023-04-16 | Disposition: A | Discharge status: Skilled Nursing Facility | Payer: 59 | Attending: Emergency Medicine | Admitting: Emergency Medicine

## 2023-04-15 ENCOUNTER — Other Ambulatory Visit: Payer: Self-pay

## 2023-04-15 ENCOUNTER — Encounter (HOSPITAL_COMMUNITY): Payer: Self-pay | Admitting: Emergency Medicine

## 2023-04-15 DIAGNOSIS — Z7982 Long term (current) use of aspirin: Secondary | ICD-10-CM | POA: Insufficient documentation

## 2023-04-15 DIAGNOSIS — E1122 Type 2 diabetes mellitus with diabetic chronic kidney disease: Secondary | ICD-10-CM | POA: Insufficient documentation

## 2023-04-15 DIAGNOSIS — N186 End stage renal disease: Secondary | ICD-10-CM | POA: Insufficient documentation

## 2023-04-15 DIAGNOSIS — R238 Other skin changes: Secondary | ICD-10-CM

## 2023-04-15 DIAGNOSIS — Z794 Long term (current) use of insulin: Secondary | ICD-10-CM | POA: Insufficient documentation

## 2023-04-15 DIAGNOSIS — Z992 Dependence on renal dialysis: Secondary | ICD-10-CM | POA: Insufficient documentation

## 2023-04-15 DIAGNOSIS — I120 Hypertensive chronic kidney disease with stage 5 chronic kidney disease or end stage renal disease: Secondary | ICD-10-CM | POA: Insufficient documentation

## 2023-04-15 DIAGNOSIS — Z79899 Other long term (current) drug therapy: Secondary | ICD-10-CM | POA: Insufficient documentation

## 2023-04-15 DIAGNOSIS — L989 Disorder of the skin and subcutaneous tissue, unspecified: Secondary | ICD-10-CM | POA: Insufficient documentation

## 2023-04-15 LAB — BASIC METABOLIC PANEL
Anion gap: 16 — ABNORMAL HIGH (ref 5–15)
BUN: 38 mg/dL — ABNORMAL HIGH (ref 6–20)
CO2: 23 mmol/L (ref 22–32)
Calcium: 9.1 mg/dL (ref 8.9–10.3)
Chloride: 95 mmol/L — ABNORMAL LOW (ref 98–111)
Creatinine, Ser: 7.79 mg/dL — ABNORMAL HIGH (ref 0.44–1.00)
GFR, Estimated: 6 mL/min — ABNORMAL LOW (ref >60)
Glucose, Bld: 110 mg/dL — ABNORMAL HIGH (ref 70–99)
Potassium: 4.4 mmol/L (ref 3.5–5.1)
Sodium: 134 mmol/L — ABNORMAL LOW (ref 135–145)

## 2023-04-15 LAB — CBC WITH DIFFERENTIAL/PLATELET
Abs Immature Granulocytes: 0.15 10*3/uL — ABNORMAL HIGH (ref 0.00–0.07)
Basophils Absolute: 0 10*3/uL (ref 0.0–0.1)
Basophils Relative: 0 %
Eosinophils Absolute: 0.1 10*3/uL (ref 0.0–0.5)
Eosinophils Relative: 0 %
HCT: 35.4 % — ABNORMAL LOW (ref 36.0–46.0)
Hemoglobin: 11.3 g/dL — ABNORMAL LOW (ref 12.0–15.0)
Immature Granulocytes: 1 %
Lymphocytes Relative: 6 %
Lymphs Abs: 0.8 10*3/uL (ref 0.7–4.0)
MCH: 32.5 pg (ref 26.0–34.0)
MCHC: 31.9 g/dL (ref 30.0–36.0)
MCV: 101.7 fL — ABNORMAL HIGH (ref 80.0–100.0)
Monocytes Absolute: 1.2 10*3/uL — ABNORMAL HIGH (ref 0.1–1.0)
Monocytes Relative: 9 %
Neutro Abs: 10.9 10*3/uL — ABNORMAL HIGH (ref 1.7–7.7)
Neutrophils Relative %: 84 %
Platelets: 45 10*3/uL — ABNORMAL LOW (ref 150–400)
RBC: 3.48 MIL/uL — ABNORMAL LOW (ref 3.87–5.11)
RDW: 23.9 % — ABNORMAL HIGH (ref 11.5–15.5)
WBC: 13.1 10*3/uL — ABNORMAL HIGH (ref 4.0–10.5)
nRBC: 0.8 % — ABNORMAL HIGH (ref 0.0–0.2)

## 2023-04-15 MED ORDER — MUPIROCIN CALCIUM 2 % EX CREA
TOPICAL_CREAM | Freq: Once | CUTANEOUS | Status: AC
  Filled 2023-04-15: qty 15

## 2023-04-15 MED ORDER — CEPHALEXIN 500 MG PO CAPS: 500.0000 mg | ORAL_CAPSULE | Freq: Two times a day (BID) | ORAL | 0 refills | Status: DC

## 2023-04-15 MED ORDER — OXYCODONE HCL 5 MG PO TABS
10.0000 mg | ORAL_TABLET | Freq: Once | ORAL | Status: AC
Start: 2023-04-15 — End: 2023-04-15
  Administered 2023-04-15: 10 mg via ORAL
  Filled 2023-04-15: qty 2

## 2023-04-15 MED ORDER — CEPHALEXIN 500 MG PO CAPS: 500.0000 mg | ORAL_CAPSULE | Freq: Four times a day (QID) | ORAL | 0 refills | Status: DC

## 2023-04-15 NOTE — ED Triage Notes (Addendum)
 Pt [facility] from [facility] r/t 1 missed dialysis appt. Pt has become raw and sore in groin and between thighs and underbelly.  This was addressed and tx begun on a previous visit to hospital but is still an issue.  It hurts a lot when moving so she didn't want to go to dialysis b/c of the discomfort.    She is supposed to be T,Th, S but rotation was affected by holidays.  Pt had dialysis on Monday and was supposed to  have on Tuesday as well but did not go Tuesday.  Pt is not in resp distress at this time.  R. Side restricted. Pt states she took midodrine  before leaving facility.   128/72 HR 64 RR 18 100%

## 2023-04-15 NOTE — Discharge Instructions (Addendum)
 Today you are seen for skin breakdown of the groin and lower abdomen.  Please pick up your antibiotic and take as prescribed.  Thank you for letting us  treat you today. After reviewing your labs and imaging, I feel you are safe to go home. Please follow up with your PCP in the next several days and provide them with your records from this visit. Return to the Emergency Room if pain becomes severe or symptoms worsen.

## 2023-04-15 NOTE — ED Provider Notes (Signed)
 Assumed care of patient at shift change from Joshua Geiple, PA-C.  Please see his previous note for full HPI and detailed physical exam.  Patient with a history of ESRD and bilateral AKA's presents today for sores in groin and lower abdomen suspected from [facility] lift.  On physical exam patient has mild erythema to groin and panniculus region without purulent discharge.  Some intermittent sores noted without discharge.  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  Labs: CBC: Leukocytosis at 13.1 and mild anemia at 11.3 BMP: Mildly decreased sodium and chloride, elevated bun at 38 which is baseline per historical values, mildly elevated anion gap at 16  Imaging: Chest x-ray: Mildly diffuse pulmonary vascular congestion, may be due to volume overload.  No frank pulmonary edema  Plan: Bandage wounds, possible emergent dialysis as she missed dialysis today.  Last time dialyzed was Monday.  Consider for mission further workup however patient's vital signs, physical exam, labs, and imaging of all been reassuring.  Despite missing dialysis today, patient's labs do not indicate need for emergent dialysis at this time.  Patient's wounds have been covered in mupirocin  ointment and bulky dressing applied.  Patient put on short course of outpatient Keflex  given leukocytosis and mildly erythematous open sores on groin/panniculus.  Patient should follow-up with PCP for further evaluation and possible treatment if symptoms persist.   Francis Ileana SAILOR, PA-C 04/15/23 1659    Yolande Lamar BROCKS, MD 04/21/23 1200

## 2023-04-15 NOTE — ED Provider Notes (Signed)
 [place] EMERGENCY DEPARTMENT AT [facility] Provider Note   CSN: [id number] Arrival date & time: 04/15/23  1327     History  No chief complaint on file.   Courtney Gray is a 53 y.o. female.  Patient with history of end-stage renal disease on dialysis, hypertension, NSTEMI, hyperlipidemia, diabetes, bilateral lower extremity amputations --patient presents to the emergency department today for evaluation of lower abdominal and groin wounds.  Patient states worsening pain in these areas, recently exacerbated during an instance when she was moved with a [employer] lift.  She does take oxycodone  for pain.  No fevers, vomiting.  EMS transported.  Blood pressure 128/72 with them.  Patient was due for dialysis today but did not go.  She does report having dialysis on Monday.  Patient did take midodrine  prior to coming to the hospital today.  She denies worsening generalized edema, worsening difficulty breathing.       Home Medications Prior to Admission medications   Medication Sig Start Date End Date Taking? Authorizing Provider  aspirin  EC 81 MG tablet Take 1 tablet (81 mg total) by mouth daily. Swallow whole. 05/16/22  Yes [place], Eric J, DO  Darbepoetin Alfa  (ARANESP ) 200 MCG/0.4ML SOSY injection Inject 200 mcg into the skin once a week. Every Tuesday   Yes [provider]  levothyroxine  (SYNTHROID ) 100 MCG tablet Take 100 mcg by mouth daily.   Yes [provider]  lidocaine  (LIDODERM ) 5 % Place 1 patch onto the skin daily. Remove & Discard patch within 12 hours or as directed by MD   Yes [provider]  midodrine  (PROAMATINE ) 10 MG tablet Take 1 tablet (10 mg total) by mouth every Monday, Wednesday, and Friday at 8 PM. Before hemodialysis 05/16/22  Yes Austria, Camellia PARAS, DO  senna (SENOKOT) 8.6 MG TABS tablet Take 2 tablets (17.2 mg total) by mouth daily. 11/13/22  Yes Francella Rogue, MD  acetaminophen  (TYLENOL ) 325 MG tablet Take 2 tablets (650 mg total) by  mouth every 6 (six) hours as needed for mild pain (pain score 1-3) (or Fever >/= 101). 04/10/23   Samtani, Jai-Gurmukh, MD  docusate sodium  (COLACE) 100 MG capsule Take 100 mg by mouth 2 (two) times daily.    [provider]  insulin  lispro (HUMALOG ) 100 UNIT/ML injection CBG >250 give 1 unit, CBG >300 give 2 units, CBG >350 give 4 units and call MD 04/10/23 04/09/24  Samtani, Jai-Gurmukh, MD  oxyCODONE  (OXY IR/ROXICODONE ) 5 MG immediate release tablet Take 1 tablet (5 mg total) by mouth every 6 (six) hours as needed for severe pain (pain score 7-10). 04/10/23   Samtani, Jai-Gurmukh, MD  pantoprazole  (PROTONIX ) 40 MG tablet Take 1 tablet (40 mg total) by mouth daily. 11/29/20   Pearlean Manus, MD  Patiromer  Sorbitex Calcium  (VELTASSA ) 16.8 g PACK Take 16.8 g by mouth every Monday, Wednesday, and Friday. 03/23/23   Christobal Guadalajara, MD  sevelamer  (RENAGEL ) 800 MG tablet Take 2 tablets (1,600 mg total) by mouth 3 (three) times daily with meals. 04/10/23   Samtani, Jai-Gurmukh, MD  sodium chloride  0.9 % SOLN 100 mL with sodium thiosulfate  250 MG/ML SOLN 25 g Inject 25 g into the vein every Monday, Wednesday, and Friday with hemodialysis. 03/25/23   Danton Reyes DASEN, MD      Allergies    Benzonatate, Hydralazine , Amoxicillin, Clonidine, Chlorhexidine , and Morphine     Review of Systems   Review of Systems  Physical Exam Updated Vital Signs BP 110/73   Pulse (!) 117  Temp (!) 97.4 F (36.3 C) (Oral)   Resp (!) 26   SpO2 100%   Physical Exam Vitals and nursing note reviewed.  Constitutional:      General: She is not in acute distress.    Appearance: She is well-developed.  HENT:     Head: Normocephalic and atraumatic.     Right Ear: External ear normal.     Left Ear: External ear normal.     Nose: Nose normal.  Eyes:     Conjunctiva/sclera: Conjunctivae normal.  Cardiovascular:     Rate and Rhythm: Normal rate and regular rhythm.     Heart sounds: No murmur heard. Pulmonary:      Effort: No respiratory distress.     Breath sounds: No wheezing, rhonchi or rales.  Abdominal:     Palpations: Abdomen is soft.     Tenderness: There is no abdominal tenderness. There is no guarding or rebound.  Musculoskeletal:     Cervical back: Normal range of motion and neck supple.     Right lower leg: No edema.     Left lower leg: No edema.     Comments: Bilateral lower extremity amputations.  Skin:    Findings: Erythema present. No rash.     Comments: Patient with inflammatory changes noted beneath the pannus and in the groin folds, right greater than left.  Superficial layer of skin has broken down.  This extends into the groin area and labial area as well.  I do not see any purulence.  Do not palpate any abscesses.  Neurological:     General: No focal deficit present.     Mental Status: She is alert. Mental status is at baseline.     Motor: No weakness.  Psychiatric:        Mood and Affect: Mood normal.     ED Results / Procedures / Treatments   Labs (all labs ordered are listed, but only abnormal results are displayed) Labs Reviewed  BASIC METABOLIC PANEL - Abnormal; Notable for the following components:      Result Value   Sodium 134 (*)    Chloride 95 (*)    Glucose, Bld 110 (*)    BUN 38 (*)    Creatinine, Ser 7.79 (*)    GFR, Estimated 6 (*)    Anion gap 16 (*)    All other components within normal limits  CBC WITH DIFFERENTIAL/PLATELET   EKG None  Radiology No results found.  Procedures Procedures    Medications Ordered in ED Medications  oxyCODONE  (Oxy IR/ROXICODONE ) immediate release tablet 10 mg (has no administration in time range)    ED Course/ Medical Decision Making/ A&P    Patient seen and examined. History obtained directly from patient.  Patient was normotensive with EMS but reportedly hypotensive on arrival.  I went in and adjusted the patient's blood pressure cuff and blood pressure found to be 110/73.  I suspect that the initial low  readings were inaccurate.  Patient does have recent documentation of systolic blood pressures 90s-100s.  Labs/EKG: Ordered CBC, BMP.  Imaging: Ordered chest x-ray.  Medications/Fluids: P.o. oxycodone  for pain control  Most recent vital signs reviewed and are as follows: BP 110/73   Pulse (!) 117   Temp (!) 97.4 F (36.3 C) (Oral)   Resp (!) 26   SpO2 100%   Initial impression: The main reason the patient is here is due to skin breakdown and pain related to this in the lower abdominal and  groin area.  The worst areas are in the groin around were she would be lifted by [employer] lift.    I do not see any signs of cellulitis.  Skin is mildly macerated but no purulent drainage.  Patient will need good wound care.  Will plan to dress wounds.  I have low concern for sepsis currently.  Patient discussed with Dr. Ellouise.   3:38 PM Plan: Treat pain, dress wounds with Bactroban  ointment and dressings.  Awaiting results of lab work.  Discussed with patient that if she has no acute needs for dialysis, goal will be to perform good wound care and protect the affected skin as best as possible while it heals.  She verbalizes understanding.  She is requesting something to eat.  She has not yet received her oral pain medication.  Signout to [facility] at shift change.                                 Medical Decision Making Amount and/or Complexity of Data Reviewed Labs: ordered. Radiology: ordered.  Risk Prescription drug management.   Patient brought to the emergency department today for skin wounds.  This appears to be a combination of skin breakdown due to location and possibly trauma from the left that she uses.  I do not see any signs of significant cellulitis at this time.  No purulent drainage noted.  Also, patient missed dialysis today and will need to be evaluated for any indications for emergent dialysis.  Good wound care will be very important.  At this time I do not feel that she  requires any antibiotics.  Low concern for sepsis.          Final Clinical Impression(s) / ED Diagnoses Final diagnoses:  Skin breakdown  ESRD (end stage renal disease) on dialysis [facility])    Rx / DC Orders ED Discharge Orders     None         Desiderio Chew, PA-C 04/15/23 1540    [place], Victoria K, [place] 04/15/23 [phone]

## 2023-04-17 ENCOUNTER — Inpatient Hospital Stay (HOSPITAL_COMMUNITY)
Admission: EM | Admit: 2023-04-17 | Discharge: 2023-05-09 | DRG: 871 | Disposition: E | Payer: 59 | Attending: Pulmonary Disease | Admitting: Pulmonary Disease

## 2023-04-17 ENCOUNTER — Other Ambulatory Visit: Payer: Self-pay

## 2023-04-17 ENCOUNTER — Emergency Department (HOSPITAL_COMMUNITY): Payer: 59

## 2023-04-17 DIAGNOSIS — I081 Rheumatic disorders of both mitral and tricuspid valves: Secondary | ICD-10-CM | POA: Diagnosis present

## 2023-04-17 DIAGNOSIS — E872 Acidosis, unspecified: Secondary | ICD-10-CM | POA: Diagnosis present

## 2023-04-17 DIAGNOSIS — I5020 Unspecified systolic (congestive) heart failure: Secondary | ICD-10-CM

## 2023-04-17 DIAGNOSIS — M793 Panniculitis, unspecified: Secondary | ICD-10-CM | POA: Diagnosis present

## 2023-04-17 DIAGNOSIS — G9341 Metabolic encephalopathy: Secondary | ICD-10-CM | POA: Diagnosis present

## 2023-04-17 DIAGNOSIS — L089 Local infection of the skin and subcutaneous tissue, unspecified: Principal | ICD-10-CM | POA: Diagnosis present

## 2023-04-17 DIAGNOSIS — N2581 Secondary hyperparathyroidism of renal origin: Secondary | ICD-10-CM | POA: Diagnosis present

## 2023-04-17 DIAGNOSIS — M109 Gout, unspecified: Secondary | ICD-10-CM | POA: Diagnosis present

## 2023-04-17 DIAGNOSIS — Z7982 Long term (current) use of aspirin: Secondary | ICD-10-CM

## 2023-04-17 DIAGNOSIS — Z6841 Body Mass Index (BMI) 40.0 and over, adult: Secondary | ICD-10-CM

## 2023-04-17 DIAGNOSIS — I5022 Chronic systolic (congestive) heart failure: Secondary | ICD-10-CM | POA: Diagnosis present

## 2023-04-17 DIAGNOSIS — R188 Other ascites: Secondary | ICD-10-CM | POA: Diagnosis present

## 2023-04-17 DIAGNOSIS — R6521 Severe sepsis with septic shock: Secondary | ICD-10-CM | POA: Diagnosis present

## 2023-04-17 DIAGNOSIS — Z89611 Acquired absence of right leg above knee: Secondary | ICD-10-CM

## 2023-04-17 DIAGNOSIS — N186 End stage renal disease: Secondary | ICD-10-CM | POA: Diagnosis present

## 2023-04-17 DIAGNOSIS — I480 Paroxysmal atrial fibrillation: Secondary | ICD-10-CM | POA: Diagnosis not present

## 2023-04-17 DIAGNOSIS — Z794 Long term (current) use of insulin: Secondary | ICD-10-CM

## 2023-04-17 DIAGNOSIS — L89150 Pressure ulcer of sacral region, unstageable: Secondary | ICD-10-CM | POA: Diagnosis present

## 2023-04-17 DIAGNOSIS — J9621 Acute and chronic respiratory failure with hypoxia: Secondary | ICD-10-CM | POA: Diagnosis present

## 2023-04-17 DIAGNOSIS — J449 Chronic obstructive pulmonary disease, unspecified: Secondary | ICD-10-CM | POA: Diagnosis present

## 2023-04-17 DIAGNOSIS — A419 Sepsis, unspecified organism: Secondary | ICD-10-CM | POA: Diagnosis present

## 2023-04-17 DIAGNOSIS — Z992 Dependence on renal dialysis: Secondary | ICD-10-CM

## 2023-04-17 DIAGNOSIS — Z792 Long term (current) use of antibiotics: Secondary | ICD-10-CM

## 2023-04-17 DIAGNOSIS — I2510 Atherosclerotic heart disease of native coronary artery without angina pectoris: Secondary | ICD-10-CM | POA: Diagnosis present

## 2023-04-17 DIAGNOSIS — I252 Old myocardial infarction: Secondary | ICD-10-CM

## 2023-04-17 DIAGNOSIS — K750 Abscess of liver: Secondary | ICD-10-CM | POA: Diagnosis present

## 2023-04-17 DIAGNOSIS — E7800 Pure hypercholesterolemia, unspecified: Secondary | ICD-10-CM | POA: Diagnosis present

## 2023-04-17 DIAGNOSIS — Z7189 Other specified counseling: Secondary | ICD-10-CM

## 2023-04-17 DIAGNOSIS — E875 Hyperkalemia: Secondary | ICD-10-CM | POA: Diagnosis present

## 2023-04-17 DIAGNOSIS — E1122 Type 2 diabetes mellitus with diabetic chronic kidney disease: Secondary | ICD-10-CM | POA: Diagnosis present

## 2023-04-17 DIAGNOSIS — D733 Abscess of spleen: Secondary | ICD-10-CM | POA: Diagnosis present

## 2023-04-17 DIAGNOSIS — Z880 Allergy status to penicillin: Secondary | ICD-10-CM

## 2023-04-17 DIAGNOSIS — L03311 Cellulitis of abdominal wall: Secondary | ICD-10-CM | POA: Diagnosis present

## 2023-04-17 DIAGNOSIS — Z66 Do not resuscitate: Secondary | ICD-10-CM | POA: Diagnosis present

## 2023-04-17 DIAGNOSIS — I132 Hypertensive heart and chronic kidney disease with heart failure and with stage 5 chronic kidney disease, or end stage renal disease: Secondary | ICD-10-CM | POA: Diagnosis present

## 2023-04-17 DIAGNOSIS — Z888 Allergy status to other drugs, medicaments and biological substances status: Secondary | ICD-10-CM

## 2023-04-17 DIAGNOSIS — L8990 Pressure ulcer of unspecified site, unspecified stage: Secondary | ICD-10-CM

## 2023-04-17 DIAGNOSIS — E039 Hypothyroidism, unspecified: Secondary | ICD-10-CM | POA: Diagnosis present

## 2023-04-17 DIAGNOSIS — G9340 Encephalopathy, unspecified: Secondary | ICD-10-CM

## 2023-04-17 DIAGNOSIS — D631 Anemia in chronic kidney disease: Secondary | ICD-10-CM | POA: Diagnosis present

## 2023-04-17 DIAGNOSIS — L8996 Pressure-induced deep tissue damage of unspecified site: Secondary | ICD-10-CM

## 2023-04-17 DIAGNOSIS — I469 Cardiac arrest, cause unspecified: Secondary | ICD-10-CM | POA: Diagnosis not present

## 2023-04-17 DIAGNOSIS — Z885 Allergy status to narcotic agent status: Secondary | ICD-10-CM

## 2023-04-17 DIAGNOSIS — Z833 Family history of diabetes mellitus: Secondary | ICD-10-CM

## 2023-04-17 DIAGNOSIS — Z79899 Other long term (current) drug therapy: Secondary | ICD-10-CM

## 2023-04-17 DIAGNOSIS — D696 Thrombocytopenia, unspecified: Secondary | ICD-10-CM | POA: Diagnosis present

## 2023-04-17 DIAGNOSIS — Z515 Encounter for palliative care: Secondary | ICD-10-CM | POA: Diagnosis not present

## 2023-04-17 DIAGNOSIS — R5381 Other malaise: Secondary | ICD-10-CM | POA: Diagnosis present

## 2023-04-17 DIAGNOSIS — Z79890 Hormone replacement therapy (postmenopausal): Secondary | ICD-10-CM

## 2023-04-17 DIAGNOSIS — J9611 Chronic respiratory failure with hypoxia: Secondary | ICD-10-CM

## 2023-04-17 DIAGNOSIS — Z89612 Acquired absence of left leg above knee: Secondary | ICD-10-CM

## 2023-04-17 DIAGNOSIS — Z9981 Dependence on supplemental oxygen: Secondary | ICD-10-CM

## 2023-04-17 LAB — COMPREHENSIVE METABOLIC PANEL
ALT: 36 U/L (ref 0–44)
AST: 41 U/L (ref 15–41)
Albumin: 3 g/dL — ABNORMAL LOW (ref 3.5–5.0)
Alkaline Phosphatase: 86 U/L (ref 38–126)
Anion gap: 20 — ABNORMAL HIGH (ref 5–15)
BUN: 62 mg/dL — ABNORMAL HIGH (ref 6–20)
CO2: 18 mmol/L — ABNORMAL LOW (ref 22–32)
Calcium: 9.4 mg/dL (ref 8.9–10.3)
Chloride: 92 mmol/L — ABNORMAL LOW (ref 98–111)
Creatinine, Ser: 9.75 mg/dL — ABNORMAL HIGH (ref 0.44–1.00)
GFR, Estimated: 4 mL/min — ABNORMAL LOW (ref >60)
Glucose, Bld: 89 mg/dL (ref 70–99)
Potassium: 6.6 mmol/L (ref 3.5–5.1)
Sodium: 130 mmol/L — ABNORMAL LOW (ref 135–145)
Total Bilirubin: 3.8 mg/dL — ABNORMAL HIGH (ref 0.0–1.2)
Total Protein: 7.3 g/dL (ref 6.5–8.1)

## 2023-04-17 LAB — CBC WITH DIFFERENTIAL/PLATELET
Abs Immature Granulocytes: 0.3 10*3/uL — ABNORMAL HIGH (ref 0.00–0.07)
Basophils Absolute: 0 10*3/uL (ref 0.0–0.1)
Basophils Relative: 0 %
Eosinophils Absolute: 0 10*3/uL (ref 0.0–0.5)
Eosinophils Relative: 0 %
HCT: 35.9 % — ABNORMAL LOW (ref 36.0–46.0)
Hemoglobin: 11.7 g/dL — ABNORMAL LOW (ref 12.0–15.0)
Immature Granulocytes: 2 %
Lymphocytes Relative: 4 %
Lymphs Abs: 0.8 10*3/uL (ref 0.7–4.0)
MCH: 32.8 pg (ref 26.0–34.0)
MCHC: 32.6 g/dL (ref 30.0–36.0)
MCV: 100.6 fL — ABNORMAL HIGH (ref 80.0–100.0)
Monocytes Absolute: 1.2 10*3/uL — ABNORMAL HIGH (ref 0.1–1.0)
Monocytes Relative: 7 %
Neutro Abs: 15.8 10*3/uL — ABNORMAL HIGH (ref 1.7–7.7)
Neutrophils Relative %: 87 %
Platelets: 69 10*3/uL — ABNORMAL LOW (ref 150–400)
RBC: 3.57 MIL/uL — ABNORMAL LOW (ref 3.87–5.11)
RDW: 23.8 % — ABNORMAL HIGH (ref 11.5–15.5)
Smear Review: DECREASED
WBC: 18.1 10*3/uL — ABNORMAL HIGH (ref 4.0–10.5)
nRBC: 0.3 % — ABNORMAL HIGH (ref 0.0–0.2)

## 2023-04-17 LAB — GLUCOSE, CAPILLARY: Glucose-Capillary: 95 mg/dL (ref 70–99)

## 2023-04-17 MED ORDER — OXYCODONE HCL 5 MG PO TABS
5.0000 mg | ORAL_TABLET | ORAL | Status: DC | PRN
  Administered 2023-04-18: 5 mg via ORAL
  Filled 2023-04-17 (×3): qty 1

## 2023-04-17 MED ORDER — SENNA 8.6 MG PO TABS: 2.0000 | ORAL_TABLET | Freq: Every day | ORAL | Status: DC

## 2023-04-17 MED ORDER — MIDODRINE HCL 5 MG PO TABS
10.0000 mg | ORAL_TABLET | Freq: Once | ORAL | Status: AC
  Administered 2023-04-17: 10 mg via ORAL
  Filled 2023-04-17 (×2): qty 2

## 2023-04-17 MED ORDER — INSULIN ASPART 100 UNIT/ML IJ SOLN: 0.0000 [IU] | Freq: Every day | INTRAMUSCULAR | Status: DC

## 2023-04-17 MED ORDER — HEPARIN SODIUM (PORCINE) 1000 UNIT/ML IJ SOLN
6000.0000 [IU] | Freq: Once | INTRAMUSCULAR | Status: AC
  Administered 2023-04-17: 6000 [IU]

## 2023-04-17 MED ORDER — HEPARIN SODIUM (PORCINE) 1000 UNIT/ML IJ SOLN
INTRAMUSCULAR | Status: AC
  Filled 2023-04-17: qty 6

## 2023-04-17 MED ORDER — SODIUM CHLORIDE 0.9% FLUSH
3.0000 mL | Freq: Two times a day (BID) | INTRAVENOUS | Status: DC
  Administered 2023-04-18 (×2): 3 mL via INTRAVENOUS

## 2023-04-17 MED ORDER — SODIUM BICARBONATE 8.4 % IV SOLN
50.0000 meq | Freq: Once | INTRAVENOUS | Status: AC
  Administered 2023-04-17: 50 meq via INTRAVENOUS
  Filled 2023-04-17: qty 50

## 2023-04-17 MED ORDER — ACETAMINOPHEN 650 MG RE SUPP: 650.0000 mg | Freq: Four times a day (QID) | RECTAL | Status: DC | PRN

## 2023-04-17 MED ORDER — MIDODRINE HCL 5 MG PO TABS: 10.0000 mg | ORAL_TABLET | Freq: Three times a day (TID) | ORAL | Status: DC

## 2023-04-17 MED ORDER — INSULIN ASPART 100 UNIT/ML IJ SOLN: 0.0000 [IU] | Freq: Three times a day (TID) | INTRAMUSCULAR | Status: DC

## 2023-04-17 MED ORDER — ALBUTEROL SULFATE (2.5 MG/3ML) 0.083% IN NEBU
10.0000 mg | INHALATION_SOLUTION | Freq: Once | RESPIRATORY_TRACT | Status: AC
  Administered 2023-04-17: 10 mg via RESPIRATORY_TRACT
  Filled 2023-04-17: qty 12

## 2023-04-17 MED ORDER — HEPARIN SODIUM (PORCINE) 5000 UNIT/ML IJ SOLN: 5000.0000 [IU] | Freq: Three times a day (TID) | INTRAMUSCULAR | Status: DC

## 2023-04-17 MED ORDER — DOCUSATE SODIUM 100 MG PO CAPS: 100.0000 mg | ORAL_CAPSULE | Freq: Two times a day (BID) | ORAL | Status: DC

## 2023-04-17 MED ORDER — PANTOPRAZOLE SODIUM 40 MG PO TBEC: 40.0000 mg | DELAYED_RELEASE_TABLET | Freq: Every day | ORAL | Status: DC

## 2023-04-17 MED ORDER — HEPARIN SODIUM (PORCINE) 1000 UNIT/ML IJ SOLN: 2500.0000 [IU] | Freq: Once | INTRAMUSCULAR | Status: DC

## 2023-04-17 MED ORDER — HYDROMORPHONE HCL 1 MG/ML IJ SOLN
0.5000 mg | INTRAMUSCULAR | Status: DC | PRN
  Administered 2023-04-17 – 2023-04-18 (×2): 0.5 mg via INTRAVENOUS
  Filled 2023-04-17 (×2): qty 0.5

## 2023-04-17 MED ORDER — HYDROMORPHONE HCL 1 MG/ML IJ SOLN
1.0000 mg | Freq: Once | INTRAMUSCULAR | Status: AC
  Administered 2023-04-17: 1 mg via INTRAVENOUS
  Filled 2023-04-17: qty 1

## 2023-04-17 MED ORDER — CALCIUM GLUCONATE-NACL 1-0.675 GM/50ML-% IV SOLN
1.0000 g | Freq: Once | INTRAVENOUS | Status: AC
  Administered 2023-04-17: 1000 mg via INTRAVENOUS
  Filled 2023-04-17: qty 50

## 2023-04-17 MED ORDER — OXYCODONE-ACETAMINOPHEN 5-325 MG PO TABS
2.0000 | ORAL_TABLET | Freq: Once | ORAL | Status: AC
  Administered 2023-04-17: 2 via ORAL
  Filled 2023-04-17: qty 2

## 2023-04-17 MED ORDER — SEVELAMER CARBONATE 800 MG PO TABS
1600.0000 mg | ORAL_TABLET | Freq: Three times a day (TID) | ORAL | Status: DC
Start: 2023-04-17 — End: 2023-04-19
  Administered 2023-04-18: 1600 mg via ORAL
  Filled 2023-04-17: qty 2

## 2023-04-17 MED ORDER — LEVOTHYROXINE SODIUM 100 MCG PO TABS
100.0000 ug | ORAL_TABLET | Freq: Every day | ORAL | Status: DC
Start: 2023-04-18 — End: 2023-04-19
  Administered 2023-04-18: 100 ug via ORAL
  Filled 2023-04-17: qty 1

## 2023-04-17 MED ORDER — IPRATROPIUM-ALBUTEROL 0.5-2.5 (3) MG/3ML IN SOLN: 3.0000 mL | Freq: Four times a day (QID) | RESPIRATORY_TRACT | Status: DC | PRN

## 2023-04-17 MED ORDER — VANCOMYCIN HCL IN DEXTROSE 1-5 GM/200ML-% IV SOLN
1000.0000 mg | Freq: Once | INTRAVENOUS | Status: AC
  Administered 2023-04-17: 1000 mg via INTRAVENOUS
  Filled 2023-04-17: qty 200

## 2023-04-17 MED ORDER — ASPIRIN 81 MG PO TBEC: 81.0000 mg | DELAYED_RELEASE_TABLET | Freq: Every day | ORAL | Status: DC

## 2023-04-17 MED ORDER — HEPARIN SODIUM (PORCINE) 1000 UNIT/ML IJ SOLN
INTRAMUSCULAR | Status: AC
  Filled 2023-04-17: qty 5

## 2023-04-17 MED ORDER — SODIUM CHLORIDE 0.9 % IV SOLN
1.0000 g | Freq: Every evening | INTRAVENOUS | Status: DC
  Administered 2023-04-17 – 2023-04-18 (×2): 1 g via INTRAVENOUS
  Filled 2023-04-17 (×2): qty 10

## 2023-04-17 MED ORDER — ACETAMINOPHEN 325 MG PO TABS: 650.0000 mg | ORAL_TABLET | Freq: Four times a day (QID) | ORAL | Status: DC | PRN

## 2023-04-17 MED ORDER — VANCOMYCIN HCL IN DEXTROSE 1-5 GM/200ML-% IV SOLN
1000.0000 mg | Freq: Once | INTRAVENOUS | Status: DC
Start: 2023-04-17 — End: 2023-04-18

## 2023-04-17 NOTE — Progress Notes (Signed)
 Pharmacy Antibiotic Note  Courtney Gray is a 53 y.o. female for which pharmacy has been consulted for cefepime  and vancomycin  dosing for  wound infection .  Patient with a history of bacteremia, HF, DM, ESRD on HD, HTN. Patient presenting with infection on her bottom buttock and groin area.  WBC 18.1; T 97.9; HR 83; RR 29  Plan: Vancomycin  2g once -- repeat pending HD plan Cefepime  1g q24hr  Monitor WBC, fever, renal function, cultures De-escalate when able F/u Nephrology plan     Temp (24hrs), Avg:98.1 F (36.7 C), Min:97.9 F (36.6 C), Max:98.2 F (36.8 C)  Recent Labs  Lab 04/15/23 1448 04/17/23 1327  WBC 13.1* 18.1*  CREATININE 7.79* 9.75*    Estimated Creatinine Clearance: 5.7 mL/min (A) (by C-G formula based on SCr of 9.75 mg/dL (H)).    Allergies  Allergen Reactions   Benzonatate Other (See Comments)    Hallucinations   Hydralazine  Other (See Comments)    Hallucinations   Amoxicillin Itching and Nausea And Vomiting   Clonidine Other (See Comments)    Altered mental state, insomnia    Chlorhexidine  Itching    Patient reports it is the CHG baths that cause her itching not the CHG used for caring for her IV/HD access.     Morphine  Itching   Microbiology results: Pending  Thank you for allowing pharmacy to be a part of this patient's care.  Dorn Buttner, PharmD, BCPS 04/17/2023 3:05 PM ED Clinical Pharmacist -  [phone]

## 2023-04-17 NOTE — ED Provider Notes (Signed)
 [place] EMERGENCY DEPARTMENT AT [facility] Provider Note   CSN: [id number] Arrival date & time: 04/17/23  9371     History  Chief Complaint  Patient presents with   Wound Infection    Pt to ED c/o infected wounds in multiple areas (lower abdomen, pelvic area, and buttocks). She was recently prescribed 2 antibiotics for wounds, but c/o of both medications making her sick. Pt is on 3L oxygen  normally    Courtney Gray is a 53 y.o. female.  Patient complains of infection on her bottom buttock and groin area.  Patient reports infection began after developing wounds from the [employer] lift at the facility where she resides.  Patient states she was seen here recently and advised to return if symptoms worsen.  Patient states she has more drainage and the wounds are worse.  Patient reports she feels like she has had a fever.  Patient denies any chills.  The history is provided by the patient. No language interpreter was used.       Home Medications Prior to Admission medications   Medication Sig Start Date End Date Taking? Authorizing Provider  acetaminophen  (TYLENOL ) 325 MG tablet Take 2 tablets (650 mg total) by mouth every 6 (six) hours as needed for mild pain (pain score 1-3) (or Fever >/= 101). Patient taking differently: Take 650 mg by mouth every 6 (six) hours as needed for mild pain (pain score 1-3) or moderate pain (pain score 4-6) (or Fever >/= 101). 04/10/23  Yes Samtani, Jai-Gurmukh, MD  aspirin  EC 81 MG tablet Take 1 tablet (81 mg total) by mouth daily. Swallow whole. 05/16/22  Yes [place], Eric J, DO  cephALEXin  (KEFLEX ) 500 MG capsule Take 1 capsule (500 mg total) by mouth 2 (two) times daily for 7 days. 04/15/23 04/22/23 Yes Francis Ileana SAILOR, PA-C  docusate sodium  (COLACE) 100 MG capsule Take 100 mg by mouth 2 (two) times daily.   Yes [provider]  glucagon  (GLUCAGEN ) 1 MG SOLR injection Inject 1 mg into the vein once as needed for low blood sugar. Glucose less  than 70   Yes [provider]  insulin  lispro (HUMALOG  KWIKPEN) 100 UNIT/ML KwikPen Inject 1-4 Units into the skin 3 (three) times daily before meals. Per sliding scale: If 251-300=1 unit 301-350=2 unit 351+=4 units Call MD/ NP if >350   Yes [provider]  levothyroxine  (SYNTHROID ) 100 MCG tablet Take 100 mcg by mouth daily.   Yes [provider]  lidocaine  (LIDODERM ) 5 % Place 1 patch onto the skin daily. Remove & Discard patch within 12 hours or as directed by MD   Yes [provider]  midodrine  (PROAMATINE ) 10 MG tablet Take 1 tablet (10 mg total) by mouth every Monday, Wednesday, and Friday at 8 PM. Before hemodialysis Patient taking differently: Take 10 mg by mouth every Monday, Wednesday, and Friday at 8 PM. Before hemodialysis 05/16/22  Yes [place], Eric J, DO  oxyCODONE  (OXY IR/ROXICODONE ) 5 MG immediate release tablet Take 1 tablet (5 mg total) by mouth every 6 (six) hours as needed for severe pain (pain score 7-10). 04/10/23  Yes Samtani, Jai-Gurmukh, MD  OXYGEN  Inhale 4 L into the lungs continuous.   Yes [provider]  pantoprazole  (PROTONIX ) 40 MG tablet Take 1 tablet (40 mg total) by mouth daily. Patient taking differently: Take 40 mg by mouth daily. 11/29/20  Yes Emokpae, Courage, MD  senna (SENOKOT) 8.6 MG TABS tablet Take 2 tablets (17.2 mg total) by mouth  daily. 11/13/22  Yes Francella Rogue, MD  sevelamer  (RENAGEL ) 800 MG tablet Take 2 tablets (1,600 mg total) by mouth 3 (three) times daily with meals. 04/10/23  Yes Samtani, Jai-Gurmukh, MD      Allergies    Benzonatate, Hydralazine , Amoxicillin, Clonidine, Chlorhexidine , and Morphine     Review of Systems   Review of Systems  All other systems reviewed and are negative.   Physical Exam Updated Vital Signs BP 92/68   Pulse 92   Temp 98.2 F (36.8 C) (Oral)   Resp 19   SpO2 98%  Physical Exam Vitals and nursing note reviewed.  Constitutional:      Appearance: She is  well-developed.  HENT:     Head: Normocephalic.     Right Ear: Tympanic membrane normal.     Left Ear: Tympanic membrane normal.     Nose: Nose normal.     Mouth/Throat:     Mouth: Mucous membranes are moist.  Cardiovascular:     Rate and Rhythm: Normal rate.  Pulmonary:     Effort: Pulmonary effort is normal.  Abdominal:     General: Abdomen is flat. There is no distension.  Musculoskeletal:        General: Normal range of motion.     Cervical back: Normal range of motion.  Skin:    General: Skin is warm.  Neurological:     General: No focal deficit present.     Mental Status: She is alert and oriented to person, place, and time.  Psychiatric:        Mood and Affect: Mood normal.     ED Results / Procedures / Treatments   Labs (all labs ordered are listed, but only abnormal results are displayed) Labs Reviewed  CBC WITH DIFFERENTIAL/PLATELET  COMPREHENSIVE METABOLIC PANEL    EKG None  Radiology DG Chest Port 1 View Result Date: 04/17/2023 CLINICAL DATA:  Cough.  Wound infection. EXAM: PORTABLE CHEST 1 VIEW COMPARISON:  Chest radiograph dated 04/15/2023. FINDINGS: Evaluation is limited due to body habitus. Dialysis catheter in similar position. There is cardiomegaly with mild central vascular congestion. Probable trace bilateral pleural effusions and bibasilar atelectasis. No pneumothorax. Stable cardiomegaly. Atherosclerotic calcification of the aorta. No acute osseous pathology. IMPRESSION: Cardiomegaly with mild central vascular congestion. Electronically Signed   By: Vanetta Chou M.D.   On: 04/17/2023 09:42   DG Chest 2 View Result Date: 04/15/2023 CLINICAL DATA:  missed dialysis today, eval fluid overload EXAM: CHEST - 2 VIEW COMPARISON:  04/07/2023. FINDINGS: Low lung volume. Mild diffuse pulmonary vascular congestion, which may be due to volume overload. However, no frank pulmonary edema. Left retrocardiac opacity may represent atelectasis and/or consolidation.  Bilateral lung fields are otherwise clear. No large pleural effusion or pneumothorax. Stable cardio-mediastinal silhouette. No acute osseous abnormalities. The soft tissues are within normal limits. Left IJ hemodialysis catheter noted with its tip overlying the cavoatrial junction region. IMPRESSION: *Mild diffuse pulmonary vascular congestion, which may be due to volume overload. No frank pulmonary edema. Electronically Signed   By: Ree Molt M.D.   On: 04/15/2023 16:01    Procedures .Critical Care  Performed by: Flint Sonny POUR, PA-C Authorized by: Flint Sonny POUR, PA-C   Critical care provider statement:    Critical care time (minutes):  30   Critical care start time:  04/17/2023 8:00 AM   Critical care end time:  04/17/2023 3:16 PM   Critical care time was exclusive of:  Separately billable procedures and treating other patients  Critical care was necessary to treat or prevent imminent or life-threatening deterioration of the following conditions:  Cardiac failure, renal failure and dehydration   Critical care was time spent personally by me on the following activities:  Blood draw for specimens, development of treatment plan with patient or surrogate, discussions with consultants and ordering and review of laboratory studies   I assumed direction of critical care for this patient from another provider in my specialty: no     Care discussed with: admitting provider       Medications Ordered in ED Medications  oxyCODONE -acetaminophen  (PERCOCET/ROXICET) 5-325 MG per tablet 2 tablet (2 tablets Oral Given 04/17/23 0722)    ED Course/ Medical Decision Making/ A&P                                 Medical Decision Making Pt complains of infection to her bottom, her legs and lower abdomen.    Amount and/or Complexity of Data Reviewed Independent Historian: EMS External Data Reviewed: notes.    Details: Hospitalist notes reviewed Labs: ordered. Decision-making details documented in  ED Course.    Details: WBC  18.  Radiology: ordered. Discussion of management or test interpretation with external provider(s): I discussed pt with hospitalist who will admit Nephrology consulted and advised treat hyperkalemia   Risk Prescription drug management. Decision regarding hospitalization. Risk Details: Pt had multiple failed IV attempts and blood draws.  Dr. Neysa was able to obtain an IV ultrasound           Final Clinical Impression(s) / ED Diagnoses Final diagnoses:  Wound infection  Pressure injury of deep tissue, unspecified location    Rx / DC Orders ED Discharge Orders     None         Flint Sonny POUR, PA-C 04/17/23 1517    Neysa Caron PARAS, DO 04/17/23 1549

## 2023-04-17 NOTE — ED Notes (Signed)
 Pt BP is very labile.  She has many erroneous hypotensive readings when she if mentating fine and if rechecked are often much closer to a normal reading.  As a result I have not been validating any of the severely hypotensive readings.

## 2023-04-17 NOTE — H&P (Signed)
 History and Physical    Patient: Courtney Gray FMW:[id number] DOB: 03-Nov-1970 DOA: 04/17/2023 DOS: the patient was seen and examined on 04/17/2023 PCP: Default, Provider, MD  Patient coming from: [facility] Medical readiness/disposition: Anticipate will be medically ready potentially by 04/21/2023.  May require surgical debridement of sacral decubitus.  Will discharge back to the nursing facility.  Chief Complaint:  Chief Complaint  Patient presents with   Wound Infection    Pt to ED c/o infected wounds in multiple areas (lower abdomen, pelvic area, and buttocks). She was recently prescribed 2 antibiotics for wounds, but c/o of both medications making her sick. Pt is on 3L oxygen  normally   HPI: Courtney Gray is a 53 y.o. 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  Patient was recently discharged on 1/3 after presenting with metabolic encephalopathy with unclear etiology although her symptoms improved after Flexeril  and Neurontin  were discontinued.  She was known to have multiple wounds at that time.  Patient reports at time of discharge she felt like her wounds were draining.  She reports that since discharge the drainage has increased and the pain has significantly increased.  Because of pain she was unable to attend dialysis sessions.  She presents today with potassium greater than 6.6 but she was stable from a respiratory standpoint and did not appear to be volume overloaded.  In the ED she was hemodynamically stable.  She was having a significant amount of pain that had not improved with a dose of Percocet.  She is afebrile but has progressive leukocytosis noting her white count on 1/8 was 13,100 and white count today was  18,100.  On examination wound was very necrotic with a high degree of fibrin and purulent drainage and was very malodorous.  The wound was examined by my attending physician who examined the patient prior to my encounter.  By the time I came to evaluate the patient she was having a significant amount of pain and I deferred examination of the wound.  Hospitalist service has been asked to admit the patient.  Neurology has been consulted noting that the patient does need to undergo dialysis given her hyperkalemia.  Because of hyperkalemia we have placed her on telemetry monitoring.  Review of Systems: As mentioned in the history of present illness. All other systems reviewed and are negative. Past Medical History:  Diagnosis Date   Arthritis    Bacteremia    Calciphylaxis    Chronic HFrEF (heart failure with reduced ejection fraction) (HCC)    Diabetes mellitus without complication (HCC)    Dialysis complication, subsequent encounter    ESRD (end stage renal disease) (HCC)    Gout    High cholesterol    Hypertension    Hypertensive emergency    Ischemic foot pain at rest 08/03/2021   Renal disorder    Right atrial mass    Severe mitral regurgitation    Past Surgical History:  Procedure Laterality Date   ABDOMINAL AORTOGRAM W/LOWER EXTREMITY N/A 08/07/2021   Procedure: ABDOMINAL AORTOGRAM W/LOWER EXTREMITY;  Surgeon: Lanis Fonda BRAVO, MD;  Location: [facility] INVASIVE CV LAB;  Service: Cardiovascular;  Laterality: N/A;   AMPUTATION Right 08/14/2021   Procedure: AMPUTATION ABOVE KNEE;  Surgeon: Gretta Lonni PARAS, MD;  Location: [facility] OR;  Service: Vascular;  Laterality: Right;  AMPUTATION Left 09/23/2021   Procedure: AMPUTATION MIDDLE FINGER;  Surgeon: Romona Harari, MD;  Location: [facility] OR;  Service: Orthopedics;  Laterality: Left;   AMPUTATION Left 10/09/2021   Procedure: LEFT ABOVE KNEE AMPUTATION;  Surgeon: Magda Debby SAILOR, MD;  Location: [facility] OR;  Service: Vascular;  Laterality: Left;   AV FISTULA  PLACEMENT     x2, unsuccessful.    BUBBLE STUDY  09/27/2021   Procedure: BUBBLE STUDY;  Surgeon: Pietro Redell RAMAN, MD;  Location: [facility] ENDOSCOPY;  Service: Cardiovascular;;   IR FLUORO GUIDE CV LINE LEFT  12/14/2020   IR FLUORO GUIDE CV LINE LEFT  02/20/2021   IR FLUORO GUIDE CV LINE LEFT  09/11/2021   IR FLUORO GUIDE CV LINE LEFT  10/18/2021   IR FLUORO GUIDE CV LINE LEFT  11/26/2021   IR FLUORO GUIDE CV LINE LEFT  11/28/2021   IR FLUORO GUIDE CV LINE LEFT  02/04/2022   IR FLUORO GUIDE CV LINE LEFT  03/07/2022   IR FLUORO GUIDE CV LINE RIGHT  08/28/2020   IR FLUORO GUIDE CV LINE RIGHT  05/13/2021   IR FLUORO GUIDE CV LINE RIGHT  05/24/2021   IR INTRAVASCULAR ULTRASOUND NON CORONARY  03/07/2022   IR PERC TUN PERIT CATH WO PORT S&I /IMAG  09/06/2020   IR PTA VENOUS EXCEPT DIALYSIS CIRCUIT  02/20/2021   IR PTA VENOUS EXCEPT DIALYSIS CIRCUIT  05/13/2021   IR PTA VENOUS EXCEPT DIALYSIS CIRCUIT  11/28/2021   IR PTA VENOUS EXCEPT DIALYSIS CIRCUIT  03/07/2022   IR REMOVAL TUN CV CATH W/O FL  05/24/2021   IR REMOVAL TUN CV CATH W/O FL  10/15/2021   IR US  GUIDE VASC ACCESS LEFT  09/06/2020   IR US  GUIDE VASC ACCESS LEFT  09/11/2021   IR US  GUIDE VASC ACCESS LEFT  10/18/2021   IR US  GUIDE VASC ACCESS RIGHT  05/24/2021   IR US  GUIDE VASC ACCESS RIGHT  09/10/2021   IR VENO/JUGULAR RIGHT  09/10/2021   IR VENOCAVAGRAM SVC  02/20/2021   IR VENOCAVAGRAM SVC  11/26/2021   IR VENOCAVAGRAM SVC  03/07/2022   TEE WITHOUT CARDIOVERSION N/A 09/27/2021   Procedure: TRANSESOPHAGEAL ECHOCARDIOGRAM (TEE);  Surgeon: Pietro Redell RAMAN, MD;  Location: [facility] ENDOSCOPY;  Service: Cardiovascular;  Laterality: N/A;   TEE WITHOUT CARDIOVERSION N/A 11/11/2022   Procedure: TRANSESOPHAGEAL ECHOCARDIOGRAM;  Surgeon: Shlomo Wilbert SAUNDERS, MD;  Location: [facility] INVASIVE CV LAB;  Service: Cardiovascular;  Laterality: N/A;   UPPER EXTREMITY VENOGRAPHY Right 12/04/2021   Procedure: UPPER EXTREMITY VENOGRAPHY;  Surgeon: Lanis Fonda BRAVO, MD;  Location: [facility] INVASIVE CV LAB;   Service: Cardiovascular;  Laterality: Right;   Social History:  reports that she has never smoked. She has never used smokeless tobacco. She reports that she does not drink alcohol  and does not use drugs.  Allergies  Allergen Reactions   Benzonatate Other (See Comments)    Hallucinations   Hydralazine  Other (See Comments)    Hallucinations   Amoxicillin Itching and Nausea And Vomiting   Clonidine Other (See Comments)    Altered mental state, insomnia    Chlorhexidine  Itching    Patient reports it is the CHG baths that cause her itching not the CHG used for caring for her IV/HD access.     Morphine  Itching    Family History  Problem Relation Age of Onset   Diabetes Mother    Diabetes Father     Prior to Admission medications   Medication Sig Start Date End Date Taking?  Authorizing Provider  acetaminophen  (TYLENOL ) 325 MG tablet Take 2 tablets (650 mg total) by mouth every 6 (six) hours as needed for mild pain (pain score 1-3) (or Fever >/= 101). Patient taking differently: Take 650 mg by mouth every 6 (six) hours as needed for mild pain (pain score 1-3) or moderate pain (pain score 4-6) (or Fever >/= 101). 04/10/23  Yes Samtani, Jai-Gurmukh, MD  aspirin  EC 81 MG tablet Take 1 tablet (81 mg total) by mouth daily. Swallow whole. 05/16/22  Yes [place], Eric J, DO  cephALEXin  (KEFLEX ) 500 MG capsule Take 1 capsule (500 mg total) by mouth 2 (two) times daily for 7 days. 04/15/23 04/22/23 Yes Francis Ileana SAILOR, PA-C  docusate sodium  (COLACE) 100 MG capsule Take 100 mg by mouth 2 (two) times daily.   Yes [provider]  glucagon  (GLUCAGEN ) 1 MG SOLR injection Inject 1 mg into the vein once as needed for low blood sugar. Glucose less than 70   Yes [provider]  insulin  lispro (HUMALOG  KWIKPEN) 100 UNIT/ML KwikPen Inject 1-4 Units into the skin 3 (three) times daily before meals. Per sliding scale: If 251-300=1 unit 301-350=2 unit 351+=4 units Call MD/ NP if >350   Yes [provider]  levothyroxine  (SYNTHROID ) 100 MCG tablet Take 100 mcg by mouth daily.   Yes [provider]  lidocaine  (LIDODERM ) 5 % Place 1 patch onto the skin daily. Remove & Discard patch within 12 hours or as directed by MD   Yes [provider]  midodrine  (PROAMATINE ) 10 MG tablet Take 1 tablet (10 mg total) by mouth every Monday, Wednesday, and Friday at 8 PM. Before hemodialysis Patient taking differently: Take 10 mg by mouth every Monday, Wednesday, and Friday at 8 PM. Before hemodialysis 05/16/22  Yes [place], Eric J, DO  oxyCODONE  (OXY IR/ROXICODONE ) 5 MG immediate release tablet Take 1 tablet (5 mg total) by mouth every 6 (six) hours as needed for severe pain (pain score 7-10). 04/10/23  Yes Samtani, Jai-Gurmukh, MD  OXYGEN  Inhale 4 L into the lungs continuous.   Yes [provider]  pantoprazole  (PROTONIX ) 40 MG tablet Take 1 tablet (40 mg total) by mouth daily. Patient taking differently: Take 40 mg by mouth daily. 11/29/20  Yes Emokpae, Courage, MD  senna (SENOKOT) 8.6 MG TABS tablet Take 2 tablets (17.2 mg total) by mouth daily. 11/13/22  Yes Francella Rogue, MD  sevelamer  (RENAGEL ) 800 MG tablet Take 2 tablets (1,600 mg total) by mouth 3 (three) times daily with meals. 04/10/23  Yes Royal Sill, MD    Physical Exam: Vitals:   04/17/23 1425 04/17/23 1445 04/17/23 1554 04/17/23 1651  BP: (!) 118/58 90/66  (!) 91/35  Pulse: 83   87  Resp: 18 (!) 29  20  Temp: 97.9 F (36.6 C)   97.7 F (36.5 C)  TempSrc:      SpO2: 95%  95% (!) 76%   Constitutional: NAD, she has an uncomfortable due to significant pain of sacral decubitus Respiratory: Posterior lung exam with expiratory crackles mid fields down.. Normal respiratory effort. No accessory muscle use.  4 L oxygen  Cardiovascular: Regular rate and rhythm, no murmurs / rubs / gallops. No extremity edema.   Abdomen: no tenderness, no masses palpated. No obvious hepatosplenomegaly. Bowel sounds positive.   Musculoskeletal: no clubbing / cyanosis. No joint deformity upper and lower extremities. Good ROM, no contractures. Normal muscle tone.  Skin: Patient has a large sacral decubitus that is unstageable due to a  degree of significant necrotic fibrin and noted with malodorous purulent drainage.  She also has a calciphylaxis wound on her pannus that is dry.  Please see accompanying pictures Neurologic: CN 2-12 grossly intact. Sensation intact, strength in upper extremities 4/5.  Unable to adequately test lower extremity strength with underlying AKA. Psychiatric: Normal judgment and insight. Alert and oriented x 3. Normal mood.     Data Reviewed:  Sodium 130, potassium 6.6, chloride 92, CO2 18, BUN 62, creatinine 9.75, LFTs are normal although albumin  is low at 3.0 and total bilirubin elevated at 3.8  WBCs 18,100 with left shift, hemoglobin 11.7, MCV 100.6, platelets 69,000  Assessment and Plan: Infected sacral decubitus General Surgery has been consulted recommended CT of pelvis to determine if underlying abscess Wet to dry wound care until patient evaluated by general surgery If not a candidate for operative debridement may be a candidate for hydrotherapy Pharmacy to dose IV vancomycin  and cefepime  Patient having significant pain; will utilize Oxy IR for moderate pain and IV Dilaudid  for severe pain   Thrombocytopenia Platelets were normal in December 2024 and have progressively declined beginning on 04/07/2023 with current platelets 69,000 Will hold pharmacological DVT prophylaxis Etiology unclear  Chronic kidney disease on dialysis/hyperkalemia Appreciate assistance of nephrology team Telemetry until potassium normalized Currently no evidence of volume overload  COPD on chronic oxygen  Stable on home O2 4 L/min DuoNebs as needed  HFrEF/severe mitral regurgitation Volume management with hemodialysis  Prediabetes Last hemoglobin A1c 5.1 During acute illness will follow CBGs and  provide SSI if indicated  HLD Not on statin  Hypothyroidism Continue Synthroid   PVD Status post bilateral AKA      Advance Care Planning:   Code Status: Full Code   VTE prophylaxis: Patient with thrombocytopenia less than 100,000 so cannot use pharmacological DVT prophylaxis.  Bilateral AKA so unable to utilize SCDs  Consults: Nephrology  Family Communication: Patient only  Severity of Illness: The appropriate patient status for this patient is INPATIENT. Inpatient status is judged to be reasonable and necessary in order to provide the required intensity of service to ensure the patient's safety. The patient's presenting symptoms, physical exam findings, and initial radiographic and laboratory data in the context of their chronic comorbidities is felt to place them at high risk for further clinical deterioration. Furthermore, it is not anticipated that the patient will be medically stable for discharge from the hospital within 2 midnights of admission.   * I certify that at the point of admission it is my clinical judgment that the patient will require inpatient hospital care spanning beyond 2 midnights from the point of admission due to high intensity of service, high risk for further deterioration and high frequency of surveillance required.*  Author: Isaiah Lever, NP 04/17/2023 5:03 PM  For on call review [email or web address].

## 2023-04-17 NOTE — Consult Note (Signed)
 Renal Service Consult Note [facility]  Philomene Haff 04/17/2023 Lamar JONETTA Fret, MD Requesting Physician: A. Alto, FNP  Reason for Consult: ESRD pt presenting w/ malodorous decub wound  HPI: The patient is a 53 y.o. year-old w/ PMH as below who presented c/o worsening infection of her L buttock area. No chills, + new odor, feels like she has a fever. In ED BP 90s- 120s, HR 88=94, RR 20, afeb. Lab shows K+ 6.6, creat 9.7, Ca 9.4, wbc 18k , Hb 11.7  plt 69.  Pt was started on IV abx w/ maxipime  and IV vanc, also rec'd IV pain meds. We are asked to see for dialysis.   Pt seen in ED room. Strong odor throughout the room. No sob or CP or TDC issues. States usually pull 1- 2 L as dialysis.  Still getting sodium thiosulfate .   ROS - denies CP, no joint pain, no HA, no blurry vision, no rash   Past Medical History  Past Medical History:  Diagnosis Date   Arthritis    Bacteremia    Calciphylaxis    Chronic HFrEF (heart failure with reduced ejection fraction) (HCC)    Diabetes mellitus without complication (HCC)    Dialysis complication, subsequent encounter    ESRD (end stage renal disease) (HCC)    Gout    High cholesterol    Hypertension    Hypertensive emergency    Ischemic foot pain at rest 08/03/2021   Renal disorder    Right atrial mass    Severe mitral regurgitation    Past Surgical History  Past Surgical History:  Procedure Laterality Date   ABDOMINAL AORTOGRAM W/LOWER EXTREMITY N/A 08/07/2021   Procedure: ABDOMINAL AORTOGRAM W/LOWER EXTREMITY;  Surgeon: Lanis Fonda BRAVO, MD;  Location: [facility] INVASIVE CV LAB;  Service: Cardiovascular;  Laterality: N/A;   AMPUTATION Right 08/14/2021   Procedure: AMPUTATION ABOVE KNEE;  Surgeon: Gretta Lonni PARAS, MD;  Location: [facility] OR;  Service: Vascular;  Laterality: Right;   AMPUTATION Left 09/23/2021   Procedure: AMPUTATION MIDDLE FINGER;  Surgeon: Romona Harari, MD;  Location: [facility] OR;  Service: Orthopedics;  Laterality:  Left;   AMPUTATION Left 10/09/2021   Procedure: LEFT ABOVE KNEE AMPUTATION;  Surgeon: Magda Debby SAILOR, MD;  Location: [facility] OR;  Service: Vascular;  Laterality: Left;   AV FISTULA PLACEMENT     x2, unsuccessful.    BUBBLE STUDY  09/27/2021   Procedure: BUBBLE STUDY;  Surgeon: Pietro Redell RAMAN, MD;  Location: [facility] ENDOSCOPY;  Service: Cardiovascular;;   IR FLUORO GUIDE CV LINE LEFT  12/14/2020   IR FLUORO GUIDE CV LINE LEFT  02/20/2021   IR FLUORO GUIDE CV LINE LEFT  09/11/2021   IR FLUORO GUIDE CV LINE LEFT  10/18/2021   IR FLUORO GUIDE CV LINE LEFT  11/26/2021   IR FLUORO GUIDE CV LINE LEFT  11/28/2021   IR FLUORO GUIDE CV LINE LEFT  02/04/2022   IR FLUORO GUIDE CV LINE LEFT  03/07/2022   IR FLUORO GUIDE CV LINE RIGHT  08/28/2020   IR FLUORO GUIDE CV LINE RIGHT  05/13/2021   IR FLUORO GUIDE CV LINE RIGHT  05/24/2021   IR INTRAVASCULAR ULTRASOUND NON CORONARY  03/07/2022   IR PERC TUN PERIT CATH WO PORT S&I /IMAG  09/06/2020   IR PTA VENOUS EXCEPT DIALYSIS CIRCUIT  02/20/2021   IR PTA VENOUS EXCEPT DIALYSIS CIRCUIT  05/13/2021   IR PTA VENOUS EXCEPT DIALYSIS CIRCUIT  11/28/2021   IR PTA VENOUS EXCEPT DIALYSIS CIRCUIT  03/07/2022   IR REMOVAL TUN CV CATH W/O FL  05/24/2021   IR REMOVAL TUN CV CATH W/O FL  10/15/2021   IR US  GUIDE VASC ACCESS LEFT  09/06/2020   IR US  GUIDE VASC ACCESS LEFT  09/11/2021   IR US  GUIDE VASC ACCESS LEFT  10/18/2021   IR US  GUIDE VASC ACCESS RIGHT  05/24/2021   IR US  GUIDE VASC ACCESS RIGHT  09/10/2021   IR VENO/JUGULAR RIGHT  09/10/2021   IR VENOCAVAGRAM SVC  02/20/2021   IR VENOCAVAGRAM SVC  11/26/2021   IR VENOCAVAGRAM SVC  03/07/2022   TEE WITHOUT CARDIOVERSION N/A 09/27/2021   Procedure: TRANSESOPHAGEAL ECHOCARDIOGRAM (TEE);  Surgeon: Pietro Redell RAMAN, MD;  Location: [facility] ENDOSCOPY;  Service: Cardiovascular;  Laterality: N/A;   TEE WITHOUT CARDIOVERSION N/A 11/11/2022   Procedure: TRANSESOPHAGEAL ECHOCARDIOGRAM;  Surgeon: Shlomo Wilbert SAUNDERS, MD;  Location: [facility] INVASIVE CV LAB;  Service:  Cardiovascular;  Laterality: N/A;   UPPER EXTREMITY VENOGRAPHY Right 12/04/2021   Procedure: UPPER EXTREMITY VENOGRAPHY;  Surgeon: Lanis Fonda BRAVO, MD;  Location: [facility] INVASIVE CV LAB;  Service: Cardiovascular;  Laterality: Right;   Family History  Family History  Problem Relation Age of Onset   Diabetes Mother    Diabetes Father    Social History  reports that she has never smoked. She has never used smokeless tobacco. She reports that she does not drink alcohol  and does not use drugs. Allergies  Allergies  Allergen Reactions   Benzonatate Other (See Comments)    Hallucinations   Hydralazine  Other (See Comments)    Hallucinations   Amoxicillin Itching and Nausea And Vomiting   Clonidine Other (See Comments)    Altered mental state, insomnia    Chlorhexidine  Itching    Patient reports it is the CHG baths that cause her itching not the CHG used for caring for her IV/HD access.     Morphine  Itching   Home medications Prior to Admission medications   Medication Sig Start Date End Date Taking? Authorizing Provider  acetaminophen  (TYLENOL ) 325 MG tablet Take 2 tablets (650 mg total) by mouth every 6 (six) hours as needed for mild pain (pain score 1-3) (or Fever >/= 101). Patient taking differently: Take 650 mg by mouth every 6 (six) hours as needed for mild pain (pain score 1-3) or moderate pain (pain score 4-6) (or Fever >/= 101). 04/10/23  Yes Samtani, Jai-Gurmukh, MD  aspirin  EC 81 MG tablet Take 1 tablet (81 mg total) by mouth daily. Swallow whole. 05/16/22  Yes [place], Eric J, DO  cephALEXin  (KEFLEX ) 500 MG capsule Take 1 capsule (500 mg total) by mouth 2 (two) times daily for 7 days. 04/15/23 04/22/23 Yes Francis Ileana SAILOR, PA-C  docusate sodium  (COLACE) 100 MG capsule Take 100 mg by mouth 2 (two) times daily.   Yes [provider]  glucagon  (GLUCAGEN ) 1 MG SOLR injection Inject 1 mg into the vein once as needed for low blood sugar. Glucose less than 70   Yes [provider]   insulin  lispro (HUMALOG  KWIKPEN) 100 UNIT/ML KwikPen Inject 1-4 Units into the skin 3 (three) times daily before meals. Per sliding scale: If 251-300=1 unit 301-350=2 unit 351+=4 units Call MD/ NP if >350   Yes [provider]  levothyroxine  (SYNTHROID ) 100 MCG tablet Take 100 mcg by mouth daily.   Yes [provider]  lidocaine  (LIDODERM ) 5 % Place 1 patch onto the skin daily. Remove & Discard patch within 12 hours or as directed by MD  Yes [provider]  midodrine  (PROAMATINE ) 10 MG tablet Take 1 tablet (10 mg total) by mouth every Monday, Wednesday, and Friday at 8 PM. Before hemodialysis Patient taking differently: Take 10 mg by mouth every Monday, Wednesday, and Friday at 8 PM. Before hemodialysis 05/16/22  Yes [place], Eric J, DO  oxyCODONE  (OXY IR/ROXICODONE ) 5 MG immediate release tablet Take 1 tablet (5 mg total) by mouth every 6 (six) hours as needed for severe pain (pain score 7-10). 04/10/23  Yes Samtani, Jai-Gurmukh, MD  OXYGEN  Inhale 4 L into the lungs continuous.   Yes [provider]  pantoprazole  (PROTONIX ) 40 MG tablet Take 1 tablet (40 mg total) by mouth daily. Patient taking differently: Take 40 mg by mouth daily. 11/29/20  Yes Emokpae, Courage, MD  senna (SENOKOT) 8.6 MG TABS tablet Take 2 tablets (17.2 mg total) by mouth daily. 11/13/22  Yes Francella Rogue, MD  sevelamer  (RENAGEL ) 800 MG tablet Take 2 tablets (1,600 mg total) by mouth 3 (three) times daily with meals. 04/10/23  Yes Royal Sill, MD     Vitals:   04/17/23 0905 04/17/23 1021 04/17/23 1425 04/17/23 1445  BP: (!) 108/93 92/68 (!) 118/58 90/66  Pulse:  92 83   Resp: 20 19 18  (!) 29  Temp:  98.2 F (36.8 C) 97.9 F (36.6 C)   TempSrc:  Oral    SpO2: 96% 98% 95%    Exam Gen alert, no distress, drowsy No rash, cyanosis or gangrene Sclera anicteric, throat clear  No jvd or bruits Chest clear bilat to bases, no rales/ wheezing RRR no MRG Abd soft ntnd no mass or  ascites +bs GU defer MS no joint effusions or deformity Ext bilat AKA 1-2+ L stump edema, no R stump edema Neuro is lethargic but awakens and is Ox 3 , nf  [facility] TDC intact    Renal-related home meds: - midodrine  10mg  pre hd mwf - renagel  2 ac tid  - others: asa, insulin , oxy IR, home O2 4L, PPI     OP HD: SW MWF  4h  400/1.5   117.8kg   2/2 bath  [facility] TDC   Heparin  9000 - last OP HD 1/06, post wt 124kg - was making dry wt until last HD 1/06 was 6kg over post  - Na thiosulfate 25 gm three times per week    Hb 11   alb 3.0  Ca 9.4   creat 9.7   K + 6.6    Assessment/ Plan: Infected pressure wound - L buttock / sacral area. Per pmd.  ESRD - on HD MWF. Needs HD today.  Hyperkalemia - giving bicarb, albuterol , IV Ca and po lokelma . Then low K+ bath dialysis today or tonight.  BP - bp's are soft, not on any HTN meds. Takes midodrine  pre HD mwf.  Volume - some L stump edema, no other edema, clear lungs. Pt usually comes off at dry wt and has low gain like 1-2kg. Last HD post wt may have been inaccurate (6kg +). 1-2 L UF goal.  Anemia of esrd - Hb 11, no  esa needs.  Secondary hyperparathyroidism - CCa in range, add on phos. Cont renagel  as binder. No vdra.  Calciphylaxis - remains on sodium thiosulfate  w/ HD mwf. No vit D or Ca products.       Myer Fret  MD [facility] 04/17/2023, 3:45 PM  Recent Labs  Lab 04/15/23 1448 04/17/23 1327  HGB 11.3* 11.7*  ALBUMIN   --  3.0*  CALCIUM   9.1 9.4  CREATININE 7.79* 9.75*  K 4.4 6.6*   Inpatient medications:   ceFEPime  (MAXIPIME ) IV     vancomycin      Followed by   vancomycin 

## 2023-04-17 NOTE — Consult Note (Signed)
 Courtney Gray [birth year]  [id number].    Requesting MD: [place] Chief Complaint/Reason for Consult: Sacral Decub  HPI:  53 y/o F w/a hx of PAD s/p bilateral AKA and ESRD on iHD who presented to the ED with pain and malodorous drainage from a sacral decub.  WBC 18. MAPs have been in the 60s, HR 70s.  She has been AF.   On exam, patient is resting in bed. Endorses sacral pain. She is unable to recall how long she has had the wound. She denies previous debridements.    ROS: Review of Systems  Constitutional:  Positive for malaise/fatigue.  HENT: Negative.    Eyes: Negative.   Respiratory: Negative.    Cardiovascular: Negative.   Gastrointestinal: Negative.   Genitourinary: Negative.   Musculoskeletal: Negative.   Skin:        Sacral wound  Neurological: Negative.   Endo/Heme/Allergies: Negative.   Psychiatric/Behavioral: Negative.      Family History  Problem Relation Age of Onset   Diabetes Mother    Diabetes Father     Past Medical History:  Diagnosis Date   Arthritis    Bacteremia    Calciphylaxis    Chronic HFrEF (heart failure with reduced ejection fraction) (HCC)    Diabetes mellitus without complication [facility])    Dialysis complication, subsequent encounter    ESRD (end stage renal disease) (HCC)    Gout    High cholesterol    Hypertension    Hypertensive emergency    Ischemic foot pain at rest 08/03/2021   Renal disorder    Right atrial mass    Severe mitral regurgitation     Past Surgical History:  Procedure Laterality Date   ABDOMINAL AORTOGRAM W/LOWER EXTREMITY N/A 08/07/2021   Procedure: ABDOMINAL AORTOGRAM W/LOWER EXTREMITY;  Surgeon: Lanis Fonda BRAVO, MD;  Location: [facility] INVASIVE CV LAB;  Service: Cardiovascular;  Laterality: N/A;   AMPUTATION Right 08/14/2021   Procedure: AMPUTATION ABOVE KNEE;  Surgeon: Gretta Lonni PARAS, MD;  Location: [facility] OR;  Service: Vascular;  Laterality: Right;   AMPUTATION Left 09/23/2021   Procedure: AMPUTATION MIDDLE  FINGER;  Surgeon: Romona Harari, MD;  Location: [facility] OR;  Service: Orthopedics;  Laterality: Left;   AMPUTATION Left 10/09/2021   Procedure: LEFT ABOVE KNEE AMPUTATION;  Surgeon: Magda Debby SAILOR, MD;  Location: [facility] OR;  Service: Vascular;  Laterality: Left;   AV FISTULA PLACEMENT     x2, unsuccessful.    BUBBLE STUDY  09/27/2021   Procedure: BUBBLE STUDY;  Surgeon: Pietro Redell RAMAN, MD;  Location: [facility] ENDOSCOPY;  Service: Cardiovascular;;   IR FLUORO GUIDE CV LINE LEFT  12/14/2020   IR FLUORO GUIDE CV LINE LEFT  02/20/2021   IR FLUORO GUIDE CV LINE LEFT  09/11/2021   IR FLUORO GUIDE CV LINE LEFT  10/18/2021   IR FLUORO GUIDE CV LINE LEFT  11/26/2021   IR FLUORO GUIDE CV LINE LEFT  11/28/2021   IR FLUORO GUIDE CV LINE LEFT  02/04/2022   IR FLUORO GUIDE CV LINE LEFT  03/07/2022   IR FLUORO GUIDE CV LINE RIGHT  08/28/2020   IR FLUORO GUIDE CV LINE RIGHT  05/13/2021   IR FLUORO GUIDE CV LINE RIGHT  05/24/2021   IR INTRAVASCULAR ULTRASOUND NON CORONARY  03/07/2022   IR PERC TUN PERIT CATH WO PORT S&I /IMAG  09/06/2020   IR PTA VENOUS EXCEPT DIALYSIS CIRCUIT  02/20/2021   IR PTA VENOUS EXCEPT DIALYSIS CIRCUIT  05/13/2021   IR PTA  VENOUS EXCEPT DIALYSIS CIRCUIT  11/28/2021   IR PTA VENOUS EXCEPT DIALYSIS CIRCUIT  03/07/2022   IR REMOVAL TUN CV CATH W/O FL  05/24/2021   IR REMOVAL TUN CV CATH W/O FL  10/15/2021   IR US  GUIDE VASC ACCESS LEFT  09/06/2020   IR US  GUIDE VASC ACCESS LEFT  09/11/2021   IR US  GUIDE VASC ACCESS LEFT  10/18/2021   IR US  GUIDE VASC ACCESS RIGHT  05/24/2021   IR US  GUIDE VASC ACCESS RIGHT  09/10/2021   IR VENO/JUGULAR RIGHT  09/10/2021   IR VENOCAVAGRAM SVC  02/20/2021   IR VENOCAVAGRAM SVC  11/26/2021   IR VENOCAVAGRAM SVC  03/07/2022   TEE WITHOUT CARDIOVERSION N/A 09/27/2021   Procedure: TRANSESOPHAGEAL ECHOCARDIOGRAM (TEE);  Surgeon: Pietro Redell RAMAN, MD;  Location: [facility] ENDOSCOPY;  Service: Cardiovascular;  Laterality: N/A;   TEE WITHOUT CARDIOVERSION N/A 11/11/2022   Procedure: TRANSESOPHAGEAL  ECHOCARDIOGRAM;  Surgeon: Shlomo Wilbert SAUNDERS, MD;  Location: [facility] INVASIVE CV LAB;  Service: Cardiovascular;  Laterality: N/A;   UPPER EXTREMITY VENOGRAPHY Right 12/04/2021   Procedure: UPPER EXTREMITY VENOGRAPHY;  Surgeon: Lanis Fonda BRAVO, MD;  Location: [facility] INVASIVE CV LAB;  Service: Cardiovascular;  Laterality: Right;    Social History:  reports that she has never smoked. She has never used smokeless tobacco. She reports that she does not drink alcohol  and does not use drugs.  Allergies:  Allergies  Allergen Reactions   Benzonatate Other (See Comments)    Hallucinations   Hydralazine  Other (See Comments)    Hallucinations   Amoxicillin Itching and Nausea And Vomiting   Clonidine Other (See Comments)    Altered mental state, insomnia    Chlorhexidine  Itching    Patient reports it is the CHG baths that cause her itching not the CHG used for caring for her IV/HD access.     Morphine  Itching    Medications Prior to Admission  Medication Sig Dispense Refill   acetaminophen  (TYLENOL ) 325 MG tablet Take 2 tablets (650 mg total) by mouth every 6 (six) hours as needed for mild pain (pain score 1-3) (or Fever >/= 101). (Patient taking differently: Take 650 mg by mouth every 6 (six) hours as needed for mild pain (pain score 1-3) or moderate pain (pain score 4-6) (or Fever >/= 101).)     aspirin  EC 81 MG tablet Take 1 tablet (81 mg total) by mouth daily. Swallow whole. 30 tablet 12   cephALEXin  (KEFLEX ) 500 MG capsule Take 1 capsule (500 mg total) by mouth 2 (two) times daily for 7 days. 14 capsule 0   docusate sodium  (COLACE) 100 MG capsule Take 100 mg by mouth 2 (two) times daily.     glucagon  (GLUCAGEN ) 1 MG SOLR injection Inject 1 mg into the vein once as needed for low blood sugar. Glucose less than 70     insulin  lispro (HUMALOG  KWIKPEN) 100 UNIT/ML KwikPen Inject 1-4 Units into the skin 3 (three) times daily before meals. Per sliding scale: If 251-300=1 unit 301-350=2 unit 351+=4 units Call  MD/ NP if >350     levothyroxine  (SYNTHROID ) 100 MCG tablet Take 100 mcg by mouth daily.     lidocaine  (LIDODERM ) 5 % Place 1 patch onto the skin daily. Remove & Discard patch within 12 hours or as directed by MD     midodrine  (PROAMATINE ) 10 MG tablet Take 1 tablet (10 mg total) by mouth every Monday, Wednesday, and Friday at 8 PM. Before hemodialysis (Patient taking differently: Take 10 mg  by mouth every Monday, Wednesday, and Friday at 8 PM. Before hemodialysis)     oxyCODONE  (OXY IR/ROXICODONE ) 5 MG immediate release tablet Take 1 tablet (5 mg total) by mouth every 6 (six) hours as needed for severe pain (pain score 7-10). 8 tablet 0   OXYGEN  Inhale 4 L into the lungs continuous.     pantoprazole  (PROTONIX ) 40 MG tablet Take 1 tablet (40 mg total) by mouth daily. (Patient taking differently: Take 40 mg by mouth daily.) 30 tablet 5   senna (SENOKOT) 8.6 MG TABS tablet Take 2 tablets (17.2 mg total) by mouth daily.     sevelamer  (RENAGEL ) 800 MG tablet Take 2 tablets (1,600 mg total) by mouth 3 (three) times daily with meals.      Physical Exam: Blood pressure (!) 91/35, pulse 87, temperature 97.7 F (36.5 C), resp. rate 20, SpO2 (!) 76%. Gen: chronically ill appearing female Sacrum: her exam is limited by her habitus but there is a wound right of the midline with a pink and healthy appearing base with a moderate amount of fibrinous exudate. There is a malodorous scent but no surrounding erythema, no fluctuance, no crepitus, and no purulent drainage  Results for orders placed or performed during the hospital encounter of 04/17/23 (from the past 48 hours)  CBC with Differential     Status: Abnormal   Collection Time: 04/17/23  1:27 PM  Result Value Ref Range   WBC 18.1 (H) 4.0 - 10.5 K/uL   RBC 3.57 (L) 3.87 - 5.11 MIL/uL   Hemoglobin 11.7 (L) 12.0 - 15.0 g/dL   HCT 64.0 (L) 63.9 - 53.9 %   MCV 100.6 (H) 80.0 - 100.0 fL   MCH 32.8 26.0 - 34.0 pg   MCHC 32.6 30.0 - 36.0 g/dL   RDW 76.1  (H) 88.4 - 15.5 %   Platelets 69 (L) 150 - 400 K/uL    Comment: SPECIMEN CHECKED FOR CLOTS Immature Platelet Fraction may be clinically indicated, consider ordering this additional test [id number] REPEATED TO VERIFY    nRBC 0.3 (H) 0.0 - 0.2 %   Neutrophils Relative % 87 %   Neutro Abs 15.8 (H) 1.7 - 7.7 K/uL   Lymphocytes Relative 4 %   Lymphs Abs 0.8 0.7 - 4.0 K/uL   Monocytes Relative 7 %   Monocytes Absolute 1.2 (H) 0.1 - 1.0 K/uL   Eosinophils Relative 0 %   Eosinophils Absolute 0.0 0.0 - 0.5 K/uL   Basophils Relative 0 %   Basophils Absolute 0.0 0.0 - 0.1 K/uL   WBC Morphology MORPHOLOGY UNREMARKABLE    RBC Morphology See Note    Smear Review PLATELETS APPEAR DECREASED    Immature Granulocytes 2 %   Abs Immature Granulocytes 0.30 (H) 0.00 - 0.07 K/uL   Target Cells PRESENT     Comment: Performed at [facility], [address], [place], [place] [address]  Comprehensive metabolic panel     Status: Abnormal   Collection Time: 04/17/23  1:27 PM  Result Value Ref Range   Sodium 130 (L) 135 - 145 mmol/L   Potassium 6.6 (HH) 3.5 - 5.1 mmol/L    Comment: HEMOLYSIS AT THIS LEVEL MAY AFFECT RESULT CRITICAL RESULT CALLED TO, READ BACK BY AND VERIFIED WITH J,NEWTON RN @1443  04/17/23 E,BENTON    Chloride 92 (L) 98 - 111 mmol/L   CO2 18 (L) 22 - 32 mmol/L   Glucose, Bld 89 70 - 99 mg/dL    Comment: Glucose reference  range applies only to samples taken after fasting for at least 8 hours.   BUN 62 (H) 6 - 20 mg/dL   Creatinine, Ser 0.24 (H) 0.44 - 1.00 mg/dL   Calcium  9.4 8.9 - 10.3 mg/dL   Total Protein 7.3 6.5 - 8.1 g/dL   Albumin  3.0 (L) 3.5 - 5.0 g/dL   AST 41 15 - 41 U/L    Comment: HEMOLYSIS AT THIS LEVEL MAY AFFECT RESULT   ALT 36 0 - 44 U/L    Comment: HEMOLYSIS AT THIS LEVEL MAY AFFECT RESULT   Alkaline Phosphatase 86 38 - 126 U/L   Total Bilirubin 3.8 (H) 0.0 - 1.2 mg/dL    Comment: HEMOLYSIS AT THIS LEVEL MAY AFFECT RESULT   GFR, Estimated 4 (L) >60 mL/min     Comment: (NOTE) Calculated using the CKD-EPI Creatinine Equation (2021)    Anion gap 20 (H) 5 - 15    Comment: Performed at [facility], [address], [place], [place] [address]  Glucose, capillary     Status: None   Collection Time: 04/17/23  4:54 PM  Result Value Ref Range   Glucose-Capillary 95 70 - 99 mg/dL    Comment: Glucose reference range applies only to samples taken after fasting for at least 8 hours.   DG Chest Port 1 View Result Date: 04/17/2023 CLINICAL DATA:  Cough.  Wound infection. EXAM: PORTABLE CHEST 1 VIEW COMPARISON:  Chest radiograph dated 04/15/2023. FINDINGS: Evaluation is limited due to body habitus. Dialysis catheter in similar position. There is cardiomegaly with mild central vascular congestion. Probable trace bilateral pleural effusions and bibasilar atelectasis. No pneumothorax. Stable cardiomegaly. Atherosclerotic calcification of the aorta. No acute osseous pathology. IMPRESSION: Cardiomegaly with mild central vascular congestion. Electronically Signed   By: Vanetta Chou M.D.   On: 04/17/2023 09:42    Assessment/Plan 53 y/o F w/ a hx of PAD s/p bilateral AKA and ESRD on iHD who presents with a sacral wound   - No indication for urgent surgical intervention.  Her exam is limited by her habitus but there are no obvious signs of necrotic tissue and/or uncontrolled infection based on my exam. I would recommend consulting the wound team for local wound care and IV antibiotics for now.   - CT pelvis to evaluate for any undrained collections - Surgery will follow up the result of imaging  Cordella DELENA Polly Marlis Cheron Surgery 04/17/2023, 5:04 PM Please see Amion for pager number during day hours 7:00am-4:30pm or 7:00am -11:30am on weekends

## 2023-04-17 NOTE — Progress Notes (Signed)
 UF turned off due to bp 77/47. Dr. Arlean Hopping notified.

## 2023-04-17 NOTE — ED Notes (Signed)
 Pt endorses some amnesia and confusion but has been A&O overall

## 2023-04-17 NOTE — Procedures (Signed)
 I was present at the procedure, reviewed the HD regimen and made appropriate changes.   Vinson Moselle MD  [facility] 04/17/2023, 6:28 PM

## 2023-04-17 NOTE — ED Notes (Signed)
 Pt has very positional US  IV in left AC.  It is taped into a position that is working at this time with the catheter partially out of her arm.  As long as her arm is straight her infusion is not occluded at this point.   I began calcium  gluconate prior to Cefipime as part of hyperkalemia protocol

## 2023-04-18 ENCOUNTER — Inpatient Hospital Stay (HOSPITAL_COMMUNITY): Payer: 59

## 2023-04-18 DIAGNOSIS — N186 End stage renal disease: Secondary | ICD-10-CM

## 2023-04-18 DIAGNOSIS — I5020 Unspecified systolic (congestive) heart failure: Secondary | ICD-10-CM

## 2023-04-18 DIAGNOSIS — L8990 Pressure ulcer of unspecified site, unspecified stage: Secondary | ICD-10-CM | POA: Diagnosis not present

## 2023-04-18 DIAGNOSIS — L089 Local infection of the skin and subcutaneous tissue, unspecified: Secondary | ICD-10-CM | POA: Diagnosis not present

## 2023-04-18 DIAGNOSIS — I469 Cardiac arrest, cause unspecified: Secondary | ICD-10-CM

## 2023-04-18 DIAGNOSIS — Z992 Dependence on renal dialysis: Secondary | ICD-10-CM

## 2023-04-18 DIAGNOSIS — Z7189 Other specified counseling: Secondary | ICD-10-CM

## 2023-04-18 DIAGNOSIS — J9611 Chronic respiratory failure with hypoxia: Secondary | ICD-10-CM

## 2023-04-18 DIAGNOSIS — E872 Acidosis, unspecified: Secondary | ICD-10-CM

## 2023-04-18 DIAGNOSIS — I480 Paroxysmal atrial fibrillation: Secondary | ICD-10-CM

## 2023-04-18 DIAGNOSIS — R6521 Severe sepsis with septic shock: Secondary | ICD-10-CM

## 2023-04-18 DIAGNOSIS — A419 Sepsis, unspecified organism: Secondary | ICD-10-CM | POA: Diagnosis not present

## 2023-04-18 DIAGNOSIS — G9340 Encephalopathy, unspecified: Secondary | ICD-10-CM

## 2023-04-18 LAB — BASIC METABOLIC PANEL
Anion gap: 16 — ABNORMAL HIGH (ref 5–15)
BUN: 29 mg/dL — ABNORMAL HIGH (ref 6–20)
CO2: 21 mmol/L — ABNORMAL LOW (ref 22–32)
Calcium: 8.7 mg/dL — ABNORMAL LOW (ref 8.9–10.3)
Chloride: 93 mmol/L — ABNORMAL LOW (ref 98–111)
Creatinine, Ser: 5.8 mg/dL — ABNORMAL HIGH (ref 0.44–1.00)
GFR, Estimated: 8 mL/min — ABNORMAL LOW (ref >60)
Glucose, Bld: 80 mg/dL (ref 70–99)
Potassium: 4.3 mmol/L (ref 3.5–5.1)
Sodium: 130 mmol/L — ABNORMAL LOW (ref 135–145)

## 2023-04-18 LAB — TSH: TSH: 1.451 u[IU]/mL (ref 0.350–4.500)

## 2023-04-18 LAB — LACTIC ACID, PLASMA
Lactic Acid, Venous: 5.4 mmol/L (ref 0.5–1.9)
Lactic Acid, Venous: 5.4 mmol/L (ref 0.5–1.9)
Lactic Acid, Venous: 5.7 mmol/L (ref 0.5–1.9)

## 2023-04-18 LAB — CBC
HCT: 33.6 % — ABNORMAL LOW (ref 36.0–46.0)
Hemoglobin: 11.1 g/dL — ABNORMAL LOW (ref 12.0–15.0)
MCH: 32.8 pg (ref 26.0–34.0)
MCHC: 33 g/dL (ref 30.0–36.0)
MCV: 99.4 fL (ref 80.0–100.0)
Platelets: 59 10*3/uL — ABNORMAL LOW (ref 150–400)
RBC: 3.38 MIL/uL — ABNORMAL LOW (ref 3.87–5.11)
RDW: 23.6 % — ABNORMAL HIGH (ref 11.5–15.5)
WBC: 15.8 10*3/uL — ABNORMAL HIGH (ref 4.0–10.5)
nRBC: 0.4 % — ABNORMAL HIGH (ref 0.0–0.2)

## 2023-04-18 LAB — MRSA NEXT GEN BY PCR, NASAL: MRSA by PCR Next Gen: NOT DETECTED

## 2023-04-18 LAB — GLUCOSE, CAPILLARY
Glucose-Capillary: 127 mg/dL — ABNORMAL HIGH (ref 70–99)
Glucose-Capillary: 70 mg/dL (ref 70–99)
Glucose-Capillary: 71 mg/dL (ref 70–99)
Glucose-Capillary: 74 mg/dL (ref 70–99)
Glucose-Capillary: 74 mg/dL (ref 70–99)
Glucose-Capillary: 90 mg/dL (ref 70–99)
Glucose-Capillary: 98 mg/dL (ref 70–99)

## 2023-04-18 LAB — COOXEMETRY PANEL
Carboxyhemoglobin: 1.2 % (ref 0.5–1.5)
Methemoglobin: <0.7 % (ref 0.0–1.5)
O2 Saturation: 69.1 %
Total hemoglobin: 12.2 g/dL (ref 12.0–16.0)

## 2023-04-18 LAB — CORTISOL: Cortisol, Plasma: 60.4 ug/dL

## 2023-04-18 MED ORDER — NOREPINEPHRINE 16 MG/250ML-% IV SOLN
0.0000 ug/min | INTRAVENOUS | Status: DC
  Administered 2023-04-18: 34 ug/min via INTRAVENOUS
  Filled 2023-04-18 (×2): qty 250

## 2023-04-18 MED ORDER — AMIODARONE HCL IN DEXTROSE 360-4.14 MG/200ML-% IV SOLN
30.0000 mg/h | INTRAVENOUS | Status: DC
  Administered 2023-04-18 (×2): 30 mg/h via INTRAVENOUS
  Filled 2023-04-18 (×3): qty 200

## 2023-04-18 MED ORDER — FENTANYL CITRATE PF 50 MCG/ML IJ SOSY
25.0000 ug | PREFILLED_SYRINGE | Freq: Once | INTRAMUSCULAR | Status: AC
  Administered 2023-04-18: 25 ug via INTRAVENOUS

## 2023-04-18 MED ORDER — ALBUMIN HUMAN 25 % IV SOLN
25.0000 g | Freq: Once | INTRAVENOUS | Status: AC
  Administered 2023-04-18: 25 g via INTRAVENOUS
  Filled 2023-04-18: qty 100

## 2023-04-18 MED ORDER — PHENYLEPHRINE 80 MCG/ML (10ML) SYRINGE FOR IV PUSH (FOR BLOOD PRESSURE SUPPORT)
PREFILLED_SYRINGE | INTRAVENOUS | Status: AC
  Filled 2023-04-18: qty 10

## 2023-04-18 MED ORDER — AMIODARONE LOAD VIA INFUSION
150.0000 mg | Freq: Once | INTRAVENOUS | Status: AC
  Administered 2023-04-18: 150 mg via INTRAVENOUS
  Filled 2023-04-18: qty 83.34

## 2023-04-18 MED ORDER — FENTANYL CITRATE PF 50 MCG/ML IJ SOSY
50.0000 ug | PREFILLED_SYRINGE | Freq: Once | INTRAMUSCULAR | Status: AC
  Filled 2023-04-18: qty 1

## 2023-04-18 MED ORDER — ALBUMIN HUMAN 5 % IV SOLN
25.0000 g | Freq: Once | INTRAVENOUS | Status: AC
  Administered 2023-04-18: 25 g via INTRAVENOUS
  Filled 2023-04-18 (×2): qty 500

## 2023-04-18 MED ORDER — EPINEPHRINE 1 MG/10ML IJ SOSY
PREFILLED_SYRINGE | INTRAMUSCULAR | Status: AC
  Filled 2023-04-18: qty 40

## 2023-04-18 MED ORDER — VANCOMYCIN HCL 1.5 G IV SOLR
1500.0000 mg | Freq: Once | INTRAVENOUS | Status: DC
  Filled 2023-04-18 (×3): qty 30

## 2023-04-18 MED ORDER — MIDODRINE HCL 5 MG PO TABS
10.0000 mg | ORAL_TABLET | Freq: Three times a day (TID) | ORAL | Status: DC
  Administered 2023-04-18 (×2): 10 mg via ORAL
  Filled 2023-04-18 (×2): qty 2

## 2023-04-18 MED ORDER — SODIUM CHLORIDE 0.9 % IV BOLUS
500.0000 mL | Freq: Once | INTRAVENOUS | Status: AC
  Administered 2023-04-18: 500 mL via INTRAVENOUS

## 2023-04-18 MED ORDER — VASOPRESSIN 20 UNITS/100 ML INFUSION FOR SHOCK
INTRAVENOUS | Status: AC
  Administered 2023-04-18: 0.03 [IU]/min via INTRAVENOUS
  Filled 2023-04-18: qty 100

## 2023-04-18 MED ORDER — FENTANYL CITRATE PF 50 MCG/ML IJ SOSY
PREFILLED_SYRINGE | INTRAMUSCULAR | Status: AC
  Filled 2023-04-18: qty 1

## 2023-04-18 MED ORDER — SODIUM BICARBONATE 8.4 % IV SOLN
50.0000 meq | Freq: Once | INTRAVENOUS | Status: AC
  Administered 2023-04-18: 50 meq via INTRAVENOUS

## 2023-04-18 MED ORDER — SODIUM CHLORIDE 0.9 % IV BOLUS
1000.0000 mL | Freq: Once | INTRAVENOUS | Status: AC
  Administered 2023-04-18: 1000 mL via INTRAVENOUS

## 2023-04-18 MED ORDER — AMIODARONE HCL IN DEXTROSE 360-4.14 MG/200ML-% IV SOLN
60.0000 mg/h | INTRAVENOUS | Status: AC
  Administered 2023-04-18 (×2): 60 mg/h via INTRAVENOUS
  Filled 2023-04-18 (×2): qty 200

## 2023-04-18 MED ORDER — VASOPRESSIN 20 UNITS/100 ML INFUSION FOR SHOCK: 0.0000 [IU]/min | INTRAVENOUS | Status: DC

## 2023-04-18 MED ORDER — FENTANYL CITRATE PF 50 MCG/ML IJ SOSY
PREFILLED_SYRINGE | INTRAMUSCULAR | Status: AC
  Administered 2023-04-18: 50 ug via INTRAVENOUS
  Filled 2023-04-18: qty 1

## 2023-04-18 MED ORDER — NALOXONE HCL 0.4 MG/ML IJ SOLN: 0.4000 mg | INTRAMUSCULAR | Status: DC | PRN

## 2023-04-18 MED ORDER — NALOXONE HCL 0.4 MG/ML IJ SOLN
INTRAMUSCULAR | Status: AC
  Administered 2023-04-18: 0.4 mg via INTRAVENOUS
  Filled 2023-04-18: qty 1

## 2023-04-18 MED ORDER — HEPARIN (PORCINE) 25000 UT/250ML-% IV SOLN: 1000.0000 [IU]/h | INTRAVENOUS | Status: DC

## 2023-04-18 MED ORDER — HEPARIN SODIUM (PORCINE) 1000 UNIT/ML DIALYSIS
1000.0000 [IU] | INTRAMUSCULAR | Status: DC | PRN
Start: 2023-04-18 — End: 2023-04-19
  Administered 2023-04-18: 1000 [IU]
  Filled 2023-04-18 (×2): qty 1

## 2023-04-18 MED ORDER — VANCOMYCIN HCL 10 G IV SOLR
1500.0000 mg | Freq: Once | INTRAVENOUS | Status: AC
  Administered 2023-04-18: 1500 mg via INTRAVENOUS
  Filled 2023-04-18: qty 15

## 2023-04-18 MED ORDER — FENTANYL CITRATE PF 50 MCG/ML IJ SOSY: 50.0000 ug | PREFILLED_SYRINGE | Freq: Once | INTRAMUSCULAR | Status: AC

## 2023-04-18 MED ORDER — NOREPINEPHRINE 4 MG/250ML-% IV SOLN
0.0000 ug/min | INTRAVENOUS | Status: DC
  Administered 2023-04-18: 34 ug/min via INTRAVENOUS
  Administered 2023-04-18: 26 ug/min via INTRAVENOUS
  Administered 2023-04-18: 1 ug/min via INTRAVENOUS
  Filled 2023-04-18 (×3): qty 250

## 2023-04-18 MED ORDER — SODIUM BICARBONATE 8.4 % IV SOLN
INTRAVENOUS | Status: AC
  Filled 2023-04-18: qty 150

## 2023-04-18 MED ORDER — SODIUM BICARBONATE 8.4 % IV SOLN
INTRAVENOUS | Status: AC
  Filled 2023-04-18: qty 50

## 2023-04-18 MED ORDER — FENTANYL CITRATE PF 50 MCG/ML IJ SOSY
50.0000 ug | PREFILLED_SYRINGE | Freq: Once | INTRAMUSCULAR | Status: AC
  Administered 2023-04-18: 50 ug via INTRAVENOUS

## 2023-04-18 NOTE — Progress Notes (Addendum)
 CCM consulted about pts inaccurate Bps and this RN voiced that concern to CCM. Asked for placement of Art line for more accurate readings

## 2023-04-18 NOTE — Procedures (Signed)
 Central Venous Catheter Insertion Procedure Note  Courtney Gray  [id number]  July 21, 1970  Date:19-Apr-2023  Time:12:20 PM   Provider Performing:Miral Hoopes JAYSON Sharps   Procedure: Insertion of Non-tunneled Central Venous Catheter(36556) with US  guidance (23062)   Indication(s) Difficult access  Consent Risks of the procedure as well as the alternatives and risks of each were explained to the patient and/or caregiver.  Consent for the procedure was obtained and is signed in the bedside chart  Anesthesia Topical only with 1% lidocaine    Timeout Verified patient identification, verified procedure, site/side was marked, verified correct patient position, special equipment/implants available, medications/allergies/relevant history reviewed, required imaging and test results available.  Sterile Technique Maximal sterile technique including full sterile barrier drape, hand hygiene, sterile gown, sterile gloves, mask, hair covering, sterile ultrasound probe cover (if used).  Procedure Description Area of catheter insertion was cleaned with chlorhexidine  and draped in sterile fashion.  With real-time ultrasound guidance a central venous catheter was placed into the right internal jugular vein. Nonpulsatile blood flow and easy flushing noted in all ports.  The catheter was sutured in place and sterile dressing applied.  Complications/Tolerance Not sure where the line ended up, tough to wire due to prior vascular access issues, collaterals; all lines flushed and drew though Chest X-ray is ordered to verify placement for internal jugular or subclavian cannulation.   Chest x-ray is not ordered for femoral cannulation.  EBL Minimal  Specimen(s) None

## 2023-04-18 NOTE — Progress Notes (Signed)
   May 01, 2023 0434  Assess: MEWS Score  BP (!) 80/50  ECG Heart Rate (!) 116  Resp 20  Assess: MEWS Score  MEWS Temp 0  MEWS Systolic 2  MEWS Pulse 2  MEWS RR 0  MEWS LOC 0  MEWS Score 4  MEWS Score Color Red  Assess: if the MEWS score is Yellow or Red  Were vital signs accurate and taken at a resting state? Yes  Does the patient meet 2 or more of the SIRS criteria? Yes  Does the patient have a confirmed or suspected source of infection? Yes  MEWS guidelines implemented  Yes, red  Treat  MEWS Interventions Considered administering scheduled or prn medications/treatments as ordered  Take Vital Signs  Increase Vital Sign Frequency  Red: Q1hr x2, continue Q4hrs until patient remains green for 12hrs  Escalate  MEWS: Escalate Red: Discuss with charge nurse and notify provider. Consider notifying RRT. If remains red for 2 hours consider need for higher level of care  Notify: Charge Nurse/RN  Name of Charge Nurse/RN Notified Primary School Teacher Notification  Provider Name/Title A. Andrez NP  Date Provider Notified 05/01/23  Time Provider Notified [phone]  Method of Notification Page (SECURED CHAT)  Notification Reason Change in status  Provider response See new orders  Date of Provider Response 2023-05-01  Time of Provider Response 0449  Notify: Rapid Response  Name of Rapid Response RN Notified Mindy RN  Date Rapid Response Notified 05-01-2023  Time Rapid Response Notified 0455  Assess: SIRS CRITERIA  SIRS Temperature  0  SIRS Respirations  0  SIRS Pulse 1  SIRS WBC 0  SIRS Score Sum  1

## 2023-04-18 NOTE — Progress Notes (Signed)
 eLink Physician-Brief Progress Note Patient Name: Courtney Gray DOB: 12/23/1970 MRN: [id number]   Date of Service  05-10-2023  HPI/Events of Note    eICU Interventions     Septic shock Afib RVR Multiple pressors Difficult Vessels (difficult  CVC and currently no A-Line)  Less responsive Unable to obtain BP  We will obtain Stat ABG and EKG And we will discuss with Ground team  Ground team initiated  Continue pressors   Pt is currently without pulse CPR started [place] team in room      [place] 2023-05-10, 10:23 PM

## 2023-04-18 NOTE — Progress Notes (Signed)
   05/16/2023 0432  Assess: MEWS Score  Temp 97.9 F (36.6 C)  BP (!) 82/44  MAP (mmHg) (!) 52  Pulse Rate 76  ECG Heart Rate (!) 117  Resp 18  SpO2 100 %  O2 Device Nasal Cannula  O2 Flow Rate (L/min) 3 L/min  Assess: MEWS Score  MEWS Temp 0  MEWS Systolic 1  MEWS Pulse 2  MEWS RR 0  MEWS LOC 0  MEWS Score 3  MEWS Score Color Yellow  Assess: if the MEWS score is Yellow or Red  Were vital signs accurate and taken at a resting state? Yes  Does the patient meet 2 or more of the SIRS criteria? Yes  Does the patient have a confirmed or suspected source of infection? Yes  MEWS guidelines implemented  Yes, yellow  Treat  MEWS Interventions Considered administering scheduled or prn medications/treatments as ordered  Take Vital Signs  Increase Vital Sign Frequency  Yellow: Q2hr x1, continue Q4hrs until patient remains green for 12hrs  Escalate  MEWS: Escalate Yellow: Discuss with charge nurse and consider notifying provider and/or RRT  Notify: Charge Nurse/RN  Name of Charge Nurse/RN Notified Economist  Provider Notification  Provider Name/Title A. Andrez NP  Date Provider Notified 2023-05-16  Time Provider Notified [phone]  Method of Notification Page (secured chat)  Notification Reason Change in status  Provider response See new orders  Date of Provider Response 2023-05-16  Time of Provider Response 0449  Assess: SIRS CRITERIA  SIRS Temperature  0  SIRS Respirations  0  SIRS Pulse 1  SIRS WBC 0  SIRS Score Sum  1

## 2023-04-18 NOTE — Progress Notes (Addendum)
 PROGRESS NOTE  Courtney Gray  FMW:[id number] DOB: July 11, 1970 DOA: 04/17/2023 PCP: Default, Provider, MD   Brief Narrative: Patient is a  53 y.o. 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 She was also noticed to have severe panniculitis. Lab work showed leucocytosis,hyperkalemia. Patient was on significant pain on presentation.Started on pain management,broad spectrum abx ,cultures obtained. General surgery consulted,no plan for operative invention,wound care consulted.Nephrology consulted for dialysis.  Patient became severely hypotensive last night, went into A-fib with RVR.  PCCM urgently consulted this morning, transferred to [facility] service. TRH will sign off  Assessment & Plan:  Principal Problem:   Infected decubitus ulcer, unspecified ulcer stage  Infected sacral decubitus/Rt groin wound: continue  current abx. Follow up cultures. General surgery/wound care consulted. Obtaining CT pelvis  Septic shock: Likely precipitated by infected decubitus wounds.  Hypertension also contributed by A-fib with RVR.  Continue broad-spectrum antibiotics.  Patient transferred to [facility] service.  Started on midodrine .  Elevated lactic acid level.  Has leukocytosis.  Might need to continue IV albumin .  A-fib with RVR: New problem.  Has been started on heparin  drip, amiodarone  drip.  Cardiology may need to be consulted   ESRD /hyperkalemia: Nephrology following.Given sodium bicarb.  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 (Active)  03/17/23 2000  Location: Thigh  Location Orientation: Posterior;Proximal;Right  Staging: Stage 4 - Full thickness tissue loss with exposed bone, tendon or muscle.  Wound Description (Comments):   Present on Admission: Yes  Dressing Type Gauze (Comment);Foam - Lift dressing to assess site every shift May 06, 2023 0000    DVT prophylaxis:iv heparin      Code Status: Full Code  Family Communication: None at bedside  Patient status:Inpatient  Patient is from :[facility]  Anticipated discharge to:SNF  Estimated DC date:not sure   Antimicrobials:  Anti-infectives (From admission, onward)    Start     Dose/Rate Route Frequency Ordered Stop   04/17/23 1615  vancomycin  (VANCOCIN ) IVPB 1000 mg/200 mL premix       Placed in Followed by Linked Group   1,000 mg 200 mL/hr over 60 Minutes Intravenous  Once 04/17/23 1504     04/17/23 1515  ceFEPIme  (MAXIPIME ) 1 g in sodium chloride  0.9 % 100 mL IVPB        1 g 200 mL/hr over 30 Minutes Intravenous Every evening 04/17/23 1504     04/17/23 1515  vancomycin  (VANCOCIN ) IVPB 1000 mg/200 mL premix       Placed in Followed by Linked Group   1,000 mg 200 mL/hr over 60 Minutes Intravenous  Once 04/17/23 1504 04/17/23 1723       Subjective: Patient seen and examined at the bedside today.  Rapid response was called this morning.  She had become hypotensive last night.  During my evaluation, she was alert and oriented but weak and lethargic.  Systolic blood pressure was  in the range of 60s.  PCCM consulted.  Respiratory status stable.  EKG monitor showed A-fib with RVR.  Objective: Vitals:   2023-04-30 0655 2023-04-30 0704 2023-04-30 0722 2023/04/30 0801  BP: (!) 68/59  (!) 66/32 (!) 79/53  Pulse:   (!) 114   Resp:   16    Temp:   98.9 F (37.2 C)   TempSrc:   Oral   SpO2:      Weight:  119.2 kg      Intake/Output Summary (Last 24 hours) at 04-30-2023 0827 Last data filed at 04/30/2023 0128 Gross per 24 hour  Intake 363 ml  Output 0 ml  Net 363 ml   Filed Weights   30-Apr-2023 0704  Weight: 119.2 kg    Examination:  General exam: Very deconditioned, weak HEENT: PERRL Respiratory system: Diminished air sounds bilaterally, no wheezes or crackles  Cardiovascular system: Irregularly irregular rhythm Gastrointestinal system: Abdomen is nondistended, soft and nontender. Central nervous system: Alert and oriented Extremities: Bilateral AKA Skin: Decubitus wounds, panniculitis. right groin wound, sacral wound   Data Reviewed: I have personally reviewed following labs and imaging studies  CBC: Recent Labs  Lab 04/15/23 1448 04/17/23 1327 April 30, 2023 0452  WBC 13.1* 18.1* 15.8*  NEUTROABS 10.9* 15.8*  --   HGB 11.3* 11.7* 11.1*  HCT 35.4* 35.9* 33.6*  MCV 101.7* 100.6* 99.4  PLT 45* 69* 59*   Basic Metabolic Panel: Recent Labs  Lab 04/15/23 1448 04/17/23 1327 04-30-2023 0452  NA 134* 130* 130*  K 4.4 6.6* 4.3  CL 95* 92* 93*  CO2 23 18* 21*  GLUCOSE 110* 89 80  BUN 38* 62* 29*  CREATININE 7.79* 9.75* 5.80*  CALCIUM  9.1 9.4 8.7*     No results found for this or any previous visit (from the past 240 hours).   Radiology Studies: DG Chest Port 1 View Result Date: 04/17/2023 CLINICAL DATA:  Cough.  Wound infection. EXAM: PORTABLE CHEST 1 VIEW COMPARISON:  Chest radiograph dated 04/15/2023. FINDINGS: Evaluation is limited due to body habitus. Dialysis catheter in similar position. There is cardiomegaly with mild central vascular congestion. Probable trace bilateral pleural effusions and bibasilar atelectasis. No pneumothorax. Stable cardiomegaly. Atherosclerotic calcification of the aorta. No acute osseous pathology. IMPRESSION: Cardiomegaly with mild central vascular congestion.  Electronically Signed   By: Vanetta Chou M.D.   On: 04/17/2023 09:42    Scheduled Meds:  amiodarone   150 mg Intravenous Once   aspirin  EC  81 mg Oral Daily   docusate sodium   100 mg Oral BID   heparin  sodium (porcine)       insulin  aspart  0-5 Units Subcutaneous QHS   insulin  aspart  0-6 Units Subcutaneous TID WC   levothyroxine   100 mcg Oral Q0600   midodrine   10 mg Oral TID WC   pantoprazole   40 mg Oral Daily   senna  2 tablet Oral Daily   sevelamer  carbonate  1,600 mg Oral TID WC   sodium bicarbonate        sodium chloride  flush  3 mL Intravenous Q12H   Continuous Infusions:  albumin  human     amiodarone      Followed by   amiodarone      ceFEPime  (MAXIPIME ) IV Stopped (April 30, 2023 0030)   norepinephrine  (LEVOPHED ) Adult infusion 1 mcg/min (2024/03/28 0820)   vancomycin        LOS: 1 day   Ivonne Mustache, MD [facility] PJan 23, 2025, 8:27 AM

## 2023-04-18 NOTE — Progress Notes (Addendum)
    Patient Name: Benjamin Merrihew           DOB: 1970/09/07  MRN: [id number]      Admission Date: 04/17/2023  Attending Provider: Jillian Buttery, MD  Primary Diagnosis: Infected decubitus ulcer, unspecified ulcer stage   Level of care: Telemetry Medical    CROSS COVER NOTE   Date of Service   05-17-23   Alexiz Cothran, 53 y.o. female, was admitted on 04/17/2023 for Infected decubitus ulcer, unspecified ulcer stage.    HPI/Events of Note   Hypotensive, BP 80/50's Asymptomatic. No acute changes reported.  Currently afebrile, but tachycardic.  Given wound infection, lactic acid ordered.   Hemodialysis earlier tonight, no fluid was removed given hypotensive episodes during dialysis.  On arrival to the floor, BP was normal. Patient received dose of Dilaudid  at 0128.  Hold further sedatives, opioid, antihypertensive agents. No signs of blood loss.  Last hemoglobin 11.7.  Addendum: 9389- Reached out to RN ~1 hour after orders were placed, unfortunately there was a loss of IV which has delayed care.  BP still low, limited to just LUE for BP checks. Patient remains asymptomatic and alert.  No distress.  RN has been instructed to give midodrine  now.  0620-  Lactic acid 5.4 - likely related to hypoperfusion, infection. Adding 500 cc fluid bolus. Recheck.  Hemoglobin stable   Interventions/ Plan   Labs-CBC, BMP, lactic acid IV albumin  Give midodrine  early Fluid bolus, 500 cc        Kamiya Acord, DNP, ACNPC- AG [facility]

## 2023-04-18 NOTE — Progress Notes (Signed)
 Multiple sites checked for more accurate Bps.

## 2023-04-18 NOTE — Progress Notes (Addendum)
 PHARMACY - ANTICOAGULATION and Antibiotic CONSULT NOTE  Courtney Gray is a 53 y.o. female for which pharmacy has been consulted for cefepime  and vancomycin  dosing for  wound infection.   Pharmacy Consult for heparin  Indication: atrial fibrillation    Allergies  Allergen Reactions   Benzonatate Other (See Comments)    Hallucinations   Hydralazine  Other (See Comments)    Hallucinations   Amoxicillin Itching and Nausea And Vomiting   Clonidine Other (See Comments)    Altered mental state, insomnia    Chlorhexidine  Itching    Patient reports it is the CHG baths that cause her itching not the CHG used for caring for her IV/HD access.     Morphine  Itching    Patient Measurements: Weight: 119.2 kg (262 lb 12.6 oz) Heparin  Dosing Weight: 75 kg   Vital Signs: Temp: 97.9 F (36.6 C) 05/04/23 0837) Temp Source: Oral 05-04-2023 0837) BP: 112/63 May 04, 2023 0837) Pulse Rate: 114 04-May-2023 0722)  Labs: Recent Labs    04/15/23 1448 04/17/23 1327 May 04, 2023 0452  HGB 11.3* 11.7* 11.1*  HCT 35.4* 35.9* 33.6*  PLT 45* 69* 59*  CREATININE 7.79* 9.75* 5.80*    Estimated Creatinine Clearance: 9.7 mL/min (A) (by C-G formula based on SCr of 5.8 mg/dL (H)).   Medical History: Past Medical History:  Diagnosis Date   Arthritis    Bacteremia    Calciphylaxis    Chronic HFrEF (heart failure with reduced ejection fraction) (HCC)    Diabetes mellitus without complication (HCC)    Dialysis complication, subsequent encounter    ESRD (end stage renal disease) (HCC)    Gout    High cholesterol    Hypertension    Hypertensive emergency    Ischemic foot pain at rest 08/03/2021   Renal disorder    Right atrial mass    Severe mitral regurgitation     Medications:  Scheduled:   aspirin  EC  81 mg Oral Daily   docusate sodium   100 mg Oral BID   heparin  sodium (porcine)       insulin  aspart  0-5 Units Subcutaneous QHS   insulin  aspart  0-6 Units Subcutaneous TID WC   levothyroxine   100 mcg Oral  Q0600   midodrine   10 mg Oral TID WC   pantoprazole   40 mg Oral Daily   senna  2 tablet Oral Daily   sevelamer  carbonate  1,600 mg Oral TID WC   sodium bicarbonate        sodium chloride  flush  3 mL Intravenous Q12H   Infusions:   amiodarone  60 mg/hr (2023/05/04 0854)   Followed by   amiodarone      ceFEPime  (MAXIPIME ) IV Stopped (04-May-2023 0030)   heparin      norepinephrine  (LEVOPHED ) Adult infusion 4 mcg/min (04-May-2023 0836)   vancomycin      PRN: acetaminophen  **OR** acetaminophen , heparin  sodium (porcine), [COMPLETED]  HYDROmorphone  (DILAUDID ) injection **FOLLOWED BY** HYDROmorphone  (DILAUDID ) injection, ipratropium-albuterol , naloxone , oxyCODONE , sodium bicarbonate   Assessment: New atrial fibrillation. Hb is 11.1, plt low at 59.   Patient with a history of ESRD on HD, s/p bilat AKAs, HFrEF, Severe Mitral regurg, moderate tricuspid regurg, calciphylaxis, known pressure injuries POA, DM, Hypothyroidism, Chronic hypoxic resp failure on 4L, nursing facility resident.   WBC 18.1 >> 15.8; T 97.9; L Acid 5.4. Of note, patient only received 1000 mg of vancomycin  yesterday and then received dialysis immediately after.   Vancomycin  1/10 >>  Cefepime  1/10 >>   Blood cultures sent 05/04/2023   Goal of Therapy:  Heparin  level  0.3-0.7 units/ml Monitor platelets by anticoagulation protocol: Yes  Vancomycin  pre-dialysis levels of 15-25    Plan:  Heparin  consult discontinued, please re-consult as needed  Vancomycin  1500 mg x1, will follow dialysis plans and obtain levels / dose accordingly  Continue cefepime  1000 mg q24h   Rankin Sams May 09, 2023,9:15 AM

## 2023-04-18 NOTE — Code Documentation (Signed)
 Code Blue Note  CPT Code: 07049  Code blue called at 10:30 PM  Initial Rhythm: PEA  Initial Respiratory Status: Agonal  Patient had rapid deterioration in her respiratory status and with agonal respirations became bradycardic followed by PEA arrest per nurse report.  ACLS protocol was underway upon arrival. Assisted ventilation was provided via bag mask. CPR was in progress. Oral airway was placed for bag mask ventilation.  ACLS protocol was continued for approximately 20 minutes. Patient only regained a pulse briefly twice during her resuscitation efforts. After her second loss of pulse with PEA I informed the patient's family and they agreed to cease resuscitation efforts and transition to comfort. With agonal respirations patient was given Fentanyl  50 mcg IV push at my request.  Rhythms treated during procedure:  PEA  Medications used: Bolus Calcium , Bolus Sodium Bicarbonate , Bolus Epinephrine , Levophed  drip & Vasopressin  drip.  Outcome:  Patient still in pulseless electrical activity and transition to comfort care with DNR/DNI. Patient pronounced at 11:06 pm by myself with no palpable pulse, no spontaneous respirations, and no heart beat on auscultation. Patient's family were at bedside with hospital chaplain.  Tonnie DRAFTS Noreen, M.D. [facility] Pulmonary [facility] Critical Care Medicine 11:03 PM 05-03-23   Please see Amion for pager details.  From 7A-7P if no response please call [phone] After hours, please call ELink [phone]

## 2023-04-18 NOTE — Significant Event (Addendum)
 Rapid Response Event Note   IV team unable to obtain peripheral access. In the setting of hypotension, 68/59, and lethargy, Dr. Geralynn confirmed that I could access her hemodialysis catheter.   Catheter accessed. Pt transferred to ICU where she is to have central venous access placed.  MD Notified: Dr. Geralynn 04-May-2023 at 9260  Leonor LITTIE Danker, RN

## 2023-04-18 NOTE — Significant Event (Signed)
 Rapid Response Event Note   Reason for Call :  Asked by TRH NP to assess pt d/t hypotension.  Initial Focused Assessment:  Pt lying in bed with eyes closed. She awakens easily and is oriented but is sleepy and goes back to sleep when not stimulated. Her skin is warm and dry.   T-98.2, HR-121, bp-64/54(60), RR-18, SpO2-100% on 2L [place]  Pt currently has no perifpheral IV access. She does have an HDC.   Interventions:  IV team consulted>Order to use R arm(old HD access location) to attempt IV access  Plan of Care:  IVT coming to bedside to assess R arm for IV access. Await their expertise. Pt has HDC that can be accessed in an emergent situation(supplies to access at bedside). Care handed off to [place], Day shift RRT    Event Summary:   MD Notified: Andrez, NP Call [phone] Arrival Time:0650 End Upfz:9284  Tish Graeme Piety, RN

## 2023-04-18 NOTE — Progress Notes (Signed)
   05-May-2023 2324  Spiritual Encounters  Type of Visit Initial  Care provided to: Pt and family  Conversation partners present during encounter Nurse;Physician  Referral source Code page  Reason for visit Code  OnCall Visit Yes   Reason for Visit: Chaplain responding to Code Blue call  Time of Visit: 60 Minutes  Description of Visit: Chaplain arrived to find code blue still in progress and family members were in the hallway comforting one another.  Chaplain approach family and introduced himself- daughter immediately asked for prayer.  Chaplain escorted family to the waiting room and doctor came to speak to them, explaining that their resuscitation efforts were failing as the Pt was unable to keep a heartbeat on her own.  Family agreed that resuscitation efforts should be halted and Pt allowed to pass in peace.  Family surrounded Pt when she passed and sang.   Chaplain gave family  a list of funeral homes in the [place] area along with a Pt placement card.  Plan of Care: No further spiritual care services needed at this time.  Chaplain services remain available by Spiritual Consult or for emergent cases, paging [phone] [facility], [place]

## 2023-04-18 NOTE — Progress Notes (Addendum)
 Received pt from day shift nurse on 38 mcg of levo, 0.03 of vaso. Titrating from cuff pressures. Dr. Noreen and Norleen Cedar, PA to bedside to assess for arterial line. GOC meeting held with family. At the time of writing, still titrating vasopressors based on cuff pressures. Difficulty obtaining reliable cuff pressures due to limitation to one extremity, patient agitation, and poor vasculature - frequently cuff is unable to obtain measurement, and measurements are ranging from MAP of 32 to MAP of 104 since beginning of shift as shown below. This RN will continue to titrate vasopressors per order based on cuff pressures, no plans to insert arterial line at this time. Per Dr. Nestor, increase levo ceiling to 80 mcg and vaso dose to 0.04.   Addendum 2130: Blood pressure cuff is now unable to obtain measurement, reading double lines on each cycle. Pt agitated, moving in bed, and removing BP cuff. Pt remains on high dose pressors, unable to titrate safely due to no BP measurements. Notified [facility], await response.    04-30-2023 1900 2023-04-30 1911 2023/04/30 1915  Vitals  BP (!) 49/23 102/87 100/86  MAP (mmHg) (!) 32 93 93    04/30/2023 1930 04-30-23 1947 04/30/2023 2035  Vitals  BP 103/88 (!) 84/69 (!) 111/98  MAP (mmHg) 94 76 104    2023-04-30 2037 04-30-2023 2040 04/30/2023 2057  Vitals  BP (!) 64/49 (!) 72/63 (!) 112/91  MAP (mmHg) (!) 55 68 99

## 2023-04-18 NOTE — Progress Notes (Signed)
 Pt's family called and verbal consent received for Art line/ CVC w/ Jacquenette Shone, [place]

## 2023-04-18 NOTE — Procedures (Addendum)
 Central Venous Catheter Insertion Procedure Note  Kaydin Labo  [id number]  02/14/1971  Date:Apr 23, 2023  Time:12:06 PM   Provider Performing:Necola Bluestein A Madilynne Mullan   Procedure: Insertion of Non-tunneled Central Venous [phone]) with US  guidance (23062) -attempt was unsuccessful right IJ and left IJ  Indication(s) Difficult access  Consent Obtain consent from patient and patient's daughter  Anesthesia Topical only with 1% lidocaine    Timeout Verified patient identification, verified procedure, site/side was marked, verified correct patient position, special equipment/implants available, medications/allergies/relevant history reviewed, required imaging and test results available.  Sterile Technique Maximal sterile technique including full sterile barrier drape, hand hygiene, sterile gown, sterile gloves, mask, hair covering, sterile ultrasound probe cover (if used).  Procedure Description  Site was cleaned and draped  Attempted access right IJ-was unsuccessful  Another attempt of left IJ, was able to access the vessel but unable to thread guidewire  Vessels very narrow   No complications

## 2023-04-18 NOTE — Progress Notes (Signed)
 There was the consult for a PIV access regarding hypotensive. Assessed Lt. Arm due to Rt. 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 Communicated with Dr. Geralynn that it is okay to use Rt. Arm for PIV access. Dr. Geralynn is okay with it. No suitable veins on Lt. Arm and only one vein on Rt. Arm AC area. Unable to put in the PIV access, tried twice. Rapid response nurse aware of it and patient is moving to ICU at this time. HS [employer]

## 2023-04-18 NOTE — Consult Note (Signed)
 WOC Nurse Consult Note: Remote consult completed via review of chart and media.  Last seen by [facility] team on last admission, 04/10/23. Will provide topical wound care guidance. Reason for Consult:Patient admitted with chronic unstageable pressure injury to sacrum, infectious.  Infection to abdominal pannus, gangrenous changes to right second digit. Calciphylaxis present as well.  Bilateral BKA.  Wound type:pressure, unstageable to sacrum, surgery consult pending. Intertriginous infection to abdominal pannus.  Pressure Injury POA: Yes  Measurement: WOC team saw in person 04/10/23 Sacral wound measured 5 cm x 4 cm x 4 cm  Abdominal pannus with surrounding erythema, warmth and induration.  Purulent effluent with foul necrotic odor.  Wound bed: devitalized tissue, nonhealing.  Surgery consult pending.   Right second digit with gangrenous changes.  Drainage (amount, consistency, odor) purulence, necrotic odor Periwound: warmth, erythema, induration to abdominal pannus Dressing procedure/placement/frequency: Cleanse sacral, abdominal pannus and finger wounds with VASHE cleanser (LAWSON # [id number]) and pat dry.  VASHE moist gauze to wound bed and top with dry dressing/tape  Change daily.  Will not follow at this time.  Please re-consult if needed.  Courtney Cooley MSN, RN, FNP-BC CWON Wound, Ostomy, Continence Nurse Outpatient [facility] [phone] Pager [phone]

## 2023-04-18 NOTE — Consult Note (Signed)
 NAME:  Courtney Gray, MRN:  [id number], DOB:  12/22/1970, LOS: 1 ADMISSION DATE:  04/17/2023, CONSULTATION DATE:  05/03/2023 REFERRING MD:  Jillian , CHIEF COMPLAINT:  hypotension   History of Present Illness:  53 yo F PMH ESRD on HD, s/p bilat AKAs, HFrEF, Severe Mitral regurg,  moderate tricuspid regurg, calciphylaxis, known pressure injuries POA, DM, Hypothyroidism, Chronic hypoxic resp failure on 4L, nursing facility resident who presented to ED 1/10 from nursing home for eval after pt had worse pain & foul purulent drainage from her groin and sacral wounds. She was admitted to [facility] started on broad abx, underwent HD w nephro, and CCS was consulted for eval -- recommending CT a/p. It does not appear that Bcx were sent at that time.   1/10 night into 05-03-2023 she was hypotensive beginning around 1900 or so, with some variability and at one point a normal BP. She was first noted to be in Afib at 0100. It looks like cross covering team was notified of changes around 0400 and ordered albumin  + midodrine  + IVF -- administration was complicated by IV infiltration. At some point she was also given dilaudid . Rapid was sent to bedside to aid in management. Labs resulted w LA 5.4 affirming hypoperfusion. PCCM was consulted in setting of ongoing hypotension    Pertinent  Medical History  ESRD Bilat AKA DM Hypoxic resp failure Hypothyropidism Chronic wounds  Hx bacteroides Thetaiotaomicron bacteremia  Hepatic and splenic abscess Calciphylaxis   Significant Hospital Events: Including procedures, antibiotic start and stop dates in addition to other pertinent events   1/10 admit [facility] sepsis 2/2 wound infection   Interim History / Subjective:    Objective   Blood pressure 112/63, pulse (!) 114, temperature 97.9 F (36.6 C), temperature source Oral, resp. rate (!) 32, weight 119.2 kg, SpO2 100%.        Intake/Output Summary (Last 24 hours) at 05/03/23 0923 Last data filed at 05-03-2023 0836 Gross per  24 hour  Intake 365.4 ml  Output 0 ml  Net 365.4 ml   Filed Weights   05-03-23 0704  Weight: 119.2 kg    Examination: General: Chronically and critically ill appearing  HENT: NCAT  Lungs: diminished sounds, symmetrical chest expansion  Cardiovascular: irir  Abdomen: Obese, soft, generalized tenderness over pannus  Extremities: bilat AKA. R fingertip dry gangrene  Neuro: Lethargic oriented x2-3 Skin: pink wound under pannus with foul smelling discharge. Unable to visualize sacral injury  GU: defer  Resolved Hospital Problem list     Assessment & Plan:  Acute encephalopathy -sepsis, hypoperfusion P -minimize CNS depressing meds -narcan    Chronic hypoxic resp failure -4L baseline P -IS, titrate O2 as needed   Septic shock -- skin soft tissue infection, panniculitis   Infected pannus wound, reported sacral wound  Lactic acidosis  -infected wounds, POA  P -txf to ICU -NE for MAP > 60 -- currently utilizing her HD cath, will likely need CVC. Probably difficult access w known calciphylaxis  -cont broad abx -send Bcx  -needs to go for her CT a/p  -- d/w receiving charge RN, rapid RN   -add'l albumin   -sending coox, but think this Is grossly driven by septic shock  -as above, narcan  given dilaudid  admin  -trend LA  -1 amp bicarb now   New Afib RVR -likely in setting of septic shock above P -amio ordered 03-May-2023 -- d/w receiving charge nurse & rapid RN to start after we have a sustaining BP w NE  -  optimize lytes -cont cardiac monitoring -EKG  -discuss starting  hep gtt per pharm 2023/05/05 -- AoC thrombocytopenia, plt 69 at present -- favor starting with lower target   HFrEF Hx NSTEMI Severe Mitral regurg  Moderate Tricuspid regurg Severe RA calcification  -not a candidate for MV intervention and BP has limited ability to pursue GDMT for HF. -has previously stated she wouldn't want LHC, RA findings rendered RHC contraindicated  P -check a coox in setting of current  decomp  -volume management via HD  ESRD AGMA  Hyperkalemia, resolved  Hyponatremia  Calciphylaxis  P -iHD (vs CRRT now) per nephro   AoC Anemia AoC thrombocytopenia  P  -trend  S/p Bialt AKA  Physical deconditioning P -PT/OT   Pressure injuries POA  P -WOC -CCS was consulted 1/10 and has rec CT to eval wounds   Code status discussion / GOC -pt's daughter would serve as runner, broadcasting/film/video. Pt shares that they have talked about things like life support before and pt would want everything done that is offered. Given component of encephalopathy, I did try to reach the daughter to confirm this and discuss current change in status but was unable to reach. Full code full scope of care at this point   Best Practice (right click and Reselect all SmartList Selections daily)   Diet/type: NPO DVT prophylaxis systemic heparin  Pressure ulcer(s): present on admission  GI prophylaxis: N/A Lines: Dialysis Catheter Foley:  N/A Code Status:  full code Last date of multidisciplinary goals of care discussion [1/11]  Labs   CBC: Recent Labs  Lab 04/15/23 1448 04/17/23 1327 May 05, 2023 0452  WBC 13.1* 18.1* 15.8*  NEUTROABS 10.9* 15.8*  --   HGB 11.3* 11.7* 11.1*  HCT 35.4* 35.9* 33.6*  MCV 101.7* 100.6* 99.4  PLT 45* 69* 59*    Basic Metabolic Panel: Recent Labs  Lab 04/15/23 1448 04/17/23 1327 05-05-23 0452  NA 134* 130* 130*  K 4.4 6.6* 4.3  CL 95* 92* 93*  CO2 23 18* 21*  GLUCOSE 110* 89 80  BUN 38* 62* 29*  CREATININE 7.79* 9.75* 5.80*  CALCIUM  9.1 9.4 8.7*   GFR: Estimated Creatinine Clearance: 9.7 mL/min (A) (by C-G formula based on SCr of 5.8 mg/dL (H)). Recent Labs  Lab 04/15/23 1448 04/17/23 1327 May 05, 2023 0452 2023-05-05 0541  WBC 13.1* 18.1* 15.8*  --   LATICACIDVEN  --   --   --  5.4*    Liver Function Tests: Recent Labs  Lab 04/17/23 1327  AST 41  ALT 36  ALKPHOS 86  BILITOT 3.8*  PROT 7.3  ALBUMIN  3.0*   No results for input(s):  LIPASE, AMYLASE in the last 168 hours. No results for input(s): AMMONIA in the last 168 hours.  ABG    Component Value Date/Time   PHART 7.432 03/16/2023 2208   PCO2ART 29.1 (L) 03/16/2023 2208   PO2ART 163 (H) 03/16/2023 2208   HCO3 19.4 (L) 03/16/2023 2208   TCO2 19 (L) 04/07/2023 0321   ACIDBASEDEF 4.0 (H) 03/16/2023 2208   O2SAT 100 03/16/2023 2208     Coagulation Profile: No results for input(s): INR, PROTIME in the last 168 hours.  Cardiac Enzymes: No results for input(s): CKTOTAL, CKMB, CKMBINDEX, TROPONINI in the last 168 hours.  HbA1C: Hgb A1c MFr Bld  Date/Time Value Ref Range Status  03/16/2023 10:59 PM 5.1 4.8 - 5.6 % Final    Comment:    (NOTE) Pre diabetes:  5.7%-6.4%  Diabetes:              >6.4%  Glycemic control for   <7.0% adults with diabetes   09/09/2022 08:44 AM 5.1 4.8 - 5.6 % Final    Comment:    (NOTE)         Prediabetes: 5.7 - 6.4         Diabetes: >6.4         Glycemic control for adults with diabetes: <7.0     CBG: Recent Labs  Lab 04/17/23 1654 Apr 20, 2023 0001 20-Apr-2023 0135 04-20-23 0204 04-20-23 0857  GLUCAP 95 70 74 90 71    Review of Systems:   Review of Systems  Constitutional:  Positive for malaise/fatigue.  Eyes: Negative.   Respiratory: Negative.    Cardiovascular: Negative.   Gastrointestinal:  Positive for abdominal pain, constipation, diarrhea and nausea.  Musculoskeletal:  Positive for back pain.  Skin:  Negative for itching.  Neurological:  Positive for weakness.     Past Medical History:  She,  has a past medical history of Arthritis, Bacteremia, Calciphylaxis, Chronic HFrEF (heart failure with reduced ejection fraction) (HCC), Diabetes mellitus without complication (HCC), Dialysis complication, subsequent encounter, ESRD (end stage renal disease) (HCC), Gout, High cholesterol, Hypertension, Hypertensive emergency, Ischemic foot pain at rest (08/03/2021), Renal disorder, Right atrial  mass, and Severe mitral regurgitation.   Surgical History:   Past Surgical History:  Procedure Laterality Date   ABDOMINAL AORTOGRAM W/LOWER EXTREMITY N/A 08/07/2021   Procedure: ABDOMINAL AORTOGRAM W/LOWER EXTREMITY;  Surgeon: Lanis Fonda BRAVO, MD;  Location: [facility] INVASIVE CV LAB;  Service: Cardiovascular;  Laterality: N/A;   AMPUTATION Right 08/14/2021   Procedure: AMPUTATION ABOVE KNEE;  Surgeon: Gretta Lonni PARAS, MD;  Location: [facility] OR;  Service: Vascular;  Laterality: Right;   AMPUTATION Left 09/23/2021   Procedure: AMPUTATION MIDDLE FINGER;  Surgeon: Romona Harari, MD;  Location: [facility] OR;  Service: Orthopedics;  Laterality: Left;   AMPUTATION Left 10/09/2021   Procedure: LEFT ABOVE KNEE AMPUTATION;  Surgeon: Magda Debby SAILOR, MD;  Location: [facility] OR;  Service: Vascular;  Laterality: Left;   AV FISTULA PLACEMENT     x2, unsuccessful.    BUBBLE STUDY  09/27/2021   Procedure: BUBBLE STUDY;  Surgeon: Pietro Redell RAMAN, MD;  Location: [facility] ENDOSCOPY;  Service: Cardiovascular;;   IR FLUORO GUIDE CV LINE LEFT  12/14/2020   IR FLUORO GUIDE CV LINE LEFT  02/20/2021   IR FLUORO GUIDE CV LINE LEFT  09/11/2021   IR FLUORO GUIDE CV LINE LEFT  10/18/2021   IR FLUORO GUIDE CV LINE LEFT  11/26/2021   IR FLUORO GUIDE CV LINE LEFT  11/28/2021   IR FLUORO GUIDE CV LINE LEFT  02/04/2022   IR FLUORO GUIDE CV LINE LEFT  03/07/2022   IR FLUORO GUIDE CV LINE RIGHT  08/28/2020   IR FLUORO GUIDE CV LINE RIGHT  05/13/2021   IR FLUORO GUIDE CV LINE RIGHT  05/24/2021   IR INTRAVASCULAR ULTRASOUND NON CORONARY  03/07/2022   IR PERC TUN PERIT CATH WO PORT S&I /IMAG  09/06/2020   IR PTA VENOUS EXCEPT DIALYSIS CIRCUIT  02/20/2021   IR PTA VENOUS EXCEPT DIALYSIS CIRCUIT  05/13/2021   IR PTA VENOUS EXCEPT DIALYSIS CIRCUIT  11/28/2021   IR PTA VENOUS EXCEPT DIALYSIS CIRCUIT  03/07/2022   IR REMOVAL TUN CV CATH W/O FL  05/24/2021   IR REMOVAL TUN CV CATH W/O FL  10/15/2021   IR US  GUIDE VASC ACCESS LEFT  09/06/2020   IR US  GUIDE VASC ACCESS  LEFT  09/11/2021   IR US  GUIDE VASC ACCESS LEFT  10/18/2021   IR US  GUIDE VASC ACCESS RIGHT  05/24/2021   IR US  GUIDE VASC ACCESS RIGHT  09/10/2021   IR VENO/JUGULAR RIGHT  09/10/2021   IR VENOCAVAGRAM SVC  02/20/2021   IR VENOCAVAGRAM SVC  11/26/2021   IR VENOCAVAGRAM SVC  03/07/2022   TEE WITHOUT CARDIOVERSION N/A 09/27/2021   Procedure: TRANSESOPHAGEAL ECHOCARDIOGRAM (TEE);  Surgeon: Pietro Redell RAMAN, MD;  Location: [facility] ENDOSCOPY;  Service: Cardiovascular;  Laterality: N/A;   TEE WITHOUT CARDIOVERSION N/A 11/11/2022   Procedure: TRANSESOPHAGEAL ECHOCARDIOGRAM;  Surgeon: Shlomo Wilbert SAUNDERS, MD;  Location: [facility] INVASIVE CV LAB;  Service: Cardiovascular;  Laterality: N/A;   UPPER EXTREMITY VENOGRAPHY Right 12/04/2021   Procedure: UPPER EXTREMITY VENOGRAPHY;  Surgeon: Lanis Fonda BRAVO, MD;  Location: [facility] INVASIVE CV LAB;  Service: Cardiovascular;  Laterality: Right;     Social History:   reports that she has never smoked. She has never used smokeless tobacco. She reports that she does not drink alcohol  and does not use drugs.   Family History:  Her family history includes Diabetes in her father and mother.   Allergies Allergies  Allergen Reactions   Benzonatate Other (See Comments)    Hallucinations   Hydralazine  Other (See Comments)    Hallucinations   Amoxicillin Itching and Nausea And Vomiting   Clonidine Other (See Comments)    Altered mental state, insomnia    Chlorhexidine  Itching    Patient reports it is the CHG baths that cause her itching not the CHG used for caring for her IV/HD access.     Morphine  Itching     Home Medications  Prior to Admission medications   Medication Sig Start Date End Date Taking? Authorizing Provider  acetaminophen  (TYLENOL ) 325 MG tablet Take 2 tablets (650 mg total) by mouth every 6 (six) hours as needed for mild pain (pain score 1-3) (or Fever >/= 101). Patient taking differently: Take 650 mg by mouth every 6 (six) hours as needed for mild pain (pain score  1-3) or moderate pain (pain score 4-6) (or Fever >/= 101). 04/10/23  Yes Samtani, Jai-Gurmukh, MD  aspirin  EC 81 MG tablet Take 1 tablet (81 mg total) by mouth daily. Swallow whole. 05/16/22  Yes [place], Eric J, DO  cephALEXin  (KEFLEX ) 500 MG capsule Take 1 capsule (500 mg total) by mouth 2 (two) times daily for 7 days. 04/15/23 04/22/23 Yes Francis Ileana SAILOR, PA-C  docusate sodium  (COLACE) 100 MG capsule Take 100 mg by mouth 2 (two) times daily.   Yes [provider]  glucagon  (GLUCAGEN ) 1 MG SOLR injection Inject 1 mg into the vein once as needed for low blood sugar. Glucose less than 70   Yes [provider]  insulin  lispro (HUMALOG  KWIKPEN) 100 UNIT/ML KwikPen Inject 1-4 Units into the skin 3 (three) times daily before meals. Per sliding scale: If 251-300=1 unit 301-350=2 unit 351+=4 units Call MD/ NP if >350   Yes [provider]  levothyroxine  (SYNTHROID ) 100 MCG tablet Take 100 mcg by mouth daily.   Yes [provider]  lidocaine  (LIDODERM ) 5 % Place 1 patch onto the skin daily. Remove & Discard patch within 12 hours or as directed by MD   Yes [provider]  midodrine  (PROAMATINE ) 10 MG tablet Take 1 tablet (10 mg total) by mouth every Monday, Wednesday, and Friday at 8 PM. Before hemodialysis Patient taking differently:  Take 10 mg by mouth every Monday, Wednesday, and Friday at 8 PM. Before hemodialysis 05/16/22  Yes [place], Camellia PARAS, DO  oxyCODONE  (OXY IR/ROXICODONE ) 5 MG immediate release tablet Take 1 tablet (5 mg total) by mouth every 6 (six) hours as needed for severe pain (pain score 7-10). 04/10/23  Yes Samtani, Jai-Gurmukh, MD  OXYGEN  Inhale 4 L into the lungs continuous.   Yes [provider]  pantoprazole  (PROTONIX ) 40 MG tablet Take 1 tablet (40 mg total) by mouth daily. Patient taking differently: Take 40 mg by mouth daily. 11/29/20  Yes Emokpae, Courage, MD  senna (SENOKOT) 8.6 MG TABS tablet Take 2 tablets (17.2 mg total) by mouth  daily. 11/13/22  Yes Francella Rogue, MD  sevelamer  (RENAGEL ) 800 MG tablet Take 2 tablets (1,600 mg total) by mouth 3 (three) times daily with meals. 04/10/23  Yes Samtani, Jai-Gurmukh, MD     Critical care time: 89 min        CRITICAL CARE Performed by: Ronnald FORBES Gave   Total critical care time: 89 minutes  Critical care time was exclusive of separately billable procedures and treating other patients. Critical care was necessary to treat or prevent imminent or life-threatening deterioration.  Critical care was time spent personally by me on the following activities: development of treatment plan with patient and/or surrogate as well as nursing, discussions with consultants, evaluation of patient's response to treatment, examination of patient, obtaining history from patient or surrogate, ordering and performing treatments and interventions, ordering and review of laboratory studies, ordering and review of radiographic studies, pulse oximetry and re-evaluation of patient's condition.  Ronnald Gave MSN, AGACNP-BC [facility] Amion for pager  2023-04-21, 9:23 AM

## 2023-04-18 NOTE — Plan of Care (Addendum)
 Plan of Care Note  Date Carried Out: May 11, 2023  Location of Meeting: ICU Conference Room  Member's Involved: Physician, patient's daughter, patient's brothers/sister, patient's son by phone, and other family by phone.  Durable Power of Attorney or Acting Medical Decision Maker: Tedi Benders / Daughter  Discussion: We discussed the goals of care for Mrs. Courtney Gray.  We reviewed the patient's multiple medical problems as well as our inability to safely place an arterial catheter to monitor her blood pressure and guide further vasopressor infusion titration.  The patient is currently in a state of septic shock requiring Levophed  and vasopressin  to maintain her mean arterial pressure.  She notably did have atrial fibrillation with rapid ventricular response this admission and also per documentation and family report has had intermittent delirium.  We discussed the fact that given her baseline medical problems including diabetes, peripheral vascular disease, hypothyroidism, and end-stage renal disease on hemodialysis her mortality risk is over 50%.  There is a very high probability that she will have further clinical deterioration and complications and very low probability that she will survive this acute critical illness.  Given that she could have further clinical deterioration we discussed her CODE STATUS.  I explained that in the event of cardiac arrest or respiratory arrest I would not recommend resuscitation or placing the patient on mechanical ventilation as these events would indicate an even higher mortality risk and lower likelihood of surviving.  I answered all of the patient's family members' questions to the best of my ability.  Patient's bedside nurse was updated at the end of our conversation/meeting.  Code Status: Full Code at the end of the meeting.  Disposition: Patient remains in the ICU critically ill.  Time Spent for the Meeting: 22 minutes  Tonnie E. Noreen,  M.D. [facility]monary & Critical Care Medicine 8:45 PM 2023-05-11   Please see Amion for pager details.  From 7A-7P if no response please call [phone] After hours, please call ELink [phone]

## 2023-04-18 NOTE — Plan of Care (Signed)
 Multiple attempts to reach pts daughter unsuccessful re clinical decline, txf to ICU.   Tessie Fass MSN, AGACNP-BC [facility] Pulmonary/Critical Care Medicine 2023-04-22, 8:15 AM

## 2023-04-23 ENCOUNTER — Ambulatory Visit (HOSPITAL_BASED_OUTPATIENT_CLINIC_OR_DEPARTMENT_OTHER): Payer: 59 | Admitting: Internal Medicine

## 2023-04-23 LAB — CULTURE, BLOOD (ROUTINE X 2)
Culture: NO GROWTH
Culture: NO GROWTH

## 2023-05-09 NOTE — Progress Notes (Signed)
Sepsis present on admission

## 2023-05-09 NOTE — Death Summary Note (Signed)
 DEATH SUMMARY   Patient Details  Name: Courtney Gray MRN: [id number] DOB: [birth year]  Admission/Discharge Information   Admit Date:  24-Apr-2023  Date of Death: Date of Death: Apr 25, 2023  Time of Death: Time of Death: 03-20-2305  Length of Stay: 2  Referring Physician: Default, Provider, MD   Reason(s) for Hospitalization  Came into the hospital with foul purulent discharge from her groin and sacral wound  Diagnoses  Preliminary cause of death: Septic shock  Secondary Diagnoses (including complications and co-morbidities):  Principal Problem:   Infected decubitus ulcer, unspecified ulcer stage Active Problems:   Encephalopathy acute   HFrEF (heart failure with reduced ejection fraction) (HCC)   ESRD (end stage renal disease) on dialysis (HCC)   Septic shock (HCC)   Goals of care, counseling/discussion   Paroxysmal atrial fibrillation (HCC)   Chronic hypoxic respiratory failure (HCC) Acute hypoxemic respiratory failure Atrial fibrillation with RVR Coronary artery disease Calciphylaxis Hypothyroidism  Brief Hospital Course (including significant findings, care, treatment, and services provided and events leading to death)  Lynsey Ange is a 53 y.o. year old female who admitted with concern for wound infection Has chronic sacral decubiti and maceration of her groin bilaterally.  She had come in complaining of increasing pain foul-smelling and purulent drainage from her right groin and sacral wound with panniculitis. Started on antibiotics, fluid resuscitation, chronic medications started Rapid response was called for hypotension.  Was treated with fluids, albumin , midodrine . Critical care consulted, patient admitted to ICU, started on pressors via dialysis catheter,-difficult access but central access was eventually obtained.  Increasing pressor requirement.  CT abdomen shows some ascites, evidence of cellulitis in the lower abdomen and chronic sacral wound. Wound nurse evaluation  did reveal 5.4 x 4 cm sacral wound, abdominal pannus with surrounding erythema induration, purulent effluent with foul necrotic odor. Goals of care discussion was had with the family based off of increase in requirement for pressors-with decision to continue current care. Patient however continued to deteriorate and developed rapid deterioration in her respiratory status with agonal respirations following which she had PEA arrest.  Cardiopulmonary resuscitation was provided and was subsequently transition to comfort measures.  She succumbed to her illness at 11:06 PM on 04-25-2023  Cause of death septic shock     Pertinent Labs and Studies  Significant Diagnostic Studies CT PELVIS WO CONTRAST Result Date: 04/25/23 CLINICAL DATA:  Infected sacral decubitus ulcer.  Sepsis. EXAM: CT PELVIS WITHOUT CONTRAST TECHNIQUE: Multidetector CT imaging of the pelvis was performed following the standard protocol without intravenous contrast. RADIATION DOSE REDUCTION: This exam was performed according to the departmental dose-optimization program which includes automated exposure control, adjustment of the mA and/or kV according to patient size and/or use of iterative reconstruction technique. COMPARISON:  02/17/2022 FINDINGS: Urinary Tract:  Bladder is decompressed. Bowel: Visualized small bowel and colon are mostly decompressed with scattered stool in the rectosigmoid region. No wall thickening or inflammatory changes are appreciated. Appendix is normal. Vascular/Lymphatic: Prominent aortoiliac calcifications. No aneurysm. Reproductive: Uterus is atrophic. Ovaries are not enlarged. No abnormal adnexal masses. Other: Small amount of free fluid and stranding along the pericolic gutters and into the pelvis. Etiology is nonspecific. No free air. No loculated collections are identified. Infiltration and skin thickening in the anterior abdominal wall possibly cellulitis. No loculated collections. Musculoskeletal: Decubitus  ulcer posteriorly on the right just below the level of the pubic rami. Ulceration extends for a depth of about 4.6 cm. Small amount of air along the tract. Increased density at the  skin surface is probably packing material. Fistula extends to the base of the posterior right ischium. No underlying bone destruction to suggest osteomyelitis. No loculated collection IMPRESSION: 1. Right posterior decubitus ulcer extending to the base of the posterior right ischium. No evidence of osteomyelitis or abscess. 2. Skin thickening and infiltration in the subcutaneous fat of the anterior abdominal wall, possibly cellulitis. No abscess. 3. Small amount of free fluid in the pelvis and along the pericolic gutters of nonspecific etiology. 4. Prominent aortic atherosclerosis. Electronically Signed   By: Elsie Gravely M.D.   On: 05-17-2023 19:20   DG Chest Port 1 View Result Date: May 17, 2023 CLINICAL DATA:  Central line placement. EXAM: PORTABLE CHEST 1 VIEW COMPARISON:  Chest x-ray from yesterday. FINDINGS: New right internal jugular approach central venous catheter crossing the midline in the lower neck, presumably through a collateral vessel, with the tip in the left brachiocephalic vein. Unchanged tunneled left internal jugular dialysis catheter with the tip in the right atrium. Stable cardiomegaly. Normal pulmonary vascularity. Similar low lung volumes with bibasilar atelectasis. No pneumothorax or large pleural effusion. No acute osseous abnormality. IMPRESSION: 1. New right internal jugular approach central venous catheter crossing the midline in the lower neck, presumably through a collateral vessel, with the tip in the left brachiocephalic vein. Recommend repositioning. 2. No pneumothorax. Unchanged low lung volumes with bibasilar atelectasis. Electronically Signed   By: Elsie ONEIDA Shoulder M.D.   On: 2023-05-17 13:00   DG Chest Port 1 View Result Date: 04/17/2023 CLINICAL DATA:  Cough.  Wound infection. EXAM: PORTABLE  CHEST 1 VIEW COMPARISON:  Chest radiograph dated 04/15/2023. FINDINGS: Evaluation is limited due to body habitus. Dialysis catheter in similar position. There is cardiomegaly with mild central vascular congestion. Probable trace bilateral pleural effusions and bibasilar atelectasis. No pneumothorax. Stable cardiomegaly. Atherosclerotic calcification of the aorta. No acute osseous pathology. IMPRESSION: Cardiomegaly with mild central vascular congestion. Electronically Signed   By: Vanetta Chou M.D.   On: 04/17/2023 09:42   DG Chest 2 View Result Date: 04/15/2023 CLINICAL DATA:  missed dialysis today, eval fluid overload EXAM: CHEST - 2 VIEW COMPARISON:  04/07/2023. FINDINGS: Low lung volume. Mild diffuse pulmonary vascular congestion, which may be due to volume overload. However, no frank pulmonary edema. Left retrocardiac opacity may represent atelectasis and/or consolidation. Bilateral lung fields are otherwise clear. No large pleural effusion or pneumothorax. Stable cardio-mediastinal silhouette. No acute osseous abnormalities. The soft tissues are within normal limits. Left IJ hemodialysis catheter noted with its tip overlying the cavoatrial junction region. IMPRESSION: *Mild diffuse pulmonary vascular congestion, which may be due to volume overload. No frank pulmonary edema. Electronically Signed   By: Ree Molt M.D.   On: 04/15/2023 16:01   CT HEAD WO CONTRAST ( ) Result Date: 04/07/2023 CLINICAL DATA:  Delirium EXAM: CT HEAD WITHOUT CONTRAST TECHNIQUE: Contiguous axial images were obtained from the base of the skull through the vertex without intravenous contrast. RADIATION DOSE REDUCTION: This exam was performed according to the departmental dose-optimization program which includes automated exposure control, adjustment of the mA and/or kV according to patient size and/or use of iterative reconstruction technique. COMPARISON:  03/16/2023 FINDINGS: Brain: No evidence of acute infarction,  hemorrhage, hydrocephalus, extra-axial collection or mass lesion/mass effect. Calcification along the right sylvian fissure that was also seen on prior and is nonspecific in isolation. No associated infarct to imply arterial embolic disease. Vascular: No hyperdense vessel. Extensive arterial calcification in the skull base and scalp. Skull: No acute or aggressive finding. Sinuses/Orbits:  No emergent finding IMPRESSION: No acute or interval finding. Electronically Signed   By: Dorn Roulette M.D.   On: 04/07/2023 04:45   DG Chest Port 1 View Result Date: 04/07/2023 CLINICAL DATA:  Altered mental status.  Shortness of breath. EXAM: PORTABLE CHEST 1 VIEW COMPARISON:  Radiograph 03/16/2023 FINDINGS: Left-sided dialysis catheter unchanged in position. Cardiomegaly is unchanged. Stable mediastinal contours. Worsening hazy perihilar opacities suspicious for edema. Possible pleural effusions, not well assessed due to habitus. No pneumothorax. IMPRESSION: Cardiomegaly with worsening hazy perihilar opacities suspicious for edema. Possible pleural effusions. Electronically Signed   By: Andrea Gasman M.D.   On: 04/07/2023 04:25    Microbiology Recent Results (from the past 240 hours)  Culture, blood (Routine X 2) w Reflex to ID Panel     Status: None (Preliminary result)   Collection Time: 2023/05/02  9:06 AM   Specimen: BLOOD  Result Value Ref Range Status   Specimen Description BLOOD SITE NOT SPECIFIED  Final   Special Requests   Final    BOTTLES DRAWN AEROBIC AND ANAEROBIC Blood Culture results may not be optimal due to an inadequate volume of blood received in culture bottles   Culture   Final    NO GROWTH < 24 HOURS Performed at [facility], [address], [place], [place] [address]    Report Status PENDING  Incomplete  Culture, blood (Routine X 2) w Reflex to ID Panel     Status: None (Preliminary result)   Collection Time: 2023-05-02  9:33 AM   Specimen: BLOOD RIGHT HAND  Result Value Ref  Range Status   Specimen Description BLOOD RIGHT HAND  Final   Special Requests   Final    BOTTLES DRAWN AEROBIC ONLY Blood Culture results may not be optimal due to an inadequate volume of blood received in culture bottles   Culture   Final    NO GROWTH < 24 HOURS Performed at [facility], [address], [place], [place] [address]    Report Status PENDING  Incomplete  MRSA Next Gen by PCR, Nasal     Status: None   Collection Time: 02-May-2023  1:21 PM   Specimen: Nasal Mucosa; Nasal Swab  Result Value Ref Range Status   MRSA by PCR Next Gen NOT DETECTED NOT DETECTED Final    Comment: (NOTE) The GeneXpert MRSA Assay (FDA approved for NASAL specimens only), is one component of a comprehensive MRSA colonization surveillance program. It is not intended to diagnose MRSA infection nor to guide or monitor treatment for MRSA infections. Test performance is not FDA approved in patients less than 75 years old. Performed at [facility], [address] [place], [place] [address]     Lab Basic Metabolic Panel: Recent Labs  Lab 04/15/23 1448 04/17/23 1327 2023-05-02 0452  NA 134* 130* 130*  K 4.4 6.6* 4.3  CL 95* 92* 93*  CO2 23 18* 21*  GLUCOSE 110* 89 80  BUN 38* 62* 29*  CREATININE 7.79* 9.75* 5.80*  CALCIUM  9.1 9.4 8.7*   Liver Function Tests: Recent Labs  Lab 04/17/23 1327  AST 41  ALT 36  ALKPHOS 86  BILITOT 3.8*  PROT 7.3  ALBUMIN  3.0*   No results for input(s): LIPASE, AMYLASE in the last 168 hours. No results for input(s): AMMONIA in the last 168 hours. CBC: Recent Labs  Lab 04/15/23 1448 04/17/23 1327 05/02/2023 0452  WBC 13.1* 18.1* 15.8*  NEUTROABS 10.9* 15.8*  --  HGB 11.3* 11.7* 11.1*  HCT 35.4* 35.9* 33.6*  MCV 101.7* 100.6* 99.4  PLT 45* 69* 59*   Cardiac Enzymes: No results for input(s): CKTOTAL, CKMB, CKMBINDEX, TROPONINI in the last 168 hours. Sepsis Labs: Recent Labs  Lab 04/15/23 1448 04/17/23 1327 18-May-2023 0452  05/18/23 0541 05-18-23 0852 May 18, 2023 1311  WBC 13.1* 18.1* 15.8*  --   --   --   LATICACIDVEN  --   --   --  5.4* 5.4* 5.7*    Procedures/Operations     Ayah Cozzolino A Dorissa Stinnette 04/19/2023, 1:37 PM

## 2023-05-09 DEATH — deceased

## 2023-05-28 ENCOUNTER — Ambulatory Visit: Payer: 59 | Admitting: Cardiology

## 2023-06-04 MED FILL — Medication: Qty: 1 | Status: AC

## 2024-02-22 IMAGING — DX DG FINGER MIDDLE 2+V*L*
3 series · 3 of 3 positions shown · non-contrast
Comparison: None Available.

CLINICAL DATA: [id number], ischemic ulcer at the tip of the left middle
finger for a few weeks, evaluate for osteomyelitis

EXAM:
LEFT MIDDLE FINGER three views

[finger ap]
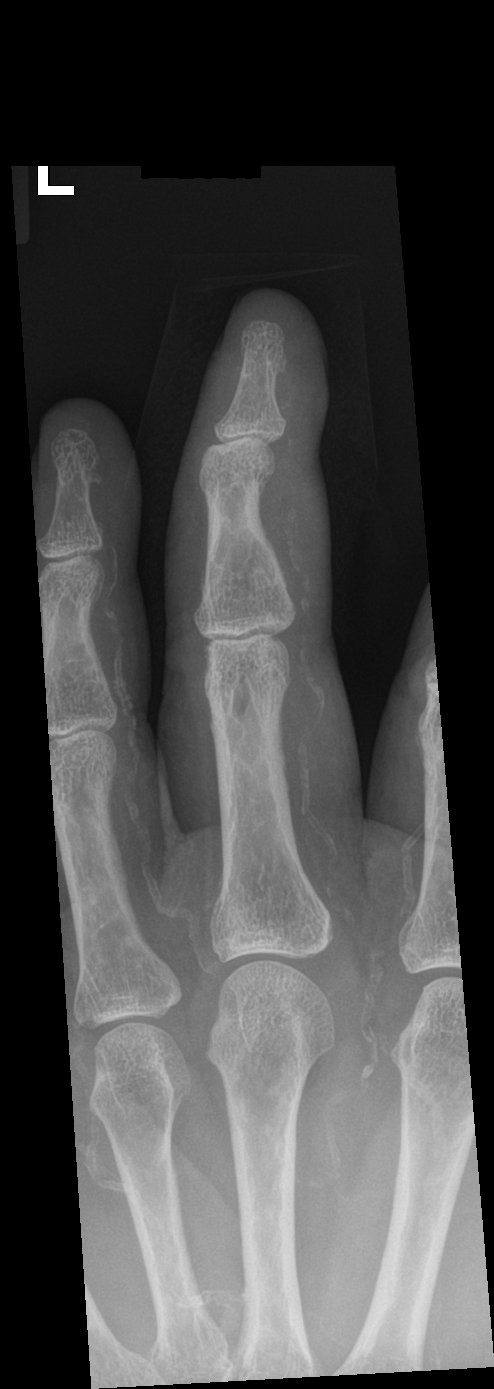

[finger obl]
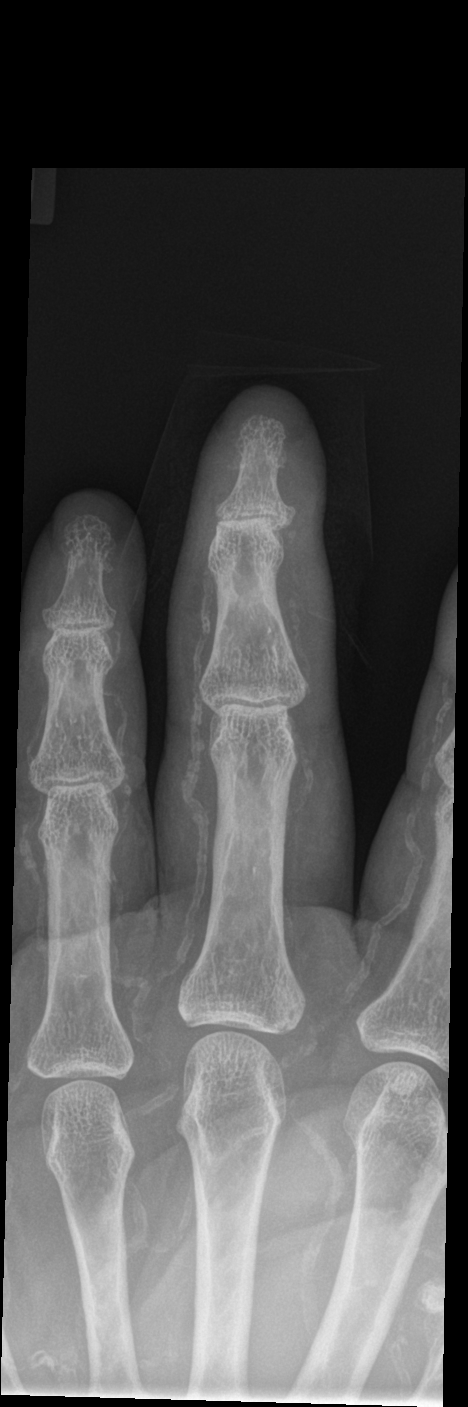

[finger lat]
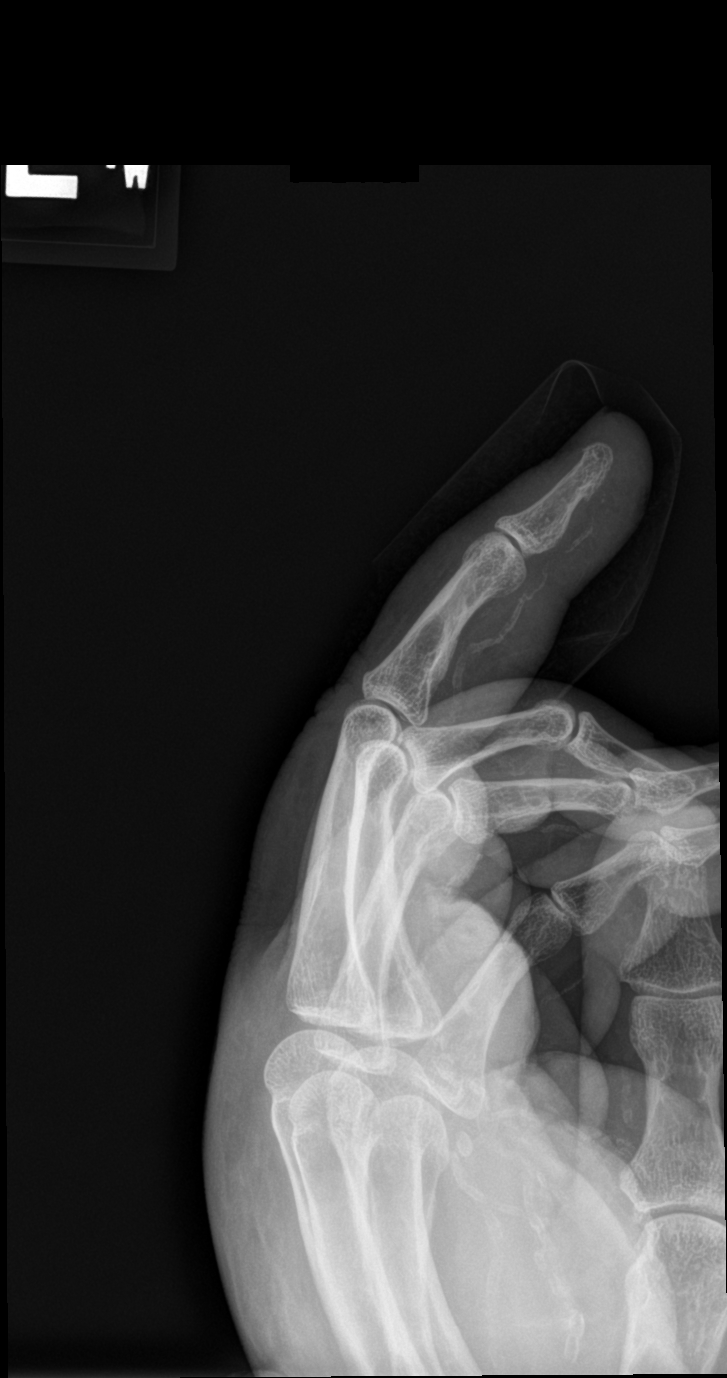

[3 of 3 positions shown; findings below may reference images not displayed]

FINDINGS: The phalanges with attention of the distal phalanx of the middle
finger have a normal appearance. Mild arthropathy and is more
prominent at the distal interphalangeal joint. There is a small flat
soft tissue defect at the pulp of the middle finger of likely known
ulcer. There is mild tapering seen at the tip of the middle finger.
Vascular calcifications.
IMPRESSION: Phalanges of the middle finger with attention to the distal phalanx
have a normal appearance without evidence of osteomyelitis. Mild
arthropathy and is most prominent at the DIP. Mild tapering of the
pulp of the middle finger. Vascular calcifications.

## 2024-02-22 IMAGING — DX DG CHEST 1V PORT
1 series · 2 of 2 positions shown · non-contrast
Comparison: 09/10/2021

CLINICAL DATA: Acute respiratory difficulty

EXAM:
PORTABLE CHEST 1 VIEW

[Series 1: chest ap · 0.14mm/px · 2 of 2 slices shown]
[im 1/2]
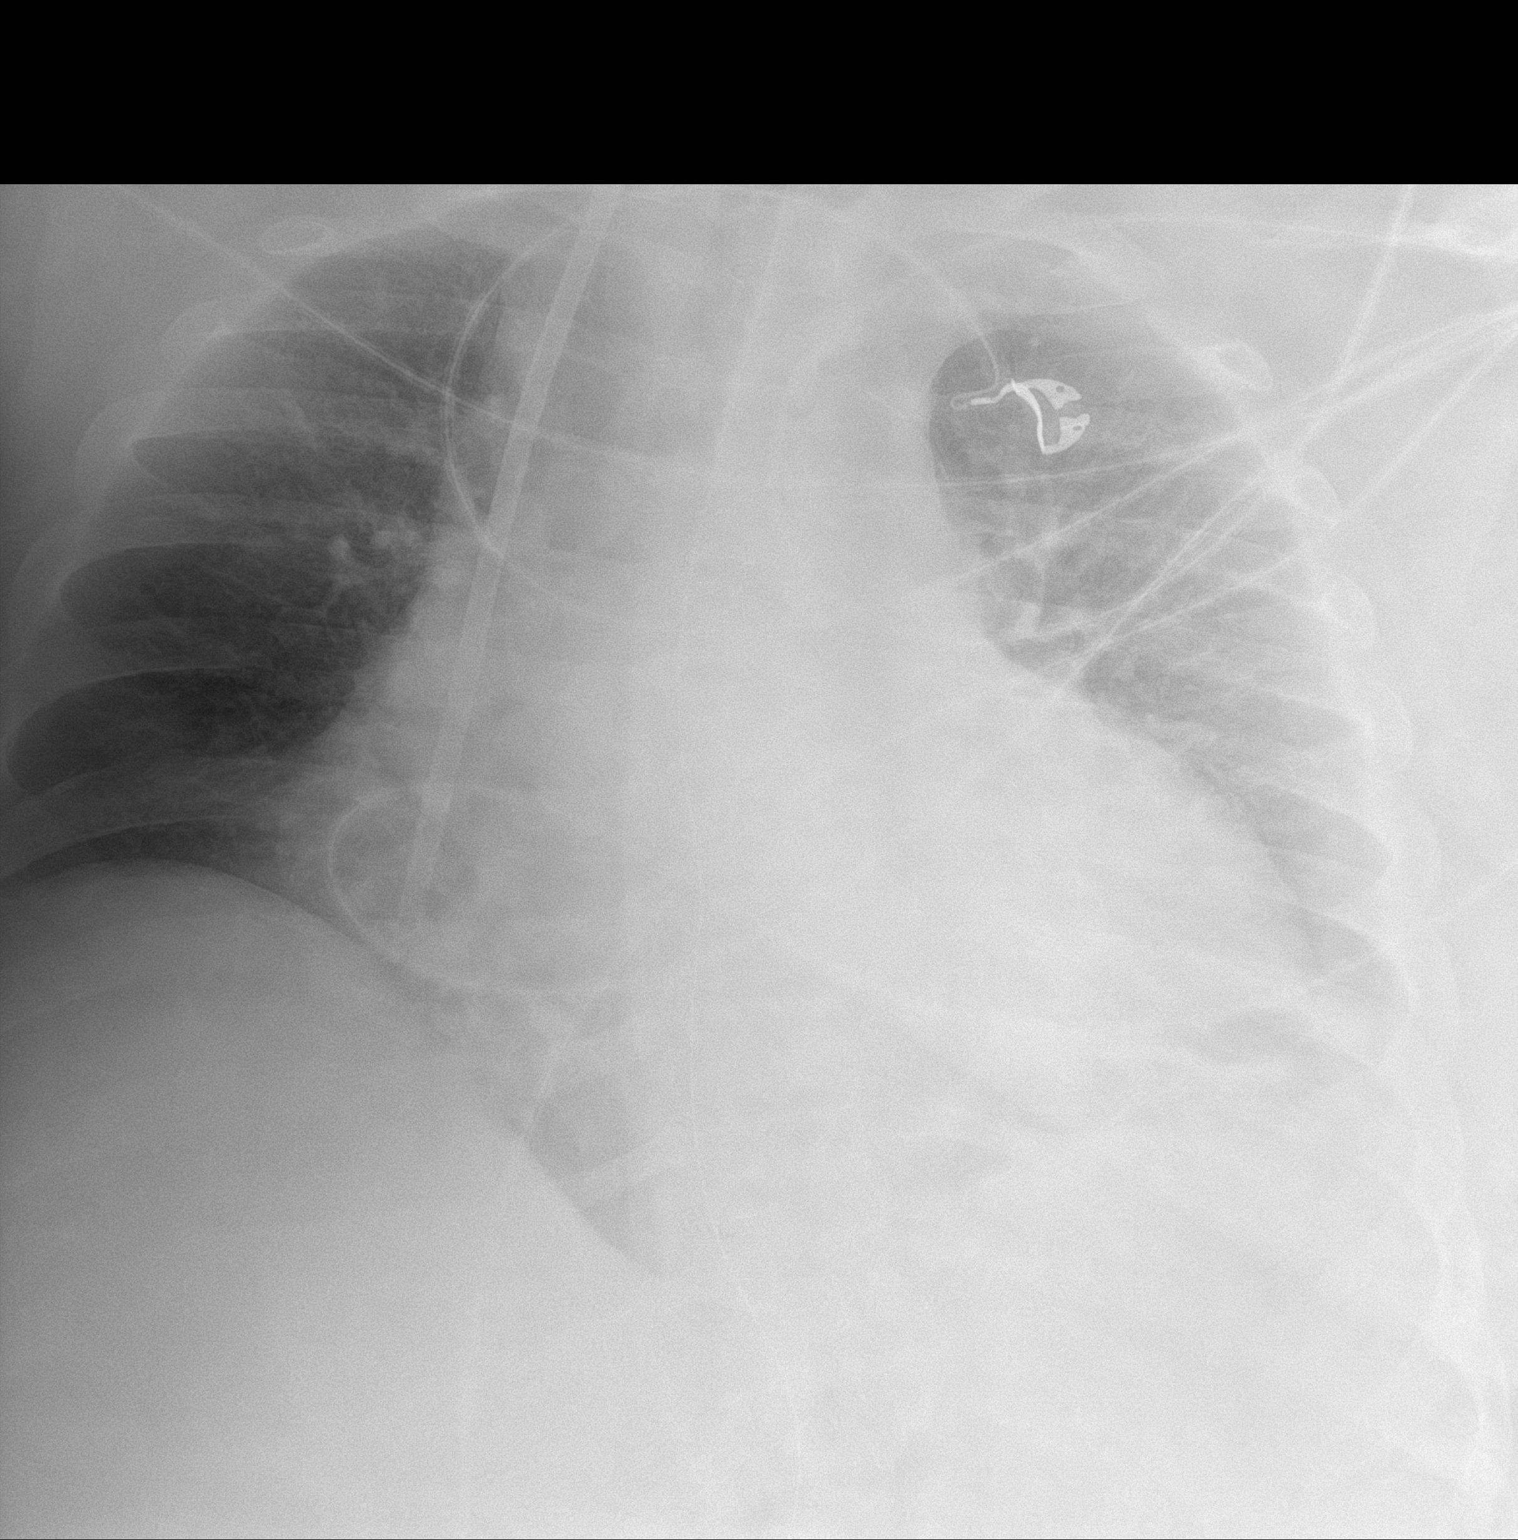
[im 2/2]
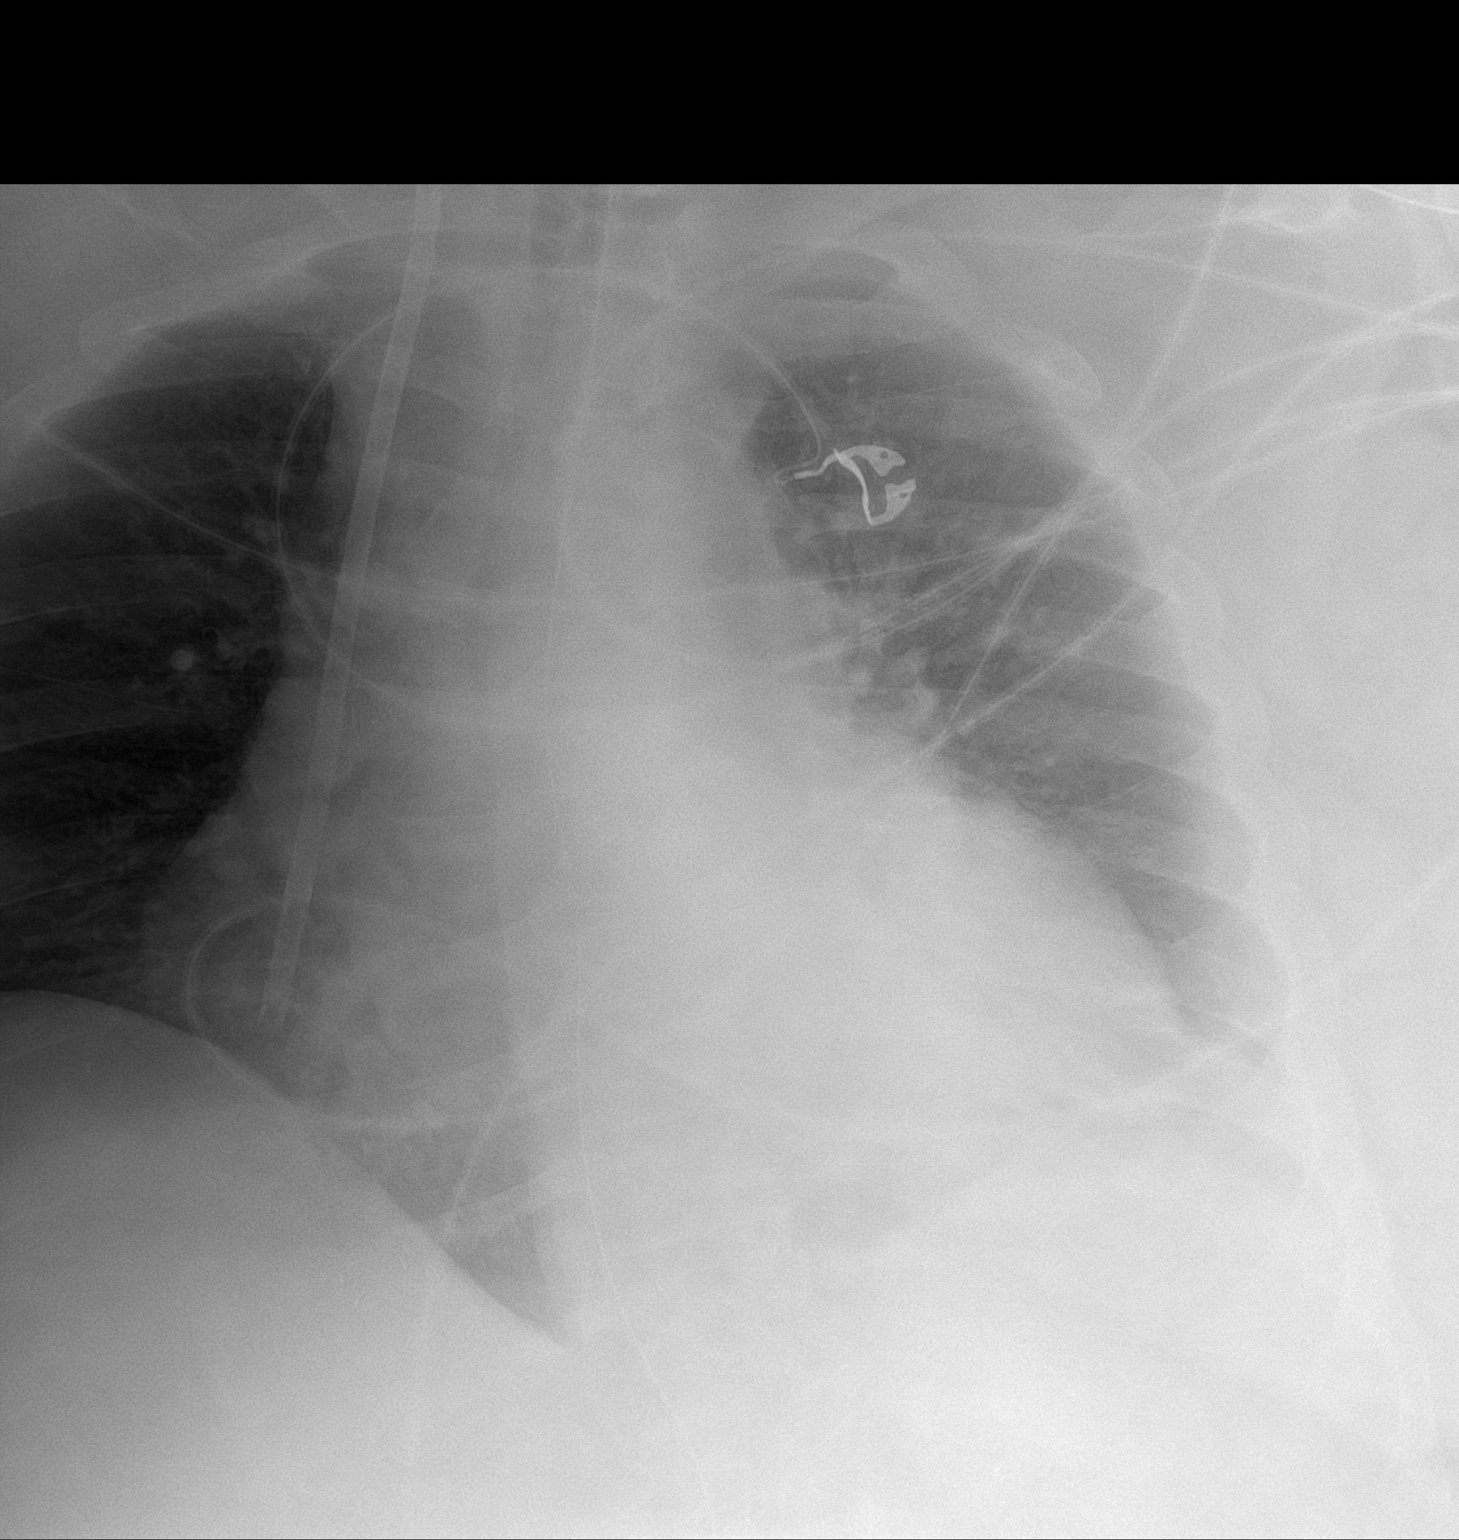

[2 of 2 positions shown; findings below may reference images not displayed]

FINDINGS: Cardiac shadow is enlarged but stable. Gastric catheter is noted
extending into the stomach. Right jugular dialysis catheter is again
seen and stable. The lungs show mild left basilar atelectasis. No
pneumothorax is seen. The recently placed right midline catheter is
not appreciated on this exam.
IMPRESSION: Mild left basilar atelectasis.

## 2024-02-23 IMAGING — CT CT ANGIO CHEST
2 of 5 series · 15 of 46 positions shown · IV contrast (APPLIED)
Comparison: None Available.
COMPARISON: None Available.

Addendum:
CLINICAL DATA: Evaluate for embolism.  Chest pain.

EXAM:
CT ANGIOGRAPHY CHEST WITH CONTRAST
TECHNIQUE: Multidetector CT imaging of the chest was performed using the
standard protocol during bolus administration of intravenous
contrast. Multiplanar CT image reconstructions and MIPs were
obtained to evaluate the vascular anatomy.

[Series 5: dissection 3.0 i30f 3 · axial · 0.98mm/px · z∈[+1238,+1538]mm · 12 of 116 slices shown]
[im 8/116  lung]
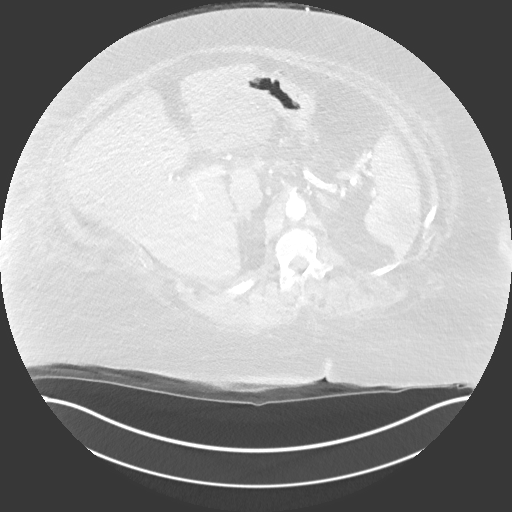
[im 15/116  soft-tissue]
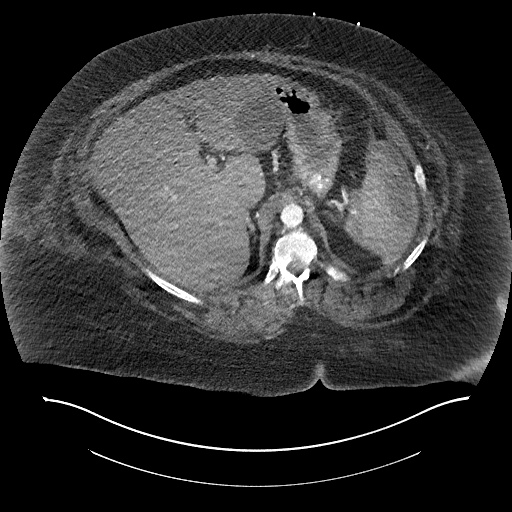
[im 26/116  lung]
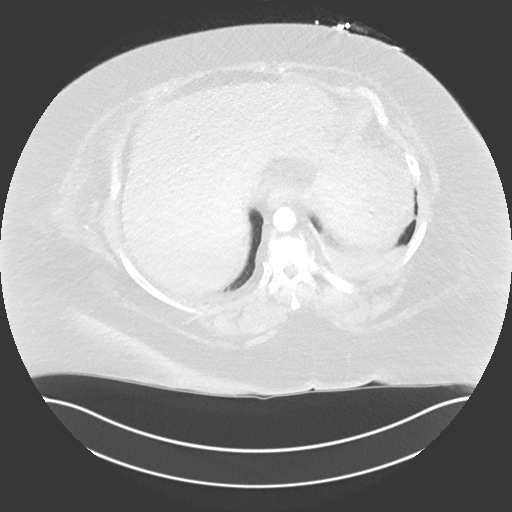
[im 34/116  soft-tissue]
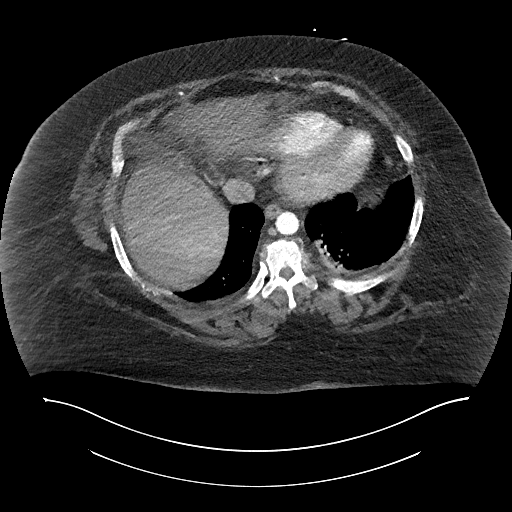
[im 45/116  lung]
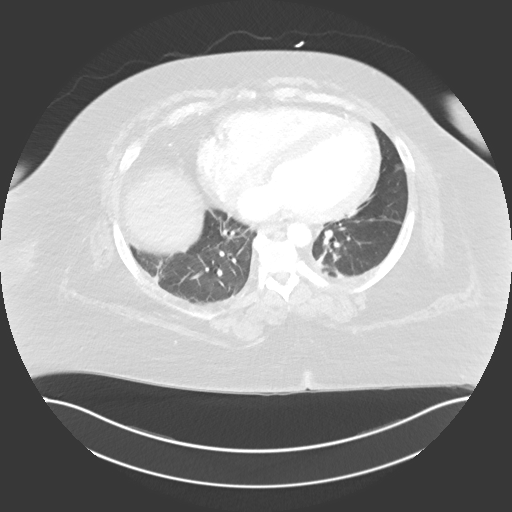
[im 52/116  soft-tissue]
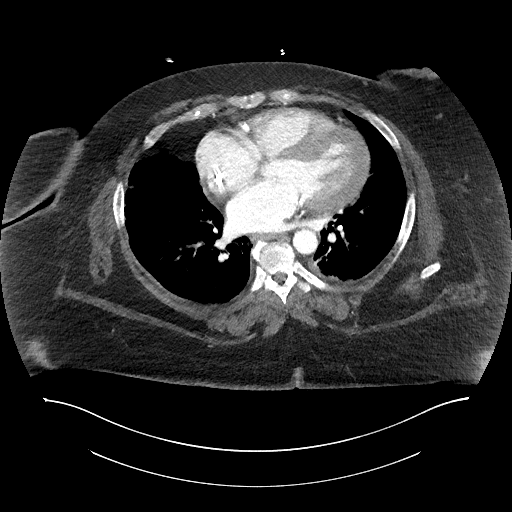
[im 64/116  lung]
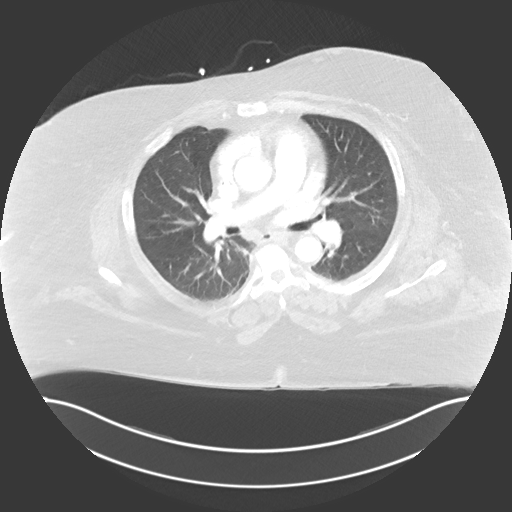
[im 71/116  soft-tissue]
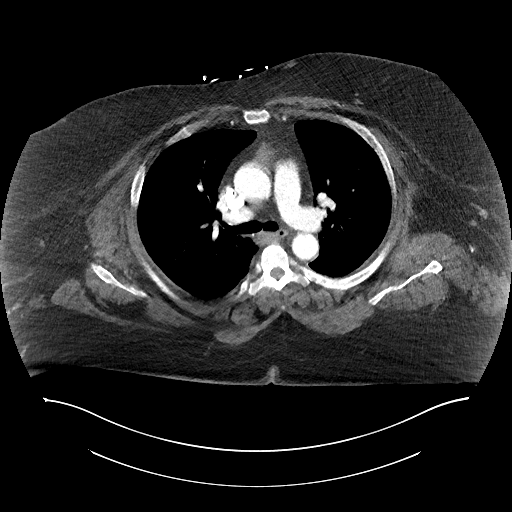
[im 82/116  lung]
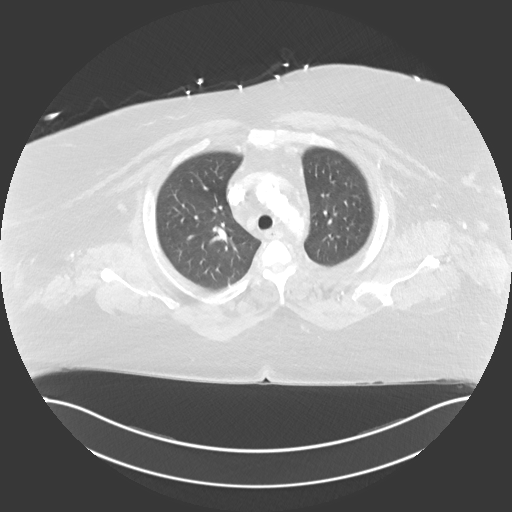
[im 90/116  soft-tissue]
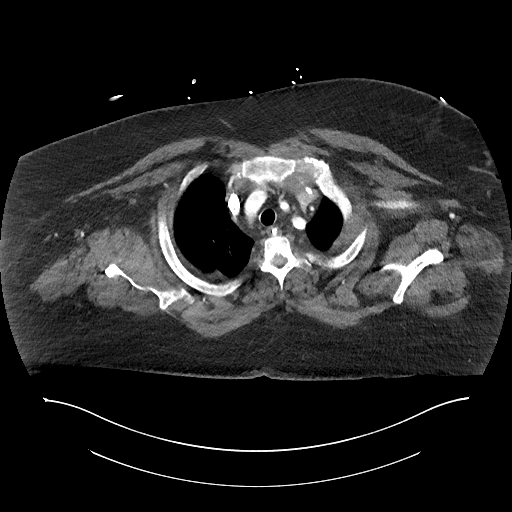
[im 101/116  lung]
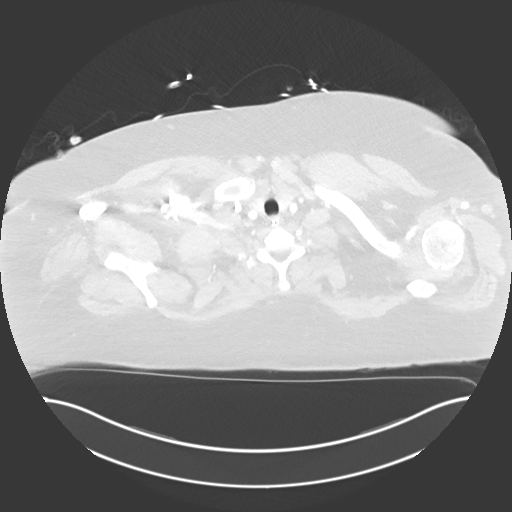
[im 108/116  soft-tissue]
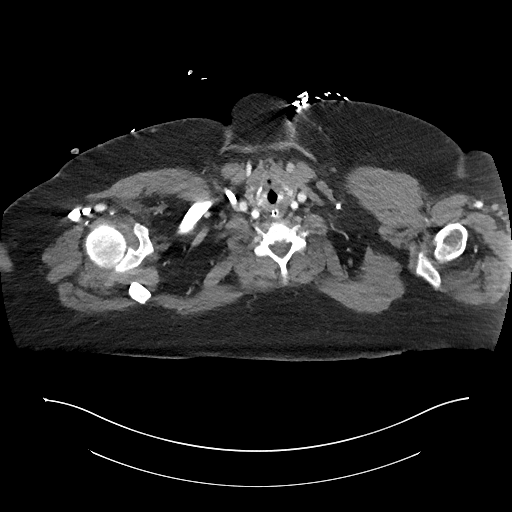

[Series 8: coronals · coronal · 0.72mm/px · 3 of 200 slices shown]
[im 50/200  soft-tissue]
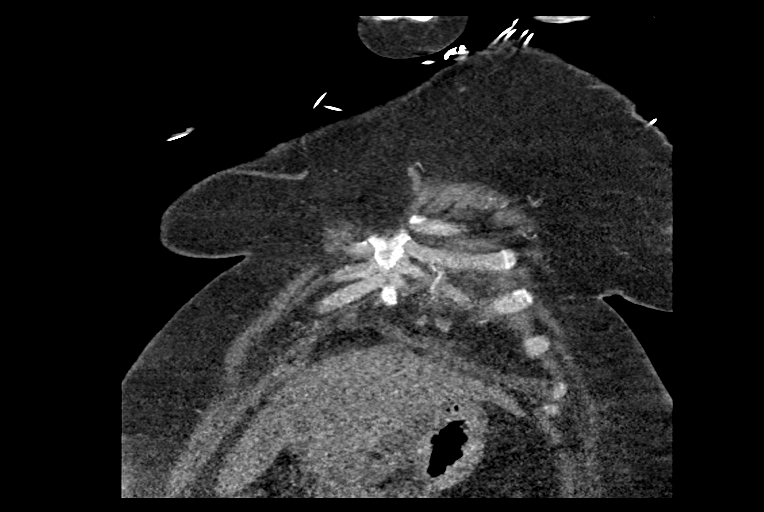
[im 100/200  soft-tissue]
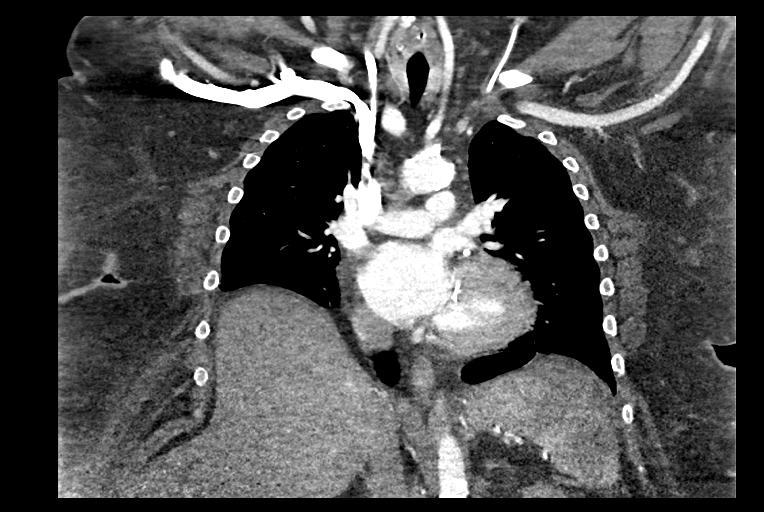
[im 150/200  soft-tissue]
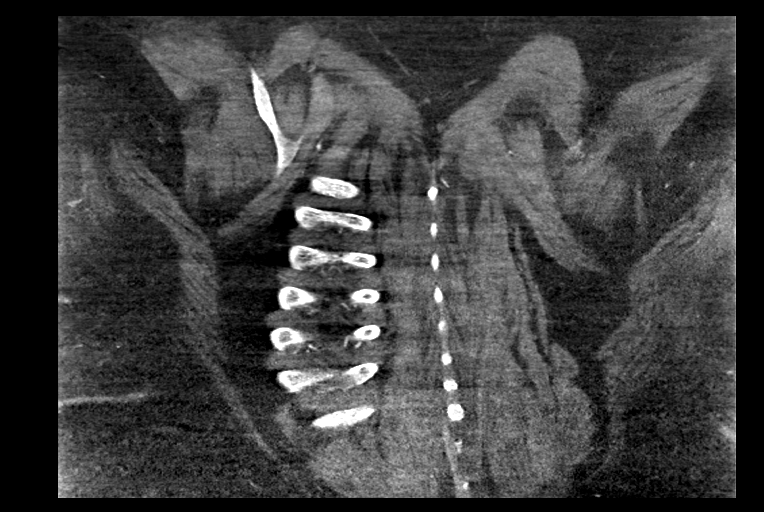

[15 of 46 positions shown; findings below may reference images not displayed]

RADIATION DOSE REDUCTION: This exam was performed according to the
departmental dose-optimization program which includes automated
exposure control, adjustment of the mA and/or kV according to
patient size and/or use of iterative reconstruction technique.

CONTRAST:  100mL OMNIPAQUE IOHEXOL 350 MG/ML SOLN
FINDINGS: Cardiovascular: Satisfactory opacification of the pulmonary arteries
to the segmental level. No evidence of pulmonary embolism. Heart is
enlarged. No pericardial effusion. There are atherosclerotic
calcifications of the aorta and coronary arteries. Right-sided
central venous catheter tip is in the right atrium. Left-sided
central venous catheter tip is at the brachiocephalic SVC junction.

Mediastinum/Nodes: No enlarged mediastinal, hilar, or axillary lymph
nodes. Esophagus is nondilated. Enteric tube tip is in the upper
thoracic esophagus near the level of the thoracic inlet. Visualized
thyroid gland is within normal limits.

Lungs/Pleura: There is a small amount of atelectasis and airspace
disease in the inferior left lower lobe. There are trace bilateral
pleural effusions. There are atelectatic changes in the right middle
lobe and right lower lobe. No evidence for pneumothorax. Trachea and
central airways are patent.

Upper Abdomen: No acute abnormality.

Musculoskeletal: No chest wall abnormality. No acute or significant
osseous findings.

Review of the MIP images confirms the above findings.
IMPRESSION: 1. No evidence for pulmonary embolism.
2. Small amount of atelectasis and airspace disease in the left
lower lobe worrisome for infection.
3. Trace bilateral pleural effusions.
4. Enteric tube tip is in the proximal thoracic esophagus. Recommend
repositioning.

Aortic Atherosclerosis (ZIEHP-TSV.V).

ADDENDUM:
These results were called by telephone at the time of interpretation
on 09/12/2021 at [DATE] to provider Dr. Maemura, who verbally
acknowledged these results.

*** End of Addendum ***
RADIATION DOSE REDUCTION: This exam was performed according to the
departmental dose-optimization program which includes automated
exposure control, adjustment of the mA and/or kV according to
patient size and/or use of iterative reconstruction technique.

CONTRAST:  100mL OMNIPAQUE IOHEXOL 350 MG/ML SOLN
FINDINGS: Cardiovascular: Satisfactory opacification of the pulmonary arteries
to the segmental level. No evidence of pulmonary embolism. Heart is
enlarged. No pericardial effusion. There are atherosclerotic
calcifications of the aorta and coronary arteries. Right-sided
central venous catheter tip is in the right atrium. Left-sided
central venous catheter tip is at the brachiocephalic SVC junction.

Mediastinum/Nodes: No enlarged mediastinal, hilar, or axillary lymph
nodes. Esophagus is nondilated. Enteric tube tip is in the upper
thoracic esophagus near the level of the thoracic inlet. Visualized
thyroid gland is within normal limits.

Lungs/Pleura: There is a small amount of atelectasis and airspace
disease in the inferior left lower lobe. There are trace bilateral
pleural effusions. There are atelectatic changes in the right middle
lobe and right lower lobe. No evidence for pneumothorax. Trachea and
central airways are patent.

Upper Abdomen: No acute abnormality.

Musculoskeletal: No chest wall abnormality. No acute or significant
osseous findings.

Review of the MIP images confirms the above findings.
IMPRESSION: 1. No evidence for pulmonary embolism.
2. Small amount of atelectasis and airspace disease in the left
lower lobe worrisome for infection.
3. Trace bilateral pleural effusions.
4. Enteric tube tip is in the proximal thoracic esophagus. Recommend
repositioning.

Aortic Atherosclerosis (ZIEHP-TSV.V).

## 2024-02-23 IMAGING — DX DG ABDOMEN 1V
1 series · 2 of 2 positions shown · non-contrast
Comparison: 09/10/2021

CLINICAL DATA: Nasogastric tube placement

EXAM:
ABDOMEN - 1 VIEW

[Series 1: abdomen · 0.14mm/px · 2 of 2 slices shown]
[im 1/2]
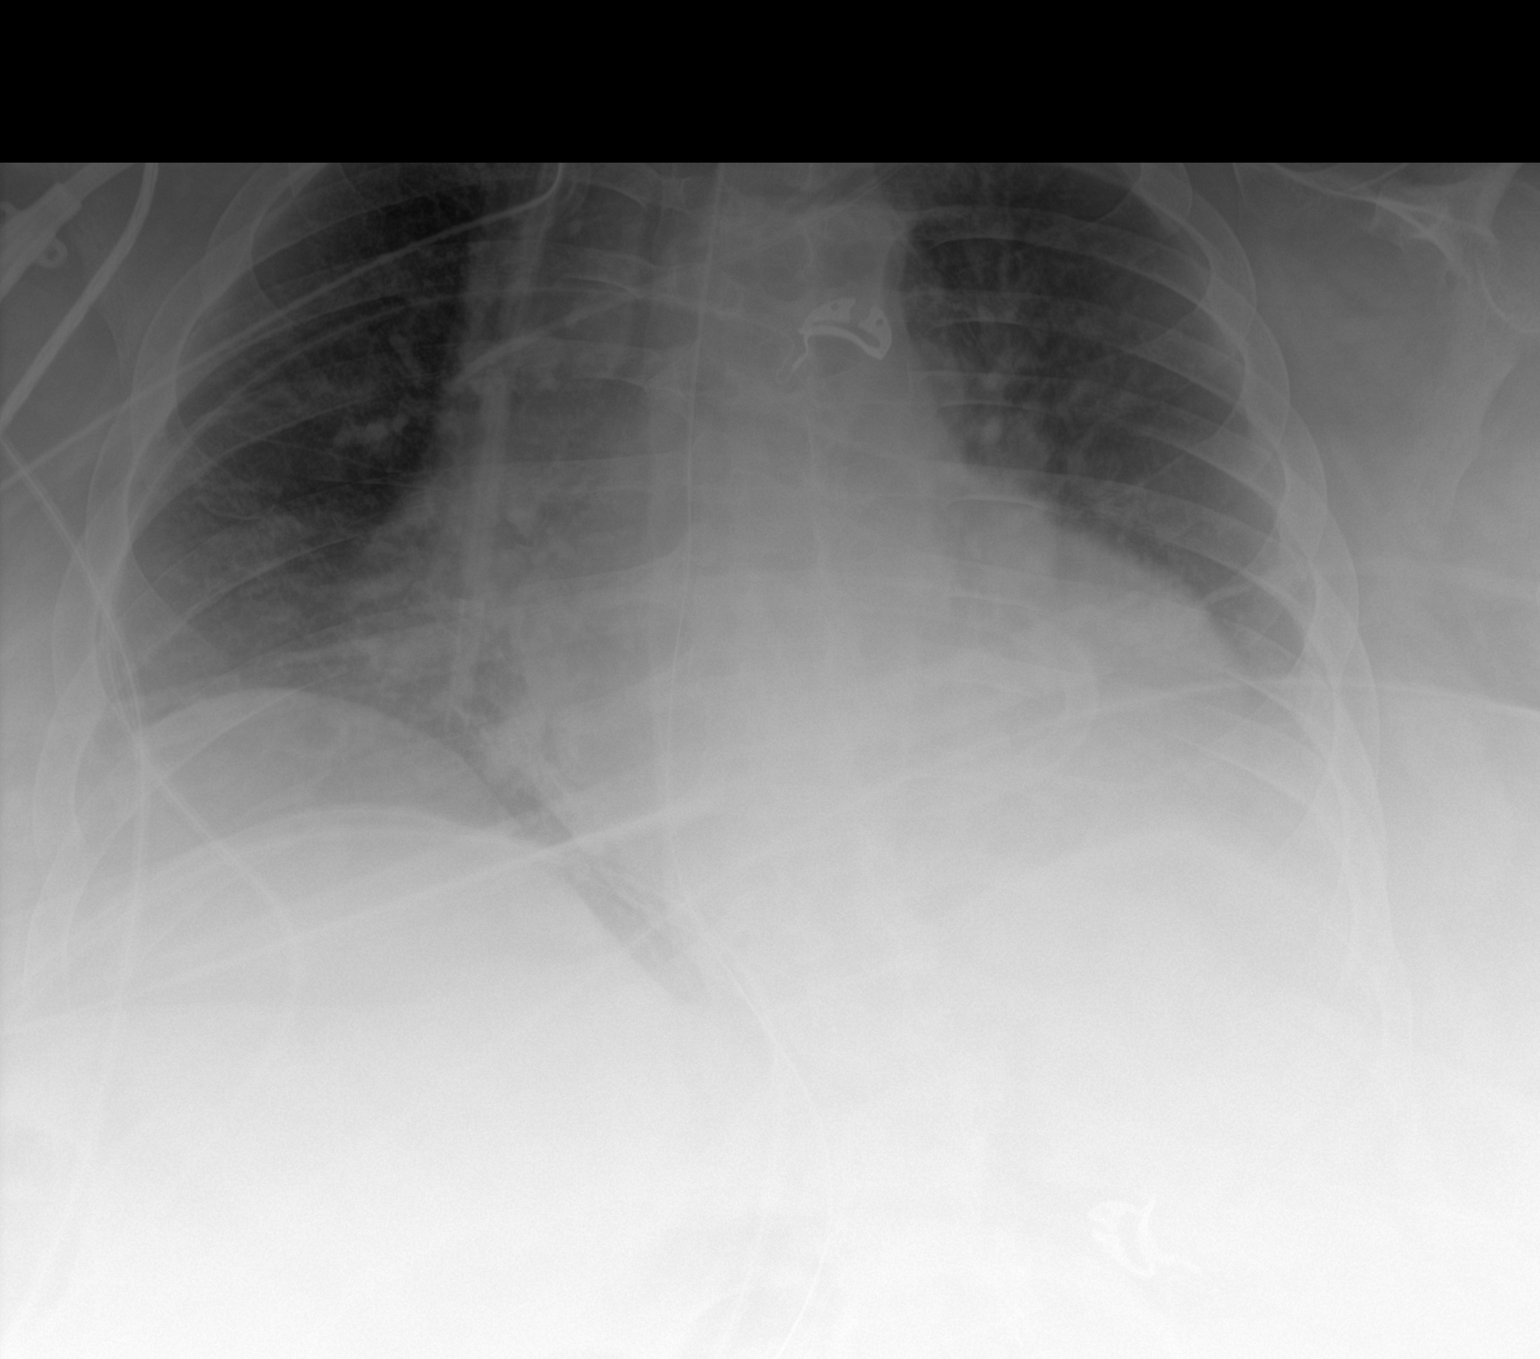
[im 2/2]
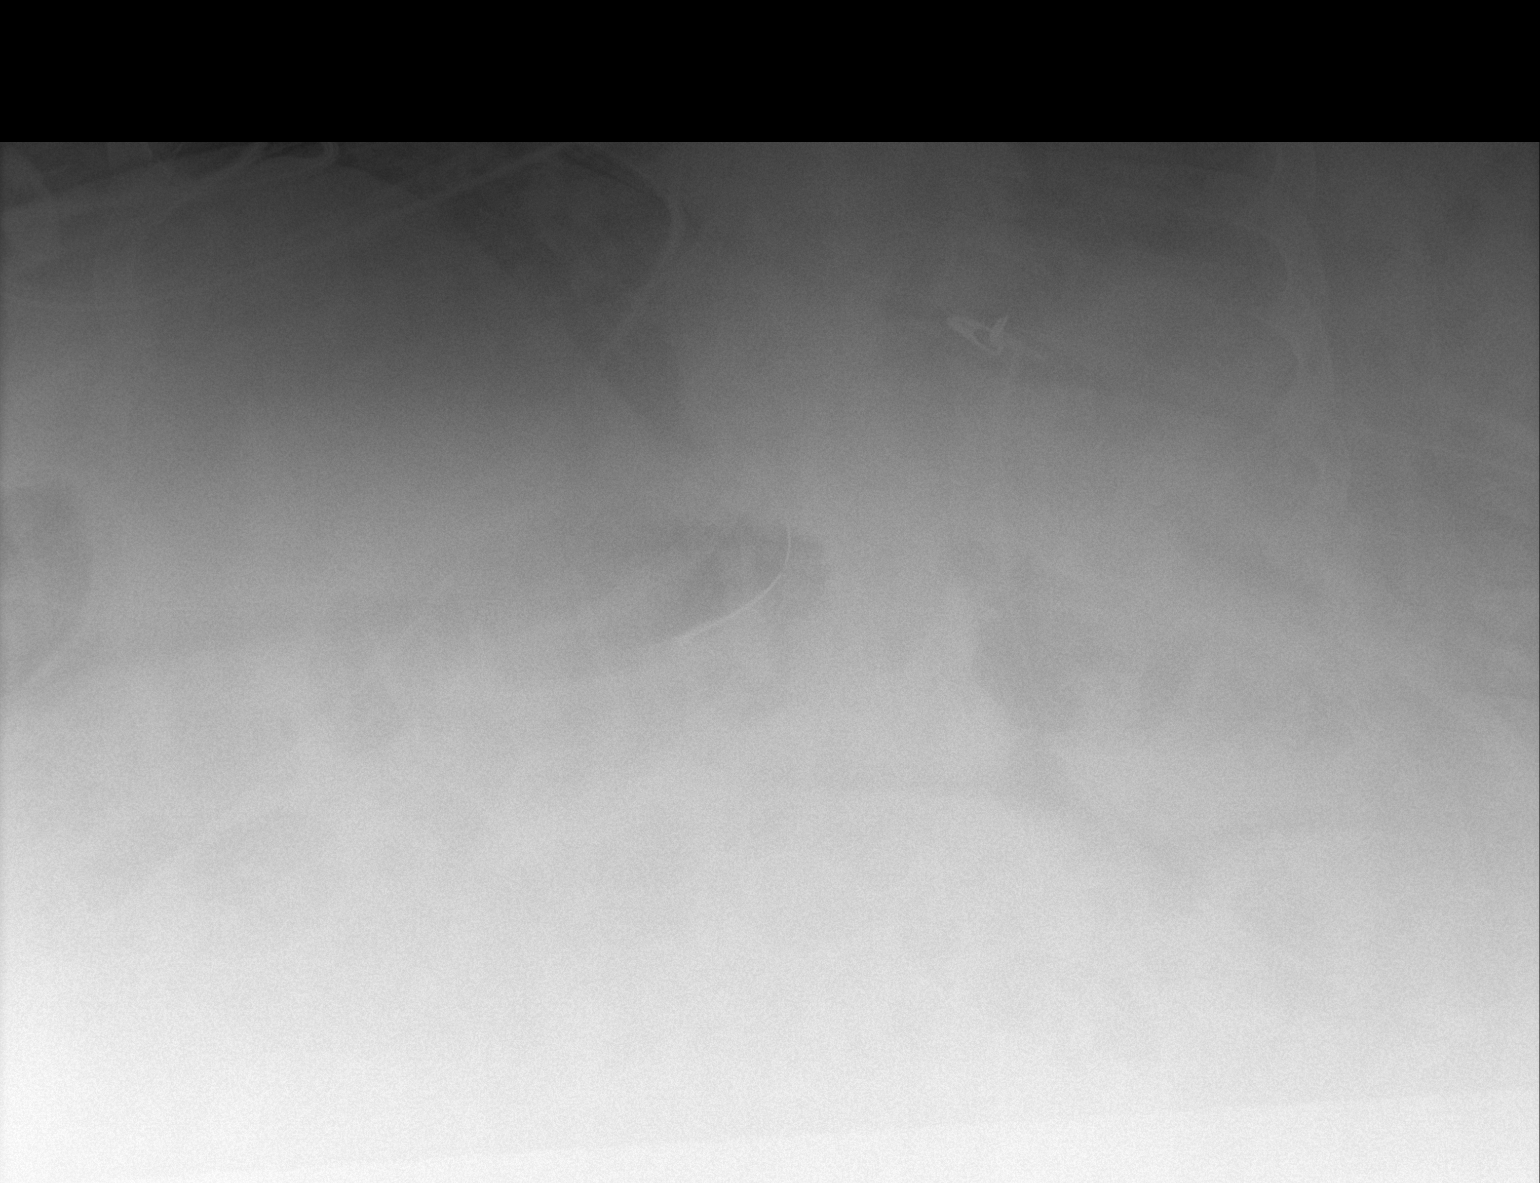

[2 of 2 positions shown; findings below may reference images not displayed]

FINDINGS: One view of the lower chest and upper abdomen. One view of the upper
abdomen. Nasogastric tube is followed to the level of the gastric
body, not visualized more distally. Expected limitations secondary
to patient morbid obesity. No gross free intraperitoneal air.
Cardiomegaly with probable layering bilateral pleural effusions.
Dialysis catheter tip at low SVC. Left subclavian line tip at high
SVC.
IMPRESSION: Although the nasogastric tube is difficult to follow distally,
likely terminates at the gastric body.
